# Patient Record
Sex: Female | Born: 1990 | Race: Black or African American | Hispanic: No | Marital: Single | Smoking: Current some day smoker
Health system: Southern US, Community
[De-identification: ages and names within clinical notes are randomized; demographics above are authoritative.]

## PROBLEM LIST (undated history)

## (undated) ENCOUNTER — Inpatient Hospital Stay (HOSPITAL_COMMUNITY): Payer: Self-pay

## (undated) ENCOUNTER — Emergency Department (HOSPITAL_COMMUNITY)
Discharge status: Absent without leave | Payer: Medicare Other | Attending: Emergency Medicine | Admitting: Emergency Medicine

## (undated) DIAGNOSIS — F329 Major depressive disorder, single episode, unspecified: Secondary | ICD-10-CM

## (undated) DIAGNOSIS — R0600 Dyspnea, unspecified: Secondary | ICD-10-CM

## (undated) DIAGNOSIS — D5700 Hb-SS disease with crisis, unspecified: Secondary | ICD-10-CM

## (undated) DIAGNOSIS — O039 Complete or unspecified spontaneous abortion without complication: Secondary | ICD-10-CM

## (undated) DIAGNOSIS — D571 Sickle-cell disease without crisis: Secondary | ICD-10-CM

## (undated) DIAGNOSIS — R4586 Emotional lability: Secondary | ICD-10-CM

## (undated) DIAGNOSIS — A6000 Herpesviral infection of urogenital system, unspecified: Secondary | ICD-10-CM

## (undated) DIAGNOSIS — D473 Essential (hemorrhagic) thrombocythemia: Secondary | ICD-10-CM

## (undated) DIAGNOSIS — G8929 Other chronic pain: Secondary | ICD-10-CM

## (undated) DIAGNOSIS — F633 Trichotillomania: Secondary | ICD-10-CM

## (undated) DIAGNOSIS — K219 Gastro-esophageal reflux disease without esophagitis: Secondary | ICD-10-CM

## (undated) DIAGNOSIS — M549 Dorsalgia, unspecified: Secondary | ICD-10-CM

## (undated) DIAGNOSIS — IMO0001 Reserved for inherently not codable concepts without codable children: Secondary | ICD-10-CM

## (undated) DIAGNOSIS — G43909 Migraine, unspecified, not intractable, without status migrainosus: Secondary | ICD-10-CM

## (undated) DIAGNOSIS — Z5189 Encounter for other specified aftercare: Secondary | ICD-10-CM

## (undated) DIAGNOSIS — R0609 Other forms of dyspnea: Secondary | ICD-10-CM

## (undated) HISTORY — DX: Hb-SS disease with crisis, unspecified: D57.00

---
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
## 2011-01-06 DIAGNOSIS — Z7189 Other specified counseling: Secondary | ICD-10-CM | POA: Insufficient documentation

## 2011-01-06 DIAGNOSIS — F329 Major depressive disorder, single episode, unspecified: Secondary | ICD-10-CM | POA: Insufficient documentation

## 2011-01-06 DIAGNOSIS — F32A Depression, unspecified: Secondary | ICD-10-CM

## 2011-01-06 HISTORY — DX: Depression, unspecified: F32.A

## 2011-01-06 NOTE — Progress Notes (Signed)
  Subjective:    Patient ID: [name], female    DOB: [birth year], 20 y.o.   MRN: [id number]  HPI Desired pregnancy- Pt states she has actively trying to get pregnant, has stopped smoking, has stopped taking hydroxyurea.  Has not discussed her plans for pregnancy with her heme/onc physician.  States that she just feels ready for a baby and that she wants her current boyfriend to be her baby's daddy.  Pt's boyfriend here with her at today's appointment.  Wanted to come discuss pregnancy planning and to get a pregnancy test.    cigarrette smoking- Stopped smoking x 1 month  Sickle cell- Saw heme/onc md on July 23rd- Dr. Mikel Cella.  Said no changes in regimen.  Did not discuss the fact that she has stopped hydroxyurea with md.  Has not had any lab checks with her heme/onc physician.  Last SS crisis in July 24th which led to a hospitalization.    Review of Systems    no fever.  No dizziness. No chest pain.   No body pain.  Objective:   Physical Exam  Constitutional: She is oriented to person, place, and time. She appears well-developed and well-nourished.  Cardiovascular: Normal rate and regular rhythm.   No murmur heard. Pulmonary/Chest: Effort normal and breath sounds normal. No respiratory distress. She has no wheezes.  Abdominal: Soft. She exhibits no distension.  Musculoskeletal: She exhibits no edema.  Neurological: She is alert and oriented to person, place, and time.  Skin: No rash noted.  Psychiatric: She has a normal mood and affect. Her behavior is normal.          Assessment & Plan:

## 2011-01-06 NOTE — Assessment & Plan Note (Signed)
Pt currently has stopped smoking since she is desiring pregnancy.

## 2011-01-06 NOTE — Assessment & Plan Note (Signed)
Pt encouraged to get a prenatal counseling appointment with Dr. Mikel Cella heme/onc.

## 2011-01-06 NOTE — Assessment & Plan Note (Signed)
Pt encouraged to delay pregnancy until SS disease better controlled.  Encouraged consult with her heme/onc physician to discuss pros/cons and ways to best manage pregnancy in the setting of sickle cell.  Also encouraged her to discuss her stopping hydroyurea with heme/onc physician.  Pt states she will call to get her appointment moved up from sept.  Also encouraged pt to think about if she is ready for the responsibility of pregnancy and the life changes and stress that this brings.  Encouraged her to take depo shot or other form of birth control until prenatal planning is complete.  Pt refuses.  Pt encouraged to take prenatal vitamin.

## 2011-01-14 ENCOUNTER — Telehealth: Payer: Self-pay | Admitting: *Deleted

## 2011-01-14 ENCOUNTER — Emergency Department (HOSPITAL_COMMUNITY)
Admission: EM | Admit: 2011-01-14 | Discharge: 2011-01-15 | Disposition: A | Discharge status: Other Acute Inpatient Hospital | Payer: Medicaid Other | Source: Home / Self Care | Attending: Emergency Medicine | Admitting: Emergency Medicine

## 2011-01-14 DIAGNOSIS — R0602 Shortness of breath: Secondary | ICD-10-CM | POA: Insufficient documentation

## 2011-01-14 DIAGNOSIS — O99891 Other specified diseases and conditions complicating pregnancy: Secondary | ICD-10-CM | POA: Insufficient documentation

## 2011-01-14 DIAGNOSIS — O99019 Anemia complicating pregnancy, unspecified trimester: Secondary | ICD-10-CM | POA: Insufficient documentation

## 2011-01-14 DIAGNOSIS — R51 Headache: Secondary | ICD-10-CM | POA: Insufficient documentation

## 2011-01-14 DIAGNOSIS — R109 Unspecified abdominal pain: Secondary | ICD-10-CM | POA: Insufficient documentation

## 2011-01-14 DIAGNOSIS — D571 Sickle-cell disease without crisis: Secondary | ICD-10-CM | POA: Insufficient documentation

## 2011-01-14 LAB — CBC
HCT: 24.3 % — ABNORMAL LOW (ref 36.0–46.0)
Hemoglobin: 8.5 g/dL — ABNORMAL LOW (ref 12.0–15.0)
RBC: 2.79 MIL/uL — ABNORMAL LOW (ref 3.87–5.11)
WBC: 13.8 10*3/uL — ABNORMAL HIGH (ref 4.0–10.5)

## 2011-01-14 LAB — COMPREHENSIVE METABOLIC PANEL
ALT: 11 U/L (ref 0–35)
Alkaline Phosphatase: 54 U/L (ref 39–117)
BUN: 7 mg/dL (ref 6–23)
CO2: 25 mEq/L (ref 19–32)
Chloride: 102 mEq/L (ref 96–112)
GFR calc Af Amer: >60 mL/min (ref >60)
Glucose, Bld: 79 mg/dL (ref 70–99)
Potassium: 3.6 mEq/L (ref 3.5–5.1)
Total Bilirubin: 2.2 mg/dL — ABNORMAL HIGH (ref 0.3–1.2)

## 2011-01-14 LAB — URINALYSIS, ROUTINE W REFLEX MICROSCOPIC
Bilirubin Urine: NEGATIVE
Leukocytes, UA: NEGATIVE
Nitrite: NEGATIVE
Specific Gravity, Urine: 1.011 (ref 1.005–1.030)
pH: 5.5 (ref 5.0–8.0)

## 2011-01-14 LAB — DIFFERENTIAL
Basophils Absolute: 0.1 10*3/uL (ref 0.0–0.1)
Lymphocytes Relative: 27 % (ref 12–46)
Monocytes Absolute: 1.6 10*3/uL — ABNORMAL HIGH (ref 0.1–1.0)
Neutro Abs: 8.1 10*3/uL — ABNORMAL HIGH (ref 1.7–7.7)
Neutrophils Relative %: 59 % (ref 43–77)

## 2011-01-14 LAB — POCT PREGNANCY, URINE: Preg Test, Ur: POSITIVE

## 2011-01-14 LAB — WET PREP, GENITAL: Yeast Wet Prep HPF POC: NONE SEEN

## 2011-01-14 NOTE — Telephone Encounter (Signed)
Message copied by Arlyss Repress on Fri Jan 14, 2011 11:44 AM ------      Message from: Lorita Officer      Created: Thu Jan 06, 2011  4:33 PM                   ----- Message -----         From: [name]         Sent: 01/06/2011   3:31 PM           To: [facility] Admin            Can you please request records from [facility]--  Dr. Mikel Cella for pt's last appointment in July.

## 2011-01-14 NOTE — Telephone Encounter (Signed)
Pt has upcoming appt with dr.caviness. i tried to call the pt several times, but none of her phones (3) accept incoming calls. Pt needs to sign ROI, so we can get records from [facility] Marland KitchenArlyss Repress

## 2011-01-15 ENCOUNTER — Inpatient Hospital Stay (HOSPITAL_COMMUNITY)
Admission: EM | Admit: 2011-01-15 | Discharge: 2011-01-17 | DRG: 781 | Disposition: A | Discharge status: Home / Self Care | Payer: Medicaid Other | Source: Ambulatory Visit | Attending: Family Medicine | Admitting: Family Medicine

## 2011-01-15 ENCOUNTER — Inpatient Hospital Stay (HOSPITAL_COMMUNITY): Payer: Medicaid Other

## 2011-01-15 ENCOUNTER — Emergency Department (HOSPITAL_COMMUNITY): Payer: Medicaid Other

## 2011-01-15 DIAGNOSIS — D5700 Hb-SS disease with crisis, unspecified: Secondary | ICD-10-CM | POA: Diagnosis present

## 2011-01-15 DIAGNOSIS — Z331 Pregnant state, incidental: Secondary | ICD-10-CM

## 2011-01-15 DIAGNOSIS — Z87891 Personal history of nicotine dependence: Secondary | ICD-10-CM

## 2011-01-15 DIAGNOSIS — Z832 Family history of diseases of the blood and blood-forming organs and certain disorders involving the immune mechanism: Secondary | ICD-10-CM

## 2011-01-15 DIAGNOSIS — O99019 Anemia complicating pregnancy, unspecified trimester: Principal | ICD-10-CM | POA: Diagnosis present

## 2011-01-15 LAB — CBC
HCT: 20.2 % — ABNORMAL LOW (ref 36.0–46.0)
HCT: 19.5 % — ABNORMAL LOW (ref 36.0–46.0)
Hemoglobin: 7 g/dL — ABNORMAL LOW (ref 12.0–15.0)
MCH: 29.7 pg (ref 26.0–34.0)
MCHC: 35.1 g/dL (ref 30.0–36.0)
MCV: 84.5 fL (ref 78.0–100.0)
Platelets: 487 10*3/uL — ABNORMAL HIGH (ref 150–400)
RDW: 18.1 % — ABNORMAL HIGH (ref 11.5–15.5)
RDW: 18.7 % — ABNORMAL HIGH (ref 11.5–15.5)
WBC: 13.2 10*3/uL — ABNORMAL HIGH (ref 4.0–10.5)

## 2011-01-15 LAB — CARDIAC PANEL(CRET KIN+CKTOT+MB+TROPI)
Relative Index: INVALID (ref 0.0–2.5)
Total CK: 62 U/L (ref 7–177)
Troponin I: <0.3 ng/mL (ref <0.30)

## 2011-01-15 LAB — BASIC METABOLIC PANEL
CO2: 25 mEq/L (ref 19–32)
Calcium: 8.3 mg/dL — ABNORMAL LOW (ref 8.4–10.5)
Chloride: 106 mEq/L (ref 96–112)
Creatinine, Ser: <0.47 mg/dL — ABNORMAL LOW (ref 0.50–1.10)
Glucose, Bld: 92 mg/dL (ref 70–99)

## 2011-01-15 LAB — COMPREHENSIVE METABOLIC PANEL
ALT: 11 U/L (ref 0–35)
Albumin: 3.7 g/dL (ref 3.5–5.2)
Alkaline Phosphatase: 50 U/L (ref 39–117)
BUN: 4 mg/dL — ABNORMAL LOW (ref 6–23)
Chloride: 105 mEq/L (ref 96–112)
Potassium: 3.4 mEq/L — ABNORMAL LOW (ref 3.5–5.1)
Sodium: 137 mEq/L (ref 135–145)
Total Bilirubin: 3.2 mg/dL — ABNORMAL HIGH (ref 0.3–1.2)
Total Protein: 6.4 g/dL (ref 6.0–8.3)

## 2011-01-15 LAB — LIPASE, BLOOD: Lipase: 26 U/L (ref 11–59)

## 2011-01-15 NOTE — H&P (Signed)
[name] is an 20 y.o. female.   Chief Complaint: Abdominal Pain Crisis HPI: [name] is 20 year old female with past medical history of Sickle cell anemia and multiple hospitalizations for pain crisis who presents with abdominal pain.  She says that the pain started yesterday afternoon, and she had nausea and vomiting.  By the time she was finishing her shift at [facility] last night, she was hurting badly, and went to the Emergency room.  She denies pain anywhere but in her abdomen, and says she has not had fevers or shortness of breath.  In the ER, the patient found out she was pregnant.  This was a planned pregnancy, she and her boyfriend have been trying to have a baby.  She is not taking any prenatal vitamins yet.  She is not smoking, using any illegal drugs, or drinking alcohol.  Patient says that her lower back hurts all the time, even when she is not having a pain crisis.  She says no injury to it, just feels sore often.    Past Medical History  Diagnosis Date  . Sickle cell anemia with crisis     Past Surgical History  Procedure Date  . Cholecystectomy     Family History: Other family members with trait, no one else has sickle cell.  Social History:  reports that she has quit smoking. Her smoking use included Cigars. She does not have any smokeless tobacco history on file. She reports that she does not drink alcohol or use illicit drugs.  Allergies:  Allergies  Allergen Reactions  . Toradol     Some itching, give with benedryl    No current facility-administered medications on file as of 01/15/2011.   Medications Prior to Admission  Medication Sig Dispense Refill  . buPROPion (WELLBUTRIN XL) 150 MG 24 hr tablet Take 1 tablet (150 mg total) by mouth every morning.  30 tablet  2  . norethindrone-ethinyl estradiol (ORTHO-NOVUM 1-35 TAB,NORTREL 1-35 TAB) 1-35 MG-MCG per tablet Take 1 tablet by mouth daily.          Results for orders placed during the hospital  encounter of 01/15/11 (from the past 48 hour(s))  CBC     Status: Abnormal   Collection Time   01/15/11 11:28 AM      Component Value Range Comment   WBC 11.7 (*) 4.0 - 10.5 (K/uL)    RBC 2.39 (*) 3.87 - 5.11 (MIL/uL)    Hemoglobin 7.1 (*) 12.0 - 15.0 (g/dL)    HCT 16.1 (*) 09.6 - 46.0 (%)    MCV 84.5  78.0 - 100.0 (fL)    MCH 29.7  26.0 - 34.0 (pg)    MCHC 35.1  30.0 - 36.0 (g/dL)    RDW 04.5 (*) 40.9 - 15.5 (%)    Platelets 487 (*) 150 - 400 (K/uL)   RETICULOCYTES     Status: Abnormal   Collection Time   01/15/11 11:28 AM      Component Value Range Comment   Retic Ct Pct 22.4 (*) 0.4 - 3.1 (%)    RBC. 2.39 (*) 3.87 - 5.11 (MIL/uL)    Retic Count, Manual 535.4 (*) 19.0 - 186.0 (K/uL)   BASIC METABOLIC PANEL     Status: Abnormal   Collection Time   01/15/11 11:28 AM      Component Value Range Comment   Sodium 138  135 - 145 (mEq/L)    Potassium 3.2 (*) 3.5 - 5.1 (mEq/L)    Chloride  106  96 - 112 (mEq/L)    CO2 25  19 - 32 (mEq/L)    Glucose, Bld 92  70 - 99 (mg/dL)    BUN 3 (*) 6 - 23 (mg/dL)    Creatinine, Ser <1.61 (*) 0.50 - 1.10 (mg/dL) ICTERUS AT THIS LEVEL MAY AFFECT RESULT   Calcium 8.3 (*) 8.4 - 10.5 (mg/dL)    GFR calc non Af Amer NOT CALCULATED  >60 (mL/min)    GFR calc Af Amer NOT CALCULATED  >60 (mL/min)    US Ob Transvaginal  01/15/2011  *RADIOLOGY REPORT*  Clinical Data: Left-sided pain.  Ectopic.  Negative pregnancy test on 12/31/2010.  TRANSVAGINAL OB ULTRASOUND  Technique:  Transvaginal ultrasound was performed for evaluation of the gestation as well as the maternal uterus and adnexal regions.  Comparison: 12/08/2010  Findings: Thickened endometrial stripe with decidual reaction noted.  There is a small intrauterine gestational sac demonstrated with a mean sac diameter of about 2.4 mm consistent with estimated gestational age of [redacted] weeks 6 days and estimated delivery date of 09/18/2011.  Small amount of fluid in the endometrium suggesting hemorrhage.  This may represent  implantation hemorrhage in a an early intrauterine gestation.  Fetal pole and yolk sac are not visualized, as the gestational sac is too small.  The left ovary measures 3 x 1.5 by 1.6 cm and contains normal follicular changes. The right ovary measures 3.9 x 2.5 by 1.9 cm and contains a dominant cyst measuring about 1.9 x 2.4 x 1.9 cm.  This likely represents the corpus luteum cyst.  Minimal free fluid in the pelvis.  IMPRESSION: Tiny intrauterine cyst with decidual reaction and small hemorrhage suggesting early pregnancy.  Estimated gestational age by sac size is 4 weeks 6 days.  The fetal pole and yolk sac are not visualized yet.  Follow-up with serial quantitative beta HCG levels may be useful in confirming normal early pregnancy.  Original Report Authenticated By: Marlon Pel, M.D.    Review of Systems  Constitutional: Negative for fever.  Eyes: Negative for blurred vision.  Respiratory: Negative for shortness of breath.   Cardiovascular: Negative for chest pain.  Gastrointestinal: Positive for nausea, vomiting, abdominal pain and constipation. Negative for blood in stool.  Genitourinary: Negative for frequency.  Musculoskeletal: Positive for back pain.  Skin: Negative for rash.  Neurological: Negative for dizziness and headaches.  Endo/Heme/Allergies: Does not bruise/bleed easily.  Psychiatric/Behavioral: Negative for depression.    Last menstrual period 12/14/2010. Physical Exam  Vitals reviewed. Constitutional: She is oriented to person, place, and time. She appears well-developed and well-nourished. She appears distressed.  HENT:  Head: Normocephalic.  Eyes: EOM are normal.  Neck: Neck supple. No JVD present. No thyromegaly present.  Cardiovascular: Normal rate, regular rhythm and normal heart sounds.   Respiratory: Effort normal and breath sounds normal.  GI: Soft. Bowel sounds are normal. She exhibits no mass. There is tenderness.  Musculoskeletal: She exhibits no edema  and no tenderness.  Lymphadenopathy:    She has no cervical adenopathy.  Neurological: She is alert and oriented to person, place, and time.  Skin: Skin is warm and dry. No rash noted.  Psychiatric: She has a normal mood and affect.     Assessment/Plan 20 year old female with pmhx of Sickle Cell anemia who presents with Sickle Cell pain crisis, found to be 4 weeks and 6 days pregnant.   1) Heme- Hemoglobin slighlty low, 7.1, but no indication to transfuse at this time.  Will continue monitoring. 2) Pain- Will try to minimize narcotics as much as possible in event of pain crisis.  Will give IV dilaudid PRN instead of a PCA.  No NSAIDS due to pregnancy. 3) Pregnancy- will give pregnancy safe medications as much as possible and try to minimize narcotics.  Start prenatal vitamin.  Will plan on referral to [facility] at the [facility] for outpatient management of Pregnancy.   4) Back pain- patient would likely benefit from physical therapy.  5) FEN/GI- will give NS for hydration in setting of vaso-occlusive pain crisis, will allow PO diet as tolerated, protonix, and zofran PRN. 6) DVT PPX- Lovonox 30 SQ BID 7) Disposition- pending pain crisis improvement.   CHAMBERLAIN,RACHEL 01/15/2011, 3:53 PM

## 2011-01-16 LAB — BASIC METABOLIC PANEL
BUN: 3 mg/dL — ABNORMAL LOW (ref 6–23)
CO2: 24 mEq/L (ref 19–32)
Calcium: 9 mg/dL (ref 8.4–10.5)
Creatinine, Ser: <0.47 mg/dL — ABNORMAL LOW (ref 0.50–1.10)
Glucose, Bld: 91 mg/dL (ref 70–99)
Sodium: 137 mEq/L (ref 135–145)

## 2011-01-16 LAB — CBC
HCT: 21 % — ABNORMAL LOW (ref 36.0–46.0)
Hemoglobin: 7.5 g/dL — ABNORMAL LOW (ref 12.0–15.0)
MCH: 30.2 pg (ref 26.0–34.0)
MCHC: 35.7 g/dL (ref 30.0–36.0)
MCV: 84.7 fL (ref 78.0–100.0)
RBC: 2.48 MIL/uL — ABNORMAL LOW (ref 3.87–5.11)

## 2011-01-17 LAB — CBC
MCH: 30.8 pg (ref 26.0–34.0)
MCHC: 35.7 g/dL (ref 30.0–36.0)
MCV: 86.3 fL (ref 78.0–100.0)
Platelets: 471 10*3/uL — ABNORMAL HIGH (ref 150–400)
RBC: 2.27 MIL/uL — ABNORMAL LOW (ref 3.87–5.11)
RDW: 19.8 % — ABNORMAL HIGH (ref 11.5–15.5)

## 2011-01-17 LAB — BASIC METABOLIC PANEL
CO2: 22 mEq/L (ref 19–32)
Calcium: 8.5 mg/dL (ref 8.4–10.5)
Creatinine, Ser: <0.47 mg/dL — ABNORMAL LOW (ref 0.50–1.10)
Sodium: 136 mEq/L (ref 135–145)

## 2011-01-17 IMAGING — CT CT ABD-PELV W/O CM
2 of 4 series · 14 of 32 positions shown, 19 images · IV contrast (water/omni)
Comparison: CT abdomen and pelvis 05/24/2006.

CLINICAL DATA: Possible sickle cell crisis.  Diffuse abdominal
pain, worst in the lower abdomen.  Nausea and vomiting.

CT ABDOMEN AND PELVIS WITHOUT CONTRAST/6755:
TECHNIQUE: Multidetector CT imaging of the abdomen and pelvis was
performed following the standard protocol without intravenous
contrast.

[Series 2: routine abdomen · axial · 0.88mm/px · z∈[-430,-86]mm · 7 of 93 slices shown, 12 images]
[im 12/93  soft-tissue]
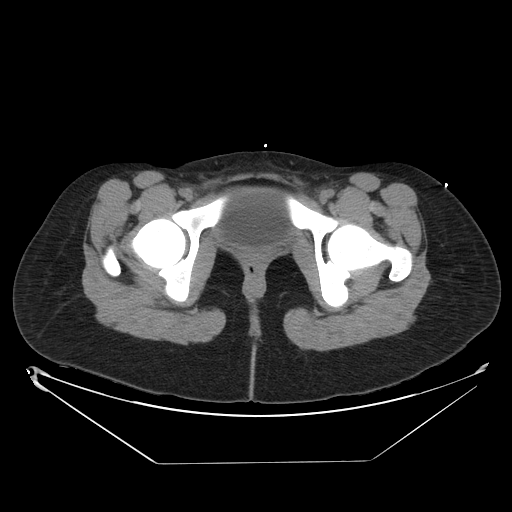
[im 12/93  bone]
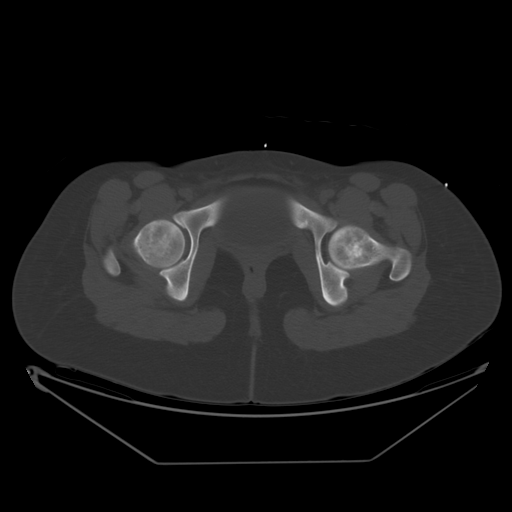
[im 24/93  soft-tissue]
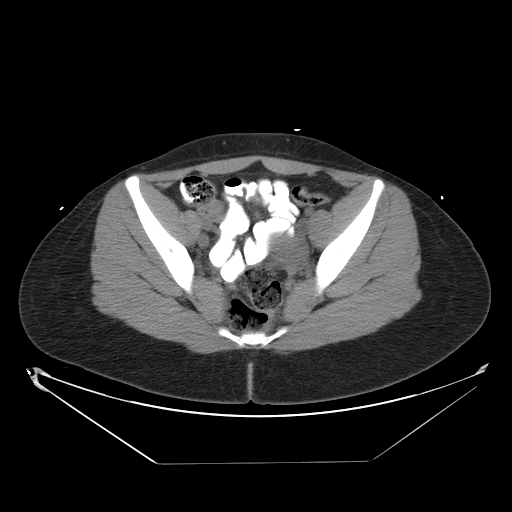
[im 35/93  soft-tissue]
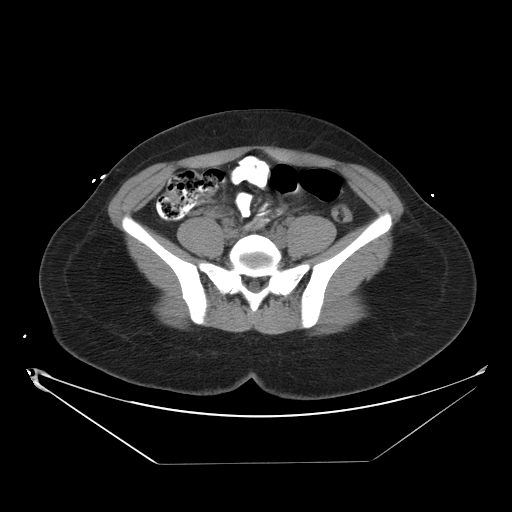
[im 47/93  soft-tissue]
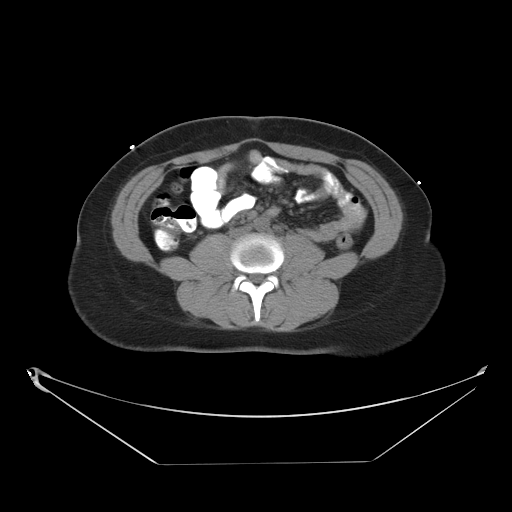
[im 47/93  lung]
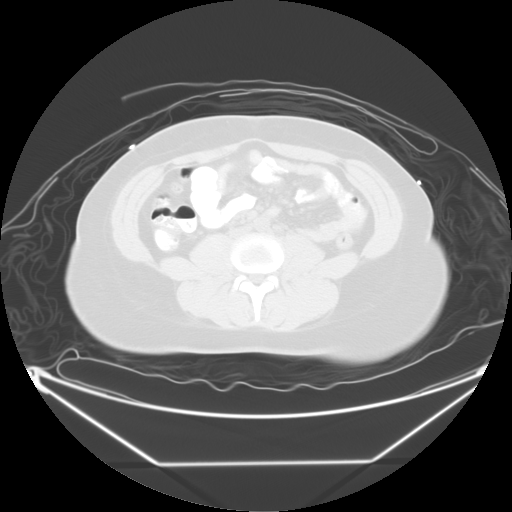
[im 58/93  soft-tissue]
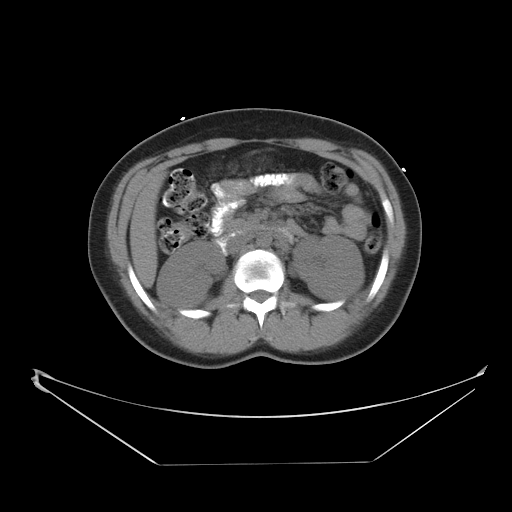
[im 58/93  lung]
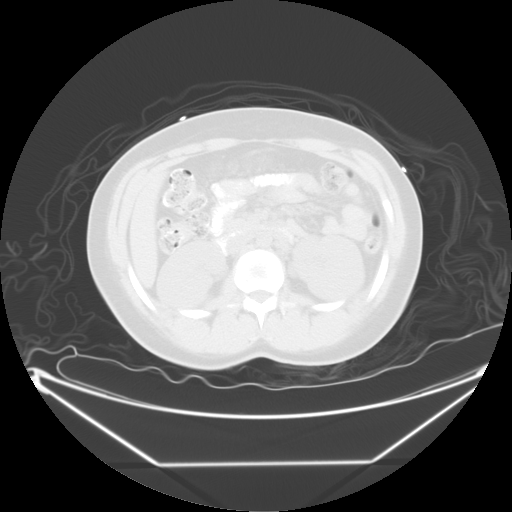
[im 70/93  soft-tissue]
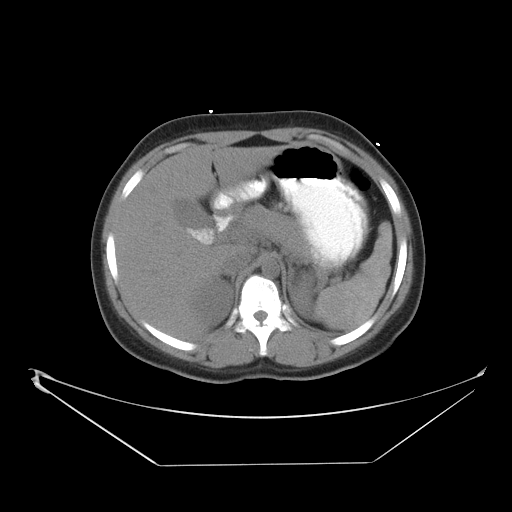
[im 70/93  lung]
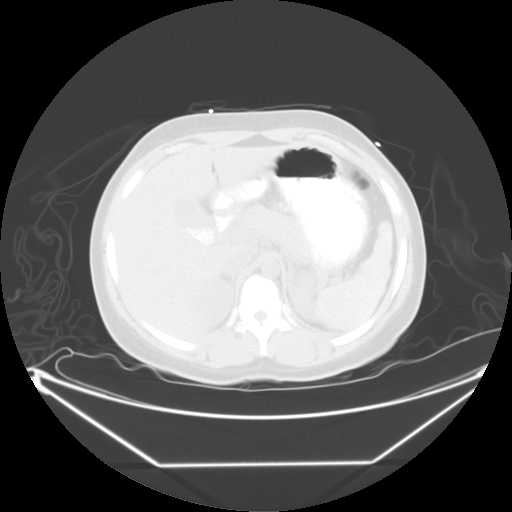
[im 81/93  soft-tissue]
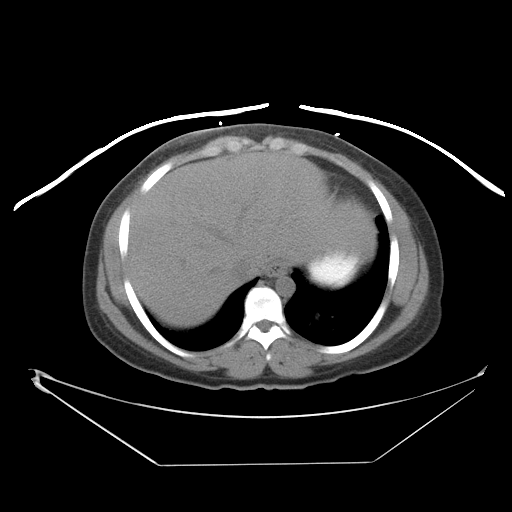
[im 81/93  lung]
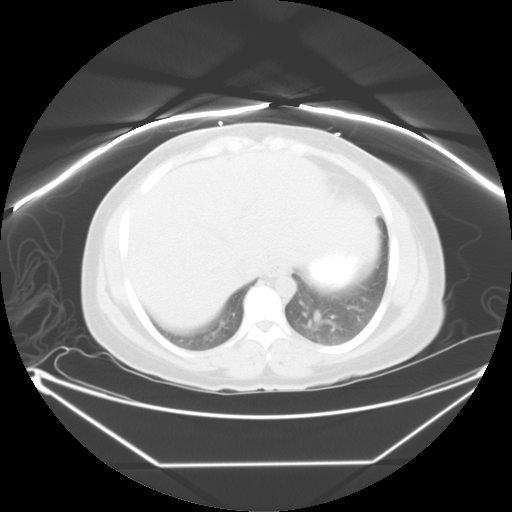

[Series 400: sag · sagittal · 0.92mm/px · 7 of 134 slices shown]
[im 13/134  soft-tissue]
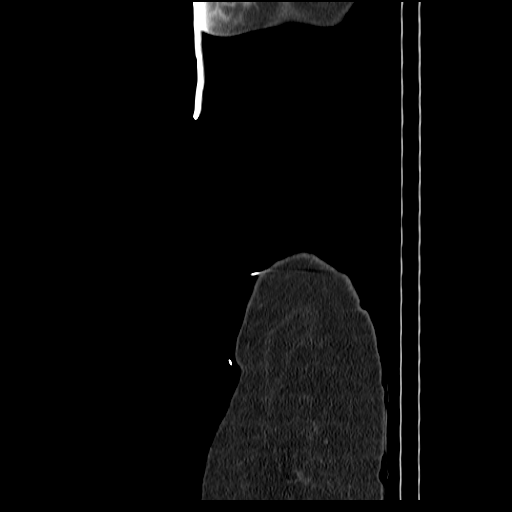
[im 25/134  soft-tissue]
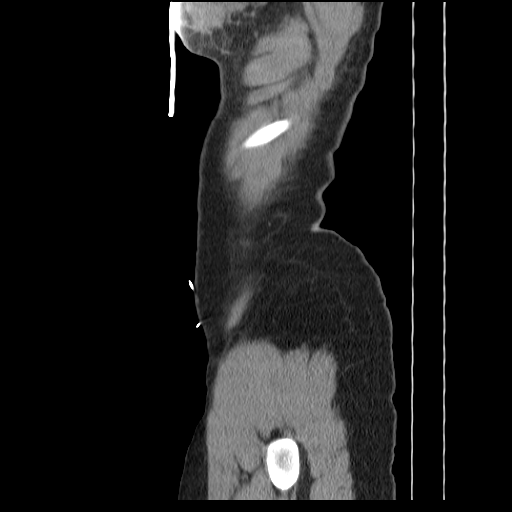
[im 49/134  soft-tissue]
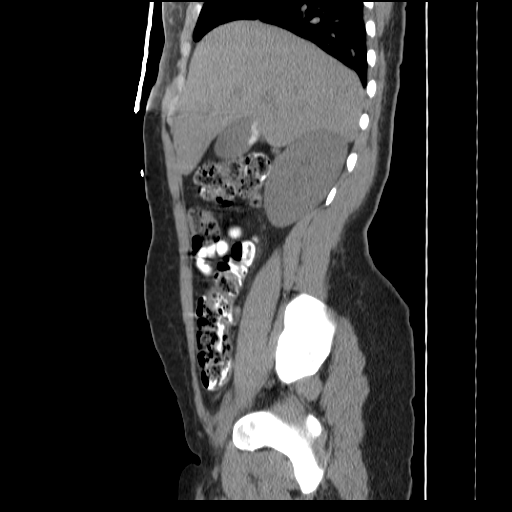
[im 61/134  soft-tissue]
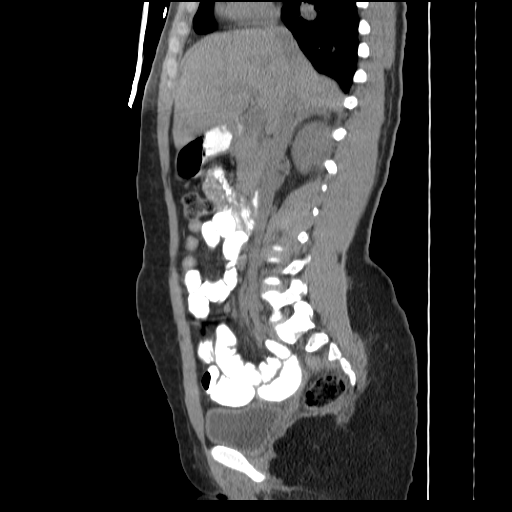
[im 73/134  soft-tissue]
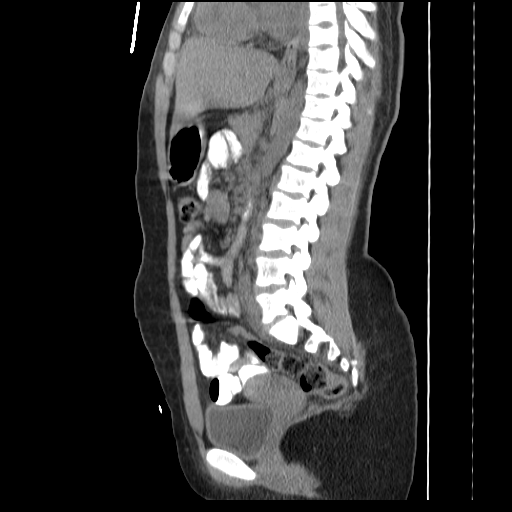
[im 85/134  soft-tissue]
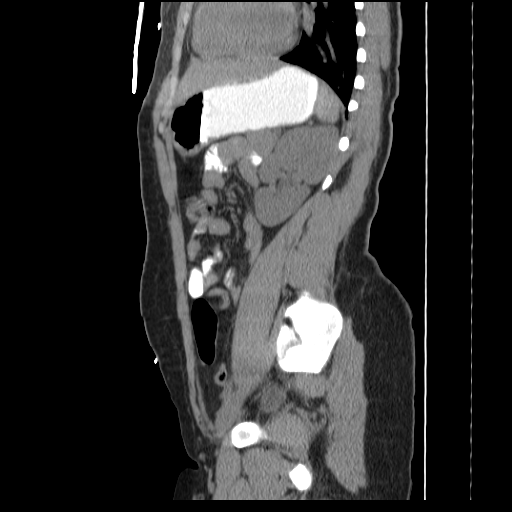
[im 109/134  soft-tissue]
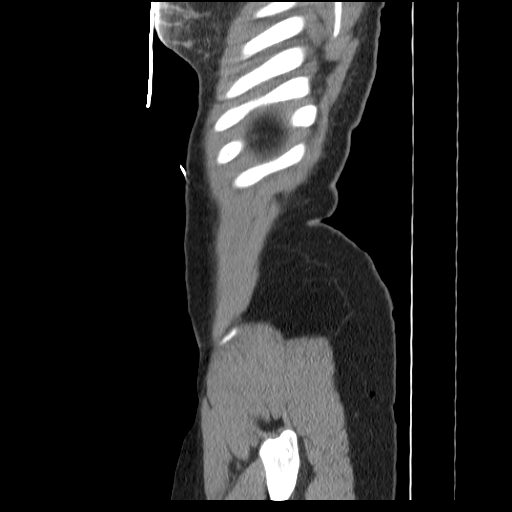

[14 of 32 positions shown; findings below may reference images not displayed]

FINDINGS: Normal low dose unenhanced appearance of the liver,
pancreas, adrenal glands, and kidneys.  Heterogeneous spleen with
irregular contour, consistent with old infarcts.  Gallstones within
the gallbladder which is otherwise unremarkable.  No biliary ductal
dilation.  No visible bile duct stones.  Stomach, small bowel, and
colon normal in appearance.  Normal appendix in the right mid
pelvis.  No ascites.  No significant lymphadenopathy.

Uterus and ovaries unremarkable for age.  Urinary bladder
unremarkable.  The visualized lung bases clear.  Heart size upper
normal.  Bone window images demonstrate diffuse sickle osteopathy.
IMPRESSION: 1.  No acute abnormalities involving the abdomen or pelvis.
2.  Cholelithiasis without CT evidence of acute cholecystitis.
3.  Irregular spleen consistent with old infarcts.
4.  Sickle osteopathy.

## 2011-01-17 IMAGING — CR DG ABDOMEN ACUTE W/ 1V CHEST
3 series · 3 of 3 positions shown · non-contrast
Comparison: 02/01/2008

CLINICAL DATA: Abdominal pain, sickle cell pain.

ACUTE ABDOMEN SERIES (ABDOMEN 2 VIEW & CHEST 1 VIEW)

[w chest pa]
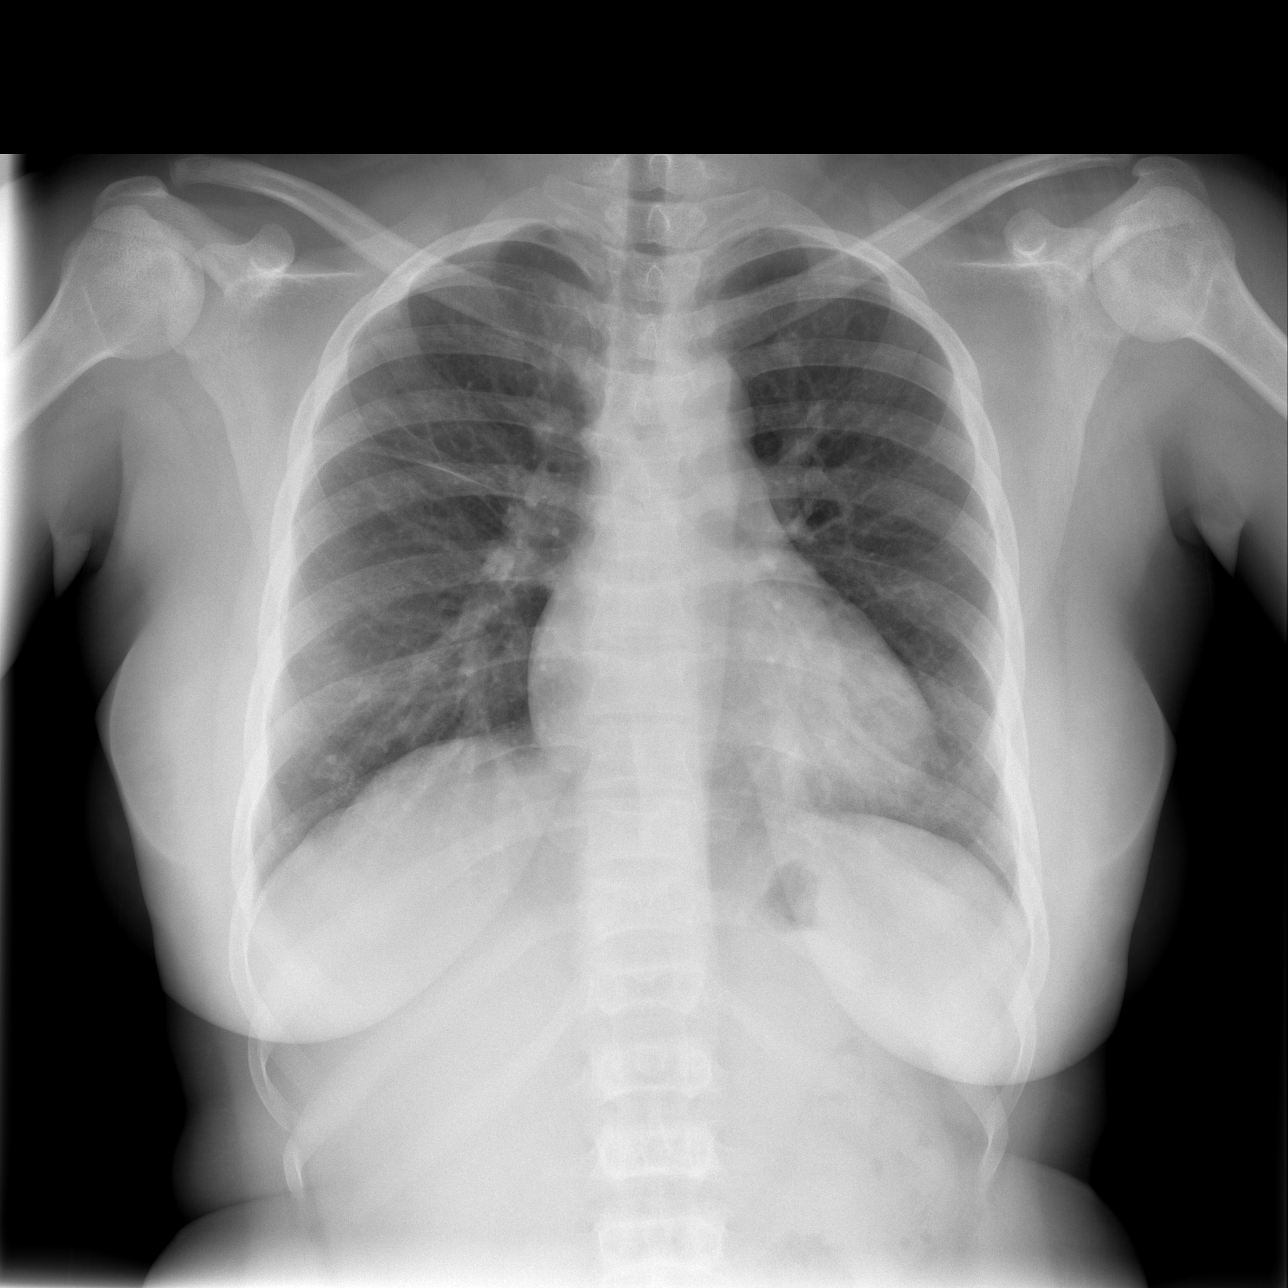

[w abdomen upright]
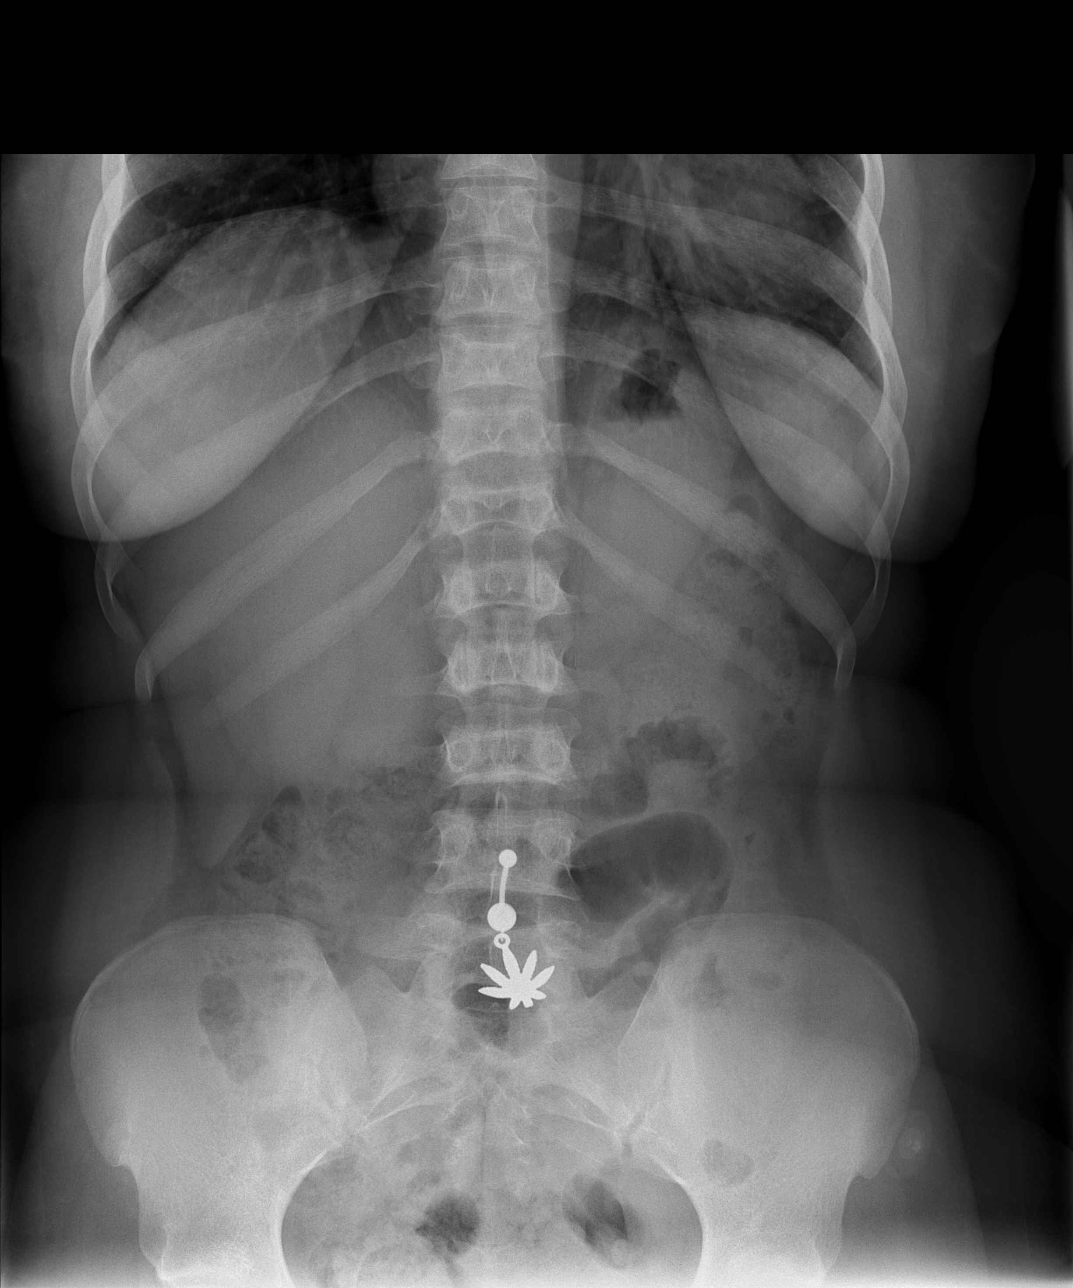

[t abdomen supine]
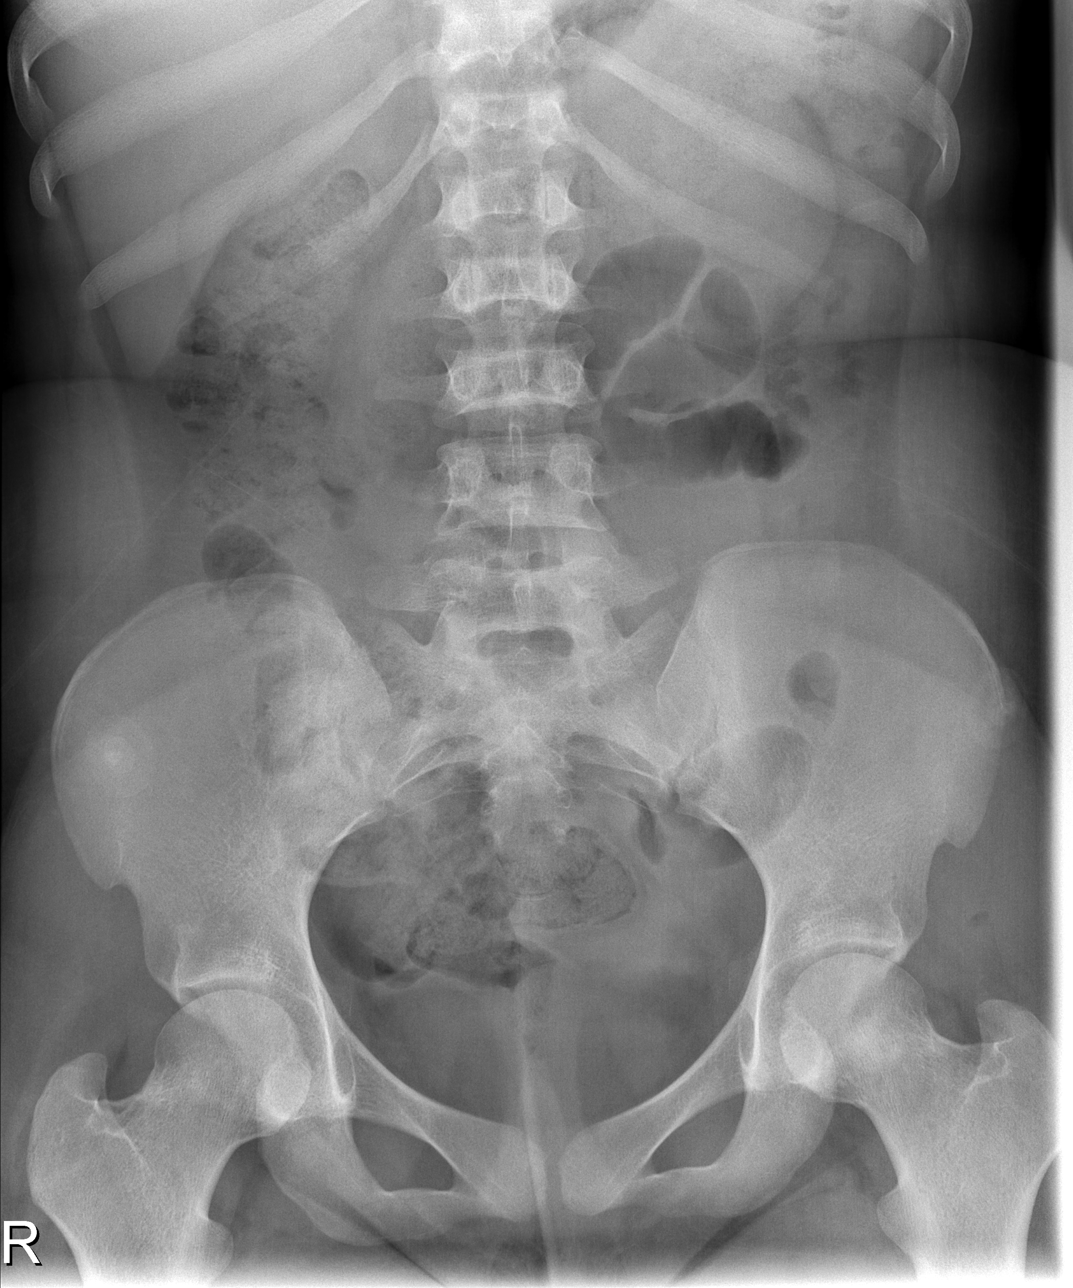

[3 of 3 positions shown; findings below may reference images not displayed]

FINDINGS: Cardiomediastinal silhouette is unremarkable.  No acute
infiltrate or pleural effusion.  No pulmonary edema.  No free
abdominal air.  Stool noted in the right colon.  Mild gaseous
distended small bowel loops in left abdomen suspicious for ileus or
early small bowel obstruction.
IMPRESSION: No acute disease within chest.  Mild gaseous distended small bowel
loops in left abdomen suspicious for ileus or early bowel
obstruction.

## 2011-01-19 IMAGING — CR DG ABDOMEN 2V
1 series · 1 of 1 positions shown · non-contrast
Comparison: CT scan 03/04/2010

CLINICAL DATA: Abdominal pain.  Sickle cell disease.

ABDOMEN - 2 VIEW

[w abdomen upright]
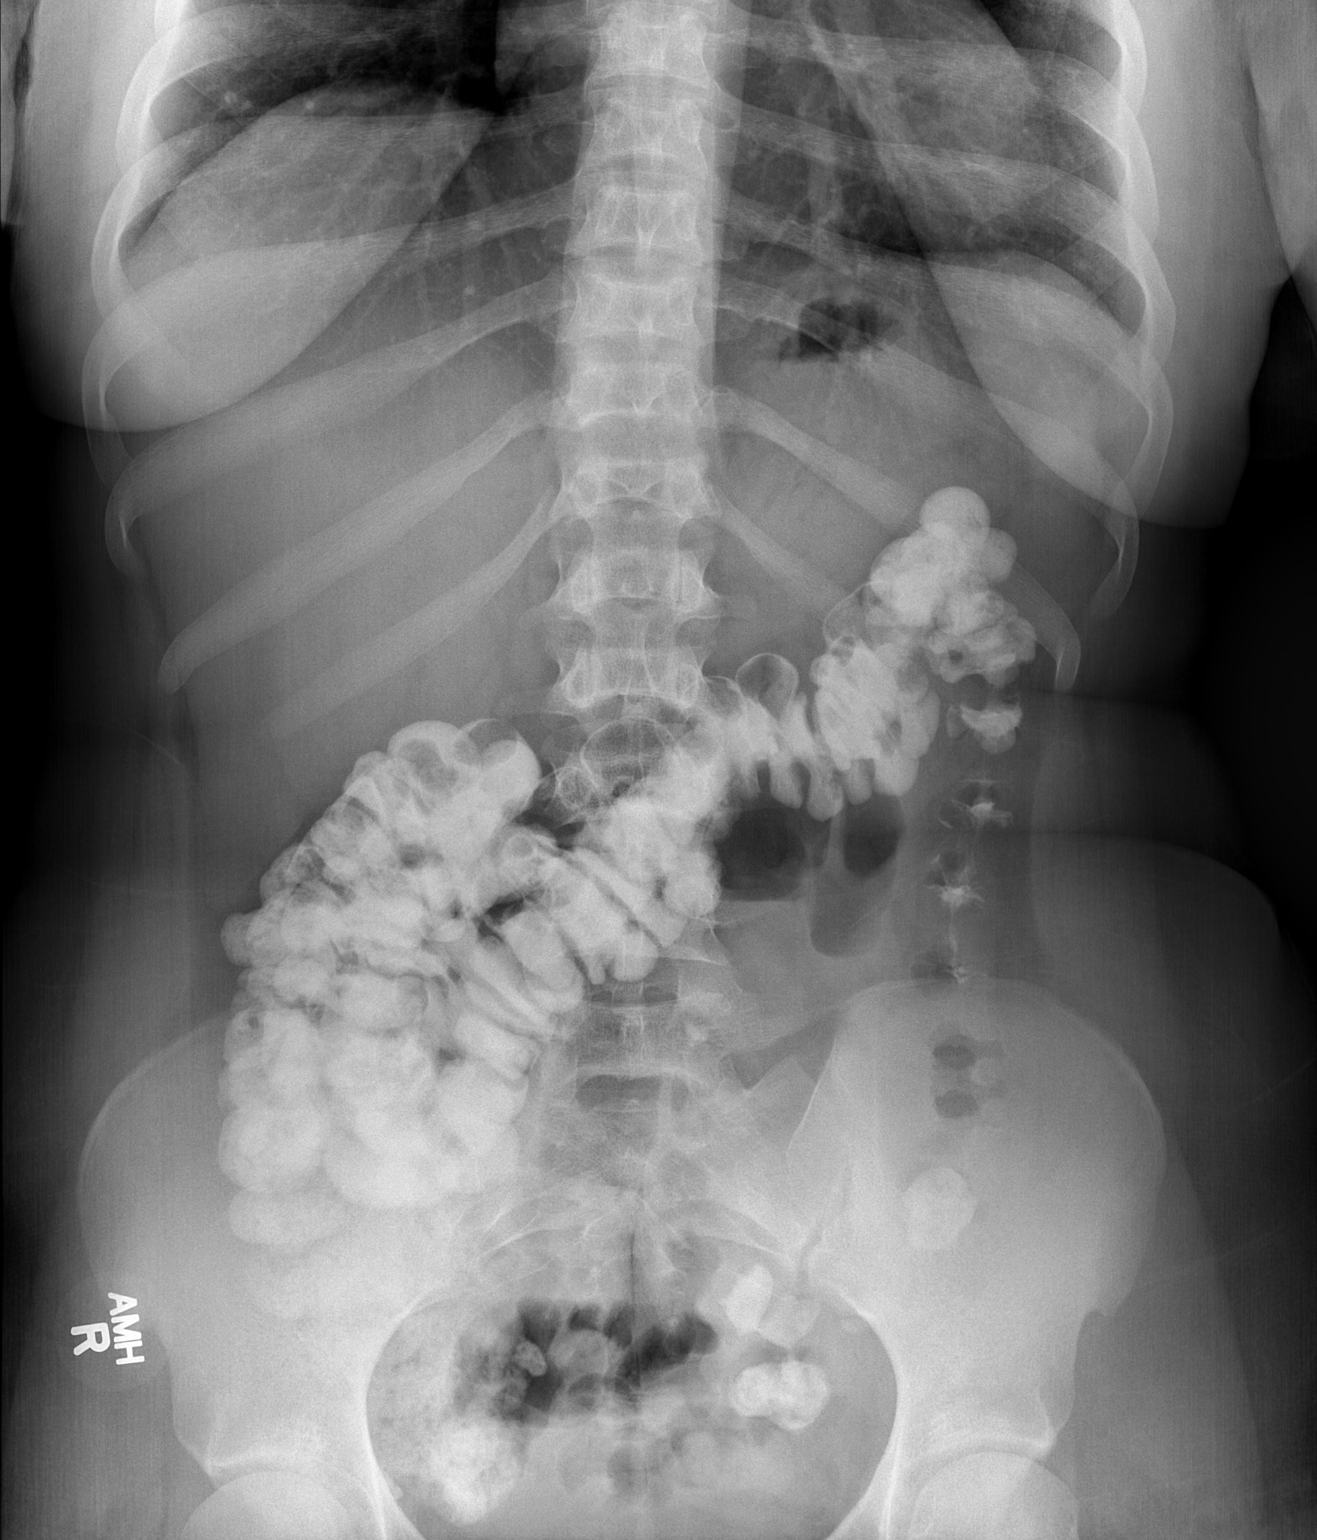

[1 of 1 positions shown; findings below may reference images not displayed]

FINDINGS: There is contrast throughout the colon from the recent CT
scan.  No dilated small bowel loops to suggest obstruction.  The
soft tissue shadows are maintained.  The bony structures are
unremarkable.  The visualized lung bases are clear.
IMPRESSION: No plain film findings for an acute abdominal process.

## 2011-01-19 IMAGING — CR DG ABDOMEN 2V
1 series · 1 of 1 positions shown · non-contrast
Comparison: CT scan 03/04/2010

CLINICAL DATA: Abdominal pain.  Sickle cell disease.

ABDOMEN - 2 VIEW

[t abdomen supine]
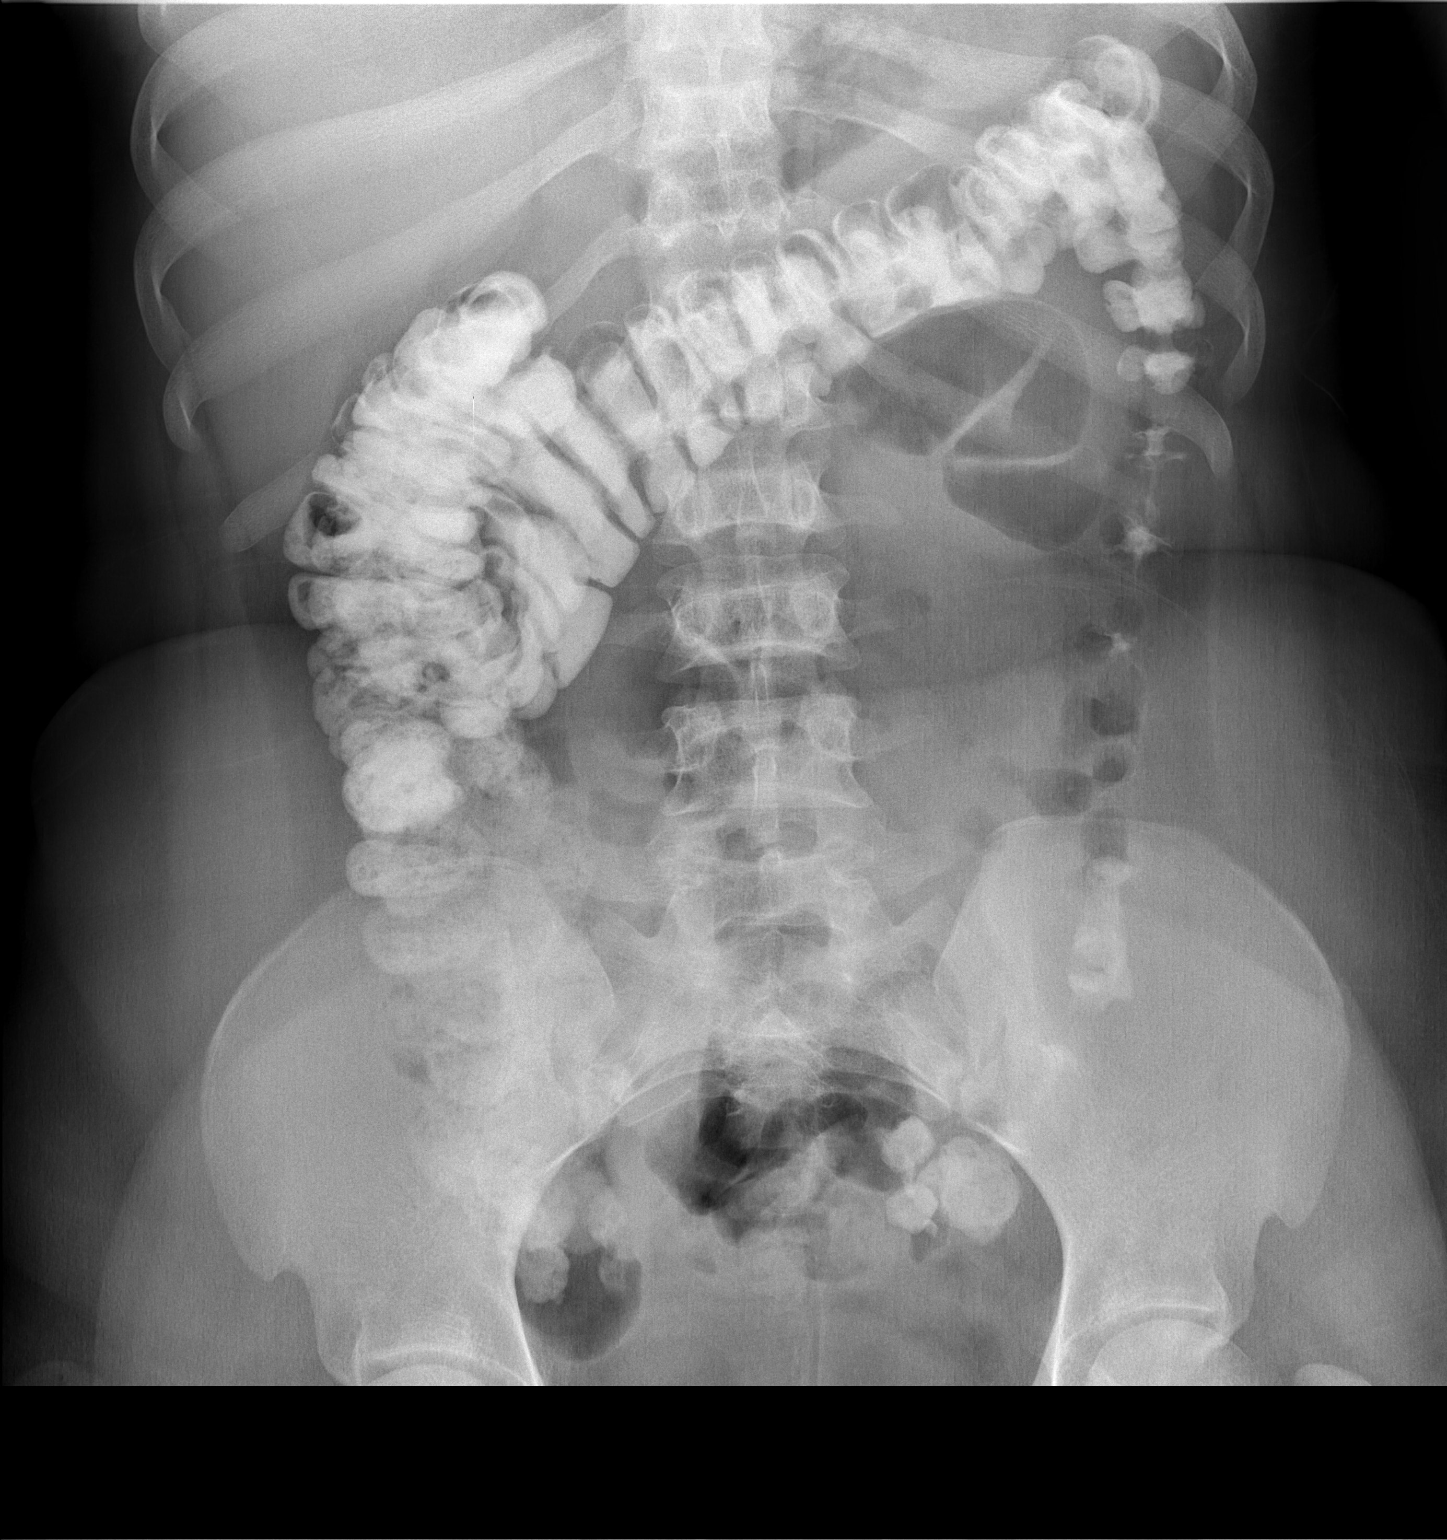

[1 of 1 positions shown; findings below may reference images not displayed]

FINDINGS: There is contrast throughout the colon from the recent CT
scan.  No dilated small bowel loops to suggest obstruction.  The
soft tissue shadows are maintained.  The bony structures are
unremarkable.  The visualized lung bases are clear.
IMPRESSION: No plain film findings for an acute abdominal process.

## 2011-01-19 NOTE — Discharge Summary (Signed)
NAME:  [name], JONS NO.:  [id number]  MEDICAL RECORD NO.:  [id number]  LOCATION:  3309                         FACILITY:  [facility]  PHYSICIAN:  Nestor Ramp, MD        DATE OF BIRTH:  [birth year]  DATE OF ADMISSION:  01/15/2011 DATE OF DISCHARGE:  01/17/2011                              DISCHARGE SUMMARY   PRIMARY CARE PHYSICIAN:  [name], MD at [facility].  CONSULTATIONS:  None.  DISCHARGE DIAGNOSES: 1. Sickle cell pain crisis. 2. Pregnancy.  SECONDARY DIAGNOSES: 1. Sickle cell SS disease. 2. History of tobacco use.  The patient states that she stopped     smoking. 3. History of acute chest syndrome. 4. History of cholecystectomy.  DISCHARGE MEDICATIONS: 1. Folic acid 4 mg daily (due to high risk pregnancy). 2. Oxycodone IR 15 mg take 1 tablet p.o. q4-6 hours as needed for pain. 3. MiraLax 17 g by mouth twice daily until constipation improved      then as needed.  HOME MEDICATIONS:  The patient states she has not taken any medications at home.  PROCEDURES:  Pelvic ultrasound indicated a tiny intrauterine cyst decidual reaction and small hemorrhage suggestive of early pregnancy, estimated gestational age by sac size is 4 weeks and 6 days.  Follow up with serial quantitative beta-hCG levels may be useful in confirming normal early pregnancy.  LABS AT DISCHARGE:  Hemoglobin 7.0, platelets 461 (hemoglobin trends since admission 8.5 to 7.1 to 7.5 to 7.0.).  BRIEF HOSPITAL COURSE:  This is a 20 year old female with a past medical history of sickle cell anemia and multiple hospitalizations from pain crises.  She presented on September 1 in the emergency department for abdominal pain.  At that time, she stated her pain was at 10/10; denied any fevers or chills.  In the emergency department, urine pregnancy test was positive.  The patient states that this was a planned pregnancy.  Since she was trying to get pregnant, she self  weaned her hydroxyurea but she has not been taking prenatal vitamins.  The patient does have a history of smoking which she states she stopped a couple of weeks ago.  Throughout her hospital course her pain was well controlled on IV Dilaudid just as needed rather than use of PCA and NSAIDS were avoided during this hospital course due to pregnancy.  The patient states that at the time of discharge her pain was much better.  For her pregnancy, the patient was started on a prenatal vitamin.  Due to the high risk nature of her pregnancy, she was discharged home on 4 mg of folic acid daily, which I have asked her to take to her high risk OB appointment.  The [facility] was closed on the day of discharge due to holiday.  The patient was given the phone number and was asked to make her own appointment for that.  The patient is aware to not take NSAIDS at home and to continue to take her folic acid daily.  The patient did not have any back pain complaints during this admission, but if she does have back pain in the future, physical therapy may  be beneficial for her.  DISPOSITION:  Home.  DISCHARGE FOLLOWUP:  The patient has an appointment with Dr. [name] at [facility] which was previously scheduled and she states that she would like to keep that appointment.  The patient will also contact [facility] to make an appointment for her pregnancy.  FOLLOWUP ISSUES: 1. Please follow up pain control.  The patient is comfortable at     discharge given oxycodone IR as needed for pain. 2. Please follow up pregnancy, it is recommended that she has HCG     quants on January 21, 2011.  She will also be following up at a     High Risk OB Clinic. 3. Please repeat a CBC to monitor for improvement of hemoglobin.  She     was 7.0 the day of discharge. 4. The patient discontinued her hydroxyurea due to planned pregnancy.      If she needs to follow up with [facility], please make     the patient aware of this.    ______________________________ Rodman Pickle, MD   ______________________________ Nestor Ramp, MD    AH/MEDQ  D:  01/17/2011  T:  01/17/2011  Job:  [id number]  Electronically Signed by Rodman Pickle  on 01/17/2011 05:53:05 PM Electronically Signed by Denny Levy MD on 01/19/2011 03:10:39 PM

## 2011-01-25 ENCOUNTER — Inpatient Hospital Stay (HOSPITAL_COMMUNITY)
Admission: EM | Admit: 2011-01-25 | Discharge: 2011-01-29 | DRG: 781 | Disposition: A | Discharge status: Home / Self Care | Payer: Medicaid Other | Source: Ambulatory Visit | Attending: Family Medicine | Admitting: Family Medicine

## 2011-01-25 ENCOUNTER — Inpatient Hospital Stay (EMERGENCY_DEPARTMENT_HOSPITAL)
Admission: AD | Admit: 2011-01-25 | Discharge: 2011-01-25 | Disposition: A | Discharge status: Home / Self Care | Payer: Medicaid Other | Source: Ambulatory Visit | Attending: Obstetrics & Gynecology | Admitting: Obstetrics & Gynecology

## 2011-01-25 ENCOUNTER — Encounter (HOSPITAL_COMMUNITY): Payer: Self-pay | Admitting: *Deleted

## 2011-01-25 ENCOUNTER — Inpatient Hospital Stay (HOSPITAL_COMMUNITY): Payer: Medicaid Other

## 2011-01-25 DIAGNOSIS — D72829 Elevated white blood cell count, unspecified: Secondary | ICD-10-CM

## 2011-01-25 DIAGNOSIS — D5700 Hb-SS disease with crisis, unspecified: Secondary | ICD-10-CM | POA: Diagnosis present

## 2011-01-25 DIAGNOSIS — G479 Sleep disorder, unspecified: Secondary | ICD-10-CM | POA: Diagnosis present

## 2011-01-25 DIAGNOSIS — F17200 Nicotine dependence, unspecified, uncomplicated: Secondary | ICD-10-CM | POA: Diagnosis present

## 2011-01-25 DIAGNOSIS — R110 Nausea: Secondary | ICD-10-CM | POA: Diagnosis present

## 2011-01-25 DIAGNOSIS — O3680X0 Pregnancy with inconclusive fetal viability, not applicable or unspecified: Secondary | ICD-10-CM

## 2011-01-25 DIAGNOSIS — O99019 Anemia complicating pregnancy, unspecified trimester: Principal | ICD-10-CM | POA: Diagnosis present

## 2011-01-25 DIAGNOSIS — O9989 Other specified diseases and conditions complicating pregnancy, childbirth and the puerperium: Secondary | ICD-10-CM | POA: Diagnosis present

## 2011-01-25 HISTORY — DX: Sickle-cell disease without crisis: D57.1

## 2011-01-25 LAB — BASIC METABOLIC PANEL
CO2: 25 mEq/L (ref 19–32)
Calcium: 9.2 mg/dL (ref 8.4–10.5)
Calcium: 9.3 mg/dL (ref 8.4–10.5)
Glucose, Bld: 99 mg/dL (ref 70–99)
Potassium: 4.1 mEq/L (ref 3.5–5.1)
Potassium: 3.5 mEq/L (ref 3.5–5.1)
Sodium: 134 mEq/L — ABNORMAL LOW (ref 135–145)
Sodium: 137 mEq/L (ref 135–145)

## 2011-01-25 LAB — DIFFERENTIAL
Basophils Absolute: 0.1 10*3/uL (ref 0.0–0.1)
Basophils Absolute: 0.2 10*3/uL — ABNORMAL HIGH (ref 0.0–0.1)
Eosinophils Absolute: 0.5 10*3/uL (ref 0.0–0.7)
Eosinophils Absolute: 0.3 10*3/uL (ref 0.0–0.7)
Eosinophils Relative: 3 % (ref 0–5)
Lymphocytes Relative: 26 % (ref 12–46)
Lymphs Abs: 2.8 10*3/uL (ref 0.7–4.0)
Monocytes Absolute: 1.7 10*3/uL — ABNORMAL HIGH (ref 0.1–1.0)
Monocytes Relative: 11 % (ref 3–12)
Neutro Abs: 10.3 10*3/uL — ABNORMAL HIGH (ref 1.7–7.7)
Neutrophils Relative %: 60 % (ref 43–77)

## 2011-01-25 LAB — URINALYSIS, ROUTINE W REFLEX MICROSCOPIC
Leukocytes, UA: NEGATIVE
Nitrite: NEGATIVE
Protein, ur: NEGATIVE mg/dL
Specific Gravity, Urine: <1.005 — ABNORMAL LOW (ref 1.005–1.030)
Urobilinogen, UA: 0.2 mg/dL (ref 0.0–1.0)

## 2011-01-25 LAB — CBC
MCH: 30.9 pg (ref 26.0–34.0)
MCH: 29.7 pg (ref 26.0–34.0)
MCV: 89.3 fL (ref 78.0–100.0)
MCV: 86.5 fL (ref 78.0–100.0)
Platelets: 526 10*3/uL — ABNORMAL HIGH (ref 150–400)
Platelets: 580 10*3/uL — ABNORMAL HIGH (ref 150–400)
RBC: 2.33 MIL/uL — ABNORMAL LOW (ref 3.87–5.11)
RBC: 2.59 MIL/uL — ABNORMAL LOW (ref 3.87–5.11)
RDW: 18.6 % — ABNORMAL HIGH (ref 11.5–15.5)
WBC: 15.4 10*3/uL — ABNORMAL HIGH (ref 4.0–10.5)

## 2011-01-25 MED ORDER — LORAZEPAM 2 MG/ML IJ SOLN
1.0000 mg | Freq: Once | INTRAMUSCULAR | Status: AC
  Administered 2011-01-25: 1 mg via INTRAVENOUS
  Filled 2011-01-25: qty 1

## 2011-01-25 MED ORDER — LACTATED RINGERS IV SOLN
INTRAVENOUS | Status: DC
  Administered 2011-01-25: 02:00:00 via INTRAVENOUS

## 2011-01-25 MED ORDER — HYDROMORPHONE HCL 2 MG/ML IJ SOLN: 2.0000 mg | Freq: Once | INTRAMUSCULAR | Status: DC

## 2011-01-25 MED ORDER — HYDROMORPHONE HCL 1 MG/ML IJ SOLN
2.0000 mg | Freq: Once | INTRAMUSCULAR | Status: DC
  Administered 2011-01-25: 2 mg via INTRAVENOUS
  Filled 2011-01-25: qty 2

## 2011-01-25 MED ORDER — OXYCODONE-ACETAMINOPHEN 5-325 MG PO TABS: 1.0000 | ORAL_TABLET | Freq: Four times a day (QID) | ORAL | Status: AC | PRN

## 2011-01-25 MED ORDER — OXYCODONE-ACETAMINOPHEN 5-325 MG PO TABS
2.0000 | ORAL_TABLET | Freq: Once | ORAL | Status: AC
  Administered 2011-01-25: 2 via ORAL
  Filled 2011-01-25: qty 2

## 2011-01-25 NOTE — ED Notes (Signed)
Medicated pt with percocet as ordered. Started giving d/c instructions and pt stated she did not want to go home yet and needed to talk to CNM again. (MIdwife had just left pt after talking with her and S.O for 10-15 min about POC and RX given for pain) Notified CNM and she went to talk to pt again. Pt agreeable to leaving now .

## 2011-01-25 NOTE — Progress Notes (Signed)
Pt returned to bed.  States pain is much improved.  Warm blanket and extra pillow provided.

## 2011-01-25 NOTE — Progress Notes (Signed)
Pt up to bathroom at this time for urine collection.

## 2011-01-25 NOTE — Progress Notes (Signed)
Pt states her whole upper body hurts.

## 2011-01-25 NOTE — ED Provider Notes (Signed)
History   The patient is a 20 y.o. year old G1P0 female at [redacted]w[redacted]d weeks gestation who presents to [facility] reporting sickle cell crisis. Pain is primarily bilat low back and epigastric, but also throughout her entires abd. [place] on 01/15/11 showed an 4.6 week intrauterine GS.   Chief Complaint  Patient presents with  . Sickle Cell Pain Crisis   HPI  OB History    Grav Para Term Preterm Abortions TAB SAB Ect Mult Living   1               Past Medical History  Diagnosis Date  . Sickle cell anemia with crisis   . Sickle cell anemia     Past Surgical History  Procedure Date  . Cholecystectomy     No family history on file.  History  Substance Use Topics  . Smoking status: Former Smoker    Types: Cigars  . Smokeless tobacco: Not on file  . Alcohol Use: No    Allergies:  Allergies  Allergen Reactions  . Toradol     Some itching, give with benedryl    Prescriptions prior to admission  Medication Sig Dispense Refill  . folic acid (FOLVITE) 1 MG tablet Take 1 mg by mouth daily.        . prenatal vitamin w/FE, FA (PRENATAL 1 + 1) 27-1 MG TABS Take 1 tablet by mouth daily.        Marland Kitchen buPROPion (WELLBUTRIN XL) 150 MG 24 hr tablet Take 1 tablet (150 mg total) by mouth every morning.  30 tablet  2  . norethindrone-ethinyl estradiol (ORTHO-NOVUM 1-35 TAB,NORTREL 1-35 TAB) 1-35 MG-MCG per tablet Take 1 tablet by mouth daily.          Review of Systems  Constitutional: Negative.   HENT: Negative for congestion and sore throat.   Respiratory: Negative for cough.   Gastrointestinal: Negative.   Genitourinary: Negative.   Musculoskeletal: Positive for back pain.   Physical Exam   Blood pressure 100/61, pulse 96, resp. rate 18, last menstrual period 12/14/2010.  Physical Exam  Constitutional: She is oriented to person, place, and time. She appears well-developed and well-nourished. She appears distressed.       Pt much more uncomfortable appearing when FOB is in room. NAD after  dilaudid.  Cardiovascular: Normal rate.   Respiratory: Effort normal and breath sounds normal.  GI: Soft. Normal appearance and bowel sounds are normal. She exhibits no distension and no mass. There is generalized tenderness. There is guarding. There is no rigidity, no rebound and no CVA tenderness.  Musculoskeletal: Normal range of motion.       Lumbar back: She exhibits tenderness and pain. She exhibits no swelling, no edema and no deformity.       Back:  Neurological: She is alert and oriented to person, place, and time.  Skin: Skin is warm and dry. She is not diaphoretic.   Results for orders placed during the hospital encounter of 01/25/11 (from the past 24 hour(s))  CBC     Status: Abnormal   Collection Time   01/25/11  1:40 AM      Component Value Range   WBC 15.4 (*) 4.0 - 10.5 (K/uL)   RBC 2.33 (*) 3.87 - 5.11 (MIL/uL)   Hemoglobin 7.2 (*) 12.0 - 15.0 (g/dL)   HCT 57.8 (*) 46.9 - 46.0 (%)   MCV 89.3  78.0 - 100.0 (fL)   MCH 30.9  26.0 - 34.0 (pg)   MCHC 34.6  30.0 - 36.0 (g/dL)   RDW 16.1 (*) 09.6 - 15.5 (%)   Platelets 526 (*) 150 - 400 (K/uL)  DIFFERENTIAL     Status: Abnormal   Collection Time   01/25/11  1:40 AM      Component Value Range   Neutrophils Relative 60  43 - 77 (%)   Neutro Abs 9.2 (*) 1.7 - 7.7 (K/uL)   Lymphocytes Relative 26  12 - 46 (%)   Lymphs Abs 4.1 (*) 0.7 - 4.0 (K/uL)   Monocytes Relative 10  3 - 12 (%)   Monocytes Absolute 1.5 (*) 0.1 - 1.0 (K/uL)   Eosinophils Relative 3  0 - 5 (%)   Eosinophils Absolute 0.5  0.0 - 0.7 (K/uL)   Basophils Relative 1  0 - 1 (%)   Basophils Absolute 0.1  0.0 - 0.1 (K/uL)  BASIC METABOLIC PANEL     Status: Abnormal   Collection Time   01/25/11  1:40 AM      Component Value Range   Sodium 134 (*) 135 - 145 (mEq/L)   Potassium 4.1  3.5 - 5.1 (mEq/L)   Chloride 100  96 - 112 (mEq/L)   CO2 25  19 - 32 (mEq/L)   Glucose, Bld 106 (*) 70 - 99 (mg/dL)   BUN 8  6 - 23 (mg/dL)   Creatinine, Ser <0.45 (*) 0.50 -  1.10 (mg/dL)   Calcium 9.2  8.4 - 40.9 (mg/dL)   GFR calc non Af Amer NOT CALCULATED  >60 (mL/min)   GFR calc Af Amer NOT CALCULATED  >60 (mL/min)  URINALYSIS, ROUTINE W REFLEX MICROSCOPIC     Status: Abnormal   Collection Time   01/25/11  2:40 AM      Component Value Range   Color, Urine YELLOW  YELLOW    Appearance CLEAR  CLEAR    Specific Gravity, Urine <1.005 (*) 1.005 - 1.030    pH 6.0  5.0 - 8.0    Glucose, UA NEGATIVE  NEGATIVE (mg/dL)   Hgb urine dipstick NEGATIVE  NEGATIVE    Bilirubin Urine NEGATIVE  NEGATIVE    Ketones, ur NEGATIVE  NEGATIVE (mg/dL)   Protein, ur NEGATIVE  NEGATIVE (mg/dL)   Urobilinogen, UA 0.2  0.0 - 1.0 (mg/dL)   Nitrite NEGATIVE  NEGATIVE    Leukocytes, UA NEGATIVE  NEGATIVE    *UA collected after IV bolus  US Ob Transvaginal  01/25/2011  *RADIOLOGY REPORT*  Clinical Data: Sickle cell crisis; assess viability of pregnancy and follow up interval growth.  TRANSVAGINAL OB ULTRASOUND  Technique:  Transvaginal ultrasound was performed for evaluation of the gestation as well as the maternal uterus and adnexal regions.  Comparison: Pelvic ultrasound performed 01/15/2011  Findings:  Intrauterine gestational sac: Visualized/normal in shape. Yolk sac: Yes Embryo: No Cardiac Activity: N/A  MSD: 11.1 mm  5w  6d                [place] EDC: 09/21/2011  Maternal uterus/Adnexae: The uterus is otherwise unremarkable in appearance.  The ovaries are within normal limits.  The right ovary measures 3.6 x 2.9 x 2.9 cm, while the left ovary measures 2.9 x 1.6 x 1.7 cm. No suspicious adnexal masses are seen; a corpus luteal cyst is identified on the right side.  Limited color Doppler evaluation demonstrates grossly normal color Doppler blood flow within the right ovary.  No free fluid is seen within the pelvic cul-de-sac.  IMPRESSION: Single intrauterine gestational sac noted, with a  mean sac diameter of 1.1 cm, corresponding to a gestational age of [redacted] weeks 6 days. At this age, the embryo  may not yet be visible; a yolk sac is now seen.  The gestational sac has grown slightly less than expected since the prior ultrasound, but still remains within normal limits.  The prior ultrasound reflects an estimated gestational age of [redacted] weeks 2 days, reflecting a date of delivery of Sep 18, 2011.  Original Report Authenticated By: Tonia Ghent, M.D.    MAU Course  Procedures  Assessment and Plan  Assessment: 1. Sickle Cell pain crisis improved w/ Dilaudid and Ativan 2. Stable Leukocytosis or unknown etiology 3. Stable anemia  Plan: 1. D/C home per consult w/ Dr. Macon Large 2. Rx Percocet 5/325 #40 3. F/U AS 01/27/11 at [facility] and 03/09/11 Dr. Mikel Cella, heme/onc 4. Avoid dehydration 5. Urine Culture 6. Initiate PNC   Phat Dalton 01/25/2011, 1:35 AM

## 2011-01-25 NOTE — ED Notes (Signed)
Report received from [facility] ([place]). Pt currently in U/S

## 2011-01-25 NOTE — Progress Notes (Signed)
V. Smith, CNM at bedside.  Assessment done and poc discussed with pt.  

## 2011-01-25 NOTE — Progress Notes (Signed)
Pt reports "i'm in crisis" reports history on sicklecell disease,

## 2011-01-25 NOTE — Progress Notes (Signed)
Pt presents to [facility] for sickle crisis that she states "came out of nowhere"

## 2011-01-26 ENCOUNTER — Encounter: Payer: Self-pay | Admitting: Family Medicine

## 2011-01-26 NOTE — ED Provider Notes (Signed)
Attestation of Attending Supervision of Advanced Practitioner: Evaluation and management procedures were performed by the PA/NP/CNM/OB Fellow under my supervision/collaboration. Chart reviewed and agree with management and plan.  Tiffanye Hartmann A 01/26/2011 8:45 AM

## 2011-01-26 NOTE — H&P (Signed)
[name] is an 20 y.o. female.   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  She was recently admitted to the hospital for a sickle cell crisis and discharged home on 01/17/11 and she has been with only mild pain since discharge.  Starting on 9/10, she began experiencing severe back pain, typical of her sickle cell crisis pain.  She presented to [facility], where she had an ultrasound (results below) and was observed and sent home with refills for her pain medications.  However she has not been able to sleep due to the pain and therefore she presented to [facility] on the evening of 01/25/11.  She was kept in the CDU and given IV Dilaudid Q2 hours but still complaining of intractable pain and therefore FPTS was called for admission.  She currently denies any urinary discharge or dysuria, fever, chills, cough, chest pain, or shortness of breath.  She endorses back pain, some very minimal abdominal pain, and some nausea without vomiting.  Past Medical History  Diagnosis Date  . Sickle cell anemia with crisis   . Sickle cell anemia     Past Surgical History  Procedure Date  . Cholecystectomy     Family Hx:  She has a sister with Sickle cell disease  Social History:  reports that she has quit smoking. Her smoking use included Cigars. She does not have any smokeless tobacco history on file. She reports that she does not drink alcohol or use illicit drugs.  She lives at home with her boyfriend.    Allergies:  Allergies  Allergen Reactions  . Toradol     Some itching, give with benedryl    Medications Prior to Admission  Medication Dose Route Frequency Provider Last Rate Last Dose  . LORazepam (ATIVAN) injection 1 mg  1 mg Intravenous Once [place], CNM   1 mg at 01/25/11 0148  . oxyCODONE-acetaminophen (PERCOCET) 5-325 MG per tablet 2 tablet   2 tablet Oral Once [place], CNM   2 tablet at 01/25/11 0433  . DISCONTD: HYDROmorphone (DILAUDID) injection 2 mg  2 mg Intravenous Once [place], CNM   2 mg at 01/25/11 0152  . DISCONTD: HYDROmorphone (DILAUDID) injection 2 mg  2 mg Intravenous Once Tereso Newcomer, MD      . DISCONTD: lactated ringers infusion   Intravenous Continuous Dorathy Kinsman, CNM 125 mL/hr at 01/25/11 0140     No current outpatient prescriptions on file as of 01/26/2011.    Results for orders placed during the hospital encounter of 01/25/11 (from the past 48 hour(s))  DIFFERENTIAL     Status: Abnormal   Collection Time   01/25/11  9:21 PM      Component Value Range Comment   Neutrophils Relative 68  43 - 77 (%)    Lymphocytes Relative 18  12 - 46 (%)    Monocytes Relative 11  3 - 12 (%)    Eosinophils Relative 2  0 - 5 (%)    Basophils Relative 1  0 - 1 (%)    Neutro Abs 10.3 (*) 1.7 - 7.7 (K/uL)    Lymphs Abs 2.8  0.7 - 4.0 (K/uL)    Monocytes Absolute 1.7 (*) 0.1 - 1.0 (K/uL)    Eosinophils Absolute 0.3  0.0 - 0.7 (K/uL)  Basophils Absolute 0.2 (*) 0.0 - 0.1 (K/uL)    RBC Morphology RARE NRBCs      Smear Review LARGE PLATELETS PRESENT     CBC     Status: Abnormal   Collection Time   01/25/11  9:21 PM      Component Value Range Comment   WBC 15.3 (*) 4.0 - 10.5 (K/uL)    RBC 2.59 (*) 3.87 - 5.11 (MIL/uL)    Hemoglobin 7.7 (*) 12.0 - 15.0 (g/dL)    HCT 91.4 (*) 78.2 - 46.0 (%)    MCV 86.5  78.0 - 100.0 (fL)    MCH 29.7  26.0 - 34.0 (pg)    MCHC 34.4  30.0 - 36.0 (g/dL)    RDW 95.6 (*) 21.3 - 15.5 (%)    Platelets 580 (*) 150 - 400 (K/uL)   RETICULOCYTES     Status: Abnormal   Collection Time   01/25/11  9:21 PM      Component Value Range Comment   Retic Ct Pct 22.3 (*) 0.4 - 3.1 (%)    RBC. 2.59 (*) 3.87 - 5.11 (MIL/uL)    Retic Count, Manual 577.6 (*) 19.0 - 186.0 (K/uL)   BASIC METABOLIC PANEL     Status: Abnormal   Collection Time   01/25/11  9:21 PM      Component Value Range  Comment   Sodium 137  135 - 145 (mEq/L)    Potassium 3.5  3.5 - 5.1 (mEq/L)    Chloride 102  96 - 112 (mEq/L)    CO2 25  19 - 32 (mEq/L)    Glucose, Bld 99  70 - 99 (mg/dL)    BUN 4 (*) 6 - 23 (mg/dL)    Creatinine, Ser <0.86 (*) 0.50 - 1.10 (mg/dL) ICTERUS AT THIS LEVEL MAY AFFECT RESULT   Calcium 9.3  8.4 - 10.5 (mg/dL)    GFR calc non Af Amer NOT CALCULATED  >60 (mL/min)    GFR calc Af Amer NOT CALCULATED  >60 (mL/min)    US Ob Transvaginal  01/25/2011  *RADIOLOGY REPORT*  Clinical Data: Sickle cell crisis; assess viability of pregnancy and follow up interval growth.  TRANSVAGINAL OB ULTRASOUND  Technique:  Transvaginal ultrasound was performed for evaluation of the gestation as well as the maternal uterus and adnexal regions.  Comparison: Pelvic ultrasound performed 01/15/2011  Findings:  Intrauterine gestational sac: Visualized/normal in shape. Yolk sac: Yes Embryo: No Cardiac Activity: N/A  MSD: 11.1 mm  5w  6d                [place] EDC: 09/21/2011  Maternal uterus/Adnexae: The uterus is otherwise unremarkable in appearance.  The ovaries are within normal limits.  The right ovary measures 3.6 x 2.9 x 2.9 cm, while the left ovary measures 2.9 x 1.6 x 1.7 cm. No suspicious adnexal masses are seen; a corpus luteal cyst is identified on the right side.  Limited color Doppler evaluation demonstrates grossly normal color Doppler blood flow within the right ovary.  No free fluid is seen within the pelvic cul-de-sac.  IMPRESSION: Single intrauterine gestational sac noted, with a mean sac diameter of 1.1 cm, corresponding to a gestational age of [redacted] weeks 6 days. At this age, the embryo may not yet be visible; a yolk sac is now seen.  The gestational sac has grown slightly less than expected since the prior ultrasound, but still remains within normal limits.  The prior ultrasound reflects an estimated gestational age of  [redacted] weeks 2 days, reflecting a date of delivery of Sep 18, 2011.  Original Report Authenticated  By: Tonia Ghent, M.D.    ROS  12 point ROS completed and negative except per HPI  Last menstrual period 12/14/2010. Physical Exam  Constitutional: She is oriented to person, place, and time. She appears well-developed and well-nourished.  HENT:  Head: Normocephalic and atraumatic.  Right Ear: External ear normal.  Left Ear: External ear normal.  Eyes: Conjunctivae and EOM are normal. Pupils are equal, round, and reactive to light.  Neck: Normal range of motion. Neck supple.  Cardiovascular: Normal rate, regular rhythm and intact distal pulses.   Murmur heard. Respiratory: Effort normal. No respiratory distress.  GI: Soft. She exhibits no distension. There is tenderness.       Minimal tenderness lower quadrants without guarding or rebound  Musculoskeletal: Normal range of motion. She exhibits no edema and no tenderness.  Neurological: She is alert and oriented to person, place, and time. She has normal reflexes. No cranial nerve deficit.  Skin: Skin is warm and dry. No rash noted. No erythema.  Psychiatric: She has a normal mood and affect. Her behavior is normal.  VS: T98   HR 90   BP 136/78   RR 18  Assessment/Plan Nancy Michels is 20 yo F, now pregnant, presenting with back pain typical of her sickle cell pain crisis: 1.  Sickle cell pain:  No other concerning signs or symptoms by history or exam.  No vaginal bleeding.  Plan to start patient on Dilaudid PCA without a basal rate and admit to floor bed.  Patient believes that she could likely go home tomorrow if she receives pain control for tonight as well as a sleep aid.  She feels that a good night of sleep will go a long way towards controlling her pain.  If her pain does not abate, we might need to add basal rate to Dilaudid, but for now will keep prn dosing with an eye towards transitioning to PO meds in AM.  No signs of acute chest, no splenomegaly, CBC currently stable. 2.  Sickle Cell SS Disease:  Patient will need to be followed  at [facility].  Will continue 4 mg Folic acid.  She has stopped her Hydroxyurea on her own.  Follow CBC. 3. Sleep issues:  Will provide patient with 1 dose ambien tonight to help her sleep. 4.  Pregnancy:  High risk, as above.  Continue prenatal vitamin.  She already has an appointment scheduled with high risk clinic.  She is also scheduled for PCP follow-up tomorrow.  She is scheduled to have beta-HCG blood draw this week, will do this in house as mean sac diameter has decreased from prior ultrasound.  5.  FEN/GI:  Continue NS at 125 cc/hr x 24 hours.  Regular adult diet as tolerated 6. PPX: lovenox as she is pregnant 7. Dispo:  Hopeful for quick turnaround and short hospital course.  Blain Hunsucker,JEFF 01/26/2011, 2:11 PM [phone]

## 2011-01-27 LAB — CBC
HCT: 17.3 % — ABNORMAL LOW (ref 36.0–46.0)
MCV: 86.1 fL (ref 78.0–100.0)
RBC: 2.01 MIL/uL — ABNORMAL LOW (ref 3.87–5.11)
WBC: 13.6 10*3/uL — ABNORMAL HIGH (ref 4.0–10.5)

## 2011-01-27 LAB — BASIC METABOLIC PANEL
BUN: 5 mg/dL — ABNORMAL LOW (ref 6–23)
CO2: 24 mEq/L (ref 19–32)
Chloride: 107 mEq/L (ref 96–112)
Creatinine, Ser: <0.47 mg/dL — ABNORMAL LOW (ref 0.50–1.10)
Glucose, Bld: 87 mg/dL (ref 70–99)

## 2011-01-28 LAB — CBC
HCT: 26.2 % — ABNORMAL LOW (ref 36.0–46.0)
MCH: 30.1 pg (ref 26.0–34.0)
MCHC: 35.1 g/dL (ref 30.0–36.0)
RDW: 17 % — ABNORMAL HIGH (ref 11.5–15.5)

## 2011-01-28 LAB — CROSSMATCH
DAT, IgG: NEGATIVE
Unit division: 0
Unit division: 0

## 2011-01-28 LAB — BASIC METABOLIC PANEL
BUN: 4 mg/dL — ABNORMAL LOW (ref 6–23)
Calcium: 9 mg/dL (ref 8.4–10.5)
Potassium: 3.5 mEq/L (ref 3.5–5.1)
Sodium: 135 mEq/L (ref 135–145)

## 2011-01-28 NOTE — Discharge Summary (Signed)
Physician Discharge Summary  Patient ID: [name] MRN: [id number] DOB/AGE: Feb 06, 1991 20 y.o.  Admit date: 01/26/2011 Discharge date: 01/28/2011  Admission Diagnoses: Sickle Cell Pain Crisis  Discharge Diagnoses:  Pain Crisis, High Risk Pregnancy, Nausea  Discharged Condition: stable  Hospital Course: Pt is a 20 yo F with PMH of HbSS disease who presented with an acute Pain crisis 1. Pain Crisis- Pt was observed in the CDU for some time prior to admission. Her pain was not well controlled with Dilaudid so she was admitted for further pain control. She was started on a Dilaudid PCA which controlled her pain. Pt's pain was in her upper back, neck and head. She had improvement, and was discharged home with Oxycodone 15mg  prn. 2. Anemia- Pt had a Hb drop from 7.2 to 6.0 on Hospital day 2. She was transfused 2U PRBC with a rise in her Hb to 9.2 and clinical improvement. Pt's Hb was stable at discharge.  3. Pregnancy- Pt had Beta-HCG levels which were rising appropriately. She had a repeat ultrasound which showed less growth than expected. Pt is taking prenatal vitamins as well as high dose folic acid. She needs to establish care at [facility]. She will see Dr. Edmonia James on 10/1 for a new OB visit, and then she will go to [facility] for the rest of her prenatal care. Ms. Brazee is aware of this plan. 4. Nausea- Continuous through her admission, likely secondary to pregnancy. Pt was given Zofran 4mg  since it has a better safety profile than Phenergan for nausea.  5. Dispo- Pt discharged home after pain improved, with close PCP follow-up   Significant Diagnostic Studies:  CBC    Component Value Date/Time   WBC 11.6* 01/29/2011 0600   RBC 3.05* 01/29/2011 0600   HGB 9.2* 01/29/2011 0600   HCT 26.2* 01/29/2011 0600   PLT 535* 01/29/2011 0600   MCV 85.9 01/29/2011 0600   MCH 30.2 01/29/2011 0600   MCHC 35.1 01/29/2011 0600   RDW 16.9* 01/29/2011 0600   LYMPHSABS 2.8 01/25/2011 2121   MONOABS 1.7* 01/25/2011 2121   EOSABS 0.3 01/25/2011 2121   BASOSABS 0.2* 01/25/2011 2121    BMET    Component Value Date/Time   NA 137 01/29/2011 0600   K 3.6 01/29/2011 0600   CL 102 01/29/2011 0600   CO2 26 01/29/2011 0600   GLUCOSE 89 01/29/2011 0600   BUN 5* 01/29/2011 0600   CREATININE <0.47* 01/29/2011 0600   CALCIUM 9.3 01/29/2011 0600   GFRNONAA NOT CALCULATED 01/29/2011 0600   GFRAA NOT CALCULATED 01/29/2011 0600   Transvaginal Ultrasound on 01/25/11: IMPRESSION:  Single intrauterine gestational sac noted, with a mean sac diameter  of 1.1 cm, corresponding to a gestational age of [redacted] weeks 6 days.  At this age, the embryo may not yet be visible; a yolk sac is now  seen.  The gestational sac has grown slightly less than expected since the  prior ultrasound, but still remains within normal limits. The  prior ultrasound reflects an estimated gestational age of [redacted] weeks 2  days, reflecting a date of delivery of Sep 18, 2011.   Treatments: analgesia: Dilaudid  Discharge Exam: Last menstrual period 12/14/2010.   Disposition: Home in stable medical conditions  Discharge Orders    Future Appointments: Provider: Department: Dept Phone: Center:   02/01/2011 10:30 AM [name] [facility] [phone] [facility]   02/14/2011 2:30 PM [name] [facility] [phone] [facility]   02/17/2011 8:30  AM [facility] [phone] [facility]     Medications: Zofran 4mg  q6 prn Oxycodone 15 mg q 6 prn Folic Acid 4mg  daily Prenatal Vitamin  Dictation: [id number]  Signed: Neftaly Inzunza 01/28/2011, 3:26 PM

## 2011-01-29 LAB — BASIC METABOLIC PANEL
BUN: 5 mg/dL — ABNORMAL LOW (ref 6–23)
Calcium: 9.3 mg/dL (ref 8.4–10.5)
Creatinine, Ser: <0.47 mg/dL — ABNORMAL LOW (ref 0.50–1.10)
Glucose, Bld: 89 mg/dL (ref 70–99)
Potassium: 3.6 mEq/L (ref 3.5–5.1)

## 2011-01-29 LAB — RETICULOCYTES
RBC.: 3.05 MIL/uL — ABNORMAL LOW (ref 3.87–5.11)
Retic Ct Pct: 17.6 % — ABNORMAL HIGH (ref 0.4–3.1)

## 2011-01-29 LAB — CBC
Hemoglobin: 9.2 g/dL — ABNORMAL LOW (ref 12.0–15.0)
MCHC: 35.1 g/dL (ref 30.0–36.0)
Platelets: 535 10*3/uL — ABNORMAL HIGH (ref 150–400)
RDW: 16.9 % — ABNORMAL HIGH (ref 11.5–15.5)

## 2011-02-01 ENCOUNTER — Encounter: Payer: Self-pay | Admitting: Family Medicine

## 2011-02-01 ENCOUNTER — Ambulatory Visit (INDEPENDENT_AMBULATORY_CARE_PROVIDER_SITE_OTHER): Payer: Medicaid Other | Admitting: Family Medicine

## 2011-02-01 DIAGNOSIS — Z331 Pregnant state, incidental: Secondary | ICD-10-CM

## 2011-02-01 DIAGNOSIS — F17200 Nicotine dependence, unspecified, uncomplicated: Secondary | ICD-10-CM

## 2011-02-01 DIAGNOSIS — D571 Sickle-cell disease without crisis: Secondary | ICD-10-CM

## 2011-02-01 DIAGNOSIS — M549 Dorsalgia, unspecified: Secondary | ICD-10-CM

## 2011-02-01 NOTE — Assessment & Plan Note (Signed)
Stopped smoking in June 2012. Encouraged boyfriend to stop smoking.

## 2011-02-01 NOTE — Progress Notes (Signed)
  Hospital followup for sickle cell pain crisis: Patient states she feels much better, pain crisis was in back, no longer needing when necessary pain medications. Has had some constipation since leaving hospital secondary to side effect of narcotic. Using MiraLAX as needed.  Muscles ache and upper back: Patient states that spine and lower back pain improved from sickle cell crisis as per above. But now having some pain in muscles and right scapula area, states that this started after being in bed during crisis. Lying down and massage makes it better and movement, stretching and bending makes it worse. No problems with bowel or bladder incontinence. No fever. No chills.  Pregnancy: Patient scheduled for new OB appointment with me on 10/ 1. Patient scheduled for evaluation high [facility] clinic on 10/ 4. Patient taking prenatal vitamin. Patient also seeing him on a doctor on October 1.   Review of Systems As per above.    Objective:   Physical Exam  Constitutional: She is oriented to person, place, and time. She appears well-developed and well-nourished.  Cardiovascular: Normal rate, regular rhythm and normal heart sounds.   No murmur heard. Pulmonary/Chest: Effort normal and breath sounds normal. No respiratory distress. She has no wheezes. She has no rales.  Abdominal: Soft. She exhibits no distension. There is no tenderness.  Musculoskeletal:       Back exam: Tenderness to palpation in right scapular area. Minimal tenderness to palpation in lower back paraspinal area. Sensation intact in lower strength bilateral. Strength 5/5 in lower strength bilateral. Reflexes normal and equal in lower shin is bilateral.  Neurological: She is alert and oriented to person, place, and time.  Skin: No rash noted.  Psychiatric: She has a normal mood and affect. Her behavior is normal.          Assessment & Plan:

## 2011-02-01 NOTE — Assessment & Plan Note (Signed)
Sickle cell crisis now resolved. Followup appointment as per history of present illness, with hematology.

## 2011-02-01 NOTE — Patient Instructions (Addendum)
Tylenol as needed for pain miralax as needed for constipation  Return as scheduled for prenatal visit on 02/14/11 [facility]  Look under education for parenting--parenting classes.- do before next appt. Let me know cost.  Back Exercises   These exercises may help you when beginning to rehabilitate your injury. Your symptoms may resolve with or without further involvement from your physician, physical therapist or athletic trainer. While completing these exercises, remember:   Restoring tissue flexibility helps normal motion to return to the joints. This allows healthier, less painful movement and activity.  An effective stretch should be held for at least 30 seconds.  A stretch should never be painful. You should only feel a gentle lengthening or release in the stretched tissue.   STRETCH - Extension, Prone on Elbows   Lie on your stomach on the floor, a bed will be too soft. Place your palms about shoulder width apart and at the height of your head.  Place your elbows under your shoulders. If this is too painful, stack pillows under your chest.  Allow your body to relax so that your hips drop lower and make contact more completely with the floor.  Hold this position for __________ seconds.  Slowly return to lying flat on the floor. Repeat __________ times. Complete this exercise __________ times per day.      RANGE OF MOTION - Extension, Prone Press Ups   Lie on your stomach on the floor, a bed will be too soft. Place your palms about shoulder width apart and at the height of your head.  Keeping your back as relaxed as possible, slowly straighten your elbows while keeping your hips on the floor. You may adjust the placement of your hands to maximize your comfort. As you gain motion, your hands will come more underneath your shoulders.  Hold this position __________ seconds.  Slowly return to lying flat on the floor. Repeat __________ times. Complete this  exercise __________ times per day.        RANGE OF MOTION- Quadruped, Neutral Spine   Assume a hands and knees position on a firm surface. Keep your hands under your shoulders and your knees under your hips. You may place padding under your knees for comfort.    Drop your head and point your tail bone toward the ground below you.  This will round out your low back like an angry cat. Hold this position for __________ seconds.   Slowly lift your head and release your tail bone so that your back sags into a large arch, like an old horse.  Hold this position for __________ seconds.   Repeat this until you feel limber in your low back.  Now, find your "sweet spot." This will be the most comfortable position somewhere between the two previous positions. This is your neutral spine. Once you have found this position, tense your stomach muscles to support your low back.  Hold this position for __________ seconds. Repeat __________ times.  Complete this exercise __________ times per day.      STRETCH - Flexion, Single Knee to Chest   Lie on a firm bed or floor with both legs extended in front of you.  Keeping one leg in contact with the floor, bring your opposite knee to your chest. Hold your leg in place by either grabbing behind your thigh or at your knee.  Pull until you feel a gentle stretch in your low back. Hold __________ seconds.  Slowly release your  grasp and repeat the exercise with the opposite side. Repeat __________ times. Complete this exercise __________ times per day.     STRETCH - Hamstrings, Standing  Stand or sit and extend your __________ leg, placing your foot on a chair or foot stool  Keeping a slight arch in your low back and your hips straight forward.    Lead with your chest and lean forward at the waist until you feel a gentle stretch in the back of your __________ knee or thigh. (When done correctly, this exercise requires leaning only a small distance.)   Hold this position for __________ seconds. Repeat __________ times. Complete this stretch __________ times per day.     STRENGTHENING - Deep Abdominals, Pelvic Tilt   Lie on a firm bed or floor. Keeping your legs in front of you, bend your knees so they are both pointed toward the ceiling and your feet are flat on the floor.  Tense your lower abdominal muscles to press your low back into the floor. This motion will rotate your pelvis so that your tail bone is scooping upwards rather than pointing at your feet or into the floor.  With a gentle tension and even breathing, hold this position for __________ seconds. Repeat __________ times. Complete this exercise __________ times per day.     STRENGTHENING - Abdominals, Crunches   Lie on a firm bed or floor. Keeping your legs in front of you, bend your knees so they are both pointed toward the ceiling and your feet are flat on the floor. Cross your arms over your chest.    Slightly tip your chin down without bending your neck.  Tense your abdominals and slowly lift your trunk high enough to just clear your shoulder blades. Lifting higher can put excessive stress on the low back and does not further strengthen your abdominal muscles.  Control your return to the starting position. Repeat __________ times. Complete this exercise __________ times per day.      STRENGTHENING - Quadruped, Opposite UE/LE Lift   Assume a hands and knees position on a firm surface. Keep your hands under your shoulders and your knees under your hips.  You may place padding under your knees for comfort.    Find your neutral spine and gently tense your abdominal muscles so that you can maintain this position. Your shoulders and hips should form a rectangle that is parallel with the floor and is not twisted.   Keeping your trunk steady, lift your right hand no higher than your shoulder and then your left leg no higher than your hip. Make sure you are not holding your  breath. Hold this position __________ seconds.  Continuing to keep your abdominal muscles tense and your back steady, slowly return to your starting position. Repeat with the opposite arm and leg. Repeat __________ times. Complete this exercise __________ times per day.   Document Released: 05/20/2005  Document Re-Released: 04/20/2009 [facility] Patient Information 2011 [place], [place].

## 2011-02-01 NOTE — Assessment & Plan Note (Signed)
Musculoskeletal  in nature. No red flags on exam. Tylenol as needed for pain. Home exercises for back. Patient to return on October 1 for new OB visit. We'll recheck at that time. Patient return sooner if needed.

## 2011-02-01 NOTE — Assessment & Plan Note (Signed)
Patient to return on October 1 for new OB appointment. High risk clinic visit on October 4

## 2011-02-07 LAB — URINALYSIS, ROUTINE W REFLEX MICROSCOPIC
Nitrite: NEGATIVE
Specific Gravity, Urine: 1.011
Urobilinogen, UA: >8 — ABNORMAL HIGH
pH: 7

## 2011-02-07 LAB — PREGNANCY, URINE: Preg Test, Ur: NEGATIVE

## 2011-02-07 LAB — DIFFERENTIAL
Basophils Absolute: 0
Basophils Relative: 0
Lymphocytes Relative: 10 — ABNORMAL LOW
Neutro Abs: 13.7 — ABNORMAL HIGH
Neutrophils Relative %: 79 — ABNORMAL HIGH

## 2011-02-07 LAB — URINE CULTURE

## 2011-02-07 LAB — CBC
MCHC: 33.7
Platelets: 535 — ABNORMAL HIGH
RDW: 18.4 — ABNORMAL HIGH

## 2011-02-07 LAB — URINE MICROSCOPIC-ADD ON

## 2011-02-07 LAB — RETICULOCYTES
Retic Count, Absolute: 343.2 — ABNORMAL HIGH
Retic Ct Pct: 12.3 — ABNORMAL HIGH

## 2011-02-08 NOTE — H&P (Signed)
[name], Nancy NO.:  [id number]  MEDICAL RECORD NO.:  [id number]  LOCATION:  3309                         FACILITY:  [facility]  PHYSICIAN:  [name], M.D.DATE OF BIRTH:  06-Jan-1991  DATE OF ADMISSION:  01/15/2011 DATE OF DISCHARGE:                             HISTORY & PHYSICAL   CHIEF COMPLAINT:  Abdominal pain crisis.  HISTORY OF PRESENT ILLNESS:  [name] is a 20 year old female with past medical history of sickle cell anemia and multiple hospitalizations for pain crises, who presented to the emergency department with abdominal pain.  She says that the pain started yesterday afternoon.  She had a lot of nausea and vomiting throughout the day.  By the time she was finishing up her shift at [employer] last night, her stomach pain was 10/10 and she went to the emergency room.  She denies pain anywhere but in her abdomen.  She denies any fevers, chills, or shortness of breath. In the emergency room, urine pregnancy test was positive.  The patient states that this was a planned pregnancy.  She and her boyfriend have been trying to have a baby.  She says that she has not been taking any prenatal vitamins, however, she did quit smoking and she does not use any illegal drugs or drink alcohol.  When the patient asked about her chronic low back pain, she says that hurts all the time even when she is not having a pain crisis.  She has had no injury, they just feels sore often.  PAST MEDICAL HISTORY:  Significant for sickle cell anemia.  PAST SURGICAL HISTORY:  Significant for cholecystectomy.  FAMILY HISTORY:  Other family members carry sickle cell trait, no one has SS disease.  SOCIAL HISTORY:  The patient works at [employer].  She recently graduated from high school.  She has quit smoking, does not use alcohol or illicit drugs.  CURRENT HOME MEDICATIONS:  None.  The patient recently self-discontinued hydroxyurea.  ALLERGIES:  Toradol causes  itching.  REVIEW OF SYSTEMS:  Negative for fever, negative for blurred vision, negative for shortness of breath, negative for chest pain.  Positive for nausea and vomiting and abdominal pain as well as constipation. Negative for blood in stool, negative for dysuria or frequency. Positive for back pain.  Negative for rash.  Negative for dizziness or headaches.  Negative for easy bruising or bleeding.  Negative for depression.  PHYSICAL EXAM:  VITAL SIGNS:  Temperature 98.9, pulse 83, respiratory rate 20, blood pressure 92/54, pulse ox 93% on room air. GENERAL:  The patient is alert and oriented to person, place, and time. She is well-developed and well-nourished.  She appears mildly distressed. HEAD, EYES, EARS, NOSE and THROAT:  Normocephalic.  Extraocular movements intact.  No scleral icterus. NECK:  Supple.  No JVD.  No thyromegaly. CARDIOVASCULAR:  Regular rate and rhythm.  No murmurs heard. RESPIRATORY:  Normal respiratory effort. LUNGS:  Clear to auscultation bilaterally. ABDOMEN:  Positive bowel sounds.  Diffuse tenderness to palpation.  No masses palpable. MUSCULOSKELETAL:  No edema and no tenderness in extremities. Lymphadenopathy none. NEUROLOGIC:  No focal deficits. SKIN:  No rashes. PSYCHIATRIC:  Normal mood and affect.  LABS AND STUDIES:  CBC;  white blood cell count 11.7, hemoglobin 7.1, hematocrit 20.2, platelets 47.  Reticulocyte count is 543.  Basic metabolic panel; sodium 138, potassium 3.2, chloride 106, CO2 25, glucose 92, BUN 3, creatinine less than 0.47, calcium 8.3.  Urine pregnancy test was positive.  Beta-HCG quant was 466.  GC Chlamydia negative.  Wet prep negative.  RADIOLOGY:  Transvaginal OB ultrasound showed small intrauterine gestational sac, consistent with estimated gestational age of [redacted] weeks and 6 days, estimated delivery date of Sep 18, 2011, small amount of fluid in endometrium, suggesting hemorrhage.  Fetal pole and yolk sac are not  visualized.  Gestational sac is too small.  Overall impression is tiny intrauterine cyst with residual reaction and small hemorrhage, suggesting early pregnancy, estimated gestational age by sac size 4 weeks and 6 days fetal pole and yolk sac not visualized.  Follow up with serial quantitative beta-HCG levels given early pregnancy.  ASSESSMENT AND PLAN:  [name] is a 20 year old female with past medical history of sickle cell anemia who presents with sickle cell pain crisis, found to be 4 weeks and 6 days' pregnant.  1. Hematology.  Hemoglobin is slightly low at 7.1, but no indication     to transfuse at this time.  We will continue monitoring her blood     counts. 2. Pain.  We will try to minimize narcotics as much as possible in the     event of her pain crisis.  We will give IV Dilaudid p.r.n. instead     of using a PCA, no NSAIDs due to pregnancy. 3. Pregnancy.  We will give pregnancy safe medications as much as     possible and try to minimize narcotics, although they are necessary     during the pain crisis.  We will start prenatal vitamin.  Due to     early findings on ultrasound, we will consult with a High Risk     Clinic and consider following beta-HCG quants to ensure this is a     viable pregnancy.  We will plan on having the patient follow up     with the [facility] for outpatient management     of her pregnancy. 4. Back pain.  No red flags, chronic problem.  The patient may benefit     from physical therapy as an outpatient. 5. Fluids, electrolytes, and nutrition/gastrointestinal:  We will give     the patient normal saline for hydration in the setting of     vasoocclusive pain crisis.  We will also allow p.o. diet as     tolerated.  We will give Protonix for GI prophylaxis and Zofran as     needed for nausea and vomiting. 6. Deep vein thrombosis prophylaxis.  We will give Lovenox 30 subcu     b.i.d. 7. Disposition is pending clinical improvement  in pain crisis     resolution.   ______________________________ Ardyth Gal, MD   ______________________________ [name], M.D.   CR/MEDQ  D:  01/15/2011  T:  01/15/2011  Job:  [id number]  Electronically Signed by Ardyth Gal MD on 01/31/2011 09:10:12 AM Electronically Signed by [name] M.D. on 02/08/2011 11:23:04 AM

## 2011-02-10 NOTE — Discharge Summary (Signed)
NAME:  [name], [name] NO.:  [id number]  MEDICAL RECORD NO.:  [id number]  LOCATION:  5508                         FACILITY:  [facility]  PHYSICIAN:  Paula Compton, MD        DATE OF BIRTH:  [birth year]  DATE OF ADMISSION:  01/26/2011 DATE OF DISCHARGE:  01/28/2011                              DISCHARGE SUMMARY   PRIMARY CARE PHYSICIAN:  [name], MD, at [facility].  ADMISSION DIAGNOSIS:  Sickle cell pain crisis.  DISCHARGE DIAGNOSES:  Pain crisis, high-risk pregnancy, nausea.  DISCHARGE CONDITION:  Stable.  HOSPITAL COURSE:  The patient is a 20 year old female with past medical history of hemoglobin S-S disease who presented with acute pain crisis. 1. Pain crisis.  The patient was observed in the CDU for some time     prior to admission.  Her pain was not well controlled with     Dilaudid, so she was admitted for further pain control.  She     started on Dilaudid PCA on demand which controlled her pain well.     The patient's pain was reported in her upper back and neck and     head.  She had improvement and was discharged home with oxycodone     15 mg p.r.n. 2. Anemia.  The patient had a hemoglobin dropped from 7.2 to 6.0 on     hospital day #2.  She was transfused 2 units packed red blood cells     with a rise in her hemoglobin to 9.2 also with significant clinical     improvement.  The patient's hemoglobin was stable at discharge. 3. Pregnancy.  The patient had a beta-HCG level, which was rising     appropriately.  She also had a repeat transvaginal ultrasound which     showed less growth than expected, but still within normal limits     for estimated age.  The patient is taking prenatal vitamins as well     as high-dose folic acid at home.  She needs to establish care at     [facility].  She will see Dr. Edmonia Dynasia Kercheval on October 1 for a     new OB visit and then she will go to High-Risk Clinic at [facility] to complete the  rest of her prenatal care.  [name] is     aware of this plan. 4. Nausea.  The patient had continuous nausea throughout her     admission, likely secondary to pregnancy.  She was given Zofran 4     mg as needed since it has a better safety profile for nausea. 5. Disposition.  The patient discharged home after pain improved with     close primary care physician followup.  SIGNIFICANT DIAGNOSTIC STUDIES:  At discharge, her hemoglobin was 9.2 which is stable from previous CBC.  Her BMET showed sodium 137, potassium 3.6, chloride 102, bicarb 26, BUN 5, creatinine less than 0.47, and glucose 89.  RADIOLOGY STUDIES:  Transvaginal ultrasound on January 25, 2011, showed single intrauterine gestational sac with a mean sac diameter of 1.1 cm corresponding to gestational age of [redacted] weeks 6 days.  Of note, the sac has grown slightly less than expected since prior ultrasound, but still remains within normal limits.  The prior ultrasound reflects no stated age of 6 weeks 2 days, reflecting delivery date Sep 18, 2011.  TREATMENTS:  Dilaudid PCA  MEDICATION:  New medications: 1. Zofran 4 mg q.6 p.r.n. 2. Oxycodone 15 mg q.6 p.r.n.  Medications to continue: 1. Folic acid 4 mg daily. 2. Prenatal vitamins.  DISPOSITION:  The patient is discharged home in stable medical condition.  FOLLOWUP APPOINTMENTS: 1. February 01, 2011, at [facility] with Dr. [name]. 2. February 14, 2011, at [facility] for a new OB     appointment with Dr. [name]. 3. February 17, 2011, at 8:30 a.m. at [facility]     [facility]inic.    ______________________________ Rodman Pickle, MD   ______________________________ Paula Compton, MD    AH/MEDQ  D:  02/01/2011  T:  02/01/2011  Job:  [id number]  Electronically Signed by Rodman Pickle  on 02/02/2011 08:53:21 PM Electronically Signed by Paula Compton MD on 02/10/2011 03:02:20 PM

## 2011-02-13 ENCOUNTER — Inpatient Hospital Stay (HOSPITAL_COMMUNITY)
Admission: AD | Admit: 2011-02-13 | Discharge: 2011-02-14 | Disposition: A | Discharge status: Hospice | Payer: Medicaid Other | Source: Ambulatory Visit | Attending: Obstetrics & Gynecology | Admitting: Obstetrics & Gynecology

## 2011-02-13 ENCOUNTER — Inpatient Hospital Stay (HOSPITAL_COMMUNITY): Payer: Medicaid Other

## 2011-02-13 ENCOUNTER — Encounter (HOSPITAL_COMMUNITY): Payer: Self-pay | Admitting: *Deleted

## 2011-02-13 DIAGNOSIS — D5700 Hb-SS disease with crisis, unspecified: Secondary | ICD-10-CM

## 2011-02-13 DIAGNOSIS — O209 Hemorrhage in early pregnancy, unspecified: Secondary | ICD-10-CM

## 2011-02-13 DIAGNOSIS — O99019 Anemia complicating pregnancy, unspecified trimester: Secondary | ICD-10-CM | POA: Insufficient documentation

## 2011-02-13 MED ORDER — OXYCODONE-ACETAMINOPHEN 5-325 MG PO TABS
1.0000 | ORAL_TABLET | Freq: Once | ORAL | Status: AC
  Administered 2011-02-13: 1 via ORAL
  Filled 2011-02-13: qty 1

## 2011-02-13 MED ORDER — ONDANSETRON 8 MG PO TBDP
8.0000 mg | ORAL_TABLET | Freq: Once | ORAL | Status: AC
  Administered 2011-02-13: 8 mg via ORAL
  Filled 2011-02-13: qty 1

## 2011-02-13 NOTE — Progress Notes (Signed)
Pt had sex yesterday -states she had an epsiode of bloody dicharge when she wiped this evening-she has no active bleeding

## 2011-02-13 NOTE — ED Provider Notes (Addendum)
History     Chief Complaint  Patient presents with  . Vaginal Bleeding   HPI  Pt is [redacted]weeks pregnant with complaint of spotting in pregnancy.  Pt states that she had sex yesterday and today had some spotting.  Pt denies cramping.  Pt's history is significant for sickle cell and pt was admitted with Sickle Cell pain crisis on 9/11.  She has been followed by [facility] with Dr. Edmonia James for her Sickle Cell and has an appointment on Oct 4.  Pt initially was feeling pain in her legs like she gets with her sickle cell anemia crisis.  She has not taken anything for pain.    Past Medical History  Diagnosis Date  . Sickle cell anemia with crisis   . Sickle cell anemia     Past Surgical History  Procedure Date  . Cholecystectomy   . Choley     No family history on file.  History  Substance Use Topics  . Smoking status: Former Smoker    Types: Cigars  . Smokeless tobacco: Never Used  . Alcohol Use: No    Allergies:  Allergies  Allergen Reactions  . Toradol Itching    Some itching, give with benedryl    Prescriptions prior to admission  Medication Sig Dispense Refill  . buPROPion (WELLBUTRIN XL) 150 MG 24 hr tablet Take 1 tablet (150 mg total) by mouth every morning.  30 tablet  2  . folic acid (FOLVITE) 1 MG tablet Take 1 mg by mouth daily.       . prenatal vitamin w/FE, FA (PRENATAL 1 + 1) 27-1 MG TABS Take 1 tablet by mouth daily.          ROS Physical Exam   Blood pressure 107/80, pulse 89, temperature 97.9 F (36.6 C), temperature source Oral, resp. rate 20, height 5\' 3"  (1.6 m), weight 171 lb (77.565 kg), last menstrual period 12/14/2010.  Physical Exam  Vitals reviewed. Constitutional: She is oriented to person, place, and time. She appears well-developed and well-nourished.  HENT:  Head: Normocephalic.  Eyes: Pupils are equal, round, and reactive to light.  Neck: Normal range of motion.  Cardiovascular: Normal rate.   GI: Soft.  Musculoskeletal: Normal  range of motion.  Neurological: She is alert and oriented to person, place, and time.  Skin: Skin is warm and dry.    MAU Course  Procedures OBSTETRIC <14 WK [place] AND TRANSVAGINAL OB US  Technique: Both transabdominal and transvaginal ultrasound  examinations were performed for complete evaluation of the  gestation as well as the maternal uterus, adnexal regions, and  pelvic cul-de-sac. Transvaginal technique was performed to assess  early pregnancy.  Comparison: Endovaginal ultrasound - 01/25/2011; 01/15/2011  Intrauterine gestational sac: Visualized/normal in shape.  Yolk sac: Yes  Embryo: Yes  Cardiac Activity: Yes  Heart Rate: 172 bpm  CRL: 1.9 cm 8 w 4 d [place] EDC: 10/01/11  Maternal uterus/adnexae:  Geographic area of hypo echogenicity adacent to the gestational sac  represents a small subchorionic hemorrhage.  The ovaries are within normal limits. The right ovary measures 3.6  x 2.7 x 3.5 cm. Left ovary measures 2.3 x 1.0 x 2.1 cm. No free  fluid within the pelvis.  IMPRESSION:  1. Single intrauterine pregnancy with measured crown length  corresponding to a gestational age of [redacted] weeks, 4 days, reflecting a  date of delivery of 09/21/2011. Cardiac activity detected with  heart rate of 172 beats per minute.  2. Small subchorionic  hemorrhage.  Original Report Authenticated By: Waynard Reeds, M.D.      Imaging    When pt seen initially, her pain was tolerable.  When she returned from ultrasound, pt's pain had increased. MDM [place] to confirm viability revealed [redacted]w[redacted]d viable IUP Pelvic exam deferred due to pain Percocet 1 tablet PO given without relief of pain- pain increasing and not controlled Dr. Penne Lash consulted and advised referral to [facility] for sickle cell crisis management Dr. Mikel Cella with [facility] contacted and will get a bed for pt- pt will be transferred via [facility] with Diluadid 1 mg IV ordered Assessment and Plan  Single living IUP [redacted]w[redacted]d  Bleeding in pregnancy-  small subchorionic hemorrhage Sickle Cell Pain crisis- transfer to [facility]- Dr. Paula Compton as accepting physician (discussed with Dr. Mikel Cella) Pamelia Hoit 02/13/2011, 10:43 PM   Agree with above note.  Tanyika Barros H. 02/16/2011 5:30 AM

## 2011-02-14 ENCOUNTER — Observation Stay (HOSPITAL_COMMUNITY)
Admission: AD | Admit: 2011-02-14 | Discharge: 2011-02-16 | Disposition: A | Discharge status: Home / Self Care | Payer: Medicaid Other | Source: Other Acute Inpatient Hospital | Attending: Family Medicine | Admitting: Family Medicine

## 2011-02-14 ENCOUNTER — Encounter: Payer: Self-pay | Admitting: Family Medicine

## 2011-02-14 ENCOUNTER — Encounter: Payer: Medicaid Other | Admitting: Family Medicine

## 2011-02-14 ENCOUNTER — Telehealth: Payer: Self-pay | Admitting: Family Medicine

## 2011-02-14 DIAGNOSIS — O0990 Supervision of high risk pregnancy, unspecified, unspecified trimester: Secondary | ICD-10-CM | POA: Insufficient documentation

## 2011-02-14 DIAGNOSIS — D5700 Hb-SS disease with crisis, unspecified: Secondary | ICD-10-CM | POA: Insufficient documentation

## 2011-02-14 DIAGNOSIS — O99019 Anemia complicating pregnancy, unspecified trimester: Principal | ICD-10-CM | POA: Insufficient documentation

## 2011-02-14 DIAGNOSIS — O209 Hemorrhage in early pregnancy, unspecified: Secondary | ICD-10-CM | POA: Insufficient documentation

## 2011-02-14 DIAGNOSIS — Z331 Pregnant state, incidental: Secondary | ICD-10-CM

## 2011-02-14 LAB — DIFFERENTIAL
Basophils Absolute: 0.1 10*3/uL (ref 0.0–0.1)
Basophils Relative: 0 % (ref 0–1)
Eosinophils Absolute: 0.6 10*3/uL (ref 0.0–0.7)
Eosinophils Relative: 3 % (ref 0–5)

## 2011-02-14 LAB — COMPREHENSIVE METABOLIC PANEL
AST: 19 U/L (ref 0–37)
CO2: 24 mEq/L (ref 19–32)
Calcium: 9.1 mg/dL (ref 8.4–10.5)
Creatinine, Ser: <0.47 mg/dL — ABNORMAL LOW (ref 0.50–1.10)

## 2011-02-14 LAB — CBC
MCH: 30 pg (ref 26.0–34.0)
MCH: 29.8 pg (ref 26.0–34.0)
MCHC: 34.2 g/dL (ref 30.0–36.0)
MCV: 87.9 fL (ref 78.0–100.0)
MCV: 86.2 fL (ref 78.0–100.0)
Platelets: 529 10*3/uL — ABNORMAL HIGH (ref 150–400)
Platelets: 511 10*3/uL — ABNORMAL HIGH (ref 150–400)
RBC: 2.75 MIL/uL — ABNORMAL LOW (ref 3.87–5.11)
RDW: 16 % — ABNORMAL HIGH (ref 11.5–15.5)
RDW: 15.6 % — ABNORMAL HIGH (ref 11.5–15.5)

## 2011-02-14 LAB — RETICULOCYTES: Retic Ct Pct: 13.2 % — ABNORMAL HIGH (ref 0.4–3.1)

## 2011-02-14 MED ORDER — LACTATED RINGERS IV SOLN
INTRAVENOUS | Status: DC
Start: 2011-02-14 — End: 2011-02-14

## 2011-02-14 MED ORDER — HYDROMORPHONE HCL 1 MG/ML IJ SOLN
1.0000 mg | Freq: Once | INTRAMUSCULAR | Status: AC
  Administered 2011-02-14: 1 mg via INTRAVENOUS
  Filled 2011-02-14: qty 1

## 2011-02-14 NOTE — Telephone Encounter (Signed)
Will you please call patient and let pt know that I will not be able to leave the clinic this afternoon.  We will need to reschedule her New OB appointment when she is feeling better. If she would like she can go ahead and reschedule the new ob appointment.

## 2011-02-14 NOTE — Telephone Encounter (Signed)
Will forward to Dr Caviness 

## 2011-02-14 NOTE — H&P (Signed)
Family Medicine Teaching [facility] Admission History and Physical  Patient name: [name] Medical record number: [id number] Date of birth: 02/21/1991 Age: 20 y.o. Gender: female  Primary Care Provider: [name], MD  Chief Complaint: Pain crisis History of Present Illness: [name] is a 20 y.o. year old female presenting with pain crisis, mostly in her right leg. Pt presented to [facility] yesterday evening with vaginal bleeding and headache. She was given Percocet for the headache, which helped her to sleep. When she woke up, her headache had improved but she felt intense pain in her right leg which is characteristic of her pain crisis pain. Pt has no other complaints or locations of pain. She states her abdomen hurt earlier, but that has since resolved. She also has back pain at baseline, but it is not a typical crisis pain. Pt feels her pain was brought on from working hard today and not eating or drinking well. She was given Dilaudid at [facility] and sent to [facility] for observation and IV pain medication.   Patient Active Problem List  Diagnoses  . SICKLE CELL ANEMIA  . TRICHOTILLOMANIA  . UPPER RESPIRATORY INFECTION, ACUTE  . DRY SKIN  . OTHER ACUTE POSTOPERATIVE PAIN  . Active smoker  . Scar  . Back pain  . Vaginal bleeding  . Depression  . Pregnancy as incidental finding   Past Medical History: Past Medical History  Diagnosis Date  . Sickle cell anemia with crisis   . Sickle cell anemia     Past Surgical History: Past Surgical History  Procedure Date  . Cholecystectomy   . Choley     Social History: She no longer smokes since she is pregnant.  She does not   drug alcohol or use illicit drugs.  She lives at home with her   boyfriend and works at [employer].  Family History: Some family members also carry trait.  Allergies: Allergies  Allergen Reactions  . Toradol Itching    Some itching, give with benedryl    No current  facility-administered medications for this encounter.   Facility-Administered Medications Ordered in Other Encounters  Medication Dose Route Frequency Provider Last Rate Last Dose  . HYDROmorphone (DILAUDID) injection 1 mg  1 mg Intravenous Once Pamelia Hoit, NP   1 mg at 02/14/11 0143  . ondansetron (ZOFRAN-ODT) disintegrating tablet 8 mg  8 mg Oral Once Pamelia Hoit, NP   8 mg at 02/13/11 2349  . oxyCODONE-acetaminophen (PERCOCET) 5-325 MG per tablet 1 tablet  1 tablet Oral Once Pamelia Hoit, NP   1 tablet at 02/13/11 2347  . DISCONTD: lactated ringers infusion   Intravenous Continuous Pamelia Hoit, NP       Review Of Systems: Per HPI with the following additions: No chest pain, SOB, rash Otherwise 12 point review of systems was performed and was unremarkable.  Physical Exam: Pulse: 87  Blood Pressure: 98/64 RR: 18   O2: 100 on RA Temp: 97.8  General: alert, cooperative and mild distress HEENT: PERRLA and oropharynx clear, no lesions Heart: S1, S2 normal, no murmur, rub or gallop, regular rate and rhythm Lungs: clear to auscultation, no wheezes or rales and unlabored breathing Abdomen: abdomen is soft without significant tenderness, masses, organomegaly or guarding Extremities: Moves all extremities, but guarded with right leg. No edema. Right knee is tender to palpation from mid thigh to just below the joint line. No marked swelling or erythema. Otherwise, no pain in any points or long bones. Skin:no rashes,  no wounds Neurology: normal without focal findings and mental status, speech normal, alert and oriented x3  Labs and Imaging: Lab Results  Component Value Date/Time   NA 137 01/29/2011  6:00 AM   K 3.6 01/29/2011  6:00 AM   CL 102 01/29/2011  6:00 AM   CO2 26 01/29/2011  6:00 AM   BUN 5* 01/29/2011  6:00 AM   CREATININE <0.47* 01/29/2011  6:00 AM   GLUCOSE 89 01/29/2011  6:00 AM   Lab Results  Component Value Date   WBC 18.5* 02/14/2011   HGB 8.2* 02/14/2011   HCT  24.0* 02/14/2011   MCV 87.9 02/14/2011   PLT 529* 02/14/2011  Retic: 13.2  Transvaginal U/S (02/13/11): IMPRESSION:  1. Single intrauterine pregnancy with measured crown length  corresponding to a gestational age of [redacted] weeks, 4 days, reflecting a  date of delivery of 09/21/2011. Cardiac activity detected with  heart rate of 172 beats per minute.  2. Small subchorionic hemorrhage.    Assessment and Plan: [name] is a 20 y.o. year old female presenting with sickle cell pain crisis of the right knee joint. 1. Vasoocclusive Pain Crisis: No other concerning symptoms. Will treat with Dilaudid IV 2mg  every 2 hours as needed as well as scheduled Tylenol every 8 hours scheduled for pain. Pt did not want a PCA, therefore she will ask her nurse for Dilaudid as needed. We will treat itching with Benadryl 25mg  IV q 4 hours as needed for itching. She has a WBC of 18.5 which is higher than her baseline. She is afebrile right now. If her knee continues to bother her, or if it becomes more swollen, we will consider an X-ray of knee tomorrow.  2. Sickle Cell Disease: Patient's Hgb was 8.2 on admission. Her last transfusion was 2 weeks ago. This is her baseline and there is no need for a transfusion at this time. We will recheck her CBC in the morning.  3. High Risk Pregnancy: Pt presented to [facility] for vaginal bleeding. She had a vaginal ultrasound which showed a viable [redacted]w[redacted]d pregnancy. We will continue prenatal vitamins and high dose folic acid. We will avoid NSAIDs for her treatment, as well as other medications that are not safe during pregnancy. She is scheduled for an OB appointment with Dr. Edmonia James today at [facility] to establish [facility] care. She has an appointment at high risk OB on Oct 4th. Pt also reported to her nurse that she thought she had a yeast infection. She is in pain at this time and unable to bend her leg. We will get her pain under control and then reassess for yeast infection and treat  appropriately. 4. FEN/GI: Regular diet. Miralax as needed for constipation 5. PPx: Heparin 5000U SQ TID, Protonix 40mg   6. Dispo: Discharge pending clinical improvement of pain.   Ashlie Mcmenamy M. [name], M.D. 02/14/11 at [phone]

## 2011-02-14 NOTE — H&P (Signed)
Family Medicine Teaching [facility] Admission History and Physical  Patient name: [name] Medical record number: [id number] Date of birth: 12/02/1990 Age: 20 y.o. Gender: female  Primary Care Provider: [name], MD  Chief Complaint: Sickle Cell pain History of Present Illness: [name] is a 20 y.o. 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  Of note she is currently pregnant.  Today when she got home from work she noticed that she had some spotting when she wiped after going to the bathroom.  She decided to go to [facility] at that point for evaluation.  While at [facility] she began to have severe pain in her right knee that felt like her typical pain crisis.  She received dilaudid and percocet there which helped with her pain some.  She also had a headache earlier in the day that has improved and had some mild abdominal pain on the way over from [facility] but that has since resolved.  She thinks her pain crisis may have been triggered from her bleeding as well as her not hydrating as she should have today.  She denies chest pain, fever, chills, shortness of breath, nausea, diarrhea.  Please see excellent R1 note for PMH, Surg. History, Social History, Family History, Meds and allergies.  Review Of Systems: Per HPI  Otherwise 12 point review of systems was performed and was unremarkable.  Physical Exam: Pulse: 87  Blood Pressure: 98/64 RR: 18   O2: 100% on RA Temp: 97.8  General: alert, cooperative and mild distress HEENT: PERRLA, extra ocular movement intact and sclera clear, anicteric Heart: S1, S2 normal, no murmur, rub or gallop, regular rate and rhythm Lungs: clear to auscultation, no wheezes or rales and unlabored breathing Abdomen: abdomen is soft without significant tenderness, masses, organomegaly or guarding Extremities: R knee tender to touch.  Difficulty with straightening leg, no swelling or erythema.  L  leg non painful.   Skin:no rashes Neurology: normal without focal findings and mental status, speech normal, alert and oriented x3  Labs and Imaging: CBC    Component Value Date/Time   WBC 18.5* 02/14/2011 0131   RBC 2.73* 02/14/2011 0131   HGB 8.2* 02/14/2011 0131   HCT 24.0* 02/14/2011 0131   PLT 529* 02/14/2011 0131   MCV 87.9 02/14/2011 0131   MCH 30.0 02/14/2011 0131   MCHC 34.2 02/14/2011 0131   RDW 16.0* 02/14/2011 0131   LYMPHSABS 4.3* 02/14/2011 0131   MONOABS 1.7* 02/14/2011 0131   EOSABS 0.6 02/14/2011 0131   BASOSABS 0.1 02/14/2011 0131   Retic: 13.2%    Assessment and Plan: [name] is a 20 y.o. year old female presenting with sickle cell pain crisis 1. HgbSS disease:  Seems to be sickle cell pain crisis.  Hgb is around baseline.  Will admit to telemetry bed and  treat acutely with scheduled tylenol as well as prn dilaudid. Patient requests not to have PCA at this time. Will avoid NSAIDS given that she is pregnant.  No signs of acute chest syndrome.  Will try to avoid X-rays if at all possible given her early pregnancy.  Recheck CBC and chemistry in the am.  2. Pregnancy:  Ultrasound shows viable pregnancy at 8wk 4/7 days gestation.  Continue folic acid and prenatal vitamin supplementation.  She is supposed to meet with Dr. Edmonia James for initial prenatal appt.today.  Will need to reschedule to be seen asap after discharge.  3. FEN/GI: Regular diet, NS  at 160ml/hr.   4. Prophylaxis: Heparin 5000 units SQ tid, Miralax for bowels 5. Disposition: Pending improvement of pain.

## 2011-02-14 NOTE — Telephone Encounter (Signed)
[name] is in the hospital and will not be able to keep her appt today.  She feels it is very important to see and speak to Dr. Edmonia James before 10/4.  She was hoping it may be possible for Dr. Edmonia James to come over to the hospital during her scheduled appt time which was 2:30.

## 2011-02-15 ENCOUNTER — Observation Stay (HOSPITAL_COMMUNITY): Payer: Medicaid Other

## 2011-02-15 LAB — COMPREHENSIVE METABOLIC PANEL
ALT: 13 U/L (ref 0–35)
AST: 25 U/L (ref 0–37)
Alkaline Phosphatase: 47 U/L (ref 39–117)
CO2: 23 mEq/L (ref 19–32)
Chloride: 105 mEq/L (ref 96–112)
Potassium: 3.9 mEq/L (ref 3.5–5.1)
Sodium: 137 mEq/L (ref 135–145)
Total Bilirubin: 2.9 mg/dL — ABNORMAL HIGH (ref 0.3–1.2)

## 2011-02-15 LAB — CBC
MCV: 86.1 fL (ref 78.0–100.0)
Platelets: 501 10*3/uL — ABNORMAL HIGH (ref 150–400)
RBC: 2.51 MIL/uL — ABNORMAL LOW (ref 3.87–5.11)
WBC: 19.1 10*3/uL — ABNORMAL HIGH (ref 4.0–10.5)

## 2011-02-15 NOTE — ED Provider Notes (Signed)
Agree with above note.  Nancy Hollett H. 02/15/2011 5:10 PM

## 2011-02-16 LAB — CBC
HCT: 20.3 % — ABNORMAL LOW (ref 36.0–46.0)
Hemoglobin: 7.2 g/dL — ABNORMAL LOW (ref 12.0–15.0)
MCH: 30.8 pg (ref 26.0–34.0)
MCHC: 35.5 g/dL (ref 30.0–36.0)
RBC: 2.34 MIL/uL — ABNORMAL LOW (ref 3.87–5.11)

## 2011-02-16 LAB — DIFFERENTIAL
Lymphocytes Relative: 22 % (ref 12–46)
Monocytes Absolute: 1.4 10*3/uL — ABNORMAL HIGH (ref 0.1–1.0)
Monocytes Relative: 8 % (ref 3–12)
Neutro Abs: 11.4 10*3/uL — ABNORMAL HIGH (ref 1.7–7.7)

## 2011-02-16 LAB — GC/CHLAMYDIA PROBE AMP, GENITAL: Chlamydia, DNA Probe: NEGATIVE

## 2011-02-17 ENCOUNTER — Ambulatory Visit (INDEPENDENT_AMBULATORY_CARE_PROVIDER_SITE_OTHER): Payer: Medicaid Other | Admitting: Obstetrics and Gynecology

## 2011-02-17 ENCOUNTER — Inpatient Hospital Stay (EMERGENCY_DEPARTMENT_HOSPITAL)
Admission: AD | Admit: 2011-02-17 | Discharge: 2011-02-17 | Disposition: A | Discharge status: Home / Self Care | Payer: Medicaid Other | Source: Ambulatory Visit | Attending: Obstetrics & Gynecology | Admitting: Obstetrics & Gynecology

## 2011-02-17 VITALS — BP 108/72 | Temp 99.4°F | Wt 167.5 lb

## 2011-02-17 DIAGNOSIS — K5900 Constipation, unspecified: Secondary | ICD-10-CM | POA: Insufficient documentation

## 2011-02-17 DIAGNOSIS — O99340 Other mental disorders complicating pregnancy, unspecified trimester: Secondary | ICD-10-CM

## 2011-02-17 DIAGNOSIS — O26859 Spotting complicating pregnancy, unspecified trimester: Secondary | ICD-10-CM | POA: Insufficient documentation

## 2011-02-17 DIAGNOSIS — N898 Other specified noninflammatory disorders of vagina: Secondary | ICD-10-CM

## 2011-02-17 DIAGNOSIS — F329 Major depressive disorder, single episode, unspecified: Secondary | ICD-10-CM

## 2011-02-17 DIAGNOSIS — O99891 Other specified diseases and conditions complicating pregnancy: Secondary | ICD-10-CM | POA: Insufficient documentation

## 2011-02-17 DIAGNOSIS — K219 Gastro-esophageal reflux disease without esophagitis: Secondary | ICD-10-CM

## 2011-02-17 DIAGNOSIS — F17200 Nicotine dependence, unspecified, uncomplicated: Secondary | ICD-10-CM

## 2011-02-17 DIAGNOSIS — R109 Unspecified abdominal pain: Secondary | ICD-10-CM | POA: Insufficient documentation

## 2011-02-17 DIAGNOSIS — N939 Abnormal uterine and vaginal bleeding, unspecified: Secondary | ICD-10-CM

## 2011-02-17 DIAGNOSIS — F32A Depression, unspecified: Secondary | ICD-10-CM

## 2011-02-17 DIAGNOSIS — O0990 Supervision of high risk pregnancy, unspecified, unspecified trimester: Secondary | ICD-10-CM

## 2011-02-17 DIAGNOSIS — O99330 Smoking (tobacco) complicating pregnancy, unspecified trimester: Secondary | ICD-10-CM

## 2011-02-17 DIAGNOSIS — O210 Mild hyperemesis gravidarum: Secondary | ICD-10-CM | POA: Insufficient documentation

## 2011-02-17 DIAGNOSIS — D571 Sickle-cell disease without crisis: Secondary | ICD-10-CM

## 2011-02-17 HISTORY — DX: Gastro-esophageal reflux disease without esophagitis: K21.9

## 2011-02-17 LAB — POCT URINALYSIS DIP (DEVICE)
Glucose, UA: NEGATIVE mg/dL
Ketones, ur: NEGATIVE mg/dL
Specific Gravity, Urine: 1.015 (ref 1.005–1.030)
Urobilinogen, UA: 1 mg/dL (ref 0.0–1.0)

## 2011-02-17 LAB — URINALYSIS, ROUTINE W REFLEX MICROSCOPIC
Ketones, ur: NEGATIVE mg/dL
Leukocytes, UA: NEGATIVE
Protein, ur: NEGATIVE mg/dL
Urobilinogen, UA: 1 mg/dL (ref 0.0–1.0)

## 2011-02-17 LAB — CBC
HCT: 22.6 % — ABNORMAL LOW (ref 36.0–46.0)
Hemoglobin: 7.6 g/dL — ABNORMAL LOW (ref 12.0–15.0)
MCH: 30.3 pg (ref 26.0–34.0)
MCHC: 33.6 g/dL (ref 30.0–36.0)
RBC: 2.51 MIL/uL — ABNORMAL LOW (ref 3.87–5.11)

## 2011-02-17 LAB — URINE MICROSCOPIC-ADD ON

## 2011-02-17 MED ORDER — GI COCKTAIL ~~LOC~~: 30.0000 mL | Freq: Once | ORAL | Status: DC

## 2011-02-17 MED ORDER — GI COCKTAIL ~~LOC~~
30.0000 mL | Freq: Once | ORAL | Status: AC
  Administered 2011-02-17: 30 mL via ORAL
  Filled 2011-02-17: qty 30

## 2011-02-17 MED ORDER — RANITIDINE HCL 150 MG PO TABS: 150.0000 mg | ORAL_TABLET | Freq: Two times a day (BID) | ORAL | Status: DC

## 2011-02-17 MED ORDER — ONDANSETRON 8 MG PO TBDP
8.0000 mg | ORAL_TABLET | Freq: Once | ORAL | Status: AC
  Administered 2011-02-17: 8 mg via ORAL
  Filled 2011-02-17: qty 1

## 2011-02-17 MED ORDER — PROMETHAZINE HCL 25 MG PO TABS: 25.0000 mg | ORAL_TABLET | Freq: Four times a day (QID) | ORAL | Status: DC | PRN

## 2011-02-17 NOTE — Progress Notes (Signed)
Pt states feels better.

## 2011-02-17 NOTE — Progress Notes (Signed)
Significant other talked for pt. States D/Ced from [facility] today. "weak" all day. States she passed out at home. States "chest hurts" but points to just below throat. States has been throwing up a lot since Tues.

## 2011-02-17 NOTE — ED Notes (Signed)
Up to [facility] via w/c to obtain u/a

## 2011-02-17 NOTE — Progress Notes (Signed)
   Subjective:    [name] is a G1P0 [redacted]w[redacted]d being seen today for her first obstetrical visit.  Her obstetrical history is significant for sickle cell disease. Patient was admitted to [facility] on 10/1-10/3 secondary to a sickle cell crisis. Patient is doing well in terms of pain. Patient does intend to breast feed. Pregnancy history fully reviewed.  Patient reports bleeding. Patient has been bleeding for the past 3 days. The bleeding seems to be improving and is reduced to brownish spotting. Ultrasound 2 days ago demonstrated a small subchorionic hemorrhage.  Filed Vitals:   02/17/11 0917  BP: 108/72  Temp: 99.4 F (37.4 C)  Weight: 167 lb 8 oz (75.978 kg)    HISTORY: OB History    Grav Para Term Preterm Abortions TAB SAB Ect Mult Living   1              # Outc Date GA Lbr Len/2nd Wgt Sex Del Anes PTL Lv   1 CUR              Past Medical History  Diagnosis Date  . Sickle cell anemia with crisis   . Sickle cell anemia    Past Surgical History  Procedure Date  . Cholecystectomy   . Choley    No family history on file.  History  Substance Use Topics  . Smoking status: Former Smoker    Types: Cigars  . Smokeless tobacco: Never Used  . Alcohol Use: No     Exam    Uterine Size: size equals dates  Pelvic Exam:    Perineum: Normal Perineum   Vulva: normal   Vagina:  normal mucosa, normal discharge   pH:    Cervix: closed and long   Adnexa: normal adnexa and no mass, fullness, tenderness   Bony Pelvis: adequate  System: Breast:  normal appearance, no masses or tenderness, No nipple discharge or bleeding   Skin: normal coloration and turgor, no rashes    Neurologic: oriented, grossly normal   Extremities: NT, equal in size   HEENT PERRLA   Mouth/Teeth mucous membranes moist, pharynx normal without lesions   Neck supple and no masses   Cardiovascular: regular rate and rhythm   Respiratory:  chest clear, no wheezing, crepitations, rhonchi, normal  symmetric air entry   Abdomen: soft, non-tender; bowel sounds normal; no masses,  no organomegaly   Urinary:       Assessment:    Pregnancy: G1P0 Patient Active Problem List  Diagnoses  . SICKLE CELL ANEMIA  . TRICHOTILLOMANIA  . UPPER RESPIRATORY INFECTION, ACUTE  . DRY SKIN  . OTHER ACUTE POSTOPERATIVE PAIN  . Active smoker  . Scar  . Back pain  . Vaginal bleeding  . Depression  . Pregnancy as incidental finding  . Pregnancy, supervision of, high-risk  . GERD (gastroesophageal reflux disease)  . Constipation        Plan:     Initial labs drawn. Prenatal vitamins. Problem list reviewed and updated. Genetic Screening discussed First Screen: requested.  Ultrasound discussed; fetal survey: will be ordered.  Follow up in 4 weeks.     Izreal Kock 02/17/2011

## 2011-02-17 NOTE — Progress Notes (Signed)
Pt reports she was discharged from [place] today after sickle cell crisis. Pt reports she is 9 weeks preg and has had vaginal bleeding for 3 days. Pt sig other reports the pt "stopped breathing at home" and he put her in the car and brought her here. When ask if he called 911 when she stopped breathing he said no because she came back around. This all happened about 30 minutes ago, pt states she is having pain in her rt side, rt upper abd, states she has had this pain since before she was admitted at [facility] on 10/01

## 2011-02-17 NOTE — Progress Notes (Signed)
Written and verbal d/c instructions given and understanding voiced. 

## 2011-02-17 NOTE — ED Notes (Signed)
[name] CNM in with bedside u/s. FHR seen. Pt reassured. Pt states lower abd feels better since having BM.

## 2011-02-17 NOTE — Patient Instructions (Signed)
Pregnancy - First Trimester During sexual intercourse, millions of sperm go into the vagina. Only 1 sperm will penetrate and fertilize the female egg while it is in the Fallopian tube. One week later, the fertilized egg implants into the wall of the uterus. An embryo begins to develop into a baby. At 6 to 8 weeks, the eyes and face are formed and the heartbeat can be seen on ultrasound. At the end of 12 weeks (first trimester), all the baby's organs are formed. Now that you are pregnant, you will want to do everything you can to have a healthy baby. Two of the most important things are to get good prenatal care and follow your caregiver's instructions. Prenatal care is all the medical care you receive before the baby's birth. It is given to prevent, find and treat problems during the pregnancy and childbirth. PRENATAL EXAMS:  During prenatal visits, your weight, blood pressure and urine are checked. This is done to make sure you are healthy and progressing normally during the pregnancy.   A pregnant woman should gain 25 to 35 pounds during the pregnancy. However, if you are over weight or underweight, your caregiver will advise you regarding your weight.   Your caregiver will ask and answer questions for you.   Blood work, cervical cultures, other necessary tests and a Pap test are done during your prenatal exams. These tests are done to check on your health and the probable health of your baby. Tests are strongly recommended and done for HIV with your permission. This is the virus that causes AIDS. These tests are done because medications can be given to help prevent your baby from being born with this infection should you have been infected without knowing it. Blood work is also used to find out your blood type, previous infections and follow your blood levels (hemoglobin).   Low hemoglobin (anemia) is common during pregnancy. Iron and vitamins are given to help prevent this. Later in the pregnancy,  blood tests for diabetes will be done along with any other tests if any problems develop. You may need tests to make sure you and the baby are doing well.   You may need other tests to make sure you and the baby are doing well.  CHANGES DURING THE FIRST TRIMESTER (THE FIRST 3 MONTHS OF PREGNANCY) Your body goes through many changes during pregnancy. They vary from person to person. Talk to your caregiver about changes you notice and are concerned about. Changes can include:  Your menstrual period stops.   The egg and sperm carry the genes that determine what you look like. Genes from you and your partner are forming a baby. The female genes determine whether the baby is a boy or a girl.   Your body increases in girth and you may feel bloated.   Feeling sick to your stomach (nauseous) and throwing up (vomiting). If the vomiting is uncontrollable, call your caregiver.   Your breasts will begin to enlarge and become tender.   Your nipples may stick out more and become darker.   The need to urinate more. Painful urination may mean you have a bladder infection.   Tiring easily.   Loss of appetite.   Cravings for certain kinds of food.   At first, you may gain or lose a couple of pounds.   You may have changes in your emotions from day to day (excited to be pregnant or concerned something may go wrong with the pregnancy and baby).     You may have more vivid and strange dreams.  HOME CARE INSTRUCTIONS  It is very important to avoid all smoking, alcohol and un-prescribed drugs during your pregnancy. These affect the formation and growth of the baby. Avoid chemicals while pregnant to ensure the delivery of a healthy infant.   Start your prenatal visits by the 12th week of pregnancy. They are usually scheduled monthly at first, then more often in the last 2 months before delivery. Keep your caregiver's appointments. Follow your caregiver's instructions regarding medication use, blood and lab  tests, exercise, and diet.   During pregnancy, you are providing food for you and your baby. Eat regular, well-balanced meals. Choose foods such as meat, fish, milk and other low fat dairy products, vegetables, fruits, and whole-grain breads and cereals. Your caregiver will tell you of the ideal weight gain.   You can help morning sickness by keeping soda crackers (saltines) at the bedside. Eat a couple before arising in the morning. You may want to use the crackers without salt on them.   Eating 4 to 5 small meals rather than 3 large meals a day also may help the nausea and vomiting.   Drinking liquids between meals instead of during meals also seems to help nausea and vomiting.   A physical sexual relationship may be continued throughout pregnancy if there are no other problems. Problems may be early (premature) leaking of amniotic fluid from the membranes, vaginal bleeding, or belly (abdominal) pain.   Exercise regularly if there are no restrictions. Check with your caregiver or physical therapist if you are unsure of the safety of some of your exercises. Greater weight gain will occur in the last 2 trimesters of pregnancy. Exercising will help:   Control your weight.   Keep you in shape.   Prepare you for labor and delivery.   Help you lose your pregnancy weight after you deliver your baby.   Wear a good support or jogging bra for breast tenderness during pregnancy. This may help if worn during sleep too.   Ask when prenatal classes are available. Begin classes when they are offered.   Do not use hot tubs, steam rooms or saunas.   Wear your seat belt when driving. This protects you and your baby if you are in an accident.   Avoid raw meat, uncooked cheese, cat litter boxes and soil used by cats throughout the pregnancy. These carry germs that can cause birth defects in the baby.   The first trimester is a good time to visit your dentist for your dental health. Getting your teeth  cleaned is OK. Use a softer toothbrush and brush gently during pregnancy.   Ask for help if you have financial, counseling or nutritional needs during pregnancy. Your caregiver will be able to offer counseling for these needs as well as refer you for other special needs.   Do not take any medications or herbs unless told by your caregiver.   Inform your caregiver if there is any mental or physical domestic violence.   Make a list of emergency phone numbers of family, friends, hospital, police and fire department.   Write down your questions. Take them to your prenatal visit.   Do not douche.   Do not cross your legs.   If you have to stand for long periods of time, rotate you feet or take small steps in a circle.   You may have more vaginal secretions that may require a sanitary pad. Do not use tampons   or scented sanitary pads.  MEDICATIONS AND DRUG USE IN PREGNANCY  Take prenatal vitamins as directed. The vitamin should contain 1 milligram of folic acid. Keep all vitamins out of reach of children. Only a couple vitamins or tablets containing iron may be fatal to a baby or young child when ingested.   Avoid use of all medications, including herbs, over-the-counter medications, not prescribed or suggested by your caregiver. Only take over-the-counter or prescription medicines for pain, discomfort, or fever as directed by your caregiver. Do not use aspirin, ibuprofen (Motrin, Advil, Nuprin) or naproxen (Aleve) unless OK'd by your caregiver.   Let your caregiver also know about herbs you may be using.   Alcohol is related to a number of birth defects. This includes fetal alcohol syndrome. All alcohol, in any form, should be avoided completely. Smoking will cause low birth rate and premature babies.   Street/illegal drugs are very harmful to the baby. They are absolutely forbidden. A baby born to an addicted mother will be addicted at birth. The baby will go through the same withdrawal  an adult does.   Let your caregiver know about any medications that you have to take and for what reason you take them.  MISCARRIAGE IS COMMON DURING PREGNANCY A miscarriage does not mean you did something wrong. It is not a reason to worry about getting pregnant again. Your caregiver will help you with questions you may have. If you have a miscarriage, you may need minor surgery (a D & C). SEEK MEDICAL CARE IF:  You have any concerns or worries during your pregnancy. It is better to call with your questions if you feel they cannot wait, rather than worry about them.  SEEK IMMEDIATE MEDICAL CARE IF:  An unexplained oral temperature above 100.4 develops, or as your caregiver suggests.   You have leaking of fluid from the vagina (birth canal). If leaking membranes are suspected, take your temperature and inform your caregiver of this when you call.   There is vaginal spotting or bleeding. Notify your caregiver of the amount and how many pads are used.   You develop a bad smelling vaginal discharge with a change in the color.   You continue to feel sick to your stomach (nauseated) and have no relief from remedies suggested. You vomit blood or coffee ground like materials.   You lose more than 2 pounds of weight in one week.   You gain more than 2 pounds of weight in a week and you notice swelling of your face, hands, feet or legs.   You gain 5 pounds or more in 1 week (even if you do not have swelling of your hands, face, legs or feet).   You get exposed to German measles and have never had them.   You are exposed to fifth disease or chicken pox.   You develop belly (abdominal) pain. Round ligament discomfort is a common non-cancerous (benign) cause of abdominal pain in pregnancy. Your caregiver still must evaluate this.   You develop headache, fever, diarrhea, pain with urination, or shortness of breath.   You fall, are in a car accident or have any kind of trauma.   There is mental  or physical violence in your home.  Document Released: 04/26/2001 Document Re-Released: 10/20/2009 [employer] Patient Information 2011 [employer]. 

## 2011-02-17 NOTE — Progress Notes (Signed)
Pain-lower abd.  Vaginal bleeding, bright red, for x 2 days pt wears pad and changes about q 3 hours. Pt has pad now.  Pt has already had flu vaccine from [facility].

## 2011-02-17 NOTE — ED Provider Notes (Signed)
History     Chief Complaint  Patient presents with  . Abdominal Pain   HPI 20 y.o. G1P0 at [redacted]w[redacted]d. Here c/o ongoing vaginal bleeding and abdominal pain. Has had vaginal bleeding x several days, has had multiple u/s since onset of pregnancy to eval. Last u/s was on 10/1 showing a stable subchorionic hemorrhage which had been seen on prior u/s. The abdominal and substernal pain has been ongoing for at least 4 days as well. Describes epigastric pain and pain in chest, feels like tightness, accompanied by gagging and coughing at times. Also c/o generalized abdominal pain. Constipation - no bm x 4 days, started miralax today. Has had n/v since Tuesday, not taking any meds. Was discharged today from [facility] for sickle cell crisis, was having this pain and bleeding throughout her stay at [facility] - u/s was performed there, pain was attributed to sickle cell. Pt's partner also states that she "passed out" tonight - he was walking her to car and she appeared to him to lose conciousness, although he was able to lean her against the car and she did not fall to ground. He states that she coughed and came back around after he put her in the car. Has OB appt scheduled with [facility] as well as appt in [facility] later today.     OB History    Grav Para Term Preterm Abortions TAB SAB Ect Mult Living   1               Past Medical History  Diagnosis Date  . Sickle cell anemia with crisis   . Sickle cell anemia     Past Surgical History  Procedure Date  . Cholecystectomy   . Choley     No family history on file.  History  Substance Use Topics  . Smoking status: Former Smoker    Types: Cigars  . Smokeless tobacco: Never Used  . Alcohol Use: No    Allergies:  Allergies  Allergen Reactions  . Toradol Itching    Some itching, give with benedryl    Prescriptions prior to admission  Medication Sig Dispense Refill  . folic acid (FOLVITE) 1 MG tablet Take 1 mg by mouth daily.       .  prenatal vitamin w/FE, FA (PRENATAL 1 + 1) 27-1 MG TABS Take 1 tablet by mouth daily.        Marland Kitchen DISCONTD: buPROPion (WELLBUTRIN XL) 150 MG 24 hr tablet Take 1 tablet (150 mg total) by mouth every morning.  30 tablet  2    Review of Systems  Constitutional: Negative.   Respiratory: Negative.   Cardiovascular: Negative.   Gastrointestinal: Positive for heartburn, nausea, vomiting and abdominal pain. Negative for diarrhea and constipation.  Genitourinary: Negative for dysuria, urgency, frequency, hematuria and flank pain.       Negative for cramping/contractions, positive for spotting  Musculoskeletal: Positive for back pain (ongoing, prior to preg).  Neurological: Negative.   Psychiatric/Behavioral: Negative.    Physical Exam   Blood pressure 115/69, pulse 86, temperature 99 F (37.2 C), resp. rate 18, last menstrual period 12/14/2010, SpO2 98.00%.  Physical Exam  Constitutional: She is oriented to person, place, and time. She appears well-developed and well-nourished. She appears distressed.  Cardiovascular: Normal rate, regular rhythm and normal heart sounds.   Respiratory: Effort normal and breath sounds normal. No respiratory distress.  GI: Soft. Bowel sounds are normal. She exhibits no distension and no mass. There is tenderness (epigastric and  suprapubic). There is no rebound and no guarding.  Genitourinary: There is bleeding (scant blood on pad) around the vagina.  Musculoskeletal: Normal range of motion.  Neurological: She is alert and oriented to person, place, and time.  Skin: Skin is warm and dry.  Psychiatric:       Anxious appearing     MAU Course  Procedures Had BM in MAU - reports "pain in butt" is relieved  Results for orders placed during the hospital encounter of 02/17/11 (from the past 24 hour(s))  CBC     Status: Abnormal   Collection Time   02/17/11  1:10 AM      Component Value Range   WBC 17.9 (*) 4.0 - 10.5 (K/uL)   RBC 2.51 (*) 3.87 - 5.11 (MIL/uL)    Hemoglobin 7.6 (*) 12.0 - 15.0 (g/dL)   HCT 40.9 (*) 81.1 - 46.0 (%)   MCV 90.0  78.0 - 100.0 (fL)   MCH 30.3  26.0 - 34.0 (pg)   MCHC 33.6  30.0 - 36.0 (g/dL)   RDW 91.4 (*) 78.2 - 15.5 (%)   Platelets 513 (*) 150 - 400 (K/uL)  URINALYSIS, ROUTINE W REFLEX MICROSCOPIC     Status: Abnormal   Collection Time   02/17/11  1:45 AM      Component Value Range   Color, Urine YELLOW  YELLOW    Appearance CLEAR  CLEAR    Specific Gravity, Urine 1.010  1.005 - 1.030    pH 6.5  5.0 - 8.0    Glucose, UA NEGATIVE  NEGATIVE (mg/dL)   Hgb urine dipstick MODERATE (*) NEGATIVE    Bilirubin Urine NEGATIVE  NEGATIVE    Ketones, ur NEGATIVE  NEGATIVE (mg/dL)   Protein, ur NEGATIVE  NEGATIVE (mg/dL)   Urobilinogen, UA 1.0  0.0 - 1.0 (mg/dL)   Nitrite NEGATIVE  NEGATIVE    Leukocytes, UA NEGATIVE  NEGATIVE   URINE MICROSCOPIC-ADD ON     Status: Abnormal   Collection Time   02/17/11  1:45 AM      Component Value Range   Squamous Epithelial / LPF RARE  RARE    WBC, UA 0-2  <3 (WBC/hpf)   RBC / HPF 3-6  <3 (RBC/hpf)   Bacteria, UA FEW (*) RARE    Hgb on 10/3 was 7.2, WBC 16.9, platelets also elevated on recent CBCs  +FHR on bedside u/s  Pt reports significant improvement following GI cocktail  Assessment and Plan  20 y.o. G1P0 at [redacted]w[redacted]d GERD - rx ranitidine N/V of pregnancy - rx phenergan Constipation, chronic - continue Miralax as prescribed, encouraged to take more regularly Spotting d/t subchorionic hemorrhage  Sickle Cell Discussed diet, eat small frequent meals, drink plendy of fluids Has appt this morning in [facility] - pt plans to keep this appointment   Lafe Clerk 02/17/2011, 3:04 AM

## 2011-02-18 LAB — GC/CHLAMYDIA PROBE AMP, URINE
Chlamydia, Swab/Urine, PCR: NEGATIVE
GC Probe Amp, Urine: NEGATIVE

## 2011-02-18 LAB — COMPREHENSIVE METABOLIC PANEL
Albumin: 4.1
Alkaline Phosphatase: 54
BUN: 2 — ABNORMAL LOW
Creatinine, Ser: 0.6
Glucose, Bld: 88
Potassium: 3.3 — ABNORMAL LOW
Total Bilirubin: 4.3 — ABNORMAL HIGH
Total Protein: 7.9

## 2011-02-18 LAB — RETICULOCYTES
RBC.: 2.83 — ABNORMAL LOW
Retic Ct Pct: 7.6 — ABNORMAL HIGH

## 2011-02-18 LAB — URINALYSIS, ROUTINE W REFLEX MICROSCOPIC
Glucose, UA: NEGATIVE
Ketones, ur: 15 — AB
Protein, ur: 30 — AB
Urobilinogen, UA: >8 — ABNORMAL HIGH

## 2011-02-18 LAB — CBC
HCT: 24.5 — ABNORMAL LOW
Hemoglobin: 8.6 — ABNORMAL LOW
MCHC: 34.9
MCV: 86.2
Platelets: 430 — ABNORMAL HIGH
RDW: 19.3 — ABNORMAL HIGH

## 2011-02-18 LAB — DIFFERENTIAL
Basophils Absolute: 0.1
Basophils Relative: 1
Lymphocytes Relative: 16 — ABNORMAL LOW
Monocytes Absolute: 1.1
Monocytes Relative: 11
Neutro Abs: 6.7
Neutrophils Relative %: 71 — ABNORMAL HIGH
nRBC: 31 — ABNORMAL HIGH

## 2011-02-18 LAB — OBSTETRIC PANEL
Antibody Screen: NEGATIVE
Basophils Absolute: 0 10*3/uL (ref 0.0–0.1)
Basophils Relative: 0 % (ref 0–1)
HCT: 24.9 % — ABNORMAL LOW (ref 36.0–46.0)
Hepatitis B Surface Ag: NEGATIVE
Lymphocytes Relative: 15 % (ref 12–46)
MCHC: 34.9 g/dL (ref 30.0–36.0)
Monocytes Absolute: 1.6 10*3/uL — ABNORMAL HIGH (ref 0.1–1.0)
Neutro Abs: 12.6 10*3/uL — ABNORMAL HIGH (ref 1.7–7.7)
Neutrophils Relative %: 73 % (ref 43–77)
Platelets: 577 10*3/uL — ABNORMAL HIGH (ref 150–400)
RDW: 17.6 % — ABNORMAL HIGH (ref 11.5–15.5)
Rubella: 6 IU/mL — ABNORMAL HIGH
WBC: 17.1 10*3/uL — ABNORMAL HIGH (ref 4.0–10.5)

## 2011-02-18 LAB — URINE CULTURE
Colony Count: NO GROWTH
Culture: NO GROWTH

## 2011-02-18 LAB — URINE MICROSCOPIC-ADD ON

## 2011-02-20 ENCOUNTER — Inpatient Hospital Stay (HOSPITAL_COMMUNITY)
Admission: EM | Admit: 2011-02-20 | Discharge: 2011-02-22 | DRG: 781 | Disposition: A | Discharge status: Home / Self Care | Payer: Medicaid Other | Source: Ambulatory Visit | Attending: Family Medicine | Admitting: Family Medicine

## 2011-02-20 ENCOUNTER — Other Ambulatory Visit: Payer: Self-pay | Admitting: Emergency Medicine

## 2011-02-20 ENCOUNTER — Emergency Department (HOSPITAL_COMMUNITY): Payer: Medicaid Other

## 2011-02-20 DIAGNOSIS — O99891 Other specified diseases and conditions complicating pregnancy: Secondary | ICD-10-CM | POA: Diagnosis present

## 2011-02-20 DIAGNOSIS — D571 Sickle-cell disease without crisis: Secondary | ICD-10-CM

## 2011-02-20 DIAGNOSIS — O99019 Anemia complicating pregnancy, unspecified trimester: Principal | ICD-10-CM | POA: Diagnosis present

## 2011-02-20 DIAGNOSIS — D5700 Hb-SS disease with crisis, unspecified: Secondary | ICD-10-CM | POA: Diagnosis present

## 2011-02-20 DIAGNOSIS — D62 Acute posthemorrhagic anemia: Secondary | ICD-10-CM

## 2011-02-20 DIAGNOSIS — O039 Complete or unspecified spontaneous abortion without complication: Secondary | ICD-10-CM

## 2011-02-20 DIAGNOSIS — E876 Hypokalemia: Secondary | ICD-10-CM | POA: Diagnosis present

## 2011-02-20 HISTORY — PX: DILATION AND CURETTAGE OF UTERUS: SHX78

## 2011-02-20 LAB — CBC
HCT: 24.2 % — ABNORMAL LOW (ref 36.0–46.0)
HCT: 17.4 % — ABNORMAL LOW (ref 36.0–46.0)
Hemoglobin: 6.1 g/dL — CL (ref 12.0–15.0)
MCH: 31 pg (ref 26.0–34.0)
MCH: 30.5 pg (ref 26.0–34.0)
MCHC: 35.5 g/dL (ref 30.0–36.0)
MCV: 87.4 fL (ref 78.0–100.0)
MCV: 87 fL (ref 78.0–100.0)
RBC: 2 MIL/uL — ABNORMAL LOW (ref 3.87–5.11)
RDW: 16.9 % — ABNORMAL HIGH (ref 11.5–15.5)
WBC: 15.5 10*3/uL — ABNORMAL HIGH (ref 4.0–10.5)

## 2011-02-20 LAB — DIFFERENTIAL
Basophils Relative: 0 % (ref 0–1)
Eosinophils Absolute: 0.3 10*3/uL (ref 0.0–0.7)
Lymphs Abs: 2.3 10*3/uL (ref 0.7–4.0)
Monocytes Absolute: 1.4 10*3/uL — ABNORMAL HIGH (ref 0.1–1.0)
Neutro Abs: 11.5 10*3/uL — ABNORMAL HIGH (ref 1.7–7.7)

## 2011-02-20 LAB — BASIC METABOLIC PANEL
BUN: 4 mg/dL — ABNORMAL LOW (ref 6–23)
Calcium: 8.9 mg/dL (ref 8.4–10.5)
Glucose, Bld: 99 mg/dL (ref 70–99)
Sodium: 137 mEq/L (ref 135–145)

## 2011-02-20 LAB — URINALYSIS, ROUTINE W REFLEX MICROSCOPIC
Glucose, UA: NEGATIVE mg/dL
Leukocytes, UA: NEGATIVE
Protein, ur: NEGATIVE mg/dL
Specific Gravity, Urine: 1.01 (ref 1.005–1.030)
Urobilinogen, UA: 0.2 mg/dL (ref 0.0–1.0)

## 2011-02-20 LAB — WET PREP, GENITAL: Trich, Wet Prep: NONE SEEN

## 2011-02-20 LAB — RETICULOCYTES: Retic Ct Pct: 16.4 % — ABNORMAL HIGH (ref 0.4–3.1)

## 2011-02-20 NOTE — H&P (Signed)
Family Medicine Teaching [facility] Admission History and Physical  Patient name: ALEK PONCEDELEON Medical record number: [id number] Date of birth: 12/05/1990 Age: 20 y.o. Gender: female  Primary Care Provider: [name], MD  Chief Complaint: Abdominal Pain - Sickle Cell Pain Crisis  History of Present Illness:  Jhade Berko Counihan is a 20 y.o. year old female presenting with 2 day history of vague abdominal pain that has progressed into a typical sickle cell pain crisis located in her abdomen.  Of significance she is pregnant at 9.5 weeks.  She does have some vaginal bleeding and complains that some of her abdominal pain is crampy in nature.  A pelvic exam in the ED revealed a closed cervical os with blood in the vaginal vault.  She has been previously found to have a stable subchorionic hemorrhage on [place].  The bleeding has progressed from dark brown to now being more bright red and has increased in quantity. Pain has been improved while in the CDU but has not resolved to a manageable level  ROS:   Pt denies any:  Chest pain, dyspnea, palpitations  Cough or congestion   Pt does report:  Wt loss from 167 to 174 on different scales over the past couple of weeks  Decreased appetite with some nausea and vomiting 2o/2 her pain  Otherwise 12 point ROS performed and was otherwise negative    PMHx:  SS Sickle Cell Disease Trichotillomania Vaginal Bleeding Depression   PSHx: Cholestectomy  Social Hx: Single - lives with boyfriend; renting a room in a house together Former cigarette smoker; former Administrator, Civil Service smoker No EtOH  Poor social support; lived in home where father smoked crack cocaine; Pt reports that mom is not as supportive as she would wish for   Family Hx: SS trait   Allergies: Allergies  Allergen Reactions  . Toradol Itching    Some itching, give with benedryl    Home Medications:  Current Outpatient Prescriptions  Medication Sig Dispense Refill  .  folic acid (FOLVITE) 1 MG tablet Take 1 mg by mouth daily.       . prenatal vitamin w/FE, FA (PRENATAL 1 + 1) 27-1 MG TABS Take 1 tablet by mouth daily.        . promethazine (PHENERGAN) 25 MG tablet Take 1 tablet (25 mg total) by mouth every 6 (six) hours as needed for nausea.  30 tablet  1  . ranitidine (ZANTAC) 150 MG tablet Take 1 tablet (150 mg total) by mouth 2 (two) times daily.  60 tablet  1     OBJECTIVE: Vitals: Temp: 98.4oF HR: 97bpm  BP: 112/27mmHg RR: 22resp   O2: 98% on RA    PE: GENERAL:  Adult African-American female, examined in ED.  Alert and tearful.  In mild distress. In no respiratory distress. PSYCH: states that she is overwhelmed; needs support to help her make the right decisions.  Has been going to church more but doesn't feel well supported at this time.  Trying to make positive  HNEENT: PERRLA, extra ocular movement intact, sclera clear, anicteric and oropharynx clear, no lesions THORAX: HEART: S1, S2 normal, no murmur, rub or gallop, regular rate and rhythm LUNGS: clear to auscultation, no wheezes or rales and unlabored breathing ABDOMEN:  Positive BS, abdominal guarding is present not able to appreciate any masses; not focally tender to soft palpation EXTREMITIES: extremities normal, atraumatic, no cyanosis or edema >PELVIC: external genitalia normal; small amount of bright red blood per vaginal entroitus  Labs  CBC    Component Value Date/Time   WBC 15.5* 02/20/2011 0953   HGB 8.6* 02/20/2011 0953   HCT 24.2* 02/20/2011 0953   PLT 558* 02/20/2011 0953   Lab Results  Component Value Date   RETICCTPCT 16.4* 02/20/2011    BMET    Component Value Date/Time   NA 137 02/20/2011 1055   K 3.7 02/20/2011 1055   CL 105 02/20/2011 1055   CO2 23 02/20/2011 1055   GLUCOSE 99 02/20/2011 1055   BUN 4* 02/20/2011 1055   CREATININE <0.47* 02/20/2011 1055   CALCIUM 8.9 02/20/2011 1055   GFRNONAA NOT CALCULATED 02/20/2011 1055   GFRAA NOT CALCULATED 02/20/2011 1055      MICRO:  UA: negative for all fields; no micro performed.    Recent Results (from the past 240 hour(s))  CULTURE, OB URINE     Status: Normal (Preliminary result)   Collection Time   02/17/11 10:02 AM      Component Value Range Status Comment   Colony Count 65,000 COLONIES/ML   Preliminary    Preliminary Report STREPTOCOCCUS SPECIES   Preliminary   WET PREP, GENITAL     Status: Abnormal   Collection Time   02/20/11 10:53 AM      Component Value Range Status Comment   Yeast, Wet Prep NONE SEEN  NONE SEEN  Final    Trich, Wet Prep NONE SEEN  NONE SEEN  Final    Clue Cells, Wet Prep FEW (*) NONE SEEN  Final    WBC, Wet Prep HPF POC FEW (*) NONE SEEN  Final     Assessment & Plan: TAMEE BATTIN is a 20 y.o. year old female presenting with abdominal pain crisis and vaginal bleeding who is 9.[redacted] weeks pregnant.  She is being transferred to the observation unit (non-tele bed) by the [facility] Medicine Teaching Service.   1. Vasoocclusive Pain crisis with Sickle Cell SS Disease- typical pain for her although located in her abdomen.  Will provide analgesia while avoid NSAIDs and will try to minimize class C medication use.  Will repeat CBC and retic in AM; monitor for any respiratory or  sequestration crisis.  *Dilaudid 1mg  IV q 2o prn pain *Tylenol 650mg  po q 6o scheduled *Miralax 17g po q day  [ ]  CBC in PM and repeat in AM  [ ]  Reticulocyte count in AM  2. High Risk Pregnancy with bleeding - Pt recently seen for her high risk OB appointment.  Has had vaginal bleeding previously with subchorionic hemorrhage.  Was stable at last visit.  Will follow up with Dr. Edmonia James at [facility] @ 10/16 and with High Risk OB on Nov 1.  Pt not currently sx with Clue cells.  Will hold off on treatment at this time; Clean UA  - will repeat transvaginal US and transabdominal US  * prenatal multivitamin  * folate 1mg  po q day  3. PSYCH:  History of Trichotillomania; currently very anxious regarding pregnancy  and currently social support system.  - will consult SW and Chaplin services.    -- FEN/GI:   IVFs NS @ 136ml/hr   Diet: regular -- Prophylaxis:   *Lovenox 40Units sq q day  ======> Will place in observation to help control her pain.  Order abdominal and transvaginal US to evaluate status of the fetus.

## 2011-02-20 NOTE — H&P (Signed)
R2 Addendum of R1 Admission History and Physical.  Subjective:   Patient is a 20 y.o. female presents with sickle cell pain crisis in her abdomen.  Pt is pregnant, G1 @ 9 weeks and 5 days gestation based on 4 week ultradound. Onset of symptoms was gradual starting 2 days ago with rapidly worsening this morning. The pain is located in her abdomen, she describes it as a typical pain crisis.  However, she has had some increased . Patient describes the pain as sharp and 6/10 after a dose of dilaudid, was 9/10 when she arrived in the ED.  She has had some increased vaginal bleeding in the past 24 hours. Pain has been associated with nausea and vomiting.  She also reports poor PO intake in the past few days. Patient denies fever, cough congestion, chest pain. Symptoms are aggravated by movement. Symptoms improve with rest and dilaudid. Past history includes multiple admissions for pain crisis.  Previous studies include recent US showing in tact pregnancy with subchorionic hemorrhage.  Patient is tearful on exam today, saying she is very stressed out about her health and her pregnancy.   Patient Active Problem List  Diagnoses Date Noted  . Pregnancy, supervision of, high-risk 02/17/2011  . GERD (gastroesophageal reflux disease) 02/17/2011  . Constipation 02/17/2011  . Pregnancy as incidental finding 02/01/2011  . Depression 01/06/2011  . Vaginal bleeding 11/18/2010  . Back pain 09/17/2010  . Active smoker 08/09/2010  . Scar 08/09/2010  . OTHER ACUTE POSTOPERATIVE PAIN 05/20/2010  . UPPER RESPIRATORY INFECTION, ACUTE 05/18/2009  . DRY SKIN 05/18/2009  . SICKLE CELL ANEMIA 01/08/2009  . TRICHOTILLOMANIA 01/08/2009   Past Medical History  Diagnosis Date  . Sickle cell anemia with crisis   . Sickle cell anemia     Past Surgical History  Procedure Date  . Cholecystectomy   . Choley      (Not in a hospital admission) Allergies  Allergen Reactions  . Toradol Itching    Some itching, give with  benedryl    History  Substance Use Topics  . Smoking status: Former Smoker    Types: Cigars  . Smokeless tobacco: Never Used  . Alcohol Use: No    Family history: Sickle cell trait.  Review of Systems Pertinent items are noted in HPI.  Objective:   T 98.4   P 97  BP 112/68  RR 22  Pulse ox 98% RA General appearance: alert, cooperative and uncomfortable. Head: Normocephalic, without obvious abnormality, however patches of hair are missiong from trichotilomania. Eyes: PERRL, EOMIT, no icterus. Throat: lips, mucosa, and tongue normal; teeth and gums normal Back: symmetric, no curvature. ROM normal. No CVA tenderness. Lungs: clear to auscultation bilaterally Heart: regular rate and rhythm, S1, S2 normal, no murmur, click, rub or gallop Abdomen: + diffuse TTP, but no guarding or rebound, soft + BS. Extremities: extremities normal, atraumatic, no cyanosis or edema Neurologic: Grossly normal GU: small amount of bright red blood on maxipad.   Data Review CBC:  Lab Results  Component Value Date   WBC 15.5* 02/20/2011   HGB 8.6* 02/20/2011   HCT 24.2* 02/20/2011   MCV 87.4 02/20/2011   PLT 558* 02/20/2011   Lab Results  Component Value Date   RETICCTPCT 16.4* 02/20/2011     BMP:  Lab Results  Component Value Date   GLUCOSE 99 02/20/2011   CO2 23 02/20/2011   BUN 4* 02/20/2011   CREATININE <0.47* 02/20/2011   CALCIUM 8.9 02/20/2011   Urine dipstick shows  negative for all components.  Micro exam: not done.   Assessment:   20 year old female with PMHx of Sickle cell disease and currently pregnant presents with pain crisis:   Plan:   1) Heme- Hemoglobin near baseline, and retic count appropriately elevated. Will monitor but no acute need for transfusion at this time. 2) Neuro/Pain- will give IV dilaudid but will avoid PCA to minimize narcotic dosage since pt is pregnant.  Will also give benadryl as needed for itching 3) Reproductive- pt with increased bleeding in setting of  known subchorionic hemorrhage in first trimester pregnancy. Will obtain transvaginal and abdominal US to evaluate, concern for miscarriage in this high risk pregnancy.  4) Social- pt tearful upon exam today, stressed about pregnancy, would like Chaplain and Social work to see her for support.  5) FEN/GI- Will give NS @ 100 cc/hr, regular diet, Miralax for constipation, and protonix for GI PPX 6) DVT PPX- Lovenox 7- Dispo- pending clinical improvement.

## 2011-02-21 LAB — COMPREHENSIVE METABOLIC PANEL
ALT: 22 U/L (ref 0–35)
AST: 20 U/L (ref 0–37)
Albumin: 3.2 g/dL — ABNORMAL LOW (ref 3.5–5.2)
Alkaline Phosphatase: 45 U/L (ref 39–117)
CO2: 24 mEq/L (ref 19–32)
Chloride: 106 mEq/L (ref 96–112)
Creatinine, Ser: <0.47 mg/dL — ABNORMAL LOW (ref 0.50–1.10)
Potassium: 3.3 mEq/L — ABNORMAL LOW (ref 3.5–5.1)
Sodium: 138 mEq/L (ref 135–145)
Total Bilirubin: 2.1 mg/dL — ABNORMAL HIGH (ref 0.3–1.2)

## 2011-02-21 LAB — BASIC METABOLIC PANEL
BUN: 4 mg/dL — ABNORMAL LOW (ref 6–23)
Calcium: 8.7 mg/dL (ref 8.4–10.5)
Chloride: 105 mEq/L (ref 96–112)
Creatinine, Ser: <0.47 mg/dL — ABNORMAL LOW (ref 0.50–1.10)

## 2011-02-21 LAB — CULTURE, OB URINE: Colony Count: 65000

## 2011-02-21 LAB — CBC
HCT: 15.2 % — ABNORMAL LOW (ref 36.0–46.0)
HCT: 23 % — ABNORMAL LOW (ref 36.0–46.0)
Hemoglobin: 5.3 g/dL — CL (ref 12.0–15.0)
MCH: 29.1 pg (ref 26.0–34.0)
MCV: 86.4 fL (ref 78.0–100.0)
MCV: 82.7 fL (ref 78.0–100.0)
Platelets: 368 10*3/uL (ref 150–400)
RDW: 16.4 % — ABNORMAL HIGH (ref 11.5–15.5)
RDW: 16.4 % — ABNORMAL HIGH (ref 11.5–15.5)
WBC: 20.2 10*3/uL — ABNORMAL HIGH (ref 4.0–10.5)
WBC: 17.7 10*3/uL — ABNORMAL HIGH (ref 4.0–10.5)

## 2011-02-21 LAB — RETICULOCYTES
RBC.: 1.76 MIL/uL — ABNORMAL LOW (ref 3.87–5.11)
Retic Count, Absolute: 346.7 10*3/uL — ABNORMAL HIGH (ref 19.0–186.0)

## 2011-02-21 LAB — DIFFERENTIAL
Basophils Relative: 0 % (ref 0–1)
Eosinophils Absolute: 0.4 10*3/uL (ref 0.0–0.7)
Eosinophils Relative: 2 % (ref 0–5)
Lymphs Abs: 3.2 10*3/uL (ref 0.7–4.0)
Monocytes Absolute: 1.9 10*3/uL — ABNORMAL HIGH (ref 0.1–1.0)
Neutro Abs: 12.2 10*3/uL — ABNORMAL HIGH (ref 1.7–7.7)
Neutrophils Relative %: 69 % (ref 43–77)

## 2011-02-21 LAB — GC/CHLAMYDIA PROBE AMP, GENITAL: GC Probe Amp, Genital: NEGATIVE

## 2011-02-22 LAB — CBC
Hemoglobin: 7.9 g/dL — ABNORMAL LOW (ref 12.0–15.0)
MCH: 28.9 pg (ref 26.0–34.0)
MCH: 29.3 pg (ref 26.0–34.0)
MCHC: 34.1 g/dL (ref 30.0–36.0)
MCHC: 34.5 g/dL (ref 30.0–36.0)
MCV: 85 fL (ref 78.0–100.0)
MCV: 85 fL (ref 78.0–100.0)
Platelets: 391 10*3/uL (ref 150–400)
RBC: 2.73 MIL/uL — ABNORMAL LOW (ref 3.87–5.11)
RDW: 18.1 % — ABNORMAL HIGH (ref 11.5–15.5)

## 2011-02-22 LAB — BASIC METABOLIC PANEL
CO2: 25 mEq/L (ref 19–32)
Calcium: 8.5 mg/dL (ref 8.4–10.5)
Creatinine, Ser: <0.47 mg/dL — ABNORMAL LOW (ref 0.50–1.10)
Glucose, Bld: 96 mg/dL (ref 70–99)

## 2011-02-23 LAB — CROSSMATCH
DAT, IgG: NEGATIVE
Unit division: 0
Unit division: 0

## 2011-03-01 ENCOUNTER — Encounter: Payer: Self-pay | Admitting: Family Medicine

## 2011-03-01 ENCOUNTER — Ambulatory Visit (INDEPENDENT_AMBULATORY_CARE_PROVIDER_SITE_OTHER): Payer: Medicaid Other | Admitting: Family Medicine

## 2011-03-01 DIAGNOSIS — F329 Major depressive disorder, single episode, unspecified: Secondary | ICD-10-CM

## 2011-03-01 DIAGNOSIS — M549 Dorsalgia, unspecified: Secondary | ICD-10-CM

## 2011-03-01 DIAGNOSIS — D571 Sickle-cell disease without crisis: Secondary | ICD-10-CM

## 2011-03-01 DIAGNOSIS — Z309 Encounter for contraceptive management, unspecified: Secondary | ICD-10-CM

## 2011-03-01 DIAGNOSIS — F32A Depression, unspecified: Secondary | ICD-10-CM

## 2011-03-01 MED ORDER — CEPHALEXIN 500 MG PO CAPS: 500.0000 mg | ORAL_CAPSULE | Freq: Three times a day (TID) | ORAL | Status: AC

## 2011-03-01 NOTE — Assessment & Plan Note (Signed)
Patient has back pain, musculoskeletal in nature. No red flags on exam. Patient to do home PT. Return in 2 weeks if no improvement.

## 2011-03-01 NOTE — Patient Instructions (Signed)
Make an appointment to see a therapist. Dr. Pascal Lux - see card [facility]- [phone]  Do back exercises 2 x per day.  Make an appointment for implanon insertion.  My office will call you with the cost.  If to expensive, make an appointment with the nurse for the depo injection.  Call heme/onc MD to make an appointment asap- to discuss restart of hydroxyurea.   Back Exercises   These exercises may help you when beginning to rehabilitate your injury. Your symptoms may resolve with or without further involvement from your physician, physical therapist or athletic trainer. While completing these exercises, remember:   Restoring tissue flexibility helps normal motion to return to the joints. This allows healthier, less painful movement and activity.  An effective stretch should be held for at least 30 seconds.  A stretch should never be painful. You should only feel a gentle lengthening or release in the stretched tissue.   STRETCH - Extension, Prone on Elbows   Lie on your stomach on the floor, a bed will be too soft. Place your palms about shoulder width apart and at the height of your head.  Place your elbows under your shoulders. If this is too painful, stack pillows under your chest.  Allow your body to relax so that your hips drop lower and make contact more completely with the floor.  Hold this position for __________ seconds.  Slowly return to lying flat on the floor. Repeat __________ times. Complete this exercise __________ times per day.      RANGE OF MOTION - Extension, Prone Press Ups   Lie on your stomach on the floor, a bed will be too soft. Place your palms about shoulder width apart and at the height of your head.  Keeping your back as relaxed as possible, slowly straighten your elbows while keeping your hips on the floor. You may adjust the placement of your hands to maximize your comfort. As you gain motion, your hands will come more underneath your  shoulders.  Hold this position __________ seconds.  Slowly return to lying flat on the floor. Repeat __________ times. Complete this exercise __________ times per day.        RANGE OF MOTION- Quadruped, Neutral Spine   Assume a hands and knees position on a firm surface. Keep your hands under your shoulders and your knees under your hips. You may place padding under your knees for comfort.    Drop your head and point your tail bone toward the ground below you.  This will round out your low back like an angry cat. Hold this position for __________ seconds.   Slowly lift your head and release your tail bone so that your back sags into a large arch, like an old horse.  Hold this position for __________ seconds.   Repeat this until you feel limber in your low back.  Now, find your "sweet spot." This will be the most comfortable position somewhere between the two previous positions. This is your neutral spine. Once you have found this position, tense your stomach muscles to support your low back.  Hold this position for __________ seconds. Repeat __________ times.  Complete this exercise __________ times per day.      STRETCH - Flexion, Single Knee to Chest   Lie on a firm bed or floor with both legs extended in front of you.  Keeping one leg in contact with the floor, bring your opposite knee to your chest. Hold your leg  in place by either grabbing behind your thigh or at your knee.  Pull until you feel a gentle stretch in your low back. Hold __________ seconds.  Slowly release your grasp and repeat the exercise with the opposite side. Repeat __________ times. Complete this exercise __________ times per day.     STRETCH - Hamstrings, Standing  Stand or sit and extend your __________ leg, placing your foot on a chair or foot stool  Keeping a slight arch in your low back and your hips straight forward.    Lead with your chest and lean forward at the waist until you feel a  gentle stretch in the back of your __________ knee or thigh. (When done correctly, this exercise requires leaning only a small distance.)  Hold this position for __________ seconds. Repeat __________ times. Complete this stretch __________ times per day.     STRENGTHENING - Deep Abdominals, Pelvic Tilt   Lie on a firm bed or floor. Keeping your legs in front of you, bend your knees so they are both pointed toward the ceiling and your feet are flat on the floor.  Tense your lower abdominal muscles to press your low back into the floor. This motion will rotate your pelvis so that your tail bone is scooping upwards rather than pointing at your feet or into the floor.  With a gentle tension and even breathing, hold this position for __________ seconds. Repeat __________ times. Complete this exercise __________ times per day.     STRENGTHENING - Abdominals, Crunches   Lie on a firm bed or floor. Keeping your legs in front of you, bend your knees so they are both pointed toward the ceiling and your feet are flat on the floor. Cross your arms over your chest.    Slightly tip your chin down without bending your neck.  Tense your abdominals and slowly lift your trunk high enough to just clear your shoulder blades. Lifting higher can put excessive stress on the low back and does not further strengthen your abdominal muscles.  Control your return to the starting position. Repeat __________ times. Complete this exercise __________ times per day.      STRENGTHENING - Quadruped, Opposite UE/LE Lift   Assume a hands and knees position on a firm surface. Keep your hands under your shoulders and your knees under your hips.  You may place padding under your knees for comfort.    Find your neutral spine and gently tense your abdominal muscles so that you can maintain this position. Your shoulders and hips should form a rectangle that is parallel with the floor and is not twisted.   Keeping your trunk  steady, lift your right hand no higher than your shoulder and then your left leg no higher than your hip. Make sure you are not holding your breath. Hold this position __________ seconds.  Continuing to keep your abdominal muscles tense and your back steady, slowly return to your starting position. Repeat with the opposite arm and leg. Repeat __________ times. Complete this exercise __________ times per day.   Document Released: 05/20/2005  Document Re-Released: 04/20/2009 [facility] Patient Information 2011 [place], [place].

## 2011-03-01 NOTE — Progress Notes (Signed)
  Subjective:    Patient ID: [name], female    DOB: November 27, 1990, 20 y.o.   MRN: [id number]  HPI Miscarriage: Patient had miscarriage during recent hospitalization 1-2 weeks ago. No further vaginal bleeding. No abdominal pain. Patient states she is doing well. PH Q.-9 score of 6. Patient states that she is open to seeing a therapist to discuss her feelings about this miscarriage.  Sickle cell: Has had multiple hospitalizations during the past few months for sickle cell crises. Patient on hydroxyurea due to pregnancy. Patient desires to go back on hydroxyurea for better pain crisis control. Patient does not have an appointment scheduled with him on Dr.- last appointment was canceled due to her being in the hospital  Birth control: Patient states that she does not want a pregnancy at this time. Patient states that she is interested in Implanon or the Depo-Provera shot.  Back pain: Pain present in right scapular area, and across lower back. Seems he worsened she has been laying down and not active. No weakness. No fever. No problems with bowel or bladder.  Review of Systems    as per above. Objective:   Physical Exam  Constitutional: She is oriented to person, place, and time. She appears well-developed and well-nourished.  HENT:  Head: Normocephalic and atraumatic.  Cardiovascular: Normal rate.   No murmur heard. Pulmonary/Chest: Effort normal. No respiratory distress.  Abdominal: Soft. She exhibits no distension. There is no tenderness. There is no rebound.  Musculoskeletal: She exhibits no edema.        Back exam : Minimal tenderness with palpation over must which are of right upper scapula and lower back paraspinal muscles. Good strength bilateral. Sensation intact bilateral. Reflexes equal bilateral.  Neurological: She is alert and oriented to person, place, and time.  Psychiatric: She has a normal mood and affect. Her behavior is normal.    phq-9 score of 6        Assessment & Plan:

## 2011-03-01 NOTE — Assessment & Plan Note (Signed)
Patient to call reschedule appointment with him on a physician.-Needs discuss restart of hydroxyurea

## 2011-03-01 NOTE — Assessment & Plan Note (Signed)
02/25/11- phq-9 score of 6. S/p miscarriage. Patient encouraged to see therapist. Recommendations/telephone numbers given.

## 2011-03-01 NOTE — Assessment & Plan Note (Signed)
Patient desires contraception-most interested in Implanon, also Depo-Provera. Office staff to call patient and give estimate of out of pocket cost for these forms. Patient to make followup appointment as soon as possible. Will use condoms until birth control in place.

## 2011-03-01 NOTE — Discharge Summary (Signed)
  [name], BELLIN NO.:  [id number]  MEDICAL RECORD NO.:  [id number]  LOCATION:  5121                         FACILITY:  [facility]  PHYSICIAN:  [name], M.D.DATE OF BIRTH:  1990-09-02  DATE OF ADMISSION:  02/20/2011 DATE OF DISCHARGE:  02/22/2011                              DISCHARGE SUMMARY   DISCHARGE DIAGNOSES: 1. Sickle cell disease with vaso-occlusive crisis. 2. Completed spontaneous abortion.  DISCHARGE MEDICATIONS: 1. Acetaminophen 650 mg q.8 h. 2. Oxycodone 5 mg q.4 h. p.r.n. pain. 3. Folic acid 4 mg daily. 4. Prenatal vitamin 1 tablet daily. 5. Zofran 4 mg p.o. q.6 h. p.r.n. nausea.  CONSULTS:  None.  LABORATORY DATA:  Hemoglobin 8.6 on admission, 5.3 after miscarriage and 8 posttransfusion.  White blood count at baseline 15.5.  PERTINENT STUDIES:  Ultrasound findings compatible with spontaneous abortion, and right ovarian cyst.  BRIEF HOSPITAL COURSE:  A 20 year old female African American with a history of sickle cell disease, admitted for pain crisis as abdominal pain, and pregnancy that during hospitalization presented with vaginal bleeding, resulting in complete spontaneous abortion. 1. Sickle cell pain crisis.  The patient presented at this time as     abdominal pain that was initially controlled with IV fluids,     Dilaudid IV p.r.n., and then transitioned to oxycodone 5 mg oral. 2. Completed spontaneous abortion.  On Sunday, she started to have     vaginal bleeding and "passed down clots."  Ultrasound was     performed, positive for complete spontaneous abortion.  The patient     was offered [employer]. 3. Anemia.  Hemoglobin on admission 8.6, after miscarriage was as low     as 5.3.  The patient was transfused 2 units and hemoglobin came     back stable around 8. 4. Hypokalemia 1 episode with potassium 3.4 that resolved with oral     KCl 40 mcg x1.  The patient was stable at discharge and has an appointment with  Dr. Edmonia James, her PCP.  FOLLOWUP RECOMMENDATIONS:  Recheck hemoglobin, evaluate for signs of depression.  DISCHARGE INSTRUCTIONS:  Return to daily activities, regular diet. Appointment on March 01, 2011, at 11 a.m. and the patient is discharged home on stable medical condition.    ______________________________ [name], MD   ______________________________ [name], M.D.    DP/MEDQ  D:  02/23/2011  T:  02/23/2011  Job:  [id number]  Electronically Signed by Delorse Lek PAZ  on 02/28/2011 09:02:07 PM Electronically Signed by [name] M.D. on 03/01/2011 02:29:53 PM

## 2011-03-01 NOTE — H&P (Signed)
NAME:  Nancy, [name] NO.:  [id number]  MEDICAL RECORD NO.:  [id number]  LOCATION:  5121                         FACILITY:  [facility]  PHYSICIAN:  [name], M.D.DATE OF BIRTH:  [birth year]  DATE OF ADMISSION:  02/20/2011 DATE OF DISCHARGE:                             HISTORY & PHYSICAL   PRIMARY CARE PROVIDER:  [name], MD  CHIEF COMPLAINT:  Abdominal pain, sickle cell pain crisis.  HISTORY OF PRESENT ILLNESS:  [name] is a 20 year old female presenting with 2-day history of vague abdominal pain, it has progressed into a typical sickle cell pain crisis located in her abdomen.  Of significant, she is pregnant and 9 weeks' 5 days.  She does have some vaginal bleeding and complains that some of her abdominal pain is crampy in nature.  A pelvic exam in the ED revealed a closed cervical os with blood in the vaginal vault.  She has been previously found to have a stable subchorionic hemorrhage on ultrasound.  The bleeding is progressed from dark brown to now being more of bright red and has increased in quantity.  Pain has been improved while on the CDU, but is not resolved to manageable level.  REVIEW OF SYSTEMS:  The patient denies chest pain, dyspnea, palpations, cough, or congestion.  She does report weight loss from 174 to 167 pounds, however this was on different scales over the past couple weeks. She also has decreased appetite with some nausea and vomiting secondary to her pain.  Otherwise, 12-point review of systems was performed and was otherwise negative.  PAST MEDICAL HISTORY:  She has a history of sickle cell SS disease, trichotillomania, vaginal bleeding, and depression.  PAST SURGICAL HISTORY:  Cholecystectomy.  SOCIAL HISTORY:  She is single.  She lives with her boyfriend.  They are renting a room in a house together.  She is a former cigarette smoker, former marijuana smoker.  There is no ethyl alcohol.  She does report poor  social support.  She lives in a home with father smoke crack cocaine and she reports that her mom is not supportive that she would like for her to be.  FAMILY HISTORY:  Positive for Sickle Cell trait.  ALLERGIES:  She is allergic to TORADOL.  HOME MEDICATIONS: 1. She is on folic acid 1 mg p.o. daily. 2. Prenatal vitamin 1 tablet p.o. daily. 3. Phenergan 25 mg p.o. q.6 hours as needed for pain. 4. Zantac 150 mg tablet p.o. b.i.d. 5. Oxycodone 5/325 as prescribed by her OB for her pain crisis.  OBJECTIVE:  VITAL SIGNS:  Temperature of 98.4 degrees Fahrenheit, heart rate of 97 beats per minute, blood pressure of 112/68, respiratory rate of 22, oxygen saturation 98% on room air. GENERAL:  This is an adult [place] female examined in the emergency department.  She is alert and cheerful.  She is in mild distress and no respiratory distress. PSYCHIATRIC:  She states that she is overwhelmed, needs support to help her make right decisions.  She has been going to church more often, but does not feel well supported at this time.  She is trying to make positive influences on changes in her lift for her baby.  HEAD, NECK EYES, EARS, NOSE, AND THROAT:  Pupils are equally round and reactive to light and accommodation.  Extraocular muscles are intact.  Sclerae are clear, anicteric and oropharynx is clear with no lesions. HEART:  S1, S2 is normal.  No murmur, rub, or gallop.  Regular rate and rhythm. LUNGS:  Clear to auscultation.  No wheezes or rales and unlabored breathing. ABDOMEN:  Positive bowel sounds.  Abdominal guarding is present, however we cannot appreciate any masses due to tenderness on exam.  She is not focally tender to soft palpation, but is diffusely tender to any form of deep palpation. EXTREMITIES:  Normal, atraumatic.  No cyanosis, no edema. PELVIC:  External genitalia is normal.  There was a small amount of bright red blood at the vaginal introitus.  LABORATORY  DATA:  White blood cells of 15.5, hemoglobin of 8.6, hematocrit of 24.4, platelets of 558.  She has a reticulocyte count of percentage of 16.4.  BMET unremarkable.  Micro urinalysis was negative for all fields.  OB culture did show Streptococcus species at 65,000 colonies per mL, and this was obtained on February 17, 2011.  Wet prep that was obtained today in the emergency department did show clue cells and white blood cells, no yeast, no Trichomonas.  ASSESSMENT AND PLAN:  [name] is a 20 year old female presenting with abdominal pain crisis and vaginal bleeding who is 9 weeks' 5 days' pregnant.  She has been transferred to the observation unit on the non- telemetry bed by the [facility] Medicine Teaching Service. 1. Vaso-oclusive pain crisis with sickle cell SS disease.  This is     typical pain for her although the abdominal location dose      raise concerns for a potential pregnancy compllocation.  We will     provide analgesia while avoiding NSAIDs and we will try to minimize     class C medication use.  We will repeat CBC and recheck in a.m.,     monitor for any respiratory or sequestration crisis.  She will be     provided Dilaudid 1 mg IV q.2 p.r.n. for pain, Tylenol 650 mg p.o.     q.6 hours as scheduled.  We will give her bowel regimen with     MiraLax 17 g p.o. daily.  We will recheck a CBC in the p.m. and     repeat in the a.m. along with a repeat reticulocyte count in the     a.m. 2. High risk pregnancy with bleeding.  The patient recently seen for     her high risk OB appointment.  She has had vaginal bleeding     previously with subchorionic hemorrhage.  She was stable at the     last visit.  We will follow up with Dr. Edmonia James at [facility] on March 01, 2011, and with high risk OB     on March 17, 2011.  The patient is not currently symptomatic with     although she is positive for clue cells.       We will hold off on treatment at this time.   Clean UA.     We will repeat transvaginal ultrasound and transabdominal     ultrasound.  She is to continue her prenatal multivitamin and     folate. 3. Psych.  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  She is     currently very anxious regarding the pregnancy and currently her  current social support system.  We have can consulted Social Work     and [employer].  FEN/GI: IV fluids will be started, normal saline at 100 mL per hour.  Diet is     regular.  Prophylaxis.  We will give her Lovenox 40 units subcu daily.  Disposition.  We will place her on observation to help control her     pain.  Order abdominal and transvaginal ultrasound to evaluate the     status of fetus as the increased vaginal bleeding is concerning for     impending miscarriage, although the cervical os was found to be     close on pelvic exam in the ED.  ADDENDUM:  Noted after the time, this history and physical was obtained and prior to being moved to the floor, the patient did pass two large clots with particles of conception while in the ED.  It was apparent that she has miscarried while in the ED.  We will obtain a transvaginal and abdominal ultrasound to evaluate for any retained products at this time.  And repeat hemoglobin tonight and first thing tomorrow morning and with a low threshold to transfuse if she has continued bleeding.    ______________________________ Gaspar Bidding, DO   ______________________________ [name], M.D.    MR/MEDQ  D:  02/20/2011  T:  02/21/2011  Job:  [id number]  Electronically Signed by Gaspar Bidding  on 03/01/2011 02:11:14 PM Electronically Signed by [name] M.D. on 03/01/2011 02:29:56 PM

## 2011-03-01 NOTE — Progress Notes (Signed)
Addended by: Catalina Antigua on: 03/01/2011 04:44 PM   Modules accepted: Orders

## 2011-03-02 ENCOUNTER — Telehealth: Payer: Self-pay | Admitting: *Deleted

## 2011-03-02 NOTE — Telephone Encounter (Signed)
I called pt and in formed her of + UTI. Her medication prescription has been sent to her pharmacy. Pt voiced understanding.

## 2011-03-02 NOTE — Telephone Encounter (Signed)
Message copied by Jill Side on Wed Mar 02, 2011 11:24 AM ------      Message from: Catalina Antigua      Created: Tue Mar 01, 2011  4:44 PM      Regarding: UTI       Please inform patient of positive urine culture and Rx for keflex sent to pharmacy.            Thank you            Peggy

## 2011-03-04 NOTE — H&P (Signed)
NAME:  [name], HOUSEWRIGHT NO.:  [id number]  MEDICAL RECORD NO.:  [id number]  LOCATION:                                 FACILITY:  PHYSICIAN:  Paula Compton, MD        DATE OF BIRTH:  Oct 15, 1990  DATE OF ADMISSION:  02/14/2011 DATE OF DISCHARGE:                             HISTORY & PHYSICAL   PRIMARY CARE PROVIDER:  [name], MD at [facility].  CHIEF COMPLAINT:  Sickle cell pain crisis.  HISTORY OF PRESENT ILLNESS:  The patient is a 20 year old female presenting with pain crisis mostly in her right leg.  The patient presented to [facility] yesterday evening with vaginal bleeding and headache.  She was given Percocet for the headache, which helped her to sleep.  When she woke up, her headache had improved, but she felt instant pain in her right leg, which appeared to be significant for sickle cell pain crisis.  The patient has no other complaints or locations of pain at this time.  She states her abdomen hurt earlier, but that has since resolved.  She also has back pain at baseline, but she reports that is not a pain crisis type of pain.  She feels that this crisis is brought on from working hard today and not eating or drinking well.  She was given Dilaudid at [facility] and sent to [facility] for admission to the [facility] for observation overnight and IV pain medications.  PAST MEDICAL HISTORY: 1. Sickle cell anemia. 2. Pregnancy.  PAST SURGICAL HISTORY:  Cholecystectomy.  SOCIAL HISTORY:  The patient is a former smoker.  She states she no longer smokes since she is pregnant.  She does not drink alcohol or use illicit drugs.  She lives at home with her boyfriend and works at [employer].  FAMILY HISTORY:  Some family members also carry sickle cell trait.  No one else in her family has sickle cell disease.  ALLERGIES:  TORADOL.  HOME MEDICATIONS:  Folic acid 4 mg p.o. daily, prenatal vitamins  p.o. daily.  The patient also had prescription for oxycodone from last hospital admission 5 mg as needed.  REVIEW OF SYSTEMS:  Per HPI with the following additions; no chest pain, shortness of breath, or rash.  Otherwise 12-point review of systems performed and unremarkable.  PHYSICAL EXAMINATION:  VITAL SIGNS:  Temperature 97.8, pulse 87, blood pressure 98/64, respirations 18, O2 sats 100% on room air. GENERAL:  The patient is alert, cooperative, and in mild distress. HEENT:  Pupils are equal, round, and reactive to light.  Oropharynx clear with no lesions. HEART:  S1 and S2 normal.  No murmurs, rubs, or gallops.  Regular rate and rhythm. LUNGS:  Clear to auscultation.  No wheezes or rales. ABDOMEN:  Soft without tenderness. EXTREMITIES:  The patient moves all extremities, but is guarded with the right leg.  She has no edema bilaterally.  Right knee is tender to palpation from midline just below the joint line.  No more swelling or erythema.  Otherwise, no pain in other joints or long bones. SKIN:  No rashes, no wounds. NEUROLOGIC:  Nonfocal.  Mental status is within  normal limits.  The patient is alert and oriented x3.  PERTINENT LABS:  CBC show white blood cell count of 18.5, hemoglobin 8.2, hematocrit 24.0, platelets 529, retic 13.2.  Transvaginal ultrasound on February 13, 2011, showed a single intrauterine pregnancy with measured crown length of 4 cm gestational age of [redacted] weeks and 4 days reporting a delivery date of Sep 21, 2011.  The patient also has a small subchorionic hemorrhage.  ASSESSMENT/PLAN:  [name] is a 20 year old female who presented with sickle cell pain crisis of the right knee. 1. Vaso-occlusive pain crisis.  No other concerning symptoms.  We will     treat with Dilaudid IV 2 mg every 2 hours as needed as well as     scheduled Tylenol every 8 hours for pain.  The patient did not want     PCA, therefore she will ask her nurse for Dilaudid as needed.   We     will treat itching with Benadryl 25 mg IV every 4 hours.  She has a     white blood cell count of 18.5, which is higher than her baseline.     She is afebrile right now.  Her knee continues to bother her and we     will consider an x-ray tomorrow. 2. Sickle cell disease.  The patient's hemoglobin was 8.1 on     admission.  Her last transfusion was 2 weeks ago.  This is her     baseline and there is no need for transfusion at this time.  We     will recheck her CBC in the morning. 3. High-risk pregnancy.  The patient presented to [facility] for     vaginal bleeding.  She had a vaginal ultrasound, which showed a     viable 8-week 4 day pregnancy.  We will continue prenatal vitamins     and high-dose folic acid.  We will avoid NSAIDs for treatment of     her pain as well as other medications that are not safe during     pregnancy.  She is scheduled for an OB appointment with Dr.     Edmonia Lalaine Overstreet today at [facility] to establish her care.  She has     an appointment at [facility] on February 17, 2011.  She also     reports to her nurse that she thought she had a yeast infection.     She is in pain at this time and unable to bend her leg.  We will     get her pain under control for yeast infection and treat     appropriately. 4. Fluid, electrolytes, and nutrition/gastrointestinal.  Regular diet     and MiraLax as needed for constipation. 5. Prophylaxis.  Heparin 5000 units subcu t.i.d., Protonix 40 mg. 6. Disposition.  Discharge pending clinical improved obtained.    ______________________________ Rodman Pickle, MD   ______________________________ Paula Compton, MD    AH/MEDQ  D:  02/14/2011  T:  02/14/2011  Job:  [id number]  Electronically Signed by Rodman Pickle  on 02/22/2011 09:15:38 AM Electronically Signed by Paula Compton MD on 03/04/2011 02:29:13 PM

## 2011-03-09 NOTE — Discharge Summary (Signed)
NAME:  [name], [name]             ACCOUNT NO.:  [id number]  MEDICAL RECORD NO.:  [id number]  LOCATION:                                 FACILITY:  PHYSICIAN:  Leighton Roach Creedence Kunesh, M.D.DATE OF BIRTH:  [birth year]  DATE OF ADMISSION:  02/14/2011 DATE OF DISCHARGE:  02/16/2011                              DISCHARGE SUMMARY   DISCHARGE DIAGNOSES: 1. Sickle cell disease with vaso-occlusive crisis. 2. Nausea and vomiting secondary to pregnancy.  DISCHARGE MEDICATIONS: 1. Acetaminophen 650 mg p.o. q.8. 2. Oxycodone 5 mg IR, take 5 mg p.o. q.6 hours p.r.n. pain. 3. Folic acid 1 mg 4 tablets daily. 4. OTC vaginal yeast cream 1 application vaginally a day as needed. 5. Prenatal vitamin 1 tab p.o. daily. 6. Zofran 4 mg 1 tablet p.o. q.6 hours p.r.n. for nausea.  CONSULTS:  None.  LABORATORY DATA:  Hemoglobin 8.2, white blood count 18.5 on admission and on discharge 16.9.  Reticulocytes  13.2.  Urinalysis and micro negative.  PERTINENT STUDIES:  Ultrasound performed on October 2 with fetal heart rate of 173 beats per minute.  Stable small subchorionic hemorrhage.  BRIEF HOSPITAL COURSE:  This is a 20 year old female African American with a history of sickle cell disease who present with 8.[redacted] weeks gestation   admitted for vaso-occlusive crisis on her right knee. 1. Pain crisis, mostly on her right knee.  No edema.  No erythema.  No     signs of septic joint.  Full range of motion  mildly limited due     to pain.  The patient decreased weightbearing on that leg.     She was initially on Dilaudid, seemed somnolent after pain     medication, rapid response was activated and we also were called to     assess her. The patient's vital signs during the episode were completely     within normal limits.     The Dilaudid was discontinued and started on oxycodone 5 mg p.r.n.     since the patient stated at home the 15 mg was too much for her and she was     not taken because she only had tablets  of 15 mg each. 2. Vaginal bleeding, before she was admitted to our service for pain     crisis, she went to [facility] due to vaginal bleeding.  An     ultrasound performed showed small subchorionic hemorrhage and fetus     with cardiac activity present.  On the 2nd day of hospital stay,     she had very lightly vaginal bleeding, she was examined and OS was     closed and there was some blood in the posterior vault.  Ultrasound     was performed and was reassurance of fetal viability and stable     subchorionic hemorrhage. 3. Nausea and vomiting due to pregnancy, alleviated with Zofran and IV     fluid.  The patient was tolerating food on the discharge date.  DISCHARGE INSTRUCTIONS:  Return to daily activity.  Regular diet. [facility]ment [facility] October 4 at 8:30 a.m. and the patient is discharged home in stable medical condition.    ______________________________  Dayarmys Piloto Rolene Arbour, MD   ______________________________ Leighton Roach Danis Pembleton, M.D.    DP/MEDQ  D:  02/23/2011  T:  02/23/2011  Job:  [id number]  Electronically Signed by Delorse Lek PAZ  on 02/28/2011 08:58:47 PM Electronically Signed by Acquanetta Belling M.D. on 03/09/2011 08:02:04 AM

## 2011-03-15 ENCOUNTER — Emergency Department (HOSPITAL_BASED_OUTPATIENT_CLINIC_OR_DEPARTMENT_OTHER)
Admission: EM | Admit: 2011-03-15 | Discharge: 2011-03-15 | Disposition: A | Discharge status: Home / Self Care | Payer: Medicaid Other | Attending: Emergency Medicine | Admitting: Emergency Medicine

## 2011-03-15 ENCOUNTER — Emergency Department (INDEPENDENT_AMBULATORY_CARE_PROVIDER_SITE_OTHER): Payer: Medicaid Other

## 2011-03-15 ENCOUNTER — Encounter (HOSPITAL_BASED_OUTPATIENT_CLINIC_OR_DEPARTMENT_OTHER): Payer: Self-pay | Admitting: *Deleted

## 2011-03-15 DIAGNOSIS — D571 Sickle-cell disease without crisis: Secondary | ICD-10-CM

## 2011-03-15 DIAGNOSIS — Z79899 Other long term (current) drug therapy: Secondary | ICD-10-CM | POA: Insufficient documentation

## 2011-03-15 DIAGNOSIS — M79609 Pain in unspecified limb: Secondary | ICD-10-CM

## 2011-03-15 DIAGNOSIS — D5700 Hb-SS disease with crisis, unspecified: Secondary | ICD-10-CM | POA: Insufficient documentation

## 2011-03-15 DIAGNOSIS — IMO0001 Reserved for inherently not codable concepts without codable children: Secondary | ICD-10-CM | POA: Insufficient documentation

## 2011-03-15 LAB — URINALYSIS, ROUTINE W REFLEX MICROSCOPIC
Glucose, UA: NEGATIVE mg/dL
Ketones, ur: NEGATIVE mg/dL
Leukocytes, UA: NEGATIVE
Protein, ur: NEGATIVE mg/dL

## 2011-03-15 LAB — CBC
MCHC: 34.8 g/dL (ref 30.0–36.0)
Platelets: 564 10*3/uL — ABNORMAL HIGH (ref 150–400)
RDW: 15.3 % (ref 11.5–15.5)
WBC: 15.2 10*3/uL — ABNORMAL HIGH (ref 4.0–10.5)

## 2011-03-15 LAB — RETICULOCYTES
RBC.: 3.3 MIL/uL — ABNORMAL LOW (ref 3.87–5.11)
Retic Count, Absolute: 458.7 10*3/uL — ABNORMAL HIGH (ref 19.0–186.0)
Retic Ct Pct: 13.9 % — ABNORMAL HIGH (ref 0.4–3.1)

## 2011-03-15 LAB — BASIC METABOLIC PANEL
CO2: 23 mEq/L (ref 19–32)
Calcium: 9.4 mg/dL (ref 8.4–10.5)
Creatinine, Ser: 0.6 mg/dL (ref 0.50–1.10)
GFR calc non Af Amer: >90 mL/min (ref >90)

## 2011-03-15 LAB — DIFFERENTIAL
Basophils Absolute: 0.1 10*3/uL (ref 0.0–0.1)
Basophils Relative: 1 % (ref 0–1)
Monocytes Absolute: 1.8 10*3/uL — ABNORMAL HIGH (ref 0.1–1.0)
Neutro Abs: 8.8 10*3/uL — ABNORMAL HIGH (ref 1.7–7.7)
Neutrophils Relative %: 58 % (ref 43–77)

## 2011-03-15 MED ORDER — HYDROMORPHONE HCL 2 MG/ML IJ SOLN
2.0000 mg | Freq: Once | INTRAMUSCULAR | Status: DC
  Filled 2011-03-15 (×2): qty 1

## 2011-03-15 MED ORDER — NAPROXEN 500 MG PO TABS: 500.0000 mg | ORAL_TABLET | Freq: Two times a day (BID) | ORAL | Status: DC

## 2011-03-15 MED ORDER — HYDROMORPHONE HCL 1 MG/ML IJ SOLN
INTRAMUSCULAR | Status: AC
  Administered 2011-03-15: 1 mg
  Filled 2011-03-15: qty 1

## 2011-03-15 MED ORDER — SODIUM CHLORIDE 0.9 % IV BOLUS (SEPSIS)
1000.0000 mL | Freq: Once | INTRAVENOUS | Status: AC
  Administered 2011-03-15: 1000 mL via INTRAVENOUS

## 2011-03-15 MED ORDER — ONDANSETRON HCL 4 MG/2ML IJ SOLN
4.0000 mg | Freq: Once | INTRAMUSCULAR | Status: AC
  Administered 2011-03-15: 4 mg via INTRAVENOUS
  Filled 2011-03-15: qty 2

## 2011-03-15 MED ORDER — HYDROMORPHONE HCL 2 MG/ML IJ SOLN
2.0000 mg | Freq: Once | INTRAMUSCULAR | Status: AC
  Administered 2011-03-15: 2 mg via INTRAVENOUS
  Filled 2011-03-15: qty 1

## 2011-03-15 MED ORDER — HYDROMORPHONE HCL 1 MG/ML IJ SOLN
1.0000 mg | Freq: Once | INTRAMUSCULAR | Status: AC
  Administered 2011-03-15: 1 mg via INTRAVENOUS
  Filled 2011-03-15: qty 1

## 2011-03-15 MED ORDER — DIPHENHYDRAMINE HCL 25 MG PO CAPS
25.0000 mg | ORAL_CAPSULE | Freq: Once | ORAL | Status: AC
  Administered 2011-03-15: 25 mg via ORAL
  Filled 2011-03-15 (×2): qty 1

## 2011-03-15 MED ORDER — OXYCODONE-ACETAMINOPHEN 5-325 MG PO TABS: 2.0000 | ORAL_TABLET | ORAL | Status: DC | PRN

## 2011-03-15 NOTE — ED Notes (Signed)
Family at bedside. 

## 2011-03-15 NOTE — ED Notes (Signed)
Dr. Brooke Dare is aware of pt. labs

## 2011-03-15 NOTE — ED Provider Notes (Signed)
History     CSN: [id number] Arrival date & time: 03/15/2011  3:08 PM   First MD Initiated Contact with Patient 03/15/11 1512      Chief Complaint  Patient presents with  . Sickle Cell Pain Crisis    (Consider location/radiation/quality/duration/timing/severity/associated sxs/prior treatment) Patient is a 20 y.o. female presenting with sickle cell pain. The history is provided by the patient. No language interpreter was used.  Sickle Cell Pain Crisis  This is a new problem. The current episode started yesterday. The onset was gradual. The problem occurs continuously. The problem has been gradually worsening. The pain is associated with an unknown factor. The pain is present in the lower extremities. The pain is similar to prior episodes. The pain is moderate. The symptoms are relieved by nothing. The symptoms are not relieved by acetaminophen. Pertinent negatives include no chest pain, no blurred vision, no photophobia, no abdominal pain, no nausea, no vomiting, no dysuria, no vaginal bleeding, no vaginal discharge, no congestion, no headaches, no rhinorrhea, no sore throat, no back pain, no joint pain, no neck pain, no loss of sensation, no tingling, no weakness, no cough and no eye pain. There is no swelling present. She has been behaving normally. She has been eating and drinking normally. She sickle cell type is SS. There have been frequent pain crises. She has received no recent medical care.    Past Medical History  Diagnosis Date  . Sickle cell anemia with crisis   . Sickle cell anemia     Past Surgical History  Procedure Date  . Cholecystectomy   . Choley     History reviewed. No pertinent family history.  History  Substance Use Topics  . Smoking status: Former Smoker    Types: Cigars  . Smokeless tobacco: Never Used  . Alcohol Use: No    OB History    Grav Para Term Preterm Abortions TAB SAB Ect Mult Living   1               Review of Systems  Constitutional:  Negative for fever, activity change, appetite change and fatigue.  HENT: Negative for congestion, sore throat, rhinorrhea and neck pain.   Eyes: Negative for blurred vision, photophobia and pain.  Respiratory: Negative for cough and shortness of breath.   Cardiovascular: Negative for chest pain and palpitations.  Gastrointestinal: Negative for nausea, vomiting and abdominal pain.  Genitourinary: Negative for dysuria, urgency, frequency, vaginal bleeding and vaginal discharge.  Musculoskeletal: Positive for myalgias and arthralgias. Negative for back pain and joint pain.  Neurological: Negative for dizziness, tingling, weakness, numbness and headaches.  All other systems reviewed and are negative.    Allergies  Toradol  Home Medications   Current Outpatient Rx  Name Route Sig Dispense Refill  . BUPROPION HCL ER (XL) 150 MG PO TB24 Oral Take 150 mg by mouth daily.      . CEPHALEXIN 500 MG PO CAPS Oral Take 500 mg by mouth 3 (three) times daily.      Marland Kitchen POLYETHYLENE GLYCOL 3350 PO POWD Oral Take 17 g by mouth daily as needed. For constipation     . FOLIC ACID 1 MG PO TABS Oral Take 1 mg by mouth daily.     Marland Kitchen NAPROXEN 500 MG PO TABS Oral Take 1 tablet (500 mg total) by mouth 2 (two) times daily. 30 tablet 0  . OXYCODONE-ACETAMINOPHEN 5-325 MG PO TABS Oral Take 2 tablets by mouth every 4 (four) hours as needed for pain.  15 tablet 0  . PRENATAL PLUS 27-1 MG PO TABS Oral Take 1 tablet by mouth daily.      Marland Kitchen RANITIDINE HCL 150 MG PO TABS Oral Take 1 tablet (150 mg total) by mouth 2 (two) times daily. 60 tablet 1    BP 116/70  Pulse 96  Temp(Src) 98.9 F (37.2 C) (Oral)  Resp 22  SpO2 98%  LMP 12/14/2010  Physical Exam  Nursing note and vitals reviewed. Constitutional: She is oriented to person, place, and time. She appears well-developed and well-nourished. No distress.  HENT:  Head: Normocephalic and atraumatic.  Mouth/Throat: Oropharynx is clear and moist.  Eyes: Conjunctivae and  EOM are normal. Pupils are equal, round, and reactive to light.  Neck: Normal range of motion. Neck supple.  Cardiovascular: Normal rate, normal heart sounds and intact distal pulses.  Exam reveals no gallop and no friction rub.   No murmur heard.      tachycardic  Pulmonary/Chest: Effort normal and breath sounds normal. No respiratory distress.  Abdominal: Soft. Bowel sounds are normal. There is no tenderness.  Musculoskeletal: Normal range of motion. She exhibits tenderness (With range of motion of the right knee and hip).  Neurological: She is alert and oriented to person, place, and time.  Skin: Skin is warm and dry. No rash noted.    ED Course  Procedures (including critical care time)  Labs Reviewed  CBC - Abnormal; Notable for the following:    WBC 15.2 (*)    RBC 3.24 (*)    Hemoglobin 9.5 (*)    HCT 27.3 (*)    Platelets 564 (*)    All other components within normal limits  DIFFERENTIAL - Abnormal; Notable for the following:    Neutro Abs 8.8 (*)    Monocytes Absolute 1.8 (*)    All other components within normal limits  RETICULOCYTES - Abnormal; Notable for the following:    Retic Ct Pct 13.9 (*)    RBC. 3.30 (*)    Retic Count, Manual 458.7 (*)    All other components within normal limits  URINALYSIS, ROUTINE W REFLEX MICROSCOPIC - Abnormal; Notable for the following:    Color, Urine AMBER (*) BIOCHEMICALS MAY BE AFFECTED BY COLOR   All other components within normal limits  BASIC METABOLIC PANEL  PREGNANCY, URINE   Dg Hip Complete Right  03/15/2011  *RADIOLOGY REPORT*  Clinical Data: Sickle cell anemia pain, no injury  RIGHT HIP - COMPLETE 2+ VIEW  Comparison: None  Findings: Both hips are in normal position.  No acute abnormality is seen.  There is a sclerotic focus within the left femoral head and neck which may represent an area of bone infarct in this patient with sickle cell disease.  The pelvic rami are intact.  The SI joints appear normal.  IMPRESSION: No  acute abnormality.  Sclerotic focus in the left femoral head and neck may be due to an infarct from sickle cell disease.  Original Report Authenticated By: Juline Patch, M.D.   Dg Knee Complete 4 Views Right  03/15/2011  *RADIOLOGY REPORT*  Clinical Data: Sickle cell pain crisis, right knee pain  RIGHT KNEE - COMPLETE 4+ VIEW  Comparison: None.  Findings: No acute bony abnormality is seen.  Knee joint spaces appear appear normal.  No joint effusion is seen.  There is inhomogeneity to the proximal right tibia which may represent an area of bone infarction.  IMPRESSION: No acute abnormality.  Cannot exclude a subtle area of  bone infarction in the proximal right tibia.  Original Report Authenticated By: Juline Patch, M.D.     1. Sickle cell pain crisis       MDM  CBC without significant anemia. Reticulocyte count appropriately elevated. White blood cell count elevated secondary to her pain crisis. Radiology showed possible small bone infarcts. Her pain was controlled with IV fluids, 3 doses of the allotted. Patient is able to ambulate without difficulty. Expressed her pain was controlled which to be discharged to home. She'll be discharged home with Percocet and Naprosyn for symptom control. She's instructed to followup with her primary care physician in one week. She is provided signs and symptoms for which returned emergency department        Dayton Bailiff, MD 03/15/11 [id number]

## 2011-03-15 NOTE — ED Notes (Signed)
Pt. Able to ambulate with no difficulty and assisstance was provided

## 2011-03-15 NOTE — ED Notes (Signed)
Pt. Reports no vomiting or diarrhea just pain.

## 2011-03-15 NOTE — ED Notes (Signed)
No IV was in right wrist upon pt arrival.

## 2011-03-15 NOTE — ED Notes (Signed)
Pt. Wants to go home today.  Dr. Brooke Dare wants Pt. Walked.

## 2011-03-15 NOTE — ED Notes (Signed)
Pt here with severe leg pain having a sickle cell crisis that started yesterday crying at present

## 2011-03-18 NOTE — H&P (Signed)
NAME:  Nancy, [name] NO.:  [id number]  MEDICAL RECORD NO.:  [id number]  LOCATION:                                 FACILITY:  PHYSICIAN:  Paula Compton, MD        DATE OF BIRTH:  [birth year]  DATE OF ADMISSION:  01/26/2011 DATE OF DISCHARGE:                             HISTORY & PHYSICAL   PRIMARY CARE PHYSICIAN:  [name], MD at [facility].  CHIEF COMPLAINT:  Back pain.  HISTORY OF PRESENT ILLNESS:  [name] is a 20 year old female with sickle cell SS disease, is well known to [employer], who presents today with a 2-day history of worsening back pain. She was recently admitted to the hospital for sickle cell crisis and discharged home on January 17, 2011, and she has been developing mild pain since discharge.  Starting on September 12, she experienced severe back pain due to sickle cell crisis pain.  She presented to [facility] where she had an ultrasound (results below) and was observed and sent home with refills for her pain medications.  However, she has not been able to sleep due to the pain and therefore she presented to [facility] ED on the evening of January 25, 2011.  She was kept in the CDU and given IV Dilaudid q.2 h and still complaining of intractable pain and therefore [facility] was called for admission.  She currently denies any urinary discharge or dysuria, fevers, chills, cough, chest pain, or shortness of breath.  She endorses back pain, some very minimal abdominal pain, and some nausea without vomiting.  PAST MEDICAL HISTORY: 1. Sickle cell anemia SS disease. 2. Multiple admissions for sickle cell pain crises.  PAST SURGICAL HISTORY:  Cholecystectomy.  FAMILY HISTORY:  She has history of sickle cell disease.  SOCIAL HISTORY:  She reports she has quit smoking.  She occasionally smokes cigars, but does not smoke since she is pregnant.  She does not drug  alcohol or use illicit drugs.  She lives at home with her boyfriend.  ALLERGIES:  TORADOL.  MEDICATIONS: 1. Percocet 5/325 mg one to two tablets p.o. p.r.n. 2. Folic acid 4 mg p.o. daily. 3. Prenatal vitamins.  REVIEW OF SYSTEMS:  12-point review of systems completely negative except per HPI.  PHYSICAL EXAM:  VITAL SIGNS:  Showed temperature of 98, heart rate 90, blood pressure 136/78, respiratory rate 18. GENERAL:  Alert and oriented, in mild distress, well developed, well nourished. HEENT:  Normocephalic, atraumatic.  Pupils are equal and reactive to light.  Extraocular movements intact.  Conjunctivae normal.  Right ear within normal limits.  Mucous membranes moist. NECK:  Shows normal range of motion and supple. CARDIOVASCULAR:  Regular rate and rhythm with a grade 2/6 systolic ejection murmur noted. LUNGS:  Respiratory rate is good.  Good aeration throughout.  No wheezes, rales, or rhonchi noted. GI:  Soft.  No distention.  Minimal lower quadrant tenderness without guarding or rebound.  Good bowel sounds throughout. MUSCULOSKELETAL:  Normal range of motion.  She does have some tenderness to palpation along her lumbar, pelvic, or spine.  Does not appear to have CVA  tenderness. NEURO:  Alert and oriented x3.  Normal reflexes.  No cranial nerve deficits.  No focal deficits. SKIN:  Warm and dry without rashes or erythema. PSYCH:  Normal mood and affect.  Behavior is normal.  LABS:  CBC showed white blood cells 15.3, hemoglobin 7.7, hematocrit 22.4, platelets 580.  BMET showed sodium 137, potassium 3.5, chloride 102, CO2 of 25, glucose 99, BUN 4, creatinine less than 0.47, calcium 9.3.  IMAGING:  Transvaginal ultrasound showed single intrauterine gestational sac with a mean sac diameter of 1.1 cm, corresponding to gestational age 4.6 weeks, and this age may not yet been visible.  Yolk sac is now seen. Gestational sac has been slightly less than expected since  prior ultrasound, but still remains within normal limits.  ASSESSMENT/PLAN:  [name] is a 20 year old female, now pregnant with back pain typical for sickle cell pain crisis. 1. Sickle cell pain.  No other concerning signs or symptoms by history     or exam.  No vaginal bleeding.  Plan is for the patient on Dilaudid     PCA without a basal rate limit for bed.  The patient believes she     could likely go home tomorrow __________ as well as sleep aid.  She     feels that a good night's sleep will go a long way in controlling     her pain.  If her pain does not abate, we might need to add basal     rate for Dilaudid, but for now we will keep p.r.n. dosing with an     eye towards transitioning to p.o. medications in the a.m..  No     signs of acute chest.  No splenomegaly.  CBC currently stable. 2. Sickle cell SS disease.  The patient will need to be followed at     high-risk clinic.  We will continue 4 mg of folic acid.  She is to     start her hydroxyurea on her own.  Follow CBC. 3. Sleep issues.  We will provide the patient with 1 dose of Ambien     tonight to help her sleep. 4. Pregnancy high-risk as above.  Continue prenatal vitamins.  She     already has an appointment scheduled at high-risk clinic.  She is     also scheduled for a PCP followup tomorrow.  She is scheduled to     have beta-hCG lab draw this week as this was decreased somewhat     from prior ultrasound. 5. Leukocytosis.  The patient seems to have baseline leukocytosis on     12/14.  White blood cells 15 currently.  Plan to follow serial     CBCs. 6. FEN/GI:  Continue normal saline and 125 mL an hour for the next 24     hours.  Regular adult diet as tolerated. 7. Prophylaxis.  Lovenox as she is pregnant. 8. Disposition:  Hopeful for quick turn around to her hospital course.     [name], MD   ______________________________ Paula Compton, MD    JW/MEDQ  D:  01/26/2011  T:  01/26/2011  Job:   [id number]  Electronically Signed by [name]  on 03/17/2011 03:36:11 PM Electronically Signed by Paula Compton MD on 03/18/2011 08:16:33 AM

## 2011-03-19 ENCOUNTER — Emergency Department (HOSPITAL_COMMUNITY): Payer: Medicaid Other

## 2011-03-19 ENCOUNTER — Encounter: Payer: Self-pay | Admitting: Family Medicine

## 2011-03-19 ENCOUNTER — Inpatient Hospital Stay (HOSPITAL_COMMUNITY)
Admission: EM | Admit: 2011-03-19 | Discharge: 2011-03-22 | DRG: 812 | Disposition: A | Discharge status: Home / Self Care | Payer: Medicaid Other | Source: Ambulatory Visit | Attending: Family Medicine | Admitting: Family Medicine

## 2011-03-19 DIAGNOSIS — D571 Sickle-cell disease without crisis: Secondary | ICD-10-CM

## 2011-03-19 DIAGNOSIS — K219 Gastro-esophageal reflux disease without esophagitis: Secondary | ICD-10-CM | POA: Diagnosis present

## 2011-03-19 DIAGNOSIS — R110 Nausea: Secondary | ICD-10-CM | POA: Diagnosis present

## 2011-03-19 DIAGNOSIS — Z79899 Other long term (current) drug therapy: Secondary | ICD-10-CM

## 2011-03-19 DIAGNOSIS — D5700 Hb-SS disease with crisis, unspecified: Principal | ICD-10-CM | POA: Diagnosis present

## 2011-03-19 DIAGNOSIS — Z87891 Personal history of nicotine dependence: Secondary | ICD-10-CM

## 2011-03-19 HISTORY — DX: Gastro-esophageal reflux disease without esophagitis: K21.9

## 2011-03-19 HISTORY — DX: Major depressive disorder, single episode, unspecified: F32.9

## 2011-03-19 LAB — URINALYSIS, ROUTINE W REFLEX MICROSCOPIC
Glucose, UA: NEGATIVE mg/dL
Hgb urine dipstick: NEGATIVE
Protein, ur: NEGATIVE mg/dL
Specific Gravity, Urine: 1.014 (ref 1.005–1.030)

## 2011-03-19 LAB — RETICULOCYTES: Retic Ct Pct: 19 % — ABNORMAL HIGH (ref 0.4–3.1)

## 2011-03-19 LAB — DIFFERENTIAL
Basophils Absolute: 0.2 10*3/uL — ABNORMAL HIGH (ref 0.0–0.1)
Lymphs Abs: 3.9 10*3/uL (ref 0.7–4.0)
Monocytes Absolute: 1.7 10*3/uL — ABNORMAL HIGH (ref 0.1–1.0)
Monocytes Relative: 11 % (ref 3–12)
Neutro Abs: 8.6 10*3/uL — ABNORMAL HIGH (ref 1.7–7.7)

## 2011-03-19 LAB — CBC
HCT: 26 % — ABNORMAL LOW (ref 36.0–46.0)
Hemoglobin: 7.9 g/dL — ABNORMAL LOW (ref 12.0–15.0)
MCH: 29.6 pg (ref 26.0–34.0)
MCHC: 33.9 g/dL (ref 30.0–36.0)
MCHC: 34.6 g/dL (ref 30.0–36.0)
MCV: 86.7 fL (ref 78.0–100.0)
RDW: 17.4 % — ABNORMAL HIGH (ref 11.5–15.5)
RDW: 17.1 % — ABNORMAL HIGH (ref 11.5–15.5)

## 2011-03-19 LAB — BASIC METABOLIC PANEL
BUN: 5 mg/dL — ABNORMAL LOW (ref 6–23)
Chloride: 103 mEq/L (ref 96–112)
Creatinine, Ser: 0.56 mg/dL (ref 0.50–1.10)
GFR calc Af Amer: >90 mL/min (ref >90)
GFR calc non Af Amer: >90 mL/min (ref >90)

## 2011-03-19 NOTE — H&P (Signed)
[name] is an 20 y.o. female.    Chief Complaint: sickle cell crisis, anemia  HPI: 20 yo AA female with a history of sickle cell SS disease presents to [facility] in pain crisis.  Pain is located at left upper extremity, lower lumbar spine, and left hand.  States that this is similar to her typical pain crisis.  Pain started about a week ago.  She was given a Rx for Percocet on 10/30 but was unable to fill it due to transportation issues, and her pain has been getting progressively worse.  Patient says she is able to ambulate.  She complains of decreased appetite and nausea, but no vomiting.  Denies fever, chills, chest pain, night sweats, or productive cough.  Denies any diarrhea/constipation or abdominal pain.  She is concerned that her hands are unusually cold and thinks that her Hgb is low.  Currently Hgb 7.9 (baseline 8-9).    Past Medical History  Diagnosis Date  . Sickle cell anemia with crisis   . Sickle cell anemia     Past Surgical History  Procedure Date  . Cholecystectomy   . Choley     FAMILY HISTORY:  Positive for Sickle Cell trait.   Social History:  reports that she has quit smoking. Her smoking use included Cigars. She has never used smokeless tobacco. She reports that she does not drink alcohol or use illicit drugs.  Allergies:  Allergies  Allergen Reactions  . Toradol Hives and Itching    Some itching, give with benedryl    No current facility-administered medications on file as of 03/19/2011.   Medications Prior to Admission  Medication Sig Dispense Refill  . buPROPion (WELLBUTRIN XL) 150 MG 24 hr tablet Take 150 mg by mouth daily.        . cephALEXin (KEFLEX) 500 MG capsule Take 500 mg by mouth 3 (three) times daily.        . folic acid (FOLVITE) 1 MG tablet Take 1 mg by mouth daily.       . naproxen (NAPROSYN) 500 MG tablet Take 1 tablet (500 mg total) by mouth 2 (two) times daily.  30 tablet  0  . oxyCODONE-acetaminophen (PERCOCET) 5-325 MG per tablet  Take 2 tablets by mouth every 4 (four) hours as needed for pain.  15 tablet  0  . polyethylene glycol powder (GLYCOLAX/MIRALAX) powder Take 17 g by mouth daily as needed. For constipation       . prenatal vitamin w/FE, FA (PRENATAL 1 + 1) 27-1 MG TABS Take 1 tablet by mouth daily.        . ranitidine (ZANTAC) 150 MG tablet Take 1 tablet (150 mg total) by mouth 2 (two) times daily.  60 tablet  1    Results for orders placed during the hospital encounter of 03/19/11 (from the past 48 hour(s))  URINALYSIS, ROUTINE W REFLEX MICROSCOPIC     Status: Normal   Collection Time   03/19/11  5:23 AM      Component Value Range Comment   Color, Urine YELLOW  YELLOW     Appearance CLEAR  CLEAR     Specific Gravity, Urine 1.014  1.005 - 1.030     pH 6.0  5.0 - 8.0     Glucose, UA NEGATIVE  NEGATIVE (mg/dL)    Hgb urine dipstick NEGATIVE  NEGATIVE     Bilirubin Urine NEGATIVE  NEGATIVE     Ketones, ur NEGATIVE  NEGATIVE (mg/dL)    Protein, ur  NEGATIVE  NEGATIVE (mg/dL)    Urobilinogen, UA 1.0  0.0 - 1.0 (mg/dL)    Nitrite NEGATIVE  NEGATIVE     Leukocytes, UA NEGATIVE  NEGATIVE  MICROSCOPIC NOT DONE ON URINES WITH NEGATIVE PROTEIN, BLOOD, LEUKOCYTES, NITRITE, OR GLUCOSE <1000 mg/dL.  DIFFERENTIAL     Status: Abnormal   Collection Time   03/19/11  5:34 AM      Component Value Range Comment   Neutrophils Relative 58  43 - 77 (%)    Lymphocytes Relative 26  12 - 46 (%)    Monocytes Relative 11  3 - 12 (%)    Eosinophils Relative 4  0 - 5 (%)    Basophils Relative 1  0 - 1 (%)    Neutro Abs 8.6 (*) 1.7 - 7.7 (K/uL)    Lymphs Abs 3.9  0.7 - 4.0 (K/uL)    Monocytes Absolute 1.7 (*) 0.1 - 1.0 (K/uL)    Eosinophils Absolute 0.6  0.0 - 0.7 (K/uL)    Basophils Absolute 0.2 (*) 0.0 - 0.1 (K/uL)    RBC Morphology POLYCHROMASIA PRESENT     CBC     Status: Abnormal   Collection Time   03/19/11  5:34 AM      Component Value Range Comment   WBC 15.0 (*) 4.0 - 10.5 (K/uL)    RBC 2.67 (*) 3.87 - 5.11 (MIL/uL)      Hemoglobin 7.9 (*) 12.0 - 15.0 (g/dL)    HCT 91.4 (*) 78.2 - 46.0 (%)    MCV 87.3  78.0 - 100.0 (fL)    MCH 29.6  26.0 - 34.0 (pg)    MCHC 33.9  30.0 - 36.0 (g/dL)    RDW 95.6 (*) 21.3 - 15.5 (%)    Platelets 448 (*) 150 - 400 (K/uL)   RETICULOCYTES     Status: Abnormal   Collection Time   03/19/11  6:18 AM      Component Value Range Comment   Retic Ct Pct 19.0 (*) 0.4 - 3.1 (%)    RBC. 2.64 (*) 3.87 - 5.11 (MIL/uL)    Retic Count, Manual 501.6 (*) 19.0 - 186.0 (K/uL)     ROS  Per HPI  Last menstrual period 12/14/2010. Physical Exam  Constitutional: She is oriented to person, place, and time. She appears well-developed and well-nourished. No distress.  HENT:  Head: Normocephalic and atraumatic.  Mouth/Throat: Oropharynx is clear and moist.  Eyes: Conjunctivae and EOM are normal. Pupils are equal, round, and reactive to light.  Neck: Normal range of motion.  Cardiovascular: Normal rate and regular rhythm.  Exam reveals no gallop and no friction rub.   No murmur heard. Respiratory: Breath sounds normal. No respiratory distress. She has no wheezes. She has no rales. She exhibits no tenderness.  GI: Soft. Bowel sounds are normal. She exhibits no distension. There is no tenderness. There is no rebound and no guarding.  Musculoskeletal: She exhibits tenderness. She exhibits no edema.       Arms: Lymphadenopathy:    She has no cervical adenopathy.  Neurological: She is alert and oriented to person, place, and time.  Skin: Skin is warm and dry.  Psychiatric: She has a normal mood and affect.     Assessment/Plan 20 yo AA female with a history of sickle cell SS disease presents with pain crisis:  1. Vaso-occlusive pain crisis with sickle cell SS disease: crisis likely secondary to non-compliance with pain medications at home.  Will admit to floor  and start Dilaudid 0.5-1 mg IV q 2 hours prn for pain and Tylenol 650 mg  q 4 hr scheduled.  If this does not relieve pain, may consider  Dilaudid PCA.  Will monitor pain and attempt to wean to Percocet in the morning.  Continuous pulse ox.  Will order a CXR 2-view to rule out acute chest.  2. Nausea: Patient able to tolerated PO, but will give Phenergan 12.5 mg q 6 prn and Zofran 4 mg q 6 prn for nausea/vomiting.   3. Anemia: Hgb stable at 7.9.  No evidence of unstable or active bleeding.  Will repeat CBC at 1600.  Transfusion threshold < 7.0 if symptomatic.  Will check am labs.  4. Psych: Will restart Wellbutrin.  5. FEN/GI: Clear liquids for now, and advance as tolerated.  Patient did not appear dehydrated.  Will KVO IVF.    6. PPx: Heparin 5000 [place] TID.  7. Dispo: pending clinical improvement, anticipate quick recovery and discharge home in 1-2 days  DE LA CRUZ, 03/19/2011, 9:39 AM

## 2011-03-20 LAB — CBC
HCT: 24.8 % — ABNORMAL LOW (ref 36.0–46.0)
HCT: 26.4 % — ABNORMAL LOW (ref 36.0–46.0)
Hemoglobin: 9.1 g/dL — ABNORMAL LOW (ref 12.0–15.0)
MCH: 29.4 pg (ref 26.0–34.0)
MCHC: 34.5 g/dL (ref 30.0–36.0)
MCV: 86.1 fL (ref 78.0–100.0)
MCV: 85.4 fL (ref 78.0–100.0)
Platelets: 496 K/uL — ABNORMAL HIGH (ref 150–400)
RBC: 2.88 MIL/uL — ABNORMAL LOW (ref 3.87–5.11)
RBC: 3.09 MIL/uL — ABNORMAL LOW (ref 3.87–5.11)
RDW: 16.4 % — ABNORMAL HIGH (ref 11.5–15.5)
WBC: 13.5 10*3/uL — ABNORMAL HIGH (ref 4.0–10.5)
WBC: 16.2 K/uL — ABNORMAL HIGH (ref 4.0–10.5)

## 2011-03-20 LAB — BASIC METABOLIC PANEL
BUN: 5 mg/dL — ABNORMAL LOW (ref 6–23)
CO2: 24 mEq/L (ref 19–32)
Chloride: 104 mEq/L (ref 96–112)
Creatinine, Ser: 0.61 mg/dL (ref 0.50–1.10)

## 2011-03-20 MED ORDER — SODIUM CHLORIDE 0.45 % IV SOLN
INTRAVENOUS | Status: DC
  Administered 2011-03-20: 06:00:00 via INTRAVENOUS

## 2011-03-20 MED ORDER — DEXTROSE-NACL 5-0.9 % IV SOLN
INTRAVENOUS | Status: AC
  Administered 2011-03-20 – 2011-03-21 (×2): via INTRAVENOUS

## 2011-03-20 MED ORDER — ACETAMINOPHEN 325 MG PO TABS
650.0000 mg | ORAL_TABLET | ORAL | Status: DC | PRN
  Filled 2011-03-20: qty 2

## 2011-03-20 MED ORDER — FAMOTIDINE 20 MG PO TABS
20.0000 mg | ORAL_TABLET | Freq: Two times a day (BID) | ORAL | Status: DC
  Administered 2011-03-20 – 2011-03-22 (×5): 20 mg via ORAL
  Filled 2011-03-20 (×6): qty 1

## 2011-03-20 MED ORDER — IBUPROFEN 600 MG PO TABS
600.0000 mg | ORAL_TABLET | Freq: Three times a day (TID) | ORAL | Status: DC
  Administered 2011-03-21 – 2011-03-22 (×5): 600 mg via ORAL
  Filled 2011-03-20 (×7): qty 1

## 2011-03-20 MED ORDER — HYDROMORPHONE HCL PF 1 MG/ML IJ SOLN
0.5000 mg | INTRAMUSCULAR | Status: DC | PRN
  Administered 2011-03-20 – 2011-03-21 (×5): 1 mg via INTRAVENOUS
  Filled 2011-03-20 (×6): qty 1

## 2011-03-20 MED ORDER — HYDROMORPHONE HCL PF 1 MG/ML IJ SOLN
1.0000 mg | Freq: Once | INTRAMUSCULAR | Status: DC
  Filled 2011-03-20 (×2): qty 1

## 2011-03-20 MED ORDER — ONDANSETRON HCL 4 MG PO TABS
4.0000 mg | ORAL_TABLET | Freq: Four times a day (QID) | ORAL | Status: DC | PRN
  Administered 2011-03-21: 4 mg via ORAL
  Filled 2011-03-20: qty 1

## 2011-03-20 MED ORDER — BUPROPION HCL ER (XL) 150 MG PO TB24
150.0000 mg | ORAL_TABLET | Freq: Every day | ORAL | Status: DC
  Administered 2011-03-21 – 2011-03-22 (×2): 150 mg via ORAL
  Filled 2011-03-20 (×2): qty 1

## 2011-03-20 MED ORDER — PROMETHAZINE HCL 12.5 MG PO TABS: 12.5000 mg | ORAL_TABLET | Freq: Four times a day (QID) | ORAL | Status: DC | PRN

## 2011-03-20 MED ORDER — HEPARIN SODIUM (PORCINE) 5000 UNIT/ML IJ SOLN
5000.0000 [IU] | Freq: Three times a day (TID) | INTRAMUSCULAR | Status: DC
  Administered 2011-03-20 – 2011-03-21 (×5): 5000 [IU] via SUBCUTANEOUS
  Filled 2011-03-20 (×10): qty 1

## 2011-03-20 NOTE — H&P (Signed)
[name] is an 20 y.o. female.    Chief Complaint: sickle cell crisis, anemia  HPI: 20 yo AA female with a history of sickle cell SS disease presents to [facility] in pain crisis.  Pain is located at left upper extremity, lower lumbar spine, and left hand.  States that this is similar to her typical pain crisis.  Pain started about a week ago.  She was given a Rx for Percocet on 10/30 but was unable to fill it due to transportation issues, and her pain has been getting progressively worse.  Patient says she is able to ambulate.  She complains of decreased appetite and nausea, but no vomiting.  Denies fever, chills, chest pain, night sweats, or productive cough.  Denies any diarrhea/constipation or abdominal pain.  She is concerned that her hands are unusually cold and thinks that her Hgb is low.  Currently Hgb 7.9 (baseline 8-9).    Past Medical History  Diagnosis Date  . Sickle cell anemia with crisis   . Sickle cell anemia     Past Surgical History  Procedure Date  . Cholecystectomy   . Choley     FAMILY HISTORY:  Positive for Sickle Cell trait.   Social History:  reports that she has quit smoking. Her smoking use included Cigars. She has never used smokeless tobacco. She reports that she does not drink alcohol or use illicit drugs.  Allergies:  Allergies  Allergen Reactions  . Toradol Hives and Itching    Some itching, give with benedryl    Medications Prior to Admission  Medication Dose Route Frequency Provider Last Rate Last Dose  . acetaminophen (TYLENOL) tablet 650 mg  650 mg Oral Q4H PRN Lora Poteet Seay, PHARMD      . dextrose 5 %-0.9 % sodium chloride infusion   Intravenous Continuous Ivy de La Cruz      . famotidine (PEPCID) tablet 20 mg  20 mg Oral BID Lora Poteet Seay, PHARMD      . heparin injection 5,000 Units  5,000 Units Subcutaneous Q8H Lora Poteet Seay, PHARMD   5,000 Units at 03/20/11 0609  . HYDROmorphone (DILAUDID) injection 0.5-1 mg  0.5-1 mg Intravenous  Q2H PRN Lora Poteet Seay, PHARMD   1 mg at 03/20/11 0608  . ondansetron (ZOFRAN) tablet 4 mg  4 mg Oral Q6H PRN Lora Poteet Seay, PHARMD      . promethazine (PHENERGAN) tablet 12.5 mg  12.5 mg Oral Q6H PRN Lora Poteet Seay, PHARMD      . DISCONTD: 0.45 % sodium chloride infusion   Intravenous Continuous Lora Poteet Seay, PHARMD 20 mL/hr at 03/20/11 0545     Medications Prior to Admission  Medication Sig Dispense Refill  . cephALEXin (KEFLEX) 500 MG capsule Take 500 mg by mouth 3 (three) times daily.        Marland Kitchen oxyCODONE-acetaminophen (PERCOCET) 5-325 MG per tablet Take 2 tablets by mouth every 4 (four) hours as needed for pain.  15 tablet  0  . polyethylene glycol powder (GLYCOLAX/MIRALAX) powder Take 17 g by mouth 2 (two) times daily as needed. For constipation        Results for orders placed during the hospital encounter of 03/19/11 (from the past 48 hour(s))  URINALYSIS, ROUTINE W REFLEX MICROSCOPIC     Status: Normal   Collection Time   03/19/11  5:23 AM      Component Value Range Comment   Color, Urine YELLOW  YELLOW     Appearance CLEAR  CLEAR     Specific Gravity, Urine 1.014  1.005 - 1.030     pH 6.0  5.0 - 8.0     Glucose, UA NEGATIVE  NEGATIVE (mg/dL)    Hgb urine dipstick NEGATIVE  NEGATIVE     Bilirubin Urine NEGATIVE  NEGATIVE     Ketones, ur NEGATIVE  NEGATIVE (mg/dL)    Protein, ur NEGATIVE  NEGATIVE (mg/dL)    Urobilinogen, UA 1.0  0.0 - 1.0 (mg/dL)    Nitrite NEGATIVE  NEGATIVE     Leukocytes, UA NEGATIVE  NEGATIVE  MICROSCOPIC NOT DONE ON URINES WITH NEGATIVE PROTEIN, BLOOD, LEUKOCYTES, NITRITE, OR GLUCOSE <1000 mg/dL.  PREGNANCY, URINE     Status: Normal   Collection Time   03/19/11  5:23 AM      Component Value Range Comment   Preg Test, Ur NEGATIVE     DIFFERENTIAL     Status: Abnormal   Collection Time   03/19/11  5:34 AM      Component Value Range Comment   Neutrophils Relative 58  43 - 77 (%)    Lymphocytes Relative 26  12 - 46 (%)    Monocytes Relative 11   3 - 12 (%)    Eosinophils Relative 4  0 - 5 (%)    Basophils Relative 1  0 - 1 (%)    Neutro Abs 8.6 (*) 1.7 - 7.7 (K/uL)    Lymphs Abs 3.9  0.7 - 4.0 (K/uL)    Monocytes Absolute 1.7 (*) 0.1 - 1.0 (K/uL)    Eosinophils Absolute 0.6  0.0 - 0.7 (K/uL)    Basophils Absolute 0.2 (*) 0.0 - 0.1 (K/uL)    RBC Morphology POLYCHROMASIA PRESENT     CBC     Status: Abnormal   Collection Time   03/19/11  5:34 AM      Component Value Range Comment   WBC 15.0 (*) 4.0 - 10.5 (K/uL)    RBC 2.67 (*) 3.87 - 5.11 (MIL/uL)    Hemoglobin 7.9 (*) 12.0 - 15.0 (g/dL)    HCT 16.1 (*) 09.6 - 46.0 (%)    MCV 87.3  78.0 - 100.0 (fL)    MCH 29.6  26.0 - 34.0 (pg)    MCHC 33.9  30.0 - 36.0 (g/dL)    RDW 04.5 (*) 40.9 - 15.5 (%)    Platelets 448 (*) 150 - 400 (K/uL)   RETICULOCYTES     Status: Abnormal   Collection Time   03/19/11  6:18 AM      Component Value Range Comment   Retic Ct Pct 19.0 (*) 0.4 - 3.1 (%)    RBC. 2.64 (*) 3.87 - 5.11 (MIL/uL)    Retic Count, Manual 501.6 (*) 19.0 - 186.0 (K/uL)   CBC     Status: Abnormal   Collection Time   03/19/11  5:19 PM      Component Value Range Comment   WBC 14.1 (*) 4.0 - 10.5 (K/uL)    RBC 3.00 (*) 3.87 - 5.11 (MIL/uL)    Hemoglobin 9.0 (*) 12.0 - 15.0 (g/dL)    HCT 81.1 (*) 91.4 - 46.0 (%)    MCV 86.7  78.0 - 100.0 (fL)    MCH 30.0  26.0 - 34.0 (pg)    MCHC 34.6  30.0 - 36.0 (g/dL)    RDW 78.2 (*) 95.6 - 15.5 (%)    Platelets 498 (*) 150 - 400 (K/uL)   BASIC METABOLIC PANEL  Status: Abnormal   Collection Time   03/19/11  5:19 PM      Component Value Range Comment   Sodium 137  135 - 145 (mEq/L)    Potassium 3.7  3.5 - 5.1 (mEq/L)    Chloride 103  96 - 112 (mEq/L)    CO2 23  19 - 32 (mEq/L)    Glucose, Bld 98  70 - 99 (mg/dL)    BUN 5 (*) 6 - 23 (mg/dL)    Creatinine, Ser 4.09  0.50 - 1.10 (mg/dL)    Calcium 9.2  8.4 - 10.5 (mg/dL)    GFR calc non Af Amer >90  >90 (mL/min)    GFR calc Af Amer >90  >90 (mL/min)   CBC     Status: Abnormal    Collection Time   03/20/11  6:30 AM      Component Value Range Comment   WBC 13.5 (*) 4.0 - 10.5 (K/uL)    RBC 2.88 (*) 3.87 - 5.11 (MIL/uL)    Hemoglobin 8.5 (*) 12.0 - 15.0 (g/dL)    HCT 81.1 (*) 91.4 - 46.0 (%)    MCV 86.1  78.0 - 100.0 (fL)    MCH 29.5  26.0 - 34.0 (pg)    MCHC 34.3  30.0 - 36.0 (g/dL)    RDW 78.2 (*) 95.6 - 15.5 (%)    Platelets 487 (*) 150 - 400 (K/uL)   BASIC METABOLIC PANEL     Status: Abnormal   Collection Time   03/20/11  6:30 AM      Component Value Range Comment   Sodium 137  135 - 145 (mEq/L)    Potassium 3.6  3.5 - 5.1 (mEq/L)    Chloride 104  96 - 112 (mEq/L)    CO2 24  19 - 32 (mEq/L)    Glucose, Bld 84  70 - 99 (mg/dL)    BUN 5 (*) 6 - 23 (mg/dL)    Creatinine, Ser 2.13  0.50 - 1.10 (mg/dL)    Calcium 9.3  8.4 - 10.5 (mg/dL)    GFR calc non Af Amer >90  >90 (mL/min)    GFR calc Af Amer >90  >90 (mL/min)     ROS   Per HPI  Blood pressure 110/70, pulse 86, temperature 98.4 F (36.9 C), resp. rate 18, height 5\' 3"  (1.6 m), weight 168 lb (76.204 kg), last menstrual period 12/14/2010, SpO2 98.00%. Physical Exam  Constitutional: She is oriented to person, place, and time. She appears well-developed and well-nourished. No distress.  HENT:  Head: Normocephalic and atraumatic.  Mouth/Throat: Oropharynx is clear and moist.  Eyes: Conjunctivae and EOM are normal. Pupils are equal, round, and reactive to light.  Neck: Normal range of motion.  Cardiovascular: Normal rate and regular rhythm.  Exam reveals no gallop and no friction rub.   No murmur heard. Respiratory: Breath sounds normal. No respiratory distress. She has no wheezes. She has no rales. She exhibits no tenderness.  GI: Soft. Bowel sounds are normal. She exhibits no distension. There is no tenderness. There is no rebound and no guarding.  Musculoskeletal: She exhibits tenderness. She exhibits no edema.       Arms: Lymphadenopathy:    She has no cervical adenopathy.  Neurological: She is  alert and oriented to person, place, and time.  Skin: Skin is warm and dry.  Psychiatric: She has a normal mood and affect.     Assessment/Plan 20 yo AA female with a history of sickle  cell SS disease presents with pain crisis:  1. Vaso-occlusive pain crisis with sickle cell SS disease: crisis likely secondary to non-compliance with pain medications at home.  Will admit to floor and start Dilaudid 0.5-1 mg IV q 2 hours prn for pain and Tylenol 650 mg  q 4 hr scheduled.  If this does not relieve pain, may consider Dilaudid PCA.  Will monitor pain and attempt to wean to Percocet in the morning.  Continuous pulse ox.  Will order a CXR 2-view to rule out acute chest.  2. Nausea: Patient able to tolerated PO, but will give Phenergan 12.5 mg q 6 prn and Zofran 4 mg q 6 prn for nausea/vomiting.   3. Anemia: Hgb stable at 7.9.  No evidence of unstable or active bleeding.  Will repeat CBC at 1600.  Transfusion threshold < 7.0 if symptomatic.  Will check am labs.  4. Psych: Will restart Wellbutrin.  5. FEN/GI: Clear liquids for now, and advance as tolerated.  Patient did not appear dehydrated.  Will KVO IVF.    6. PPx: Heparin 5000 [place] TID.  7. Dispo: pending clinical improvement, anticipate quick recovery and discharge home in 1-2 days   Ivy Sherron Flemings Cruz's Note above  Attending Admit note I have seen and examined this patient. I have discussed with Dr Tye Savoy .  I agree with their findings and plans as documented in their admit note.   Rogers Ditter D 03/20/2011, 10:03 AM

## 2011-03-20 NOTE — Progress Notes (Signed)
Clinical Social Worker completed the psychosocial assessment which can be found in the shadow chart.  Billye Pickerel MSW, LCSWA [facility]. Weekend/Social Worker [phone]    

## 2011-03-20 NOTE — Progress Notes (Signed)
**Note Nancy-Identified via Obfuscation** Subjective:  No acute events overnight. Patient still complains of left upper shoulder the pain. She continues to require Dilaudid every 2 hours for pain. Still endorses decreased appetite.  Objective: Vital signs in last 24 hours: Temp:  [98.2 F (36.8 C)-98.6 F (37 C)] 98.4 F (36.9 C) (11/04 0537) Pulse Rate:  [66-96] 86  (11/04 0537) Resp:  [16-18] 18  (11/04 0537) BP: (89-120)/(49-70) 110/70 mmHg (11/04 0537) SpO2:  [98 %] 98 % (11/04 0537) Weight:  [168 lb (76.204 kg)] 168 lb (76.204 kg) (11/03 1900) Weight change:     Intake/Output from previous day: 11/03 0701 - 11/04 0700 In: 220 [I.V.:220] Out: -  Intake/Output this shift:    General appearance: alert, cooperative and no distress Resp: clear to auscultation bilaterally Cardio: regular rate and rhythm, S1, S2 normal, no murmur, click, rub or gallop GI: soft, non-tender; bowel sounds normal; no masses,  no organomegaly Extremities: Tenderness on palpation of left hand and right shoulder, decreased range of motion of bilateral upper extremities Skin: Skin color, texture, turgor normal. No rashes or lesions  Lab Results:  Basename 03/20/11 0630 03/19/11 1719  WBC 13.5* 14.1*  HGB 8.5* 9.0*  HCT 24.8* 26.0*  PLT 487* 498*   BMET  Basename 03/20/11 0630 03/19/11 1719  NA 137 137  K 3.6 3.7  CL 104 103  CO2 24 23  GLUCOSE 84 98  BUN 5* 5*  CREATININE 0.61 0.56  CALCIUM 9.3 9.2    Studies/Results: Dg Pneumonia Chest 2v  03/19/2011  *RADIOLOGY REPORT*  Clinical Data: Chest pain.  Sickle cell disease.  CHEST - 2 VIEW  Comparison: 12/07/2010  Findings: The lungs are clear without focal infiltrate, edema, pneumothorax or pleural effusion.  Linear scarring in the right upper lobe is stable. The cardiopericardial silhouette is within normal limits for size.  Changes in the thoracolumbar spine are compatible with the reported history of sickle cell disease.  IMPRESSION: No acute cardiopulmonary process.  Original  Report Authenticated By: ERIC A. MANSELL, M.D.    Medications:  0.45 % sodium chloride infusion acetaminophen (TYLENOL) tablet 650 mg famotidine (PEPCID) tablet 20 mg heparin injection 5,000 Units HYDROmorphone (DILAUDID) injection 0.5-1 mg ondansetron (ZOFRAN) tablet 4 mg promethazine (PHENERGAN) tablet 12.5 mg   Assessment/Plan: 20 yo AA female with a history of sickle cell SS disease presents with pain crisis:  1.Vaso-occlusive crisis: Will continue Dilaudid 0.5-1 mg every 2 hours as needed for pain and Tylenol 650 mg q 4 hr scheduled. Will monitor pain and attempt to space out Dilaudid today.  Will attempt to transition to oral oxycodone later today.  Continuous pulse ox.  Chest x-ray was negative for acute cardiopulmonary disease. 2. Nausea: Continue Phenergan 12.5 mg q 6 prn and Zofran 4 mg q 6 prn for nausea.  Patient will need to be able to tolerate solid foods prior to discharge. 3. Anemia: Hgb stable at 8.5.  Will repeat CBC in the morning. 4. Psych: Will restart Wellbutrin.  5. FEN/GI: Advance diet as tolerated.  Patient has not eaten anything in the last 24 hours. Will hydrate with IV fluids at 75 cc an hour x1 day. 6. PPx: Heparin 5000 [place] TID.  7. Dispo: pending clinical improvement, anticipate quick recovery and discharge home early this week.   LOS: 1 day   [name],[name] 03/20/2011, 9:21 AM

## 2011-03-21 LAB — CBC
HCT: 26.3 % — ABNORMAL LOW (ref 36.0–46.0)
Hemoglobin: 9 g/dL — ABNORMAL LOW (ref 12.0–15.0)
MCHC: 34.2 g/dL (ref 30.0–36.0)
RBC: 3.06 MIL/uL — ABNORMAL LOW (ref 3.87–5.11)
WBC: 14.5 10*3/uL — ABNORMAL HIGH (ref 4.0–10.5)

## 2011-03-21 MED ORDER — OXYCODONE-ACETAMINOPHEN 5-325 MG PO TABS
1.0000 | ORAL_TABLET | Freq: Four times a day (QID) | ORAL | Status: DC
  Administered 2011-03-21 – 2011-03-22 (×5): 1 via ORAL
  Filled 2011-03-21 (×5): qty 1

## 2011-03-21 MED ORDER — DEXTROSE-NACL 5-0.9 % IV SOLN
INTRAVENOUS | Status: DC
  Administered 2011-03-21: 21:00:00 via INTRAVENOUS

## 2011-03-21 MED ORDER — HYDROMORPHONE HCL PF 1 MG/ML IJ SOLN
0.5000 mg | INTRAMUSCULAR | Status: DC | PRN
  Administered 2011-03-21 – 2011-03-22 (×4): 1 mg via INTRAVENOUS

## 2011-03-21 NOTE — Progress Notes (Signed)
Seen and examined.  [name] is feeling better but not yet well.  Still with Lt arm and back pain.  Tolerating oral diet and meds.  If improvement continues, anticipate DC tomorrow.

## 2011-03-21 NOTE — Progress Notes (Signed)
Family Medicine Teaching Service Intern Progress Note  Subjective: continues to have left arm pain which is typical of patient's pain crisis. No CP/sob. Incentive Spirometry not at bedside although ordered (told nurse). Patient says room very cold and that seems to make things worse (talked to nurse/secretary). Patient doesn't feel quite ready today. Has not been on orals yet.   Objective:  Temp:  [98.4 F (36.9 C)-98.9 F (37.2 C)] 98.6 F (37 C) (11/05 0557) Pulse Rate:  [73-101] 73  (11/05 0557) Resp:  [18] 18  (11/05 0557) BP: (90-107)/(62-71) 102/67 mmHg (11/05 0557) SpO2:  [97 %-99 %] 99 % (11/05 0557)  General appearance: alert, cooperative and mild distress. Left arm held up to body (apparently painful) Resp: clear to auscultation bilaterally Cardio: regular rate and rhythm, S1, S2 normal, no murmur, click, rub or gallop GI: soft, non-tender; bowel sounds normal; no masses,  no organomegaly Extremities: Tenderness on palpation of left hand and shoulder Skin: Skin color, texture, turgor normal. No rashes or lesions  Labs and imaging:   CBC-  Lab 03/21/11 0938 03/20/11 2012 03/20/11 0630  WBC 14.5* 16.2* 13.5*  HGB 9.0* 9.1* 8.5*  HCT 26.3* 26.4* 24.8*  PLT 512* 496* 487*   BMET  Lab 03/20/11 0630 03/19/11 1719 03/15/11 1531  NA 137 137 139  K 3.6 3.7 3.6  CL 104 103 105  CO2 24 23 23   BUN 5* 5* 6  CREATININE 0.61 0.56 0.60  LABGLOM -- -- --  GLUCOSE 84 -- --  CALCIUM 9.3 9.2 9.4    Dg Pneumonia Chest 2v  03/19/2011  *RADIOLOGY REPORT*  Clinical Data: Chest pain.  Sickle cell disease.  CHEST - 2 VIEW  Comparison: 12/07/2010  Findings: The lungs are clear without focal infiltrate, edema, pneumothorax or pleural effusion.  Linear scarring in the right upper lobe is stable. The cardiopericardial silhouette is within normal limits for size.  Changes in the thoracolumbar spine are compatible with the reported history of sickle cell disease.  IMPRESSION: No acute  cardiopulmonary process.  Original Report Authenticated By: ERIC A. MANSELL, M.D.     Assessment  20 yo AA female with a history of sickle cell SS disease presents with pain crisis:  1.Vaso-occlusive crisis:  -scheduled ibuprofen and percocet q6hours. Consider oxycodone for breakthrough on d/c. -Will continue Dilaudid 0.5-1 mg reduced to q3 for breakthrough pain. D/ced tylenol -Continuous pulse ox.  On admission, Chest x-ray was negative for acute cardiopulmonary disease. No cp/sob/increasing o2. Incentive spirometry for ppx acute chest.  2. Nausea: Continue Phenergan 12.5 mg q 6 prn and Zofran 4 mg q 6 prn for nausea. Has only used once recorded in 24 hours.  Patient tolerating solids. 3. Anemia: Hgb stable at 9.0. Will repeat CBC in the morning. 4. Psych: restarted home Wellbutrin.  5. GERD-Pepcid 20mg  per home meds  6. FEN/GI: regular diet. KVO IVF.  7. PPx: Heparin 5000 [place] TID. Incentive spirometry.  8. Dispo: pending clinical improvement, expect d/c  11/6. Of note, counseled patient to have boyfriend tested for sickle cell trait as they have had a previous pregnancy and continue to be sexually active, so that they can discuss their options. Breigh says she thinks it would be difficult for her to raise a child with sickle cell.  Tana Conch, MD PGY1, Family Medicine Teaching Service [phone]

## 2011-03-22 ENCOUNTER — Encounter (HOSPITAL_COMMUNITY): Payer: Self-pay | Admitting: Family Medicine

## 2011-03-22 DIAGNOSIS — D5700 Hb-SS disease with crisis, unspecified: Secondary | ICD-10-CM | POA: Diagnosis present

## 2011-03-22 DIAGNOSIS — Z8759 Personal history of other complications of pregnancy, childbirth and the puerperium: Secondary | ICD-10-CM | POA: Insufficient documentation

## 2011-03-22 DIAGNOSIS — O039 Complete or unspecified spontaneous abortion without complication: Secondary | ICD-10-CM

## 2011-03-22 HISTORY — DX: Complete or unspecified spontaneous abortion without complication: O03.9

## 2011-03-22 LAB — CBC
HCT: 23.5 % — ABNORMAL LOW (ref 36.0–46.0)
Hemoglobin: 7.9 g/dL — ABNORMAL LOW (ref 12.0–15.0)
Hemoglobin: 8.1 g/dL — ABNORMAL LOW (ref 12.0–15.0)
MCHC: 34.5 g/dL (ref 30.0–36.0)
MCV: 85.3 fL (ref 78.0–100.0)
MCV: 85.1 fL (ref 78.0–100.0)
Platelets: 399 10*3/uL (ref 150–400)
RBC: 2.73 MIL/uL — ABNORMAL LOW (ref 3.87–5.11)
WBC: 13.3 10*3/uL — ABNORMAL HIGH (ref 4.0–10.5)

## 2011-03-22 MED ORDER — OXYCODONE-ACETAMINOPHEN 5-325 MG PO TABS: 2.0000 | ORAL_TABLET | ORAL | Status: DC | PRN

## 2011-03-22 MED ORDER — OXYCODONE HCL 10 MG PO TABS: 10.0000 mg | ORAL_TABLET | ORAL | Status: AC | PRN

## 2011-03-22 MED ORDER — OXYCODONE HCL 5 MG PO TABS
10.0000 mg | ORAL_TABLET | ORAL | Status: DC | PRN
  Administered 2011-03-22: 10 mg via ORAL
  Filled 2011-03-22: qty 2

## 2011-03-22 MED ORDER — FAMOTIDINE 20 MG PO TABS: 20.0000 mg | ORAL_TABLET | Freq: Two times a day (BID) | ORAL | Status: DC

## 2011-03-22 MED ORDER — OXYCODONE-ACETAMINOPHEN 5-325 MG PO TABS: 1.0000 | ORAL_TABLET | Freq: Four times a day (QID) | ORAL | Status: AC | PRN

## 2011-03-22 MED ORDER — IBUPROFEN 600 MG PO TABS: 600.0000 mg | ORAL_TABLET | Freq: Three times a day (TID) | ORAL | Status: AC | PRN

## 2011-03-22 MED ORDER — BUPROPION HCL ER (XL) 150 MG PO TB24: 150.0000 mg | ORAL_TABLET | Freq: Every day | ORAL | Status: DC

## 2011-03-22 NOTE — Discharge Summary (Signed)
Physician Discharge Summary  Patient ID: [name] [id number] [birth year] 20 y.o.  Admit date: 03/19/2011 Discharge date: 03/22/2011  PCP: [name], MD of [facility]     Discharge Diagnosis: Primary 1. sickle cell pain crisis Secondary 1. Sickle Cell Anemia SS 2. GERD   Hospital Course 20 yo AA female with a history of sickle cell SS disease presents with pain crisis:  1.Vaso-occlusive crisis: Patient did not get her medications as prescribed from the most recent hospitalization. She was instructed on the importance of getting her pain medicines to prevent her from being hospitalized. Admitted and treated with Dilaudid IV initially. Patient was transitioned to scheduled ibuprofen and Percocet every 6 hours. She initially used Dilaudid for breakthrough pain. But on the day of discharge she was changed to oxycodone 10 mg for breakthrough pain in place of the Dilaudid.  -Continuous pulse ox. On admission, Chest x-ray was negative for acute cardiopulmonary disease. No cp/sob/increasing o2. Incentive spirometry for ppx acute chest.  -for  Nausea:  Phenergan 12.5 mg q 6 prn and Zofran 4 mg q 6 prn for nausea. Patient used sparingly. Initially didn't tolerate food due to pain but was eating a regular diet by time of discharge.  2. Anemia: Stable throughout patient's hospitalization at baseline of 8-9 hemoglobin. Reticulocyte count 19%.  3. Psych: continued home Wellbutrin.  4. GERD-Pepcid 20mg  per home meds    Procedures/Imaging:  Dg Pneumonia Chest 2v  03/19/2011  *RADIOLOGY REPORT*  Clinical Data: Chest pain.  Sickle cell disease.  CHEST - 2 VIEW  Comparison: 12/07/2010  Findings: The lungs are clear without focal infiltrate, edema, pneumothorax or pleural effusion.  Linear scarring in the right upper lobe is stable. The cardiopericardial silhouette is within normal limits for size.  Changes in the thoracolumbar spine are compatible with the reported history of  sickle cell disease.  IMPRESSION: No acute cardiopulmonary process.  Original Report Authenticated By: ERIC A. MANSELL, M.D.     Labs  CBC  Lab 03/22/11 0628 03/21/11 0938 03/20/11 2012  WBC 13.3* 14.5* 16.2*  HGB 7.9* 9.0* 9.1*  HCT 23.3* 26.3* 26.4*  PLT 399 512* 496*   BMET  Lab 03/20/11 0630 03/19/11 1719 03/15/11 1531  NA 137 137 139  K 3.6 3.7 3.6  CL 104 103 105  CO2 24 23 23   BUN 5* 5* 6  CREATININE 0.61 0.56 0.60  CALCIUM 9.3 9.2 9.4  PROT -- -- --  BILITOT -- -- --  ALKPHOS -- -- --  ALT -- -- --  AST -- -- --  GLUCOSE 84 98 92   Lab Results  Component Value Date   RETICCTPCT 19.0* 03/19/2011        Patient condition at time of discharge/disposition: stable  Disposition-home   Follow up issues: 1.  Social-patient with recent miscarriage. Plans on one day going to law school and realizes that having a child at this time could be a barrier to that. 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  Of note, counseled patient to have boyfriend tested for sickle cell trait as they have had a previous pregnancy and continue to be sexually active, so that they can discuss their options. Kimiye says she thinks it would be difficult for her to raise a child with sickle cell. Follow up for contraception outpatient.  2. Follow up pain control.   Discharge follow up:  Discharge Orders    Future Appointments: Provider: Department: Dept  Phone: Center:   04/05/2011 9:00 AM [name] [facility] Resident [phone] [facility]      Discharge Medications  Alenna, Russell  Home Medication Instructions AVW:[id number]   Printed on:03/22/11 1726  Medication Information                    polyethylene glycol powder (GLYCOLAX/MIRALAX) powder Take 17 g by mouth 2 (two) times daily as needed. For constipation           buPROPion (WELLBUTRIN XL) 150 MG 24 hr tablet Take 1 tablet (150 mg total) by mouth daily.           famotidine (PEPCID) 20 MG  tablet Take 1 tablet (20 mg total) by mouth 2 (two) times daily.           ibuprofen (ADVIL,MOTRIN) 600 MG tablet Take 1 tablet (600 mg total) by mouth every 8 (eight) hours as needed for pain.           oxyCODONE 10 MG TABS Take 1 tablet (10 mg total) by mouth every 4 (four) hours as needed (please use only if percocet not controlling pain. ).  25 given         oxyCODONE-acetaminophen (PERCOCET) 5-325 MG per tablet Take 1 tablet by mouth every 6 (six) hours as needed for pain.  25 given             Tana Conch, MD of [facility] 03/22/2011 10:24 AM

## 2011-03-22 NOTE — Progress Notes (Signed)
Discussed in rounds.  Pain improved and tolerating PO.  Again, I am delighted by her positive attitude.  I agree with DC today.

## 2011-03-22 NOTE — Discharge Summary (Signed)
Discussed in rounds and agree with DC today as outlined by Dr. Durene Cal.

## 2011-03-22 NOTE — Progress Notes (Signed)
  Family Medicine Teaching Service Intern Progress Note  Subjective: Left arm pain improved. Willing to transition to PO meds and attempt pain control at home today. Low back pain slightly worse but typical of her pain crisis.   Objective:  Temp:  [97.9 F (36.6 C)-98.9 F (37.2 C)] 97.9 F (36.6 C) (11/06 0443) Pulse Rate:  [89-109] 89  (11/06 0852) Resp:  [18] 18  (11/06 0443) BP: (102-154)/(50-81) 154/81 mmHg (11/06 0443) SpO2:  [95 %-99 %] 95 % (11/06 0443) Outlier blood pressure and HR overnight-rechecked this AM and not hypertensive or tachycardic.   General appearance: alert, cooperative. Very sleepy and not wanting to wake up for exam Resp: clear to auscultation bilaterally Cardio: regular rate and rhythm, S1, S2 normal, no murmur, click, rub or gallop GI: soft, non-tender; bowel sounds normal; no masses,  no organomegaly Extremities: Mild tenderness on palpation of left hand and shoulder.  Skin: Skin color, texture, turgor normal. No rashes or lesions  Labs and imaging:   CBC  Lab 03/22/11 1133 03/22/11 0628 03/21/11 0938  WBC 14.1* 13.3* 14.5*  HGB 8.1* 7.9* 9.0*  HCT 23.5* 23.3* 26.3*  PLT 455* 399 512*   Lab Results  Component Value Date   RETICCTPCT 19.0* 03/19/2011    BMET  Lab 03/20/11 0630 03/19/11 1719 03/15/11 1531  NA 137 137 139  K 3.6 3.7 3.6  CL 104 103 105  CO2 24 23 23   BUN 5* 5* 6  CREATININE 0.61 0.56 0.60  LABGLOM -- -- --  GLUCOSE 84 -- --  CALCIUM 9.3 9.2 9.4    No results found.   Assessment  20 yo AA female with a history of sickle cell SS disease presents with pain crisis:  1.Vaso-occlusive crisis:  -scheduled ibuprofen and percocet q6hours. Had been on dilaudid 0.5mg  q3 breakthrough pain. Patient got 4 doses. Will give oxycodone 10mg   for breakthrough on d/c in place of dilaudid.  -Continuous pulse ox.  On admission, Chest x-ray was negative for acute cardiopulmonary disease. No cp/sob/increasing o2. Incentive spirometry for  ppx acute chest.  2. Nausea: Continue Phenergan 12.5 mg q 6 prn and Zofran 4 mg q 6 prn for nausea. Has only used once recorded in 24 hours.  Patient tolerating solids. 3. Anemia: Hgb  At 7.9 down from 9.0 on AM labs. Stable on repeat at 8.1. Also reticulocyte % was 19%.   4. Psych: restarted home Wellbutrin.  5. GERD-Pepcid 20mg  per home meds  6. FEN/GI: regular diet. KVO IVF.  7. PPx: Heparin 5000 [place] TID. Incentive spirometry.  8. likely discharge this afternoon. Of note, counseled patient to have boyfriend tested for sickle cell trait as they have had a previous pregnancy and continue to be sexually active, so that they can discuss their options. Nevena says she thinks it would be difficult for her to raise a child with sickle cell. Follow up for contraception outpatient.   Tana Conch, MD PGY1, Family Medicine Teaching Service [phone]

## 2011-03-28 IMAGING — US US ABDOMEN COMPLETE
1 series · 13 of 25 positions shown · non-contrast
Comparison: None

CLINICAL DATA: Abdominal pain and vomiting.  History of sickle
cell disease

ABDOMINAL ULTRASOUND COMPLETE

[Series 1: us abdomen complete · 0.35mm/px · 13 of 72 slices shown]
[im 1/72]
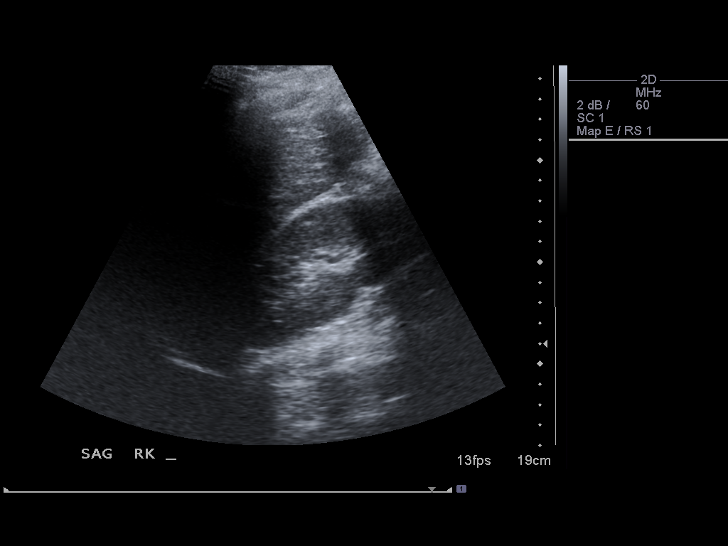
[im 6/72]
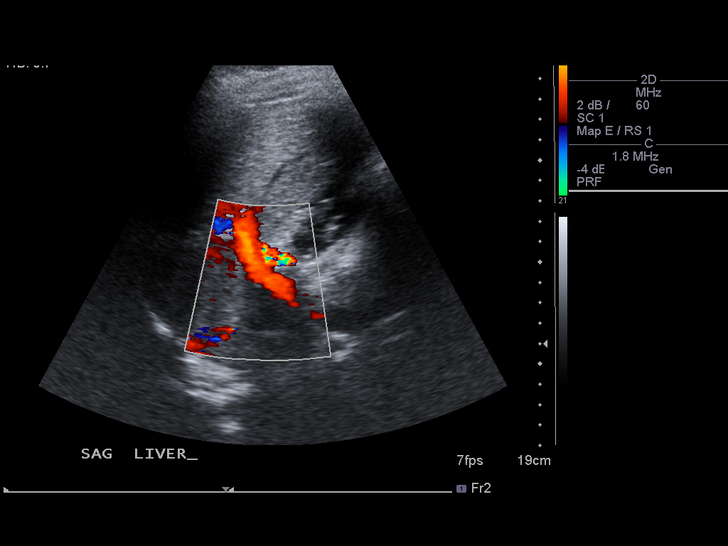
[im 12/72]
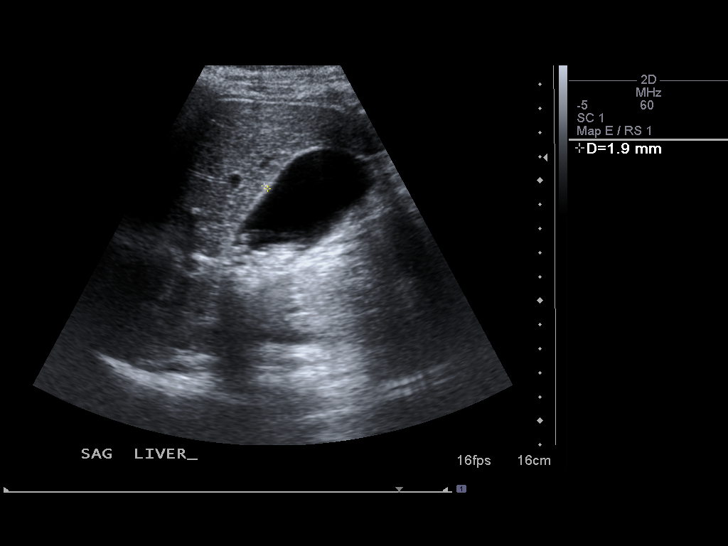
[im 18/72]
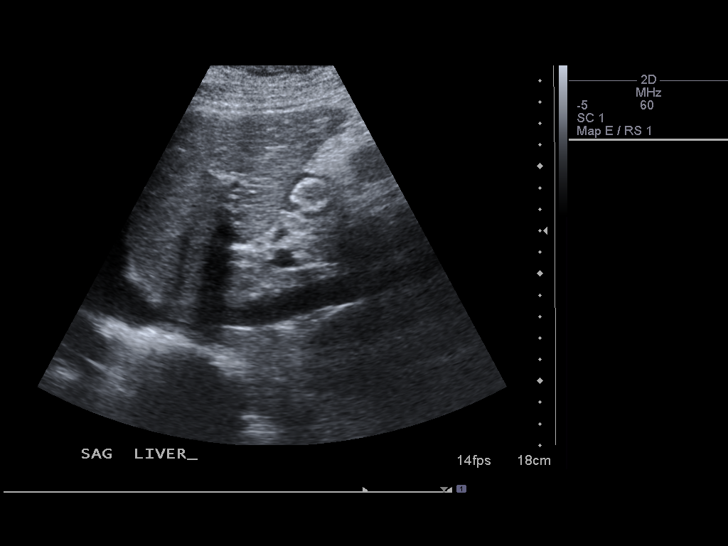
[im 24/72]
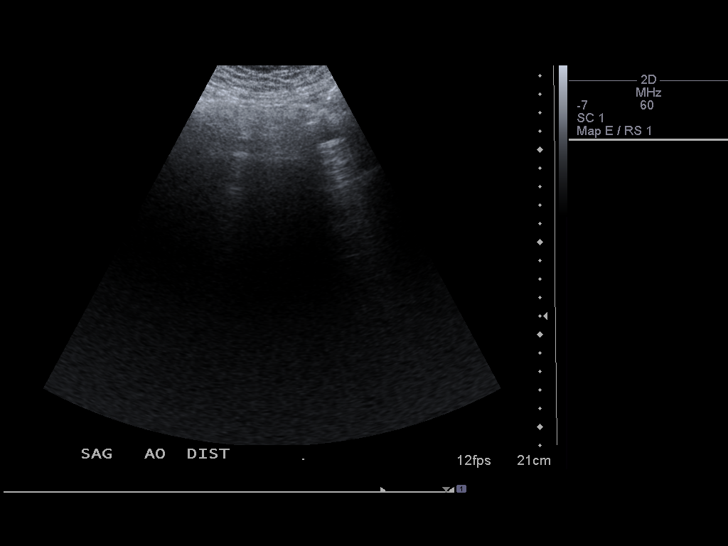
[im 30/72]
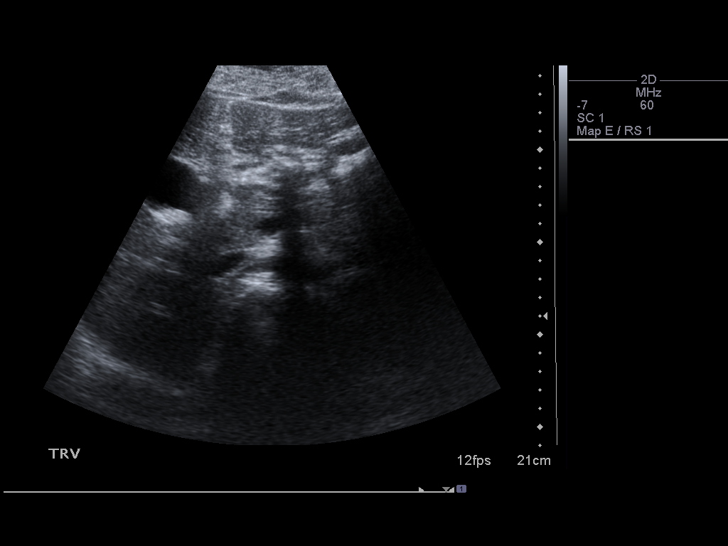
[im 36/72]
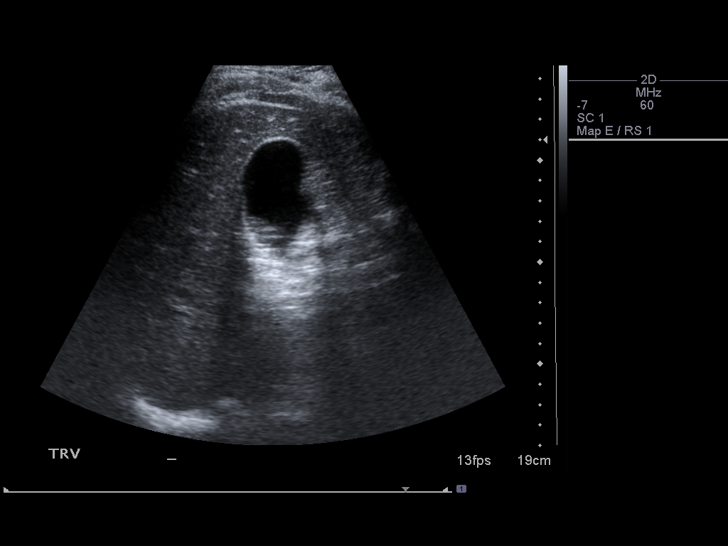
[im 42/72]
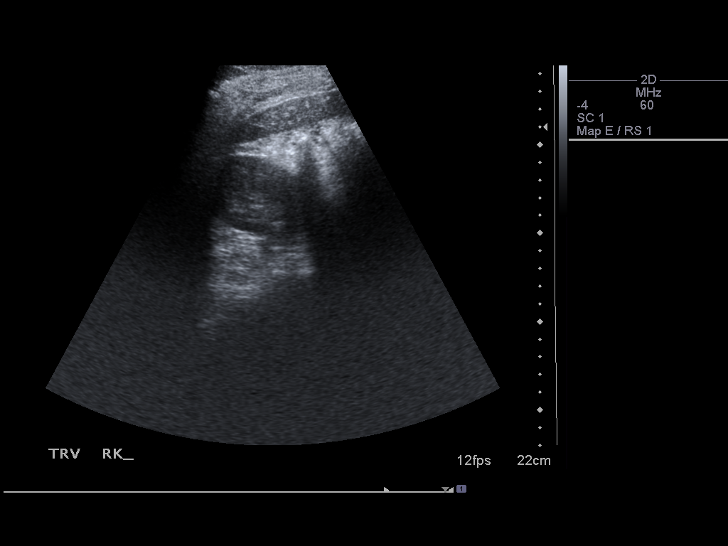
[im 48/72]
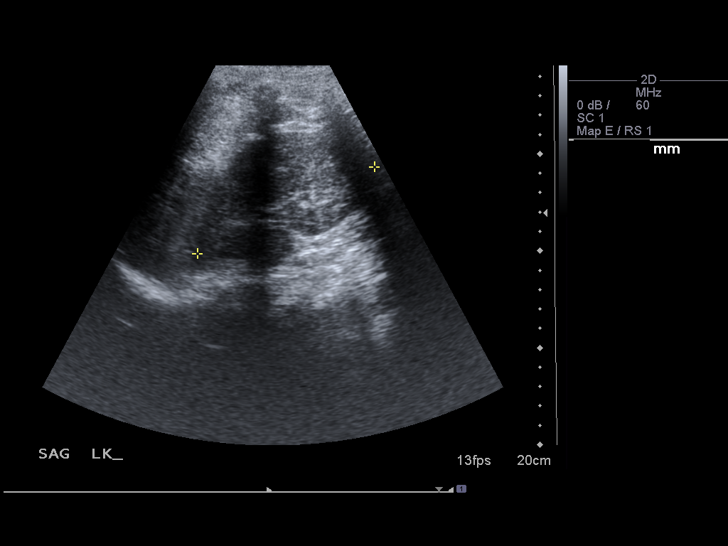
[im 54/72]
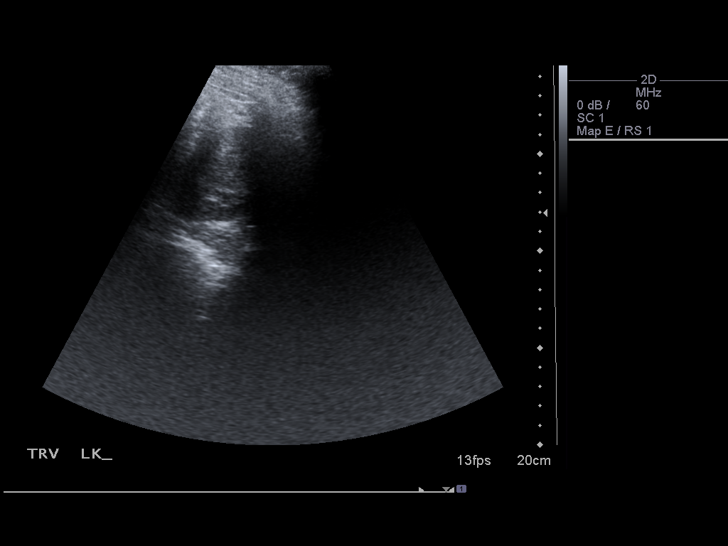
[im 60/72]
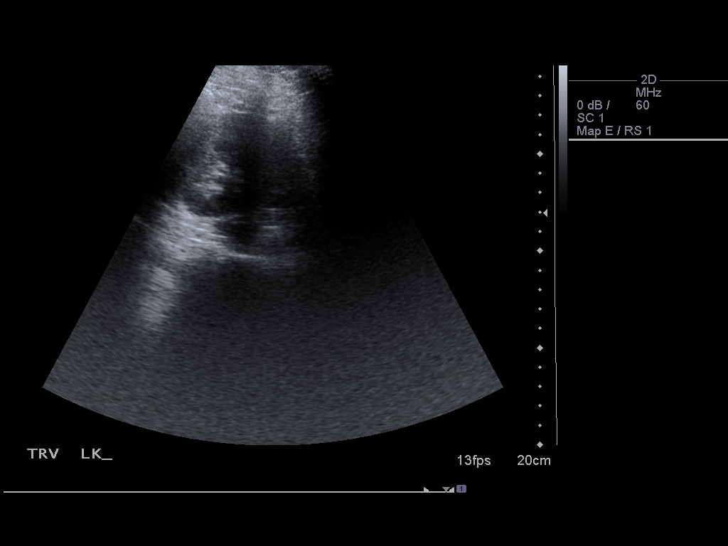
[im 66/72]
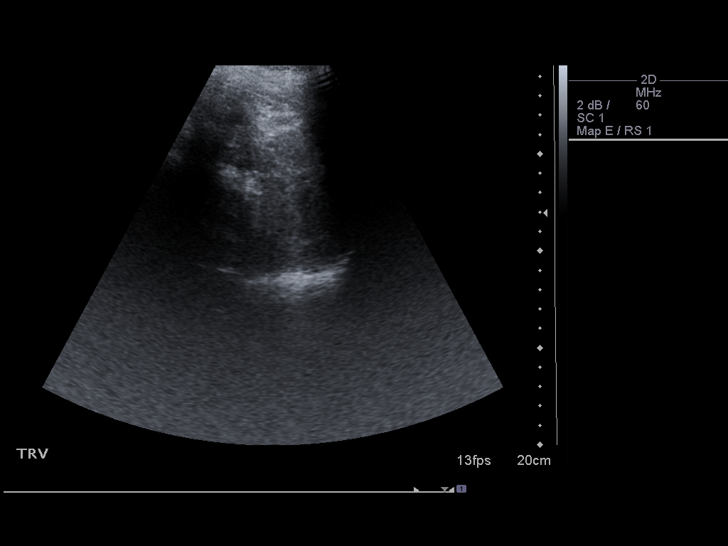
[im 72/72]
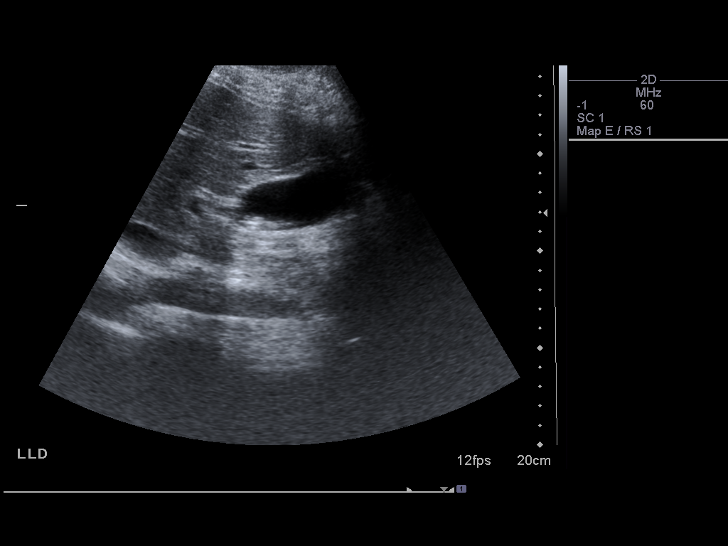

[13 of 25 positions shown; findings below may reference images not displayed]

FINDINGS: Gallbladder:  Multiple small mobile gallstones are identified.
There is no evidence of sonographic Murphy's sign, gallbladder wall
thickening, or pericholecystic fluid.

Common Bile Duct:  There is no evidence of intrahepatic or
extrahepatic biliary dilation. The CBD measures 5.1 mm in greatest
diameter.

Liver:  The liver is within normal limits in parenchymal
echogenicity. No focal abnormalities are identified.

IVC:  Appears normal.

Pancreas:  Although the pancreas is difficult to visualize in its
entirety, no focal pancreatic abnormality is identified.

Spleen:  The spleen is increased in echogenicity and may represent
auto infarction.

Right kidney:  The right kidney is normal in size and parenchymal
echogenicity.  There is no evidence of solid mass, hydronephrosis
or definite renal calculi.  The right kidney measures 11.2 cm.

Left kidney:  The left kidney is normal in size and parenchymal
echogenicity.  There is no evidence of solid mass, hydronephrosis
or definite renal calculi.   The left kidney measures 11.1 cm.

Abdominal Aorta:  No abdominal aortic aneurysm identified.

There is no evidence of ascites.
IMPRESSION: Cholelithiasis without evidence of acute cholecystitis
or biliary dilatation.

Heterogeneous increased echogenicity of the spleen which may
represent auto-infarction

## 2011-03-29 ENCOUNTER — Encounter (HOSPITAL_BASED_OUTPATIENT_CLINIC_OR_DEPARTMENT_OTHER): Payer: Self-pay | Admitting: *Deleted

## 2011-03-29 ENCOUNTER — Inpatient Hospital Stay (HOSPITAL_BASED_OUTPATIENT_CLINIC_OR_DEPARTMENT_OTHER)
Admission: EM | Admit: 2011-03-29 | Discharge: 2011-04-02 | DRG: 812 | Disposition: A | Discharge status: Home / Self Care | Payer: Medicaid Other | Source: Ambulatory Visit | Attending: Family Medicine | Admitting: Family Medicine

## 2011-03-29 ENCOUNTER — Emergency Department (INDEPENDENT_AMBULATORY_CARE_PROVIDER_SITE_OTHER): Payer: Medicaid Other

## 2011-03-29 DIAGNOSIS — D5700 Hb-SS disease with crisis, unspecified: Secondary | ICD-10-CM

## 2011-03-29 DIAGNOSIS — Z79899 Other long term (current) drug therapy: Secondary | ICD-10-CM

## 2011-03-29 DIAGNOSIS — F329 Major depressive disorder, single episode, unspecified: Secondary | ICD-10-CM | POA: Diagnosis present

## 2011-03-29 DIAGNOSIS — R079 Chest pain, unspecified: Secondary | ICD-10-CM

## 2011-03-29 DIAGNOSIS — K219 Gastro-esophageal reflux disease without esophagitis: Secondary | ICD-10-CM | POA: Diagnosis present

## 2011-03-29 DIAGNOSIS — Z87891 Personal history of nicotine dependence: Secondary | ICD-10-CM

## 2011-03-29 DIAGNOSIS — F3289 Other specified depressive episodes: Secondary | ICD-10-CM | POA: Diagnosis present

## 2011-03-29 LAB — BASIC METABOLIC PANEL
CO2: 24 mEq/L (ref 19–32)
Chloride: 105 mEq/L (ref 96–112)
GFR calc Af Amer: >90 mL/min (ref >90)
Potassium: 4.1 mEq/L (ref 3.5–5.1)

## 2011-03-29 LAB — CBC
HCT: 26.3 % — ABNORMAL LOW (ref 36.0–46.0)
Hemoglobin: 9.2 g/dL — ABNORMAL LOW (ref 12.0–15.0)
MCV: 84.8 fL (ref 78.0–100.0)
WBC: 16.4 10*3/uL — ABNORMAL HIGH (ref 4.0–10.5)

## 2011-03-29 LAB — PREGNANCY, URINE: Preg Test, Ur: NEGATIVE

## 2011-03-29 LAB — URINALYSIS, ROUTINE W REFLEX MICROSCOPIC
Glucose, UA: NEGATIVE mg/dL
Hgb urine dipstick: NEGATIVE
Specific Gravity, Urine: 1.013 (ref 1.005–1.030)
pH: 7.5 (ref 5.0–8.0)

## 2011-03-29 MED ORDER — SODIUM CHLORIDE 0.9 % IV BOLUS (SEPSIS)
1000.0000 mL | Freq: Once | INTRAVENOUS | Status: AC
  Administered 2011-03-29: 1000 mL via INTRAVENOUS

## 2011-03-29 MED ORDER — HYDROMORPHONE HCL PF 2 MG/ML IJ SOLN
2.0000 mg | INTRAMUSCULAR | Status: DC | PRN
  Administered 2011-03-29 – 2011-03-30 (×2): 2 mg via INTRAVENOUS
  Filled 2011-03-29 (×2): qty 1

## 2011-03-29 MED ORDER — ACETAMINOPHEN 325 MG PO TABS
975.0000 mg | ORAL_TABLET | Freq: Four times a day (QID) | ORAL | Status: DC | PRN
  Administered 2011-03-29: 975 mg via ORAL
  Filled 2011-03-29: qty 3

## 2011-03-29 NOTE — ED Notes (Signed)
Pt arrived to ED without indwelling IV.  IV not documented as removed from previous visit.

## 2011-03-29 NOTE — ED Notes (Addendum)
Oxygen in place. IVF bolus liter started.

## 2011-03-29 NOTE — ED Notes (Signed)
Dr. Campos at bedside   

## 2011-03-29 NOTE — ED Notes (Signed)
Pt presents today with sickle cell pain crisis.  Pt has hx of same.  Pt last took oxycodone aroun 6pm

## 2011-03-29 NOTE — ED Notes (Signed)
Pt placed in wheelchair and escorted to bathroom. Instructed to provide a urine specimen.

## 2011-03-29 NOTE — ED Notes (Addendum)
Pt states "i want a pregnancy test done. im trying to get pregnant, and i had a three day period last weekend, but im pretty sure im pregnant because my stomach hurts". MD made aware. No new orders received. Pt's urine in lab at this time.

## 2011-03-30 ENCOUNTER — Encounter (HOSPITAL_COMMUNITY): Payer: Self-pay | Admitting: *Deleted

## 2011-03-30 LAB — CBC
HCT: 25.2 % — ABNORMAL LOW (ref 36.0–46.0)
Hemoglobin: 8.6 g/dL — ABNORMAL LOW (ref 12.0–15.0)
RBC: 2.94 MIL/uL — ABNORMAL LOW (ref 3.87–5.11)

## 2011-03-30 LAB — CREATININE, SERUM
Creatinine, Ser: 0.55 mg/dL (ref 0.50–1.10)
GFR calc Af Amer: >90 mL/min (ref >90)
GFR calc non Af Amer: >90 mL/min (ref >90)

## 2011-03-30 LAB — RETICULOCYTES
RBC.: 3.07 MIL/uL — ABNORMAL LOW (ref 3.87–5.11)
Retic Count, Absolute: 549.5 10*3/uL — ABNORMAL HIGH (ref 19.0–186.0)

## 2011-03-30 IMAGING — RF DG CHOLANGIOGRAM OPERATIVE
1 series · 4 of 4 positions shown · non-contrast
Comparison: 05/13/2010 ultrasound.

CLINICAL DATA: Cholelithiasis.

INTRAOPERATIVE CHOLANGIOGRAM
TECHNIQUE: Cholangiographic images from the C-arm fluoroscopic
device were submitted for interpretation post-operatively.  Please
see the procedural report for the amount of contrast and the
fluoroscopy time utilized.

[Series 1: run · 4 of 22 frames shown]
[frame 4/22]
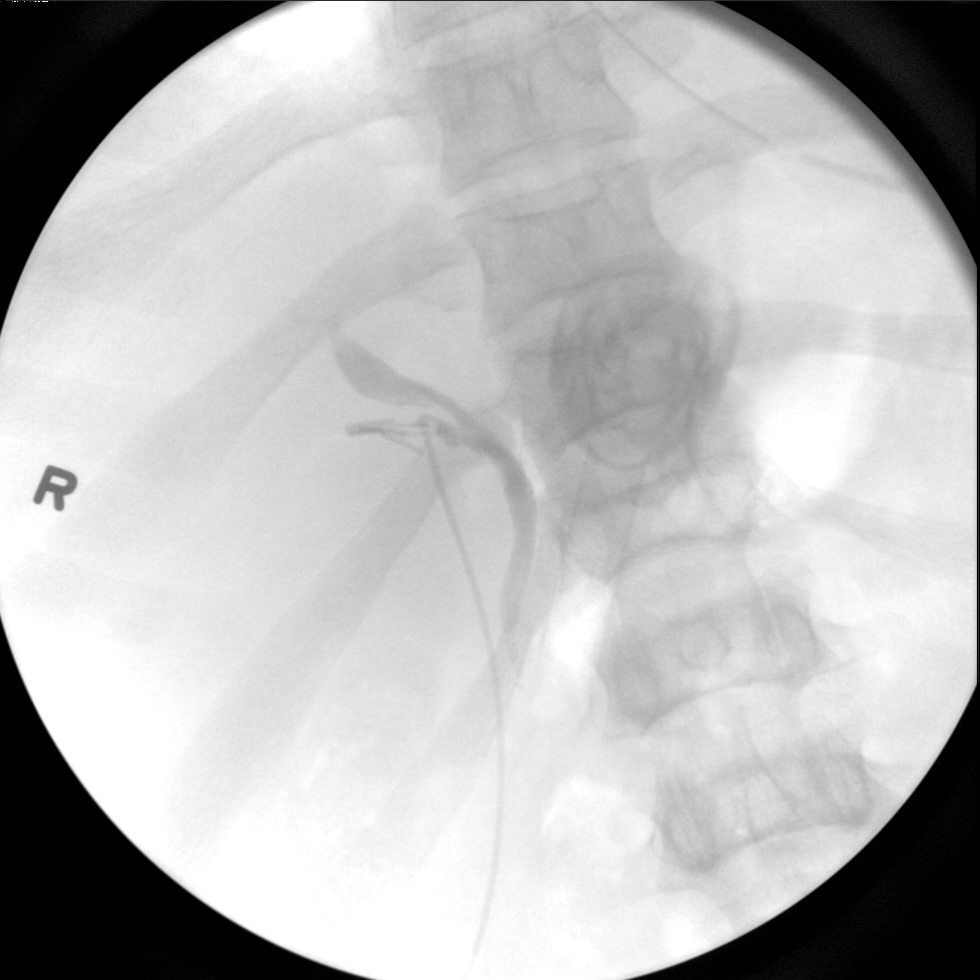
[frame 12/22]
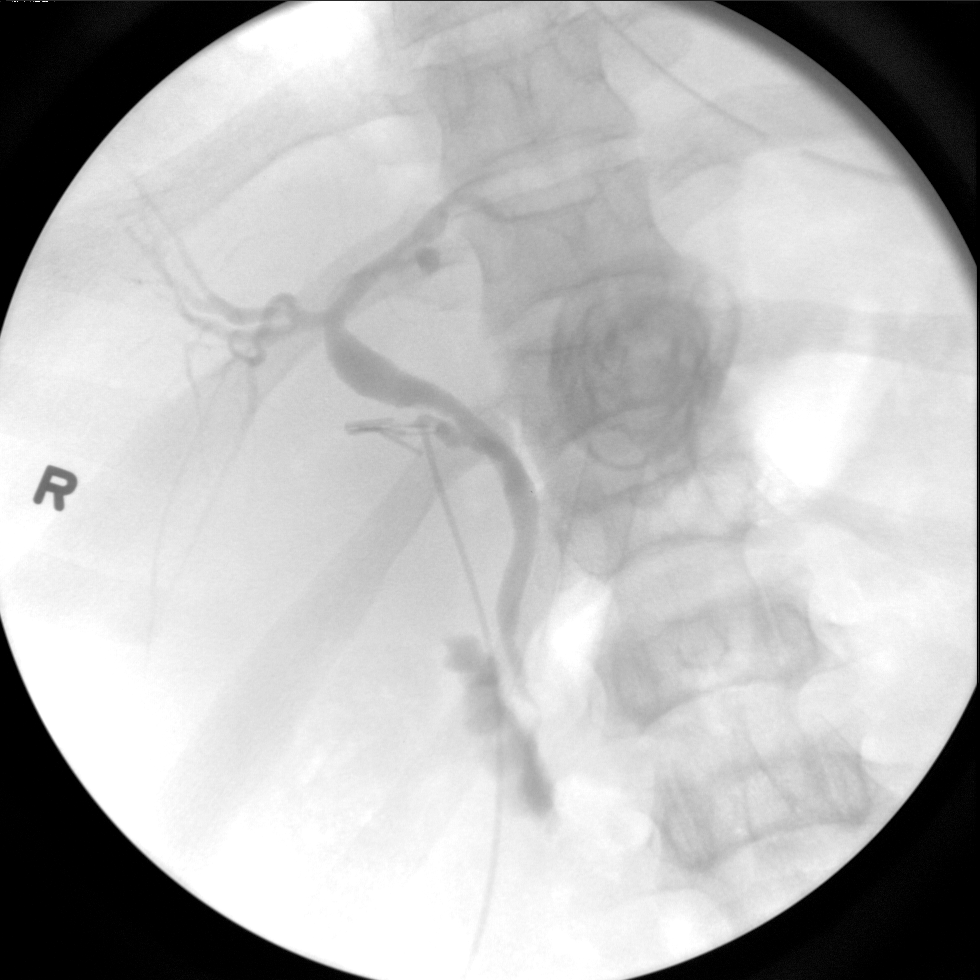
[frame 19/22]
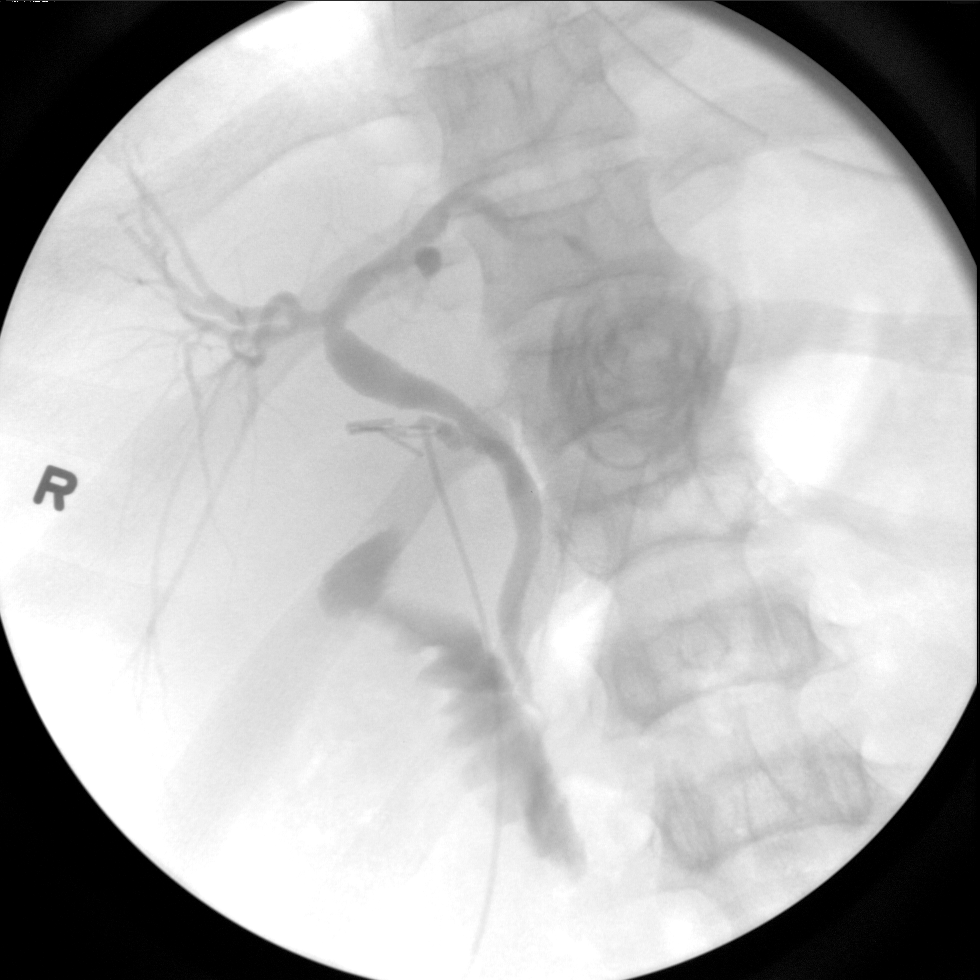
[frame 20/22]
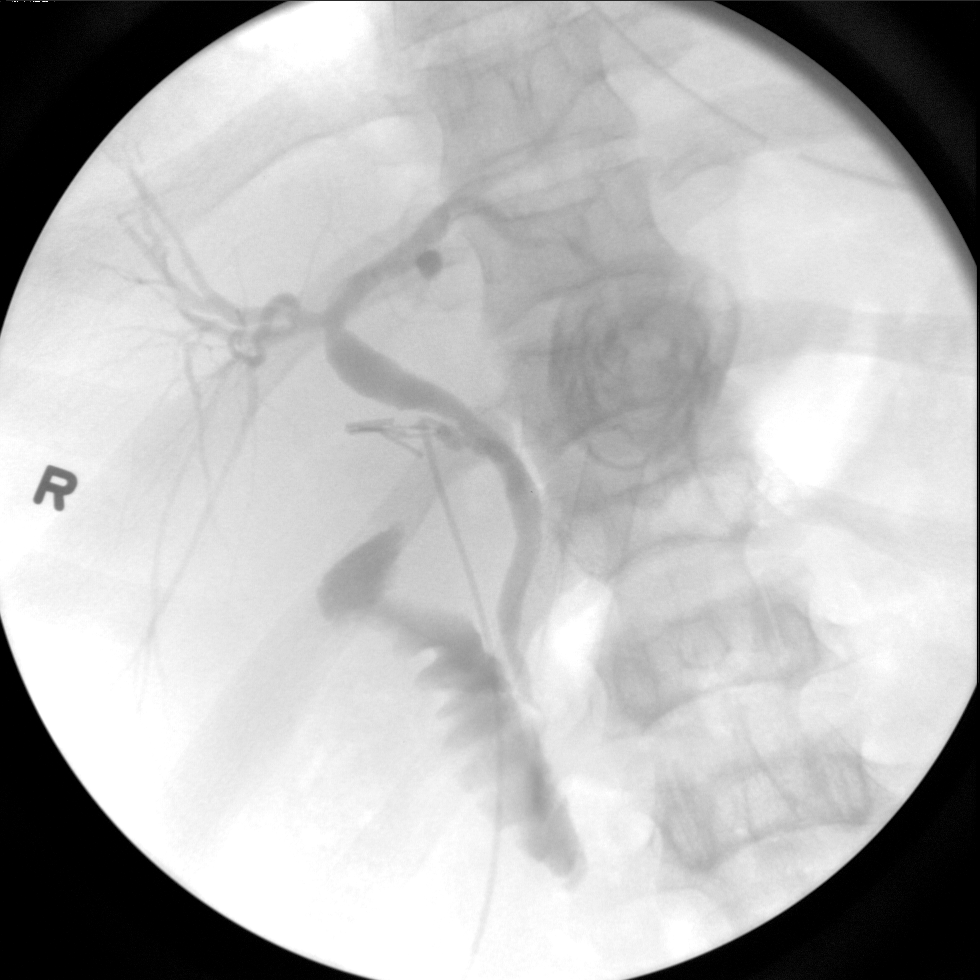

[4 of 4 positions shown; findings below may reference images not displayed]

FINDINGS: Single run of 22 images.  Demonstrates contrast
injection into the cystic duct without contrast extravasation.
Note is made of focal contrast opacification within the left
hepatic lobe on image 16 of series one.  Likely an area of focal
biliary dilatation.

No persistent filling defects within the common duct.  Free spill
of contrast into the second portion of the duodenum.
IMPRESSION: No evidence choledocholithiasis or contrast extravasation.

## 2011-03-30 MED ORDER — ONDANSETRON HCL 4 MG/2ML IJ SOLN
4.0000 mg | Freq: Four times a day (QID) | INTRAMUSCULAR | Status: DC | PRN
  Administered 2011-03-30 – 2011-03-31 (×2): 4 mg via INTRAVENOUS
  Filled 2011-03-30 (×2): qty 2

## 2011-03-30 MED ORDER — HYDROMORPHONE 0.3 MG/ML IV SOLN
INTRAVENOUS | Status: DC
  Administered 2011-03-30: 0.3 mg via INTRAVENOUS
  Administered 2011-03-30: 0.39 mg via INTRAVENOUS
  Administered 2011-03-30: 7.5 mg via INTRAVENOUS
  Administered 2011-03-30 (×2): 1.2 mg via INTRAVENOUS
  Administered 2011-03-31: 7.5 mg via INTRAVENOUS
  Administered 2011-03-31: 0.6 mg via INTRAVENOUS
  Administered 2011-03-31: 3 mL via INTRAVENOUS
  Administered 2011-03-31: 1.2 mg via INTRAVENOUS
  Administered 2011-03-31: 1.5 mg via INTRAVENOUS
  Administered 2011-03-31: 1.2 mg via INTRAVENOUS
  Administered 2011-04-01: 0.2 mg via INTRAVENOUS
  Administered 2011-04-01: 1.2 mg via INTRAVENOUS
  Administered 2011-04-01 (×2): 7.5 mg via INTRAVENOUS
  Administered 2011-04-01: 0.9 mg via INTRAVENOUS
  Administered 2011-04-01: 8 mg via INTRAVENOUS
  Administered 2011-04-01: 1.5 mg via INTRAVENOUS
  Administered 2011-04-01: 0.6 mg via INTRAVENOUS
  Administered 2011-04-01: 1.79 mg via INTRAVENOUS
  Administered 2011-04-02: 0.6 mg via INTRAVENOUS
  Administered 2011-04-02: 1.5 mg via INTRAVENOUS
  Filled 2011-03-30 (×2): qty 25

## 2011-03-30 MED ORDER — IBUPROFEN 600 MG PO TABS
600.0000 mg | ORAL_TABLET | Freq: Four times a day (QID) | ORAL | Status: DC
  Administered 2011-03-30 – 2011-04-02 (×15): 600 mg via ORAL
  Filled 2011-03-30 (×17): qty 1

## 2011-03-30 MED ORDER — OXYCODONE-ACETAMINOPHEN 5-325 MG PO TABS
1.0000 | ORAL_TABLET | ORAL | Status: DC | PRN
  Administered 2011-03-30: 1 via ORAL

## 2011-03-30 MED ORDER — NALOXONE HCL 0.4 MG/ML IJ SOLN: 0.4000 mg | INTRAMUSCULAR | Status: DC | PRN

## 2011-03-30 MED ORDER — HEPARIN SODIUM (PORCINE) 5000 UNIT/ML IJ SOLN
5000.0000 [IU] | Freq: Three times a day (TID) | INTRAMUSCULAR | Status: DC
  Administered 2011-03-30 – 2011-04-02 (×4): 5000 [IU] via SUBCUTANEOUS
  Filled 2011-03-30 (×13): qty 1

## 2011-03-30 MED ORDER — BUPROPION HCL ER (XL) 150 MG PO TB24
150.0000 mg | ORAL_TABLET | Freq: Every day | ORAL | Status: DC
  Administered 2011-03-30 – 2011-04-02 (×4): 150 mg via ORAL
  Filled 2011-03-30 (×4): qty 1

## 2011-03-30 MED ORDER — PANTOPRAZOLE SODIUM 40 MG IV SOLR
40.0000 mg | Freq: Every day | INTRAVENOUS | Status: DC
  Administered 2011-03-30 – 2011-03-31 (×2): 40 mg via INTRAVENOUS
  Filled 2011-03-30 (×3): qty 40

## 2011-03-30 MED ORDER — HYDROMORPHONE HCL PF 2 MG/ML IJ SOLN
2.0000 mg | Freq: Once | INTRAMUSCULAR | Status: AC
  Administered 2011-03-30: 2 mg via INTRAVENOUS
  Filled 2011-03-30: qty 1

## 2011-03-30 MED ORDER — DIPHENHYDRAMINE HCL 12.5 MG/5ML PO ELIX
12.5000 mg | ORAL_SOLUTION | Freq: Four times a day (QID) | ORAL | Status: DC | PRN
  Filled 2011-03-30: qty 5

## 2011-03-30 MED ORDER — SODIUM CHLORIDE 0.9 % IJ SOLN: 9.0000 mL | INTRAMUSCULAR | Status: DC | PRN

## 2011-03-30 MED ORDER — HYDROMORPHONE HCL PF 1 MG/ML IJ SOLN
1.0000 mg | Freq: Once | INTRAMUSCULAR | Status: AC
  Administered 2011-03-30: 1 mg via INTRAVENOUS
  Filled 2011-03-30: qty 1

## 2011-03-30 MED ORDER — DIPHENHYDRAMINE HCL 50 MG/ML IJ SOLN
25.0000 mg | Freq: Once | INTRAMUSCULAR | Status: AC
  Administered 2011-03-30: 25 mg via INTRAVENOUS
  Filled 2011-03-30: qty 1

## 2011-03-30 MED ORDER — POTASSIUM CHLORIDE IN NACL 20-0.9 MEQ/L-% IV SOLN
INTRAVENOUS | Status: DC
  Administered 2011-03-30 – 2011-03-31 (×3): via INTRAVENOUS
  Filled 2011-03-30 (×4): qty 1000

## 2011-03-30 MED ORDER — DIPHENHYDRAMINE HCL 50 MG/ML IJ SOLN
12.5000 mg | Freq: Four times a day (QID) | INTRAMUSCULAR | Status: DC | PRN
  Administered 2011-03-31: 12.5 mg via INTRAVENOUS
  Filled 2011-03-30: qty 1

## 2011-03-30 MED ORDER — ACETAMINOPHEN 325 MG PO TABS
650.0000 mg | ORAL_TABLET | Freq: Four times a day (QID) | ORAL | Status: DC
  Administered 2011-03-30 – 2011-04-02 (×13): 650 mg via ORAL
  Administered 2011-04-02: 325 mg via ORAL
  Filled 2011-03-30: qty 1
  Filled 2011-03-30 (×13): qty 2

## 2011-03-30 MED ORDER — OXYCODONE-ACETAMINOPHEN 5-325 MG PO TABS
ORAL_TABLET | ORAL | Status: AC
  Administered 2011-03-30: 1 via ORAL
  Filled 2011-03-30: qty 1

## 2011-03-30 MED ORDER — OXYCODONE HCL 5 MG PO TABS
10.0000 mg | ORAL_TABLET | ORAL | Status: DC | PRN
  Filled 2011-03-30: qty 2

## 2011-03-30 NOTE — ED Provider Notes (Signed)
History     CSN: [id number] Arrival date & time: 03/29/2011 10:16 PM   First MD Initiated Contact with Patient 03/29/11 2306      Chief Complaint  Patient presents with  . Sickle Cell Pain Crisis    (Consider location/radiation/quality/duration/timing/severity/associated sxs/prior treatment) HPI Patient reports development of her typical sickle cell pain crisis pain.  She reports his pain is in her back as well as her left shoulder.  She's taken her pain medicine at home which includes oxycodone Percocet as well as OxyContin without relief.  She reports nothing else improves her pain.  She reports nothing worsens her pain.  She has had recent miscarriage and also has had several recent admissions for sickle cell pain crisis.  She denies fever chills.  She denies chest pain or shortness of breath.  She denies nausea vomiting diarrhea.  She denies urinary frequency and dysuria.  She denies vaginal discharge or vaginal bleeding.  She has no abdominal pain at this time  Past Medical History  Diagnosis Date  . Sickle cell anemia with crisis   . Sickle cell anemia   . H/O: 1 miscarriage 03/22/2011  . SICKLE CELL ANEMIA 01/08/2009  . 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 Active smoker 08/09/2010  . Depression 01/06/2011  . GERD (gastroesophageal reflux disease) 02/17/2011    Past Surgical History  Procedure Date  . Cholecystectomy   . Choley     History reviewed. No pertinent family history.  History  Substance Use Topics  . Smoking status: Former Smoker    Types: Cigars  . Smokeless tobacco: Never Used  . Alcohol Use: No    OB History    Grav Para Term Preterm Abortions TAB SAB Ect Mult Living   1               Review of Systems  All other systems reviewed and are negative.    Allergies  Toradol  Home Medications   Current Outpatient Rx  Name Route Sig Dispense Refill  . BUPROPION HCL ER (XL) 150 MG PO TB24 Oral Take 1 tablet (150 mg total) by mouth daily. 30 tablet 1  .  FAMOTIDINE 20 MG PO TABS Oral Take 1 tablet (20 mg total) by mouth 2 (two) times daily. 30 tablet 1  . IBUPROFEN 600 MG PO TABS Oral Take 1 tablet (600 mg total) by mouth every 8 (eight) hours as needed for pain. 30 tablet 0  . OXYCODONE HCL 10 MG PO TABS Oral Take 1 tablet (10 mg total) by mouth every 4 (four) hours as needed (please use only if percocet not controlling pain. ). 25 tablet 0  . OXYCODONE-ACETAMINOPHEN 5-325 MG PO TABS Oral Take 1 tablet by mouth every 6 (six) hours as needed for pain. 25 tablet 0  . POLYETHYLENE GLYCOL 3350 PO POWD Oral Take 17 g by mouth 2 (two) times daily as needed. For constipation      BP 114/79  Pulse 100  Temp(Src) 98.3 F (36.8 C) (Oral)  Resp 18  SpO2 100%  LMP 03/26/2011  Physical Exam  Nursing note and vitals reviewed. Constitutional: She is oriented to person, place, and time. She appears well-developed and well-nourished. No distress.  HENT:  Head: Normocephalic and atraumatic.  Eyes: EOM are normal.  Neck: Normal range of motion.  Cardiovascular: Normal rate, regular rhythm and normal heart sounds.   Pulmonary/Chest: Effort normal and breath sounds normal.  Abdominal: Soft. She exhibits no distension. There is no tenderness.  Musculoskeletal: Normal range  of motion.  Neurological: She is alert and oriented to person, place, and time.  Skin: Skin is warm and dry.  Psychiatric: She has a normal mood and affect. Judgment normal.    ED Course  Procedures (including critical care time)    OBSERVATION Placed in Obs Time: 2315 Sickle cell Obs protocol PRN dilaudid and percocet and ongoing symptomatic treatment End Obs Time. Response to Observational treatment:      Labs Reviewed  RETICULOCYTES - Abnormal; Notable for the following:    Retic Ct Pct 17.9 (*)    RBC. 3.07 (*)    Retic Count, Manual 549.5 (*)    All other components within normal limits  CBC - Abnormal; Notable for the following:    WBC 16.4 (*)    RBC 3.10  (*)    Hemoglobin 9.2 (*)    HCT 26.3 (*)    RDW 15.6 (*)    Platelets 540 (*)    All other components within normal limits  BASIC METABOLIC PANEL  URINALYSIS, ROUTINE W REFLEX MICROSCOPIC  PREGNANCY, URINE   Dg Chest 2 View  03/29/2011  *RADIOLOGY REPORT*  Clinical Data: Chest pain; sickle cell crisis.  CHEST - 2 VIEW  Comparison: Chest radiograph performed 03/19/2011  Findings: The lungs are well-aerated.  Minimal scarring is noted at the right upper lung zone.  There is no evidence of focal opacification, pleural effusion or pneumothorax.  The heart is normal in size; the mediastinal contour is within normal limits.  No acute osseous abnormalities are seen.  Mild sclerotic change is noted at the humeral heads bilaterally.  Clips are noted within the right upper quadrant, reflecting prior cholecystectomy.  IMPRESSION: No acute cardiopulmonary process seen.  Original Report Authenticated By: Tonia Ghent, M.D.   i personally reviewed the cxr  1. Sickle-cell disease with vaso-occlusive pain       MDM  Placed in sickle cell observation status.  Will tx symptomatically at this time. Dilaudid q2hrs PRN and percocet PRN. Acetaminophen given.   12:59 AM Difficult to control pain. Will admit for pain control  1:09 AM Spoke with [facility] resident who accepts the pt in transfer to [facility]  Lyanne Co, MD 03/30/11 [phone]

## 2011-03-30 NOTE — H&P (Signed)
Family Medicine Teaching [facility] Admission History and Physical  Patient name: SHAQUAN PUERTA Medical record number: [id number] Date of birth: Sep 03, 1990 Age: 20 y.o. 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  She was recently hospitalized for a pain crisis and discharged on 03/19/11 with percocet and oxycodone.  She states she was doing well until yesterday when she stated having her typical sickle cell pain in her arms, worse on the left.  She took percocet x1 yesterday afternoon; however the pain return and spread to her shoulders and mid-back and oxycodone x2 did not relieve her pain.  She states that the weather triggered this crisis.  States has been taking good fluids.  Denies fever, chills, cough, n/v/d, urinary symptoms.  Does complain of some anterior chest pain that has resolved on its own.  At [facility], she received a 1L NS bolus as well as a total of 6mg  IV dilaudid.  A CXR was normal.  A UA was normal and a urine pregnancy test was negative.  Patient Active Problem List  Diagnoses  . SICKLE CELL ANEMIA  . TRICHOTILLOMANIA  . DRY SKIN  . Active smoker  . Back pain  . Depression  . GERD (gastroesophageal reflux disease)  . Contraception management  . Sickle-cell disease with vaso-occlusive pain   Past Medical History: Past Medical History  Diagnosis Date  . Sickle cell anemia with crisis   . Sickle cell anemia   . H/O: 1 miscarriage 03/22/2011  . SICKLE CELL ANEMIA 01/08/2009  . 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 Active smoker 08/09/2010  . Depression 01/06/2011  . GERD (gastroesophageal reflux disease) 02/17/2011  . Sickle cell anemia     pf has frequent sickle cell crisis  . Sickle cell anemia     Past Surgical History: Past Surgical History  Procedure  Date  . Cholecystectomy   . Choley    No current facility-administered medications on file prior to encounter.   Current Outpatient Prescriptions on File Prior to Encounter  Medication Sig Dispense Refill  . buPROPion (WELLBUTRIN XL) 150 MG 24 hr tablet Take 1 tablet (150 mg total) by mouth daily.  30 tablet  1  . famotidine (PEPCID) 20 MG tablet Take 1 tablet (20 mg total) by mouth 2 (two) times daily.  30 tablet  1  . ibuprofen (ADVIL,MOTRIN) 600 MG tablet Take 1 tablet (600 mg total) by mouth every 8 (eight) hours as needed for pain.  30 tablet  0  . oxyCODONE 10 MG TABS Take 1 tablet (10 mg total) by mouth every 4 (four) hours as needed (please use only if percocet not controlling pain. ).  25 tablet  0  . oxyCODONE-acetaminophen (PERCOCET) 5-325 MG per tablet Take 1 tablet by mouth every 6 (six) hours as needed for pain.  25 tablet  0  . polyethylene glycol powder (GLYCOLAX/MIRALAX) powder Take 17 g by mouth 2 (two) times daily as needed. For constipation       Social History: Lives at home with mother and boyfriend.  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  She and boyfriend are trying to get pregnant; recent miscarriage the beginning of October.  Quit smoking 1 week ago.  No alcohol or drug use.  Family History: HTN and DM on mom's side  Allergies: Allergies  Allergen Reactions  . Toradol Hives and Itching    Some itching, give with benedryl     Review Of Systems: Per HPI with the following additions: none Otherwise 12 point review of systems was performed and was unremarkable.  Physical Exam: Pulse: 84  Blood Pressure: 104/56 RR: 20   O2: 100 on 2L Temp: 97.6  General: cooperative, appears stated age, no distress and dozing off during history and exam HEENT: PERRLA, sclera clear, anicteric and pupils constricted at 2mm, but reactive to light; lips appear dry and cracked Heart: S1, S2 normal, no murmur, rub or gallop, regular rate and rhythm Lungs:  clear to auscultation, no wheezes or rales and unlabored breathing Abdomen: abdomen is soft without significant tenderness, masses, organomegaly or guarding Extremities: extremities normal, atraumatic, no cyanosis or edema Skin:no rashes Neurology: normal without focal findings, mental status, speech normal, alert and oriented x3 and PERLA  Labs and Imaging: Lab Results  Component Value Date/Time   NA 140 03/29/2011 10:38 PM   K 4.1 03/29/2011 10:38 PM   CL 105 03/29/2011 10:38 PM   CO2 24 03/29/2011 10:38 PM   BUN 9 03/29/2011 10:38 PM   CREATININE 0.60 03/29/2011 10:38 PM   GLUCOSE 87 03/29/2011 10:38 PM   Lab Results  Component Value Date   WBC 16.4* 03/29/2011   HGB 9.2* 03/29/2011   HCT 26.3* 03/29/2011   MCV 84.8 03/29/2011   PLT 540* 03/29/2011   Retic: 17.9%  CXR: no acute processes  UA: normal Upreg: negative  Assessment and Plan: VITA CURRIN is a 20 y.o. year old female presenting with sickle cell pain crisis  1. Sickle cell pain crisis: No concern for acute chest with normal CXR and no respiratory symptoms.  Will start a dilaudid PCA as well as scheduled ibuprofen.  Will also give IVF of NS + 20KCl at 100cc/hr and provide oxygen. -Dilaudid PCA -Ibuprofen 600mg  q6hr scheduled -NS+20KCl at 100cc/hr -supplemental O2 2. Depression: stable on home meds.  Will continue wellbutrin 3. History of tobacco abuse: Quit 1 week ago; provide encouragement and support  FEN/GI: clear liquid diet, advance as tolerate; NS+20KCl at 100cc/hr Prophylaxis: heparin SQ 5000 units TID; protonix IV 40mg  Disposition: pending clinical improvement  BOOTH, Kollins Fenter Pager: [phone] 03/30/2011, 6:17 AM

## 2011-03-30 NOTE — ED Notes (Signed)
Pt assisted to restroom.  

## 2011-03-30 NOTE — ED Notes (Addendum)
Pt remains in bathroom. States she feels constipated. Boyfriend at side.

## 2011-03-30 NOTE — H&P (Signed)
I have seen and examined [name].  I have discussed with Dr Elwyn Reach.  I agree with their findings and plans as documented in their admit note. Acute Issues 1. Vaso-occlusive pain crisis in patient with SS sickle cell disease.   2. Right medial parascapular muscular tender point.  Plan:  Dilaudid PCA Scheduled NSAID and APAP Accupressure to parascapular tender point.  Repeat CXR in AM to monitor for Acute Chest Syndrome Bedside Incentive Spirometry Monitor Hgb.  [name] D

## 2011-03-30 NOTE — ED Notes (Signed)
[facility] arrived for transport. Report given to RN on 5100. Bed 5118.

## 2011-03-30 NOTE — ED Notes (Signed)
Pt appeared to be having a dystonic reaction. Head/neck stiffened with mouth wide open and moaning. Did not appear to be seizurelike activity. Pt returned to normal within a few seconds of staff entering room. Pt states her pain is still a 7/10. Continues to moan and c/o severe pain. MD aware.

## 2011-03-30 NOTE — Progress Notes (Signed)
I am Ms. Ruperto' primary care physician at [facility].  I have reviewed the treatment plan as outlined by the inpatient team.  I agree with plan.  Please let me know if I can be of any help regarding patient care or discharge planning.

## 2011-03-30 NOTE — ED Notes (Signed)
[facility] not available for transport at this time. Pt/family aware. No change in pt condition.

## 2011-03-31 ENCOUNTER — Observation Stay (HOSPITAL_COMMUNITY): Payer: Medicaid Other

## 2011-03-31 LAB — CBC
HCT: 22.8 % — ABNORMAL LOW (ref 36.0–46.0)
Hemoglobin: 7.9 g/dL — ABNORMAL LOW (ref 12.0–15.0)
MCHC: 34.6 g/dL (ref 30.0–36.0)
WBC: 12.3 10*3/uL — ABNORMAL HIGH (ref 4.0–10.5)

## 2011-03-31 IMAGING — CR DG CHEST 1V PORT
1 series · 1 of 1 positions shown · non-contrast
Comparison: Chest radiograph 03/06/2010 and 08/11/2009

CLINICAL DATA: Sickle cell disease.  Shortness  of breath

PORTABLE CHEST - 1 VIEW

[view not recorded]
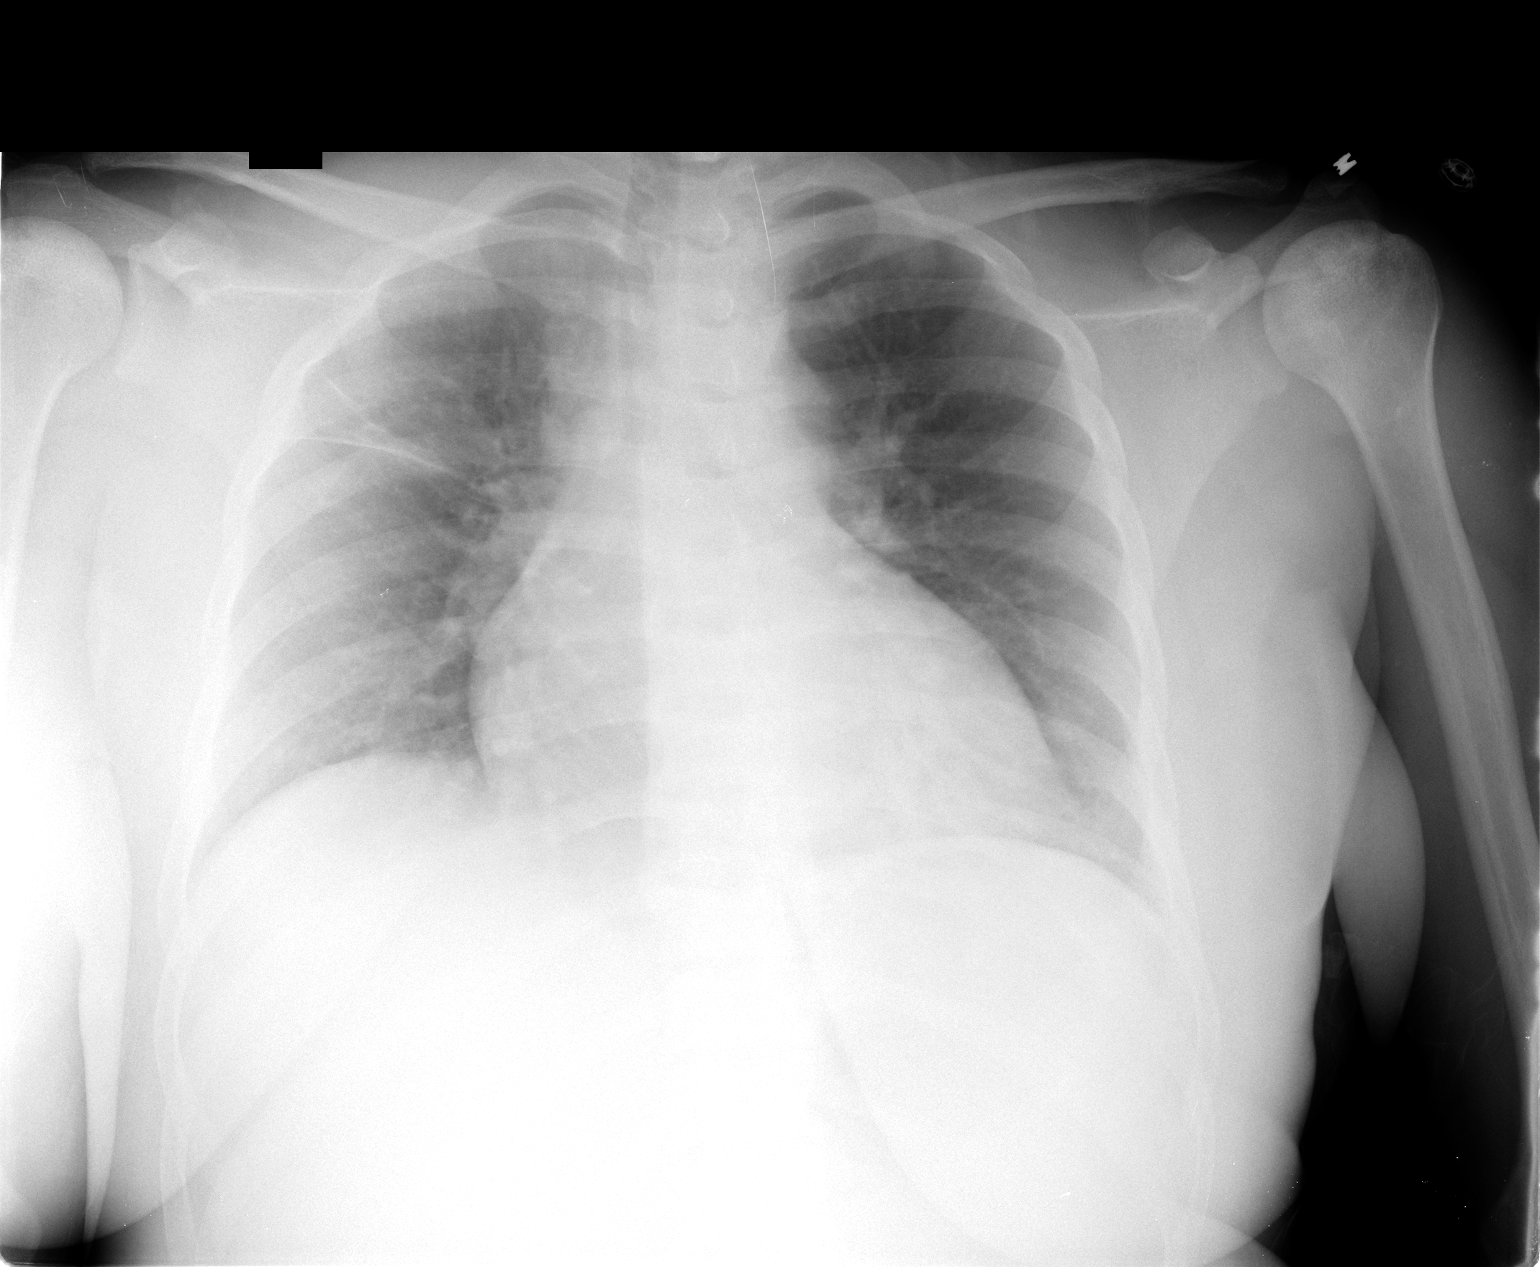

[1 of 1 positions shown; findings below may reference images not displayed]

FINDINGS: Heart size within normal limits for portable technique.
The minor fissure is elevated, with an adjacent patchy opacity.
The pulmonary vascularity is upper normal to mildly prominent.  No
pleural effusion.  Bony sequela of sickle cell disease noted in the
humeral heads bilaterally.
IMPRESSION: Patchy opacity right upper lobe with associated elevation of the
minor fissure is favored to be due to atelectasis.

Borderline pulmonary vascular congestion.

## 2011-03-31 MED ORDER — FOLIC ACID 1 MG PO TABS
1.0000 mg | ORAL_TABLET | Freq: Every day | ORAL | Status: DC
  Administered 2011-03-31 – 2011-04-02 (×3): 1 mg via ORAL
  Filled 2011-03-31 (×3): qty 1

## 2011-03-31 MED ORDER — POTASSIUM CHLORIDE IN NACL 20-0.9 MEQ/L-% IV SOLN
INTRAVENOUS | Status: DC
  Administered 2011-03-31: 1000 mL via INTRAVENOUS
  Filled 2011-03-31 (×2): qty 1000
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
## 2011-03-31 NOTE — Progress Notes (Signed)
FMTS Daily Intern Progress Note  Subjective: Continues to have some mid-abdominal pain and nausea; however able to tolerate food and fluids well.  Started having some chest pain with mild dyspnea last night (describes as reflux/gas) along the left sternal border; noticed an knot over her sternum.  Left arm pain improved at 5/10 pain.  Would like to continue PCA today and try PO meds tomorrow.  Received 13.9mg  of dilaudid in last 24hrs.  Objective Temp:  [97.4 F (36.3 C)-98.4 F (36.9 C)] 98.1 F (36.7 C) (11/15 0545) Pulse Rate:  [87-96] 92  (11/15 0545) Resp:  [16-20] 18  (11/15 0545) BP: (93-111)/(50-58) 96/50 mmHg (11/15 0545) SpO2:  [92 %-100 %] 96 % (11/15 0545)   Intake/Output Summary (Last 24 hours) at 03/31/11 0755 Last data filed at 03/31/11 0600  Gross per 24 hour  Intake 2342.33 ml  Output   2150 ml  Net 192.33 ml    General: alert, sitting in bed eating breakfast HEENT: sclera white, MMM, no pharyngeal erythema or exudate CV: RRR, no murmur Chest: tender to palpation along left sternal border; superficial 2cm solid, mobile, tender mass over superior sternum Pulm: CTAB, no wheezes or rales Abd: +BS, soft, diffusely tender to palpation, no rebound or guarding  Ext: no edema, 2+ DP pulses  Labs and Imaging  Lab 03/31/11 0640 03/30/11 0736 03/29/11 2238  WBC 12.3* 13.5* 16.4*  HGB 7.9* 8.6* 9.2*  HCT 22.8* 25.2* 26.3*  PLT 407* 461* 540*     Lab 03/30/11 0736 03/29/11 2238  NA -- 140  K -- 4.1  CL -- 105  CO2 -- 24  BUN -- 9  CREATININE 0.55 0.60  LABGLOM -- --  GLUCOSE -- 87  CALCIUM -- 9.8    Assessment and Plan [name] is a 20 y.o. year old female presenting with sickle cell pain crisis   1. Sickle cell pain crisis: No concern for acute chest on admission with normal CXR and no respiratory symptoms. Will start a dilaudid PCA as well as scheduled ibuprofen. She has received IVFs of NS + 20KCl at 100cc/hr for 30hrs. Current providing oxygen.   Developed chest pain with some dyspnea the evening of 11/14.  -Dilaudid PCA - transition to PO meds 11/16 -Ibuprofen 600mg  q6hr scheduled   -supplemental O2  -saline lock IV -2-view CXR  2. Depression: stable on home meds. Will continue wellbutrin   3. History of tobacco abuse: Quit 1 week ago; provide encouragement and support   FEN/GI: regular diet; saline lock IV  Prophylaxis: heparin SQ 5000 units TID; protonix IV 40mg   Disposition: pending clinical improvement   BOOTH, [name] Pager: [phone] 03/31/2011, 7:55 AM

## 2011-04-01 LAB — CBC
Hemoglobin: 8 g/dL — ABNORMAL LOW (ref 12.0–15.0)
MCH: 29.6 pg (ref 26.0–34.0)
MCV: 85.6 fL (ref 78.0–100.0)
RBC: 2.7 MIL/uL — ABNORMAL LOW (ref 3.87–5.11)

## 2011-04-01 MED ORDER — PANTOPRAZOLE SODIUM 40 MG PO TBEC
40.0000 mg | DELAYED_RELEASE_TABLET | Freq: Every day | ORAL | Status: DC
  Administered 2011-04-01 – 2011-04-02 (×2): 40 mg via ORAL
  Filled 2011-04-01 (×2): qty 1

## 2011-04-01 MED ORDER — HYDROMORPHONE 0.3 MG/ML IV SOLN
INTRAVENOUS | Status: AC
  Administered 2011-04-01: 7.5 mg via INTRAVENOUS
  Filled 2011-04-01: qty 25

## 2011-04-01 MED ORDER — SENNOSIDES-DOCUSATE SODIUM 8.6-50 MG PO TABS
1.0000 | ORAL_TABLET | Freq: Two times a day (BID) | ORAL | Status: DC | PRN
  Administered 2011-04-01: 1 via ORAL
  Filled 2011-04-01: qty 1

## 2011-04-01 MED ORDER — CYCLOBENZAPRINE HCL 5 MG PO TABS
5.0000 mg | ORAL_TABLET | Freq: Three times a day (TID) | ORAL | Status: DC
  Administered 2011-04-01 – 2011-04-02 (×5): 5 mg via ORAL
  Filled 2011-04-01 (×6): qty 1

## 2011-04-01 MED ORDER — POLYETHYLENE GLYCOL 3350 17 G PO PACK
17.0000 g | PACK | Freq: Every day | ORAL | Status: DC | PRN
  Filled 2011-04-01: qty 1

## 2011-04-01 NOTE — Progress Notes (Signed)
Pharmacy - IV-PO conversion of Pantoprazole  20yo F on IV PPI for GI prophylaxis.  Pt meets criteria for switching to PO alternative.  * No active GI bleed * Tolerating a PO diet * Taking other medication by the oral route  Will change to pantoprazole 40mg  PO daily.  [employer], Pharm.D. [phone]

## 2011-04-01 NOTE — Progress Notes (Signed)
I discussed the care plan with Dr.Booth and the FPTS team and agree with assessment and plan as documented in the progress note for today.Deniece Portela A. Sheffield Slider, MD Family Medicine Teaching Service Attending  04/01/2011 1:14 PM

## 2011-04-01 NOTE — Progress Notes (Signed)
FMTS Daily Intern Progress Note  Subjective: The only thing bother her today is increased sickle pain in her upper back.  States she can't lie on her back due to the pain; feels like the pain is in her bones.  Also having some headaches.  PCA is helping the pain.  Left arm pain and chest/gas pain improved.    I have reviewed the patient's medications.  Objective Temp:  [98.1 F (36.7 C)-99 F (37.2 C)] 98.7 F (37.1 C) (11/16 0635) Pulse Rate:  [77-100] 77  (11/16 0635) Resp:  [16-20] 18  (11/16 0635) BP: (101-117)/(43-65) 113/49 mmHg (11/16 0635) SpO2:  [95 %-100 %] 100 % (11/16 4098)   Intake/Output Summary (Last 24 hours) at 04/01/11 0721 Last data filed at 04/01/11 0600  Gross per 24 hour  Intake    988 ml  Output   3625 ml  Net  -2637 ml    General: tearful, lying on side, shifting position frequently HEENT: sclera white, MMM CV: RRR, no murmur Pulm: CTAB, no wheezes or rales Back: tender to palpation in right paraspinous muscles of upper back; no erythema or warmth noted  Ext: no edema, 2+ DP pulses  Labs and Imaging  Lab 04/01/11 0554 03/31/11 0640 03/30/11 0736  WBC 14.2* 12.3* 13.5*  HGB 8.0* 7.9* 8.6*  HCT 23.1* 22.8* 25.2*  PLT 466* 407* 461*     Lab 03/30/11 0736 03/29/11 2238  NA -- 140  K -- 4.1  CL -- 105  CO2 -- 24  BUN -- 9  CREATININE 0.55 0.60  LABGLOM -- --  GLUCOSE -- 87  CALCIUM -- 9.8   retic count 17.2%  Assessment and Plan [name] is a 20 y.o. year old female presenting with sickle cell pain crisis   1. Sickle cell pain crisis: No concern for acute chest on admission with normal CXR and no respiratory symptoms. Will start a dilaudid PCA as well as scheduled ibuprofen. She has received IVFs of NS + 20KCl at 100cc/hr for 30hrs. Current providing oxygen.  Developed chest pain with some dyspnea the evening of 11/14; CXR was normal and she remained afebrile without tachycardia, tachypnea, or O2 requirement.  Currently with  improving sickle pain in L arm, but worsening pain in upper back -continue Dilaudid PCA  -Ibuprofen 600mg  q6hr scheduled   -heating pad to back -will start flexeril -consider switching to PO pain meds (percocet 5-325mg  q4hr) this afternoon if pain improved  2. Depression: stable on home meds. Will continue wellbutrin   3. History of tobacco abuse: Quit 1 week ago; provide encouragement and support   FEN/GI: regular diet; saline lock IV  Prophylaxis: heparin SQ 5000 units TID; protonix IV 40mg   Disposition: pending clinical improvement   BOOTH, Gimena Buick Pager: [phone] 04/01/2011, 7:21 AM

## 2011-04-02 MED ORDER — OXYCODONE HCL 5 MG PO TABS: 5.0000 mg | ORAL_TABLET | ORAL | Status: DC | PRN

## 2011-04-02 MED ORDER — CYCLOBENZAPRINE HCL 5 MG PO TABS: 5.0000 mg | ORAL_TABLET | Freq: Three times a day (TID) | ORAL | Status: AC

## 2011-04-02 MED ORDER — OXYCODONE-ACETAMINOPHEN 5-325 MG PO TABS: 1.0000 | ORAL_TABLET | Freq: Four times a day (QID) | ORAL | Status: AC | PRN

## 2011-04-02 MED ORDER — OXYCODONE HCL 10 MG PO TABS: 10.0000 mg | ORAL_TABLET | ORAL | Status: AC | PRN

## 2011-04-02 MED ORDER — OXYCODONE-ACETAMINOPHEN 5-325 MG PO TABS
1.0000 | ORAL_TABLET | ORAL | Status: DC | PRN
  Administered 2011-04-02: 1 via ORAL
  Filled 2011-04-02: qty 1

## 2011-04-02 MED ORDER — FOLIC ACID 1 MG PO TABS: 1.0000 mg | ORAL_TABLET | Freq: Every day | ORAL | Status: DC

## 2011-04-02 NOTE — Progress Notes (Signed)
  FMTS Daily Intern Progress Note  Subjective: Pain in back improved, pt agrees to transition to po pain meds.  I have reviewed the patient's medications.  Objective Temp:  [97.8 F (36.6 C)-98.6 F (37 C)] 97.9 F (36.6 C) (11/17 0805) Pulse Rate:  [89-107] 98  (11/17 0805) Resp:  [16-20] 18  (11/17 0902) BP: (94-113)/(44-74) 104/44 mmHg (11/17 0805) SpO2:  [93 %-100 %] 95 % (11/17 0805)   General: NAD, sleeping quietly, easily arousable HEENT: sclera white, MMM CV: RRR, no murmur Pulm: CTAB, no wheezes or rales Ext: no edema  Labs and Imaging  Lab 04/01/11 0554 03/31/11 0640 03/30/11 0736  WBC 14.2* 12.3* 13.5*  HGB 8.0* 7.9* 8.6*  HCT 23.1* 22.8* 25.2*  PLT 466* 407* 461*     Assessment and Plan [name] is a 20 y.o. year old female presenting with sickle cell pain crisis   1. Sickle cell pain crisis: No concern for acute chest on admission with normal CXR and no respiratory symptoms. Will start a dilaudid PCA as well as scheduled ibuprofen. She has received IVFs of NS + 20KCl at 100cc/hr for 30hrs. Current providing oxygen.  Developed chest pain with some dyspnea the evening of 11/14; CXR was normal and she remained afebrile without tachycardia, tachypnea, or O2 requirement.  Currently with improving pain in back -d/c dilaudid pca, switch to po percocet -Ibuprofen 600mg  q6hr scheduled   -heating pad to back -continue flexeril  2. Depression: stable on home meds. Will continue wellbutrin   3. History of tobacco abuse: Quit 1 week ago; provide encouragement and support   FEN/GI: regular diet; saline lock IV   Prophylaxis: heparin SQ 5000 units TID; protonix IV 40mg    Disposition: most likely will d/c this afternoon or tomorrow am- depending on ability to transition to po pain medication.   Kruti Horacek Pager: [phone] 04/02/2011, 9:52 AM

## 2011-04-02 NOTE — Progress Notes (Signed)
I interviewed and examined this patient and discussed the care plan with Dr.Caviness and the FPTS team and agree with assessment and plan as documented in the progress note for today. She is still having significant upper back pain.     Casson Catena A. Sheffield Slider, MD Family Medicine Teaching Service Attending  04/02/2011 12:59 PM

## 2011-04-02 NOTE — Discharge Summary (Signed)
Discharge Summary 04/02/2011 7:29 PM  [name] DOB: 10-10-1990 MRN: [id number]  Date of Admission: 03/29/2011 Date of Discharge: 04/02/2011  PCP: [name], MD Consultants: none  Reason for Admission: sickle cell pain crisis  Discharge Diagnosis Primary 1. Sickle cell pain crisis Secondary 1. Depression 2. 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  1. Sickle Cell Pain Crisis: On admission, she was complaining of her typical sickle cell pain located primarily in her left arm as well as in her shoulders and upper back.  She was started on a dilaudid PCA and scheduled motrin.  A chest x-ray on admission was clear.  She also received IVFs and supplemental oxygen.  On day 2, she complained of some anterior chest pain; this was evaluated with a chest x-ray which was normal.  On day 3, her anterior chest pain and left arm pain improved, but was having significant back pain.  She was started on flexeril and a heating pad.  On the day of discharge, her pain was improved and she transitioned to PO pain medications.  2. Depression: Stable this admission; continued on home medications.  3. Tobacco Abuse: She quit 1 week prior to admission.  Provided encouragement.  Procedures: none  Discharge Medications Medication List  As of 04/02/2011  7:29 PM   START taking these medications         cyclobenzaprine 5 MG tablet   Commonly known as: FLEXERIL   Take 1 tablet (5 mg total) by mouth 3 (three) times daily.      folic acid 1 MG tablet   Commonly known as: FOLVITE   Take 1 tablet (1 mg total) by mouth daily.         CONTINUE taking these medications         buPROPion 150 MG 24 hr tablet   Commonly known as: WELLBUTRIN XL   Take 1 tablet (150 mg total) by mouth daily.      famotidine 20 MG  tablet   Commonly known as: PEPCID   Take 1 tablet (20 mg total) by mouth 2 (two) times daily.      Oxycodone HCl 10 MG Tabs   Take 1 tablet (10 mg total) by mouth every 4 (four) hours as needed (please use only if percocet not controlling pain. ).      oxyCODONE-acetaminophen 5-325 MG per tablet   Commonly known as: PERCOCET   Take 1 tablet by mouth every 6 (six) hours as needed for pain.      polyethylene glycol powder powder   Commonly known as: GLYCOLAX/MIRALAX         STOP taking these medications         ibuprofen 600 MG tablet          Where to get your medications    These are the prescriptions that you need to pick up. We sent them to a specific pharmacy, so you will need to go there to get them.   [id number] - [place], [place] - [address] AT [facility] [address]    [address] [place] [address]    Phone: [phone]        cyclobenzaprine 5 MG tablet   folic acid 1 MG tablet  You may get these medications from any pharmacy.         Oxycodone HCl 10 MG Tabs   oxyCODONE-acetaminophen 5-325 MG per tablet            Pertinent Hospital Labs  Lab 04/01/11 0554 03/31/11 0640 03/30/11 0736  WBC 14.2* 12.3* 13.5*  HGB 8.0* 7.9* 8.6*  HCT 23.1* 22.8* 25.2*  PLT 466* 407* 461*   Retic: 17.2%  CXR: no evidence for acute cardiopulmonary disease  Discharge instructions: see AVS  Condition at discharge: stable  Disposition: home with close follow up with PCP  Pending Tests: none  Follow up: Follow-up Information    Follow up with CAVINESS,DAWN on 04/04/2011. (Keep your appointment with Dr. Edmonia James)    Contact information:   [address] [place] [address] [phone]          Follow up Issues:  1. Needs to follow up with Hematologist in early December - please emphasize the importance of this appointment 2. F/u pain control

## 2011-04-03 NOTE — Progress Notes (Signed)
Home instructions reviewed & prescriptons given.  Pt. verbalized understanding & denied questions.  Left with boyfriend with whom she will be staying.

## 2011-04-03 NOTE — Discharge Summary (Signed)
I interviewed and examined this patient and discussed the care plan with Dr. Pincus Badder agree with assessment and plan as documented in the discharge note for today.    [name] A. Sheffield Slider, MD Family Medicine Teaching Service Attending  04/03/2011 5:45 PM

## 2011-04-05 ENCOUNTER — Ambulatory Visit: Payer: Medicaid Other | Admitting: Family Medicine

## 2011-04-17 ENCOUNTER — Emergency Department (HOSPITAL_COMMUNITY): Payer: Medicaid Other

## 2011-04-17 ENCOUNTER — Inpatient Hospital Stay (HOSPITAL_COMMUNITY)
Admission: EM | Admit: 2011-04-17 | Discharge: 2011-04-19 | DRG: 812 | Disposition: A | Discharge status: Home / Self Care | Payer: Medicaid Other | Source: Ambulatory Visit | Attending: Family Medicine | Admitting: Family Medicine

## 2011-04-17 ENCOUNTER — Encounter (HOSPITAL_COMMUNITY): Payer: Self-pay

## 2011-04-17 DIAGNOSIS — A499 Bacterial infection, unspecified: Secondary | ICD-10-CM | POA: Diagnosis present

## 2011-04-17 DIAGNOSIS — F329 Major depressive disorder, single episode, unspecified: Secondary | ICD-10-CM | POA: Diagnosis present

## 2011-04-17 DIAGNOSIS — F321 Major depressive disorder, single episode, moderate: Secondary | ICD-10-CM

## 2011-04-17 DIAGNOSIS — M62838 Other muscle spasm: Secondary | ICD-10-CM

## 2011-04-17 DIAGNOSIS — B9689 Other specified bacterial agents as the cause of diseases classified elsewhere: Secondary | ICD-10-CM | POA: Diagnosis present

## 2011-04-17 DIAGNOSIS — N760 Acute vaginitis: Secondary | ICD-10-CM | POA: Diagnosis present

## 2011-04-17 DIAGNOSIS — F633 Trichotillomania: Secondary | ICD-10-CM | POA: Diagnosis present

## 2011-04-17 DIAGNOSIS — M549 Dorsalgia, unspecified: Secondary | ICD-10-CM | POA: Diagnosis present

## 2011-04-17 DIAGNOSIS — F17200 Nicotine dependence, unspecified, uncomplicated: Secondary | ICD-10-CM | POA: Diagnosis present

## 2011-04-17 DIAGNOSIS — Z79899 Other long term (current) drug therapy: Secondary | ICD-10-CM

## 2011-04-17 DIAGNOSIS — D5700 Hb-SS disease with crisis, unspecified: Secondary | ICD-10-CM

## 2011-04-17 DIAGNOSIS — E860 Dehydration: Secondary | ICD-10-CM | POA: Diagnosis present

## 2011-04-17 DIAGNOSIS — F3289 Other specified depressive episodes: Secondary | ICD-10-CM | POA: Diagnosis present

## 2011-04-17 DIAGNOSIS — R569 Unspecified convulsions: Secondary | ICD-10-CM

## 2011-04-17 DIAGNOSIS — K219 Gastro-esophageal reflux disease without esophagitis: Secondary | ICD-10-CM | POA: Diagnosis present

## 2011-04-17 LAB — URINALYSIS, ROUTINE W REFLEX MICROSCOPIC
Ketones, ur: NEGATIVE mg/dL
Leukocytes, UA: NEGATIVE
Nitrite: NEGATIVE
Specific Gravity, Urine: 1.011 (ref 1.005–1.030)
pH: 7 (ref 5.0–8.0)

## 2011-04-17 LAB — COMPREHENSIVE METABOLIC PANEL
AST: 15 U/L (ref 0–37)
Albumin: 3.8 g/dL (ref 3.5–5.2)
BUN: 5 mg/dL — ABNORMAL LOW (ref 6–23)
Calcium: 9 mg/dL (ref 8.4–10.5)
Creatinine, Ser: 0.51 mg/dL (ref 0.50–1.10)
Total Bilirubin: 1.8 mg/dL — ABNORMAL HIGH (ref 0.3–1.2)
Total Protein: 7.1 g/dL (ref 6.0–8.3)

## 2011-04-17 LAB — RETICULOCYTES
RBC.: 3.17 MIL/uL — ABNORMAL LOW (ref 3.87–5.11)
Retic Count, Absolute: 475.5 10*3/uL — ABNORMAL HIGH (ref 19.0–186.0)
Retic Ct Pct: 15 % — ABNORMAL HIGH (ref 0.4–3.1)

## 2011-04-17 LAB — CBC
MCHC: 34.9 g/dL (ref 30.0–36.0)
Platelets: 531 10*3/uL — ABNORMAL HIGH (ref 150–400)
RDW: 16.2 % — ABNORMAL HIGH (ref 11.5–15.5)
WBC: 13.5 10*3/uL — ABNORMAL HIGH (ref 4.0–10.5)

## 2011-04-17 LAB — WET PREP, GENITAL: Trich, Wet Prep: NONE SEEN

## 2011-04-17 LAB — CREATININE, SERUM: Creatinine, Ser: 0.52 mg/dL (ref 0.50–1.10)

## 2011-04-17 LAB — RAPID URINE DRUG SCREEN, HOSP PERFORMED
Benzodiazepines: NOT DETECTED
Opiates: NOT DETECTED

## 2011-04-17 LAB — PREGNANCY, URINE: Preg Test, Ur: NEGATIVE

## 2011-04-17 MED ORDER — MORPHINE SULFATE (PF) 1 MG/ML IV SOLN
INTRAVENOUS | Status: DC
  Administered 2011-04-17 – 2011-04-18 (×2): via INTRAVENOUS
  Filled 2011-04-17 (×2): qty 25

## 2011-04-17 MED ORDER — NALOXONE HCL 0.4 MG/ML IJ SOLN: 0.4000 mg | INTRAMUSCULAR | Status: DC | PRN

## 2011-04-17 MED ORDER — DIPHENHYDRAMINE HCL 12.5 MG/5ML PO ELIX
12.5000 mg | ORAL_SOLUTION | Freq: Four times a day (QID) | ORAL | Status: DC | PRN
  Filled 2011-04-17: qty 5

## 2011-04-17 MED ORDER — HYDROMORPHONE HCL PF 2 MG/ML IJ SOLN
2.0000 mg | Freq: Once | INTRAMUSCULAR | Status: AC
  Administered 2011-04-17: 2 mg via INTRAVENOUS
  Filled 2011-04-17: qty 1

## 2011-04-17 MED ORDER — MORPHINE SULFATE 2 MG/ML IJ SOLN
INTRAMUSCULAR | Status: AC
  Administered 2011-04-17: 2 mg via INTRAVENOUS
  Filled 2011-04-17: qty 1

## 2011-04-17 MED ORDER — LORAZEPAM 2 MG/ML IJ SOLN
1.0000 mg | Freq: Once | INTRAMUSCULAR | Status: AC
  Administered 2011-04-17: 1 mg via INTRAVENOUS

## 2011-04-17 MED ORDER — ONDANSETRON HCL 4 MG/2ML IJ SOLN
4.0000 mg | Freq: Once | INTRAMUSCULAR | Status: AC
  Administered 2011-04-17: 4 mg via INTRAVENOUS
  Filled 2011-04-17: qty 2

## 2011-04-17 MED ORDER — HEPARIN SODIUM (PORCINE) 5000 UNIT/ML IJ SOLN: 5000.0000 [IU] | Freq: Three times a day (TID) | INTRAMUSCULAR | Status: DC

## 2011-04-17 MED ORDER — LORAZEPAM 2 MG/ML IJ SOLN
0.5000 mg | Freq: Once | INTRAMUSCULAR | Status: AC
  Administered 2011-04-17: 0.5 mg via INTRAVENOUS
  Filled 2011-04-17: qty 1

## 2011-04-17 MED ORDER — DEXTROSE-NACL 5-0.45 % IV SOLN
INTRAVENOUS | Status: DC
  Administered 2011-04-17 (×2): via INTRAVENOUS

## 2011-04-17 MED ORDER — HYDROMORPHONE HCL PF 1 MG/ML IJ SOLN
1.0000 mg | Freq: Once | INTRAMUSCULAR | Status: AC
  Administered 2011-04-17: 1 mg via INTRAVENOUS
  Filled 2011-04-17: qty 1

## 2011-04-17 MED ORDER — POLYETHYLENE GLYCOL 3350 17 GM/SCOOP PO POWD
17.0000 g | Freq: Two times a day (BID) | ORAL | Status: DC | PRN
  Filled 2011-04-17: qty 255

## 2011-04-17 MED ORDER — CYCLOBENZAPRINE HCL 10 MG PO TABS: 5.0000 mg | ORAL_TABLET | Freq: Three times a day (TID) | ORAL | Status: DC | PRN

## 2011-04-17 MED ORDER — BUPROPION HCL ER (XL) 150 MG PO TB24
150.0000 mg | ORAL_TABLET | Freq: Every day | ORAL | Status: DC
  Administered 2011-04-17 – 2011-04-19 (×3): 150 mg via ORAL
  Filled 2011-04-17 (×3): qty 1

## 2011-04-17 MED ORDER — IBUPROFEN 200 MG PO TABS
600.0000 mg | ORAL_TABLET | Freq: Three times a day (TID) | ORAL | Status: DC
  Administered 2011-04-17 – 2011-04-19 (×6): 600 mg via ORAL
  Filled 2011-04-17 (×8): qty 3

## 2011-04-17 MED ORDER — SODIUM CHLORIDE 0.9 % IJ SOLN: 9.0000 mL | INTRAMUSCULAR | Status: DC | PRN

## 2011-04-17 MED ORDER — FOLIC ACID 1 MG PO TABS
1.0000 mg | ORAL_TABLET | Freq: Every day | ORAL | Status: DC
  Administered 2011-04-17 – 2011-04-19 (×3): 1 mg via ORAL
  Filled 2011-04-17 (×3): qty 1

## 2011-04-17 MED ORDER — ONDANSETRON HCL 4 MG/2ML IJ SOLN: 4.0000 mg | Freq: Four times a day (QID) | INTRAMUSCULAR | Status: DC | PRN

## 2011-04-17 MED ORDER — LORAZEPAM 2 MG/ML IJ SOLN
INTRAMUSCULAR | Status: AC
  Filled 2011-04-17: qty 1

## 2011-04-17 MED ORDER — MORPHINE SULFATE 2 MG/ML IJ SOLN
2.0000 mg | Freq: Once | INTRAMUSCULAR | Status: AC
  Administered 2011-04-17: 2 mg via INTRAVENOUS

## 2011-04-17 MED ORDER — DIPHENHYDRAMINE HCL 50 MG/ML IJ SOLN: 12.5000 mg | Freq: Four times a day (QID) | INTRAMUSCULAR | Status: DC | PRN

## 2011-04-17 MED ORDER — DOCUSATE SODIUM 100 MG PO CAPS
100.0000 mg | ORAL_CAPSULE | Freq: Two times a day (BID) | ORAL | Status: DC
  Administered 2011-04-17 – 2011-04-19 (×5): 100 mg via ORAL
  Filled 2011-04-17 (×6): qty 1

## 2011-04-17 MED ORDER — METRONIDAZOLE 500 MG PO TABS
500.0000 mg | ORAL_TABLET | Freq: Two times a day (BID) | ORAL | Status: DC
  Administered 2011-04-17 – 2011-04-19 (×4): 500 mg via ORAL
  Filled 2011-04-17 (×5): qty 1

## 2011-04-17 MED ORDER — SODIUM CHLORIDE 0.45 % IV SOLN
INTRAVENOUS | Status: DC
  Administered 2011-04-17: 16:00:00 via INTRAVENOUS

## 2011-04-17 MED ORDER — HEPARIN SODIUM (PORCINE) 5000 UNIT/ML IJ SOLN
5000.0000 [IU] | Freq: Three times a day (TID) | INTRAMUSCULAR | Status: DC
  Administered 2011-04-18: 5000 [IU] via SUBCUTANEOUS
  Filled 2011-04-17 (×9): qty 1

## 2011-04-17 NOTE — ED Provider Notes (Signed)
History     CSN: [id number] Arrival date & time: 04/17/2011  8:41 AM   First MD Initiated Contact with Patient 04/17/11 [phone]      Chief Complaint  Patient presents with  . Sickle Cell Pain Crisis  . Back Pain    (Consider location/radiation/quality/duration/timing/severity/associated sxs/prior treatment) Patient is a 20 y.o. female presenting with sickle cell pain and back pain. The history is provided by the patient.  Sickle Cell Pain Crisis  This is a new problem. The current episode started yesterday. The problem occurs continuously. The problem has been gradually worsening. Pain location: lower back. Site of pain is localized in bone and muscle. The pain is similar to prior episodes. The pain is moderate. The symptoms are aggravated by activity and movement. Associated symptoms include back pain. Pertinent negatives include no chest pain, no blurred vision, no abdominal pain, no nausea, no vomiting, no dysuria, no congestion, no ear pain, no headaches, no sore throat, no cough and no difficulty breathing. She sickle cell type is SS. There were no sick contacts.  Back Pain  Pertinent negatives include no chest pain, no fever, no headaches, no abdominal pain, no dysuria and no pelvic pain.  PT states she was last in the hospital a month ago with the same pain. Took oxycodone at home with no relief. No other complaints.  Past Medical History  Diagnosis Date  . Sickle cell anemia with crisis   . Sickle cell anemia   . H/O: 1 miscarriage 03/22/2011  . SICKLE CELL ANEMIA 01/08/2009  . 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 Active smoker 08/09/2010  . Depression 01/06/2011  . GERD (gastroesophageal reflux disease) 02/17/2011  . Sickle cell anemia     pf has frequent sickle cell crisis  . Sickle cell anemia     Past Surgical History  Procedure Date  . Cholecystectomy   . Choley     No family history on file.  History  Substance Use Topics  . Smoking status: Current Everyday Smoker   Types: Cigars  . Smokeless tobacco: Never Used  . Alcohol Use: No    OB History    Grav Para Term Preterm Abortions TAB SAB Ect Mult Living   1               Review of Systems  Constitutional: Negative for fever and chills.  HENT: Negative for ear pain, congestion, sore throat and mouth sores.   Eyes: Negative.  Negative for blurred vision.  Respiratory: Negative for cough.   Cardiovascular: Negative for chest pain.  Gastrointestinal: Negative for nausea, vomiting and abdominal pain.  Genitourinary: Negative for dysuria, flank pain, difficulty urinating and pelvic pain.  Musculoskeletal: Positive for back pain.  Neurological: Negative for light-headedness and headaches.  Psychiatric/Behavioral: Negative.     Allergies  Toradol  Home Medications   Current Outpatient Rx  Name Route Sig Dispense Refill  . BUPROPION HCL ER (XL) 150 MG PO TB24 Oral Take 1 tablet (150 mg total) by mouth daily. 30 tablet 1  . OXYCODONE HCL 10 MG PO TABS Oral Take 10 mg by mouth every 6 (six) hours as needed. For pain.     . OXYCODONE-ACETAMINOPHEN 5-325 MG PO TABS Oral Take 1 tablet by mouth every 4 (four) hours as needed. For pain.     Marland Kitchen POLYETHYLENE GLYCOL 3350 PO POWD Oral Take 17 g by mouth 2 (two) times daily as needed. For constipation      BP 111/64  Pulse 88  Temp(Src) 98.8  F (37.1 C) (Oral)  SpO2 100%  LMP 03/26/2011  Physical Exam  Nursing note and vitals reviewed. Constitutional: She is oriented to person, place, and time. She appears well-developed and well-nourished.       Uncomfortable appearing  HENT:  Head: Normocephalic and atraumatic.  Eyes: Conjunctivae are normal.  Neck: Neck supple.  Cardiovascular: Normal rate, regular rhythm and normal heart sounds.   Pulmonary/Chest: Effort normal and breath sounds normal. No respiratory distress.  Abdominal: Soft. Bowel sounds are normal. There is no tenderness.  Musculoskeletal: Normal range of motion.       Diffuse  tenderness over lumbar spine, no midline tenderness, no rash, swelling, or bruising. Normal upper and lower extremity strength!!!!  Neurological: She is alert and oriented to person, place, and time. She has normal reflexes.  Skin: Skin is warm and dry.  Psychiatric: She has a normal mood and affect.    ED Course  Procedures (including critical care time)  Labs Reviewed  CBC - Abnormal; Notable for the following:    WBC 13.5 (*)    RBC 3.17 (*)    Hemoglobin 9.4 (*)    HCT 26.9 (*)    RDW 16.2 (*)    Platelets 531 (*)    All other components within normal limits  RETICULOCYTES - Abnormal; Notable for the following:    Retic Ct Pct 15.0 (*)    RBC. 3.17 (*)    Retic Count, Manual 475.5 (*)    All other components within normal limits  COMPREHENSIVE METABOLIC PANEL - Abnormal; Notable for the following:    Potassium 3.4 (*)    Glucose, Bld 111 (*)    BUN 5 (*)    Total Bilirubin 1.8 (*)    All other components within normal limits  PREGNANCY, URINE  URINALYSIS, ROUTINE W REFLEX MICROSCOPIC   Pt with typical sickle cell crisis. VS normal. Will start fluids, pain medications, will monitor.   11:53 AM Pt had an unresponsive episode where per nurse "eyes rolled back" and pt was not responding, then she was shaking, possible seizure? Lasted about a minute. Pt awake, solonolent, confused. Ativan given IV. Will get ECG, CT head, will admit.   11:59 AM Spoke with family practice. Will admit  12:10 PM Spoke with Dr. Thad Ranger, neurology, will come see pt for consult   Date: 04/17/2011  Rate: 94  Rhythm: normal sinus rhythm  QRS Axis: normal  Intervals: normal  ST/T Wave abnormalities: nonspecific T wave changes  Conduction Disutrbances:none  Narrative Interpretation:   Old EKG Reviewed: changes noted New T wave changes in anterior leads    MDM          Lottie Mussel, PA 04/17/11 1211  Lottie Mussel, PA 04/17/11 1426

## 2011-04-17 NOTE — ED Notes (Signed)
Pt c/o diffuse back pain as a result of sickle cell crisis. Pt stated she took oxycodone last night and this morning with no relief. Pt is grimacing and guarding in room.

## 2011-04-17 NOTE — ED Notes (Signed)
MD aware of latest BP.  

## 2011-04-17 NOTE — ED Notes (Signed)
Pt had what appeared to be seizure like activity. Pt moved eyes from side to side and would keep arm up when held in air. Pt states she had something pop in her back and she was in a lot of pain. Pt was put on non rebreather and cardiac monitoring. Pt in NAD at this current time.

## 2011-04-17 NOTE — Progress Notes (Signed)
Patient oriented to room, call bell system. Patient declined to watch safety video at present time. Call light placed within reach. Patient significant other present at bedside. Patient ambulated to and from bathroom with supervision only, gait steady.

## 2011-04-17 NOTE — ED Notes (Signed)
IV team paged to start IV. Multiple attempts.

## 2011-04-17 NOTE — Consult Note (Signed)
Reason for Consult:syncope  Referring Physician: Jennette Kettle  CC: syncope   HPI: [name] is an 20 y.o. female with h/o sickle cell disease who presented with pain. While being evaluated in the ED had an episode witnessed by nursing where the patient's eyes moved from side to side, held her arm in the air, eyes rolled back in her head and was noted to have some jerking movements of the lower extremities. Patient was also unresponsive. Episode lasted about a minute. Patient just prior to that time had reported that she was in a lot of pain and felt a pop in her back. Patient given Ativan.  Has had an episode of syncope in the past related to dehydration.    Past Medical History  Diagnosis Date  . Sickle cell anemia with crisis   . Sickle cell anemia   . H/O: 1 miscarriage 03/22/2011  . SICKLE CELL ANEMIA 01/08/2009  . 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 Active smoker 08/09/2010  . Depression 01/06/2011  . GERD (gastroesophageal reflux disease) 02/17/2011  . Sickle cell anemia     pf has frequent sickle cell crisis  . Sickle cell anemia     Past Surgical History  Procedure Date  . Cholecystectomy   . Choley     No family history on file.  Social History:  reports that she has been smoking Cigars.  She has never used smokeless tobacco. She reports that she does not drink alcohol or use illicit drugs.  Allergies  Allergen Reactions  . Toradol Hives and Itching    Some itching, give with benedryl    Medications: I have reviewed the patient's current medications. Wellbutrin, Oxycodone, Miralax  ROS: History obtained from the patient  General ROS: negative for - chills, fatigue, fever, night sweats, weight gain or weight loss Psychological ROS: negative for - behavioral disorder, hallucinations, memory difficulties, mood swings or suicidal ideation Ophthalmic ROS: negative for - blurry vision, double vision, eye pain or loss of vision ENT ROS: negative for - epistaxis, nasal discharge,  oral lesions, sore throat, tinnitus or vertigo Allergy and Immunology ROS: negative for - hives or itchy/watery eyes Hematological and Lymphatic ROS: negative for - bleeding problems, bruising or swollen lymph nodes Endocrine ROS: negative for - galactorrhea, hair pattern changes, polydipsia/polyuria or temperature intolerance Respiratory ROS: negative for - cough, hemoptysis, shortness of breath or wheezing Cardiovascular ROS: as noted in HPI Gastrointestinal ROS: negative for - abdominal pain, diarrhea, hematemesis, nausea/vomiting or stool incontinence Genito-Urinary ROS: negative for - dysuria, hematuria, incontinence or urinary frequency/urgency Musculoskeletal ROS: back pain Neurological ROS: as noted in HPI Dermatological ROS: negative for rash and skin lesion changes  Blood pressure 115/77, pulse 91, temperature 98.5 F (36.9 C), temperature source Oral, resp. rate 20, height 5' 2.5" (1.588 m), weight 75 kg (165 lb 5.5 oz), last menstrual period 03/26/2011, SpO2 98.00%.  Neurologic Examination: Mental Status: Alert, oriented, thought content appropriate.  Speech fluent without evidence of aphasia.  Able to follow 3 step commands without difficulty. Cranial Nerves: II: visual fields grossly normal, pupils equal, round, reactive to light and accommodation III,IV, VI: ptosis not present, extraocular muscles extra-ocular motions intact bilaterally V,VII: smile symmetric, facial light touch sensation normal bilaterally VIII: hearing normal bilaterally IX,X: gag reflex present XI: trapezius strength/neck flexion strength normal bilaterally XII: tongue strength normal  Motor: Right : Upper extremity   5/5    Left:     Upper extremity   5/5  Lower extremity   5/5  Lower extremity   5/5 Tone and bulk:normal tone throughout; no atrophy noted Sensory: Pinprick and light touch intact throughout, bilaterally Deep Tendon Reflexes: 2+ and symmetric throughout Plantars: Right:  downgoing   Left: downgoing Cerebellar: normal finger-to-nose and normal heel-to-shin test  Results for orders placed during the hospital encounter of 04/17/11 (from the past 48 hour(s))  CBC     Status: Abnormal   Collection Time   04/17/11  9:08 AM      Component Value Range Comment   WBC 13.5 (*) 4.0 - 10.5 (K/uL)    RBC 3.17 (*) 3.87 - 5.11 (MIL/uL)    Hemoglobin 9.4 (*) 12.0 - 15.0 (g/dL)    HCT 16.1 (*) 09.6 - 46.0 (%)    MCV 84.9  78.0 - 100.0 (fL)    MCH 29.7  26.0 - 34.0 (pg)    MCHC 34.9  30.0 - 36.0 (g/dL)    RDW 04.5 (*) 40.9 - 15.5 (%)    Platelets 531 (*) 150 - 400 (K/uL)   RETICULOCYTES     Status: Abnormal   Collection Time   04/17/11  9:08 AM      Component Value Range Comment   Retic Ct Pct 15.0 (*) 0.4 - 3.1 (%)    RBC. 3.17 (*) 3.87 - 5.11 (MIL/uL)    Retic Count, Manual 475.5 (*) 19.0 - 186.0 (K/uL)   COMPREHENSIVE METABOLIC PANEL     Status: Abnormal   Collection Time   04/17/11  9:08 AM      Component Value Range Comment   Sodium 139  135 - 145 (mEq/L)    Potassium 3.4 (*) 3.5 - 5.1 (mEq/L)    Chloride 108  96 - 112 (mEq/L)    CO2 23  19 - 32 (mEq/L)    Glucose, Bld 111 (*) 70 - 99 (mg/dL)    BUN 5 (*) 6 - 23 (mg/dL)    Creatinine, Ser 8.11  0.50 - 1.10 (mg/dL)    Calcium 9.0  8.4 - 10.5 (mg/dL)    Total Protein 7.1  6.0 - 8.3 (g/dL)    Albumin 3.8  3.5 - 5.2 (g/dL)    AST 15  0 - 37 (U/L)    ALT 10  0 - 35 (U/L)    Alkaline Phosphatase 56  39 - 117 (U/L)    Total Bilirubin 1.8 (*) 0.3 - 1.2 (mg/dL)    GFR calc non Af Amer >90  >90 (mL/min)    GFR calc Af Amer >90  >90 (mL/min)   PREGNANCY, URINE     Status: Normal   Collection Time   04/17/11 10:14 AM      Component Value Range Comment   Preg Test, Ur NEGATIVE     URINALYSIS, ROUTINE W REFLEX MICROSCOPIC     Status: Normal   Collection Time   04/17/11 10:14 AM      Component Value Range Comment   Color, Urine YELLOW  YELLOW     APPearance CLEAR  CLEAR     Specific Gravity, Urine 1.011  1.005  - 1.030     pH 7.0  5.0 - 8.0     Glucose, UA NEGATIVE  NEGATIVE (mg/dL)    Hgb urine dipstick NEGATIVE  NEGATIVE     Bilirubin Urine NEGATIVE  NEGATIVE     Ketones, ur NEGATIVE  NEGATIVE (mg/dL)    Protein, ur NEGATIVE  NEGATIVE (mg/dL)    Urobilinogen, UA 1.0  0.0 - 1.0 (mg/dL)    Nitrite  NEGATIVE  NEGATIVE     Leukocytes, UA NEGATIVE  NEGATIVE  MICROSCOPIC NOT DONE ON URINES WITH NEGATIVE PROTEIN, BLOOD, LEUKOCYTES, NITRITE, OR GLUCOSE <1000 mg/dL.  URINE RAPID DRUG SCREEN (HOSP PERFORMED)     Status: Abnormal   Collection Time   04/17/11 10:14 AM      Component Value Range Comment   Opiates NONE DETECTED  NONE DETECTED     Cocaine NONE DETECTED  NONE DETECTED     Benzodiazepines NONE DETECTED  NONE DETECTED     Amphetamines NONE DETECTED  NONE DETECTED     Tetrahydrocannabinol POSITIVE (*) NONE DETECTED     Barbiturates NONE DETECTED  NONE DETECTED    WET PREP, GENITAL     Status: Abnormal   Collection Time   04/17/11 12:37 PM      Component Value Range Comment   Yeast, Wet Prep NONE SEEN  NONE SEEN     Trich, Wet Prep NONE SEEN  NONE SEEN     Clue Cells, Wet Prep MANY (*) NONE SEEN     WBC, Wet Prep HPF POC RARE (*) NONE SEEN    CREATININE, SERUM     Status: Normal   Collection Time   04/17/11  1:51 PM      Component Value Range Comment   Creatinine, Ser 0.52  0.50 - 1.10 (mg/dL)    GFR calc non Af Amer >90  >90 (mL/min)    GFR calc Af Amer >90  >90 (mL/min)     Dg Chest 2 View  04/17/2011  *RADIOLOGY REPORT*  Clinical Data: Chest pain  CHEST - 2 VIEW  Comparison: 03/31/2011  Findings: Heart is upper limits normal in size.  Lungs are clear. No effusions.  No acute bony abnormality.  Mild peribronchial thickening.  Right upper lobe atelectasis or scarring.  IMPRESSION: Suspect bronchitic changes.  Original Report Authenticated By: Cyndie Chime, M.D.   Ct Head Wo Contrast  04/17/2011  *RADIOLOGY REPORT*  Clinical Data: Sickle cell pain, crisis.  CT HEAD WITHOUT CONTRAST   Technique:  Contiguous axial images were obtained from the base of the skull through the vertex without contrast.  Comparison: 08/14/2007  Findings: No acute intracranial abnormality.  Specifically, no hemorrhage, hydrocephalus, mass lesion, acute infarction, or significant intracranial injury.  No acute calvarial abnormality. Visualized paranasal sinuses and mastoids clear.  Orbital soft tissues unremarkable.  IMPRESSION: Normal study.  Original Report Authenticated By: Cyndie Chime, M.D.     Assessment/Plan:  Patient Active Hospital Problem List: Syncope   Assessment: Syncopal episode may be related to pain.  Seizure remains in the differential but is less likely.  CT unremarkable but patient on Wellbutrin which does decreased the seizure threshold.    Plan: 1. No AED's at the time.  May continue Wellbutrin.            2. EEG in AM with no further intervention recommended unless there are further events or the EEG is diagnostic.  Thana Farr, MD [facility] [phone] 04/17/2011, 3:55 PM

## 2011-04-17 NOTE — H&P (Signed)
Family Medicine Teaching [facility] Admission History and Physical  Patient name: [name] Medical record number: [id number] Date of birth: [birth year] Age: 20 y.o. Gender: female  Primary Care Provider: [name], MD  Chief Complaint: sickle cell crisis History of Present Illness: [name] is a 20 y.o. year old female presenting with sickle cell crisis.  She began having back pain 1 day ago.  She took oxycodone 2 tabs q4-6 since then and has not gotten better.  She states that the pain is down her spine and is making her chest hurt. She states that she has some stomach pain that is intermittent and is having vaginal discharge, itching, and odor x 1 week.  She denies N/V/D and she had a normal BM yesterday.  She states that she is hearing voices in her head with her depression that are telling her she is a bad person.  She denies voices with SI/HI or any of that herself.    Patient Active Problem List  Diagnoses  . SICKLE CELL ANEMIA  . TRICHOTILLOMANIA  . DRY SKIN  . Active smoker  . Back pain  . Depression  . GERD (gastroesophageal reflux disease)  . Contraception management  . Sickle-cell disease with vaso-occlusive pain   Past Medical History: Past Medical History  Diagnosis Date  . Sickle cell anemia with crisis   . Sickle cell anemia   . H/O: 1 miscarriage 03/22/2011  . SICKLE CELL ANEMIA 01/08/2009  . 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 Active smoker 08/09/2010  . Depression 01/06/2011  . GERD (gastroesophageal reflux disease) 02/17/2011  . Sickle cell anemia     pf has frequent sickle cell crisis  . Sickle cell anemia     Past Surgical History: Past Surgical History  Procedure Date  . Cholecystectomy   . Choley     Social History: History   Social History  . Marital Status: Single    Spouse Name: N/A    Number of Children: N/A  . Years of Education: N/A   Social History Main Topics  . Smoking status: Current Everyday Smoker    Types:  Cigars  . Smokeless tobacco: Never Used  . Alcohol Use: No  . Drug Use: No  . Sexually Active: Yes   Other Topics Concern  . None   Social History Narrative  . None    Family History: No family history on file.  Allergies: Allergies  Allergen Reactions  . Toradol Hives and Itching    Some itching, give with benedryl    Current Facility-Administered Medications  Medication Dose Route Frequency Provider Last Rate Last Dose  . 0.45 % sodium chloride infusion   Intravenous Continuous [name]      . cyclobenzaprine (FLEXERIL) tablet 5 mg  5 mg Oral TID PRN [name]      . dextrose 5 %-0.45 % sodium chloride infusion   Intravenous Continuous Tatyana A Kirichenko, PA 50 mL/hr at 04/17/11 1154    . diphenhydrAMINE (BENADRYL) injection 12.5 mg  12.5 mg Intravenous Q6H PRN [name]       Or  . diphenhydrAMINE (BENADRYL) 12.5 MG/5ML elixir 12.5 mg  12.5 mg Oral Q6H PRN [name]      . docusate sodium (COLACE) capsule 100 mg  100 mg Oral BID [name]      . folic acid (FOLVITE) tablet 1 mg  1 mg Oral Daily [name]      . heparin injection 5,000 Units  5,000 Units Subcutaneous Q8H  [name]      . HYDROmorphone (DILAUDID) injection 1 mg  1 mg Intravenous Once Tatyana A Kirichenko, PA   1 mg at 04/17/11 0936  . HYDROmorphone (DILAUDID) injection 2 mg  2 mg Intravenous Once Tatyana A Kirichenko, PA   2 mg at 04/17/11 1132  . ibuprofen (ADVIL,MOTRIN) tablet 600 mg  600 mg Oral TID [name]      . LORazepam (ATIVAN) injection 0.5 mg  0.5 mg Intravenous Once Tatyana A Kirichenko, PA   0.5 mg at 04/17/11 1049  . LORazepam (ATIVAN) injection 1 mg  1 mg Intravenous Once Nelia Shi, MD   1 mg at 04/17/11 1146  . morphine 1 MG/ML PCA injection   Intravenous Q4H Libero Puthoff      . naloxone [facility]) injection 0.4 mg  0.4 mg Intravenous PRN [name]       And  . sodium chloride 0.9 % injection 9 mL  9 mL Intravenous PRN [name]       . ondansetron (ZOFRAN) injection 4 mg  4 mg Intravenous Once Tatyana A Kirichenko, PA   4 mg at 04/17/11 1130  . ondansetron (ZOFRAN) injection 4 mg  4 mg Intravenous Q6H PRN [name]      . DISCONTD: heparin injection 5,000 Units  5,000 Units Subcutaneous Q8H [name]       Current Outpatient Prescriptions  Medication Sig Dispense Refill  . buPROPion (WELLBUTRIN XL) 150 MG 24 hr tablet Take 1 tablet (150 mg total) by mouth daily.  30 tablet  1  . Oxycodone HCl 10 MG TABS Take 10 mg by mouth every 6 (six) hours as needed. For pain.       Marland Kitchen oxyCODONE-acetaminophen (PERCOCET) 5-325 MG per tablet Take 1 tablet by mouth every 4 (four) hours as needed. For pain.       . polyethylene glycol powder (GLYCOLAX/MIRALAX) powder Take 17 g by mouth 2 (two) times daily as needed. For constipation       Review Of Systems: Per HPI with the following additions: no HA, SOB Otherwise 12 point review of systems was performed and was unremarkable.  Physical Exam:  Pulse: 84  Blood Pressure: 126/80 RR: 11   O2: 100 on RA Temp: 98.8  General: alert, cooperative, moderate distress and tearful. HEENT: PERRLA, extra ocular movement intact, sclera clear, anicteric, oropharynx clear, no lesions, neck supple with midline trachea and thyroid without masses Heart: S1, S2 normal, no murmur, rub or gallop, regular rate and rhythm Lungs: clear to auscultation, no wheezes or rales and unlabored breathing Abdomen: mild tenderness in the in the entire abdomen and mild distension., no rebound tenderness Extremities: extremities normal, atraumatic, no cyanosis or edema Skin:no rashes MSK-area of tenderness is just below scapula on right where there is an area of spasm Neurology: normal without focal findings, mental status, speech normal, alert and oriented x3, PERLA, cranial nerves 2-12 intact and muscle tone and strength normal and symmetric GYN- external genetalia normal, without lesions.  Vagina normal color,  rugations, with thin white discharge.  Cervix normal color without lesions or discharge.  Labs and Imaging: Lab Results  Component Value Date/Time   NA 139 04/17/2011  9:08 AM   K 3.4* 04/17/2011  9:08 AM   CL 108 04/17/2011  9:08 AM   CO2 23 04/17/2011  9:08 AM   BUN 5* 04/17/2011  9:08 AM   CREATININE 0.51 04/17/2011  9:08 AM   GLUCOSE 111* 04/17/2011  9:08 AM  Lab Results  Component Value Date   WBC 13.5* 04/17/2011   HGB 9.4* 04/17/2011   HCT 26.9* 04/17/2011   MCV 84.9 04/17/2011   PLT 531* 04/17/2011    Dg Chest 2 View  04/17/2011  *RADIOLOGY REPORT*  Clinical Data: Chest pain  CHEST - 2 VIEW  Comparison: 03/31/2011  Findings: Heart is upper limits normal in size.  Lungs are clear. No effusions.  No acute bony abnormality.  Mild peribronchial thickening.  Right upper lobe atelectasis or scarring.  IMPRESSION: Suspect bronchitic changes.  Original Report Authenticated By: Cyndie Chime, M.D.   Ct Head Wo Contrast  04/17/2011  *RADIOLOGY REPORT*  Clinical Data: Sickle cell pain, crisis.  CT HEAD WITHOUT CONTRAST  Technique:  Contiguous axial images were obtained from the base of the skull through the vertex without contrast.  Comparison: 08/14/2007  Findings: No acute intracranial abnormality.  Specifically, no hemorrhage, hydrocephalus, mass lesion, acute infarction, or significant intracranial injury.  No acute calvarial abnormality. Visualized paranasal sinuses and mastoids clear.  Orbital soft tissues unremarkable.  IMPRESSION: Normal study.  Original Report Authenticated By: Cyndie Chime, M.D.      Assessment and Plan: [name] is a 20 y.o. year old female presenting with sickle cell pain crisis and depression.   1. Sickle cell vaso-occlusive crisis-pt presents with pain that she states is due to sickle cell crisis.  On exam, most of her pain is located over an area of muscle spasm.  Will start a reduced dose PCA with morphine for sickle cell pain and will give flexiril and  a heating pad for muscle spasm.  Will give motrin 600 TID since pt was able to tolerate that at last admission.  Will hydrate with 149ml/hr NS since pt appears mildly dehydrated.   2.   Depression- pt with significant PMH of mild to moderate depression.  Today she has some psychotic features.  We will consult psychiatry on Monday for evaluation.   3.   Vaginal discharge- wet prep concerning for BV, Gc/C pending.  Will treat with flagyl.   4.   Tobacco abuse-  Discussed smoking cessation.  Will provide nicotine patch if pt requests it. 5.   trichtillomania- chronic, pt has pulled hair of head, face and pubic area 6. Seizure like activity-  Pt with no residual effects, received ativan x 1, neuro to see pt for further evaulation. 7. FEN/GI: regular diet. 8. Prophylaxis: heparin 5000 Units SQ 9. Disposition: pending clinical improvement

## 2011-04-17 NOTE — ED Provider Notes (Signed)
Medical screening examination/treatment/procedure(s) were performed by non-physician practitioner and as supervising physician I was immediately available for consultation/collaboration.    Nelia Shi, MD 04/17/11 [phone]

## 2011-04-18 ENCOUNTER — Inpatient Hospital Stay (HOSPITAL_COMMUNITY): Payer: Medicaid Other

## 2011-04-18 LAB — CBC
HCT: 24.3 % — ABNORMAL LOW (ref 36.0–46.0)
Hemoglobin: 8.5 g/dL — ABNORMAL LOW (ref 12.0–15.0)
MCH: 29.9 pg (ref 26.0–34.0)
MCHC: 35 g/dL (ref 30.0–36.0)
MCV: 85.6 fL (ref 78.0–100.0)

## 2011-04-18 LAB — BASIC METABOLIC PANEL
BUN: 6 mg/dL (ref 6–23)
CO2: 23 mEq/L (ref 19–32)
Chloride: 106 mEq/L (ref 96–112)
Creatinine, Ser: 0.59 mg/dL (ref 0.50–1.10)
GFR calc Af Amer: >90 mL/min (ref >90)
Glucose, Bld: 84 mg/dL (ref 70–99)
Potassium: 3.3 mEq/L — ABNORMAL LOW (ref 3.5–5.1)

## 2011-04-18 LAB — GC/CHLAMYDIA PROBE AMP, GENITAL
Chlamydia, DNA Probe: NEGATIVE
GC Probe Amp, Genital: NEGATIVE

## 2011-04-18 MED ORDER — OXYCODONE-ACETAMINOPHEN 5-325 MG PO TABS
1.0000 | ORAL_TABLET | ORAL | Status: DC | PRN
  Administered 2011-04-18: 1 via ORAL
  Filled 2011-04-18 (×2): qty 2

## 2011-04-18 NOTE — Procedures (Signed)
REFERRING PHYSICIAN:  Dr. Betti Cruz.  HISTORY:  A 20 year old female with sickle cell crisis and episode of unresponsiveness.  MEDICATIONS:  Wellbutrin, Ativan, Dilaudid, Motrin, Flagyl, morphine, Zofran, heparin, Colace and Flexeril.  CONDITIONS OF RECORDING:  This is a 16-channel EEG carried out with the patient in the awake, drowsy and asleep states.  DESCRIPTION:  The patient is awake for only brief periods during the tracing.  During this time, the waking background activity consists of a low-voltage symmetrical, fairly well-organized, 9 Hz alpha activity seen from the parieto-occipital and posterotemporal regions.  Low-voltage, fast activity, poorly organized was seen and during at times, superimposed on more posterior rhythms.  A mixture of theta and alpha rhythm was seen from the central and temporal regions.  The patient drowses briefly as well with slowing to irregular which is theta and beta activity.  The patient goes into a light sleep, for which she remains throughout the majority of the tracing with symmetrical sleep spindles and vertex central sharp transients noted superimposed on a poorly organized slow background.  Hypoventilation was not performed. Intermittent photic stimulation elicits a symmetrical driving response, but failed to elicit any abnormalities.  IMPRESSION:  This is a normal EEG.          ______________________________ Thana Farr, MD    [id number] D:  04/18/2011 16:04:14  T:  04/18/2011 21:18:51  Job #:  [id number]

## 2011-04-18 NOTE — Progress Notes (Addendum)
Family Medicine Teaching Service [facility] Progress Note  Patient name: [name] Medical record number: [id number] Date of birth: 10-Feb-1991 Age: 20 y.o. Gender: female    LOS: 1 day   Primary Care Provider: [name], MD  Overnight Events:  Pt complains of emesis  x1 overnight after eating 12 chicken wings. Nauseas this am w/ breakfast. Pain much improved this morning . No complaints of hearing voices today. No SI. Afebrile   Objective: Vital signs in last 24 hours: Temp:  [97.8 F (36.6 C)-98.5 F (36.9 C)] 98.1 F (36.7 C) (12/03 0503) Pulse Rate:  [84-119] 88  (12/03 0503) Resp:  [11-23] 16  (12/03 0801) BP: (81-126)/(51-80) 91/56 mmHg (12/03 0503) SpO2:  [95 %-100 %] 99 % (12/03 0801) Weight:  [165 lb 5.5 oz (75 kg)-167 lb 8.8 oz (76 kg)] 167 lb 8.8 oz (76 kg) (12/02 2024)  Wt Readings from Last 3 Encounters:  04/17/11 167 lb 8.8 oz (76 kg) (91.23%*)  03/30/11 168 lb (76.204 kg) (91.45%*)  03/19/11 168 lb (76.204 kg) (91.48%*)   * Growth percentiles are based on CDC 2-20 Years data.      Current Facility-Administered Medications  Medication Dose Route Frequency Provider Last Rate Last Dose  . 0.45 % sodium chloride infusion   Intravenous Continuous Rachel Spiegel 100 mL/hr at 04/17/11 1600    . buPROPion (WELLBUTRIN XL) 24 hr tablet 150 mg  150 mg Oral Daily Rachel Spiegel   150 mg at 04/17/11 1821  . cyclobenzaprine (FLEXERIL) tablet 5 mg  5 mg Oral TID PRN [name]      . diphenhydrAMINE (BENADRYL) injection 12.5 mg  12.5 mg Intravenous Q6H PRN [name]       Or  . diphenhydrAMINE (BENADRYL) 12.5 MG/5ML elixir 12.5 mg  12.5 mg Oral Q6H PRN [name]      . docusate sodium (COLACE) capsule 100 mg  100 mg Oral BID Rachel Spiegel   100 mg at 04/17/11 2248  . folic acid (FOLVITE) tablet 1 mg  1 mg Oral Daily Rachel Spiegel   1 mg at 04/17/11 1821  . heparin injection 5,000 Units  5,000 Units Subcutaneous Q8H [name]      .  HYDROmorphone (DILAUDID) injection 2 mg  2 mg Intravenous Once Tatyana A Kirichenko, PA   2 mg at 04/17/11 1132  . ibuprofen (ADVIL,MOTRIN) tablet 600 mg  600 mg Oral TID [name]   600 mg at 04/17/11 2249  . LORazepam (ATIVAN) injection 0.5 mg  0.5 mg Intravenous Once Tatyana A Kirichenko, PA   0.5 mg at 04/17/11 1049  . LORazepam (ATIVAN) injection 1 mg  1 mg Intravenous Once Nelia Shi, MD   1 mg at 04/17/11 1146  . metroNIDAZOLE (FLAGYL) tablet 500 mg  500 mg Oral Q12H Rachel Spiegel   500 mg at 04/17/11 2248  . morphine 1 MG/ML PCA injection   Intravenous Q4H [name]      . morphine 2 MG/ML injection 2 mg  2 mg Intravenous Once [name]   2 mg at 04/17/11 1615  . morphine 2 MG/ML injection        2 mg at 04/17/11 1615  . naloxone [facility]) injection 0.4 mg  0.4 mg Intravenous PRN [name]       And  . sodium chloride 0.9 % injection 9 mL  9 mL Intravenous PRN [name]      . ondansetron (ZOFRAN) injection 4 mg  4 mg Intravenous Once [employer]  A Kirichenko, PA   4 mg at 04/17/11 1130  . ondansetron (ZOFRAN) injection 4 mg  4 mg Intravenous Q6H PRN [name]      . polyethylene glycol powder (GLYCOLAX/MIRALAX) container 17 g  17 g Oral BID PRN [name]      . DISCONTD: dextrose 5 %-0.45 % sodium chloride infusion   Intravenous Continuous Tatyana A Kirichenko, PA 50 mL/hr at 04/17/11 1154    . DISCONTD: heparin injection 5,000 Units  5,000 Units Subcutaneous Q8H [name]         PE: Gen: No acute distress, sleeping but arousable, well-nourished well-developed HEENT: Moist because numerous, no cervical lymphadenopathy CV: Regular rate and rhythm, no murmurs rubs or gallops Res: Clear to auscultation bilaterally, normal effort Abd: Normal active bowel sounds, nonpainful to palpation Ext/Musc: 2+ pulses in all extremities, no edema Neuro: Cranial nerves grossly intact, Psych: Does not endorse hearing voices today. No  SI.  Labs/Studies:   Basic Metabolic Panel:    Component Value Date/Time   NA 139 04/18/2011 0600   K 3.3* 04/18/2011 0600   CL 106 04/18/2011 0600   CO2 23 04/18/2011 0600   BUN 6 04/18/2011 0600   CREATININE 0.59 04/18/2011 0600   GLUCOSE 84 04/18/2011 0600   CALCIUM 8.4 04/18/2011 0600   CBC:    Component Value Date/Time   WBC 13.9* 04/18/2011 0600   HGB 8.5* 04/18/2011 0600   HCT 24.3* 04/18/2011 0600   PLT 523* 04/18/2011 0600   MCV 85.6 04/18/2011 0600   NEUTROABS 8.6* 03/19/2011 0534   LYMPHSABS 3.9 03/19/2011 0534   MONOABS 1.7* 03/19/2011 0534   EOSABS 0.6 03/19/2011 0534   BASOSABS 0.2* 03/19/2011 0534       Assessment/Plan:Edrie D Totten is a 20 y.o. year old female presenting with possible sickle cell pain crisis and depression.   1. Sickle cell vaso-occlusive crisis:  Pain much improved this morning. Morphine only once this morning at 0800, and no Motrin. Will discuss DC PCA w/ team today, due to good pain control. Will continue IV fluids. Will continue to monitor. Pain also likely due to muscle spasm as pt pain improved on flexiril, motrin,  and heating pad.  CBC with elevated white count, likely from sickle cell crisis. Afebrile    2. Depression- pt with significant PMH of mild to moderate depression. No psychotic features this morning. Will hold off on Psych consult unless pt again complains of hearing voices.   3. Vaginal discharge- wet prep concerning for BV, Gc/C pending. Will continue with flagyl.   4. Tobacco abuse- Discussed smoking cessation at time of admission. Will provide nicotine patch if pt requests it.   5. trichtillomania- chronic, pt has pulled hair of head, face and pubic area   6. Seizure like activity- Pt with no residual effects, received ativan x 1, neuro to see pt for further evaulation.   7. FEN/GI: regular diet.   8. Prophylaxis: heparin 5000 Units SQ   9. Disposition: pending clinical improvement   Signed: Shelly Flatten, MD Family  Medicine Resident PGY-1 04/18/2011 10:18 AM

## 2011-04-18 NOTE — Progress Notes (Signed)
Clinical Child psychotherapist (CSW) completed psychosocial assessment which can be found in pt shadow chart. Pt boyfriend present during assessment. Pt denies SI/HI and psychosis. Pt did report that she has had passive thoughts of "how would my life be without me here," but denies any plan or intent and reports telling boyfriend when thoughts have come. Pt stated that she has dealt with depression since she was in middle school and states depression came from being made fun of in school and her difficult relationship with mother and step father. Pt states she has a history of pulling her hair do to anxiety but no longer pulls like before. CSW observed that pt appears to have Trichotillomania which may or may not have been diagnosed. CSW observed bald spots on pt head and noticed that pt tried to reframe from hair pulling during assessment. CSW provided pt with other ways to handle anxiety. Pt and bf stated that they would like to have a baby and have recently had a miscarriage. CSW informed pt and bf of risks associated with having a child before being emotionally ready. Both stated they feel like child will fill pt void however CSW informed them that child will not solve problems however can bring upon additional stress if pt has not learned how to manage current stressors. Both were receptive and have agreed to discuss outpatient counseling. CSW provided pt with her contact information and will see pt at the outpatient clinic for short term counseling and assist pt in setting up long term counseling for her and bf.  Theresia Bough, MSW, [employer] [phone]

## 2011-04-18 NOTE — Progress Notes (Signed)
EEG performed.   Markus Jarvis 04/18/2011, 10:00 AM

## 2011-04-19 MED ORDER — PRENATAL RX 60-1 MG PO TABS: 1.0000 | ORAL_TABLET | Freq: Every day | ORAL | Status: DC

## 2011-04-19 MED ORDER — LIDOCAINE HCL (PF) 1 % IJ SOLN
5.0000 mL | Freq: Once | INTRAMUSCULAR | Status: DC
  Filled 2011-04-19: qty 5

## 2011-04-19 MED ORDER — CYCLOBENZAPRINE HCL 5 MG PO TABS: 5.0000 mg | ORAL_TABLET | Freq: Three times a day (TID) | ORAL | Status: AC | PRN

## 2011-04-19 MED ORDER — METRONIDAZOLE 500 MG PO TABS: 500.0000 mg | ORAL_TABLET | Freq: Two times a day (BID) | ORAL | Status: DC

## 2011-04-19 MED ORDER — MEDROXYPROGESTERONE ACETATE 150 MG/ML IM SUSP
150.0000 mg | Freq: Once | INTRAMUSCULAR | Status: AC
  Administered 2011-04-19: 150 mg via INTRAMUSCULAR
  Filled 2011-04-19 (×2): qty 1

## 2011-04-19 NOTE — H&P (Signed)
FMTS Attending Admission Note: Denny Levy MD [phone] pager office [phone] On 04/18/2011 I performed the following care, but forgot to sign her note until now. I  saw and examined this patient while she was still in the ED, reviewed their chart. I have discussed this patient with the resident. I agree with the resident's findings, assessment and care plan.

## 2011-04-19 NOTE — Discharge Summary (Signed)
Family Medicine Teaching Service Resident Discharge Summary  Patient ID: [name] [id number] 20 y.o. 08-06-1990  Admit date: 04/17/2011  Discharge date: 04/19/11  Admitting Physician: Denny Levy, MD   Discharge Physician: Oda Cogan, MD  Admission Diagnoses: Sickle cell crisis [282.62], possible seizure  Discharge Diagnoses: Sickle cell pain crisis, Muscle spasm, BV   Admission Condition: fair  Discharged Condition: good  Indication for Admission: Sickle cell pain crisis with multiple recent hospitalizations for similar diagnoses.  Hospital Course:    Sickle Cell Pain Crisis: Known sickle cell pt w/ most recent pain crisis admission 1 month ago, presenting w/ similar simptoms at time of admission. Pt started on Dilauded PCA and scheduled ibuprofen w/ IVF. In addition to diffuse back pain, pt complained of musculoskeletal type pain near her scapula. Pt given flexeril w/ some relief of pain. Pts pain was well controlled and was quickly weaned to PO pain medications. Pt was virtually pain free at time of DC.   BV:  Pt complained of vaginal irritation and was found to have BV. Treated throughout hospitalization and DCd w/ metronidazole.  Seizure: Questionable seizure witnessed in ED. CT scan and EEG were both normal. Neurology consulted and felt her episode was more consistent w/ syncope related to pain. No need for further intervention.   Reproductive: Recent miscarriage 2 months ago. UPT negative and GC/Chlamydia negative.  Pt agreeable to Depo-Provera today prior to DC. Pt prescribed prenatal vitamin and will plan to try getting pregnant again in 3 months. Pt feels a baby would fill the void she feels in her life. CSW and inpt team discussed the seriousness of pregnancy and child rearing especially w/ respect to the emotional, physical, and mental strains associated w/ it.   Consults: Neurology and Social workd  Significant Diagnostic Studies:  UPT:  Negative GC/Chlamydia: Negative Wet Prep: Clue cells Head CT: Normal CXR: unremarkable  Treatments: IV hydration and antibiotics: metronidazole  Discharge Exam: Gen: No acute distress, sleeping but arousable, well-nourished well-developed  HEENT: Moist mucous membranes, no cervical lymphadenopathy  CV: Regular rate and rhythm, no murmurs rubs or gallops  Res: Clear to auscultation bilaterally, normal effort  Abd: Normal active bowel sounds, nonpainful to palpation  Ext/Musc: 2+ pulses in all extremities, no edema  Neuro: Cranial nerves grossly intact,  Psych: Does not endorse hearing voices today. No SI.   Disposition: home  Patient Instructions:   Nancy, [name]  Home Medication Instructions [id number]   Printed on:04/19/11 1559  Medication Information                    polyethylene glycol powder (GLYCOLAX/MIRALAX) powder Take 17 g by mouth 2 (two) times daily as needed. For constipation           buPROPion (WELLBUTRIN XL) 150 MG 24 hr tablet Take 1 tablet (150 mg total) by mouth daily.           oxyCODONE-acetaminophen (PERCOCET) 5-325 MG per tablet Take 1 tablet by mouth every 4 (four) hours as needed. For pain.            Oxycodone HCl 10 MG TABS Take 10 mg by mouth every 6 (six) hours as needed. For pain.            cyclobenzaprine (FLEXERIL) 5 MG tablet Take 1 tablet (5 mg total) by mouth 3 (three) times daily as needed for muscle spasms.           Prenatal Vit-Fe Fumarate-FA (PRENATAL MULTIVITAMIN) 60-1 MG  tablet Take 1 tablet by mouth daily.           metroNIDAZOLE (FLAGYL) 500 MG tablet Take 1 tablet (500 mg total) by mouth every 12 (twelve) hours.             Activity: activity as tolerated Diet: regular diet Wound Care: none needed  Follow-up with Dr. Edmonia James on Thursday at 9:45  If muscle spasm pain persists, consider trigger point injection  Signed: Shelly Flatten, MD Family Medicine Resident PGY-1 04/19/2011 3:59 PM

## 2011-04-19 NOTE — Progress Notes (Signed)
Family Medicine Teaching Service Attending Note  I interviewed and examined patient [name] and reviewed their tests and x-rays.  I discussed with Dr. Konrad Dolores and reviewed their note for today.  I agree with their assessment and plan.     She is feeling well except for focal back pain.  Agree with trigger point injection.  This is not a sickle cell crisis  Advised her to refrain from pregnancy until taking prenatal vitamins regularly for 3 months  Close follow up in the [facility]

## 2011-04-19 NOTE — Progress Notes (Signed)
Subjective: Pt is 20 year old female with history of sickle cell disease and 1 incident of witness jerking with syncope in the ED upon presentation. She has had no repeat incidents of jerking movements. She has had no repeat incidents of syncope during hospital course. She states she is having no numbness, weakness, change in vision, falling, vertigo, dizziness. She is having some mild back pain but this is improved from initial presentation. No current complaints. Objective: Vital signs in last 24 hours: Filed Vitals:   04/18/11 1424 04/18/11 1735 04/18/11 2237 04/19/11 0655  BP: 98/64 99/59 160/76 90/65  Pulse: 90 90 85 84  Temp: 98.4 F (36.9 C) 98.1 F (36.7 C) 98.6 F (37 C) 98.2 F (36.8 C)  TempSrc: Oral Oral Oral Oral  Resp: 20 18 18 20   Height:      Weight:   173 lb 8 oz (78.7 kg)   SpO2: 95% 97% 95% 98%   Physical Exam: Neurologic Examination:  Mental Status:  Alert, oriented, thought content appropriate. Speech fluent without evidence of aphasia. Able to follow 3 step commands without difficulty.  Cranial Nerves:  II: visual fields grossly normal, pupils equal, round, reactive to light and accommodation  III,IV, VI: ptosis not present, extraocular muscles extra-ocular motions intact bilaterally  V,VII: smile symmetric, facial light touch sensation normal bilaterally  VIII: hearing normal bilaterally  IX,X: gag reflex present  XI: trapezius strength/neck flexion strength normal bilaterally  XII: tongue strength normal  Motor:  Right : Upper extremity 5/5 Left: Upper extremity 5/5  Lower extremity 5/5 Lower extremity 5/5  Tone and bulk:normal tone throughout; no atrophy noted  Sensory: Pinprick and light touch intact throughout, bilaterally  Deep Tendon Reflexes: 2+ and symmetric throughout  Plantars:  Right: downgoing Left: downgoing  Cerebellar:  normal finger-to-nose and normal heel-to-shin test  Lab Results: Basic Metabolic Panel:  Lab 04/18/11 1610 04/17/11  1351 04/17/11 0908  NA 139 -- 139  K 3.3* -- 3.4*  CL 106 -- 108  CO2 23 -- 23  GLUCOSE 84 -- 111*  BUN 6 -- 5*  CREATININE 0.59 0.52 --  CALCIUM 8.4 -- 9.0  MG -- -- --  PHOS -- -- --   Liver Function Tests:  Lab 04/17/11 0908  AST 15  ALT 10  ALKPHOS 56  BILITOT 1.8*  PROT 7.1  ALBUMIN 3.8   CBC:  Lab 04/18/11 0600 04/17/11 0908  WBC 13.9* 13.5*  NEUTROABS -- --  HGB 8.5* 9.4*  HCT 24.3* 26.9*  MCV 85.6 84.9  PLT 523* 531*     Lab 04/17/11 0908  VITAMINB12 --  FOLATE --  FERRITIN --  TIBC --  IRON --  RETICCTPCT 15.0*   Urine Drug Screen: Drugs of Abuse     Component Value Date/Time   LABOPIA NONE DETECTED 04/17/2011 1014   COCAINSCRNUR NONE DETECTED 04/17/2011 1014   LABBENZ NONE DETECTED 04/17/2011 1014   AMPHETMU NONE DETECTED 04/17/2011 1014   THCU POSITIVE* 04/17/2011 1014   LABBARB NONE DETECTED 04/17/2011 1014    Micro Results: Recent Results (from the past 240 hour(s))  WET PREP, GENITAL     Status: Abnormal   Collection Time   04/17/11 12:37 PM      Component Value Range Status Comment   Yeast, Wet Prep NONE SEEN  NONE SEEN  Final    Trich, Wet Prep NONE SEEN  NONE SEEN  Final    Clue Cells, Wet Prep MANY (*) NONE SEEN  Final  WBC, Wet Prep HPF POC RARE (*) NONE SEEN  Final    Studies/Results: Dg Chest 2 View  04/17/2011  *RADIOLOGY REPORT*  Clinical Data: Chest pain  CHEST - 2 VIEW  Comparison: 03/31/2011  Findings: Heart is upper limits normal in size.  Lungs are clear. No effusions.  No acute bony abnormality.  Mild peribronchial thickening.  Right upper lobe atelectasis or scarring.  IMPRESSION: Suspect bronchitic changes.  Original Report Authenticated By: Cyndie Chime, M.D.   Ct Head Wo Contrast  04/17/2011  *RADIOLOGY REPORT*  Clinical Data: Sickle cell pain, crisis.  CT HEAD WITHOUT CONTRAST  Technique:  Contiguous axial images were obtained from the base of the skull through the vertex without contrast.  Comparison: 08/14/2007   Findings: No acute intracranial abnormality.  Specifically, no hemorrhage, hydrocephalus, mass lesion, acute infarction, or significant intracranial injury.  No acute calvarial abnormality. Visualized paranasal sinuses and mastoids clear.  Orbital soft tissues unremarkable.  IMPRESSION: Normal study.  Original Report Authenticated By: Cyndie Chime, M.D.   Medications: I have reviewed the patient's current medications. Scheduled Meds:   . buPROPion  150 mg Oral Daily  . docusate sodium  100 mg Oral BID  . folic acid  1 mg Oral Daily  . heparin  5,000 Units Subcutaneous Q8H  . ibuprofen  600 mg Oral TID  . metroNIDAZOLE  500 mg Oral Q12H  . DISCONTD: morphine   Intravenous Q4H   Continuous Infusions:   . sodium chloride 100 mL/hr at 04/17/11 1600   PRN Meds:.cyclobenzaprine, diphenhydrAMINE, diphenhydrAMINE, naloxone, ondansetron (ZOFRAN) IV, oxyCODONE-acetaminophen, polyethylene glycol powder, sodium chloride Assessment/Plan: 1. Syncope -Syncopal episode likely related to pain.  EEG and imaging unremarkable.  Seizure less likely despite being on Wellbutrin.  Has not had recurrent symptoms.  Plan: 1. No AED's at the time. May continue Wellbutrin. Would recommend careful titration of dosing if plans in future to increase dose.  2. No further neurologic intervention is recommended at this time.  If further questions arise, please call or page at that time.  Thank you for allowing neurology to participate in the care of this patient.   LOS: 2 days   Genella Mech 04/19/2011, 9:30 AM  Thana Farr, MD [facility] [phone] 04/19/2011  11:18 AM

## 2011-04-19 NOTE — Progress Notes (Addendum)
Family Medicine Teaching Service [facility] Progress Note  Patient name: [name] Medical record number: [id number] Date of birth: 1990/06/17 Age: 20 y.o. Gender: female    LOS: 2 days   Primary Care Provider: [name], MD  Overnight Events:  Patient did well overnight. No complaints this morning. Pain is much improved. Patient slept through the night without difficulty. Patient states "I'm ready to go home ".   ROS: Denies nausea vomiting diarrhea, chest pain, shortness breath  Objective: Vital signs in last 24 hours: Temp:  [98.1 F (36.7 C)-98.6 F (37 C)] 98.2 F (36.8 C) (12/04 0655) Pulse Rate:  [84-90] 84  (12/04 0655) Resp:  [18-20] 20  (12/04 0655) BP: (90-160)/(59-76) 90/65 mmHg (12/04 0655) SpO2:  [94 %-98 %] 98 % (12/04 0655) Weight:  [173 lb 8 oz (78.7 kg)] 173 lb 8 oz (78.7 kg) (12/03 2237)  Wt Readings from Last 3 Encounters:  04/18/11 173 lb 8 oz (78.7 kg) (93.14%*)  03/30/11 168 lb (76.204 kg) (91.45%*)  03/19/11 168 lb (76.204 kg) (91.48%*)   * Growth percentiles are based on CDC 2-20 Years data.      Current Facility-Administered Medications  Medication Dose Route Frequency Provider Last Rate Last Dose  . 0.45 % sodium chloride infusion   Intravenous Continuous Rachel Spiegel 100 mL/hr at 04/17/11 1600    . buPROPion (WELLBUTRIN XL) 24 hr tablet 150 mg  150 mg Oral Daily Rachel Spiegel   150 mg at 04/18/11 1211  . cyclobenzaprine (FLEXERIL) tablet 5 mg  5 mg Oral TID PRN [name]      . diphenhydrAMINE (BENADRYL) injection 12.5 mg  12.5 mg Intravenous Q6H PRN [name]       Or  . diphenhydrAMINE (BENADRYL) 12.5 MG/5ML elixir 12.5 mg  12.5 mg Oral Q6H PRN [name]      . docusate sodium (COLACE) capsule 100 mg  100 mg Oral BID Rachel Spiegel   100 mg at 04/18/11 2339  . folic acid (FOLVITE) tablet 1 mg  1 mg Oral Daily Rachel Spiegel   1 mg at 04/18/11 1211  . heparin injection 5,000 Units  5,000 Units Subcutaneous Q8H Rachel  Spiegel   5,000 Units at 04/18/11 1700  . ibuprofen (ADVIL,MOTRIN) tablet 600 mg  600 mg Oral TID [name]   600 mg at 04/18/11 2339  . metroNIDAZOLE (FLAGYL) tablet 500 mg  500 mg Oral Q12H Rachel Spiegel   500 mg at 04/18/11 2340  . naloxone [facility]) injection 0.4 mg  0.4 mg Intravenous PRN [name]       And  . sodium chloride 0.9 % injection 9 mL  9 mL Intravenous PRN [name]      . ondansetron (ZOFRAN) injection 4 mg  4 mg Intravenous Q6H PRN [name]      . oxyCODONE-acetaminophen (PERCOCET) 5-325 MG per tablet 1-2 tablet  1-2 tablet Oral Q3H PRN Ralonda Tartt   1 tablet at 04/18/11 2117  . polyethylene glycol powder (GLYCOLAX/MIRALAX) container 17 g  17 g Oral BID PRN [name]      . DISCONTD: morphine 1 MG/ML PCA injection   Intravenous Q4H [name]         PE: Gen: No acute distress, sleeping but arousable, well-nourished well-developed HEENT: Moist mucous membranes, no cervical lymphadenopathy CV: Regular rate and rhythm, no murmurs rubs or gallops Res: Clear to auscultation bilaterally, normal effort Abd: Normal active bowel sounds, nonpainful to palpation Ext/Musc: 2+ pulses in all extremities, no  edema Neuro: Cranial nerves grossly intact, Psych: Does not endorse hearing voices today. No SI.  Labs/Studies:  none    Assessment/Plan:  [name] is a 20 y.o. year old female presenting with resolving possible sickle cell pain crisis and depression.   1. Sickle cell vaso-occlusive crisis:  Pain much improved this morning. Morphine changed to Percocet yesterday. Last Percocet at 2100 last night. Last Motrin dose at midnight last night. Will continue IV fluids. Will continue to monitor. Pain also likely due to muscle spasm as pt pain improved on flexiril, motrin,  and heating pad.      2. Depression- pt with significant PMH of mild to moderate depression. No psychotic features this morning. CSW met w/ pt yesterday and will f/u as  outpt.   3. Vaginal discharge- wet prep concerning for BV, Gc/C negative. Will continue with flagyl.   4. Tobacco abuse- Discussed smoking cessation at time of admission. Will provide nicotine patch if pt requests it.   5. Trichtillomania- chronic, pt has pulled hair of head, face and pubic area.  6. Seizure like activity- Pt with no residual effects, received ativan x 1, neuro to see pt for further evaulation. Normal EEG. No further interventions necessary at this time  7.  Reproductive - pregnancy and child bearing discussed w/ pt by CSW and myself. Pt does not want any contraception. Pt wants to become pregnant. Made aware of emotional, financial, and physical challenges associated w/ having a child. UPT, GC, Chlamydia negative during this admission  8. FEN/GI: regular diet.   9. Prophylaxis: heparin 5000 Units SQ,  10. Disposition: Likely DC today   Signed: MERRELL, DAVID, MD Family Medicine Resident PGY-1 04/19/2011 8:59 AM

## 2011-04-20 ENCOUNTER — Other Ambulatory Visit: Payer: Self-pay

## 2011-04-20 ENCOUNTER — Emergency Department (HOSPITAL_COMMUNITY): Payer: Medicaid Other

## 2011-04-20 ENCOUNTER — Encounter (HOSPITAL_COMMUNITY): Payer: Self-pay | Admitting: Emergency Medicine

## 2011-04-20 ENCOUNTER — Emergency Department (HOSPITAL_COMMUNITY)
Admission: EM | Admit: 2011-04-20 | Discharge: 2011-04-20 | Disposition: A | Discharge status: Home / Self Care | Payer: Medicaid Other | Attending: Emergency Medicine | Admitting: Emergency Medicine

## 2011-04-20 ENCOUNTER — Ambulatory Visit: Payer: Medicaid Other | Admitting: Family Medicine

## 2011-04-20 DIAGNOSIS — Z79899 Other long term (current) drug therapy: Secondary | ICD-10-CM | POA: Insufficient documentation

## 2011-04-20 DIAGNOSIS — M549 Dorsalgia, unspecified: Secondary | ICD-10-CM

## 2011-04-20 DIAGNOSIS — R0602 Shortness of breath: Secondary | ICD-10-CM | POA: Insufficient documentation

## 2011-04-20 DIAGNOSIS — K219 Gastro-esophageal reflux disease without esophagitis: Secondary | ICD-10-CM | POA: Insufficient documentation

## 2011-04-20 DIAGNOSIS — R5383 Other fatigue: Secondary | ICD-10-CM | POA: Insufficient documentation

## 2011-04-20 DIAGNOSIS — M546 Pain in thoracic spine: Secondary | ICD-10-CM | POA: Insufficient documentation

## 2011-04-20 DIAGNOSIS — D571 Sickle-cell disease without crisis: Secondary | ICD-10-CM | POA: Insufficient documentation

## 2011-04-20 DIAGNOSIS — M2550 Pain in unspecified joint: Secondary | ICD-10-CM | POA: Insufficient documentation

## 2011-04-20 DIAGNOSIS — R5381 Other malaise: Secondary | ICD-10-CM | POA: Insufficient documentation

## 2011-04-20 DIAGNOSIS — R079 Chest pain, unspecified: Secondary | ICD-10-CM | POA: Insufficient documentation

## 2011-04-20 LAB — DIFFERENTIAL
Basophils Absolute: 0 10*3/uL (ref 0.0–0.1)
Basophils Relative: 0 % (ref 0–1)
Lymphocytes Relative: 17 % (ref 12–46)
Monocytes Relative: 10 % (ref 3–12)
Neutro Abs: 12.8 10*3/uL — ABNORMAL HIGH (ref 1.7–7.7)
Neutrophils Relative %: 69 % (ref 43–77)

## 2011-04-20 LAB — COMPREHENSIVE METABOLIC PANEL
Albumin: 4.1 g/dL (ref 3.5–5.2)
Alkaline Phosphatase: 70 U/L (ref 39–117)
BUN: 12 mg/dL (ref 6–23)
Calcium: 9.3 mg/dL (ref 8.4–10.5)
GFR calc Af Amer: >90 mL/min (ref >90)
Glucose, Bld: 88 mg/dL (ref 70–99)
Potassium: 3.9 mEq/L (ref 3.5–5.1)
Sodium: 136 mEq/L (ref 135–145)
Total Protein: 7.2 g/dL (ref 6.0–8.3)

## 2011-04-20 LAB — CBC
HCT: 25.5 % — ABNORMAL LOW (ref 36.0–46.0)
Hemoglobin: 9 g/dL — ABNORMAL LOW (ref 12.0–15.0)
WBC: 18.4 10*3/uL — ABNORMAL HIGH (ref 4.0–10.5)

## 2011-04-20 LAB — RETICULOCYTES
RBC.: 3.01 MIL/uL — ABNORMAL LOW (ref 3.87–5.11)
Retic Ct Pct: 17.5 % — ABNORMAL HIGH (ref 0.4–3.1)

## 2011-04-20 MED ORDER — ONDANSETRON HCL 4 MG/2ML IJ SOLN
4.0000 mg | Freq: Once | INTRAMUSCULAR | Status: AC
  Administered 2011-04-20: 4 mg via INTRAVENOUS
  Filled 2011-04-20: qty 2

## 2011-04-20 MED ORDER — HYDROMORPHONE HCL PF 1 MG/ML IJ SOLN
1.0000 mg | Freq: Once | INTRAMUSCULAR | Status: AC
  Administered 2011-04-20: 1 mg via INTRAVENOUS
  Filled 2011-04-20: qty 1

## 2011-04-20 MED ORDER — SODIUM CHLORIDE 0.9 % IV BOLUS (SEPSIS)
1000.0000 mL | Freq: Once | INTRAVENOUS | Status: AC
  Administered 2011-04-20: 1000 mL via INTRAVENOUS

## 2011-04-20 MED ORDER — DEXTROSE-NACL 5-0.45 % IV SOLN
INTRAVENOUS | Status: DC
  Administered 2011-04-20: 08:00:00 via INTRAVENOUS

## 2011-04-20 NOTE — ED Notes (Signed)
PT. REPORTS CHEST PAIN AND UPPER BACK PAIN ONSET YESTERDAY , SOB , DRY COUGH .

## 2011-04-20 NOTE — Discharge Summary (Signed)
I interviewed and examined patient and reviewed pertinent labs and tests on the day of discharge. I reviewed this discharge summary and agree

## 2011-04-20 NOTE — Consult Note (Signed)
Family Medicine Teaching [facility] Admission History and Physical  Patient name: [name] Medical record number: [id number] Date of birth: May 22, 1990 Age: 20 y.o. Gender: female  Primary Care Provider: [name], MD  Chief Complaint: back pain, right rib pain  History of Present Illness: [name] is a 20 y.o. year old female presenting with  back pain and right rib pain. Patient states that she was watching a movie at 4 AM and was talking with her boyfriend about the relationship within all of a sudden she began to have intense pain in her right rib area and in her back. She felt weak all over She states she got short of breath. And told her boyfriend, "I feel like I'm going to die."  Symptoms improved the patient felt she should come to the ER for evaluation. Patient discharged yesterday from inpatient service status post pain crisis and musculoskeletal pain.  Patient did not get her prescription filled for Flexeril. During assessment a patient patient spoke repeatedly of the stress in her life. States that she is not doing well in school. She has missed a lot of work and they're cutting back her hours. There are a lot of financial concerns and her relationship with her boyfriend. Her and her boyfriend fight about financial issues as well as "trust issues". Patient states that she is ready to take control of her life. Patient has not seen her hematologist in over one year. Has no showed to multiple appointments. Patient has no showed to multiple PCP appointment.  Patient Active Problem List  Diagnoses  . SICKLE CELL ANEMIA  . TRICHOTILLOMANIA  . DRY SKIN  . Active smoker  . Back pain  . Depression  . GERD (gastroesophageal reflux disease)  . Contraception management  . Sickle-cell disease with vaso-occlusive pain   Past Medical History: Past Medical History  Diagnosis Date  . Sickle cell anemia with crisis   . Sickle cell anemia   . H/O: 1 miscarriage  03/22/2011  . SICKLE CELL ANEMIA 01/08/2009  . 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 Active smoker 08/09/2010  . Depression 01/06/2011  . GERD (gastroesophageal reflux disease) 02/17/2011  . Sickle cell anemia     pf has frequent sickle cell crisis  . Sickle cell anemia     Past Surgical History: Past Surgical History  Procedure Date  . Cholecystectomy   . Choley     Social History: History   Social History  . Marital Status: Single    Spouse Name: N/A    Number of Children: N/A  . Years of Education: N/A   Social History Main Topics  . Smoking status: Current Everyday Smoker    Types: Cigars  . Smokeless tobacco: Never Used  . Alcohol Use: No  . Drug Use: Yes     marijuana  . Sexually Active: None     Family History: noncontributory  Allergies: Allergies  Allergen Reactions  . Toradol Hives and Itching    Some itching, give with benedryl    Current Outpatient Prescriptions  Medication Sig Dispense Refill  . buPROPion (WELLBUTRIN XL) 150 MG 24 hr tablet Take 1 tablet (150 mg total) by mouth daily.  30 tablet  1  . cyclobenzaprine (FLEXERIL) 5 MG tablet Take 1 tablet (5 mg total) by mouth 3 (three) times daily as needed for muscle spasms.  30 tablet  0  . metroNIDAZOLE (FLAGYL) 500 MG tablet Take 1 tablet (500 mg total) by mouth every 12 (twelve) hours.  10 tablet  0  . Oxycodone HCl 10 MG TABS Take 10 mg by mouth every 6 (six) hours as needed. For pain.       Marland Kitchen oxyCODONE-acetaminophen (PERCOCET) 5-325 MG per tablet Take 1 tablet by mouth every 4 (four) hours as needed. For pain.       . polyethylene glycol powder (GLYCOLAX/MIRALAX) powder Take 17 g by mouth 2 (two) times daily as needed. For constipation      . Prenatal Vit-Fe Fumarate-FA (PRENATAL MULTIVITAMIN) 60-1 MG tablet Take 1 tablet by mouth daily.  90 tablet  1    Review Of Systems: as per above.no fevers. No chills.No cough.  Physical Exam: Pulse: 98  Blood Pressure: 10152 RR: 16   O2: 100 on ra Temp: 99.2    General: alert and cooperative HEENT: PERRLA, extra ocular movement intact, sclera clear, anicteric and + patches of hairloss (trichotillomania) Heart: S1, S2 normal, no murmur, rub or gallop, regular rate and rhythm Lungs: clear to auscultation, no wheezes or rales and unlabored breathing,  Abdomen: abdomen is soft without significant tenderness, masses, organomegaly or guarding Extremities: extremities normal, atraumatic, no cyanosis or edema, + point tenderness at right scapula and left scapula, some mild tenderness to palpation of lower back, mild tenderness to palpation in right rib area.  Skin:no rashes Neurology: normal without focal findings, mental status, speech normal, alert and oriented x3 and PERLA- strength 5/5 and equal in all 4 extremities  Labs and Imaging: Lab Results  Component Value Date/Time   NA 136 04/20/2011  6:00 AM   K 3.9 04/20/2011  6:00 AM   CL 104 04/20/2011  6:00 AM   CO2 24 04/20/2011  6:00 AM   BUN 12 04/20/2011  6:00 AM   CREATININE 0.57 04/20/2011  6:00 AM   GLUCOSE 88 04/20/2011  6:00 AM   Lab Results  Component Value Date   WBC 18.4* 04/20/2011   HGB 9.0* 04/20/2011   HCT 25.5* 04/20/2011   MCV 84.7 04/20/2011   PLT 580* 04/20/2011   retic count 17.5  AST- 18 ALT-11  CXR- no acute findings     Assessment and Plan: [name] is a 20 y.o. year old female presenting with backpain and right rib pain, also endorses increased stress in life currently.  1. Back pain and right rib pain:  This pain is not consistent with her normal sickle cell pain. Seems to be more musculoskeletal in origin. Patient to use Flexeril (needs to go get Rx filled) and oxycodone per home regimen. Patient to followup with me in office tomorrow. If pain is still persistent with trigger point tenderness in left and right scapular areas we'll consider doing trigger point injections   2. Stress reaction: Episode at 4 AM most likely a stress reaction/possible anxiety  attack. Patient to monitor symptoms will see patient tomorrow in office follow-up. Patient comes in detail about her life stressors as per history of present illness. Patient to call [facility] at [facility] to schedule therapy appointment. Normal can also discuss possible community Resources for patient.  But would like normal to focus mainly on helping patient with decreasing stress and working on organizing life and work.  3. Disposition: pt to follow up with Dr. Edmonia James tomorrow during scheduled appointment time.    Deloyd Handy S. Edmonia James, MD PGY- 3

## 2011-04-20 NOTE — ED Provider Notes (Signed)
History     CSN: [id number] Arrival date & time: 04/20/2011  5:33 AM   First MD Initiated Contact with Patient 04/20/11 [phone]      Chief Complaint  Patient presents with  . Chest Pain    (Consider location/radiation/quality/duration/timing/severity/associated sxs/prior treatment) Patient is a 20 y.o. female presenting with sickle cell pain. The history is provided by the patient.  Sickle Cell Pain Crisis  This is a new problem. The current episode started 5 to 7 days ago. The onset was sudden. The problem occurs continuously. The problem has been gradually worsening. The pain location is generalized. The pain is similar to prior episodes. The pain is severe. The symptoms are relieved by nothing. Associated symptoms include chest pain, back pain, joint pain and weakness. Pertinent negatives include no double vision, no abdominal pain, no nausea, no vomiting, no dysuria, no hematuria, no headaches, no sore throat, no swollen glands, no cough, no difficulty breathing, no rash and no eye pain.  Pt with hx of sickle cell disease, states having a crisis. Pain in chest, upper back. States was just discharged from the hospital yesterday afternoon, but feeling worse now. Did not take any pain medications prior to arrival. Denies fever, chills, headache, neck stiffness, abdominal pain, nausea, vomiting.  Past Medical History  Diagnosis Date  . Sickle cell anemia with crisis   . Sickle cell anemia   . H/O: 1 miscarriage 03/22/2011  . SICKLE CELL ANEMIA 01/08/2009  . 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 Active smoker 08/09/2010  . Depression 01/06/2011  . GERD (gastroesophageal reflux disease) 02/17/2011  . Sickle cell anemia     pf has frequent sickle cell crisis  . Sickle cell anemia     Past Surgical History  Procedure Date  . Cholecystectomy   . Choley     No family history on file.  History  Substance Use Topics  . Smoking status: Current Everyday Smoker    Types: Cigars  . Smokeless  tobacco: Never Used  . Alcohol Use: No    OB History    Grav Para Term Preterm Abortions TAB SAB Ect Mult Living   1               Review of Systems  Constitutional: Positive for fatigue. Negative for fever and chills.  HENT: Negative.  Negative for sore throat.   Eyes: Negative for double vision and pain.  Respiratory: Positive for chest tightness and shortness of breath. Negative for cough.   Cardiovascular: Positive for chest pain.  Gastrointestinal: Negative for nausea, vomiting and abdominal pain.  Genitourinary: Negative for dysuria, hematuria and pelvic pain.  Musculoskeletal: Positive for back pain and joint pain.  Skin: Negative.  Negative for rash.  Neurological: Positive for weakness. Negative for light-headedness and headaches.  Psychiatric/Behavioral: Negative.     Allergies  Toradol  Home Medications   Current Outpatient Rx  Name Route Sig Dispense Refill  . BUPROPION HCL ER (XL) 150 MG PO TB24 Oral Take 1 tablet (150 mg total) by mouth daily. 30 tablet 1  . CYCLOBENZAPRINE HCL 5 MG PO TABS Oral Take 1 tablet (5 mg total) by mouth 3 (three) times daily as needed for muscle spasms. 30 tablet 0  . METRONIDAZOLE 500 MG PO TABS Oral Take 1 tablet (500 mg total) by mouth every 12 (twelve) hours. 10 tablet 0  . OXYCODONE HCL 10 MG PO TABS Oral Take 10 mg by mouth every 6 (six) hours as needed. For pain.     Marland Kitchen  OXYCODONE-ACETAMINOPHEN 5-325 MG PO TABS Oral Take 1 tablet by mouth every 4 (four) hours as needed. For pain.     Marland Kitchen POLYETHYLENE GLYCOL 3350 PO POWD Oral Take 17 g by mouth 2 (two) times daily as needed. For constipation    . PRENATAL RX 60-1 MG PO TABS Oral Take 1 tablet by mouth daily. 90 tablet 1    BP 130/79  Pulse 98  Temp(Src) 97.7 F (36.5 C) (Oral)  Resp 20  SpO2 100%  LMP 03/26/2011  Physical Exam  Nursing note and vitals reviewed. Constitutional: She is oriented to person, place, and time. She appears well-developed and well-nourished.        Uncomfortable appearing  HENT:  Head: Normocephalic.  Eyes: Conjunctivae are normal. Pupils are equal, round, and reactive to light.  Neck: Neck supple.  Cardiovascular: Normal rate, regular rhythm and normal heart sounds.   Pulmonary/Chest: Effort normal and breath sounds normal. No respiratory distress. She has no wheezes. She exhibits tenderness.       Tender with palpation over bilateral chest.  Abdominal: Soft. Bowel sounds are normal. There is no tenderness.  Musculoskeletal: Normal range of motion.       Diffuse upper back tenderness with palpation. No bruising, swelling, step offs of the spine  Lymphadenopathy:    She has no cervical adenopathy.  Neurological: She is alert and oriented to person, place, and time.  Skin: Skin is warm and dry. No erythema.    ED Course  Procedures (including critical care time)  Pt discharged yesterday, sickle cell exacerbation. Will get labs. Pain meds ordered. Spoke with pt's PCP, family medicine, will come see pt.    Date: 04/20/2011  Rate: 93  Rhythm: normal sinus rhythm  QRS Axis: normal  Intervals: normal  ST/T Wave abnormalities: normal  Conduction Disutrbances:none  Narrative Interpretation:   Old EKG Reviewed: unchanged    Pt see and examined by her primary carte doctor. Plan to d/c home by them.  MDM          Lottie Mussel, PA 04/20/11 1732

## 2011-04-20 NOTE — ED Notes (Signed)
Patient complains of ongoing,  Additional pain meds given per orders.  Iv fluids changed as well

## 2011-04-20 NOTE — ED Notes (Signed)
Pt will be seeing Dr Edmonia James tomorrow and understands pain management until.

## 2011-04-21 ENCOUNTER — Ambulatory Visit (INDEPENDENT_AMBULATORY_CARE_PROVIDER_SITE_OTHER): Payer: Medicaid Other | Admitting: Family Medicine

## 2011-04-21 ENCOUNTER — Telehealth: Payer: Self-pay | Admitting: Family Medicine

## 2011-04-21 ENCOUNTER — Encounter: Payer: Self-pay | Admitting: Family Medicine

## 2011-04-21 DIAGNOSIS — Z309 Encounter for contraceptive management, unspecified: Secondary | ICD-10-CM

## 2011-04-21 DIAGNOSIS — Z733 Stress, not elsewhere classified: Secondary | ICD-10-CM

## 2011-04-21 DIAGNOSIS — D571 Sickle-cell disease without crisis: Secondary | ICD-10-CM

## 2011-04-21 DIAGNOSIS — F439 Reaction to severe stress, unspecified: Secondary | ICD-10-CM

## 2011-04-21 DIAGNOSIS — M549 Dorsalgia, unspecified: Secondary | ICD-10-CM

## 2011-04-21 MED ORDER — TRAMADOL HCL 50 MG PO TABS: 50.0000 mg | ORAL_TABLET | Freq: Four times a day (QID) | ORAL | Status: DC | PRN

## 2011-04-21 MED ORDER — OXYCODONE-ACETAMINOPHEN 5-325 MG PO TABS: 1.0000 | ORAL_TABLET | Freq: Four times a day (QID) | ORAL | Status: DC | PRN

## 2011-04-21 NOTE — Progress Notes (Signed)
  Subjective:    Patient ID: [name], female    DOB: 11-27-1990, 20 y.o.   MRN: [id number]  HPI Patient here for hospital followup for pain crisis:  Back pain: Patient states pain is improved. Currently 4/10. Located most in lower back and in right scapular area. Flexeril is helping a little bit.  Patient states she would like to go ahead with a trigger point injection.  Sickle cell:  Patient plan at time of discharge from hospital was to make a hematology appointment. Has not seen hematologist in over one year. No longer on hydroxyurea. Has had multiple sickle cell crises in the past year.pain medications refilled today.  Social concerns: Patient has made an appointment with Nelva Bush, clinical social worker, to help her with her life stressors. Patient states that her appointment is scheduled for next week.  Birth control: Was given Depo-Provera shot prior to discharge. Patient needs date that she is due for return for her second injection.   Review of Systems No fever. No increase in pain. No nausea. No vomiting. No rash.    Objective:   Physical Exam  Constitutional: She is oriented to person, place, and time. She appears well-developed and well-nourished.  HENT:  Head: Normocephalic and atraumatic.  Eyes: Pupils are equal, round, and reactive to light.  Cardiovascular: Normal rate, regular rhythm and normal heart sounds.   No murmur heard. Pulmonary/Chest: Effort normal and breath sounds normal. No respiratory distress. She has no wheezes.  Abdominal: Soft. She exhibits no distension.  Musculoskeletal: She exhibits no edema.  Neurological: She is alert and oriented to person, place, and time.  Skin: No rash noted.  Psychiatric: She has a normal mood and affect. Her behavior is normal.   Trigger point injection- right upper back: Risk and benefits explained.  Consent signed. Site cleaned. A trigger point injection was performed at the site of maximal tenderness using  1% plain Lidocaine. This was well tolerated, and followed by some mild relief of pain.       Assessment & Plan:

## 2011-04-21 NOTE — ED Provider Notes (Signed)
Medical screening examination/treatment/procedure(s) were performed by non-physician practitioner and as supervising physician I was immediately available for consultation/collaboration.  Hanley Seamen, MD 04/21/11 1555

## 2011-04-21 NOTE — Telephone Encounter (Signed)
Patient call the emergency line. She states that her back pain is no better. She dropped off her narcotic script to her normal [facility] but it was closed by the time she ws able to pick it up after work. She request another script be called in to the [facility] on [address]. I informed her that narcotics cannot be called or faxed in and it is against office policy to fill pain meds over the phone. She voiced understanding. She is taking flexeril w/o relief. I sent in 20 of tramadol to the [facility] pt will give it a  Try and pick up her narcotics in the AM.

## 2011-04-27 ENCOUNTER — Observation Stay (HOSPITAL_COMMUNITY)
Admission: EM | Admit: 2011-04-27 | Discharge: 2011-04-29 | Disposition: A | Discharge status: Home / Self Care | Payer: Medicaid Other | Source: Ambulatory Visit | Attending: Family Medicine | Admitting: Family Medicine

## 2011-04-27 ENCOUNTER — Encounter (HOSPITAL_COMMUNITY): Payer: Self-pay | Admitting: *Deleted

## 2011-04-27 DIAGNOSIS — D5700 Hb-SS disease with crisis, unspecified: Principal | ICD-10-CM | POA: Diagnosis present

## 2011-04-27 DIAGNOSIS — M549 Dorsalgia, unspecified: Secondary | ICD-10-CM | POA: Diagnosis present

## 2011-04-27 DIAGNOSIS — A499 Bacterial infection, unspecified: Secondary | ICD-10-CM | POA: Insufficient documentation

## 2011-04-27 DIAGNOSIS — M62838 Other muscle spasm: Secondary | ICD-10-CM

## 2011-04-27 DIAGNOSIS — D571 Sickle-cell disease without crisis: Secondary | ICD-10-CM

## 2011-04-27 DIAGNOSIS — B9689 Other specified bacterial agents as the cause of diseases classified elsewhere: Secondary | ICD-10-CM | POA: Insufficient documentation

## 2011-04-27 DIAGNOSIS — N760 Acute vaginitis: Secondary | ICD-10-CM | POA: Insufficient documentation

## 2011-04-27 DIAGNOSIS — F329 Major depressive disorder, single episode, unspecified: Secondary | ICD-10-CM | POA: Diagnosis present

## 2011-04-27 DIAGNOSIS — M62830 Muscle spasm of back: Secondary | ICD-10-CM | POA: Diagnosis present

## 2011-04-27 DIAGNOSIS — IMO0001 Reserved for inherently not codable concepts without codable children: Secondary | ICD-10-CM | POA: Insufficient documentation

## 2011-04-27 DIAGNOSIS — F321 Major depressive disorder, single episode, moderate: Secondary | ICD-10-CM

## 2011-04-27 HISTORY — DX: Trichotillomania: F63.3

## 2011-04-27 HISTORY — DX: Reserved for inherently not codable concepts without codable children: IMO0001

## 2011-04-27 HISTORY — DX: Encounter for other specified aftercare: Z51.89

## 2011-04-27 LAB — CBC
HCT: 23.1 % — ABNORMAL LOW (ref 36.0–46.0)
HCT: 23.3 % — ABNORMAL LOW (ref 36.0–46.0)
Hemoglobin: 7.9 g/dL — ABNORMAL LOW (ref 12.0–15.0)
Hemoglobin: 8.1 g/dL — ABNORMAL LOW (ref 12.0–15.0)
Hemoglobin: 8.3 g/dL — ABNORMAL LOW (ref 12.0–15.0)
MCHC: 35 g/dL (ref 30.0–36.0)
MCV: 86.2 fL (ref 78.0–100.0)
RBC: 2.79 MIL/uL — ABNORMAL LOW (ref 3.87–5.11)
RDW: 17.3 % — ABNORMAL HIGH (ref 11.5–15.5)
WBC: 15.8 10*3/uL — ABNORMAL HIGH (ref 4.0–10.5)
WBC: 15.7 10*3/uL — ABNORMAL HIGH (ref 4.0–10.5)

## 2011-04-27 LAB — COMPREHENSIVE METABOLIC PANEL
ALT: 14 U/L (ref 0–35)
AST: 18 U/L (ref 0–37)
Alkaline Phosphatase: 51 U/L (ref 39–117)
CO2: 21 mEq/L (ref 19–32)
GFR calc Af Amer: >90 mL/min (ref >90)
Glucose, Bld: 86 mg/dL (ref 70–99)
Potassium: 3.6 mEq/L (ref 3.5–5.1)
Sodium: 140 mEq/L (ref 135–145)
Total Protein: 6.9 g/dL (ref 6.0–8.3)

## 2011-04-27 LAB — CREATININE, SERUM
GFR calc Af Amer: >90 mL/min (ref >90)
GFR calc non Af Amer: >90 mL/min (ref >90)

## 2011-04-27 MED ORDER — SODIUM CHLORIDE 0.9 % IJ SOLN: 9.0000 mL | INTRAMUSCULAR | Status: DC | PRN

## 2011-04-27 MED ORDER — SODIUM CHLORIDE 0.9 % IV BOLUS (SEPSIS)
500.0000 mL | Freq: Once | INTRAVENOUS | Status: AC
  Administered 2011-04-27: 1000 mL via INTRAVENOUS

## 2011-04-27 MED ORDER — IBUPROFEN 600 MG PO TABS
600.0000 mg | ORAL_TABLET | Freq: Four times a day (QID) | ORAL | Status: DC
  Administered 2011-04-27 – 2011-04-28 (×6): 600 mg via ORAL
  Filled 2011-04-27 (×11): qty 1

## 2011-04-27 MED ORDER — BUPROPION HCL ER (XL) 150 MG PO TB24
150.0000 mg | ORAL_TABLET | Freq: Every day | ORAL | Status: DC
  Administered 2011-04-27 – 2011-04-28 (×2): 150 mg via ORAL
  Filled 2011-04-27 (×3): qty 1

## 2011-04-27 MED ORDER — ONDANSETRON HCL 4 MG PO TABS: 4.0000 mg | ORAL_TABLET | Freq: Four times a day (QID) | ORAL | Status: DC | PRN

## 2011-04-27 MED ORDER — HYDROMORPHONE HCL PF 1 MG/ML IJ SOLN
1.0000 mg | Freq: Once | INTRAMUSCULAR | Status: AC
  Administered 2011-04-27: 1 mg via INTRAVENOUS
  Filled 2011-04-27: qty 1

## 2011-04-27 MED ORDER — OXYCODONE-ACETAMINOPHEN 5-325 MG PO TABS: 1.0000 | ORAL_TABLET | Freq: Four times a day (QID) | ORAL | Status: DC

## 2011-04-27 MED ORDER — HYDROMORPHONE HCL PF 1 MG/ML IJ SOLN
0.5000 mg | Freq: Once | INTRAMUSCULAR | Status: AC
  Administered 2011-04-27: 0.5 mg via INTRAVENOUS
  Filled 2011-04-27: qty 1

## 2011-04-27 MED ORDER — TRINATAL RX 1 60-1 MG PO TABS
1.0000 | ORAL_TABLET | Freq: Every day | ORAL | Status: DC
  Administered 2011-04-27 – 2011-04-28 (×2): 1 via ORAL
  Filled 2011-04-27 (×3): qty 1

## 2011-04-27 MED ORDER — ONDANSETRON HCL 4 MG/2ML IJ SOLN: 4.0000 mg | Freq: Four times a day (QID) | INTRAMUSCULAR | Status: DC | PRN

## 2011-04-27 MED ORDER — POLYETHYLENE GLYCOL 3350 17 GM/SCOOP PO POWD
17.0000 g | Freq: Two times a day (BID) | ORAL | Status: DC
  Filled 2011-04-27: qty 255

## 2011-04-27 MED ORDER — LORAZEPAM 2 MG/ML IJ SOLN
1.0000 mg | Freq: Once | INTRAMUSCULAR | Status: AC
  Administered 2011-04-27: 1 mg via INTRAVENOUS
  Filled 2011-04-27: qty 1

## 2011-04-27 MED ORDER — LIDOCAINE HCL (PF) 1 % IJ SOLN
5.0000 mL | Freq: Once | INTRAMUSCULAR | Status: DC
  Filled 2011-04-27: qty 5

## 2011-04-27 MED ORDER — SODIUM CHLORIDE 0.9 % IV SOLN
INTRAVENOUS | Status: DC
  Administered 2011-04-27: 11:00:00 via INTRAVENOUS

## 2011-04-27 MED ORDER — POLYETHYLENE GLYCOL 3350 17 G PO PACK
17.0000 g | PACK | Freq: Two times a day (BID) | ORAL | Status: DC
  Administered 2011-04-27 – 2011-04-28 (×2): 17 g via ORAL
  Filled 2011-04-27 (×6): qty 1

## 2011-04-27 MED ORDER — DIPHENHYDRAMINE HCL 25 MG PO CAPS: 25.0000 mg | ORAL_CAPSULE | Freq: Three times a day (TID) | ORAL | Status: DC | PRN

## 2011-04-27 MED ORDER — HEPARIN SODIUM (PORCINE) 5000 UNIT/ML IJ SOLN
5000.0000 [IU] | Freq: Three times a day (TID) | INTRAMUSCULAR | Status: DC
  Administered 2011-04-27: 5000 [IU] via SUBCUTANEOUS
  Filled 2011-04-27 (×9): qty 1

## 2011-04-27 MED ORDER — HYDROMORPHONE 0.3 MG/ML IV SOLN
INTRAVENOUS | Status: DC
  Administered 2011-04-27: 2 mg via INTRAVENOUS
  Administered 2011-04-27: 11:00:00 via INTRAVENOUS
  Administered 2011-04-27: 0.6 mg via INTRAVENOUS
  Administered 2011-04-28: 0.3 mg via INTRAVENOUS
  Administered 2011-04-28: 1.8 mg via INTRAVENOUS
  Administered 2011-04-28: 1.2 mg via INTRAVENOUS
  Filled 2011-04-27 (×2): qty 25

## 2011-04-27 MED ORDER — NALOXONE HCL 0.4 MG/ML IJ SOLN: 0.4000 mg | INTRAMUSCULAR | Status: DC | PRN

## 2011-04-27 MED ORDER — ONDANSETRON HCL 4 MG/2ML IJ SOLN
4.0000 mg | Freq: Once | INTRAMUSCULAR | Status: AC
  Administered 2011-04-27: 4 mg via INTRAVENOUS
  Filled 2011-04-27: qty 2

## 2011-04-27 MED ORDER — METRONIDAZOLE 500 MG PO TABS
500.0000 mg | ORAL_TABLET | Freq: Two times a day (BID) | ORAL | Status: DC
  Administered 2011-04-27 – 2011-04-28 (×3): 500 mg via ORAL
  Filled 2011-04-27 (×6): qty 1

## 2011-04-27 MED ORDER — GI COCKTAIL ~~LOC~~
30.0000 mL | Freq: Once | ORAL | Status: AC
  Administered 2011-04-27: 30 mL via ORAL
  Filled 2011-04-27: qty 30

## 2011-04-27 MED ORDER — SODIUM CHLORIDE 0.9 % IV BOLUS (SEPSIS): 500.0000 mL | Freq: Once | INTRAVENOUS | Status: DC

## 2011-04-27 NOTE — ED Notes (Signed)
Nurse unavailable for report at this time. Room has been changed to 4502

## 2011-04-27 NOTE — ED Notes (Signed)
Requested dilaudid for pca from pharmacy at this time

## 2011-04-27 NOTE — ED Notes (Signed)
Pt has removed gown and clothes.  She is moaning in pain and rubbing abd.  States back and stomach hurt.  Pt also states that LMP was November 11th and that she is late and thinks she may be pregnant.  Pt request pregnancy test. States she has taken $1 store test that were all negative.  Dr. Denton Lank notified.

## 2011-04-27 NOTE — ED Provider Notes (Addendum)
History     CSN: [id number] Arrival date & time: 04/27/2011  1:08 AM   First MD Initiated Contact with Patient 04/27/11 0139      Chief Complaint  Patient presents with  . Sickle Cell Pain Crisis    back pain    (Consider location/radiation/quality/duration/timing/severity/associated sxs/prior treatment) Patient is a 20 y.o. female presenting with sickle cell pain. The history is provided by the patient.  Sickle Cell Pain Crisis  Associated symptoms include back pain. Pertinent negatives include no chest pain, no abdominal pain, no dysuria, no hematuria, no headaches, no neck pain, no rash and no eye redness.  pt c/o 'entire back' pain for past couple days. Says is same as her sickle cell pain. No focal pain. No chest pain or sob. No cough or uri c/o. No abd pain. No faintness/weakness/dizziness. No fever or chills. Denies back injury or fall. Constant, dull pain worse w palpation and movement.   Past Medical History  Diagnosis Date  . Sickle cell anemia with crisis   . Sickle cell anemia   . H/O: 1 miscarriage 03/22/2011  . SICKLE CELL ANEMIA 01/08/2009  . 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 Active smoker 08/09/2010  . Depression 01/06/2011  . GERD (gastroesophageal reflux disease) 02/17/2011  . Sickle cell anemia     pf has frequent sickle cell crisis  . Sickle cell anemia     Past Surgical History  Procedure Date  . Cholecystectomy   . Choley     History reviewed. No pertinent family history.  History  Substance Use Topics  . Smoking status: Former Smoker    Types: Cigars    Quit date: 04/14/2011  . Smokeless tobacco: Never Used  . Alcohol Use: No    OB History    Grav Para Term Preterm Abortions TAB SAB Ect Mult Living   1               Review of Systems  Constitutional: Negative for fever and chills.  HENT: Negative for neck pain.   Eyes: Negative for redness.  Respiratory: Negative for shortness of breath.   Cardiovascular: Negative for chest pain.    Gastrointestinal: Negative for abdominal pain.  Genitourinary: Negative for dysuria, hematuria and flank pain.  Musculoskeletal: Positive for back pain. Negative for joint swelling.  Skin: Negative for rash.  Neurological: Negative for headaches.  Hematological: Does not bruise/bleed easily.  Psychiatric/Behavioral: Negative for confusion.    Allergies  Toradol  Home Medications   Current Outpatient Rx  Name Route Sig Dispense Refill  . BUPROPION HCL ER (XL) 150 MG PO TB24 Oral Take 1 tablet (150 mg total) by mouth daily. 30 tablet 1  . CYCLOBENZAPRINE HCL 5 MG PO TABS Oral Take 1 tablet (5 mg total) by mouth 3 (three) times daily as needed for muscle spasms. 30 tablet 0  . METRONIDAZOLE 500 MG PO TABS Oral Take 1 tablet (500 mg total) by mouth every 12 (twelve) hours. 10 tablet 0  . OXYCODONE-ACETAMINOPHEN 5-325 MG PO TABS Oral Take 1-2 tablets by mouth every 6 (six) hours as needed for pain. 60 tablet 0  . POLYETHYLENE GLYCOL 3350 PO POWD Oral Take 17 g by mouth 2 (two) times daily as needed. For constipation    . PRENATAL RX 60-1 MG PO TABS Oral Take 1 tablet by mouth daily. 90 tablet 1  . TRAMADOL HCL 50 MG PO TABS Oral Take 1 tablet (50 mg total) by mouth every 6 (six) hours as needed for pain. Maximum  dose= 8 tablets per day 20 tablet 0    BP 101/70  Pulse 97  Temp(Src) 97.9 F (36.6 C) (Oral)  Resp 20  SpO2 100%  LMP 03/26/2011  Physical Exam  Nursing note and vitals reviewed. Constitutional: She appears well-developed and well-nourished. No distress.  Eyes: Conjunctivae are normal. No scleral icterus.  Neck: Neck supple. No tracheal deviation present.  Cardiovascular: Normal rate, regular rhythm, normal heart sounds and intact distal pulses.  Exam reveals no gallop and no friction rub.   No murmur heard. Pulmonary/Chest: Effort normal and breath sounds normal. No respiratory distress.  Abdominal: Soft. Normal appearance and bowel sounds are normal. She exhibits no  distension. There is no tenderness.  Genitourinary:       No cva tenderness  Musculoskeletal: She exhibits no edema and no tenderness.       Spine nt. Diffuse muscular tenderness to back  Neurological: She is alert.  Skin: Skin is warm and dry. No rash noted.  Psychiatric: She has a normal mood and affect.    ED Course  Procedures (including critical care time)  Results for orders placed during the hospital encounter of 04/27/11  CBC      Component Value Range   WBC 15.8 (*) 4.0 - 10.5 (K/uL)   RBC 2.68 (*) 3.87 - 5.11 (MIL/uL)   Hemoglobin 7.9 (*) 12.0 - 15.0 (g/dL)   HCT 16.1 (*) 09.6 - 46.0 (%)   MCV 86.2  78.0 - 100.0 (fL)   MCH 29.5  26.0 - 34.0 (pg)   MCHC 34.2  30.0 - 36.0 (g/dL)   RDW 04.5 (*) 40.9 - 15.5 (%)   Platelets 478 (*) 150 - 400 (K/uL)  PREGNANCY, URINE      Component Value Range   Preg Test, Ur NEGATIVE      MDM  Iv ns. Dilaudid 1 mg iv. zofran iv. 02 [place]. 500 cc ns iv.    Recheck anxious, no new pain or c/o, but says now wants pregnancy tests, says the 'dollar tests' were negative but says period a few days late, requests preg test.  Pt offered comfort measures, reassurance.  Ativan 1 mg iv.  Additional dilaudid 1 mg iv for pain.   Pt more comfortable.    Recheck pt comfortable. Pt followed by [facility] - instructed to call there this am for follow up.   Upon discussing possible d/c, as appears much, much more comfortable than prior, pt becomes very agitated, cries out loudly that is having severe pain, crying/tearful (note immediately prior appeared resting comfortably/quietly).  Pt reassured. Pt says pain not improved. Dilaudid 1 mg iv.  [facility] called to see pt for possible admission-discussed possible consideration of need for care plan given multiple, multiple admission in past couple years, and noting references in clinic notes to stressors/psychosocial issues, etc.   Suzi Roots, MD 04/27/11 8119  Suzi Roots, MD 04/27/11 [phone]

## 2011-04-27 NOTE — ED Notes (Signed)
EDP at [facility] 

## 2011-04-27 NOTE — H&P (Signed)
[name] is an 20 y.o. female.   Chief Complaint: Diffuse back pain and abdominal pain x1 day.  HPI: 20 year old female with sickle cell anemia and frequent pain crises presents with worsening back pain. Patient was discharged from the hospital last week she was treated for pain crisis associated with back pain. Since her hospital discharge she has followed up with your PCP received a refill of her home pain medication as well as trigger point injection. She states that her back pain was tolerable but worsened yesterday starting at 5 PM. She tried taking her home Percocet and Flexeril, but that did not relieve the pain.  She describes the pain as severe, diffuse, deep bone pain, without radiation. Along with the back pain she reports minimal abdominal pain. She denies nausea, vomiting, diarrhea, constipation, dysuria, fever, chest pain or shortness of breath. She last ate yesterday and tolerated that normally.  ED course: Patient received IV Dilaudid, GI cocktail and IV fluids. Her pain was intermittently controlled but then returned within 30 minutes to an hour. I evaluate the patient and administered a 1% lidocaine injection to trigger point located over her right scapula. She reported immediate relief of pain in that area but the rest of her back pain persisted. She is adamant that she will not be able to go home and control her current pain with oral pain medication.   Past Medical History  Diagnosis Date  . Sickle cell anemia with crisis   . Sickle cell anemia   . H/O: 1 miscarriage 03/22/2011  . SICKLE CELL ANEMIA 01/08/2009  . 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 Active smoker 08/09/2010  . Depression 01/06/2011  . GERD (gastroesophageal reflux disease) 02/17/2011  . Sickle cell anemia     pf has frequent sickle cell crisis  . Sickle cell anemia     Past Surgical History  Procedure Date  . Cholecystectomy   . Choley     History reviewed. No pertinent family history. Social History:   reports that she quit smoking about 1 weeks ago. Her smoking use included Cigars. She has never used smokeless tobacco. She reports that she uses illicit drugs. She reports that she does not drink alcohol.  Allergies:  Allergies  Allergen Reactions  . Toradol Hives and Itching    Some itching, give with benedryl     Medications Prior to Admission  Medication Sig Dispense Refill  . buPROPion (WELLBUTRIN XL) 150 MG 24 hr tablet Take 1 tablet (150 mg total) by mouth daily.  30 tablet  1  . cyclobenzaprine (FLEXERIL) 5 MG tablet Take 1 tablet (5 mg total) by mouth 3 (three) times daily as needed for muscle spasms.  30 tablet  0  . metroNIDAZOLE (FLAGYL) 500 MG tablet Take 1 tablet (500 mg total) by mouth every 12 (twelve) hours.  10 tablet  0  . oxyCODONE-acetaminophen (ROXICET) 5-325 MG per tablet Take 1-2 tablets by mouth every 6 (six) hours as needed for pain.  60 tablet  0  . polyethylene glycol powder (GLYCOLAX/MIRALAX) powder Take 17 g by mouth 2 (two) times daily as needed. For constipation      . Prenatal Vit-Fe Fumarate-FA (PRENATAL MULTIVITAMIN) 60-1 MG tablet Take 1 tablet by mouth daily.  90 tablet  1  . traMADol (ULTRAM) 50 MG tablet Take 1 tablet (50 mg total) by mouth every 6 (six) hours as needed for pain. Maximum dose= 8 tablets per day  20 tablet  0    Pertinent Labs:  -H/H  8.1/23.3 WBC 15.7  -Cr. 0.56  - U preg neg  ROS Pertinent review of systems as per history of present illness.  MSK: Patient denies arm or leg pain.    Blood pressure 103/64, pulse 80, temperature 98.6 F (37 C), temperature source Oral, resp. rate 20, last menstrual period 03/26/2011, SpO2 99.00%. Physical Exam  General appearance: alert, cooperative and no distress Head: Normocephalic, without obvious abnormality, atraumatic Throat: lips, mucosa, and tongue normal; teeth and gums normal Back: symmetric, no curvature. ROM normal. No CVA tenderness., No paraspinal tenderness. Diffuse tenderness  to muscle palpation. 2 x 2 centimeter muscle spasm over her right scapula. Lungs: clear to auscultation bilaterally Heart: regular rate and rhythm, S1, S2 normal, no murmur, click, rub or gallop Abdomen: soft, non-tender; bowel sounds normal; no masses,  no organomegaly Extremities: extremities normal, atraumatic, no cyanosis or edema Pulses: 2+ and symmetric Skin: Skin color, texture, turgor normal. No rashes or lesions Neurologic: Grossly normal Psych: Depressed affect patient is intermittently tearful during exam. He  Trigger Point Injection  Consent was obtained for a lidocaine injection of R scapula trigger point. Injection point was marked. The patient's back was prepped widely with iodine and alcohol. 3 cc of 1% lidocaine was injected under sterile technique. The patient reported immediate relief of pain. The patient tolerated the procedure well.  Assessment/Plan 20 year old female with sickle cell anemia presents with a complaint of severe back pain and mild abdominal pain.  1. Back pain:  A: Multifactorial from muscle spasm and possible pain crisis. Patient with stable hemoglobin. She has no urinary symptoms to suggest UTI. She has no evidence of pyelonephritis on history or physical exam. I do not suspect pancreatitis. P:  -Admit for observation. -Pain management with Dilaudid PCA. The plan to transition to oral therapy since possible. -Continue IV fluids.  2. Sickle cell anemia: Hematocrit near baseline at 23.3. Plan to recheck at midday. Patient has no evidence acute chest on history physical exam. We'll monitor closely for new onset oxygen requirement or new onset chest pain. If this develops plan for chest x-ray and antibiotics.   3. Bacterial vaginosis: Patient diagnosed with BV on previous hospital admission. Will continue course of Flagyl stop date 04/29/2011.  4. Depression: Continue home Wellbutrin. Patient would benefit benefit from outpatient cognitive behavioral  therapy.  5. FEN GI: Patient tolerating orals. Will start regular diet.  6. DVT prophylaxis: Heparin 5000 units subcutaneous 3 times a day.  7. Disposition: Plan for discharge to home pending control of pain. Recommend team meeting with patient's PCP to come up with a care plan to address pain crises.    Janeth [name] 04/27/2011, 8:42 AM

## 2011-04-27 NOTE — H&P (Signed)
I have seen and examined this patient. I have discussed with [facility].  I agree with their findings and plans as documented in their admit note.

## 2011-04-27 NOTE — ED Notes (Signed)
Admitting/consulting MD at [facility].

## 2011-04-27 NOTE — ED Notes (Signed)
Nurse available for report at this time, however room currently under room construction

## 2011-04-27 NOTE — ED Notes (Signed)
IVF infusing, NAD, calm, interactive, intermittantly moaning, visitor at [facility], EDP into speak with pt, meds given, will continue to monitor.

## 2011-04-27 NOTE — ED Notes (Signed)
Pt sleeping/resting intermittantly, awake upon entering room, calm, NAD, no s/sx of pain or distress, rates pain as "better, but severe 8/10", pinpoints pain to back and abd, "acid reflux better", boyfriend sleeping in chair at [facility], pt states, "do not feel like I can go home". EDP notified, "will see".

## 2011-04-27 NOTE — ED Notes (Signed)
C/o sickle cell pain crisis, onset tuesday, worse tonight, took meds this morning, pinpoints pain to "whole back", denies other pain, (denies; recent sickness, fever, nvd, sob, dizziness or other sx), "just pain". Has taken bupropion, flagyl, cyclobenzaprine & oxycodone.

## 2011-04-28 LAB — CBC
HCT: 24.1 % — ABNORMAL LOW (ref 36.0–46.0)
MCH: 29.3 pg (ref 26.0–34.0)
MCHC: 34.4 g/dL (ref 30.0–36.0)
RDW: 16.9 % — ABNORMAL HIGH (ref 11.5–15.5)

## 2011-04-28 MED ORDER — DIPHENHYDRAMINE HCL 12.5 MG/5ML PO ELIX
12.5000 mg | ORAL_SOLUTION | Freq: Four times a day (QID) | ORAL | Status: DC | PRN
  Filled 2011-04-28: qty 5

## 2011-04-28 MED ORDER — OXYCODONE-ACETAMINOPHEN 5-325 MG PO TABS: 2.0000 | ORAL_TABLET | Freq: Once | ORAL | Status: DC

## 2011-04-28 MED ORDER — SODIUM CHLORIDE 0.9 % IJ SOLN: 9.0000 mL | INTRAMUSCULAR | Status: DC | PRN

## 2011-04-28 MED ORDER — HYDROMORPHONE 0.3 MG/ML IV SOLN
INTRAVENOUS | Status: DC
  Administered 2011-04-28: 2.7 mg via INTRAVENOUS
  Administered 2011-04-28: 1.5 mg via INTRAVENOUS
  Administered 2011-04-28: 0.9 mg via INTRAVENOUS
  Administered 2011-04-29: 1.2 mg via INTRAVENOUS
  Filled 2011-04-28: qty 25

## 2011-04-28 MED ORDER — DIPHENHYDRAMINE HCL 50 MG/ML IJ SOLN
12.5000 mg | Freq: Four times a day (QID) | INTRAMUSCULAR | Status: DC | PRN
  Administered 2011-04-29: 12.5 mg via INTRAVENOUS
  Filled 2011-04-28: qty 4

## 2011-04-28 MED ORDER — ONDANSETRON HCL 4 MG/2ML IJ SOLN: 4.0000 mg | Freq: Four times a day (QID) | INTRAMUSCULAR | Status: DC | PRN

## 2011-04-28 MED ORDER — OXYCODONE-ACETAMINOPHEN 5-325 MG PO TABS: 1.0000 | ORAL_TABLET | Freq: Four times a day (QID) | ORAL | Status: DC | PRN

## 2011-04-28 MED ORDER — NALOXONE HCL 0.4 MG/ML IJ SOLN
0.4000 mg | INTRAMUSCULAR | Status: DC | PRN
  Filled 2011-04-28: qty 1

## 2011-04-28 MED ORDER — BUPIVACAINE HCL 0.25 % IJ SOLN
5.0000 mL | Freq: Once | INTRAMUSCULAR | Status: DC
  Filled 2011-04-28: qty 5

## 2011-04-28 NOTE — Progress Notes (Signed)
PGY-1 Daily Progress Note Family Medicine Teaching Service D. [place] Rolene Arbour, MD Service Pager: [phone]  Patient name: [name]  Medical record [id number] Date of birth:1990-06-10 Age: 20 y.o. Gender: female  LOS: 1 day   Subjective: Feeling a little better. Pain of abdomen and back controlled with PCA Dilaudid. Had one vomit today in the morning. No diarrhea. No cough or SOB.  Objective:  Vitals: Temp: 98.3 F (36.8 C) (12/13 0430) Pulse Rate: 79  (12/13 0720) Resp:20  (12/13 0430) BP:  82/56 mmHg (12/13 0720) SpO2: 94 % (12/13 0030)  Intake/Output Summary (Last 24 hours) at 04/28/11 1001 Last data filed at 04/28/11 0315  Gross per 24 hour  Intake      0 ml  Output    500 ml  Net   -500 ml   Physical Exam:  General appearance: alert, cooperative and no distress  HEENT: Normal moist mucosa. Back: symmetric. ROM normal. No CVA tenderness., No paraspinal tenderness. Diffuse tenderness to muscle palpation. 2 x 2 centimeter muscle spasm over her right scapula.  Lungs: clear to auscultation bilaterally  Heart: regular rate and rhythm, S1, S2 normal, no murmur, click, rub or gallop  Abdomen: soft, non-tender; bowel sounds normal; no masses, no organomegaly  Extremities: extremities normal, atraumatic, no cyanosis or edema  Skin: Skin color, texture, turgor normal. No rashes or lesions  Neurologic: Grossly normal  Psych: Depressed affect and worried that today is a Spanish final test and she wanted to contact her teacher to re-schedule it.  Labs and imaging:  CBC  Lab 04/27/11 1502 04/27/11 0726 04/27/11 0208  WBC 15.2* 15.7* 15.8*  HGB 8.3* 8.1* 7.9*  HCT 23.7* 23.3* 23.1*  PLT 495* 490* 478*  BMET  Lab 04/27/11 0726  NA 140  K 3.6  CL 111  CO2 21  BUN 4*  CREATININE 0.560.57  LABGLOM --  GLUCOSE 86  CALCIUM 8.2*   Medications: Medication Dose Route Frequency  . buPROPion (WELLBUTRIN XL) 24 hr tablet 150 mg  150 mg Oral Daily  .  diphenhydrAMINE (BENADRYL) capsule 25 mg  25 mg Oral Q8H PRN  . heparin injection 5,000 Units  5,000 Units S/C Q8H  . HYDROmorphone (DILAUDID) PCA injection 0.3 mg/mL   I/V Q4H  . ibuprofen (ADVIL,MOTRIN) tablet 600 mg  600 mg Oral QID  . metroNIDAZOLE (FLAGYL) tablet 500 mg  500 mg Oral Q12H  . multivitamin prental (TRINATAL) 60-1 MG tablet 1 tablet  1 tablet Oral Daily  . naloxone (NARCAN) injection 0.4 mg  0.4 mg Intravenous PRN      . ondansetron (ZOFRAN) tablet 4 mg  4 mg Oral Q6H PRN    . ondansetron (ZOFRAN) injection 4 mg  4 mg Intravenous Q6H PRN  . polyethylene glycol (MIRALAX / GLYCOLAX) packet 17 g  17 g Oral BID   Assessment and Plan: 20 year old female with sickle cell anemia admitted with a complaint of severe back pain and mild abdominal pain.  1. Back pain: Patient with stable hemoglobin and WBC around her baseline. Afebrile Pain better with PCA dilaudid.  -Tansition to  PO pain medications today's afternoon. 2.Sickle cell anemia:  Monitor closely for new onset oxygen requirement, pain not resolving with pain medication, fever, increase in WBC. If this develops plan for chest x-ray and antibiotics. 3. Nausea and vomit: today one episode of n/v.  -Zofran administered. 3. Bacterial vaginosis: Patient diagnosed with BV on previous hospital admission. -Continue course of Flagyl stop date 04/29/2011. If pt  continues with N/V this could be something to consider to stop. 4. Depression: Continue home Wellbutrin. Patient would benefit benefit from outpatient cognitive behavioral therapy.  5. FEN GI: Patient tolerating orals.  IV 75 ml/h. If no more vomits, plan to saline lock IV this afternoon. 6. DVT prophylaxis: Heparin 5000 units TID 7. Disposition: Plan for discharge to home pending control of pain.   D. [place] Rolene Arbour, MD PGY1, [facility] Medicine Teaching Service Pager [phone] 04/28/2011

## 2011-04-28 NOTE — Progress Notes (Signed)
I have seen and examined this patient. I have discussed with Dr Aviva Signs.  I agree with their findings and plans as documented in their progress note for today.  Acute Issues Vaso-occlusive pain crisis.  Patient currently with level of pain in her overall back that impairs her ability to get out of bed without assistance.  Pt requesting to continue the PCA dilaudid overnite.    Will continue the PCA overnite with re-attempt at transition to oral opiate therapy in morning.  Pt requesting trigger point injection into right scapular area as this has helped her pain control in the past.  Will request needle, syringe and marcaine to bedside.

## 2011-04-29 DIAGNOSIS — F439 Reaction to severe stress, unspecified: Secondary | ICD-10-CM | POA: Insufficient documentation

## 2011-04-29 MED ORDER — OXYCODONE-ACETAMINOPHEN 5-325 MG PO TABS: 1.0000 | ORAL_TABLET | ORAL | Status: DC

## 2011-04-29 NOTE — Assessment & Plan Note (Signed)
Pt received depo provera prior to d/c from hospital last week.  Pt given reminder card for next injection.

## 2011-04-29 NOTE — Discharge Summary (Signed)
Physician Discharge Summary  Patient ID: [name] [id number] [birth year] 20 y.o.  Admit date: 04/27/2011 Discharge date: 04/29/2011  PCP: [name], MD   Discharge Diagnosis 1. Sickle Cell vasoclusive pain crisis. 2. Musculoskeletal pain (Left Scapula) 2. Bacterial vaginosis completed course of treatment.  Discharge Medications  [place], [place]  Home Medication Instructions [id number]   Printed on:04/29/11 0940  Medication Information                    polyethylene glycol powder (GLYCOLAX/MIRALAX) powder Take 17 g by mouth 2 (two) times daily as needed. For constipation           buPROPion (WELLBUTRIN XL) 150 MG 24 hr tablet Take 1 tablet (150 mg total) by mouth daily.           cyclobenzaprine (FLEXERIL) 5 MG tablet Take 1 tablet (5 mg total) by mouth 3 (three) times daily as needed for muscle spasms.           Prenatal Vit-Fe Fumarate-FA (PRENATAL MULTIVITAMIN) 60-1 MG tablet Take 1 tablet by mouth daily.           oxyCODONE-acetaminophen (ROXICET) 5-325 MG per tablet Take 1-2 tablets by mouth every 6 (six) hours as needed for pain.           traMADol (ULTRAM) 50 MG tablet Take 1 tablet (50 mg total) by mouth every 6 (six) hours as needed for pain. Maximum dose= 8 tablets per day              Consults: None.   Labs: CBC  Lab 04/28/11 0840 04/27/11 1502 04/27/11 0726  WBC 12.6* 15.2* 15.7*  HGB 8.3* 8.3* 8.1*  HCT 24.1* 23.7* 23.3*  PLT 537* 495* 490*   BMET  Lab 04/27/11 0726  NA 140  K 3.6  CL 111  CO2 21  BUN 4*  CREATININE 0.560.57  CALCIUM 8.2*  PROT 6.9  BILITOT 1.8*  ALKPHOS 51  ALT 14  AST 18  GLUCOSE 86     Procedures/Imaging:  None done on this admission. Prior was 04/20/11 with negative results for acute cardiopulmonary findings.   Brief Hospital Course: 20 year old female with sickle cell anemia admitted with a complaint of severe back pain and mild abdominal pain.  1. Back pain on a sickle cell disease pt:  She had a recent  pain crisis admission on the 04/20/11 with similar simptoms.  In addition to diffuse back pain, she complained of musculoskeletal type pain near her scapula. Patient with stable hemoglobin and WBC around her baseline. Afebrile. On  PCA dilaudid and good transition to Oxycodone also on Flexeril since last hospitalization. Pain was well controlled and was virtually pain free at time of discharge. She wanted to go home since she was on final exams(school) and was feeling well enough to leave with the oral treatment she was on. 2.During hospitalization had one episode of nausea/vomiting that resolved with Zofran, this could be the metronidazole she was taking for her BV. Pt stop date of Metronidazole 04/29/11. Completely asymptomatic at the time of discharge.  Patient condition at time of discharge/disposition:  Patient is discharge on stable medical condition.   Follow up issues: 1. Outpatient pain management.  Discharge follow up: Discharge Orders    Future Appointments: Provider: Department: Dept Phone: Center:   05/13/2011 2:00 PM [name] [facility] [phone] [facility]   05/26/2011 10:30 AM [name] [facility] [phone] [facility]      D. Piloto Tommi Rumps  Criselda Peaches, MD  [facility] 04/29/2011

## 2011-04-29 NOTE — Assessment & Plan Note (Signed)
Pt has missed last few Hematology appointments due to hospitalizations.  Pt encouraged to reschedule appt to discuss hydroxyurea therapy and other things that may decrease the number of admissions.

## 2011-04-29 NOTE — Assessment & Plan Note (Signed)
Trigger point injection given in focal area of pain in right scapular area.  Pt to continue motrin and flexeril prn.

## 2011-04-29 NOTE — Assessment & Plan Note (Signed)
Per my ER consult yesterday,  note pt endorses increased stress, pt to f/up with Nelva Bush, CSW --as already scheduled to discuss barriers to self care as well as life stressors in more detail.  If feel that pt is a good canidate for therapy and if pt could learn skills to better manage life stressors we may see a decrease in the number of hospitalizations.

## 2011-05-02 NOTE — Discharge Summary (Signed)
I discussed with Dr Piloto.  I agree with their plans documented in their  Note for today.  

## 2011-05-04 ENCOUNTER — Emergency Department (HOSPITAL_COMMUNITY)
Admission: EM | Admit: 2011-05-04 | Discharge: 2011-05-04 | Disposition: A | Discharge status: Home / Self Care | Payer: Medicaid Other | Attending: Emergency Medicine | Admitting: Emergency Medicine

## 2011-05-04 ENCOUNTER — Emergency Department (HOSPITAL_COMMUNITY): Payer: Medicaid Other

## 2011-05-04 ENCOUNTER — Encounter (HOSPITAL_COMMUNITY): Payer: Self-pay | Admitting: *Deleted

## 2011-05-04 DIAGNOSIS — R0602 Shortness of breath: Secondary | ICD-10-CM | POA: Insufficient documentation

## 2011-05-04 DIAGNOSIS — R300 Dysuria: Secondary | ICD-10-CM | POA: Insufficient documentation

## 2011-05-04 DIAGNOSIS — R079 Chest pain, unspecified: Secondary | ICD-10-CM | POA: Insufficient documentation

## 2011-05-04 DIAGNOSIS — N390 Urinary tract infection, site not specified: Secondary | ICD-10-CM | POA: Insufficient documentation

## 2011-05-04 DIAGNOSIS — D5700 Hb-SS disease with crisis, unspecified: Secondary | ICD-10-CM | POA: Insufficient documentation

## 2011-05-04 DIAGNOSIS — Z87891 Personal history of nicotine dependence: Secondary | ICD-10-CM | POA: Insufficient documentation

## 2011-05-04 LAB — POCT I-STAT, CHEM 8
BUN: 5 mg/dL — ABNORMAL LOW (ref 6–23)
Creatinine, Ser: 0.7 mg/dL (ref 0.50–1.10)
Hemoglobin: 10.2 g/dL — ABNORMAL LOW (ref 12.0–15.0)
Potassium: 4.1 mEq/L (ref 3.5–5.1)
Sodium: 142 mEq/L (ref 135–145)
TCO2: 25 mmol/L (ref 0–100)

## 2011-05-04 LAB — CBC
HCT: 26.6 % — ABNORMAL LOW (ref 36.0–46.0)
Hemoglobin: 9.1 g/dL — ABNORMAL LOW (ref 12.0–15.0)
RBC: 3.07 MIL/uL — ABNORMAL LOW (ref 3.87–5.11)
WBC: 18.2 10*3/uL — ABNORMAL HIGH (ref 4.0–10.5)

## 2011-05-04 LAB — URINALYSIS, ROUTINE W REFLEX MICROSCOPIC
Bilirubin Urine: NEGATIVE
Nitrite: NEGATIVE
Specific Gravity, Urine: 1.013 (ref 1.005–1.030)
pH: 6 (ref 5.0–8.0)

## 2011-05-04 LAB — URINE MICROSCOPIC-ADD ON

## 2011-05-04 LAB — DIFFERENTIAL
Lymphocytes Relative: 13 % (ref 12–46)
Monocytes Absolute: 2.3 10*3/uL — ABNORMAL HIGH (ref 0.1–1.0)
Monocytes Relative: 13 % — ABNORMAL HIGH (ref 3–12)
Neutro Abs: 12.8 10*3/uL — ABNORMAL HIGH (ref 1.7–7.7)
Neutrophils Relative %: 70 % (ref 43–77)

## 2011-05-04 LAB — PREGNANCY, URINE: Preg Test, Ur: NEGATIVE

## 2011-05-04 MED ORDER — HYDROMORPHONE HCL PF 1 MG/ML IJ SOLN
1.0000 mg | Freq: Once | INTRAMUSCULAR | Status: AC
  Administered 2011-05-04: 1 mg via INTRAVENOUS
  Filled 2011-05-04: qty 1

## 2011-05-04 MED ORDER — SODIUM CHLORIDE 0.9 % IV SOLN
INTRAVENOUS | Status: DC
  Administered 2011-05-04: 125 mL/h via INTRAVENOUS

## 2011-05-04 MED ORDER — CIPROFLOXACIN HCL 500 MG PO TABS: 500.0000 mg | ORAL_TABLET | Freq: Two times a day (BID) | ORAL | Status: DC

## 2011-05-04 MED ORDER — DEXTROSE 5 % IV SOLN
1.0000 g | Freq: Once | INTRAVENOUS | Status: AC
  Administered 2011-05-04: 1 g via INTRAVENOUS
  Filled 2011-05-04: qty 10

## 2011-05-04 MED ORDER — SODIUM CHLORIDE 0.9 % IV BOLUS (SEPSIS)
500.0000 mL | Freq: Once | INTRAVENOUS | Status: AC
  Administered 2011-05-04: 500 mL via INTRAVENOUS

## 2011-05-04 NOTE — ED Provider Notes (Signed)
History     CSN: [id number] Arrival date & time: 05/04/2011  2:27 PM   First MD Initiated Contact with Patient 05/04/11 1526      Chief Complaint  Patient presents with  . Sickle Cell Pain Crisis    (Consider location/radiation/quality/duration/timing/severity/associated sxs/prior treatment) Patient is a 20 y.o. female presenting with sickle cell pain. The history is provided by the patient.  Sickle Cell Pain Crisis  This is a recurrent problem. The current episode started today. The problem occurs frequently. Associated symptoms include chest pain and dysuria. Pertinent negatives include no abdominal pain, no diarrhea, no nausea, no vomiting, no headaches, no back pain, no weakness, no rash and no eye pain.   patient has a recurrence of her typical sickle cell pain. His right chest and right arm. She's had a little cough and states she has a cold. No fevers. No nausea vomiting diarrhea. Her pain is unrelieved with her oxycodone at home. She states the Flexeril just makes her sleep. She states she also has pain with urination. She's not going more frequently. Some mild vaginal discharge. She states she feels that she is oxygen. She states she feels as if she'll be able go home after some treatment.  Past Medical History  Diagnosis Date  . H/O: 1 miscarriage 03/22/2011  . 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 Active smoker 08/09/2010  . Depression 01/06/2011  . GERD (gastroesophageal reflux disease) 02/17/2011  . Trichotillomania     h/o  . Blood transfusion   . Sickle cell anemia with crisis   . Sickle cell anemia   . SICKLE CELL ANEMIA 01/08/2009  . Sickle cell anemia     pf has frequent sickle cell crisis  . Sickle cell anemia   . Sickle cell crisis 04/27/11  . Sickle-cell disease with vaso-occlusive pain     Past Surgical History  Procedure Date  . Cholecystectomy 05/2010  . Dilation and curettage of uterus 02/20/11    No family history on file.  History  Substance Use Topics  .  Smoking status: Former Smoker    Types: Cigars    Quit date: 04/14/2011  . Smokeless tobacco: Never Used  . Alcohol Use: No    OB History    Grav Para Term Preterm Abortions TAB SAB Ect Mult Living   1               Review of Systems  Constitutional: Negative for activity change and appetite change.  HENT: Negative for neck stiffness.   Eyes: Negative for pain.  Respiratory: Positive for shortness of breath. Negative for chest tightness.   Cardiovascular: Positive for chest pain. Negative for leg swelling.  Gastrointestinal: Negative for nausea, vomiting, abdominal pain and diarrhea.  Genitourinary: Positive for dysuria. Negative for flank pain.  Musculoskeletal: Negative for back pain.  Skin: Negative for rash.  Neurological: Negative for weakness, numbness and headaches.  Psychiatric/Behavioral: Negative for behavioral problems.    Allergies  Toradol  Home Medications   Current Outpatient Rx  Name Route Sig Dispense Refill  . BUPROPION HCL ER (XL) 150 MG PO TB24 Oral Take 1 tablet (150 mg total) by mouth daily. 30 tablet 1  . CYCLOBENZAPRINE HCL 10 MG PO TABS Oral Take 10 mg by mouth 3 (three) times daily as needed. For muscle spasms     . OXYCODONE-ACETAMINOPHEN 5-325 MG PO TABS Oral Take 1-2 tablets by mouth every 6 (six) hours as needed for pain. 60 tablet 0  . PRENATAL RX 60-1 MG PO  TABS Oral Take 1 tablet by mouth daily. 90 tablet 1  . CIPROFLOXACIN HCL 500 MG PO TABS Oral Take 1 tablet (500 mg total) by mouth 2 (two) times daily. 10 tablet 0    BP 105/58  Pulse 139  Temp(Src) 98.2 F (36.8 C) (Oral)  Resp 16  SpO2 97%  Physical Exam  Nursing note and vitals reviewed. Constitutional: She is oriented to person, place, and time. She appears well-developed and well-nourished.       Patient is uncomfortable appearing  HENT:  Head: Normocephalic and atraumatic.  Eyes: EOM are normal. Pupils are equal, round, and reactive to light.  Neck: Normal range of  motion. Neck supple.  Cardiovascular: Normal rate, regular rhythm and normal heart sounds.   No murmur heard. Pulmonary/Chest: Effort normal and breath sounds normal. No respiratory distress. She has no wheezes. She has no rales.  Abdominal: Soft. Bowel sounds are normal. She exhibits no distension. There is no tenderness. There is no rebound and no guarding.  Musculoskeletal: Normal range of motion.  Neurological: She is alert and oriented to person, place, and time. No cranial nerve deficit.  Skin: Skin is warm and dry.  Psychiatric: She has a normal mood and affect. Her speech is normal.    ED Course  Procedures (including critical care time)  Labs Reviewed  CBC - Abnormal; Notable for the following:    WBC 18.2 (*)    RBC 3.07 (*)    Hemoglobin 9.1 (*)    HCT 26.6 (*)    RDW 17.1 (*)    Platelets 508 (*)    All other components within normal limits  DIFFERENTIAL - Abnormal; Notable for the following:    Neutro Abs 12.8 (*)    Monocytes Relative 13 (*)    Monocytes Absolute 2.3 (*)    All other components within normal limits  URINALYSIS, ROUTINE W REFLEX MICROSCOPIC - Abnormal; Notable for the following:    APPearance TURBID (*)    Hgb urine dipstick MODERATE (*)    Leukocytes, UA LARGE (*)    All other components within normal limits  POCT I-STAT, CHEM 8 - Abnormal; Notable for the following:    BUN 5 (*)    Hemoglobin 10.2 (*)    HCT 30.0 (*)    All other components within normal limits  URINE MICROSCOPIC-ADD ON - Abnormal; Notable for the following:    Squamous Epithelial / LPF FEW (*)    Bacteria, UA MANY (*)    All other components within normal limits  PREGNANCY, URINE  I-STAT, CHEM 8  URINE CULTURE   Dg Chest 2 View  05/04/2011  *RADIOLOGY REPORT*  Clinical Data: Cough, back pain, chest pain, sickle cell disease  CHEST - 2 VIEW  Comparison: 04/19/2004  Findings: Enlargement of cardiac silhouette. Mediastinal contours and pulmonary vascularity normal. Slight  volume loss in the right upper lobe with superior retraction of the minor fissure, stable. No acute infiltrate, pleural effusion or pneumothorax. Chronic irregularity of vertebral endplates thoracic spine compatible with sickle cell disease.  IMPRESSION: Enlargement of cardiac silhouette compatible with sickle cell disease. No acute abnormalities.  Original Report Authenticated By: Lollie Marrow, M.D.     1. Sickle cell anemia with crisis   2. UTI (lower urinary tract infection)       MDM  Patient presents with a sickle cell pain crisis. No fevers. She does appear to have a UTI on her urinalysis. No fever. Her pain is now controlled. She  also has some dyspareunia, which she will followup with family practice [facility]. The family practice Center was made aware the patient in ER and he will see her in followup.        Juliet Rude. Rubin Payor, MD 05/04/11 [phone]

## 2011-05-04 NOTE — ED Notes (Signed)
Patient has hx of sickle cell.  She reports onset of crisis with pain in her back and arm today at 95

## 2011-05-04 NOTE — ED Notes (Signed)
Called lab to add on urine culture to urine in lab

## 2011-05-05 ENCOUNTER — Encounter (HOSPITAL_COMMUNITY): Payer: Self-pay | Admitting: Emergency Medicine

## 2011-05-05 ENCOUNTER — Inpatient Hospital Stay (HOSPITAL_COMMUNITY)
Admission: EM | Admit: 2011-05-05 | Discharge: 2011-05-08 | DRG: 812 | Disposition: A | Discharge status: Home / Self Care | Payer: Medicaid Other | Source: Ambulatory Visit | Attending: Family Medicine | Admitting: Family Medicine

## 2011-05-05 DIAGNOSIS — D5700 Hb-SS disease with crisis, unspecified: Secondary | ICD-10-CM

## 2011-05-05 DIAGNOSIS — F329 Major depressive disorder, single episode, unspecified: Secondary | ICD-10-CM | POA: Diagnosis present

## 2011-05-05 DIAGNOSIS — F17200 Nicotine dependence, unspecified, uncomplicated: Secondary | ICD-10-CM | POA: Diagnosis present

## 2011-05-05 DIAGNOSIS — R112 Nausea with vomiting, unspecified: Secondary | ICD-10-CM | POA: Diagnosis present

## 2011-05-05 DIAGNOSIS — D62 Acute posthemorrhagic anemia: Secondary | ICD-10-CM

## 2011-05-05 DIAGNOSIS — A498 Other bacterial infections of unspecified site: Secondary | ICD-10-CM | POA: Diagnosis present

## 2011-05-05 DIAGNOSIS — K219 Gastro-esophageal reflux disease without esophagitis: Secondary | ICD-10-CM | POA: Diagnosis present

## 2011-05-05 DIAGNOSIS — F3289 Other specified depressive episodes: Secondary | ICD-10-CM | POA: Diagnosis present

## 2011-05-05 DIAGNOSIS — M62838 Other muscle spasm: Secondary | ICD-10-CM | POA: Diagnosis present

## 2011-05-05 DIAGNOSIS — N390 Urinary tract infection, site not specified: Secondary | ICD-10-CM | POA: Diagnosis present

## 2011-05-05 NOTE — ED Notes (Signed)
PT. REPORTS SICKLE CELL CRISIS ONSET LAST NIGHT WITH BACK PAIN AND SHOULDER PAIN WITH VOMITING.

## 2011-05-06 ENCOUNTER — Inpatient Hospital Stay (HOSPITAL_COMMUNITY): Payer: Medicaid Other

## 2011-05-06 ENCOUNTER — Emergency Department (HOSPITAL_COMMUNITY): Payer: Medicaid Other

## 2011-05-06 LAB — DIFFERENTIAL
Basophils Absolute: 0.1 10*3/uL (ref 0.0–0.1)
Basophils Relative: 0 % (ref 0–1)
Eosinophils Absolute: 0.7 10*3/uL (ref 0.0–0.7)
Monocytes Relative: 11 % (ref 3–12)
Neutro Abs: 10.6 10*3/uL — ABNORMAL HIGH (ref 1.7–7.7)
Neutrophils Relative %: 68 % (ref 43–77)

## 2011-05-06 LAB — URINE CULTURE

## 2011-05-06 LAB — BASIC METABOLIC PANEL
BUN: 7 mg/dL (ref 6–23)
Chloride: 105 mEq/L (ref 96–112)
GFR calc Af Amer: >90 mL/min (ref >90)
GFR calc non Af Amer: >90 mL/min (ref >90)
Potassium: 4.3 mEq/L (ref 3.5–5.1)
Sodium: 138 mEq/L (ref 135–145)

## 2011-05-06 LAB — CBC
Hemoglobin: 9.2 g/dL — ABNORMAL LOW (ref 12.0–15.0)
MCHC: 34.7 g/dL (ref 30.0–36.0)
Platelets: 541 10*3/uL — ABNORMAL HIGH (ref 150–400)
RBC: 3.09 MIL/uL — ABNORMAL LOW (ref 3.87–5.11)

## 2011-05-06 MED ORDER — DIPHENHYDRAMINE HCL 50 MG/ML IJ SOLN
12.5000 mg | Freq: Four times a day (QID) | INTRAMUSCULAR | Status: DC | PRN
  Administered 2011-05-06: 12.5 mg via INTRAVENOUS
  Filled 2011-05-06: qty 1

## 2011-05-06 MED ORDER — SODIUM CHLORIDE 0.9 % IJ SOLN: 3.0000 mL | INTRAMUSCULAR | Status: DC | PRN

## 2011-05-06 MED ORDER — SODIUM CHLORIDE 0.9 % IV SOLN: 250.0000 mL | INTRAVENOUS | Status: DC | PRN

## 2011-05-06 MED ORDER — NALOXONE HCL 0.4 MG/ML IJ SOLN: 0.4000 mg | INTRAMUSCULAR | Status: DC | PRN

## 2011-05-06 MED ORDER — ONDANSETRON HCL 4 MG/2ML IJ SOLN
4.0000 mg | Freq: Once | INTRAMUSCULAR | Status: AC
  Administered 2011-05-06: 4 mg via INTRAVENOUS
  Filled 2011-05-06: qty 2

## 2011-05-06 MED ORDER — HYDROMORPHONE HCL PF 2 MG/ML IJ SOLN
2.0000 mg | Freq: Once | INTRAMUSCULAR | Status: AC
  Administered 2011-05-06: 2 mg via INTRAVENOUS
  Filled 2011-05-06: qty 1

## 2011-05-06 MED ORDER — SODIUM CHLORIDE 0.9 % IJ SOLN: 9.0000 mL | INTRAMUSCULAR | Status: DC | PRN

## 2011-05-06 MED ORDER — ONDANSETRON HCL 4 MG/2ML IJ SOLN
4.0000 mg | Freq: Four times a day (QID) | INTRAMUSCULAR | Status: DC | PRN
  Administered 2011-05-06 – 2011-05-07 (×3): 4 mg via INTRAVENOUS
  Filled 2011-05-06 (×3): qty 2

## 2011-05-06 MED ORDER — HEPARIN SODIUM (PORCINE) 5000 UNIT/ML IJ SOLN
5000.0000 [IU] | Freq: Three times a day (TID) | INTRAMUSCULAR | Status: DC
  Administered 2011-05-06 – 2011-05-07 (×3): 5000 [IU] via SUBCUTANEOUS
  Filled 2011-05-06 (×10): qty 1

## 2011-05-06 MED ORDER — CYCLOBENZAPRINE HCL 10 MG PO TABS
10.0000 mg | ORAL_TABLET | Freq: Three times a day (TID) | ORAL | Status: DC | PRN
  Administered 2011-05-07: 10 mg via ORAL
  Filled 2011-05-06: qty 1

## 2011-05-06 MED ORDER — CIPROFLOXACIN HCL 500 MG PO TABS
500.0000 mg | ORAL_TABLET | Freq: Two times a day (BID) | ORAL | Status: AC
  Administered 2011-05-06 – 2011-05-07 (×4): 500 mg via ORAL
  Filled 2011-05-06 (×5): qty 1

## 2011-05-06 MED ORDER — SODIUM CHLORIDE 0.9 % IJ SOLN
3.0000 mL | Freq: Two times a day (BID) | INTRAMUSCULAR | Status: DC
  Administered 2011-05-06: 3 mL via INTRAVENOUS

## 2011-05-06 MED ORDER — DIPHENHYDRAMINE HCL 12.5 MG/5ML PO ELIX: 12.5000 mg | ORAL_SOLUTION | Freq: Four times a day (QID) | ORAL | Status: DC | PRN

## 2011-05-06 MED ORDER — HYDROMORPHONE HCL PF 1 MG/ML IJ SOLN
1.0000 mg | Freq: Once | INTRAMUSCULAR | Status: AC
  Administered 2011-05-06: 1 mg via INTRAVENOUS
  Filled 2011-05-06: qty 1

## 2011-05-06 MED ORDER — MORPHINE SULFATE (PF) 1 MG/ML IV SOLN
INTRAVENOUS | Status: DC
  Administered 2011-05-06: 12 mg via INTRAVENOUS
  Administered 2011-05-06: 18:00:00 via INTRAVENOUS
  Administered 2011-05-06: 5 mg via INTRAVENOUS
  Administered 2011-05-06: 08:00:00 via INTRAVENOUS
  Administered 2011-05-06: 9 mg via INTRAVENOUS
  Administered 2011-05-07: 6 mg via INTRAVENOUS
  Administered 2011-05-07: 5 mg via INTRAVENOUS
  Filled 2011-05-06 (×2): qty 25

## 2011-05-06 MED ORDER — SODIUM CHLORIDE 0.9 % IV SOLN
INTRAVENOUS | Status: DC
  Administered 2011-05-06 (×4): via INTRAVENOUS

## 2011-05-06 NOTE — ED Provider Notes (Signed)
History     CSN: [id number]  Arrival date & time 05/05/11  2352   First MD Initiated Contact with Patient 05/05/11 2359      Chief Complaint  Patient presents with  . Sickle Cell Pain Crisis    (Consider location/radiation/quality/duration/timing/severity/associated sxs/prior treatment) Patient is a 20 y.o. female presenting with sickle cell pain. The history is provided by the patient.  Sickle Cell Pain Crisis  This is a chronic problem. Episode onset: Has been going on for about a month but got worse today. The onset was sudden. The problem occurs continuously. The problem has been gradually worsening. The pain is associated with an unknown factor. Pain location: Right-sided back and right arm. Site of pain is localized in muscle. The pain is similar to prior episodes. The pain is severe. The symptoms are relieved by nothing. The symptoms are not relieved by one or more prescription drugs and rest. The symptoms are aggravated by activity and movement. Associated symptoms include nausea, vomiting and back pain. Pertinent negatives include no chest pain, no abdominal pain, no dysuria, no vaginal bleeding, no congestion, no rhinorrhea, no sore throat, no joint pain, no neck pain, no neck stiffness, no loss of sensation, no tingling, no weakness, no cough and no difficulty breathing. The vomiting occurs frequently. The emesis has an appearance of stomach contents. The vomiting is associated with pain. She has been drinking less than usual and eating less than usual. Urine output has been normal. The last void occurred less than 6 hours ago. Her past medical history is significant for chronic pain. Her past medical history does not include chronic back pain. She sickle cell type is SS. There is no history of acute chest syndrome. There have been frequent pain crises. There is no history of stroke. She has been treated with chronic transfusion therapy. She has not been treated with hydroxyurea. There  were no sick contacts. Recently, medical care has been given at this facility.    Past Medical History  Diagnosis Date  . H/O: 1 miscarriage 03/22/2011  . 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 Active smoker 08/09/2010  . Depression 01/06/2011  . GERD (gastroesophageal reflux disease) 02/17/2011  . Trichotillomania     h/o  . Blood transfusion   . Sickle cell anemia with crisis   . Sickle cell anemia   . SICKLE CELL ANEMIA 01/08/2009  . Sickle cell anemia     pf has frequent sickle cell crisis  . Sickle cell anemia   . Sickle cell crisis 04/27/11  . Sickle-cell disease with vaso-occlusive pain     Past Surgical History  Procedure Date  . Cholecystectomy 05/2010  . Dilation and curettage of uterus 02/20/11    No family history on file.  History  Substance Use Topics  . Smoking status: Former Smoker    Types: Cigars    Quit date: 04/14/2011  . Smokeless tobacco: Never Used  . Alcohol Use: No    OB History    Grav Para Term Preterm Abortions TAB SAB Ect Mult Living   1               Review of Systems  HENT: Negative for congestion, sore throat, rhinorrhea and neck pain.   Respiratory: Negative for cough.   Cardiovascular: Negative for chest pain.  Gastrointestinal: Positive for nausea and vomiting. Negative for abdominal pain.  Genitourinary: Negative for dysuria and vaginal bleeding.  Musculoskeletal: Positive for back pain. Negative for joint pain.  Neurological: Negative for tingling and  weakness.  All other systems reviewed and are negative.    Allergies  Toradol  Home Medications   Current Outpatient Rx  Name Route Sig Dispense Refill  . BUPROPION HCL ER (XL) 150 MG PO TB24 Oral Take 1 tablet (150 mg total) by mouth daily. 30 tablet 1  . CIPROFLOXACIN HCL 500 MG PO TABS Oral Take 1 tablet (500 mg total) by mouth 2 (two) times daily. 10 tablet 0  . CYCLOBENZAPRINE HCL 10 MG PO TABS Oral Take 10 mg by mouth 3 (three) times daily as needed. For muscle spasms     .  OXYCODONE-ACETAMINOPHEN 5-325 MG PO TABS Oral Take 1-2 tablets by mouth every 6 (six) hours as needed for pain. 60 tablet 0  . PRENATAL RX 60-1 MG PO TABS Oral Take 1 tablet by mouth daily. 90 tablet 1    BP 103/61  Pulse 102  Temp 98.2 F (36.8 C)  Resp 20  SpO2 100%  Physical Exam  Nursing note and vitals reviewed. Constitutional: She is oriented to person, place, and time. She appears well-developed and well-nourished. She appears distressed.  HENT:  Head: Normocephalic and atraumatic.  Mouth/Throat: Oropharynx is clear and moist.  Eyes: EOM are normal. Pupils are equal, round, and reactive to light.  Cardiovascular: Normal rate, regular rhythm, normal heart sounds and intact distal pulses.  Exam reveals no friction rub.   No murmur heard. Pulmonary/Chest: Effort normal and breath sounds normal. She has no wheezes. She has no rales.  Abdominal: Soft. Bowel sounds are normal. She exhibits no distension. There is no tenderness. There is no rebound and no guarding.  Musculoskeletal: Normal range of motion. She exhibits no tenderness.       Thoracic back: She exhibits tenderness and pain. She exhibits no bony tenderness.       Back:       Right upper arm: She exhibits tenderness. She exhibits no bony tenderness, no swelling and no deformity.       Arms:      No edema  Neurological: She is alert and oriented to person, place, and time. No cranial nerve deficit.  Skin: Skin is warm and dry. No rash noted.  Psychiatric: She has a normal mood and affect. Her behavior is normal.    ED Course  Procedures (including critical care time)   Labs Reviewed  CBC  DIFFERENTIAL  BASIC METABOLIC PANEL   Dg Chest 2 View  05/04/2011  *RADIOLOGY REPORT*  Clinical Data: Cough, back pain, chest pain, sickle cell disease  CHEST - 2 VIEW  Comparison: 04/19/2004  Findings: Enlargement of cardiac silhouette. Mediastinal contours and pulmonary vascularity normal. Slight volume loss in the right  upper lobe with superior retraction of the minor fissure, stable. No acute infiltrate, pleural effusion or pneumothorax. Chronic irregularity of vertebral endplates thoracic spine compatible with sickle cell disease.  IMPRESSION: Enlargement of cardiac silhouette compatible with sickle cell disease. No acute abnormalities.  Original Report Authenticated By: Lollie Marrow, M.D.     1. Sickle cell pain crisis       MDM   Patient with a history of sickle cell disease who has been here 6 times in December last admitted and discharged on December 12. Seen in the ER 2 days ago and given IV meds and sent home. She states that she has been laying in bed and doing well but the pain came back today and is not controlled with the Percocet she has at home and she also  began vomiting today. She denies any fever, shortness of breath, cough, abdominal pain, urinary symptoms. Her last transfusion was 2 months ago. All of her pain is on the right back and arm which she states it's been there all of December. No joint pain suggestive of hemarthrosis and no trauma. Will give pain control and recheck.  1:34 AM Labs are stable with a stable white blood cell count and hemoglobin. Patient still complaining of pain and will continue with pain control.  3:48 AM  Unable to get pain controlled and will admit to the hospital.       Gwyneth Sprout, MD 05/06/11 [id number]

## 2011-05-06 NOTE — H&P (Signed)
Family Medicine Teaching Service Attending Note  I interviewed and examined patient [name] and reviewed their tests and x-rays.  I discussed with Dr. Edmonia James and reviewed their note for today.  I agree with their assessment and plan.     Additionally  Currently complains of pain in shoulder and upper back.  No chest pain or shortness of breath or fever.  Pain is consistent with her previous crises No significant drop in hgb or signs of acute chest Treat for pain relief and monitor for complications

## 2011-05-06 NOTE — H&P (Signed)
Family Medicine Teaching [facility] Admission History and Physical  Patient name: [name] Medical record number: [id number] Date of birth: 01-23-1991 Age: 20 y.o. Gender: female  Primary Care Provider: [name], MD  Chief Complaint: Right arm and right upper back pain  History of Present Illness: [name] is a 20 y.o. year old female presenting with right arm and right upper back pain.  Pt states that pain is 9/10 and feels like typical sickle cell pain crisis.  Pain started yesterday in right arm- was seen in ER and discharged.  Pt back today with worse pain.  Pain intensified at 10:30pm in the evening.  Pt came to the ER for eval.  + nausea.  +vomiting x1.  No diarrhea.  No fever. No rash.  No sob.  No chest pain.  No other ext pain.   Patient Active Problem List  Diagnoses  . SICKLE CELL ANEMIA  . TRICHOTILLOMANIA  . DRY SKIN  . Active smoker  . Back pain  . Depression  . GERD (gastroesophageal reflux disease)  . Contraception management  . Sickle-cell disease with vaso-occlusive pain  . Muscle spasm of back  . Stress   Past Medical History: Past Medical History  Diagnosis Date  . H/O: 1 miscarriage 03/22/2011  . 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 Active smoker 08/09/2010  . Depression 01/06/2011  . GERD (gastroesophageal reflux disease) 02/17/2011  . Trichotillomania     h/o  . Blood transfusion   . Sickle cell anemia with crisis   . Sickle cell anemia   . SICKLE CELL ANEMIA 01/08/2009  . Sickle cell anemia     pf has frequent sickle cell crisis  . Sickle cell anemia   . Sickle cell crisis 04/27/11  . Sickle-cell disease with vaso-occlusive pain     Past Surgical History: Past Surgical History  Procedure Date  . Cholecystectomy 05/2010  . Dilation and curettage of uterus 02/20/11    Social History: History   Social History  . Marital Status: Single    Spouse Name: N/A    Number of Children: N/A  . Years of Education: N/A   Social  History Main Topics  . Smoking status: Former Smoker    Types: Cigars    Quit date: 04/14/2011  . Smokeless tobacco: Never Used  . Alcohol Use: No  . Drug Use: Yes    Special: Marijuana     marijuana  . Sexually Active: Yes   Other Topics Concern  . None   Social History Narrative  . None    Family History: No significant family history  Allergies: Allergies  Allergen Reactions  . Toradol Hives and Itching    Some itching, give with benedryl    Current Outpatient Prescriptions  Medication Sig Dispense Refill  . buPROPion (WELLBUTRIN XL) 150 MG 24 hr tablet Take 1 tablet (150 mg total) by mouth daily.  30 tablet  1  . ciprofloxacin (CIPRO) 500 MG tablet Take 1 tablet (500 mg total) by mouth 2 (two) times daily.  10 tablet  0  . cyclobenzaprine (FLEXERIL) 10 MG tablet Take 10 mg by mouth 3 (three) times daily as needed. For muscle spasms       . oxyCODONE-acetaminophen (ROXICET) 5-325 MG per tablet Take 1-2 tablets by mouth every 6 (six) hours as needed for pain.  60 tablet  0  . Prenatal Vit-Fe Fumarate-FA (PRENATAL MULTIVITAMIN) 60-1 MG tablet Take 1 tablet by mouth daily.  90 tablet  1   Review  Of Systems: Per HPI with the following additions--Otherwise 12 point review of systems was performed and was unremarkable.  Physical Exam: Pulse: 111  Blood Pressure: 106/43 RR: 18   O2: 96% on RA Temp: 98.2  General: alert and cooperative--tearful HEENT: PERRLA, neck supple with midline trachea and mucous membranes moist Heart: S1, S2 normal, no murmur, rub or gallop, regular rate and rhythm Lungs: clear to auscultation, no wheezes or rales and unlabored breathing Abdomen: abdomen is soft without significant tenderness, masses, organomegaly or guarding Extremities: extremities normal, atraumatic, no cyanosis or edema Skin:no rashes Neurology: mental status, speech normal, alert and oriented x3 and PERLA  Labs and Imaging: Lab Results  Component Value Date/Time   NA 138  05/06/2011 12:07 AM   K 4.3 05/06/2011 12:07 AM   CL 105 05/06/2011 12:07 AM   CO2 22 05/06/2011 12:07 AM   BUN 7 05/06/2011 12:07 AM   CREATININE 0.56 05/06/2011 12:07 AM   GLUCOSE 87 05/06/2011 12:07 AM   Lab Results  Component Value Date   WBC 15.6* 05/06/2011   HGB 9.2* 05/06/2011   HCT 26.5* 05/06/2011   MCV 85.8 05/06/2011   PLT 541* 05/06/2011   CXR- pending   Assessment and Plan: [name] is a 20 y.o. year old female presenting with right arm and right upper back pain:  1. Right arm and right upper back pain: Pt reports that this pain is consistent with her sickle cell pain.  Hbg at baseline.  CXR pending to r/o any infiltrate that could indicate acute chest- want to make sure that lung infiltrate is not underlying this intense right upper back pain. Pulse ox- wnl.  Will treat with morphine pca pump.  Will give zofran for nausea.  Will continue flexeril prn.  Will monitor symptoms closely.   2. Nausea/vomiting: zofran as needed.  NS 125cc/hr until pt tolerating po's without n/v.  3. Depression: Continue Wellbutrin per home regimen  4. FEN/GI: advance diet as tolerated  5. Prophylaxis: heparin 5000units SQ TID  6. Disposition: pending clinical improvement.    [name], MD PGY-3 [phone]

## 2011-05-07 LAB — BASIC METABOLIC PANEL
BUN: 5 mg/dL — ABNORMAL LOW (ref 6–23)
CO2: 23 mEq/L (ref 19–32)
Chloride: 106 mEq/L (ref 96–112)
Chloride: 106 mEq/L (ref 96–112)
GFR calc Af Amer: >90 mL/min (ref >90)
GFR calc Af Amer: >90 mL/min (ref >90)
GFR calc non Af Amer: >90 mL/min (ref >90)
Glucose, Bld: 90 mg/dL (ref 70–99)
Potassium: 3.5 mEq/L (ref 3.5–5.1)
Potassium: 3.7 mEq/L (ref 3.5–5.1)
Sodium: 137 mEq/L (ref 135–145)

## 2011-05-07 LAB — CBC
HCT: 22 % — ABNORMAL LOW (ref 36.0–46.0)
HCT: 21.9 % — ABNORMAL LOW (ref 36.0–46.0)
Hemoglobin: 7.7 g/dL — ABNORMAL LOW (ref 12.0–15.0)
Hemoglobin: 7.7 g/dL — ABNORMAL LOW (ref 12.0–15.0)
MCV: 85.9 fL (ref 78.0–100.0)
RBC: 2.56 MIL/uL — ABNORMAL LOW (ref 3.87–5.11)
RBC: 2.58 MIL/uL — ABNORMAL LOW (ref 3.87–5.11)
RDW: 17 % — ABNORMAL HIGH (ref 11.5–15.5)
WBC: 12.5 10*3/uL — ABNORMAL HIGH (ref 4.0–10.5)
WBC: 12.2 10*3/uL — ABNORMAL HIGH (ref 4.0–10.5)

## 2011-05-07 MED ORDER — ONDANSETRON HCL 4 MG/2ML IJ SOLN: 4.0000 mg | Freq: Four times a day (QID) | INTRAMUSCULAR | Status: DC | PRN

## 2011-05-07 MED ORDER — NALOXONE HCL 0.4 MG/ML IJ SOLN: 0.4000 mg | INTRAMUSCULAR | Status: DC | PRN

## 2011-05-07 MED ORDER — DIPHENHYDRAMINE HCL 12.5 MG/5ML PO ELIX
12.5000 mg | ORAL_SOLUTION | Freq: Four times a day (QID) | ORAL | Status: DC | PRN
  Filled 2011-05-07: qty 5

## 2011-05-07 MED ORDER — IBUPROFEN 400 MG PO TABS
400.0000 mg | ORAL_TABLET | Freq: Four times a day (QID) | ORAL | Status: DC | PRN
  Filled 2011-05-07 (×2): qty 1

## 2011-05-07 MED ORDER — OXYCODONE-ACETAMINOPHEN 5-325 MG PO TABS
1.0000 | ORAL_TABLET | ORAL | Status: DC | PRN
  Administered 2011-05-07: 2 via ORAL
  Filled 2011-05-07: qty 2

## 2011-05-07 MED ORDER — SODIUM CHLORIDE 0.9 % IJ SOLN: 9.0000 mL | INTRAMUSCULAR | Status: DC | PRN

## 2011-05-07 MED ORDER — MORPHINE SULFATE (PF) 1 MG/ML IV SOLN
INTRAVENOUS | Status: DC
  Administered 2011-05-07: 06:00:00 via INTRAVENOUS
  Filled 2011-05-07: qty 25

## 2011-05-07 MED ORDER — OXYCODONE-ACETAMINOPHEN 5-325 MG PO TABS
1.0000 | ORAL_TABLET | ORAL | Status: DC | PRN
  Administered 2011-05-07 – 2011-05-08 (×4): 2 via ORAL
  Filled 2011-05-07 (×5): qty 2

## 2011-05-07 MED ORDER — DIPHENHYDRAMINE HCL 50 MG/ML IJ SOLN: 12.5000 mg | Freq: Four times a day (QID) | INTRAMUSCULAR | Status: DC | PRN

## 2011-05-07 MED ORDER — ONDANSETRON HCL 4 MG PO TABS
4.0000 mg | ORAL_TABLET | Freq: Three times a day (TID) | ORAL | Status: DC | PRN
Start: 2011-05-07 — End: 2011-05-08
  Administered 2011-05-07 – 2011-05-08 (×2): 4 mg via ORAL
  Filled 2011-05-07 (×2): qty 1

## 2011-05-07 MED ORDER — DIPHENHYDRAMINE HCL 25 MG PO CAPS: 25.0000 mg | ORAL_CAPSULE | Freq: Four times a day (QID) | ORAL | Status: DC | PRN

## 2011-05-07 NOTE — Progress Notes (Signed)
Md. Notified about pts. Decreased blood pressure. New orders placed. [name] [name] [name]

## 2011-05-07 NOTE — Discharge Summary (Signed)
Physician Discharge Summary  Patient ID: [name] MRN: [id number] DOB/AGE: January 08, 1991 20 y.o.  Admit date: 05/05/2011 Discharge date: 05/07/2011  Admission Diagnoses: Sickle cell pain crisis Muscle spasm UTI Discharge Diagnoses:  Sickle cell pain crisis Muscle spasm UTI  Discharged Condition: good  Hospital Course: Patient was admitted for presumed sickle cell pain crisis. Her pain was in her right shoulder and arm. This is where pain has been for her last 2 admissions. She had a shoulder x-ray that did not show any bone infarct in this area. This seems to be related to muscle spasm and is often improved with heat and massage. Patient was on a morphine PCA x2 days and that she lost her IV. Her pain was well controlled, she was eating well, and she was up and walking around. On the day before admission she lost her IV, and so she was transitioned to by mouth pain medicine. She did well through the day on by mouth medication, though she had an episode of pain in her right arm. Her pain seems to be affected by her mood and she seems to be more labile than in previous admissions. We discussed using massage, Percocet, heat as well as Flexeril to help with these muscle spasms. Patient has been seen by social worker previous admissions for suggestions regarding her depression and life stressors. These are stable in this admission.  Patient was also treated for a UTI that was found at an ED visit 1219. Her urine culture grew out Escherichia coli sensitive to Cipro Consults: None  Significant Diagnostic Studies:  X-ray Chest Pa And Lateral   05/06/2011  *RADIOLOGY REPORT*  Clinical Data: Sickle cell disease, right shoulder and back pain.  CHEST - 2 VIEW  Comparison: 05/04/2011  Findings: No interval change.  Heart size upper normal limits to mildly enlarged.  No focal consolidation.  No pleural effusion or pneumothorax.  Surgical clips right upper quadrant.  Sclerotic changes of the  humeral heads and multiple vertebral body endplate irregularities, in keeping with known sickle cell disease.  IMPRESSION: Heart size upper normal limits to mildly enlarged.  No focal consolidation.  Osseous changes of sickle cell disease involving the humeral heads and vertebral bodies.  Original Report Authenticated By: Waneta Martins, M.D.   Dg Shoulder Right  05/06/2011  *RADIOLOGY REPORT*  Clinical Data: Right shoulder pain  RIGHT SHOULDER - 2+ VIEW  Comparison: None.  Findings: No evidence for fracture.  No shoulder separation or dislocation.  No changes in the proximal humerus to suggest bone infarct.  IMPRESSION: Normal exam.  Original Report Authenticated By: ERIC A. MANSELL, M.D.    Treatments: Patient was treated with morphine PCA and then transitioned to by mouth Percocet.  Discharge Exam: Blood pressure 109/68, pulse 84, temperature 97.7 F (36.5 C), temperature source Oral, resp. rate 18, height 5\' 2"  (1.575 m), weight 166 lb 10.7 oz (75.6 kg), SpO2 95.00%. General appearance: sleeping but easily awakened Back: range of motion normal, symmetric, no curvature. ROM normal. No CVA tenderness., She was able to turn over in her sleep without pain Resp: clear to auscultation bilaterally Chest wall: no tenderness Cardio: regular rate and rhythm, S1, S2 normal, no murmur, click, rub or gallop GI: soft, non-tender; bowel sounds normal; no masses,  no organomegaly Extremities: extremities normal, atraumatic, no cyanosis or edema Skin: Skin color, texture, turgor normal. No rashes or lesions  Disposition: Home or Self Care  Discharge Orders    Future Appointments: Provider: Department: Dept Phone:  Center:   05/13/2011 2:00 PM [name] [facility] [phone] [facility]   05/26/2011 10:30 AM [name] [facility] [phone] [facility]     Current Discharge Medication List    CONTINUE these medications which have NOT CHANGED   Details  buPROPion (WELLBUTRIN XL) 150 MG  24 hr tablet Take 1 tablet (150 mg total) by mouth daily. Qty: 30 tablet, Refills: 1    cyclobenzaprine (FLEXERIL) 10 MG tablet Take 10 mg by mouth 3 (three) times daily as needed. For muscle spasms     oxyCODONE-acetaminophen (ROXICET) 5-325 MG per tablet Take 1-2 tablets by mouth every 6 (six) hours as needed for pain. Qty: 60 tablet, Refills: 0    Prenatal Vit-Fe Fumarate-FA (PRENATAL MULTIVITAMIN) 60-1 MG tablet Take 1 tablet by mouth daily. Qty: 90 tablet, Refills: 1      STOP taking these medications     ciprofloxacin (CIPRO) 500 MG tablet        Follow-up Information    Follow up with CAVINESS,DAWN. Make an appointment in 1 week.         SignedEllery Plunk 05/07/2011, 4:58 PM

## 2011-05-07 NOTE — Progress Notes (Signed)
Patient seen and examined by me, she reports continued R arm pain.  Lost iv access.  Will continue to manage with oral analgesia and follow closely.  Paula Compton, M.D.

## 2011-05-07 NOTE — Progress Notes (Signed)
FMTS Daily R3 Progress Note  Subjective: Pt has been eating well this AM.  She complains of pain but is up and walking around.  Her IV dislodged and the IV team was unable to place a new one.    Pt had an episode of right arm pain that was acute.  She states she heard a pop and had intense pain that was relieved by percocet and massage.  She agrees to heating pad to area.  I have reviewed the patient's medications.  Objective Temp:  [98.1 F (36.7 C)-98.4 F (36.9 C)] 98.1 F (36.7 C) (12/22 0700) Pulse Rate:  [68-89] 77  (12/22 0700) Resp:  [18-20] 20  (12/22 0700) BP: (84-106)/(51-62) 106/62 mmHg (12/22 0700) SpO2:  [94 %-99 %] 95 % (12/22 0700) FiO2 (%):  [0 %] 0 % (12/22 0014)   Intake/Output Summary (Last 24 hours) at 05/07/11 0858 Last data filed at 05/06/11 2104  Gross per 24 hour  Intake 1481.08 ml  Output      0 ml  Net 1481.08 ml    CBG (last 3)  No results found for this basename: GLUCAP:3 in the last 72 hours  General:initally comfortable and then tearful with arm pain and then calm HEENT: mmm CV: RRR no murmur Pulm: CTAB Abd: soft, nontender Ext: right should with pain over deltoid, no bulges, full ROM but limited by pain, strength limited by pain Neuro: A and O x3  Labs and Imaging  Lab 05/07/11 0700 05/07/11 0054 05/06/11 0007  WBC 12.2* 12.5* 15.6*  HGB 7.7* 7.7* 9.2*  HCT 21.9* 22.0* 26.5*  PLT 484* 457* 541*     Lab 05/07/11 0700 05/07/11 0054 05/06/11 0007  NA 137 137 138  K 3.7 3.5 4.3  CL 106 106 105  CO2 24 23 22   BUN 5* 5* 7  CREATININE 0.58 0.61 0.56  LABGLOM -- -- --  GLUCOSE 90 -- --  CALCIUM 9.2 8.7 9.2    X-ray Chest Pa And Lateral   05/06/2011  *RADIOLOGY REPORT*  Clinical Data: Sickle cell disease, right shoulder and back pain.  CHEST - 2 VIEW  Comparison: 05/04/2011  Findings: No interval change.  Heart size upper normal limits to mildly enlarged.  No focal consolidation.  No pleural effusion or pneumothorax.  Surgical  clips right upper quadrant.  Sclerotic changes of the humeral heads and multiple vertebral body endplate irregularities, in keeping with known sickle cell disease.  IMPRESSION: Heart size upper normal limits to mildly enlarged.  No focal consolidation.  Osseous changes of sickle cell disease involving the humeral heads and vertebral bodies.  Original Report Authenticated By: [name], M.D.   Dg Shoulder Right  05/06/2011  *RADIOLOGY REPORT*  Clinical Data: Right shoulder pain  RIGHT SHOULDER - 2+ VIEW  Comparison: None.  Findings: No evidence for fracture.  No shoulder separation or dislocation.  No changes in the proximal humerus to suggest bone infarct.  IMPRESSION: Normal exam.  Original Report Authenticated By: ERIC A. MANSELL, M.D.   Richelle ItoIvan [name] sensitive to cipro  Assessment and Plan [name] is a 20 y.o. year old female presenting with right arm and right upper back pain:   1. Right arm and right upper back pain:  Pt reports that this pain is consistent with her sickle cell pain. Hbg at baseline. CXR stable.  Shoulder xray with no acute process or infarct.  Pulse ox- wnl. Morphine PCA d/c'd due to pt activity level, IV dislodged.  Transition  to PO percocet PRN.  Continue flexiril.   Shoulder pain MSK related, quickly resolved with percocet and massage.    2. Nausea/vomiting:  Resolved.  Tolerating PO. 3. Depression:  Continue Wellbutrin per home regimen  4. UTI:  On cipro, today is day 3/3 5. FEN/GI: eating well, IV dislodged so IVF d/c'd 6. Prophylaxis: heparin 5000units SQ TID  7. Disposition: likely d/c in AM tomorrow with home meds.     [name] Pager: [phone] 05/07/2011, 8:58 AM

## 2011-05-08 ENCOUNTER — Encounter (HOSPITAL_COMMUNITY): Payer: Self-pay | Admitting: *Deleted

## 2011-05-08 LAB — CBC
MCV: 84.8 fL (ref 78.0–100.0)
Platelets: 506 10*3/uL — ABNORMAL HIGH (ref 150–400)
RBC: 2.82 MIL/uL — ABNORMAL LOW (ref 3.87–5.11)
RDW: 16.4 % — ABNORMAL HIGH (ref 11.5–15.5)
WBC: 12.5 10*3/uL — ABNORMAL HIGH (ref 4.0–10.5)

## 2011-05-08 LAB — BASIC METABOLIC PANEL
Chloride: 104 mEq/L (ref 96–112)
Creatinine, Ser: 0.67 mg/dL (ref 0.50–1.10)
GFR calc Af Amer: >90 mL/min (ref >90)
Sodium: 140 mEq/L (ref 135–145)

## 2011-05-08 NOTE — Discharge Summary (Signed)
FMTS Attending Progress Note  Patient seen by me at 11:05am; she is sleeping comfortably when I enter, easily awakened.  Still reports some pain.  No objective measures that indicate underlying infectious process or worsening anemia.  Plan for discharge later today.   Paula Compton, M.D.

## 2011-05-08 NOTE — Progress Notes (Signed)
Called to patient room at 0006 on 05/08/11, patient was sitting in bathroom refusing to speak, she had shoved her tray against the wall spilling everything on tray.  Patient's boyfriend stated she was mad because her food was not right from [employer] and she had not received her supper tray earlier.  Patient would not speak to the nurse or tech.  Patient was offered food from the grill but refused anything from the grill.  Will continue to monitor.

## 2011-05-08 NOTE — Progress Notes (Signed)
At approximately 0001 on 05/08/11 A loud crashing sound was heard just outside the patient's door.  When the room was entered into the boyfriend was sitting on the recliner and the bedside table was turned over on the other side of the bed.  The patient was in the restroom sitting in the dark.  She would not answer any questions.  The boyfriend stated that she got mad and shoved her food tray against the wall  because she did not receive her dinner tray and the [employer] food that he had brought her had not been made right; the patient's primary nurse was called.  Will continue to monitor. Traniece Boffa, Joan Mayans, RN

## 2011-05-08 NOTE — Progress Notes (Signed)
Removed pca from patient room, order had been discontinued earlier in day.  Wasted 23 cc of morphine in sink.  Witnessed by Irena Reichmann.

## 2011-05-08 NOTE — Progress Notes (Signed)
Pt being discharged today home,no IV site, discharge instructions given and reviewed . [name] 05/08/11

## 2011-05-13 ENCOUNTER — Ambulatory Visit (INDEPENDENT_AMBULATORY_CARE_PROVIDER_SITE_OTHER): Payer: Medicaid Other | Admitting: Family Medicine

## 2011-05-13 ENCOUNTER — Encounter: Payer: Self-pay | Admitting: Family Medicine

## 2011-05-13 DIAGNOSIS — E663 Overweight: Secondary | ICD-10-CM

## 2011-05-13 DIAGNOSIS — D571 Sickle-cell disease without crisis: Secondary | ICD-10-CM

## 2011-05-13 DIAGNOSIS — Z733 Stress, not elsewhere classified: Secondary | ICD-10-CM

## 2011-05-13 DIAGNOSIS — F439 Reaction to severe stress, unspecified: Secondary | ICD-10-CM

## 2011-05-13 IMAGING — CT CT ABD-PELV W/ CM
1 of 2 series · 15 of 32 positions shown, 19 images · IV contrast (APPLIED)
Comparison: 100 ml [id number].

CLINICAL DATA: Sickle cell disease.  Abdominal pain and nausea.
Question abscess.  Status post cholecystectomy 4 weeks ago.

CT ABDOMEN AND PELVIS WITH CONTRAST
TECHNIQUE: Multidetector CT imaging of the abdomen and pelvis was
performed following the standard protocol during bolus
administration of intravenous contrast.
Contrast: Chest and two views abdomen this same date.  CT abdomen
and pelvis 03/04/2010.

[Series 2: abd/pelv with 5.0 b31f st · axial · 0.68mm/px · z∈[-422,+8]mm · 15 of 94 slices shown, 19 images]
[im 4/94  soft-tissue]
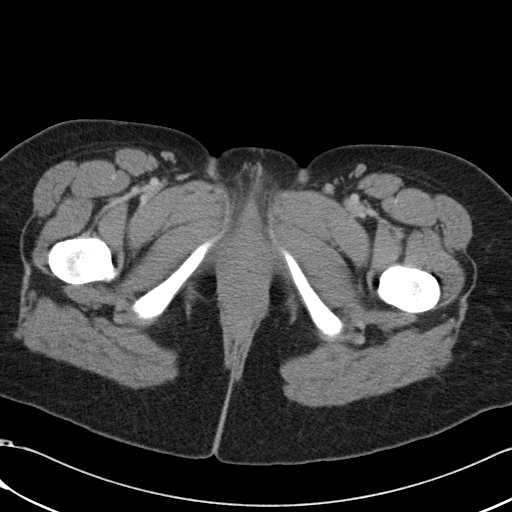
[im 4/94  bone]
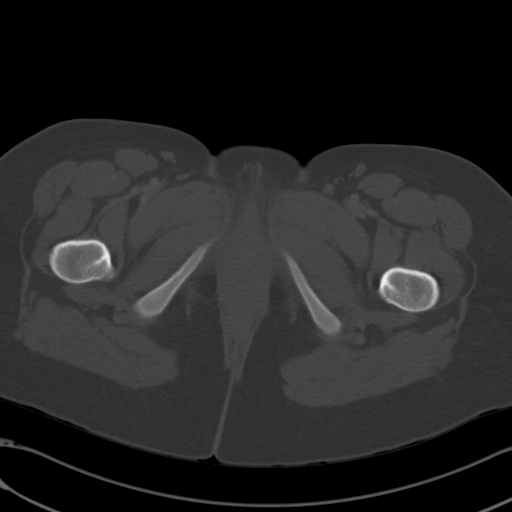
[im 12/94  soft-tissue]
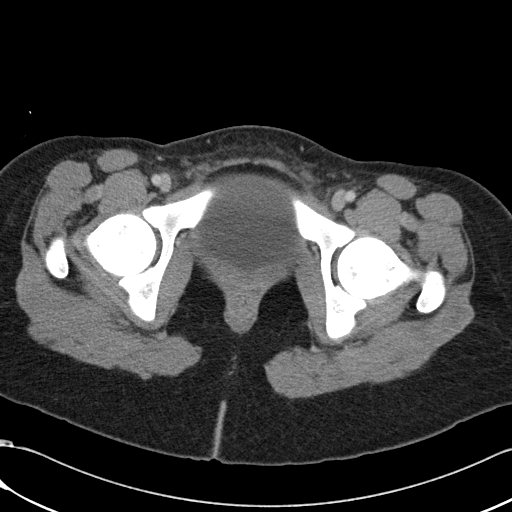
[im 20/94  soft-tissue]
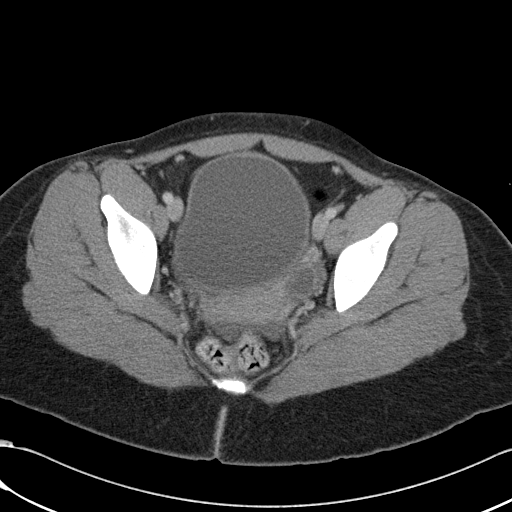
[im 28/94  soft-tissue]
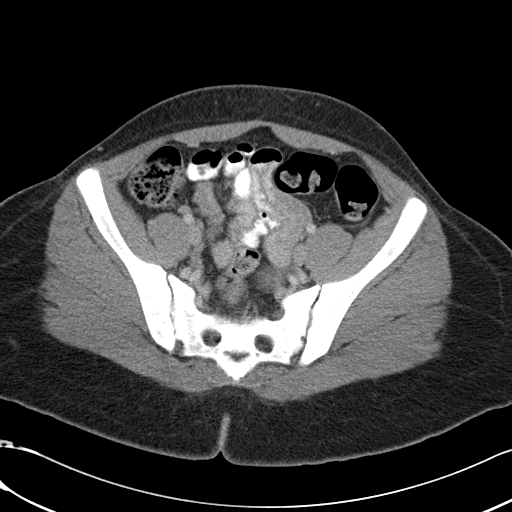
[im 32/94  soft-tissue]
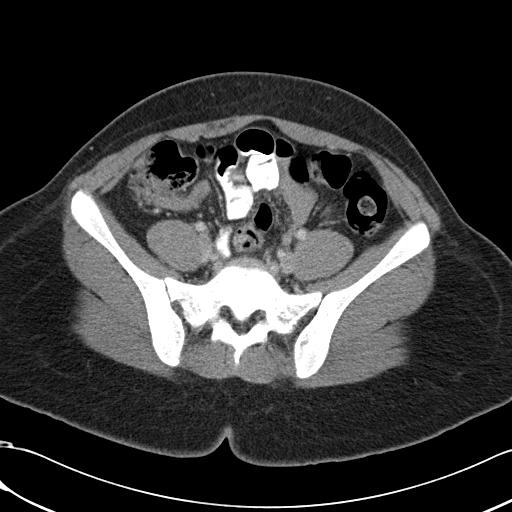
[im 39/94  soft-tissue]
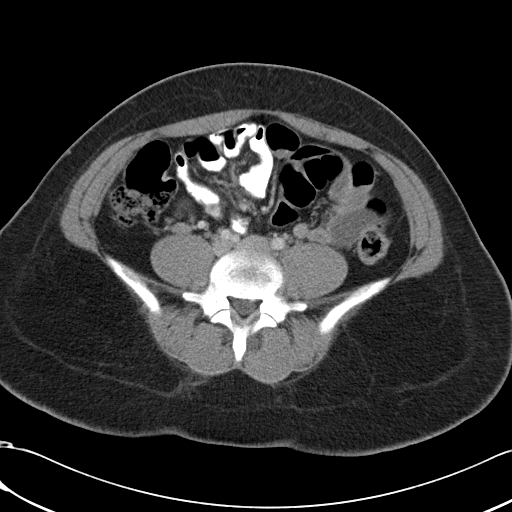
[im 47/94  soft-tissue]
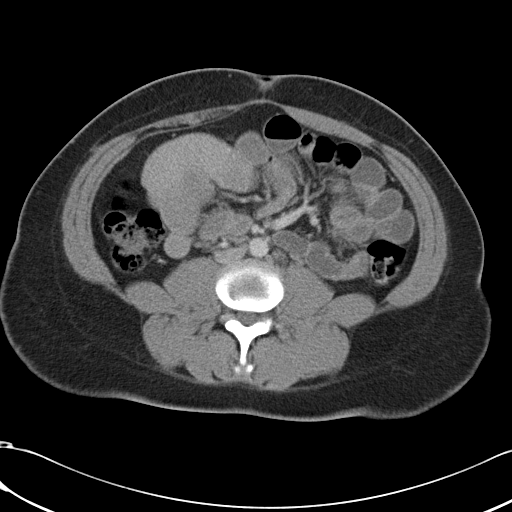
[im 55/94  soft-tissue]
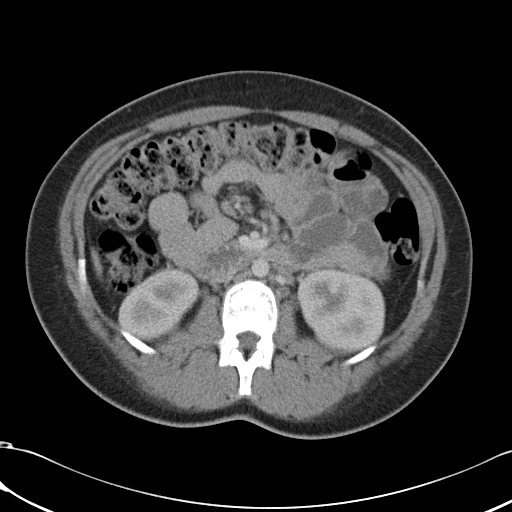
[im 63/94  soft-tissue]
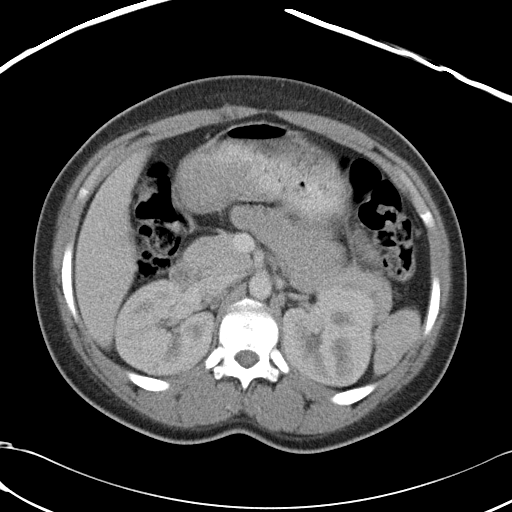
[im 63/94  bone]
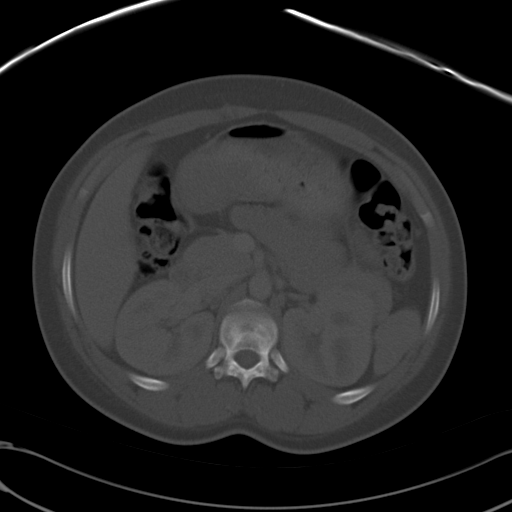
[im 66/94  soft-tissue]
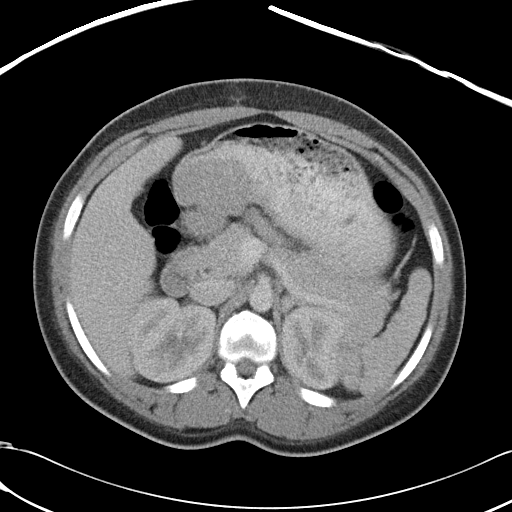
[im 74/94  soft-tissue]
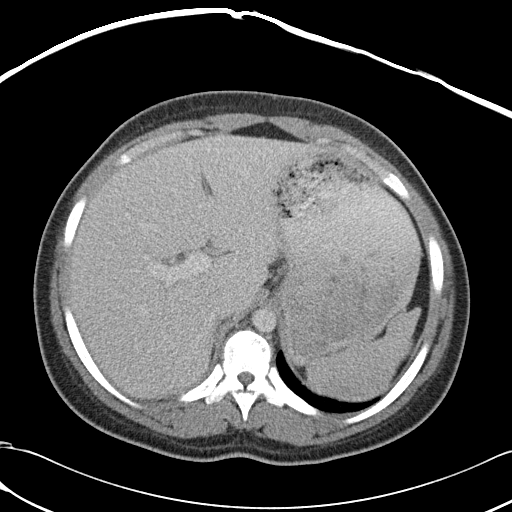
[im 78/94  lung]
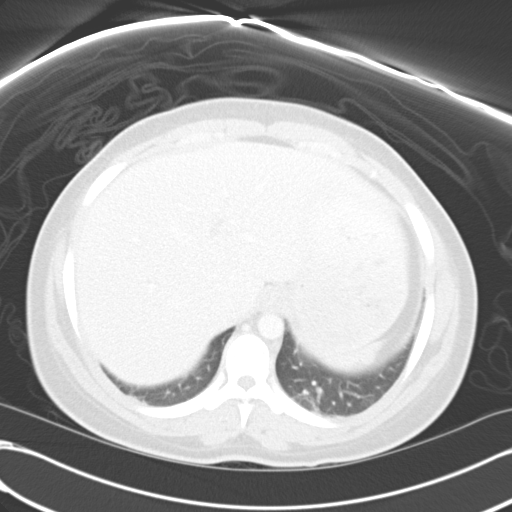
[im 82/94  soft-tissue]
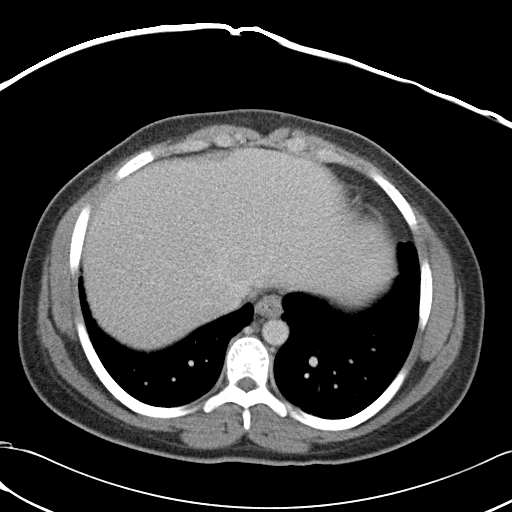
[im 82/94  lung]
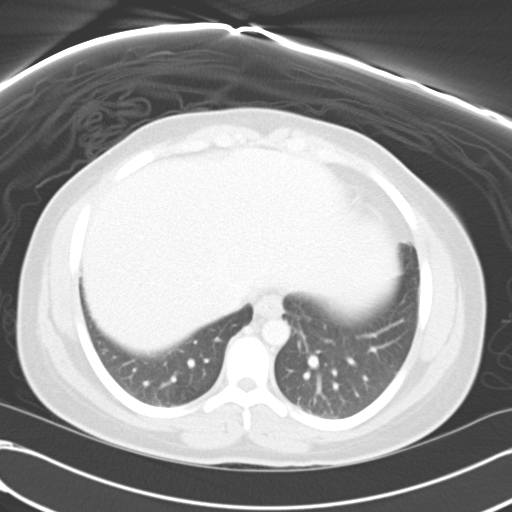
[im 86/94  lung]
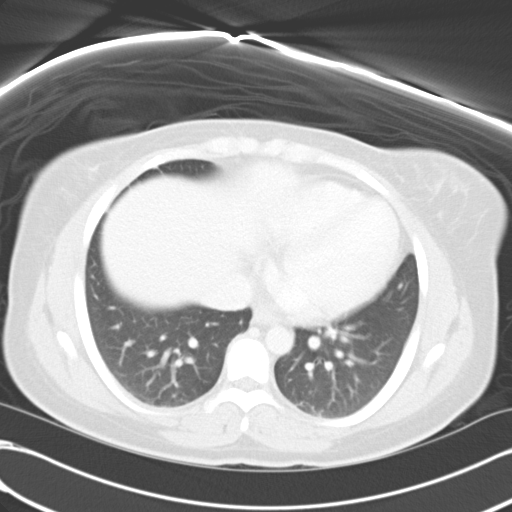
[im 90/94  soft-tissue]
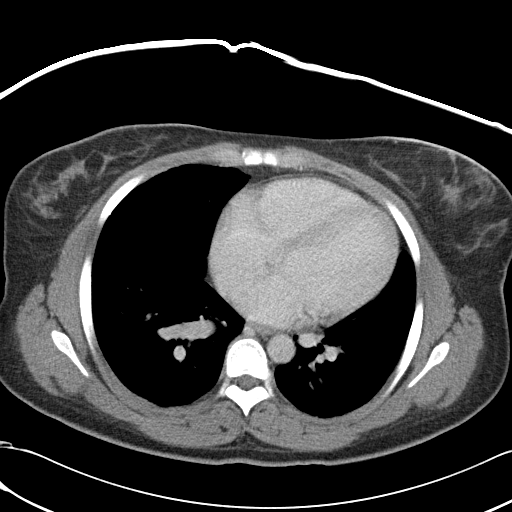
[im 90/94  lung]
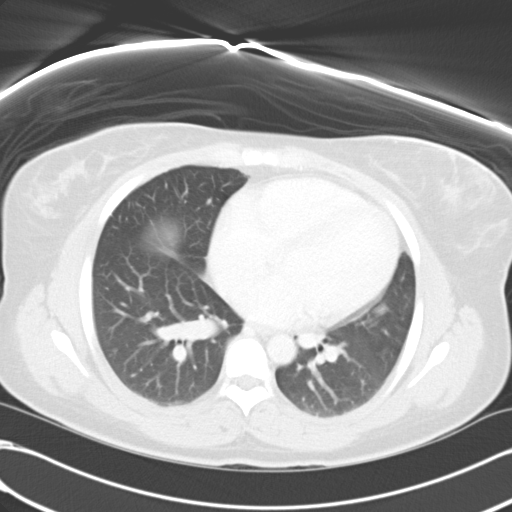

[15 of 32 positions shown; findings below may reference images not displayed]

FINDINGS: Mild dependent atelectasis is seen in the lung bases.  No
pleural or pericardial effusion.

The patient is status post cholecystectomy.  No abnormal fluid
collection is identified in the gallbladder fossa.  No fluid
collection to suggest abscess is seen elsewhere the abdomen.  A
small amount of free pelvic fluid compatible with physiologic
change is noted.  There is no intra or extrahepatic biliary ductal
dilatation.  The liver, adrenal glands, spleen, pancreas and
kidneys appear normal.  Small lymph nodes are seen in the right
lower quadrant and root the mesentery, unchanged. The appendix is
not  discretely visualized.  Uterus, adnexa and urinary bladder are
unremarkable.  Bones  demonstrate abnormal sclerosis in the femoral
heads, worse on the left consistent with avascular necrosis.
Multifocal bone infarcts identified.
IMPRESSION: 1.  Negative for abscess or other acute abnormality.
2.  Status post cholecystectomy without evidence of complication.
3.  Multi focal bone infarcts.

## 2011-05-13 IMAGING — CR DG CHEST 2V
2 series · 2 of 2 positions shown · non-contrast
Comparison: Chest 05/16/2010 and 03/08/2009.

CLINICAL DATA: Sickle cell disease.  Chest pain.

CHEST - 2 VIEW

[w chest pa]
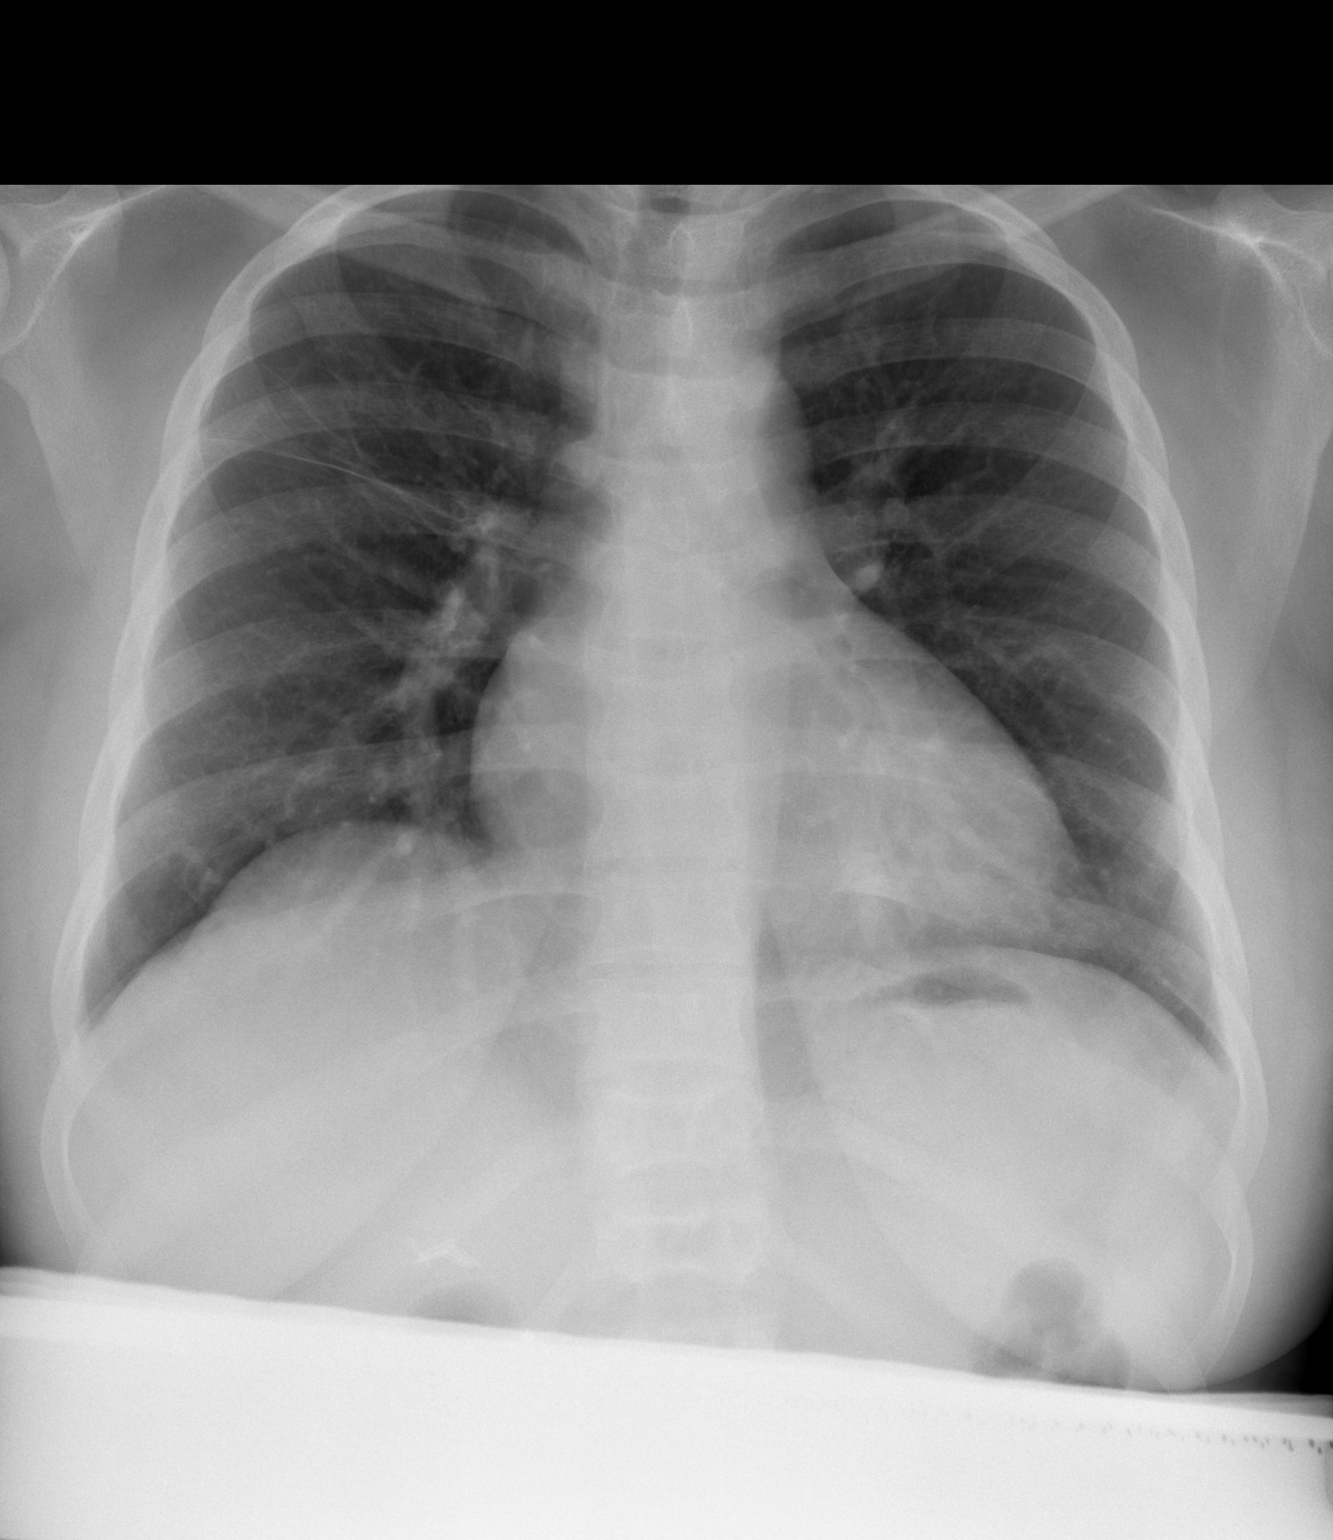

[w chest lat]
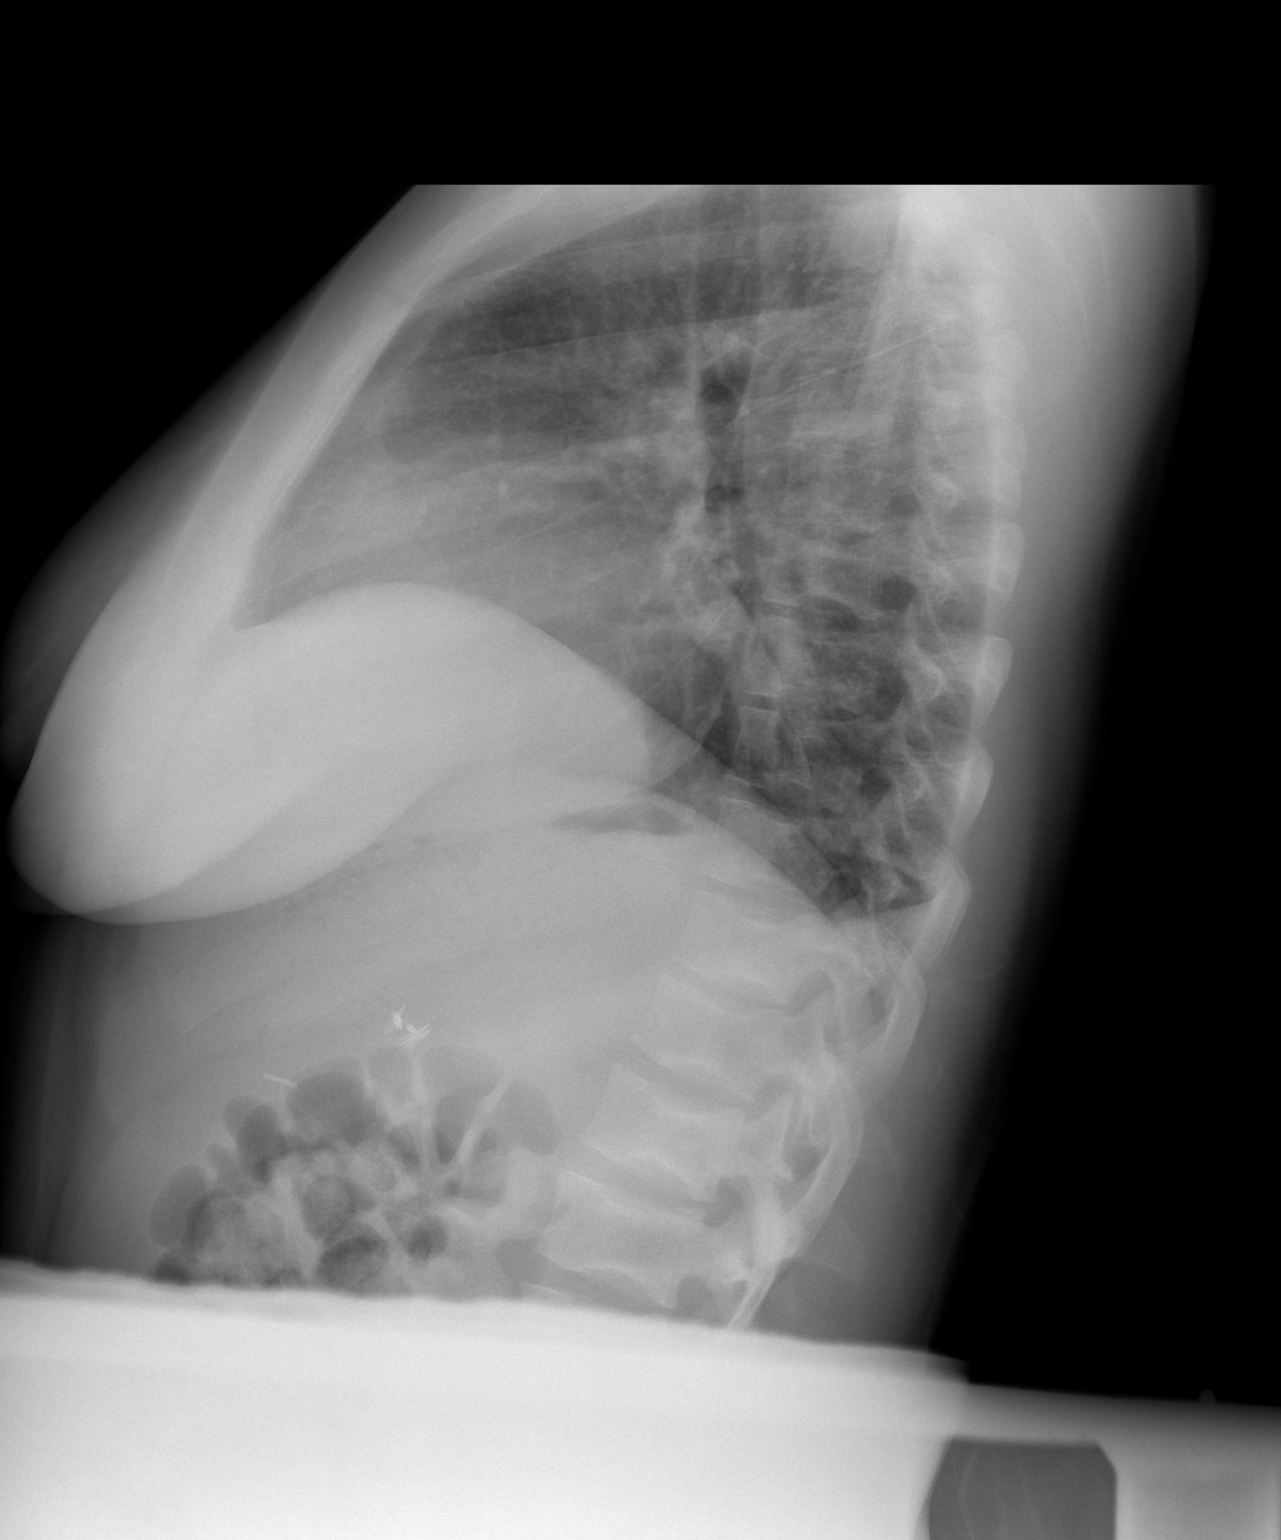

[2 of 2 positions shown; findings below may reference images not displayed]

FINDINGS: Linear airspace opacity the right upper lobe consistent
with scars unchanged.  Lungs otherwise clear.  Heart size is
normal.  No pleural effusion.  Multiple bone infarcts noted.
IMPRESSION: No acute finding.  Unchanged right upper lobe scar.

## 2011-05-13 NOTE — Patient Instructions (Signed)
Sickle Cell: Make sure that you get that appointment with Heme/onc Do what you need to to decrease stress!!! - exercise, meetings with norma, healthy diet,    Weight loss: Return to see me in 1 month for recheck of weight and to discuss diet in more detail.

## 2011-05-13 NOTE — Progress Notes (Signed)
  Subjective:    Patient ID: [name], female    DOB: 18-Jan-1991, 20 y.o.   MRN: [id number]  HPI Patient here for hospital followup:  Sickle cell: Patient states that sickle cell pain in right shoulder and arm now resolved. No pain at all at this time. No fever. No cough. No chest pain. No shortness of breath.  Weight loss: Patient states that she has started a new exercise program. A cousin of her boyfriend as a Psychologist, educational and is helping her with her workout routine. She is doing this with her boyfriend. Is also trying to eat healthier diet. Would like to focus on weight loss at future appointments.  Increased stress: Patient feels that sickle cell crisis occur more often when she is stressed. She states she is looking for new work since she is not happy at with her employment at [employer]. Patient was planning on meeting with normal to talk about life skills and stress reduction. Has not yet schedule this appointment. Patient states that exercise seems to be helping her stress.    Review of Systems As per above.    Objective:   Physical Exam  Constitutional: She is oriented to person, place, and time. She appears well-developed and well-nourished.  HENT:  Head: Normocephalic and atraumatic.  Eyes: Pupils are equal, round, and reactive to light. Right eye exhibits no discharge. Left eye exhibits no discharge.  Neck: Normal range of motion. No thyromegaly present.  Cardiovascular: Normal rate, regular rhythm and normal heart sounds.   No murmur heard. Pulmonary/Chest: Effort normal. No respiratory distress. She has no wheezes. She has no rales.  Abdominal: Soft. Bowel sounds are normal. She exhibits no distension.  Musculoskeletal: She exhibits no edema.  Neurological: She is alert and oriented to person, place, and time.  Skin: No rash noted.  Psychiatric: She has a normal mood and affect. Her behavior is normal. Judgment and thought content normal.          Assessment  & Plan:

## 2011-05-18 ENCOUNTER — Telehealth: Payer: Self-pay | Admitting: Family Medicine

## 2011-05-18 NOTE — Telephone Encounter (Signed)
Needs all of her documents from her hospital visits for October to December for school.  Please call her with questions and when they are ready.  Note, the new number.  She also needs something on letterhead about her disease and how it effects her.

## 2011-05-18 NOTE — Telephone Encounter (Signed)
Fwd. To Dr.Caviness for letter. Thanks, .Arlyss Repress

## 2011-05-19 ENCOUNTER — Encounter (HOSPITAL_COMMUNITY): Payer: Self-pay | Admitting: Emergency Medicine

## 2011-05-19 ENCOUNTER — Emergency Department (HOSPITAL_COMMUNITY)
Admission: EM | Admit: 2011-05-19 | Discharge: 2011-05-19 | Disposition: A | Discharge status: Home / Self Care | Payer: Medicaid Other | Attending: Emergency Medicine | Admitting: Emergency Medicine

## 2011-05-19 ENCOUNTER — Encounter: Payer: Self-pay | Admitting: Family Medicine

## 2011-05-19 ENCOUNTER — Emergency Department (HOSPITAL_COMMUNITY): Payer: Medicaid Other

## 2011-05-19 DIAGNOSIS — R197 Diarrhea, unspecified: Secondary | ICD-10-CM | POA: Insufficient documentation

## 2011-05-19 DIAGNOSIS — J45909 Unspecified asthma, uncomplicated: Secondary | ICD-10-CM | POA: Insufficient documentation

## 2011-05-19 DIAGNOSIS — R5381 Other malaise: Secondary | ICD-10-CM | POA: Insufficient documentation

## 2011-05-19 DIAGNOSIS — K219 Gastro-esophageal reflux disease without esophagitis: Secondary | ICD-10-CM | POA: Insufficient documentation

## 2011-05-19 DIAGNOSIS — R110 Nausea: Secondary | ICD-10-CM | POA: Insufficient documentation

## 2011-05-19 DIAGNOSIS — F17200 Nicotine dependence, unspecified, uncomplicated: Secondary | ICD-10-CM | POA: Insufficient documentation

## 2011-05-19 DIAGNOSIS — D571 Sickle-cell disease without crisis: Secondary | ICD-10-CM | POA: Insufficient documentation

## 2011-05-19 LAB — URINALYSIS, ROUTINE W REFLEX MICROSCOPIC
Ketones, ur: NEGATIVE mg/dL
Leukocytes, UA: NEGATIVE
Nitrite: NEGATIVE
Protein, ur: NEGATIVE mg/dL
pH: 6 (ref 5.0–8.0)

## 2011-05-19 LAB — CBC
MCH: 30.5 pg (ref 26.0–34.0)
MCV: 85.3 fL (ref 78.0–100.0)
Platelets: 490 10*3/uL — ABNORMAL HIGH (ref 150–400)
RDW: 17.6 % — ABNORMAL HIGH (ref 11.5–15.5)

## 2011-05-19 LAB — POCT I-STAT, CHEM 8
Calcium, Ion: 1.17 mmol/L (ref 1.12–1.32)
HCT: 27 % — ABNORMAL LOW (ref 36.0–46.0)
TCO2: 23 mmol/L (ref 0–100)

## 2011-05-19 LAB — URINE MICROSCOPIC-ADD ON

## 2011-05-19 LAB — POCT PREGNANCY, URINE: Preg Test, Ur: NEGATIVE

## 2011-05-19 MED ORDER — HYDROXYUREA 500 MG PO CAPS: 500.0000 mg | ORAL_CAPSULE | Freq: Every day | ORAL | Status: DC

## 2011-05-19 MED ORDER — PROMETHAZINE HCL 25 MG/ML IJ SOLN
25.0000 mg | Freq: Once | INTRAMUSCULAR | Status: AC
  Administered 2011-05-19: 25 mg via INTRAVENOUS
  Filled 2011-05-19: qty 1

## 2011-05-19 MED ORDER — HYDROMORPHONE HCL PF 1 MG/ML IJ SOLN
1.0000 mg | Freq: Once | INTRAMUSCULAR | Status: AC
  Administered 2011-05-19: 1 mg via INTRAVENOUS
  Filled 2011-05-19: qty 1

## 2011-05-19 MED ORDER — SODIUM CHLORIDE 0.9 % IV BOLUS (SEPSIS)
1000.0000 mL | Freq: Once | INTRAVENOUS | Status: AC
  Administered 2011-05-19: 1000 mL via INTRAVENOUS

## 2011-05-19 NOTE — Telephone Encounter (Signed)
Letter written. I have asked Antionette Fairy to look up in the computer and list dates of admission and discharge as well as office visits and attach to the letter that I have drafted for pt.

## 2011-05-19 NOTE — ED Notes (Signed)
Pt requested food and drink.  Gave pt both, per MD.

## 2011-05-19 NOTE — ED Notes (Signed)
Patient with nausea and dizziness, vaginal bleeding.  She states she has had some diarrhea associated.  Patient states she had passed one clot earlier today.  Patient did have miscarriage in October.  She states she has irregular periods and comes and goes, bleeding can be heavy or light.  At this time it is light in nature.

## 2011-05-19 NOTE — ED Notes (Signed)
NT at bedside completing orthostatic vitals

## 2011-05-19 NOTE — ED Notes (Signed)
Attempted IV start x2. Unable to get due to poor veins. IV team notified and stated they will come.

## 2011-05-19 NOTE — ED Notes (Signed)
Pt going to Radiology at this time. Will get orthostatic vitals when she comes back.

## 2011-05-19 NOTE — ED Provider Notes (Signed)
History     CSN: [id number]  Arrival date & time 05/19/11  0108   First MD Initiated Contact with Patient 05/19/11 0241      Chief Complaint  Patient presents with  . Nausea  . Diarrhea  . Weakness    (Consider location/radiation/quality/duration/timing/severity/associated sxs/prior treatment) Patient is a 21 y.o. female presenting with diarrhea and weakness. The history is provided by the patient.  Diarrhea The primary symptoms include fatigue and diarrhea. Primary symptoms do not include fever, abdominal pain, dysuria or rash. The illness began 3 to 5 days ago. The problem has not changed since onset. The illness is also significant for itching. The illness does not include chills, bloating, tenesmus or back pain. Associated medical issues comments: Sickle cell anemia.  Weakness Primary symptoms do not include headaches or fever.  Additional symptoms include weakness. Additional symptoms do not include neck stiffness. Associated medical issues comments: Sickle cell anemia.   patient presents with multiple complaints. She is mostly concerned because she believes that she is anemic with some generalized weakness. She she had stopped her hydroxyurea a few months ago because she got pregnant. She had a miscarriage 3 months ago and has not restarted those medications. She is requesting a prescription for the same. She also has a history of transfusions and believes she may need another one. Her LMP started a few days ago and this is her first menses since her miscarriage. No abdominal pain. No fevers. No dysuria or back pain. No known sick contacts. No chest pain or shortness of breath. No recent cough. Multiple other in severity. No dizziness or near syncope. No blood in stools and no black or tarry stools.  Past Medical History  Diagnosis Date  . H/O: 1 miscarriage 03/22/2011  . 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 Active smoker 08/09/2010  . Depression 01/06/2011  . GERD (gastroesophageal reflux  disease) 02/17/2011  . Trichotillomania     h/o  . Blood transfusion   . Sickle cell anemia with crisis   . Sickle cell anemia   . SICKLE CELL ANEMIA 01/08/2009  . Sickle cell anemia     pf has frequent sickle cell crisis  . Sickle cell anemia   . Sickle cell crisis 04/27/11  . Sickle-cell disease with vaso-occlusive pain   . Asthma     Past Surgical History  Procedure Date  . Cholecystectomy 05/2010  . Dilation and curettage of uterus 02/20/11    History reviewed. No pertinent family history.  History  Substance Use Topics  . Smoking status: Former Games developer  . Smokeless tobacco: Never Used  . Alcohol Use: No    OB History    Grav Para Term Preterm Abortions TAB SAB Ect Mult Living   1               Review of Systems  Constitutional: Positive for fatigue. Negative for fever and chills.  HENT: Negative for neck pain and neck stiffness.   Eyes: Negative for pain.  Respiratory: Negative for shortness of breath.   Cardiovascular: Negative for chest pain and leg swelling.  Gastrointestinal: Positive for diarrhea. Negative for abdominal pain and bloating.  Genitourinary: Negative for dysuria, flank pain, vaginal discharge and vaginal pain.  Musculoskeletal: Negative for back pain.  Skin: Positive for itching. Negative for rash.  Neurological: Positive for weakness. Negative for headaches.  All other systems reviewed and are negative.    Allergies  Toradol  Home Medications   Current Outpatient Rx  Name Route Sig Dispense  Refill  . BUPROPION HCL ER (XL) 150 MG PO TB24 Oral Take 1 tablet (150 mg total) by mouth daily. 30 tablet 1  . CYCLOBENZAPRINE HCL 10 MG PO TABS Oral Take 10 mg by mouth 3 (three) times daily as needed. For muscle spasms     . MEDROXYPROGESTERONE ACETATE 104 MG/0.65ML [place] SUSP Subcutaneous Inject 104 mg into the skin every 3 (three) months.      . OXYCODONE-ACETAMINOPHEN 5-325 MG PO TABS Oral Take 1-2 tablets by mouth every 6 (six) hours as needed for  pain. 60 tablet 0  . TRAMADOL HCL 50 MG PO TABS Oral Take 100 mg by mouth every 6 (six) hours as needed. pain     . PRENATAL RX 60-1 MG PO TABS Oral Take 1 tablet by mouth daily. 90 tablet 1    BP 90/50  Pulse 118  Temp(Src) 98.6 F (37 C) (Oral)  Resp 20  SpO2 99%  LMP 05/13/2011  Physical Exam  Constitutional: She is oriented to person, place, and time. She appears well-developed and well-nourished.  HENT:  Head: Normocephalic and atraumatic.  Eyes: Conjunctivae and EOM are normal. Pupils are equal, round, and reactive to light.  Neck: Trachea normal. Neck supple. No thyromegaly present.  Cardiovascular: Normal rate, regular rhythm, S1 normal, S2 normal and normal pulses.     No systolic murmur is present   No diastolic murmur is present  Pulses:      Radial pulses are 2+ on the right side, and 2+ on the left side.  Pulmonary/Chest: Effort normal and breath sounds normal. She has no wheezes. She has no rhonchi. She has no rales. She exhibits no tenderness.  Abdominal: Soft. Normal appearance and bowel sounds are normal. There is no tenderness. There is no CVA tenderness and negative Murphy's sign.  Musculoskeletal:       BLE:s Calves nontender, no cords or erythema, negative Homans sign  Neurological: She is alert and oriented to person, place, and time. She has normal strength. No cranial nerve deficit or sensory deficit. GCS eye subscore is 4. GCS verbal subscore is 5. GCS motor subscore is 6.  Skin: Skin is warm and dry. No rash noted. She is not diaphoretic.  Psychiatric: Her speech is normal.       Cooperative and appropriate    ED Course  Procedures (including critical care time)  Results for orders placed during the hospital encounter of 05/19/11  URINALYSIS, ROUTINE W REFLEX MICROSCOPIC      Component Value Range   Color, Urine YELLOW  YELLOW    APPearance CLEAR  CLEAR    Specific Gravity, Urine 1.013  1.005 - 1.030    pH 6.0  5.0 - 8.0    Glucose, UA NEGATIVE   NEGATIVE (mg/dL)   Hgb urine dipstick SMALL (*) NEGATIVE    Bilirubin Urine NEGATIVE  NEGATIVE    Ketones, ur NEGATIVE  NEGATIVE (mg/dL)   Protein, ur NEGATIVE  NEGATIVE (mg/dL)   Urobilinogen, UA 1.0  0.0 - 1.0 (mg/dL)   Nitrite NEGATIVE  NEGATIVE    Leukocytes, UA NEGATIVE  NEGATIVE   URINE MICROSCOPIC-ADD ON      Component Value Range   Squamous Epithelial / LPF RARE  RARE    WBC, UA 3-6  <3 (WBC/hpf)   RBC / HPF 3-6  <3 (RBC/hpf)   Bacteria, UA RARE  RARE    Urine-Other MUCOUS PRESENT    CBC      Component Value Range   WBC  18.2 (*) 4.0 - 10.5 (K/uL)   RBC 2.66 (*) 3.87 - 5.11 (MIL/uL)   Hemoglobin 8.1 (*) 12.0 - 15.0 (g/dL)   HCT 16.1 (*) 09.6 - 46.0 (%)   MCV 85.3  78.0 - 100.0 (fL)   MCH 30.5  26.0 - 34.0 (pg)   MCHC 35.7  30.0 - 36.0 (g/dL)   RDW 04.5 (*) 40.9 - 15.5 (%)   Platelets 490 (*) 150 - 400 (K/uL)  POCT I-STAT, CHEM 8      Component Value Range   Sodium 143  135 - 145 (mEq/L)   Potassium 3.4 (*) 3.5 - 5.1 (mEq/L)   Chloride 106  96 - 112 (mEq/L)   BUN 5 (*) 6 - 23 (mg/dL)   Creatinine, Ser 8.11  0.50 - 1.10 (mg/dL)   Glucose, Bld 94  70 - 99 (mg/dL)   Calcium, Ion 9.14  7.82 - 1.32 (mmol/L)   TCO2 23  0 - 100 (mmol/L)   Hemoglobin 9.2 (*) 12.0 - 15.0 (g/dL)   HCT 95.6 (*) 21.3 - 46.0 (%)  POCT PREGNANCY, URINE      Component Value Range   Preg Test, Ur NEGATIVE     Dg Chest 2 View  05/19/2011  *RADIOLOGY REPORT*  Clinical Data: Weakness, dry cough, right flank pain, frequent urination  CHEST - 2 VIEW  Comparison: 05/06/2011  Findings: The heart size and pulmonary vascularity are normal. The lungs appear clear and expanded without focal air space disease or consolidation. No blunting of the costophrenic angles.  Vertebral bone changes consistent with sickle cell disease.  Surgical clips in the right upper quadrant.  No significant change since previous study.  IMPRESSION: The no evidence of active pulmonary disease.  Vertebral changes consistent with sickle  cell disease.  Original Report Authenticated By: Marlon Pel, M.D.   X-ray Chest Pa And Lateral   05/06/2011  *RADIOLOGY REPORT*  Clinical Data: Sickle cell disease, right shoulder and back pain.  CHEST - 2 VIEW  Comparison: 05/04/2011  Findings: No interval change.  Heart size upper normal limits to mildly enlarged.  No focal consolidation.  No pleural effusion or pneumothorax.  Surgical clips right upper quadrant.  Sclerotic changes of the humeral heads and multiple vertebral body endplate irregularities, in keeping with known sickle cell disease.  IMPRESSION: Heart size upper normal limits to mildly enlarged.  No focal consolidation.  Osseous changes of sickle cell disease involving the humeral heads and vertebral bodies.  Original Report Authenticated By: Waneta Martins, M.D.   Dg Chest 2 View  05/04/2011  *RADIOLOGY REPORT*  Clinical Data: Cough, back pain, chest pain, sickle cell disease  CHEST - 2 VIEW  Comparison: 04/19/2004  Findings: Enlargement of cardiac silhouette. Mediastinal contours and pulmonary vascularity normal. Slight volume loss in the right upper lobe with superior retraction of the minor fissure, stable. No acute infiltrate, pleural effusion or pneumothorax. Chronic irregularity of vertebral endplates thoracic spine compatible with sickle cell disease.  IMPRESSION: Enlargement of cardiac silhouette compatible with sickle cell disease. No acute abnormalities.  Original Report Authenticated By: Lollie Marrow, M.D.   Dg Chest 2 View  04/20/2011  *RADIOLOGY REPORT*  Clinical Data: Chest and upper back pain.  Sickle cell crisis.  CHEST - 2 VIEW  Comparison: 04/17/2011 and 05/16/2010.  Findings: Trachea is midline.  Heart is mildly enlarged, stable. There is linear scarring in the right perihilar region.  Lungs are otherwise clear.  No pleural fluid.  IMPRESSION: No acute findings.  Original Report Authenticated By: Reyes Ivan, M.D.   Dg Shoulder Right  05/06/2011   *RADIOLOGY REPORT*  Clinical Data: Right shoulder pain  RIGHT SHOULDER - 2+ VIEW  Comparison: None.  Findings: No evidence for fracture.  No shoulder separation or dislocation.  No changes in the proximal humerus to suggest bone infarct.  IMPRESSION: Normal exam.  Original Report Authenticated By: ERIC A. MANSELL, M.D.    IVFs. Labs and CXR.    MDM   Mild anemia. No orthostatic hypotension does not get dizzy with standing. Patient is comfortable emergency department until she followup with her primary care physician this week in clinic for further evaluation. Leukocytosis noted but no infectious complaints, no UTI, no pneumonia on x-ray. Stable for discharge home at this time.       Sunnie Nielsen, MD 05/19/11 [phone]

## 2011-05-19 NOTE — ED Notes (Signed)
Received report from [facility]. Pt resting in bed with no respiratory or cardiac distress. Introduced self. Bed rail up and stretcher locked and in lowest position.

## 2011-05-20 NOTE — Telephone Encounter (Signed)
List of Admissions and Office Visit attached, lvm for patient to come and pick up.

## 2011-05-24 DIAGNOSIS — E663 Overweight: Secondary | ICD-10-CM | POA: Insufficient documentation

## 2011-05-24 NOTE — Assessment & Plan Note (Addendum)
Pt to call and make an appointment to meet with Nelva Bush, CSW-- would like pt to discuss the barriers that she is facing right now.

## 2011-05-24 NOTE — Assessment & Plan Note (Signed)
Pt has not make heme/onc appt.  States she will call today to try to get on hematologist scheduld.  With hematology assistance would like to better manage pt's chronic sickle cell pain.

## 2011-05-24 NOTE — Assessment & Plan Note (Addendum)
Pt states she is ready to work on weight loss. Pt to do home exercise 1-2 x daily if possible.  Pt to return in 1-2 months for weight check.

## 2011-05-25 ENCOUNTER — Encounter (HOSPITAL_COMMUNITY): Payer: Self-pay | Admitting: *Deleted

## 2011-05-25 ENCOUNTER — Emergency Department (HOSPITAL_COMMUNITY)
Admission: EM | Admit: 2011-05-25 | Discharge: 2011-05-26 | Discharge status: Against Medical Advice | Payer: Medicaid Other | Attending: Emergency Medicine | Admitting: Emergency Medicine

## 2011-05-25 DIAGNOSIS — D5700 Hb-SS disease with crisis, unspecified: Secondary | ICD-10-CM | POA: Insufficient documentation

## 2011-05-25 NOTE — ED Notes (Signed)
Pt reports left arm and rt leg pain that began approx 0800 this a.m. - pt w/ hx of sickle cell - pt states pain has gotten some better however still not getting any relief.

## 2011-05-26 ENCOUNTER — Ambulatory Visit: Payer: Medicaid Other | Admitting: Family Medicine

## 2011-05-26 ENCOUNTER — Observation Stay (HOSPITAL_COMMUNITY)
Admission: EM | Admit: 2011-05-26 | Discharge: 2011-05-26 | Disposition: A | Discharge status: Home / Self Care | Payer: Medicaid Other | Source: Ambulatory Visit | Attending: Emergency Medicine | Admitting: Emergency Medicine

## 2011-05-26 ENCOUNTER — Encounter (HOSPITAL_COMMUNITY): Payer: Self-pay | Admitting: *Deleted

## 2011-05-26 DIAGNOSIS — D5700 Hb-SS disease with crisis, unspecified: Principal | ICD-10-CM | POA: Insufficient documentation

## 2011-05-26 DIAGNOSIS — F17200 Nicotine dependence, unspecified, uncomplicated: Secondary | ICD-10-CM | POA: Insufficient documentation

## 2011-05-26 DIAGNOSIS — J45909 Unspecified asthma, uncomplicated: Secondary | ICD-10-CM | POA: Insufficient documentation

## 2011-05-26 LAB — DIFFERENTIAL
Basophils Absolute: 0.1 10*3/uL (ref 0.0–0.1)
Eosinophils Absolute: 0.6 10*3/uL (ref 0.0–0.7)
Eosinophils Relative: 5 % (ref 0–5)
Lymphs Abs: 3.8 10*3/uL (ref 0.7–4.0)
Monocytes Absolute: 1.3 10*3/uL — ABNORMAL HIGH (ref 0.1–1.0)

## 2011-05-26 LAB — BASIC METABOLIC PANEL
BUN: 7 mg/dL (ref 6–23)
CO2: 20 mEq/L (ref 19–32)
GFR calc non Af Amer: >90 mL/min (ref >90)
Glucose, Bld: 84 mg/dL (ref 70–99)
Potassium: 3.3 mEq/L — ABNORMAL LOW (ref 3.5–5.1)

## 2011-05-26 LAB — URINALYSIS, ROUTINE W REFLEX MICROSCOPIC
Bilirubin Urine: NEGATIVE
Glucose, UA: NEGATIVE mg/dL
Hgb urine dipstick: NEGATIVE
Specific Gravity, Urine: 1.012 (ref 1.005–1.030)
Urobilinogen, UA: 0.2 mg/dL (ref 0.0–1.0)
pH: 5.5 (ref 5.0–8.0)

## 2011-05-26 LAB — PREGNANCY, URINE: Preg Test, Ur: NEGATIVE

## 2011-05-26 LAB — CBC
HCT: 24 % — ABNORMAL LOW (ref 36.0–46.0)
MCH: 30.2 pg (ref 26.0–34.0)
MCV: 85.4 fL (ref 78.0–100.0)
Platelets: 559 10*3/uL — ABNORMAL HIGH (ref 150–400)
RDW: 18.4 % — ABNORMAL HIGH (ref 11.5–15.5)

## 2011-05-26 LAB — RETICULOCYTES: Retic Ct Pct: 16.9 % — ABNORMAL HIGH (ref 0.4–3.1)

## 2011-05-26 MED ORDER — DIPHENHYDRAMINE HCL 50 MG/ML IJ SOLN
25.0000 mg | Freq: Once | INTRAMUSCULAR | Status: AC
  Administered 2011-05-26: 25 mg via INTRAVENOUS
  Filled 2011-05-26: qty 1

## 2011-05-26 MED ORDER — DIPHENHYDRAMINE HCL 50 MG/ML IJ SOLN
INTRAMUSCULAR | Status: AC
  Administered 2011-05-26: 25 mg via INTRAVENOUS
  Filled 2011-05-26: qty 1

## 2011-05-26 MED ORDER — HYDROMORPHONE HCL PF 1 MG/ML IJ SOLN
1.0000 mg | Freq: Once | INTRAMUSCULAR | Status: AC
  Administered 2011-05-26: 1 mg via INTRAVENOUS
  Filled 2011-05-26: qty 1

## 2011-05-26 MED ORDER — ONDANSETRON HCL 4 MG/2ML IJ SOLN
4.0000 mg | Freq: Four times a day (QID) | INTRAMUSCULAR | Status: DC | PRN
  Administered 2011-05-26: 4 mg via INTRAVENOUS
  Filled 2011-05-26: qty 2

## 2011-05-26 MED ORDER — HYDROMORPHONE HCL PF 1 MG/ML IJ SOLN
1.0000 mg | INTRAMUSCULAR | Status: DC | PRN
  Administered 2011-05-26 (×4): 1 mg via INTRAVENOUS
  Filled 2011-05-26 (×4): qty 1

## 2011-05-26 MED ORDER — SODIUM CHLORIDE 0.9 % IV SOLN: Freq: Once | INTRAVENOUS | Status: DC

## 2011-05-26 MED ORDER — OXYCODONE-ACETAMINOPHEN 5-325 MG PO TABS: 2.0000 | ORAL_TABLET | ORAL | Status: DC | PRN

## 2011-05-26 NOTE — ED Provider Notes (Signed)
Medical screening examination/treatment/procedure(s) were performed by non-physician practitioner and as supervising physician I was immediately available for consultation/collaboration.  Jazzalyn Loewenstein R. Suheyla Mortellaro, MD 05/26/11 1404 

## 2011-05-26 NOTE — ED Provider Notes (Signed)
Patient continues with pain to right knee and arm - states is just like usual pain - will give another dose of pain medication - still on sickle cell protocol - is able to eat and drink without problems, will continue to monitor.  [name] [place], [place] 05/26/11 0800  Continues with pain behind right knee - no other pain - reports that she thinks she will be able to go home - no nausea at this time - is able to eat breakfast.  [name]. [place], [place] 05/26/11 6213  Patient reports that she feels her pain is better under control - she would like to try to go home - she reports that she has no more pain medication and would like a prescription for the oxycodone - she is requesting 60 pills - I have discussed with her that we will give her 30 and she should follow up with Dr. Edmonia James with [facility] for refills as this will make sure she is improving - she is in agreement with this plan.  [name] [place], [place] 05/26/11 1046

## 2011-05-26 NOTE — ED Provider Notes (Signed)
History     CSN: [id number]  Arrival date & time 05/26/11  0016   First MD Initiated Contact with Patient 05/26/11 0155      Chief Complaint  Patient presents with  . Sickle Cell Pain Crisis    (Consider location/radiation/quality/duration/timing/severity/associated sxs/prior treatment) HPI This is a 21 year old black female with a history of sickle cell disease. She complains of atypical sickle cell pain attack that began yesterday morning. The pain is located in her left forearm and right knee. The pain is moderate to severe. It has not been controlled by her home medications. She denies fever but has had chills. She is having no nausea, vomiting, diarrhea, chest pain or dyspnea. She does have some chest wall pain in the right lower rib area when she moves. An IV fluid bolus was started by nursing staff prior to my evaluating.  Past Medical History  Diagnosis Date  . H/O: 1 miscarriage 03/22/2011  . 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 Active smoker 08/09/2010  . Depression 01/06/2011  . GERD (gastroesophageal reflux disease) 02/17/2011  . Trichotillomania     h/o  . Blood transfusion   . Sickle cell anemia with crisis     pf has frequent sickle cell crisis  . Asthma     Past Surgical History  Procedure Date  . Cholecystectomy 05/2010  . Dilation and curettage of uterus 02/20/11    History reviewed. No pertinent family history.  History  Substance Use Topics  . Smoking status: Former Games developer  . Smokeless tobacco: Never Used  . Alcohol Use: No    OB History    Grav Para Term Preterm Abortions TAB SAB Ect Mult Living   1               Review of Systems  All other systems reviewed and are negative.    Allergies  Toradol  Home Medications   Current Outpatient Rx  Name Route Sig Dispense Refill  . HYDROCODONE-ACETAMINOPHEN 5-325 MG PO TABS Oral Take 2 tablets by mouth every 6 (six) hours as needed. For pain    . HYDROXYUREA 500 MG PO CAPS Oral Take 1 capsule (500 mg  total) by mouth daily. May take with food to minimize GI side effects. 14 capsule 0  . MEDROXYPROGESTERONE ACETATE 104 MG/0.65ML [place] SUSP Subcutaneous Inject 104 mg into the skin every 3 (three) months.      . OXYCODONE-ACETAMINOPHEN 5-325 MG PO TABS Oral Take 1-2 tablets by mouth every 6 (six) hours as needed for pain. 60 tablet 0  . PRENATAL RX 60-1 MG PO TABS Oral Take 1 tablet by mouth daily. 90 tablet 1  . TRAMADOL HCL 50 MG PO TABS Oral Take 100 mg by mouth every 6 (six) hours as needed. pain       BP 103/79  Pulse 86  Temp(Src) 98.9 F (37.2 C) (Oral)  Resp 16  SpO2 100%  LMP 05/05/2011  Physical Exam General: Well-developed, well-nourished female in no acute distress; appearance consistent with age of record HENT: normocephalic, atraumatic Eyes: pupils equal round and reactive to light; extraocular muscles intact Neck: supple Heart: regular rate and rhythm Lungs: clear to auscultation bilaterally Abdomen: soft; nondistended; nontender Extremities: No deformity; full range of motion Neurologic: Awake, alert and oriented; motor function intact in all extremities and symmetric; no facial droop Skin: Warm and dry     ED Course  Procedures (including critical care time)     MDM   Nursing notes and vitals signs, including pulse  oximetry, reviewed.  Summary of this visit's results, reviewed by myself:  Labs:  Results for orders placed during the hospital encounter of 05/26/11  CBC      Component Value Range   WBC 12.9 (*) 4.0 - 10.5 (K/uL)   RBC 2.81 (*) 3.87 - 5.11 (MIL/uL)   Hemoglobin 8.5 (*) 12.0 - 15.0 (g/dL)   HCT 96.0 (*) 45.4 - 46.0 (%)   MCV 85.4  78.0 - 100.0 (fL)   MCH 30.2  26.0 - 34.0 (pg)   MCHC 35.4  30.0 - 36.0 (g/dL)   RDW 09.8 (*) 11.9 - 15.5 (%)   Platelets 559 (*) 150 - 400 (K/uL)  DIFFERENTIAL      Component Value Range   Neutrophils Relative 55  43 - 77 (%)   Neutro Abs 7.1  1.7 - 7.7 (K/uL)   Lymphocytes Relative 30  12 - 46 (%)    Lymphs Abs 3.8  0.7 - 4.0 (K/uL)   Monocytes Relative 10  3 - 12 (%)   Monocytes Absolute 1.3 (*) 0.1 - 1.0 (K/uL)   Eosinophils Relative 5  0 - 5 (%)   Eosinophils Absolute 0.6  0.0 - 0.7 (K/uL)   Basophils Relative 0  0 - 1 (%)   Basophils Absolute 0.1  0.0 - 0.1 (K/uL)  RETICULOCYTES      Component Value Range   Retic Ct Pct 16.9 (*) 0.4 - 3.1 (%)   RBC. 2.81 (*) 3.87 - 5.11 (MIL/uL)   Retic Count, Manual 474.9 (*) 19.0 - 186.0 (K/uL)  BASIC METABOLIC PANEL      Component Value Range   Sodium 137  135 - 145 (mEq/L)   Potassium 3.3 (*) 3.5 - 5.1 (mEq/L)   Chloride 106  96 - 112 (mEq/L)   CO2 20  19 - 32 (mEq/L)   Glucose, Bld 84  70 - 99 (mg/dL)   BUN 7  6 - 23 (mg/dL)   Creatinine, Ser 1.47  0.50 - 1.10 (mg/dL)   Calcium 8.9  8.4 - 82.9 (mg/dL)   GFR calc non Af Amer >90  >90 (mL/min)   GFR calc Af Amer >90  >90 (mL/min)   Placed in CDU on sickle cell protocol.           Hanley Seamen, MD 05/26/11 [phone]

## 2011-05-26 NOTE — ED Notes (Signed)
C/o sickle cell pain crisis, onset this am, started in R knee & went to L wrist, pain is usually in arms and legs, went to [facility] and waited 4 hrs, left w/o being seen. Came here. Denies recent illness or fever. Denies other sx other than pain. Has been taking usual med regimen. Pt of dr. Edmonia James at [facility]. Last crisis 05/18/2011 (mild).

## 2011-05-27 IMAGING — CR DG CHEST 2V
2 series · 2 of 2 positions shown · non-contrast
Comparison: Most recent 06/28/2010

CLINICAL DATA: Sickle cell crisis.  No current chest complaints.

CHEST - 2 VIEW

[w chest pa]
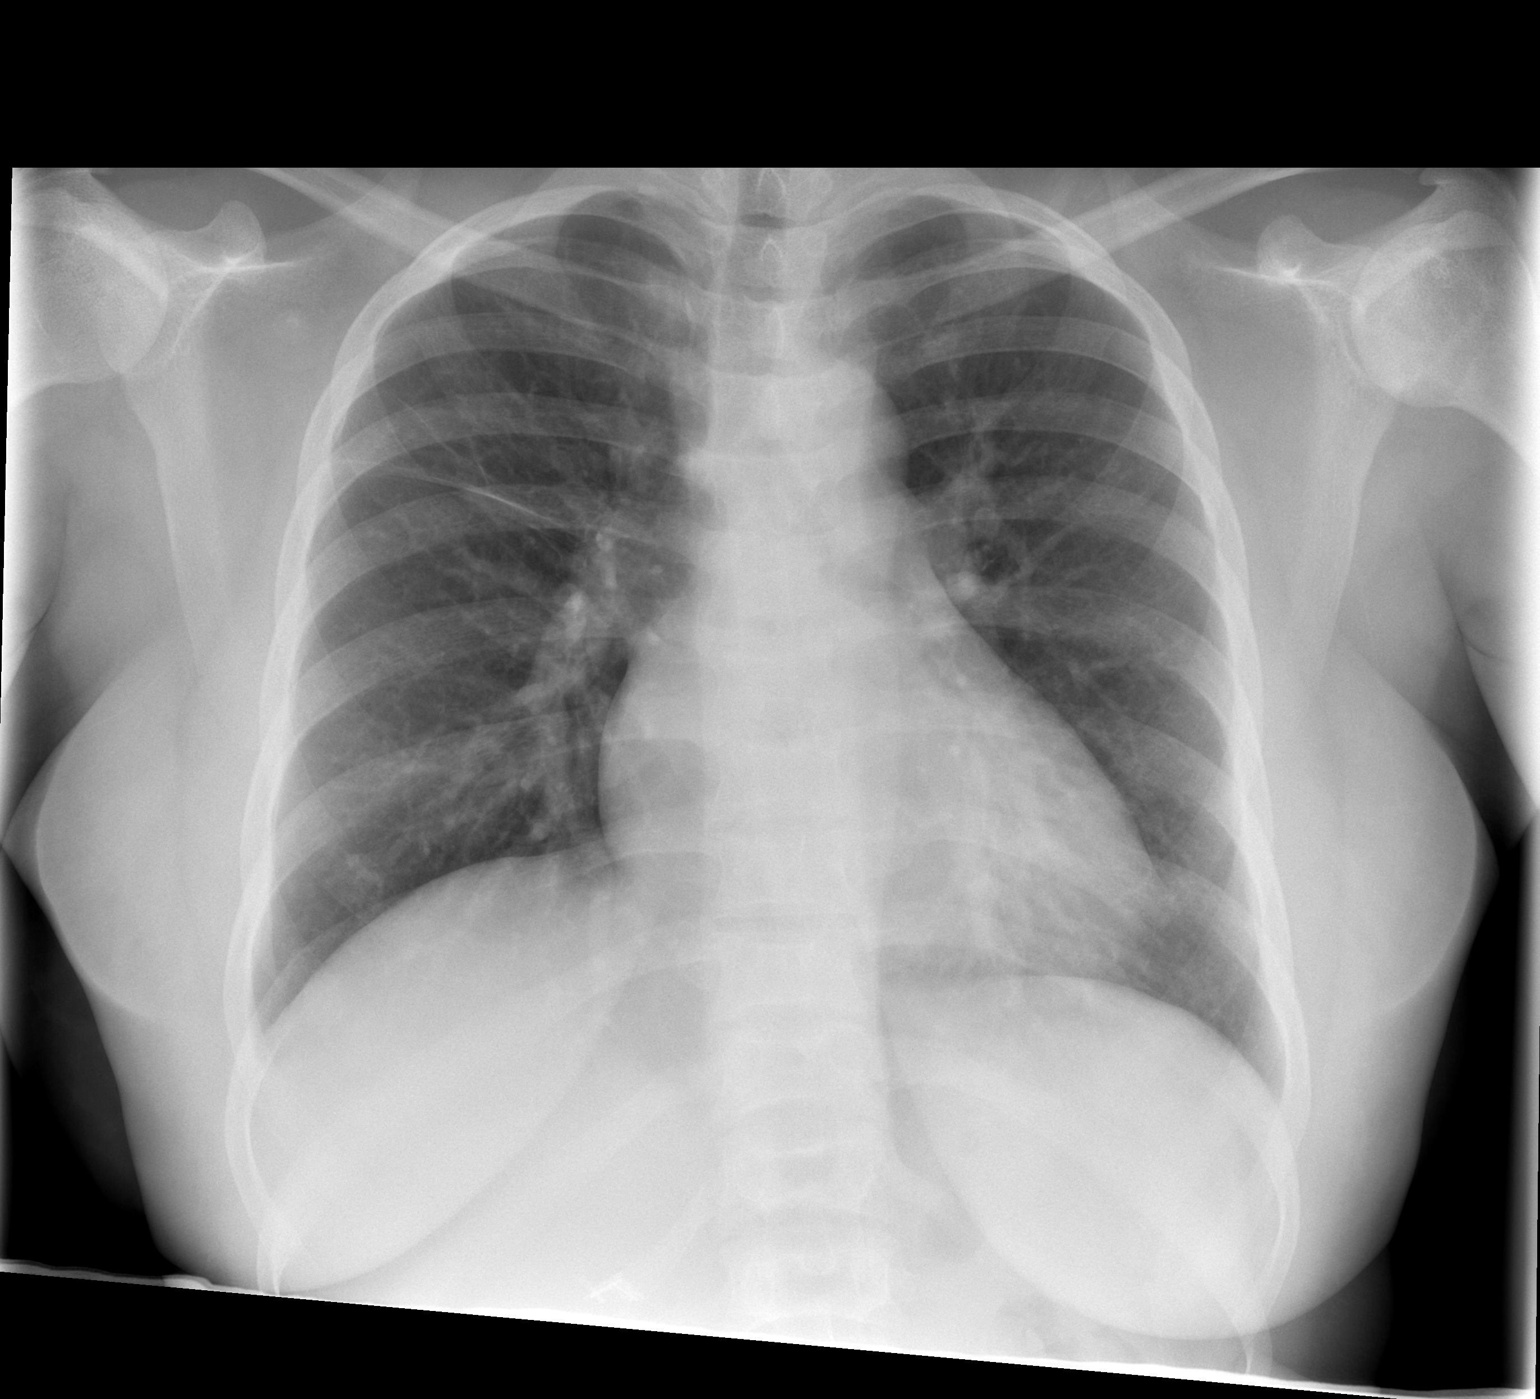

[w chest lat]
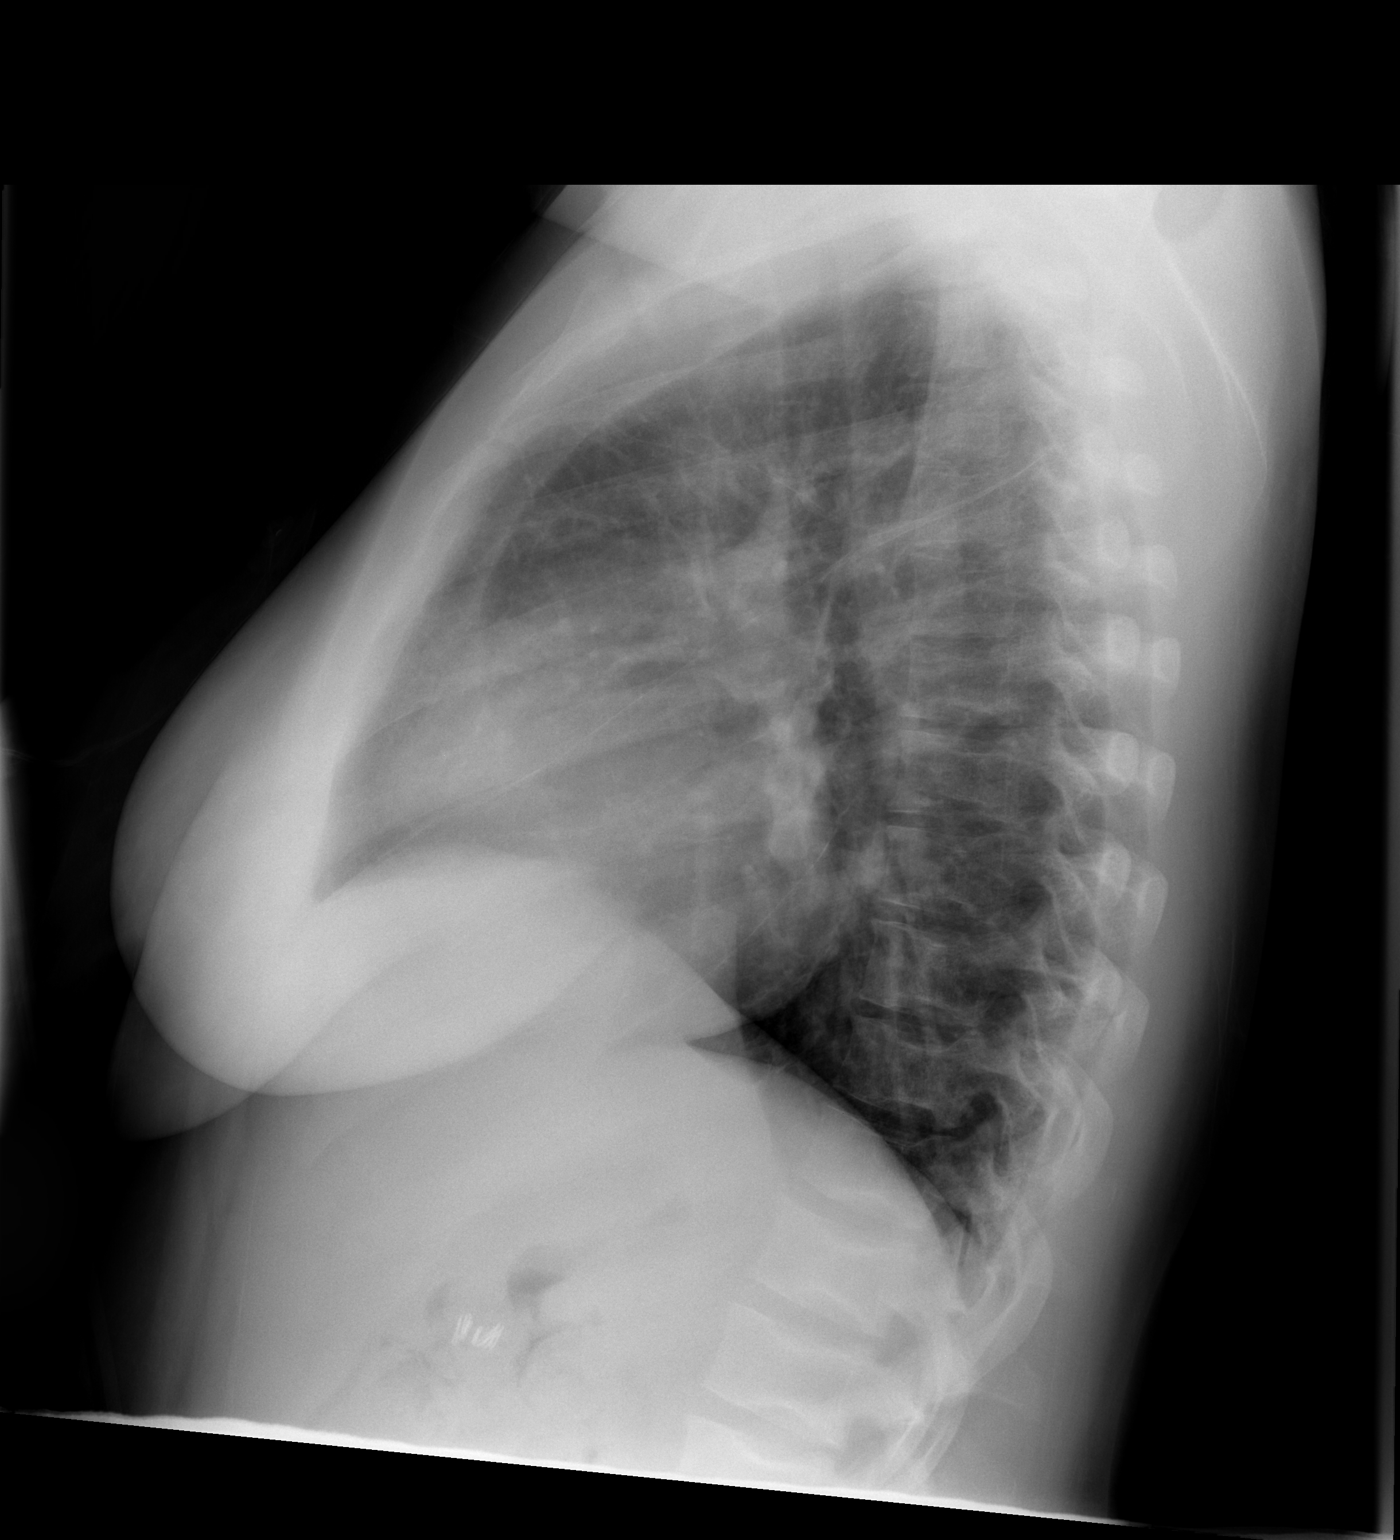

[2 of 2 positions shown; findings below may reference images not displayed]

FINDINGS: Mild right upper lobe scarring persists.  No infiltrate
or failure.  Normal heart size.  No effusion or pneumothorax.
Typical osseous changes for sickle cell disease redemonstrated.
Previous cholecystectomy.
IMPRESSION: No active cardiopulmonary disease.  No change.

## 2011-05-27 NOTE — Progress Notes (Signed)
Observation review is complete. 

## 2011-05-30 ENCOUNTER — Telehealth: Payer: Self-pay | Admitting: Family Medicine

## 2011-05-30 NOTE — Telephone Encounter (Signed)
Please put in lab order for patient to have at her visit on 1/28

## 2011-05-31 ENCOUNTER — Observation Stay (HOSPITAL_COMMUNITY)
Admission: EM | Admit: 2011-05-31 | Discharge: 2011-06-01 | Disposition: A | Discharge status: Home / Self Care | Payer: Medicaid Other | Source: Ambulatory Visit | Attending: Family Medicine | Admitting: Family Medicine

## 2011-05-31 ENCOUNTER — Encounter (HOSPITAL_COMMUNITY): Payer: Self-pay | Admitting: *Deleted

## 2011-05-31 DIAGNOSIS — D5700 Hb-SS disease with crisis, unspecified: Secondary | ICD-10-CM

## 2011-05-31 DIAGNOSIS — M62838 Other muscle spasm: Secondary | ICD-10-CM

## 2011-05-31 DIAGNOSIS — K219 Gastro-esophageal reflux disease without esophagitis: Secondary | ICD-10-CM

## 2011-05-31 DIAGNOSIS — F6389 Other impulse disorders: Secondary | ICD-10-CM | POA: Insufficient documentation

## 2011-05-31 DIAGNOSIS — F633 Trichotillomania: Secondary | ICD-10-CM | POA: Diagnosis present

## 2011-05-31 DIAGNOSIS — E049 Nontoxic goiter, unspecified: Secondary | ICD-10-CM | POA: Insufficient documentation

## 2011-05-31 DIAGNOSIS — E663 Overweight: Secondary | ICD-10-CM | POA: Insufficient documentation

## 2011-05-31 DIAGNOSIS — M62830 Muscle spasm of back: Secondary | ICD-10-CM | POA: Diagnosis present

## 2011-05-31 DIAGNOSIS — D571 Sickle-cell disease without crisis: Secondary | ICD-10-CM

## 2011-05-31 LAB — DIFFERENTIAL
Eosinophils Relative: 4 % (ref 0–5)
Lymphocytes Relative: 28 % (ref 12–46)
Lymphs Abs: 3.4 10*3/uL (ref 0.7–4.0)
Monocytes Absolute: 1.2 10*3/uL — ABNORMAL HIGH (ref 0.1–1.0)
Monocytes Relative: 10 % (ref 3–12)
Neutro Abs: 7.1 10*3/uL (ref 1.7–7.7)

## 2011-05-31 LAB — RETICULOCYTES: Retic Ct Pct: 17.4 % — ABNORMAL HIGH (ref 0.4–3.1)

## 2011-05-31 LAB — CBC
HCT: 25.6 % — ABNORMAL LOW (ref 36.0–46.0)
Hemoglobin: 8.9 g/dL — ABNORMAL LOW (ref 12.0–15.0)
MCV: 87.7 fL (ref 78.0–100.0)
RBC: 2.92 MIL/uL — ABNORMAL LOW (ref 3.87–5.11)
WBC: 12.2 10*3/uL — ABNORMAL HIGH (ref 4.0–10.5)

## 2011-05-31 LAB — BASIC METABOLIC PANEL
CO2: 21 mEq/L (ref 19–32)
Calcium: 8.8 mg/dL (ref 8.4–10.5)
Chloride: 107 mEq/L (ref 96–112)
Creatinine, Ser: 0.51 mg/dL (ref 0.50–1.10)
Glucose, Bld: 83 mg/dL (ref 70–99)

## 2011-05-31 LAB — PREGNANCY, URINE: Preg Test, Ur: NEGATIVE

## 2011-05-31 MED ORDER — HYDROMORPHONE HCL PF 1 MG/ML IJ SOLN
1.0000 mg | Freq: Once | INTRAMUSCULAR | Status: AC
  Administered 2011-05-31: 1 mg via INTRAVENOUS
  Filled 2011-05-31: qty 1

## 2011-05-31 MED ORDER — IBUPROFEN 800 MG PO TABS
800.0000 mg | ORAL_TABLET | Freq: Four times a day (QID) | ORAL | Status: DC | PRN
  Filled 2011-05-31: qty 1

## 2011-05-31 MED ORDER — HYDROMORPHONE HCL PF 1 MG/ML IJ SOLN
1.0000 mg | Freq: Once | INTRAMUSCULAR | Status: AC
  Administered 2011-05-31: 1 mg via INTRAMUSCULAR
  Filled 2011-05-31: qty 1

## 2011-05-31 MED ORDER — FOLIC ACID 1 MG PO TABS
1.0000 mg | ORAL_TABLET | Freq: Every day | ORAL | Status: DC
  Administered 2011-05-31 – 2011-06-01 (×2): 1 mg via ORAL
  Filled 2011-05-31 (×3): qty 1

## 2011-05-31 MED ORDER — DIPHENHYDRAMINE HCL 25 MG PO CAPS: 25.0000 mg | ORAL_CAPSULE | Freq: Four times a day (QID) | ORAL | Status: DC | PRN

## 2011-05-31 MED ORDER — IBUPROFEN 800 MG PO TABS
800.0000 mg | ORAL_TABLET | Freq: Once | ORAL | Status: AC
  Administered 2011-05-31: 800 mg via ORAL
  Filled 2011-05-31: qty 1

## 2011-05-31 MED ORDER — SODIUM CHLORIDE 0.9 % IV SOLN
Freq: Once | INTRAVENOUS | Status: AC
  Administered 2011-05-31: 14:00:00 via INTRAVENOUS

## 2011-05-31 MED ORDER — PRENATAL RX 60-1 MG PO TABS
1.0000 | ORAL_TABLET | Freq: Every day | ORAL | Status: DC
Start: 2011-05-31 — End: 2011-05-31

## 2011-05-31 MED ORDER — SODIUM CHLORIDE 0.9 % IV SOLN
INTRAVENOUS | Status: DC
  Administered 2011-05-31: 19:00:00 via INTRAVENOUS

## 2011-05-31 MED ORDER — PANTOPRAZOLE SODIUM 40 MG PO TBEC
40.0000 mg | DELAYED_RELEASE_TABLET | Freq: Every day | ORAL | Status: DC
  Administered 2011-05-31 – 2011-06-01 (×2): 40 mg via ORAL
  Filled 2011-05-31 (×2): qty 1

## 2011-05-31 MED ORDER — HYDROXYUREA 500 MG PO CAPS
500.0000 mg | ORAL_CAPSULE | Freq: Three times a day (TID) | ORAL | Status: DC
  Administered 2011-05-31 – 2011-06-01 (×3): 500 mg via ORAL
  Filled 2011-05-31 (×4): qty 1

## 2011-05-31 MED ORDER — DOCUSATE SODIUM 100 MG PO CAPS
100.0000 mg | ORAL_CAPSULE | Freq: Two times a day (BID) | ORAL | Status: DC
  Administered 2011-05-31 – 2011-06-01 (×2): 100 mg via ORAL
  Filled 2011-05-31 (×2): qty 1

## 2011-05-31 MED ORDER — HEPARIN SODIUM (PORCINE) 5000 UNIT/ML IJ SOLN
5000.0000 [IU] | Freq: Three times a day (TID) | INTRAMUSCULAR | Status: DC
  Administered 2011-05-31 – 2011-06-01 (×2): 5000 [IU] via SUBCUTANEOUS
  Filled 2011-05-31 (×8): qty 1

## 2011-05-31 MED ORDER — PANTOPRAZOLE SODIUM 20 MG PO TBEC: 20.0000 mg | DELAYED_RELEASE_TABLET | Freq: Every day | ORAL | Status: DC

## 2011-05-31 MED ORDER — METAXALONE 800 MG PO TABS
800.0000 mg | ORAL_TABLET | Freq: Three times a day (TID) | ORAL | Status: DC
  Administered 2011-05-31 – 2011-06-01 (×2): 800 mg via ORAL
  Filled 2011-05-31 (×5): qty 1

## 2011-05-31 MED ORDER — HYDROMORPHONE HCL PF 1 MG/ML IJ SOLN
1.0000 mg | INTRAMUSCULAR | Status: DC | PRN
  Administered 2011-05-31 – 2011-06-01 (×5): 1 mg via INTRAVENOUS
  Filled 2011-05-31 (×5): qty 1

## 2011-05-31 MED ORDER — PRENATAL MULTIVITAMIN CH
1.0000 | ORAL_TABLET | Freq: Every day | ORAL | Status: DC
  Administered 2011-05-31 – 2011-06-01 (×2): 1 via ORAL
  Filled 2011-05-31 (×2): qty 1

## 2011-05-31 MED ORDER — SODIUM CHLORIDE 0.9 % IV BOLUS (SEPSIS)
1000.0000 mL | Freq: Once | INTRAVENOUS | Status: AC
  Administered 2011-05-31: 1000 mL via INTRAVENOUS

## 2011-05-31 MED ORDER — ONDANSETRON HCL 4 MG PO TABS
8.0000 mg | ORAL_TABLET | Freq: Three times a day (TID) | ORAL | Status: DC | PRN
  Administered 2011-05-31: 8 mg via ORAL
  Filled 2011-05-31: qty 2

## 2011-05-31 MED ORDER — ACETAMINOPHEN 325 MG PO TABS: 650.0000 mg | ORAL_TABLET | Freq: Four times a day (QID) | ORAL | Status: DC | PRN

## 2011-05-31 NOTE — ED Notes (Signed)
Pt reports sickle cell pain x 1 day. Pain to right shoulder/arm/back. Pain 10/10. Pt took Percocet 5-325 mg PTA with no relief.

## 2011-05-31 NOTE — ED Provider Notes (Signed)
History     CSN: [id number]  Arrival date & time 05/31/11  1013   First MD Initiated Contact with Patient 05/31/11 1040      Chief Complaint  Patient presents with  . Sickle Cell Pain Crisis    (Consider location/radiation/quality/duration/timing/severity/associated sxs/prior treatment) HPI History provided by pt.   Pt presents w/ typical sickle cell pain in right right posterior neck, shoulder, upper arm and down right side of back.  Pain started yesterday evening and was preceded by muscle spasm in upper back.  No trauma.  Denies fever, CP, SOB, cough.  Does not have pain anywhere else.  Has taken her percocet and hydroxyurea w/out relief.  Per prior chart, most recent admission for pain crisis was 12/12-12/14.    Past Medical History  Diagnosis Date  . H/O: 1 miscarriage 03/22/2011  . 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 Active smoker 08/09/2010  . Depression 01/06/2011  . GERD (gastroesophageal reflux disease) 02/17/2011  . Trichotillomania     h/o  . Blood transfusion   . Asthma   . Sickle cell anemia with crisis     Past Surgical History  Procedure Date  . Cholecystectomy 05/2010  . Dilation and curettage of uterus 02/20/11    History reviewed. No pertinent family history.  History  Substance Use Topics  . Smoking status: Former Games developer  . Smokeless tobacco: Never Used  . Alcohol Use: No    OB History    Grav Para Term Preterm Abortions TAB SAB Ect Mult Living   1               Review of Systems  All other systems reviewed and are negative.    Allergies  Toradol  Home Medications   Current Outpatient Rx  Name Route Sig Dispense Refill  . FOLIC ACID 1 MG PO TABS Oral Take 1 mg by mouth daily.    [place] HYDROXYUREA 500 MG PO CAPS Oral Take 500 mg by mouth 3 (three) times daily. May take with food to minimize GI side effects.    . OXYCODONE-ACETAMINOPHEN 5-325 MG PO TABS Oral Take 1 tablet by mouth every 4 (four) hours as needed.    [place] PRENATAL RX 60-1 MG PO TABS  Oral Take 1 tablet by mouth daily. 90 tablet 1    BP 96/64  Pulse 85  Temp(Src) 98 F (36.7 C) (Oral)  Resp 18  SpO2 98%  LMP 05/05/2011  Physical Exam  Nursing note and vitals reviewed. Constitutional: She is oriented to person, place, and time. She appears well-developed and well-nourished. No distress.       Pt tearful and appears mildly uncomfortable  HENT:  Head: Normocephalic and atraumatic.  Eyes:       Normal appearance  Neck: Normal range of motion.  Cardiovascular: Normal rate and regular rhythm.   Pulmonary/Chest: Effort normal and breath sounds normal. No respiratory distress.  Musculoskeletal:       Mild tenderness cervical spine and paraspinals as well as right trap, entire shoulder and right upper arm. Pain w/ flexion and abduction of shoulder past approx 30deg.  Distal NV intact.    Neurological: She is alert and oriented to person, place, and time.  Skin: Skin is warm and dry. No rash noted.  Psychiatric: She has a normal mood and affect. Her behavior is normal.    ED Course  Procedures (including critical care time)  Labs Reviewed  CBC - Abnormal; Notable for the following:    WBC 12.2 (*)  RBC 2.92 (*)    Hemoglobin 8.9 (*)    HCT 25.6 (*)    RDW 17.6 (*)    Platelets 529 (*)    All other components within normal limits  DIFFERENTIAL - Abnormal; Notable for the following:    Monocytes Absolute 1.2 (*)    All other components within normal limits  RETICULOCYTES - Abnormal; Notable for the following:    Retic Ct Pct 17.4 (*)    RBC. 2.92 (*)    Retic Count, Manual 508.1 (*)    All other components within normal limits  BASIC METABOLIC PANEL   No results found.   1. Sickle cell anemia       MDM  Pt presents w/ c/o typical, non-traumatic, sickle cell pain in RUE and right back/postior neck.  No respiratory sx.  Afebrile, no respiratory distress lungs CTA, diffuse right upper body ttp on exam.  Pt tearful.  She has received 3mg  dilaudid and  1+ liters NS and pain has not started to improve.  Labs pending.    VSS.  Pt requested another dose of pain medication.  Blood was lost en route to lab.    [facility] consulted for admission for sickle cell crisis.  3:59 PM   Otilio Miu, [place] 05/31/11 1601

## 2011-05-31 NOTE — ED Provider Notes (Signed)
Medical screening examination/treatment/procedure(s) were conducted as a shared visit with non-physician practitioner(s) and myself.  I personally evaluated the patient during the encounter The patient is a 21 year old, female, with sickle cell disease.  She complains of right-sided neck pain, extending into her right shoulder, and right side of her chest since yesterday.  She denies trauma, fevers, chills, cough, or shortness of breath.  She took her medication for sickle cell disease, but it did not resolve slowly.  She presented to the emergency department.  She appears to be in moderate amount of discomfort.  Right now.  Otherwise, her physical examination is normal.  We will establish an IV and give her fluids, and analgesics for acute pain crisis.  Nicholes Stairs, MD 05/31/11 1221

## 2011-05-31 NOTE — ED Notes (Signed)
Report received, assumed care.  

## 2011-05-31 NOTE — H&P (Signed)
Family Medicine Teaching Service HISTORY & PHYSICAL   Patient name: [name] Medical record number: [id number] Date of birth: 04-Sep-1990 Age: 21 y.o. Gender: female  Primary Care Provider: [name], MD  CODE STATUS: Full Code   Chief Complaint: Sickle Cell SS Pain Crisis    HPI  [name] is a 21 y.o. year old female presenting with a Sickle Cell SS Pain crisis involving her R sided neck, shoulder and upper back.  She reports that she first noticed her pain last night prior to going to be and required 1 Percocet 5/325 and a heating pad to make her comfortable before she was able to sleep.  Her pain is severe but non-radiating.  She awoke with pain ~6AM and took 1 additional Percocet and was able to fall back asleep.  When she awoke for the day at 0800 she was in worse pain and felt that her home medications were no longer adequately treating her pain and presented to the ED.  She reports this is her typical pain-crisis-like pain although having it in her back is not a normal location for her.     ROS    Constitutional Reports that even though she has been in the hospital twice this year that she already feels that he is "getting on tract" for 2013 and that she is trying to get her life together.    Infectious No sick contacts, no fevers, no chills  Resp Mild intermittent cough since last night, non-productive, no oxygen requirement, no dyspnea  Cardiac neg  GI No n/v  GU No changes in bladder, now on birth control - boyfriend and her have decided to wait a year at this time to try to have a baby  Skin neg  MSK Back and shoulder pain as above  Trauma Negative  Nutrition Working on improving her diet, reports increasing   Activity Been working out more  Neuro No focal neurologic symptoms reported  Psych Reports mood and affect are improved and that 2013 is going to be a better year and that she is getting her life together this year.    ROS as per HPI and above  otherwise 12 point ROS negative.             HISTORY:  PMHx:  Past Medical History  Diagnosis Date  . H/O: 1 miscarriage 03/22/2011  . 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 Active smoker 08/09/2010  . Depression 01/06/2011  . GERD (gastroesophageal reflux disease) 02/17/2011  . Trichotillomania     h/o  . Blood transfusion   . Asthma   . Sickle cell anemia with crisis    PSHx: Past Surgical History  Procedure Date  . Cholecystectomy 05/2010  . Dilation and curettage of uterus 02/20/11   Social Hx: History   Social History  . Marital Status: Single    Spouse Name: N/A    Number of Children: N/A  . Years of Education: N/A   Social History Main Topics  . Smoking status: Former Games developer  . Smokeless tobacco: Never Used  . Alcohol Use: No  . Drug Use: Yes    Special: Marijuana     marijuana  . Sexually Active: Yes   Other Topics Concern  . None   Social History Narrative  . None   Family Hx: History reviewed. No pertinent family history.  Allergies: Allergies  Allergen Reactions  . Toradol Hives and Itching    Some itching, give with benedryl    Home Medications: Prior  to Admission medications   Medication Sig Start Date End Date Taking? Authorizing Provider  folic acid (FOLVITE) 1 MG tablet Take 1 mg by mouth daily.   Yes Historical Provider, MD  hydroxyurea (HYDREA) 500 MG capsule Take 500 mg by mouth 3 (three) times daily. May take with food to minimize GI side effects. 05/19/11 06/18/11 Yes Sunnie Nielsen, MD  oxyCODONE-acetaminophen (PERCOCET) 5-325 MG per tablet Take 1 tablet by mouth every 4 (four) hours as needed. 05/26/11 06/05/11 Yes Scarlette Calico C. [place], [place]  Prenatal Vit-Fe Fumarate-FA (PRENATAL MULTIVITAMIN) 60-1 MG tablet Take 1 tablet by mouth daily. 04/19/11 04/18/12 Yes Shelly Flatten, MD  medroxyPROGESTERone (DEPO-PROVERA) 150 MG/ML injection Inject 150 mg into the muscle every 3 (three) months.    Historical Provider, MD              OBJECTIVE  Vitals: Patient  Vitals for the past 24 hrs:  BP Temp Temp src Pulse Resp SpO2  05/31/11 1539 92/52 mmHg 97 F (36.1 C) Oral 88  19  100 %  05/31/11 1231 96/64 mmHg 98 F (36.7 C) Oral 85  18  98 %  05/31/11 1032 99/57 mmHg 99.1 F (37.3 C) Oral 92  18  97 %   Wt Readings from Last 3 Encounters:  05/13/11 166 lb (75.297 kg) (90.55%*)  05/06/11 166 lb 10.7 oz (75.6 kg) (90.84%*)  04/21/11 169 lb 8 oz (76.885 kg) (91.90%*)   * Growth percentiles are based on CDC 2-20 Years data.   No intake or output data in the 24 hours ending 05/31/11 1658  PE: GENERAL:  Young adult African American Female examined in [facility] ED.  In moderate discomfort; no resp distress HNEENT: H&N: supple, no adenopathy, thyroid abnormal revealing thyroid enlarged, smooth, nontender, no nodules., trachea midline and no carotid bruits OROPHARYNX: lips, mucosa, and tongue normal; teeth and gums normal; no pharyngeal erythema or exudate EYES: EOMi, no scleral icterus NOSE: normal THORAX: HEART: RRR, s1/s2 heard, no murmur LUNGS: CTA B, no crackles or wheezes ABDOMEN:  +BS, soft, non-tender, no masses, no guarding, no organomegaly EXTREMITIES: Moves all 4 extremities spontaneously, no peripheral edema, 2+/4 LE pulses >MSK/Osteopathic: Posterior R rib 5 with associated Tissue texture changes >PSYCH: Appropriate mentation and affect.  No evidence of SI or HI.  Appropriate behavior for given level of pain  LABS: Hematology:  Lab 05/31/11 1348 05/26/11 0215  WBC 12.2* 12.9*  HGB 8.9* 8.5*  HCT 25.6* 24.0*  PLT 529* 559*  RDW 17.6* 18.4*  MCV 87.7 85.4  MCHC 34.8 35.4    Lab 05/31/11 1348 05/26/11 0215  MRET 508.1* 474.9*    Metabolic:  Lab 05/31/11 1348 05/26/11 0215  NA 139 137  K 4.0 3.3*  CL 107 106  CO2 21 20  BUN 5* 7  CREATININE 0.51 0.59  GLUCOSE 83 84  CALCIUM 8.8 8.9  MG -- --  PHOS -- --   MICRO None  IMAGING: none             ASSESSMENT & PLAN  21 y.o. year old female with Sickle Cell Pain  crisis involving R shoulder, neck, and arm.  1. SS Pain Crisis:  Pt reports this is typical pain for her although this could be a muscle spasm also.  Pain control of Dilaudid IV no PCA; tylenol; skelaxin for muscle spasm involvement (does not feel that flexeril has been working for her).  At this time no signs or symptoms of complications of SS crisis such as hepatic/splenic  sequestration, acute chest, aplastic crisis.  Will continue to monitor for any clinical changes including fever, worsening or persistent cough, abdominal pain.  Will provide bowel regimen and zofran for symptomatic management.  Continue her home Hydroxyurea, Folate & MV.  Check CBC and retic in AM.  Transition to po meds ASAP.   *Dilaudid 1mg  IV q2o prn *Tylenol q 6o prn *Skelaxin 800mg  po tid *Hydroxyurea *Folate *MV [ ]  CBC + Retic  2. Psych (history of depression, trichotillomania):  Has recently stopped her antidepressant per last report in ED on 05/26/2011.  Continue to follow.    3. Thyroid Goiter:  Will check TSH in AM. [ ]  TSH  4. GYN: On Depo-Provera.  Will check U preg [ ]  U Preg   --- FEN/GI: Regular diet, Zofran, Colace --- IVFs: NS KVO  --- PPx: Heparin, Protonix            DISPOSITION  Will admit for IV pain control.  Transition to po pain control in AM.  Disposition to home with consideration of d/c to home with Oxycodone 10mg  po as OP (reportedly works better than 2X5mg  Percocet).  Gaspar Bidding, DO [facility] Resident - PGY-1 05/31/2011 4:58 PM [facility]MT[facility] Intern Page: [phone]    PGY-2 ADDENDUM I have seen and examined patient and agree with Dr. Janeece Riggers assessment and plan.  For details, please see above note.  Briefly, this is a 21 yo AAF with known [place] disease presenting with a pain crisis.  Patient states that she started having pain that felt like a muscle spasm on the right side of her neck and in her right shoulder yesterday around 3pm.  She went to work and when she got home  last night, the pain had worsened.  She took one Percocet 5/325 and went to bed.  She woke up with worsening pain around 6am and used a heating pad.  Pain continued to worsen and she was brought to the ED by her significant other.  She is tearful at times and feels like her pain is more similar to a pain crisis than muscle spasm at this point.  She is having pain in her right upper arm but denies pain on her left side or bilateral legs.  She has not had any chest pain or SOB but does note a dry cough since yesterday.   She does report positive lifestyle changes such as taking her folic acid, prenatal vitamin, and hydroxyurea as well as avoiding THC x1 month. She has been trying to eat more healthy foods and stay hydrated with water.  She is also trying to perform cardiovascular exercise such as walking more regularly.  She continues to work at [employer] and is trying to increase her work load, especially over the past 2 weeks, since her significant other is not currently working.   O:  Filed Vitals:   05/31/11 1539  BP: 92/52  Pulse: 88  Temp: 97 F (36.1 C)  Resp: 19    Gen: NAD, comfortable on RA, no increased WOB, tearful at times but easily distracted from pain HEENT: MMM, no scleral icterus, no pharyngeal erythema or exudate CV: mildly tachycardic, regular rhythm, no murmur Pulm: Clear bilaterally, no wheezes, rales, or rhonchi Musc: palpable "knot" mid scapular area, no surrounding erythema, no rashes, tenderness to palpation over right neck, shoulder, mid back, and upper arm  Labs: Hgb at baseline, 8.9, no leukocytosis  A/P: 21 y.o. AA female presenting with a semi-typical pain crisis with right  sided shoulder, upper back/scapular, mid back, and upper arm pain  1. Pain crisis: May have been exacerbated by muscle spasm vs increased activity vs other  - Hbg at baseline, continue to monitor  - Consider CXR if new O2 requirement or SOB or becomes febrile, no current signs of acute  chest  - Urine pregnancy test will be ordered  - Patient reports that Dilaudid typically works for her pain crises, will avoid PCA at this time and start with IV with hope to quickly transition to po medications tomorrow - Will also start muscle relaxant; pt does not feel like her home cyclobenzaprine works so will try skelaxin while in house to see if this is a better alternative - Consider giving oxycodone IR at discharge for pain rather than Percocet based on patient reported efficacy difference, although would consult with PCP before making this decision - Continue home hydroxyurea and folic acid  2. FEN/GI: po ad lib regular diet  3. Prophylaxis: heparin 5000units SQ TID, protonix, colace  4. Disposition: pending clinical improvement and transition to po pain medications.   MCGILL,JACQUELYN 05/31/2011 5:10 PM

## 2011-05-31 NOTE — ED Notes (Signed)
To ed for eval of right side pain increasing since yesterday. Same pain as sickle crisis.

## 2011-05-31 NOTE — ED Provider Notes (Signed)
Medical screening examination/treatment/procedure(s) were conducted as a shared visit with non-physician practitioner(s) and myself.  I personally evaluated the patient during the encounter  Morgan Keinath P Davaris Youtsey, MD 05/31/11 1633 

## 2011-06-01 LAB — BASIC METABOLIC PANEL
BUN: 6 mg/dL (ref 6–23)
Chloride: 105 mEq/L (ref 96–112)
GFR calc Af Amer: >90 mL/min (ref >90)
GFR calc non Af Amer: >90 mL/min (ref >90)
Potassium: 3.6 mEq/L (ref 3.5–5.1)
Sodium: 137 mEq/L (ref 135–145)

## 2011-06-01 LAB — TSH: TSH: 1.544 u[IU]/mL (ref 0.350–4.500)

## 2011-06-01 LAB — RETICULOCYTES
RBC.: 2.92 MIL/uL — ABNORMAL LOW (ref 3.87–5.11)
Retic Count, Absolute: 516.8 10*3/uL — ABNORMAL HIGH (ref 19.0–186.0)
Retic Ct Pct: 17.7 % — ABNORMAL HIGH (ref 0.4–3.1)

## 2011-06-01 LAB — CBC
HCT: 25.3 % — ABNORMAL LOW (ref 36.0–46.0)
Hemoglobin: 8.8 g/dL — ABNORMAL LOW (ref 12.0–15.0)
MCHC: 34.8 g/dL (ref 30.0–36.0)
WBC: 13 10*3/uL — ABNORMAL HIGH (ref 4.0–10.5)

## 2011-06-01 MED ORDER — OXYCODONE HCL 5 MG PO TABS
10.0000 mg | ORAL_TABLET | Freq: Four times a day (QID) | ORAL | Status: DC | PRN
  Administered 2011-06-01: 10 mg via ORAL
  Filled 2011-06-01: qty 2

## 2011-06-01 MED ORDER — ACETAMINOPHEN 325 MG PO TABS
650.0000 mg | ORAL_TABLET | Freq: Four times a day (QID) | ORAL | Status: DC
  Administered 2011-06-01: 650 mg via ORAL
  Filled 2011-06-01: qty 2

## 2011-06-01 MED ORDER — OXYCODONE HCL 10 MG PO TABS: 10.0000 mg | ORAL_TABLET | Freq: Four times a day (QID) | ORAL | Status: DC | PRN

## 2011-06-01 MED ORDER — METAXALONE 800 MG PO TABS: 800.0000 mg | ORAL_TABLET | Freq: Three times a day (TID) | ORAL | Status: DC | PRN

## 2011-06-01 NOTE — Discharge Summary (Signed)
Family Medicine Teaching Service DISCHARGE SUMMARY   Patient name: [name] Medical record number: [id number] Date of birth: [birth year] Age: 21 y.o. Gender: female  Attending Physician: No att. providers found Primary Care Provider: [name], MD, MD Consultants: none  Dates of Hospitalization:  05/31/2011 to 06/01/2011 Length of Stay: 1 days  Admission Diagnoses:  Acute Pain Crisis  Discharge Diagnoses:  Principal Problem:  *Sickle-cell disease with vaso-occlusive pain Active Problems:  SICKLE CELL ANEMIA  TRICHOTILLOMANIA  Muscle spasm of back  Patient Active Problem List  Diagnoses Date Noted  . Overweight 05/24/2011  . Stress 04/29/2011  . Muscle spasm of back 04/27/2011  . Sickle-cell disease with vaso-occlusive pain 03/22/2011  . Contraception management 03/01/2011  . GERD (gastroesophageal reflux disease) 02/17/2011  . Depression 01/06/2011  . Back pain 09/17/2010  . Active smoker 08/09/2010  . DRY SKIN 05/18/2009  . SICKLE CELL ANEMIA 01/08/2009  . 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 year old female who presented with 1 day history of right-sided neck, shoulder, back pain. She states it she had gone to bed the night prior with some muscle soreness and required Percocet and heating pad uncomfortable. The pain woke her up around 6 AM the day of admission was not alleviated by Percocet and he did that time. She presented to the emergency department received IV Dilaudid with no resolution of her pain she felt appropriate for admission.  She was admitted and continued on IV glottis as well as Skelaxin for muscle relaxant. She had moderate improvement in her pain going from a 10 out of 10 to 5-6/10 overnight and felt that she would be we'll tolerate by mouth medications at that time. She was transitioned to by mouth oxycodone 10 mg Q6 hours as well as Tylenol and continued on her muscle relaxant.  Her  hemoglobin was stable throughout admission as was her reticulocyte count and she had no signs or symptoms of other sickle cell crisis; including acute chest syndrome, hepatic or splenic sequestration syndrome, or aplastic crisis.  She is to be discharged home on oxycodone IR 10 mg by mouth every 6 hours for severe pain with addition of Tylenol as needed. We have changed her muscle relaxer to Skelaxin as she notes that Flexeril has not been working as well for her.  She'll also has Percocet 5/325 for her moderate pain and understands that she should only escalate to the oxycodone IR if not controlled by Percocet initially.   Significant Diagnostics:  Hematology:  Lab 06/01/11 0557 05/31/11 1348 05/26/11 0215  WBC 13.0* 12.2* 12.9*  HGB 8.8* 8.9* 8.5*  HCT 25.3* 25.6* 24.0*  PLT 524* 529* 559*  RDW 17.6* 17.6* 18.4*  MCV 86.6 87.7 85.4  MCHC 34.8 34.8 35.4    Lab 06/01/11 0557 05/31/11 1348 05/26/11 0215  MRET 516.8* 508.1* 474.9*   Metabolic:  Lab 06/01/11 0557 05/31/11 1348 05/26/11 0215  NA 137 139 137  K 3.6 4.0 3.3*  CL 105 107 106  CO2 23 21 20   BUN 6 5* 7  CREATININE 0.61 0.51 0.59  GLUCOSE 106* 83 84  CALCIUM 9.1 8.8 8.9  MG -- -- --  PHOS -- -- --   MICRO None  IMAGING: None  Procedures:   IV Pain Meds (Dilaudid) - No PCA  Discharge Vitals and Exam:  Vitals: Patient Vitals for the past 24 hrs:  BP Temp Temp src Pulse Resp SpO2 Height Weight  06/01/11 0500 99/66 mmHg 98.6 F (37 C) Oral 93  18  97 % - -  05/31/11 2300 - - - - - - 5\' 3"  (1.6 m) 170 lb (77.111 kg)  05/31/11 2100 114/74 mmHg 97.4 F (36.3 C) Oral 98  18  99 % - -  05/31/11 1818 115/53 mmHg 98.1 F (36.7 C) Oral 96  18  100 % - -  05/31/11 1758 101/51 mmHg 98.4 F (36.9 C) Oral 90  19  100 % - -   Wt Readings from Last 3 Encounters:  05/31/11 170 lb (77.111 kg) (91.97%*)  05/13/11 166 lb (75.297 kg) (90.55%*)  05/06/11 166 lb 10.7 oz (75.6 kg) (90.84%*)   * Growth percentiles are  based on CDC 2-20 Years data.    Intake/Output Summary (Last 24 hours) at 06/01/11 1557 Last data filed at 06/01/11 0600  Gross per 24 hour  Intake    580 ml  Output      0 ml  Net    580 ml    PE: GENERAL: Young adult African American Female examined in [facility] ED. In mild discomfort; no resp distress  HNEENT: AT/[place], MMM, no scleral icterus THORAX:  HEART: RRR, s1/s2 heard, no murmur  LUNGS: CTA B, no crackles or wheezes  ABDOMEN: +BS, soft, non-tender, no masses, no guarding, no organomegaly  EXTREMITIES: Moves all 4 extremities spontaneously, no peripheral edema, 2+/4 LE pulses  >MSK/Osteopathic: Posterior R rib 5 with associated Tissue texture changes  >PSYCH: Appropriate mentation and affect. Appropriate behavior for given level of pain    Discharge Information   Disposition: Home or Self Care Discharge Diet: Resume diet Discharge Condition:  Improved Discharge Activity: Ad lib   Discharge Medication List as of 06/01/2011  3:09 PM    START taking these medications   Details  metaxalone (SKELAXIN) 800 MG tablet Take 1 tablet (800 mg total) by mouth 3 (three) times daily as needed for pain (or muscle spasm)., Starting 06/01/2011, Until Sat 06/11/11, Normal    oxyCODONE 10 MG TABS Take 1 tablet (10 mg total) by mouth every 6 (six) hours as needed (severe pain not controlled by percocet.  DO NOT TAKE within 2 hours of your percocet.)., Starting 06/01/2011, Until Sat 06/11/11, Print      CONTINUE these medications which have NOT CHANGED   Details  folic acid (FOLVITE) 1 MG tablet Take 1 mg by mouth daily., Until Discontinued, Historical Med    hydroxyurea (HYDREA) 500 MG capsule Take 500 mg by mouth 3 (three) times daily. May take with food to minimize GI side effects., Starting 05/19/2011, Until Sat 06/18/11, Historical Med    oxyCODONE-acetaminophen (PERCOCET) 5-325 MG per tablet Take 1 tablet by mouth every 4 (four) hours as needed., Starting 05/26/2011, Until Sun 06/05/11,  Historical Med    Prenatal Vit-Fe Fumarate-FA (PRENATAL MULTIVITAMIN) 60-1 MG tablet Take 1 tablet by mouth daily., Starting 04/19/2011, Until Wed 04/18/12, Normal    medroxyPROGESTERone (DEPO-PROVERA) 150 MG/ML injection Inject 150 mg into the muscle every 3 (three) months., Until Discontinued, Historical Med      STOP taking these medications     cyclobenzaprine (FLEXERIL) 5 MG tablet           Follow Up Issues and Recommendations:    Discharge Orders    Future Appointments: Provider: Department: Dept Phone: Center:   06/13/2011 10:30 AM [name], MD [facility] [phone] [facility]     Future Orders Please Complete By Expires   Diet -  low sodium heart healthy      Increase activity slowly        Follow-up Information    Follow up with CAVINESS,DAWN, MD .         Pending Results: none  Will need regularly scheduled follow up for her ongoing medical care.  Changes to her pain management plan including providing Oxycodone IR 10mg  for severe pain not controlled by Percocet 5/325; Additionally we have changed her muscle relaxer to Skelaxin as this provided relief for her while hospitalized and she reported on admission the flexeril has not been helping.    Gaspar Bidding, DO [facility] Resident - PGY-1 06/01/2011 3:57 PM [facility] Intern Page: [phone]

## 2011-06-01 NOTE — Progress Notes (Signed)
06-01-11 UR completed. Rockland Kotarski RN BSN  

## 2011-06-01 NOTE — H&P (Signed)
I have seen and examined this patient. I have discussed with Dr Berline Chough.  I agree with their findings and plans as documented in their admission note for today.  Agree with plan to increase the home opiate rescue dose at discharge. Agree with using oxycodone alone (without APAP).

## 2011-06-01 NOTE — Progress Notes (Signed)
Pt d/c in stable condition, instructions reviewed

## 2011-06-02 NOTE — Telephone Encounter (Signed)
Pt currently admitted in the hospital.  Will review needed lab work at time of discharge and order any labs at that time.

## 2011-06-02 NOTE — Discharge Summary (Signed)
I discussed with Dr Berline Chough.  I agree with their plans documented in their  Discharge Note.

## 2011-06-08 ENCOUNTER — Encounter: Payer: Self-pay | Admitting: Family Medicine

## 2011-06-08 NOTE — Telephone Encounter (Signed)
This encounter was created in error - please disregard.

## 2011-06-08 NOTE — Telephone Encounter (Signed)
error 

## 2011-06-09 ENCOUNTER — Other Ambulatory Visit: Payer: Self-pay

## 2011-06-09 ENCOUNTER — Observation Stay (HOSPITAL_COMMUNITY)
Admission: EM | Admit: 2011-06-09 | Discharge: 2011-06-09 | Disposition: A | Discharge status: Home / Self Care | Payer: Medicaid Other | Source: Ambulatory Visit | Attending: Emergency Medicine | Admitting: Emergency Medicine

## 2011-06-09 ENCOUNTER — Encounter (HOSPITAL_COMMUNITY): Payer: Self-pay | Admitting: *Deleted

## 2011-06-09 ENCOUNTER — Observation Stay (HOSPITAL_COMMUNITY): Payer: Medicaid Other

## 2011-06-09 DIAGNOSIS — D5700 Hb-SS disease with crisis, unspecified: Principal | ICD-10-CM | POA: Insufficient documentation

## 2011-06-09 LAB — URINALYSIS, ROUTINE W REFLEX MICROSCOPIC
Bilirubin Urine: NEGATIVE
Glucose, UA: NEGATIVE mg/dL
Hgb urine dipstick: NEGATIVE
Specific Gravity, Urine: 1.01 (ref 1.005–1.030)
pH: 7 (ref 5.0–8.0)

## 2011-06-09 LAB — CBC
HCT: 26.4 % — ABNORMAL LOW (ref 36.0–46.0)
Hemoglobin: 9.3 g/dL — ABNORMAL LOW (ref 12.0–15.0)
MCV: 87.1 fL (ref 78.0–100.0)
Platelets: 510 10*3/uL — ABNORMAL HIGH (ref 150–400)
RBC: 3.03 MIL/uL — ABNORMAL LOW (ref 3.87–5.11)
WBC: 14 10*3/uL — ABNORMAL HIGH (ref 4.0–10.5)

## 2011-06-09 LAB — COMPREHENSIVE METABOLIC PANEL WITH GFR
ALT: 14 U/L (ref 0–35)
AST: 23 U/L (ref 0–37)
Albumin: 3.7 g/dL (ref 3.5–5.2)
Alkaline Phosphatase: 47 U/L (ref 39–117)
BUN: 9 mg/dL (ref 6–23)
CO2: 23 meq/L (ref 19–32)
Calcium: 9.1 mg/dL (ref 8.4–10.5)
Chloride: 105 meq/L (ref 96–112)
Creatinine, Ser: 0.65 mg/dL (ref 0.50–1.10)
GFR calc Af Amer: >90 mL/min
GFR calc non Af Amer: >90 mL/min
Glucose, Bld: 93 mg/dL (ref 70–99)
Potassium: 3.8 meq/L (ref 3.5–5.1)
Sodium: 137 meq/L (ref 135–145)
Total Bilirubin: 1.7 mg/dL — ABNORMAL HIGH (ref 0.3–1.2)
Total Protein: 6.8 g/dL (ref 6.0–8.3)

## 2011-06-09 LAB — PROTIME-INR
INR: 1.15 (ref 0.00–1.49)
Prothrombin Time: 14.9 s (ref 11.6–15.2)

## 2011-06-09 LAB — APTT: aPTT: 34 s (ref 24–37)

## 2011-06-09 LAB — DIFFERENTIAL
Basophils Absolute: 0.1 K/uL (ref 0.0–0.1)
Basophils Relative: 0 % (ref 0–1)
Eosinophils Absolute: 0.5 K/uL (ref 0.0–0.7)
Eosinophils Relative: 4 % (ref 0–5)
Lymphocytes Relative: 28 % (ref 12–46)
Lymphs Abs: 3.9 K/uL (ref 0.7–4.0)
Monocytes Absolute: 1.5 K/uL — ABNORMAL HIGH (ref 0.1–1.0)
Monocytes Relative: 11 % (ref 3–12)
Neutro Abs: 8.1 K/uL — ABNORMAL HIGH (ref 1.7–7.7)
Neutrophils Relative %: 58 % (ref 43–77)

## 2011-06-09 LAB — RETICULOCYTES
RBC.: 3.03 MIL/uL — ABNORMAL LOW (ref 3.87–5.11)
Retic Count, Absolute: 484.8 K/uL — ABNORMAL HIGH (ref 19.0–186.0)
Retic Ct Pct: 16 % — ABNORMAL HIGH (ref 0.4–3.1)

## 2011-06-09 MED ORDER — SODIUM CHLORIDE 0.9 % IV BOLUS (SEPSIS)
1000.0000 mL | Freq: Once | INTRAVENOUS | Status: AC
  Administered 2011-06-09: 1000 mL via INTRAVENOUS

## 2011-06-09 MED ORDER — ONDANSETRON HCL 4 MG/2ML IJ SOLN
4.0000 mg | Freq: Four times a day (QID) | INTRAMUSCULAR | Status: DC | PRN
  Administered 2011-06-09: 4 mg via INTRAVENOUS
  Filled 2011-06-09: qty 2

## 2011-06-09 MED ORDER — DIPHENHYDRAMINE HCL 50 MG/ML IJ SOLN
25.0000 mg | Freq: Four times a day (QID) | INTRAMUSCULAR | Status: DC | PRN
  Administered 2011-06-09: 03:00:00 via INTRAVENOUS
  Filled 2011-06-09: qty 1

## 2011-06-09 MED ORDER — HYDROMORPHONE HCL PF 1 MG/ML IJ SOLN
2.0000 mg | INTRAMUSCULAR | Status: DC | PRN
  Administered 2011-06-09 (×3): 1 mg via INTRAVENOUS
  Filled 2011-06-09: qty 2
  Filled 2011-06-09 (×2): qty 1

## 2011-06-09 MED ORDER — OXYCODONE-ACETAMINOPHEN 5-325 MG PO TABS
2.0000 | ORAL_TABLET | ORAL | Status: DC | PRN
  Administered 2011-06-09: 2 via ORAL
  Filled 2011-06-09: qty 2

## 2011-06-09 MED ORDER — HYDROMORPHONE HCL PF 1 MG/ML IJ SOLN
1.0000 mg | Freq: Once | INTRAMUSCULAR | Status: AC
  Administered 2011-06-09: 1 mg via INTRAVENOUS
  Filled 2011-06-09: qty 1

## 2011-06-09 MED ORDER — OXYCODONE-ACETAMINOPHEN 10-325 MG PO TABS: 1.0000 | ORAL_TABLET | ORAL | Status: DC | PRN

## 2011-06-09 NOTE — ED Notes (Signed)
DR. Michaell Cowing SPOKE WITH PT. AND DISCUSSED PLAN OF CARE .

## 2011-06-09 NOTE — ED Notes (Signed)
Pt reports pain behind her ears, chest, back and neck.  Reports that it is a sickle cell pain.  CP increases with deep inspiration.

## 2011-06-09 NOTE — ED Notes (Signed)
DR. Michaell Cowing AT BEDSIDE EVALUATING PT.

## 2011-06-09 NOTE — ED Notes (Signed)
PT. REQUESTING ANOTHER PORTION OF FOOD SHE HAD JUST REQUESTED , NURSE ADVISED PT. SHE NEEDS TO FINISHED FIRST HER FOOD BEFORE SHE CAN  HAVE MORE , EXPLAINED POSSIBILTY OF GETTING NAUSEOUS DUE TO NARCOTIC PAIN MEDICATIONS. PT. GOT UPSET AND REQUESTED TO SPEAK WITH CHARGE NURSE. CHARGE NURSE NOTIFIED.

## 2011-06-09 NOTE — ED Notes (Signed)
IV NURSE NOTIFIED BY CHARGE NURSE TO START IV .

## 2011-06-09 NOTE — ED Notes (Signed)
IV NURSE AT BEDSIDE ATTEMPTING TO START IV ACCESS .

## 2011-06-09 NOTE — ED Notes (Signed)
Transported to xray 

## 2011-06-09 NOTE — ED Provider Notes (Addendum)
History     CSN: [id number]  Arrival date & time 06/09/11  0016   First MD Initiated Contact with Patient 06/09/11 [phone]      Chief Complaint  Patient presents with  . Sickle Cell Pain Crisis    (Consider location/radiation/quality/duration/timing/severity/associated sxs/prior treatment) HPI Comments: The patient was off of her hydroxyurea for approximately one week, last week, and today had onset of typical sickle cell pain crisis symptoms.  Patient is a 21 y.o. female presenting with sickle cell pain. The history is provided by the patient.  Sickle Cell Pain Crisis  This is a recurrent problem. The current episode started today. The problem occurs continuously. The problem has been unchanged. The pain is associated with cold exposure. Pain location: Chest and back, typical for the patient's sickle cell pain crises. Site of pain is localized in muscle and bone. The pain is similar to prior episodes. The pain is severe. The symptoms are relieved by nothing. Exacerbated by: nothing. Associated symptoms include chest pain, back pain and neck pain. Pertinent negatives include no blurred vision, no double vision, no photophobia, no abdominal pain, no constipation, no diarrhea, no nausea, no vomiting, no dysuria, no hematuria, no vaginal bleeding, no vaginal discharge, no congestion, no ear pain, no headaches, no rhinorrhea, no sore throat, no swollen glands, no joint pain, no neck stiffness, no loss of sensation, no tingling, no weakness, no cough, no difficulty breathing, no rash and no eye pain. The ear pain is moderate. There is pain in both ears. There is no abnormality behind the ear. There is no swelling present. She has been behaving normally. She has been eating and drinking normally. Urine output has been normal. The last void occurred less than 6 hours ago. She sickle cell type is SS. Recently, medical care has been given by the PCP. Services received include medications given.    Past  Medical History  Diagnosis Date  . H/O: 1 miscarriage 03/22/2011  . 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 Active smoker 08/09/2010  . Depression 01/06/2011  . GERD (gastroesophageal reflux disease) 02/17/2011  . Trichotillomania     h/o  . Blood transfusion   . Asthma   . Sickle cell anemia with crisis     Past Surgical History  Procedure Date  . Cholecystectomy 05/2010  . Dilation and curettage of uterus 02/20/11    History reviewed. No pertinent family history.  History  Substance Use Topics  . Smoking status: Former Games developer  . Smokeless tobacco: Never Used  . Alcohol Use: No    OB History    Grav Para Term Preterm Abortions TAB SAB Ect Mult Living   1    1           Review of Systems  Constitutional: Positive for fatigue. Negative for fever, chills and diaphoresis.  HENT: Positive for neck pain. Negative for ear pain, congestion, sore throat and rhinorrhea.   Eyes: Negative for blurred vision, double vision, photophobia and pain.  Respiratory: Negative for cough.   Cardiovascular: Positive for chest pain.  Gastrointestinal: Negative for nausea, vomiting, abdominal pain, diarrhea and constipation.  Genitourinary: Negative for dysuria, hematuria, vaginal bleeding and vaginal discharge.  Musculoskeletal: Positive for back pain. Negative for joint pain.  Skin: Negative for rash.  Neurological: Negative for tingling, weakness and headaches.  Hematological: Negative for adenopathy. Does not bruise/bleed easily.  Psychiatric/Behavioral: Negative.     Allergies  Toradol  Home Medications   Current Outpatient Rx  Name Route Sig Dispense Refill  .  FOLIC ACID 1 MG PO TABS Oral Take 1 mg by mouth daily.    Marland Kitchen HYDROXYUREA 500 MG PO CAPS Oral Take 500 mg by mouth 3 (three) times daily. May take with food to minimize GI side effects.    Marland Kitchen PRENATAL RX 60-1 MG PO TABS Oral Take 1 tablet by mouth daily. 90 tablet 1  . MEDROXYPROGESTERONE ACETATE 150 MG/ML IM SUSP Intramuscular Inject 150  mg into the muscle every 3 (three) months.      BP 99/48  Pulse 96  Temp(Src) 98.2 F (36.8 C) (Oral)  Resp 20  SpO2 100%  LMP 05/05/2011  Breastfeeding? Unknown  Physical Exam  Nursing note and vitals reviewed. Constitutional: She is oriented to person, place, and time. She appears well-developed and well-nourished. No distress.  HENT:  Head: Normocephalic and atraumatic. Head is without raccoon's eyes, without Battle's sign, without right periorbital erythema and without left periorbital erythema.  Right Ear: Hearing, tympanic membrane, external ear and ear canal normal. No swelling or tenderness. No mastoid tenderness. Tympanic membrane is not injected and not erythematous. No hemotympanum.  Left Ear: Hearing, tympanic membrane, external ear and ear canal normal. No swelling or tenderness. No mastoid tenderness. Tympanic membrane is not injected and not erythematous. No hemotympanum.  Nose: Nose normal. No mucosal edema, rhinorrhea or sinus tenderness. Right sinus exhibits no maxillary sinus tenderness and no frontal sinus tenderness. Left sinus exhibits no maxillary sinus tenderness and no frontal sinus tenderness.  Mouth/Throat: Uvula is midline and oropharynx is clear and moist. No oropharyngeal exudate, posterior oropharyngeal edema, posterior oropharyngeal erythema or tonsillar abscesses.  Eyes: Conjunctivae and EOM are normal. Pupils are equal, round, and reactive to light.  Neck: Normal range of motion.  Cardiovascular: Normal rate, regular rhythm, normal heart sounds and intact distal pulses.  Exam reveals no gallop and no friction rub.   No murmur heard. Pulmonary/Chest: Effort normal and breath sounds normal. No respiratory distress. She has no wheezes. She has no rales. She exhibits no tenderness.  Abdominal: Soft. Bowel sounds are normal. She exhibits no distension. There is no tenderness. There is no rebound and no guarding.  Musculoskeletal: Normal range of motion. She  exhibits no edema and no tenderness.  Neurological: She is alert and oriented to person, place, and time. She has normal reflexes. No cranial nerve deficit. She exhibits normal muscle tone. Coordination normal.  Skin: Skin is warm and dry. No rash noted. She is not diaphoretic. No erythema. No pallor.    ED Course  Procedures (including critical care time)   Date: 06/09/2011  Rate: 92  Rhythm: normal sinus rhythm  QRS Axis: normal  Intervals: normal  ST/T Wave abnormalities: nonspecific ST/T changes  Conduction Disutrbances:none  Narrative Interpretation: Non-provocative EKG  Old EKG Reviewed: unchanged    Labs Reviewed  CBC - Abnormal; Notable for the following:    WBC 14.0 (*)    RBC 3.03 (*)    Hemoglobin 9.3 (*)    HCT 26.4 (*)    RDW 16.4 (*)    Platelets 510 (*)    All other components within normal limits  DIFFERENTIAL - Abnormal; Notable for the following:    Neutro Abs 8.1 (*)    Monocytes Absolute 1.5 (*)    All other components within normal limits  COMPREHENSIVE METABOLIC PANEL - Abnormal; Notable for the following:    Total Bilirubin 1.7 (*)    All other components within normal limits  RETICULOCYTES - Abnormal; Notable for the following:  Retic Ct Pct 16.0 (*)    RBC. 3.03 (*)    Retic Count, Manual 484.8 (*)    All other components within normal limits  PREGNANCY, URINE  PROTIME-INR  APTT  URINALYSIS, ROUTINE W REFLEX MICROSCOPIC   Dg Chest 2 View  06/09/2011  *RADIOLOGY REPORT*  Clinical Data: Sickle cell crisis; chest pain and back pain.  CHEST - 2 VIEW  Comparison: Chest radiograph performed 05/19/2011  Findings: The lungs are well-aerated.  Minimal right midlung scarring is unchanged in appearance.  There is no evidence of focal opacification, pleural effusion or pneumothorax.  The heart is borderline normal in size; the mediastinal contour is within normal limits.  No acute osseous abnormalities are seen; vertebral body changes are again compatible  with the patient's history of sickle cell disease.  Clips are noted within the right upper quadrant, reflecting prior cholecystectomy.  IMPRESSION: No acute cardiopulmonary process seen.  Original Report Authenticated By: Tonia Ghent, M.D.     No diagnosis found.    MDM  The patient has an apparent sickle cell pain crisis. I will treat her with oxygen, IV fluid rehydration, and narcotic analgesics. Based on her lack of respiratory distress, even and unlabored respirations, and normal oxygen saturation on room air, I do not suspect acute chest syndrome, however I will obtain a chest x-ray to evaluate further for this. I've placed on the sickle cell pain crisis protocol including labs ordered to evaluate for sequestration crisis.   5:47 AM The patient reports that her pain is improving. She requests a refill as a pain medication at this time but states that she thinks given an extra dose of pain medicine and another hour she is likely going to be well enough to go home.  6:35 AM The patient states that she feels as though she is able to go home with prescription pain medication.  Felisa Bonier, MD 06/09/11 0231  Felisa Bonier, MD 06/09/11 1610  Felisa Bonier, MD 06/09/11 [phone]

## 2011-06-13 ENCOUNTER — Encounter: Payer: Self-pay | Admitting: Family Medicine

## 2011-06-13 ENCOUNTER — Ambulatory Visit (INDEPENDENT_AMBULATORY_CARE_PROVIDER_SITE_OTHER): Payer: Medicaid Other | Admitting: Family Medicine

## 2011-06-13 VITALS — BP 110/70 | HR 92 | Ht 63.0 in | Wt 173.0 lb

## 2011-06-13 DIAGNOSIS — Z733 Stress, not elsewhere classified: Secondary | ICD-10-CM

## 2011-06-13 DIAGNOSIS — D571 Sickle-cell disease without crisis: Secondary | ICD-10-CM

## 2011-06-13 DIAGNOSIS — F439 Reaction to severe stress, unspecified: Secondary | ICD-10-CM

## 2011-06-13 DIAGNOSIS — M549 Dorsalgia, unspecified: Secondary | ICD-10-CM

## 2011-06-13 LAB — COMPREHENSIVE METABOLIC PANEL
BUN: 10 mg/dL (ref 6–23)
CO2: 22 mEq/L (ref 19–32)
Calcium: 9.6 mg/dL (ref 8.4–10.5)
Chloride: 105 mEq/L (ref 96–112)
Creat: 0.59 mg/dL (ref 0.50–1.10)
Glucose, Bld: 75 mg/dL (ref 70–99)

## 2011-06-13 LAB — URIC ACID: Uric Acid, Serum: 5.9 mg/dL (ref 2.4–7.0)

## 2011-06-13 MED ORDER — OXYCODONE HCL 10 MG PO TABS: 10.0000 mg | ORAL_TABLET | Freq: Four times a day (QID) | ORAL | Status: DC | PRN

## 2011-06-13 MED ORDER — METAXALONE 800 MG PO TABS: 800.0000 mg | ORAL_TABLET | Freq: Three times a day (TID) | ORAL | Status: DC

## 2011-06-13 NOTE — Progress Notes (Signed)
  Subjective:    Patient ID: [name], female    DOB: [birth year], 21 y.o.   MRN: [id number]  HPI Hospital followup:  Sickle cell: Patient states that she saw Dr. Cheryle Horsfall with [facility]-last week. Restarted on hydroxyurea. Patient states that she wanted me to draw labs for followup on hydroxyurea and the fax  results to her. Patient does not have fax number with her today. Does not know what labs she would like drawn.  Patient requesting refill oxycodone 10 mg every 6 hours. Also asking for a prescription for Skelaxin. Continues to have right shoulder pain. And lower back pain. even between sickle cell crises. Last hospitalization was discharged from the ER on 05/31/11. No fever. No cough. No chest pain. Pain well controlled today on outpatient regimen.  Social concerns: Patient to call Nelva Bush as we discussed last appointment to make appointment. She still has not done this.     Review of Systems As per above.    Objective:   Physical Exam  Constitutional: She appears well-developed and well-nourished.  HENT:  Head: Normocephalic and atraumatic.  Eyes: Conjunctivae are normal. Pupils are equal, round, and reactive to light.  Cardiovascular: Normal rate, regular rhythm and normal heart sounds.   No murmur heard. Pulmonary/Chest: Effort normal. No respiratory distress. She has no wheezes. She has no rales. She exhibits no tenderness.  Musculoskeletal: She exhibits no edema.       Tenderness on palpation of right scapular area and lower back paraspinal muscles.   Neurological: She is alert.  Skin: No rash noted.          Assessment & Plan:

## 2011-06-13 NOTE — Progress Notes (Signed)
PA required for Metaxalone. Form placed in MD box.

## 2011-06-13 NOTE — Patient Instructions (Signed)
For back pain: Do back and shoulder exercises 2 x per day. Take pain meds prn  Call [facility] and make appt as we have discussed in the past  Call me with fax # that I should send lab results to.  Return for follow up in 1 month.    Shoulder Exercises EXERCISES  RANGE OF MOTION (ROM) AND STRETCHING EXERCISES These exercises may help you when beginning to rehabilitate your injury. Your symptoms may resolve with or without further involvement from your physician, physical therapist or athletic trainer. While completing these exercises, remember:   Restoring tissue flexibility helps normal motion to return to the joints. This allows healthier, less painful movement and activity.   An effective stretch should be held for at least 30 seconds.   A stretch should never be painful. You should only feel a gentle lengthening or release in the stretched tissue.  ROM - Pendulum  Bend at the waist so that your right / left arm falls away from your body. Support yourself with your opposite hand on a solid surface, such as a table or a countertop.   Your right / left arm should be perpendicular to the ground. If it is not perpendicular, you need to lean over farther. Relax the muscles in your right / left arm and shoulder as much as possible.   Gently sway your hips and trunk so they move your right / left arm without any use of your right / left shoulder muscles.   Progress your movements so that your right / left arm moves side to side, then forward and backward, and finally, both clockwise and counterclockwise.   Complete __________ repetitions in each direction. Many people use this exercise to relieve discomfort in their shoulder as well as to gain range of motion.  Repeat __________ times. Complete this exercise __________ times per day. STRETCH - Flexion, Standing  Stand with good posture. With an underhand grip on your right / left hand and an overhand grip on the opposite hand, grasp a  broomstick or cane so that your hands are a little more than shoulder-width apart.   Keeping your right / left elbow straight and shoulder muscles relaxed, push the stick with your opposite hand to raise your right / left arm in front of your body and then overhead. Raise your arm until you feel a stretch in your right / left shoulder, but before you have increased shoulder pain.   Try to avoid shrugging your right / left shoulder as your arm rises by keeping your shoulder blade tucked down and toward your mid-back spine. Hold __________ seconds.   Slowly return to the starting position.  Repeat __________ times. Complete this exercise __________ times per day. STRETCH - Internal Rotation  Place your right / left hand behind your back, palm-up.   Throw a towel or belt over your opposite shoulder. Grasp the towel/belt with your right / left hand.   While keeping an upright posture, gently pull up on the towel/belt until you feel a stretch in the front of your right / left shoulder.   Avoid shrugging your right / left shoulder as your arm rises by keeping your shoulder blade tucked down and toward your mid-back spine.   Hold __________. Release the stretch by lowering your opposite hand.  Repeat __________ times. Complete this exercise __________ times per day. STRETCH - External Rotation and Abduction  Stagger your stance through a doorframe. It does not matter which foot is forward.  As instructed by your physician, physical therapist or athletic trainer, place your hands:   And forearms above your head and on the door frame.   And forearms at head-height and on the door frame.   At elbow-height and on the door frame.   Keeping your head and chest upright and your stomach muscles tight to prevent over-extending your low-back, slowly shift your weight onto your front foot until you feel a stretch across your chest and/or in the front of your shoulders.   Hold __________ seconds.  Shift your weight to your back foot to release the stretch.  Repeat __________ times. Complete this stretch __________ times per day.  STRENGTHENING EXERCISES  These exercises may help you when beginning to rehabilitate your injury. They may resolve your symptoms with or without further involvement from your physician, physical therapist or athletic trainer. While completing these exercises, remember:   Muscles can gain both the endurance and the strength needed for everyday activities through controlled exercises.   Complete these exercises as instructed by your physician, physical therapist or athletic trainer. Progress the resistance and repetitions only as guided.   You may experience muscle soreness or fatigue, but the pain or discomfort you are trying to eliminate should never worsen during these exercises. If this pain does worsen, stop and make certain you are following the directions exactly. If the pain is still present after adjustments, discontinue the exercise until you can discuss the trouble with your clinician.   If advised by your physician, during your recovery, avoid activity or exercises which involve actions that place your right / left hand or elbow above your head or behind your back or head. These positions stress the tissues which are trying to heal.  STRENGTH - Scapular Depression and Adduction  With good posture, sit on a firm chair. Supported your arms in front of you with pillows, arm rests or a table top. Have your elbows in line with the sides of your body.   Gently draw your shoulder blades down and toward your mid-back spine. Gradually increase the tension without tensing the muscles along the top of your shoulders and the back of your neck.   Hold for __________ seconds. Slowly release the tension and relax your muscles completely before completing the next repetition.   After you have practiced this exercise, remove the arm support and complete it in standing as  well as sitting.  Repeat __________ times. Complete this exercise __________ times per day.  STRENGTH - External Rotators  Secure a rubber exercise band/tubing to a fixed object so that it is at the same height as your right / left elbow when you are standing or sitting on a firm surface.   Stand or sit so that the secured exercise band/tubing is at your side that is not injured.   Bend your elbow 90 degrees. Place a folded towel or small pillow under your right / left arm so that your elbow is a few inches away from your side.   Keeping the tension on the exercise band/tubing, pull it away from your body, as if pivoting on your elbow. Be sure to keep your body steady so that the movement is only coming from your shoulder rotating.   Hold __________ seconds. Release the tension in a controlled manner as you return to the starting position.  Repeat __________ times. Complete this exercise __________ times per day.  STRENGTH - Supraspinatus  Stand or sit with good posture. Grasp a __________ weight  or an exercise band/tubing so that your hand is "thumbs-up," like when you shake hands.   Slowly lift your right / left hand from your thigh into the air, traveling about 30 degrees from straight out at your side. Lift your hand to shoulder height or as far as you can without increasing any shoulder pain. Initially, many people do not lift their hands above shoulder height.   Avoid shrugging your right / left shoulder as your arm rises by keeping your shoulder blade tucked down and toward your mid-back spine.   Hold for __________ seconds. Control the descent of your hand as you slowly return to your starting position.  Repeat __________ times. Complete this exercise __________ times per day.  STRENGTH - Shoulder Extensors  Secure a rubber exercise band/tubing so that it is at the height of your shoulders when you are either standing or sitting on a firm arm-less chair.   With a thumbs-up grip,  grasp an end of the band/tubing in each hand. Straighten your elbows and lift your hands straight in front of you at shoulder height. Step back away from the secured end of band/tubing until it becomes tense.   Squeezing your shoulder blades together, pull your hands down to the sides of your thighs. Do not allow your hands to go behind you.   Hold for __________ seconds. Slowly ease the tension on the band/tubing as you reverse the directions and return to the starting position.  Repeat __________ times. Complete this exercise __________ times per day.  STRENGTH - Scapular Retractors  Secure a rubber exercise band/tubing so that it is at the height of your shoulders when you are either standing or sitting on a firm arm-less chair.   With a palm-down grip, grasp an end of the band/tubing in each hand. Straighten your elbows and lift your hands straight in front of you at shoulder height. Step back away from the secured end of band/tubing until it becomes tense.   Squeezing your shoulder blades together, draw your elbows back as you bend them. Keep your upper arm lifted away from your body throughout the exercise.   Hold __________ seconds. Slowly ease the tension on the band/tubing as you reverse the directions and return to the starting position.  Repeat __________ times. Complete this exercise __________ times per day. STRENGTH - Scapular Depressors  Find a sturdy chair without wheels, such as a from a dining room table.   Keeping your feet on the floor, lift your bottom from the seat and lock your elbows.   Keeping your elbows straight, allow gravity to pull your body weight down. Your shoulders will rise toward your ears.   Raise your body against gravity by drawing your shoulder blades down your back, shortening the distance between your shoulders and ears. Although your feet should always maintain contact with the floor, your feet should progressively support less body weight as you get  stronger.   Hold __________ seconds. In a controlled and slow manner, lower your body weight to begin the next repetition.  Repeat __________ times. Complete this exercise __________ times per day.  Document Released: 03/16/2005 Document Revised: 01/12/2011 Document Reviewed: 08/14/2008 [facility] Patient Information 2012 [place], [place].   Back Exercises Back exercises help treat and prevent back injuries. The goal of back exercises is to increase the strength of your abdominal and back muscles and the flexibility of your back. These exercises should be started when you no longer have back pain. Back exercises include:  Pelvic Tilt. Lie on your back with your knees bent. Tilt your pelvis until the lower part of your back is against the floor. Hold this position 5 to 10 sec and repeat 5 to 10 times.   Knee to Chest. Pull first 1 knee up against your chest and hold for 20 to 30 seconds, repeat this with the other knee, and then both knees. This may be done with the other leg straight or bent, whichever feels better.   Sit-Ups or Curl-Ups. Bend your knees 90 degrees. Start with tilting your pelvis, and do a partial, slow sit-up, lifting your trunk only 30 to 45 degrees off the floor. Take at least 2 to 3 seconds for each sit-up. Do not do sit-ups with your knees out straight. If partial sit-ups are difficult, simply do the above but with only tightening your abdominal muscles and holding it as directed.   Hip-Lift. Lie on your back with your knees flexed 90 degrees. Push down with your feet and shoulders as you raise your hips a couple inches off the floor; hold for 10 seconds, repeat 5 to 10 times.   Back arches. Lie on your stomach, propping yourself up on bent elbows. Slowly press on your hands, causing an arch in your low back. Repeat 3 to 5 times. Any initial stiffness and discomfort should lessen with repetition over time.   Shoulder-Lifts. Lie face down with arms beside your body. Keep hips  and torso pressed to floor as you slowly lift your head and shoulders off the floor.  Do not overdo your exercises, especially in the beginning. Exercises may cause you some mild back discomfort which lasts for a few minutes; however, if the pain is more severe, or lasts for more than 15 minutes, do not continue exercises until you see your caregiver. Improvement with exercise therapy for back problems is slow.   Back Exercises These exercises may help you when beginning to rehabilitate your injury. Your symptoms may resolve with or without further involvement from your physician, physical therapist or athletic trainer. While completing these exercises, remember:   Restoring tissue flexibility helps normal motion to return to the joints. This allows healthier, less painful movement and activity.   An effective stretch should be held for at least 30 seconds.   A stretch should never be painful. You should only feel a gentle lengthening or release in the stretched tissue.  STRETCH - Extension, Prone on Elbows   Lie on your stomach on the floor, a bed will be too soft. Place your palms about shoulder width apart and at the height of your head.   Place your elbows under your shoulders. If this is too painful, stack pillows under your chest.   Allow your body to relax so that your hips drop lower and make contact more completely with the floor.   Hold this position for __________ seconds.   Slowly return to lying flat on the floor.  Repeat __________ times. Complete this exercise __________ times per day.  RANGE OF MOTION - Extension, Prone Press Ups   Lie on your stomach on the floor, a bed will be too soft. Place your palms about shoulder width apart and at the height of your head.   Keeping your back as relaxed as possible, slowly straighten your elbows while keeping your hips on the floor. You may adjust the placement of your hands to maximize your comfort. As you gain motion, your hands  will come more underneath your shoulders.  Hold this position __________ seconds.   Slowly return to lying flat on the floor.  Repeat __________ times. Complete this exercise __________ times per day.  RANGE OF MOTION- Quadruped, Neutral Spine   Assume a hands and knees position on a firm surface. Keep your hands under your shoulders and your knees under your hips. You may place padding under your knees for comfort.   Drop your head and point your tail bone toward the ground below you. This will round out your low back like an angry cat. Hold this position for __________ seconds.   Slowly lift your head and release your tail bone so that your back sags into a large arch, like an old horse.   Hold this position for __________ seconds.   Repeat this until you feel limber in your low back.   Now, find your "sweet spot." This will be the most comfortable position somewhere between the two previous positions. This is your neutral spine. Once you have found this position, tense your stomach muscles to support your low back.   Hold this position for __________ seconds.  Repeat __________ times. Complete this exercise __________ times per day.  STRETCH - Flexion, Single Knee to Chest   Lie on a firm bed or floor with both legs extended in front of you.   Keeping one leg in contact with the floor, bring your opposite knee to your chest. Hold your leg in place by either grabbing behind your thigh or at your knee.   Pull until you feel a gentle stretch in your low back. Hold __________ seconds.   Slowly release your grasp and repeat the exercise with the opposite side.  Repeat __________ times. Complete this exercise __________ times per day.  STRETCH - Hamstrings, Standing  Stand or sit and extend your right / left leg, placing your foot on a chair or foot stool   Keeping a slight arch in your low back and your hips straight forward.   Lead with your chest and lean forward at the waist  until you feel a gentle stretch in the back of your right / left knee or thigh. (When done correctly, this exercise requires leaning only a small distance.)   Hold this position for __________ seconds.  Repeat __________ times. Complete this stretch __________ times per day. STRENGTHENING - Deep Abdominals, Pelvic Tilt   Lie on a firm bed or floor. Keeping your legs in front of you, bend your knees so they are both pointed toward the ceiling and your feet are flat on the floor.   Tense your lower abdominal muscles to press your low back into the floor. This motion will rotate your pelvis so that your tail bone is scooping upwards rather than pointing at your feet or into the floor.   With a gentle tension and even breathing, hold this position for __________ seconds.  Repeat __________ times. Complete this exercise __________ times per day.  STRENGTHENING - Abdominals, Crunches   Lie on a firm bed or floor. Keeping your legs in front of you, bend your knees so they are both pointed toward the ceiling and your feet are flat on the floor. Cross your arms over your chest.   Slightly tip your chin down without bending your neck.   Tense your abdominals and slowly lift your trunk high enough to just clear your shoulder blades. Lifting higher can put excessive stress on the low back and does not further strengthen your abdominal muscles.  Control your return to the starting position.  Repeat __________ times. Complete this exercise __________ times per day.  STRENGTHENING - Quadruped, Opposite UE/LE Lift   Assume a hands and knees position on a firm surface. Keep your hands under your shoulders and your knees under your hips. You may place padding under your knees for comfort.   Find your neutral spine and gently tense your abdominal muscles so that you can maintain this position. Your shoulders and hips should form a rectangle that is parallel with the floor and is not twisted.   Keeping your  trunk steady, lift your right hand no higher than your shoulder and then your left leg no higher than your hip. Make sure you are not holding your breath. Hold this position __________ seconds.   Continuing to keep your abdominal muscles tense and your back steady, slowly return to your starting position. Repeat with the opposite arm and leg.  Repeat __________ times. Complete this exercise __________ times per day. Document Released: 05/20/2005 Document Revised: 01/12/2011 Document Reviewed: 08/14/2008 [facility] Patient Information 2012 [place], [place]. See your caregivers for assistance with developing a proper back exercise program. Document Released: 06/09/2004 Document Revised: 12/29/2010 Document Reviewed: 05/02/2005 [facility] Patient Information 2012 [place], [place].

## 2011-06-14 ENCOUNTER — Telehealth: Payer: Self-pay | Admitting: Family Medicine

## 2011-06-14 ENCOUNTER — Encounter: Payer: Self-pay | Admitting: Family Medicine

## 2011-06-14 LAB — CBC WITH DIFFERENTIAL/PLATELET
Eosinophils Relative: 3 % (ref 0–5)
HCT: 26.8 % — ABNORMAL LOW (ref 36.0–46.0)
Lymphocytes Relative: 21 % (ref 12–46)
Lymphs Abs: 2.7 10*3/uL (ref 0.7–4.0)
MCV: 91.2 fL (ref 78.0–100.0)
Monocytes Absolute: 1.1 10*3/uL — ABNORMAL HIGH (ref 0.1–1.0)
RBC: 2.94 MIL/uL — ABNORMAL LOW (ref 3.87–5.11)
RDW: 19 % — ABNORMAL HIGH (ref 11.5–15.5)
WBC: 12.9 10*3/uL — ABNORMAL HIGH (ref 4.0–10.5)

## 2011-06-14 NOTE — Telephone Encounter (Signed)
Please call pt and let her know that medicaid will not cover the skelaxin at this point.  Pt did not have any improvement with flexeril.  I will send in an RX for robaxin to see if this helps.  This is on the preferred medication list.  Please call pt and let her know about this med change.  I want her to monitor her symptoms and let me know at her next follow up appt if the robaxin helps her back pain.

## 2011-06-15 ENCOUNTER — Telehealth: Payer: Self-pay | Admitting: Family Medicine

## 2011-06-15 ENCOUNTER — Other Ambulatory Visit: Payer: Self-pay | Admitting: Family Medicine

## 2011-06-15 MED ORDER — METHOCARBAMOL 750 MG PO TABS: 750.0000 mg | ORAL_TABLET | Freq: Four times a day (QID) | ORAL | Status: DC | PRN

## 2011-06-15 NOTE — Telephone Encounter (Signed)
Attempted calling patient to advises of message, however the phone number we have listed states person does not accept calls. Will await call back from patient . Will notify  pharmacy also.

## 2011-06-15 NOTE — Assessment & Plan Note (Signed)
Labs obtained per [facility] rec's for monitoring-  CBC with diff, cmet, uric acid.  Will send these results in letter.  Will request last hematology consult note.  Will also ask Hematologist to specify what labs she would like drawn and the frequency she would like them drawn.

## 2011-06-15 NOTE — Assessment & Plan Note (Signed)
I do not think that this is related to sickle cell.  This is most likely a muscular strain.  Pt to use pain meds prn.  Muscle relaxers and motrin should be first line.  Pt to do home exercises as per handout.  If no improvement at f/up appt in 1 month will consider referral to formal PT.

## 2011-06-15 NOTE — Assessment & Plan Note (Signed)
Pt encouraged to see norma.  Still has not made an appointment.

## 2011-06-15 NOTE — Telephone Encounter (Signed)
Patient is calling with the Fax number that was requested.  The fax number for [facility] is [phone].

## 2011-06-17 NOTE — Telephone Encounter (Signed)
Faxed letter and labs to [facility]. Nancy KitchenArlyss [name]

## 2011-06-20 ENCOUNTER — Emergency Department (HOSPITAL_COMMUNITY): Payer: Medicaid Other

## 2011-06-20 ENCOUNTER — Encounter (HOSPITAL_COMMUNITY): Payer: Self-pay | Admitting: Emergency Medicine

## 2011-06-20 ENCOUNTER — Inpatient Hospital Stay (HOSPITAL_COMMUNITY)
Admission: EM | Admit: 2011-06-20 | Discharge: 2011-06-27 | DRG: 812 | Disposition: A | Discharge status: Home / Self Care | Payer: Medicaid Other | Source: Ambulatory Visit | Attending: Family Medicine | Admitting: Family Medicine

## 2011-06-20 DIAGNOSIS — E663 Overweight: Secondary | ICD-10-CM | POA: Diagnosis present

## 2011-06-20 DIAGNOSIS — K219 Gastro-esophageal reflux disease without esophagitis: Secondary | ICD-10-CM | POA: Diagnosis present

## 2011-06-20 DIAGNOSIS — Z888 Allergy status to other drugs, medicaments and biological substances status: Secondary | ICD-10-CM

## 2011-06-20 DIAGNOSIS — K5900 Constipation, unspecified: Secondary | ICD-10-CM | POA: Diagnosis present

## 2011-06-20 DIAGNOSIS — F329 Major depressive disorder, single episode, unspecified: Secondary | ICD-10-CM

## 2011-06-20 DIAGNOSIS — Z79899 Other long term (current) drug therapy: Secondary | ICD-10-CM

## 2011-06-20 DIAGNOSIS — D5700 Hb-SS disease with crisis, unspecified: Principal | ICD-10-CM | POA: Diagnosis present

## 2011-06-20 DIAGNOSIS — Z87891 Personal history of nicotine dependence: Secondary | ICD-10-CM

## 2011-06-20 DIAGNOSIS — F6389 Other impulse disorders: Secondary | ICD-10-CM | POA: Diagnosis present

## 2011-06-20 DIAGNOSIS — F3289 Other specified depressive episodes: Secondary | ICD-10-CM

## 2011-06-20 DIAGNOSIS — R52 Pain, unspecified: Secondary | ICD-10-CM | POA: Diagnosis present

## 2011-06-20 DIAGNOSIS — R109 Unspecified abdominal pain: Secondary | ICD-10-CM | POA: Diagnosis present

## 2011-06-20 LAB — COMPREHENSIVE METABOLIC PANEL
Alkaline Phosphatase: 53 U/L (ref 39–117)
BUN: 7 mg/dL (ref 6–23)
GFR calc Af Amer: >90 mL/min (ref >90)
Glucose, Bld: 84 mg/dL (ref 70–99)
Potassium: 4 mEq/L (ref 3.5–5.1)
Total Bilirubin: 2.1 mg/dL — ABNORMAL HIGH (ref 0.3–1.2)
Total Protein: 7.8 g/dL (ref 6.0–8.3)

## 2011-06-20 LAB — DIFFERENTIAL
Eosinophils Absolute: 0.2 10*3/uL (ref 0.0–0.7)
Lymphs Abs: 3.1 10*3/uL (ref 0.7–4.0)
Monocytes Relative: 8 % (ref 3–12)
Neutrophils Relative %: 67 % (ref 43–77)

## 2011-06-20 LAB — CBC
HCT: 27.5 % — ABNORMAL LOW (ref 36.0–46.0)
Hemoglobin: 9.8 g/dL — ABNORMAL LOW (ref 12.0–15.0)
MCH: 32.2 pg (ref 26.0–34.0)
MCV: 90.5 fL (ref 78.0–100.0)
RBC: 3.04 MIL/uL — ABNORMAL LOW (ref 3.87–5.11)

## 2011-06-20 MED ORDER — SODIUM CHLORIDE 0.9 % IV BOLUS (SEPSIS)
1000.0000 mL | Freq: Once | INTRAVENOUS | Status: AC
  Administered 2011-06-20: 1000 mL via INTRAVENOUS

## 2011-06-20 MED ORDER — HYDROMORPHONE HCL PF 1 MG/ML IJ SOLN
1.0000 mg | Freq: Once | INTRAMUSCULAR | Status: AC
  Administered 2011-06-20: 1 mg via INTRAVENOUS
  Filled 2011-06-20: qty 1

## 2011-06-20 MED ORDER — ONDANSETRON HCL 4 MG/2ML IJ SOLN
4.0000 mg | Freq: Once | INTRAMUSCULAR | Status: AC
  Administered 2011-06-20: 4 mg via INTRAVENOUS
  Filled 2011-06-20: qty 2

## 2011-06-20 MED ORDER — HYDROMORPHONE HCL PF 1 MG/ML IJ SOLN
1.0000 mg | INTRAMUSCULAR | Status: AC
  Administered 2011-06-20: 1 mg via INTRAVENOUS
  Filled 2011-06-20: qty 1

## 2011-06-20 NOTE — ED Notes (Signed)
Pt c/o pain is back and abd. With nausea and vomiting.  Also c/o being constipated

## 2011-06-20 NOTE — ED Provider Notes (Signed)
1120 pmComplains of left flank pain radiating to left abdomen typical of sickle cell crises had in the past. Vomited one time presently requesting more pain medicine patient alert Glasgow Coma Score 15 appears mildly 11:55 PM nurse reports that patient fell in the bathroom while attempting you've urine sample patient states that when she stood up from the toilet her right knee "buckled causing her to fall directly on her right anterior knee I exam patient is uncomfortable right lower extremity knee is tender anteriorly no deformity or swelling. Additional dilaudid ordered for pain and right knee. 1:45 a.m. patient does not feel that pain is under control. Family medicine resident physician called to evaluate patient for addition.  Results for orders placed during the hospital encounter of 06/20/11  CBC      Component Value Range   WBC 13.5 (*) 4.0 - 10.5 (K/uL)   RBC 3.04 (*) 3.87 - 5.11 (MIL/uL)   Hemoglobin 9.8 (*) 12.0 - 15.0 (g/dL)   HCT 40.9 (*) 81.1 - 46.0 (%)   MCV 90.5  78.0 - 100.0 (fL)   MCH 32.2  26.0 - 34.0 (pg)   MCHC 35.6  30.0 - 36.0 (g/dL)   RDW 91.4 (*) 78.2 - 15.5 (%)   Platelets 514 (*) 150 - 400 (K/uL)  DIFFERENTIAL      Component Value Range   Neutrophils Relative 67  43 - 77 (%)   Neutro Abs 9.0 (*) 1.7 - 7.7 (K/uL)   Lymphocytes Relative 23  12 - 46 (%)   Lymphs Abs 3.1  0.7 - 4.0 (K/uL)   Monocytes Relative 8  3 - 12 (%)   Monocytes Absolute 1.1 (*) 0.1 - 1.0 (K/uL)   Eosinophils Relative 2  0 - 5 (%)   Eosinophils Absolute 0.2  0.0 - 0.7 (K/uL)   Basophils Relative 1  0 - 1 (%)   Basophils Absolute 0.1  0.0 - 0.1 (K/uL)  COMPREHENSIVE METABOLIC PANEL      Component Value Range   Sodium 139  135 - 145 (mEq/L)   Potassium 4.0  3.5 - 5.1 (mEq/L)   Chloride 105  96 - 112 (mEq/L)   CO2 22  19 - 32 (mEq/L)   Glucose, Bld 84  70 - 99 (mg/dL)   BUN 7  6 - 23 (mg/dL)   Creatinine, Ser 9.56  0.50 - 1.10 (mg/dL)   Calcium 9.5  8.4 - 21.3 (mg/dL)   Total Protein 7.8   6.0 - 8.3 (g/dL)   Albumin 4.5  3.5 - 5.2 (g/dL)   AST 27  0 - 37 (U/L)   ALT 20  0 - 35 (U/L)   Alkaline Phosphatase 53  39 - 117 (U/L)   Total Bilirubin 2.1 (*) 0.3 - 1.2 (mg/dL)   GFR calc non Af Amer >90  >90 (mL/min)   GFR calc Af Amer >90  >90 (mL/min)  URINALYSIS, ROUTINE W REFLEX MICROSCOPIC      Component Value Range   Color, Urine YELLOW  YELLOW    APPearance CLEAR  CLEAR    Specific Gravity, Urine 1.013  1.005 - 1.030    pH 5.5  5.0 - 8.0    Glucose, UA NEGATIVE  NEGATIVE (mg/dL)   Hgb urine dipstick NEGATIVE  NEGATIVE    Bilirubin Urine NEGATIVE  NEGATIVE    Ketones, ur NEGATIVE  NEGATIVE (mg/dL)   Protein, ur NEGATIVE  NEGATIVE (mg/dL)   Urobilinogen, UA 0.2  0.0 - 1.0 (mg/dL)   Nitrite NEGATIVE  NEGATIVE    Leukocytes, UA NEGATIVE  NEGATIVE   PREGNANCY, URINE      Component Value Range   Preg Test, Ur NEGATIVE  NEGATIVE   LIPASE, BLOOD      Component Value Range   Lipase 33  11 - 59 (U/L)  RETICULOCYTES      Component Value Range   Retic Ct Pct 11.1 (*) 0.4 - 3.1 (%)   RBC. 3.04 (*) 3.87 - 5.11 (MIL/uL)   Retic Count, Manual 337.4 (*) 19.0 - 186.0 (K/uL)   Dg Chest 2 View  06/09/2011  *RADIOLOGY REPORT*  Clinical Data: Sickle cell crisis; chest pain and back pain.  CHEST - 2 VIEW  Comparison: Chest radiograph performed 05/19/2011  Findings: The lungs are well-aerated.  Minimal right midlung scarring is unchanged in appearance.  There is no evidence of focal opacification, pleural effusion or pneumothorax.  The heart is borderline normal in size; the mediastinal contour is within normal limits.  No acute osseous abnormalities are seen; vertebral body changes are again compatible with the patient's history of sickle cell disease.  Clips are noted within the right upper quadrant, reflecting prior cholecystectomy.  IMPRESSION: No acute cardiopulmonary process seen.  Original Report Authenticated By: Tonia Ghent, M.D.   Ct Abdomen Pelvis W Contrast  06/21/2011   *RADIOLOGY REPORT*  Clinical Data: Left-sided abdominal pain.  CT ABDOMEN AND PELVIS WITH CONTRAST  Technique:  Multidetector CT imaging of the abdomen and pelvis was performed following the standard protocol during bolus administration of intravenous contrast.  Contrast: 40mL OMNIPAQUE IOHEXOL 300 MG/ML IV SOLN, OMNIPAQUE IOHEXOL 300 MG/ML IV SOLN  Comparison: 06/20/2011 radiograph  Findings: Limited images through the lung bases demonstrate no significant appreciable abnormality. The heart size is within normal limits. No pleural or pericardial effusion.  Unremarkable liver, pancreas, adrenal glands.  Status post cholecystectomy.  No biliary ductal dilatation.  Lobular splenic contour.  Symmetric renal enhancement.  No hydronephrosis or hydroureter.  No bowel obstruction.  No CT evidence for colitis.  Normal appendix.  Normal terminal ileum.  Subcentimeter ileocolic lymph nodes.  Circumferential bladder wall thickening is nonspecific given incomplete distension.  CT appearance to the uterus and adnexa within normal limits.  Circumaortic left renal vein.  Otherwise, normal caliber vasculature.  H shaped vertebrae with areas of sclerosis is in keeping with sequelae of sickle cell disease. 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  Osseous changes of sickle cell disease.  Original Report Authenticated By: Waneta Martins, M.D.   Dg Knee Complete 4 Views Right  06/21/2011  *RADIOLOGY REPORT*  Clinical Data: Pain post fall.  RIGHT KNEE - COMPLETE 4+ VIEW  Comparison: 03/15/2011  Findings: No effusion.  Subtle sclerosis in the proximal tibial diaphysis is stable. Negative for fracture, dislocation, or other acute abnormality.  Normal alignment and mineralization. No significant degenerative change.  Regional soft tissues unremarkable.  IMPRESSION:  Negative  Original  Report Authenticated By: Osa Craver, M.D.   Dg Abd Acute W/chest  06/20/2011  *RADIOLOGY REPORT*  Clinical Data: Abdominal pain  ACUTE ABDOMEN SERIES (ABDOMEN 2 VIEW & CHEST 1 VIEW)  Comparison: 05/08/2012 and earlier studies  Findings: Stable linear scarring or subsegmental atelectasis in the right upper lobe.  Lungs otherwise clear.  Heart size normal.  No effusion.  No free air.  Vascular clips in the right upper abdomen.  A  few gas filled nondilated mid abdominal small bowel loops, with scattered fluid levels on the erect radiograph.  Moderate fecal material in the proximal colon, decompressed distally.  No abnormal abdominal calcifications.  Regional bones unremarkable.  IMPRESSION:  1.  Fluid levels in dilated central small bowel which may represent an early adynamic ileus or early/partial small bowel obstruction. Consider follow-up films or CT if symptoms persist. 2.  No acute cardiopulmonary disease. 3.  No free air.  Original Report Authenticated By: Osa Craver, M.D.    Diagnosis #1 sickle cell crisis #2 fall #3 contusion to right knee  Doug Sou, MD 06/21/11 0151

## 2011-06-20 NOTE — ED Notes (Signed)
Called to radiology.  Pt reports pain to iv site. Noted site appears swollen and will not draw back blood.  Iv d/c, cath intact, 2 x 2 applied to site.

## 2011-06-20 NOTE — ED Notes (Signed)
IV team en route to assist iv placement for CAT scan.

## 2011-06-20 NOTE — ED Notes (Signed)
Pt c/o lower back, abdominal pain and rt knee pain. Hx sickle cell, states same pain.Pt also reports she has vomited and is constipated.  Pt noted alert and in no acute distress. Skin warm and dry. Resp are unlabored.

## 2011-06-20 NOTE — ED Provider Notes (Signed)
History     CSN: [id number]  Arrival date & time 06/20/11  1644   First MD Initiated Contact with Patient 06/20/11 1953      Chief Complaint  Patient presents with  . Sickle Cell Pain Crisis    (Consider location/radiation/quality/duration/timing/severity/associated sxs/prior treatment) HPI  Past Medical History  Diagnosis Date  . H/O: 1 miscarriage 03/22/2011  . 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 Active smoker 08/09/2010  . Depression 01/06/2011  . GERD (gastroesophageal reflux disease) 02/17/2011  . Trichotillomania     h/o  . Blood transfusion   . Asthma   . Sickle cell anemia with crisis     Past Surgical History  Procedure Date  . Cholecystectomy 05/2010  . Dilation and curettage of uterus 02/20/11    No family history on file.  History  Substance Use Topics  . Smoking status: Former Games developer  . Smokeless tobacco: Never Used  . Alcohol Use: No    OB History    Grav Para Term Preterm Abortions TAB SAB Ect Mult Living   1    1           Review of Systems  Allergies  Toradol  Home Medications   Current Outpatient Rx  Name Route Sig Dispense Refill  . FOLIC ACID 1 MG PO TABS Oral Take 1 mg by mouth daily.    Marland Kitchen HYDROXYUREA 500 MG PO CAPS Oral Take 1,000 mg by mouth every morning. May take with food to minimize GI side effects.    . OXYCODONE HCL 10 MG PO TABS Oral Take 10 mg by mouth every 6 (six) hours as needed. For pain    . PRENATAL RX 60-1 MG PO TABS Oral Take 1 tablet by mouth every morning.    Marland Kitchen MEDROXYPROGESTERONE ACETATE 150 MG/ML IM SUSP Intramuscular Inject 150 mg into the muscle every 3 (three) months. Last dose 04/20/11      BP 114/62  Pulse 91  Temp(Src) 99.2 F (37.3 C) (Oral)  Resp 16  SpO2 99%  LMP 05/05/2011  Physical Exam  ED Course  Procedures (including critical care time)  Labs Reviewed  CBC - Abnormal; Notable for the following:    WBC 13.5 (*)    RBC 3.04 (*)    Hemoglobin 9.8 (*)    HCT 27.5 (*)    RDW 17.9 (*)    Platelets 514 (*)    All other components within normal limits  DIFFERENTIAL - Abnormal; Notable for the following:    Neutro Abs 9.0 (*)    Monocytes Absolute 1.1 (*)    All other components within normal limits  COMPREHENSIVE METABOLIC PANEL - Abnormal; Notable for the following:    Total Bilirubin 2.1 (*)    All other components within normal limits  RETICULOCYTES - Abnormal; Notable for the following:    Retic Ct Pct 11.1 (*)    RBC. 3.04 (*)    Retic Count, Manual 337.4 (*)    All other components within normal limits  LIPASE, BLOOD  URINALYSIS, ROUTINE W REFLEX MICROSCOPIC  PREGNANCY, URINE   Dg Abd Acute W/chest  06/20/2011  *RADIOLOGY REPORT*  Clinical Data: Abdominal pain  ACUTE ABDOMEN SERIES (ABDOMEN 2 VIEW & CHEST 1 VIEW)  Comparison: 05/08/2012 and earlier studies  Findings: Stable linear scarring or subsegmental atelectasis in the right upper lobe.  Lungs otherwise clear.  Heart size normal.  No effusion.  No free air.  Vascular clips in the right upper abdomen.  A few gas filled nondilated  mid abdominal small bowel loops, with scattered fluid levels on the erect radiograph.  Moderate fecal material in the proximal colon, decompressed distally.  No abnormal abdominal calcifications.  Regional bones unremarkable.  IMPRESSION:  1.  Fluid levels in dilated central small bowel which may represent an early adynamic ileus or early/partial small bowel obstruction. Consider follow-up films or CT if symptoms persist. 2.  No acute cardiopulmonary disease. 3.  No free air.  Original Report Authenticated By: Osa Craver, M.D.     No diagnosis found.    MDM  Duplicate note        Doug Sou, MD 06/21/11 [phone]

## 2011-06-20 NOTE — ED Notes (Signed)
INT attempted x 1. Unsuccessful 

## 2011-06-20 NOTE — ED Notes (Signed)
Patient transported to X-ray 

## 2011-06-20 NOTE — ED Provider Notes (Signed)
History     CSN: [id number]  Arrival date & time 06/20/11  1644   First MD Initiated Contact with Patient 06/20/11 1953      Chief Complaint  Patient presents with  . Sickle Cell Pain Crisis    (Consider location/radiation/quality/duration/timing/severity/associated sxs/prior treatment) Patient is a 21 y.o. female presenting with sickle cell pain. The history is provided by the patient.  Sickle Cell Pain Crisis  This is a recurrent problem. The current episode started today. The problem occurs frequently. The problem has been gradually worsening. Associated symptoms include abdominal pain, constipation, nausea, vomiting and back pain. Pertinent negatives include no chest pain, no diarrhea, no dysuria, no vaginal bleeding, no vaginal discharge, no headaches, no sore throat, no neck pain and no weakness.  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 She also states she is having LUQ/LLQ abdominal pain that has been present for several days. No fever, chills, cough, chest pain, urinary or vaginal symptoms. +constipation  Past Medical History  Diagnosis Date  . H/O: 1 miscarriage 03/22/2011  . 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 Active smoker 08/09/2010  . Depression 01/06/2011  . GERD (gastroesophageal reflux disease) 02/17/2011  . Trichotillomania     h/o  . Blood transfusion   . Sickle cell anemia with crisis     Past Surgical History  Procedure Date  . Cholecystectomy 05/2010  . Dilation and curettage of uterus 02/20/11    History reviewed. No pertinent family history.  History  Substance Use Topics  . Smoking status: Former Games developer  . Smokeless tobacco: Never Used  . Alcohol Use: No    OB History    Grav Para Term Preterm Abortions TAB SAB Ect Mult Living   1    1           Review of Systems  Constitutional: Negative for fever and chills.  HENT: Negative for sore throat, neck pain and neck stiffness.   Respiratory: Negative for  choking, chest tightness, shortness of breath and wheezing.   Cardiovascular: Negative for chest pain, palpitations and leg swelling.  Gastrointestinal: Positive for nausea, vomiting, abdominal pain and constipation. Negative for diarrhea.  Genitourinary: Negative for dysuria, flank pain, vaginal bleeding and vaginal discharge.  Musculoskeletal: Positive for back pain. Negative for joint swelling and arthralgias.  Neurological: Negative for dizziness, weakness, light-headedness, numbness and headaches.    Allergies  Toradol  Home Medications   No current outpatient prescriptions on file.  BP 101/61  Pulse 98  Temp(Src) 98.6 F (37 C) (Oral)  Resp 18  Ht 5\' 3"  (1.6 m)  Wt 175 lb (79.379 kg)  BMI 31.00 kg/m2  SpO2 94%  LMP 05/05/2011  Physical Exam  Nursing note and vitals reviewed. Constitutional: She is oriented to person, place, and time. She appears well-developed and well-nourished. No distress.  HENT:  Head: Normocephalic and atraumatic.  Mouth/Throat: Oropharynx is clear and moist.  Eyes: EOM are normal. Pupils are equal, round, and reactive to light.  Neck: Normal range of motion. Neck supple.  Cardiovascular: Normal rate and regular rhythm.   Pulmonary/Chest: Effort normal and breath sounds normal. No respiratory distress. She has no wheezes. She has no rales. She exhibits no tenderness.  Abdominal: Soft. Bowel sounds are normal. There is tenderness (Mild ttp LUQ/LLQ. No rebound or guarding. Mild fullness noted). There is no rebound and no guarding.  Musculoskeletal: Normal range of motion. She exhibits tenderness (Pt has ttp over R lumbar  paraspinal muscles). She exhibits no edema.  Neurological: She is alert and oriented to person, place, and time.  Skin: Skin is warm and dry. No rash noted. No erythema.  Psychiatric: She has a normal mood and affect. Her behavior is normal.    ED Course  Procedures (including critical care time)  Labs Reviewed  CBC - Abnormal;  Notable for the following:    WBC 13.5 (*)    RBC 3.04 (*)    Hemoglobin 9.8 (*)    HCT 27.5 (*)    RDW 17.9 (*)    Platelets 514 (*)    All other components within normal limits  DIFFERENTIAL - Abnormal; Notable for the following:    Neutro Abs 9.0 (*)    Monocytes Absolute 1.1 (*)    All other components within normal limits  COMPREHENSIVE METABOLIC PANEL - Abnormal; Notable for the following:    Total Bilirubin 2.1 (*)    All other components within normal limits  RETICULOCYTES - Abnormal; Notable for the following:    Retic Ct Pct 11.1 (*)    RBC. 3.04 (*)    Retic Count, Manual 337.4 (*)    All other components within normal limits  CBC - Abnormal; Notable for the following:    WBC 13.1 (*)    RBC 2.84 (*)    Hemoglobin 9.0 (*)    HCT 25.5 (*)    RDW 17.5 (*)    Platelets 541 (*)    All other components within normal limits  URINALYSIS, ROUTINE W REFLEX MICROSCOPIC  PREGNANCY, URINE  LIPASE, BLOOD  CREATININE, SERUM   Ct Abdomen Pelvis W Contrast  06/21/2011  *RADIOLOGY REPORT*  Clinical Data: Left-sided abdominal pain.  CT ABDOMEN AND PELVIS WITH CONTRAST  Technique:  Multidetector CT imaging of the abdomen and pelvis was performed following the standard protocol during bolus administration of intravenous contrast.  Contrast: 40mL OMNIPAQUE IOHEXOL 300 MG/ML IV SOLN, OMNIPAQUE IOHEXOL 300 MG/ML IV SOLN  Comparison: 06/20/2011 radiograph  Findings: Limited images through the lung bases demonstrate no significant appreciable abnormality. The heart size is within normal limits. No pleural or pericardial effusion.  Unremarkable liver, pancreas, adrenal glands.  Status post cholecystectomy.  No biliary ductal dilatation.  Lobular splenic contour.  Symmetric renal enhancement.  No hydronephrosis or hydroureter.  No bowel obstruction.  No CT evidence for colitis.  Normal appendix.  Normal terminal ileum.  Subcentimeter ileocolic lymph nodes.  Circumferential bladder wall  thickening is nonspecific given incomplete distension.  CT appearance to the uterus and adnexa within normal limits.  Circumaortic left renal vein.  Otherwise, normal caliber vasculature.  H shaped vertebrae with areas of sclerosis is in keeping with sequelae of sickle cell disease. 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  Osseous changes of sickle cell disease.  Original Report Authenticated By: Waneta Martins, M.D.   Dg Knee Complete 4 Views Right  06/21/2011  *RADIOLOGY REPORT*  Clinical Data: Pain post fall.  RIGHT KNEE - COMPLETE 4+ VIEW  Comparison: 03/15/2011  Findings: No effusion.  Subtle sclerosis in the proximal tibial diaphysis is stable. Negative for fracture, dislocation, or other acute abnormality.  Normal alignment and mineralization. No significant degenerative change.  Regional soft tissues unremarkable.  IMPRESSION:  Negative  Original Report Authenticated By: Osa Craver, M.D.   Dg Abd Acute W/chest  06/20/2011  *RADIOLOGY REPORT*  Clinical Data: Abdominal pain  ACUTE ABDOMEN SERIES (ABDOMEN 2 VIEW & CHEST 1 VIEW)  Comparison: 05/08/2012 and earlier studies  Findings: Stable linear scarring or subsegmental atelectasis in the right upper lobe.  Lungs otherwise clear.  Heart size normal.  No effusion.  No free air.  Vascular clips in the right upper abdomen.  A few gas filled nondilated mid abdominal small bowel loops, with scattered fluid levels on the erect radiograph.  Moderate fecal material in the proximal colon, decompressed distally.  No abnormal abdominal calcifications.  Regional bones unremarkable.  IMPRESSION:  1.  Fluid levels in dilated central small bowel which may represent an early adynamic ileus or early/partial small bowel obstruction. Consider follow-up films or CT if symptoms persist. 2.  No acute cardiopulmonary  disease. 3.  No free air.  Original Report Authenticated By: Osa Craver, M.D.     1. Sickle cell crisis       MDM          Loren Racer, MD 06/22/11 [phone]

## 2011-06-21 ENCOUNTER — Emergency Department (HOSPITAL_COMMUNITY): Payer: Medicaid Other

## 2011-06-21 ENCOUNTER — Encounter (HOSPITAL_COMMUNITY): Payer: Self-pay | Admitting: *Deleted

## 2011-06-21 LAB — CBC
HCT: 25.5 % — ABNORMAL LOW (ref 36.0–46.0)
MCHC: 35.3 g/dL (ref 30.0–36.0)
Platelets: 541 10*3/uL — ABNORMAL HIGH (ref 150–400)
RDW: 17.5 % — ABNORMAL HIGH (ref 11.5–15.5)
WBC: 13.1 10*3/uL — ABNORMAL HIGH (ref 4.0–10.5)

## 2011-06-21 LAB — URINALYSIS, ROUTINE W REFLEX MICROSCOPIC
Ketones, ur: NEGATIVE mg/dL
Leukocytes, UA: NEGATIVE
Nitrite: NEGATIVE
Protein, ur: NEGATIVE mg/dL
Urobilinogen, UA: 0.2 mg/dL (ref 0.0–1.0)
pH: 5.5 (ref 5.0–8.0)

## 2011-06-21 LAB — CREATININE, SERUM
GFR calc Af Amer: >90 mL/min (ref >90)
GFR calc non Af Amer: >90 mL/min (ref >90)

## 2011-06-21 MED ORDER — HYDROMORPHONE HCL PF 1 MG/ML IJ SOLN
0.5000 mg | INTRAMUSCULAR | Status: DC | PRN
  Administered 2011-06-21 – 2011-06-22 (×6): 0.5 mg via INTRAVENOUS
  Filled 2011-06-21 (×8): qty 1

## 2011-06-21 MED ORDER — ACETAMINOPHEN 650 MG RE SUPP: 650.0000 mg | RECTAL | Status: DC | PRN

## 2011-06-21 MED ORDER — POLYETHYLENE GLYCOL 3350 17 G PO PACK
17.0000 g | PACK | Freq: Every day | ORAL | Status: DC
  Administered 2011-06-21 – 2011-06-23 (×3): 17 g via ORAL
  Filled 2011-06-21 (×3): qty 1

## 2011-06-21 MED ORDER — ONDANSETRON HCL 4 MG PO TABS: 4.0000 mg | ORAL_TABLET | ORAL | Status: DC | PRN

## 2011-06-21 MED ORDER — METHOCARBAMOL 750 MG PO TABS
750.0000 mg | ORAL_TABLET | Freq: Four times a day (QID) | ORAL | Status: DC | PRN
  Administered 2011-06-26 – 2011-06-27 (×3): 750 mg via ORAL
  Filled 2011-06-21 (×4): qty 1

## 2011-06-21 MED ORDER — DIPHENHYDRAMINE HCL 25 MG PO CAPS
25.0000 mg | ORAL_CAPSULE | Freq: Four times a day (QID) | ORAL | Status: DC | PRN
  Administered 2011-06-21: 25 mg via ORAL
  Filled 2011-06-21: qty 1

## 2011-06-21 MED ORDER — PRENATAL PLUS 27-1 MG PO TABS
1.0000 | ORAL_TABLET | ORAL | Status: DC
  Administered 2011-06-21 – 2011-06-27 (×7): 1 via ORAL
  Filled 2011-06-21 (×9): qty 1

## 2011-06-21 MED ORDER — HYDROMORPHONE HCL PF 1 MG/ML IJ SOLN
1.0000 mg | Freq: Once | INTRAMUSCULAR | Status: AC
  Administered 2011-06-21: 1 mg via INTRAVENOUS
  Filled 2011-06-21: qty 1

## 2011-06-21 MED ORDER — FOLIC ACID 1 MG PO TABS
1.0000 mg | ORAL_TABLET | Freq: Every day | ORAL | Status: DC
  Administered 2011-06-21 – 2011-06-27 (×7): 1 mg via ORAL
  Filled 2011-06-21 (×7): qty 1

## 2011-06-21 MED ORDER — SODIUM CHLORIDE 0.9 % IV SOLN
INTRAVENOUS | Status: DC
  Administered 2011-06-21 – 2011-06-25 (×11): via INTRAVENOUS

## 2011-06-21 MED ORDER — ACETAMINOPHEN 325 MG PO TABS
650.0000 mg | ORAL_TABLET | ORAL | Status: DC | PRN
  Administered 2011-06-26: 650 mg via ORAL
  Filled 2011-06-21 (×14): qty 2

## 2011-06-21 MED ORDER — HEPARIN SODIUM (PORCINE) 5000 UNIT/ML IJ SOLN
5000.0000 [IU] | Freq: Three times a day (TID) | INTRAMUSCULAR | Status: DC
  Administered 2011-06-21 – 2011-06-24 (×8): 5000 [IU] via SUBCUTANEOUS
  Filled 2011-06-21 (×22): qty 1

## 2011-06-21 MED ORDER — HYDROMORPHONE HCL PF 1 MG/ML IJ SOLN
1.0000 mg | INTRAMUSCULAR | Status: DC | PRN
  Administered 2011-06-21 (×5): 1 mg via INTRAVENOUS
  Filled 2011-06-21 (×5): qty 1

## 2011-06-21 MED ORDER — IOHEXOL 300 MG/ML  SOLN
100.0000 mL | Freq: Once | INTRAMUSCULAR | Status: AC | PRN
  Administered 2011-06-21: 100 mL via INTRAVENOUS

## 2011-06-21 MED ORDER — ONDANSETRON HCL 4 MG/2ML IJ SOLN
4.0000 mg | INTRAMUSCULAR | Status: DC | PRN
  Administered 2011-06-21 – 2011-06-23 (×3): 4 mg via INTRAVENOUS
  Filled 2011-06-21 (×4): qty 2

## 2011-06-21 MED ORDER — FUROSEMIDE 20 MG PO TABS: 20.0000 mg | ORAL_TABLET | Freq: Every day | ORAL | Status: DC

## 2011-06-21 MED ORDER — FOLIC ACID 1 MG PO TABS: 1.0000 mg | ORAL_TABLET | Freq: Every day | ORAL | Status: DC

## 2011-06-21 MED ORDER — HYDROXYUREA 500 MG PO CAPS
1000.0000 mg | ORAL_CAPSULE | ORAL | Status: DC
  Administered 2011-06-21 – 2011-06-27 (×7): 1000 mg via ORAL
  Filled 2011-06-21 (×10): qty 2

## 2011-06-21 MED ORDER — IOHEXOL 300 MG/ML  SOLN
40.0000 mL | Freq: Once | INTRAMUSCULAR | Status: AC | PRN
  Administered 2011-06-21: 40 mL via ORAL

## 2011-06-21 NOTE — H&P (Signed)
Family Medicine [facility] Admission History and Physical  Patient name: [name] Medical record number: [id number] Date of birth: 03-27-1991 Age: 21 y.o. Gender: female  Primary Care Provider: [name], MD, MD  Chief Complaint: Abdominal and back pain  History of Present Illness: [name] is a 21 y.o. year old female presenting with  Approximately 16 hrs of left sided abdominal and back pain consistent w/ her typical pain crisis pain. Pain has not been relieved by home Oxycodone. Pain is fairly constant but getting worse. Pt reports 2 bouts of emesis. Pt reports being compliant w/ home regimen of Folate and hydroxyuria, but has started exercsing more over the past 2 wks. She denies any recent illnesses and felt like she had been doing better overall. Pt feels that her trigger for this episode was trying to do too much w/ regards to home life, school demands, and recent exercise. Pt states that she has been well hydrated as of late and has eaten well. Has not smoked marijuana or tobacco since Christmas. Last appointment with Hematologist was on 05/30/11.   ED Course: Received Dilauded 1mg  x5 and 1L NS bolus in ED w/o improvement in condition.   Review Of Systems: Per HPI with the following additions:  Negative: CP, SOB, lightheadedness, syncope, palpitations, fever, rash, diarrhea, hematuria, hematemesis, hematochezia, dysuria Positive: Nausea, vomiting, constipation Otherwise 12 point review of systems was performed and was unremarkable.  Patient Active Problem List  Diagnoses  . SICKLE CELL ANEMIA  . TRICHOTILLOMANIA  . Active smoker  . Back pain  . Depression  . GERD (gastroesophageal reflux disease)  . Contraception management  . Stress  . Overweight   Past Medical History: Past Medical History  Diagnosis Date  . H/O: 1 miscarriage 03/22/2011  . 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 Active smoker 08/09/2010  . Depression 01/06/2011  . GERD  (gastroesophageal reflux disease) 02/17/2011  . Trichotillomania     h/o  . Blood transfusion   . Asthma   . Sickle cell anemia with crisis     Past Surgical History: Past Surgical History  Procedure Date  . Cholecystectomy 05/2010  . Dilation and curettage of uterus 02/20/11    Social History: History   Social History  . Marital Status: Single    Spouse Name: N/A    Number of Children: N/A  . Years of Education: N/A   Social History Main Topics  . Smoking status: Former Games developer  . Smokeless tobacco: Never Used  . Alcohol Use: No  . Drug Use: No     marijuana  . Sexually Active: Yes    Birth Control/ Protection: Injection   Other Topics Concern  . None   Social History Narrative  . None    Family History: No family history on file.  Allergies: Allergies  Allergen Reactions  . Toradol Hives and Itching    Some itching, give with benedryl    Current Facility-Administered Medications  Medication Dose Route Frequency Provider Last Rate Last Dose  . HYDROmorphone (DILAUDID) injection 1 mg  1 mg Intravenous Once Loren Racer, MD   1 mg at 06/20/11 2018  . HYDROmorphone (DILAUDID) injection 1 mg  1 mg Intravenous Once Loren Racer, MD   1 mg at 06/20/11 2116  . HYDROmorphone (DILAUDID) injection 1 mg  1 mg Intravenous STAT Loren Racer, MD   1 mg at 06/20/11 2241  . HYDROmorphone (DILAUDID) injection 1 mg  1 mg Intravenous Once Doug Sou, MD   1 mg  at 06/20/11 2333  . HYDROmorphone (DILAUDID) injection 1 mg  1 mg Intravenous Once Doug Sou, MD   1 mg at 06/21/11 0055  . iohexol (OMNIPAQUE) 300 MG/ML solution 100 mL  100 mL Intravenous Once PRN Medication Radiologist, MD   100 mL at 06/21/11 0114  . iohexol (OMNIPAQUE) 300 MG/ML solution 40 mL  40 mL Oral Once PRN Medication Radiologist, MD   40 mL at 06/21/11 0113  . ondansetron (ZOFRAN) injection 4 mg  4 mg Intravenous Once Loren Racer, MD   4 mg at 06/20/11 2018  . ondansetron (ZOFRAN)  injection 4 mg  4 mg Intravenous Once Loren Racer, MD   4 mg at 06/20/11 2116  . sodium chloride 0.9 % bolus 1,000 mL  1,000 mL Intravenous Once Loren Racer, MD   1,000 mL at 06/20/11 2017   Current Outpatient Prescriptions  Medication Sig Dispense Refill  . folic acid (FOLVITE) 1 MG tablet Take 1 mg by mouth daily.      . hydroxyurea (HYDREA) 500 MG capsule Take 1,000 mg by mouth every morning. May take with food to minimize GI side effects.      . Oxycodone HCl 10 MG TABS Take 10 mg by mouth every 6 (six) hours as needed. For pain      . Prenatal Vit-Fe Fumarate-FA (PRENATAL MULTIVITAMIN) 60-1 MG tablet Take 1 tablet by mouth every morning.      . medroxyPROGESTERone (DEPO-PROVERA) 150 MG/ML injection Inject 150 mg into the muscle every 3 (three) months. Last dose 04/20/11         Physical Exam: BP 105/70  Pulse 98  Temp(Src) 98.2 F (36.8 C) (Oral)  Resp 16  Ht 5\' 3"  (1.6 m)  Wt 175 lb (79.379 kg)  BMI 31.00 kg/m2  SpO2 95%  LMP 05/05/2011  General: alert, cooperative and appears stated age HEENT: PERRLA, extra ocular movement intact, sclera clear, anicteric, neck supple with midline trachea, thyroid without masses and trachea midline, patches of alopecia consistent w/ hair pulling behavior.  Heart: S1, S2 normal, no murmur, rub or gallop, regular rate and rhythm Lungs: clear to auscultation, no wheezes or rales and unlabored breathing Abdomen: Normal active bowel sounds. Pain on deep palpation of the left flank, though significantly diminished when pt is distracted. No rebound. No guarding. Abdominal scars noted from prior laproscopic surgery.  Extremities: extremities normal, atraumatic, no cyanosis or edema, 2+ peripheral pulses Musculoskeletal: no joint tenderness, deformity or swelling, no focal tenderness. Pain on palpation soft tissue of mid to upper central back area. No CVA tenderness Skin:no rashes, no ecchymoses, no petechiae, no nodules, no jaundice, no purpura,  no wounds Neurology: normal without focal findings, mental status, speech normal, alert and oriented x3 and PERLA  Labs and Imaging: Results for orders placed during the hospital encounter of 06/20/11 (from the past 24 hour(s))  CBC     Status: Abnormal   Collection Time   06/20/11  8:48 PM      Component Value Range   WBC 13.5 (*) 4.0 - 10.5 (K/uL)   RBC 3.04 (*) 3.87 - 5.11 (MIL/uL)   Hemoglobin 9.8 (*) 12.0 - 15.0 (g/dL)   HCT 16.1 (*) 09.6 - 46.0 (%)   MCV 90.5  78.0 - 100.0 (fL)   MCH 32.2  26.0 - 34.0 (pg)   MCHC 35.6  30.0 - 36.0 (g/dL)   RDW 04.5 (*) 40.9 - 15.5 (%)   Platelets 514 (*) 150 - 400 (K/uL)  DIFFERENTIAL  Status: Abnormal   Collection Time   06/20/11  8:48 PM      Component Value Range   Neutrophils Relative 67  43 - 77 (%)   Neutro Abs 9.0 (*) 1.7 - 7.7 (K/uL)   Lymphocytes Relative 23  12 - 46 (%)   Lymphs Abs 3.1  0.7 - 4.0 (K/uL)   Monocytes Relative 8  3 - 12 (%)   Monocytes Absolute 1.1 (*) 0.1 - 1.0 (K/uL)   Eosinophils Relative 2  0 - 5 (%)   Eosinophils Absolute 0.2  0.0 - 0.7 (K/uL)   Basophils Relative 1  0 - 1 (%)   Basophils Absolute 0.1  0.0 - 0.1 (K/uL)  COMPREHENSIVE METABOLIC PANEL     Status: Abnormal   Collection Time   06/20/11  8:48 PM      Component Value Range   Sodium 139  135 - 145 (mEq/L)   Potassium 4.0  3.5 - 5.1 (mEq/L)   Chloride 105  96 - 112 (mEq/L)   CO2 22  19 - 32 (mEq/L)   Glucose, Bld 84  70 - 99 (mg/dL)   BUN 7  6 - 23 (mg/dL)   Creatinine, Ser 1.61  0.50 - 1.10 (mg/dL)   Calcium 9.5  8.4 - 09.6 (mg/dL)   Total Protein 7.8  6.0 - 8.3 (g/dL)   Albumin 4.5  3.5 - 5.2 (g/dL)   AST 27  0 - 37 (U/L)   ALT 20  0 - 35 (U/L)   Alkaline Phosphatase 53  39 - 117 (U/L)   Total Bilirubin 2.1 (*) 0.3 - 1.2 (mg/dL)   GFR calc non Af Amer >90  >90 (mL/min)   GFR calc Af Amer >90  >90 (mL/min)  LIPASE, BLOOD     Status: Normal   Collection Time   06/20/11  8:48 PM      Component Value Range   Lipase 33  11 - 59 (U/L)    RETICULOCYTES     Status: Abnormal   Collection Time   06/20/11  8:48 PM      Component Value Range   Retic Ct Pct 11.1 (*) 0.4 - 3.1 (%)   RBC. 3.04 (*) 3.87 - 5.11 (MIL/uL)   Retic Count, Manual 337.4 (*) 19.0 - 186.0 (K/uL)  URINALYSIS, ROUTINE W REFLEX MICROSCOPIC     Status: Normal   Collection Time   06/21/11 12:09 AM      Component Value Range   Color, Urine YELLOW  YELLOW    APPearance CLEAR  CLEAR    Specific Gravity, Urine 1.013  1.005 - 1.030    pH 5.5  5.0 - 8.0    Glucose, UA NEGATIVE  NEGATIVE (mg/dL)   Hgb urine dipstick NEGATIVE  NEGATIVE    Bilirubin Urine NEGATIVE  NEGATIVE    Ketones, ur NEGATIVE  NEGATIVE (mg/dL)   Protein, ur NEGATIVE  NEGATIVE (mg/dL)   Urobilinogen, UA 0.2  0.0 - 1.0 (mg/dL)   Nitrite NEGATIVE  NEGATIVE    Leukocytes, UA NEGATIVE  NEGATIVE   PREGNANCY, URINE     Status: Normal   Collection Time   06/21/11 12:09 AM      Component Value Range   Preg Test, Ur NEGATIVE  NEGATIVE    CT: Lobular splenic contour may reflect early changes of auto- infarction.  Osseous changes of sickle cell disease.  UPreg: Negative  AXR/CXR: 1. Fluid levels in dilated central small bowel which may represent an early adynamic ileus  or early/partial small bowel obstruction. Consider follow-up films or CT if symptoms persist.  2. No acute cardiopulmonary disease.  3. No free air.   Assessment and Plan: SALIMAH MARTINOVICH is a 21 y.o. year old female with sickle cell disease well known to the practice presenting with typical sickle cell pain including her back and L flank/abdomen.   1. Sickle Cell Pain Crisis: Likely pain crisis w/ some psychosomatic component. Pt hospitalized for similar complaints on 05/31/11. Has received recent workup for possible muscle spasm w/o relief (trigger point injection and muscle relaxer). 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 CBC reassuring of no active sequestration. No sign of  acute chest.  - Retic count - Dilauded 1mg  Q2 PRN, w/ plan to wean to PO pain Rx later today as tolerated - Tylenol 650 Q4 PRN  - Benadryl PRN for itching - Folic Acid - hydroxyurea 1000mg  Qam - Multivitamin - Incentive spirometry - F/u CBC in am - Methocarbamol for spasm - will consider repeat CXR if respiratory status deteriorates   2. 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 Hair was  - Will defer to PCP for follow-up as chronic condition that will take prolonged intervention.   3. FEN/GI: Taking good PO at home prior to admission. Received 1L bolus of NS in ED. Complaining of hard infrequent stools.  - Regular Diet - zofran PRN - IVF SL - miralax  3. Prophylaxis:  - Heparin  4. Disposition: Pending clinical improvement    Signed: MERRELL, DAVID, M.D. Family Medicine Resident PGY-1 06/21/2011 3:00 AM   R3 addendum Agree with the above detailed notes by Dr. Margot Ables. 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 Patient is having her regular pain crisis with pain in her midthoracic back as well as legs. Patient was attempting to control at home with her oxycodone but to no avail patient states that she was a little more stressed out than usual with school in balancing some life issues. Patient is very motivated I potentially getting this under control and going home in the near future.  Please see above for past medical surgical social history as well as labs. Patient is at her baseline hemoglobin which is usually between 9-10.   General: alert, cooperative and appears stated age Heart: S1, S2 normal, no murmur, rub or gallop, regular rate and rhythm Lungs: clear to auscultation, no wheezes or rales and unlabored breathing Abdomen: Normal active bowel sounds. Pain on deep palpation of the left flank, though significantly diminished when pt is distracted. No rebound. No  guarding. Abdominal scars noted from prior laproscopic surgery.  Extremities: extremities normal, atraumatic, no cyanosis or edema, 2+ peripheral pulses Musculoskeletal: no joint tenderness, deformity or swelling, no focal tenderness. Pain on palpation soft tissue of mid to upper central back area. No CVA tenderness Neurology: normal without focal findings, mental status, speech normal, alert and oriented x3  Assessment and Plan: ODENA MCQUAID is a 21 y.o. year old female with sickle cell disease well known to the practice presenting with typical sickle cell pain including her back and L flank/abdomen.   1. Sickle Cell Pain Crisis:. Has received recent workup for possible muscle spasm w/o relief (trigger point injection and muscle relaxer). 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 CBC reassuring of no active sequestration. The  reticulocyte count is appropriate.  No sign of acute chest.  Continue current pain medications including Dilaudid continue home medications that include hydroxyurea will attempt to transition to oral pain medications as soon as patient can tolerate.   2. Trichotillomania: Can be followed up outpatient..   3. FEN/GI: Taking good PO at home prior to admission. Received 1L bolus of NS in ED. Complaining of hard infrequent stools. Continue IV fluids for pain crisis as well as give a bowel regimen.   3. Prophylaxis:  - Heparin  4. Disposition: Pending clinical improvement

## 2011-06-21 NOTE — H&P (Signed)
I have seen and examined this patient. I have discussed with Dr(s) Merrell and Z. Smith .  I agree with their findings and plans as documented in their admission notes for today.

## 2011-06-21 NOTE — ED Notes (Addendum)
Pt to restroom. Still unable to provide urine specimen. Pt informed and gave consent for in and out cath.

## 2011-06-22 ENCOUNTER — Inpatient Hospital Stay (HOSPITAL_COMMUNITY): Payer: Medicaid Other

## 2011-06-22 LAB — RETICULOCYTES: Retic Count, Absolute: 329.4 10*3/uL — ABNORMAL HIGH (ref 19.0–186.0)

## 2011-06-22 MED ORDER — ACETAMINOPHEN 325 MG PO TABS
650.0000 mg | ORAL_TABLET | Freq: Four times a day (QID) | ORAL | Status: DC
  Administered 2011-06-22 – 2011-06-27 (×16): 650 mg via ORAL
  Filled 2011-06-22 (×4): qty 2

## 2011-06-22 MED ORDER — HYDROMORPHONE HCL PF 1 MG/ML IJ SOLN
0.5000 mg | Freq: Once | INTRAMUSCULAR | Status: AC
  Administered 2011-06-22: 0.5 mg via INTRAVENOUS
  Filled 2011-06-22: qty 1

## 2011-06-22 MED ORDER — HYDROMORPHONE HCL PF 1 MG/ML IJ SOLN
0.5000 mg | INTRAMUSCULAR | Status: AC
  Filled 2011-06-22: qty 1

## 2011-06-22 MED ORDER — PANTOPRAZOLE SODIUM 40 MG PO TBEC
40.0000 mg | DELAYED_RELEASE_TABLET | Freq: Every day | ORAL | Status: DC
  Administered 2011-06-23 – 2011-06-26 (×4): 40 mg via ORAL
  Filled 2011-06-22 (×4): qty 1

## 2011-06-22 MED ORDER — HYDROMORPHONE HCL PF 1 MG/ML IJ SOLN
1.0000 mg | INTRAMUSCULAR | Status: DC | PRN
  Administered 2011-06-22 – 2011-06-24 (×15): 1 mg via INTRAVENOUS
  Filled 2011-06-22 (×15): qty 1

## 2011-06-22 NOTE — Progress Notes (Signed)
FMTS Attending Note  Patient seen and examined by me today, discussed with Dr. Gwenlyn Saran and I agree with her assessment and plan.  Suspect patient's epigastric pain is GERD-related.  Will treat accordingly, as well as treatment for paraspinous muscle spasms on the R lower thoracic/upper lumbar area.  Lab workup does not indicate underlying infectious precipitating cause of her vaso-occlusive crisis.  Paula Compton, M.D.

## 2011-06-22 NOTE — Progress Notes (Signed)
Subjective: Pain in lower back and upper right back. Required extra dose of dilaudid 0.5mg  overnight. While in the room, patient started having sharp abdominal pain, supraumbilical in nature, worst with deep breath. Has not had a bowel movement in 3 days. Passing gas. This pain is similar in nature to what she was experiencing when she first presented.   Objective: Vital signs in last 24 hours: Temp:  [98.6 F (37 C)-99.1 F (37.3 C)] 98.7 F (37.1 C) (02/06 0644) Pulse Rate:  [82-107] 82  (02/06 0644) Resp:  [18-20] 19  (02/06 0644) BP: (90-101)/(54-61) 98/61 mmHg (02/06 0644) SpO2:  [94 %-98 %] 97 % (02/06 0644) Weight change:  Last BM Date: 06/19/11  Intake/Output from previous day: 02/05 0701 - 02/06 0700 In: 3493.8 [P.O.:600; I.V.:2893.8] Out: 1050 [Urine:1050] Intake/Output this shift:    General appearance: alert, cooperative and moderate distress Back: tenderness to palpation in lower back. Tenderness to palpation in right upper aspect, with muscle spasms and hypertrophy noted. Resp: clear to auscultation bilaterally Cardio: S1S2, soft systolic murmur, RRR GI: soft, tender to palpation supraumbilically around scar, no rebound, no guarding, present bowel sounds  Lab Results:  Basename 06/21/11 0715 06/20/11 2048  WBC 13.1* 13.5*  HGB 9.0* 9.8*  HCT 25.5* 27.5*  PLT 541* 514*   BMET  Basename 06/21/11 0715 06/20/11 2048  NA -- 139  K -- 4.0  CL -- 105  CO2 -- 22  GLUCOSE -- 84  BUN -- 7  CREATININE 0.55 0.58  CALCIUM -- 9.5    Studies/Results: Ct Abdomen Pelvis W Contrast  06/21/2011  *RADIOLOGY REPORT*  Clinical Data: Left-sided abdominal pain.  CT ABDOMEN AND PELVIS WITH CONTRAST  Technique:  Multidetector CT imaging of the abdomen and pelvis was performed following the standard protocol during bolus administration of intravenous contrast.  Contrast: 40mL OMNIPAQUE IOHEXOL 300 MG/ML IV SOLN, OMNIPAQUE IOHEXOL 300 MG/ML IV SOLN  Comparison: 06/20/2011  radiograph  Findings: Limited images through the lung bases demonstrate no significant appreciable abnormality. The heart size is within normal limits. No pleural or pericardial effusion.  Unremarkable liver, pancreas, adrenal glands.  Status post cholecystectomy.  No biliary ductal dilatation.  Lobular splenic contour.  Symmetric renal enhancement.  No hydronephrosis or hydroureter.  No bowel obstruction.  No CT evidence for colitis.  Normal appendix.  Normal terminal ileum.  Subcentimeter ileocolic lymph nodes.  Circumferential bladder wall thickening is nonspecific given incomplete distension.  CT appearance to the uterus and adnexa within normal limits.  Circumaortic left renal vein.  Otherwise, normal caliber vasculature.  H shaped vertebrae with areas of sclerosis is in keeping with sequelae of sickle cell disease. 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  Osseous changes of sickle cell disease.  Original Report Authenticated By: Waneta Martins, M.D.   Dg Knee Complete 4 Views Right  06/21/2011  *RADIOLOGY REPORT*  Clinical Data: Pain post fall.  RIGHT KNEE - COMPLETE 4+ VIEW  Comparison: 03/15/2011  Findings: No effusion.  Subtle sclerosis in the proximal tibial diaphysis is stable. Negative for fracture, dislocation, or other acute abnormality.  Normal alignment and mineralization. No significant degenerative change.  Regional soft tissues unremarkable.  IMPRESSION:  Negative  Original Report Authenticated By: Osa Craver, M.D.   Dg Abd Acute W/chest  06/20/2011  *RADIOLOGY REPORT*  Clinical Data: Abdominal pain  ACUTE ABDOMEN SERIES (ABDOMEN 2  VIEW & CHEST 1 VIEW)  Comparison: 05/08/2012 and earlier studies  Findings: Stable linear scarring or subsegmental atelectasis in the right upper lobe.  Lungs otherwise clear.  Heart size normal.  No  effusion.  No free air.  Vascular clips in the right upper abdomen.  A few gas filled nondilated mid abdominal small bowel loops, with scattered fluid levels on the erect radiograph.  Moderate fecal material in the proximal colon, decompressed distally.  No abnormal abdominal calcifications.  Regional bones unremarkable.  IMPRESSION:  1.  Fluid levels in dilated central small bowel which may represent an early adynamic ileus or early/partial small bowel obstruction. Consider follow-up films or CT if symptoms persist. 2.  No acute cardiopulmonary disease. 3.  No free air.  Original Report Authenticated By: Osa Craver, M.D.    Medications:  Scheduled:   . folic acid  1 mg Oral Daily  . heparin  5,000 Units Subcutaneous Q8H  .  HYDROmorphone (DILAUDID) injection  0.5 mg Intravenous Once  .  HYDROmorphone (DILAUDID) injection  0.5 mg Intravenous NOW  . hydroxyurea  1,000 mg Oral BH-q7a  . pantoprazole  40 mg Oral Q1200  . polyethylene glycol  17 g Oral Daily  . prenatal vitamin w/FE, FA  1 tablet Oral BH-q7a   Continuous:   . sodium chloride 125 mL/hr at 06/22/11 0700    Assessment/Plan: [name] is a 21 y.o. year old female with sickle cell disease well known to the practice presenting with typical sickle cell pain including her back and L flank/abdomen. Continues to have abdominal pain.  1. Sickle Cell Pain Crisis: - on dilaudid 0.5 mg q2hr: still having some pain, consider switching to oral percocet 10mg  later today if pain controled - anemia: stable at 9.0 from 9.1 on admission, follow up retic count - no sign of acute chest - continue hydroxyurea 2. Abdominal pain: CT scan showed lobular splenic contour, unchanged from previous ct.  - will repeat KUB to rule out any acute process: obstruction vs ileus  - patient has been constipated for 3 days: now on miralax 3. FEN/GI:   Continue IV fluids for pain crisis as well as give a bowel regimen.  3. Prophylaxis:  -  Heparin  4. Disposition: Pending clinical improvement     LOS: 2 days   Fred Franzen 06/22/2011, 10:14 AM

## 2011-06-22 NOTE — Progress Notes (Signed)
PCP note: I went by to see patient this morning.  Pain well controlled at this time.  Pt to transition to po pain meds as outlined in plan by inpatient team.  I also agree that being more intentional in the outpatient setting about placing pt on a bowel regimen with her chronic narcotic use is very important.  I appreciate the help of the inpatient team and will be sure to follow up on these issues when I see [name] for her next outpatient appt.  Barbera Perritt S. Edmonia James, MD PGY-3

## 2011-06-23 LAB — DIFFERENTIAL
Eosinophils Relative: 5 % (ref 0–5)
Lymphocytes Relative: 19 % (ref 12–46)
Monocytes Absolute: 1.2 10*3/uL — ABNORMAL HIGH (ref 0.1–1.0)
Monocytes Relative: 10 % (ref 3–12)
Neutro Abs: 7.9 10*3/uL — ABNORMAL HIGH (ref 1.7–7.7)

## 2011-06-23 LAB — CBC
HCT: 24.5 % — ABNORMAL LOW (ref 36.0–46.0)
Hemoglobin: 8.8 g/dL — ABNORMAL LOW (ref 12.0–15.0)
MCHC: 35.9 g/dL (ref 30.0–36.0)
MCV: 90.7 fL (ref 78.0–100.0)
RDW: 18.1 % — ABNORMAL HIGH (ref 11.5–15.5)

## 2011-06-23 LAB — RETICULOCYTES: Retic Count, Absolute: 356.4 10*3/uL — ABNORMAL HIGH (ref 19.0–186.0)

## 2011-06-23 MED ORDER — POLYETHYLENE GLYCOL 3350 17 G PO PACK
17.0000 g | PACK | Freq: Two times a day (BID) | ORAL | Status: DC
  Administered 2011-06-23 – 2011-06-26 (×5): 17 g via ORAL
  Filled 2011-06-23 (×9): qty 1

## 2011-06-23 MED ORDER — SENNA 8.6 MG PO TABS
2.0000 | ORAL_TABLET | Freq: Every day | ORAL | Status: DC
  Administered 2011-06-24 – 2011-06-27 (×4): 17.2 mg via ORAL
  Filled 2011-06-23 (×5): qty 2

## 2011-06-23 NOTE — Progress Notes (Signed)
Subjective: [name] increased to 1mg  q2hr yesterday given that patient was not feeling much relief with 0.5mg . Had episode of nausea this morning with dry heaving and retching productive of mucus with blood tinged streak. Also having abdominal pain similar in nature to yesterday. No bowel movement in 4 days. Passing gas.   Objective: Vital signs in last 24 hours: Temp:  [98.1 F (36.7 C)-99.1 F (37.3 C)] 98.1 F (36.7 C) (02/07 0800) Pulse Rate:  [86-104] 88  (02/07 0800) Resp:  [16-20] 16  (02/07 0800) BP: (90-101)/(63-73) 90/73 mmHg (02/07 0700) SpO2:  [96 %-98 %] 98 % (02/07 0800) Weight change:  Last BM Date: 06/19/11  Intake/Output from previous day: June 30, 2022 0701 - 02/07 0700 In: 1980 [P.O.:480; I.V.:1500] Out: -  Intake/Output this shift:    General appearance: alert, cooperative and mild distress due to pain. Back: tenderness to palpation in lower back. Muscle spasm and muscle hypertrophy along right paraspinal muscle, pain relieved with light massage along the area.  Resp: clear to auscultation bilaterally Cardio: S1S2, soft systolic murmur, RRR GI: soft, tender to palpation supraumbilically, no rebound, no guarding, present bowel sounds  Lab Results: CBC with retic: pending    Basename 06/21/11 0715 06/20/11 2048  WBC 13.1* 13.5*  HGB 9.0* 9.8*  HCT 25.5* 27.5*  PLT 541* 514*   BMET  Basename 06/21/11 0715 06/20/11 2048  NA -- 139  K -- 4.0  CL -- 105  CO2 -- 22  GLUCOSE -- 84  BUN -- 7  CREATININE 0.55 0.58  CALCIUM -- 9.5    Studies/Results: Dg Abd 2 Views  07-01-11  *RADIOLOGY REPORT*  Clinical Data: Acute abdominal pain  ABDOMEN - 2 VIEW  Comparison: CT abdomen pelvis dated 06/21/2011  Findings: Nonobstructive bowel gas pattern.  Residual contrast in the colon.  No evidence of free air under the diaphragm on the upright view.  Cholecystectomy clips.  Vertebral endplate changes related to sickle cell disease.  IMPRESSION: No evidence of small bowel  obstruction or free air.  Original Report Authenticated By: Charline Bills, M.D.    Medications:  Scheduled:    . acetaminophen  650 mg Oral Q6H  . folic acid  1 mg Oral Daily  . heparin  5,000 Units Subcutaneous Q8H  .  HYDROmorphone ([name]) injection  0.5 mg Intravenous NOW  . hydroxyurea  1,000 mg Oral BH-q7a  . pantoprazole  40 mg Oral Q1200  . polyethylene glycol  17 g Oral Daily  . prenatal vitamin w/FE, FA  1 tablet Oral BH-q7a   Continuous:    . sodium chloride 125 mL/hr at 06/23/11 0522    Assessment/Plan: [name] is a 21 y.o. 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 Also having abdominal pain.  1. Sickle Cell Pain Crisis: - most of the right sided back pain appears to be due to a muscle spasms. Encouraged warm compresses and light massage.  - lower back pain typical for her pain crisis, still present - on [name] 1 mg q2hr: still having some pain, consider switching to oral percocet 10mg  later today if pain controled - anemia: follow up today's CBC - no sign of acute chest - continue hydroxyurea 2. Abdominal pain: GERD vs constipation. Patient has not had bowel movement in 4 days and is on chronic narcotics. Nausea may be due to constipation. Episode of blood tinged sputum appears to be most likely due to  irritation with coughing or retching and does not appear to be due a GI bleed.  - repeat KUB yesterday did not show any evidence of obstruction or free air.  - will increase miralax to twice daily and consider an enema - CT scan showed lobular splenic contour, unchanged from previous CT  3. FEN/GI:   Continue IV fluids for pain crisis as well as give a bowel regimen.  3. Prophylaxis:  - Heparin  4. Disposition: Pending clinical improvement     LOS: 3 days   Tanea Moga 06/23/2011, 9:19 AM

## 2011-06-24 LAB — COMPREHENSIVE METABOLIC PANEL
Alkaline Phosphatase: 61 U/L (ref 39–117)
BUN: 6 mg/dL (ref 6–23)
Chloride: 104 mEq/L (ref 96–112)
Creatinine, Ser: 0.56 mg/dL (ref 0.50–1.10)
GFR calc Af Amer: >90 mL/min (ref >90)
Glucose, Bld: 106 mg/dL — ABNORMAL HIGH (ref 70–99)
Potassium: 3.9 mEq/L (ref 3.5–5.1)
Total Bilirubin: 1.5 mg/dL — ABNORMAL HIGH (ref 0.3–1.2)

## 2011-06-24 LAB — DIFFERENTIAL
Eosinophils Relative: 7 % — ABNORMAL HIGH (ref 0–5)
Lymphocytes Relative: 33 % (ref 12–46)
Lymphs Abs: 3.7 10*3/uL (ref 0.7–4.0)
Monocytes Absolute: 1.1 10*3/uL — ABNORMAL HIGH (ref 0.1–1.0)
Monocytes Relative: 10 % (ref 3–12)
Neutro Abs: 5.5 10*3/uL (ref 1.7–7.7)

## 2011-06-24 LAB — CBC
HCT: 24.6 % — ABNORMAL LOW (ref 36.0–46.0)
Hemoglobin: 8.7 g/dL — ABNORMAL LOW (ref 12.0–15.0)
MCV: 90.4 fL (ref 78.0–100.0)
WBC: 11.2 10*3/uL — ABNORMAL HIGH (ref 4.0–10.5)

## 2011-06-24 LAB — RETICULOCYTES: Retic Ct Pct: 14.2 % — ABNORMAL HIGH (ref 0.4–3.1)

## 2011-06-24 MED ORDER — GLYCERIN (LAXATIVE) 2.1 G RE SUPP
1.0000 | Freq: Once | RECTAL | Status: DC
  Filled 2011-06-24: qty 1

## 2011-06-24 MED ORDER — HYDROMORPHONE HCL PF 1 MG/ML IJ SOLN
1.0000 mg | INTRAMUSCULAR | Status: DC | PRN
  Administered 2011-06-24 (×3): 1 mg via INTRAVENOUS
  Filled 2011-06-24 (×3): qty 1

## 2011-06-24 MED ORDER — GLYCERIN (LAXATIVE) 2.1 G RE SUPP
1.0000 | Freq: Once | RECTAL | Status: AC
  Administered 2011-06-24: 1 via RECTAL
  Filled 2011-06-24: qty 1

## 2011-06-24 MED ORDER — HYDROMORPHONE HCL PF 1 MG/ML IJ SOLN
1.0000 mg | INTRAMUSCULAR | Status: DC | PRN
  Administered 2011-06-24 – 2011-06-26 (×15): 1 mg via INTRAVENOUS
  Filled 2011-06-24 (×15): qty 1

## 2011-06-24 NOTE — Progress Notes (Signed)
PGY-1 Daily Progress Note Family Medicine Teaching Service [name] M. Leandro Berkowitz, MD Service Pager: [phone]  Subjective: Patient states she had a bad night. Her back pain is still present and has not improved much. Her abdominal pain comes and goes. She has not had a bowel movement yet.   Objective: Vital signs in last 24 hours: Temp:  [98.3 F (36.8 C)-99 F (37.2 C)] 98.3 F (36.8 C) (02/08 0510) Pulse Rate:  [84-94] 94  (02/08 0510) Resp:  [16-18] 18  (02/08 0510) BP: (103-106)/(45-59) 103/54 mmHg (02/08 0510) SpO2:  [98 %-100 %] 98 % (02/08 0510) Weight change:  Last BM Date: 06/19/11  Intake/Output from previous day: 02/07 0701 - 02/08 0700 In: 3079.2 [I.V.:3079.2] Out: -  Intake/Output this shift:   General appearance: Talkative, no acute distress when sitting still. Obvious discomfort with movement Back: tenderness to palpation in lower back over spinal processes. Increased tenderness along paraspinal muscles in right lumbar spine, as well as right shoulder blade Resp: clear to auscultation bilaterally Cardio: soft systolic murmur, RRR GI: soft, tender to palpation diffusely, +bowel sounds  Lab Results:  Basename 06/24/11 0610 06/23/11 0859  WBC 11.2* 12.0*  HGB 8.7* 8.8*  HCT 24.6* 24.5*  PLT 497* 507*  Retic 14.2%  BMET  Basename 06/24/11 0610  NA 139  K 3.9  CL 104  CO2 22  GLUCOSE 106*  BUN 6  CREATININE 0.56  CALCIUM 9.1    Studies/Results: Dg Abd 2 Views  07/11/11  *RADIOLOGY REPORT*  Clinical Data: Acute abdominal pain  ABDOMEN - 2 VIEW  Comparison: CT abdomen pelvis dated 06/21/2011  Findings: Nonobstructive bowel gas pattern.  Residual contrast in the colon.  No evidence of free air under the diaphragm on the upright view.  Cholecystectomy clips.  Vertebral endplate changes related to sickle cell disease.  IMPRESSION: No evidence of small bowel obstruction or free air.  Original Report Authenticated By: Charline Bills, M.D.    Medications:    Scheduled:    . acetaminophen  650 mg Oral Q6H  . folic acid  1 mg Oral Daily  . heparin  5,000 Units Subcutaneous Q8H  .  HYDROmorphone (DILAUDID) injection  0.5 mg Intravenous NOW  . hydroxyurea  1,000 mg Oral BH-q7a  . pantoprazole  40 mg Oral Q1200  . polyethylene glycol  17 g Oral BID  . prenatal vitamin w/FE, FA  1 tablet Oral BH-q7a  . senna  2 tablet Oral Daily   Continuous:    . sodium chloride 125 mL/hr at 06/24/11 4540   Assessment/Plan: [name] is a 21 y.o. 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 Also having abdominal pain.   1. Sickle Cell Pain Crisis: HgB stable, retic 14.2% - Pain crisis of lumbar spine. Unchanged, but Dilaudid helps. - Most of the right sided back pain appears to be due to a muscle spasms. Boyfriend is massaging her back which helps. She is currently using disposable heat packs which help. Will order [facility] for her to use more continuously.  - Patient wants to continue Dilaudid for today. She will switch to PO tomorrow. - Continue hydroxyurea - PT today to help patient ambulate. She agrees with this plan.  2. Abdominal pain: GERD vs constipation. Patient has not had bowel movement in 5 or so days and is on chronic narcotics. She is still having some nausea but she is tolerating PO.  -  KUB does not show any evidence of obstruction or free air.  - Increased miralax to twice daily, and patient received Sennakot. She is interested in a suppository today. Will order glycerin suppository today and continue to monitor for bowel movement. - CT scan showed lobular splenic contour, unchanged from previous CT  3. FEN/GI:   Continue IV fluids for pain crisis as well as give a bowel regimen.   4. Prophylaxis:  - Heparin   5. Disposition: Pending clinical improvement. If she is tolerating PO pain medication and does well with ambulation, I anticipate  discharge home in next 24-48 hours.    LOS: 4 days   Deloyce Walthers 06/24/2011, 9:56 AM

## 2011-06-24 NOTE — Progress Notes (Signed)
Pt was asked to take the suppository for constipation all day today, however she refused each time. Will attempt dose tonight before bed. Arnoldo Morale RN

## 2011-06-24 NOTE — Progress Notes (Signed)
Physical Therapy Evaluation Patient Details Name: [name] MRN: [id number] DOB: 30-Sep-1990 Today's Date: 06/24/2011  Problem List:  Patient Active Problem List  Diagnoses  . SICKLE CELL ANEMIA  . TRICHOTILLOMANIA  . Active smoker  . Back pain  . Depression  . GERD (gastroesophageal reflux disease)  . Contraception management  . Stress  . Overweight    Past Medical History:  Past Medical History  Diagnosis Date  . H/O: 1 miscarriage 03/22/2011  . 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 Active smoker 08/09/2010  . Depression 01/06/2011  . GERD (gastroesophageal reflux disease) 02/17/2011  . Trichotillomania     h/o  . Blood transfusion   . Sickle cell anemia with crisis    Past Surgical History:  Past Surgical History  Procedure Date  . Cholecystectomy 05/2010  . Dilation and curettage of uterus 02/20/11    PT Assessment/Plan/Recommendation PT Assessment Clinical Impression Statement: Patient with sickle cell crisis presenting with R scapular/shoulder pain and R knee pain. Discussed using heating pad and a tennis ball for massage for pain management. Also discussed using a rolling back pack instead of conventional for school to decrease strain on back. patient deferred use of crutch for ambulation. Patient functioning at baseline and would not benefit from further skilled PT at this time. PT Recommendation/Assessment: Patent does not need any further PT services No Skilled PT: All education completed;Patient at baseline level of functioning;Patient is independent with all acitivity/mobility PT Recommendation Follow Up Recommendations: No PT follow up Equipment Recommended:  (none) PT Goals : further skilled PT no needed at this time.    PT Evaluation Precautions/Restrictions  Precautions Precautions: Fall Restrictions Weight Bearing Restrictions: No Prior Functioning  Home Living Lives With: Significant other (roomates) Receives Help From: Friend(s);Family Type of  Home: House Home Layout: One level Home Access: Level entry Bathroom Shower/Tub: Engineer, manufacturing systems: Standard Home Adaptive Equipment:  (none) Prior Function Level of Independence: Independent with basic ADLs;Independent with gait;Independent with transfers Able to Take Stairs?: Yes Driving: Yes Vocation: Part time employment (goes to school full time, works [employer] 15-25 hr/2weeks) Vocation Requirements: standing and carrying heavy backpacks (pt going to get rolling back pack) Cognition Cognition Arousal/Alertness: Awake/alert Overall Cognitive Status: Appears within functional limits for tasks assessed Pain: 8/0 R shoulder, 5/10 R knee  Sensation/Coordination Sensation Light Touch: Appears Intact Extremity Assessment RUE Assessment RUE Assessment: Within Functional Limits LUE Assessment LUE Assessment: Within Functional Limits RLE Assessment RLE Assessment: Within Functional Limits LLE Assessment LLE Assessment: Within Functional Limits Mobility (including Balance) Bed Mobility Bed Mobility: Yes Supine to Sit: 7: Independent (HOB elevated) Transfers Transfers: Yes Sit to Stand: 7: Independent Stand to Sit: 7: Independent Ambulation/Gait Ambulation/Gait: Yes Ambulation/Gait Assistance: 6: Modified independent (Device/Increase time) Ambulation Distance (Feet): 150 Feet Assistive device:  (on/off use of pushing IV pole) Gait Pattern:  (pt with increaesed R knee pain however pt deferred use of crutch to decrease pressure on R knee. Gait velocity: normal Stairs: No Wheelchair Mobility Wheelchair Mobility: No  Posture/Postural Control Posture/Postural Control: No significant limitations Balance Balance Assessed: Yes Static Sitting Balance Static Sitting - Balance Support: No upper extremity supported Static Sitting - Level of Assistance: 7: Independent Static Standing Balance Static Standing - Balance Support: No upper extremity supported Static  Standing - Level of Assistance: 7: Independent Dynamic Standing Balance Dynamic Standing - Balance Support:  (unilateral UE support pending pain) Dynamic Standing - Level of Assistance: 6: Modified independent (Device/Increase time) Exercise : instructed on using tennis ball for massage  and use of heating pad including precautions   End of Session PT - End of Session Equipment Utilized During Treatment: Gait belt Activity Tolerance: Patient tolerated treatment well (overall activity tolerance limited by pain) Patient left: in bed;with call bell in reach;with family/visitor present Nurse Communication: Mobility status for transfers;Mobility status for ambulation General Behavior During Session: [facility] for tasks performed Cognition: [facility] for tasks performed  Marcene Brawn 06/24/2011, 12:26 PM  Lewis Shock, PT, DPT Pager #: [phone] Office #: [phone]

## 2011-06-24 NOTE — Progress Notes (Signed)
FMTS Attending Note  Patient seen by me today, getting up from bed and ambulating independently.  Appears more comfortable than previous days.  Agree with Dr. Algis Downs plan to make transition to oral pain medications tomorrow; bowel regimen to assist with bowel movement.  Anticipate discharge once pain controlled on oral medications.   Paula Compton, MD

## 2011-06-25 LAB — CBC
Platelets: 441 10*3/uL — ABNORMAL HIGH (ref 150–400)
RDW: 18.6 % — ABNORMAL HIGH (ref 11.5–15.5)
WBC: 11.4 10*3/uL — ABNORMAL HIGH (ref 4.0–10.5)

## 2011-06-25 NOTE — Progress Notes (Signed)
Subjective: Was spaced yesterday to dilaudid 61m q3, but patient requested going back to q2hr as pain was not controled. Pain is now improved. Still has right upper back pain, now radiating to her right arm. Also having lower back pain. Abdominal pain is improved and patient had bowel movement yesterday after taking glycerin suppository. Patient expressed interest in transitioning to orals perhaps later this afternoon.   Objective: Vital signs in last 24 hours: Temp:  [98.3 F (36.8 C)-99.1 F (37.3 C)] 98.3 F (36.8 C) (02/09 0608) Pulse Rate:  [81-96] 85  (02/09 0608) Resp:  [16-18] 18  (02/09 0608) BP: (99-103)/(43-64) 103/43 mmHg (02/09 0608) SpO2:  [96 %-99 %] 99 % (02/09 2956) Weight change:  Last BM Date: 06/24/11  Intake/Output from previous day: 02/08 0701 - 02/09 0700 In: 1980 [P.O.:480; I.V.:1500] Out: -  Intake/Output this shift:    General appearance: alert, cooperative and in no distress, eating breakfast Back: tenderness to palpation in lower back. Muscle spasm and muscle hypertrophy along right paraspinal muscle, pain relieved with light massage along the area.  Resp: clear to auscultation bilaterally Cardio: S1S2, soft systolic murmur, RRR GI: soft, non tender, non distended, present bowel sounds  Lab Results:   Basename 06/25/11 0600 06/24/11 0610  WBC 11.4* 11.2*  HGB 8.6* 8.7*  HCT 24.6* 24.6*  PLT 441* 497*   BMET  Basename 06/24/11 0610  NA 139  K 3.9  CL 104  CO2 22  GLUCOSE 106*  BUN 6  CREATININE 0.56  CALCIUM 9.1    Studies/Results: No results found.  Medications:  Scheduled:    . acetaminophen  650 mg Oral Q6H  . folic acid  1 mg Oral Daily  . Glycerin (Adult)  1 suppository Rectal Once  . Glycerin (Adult)  1 suppository Rectal Once  . heparin  5,000 Units Subcutaneous Q8H  . hydroxyurea  1,000 mg Oral BH-q7a  . pantoprazole  40 mg Oral Q1200  . polyethylene glycol  17 g Oral BID  . prenatal vitamin w/FE, FA  1 tablet Oral  BH-q7a  . senna  2 tablet Oral Daily   Continuous:    . sodium chloride 125 mL/hr at 06/24/11 1839    Assessment/Plan: [name] is a 21 y.o. 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  1. Sickle Cell Pain Crisis: - hemoglobin stable at 8.6 today.  - back pain from muscle spasm, relieved with massage and warm compresses.  - lower back pain typical for her pain crisis, still present - continue dilaudid until patient's pain is controled enough to transition to home percocet 10mg   - continue hydroxyurea 2. Abdominal pain: improved after bowel movement, most likely due to constipation.  - continue miralax, sennokot and glycerin suppository as needed - continue protonix for GERD 3. FEN/GI:   Continue IV fluids for pain crisis as well as give a bowel regimen.  3. Prophylaxis:  - Heparin  4. Disposition: Pending pain controled with home oral medication    LOS: 5 days   [name] 06/25/2011, 9:53 AM

## 2011-06-25 NOTE — Progress Notes (Signed)
FMTS Attending NOte  Patient seen and examined by me today, discussed with Dr Gwenlyn Saran and I agree with her assessment and plan.  [name] appears more comfortable today, complains that the R sided paraspinous pain has radiated to R shoulder/elbow.  No recent injury.  Abdominal pain is resolved; had large BM yesterday after glycerin suppository.   Plan to attempt to change over to oral pain meds this afternoon; consideration for possible discharge tomorrow if tolerates well.  She expresses interest in getting discharged when she is ready (clarifies that she would be nervous of recurrent flare-up if discharged too early).  Paula Compton, MD

## 2011-06-26 LAB — CBC
Hemoglobin: 8.5 g/dL — ABNORMAL LOW (ref 12.0–15.0)
RBC: 2.63 MIL/uL — ABNORMAL LOW (ref 3.87–5.11)
WBC: 10.4 10*3/uL (ref 4.0–10.5)

## 2011-06-26 MED ORDER — PREDNISONE 50 MG PO TABS
50.0000 mg | ORAL_TABLET | Freq: Every day | ORAL | Status: DC
  Administered 2011-06-27: 50 mg via ORAL
  Filled 2011-06-26 (×2): qty 1

## 2011-06-26 MED ORDER — MORPHINE SULFATE 2 MG/ML IJ SOLN
2.0000 mg | Freq: Once | INTRAMUSCULAR | Status: AC
  Administered 2011-06-26: 2 mg via INTRAVENOUS
  Filled 2011-06-26: qty 1

## 2011-06-26 MED ORDER — OXYCODONE HCL 5 MG PO TABS
10.0000 mg | ORAL_TABLET | ORAL | Status: DC | PRN
  Administered 2011-06-26 – 2011-06-27 (×4): 10 mg via ORAL
  Administered 2011-06-27: 5 mg via ORAL
  Administered 2011-06-27: 10 mg via ORAL
  Filled 2011-06-26 (×3): qty 2
  Filled 2011-06-26: qty 1
  Filled 2011-06-26 (×2): qty 2

## 2011-06-26 NOTE — Progress Notes (Signed)
FMTS Attending Note   Patient seen and examined today by me, discussed with Dr Gwenlyn Saran and I agree with her assessment and plan.  [name] is reporting some improvement in pain, albeit still with significant R sided neck and shoulder pain.  Is starting muscle relaxant now.  Using tennis ball and massages for pain.  CBC shows stable hgb without leukocytosis; plan to see her improvement on oral pain meds and muscle relaxant, PT and out of bed.  She does not feel ready for discharge yet today.  Paula Compton, MD

## 2011-06-26 NOTE — Progress Notes (Signed)
Subjective: Patient difficult to wake up this morning and was therefore not able to get full report of, or night sweats. Patient did not transition to orals yesterday she was in too much pain to do so. Currently on Dilaudid 1 mg every 2 been receiving it every 2-3 hours last at nighttime.  Objective: Vital signs in last 24 hours: Temp:  [98.3 F (36.8 C)-99 F (37.2 C)] 98.9 F (37.2 C) (02/10 0631) Pulse Rate:  [83-92] 83  (02/10 0631) Resp:  [18-20] 20  (02/10 0631) BP: (84-105)/(50-61) 84/61 mmHg (02/10 0631) SpO2:  [92 %-99 %] 99 % (02/10 0631) Weight change:  Last BM Date: 06/25/11  Intake/Output from previous day: 02/09 0701 - 02/10 0700 In: 2480 [P.O.:480; I.V.:2000] Out: -  Intake/Output this shift:    General appearance: alert, cooperative and in no distress, eating breakfast Back:muscle spasm along right paraspinal muscle still present Resp: clear to auscultation bilaterally Cardio: S1S2, soft systolic murmur, RRR GI: soft, non tender, non distended, present bowel sounds Ext: no edema, no tenderness to palpation Lab Results:   Basename 06/26/11 0600 06/25/11 0600  WBC 10.4 11.4*  HGB 8.5* 8.6*  HCT 24.3* 24.6*  PLT 499* 441*   BMET  Basename 06/24/11 0610  NA 139  K 3.9  CL 104  CO2 22  GLUCOSE 106*  BUN 6  CREATININE 0.56  CALCIUM 9.1    Studies/Results: No results found.  Medications:  Scheduled:    . acetaminophen  650 mg Oral Q6H  . folic acid  1 mg Oral Daily  . Glycerin (Adult)  1 suppository Rectal Once  . heparin  5,000 Units Subcutaneous Q8H  . hydroxyurea  1,000 mg Oral BH-q7a  . pantoprazole  40 mg Oral Q1200  . polyethylene glycol  17 g Oral BID  . prenatal vitamin w/FE, FA  1 tablet Oral BH-q7a  . senna  2 tablet Oral Daily   Continuous:    . sodium chloride 125 mL/hr at 06/25/11 2141    Assessment/Plan: [name] is a 21 y.o. 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  1. Sickle Cell Pain Crisis: - hemoglobin stable at 8.5 today.  - back pain from muscle spasm, relieved with massage and warm compresses.  - lower back pain typical for her pain crisis, still present - plan to transition to orals today - continue hydroxyurea 2. Abdominal pain: improved after bowel movement, most likely due to constipation.  - continue miralax, sennokot and glycerin suppository as needed - continue protonix for GERD 3. FEN/GI:   Continue IV fluids for pain crisis as well as give a bowel regimen.  3. Prophylaxis:  - Heparin  4. Disposition: Pending pain controled with home oral medication    LOS: 6 days   [name] 06/26/2011, 10:17 AM

## 2011-06-27 ENCOUNTER — Encounter: Payer: Self-pay | Admitting: Family Medicine

## 2011-06-27 LAB — DIFFERENTIAL
Eosinophils Relative: 6 % — ABNORMAL HIGH (ref 0–5)
Lymphocytes Relative: 26 % (ref 12–46)
Lymphs Abs: 2.6 10*3/uL (ref 0.7–4.0)
Monocytes Absolute: 1.1 10*3/uL — ABNORMAL HIGH (ref 0.1–1.0)

## 2011-06-27 LAB — CBC
HCT: 24.5 % — ABNORMAL LOW (ref 36.0–46.0)
Hemoglobin: 8.7 g/dL — ABNORMAL LOW (ref 12.0–15.0)
MCV: 91.1 fL (ref 78.0–100.0)
Platelets: 490 10*3/uL — ABNORMAL HIGH (ref 150–400)
RBC: 2.69 MIL/uL — ABNORMAL LOW (ref 3.87–5.11)
WBC: 9.9 10*3/uL (ref 4.0–10.5)

## 2011-06-27 LAB — RETICULOCYTES: Retic Ct Pct: 12.5 % — ABNORMAL HIGH (ref 0.4–3.1)

## 2011-06-27 MED ORDER — PANTOPRAZOLE SODIUM 40 MG PO TBEC: 40.0000 mg | DELAYED_RELEASE_TABLET | Freq: Every day | ORAL | Status: DC

## 2011-06-27 MED ORDER — OXYCODONE HCL 10 MG PO TABS: 10.0000 mg | ORAL_TABLET | ORAL | Status: AC | PRN

## 2011-06-27 MED ORDER — METHOCARBAMOL 750 MG PO TABS: 750.0000 mg | ORAL_TABLET | Freq: Four times a day (QID) | ORAL | Status: AC | PRN

## 2011-06-27 MED ORDER — SENNA 8.6 MG PO TABS: 2.0000 | ORAL_TABLET | Freq: Every day | ORAL | Status: DC

## 2011-06-27 MED ORDER — POLYETHYLENE GLYCOL 3350 17 G PO PACK: 17.0000 g | PACK | Freq: Two times a day (BID) | ORAL | Status: AC

## 2011-06-27 NOTE — Discharge Summary (Signed)
Physician Discharge Summary  Patient ID: [name] MRN: [id number] DOB/AGE: [birth year] 21 y.o.  Admit date: 06/20/2011 Discharge date: 06/27/2011  Admission Diagnoses: 1. Vaso-occlusive sickle cell pain crisis 2. Sickle Cell anemia 3. Abdominal pain Discharge Diagnoses:  1. Pain crisis-resolved 2. Sickle cell anemia 3. Constipation-resolved  Discharged Condition: good  Hospital Course:  21 year old female with history of sickle cell disease who presented with 16hr history of left sided abdominal and back pain consistent with her typical pain crisis pain, not relieved with home oxycodone 10mg . Abodminal pain was also associated with nausea and vomiting.    1. Sickle cell pain crisis:  Patient's pain was located in her lower back, consistent with prior pain crises. She was started on IV fluids and dilaudid 1mg  q2/prn and scheduled tylenol for pain control. She also had some right upper back pain associated with a right paraspinal muscle spasm. Patient received robaxin for this. She also used a tennis ball to massage her upper back, with some relief. She did not want a trigger point injection. She remained on dilaudid until the day prior to discharge when she was successfully transitioned to her home oxycodone 10mg  which she took every 4-6hrs for pain. Early in the transition, she only required one dose of morphine 2mg . She was discharged on her home oxycodone.   2. Abdominal pain: patient complained of left sided abdominal pain upon admission. An abdominal CT was done which showed a lobular spleen grossly unchanged from a previous CT. Patient continued having some abdominal pain that was associated with constipation and a potential component of acid reflux. Patient was put on a bowel regimen of miralax bid, sennokot and glycerin suppository as needed which helped with the constipation. Abdominal pain resolved at that point as well.   3. Sickle cell anemia: patient's hemoglobin on  admission was 9.1 with retic count of 11.1  and remained stable with a discharge hemoglobin of 8.6 and retic count of 12.5. She had a slightly elevated white blood cell count at 13.5 on admission which resolved by discharge at 9.9. She had a normal chest xray and never showed any evidence of acute chest or infection during the admission. Patient remained on home hydroxyurea.   Consults:  - physical therapy to help with mobility  Significant Diagnostic Studies:  CT pelvis and abdomen 06/21/11 CT ABDOMEN AND PELVIS WITH CONTRAST  Findings: Limited images through the lung bases demonstrate no  significant appreciable abnormality. The heart size is within  normal limits. No pleural or pericardial effusion.  Unremarkable liver, pancreas, adrenal glands. Status post  cholecystectomy. No biliary ductal dilatation. Lobular splenic  contour.  Symmetric renal enhancement. No hydronephrosis or hydroureter.  No bowel obstruction. No CT evidence for colitis. Normal  appendix. Normal terminal ileum. Subcentimeter ileocolic lymph  nodes.  Circumferential bladder wall thickening is nonspecific given  incomplete distension. CT appearance to the uterus and adnexa  within normal limits.  Circumaortic left renal vein. Otherwise, normal caliber  vasculature.  H shaped vertebrae with areas of sclerosis is in keeping with  sequelae of sickle cell disease. 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  Osseous changes of sickle cell disease.  CBC    Component Value Date/Time   WBC 9.9 06/27/2011 0945   RBC 2.69* 06/27/2011 0945   HGB 8.7* 06/27/2011 0945   HCT 24.5* 06/27/2011 0945  PLT 490* 06/27/2011 0945   MCV 91.1 06/27/2011 0945   MCH 32.3 06/27/2011 0945   MCHC 35.5 06/27/2011 0945   RDW 17.9* 06/27/2011 0945   LYMPHSABS 2.6 06/27/2011 0945   MONOABS 1.1*  06/27/2011 0945   EOSABS 0.5 06/27/2011 0945   BASOSABS 0.1 06/27/2011 0945    CMP     Component Value Date/Time   NA 139 06/24/2011 0610   K 3.9 06/24/2011 0610   CL 104 06/24/2011 0610   CO2 22 06/24/2011 0610   GLUCOSE 106* 06/24/2011 0610   BUN 6 06/24/2011 0610   CREATININE 0.56 06/24/2011 0610   CREATININE 0.59 06/13/2011 1142   CALCIUM 9.1 06/24/2011 0610   PROT 6.4 06/24/2011 0610   ALBUMIN 3.5 06/24/2011 0610   AST 32 06/24/2011 0610   ALT 28 06/24/2011 0610   ALKPHOS 61 06/24/2011 0610   BILITOT 1.5* 06/24/2011 0610   GFRNONAA >90 06/24/2011 0610   GFRAA >90 06/24/2011 0610   KUB on 06/22/11 IMPRESSION:  No evidence of small bowel obstruction or free air.  Treatments:  Was on dilaudid 2mg  q2/prn pain and was then transitioned to oxycodone 10mg  q4/q6/prn pain.  Remained on home dose of hydroxyurea Robaxin prn muscle spasm miralax 17gm bid and sennokot daily as well as glycerin suppository.   Discharge Exam: Blood pressure 93/59, pulse 95, temperature 98.9 F (37.2 C), temperature source Oral, resp. rate 18, height 5\' 3"  (1.6 m), weight 175 lb (79.379 kg), last menstrual period 05/05/2011, SpO2 98.00%. General appearance: alert, sleepy  Back:muscle spasm along right paraspinal muscle still present, tenderness to palpation along lower back bilaterally.  Resp: clear to auscultation bilaterally  Cardio: S1S2, soft systolic murmur, RRR  GI: soft, non tender, non distended, present bowel sounds  Ext: no edema, no tenderness to palpation  Disposition: Home or Self Care  Discharge Orders    Future Appointments: Provider: Department: Dept Phone: Center:   06/29/2011 2:45 PM Antoine Primas, DO [facility] [phone] [facility]   07/19/2011 3:30 PM [name], MD [facility] [phone] [facility]     Medication List  As of 06/27/2011 10:14 AM   TAKE these medications         folic acid 1 MG tablet   Commonly known as: FOLVITE   Take 1 mg by mouth daily.      hydroxyurea 500 MG capsule    Commonly known as: HYDREA   Take 1,000 mg by mouth every morning. May take with food to minimize GI side effects.      medroxyPROGESTERone 150 MG/ML injection   Commonly known as: DEPO-PROVERA   Inject 150 mg into the muscle every 3 (three) months. Last dose 04/20/11      methocarbamol 750 MG tablet   Commonly known as: ROBAXIN   Take 1 tablet (750 mg total) by mouth 4 (four) times daily as needed.      Oxycodone HCl 10 MG Tabs   Take 1 tablet (10 mg total) by mouth every 4 (four) hours as needed (pain).      pantoprazole 40 MG tablet   Commonly known as: PROTONIX   Take 1 tablet (40 mg total) by mouth daily at 12 noon.      polyethylene glycol packet   Commonly known as: MIRALAX / GLYCOLAX   Take 17 g by mouth 2 (two) times daily.      prenatal multivitamin 60-1 MG tablet   Take 1 tablet by mouth every morning.  senna 8.6 MG Tabs   Commonly known as: SENOKOT   Take 2 tablets (17.2 mg total) by mouth daily.           Follow-up Information    Follow up with SMITH,ZACHARY, DO. (appointment for manipulation for muscle spasms with Dr. Terrilee Files at the family medicine clinic on Wednesday 06/29/11 at 2:45pm)    Contact information:   [address] [place] [address] [phone]        Follow up items: - right paraspinal muscle spasm pain - abdominal pain and best home bowel regimen while on chronic narcotics  Signed: Marena Chancy 06/27/2011, 10:14 AM

## 2011-06-27 NOTE — Progress Notes (Signed)
Subjective: Pain much improved. Transitioned from dilaudid to home oxycodone 10mg  yesterday. Only required morphine 2mg  at 5pm yesterday. Has been taking oxycodone every 4hrs during the day but did not require any medication from 11pm to 5am.  Had a bowel movement yesterday. Abdominal pain resolved.   Objective: Vital signs in last 24 hours: Temp:  [98.9 F (37.2 C)-99.5 F (37.5 C)] 98.9 F (37.2 C) (02/11 0525) Pulse Rate:  [81-95] 95  (02/11 0525) Resp:  [18] 18  (02/11 0525) BP: (93-114)/(59-71) 93/59 mmHg (02/11 0525) SpO2:  [98 %-100 %] 98 % (02/11 0525) Weight change:  Last BM Date: 06/25/11  Intake/Output from previous day: 02/10 0701 - 02/11 0700 In: 3000 [I.V.:3000] Out: -  Intake/Output this shift: Total I/O In: 360 [P.O.:360] Out: -   General appearance: alert, sleepy Back:muscle spasm along right paraspinal muscle still present, tenderness to palpation along lower back bilaterally.  Resp: clear to auscultation bilaterally Cardio: S1S2, soft systolic murmur, RRR GI: soft, non tender, non distended, present bowel sounds Ext: no edema, no tenderness to palpation Lab Results: CBC with diff and retic: pending  Basename 06/26/11 0600 06/25/11 0600  WBC 10.4 11.4*  HGB 8.5* 8.6*  HCT 24.3* 24.6*  PLT 499* 441*   BMET No results found for this basename: NA:2,K:2,CL:2,CO2:2,GLUCOSE:2,BUN:2,CREATININE:2,CALCIUM:2 in the last 72 hours  Studies/Results: No results found.  Medications:  Scheduled:    . acetaminophen  650 mg Oral Q6H  . folic acid  1 mg Oral Daily  . Glycerin (Adult)  1 suppository Rectal Once  . heparin  5,000 Units Subcutaneous Q8H  . hydroxyurea  1,000 mg Oral BH-q7a  .  morphine injection  2 mg Intravenous Once  . pantoprazole  40 mg Oral Q1200  . polyethylene glycol  17 g Oral BID  . predniSONE  50 mg Oral Q breakfast  . prenatal vitamin w/FE, FA  1 tablet Oral BH-q7a  . senna  2 tablet Oral Daily   Continuous:    . sodium  chloride 125 mL/hr at 06/25/11 2141    Assessment/Plan: [name] is a 21 y.o. 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  1. Sickle Cell Pain Crisis: - pain improved. Now on oral meds. Patient feels comfortable going home today. Patient to keep taking oxycodone 10mg  q6hr at home for pain.  - muscle spasm: robaxin seems to help. Patient also has appointment on Wednesday with Dr. Katrinka Blazing for manipulation to try and relieve muscle spasm.  2. Anemia: Has been stable throughout the admission at 8.5. Will follow up on CBC with diff and retic for today. - continue hydroxyurea - no evidence of infection or acute chest. 3. Abdominal pain: improved after bowel movement, this was most likely due to constipation.  - plan to discharge patient home on miralax and sennokot - continue protonix for GERD 4. FEN/GI:   d/c iv fluids 5. Prophylaxis:  - Heparin  6. Disposition: home today pending CBC stable    LOS: 7 days   Franco Duley 06/27/2011, 8:54 AM

## 2011-06-27 NOTE — Progress Notes (Signed)
Seen and examined.  Agree with DC today as outlined by Dr. Gwenlyn Saran.

## 2011-06-29 ENCOUNTER — Ambulatory Visit: Payer: Medicaid Other | Admitting: Family Medicine

## 2011-06-29 NOTE — Discharge Summary (Signed)
Seen on the day of DC, see my separate note.  I agree with DC summary and plan as outlined by Dr. Gwenlyn Saran.

## 2011-07-13 ENCOUNTER — Inpatient Hospital Stay (HOSPITAL_COMMUNITY)
Admission: EM | Admit: 2011-07-13 | Discharge: 2011-07-15 | DRG: 552 | Disposition: A | Discharge status: Home / Self Care | Payer: Medicaid Other | Source: Ambulatory Visit | Attending: Family Medicine | Admitting: Family Medicine

## 2011-07-13 ENCOUNTER — Encounter (HOSPITAL_COMMUNITY): Payer: Self-pay | Admitting: *Deleted

## 2011-07-13 DIAGNOSIS — R112 Nausea with vomiting, unspecified: Secondary | ICD-10-CM | POA: Diagnosis present

## 2011-07-13 DIAGNOSIS — M5380 Other specified dorsopathies, site unspecified: Principal | ICD-10-CM | POA: Diagnosis present

## 2011-07-13 DIAGNOSIS — D5700 Hb-SS disease with crisis, unspecified: Secondary | ICD-10-CM

## 2011-07-13 DIAGNOSIS — K219 Gastro-esophageal reflux disease without esophagitis: Secondary | ICD-10-CM

## 2011-07-13 DIAGNOSIS — D571 Sickle-cell disease without crisis: Secondary | ICD-10-CM | POA: Diagnosis present

## 2011-07-13 DIAGNOSIS — F17200 Nicotine dependence, unspecified, uncomplicated: Secondary | ICD-10-CM | POA: Diagnosis present

## 2011-07-13 LAB — COMPREHENSIVE METABOLIC PANEL
ALT: 13 U/L (ref 0–35)
Alkaline Phosphatase: 56 U/L (ref 39–117)
CO2: 23 mEq/L (ref 19–32)
Chloride: 108 mEq/L (ref 96–112)
GFR calc Af Amer: >90 mL/min (ref >90)
GFR calc non Af Amer: >90 mL/min (ref >90)
Glucose, Bld: 88 mg/dL (ref 70–99)
Potassium: 4 mEq/L (ref 3.5–5.1)
Sodium: 139 mEq/L (ref 135–145)
Total Bilirubin: 1.4 mg/dL — ABNORMAL HIGH (ref 0.3–1.2)

## 2011-07-13 LAB — CBC
MCV: 90.2 fL (ref 78.0–100.0)
Platelets: 459 10*3/uL — ABNORMAL HIGH (ref 150–400)
RBC: 2.96 MIL/uL — ABNORMAL LOW (ref 3.87–5.11)
WBC: 9.4 10*3/uL (ref 4.0–10.5)

## 2011-07-13 LAB — DIFFERENTIAL
Eosinophils Relative: 4 % (ref 0–5)
Lymphocytes Relative: 29 % (ref 12–46)
Lymphs Abs: 2.8 10*3/uL (ref 0.7–4.0)
Neutrophils Relative %: 53 % (ref 43–77)

## 2011-07-13 MED ORDER — NALOXONE HCL 0.4 MG/ML IJ SOLN: 0.4000 mg | INTRAMUSCULAR | Status: DC | PRN

## 2011-07-13 MED ORDER — ACETAMINOPHEN 650 MG RE SUPP: 650.0000 mg | Freq: Four times a day (QID) | RECTAL | Status: DC | PRN

## 2011-07-13 MED ORDER — SODIUM CHLORIDE 0.9 % IV SOLN
INTRAVENOUS | Status: DC
  Administered 2011-07-14 – 2011-07-15 (×5): via INTRAVENOUS

## 2011-07-13 MED ORDER — SODIUM CHLORIDE 0.9 % IJ SOLN: 9.0000 mL | INTRAMUSCULAR | Status: DC | PRN

## 2011-07-13 MED ORDER — SODIUM CHLORIDE 0.9 % IV BOLUS (SEPSIS)
2000.0000 mL | Freq: Once | INTRAVENOUS | Status: AC
  Administered 2011-07-13: 2000 mL via INTRAVENOUS

## 2011-07-13 MED ORDER — ONDANSETRON HCL 4 MG/2ML IJ SOLN
4.0000 mg | Freq: Once | INTRAMUSCULAR | Status: AC
  Administered 2011-07-13: 4 mg via INTRAVENOUS

## 2011-07-13 MED ORDER — ONDANSETRON HCL 4 MG PO TABS: 4.0000 mg | ORAL_TABLET | Freq: Four times a day (QID) | ORAL | Status: DC | PRN

## 2011-07-13 MED ORDER — PRENATAL MULTIVITAMIN CH
1.0000 | ORAL_TABLET | ORAL | Status: DC
  Administered 2011-07-14 – 2011-07-15 (×2): 1 via ORAL
  Filled 2011-07-13 (×3): qty 1

## 2011-07-13 MED ORDER — FOLIC ACID 1 MG PO TABS
1.0000 mg | ORAL_TABLET | Freq: Every day | ORAL | Status: DC
  Administered 2011-07-14 – 2011-07-15 (×2): 1 mg via ORAL
  Filled 2011-07-13 (×3): qty 1

## 2011-07-13 MED ORDER — DIPHENHYDRAMINE HCL 50 MG/ML IJ SOLN: 12.5000 mg | Freq: Four times a day (QID) | INTRAMUSCULAR | Status: DC | PRN

## 2011-07-13 MED ORDER — DIPHENHYDRAMINE HCL 12.5 MG/5ML PO ELIX
12.5000 mg | ORAL_SOLUTION | Freq: Four times a day (QID) | ORAL | Status: DC | PRN
  Filled 2011-07-13: qty 5

## 2011-07-13 MED ORDER — DIPHENHYDRAMINE HCL 50 MG/ML IJ SOLN
25.0000 mg | Freq: Four times a day (QID) | INTRAMUSCULAR | Status: DC | PRN
  Administered 2011-07-13: 25 mg via INTRAVENOUS
  Filled 2011-07-13: qty 1

## 2011-07-13 MED ORDER — DICLOFENAC EPOLAMINE 1.3 % TD PTCH
1.0000 | MEDICATED_PATCH | Freq: Two times a day (BID) | TRANSDERMAL | Status: DC
  Administered 2011-07-14 (×3): 1 via TRANSDERMAL
  Filled 2011-07-13 (×6): qty 1

## 2011-07-13 MED ORDER — ONDANSETRON HCL 4 MG/2ML IJ SOLN
4.0000 mg | Freq: Four times a day (QID) | INTRAMUSCULAR | Status: DC | PRN
  Administered 2011-07-14 (×2): 4 mg via INTRAVENOUS
  Filled 2011-07-13 (×2): qty 2

## 2011-07-13 MED ORDER — HYDROMORPHONE HCL PF 1 MG/ML IJ SOLN
2.0000 mg | INTRAMUSCULAR | Status: DC | PRN
  Administered 2011-07-13: 2 mg via INTRAVENOUS
  Filled 2011-07-13: qty 2

## 2011-07-13 MED ORDER — PANTOPRAZOLE SODIUM 40 MG PO TBEC
40.0000 mg | DELAYED_RELEASE_TABLET | Freq: Every day | ORAL | Status: DC
  Administered 2011-07-14: 40 mg via ORAL
  Filled 2011-07-13 (×2): qty 1

## 2011-07-13 MED ORDER — DOCUSATE SODIUM 100 MG PO CAPS
100.0000 mg | ORAL_CAPSULE | Freq: Two times a day (BID) | ORAL | Status: DC
  Administered 2011-07-14 – 2011-07-15 (×3): 100 mg via ORAL
  Filled 2011-07-13 (×3): qty 1

## 2011-07-13 MED ORDER — MORPHINE SULFATE (PF) 1 MG/ML IV SOLN
INTRAVENOUS | Status: DC
  Administered 2011-07-14: 6 mg via INTRAVENOUS
  Administered 2011-07-14: 7.74 mg via INTRAVENOUS
  Administered 2011-07-14: 8 mg via INTRAVENOUS
  Administered 2011-07-14: 01:00:00 via INTRAVENOUS
  Administered 2011-07-14: 6 mg via INTRAVENOUS
  Administered 2011-07-14 (×2): 3 mg via INTRAVENOUS
  Administered 2011-07-15: 4 mg via INTRAVENOUS
  Administered 2011-07-15: 2 mg via INTRAVENOUS

## 2011-07-13 MED ORDER — ONDANSETRON HCL 4 MG/2ML IJ SOLN
INTRAMUSCULAR | Status: AC
  Filled 2011-07-13: qty 2

## 2011-07-13 MED ORDER — PROMETHAZINE HCL 25 MG/ML IJ SOLN
12.5000 mg | INTRAMUSCULAR | Status: AC
  Administered 2011-07-13: 12.5 mg via INTRAVENOUS
  Filled 2011-07-13: qty 1

## 2011-07-13 MED ORDER — HYDROMORPHONE HCL PF 2 MG/ML IJ SOLN
2.0000 mg | INTRAMUSCULAR | Status: DC | PRN
  Administered 2011-07-13 (×4): 2 mg via INTRAVENOUS
  Filled 2011-07-13 (×4): qty 1

## 2011-07-13 MED ORDER — HYDROMORPHONE HCL PF 2 MG/ML IJ SOLN
2.0000 mg | Freq: Once | INTRAMUSCULAR | Status: AC
  Administered 2011-07-13: 2 mg via INTRAVENOUS
  Filled 2011-07-13: qty 1

## 2011-07-13 MED ORDER — ACETAMINOPHEN 325 MG PO TABS
650.0000 mg | ORAL_TABLET | Freq: Four times a day (QID) | ORAL | Status: DC | PRN
  Administered 2011-07-14: 650 mg via ORAL
  Filled 2011-07-13: qty 2

## 2011-07-13 MED ORDER — HYDROXYUREA 500 MG PO CAPS
1000.0000 mg | ORAL_CAPSULE | ORAL | Status: DC
  Administered 2011-07-14: 1000 mg via ORAL
  Filled 2011-07-13 (×2): qty 2

## 2011-07-13 MED ORDER — HYDROMORPHONE HCL PF 1 MG/ML IJ SOLN: 2.0000 mg | INTRAMUSCULAR | Status: DC | PRN

## 2011-07-13 MED ORDER — ENOXAPARIN SODIUM 40 MG/0.4ML ~~LOC~~ SOLN
40.0000 mg | SUBCUTANEOUS | Status: DC
  Administered 2011-07-14 – 2011-07-15 (×2): 40 mg via SUBCUTANEOUS
  Filled 2011-07-13 (×2): qty 0.4

## 2011-07-13 NOTE — ED Notes (Signed)
Pt sleeping at this time.

## 2011-07-13 NOTE — H&P (Signed)
[name] is an 21 y.o. female.   Chief Complaint: Pain Crises HPI:  Patient complains of muscle spasm in her right upper back (paraspinal muscle). She said the pain started yesterday. She has been having issues with this muscle spasm for a while. She says the muscle pain triggers the rest of her sickle cell pain. She denies pain in any other location at this time. She has no chest pain or sob. No fever. She denies dysuria. She has emesis, including the food she ate in the ED. No recent illness. No bleeding.   Past Medical History  Diagnosis Date  . H/O: 1 miscarriage 03/22/2011  . 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 Active smoker 08/09/2010  . Depression 01/06/2011  . GERD (gastroesophageal reflux disease) 02/17/2011  . Trichotillomania     h/o  . Blood transfusion   . Sickle cell anemia with crisis     Past Surgical History  Procedure Date  . Cholecystectomy 05/2010  . Dilation and curettage of uterus 02/20/11    History reviewed. No pertinent family history. Social History:  reports that she has quit smoking. She has never used smokeless tobacco. She reports that she does not drink alcohol or use illicit drugs.  Allergies:  Allergies  Allergen Reactions  . Toradol Hives and Itching    Some itching, give with benedryl    Medications Prior to Admission  Medication Dose Route Frequency Provider Last Rate Last Dose  . diphenhydrAMINE (BENADRYL) injection 25 mg  25 mg Intravenous Q6H PRN Jethro Bastos, NP   25 mg at 07/13/11 1524  . HYDROmorphone (DILAUDID) injection 2 mg  2 mg Intravenous Once Jethro Bastos, NP   2 mg at 07/13/11 1150  . HYDROmorphone (DILAUDID) injection 2 mg  2 mg Intravenous Once Gerhard Munch, MD   2 mg at 07/13/11 1323  . HYDROmorphone (DILAUDID) injection 2 mg  2 mg Intravenous Q1H PRN Jethro Bastos, NP   2 mg at 07/13/11 1959  . ondansetron (ZOFRAN) injection 4 mg  4 mg Intravenous Once Jethro Bastos, NP   4 mg at 07/13/11 1209  . promethazine  (PHENERGAN) injection 12.5 mg  12.5 mg Intravenous To Major Gerhard Munch, MD   12.5 mg at 07/13/11 1504  . sodium chloride 0.9 % bolus 2,000 mL  2,000 mL Intravenous Once Jethro Bastos, NP   2,000 mL at 07/13/11 1149  . DISCONTD: HYDROmorphone (DILAUDID) injection 2 mg  2 mg Intravenous Q2H PRN Jethro Bastos, NP      . DISCONTD: HYDROmorphone (DILAUDID) injection 2 mg  2 mg Intravenous Q1H PRN Gerhard Munch, MD   2 mg at 07/13/11 1511   Medications Prior to Admission  Medication Sig Dispense Refill  . folic acid (FOLVITE) 1 MG tablet Take 1 mg by mouth daily.      . hydroxyurea (HYDREA) 500 MG capsule Take 1,000 mg by mouth every morning. May take with food to minimize GI side effects.      . pantoprazole (PROTONIX) 40 MG tablet Take 40 mg by mouth daily at 12 noon.      . Prenatal Vit-Fe Fumarate-FA (PRENATAL MULTIVITAMIN) 60-1 MG tablet Take 1 tablet by mouth every morning.      . medroxyPROGESTERone (DEPO-PROVERA) 150 MG/ML injection Inject 150 mg into the muscle every 3 (three) months. Last dose 04/20/11        Results for orders placed during the hospital encounter of 07/13/11 (from the past 48 hour(s))  CBC  Status: Abnormal   Collection Time   07/13/11 12:23 PM      Component Value Range Comment   WBC 9.4  4.0 - 10.5 (K/uL)    RBC 2.96 (*) 3.87 - 5.11 (MIL/uL)    Hemoglobin 9.6 (*) 12.0 - 15.0 (g/dL)    HCT 91.4 (*) 78.2 - 46.0 (%)    MCV 90.2  78.0 - 100.0 (fL)    MCH 32.4  26.0 - 34.0 (pg)    MCHC 36.0  30.0 - 36.0 (g/dL)    RDW 95.6 (*) 21.3 - 15.5 (%)    Platelets 459 (*) 150 - 400 (K/uL)   DIFFERENTIAL     Status: Abnormal   Collection Time   07/13/11 12:23 PM      Component Value Range Comment   Neutrophils Relative 53  43 - 77 (%)    Neutro Abs 5.0  1.7 - 7.7 (K/uL)    Lymphocytes Relative 29  12 - 46 (%)    Lymphs Abs 2.8  0.7 - 4.0 (K/uL)    Monocytes Relative 13 (*) 3 - 12 (%)    Monocytes Absolute 1.3 (*) 0.1 - 1.0 (K/uL)    Eosinophils Relative 4  0 -  5 (%)    Eosinophils Absolute 0.3  0.0 - 0.7 (K/uL)    Basophils Relative 1  0 - 1 (%)    Basophils Absolute 0.1  0.0 - 0.1 (K/uL)   COMPREHENSIVE METABOLIC PANEL     Status: Abnormal   Collection Time   07/13/11 12:23 PM      Component Value Range Comment   Sodium 139  135 - 145 (mEq/L)    Potassium 4.0  3.5 - 5.1 (mEq/L)    Chloride 108  96 - 112 (mEq/L)    CO2 23  19 - 32 (mEq/L)    Glucose, Bld 88  70 - 99 (mg/dL)    BUN 6  6 - 23 (mg/dL)    Creatinine, Ser 0.86  0.50 - 1.10 (mg/dL)    Calcium 9.4  8.4 - 10.5 (mg/dL)    Total Protein 6.9  6.0 - 8.3 (g/dL)    Albumin 3.9  3.5 - 5.2 (g/dL)    AST 19  0 - 37 (U/L)    ALT 13  0 - 35 (U/L)    Alkaline Phosphatase 56  39 - 117 (U/L)    Total Bilirubin 1.4 (*) 0.3 - 1.2 (mg/dL)    GFR calc non Af Amer >90  >90 (mL/min)    GFR calc Af Amer >90  >90 (mL/min)   RETICULOCYTES     Status: Abnormal   Collection Time   07/13/11 12:23 PM      Component Value Range Comment   Retic Ct Pct 8.8 (*) 0.4 - 3.1 (%)    RBC. 2.96 (*) 3.87 - 5.11 (MIL/uL)    Retic Count, Manual 260.5 (*) 19.0 - 186.0 (K/uL)    No results found.  ROS Pertinent items are noted in HPI. No fever, chills, night sweats, weight loss.  Blood pressure 103/69, pulse 79, temperature 97.4 F (36.3 C), temperature source Oral, resp. rate 16, last menstrual period 06/29/2011, SpO2 100.00%. Physical Exam General: AAF, in NAD, emesis in the sink, talking normally. Lungs:  Normal respiratory effort, chest expands symmetrically. Lungs are clear to auscultation, no crackles or wheezes. Heart - Regular rate and rhythm.  No murmurs, gallops or rubs.    Abdomen: soft and non-tender without masses, organomegaly or hernias noted.  No guarding or rebound Extremities:  No cyanosis, edema, or deformity noted with good range of motion of all major joints.   Back: palpable right paraspinal muscle spasm, tender to palpation. No spinal tenderness. No deformity.  Skin:  Intact without  suspicious lesions or rashes  Assessment/Plan Pt. Is a 21 y/o aaf with SS disease with sickle cell crises and muscle spasm in upper back  1. Sickle Cell Pain Crises Morphine PCA CBC/Retic C/w hydroxyurea Chest xray done No signs of acute chest or boney necrosis Oxygen/NS @125  cc/hr  2. Muscle Spasm Warm compress I think she would benefit from manipulation from one of our DO physicians Allergy to Toradol Will use Diclofenac patch on affected area  Cr wnl  3. Emesis Likely secondary to opioid administration in ED Will follow  4. FENGI - regular diet, protonix (home med), Lovenox for dvt proph.  5. Dispo: Pending resolution of pain crises requiring IV narcotics  Proctor Carriker 07/13/2011, 8:43 PM

## 2011-07-13 NOTE — ED Notes (Signed)
Pt c/o of back pain and asked for a second pillow. Pt given another pillow under her back.

## 2011-07-13 NOTE — ED Provider Notes (Signed)
I have seen and evaluated this young female patient w sickle cell.  She will be placed on the CDU protocol  Gerhard Munch, MD 07/13/11 [phone]

## 2011-07-13 NOTE — ED Notes (Signed)
Hx of sickle cell, pain started last night to bilateral arms and mid back. Last crisis was 3 weeks ago.

## 2011-07-13 NOTE — ED Notes (Signed)
Regular Diet Tray Ordered 

## 2011-07-13 NOTE — ED Notes (Signed)
Pt c/o feeling nauseated, advised provider.

## 2011-07-13 NOTE — ED Provider Notes (Signed)
3:25 PM Patient is in CDU under observation, sickle cell protocol.  Patient reports 8/10 pain in bilateral upper arms and right upper back.  States this is typical of her sickle cell pain.  PCP is [facility].  Per pt, she has been to the ED 7 times in the past 5 months - increased in frequency since she had a miscarriage.  Pt does not have a hx of acute chest syndrome.  Denies CP, SOB.  Patient to continue on protocol.  Will continue to follow.    8:18 PM Patient with increased pain, now crying, HR 130s.  States the increase in pain has been gradual.  No CP, SOB.  States pain in her back is better with palpation, worse with no pressure.  States she needs to be admitted.  Has been getting 2mg  Dilaudid Q1hr. Will call [facility] for admission.    Admitted to [facility].    Dillard Cannon [place], [place] 07/13/11 2259

## 2011-07-13 NOTE — ED Provider Notes (Signed)
Medical screening examination/treatment/procedure(s) were performed by non-physician practitioner and as supervising physician I was immediately available for consultation/collaboration.  Shadrick Senne, MD 07/13/11 1811 

## 2011-07-13 NOTE — ED Notes (Signed)
C/o increased pain

## 2011-07-13 NOTE — ED Provider Notes (Signed)
History     CSN: [id number]  Arrival date & time 07/13/11  1025   First MD Initiated Contact with Patient 07/13/11 1502      Chief Complaint  Patient presents with  . Sickle Cell Pain Crisis    (Consider location/radiation/quality/duration/timing/severity/associated sxs/prior treatment) Patient is a 21 y.o. female presenting with sickle cell pain. The history is provided by the patient. No language interpreter was used.  Sickle Cell Pain Crisis  This is a chronic problem. The current episode started yesterday. The onset was gradual. The problem occurs occasionally. The problem has been gradually worsening. The pain is present in the upper extremities (bilateral upper extremities and upper back). The pain is moderate. The symptoms are not relieved by one or more prescription drugs. Associated symptoms include photophobia, nausea, vomiting and back pain. Pertinent negatives include no chest pain, no blurred vision, no double vision, no abdominal pain, no constipation, no diarrhea, no dysuria, no hematuria, no vaginal bleeding, no vaginal discharge, no congestion, no ear pain, no headaches, no rhinorrhea, no sore throat, no swollen glands, no joint pain, no neck pain, no neck stiffness, no loss of sensation, no tingling, no weakness, no cough, no difficulty breathing, no rash and no eye pain. There is no swelling present. She has been crying more. Urine output has been normal. She sickle cell type is SS. There is no history of acute chest syndrome. There is no history of stroke. There were no sick contacts. She has received no recent medical care.  Reports bilateral upper extremity pain with upper back pain as well. Tried her oxycodone for pain with no improvement.  Pain started last pm.  No chest pain.  Past Medical History  Diagnosis Date  . H/O: 1 miscarriage 03/22/2011  . 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 Active smoker 08/09/2010  . Depression 01/06/2011  . GERD (gastroesophageal reflux disease)  02/17/2011  . Trichotillomania     h/o  . Blood transfusion   . Sickle cell anemia with crisis     Past Surgical History  Procedure Date  . Cholecystectomy 05/2010  . Dilation and curettage of uterus 02/20/11    History reviewed. No pertinent family history.  History  Substance Use Topics  . Smoking status: Former Games developer  . Smokeless tobacco: Never Used  . Alcohol Use: No    OB History    Grav Para Term Preterm Abortions TAB SAB Ect Mult Living   1    1           Review of Systems  HENT: Negative for ear pain, congestion, sore throat, rhinorrhea and neck pain.   Eyes: Positive for photophobia. Negative for blurred vision, double vision and pain.  Respiratory: Negative for cough.   Cardiovascular: Negative for chest pain.  Gastrointestinal: Positive for nausea and vomiting. Negative for abdominal pain, diarrhea and constipation.  Genitourinary: Negative for dysuria, hematuria, vaginal bleeding and vaginal discharge.  Musculoskeletal: Positive for back pain. Negative for joint pain.  Skin: Negative for rash.  Neurological: Negative for tingling, weakness and headaches.  All other systems reviewed and are negative.    Allergies  Toradol  Home Medications   Current Outpatient Rx  Name Route Sig Dispense Refill  . FOLIC ACID 1 MG PO TABS Oral Take 1 mg by mouth daily.    Marland Kitchen HYDROXYUREA 500 MG PO CAPS Oral Take 1,000 mg by mouth every morning. May take with food to minimize GI side effects.    . OXYCODONE HCL 10 MG PO TABS  Oral Take 10 mg by mouth 4 (four) times daily as needed. pain    . PANTOPRAZOLE SODIUM 40 MG PO TBEC Oral Take 40 mg by mouth daily at 12 noon.    Marland Kitchen PRENATAL RX 60-1 MG PO TABS Oral Take 1 tablet by mouth every morning.    Marland Kitchen MEDROXYPROGESTERONE ACETATE 150 MG/ML IM SUSP Intramuscular Inject 150 mg into the muscle every 3 (three) months. Last dose 04/20/11      BP 114/86  Pulse 90  Temp(Src) 99.2 F (37.3 C) (Oral)  Resp 20  SpO2 100%  LMP  06/29/2011  Physical Exam  Nursing note and vitals reviewed. Constitutional: She is oriented to person, place, and time. She appears well-developed and well-nourished.  HENT:  Head: Normocephalic and atraumatic.  Eyes: Conjunctivae and EOM are normal. Pupils are equal, round, and reactive to light.  Neck: Normal range of motion. Neck supple.  Cardiovascular: Normal rate, regular rhythm, normal heart sounds and intact distal pulses.  Exam reveals no gallop and no friction rub.   No murmur heard. Pulmonary/Chest: Effort normal and breath sounds normal.  Abdominal: Soft. Bowel sounds are normal.  Musculoskeletal: Normal range of motion. She exhibits tenderness. She exhibits no edema.       Upper back and upper extremities.  Neurological: She is alert and oriented to person, place, and time. She has normal reflexes.  Skin: Skin is warm and dry.  Psychiatric: She has a normal mood and affect.    ED Course  Procedures (including critical care time)  Labs Reviewed  CBC - Abnormal; Notable for the following:    RBC 2.96 (*)    Hemoglobin 9.6 (*)    HCT 26.7 (*)    RDW 15.8 (*)    Platelets 459 (*)    All other components within normal limits  DIFFERENTIAL - Abnormal; Notable for the following:    Monocytes Relative 13 (*)    Monocytes Absolute 1.3 (*)    All other components within normal limits  COMPREHENSIVE METABOLIC PANEL - Abnormal; Notable for the following:    Total Bilirubin 1.4 (*)    All other components within normal limits  RETICULOCYTES - Abnormal; Notable for the following:    Retic Ct Pct 8.8 (*)    RBC. 2.96 (*)    Retic Count, Manual 260.5 (*)    All other components within normal limits   No results found.   No diagnosis found.    MDM  Sickle cell crisis with upper extremity pain and upper back pain.  Dilaudid 2mg  x 3 given in the ER with zofran and phenergan.  Pain still 5/10.  Report given to [facility].  She will dispo this patient on CDU protocol for  Sickle Cell Pain Crisis. Dr. Jeraldine Loots aware.  Last Crisis 3 weeks ago.        Jethro Bastos, NP 07/13/11 [phone]

## 2011-07-14 ENCOUNTER — Inpatient Hospital Stay (HOSPITAL_COMMUNITY): Payer: Medicaid Other

## 2011-07-14 LAB — CBC
MCH: 33.3 pg (ref 26.0–34.0)
MCHC: 36 g/dL (ref 30.0–36.0)
MCV: 91.7 fL (ref 78.0–100.0)
Platelets: 423 10*3/uL — ABNORMAL HIGH (ref 150–400)
RBC: 2.76 MIL/uL — ABNORMAL LOW (ref 3.87–5.11)
RDW: 16 % — ABNORMAL HIGH (ref 11.5–15.5)

## 2011-07-14 LAB — BASIC METABOLIC PANEL
CO2: 25 mEq/L (ref 19–32)
Calcium: 8.8 mg/dL (ref 8.4–10.5)
Glucose, Bld: 106 mg/dL — ABNORMAL HIGH (ref 70–99)
Sodium: 138 mEq/L (ref 135–145)

## 2011-07-14 MED ORDER — MORPHINE SULFATE (PF) 1 MG/ML IV SOLN
INTRAVENOUS | Status: AC
  Filled 2011-07-14: qty 25

## 2011-07-14 MED ORDER — NICOTINE 14 MG/24HR TD PT24
14.0000 mg | MEDICATED_PATCH | Freq: Every day | TRANSDERMAL | Status: DC
  Filled 2011-07-14 (×2): qty 1

## 2011-07-14 MED ORDER — HYDROXYUREA 500 MG PO CAPS
1000.0000 mg | ORAL_CAPSULE | Freq: Every day | ORAL | Status: DC
  Administered 2011-07-15: 1000 mg via ORAL
  Filled 2011-07-14 (×3): qty 2

## 2011-07-14 NOTE — Progress Notes (Signed)
Subjective: Patient slept 6 hours overnight and is feeling well. Patient continues to have pain in the right paraspinal muscles as well as right shoulder. Pain is under control with IV morphine. Patient denies and CP or SOB breath, but does endorse pain in her right rib cage upon deep inspiration. No N/V/ Good appetite.  Objective: Vital signs in last 24 hours: Temp:  [97.2 F (36.2 C)-99.3 F (37.4 C)] 98.7 F (37.1 C) (02/28 0539) Pulse Rate:  [70-114] 90  (02/28 0539) Resp:  [16-20] 18  (02/28 0824) BP: (96-128)/(48-93) 96/48 mmHg (02/28 0539) SpO2:  [98 %-100 %] 99 % (02/28 0824) Weight:  [178 lb 4.8 oz (80.876 kg)] 178 lb 4.8 oz (80.876 kg) (02/27 2314) Weight change:     Intake/Output from previous day: 02/27 0701 - 02/28 0700 In: 616.7 [I.V.:616.7] Out: 400 [Urine:400] Intake/Output this shift:    General appearance: cooperative Back: symmetric, no curvature. ROM normal. No CVA tenderness., TTP of right paraspinal muscles and latisimus dorsi Resp: clear to auscultation bilaterally Chest wall: no tenderness Cardio: regular rate and rhythm, S1, S2 normal, no murmur, click, rub or gallop Pulses: 2+ and symmetric  Lab Results:  Basename 07/14/11 0001 07/13/11 1223  WBC 11.8* 9.4  HGB 9.2* 9.6*  HCT 25.3* 26.7*  PLT 423* 459*   BMET  Basename 07/14/11 0001 07/13/11 1223  NA -- 139  K -- 4.0  CL -- 108  CO2 -- 23  GLUCOSE -- 88  BUN -- 6  CREATININE 0.58 0.54  CALCIUM -- 9.4    Studies/Results: None performed.  Medications: I have reviewed the patient's current medications.  Assessment/Plan: 1. Sickle Cell Pain Crises  - Morphine PCA  - Continue hydroxyurea - Reticulocyte count ordered  - Oxygen/NS @125  cc/hr   2. Muscle Spasm  - Will use Diclofenac patch on affected area is helping. - Morphine PCA also helping to control pain  3. Emesis  - Likely secondary to opioid administration in ED  - Ate breakfast without difficulty  4. FENGI - regular  diet, protonix (home med) 5. PPX - Lovenox 6. Dispo: Pending resolution of pain crises requiring IV narcotics   LOS: 1 day   Tussey, Shane 07/14/2011, 9:36 AM  PGY-2 ADDENDUM:  I have seen and examined patient and discussed case with MSIV Tussey.  Patient was asleep and did not wish to wake up or speak to me this morning.  I asked her if I could examine her while she slept, but my exam was limited.  General: sleeping comfortably, in no acute distress Respiratory: course BS, but no wheezes, rales, rhonchi MSK: no C/C/E  I agree with above A/P with the following additions: - Patient complained of R side flank/rib pain, no mid-chest pain so will consider CXR today - Continue PCA and Diclofenac for pain - Consider consulting Sickle Cell clinic if patient agreeable - Will check in on patient later this afternoon when more awake/alert - Dispo: pending transition to PO pain regimen and resolution of nausea/vomiting  DE LA CRUZ,Jaimeson Gopal 07/14/11 @ 1015

## 2011-07-14 NOTE — H&P (Signed)
[facility] Attending Admission Note  Patient seen and examined by me, discussed with admitting resident Dr. Rivka Safer.  Briefly, a 20yoF well known to our service for recurrent sickle cell vaso-occlusive crises who presents via ED for sharp spasmodic R sided thoracic back pain that began at midnight the evening prior to admission.  Had been admitted for pain crisis in mid-December, when she had similar episode of back pain in R paraspinous muscles, which eventually subsided with heat, muscle relaxants.  Has been maintained on daily skelaxin at home, which she reports has had her pain-free since then, until this episode.  No recent changes in body ergonomics or activity other than increased walking, and frequent driving. At present she describes her pain as 8/10, was 10/10 at presentation to ED.  Pain radiates to R shoulder and makes it difficult for her to lift above horizontal. States that she had some discomfort with R arm lifting before admission, but was tolerable and did not affect her functioning.   Denies fevers, chills, dysuria, cough, shortness of breath, chest pain with this episode of illness.   PE Well appearing, no apparent distress. Neck supple.  MSK: Tenderness to palpation of R paraspinous mm throughout thoracic spine; tenderness to palpation anterior R shoulder, and unable to lift above horizontal passively or actively.  Palpable radial pulses; sensation grossly intact bilat hands.  Handgrip full and symmetric.  Labs reviewed; Hgb 9.2 which is consistent with her baseline Hgb (around 9).  Assess/Plan: Patient with prior vaso-occlusive crises, presenting with what appears to be musculoskeletal spasm along R paraspinous muscles; I suspect that this is unrelated to true vaso-occlusive crisis.  I favor Physical Therapy involvement, specifically to ask them to consider other modalities that may be helpful for patient's spasm (ie, consideration of TENS, [place], aquatherapy, etc).  I spoke with patient about  the fact that while narcotics provide pain relief, they are not useful for relieving muscular inflammation and spasm.  She reports "itch" with toradol in the past, unsure of other NSAID allergies.  Would review prior med recs to see if patient has tolerated NSAIDS and consider this in conjunction with heat. Prior x-ray R shoulder has been negative (04/2011), but I wonder if there is some underlying R shoulder MSK condition affecting her body mechanics that is triggering her spasm.   Paula Compton, M.D.

## 2011-07-14 NOTE — Progress Notes (Signed)
S: Visited patient this afternoon. Patient was nauseated and bent over a trash can. Patient had vomited while in the emergency department, but has not vomited since that time. Patient continues to endorse the pain in back as well as pain in right lateral chest during inspiration and expiration.  O: Gen: Patient bent over trash can and appears nauseated. Back/Chest: Pain in right paraspinal region.  CTAB RRR  A/P: 1. Back/Chest Pain: -Order chest X-ray -Continue Morphine PCA - transition to po pain medication as tolerated -Continue diclofenac patch  2. Nausea - Possibly narcotic induced nausea although patient denies any history of this. Will wait to transition patient to po pain medication until Nausea is under control. - Continue Zofran treatment for nausea.  3. Dispo - Patient needs to tolerate po pain medication prior to dispo.  PGY-2 ADDENDUM:  I have discussed above findings with MSIV and agree with his plan.  Will check on patient later today.  Unable to transition to PO pain medications at this time.  Will likely DC home tomorrow.  DE LA CRUZ,Arhianna Ebey

## 2011-07-14 NOTE — ED Provider Notes (Signed)
I have seen the patient (please see initial note).  The patient was on protocol due to ongoing pain and failed to improve sufficiently for d/c.  Patient admitted for pain control, further E/M.  Gerhard Munch, MD 07/14/11 [phone]

## 2011-07-14 NOTE — Progress Notes (Signed)
Patient seen by Dr Paula Compton today. See his attending note.

## 2011-07-15 MED ORDER — OXYCODONE HCL 5 MG PO TABS: 10.0000 mg | ORAL_TABLET | ORAL | Status: DC | PRN

## 2011-07-15 MED ORDER — OXYCODONE HCL 10 MG PO TB12: 10.0000 mg | ORAL_TABLET | Freq: Four times a day (QID) | ORAL | Status: DC | PRN

## 2011-07-15 NOTE — Discharge Summary (Signed)
I discussed with Dr Madolyn Frieze & MS IV Lowell Bouton.  I agree with their plans documented in their  Discharge Note for today.

## 2011-07-15 NOTE — Progress Notes (Signed)
Subjective: Patient slept well overnight. Patient has had no episodes of nausea since her episode around lunch time yesterday. The pain in her back and in her chest have been getting better. No SOB/CP/fever/chills.  Objective: Vital signs in last 24 hours: Temp:  [98.2 F (36.8 C)-98.8 F (37.1 C)] 98.8 F (37.1 C) (03/01 0629) Pulse Rate:  [85-94] 85  (03/01 0629) Resp:  [14-27] 14  (03/01 0809) BP: (101-118)/(55-79) 101/56 mmHg (03/01 0629) SpO2:  [90 %-100 %] 100 % (03/01 0809) Weight change:  Last BM Date: 07/13/11  Intake/Output from previous day: 02/28 0701 - 03/01 0700 In: 2739.8 [I.V.:2739.8] Out: 2300 [Urine:2300] Intake/Output this shift:    General appearance: NAD, well rested Back: minimal pain upon palpation of perispinal muscles and latisimus dorsi Resp: clear to auscultation bilaterally Chest wall: no tenderness Cardio: regular rate and rhythm, S1, S2 normal, no murmur, click, rub or gallop Pulses: 2+ and symmetric Neurologic: Grossly normal  Lab Results:  Basename 07/14/11 0955 07/14/11 0001  WBC 11.2* 11.8*  HGB 9.3* 9.2*  HCT 25.8* 25.3*  PLT 393 423*   BMET  Basename 07/14/11 0955 07/14/11 0001 07/13/11 1223  NA 138 -- 139  K 3.7 -- 4.0  CL 106 -- 108  CO2 25 -- 23  GLUCOSE 106* -- 88  BUN 4* -- 6  CREATININE 0.53 0.58 --  CALCIUM 8.8 -- 9.4    Studies/Results: Dg Chest 2 View  07/14/2011  *RADIOLOGY REPORT*  Clinical Data: Chest pain.  Back pain.  Sickle cell.  CHEST - 2 VIEW  Comparison: 06/09/2011.  Findings: Trachea is midline.  Heart size stable.  Linear scarring in the right upper lobe.  Lungs are otherwise clear.  No pleural fluid.  IMPRESSION: No acute findings.  Original Report Authenticated By: Reyes Ivan, M.D.    Medications: I have reviewed the patient's current medications.  Assessment/Plan: 21 y/o AAF with Sickle Cell anemia who presents with a pain crisis and upper back spasm.  1. Sickle Cell Pain Crises  - Pain  under control with Morphine PCA, ween to po pain medication as tolerated - Continue hydroxyurea  - Oxygen/NS @125  cc/hr>>>D/C O2, KVO fluids - CXR - no acute process  2. Muscle Spasm  - Mostly controlled with Diclofenac patch and heating pad   3. Emesis  - Likely secondary to opioid administration in ED  - last episode of nausea yesterday at lunch yesterday and benefited from Zofran usage - N/V appears under control can likely transition to po pain medication a this point  4. FENGI - regular diet, protonix (home med)  5. PPX - Lovenox  6. Dispo: Transition to po pain medication and hopefully home later today.   LOS: 2 days   Tussey, Shane 07/15/2011, 9:20 AM  I have seen and examined the patient, and I agree with Shane's assessment and plan. Pain is currently controlled and seems to be due to muscle spasm rather than sickle cell pain crisis. We will transition to oral pain medications and anticipate discharge later today.   Etta Quill. Madolyn Frieze, MD PGY-2

## 2011-07-15 NOTE — Progress Notes (Signed)
Discharged home. Home discharged instruction given to patient, no question verbalized. Alert and oriented, not in any distress, denies pain on discharge, ambulatory.

## 2011-07-15 NOTE — Discharge Summary (Signed)
Physician Discharge Summary  Patient ID: [name] MRN: [id number] DOB/AGE: 01/25/1991 21 y.o.  Admit date: 07/13/2011 Discharge date: 07/15/2011  Admission Diagnoses: 1. Sickle Cell Anemia 2. Back Pain 3. Depression 4. GERD  Discharge Diagnoses:  1. Muscle spasm 2. Sickle cell anemia, not in crisis  Discharged Condition: good  Hospital Course:  HPI:  Patient complains of muscle spasm in her right upper back (paraspinal muscle). She said the pain started the day of admission. She has been having issues with this muscle spasm for a while. She was concerned the muscle spasms were triggering her sickle cell. She denies pain in any other location at this time. She has no chest pain or sob. No fever. She denies dysuria. She has emesis, including the food she ate in the ED.  No recent illness. No bleeding.   Back Spasm - Back spasm was a major cause of pain for her during admission. Diclofenac patch and heating pad was used to alleviate the pain. On day of admission pain was markedly better.  Pain Crisis - Patient with history of sickle cell anemia comes in with a pain crisis. Patient had no symptoms of acute chest syndrome and CXR showed no acute changes. Patient was treated with oxygen, morphine PCA, IVF, and hydroxyurea. Pain got better during stay. Patient was transitioned to po medication after narcotic induced nausea subsided and the patient was discharged on home medication.  Consults: None  Significant Diagnostic Studies:  CXR - no acute changes  Treatments: Refer to above HPI.  Discharge Exam: Blood pressure 101/56, pulse 85, temperature 98.8 F (37.1 C), temperature source Oral, resp. rate 14, height 5\' 2"  (1.575 m), weight 178 lb 4.8 oz (80.876 kg), last menstrual period 06/29/2011, SpO2 100.00%. General appearance: NAD Head: Normocephalic, without obvious abnormality, atraumatic Back: Mild tenderness to palpation of upper back muscles Resp: clear to auscultation  bilaterally and pain in right ribs upon deep inspirations Chest wall: no tenderness Cardio: regular rate and rhythm, S1, S2 normal, no murmur, click, rub or gallop GI: soft, non-tender; bowel sounds normal; no masses,  no organomegaly Extremities: extremities normal, atraumatic, no cyanosis or edema Pulses: 2+ and symmetric Skin: Skin color, texture, turgor normal. No rashes or lesions  Disposition: 01-Home or Self Care  Discharge Orders    Future Appointments: Provider: Department: Dept Phone: Center:   07/19/2011 3:30 PM [name], MD [facility] [phone] [facility]     Medication List  As of 07/15/2011 12:03 PM   ASK your doctor about these medications         folic acid 1 MG tablet   Commonly known as: FOLVITE   Take 1 mg by mouth daily.      hydroxyurea 500 MG capsule   Commonly known as: HYDREA   Take 1,000 mg by mouth every morning. May take with food to minimize GI side effects.      medroxyPROGESTERone 150 MG/ML injection   Commonly known as: DEPO-PROVERA   Inject 150 mg into the muscle every 3 (three) months. Last dose 04/20/11      Oxycodone HCl 10 MG Tabs   Take 10 mg by mouth 4 (four) times daily as needed. pain      pantoprazole 40 MG tablet   Commonly known as: PROTONIX   Take 40 mg by mouth daily at 12 noon.      prenatal multivitamin 60-1 MG tablet   Take 1 tablet by mouth every morning.  Follow-up Information    Follow up with [name], MD .         Signed: Lavada Mesi 07/15/2011, 12:03 PM  Priscella Mann, MD PGY-2

## 2011-07-19 ENCOUNTER — Ambulatory Visit: Payer: Medicaid Other | Admitting: Family Medicine

## 2011-07-20 ENCOUNTER — Encounter: Payer: Self-pay | Admitting: Family Medicine

## 2011-07-20 ENCOUNTER — Ambulatory Visit (INDEPENDENT_AMBULATORY_CARE_PROVIDER_SITE_OTHER): Payer: Medicaid Other | Admitting: Family Medicine

## 2011-07-20 DIAGNOSIS — D571 Sickle-cell disease without crisis: Secondary | ICD-10-CM

## 2011-07-20 DIAGNOSIS — Z309 Encounter for contraceptive management, unspecified: Secondary | ICD-10-CM

## 2011-07-20 DIAGNOSIS — Z733 Stress, not elsewhere classified: Secondary | ICD-10-CM

## 2011-07-20 DIAGNOSIS — M549 Dorsalgia, unspecified: Secondary | ICD-10-CM

## 2011-07-20 DIAGNOSIS — F439 Reaction to severe stress, unspecified: Secondary | ICD-10-CM

## 2011-07-20 MED ORDER — OXYCODONE HCL 10 MG PO TABS: 10.0000 mg | ORAL_TABLET | Freq: Four times a day (QID) | ORAL | Status: DC | PRN

## 2011-07-20 NOTE — Patient Instructions (Signed)
Sickle cell: I will refill your oxycodone.  Continue taking vitamin and folic acid. Talk with Dr. Mikel Cella and ask her what labs she wants me to draw, and how frequent.   Stress: Remember that Nelva Bush is a Chief Technology Officer.    Birth control: Follow up with health department next week.   Back pain: Muscle relaxers as needed.  We will call you with your physical therapy appointment.

## 2011-07-20 NOTE — Progress Notes (Signed)
  Subjective:    Patient ID: [name], female    DOB: 06/01/1990, 21 y.o.   MRN: [id number]  HPI Sickle cell followup: Patient states that she has been doing well since discharge from hospital on March 1. Patient states she is taking headrests urea daily. Also taking Bentyl vitamin daily. Out of folic acid. States that Dr. Mikel Cella requested that I do hydroxyurea blood work and fax the results. I do not no exactly what labs she wants. Nor does she have a letter saying fax number for contact information for Dr. Mikel Cella. Right arm sickle cell pain that was present during admission is now resolved.  Patient states that she dropped her oxycodone on in the rain with walking out of her house this destroyed a lot of her supply. Has 10 oxycodone left.   Right shoulder/back pain: Patient states that cramping/spasm in muscles of right shoulder scapular area occurs on a daily basis. Comes and goes. States she is doing home exercises that were prescribed at last appointment one time a day. Use muscle relaxers as needed. With some relief. Normal strength in extremities. No weakness.no fever. No urinary or bowel incontinence.  Stress: Patient continues to endorse stress at work at [employer]. Patient have not contacted Nelva Bush as discussed at last appointment.  Birth control: Patient states that she does not like that to injection. Likes to have a period per month. Does not like the way this has affected her menstrual cycle. States she has an appointment with the health department on Tuesday of next week. Wants to start on birth control pills and can get these were free at this clinic. No vaginal bleeding. No vaginal discharge.  Review of Systems As per above.    Objective:   Physical Exam  Constitutional: She appears well-developed and well-nourished.  HENT:  Head: Normocephalic and atraumatic.  Neck: Neck supple.  Cardiovascular: Normal rate, regular rhythm and normal heart sounds.   No murmur  heard. Pulmonary/Chest: Effort normal. No respiratory distress. She has no wheezes. She has no rales.  Abdominal: Soft. She exhibits no distension.  Musculoskeletal: She exhibits no edema.       No back pain on exam.  Normal rom of upper ext bilateral.   Neurological: She is alert.  Skin: No rash noted.  Psychiatric: She has a normal mood and affect. Her behavior is normal.          Assessment & Plan:

## 2011-07-25 ENCOUNTER — Other Ambulatory Visit: Payer: Self-pay | Admitting: Family Medicine

## 2011-07-25 DIAGNOSIS — M549 Dorsalgia, unspecified: Secondary | ICD-10-CM

## 2011-07-25 IMAGING — CR DG CHEST 2V
2 series · 2 of 2 positions shown · non-contrast
Comparison: 07/12/2010

CLINICAL DATA: Syncope

CHEST - 2 VIEW

[w chest pa]
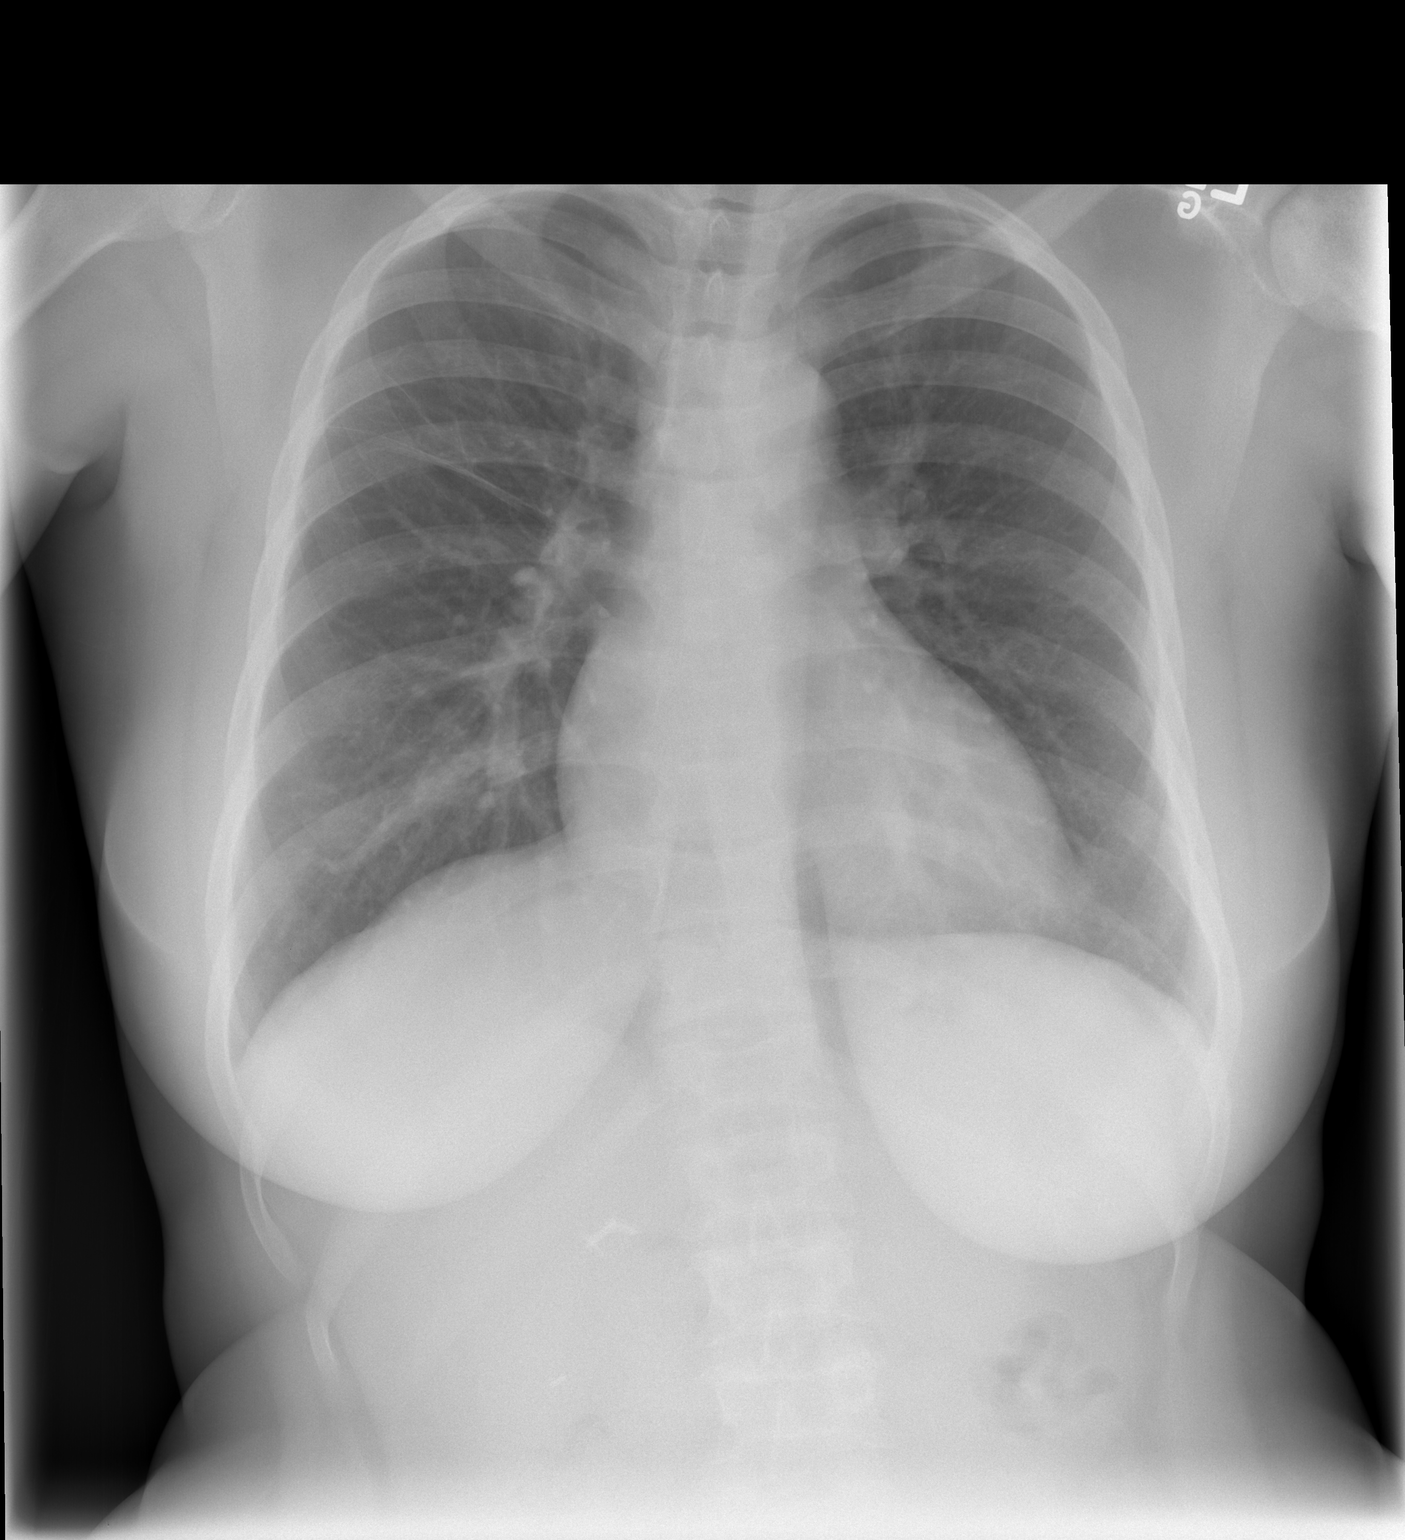

[w chest lat]
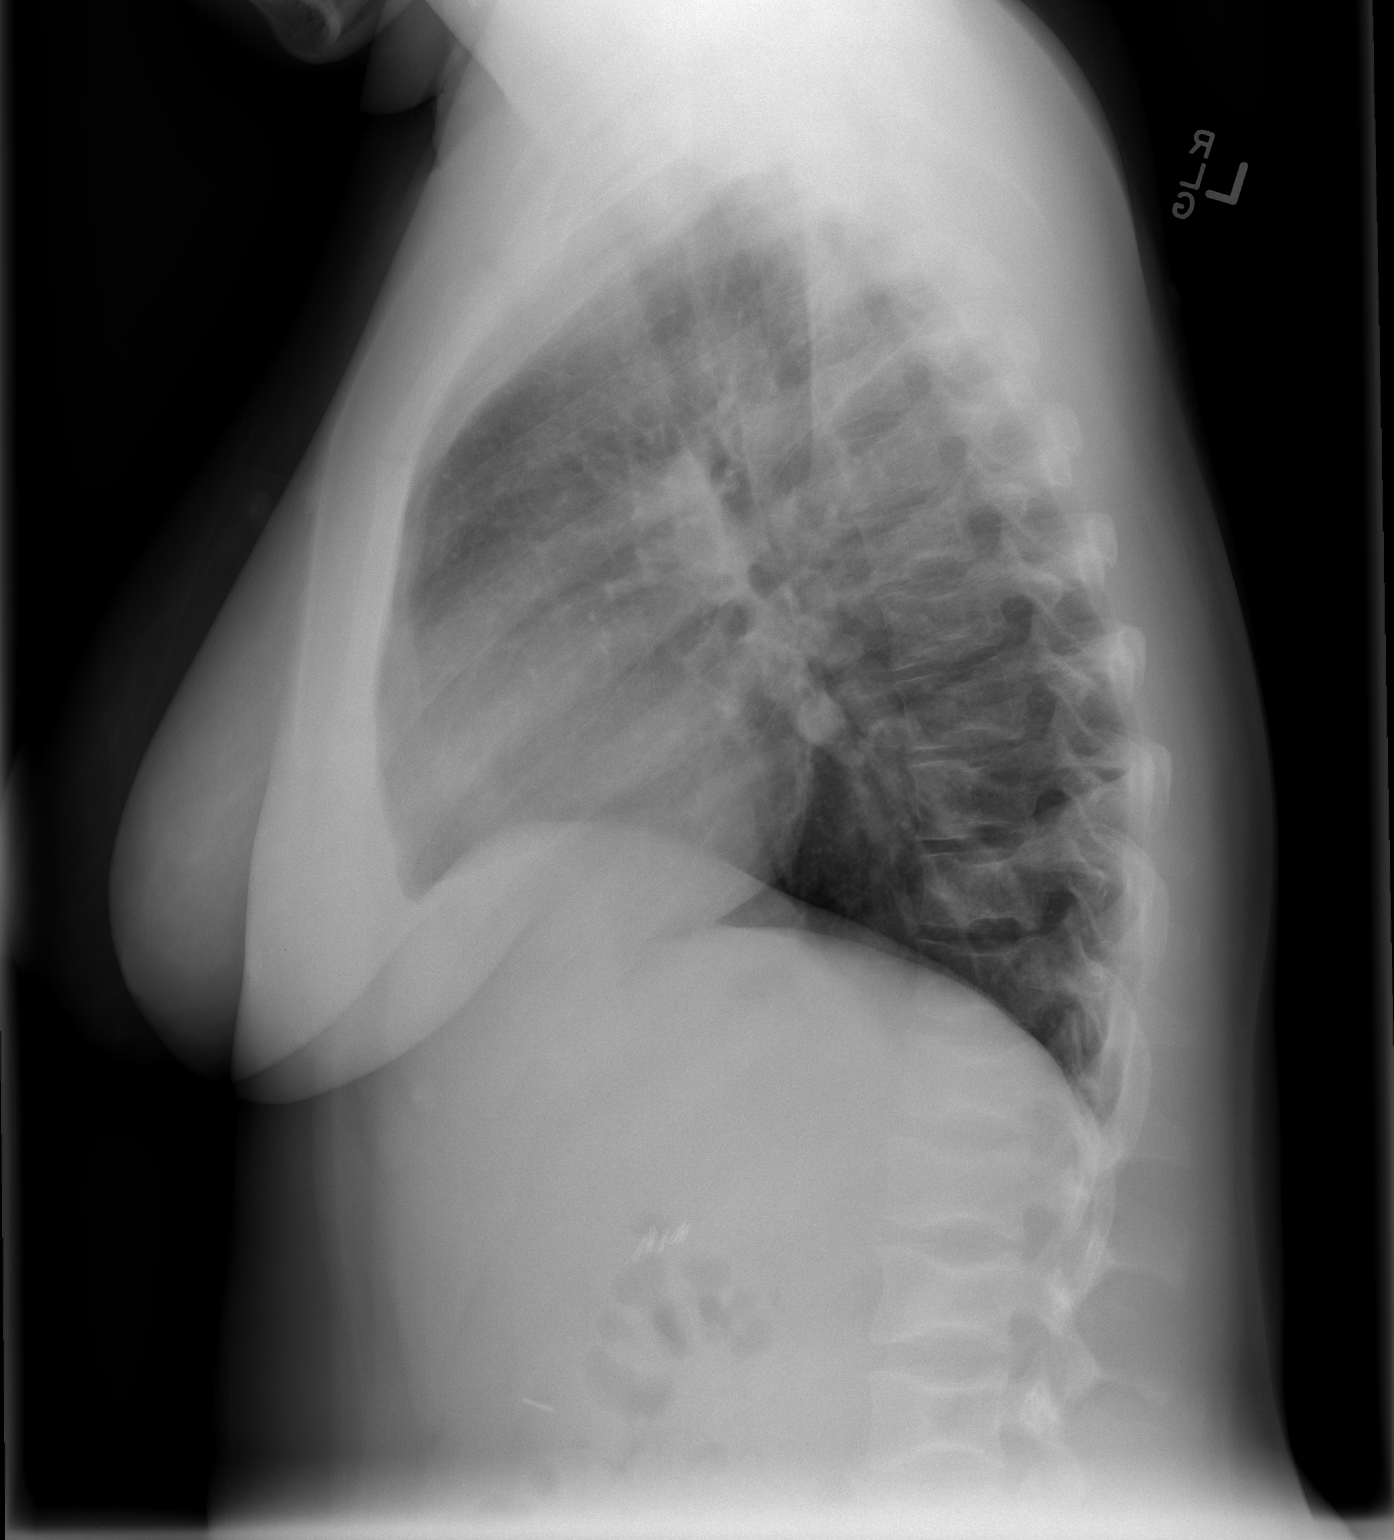

[2 of 2 positions shown; findings below may reference images not displayed]

FINDINGS: Cardiomediastinal silhouette is stable.  Mild thoracic
dextroscoliosis.  No acute infiltrate or edema.  Linear scarring in
the right upper lobe is stable.  Bony thorax is stable.
IMPRESSION: No active disease.  Stable linear scarring in the right upper lobe.

## 2011-07-25 NOTE — Assessment & Plan Note (Signed)
Normal exam currently, but since pt continues to have daily significant right scapular/shoulder muscle pain with no improvement with home exercises, will refer for formal PT at this time.

## 2011-07-25 NOTE — Assessment & Plan Note (Signed)
Pt due for depo- states she wants to change to bCP has appt with health department for evaluation and free bcp rx next week.

## 2011-07-25 NOTE — Assessment & Plan Note (Signed)
Encouraged pt to meet with norma.

## 2011-07-25 NOTE — Assessment & Plan Note (Signed)
Pain well controlled at this time.  Gave rx refill for oxycodone.  Pt to call dr. Mikel Cella and find out what labs she wants drawn, how often, and what number I need to fax them to. Pt thinks the fax number is [phone].  Dr. Algis Downs telephone number is [phone].  Pt to return for lab drawn for monitoring of hydroxyurea once Dr. Mikel Cella has communicated with me about details of lab monitoring.

## 2011-08-04 ENCOUNTER — Observation Stay (HOSPITAL_COMMUNITY)
Admission: EM | Admit: 2011-08-04 | Discharge: 2011-08-05 | Disposition: A | Discharge status: Home / Self Care | Payer: Medicaid Other | Source: Ambulatory Visit | Attending: Family Medicine | Admitting: Family Medicine

## 2011-08-04 ENCOUNTER — Encounter (HOSPITAL_COMMUNITY): Payer: Self-pay | Admitting: Emergency Medicine

## 2011-08-04 DIAGNOSIS — F329 Major depressive disorder, single episode, unspecified: Secondary | ICD-10-CM | POA: Insufficient documentation

## 2011-08-04 DIAGNOSIS — F1120 Opioid dependence, uncomplicated: Secondary | ICD-10-CM

## 2011-08-04 DIAGNOSIS — F3289 Other specified depressive episodes: Secondary | ICD-10-CM | POA: Insufficient documentation

## 2011-08-04 DIAGNOSIS — K219 Gastro-esophageal reflux disease without esophagitis: Secondary | ICD-10-CM | POA: Insufficient documentation

## 2011-08-04 DIAGNOSIS — F17200 Nicotine dependence, unspecified, uncomplicated: Secondary | ICD-10-CM | POA: Insufficient documentation

## 2011-08-04 DIAGNOSIS — R109 Unspecified abdominal pain: Secondary | ICD-10-CM

## 2011-08-04 DIAGNOSIS — D5700 Hb-SS disease with crisis, unspecified: Secondary | ICD-10-CM

## 2011-08-04 NOTE — ED Notes (Signed)
PT. REPORTS SICKLE CELL CRISIS PAIN AT ARMS AND BODY ONSET THIS AFTERNOON WITH NAUSEA.

## 2011-08-05 ENCOUNTER — Encounter (HOSPITAL_COMMUNITY): Payer: Self-pay | Admitting: General Practice

## 2011-08-05 ENCOUNTER — Other Ambulatory Visit: Payer: Self-pay

## 2011-08-05 ENCOUNTER — Emergency Department (HOSPITAL_COMMUNITY): Payer: Medicaid Other

## 2011-08-05 LAB — COMPREHENSIVE METABOLIC PANEL
ALT: 17 U/L (ref 0–35)
AST: 28 U/L (ref 0–37)
Alkaline Phosphatase: 58 U/L (ref 39–117)
CO2: 23 mEq/L (ref 19–32)
Chloride: 105 mEq/L (ref 96–112)
GFR calc Af Amer: >90 mL/min (ref >90)
GFR calc non Af Amer: >90 mL/min (ref >90)
Glucose, Bld: 82 mg/dL (ref 70–99)
Potassium: 3.5 mEq/L (ref 3.5–5.1)
Sodium: 139 mEq/L (ref 135–145)
Total Bilirubin: 1.4 mg/dL — ABNORMAL HIGH (ref 0.3–1.2)

## 2011-08-05 LAB — CBC
Platelets: 442 10*3/uL — ABNORMAL HIGH (ref 150–400)
RBC: 3.02 MIL/uL — ABNORMAL LOW (ref 3.87–5.11)
RDW: 15.4 % (ref 11.5–15.5)
WBC: 10.7 10*3/uL — ABNORMAL HIGH (ref 4.0–10.5)

## 2011-08-05 LAB — URINALYSIS, ROUTINE W REFLEX MICROSCOPIC
Bilirubin Urine: NEGATIVE
Ketones, ur: NEGATIVE mg/dL
Protein, ur: NEGATIVE mg/dL
Specific Gravity, Urine: 1.013 (ref 1.005–1.030)
Urobilinogen, UA: 0.2 mg/dL (ref 0.0–1.0)

## 2011-08-05 LAB — DIFFERENTIAL
Basophils Absolute: 0.1 10*3/uL (ref 0.0–0.1)
Lymphocytes Relative: 37 % (ref 12–46)
Lymphs Abs: 4 10*3/uL (ref 0.7–4.0)
Neutro Abs: 5 10*3/uL (ref 1.7–7.7)

## 2011-08-05 LAB — PROTIME-INR: INR: 1.13 (ref 0.00–1.49)

## 2011-08-05 LAB — URINE MICROSCOPIC-ADD ON

## 2011-08-05 LAB — APTT: aPTT: 33 seconds (ref 24–37)

## 2011-08-05 LAB — POCT PREGNANCY, URINE: Preg Test, Ur: NEGATIVE

## 2011-08-05 LAB — RETICULOCYTES: RBC.: 3.02 MIL/uL — ABNORMAL LOW (ref 3.87–5.11)

## 2011-08-05 MED ORDER — DIPHENHYDRAMINE HCL 50 MG/ML IJ SOLN: 12.5000 mg | Freq: Four times a day (QID) | INTRAMUSCULAR | Status: DC | PRN

## 2011-08-05 MED ORDER — OXYCODONE HCL 5 MG PO TABS
10.0000 mg | ORAL_TABLET | Freq: Four times a day (QID) | ORAL | Status: DC | PRN
  Administered 2011-08-05: 10 mg via ORAL
  Filled 2011-08-05: qty 2

## 2011-08-05 MED ORDER — ONDANSETRON HCL 4 MG/2ML IJ SOLN
4.0000 mg | Freq: Once | INTRAMUSCULAR | Status: AC
  Administered 2011-08-05: 4 mg via INTRAVENOUS

## 2011-08-05 MED ORDER — HYDROMORPHONE HCL PF 1 MG/ML IJ SOLN
1.0000 mg | Freq: Once | INTRAMUSCULAR | Status: AC
  Administered 2011-08-05: 1 mg via INTRAVENOUS
  Filled 2011-08-05: qty 1

## 2011-08-05 MED ORDER — METHOCARBAMOL 500 MG PO TABS
500.0000 mg | ORAL_TABLET | Freq: Every day | ORAL | Status: DC
Start: 2011-08-05 — End: 2011-08-05
  Administered 2011-08-05: 500 mg via ORAL
  Filled 2011-08-05: qty 1

## 2011-08-05 MED ORDER — ONDANSETRON HCL 4 MG/2ML IJ SOLN: 4.0000 mg | Freq: Four times a day (QID) | INTRAMUSCULAR | Status: DC | PRN

## 2011-08-05 MED ORDER — OXYCODONE HCL 10 MG PO TABS: 10.0000 mg | ORAL_TABLET | Freq: Four times a day (QID) | ORAL | Status: DC | PRN

## 2011-08-05 MED ORDER — PROMETHAZINE HCL 25 MG/ML IJ SOLN
12.5000 mg | Freq: Four times a day (QID) | INTRAMUSCULAR | Status: DC | PRN
  Filled 2011-08-05: qty 1

## 2011-08-05 MED ORDER — SUMATRIPTAN SUCCINATE 6 MG/0.5ML ~~LOC~~ SOLN
6.0000 mg | SUBCUTANEOUS | Status: DC | PRN
  Administered 2011-08-05: 6 mg via SUBCUTANEOUS
  Filled 2011-08-05 (×2): qty 0.5

## 2011-08-05 MED ORDER — MORPHINE SULFATE 4 MG/ML IJ SOLN
4.0000 mg | INTRAMUSCULAR | Status: DC | PRN
  Administered 2011-08-05: 4 mg via INTRAVENOUS
  Filled 2011-08-05: qty 1

## 2011-08-05 MED ORDER — METHOCARBAMOL 100 MG/ML IJ SOLN
500.0000 mg | INTRAVENOUS | Status: AC
  Administered 2011-08-05: 500 mg via INTRAVENOUS
  Filled 2011-08-05: qty 5

## 2011-08-05 MED ORDER — OXYCODONE-ACETAMINOPHEN 5-325 MG PO TABS: 1.0000 | ORAL_TABLET | ORAL | Status: DC | PRN

## 2011-08-05 MED ORDER — OXYCODONE-ACETAMINOPHEN 5-325 MG PO TABS: 2.0000 | ORAL_TABLET | ORAL | Status: DC | PRN

## 2011-08-05 MED ORDER — HYDROXYUREA 500 MG PO CAPS
1000.0000 mg | ORAL_CAPSULE | Freq: Every morning | ORAL | Status: DC
  Administered 2011-08-05: 1000 mg via ORAL
  Filled 2011-08-05: qty 2

## 2011-08-05 MED ORDER — HYDROMORPHONE HCL PF 1 MG/ML IJ SOLN
2.0000 mg | INTRAMUSCULAR | Status: DC | PRN
  Administered 2011-08-05 (×2): 2 mg via INTRAVENOUS
  Filled 2011-08-05 (×2): qty 2

## 2011-08-05 MED ORDER — HEPARIN SODIUM (PORCINE) 5000 UNIT/ML IJ SOLN
5000.0000 [IU] | Freq: Three times a day (TID) | INTRAMUSCULAR | Status: DC
  Administered 2011-08-05: 5000 [IU] via SUBCUTANEOUS
  Filled 2011-08-05 (×4): qty 1

## 2011-08-05 MED ORDER — SODIUM CHLORIDE 0.9 % IV SOLN
INTRAVENOUS | Status: DC
  Administered 2011-08-05 (×2): via INTRAVENOUS

## 2011-08-05 MED ORDER — SODIUM CHLORIDE 0.9 % IV BOLUS (SEPSIS)
2000.0000 mL | Freq: Once | INTRAVENOUS | Status: AC
  Administered 2011-08-05: 2000 mL via INTRAVENOUS

## 2011-08-05 MED ORDER — ONDANSETRON HCL 4 MG/2ML IJ SOLN
4.0000 mg | Freq: Four times a day (QID) | INTRAMUSCULAR | Status: DC | PRN
  Administered 2011-08-05: 4 mg via INTRAVENOUS
  Filled 2011-08-05 (×2): qty 2

## 2011-08-05 MED ORDER — METHOCARBAMOL 100 MG/ML IJ SOLN: 500.0000 mg | Freq: Once | INTRAMUSCULAR | Status: DC

## 2011-08-05 MED ORDER — PANTOPRAZOLE SODIUM 40 MG PO TBEC: 40.0000 mg | DELAYED_RELEASE_TABLET | Freq: Every day | ORAL | Status: DC

## 2011-08-05 NOTE — Discharge Summary (Signed)
Family Medicine Teaching Service DISCHARGE SUMMARY   Patient name: [name] Medical record number: [id number] Date of birth: 03-Jan-1991 Age: 21 y.o. Gender: female  Attending Physician: No att. providers found Primary Care Provider: [name], MD, MD Consultants: None  Dates of Hospitalization:  08/04/2011 to 08/06/2011 Length of Stay: 1 days  Admission Diagnoses:  Sickle Cell Pain Crisis  Discharge Diagnoses:  Active Problems:  * No active hospital problems. *   Patient Active Problem List  Diagnoses Date Noted  . Overweight 05/24/2011  . Stress 04/29/2011  . Contraception management 03/01/2011  . GERD (gastroesophageal reflux disease) 02/17/2011  . Depression 01/06/2011  . Back pain 09/17/2010  . Active smoker 08/09/2010  . SICKLE CELL ANEMIA 01/08/2009  . 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 year old female who presented with a pain crisis in her B hips set off by standing next to an open window with cold air entering.  She was reported to have increased stress due to her job and was out of pain medications at the time of evaluation.  She was briefly admitted after failing CDU pain control.  Upon arrival to the floor her pain was much improved with her oral home pain regimen and the pt was requesting to go home.  She was provided 60 tablets of oxycodone IR 10mg  and was ensured follow up with her PCP.     Significant Diagnostics:   LABS:  Lab 08/05/11 0035  WBC 10.7*  HGB 9.9*  HCT 27.3*  PLT 442*    Lab 08/05/11 0035  NA 139  K 3.5  CL 105  CO2 23  BUN 10  CREATININE 0.56  CALCIUM 9.5  GLUCOSE 82    Metabolic:  Lab 08/05/11 0035  ALT 17  AST 28  ALKPHOS 58  BILITOT 1.4*  BILIDIR --    Lab 08/05/11 0035  PROT 7.3  ALBUMIN 4.1  PREALBUMIN --   Hematology:  Lab 08/05/11 0035  LYMPHSABS 4.0  MONOABS 1.2*  EOSABS 0.4  BASOSABS 0.1    Lab 08/05/11 0035  RDW 15.4  MCV 90.4    MCHC 36.3*  MRET 332.2*    Lab 08/05/11 0035  INR 1.13    Lab 08/05/11 0105  PREGTESTUR NEGATIVE  PREGSERUM --  HCG --  HCGQUANT --    MICRO: Urinalysis  Lab 08/05/11 0050  COLORURINE YELLOW  APPEARANCEUR CLEAR  LABSPEC 1.013  PHURINE 6.0  GLUCOSEU NEGATIVE  HGBUR SMALL*  BILIRUBINUR NEGATIVE  KETONESUR NEGATIVE  PROTEINUR NEGATIVE  UROBILINOGEN 0.2  NITRITE NEGATIVE  LEUKOCYTESUR NEGATIVE   IMAGING: CX 3/22 No acute cardiopulmonary process seen; mild nonspecific thickening  of the right minor fissure likely remains within normal limits.    Procedures:   none   Discharge Information   Disposition: 01-Home or Self Care Discharge Diet: Resume diet Discharge Condition:  Improved Discharge Activity: Ad lib Medication List  As of 08/06/2011  4:41 PM   TAKE these medications         folic acid 1 MG tablet   Commonly known as: FOLVITE   Take 1 mg by mouth daily.      hydroxyurea 500 MG capsule   Commonly known as: HYDREA   Take 1,000 mg by mouth every morning. May take with food to minimize GI side effects.      methocarbamol 500 MG tablet   Commonly known as: ROBAXIN   Take 500 mg by mouth  daily.      Oxycodone HCl 10 MG Tabs   Take 1 tablet (10 mg total) by mouth 4 (four) times daily as needed. pain      pantoprazole 40 MG tablet   Commonly known as: PROTONIX   Take 40 mg by mouth daily at 12 noon.      prenatal multivitamin 60-1 MG tablet   Take 1 tablet by mouth every morning.            Follow Up Issues and Recommendations:    Discharge Orders    Future Appointments: Provider: Department: Dept Phone: Center:   08/22/2011 11:00 AM Dawn Wilson Singer, MD [facility] [phone] [facility]     Future Orders Please Complete By Expires   Diet - low sodium heart healthy      Increase activity slowly        Follow-up Information    Follow up with [name], MD on 08/22/2011. (@1100  AM)         Pending Results: none   Issues and  Medications to address: Chronic Pain  Pt provided 60 of Oxy IR at time of d/c   Consider referral for OMT with either Dr. Katrinka Blazing or myself for her R shoulder pain.  She would likely benefit from manipulation for her MSK issues.    Andrena Mews, DO [facility] Res[facility] - PGY-1 08/06/2011 4:41 PM

## 2011-08-05 NOTE — H&P (Signed)
[name] is an 21 y.o. female.   Chief Complaint: sickle cell pain crisis HPI: This is a 21 year old African-American female with a history of sickle cell disease presenting with presumed pain crisis. She has been working more hours at her job and exercising more, and she thinks that this on top of being exposed to cold weather today at work (she was working next to a window) set off her pain crisis. She has been working more, trying to afford a new apartment.  She denies taking any pain medication at home. At first she denied having any, then she said that when she started to have pain, she came to the ED before taking any of her home narcotic. She reports pain at her sides.  She is also complaining of frontal headache associated with photophobia and nausea/vomiting sputum. She usually takes Excedrin migraine for this pain but has not taken any today.   Past Medical History  Diagnosis Date  . H/O: 1 miscarriage 03/22/2011  . 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 Active smoker 08/09/2010  . Depression 01/06/2011  . GERD (gastroesophageal reflux disease) 02/17/2011  . Trichotillomania     h/o  . Blood transfusion   . Sickle cell anemia with crisis     Past Surgical History  Procedure Date  . Cholecystectomy 05/2010  . Dilation and curettage of uterus 02/20/11    No family history on file. Social History:  reports that she has quit smoking. She has never used smokeless tobacco. She reports that she does not drink alcohol or use illicit drugs.  Allergies:  Allergies  Allergen Reactions  . Toradol Hives and Itching    Medications Prior to Admission  Medication Dose Route Frequency Provider Last Rate Last Dose  . 0.9 %  sodium chloride infusion   Intravenous Continuous Felisa Bonier, MD 125 mL/hr at 08/05/11 0105    . diphenhydrAMINE (BENADRYL) injection 12.5 mg  12.5 mg Intravenous Q6H PRN Felisa Bonier, MD      . HYDROmorphone (DILAUDID) injection 1 mg  1 mg Intravenous Once  Felisa Bonier, MD   1 mg at 08/05/11 0253  . HYDROmorphone (DILAUDID) injection 2 mg  2 mg Intravenous Q2H PRN Felisa Bonier, MD   2 mg at 08/05/11 0105  . ondansetron (ZOFRAN) injection 4 mg  4 mg Intravenous Q6H PRN Felisa Bonier, MD   4 mg at 08/05/11 0105  . ondansetron (ZOFRAN) injection 4 mg  4 mg Intravenous Q6H PRN Shelly Rubenstein, MD      . oxyCODONE-acetaminophen [facility]) 5-325 MG per tablet 2 tablet  2 tablet Oral Q4H PRN Felisa Bonier, MD      . sodium chloride 0.9 % bolus 2,000 mL  2,000 mL Intravenous Once Felisa Bonier, MD   2,000 mL at 08/05/11 0256  . SUMAtriptan (IMITREX) injection 6 mg  6 mg Subcutaneous Q2H PRN Shelly Rubenstein, MD       Medications Prior to Admission  Medication Sig Dispense Refill  . folic acid (FOLVITE) 1 MG tablet Take 1 mg by mouth daily.      . hydroxyurea (HYDREA) 500 MG capsule Take 1,000 mg by mouth every morning. May take with food to minimize GI side effects.      . Oxycodone HCl 10 MG TABS Take 1 tablet (10 mg total) by mouth 4 (four) times daily as needed. pain  28 each  0  . pantoprazole (PROTONIX) 40 MG tablet Take 40 mg  by mouth daily at 12 noon.      . Prenatal Vit-Fe Fumarate-FA (PRENATAL MULTIVITAMIN) 60-1 MG tablet Take 1 tablet by mouth every morning.        Results for orders placed during the hospital encounter of 08/04/11 (from the past 48 hour(s))  CBC     Status: Abnormal   Collection Time   08/05/11 12:35 AM      Component Value Range Comment   WBC 10.7 (*) 4.0 - 10.5 (K/uL)    RBC 3.02 (*) 3.87 - 5.11 (MIL/uL)    Hemoglobin 9.9 (*) 12.0 - 15.0 (g/dL)    HCT 40.9 (*) 81.1 - 46.0 (%)    MCV 90.4  78.0 - 100.0 (fL)    MCH 32.8  26.0 - 34.0 (pg)    MCHC 36.3 (*) 30.0 - 36.0 (g/dL)    RDW 91.4  78.2 - 95.6 (%)    Platelets 442 (*) 150 - 400 (K/uL)   DIFFERENTIAL     Status: Abnormal   Collection Time   08/05/11 12:35 AM      Component Value Range Comment   Neutrophils Relative 47  43 - 77 (%)    Neutro Abs  5.0  1.7 - 7.7 (K/uL)    Lymphocytes Relative 37  12 - 46 (%)    Lymphs Abs 4.0  0.7 - 4.0 (K/uL)    Monocytes Relative 11  3 - 12 (%)    Monocytes Absolute 1.2 (*) 0.1 - 1.0 (K/uL)    Eosinophils Relative 4  0 - 5 (%)    Eosinophils Absolute 0.4  0.0 - 0.7 (K/uL)    Basophils Relative 1  0 - 1 (%)    Basophils Absolute 0.1  0.0 - 0.1 (K/uL)   COMPREHENSIVE METABOLIC PANEL     Status: Abnormal   Collection Time   08/05/11 12:35 AM      Component Value Range Comment   Sodium 139  135 - 145 (mEq/L)    Potassium 3.5  3.5 - 5.1 (mEq/L)    Chloride 105  96 - 112 (mEq/L)    CO2 23  19 - 32 (mEq/L)    Glucose, Bld 82  70 - 99 (mg/dL)    BUN 10  6 - 23 (mg/dL)    Creatinine, Ser 2.13  0.50 - 1.10 (mg/dL)    Calcium 9.5  8.4 - 10.5 (mg/dL)    Total Protein 7.3  6.0 - 8.3 (g/dL)    Albumin 4.1  3.5 - 5.2 (g/dL)    AST 28  0 - 37 (U/L)    ALT 17  0 - 35 (U/L)    Alkaline Phosphatase 58  39 - 117 (U/L)    Total Bilirubin 1.4 (*) 0.3 - 1.2 (mg/dL)    GFR calc non Af Amer >90  >90 (mL/min)    GFR calc Af Amer >90  >90 (mL/min)   RETICULOCYTES     Status: Abnormal   Collection Time   08/05/11 12:35 AM      Component Value Range Comment   Retic Ct Pct 11.0 (*) 0.4 - 3.1 (%)    RBC. 3.02 (*) 3.87 - 5.11 (MIL/uL)    Retic Count, Manual 332.2 (*) 19.0 - 186.0 (K/uL)   PROTIME-INR     Status: Normal   Collection Time   08/05/11 12:35 AM      Component Value Range Comment   Prothrombin Time 14.7  11.6 - 15.2 (seconds)    INR 1.13  0.00 - 1.49    APTT     Status: Normal   Collection Time   08/05/11 12:35 AM      Component Value Range Comment   aPTT 33  24 - 37 (seconds)   URINALYSIS, ROUTINE W REFLEX MICROSCOPIC     Status: Abnormal   Collection Time   08/05/11 12:50 AM      Component Value Range Comment   Color, Urine YELLOW  YELLOW     APPearance CLEAR  CLEAR     Specific Gravity, Urine 1.013  1.005 - 1.030     pH 6.0  5.0 - 8.0     Glucose, UA NEGATIVE  NEGATIVE (mg/dL)    Hgb urine  dipstick SMALL (*) NEGATIVE     Bilirubin Urine NEGATIVE  NEGATIVE     Ketones, ur NEGATIVE  NEGATIVE (mg/dL)    Protein, ur NEGATIVE  NEGATIVE (mg/dL)    Urobilinogen, UA 0.2  0.0 - 1.0 (mg/dL)    Nitrite NEGATIVE  NEGATIVE     Leukocytes, UA NEGATIVE  NEGATIVE    URINE MICROSCOPIC-ADD ON     Status: Normal   Collection Time   08/05/11 12:50 AM      Component Value Range Comment   Squamous Epithelial / LPF RARE  RARE     WBC, UA 0-2  <3 (WBC/hpf)    RBC / HPF 0-2  <3 (RBC/hpf)    Bacteria, UA RARE  RARE    POCT PREGNANCY, URINE     Status: Normal   Collection Time   08/05/11  1:05 AM      Component Value Range Comment   Preg Test, Ur NEGATIVE  NEGATIVE     Dg Chest 1 View  08/05/2011  *RADIOLOGY REPORT*  Clinical Data: Pain under the rest and along the abdomen; history of sickle cell disease.  CHEST - 1 VIEW  Comparison: Chest radiograph performed 01/11/2012  Findings: The lungs are well-aerated.  There is mild thickening of the right minor fissure, nonspecific in appearance.  There is no evidence of focal opacification, pleural effusion or pneumothorax.  The cardiomediastinal silhouette is borderline normal in size.  No acute osseous abnormalities are seen; chronic vertebral body changes reflect the patient's sickle cell disease.  Clips are noted within the right upper quadrant, reflecting prior cholecystectomy.  IMPRESSION: No acute cardiopulmonary process seen; mild nonspecific thickening of the right minor fissure likely remains within normal limits.  Original Report Authenticated By: Tonia Ghent, M.D.    ROS Per HPI with inclusion of following: Denies fevers, chills, cough, nasal congestion Denies chest pain, palpitations Denies difficulty breathing Denies abdominal pain including diarrhea Denies weakness in her extremities or abnormal movements/gait Denies dysuria/frequency/urgency  Blood pressure 94/51, pulse 101, temperature 97.6 F (36.4 C), temperature source Oral, resp.  rate 14, last menstrual period 06/29/2011, SpO2 96.00%. Physical Exam  Gen: appears uncomfortable, tearful, boyfriend sleeping on floor beside patient Psych: alert and oriented; appropriate to questions HEENT: normal conjunctiva, no nasal congestion/rhinorrhea, palpable lymph node right submentum CV: RRR, ?2/6 systolic murmur RUSB Pulm: NI WOB, CTAB withotu w/r/r Abd: NABS, soft, NT, ND Ext: no edema MSK: TTP lateral abdomen, over fatty tissue, no guarding or rebound; no suprapubic and anterior abdominal tenderness Neuro: no focal deficits; moves all extremities  Assessment/Plan This is a 21 year old African-American female with a history of sickle cell disease presenting with bilateral side pain:  Patient reports pain similar to sickle cell pain, however, Hgb above baseline, vital stable, and CXR  without infiltrates. Elevated reticulate count at 11. Patient did not take her pain medications at home.  Patient appears uncomfortable and tearful right now. Working longer hours at work and increased physical activity (exercise) may be contributing to her anxiety and concern regarding crisis. The patient also remarked how she did not have a crisis "in a while" (1-2 months).  -Will treat pain with home oxycodone with breakthrough percocet/morphine. Continue hydroxyurea.  -Another etiology may be muscle spasm. K-pad and home Robaxin.  -Migraine may be exacerbating pain perception. Will treat with Imitrex. Zofran prn nausea.  -Regarding stress, PCP aware and working with our CSW. Will notify [facility] of patient's hospitalization.  -NS @ 125. Diet: heart healthy -PPx: heparin SQ  Disposition: pending clinical improvement. Anticipate discharge to home later this afternoon if pain is adequately controlled on oral pain medication.    [place] PARK, Gaurav Baldree 08/05/2011, 5:02 AM

## 2011-08-05 NOTE — ED Notes (Signed)
Attempted to call report x1 - RN unavailable at present.  

## 2011-08-05 NOTE — ED Notes (Signed)
Pt reports acute onset of bilat side pain that began while pt was at work yesterday approx 1800 - pt also admits to episodes of dizziness and nausea - denies chest pain, shortness of breath, or vomiting. Pt in no acute distress on assessment, pillow provided per pt request, friend at bedside.

## 2011-08-05 NOTE — Progress Notes (Signed)
Discharge patient home. Patient verbalized understanding of discharge. Presciption given to patient by MD. No question or concerns verbalized. Skin Intact

## 2011-08-05 NOTE — H&P (Signed)
I interviewed and examined this patient and discussed the care plan with Dr. Madolyn Frieze and the [facility] team and agree with assessment and plan as documented in the admission note for today. Sumitryptan may be relatively contraindicated in sickle cell crisis. We will watch carefully for signs of acute chest syndrome.    Maloree Uplinger A. Sheffield Slider, MD Family Medicine Teaching Service Attending  08/05/2011 8:49 AM

## 2011-08-07 NOTE — ED Provider Notes (Signed)
History     CSN: [id number]  Arrival date & time 08/04/11  2317   First MD Initiated Contact with Patient 08/05/11 0020      Chief Complaint  Patient presents with  . Sickle Cell Pain Crisis    (Consider location/radiation/quality/duration/timing/severity/associated sxs/prior treatment) Patient is a 21 y.o. female presenting with sickle cell pain. The history is provided by the patient and medical records.  Sickle Cell Pain Crisis  This is a recurrent problem. The current episode started today. The onset was gradual. The problem occurs continuously. The problem has been gradually worsening. The pain is associated with recent physical stress (recent increase in physical activity and exercise). The pain location is generalized. Site of pain is localized in muscle and a joint. The pain is similar to prior episodes. The pain is severe. The symptoms are relieved by one or more prescription drugs. The symptoms are not relieved by acetaminophen and ibuprofen. The symptoms are aggravated by activity and movement. Pertinent negatives include no chest pain, no blurred vision, no double vision, no photophobia, no abdominal pain, no constipation, no diarrhea, no nausea, no vomiting, no dysuria, no hematuria, no vaginal bleeding, no vaginal discharge, no congestion, no ear pain, no headaches, no rhinorrhea, no sore throat, no swollen glands, no back pain, no joint pain, no neck pain, no neck stiffness, no loss of sensation, no tingling, no weakness, no cough, no difficulty breathing, no rash and no eye pain. There is no swelling present. She has been behaving normally. She has been eating and drinking normally. Urine output has been normal. She sickle cell type is SS. There is no history of acute chest syndrome. There have been frequent pain crises. There is no history of platelet sequestration. There is no history of stroke. She has not been treated with chronic transfusion therapy. She has been treated with  hydroxyurea. There were no sick contacts. Recently, medical care has been given at this facility. Services received include medications given and tests performed.    Past Medical History  Diagnosis Date  . H/O: 1 miscarriage 03/22/2011  . 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 Active smoker 08/09/2010  . Depression 01/06/2011  . GERD (gastroesophageal reflux disease) 02/17/2011  . Trichotillomania     h/o  . Blood transfusion   . Sickle cell anemia with crisis     Past Surgical History  Procedure Date  . Cholecystectomy 05/2010  . Dilation and curettage of uterus 02/20/11    History reviewed. No pertinent family history.  History  Substance Use Topics  . Smoking status: Former Smoker    Quit date: 07/15/2011  . Smokeless tobacco: Never Used  . Alcohol Use: No    OB History    Grav Para Term Preterm Abortions TAB SAB Ect Mult Living   1    1           Review of Systems  HENT: Negative for ear pain, congestion, sore throat, rhinorrhea and neck pain.   Eyes: Negative for blurred vision, double vision, photophobia and pain.  Respiratory: Negative for cough.   Cardiovascular: Negative for chest pain.  Gastrointestinal: Negative for nausea, vomiting, abdominal pain, diarrhea and constipation.  Genitourinary: Negative for dysuria, hematuria, vaginal bleeding and vaginal discharge.  Musculoskeletal: Negative for back pain and joint pain.  Skin: Negative for rash.  Neurological: Negative for tingling, weakness and headaches.  All other systems reviewed and are negative.    Allergies  Toradol  Home Medications   Current Outpatient Rx  Name  Route Sig Dispense Refill  . FOLIC ACID 1 MG PO TABS Oral Take 1 mg by mouth daily.    Marland Kitchen HYDROXYUREA 500 MG PO CAPS Oral Take 1,000 mg by mouth every morning. May take with food to minimize GI side effects.    . METHOCARBAMOL 500 MG PO TABS Oral Take 500 mg by mouth daily.    Marland Kitchen PANTOPRAZOLE SODIUM 40 MG PO TBEC Oral Take 40 mg by mouth daily at  12 noon.    Marland Kitchen PRENATAL RX 60-1 MG PO TABS Oral Take 1 tablet by mouth every morning.    . OXYCODONE HCL 10 MG PO TABS Oral Take 1 tablet (10 mg total) by mouth 4 (four) times daily as needed. pain 60 each 0    BP 98/63  Pulse 94  Temp(Src) 98.3 F (36.8 C) (Oral)  Resp 18  SpO2 96%  LMP 06/29/2011  Physical Exam  Nursing note and vitals reviewed. Constitutional: She is oriented to person, place, and time. She appears well-developed and well-nourished. No distress.  HENT:  Head: Normocephalic and atraumatic.  Mouth/Throat: Oropharynx is clear and moist.  Eyes: Conjunctivae and EOM are normal.  Neck: Normal range of motion.  Cardiovascular: Normal rate, regular rhythm, normal heart sounds and intact distal pulses.  Exam reveals no gallop and no friction rub.   No murmur heard. Pulmonary/Chest: Effort normal and breath sounds normal. No respiratory distress. She has no wheezes. She has no rales. She exhibits no tenderness.  Abdominal: Soft. Bowel sounds are normal. She exhibits no distension. There is no tenderness. There is no rebound and no guarding.  Musculoskeletal: Normal range of motion. She exhibits no edema and no tenderness.  Neurological: She is alert and oriented to person, place, and time. She has normal reflexes. No cranial nerve deficit. She exhibits normal muscle tone. Coordination normal.  Skin: Skin is warm and dry. No rash noted. She is not diaphoretic. No erythema. No pallor.    ED Course  Procedures (including critical care time)  Labs Reviewed  CBC - Abnormal; Notable for the following:    WBC 10.7 (*)    RBC 3.02 (*)    Hemoglobin 9.9 (*)    HCT 27.3 (*)    MCHC 36.3 (*)    Platelets 442 (*)    All other components within normal limits  DIFFERENTIAL - Abnormal; Notable for the following:    Monocytes Absolute 1.2 (*)    All other components within normal limits  COMPREHENSIVE METABOLIC PANEL - Abnormal; Notable for the following:    Total Bilirubin 1.4  (*)    All other components within normal limits  URINALYSIS, ROUTINE W REFLEX MICROSCOPIC - Abnormal; Notable for the following:    Hgb urine dipstick SMALL (*)    All other components within normal limits  RETICULOCYTES - Abnormal; Notable for the following:    Retic Ct Pct 11.0 (*)    RBC. 3.02 (*)    Retic Count, Manual 332.2 (*)    All other components within normal limits  PROTIME-INR  APTT  POCT PREGNANCY, URINE  URINE MICROSCOPIC-ADD ON  LAB REPORT - SCANNED   No results found.   1. Sickle cell anemia with crisis      Apparent Sickle cell pain crisis without relief after multiple dose parenteral pain medications        Felisa Bonier, MD 08/07/11 2339

## 2011-08-07 NOTE — Discharge Summary (Signed)
I discussed with Dr Berline Chough.  I agree with their plans documented in their dioscharge note.

## 2011-08-09 IMAGING — CR DG CHEST 2V
2 series · 2 of 2 positions shown · non-contrast
Comparison: 09/09/2010

CLINICAL DATA: Right lower back pain, flank pain, groin pain, and
right leg pain.

CHEST - 2 VIEW

[w chest pa]
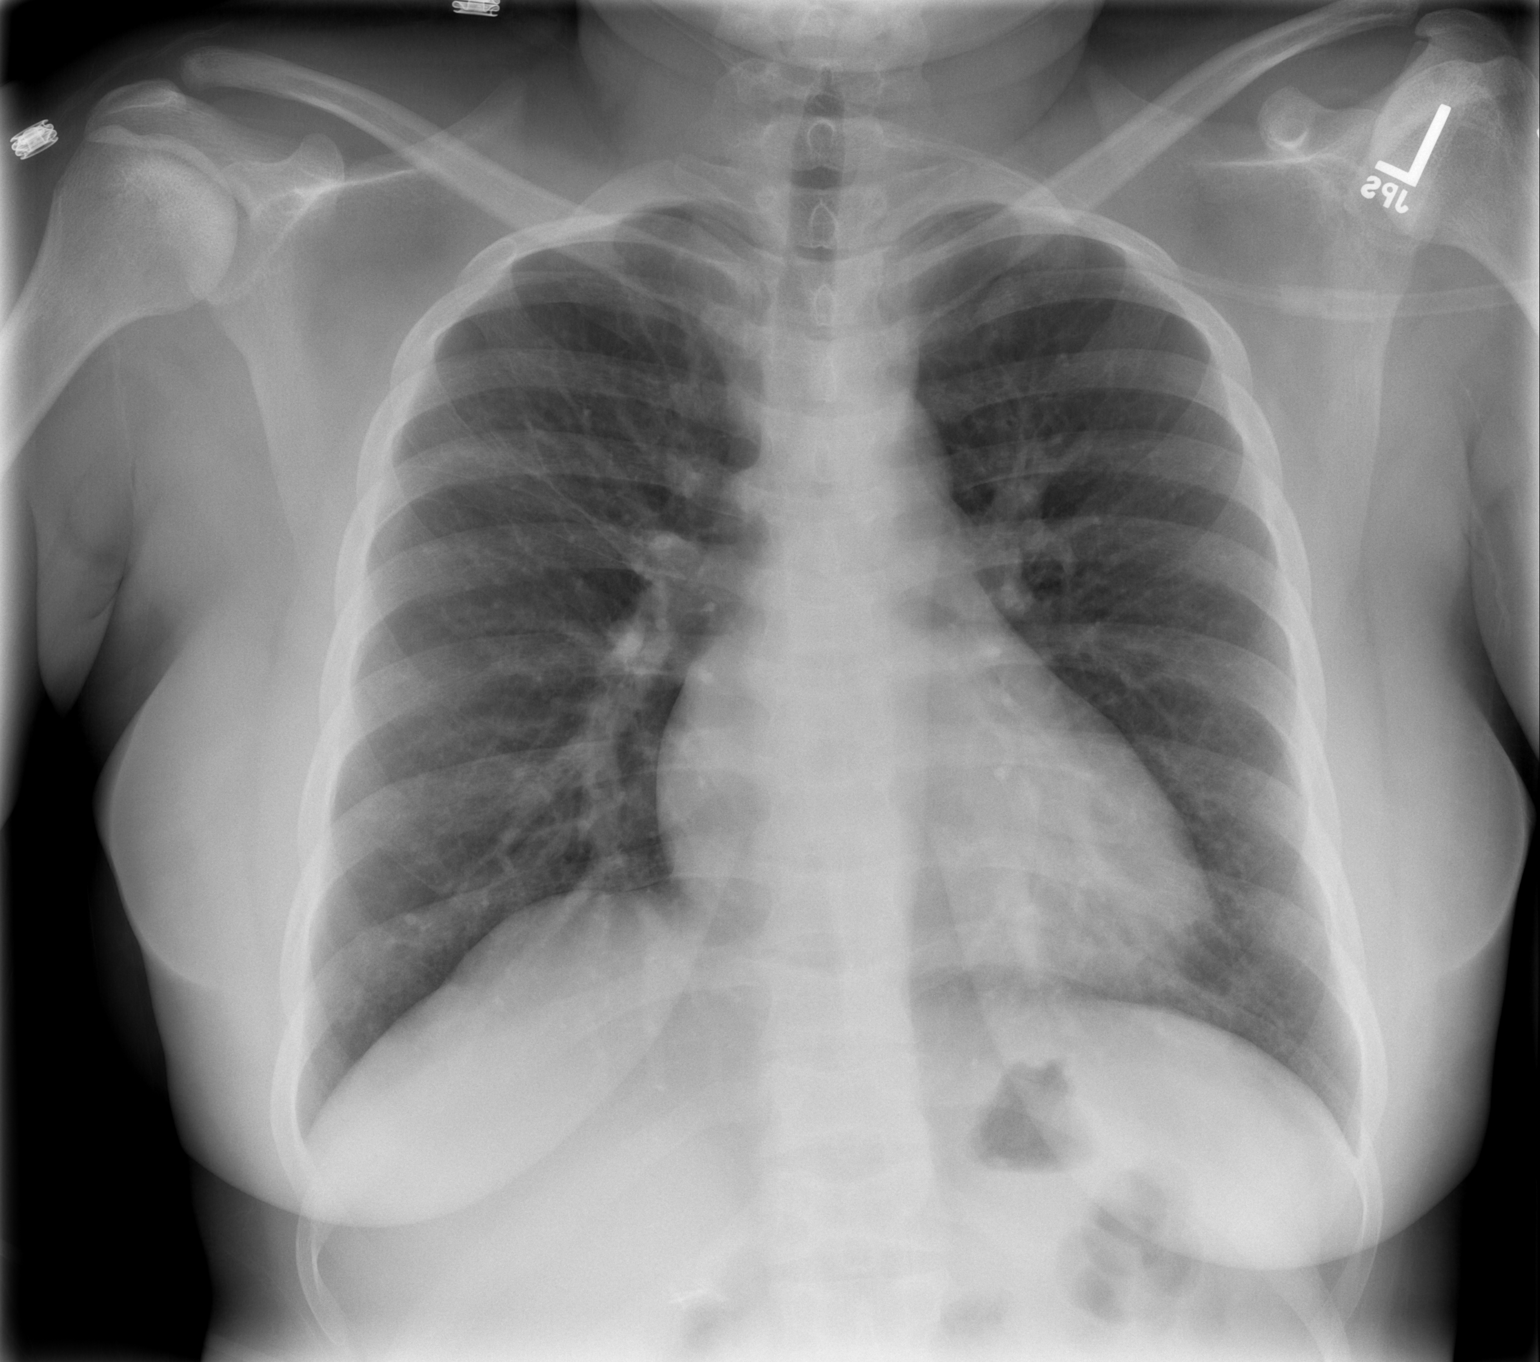

[w chest lat]
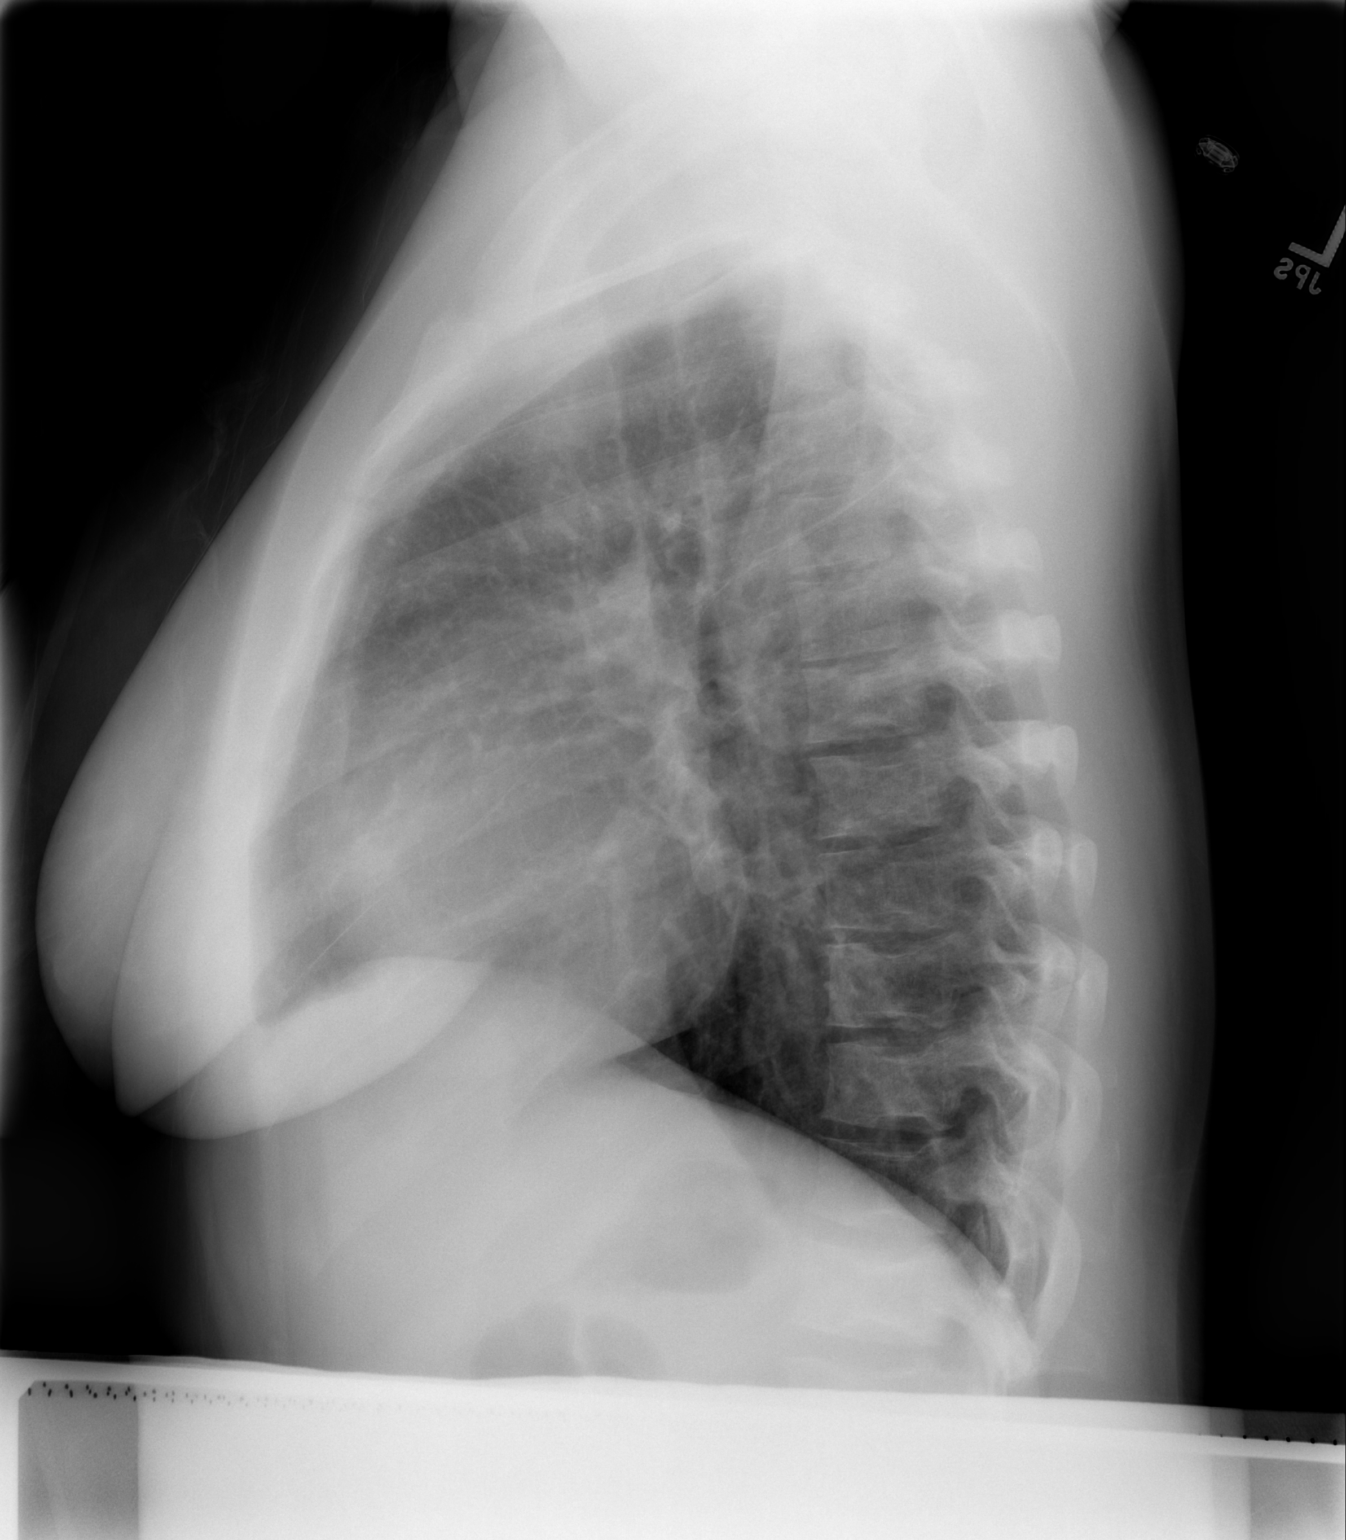

[2 of 2 positions shown; findings below may reference images not displayed]

FINDINGS: The heart size and pulmonary vascularity are normal. The
lungs appear clear and expanded without focal air space disease or
consolidation. No blunting of the costophrenic angles.  Surgical
clips right upper quadrant.  No significant change since prior
study.
IMPRESSION: No evidence of active pulmonary disease.

## 2011-08-11 ENCOUNTER — Ambulatory Visit: Payer: Medicaid Other | Attending: Family Medicine

## 2011-08-11 DIAGNOSIS — R293 Abnormal posture: Secondary | ICD-10-CM | POA: Insufficient documentation

## 2011-08-11 DIAGNOSIS — M2550 Pain in unspecified joint: Secondary | ICD-10-CM | POA: Insufficient documentation

## 2011-08-11 DIAGNOSIS — M2560 Stiffness of unspecified joint, not elsewhere classified: Secondary | ICD-10-CM | POA: Insufficient documentation

## 2011-08-11 DIAGNOSIS — IMO0001 Reserved for inherently not codable concepts without codable children: Secondary | ICD-10-CM | POA: Insufficient documentation

## 2011-08-12 ENCOUNTER — Encounter (HOSPITAL_COMMUNITY): Payer: Self-pay | Admitting: Emergency Medicine

## 2011-08-12 ENCOUNTER — Inpatient Hospital Stay (HOSPITAL_COMMUNITY)
Admission: AD | Admit: 2011-08-12 | Discharge: 2011-08-17 | DRG: 812 | Disposition: A | Discharge status: Home / Self Care | Payer: Medicaid Other | Attending: Internal Medicine | Admitting: Internal Medicine

## 2011-08-12 ENCOUNTER — Emergency Department (HOSPITAL_COMMUNITY)
Admission: EM | Admit: 2011-08-12 | Discharge: 2011-08-12 | Disposition: A | Discharge status: Home / Self Care | Payer: Medicaid Other | Source: Home / Self Care | Attending: Emergency Medicine | Admitting: Emergency Medicine

## 2011-08-12 DIAGNOSIS — F6389 Other impulse disorders: Secondary | ICD-10-CM | POA: Diagnosis present

## 2011-08-12 DIAGNOSIS — Z9089 Acquired absence of other organs: Secondary | ICD-10-CM | POA: Insufficient documentation

## 2011-08-12 DIAGNOSIS — F429 Obsessive-compulsive disorder, unspecified: Secondary | ICD-10-CM | POA: Diagnosis present

## 2011-08-12 DIAGNOSIS — K5900 Constipation, unspecified: Secondary | ICD-10-CM | POA: Diagnosis present

## 2011-08-12 DIAGNOSIS — M545 Low back pain, unspecified: Secondary | ICD-10-CM | POA: Diagnosis present

## 2011-08-12 DIAGNOSIS — D5700 Hb-SS disease with crisis, unspecified: Principal | ICD-10-CM | POA: Diagnosis present

## 2011-08-12 DIAGNOSIS — F909 Attention-deficit hyperactivity disorder, unspecified type: Secondary | ICD-10-CM | POA: Diagnosis present

## 2011-08-12 DIAGNOSIS — F329 Major depressive disorder, single episode, unspecified: Secondary | ICD-10-CM | POA: Diagnosis present

## 2011-08-12 DIAGNOSIS — F3289 Other specified depressive episodes: Secondary | ICD-10-CM | POA: Diagnosis present

## 2011-08-12 DIAGNOSIS — F411 Generalized anxiety disorder: Secondary | ICD-10-CM | POA: Diagnosis present

## 2011-08-12 DIAGNOSIS — Z87891 Personal history of nicotine dependence: Secondary | ICD-10-CM

## 2011-08-12 DIAGNOSIS — D571 Sickle-cell disease without crisis: Secondary | ICD-10-CM

## 2011-08-12 DIAGNOSIS — K219 Gastro-esophageal reflux disease without esophagitis: Secondary | ICD-10-CM | POA: Diagnosis present

## 2011-08-12 LAB — DIFFERENTIAL
Basophils Relative: 0 % (ref 0–1)
Lymphocytes Relative: 26 % (ref 12–46)
Monocytes Relative: 11 % (ref 3–12)
Neutro Abs: 7.6 10*3/uL (ref 1.7–7.7)
Neutrophils Relative %: 60 % (ref 43–77)

## 2011-08-12 LAB — CBC
HCT: 26.3 % — ABNORMAL LOW (ref 36.0–46.0)
Hemoglobin: 9.3 g/dL — ABNORMAL LOW (ref 12.0–15.0)
MCHC: 35.4 g/dL (ref 30.0–36.0)
RBC: 2.82 MIL/uL — ABNORMAL LOW (ref 3.87–5.11)
WBC: 12.5 10*3/uL — ABNORMAL HIGH (ref 4.0–10.5)

## 2011-08-12 LAB — COMPREHENSIVE METABOLIC PANEL
AST: 27 U/L (ref 0–37)
Albumin: 3.9 g/dL (ref 3.5–5.2)
Alkaline Phosphatase: 58 U/L (ref 39–117)
BUN: 10 mg/dL (ref 6–23)
CO2: 25 mEq/L (ref 19–32)
Chloride: 104 mEq/L (ref 96–112)
GFR calc non Af Amer: >90 mL/min (ref >90)
Potassium: 3.5 mEq/L (ref 3.5–5.1)
Total Bilirubin: 1.4 mg/dL — ABNORMAL HIGH (ref 0.3–1.2)

## 2011-08-12 MED ORDER — ONDANSETRON HCL 4 MG/2ML IJ SOLN
4.0000 mg | INTRAMUSCULAR | Status: DC | PRN
  Administered 2011-08-12: 4 mg via INTRAVENOUS
  Filled 2011-08-12: qty 2

## 2011-08-12 MED ORDER — ONDANSETRON HCL 4 MG PO TABS: 4.0000 mg | ORAL_TABLET | ORAL | Status: DC | PRN

## 2011-08-12 MED ORDER — HYDROXYUREA 500 MG PO CAPS
1000.0000 mg | ORAL_CAPSULE | Freq: Every day | ORAL | Status: DC
  Administered 2011-08-12 – 2011-08-17 (×6): 1000 mg via ORAL
  Filled 2011-08-12 (×7): qty 2

## 2011-08-12 MED ORDER — HYDROMORPHONE HCL PF 2 MG/ML IJ SOLN
2.0000 mg | Freq: Once | INTRAMUSCULAR | Status: AC
  Administered 2011-08-12: 2 mg via INTRAVENOUS
  Filled 2011-08-12: qty 1

## 2011-08-12 MED ORDER — PANTOPRAZOLE SODIUM 40 MG PO TBEC
40.0000 mg | DELAYED_RELEASE_TABLET | Freq: Every day | ORAL | Status: DC
  Administered 2011-08-12 – 2011-08-14 (×3): 40 mg via ORAL
  Filled 2011-08-12 (×3): qty 1

## 2011-08-12 MED ORDER — PANTOPRAZOLE SODIUM 40 MG IV SOLR: 40.0000 mg | Freq: Every day | INTRAVENOUS | Status: DC

## 2011-08-12 MED ORDER — HYDROMORPHONE HCL PF 2 MG/ML IJ SOLN
2.0000 mg | INTRAMUSCULAR | Status: DC | PRN
  Administered 2011-08-12: 2 mg via INTRAVENOUS
  Administered 2011-08-12: 4 mg via INTRAVENOUS
  Administered 2011-08-12: 2 mg via INTRAVENOUS
  Administered 2011-08-12: 4 mg via INTRAVENOUS
  Filled 2011-08-12 (×2): qty 4

## 2011-08-12 MED ORDER — SODIUM CHLORIDE 0.9 % IV BOLUS (SEPSIS)
1000.0000 mL | Freq: Once | INTRAVENOUS | Status: AC
  Administered 2011-08-12: 1000 mL via INTRAVENOUS

## 2011-08-12 MED ORDER — HYDROMORPHONE HCL PF 2 MG/ML IJ SOLN: 2.0000 mg | Freq: Once | INTRAMUSCULAR | Status: DC

## 2011-08-12 MED ORDER — HYDROMORPHONE HCL PF 1 MG/ML IJ SOLN
2.0000 mg | INTRAMUSCULAR | Status: DC | PRN
  Administered 2011-08-12: 2 mg via INTRAVENOUS
  Filled 2011-08-12: qty 1
  Filled 2011-08-12: qty 2

## 2011-08-12 MED ORDER — FOLIC ACID 1 MG PO TABS
1.0000 mg | ORAL_TABLET | Freq: Every day | ORAL | Status: DC
  Administered 2011-08-12: 1 mg via ORAL
  Filled 2011-08-12: qty 1

## 2011-08-12 MED ORDER — DIPHENHYDRAMINE HCL 50 MG/ML IJ SOLN
12.5000 mg | Freq: Once | INTRAMUSCULAR | Status: AC
  Administered 2011-08-12: 12.5 mg via INTRAVENOUS
  Filled 2011-08-12: qty 1

## 2011-08-12 MED ORDER — ONDANSETRON HCL 4 MG/2ML IJ SOLN: 4.0000 mg | INTRAMUSCULAR | Status: DC | PRN

## 2011-08-12 MED ORDER — DIPHENHYDRAMINE HCL 50 MG/ML IJ SOLN
25.0000 mg | INTRAMUSCULAR | Status: DC | PRN
  Administered 2011-08-12 – 2011-08-16 (×3): 25 mg via INTRAVENOUS
  Filled 2011-08-12 (×3): qty 1

## 2011-08-12 MED ORDER — HYDROMORPHONE HCL PF 1 MG/ML IJ SOLN: 2.0000 mg | INTRAMUSCULAR | Status: DC | PRN

## 2011-08-12 MED ORDER — HYDROMORPHONE HCL PF 2 MG/ML IJ SOLN
INTRAMUSCULAR | Status: AC
  Administered 2011-08-12: 2 mg
  Filled 2011-08-12: qty 2

## 2011-08-12 MED ORDER — ONDANSETRON HCL 8 MG PO TABS: 4.0000 mg | ORAL_TABLET | ORAL | Status: DC | PRN

## 2011-08-12 MED ORDER — DEXTROSE-NACL 5-0.45 % IV SOLN
INTRAVENOUS | Status: DC
  Administered 2011-08-12 (×2): via INTRAVENOUS
  Administered 2011-08-12: 1000 mL via INTRAVENOUS
  Administered 2011-08-13: 125 mL/h via INTRAVENOUS
  Administered 2011-08-13 – 2011-08-14 (×2): via INTRAVENOUS
  Administered 2011-08-14: 125 mL/h via INTRAVENOUS
  Administered 2011-08-14: 22:00:00 via INTRAVENOUS
  Administered 2011-08-15: 1000 mL via INTRAVENOUS
  Administered 2011-08-15: 75 mL/h via INTRAVENOUS
  Administered 2011-08-16: 01:00:00 via INTRAVENOUS
  Administered 2011-08-16: 1000 mL via INTRAVENOUS

## 2011-08-12 MED ORDER — DEXTROSE-NACL 5-0.45 % IV SOLN: INTRAVENOUS | Status: DC

## 2011-08-12 MED ORDER — FOLIC ACID 1 MG PO TABS
1.0000 mg | ORAL_TABLET | Freq: Every day | ORAL | Status: DC
  Administered 2011-08-13 – 2011-08-17 (×5): 1 mg via ORAL
  Filled 2011-08-12 (×6): qty 1

## 2011-08-12 MED ORDER — ONDANSETRON HCL 4 MG/2ML IJ SOLN
4.0000 mg | Freq: Once | INTRAMUSCULAR | Status: AC
  Administered 2011-08-12: 4 mg via INTRAVENOUS
  Filled 2011-08-12: qty 2

## 2011-08-12 MED ORDER — HYDROXYUREA 500 MG PO CAPS: 500.0000 mg | ORAL_CAPSULE | Freq: Two times a day (BID) | ORAL | Status: DC

## 2011-08-12 MED ORDER — DIPHENHYDRAMINE HCL 25 MG PO CAPS: 25.0000 mg | ORAL_CAPSULE | ORAL | Status: DC | PRN

## 2011-08-12 NOTE — ED Notes (Signed)
PT. REPORTS GENERALIZED BODY ACHES WORSE AT LATERAL TORSO , STATES RECEIVED PHYSICAL THERAPY YESTERDAY FOR HER BACK PAIN .

## 2011-08-12 NOTE — ED Provider Notes (Signed)
History     CSN: [id number]  Arrival date & time 08/12/11  0155   First MD Initiated Contact with Patient 08/12/11 0327      Chief Complaint  Patient presents with  . Generalized Body Aches     HPI  History provided by the patient. Patient is a 21 year old female with history of sickle cell who presents with complaints of sickle cell pains. Patient states the pain began yesterday afternoon and has been persistent and worsening. Patient tried taking her breakthrough pain medications of oxycodone at home without any relief. Pain is primarily located in abdomen and flanks and somewhat in the upper extremities. Patient denies chest pain, shortness of breath or heart palpitations. Patient denies any other recent symptoms or complaints. She denies any fever, chills, sweats, cough, URI symptoms, nausea, vomiting, diarrhea, constipation, dysuria, hematuria or urinary frequency. Patient denies any other aggravating or alleviating factors. Symptoms are described as severe. Patient has history of similar presentations to the emergency room in the past.    Past Medical History  Diagnosis Date  . H/O: 1 miscarriage 03/22/2011  . 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 Active smoker 08/09/2010  . Depression 01/06/2011  . GERD (gastroesophageal reflux disease) 02/17/2011  . Trichotillomania     h/o  . Blood transfusion   . Sickle cell anemia with crisis     Past Surgical History  Procedure Date  . Cholecystectomy 05/2010  . Dilation and curettage of uterus 02/20/11    No family history on file.  History  Substance Use Topics  . Smoking status: Former Smoker    Quit date: 07/15/2011  . Smokeless tobacco: Never Used  . Alcohol Use: No    OB History    Grav Para Term Preterm Abortions TAB SAB Ect Mult Living   1    1           Review of Systems  Constitutional: Negative for fever and chills.  HENT: Negative for neck pain.   Respiratory: Negative for cough and shortness of breath.     Cardiovascular: Negative for chest pain.  Gastrointestinal: Positive for abdominal pain. Negative for nausea, vomiting, diarrhea and constipation.  Genitourinary: Positive for flank pain and vaginal bleeding. Negative for dysuria, frequency, hematuria and vaginal discharge.  Neurological: Negative for dizziness, weakness, light-headedness, numbness and headaches.    Allergies  Toradol  Home Medications   Current Outpatient Rx  Name Route Sig Dispense Refill  . FOLIC ACID 1 MG PO TABS Oral Take 1 mg by mouth daily.    Marland Kitchen HYDROXYUREA 500 MG PO CAPS Oral Take 1,000 mg by mouth every morning. May take with food to minimize GI side effects.    . OXYCODONE HCL 10 MG PO TABS Oral Take 10 mg by mouth 4 (four) times daily as needed. For pain.    Marland Kitchen PANTOPRAZOLE SODIUM 40 MG PO TBEC Oral Take 40 mg by mouth daily at 12 noon.    Marland Kitchen PRENATAL RX 60-1 MG PO TABS Oral Take 1 tablet by mouth every morning.      BP 105/64  Pulse 102  Temp(Src) 98.9 F (37.2 C) (Oral)  Resp 16  SpO2 100%  LMP 08/03/2011  Physical Exam  Nursing note and vitals reviewed. Constitutional: She is oriented to person, place, and time. She appears well-developed and well-nourished. No distress.  HENT:  Head: Normocephalic and atraumatic.  Neck: Normal range of motion. Neck supple.       No meningeal signs  Cardiovascular: Normal rate and  regular rhythm.   Pulmonary/Chest: Effort normal and breath sounds normal. No respiratory distress. She has no wheezes. She has no rales.  Abdominal: Soft. There is no hepatosplenomegaly. There is no rebound, no guarding, no tenderness at McBurney's point and negative Murphy's sign.       Laparoscopic surgical scars consistent with cholecystectomy.  Musculoskeletal: She exhibits no edema and no tenderness.  Neurological: She is alert and oriented to person, place, and time.  Skin: Skin is warm and dry. No rash noted.  Psychiatric: She has a normal mood and affect. Her behavior is  normal.    ED Course  Procedures   Results for orders placed during the hospital encounter of 08/12/11  CBC      Component Value Range   WBC 12.5 (*) 4.0 - 10.5 (K/uL)   RBC 2.82 (*) 3.87 - 5.11 (MIL/uL)   Hemoglobin 9.3 (*) 12.0 - 15.0 (g/dL)   HCT 16.1 (*) 09.6 - 46.0 (%)   MCV 93.3  78.0 - 100.0 (fL)   MCH 33.0  26.0 - 34.0 (pg)   MCHC 35.4  30.0 - 36.0 (g/dL)   RDW 04.5 (*) 40.9 - 15.5 (%)   Platelets 392  150 - 400 (K/uL)  DIFFERENTIAL      Component Value Range   Neutrophils Relative 60  43 - 77 (%)   Neutro Abs 7.6  1.7 - 7.7 (K/uL)   Lymphocytes Relative 26  12 - 46 (%)   Lymphs Abs 3.2  0.7 - 4.0 (K/uL)   Monocytes Relative 11  3 - 12 (%)   Monocytes Absolute 1.3 (*) 0.1 - 1.0 (K/uL)   Eosinophils Relative 3  0 - 5 (%)   Eosinophils Absolute 0.4  0.0 - 0.7 (K/uL)   Basophils Relative 0  0 - 1 (%)   Basophils Absolute 0.0  0.0 - 0.1 (K/uL)  COMPREHENSIVE METABOLIC PANEL      Component Value Range   Sodium 137  135 - 145 (mEq/L)   Potassium 3.5  3.5 - 5.1 (mEq/L)   Chloride 104  96 - 112 (mEq/L)   CO2 25  19 - 32 (mEq/L)   Glucose, Bld 110 (*) 70 - 99 (mg/dL)   BUN 10  6 - 23 (mg/dL)   Creatinine, Ser 8.11  0.50 - 1.10 (mg/dL)   Calcium 8.9  8.4 - 91.4 (mg/dL)   Total Protein 7.1  6.0 - 8.3 (g/dL)   Albumin 3.9  3.5 - 5.2 (g/dL)   AST 27  0 - 37 (U/L)   ALT 20  0 - 35 (U/L)   Alkaline Phosphatase 58  39 - 117 (U/L)   Total Bilirubin 1.4 (*) 0.3 - 1.2 (mg/dL)   GFR calc non Af Amer >90  >90 (mL/min)   GFR calc Af Amer >90  >90 (mL/min)  RETICULOCYTES      Component Value Range   Retic Ct Pct 9.7 (*) 0.4 - 3.1 (%)   RBC. 2.82 (*) 3.87 - 5.11 (MIL/uL)   Retic Count, Manual 273.5 (*) 19.0 - 186.0 (K/uL)        1. Sickle cell anemia with crisis       MDM  3:30 AM patient seen and evaluated. Patient in no acute distress.   Patient feeling better after first dose of pain medications. She still complains of some slight aches and pains in the [place]  area. She also reports slight nausea and requesting medicine for nausea. Will give additional dose. Have also discussed  with patient referral to the new sickle cell clinic. Plan to give her information to followup care if needed later today.    Angus Seller, [place] 08/12/11 [phone]

## 2011-08-12 NOTE — ED Notes (Signed)
Nursing called and was finally able to speak with accepting rn at the sickle cell clinic. Pt to be transported and driven over by her boyfriend at the bedside.

## 2011-08-12 NOTE — H&P (Signed)
[name] is an 21 y.o. female.   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 She had begin exercising more than usual. The subsequent day she developed increasing pains in her back and legs. Patient subsequently went emergency room for treatment of her sickle cell crises. She remained there overnight and was subsequently [facility] this morning. She continued to have significant pains which he rated as 8/10. Patient was treated with IV fluids as well as IV Dilaudid at 4 mg q.2 hours. She did make some progress but she was unable to manage her pain at home. She is subsequently admitted to the hospital at this time for further therapy.  Past Medical History  Diagnosis Date  . H/O: 1 miscarriage 03/22/2011  . 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 Active smoker 08/09/2010  . Depression 01/06/2011  . GERD (gastroesophageal reflux disease) 02/17/2011  . Trichotillomania     h/o  . Blood transfusion   . Sickle cell anemia with crisis     Past Surgical History  Procedure Date  . Cholecystectomy 05/2010  . Dilation and curettage of uterus 02/20/11    No family history on file. Social History:  reports that she quit smoking about 4 weeks ago. She has never used smokeless tobacco. She reports that she does not drink alcohol or use illicit drugs.  Allergies:  Allergies  Allergen Reactions  . Toradol Hives and Itching    Medications Prior to Admission  Medication Dose Route Frequency Provider Last Rate Last Dose  . dextrose 5 %-0.45 % sodium chloride infusion   Intravenous Continuous Gwenyth Bender, MD 125 mL/hr at 08/12/11 2100    . diphenhydrAMINE (BENADRYL) capsule 25-50 mg  25-50 mg Oral Q4H PRN Gwenyth Bender, MD      . diphenhydrAMINE (BENADRYL) injection 12.5 mg  12.5 mg Intravenous Once April K Palumbo-Rasch, MD   12.5 mg at 08/12/11 4098    . diphenhydrAMINE (BENADRYL) injection 25 mg  25 mg Intravenous Q4H PRN Gwenyth Bender, MD   25 mg at 08/12/11 1029  . folic acid (FOLVITE) tablet 1 mg  1 mg Oral Daily Gwenyth Bender, MD      . HYDROmorphone (DILAUDID) 2 MG/ML injection        2 mg at 08/12/11 2200  . HYDROmorphone (DILAUDID) injection 2 mg  2 mg Intravenous Once Angus Seller, PA   2 mg at 08/12/11 0515  . HYDROmorphone (DILAUDID) injection 2 mg  2 mg Intravenous Once Angus Seller, PA   2 mg at 08/12/11 1191  . HYDROmorphone (DILAUDID) injection 2 mg  2 mg Intravenous Once April K Palumbo-Rasch, MD   2 mg at 08/12/11 4782  . HYDROmorphone (DILAUDID) injection 2-4 mg  2-4 mg Intravenous Q2H PRN Gwenyth Bender, MD   4 mg at 08/12/11 1636  . hydroxyurea (HYDREA) capsule 1,000 mg  1,000 mg Oral Daily Gwenyth Bender, MD   1,000 mg at 08/12/11 1212  . ondansetron (ZOFRAN) injection 4 mg  4 mg Intravenous Once Angus Seller, PA   4 mg at 08/12/11 9562  . ondansetron (ZOFRAN) tablet 4 mg  4 mg Oral Q4H PRN Gwenyth Bender, MD       Or  . ondansetron [facility]) injection 4 mg  4 mg Intravenous Q4H PRN Gwenyth Bender, MD   4  mg at 08/12/11 2005  . pantoprazole (PROTONIX) EC tablet 40 mg  40 mg Oral Q1200 Gwenyth Bender, MD   40 mg at 08/12/11 1140  . sodium chloride 0.9 % bolus 1,000 mL  1,000 mL Intravenous Once Angus Seller, PA   1,000 mL at 08/12/11 0514  . DISCONTD: dextrose 5 %-0.45 % sodium chloride infusion   Intravenous Continuous Gwenyth Bender, MD      . DISCONTD: diphenhydrAMINE (BENADRYL) capsule 25-50 mg  25-50 mg Oral Q4H PRN Gwenyth Bender, MD      . DISCONTD: folic acid (FOLVITE) tablet 1 mg  1 mg Oral Daily Gwenyth Bender, MD   1 mg at 08/12/11 0959  . DISCONTD: HYDROmorphone (DILAUDID) injection 2 mg  2 mg Intramuscular Once Angus Seller, PA      . DISCONTD: HYDROmorphone (DILAUDID) injection 2 mg  2 mg Intravenous Q2H PRN Gwenyth Bender, MD   2 mg at 08/12/11 0920  . DISCONTD: HYDROmorphone (DILAUDID) injection 2 mg  2 mg Intravenous Q2H PRN  Gwenyth Bender, MD      . DISCONTD: hydroxyurea (HYDREA) capsule 500 mg  500 mg Oral BID Gwenyth Bender, MD      . DISCONTD: ondansetron [facility]) injection 4 mg  4 mg Intravenous Q4H PRN Gwenyth Bender, MD      . DISCONTD: ondansetron [facility]) tablet 4 mg  4 mg Oral Q4H PRN Gwenyth Bender, MD      . DISCONTD: pantoprazole (PROTONIX) injection 40 mg  40 mg Intravenous Q1200 Gwenyth Bender, MD       Medications Prior to Admission  Medication Sig Dispense Refill  . folic acid (FOLVITE) 1 MG tablet Take 1 mg by mouth daily.      . hydroxyurea (HYDREA) 500 MG capsule Take 1,000 mg by mouth every morning. May take with food to minimize GI side effects.      . pantoprazole (PROTONIX) 40 MG tablet Take 40 mg by mouth daily at 12 noon.      . Prenatal Vit-Fe Fumarate-FA (PRENATAL MULTIVITAMIN) 60-1 MG tablet Take 1 tablet by mouth every morning.      Marland Kitchen DISCONTD: medroxyPROGESTERone (DEPO-PROVERA) 150 MG/ML injection Inject 150 mg into the muscle every 3 (three) months. Last dose 04/20/11        Results for orders placed during the hospital encounter of 08/12/11 (from the past 48 hour(s))  CBC     Status: Abnormal   Collection Time   08/12/11  3:53 AM      Component Value Range Comment   WBC 12.5 (*) 4.0 - 10.5 (K/uL)    RBC 2.82 (*) 3.87 - 5.11 (MIL/uL)    Hemoglobin 9.3 (*) 12.0 - 15.0 (g/dL)    HCT 78.2 (*) 95.6 - 46.0 (%)    MCV 93.3  78.0 - 100.0 (fL)    MCH 33.0  26.0 - 34.0 (pg)    MCHC 35.4  30.0 - 36.0 (g/dL)    RDW 21.3 (*) 08.6 - 15.5 (%)    Platelets 392  150 - 400 (K/uL)   DIFFERENTIAL     Status: Abnormal   Collection Time   08/12/11  3:53 AM      Component Value Range Comment   Neutrophils Relative 60  43 - 77 (%)    Neutro Abs 7.6  1.7 - 7.7 (K/uL)    Lymphocytes Relative 26  12 - 46 (%)    Lymphs Abs 3.2  0.7 - 4.0 (K/uL)    Monocytes Relative 11  3 - 12 (%)    Monocytes Absolute 1.3 (*) 0.1 - 1.0 (K/uL)    Eosinophils Relative 3  0 - 5 (%)    Eosinophils Absolute 0.4  0.0 - 0.7 (K/uL)     Basophils Relative 0  0 - 1 (%)    Basophils Absolute 0.0  0.0 - 0.1 (K/uL)   COMPREHENSIVE METABOLIC PANEL     Status: Abnormal   Collection Time   08/12/11  3:53 AM      Component Value Range Comment   Sodium 137  135 - 145 (mEq/L)    Potassium 3.5  3.5 - 5.1 (mEq/L)    Chloride 104  96 - 112 (mEq/L)    CO2 25  19 - 32 (mEq/L)    Glucose, Bld 110 (*) 70 - 99 (mg/dL)    BUN 10  6 - 23 (mg/dL)    Creatinine, Ser 1.61  0.50 - 1.10 (mg/dL)    Calcium 8.9  8.4 - 10.5 (mg/dL)    Total Protein 7.1  6.0 - 8.3 (g/dL)    Albumin 3.9  3.5 - 5.2 (g/dL)    AST 27  0 - 37 (U/L) HEMOLYSIS AT THIS LEVEL MAY AFFECT RESULT   ALT 20  0 - 35 (U/L)    Alkaline Phosphatase 58  39 - 117 (U/L)    Total Bilirubin 1.4 (*) 0.3 - 1.2 (mg/dL)    GFR calc non Af Amer >90  >90 (mL/min)    GFR calc Af Amer >90  >90 (mL/min)   RETICULOCYTES     Status: Abnormal   Collection Time   08/12/11  3:53 AM      Component Value Range Comment   Retic Ct Pct 9.7 (*) 0.4 - 3.1 (%)    RBC. 2.82 (*) 3.87 - 5.11 (MIL/uL)    Retic Count, Manual 273.5 (*) 19.0 - 186.0 (K/uL)    No results found.  As previously noted. Previous history of tobacco abuse as well as substance abuse. She has been abstaining from this over the past month. Blood pressure 118/64, pulse 100, temperature 98.2 F (36.8 C), temperature source Oral, resp. rate 18, height 5\' 2"  (1.575 m), weight 176 lb 6.4 oz (80.015 kg), last menstrual period 08/04/2011, SpO2 98.00%. Well-developed well-nourished black female in moderate distress. Very tearful. HEENT: No sinus tenderness. Extra muscles intact. No sclera icterus. Scalp notable for patchy hair loss from  Chronic pulling of the hair. NECK: No enlarged thyroid. No posterior cervical nodes. LUNGS: Decreased breath sounds at bases. No wheezes. No vocal fremitus. CV: Normal S1, S2 without S3. No murmurs or rubs. ABDOMEN: Positive bowel sounds. No masses or tenderness. MSK: Tenderness in lower lumbar sacral  spine. Tenderness in the left ankle. Negative Homans bilaterally. NEUROLOGICAL: Alert, though usually prone to falling asleep on medication. Moves all extremities. Strength is intact.  Assessment/Plan Sickle cell crisis. Hemoglobin SS disease Major depression. GERD. Trichotillomania. History of tobacco abuse.  PLAN: Continue IV fluids. Assessment urology. Start PCA Dilaudid. Followup with caseworker evaluation. Continue PPI therapy, hydroxyurea, folate. Followup in a.m.  Madeleine Fenn 08/12/2011, 10:13 PM

## 2011-08-12 NOTE — ED Notes (Signed)
Minimal improvement in pain after Dilaudid. Additional pain med ordered. Ivf infusing site u

## 2011-08-12 NOTE — ED Notes (Signed)
IV team here and iv placed. Site u

## 2011-08-12 NOTE — H&P (Signed)
[name] is an 21 y.o. female.   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 Patient relates she had been doing well until 2 days ago when she had gone to the gym for the first time to start working out. She acknowledges she did a great deal of brisk walking as well as some light weight training with lunges. The subsequent night she developed severe limb pain as well as pain in her lower back. Her pain increased to level 8/10. She dranked fluids as well as took her pain medication without significant relief. She subsequently came to the emergency room at [facility]  hospital for further treatment. Patient was treated in emergency room without relief. She subsequently was transfered to [facility] for further therapy.  Past Medical History  Diagnosis Date  . H/O: 1 miscarriage 03/22/2011  . 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 Active smoker 08/09/2010  . Depression 01/06/2011  . GERD (gastroesophageal reflux disease) 02/17/2011  . Trichotillomania     h/o  . Blood transfusion   . Sickle cell anemia with crisis     Past Surgical History  Procedure Date  . Cholecystectomy 05/2010  . Dilation and curettage of uterus 02/20/11    No family history on file. Social History:  reports that she quit smoking about 4 weeks ago. She has never used smokeless tobacco. She reports that she does not drink alcohol or use illicit drugs.  Allergies:  Allergies  Allergen Reactions  . Toradol Hives and Itching    Medications Prior to Admission  Medication Dose Route Frequency Provider Last Rate Last Dose  . dextrose 5 %-0.45 % sodium chloride infusion   Intravenous Continuous Gwenyth Bender, MD 125 mL/hr at 08/12/11 9604    . diphenhydrAMINE (BENADRYL) capsule 25-50 mg  25-50 mg Oral Q4H PRN Gwenyth Bender, MD      . diphenhydrAMINE (BENADRYL) injection 12.5 mg  12.5 mg Intravenous Once April K Palumbo-Rasch, MD   12.5 mg at 08/12/11 5409  . diphenhydrAMINE (BENADRYL) injection 25 mg  25 mg Intravenous Q4H PRN Gwenyth Bender, MD   25 mg at 08/12/11 1029  . folic acid (FOLVITE) tablet 1 mg  1 mg Oral Daily Gwenyth Bender, MD      . HYDROmorphone (DILAUDID) injection 2 mg  2 mg Intravenous Once Angus Seller, PA   2 mg at 08/12/11 0515  . HYDROmorphone (DILAUDID) injection 2 mg  2 mg Intravenous Once Angus Seller, PA   2 mg at 08/12/11 8119  . HYDROmorphone (DILAUDID) injection 2 mg  2 mg Intravenous Once April K Palumbo-Rasch, MD   2 mg at 08/12/11 1478  . HYDROmorphone (DILAUDID) injection 2-4 mg  2-4 mg Intravenous Q2H PRN Gwenyth Bender, MD   4 mg at 08/12/11 1141  . hydroxyurea (HYDREA) capsule 1,000 mg  1,000 mg Oral Daily Gwenyth Bender, MD   1,000 mg at 08/12/11 1212  . ondansetron (ZOFRAN) injection 4 mg  4 mg Intravenous Once Angus Seller, PA   4 mg at 08/12/11 2956  . ondansetron (ZOFRAN) tablet 4 mg  4 mg Oral Q4H PRN Gwenyth Bender, MD       Or  . ondansetron [facility]) injection 4 mg  4 mg Intravenous Q4H PRN Lind Guest  August Saucer, MD      . pantoprazole (PROTONIX) EC tablet 40 mg  40 mg Oral Q1200 Gwenyth Bender, MD   40 mg at 08/12/11 1140  . sodium chloride 0.9 % bolus 1,000 mL  1,000 mL Intravenous Once Angus Seller, PA   1,000 mL at 08/12/11 0514  . DISCONTD: dextrose 5 %-0.45 % sodium chloride infusion   Intravenous Continuous Gwenyth Bender, MD      . DISCONTD: diphenhydrAMINE (BENADRYL) capsule 25-50 mg  25-50 mg Oral Q4H PRN Gwenyth Bender, MD      . DISCONTD: folic acid (FOLVITE) tablet 1 mg  1 mg Oral Daily Gwenyth Bender, MD   1 mg at 08/12/11 0959  . DISCONTD: HYDROmorphone (DILAUDID) injection 2 mg  2 mg Intramuscular Once Angus Seller, PA      . DISCONTD: HYDROmorphone (DILAUDID) injection 2 mg  2 mg Intravenous Q2H PRN Gwenyth Bender, MD   2 mg at 08/12/11 0920  . DISCONTD: HYDROmorphone (DILAUDID)  injection 2 mg  2 mg Intravenous Q2H PRN Gwenyth Bender, MD      . DISCONTD: hydroxyurea (HYDREA) capsule 500 mg  500 mg Oral BID Gwenyth Bender, MD      . DISCONTD: ondansetron [facility]) injection 4 mg  4 mg Intravenous Q4H PRN Gwenyth Bender, MD      . DISCONTD: ondansetron [facility]) tablet 4 mg  4 mg Oral Q4H PRN Gwenyth Bender, MD      . DISCONTD: pantoprazole (PROTONIX) injection 40 mg  40 mg Intravenous Q1200 Gwenyth Bender, MD       Medications Prior to Admission  Medication Sig Dispense Refill  . folic acid (FOLVITE) 1 MG tablet Take 1 mg by mouth daily.      . hydroxyurea (HYDREA) 500 MG capsule Take 1,000 mg by mouth every morning. May take with food to minimize GI side effects.      . pantoprazole (PROTONIX) 40 MG tablet Take 40 mg by mouth daily at 12 noon.      . Prenatal Vit-Fe Fumarate-FA (PRENATAL MULTIVITAMIN) 60-1 MG tablet Take 1 tablet by mouth every morning.      Marland Kitchen DISCONTD: medroxyPROGESTERone (DEPO-PROVERA) 150 MG/ML injection Inject 150 mg into the muscle every 3 (three) months. Last dose 04/20/11        Results for orders placed during the hospital encounter of 08/12/11 (from the past 48 hour(s))  CBC     Status: Abnormal   Collection Time   08/12/11  3:53 AM      Component Value Range Comment   WBC 12.5 (*) 4.0 - 10.5 (K/uL)    RBC 2.82 (*) 3.87 - 5.11 (MIL/uL)    Hemoglobin 9.3 (*) 12.0 - 15.0 (g/dL)    HCT 16.1 (*) 09.6 - 46.0 (%)    MCV 93.3  78.0 - 100.0 (fL)    MCH 33.0  26.0 - 34.0 (pg)    MCHC 35.4  30.0 - 36.0 (g/dL)    RDW 04.5 (*) 40.9 - 15.5 (%)    Platelets 392  150 - 400 (K/uL)   DIFFERENTIAL     Status: Abnormal   Collection Time   08/12/11  3:53 AM      Component Value Range Comment   Neutrophils Relative 60  43 - 77 (%)    Neutro Abs 7.6  1.7 - 7.7 (K/uL)    Lymphocytes Relative 26  12 - 46 (%)    Lymphs Abs  3.2  0.7 - 4.0 (K/uL)    Monocytes Relative 11  3 - 12 (%)    Monocytes Absolute 1.3 (*) 0.1 - 1.0 (K/uL)    Eosinophils Relative 3  0 - 5 (%)     Eosinophils Absolute 0.4  0.0 - 0.7 (K/uL)    Basophils Relative 0  0 - 1 (%)    Basophils Absolute 0.0  0.0 - 0.1 (K/uL)   COMPREHENSIVE METABOLIC PANEL     Status: Abnormal   Collection Time   08/12/11  3:53 AM      Component Value Range Comment   Sodium 137  135 - 145 (mEq/L)    Potassium 3.5  3.5 - 5.1 (mEq/L)    Chloride 104  96 - 112 (mEq/L)    CO2 25  19 - 32 (mEq/L)    Glucose, Bld 110 (*) 70 - 99 (mg/dL)    BUN 10  6 - 23 (mg/dL)    Creatinine, Ser 1.61  0.50 - 1.10 (mg/dL)    Calcium 8.9  8.4 - 10.5 (mg/dL)    Total Protein 7.1  6.0 - 8.3 (g/dL)    Albumin 3.9  3.5 - 5.2 (g/dL)    AST 27  0 - 37 (U/L) HEMOLYSIS AT THIS LEVEL MAY AFFECT RESULT   ALT 20  0 - 35 (U/L)    Alkaline Phosphatase 58  39 - 117 (U/L)    Total Bilirubin 1.4 (*) 0.3 - 1.2 (mg/dL)    GFR calc non Af Amer >90  >90 (mL/min)    GFR calc Af Amer >90  >90 (mL/min)   RETICULOCYTES     Status: Abnormal   Collection Time   08/12/11  3:53 AM      Component Value Range Comment   Retic Ct Pct 9.7 (*) 0.4 - 3.1 (%)    RBC. 2.82 (*) 3.87 - 5.11 (MIL/uL)    Retic Count, Manual 273.5 (*) 19.0 - 186.0 (K/uL)    No results found.  Review of systems otherwise unremarkable. She had recently reduced her intake of cigarettes as well as alcohol. Blood pressure 124/76, pulse 100, temperature 99.4 F (37.4 C), temperature source Oral, resp. rate 16, last menstrual period 08/04/2011, SpO2 95.00%. Well-developed slightly overweight black female in moderate distress. HEENT no sinus tenderness. She has patchy hair loss. Minimal sclera icterus. No posterior cervical nodes. Neck: No enlarged thyroid. No posterior cervical nodes. LUNGS: Clear to auscultation. No wheezes or rales. No vocal fremitus. CV: Normal S1, S2 without S3. Abdomen: No tenderness or masses appreciated. MSK: Tenderness in lower lumbar sacral spine. The range of motion upper extremities. Tenderness in the left ankle. Negative Homans. NEUROLOGICAL: Alert  oriented x3. Drowsy from medication. Moves all extremities. Nonfocal. PSYCHIATRIC: Ongoing anxiety with depression. Recent losses identified.  Assessment/Plan Acute sickle cell crisis. This was possibly induced by recent excessive physical activity. 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 Past history tobacco abuse and substance abuse. Presently abstaining. Trichotillomania.  PLAN: We'll treat patient and the sickle cell unit with IV fluids, IV analgesia. Diabetic as necessary. Reevaluate patient for continued therapy in hospital versus discharge home. Patient to followup in the sickle cell Center for workup in his care. She needs ongoing psychological counseling as well. Patient education on appropriate ways to begin exercise so as not do exacerbate her crisis.   [name] 08/12/2011, 3:12 PM

## 2011-08-12 NOTE — Discharge Instructions (Signed)
Sickle Cell Pain Crisis  Sickle cell anemia requires regular medical attention by your healthcare provider and awareness about when to seek medical care. Pain is a common problem in children with sickle cell disease. This usually starts at less than 20 year of age. Pain can occur nearly anywhere in the body but most commonly occurs in the extremities, back, chest, or belly (abdomen). Pain episodes can start suddenly or may follow an illness. These attacks can appear as decreased activity, loss of appetite, change in behavior, or simply complaints of pain. DIAGNOSIS   Specialized blood and gene testing can help make this diagnosis early in the disease. Blood tests may then be done to watch blood levels.   Specialized brain scans are done when there are problems in the brain during a crisis.   Lung testing may be done later in the disease.  HOME CARE INSTRUCTIONS   Maintain good hydration. Increase you or your child's fluid intake in hot weather and during exercise.   Avoid smoking. Smoking lowers the oxygen in the blood and can cause the production of sickle-shaped cells (sickling).   Control pain. Only take over-the-counter or prescription medicines for pain, discomfort, or fever as directed by your caregiver. Do not give aspirin to children because of the association with Reye's syndrome.   Keep regular health care checks to keep a proper red blood cell (hemoglobin) level. A moderate anemia level protects against sickling crises.   You and your child should receive all the same immunizations and care as the people around you.   Mothers should breastfeed their babies if possible. Use formulas with iron added if breastfeeding is not possible. Additional iron should not be given unless there is a lack of it. People with sickle cell disease (SCD) build up iron faster than normal. Give folic acid and additional vitamins as directed.   If you or your child has been prescribed antibiotics or other  medications to prevent problems, take them as directed.   Summer camps are available for children with SCD. They may help young people deal with their disease. The camps introduce them to other children with the same problem.   Young people with SCD may become frustrated or angry at their disease. This can cause rebellion and refusal to follow medical care. Help groups or counseling may help with these problems.   Wear a medical alert bracelet. When traveling, keep medical information, caregiver's names, and the medications you or your child takes with you at all times.  SEEK IMMEDIATE MEDICAL CARE IF:   You or your child develops dizziness or fainting, numbness in or difficulty with movement of arms and legs, difficulty with speech, or is acting abnormally. This could be early signs of a stroke. Immediate treatment is necessary.   You or your child has an oral temperature above 102 F (38.9 C), not controlled by medicine.   You or your child has other signs of infection (chills, lethargy, irritability, poor eating, vomiting). The younger the child, the more you should be concerned.   With fevers, do not give medicine to lower the fever right away. This could cover up a problem that is developing. Notify your caregiver.   You or your child develops pain that is not helped with medicine.   You or your child develops shortness of breath or is coughing up pus-like or bloody sputum.   You or your child develops any problems that are new and are causing you to worry.   You  or your child develops a persistent, often uncomfortable and painful penile erection. This is called priapism. Always check young boys for this. It is often embarrassing for them and they may not bring it to your attention. This is a medical emergency and needs immediate treatment. If this is not treated it will lead to impotence.   You or your child develops a new onset of abdominal pain, especially on the left side near the  stomach area.   You or your child has any questions or has problems that are not getting better. Return immediately if you feel your child is getting worse, even if your child was seen only a short while ago.   Document Released: 02/09/2005 Document Revised: 04/21/2011 Document Reviewed: 07/01/2009 Exit[employer] Patient Information 2012 [employer], [employer]CSickle Cell Pain Crisis Sickle cell anemia requires regular medical attention by your healthcare provider and awareness about when to seek medical care. Pain is a common problem in children with sickle cell disease. This usually starts at less than 20 year of age. Pain can occur nearly anywhere in the body but most commonly occurs in the extremities, back, chest, or belly (abdomen). Pain episodes can start suddenly or may follow an illness. These attacks can appear as decreased activity, loss of appetite, change in behavior, or simply complaints of pain. DIAGNOSIS   Specialized blood and gene testing can help make this diagnosis early in the disease. Blood tests may then be done to watch blood levels.   Specialized brain scans are done when there are problems in the brain during a crisis.   Lung testing may be done later in the disease.  HOME CARE INSTRUCTIONS   Maintain good hydration. Increase you or your child's fluid intake in hot weather and during exercise.   Avoid smoking. Smoking lowers the oxygen in the blood and can cause the production of sickle-shaped cells (sickling).   Control pain. Only take over-the-counter or prescription medicines for pain, discomfort, or fever as directed by your caregiver. Do not give aspirin to children because of the association with Reye's syndrome.   Keep regular health care checks to keep a proper red blood cell (hemoglobin) level. A moderate anemia level protects against sickling crises.   You and your child should receive all the same immunizations and care as the people around you.   Mothers should  breastfeed their babies if possible. Use formulas with iron added if breastfeeding is not possible. Additional iron should not be given unless there is a lack of it. People with sickle cell disease (SCD) build up iron faster than normal. Give folic acid and additional vitamins as directed.   If you or your child has been prescribed antibiotics or other medications to prevent problems, take them as directed.   Summer camps are available for children with SCD. They may help young people deal with their disease. The camps introduce them to other children with the same problem.   Young people with SCD may become frustrated or angry at their disease. This can cause rebellion and refusal to follow medical care. Help groups or counseling may help with these problems.   Wear a medical alert bracelet. When traveling, keep medical information, caregiver's names, and the medications you or your child takes with you at all times.  SEEK IMMEDIATE MEDICAL CARE IF:   You or your child develops dizziness or fainting, numbness in or difficulty with movement of arms and legs, difficulty with speech, or is acting abnormally. This could be early signs  of a stroke. Immediate treatment is necessary.   You or your child has an oral temperature above 102 F (38.9 C), not controlled by medicine.   You or your child has other signs of infection (chills, lethargy, irritability, poor eating, vomiting). The younger the child, the more you should be concerned.   With fevers, do not give medicine to lower the fever right away. This could cover up a problem that is developing. Notify your caregiver.   You or your child develops pain that is not helped with medicine.   You or your child develops shortness of breath or is coughing up pus-like or bloody sputum.   You or your child develops any problems that are new and are causing you to worry.   You or your child develops a persistent, often uncomfortable and painful penile  erection. This is called priapism. Always check young boys for this. It is often embarrassing for them and they may not bring it to your attention. This is a medical emergency and needs immediate treatment. If this is not treated it will lead to impotence.   You or your child develops a new onset of abdominal pain, especially on the left side near the stomach area.   You or your child has any questions or has problems that are not getting better. Return immediately if you feel your child is getting worse, even if your child was seen only a short while ago.  Document Released: 02/09/2005 Document Revised: 04/21/2011 Document Reviewed: 07/01/2009 [facility] Patient Information 2012 [place], [place].Marland Kitchen

## 2011-08-12 NOTE — Discharge Instructions (Signed)
You were seen and treated today for your sickle cell pains. At this time your providers feel you're able to return home. If you have continue pains please followup with the new [place] sick[facility] cell center located at the [facility]. Please also continue to followup with your primary care provider.   Sickle Cell Pain Crisis Sickle cell anemia requires regular medical attention by your healthcare provider and awareness about when to seek medical care. Pain is a common problem in children with sickle cell disease. This usually starts at less than 1 year of age. Pain can occur nearly anywhere in the body but most commonly occurs in the extremities, back, chest, or belly (abdomen). Pain episodes can start suddenly or may follow an illness. These attacks can appear as decreased activity, loss of appetite, change in behavior, or simply complaints of pain. DIAGNOSIS   Specialized blood and gene testing can help make this diagnosis early in the disease. Blood tests may then be done to watch blood levels.   Specialized brain scans are done when there are problems in the brain during a crisis.   Lung testing may be done later in the disease.  HOME CARE INSTRUCTIONS   Maintain good hydration. Increase you or your child's fluid intake in hot weather and during exercise.   Avoid smoking. Smoking lowers the oxygen in the blood and can cause the production of sickle-shaped cells (sickling).   Control pain. Only take over-the-counter or prescription medicines for pain, discomfort, or fever as directed by your caregiver. Do not give aspirin to children because of the association with Reye's syndrome.   Keep regular health care checks to keep a proper red blood cell (hemoglobin) level. A moderate anemia level protects against sickling crises.   You and your child should receive all the same immunizations and care as the people around you.   Mothers should breastfeed their babies if possible. Use  formulas with iron added if breastfeeding is not possible. Additional iron should not be given unless there is a lack of it. People with sickle cell disease (SCD) build up iron faster than normal. Give folic acid and additional vitamins as directed.   If you or your child has been prescribed antibiotics or other medications to prevent problems, take them as directed.   Summer camps are available for children with SCD. They may help young people deal with their disease. The camps introduce them to other children with the same problem.   Young people with SCD may become frustrated or angry at their disease. This can cause rebellion and refusal to follow medical care. Help groups or counseling may help with these problems.   Wear a medical alert bracelet. When traveling, keep medical information, caregiver's names, and the medications you or your child takes with you at all times.  SEEK IMMEDIATE MEDICAL CARE IF:   You or your child develops dizziness or fainting, numbness in or difficulty with movement of arms and legs, difficulty with speech, or is acting abnormally. This could be early signs of a stroke. Immediate treatment is necessary.   You or your child has an oral temperature above 102 F (38.9 C), not controlled by medicine.   You or your child has other signs of infection (chills, lethargy, irritability, poor eating, vomiting). The younger the child, the more you should be concerned.   With fevers, do not give medicine to lower the fever right away. This could cover up a problem that is developing. Notify your  caregiver.   You or your child develops pain that is not helped with medicine.   You or your child develops shortness of breath or is coughing up pus-like or bloody sputum.   You or your child develops any problems that are new and are causing you to worry.   You or your child develops a persistent, often uncomfortable and painful penile erection. This is called priapism.  Always check young boys for this. It is often embarrassing for them and they may not bring it to your attention. This is a medical emergency and needs immediate treatment. If this is not treated it will lead to impotence.   You or your child develops a new onset of abdominal pain, especially on the left side near the stomach area.   You or your child has any questions or has problems that are not getting better. Return immediately if you feel your child is getting worse, even if your child was seen only a short while ago.  Document Released: 02/09/2005 Document Revised: 04/21/2011 Document Reviewed: 07/01/2009 [facility] Patient Information 2012 [place], [place].   RESOURCE GUIDE  Dental Problems  Patients with Medicaid: [facility] [phone]                                           [phone] [address] Phone:  [phone]                                                  Phone:  [phone]  If unable to pay or uninsured, contact:  [facility] or [facility]. to become qualified for the adult dental clinic.  Chronic Pain Problems Contact [facility]  [phone] Patients need to be referred by their primary care doctor.  Insufficient Money for Medicine Contact [employer]:  call "211" or [facility] [phone].  No Primary Care Doctor Call [facility]  [phone] Other agencies that provide inexpensive medical care    [facility]  [phone]    [facility]  [phone]    [facility]  [phone]    [facility]  [phone]    [facility]  [phone]    [facility]  [phone]  Psychological Services [facility]  [phone] [facility]  [phone] [facility]   [phone] (emergency services [phone])  Substance Abuse Resources [facility]  [phone] [facility] [phone] [facility] [place] [phone] Floydene Flock [phone] Residential & Outpatient Substance Abuse Program  [phone]  Abuse/Neglect [facility] Child Abuse Hotline 519-415-6674 [facility] Child Abuse Hotline (947)288-4815 (After Hours)  Emergency Shelter [facility] 901-065-9641  Maternity Homes Room at the [place] [facility] [place] [phone] [place] Alert Services [phone]  MRSA Hotline #:   385-349-7159    [facility]  [facility] Dept. [address]      [place] Phone:  [phone]                                   Phone:  [phone]                 Phone:  [place] Phone:  [place] [phone] (After Hours)

## 2011-08-12 NOTE — ED Notes (Signed)
Sickle cell pt. Small veins. Attempted IV x1 w/o success. Pt re-assessed for additional attempt. Minimal access. IV team called. Returned call and IV nurse given name/MRN location and reason for IV.

## 2011-08-12 NOTE — ED Provider Notes (Signed)
Medical screening examination/treatment/procedure(s) were performed by non-physician practitioner and as supervising physician I was immediately available for consultation/collaboration.  Jasmine Awe, MD 08/12/11 [phone]

## 2011-08-13 ENCOUNTER — Encounter (HOSPITAL_COMMUNITY): Payer: Self-pay | Admitting: *Deleted

## 2011-08-13 ENCOUNTER — Inpatient Hospital Stay (HOSPITAL_COMMUNITY): Payer: Medicaid Other

## 2011-08-13 MED ORDER — CHLORHEXIDINE GLUCONATE 0.12 % MT SOLN
15.0000 mL | Freq: Two times a day (BID) | OROMUCOSAL | Status: DC
  Administered 2011-08-13 – 2011-08-16 (×2): 15 mL via OROMUCOSAL
  Filled 2011-08-13 (×12): qty 15

## 2011-08-13 MED ORDER — ENOXAPARIN SODIUM 40 MG/0.4ML ~~LOC~~ SOLN
40.0000 mg | SUBCUTANEOUS | Status: DC
  Administered 2011-08-13 – 2011-08-15 (×3): 40 mg via SUBCUTANEOUS
  Filled 2011-08-13 (×6): qty 0.4

## 2011-08-13 MED ORDER — PRENATAL MULTIVITAMIN CH
1.0000 | ORAL_TABLET | Freq: Every day | ORAL | Status: DC
  Administered 2011-08-13 – 2011-08-17 (×5): 1 via ORAL
  Filled 2011-08-13 (×6): qty 1

## 2011-08-13 MED ORDER — AZITHROMYCIN 250 MG PO TABS
250.0000 mg | ORAL_TABLET | Freq: Every day | ORAL | Status: AC
  Administered 2011-08-14 – 2011-08-17 (×4): 250 mg via ORAL
  Filled 2011-08-13 (×4): qty 1

## 2011-08-13 MED ORDER — ONDANSETRON HCL 4 MG/2ML IJ SOLN
4.0000 mg | Freq: Four times a day (QID) | INTRAMUSCULAR | Status: DC | PRN
  Administered 2011-08-13 – 2011-08-14 (×5): 4 mg via INTRAVENOUS
  Filled 2011-08-13 (×5): qty 2

## 2011-08-13 MED ORDER — HYDROMORPHONE 0.3 MG/ML IV SOLN
INTRAVENOUS | Status: AC
  Filled 2011-08-13: qty 25

## 2011-08-13 MED ORDER — AZITHROMYCIN 500 MG PO TABS
500.0000 mg | ORAL_TABLET | Freq: Once | ORAL | Status: AC
  Administered 2011-08-13: 500 mg via ORAL
  Filled 2011-08-13: qty 1

## 2011-08-13 MED ORDER — BIOTENE DRY MOUTH MT LIQD: 15.0000 mL | Freq: Two times a day (BID) | OROMUCOSAL | Status: DC

## 2011-08-13 MED ORDER — NALOXONE HCL 0.4 MG/ML IJ SOLN: 0.4000 mg | INTRAMUSCULAR | Status: DC | PRN

## 2011-08-13 MED ORDER — HYDROMORPHONE 0.3 MG/ML IV SOLN
INTRAVENOUS | Status: AC
  Administered 2011-08-13: 25 mg
  Filled 2011-08-13: qty 25

## 2011-08-13 MED ORDER — HYDROMORPHONE 0.3 MG/ML IV SOLN
INTRAVENOUS | Status: DC
  Administered 2011-08-13: 0.6 mg via INTRAVENOUS
  Administered 2011-08-13: 23:00:00 via INTRAVENOUS
  Administered 2011-08-13: 25 mg via INTRAVENOUS
  Administered 2011-08-13: 1.5 mg via INTRAVENOUS
  Administered 2011-08-13: 23:00:00 via INTRAVENOUS
  Administered 2011-08-13: 1.5 mg via INTRAVENOUS
  Administered 2011-08-14: 2.1 mg via INTRAVENOUS
  Administered 2011-08-14: 1.2 mg via INTRAVENOUS
  Administered 2011-08-14: 0.3 mg via INTRAVENOUS
  Administered 2011-08-14: 2.4 mg via INTRAVENOUS
  Administered 2011-08-14: 21:00:00 via INTRAVENOUS
  Administered 2011-08-15: 3 mg via INTRAVENOUS
  Administered 2011-08-15: 0.3 mg via INTRAVENOUS
  Administered 2011-08-15: 1.2 mg via INTRAVENOUS
  Administered 2011-08-15: 0.3 mg via INTRAVENOUS
  Administered 2011-08-15: 1.8 mg via INTRAVENOUS
  Administered 2011-08-15: 0.3 mg via INTRAVENOUS
  Administered 2011-08-15: 1.5 mg via INTRAVENOUS
  Administered 2011-08-16: 1.2 mg via INTRAVENOUS
  Administered 2011-08-16: 25 mL via INTRAVENOUS
  Administered 2011-08-16: 3.55 mg via INTRAVENOUS
  Administered 2011-08-16 (×2): 1.5 mg via INTRAVENOUS
  Administered 2011-08-16: 3.9 mg via INTRAVENOUS
  Administered 2011-08-17: 2.08 mg via INTRAVENOUS
  Administered 2011-08-17: 02:00:00 via INTRAVENOUS

## 2011-08-13 MED ORDER — SODIUM CHLORIDE 0.9 % IJ SOLN: 9.0000 mL | INTRAMUSCULAR | Status: DC | PRN

## 2011-08-13 MED ORDER — PANTOPRAZOLE SODIUM 40 MG PO TBEC: 40.0000 mg | DELAYED_RELEASE_TABLET | Freq: Every day | ORAL | Status: DC

## 2011-08-13 NOTE — Progress Notes (Signed)
Subjective:  Patient is slowly feeling better. She rates her pain as a 7/10. Her pain is in her back and limbs. She also has other nonurgent concerns. He describes problems with focusing and ADHD. She talked about her chronic anxiety as well. Patient has not been willing to discuss her medical issues in the past. She's been encouraged to followup with the Medical Center after resolving crisis to address some her other nonemergent concerns.   Allergies  Allergen Reactions  . Toradol Hives and Itching   Current Facility-Administered Medications  Medication Dose Route Frequency Provider Last Rate Last Dose  . antiseptic oral rinse (BIOTENE) solution 15 mL  15 mL Mouth Rinse q12n4p Gwenyth Bender, MD      . azithromycin [facility]) tablet 250 mg  250 mg Oral Daily Gwenyth Bender, MD      . azithromycin [facility]) tablet 500 mg  500 mg Oral Once Gwenyth Bender, MD   500 mg at 08/13/11 1539  . chlorhexidine (PERIDEX) 0.12 % solution 15 mL  15 mL Mouth Rinse BID Gwenyth Bender, MD      . dextrose 5 %-0.45 % sodium chloride infusion   Intravenous Continuous Gwenyth Bender, MD 125 mL/hr at 08/13/11 1407 125 mL/hr at 08/13/11 1407  . diphenhydrAMINE (BENADRYL) capsule 25-50 mg  25-50 mg Oral Q4H PRN Gwenyth Bender, MD      . diphenhydrAMINE (BENADRYL) injection 25 mg  25 mg Intravenous Q4H PRN Gwenyth Bender, MD   25 mg at 08/12/11 1029  . enoxaparin (LOVENOX) injection 40 mg  40 mg Subcutaneous Q24H Annia Belt, PHARMD   40 mg at 08/13/11 1539  . folic acid (FOLVITE) tablet 1 mg  1 mg Oral Daily Gwenyth Bender, MD   1 mg at 08/13/11 1010  . HYDROmorphone (DILAUDID) 2 MG/ML injection        2 mg at 08/12/11 2200  . HYDROmorphone (DILAUDID) PCA injection 0.3 mg/mL   Intravenous Q4H Gwenyth Bender, MD   1.5 mg at 08/13/11 1540  . HYDROmorphone PCA 0.3 mg/mL (DILAUDID) 0.3 mg/mL infusion        25 mg at 08/13/11 0315  . hydroxyurea (HYDREA) capsule 1,000 mg  1,000 mg Oral Daily Gwenyth Bender, MD   1,000 mg at 08/13/11 1010    . naloxone [facility]) injection 0.4 mg  0.4 mg Intravenous PRN Gwenyth Bender, MD       And  . sodium chloride 0.9 % injection 9 mL  9 mL Intravenous PRN Gwenyth Bender, MD      . ondansetron [facility]) injection 4 mg  4 mg Intravenous Q6H PRN Gwenyth Bender, MD   4 mg at 08/13/11 1320  . ondansetron (ZOFRAN) tablet 4 mg  4 mg Oral Q4H PRN Gwenyth Bender, MD      . pantoprazole (PROTONIX) EC tablet 40 mg  40 mg Oral Q1200 Gwenyth Bender, MD   40 mg at 08/13/11 1217  . prenatal multivitamin tablet 1 tablet  1 tablet Oral Daily Gwenyth Bender, MD   1 tablet at 08/13/11 1010  . DISCONTD: HYDROmorphone (DILAUDID) injection 2-4 mg  2-4 mg Intravenous Q2H PRN Gwenyth Bender, MD   2 mg at 08/12/11 2300  . DISCONTD: ondansetron (ZOFRAN) injection 4 mg  4 mg Intravenous Q4H PRN Gwenyth Bender, MD   4 mg at 08/12/11 2005  . DISCONTD: pantoprazole (PROTONIX) EC tablet 40 mg  40 mg Oral Q1200  Gwenyth Bender, MD        Objective: Blood pressure 110/67, pulse 105, temperature 98.7 F (37.1 C), temperature source Oral, resp. rate 18, height 5\' 2"  (1.575 m), weight 176 lb 6.4 oz (80.015 kg), last menstrual period 08/04/2011, SpO2 98.00%.  Well-developed well-nourished black female presently in no acute distress. HEENT: No sinus tenderness. NECK: No posterior cervical nodes. LUNGS: Coarse breath sounds at bases. No wheezes. Patchy vocal fremitus. CV: Normal S1, S2 without S3. ABD: No epigastric tenderness. MSK: Tenderness lower LS-spine. Negative Homans. NEURO: Nonfocal.  Lab results: Results for orders placed during the hospital encounter of 08/12/11 (from the past 48 hour(s))  CBC     Status: Abnormal   Collection Time   08/12/11  3:53 AM      Component Value Range Comment   WBC 12.5 (*) 4.0 - 10.5 (K/uL)    RBC 2.82 (*) 3.87 - 5.11 (MIL/uL)    Hemoglobin 9.3 (*) 12.0 - 15.0 (g/dL)    HCT 16.1 (*) 09.6 - 46.0 (%)    MCV 93.3  78.0 - 100.0 (fL)    MCH 33.0  26.0 - 34.0 (pg)    MCHC 35.4  30.0 - 36.0 (g/dL)    RDW 04.5 (*)  40.9 - 15.5 (%)    Platelets 392  150 - 400 (K/uL)   DIFFERENTIAL     Status: Abnormal   Collection Time   08/12/11  3:53 AM      Component Value Range Comment   Neutrophils Relative 60  43 - 77 (%)    Neutro Abs 7.6  1.7 - 7.7 (K/uL)    Lymphocytes Relative 26  12 - 46 (%)    Lymphs Abs 3.2  0.7 - 4.0 (K/uL)    Monocytes Relative 11  3 - 12 (%)    Monocytes Absolute 1.3 (*) 0.1 - 1.0 (K/uL)    Eosinophils Relative 3  0 - 5 (%)    Eosinophils Absolute 0.4  0.0 - 0.7 (K/uL)    Basophils Relative 0  0 - 1 (%)    Basophils Absolute 0.0  0.0 - 0.1 (K/uL)   COMPREHENSIVE METABOLIC PANEL     Status: Abnormal   Collection Time   08/12/11  3:53 AM      Component Value Range Comment   Sodium 137  135 - 145 (mEq/L)    Potassium 3.5  3.5 - 5.1 (mEq/L)    Chloride 104  96 - 112 (mEq/L)    CO2 25  19 - 32 (mEq/L)    Glucose, Bld 110 (*) 70 - 99 (mg/dL)    BUN 10  6 - 23 (mg/dL)    Creatinine, Ser 8.11  0.50 - 1.10 (mg/dL)    Calcium 8.9  8.4 - 10.5 (mg/dL)    Total Protein 7.1  6.0 - 8.3 (g/dL)    Albumin 3.9  3.5 - 5.2 (g/dL)    AST 27  0 - 37 (U/L) HEMOLYSIS AT THIS LEVEL MAY AFFECT RESULT   ALT 20  0 - 35 (U/L)    Alkaline Phosphatase 58  39 - 117 (U/L)    Total Bilirubin 1.4 (*) 0.3 - 1.2 (mg/dL)    GFR calc non Af Amer >90  >90 (mL/min)    GFR calc Af Amer >90  >90 (mL/min)   RETICULOCYTES     Status: Abnormal   Collection Time   08/12/11  3:53 AM      Component Value Range Comment   Retic  Ct Pct 9.7 (*) 0.4 - 3.1 (%)    RBC. 2.82 (*) 3.87 - 5.11 (MIL/uL)    Retic Count, Manual 273.5 (*) 19.0 - 186.0 (K/uL)     Studies/Results: No results found.  Patient Active Problem List  Diagnoses  . SICKLE CELL ANEMIA  . TRICHOTILLOMANIA  . Active smoker  . Back pain  . Depression  . GERD (gastroesophageal reflux disease)  . Contraception management  . Stress  . Overweight    Impression: Sickle cell crisis improving. Low back pain secondary to 1, rule out other. Anxiety  disorder with depression. History of gastroesophageal reflux disease. History suggestive ADHD untreated. Substance abuse history i.e. marijuana.   Plan: Continue her present therapy. X-ray/CT scan of lower back. Set up for counseling and further therapy as outpatient.   August Saucer, [place] Borden 08/13/2011 7:45 PM

## 2011-08-13 NOTE — Progress Notes (Signed)
ANTICOAGULATION CONSULT NOTE - Initial Consult  Pharmacy Consult for Lovenox Indication: DVT Prophylaxis  Allergies  Allergen Reactions  . Toradol Hives and Itching    Patient Measurements: Height: 5\' 2"  (157.5 cm) Weight: 176 lb 6.4 oz (80.015 kg) IBW/kg (Calculated) : 50.1   Vital Signs: Temp: 98.5 F (36.9 C) (03/30 1346) Temp src: Oral (03/30 1346) BP: 115/74 mmHg (03/30 1346) Pulse Rate: 100  (03/30 1346)  Labs:  Basename 08/12/11 0353  HGB 9.3*  HCT 26.3*  PLT 392  APTT --  LABPROT --  INR --  HEPARINUNFRC --  CREATININE 0.59  CKTOTAL --  CKMB --  TROPONINI --   Estimated Creatinine Clearance: 110.9 ml/min (by C-G formula based on Cr of 0.59).  Medical History: Past Medical History  Diagnosis Date  . H/O: 1 miscarriage 03/22/2011  . 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 Active smoker 08/09/2010  . Depression 01/06/2011  . GERD (gastroesophageal reflux disease) 02/17/2011  . Trichotillomania     h/o  . Blood transfusion   . Sickle cell anemia with crisis     Medications:  Scheduled:    . antiseptic oral rinse  15 mL Mouth Rinse q12n4p  . azithromycin  250 mg Oral Daily  . azithromycin  500 mg Oral Once  . chlorhexidine  15 mL Mouth Rinse BID  . enoxaparin (LOVENOX) injection  40 mg Subcutaneous Q24H  . folic acid  1 mg Oral Daily  . HYDROmorphone      . HYDROmorphone PCA 0.3 mg/mL   Intravenous Q4H  . HYDROmorphone PCA 0.3 mg/mL      . hydroxyurea  1,000 mg Oral Daily  . pantoprazole  40 mg Oral Q1200  . prenatal multivitamin  1 tablet Oral Daily  . DISCONTD: pantoprazole  40 mg Oral Q1200   Infusions:    . dextrose 5 % and 0.45% NaCl 125 mL/hr (08/13/11 1407)   PRN: diphenhydrAMINE, diphenhydrAMINE, naloxone, ondansetron (ZOFRAN) IV, ondansetron, sodium chloride, DISCONTD: HYDROmorphone, DISCONTD: ondansetron (ZOFRAN) IV  Assessment: 21 yo F admitted 3/29 with SS crisis. Order to start Lovenox per Rx for VTE Prophylaxis. No mention of acute VTE  in chart notes, no recent dopplers or CT scans indicating any acute VTE events, so will dose Lovenox at standard VTE prophylaxis dosing to meet Core Measures.  Goal of Therapy:  Prevention of VTE event   Plan:  1) Lovenox 40mg  SQ q24h  Darrol Angel, PharmD Pager: [phone] 08/13/2011,2:35 PM

## 2011-08-14 LAB — COMPREHENSIVE METABOLIC PANEL
ALT: 15 U/L (ref 0–35)
AST: 20 U/L (ref 0–37)
Alkaline Phosphatase: 55 U/L (ref 39–117)
CO2: 26 mEq/L (ref 19–32)
Calcium: 8.8 mg/dL (ref 8.4–10.5)
Chloride: 104 mEq/L (ref 96–112)
GFR calc non Af Amer: >90 mL/min (ref >90)
Potassium: 3.6 mEq/L (ref 3.5–5.1)
Sodium: 137 mEq/L (ref 135–145)
Total Bilirubin: 1 mg/dL (ref 0.3–1.2)

## 2011-08-14 LAB — CBC
Hemoglobin: 8.9 g/dL — ABNORMAL LOW (ref 12.0–15.0)
Platelets: 476 10*3/uL — ABNORMAL HIGH (ref 150–400)
RBC: 2.69 MIL/uL — ABNORMAL LOW (ref 3.87–5.11)
WBC: 8.9 10*3/uL (ref 4.0–10.5)

## 2011-08-14 MED ORDER — LIDOCAINE 5 % EX PTCH
1.0000 | MEDICATED_PATCH | CUTANEOUS | Status: DC
  Administered 2011-08-14 – 2011-08-17 (×4): 1 via TRANSDERMAL
  Filled 2011-08-14 (×5): qty 1

## 2011-08-14 MED ORDER — PANTOPRAZOLE SODIUM 40 MG PO TBEC
40.0000 mg | DELAYED_RELEASE_TABLET | Freq: Two times a day (BID) | ORAL | Status: DC
  Administered 2011-08-14 – 2011-08-16 (×4): 40 mg via ORAL
  Filled 2011-08-14 (×9): qty 1

## 2011-08-14 MED ORDER — VITAMIN D3 25 MCG (1000 UNIT) PO TABS
1000.0000 [IU] | ORAL_TABLET | Freq: Every day | ORAL | Status: DC
  Administered 2011-08-14 – 2011-08-17 (×4): 1000 [IU] via ORAL
  Filled 2011-08-14 (×5): qty 1

## 2011-08-14 MED ORDER — HYDROMORPHONE 0.3 MG/ML IV SOLN
INTRAVENOUS | Status: AC
  Administered 2011-08-15: 1.8 mg via INTRAVENOUS
  Filled 2011-08-14: qty 25

## 2011-08-14 NOTE — Progress Notes (Signed)
Subjective:  Patient continues to complain of low back pain. She rated pain as a 7/10. She denies chest pains or shortness of breath. She ambulated in the room. No lightheadedness or dizziness. Results of patient's x-ray of her lower back was reviewed as well.today she complained of more epigastric pain. Is not worsening nausea vomiting. There's been no hematemesis or melena. While at   Allergies  Allergen Reactions  . Toradol Hives and Itching   Current Facility-Administered Medications  Medication Dose Route Frequency Provider Last Rate Last Dose  . antiseptic oral rinse (BIOTENE) solution 15 mL  15 mL Mouth Rinse q12n4p Gwenyth Bender, MD      . azithromycin [facility]) tablet 250 mg  250 mg Oral Daily Gwenyth Bender, MD   250 mg at 08/14/11 1002  . chlorhexidine (PERIDEX) 0.12 % solution 15 mL  15 mL Mouth Rinse BID Gwenyth Bender, MD   15 mL at 08/13/11 2005  . cholecalciferol (VITAMIN D) tablet 1,000 Units  1,000 Units Oral Daily Gwenyth Bender, MD   1,000 Units at 08/14/11 1343  . dextrose 5 %-0.45 % sodium chloride infusion   Intravenous Continuous Gwenyth Bender, MD 125 mL/hr at 08/14/11 2158    . diphenhydrAMINE (BENADRYL) capsule 25-50 mg  25-50 mg Oral Q4H PRN Gwenyth Bender, MD      . diphenhydrAMINE (BENADRYL) injection 25 mg  25 mg Intravenous Q4H PRN Gwenyth Bender, MD   25 mg at 08/12/11 1029  . enoxaparin (LOVENOX) injection 40 mg  40 mg Subcutaneous Q24H Annia Belt, PHARMD   40 mg at 08/14/11 1454  . folic acid (FOLVITE) tablet 1 mg  1 mg Oral Daily Gwenyth Bender, MD   1 mg at 08/14/11 1002  . HYDROmorphone (DILAUDID) PCA injection 0.3 mg/mL   Intravenous Q4H Gwenyth Bender, MD      . HYDROmorphone PCA 0.3 mg/mL (DILAUDID) 0.3 mg/mL infusion           . hydroxyurea (HYDREA) capsule 1,000 mg  1,000 mg Oral Daily Gwenyth Bender, MD   1,000 mg at 08/14/11 1002  . lidocaine (LIDODERM) 5 % 1 patch  1 patch Transdermal Q24H Gwenyth Bender, MD   1 patch at 08/14/11 1344  . naloxone [facility]) injection  0.4 mg  0.4 mg Intravenous PRN Gwenyth Bender, MD       And  . sodium chloride 0.9 % injection 9 mL  9 mL Intravenous PRN Gwenyth Bender, MD      . ondansetron [facility]) injection 4 mg  4 mg Intravenous Q6H PRN Gwenyth Bender, MD   4 mg at 08/14/11 1002  . ondansetron (ZOFRAN) tablet 4 mg  4 mg Oral Q4H PRN Gwenyth Bender, MD      . pantoprazole (PROTONIX) EC tablet 40 mg  40 mg Oral BID AC Gwenyth Bender, MD   40 mg at 08/14/11 1756  . prenatal multivitamin tablet 1 tablet  1 tablet Oral Daily Gwenyth Bender, MD   1 tablet at 08/14/11 1002  . DISCONTD: pantoprazole (PROTONIX) EC tablet 40 mg  40 mg Oral Q1200 Gwenyth Bender, MD   40 mg at 08/14/11 1155    Objective: Blood pressure 96/60, pulse 98, temperature 98.2 F (36.8 C), temperature source Oral, resp. rate 20, height 5\' 2"  (1.575 m), weight 180 lb 8 oz (81.874 kg), last menstrual period 08/04/2011, SpO2 95.00%.  Well-developed overweight black female in no acute distress.  Tearful at times. HEENT:no sinus tenderness. NECK:no posterior cervical nodes. LUNGS:a clear to auscultation. No wheezes. ZO:XWRUEA S1, S2 without S3. VWU:JWJX epigastric tenderness. BJY:NWGNFAOZHY in lower lumbar sacral spine. NEURO:intact.  Lab results: Results for orders placed during the hospital encounter of 08/12/11 (from the past 48 hour(s))  CBC     Status: Abnormal   Collection Time   08/14/11  1:55 PM      Component Value Range Comment   WBC 8.9  4.0 - 10.5 (K/uL)    RBC 2.69 (*) 3.87 - 5.11 (MIL/uL)    Hemoglobin 8.9 (*) 12.0 - 15.0 (g/dL)    HCT 86.5 (*) 78.4 - 46.0 (%)    MCV 91.8  78.0 - 100.0 (fL)    MCH 33.1  26.0 - 34.0 (pg)    MCHC 36.0  30.0 - 36.0 (g/dL)    RDW 69.6 (*) 29.5 - 15.5 (%)    Platelets 476 (*) 150 - 400 (K/uL)   COMPREHENSIVE METABOLIC PANEL     Status: Abnormal   Collection Time   08/14/11  1:55 PM      Component Value Range Comment   Sodium 137  135 - 145 (mEq/L)    Potassium 3.6  3.5 - 5.1 (mEq/L)    Chloride 104  96 - 112 (mEq/L)     CO2 26  19 - 32 (mEq/L)    Glucose, Bld 94  70 - 99 (mg/dL)    BUN 5 (*) 6 - 23 (mg/dL)    Creatinine, Ser 2.84  0.50 - 1.10 (mg/dL)    Calcium 8.8  8.4 - 10.5 (mg/dL)    Total Protein 6.6  6.0 - 8.3 (g/dL)    Albumin 3.6  3.5 - 5.2 (g/dL)    AST 20  0 - 37 (U/L)    ALT 15  0 - 35 (U/L)    Alkaline Phosphatase 55  39 - 117 (U/L)    Total Bilirubin 1.0  0.3 - 1.2 (mg/dL)    GFR calc non Af Amer >90  >90 (mL/min)    GFR calc Af Amer >90  >90 (mL/min)     Studies/Results: Dg Lumbar Spine 2-3 Views  08/13/2011  *RADIOLOGY REPORT*  Clinical Data: Low back pain and difficulty walking.  Sickle cell crisis.  LUMBAR SPINE - 2-3 VIEW  Comparison: CT abdomen and pelvis 06/21/2011  Findings: Five lumbar type vertebra.  Mild coarsening of vertebral trabecular architecture and central endplate compression deformities consistent with history of sickle cell disease.  Normal alignment of the lumbar vertebrae.  No significant changes since previous study, allowing for differences in the technique.  IMPRESSION: Bone changes consistent with sickle cell.  No displaced fractures identified.  Original Report Authenticated By: Marlon Pel, M.D.    Patient Active Problem List  Diagnoses  . SICKLE CELL ANEMIA  . TRICHOTILLOMANIA  . Active smoker  . Back pain  . Depression  . GERD (gastroesophageal reflux disease)  . Contraception management  . Stress  . Overweight    Impression: Sickle cell crisis improving. 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 History of GERD with recent increased symptoms. Tobacco abuse abstaining. Depression. Needs further counseling.   Plan: Continue her present therapy. Lidocaine patches on her back. PT evaluation. Increase her Protonix to b.i.d.   August Saucer, Ladarius Seubert 08/14/2011 11:38 PM

## 2011-08-15 DIAGNOSIS — F909 Attention-deficit hyperactivity disorder, unspecified type: Secondary | ICD-10-CM

## 2011-08-15 DIAGNOSIS — F429 Obsessive-compulsive disorder, unspecified: Secondary | ICD-10-CM

## 2011-08-15 LAB — COMPREHENSIVE METABOLIC PANEL
ALT: 13 U/L (ref 0–35)
Calcium: 8.7 mg/dL (ref 8.4–10.5)
GFR calc Af Amer: >90 mL/min (ref >90)
Glucose, Bld: 105 mg/dL — ABNORMAL HIGH (ref 70–99)
Sodium: 138 mEq/L (ref 135–145)
Total Protein: 6.5 g/dL (ref 6.0–8.3)

## 2011-08-15 LAB — CBC
Hemoglobin: 9 g/dL — ABNORMAL LOW (ref 12.0–15.0)
MCH: 33.1 pg (ref 26.0–34.0)
MCHC: 35.9 g/dL (ref 30.0–36.0)
RDW: 16.3 % — ABNORMAL HIGH (ref 11.5–15.5)

## 2011-08-15 MED ORDER — PROMETHAZINE HCL 25 MG PO TABS: 12.5000 mg | ORAL_TABLET | ORAL | Status: DC | PRN

## 2011-08-15 MED ORDER — POLYETHYLENE GLYCOL 3350 17 G PO PACK
17.0000 g | PACK | Freq: Every day | ORAL | Status: DC
  Administered 2011-08-15 – 2011-08-16 (×2): 17 g via ORAL
  Filled 2011-08-15 (×3): qty 1

## 2011-08-15 MED ORDER — HYDROMORPHONE 0.3 MG/ML IV SOLN
INTRAVENOUS | Status: AC
  Filled 2011-08-15: qty 25

## 2011-08-15 MED ORDER — SUMATRIPTAN SUCCINATE 50 MG PO TABS
50.0000 mg | ORAL_TABLET | ORAL | Status: DC | PRN
  Filled 2011-08-15: qty 1

## 2011-08-15 MED ORDER — PANTOPRAZOLE SODIUM 40 MG PO TBEC: 40.0000 mg | DELAYED_RELEASE_TABLET | Freq: Every day | ORAL | Status: DC

## 2011-08-15 MED ORDER — SUMATRIPTAN SUCCINATE 50 MG PO TABS
50.0000 mg | ORAL_TABLET | Freq: Once | ORAL | Status: AC
  Administered 2011-08-15: 50 mg via ORAL
  Filled 2011-08-15: qty 1

## 2011-08-15 MED ORDER — PROMETHAZINE HCL 25 MG/ML IJ SOLN
12.5000 mg | Freq: Four times a day (QID) | INTRAMUSCULAR | Status: DC
  Administered 2011-08-15 – 2011-08-16 (×2): 12.5 mg via INTRAVENOUS
  Administered 2011-08-17: 25 mg via INTRAVENOUS
  Administered 2011-08-17: 12.5 mg via INTRAVENOUS
  Filled 2011-08-15 (×15): qty 1

## 2011-08-15 MED ORDER — IBUPROFEN 800 MG PO TABS
800.0000 mg | ORAL_TABLET | Freq: Three times a day (TID) | ORAL | Status: DC
  Administered 2011-08-15 – 2011-08-16 (×2): 800 mg via ORAL
  Filled 2011-08-15 (×5): qty 1

## 2011-08-15 MED ORDER — SENNOSIDES-DOCUSATE SODIUM 8.6-50 MG PO TABS
1.0000 | ORAL_TABLET | Freq: Every day | ORAL | Status: DC
  Administered 2011-08-16: 1 via ORAL
  Filled 2011-08-15 (×4): qty 1

## 2011-08-15 NOTE — Progress Notes (Signed)
PT Cancellation Note  ___Treatment cancelled today due to medical issues with patient which prohibited   therapy  ___ Treatment cancelled today due to patient receiving procedure or test   _x__ Treatment cancelled today due to patient's refusal to participate x 3 , pt reports back pain .   ___ Treatment cancelled today due to

## 2011-08-15 NOTE — Progress Notes (Signed)
Subjective: The patient was seen on rounds today.  The patient was sitting quietly in her bed, eating a meal tray and visiting with her boyfriend.  The patient is complaining of pain 8/10 in her bilateral flank areas and diffuse back pain.  The patient is also complaining of constipation and having a migraine HA. The patient states that Zofran is not working for her nausea.  The patient states that the PCA is providing adequate pain control and her pruritis is being managed effectively.  The patient's boyfriend interrupted my assessment several times and I eventually asked him to please refrain.  In reviewing the patient's chart and her history of anxiety, depression, ADHD and  Trichotillomania, I asked the patient if she would be willing to discuss these issues with a Psychiatrist and she was willing to do so.  Behavioral Health consult requested.  No other nursing or patient concerns at this time.   Objective: Vital signs in last 24 hours: Blood pressure 120/62, pulse 98, temperature 99.1 F (37.3 C), temperature source Oral, resp. rate 20, height 5\' 2"  (1.575 m), weight 180 lb 14.4 oz (82.056 kg), last menstrual period 08/04/2011, SpO2 100.00%.  General Appearance: Alert, cooperative, well nourished, well developed, mild distress Head: Normocephalic, without obvious abnormality, atraumatic Eyes: PERRLA, EOM's intact, scleral icterus Nose: Nares, septum and mucosa are normal, no drainage or sinus tenderness Throat: Lips, mucosa, and tongue normal,  teeth and gums normal Back: Symmetric, no curvature, decreased ROM normal, bilateral CVA tenderness Cardio: Regular rate and rhythm, S1, S2 normal, no murmur, click, rub or gallop GI: Soft, distended, non-tender, normoactive bowel sounds, no organomegaly Extremities: Extremities normal, atraumatic, no cyanosis, no edema, Homans sign is negative, no sign of DVT Pulses: 2+ and symmetric Skin: Skin color, texture, turgor normal,  no rashes or  lesions Neurologic: Grossly normal, AO x 3, no focal deficits, CN II - XII intact Psych:  Appropriate affect  Lab Results: Results for orders placed during the hospital encounter of 08/12/11 (from the past 24 hour(s))  CBC     Status: Abnormal   Collection Time   08/14/11  1:55 PM      Component Value Range   WBC 8.9  4.0 - 10.5 (K/uL)   RBC 2.69 (*) 3.87 - 5.11 (MIL/uL)   Hemoglobin 8.9 (*) 12.0 - 15.0 (g/dL)   HCT 69.6 (*) 29.5 - 46.0 (%)   MCV 91.8  78.0 - 100.0 (fL)   MCH 33.1  26.0 - 34.0 (pg)   MCHC 36.0  30.0 - 36.0 (g/dL)   RDW 28.4 (*) 13.2 - 15.5 (%)   Platelets 476 (*) 150 - 400 (K/uL)  COMPREHENSIVE METABOLIC PANEL     Status: Abnormal   Collection Time   08/14/11  1:55 PM      Component Value Range   Sodium 137  135 - 145 (mEq/L)   Potassium 3.6  3.5 - 5.1 (mEq/L)   Chloride 104  96 - 112 (mEq/L)   CO2 26  19 - 32 (mEq/L)   Glucose, Bld 94  70 - 99 (mg/dL)   BUN 5 (*) 6 - 23 (mg/dL)   Creatinine, Ser 4.40  0.50 - 1.10 (mg/dL)   Calcium 8.8  8.4 - 10.2 (mg/dL)   Total Protein 6.6  6.0 - 8.3 (g/dL)   Albumin 3.6  3.5 - 5.2 (g/dL)   AST 20  0 - 37 (U/L)   ALT 15  0 - 35 (U/L)   Alkaline Phosphatase 55  39 -  117 (U/L)   Total Bilirubin 1.0  0.3 - 1.2 (mg/dL)   GFR calc non Af Amer >90  >90 (mL/min)   GFR calc Af Amer >90  >90 (mL/min)  CBC     Status: Abnormal   Collection Time   08/15/11  4:10 AM      Component Value Range   WBC 9.7  4.0 - 10.5 (K/uL)   RBC 2.72 (*) 3.87 - 5.11 (MIL/uL)   Hemoglobin 9.0 (*) 12.0 - 15.0 (g/dL)   HCT 16.1 (*) 09.6 - 46.0 (%)   MCV 92.3  78.0 - 100.0 (fL)   MCH 33.1  26.0 - 34.0 (pg)   MCHC 35.9  30.0 - 36.0 (g/dL)   RDW 04.5 (*) 40.9 - 15.5 (%)   Platelets 442 (*) 150 - 400 (K/uL)  COMPREHENSIVE METABOLIC PANEL     Status: Abnormal   Collection Time   08/15/11  4:10 AM      Component Value Range   Sodium 138  135 - 145 (mEq/L)   Potassium 3.5  3.5 - 5.1 (mEq/L)   Chloride 105  96 - 112 (mEq/L)   CO2 25  19 - 32 (mEq/L)    Glucose, Bld 105 (*) 70 - 99 (mg/dL)   BUN 5 (*) 6 - 23 (mg/dL)   Creatinine, Ser 8.11  0.50 - 1.10 (mg/dL)   Calcium 8.7  8.4 - 91.4 (mg/dL)   Total Protein 6.5  6.0 - 8.3 (g/dL)   Albumin 3.6  3.5 - 5.2 (g/dL)   AST 19  0 - 37 (U/L)   ALT 13  0 - 35 (U/L)   Alkaline Phosphatase 55  39 - 117 (U/L)   Total Bilirubin 0.9  0.3 - 1.2 (mg/dL)   GFR calc non Af Amer >90  >90 (mL/min)   GFR calc Af Amer >90  >90 (mL/min)    Studies/Results: Dg Lumbar Spine 2-3 Views  08/13/2011  *RADIOLOGY REPORT*  Clinical Data: Low back pain and difficulty walking.  Sickle cell crisis.  LUMBAR SPINE - 2-3 VIEW  Comparison: CT abdomen and pelvis 06/21/2011  Findings: Five lumbar type vertebra.  Mild coarsening of vertebral trabecular architecture and central endplate compression deformities consistent with history of sickle cell disease.  Normal alignment of the lumbar vertebrae.  No significant changes since previous study, allowing for differences in the technique.  IMPRESSION: Bone changes consistent with sickle cell.  No displaced fractures identified.  Original Report Authenticated By: Marlon Pel, M.D.     Medications:  Allergies  Allergen Reactions  . Toradol Hives and Itching     Current Facility-Administered Medications  Medication Dose Route Frequency Provider Last Rate Last Dose  . antiseptic oral rinse (BIOTENE) solution 15 mL  15 mL Mouth Rinse q12n4p Gwenyth Bender, MD      . azithromycin [facility]) tablet 250 mg  250 mg Oral Daily Gwenyth Bender, MD   250 mg at 08/15/11 1000  . chlorhexidine (PERIDEX) 0.12 % solution 15 mL  15 mL Mouth Rinse BID Gwenyth Bender, MD   15 mL at 08/13/11 2005  . cholecalciferol (VITAMIN D) tablet 1,000 Units  1,000 Units Oral Daily Gwenyth Bender, MD   1,000 Units at 08/15/11 1000  . dextrose 5 %-0.45 % sodium chloride infusion   Intravenous Continuous Keitha Butte, NP 125 mL/hr at 08/15/11 0522 1,000 mL at 08/15/11 0522  . diphenhydrAMINE (BENADRYL)  capsule 25-50 mg  25-50 mg Oral Q4H PRN Lind Guest  August Saucer, MD      . diphenhydrAMINE (BENADRYL) injection 25 mg  25 mg Intravenous Q4H PRN Gwenyth Bender, MD   25 mg at 08/14/11 2345  . enoxaparin (LOVENOX) injection 40 mg  40 mg Subcutaneous Q24H Annia Belt, PHARMD   40 mg at 08/14/11 1454  . folic acid (FOLVITE) tablet 1 mg  1 mg Oral Daily Gwenyth Bender, MD   1 mg at 08/15/11 1000  . HYDROmorphone (DILAUDID) PCA injection 0.3 mg/mL   Intravenous Q4H Gwenyth Bender, MD   0.3 mg at 08/15/11 0800  . hydroxyurea (HYDREA) capsule 1,000 mg  1,000 mg Oral Daily Gwenyth Bender, MD   1,000 mg at 08/15/11 1000  . ibuprofen (ADVIL,MOTRIN) tablet 800 mg  800 mg Oral TID Keitha Butte, NP      . lidocaine (LIDODERM) 5 % 1 patch  1 patch Transdermal Q24H Gwenyth Bender, MD   1 patch at 08/14/11 1344  . naloxone [facility]) injection 0.4 mg  0.4 mg Intravenous PRN Gwenyth Bender, MD       And  . sodium chloride 0.9 % injection 9 mL  9 mL Intravenous PRN Gwenyth Bender, MD      . pantoprazole (PROTONIX) EC tablet 40 mg  40 mg Oral BID AC Gwenyth Bender, MD   40 mg at 08/15/11 1000  . polyethylene glycol (MIRALAX / GLYCOLAX) packet 17 g  17 g Oral Daily Keitha Butte, NP      . prenatal multivitamin tablet 1 tablet  1 tablet Oral Daily Gwenyth Bender, MD   1 tablet at 08/15/11 1000  . promethazine (PHENERGAN) injection 12.5-25 mg  12.5-25 mg Intravenous Q6H Keitha Butte, NP      . senna-docusate (Senokot-S) tablet 1 tablet  1 tablet Oral QHS Keitha Butte, NP      . SUMAtriptan (IMITREX) tablet 50 mg  50 mg Oral Once Keitha Butte, NP      . SUMAtriptan (IMITREX) tablet 50 mg  50 mg Oral Q2H PRN Keitha Butte, NP      . DISCONTD: ondansetron (ZOFRAN) injection 4 mg  4 mg Intravenous Q6H PRN Gwenyth Bender, MD   4 mg at 08/14/11 2346  . DISCONTD: ondansetron (ZOFRAN) tablet 4 mg  4 mg Oral Q4H PRN Gwenyth Bender, MD        Assessment/Plan: Patient Active Problem List  Diagnoses  . SICKLE CELL ANEMIA   . TRICHOTILLOMANIA  . Active smoker  . Back pain  . Depression  . GERD (gastroesophageal reflux disease)  . Contraception management  . Stress  . Overweight    Sickle Cell Crisis:  The patient is not in active hemolysis.  Will continue pain/nausea/pruritis/bowel management, IV hydration, Folic Acid, Hydroxyurea and GI/DVT prophylaxis.  The patient will be working with PT for her chronic back pain - Lumbar spine xray is negative.  Ibuprofen and Phenergan have been scheduled for the next 24 hours.  CMP, Mg, Phos, Ferritin, ProBNP, Hemoglobinopathy, CBC in the am.  Vitamin D level is pending. Trichotillomania/Anxiety/Depression/possible ADHD:  Behavioral health has been consulted for medications recommendations.  Discussed and agreed upon plan of care with the patient.   The plan of care will be adjusted based on the patient's clinical progress.   Larina Bras, NP-C 08/15/2011, 11:02 AM

## 2011-08-15 NOTE — Consult Note (Signed)
Reason for Consult:anxiety depression pulling out hair ADHD Referring Physician: Dr. Mayer [name] is an 21 y.o. female.  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 Patient relates she had been doing well until 2 days ago when she had gone to the gym for the first time to start working out. She acknowledges she did a great deal of brisk walking as well as some light weight training with lunges. The subsequent night she developed severe limb pain as well as pain in her lower back. Her pain increased to level 8/10. She dranked fluids as well as took her pain medication without significant relief. She subsequently came to the emergency room at [facility] hospital for further treatment. Patient was treated in emergency room without relief. She subsequently was transfered to [facility] for further therapy. Today she says the pain is in her low back.   AXIS I Tricotillomania, Depression, Anxiety, ADHD [self-diagnosed] AXIS II Deferred AXIS III Past Medical History  Diagnosis Date  . H/O: 1 miscarriage 03/22/2011  . 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 Active smoker 08/09/2010  . Depression 01/06/2011  . GERD (gastroesophageal reflux disease) 02/17/2011  . Trichotillomania     h/o  . Blood transfusion   . Sickle cell anemia with crisis     Past Surgical History  Procedure Date  . Cholecystectomy 05/2010  . Dilation and curettage of uterus 02/20/11  AXIS IV [employer] Parent[employer] in childhood, ambitious   AXIS V 46  History reviewed. No pertinent family history.  Social History:  reports that she quit smoking about 4 weeks ago. She has never used smokeless tobacco. She reports that she does not drink alcohol or use illicit drugs.  Allergies:  Allergies  Allergen Reactions  . Toradol Hives and Itching     Medications: I have reviewed the patient's current medications.  Results for orders placed during the hospital encounter of 08/12/11 (from the past 48 hour(s))  CBC     Status: Abnormal   Collection Time   08/14/11  1:55 PM      Component Value Range Comment   WBC 8.9  4.0 - 10.5 (K/uL)    RBC 2.69 (*) 3.87 - 5.11 (MIL/uL)    Hemoglobin 8.9 (*) 12.0 - 15.0 (g/dL)    HCT 16.1 (*) 09.6 - 46.0 (%)    MCV 91.8  78.0 - 100.0 (fL)    MCH 33.1  26.0 - 34.0 (pg)    MCHC 36.0  30.0 - 36.0 (g/dL)    RDW 04.5 (*) 40.9 - 15.5 (%)    Platelets 476 (*) 150 - 400 (K/uL)   COMPREHENSIVE METABOLIC PANEL     Status: Abnormal   Collection Time   08/14/11  1:55 PM      Component Value Range Comment   Sodium 137  135 - 145 (mEq/L)    Potassium 3.6  3.5 - 5.1 (mEq/L)    Chloride 104  96 - 112 (mEq/L)    CO2 26  19 - 32 (mEq/L)    Glucose, Bld 94  70 - 99 (mg/dL)    BUN 5 (*) 6 - 23 (mg/dL)    Creatinine, Ser 8.11  0.50 - 1.10 (mg/dL)    Calcium 8.8  8.4 - 10.5 (mg/dL)    Total Protein 6.6  6.0 - 8.3 (g/dL)  Albumin 3.6  3.5 - 5.2 (g/dL)    AST 20  0 - 37 (U/L)    ALT 15  0 - 35 (U/L)    Alkaline Phosphatase 55  39 - 117 (U/L)    Total Bilirubin 1.0  0.3 - 1.2 (mg/dL)    GFR calc non Af Amer >90  >90 (mL/min)    GFR calc Af Amer >90  >90 (mL/min)   CBC     Status: Abnormal   Collection Time   08/15/11  4:10 AM      Component Value Range Comment   WBC 9.7  4.0 - 10.5 (K/uL)    RBC 2.72 (*) 3.87 - 5.11 (MIL/uL)    Hemoglobin 9.0 (*) 12.0 - 15.0 (g/dL)    HCT 16.1 (*) 09.6 - 46.0 (%)    MCV 92.3  78.0 - 100.0 (fL)    MCH 33.1  26.0 - 34.0 (pg)    MCHC 35.9  30.0 - 36.0 (g/dL)    RDW 04.5 (*) 40.9 - 15.5 (%)    Platelets 442 (*) 150 - 400 (K/uL)   COMPREHENSIVE METABOLIC PANEL     Status: Abnormal   Collection Time   08/15/11  4:10 AM      Component Value Range Comment   Sodium 138  135 - 145 (mEq/L)    Potassium 3.5  3.5 - 5.1 (mEq/L)    Chloride 105  96 - 112 (mEq/L)    CO2 25  19 -  32 (mEq/L)    Glucose, Bld 105 (*) 70 - 99 (mg/dL)    BUN 5 (*) 6 - 23 (mg/dL)    Creatinine, Ser 8.11  0.50 - 1.10 (mg/dL)    Calcium 8.7  8.4 - 10.5 (mg/dL)    Total Protein 6.5  6.0 - 8.3 (g/dL)    Albumin 3.6  3.5 - 5.2 (g/dL)    AST 19  0 - 37 (U/L)    ALT 13  0 - 35 (U/L)    Alkaline Phosphatase 55  39 - 117 (U/L)    Total Bilirubin 0.9  0.3 - 1.2 (mg/dL)    GFR calc non Af Amer >90  >90 (mL/min)    GFR calc Af Amer >90  >90 (mL/min)     Dg Lumbar Spine 2-3 Views  08/13/2011  *RADIOLOGY REPORT*  Clinical Data: Low back pain and difficulty walking.  Sickle cell crisis.  LUMBAR SPINE - 2-3 VIEW  Comparison: CT abdomen and pelvis 06/21/2011  Findings: Five lumbar type vertebra.  Mild coarsening of vertebral trabecular architecture and central endplate compression deformities consistent with history of sickle cell disease.  Normal alignment of the lumbar vertebrae.  No significant changes since previous study, allowing for differences in the technique.  IMPRESSION: Bone changes consistent with sickle cell.  No displaced fractures identified.  Original Report Authenticated By: Marlon Pel, M.D.    Review of Systems  Unable to perform ROS: other   Blood pressure 97/60, pulse 92, temperature 97.5 F (36.4 C), temperature source Oral, resp. rate 16, height 5\' 2"  (1.575 m), weight 82.056 kg (180 lb 14.4 oz), last menstrual period 08/04/2011, SpO2 98.00%. 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  She has had this condition since age 72 and was in therapy for it.  She has also been depressed with very passive wishes  to 'not be here'. She denies any active SI thought or attemtpt; denies HI/AH/VH. She is very comfortable with goal directed plans, making a life better for herself and completing college.  She has shown, by history, lots of resilience.  She  was the eldest of siblings.  Father was 'a crack addict' and mom was working all the time, too busy to be 'a mom'. Pt did all the cooking and became the parentified child looking after her siblings.  She says she did not have to study much in high school.  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  She offers no history of problems of ADHD or ADD from teachers or from mother.  Significant is her BF was treated as a child for ADHD.  Too often, college students 'inherit' this diagnosis by 'auditory osmosis'.  This is not to discredit her claim of difficult focusing; it may even be her pain medication she has taken.  She deserves a full evaluation for ADHD  by a psychaitrist she may see on an outpatient basis, especially to continue behavioral treatment for trichotillomania.  She is willing today to try a medication for her hair pulling.  RECOMMENDATION 1. Advise no Rx for ADHD, please refer to outpatient psychiatrist for full evaluation. 2. Consider  Rx Luvox, fluvoxamine, for OCD-tricotillomania 50 mg starting dose. Titration is possible as tolerated to 200 mg, IF COMPATIBLE with other medications taken. IFF this patient is NOT pregnant; h/o miscarriage but no mention of pregnancy.  3. Luvox is an SSRI and may have beneficial effects for depression/anxiety as well  4. Please refer to outpatient psychiatrist and therapy when ready for discharge 5. No further psychiatric needs identified.  MD Psychiatrist signs off.   [name] 08/15/2011, 9:18 PM

## 2011-08-15 NOTE — H&P (Signed)
Signed late 

## 2011-08-16 LAB — PRO B NATRIURETIC PEPTIDE: Pro B Natriuretic peptide (BNP): 18 pg/mL (ref 0–125)

## 2011-08-16 LAB — COMPREHENSIVE METABOLIC PANEL
Alkaline Phosphatase: 48 U/L (ref 39–117)
BUN: 7 mg/dL (ref 6–23)
Chloride: 103 mEq/L (ref 96–112)
GFR calc Af Amer: >90 mL/min (ref >90)
GFR calc non Af Amer: >90 mL/min (ref >90)
Glucose, Bld: 99 mg/dL (ref 70–99)
Potassium: 3.7 mEq/L (ref 3.5–5.1)
Total Bilirubin: 1.1 mg/dL (ref 0.3–1.2)

## 2011-08-16 LAB — CBC
HCT: 27.3 % — ABNORMAL LOW (ref 36.0–46.0)
Hemoglobin: 9.6 g/dL — ABNORMAL LOW (ref 12.0–15.0)
MCHC: 35.2 g/dL (ref 30.0–36.0)
WBC: 10 10*3/uL (ref 4.0–10.5)

## 2011-08-16 LAB — FERRITIN: Ferritin: 466 ng/mL — ABNORMAL HIGH (ref 10–291)

## 2011-08-16 LAB — PHOSPHORUS: Phosphorus: 4.4 mg/dL (ref 2.3–4.6)

## 2011-08-16 MED ORDER — POLYETHYLENE GLYCOL 3350 17 G PO PACK
17.0000 g | PACK | Freq: Every day | ORAL | Status: DC
  Administered 2011-08-16 – 2011-08-17 (×2): 17 g via ORAL
  Filled 2011-08-16 (×3): qty 1

## 2011-08-16 MED ORDER — FLUVOXAMINE MALEATE 50 MG PO TABS
50.0000 mg | ORAL_TABLET | Freq: Every day | ORAL | Status: DC
  Administered 2011-08-16: 50 mg via ORAL
  Filled 2011-08-16 (×3): qty 1

## 2011-08-16 MED ORDER — HYDROMORPHONE 0.3 MG/ML IV SOLN
INTRAVENOUS | Status: AC
  Filled 2011-08-16: qty 25

## 2011-08-16 MED ORDER — IBUPROFEN 800 MG PO TABS
800.0000 mg | ORAL_TABLET | Freq: Three times a day (TID) | ORAL | Status: AC
  Administered 2011-08-16 – 2011-08-17 (×2): 800 mg via ORAL
  Filled 2011-08-16 (×2): qty 1

## 2011-08-16 NOTE — Progress Notes (Signed)
Subjective: The patient was seen on rounds today.  The patient was sitting quietly in her bed, eating a meal tray and visiting with her boyfriend.  The patient is complaining of pain 6/10 and she is experiencing diffuse back pain.  The patient is also complaining of constipation.  The patient and the patient's nurse stated that she refused the Miralax today.  The patient refused PT yesterday.  The patient's nurse stated that she refused Biotene today.  The patient states that the PCA is providing adequate pain control.  The patient's HA has resolved.   Dr. Ferol Luz, Psychiatrist assessed the patient yesterday and suggested that the patient start Luvox. The patient states her baseline pain is a 5 at home and that she will be ready to manage her pain at home tomorrow.  No other nursing or patient concerns at this time.   Objective: Vital signs in last 24 hours: Blood pressure 95/42, pulse 71, temperature 98.1 F (36.7 C), temperature source Oral, resp. rate 13, height 5\' 2"  (1.575 m), weight 184 lb 4.9 oz (83.6 kg), last menstrual period 08/04/2011, SpO2 100.00%.  General Appearance: Alert, cooperative, well nourished, well developed, no apparent distress Head: Normocephalic, without obvious abnormality, atraumatic Eyes: PERRLA, EOM's intact, scleral icterus Nose: Nares, septum and mucosa are normal, no drainage or sinus tenderness Throat: Lips, mucosa, and tongue normal,  teeth and gums normal Back: Symmetric, no curvature, decreased ROM normal, slight bilateral CVA tenderness Cardio: Regular rate and rhythm, S1, S2 normal, no murmur, click, rub or gallop GI: Soft, distended, non-tender, hypoactive bowel sounds, no organomegaly Extremities: Extremities normal, atraumatic, no cyanosis, no edema, Homans sign is negative, no sign of DVT Pulses: 2+ and symmetric Skin: Skin color, texture, turgor normal,  no rashes or lesions Neurologic: Grossly normal, AO x 3, no focal deficits, CN II - XII  intact Psych:  Appropriate affect  Lab Results: Results for orders placed during the hospital encounter of 08/12/11 (from the past 24 hour(s))  CBC     Status: Abnormal   Collection Time   08/16/11  6:44 AM      Component Value Range   WBC 10.0  4.0 - 10.5 (K/uL)   RBC 2.92 (*) 3.87 - 5.11 (MIL/uL)   Hemoglobin 9.6 (*) 12.0 - 15.0 (g/dL)   HCT 41.3 (*) 24.4 - 46.0 (%)   MCV 93.5  78.0 - 100.0 (fL)   MCH 32.9  26.0 - 34.0 (pg)   MCHC 35.2  30.0 - 36.0 (g/dL)   RDW 01.0 (*) 27.2 - 15.5 (%)   Platelets 438 (*) 150 - 400 (K/uL)  COMPREHENSIVE METABOLIC PANEL     Status: Normal   Collection Time   08/16/11  6:44 AM      Component Value Range   Sodium 137  135 - 145 (mEq/L)   Potassium 3.7  3.5 - 5.1 (mEq/L)   Chloride 103  96 - 112 (mEq/L)   CO2 27  19 - 32 (mEq/L)   Glucose, Bld 99  70 - 99 (mg/dL)   BUN 7  6 - 23 (mg/dL)   Creatinine, Ser 5.36  0.50 - 1.10 (mg/dL)   Calcium 8.7  8.4 - 64.4 (mg/dL)   Total Protein 6.6  6.0 - 8.3 (g/dL)   Albumin 3.6  3.5 - 5.2 (g/dL)   AST 18  0 - 37 (U/L)   ALT 11  0 - 35 (U/L)   Alkaline Phosphatase 48  39 - 117 (U/L)   Total Bilirubin 1.1  0.3 - 1.2 (mg/dL)   GFR calc non Af Amer >90  >90 (mL/min)   GFR calc Af Amer >90  >90 (mL/min)  PRO B NATRIURETIC PEPTIDE     Status: Normal   Collection Time   08/16/11  6:44 AM      Component Value Range   Pro B Natriuretic peptide (BNP) 18.0  0 - 125 (pg/mL)  MAGNESIUM     Status: Normal   Collection Time   08/16/11  6:44 AM      Component Value Range   Magnesium 1.9  1.5 - 2.5 (mg/dL)  PHOSPHORUS     Status: Normal   Collection Time   08/16/11  6:44 AM      Component Value Range   Phosphorus 4.4  2.3 - 4.6 (mg/dL)    Studies/Results: No results found.   Medications:  Allergies  Allergen Reactions  . Toradol Hives and Itching    Current Facility-Administered Medications  Medication Dose Route Frequency Provider Last Rate Last Dose  . antiseptic oral rinse (BIOTENE) solution 15 mL  15 mL  Mouth Rinse q12n4p Gwenyth Bender, MD      . azithromycin [facility]) tablet 250 mg  250 mg Oral Daily Gwenyth Bender, MD   250 mg at 08/16/11 0949  . chlorhexidine (PERIDEX) 0.12 % solution 15 mL  15 mL Mouth Rinse BID Gwenyth Bender, MD   15 mL at 08/16/11 0751  . cholecalciferol (VITAMIN D) tablet 1,000 Units  1,000 Units Oral Daily Gwenyth Bender, MD   1,000 Units at 08/16/11 [phone]  . dextrose 5 %-0.45 % sodium chloride infusion   Intravenous Continuous Keitha Butte, NP 75 mL/hr at 08/16/11 0049    . diphenhydrAMINE (BENADRYL) capsule 25-50 mg  25-50 mg Oral Q4H PRN Gwenyth Bender, MD      . diphenhydrAMINE (BENADRYL) injection 25 mg  25 mg Intravenous Q4H PRN Gwenyth Bender, MD   25 mg at 08/14/11 2345  . enoxaparin (LOVENOX) injection 40 mg  40 mg Subcutaneous Q24H Annia Belt, PHARMD   40 mg at 08/15/11 1623  . fluvoxaMINE (LUVOX) tablet 50 mg  50 mg Oral QHS Keitha Butte, NP      . folic acid (FOLVITE) tablet 1 mg  1 mg Oral Daily Gwenyth Bender, MD   1 mg at 08/16/11 0949  . HYDROmorphone (DILAUDID) PCA injection 0.3 mg/mL   Intravenous Q4H Keitha Butte, NP   1.5 mg at 08/16/11 1153  . HYDROmorphone PCA 0.3 mg/mL (DILAUDID) 0.3 mg/mL infusion           . hydroxyurea (HYDREA) capsule 1,000 mg  1,000 mg Oral Daily Gwenyth Bender, MD   1,000 mg at 08/16/11 0950  . ibuprofen (ADVIL,MOTRIN) tablet 800 mg  800 mg Oral TID Keitha Butte, NP      . lidocaine (LIDODERM) 5 % 1 patch  1 patch Transdermal Q24H Gwenyth Bender, MD   1 patch at 08/16/11 1152  . naloxone [facility]) injection 0.4 mg  0.4 mg Intravenous PRN Gwenyth Bender, MD       And  . sodium chloride 0.9 % injection 9 mL  9 mL Intravenous PRN Gwenyth Bender, MD      . pantoprazole (PROTONIX) EC tablet 40 mg  40 mg Oral BID AC Gwenyth Bender, MD   40 mg at 08/16/11 0750  . polyethylene glycol (MIRALAX / GLYCOLAX) packet 17 g  17 g Oral  Daily Keitha Butte, NP   17 g at 08/16/11 0948  . prenatal multivitamin tablet 1 tablet  1 tablet  Oral Daily Gwenyth Bender, MD   1 tablet at 08/16/11 [phone]  . promethazine (PHENERGAN) injection 12.5-25 mg  12.5-25 mg Intravenous Q6H Keitha Butte, NP   12.5 mg at 08/16/11 0756  . senna-docusate (Senokot-S) tablet 1 tablet  1 tablet Oral QHS Keitha Butte, NP      . SUMAtriptan (IMITREX) tablet 50 mg  50 mg Oral Q2H PRN Keitha Butte, NP      . DISCONTD: ibuprofen (ADVIL,MOTRIN) tablet 800 mg  800 mg Oral TID Keitha Butte, NP   800 mg at 08/16/11 0981     Assessment/Plan: Patient Active Problem List  Diagnoses  . SICKLE CELL ANEMIA  . TRICHOTILLOMANIA  . Active smoker  . Back pain  . Depression  . GERD (gastroesophageal reflux disease)  . Contraception management  . Stress  . Overweight    Sickle Cell Crisis:  The patient is not in active hemolysis.  Will continue pain/nausea/pruritis/bowel management, IV hydration, Folic Acid, Hydroxyurea and GI/DVT prophylaxis.  The patient is scheduled to work with PT for her chronic back pain.   Ibuprofen and Phenergan have been scheduled for the next 24 hours.  CMP and CBC in the am.  Hemoglobinopathy, Ferritin and Vitamin D level are pending. Trichotillomania/Anxiety/Depression/possible ADHD:  Behavioral Health suggested that the patient receive an outpatient ADHD evaluation and referral to outpatient psychiatry for continued therapy when discharged.  The patient will be started on Luvox 50 mg PO daily.    Discussed and agreed upon plan of care with the patient.   The plan of care will be adjusted based on the patient's clinical progress. Progress to discharge within the next 24-48 hours.   Larina Bras, NP-C 08/16/2011, 12:47 PM

## 2011-08-16 NOTE — Progress Notes (Signed)
Pt c/o constipation and would like more miralax reporting not taking the dose earlier this am.  Notified MD. Received new orders. Will continue to monitor pt. [name] 8:19 PM 08/16/2011

## 2011-08-16 NOTE — Progress Notes (Signed)
PT Cancellation Note  Evaluation cancelled today due to patient's refusal to participate.  Pt reports too much pain and to check back in an hour.  Upon checking back with pt, she was receiving back massage from significant other (still with back pain) and declined again.  [name],KATHrine E 08/16/2011, 3:39 PM Pager: [phone]

## 2011-08-17 LAB — COMPREHENSIVE METABOLIC PANEL
Alkaline Phosphatase: 51 U/L (ref 39–117)
BUN: 8 mg/dL (ref 6–23)
Chloride: 102 mEq/L (ref 96–112)
GFR calc Af Amer: >90 mL/min (ref >90)
Glucose, Bld: 123 mg/dL — ABNORMAL HIGH (ref 70–99)
Potassium: 3.4 mEq/L — ABNORMAL LOW (ref 3.5–5.1)
Total Bilirubin: 1.3 mg/dL — ABNORMAL HIGH (ref 0.3–1.2)

## 2011-08-17 LAB — CBC
HCT: 25.7 % — ABNORMAL LOW (ref 36.0–46.0)
Hemoglobin: 9.2 g/dL — ABNORMAL LOW (ref 12.0–15.0)
RBC: 2.75 MIL/uL — ABNORMAL LOW (ref 3.87–5.11)
WBC: 9.3 10*3/uL (ref 4.0–10.5)

## 2011-08-17 MED ORDER — HYDROMORPHONE 0.3 MG/ML IV SOLN
INTRAVENOUS | Status: AC
  Filled 2011-08-17: qty 25

## 2011-08-17 MED ORDER — FLUVOXAMINE MALEATE 50 MG PO TABS: 50.0000 mg | ORAL_TABLET | Freq: Every day | ORAL | Status: DC

## 2011-08-17 MED ORDER — IBUPROFEN 800 MG PO TABS: 800.0000 mg | ORAL_TABLET | Freq: Three times a day (TID) | ORAL | Status: AC

## 2011-08-17 MED ORDER — OXYCODONE HCL 10 MG PO TABS: 10.0000 mg | ORAL_TABLET | Freq: Four times a day (QID) | ORAL | Status: DC | PRN

## 2011-08-17 MED ORDER — LIDOCAINE 5 % EX PTCH: 1.0000 | MEDICATED_PATCH | CUTANEOUS | Status: DC

## 2011-08-17 MED ORDER — SUMATRIPTAN SUCCINATE 50 MG PO TABS: 50.0000 mg | ORAL_TABLET | ORAL | Status: DC | PRN

## 2011-08-17 MED ORDER — FOLIC ACID 1 MG PO TABS: 1.0000 mg | ORAL_TABLET | Freq: Every day | ORAL | Status: DC

## 2011-08-17 MED ORDER — POTASSIUM CHLORIDE CRYS ER 20 MEQ PO TBCR
40.0000 meq | EXTENDED_RELEASE_TABLET | Freq: Once | ORAL | Status: DC
  Filled 2011-08-17: qty 2

## 2011-08-17 MED ORDER — VITAMIN D3 25 MCG (1000 UNIT) PO TABS: 1000.0000 [IU] | ORAL_TABLET | Freq: Every day | ORAL | Status: DC

## 2011-08-17 NOTE — Progress Notes (Signed)
Physical Therapy Note  Order received. Chart reviewed. Spoke briefly with pt who stated she was discharging home today. Pt stated boyfriend will be able to assist if needed. Pt also stated she has been mobilizing in room without assist and she doesn't feel she has any PT needs at this time. PT will sign off. Thanks.   Rebeca Alert, PT  [phone]

## 2011-08-18 NOTE — Discharge Summary (Signed)
Physician Discharge Summary  Patient ID: DEONE OMAHONEY MRN: [id number] DOB/AGE: 1991-04-18 21 y.o.  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 Patient was admitted for further treatment of her sickle cell crisis. She was started on IV fluids with half-normal saline at 125 cc an hour. She was placed on a PCA Dilaudid pump as well. She continued to have pain but this gradually improved over the subsequent days. She was noted to have significant anxiety disorder. She was seen by Dr. Ferol Luz of psychiatry. Her anxiety, depression and her trichotillomania was reviewed. Recommended that she start on Luvox. This was started on patient in the hospital which he tolerated well. Patient was briefly seen by physical therapy for low back. She however continued to improve quickly and did not require further input. Patient presently stable for discharge home. Further therapy will be done as an outpatient and or the sickle cell medicine Center. Her hemoglobin at the time of discharge was 9.2. Her total bilirubin was 1.3.   Significant Diagnostic Studies: None.  Disposition: 01-Home or Self Care  Discharge Orders    Future Appointments: Provider: Department: Dept Phone: Center:   08/22/2011 11:00 AM Kristen Cardinal, MD [facility] [phone] [facility]   08/24/2011 11:15 AM Lucy Antigua [facility] [phone] [facility]   08/31/2011 10:45 AM Lucy [facility] [phone] [facility]   09/07/2011 11:15 AM Lucy Antig[facility] [phone] [facility]     Future Orders Please Complete By Expires   Diet - low sodium heart healthy      Diet - low sodium heart healthy       Increase activity slowly      No wound care      Call MD for:  severe uncontrolled pain      Increase activity slowly      No wound care      Call MD for:  severe uncontrolled pain      Discharge instructions      Comments:   Follow up with primary care physician in 3 days.     Medication List  As of 08/18/2011  8:56 AM   TAKE these medications         cholecalciferol 1000 UNITS tablet   Commonly known as: VITAMIN D   Take 1 tablet (1,000 Units total) by mouth daily.      fluvoxaMINE 50 MG tablet   Commonly known as: LUVOX   Take 1 tablet (50 mg total) by mouth at bedtime.      folic acid 1 MG tablet   Commonly known as: FOLVITE   Take 1 mg by mouth daily.      folic acid 1 MG tablet   Commonly known as: FOLVITE   Take 1 tablet (1 mg total) by mouth daily.      hydroxyurea 500 MG capsule   Commonly known as: HYDREA   Take 1,000 mg by mouth every morning. May take with food to minimize GI side effects.      ibuprofen 800 MG tablet   Commonly known as: ADVIL,MOTRIN   Take 1 tablet (800 mg total) by mouth 3 (three) times daily.      lidocaine 5 %   Commonly known as:  LIDODERM   Place 1 patch onto the skin daily. Remove & Discard patch within 12 hours or as directed by MD      Oxycodone HCl 10 MG Tabs   Take 1 tablet (10 mg total) by mouth 4 (four) times daily as needed. For pain.      pantoprazole 40 MG tablet   Commonly known as: PROTONIX   Take 40 mg by mouth daily at 12 noon.      prenatal multivitamin 60-1 MG tablet   Take 1 tablet by mouth every morning.      SUMAtriptan 50 MG tablet   Commonly known as: IMITREX   Take 1 tablet (50 mg total) by mouth every 2 (two) hours as needed.           Follow-up Information    Follow up with August Saucer Temima Kutsch, MD on 08/29/2011. (Appt time: 12:30 pm)    Contact information:   [address] [facility] [place] [address] [phone]          Signed: August Saucer, [place] Coffel 08/18/2011,  8:56 AM

## 2011-08-22 ENCOUNTER — Ambulatory Visit (INDEPENDENT_AMBULATORY_CARE_PROVIDER_SITE_OTHER): Payer: Medicaid Other | Admitting: Family Medicine

## 2011-08-22 ENCOUNTER — Encounter: Payer: Self-pay | Admitting: Family Medicine

## 2011-08-22 VITALS — BP 118/79 | HR 92 | Ht 62.5 in | Wt 178.5 lb

## 2011-08-22 DIAGNOSIS — M549 Dorsalgia, unspecified: Secondary | ICD-10-CM

## 2011-08-22 DIAGNOSIS — F6389 Other impulse disorders: Secondary | ICD-10-CM

## 2011-08-22 DIAGNOSIS — D571 Sickle-cell disease without crisis: Secondary | ICD-10-CM

## 2011-08-22 DIAGNOSIS — Z309 Encounter for contraceptive management, unspecified: Secondary | ICD-10-CM

## 2011-08-22 MED ORDER — HYDROXYUREA 500 MG PO CAPS: 1000.0000 mg | ORAL_CAPSULE | Freq: Every day | ORAL | Status: DC

## 2011-08-22 NOTE — Patient Instructions (Signed)
Back pain: Take ibuprofen as needed for back pain.  Go to PT as scheduled.   Sickle Cell: Go to your appointment with sickle cell center.  Talk to Dr. August Saucer about the oxycodone prescription. -- let him know about the problem that you had.   Birth control: Follow up with health department.   Anxiety/OCD: F/up with myself or preferably Dr. Ferol Luz- regarding the increase on the new medication- luvox.  And further treatment for anxiety.

## 2011-08-22 NOTE — Progress Notes (Signed)
  Subjective:    Patient ID: [name], female    DOB: 07-Nov-1990, 21 y.o.   MRN: [id number]  HPI Anxiety/ocd- Started fluvoxamine on 08/15/11- no side effects.  No increased jitteriness.  No increase in anxiety.  No changes in mood.  Taking daily, missed 1 dose in the past week. No gi problems.  No improvement of mood. No SI or HI.    Sickle Cell- D/c'd from the hospital last Wednesday after sickle cell crisis in arms. Appointment with the sickle cell center with Dr. August Saucer- on 4/15.  Out of hydroxyurea x 2 days.   Called Dr. Mikel Cella and asked for list of labwork, they were going to mail these requested lab results to patient, but pt has not received this letter of labwork that they would like me to test here and send to them.  Pain crisis in arms now resolved.   No fever. No chills.   Back pain- Upper, mid, and lower back pain. 4/10.  Constant.  Laying down makes it better.  Bending, picking up things makes it worse.  Stretches make it better.   Hurts "all over" her back.  Doesn't hurt too badly at work,  Works 3-4 hours per day- At [employer]. Pt did not go to PT after last office visit.  States that this was never scheduled. PT 1 x week. Physical therapy scheduled weekly.  Going Thursday.  Using oxycodone and ibuprofen.  No fever. No bowel or bladder incontinence.    Birth Control- Health department canceled last appt and they reschedule it for 4/ 24.  Plans on discussing bcp with them- they provide these for free.    SH: Plans to be moving out of mother's home into her own apartment within the next month.    Review of Systems As per above.     Objective:   Physical Exam  Constitutional: She appears well-developed and well-nourished.  HENT:  Head: Normocephalic and atraumatic.  Cardiovascular: Normal rate, regular rhythm and normal heart sounds.   No murmur heard. Pulmonary/Chest: Effort normal. No respiratory distress. She has no wheezes. She has no rales.  Abdominal: Soft. She  exhibits no distension.  Musculoskeletal: She exhibits no edema.       No tenderness on back exam today.  Some "tightness" experienced with ROM- but ROM normal.  Strength normal 5/5 in upper and lower ext.  Reflexes normal and equal bilateral.  Normal sensation.    Skin: No rash noted.  Psychiatric: She has a normal mood and affect. Her behavior is normal. Judgment and thought content normal.       No SI or HI.  In NAD.           Assessment & Plan:

## 2011-08-23 NOTE — Assessment & Plan Note (Signed)
Pt's appt with health department has been rescheduled for this week.  Pt to go as scheduled for birth control management.

## 2011-08-23 NOTE — Assessment & Plan Note (Signed)
Pt tolerating luvox (ssri) well.  Pt to be seen in upcoming week at S[facility]le cell clinic- she states that Dr. Ferol Luz who saw her inpatient is part of this clinic.  Therefore, pt to f/up there for continued titration and management of this medication.

## 2011-08-23 NOTE — Assessment & Plan Note (Signed)
Pt pain crisis now resolved- pain was in arms during last hospitalization.  D/c on 4/1.  Pt to f/up with with sickle cell clinic- Dr. August Saucer in upcoming well.  Will give refill for hydroxyurea--to get patient through until this f/up appt .  Pt has had difficulty with compliance and obtaining f/up labs that are needed for this medication.  Therefore, pt to discuss with Dr. August Saucer if she should continue this medication.  Will need monitoring lab work at this sickle cell center if pt is going to continue on hydroxyurea.  I have not drawn any here in the clinic.  Some labs obtained during last hospitalization.

## 2011-08-23 NOTE — Assessment & Plan Note (Signed)
Pt to take motrin for discomfort or muscle relaxer prn.  Pt to f/up with PT as scheduled for upcoming week. Return to see me after Pt regimen or if any new or worsening of symptoms.

## 2011-08-24 ENCOUNTER — Ambulatory Visit: Payer: Medicaid Other | Attending: Family Medicine

## 2011-08-24 DIAGNOSIS — R293 Abnormal posture: Secondary | ICD-10-CM | POA: Insufficient documentation

## 2011-08-24 DIAGNOSIS — IMO0001 Reserved for inherently not codable concepts without codable children: Secondary | ICD-10-CM | POA: Insufficient documentation

## 2011-08-24 DIAGNOSIS — M2550 Pain in unspecified joint: Secondary | ICD-10-CM | POA: Insufficient documentation

## 2011-08-24 DIAGNOSIS — M2560 Stiffness of unspecified joint, not elsewhere classified: Secondary | ICD-10-CM | POA: Insufficient documentation

## 2011-08-29 ENCOUNTER — Emergency Department (HOSPITAL_COMMUNITY)
Admission: EM | Admit: 2011-08-29 | Discharge: 2011-08-29 | Discharge status: Against Medical Advice | Payer: Medicaid Other | Attending: Emergency Medicine | Admitting: Emergency Medicine

## 2011-08-29 ENCOUNTER — Encounter (HOSPITAL_COMMUNITY): Payer: Self-pay | Admitting: Emergency Medicine

## 2011-08-29 DIAGNOSIS — R1084 Generalized abdominal pain: Secondary | ICD-10-CM | POA: Insufficient documentation

## 2011-08-29 NOTE — ED Notes (Addendum)
Pt reports she has diffuse abdominal pain starting today at noon; feels like something is poking her. Pt just had bowel movement. Reports one episode of vomiting. No diar.

## 2011-08-29 NOTE — ED Notes (Signed)
Urine collected.

## 2011-08-31 ENCOUNTER — Ambulatory Visit: Payer: Medicaid Other | Admitting: Rehabilitation

## 2011-09-04 ENCOUNTER — Observation Stay (HOSPITAL_COMMUNITY)
Admission: EM | Admit: 2011-09-04 | Discharge: 2011-09-05 | Disposition: A | Discharge status: Home / Self Care | Payer: Medicaid Other | Source: Ambulatory Visit | Attending: Emergency Medicine | Admitting: Emergency Medicine

## 2011-09-04 ENCOUNTER — Encounter (HOSPITAL_COMMUNITY): Payer: Self-pay | Admitting: Emergency Medicine

## 2011-09-04 DIAGNOSIS — D5700 Hb-SS disease with crisis, unspecified: Principal | ICD-10-CM | POA: Insufficient documentation

## 2011-09-04 LAB — POCT I-STAT, CHEM 8
BUN: 9 mg/dL (ref 6–23)
Calcium, Ion: 1.26 mmol/L (ref 1.12–1.32)
Chloride: 108 mEq/L (ref 96–112)
Creatinine, Ser: 0.6 mg/dL (ref 0.50–1.10)
Glucose, Bld: 93 mg/dL (ref 70–99)
HCT: 33 % — ABNORMAL LOW (ref 36.0–46.0)
Hemoglobin: 11.2 g/dL — ABNORMAL LOW (ref 12.0–15.0)
Potassium: 3.9 mEq/L (ref 3.5–5.1)
Sodium: 144 mEq/L (ref 135–145)
TCO2: 23 mmol/L (ref 0–100)

## 2011-09-04 LAB — RETICULOCYTES: Retic Count, Absolute: 294.9 10*3/uL — ABNORMAL HIGH (ref 19.0–186.0)

## 2011-09-04 LAB — POCT PREGNANCY, URINE: Preg Test, Ur: NEGATIVE

## 2011-09-04 LAB — DIFFERENTIAL
Basophils Absolute: 0.1 10*3/uL (ref 0.0–0.1)
Basophils Relative: 0 % (ref 0–1)
Eosinophils Relative: 4 % (ref 0–5)
Lymphocytes Relative: 29 % (ref 12–46)
Neutro Abs: 8.2 10*3/uL — ABNORMAL HIGH (ref 1.7–7.7)

## 2011-09-04 LAB — CBC
MCHC: 36.6 g/dL — ABNORMAL HIGH (ref 30.0–36.0)
MCV: 91.8 fL (ref 78.0–100.0)
Platelets: 461 10*3/uL — ABNORMAL HIGH (ref 150–400)
RDW: 15.3 % (ref 11.5–15.5)
WBC: 13.8 10*3/uL — ABNORMAL HIGH (ref 4.0–10.5)

## 2011-09-04 IMAGING — CR DG CHEST 2V
2 series · 2 of 2 positions shown · non-contrast
Comparison: 09/24/2010

CLINICAL DATA: Fever, pain and sickle cell anemia.

CHEST - 2 VIEW

[w chest pa]
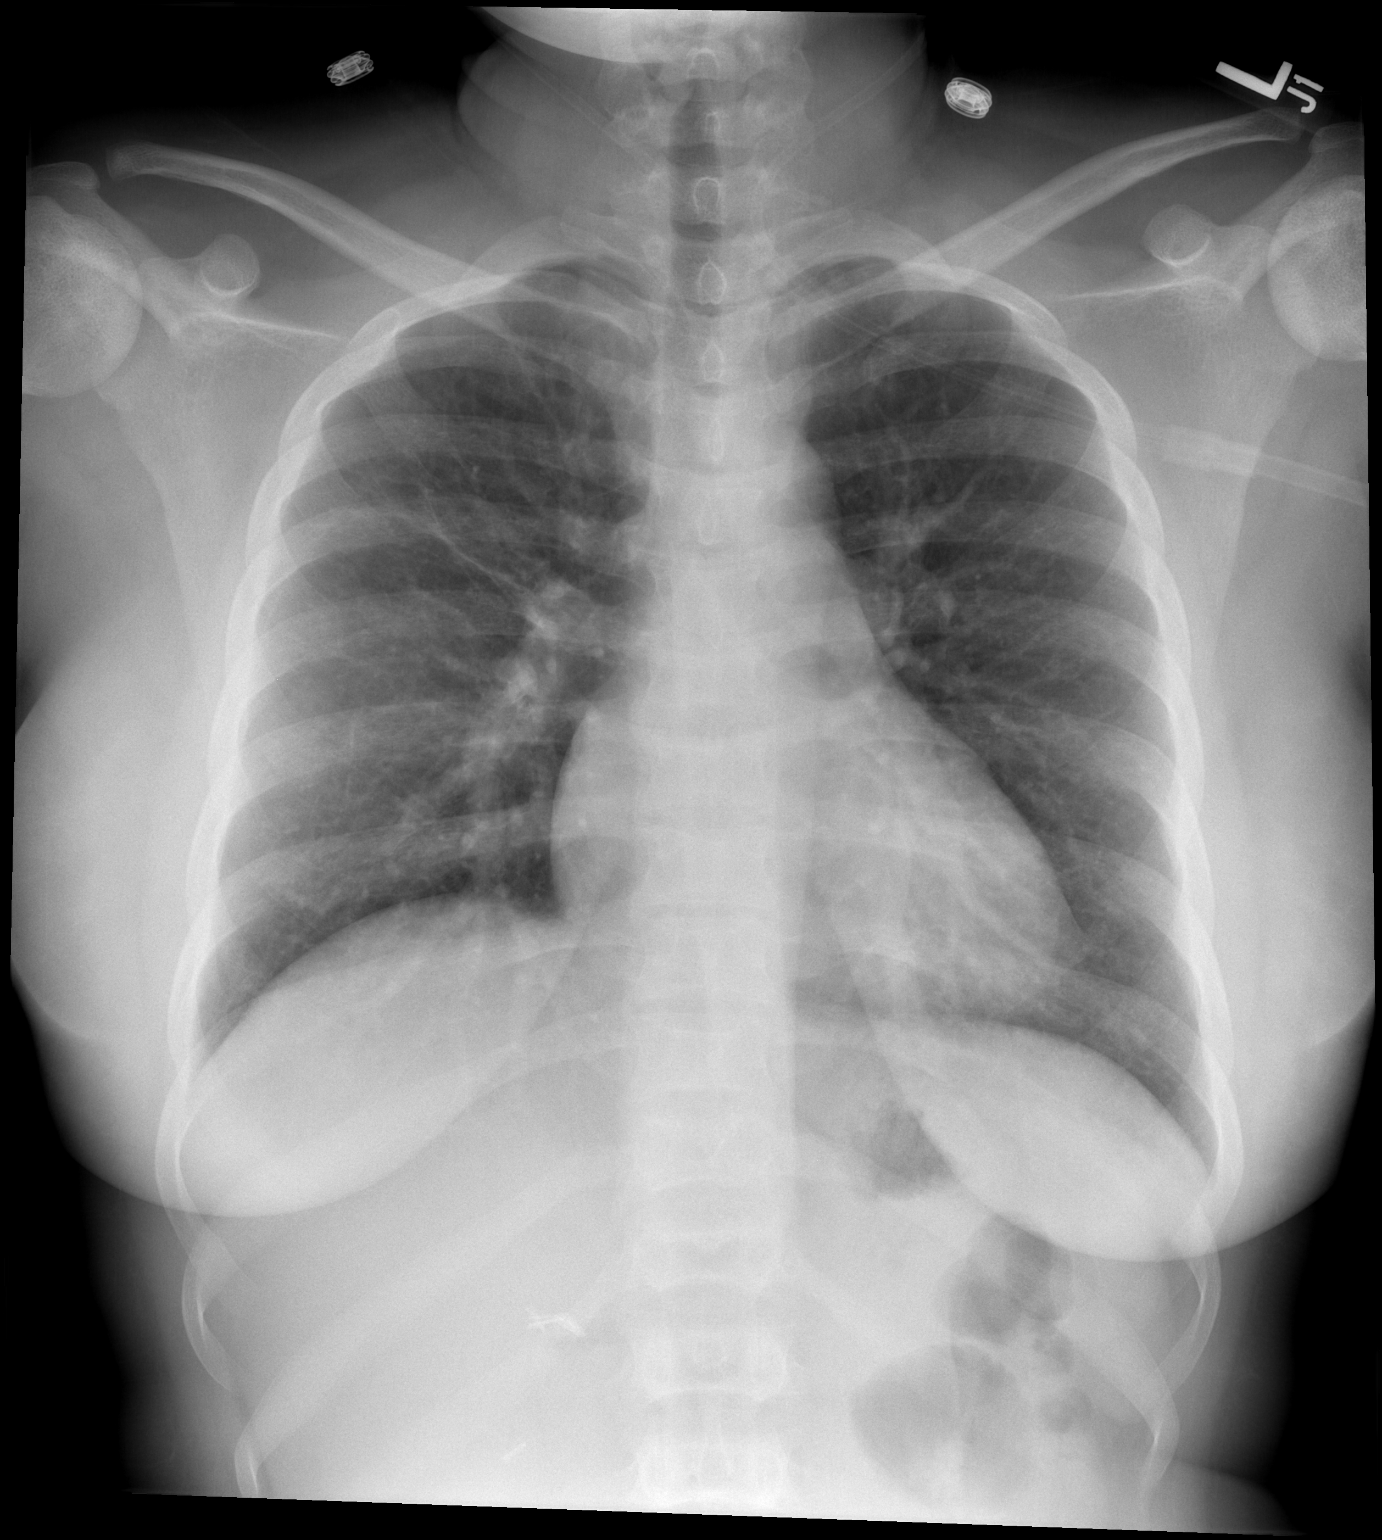

[w chest lat]
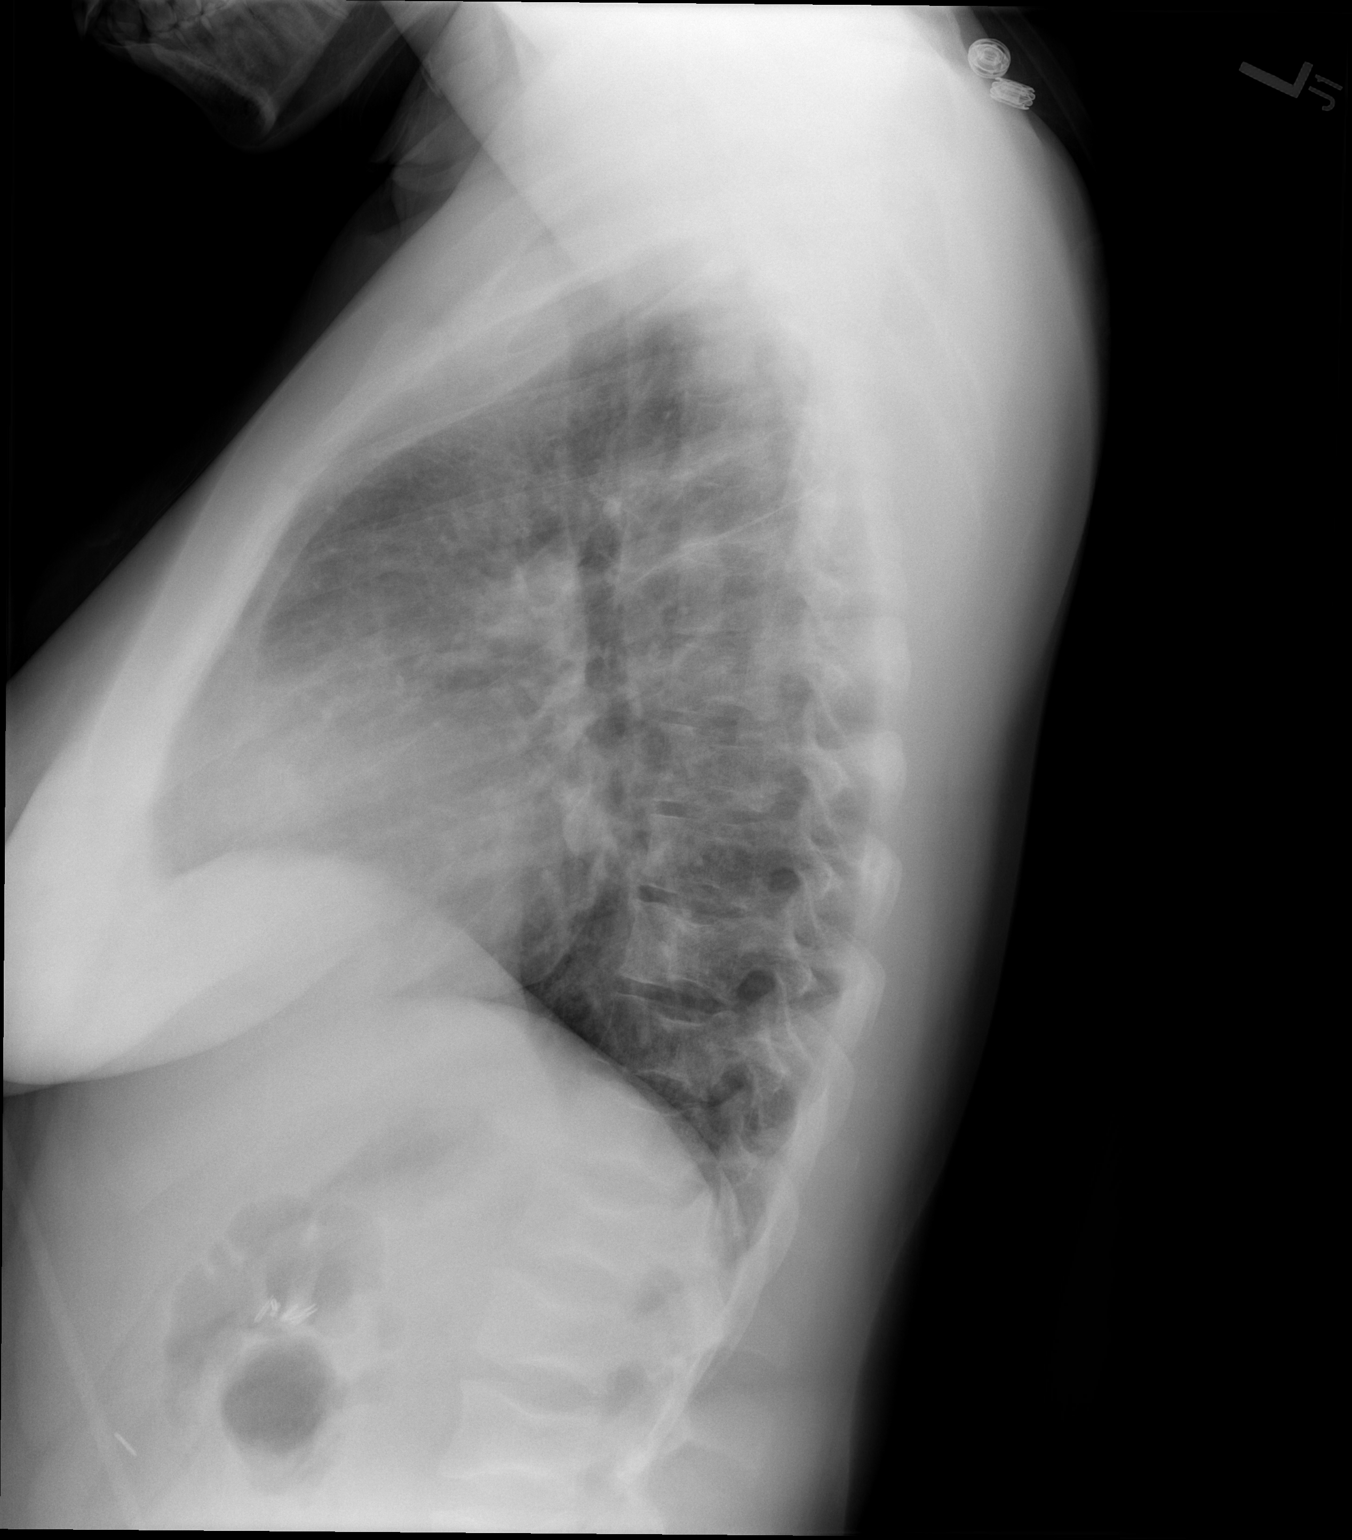

[2 of 2 positions shown; findings below may reference images not displayed]

FINDINGS: Stable pulmonary scarring.  No edema, infiltrate or
pleural effusion.  Cardiac and mediastinal contours are within
normal limits.  Bony thorax is stable in appearance.
IMPRESSION: Stable scarring.  No active disease.

## 2011-09-04 MED ORDER — ONDANSETRON HCL 4 MG/2ML IJ SOLN
4.0000 mg | Freq: Once | INTRAMUSCULAR | Status: DC
  Filled 2011-09-04: qty 2

## 2011-09-04 MED ORDER — MORPHINE SULFATE 4 MG/ML IJ SOLN
4.0000 mg | INTRAMUSCULAR | Status: DC | PRN
  Filled 2011-09-04: qty 1

## 2011-09-04 MED ORDER — MORPHINE SULFATE 4 MG/ML IJ SOLN
4.0000 mg | INTRAMUSCULAR | Status: DC | PRN
  Administered 2011-09-04 – 2011-09-05 (×2): 4 mg via INTRAMUSCULAR
  Filled 2011-09-04: qty 1

## 2011-09-04 MED ORDER — ONDANSETRON 4 MG PO TBDP
4.0000 mg | ORAL_TABLET | Freq: Once | ORAL | Status: AC
  Administered 2011-09-04: 4 mg via ORAL
  Filled 2011-09-04: qty 1

## 2011-09-04 MED ORDER — SODIUM CHLORIDE 0.9 % IV BOLUS (SEPSIS)
1000.0000 mL | Freq: Once | INTRAVENOUS | Status: AC
  Administered 2011-09-05: 1000 mL via INTRAVENOUS

## 2011-09-04 NOTE — ED Notes (Signed)
IV Team paged 

## 2011-09-04 NOTE — ED Notes (Signed)
C/o sickle cell pain to L leg and upper back since 2:30pm today.  States she may be pregnant.

## 2011-09-04 NOTE — ED Provider Notes (Deleted)
Patient has right upper back pain, which is typical of her sickle cell crises.  This started this afternoon about 2:30 she unfortunately does not have any of her pain medication at home, which is oxycodone 10 mg, which she takes on a daily basis.  [facility].  She denies shortness of breath.  Injury, chest pain, diaphoresis.  She says she has frequent sickle cell crises last hospitalization was about one month ago.  There is also a concern that she may be pregnant.  Her last menstrual cycle was March 19.  She does not use any prophylactic protection to prevent pregnancy.  4:09 States starting to feel better would like to go home but is out of her routine home meds which include 10 mg Oxycodone QID Arman Filter, NP 09/04/11 2127  Arman Filter, NP 09/05/11 [phone]

## 2011-09-04 NOTE — ED Notes (Signed)
Pt complaining of left leg pain and back pain since 1430 this afternoon.  Pt reports pain is from sickle cell crisis.

## 2011-09-05 ENCOUNTER — Ambulatory Visit: Payer: Medicaid Other | Admitting: Family Medicine

## 2011-09-05 IMAGING — CR DG CHEST 1V PORT
1 series · 1 of 1 positions shown · non-contrast
Comparison: 10/20/2010.

CLINICAL DATA: Chest pain.  Fever.  Cough.  Sickle cell crisis.
Shortness of breath.

PORTABLE CHEST - 1 VIEW

[view not recorded]
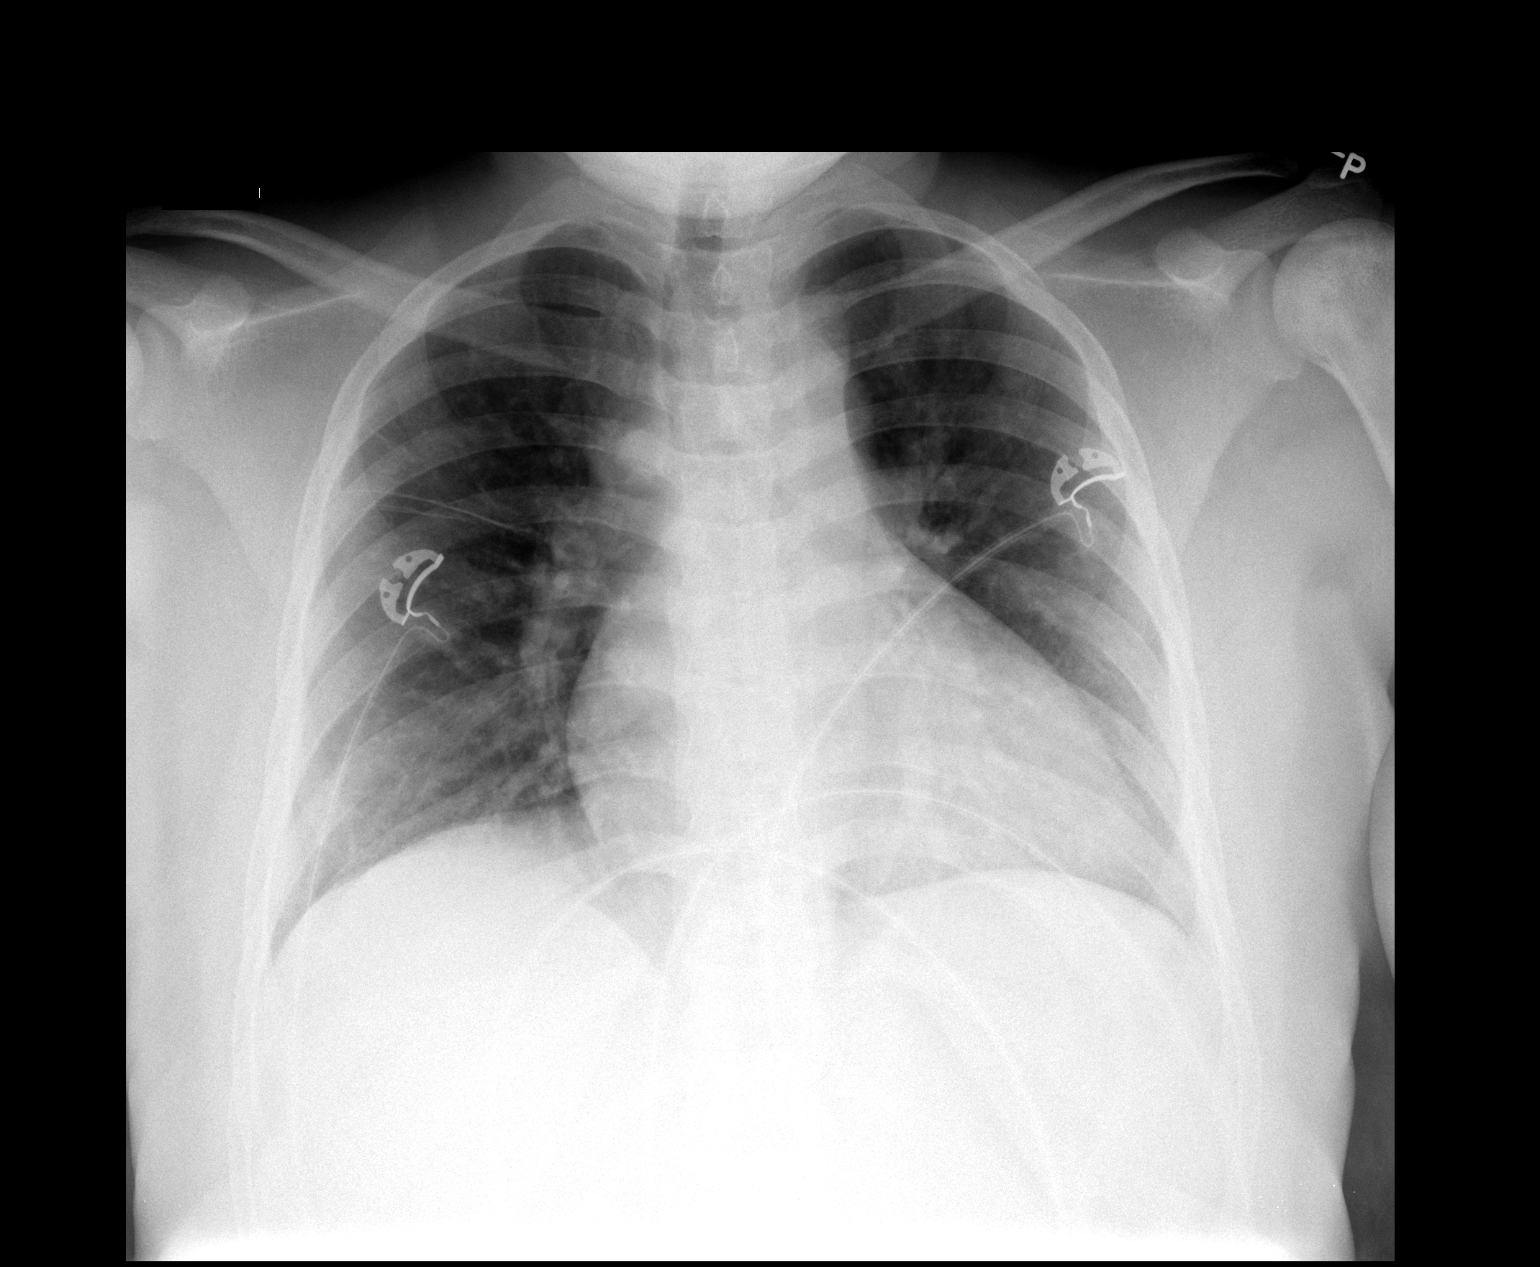

[1 of 1 positions shown; findings below may reference images not displayed]

FINDINGS: Cardiomegaly.  Central pulmonary vascular prominence.  No
segmental infiltrate or pneumothorax.
IMPRESSION: Cardiomegaly.

Central pulmonary vascular prominence.

No segmental consolidation.

Bony changes left humeral head may represent result of infarcts
from sickle cell disease.

## 2011-09-05 MED ORDER — OXYCODONE HCL 5 MG PO TABS: 10.0000 mg | ORAL_TABLET | Freq: Four times a day (QID) | ORAL | Status: AC | PRN

## 2011-09-05 MED ORDER — SODIUM CHLORIDE 0.9 % IV SOLN: 1000.0000 mL | Freq: Once | INTRAVENOUS | Status: DC

## 2011-09-05 MED ORDER — HYDROMORPHONE HCL PF 1 MG/ML IJ SOLN: 1.0000 mg | Freq: Once | INTRAMUSCULAR | Status: DC

## 2011-09-05 MED ORDER — SODIUM CHLORIDE 0.9 % IV SOLN: 1000.0000 mL | INTRAVENOUS | Status: DC

## 2011-09-05 MED ORDER — ONDANSETRON HCL 4 MG/2ML IJ SOLN
4.0000 mg | Freq: Four times a day (QID) | INTRAMUSCULAR | Status: DC | PRN
  Administered 2011-09-05: 4 mg via INTRAVENOUS
  Filled 2011-09-05: qty 2

## 2011-09-05 MED ORDER — HYDROMORPHONE HCL PF 1 MG/ML IJ SOLN
1.0000 mg | INTRAMUSCULAR | Status: DC | PRN
  Administered 2011-09-05: 1 mg via INTRAVENOUS
  Filled 2011-09-05: qty 1

## 2011-09-05 MED ORDER — ZOLPIDEM TARTRATE 5 MG PO TABS
5.0000 mg | ORAL_TABLET | Freq: Every evening | ORAL | Status: DC | PRN
  Administered 2011-09-05: 5 mg via ORAL
  Filled 2011-09-05: qty 1

## 2011-09-05 MED ORDER — ACETAMINOPHEN 325 MG PO TABS: 650.0000 mg | ORAL_TABLET | ORAL | Status: DC | PRN

## 2011-09-05 MED ORDER — HYDROMORPHONE HCL PF 1 MG/ML IJ SOLN
1.0000 mg | Freq: Once | INTRAMUSCULAR | Status: AC
  Administered 2011-09-05: 1 mg via INTRAVENOUS
  Filled 2011-09-05: qty 1

## 2011-09-05 MED ORDER — OXYCODONE-ACETAMINOPHEN 5-325 MG PO TABS
1.0000 | ORAL_TABLET | ORAL | Status: DC | PRN
  Administered 2011-09-05: 1 via ORAL
  Filled 2011-09-05: qty 1

## 2011-09-05 MED ORDER — DIPHENHYDRAMINE HCL 50 MG/ML IJ SOLN: 12.5000 mg | Freq: Four times a day (QID) | INTRAMUSCULAR | Status: DC | PRN

## 2011-09-05 NOTE — Discharge Instructions (Signed)
Sickle Cell Pain Crisis Sickle cell anemia requires regular medical attention by your healthcare provider and awareness about when to seek medical care. Pain is a common problem in children with sickle cell disease. This usually starts at less than 20 year of age. Pain can occur nearly anywhere in the body but most commonly occurs in the extremities, back, chest, or belly (abdomen). Pain episodes can start suddenly or may follow an illness. These attacks can appear as decreased activity, loss of appetite, change in behavior, or simply complaints of pain. DIAGNOSIS   Specialized blood and gene testing can help make this diagnosis early in the disease. Blood tests may then be done to watch blood levels.   Specialized brain scans are done when there are problems in the brain during a crisis.   Lung testing may be done later in the disease.  HOME CARE INSTRUCTIONS   Maintain good hydration. Increase you or your child's fluid intake in hot weather and during exercise.   Avoid smoking. Smoking lowers the oxygen in the blood and can cause the production of sickle-shaped cells (sickling).   Control pain. Only take over-the-counter or prescription medicines for pain, discomfort, or fever as directed by your caregiver. Do not give aspirin to children because of the association with Reye's syndrome.   Keep regular health care checks to keep a proper red blood cell (hemoglobin) level. A moderate anemia level protects against sickling crises.   You and your child should receive all the same immunizations and care as the people around you.   Mothers should breastfeed their babies if possible. Use formulas with iron added if breastfeeding is not possible. Additional iron should not be given unless there is a lack of it. People with sickle cell disease (SCD) build up iron faster than normal. Give folic acid and additional vitamins as directed.   If you or your child has been prescribed antibiotics or other  medications to prevent problems, take them as directed.   Summer camps are available for children with SCD. They may help young people deal with their disease. The camps introduce them to other children with the same problem.   Young people with SCD may become frustrated or angry at their disease. This can cause rebellion and refusal to follow medical care. Help groups or counseling may help with these problems.   Wear a medical alert bracelet. When traveling, keep medical information, caregiver's names, and the medications you or your child takes with you at all times.  SEEK IMMEDIATE MEDICAL CARE IF:   You or your child develops dizziness or fainting, numbness in or difficulty with movement of arms and legs, difficulty with speech, or is acting abnormally. This could be early signs of a stroke. Immediate treatment is necessary.   You or your child has an oral temperature above 102 F (38.9 C), not controlled by medicine.   You or your child has other signs of infection (chills, lethargy, irritability, poor eating, vomiting). The younger the child, the more you should be concerned.   With fevers, do not give medicine to lower the fever right away. This could cover up a problem that is developing. Notify your caregiver.   You or your child develops pain that is not helped with medicine.   You or your child develops shortness of breath or is coughing up pus-like or bloody sputum.   You or your child develops any problems that are new and are causing you to worry.   You or   your child develops a persistent, often uncomfortable and painful penile erection. This is called priapism. Always check young boys for this. It is often embarrassing for them and they may not bring it to your attention. This is a medical emergency and needs immediate treatment. If this is not treated it will lead to impotence.   You or your child develops a new onset of abdominal pain, especially on the left side near the  stomach area.   You or your child has any questions or has problems that are not getting better. Return immediately if you feel your child is getting worse, even if your child was seen only a short while ago.  Document Released: 02/09/2005 Document Revised: 04/21/2011 Document Reviewed: 07/01/2009 [employer] Patient Information 2012 [employer]. 

## 2011-09-05 NOTE — ED Notes (Signed)
Alert, NAD, calm, interactive, given soda, boyfriend given soda and happy meal.

## 2011-09-06 IMAGING — US US ABDOMEN COMPLETE
1 series · 14 of 25 positions shown · non-contrast
Comparison: 06/28/2010 CT

CLINICAL DATA: Abdominal pain

COMPLETE ABDOMINAL ULTRASOUND

[Series 1: us abdomen complete · 0.30mm/px · 14 of 42 slices shown]
[im 1/42]
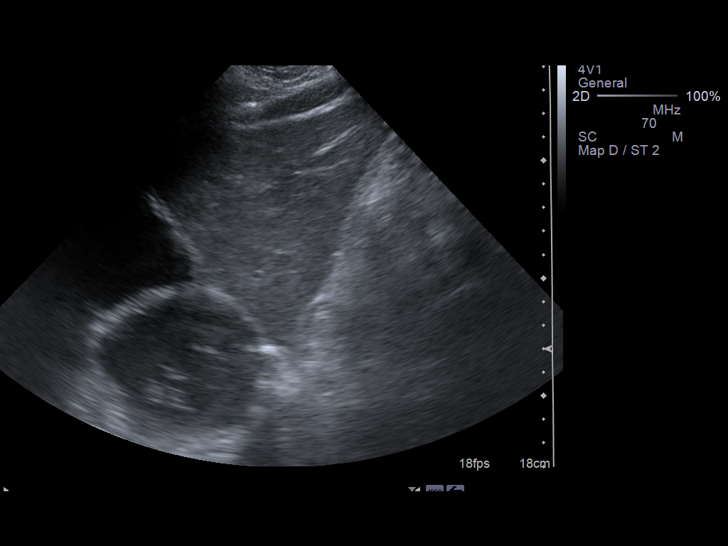
[im 4/42]
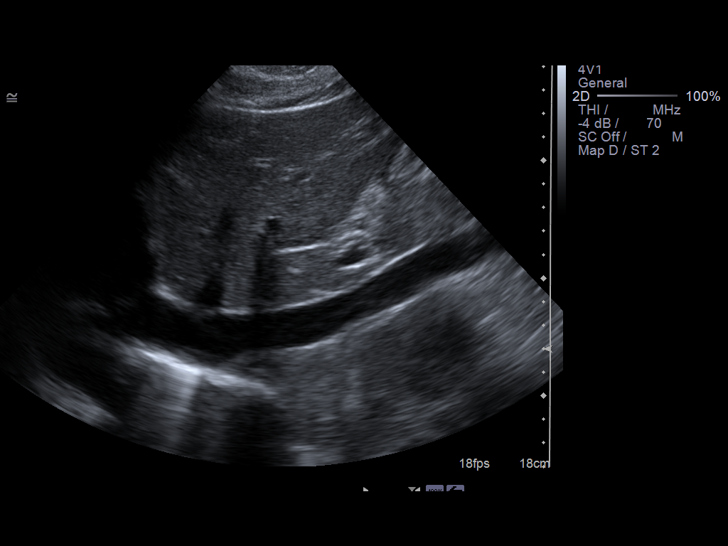
[im 7/42]
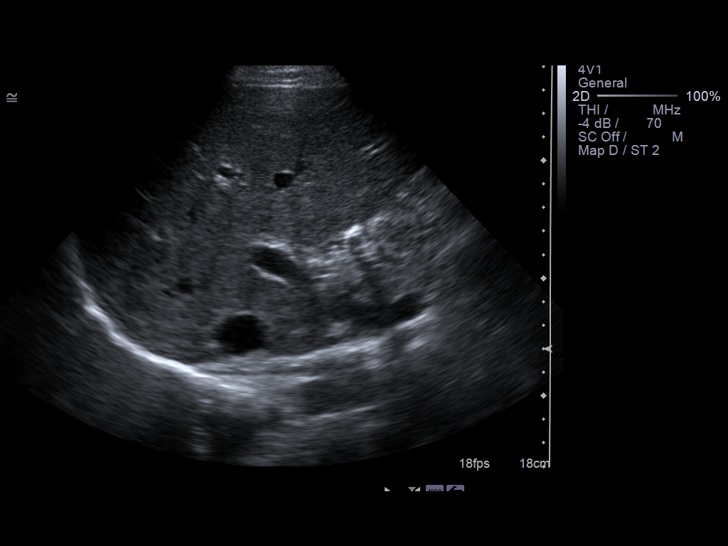
[im 11/42]
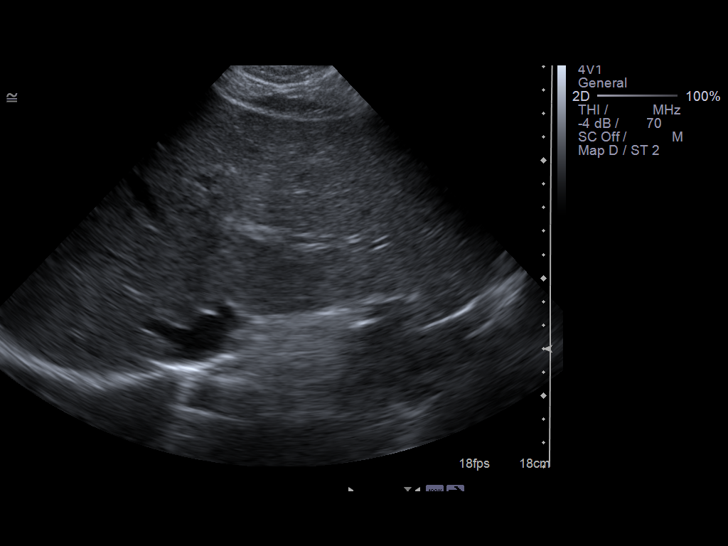
[im 14/42]
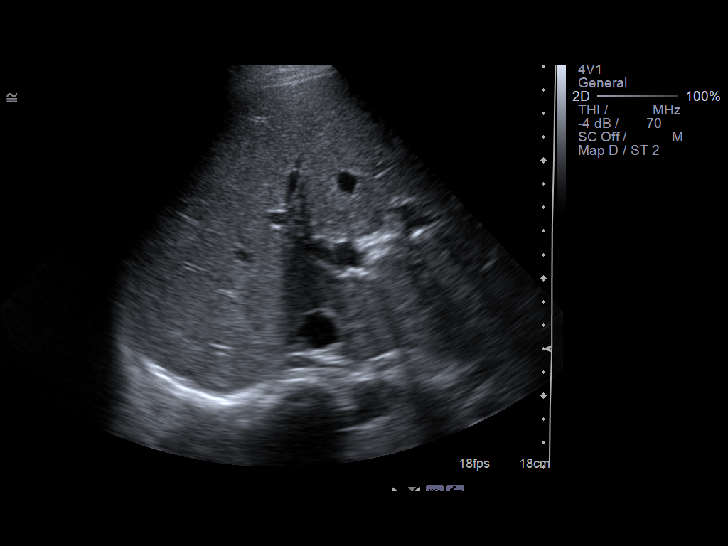
[im 16/42]
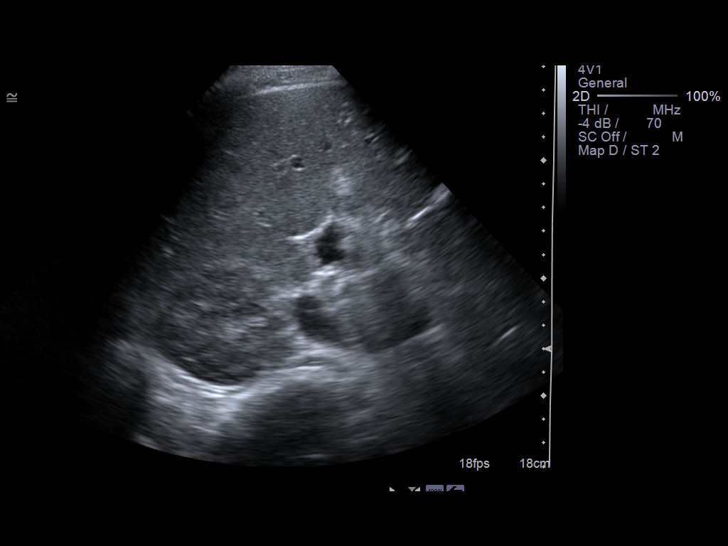
[im 19/42]
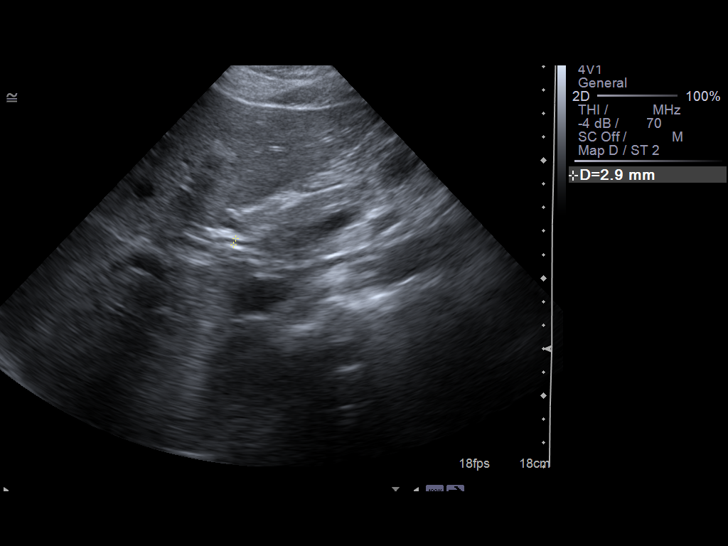
[im 23/42]
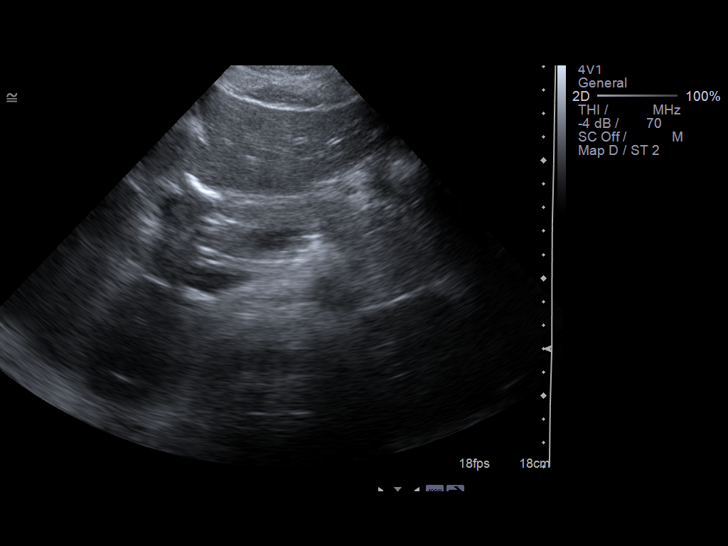
[im 26/42]
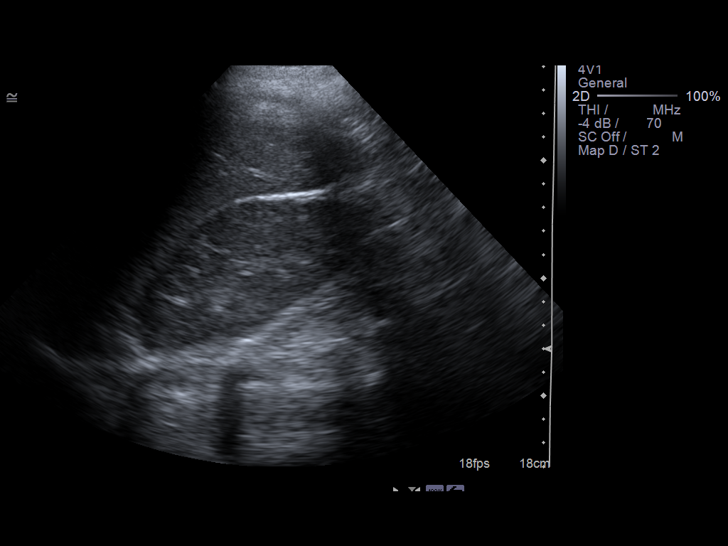
[im 28/42]
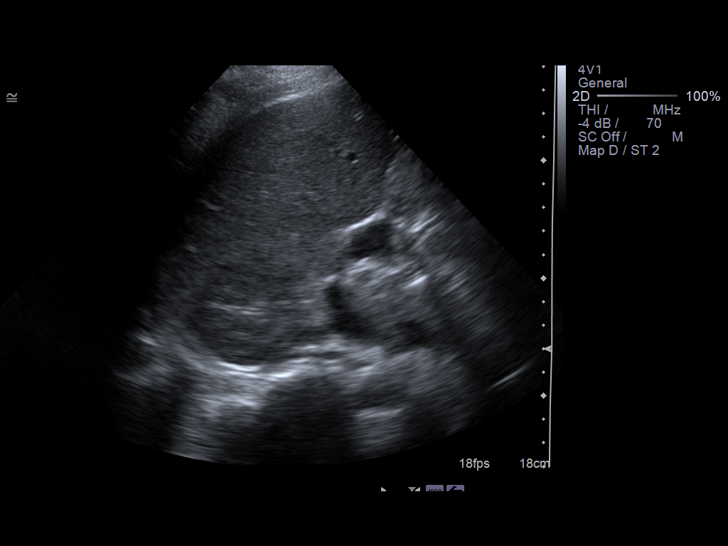
[im 31/42]
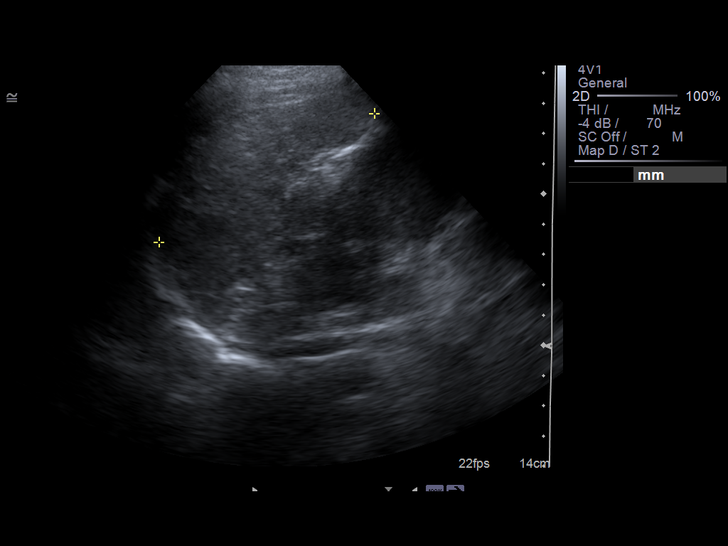
[im 35/42]
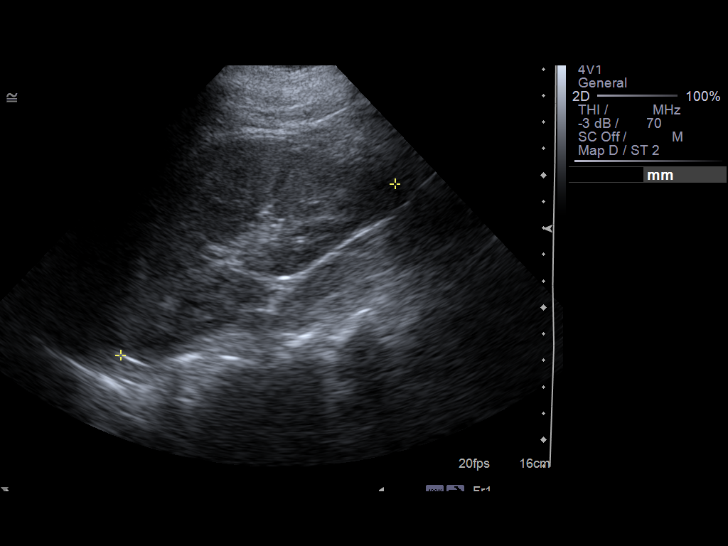
[im 38/42]
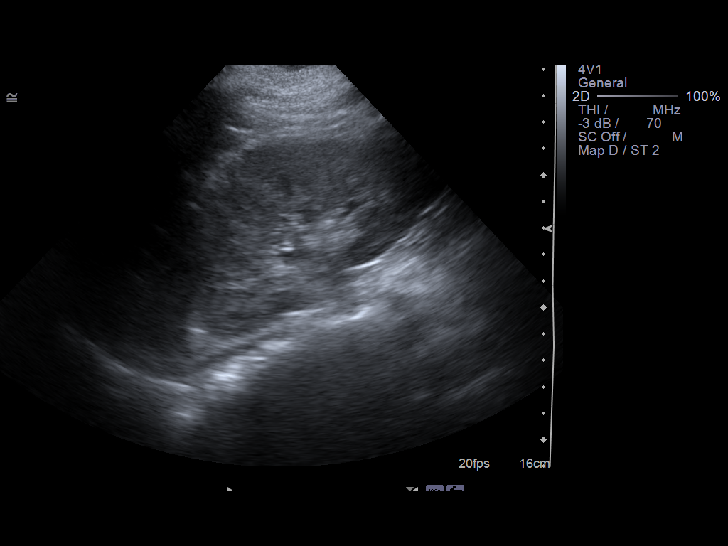
[im 42/42]
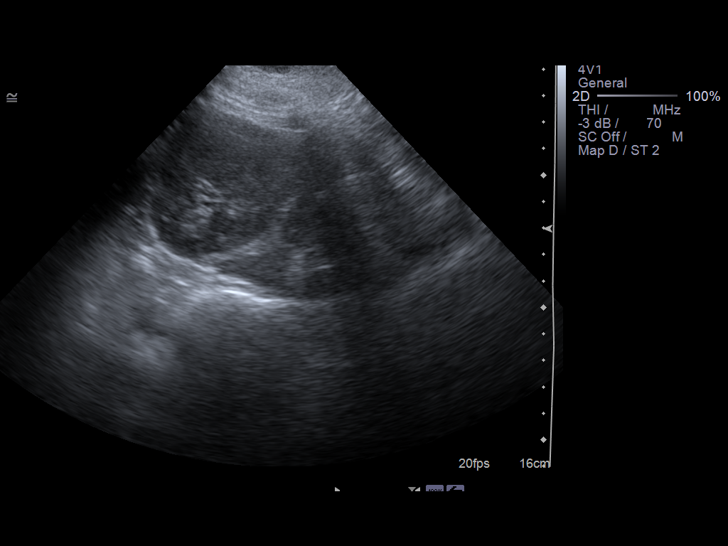

[14 of 25 positions shown; findings below may reference images not displayed]

FINDINGS: Gallbladder:  Status post cholecystectomy.

Common bile duct:  Within normal limits, measuring 3 mm.

Liver:  No focal lesion identified.  Within normal limits in
parenchymal echogenicity.

IVC:  Appears normal.

Pancreas:  Incompletely imaged, normal where seen.

Spleen:  Heterogeneous echogenicity however normal size, measuring
8.9 cm craniocaudal.

Right Kidney:  11.3 cm in length.  No hydronephrosis or focal
lesion.

Left Kidney:  12.2 cm in length.  No hydronephrosis or focal
lesion.

Abdominal aorta:  No aneurysm.  Measures up to 2.1 cm in diameter.
IMPRESSION: Heterogeneity of the spleen is nonspecific however may be related
to areas of infarct given the patient's history of sickle cell
crisis.  Otherwise, no sonographic findings to explain the
patient's abdominal pain.

Status post cholecystectomy.  No biliary duct dilatation.

## 2011-09-07 ENCOUNTER — Ambulatory Visit: Payer: Medicaid Other

## 2011-09-11 NOTE — ED Provider Notes (Signed)
History     CSN: [id number]  Arrival date & time 09/04/11  2059   None     Chief Complaint  Patient presents with  . Sickle Cell Pain Crisis    (Consider location/radiation/quality/duration/timing/severity/associated sxs/prior treatment) HPI Patient has right upper back pain, which is typical of her sickle cell crises.  This started this afternoon about 2:30 she unfortunately does not have any of her pain medication at home, which is oxycodone 10 mg, which she takes on a daily basis.  She is [facility].  She denies shortness of breath.  Injury, chest pain, diaphoresis.  She says she has frequent sickle cell crises last hospitalization was about one month ago.  There is also a concern that she may be pregnant.  Her last menstrual cycle was March 19.  She does not use any prophylactic protection to prevent pregnancy.  4:09 States starting to feel better would like to go home but is out of her routine home meds which include 10 mg Oxycodone QID Arman Filter, NP 09/04/11 2127  Arman Filter, NP 09/05/11 0414  Past Medical History  Diagnosis Date  . H/O: 1 miscarriage 03/22/2011  . 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 Active smoker 08/09/2010  . Depression 01/06/2011  . GERD (gastroesophageal reflux disease) 02/17/2011  . Trichotillomania     h/o  . Blood transfusion   . Sickle cell anemia with crisis     Past Surgical History  Procedure Date  . Cholecystectomy 05/2010  . Dilation and curettage of uterus 02/20/11    History reviewed. No pertinent family history.  History  Substance Use Topics  . Smoking status: Former Smoker    Quit date: 07/15/2011  . Smokeless tobacco: Never Used  . Alcohol Use: No    OB History    Grav Para Term Preterm Abortions TAB SAB Ect Mult Living   1    1           Review of Systems  Constitutional: Negative for fever and chills.  Respiratory: Negative for cough.   Cardiovascular: Negative for chest  pain.  Musculoskeletal: Positive for myalgias.  Neurological: Negative for weakness and headaches.    Allergies  Toradol  Home Medications   Current Outpatient Rx  Name Route Sig Dispense Refill  . VITAMIN D 1000 UNITS PO TABS Oral Take 1,000 Units by mouth daily.    Marland Kitchen FLUOXETINE HCL 20 MG PO TABS Oral Take 20 mg by mouth at bedtime.    Marland Kitchen FOLIC ACID 1 MG PO TABS Oral Take 1 mg by mouth daily.    Marland Kitchen HYDROXYUREA 500 MG PO CAPS Oral Take 1,000 mg by mouth daily. May take with food to minimize GI side effects.    . IBUPROFEN 200 MG PO TABS Oral Take 600 mg by mouth daily.     . OXYCODONE HCL 10 MG PO TABS Oral Take 10 mg by mouth 4 (four) times daily as needed. For pain.    Marland Kitchen PANTOPRAZOLE SODIUM 40 MG PO TBEC Oral Take 40 mg by mouth every morning.     Marland Kitchen PRENATAL RX 60-1 MG PO TABS Oral Take 1 tablet by mouth every morning.    . OXYCODONE HCL 5 MG PO TABS Oral Take 2 tablets (10 mg total) by mouth every 6 (six) hours as needed for pain. 20 tablet 0    BP 105/59  Pulse 89  Temp(Src) 98.5 F (36.9 C) (Oral)  Resp 18  SpO2 99%  LMP 08/04/2011  Physical Exam  Constitutional: She is oriented to person, place, and time. She appears well-developed and well-nourished.  HENT:  Head: Normocephalic.  Eyes: Pupils are equal, round, and reactive to light.  Neck: Normal range of motion.  Cardiovascular: Normal rate.   Pulmonary/Chest: Effort normal.  Abdominal: Soft.  Musculoskeletal: Normal range of motion.  Neurological: She is alert and oriented to person, place, and time.  Skin: Skin is warm. No rash noted. No pallor.    ED Course  Procedures (including critical care time)  Labs Reviewed  CBC - Abnormal; Notable for the following:    WBC 13.8 (*)    RBC 3.04 (*)    Hemoglobin 10.2 (*)    HCT 27.9 (*)    MCHC 36.6 (*)    Platelets 461 (*)    All other components within normal limits  DIFFERENTIAL - Abnormal; Notable for the following:    Neutro Abs 8.2 (*)    Monocytes  Absolute 1.1 (*)    All other components within normal limits  RETICULOCYTES - Abnormal; Notable for the following:    Retic Ct Pct 9.7 (*)    RBC. 3.04 (*)    Retic Count, Manual 294.9 (*)    All other components within normal limits  POCT I-STAT, CHEM 8 - Abnormal; Notable for the following:    Hemoglobin 11.2 (*)    HCT 33.0 (*)    All other components within normal limits  POCT PREGNANCY, URINE  LAB REPORT - SCANNED   No results found.   1. Sickle cell anemia with crisis       MDM  Sickle cell crisis with normal pain locations         Arman Filter, NP 09/11/11 1526

## 2011-09-13 NOTE — ED Provider Notes (Signed)
Medical screening examination/treatment/procedure(s) were performed by non-physician practitioner and as supervising physician I was immediately available for consultation/collaboration.   Carleene Cooper III, MD 09/13/11 1130

## 2011-10-03 ENCOUNTER — Encounter (HOSPITAL_COMMUNITY): Payer: Self-pay | Admitting: Emergency Medicine

## 2011-10-03 ENCOUNTER — Inpatient Hospital Stay (HOSPITAL_COMMUNITY)
Admission: EM | Admit: 2011-10-03 | Discharge: 2011-10-14 | DRG: 812 | Disposition: A | Discharge status: Home / Self Care | Payer: Medicaid Other | Source: Ambulatory Visit | Attending: Internal Medicine | Admitting: Internal Medicine

## 2011-10-03 DIAGNOSIS — F6389 Other impulse disorders: Secondary | ICD-10-CM | POA: Diagnosis present

## 2011-10-03 DIAGNOSIS — F633 Trichotillomania: Secondary | ICD-10-CM | POA: Diagnosis present

## 2011-10-03 DIAGNOSIS — M549 Dorsalgia, unspecified: Secondary | ICD-10-CM | POA: Diagnosis present

## 2011-10-03 DIAGNOSIS — R0789 Other chest pain: Secondary | ICD-10-CM | POA: Diagnosis present

## 2011-10-03 DIAGNOSIS — F17200 Nicotine dependence, unspecified, uncomplicated: Secondary | ICD-10-CM | POA: Diagnosis present

## 2011-10-03 DIAGNOSIS — F329 Major depressive disorder, single episode, unspecified: Secondary | ICD-10-CM | POA: Diagnosis present

## 2011-10-03 DIAGNOSIS — D5700 Hb-SS disease with crisis, unspecified: Secondary | ICD-10-CM

## 2011-10-03 DIAGNOSIS — E663 Overweight: Secondary | ICD-10-CM | POA: Diagnosis present

## 2011-10-03 DIAGNOSIS — F411 Generalized anxiety disorder: Secondary | ICD-10-CM | POA: Diagnosis present

## 2011-10-03 LAB — BASIC METABOLIC PANEL
BUN: 9 mg/dL (ref 6–23)
CO2: 24 mEq/L (ref 19–32)
Calcium: 8.9 mg/dL (ref 8.4–10.5)
Chloride: 104 mEq/L (ref 96–112)
Creatinine, Ser: 0.75 mg/dL (ref 0.50–1.10)
Glucose, Bld: 91 mg/dL (ref 70–99)

## 2011-10-03 LAB — CBC
HCT: 25.4 % — ABNORMAL LOW (ref 36.0–46.0)
Hemoglobin: 9.1 g/dL — ABNORMAL LOW (ref 12.0–15.0)
MCV: 89.4 fL (ref 78.0–100.0)
RDW: 15.3 % (ref 11.5–15.5)
WBC: 16 10*3/uL — ABNORMAL HIGH (ref 4.0–10.5)

## 2011-10-03 LAB — DIFFERENTIAL
Eosinophils Relative: 3 % (ref 0–5)
Lymphocytes Relative: 25 % (ref 12–46)
Monocytes Absolute: 1.3 10*3/uL — ABNORMAL HIGH (ref 0.1–1.0)
Monocytes Relative: 8 % (ref 3–12)
Neutro Abs: 10.1 10*3/uL — ABNORMAL HIGH (ref 1.7–7.7)

## 2011-10-03 MED ORDER — ONDANSETRON HCL 4 MG/2ML IJ SOLN
4.0000 mg | Freq: Once | INTRAMUSCULAR | Status: AC
  Administered 2011-10-04: 4 mg via INTRAVENOUS
  Filled 2011-10-03: qty 2

## 2011-10-03 MED ORDER — SODIUM CHLORIDE 0.9 % IV BOLUS (SEPSIS)
1000.0000 mL | Freq: Once | INTRAVENOUS | Status: AC
  Administered 2011-10-03: 1000 mL via INTRAVENOUS

## 2011-10-03 MED ORDER — PROMETHAZINE HCL 25 MG PO TABS
25.0000 mg | ORAL_TABLET | Freq: Once | ORAL | Status: AC
  Administered 2011-10-04: 25 mg via ORAL
  Filled 2011-10-03: qty 1

## 2011-10-03 MED ORDER — HYDROMORPHONE HCL PF 1 MG/ML IJ SOLN
1.0000 mg | Freq: Once | INTRAMUSCULAR | Status: AC
  Administered 2011-10-04: 1 mg via INTRAVENOUS
  Filled 2011-10-03: qty 1

## 2011-10-03 MED ORDER — DIPHENHYDRAMINE HCL 50 MG/ML IJ SOLN
25.0000 mg | Freq: Once | INTRAMUSCULAR | Status: AC
  Administered 2011-10-04: 25 mg via INTRAVENOUS
  Filled 2011-10-03: qty 1

## 2011-10-03 NOTE — ED Notes (Signed)
PT. REPORTS SICKLE CELL PAIN AT UPPER BACK ONSET YESTERDAY UNRELIEVED BY PRESCRIPTION OXYCODONE .

## 2011-10-04 ENCOUNTER — Encounter (HOSPITAL_COMMUNITY): Payer: Self-pay | Admitting: *Deleted

## 2011-10-04 DIAGNOSIS — E663 Overweight: Secondary | ICD-10-CM

## 2011-10-04 DIAGNOSIS — D5700 Hb-SS disease with crisis, unspecified: Secondary | ICD-10-CM

## 2011-10-04 DIAGNOSIS — K219 Gastro-esophageal reflux disease without esophagitis: Secondary | ICD-10-CM

## 2011-10-04 LAB — CBC
MCH: 32.2 pg (ref 26.0–34.0)
MCV: 88.9 fL (ref 78.0–100.0)
Platelets: 408 10*3/uL — ABNORMAL HIGH (ref 150–400)
RBC: 2.61 MIL/uL — ABNORMAL LOW (ref 3.87–5.11)

## 2011-10-04 LAB — URINALYSIS, ROUTINE W REFLEX MICROSCOPIC
Glucose, UA: NEGATIVE mg/dL
Hgb urine dipstick: NEGATIVE
Leukocytes, UA: NEGATIVE
Protein, ur: NEGATIVE mg/dL
Specific Gravity, Urine: 1.01 (ref 1.005–1.030)
pH: 6.5 (ref 5.0–8.0)

## 2011-10-04 MED ORDER — VITAMIN D3 25 MCG (1000 UNIT) PO TABS
1000.0000 [IU] | ORAL_TABLET | Freq: Every day | ORAL | Status: DC
  Administered 2011-10-04 – 2011-10-13 (×10): 1000 [IU] via ORAL
  Filled 2011-10-04 (×11): qty 1

## 2011-10-04 MED ORDER — HYDROXYUREA 500 MG PO CAPS
1000.0000 mg | ORAL_CAPSULE | Freq: Every day | ORAL | Status: DC
  Administered 2011-10-04 – 2011-10-08 (×4): 1000 mg via ORAL
  Filled 2011-10-04 (×13): qty 2

## 2011-10-04 MED ORDER — ENOXAPARIN SODIUM 40 MG/0.4ML ~~LOC~~ SOLN
40.0000 mg | SUBCUTANEOUS | Status: DC
  Administered 2011-10-06 – 2011-10-10 (×3): 40 mg via SUBCUTANEOUS
  Filled 2011-10-04 (×10): qty 0.4

## 2011-10-04 MED ORDER — ONDANSETRON HCL 4 MG/2ML IJ SOLN
4.0000 mg | Freq: Four times a day (QID) | INTRAMUSCULAR | Status: DC | PRN
  Administered 2011-10-06: 4 mg via INTRAVENOUS
  Filled 2011-10-04: qty 2

## 2011-10-04 MED ORDER — TRAMADOL HCL 50 MG PO TABS
50.0000 mg | ORAL_TABLET | Freq: Four times a day (QID) | ORAL | Status: DC
  Administered 2011-10-04 – 2011-10-06 (×9): 50 mg via ORAL
  Filled 2011-10-04 (×13): qty 1

## 2011-10-04 MED ORDER — HEPARIN SODIUM (PORCINE) 5000 UNIT/ML IJ SOLN
5000.0000 [IU] | Freq: Three times a day (TID) | INTRAMUSCULAR | Status: DC
  Filled 2011-10-04 (×4): qty 1

## 2011-10-04 MED ORDER — HYDROMORPHONE HCL PF 1 MG/ML IJ SOLN
1.0000 mg | INTRAMUSCULAR | Status: DC | PRN
  Administered 2011-10-04 – 2011-10-05 (×11): 1 mg via INTRAVENOUS
  Filled 2011-10-04 (×11): qty 1

## 2011-10-04 MED ORDER — HYDROMORPHONE HCL PF 1 MG/ML IJ SOLN
1.0000 mg | Freq: Once | INTRAMUSCULAR | Status: AC
  Administered 2011-10-04: 1 mg via INTRAVENOUS
  Filled 2011-10-04: qty 1

## 2011-10-04 MED ORDER — FOLIC ACID 1 MG PO TABS
1.0000 mg | ORAL_TABLET | Freq: Every day | ORAL | Status: DC
  Administered 2011-10-04 – 2011-10-13 (×10): 1 mg via ORAL
  Filled 2011-10-04 (×11): qty 1

## 2011-10-04 MED ORDER — SENNA 8.6 MG PO TABS
1.0000 | ORAL_TABLET | Freq: Two times a day (BID) | ORAL | Status: DC
  Administered 2011-10-04 – 2011-10-13 (×20): 8.6 mg via ORAL
  Filled 2011-10-04 (×16): qty 1
  Filled 2011-10-04: qty 2
  Filled 2011-10-04 (×3): qty 1

## 2011-10-04 MED ORDER — ACETAMINOPHEN 325 MG PO TABS: 650.0000 mg | ORAL_TABLET | ORAL | Status: DC | PRN

## 2011-10-04 MED ORDER — PROMETHAZINE HCL 12.5 MG PO TABS
12.5000 mg | ORAL_TABLET | ORAL | Status: DC | PRN
  Administered 2011-10-04: 12.5 mg via ORAL
  Filled 2011-10-04 (×2): qty 2

## 2011-10-04 MED ORDER — IBUPROFEN 800 MG PO TABS
800.0000 mg | ORAL_TABLET | Freq: Three times a day (TID) | ORAL | Status: DC
  Administered 2011-10-04 – 2011-10-11 (×11): 800 mg via ORAL
  Filled 2011-10-04 (×33): qty 1

## 2011-10-04 MED ORDER — DIPHENHYDRAMINE HCL 50 MG/ML IJ SOLN: 12.5000 mg | INTRAMUSCULAR | Status: DC | PRN

## 2011-10-04 MED ORDER — ACETAMINOPHEN 650 MG RE SUPP: 650.0000 mg | RECTAL | Status: DC | PRN

## 2011-10-04 MED ORDER — SODIUM CHLORIDE 0.45 % IV SOLN
INTRAVENOUS | Status: DC
  Administered 2011-10-04: 14:00:00 via INTRAVENOUS

## 2011-10-04 MED ORDER — DIPHENHYDRAMINE HCL 25 MG PO CAPS: 25.0000 mg | ORAL_CAPSULE | ORAL | Status: DC | PRN

## 2011-10-04 MED ORDER — HEPARIN SODIUM (PORCINE) 5000 UNIT/ML IJ SOLN
5000.0000 [IU] | Freq: Three times a day (TID) | INTRAMUSCULAR | Status: DC
  Administered 2011-10-04: 5000 [IU] via SUBCUTANEOUS
  Filled 2011-10-04 (×3): qty 1

## 2011-10-04 MED ORDER — IBUPROFEN 800 MG PO TABS: 800.0000 mg | ORAL_TABLET | Freq: Three times a day (TID) | ORAL | Status: DC | PRN

## 2011-10-04 MED ORDER — FLUOXETINE HCL 20 MG PO TABS
20.0000 mg | ORAL_TABLET | Freq: Every day | ORAL | Status: DC
  Administered 2011-10-04 – 2011-10-13 (×10): 20 mg via ORAL
  Filled 2011-10-04 (×12): qty 1

## 2011-10-04 MED ORDER — PANTOPRAZOLE SODIUM 40 MG PO TBEC
40.0000 mg | DELAYED_RELEASE_TABLET | Freq: Every day | ORAL | Status: DC
  Administered 2011-10-04 – 2011-10-13 (×10): 40 mg via ORAL
  Filled 2011-10-04 (×11): qty 1

## 2011-10-04 MED ORDER — PROMETHAZINE HCL 25 MG RE SUPP: 12.5000 mg | RECTAL | Status: DC | PRN

## 2011-10-04 MED ORDER — NALOXONE HCL 0.4 MG/ML IJ SOLN: 0.4000 mg | INTRAMUSCULAR | Status: DC | PRN

## 2011-10-04 MED ORDER — SODIUM CHLORIDE 0.9 % IJ SOLN: 9.0000 mL | INTRAMUSCULAR | Status: DC | PRN

## 2011-10-04 NOTE — Progress Notes (Signed)
UR COMPLETED  

## 2011-10-04 NOTE — Care Management Note (Signed)
    Page 1 of 1   10/04/2011     2:09:51 PM   CARE MANAGEMENT NOTE 10/04/2011  Patient:  [name], [name]   Account Number:  [id number]  Date Initiated:  10/04/2011  Documentation initiated by:  Fransico Michael  Subjective/Objective Assessment:   admitted on 10/03/11 with c/o sickle cell crisis     Action/Plan:   IV dilaudid  IVFs   Anticipated DC Date:  10/07/2011   Anticipated DC Plan:  HOME/SELF CARE      DC Planning Services  CM consult      Choice offered to / List presented to:             Status of service:  In process, will continue to follow Medicare Important Message given?   (If response is "NO", the following Medicare IM given date fields will be blank) Date Medicare IM given:   Date Additional Medicare IM given:    Discharge Disposition:    Per UR Regulation:  Reviewed for med. necessity/level of care/duration of stay  If discussed at Long Length of Stay Meetings, dates discussed:    Comments:  PCP: Freeman CaldronDonia Guiles (mother) [phone]  10/04/11-1303-J.Lutricia Horsfall [phone]      20yo female admitted on 10/03/11 with c/o back pain. Noted to be in sickle cell crisis. Prior to admission, patient lived at home with significant other. No home needs identified at this time. CM will continue to monitor for discharge/home needs.

## 2011-10-04 NOTE — H&P (Signed)
Family Medicine Teaching [facility] Admission History and Physical  Patient name: [name] Medical record number: [id number] Date of birth: 1990/09/13 Age: 21 y.o. Gender: female  Primary Care Provider: [name], MD, MD  Chief Complaint: Back pain  History of Present Illness: [name] is a 21 y.o. year old female presenting with 2 day h/o back pain that has progressivley worsened. Initially controlled w/ Oxycodone and ibuprofen. Pain is worse in her L upper back region w/ areas of point tenderness in the L upper perispinal region. Mild chest discomfort that has been present for weeks and occurs w/ deep inspiration adn is associated w/ a popping sensation. Also reports some nausea, diarrhea x1 and emesis x1 yesterday.   Review Of Systems: Per HPI with the following additions:  Negative fever, rash, constipation, lightheadedness, syncope, palpitations  Patient Active Problem List  Diagnoses  . SICKLE CELL ANEMIA  . TRICHOTILLOMANIA  . Active smoker  . Back pain  . Depression  . GERD (gastroesophageal reflux disease)  . Contraception management  . Stress  . Overweight   Past Medical History: Past Medical History  Diagnosis Date  . H/O: 1 miscarriage 03/22/2011  . 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 Active smoker 08/09/2010  . Depression 01/06/2011  . GERD (gastroesophageal reflux disease) 02/17/2011  . Trichotillomania     h/o  . Blood transfusion   . Sickle cell anemia with crisis     Past Surgical History: Past Surgical History  Procedure Date  . Cholecystectomy 05/2010  . Dilation and curettage of uterus 02/20/11    Social History: History   Social History  . Marital Status: Single    Spouse Name: N/A    Number of Children: N/A  . Years of Education: N/A   Social History Main Topics  . Smoking status: Former Smoker    Quit date: 07/15/2011  . Smokeless tobacco: Never Used  . Alcohol Use: No  . Drug Use: No     marijuana  . Sexually  Active: None   Other Topics Concern  . None   Social History Narrative  . None    Family History: No family history on file.  Allergies: Allergies  Allergen Reactions  . Ketorolac Tromethamine Hives and Itching    Current Facility-Administered Medications  Medication Dose Route Frequency Provider Last Rate Last Dose  . diphenhydrAMINE (BENADRYL) injection 25 mg  25 mg Intravenous Once Sunnie Nielsen, MD   25 mg at 10/04/11 0003  . HYDROmorphone (DILAUDID) injection 1 mg  1 mg Intravenous Once Sunnie Nielsen, MD   1 mg at 10/04/11 0003  . HYDROmorphone (DILAUDID) injection 1 mg  1 mg Intravenous Once Sunnie Nielsen, MD   1 mg at 10/04/11 0104  . HYDROmorphone (DILAUDID) injection 1 mg  1 mg Intravenous Once Sunnie Nielsen, MD   1 mg at 10/04/11 0311  . ondansetron (ZOFRAN) injection 4 mg  4 mg Intravenous Once Sunnie Nielsen, MD   4 mg at 10/04/11 0003  . promethazine (PHENERGAN) tablet 25 mg  25 mg Oral Once Sunnie Nielsen, MD   25 mg at 10/04/11 0003  . sodium chloride 0.9 % bolus 1,000 mL  1,000 mL Intravenous Once Sunnie Nielsen, MD   1,000 mL at 10/03/11 2340   Current Outpatient Prescriptions  Medication Sig Dispense Refill  . cholecalciferol (VITAMIN D) 1000 UNITS tablet Take 1,000 Units by mouth daily.      Marland Kitchen FLUoxetine (PROZAC) 20 MG tablet Take 20 mg by mouth at bedtime.      Marland Kitchen  hydroxyurea (HYDREA) 500 MG capsule Take 1,000 mg by mouth daily. May take with food to minimize GI side effects.      Marland Kitchen ibuprofen (ADVIL,MOTRIN) 200 MG tablet Take 600 mg by mouth daily.       . Oxycodone HCl 10 MG TABS Take 10 mg by mouth 4 (four) times daily as needed. For pain.      . pantoprazole (PROTONIX) 40 MG tablet Take 40 mg by mouth every morning.       . Prenatal Vit-Fe Fumarate-FA (PRENATAL MULTIVITAMIN) 60-1 MG tablet Take 1 tablet by mouth every morning.      Marland Kitchen DISCONTD: medroxyPROGESTERone (DEPO-PROVERA) 150 MG/ML injection Inject 150 mg into the muscle every 3 (three) months. Last dose 04/20/11          Physical Exam: Filed Vitals:   10/04/11 0303  BP: 115/95  Pulse: 107  Temp:   Resp: 17   General:WNWD,  HEENT: MMM Heart: RRR, No m/r/g Lungs: CTAB, normal effort Abdomen: non-painful to palpation Extremities: 2+ pulses, no edema Musculoskeletal: Normal ROM.  Patient has TTP of left>right shoulder as well as over sternum.  Skin: no rashes  Labs and Imaging: Results for orders placed during the hospital encounter of 10/03/11 (from the past 24 hour(s))  CBC     Status: Abnormal   Collection Time   10/03/11  9:58 PM      Component Value Range   WBC 16.0 (*) 4.0 - 10.5 (K/uL)   RBC 2.84 (*) 3.87 - 5.11 (MIL/uL)   Hemoglobin 9.1 (*) 12.0 - 15.0 (g/dL)   HCT 95.6 (*) 21.3 - 46.0 (%)   MCV 89.4  78.0 - 100.0 (fL)   MCH 32.0  26.0 - 34.0 (pg)   MCHC 35.8  30.0 - 36.0 (g/dL)   RDW 08.6  57.8 - 46.9 (%)   Platelets 463 (*) 150 - 400 (K/uL)  DIFFERENTIAL     Status: Abnormal   Collection Time   10/03/11  9:58 PM      Component Value Range   Neutrophils Relative 63  43 - 77 (%)   Neutro Abs 10.1 (*) 1.7 - 7.7 (K/uL)   Lymphocytes Relative 25  12 - 46 (%)   Lymphs Abs 4.0  0.7 - 4.0 (K/uL)   Monocytes Relative 8  3 - 12 (%)   Monocytes Absolute 1.3 (*) 0.1 - 1.0 (K/uL)   Eosinophils Relative 3  0 - 5 (%)   Eosinophils Absolute 0.5  0.0 - 0.7 (K/uL)   Basophils Relative 0  0 - 1 (%)   Basophils Absolute 0.1  0.0 - 0.1 (K/uL)  BASIC METABOLIC PANEL     Status: Normal   Collection Time   10/03/11  9:58 PM      Component Value Range   Sodium 138  135 - 145 (mEq/L)   Potassium 3.5  3.5 - 5.1 (mEq/L)   Chloride 104  96 - 112 (mEq/L)   CO2 24  19 - 32 (mEq/L)   Glucose, Bld 91  70 - 99 (mg/dL)   BUN 9  6 - 23 (mg/dL)   Creatinine, Ser 6.29  0.50 - 1.10 (mg/dL)   Calcium 8.9  8.4 - 52.8 (mg/dL)   GFR calc non Af Amer >90  >90 (mL/min)   GFR calc Af Amer >90  >90 (mL/min)  RETICULOCYTES     Status: Abnormal   Collection Time   10/03/11  9:58 PM      Component Value Range  Retic Ct Pct 14.7 (*) 0.4 - 3.1 (%)   RBC. 2.84 (*) 3.87 - 5.11 (MIL/uL)   Retic Count, Manual 417.5 (*) 19.0 - 186.0 (K/uL)  URINALYSIS, ROUTINE W REFLEX MICROSCOPIC     Status: Normal   Collection Time   10/04/11  1:30 AM      Component Value Range   Color, Urine YELLOW  YELLOW    APPearance CLEAR  CLEAR    Specific Gravity, Urine 1.010  1.005 - 1.030    pH 6.5  5.0 - 8.0    Glucose, UA NEGATIVE  NEGATIVE (mg/dL)   Hgb urine dipstick NEGATIVE  NEGATIVE    Bilirubin Urine NEGATIVE  NEGATIVE    Ketones, ur NEGATIVE  NEGATIVE (mg/dL)   Protein, ur NEGATIVE  NEGATIVE (mg/dL)   Urobilinogen, UA 1.0  0.0 - 1.0 (mg/dL)   Nitrite NEGATIVE  NEGATIVE    Leukocytes, UA NEGATIVE  NEGATIVE   PREGNANCY, URINE     Status: Normal   Collection Time   10/04/11  1:30 AM      Component Value Range   Preg Test, Ur NEGATIVE  NEGATIVE    Assessment and Plan: [name] is a 21 y.o. year old female with Hgb SS, presenting with pain crisis.  1. Sickle Cell Pain Crisis: Pt is well known to family practice and is presenting w/ her normal pain crisis. No signs of acute chest. Pt does not want to go on PCA. Retic count is excellent. Hgb near baseline w/o orthostatic complaints.  - IV dilaudid 1mg  Q2 - Tylenol, ibuprofen - IVF 160ml/hr - Benadryl, Zofran - Continue home hydroxyuria  2. CP: likely multifactorial, musculoskeletal and sickle cell pain crisis . Not likely to be cardiac due to nml EKG adn description of how pain only on deep inspiration associated w/ popping feeling and has been ongoing for weeks.   3. FEN/GI: Diarrhea and emesis x1. No sick contacts.  - Regular diet - IVF as above - Senokot   3. Prophylaxis:  - Hep Sub Q TID  4. Disposition: Pending improvement.  Signed: Shelly Flatten, M.D. Family Medicine Resident PGY-1 [phone] 10/04/2011 3:40 AM  PGY2 Addendum to PGY1 Note:  I saw and evaluated this patient with Dr. Konrad Dolores and participated in the above history,  physical exam, and assessment and plan.  I agree with Dr. Satira Sark findings.  We will admit [name] to the [facility] and per her request, will try to avoid the dilaudid PCA if possible.   Fremon Zacharia 10/04/2011 4:19 AM

## 2011-10-04 NOTE — ED Provider Notes (Signed)
History     CSN: [id number]  Arrival date & time 10/03/11  2046   First MD Initiated Contact with Patient 10/03/11 2127      Chief Complaint  Patient presents with  . Sickle Cell Pain Crisis    (Consider location/radiation/quality/duration/timing/severity/associated sxs/prior treatment) HPI History provided by patient. Has history of sickle cell anemia and taking medications at home as prescribed. Despite taking her oxycodone today she developed mid back pain that feels like her typical sickle cell pains. Just has some elbow and knee pain. No rashes. No swelling. No chest pain or shortness of breath. No fevers or chills. Pain is sharp in quality and not radiating. No alleviating factors. No fall or trauma. Past Medical History  Diagnosis Date  . H/O: 1 miscarriage 03/22/2011  . 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 Active smoker 08/09/2010  . Depression 01/06/2011  . GERD (gastroesophageal reflux disease) 02/17/2011  . Trichotillomania     h/o  . Blood transfusion   . Sickle cell anemia with crisis     Past Surgical History  Procedure Date  . Cholecystectomy 05/2010  . Dilation and curettage of uterus 02/20/11    No family history on file.  History  Substance Use Topics  . Smoking status: Former Smoker    Quit date: 07/15/2011  . Smokeless tobacco: Never Used  . Alcohol Use: No    OB History    Grav Para Term Preterm Abortions TAB SAB Ect Mult Living   1    1           Review of Systems  Constitutional: Negative for fever and chills.  HENT: Negative for neck pain and neck stiffness.   Eyes: Negative for pain.  Respiratory: Negative for shortness of breath.   Cardiovascular: Negative for chest pain and palpitations.  Gastrointestinal: Negative for abdominal pain.  Genitourinary: Negative for dysuria.  Musculoskeletal: Positive for back pain.  Skin: Negative for rash.  Neurological: Negative for headaches.  All other systems reviewed and are negative.    Allergies    Ketorolac tromethamine  Home Medications   Current Outpatient Rx  Name Route Sig Dispense Refill  . VITAMIN D 1000 UNITS PO TABS Oral Take 1,000 Units by mouth daily.    Marland Kitchen FLUOXETINE HCL 20 MG PO TABS Oral Take 20 mg by mouth at bedtime.    Marland Kitchen HYDROXYUREA 500 MG PO CAPS Oral Take 1,000 mg by mouth daily. May take with food to minimize GI side effects.    . IBUPROFEN 200 MG PO TABS Oral Take 600 mg by mouth daily.     . OXYCODONE HCL 10 MG PO TABS Oral Take 10 mg by mouth 4 (four) times daily as needed. For pain.    Marland Kitchen PANTOPRAZOLE SODIUM 40 MG PO TBEC Oral Take 40 mg by mouth every morning.     Marland Kitchen PRENATAL RX 60-1 MG PO TABS Oral Take 1 tablet by mouth every morning.      BP 115/95  Pulse 107  Temp(Src) 98.7 F (37.1 C) (Oral)  Resp 17  SpO2 99%  LMP 09/17/2011  Physical Exam  Constitutional: She is oriented to person, place, and time. She appears well-developed and well-nourished.  HENT:  Head: Normocephalic and atraumatic.  Eyes: Conjunctivae and EOM are normal. Pupils are equal, round, and reactive to light.  Neck: Trachea normal. Neck supple. No thyromegaly present.  Cardiovascular: Normal rate, regular rhythm, S1 normal, S2 normal and normal pulses.     No systolic murmur is present  No diastolic murmur is present  Pulses:      Radial pulses are 2+ on the right side, and 2+ on the left side.  Pulmonary/Chest: Effort normal and breath sounds normal. She has no wheezes. She has no rhonchi. She has no rales. She exhibits no tenderness.  Abdominal: Soft. Normal appearance and bowel sounds are normal. There is no tenderness. There is no CVA tenderness and negative Murphy's sign.  Musculoskeletal:       Localizes discomfort to right parathoracic region with reproducible tenderness. No crepitus or rash. No midline tenderness or deformity.   Moves all extremities x4 without bony deformity swelling or erythema.   BLE:s Calves nontender, no cords or erythema, negative Homans sign   Neurological: She is alert and oriented to person, place, and time. She has normal strength. No cranial nerve deficit or sensory deficit. GCS eye subscore is 4. GCS verbal subscore is 5. GCS motor subscore is 6.  Skin: Skin is warm and dry. No rash noted. She is not diaphoretic.  Psychiatric: Her speech is normal.       Cooperative and appropriate    ED Course  Procedures (including critical care time)  Labs Reviewed  CBC - Abnormal; Notable for the following:    WBC 16.0 (*)    RBC 2.84 (*)    Hemoglobin 9.1 (*)    HCT 25.4 (*)    Platelets 463 (*)    All other components within normal limits  DIFFERENTIAL - Abnormal; Notable for the following:    Neutro Abs 10.1 (*)    Monocytes Absolute 1.3 (*)    All other components within normal limits  RETICULOCYTES - Abnormal; Notable for the following:    Retic Ct Pct 14.7 (*)    RBC. 2.84 (*)    Retic Count, Manual 417.5 (*)    All other components within normal limits  BASIC METABOLIC PANEL  URINALYSIS, ROUTINE W REFLEX MICROSCOPIC  PREGNANCY, URINE     1. Sickle cell crisis     IV Dilaudid and IV fluids. Oxygen provided. Multiple rounds of narcotic pain medications without relief of symptoms.  MDM   Sickle cell pain crisis. Labs obtained and reviewed as above. Medicine consult for admission. Case discussed as above with family practice resident who agrees to admit        Sunnie Nielsen, MD 10/04/11 [phone]

## 2011-10-04 NOTE — H&P (Signed)
Family Medicine Teaching Service Attending Note  I interviewed and examined patient Common and reviewed their tests and x-rays.  I discussed with Dr. Lula Olszewski and reviewed their note for today.  I agree with their assessment and plan.     Additionally  Feeling some better with back and hip pain Eating well No shortness of breath No pain with range of motion of hips bilaterally Treat for SSC - monitor respiratory and hgb

## 2011-10-04 NOTE — Progress Notes (Signed)
Daily Progress Note [name]. [name], M.D., M.B.A  Family Medicine PGY-1 Pager [phone]  Subjective:  Patient sitting on the edge of the bed without any clothes on and partially covered by a sheet when I entered. She stated that she was naked, because it was hot it the room. I turned the thermostat down and she covered with a gown. She was also complaining that the food services got her breakfast order wrong, and she was very frustrated about this.   Pain - 8/10, mainly in the back, do difficulty breathing    Objective: Vital signs in last 24 hours: Temp:  [98.1 F (36.7 C)-99.3 F (37.4 C)] 98.1 F (36.7 C) (05/21 0417) Pulse Rate:  [87-112] 94  (05/21 0417) Resp:  [16-19] 18  (05/21 0417) BP: (109-118)/(57-95) 109/57 mmHg (05/21 0417) SpO2:  [98 %-100 %] 98 % (05/21 0417) Weight:  [192 lb 8 oz (87.317 kg)] 192 lb 8 oz (87.317 kg) (05/21 0417) Weight change:  Last BM Date: 10/03/11  Intake/Output from previous day:   Intake/Output this shift:   General: young AAF female, overweight body habitus, not in a good mood  HEENT: MMM  Heart: RRR, No m/r/g  Lungs: CTAB, normal effort  Abdomen: non-painful to palpation  Extremities: 2+ pulses, no edema  Musculoskeletal: Normal ROM. Patient has TTP of left>right shoulder as well as over sternum.  Skin: no rashes    Lab Results:  [facility] 10/04/11 0610 10/03/11 2158  WBC 16.7* 16.0*  HGB 8.4* 9.1*  HCT 23.2* 25.4*  PLT 408* 463*   BMET  Basename 10/04/11 0610 10/03/11 2158  NA -- 138  K -- 3.5  CL -- 104  CO2 -- 24  GLUCOSE -- 91  BUN -- 9  CREATININE 0.62 0.75  CALCIUM -- 8.9    Studies/Results: No results found.  Medications: I have reviewed the patient's current medications.  Assessment/Plan: [name] is a 21 y.o. year old female with Hgb SS, presenting with pain crisis.   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  No signs of acute chest. Pt does not want to go on PCA. Retic count is excellent. Hgb near baseline w/o orthostatic complaints.  -  Pain poorly controlled this morning so schedule ibuprofen and tramadol; continue IV dilaudid 1mg  Q2  - IVF 178ml/hr  - Benadryl, Zofran  - Continue home hydroxyuria   # CP: likely multifactorial, musculoskeletal and sickle cell pain crisis . Not likely to be cardiac due to nml EKG adn description of how pain only on deep inspiration associated w/ popping feeling and has been ongoing for weeks.  # Psych: continue home fluoxetine    # FEN/GI: Diarrhea and emesis x1. No sick contacts.  - Regular diet  - IVF as above  - Senokot bowel regimen   # Prophylaxis:  - Hep Sub Q TID for today, changed to lovenox for tomorrow    LOS: 1 day   Mat Carne 10/04/2011, 9:43 AM

## 2011-10-05 MED ORDER — OXYCODONE HCL 5 MG PO TABS
10.0000 mg | ORAL_TABLET | Freq: Four times a day (QID) | ORAL | Status: DC
  Administered 2011-10-05 – 2011-10-06 (×5): 10 mg via ORAL
  Filled 2011-10-05 (×5): qty 2

## 2011-10-05 MED ORDER — OXYCODONE HCL 10 MG PO TABS: 10.0000 mg | ORAL_TABLET | Freq: Four times a day (QID) | ORAL | Status: DC

## 2011-10-05 MED ORDER — HYDROMORPHONE HCL PF 1 MG/ML IJ SOLN
1.0000 mg | INTRAMUSCULAR | Status: DC | PRN
  Administered 2011-10-05 – 2011-10-06 (×7): 1 mg via INTRAVENOUS
  Filled 2011-10-05 (×7): qty 1

## 2011-10-05 NOTE — Progress Notes (Signed)
Daily Progress Note [name]. [name], M.D., M.B.A  Family Medicine PGY-1 Pager [phone]  Subjective:  Pain - mildly improved, asked for PRN dilaudid x 10 in 24 hours, would like not to take PCA or oral morphine 2/2 itching, denies any difficulty breathing, no facial droop or new onset weakness  Birth Control - Is worried that she is pregnant, was reassured that test negative, is planning to go to health dept on May 30 for contraception    Objective: Vital signs in last 24 hours: Temp:  [98 F (36.7 C)-98.2 F (36.8 C)] 98.1 F (36.7 C) (05/22 0508) Pulse Rate:  [87-98] 87  (05/22 0508) Resp:  [18-20] 20  (05/22 0508) BP: (104-107)/(50-61) 107/50 mmHg (05/22 0508) SpO2:  [97 %-100 %] 100 % (05/22 0508) Weight change:  Last BM Date: 10/03/11  Intake/Output from previous day: 05/21 0701 - 05/22 0700 In: 3975 [P.O.:1100; I.V.:2875] Out: -  Intake/Output this shift:   General: young AAF female, overweight body habitus, moderate discomfort, very pleasant  HEENT: NCAT, PERRLA, EMOI, OP clear and moist  Heart: RRR, No m/r/g  Lungs: CTAB, normal effort  Abdomen: non-painful to palpation  Extremities: 2+ pulses, no edema  Musculoskeletal: Normal ROM. Patient has TTP of left>right shoulder as well as over sternum. TTP along spine.  Skin: no rashes    Lab Results:  [facility] 10/04/11 0610 10/03/11 2158  WBC 16.7* 16.0*  HGB 8.4* 9.1*  HCT 23.2* 25.4*  PLT 408* 463*   BMET  Basename 10/04/11 0610 10/03/11 2158  NA -- 138  K -- 3.5  CL -- 104  CO2 -- 24  GLUCOSE -- 91  BUN -- 9  CREATININE 0.62 0.75  CALCIUM -- 8.9    Studies/Results: No results found.  Medications: I have reviewed the patient's current medications.  Assessment/Plan: [name] is a 21 y.o. year old female with Hgb SS, presenting with pain crisis.   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 No signs of  acute chest. Pt does not want to go on PCA. Retic count is excellent. Hgb near baseline w/o orthostatic complaints.  - start oxycodone 10 mg q 4 scheduled; continue schedule ibuprofen and tramadol; continue IV dilaudid 1mg  Q2  - IVF 19ml/hr  - Benadryl, Zofran  - Continue home hydroxyuria   # CP: likely multifactorial, musculoskeletal and sickle cell pain crisis . Not likely to be cardiac due to nml EKG adn description of how pain only on deep inspiration associated w/ popping feeling and has been ongoing for weeks.  # Psych: continue home fluoxetine    # FEN/GI: Diarrhea and emesis x1. No sick contacts.  - Regular diet  - IVF as above  - Senokot bowel regimen   # Prophylaxis:  - lovenox     LOS: 2 days   [name] 10/05/2011, 8:41 AM

## 2011-10-05 NOTE — Progress Notes (Signed)
Family Medicine Teaching Service Attending Note  I discussed patient [place]  with Dr. Williamson and reviewed their note for today.  I agree with their assessment and plan.      

## 2011-10-06 ENCOUNTER — Inpatient Hospital Stay (HOSPITAL_COMMUNITY): Payer: Medicaid Other

## 2011-10-06 LAB — CBC
HCT: 22.7 % — ABNORMAL LOW (ref 36.0–46.0)
MCV: 89.4 fL (ref 78.0–100.0)
RBC: 2.54 MIL/uL — ABNORMAL LOW (ref 3.87–5.11)
RDW: 16.5 % — ABNORMAL HIGH (ref 11.5–15.5)
WBC: 14.8 10*3/uL — ABNORMAL HIGH (ref 4.0–10.5)

## 2011-10-06 MED ORDER — TRAMADOL HCL 50 MG PO TABS
100.0000 mg | ORAL_TABLET | Freq: Four times a day (QID) | ORAL | Status: DC
  Administered 2011-10-06 – 2011-10-07 (×4): 100 mg via ORAL
  Filled 2011-10-06 (×5): qty 2

## 2011-10-06 MED ORDER — OXYCODONE HCL 5 MG PO TABS
15.0000 mg | ORAL_TABLET | Freq: Four times a day (QID) | ORAL | Status: DC
  Administered 2011-10-06 – 2011-10-14 (×24): 15 mg via ORAL
  Filled 2011-10-06 (×3): qty 3
  Filled 2011-10-06: qty 1
  Filled 2011-10-06: qty 3
  Filled 2011-10-06: qty 2
  Filled 2011-10-06: qty 3
  Filled 2011-10-06: qty 15
  Filled 2011-10-06 (×6): qty 3
  Filled 2011-10-06: qty 1
  Filled 2011-10-06 (×13): qty 3

## 2011-10-06 MED ORDER — ACETAMINOPHEN 650 MG RE SUPP
650.0000 mg | Freq: Four times a day (QID) | RECTAL | Status: DC
  Filled 2011-10-06 (×32): qty 1

## 2011-10-06 MED ORDER — ZOLPIDEM TARTRATE 5 MG PO TABS
5.0000 mg | ORAL_TABLET | Freq: Every day | ORAL | Status: DC
  Administered 2011-10-06: 5 mg via ORAL
  Filled 2011-10-06: qty 1

## 2011-10-06 MED ORDER — HYDROMORPHONE HCL PF 1 MG/ML IJ SOLN
1.5000 mg | INTRAMUSCULAR | Status: DC | PRN
  Administered 2011-10-06 – 2011-10-07 (×6): 1.5 mg via INTRAVENOUS
  Filled 2011-10-06 (×6): qty 2

## 2011-10-06 MED ORDER — ACETAMINOPHEN 325 MG PO TABS
650.0000 mg | ORAL_TABLET | Freq: Four times a day (QID) | ORAL | Status: DC
  Administered 2011-10-06 – 2011-10-14 (×26): 650 mg via ORAL
  Filled 2011-10-06 (×37): qty 2

## 2011-10-06 NOTE — Progress Notes (Signed)
Family Medicine Teaching Service Brief Progress Note: Jaeleah had episode of acute shortness of breath that woke her up from sleep and scared her. She complained of pain in her chest with associated back pain typical for her pain crisis. 2L [place] was placed and O2 sats were at 98% on 2L. Patient appeared very anxious and had difficulty calming down. Off of oxygen, she sated at 100%. Anterior Chest exam: bibasilar crackles.  Filed Vitals:   10/06/11 2106  BP: 122/76  Pulse: 100  Temp: 99.1 F (37.3 C)  Resp: 20  A/P: 21 yo female with sickle cell disease who presented for pain crisis.  will get chest xray to make sure this is not acute chest given her complaint of chest pain, although lack of hypoxia and tachypnea make it less likely.  Also gave her dilaudid for pain control.   Marena Chancy, PGY-1 Family Medicine Teaching Service  [phone]

## 2011-10-06 NOTE — Progress Notes (Signed)
Daily Progress Note [name]. [name], M.D., M.B.A  Family Medicine PGY-1 Pager [phone]  Subjective:  Patient is talking very loudly and vivaciously on the phone and can be heard through a through a closed door in the hallway. When I walk in she is lying in bed without any clothes on, but thankfully covered by a sheet as usual.   Pain - moderate in her lower back, is controlled with dilaudid so she would like it to be more frequent; when I suggested that we increase her level of oxycodone, and she says it makes her nauseous, but as we are speaking, she is preparing to eat a plate of eggs   Disposition - thinks she will be ready to go home tomorrow if she can get her back pain better and get a better night's sleep; I suggested that she may be able to sleep better at home, but she is not prepared to leave from a pain stand point  Objective: Vital signs in last 24 hours: Temp:  [97.7 F (36.5 C)-99 F (37.2 C)] 97.7 F (36.5 C) (05/23 0552) Pulse Rate:  [85-99] 85  (05/23 0552) Resp:  [18-20] 18  (05/23 0552) BP: (110-139)/(50-91) 110/50 mmHg (05/23 0552) SpO2:  [98 %-99 %] 98 % (05/23 0552) Weight change:  Last BM Date: 10/05/11  Intake/Output from previous day: 05/22 0701 - 05/23 0700 In: 120 [P.O.:120] Out: 258 [Urine:258] Intake/Output this shift:   General: young AAF female, overweight body habitus, moderate discomfort, very pleasant  HEENT: NCAT, PERRLA, EMOI, OP clear and moist  Heart: RRR, No m/r/g  Lungs: CTAB, normal effort  Abdomen: non-painful to palpation  Extremities: 2+ pulses, no edema  Musculoskeletal: Normal ROM. Patient has TTP of left>right shoulder as well as over sternum. TTP along spine.  Skin: no rashes    Lab Results:  Basename 10/06/11 0640 10/04/11 0610  WBC 14.8* 16.7*  HGB 8.2* 8.4*  HCT 22.7* 23.2*  PLT 416* 408*   BMET  Basename 10/04/11 0610 10/03/11 2158  NA -- 138  K -- 3.5  CL -- 104  CO2 -- 24  GLUCOSE -- 91  BUN -- 9    CREATININE 0.62 0.75  CALCIUM -- 8.9    Studies/Results: No results found.  Medications: I have reviewed the patient's current medications.  Assessment/Plan: [name] is a 21 y.o. year old female with Hgb SS, presenting with pain crisis.   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 No signs of acute chest. Pt does not want to go on PCA. Retic count is excellent. Hgb near baseline w/o orthostatic complaints.  - increase oxycodone 15 mg q 4 scheduled; increase tramadol to 100 mg q 4; continue ibuprofen scheduled; continue IV dilaudid 1mg  Q3 - IVF 170ml/hr  - Benadryl, Zofran  - Continue home hydroxyuria   # CP: resolved  # Psych: continue home fluoxetine    # FEN/GI: Diarrhea and emesis x1. No sick contacts.  - Regular diet  - IVF as above  - Senokot bowel regimen   # Prophylaxis:  - lovenox  - start ambien for sleep    LOS: 3 days   Mat Carne 10/06/2011, 1:41 PM

## 2011-10-06 NOTE — Progress Notes (Signed)
Family Medicine Teaching Service Attending Note  I interviewed and examined patient [name] and reviewed their tests and x-rays.  I discussed with Dr. Clinton Sawyer and reviewed their note for today.  I agree with their assessment and plan.     Additionally  Having intermittent back pain.  No shortness of breath or nausea or vomiting Wean analgesics as tolerated

## 2011-10-07 LAB — CBC
HCT: 22.2 % — ABNORMAL LOW (ref 36.0–46.0)
Hemoglobin: 7.9 g/dL — ABNORMAL LOW (ref 12.0–15.0)
MCH: 32.2 pg (ref 26.0–34.0)
MCHC: 35.6 g/dL (ref 30.0–36.0)
MCV: 90.6 fL (ref 78.0–100.0)

## 2011-10-07 MED ORDER — HYDROMORPHONE HCL PF 1 MG/ML IJ SOLN: 1.0000 mg | INTRAMUSCULAR | Status: DC | PRN

## 2011-10-07 MED ORDER — HYDROMORPHONE HCL PF 1 MG/ML IJ SOLN
1.5000 mg | INTRAMUSCULAR | Status: DC | PRN
  Administered 2011-10-07 – 2011-10-08 (×3): 1.5 mg via INTRAVENOUS
  Filled 2011-10-07 (×3): qty 2

## 2011-10-07 NOTE — Progress Notes (Signed)
Family Medicine Teaching Service Attending Note  I discussed patient [place]  with Dr. Clinton Sawyer and reviewed their note for today.  I agree with their assessment and plan.

## 2011-10-07 NOTE — Progress Notes (Signed)
Daily Progress Note [name]. [name], M.D., M.B.A  Family Medicine PGY-1 Pager [phone]  Subjective:  Yesterday PM Event - The patient states that's she called Dr. Willey [name], director of the sickle cell center, and was told to stop Tylenol and Hydoxyurea until a follow-up appointment. She did not know why. I called Dr. August [name] this morning and spoke to home directly. He says that he did not speak to the patient yesterday, but believes that [name] called the clinic and told a nurse that she was pregnant. I assured him that she was not pregnant according to our urine pregnancy test administered upon admission. Otherwise, he did not have any insight into her comment.   Overnight had an event of acute shortness of breath and chest pain. She was very histrionic and stated that she "coudn't breath." It occurred suddenly and resolved with dialudid. A chest X-ray was negative for acute infiltrate and no O2 was required. The patient went back to sleep and slept well throughout the night.   Pain - Improved in her back, now present in the sternum  Disposition - patient concerned about going home today b/c of new sternum pain   Objective: Vital signs in last 24 hours: Temp:  [98.9 F (37.2 C)-99.1 F (37.3 C)] 98.9 F (37.2 C) (05/24 0500) Pulse Rate:  [99-116] 99  (05/24 0500) Resp:  [18-20] 20  (05/24 0500) BP: (101-122)/(64-76) 101/64 mmHg (05/24 0500) SpO2:  [95 %-99 %] 95 % (05/24 0500) Weight change:  Last BM Date: 10/05/11  Intake/Output from previous day: 05/23 0701 - 05/24 0700 In: 120 [P.O.:120] Out: -  Intake/Output this shift:   General: young AAF female, overweight body habitus, moderate discomfort, strange cadence of speech this morning that seems analogous to attention seeking behavior b/c it was more noticeable when her boyfriend asked her about it  HEENT: NCAT, PERRLA, EMOI, OP clear and moist  Heart: RRR, No m/r/g  Chest: TTP along sternum  Lungs: CTAB, normal effort   Abdomen: non-painful to palpation  Extremities: 2+ pulses, no edema  Musculoskeletal: Normal ROM. Patient has TTP of left>right shoulder as well as over sternum. TTP along spine.  Skin: no rashes    Lab Results:  Basename 10/07/11 0620 10/06/11 0640  WBC 14.6* 14.8*  HGB 7.9* 8.2*  HCT 22.2* 22.7*  PLT 426* 416*   BMET No results found for this basename: NA:2,K:2,CL:2,CO2:2,GLUCOSE:2,BUN:2,CREATININE:2,CALCIUM:2 in the last 72 hours  Studies/Results: Dg Chest 2 View  10/06/2011  *RADIOLOGY REPORT*  Clinical Data: Of breath.  History of sickle cell disease.  CHEST - 2 VIEW  Comparison: Two-view chest x-ray 07/14/2011, 06/09/2011, 05/19/2011, 10/20/2010.  Findings: Cardiomediastinal silhouette unremarkable, unchanged. Linear scarring in the right upper lobe, unchanged.  No new pulmonary parenchymal abnormalities.  No pleural effusions. 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  No significant interval change.  IMPRESSION: No acute cardiopulmonary disease.  Linear scarring in the right upper lobe.  Stable examination.  Original Report Authenticated By: Arnell Sieving, M.D.    Medications: I have reviewed the patient's current medications.  Assessment/Plan: [name] is a 21 y.o. year old female with Hgb SS, presenting with pain crisis.   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 No signs of acute chest. Pt does not want to go on PCA. Retic count is excellent. Hgb near baseline w/o orthostatic complaints.  - continur oxycodone 15 mg q 4  scheduled, ibuprofen q 6, and tylenol q 6 scheduled - stop tramadol and dilaudid to better reflect home environment and simplify pain regimen - IVF 126ml/hr  - Benadryl, Zofran  - Continue home hydroxyuria   # CP: 2/2 anxiety and possible sickle pain in sternum  # Psych: continue home fluoxetine for anxiety and depression  - behavioral  component with anxiety, attention seeking, and fixation upon being pregnant is making her sickle disease more difficult to care for   # FEN/GI: Diarrhea and emesis x1. No sick contacts.  - Regular diet  - IVF as above  - Senokot bowel regimen   # Prophylaxis:  - lovenox  - stop ambien for sleep as it may have worsened anxiety, since it was taken 30 minutes prior to anxiety attack  # Dispo - probable d/c tomorrow   LOS: 4 days   [name] 10/07/2011, 9:37 AM

## 2011-10-07 NOTE — Progress Notes (Signed)
Utilization review completed.  

## 2011-10-08 DIAGNOSIS — D5700 Hb-SS disease with crisis, unspecified: Secondary | ICD-10-CM | POA: Diagnosis present

## 2011-10-08 LAB — CBC
MCH: 32.4 pg (ref 26.0–34.0)
MCV: 93.1 fL (ref 78.0–100.0)
Platelets: 369 10*3/uL (ref 150–400)
RDW: 18.9 % — ABNORMAL HIGH (ref 11.5–15.5)

## 2011-10-08 MED ORDER — HYDROMORPHONE HCL PF 1 MG/ML IJ SOLN
1.0000 mg | INTRAMUSCULAR | Status: DC | PRN
  Administered 2011-10-08 – 2011-10-09 (×7): 1 mg via INTRAVENOUS
  Filled 2011-10-08 (×7): qty 1

## 2011-10-08 NOTE — Progress Notes (Signed)
Family Medicine Teaching Service Attending Note  I interviewed and examined patient [name] and reviewed their tests and x-rays.  I discussed with Dr. Clinton Sawyer and reviewed their note for today.  I agree with their assessment and plan.     Additionally  When I saw her before Dr Clinton Sawyer she was complaining of back pain moderate without shortness of breath or chest pain Spoke with Dr Madolyn Frieze who had seen [name] after Dr Clinton Sawyer and issue seem to be calm. Will continue to treat for [facility] and monitor closely
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
## 2011-10-09 LAB — CBC
MCHC: 34.8 g/dL (ref 30.0–36.0)
Platelets: 463 10*3/uL — ABNORMAL HIGH (ref 150–400)
RDW: 19.8 % — ABNORMAL HIGH (ref 11.5–15.5)
WBC: 13.4 10*3/uL — ABNORMAL HIGH (ref 4.0–10.5)

## 2011-10-09 LAB — COMPREHENSIVE METABOLIC PANEL
AST: 38 U/L — ABNORMAL HIGH (ref 0–37)
Albumin: 3.7 g/dL (ref 3.5–5.2)
Alkaline Phosphatase: 57 U/L (ref 39–117)
BUN: 4 mg/dL — ABNORMAL LOW (ref 6–23)
Chloride: 98 mEq/L (ref 96–112)
Potassium: 3.7 mEq/L (ref 3.5–5.1)
Sodium: 132 mEq/L — ABNORMAL LOW (ref 135–145)
Total Bilirubin: 1.8 mg/dL — ABNORMAL HIGH (ref 0.3–1.2)
Total Protein: 7 g/dL (ref 6.0–8.3)

## 2011-10-09 LAB — RETICULOCYTES
RBC.: 2.57 MIL/uL — ABNORMAL LOW (ref 3.87–5.11)
Retic Ct Pct: 27.2 % — ABNORMAL HIGH (ref 0.4–3.1)

## 2011-10-09 MED ORDER — POTASSIUM CHLORIDE IN NACL 20-0.45 MEQ/L-% IV SOLN
INTRAVENOUS | Status: DC
  Administered 2011-10-09 – 2011-10-10 (×2): via INTRAVENOUS
  Administered 2011-10-10: 1000 mL via INTRAVENOUS
  Administered 2011-10-11 – 2011-10-13 (×4): via INTRAVENOUS
  Filled 2011-10-09 (×10): qty 1000

## 2011-10-09 MED ORDER — CYCLOBENZAPRINE HCL 5 MG PO TABS
5.0000 mg | ORAL_TABLET | Freq: Three times a day (TID) | ORAL | Status: DC
  Administered 2011-10-09 – 2011-10-13 (×14): 5 mg via ORAL
  Filled 2011-10-09 (×17): qty 1

## 2011-10-09 MED ORDER — HYDROMORPHONE HCL PF 1 MG/ML IJ SOLN
1.0000 mg | INTRAMUSCULAR | Status: DC | PRN
  Administered 2011-10-09 – 2011-10-10 (×9): 2 mg via INTRAVENOUS
  Administered 2011-10-10: 1 mg via INTRAVENOUS
  Administered 2011-10-10 – 2011-10-12 (×7): 2 mg via INTRAVENOUS
  Filled 2011-10-09 (×16): qty 2

## 2011-10-09 NOTE — Progress Notes (Signed)
Subjective:  Patient reports she rested well last night. She notes the pain was 10 out of 10 when she came in. This has decreased to a 6/10 today. She describes pain mainly in the upper back and left shoulder region. She gives a history of feeling a pop in her back 2 days ago while bending over in a restaurant. She denies chest pains or shortness of breath. No lower extremity weakness.   Allergies  Allergen Reactions  . Ketorolac Tromethamine Hives and Itching   Current Facility-Administered Medications  Medication Dose Route Frequency Provider Last Rate Last Dose  . 0.45 % NaCl with KCl 20 mEq / L infusion   Intravenous Continuous Gwenyth Bender, MD      . acetaminophen (TYLENOL) tablet 650 mg  650 mg Oral Q6H Garnetta Buddy, MD   650 mg at 10/09/11 4540   Or  . acetaminophen (TYLENOL) suppository 650 mg  650 mg Rectal Q6H Garnetta Buddy, MD      . cholecalciferol (VITAMIN D) tablet 1,000 Units  1,000 Units Oral Daily Ozella Rocks, MD   1,000 Units at 10/08/11 [phone]  . diphenhydrAMINE (BENADRYL) capsule 25-50 mg  25-50 mg Oral Q4H PRN Ozella Rocks, MD       Or  . diphenhydrAMINE (BENADRYL) injection 12.5-25 mg  12.5-25 mg Intravenous Q4H PRN Ozella Rocks, MD      . enoxaparin (LOVENOX) injection 40 mg  40 mg Subcutaneous Q24H Garnetta Buddy, MD   40 mg at 10/08/11 1218  . FLUoxetine (PROZAC) tablet 20 mg  20 mg Oral QHS Ozella Rocks, MD   20 mg at 10/08/11 2325  . folic acid (FOLVITE) tablet 1 mg  1 mg Oral Daily Ozella Rocks, MD   1 mg at 10/08/11 0943  . HYDROmorphone (DILAUDID) injection 1 mg  1 mg Intravenous Q2H PRN Garnetta Buddy, MD   1 mg at 10/09/11 1151  . hydroxyurea (HYDREA) capsule 1,000 mg  1,000 mg Oral Q breakfast Ozella Rocks, MD   1,000 mg at 10/08/11 0800  . ibuprofen (ADVIL,MOTRIN) tablet 800 mg  800 mg Oral Q8H Garnetta Buddy, MD   800 mg at 10/06/11 1421  . naloxone [facility]) injection 0.4 mg  0.4 mg Intravenous PRN Ozella Rocks, MD       And  . sodium chloride 0.9 % injection 9 mL  9 mL Intravenous PRN Ozella Rocks, MD      . ondansetron [facility]) injection 4 mg  4 mg Intravenous Q6H PRN Ozella Rocks, MD   4 mg at 10/06/11 1246  . oxyCODONE (Oxy IR/ROXICODONE) immediate release tablet 15 mg  15 mg Oral Q6H Garnetta Buddy, MD   15 mg at 10/09/11 0240  . pantoprazole (PROTONIX) EC tablet 40 mg  40 mg Oral Q1200 Ozella Rocks, MD   40 mg at 10/08/11 1218  . promethazine (PHENERGAN) tablet 12.5-25 mg  12.5-25 mg Oral Q4H PRN Ozella Rocks, MD   12.5 mg at 10/04/11 1955   Or  . promethazine (PHENERGAN) suppository 12.5-25 mg  12.5-25 mg Rectal Q4H PRN Ozella Rocks, MD      . senna [facility]) tablet 8.6 mg  1 tablet Oral BID Ozella Rocks, MD   8.6 mg at 10/08/11 2325    Objective: Blood pressure 101/64, pulse 88, temperature 98.8 F (37.1 C), temperature source Oral, resp. rate 18, height 5\' 3"  (1.6 m), weight 182 lb  1.6 oz (82.6 kg), last menstrual period 09/17/2011, SpO2 94.00%.  Well-developed black female presently in no acute distress HEENT:. No sinus tenderness. No sclera icterus. NECK: No enlarged thyroid. Small right posterior cervical nodes. Slightly tender. LUNGS: Clear to auscultation. No vocal fremitus. CV: Normal S1, S2 without S3. ABD: No epigastric tenderness. MSK: Tenderness in the upper back and left trapezoid muscle spasm. Tenderness in the midthoracic area to palpation. NEURO: Nonfocal.  Lab results: Results for orders placed during the hospital encounter of 10/03/11 (from the past 48 hour(s))  CBC     Status: Abnormal   Collection Time   10/08/11  8:30 AM      Component Value Range Comment   WBC 13.6 (*) 4.0 - 10.5 (K/uL) WHITE COUNT CONFIRMED ON SMEAR   RBC 2.47 (*) 3.87 - 5.11 (MIL/uL)    Hemoglobin 8.0 (*) 12.0 - 15.0 (g/dL)    HCT 40.9 (*) 81.1 - 46.0 (%)    MCV 93.1  78.0 - 100.0 (fL)    MCH 32.4  26.0 - 34.0 (pg)    MCHC 34.8  30.0 - 36.0 (g/dL)    RDW 91.4  (*) 78.2 - 15.5 (%)    Platelets 369  150 - 400 (K/uL) PLATELET CLUMPS NOTED ON SMEAR, COUNT APPEARS ADEQUATE    Studies/Results: No results found.  Patient Active Problem List  Diagnoses  . SICKLE CELL ANEMIA  . TRICHOTILLOMANIA  . Active smoker  . Back pain  . Depression  . GERD (gastroesophageal reflux disease)  . Contraception management  . Stress  . Overweight  . Vaso-occlusive sickle cell crisis    Impression: Sickle cell crisis. Musculoskeletal pain with associated spasms. Tobacco abuse history. Leukocytosis secondary to #1. Rule out occult infection. Her urinalysis was unremarkable. Chest x-ray was clear. Can't exclude occult sinus disease.   Plan: Continue present therapy. IV fluids for hydration. Muscle relaxant upper muscle skeletal spasms. Z-Pak empiric way. Followup CBC, CMET, reticulocyte count. Smoking cessation education.   August Saucer, Leianne Callins 10/09/2011 12:52 PM

## 2011-10-10 LAB — CBC
Hemoglobin: 7.8 g/dL — ABNORMAL LOW (ref 12.0–15.0)
MCH: 32.6 pg (ref 26.0–34.0)
MCV: 95 fL (ref 78.0–100.0)
Platelets: 331 10*3/uL (ref 150–400)
RBC: 2.39 MIL/uL — ABNORMAL LOW (ref 3.87–5.11)
WBC: 13.4 10*3/uL — ABNORMAL HIGH (ref 4.0–10.5)

## 2011-10-10 MED ORDER — DOXYCYCLINE HYCLATE 100 MG PO TABS
100.0000 mg | ORAL_TABLET | Freq: Two times a day (BID) | ORAL | Status: DC
  Administered 2011-10-10 – 2011-10-13 (×7): 100 mg via ORAL
  Filled 2011-10-10 (×9): qty 1

## 2011-10-10 MED ORDER — MUPIROCIN 2 % EX OINT
1.0000 "application " | TOPICAL_OINTMENT | Freq: Two times a day (BID) | CUTANEOUS | Status: DC
  Administered 2011-10-10 – 2011-10-13 (×5): 1 via NASAL
  Filled 2011-10-10: qty 22

## 2011-10-10 NOTE — Progress Notes (Signed)
Subjective:  Patient slowly feeling better. She rates her overall pain as a 6/10. She's having pain in her upper back with a solid nodular area in the back as well. She denies chest pains otherwise. No shortness of breath. She's looking forward to getting home soon.   Allergies  Allergen Reactions  . Ketorolac Tromethamine Hives and Itching   Current Facility-Administered Medications  Medication Dose Route Frequency Provider Last Rate Last Dose  . 0.45 % NaCl with KCl 20 mEq / L infusion   Intravenous Continuous Gwenyth Bender, MD 75 mL/hr at 10/10/11 1632 1,000 mL at 10/10/11 1632  . acetaminophen (TYLENOL) tablet 650 mg  650 mg Oral Q6H Garnetta Buddy, MD   650 mg at 10/10/11 1624   Or  . acetaminophen (TYLENOL) suppository 650 mg  650 mg Rectal Q6H Garnetta Buddy, MD      . cholecalciferol (VITAMIN D) tablet 1,000 Units  1,000 Units Oral Daily Ozella Rocks, MD   1,000 Units at 10/10/11 [phone]  . cyclobenzaprine (FLEXERIL) tablet 5 mg  5 mg Oral TID Gwenyth Bender, MD   5 mg at 10/10/11 1624  . diphenhydrAMINE (BENADRYL) capsule 25-50 mg  25-50 mg Oral Q4H PRN Ozella Rocks, MD       Or  . diphenhydrAMINE (BENADRYL) injection 12.5-25 mg  12.5-25 mg Intravenous Q4H PRN Ozella Rocks, MD      . enoxaparin (LOVENOX) injection 40 mg  40 mg Subcutaneous Q24H Garnetta Buddy, MD   40 mg at 10/10/11 1214  . FLUoxetine (PROZAC) tablet 20 mg  20 mg Oral QHS Ozella Rocks, MD   20 mg at 10/09/11 2146  . folic acid (FOLVITE) tablet 1 mg  1 mg Oral Daily Ozella Rocks, MD   1 mg at 10/10/11 0948  . HYDROmorphone (DILAUDID) injection 1-2 mg  1-2 mg Intravenous Q2H PRN Gwenyth Bender, MD   2 mg at 10/10/11 1415  . hydroxyurea (HYDREA) capsule 1,000 mg  1,000 mg Oral Q breakfast Ozella Rocks, MD   1,000 mg at 10/08/11 0800  . ibuprofen (ADVIL,MOTRIN) tablet 800 mg  800 mg Oral Q8H Garnetta Buddy, MD   800 mg at 10/09/11 2146  . naloxone [facility]) injection 0.4 mg  0.4 mg Intravenous  PRN Ozella Rocks, MD       And  . sodium chloride 0.9 % injection 9 mL  9 mL Intravenous PRN Ozella Rocks, MD      . ondansetron [facility]) injection 4 mg  4 mg Intravenous Q6H PRN Ozella Rocks, MD   4 mg at 10/06/11 1246  . oxyCODONE (Oxy IR/ROXICODONE) immediate release tablet 15 mg  15 mg Oral Q6H Garnetta Buddy, MD   15 mg at 10/10/11 1500  . pantoprazole (PROTONIX) EC tablet 40 mg  40 mg Oral Q1200 Ozella Rocks, MD   40 mg at 10/10/11 1215  . promethazine (PHENERGAN) tablet 12.5-25 mg  12.5-25 mg Oral Q4H PRN Ozella Rocks, MD   12.5 mg at 10/04/11 1955   Or  . promethazine (PHENERGAN) suppository 12.5-25 mg  12.5-25 mg Rectal Q4H PRN Ozella Rocks, MD      . senna [facility]) tablet 8.6 mg  1 tablet Oral BID Ozella Rocks, MD   8.6 mg at 10/10/11 2956    Objective: Blood pressure 111/55, pulse 85, temperature 98.9 F (37.2 C), temperature source Oral, resp. rate 18, height 5\' 3"  (1.6 m),  weight 184 lb 4.9 oz (83.6 kg), last menstrual period 09/17/2011, SpO2 95.00%.  Well-developed well-nourished black female in no acute distress. HEENT: No sinus tenderness. Minimal sclera icterus. NECK: No enlarged thyroid. No posterior cervical nodes. LUNGS: Clear to auscultation. No vocal fremitus. No CVA tenderness. CV: Normal S1, S2 without S3. ABD: Dullness to percussion. No masses tenderness. MSK: Tenderness in the upper thoracic region. Tenderness in the left trapezoid muscle region. NEURO: Intact. SKIN: 1.5 cm area of induration in the upper portion of the back. Shallow area of excoriation versus a loss of epidermis. No purulence noted at this time. Area is tender to palpation.  Lab results: Results for orders placed during the hospital encounter of 10/03/11 (from the past 48 hour(s))  CBC     Status: Abnormal   Collection Time   10/09/11  1:40 PM      Component Value Range Comment   WBC 13.4 (*) 4.0 - 10.5 (K/uL)    RBC 2.57 (*) 3.87 - 5.11 (MIL/uL)    Hemoglobin 8.5  (*) 12.0 - 15.0 (g/dL)    HCT 16.1 (*) 09.6 - 46.0 (%)    MCV 94.9  78.0 - 100.0 (fL)    MCH 33.1  26.0 - 34.0 (pg)    MCHC 34.8  30.0 - 36.0 (g/dL)    RDW 04.5 (*) 40.9 - 15.5 (%)    Platelets 463 (*) 150 - 400 (K/uL)   COMPREHENSIVE METABOLIC PANEL     Status: Abnormal   Collection Time   10/09/11  1:40 PM      Component Value Range Comment   Sodium 132 (*) 135 - 145 (mEq/L)    Potassium 3.7  3.5 - 5.1 (mEq/L)    Chloride 98  96 - 112 (mEq/L)    CO2 26  19 - 32 (mEq/L)    Glucose, Bld 112 (*) 70 - 99 (mg/dL)    BUN 4 (*) 6 - 23 (mg/dL)    Creatinine, Ser 8.11  0.50 - 1.10 (mg/dL)    Calcium 9.4  8.4 - 10.5 (mg/dL)    Total Protein 7.0  6.0 - 8.3 (g/dL)    Albumin 3.7  3.5 - 5.2 (g/dL)    AST 38 (*) 0 - 37 (U/L)    ALT 51 (*) 0 - 35 (U/L)    Alkaline Phosphatase 57  39 - 117 (U/L)    Total Bilirubin 1.8 (*) 0.3 - 1.2 (mg/dL)    GFR calc non Af Amer >90  >90 (mL/min)    GFR calc Af Amer >90  >90 (mL/min)   RETICULOCYTES     Status: Abnormal   Collection Time   10/09/11  1:40 PM      Component Value Range Comment   Retic Ct Pct 27.2 (*) 0.4 - 3.1 (%) RESULTS CONFIRMED BY MANUAL DILUTION   RBC. 2.57 (*) 3.87 - 5.11 (MIL/uL)    Retic Count, Manual 699.0 (*) 19.0 - 186.0 (K/uL)   CBC     Status: Abnormal   Collection Time   10/10/11  3:54 AM      Component Value Range Comment   WBC 13.4 (*) 4.0 - 10.5 (K/uL)    RBC 2.39 (*) 3.87 - 5.11 (MIL/uL)    Hemoglobin 7.8 (*) 12.0 - 15.0 (g/dL)    HCT 91.4 (*) 78.2 - 46.0 (%)    MCV 95.0  78.0 - 100.0 (fL)    MCH 32.6  26.0 - 34.0 (pg)    MCHC 34.4  30.0 -  36.0 (g/dL)    RDW 27.2 (*) 53.6 - 15.5 (%)    Platelets 331  150 - 400 (K/uL)     Studies/Results: No results found.  Patient Active Problem List  Diagnoses  . SICKLE CELL ANEMIA  . TRICHOTILLOMANIA  . Active smoker  . Back pain  . Depression  . GERD (gastroesophageal reflux disease)  . Contraception management  . Stress  . Overweight  . Vaso-occlusive sickle cell  crisis    Impression: Sickle cell crisis. Tender nodular lesion on the back. Rule out early abscess. Leukocytosis secondary to #1 and 2. Tobacco abuse. Trichotillomania.   Plan: Continue present therapy. Change to doxycycline 100 mg twice a day. Culture site of drainage if noted. Followup CBC, CMET in a.m. Home soon otherwise if continues to improve.   [name] 10/10/2011 6:00 PM

## 2011-10-10 NOTE — Progress Notes (Signed)
Pt. Laying in bed all day sleeping, refuses to sit up in a chair. Boyfriend also in with pt. Sleeping on couch. Appetite good. Encouraged to drink fluids, preferrably water. Complaining of pain in her lower back rating it 5-7 and it usually it doesn't come down after being medicated.

## 2011-10-11 ENCOUNTER — Inpatient Hospital Stay (HOSPITAL_COMMUNITY): Payer: Medicaid Other

## 2011-10-11 LAB — PREGNANCY, URINE: Preg Test, Ur: NEGATIVE

## 2011-10-11 LAB — CBC
HCT: 24.8 % — ABNORMAL LOW (ref 36.0–46.0)
Hemoglobin: 8.4 g/dL — ABNORMAL LOW (ref 12.0–15.0)
MCHC: 35.3 g/dL (ref 30.0–36.0)
MCV: 97.3 fL (ref 78.0–100.0)
Platelets: 430 10*3/uL — ABNORMAL HIGH (ref 150–400)
RDW: 18.5 % — ABNORMAL HIGH (ref 11.5–15.5)
WBC: 13.7 10*3/uL — ABNORMAL HIGH (ref 4.0–10.5)
WBC: 13.4 10*3/uL — ABNORMAL HIGH (ref 4.0–10.5)

## 2011-10-11 LAB — COMPREHENSIVE METABOLIC PANEL
Alkaline Phosphatase: 59 U/L (ref 39–117)
BUN: 13 mg/dL (ref 6–23)
Chloride: 105 mEq/L (ref 96–112)
Creatinine, Ser: 0.7 mg/dL (ref 0.50–1.10)
GFR calc Af Amer: >90 mL/min (ref >90)
GFR calc non Af Amer: >90 mL/min (ref >90)
Glucose, Bld: 92 mg/dL (ref 70–99)
Potassium: 4.2 mEq/L (ref 3.5–5.1)
Total Bilirubin: 1.5 mg/dL — ABNORMAL HIGH (ref 0.3–1.2)

## 2011-10-11 MED ORDER — SODIUM CHLORIDE 0.9 % IJ SOLN: 10.0000 mL | INTRAMUSCULAR | Status: DC | PRN

## 2011-10-11 MED ORDER — DIPHENHYDRAMINE HCL 50 MG/ML IJ SOLN: 25.0000 mg | Freq: Once | INTRAMUSCULAR | Status: DC

## 2011-10-11 MED ORDER — HYDROMORPHONE HCL PF 2 MG/ML IJ SOLN
2.0000 mg | INTRAMUSCULAR | Status: DC | PRN
  Administered 2011-10-11 (×2): 2 mg via INTRAMUSCULAR
  Filled 2011-10-11 (×5): qty 1

## 2011-10-11 MED ORDER — SODIUM CHLORIDE 0.9 % IJ SOLN
10.0000 mL | Freq: Two times a day (BID) | INTRAMUSCULAR | Status: DC
  Administered 2011-10-11: 40 mL

## 2011-10-11 NOTE — Progress Notes (Signed)
PROGRESS NOTE  [name] JXB:[id number] DOB: 1990/10/30 DOA: 10/03/2011 PCP: [name], MD, MD  Brief narrative: [name] is a 21 y.o. year old female presenting with  h/o back pain that has progressivley worsened. Initially controlled w/ Oxycodone and ibuprofen. Pain  Became worse in her L upper back region w/ areas of point tenderness in the L upper perispinal region. Mild chest discomfort occurs w/ deep inspiration. Assessment/Plan: Principal Problem:  *Vaso-occlusive sickle cell crisis Active Problems:  SICKLE CELL ANEMIA  TRICHOTILLOMANIA  Depression   Code Status: Full Family Communication: None Disposition Plan:  Discharge to home when Stable   Medical Consultants:  None  Other consultants:    Antibiotics:  Doxycycline bid   Subjective  [name] states still not feeling well able to move a little better and using IS but back is really causing a lot of discomfort.   Objective    Interim History: Stable over night   Objective: Filed Vitals:   10/10/11 2148 10/11/11 0207 10/11/11 0603 10/11/11 1000  BP: 110/71 101/63 97/61 128/81  Pulse: 99 96 88 100  Temp: 99.2 F (37.3 C) 98.7 F (37.1 C) 98.3 F (36.8 C) 98.4 F (36.9 C)  TempSrc: Oral Oral Oral Oral  Resp: 18 18 18 19   Height:      Weight:   84.687 kg (186 lb 11.2 oz)   SpO2: 100% 94% 96% 97%    Intake/Output Summary (Last 24 hours) at 10/11/11 1125 Last data filed at 10/11/11 1000  Gross per 24 hour  Intake   1675 ml  Output      0 ml  Net   1675 ml    Exam:  General Appearance: Alert and oriented, cooperative, well nourished, well developed, mild distress  Head: Normocephalic, atraumatic  Eyes: PERRLA, EOMI, scleral icterus  Nose: Nares, septum and mucosa are normal, no visible drainage, no sinus tenderness  Throat: Lips, mucosa, tongue, teeth and gums are normal  Neck: Trachea is midline, no lymphophadenopathy, mass or thyromegaly  Back: Symmetric, no curvature,  bilateral CVA tenderness, diffuse tenderness  Resp: Diminished breath sounds bibasilar and bilaterally, CTA, no wheezes/rales/rhonchi  Cardio: Regular rate and rhythm, S1, S2 normal, soft murmur, no click/rub/gallop  GI: Soft, distended, hypoactive bowel sounds, no organomegaly  Extremities: Atraumatic, no cyanosis or edema, Homans sign is negative, no sign of DVT  Pulses: 2+ and symmetric  Skin: Skin color, texture and turgor are normal, Neurologic: Grossly normal, AO x 3, CN II - XII intact, no focal deficits  Psych: Flat affect     Data Reviewed: Basic Metabolic Panel:  Lab 10/11/11 1308 10/09/11 1340  NA 138 132*  K 4.2 3.7  CL 105 98  CO2 21 26  GLUCOSE 92 112*  BUN 13 4*  CREATININE 0.70 0.73  CALCIUM 8.8 9.4  MG -- --  PHOS -- --   GFR Estimated Creatinine Clearance: 116.6 ml/min (by C-G formula based on Cr of 0.7). Liver Function Tests:  Lab 10/11/11 0400 10/09/11 1340  AST 37 38*  ALT 46* 51*  ALKPHOS 59 57  BILITOT 1.5* 1.8*  PROT 6.9 7.0  ALBUMIN 3.6 3.7   No results found for this basename: LIPASE:5,AMYLASE:5 in the last 168 hours No results found for this basename: AMMONIA:5 in the last 168 hours Coagulation profile No results found for this basename: INR:5,PROTIME:5 in the last 168 hours  CBC:  Lab 10/11/11 0400 10/10/11 0354 10/09/11 1340 10/08/11 0830 10/07/11 0620  WBC 13.7* 13.4* 13.4* 13.6*  14.6*  NEUTROABS -- -- -- -- --  HGB 8.4* 7.8* 8.5* 8.0* 7.9*  HCT 24.8* 22.7* 24.4* 23.0* 22.2*  MCV 97.3 95.0 94.9 93.1 90.6  PLT 382 331 463* 369 426*   Cardiac Enzymes: No results found for this basename: CKTOTAL:5,CKMB:5,CKMBINDEX:5,TROPONINI:5 in the last 168 hours BNP: No components found with this basename: POCBNP:5 CBG: No results found for this basename: GLUCAP:5 in the last 168 hours D-Dimer No results found for this basename: DDIMER:2 in the last 72 hours Hgb A1c No results found for this basename: HGBA1C:2 in the last 72 hours Lipid  Profile No results found for this basename: CHOL:2,HDL:2,LDLCALC:2,TRIG:2,CHOLHDL:2,LDLDIRECT:2 in the last 72 hours Thyroid function studies No results found for this basename: TSH,T4TOTAL,FREET3,T3FREE,THYROIDAB in the last 72 hours Anemia work up  [employer] 10/09/11 1340  VITAMINB12 --  FOLATE --  FERRITIN --  TIBC --  IRON --  RETICCTPCT 27.2*   Microbiology No results found for this or any previous visit (from the past 240 hour(s)).  Procedures and Diagnostic Studies: Dg Chest 2 View  10/06/2011  *RADIOLOGY REPORT*  Clinical Data: Of breath.  History of sickle cell disease.  CHEST - 2 VIEW  Comparison: Two-view chest x-ray 07/14/2011, 06/09/2011, 05/19/2011, 10/20/2010.  Findings: Cardiomediastinal silhouette unremarkable, unchanged. Linear scarring in the right upper lobe, unchanged.  No new pulmonary parenchymal abnormalities.  No pleural effusions. 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  No significant interval change.  IMPRESSION: No acute cardiopulmonary disease.  Linear scarring in the right upper lobe.  Stable examination.  Original Report Authenticated By: Arnell Sieving, M.D.    Scheduled Meds:   . acetaminophen  650 mg Oral Q6H   Or  . acetaminophen  650 mg Rectal Q6H  . cholecalciferol  1,000 Units Oral Daily  . cyclobenzaprine  5 mg Oral TID  . diphenhydrAMINE  25 mg Intravenous Once  . doxycycline  100 mg Oral Q12H  . enoxaparin (LOVENOX) injection  40 mg Subcutaneous Q24H  . FLUoxetine  20 mg Oral QHS  . folic acid  1 mg Oral Daily  . hydroxyurea  1,000 mg Oral Q breakfast  . ibuprofen  800 mg Oral Q8H  . mupirocin ointment  1 application Nasal BID  . oxyCODONE  15 mg Oral Q6H  . pantoprazole  40 mg Oral Q1200  . senna  1 tablet Oral BID   Continuous Infusions:   . 0.45 % NaCl with KCl 20 mEq / L 1,000 mL (10/10/11 1632)      LOS: 8 days   Gwinda Passe, NP  10/11/2011, 11:25 AM

## 2011-10-11 NOTE — Progress Notes (Signed)
Director of Nursing was on the floor and alerted this RN to "what sounds like a pretty heated argument" overheard in patient's room as she passed by.  Proceeded to room and patient said, "I've calmed down now, it's okay."  This RN explained that a high-level staff had been walking in the halls, and verbalized concern over the tone of the speech.   The patient responded, "I was pretty upset but I'm okay now."  She denied having any needs.  Will continue to monitor.

## 2011-10-11 NOTE — Progress Notes (Signed)
IV team attempted to start a peripheral IV this a.m., due to anticipated wait for a PICC (PICC nurses at [facility] this a.m, before rounding at [place]).  Unsuccessful in establishing peripheral access.  Explained to the patient that due to the holiday, PICC line placement may not occur until later in the day.  IM dilaudid available for pain control, plus scheduled Oxycontin IR.  Noted that MD progress notes discuss the patient going home soon?  Will continue to monitor.

## 2011-10-11 NOTE — Progress Notes (Signed)
Peripherally Inserted Central Catheter/Midline Placement  The IV Nurse has discussed with the patient and/or persons authorized to consent for the patient, the purpose of this procedure and the potential benefits and risks involved with this procedure.  The benefits include less needle sticks, lab draws from the catheter and patient may be discharged home with the catheter.  Risks include, but not limited to, infection, bleeding, blood clot (thrombus formation), and puncture of an artery; nerve damage and irregular heat beat.  Alternatives to this procedure were also discussed.  PICC/Midline Placement Documentation        Katilyn Miltenberger, Lajean Manes 10/11/2011, 12:42 PM

## 2011-10-11 NOTE — Progress Notes (Signed)
Patient is refusing ibuprofen and hydroxurea - per patient, she was advised to hold those meds until results of "blood test" (pregnancy test?) come back.  Will continue to monitor.

## 2011-10-11 NOTE — Progress Notes (Signed)
Patient refused her lovenox due to her stated concerns that she might be pregnant.

## 2011-10-12 LAB — COMPREHENSIVE METABOLIC PANEL
ALT: 42 U/L — ABNORMAL HIGH (ref 0–35)
AST: 26 U/L (ref 0–37)
Alkaline Phosphatase: 58 U/L (ref 39–117)
CO2: 25 mEq/L (ref 19–32)
Calcium: 8.9 mg/dL (ref 8.4–10.5)
Chloride: 101 mEq/L (ref 96–112)
GFR calc non Af Amer: >90 mL/min (ref >90)
Potassium: 4.2 mEq/L (ref 3.5–5.1)
Sodium: 136 mEq/L (ref 135–145)

## 2011-10-12 LAB — CBC
MCH: 33.1 pg (ref 26.0–34.0)
Platelets: 403 10*3/uL — ABNORMAL HIGH (ref 150–400)
RBC: 2.57 MIL/uL — ABNORMAL LOW (ref 3.87–5.11)
WBC: 14.3 10*3/uL — ABNORMAL HIGH (ref 4.0–10.5)

## 2011-10-12 MED ORDER — LIDOCAINE 5 % EX PTCH
1.0000 | MEDICATED_PATCH | CUTANEOUS | Status: DC
  Administered 2011-10-12: 1 via TRANSDERMAL
  Filled 2011-10-12 (×3): qty 1

## 2011-10-12 MED ORDER — IBUPROFEN 600 MG PO TABS
600.0000 mg | ORAL_TABLET | Freq: Three times a day (TID) | ORAL | Status: DC
  Filled 2011-10-12 (×8): qty 1

## 2011-10-12 MED ORDER — HYDROMORPHONE HCL PF 1 MG/ML IJ SOLN
0.5000 mg | INTRAMUSCULAR | Status: DC | PRN
  Administered 2011-10-12 – 2011-10-13 (×10): 1 mg via INTRAVENOUS
  Filled 2011-10-12 (×9): qty 1

## 2011-10-12 NOTE — Progress Notes (Signed)
Subjective:  Patient gradually feeling better. She notes that her back pain is still a 7/10.this is improved from before. She denies chest pains or shortness of breath. She is still requiring pain medication at this time. No other new complaints.   Allergies  Allergen Reactions  . Ketorolac Tromethamine Hives and Itching   Current Facility-Administered Medications  Medication Dose Route Frequency Provider Last Rate Last Dose  . 0.45 % NaCl with KCl 20 mEq / L infusion   Intravenous Continuous Gwenyth Bender, MD 75 mL/hr at 10/12/11 1411    . acetaminophen (TYLENOL) tablet 650 mg  650 mg Oral Q6H Garnetta Buddy, MD   650 mg at 10/12/11 2335   Or  . acetaminophen (TYLENOL) suppository 650 mg  650 mg Rectal Q6H Garnetta Buddy, MD      . cholecalciferol (VITAMIN D) tablet 1,000 Units  1,000 Units Oral Daily Ozella Rocks, MD   1,000 Units at 10/12/11 1325  . cyclobenzaprine (FLEXERIL) tablet 5 mg  5 mg Oral TID Gwenyth Bender, MD   5 mg at 10/12/11 2127  . diphenhydrAMINE (BENADRYL) capsule 25-50 mg  25-50 mg Oral Q4H PRN Ozella Rocks, MD       Or  . diphenhydrAMINE (BENADRYL) injection 12.5-25 mg  12.5-25 mg Intravenous Q4H PRN Ozella Rocks, MD      . diphenhydrAMINE (BENADRYL) injection 25 mg  25 mg Intravenous Once Gwenyth Bender, MD      . doxycycline (VIBRA-TABS) tablet 100 mg  100 mg Oral Q12H Gwenyth Bender, MD   100 mg at 10/12/11 2127  . enoxaparin (LOVENOX) injection 40 mg  40 mg Subcutaneous Q24H Garnetta Buddy, MD   40 mg at 10/10/11 1214  . FLUoxetine (PROZAC) tablet 20 mg  20 mg Oral QHS Ozella Rocks, MD   20 mg at 10/12/11 2127  . folic acid (FOLVITE) tablet 1 mg  1 mg Oral Daily Ozella Rocks, MD   1 mg at 10/12/11 1326  . HYDROmorphone (DILAUDID) injection 0.5-1 mg  0.5-1 mg Intravenous Q2H PRN Gwenyth Bender, MD   1 mg at 10/12/11 2137  . hydroxyurea (HYDREA) capsule 1,000 mg  1,000 mg Oral Q breakfast Ozella Rocks, MD   1,000 mg at 10/08/11 0800  . ibuprofen  (ADVIL,MOTRIN) tablet 600 mg  600 mg Oral TID Gwenyth Bender, MD      . ibuprofen (ADVIL,MOTRIN) tablet 800 mg  800 mg Oral Q8H Garnetta Buddy, MD   800 mg at 10/11/11 0657  . lidocaine (LIDODERM) 5 % 1 patch  1 patch Transdermal Q24H Gwenyth Bender, MD   1 patch at 10/12/11 1330  . mupirocin ointment (BACTROBAN) 2 % 1 application  1 application Nasal BID Gwenyth Bender, MD   1 application at 10/12/11 1327  . naloxone [facility]) injection 0.4 mg  0.4 mg Intravenous PRN Ozella Rocks, MD       And  . sodium chloride 0.9 % injection 9 mL  9 mL Intravenous PRN Ozella Rocks, MD      . ondansetron [facility]) injection 4 mg  4 mg Intravenous Q6H PRN Ozella Rocks, MD   4 mg at 10/06/11 1246  . oxyCODONE (Oxy IR/ROXICODONE) immediate release tablet 15 mg  15 mg Oral Q6H Garnetta Buddy, MD   15 mg at 10/12/11 2336  . pantoprazole (PROTONIX) EC tablet 40 mg  40 mg Oral Q1200 Ozella Rocks, MD  40 mg at 10/12/11 1326  . promethazine (PHENERGAN) tablet 12.5-25 mg  12.5-25 mg Oral Q4H PRN Ozella Rocks, MD   12.5 mg at 10/04/11 1955   Or  . promethazine (PHENERGAN) suppository 12.5-25 mg  12.5-25 mg Rectal Q4H PRN Ozella Rocks, MD      . Gwyndolyn [facility]) tablet 8.6 mg  1 tablet Oral BID Ozella Rocks, MD   8.6 mg at 10/12/11 2127  . sodium chloride 0.9 % injection 10-40 mL  10-40 mL Intracatheter Q12H Gwenyth Bender, MD   40 mL at 10/11/11 1300  . sodium chloride 0.9 % injection 10-40 mL  10-40 mL Intracatheter PRN Gwenyth Bender, MD      . DISCONTD: HYDROmorphone (DILAUDID) injection 1-2 mg  1-2 mg Intravenous Q2H PRN Gwenyth Bender, MD   2 mg at 10/12/11 4098  . DISCONTD: HYDROmorphone (DILAUDID) injection 2 mg  2 mg Intramuscular Q2H PRN Gwenyth Bender, MD   2 mg at 10/11/11 0946    Objective: Blood pressure 103/64, pulse 93, temperature 97.8 F (36.6 C), temperature source Oral, resp. rate 18, height 5\' 3"  (1.6 m), weight 184 lb 8.4 oz (83.7 kg), last menstrual period 09/17/2011, SpO2  96.00%.  Well-developed well-nourished black female in no acute distress. HEENT:no sinus tenderness. No sclera icterus. NECK:no enlarged thyroid. No posterior cervical nodes. LUNGS:clear to auscultation. No vocal fremitus. JX:BJYNWG S1, S2 without S3. ABD:no epigastric tenderness. NFA:OZHYQMVHQI to palpation in the midthoracic region. NEURO:intact.  Lab results: Results for orders placed during the hospital encounter of 10/03/11 (from the past 48 hour(s))  PREGNANCY, URINE     Status: Normal   Collection Time   10/11/11 12:25 AM      Component Value Range Comment   Preg Test, Ur NEGATIVE  NEGATIVE    CBC     Status: Abnormal   Collection Time   10/11/11  4:00 AM      Component Value Range Comment   WBC 13.7 (*) 4.0 - 10.5 (K/uL)    RBC 2.55 (*) 3.87 - 5.11 (MIL/uL)    Hemoglobin 8.4 (*) 12.0 - 15.0 (g/dL)    HCT 69.6 (*) 29.5 - 46.0 (%)    MCV 97.3  78.0 - 100.0 (fL)    MCH 32.9  26.0 - 34.0 (pg)    MCHC 33.9  30.0 - 36.0 (g/dL)    RDW 28.4 (*) 13.2 - 15.5 (%)    Platelets 382  150 - 400 (K/uL)   COMPREHENSIVE METABOLIC PANEL     Status: Abnormal   Collection Time   10/11/11  4:00 AM      Component Value Range Comment   Sodium 138  135 - 145 (mEq/L)    Potassium 4.2  3.5 - 5.1 (mEq/L)    Chloride 105  96 - 112 (mEq/L)    CO2 21  19 - 32 (mEq/L)    Glucose, Bld 92  70 - 99 (mg/dL)    BUN 13  6 - 23 (mg/dL)    Creatinine, Ser 4.40  0.50 - 1.10 (mg/dL)    Calcium 8.8  8.4 - 10.5 (mg/dL)    Total Protein 6.9  6.0 - 8.3 (g/dL)    Albumin 3.6  3.5 - 5.2 (g/dL)    AST 37  0 - 37 (U/L)    ALT 46 (*) 0 - 35 (U/L)    Alkaline Phosphatase 59  39 - 117 (U/L)    Total Bilirubin 1.5 (*) 0.3 - 1.2 (  mg/dL)    GFR calc non Af Amer >90  >90 (mL/min)    GFR calc Af Amer >90  >90 (mL/min)   CBC     Status: Abnormal   Collection Time   10/11/11  9:35 PM      Component Value Range Comment   WBC 13.4 (*) 4.0 - 10.5 (K/uL)    RBC 2.51 (*) 3.87 - 5.11 (MIL/uL)    Hemoglobin 8.4 (*) 12.0 -  15.0 (g/dL)    HCT 29.5 (*) 62.1 - 46.0 (%)    MCV 94.8  78.0 - 100.0 (fL)    MCH 33.5  26.0 - 34.0 (pg)    MCHC 35.3  30.0 - 36.0 (g/dL)    RDW 30.8 (*) 65.7 - 15.5 (%)    Platelets 430 (*) 150 - 400 (K/uL)   CBC     Status: Abnormal   Collection Time   10/12/11  6:10 AM      Component Value Range Comment   WBC 14.3 (*) 4.0 - 10.5 (K/uL)    RBC 2.57 (*) 3.87 - 5.11 (MIL/uL)    Hemoglobin 8.5 (*) 12.0 - 15.0 (g/dL)    HCT 84.6 (*) 96.2 - 46.0 (%)    MCV 95.3  78.0 - 100.0 (fL)    MCH 33.1  26.0 - 34.0 (pg)    MCHC 34.7  30.0 - 36.0 (g/dL)    RDW 95.2 (*) 84.1 - 15.5 (%)    Platelets 403 (*) 150 - 400 (K/uL)   COMPREHENSIVE METABOLIC PANEL     Status: Abnormal   Collection Time   10/12/11  6:10 AM      Component Value Range Comment   Sodium 136  135 - 145 (mEq/L)    Potassium 4.2  3.5 - 5.1 (mEq/L)    Chloride 101  96 - 112 (mEq/L)    CO2 25  19 - 32 (mEq/L)    Glucose, Bld 99  70 - 99 (mg/dL)    BUN 10  6 - 23 (mg/dL)    Creatinine, Ser 3.24  0.50 - 1.10 (mg/dL)    Calcium 8.9  8.4 - 10.5 (mg/dL)    Total Protein 6.9  6.0 - 8.3 (g/dL)    Albumin 3.6  3.5 - 5.2 (g/dL)    AST 26  0 - 37 (U/L)    ALT 42 (*) 0 - 35 (U/L)    Alkaline Phosphatase 58  39 - 117 (U/L)    Total Bilirubin 1.6 (*) 0.3 - 1.2 (mg/dL)    GFR calc non Af Amer >90  >90 (mL/min)    GFR calc Af Amer >90  >90 (mL/min)     Studies/Results: US Transvaginal Non-ob  10/11/2011  *RADIOLOGY REPORT*  Clinical Data: Right lower quadrant pain  TRANSABDOMINAL AND TRANSVAGINAL ULTRASOUND OF PELVIS Technique:  Both transabdominal and transvaginal ultrasound examinations of the pelvis were performed. Transabdominal technique was performed for global imaging of the pelvis including uterus, ovaries, adnexal regions, and pelvic cul-de-sac.  Comparison: February 20, 2011   It was necessary to proceed with endovaginal exam following the transabdominal exam to visualize the adnexa.  Findings:  The uterus is normal in size and  echotexture, measuring 6.8 x 3.0 x 3.6 cm.  Endometrial stripe is thin and homogeneous, measuring 2.5 mm in width.  The left ovary has a normal size and appearance, measuring 2.3 x 1.4 x 2.2 cm.  The right ovary measures 3.5 x 2.5 x 2.5 cm and contains a 2.6  cm simple cyst.  There are no adnexal masses or free pelvic fluid.  IMPRESSION: 2.6 cm simple cyst on the right ovary is within normal limits.  Original Report Authenticated By: Brandon Melnick, M.D.   US Pelvis Complete  10/11/2011  *RADIOLOGY REPORT*  Clinical Data: Right lower quadrant pain  TRANSABDOMINAL AND TRANSVAGINAL ULTRASOUND OF PELVIS Technique:  Both transabdominal and transvaginal ultrasound examinations of the pelvis were performed. Transabdominal technique was performed for global imaging of the pelvis including uterus, ovaries, adnexal regions, and pelvic cul-de-sac.  Comparison: February 20, 2011   It was necessary to proceed with endovaginal exam following the transabdominal exam to visualize the adnexa.  Findings:  The uterus is normal in size and echotexture, measuring 6.8 x 3.0 x 3.6 cm.  Endometrial stripe is thin and homogeneous, measuring 2.5 mm in width.  The left ovary has a normal size and appearance, measuring 2.3 x 1.4 x 2.2 cm.  The right ovary measures 3.5 x 2.5 x 2.5 cm and contains a 2.6 cm simple cyst.  There are no adnexal masses or free pelvic fluid.  IMPRESSION: 2.6 cm simple cyst on the right ovary is within normal limits.  Original Report Authenticated By: Brandon Melnick, M.D.    Patient Active Problem List  Diagnoses  . SICKLE CELL ANEMIA  . TRICHOTILLOMANIA  . Active smoker  . Back pain  . Depression  . GERD (gastroesophageal reflux disease)  . Contraception management  . Stress  . Overweight  . Vaso-occlusive sickle cell crisis    Impression: Resolving sickle cell crisis. Midthoracic back pain. Rule out musculoligamentous strain. Anxiety disorder. Patient with strong desire to become pregnant again  as well. Trichotillomania   Plan: Continue NSAIDs with muscle relaxants. Taper IV fluids. Progress to discharge in 24 hours.   August Saucer, Jadee Golebiewski 10/12/2011 11:51 PM

## 2011-10-13 ENCOUNTER — Ambulatory Visit: Payer: Medicaid Other | Admitting: Family Medicine

## 2011-10-13 LAB — CBC
Hemoglobin: 8.7 g/dL — ABNORMAL LOW (ref 12.0–15.0)
MCH: 33.3 pg (ref 26.0–34.0)
MCHC: 35.7 g/dL (ref 30.0–36.0)
RDW: 17.3 % — ABNORMAL HIGH (ref 11.5–15.5)

## 2011-10-13 MED ORDER — OXYCODONE HCL 15 MG PO TABS: 15.0000 mg | ORAL_TABLET | ORAL | Status: DC | PRN

## 2011-10-13 MED ORDER — CYCLOBENZAPRINE HCL 5 MG PO TABS: 5.0000 mg | ORAL_TABLET | Freq: Two times a day (BID) | ORAL | Status: DC | PRN

## 2011-10-13 MED ORDER — OXYCODONE HCL 10 MG PO TABS: 10.0000 mg | ORAL_TABLET | Freq: Four times a day (QID) | ORAL | Status: DC | PRN

## 2011-10-13 MED ORDER — DOXYCYCLINE HYCLATE 100 MG PO TABS: 100.0000 mg | ORAL_TABLET | Freq: Two times a day (BID) | ORAL | Status: AC

## 2011-10-13 NOTE — Discharge Summary (Signed)
Discharge Summary  HARMONEY SIENKIEWICZ MR#: [id number]  DOB:1990/06/23  Date of Admission: 10/03/2011 Date of Discharge: 10/13/2011  Patient's PCP: [name], MD, MD  Attending Physician:Saxon Barich  Consults:  Dr. August Saucer    Discharge Diagnoses: Principal Problem:  *Vaso-occlusive sickle cell crisis Active Problems:  SICKLE CELL ANEMIA  TRICHOTILLOMANIA  Depression   Brief Admitting History   [name] is a 21 y.o. year old female presenting with h/o back pain that has progressivley worsened. Initially controlled w/ Oxycodone and ibuprofen. Pain is worse in her L upper back region w/ areas of point tenderness in the L upper perispinal region. Mild chest discomfort that has been present for weeks and occurs w/ deep inspiration. Admitted for management of SCD crisis  Discharge Medications Medication List  As of 10/13/2011  9:58 AM   ASK your doctor about these medications         cholecalciferol 1000 UNITS tablet   Commonly known as: VITAMIN D   Take 1,000 Units by mouth daily.      FLUoxetine 20 MG tablet   Commonly known as: PROZAC   Take 20 mg by mouth at bedtime.      hydroxyurea 500 MG capsule   Commonly known as: HYDREA   Take 1,000 mg by mouth daily. May take with food to minimize GI side effects.      ibuprofen 200 MG tablet   Commonly known as: ADVIL,MOTRIN   Take 600 mg by mouth daily.      Oxycodone HCl 10 MG Tabs   Take 10 mg by mouth 4 (four) times daily as needed. For pain.      pantoprazole 40 MG tablet   Commonly known as: PROTONIX   Take 40 mg by mouth every morning.      prenatal multivitamin 60-1 MG tablet   Take 1 tablet by mouth every morning.            Hospital Course: Vaso-occlusive sickle cell crisis Present on Admission:  .Depression .TRICHOTILLOMANIA .Vaso-occlusive sickle cell crisis    Day of Discharge BP 90/54  Pulse 100  Temp(Src) 99.2 F (37.3 C) (Oral)  Resp 18  Ht 5\' 3"  (1.6 m)  Wt 83.7 kg (184 lb  8.4 oz)  BMI 32.69 kg/m2  SpO2 100%  LMP 09/17/2011  General Appearance: Alert and oriented, cooperative, well nourished, well developed, no acute distress (pain 6/10 manageable at home) Head: Normocephalic, atraumatic  Eyes: PERRLA, EOMI, scleral icterus  Nose: Nares, septum and mucosa are normal, no visible drainage, no sinus tenderness  Throat: Lips, mucosa, tongue, teeth and gums are normal  Neck: Trachea is midline, no lymphophadenopathy, mass or thyromegaly  Back: Symmetric, no curvature, bilateral CVA tenderness, diffuse tenderness  Resp: Diminished breath sounds bibasilar and bilaterally, CTA, no wheezes/rales/rhonchi  Cardio: Regular rate and rhythm, S1, S2 normal, soft murmur, no click/rub/gallop  GI: Soft, distended, hypoactive bowel sounds, no organomegaly  Extremities: Atraumatic, no cyanosis or edema, Homans sign is negative, no sign of DVT  Pulses: 2+ and symmetric  Skin: Skin color, texture and turgor are normal,  Neurologic: Grossly normal, AO x 3, CN II - XII intact, no focal deficits  Psych: Flat/ appropriate affect      Results for orders placed during the hospital encounter of 10/03/11 (from the past 48 hour(s))  CBC     Status: Abnormal   Collection Time   10/11/11  9:35 PM      Component Value Range Comment   WBC 13.4 (*)  4.0 - 10.5 (K/uL)    RBC 2.51 (*) 3.87 - 5.11 (MIL/uL)    Hemoglobin 8.4 (*) 12.0 - 15.0 (g/dL)    HCT 16.1 (*) 09.6 - 46.0 (%)    MCV 94.8  78.0 - 100.0 (fL)    MCH 33.5  26.0 - 34.0 (pg)    MCHC 35.3  30.0 - 36.0 (g/dL)    RDW 04.5 (*) 40.9 - 15.5 (%)    Platelets 430 (*) 150 - 400 (K/uL)   CBC     Status: Abnormal   Collection Time   10/12/11  6:10 AM      Component Value Range Comment   WBC 14.3 (*) 4.0 - 10.5 (K/uL)    RBC 2.57 (*) 3.87 - 5.11 (MIL/uL)    Hemoglobin 8.5 (*) 12.0 - 15.0 (g/dL)    HCT 81.1 (*) 91.4 - 46.0 (%)    MCV 95.3  78.0 - 100.0 (fL)    MCH 33.1  26.0 - 34.0 (pg)    MCHC 34.7  30.0 - 36.0 (g/dL)    RDW  78.2 (*) 95.6 - 15.5 (%)    Platelets 403 (*) 150 - 400 (K/uL)   COMPREHENSIVE METABOLIC PANEL     Status: Abnormal   Collection Time   10/12/11  6:10 AM      Component Value Range Comment   Sodium 136  135 - 145 (mEq/L)    Potassium 4.2  3.5 - 5.1 (mEq/L)    Chloride 101  96 - 112 (mEq/L)    CO2 25  19 - 32 (mEq/L)    Glucose, Bld 99  70 - 99 (mg/dL)    BUN 10  6 - 23 (mg/dL)    Creatinine, Ser 2.13  0.50 - 1.10 (mg/dL)    Calcium 8.9  8.4 - 10.5 (mg/dL)    Total Protein 6.9  6.0 - 8.3 (g/dL)    Albumin 3.6  3.5 - 5.2 (g/dL)    AST 26  0 - 37 (U/L)    ALT 42 (*) 0 - 35 (U/L)    Alkaline Phosphatase 58  39 - 117 (U/L)    Total Bilirubin 1.6 (*) 0.3 - 1.2 (mg/dL)    GFR calc non Af Amer >90  >90 (mL/min)    GFR calc Af Amer >90  >90 (mL/min)   CBC     Status: Abnormal   Collection Time   10/13/11  6:15 AM      Component Value Range Comment   WBC 12.7 (*) 4.0 - 10.5 (K/uL)    RBC 2.61 (*) 3.87 - 5.11 (MIL/uL)    Hemoglobin 8.7 (*) 12.0 - 15.0 (g/dL)    HCT 08.6 (*) 57.8 - 46.0 (%)    MCV 93.5  78.0 - 100.0 (fL)    MCH 33.3  26.0 - 34.0 (pg)    MCHC 35.7  30.0 - 36.0 (g/dL)    RDW 46.9 (*) 62.9 - 15.5 (%)    Platelets 399  150 - 400 (K/uL)     Dg Chest 2 View  10/06/2011  *RADIOLOGY REPORT*  Clinical Data: Of breath.  History of sickle cell disease.  CHEST - 2 VIEW  Comparison: Two-view chest x-ray 07/14/2011, 06/09/2011, 05/19/2011, 10/20/2010.  Findings: Cardiomediastinal silhouette unremarkable, unchanged. Linear scarring in the right upper lobe, unchanged.  No new pulmonary parenchymal abnormalities.  No pleural effusions. 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  No significant interval change.  IMPRESSION: No acute cardiopulmonary disease.  Linear scarring  in the right upper lobe.  Stable examination.  Original Report Authenticated By: Arnell Sieving, M.D.   US Transvaginal Non-ob  10/11/2011  *RADIOLOGY REPORT*  Clinical Data: Right lower quadrant pain   TRANSABDOMINAL AND TRANSVAGINAL ULTRASOUND OF PELVIS Technique:  Both transabdominal and transvaginal ultrasound examinations of the pelvis were performed. Transabdominal technique was performed for global imaging of the pelvis including uterus, ovaries, adnexal regions, and pelvic cul-de-sac.  Comparison: February 20, 2011   It was necessary to proceed with endovaginal exam following the transabdominal exam to visualize the adnexa.  Findings:  The uterus is normal in size and echotexture, measuring 6.8 x 3.0 x 3.6 cm.  Endometrial stripe is thin and homogeneous, measuring 2.5 mm in width.  The left ovary has a normal size and appearance, measuring 2.3 x 1.4 x 2.2 cm.  The right ovary measures 3.5 x 2.5 x 2.5 cm and contains a 2.6 cm simple cyst.  There are no adnexal masses or free pelvic fluid.  IMPRESSION: 2.6 cm simple cyst on the right ovary is within normal limits.  Original Report Authenticated By: Brandon Melnick, M.D.   US Pelvis Complete  10/11/2011  *RADIOLOGY REPORT*  Clinical Data: Right lower quadrant pain  TRANSABDOMINAL AND TRANSVAGINAL ULTRASOUND OF PELVIS Technique:  Both transabdominal and transvaginal ultrasound examinations of the pelvis were performed. Transabdominal technique was performed for global imaging of the pelvis including uterus, ovaries, adnexal regions, and pelvic cul-de-sac.  Comparison: February 20, 2011   It was necessary to proceed with endovaginal exam following the transabdominal exam to visualize the adnexa.  Findings:  The uterus is normal in size and echotexture, measuring 6.8 x 3.0 x 3.6 cm.  Endometrial stripe is thin and homogeneous, measuring 2.5 mm in width.  The left ovary has a normal size and appearance, measuring 2.3 x 1.4 x 2.2 cm.  The right ovary measures 3.5 x 2.5 x 2.5 cm and contains a 2.6 cm simple cyst.  There are no adnexal masses or free pelvic fluid.  IMPRESSION: 2.6 cm simple cyst on the right ovary is within normal limits.  Original Report  Authenticated By: Brandon Melnick, M.D.     Disposition: Home   Diet: Healthy Heart  Activity: As tolerated    Follow-up Appts: Discharge Orders    Future Appointments: Provider: Department: Dept Phone: Center:   10/13/2011 1:45 PM Dawn Wilson Singer, MD [facility] [phone] [facility]      TESTS THAT NEED FOLLOW-UP To be determine on follow up appoint  Time spent on discharge, talking to the patient, and coordinating care: 60 mins.   SignedGwinda Passe [phone] 10/13/2011, 9:58 AM

## 2011-10-14 LAB — CBC
MCH: 32.6 pg (ref 26.0–34.0)
MCV: 93.7 fL (ref 78.0–100.0)
Platelets: 343 10*3/uL (ref 150–400)
RDW: 16.6 % — ABNORMAL HIGH (ref 11.5–15.5)
WBC: 14.6 10*3/uL — ABNORMAL HIGH (ref 4.0–10.5)

## 2011-10-14 NOTE — Progress Notes (Signed)
DC instructions given to pt. Pt states she will be leaving @ 0930 this AM. Ride home driver is in room w/pt. 2 scripts for oxycodone w/dc papers ( 10 mg Q4h prn & 15 mg Q4h prn). Spoke w/M.Randa Evens, NP. Instructed to give PT. Script for 10 mg Q4h prn only. Discontinue of 15mg  Q4h prn noted on paper copy. Paper script for Oxy 15mg  marked "void" & placed in shred box w/Fred Wynelle Fanny, Charity fundraiser as witness.Hartley Barefoot

## 2011-10-14 NOTE — Progress Notes (Signed)
DC to home. To car by wc. No change from AM assessment. No request for pain meds this shift.[name]

## 2011-10-19 ENCOUNTER — Telehealth (HOSPITAL_COMMUNITY): Payer: Self-pay | Admitting: General Practice

## 2011-10-22 IMAGING — CR DG CHEST 1V PORT
1 series · 1 of 1 positions shown · non-contrast
Comparison: 10/22/2010

CLINICAL DATA: Dysuria, shortness of breath

PORTABLE CHEST - 1 VIEW

[AP]
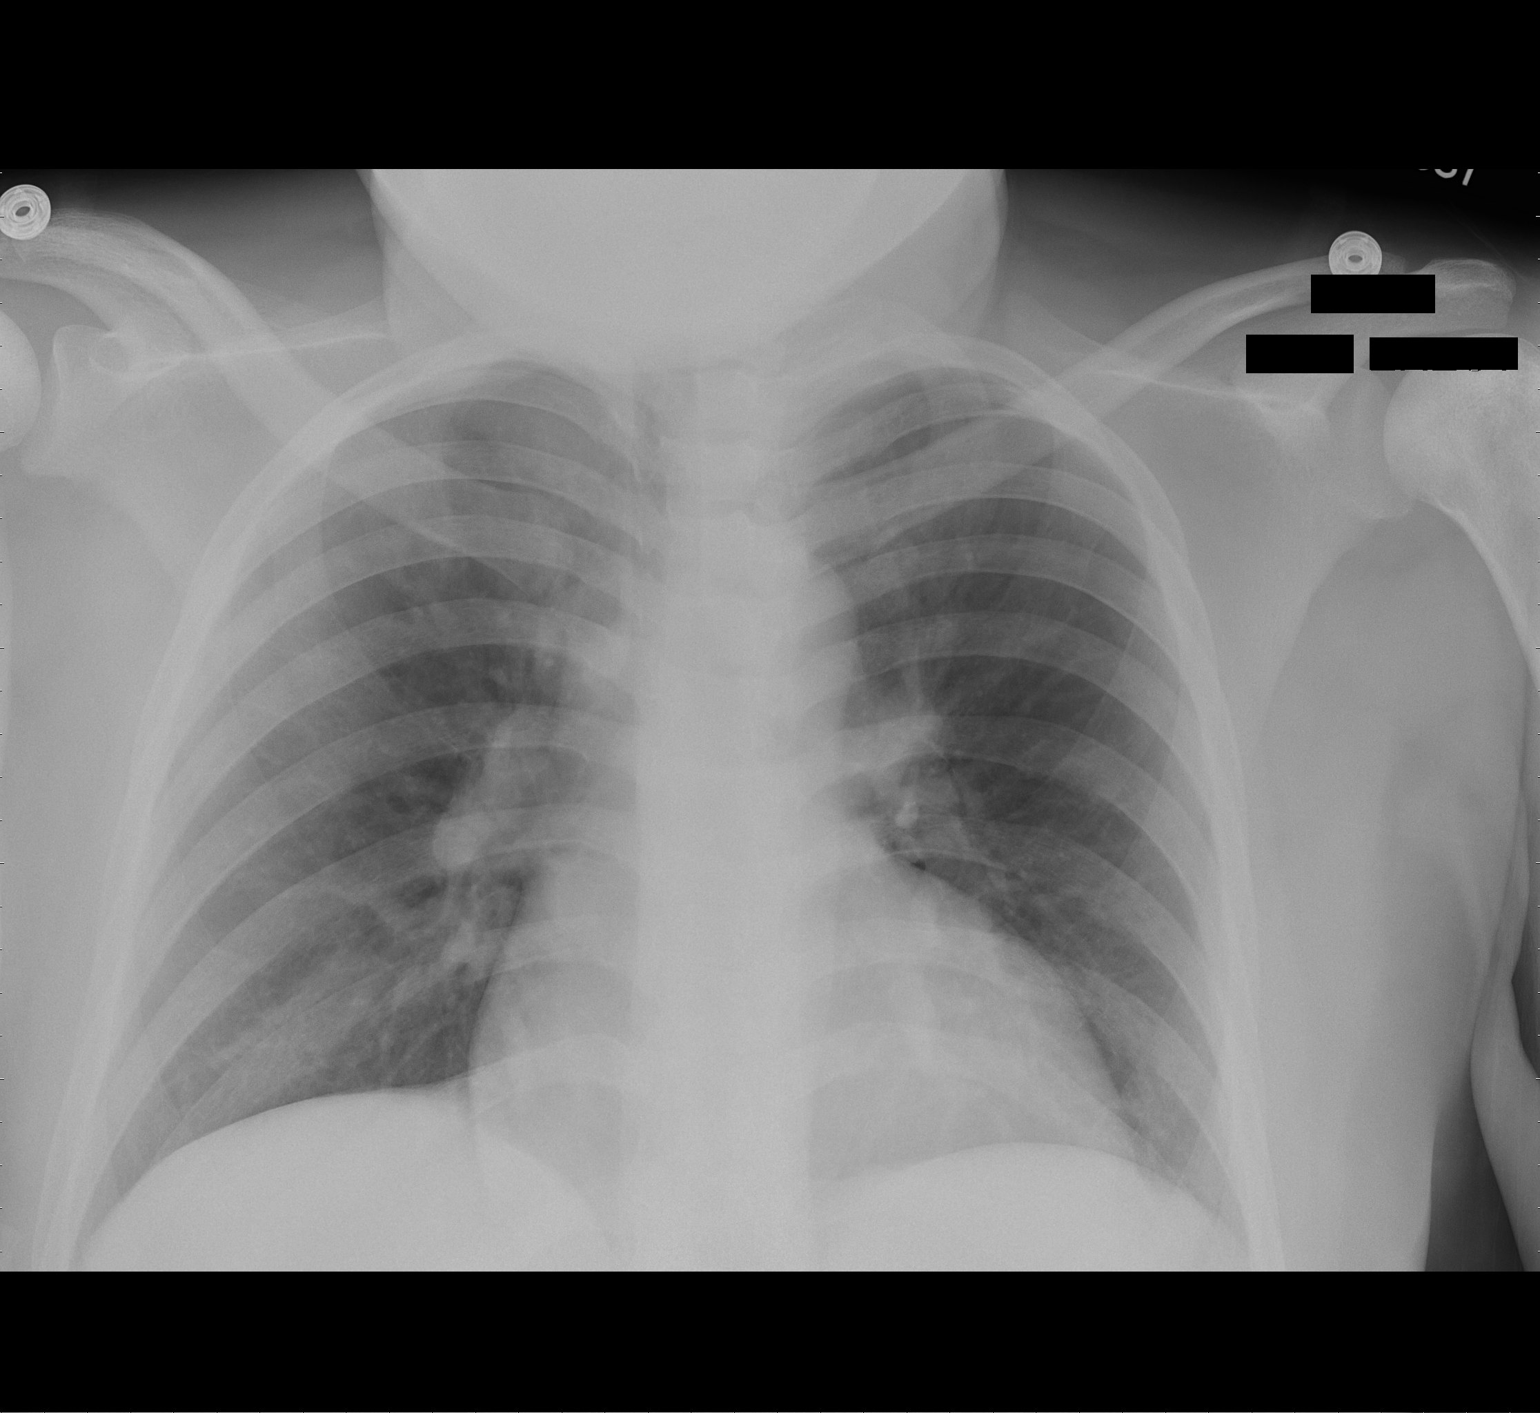

[1 of 1 positions shown; findings below may reference images not displayed]

FINDINGS: Heart size upper limits normal for technique.  Lungs
clear.  No effusion.  Regional bones unremarkable.
IMPRESSION: No acute disease.  Borderline cardiomegaly.

## 2011-10-23 IMAGING — US US TRANSVAGINAL NON-OB
1 series · 14 of 25 positions shown · non-contrast
Comparison: CT 06/28/2010

CLINICAL DATA: Left pelvic pain, sickle cell crisis

TRANSABDOMINAL AND TRANSVAGINAL ULTRASOUND OF PELVIS
TECHNIQUE: Both transabdominal and transvaginal ultrasound
examinations of the pelvis were performed. Transabdominal technique
was performed for global imaging of the pelvis including uterus,
ovaries, adnexal regions, and pelvic cul-de-sac.

[Series 1: us transvaginal non-ob · 0.30mm/px · 14 of 48 slices shown]
[im 1/48]
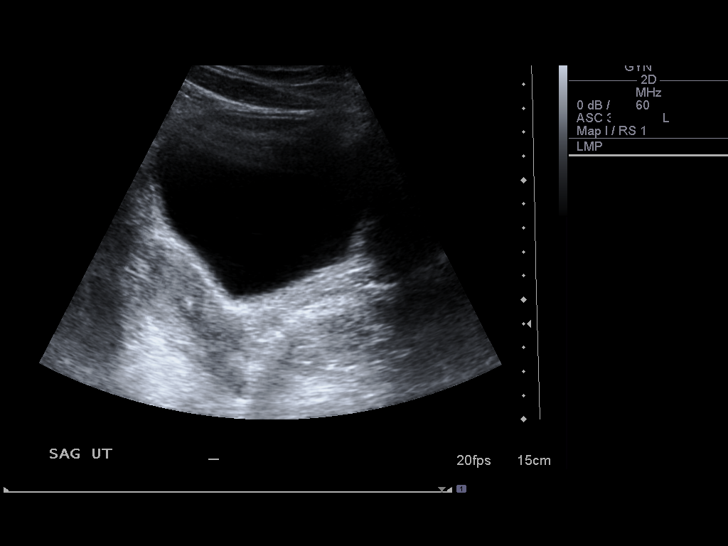
[im 4/48]
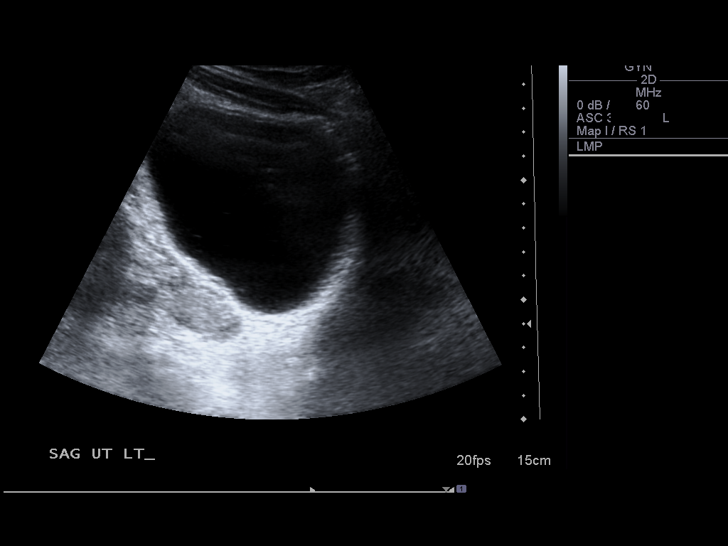
[im 8/48]
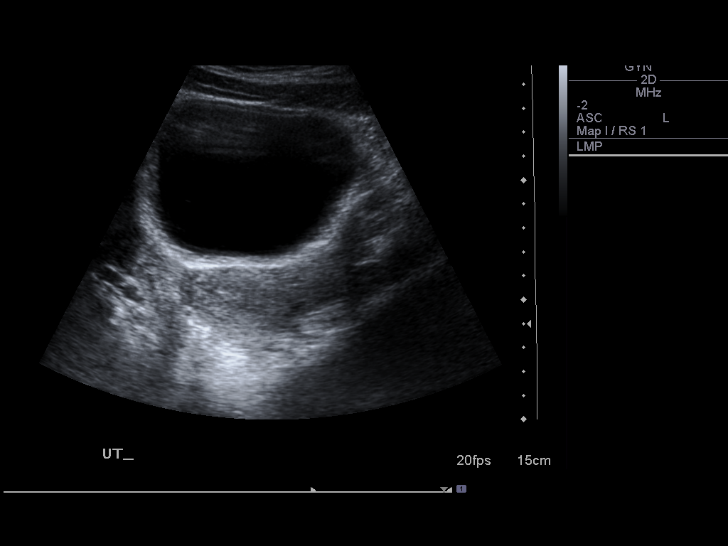
[im 12/48]
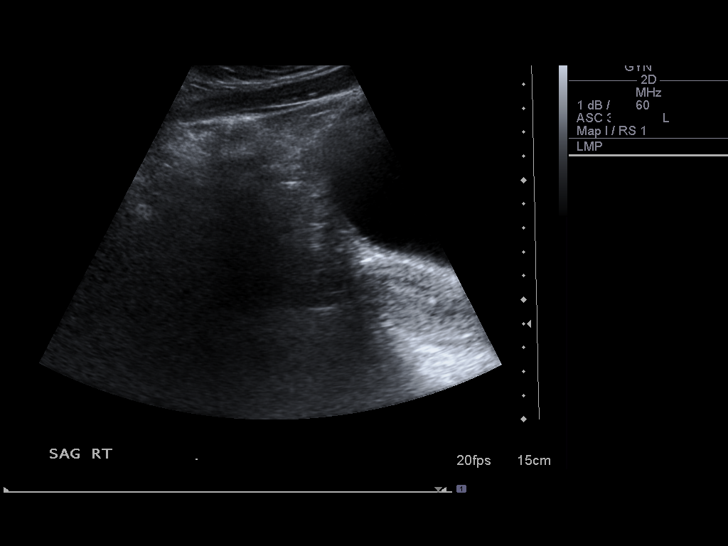
[im 16/48]
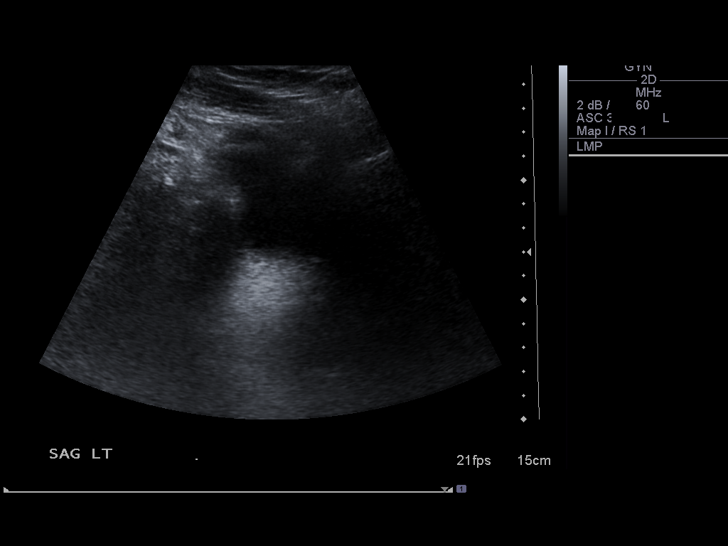
[im 18/48]
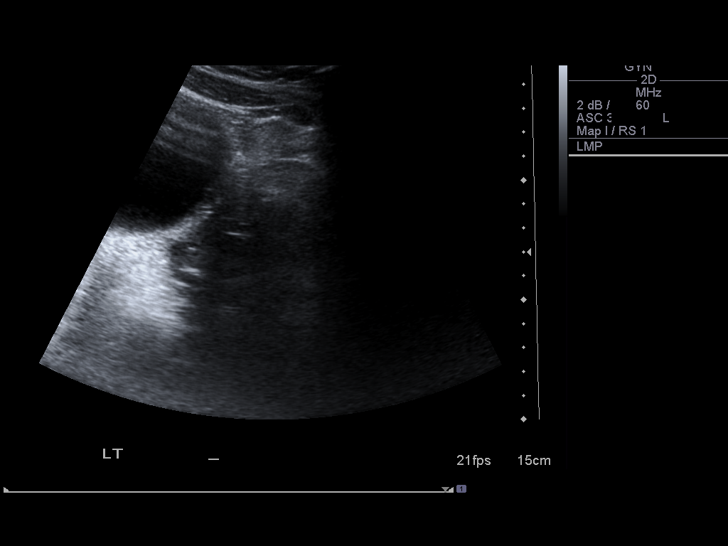
[im 22/48]
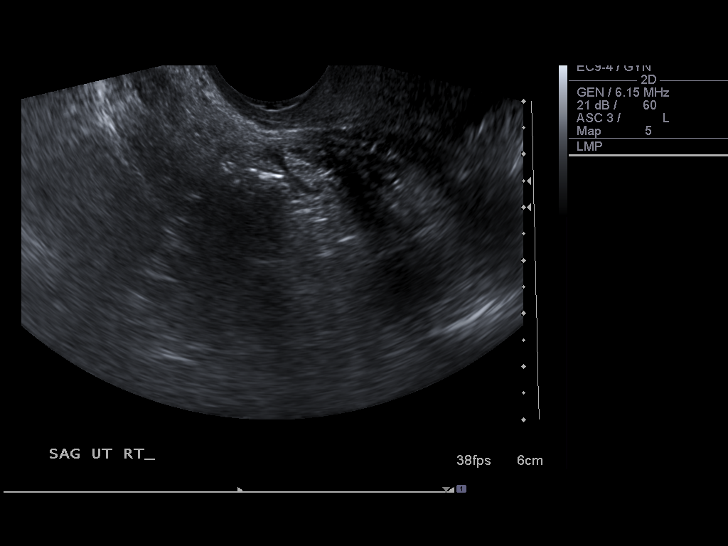
[im 26/48]
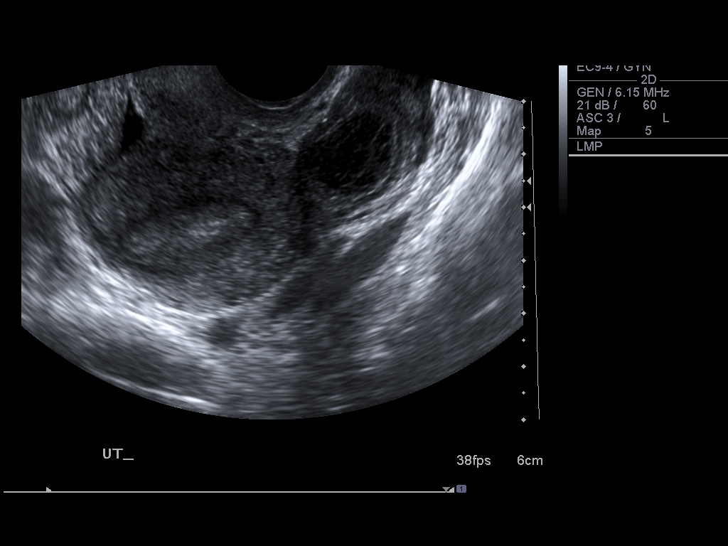
[im 30/48]
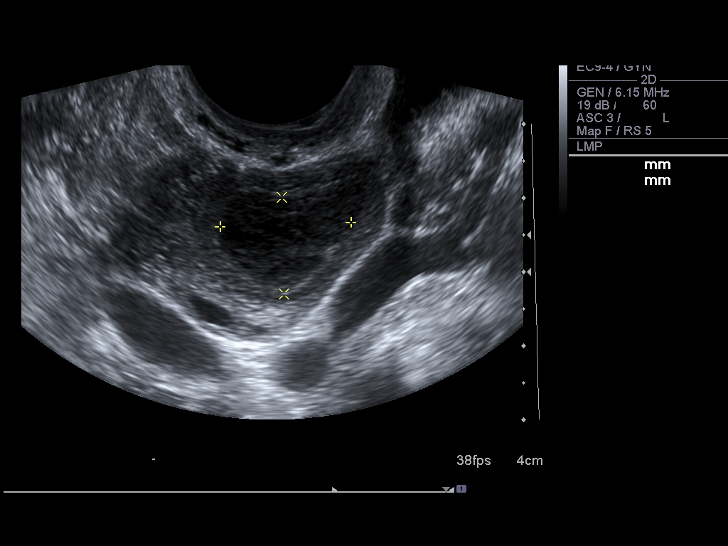
[im 32/48]
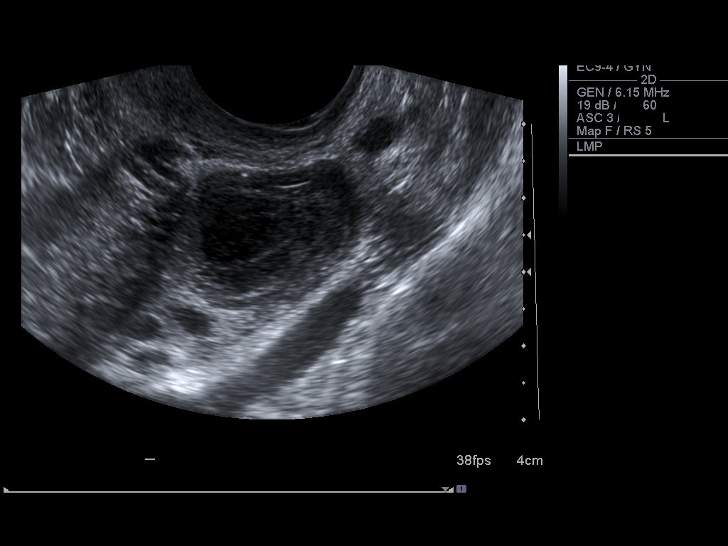
[im 36/48]
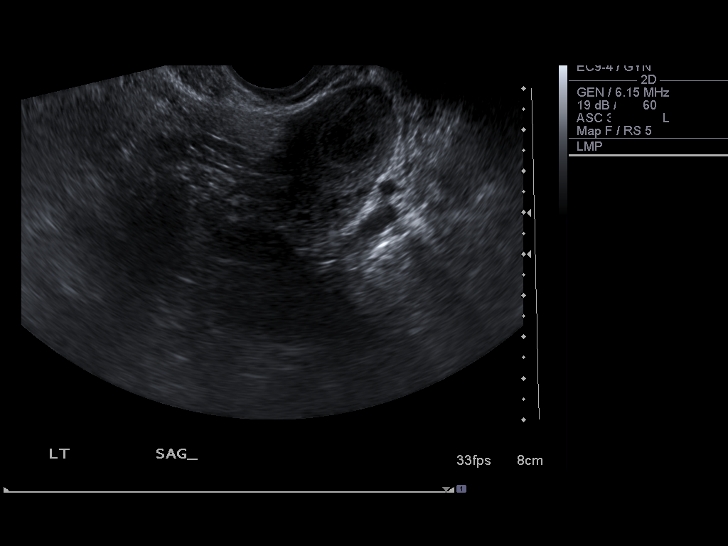
[im 40/48]
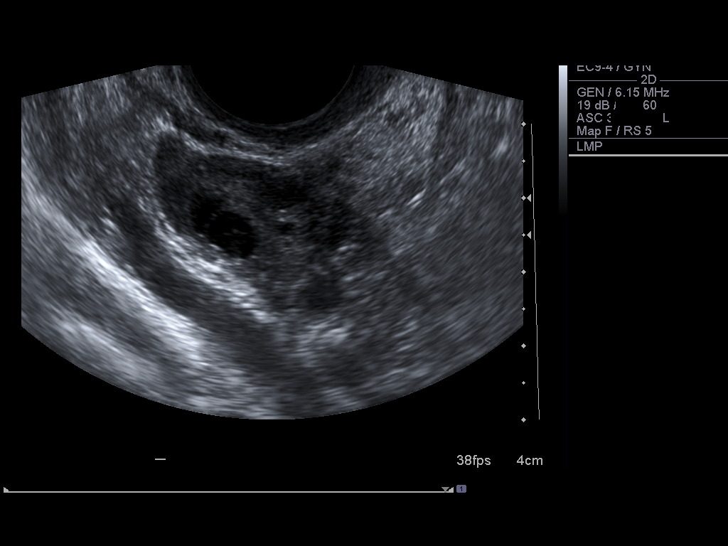
[im 44/48]
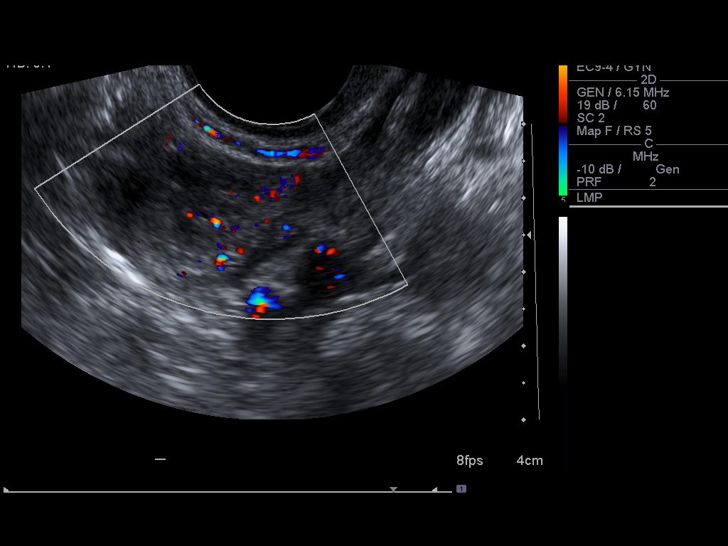
[im 48/48]
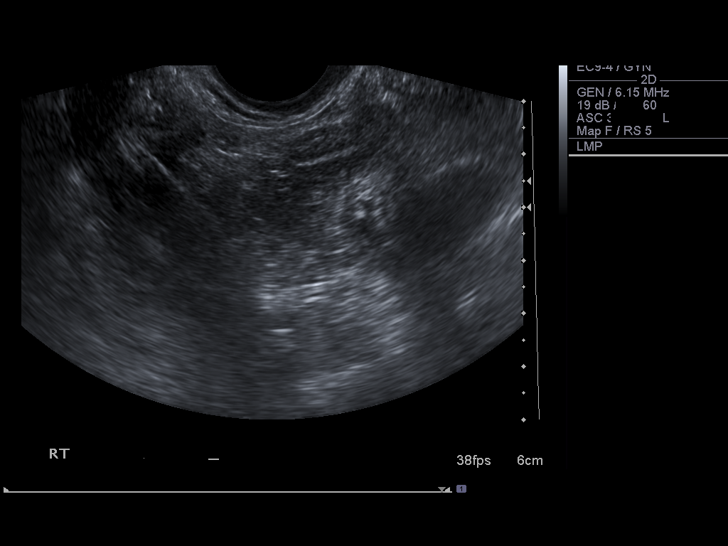

[14 of 25 positions shown; findings below may reference images not displayed]

It was necessary to proceed with endovaginal exam following the
transabdomnial exam to visualize the right ovary.
FINDINGS: Uterus: 2.9 x 5.8 x 7.7 cm, unremarkable

Endometrium: 7.1 mm total thickness, unremarkable

Right ovary:  16 x 23 x 32 mm, unremarkable

Left ovary: 24 x 25 x 39 mm, containing a complex 10 x 13 x 17 mm
cyst.

Other findings: No free fluid
IMPRESSION: Normal study. No evidence of pelvic mass or other significant
abnormality.

## 2011-10-24 ENCOUNTER — Telehealth (HOSPITAL_COMMUNITY): Payer: Self-pay | Admitting: General Practice

## 2011-10-24 ENCOUNTER — Encounter (HOSPITAL_COMMUNITY): Payer: Self-pay | Admitting: Hematology

## 2011-10-24 ENCOUNTER — Inpatient Hospital Stay (HOSPITAL_COMMUNITY)
Admission: AD | Admit: 2011-10-24 | Discharge: 2011-11-01 | DRG: 812 | Disposition: A | Discharge status: Home / Self Care | Payer: Medicaid Other | Attending: Internal Medicine | Admitting: Internal Medicine

## 2011-10-24 DIAGNOSIS — K5900 Constipation, unspecified: Secondary | ICD-10-CM | POA: Diagnosis not present

## 2011-10-24 DIAGNOSIS — T8089XA Other complications following infusion, transfusion and therapeutic injection, initial encounter: Secondary | ICD-10-CM | POA: Diagnosis not present

## 2011-10-24 DIAGNOSIS — D5700 Hb-SS disease with crisis, unspecified: Principal | ICD-10-CM | POA: Diagnosis present

## 2011-10-24 DIAGNOSIS — R509 Fever, unspecified: Secondary | ICD-10-CM | POA: Diagnosis present

## 2011-10-24 DIAGNOSIS — D571 Sickle-cell disease without crisis: Secondary | ICD-10-CM

## 2011-10-24 DIAGNOSIS — Y849 Medical procedure, unspecified as the cause of abnormal reaction of the patient, or of later complication, without mention of misadventure at the time of the procedure: Secondary | ICD-10-CM | POA: Diagnosis not present

## 2011-10-24 DIAGNOSIS — F3289 Other specified depressive episodes: Secondary | ICD-10-CM | POA: Diagnosis present

## 2011-10-24 DIAGNOSIS — M25519 Pain in unspecified shoulder: Secondary | ICD-10-CM | POA: Diagnosis present

## 2011-10-24 DIAGNOSIS — M545 Low back pain, unspecified: Secondary | ICD-10-CM | POA: Diagnosis present

## 2011-10-24 DIAGNOSIS — E876 Hypokalemia: Secondary | ICD-10-CM | POA: Diagnosis present

## 2011-10-24 DIAGNOSIS — M62838 Other muscle spasm: Secondary | ICD-10-CM | POA: Diagnosis not present

## 2011-10-24 DIAGNOSIS — F329 Major depressive disorder, single episode, unspecified: Secondary | ICD-10-CM | POA: Diagnosis present

## 2011-10-24 DIAGNOSIS — F17200 Nicotine dependence, unspecified, uncomplicated: Secondary | ICD-10-CM | POA: Diagnosis present

## 2011-10-24 DIAGNOSIS — E669 Obesity, unspecified: Secondary | ICD-10-CM | POA: Diagnosis present

## 2011-10-24 DIAGNOSIS — D72829 Elevated white blood cell count, unspecified: Secondary | ICD-10-CM | POA: Diagnosis present

## 2011-10-24 DIAGNOSIS — K219 Gastro-esophageal reflux disease without esophagitis: Secondary | ICD-10-CM | POA: Diagnosis present

## 2011-10-24 LAB — URINALYSIS, ROUTINE W REFLEX MICROSCOPIC
Bilirubin Urine: NEGATIVE
Ketones, ur: NEGATIVE mg/dL
Nitrite: NEGATIVE
pH: 6.5 (ref 5.0–8.0)

## 2011-10-24 LAB — COMPREHENSIVE METABOLIC PANEL
ALT: 12 U/L (ref 0–35)
Alkaline Phosphatase: 60 U/L (ref 39–117)
CO2: 24 mEq/L (ref 19–32)
Chloride: 107 mEq/L (ref 96–112)
GFR calc Af Amer: >90 mL/min (ref >90)
GFR calc non Af Amer: >90 mL/min (ref >90)
Glucose, Bld: 85 mg/dL (ref 70–99)
Potassium: 3.6 mEq/L (ref 3.5–5.1)
Sodium: 141 mEq/L (ref 135–145)

## 2011-10-24 LAB — DIFFERENTIAL
Basophils Absolute: 0.1 10*3/uL (ref 0.0–0.1)
Eosinophils Absolute: 0.6 10*3/uL (ref 0.0–0.7)
Lymphocytes Relative: 28 % (ref 12–46)
Neutro Abs: 5.5 10*3/uL (ref 1.7–7.7)
Neutrophils Relative %: 53 % (ref 43–77)

## 2011-10-24 LAB — CBC
HCT: 27.9 % — ABNORMAL LOW (ref 36.0–46.0)
MCHC: 34.8 g/dL (ref 30.0–36.0)
RDW: 16 % — ABNORMAL HIGH (ref 11.5–15.5)

## 2011-10-24 LAB — URINE MICROSCOPIC-ADD ON

## 2011-10-24 MED ORDER — DIPHENHYDRAMINE HCL 50 MG/ML IJ SOLN
12.5000 mg | INTRAMUSCULAR | Status: DC | PRN
  Administered 2011-10-24 – 2011-10-25 (×4): 25 mg via INTRAVENOUS
  Administered 2011-10-26: 12.5 mg via INTRAVENOUS
  Administered 2011-10-26 – 2011-10-27 (×7): 25 mg via INTRAVENOUS
  Filled 2011-10-24 (×14): qty 1

## 2011-10-24 MED ORDER — ENOXAPARIN SODIUM 40 MG/0.4ML ~~LOC~~ SOLN
40.0000 mg | SUBCUTANEOUS | Status: DC
  Filled 2011-10-24: qty 0.4

## 2011-10-24 MED ORDER — ONDANSETRON HCL 4 MG PO TABS: 4.0000 mg | ORAL_TABLET | ORAL | Status: DC | PRN

## 2011-10-24 MED ORDER — ACETAMINOPHEN 325 MG PO TABS
650.0000 mg | ORAL_TABLET | ORAL | Status: DC | PRN
  Administered 2011-10-24: 650 mg via ORAL
  Filled 2011-10-24 (×2): qty 2

## 2011-10-24 MED ORDER — ONDANSETRON HCL 4 MG/2ML IJ SOLN
4.0000 mg | INTRAMUSCULAR | Status: DC | PRN
  Administered 2011-10-24 – 2011-10-31 (×2): 4 mg via INTRAVENOUS
  Filled 2011-10-24 (×3): qty 2

## 2011-10-24 MED ORDER — DIPHENHYDRAMINE HCL 25 MG PO CAPS: 25.0000 mg | ORAL_CAPSULE | ORAL | Status: DC | PRN

## 2011-10-24 MED ORDER — DEXTROSE-NACL 5-0.45 % IV SOLN
INTRAVENOUS | Status: DC
  Administered 2011-10-25 – 2011-10-29 (×3): via INTRAVENOUS
  Administered 2011-10-30: 1000 mL via INTRAVENOUS
  Administered 2011-10-30 (×2): via INTRAVENOUS

## 2011-10-24 MED ORDER — HYDROXYUREA 500 MG PO CAPS
500.0000 mg | ORAL_CAPSULE | Freq: Two times a day (BID) | ORAL | Status: DC
  Administered 2011-10-24 – 2011-10-25 (×2): 500 mg via ORAL
  Filled 2011-10-24 (×3): qty 1

## 2011-10-24 MED ORDER — HYDROMORPHONE HCL PF 2 MG/ML IJ SOLN
2.0000 mg | INTRAMUSCULAR | Status: DC | PRN
  Administered 2011-10-24 – 2011-10-28 (×34): 2 mg via INTRAVENOUS
  Administered 2011-10-29: 4 mg via INTRAVENOUS
  Administered 2011-10-29: 2 mg via INTRAVENOUS
  Administered 2011-10-29: 4 mg via INTRAVENOUS
  Administered 2011-10-29 (×3): 2 mg via INTRAVENOUS
  Administered 2011-10-29 – 2011-10-30 (×10): 4 mg via INTRAVENOUS
  Administered 2011-10-31 – 2011-11-01 (×13): 2 mg via INTRAVENOUS
  Filled 2011-10-24 (×3): qty 1
  Filled 2011-10-24 (×2): qty 2
  Filled 2011-10-24 (×7): qty 1
  Filled 2011-10-24: qty 2
  Filled 2011-10-24 (×6): qty 1
  Filled 2011-10-24 (×2): qty 2
  Filled 2011-10-24 (×4): qty 1
  Filled 2011-10-24 (×3): qty 2
  Filled 2011-10-24 (×3): qty 1
  Filled 2011-10-24 (×2): qty 2
  Filled 2011-10-24: qty 1
  Filled 2011-10-24 (×2): qty 2
  Filled 2011-10-24 (×13): qty 1
  Filled 2011-10-24: qty 2
  Filled 2011-10-24 (×13): qty 1

## 2011-10-24 MED ORDER — FOLIC ACID 1 MG PO TABS
1.0000 mg | ORAL_TABLET | Freq: Every day | ORAL | Status: DC
  Filled 2011-10-24: qty 1

## 2011-10-24 MED ORDER — DEXTROSE-NACL 5-0.45 % IV SOLN
INTRAVENOUS | Status: DC
  Administered 2011-10-24 – 2011-11-01 (×9): via INTRAVENOUS

## 2011-10-24 MED ORDER — FOLIC ACID 1 MG PO TABS
1.0000 mg | ORAL_TABLET | Freq: Every day | ORAL | Status: DC
  Administered 2011-10-25 – 2011-10-31 (×6): 1 mg via ORAL
  Filled 2011-10-24 (×8): qty 1

## 2011-10-24 NOTE — Progress Notes (Signed)
MD notified  

## 2011-10-24 NOTE — Progress Notes (Signed)
Patient ID: [name], female   DOB: 1990/05/21, 21 y.o.   MRN: [id number] Patient was admitted to [facility] with a pain level of 6 out 10(0-10 pain scale). Patient 's pain progressed to 7 out of 10. Patient stated her pain is in her back & arms however during report from previous shift pain was in joints & wrist. Patient was transferred to inpatient bed 1339 and report given to [place], RN on [address].

## 2011-10-24 NOTE — H&P (Signed)
Family Medicine Teaching [facility] Admission History and Physical  Patient name: [name] Medical record number: [id number] Date of birth: 1990-08-06 Age: 21 y.o. Gender: female  Primary Care Provider: [name], MD, MD  Chief Complaint:   History of Present Illness: [name] is a 21 y.o. year old female presenting to [facility] with c/o pain in joints and a low grad fever . Joint pain x's 24 hrs  progressivley worsened with not relief from her home regiment  Fever improved with analgesic.  Review Of Systems: Per HPI with the following additions:  Negative fever, rash, constipation, lightheadedness, syncope, palpitations  Patient Active Problem List  Diagnoses  . SICKLE CELL ANEMIA  . TRICHOTILLOMANIA  . Active smoker  . Back pain  . Depression  . GERD (gastroesophageal reflux disease)  . Contraception management  . Stress  . Overweight  . Vaso-occlusive sickle cell crisis   Past Medical History: Past Medical History  Diagnosis Date  . H/O: 1 miscarriage 03/22/2011  . 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 Active smoker 08/09/2010  . Depression 01/06/2011  . GERD (gastroesophageal reflux disease) 02/17/2011  . Trichotillomania     h/o  . Blood transfusion   . Sickle cell anemia with crisis     Past Surgical History: Past Surgical History  Procedure Date  . Cholecystectomy 05/2010  . Dilation and curettage of uterus 02/20/11    Social History: History   Social History  . Marital Status: Single    Spouse Name: N/A    Number of Children: N/A  . Years of Education: N/A   Social History Main Topics  . Smoking status: Former Smoker    Quit date: 07/15/2011  . Smokeless tobacco: Never Used  . Alcohol Use: No  . Drug Use: No     marijuana  . Sexually Active: Yes   Other Topics Concern  . None   Social History Narrative  . None    Family History: No family history on file.  Allergies: Allergies  Allergen Reactions  . Ketorolac Tromethamine  Hives and Itching    Current Facility-Administered Medications  Medication Dose Route Frequency Provider Last Rate Last Dose  . acetaminophen (TYLENOL) tablet 650 mg  650 mg Oral Q4H PRN Gwenyth Bender, MD   650 mg at 10/24/11 1619  . dextrose 5 %-0.45 % sodium chloride infusion   Intravenous Continuous Gwenyth Bender, MD      . dextrose 5 %-0.45 % sodium chloride infusion   Intravenous Continuous Gwenyth Bender, MD      . diphenhydrAMINE (BENADRYL) capsule 25-50 mg  25-50 mg Oral Q4H PRN Gwenyth Bender, MD       Or  . diphenhydrAMINE (BENADRYL) injection 12.5-25 mg  12.5-25 mg Intravenous Q4H PRN Gwenyth Bender, MD      . enoxaparin (LOVENOX) injection 40 mg  40 mg Subcutaneous Q24H Gwenyth Bender, MD      . folic acid (FOLVITE) tablet 1 mg  1 mg Oral Daily Gwenyth Bender, MD      . folic acid (FOLVITE) tablet 1 mg  1 mg Oral Daily Gwenyth Bender, MD      . HYDROmorphone (DILAUDID) injection 2-4 mg  2-4 mg Intravenous Q2H PRN Gwenyth Bender, MD      . hydroxyurea (HYDREA) capsule 500 mg  500 mg Oral BID Gwenyth Bender, MD      . ondansetron [facility]) tablet 4 mg  4 mg Oral Q4H PRN Gwenyth Bender,  MD       Or  . ondansetron (ZOFRAN) injection 4 mg  4 mg Intravenous Q4H PRN Gwenyth Bender, MD         Physical Exam: Filed Vitals:   10/24/11 1902  BP: 114/68  Pulse: 89  Temp: 99.5 F (37.5 C)  Resp: 18   General Appearance: Alert and oriented, cooperative, well nourished, well developed, no acute distress (pain 8/10) Head: Normocephalic, atraumatic  Eyes: PERRLA, EOMI, scleral icterus  Nose: Nares, septum and mucosa are normal, no visible drainage, no sinus tenderness  Throat: Lips, mucosa, tongue, teeth and gums are normal  Neck: Trachea is midline, no lymphophadenopathy, mass or thyromegaly  Back: Symmetric, no curvature, bilateral CVA tenderness, diffuse tenderness  Resp: Diminished breath sounds bibasilar and bilaterally, CTA, no wheezes/rales/rhonchi  Cardio: Regular rate and rhythm, S1, S2 normal, soft murmur,  no click/rub/gallop  GI: Soft, distended, hypoactive bowel sounds, no organomegaly  Extremities: Atraumatic, no cyanosis or edema, Homans sign is negative, no sign of DVT  Pulses: 2+ and symmetric  Skin: Skin color, texture and turgor are normal,  Neurologic: Grossly normal, AO x 3, CN II - XII intact, no focal deficits  Psych: Flat/ appropriate affect   Labs Assessment and Plan: [name] is a 21 y.o. year old female with Hgb SS, presenting with pain crisis.  1. Sickle Cell Pain Crisis: - IVF pending and pain management labs ordered for review in AM  hydroxyurea pharmacy consult  2. DVT Prophylaxis: Lovenox- pharmacy to dose    3. Transfer to hospital for admission unable to provide a line  For hydration and pain management   Signed: Sylvester Harder, NP  [phone] 10/24/2011 7:12 PM  Meloni Hinz PAULETTE 10/24/2011 7:12 PM

## 2011-10-25 LAB — CBC
MCHC: 34.1 g/dL (ref 30.0–36.0)
Platelets: 609 10*3/uL — ABNORMAL HIGH (ref 150–400)
RDW: 16.1 % — ABNORMAL HIGH (ref 11.5–15.5)

## 2011-10-25 LAB — COMPREHENSIVE METABOLIC PANEL
ALT: 13 U/L (ref 0–35)
Albumin: 4.1 g/dL (ref 3.5–5.2)
Alkaline Phosphatase: 60 U/L (ref 39–117)
Potassium: 3.1 mEq/L — ABNORMAL LOW (ref 3.5–5.1)
Sodium: 137 mEq/L (ref 135–145)
Total Protein: 7.6 g/dL (ref 6.0–8.3)

## 2011-10-25 LAB — RETICULOCYTES: RBC.: 3.21 MIL/uL — ABNORMAL LOW (ref 3.87–5.11)

## 2011-10-25 LAB — MAGNESIUM: Magnesium: 2 mg/dL (ref 1.5–2.5)

## 2011-10-25 LAB — FERRITIN: Ferritin: 463 ng/mL — ABNORMAL HIGH (ref 10–291)

## 2011-10-25 LAB — PREPARE RBC (CROSSMATCH)

## 2011-10-25 MED ORDER — HYDROXYUREA 500 MG PO CAPS
500.0000 mg | ORAL_CAPSULE | Freq: Once | ORAL | Status: AC
  Administered 2011-10-25: 500 mg via ORAL
  Filled 2011-10-25: qty 1

## 2011-10-25 MED ORDER — VITAMIN D3 25 MCG (1000 UNIT) PO TABS
1000.0000 [IU] | ORAL_TABLET | Freq: Every day | ORAL | Status: DC
  Administered 2011-10-25 – 2011-10-31 (×7): 1000 [IU] via ORAL
  Filled 2011-10-25 (×9): qty 1

## 2011-10-25 MED ORDER — DEXTROSE 5 % IV SOLN
2000.0000 mg | Freq: Every morning | INTRAVENOUS | Status: DC
  Filled 2011-10-25: qty 2

## 2011-10-25 MED ORDER — SODIUM CHLORIDE 0.9 % IJ SOLN: 10.0000 mL | INTRAMUSCULAR | Status: DC | PRN

## 2011-10-25 MED ORDER — PANTOPRAZOLE SODIUM 40 MG PO TBEC
40.0000 mg | DELAYED_RELEASE_TABLET | Freq: Every day | ORAL | Status: DC
  Administered 2011-10-25 – 2011-10-30 (×6): 40 mg via ORAL
  Filled 2011-10-25 (×8): qty 1

## 2011-10-25 MED ORDER — ACETAMINOPHEN 325 MG PO TABS
650.0000 mg | ORAL_TABLET | Freq: Once | ORAL | Status: AC
  Administered 2011-10-25: 650 mg via ORAL

## 2011-10-25 MED ORDER — POTASSIUM CHLORIDE 10 MEQ/100ML IV SOLN
10.0000 meq | INTRAVENOUS | Status: AC
  Administered 2011-10-25 (×2): 10 meq via INTRAVENOUS
  Filled 2011-10-25 (×2): qty 100

## 2011-10-25 MED ORDER — FLUOXETINE HCL 20 MG PO TABS
20.0000 mg | ORAL_TABLET | Freq: Every day | ORAL | Status: DC
  Administered 2011-10-25 – 2011-10-31 (×7): 20 mg via ORAL
  Filled 2011-10-25 (×9): qty 1

## 2011-10-25 MED ORDER — HYDROXYUREA 500 MG PO CAPS
1000.0000 mg | ORAL_CAPSULE | Freq: Every day | ORAL | Status: DC
  Administered 2011-10-26 – 2011-10-31 (×6): 1000 mg via ORAL
  Filled 2011-10-25 (×7): qty 2

## 2011-10-25 MED ORDER — SODIUM CHLORIDE 0.9 % IJ SOLN
10.0000 mL | Freq: Two times a day (BID) | INTRAMUSCULAR | Status: DC
Start: 2011-10-25 — End: 2011-11-01
  Administered 2011-10-25 – 2011-10-30 (×11): 10 mL

## 2011-10-25 MED ORDER — IBUPROFEN 600 MG PO TABS
600.0000 mg | ORAL_TABLET | Freq: Every day | ORAL | Status: DC
  Administered 2011-10-25 – 2011-10-28 (×4): 600 mg via ORAL
  Filled 2011-10-25 (×4): qty 1

## 2011-10-25 NOTE — Progress Notes (Signed)
Nutrition Brief Note  - Pt screened for unintended weight loss greater than 10 pounds in the past month - however pt reports she has been intentionally trying to lose weight and does report a 10 pound intended weight loss in the past month. Pt states she has been walking more and reducing her portion sizes. Unclear why pt on diabetic diet as pt does not report having diabetes and it is not in her H&P - however pt reports eating well on diabetic diet. No nutrition diagnosis at this time. Will continue to monitor intake.   Dietitian# [phone]

## 2011-10-25 NOTE — Progress Notes (Signed)
Peripherally Inserted Central Catheter/Midline Placement  The IV Nurse has discussed with the patient and/or persons authorized to consent for the patient, the purpose of this procedure and the potential benefits and risks involved with this procedure.  The benefits include less needle sticks, lab draws from the catheter and patient may be discharged home with the catheter.  Risks include, but not limited to, infection, bleeding, blood clot (thrombus formation), and puncture of an artery; nerve damage and irregular heat beat.  Alternatives to this procedure were also discussed.  PICC/Midline Placement Documentation  PICC / Midline Double Lumen 10/25/11 PICC Left Basilic (Active)       [name], [name] 10/25/2011, 8:27 PM

## 2011-10-25 NOTE — Progress Notes (Signed)
IV team contacted regarding PICC placement and then need for blood exchange.  Pt aware and agreeable.

## 2011-10-25 NOTE — Progress Notes (Signed)
PROGRESS NOTE  [name]:[id number] DOB: 09/01/1990 DOA: 10/24/2011 PCP: [name], MD, MD  Brief narrative: [name] is a 21 y.o. year old female presenting with joint and musle pain wrist and knees are slightly swollen and warm to touch and point tenderness in the L upper perispinal region.  Assessment/Plan: Active Problems:  Sickle cell Crisis- IVF hydration, pain management , active hemolysis , Exchange will be ordered when Picc line is available  Hypokalemia- K+ 3.1 2 runs of potassium  Leukocytosis - elevated WBC and low grade temp r/o infection chest x-ray, u/a if temp > 101 blood cultures  Elevated Ferritin IV  - desferal  Code Status: Full Family Communication: None Disposition Plan:  Discharge to home when Stable   Medical Consultants:  None  Other consultants:  None  Antibiotics:  Doxycycline bid   Subjective  [name] states pain is 8/10 hurts in joins and left side. Asked if taking home med appropriately stated yes unable to control pain with home med.  Discussed porta cath option will provide information to read at her leisure.    Objective    Interim History: Stable over night   Objective: Filed Vitals:   10/24/11 2322 10/25/11 0210 10/25/11 0615 10/25/11 1004  BP: 133/91 120/79 106/69 109/71  Pulse: 107 104 84 94  Temp: 98.2 F (36.8 C) 98.3 F (36.8 C) 98.2 F (36.8 C) 98.5 F (36.9 C)  TempSrc: Oral Oral Oral   Resp: 20 20 16 18   Height: 5' 3.5" (1.613 m)     Weight: 83 kg (182 lb 15.7 oz)  83 kg (182 lb 15.7 oz)   SpO2: 98% 97% 97% 98%    Intake/Output Summary (Last 24 hours) at 10/25/11 1028 Last data filed at 10/25/11 0600  Gross per 24 hour  Intake    935 ml  Output    250 ml  Net    685 ml    Exam:  General Appearance: Alert and oriented, cooperative, well nourished, well developed, mild distress  Head: Normocephalic, atraumatic  Eyes: PERRLA, EOMI, scleral icterus  Nose: Nares, septum and mucosa are  normal, no visible drainage, no sinus tenderness  Throat: Lips, mucosa, tongue, teeth and gums are normal  Neck: Trachea is midline, no lymphophadenopathy, mass or thyromegaly  Back: Symmetric, no curvature, bilateral CVA tenderness, diffuse tenderness  Resp: Diminished breath sounds bibasilar and bilaterally, CTA, no wheezes/rales/rhonchi  Cardio: Regular rate and rhythm, S1, S2 normal, soft murmur, no click/rub/gallop  GI: Soft, distended, hypoactive bowel sounds, no organomegaly  Extremities: Atraumatic, no cyanosis, Homans sign is negative, wrist ankles and knees slightly swollen / warm  Pulses: 2+ and symmetric  Skin: Skin color, texture and turgor are normal  Neurologic: CN II - XII intact Psych: Flat affect     Data Reviewed: Basic Metabolic Panel:  Lab 10/25/11 9562 10/24/11 1611  NA 137 141  K 3.1* 3.6  CL 102 107  CO2 23 24  GLUCOSE 103* 85  BUN 6 6  CREATININE 0.60 0.55  CALCIUM 9.0 9.0  MG 2.0 --  PHOS 4.2 --   GFR Estimated Creatinine Clearance: 116.8 ml/min (by C-G formula based on Cr of 0.6). Liver Function Tests:  Lab 10/25/11 0337 10/24/11 1611  AST 21 18  ALT 13 12  ALKPHOS 60 60  BILITOT 1.9* 1.8*  PROT 7.6 7.3  ALBUMIN 4.1 4.1   No results found for this basename: LIPASE:5,AMYLASE:5 in the last 168 hours No results found for this basename:  AMMONIA:5 in the last 168 hours Coagulation profile No results found for this basename: INR:5,PROTIME:5 in the last 168 hours  CBC:  Lab 10/25/11 0337 10/24/11 1611  WBC 14.3* 10.3  NEUTROABS -- 5.5  HGB 9.8* 9.7*  HCT 28.7* 27.9*  MCV 88.9 88.3  PLT 609* 611*   Cardiac Enzymes: No results found for this basename: CKTOTAL:5,CKMB:5,CKMBINDEX:5,TROPONINI:5 in the last 168 hours BNP: No components found with this basename: POCBNP:5 CBG: No results found for this basename: GLUCAP:5 in the last 168 hours D-Dimer No results found for this basename: DDIMER:2 in the last 72 hours Hgb A1c No results found  for this basename: HGBA1C:2 in the last 72 hours Lipid Profile No results found for this basename: CHOL:2,HDL:2,LDLCALC:2,TRIG:2,CHOLHDL:2,LDLDIRECT:2 in the last 72 hours Thyroid function studies No results found for this basename: TSH,T4TOTAL,FREET3,T3FREE,THYROIDAB in the last 72 hours Anemia work up  [employer] 10/25/11 0300  VITAMINB12 --  FOLATE --  FERRITIN --  TIBC --  IRON --  RETICCTPCT 7.9*   Microbiology No results found for this or any previous visit (from the past 240 hour(s)).  Procedures and Diagnostic Studies: Dg Chest 2 View  10/06/2011  *RADIOLOGY REPORT*  Clinical Data: Of breath.  History of sickle cell disease.  CHEST - 2 VIEW  Comparison: Two-view chest x-ray 07/14/2011, 06/09/2011, 05/19/2011, 10/20/2010.  Findings: Cardiomediastinal silhouette unremarkable, unchanged. Linear scarring in the right upper lobe, unchanged.  No new pulmonary parenchymal abnormalities.  No pleural effusions. 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  No significant interval change.  IMPRESSION: No acute cardiopulmonary disease.  Linear scarring in the right upper lobe.  Stable examination.  Original Report Authenticated By: Arnell Sieving, M.D.    Scheduled Meds:    . folic acid  1 mg Oral Daily  . hydroxyurea  500 mg Oral BID  . DISCONTD: enoxaparin  40 mg Subcutaneous Q24H  . DISCONTD: folic acid  1 mg Oral Daily   Continuous Infusions:    . dextrose 5 % and 0.45% NaCl 125 mL/hr at 10/24/11 2016  . dextrose 5 % and 0.45% NaCl 100 mL/hr at 10/25/11 0527      LOS: 1 day   Gwinda Passe, NP  10/25/2011, 10:28 AM

## 2011-10-26 LAB — COMPREHENSIVE METABOLIC PANEL
ALT: 13 U/L (ref 0–35)
AST: 24 U/L (ref 0–37)
Albumin: 3.7 g/dL (ref 3.5–5.2)
Alkaline Phosphatase: 55 U/L (ref 39–117)
CO2: 24 mEq/L (ref 19–32)
Chloride: 104 mEq/L (ref 96–112)
Creatinine, Ser: 0.54 mg/dL (ref 0.50–1.10)
GFR calc non Af Amer: >90 mL/min (ref >90)
Potassium: 3.5 mEq/L (ref 3.5–5.1)
Sodium: 136 mEq/L (ref 135–145)
Total Bilirubin: 2.3 mg/dL — ABNORMAL HIGH (ref 0.3–1.2)

## 2011-10-26 LAB — PHOSPHORUS: Phosphorus: 4.2 mg/dL (ref 2.3–4.6)

## 2011-10-26 LAB — CBC
Platelets: 508 10*3/uL — ABNORMAL HIGH (ref 150–400)
RBC: 2.94 MIL/uL — ABNORMAL LOW (ref 3.87–5.11)
RDW: 16.4 % — ABNORMAL HIGH (ref 11.5–15.5)
WBC: 13.7 10*3/uL — ABNORMAL HIGH (ref 4.0–10.5)

## 2011-10-26 MED ORDER — DEXTROSE 5 % IV SOLN
2000.0000 mg | INTRAVENOUS | Status: DC
  Administered 2011-10-26 – 2011-10-30 (×5): 2000 mg via INTRAVENOUS
  Filled 2011-10-26 (×7): qty 2

## 2011-10-26 MED ORDER — FLUVOXAMINE MALEATE 50 MG PO TABS
25.0000 mg | ORAL_TABLET | Freq: Every day | ORAL | Status: DC
  Administered 2011-10-26 – 2011-10-31 (×6): 25 mg via ORAL
  Filled 2011-10-26 (×7): qty 1

## 2011-10-26 MED ORDER — POTASSIUM CHLORIDE CRYS ER 10 MEQ PO TBCR
10.0000 meq | EXTENDED_RELEASE_TABLET | Freq: Every day | ORAL | Status: DC
  Administered 2011-10-26 – 2011-10-31 (×6): 10 meq via ORAL
  Filled 2011-10-26 (×7): qty 1

## 2011-10-26 NOTE — Progress Notes (Signed)
PROGRESS NOTE  [name]:[id number] DOB: 09-10-1990 DOA: 10/24/2011 PCP: [name], MD  Brief narrative: [name] is a 21 y.o. year old female presenting with joint and musle pain wrist and knees are slightly swollen and warm to touch and point tenderness in the L upper perispinal region.  Assessment/Plan: Active Problems:  Sickle cell Crisis- IVF hydration, pain management , active hemolysis ,  Hypokalemia- K+ 3.1 2 runs of potassium complete WNL Leukocytosis - elevated WBCElevated Ferritin IV  - desferal  Code Status: Full Family Communication: None Disposition Plan:  Discharge to home when Stable   Medical Consultants:  None  Other consultants:  None  Antibiotics:  Doxycycline bid   Subjective  [name] states pain is 7/10 hurts in joins and left side.  Awaiting exchange Picc line place yesterday   Objective    Interim History: Stable over night   Objective: Filed Vitals:   10/26/11 0050 10/26/11 0145 10/26/11 0554 10/26/11 0604  BP: 116/77 120/81 118/78 106/64  Pulse: 89 93 88 87  Temp: 98.6 F (37 C) 98.8 F (37.1 C) 98.1 F (36.7 C) 98.5 F (36.9 C)  TempSrc: Oral Oral Oral Oral  Resp: 18 18 16 16   Height:      Weight:   79.788 kg (175 lb 14.4 oz)   SpO2:   94% 100%    Intake/Output Summary (Last 24 hours) at 10/26/11 1040 Last data filed at 10/26/11 0555  Gross per 24 hour  Intake 1804.5 ml  Output   1000 ml  Net  804.5 ml    Exam:  General Appearance: Alert and oriented, cooperative, well nourished, well developed,no acute distress  Head: Normocephalic, atraumatic  Eyes: PERRLA, EOMI, scleral icterus  Nose: Nares, septum and mucosa are normal, no visible drainage, no sinus tenderness  Throat: Lips, mucosa, tongue, teeth good repairNeck: Trachea is midline, no lymphophadenopathy, mass or thyromegaly  Back: Symmetric, no curvature, bilateral CVA tenderness, diffuse tenderness  Resp: Diminished breath sounds bibasilar and  bilaterally, CTA, no wheezes/rales/rhonchi  Cardio: Regular rate and rhythm, S1, S2 normal, soft murmur, no click/rub/gallop  GI: Soft, distended,  bowel sounds present , no organomegaly  Extremities: Atraumatic, no cyanosis, Homans sign is negative, wrist ankles and knees slightly swollen / warm  Pulses: 2+ and symmetric  Skin: Skin color, texture and turgor are normal  Neurologic: CN II - XII intact Psych: Appropriate affect     Data Reviewed: Basic Metabolic Panel:  Lab 10/26/11 9562 10/25/11 0337 10/24/11 1611  NA 136 137 141  K 3.5 3.1* --  CL 104 102 107  CO2 24 23 24   GLUCOSE 94 103* 85  BUN 5* 6 6  CREATININE 0.54 0.60 0.55  CALCIUM 8.6 9.0 9.0  MG -- 2.0 --  PHOS 4.2 4.2 --   GFR Estimated Creatinine Clearance: 114.5 ml/min (by C-G formula based on Cr of 0.54). Liver Function Tests:  Lab 10/26/11 0720 10/25/11 0337 10/24/11 1611  AST 24 21 18   ALT 13 13 12   ALKPHOS 55 60 60  BILITOT 2.3* 1.9* 1.8*  PROT 6.6 7.6 7.3  ALBUMIN 3.7 4.1 4.1   No results found for this basename: LIPASE:5,AMYLASE:5 in the last 168 hours No results found for this basename: AMMONIA:5 in the last 168 hours Coagulation profile No results found for this basename: INR:5,PROTIME:5 in the last 168 hours  CBC:  Lab 10/26/11 0720 10/25/11 0337 10/24/11 1611  WBC 13.7* 14.3* 10.3  NEUTROABS -- -- 5.5  HGB 9.1* 9.8*  9.7*  HCT 25.5* 28.7* 27.9*  MCV 86.7 88.9 88.3  PLT 508* 609* 611*   Cardiac Enzymes: No results found for this basename: CKTOTAL:5,CKMB:5,CKMBINDEX:5,TROPONINI:5 in the last 168 hours BNP: No components found with this basename: POCBNP:5 CBG: No results found for this basename: GLUCAP:5 in the last 168 hours D-Dimer No results found for this basename: DDIMER:2 in the last 72 hours Hgb A1c No results found for this basename: HGBA1C:2 in the last 72 hours Lipid Profile No results found for this basename: CHOL:2,HDL:2,LDLCALC:2,TRIG:2,CHOLHDL:2,LDLDIRECT:2 in the last  72 hours Thyroid function studies No results found for this basename: TSH,T4TOTAL,FREET3,T3FREE,THYROIDAB in the last 72 hours Anemia work up  [employer] 10/25/11 0337 10/25/11 0300  VITAMINB12 -- --  FOLATE -- --  FERRITIN 463* --  TIBC -- --  IRON -- --  RETICCTPCT -- 7.9*   Microbiology No results found for this or any previous visit (from the past 240 hour(s)).  Procedures and Diagnostic Studies: Dg Chest 2 View  10/06/2011  *RADIOLOGY REPORT*  Clinical Data: Of breath.  History of sickle cell disease.  CHEST - 2 VIEW  Comparison: Two-view chest x-ray 07/14/2011, 06/09/2011, 05/19/2011, 10/20/2010.  Findings: Cardiomediastinal silhouette unremarkable, unchanged. Linear scarring in the right upper lobe, unchanged.  No new pulmonary parenchymal abnormalities.  No pleural effusions. 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  No significant interval change.  IMPRESSION: No acute cardiopulmonary disease.  Linear scarring in the right upper lobe.  Stable examination.  Original Report Authenticated By: Arnell Sieving, M.D.    Scheduled Meds:    . acetaminophen  650 mg Oral Once  . cholecalciferol  1,000 Units Oral Daily  . deferoxamine (DESFERAL) IV  2,000 mg Intravenous q morning - 10a  . FLUoxetine  20 mg Oral QHS  . folic acid  1 mg Oral Daily  . hydroxyurea  1,000 mg Oral Daily  . hydroxyurea  500 mg Oral Once  . ibuprofen  600 mg Oral Daily  . pantoprazole  40 mg Oral Q1200  . potassium chloride  10 mEq Intravenous Q1 Hr x 2  . sodium chloride  10-40 mL Intracatheter Q12H  . DISCONTD: hydroxyurea  500 mg Oral BID   Continuous Infusions:    . dextrose 5 % and 0.45% NaCl 125 mL/hr at 10/26/11 0352  . dextrose 5 % and 0.45% NaCl 100 mL/hr at 10/25/11 0527      LOS: 2 days   Gwinda Passe, NP  [phone] 10/26/2011, 10:40 AM

## 2011-10-27 LAB — COMPREHENSIVE METABOLIC PANEL
ALT: 13 U/L (ref 0–35)
AST: 22 U/L (ref 0–37)
CO2: 26 mEq/L (ref 19–32)
Chloride: 104 mEq/L (ref 96–112)
Creatinine, Ser: 0.58 mg/dL (ref 0.50–1.10)
GFR calc Af Amer: >90 mL/min (ref >90)
GFR calc non Af Amer: >90 mL/min (ref >90)
Glucose, Bld: 111 mg/dL — ABNORMAL HIGH (ref 70–99)
Total Bilirubin: 1.8 mg/dL — ABNORMAL HIGH (ref 0.3–1.2)

## 2011-10-27 LAB — CBC
Hemoglobin: 8.8 g/dL — ABNORMAL LOW (ref 12.0–15.0)
MCV: 88.6 fL (ref 78.0–100.0)
Platelets: 499 10*3/uL — ABNORMAL HIGH (ref 150–400)
RBC: 2.81 MIL/uL — ABNORMAL LOW (ref 3.87–5.11)
WBC: 15.1 10*3/uL — ABNORMAL HIGH (ref 4.0–10.5)

## 2011-10-27 LAB — HEMOGLOBINOPATHY EVALUATION
Hemoglobin Other: 0 %
Hgb A: 0 % — ABNORMAL LOW (ref 96.8–97.8)
Hgb F Quant: 19.2 % — ABNORMAL HIGH (ref 0.0–2.0)
Hgb S Quant: 77.9 % — ABNORMAL HIGH

## 2011-10-27 MED ORDER — PROMETHAZINE HCL 25 MG/ML IJ SOLN
12.5000 mg | Freq: Four times a day (QID) | INTRAMUSCULAR | Status: DC | PRN
  Administered 2011-10-27 – 2011-10-28 (×2): 12.5 mg via INTRAVENOUS
  Administered 2011-10-28: 25 mg via INTRAVENOUS
  Filled 2011-10-27 (×3): qty 1

## 2011-10-27 MED ORDER — ALTEPLASE 100 MG IV SOLR
2.0000 mg | Freq: Once | INTRAVENOUS | Status: DC
  Filled 2011-10-27: qty 2

## 2011-10-27 NOTE — Progress Notes (Signed)
Per IV team, pt will be unable to get blood exchange likely on this shift. Pt made aware of this.

## 2011-10-27 NOTE — Progress Notes (Signed)
PROGRESS NOTE  [name] RUE:[id number] DOB: Jan 13, 1991 DOA: 10/24/2011 PCP: [name], MD  Brief narrative: [name] is a 21 y.o. year old female presenting with joint and musle pain wrist and knees are slightly swollen and warm to touch and point tenderness in the L upper perispinal region.  Assessment/Plan: Active Problems:  Sickle cell Crisis- IVF hydration, pain management , active hemolysis ,  Hypokalemia- K+ 3.1 2 runs of potassium complete WNL Leukocytosis - elevated WBCElevated Ferritin IV  - desferal Back pain- k pad cont current medication   Code Status: Full Family Communication: None Disposition Plan:  Discharge to home when Stable   Medical Consultants:  None  Other consultants:  None  Antibiotics:  Doxycycline bid   Subjective  [name] states pain is 7/10 she continues to hurts in joins and left side. Pain more prominent  in back aching. Exchange in progress.Only able to remove 200cc before transfusion took place.   Objective    Interim History: Stable over night   Objective: Filed Vitals:   10/27/11 1000 10/27/11 1439 10/27/11 1522 10/27/11 1537  BP: 114/76 125/83 102/65 122/69  Pulse: 97 103 102 104  Temp: 98.6 F (37 C) 98.8 F (37.1 C) 98.9 F (37.2 C) 98.2 F (36.8 C)  TempSrc:   Oral Oral  Resp: 18 20 20 20   Height:      Weight:      SpO2: 96% 97%      Intake/Output Summary (Last 24 hours) at 10/27/11 1633 Last data filed at 10/27/11 1300  Gross per 24 hour  Intake   2855 ml  Output    200 ml  Net   2655 ml    Exam:  General Appearance: Alert and oriented, cooperative, well nourished, well developed, appears not to be in  acute distress  Head: Normocephalic, atraumatic  Eyes: PERRLA, EOMI, scleral icterus  Nose: Nares, septum and mucosa are normal, no visible drainage, no sinus tenderness  Throat: Lips, mucosa, tongue, teeth good repairNeck: Trachea is midline, no lymphophadenopathy, mass or thyromegaly  Back:  Symmetric, no curvature, bilateral CVA tenderness, diffuse tenderness  Resp: Diminished breath sounds bibasilar and bilaterally, CTA, no wheezes/rales/rhonchi  Cardio: Regular rate and rhythm, S1, S2 normal, soft murmur, no click/rub/gallop  GI: Soft, distended,  bowel sounds present , no organomegaly  Extremities: Atraumatic, no cyanosis, Homans sign is negative, wrist ankles and knees slightly swollen / warm  Pulses: 2+ and symmetric  Skin: Skin color, texture and turgor are normal  Neurologic: CN II - XII intact Psych: Appropriate affect     Data Reviewed: Basic Metabolic Panel:  Lab 10/27/11 1478 10/26/11 0720 10/25/11 0337 10/24/11 1611  NA 137 136 137 141  K 3.8 3.5 -- --  CL 104 104 102 107  CO2 26 24 23 24   GLUCOSE 111* 94 103* 85  BUN 5* 5* 6 6  CREATININE 0.58 0.54 0.60 0.55  CALCIUM 8.7 8.6 9.0 9.0  MG -- -- 2.0 --  PHOS -- 4.2 4.2 --   GFR Estimated Creatinine Clearance: 114.6 ml/min (by C-G formula based on Cr of 0.58). Liver Function Tests:  Lab 10/27/11 1100 10/26/11 0720 10/25/11 0337 10/24/11 1611  AST 22 24 21 18   ALT 13 13 13 12   ALKPHOS 62 55 60 60  BILITOT 1.8* 2.3* 1.9* 1.8*  PROT 6.5 6.6 7.6 7.3  ALBUMIN 3.6 3.7 4.1 4.1   No results found for this basename: LIPASE:5,AMYLASE:5 in the last 168 hours No results found  for this basename: AMMONIA:5 in the last 168 hours Coagulation profile No results found for this basename: INR:5,PROTIME:5 in the last 168 hours  CBC:  Lab 10/27/11 1100 10/26/11 0720 10/25/11 0337 10/24/11 1611  WBC 15.1* 13.7* 14.3* 10.3  NEUTROABS -- -- -- 5.5  HGB 8.8* 9.1* 9.8* 9.7*  HCT 24.9* 25.5* 28.7* 27.9*  MCV 88.6 86.7 88.9 88.3  PLT 499* 508* 609* 611*   Cardiac Enzymes: No results found for this basename: CKTOTAL:5,CKMB:5,CKMBINDEX:5,TROPONINI:5 in the last 168 hours BNP: No components found with this basename: POCBNP:5 CBG: No results found for this basename: GLUCAP:5 in the last 168 hours D-Dimer No results  found for this basename: DDIMER:2 in the last 72 hours Hgb A1c No results found for this basename: HGBA1C:2 in the last 72 hours Lipid Profile No results found for this basename: CHOL:2,HDL:2,LDLCALC:2,TRIG:2,CHOLHDL:2,LDLDIRECT:2 in the last 72 hours Thyroid function studies No results found for this basename: TSH,T4TOTAL,FREET3,T3FREE,THYROIDAB in the last 72 hours Anemia work up  [employer] 10/26/11 0720 10/25/11 0337 10/25/11 0300  VITAMINB12 -- -- --  FOLATE -- -- --  FERRITIN 444* 463* --  TIBC -- -- --  IRON -- -- --  RETICCTPCT -- -- 7.9*   Microbiology No results found for this or any previous visit (from the past 240 hour(s)).  Procedures and Diagnostic Studies: Dg Chest 2 View  10/06/2011  *RADIOLOGY REPORT*  Clinical Data: Of breath.  History of sickle cell disease.  CHEST - 2 VIEW  Comparison: Two-view chest x-ray 07/14/2011, 06/09/2011, 05/19/2011, 10/20/2010.  Findings: Cardiomediastinal silhouette unremarkable, unchanged. Linear scarring in the right upper lobe, unchanged.  No new pulmonary parenchymal abnormalities.  No pleural effusions. 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  No significant interval change.  IMPRESSION: No acute cardiopulmonary disease.  Linear scarring in the right upper lobe.  Stable examination.  Original Report Authenticated By: Arnell Sieving, M.D.    Scheduled Meds:    . cholecalciferol  1,000 Units Oral Daily  . deferoxamine (DESFERAL) IV  2,000 mg Intravenous Q24H  . FLUoxetine  20 mg Oral QHS  . fluvoxaMINE  25 mg Oral QHS  . folic acid  1 mg Oral Daily  . hydroxyurea  1,000 mg Oral Daily  . ibuprofen  600 mg Oral Daily  . pantoprazole  40 mg Oral Q1200  . potassium chloride  10 mEq Oral Daily  . sodium chloride  10-40 mL Intracatheter Q12H   Continuous Infusions:    . dextrose 5 % and 0.45% NaCl Stopped (10/27/11 1522)  . dextrose 5 % and 0.45% NaCl 100 mL/hr at 10/25/11 0527      LOS: 3 days    Gwinda Passe, NP  [phone] 10/27/2011, 4:33 PM

## 2011-10-28 LAB — CBC
MCH: 31.2 pg (ref 26.0–34.0)
MCHC: 34.9 g/dL (ref 30.0–36.0)
Platelets: 489 10*3/uL — ABNORMAL HIGH (ref 150–400)
RBC: 3.11 MIL/uL — ABNORMAL LOW (ref 3.87–5.11)

## 2011-10-28 LAB — COMPREHENSIVE METABOLIC PANEL
ALT: 14 U/L (ref 0–35)
Alkaline Phosphatase: 57 U/L (ref 39–117)
CO2: 25 mEq/L (ref 19–32)
Chloride: 104 mEq/L (ref 96–112)
GFR calc Af Amer: >90 mL/min (ref >90)
GFR calc non Af Amer: >90 mL/min (ref >90)
Glucose, Bld: 109 mg/dL — ABNORMAL HIGH (ref 70–99)
Potassium: 4.1 mEq/L (ref 3.5–5.1)
Sodium: 137 mEq/L (ref 135–145)

## 2011-10-28 MED ORDER — IBUPROFEN 600 MG PO TABS
600.0000 mg | ORAL_TABLET | Freq: Three times a day (TID) | ORAL | Status: DC
  Administered 2011-10-28 – 2011-10-31 (×9): 600 mg via ORAL
  Filled 2011-10-28 (×14): qty 1

## 2011-10-28 MED ORDER — LORAZEPAM 2 MG/ML IJ SOLN
INTRAMUSCULAR | Status: AC
  Administered 2011-10-28: 1 mg via INTRAVENOUS
  Filled 2011-10-28: qty 1

## 2011-10-28 MED ORDER — LORAZEPAM 2 MG/ML IJ SOLN
1.0000 mg | Freq: Once | INTRAMUSCULAR | Status: AC
  Administered 2011-10-28: 1 mg via INTRAVENOUS

## 2011-10-28 NOTE — Progress Notes (Signed)
Subjective:  Patient reports she's feeling somewhat better today. She continues to have pain in her lower back and shoulders and hands. She rates her pain as a 7-8/10. She's not able to manage her pain at home as of yet. She is ambulating more as well. No other new complaints.   Allergies  Allergen Reactions  . Ketorolac Tromethamine Hives and Itching   Current Facility-Administered Medications  Medication Dose Route Frequency Provider Last Rate Last Dose  . acetaminophen (TYLENOL) tablet 650 mg  650 mg Oral Q4H PRN Gwenyth Bender, MD   650 mg at 10/24/11 1619  . alteplase (ACTIVASE) injection 2 mg  2 mg Intracatheter Once Gwenyth Bender, MD      . cholecalciferol (VITAMIN D) tablet 1,000 Units  1,000 Units Oral Daily Gwenyth Bender, MD   1,000 Units at 10/28/11 0934  . deferoxamine (DESFERAL) 2,000 mg in dextrose 5 % 500 mL infusion  2,000 mg Intravenous Q24H Gwenyth Bender, MD 125 mL/hr at 10/27/11 2043 2,000 mg at 10/27/11 2043  . dextrose 5 %-0.45 % sodium chloride infusion   Intravenous Continuous Gwenyth Bender, MD 125 mL/hr at 10/28/11 0934    . dextrose 5 %-0.45 % sodium chloride infusion   Intravenous Continuous Gwenyth Bender, MD 100 mL/hr at 10/25/11 0527    . diphenhydrAMINE (BENADRYL) capsule 25-50 mg  25-50 mg Oral Q4H PRN Gwenyth Bender, MD       Or  . diphenhydrAMINE (BENADRYL) injection 12.5-25 mg  12.5-25 mg Intravenous Q4H PRN Gwenyth Bender, MD   25 mg at 10/27/11 2043  . FLUoxetine (PROZAC) tablet 20 mg  20 mg Oral QHS Gwenyth Bender, MD   20 mg at 10/27/11 2324  . fluvoxaMINE (LUVOX) tablet 25 mg  25 mg Oral QHS Gwenyth Bender, MD   25 mg at 10/27/11 2325  . folic acid (FOLVITE) tablet 1 mg  1 mg Oral Daily Gwenyth Bender, MD   1 mg at 10/28/11 0935  . HYDROmorphone (DILAUDID) injection 2-4 mg  2-4 mg Intravenous Q2H PRN Gwenyth Bender, MD   2 mg at 10/28/11 1142  . hydroxyurea (HYDREA) capsule 1,000 mg  1,000 mg Oral Daily Berkley Harvey, PHARMD   1,000 mg at 10/28/11 0933  . ibuprofen  (ADVIL,MOTRIN) tablet 600 mg  600 mg Oral Daily Gwenyth Bender, MD   600 mg at 10/28/11 0934  . ondansetron (ZOFRAN) tablet 4 mg  4 mg Oral Q4H PRN Gwenyth Bender, MD       Or  . ondansetron [facility]) injection 4 mg  4 mg Intravenous Q4H PRN Gwenyth Bender, MD   4 mg at 10/24/11 2038  . pantoprazole (PROTONIX) EC tablet 40 mg  40 mg Oral Q1200 Gwenyth Bender, MD   40 mg at 10/28/11 1113  . potassium chloride (K-DUR,KLOR-CON) CR tablet 10 mEq  10 mEq Oral Daily Gwenyth Bender, MD   10 mEq at 10/28/11 0934  . promethazine (PHENERGAN) injection 12.5-25 mg  12.5-25 mg Intravenous Q6H PRN Gwenyth Bender, MD   12.5 mg at 10/28/11 0640  . sodium chloride 0.9 % injection 10-40 mL  10-40 mL Intracatheter Q12H Gwenyth Bender, MD   10 mL at 10/28/11 0936  . sodium chloride 0.9 % injection 10-40 mL  10-40 mL Intracatheter PRN Gwenyth Bender, MD        Objective: Blood pressure 110/75, pulse 87, temperature 97.8 F (36.6 C), temperature source  Oral, resp. rate 16, height 5' 3.5" (1.613 m), weight 176 lb 6.4 oz (80.015 kg), last menstrual period 10/17/2011, SpO2 99.00%, not currently breastfeeding.  Well developed well-nourished black female in mild distress. HEENT: No sinus tenderness. Minimal sclera icterus. NECK: No enlarged thyroid. No posterior cervical nodes. LUNGS: Clear to auscultation. No vocal fremitus. CV: Normal S1, S2 without S3. ABD: No epigastric tenderness. MSK: Tenderness in lower lumbar sacral spine. Mild tenderness in the left wrist without swelling warmth. NEURO: Intact.  Lab results: Results for orders placed during the hospital encounter of 10/24/11 (from the past 48 hour(s))  CBC     Status: Abnormal   Collection Time   10/27/11 11:00 AM      Component Value Range Comment   WBC 15.1 (*) 4.0 - 10.5 K/uL    RBC 2.81 (*) 3.87 - 5.11 MIL/uL    Hemoglobin 8.8 (*) 12.0 - 15.0 g/dL    HCT 40.9 (*) 81.1 - 46.0 %    MCV 88.6  78.0 - 100.0 fL    MCH 31.3  26.0 - 34.0 pg    MCHC 35.3  30.0 - 36.0 g/dL     RDW 91.4 (*) 78.2 - 15.5 %    Platelets 499 (*) 150 - 400 K/uL   COMPREHENSIVE METABOLIC PANEL     Status: Abnormal   Collection Time   10/27/11 11:00 AM      Component Value Range Comment   Sodium 137  135 - 145 mEq/L    Potassium 3.8  3.5 - 5.1 mEq/L    Chloride 104  96 - 112 mEq/L    CO2 26  19 - 32 mEq/L    Glucose, Bld 111 (*) 70 - 99 mg/dL    BUN 5 (*) 6 - 23 mg/dL    Creatinine, Ser 9.56  0.50 - 1.10 mg/dL    Calcium 8.7  8.4 - 21.3 mg/dL    Total Protein 6.5  6.0 - 8.3 g/dL    Albumin 3.6  3.5 - 5.2 g/dL    AST 22  0 - 37 U/L    ALT 13  0 - 35 U/L    Alkaline Phosphatase 62  39 - 117 U/L    Total Bilirubin 1.8 (*) 0.3 - 1.2 mg/dL    GFR calc non Af Amer >90  >90 mL/min    GFR calc Af Amer >90  >90 mL/min   CBC     Status: Abnormal   Collection Time   10/28/11  6:50 AM      Component Value Range Comment   WBC 15.4 (*) 4.0 - 10.5 K/uL    RBC 3.11 (*) 3.87 - 5.11 MIL/uL    Hemoglobin 9.7 (*) 12.0 - 15.0 g/dL    HCT 08.6 (*) 57.8 - 46.0 %    MCV 89.4  78.0 - 100.0 fL    MCH 31.2  26.0 - 34.0 pg    MCHC 34.9  30.0 - 36.0 g/dL    RDW 46.9 (*) 62.9 - 15.5 %    Platelets 489 (*) 150 - 400 K/uL   COMPREHENSIVE METABOLIC PANEL     Status: Abnormal   Collection Time   10/28/11  6:50 AM      Component Value Range Comment   Sodium 137  135 - 145 mEq/L    Potassium 4.1  3.5 - 5.1 mEq/L    Chloride 104  96 - 112 mEq/L    CO2 25  19 - 32 mEq/L  Glucose, Bld 109 (*) 70 - 99 mg/dL    BUN 8  6 - 23 mg/dL    Creatinine, Ser 5.32  0.50 - 1.10 mg/dL    Calcium 8.6  8.4 - 99.2 mg/dL    Total Protein 6.4  6.0 - 8.3 g/dL    Albumin 3.5  3.5 - 5.2 g/dL    AST 22  0 - 37 U/L    ALT 14  0 - 35 U/L    Alkaline Phosphatase 57  39 - 117 U/L    Total Bilirubin 2.0 (*) 0.3 - 1.2 mg/dL    GFR calc non Af Amer >90  >90 mL/min    GFR calc Af Amer >90  >90 mL/min     Studies/Results: No results found.  Patient Active Problem List  Diagnosis  . SICKLE CELL ANEMIA  . TRICHOTILLOMANIA    . Active smoker  . Back pain  . Depression  . GERD (gastroesophageal reflux disease)  . Contraception management  . Stress  . Overweight  . Vaso-occlusive sickle cell crisis    Impression: Sickle cell crisis. Slowly resolving. There was a mild increase in bilirubin. Diffuse arthralgias secondary 1. Low back pain. Gastroesophageal reflux disease stable Depression.   Plan: Increase ibuprofen to 3 times a day. Gradually increase activity as tolerated. Anticipate discharge home within the next 24-48 hours. Followup CBC, CMET for persistent signs or resolving hemolysis.   August Saucer, Lanore Renderos 10/28/2011 1:38 PM

## 2011-10-28 NOTE — Progress Notes (Signed)
Patient with c/o excruciating back pain and joint pain.  Pain unrelieved with Dilaudid 4mg  IV.  Patient crying and writhing in pain in the bed.  Dr. August Saucer notified and orders given.  Allayne Butcher [facility] 10/28/2011  4:55 PM

## 2011-10-29 LAB — CBC
MCH: 32 pg (ref 26.0–34.0)
MCHC: 35 g/dL (ref 30.0–36.0)
RDW: 17.5 % — ABNORMAL HIGH (ref 11.5–15.5)

## 2011-10-29 LAB — TYPE AND SCREEN
Antibody Screen: POSITIVE
DAT, IgG: NEGATIVE
Donor AG Type: NEGATIVE
Donor AG Type: NEGATIVE
Donor AG Type: NEGATIVE

## 2011-10-29 LAB — COMPREHENSIVE METABOLIC PANEL
ALT: 18 U/L (ref 0–35)
Albumin: 3.4 g/dL — ABNORMAL LOW (ref 3.5–5.2)
Alkaline Phosphatase: 63 U/L (ref 39–117)
BUN: 8 mg/dL (ref 6–23)
Calcium: 8.7 mg/dL (ref 8.4–10.5)
Potassium: 3.8 mEq/L (ref 3.5–5.1)
Sodium: 135 mEq/L (ref 135–145)
Total Protein: 6.4 g/dL (ref 6.0–8.3)

## 2011-10-29 MED ORDER — CYCLOBENZAPRINE HCL 5 MG PO TABS
5.0000 mg | ORAL_TABLET | Freq: Once | ORAL | Status: AC
  Administered 2011-10-29: 5 mg via ORAL
  Filled 2011-10-29 (×3): qty 1

## 2011-10-29 NOTE — Progress Notes (Signed)
Patient complaining of excruciating back pain.  Patient screaming that her back is having spasms.  Pain unrelieved with IV Dilaudid 4mg .  Dr. Algie Coffer notified with orders for FLexeril 5mg .  Offered and gave patient massage to her back, then states that its starting to feel better.  Will continue to monitor.

## 2011-10-29 NOTE — Progress Notes (Signed)
Subjective:  Back pain and spasms.   Objective:  Vital Signs in the last 24 hours: Temp:  [98.6 F (37 C)-99.5 F (37.5 C)] 99.2 F (37.3 C) (06/15 1300) Pulse Rate:  [101-113] 101  (06/15 1300) Cardiac Rhythm:  [-]  Resp:  [14-17] 16  (06/15 1300) BP: (107-147)/(70-89) 116/75 mmHg (06/15 1300) SpO2:  [95 %-99 %] 95 % (06/15 1300)  Physical Exam: BP Readings from Last 1 Encounters:  10/29/11 116/75    Wt Readings from Last 1 Encounters:  10/27/11 80.015 kg (176 lb 6.4 oz) (93.58%*)   * Growth percentiles are based on CDC 2-20 Years data.    Weight change:   HEENT: [place]/AT, Eyes-Brown, PERL, EOMI, Conjunctiva-Pale pink, Sclera-icteric Neck: No JVD, No bruit, Trachea midline. Lungs:  Clear, Bilateral. Cardiac:  Regular rhythm, normal S1 and S2, no S3.  Abdomen:  Soft, non-tender. Extremities:  No edema present. No cyanosis. No clubbing. CNS: AxOx3, Cranial nerves grossly intact, moves all 4 extremities. Right handed. Skin: Warm and dry.   Intake/Output from previous day: 06/14 0701 - 06/15 0700 In: 1971 [P.O.:1080; I.V.:891] Out: 5 [Urine:5]    Lab Results: BMET    Component Value Date/Time   NA 135 10/29/2011 0937   K 3.8 10/29/2011 0937   CL 103 10/29/2011 0937   CO2 24 10/29/2011 0937   GLUCOSE 141* 10/29/2011 0937   BUN 8 10/29/2011 0937   CREATININE 0.57 10/29/2011 0937   CREATININE 0.59 06/13/2011 1142   CALCIUM 8.7 10/29/2011 0937   GFRNONAA >90 10/29/2011 0937   GFRAA >90 10/29/2011 0937   CBC    Component Value Date/Time   WBC 14.7* 10/29/2011 0937   RBC 3.00* 10/29/2011 0937   HGB 9.6* 10/29/2011 0937   HCT 27.4* 10/29/2011 0937   PLT 480* 10/29/2011 0937   MCV 91.3 10/29/2011 0937   MCH 32.0 10/29/2011 0937   MCHC 35.0 10/29/2011 0937   RDW 17.5* 10/29/2011 0937   LYMPHSABS 2.9 10/24/2011 1611   MONOABS 1.2* 10/24/2011 1611   EOSABS 0.6 10/24/2011 1611   BASOSABS 0.1 10/24/2011 1611   CARDIAC ENZYMES Lab Results  Component Value Date   CKTOTAL 62 01/15/2011   CKMB 1.9 01/15/2011   TROPONINI <0.30 01/15/2011    Assessment/Plan:  Patient Active Hospital Problem List: Sickle cell anemia and crisis Tobacco use disorder Depression Back pain Obesity  Continue medical treatment. Add Flexeril 5 mg. For muscle spasm.    LOS: 5 days    Orpah Cobb  MD  10/29/2011, 5:53 PM

## 2011-10-30 LAB — COMPREHENSIVE METABOLIC PANEL
Albumin: 3.4 g/dL — ABNORMAL LOW (ref 3.5–5.2)
Alkaline Phosphatase: 57 U/L (ref 39–117)
BUN: 8 mg/dL (ref 6–23)
CO2: 25 mEq/L (ref 19–32)
Chloride: 104 mEq/L (ref 96–112)
Creatinine, Ser: 0.72 mg/dL (ref 0.50–1.10)
GFR calc Af Amer: >90 mL/min (ref >90)
GFR calc non Af Amer: >90 mL/min (ref >90)
Glucose, Bld: 123 mg/dL — ABNORMAL HIGH (ref 70–99)
Total Bilirubin: 1.6 mg/dL — ABNORMAL HIGH (ref 0.3–1.2)

## 2011-10-30 LAB — CBC
HCT: 26 % — ABNORMAL LOW (ref 36.0–46.0)
Hemoglobin: 8.9 g/dL — ABNORMAL LOW (ref 12.0–15.0)
MCV: 91.5 fL (ref 78.0–100.0)
RDW: 17.4 % — ABNORMAL HIGH (ref 11.5–15.5)
WBC: 13.6 10*3/uL — ABNORMAL HIGH (ref 4.0–10.5)

## 2011-10-30 MED ORDER — METOPROLOL TARTRATE 25 MG PO TABS
25.0000 mg | ORAL_TABLET | Freq: Two times a day (BID) | ORAL | Status: DC
  Administered 2011-10-30 – 2011-10-31 (×4): 25 mg via ORAL
  Filled 2011-10-30 (×6): qty 1

## 2011-10-30 MED ORDER — CYCLOBENZAPRINE HCL 5 MG PO TABS
5.0000 mg | ORAL_TABLET | Freq: Once | ORAL | Status: AC
  Administered 2011-10-30: 5 mg via ORAL
  Filled 2011-10-30: qty 1

## 2011-10-30 MED ORDER — POLYETHYLENE GLYCOL 3350 17 G PO PACK
17.0000 g | PACK | Freq: Two times a day (BID) | ORAL | Status: DC | PRN
  Administered 2011-10-30 – 2011-11-01 (×3): 17 g via ORAL
  Filled 2011-10-30 (×3): qty 1

## 2011-10-30 NOTE — Progress Notes (Signed)
Subjective:  No complaints but sleeping. Flexeril apparently helping  Objective:  Vital Signs in the last 24 hours: Temp:  [98.7 F (37.1 C)-99.2 F (37.3 C)] 98.7 F (37.1 C) (06/16 0651) Pulse Rate:  [93-114] 106  (06/16 0651) Cardiac Rhythm:  [-]  Resp:  [16-20] 20  (06/16 0651) BP: (116-131)/(72-86) 118/75 mmHg (06/16 0651) SpO2:  [95 %-100 %] 100 % (06/16 0651) Weight:  [86 kg (189 lb 9.5 oz)] 86 kg (189 lb 9.5 oz) (06/16 0651)  Physical Exam: BP Readings from Last 1 Encounters:  10/30/11 118/75    Wt Readings from Last 1 Encounters:  10/30/11 86 kg (189 lb 9.5 oz) (96.18%*)   * Growth percentiles are based on CDC 2-20 Years data.    Weight change:   HEENT: [place]/AT, Eyes-Brown, PERL, EOMI, Conjunctiva-Pale pink, Sclera-icteric Neck: No JVD, No bruit, Trachea midline. Lungs:  Clear, Bilateral. Cardiac:  Regular rhythm, normal S1 and S2, no S3.  Abdomen:  Soft, non-tender. Extremities:  No edema present. No cyanosis. No clubbing. CNS: AxOx3, Cranial nerves grossly intact, moves all 4 extremities. Right handed. Skin: Warm and dry.   Intake/Output from previous day: 06/15 0701 - 06/16 0700 In: 2010 [P.O.:480; I.V.:1030; IV Piggyback:500] Out: 406 [Urine:406]    Lab Results: BMET    Component Value Date/Time   NA 138 10/30/2011 0710   K 4.0 10/30/2011 0710   CL 104 10/30/2011 0710   CO2 25 10/30/2011 0710   GLUCOSE 123* 10/30/2011 0710   BUN 8 10/30/2011 0710   CREATININE 0.72 10/30/2011 0710   CREATININE 0.59 06/13/2011 1142   CALCIUM 8.9 10/30/2011 0710   GFRNONAA >90 10/30/2011 0710   GFRAA >90 10/30/2011 0710   CBC    Component Value Date/Time   WBC 13.6* 10/30/2011 0710   RBC 2.84* 10/30/2011 0710   HGB 8.9* 10/30/2011 0710   HCT 26.0* 10/30/2011 0710   PLT 358 10/30/2011 0710   MCV 91.5 10/30/2011 0710   MCH 31.3 10/30/2011 0710   MCHC 34.2 10/30/2011 0710   RDW 17.4* 10/30/2011 0710   LYMPHSABS 2.9 10/24/2011 1611   MONOABS 1.2* 10/24/2011 1611   EOSABS 0.6  10/24/2011 1611   BASOSABS 0.1 10/24/2011 1611   CARDIAC ENZYMES Lab Results  Component Value Date   CKTOTAL 62 01/15/2011   CKMB 1.9 01/15/2011   TROPONINI <0.30 01/15/2011    Assessment/Plan:  Patient Active Hospital Problem List:  Sickle cell anemia and crisis  Tobacco use disorder  Depression  Back pain  Obesity  Continue medical treatment.    LOS: 6 days    Orpah Cobb  MD  10/30/2011, 9:58 AM

## 2011-10-31 LAB — COMPREHENSIVE METABOLIC PANEL
ALT: 28 U/L (ref 0–35)
AST: 34 U/L (ref 0–37)
Albumin: 3.4 g/dL — ABNORMAL LOW (ref 3.5–5.2)
CO2: 25 mEq/L (ref 19–32)
Chloride: 103 mEq/L (ref 96–112)
GFR calc non Af Amer: >90 mL/min (ref >90)
Potassium: 3.7 mEq/L (ref 3.5–5.1)
Sodium: 136 mEq/L (ref 135–145)
Total Bilirubin: 1.4 mg/dL — ABNORMAL HIGH (ref 0.3–1.2)

## 2011-10-31 LAB — CBC
Platelets: 382 10*3/uL (ref 150–400)
RBC: 2.8 MIL/uL — ABNORMAL LOW (ref 3.87–5.11)
RDW: 17.2 % — ABNORMAL HIGH (ref 11.5–15.5)
WBC: 12.9 10*3/uL — ABNORMAL HIGH (ref 4.0–10.5)

## 2011-10-31 NOTE — Progress Notes (Signed)
Subjective: [name] seen on rounds today. Sleeping soundly difficult to aroused when she dis she was alert and oriented pain continues in her back and abdominal pains. Still has not had a BM which she feels is why stomach is cramping. Pain is rated 6/10. Discussed lab results and improvement plans for d/c tomorrow.  Objective: Vital signs in last 24 hours: Filed Vitals:   10/30/11 1500 10/30/11 2209 10/31/11 0227 10/31/11 0547  BP: 112/75 123/85 98/56 105/64  Pulse: 98 99 96 95  Temp: 98.3 F (36.8 C) 99.2 F (37.3 C) 98.9 F (37.2 C) 98.7 F (37.1 C)  TempSrc:  Oral Oral Oral  Resp: 20 20 18 20   Height:      Weight:    88.6 kg (195 lb 5.2 oz)  SpO2: 100% 97% 96% 100%   Weight change: 2.6 kg (5 lb 11.7 oz)  Intake/Output Summary (Last 24 hours) at 10/31/11 1201 Last data filed at 10/31/11 0044  Gross per 24 hour  Intake    480 ml  Output    350 ml  Net    130 ml    Physical Exam: General Appearance: Drowsy, cooperative, well nourished, well developed, appears not to be in acute distress  Head: Normocephalic, atraumatic  Eyes: PERRLA, EOMI, scleral icterus  Nose: Nares, septum and mucosa are normal, no visible drainage, no sinus tenderness  Throat: Lips, mucosa, tongue, teeth good repairNeck: Trachea is midline, no lymphophadenopathy, mass or thyromegaly  Back: Symmetric, no curvature, bilateral CVA tenderness, diffuse tenderness  Resp: Diminished breath sounds bibasilar and bilaterally, CTA, no wheezes/rales/rhonchi  Cardio: Regular rate and rhythm, S1, S2 normal, soft murmur, no click/rub/gallop  GI: Soft, distended,  Hypoactive bowel sounds present , no organomegaly  Extremities: no cyanosis, Homans sign is negative,Pulses: 2+ and symmetric  Skin: Skin color, texture and turgor are normal  Neurologic: CN II - XII intact  Psych: Appropriate affect    Lab Results:  Basename 10/31/11 0640 10/30/11 0710  NA 136 138  K 3.7 4.0  CL 103 104  CO2 25 25  GLUCOSE 115*  123*  BUN 7 8  CREATININE 0.63 0.72  CALCIUM 8.8 8.9  MG -- --  PHOS -- --    Basename 10/31/11 0640 10/30/11 0710  AST 34 25  ALT 28 22  ALKPHOS 62 57  BILITOT 1.4* 1.6*  PROT 6.4 6.4  ALBUMIN 3.4* 3.4*   No results found for this basename: LIPASE:2,AMYLASE:2 in the last 72 hours  Basename 10/31/11 0640 10/30/11 0710  WBC 12.9* 13.6*  NEUTROABS -- --  HGB 8.8* 8.9*  HCT 25.7* 26.0*  MCV 91.8 91.5  PLT 382 358   No results found for this basename: CKTOTAL:3,CKMB:3,CKMBINDEX:3,TROPONINI:3 in the last 72 hours No components found with this basename: POCBNP:3 No results found for this basename: DDIMER:2 in the last 72 hours No results found for this basename: HGBA1C:2 in the last 72 hours No results found for this basename: CHOL:2,HDL:2,LDLCALC:2,TRIG:2,CHOLHDL:2,LDLDIRECT:2 in the last 72 hours No results found for this basename: TSH,T4TOTAL,FREET3,T3FREE,THYROIDAB in the last 72 hours No results found for this basename: VITAMINB12:2,FOLATE:2,FERRITIN:2,TIBC:2,IRON:2,RETICCTPCT:2 in the last 72 hours  Micro Results: No results found for this or any previous visit (from the past 240 hour(s)).  Studies/Results: No results found.  Medications:  Scheduled Meds:   . alteplase  2 mg Intracatheter Once  . cholecalciferol  1,000 Units Oral Daily  . deferoxamine (DESFERAL) IV  2,000 mg Intravenous Q24H  . FLUoxetine  20 mg Oral QHS  .  fluvoxaMINE  25 mg Oral QHS  . folic acid  1 mg Oral Daily  . hydroxyurea  1,000 mg Oral Daily  . ibuprofen  600 mg Oral TID  . metoprolol tartrate  25 mg Oral BID  . pantoprazole  40 mg Oral Q1200  . potassium chloride  10 mEq Oral Daily  . sodium chloride  10-40 mL Intracatheter Q12H   Continuous Infusions:   . dextrose 5 % and 0.45% NaCl 125 mL/hr at 10/29/11 0212  . dextrose 5 % and 0.45% NaCl 100 mL/hr at 10/30/11 2251   PRN Meds:.acetaminophen, diphenhydrAMINE, diphenhydrAMINE, HYDROmorphone (DILAUDID) injection, ondansetron  (ZOFRAN) IV, ondansetron, polyethylene glycol, promethazine, sodium chloride  Assessment/Plan: Active Problems: Sickle cell crisis She is receiving pain/nausea/pruritis/bowel management, IV hydration and Hydroxyurea CMP and CBC daily  Hemolysis active mild improvement Constipation Miralx bid    LOS: 7 days  Stevenson Windmiller [phone] 10/31/2011, 12:01 PM

## 2011-11-01 LAB — COMPREHENSIVE METABOLIC PANEL
ALT: 29 U/L (ref 0–35)
AST: 28 U/L (ref 0–37)
CO2: 27 mEq/L (ref 19–32)
Calcium: 8.9 mg/dL (ref 8.4–10.5)
Creatinine, Ser: 0.64 mg/dL (ref 0.50–1.10)
GFR calc Af Amer: >90 mL/min (ref >90)
GFR calc non Af Amer: >90 mL/min (ref >90)
Glucose, Bld: 103 mg/dL — ABNORMAL HIGH (ref 70–99)
Sodium: 138 mEq/L (ref 135–145)
Total Protein: 6.4 g/dL (ref 6.0–8.3)

## 2011-11-01 LAB — CBC
MCH: 32.1 pg (ref 26.0–34.0)
MCHC: 35.2 g/dL (ref 30.0–36.0)
MCV: 91.4 fL (ref 78.0–100.0)
Platelets: 433 10*3/uL — ABNORMAL HIGH (ref 150–400)
RBC: 2.8 MIL/uL — ABNORMAL LOW (ref 3.87–5.11)

## 2011-11-01 MED ORDER — OXYCODONE HCL 10 MG PO TABS: 10.0000 mg | ORAL_TABLET | Freq: Four times a day (QID) | ORAL | Status: DC | PRN

## 2011-11-01 MED ORDER — LEVOFLOXACIN 750 MG PO TABS: 750.0000 mg | ORAL_TABLET | Freq: Every day | ORAL | Status: AC

## 2011-11-01 MED ORDER — OXYCODONE HCL 15 MG PO TABS: 15.0000 mg | ORAL_TABLET | ORAL | Status: DC | PRN

## 2011-11-01 NOTE — Discharge Summary (Signed)
Discharge Summary  [name] MR#: [id number]  DOB:1991-03-12  Date of Admission: 10/24/2011 Date of Discharge: 11/01/2011  Patient's PCP: [name], MD  Attending Physician:Deaundra Kutzer P  Consults:  Dr. August Saucer    Discharge Diagnoses: Active Problems:  Sickle Cell Crisis   Leucocytosis    Brief Admitting History   [name] is a 21 y.o. old female presenting to [facility] with c/o pain in joints and a low grad fever . Joint pain x's 24 hrs progressivley worsened with not relief from her home regiment.  Fever improved with analgesic Staff unable to place IV in at that time  Opt to admitted for management of SCD crisis  Discharge Medications Medication List  As of 11/01/2011 11:57 AM   STOP taking these medications         FLUoxetine 20 MG tablet         TAKE these medications         cholecalciferol 1000 UNITS tablet   Commonly known as: VITAMIN D   Take 1,000 Units by mouth daily.      cyclobenzaprine 5 MG tablet   Commonly known as: FLEXERIL   Take 1 tablet (5 mg total) by mouth 2 (two) times daily as needed for muscle spasms.      ibuprofen 200 MG tablet   Commonly known as: ADVIL,MOTRIN   Take 600 mg by mouth daily.      oxyCODONE 15 MG immediate release tablet   Commonly known as: ROXICODONE   Take 1 tablet (15 mg total) by mouth every 4 (four) hours as needed for pain.      Oxycodone HCl 10 MG Tabs   Take 1 tablet (10 mg total) by mouth 4 (four) times daily as needed. For pain.      pantoprazole 40 MG tablet   Commonly known as: PROTONIX   Take 40 mg by mouth every morning.      prenatal multivitamin 60-1 MG tablet   Take 1 tablet by mouth every morning.         ASK your doctor about these medications         doxycycline 100 MG tablet   Commonly known as: VIBRA-TABS   Take 1 tablet (100 mg total) by mouth 2 (two) times daily.      hydroxyurea 500 MG capsule   Commonly known as: HYDREA   Take 1,000 mg by mouth daily. May take  with food to minimize GI side effects.        Discontinue Doxycycline state Levaquin     Hospital Course: She received  pain/nausea/pruritis/bowel management, IV hydration, home medications , daily CMP and CBC. Blood exchanged and blood transfusions.   Present on Admission:  Crisis Leucocytosis  Day of Discharge BP 106/73  Pulse 90  Temp 98.4 F (36.9 C) (Oral)  Resp 16  Ht 5' 3.5" (1.613 m)  Wt 87.7 kg (193 lb 5.5 oz)  BMI 33.71 kg/m2  SpO2 96%  LMP 10/17/2011  Breastfeeding? No  General Appearance:Sleeping/snoring,difficult to arouse  cooperative, well nourished, well developed, no acute distress (pain 6/10 manageable at home) has not had pain medication since 1 AM Head: Normocephalic, atraumatic  Eyes: PERRLA, EOMI, scleral icterus  Nose: Nares, septum and mucosa are normal, no visible drainage, no sinus tenderness  Throat: Lips, mucosa, tongue, teeth and gums are normal  Neck: Trachea is midline, no lymphophadenopathy, mass or thyromegaly  Back: Symmetric, no curvature, bilateral CVA tenderness, diffuse tenderness  Resp: Diminished breath sounds  bibasilar and bilaterally, CTA, no wheezes/rales/rhonchi  Cardio: Regular rate and rhythm, S1, S2 normal, soft murmur, no click/rub/gallop  GI: Soft, distended, bowel sounds, no organomegaly  Extremities: Atraumatic, no cyanosis or edema, Homans sign is negative, Pulses: 2+ and symmetric  Skin: Skin color, texture and turgor are normal,  Neurologic: Grossly normal, AO x 3, CN II - XII intact, no focal deficits  Psych:  appropriate affect    Results for orders placed during the hospital encounter of 10/24/11 (from the past 48 hour(s))  CBC     Status: Abnormal   Collection Time   10/31/11  6:40 AM      Component Value Range Comment   WBC 12.9 (*) 4.0 - 10.5 K/uL    RBC 2.80 (*) 3.87 - 5.11 MIL/uL    Hemoglobin 8.8 (*) 12.0 - 15.0 g/dL    HCT 45.4 (*) 09.8 - 46.0 %    MCV 91.8  78.0 - 100.0 fL    MCH 31.4  26.0 - 34.0 pg     MCHC 34.2  30.0 - 36.0 g/dL    RDW 11.9 (*) 14.7 - 15.5 %    Platelets 382  150 - 400 K/uL   COMPREHENSIVE METABOLIC PANEL     Status: Abnormal   Collection Time   10/31/11  6:40 AM      Component Value Range Comment   Sodium 136  135 - 145 mEq/L    Potassium 3.7  3.5 - 5.1 mEq/L    Chloride 103  96 - 112 mEq/L    CO2 25  19 - 32 mEq/L    Glucose, Bld 115 (*) 70 - 99 mg/dL    BUN 7  6 - 23 mg/dL    Creatinine, Ser 8.29  0.50 - 1.10 mg/dL    Calcium 8.8  8.4 - 56.2 mg/dL    Total Protein 6.4  6.0 - 8.3 g/dL    Albumin 3.4 (*) 3.5 - 5.2 g/dL    AST 34  0 - 37 U/L    ALT 28  0 - 35 U/L    Alkaline Phosphatase 62  39 - 117 U/L    Total Bilirubin 1.4 (*) 0.3 - 1.2 mg/dL    GFR calc non Af Amer >90  >90 mL/min    GFR calc Af Amer >90  >90 mL/min   CBC     Status: Abnormal   Collection Time   11/01/11  6:58 AM      Component Value Range Comment   WBC 17.2 (*) 4.0 - 10.5 K/uL    RBC 2.80 (*) 3.87 - 5.11 MIL/uL    Hemoglobin 9.0 (*) 12.0 - 15.0 g/dL    HCT 13.0 (*) 86.5 - 46.0 %    MCV 91.4  78.0 - 100.0 fL    MCH 32.1  26.0 - 34.0 pg    MCHC 35.2  30.0 - 36.0 g/dL    RDW 78.4 (*) 69.6 - 15.5 %    Platelets 433 (*) 150 - 400 K/uL   COMPREHENSIVE METABOLIC PANEL     Status: Abnormal   Collection Time   11/01/11  6:58 AM      Component Value Range Comment   Sodium 138  135 - 145 mEq/L    Potassium 3.9  3.5 - 5.1 mEq/L    Chloride 103  96 - 112 mEq/L    CO2 27  19 - 32 mEq/L    Glucose, Bld 103 (*) 70 - 99 mg/dL  BUN 12  6 - 23 mg/dL    Creatinine, Ser 1.61  0.50 - 1.10 mg/dL    Calcium 8.9  8.4 - 09.6 mg/dL    Total Protein 6.4  6.0 - 8.3 g/dL    Albumin 3.4 (*) 3.5 - 5.2 g/dL    AST 28  0 - 37 U/L    ALT 29  0 - 35 U/L    Alkaline Phosphatase 68  39 - 117 U/L    Total Bilirubin 1.8 (*) 0.3 - 1.2 mg/dL    GFR calc non Af Amer >90  >90 mL/min    GFR calc Af Amer >90  >90 mL/min     Dg Chest 2 View  10/06/2011  *RADIOLOGY REPORT*  Clinical Data: Of breath.  History of  sickle cell disease.  CHEST - 2 VIEW  Comparison: Two-view chest x-ray 07/14/2011, 06/09/2011, 05/19/2011, 10/20/2010.  Findings: Cardiomediastinal silhouette unremarkable, unchanged. Linear scarring in the right upper lobe, unchanged.  No new pulmonary parenchymal abnormalities.  No pleural effusions. 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  No significant interval change.  IMPRESSION: No acute cardiopulmonary disease.  Linear scarring in the right upper lobe.  Stable examination.  Original Report Authenticated By: Arnell Sieving, M.D.   US Transvaginal Non-ob  10/11/2011  *RADIOLOGY REPORT*  Clinical Data: Right lower quadrant pain  TRANSABDOMINAL AND TRANSVAGINAL ULTRASOUND OF PELVIS Technique:  Both transabdominal and transvaginal ultrasound examinations of the pelvis were performed. Transabdominal technique was performed for global imaging of the pelvis including uterus, ovaries, adnexal regions, and pelvic cul-de-sac.  Comparison: February 20, 2011   It was necessary to proceed with endovaginal exam following the transabdominal exam to visualize the adnexa.  Findings:  The uterus is normal in size and echotexture, measuring 6.8 x 3.0 x 3.6 cm.  Endometrial stripe is thin and homogeneous, measuring 2.5 mm in width.  The left ovary has a normal size and appearance, measuring 2.3 x 1.4 x 2.2 cm.  The right ovary measures 3.5 x 2.5 x 2.5 cm and contains a 2.6 cm simple cyst.  There are no adnexal masses or free pelvic fluid.  IMPRESSION: 2.6 cm simple cyst on the right ovary is within normal limits.  Original Report Authenticated By: Brandon Melnick, M.D.   US Pelvis Complete  10/11/2011  *RADIOLOGY REPORT*  Clinical Data: Right lower quadrant pain  TRANSABDOMINAL AND TRANSVAGINAL ULTRASOUND OF PELVIS Technique:  Both transabdominal and transvaginal ultrasound examinations of the pelvis were performed. Transabdominal technique was performed for global imaging of the pelvis including  uterus, ovaries, adnexal regions, and pelvic cul-de-sac.  Comparison: February 20, 2011   It was necessary to proceed with endovaginal exam following the transabdominal exam to visualize the adnexa.  Findings:  The uterus is normal in size and echotexture, measuring 6.8 x 3.0 x 3.6 cm.  Endometrial stripe is thin and homogeneous, measuring 2.5 mm in width.  The left ovary has a normal size and appearance, measuring 2.3 x 1.4 x 2.2 cm.  The right ovary measures 3.5 x 2.5 x 2.5 cm and contains a 2.6 cm simple cyst.  There are no adnexal masses or free pelvic fluid.  IMPRESSION: 2.6 cm simple cyst on the right ovary is within normal limits.  Original Report Authenticated By: Brandon Melnick, M.D.     Disposition: Home   Diet: Healthy Heart  Activity: As tolerated    Follow-up Appts: November 10, 2011 @ 2:00pm with Dr. [facility]  TESTS THAT NEED FOLLOW-UP To be determine on follow up appoint  Time spent on discharge, talking to the patient, and coordinating care: 60 mins.   SignedGrayce Sessions [phone] 11/01/2011, 11:57 AM

## 2011-11-04 ENCOUNTER — Encounter (HOSPITAL_BASED_OUTPATIENT_CLINIC_OR_DEPARTMENT_OTHER): Payer: Self-pay | Admitting: *Deleted

## 2011-11-04 ENCOUNTER — Emergency Department (HOSPITAL_BASED_OUTPATIENT_CLINIC_OR_DEPARTMENT_OTHER)
Admission: EM | Admit: 2011-11-04 | Discharge: 2011-11-04 | Disposition: A | Discharge status: Home / Self Care | Payer: Medicaid Other | Attending: Emergency Medicine | Admitting: Emergency Medicine

## 2011-11-04 DIAGNOSIS — R000 Tachycardia, unspecified: Secondary | ICD-10-CM | POA: Insufficient documentation

## 2011-11-04 DIAGNOSIS — M549 Dorsalgia, unspecified: Secondary | ICD-10-CM | POA: Insufficient documentation

## 2011-11-04 DIAGNOSIS — D5700 Hb-SS disease with crisis, unspecified: Secondary | ICD-10-CM

## 2011-11-04 DIAGNOSIS — R109 Unspecified abdominal pain: Secondary | ICD-10-CM | POA: Insufficient documentation

## 2011-11-04 LAB — DIFFERENTIAL
Basophils Relative: 0 % (ref 0–1)
Eosinophils Relative: 2 % (ref 0–5)
Lymphocytes Relative: 20 % (ref 12–46)
Monocytes Relative: 10 % (ref 3–12)
Neutro Abs: 9.8 10*3/uL — ABNORMAL HIGH (ref 1.7–7.7)
Neutrophils Relative %: 68 % (ref 43–77)

## 2011-11-04 LAB — RETICULOCYTES: Retic Ct Pct: 13.8 % — ABNORMAL HIGH (ref 0.4–3.1)

## 2011-11-04 LAB — COMPREHENSIVE METABOLIC PANEL
ALT: 22 U/L (ref 0–35)
AST: 26 U/L (ref 0–37)
Albumin: 4.5 g/dL (ref 3.5–5.2)
Alkaline Phosphatase: 64 U/L (ref 39–117)
CO2: 23 mEq/L (ref 19–32)
Chloride: 103 mEq/L (ref 96–112)
Creatinine, Ser: 0.6 mg/dL (ref 0.50–1.10)
Potassium: 3.6 mEq/L (ref 3.5–5.1)
Sodium: 137 mEq/L (ref 135–145)
Total Bilirubin: 1.6 mg/dL — ABNORMAL HIGH (ref 0.3–1.2)

## 2011-11-04 LAB — PREGNANCY, URINE: Preg Test, Ur: NEGATIVE

## 2011-11-04 LAB — CBC
HCT: 30.6 % — ABNORMAL LOW (ref 36.0–46.0)
Hemoglobin: 11 g/dL — ABNORMAL LOW (ref 12.0–15.0)
RBC: 3.4 MIL/uL — ABNORMAL LOW (ref 3.87–5.11)

## 2011-11-04 LAB — URINALYSIS, ROUTINE W REFLEX MICROSCOPIC
Bilirubin Urine: NEGATIVE
Glucose, UA: NEGATIVE mg/dL
Hgb urine dipstick: NEGATIVE
Protein, ur: NEGATIVE mg/dL

## 2011-11-04 MED ORDER — SODIUM CHLORIDE 0.9 % IV BOLUS (SEPSIS)
1000.0000 mL | Freq: Once | INTRAVENOUS | Status: AC
  Administered 2011-11-04: 1000 mL via INTRAVENOUS

## 2011-11-04 MED ORDER — SODIUM CHLORIDE 0.9 % IV SOLN: INTRAVENOUS | Status: DC

## 2011-11-04 MED ORDER — HYDROMORPHONE HCL PF 2 MG/ML IJ SOLN
2.0000 mg | Freq: Once | INTRAMUSCULAR | Status: AC
  Administered 2011-11-04: 2 mg via INTRAVENOUS
  Filled 2011-11-04: qty 1

## 2011-11-04 MED ORDER — OXYCODONE-ACETAMINOPHEN 5-325 MG PO TABS
2.0000 | ORAL_TABLET | Freq: Once | ORAL | Status: AC
  Administered 2011-11-04: 2 via ORAL
  Filled 2011-11-04: qty 2

## 2011-11-04 MED ORDER — ONDANSETRON HCL 4 MG/2ML IJ SOLN
4.0000 mg | Freq: Once | INTRAMUSCULAR | Status: AC
  Administered 2011-11-04: 4 mg via INTRAVENOUS
  Filled 2011-11-04: qty 2

## 2011-11-04 MED ORDER — OXYCODONE HCL 5 MG PO TABS
10.0000 mg | ORAL_TABLET | Freq: Once | ORAL | Status: DC
  Filled 2011-11-04: qty 2

## 2011-11-04 NOTE — ED Notes (Signed)
Pt to triage in w/c, reports her usual sickle cell crisis pain x this am. Pt states she is now vomiting due to her pain, teary at triage. Pt states she felt fine yesterday.

## 2011-11-04 NOTE — ED Notes (Addendum)
Pt. Crying and wanting her boyfriend to stay in the room with her

## 2011-11-04 NOTE — Discharge Instructions (Signed)
Please read and follow all provided instructions.  Your diagnoses today include:  1. Vasoocclusive sickle cell crisis     Tests performed today include:  Blood tests - are expected for your sickle cell anemia, no signs of infection  Vital signs. See below for your results today.   Medications prescribed:   None  Home care instructions:  Follow any educational materials contained in this packet.  Follow-up instructions: Please follow-up with your primary care provider in the next 2 days for further evaluation of your symptoms. If you do not have a primary care doctor -- see below for referral information.   If you need to return to the hospital due to pain, go to [facility] so you may be easily transferred to the sickle cell clinic if you are well enough to do so.   Return instructions:   Please return to the Emergency Department if you experience worsening symptoms.   Please return if you have any other emergent concerns.  Additional Information:  Your vital signs today were: BP 104/61  Pulse 98  Temp 97.5 F (36.4 C) (Oral)  Resp 24  SpO2 100%  LMP 10/17/2011 If your blood pressure (BP) was elevated above 135/85 this visit, please have this repeated by your doctor within one month. -------------- No Primary Care Doctor Call [facility]  [phone] Other agencies that provide inexpensive medical care    [facility]  [phone]    [facility]  [phone]    [facility]  [phone]    [facility]  [phone]    [facility]  [phone]    [facility]  [phone] -------------- RESOURCE GUIDE:  Dental Problems  Patients with Medicaid: [facility] [phone] [address]                                            [phone] [address] Phone:  [phone]                                                   Phone:  [phone]  If unable to pay or uninsured, contact:   [facility] or [facility]. to become qualified for the adult dental clinic.  Chronic Pain Problems Contact [facility]  [phone] Patients need to be referred by their primary care doctor.  Insufficient Money for Medicine Contact [employer]:  call "211" or [facility] [phone].  Psychological Services [facility]  [phone] [facility]  [phone] [facility]   [phone] (emergency services [phone])  Substance Abuse Resources [facility]  [phone] [facility] [phone] [facility] [phone] Floydene Flock [phone] Residential & Outpatient Substance Abuse Program  [phone]  Abuse/Neglect [facility] Child Abuse Hotline 984-404-6834 [facility] Child Abuse Hotline [phone] (After Hours)  Emergency Shelter [facility] [phone]  Maternity Homes Room at the [place] [facility] [place] [phone] [facility] [phone]  [facility] [place]  [address]                          [facility] Dept. [address] [place]                       [address]      [address]  Blondell Reveal Phone:  [phone]                                   Phone:  [phone]                 Phone:  [phone]  [facility] Phone:  [phone]  [facility] Child Abuse Hotline 501-840-6933 [phone] (After Hours)

## 2011-11-04 NOTE — ED Notes (Signed)
No noted vomiting or complaints of nausea.  Pt. Has had more pain meds approx. ago and reports she has no pain relief.

## 2011-11-04 NOTE — ED Provider Notes (Signed)
This 21 year old female was discharged 3 days ago from the hospital for a sickle cell pain exacerbation and return to the same symptoms today. Her pain was relatively well-controlled the last 2 days since her hospital stay at home medications at home medications not strong enough today to help her pain syndrome. She is a typical pain syndrome in her back without chest pain cough shortness of breath. She is no fever or altered mental status. Her pain radiates towards her abdomen like he usually does but on my examination her abdomen itself is actually nontender. Her anterior and lateral chest walls are nontender as well. She does have some back and paralumbar tenderness which she states is typical for her.  Medical screening examination/treatment/procedure(s) were conducted as a shared visit with non-physician practitioner(s) and myself.  I personally evaluated the patient during the encounter  Hurman Horn, MD 11/10/11 [phone]

## 2011-11-04 NOTE — ED Provider Notes (Signed)
History     CSN: [id number]  Arrival date & time 11/04/11  1519   First MD Initiated Contact with Patient 11/04/11 1546      Chief Complaint  Patient presents with  . Abdominal Pain  . Back Pain  . Sickle Cell Pain Crisis    (Consider location/radiation/quality/duration/timing/severity/associated sxs/prior treatment) HPI Comments: Patient with history of sickle cell anemia presents with complaint of upper back pain and lateral rib cage pain that started worsening this morning. Patient has also had episodes of vomiting. She states that these symptoms are typical for her usual sickle cell pain crisis. She does not have a history of acute chest syndrome. She is not having any chest pain or shortness of breath at the current time. Patient took her 10 mg oxycodone this morning and hydroxyurea however this did not improve her symptoms. She denies weakness, numbness, or tingling in her extremities. She denies fever, upper respiratory tract infection symptoms, cough, diarrhea, urinary symptoms, skin infections. Nothing makes the symptoms better. Movement makes her pain worse. Course is constant. Onset was gradual.  Patient is a 21 y.o. female presenting with abdominal pain, back pain, and sickle cell pain. The history is provided by the patient.  Abdominal Pain The primary symptoms of the illness include abdominal pain, nausea and vomiting. The primary symptoms of the illness do not include fever, shortness of breath, diarrhea, hematochezia or dysuria. The current episode started 6 to 12 hours ago. The onset of the illness was gradual. The problem has not changed since onset. Additional symptoms associated with the illness include back pain. Significant associated medical issues include sickle cell disease.  Back Pain  Associated symptoms include abdominal pain. Pertinent negatives include no chest pain, no fever, no headaches and no dysuria.  Sickle Cell Pain Crisis  Associated symptoms include  abdominal pain, nausea, vomiting and back pain. Pertinent negatives include no chest pain, no diarrhea, no dysuria, no headaches, no rhinorrhea, no sore throat, no cough, no rash and no eye redness.    Past Medical History  Diagnosis Date  . H/O: 1 miscarriage 03/22/2011  . 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 Active smoker 08/09/2010  . Depression 01/06/2011  . GERD (gastroesophageal reflux disease) 02/17/2011  . Trichotillomania     h/o  . Blood transfusion   . Sickle cell anemia with crisis     Past Surgical History  Procedure Date  . Cholecystectomy 05/2010  . Dilation and curettage of uterus 02/20/11    History reviewed. No pertinent family history.  History  Substance Use Topics  . Smoking status: Former Smoker    Quit date: 07/15/2011  . Smokeless tobacco: Never Used  . Alcohol Use: No    OB History    Grav Para Term Preterm Abortions TAB SAB Ect Mult Living   1    1           Review of Systems  Constitutional: Negative for fever.  HENT: Negative for sore throat and rhinorrhea.   Eyes: Negative for redness.  Respiratory: Negative for cough and shortness of breath.   Cardiovascular: Negative for chest pain.  Gastrointestinal: Positive for nausea, vomiting and abdominal pain. Negative for diarrhea and hematochezia.  Genitourinary: Negative for dysuria.  Musculoskeletal: Positive for back pain. Negative for myalgias.  Skin: Negative for rash.  Neurological: Negative for headaches.    Allergies  Ketorolac tromethamine  Home Medications   Current Outpatient Rx  Name Route Sig Dispense Refill  . VITAMIN D 1000 UNITS PO TABS  Oral Take 1,000 Units by mouth daily.    . CYCLOBENZAPRINE HCL 5 MG PO TABS Oral Take 1 tablet (5 mg total) by mouth 2 (two) times daily as needed for muscle spasms. 30 tablet 0  . HYDROXYUREA 500 MG PO CAPS Oral Take 1,000 mg by mouth daily. May take with food to minimize GI side effects.    . IBUPROFEN 200 MG PO TABS Oral Take 600 mg by mouth  daily.     Marland Kitchen LEVOFLOXACIN 750 MG PO TABS Oral Take 1 tablet (750 mg total) by mouth daily. 7 tablet 0  . OXYCODONE HCL 15 MG PO TABS Oral Take 1 tablet (15 mg total) by mouth every 4 (four) hours as needed for pain. 60 tablet 0  . OXYCODONE HCL 10 MG PO TABS Oral Take 1 tablet (10 mg total) by mouth 4 (four) times daily as needed. For pain. 60 tablet 0  . PANTOPRAZOLE SODIUM 40 MG PO TBEC Oral Take 40 mg by mouth every morning.     Marland Kitchen PRENATAL RX 60-1 MG PO TABS Oral Take 1 tablet by mouth every morning.      BP 125/104  Pulse 140  Temp 99 F (37.2 C) (Oral)  Resp 24  SpO2 100%  LMP 10/17/2011  Physical Exam  Nursing note and vitals reviewed. Constitutional: She appears well-developed and well-nourished.  HENT:  Head: Normocephalic and atraumatic.  Right Ear: External ear normal.  Left Ear: External ear normal.  Nose: Nose normal.  Mouth/Throat: Uvula is midline and oropharynx is clear and moist. Mucous membranes are not dry.  Eyes: Conjunctivae are normal. Right eye exhibits no discharge. Left eye exhibits no discharge.  Neck: Normal range of motion. Neck supple.  Cardiovascular: Regular rhythm and normal heart sounds.  Tachycardia present.   Pulmonary/Chest: Effort normal and breath sounds normal. She exhibits tenderness.    Abdominal: Soft. There is no tenderness.  Musculoskeletal:       Cervical back: She exhibits tenderness.       Thoracic back: She exhibits tenderness.       Patient moves all extremities well without difficulty or pain.  Lymphadenopathy:    She has no cervical adenopathy.  Neurological: She is alert. She has normal strength. No sensory deficit. Gait normal.  Skin: Skin is warm and dry.  Psychiatric: She has a normal mood and affect.    ED Course  Procedures (including critical care time)  Labs Reviewed  CBC - Abnormal; Notable for the following:    WBC 14.4 (*)  WHITE COUNT CONFIRMED ON SMEAR   RBC 3.40 (*)     Hemoglobin 11.0 (*)     HCT 30.6  (*)     Platelets 522 (*)     All other components within normal limits  DIFFERENTIAL - Abnormal; Notable for the following:    Neutro Abs 9.8 (*)     Monocytes Absolute 1.4 (*)     All other components within normal limits  COMPREHENSIVE METABOLIC PANEL - Abnormal; Notable for the following:    Glucose, Bld 101 (*)     Total Bilirubin 1.6 (*)     All other components within normal limits  RETICULOCYTES - Abnormal; Notable for the following:    Retic Ct Pct 13.8 (*)     RBC. 3.50 (*)     Retic Count, Manual 483.0 (*)     All other components within normal limits  URINALYSIS, ROUTINE W REFLEX MICROSCOPIC  PREGNANCY, URINE   No results  found.   1. Vasoocclusive sickle cell crisis     4:07 PM Patient seen and examined. Work-up initiated. Medications ordered.   Vital signs reviewed and are as follows: Filed Vitals:   11/04/11 1530  BP: 125/104  Pulse: 140  Temp: 99 F (37.2 C)  Resp: 24   7:09 PM Patient seen by Dr. Fonnie Jarvis earlier. Requests additional pain medication.   8:14 PM patient given option of admission to the hospital versus discharge to home. Patient requests discharge to home. I urged the patient to continue her home pain medications as prescribed by her doctor. The patient was urged to return with worsening symptoms, shortness of breath, fever, or any other concerns. Patient verbalizes understanding and agrees with the plan.    MDM  Patient with sickle cell anemia with typical symptoms of vaso-occlusive pain crisis. Patient has expected reticulocyte production. Patient's anemia is at baseline. Patient does not have any focal infectious findings. She is not having any shortness of breath or chest pain that would be suggestive of an acute chest syndrome. Patient is improved clinically in emergency department with typical treatment. Patient has pain medicine for treatment and home. She requests discharge.         Renne Crigler, [place] 11/04/11 2016

## 2011-11-04 NOTE — ED Notes (Signed)
Pt. Has been eating [employer] and her boyfriend is now coming out of room reporting that the Pt. Is gagging Encouraged Pt. Not to eat any more and rest.  Pt. Just received medicine for pain 2mg  of dilaudid.

## 2011-11-08 ENCOUNTER — Encounter (HOSPITAL_COMMUNITY): Payer: Self-pay | Admitting: Emergency Medicine

## 2011-11-08 ENCOUNTER — Inpatient Hospital Stay (HOSPITAL_COMMUNITY): Payer: Medicaid Other

## 2011-11-08 ENCOUNTER — Inpatient Hospital Stay (HOSPITAL_COMMUNITY)
Admission: EM | Admit: 2011-11-08 | Discharge: 2011-11-14 | DRG: 812 | Disposition: A | Discharge status: Home / Self Care | Payer: Medicaid Other | Source: Ambulatory Visit | Attending: Internal Medicine | Admitting: Internal Medicine

## 2011-11-08 DIAGNOSIS — R112 Nausea with vomiting, unspecified: Secondary | ICD-10-CM

## 2011-11-08 DIAGNOSIS — K219 Gastro-esophageal reflux disease without esophagitis: Secondary | ICD-10-CM

## 2011-11-08 DIAGNOSIS — Z9089 Acquired absence of other organs: Secondary | ICD-10-CM

## 2011-11-08 DIAGNOSIS — Z888 Allergy status to other drugs, medicaments and biological substances status: Secondary | ICD-10-CM

## 2011-11-08 DIAGNOSIS — G43909 Migraine, unspecified, not intractable, without status migrainosus: Secondary | ICD-10-CM

## 2011-11-08 DIAGNOSIS — D5700 Hb-SS disease with crisis, unspecified: Principal | ICD-10-CM | POA: Diagnosis present

## 2011-11-08 DIAGNOSIS — R000 Tachycardia, unspecified: Secondary | ICD-10-CM

## 2011-11-08 DIAGNOSIS — F32A Depression, unspecified: Secondary | ICD-10-CM

## 2011-11-08 DIAGNOSIS — N760 Acute vaginitis: Secondary | ICD-10-CM

## 2011-11-08 DIAGNOSIS — M79651 Pain in right thigh: Secondary | ICD-10-CM | POA: Diagnosis present

## 2011-11-08 DIAGNOSIS — F439 Reaction to severe stress, unspecified: Secondary | ICD-10-CM

## 2011-11-08 DIAGNOSIS — D573 Sickle-cell trait: Secondary | ICD-10-CM

## 2011-11-08 DIAGNOSIS — D72829 Elevated white blood cell count, unspecified: Secondary | ICD-10-CM | POA: Diagnosis present

## 2011-11-08 DIAGNOSIS — F17200 Nicotine dependence, unspecified, uncomplicated: Secondary | ICD-10-CM

## 2011-11-08 DIAGNOSIS — F6389 Other impulse disorders: Secondary | ICD-10-CM

## 2011-11-08 DIAGNOSIS — M25559 Pain in unspecified hip: Secondary | ICD-10-CM | POA: Diagnosis present

## 2011-11-08 DIAGNOSIS — M549 Dorsalgia, unspecified: Secondary | ICD-10-CM

## 2011-11-08 DIAGNOSIS — E663 Overweight: Secondary | ICD-10-CM | POA: Diagnosis present

## 2011-11-08 DIAGNOSIS — R4586 Emotional lability: Secondary | ICD-10-CM | POA: Insufficient documentation

## 2011-11-08 DIAGNOSIS — F329 Major depressive disorder, single episode, unspecified: Secondary | ICD-10-CM | POA: Diagnosis present

## 2011-11-08 DIAGNOSIS — Z87891 Personal history of nicotine dependence: Secondary | ICD-10-CM

## 2011-11-08 DIAGNOSIS — F3289 Other specified depressive episodes: Secondary | ICD-10-CM | POA: Diagnosis present

## 2011-11-08 HISTORY — DX: Dorsalgia, unspecified: M54.9

## 2011-11-08 HISTORY — DX: Other chronic pain: G89.29

## 2011-11-08 HISTORY — DX: Dyspnea, unspecified: R06.00

## 2011-11-08 HISTORY — DX: Other forms of dyspnea: R06.09

## 2011-11-08 HISTORY — DX: Migraine, unspecified, not intractable, without status migrainosus: G43.909

## 2011-11-08 HISTORY — DX: Emotional lability: R45.86

## 2011-11-08 LAB — BASIC METABOLIC PANEL
CO2: 20 mEq/L (ref 19–32)
Chloride: 104 mEq/L (ref 96–112)
Creatinine, Ser: 0.57 mg/dL (ref 0.50–1.10)
Potassium: 3.8 mEq/L (ref 3.5–5.1)

## 2011-11-08 LAB — CBC
HCT: 30 % — ABNORMAL LOW (ref 36.0–46.0)
Hemoglobin: 10.2 g/dL — ABNORMAL LOW (ref 12.0–15.0)
MCH: 31.4 pg (ref 26.0–34.0)
MCV: 91.7 fL (ref 78.0–100.0)
Platelets: 353 10*3/uL (ref 150–400)
Platelets: 428 10*3/uL — ABNORMAL HIGH (ref 150–400)
RBC: 3.27 MIL/uL — ABNORMAL LOW (ref 3.87–5.11)
RBC: 3.25 MIL/uL — ABNORMAL LOW (ref 3.87–5.11)
WBC: 18.8 10*3/uL — ABNORMAL HIGH (ref 4.0–10.5)
WBC: 19.3 10*3/uL — ABNORMAL HIGH (ref 4.0–10.5)

## 2011-11-08 LAB — CK: Total CK: 59 U/L (ref 7–177)

## 2011-11-08 LAB — COMPREHENSIVE METABOLIC PANEL
AST: 21 U/L (ref 0–37)
BUN: 8 mg/dL (ref 6–23)
CO2: 23 mEq/L (ref 19–32)
Calcium: 9.1 mg/dL (ref 8.4–10.5)
Creatinine, Ser: 0.58 mg/dL (ref 0.50–1.10)
GFR calc non Af Amer: >90 mL/min (ref >90)

## 2011-11-08 LAB — PREGNANCY, URINE: Preg Test, Ur: NEGATIVE

## 2011-11-08 MED ORDER — HYDROMORPHONE HCL PF 1 MG/ML IJ SOLN
1.0000 mg | INTRAMUSCULAR | Status: DC | PRN
  Administered 2011-11-08: 1 mg via INTRAVENOUS
  Filled 2011-11-08: qty 2

## 2011-11-08 MED ORDER — ONDANSETRON HCL 4 MG PO TABS: 4.0000 mg | ORAL_TABLET | Freq: Four times a day (QID) | ORAL | Status: DC | PRN

## 2011-11-08 MED ORDER — VITAMIN D3 25 MCG (1000 UNIT) PO TABS
1000.0000 [IU] | ORAL_TABLET | Freq: Every day | ORAL | Status: DC
  Administered 2011-11-08 – 2011-11-14 (×7): 1000 [IU] via ORAL
  Filled 2011-11-08 (×10): qty 1

## 2011-11-08 MED ORDER — HYDROMORPHONE HCL PF 1 MG/ML IJ SOLN
1.0000 mg | INTRAMUSCULAR | Status: DC | PRN
  Administered 2011-11-08 – 2011-11-09 (×8): 2 mg via INTRAVENOUS
  Administered 2011-11-09: 1 mg via INTRAVENOUS
  Administered 2011-11-09 – 2011-11-14 (×37): 2 mg via INTRAVENOUS
  Filled 2011-11-08 (×4): qty 2
  Filled 2011-11-08: qty 1
  Filled 2011-11-08 (×39): qty 2
  Filled 2011-11-08: qty 1
  Filled 2011-11-08 (×5): qty 2

## 2011-11-08 MED ORDER — HYDROMORPHONE HCL PF 2 MG/ML IJ SOLN
2.0000 mg | Freq: Once | INTRAMUSCULAR | Status: AC
  Administered 2011-11-08: 2 mg via INTRAVENOUS
  Filled 2011-11-08: qty 1

## 2011-11-08 MED ORDER — DIPHENHYDRAMINE HCL 50 MG/ML IJ SOLN
25.0000 mg | Freq: Once | INTRAMUSCULAR | Status: AC
  Administered 2011-11-08: 25 mg via INTRAVENOUS
  Filled 2011-11-08: qty 1

## 2011-11-08 MED ORDER — SODIUM CHLORIDE 0.9 % IV SOLN
INTRAVENOUS | Status: DC
  Administered 2011-11-08 – 2011-11-09 (×3): via INTRAVENOUS

## 2011-11-08 MED ORDER — HYDROXYUREA 500 MG PO CAPS
1000.0000 mg | ORAL_CAPSULE | Freq: Every day | ORAL | Status: DC
  Administered 2011-11-08 – 2011-11-14 (×7): 1000 mg via ORAL
  Filled 2011-11-08 (×8): qty 2

## 2011-11-08 MED ORDER — ONDANSETRON HCL 4 MG/2ML IJ SOLN
4.0000 mg | Freq: Four times a day (QID) | INTRAMUSCULAR | Status: DC | PRN
  Administered 2011-11-09 – 2011-11-12 (×5): 4 mg via INTRAVENOUS
  Filled 2011-11-08 (×5): qty 2

## 2011-11-08 MED ORDER — PANTOPRAZOLE SODIUM 40 MG PO TBEC
40.0000 mg | DELAYED_RELEASE_TABLET | Freq: Every day | ORAL | Status: DC
  Administered 2011-11-08 – 2011-11-14 (×7): 40 mg via ORAL
  Filled 2011-11-08 (×6): qty 1

## 2011-11-08 MED ORDER — OXYCODONE HCL 5 MG PO TABS
10.0000 mg | ORAL_TABLET | Freq: Four times a day (QID) | ORAL | Status: DC | PRN
  Administered 2011-11-08 – 2011-11-10 (×5): 10 mg via ORAL
  Administered 2011-11-11 – 2011-11-13 (×3): 5 mg via ORAL
  Administered 2011-11-14: 10 mg via ORAL
  Filled 2011-11-08 (×2): qty 2
  Filled 2011-11-08: qty 1
  Filled 2011-11-08 (×2): qty 2
  Filled 2011-11-08: qty 1
  Filled 2011-11-08 (×4): qty 2

## 2011-11-08 MED ORDER — OXYCODONE HCL 10 MG PO TABS: 10.0000 mg | ORAL_TABLET | Freq: Four times a day (QID) | ORAL | Status: DC | PRN

## 2011-11-08 MED ORDER — ACETAMINOPHEN 325 MG PO TABS
650.0000 mg | ORAL_TABLET | Freq: Four times a day (QID) | ORAL | Status: DC | PRN
  Filled 2011-11-08: qty 2

## 2011-11-08 MED ORDER — LEVOFLOXACIN 750 MG PO TABS
750.0000 mg | ORAL_TABLET | Freq: Every day | ORAL | Status: DC
  Administered 2011-11-08 – 2011-11-14 (×7): 750 mg via ORAL
  Filled 2011-11-08 (×10): qty 1

## 2011-11-08 MED ORDER — ACETAMINOPHEN 650 MG RE SUPP: 650.0000 mg | Freq: Four times a day (QID) | RECTAL | Status: DC | PRN

## 2011-11-08 MED ORDER — SODIUM CHLORIDE 0.9 % IV SOLN
INTRAVENOUS | Status: DC
  Administered 2011-11-08: 16:00:00 via INTRAVENOUS

## 2011-11-08 MED ORDER — CYCLOBENZAPRINE HCL 10 MG PO TABS
5.0000 mg | ORAL_TABLET | Freq: Two times a day (BID) | ORAL | Status: DC | PRN
  Administered 2011-11-12 – 2011-11-14 (×4): 5 mg via ORAL
  Filled 2011-11-08 (×3): qty 1
  Filled 2011-11-08: qty 2
  Filled 2011-11-08: qty 1

## 2011-11-08 MED ORDER — ONDANSETRON HCL 4 MG/2ML IJ SOLN
4.0000 mg | Freq: Once | INTRAMUSCULAR | Status: AC
  Administered 2011-11-08: 4 mg via INTRAVENOUS
  Filled 2011-11-08: qty 2

## 2011-11-08 NOTE — Progress Notes (Signed)
Utilization review completed. Amran Malter, RN, BSN. 

## 2011-11-08 NOTE — ED Provider Notes (Signed)
History     CSN: [id number]  Arrival date & time 11/08/11  0215   First MD Initiated Contact with Patient 11/08/11 [phone]      Chief Complaint  Patient presents with  . Sickle Cell Pain Crisis    (Consider location/radiation/quality/duration/timing/severity/associated sxs/prior treatment) HPI History provided by patient. Right leg pain started today. Feels like typical sickle cell crisis pain. No fevers or chills. No trauma. No rash. No insect or tick bites. No weakness or numbness. No rash or swelling. Pain is sharp in quality and not radiating from right leg. Moderate severity. Continuous pain. Unchanged by medications at home. Past Medical History  Diagnosis Date  . H/O: 1 miscarriage 03/22/2011  . 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 Active smoker 08/09/2010  . Depression 01/06/2011  . GERD (gastroesophageal reflux disease) 02/17/2011  . Trichotillomania     h/o  . Blood transfusion   . Sickle cell anemia with crisis     Past Surgical History  Procedure Date  . Cholecystectomy 05/2010  . Dilation and curettage of uterus 02/20/11    History reviewed. No pertinent family history.  History  Substance Use Topics  . Smoking status: Former Smoker    Quit date: 07/15/2011  . Smokeless tobacco: Never Used  . Alcohol Use: No    OB History    Grav Para Term Preterm Abortions TAB SAB Ect Mult Living   1    1           Review of Systems  Constitutional: Negative for fever and chills.  HENT: Negative for neck pain and neck stiffness.   Eyes: Negative for pain.  Respiratory: Negative for shortness of breath.   Cardiovascular: Negative for chest pain.  Gastrointestinal: Negative for abdominal pain.  Genitourinary: Negative for dysuria.  Musculoskeletal: Negative for back pain.  Skin: Negative for rash and wound.  Neurological: Negative for headaches.  All other systems reviewed and are negative.    Allergies  Ketorolac tromethamine and Toradol  Home Medications   Current  Outpatient Rx  Name Route Sig Dispense Refill  . VITAMIN D 1000 UNITS PO TABS Oral Take 1,000 Units by mouth daily.    . CYCLOBENZAPRINE HCL 5 MG PO TABS Oral Take 5 mg by mouth 2 (two) times daily as needed. Patient used this medication for her muscle spasms.     Marland Kitchen HYDROXYUREA 500 MG PO CAPS Oral Take 1,000 mg by mouth daily. May take with food to minimize GI side effects.    . IBUPROFEN 200 MG PO TABS Oral Take 600 mg by mouth daily. Patient used this medication for her pain.    Marland Kitchen LEVOFLOXACIN 750 MG PO TABS Oral Take 1 tablet (750 mg total) by mouth daily. 7 tablet 0  . OXYCODONE HCL 10 MG PO TABS Oral Take 1 tablet (10 mg total) by mouth 4 (four) times daily as needed. For pain. 60 tablet 0  . PANTOPRAZOLE SODIUM 40 MG PO TBEC Oral Take 40 mg by mouth daily.      BP 109/81  Pulse 100  Temp 98.2 F (36.8 C) (Oral)  Resp 17  SpO2 99%  LMP 10/17/2011  Physical Exam  Constitutional: She is oriented to person, place, and time. She appears well-developed and well-nourished.  HENT:  Head: Normocephalic and atraumatic.  Eyes: Conjunctivae and EOM are normal. Pupils are equal, round, and reactive to light.  Neck: Trachea normal. Neck supple. No thyromegaly present.  Cardiovascular: Normal rate, regular rhythm, S1 normal, S2 normal and normal  pulses.     No systolic murmur is present   No diastolic murmur is present  Pulses:      Radial pulses are 2+ on the right side, and 2+ on the left side.  Pulmonary/Chest: Effort normal and breath sounds normal. She has no wheezes. She has no rhonchi. She has no rales. She exhibits no tenderness.  Abdominal: Soft. Normal appearance and bowel sounds are normal. There is no tenderness. There is no CVA tenderness and negative Murphy's sign.  Musculoskeletal:       Right lower extremity localized discomfort to right thigh area without erythema, swelling or point tenderness. Distal neurovascular intact. BLE:s Calves nontender, no cords or erythema,  negative Homans sign  Neurological: She is alert and oriented to person, place, and time. She has normal strength. No cranial nerve deficit or sensory deficit. GCS eye subscore is 4. GCS verbal subscore is 5. GCS motor subscore is 6.  Skin: Skin is warm and dry. No rash noted. She is not diaphoretic.  Psychiatric: Her speech is normal.       Cooperative and appropriate    ED Course  Procedures (including critical care time)  Results for orders placed during the hospital encounter of 11/08/11  CBC      Component Value Range   WBC 18.8 (*) 4.0 - 10.5 K/uL   RBC 3.27 (*) 3.87 - 5.11 MIL/uL   Hemoglobin 10.5 (*) 12.0 - 15.0 g/dL   HCT 78.2 (*) 95.6 - 21.3 %   MCV 91.7  78.0 - 100.0 fL   MCH 32.1  26.0 - 34.0 pg   MCHC 35.0  30.0 - 36.0 g/dL   RDW 08.6  57.8 - 46.9 %   Platelets 353  150 - 400 K/uL  BASIC METABOLIC PANEL      Component Value Range   Sodium 139  135 - 145 mEq/L   Potassium 3.8  3.5 - 5.1 mEq/L   Chloride 104  96 - 112 mEq/L   CO2 20  19 - 32 mEq/L   Glucose, Bld 86  70 - 99 mg/dL   BUN 9  6 - 23 mg/dL   Creatinine, Ser 6.29  0.50 - 1.10 mg/dL   Calcium 9.2  8.4 - 52.8 mg/dL   GFR calc non Af Amer >90  >90 mL/min   GFR calc Af Amer >90  >90 mL/min   IV Dilaudid. IV Zofran. IV Benadryl. IV fluids. Oxygen provided.  Serial assessments with continuous pain. Labs reviewed as above. Medicine consult for admission. Case discussed with hospitalist who agrees to admission - Dr Blima Rich    MDM   Sickle cell crisis persistent despite IV narcotics. Plan admit        Sunnie Nielsen, MD 11/08/11 [phone]

## 2011-11-08 NOTE — H&P (Signed)
[name] is an 21 y.o. female.   PCP - Dr.Dean. Chief Complaint: Right thigh pain. HPI: 21 year-old female with known history of sickle cell disease presented to the ER last evening because of worsening pain in the right thigh. Patient states she had gone for swimming last evening after which she started developing pain in the right thigh which had been gradually worsening. In the ER despite giving multiple does of pain relief medication patient still has pain and is being admitted for pain relief. Patient denies any trauma fever or chills. The pain is more specific to the right thigh area and also has mild pain in the right elbow. Patient denies any chest pain or shortness of breath nausea vomiting or abdominal pain.  Past Medical History  Diagnosis Date  . H/O: 1 miscarriage 03/22/2011  . 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 Active smoker 08/09/2010  . Depression 01/06/2011  . GERD (gastroesophageal reflux disease) 02/17/2011  . Trichotillomania     h/o  . Blood transfusion   . Sickle cell anemia with crisis     Past Surgical History  Procedure Date  . Cholecystectomy 05/2010  . Dilation and curettage of uterus 02/20/11    History reviewed. No pertinent family history. Social History:  reports that she quit smoking about 3 months ago. She has never used smokeless tobacco. She reports that she does not drink alcohol or use illicit drugs.  Allergies:  Allergies  Allergen Reactions  . Ketorolac Tromethamine Hives and Itching  . Toradol (Ketorolac Tromethamine) Hives and Itching     (Not in a hospital admission)  Results for orders placed during the hospital encounter of 11/08/11 (from the past 48 hour(s))  CBC     Status: Abnormal   Collection Time   11/08/11  2:31 AM      Component Value Range Comment   WBC 18.8 (*) 4.0 - 10.5 K/uL    RBC 3.27 (*) 3.87 - 5.11 MIL/uL    Hemoglobin 10.5 (*) 12.0 - 15.0 g/dL    HCT 40.9 (*) 81.1 - 46.0 %    MCV 91.7  78.0 - 100.0 fL    MCH 32.1   26.0 - 34.0 pg    MCHC 35.0  30.0 - 36.0 g/dL    RDW 91.4  78.2 - 95.6 %    Platelets 353  150 - 400 K/uL   BASIC METABOLIC PANEL     Status: Normal   Collection Time   11/08/11  2:31 AM      Component Value Range Comment   Sodium 139  135 - 145 mEq/L    Potassium 3.8  3.5 - 5.1 mEq/L    Chloride 104  96 - 112 mEq/L    CO2 20  19 - 32 mEq/L    Glucose, Bld 86  70 - 99 mg/dL    BUN 9  6 - 23 mg/dL    Creatinine, Ser 2.13  0.50 - 1.10 mg/dL    Calcium 9.2  8.4 - 08.6 mg/dL    GFR calc non Af Amer >90  >90 mL/min    GFR calc Af Amer >90  >90 mL/min    No results found.  Review of Systems  Constitutional: Negative.   HENT: Negative.   Eyes: Negative.   Respiratory: Negative.   Cardiovascular: Negative.   Musculoskeletal:       Pain in the right thigh.  Skin: Negative.     Blood pressure 109/81, pulse 100, temperature 98.2 F (36.8  C), temperature source Oral, resp. rate 17, last menstrual period 10/17/2011, SpO2 99.00%, not currently breastfeeding. Physical Exam  Constitutional: She is oriented to person, place, and time. She appears well-developed and well-nourished. No distress.  HENT:  Head: Normocephalic and atraumatic.  Right Ear: External ear normal.  Left Ear: External ear normal.  Nose: Nose normal.  Mouth/Throat: Oropharynx is clear and moist. No oropharyngeal exudate.  Eyes: Conjunctivae are normal. Pupils are equal, round, and reactive to light. Right eye exhibits no discharge. Left eye exhibits no discharge. No scleral icterus.  Neck: Normal range of motion. Neck supple.  Cardiovascular: Normal rate and regular rhythm.   Respiratory: Effort normal and breath sounds normal. No respiratory distress. She has no wheezes. She has no rales.  GI: Soft. Bowel sounds are normal. She exhibits no distension. There is no tenderness. There is no rebound.  Musculoskeletal:       Mild tenderness and swelling of right thigh.  Neurological: She is alert and oriented to person,  place, and time.       Moves all extremities.  Skin: Skin is warm and dry. She is not diaphoretic.  Psychiatric: Her behavior is normal.     Assessment/Plan #1. Right thigh pain and history of sickle cell disease most likely from sickle cell pain crisis - continue with IV pain relief medication and hydration. Patient does have tenderness on exam right thigh. I will get a CT of the right thigh to make sure there is no myositis. Check CK levels.  #2. Sickle cell anemia - hemoglobin is stable at this time. #3. Leukocytosis probably reactionary at this time patient is not febrile.  Patient is on Levaquin and I am not sure what patient is on for.  CODE STATUS - full code. Patient is admitted under Dr. August Saucer service.   Eduard Clos 11/08/2011, 6:23 AM

## 2011-11-09 MED ORDER — SODIUM CHLORIDE 0.45 % IV SOLN
INTRAVENOUS | Status: DC
  Administered 2011-11-09: via INTRAVENOUS
  Administered 2011-11-10 – 2011-11-11 (×2): 75 mL/h via INTRAVENOUS
  Administered 2011-11-11 – 2011-11-13 (×2): via INTRAVENOUS

## 2011-11-09 NOTE — Progress Notes (Signed)
Subjective:  Patient reports she's feeling better today than yesterday. She still has right thigh pain and right groin pain. She rates her pain as a 6/10. She feels this was brought on by her going swimming in a cool pool. She denies any vaginal complaints otherwise. She's had a similar bout of pain several years ago in the same area. No chest pains or shortness of breath. She is otherwise doing well.   Allergies  Allergen Reactions  . Ketorolac Tromethamine Hives and Itching  . Toradol (Ketorolac Tromethamine) Hives and Itching   Current Facility-Administered Medications  Medication Dose Route Frequency Provider Last Rate Last Dose  . 0.9 %  sodium chloride infusion   Intravenous Continuous Sunnie Nielsen, MD 125 mL/hr at 11/09/11 1404    . 0.9 %  sodium chloride infusion   Intravenous Continuous Eduard Clos, MD 125 mL/hr at 11/08/11 1623    . acetaminophen (TYLENOL) tablet 650 mg  650 mg Oral Q6H PRN Eduard Clos, MD       Or  . acetaminophen (TYLENOL) suppository 650 mg  650 mg Rectal Q6H PRN Eduard Clos, MD      . cholecalciferol (VITAMIN D) tablet 1,000 Units  1,000 Units Oral Daily Eduard Clos, MD   1,000 Units at 11/09/11 1120  . cyclobenzaprine (FLEXERIL) tablet 5 mg  5 mg Oral BID PRN Eduard Clos, MD      . HYDROmorphone (DILAUDID) injection 1-2 mg  1-2 mg Intravenous Q2H PRN Gwenyth Bender, MD   2 mg at 11/09/11 2016  . hydroxyurea (HYDREA) capsule 1,000 mg  1,000 mg Oral Q breakfast Eduard Clos, MD   1,000 mg at 11/09/11 1001  . levofloxacin (LEVAQUIN) tablet 750 mg  750 mg Oral Daily Eduard Clos, MD   750 mg at 11/09/11 1121  . ondansetron (ZOFRAN) tablet 4 mg  4 mg Oral Q6H PRN Eduard Clos, MD       Or  . ondansetron [facility] injection 4 mg  4 mg Intravenous Q6H PRN Eduard Clos, MD   4 mg at 11/09/11 1005  . oxyCODONE (Oxy IR/ROXICODONE) immediate release tablet 10 mg  10 mg Oral QID PRN Eduard Clos, MD    10 mg at 11/09/11 1004  . pantoprazole (PROTONIX) EC tablet 40 mg  40 mg Oral Daily Eduard Clos, MD   40 mg at 11/09/11 1001    Objective: Blood pressure 113/61, pulse 102, temperature 98.8 F (37.1 C), temperature source Oral, resp. rate 18, last menstrual period 11/04/2011, SpO2 97.00%, not currently breastfeeding.  Well-developed well-nourished black female presently in no acute distress. HEENT: No sinus tenderness. No sclera icterus. NECK: No enlarged thyroid. No posterior cervical nodes. LUNGS: Clear to auscultation. No vocal fremitus. CV: Normal S1, S2 without S3. ABD: Minimal right groin tenderness. MSK: Tenderness in right anterior thigh. No increased warmth. No masses. Negative Homans. NEURO: Intact.  Lab results: Results for orders placed during the hospital encounter of 11/08/11 (from the past 48 hour(s))  CBC     Status: Abnormal   Collection Time   11/08/11  2:31 AM      Component Value Range Comment   WBC 18.8 (*) 4.0 - 10.5 K/uL    RBC 3.27 (*) 3.87 - 5.11 MIL/uL    Hemoglobin 10.5 (*) 12.0 - 15.0 g/dL    HCT 29.5 (*) 62.1 - 46.0 %    MCV 91.7  78.0 - 100.0 fL    MCH  32.1  26.0 - 34.0 pg    MCHC 35.0  30.0 - 36.0 g/dL    RDW 86.5  78.4 - 69.6 %    Platelets 353  150 - 400 K/uL   BASIC METABOLIC PANEL     Status: Normal   Collection Time   11/08/11  2:31 AM      Component Value Range Comment   Sodium 139  135 - 145 mEq/L    Potassium 3.8  3.5 - 5.1 mEq/L    Chloride 104  96 - 112 mEq/L    CO2 20  19 - 32 mEq/L    Glucose, Bld 86  70 - 99 mg/dL    BUN 9  6 - 23 mg/dL    Creatinine, Ser 2.95  0.50 - 1.10 mg/dL    Calcium 9.2  8.4 - 28.4 mg/dL    GFR calc non Af Amer >90  >90 mL/min    GFR calc Af Amer >90  >90 mL/min   CK     Status: Normal   Collection Time   11/08/11  5:52 AM      Component Value Range Comment   Total CK 59  7 - 177 U/L   COMPREHENSIVE METABOLIC PANEL     Status: Abnormal   Collection Time   11/08/11  8:44 AM      Component  Value Range Comment   Sodium 134 (*) 135 - 145 mEq/L    Potassium 3.9  3.5 - 5.1 mEq/L    Chloride 101  96 - 112 mEq/L    CO2 23  19 - 32 mEq/L    Glucose, Bld 92  70 - 99 mg/dL    BUN 8  6 - 23 mg/dL    Creatinine, Ser 1.32  0.50 - 1.10 mg/dL    Calcium 9.1  8.4 - 44.0 mg/dL    Total Protein 7.2  6.0 - 8.3 g/dL    Albumin 4.0  3.5 - 5.2 g/dL    AST 21  0 - 37 U/L    ALT 13  0 - 35 U/L    Alkaline Phosphatase 57  39 - 117 U/L    Total Bilirubin 1.6 (*) 0.3 - 1.2 mg/dL    GFR calc non Af Amer >90  >90 mL/min    GFR calc Af Amer >90  >90 mL/min   CBC     Status: Abnormal   Collection Time   11/08/11 10:44 AM      Component Value Range Comment   WBC 19.3 (*) 4.0 - 10.5 K/uL    RBC 3.25 (*) 3.87 - 5.11 MIL/uL    Hemoglobin 10.2 (*) 12.0 - 15.0 g/dL    HCT 10.2 (*) 72.5 - 46.0 %    MCV 90.2  78.0 - 100.0 fL    MCH 31.4  26.0 - 34.0 pg    MCHC 34.8  30.0 - 36.0 g/dL    RDW 36.6  44.0 - 34.7 %    Platelets 428 (*) 150 - 400 K/uL   PREGNANCY, URINE     Status: Normal   Collection Time   11/08/11 12:18 PM      Component Value Range Comment   Preg Test, Ur NEGATIVE  NEGATIVE     Studies/Results: Ct Femur Right Wo Contrast  11/08/2011  *RADIOLOGY REPORT*  Clinical Data: Sickle cell crisis.  Severe right thigh pain.  CT OF THE RIGHT FEMUR WITHOUT CONTRAST  Comparison: 06/21/2011.  Findings: Vague sclerosis is  present in the distal femoral metaphysis.  In a patient with history of sickle cell anemia, this could represent small area of infarction.  There is no AVN.  Mild subchondral sclerosis of the right acetabular roof could be associated with old bone infarct as well.  Soft tissues appear normal and symmetric.  There is no soft tissue fluid collection or inflammatory change.  Muscular compartments appear within normal limits.  No distortion of fat planes.  The right thigh appears symmetric when compared to the left thigh. There is no inguinal adenopathy.  The visualized visceral pelvis  appears within normal limits.  IMPRESSION:  1.  Normal soft tissues of the right thigh for noncontrast CT. 2.  Small areas of sclerosis in the right acetabular roof and distal femoral metaphysis may represent small bone infarcts in a patient with history of sickle cell anemia.  Original Report Authenticated By: Andreas Newport, M.D.    Patient Active Problem List  Diagnosis  . SICKLE CELL ANEMIA  . TRICHOTILLOMANIA  . Active smoker  . Back pain  . Depression  . GERD (gastroesophageal reflux disease)  . Contraception management  . Stress  . Overweight  . Vaso-occlusive sickle cell crisis  . 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 Right leg pain secondary to #1 versus muscle skeletal strain. Normal CPK. Gastroesophageal reflux disease. History depression.   Plan: Continue present therapy. Change IV fluids to half-normal saline at reduced rate. Followup CPK and aldolase, C-reactive protein in a.m. Discharge home soon if stable.   August Saucer, Macklyn Glandon 11/09/2011 9:22 PM

## 2011-11-10 LAB — COMPREHENSIVE METABOLIC PANEL
ALT: 12 U/L (ref 0–35)
AST: 23 U/L (ref 0–37)
Albumin: 3.9 g/dL (ref 3.5–5.2)
Alkaline Phosphatase: 67 U/L (ref 39–117)
CO2: 24 mEq/L (ref 19–32)
Chloride: 102 mEq/L (ref 96–112)
GFR calc non Af Amer: >90 mL/min (ref >90)
Potassium: 3.9 mEq/L (ref 3.5–5.1)
Sodium: 139 mEq/L (ref 135–145)
Total Bilirubin: 1.5 mg/dL — ABNORMAL HIGH (ref 0.3–1.2)

## 2011-11-10 LAB — CBC
MCH: 31.4 pg (ref 26.0–34.0)
MCHC: 34.9 g/dL (ref 30.0–36.0)
MCV: 90.1 fL (ref 78.0–100.0)
Platelets: 477 10*3/uL — ABNORMAL HIGH (ref 150–400)

## 2011-11-10 LAB — CK: Total CK: 61 U/L (ref 7–177)

## 2011-11-10 MED ORDER — MAGNESIUM CITRATE PO SOLN
1.0000 | Freq: Once | ORAL | Status: AC
  Administered 2011-11-10: 1 via ORAL
  Filled 2011-11-10: qty 296

## 2011-11-10 NOTE — Progress Notes (Signed)
Subjective:  Patient continues to have more pain than she expected. She rates the pain as a 7/10. She does not feel she can manage her pain at home as of yet. She continues to have pain in her right hip and leg. Denies chest pains or shortness of breath. She's experienced problems with nausea and vomiting. She has not had a bowel movement as of yet. No other new complaints.   Allergies  Allergen Reactions  . Ketorolac Tromethamine Hives and Itching  . Toradol (Ketorolac Tromethamine) Hives and Itching   Current Facility-Administered Medications  Medication Dose Route Frequency Provider Last Rate Last Dose  . 0.45 % sodium chloride infusion   Intravenous Continuous Gwenyth Bender, MD 75 mL/hr at 11/10/11 0708 75 mL/hr at 11/10/11 0708  . acetaminophen (TYLENOL) tablet 650 mg  650 mg Oral Q6H PRN Eduard Clos, MD       Or  . acetaminophen (TYLENOL) suppository 650 mg  650 mg Rectal Q6H PRN Eduard Clos, MD      . cholecalciferol (VITAMIN D) tablet 1,000 Units  1,000 Units Oral Daily Eduard Clos, MD   1,000 Units at 11/10/11 0910  . cyclobenzaprine (FLEXERIL) tablet 5 mg  5 mg Oral BID PRN Eduard Clos, MD      . HYDROmorphone (DILAUDID) injection 1-2 mg  1-2 mg Intravenous Q2H PRN Gwenyth Bender, MD   2 mg at 11/10/11 2218  . hydroxyurea (HYDREA) capsule 1,000 mg  1,000 mg Oral Q breakfast Eduard Clos, MD   1,000 mg at 11/10/11 0909  . levofloxacin (LEVAQUIN) tablet 750 mg  750 mg Oral Daily Eduard Clos, MD   750 mg at 11/10/11 0910  . magnesium citrate solution 1 Bottle  1 Bottle Oral Once Gwenyth Bender, MD   1 Bottle at 11/10/11 2259  . ondansetron (ZOFRAN) tablet 4 mg  4 mg Oral Q6H PRN Eduard Clos, MD       Or  . ondansetron [facility]) injection 4 mg  4 mg Intravenous Q6H PRN Eduard Clos, MD   4 mg at 11/10/11 1610  . oxyCODONE (Oxy IR/ROXICODONE) immediate release tablet 10 mg  10 mg Oral QID PRN Eduard Clos, MD   10 mg at  11/10/11 1418  . pantoprazole (PROTONIX) EC tablet 40 mg  40 mg Oral Daily Eduard Clos, MD   40 mg at 11/10/11 0913    Objective: Blood pressure 120/56, pulse 108, temperature 98.9 F (37.2 C), temperature source Oral, resp. rate 18, last menstrual period 11/04/2011, SpO2 99.00%, not currently breastfeeding.  Well-developed well-nourished black female in mild distress. HEENT:no sinus tenderness. No sclera icterus. NECK:no enlarged thyroid. No posterior cervical nodes. LUNGS:clear to auscultation. No vocal fremitus. RU:EAVWUJ S1, S2 without S3. WJX:BJYN distention. Dullness lower quadrant. WGN:FAOZH hip and right anterior thigh with tenderness to palpation. Negative Homans bilaterally. NEURO:intact.  Lab results: Results for orders placed during the hospital encounter of 11/08/11 (from the past 48 hour(s))  COMPREHENSIVE METABOLIC PANEL     Status: Abnormal   Collection Time   11/10/11  4:56 AM      Component Value Range Comment   Sodium 139  135 - 145 mEq/L    Potassium 3.9  3.5 - 5.1 mEq/L    Chloride 102  96 - 112 mEq/L    CO2 24  19 - 32 mEq/L    Glucose, Bld 113 (*) 70 - 99 mg/dL    BUN 6  6 -  23 mg/dL    Creatinine, Ser 4.09  0.50 - 1.10 mg/dL    Calcium 9.3  8.4 - 81.1 mg/dL    Total Protein 7.2  6.0 - 8.3 g/dL    Albumin 3.9  3.5 - 5.2 g/dL    AST 23  0 - 37 U/L    ALT 12  0 - 35 U/L    Alkaline Phosphatase 67  39 - 117 U/L    Total Bilirubin 1.5 (*) 0.3 - 1.2 mg/dL    GFR calc non Af Amer >90  >90 mL/min    GFR calc Af Amer >90  >90 mL/min   CBC     Status: Abnormal   Collection Time   11/10/11  4:56 AM      Component Value Range Comment   WBC 11.4 (*) 4.0 - 10.5 K/uL    RBC 3.34 (*) 3.87 - 5.11 MIL/uL    Hemoglobin 10.5 (*) 12.0 - 15.0 g/dL    HCT 91.4 (*) 78.2 - 46.0 %    MCV 90.1  78.0 - 100.0 fL    MCH 31.4  26.0 - 34.0 pg    MCHC 34.9  30.0 - 36.0 g/dL    RDW 95.6  21.3 - 08.6 %    Platelets 477 (*) 150 - 400 K/uL   CK     Status: Normal    Collection Time   11/10/11  4:56 AM      Component Value Range Comment   Total CK 61  7 - 177 U/L   C-REACTIVE PROTEIN     Status: Abnormal   Collection Time   11/10/11  4:56 AM      Component Value Range Comment   CRP 1.18 (*) <0.60 mg/dL     Studies/Results: No results found.  Patient Active Problem List  Diagnosis  . SICKLE CELL ANEMIA  . TRICHOTILLOMANIA  . Active smoker  . Back pain  . Depression  . GERD (gastroesophageal reflux disease)  . Contraception management  . Stress  . Overweight  . Vaso-occlusive sickle cell crisis  . Right thigh pain    Impression: Sickle cell crisis resolving. Right thigh and femur pain. Evidence for previous bony infarct. This most likely was exacerbated by her recent physical activity. Colonic dysfunction with recent constipation. Elevated C. Reactive protein secondary to #1. Gastroesophageal reflux disease. History of tobacco abuse.   Plan: Continue therapy for additional 24 hours. Cathartics as needed. Progressed to discharge as tolerated.  [name], [name] 11/10/2011 11:57 PM

## 2011-11-11 LAB — COMPREHENSIVE METABOLIC PANEL
ALT: 11 U/L (ref 0–35)
AST: 20 U/L (ref 0–37)
CO2: 27 mEq/L (ref 19–32)
Calcium: 9.4 mg/dL (ref 8.4–10.5)
Creatinine, Ser: 0.82 mg/dL (ref 0.50–1.10)
GFR calc Af Amer: >90 mL/min (ref >90)
GFR calc non Af Amer: >90 mL/min (ref >90)
Glucose, Bld: 99 mg/dL (ref 70–99)
Sodium: 139 mEq/L (ref 135–145)
Total Protein: 7.3 g/dL (ref 6.0–8.3)

## 2011-11-11 LAB — CBC
MCH: 31.3 pg (ref 26.0–34.0)
MCHC: 34.5 g/dL (ref 30.0–36.0)
MCV: 90.7 fL (ref 78.0–100.0)
Platelets: 481 10*3/uL — ABNORMAL HIGH (ref 150–400)
RBC: 3.35 MIL/uL — ABNORMAL LOW (ref 3.87–5.11)

## 2011-11-11 MED ORDER — MAGNESIUM CITRATE PO SOLN
1.0000 | Freq: Once | ORAL | Status: AC
  Administered 2011-11-11: 1 via ORAL
  Filled 2011-11-11: qty 296

## 2011-11-11 NOTE — Progress Notes (Signed)
Subjective:  Patient feeling better. She now rates the pain as a 6/10. She feels that she can manage his pain at home by tomorrow. She did ambulate around the room. No new complaints. Patient did have a bowel movement as well.   Allergies  Allergen Reactions  . Ketorolac Tromethamine Hives and Itching  . Toradol (Ketorolac Tromethamine) Hives and Itching   Current Facility-Administered Medications  Medication Dose Route Frequency Provider Last Rate Last Dose  . 0.45 % sodium chloride infusion   Intravenous Continuous Gwenyth Bender, MD 75 mL/hr at 11/11/11 1649    . acetaminophen (TYLENOL) tablet 650 mg  650 mg Oral Q6H PRN Eduard Clos, MD       Or  . acetaminophen (TYLENOL) suppository 650 mg  650 mg Rectal Q6H PRN Eduard Clos, MD      . cholecalciferol (VITAMIN D) tablet 1,000 Units  1,000 Units Oral Daily Eduard Clos, MD   1,000 Units at 11/11/11 0951  . cyclobenzaprine (FLEXERIL) tablet 5 mg  5 mg Oral BID PRN Eduard Clos, MD      . HYDROmorphone (DILAUDID) injection 1-2 mg  1-2 mg Intravenous Q2H PRN Gwenyth Bender, MD   2 mg at 11/11/11 1642  . hydroxyurea (HYDREA) capsule 1,000 mg  1,000 mg Oral Q breakfast Eduard Clos, MD   1,000 mg at 11/11/11 9562  . levofloxacin (LEVAQUIN) tablet 750 mg  750 mg Oral Daily Eduard Clos, MD   750 mg at 11/11/11 0951  . magnesium citrate solution 1 Bottle  1 Bottle Oral Once Gwenyth Bender, MD   1 Bottle at 11/10/11 2259  . magnesium citrate solution 1 Bottle  1 Bottle Oral Once Gwenyth Bender, MD   1 Bottle at 11/11/11 1443  . ondansetron (ZOFRAN) tablet 4 mg  4 mg Oral Q6H PRN Eduard Clos, MD       Or  . ondansetron [facility]) injection 4 mg  4 mg Intravenous Q6H PRN Eduard Clos, MD   4 mg at 11/11/11 0128  . oxyCODONE (Oxy IR/ROXICODONE) immediate release tablet 10 mg  10 mg Oral QID PRN Eduard Clos, MD   5 mg at 11/11/11 1442  . pantoprazole (PROTONIX) EC tablet 40 mg  40 mg Oral Daily  Eduard Clos, MD   40 mg at 11/11/11 0951    Objective: Blood pressure 108/70, pulse 95, temperature 98.4 F (36.9 C), temperature source Oral, resp. rate 18, last menstrual period 11/04/2011, SpO2 100.00%, not currently breastfeeding.  Well-developed well-nourished black female drowsy though in no acute distress. HEENT:no sinus tenderness. No sclera icterus. NECK:no enlarged thyroid. No posterior cervical nodes. LUNGS:clear to auscultation. ZH:YQMVHQ S1, S2 without S3. ABD:no tenderness. ION:GEXBMWUX Hom[place]. Right thigh tenderness persists. Decreased however. NEURO:intact.  Lab results: Results for orders placed during the hospital encounter of 11/08/11 (from the past 48 hour(s))  COMPREHENSIVE METABOLIC PANEL     Status: Abnormal   Collection Time   11/10/11  4:56 AM      Component Value Range Comment   Sodium 139  135 - 145 mEq/L    Potassium 3.9  3.5 - 5.1 mEq/L    Chloride 102  96 - 112 mEq/L    CO2 24  19 - 32 mEq/L    Glucose, Bld 113 (*) 70 - 99 mg/dL    BUN 6  6 - 23 mg/dL    Creatinine, Ser 3.24  0.50 - 1.10 mg/dL  Calcium 9.3  8.4 - 10.5 mg/dL    Total Protein 7.2  6.0 - 8.3 g/dL    Albumin 3.9  3.5 - 5.2 g/dL    AST 23  0 - 37 U/L    ALT 12  0 - 35 U/L    Alkaline Phosphatase 67  39 - 117 U/L    Total Bilirubin 1.5 (*) 0.3 - 1.2 mg/dL    GFR calc non Af Amer >90  >90 mL/min    GFR calc Af Amer >90  >90 mL/min   CBC     Status: Abnormal   Collection Time   11/10/11  4:56 AM      Component Value Range Comment   WBC 11.4 (*) 4.0 - 10.5 K/uL    RBC 3.34 (*) 3.87 - 5.11 MIL/uL    Hemoglobin 10.5 (*) 12.0 - 15.0 g/dL    HCT 16.1 (*) 09.6 - 46.0 %    MCV 90.1  78.0 - 100.0 fL    MCH 31.4  26.0 - 34.0 pg    MCHC 34.9  30.0 - 36.0 g/dL    RDW 04.5  40.9 - 81.1 %    Platelets 477 (*) 150 - 400 K/uL   CK     Status: Normal   Collection Time   11/10/11  4:56 AM      Component Value Range Comment   Total CK 61  7 - 177 U/L   C-REACTIVE PROTEIN     Status:  Abnormal   Collection Time   11/10/11  4:56 AM      Component Value Range Comment   CRP 1.18 (*) <0.60 mg/dL   CBC     Status: Abnormal   Collection Time   11/11/11  6:25 AM      Component Value Range Comment   WBC 17.5 (*) 4.0 - 10.5 K/uL    RBC 3.35 (*) 3.87 - 5.11 MIL/uL    Hemoglobin 10.5 (*) 12.0 - 15.0 g/dL    HCT 91.4 (*) 78.2 - 46.0 %    MCV 90.7  78.0 - 100.0 fL    MCH 31.3  26.0 - 34.0 pg    MCHC 34.5  30.0 - 36.0 g/dL    RDW 95.6  21.3 - 08.6 %    Platelets 481 (*) 150 - 400 K/uL   COMPREHENSIVE METABOLIC PANEL     Status: Abnormal   Collection Time   11/11/11  6:25 AM      Component Value Range Comment   Sodium 139  135 - 145 mEq/L    Potassium 3.7  3.5 - 5.1 mEq/L    Chloride 101  96 - 112 mEq/L    CO2 27  19 - 32 mEq/L    Glucose, Bld 99  70 - 99 mg/dL    BUN 8  6 - 23 mg/dL    Creatinine, Ser 5.78  0.50 - 1.10 mg/dL    Calcium 9.4  8.4 - 46.9 mg/dL    Total Protein 7.3  6.0 - 8.3 g/dL    Albumin 4.0  3.5 - 5.2 g/dL    AST 20  0 - 37 U/L    ALT 11  0 - 35 U/L    Alkaline Phosphatase 61  39 - 117 U/L    Total Bilirubin 2.3 (*) 0.3 - 1.2 mg/dL    GFR calc non Af Amer >90  >90 mL/min    GFR calc Af Amer >90  >90 mL/min  Studies/Results: No results found.  Patient Active Problem List  Diagnosis  . SICKLE CELL ANEMIA  . TRICHOTILLOMANIA  . Active smoker  . Back pain  . Depression  . GERD (gastroesophageal reflux disease)  . Contraception management  . Stress  . Overweight  . Vaso-occlusive sickle cell crisis  . Right thigh pain    Impression: Resolving sickle cell crisis. Patient continues with mild hemolysis. Right thigh pain secondary to microinfarct. History of tobacco abuse. GERD   Plan: Anticipate discharge patient tomorrow. Routine outpatient followup in 2 weeks.   August Saucer, Kaylob Wallen 11/11/2011 9:03 PM

## 2011-11-11 NOTE — Progress Notes (Signed)
Utilization review completed. Raelene Trew, RN, BSN. 

## 2011-11-12 NOTE — Progress Notes (Signed)
Subjective:  Patient complains of back and right thigh pain grade 6-7/10. States still not ready to go home today denies any urinary complaints denies cough fever or chills  Objective:  Vital Signs in the last 24 hours: Temp:  [98.4 F (36.9 C)-98.7 F (37.1 C)] 98.4 F (36.9 C) (06/29 0548) Pulse Rate:  [87-113] 87  (06/29 0548) Resp:  [16-18] 16  (06/29 0548) BP: (101-112)/(50-75) 101/50 mmHg (06/29 0548) SpO2:  [94 %-98 %] 94 % (06/29 0548)  Intake/Output from previous day: 06/28 0701 - 06/29 0700 In: 1620 [P.O.:720; I.V.:900] Out: -  Intake/Output from this shift:    Physical Exam: Neck: no adenopathy, no carotid bruit, no JVD and supple, symmetrical, trachea midline Lungs: clear to auscultation bilaterally Heart: regular rate and rhythm, S1, S2 normal, no murmur, click, rub or gallop Abdomen: soft, non-tender; bowel sounds normal; no masses,  no organomegaly Extremities: extremities normal, atraumatic, no cyanosis or edema  Lab Results:  Basename 11/11/11 0625 11/10/11 0456  WBC 17.5* 11.4*  HGB 10.5* 10.5*  PLT 481* 477*    Basename 11/11/11 0625 11/10/11 0456  NA 139 139  K 3.7 3.9  CL 101 102  CO2 27 24  GLUCOSE 99 113*  BUN 8 6  CREATININE 0.82 0.58   No results found for this basename: TROPONINI:2,CK,MB:2 in the last 72 hours Hepatic Function Panel  Basename 11/11/11 0625  PROT 7.3  ALBUMIN 4.0  AST 20  ALT 11  ALKPHOS 61  BILITOT 2.3*  BILIDIR --  IBILI --   No results found for this basename: CHOL in the last 72 hours No results found for this basename: PROTIME in the last 72 hours  Imaging: Imaging results have been reviewed and No results found.  Cardiac Studies:  Assessment/Plan:  Resolving sickle cell crisis Marked leukocytosis GERD Depression Tobacco abuse Plan Continue present management Hopefully discharge home tomorrow  LOS: 4 days    [name] N 11/12/2011, 1:11 PM

## 2011-11-13 NOTE — Progress Notes (Signed)
Subjective:  Complaints of hip and leg pain grade 7/10 states not ready to go home yet  Objective:  Vital Signs in the last 24 hours: Temp:  [98.9 F (37.2 C)-99.3 F (37.4 C)] 99.3 F (37.4 C) (06/30 0705) Pulse Rate:  [91-98] 98  (06/30 0705) Resp:  [16-18] 16  (06/30 0705) BP: (101-106)/(52-59) 101/59 mmHg (06/30 0705) SpO2:  [99 %] 99 % (06/30 0705) Weight:  [83 kg (182 lb 15.7 oz)] 83 kg (182 lb 15.7 oz) (06/29 1700)  Intake/Output from previous day: 06/29 0701 - 06/30 0700 In: 800 [P.O.:320; I.V.:480] Out: -  Intake/Output from this shift: Total I/O In: 240 [P.O.:240] Out: -   Physical Exam: Neck: no adenopathy, no carotid bruit, no JVD and supple, symmetrical, trachea midline Lungs: clear to auscultation bilaterally Heart: regular rate and rhythm, S1, S2 normal, no murmur, click, rub or gallop Abdomen: soft, non-tender; bowel sounds normal; no masses,  no organomegaly Extremities: extremities normal, atraumatic, no cyanosis or edema  Lab Results:  Basename 11/11/11 0625  WBC 17.5*  HGB 10.5*  PLT 481*    Basename 11/11/11 0625  NA 139  K 3.7  CL 101  CO2 27  GLUCOSE 99  BUN 8  CREATININE 0.82   No results found for this basename: TROPONINI:2,CK,MB:2 in the last 72 hours Hepatic Function Panel  Basename 11/11/11 0625  PROT 7.3  ALBUMIN 4.0  AST 20  ALT 11  ALKPHOS 61  BILITOT 2.3*  BILIDIR --  IBILI --   No results found for this basename: CHOL in the last 72 hours No results found for this basename: PROTIME in the last 72 hours  Imaging: Imaging results have been reviewed and No results found.  Cardiac Studies:  Assessment/Plan:  Resolving sickle cell crisis  Marked leukocytosis  GERD  Depression  Tobacco abuse  Plan  Continue present management Check labs in a.m.  LOS: 5 days    [name] N 11/13/2011, 12:53 PM

## 2011-11-14 ENCOUNTER — Encounter (HOSPITAL_COMMUNITY): Payer: Self-pay | Admitting: Student

## 2011-11-14 LAB — CBC
HCT: 27.8 % — ABNORMAL LOW (ref 36.0–46.0)
Hemoglobin: 9.8 g/dL — ABNORMAL LOW (ref 12.0–15.0)
MCV: 91.7 fL (ref 78.0–100.0)
WBC: 15.9 10*3/uL — ABNORMAL HIGH (ref 4.0–10.5)

## 2011-11-14 LAB — BASIC METABOLIC PANEL
CO2: 27 mEq/L (ref 19–32)
Chloride: 101 mEq/L (ref 96–112)
Creatinine, Ser: 0.73 mg/dL (ref 0.50–1.10)
GFR calc Af Amer: >90 mL/min (ref >90)
Potassium: 3.8 mEq/L (ref 3.5–5.1)

## 2011-11-14 MED ORDER — OXYCODONE HCL 10 MG PO TABS: 10.0000 mg | ORAL_TABLET | Freq: Four times a day (QID) | ORAL | Status: DC | PRN

## 2011-11-14 NOTE — Progress Notes (Signed)
Met with patient today- she states that she will be d/c home today. She is currently receiving services with the [facility] and states that she has support from her boyfriend including transportation. Patient denied any CSW needs.  CSW signing off. Lorri Frederick. [place]  [phone]

## 2011-11-19 ENCOUNTER — Encounter (HOSPITAL_COMMUNITY): Payer: Self-pay | Admitting: *Deleted

## 2011-11-19 ENCOUNTER — Inpatient Hospital Stay (HOSPITAL_COMMUNITY)
Admission: EM | Admit: 2011-11-19 | Discharge: 2011-11-28 | DRG: 812 | Disposition: A | Discharge status: Home / Self Care | Payer: Medicaid Other | Attending: Internal Medicine | Admitting: Internal Medicine

## 2011-11-19 DIAGNOSIS — D72829 Elevated white blood cell count, unspecified: Secondary | ICD-10-CM | POA: Diagnosis present

## 2011-11-19 DIAGNOSIS — F17200 Nicotine dependence, unspecified, uncomplicated: Secondary | ICD-10-CM | POA: Diagnosis present

## 2011-11-19 DIAGNOSIS — F329 Major depressive disorder, single episode, unspecified: Secondary | ICD-10-CM | POA: Diagnosis present

## 2011-11-19 DIAGNOSIS — F3289 Other specified depressive episodes: Secondary | ICD-10-CM | POA: Diagnosis present

## 2011-11-19 DIAGNOSIS — F6389 Other impulse disorders: Secondary | ICD-10-CM | POA: Diagnosis present

## 2011-11-19 DIAGNOSIS — E669 Obesity, unspecified: Secondary | ICD-10-CM | POA: Diagnosis present

## 2011-11-19 DIAGNOSIS — R000 Tachycardia, unspecified: Secondary | ICD-10-CM

## 2011-11-19 DIAGNOSIS — M549 Dorsalgia, unspecified: Secondary | ICD-10-CM | POA: Diagnosis present

## 2011-11-19 DIAGNOSIS — N760 Acute vaginitis: Secondary | ICD-10-CM

## 2011-11-19 DIAGNOSIS — F439 Reaction to severe stress, unspecified: Secondary | ICD-10-CM

## 2011-11-19 DIAGNOSIS — K219 Gastro-esophageal reflux disease without esophagitis: Secondary | ICD-10-CM | POA: Diagnosis present

## 2011-11-19 DIAGNOSIS — D5700 Hb-SS disease with crisis, unspecified: Secondary | ICD-10-CM

## 2011-11-19 LAB — URINALYSIS, ROUTINE W REFLEX MICROSCOPIC
Bilirubin Urine: NEGATIVE
Glucose, UA: NEGATIVE mg/dL
Hgb urine dipstick: NEGATIVE
Ketones, ur: NEGATIVE mg/dL
Protein, ur: NEGATIVE mg/dL
Urobilinogen, UA: 1 mg/dL (ref 0.0–1.0)

## 2011-11-19 LAB — CBC
Hemoglobin: 9.5 g/dL — ABNORMAL LOW (ref 12.0–15.0)
MCH: 32.3 pg (ref 26.0–34.0)
MCHC: 35.2 g/dL (ref 30.0–36.0)
Platelets: 454 10*3/uL — ABNORMAL HIGH (ref 150–400)

## 2011-11-19 LAB — BASIC METABOLIC PANEL
BUN: 10 mg/dL (ref 6–23)
CO2: 24 mEq/L (ref 19–32)
Calcium: 9 mg/dL (ref 8.4–10.5)
Creatinine, Ser: 0.6 mg/dL (ref 0.50–1.10)
GFR calc non Af Amer: >90 mL/min (ref >90)
Glucose, Bld: 101 mg/dL — ABNORMAL HIGH (ref 70–99)

## 2011-11-19 LAB — RETICULOCYTES
RBC.: 2.94 MIL/uL — ABNORMAL LOW (ref 3.87–5.11)
Retic Ct Pct: 12.4 % — ABNORMAL HIGH (ref 0.4–3.1)

## 2011-11-19 MED ORDER — DOCUSATE SODIUM 100 MG PO CAPS
100.0000 mg | ORAL_CAPSULE | Freq: Two times a day (BID) | ORAL | Status: DC
  Administered 2011-11-19 – 2011-11-21 (×5): 100 mg via ORAL
  Filled 2011-11-19 (×6): qty 1

## 2011-11-19 MED ORDER — FAMOTIDINE 20 MG PO TABS
20.0000 mg | ORAL_TABLET | Freq: Two times a day (BID) | ORAL | Status: DC
  Administered 2011-11-20 – 2011-11-21 (×3): 20 mg via ORAL
  Filled 2011-11-19 (×5): qty 1

## 2011-11-19 MED ORDER — SODIUM CHLORIDE 0.9 % IV SOLN
INTRAVENOUS | Status: DC
  Administered 2011-11-19 (×3): via INTRAVENOUS
  Administered 2011-11-20: 1000 mL via INTRAVENOUS
  Administered 2011-11-20: 125 mL via INTRAVENOUS

## 2011-11-19 MED ORDER — ALPRAZOLAM 0.25 MG PO TABS
0.2500 mg | ORAL_TABLET | ORAL | Status: DC | PRN
  Administered 2011-11-23: 0.25 mg via ORAL
  Administered 2011-11-24: 0.5 mg via ORAL
  Administered 2011-11-25 (×2): 0.25 mg via ORAL
  Filled 2011-11-19: qty 2
  Filled 2011-11-19 (×3): qty 1

## 2011-11-19 MED ORDER — HYDROMORPHONE HCL PF 2 MG/ML IJ SOLN
2.0000 mg | Freq: Once | INTRAMUSCULAR | Status: AC
  Administered 2011-11-19: 2 mg via INTRAVENOUS
  Filled 2011-11-19: qty 1

## 2011-11-19 MED ORDER — FAMOTIDINE IN NACL 20-0.9 MG/50ML-% IV SOLN
20.0000 mg | Freq: Two times a day (BID) | INTRAVENOUS | Status: DC
  Administered 2011-11-19: 20 mg via INTRAVENOUS
  Filled 2011-11-19 (×4): qty 50

## 2011-11-19 MED ORDER — FOLIC ACID 1 MG PO TABS
1.0000 mg | ORAL_TABLET | Freq: Every day | ORAL | Status: DC
  Administered 2011-11-19 – 2011-11-28 (×10): 1 mg via ORAL
  Filled 2011-11-19 (×10): qty 1

## 2011-11-19 MED ORDER — DIPHENHYDRAMINE HCL 25 MG PO CAPS
25.0000 mg | ORAL_CAPSULE | Freq: Once | ORAL | Status: AC
  Administered 2011-11-19: 25 mg via ORAL
  Filled 2011-11-19: qty 1

## 2011-11-19 MED ORDER — ONDANSETRON HCL 4 MG PO TABS: 4.0000 mg | ORAL_TABLET | ORAL | Status: DC | PRN

## 2011-11-19 MED ORDER — FAMOTIDINE 10 MG/ML IV SOLN
20.0000 mg | Freq: Two times a day (BID) | INTRAVENOUS | Status: DC
  Filled 2011-11-19 (×3): qty 2

## 2011-11-19 MED ORDER — DIPHENHYDRAMINE HCL 50 MG/ML IJ SOLN
12.5000 mg | INTRAMUSCULAR | Status: DC | PRN
  Administered 2011-11-19 – 2011-11-24 (×2): 25 mg via INTRAVENOUS
  Filled 2011-11-19 (×2): qty 1

## 2011-11-19 MED ORDER — HYDROMORPHONE HCL PF 2 MG/ML IJ SOLN
2.0000 mg | Freq: Once | INTRAMUSCULAR | Status: AC
  Administered 2011-11-19: 2 mg via INTRAVENOUS
  Filled 2011-11-19 (×2): qty 1

## 2011-11-19 MED ORDER — ONDANSETRON HCL 4 MG/2ML IJ SOLN
4.0000 mg | Freq: Once | INTRAMUSCULAR | Status: AC
  Administered 2011-11-19: 4 mg via INTRAVENOUS
  Filled 2011-11-19 (×2): qty 2

## 2011-11-19 MED ORDER — HYDROXYUREA 500 MG PO CAPS
500.0000 mg | ORAL_CAPSULE | Freq: Two times a day (BID) | ORAL | Status: DC
  Administered 2011-11-19 – 2011-11-28 (×19): 500 mg via ORAL
  Filled 2011-11-19 (×20): qty 1

## 2011-11-19 MED ORDER — ZOLPIDEM TARTRATE 5 MG PO TABS
5.0000 mg | ORAL_TABLET | Freq: Every evening | ORAL | Status: DC | PRN
  Administered 2011-11-25: 5 mg via ORAL
  Filled 2011-11-19: qty 1

## 2011-11-19 MED ORDER — DIPHENHYDRAMINE HCL 25 MG PO CAPS
25.0000 mg | ORAL_CAPSULE | ORAL | Status: DC | PRN
  Administered 2011-11-28: 25 mg via ORAL
  Filled 2011-11-19: qty 1

## 2011-11-19 MED ORDER — FAMOTIDINE 20 MG PO TABS
20.0000 mg | ORAL_TABLET | Freq: Two times a day (BID) | ORAL | Status: DC
  Administered 2011-11-19: 20 mg via ORAL
  Filled 2011-11-19: qty 1

## 2011-11-19 MED ORDER — HYDROMORPHONE HCL PF 2 MG/ML IJ SOLN
2.0000 mg | INTRAMUSCULAR | Status: DC | PRN
  Administered 2011-11-19 – 2011-11-20 (×8): 2 mg via INTRAVENOUS
  Filled 2011-11-19 (×9): qty 1

## 2011-11-19 MED ORDER — ONDANSETRON HCL 4 MG/2ML IJ SOLN
4.0000 mg | INTRAMUSCULAR | Status: DC | PRN
  Administered 2011-11-21 – 2011-11-24 (×2): 4 mg via INTRAVENOUS
  Filled 2011-11-19 (×2): qty 2

## 2011-11-19 MED ORDER — HYDROMORPHONE HCL PF 2 MG/ML IJ SOLN: 2.0000 mg | INTRAMUSCULAR | Status: DC | PRN

## 2011-11-19 NOTE — ED Provider Notes (Signed)
Medical screening examination/treatment/procedure(s) were performed by non-physician practitioner and as supervising physician I was immediately available for consultation/collaboration.   Yoel Kaufhold, MD 11/19/11 0745 

## 2011-11-19 NOTE — ED Provider Notes (Addendum)
History     CSN: [id number]  Arrival date & time 11/19/11  0217   First MD Initiated Contact with Patient 11/19/11 [phone]      Chief Complaint  Patient presents with  . Sickle Cell Pain Crisis    (Consider location/radiation/quality/duration/timing/severity/associated sxs/prior treatment) HPI Comments: Patient with recurrent R ground pain which is her normal pain site Stats it really did not get better since the last Sickle cell crisis  Patient is a 21 y.o. female presenting with sickle cell pain. The history is provided by the patient.  Sickle Cell Pain Crisis  This is a recurrent problem. The current episode started today. The problem occurs continuously. The problem has been gradually worsening. Site of pain is localized in muscle. The pain is severe. Pertinent negatives include no blurred vision, no headaches, no sore throat, no swollen glands, no back pain and no weakness.    Past Medical History  Diagnosis Date  . H/O: 1 miscarriage 03/22/2011  . 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 Depression 01/06/2011  . GERD (gastroesophageal reflux disease) 02/17/2011  . Trichotillomania     h/o  . Blood transfusion     "lots"  . Sickle cell anemia with crisis   . Exertional dyspnea     "sometimes"  . Sickle cell anemia   . Headache   . Migraines 11/08/11    "@ least twice/month"  . Chronic back pain     "very severe; have knot in my back; from tight muscle; take RX and exercise for it"  . Mood swings 11/08/11    "I go back and forth; real bad"    Past Surgical History  Procedure Date  . Cholecystectomy 05/2010  . Dilation and curettage of uterus 02/20/11    S/P miscarriage    History reviewed. No pertinent family history.  History  Substance Use Topics  . Smoking status: Never Smoker   . Smokeless tobacco: Never Used  . Alcohol Use: No    OB History    Grav Para Term Preterm Abortions TAB SAB Ect Mult Living   1    1           Review of Systems  Constitutional: Negative  for fever.  HENT: Negative for sore throat.   Eyes: Negative for blurred vision.  Musculoskeletal: Positive for arthralgias. Negative for back pain, joint swelling and gait problem.  Neurological: Negative for weakness and headaches.    Allergies  Ketorolac tromethamine and Toradol  Home Medications   Current Outpatient Rx  Name Route Sig Dispense Refill  . VITAMIN D 1000 UNITS PO TABS Oral Take 1,000 Units by mouth daily.    . CYCLOBENZAPRINE HCL 5 MG PO TABS Oral Take 5 mg by mouth 2 (two) times daily as needed. Patient used this medication for her muscle spasms.     Marland Kitchen HYDROXYUREA 500 MG PO CAPS Oral Take 1,000 mg by mouth daily. May take with food to minimize GI side effects.    . IBUPROFEN 200 MG PO TABS Oral Take 600 mg by mouth daily. Patient used this medication for her pain.    . OXYCODONE HCL 10 MG PO TABS Oral Take 1 tablet (10 mg total) by mouth 4 (four) times daily as needed. For pain. 60 tablet 0  . PANTOPRAZOLE SODIUM 40 MG PO TBEC Oral Take 40 mg by mouth daily.      BP 101/60  Pulse 96  Temp 98.6 F (37 C) (Oral)  Resp 15  Ht 5\' 3"  (1.6  m)  Wt 182 lb (82.555 kg)  BMI 32.24 kg/m2  SpO2 100%  LMP 11/04/2011  Physical Exam  Constitutional: She appears well-developed and well-nourished.  Eyes: Pupils are equal, round, and reactive to light.  Neck: Normal range of motion.  Cardiovascular: Normal rate.   Pulmonary/Chest: Effort normal.  Abdominal: Soft. Bowel sounds are normal.  Musculoskeletal: She exhibits tenderness. She exhibits no edema.       Right hip: She exhibits tenderness. She exhibits normal range of motion, normal strength, no bony tenderness, no swelling, no crepitus, no deformity and no laceration.       Legs:   ED Course  Procedures (including critical care time)   Labs Reviewed  CBC  RETICULOCYTES  URINALYSIS, ROUTINE W REFLEX MICROSCOPIC   No results found.   No diagnosis found.    MDM  SCC        Arman Filter,  NP 11/19/11 0325  Arman Filter, NP 11/19/11 [phone]

## 2011-11-19 NOTE — ED Notes (Signed)
Attempts to start IV failed, IV team paged. Pt. Is asleep most of the time but easily  Wakes up to calling. Asymptomatic, kept monitored.

## 2011-11-19 NOTE — H&P (Signed)
[name] is an 21 y.o. female.   Chief Complaint: Right thigh pain.  HPI: 21 years old female with right thigh pain came to ER. She has sickle cell crisis with similar pain. She has leukocytosis and elevated retic count. No chest pain, shortness of breath or abdominal pain.   Past Medical History  Diagnosis Date  . H/O: 1 miscarriage 03/22/2011  . 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 Depression 01/06/2011  . GERD (gastroesophageal reflux disease) 02/17/2011  . Trichotillomania     h/o  . Blood transfusion     "lots"  . Sickle cell anemia with crisis   . Exertional dyspnea     "sometimes"  . Sickle cell anemia   . Headache   . Migraines 11/08/11    "@ least twice/month"  . Chronic back pain     "very severe; have knot in my back; from tight muscle; take RX and exercise for it"  . Mood swings 11/08/11    "I go back and forth; real bad"      Past Surgical History  Procedure Date  . Cholecystectomy 05/2010  . Dilation and curettage of uterus 02/20/11    S/P miscarriage    History reviewed. No pertinent family history. Social History:  reports that she has never smoked. She has never used smokeless tobacco. She reports that she uses illicit drugs (Marijuana). She reports that she does not drink alcohol.  Allergies:  Allergies  Allergen Reactions  . Ketorolac Tromethamine Hives and Itching  . Toradol (Ketorolac Tromethamine) Hives and Itching     (Not in a hospital admission)  Results for orders placed during the hospital encounter of 11/19/11 (from the past 48 hour(s))  CBC     Status: Abnormal   Collection Time   11/19/11  4:07 AM      Component Value Range Comment   WBC 16.0 (*) 4.0 - 10.5 K/uL    RBC 2.94 (*) 3.87 - 5.11 MIL/uL    Hemoglobin 9.5 (*) 12.0 - 15.0 g/dL    HCT 40.9 (*) 81.1 - 46.0 %    MCV 91.8  78.0 - 100.0 fL    MCH 32.3  26.0 - 34.0 pg    MCHC 35.2  30.0 - 36.0 g/dL    RDW 91.4 (*) 78.2 - 15.5 %    Platelets 454 (*) 150 - 400 K/uL   RETICULOCYTES      Status: Abnormal   Collection Time   11/19/11  4:07 AM      Component Value Range Comment   Retic Ct Pct 12.4 (*) 0.4 - 3.1 %    RBC. 2.94 (*) 3.87 - 5.11 MIL/uL    Retic Count, Manual 364.6 (*) 19.0 - 186.0 K/uL   BASIC METABOLIC PANEL     Status: Abnormal   Collection Time   11/19/11  4:07 AM      Component Value Range Comment   Sodium 134 (*) 135 - 145 mEq/L    Potassium 3.6  3.5 - 5.1 mEq/L    Chloride 102  96 - 112 mEq/L    CO2 24  19 - 32 mEq/L    Glucose, Bld 101 (*) 70 - 99 mg/dL    BUN 10  6 - 23 mg/dL    Creatinine, Ser 9.56  0.50 - 1.10 mg/dL    Calcium 9.0  8.4 - 21.3 mg/dL    GFR calc non Af Amer >90  >90 mL/min    GFR calc Af Amer >90  >  90 mL/min   URINALYSIS, ROUTINE W REFLEX MICROSCOPIC     Status: Normal   Collection Time   11/19/11  4:47 AM      Component Value Range Comment   Color, Urine YELLOW  YELLOW    APPearance CLEAR  CLEAR    Specific Gravity, Urine 1.014  1.005 - 1.030    pH 6.5  5.0 - 8.0    Glucose, UA NEGATIVE  NEGATIVE mg/dL    Hgb urine dipstick NEGATIVE  NEGATIVE    Bilirubin Urine NEGATIVE  NEGATIVE    Ketones, ur NEGATIVE  NEGATIVE mg/dL    Protein, ur NEGATIVE  NEGATIVE mg/dL    Urobilinogen, UA 1.0  0.0 - 1.0 mg/dL    Nitrite NEGATIVE  NEGATIVE    Leukocytes, UA NEGATIVE  NEGATIVE MICROSCOPIC NOT DONE ON URINES WITH NEGATIVE PROTEIN, BLOOD, LEUKOCYTES, NITRITE, OR GLUCOSE <1000 mg/dL.   No results found.  @ROS @ Constitutional: Negative for fever. + weight gain HENT: Negative for sore throat.  Eyes: Negative for blurred vision.  Musculoskeletal: Positive for arthralgias. Negative for back pain, joint swelling and gait problem.  Neurological: Negative for weakness and headaches.  Skin: + Itching Blood pressure 91/69, pulse 96, temperature 98.3 F (36.8 C), temperature source Oral, resp. rate 16, height 5\' 3"  (1.6 m), weight 82.555 kg (182 lb), last menstrual period 11/04/2011, SpO2 100.00%. Physical Exam  Constitutional: She appears  well-developed and well-nourished.  Eyes: Brown eyes, Conj-pale pink, Pupils are equal, round, and reactive to light.  Neck: Normal range of motion.  Cardiovascular: Normal S1 and S2. Tachycardic.  Pulmonary/Chest: Effort normal. Clear to auscultation. Abdominal: Soft. Bowel sounds are normal.  Musculoskeletal: Right thigh tenderness. No edema.  Right hip: +ve tenderness with normal range of motion and strength. No swelling, no crepitus, no deformity and no laceration.    Assessment/Plan Sickle cell crisis and anemia Leukocytosis Right thigh pain Obesity  Admit for pain management  [name] 11/19/2011, 9:29 AM

## 2011-11-19 NOTE — ED Provider Notes (Signed)
6:33 AM Assumed care of patient from Sharen Hones, NP.  Patient with a history of Sickle Cell Disease comes in today in Sickle Cell Crisis.  Patient does not want to be admitted to the hospital.  The plan is to give the patient more pain medications and discharge home once pain improves.  Reassessed patient.  She reports that she is still having lots of pain of her right upper thigh.  Patient was just given more pain medication.  Will reassess shortly.  7:25 AM Reassessed patient.  Patient reports that her pain has improved somewhat, but that she is still having significant pain.  Will order more pain medication and reassess.  8:28 AM Reassessed patient.  She reports that her pain has not improved.  Will call and get patient admitted.  Discussed with Dr. Algie Coffer.  He reports that he will admit the patient.  Pascal Lux [place], PA-C 11/19/11 [phone]

## 2011-11-19 NOTE — ED Provider Notes (Signed)
Medical screening examination/treatment/procedure(s) were performed by non-physician practitioner and as supervising physician I was immediately available for consultation/collaboration.   Sedalia Greeson, MD 11/19/11 0437 

## 2011-11-19 NOTE — ED Notes (Signed)
PT c/o groin and R leg pain consistent w/ sickle cell pain.

## 2011-11-20 MED ORDER — HYDROMORPHONE HCL PF 2 MG/ML IJ SOLN
2.0000 mg | INTRAMUSCULAR | Status: DC | PRN
  Administered 2011-11-20 – 2011-11-21 (×6): 2 mg via INTRAVENOUS
  Filled 2011-11-20 (×7): qty 1

## 2011-11-20 NOTE — ED Provider Notes (Signed)
Medical screening examination/treatment/procedure(s) were performed by non-physician practitioner and as supervising physician I was immediately available for consultation/collaboration.  Sunnie Nielsen, MD 11/20/11 [phone]

## 2011-11-20 NOTE — Progress Notes (Signed)
Subjective:  Feeling better. Afebrile. Sleepy per nurse.  Objective:  Vital Signs in the last 24 hours: Temp:  [97.5 F (36.4 C)-98.6 F (37 C)] 98 F (36.7 C) (07/07 0600) Pulse Rate:  [97-102] 97  (07/07 0600) Cardiac Rhythm:  [-]  Resp:  [18] 18  (07/07 0600) BP: (107-114)/(70-75) 114/72 mmHg (07/07 0600) SpO2:  [96 %-99 %] 98 % (07/07 0600)  Physical Exam: BP Readings from Last 1 Encounters:  11/20/11 114/72    Wt Readings from Last 1 Encounters:  11/19/11 82.555 kg (182 lb) (94.85%*)   * Growth percentiles are based on CDC 2-20 Years data.    Weight change: 0 kg (0 lb)  HEENT: [place]/AT, Eyes-Brown, PERL, EOMI, Conjunctiva-Pale pink, Sclera-Non-icteric Neck: No JVD, No bruit, Trachea midline. Lungs:  Clear, Bilateral. Cardiac:  Regular rhythm, normal S1 and S2, no S3.  Abdomen:  Soft, non-tender. Extremities:  No edema present. No cyanosis. No clubbing. CNS: AxOx3, Cranial nerves grossly intact, moves all 4 extremities. Right handed. Skin: Warm and dry.   Intake/Output from previous day: 07/06 0701 - 07/07 0700 In: 120 [P.O.:120] Out: -     Lab Results: BMET    Component Value Date/Time   NA 134* 11/19/2011 0407   K 3.6 11/19/2011 0407   CL 102 11/19/2011 0407   CO2 24 11/19/2011 0407   GLUCOSE 101* 11/19/2011 0407   BUN 10 11/19/2011 0407   CREATININE 0.60 11/19/2011 0407   CREATININE 0.59 06/13/2011 1142   CALCIUM 9.0 11/19/2011 0407   GFRNONAA >90 11/19/2011 0407   GFRAA >90 11/19/2011 0407   CBC    Component Value Date/Time   WBC 16.0* 11/19/2011 0407   RBC 2.94* 11/19/2011 0407   HGB 9.5* 11/19/2011 0407   HCT 27.0* 11/19/2011 0407   PLT 454* 11/19/2011 0407   MCV 91.8 11/19/2011 0407   MCH 32.3 11/19/2011 0407   MCHC 35.2 11/19/2011 0407   RDW 15.6* 11/19/2011 0407   LYMPHSABS 2.9 11/04/2011 1606   MONOABS 1.4* 11/04/2011 1606   EOSABS 0.3 11/04/2011 1606   BASOSABS 0.0 11/04/2011 1606   CARDIAC ENZYMES Lab Results  Component Value Date   CKTOTAL 61 11/10/2011   CKMB 1.9  01/15/2011   TROPONINI <0.30 01/15/2011    Assessment/Plan:  Patient Active Hospital Problem List: Sickle cell crisis and anemia  Leukocytosis  Right thigh pain  Obesity    LOS: 1 day    Orpah Cobb  MD  11/20/2011, 12:14 PM

## 2011-11-21 ENCOUNTER — Inpatient Hospital Stay (HOSPITAL_COMMUNITY): Payer: Medicaid Other

## 2011-11-21 ENCOUNTER — Encounter (HOSPITAL_COMMUNITY): Payer: Self-pay | Admitting: *Deleted

## 2011-11-21 LAB — CBC
HCT: 28.9 % — ABNORMAL LOW (ref 36.0–46.0)
Hemoglobin: 10.1 g/dL — ABNORMAL LOW (ref 12.0–15.0)
MCHC: 34.9 g/dL (ref 30.0–36.0)
RDW: 16.2 % — ABNORMAL HIGH (ref 11.5–15.5)
WBC: 16.2 10*3/uL — ABNORMAL HIGH (ref 4.0–10.5)

## 2011-11-21 LAB — BASIC METABOLIC PANEL
BUN: 8 mg/dL (ref 6–23)
Chloride: 101 mEq/L (ref 96–112)
GFR calc Af Amer: >90 mL/min (ref >90)
GFR calc non Af Amer: >90 mL/min (ref >90)
Glucose, Bld: 97 mg/dL (ref 70–99)
Potassium: 3.9 mEq/L (ref 3.5–5.1)
Sodium: 136 mEq/L (ref 135–145)

## 2011-11-21 LAB — BILIRUBIN, TOTAL: Total Bilirubin: 1.8 mg/dL — ABNORMAL HIGH (ref 0.3–1.2)

## 2011-11-21 LAB — PHOSPHORUS: Phosphorus: 4.2 mg/dL (ref 2.3–4.6)

## 2011-11-21 LAB — PRO B NATRIURETIC PEPTIDE: Pro B Natriuretic peptide (BNP): 16.2 pg/mL (ref 0–125)

## 2011-11-21 MED ORDER — LEVOFLOXACIN 750 MG PO TABS
750.0000 mg | ORAL_TABLET | ORAL | Status: DC
  Administered 2011-11-21: 750 mg via ORAL
  Filled 2011-11-21 (×2): qty 1

## 2011-11-21 MED ORDER — LIDOCAINE 5 % EX PTCH
2.0000 | MEDICATED_PATCH | CUTANEOUS | Status: DC
  Administered 2011-11-21: 2 via TRANSDERMAL
  Administered 2011-11-22: 1 via TRANSDERMAL
  Administered 2011-11-23 – 2011-11-27 (×5): 2 via TRANSDERMAL
  Filled 2011-11-21 (×8): qty 2

## 2011-11-21 MED ORDER — CYCLOBENZAPRINE HCL 5 MG PO TABS
5.0000 mg | ORAL_TABLET | Freq: Two times a day (BID) | ORAL | Status: DC | PRN
  Administered 2011-11-27: 5 mg via ORAL
  Filled 2011-11-21 (×3): qty 1

## 2011-11-21 MED ORDER — OXYCODONE HCL 10 MG PO TABS: 10.0000 mg | ORAL_TABLET | Freq: Four times a day (QID) | ORAL | Status: DC | PRN

## 2011-11-21 MED ORDER — SENNOSIDES-DOCUSATE SODIUM 8.6-50 MG PO TABS
2.0000 | ORAL_TABLET | Freq: Every day | ORAL | Status: DC
  Filled 2011-11-21: qty 2

## 2011-11-21 MED ORDER — HYDROXYZINE HCL 25 MG PO TABS
25.0000 mg | ORAL_TABLET | Freq: Three times a day (TID) | ORAL | Status: DC
  Administered 2011-11-21 – 2011-11-28 (×20): 25 mg via ORAL
  Filled 2011-11-21 (×24): qty 1

## 2011-11-21 MED ORDER — LEVALBUTEROL HCL 0.63 MG/3ML IN NEBU
0.6300 mg | INHALATION_SOLUTION | Freq: Two times a day (BID) | RESPIRATORY_TRACT | Status: DC
  Administered 2011-11-21 – 2011-11-25 (×4): 0.63 mg via RESPIRATORY_TRACT
  Filled 2011-11-21 (×13): qty 3

## 2011-11-21 MED ORDER — PANTOPRAZOLE SODIUM 40 MG PO TBEC
40.0000 mg | DELAYED_RELEASE_TABLET | Freq: Two times a day (BID) | ORAL | Status: DC
  Administered 2011-11-21 – 2011-11-28 (×15): 40 mg via ORAL
  Filled 2011-11-21 (×20): qty 1

## 2011-11-21 MED ORDER — RISAQUAD PO CAPS
2.0000 | ORAL_CAPSULE | Freq: Every day | ORAL | Status: DC
  Administered 2011-11-21 – 2011-11-28 (×8): 2 via ORAL
  Filled 2011-11-21 (×8): qty 2

## 2011-11-21 MED ORDER — POLYETHYLENE GLYCOL 3350 17 G PO PACK
17.0000 g | PACK | Freq: Every day | ORAL | Status: DC
  Administered 2011-11-21 – 2011-11-28 (×6): 17 g via ORAL
  Filled 2011-11-21 (×8): qty 1

## 2011-11-21 MED ORDER — FLUVOXAMINE MALEATE 50 MG PO TABS
50.0000 mg | ORAL_TABLET | Freq: Every day | ORAL | Status: DC
  Administered 2011-11-21 – 2011-11-27 (×7): 50 mg via ORAL
  Filled 2011-11-21 (×8): qty 1

## 2011-11-21 MED ORDER — HYDROMORPHONE HCL PF 1 MG/ML IJ SOLN
INTRAMUSCULAR | Status: AC
  Administered 2011-11-21: 2 mg
  Filled 2011-11-21: qty 2

## 2011-11-21 MED ORDER — VITAMIN D3 25 MCG (1000 UNIT) PO TABS
1000.0000 [IU] | ORAL_TABLET | Freq: Every day | ORAL | Status: DC
  Administered 2011-11-21 – 2011-11-28 (×8): 1000 [IU] via ORAL
  Filled 2011-11-21 (×8): qty 1

## 2011-11-21 MED ORDER — HYDROMORPHONE HCL PF 2 MG/ML IJ SOLN
2.0000 mg | INTRAMUSCULAR | Status: DC | PRN
  Administered 2011-11-21: 4 mg via INTRAVENOUS
  Administered 2011-11-21 (×2): 2 mg via INTRAVENOUS
  Administered 2011-11-21: 4 mg via INTRAVENOUS
  Administered 2011-11-22: 2 mg via INTRAVENOUS
  Administered 2011-11-22 – 2011-11-26 (×28): 4 mg via INTRAVENOUS
  Administered 2011-11-26: 2 mg via INTRAVENOUS
  Administered 2011-11-26 (×4): 4 mg via INTRAVENOUS
  Administered 2011-11-26: 2 mg via INTRAVENOUS
  Administered 2011-11-26 – 2011-11-27 (×10): 4 mg via INTRAVENOUS
  Administered 2011-11-28: 2 mg via INTRAVENOUS
  Administered 2011-11-28: 3 mg via INTRAVENOUS
  Filled 2011-11-21: qty 2
  Filled 2011-11-21: qty 1
  Filled 2011-11-21 (×12): qty 2
  Filled 2011-11-21: qty 1
  Filled 2011-11-21 (×12): qty 2
  Filled 2011-11-21 (×2): qty 1
  Filled 2011-11-21 (×7): qty 2
  Filled 2011-11-21: qty 1
  Filled 2011-11-21 (×3): qty 2
  Filled 2011-11-21 (×2): qty 1
  Filled 2011-11-21 (×11): qty 2

## 2011-11-21 MED ORDER — DEXTROSE-NACL 5-0.45 % IV SOLN
INTRAVENOUS | Status: DC
  Administered 2011-11-21 – 2011-11-22 (×3): via INTRAVENOUS

## 2011-11-21 MED ORDER — PANTOPRAZOLE SODIUM 40 MG PO TBEC: 40.0000 mg | DELAYED_RELEASE_TABLET | Freq: Every day | ORAL | Status: DC

## 2011-11-21 MED ORDER — ENOXAPARIN SODIUM 40 MG/0.4ML ~~LOC~~ SOLN
40.0000 mg | SUBCUTANEOUS | Status: DC
  Administered 2011-11-21 – 2011-11-26 (×3): 40 mg via SUBCUTANEOUS
  Filled 2011-11-21 (×8): qty 0.4

## 2011-11-21 MED ORDER — HYDROXYUREA 500 MG PO CAPS: 1000.0000 mg | ORAL_CAPSULE | Freq: Every day | ORAL | Status: DC

## 2011-11-21 MED ORDER — OXYCODONE HCL 5 MG PO TABS
10.0000 mg | ORAL_TABLET | Freq: Four times a day (QID) | ORAL | Status: DC | PRN
  Administered 2011-11-23 – 2011-11-28 (×4): 10 mg via ORAL
  Filled 2011-11-21: qty 1
  Filled 2011-11-21: qty 2
  Filled 2011-11-21: qty 1
  Filled 2011-11-21 (×3): qty 2

## 2011-11-21 NOTE — Progress Notes (Signed)
[facility] Service Progress Note    Subjective: The patient was seen on rounds today.  The patient was laying quietly in her bed and her boyfriend was asleep on the sofa.  The patient is complaining of pain 7/10 in her bilateral LEs (from her inguinal area to her ankles).  The patient asked for her pain and pruritis medications to be adjusted.  In addition, the patient stated that she has not been receiving her home medications.  The patient then went on to state that she would like to be discharged today or tomorrow maybe because it is her boyfriend's birthday.  Explained to the patient that I cannot guarantee D/C even tomorrow because she has elevated counts that are concerning.  The patient then stated that she is not trying to rush D/C as her health is more important.  Discussed in detail and agreed upon plan of care which includes CXR, numerous laboratories including blood cultures, antibiotic therapy, PT evaluation and treatment for bilateral LE pain and for the patient to use the IS often.   No other nursing or patient concerns at this time.   Objective: Vital signs in last 24 hours: Blood pressure 108/72, pulse 92, temperature 98.1 F (36.7 C), temperature source Oral, resp. rate 20, height 5\' 3"  (1.6 m), weight 182 lb (82.555 kg), last menstrual period 11/04/2011, SpO2 97.00%.  General Appearance: Alert, cooperative, well nourished, well developed, mild distress Head: Normocephalic, without obvious abnormality, atraumatic Eyes: PERRLA, EOM's intact, scleral icterus Nose: Nares, septum and mucosa are normal, no drainage or sinus tenderness Throat: Lips, mucosa, and tongue are normal,  teeth and gums normal Back: Symmetric, no curvature, normal ROM, no CVA tenderness Cardio: Regular rate and rhythm, S1, S2 normal, no murmur/click/rub/gallop, tender midsternal area with palpation GI: Soft, distended, non-tender, hypoactive bowel sounds, no organomegaly Extremities: Extremities  normal, atraumatic, no cyanosis, no edema, Homans sign is negative, no sign of DVT, tender bilateral LEs, limited ROM bilateral LEs Pulses: 2+ and symmetric Skin: Skin color, texture, turgor normal,  no rashes or lesions Neurologic: Grossly normal, AO x 3, no focal deficits, CN II - XII intact Psych:  Appropriate affect  Lab Results: Results for orders placed during the hospital encounter of 11/19/11 (from the past 24 hour(s))  CBC     Status: Abnormal   Collection Time   11/21/11  4:46 AM      Component Value Range   WBC 16.2 (*) 4.0 - 10.5 K/uL   RBC 3.14 (*) 3.87 - 5.11 MIL/uL   Hemoglobin 10.1 (*) 12.0 - 15.0 g/dL   HCT 16.1 (*) 09.6 - 04.5 %   MCV 92.0  78.0 - 100.0 fL   MCH 32.2  26.0 - 34.0 pg   MCHC 34.9  30.0 - 36.0 g/dL   RDW 40.9 (*) 81.1 - 91.4 %   Platelets 497 (*) 150 - 400 K/uL  BASIC METABOLIC PANEL     Status: Normal   Collection Time   11/21/11  4:46 AM      Component Value Range   Sodium 136  135 - 145 mEq/L   Potassium 3.9  3.5 - 5.1 mEq/L   Chloride 101  96 - 112 mEq/L   CO2 25  19 - 32 mEq/L   Glucose, Bld 97  70 - 99 mg/dL   BUN 8  6 - 23 mg/dL   Creatinine, Ser 7.82  0.50 - 1.10 mg/dL   Calcium 9.3  8.4 - 95.6 mg/dL  GFR calc non Af Amer >90  >90 mL/min   GFR calc Af Amer >90  >90 mL/min    Studies/Results: No results found.   Medications: Allergies  Allergen Reactions  . Ketorolac Tromethamine Hives and Itching  . Toradol (Ketorolac Tromethamine) Hives and Itching    Current Facility-Administered Medications  Medication Dose Route Frequency Provider Last Rate Last Dose  . ALPRAZolam Prudy Feeler) tablet 0.25-0.5 mg  0.25-0.5 mg Oral Q3H PRN Ricki Rodriguez, MD      . cholecalciferol (VITAMIN D) tablet 1,000 Units  1,000 Units Oral Daily Keitha Butte, NP      . cyclobenzaprine (FLEXERIL) tablet 5 mg  5 mg Oral BID PRN Keitha Butte, NP      . dextrose 5 %-0.45 % sodium chloride infusion   Intravenous Continuous Keitha Butte, NP 75  mL/hr at 11/21/11 1117    . diphenhydrAMINE (BENADRYL) capsule 25-50 mg  25-50 mg Oral Q4H PRN Ricki Rodriguez, MD       Or  . diphenhydrAMINE (BENADRYL) injection 12.5-25 mg  12.5-25 mg Intravenous Q4H PRN Ricki Rodriguez, MD   25 mg at 11/19/11 1139  . enoxaparin (LOVENOX) injection 40 mg  40 mg Subcutaneous Q24H Keitha Butte, NP      . fluvoxaMINE (LUVOX) tablet 50 mg  50 mg Oral QHS Keitha Butte, NP      . folic acid (FOLVITE) tablet 1 mg  1 mg Oral Daily Ricki Rodriguez, MD   1 mg at 11/21/11 1033  . HYDROmorphone (DILAUDID) injection 2-4 mg  2-4 mg Intravenous Q2H PRN Keitha Butte, NP   2 mg at 11/21/11 1115  . hydroxyurea (HYDREA) capsule 500 mg  500 mg Oral BID Ricki Rodriguez, MD   500 mg at 11/21/11 1033  . hydrOXYzine (ATARAX/VISTARIL) tablet 25 mg  25 mg Oral TID Keitha Butte, NP      . levalbuterol Pauline Aus) nebulizer solution 0.63 mg  0.63 mg Nebulization BID Keitha Butte, NP      . levofloxacin (LEVAQUIN) tablet 750 mg  750 mg Oral Daily Keitha Butte, NP      . lidocaine (LIDODERM) 5 % 2 patch  2 patch Transdermal Q24H Keitha Butte, NP      . ondansetron (ZOFRAN) tablet 4 mg  4 mg Oral Q4H PRN Ricki Rodriguez, MD       Or  . ondansetron (ZOFRAN) injection 4 mg  4 mg Intravenous Q4H PRN Ricki Rodriguez, MD      . oxyCODONE (Oxy IR/ROXICODONE) immediate release tablet 10 mg  10 mg Oral Q6H PRN Gwenyth Bender, MD      . pantoprazole (PROTONIX) EC tablet 40 mg  40 mg Oral BID Keitha Butte, NP      . polyethylene glycol (MIRALAX / GLYCOLAX) packet 17 g  17 g Oral Daily Keitha Butte, NP      . zolpidem (AMBIEN) tablet 5 mg  5 mg Oral QHS PRN,MR X 1 Ricki Rodriguez, MD      . DISCONTD: 0.9 %  sodium chloride infusion   Intravenous Continuous Sunnie Nielsen, MD 125 mL/hr at 11/20/11 2219 125 mL at 11/20/11 2219  . DISCONTD: docusate sodium (COLACE) capsule 100 mg  100 mg Oral BID Ricki Rodriguez, MD   100 mg at 11/21/11 1033  . DISCONTD:  famotidine (PEPCID) IVPB 20 mg  20 mg Intravenous BID Ricki Rodriguez, MD  20 mg at 11/19/11 2259  . DISCONTD: famotidine (PEPCID) tablet 20 mg  20 mg Oral BID Ricki Rodriguez, MD   20 mg at 11/21/11 1033  . DISCONTD: HYDROmorphone (DILAUDID) injection 2 mg  2 mg Intravenous Q2H PRN Ricki Rodriguez, MD   2 mg at 11/20/11 1043  . DISCONTD: HYDROmorphone (DILAUDID) injection 2 mg  2 mg Intravenous Q3H PRN Ricki Rodriguez, MD   2 mg at 11/21/11 0758  . DISCONTD: hydroxyurea (HYDREA) capsule 1,000 mg  1,000 mg Oral Daily Keitha Butte, NP      . DISCONTD: Oxycodone HCl TABS 10 mg  10 mg Oral Q6H PRN Keitha Butte, NP      . DISCONTD: pantoprazole (PROTONIX) EC tablet 40 mg  40 mg Oral Q1200 Keitha Butte, NP      . DISCONTD: senna-docusate (Senokot-S) tablet 2 tablet  2 tablet Oral QHS Keitha Butte, NP        Assessment/Plan: Patient Active Problem List  Diagnosis  . SICKLE CELL ANEMIA  . TRICHOTILLOMANIA  . Active smoker  . Back pain  . Depression  . GERD (gastroesophageal reflux disease)  . Contraception management  . Stress  . Overweight  . Vaso-occlusive sickle cell crisis  . Right thigh pain    Sickle Cell Crisis:  Additional laboratories were ordered to determine if the patient is in active hemolysis or not.  The patient will receive pain/nausea/pruritis/bowel management, IV hydration, Folic Acid, Hydroxyurea and GI/DVT prophylaxis.  PT has been asked to evaluate/treat the patient for her bilateral LE pain.  CMP and CBC in the am.  Hemoglobinopathy, Ferritin, TBili, Mg, Phos and ProBNP are pending. Trichotillomania/Anxiety/Depression/possible ADHD:  The patient is receiving Luvox 50 mg PO QHS. GERD:  The patient is receiving Protonix BID Leukocytosis:  Blood cultures x 2 have been ordered.  The patient will be started on antibiotic therapy with Levaquin, has scheduled nebulizer treatments, frequent IS usage is encouraged.  CXR ordered.     Discussed and agreed  upon plan of care with the patient.   The plan of care will be adjusted based on the patient's clinical progress.  Larina Bras, NP-C 11/21/2011, 11:48 AM

## 2011-11-22 DIAGNOSIS — N760 Acute vaginitis: Secondary | ICD-10-CM

## 2011-11-22 DIAGNOSIS — R000 Tachycardia, unspecified: Secondary | ICD-10-CM

## 2011-11-22 LAB — CBC
Hemoglobin: 9.7 g/dL — ABNORMAL LOW (ref 12.0–15.0)
MCHC: 34.8 g/dL (ref 30.0–36.0)
RDW: 15.9 % — ABNORMAL HIGH (ref 11.5–15.5)
WBC: 17.8 10*3/uL — ABNORMAL HIGH (ref 4.0–10.5)

## 2011-11-22 LAB — COMPREHENSIVE METABOLIC PANEL
ALT: 12 U/L (ref 0–35)
Albumin: 3.7 g/dL (ref 3.5–5.2)
Alkaline Phosphatase: 65 U/L (ref 39–117)
Chloride: 103 mEq/L (ref 96–112)
Glucose, Bld: 115 mg/dL — ABNORMAL HIGH (ref 70–99)
Potassium: 4.2 mEq/L (ref 3.5–5.1)
Sodium: 135 mEq/L (ref 135–145)
Total Bilirubin: 2.2 mg/dL — ABNORMAL HIGH (ref 0.3–1.2)
Total Protein: 7.2 g/dL (ref 6.0–8.3)

## 2011-11-22 MED ORDER — METOPROLOL TARTRATE 12.5 MG HALF TABLET
12.5000 mg | ORAL_TABLET | Freq: Two times a day (BID) | ORAL | Status: DC
  Administered 2011-11-22 – 2011-11-24 (×4): 12.5 mg via ORAL
  Filled 2011-11-22 (×5): qty 1

## 2011-11-22 MED ORDER — AZITHROMYCIN 250 MG PO TABS: 250.0000 mg | ORAL_TABLET | Freq: Every day | ORAL | Status: DC

## 2011-11-22 MED ORDER — DIPHENHYDRAMINE-ZINC ACETATE 2-0.1 % EX CREA
TOPICAL_CREAM | Freq: Every day | CUTANEOUS | Status: DC | PRN
  Filled 2011-11-22: qty 28.4

## 2011-11-22 MED ORDER — FLUCONAZOLE 200 MG PO TABS
200.0000 mg | ORAL_TABLET | Freq: Every day | ORAL | Status: AC
  Administered 2011-11-22 – 2011-11-24 (×3): 200 mg via ORAL
  Filled 2011-11-22 (×3): qty 1

## 2011-11-22 MED ORDER — CLINDAMYCIN HCL 300 MG PO CAPS
300.0000 mg | ORAL_CAPSULE | Freq: Two times a day (BID) | ORAL | Status: DC
  Administered 2011-11-22 – 2011-11-27 (×10): 300 mg via ORAL
  Filled 2011-11-22 (×14): qty 1

## 2011-11-22 MED ORDER — SODIUM CHLORIDE 0.45 % IV SOLN
INTRAVENOUS | Status: DC
  Administered 2011-11-22: 12:00:00 via INTRAVENOUS
  Administered 2011-11-23: 50 mL/h via INTRAVENOUS
  Administered 2011-11-25: 22:00:00 via INTRAVENOUS
  Administered 2011-11-26: 1000 mL via INTRAVENOUS
  Administered 2011-11-27: 19:00:00 via INTRAVENOUS

## 2011-11-22 MED ORDER — AZITHROMYCIN 500 MG PO TABS
500.0000 mg | ORAL_TABLET | Freq: Every day | ORAL | Status: DC
  Filled 2011-11-22: qty 1

## 2011-11-22 MED ORDER — WITCH HAZEL-GLYCERIN EX PADS
MEDICATED_PAD | CUTANEOUS | Status: DC | PRN
Start: 2011-11-22 — End: 2011-11-28
  Filled 2011-11-22: qty 100

## 2011-11-22 MED ORDER — DEXTROSE 5 % IV SOLN
1.0000 g | INTRAVENOUS | Status: DC
  Administered 2011-11-22 – 2011-11-27 (×5): 1 g via INTRAVENOUS
  Filled 2011-11-22 (×8): qty 10

## 2011-11-22 NOTE — Progress Notes (Signed)
[facility] Progress Note    Subjective: The patient was seen on rounds today.  The patient was sitting quietly in her bed.  The patient is complaining of pain 7/10 in her bilateral LEs (from her inguinal area to her ankles) and diffuse back pain.  The patient also stated that she has noticed a discharge, odor and itching in her vaginal area. Reviewed the patient's lab and CXR results with her.  Had a lengthy discussion with the patient about her life and the hardships that she has faced.  The patient stated that it feel better when she able to discuss her life and she would like to continue the conversation with the LCSW.  No other nursing or patient concerns at this time.   Objective: Vital signs in last 24 hours: Blood pressure 131/80, pulse 111, temperature 98.3 F (36.8 C), temperature source Oral, resp. rate 18, height 5\' 3"  (1.6 m), weight 182 lb (82.555 kg), last menstrual period 11/04/2011, SpO2 100.00%, not currently breastfeeding.  General Appearance: Alert, cooperative, well nourished, well developed, mild distress Head: Normocephalic, without obvious abnormality, atraumatic, sparse hair on the patient's head Eyes: PERRLA, EOM's intact, scleral icterus Nose: Nares, septum and mucosa are normal, no drainage or sinus tenderness Throat: Lips, mucosa, and tongue are normal,  teeth and gums normal Back: Symmetric, no curvature, impaired ROM, bilateral CVA tenderness Cardio: Regular rate and rhythm, S1, S2 normal, no murmur/click/rub/gallop, tender midsternal area with palpation GI: Soft, distended, non-tender, hypoactive bowel sounds, no organomegaly GU:  Contact dermatitis/irritation in the lower inguinal/vaginal area, erythema Extremities: Extremities normal, atraumatic, no cyanosis, no edema, Homans sign is negative, no sign of DVT, tender bilateral LEs, limited ROM bilateral LEs Pulses: 2+ and symmetric Skin: Skin color, texture, turgor normal,  no rashes or  lesions Neurologic: Grossly normal, AO x 3, no focal deficits, CN II - XII intact Psych:  Appropriate affect, tearful at times  Lab Results: Results for orders placed during the hospital encounter of 11/19/11 (from the past 24 hour(s))  CBC     Status: Abnormal   Collection Time   11/22/11  6:25 AM      Component Value Range   WBC 17.8 (*) 4.0 - 10.5 K/uL   RBC 3.03 (*) 3.87 - 5.11 MIL/uL   Hemoglobin 9.7 (*) 12.0 - 15.0 g/dL   HCT 16.1 (*) 09.6 - 04.5 %   MCV 92.1  78.0 - 100.0 fL   MCH 32.0  26.0 - 34.0 pg   MCHC 34.8  30.0 - 36.0 g/dL   RDW 40.9 (*) 81.1 - 91.4 %   Platelets 383  150 - 400 K/uL  COMPREHENSIVE METABOLIC PANEL     Status: Abnormal   Collection Time   11/22/11  6:25 AM      Component Value Range   Sodium 135  135 - 145 mEq/L   Potassium 4.2  3.5 - 5.1 mEq/L   Chloride 103  96 - 112 mEq/L   CO2 24  19 - 32 mEq/L   Glucose, Bld 115 (*) 70 - 99 mg/dL   BUN 10  6 - 23 mg/dL   Creatinine, Ser 7.82  0.50 - 1.10 mg/dL   Calcium 9.2  8.4 - 95.6 mg/dL   Total Protein 7.2  6.0 - 8.3 g/dL   Albumin 3.7  3.5 - 5.2 g/dL   AST 21  0 - 37 U/L   ALT 12  0 - 35 U/L   Alkaline Phosphatase 65  39 - 117 U/L   Total Bilirubin 2.2 (*) 0.3 - 1.2 mg/dL   GFR calc non Af Amer >90  >90 mL/min   GFR calc Af Amer >90  >90 mL/min    Studies/Results: Dg Chest 2 View  11/21/2011  *RADIOLOGY REPORT*  Clinical Data: 21 year old female with sickle cell disease.  Right lower extremity pain.  CHEST - 2 VIEW  Comparison: 10/06/2011 and earlier.  Findings: Stable lung volumes.  Cardiac size and mediastinal contours are within normal limits.  Visualized tracheal air column is within normal limits.  No pneumothorax, pulmonary edema, pleural effusion or acute pulmonary opacity.  Interval decreased vascularity.  Stable mild right suprahilar scarring.  Bony changes of sickle cell disease throughout the visible spine.  IMPRESSION: No acute cardiopulmonary abnormality.  Stable sequelae of sickle cell  disease.  Original Report Authenticated By: Harley Hallmark, M.D.     Medications: Allergies  Allergen Reactions  . Ketorolac Tromethamine Hives and Itching  . Toradol (Ketorolac Tromethamine) Hives and Itching    Current Facility-Administered Medications  Medication Dose Route Frequency Provider Last Rate Last Dose  . 0.45 % sodium chloride infusion   Intravenous Continuous Keitha Butte, NP 50 mL/hr at 11/22/11 1223    . acidophilus (RISAQUAD) capsule 2 capsule  2 capsule Oral Daily Keitha Butte, NP   2 capsule at 11/22/11 1055  . ALPRAZolam (XANAX) tablet 0.25-0.5 mg  0.25-0.5 mg Oral Q3H PRN Ricki Rodriguez, MD      . cefTRIAXone (ROCEPHIN) 1 g in dextrose 5 % 50 mL IVPB  1 g Intravenous Q24H Keitha Butte, NP   1 g at 11/22/11 1223  . cholecalciferol (VITAMIN D) tablet 1,000 Units  1,000 Units Oral Daily Keitha Butte, NP   1,000 Units at 11/22/11 1054  . clindamycin (CLEOCIN) capsule 300 mg  300 mg Oral Q12H Keitha Butte, NP      . cyclobenzaprine (FLEXERIL) tablet 5 mg  5 mg Oral BID PRN Keitha Butte, NP      . diphenhydrAMINE (BENADRYL) capsule 25-50 mg  25-50 mg Oral Q4H PRN Ricki Rodriguez, MD       Or  . diphenhydrAMINE (BENADRYL) injection 12.5-25 mg  12.5-25 mg Intravenous Q4H PRN Ricki Rodriguez, MD   25 mg at 11/19/11 1139  . diphenhydrAMINE-zinc acetate (BENADRYL) 2-0.1 % cream   Topical Daily PRN Keitha Butte, NP      . enoxaparin (LOVENOX) injection 40 mg  40 mg Subcutaneous Q24H Keitha Butte, NP   40 mg at 11/21/11 2119  . fluconazole (DIFLUCAN) tablet 200 mg  200 mg Oral Daily Keitha Butte, NP   200 mg at 11/22/11 1055  . fluvoxaMINE (LUVOX) tablet 50 mg  50 mg Oral QHS Keitha Butte, NP   50 mg at 11/21/11 2116  . folic acid (FOLVITE) tablet 1 mg  1 mg Oral Daily Ricki Rodriguez, MD   1 mg at 11/22/11 1054  . HYDROmorphone (DILAUDID) 1 MG/ML injection        2 mg at 11/21/11 1408  . HYDROmorphone (DILAUDID)  injection 2-4 mg  2-4 mg Intravenous Q2H PRN Keitha Butte, NP   2 mg at 11/22/11 1106  . hydroxyurea (HYDREA) capsule 500 mg  500 mg Oral BID Ricki Rodriguez, MD   500 mg at 11/22/11 1054  . hydrOXYzine (ATARAX/VISTARIL) tablet 25 mg  25 mg Oral TID Keitha Butte, NP   25 mg  at 11/22/11 1054  . levalbuterol (XOPENEX) nebulizer solution 0.63 mg  0.63 mg Nebulization BID Keitha Butte, NP   0.63 mg at 11/21/11 1613  . lidocaine (LIDODERM) 5 % 2 patch  2 patch Transdermal Q24H Keitha Butte, NP   1 patch at 11/22/11 1221  . ondansetron (ZOFRAN) tablet 4 mg  4 mg Oral Q4H PRN Ricki Rodriguez, MD       Or  . ondansetron (ZOFRAN) injection 4 mg  4 mg Intravenous Q4H PRN Ricki Rodriguez, MD   4 mg at 11/21/11 1230  . oxyCODONE (Oxy IR/ROXICODONE) immediate release tablet 10 mg  10 mg Oral Q6H PRN Gwenyth Bender, MD      . pantoprazole (PROTONIX) EC tablet 40 mg  40 mg Oral BID Keitha Butte, NP   40 mg at 11/22/11 0912  . polyethylene glycol (MIRALAX / GLYCOLAX) packet 17 g  17 g Oral Daily Keitha Butte, NP   17 g at 11/22/11 1055  . witch hazel-glycerin (TUCKS) pad   Topical PRN Keitha Butte, NP      . zolpidem (AMBIEN) tablet 5 mg  5 mg Oral QHS PRN,MR X 1 Ricki Rodriguez, MD      . DISCONTD: azithromycin (ZITHROMAX) tablet 250 mg  250 mg Oral Daily Keitha Butte, NP      . DISCONTD: azithromycin (ZITHROMAX) tablet 500 mg  500 mg Oral Daily Keitha Butte, NP      . DISCONTD: dextrose 5 %-0.45 % sodium chloride infusion   Intravenous Continuous Keitha Butte, NP 50 mL/hr at 11/22/11 1056    . DISCONTD: levofloxacin (LEVAQUIN) tablet 750 mg  750 mg Oral Q24H Keitha Butte, NP   750 mg at 11/21/11 1418    Assessment/Plan: Patient Active Problem List  Diagnosis  . SICKLE CELL ANEMIA  . TRICHOTILLOMANIA  . Active smoker  . Back pain  . Depression  . GERD (gastroesophageal reflux disease)  . Contraception management  . Stress  .  Overweight  . Vaso-occlusive sickle cell crisis  . Right thigh pain  . Vaginosis  . Tachycardia    Sickle Cell Crisis:  The patient is in active hemolysis.  The patient will receive pain/nausea/pruritis/bowel management, IV hydration, Folic Acid, Hydroxyurea and GI/DVT prophylaxis.  PT has been asked to evaluate/treat the patient for her bilateral LE pain and chronic back pain.  CMP and CBC in the am.  Hemoglobinopathy and Ferritin are pending. Trichotillomania/Anxiety/Depression/possible ADHD:  The patient is receiving Luvox 50 mg PO QHS. GERD:  The patient is receiving Protonix BID Leukocytosis (Sickle Lung suspected)/Vaginosis:  The patient's antibiotic therapy has been changed to Rocephin and Clindamycin with Diflucan, has scheduled nebulizer treatments, frequent IS usage is encouraged.  Tachycardia:  The patient will be started on low-dose Metoprolol BID with holding parameters    Discussed and agreed upon plan of care with the patient.   The plan of care will be adjusted based on the patient's clinical progress.  Larina Bras, NP-C 11/22/2011, 12:31 PM

## 2011-11-23 LAB — COMPREHENSIVE METABOLIC PANEL
ALT: 15 U/L (ref 0–35)
BUN: 10 mg/dL (ref 6–23)
CO2: 26 mEq/L (ref 19–32)
Calcium: 9.1 mg/dL (ref 8.4–10.5)
Creatinine, Ser: 0.69 mg/dL (ref 0.50–1.10)
GFR calc Af Amer: >90 mL/min (ref >90)
GFR calc non Af Amer: >90 mL/min (ref >90)
Glucose, Bld: 94 mg/dL (ref 70–99)

## 2011-11-23 LAB — HEMOGLOBINOPATHY EVALUATION
Hgb A2 Quant: 2.6 % (ref 2.2–3.2)
Hgb A: 19.5 % — ABNORMAL LOW (ref 96.8–97.8)

## 2011-11-23 LAB — URINALYSIS, ROUTINE W REFLEX MICROSCOPIC
Bilirubin Urine: NEGATIVE
Hgb urine dipstick: NEGATIVE
Specific Gravity, Urine: 1.019 (ref 1.005–1.030)
pH: 5.5 (ref 5.0–8.0)

## 2011-11-23 LAB — CBC
HCT: 26 % — ABNORMAL LOW (ref 36.0–46.0)
Hemoglobin: 9.1 g/dL — ABNORMAL LOW (ref 12.0–15.0)
MCH: 31.9 pg (ref 26.0–34.0)
MCV: 91.2 fL (ref 78.0–100.0)
RBC: 2.85 MIL/uL — ABNORMAL LOW (ref 3.87–5.11)

## 2011-11-23 NOTE — Progress Notes (Signed)
Patient told the housekeeping staff not to clean his room today and would not allow them into room

## 2011-11-23 NOTE — Progress Notes (Signed)
Subjective:  Patient reports she was feeling somewhat worse today than she was yesterday. She rates her pain as a 7/10. There's been no new chest pains or shortness of breath. She still has pain in the lower back and more aching pain in her right hip. Pain does not travel down the leg. No other new complaints. She reports her present medications controlling her pain. She would not able to manage her pain with her present oral medication. The patient again discussed a desire to change her habits. She acknowledges that she drinks some line prior to becoming ill on this occasion.   Allergies  Allergen Reactions  . Ketorolac Tromethamine Hives and Itching  . Toradol (Ketorolac Tromethamine) Hives and Itching   Current Facility-Administered Medications  Medication Dose Route Frequency Provider Last Rate Last Dose  . 0.45 % sodium chloride infusion   Intravenous Continuous Keitha Butte, NP 50 mL/hr at 11/23/11 0831 50 mL/hr at 11/23/11 0831  . acidophilus (RISAQUAD) capsule 2 capsule  2 capsule Oral Daily Keitha Butte, NP   2 capsule at 11/23/11 1123  . ALPRAZolam (XANAX) tablet 0.25-0.5 mg  0.25-0.5 mg Oral Q3H PRN Ricki Rodriguez, MD      . cefTRIAXone (ROCEPHIN) 1 g in dextrose 5 % 50 mL IVPB  1 g Intravenous Q24H Keitha Butte, NP   1 g at 11/23/11 1139  . cholecalciferol (VITAMIN D) tablet 1,000 Units  1,000 Units Oral Daily Keitha Butte, NP   1,000 Units at 11/23/11 1122  . clindamycin (CLEOCIN) capsule 300 mg  300 mg Oral Q12H Keitha Butte, NP   300 mg at 11/23/11 1235  . cyclobenzaprine (FLEXERIL) tablet 5 mg  5 mg Oral BID PRN Keitha Butte, NP      . diphenhydrAMINE (BENADRYL) capsule 25-50 mg  25-50 mg Oral Q4H PRN Ricki Rodriguez, MD       Or  . diphenhydrAMINE (BENADRYL) injection 12.5-25 mg  12.5-25 mg Intravenous Q4H PRN Ricki Rodriguez, MD   25 mg at 11/19/11 1139  . diphenhydrAMINE-zinc acetate (BENADRYL) 2-0.1 % cream   Topical Daily PRN  Keitha Butte, NP      . enoxaparin (LOVENOX) injection 40 mg  40 mg Subcutaneous Q24H Keitha Butte, NP   40 mg at 11/21/11 2119  . fluconazole (DIFLUCAN) tablet 200 mg  200 mg Oral Daily Keitha Butte, NP   200 mg at 11/23/11 1122  . fluvoxaMINE (LUVOX) tablet 50 mg  50 mg Oral QHS Keitha Butte, NP   50 mg at 11/22/11 2100  . folic acid (FOLVITE) tablet 1 mg  1 mg Oral Daily Ricki Rodriguez, MD   1 mg at 11/23/11 1123  . HYDROmorphone (DILAUDID) injection 2-4 mg  2-4 mg Intravenous Q2H PRN Keitha Butte, NP   4 mg at 11/23/11 1431  . hydroxyurea (HYDREA) capsule 500 mg  500 mg Oral BID Ricki Rodriguez, MD   500 mg at 11/23/11 1123  . hydrOXYzine (ATARAX/VISTARIL) tablet 25 mg  25 mg Oral TID Keitha Butte, NP   25 mg at 11/23/11 1123  . levalbuterol (XOPENEX) nebulizer solution 0.63 mg  0.63 mg Nebulization BID Keitha Butte, NP   0.63 mg at 11/23/11 1259  . lidocaine (LIDODERM) 5 % 2 patch  2 patch Transdermal Q24H Keitha Butte, NP   2 patch at 11/23/11 1234  . metoprolol tartrate (LOPRESSOR) tablet 12.5 mg  12.5 mg Oral BID Adela Lank  A Hunter, NP   12.5 mg at 11/23/11 1122  . ondansetron (ZOFRAN) tablet 4 mg  4 mg Oral Q4H PRN Ricki Rodriguez, MD       Or  . ondansetron (ZOFRAN) injection 4 mg  4 mg Intravenous Q4H PRN Ricki Rodriguez, MD   4 mg at 11/21/11 1230  . oxyCODONE (Oxy IR/ROXICODONE) immediate release tablet 10 mg  10 mg Oral Q6H PRN Gwenyth Bender, MD      . pantoprazole (PROTONIX) EC tablet 40 mg  40 mg Oral BID Keitha Butte, NP   40 mg at 11/23/11 0831  . polyethylene glycol (MIRALAX / GLYCOLAX) packet 17 g  17 g Oral Daily Keitha Butte, NP   17 g at 11/23/11 1121  . witch hazel-glycerin (TUCKS) pad   Topical PRN Keitha Butte, NP      . zolpidem (AMBIEN) tablet 5 mg  5 mg Oral QHS PRN,MR X 1 Ricki Rodriguez, MD        Objective: Blood pressure 98/44, pulse 109, temperature 97.7 F (36.5 C), temperature source  Oral, resp. rate 18, height 5\' 3"  (1.6 m), weight 182 lb (82.555 kg), last menstrual period 11/04/2011, SpO2 95.00%, not currently breastfeeding.  Well-developed well-nourished overweight black female in no acute distress. HEENT: No sinus tenderness. Minimal sclera icterus. NECK: No enlarged thyroid. No posterior cervical nodes. In LUNGS: Clear to auscultation. No vocal fremitus. CV: Normal S1, S2 without S3. ABD: No suprapubic tenderness. MSK: Mild right hip discomfort to direct palpation. Negative left. Negative Homans. No edema. NEURO: Intact.  Lab results: Results for orders placed during the hospital encounter of 11/19/11 (from the past 48 hour(s))  CBC     Status: Abnormal   Collection Time   11/22/11  6:25 AM      Component Value Range Comment   WBC 17.8 (*) 4.0 - 10.5 K/uL    RBC 3.03 (*) 3.87 - 5.11 MIL/uL    Hemoglobin 9.7 (*) 12.0 - 15.0 g/dL    HCT 16.1 (*) 09.6 - 46.0 %    MCV 92.1  78.0 - 100.0 fL    MCH 32.0  26.0 - 34.0 pg    MCHC 34.8  30.0 - 36.0 g/dL    RDW 04.5 (*) 40.9 - 15.5 %    Platelets 383  150 - 400 K/uL   COMPREHENSIVE METABOLIC PANEL     Status: Abnormal   Collection Time   11/22/11  6:25 AM      Component Value Range Comment   Sodium 135  135 - 145 mEq/L    Potassium 4.2  3.5 - 5.1 mEq/L    Chloride 103  96 - 112 mEq/L    CO2 24  19 - 32 mEq/L    Glucose, Bld 115 (*) 70 - 99 mg/dL    BUN 10  6 - 23 mg/dL    Creatinine, Ser 8.11  0.50 - 1.10 mg/dL    Calcium 9.2  8.4 - 91.4 mg/dL    Total Protein 7.2  6.0 - 8.3 g/dL    Albumin 3.7  3.5 - 5.2 g/dL    AST 21  0 - 37 U/L    ALT 12  0 - 35 U/L    Alkaline Phosphatase 65  39 - 117 U/L    Total Bilirubin 2.2 (*) 0.3 - 1.2 mg/dL    GFR calc non Af Amer >90  >90 mL/min    GFR calc Af Amer >90  >  90 mL/min   FERRITIN     Status: Abnormal   Collection Time   11/22/11  6:25 AM      Component Value Range Comment   Ferritin 390 (*) 10 - 291 ng/mL   URINALYSIS, ROUTINE W REFLEX MICROSCOPIC     Status: Normal    Collection Time   11/23/11  6:45 AM      Component Value Range Comment   Color, Urine YELLOW  YELLOW    APPearance CLEAR  CLEAR    Specific Gravity, Urine 1.019  1.005 - 1.030    pH 5.5  5.0 - 8.0    Glucose, UA NEGATIVE  NEGATIVE mg/dL    Hgb urine dipstick NEGATIVE  NEGATIVE    Bilirubin Urine NEGATIVE  NEGATIVE    Ketones, ur NEGATIVE  NEGATIVE mg/dL    Protein, ur NEGATIVE  NEGATIVE mg/dL    Urobilinogen, UA 1.0  0.0 - 1.0 mg/dL    Nitrite NEGATIVE  NEGATIVE    Leukocytes, UA NEGATIVE  NEGATIVE MICROSCOPIC NOT DONE ON URINES WITH NEGATIVE PROTEIN, BLOOD, LEUKOCYTES, NITRITE, OR GLUCOSE <1000 mg/dL.  CBC     Status: Abnormal   Collection Time   11/23/11  8:15 AM      Component Value Range Comment   WBC 15.6 (*) 4.0 - 10.5 K/uL    RBC 2.85 (*) 3.87 - 5.11 MIL/uL    Hemoglobin 9.1 (*) 12.0 - 15.0 g/dL    HCT 16.1 (*) 09.6 - 46.0 %    MCV 91.2  78.0 - 100.0 fL    MCH 31.9  26.0 - 34.0 pg    MCHC 35.0  30.0 - 36.0 g/dL    RDW 04.5 (*) 40.9 - 15.5 %    Platelets 407 (*) 150 - 400 K/uL   COMPREHENSIVE METABOLIC PANEL     Status: Abnormal   Collection Time   11/23/11  8:15 AM      Component Value Range Comment   Sodium 135  135 - 145 mEq/L    Potassium 4.1  3.5 - 5.1 mEq/L    Chloride 99  96 - 112 mEq/L    CO2 26  19 - 32 mEq/L    Glucose, Bld 94  70 - 99 mg/dL    BUN 10  6 - 23 mg/dL    Creatinine, Ser 8.11  0.50 - 1.10 mg/dL    Calcium 9.1  8.4 - 91.4 mg/dL    Total Protein 6.8  6.0 - 8.3 g/dL    Albumin 3.6  3.5 - 5.2 g/dL    AST 25  0 - 37 U/L    ALT 15  0 - 35 U/L    Alkaline Phosphatase 64  39 - 117 U/L    Total Bilirubin 2.4 (*) 0.3 - 1.2 mg/dL    GFR calc non Af Amer >90  >90 mL/min    GFR calc Af Amer >90  >90 mL/min     Studies/Results: No results found.  Patient Active Problem List  Diagnosis  . SICKLE CELL ANEMIA  . TRICHOTILLOMANIA  . Active smoker  . Back pain  . Depression  . GERD (gastroesophageal reflux disease)  . Contraception management  .  Stress  . Overweight  . Vaso-occlusive sickle cell crisis  . Right thigh pain  . Vaginosis  . Tachycardia    Impression: Sickle cell crisis. 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 Lower back and right hip pain. At risk for avascular necrosis. 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 Tobacco abuse history. Anxiety disorder. Sickle cell lung disease.   Plan: Continue present supportive measures. Repeat CBC, CMET in a.m. Option for exchange transfusion if symptoms not improved. Repeat x-ray of right hip. Patient is aware does require PICC line to do this.   August Saucer, Kory Panjwani 11/23/2011 03:23 PM

## 2011-11-23 NOTE — Social Work (Signed)
Clinical Social Work Department BRIEF PSYCHOSOCIAL ASSESSMENT 11/23/2011  Patient:  ALEGANDRA, SOMMERS     Account Number:  [id number]     Admit date:  11/19/2011  Clinical Social Worker:  Eddie Candle  Date/Time:  11/23/2011 12:00 N  Referred by:  Physician  Date Referred:  11/23/2011 Referred for  Behavioral Health Issues   Other Referral:   Interview type:  Patient Other interview type:    PSYCHOSOCIAL DATA Living Status:  FAMILY Admitted from facility:   Level of care:   Primary support name:  mother Primary support relationship to patient:  PARENT Degree of support available:   limited according to patient    CURRENT CONCERNS Current Concerns  Behavioral Health Issues   Other Concerns:    SOCIAL WORK ASSESSMENT / PLAN Clinical social worker met with patient who identified a host of challenges with emotions and behaviors, some of which have been apparent from childhood.  CSW provided patient with support and will contact other supports for more assistance.   Assessment/plan status:  Referral to [facility] Other assessment/ plan:   Information/referral to community resources:   CSW will contact [facility] about housing and other supports  CSW will contact [facility] about psych services    PATIENT'S/FAMILY'S RESPONSE TO PLAN OF CARE: Patient agreed and appreciative of visit

## 2011-11-23 NOTE — Progress Notes (Signed)
PT Cancellation Note  Treatment cancelled today due to medical issues with patient which prohibited therapy.Resting HR 119, RN notified, she will page MD.  Will await MD response prior to initiating ambulation. Will follow.  Ralene Bathe Kistler 11/23/2011, 1:07 PM [phone]

## 2011-11-23 NOTE — Evaluation (Signed)
Physical Therapy Evaluation Patient Details Name: [name] MRN: [id number] DOB: [birth year] Today's Date: 11/23/2011 Time: 1400-1430 PT Time Calculation (min): 30 min  PT Assessment / Plan / Recommendation Clinical Impression  21 y.o. female admitted with sickle cell crisis, and RLE pain. Pt also reports back pain which limits standing and sitting tolerance. Pt ambulated 120' with RW and min/guard assist. RW for home recommended. Pt will likely not need post acute PT. She would benefit from acute PT to maximize safety and independence with mobility.    PT Assessment  Patient needs continued PT services    Follow Up Recommendations  No PT follow up    Barriers to Discharge None      Equipment Recommendations  Rolling walker with 5" wheels    Recommendations for Other Services     Frequency Min 3X/week    Precautions / Restrictions Precautions Precautions: None Restrictions Weight Bearing Restrictions: No   Pertinent Vitals/Pain **7/10 upper R back and R groin Heating pad applied, tennis ball massage to R upper back, pain meds requested from RN HR 103 after walking*      Mobility  Bed Mobility Bed Mobility: Supine to Sit Supine to Sit: 6: Modified independent (Device/Increase time);With rails;HOB flat Transfers Transfers: Sit to Stand;Stand to Sit Sit to Stand: 6: Modified independent (Device/Increase time);From bed;With upper extremity assist Stand to Sit: 6: Modified independent (Device/Increase time);With armrests;To chair/3-in-1;With upper extremity assist Ambulation/Gait Ambulation/Gait Assistance: 4: Min guard Ambulation Distance (Feet): 120 Feet Assistive device: Rolling walker Ambulation/Gait Assistance Details: VCs for positioning in RW, pt steps too far behind frame of RW Gait Pattern: Within Functional Limits    Exercises     PT Diagnosis: Difficulty walking;Acute pain  PT Problem List: Decreased knowledge of use of DME;Decreased activity  tolerance;Pain PT Treatment Interventions: Gait training;Functional mobility training;DME instruction;Patient/family education   PT Goals Acute Rehab PT Goals PT Goal Formulation: With patient Time For Goal Achievement: 11/30/11 Potential to Achieve Goals: Good Pt will Ambulate: >150 feet;with modified independence;with least restrictive assistive device PT Goal: Ambulate - Progress: Goal set today  Visit Information  Last PT Received On: 11/23/11 Assistance Needed: +1    Subjective Data  Subjective: My back and R groin hurt. My boyfriend proposed to me last night! 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 Communication Communication: No difficulties    Cognition  Overall Cognitive Status: Appears within functional limits for tasks assessed/performed Arousal/Alertness: Awake/alert Orientation Level: Appears intact for tasks assessed Behavior During Session: [facility] for tasks performed    Extremity/Trunk Assessment Right Upper Extremity Assessment RUE ROM/Strength/Tone: [facility] for tasks assessed Left Upper Extremity Assessment LUE ROM/Strength/Tone: WFL for tasks assessed Right Lower Extremity Assessment RLE ROM/Strength/Tone: Within functional levels RLE Sensation: WFL - Light Touch RLE Coordination: WFL - gross/fine motor Left Lower Extremity Assessment LLE ROM/Strength/Tone: Within functional levels LLE Sensation: WFL - Light Touch LLE Coordination: WFL - gross/fine motor Trunk Assessment Trunk Assessment: Normal   Balance    End of Session PT - End of Session Equipment Utilized During Treatment: Gait belt Patient left: in bed Nurse Communication: Mobility status  GP       Ralene Bathe Kistler 11/23/2011, 2:44 PM [phone]

## 2011-11-24 ENCOUNTER — Inpatient Hospital Stay (HOSPITAL_COMMUNITY): Payer: Medicaid Other

## 2011-11-24 LAB — COMPREHENSIVE METABOLIC PANEL
ALT: 19 U/L (ref 0–35)
AST: 27 U/L (ref 0–37)
Albumin: 3.5 g/dL (ref 3.5–5.2)
Alkaline Phosphatase: 61 U/L (ref 39–117)
BUN: 10 mg/dL (ref 6–23)
Potassium: 3.9 mEq/L (ref 3.5–5.1)
Sodium: 135 mEq/L (ref 135–145)
Total Protein: 6.5 g/dL (ref 6.0–8.3)

## 2011-11-24 LAB — CBC
Hemoglobin: 8.8 g/dL — ABNORMAL LOW (ref 12.0–15.0)
MCHC: 34.9 g/dL (ref 30.0–36.0)

## 2011-11-24 MED ORDER — METOPROLOL SUCCINATE 12.5 MG HALF TABLET
12.5000 mg | ORAL_TABLET | Freq: Every day | ORAL | Status: DC
  Administered 2011-11-24 – 2011-11-28 (×5): 12.5 mg via ORAL
  Filled 2011-11-24 (×5): qty 1

## 2011-11-24 MED ORDER — DIPHENHYDRAMINE HCL 50 MG/ML IJ SOLN: 25.0000 mg | Freq: Once | INTRAMUSCULAR | Status: DC

## 2011-11-24 MED ORDER — ACETAMINOPHEN 325 MG PO TABS: 650.0000 mg | ORAL_TABLET | Freq: Once | ORAL | Status: DC

## 2011-11-24 NOTE — Progress Notes (Signed)
Sickle Cell Medical Service Progress Note    Subjective: The patient was seen on rounds today.  The patient was resting quietly in her bed, but was easily aroused.  The patient is complaining of pain 6/10 in her bilateral LEs and diffuse back pain.   Dr. August Saucer has ordered a hip Xray to further investigate the source of the patient's LE pain and a DME rolling walker order was placed for the patient to have a walker for home use.  The patient stated that her vaginal irritation has improved.   The patient has agreed to receiving a blood exchange, but declined receiving a PICC line.  A blood exchange has been ordered, but may or may not be able to be completed through a peripheral IV.  If blood is not able to be drawn off for the blood exchange, then nursing asked to cancel the blood exchange.  No other nursing or patient concerns at this time.   Objective: Vital signs in last 24 hours: Blood pressure 101/46, pulse 102, temperature 98.2 F (36.8 C), temperature source Oral, resp. rate 20, height 5\' 3"  (1.6 m), weight 182 lb (82.555 kg), last menstrual period 11/04/2011, SpO2 96.00%, not currently breastfeeding.  General Appearance: Drowsy but oriented, cooperative, well nourished, well developed, no apparent distress Head: Normocephalic, without obvious abnormality, atraumatic, sparse hair on the patient's head Eyes: PERRLA, EOM's intact, scleral icterus Nose: Nares, septum and mucosa are normal, no drainage or sinus tenderness Throat: Lips, mucosa, and tongue are normal,  teeth and gums normal Back: Symmetric, no curvature, impaired ROM, bilateral CVA tenderness, diffuse tenderness  Cardio: Regular rate and rhythm, S1, S2 normal, tachycardic at times, no murmur/click/rub/gallop GI: Soft, distended, non-tender, hypoactive bowel sounds, no organomegaly GU:  Contact dermatitis/irritation in the lower inguinal/vaginal area, erythema Extremities: Extremities normal, atraumatic, no cyanosis, no edema,  Homans sign is negative, no sign of DVT, tender bilateral LEs, limited ROM bilateral LEs Pulses: 2+ and symmetric Skin: Skin color, texture, turgor normal,  no rashes or lesions Neurologic: Grossly normal, AO x 3, no focal deficits, CN II - XII intact Psych:  Drowsy but appropriate affect  Lab Results: Results for orders placed during the hospital encounter of 11/19/11 (from the past 24 hour(s))  CBC     Status: Abnormal   Collection Time   11/24/11  7:19 AM      Component Value Range   WBC 15.9 (*) 4.0 - 10.5 K/uL   RBC 2.70 (*) 3.87 - 5.11 MIL/uL   Hemoglobin 8.8 (*) 12.0 - 15.0 g/dL   HCT 16.1 (*) 09.6 - 04.5 %   MCV 93.3  78.0 - 100.0 fL   MCH 32.6  26.0 - 34.0 pg   MCHC 34.9  30.0 - 36.0 g/dL   RDW 40.9 (*) 81.1 - 91.4 %   Platelets 420 (*) 150 - 400 K/uL  COMPREHENSIVE METABOLIC PANEL     Status: Abnormal   Collection Time   11/24/11  7:19 AM      Component Value Range   Sodium 135  135 - 145 mEq/L   Potassium 3.9  3.5 - 5.1 mEq/L   Chloride 102  96 - 112 mEq/L   CO2 25  19 - 32 mEq/L   Glucose, Bld 108 (*) 70 - 99 mg/dL   BUN 10  6 - 23 mg/dL   Creatinine, Ser 7.82  0.50 - 1.10 mg/dL   Calcium 9.0  8.4 - 95.6 mg/dL   Total Protein 6.5  6.0 -  8.3 g/dL   Albumin 3.5  3.5 - 5.2 g/dL   AST 27  0 - 37 U/L   ALT 19  0 - 35 U/L   Alkaline Phosphatase 61  39 - 117 U/L   Total Bilirubin 2.2 (*) 0.3 - 1.2 mg/dL   GFR calc non Af Amer >90  >90 mL/min   GFR calc Af Amer >90  >90 mL/min    Studies/Results: No results found.   Medications: Allergies  Allergen Reactions  . Ketorolac Tromethamine Hives and Itching  . Toradol (Ketorolac Tromethamine) Hives and Itching    Current Facility-Administered Medications  Medication Dose Route Frequency Provider Last Rate Last Dose  . 0.45 % sodium chloride infusion   Intravenous Continuous Keitha Butte, NP 50 mL/hr at 11/23/11 0831 50 mL/hr at 11/23/11 0831  . acetaminophen (TYLENOL) tablet 650 mg  650 mg Oral Once  Keitha Butte, NP      . acidophilus (RISAQUAD) capsule 2 capsule  2 capsule Oral Daily Keitha Butte, NP   2 capsule at 11/24/11 0930  . ALPRAZolam (XANAX) tablet 0.25-0.5 mg  0.25-0.5 mg Oral Q3H PRN Ricki Rodriguez, MD   0.25 mg at 11/23/11 2200  . cefTRIAXone (ROCEPHIN) 1 g in dextrose 5 % 50 mL IVPB  1 g Intravenous Q24H Keitha Butte, NP   1 g at 11/23/11 1139  . cholecalciferol (VITAMIN D) tablet 1,000 Units  1,000 Units Oral Daily Keitha Butte, NP   1,000 Units at 11/24/11 0930  . clindamycin (CLEOCIN) capsule 300 mg  300 mg Oral Q12H Keitha Butte, NP   300 mg at 11/24/11 0930  . cyclobenzaprine (FLEXERIL) tablet 5 mg  5 mg Oral BID PRN Keitha Butte, NP      . diphenhydrAMINE (BENADRYL) capsule 25-50 mg  25-50 mg Oral Q4H PRN Ricki Rodriguez, MD       Or  . diphenhydrAMINE (BENADRYL) injection 12.5-25 mg  12.5-25 mg Intravenous Q4H PRN Ricki Rodriguez, MD   25 mg at 11/24/11 1224  . diphenhydrAMINE (BENADRYL) injection 25 mg  25 mg Intravenous Once Keitha Butte, NP      . diphenhydrAMINE-zinc acetate (BENADRYL) 2-0.1 % cream   Topical Daily PRN Keitha Butte, NP      . enoxaparin (LOVENOX) injection 40 mg  40 mg Subcutaneous Q24H Keitha Butte, NP   40 mg at 11/21/11 2119  . fluconazole (DIFLUCAN) tablet 200 mg  200 mg Oral Daily Keitha Butte, NP   200 mg at 11/24/11 0930  . fluvoxaMINE (LUVOX) tablet 50 mg  50 mg Oral QHS Keitha Butte, NP   50 mg at 11/23/11 2106  . folic acid (FOLVITE) tablet 1 mg  1 mg Oral Daily Ricki Rodriguez, MD   1 mg at 11/24/11 0930  . HYDROmorphone (DILAUDID) injection 2-4 mg  2-4 mg Intravenous Q2H PRN Keitha Butte, NP   4 mg at 11/24/11 1223  . hydroxyurea (HYDREA) capsule 500 mg  500 mg Oral BID Ricki Rodriguez, MD   500 mg at 11/24/11 0929  . hydrOXYzine (ATARAX/VISTARIL) tablet 25 mg  25 mg Oral TID Keitha Butte, NP   25 mg at 11/24/11 0930  . levalbuterol (XOPENEX) nebulizer  solution 0.63 mg  0.63 mg Nebulization BID Keitha Butte, NP   0.63 mg at 11/24/11 0026  . lidocaine (LIDODERM) 5 % 2 patch  2 patch Transdermal Q24H Keitha Butte, NP  2 patch at 11/23/11 1234  . metoprolol succinate (TOPROL-XL) 24 hr tablet 12.5 mg  12.5 mg Oral Daily Keitha Butte, NP      . ondansetron (ZOFRAN) tablet 4 mg  4 mg Oral Q4H PRN Ricki Rodriguez, MD       Or  . ondansetron (ZOFRAN) injection 4 mg  4 mg Intravenous Q4H PRN Ricki Rodriguez, MD   4 mg at 11/24/11 1223  . oxyCODONE (Oxy IR/ROXICODONE) immediate release tablet 10 mg  10 mg Oral Q6H PRN Gwenyth Bender, MD   10 mg at 11/23/11 2200  . pantoprazole (PROTONIX) EC tablet 40 mg  40 mg Oral BID Keitha Butte, NP   40 mg at 11/24/11 0930  . polyethylene glycol (MIRALAX / GLYCOLAX) packet 17 g  17 g Oral Daily Keitha Butte, NP   17 g at 11/23/11 1121  . witch hazel-glycerin (TUCKS) pad   Topical PRN Keitha Butte, NP      . zolpidem (AMBIEN) tablet 5 mg  5 mg Oral QHS PRN,MR X 1 Ricki Rodriguez, MD      . DISCONTD: metoprolol tartrate (LOPRESSOR) tablet 12.5 mg  12.5 mg Oral BID Keitha Butte, NP   12.5 mg at 11/24/11 0930    Assessment/Plan: Patient Active Problem List  Diagnosis  . SICKLE CELL ANEMIA  . TRICHOTILLOMANIA  . Active smoker  . Back pain  . Depression  . GERD (gastroesophageal reflux disease)  . Contraception management  . Stress  . Overweight  . Vaso-occlusive sickle cell crisis  . Right thigh pain  . Vaginosis  . Tachycardia    Sickle Cell Crisis:  The patient is in active hemolysis and a blood exchange with a peripheral IV will be attempted as the patient declines receiving a PICC line.  The patient is receiving pain/nausea/pruritis/bowel management, IV hydration, Folic Acid, Hydroxyurea and GI/DVT prophylaxis.  PT has evaluated and treated the patient for her bilateral LE pain and chronic back pain.  A rolling walker has been ordered for the patient for home  usage per PT recommendations.  Xray of right hip is pending.   CMP and CBC in the am.  Hemoglobinopathy has been received. Trichotillomania/Anxiety/Depression/possible ADHD:  The patient is receiving Luvox 50 mg PO QHS. GERD:  The patient is receiving Protonix BID Leukocytosis (Sickle Lung suspected)/Vaginosis:  The patient's antibiotic therapy has been changed to Rocephin and Clindamycin.  The patient has completed a short course of Diflucan.  The patient has scheduled nebulizer treatments and frequent IS usage is encouraged.  Tachycardia:  The patient's medication has been changed from a low-dose Metoprolol Tartate BID to a low-dose Metoprolol Succinate daily with holding parameters to see if this is effective for the tachycardia.    Discussed and agreed upon plan of care with the patient.   The plan of care will be adjusted based on the patient's clinical progress.  Larina Bras, NP-C 11/24/2011, 12:09 PM

## 2011-11-25 LAB — COMPREHENSIVE METABOLIC PANEL
AST: 32 U/L (ref 0–37)
Albumin: 3.5 g/dL (ref 3.5–5.2)
Alkaline Phosphatase: 70 U/L (ref 39–117)
BUN: 12 mg/dL (ref 6–23)
Chloride: 103 mEq/L (ref 96–112)
Potassium: 3.7 mEq/L (ref 3.5–5.1)
Total Bilirubin: 2.4 mg/dL — ABNORMAL HIGH (ref 0.3–1.2)

## 2011-11-25 LAB — CBC
HCT: 23.5 % — ABNORMAL LOW (ref 36.0–46.0)
Platelets: ADEQUATE 10*3/uL (ref 150–400)
RBC: 2.53 MIL/uL — ABNORMAL LOW (ref 3.87–5.11)
RDW: 17.7 % — ABNORMAL HIGH (ref 11.5–15.5)
WBC: 19.3 10*3/uL — ABNORMAL HIGH (ref 4.0–10.5)

## 2011-11-25 NOTE — Progress Notes (Signed)
11/25/11 2000 Nursing Erie Noe from IV Team notified that patients iv out of date as of midnight. Patient is a difficult stick and will be discharged in am. Iv team in agreement to leave current iv in past midnight

## 2011-11-25 NOTE — Progress Notes (Signed)
[facility] Progress Note    Subjective: The patient was seen on rounds today.  The patient was in the bathroom when I arrived walked out and laid in the bed.  The patient is complaining of pain 7/10 in her bilateral LEs, diffuse back pain and groin area.   Patient stated that her vaginal irritation is improving. Blood exchange was not done. Discussed discharge plan for tomorrow.   No other nursing or patient concerns at this time.   Objective: Vital signs in last 24 hours: Blood pressure 121/58, pulse 114, temperature 98 F (36.7 C), temperature source Oral, resp. rate 20, height 5\' 3"  (1.6 m), weight 82.555 kg (182 lb), last menstrual period 11/04/2011, SpO2 95.00%, not currently breastfeeding.  General Appearance: Drowsy but oriented, cooperative, well nourished, well developed, no apparent distress Head: Normocephalic,  sparse hair on head Eyes: PERRLA, EOM's intact, scleral icterus Nose: Nares, septum and mucosa are normal, no drainage or sinus tenderness Throat: Lips, mucosa, and tongue are normal,  teeth and gums normal Back: Symmetric, no curvature, impaired ROM, bilateral CVA tenderness, diffuse tenderness  Cardio: Regular rate and rhythm, S1, S2 normal, tachycardic at times, no murmur/click/rub/gallop GI: Soft, distended, non-tender, hypoactive bowel sounds, no organomegaly GU:  Contact dermatitis/irritation in the lower inguinal/vaginal area, erythema Extremities: Extremities normal, atraumatic, no cyanosis, no edema, Homans sign is negative, tender bilateral LEs, limited ROM bilateral LEs Pulses: 2+ and symmetric Skin: Skin color, texture, turgor normal,  no rashes or lesions, tattoo lower back  Neurologic: Grossly normal, AO x 3, no focal deficits, CN II - XII intact Psych:  appropriate affect  Lab Results: Results for orders placed during the hospital encounter of 11/19/11 (from the past 24 hour(s))  TYPE AND SCREEN     Status: Normal (Preliminary result)   Collection Time   11/24/11 12:30 PM      Component Value Range   ABO/RH(D) B POS     Antibody Screen POS     Sample Expiration 11/27/2011     Antibody Identification NO CLINICALLY SIGNIFICANT ANTIBODY IDENTIFIED.     DAT, IgG NEG     Unit Number [id number]     Blood Component Type RED CELLS,LR     Unit division 00     Status of Unit ALLOCATED     Transfusion Status OK TO TRANSFUSE     Crossmatch Result COMPATIBLE     Donor AG Type NEGATIVE FOR C ANTIGEN NEGATIVE FOR KELL ANTIGEN    CBC     Status: Abnormal   Collection Time   11/25/11  6:35 AM      Component Value Range   WBC 19.3 (*) 4.0 - 10.5 K/uL   RBC 2.53 (*) 3.87 - 5.11 MIL/uL   Hemoglobin 8.2 (*) 12.0 - 15.0 g/dL   HCT 54.0 (*) 98.1 - 19.1 %   MCV 92.9  78.0 - 100.0 fL   MCH 32.4  26.0 - 34.0 pg   MCHC 34.9  30.0 - 36.0 g/dL   RDW 47.8 (*) 29.5 - 62.1 %   Platelets    150 - 400 K/uL   Value: PLATELET CLUMPS NOTED ON SMEAR, COUNT APPEARS ADEQUATE  COMPREHENSIVE METABOLIC PANEL     Status: Abnormal   Collection Time   11/25/11  6:35 AM      Component Value Range   Sodium 136  135 - 145 mEq/L   Potassium 3.7  3.5 - 5.1 mEq/L   Chloride 103  96 - 112 mEq/L  CO2 27  19 - 32 mEq/L   Glucose, Bld 102 (*) 70 - 99 mg/dL   BUN 12  6 - 23 mg/dL   Creatinine, Ser 1.61  0.50 - 1.10 mg/dL   Calcium 8.8  8.4 - 09.6 mg/dL   Total Protein 6.7  6.0 - 8.3 g/dL   Albumin 3.5  3.5 - 5.2 g/dL   AST 32  0 - 37 U/L   ALT 23  0 - 35 U/L   Alkaline Phosphatase 70  39 - 117 U/L   Total Bilirubin 2.4 (*) 0.3 - 1.2 mg/dL   GFR calc non Af Amer >90  >90 mL/min   GFR calc Af Amer >90  >90 mL/min    Studies/Results: Dg Hip Complete Right  11/24/2011  *RADIOLOGY REPORT*  Clinical Data: Right hip and groin pain.  Sickle cell crisis.  RIGHT HIP - COMPLETE 2+ VIEW  Comparison: CT pelvis 11/08/2011 and right hip series 03/15/2011.  Findings: No acute osseous or joint abnormality.  A sclerotic lesion at the junction of the left femoral head and  neck is unchanged and may represent a bone infarct.  IMPRESSION: No acute findings appear  Original Report Authenticated By: Reyes Ivan, M.D.     Medications: Allergies  Allergen Reactions  . Ketorolac Tromethamine Hives and Itching  . Toradol (Ketorolac Tromethamine) Hives and Itching    Current Facility-Administered Medications  Medication Dose Route Frequency Provider Last Rate Last Dose  . 0.45 % sodium chloride infusion   Intravenous Continuous Keitha Butte, NP 50 mL/hr at 11/23/11 0831 50 mL/hr at 11/23/11 0831  . acetaminophen (TYLENOL) tablet 650 mg  650 mg Oral Once Keitha Butte, NP      . acidophilus (RISAQUAD) capsule 2 capsule  2 capsule Oral Daily Keitha Butte, NP   2 capsule at 11/25/11 1201  . ALPRAZolam Prudy Feeler) tablet 0.25-0.5 mg  0.25-0.5 mg Oral Q3H PRN Ricki Rodriguez, MD   0.5 mg at 11/24/11 2128  . cefTRIAXone (ROCEPHIN) 1 g in dextrose 5 % 50 mL IVPB  1 g Intravenous Q24H Keitha Butte, NP   1 g at 11/23/11 1139  . cholecalciferol (VITAMIN D) tablet 1,000 Units  1,000 Units Oral Daily Keitha Butte, NP   1,000 Units at 11/25/11 1202  . clindamycin (CLEOCIN) capsule 300 mg  300 mg Oral Q12H Keitha Butte, NP   300 mg at 11/25/11 0040  . cyclobenzaprine (FLEXERIL) tablet 5 mg  5 mg Oral BID PRN Keitha Butte, NP      . diphenhydrAMINE (BENADRYL) capsule 25-50 mg  25-50 mg Oral Q4H PRN Ricki Rodriguez, MD       Or  . diphenhydrAMINE (BENADRYL) injection 12.5-25 mg  12.5-25 mg Intravenous Q4H PRN Ricki Rodriguez, MD   25 mg at 11/24/11 1224  . diphenhydrAMINE (BENADRYL) injection 25 mg  25 mg Intravenous Once Keitha Butte, NP      . diphenhydrAMINE-zinc acetate (BENADRYL) 2-0.1 % cream   Topical Daily PRN Keitha Butte, NP      . enoxaparin (LOVENOX) injection 40 mg  40 mg Subcutaneous Q24H Keitha Butte, NP   40 mg at 11/21/11 2119  . fluvoxaMINE (LUVOX) tablet 50 mg  50 mg Oral QHS Keitha Butte,  NP   50 mg at 11/24/11 2127  . folic acid (FOLVITE) tablet 1 mg  1 mg Oral Daily Ricki Rodriguez, MD   1 mg at 11/25/11 1205  .  HYDROmorphone (DILAUDID) injection 2-4 mg  2-4 mg Intravenous Q2H PRN Keitha Butte, NP   4 mg at 11/25/11 0651  . hydroxyurea (HYDREA) capsule 500 mg  500 mg Oral BID Ricki Rodriguez, MD   500 mg at 11/25/11 1202  . hydrOXYzine (ATARAX/VISTARIL) tablet 25 mg  25 mg Oral TID Keitha Butte, NP   25 mg at 11/25/11 1202  . levalbuterol (XOPENEX) nebulizer solution 0.63 mg  0.63 mg Nebulization BID Keitha Butte, NP   0.63 mg at 11/25/11 0816  . lidocaine (LIDODERM) 5 % 2 patch  2 patch Transdermal Q24H Keitha Butte, NP   2 patch at 11/25/11 1201  . metoprolol succinate (TOPROL-XL) 24 hr tablet 12.5 mg  12.5 mg Oral Daily Keitha Butte, NP   12.5 mg at 11/25/11 1201  . ondansetron (ZOFRAN) tablet 4 mg  4 mg Oral Q4H PRN Ricki Rodriguez, MD       Or  . ondansetron (ZOFRAN) injection 4 mg  4 mg Intravenous Q4H PRN Ricki Rodriguez, MD   4 mg at 11/24/11 1223  . oxyCODONE (Oxy IR/ROXICODONE) immediate release tablet 10 mg  10 mg Oral Q6H PRN Gwenyth Bender, MD   10 mg at 11/25/11 0040  . pantoprazole (PROTONIX) EC tablet 40 mg  40 mg Oral BID Keitha Butte, NP   40 mg at 11/25/11 1202  . polyethylene glycol (MIRALAX / GLYCOLAX) packet 17 g  17 g Oral Daily Keitha Butte, NP   17 g at 11/23/11 1121  . witch hazel-glycerin (TUCKS) pad   Topical PRN Keitha Butte, NP      . zolpidem (AMBIEN) tablet 5 mg  5 mg Oral QHS PRN,MR X 1 Ricki Rodriguez, MD   5 mg at 11/25/11 0001    Assessment/Plan: Patient Active Problem List  Diagnosis  . SICKLE CELL ANEMIA  . TRICHOTILLOMANIA  . Active smoker  . Back pain  . Depression  . GERD (gastroesophageal reflux disease)  . Contraception management  . Stress  . Overweight  . Vaso-occlusive sickle cell crisis  . Right thigh pain  . Vaginosis  . Tachycardia    Sickle Cell Crisis:  The patient  is in active hemolysis  The patient is receiving pain/nausea/pruritis/bowel management, IV hydration, Folic Acid, Hydroxyurea and GI/DVT prophylaxis.  PT has evaluated and treated the patient for her bilateral LE pain and chronic back pain.  A rolling walker has been ordered for the patient for home usage per PT recommendations.   CMP and CBC daily  Trichotillomania/Anxiety/Depression/possible ADHD:  Luvox 50 mg PO QHS. GERD:  Protonix BID Leukocytosis (Sickle Lung suspected)/Vaginosis:   Rocephin and Clindamycin.  Completed a short course of Diflucan.  The patient has scheduled nebulizer treatments and frequent IS usage is encouraged.  Tachycardia:  Metoprolol Succinate daily with holding parameters to see if this is effective for the tachycardia.  Gwinda Passe, NP-C 11/25/2011, 12:19 PM

## 2011-11-25 NOTE — Progress Notes (Signed)
PT Cancellation Note  Treatment cancelled today due to patient's refusal to participate.  Pt reported having visitors and asked therapy to check back at 4pm.  Upon checking back with pt, visitors still present and pt with food tray.  Imari Reen,KATHrine E 11/25/2011, 4:07 PM Pager: [phone]

## 2011-11-26 LAB — COMPREHENSIVE METABOLIC PANEL
AST: 34 U/L (ref 0–37)
Albumin: 3.5 g/dL (ref 3.5–5.2)
Calcium: 8.9 mg/dL (ref 8.4–10.5)
Creatinine, Ser: 0.6 mg/dL (ref 0.50–1.10)
GFR calc non Af Amer: >90 mL/min (ref >90)

## 2011-11-26 LAB — CBC
MCH: 32.6 pg (ref 26.0–34.0)
MCHC: 34.2 g/dL (ref 30.0–36.0)
MCV: 95.3 fL (ref 78.0–100.0)
Platelets: 463 10*3/uL — ABNORMAL HIGH (ref 150–400)
RDW: 19.3 % — ABNORMAL HIGH (ref 11.5–15.5)

## 2011-11-26 NOTE — Progress Notes (Signed)
Subjective:  Patient complains of a lower back pain and leg pain grade 7/10 he is denies any urinary complaints denies fever chills denies chest pain or shortness of breath  Objective:  Vital Signs in the last 24 hours: Temp:  [98 F (36.7 C)-98.7 F (37.1 C)] 98.2 F (36.8 C) (07/13 1033) Pulse Rate:  [95-111] 107  (07/13 1033) Resp:  [18] 18  (07/13 1033) BP: (90-118)/(56-79) 90/56 mmHg (07/13 1033) SpO2:  [94 %-98 %] 96 % (07/13 1033)  Intake/Output from previous day: 07/12 0701 - 07/13 0700 In: 1560 [P.O.:960; I.V.:600] Out: -  Intake/Output from this shift: Total I/O In: 480 [P.O.:480] Out: -   Physical Exam: Neck: no adenopathy, no carotid bruit, no JVD and supple, symmetrical, trachea midline Lungs: clear to auscultation bilaterally Heart: regular rate and rhythm, S1, S2 normal, no murmur, click, rub or gallop Abdomen: soft, non-tender; bowel sounds normal; no masses,  no organomegaly Extremities: extremities normal, atraumatic, no cyanosis or edema  Lab Results:  Basename 11/26/11 0855 11/25/11 0635  WBC 11.6* 19.3*  HGB 9.0* 8.2*  PLT 463* PLATELET CLUMPS NOTED ON SMEAR, COUNT APPEARS ADEQUATE    Basename 11/26/11 0855 11/25/11 0635  NA 135 136  K 3.7 3.7  CL 101 103  CO2 25 27  GLUCOSE 116* 102*  BUN 7 12  CREATININE 0.60 0.73   No results found for this basename: TROPONINI:2,CK,MB:2 in the last 72 hours Hepatic Function Panel  Basename 11/26/11 0855  PROT 6.7  ALBUMIN 3.5  AST 34  ALT 30  ALKPHOS 71  BILITOT 2.1*  BILIDIR --  IBILI --   No results found for this basename: CHOL in the last 72 hours No results found for this basename: PROTIME in the last 72 hours  Imaging: Imaging results have been reviewed and Dg Hip Complete Right  11/24/2011  *RADIOLOGY REPORT*  Clinical Data: Right hip and groin pain.  Sickle cell crisis.  RIGHT HIP - COMPLETE 2+ VIEW  Comparison: CT pelvis 11/08/2011 and right hip series 03/15/2011.  Findings: No acute  osseous or joint abnormality.  A sclerotic lesion at the junction of the left femoral head and neck is unchanged and may represent a bone infarct.  IMPRESSION: No acute findings appear  Original Report Authenticated By: Reyes Ivan, M.D.    Cardiac Studies:  Assessment/Plan:  Resolving sickle cell crisis History of tobacco abuse  Anxiety disorder Anemia Plan Continue present management   LOS: 7 days    Zeena Starkel N 11/26/2011, 11:16 AM

## 2011-11-27 LAB — COMPREHENSIVE METABOLIC PANEL
ALT: 44 U/L — ABNORMAL HIGH (ref 0–35)
CO2: 23 mEq/L (ref 19–32)
Calcium: 8.6 mg/dL (ref 8.4–10.5)
GFR calc Af Amer: >90 mL/min (ref >90)
GFR calc non Af Amer: >90 mL/min (ref >90)
Glucose, Bld: 100 mg/dL — ABNORMAL HIGH (ref 70–99)
Sodium: 138 mEq/L (ref 135–145)
Total Bilirubin: 2.2 mg/dL — ABNORMAL HIGH (ref 0.3–1.2)

## 2011-11-27 LAB — CBC
Hemoglobin: 8.3 g/dL — ABNORMAL LOW (ref 12.0–15.0)
MCH: 32.8 pg (ref 26.0–34.0)
MCV: 94.9 fL (ref 78.0–100.0)
RBC: 2.53 MIL/uL — ABNORMAL LOW (ref 3.87–5.11)

## 2011-11-27 LAB — CULTURE, BLOOD (ROUTINE X 2): Culture: NO GROWTH

## 2011-11-27 MED ORDER — ALPRAZOLAM 0.25 MG PO TABS: 0.2500 mg | ORAL_TABLET | ORAL | Status: DC | PRN

## 2011-11-27 MED ORDER — METOPROLOL SUCCINATE 12.5 MG HALF TABLET: 12.5000 mg | ORAL_TABLET | Freq: Every day | ORAL | Status: DC

## 2011-11-27 MED ORDER — OXYCODONE HCL 10 MG PO TABS: 10.0000 mg | ORAL_TABLET | Freq: Four times a day (QID) | ORAL | Status: DC | PRN

## 2011-11-27 MED ORDER — CLINDAMYCIN HCL 300 MG PO CAPS: 300.0000 mg | ORAL_CAPSULE | Freq: Two times a day (BID) | ORAL | Status: AC

## 2011-11-27 MED ORDER — FLUVOXAMINE MALEATE 50 MG PO TABS: 50.0000 mg | ORAL_TABLET | Freq: Every day | ORAL | Status: AC

## 2011-11-27 MED ORDER — HYDROMORPHONE HCL PF 2 MG/ML IJ SOLN
2.0000 mg | Freq: Once | INTRAMUSCULAR | Status: AC
  Administered 2011-11-27: 2 mg via INTRAVENOUS

## 2011-11-27 MED ORDER — LEVALBUTEROL HCL 0.63 MG/3ML IN NEBU
0.6300 mg | INHALATION_SOLUTION | Freq: Three times a day (TID) | RESPIRATORY_TRACT | Status: DC | PRN
  Filled 2011-11-27: qty 3

## 2011-11-27 MED ORDER — OXYCODONE HCL 10 MG PO TB12: 20.0000 mg | ORAL_TABLET | Freq: Two times a day (BID) | ORAL | Status: DC

## 2011-11-27 MED ORDER — CYCLOBENZAPRINE HCL 5 MG PO TABS: 5.0000 mg | ORAL_TABLET | Freq: Two times a day (BID) | ORAL | Status: DC | PRN

## 2011-11-27 NOTE — Progress Notes (Signed)
Patient stays up all night arguing with boyfriend and setting her cell phone to alarm in order for her ask for pain meds every two hours. She often sleeps through the alarm. Patient and boyfriend were talked to in reference to the arguing and loud voices coming from the room at night. Neighboring patients complained. Patient has been given po pain medication which she has been reluctant to take. She has oxy IR 10mg  every 6 hours. She may benefit for something more frequently or higher dosed by mouth. Patient was informed that rest was  Imperative and that we did not want to ask her guest to leave in order to better take care of her. They agreed to keep it down.

## 2011-11-27 NOTE — Progress Notes (Signed)
Pt. Has just informed me that she "feels too bad to go home". Pt. States she feels that she wil be ready to go home at about 0900 tomorrow.  I notify Marcelino Duster, Dr. Diamantina Providence P.A. At this time, and she states that pt. May spend the night.  She further states that pt. May be discharged in the morning with this current paperwork, in that her discharge is compete from the doctor's perspective.  Pt. Is pleased with this news.

## 2011-11-27 NOTE — Discharge Summary (Signed)
[facility] Discharge Summary   Patient ID: [name] MRN: [id number] DOB/AGE: 01/15/1991 21 y.o.  Admit date: 11/19/2011 Discharge date: 11/27/2011  Primary Care Physician:  Dr. Willey Blade   Admission Diagnoses:  Active Problems: Sickle cell crisis  Vaginosis  Tachycardia   Discharge Diagnoses:   Sickle cell crisis Vaginosis Depression  Tachycardia     Discharge Medications:  Medication List  As of 11/27/2011  4:15 PM   TAKE these medications         ALPRAZolam 0.25 MG tablet   Commonly known as: XANAX   Take 1-2 tablets (0.25-0.5 mg total) by mouth every 3 (three) hours as needed for anxiety.      cholecalciferol 1000 UNITS tablet   Commonly known as: VITAMIN D   Take 1,000 Units by mouth daily.      clindamycin 300 MG capsule   Commonly known as: CLEOCIN   Take 1 capsule (300 mg total) by mouth every 12 (twelve) hours.      cyclobenzaprine 5 MG tablet   Commonly known as: FLEXERIL   Take 1 tablet (5 mg total) by mouth 2 (two) times daily as needed. Patient used this medication for her muscle spasms.      fluvoxaMINE 50 MG tablet   Commonly known as: LUVOX   Take 1 tablet (50 mg total) by mouth at bedtime.      hydroxyurea 500 MG capsule   Commonly known as: HYDREA   Take 1,000 mg by mouth daily. May take with food to minimize GI side effects.      ibuprofen 200 MG tablet   Commonly known as: ADVIL,MOTRIN   Take 600 mg by mouth daily. Patient used this medication for her pain.      Oxycodone HCl 10 MG Tabs   Take 1 tablet (10 mg total) by mouth 4 (four) times daily as needed. For pain.      oxyCODONE 10 MG 12 hr tablet   Commonly known as: OXYCONTIN   Take 2 tablets (20 mg total) by mouth every 12 (twelve) hours.      pantoprazole 40 MG tablet   Commonly known as: PROTONIX   Take 40 mg by mouth daily.             Consults:  None   Significant Diagnostic Studies:  Dg Chest 2 View  11/21/2011  *RADIOLOGY REPORT*  Clinical Data:  21 year old female with sickle cell disease.  Right lower extremity pain.  CHEST - 2 VIEW  Comparison: 10/06/2011 and earlier.  Findings: Stable lung volumes.  Cardiac size and mediastinal contours are within normal limits.  Visualized tracheal air column is within normal limits.  No pneumothorax, pulmonary edema, pleural effusion or acute pulmonary opacity.  Interval decreased vascularity.  Stable mild right suprahilar scarring.  Bony changes of sickle cell disease throughout the visible spine.  IMPRESSION: No acute cardiopulmonary abnormality.  Stable sequelae of sickle cell disease.  Original Report Authenticated By: Harley Hallmark, M.D.   Dg Hip Complete Right  11/24/2011  *RADIOLOGY REPORT*  Clinical Data: Right hip and groin pain.  Sickle cell crisis.  RIGHT HIP - COMPLETE 2+ VIEW  Comparison: CT pelvis 11/08/2011 and right hip series 03/15/2011.  Findings: No acute osseous or joint abnormality.  A sclerotic lesion at the junction of the left femoral head and neck is unchanged and may represent a bone infarct.  IMPRESSION: No acute findings appear  Original Report Authenticated By: Reyes Ivan, M.D.  Ct Femur Right Wo Contrast  11/08/2011  *RADIOLOGY REPORT*  Clinical Data: Sickle cell crisis.  Severe right thigh pain.  CT OF THE RIGHT FEMUR WITHOUT CONTRAST  Comparison: 06/21/2011.  Findings: Vague sclerosis is present in the distal femoral metaphysis.  In a patient with history of sickle cell anemia, this could represent small area of infarction.  There is no AVN.  Mild subchondral sclerosis of the right acetabular roof could be associated with old bone infarct as well.  Soft tissues appear normal and symmetric.  There is no soft tissue fluid collection or inflammatory change.  Muscular compartments appear within normal limits.  No distortion of fat planes.  The right thigh appears symmetric when compared to the left thigh. There is no inguinal adenopathy.  The visualized visceral pelvis appears  within normal limits.  IMPRESSION:  1.  Normal soft tissues of the right thigh for noncontrast CT. 2.  Small areas of sclerosis in the right acetabular roof and distal femoral metaphysis may represent small bone infarcts in a patient with history of sickle cell anemia.  Original Report Authenticated By: Andreas Newport, M.D.     [facility] Course:  For complete details please refer to admission H and P, but in brief, [name]  Presented to the ED with similar sickle cell crisis pain noted elevated retctic count and leukocytosis    BP 110/73  Pulse 95  Temp 98.2 F (36.8 C) (Oral)  Resp 18  Ht 5\' 3"  (1.6 m)  Wt 82.555 kg (182 lb)  BMI 32.24 kg/m2  SpO2 93%  LMP 11/04/2011  Breastfeeding? No General Appearance: Drowsy but oriented, cooperative, well nourished, well developed, no apparent distress  Back: Symmetric, no curvature, impaired ROM, bilateral CVA tenderness, diffuse tenderness  Cardio: Regular rate and rhythm, S1, S2 normal, tachycardic at times, no murmur/click/rub/gallop  GI: Soft, distended, non-tender, hypoactive bowel sounds, no organomegaly  GU: Contact dermatitis/irritation in the lower inguinal/vaginal area, erythema  Extremities: Extremities normal, atraumatic, no cyanosis, no edema, Homans sign is negative, tender bilateral LEs, limited ROM bilateral LEs  Pulses: 2+ and symmetric   Disposition at Discharge: 01-Home or Self Care  Discharge Orders: See Discharge instructions   Condition at Discharge:   Stable  Time spent on Discharge:  Greater than 30 minutes.  SignedGrayce Sessions 11/27/2011, 4:15 PM

## 2011-11-27 NOTE — Progress Notes (Signed)
Subjective:  Patient complains of back and leg pain grade 10 over 10. Denies any chest pain or shortness of breath denies fever chills denies urinary complaints  Objective:  Vital Signs in the last 24 hours: Temp:  [97.8 F (36.6 C)-98.5 F (36.9 C)] 97.8 F (36.6 C) (07/14 0657) Pulse Rate:  [92-100] 96  (07/14 0657) Resp:  [16-18] 18  (07/14 0657) BP: (102-109)/(48-68) 109/48 mmHg (07/14 0657) SpO2:  [94 %-100 %] 98 % (07/14 0657)  Intake/Output from previous day: 07/13 0701 - 07/14 0700 In: 1920 [P.O.:1920] Out: -  Intake/Output from this shift: Total I/O In: 480 [P.O.:480] Out: -   Physical Exam: Neck: no adenopathy, no carotid bruit, no JVD and supple, symmetrical, trachea midline Lungs: clear to auscultation bilaterally Heart: regular rate and rhythm, S1, S2 normal, no murmur, click, rub or gallop Abdomen: soft, non-tender; bowel sounds normal; no masses,  no organomegaly Extremities: extremities normal, atraumatic, no cyanosis or edema  Lab Results:  [facility] 11/27/11 0610 11/26/11 0855  WBC 14.3* 11.6*  HGB 8.3* 9.0*  PLT 364 463*    Basename 11/27/11 0610 11/26/11 0855  NA 138 135  K 3.9 3.7  CL 106 101  CO2 23 25  GLUCOSE 100* 116*  BUN 9 7  CREATININE 0.62 0.60   No results found for this basename: TROPONINI:2,CK,MB:2 in the last 72 hours Hepatic Function Panel  Basename 11/27/11 0610  PROT 6.3  ALBUMIN 3.3*  AST 43*  ALT 44*  ALKPHOS 78  BILITOT 2.2*  BILIDIR --  IBILI --   No results found for this basename: CHOL in the last 72 hours No results found for this basename: PROTIME in the last 72 hours  Imaging: Imaging results have been reviewed and No results found.  Cardiac Studies:  Assessment/Plan:   sickle cell crisis  History of tobacco abuse  Anxiety disorder  Anemia  Plan Continue present management Dilaudid 2 mg IV now for breakthrough pain  LOS: 8 days    [name] N 11/27/2011, 11:39 AM

## 2011-11-28 ENCOUNTER — Non-Acute Institutional Stay (HOSPITAL_COMMUNITY)
Admission: AD | Admit: 2011-11-28 | Discharge: 2011-11-29 | Disposition: A | Discharge status: Home / Self Care | Payer: Medicaid Other | Source: Ambulatory Visit | Attending: Internal Medicine | Admitting: Internal Medicine

## 2011-11-28 DIAGNOSIS — D5700 Hb-SS disease with crisis, unspecified: Secondary | ICD-10-CM | POA: Insufficient documentation

## 2011-11-28 DIAGNOSIS — R0609 Other forms of dyspnea: Secondary | ICD-10-CM | POA: Insufficient documentation

## 2011-11-28 DIAGNOSIS — J984 Other disorders of lung: Secondary | ICD-10-CM | POA: Insufficient documentation

## 2011-11-28 DIAGNOSIS — D72829 Elevated white blood cell count, unspecified: Secondary | ICD-10-CM | POA: Insufficient documentation

## 2011-11-28 DIAGNOSIS — G894 Chronic pain syndrome: Secondary | ICD-10-CM | POA: Insufficient documentation

## 2011-11-28 DIAGNOSIS — K219 Gastro-esophageal reflux disease without esophagitis: Secondary | ICD-10-CM | POA: Insufficient documentation

## 2011-11-28 DIAGNOSIS — M25559 Pain in unspecified hip: Secondary | ICD-10-CM | POA: Insufficient documentation

## 2011-11-28 DIAGNOSIS — R0989 Other specified symptoms and signs involving the circulatory and respiratory systems: Secondary | ICD-10-CM | POA: Insufficient documentation

## 2011-11-28 DIAGNOSIS — F411 Generalized anxiety disorder: Secondary | ICD-10-CM | POA: Insufficient documentation

## 2011-11-28 DIAGNOSIS — N760 Acute vaginitis: Secondary | ICD-10-CM | POA: Insufficient documentation

## 2011-11-28 DIAGNOSIS — Z79899 Other long term (current) drug therapy: Secondary | ICD-10-CM | POA: Insufficient documentation

## 2011-11-28 DIAGNOSIS — R109 Unspecified abdominal pain: Secondary | ICD-10-CM | POA: Insufficient documentation

## 2011-11-28 DIAGNOSIS — R000 Tachycardia, unspecified: Secondary | ICD-10-CM | POA: Insufficient documentation

## 2011-11-28 DIAGNOSIS — M79609 Pain in unspecified limb: Secondary | ICD-10-CM | POA: Insufficient documentation

## 2011-11-28 DIAGNOSIS — M948X9 Other specified disorders of cartilage, unspecified sites: Secondary | ICD-10-CM | POA: Insufficient documentation

## 2011-11-28 LAB — COMPREHENSIVE METABOLIC PANEL
ALT: 44 U/L — ABNORMAL HIGH (ref 0–35)
AST: 50 U/L — ABNORMAL HIGH (ref 0–37)
Albumin: 3.2 g/dL — ABNORMAL LOW (ref 3.5–5.2)
Alkaline Phosphatase: 69 U/L (ref 39–117)
Glucose, Bld: 110 mg/dL — ABNORMAL HIGH (ref 70–99)
Potassium: 4 mEq/L (ref 3.5–5.1)
Sodium: 138 mEq/L (ref 135–145)
Total Protein: 6.4 g/dL (ref 6.0–8.3)

## 2011-11-28 LAB — TYPE AND SCREEN
ABO/RH(D): B POS
DAT, IgG: NEGATIVE
Donor AG Type: NEGATIVE
Unit division: 0

## 2011-11-28 LAB — CBC
Hemoglobin: 8.8 g/dL — ABNORMAL LOW (ref 12.0–15.0)
MCHC: 34.5 g/dL (ref 30.0–36.0)
Platelets: 411 10*3/uL — ABNORMAL HIGH (ref 150–400)
RDW: 19.9 % — ABNORMAL HIGH (ref 11.5–15.5)

## 2011-11-28 LAB — VITAMIN D 1,25 DIHYDROXY: Vitamin D2 1, 25 (OH)2: <8 pg/mL

## 2011-11-28 MED ORDER — DEXTROSE-NACL 5-0.45 % IV SOLN
INTRAVENOUS | Status: DC
  Administered 2011-11-28: 22:00:00 via INTRAVENOUS
  Administered 2011-11-29: 100 mL/h via INTRAVENOUS

## 2011-11-28 MED ORDER — CLINDAMYCIN HCL 300 MG PO CAPS
300.0000 mg | ORAL_CAPSULE | Freq: Two times a day (BID) | ORAL | Status: DC
  Administered 2011-11-28 (×2): 300 mg via ORAL
  Filled 2011-11-28 (×5): qty 1

## 2011-11-28 MED ORDER — DIPHENHYDRAMINE HCL 50 MG/ML IJ SOLN
12.5000 mg | INTRAMUSCULAR | Status: DC | PRN
  Administered 2011-11-29 (×2): 25 mg via INTRAVENOUS
  Filled 2011-11-28 (×3): qty 1

## 2011-11-28 MED ORDER — HYDROMORPHONE HCL 2 MG PO TABS: 2.0000 mg | ORAL_TABLET | ORAL | Status: DC | PRN

## 2011-11-28 MED ORDER — HYDROMORPHONE HCL PF 2 MG/ML IJ SOLN
2.0000 mg | INTRAMUSCULAR | Status: DC | PRN
  Administered 2011-11-28 – 2011-11-29 (×6): 2 mg via INTRAVENOUS
  Filled 2011-11-28 (×6): qty 1

## 2011-11-28 MED ORDER — HYDROMORPHONE HCL PF 2 MG/ML IJ SOLN
2.0000 mg | INTRAMUSCULAR | Status: DC | PRN
  Administered 2011-11-28 (×3): 2 mg via INTRAMUSCULAR
  Filled 2011-11-28 (×3): qty 1

## 2011-11-28 MED ORDER — ONDANSETRON HCL 4 MG/2ML IJ SOLN
4.0000 mg | INTRAMUSCULAR | Status: DC | PRN
  Administered 2011-11-28 – 2011-11-29 (×3): 4 mg via INTRAVENOUS
  Filled 2011-11-28 (×3): qty 2

## 2011-11-28 MED ORDER — DEXTROSE-NACL 5-0.45 % IV SOLN: INTRAVENOUS | Status: DC

## 2011-11-28 MED ORDER — FOLIC ACID 1 MG PO TABS: 1.0000 mg | ORAL_TABLET | Freq: Every day | ORAL | Status: DC

## 2011-11-28 MED ORDER — DIPHENHYDRAMINE HCL 25 MG PO CAPS
25.0000 mg | ORAL_CAPSULE | ORAL | Status: DC | PRN
  Administered 2011-11-28: 25 mg via ORAL
  Filled 2011-11-28 (×2): qty 1

## 2011-11-28 MED ORDER — HYDROMORPHONE HCL PF 2 MG/ML IJ SOLN: 2.0000 mg | INTRAMUSCULAR | Status: DC | PRN

## 2011-11-28 MED ORDER — ONDANSETRON HCL 4 MG PO TABS
4.0000 mg | ORAL_TABLET | ORAL | Status: DC | PRN
  Administered 2011-11-28: 4 mg via ORAL
  Filled 2011-11-28 (×2): qty 1

## 2011-11-28 NOTE — Progress Notes (Signed)
Patient has settled down tremendously and has actually been asleep for the most of the night. She has called a few times for pain medication, but was sound asleep when I arrived with medication each time.

## 2011-11-28 NOTE — Progress Notes (Signed)
The nursing supervisor received a called due to the patient's visitors and noise level. There were two children and three adults in the patient's room. The patient's door was closed however the children were crying and could be heard at the nurses station. The supervisor spoke to the patient about the noise. Her visitors left shortly thereafter.

## 2011-11-28 NOTE — Progress Notes (Signed)
Patient is discharged from our end. Patient to be transferred to the sickle cell center. Nurses will come to transport over

## 2011-11-28 NOTE — H&P (Signed)
Patient ID: [name], female   DOB: 1990-11-16, 21 y.o.   MRN: [id number]  Chief Complaint  Patient presents with  . Sickle Cell Pain Crisis    HPI [name] is a 21 y.o. female.  Patient is being admitted from the hospital for transitional care. She has been hospitalized for sickle cell crisis which was gradually improving. She states she still has right flank pain which she rates as a 7/10. This travels down her leg at times as well. She denies chest pains or shortness of breath. Patient had recent night of emotional stressors with some disagreements with staff and possibly boyfriend. She is doing better today however. She has been seen by the clinical social worker for counseling. She believe she'll be able to manage her pain at home by tomorrow.   Past Medical History  Diagnosis Date  . H/O: 1 miscarriage 03/22/2011  . 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 Depression 01/06/2011  . GERD (gastroesophageal reflux disease) 02/17/2011  . Trichotillomania     h/o  . Blood transfusion     "lots"  . Sickle cell anemia with crisis   . Exertional dyspnea     "sometimes"  . Sickle cell anemia   . Headache   . Migraines 11/08/11    "@ least twice/month"  . Chronic back pain     "very severe; have knot in my back; from tight muscle; take RX and exercise for it"  . Mood swings 11/08/11    "I go back and forth; real bad"    Past Surgical History  Procedure Date  . Cholecystectomy 05/2010  . Dilation and curettage of uterus 02/20/11    S/P miscarriage    No family history on file.  Social History History  Substance Use Topics  . Smoking status: Never Smoker   . Smokeless tobacco: Never Used  . Alcohol Use: No    Allergies  Allergen Reactions  . Ketorolac Tromethamine Hives and Itching  . Toradol (Ketorolac Tromethamine) Hives and Itching    Current Facility-Administered Medications  Medication Dose Route Frequency Provider Last Rate Last Dose  . clindamycin (CLEOCIN)  capsule 300 mg  300 mg Oral BID Grayce Sessions, NP   300 mg at 11/28/11 1531  . diphenhydrAMINE (BENADRYL) capsule 25-50 mg  25-50 mg Oral Q4H PRN Gwenyth Bender, MD   25 mg at 11/28/11 1651   Or  . diphenhydrAMINE (BENADRYL) injection 12.5-25 mg  12.5-25 mg Intravenous Q4H PRN Gwenyth Bender, MD      . folic acid (FOLVITE) tablet 1 mg  1 mg Oral Daily Gwenyth Bender, MD      . HYDROmorphone (DILAUDID) injection 2 mg  2 mg Intramuscular Q2H PRN Gwenyth Bender, MD   2 mg at 11/28/11 1651  . HYDROmorphone (DILAUDID) tablet 2 mg  2 mg Oral Q2H PRN Gwenyth Bender, MD      . ondansetron [facility]) tablet 4 mg  4 mg Oral Q4H PRN Gwenyth Bender, MD   4 mg at 11/28/11 1651   Or  . ondansetron (ZOFRAN) injection 4 mg  4 mg Intravenous Q4H PRN Gwenyth Bender, MD      . DISCONTD: dextrose 5 %-0.45 % sodium chloride infusion   Intravenous Continuous Gwenyth Bender, MD      . DISCONTD: HYDROmorphone (DILAUDID) injection 2-4 mg  2-4 mg Intravenous Q2H PRN Gwenyth Bender, MD       Facility-Administered Medications Ordered in Other Encounters  Medication Dose Route Frequency Provider Last Rate Last Dose  . DISCONTD: 0.45 % sodium chloride infusion   Intravenous Continuous Keitha Butte, NP 50 mL/hr at 11/27/11 1857    . DISCONTD: acetaminophen (TYLENOL) tablet 650 mg  650 mg Oral Once Keitha Butte, NP      . DISCONTD: acidophilus (RISAQUAD) capsule 2 capsule  2 capsule Oral Daily Keitha Butte, NP   2 capsule at 11/28/11 0924  . DISCONTD: ALPRAZolam Prudy Feeler) tablet 0.25-0.5 mg  0.25-0.5 mg Oral Q3H PRN Ricki Rodriguez, MD   0.25 mg at 11/25/11 2200  . DISCONTD: cefTRIAXone (ROCEPHIN) 1 g in dextrose 5 % 50 mL IVPB  1 g Intravenous Q24H Keitha Butte, NP   1 g at 11/27/11 1144  . DISCONTD: cholecalciferol (VITAMIN D) tablet 1,000 Units  1,000 Units Oral Daily Keitha Butte, NP   1,000 Units at 11/28/11 0924  . DISCONTD: clindamycin (CLEOCIN) capsule 300 mg  300 mg Oral Q12H Keitha Butte, NP   300 mg  at 11/27/11 1342  . DISCONTD: cyclobenzaprine (FLEXERIL) tablet 5 mg  5 mg Oral BID PRN Keitha Butte, NP   5 mg at 11/27/11 1643  . DISCONTD: diphenhydrAMINE (BENADRYL) capsule 25-50 mg  25-50 mg Oral Q4H PRN Ricki Rodriguez, MD   25 mg at 11/28/11 0924  . DISCONTD: diphenhydrAMINE (BENADRYL) injection 12.5-25 mg  12.5-25 mg Intravenous Q4H PRN Ricki Rodriguez, MD   25 mg at 11/24/11 1224  . DISCONTD: diphenhydrAMINE (BENADRYL) injection 25 mg  25 mg Intravenous Once Keitha Butte, NP      . DISCONTD: diphenhydrAMINE-zinc acetate (BENADRYL) 2-0.1 % cream   Topical Daily PRN Keitha Butte, NP      . DISCONTD: enoxaparin (LOVENOX) injection 40 mg  40 mg Subcutaneous Q24H Keitha Butte, NP   40 mg at 11/26/11 2112  . DISCONTD: fluvoxaMINE (LUVOX) tablet 50 mg  50 mg Oral QHS Keitha Butte, NP   50 mg at 11/27/11 2207  . DISCONTD: folic acid (FOLVITE) tablet 1 mg  1 mg Oral Daily Ricki Rodriguez, MD   1 mg at 11/28/11 0927  . DISCONTD: HYDROmorphone (DILAUDID) injection 2-4 mg  2-4 mg Intravenous Q2H PRN Keitha Butte, NP   3 mg at 11/28/11 0923  . DISCONTD: hydroxyurea (HYDREA) capsule 500 mg  500 mg Oral BID Ricki Rodriguez, MD   500 mg at 11/28/11 0924  . DISCONTD: hydrOXYzine (ATARAX/VISTARIL) tablet 25 mg  25 mg Oral TID Keitha Butte, NP   25 mg at 11/28/11 0924  . DISCONTD: levalbuterol (XOPENEX) nebulizer solution 0.63 mg  0.63 mg Nebulization BID Keitha Butte, NP   0.63 mg at 11/25/11 0816  . DISCONTD: levalbuterol (XOPENEX) nebulizer solution 0.63 mg  0.63 mg Nebulization Q8H PRN Gwenyth Bender, MD      . DISCONTD: lidocaine (LIDODERM) 5 % 2 patch  2 patch Transdermal Q24H Keitha Butte, NP   2 patch at 11/27/11 1342  . DISCONTD: metoprolol succinate (TOPROL-XL) 24 hr tablet 12.5 mg  12.5 mg Oral Daily Keitha Butte, NP   12.5 mg at 11/28/11 0924  . DISCONTD: ondansetron (ZOFRAN) injection 4 mg  4 mg Intravenous Q4H PRN Ricki Rodriguez, MD    4 mg at 11/24/11 1223  . DISCONTD: ondansetron (ZOFRAN) tablet 4 mg  4 mg Oral Q4H PRN Ricki Rodriguez, MD      . DISCONTD: oxyCODONE (Oxy IR/ROXICODONE)  immediate release tablet 10 mg  10 mg Oral Q6H PRN Gwenyth Bender, MD   10 mg at 11/28/11 0756  . DISCONTD: pantoprazole (PROTONIX) EC tablet 40 mg  40 mg Oral BID Keitha Butte, NP   40 mg at 11/28/11 0756  . DISCONTD: polyethylene glycol (MIRALAX / GLYCOLAX) packet 17 g  17 g Oral Daily Keitha Butte, NP   17 g at 11/28/11 0924  . DISCONTD: witch hazel-glycerin (TUCKS) pad   Topical PRN Keitha Butte, NP      . DISCONTD: zolpidem (AMBIEN) tablet 5 mg  5 mg Oral QHS PRN,MR X 1 Ricki Rodriguez, MD   5 mg at 11/25/11 0001    Review of Systems As noted above.   Blood pressure 111/66, pulse 92, temperature 98.2 F (36.8 C), temperature source Oral, resp. rate 16, last menstrual period 11/04/2011, SpO2 97.00%.  Physical Exam Well-developed overweight black female in mild distress. HEENT: No sinus tenderness. No sclera icterus. TMs are clear. NECK: No enlarged thyroid. No posterior cervical nodes. LUNGS: Clear to auscultation. Minimal right CVA tenderness. No vocal fremitus. AV:WUJWJX S1, S2 without S3. No murmurs or rubs. ABDOMEN: No masses or tenderness. MSK: Right flank tenderness is noted. No knee tenderness. Negative Homans. NEURO: Intact. PSYCHIATRIC: Anxious with mild depression. Expressed desire to want to do better.  Data Reviewed  Results for orders placed during the hospital encounter of 11/19/11 (from the past 48 hour(s))  CBC     Status: Abnormal   Collection Time   11/27/11  6:10 AM      Component Value Range Comment   WBC 14.3 (*) 4.0 - 10.5 K/uL    RBC 2.53 (*) 3.87 - 5.11 MIL/uL    Hemoglobin 8.3 (*) 12.0 - 15.0 g/dL    HCT 91.4 (*) 78.2 - 46.0 %    MCV 94.9  78.0 - 100.0 fL    MCH 32.8  26.0 - 34.0 pg    MCHC 34.6  30.0 - 36.0 g/dL    RDW 95.6 (*) 21.3 - 15.5 %    Platelets 364  150 - 400 K/uL     COMPREHENSIVE METABOLIC PANEL     Status: Abnormal   Collection Time   11/27/11  6:10 AM      Component Value Range Comment   Sodium 138  135 - 145 mEq/L    Potassium 3.9  3.5 - 5.1 mEq/L    Chloride 106  96 - 112 mEq/L    CO2 23  19 - 32 mEq/L    Glucose, Bld 100 (*) 70 - 99 mg/dL    BUN 9  6 - 23 mg/dL    Creatinine, Ser 0.86  0.50 - 1.10 mg/dL    Calcium 8.6  8.4 - 57.8 mg/dL    Total Protein 6.3  6.0 - 8.3 g/dL    Albumin 3.3 (*) 3.5 - 5.2 g/dL    AST 43 (*) 0 - 37 U/L    ALT 44 (*) 0 - 35 U/L    Alkaline Phosphatase 78  39 - 117 U/L    Total Bilirubin 2.2 (*) 0.3 - 1.2 mg/dL    GFR calc non Af Amer >90  >90 mL/min    GFR calc Af Amer >90  >90 mL/min   CBC     Status: Abnormal   Collection Time   11/28/11  6:53 AM      Component Value Range Comment   WBC 12.9 (*) 4.0 -  10.5 K/uL    RBC 2.66 (*) 3.87 - 5.11 MIL/uL    Hemoglobin 8.8 (*) 12.0 - 15.0 g/dL    HCT 16.1 (*) 09.6 - 46.0 %    MCV 95.9  78.0 - 100.0 fL    MCH 33.1  26.0 - 34.0 pg    MCHC 34.5  30.0 - 36.0 g/dL    RDW 04.5 (*) 40.9 - 15.5 %    Platelets 411 (*) 150 - 400 K/uL   COMPREHENSIVE METABOLIC PANEL     Status: Abnormal   Collection Time   11/28/11  6:53 AM      Component Value Range Comment   Sodium 138  135 - 145 mEq/L    Potassium 4.0  3.5 - 5.1 mEq/L    Chloride 104  96 - 112 mEq/L    CO2 26  19 - 32 mEq/L    Glucose, Bld 110 (*) 70 - 99 mg/dL    BUN 10  6 - 23 mg/dL    Creatinine, Ser 8.11  0.50 - 1.10 mg/dL    Calcium 9.0  8.4 - 91.4 mg/dL    Total Protein 6.4  6.0 - 8.3 g/dL    Albumin 3.2 (*) 3.5 - 5.2 g/dL    AST 50 (*) 0 - 37 U/L    ALT 44 (*) 0 - 35 U/L    Alkaline Phosphatase 69  39 - 117 U/L    Total Bilirubin 1.8 (*) 0.3 - 1.2 mg/dL    GFR calc non Af Amer >90  >90 mL/min    GFR calc Af Amer >90  >90 mL/min      Assessment    Resolving sickle cell crisis. Musculoskeletal pain. 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 Anxiety disorder. Chronic pain syndrome. Leukocytosis  slowly resolving.    Plan    Oral Medication with breakthrough pain IV medicine as needed. Transition to home by tomorrow. Continue all medications.       Saintclair Schroader 11/28/2011, 6:16 PM

## 2011-11-28 NOTE — Progress Notes (Addendum)
I reported to the patient's room to stop her IV from alarming and the patient immediately demanded to have another nurse. She stated that her doctor told her that I had called the physician at 0300 to report that she was disturbing other patients and setting her cell phone to alarm for pain medication. The patient had company at the time who somewhat instigated the confrontation. I explained that I did not call her doctor, but I did document that I spoke with her and her boyfriend about the constant arguing and her setting her cell phone to alarm in order for her wake and ask for medication. Nursing supervisor Hilarie Fredrickson was made aware and he came to speak with the patient.

## 2011-11-28 NOTE — Progress Notes (Signed)
I reported to the patient's room to administer medications and was accompanied by another RN. The patient was very apologetic about the previous conversation in reference to her believing that I called her doctor and reported negative things about her. I accepted the patients apology and in turn apologized if I had offended her. I again explained that I only documented that I had discussed the arguing that could be heard by staff and other patients in addition to her purposely waking up via cell alarm to ask for pain medication. I discussed her plan of care and pain management and explained that her medication order is as needed and if she is asleep that is okay. If she wakes and needs medication can have it. The goal is not to schedule a prn medication by requesting it every two hours simply because that's how it's ordered. She verbalized understanding.

## 2011-11-29 ENCOUNTER — Emergency Department (HOSPITAL_COMMUNITY): Payer: Medicaid Other

## 2011-11-29 ENCOUNTER — Encounter (HOSPITAL_COMMUNITY): Payer: Self-pay | Admitting: *Deleted

## 2011-11-29 ENCOUNTER — Inpatient Hospital Stay (HOSPITAL_COMMUNITY)
Admission: EM | Admit: 2011-11-29 | Discharge: 2011-12-03 | DRG: 812 | Disposition: A | Discharge status: Home / Self Care | Payer: Medicaid Other | Attending: Internal Medicine | Admitting: Internal Medicine

## 2011-11-29 DIAGNOSIS — Z9089 Acquired absence of other organs: Secondary | ICD-10-CM

## 2011-11-29 DIAGNOSIS — Z888 Allergy status to other drugs, medicaments and biological substances status: Secondary | ICD-10-CM

## 2011-11-29 DIAGNOSIS — F17200 Nicotine dependence, unspecified, uncomplicated: Secondary | ICD-10-CM | POA: Diagnosis present

## 2011-11-29 DIAGNOSIS — F39 Unspecified mood [affective] disorder: Secondary | ICD-10-CM | POA: Diagnosis present

## 2011-11-29 DIAGNOSIS — D5700 Hb-SS disease with crisis, unspecified: Principal | ICD-10-CM | POA: Diagnosis present

## 2011-11-29 DIAGNOSIS — K219 Gastro-esophageal reflux disease without esophagitis: Secondary | ICD-10-CM | POA: Diagnosis present

## 2011-11-29 DIAGNOSIS — A499 Bacterial infection, unspecified: Secondary | ICD-10-CM | POA: Diagnosis present

## 2011-11-29 DIAGNOSIS — B9689 Other specified bacterial agents as the cause of diseases classified elsewhere: Secondary | ICD-10-CM | POA: Diagnosis present

## 2011-11-29 DIAGNOSIS — M8700 Idiopathic aseptic necrosis of unspecified bone: Secondary | ICD-10-CM | POA: Diagnosis present

## 2011-11-29 DIAGNOSIS — N760 Acute vaginitis: Secondary | ICD-10-CM | POA: Diagnosis present

## 2011-11-29 DIAGNOSIS — F329 Major depressive disorder, single episode, unspecified: Secondary | ICD-10-CM | POA: Diagnosis present

## 2011-11-29 DIAGNOSIS — Z792 Long term (current) use of antibiotics: Secondary | ICD-10-CM

## 2011-11-29 DIAGNOSIS — F3289 Other specified depressive episodes: Secondary | ICD-10-CM | POA: Diagnosis present

## 2011-11-29 DIAGNOSIS — F411 Generalized anxiety disorder: Secondary | ICD-10-CM | POA: Diagnosis present

## 2011-11-29 DIAGNOSIS — M549 Dorsalgia, unspecified: Secondary | ICD-10-CM

## 2011-11-29 DIAGNOSIS — G8929 Other chronic pain: Secondary | ICD-10-CM | POA: Diagnosis present

## 2011-11-29 LAB — CBC WITH DIFFERENTIAL/PLATELET
Band Neutrophils: 0 % (ref 0–10)
Basophils Absolute: 0.1 10*3/uL (ref 0.0–0.1)
Basophils Relative: 1 % (ref 0–1)
Eosinophils Relative: 1 % (ref 0–5)
HCT: 28.8 % — ABNORMAL LOW (ref 36.0–46.0)
Hemoglobin: 10 g/dL — ABNORMAL LOW (ref 12.0–15.0)
Lymphocytes Relative: 17 % (ref 12–46)
Lymphs Abs: 2.1 10*3/uL (ref 0.7–4.0)
MCHC: 34.7 g/dL (ref 30.0–36.0)
Monocytes Absolute: 1.3 10*3/uL — ABNORMAL HIGH (ref 0.1–1.0)
Monocytes Relative: 11 % (ref 3–12)
Neutro Abs: 8.5 10*3/uL — ABNORMAL HIGH (ref 1.7–7.7)
Neutrophils Relative %: 70 % (ref 43–77)
Promyelocytes Absolute: 0 %
RBC: 3.02 MIL/uL — ABNORMAL LOW (ref 3.87–5.11)
WBC: 12.1 10*3/uL — ABNORMAL HIGH (ref 4.0–10.5)

## 2011-11-29 LAB — BASIC METABOLIC PANEL
Chloride: 101 mEq/L (ref 96–112)
GFR calc Af Amer: >90 mL/min (ref >90)
GFR calc non Af Amer: >90 mL/min (ref >90)
Potassium: 4.1 mEq/L (ref 3.5–5.1)
Sodium: 137 mEq/L (ref 135–145)

## 2011-11-29 LAB — RETICULOCYTES
RBC.: 3.02 MIL/uL — ABNORMAL LOW (ref 3.87–5.11)
Retic Ct Pct: 18.7 % — ABNORMAL HIGH (ref 0.4–3.1)

## 2011-11-29 LAB — PREGNANCY, URINE: Preg Test, Ur: NEGATIVE

## 2011-11-29 MED ORDER — ONDANSETRON HCL 4 MG/2ML IJ SOLN
INTRAMUSCULAR | Status: AC
  Filled 2011-11-29: qty 2

## 2011-11-29 MED ORDER — HYDROMORPHONE HCL PF 2 MG/ML IJ SOLN
2.0000 mg | Freq: Once | INTRAMUSCULAR | Status: AC
  Administered 2011-11-29: 2 mg via INTRAVENOUS
  Filled 2011-11-29: qty 1

## 2011-11-29 MED ORDER — SODIUM CHLORIDE 0.9 % IV SOLN
Freq: Once | INTRAVENOUS | Status: AC
  Administered 2011-11-30: 01:00:00 via INTRAVENOUS

## 2011-11-29 MED ORDER — ACETAMINOPHEN 325 MG PO TABS
650.0000 mg | ORAL_TABLET | Freq: Once | ORAL | Status: AC
  Administered 2011-11-29: 650 mg via ORAL
  Filled 2011-11-29: qty 2

## 2011-11-29 MED ORDER — ONDANSETRON HCL 4 MG/2ML IJ SOLN
4.0000 mg | Freq: Once | INTRAMUSCULAR | Status: AC
  Administered 2011-11-29: 4 mg via INTRAVENOUS

## 2011-11-29 MED ORDER — SODIUM CHLORIDE 0.9 % IV BOLUS (SEPSIS)
2000.0000 mL | Freq: Once | INTRAVENOUS | Status: AC
  Administered 2011-11-29: 2000 mL via INTRAVENOUS

## 2011-11-29 MED ORDER — FENTANYL CITRATE 0.05 MG/ML IJ SOLN
50.0000 ug | Freq: Once | INTRAMUSCULAR | Status: DC
  Filled 2011-11-29 (×2): qty 2

## 2011-11-29 NOTE — Discharge Summary (Signed)
Sickle Cell Medical Discharge Summary   Patient ID: [name] MRN: [id number] DOB/AGE: [birth year] 21 y.o.  Admit date: 11/28/2011 Discharge date: 11/29/2011  Primary Care Physician:  Dr. Willey Blade   Admission Diagnoses:  Active Problems: Sickle cell crisis  Vaginosis  Tachycardia   Discharge Diagnoses:   Sickle cell crisis Vaginosis Depression  Tachycardia     Discharge Medications:  Medication List  As of 11/29/2011  9:05 AM   ASK your doctor about these medications         ALPRAZolam 0.25 MG tablet   Commonly known as: XANAX   Take 1-2 tablets (0.25-0.5 mg total) by mouth every 3 (three) hours as needed for anxiety.      cholecalciferol 1000 UNITS tablet   Commonly known as: VITAMIN D   Take 1,000 Units by mouth daily.      clindamycin 300 MG capsule   Commonly known as: CLEOCIN   Take 1 capsule (300 mg total) by mouth every 12 (twelve) hours.      cyclobenzaprine 5 MG tablet   Commonly known as: FLEXERIL   Take 1 tablet (5 mg total) by mouth 2 (two) times daily as needed. Patient used this medication for her muscle spasms.      fluvoxaMINE 50 MG tablet   Commonly known as: LUVOX   Take 1 tablet (50 mg total) by mouth at bedtime.      hydroxyurea 500 MG capsule   Commonly known as: HYDREA   Take 1,000 mg by mouth daily. May take with food to minimize GI side effects.      ibuprofen 200 MG tablet   Commonly known as: ADVIL,MOTRIN   Take 600 mg by mouth daily. Patient used this medication for her pain.      metoprolol succinate 12.5 mg Tb24   Commonly known as: TOPROL-XL   Take 0.5 tablets (12.5 mg total) by mouth daily.      Oxycodone HCl 10 MG Tabs   Take 1 tablet (10 mg total) by mouth 4 (four) times daily as needed. For pain.      oxyCODONE 10 MG 12 hr tablet   Commonly known as: OXYCONTIN   Take 2 tablets (20 mg total) by mouth every 12 (twelve) hours.      pantoprazole 40 MG tablet   Commonly known as: PROTONIX   Take 40 mg by mouth  daily.             Consults:  None   Significant Diagnostic Studies:  Dg Chest 2 View  11/21/2011  *RADIOLOGY REPORT*  Clinical Data: 21 year old female with sickle cell disease.  Right lower extremity pain.  CHEST - 2 VIEW  Comparison: 10/06/2011 and earlier.  Findings: Stable lung volumes.  Cardiac size and mediastinal contours are within normal limits.  Visualized tracheal air column is within normal limits.  No pneumothorax, pulmonary edema, pleural effusion or acute pulmonary opacity.  Interval decreased vascularity.  Stable mild right suprahilar scarring.  Bony changes of sickle cell disease throughout the visible spine.  IMPRESSION: No acute cardiopulmonary abnormality.  Stable sequelae of sickle cell disease.  Original Report Authenticated By: Harley Hallmark, M.D.   Dg Hip Complete Right  11/24/2011  *RADIOLOGY REPORT*  Clinical Data: Right hip and groin pain.  Sickle cell crisis.  RIGHT HIP - COMPLETE 2+ VIEW  Comparison: CT pelvis 11/08/2011 and right hip series 03/15/2011.  Findings: No acute osseous or joint abnormality.  A sclerotic lesion at the junction of the  left femoral head and neck is unchanged and may represent a bone infarct.  IMPRESSION: No acute findings appear  Original Report Authenticated By: Reyes Ivan, M.D.   Ct Femur Right Wo Contrast  11/08/2011  *RADIOLOGY REPORT*  Clinical Data: Sickle cell crisis.  Severe right thigh pain.  CT OF THE RIGHT FEMUR WITHOUT CONTRAST  Comparison: 06/21/2011.  Findings: Vague sclerosis is present in the distal femoral metaphysis.  In a patient with history of sickle cell anemia, this could represent small area of infarction.  There is no AVN.  Mild subchondral sclerosis of the right acetabular roof could be associated with old bone infarct as well.  Soft tissues appear normal and symmetric.  There is no soft tissue fluid collection or inflammatory change.  Muscular compartments appear within normal limits.  No distortion of fat  planes.  The right thigh appears symmetric when compared to the left thigh. There is no inguinal adenopathy.  The visualized visceral pelvis appears within normal limits.  IMPRESSION:  1.  Normal soft tissues of the right thigh for noncontrast CT. 2.  Small areas of sclerosis in the right acetabular roof and distal femoral metaphysis may represent small bone infarcts in a patient with history of sickle cell anemia.  Original Report Authenticated By: Andreas Newport, M.D.     [facility] Course:  For complete details please refer to admission H and P, but in brief, [name]  Presented to the ED with similar sickle cell crisis pain noted elevated retctic count and leukocytosis . She was transition to the Sickle cell center for further observation and to transition to home.   BP 117/86  Pulse 99  Temp 98.3 F (36.8 C) (Oral)  Resp 16  SpO2 94%  LMP 11/04/2011 General Appearance: Drowsy but oriented, cooperative, well nourished, well developed, no apparent distress  Back: Symmetric, no curvature, impaired ROM, bilateral CVA tenderness, diffuse tenderness  Cardio: Regular rate and rhythm, S1, S2 normal, tachycardic at times, no murmur/click/rub/gallop  GI: Soft, distended, non-tender, hypoactive bowel sounds, no organomegaly  GU: Contact dermatitis/irritation in the lower inguinal/vaginal area, erythema  Extremities: Extremities normal, atraumatic, no cyanosis, no edema, Homans sign is negative, tender bilateral LEs, limited ROM bilateral LEs  Pulses: 2+ and symmetric   Disposition at Discharge: 01-Home or Self Care  Discharge Orders: See Discharge instructions   Condition at Discharge:   Stable  Time spent on Discharge:  Greater than 30 minutes.  Signed: EDWARDS, MICHELLE P 11/29/2011, 9:05 AM

## 2011-11-29 NOTE — ED Notes (Signed)
Pt. Refusing fentanyl. md made aware.

## 2011-11-29 NOTE — ED Notes (Signed)
Into b/r upon arrival to triage.

## 2011-11-29 NOTE — Progress Notes (Signed)
Discharge instructions reviewed; discharged ambulatory accompanied by fiance.

## 2011-11-29 NOTE — ED Notes (Addendum)
Here by boyfriend, returns after being d/c'd today, admitted for sickle cell pain crisis, returns for the same, "thought i was feeling better and ready to go, but I guess I wasn't", reports period started today. Fever noted at triage, "have taken usual meds w/o relief". Pinpoints pain to R leg, R groin and neck and back. Also feel sob.

## 2011-11-29 NOTE — ED Notes (Signed)
Attempted IV & lab draw x3 without success. Tolerated well.

## 2011-11-30 ENCOUNTER — Inpatient Hospital Stay (HOSPITAL_COMMUNITY): Payer: Medicaid Other

## 2011-11-30 ENCOUNTER — Encounter (HOSPITAL_COMMUNITY): Payer: Self-pay | Admitting: Internal Medicine

## 2011-11-30 DIAGNOSIS — M549 Dorsalgia, unspecified: Secondary | ICD-10-CM

## 2011-11-30 DIAGNOSIS — D5700 Hb-SS disease with crisis, unspecified: Principal | ICD-10-CM

## 2011-11-30 LAB — URINALYSIS, ROUTINE W REFLEX MICROSCOPIC
Leukocytes, UA: NEGATIVE
Protein, ur: NEGATIVE mg/dL
Specific Gravity, Urine: 1.01 (ref 1.005–1.030)
Urobilinogen, UA: 4 mg/dL — ABNORMAL HIGH (ref 0.0–1.0)

## 2011-11-30 LAB — CBC WITH DIFFERENTIAL/PLATELET
Band Neutrophils: 0 % (ref 0–10)
Basophils Relative: 1 % (ref 0–1)
Blasts: 0 %
HCT: 25.2 % — ABNORMAL LOW (ref 36.0–46.0)
Hemoglobin: 8.7 g/dL — ABNORMAL LOW (ref 12.0–15.0)
Lymphocytes Relative: 25 % (ref 12–46)
Lymphs Abs: 2.8 10*3/uL (ref 0.7–4.0)
Monocytes Absolute: 0.9 10*3/uL (ref 0.1–1.0)
Monocytes Relative: 8 % (ref 3–12)
Neutro Abs: 6.6 10*3/uL (ref 1.7–7.7)
RDW: 18.7 % — ABNORMAL HIGH (ref 11.5–15.5)
WBC: 11 10*3/uL — ABNORMAL HIGH (ref 4.0–10.5)

## 2011-11-30 LAB — COMPREHENSIVE METABOLIC PANEL
BUN: 6 mg/dL (ref 6–23)
Calcium: 8.5 mg/dL (ref 8.4–10.5)
GFR calc Af Amer: >90 mL/min (ref >90)
Glucose, Bld: 122 mg/dL — ABNORMAL HIGH (ref 70–99)
Total Protein: 6.3 g/dL (ref 6.0–8.3)

## 2011-11-30 LAB — CARDIAC PANEL(CRET KIN+CKTOT+MB+TROPI)
CK, MB: 2 ng/mL (ref 0.3–4.0)
Total CK: 477 U/L — ABNORMAL HIGH (ref 7–177)

## 2011-11-30 IMAGING — US US OB COMP LESS 14 WK
1 series · 14 of 28 positions shown · non-contrast
Comparison: None

CLINICAL DATA: Left-sided pain; EGA by LMP is 5 weeks 1 day

OBSTETRIC <14 WK US AND TRANSVAGINAL OB US
TECHNIQUE: Both transabdominal and transvaginal ultrasound
examinations were performed for complete evaluation of the
gestation as well as the maternal uterus, adnexal regions, and
pelvic cul-de-sac.  Transvaginal technique was performed to assess
early pregnancy.

[Series 1: us ob comp less 14 wk · 0.34mm/px · 14 of 40 slices shown]
[im 2/40]
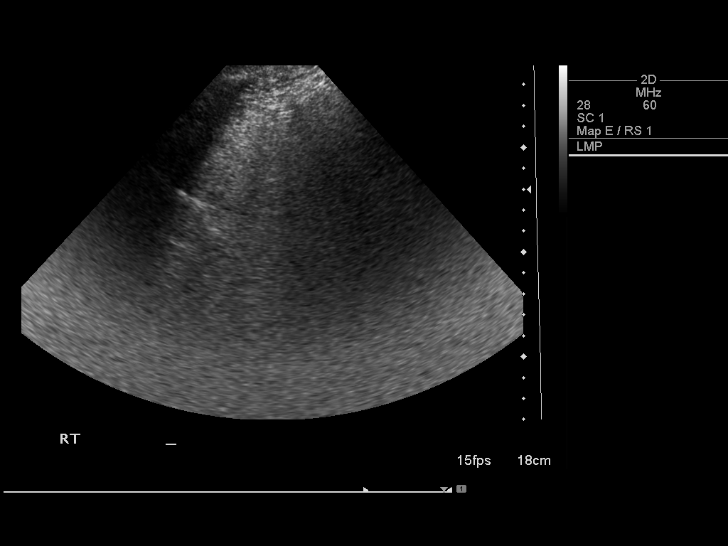
[im 5/40]
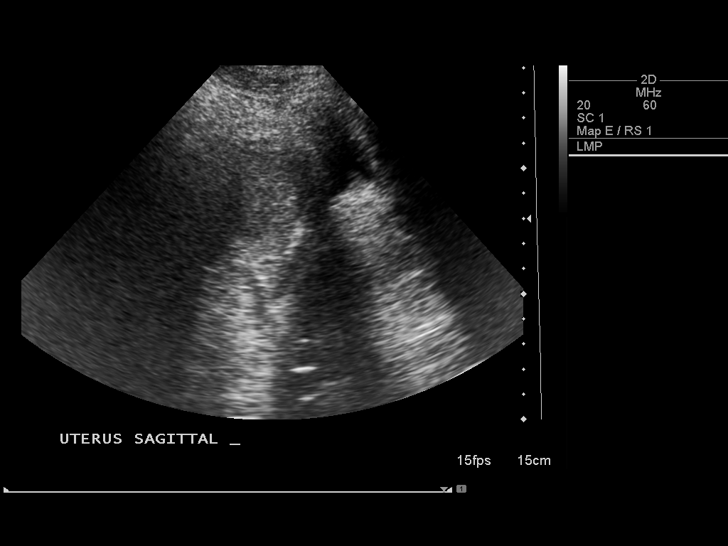
[im 8/40]
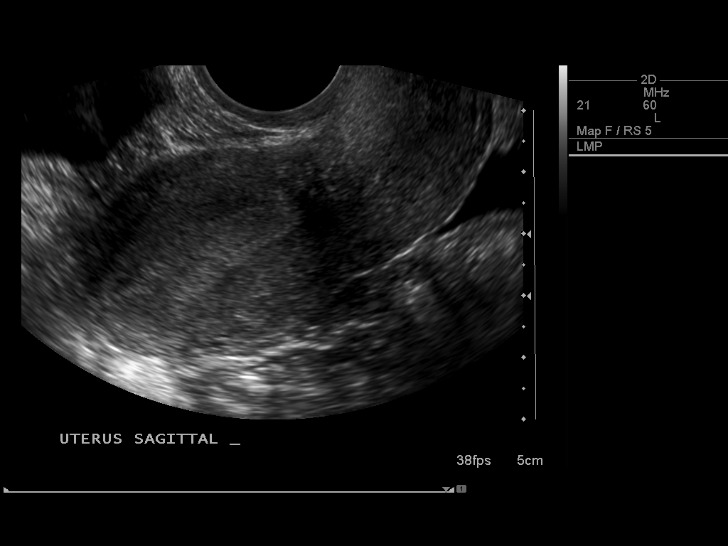
[im 11/40]
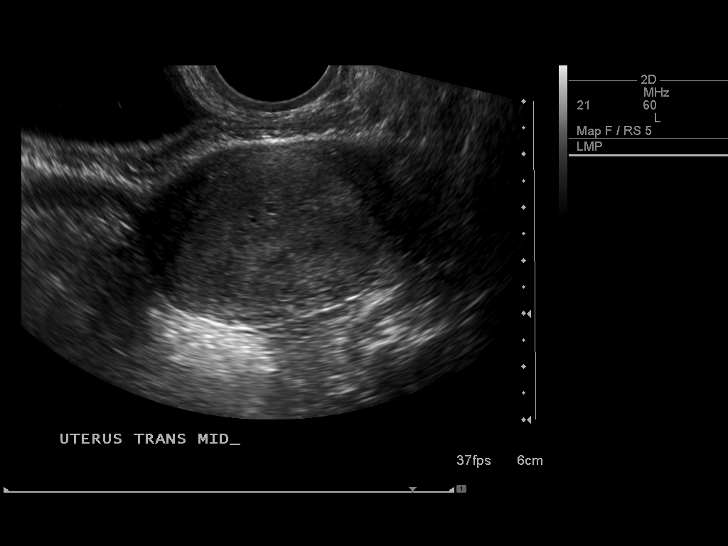
[im 14/40]
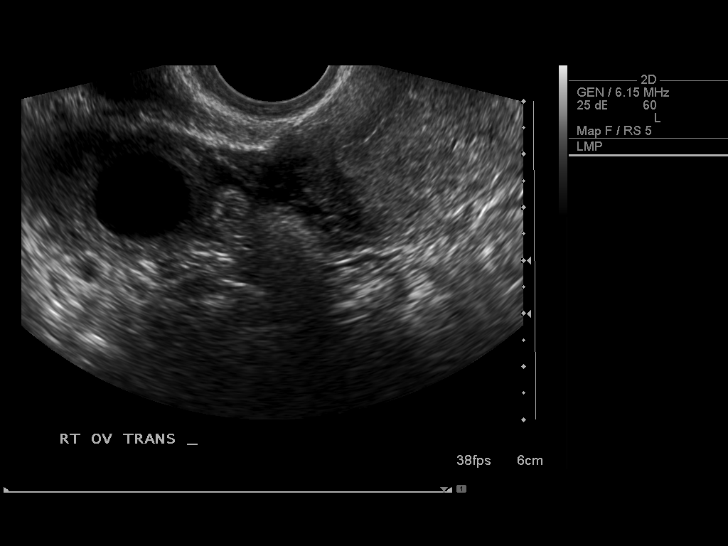
[im 16/40]
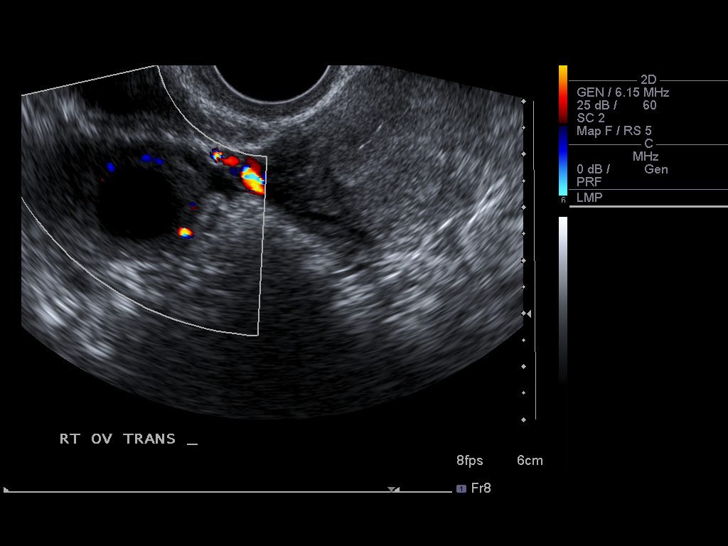
[im 19/40]
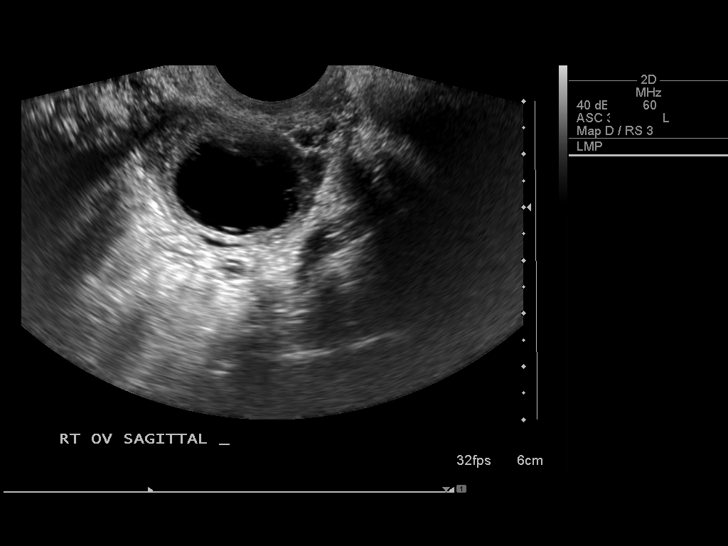
[im 22/40]
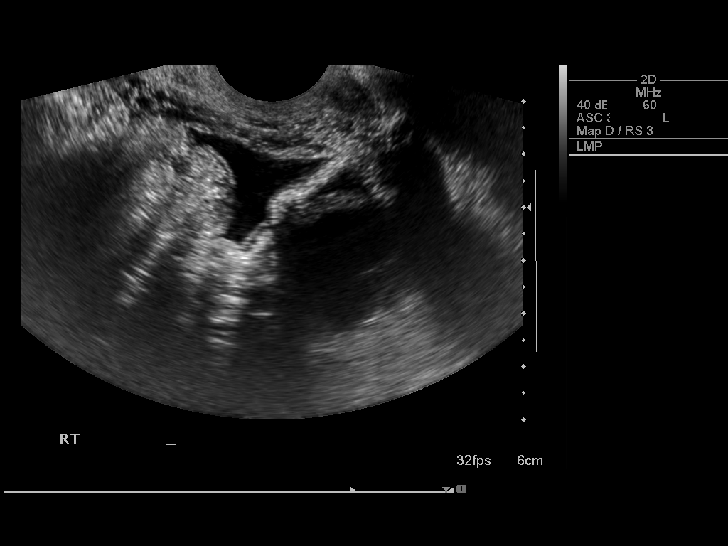
[im 25/40]
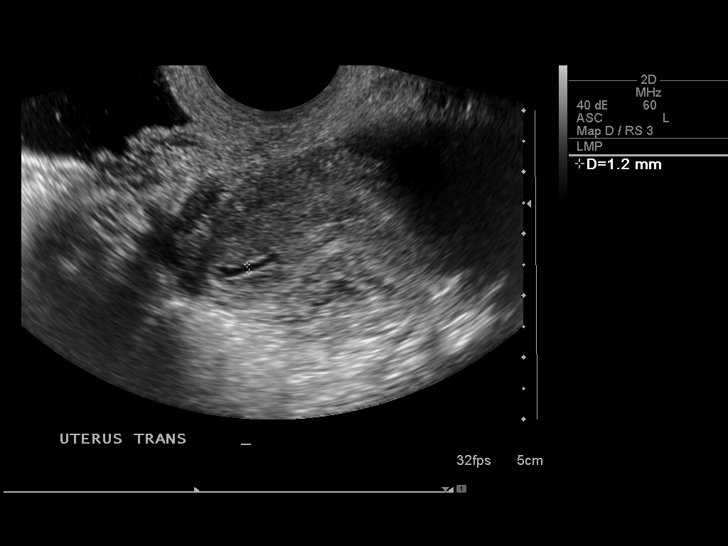
[im 28/40]
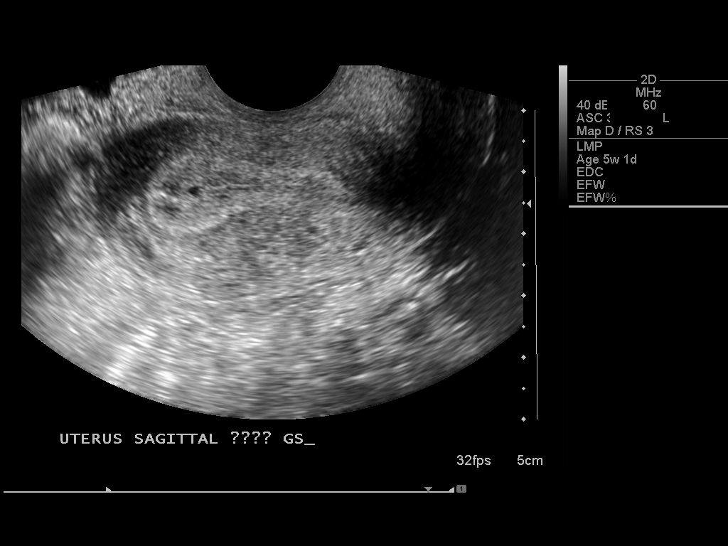
[im 31/40]
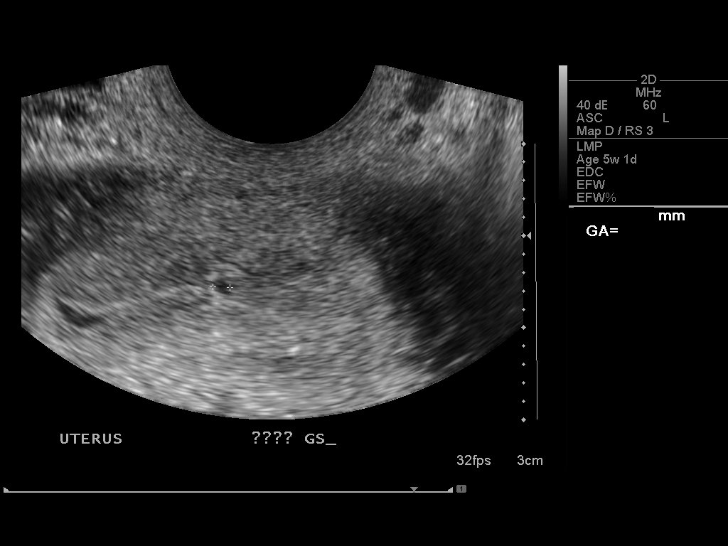
[im 34/40]
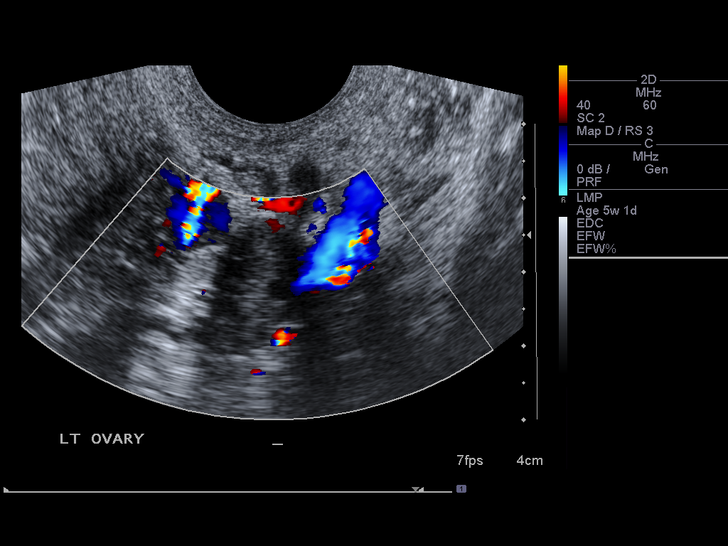
[im 37/40]
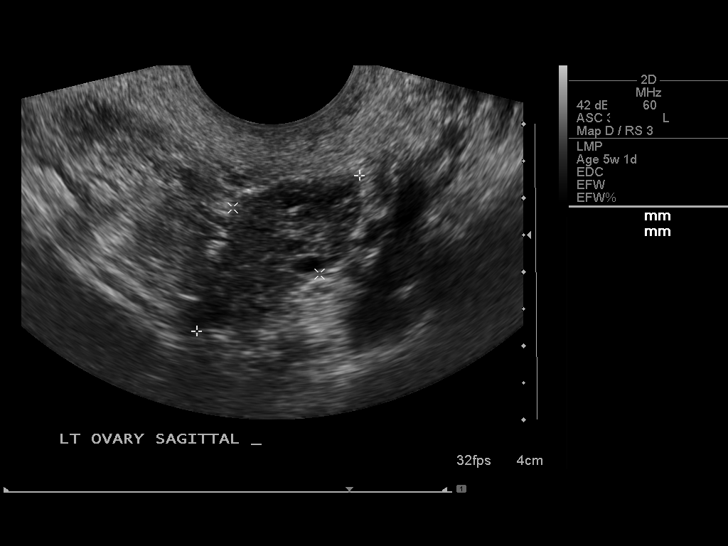
[im 40/40]
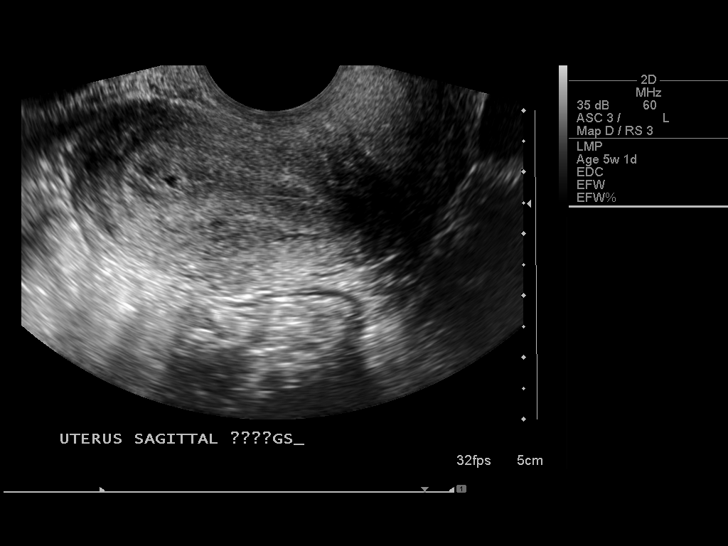

[14 of 28 positions shown; findings below may reference images not displayed]

There is no beta HCG level for correlation.

Intrauterine gestational sac:  .  Suspected

Yolk sac: None
Embryo: No
Cardiac Activity: No

MSD: 2.4  mm  4    w 6    d US EDC: September 18, 2011

Maternal uterus/adnexae:
Both ovaries have a normal appearance with a 2.4 cm corpus luteum
cyst on the right ovary.  There is a small amount of free pelvic
fluid.  There is also a small amount of free fluid within the
endometrial cavity at the fundus.
IMPRESSION: There is a 2.4 mm round fluid collection within the endometrial
cavity which is suspicious for an early gestational sac (4w6d).  A
yolk sac and fetal pole are not seen but are not yet expected at
this early gestational age.  Please continue to follow closely with
serial beta HCG levels and / or ultrasound.

## 2011-11-30 MED ORDER — ACETAMINOPHEN 650 MG RE SUPP: 650.0000 mg | Freq: Four times a day (QID) | RECTAL | Status: DC | PRN

## 2011-11-30 MED ORDER — ONDANSETRON HCL 4 MG PO TABS: 4.0000 mg | ORAL_TABLET | Freq: Four times a day (QID) | ORAL | Status: DC | PRN

## 2011-11-30 MED ORDER — ACETAMINOPHEN 325 MG PO TABS: 650.0000 mg | ORAL_TABLET | Freq: Four times a day (QID) | ORAL | Status: DC | PRN

## 2011-11-30 MED ORDER — CYCLOBENZAPRINE HCL 10 MG PO TABS
5.0000 mg | ORAL_TABLET | Freq: Two times a day (BID) | ORAL | Status: DC | PRN
  Administered 2011-12-01 – 2011-12-02 (×2): 5 mg via ORAL
  Filled 2011-11-30 (×2): qty 1

## 2011-11-30 MED ORDER — HYDROMORPHONE HCL PF 2 MG/ML IJ SOLN
2.0000 mg | Freq: Once | INTRAMUSCULAR | Status: AC
  Administered 2011-11-30: 2 mg via INTRAVENOUS
  Filled 2011-11-30: qty 1

## 2011-11-30 MED ORDER — ONDANSETRON HCL 4 MG/2ML IJ SOLN
4.0000 mg | Freq: Four times a day (QID) | INTRAMUSCULAR | Status: DC | PRN
  Administered 2011-12-01: 4 mg via INTRAVENOUS
  Filled 2011-11-30: qty 2

## 2011-11-30 MED ORDER — CLINDAMYCIN HCL 300 MG PO CAPS
300.0000 mg | ORAL_CAPSULE | Freq: Two times a day (BID) | ORAL | Status: DC
  Administered 2011-11-30 – 2011-12-02 (×6): 300 mg via ORAL
  Filled 2011-11-30 (×9): qty 1

## 2011-11-30 MED ORDER — HYDROMORPHONE HCL PF 1 MG/ML IJ SOLN
2.0000 mg | INTRAMUSCULAR | Status: DC | PRN
  Administered 2011-12-01 (×3): 2 mg via INTRAVENOUS
  Filled 2011-11-30 (×3): qty 2

## 2011-11-30 MED ORDER — SODIUM CHLORIDE 0.9 % IV SOLN
INTRAVENOUS | Status: DC
  Administered 2011-11-30 – 2011-12-01 (×4): via INTRAVENOUS

## 2011-11-30 MED ORDER — FLUVOXAMINE MALEATE 50 MG PO TABS
50.0000 mg | ORAL_TABLET | Freq: Every day | ORAL | Status: DC
  Administered 2011-11-30 – 2011-12-02 (×3): 50 mg via ORAL
  Filled 2011-11-30 (×4): qty 1

## 2011-11-30 MED ORDER — METOPROLOL SUCCINATE 12.5 MG HALF TABLET
12.5000 mg | ORAL_TABLET | Freq: Every day | ORAL | Status: DC
  Administered 2011-11-30 – 2011-12-03 (×4): 12.5 mg via ORAL
  Filled 2011-11-30 (×4): qty 1

## 2011-11-30 MED ORDER — SODIUM CHLORIDE 0.9 % IJ SOLN
3.0000 mL | Freq: Two times a day (BID) | INTRAMUSCULAR | Status: DC
  Administered 2011-11-30 – 2011-12-02 (×5): 3 mL via INTRAVENOUS

## 2011-11-30 MED ORDER — OXYCODONE HCL 10 MG PO TB12
20.0000 mg | ORAL_TABLET | Freq: Two times a day (BID) | ORAL | Status: DC
  Administered 2011-11-30: 10 mg via ORAL
  Administered 2011-11-30 – 2011-12-03 (×5): 20 mg via ORAL
  Filled 2011-11-30 (×7): qty 2

## 2011-11-30 MED ORDER — HYDROXYUREA 500 MG PO CAPS
1000.0000 mg | ORAL_CAPSULE | Freq: Every day | ORAL | Status: DC
  Administered 2011-11-30 – 2011-12-03 (×4): 1000 mg via ORAL
  Filled 2011-11-30 (×4): qty 2

## 2011-11-30 MED ORDER — HYDROMORPHONE HCL PF 2 MG/ML IJ SOLN
2.0000 mg | INTRAMUSCULAR | Status: DC | PRN
  Administered 2011-11-30 (×5): 2 mg via INTRAVENOUS
  Filled 2011-11-30 (×5): qty 2

## 2011-11-30 MED ORDER — IOHEXOL 350 MG/ML SOLN
200.0000 mL | Freq: Once | INTRAVENOUS | Status: AC | PRN
  Administered 2011-11-30: 200 mL via INTRAVENOUS

## 2011-11-30 MED ORDER — ALPRAZOLAM 0.25 MG PO TABS: 0.2500 mg | ORAL_TABLET | ORAL | Status: DC | PRN

## 2011-11-30 NOTE — Progress Notes (Signed)
Brief Nutrition Note  Patient identified on the Nutrition Risk Report for unintentional weight loss. Pt reports change in intake and increase physical activity for intentional weight loss.  Wt Readings from Last 10 Encounters:  11/30/11 197 lb 6.4 oz (89.54 kg) (97.12%*)  11/19/11 182 lb (82.555 kg) (94.85%*)  11/12/11 182 lb 15.7 oz (83 kg) (95.06%*)  11/01/11 193 lb 5.5 oz (87.7 kg) (96.68%*)  10/14/11 181 lb 14.1 oz (82.5 kg) (94.86%*)  08/22/11 178 lb 8 oz (80.967 kg) (94.19%*)  08/17/11 190 lb (86.183 kg) (96.28%*)  07/20/11 172 lb (78.019 kg) (92.49%*)  07/13/11 178 lb 4.8 oz (80.876 kg) (94.20%*)  06/21/11 175 lb (79.379 kg) (93.42%*)   * Growth percentiles are based on CDC 2-20 Years data.      Body mass index is 34.97 kg/(m^2). Pt meets criteria for obesity class 1 based on current BMI. Some weight loss would be beneficial for this pt.   Current diet order is Regular, no meals documented for this admission. Labs and medications reviewed.   No further nutrition interventions warranted at this time. If additional nutrition issues arise, please re-consult RD.   Clarene Duke RD, LDN Pager [phone] After Hours pager [phone]

## 2011-11-30 NOTE — Progress Notes (Signed)
Subjective:  Patientreturned to the hospital after exacerbation of her arthralgias. She presently rates the pain as a 7/10. There is no chest pains or shortness of breath. She has more aching pain in her upper back area at this time. Patient's blood work was reviewed. Her blood work is not consistent with active crisis. She reports she may have gotten too hot yesterday without adequate hydration.   Allergies  Allergen Reactions  . Toradol (Ketorolac Tromethamine) Hives and Itching   Current Facility-Administered Medications  Medication Dose Route Frequency Provider Last Rate Last Dose  . 0.9 %  sodium chloride infusion   Intravenous Once Laray Anger, DO      . 0.9 %  sodium chloride infusion   Intravenous Continuous Eduard Clos, MD 125 mL/hr at 11/30/11 1809    . acetaminophen (TYLENOL) tablet 650 mg  650 mg Oral Q6H PRN Eduard Clos, MD       Or  . acetaminophen (TYLENOL) suppository 650 mg  650 mg Rectal Q6H PRN Eduard Clos, MD      . ALPRAZolam Prudy Feeler) tablet 0.25-0.5 mg  0.25-0.5 mg Oral Q3H PRN Eduard Clos, MD      . clindamycin (CLEOCIN) capsule 300 mg  300 mg Oral Q12H Eduard Clos, MD   300 mg at 11/30/11 1652  . cyclobenzaprine (FLEXERIL) tablet 5 mg  5 mg Oral BID PRN Eduard Clos, MD      . fluvoxaMINE (LUVOX) tablet 50 mg  50 mg Oral QHS Eduard Clos, MD   50 mg at 11/30/11 2207  . HYDROmorphone (DILAUDID) injection 2 mg  2 mg Intravenous Once Jamesetta Orleans Lawyer, PA-C   2 mg at 11/29/11 2258  . HYDROmorphone (DILAUDID) injection 2 mg  2 mg Intravenous Once Jamesetta Orleans Lawyer, PA-C   2 mg at 11/30/11 0007  . HYDROmorphone (DILAUDID) injection 2 mg  2 mg Intravenous Q3H PRN Gwenyth Bender, MD      . hydroxyurea (HYDREA) capsule 1,000 mg  1,000 mg Oral Daily Eduard Clos, MD   1,000 mg at 11/30/11 0916  . iohexol (OMNIPAQUE) 350 MG/ML injection 200 mL  200 mL Intravenous Once PRN Dierdre Forth, MD   200 mL at  11/30/11 0401  . metoprolol succinate (TOPROL-XL) 24 hr tablet 12.5 mg  12.5 mg Oral Daily Eduard Clos, MD   12.5 mg at 11/30/11 0918  . ondansetron (ZOFRAN) injection 4 mg  4 mg Intravenous Once Jamesetta Orleans Lawyer, PA-C   4 mg at 11/29/11 2331  . ondansetron (ZOFRAN) tablet 4 mg  4 mg Oral Q6H PRN Eduard Clos, MD       Or  . ondansetron [facility]) injection 4 mg  4 mg Intravenous Q6H PRN Eduard Clos, MD      . oxyCODONE (OXYCONTIN) 12 hr tablet 20 mg  20 mg Oral Q12H Eduard Clos, MD   10 mg at 11/30/11 2207  . sodium chloride 0.9 % bolus 2,000 mL  2,000 mL Intravenous Once Jamesetta Orleans Lawyer, PA-C   2,000 mL at 11/29/11 2237  . sodium chloride 0.9 % injection 3 mL  3 mL Intravenous Q12H Eduard Clos, MD   3 mL at 11/30/11 0415  . DISCONTD: fentaNYL (SUBLIMAZE) injection 50 mcg  50 mcg Intravenous Once Laray Anger, DO      . DISCONTD: HYDROmorphone (DILAUDID) injection 2 mg  2 mg Intravenous Q3H PRN Eduard Clos, MD   2  mg at 11/30/11 2030    Objective: Blood pressure 118/51, pulse 104, temperature 99.2 F (37.3 C), temperature source Oral, resp. rate 18, height 5\' 3"  (1.6 m), weight 197 lb 6.4 oz (89.54 kg), last menstrual period 11/29/2011, SpO2 98.00%, not currently breastfeeding.  Well-developed well-nourished black female in no acute distress. HEENT:nno sinus tenderness. No sclera icterus. TMs clear. NECK:no enlarged thyroid. No posterior cervical nodes. LUNGS:clear to auscultation. ZO:XWRUEA S1, S2 without S3. VWU:JWJX, nontender. BJY:NWGNFAOZHY in the upper paraspinal muscles. No mass or fullness. No erythema. Negative Homans. No edema. NEURO:nonfocal.  Lab results: Results for orders placed during the hospital encounter of 11/29/11 (from the past 48 hour(s))  BASIC METABOLIC PANEL     Status: Abnormal   Collection Time   11/29/11  9:36 PM      Component Value Range Comment   Sodium 137  135 - 145 mEq/L    Potassium 4.1   3.5 - 5.1 mEq/L    Chloride 101  96 - 112 mEq/L    CO2 24  19 - 32 mEq/L    Glucose, Bld 105 (*) 70 - 99 mg/dL    BUN 6  6 - 23 mg/dL    Creatinine, Ser 8.65  0.50 - 1.10 mg/dL    Calcium 9.3  8.4 - 78.4 mg/dL    GFR calc non Af Amer >90  >90 mL/min    GFR calc Af Amer >90  >90 mL/min   CBC WITH DIFFERENTIAL     Status: Abnormal   Collection Time   11/29/11  9:36 PM      Component Value Range Comment   WBC 12.1 (*) 4.0 - 10.5 K/uL    RBC 3.02 (*) 3.87 - 5.11 MIL/uL    Hemoglobin 10.0 (*) 12.0 - 15.0 g/dL    HCT 69.6 (*) 29.5 - 46.0 %    MCV 95.4  78.0 - 100.0 fL    MCH 33.1  26.0 - 34.0 pg    MCHC 34.7  30.0 - 36.0 g/dL    RDW 28.4 (*) 13.2 - 15.5 %    Platelets 472 (*) 150 - 400 K/uL    Neutrophils Relative 70  43 - 77 %    Lymphocytes Relative 17  12 - 46 %    Monocytes Relative 11  3 - 12 %    Eosinophils Relative 1  0 - 5 %    Basophils Relative 1  0 - 1 %    Band Neutrophils 0  0 - 10 %    Metamyelocytes Relative 0      Myelocytes 0      Promyelocytes Absolute 0      Blasts 0      nRBC 0  0 /100 WBC    Neutro Abs 8.5 (*) 1.7 - 7.7 K/uL    Lymphs Abs 2.1  0.7 - 4.0 K/uL    Monocytes Absolute 1.3 (*) 0.1 - 1.0 K/uL    Eosinophils Absolute 0.1  0.0 - 0.7 K/uL    Basophils Absolute 0.1  0.0 - 0.1 K/uL    RBC Morphology POLYCHROMASIA PRESENT     RETICULOCYTES     Status: Abnormal   Collection Time   11/29/11  9:36 PM      Component Value Range Comment   Retic Ct Pct 18.7 (*) 0.4 - 3.1 %    RBC. 3.02 (*) 3.87 - 5.11 MIL/uL    Retic Count, Manual 564.7 (*) 19.0 - 186.0 K/uL   POCT  I-STAT TROPONIN I     Status: Normal   Collection Time   11/29/11  9:50 PM      Component Value Range Comment   Troponin i, poc 0.01  0.00 - 0.08 ng/mL    Comment 3            URINALYSIS, ROUTINE W REFLEX MICROSCOPIC     Status: Abnormal   Collection Time   11/29/11 11:24 PM      Component Value Range Comment   Color, Urine YELLOW  YELLOW    APPearance CLEAR  CLEAR    Specific Gravity,  Urine 1.010  1.005 - 1.030    pH 7.5  5.0 - 8.0    Glucose, UA NEGATIVE  NEGATIVE mg/dL    Hgb urine dipstick LARGE (*) NEGATIVE    Bilirubin Urine NEGATIVE  NEGATIVE    Ketones, ur NEGATIVE  NEGATIVE mg/dL    Protein, ur NEGATIVE  NEGATIVE mg/dL    Urobilinogen, UA 4.0 (*) 0.0 - 1.0 mg/dL    Nitrite NEGATIVE  NEGATIVE    Leukocytes, UA NEGATIVE  NEGATIVE   PREGNANCY, URINE     Status: Normal   Collection Time   11/29/11 11:24 PM      Component Value Range Comment   Preg Test, Ur NEGATIVE  NEGATIVE   URINE MICROSCOPIC-ADD ON     Status: Abnormal   Collection Time   11/29/11 11:24 PM      Component Value Range Comment   Squamous Epithelial / LPF FEW (*) RARE    WBC, UA 0-2  <3 WBC/hpf    RBC / HPF 0-2  <3 RBC/hpf    Bacteria, UA RARE  RARE   CARDIAC PANEL(CRET KIN+CKTOT+MB+TROPI)     Status: Abnormal   Collection Time   11/30/11  3:09 AM      Component Value Range Comment   Total CK 477 (*) 7 - 177 U/L    CK, MB 2.0  0.3 - 4.0 ng/mL    Troponin I <0.30  <0.30 ng/mL    Relative Index 0.4  0.0 - 2.5   COMPREHENSIVE METABOLIC PANEL     Status: Abnormal   Collection Time   11/30/11  3:09 AM      Component Value Range Comment   Sodium 141  135 - 145 mEq/L    Potassium 3.8  3.5 - 5.1 mEq/L    Chloride 107  96 - 112 mEq/L    CO2 24  19 - 32 mEq/L    Glucose, Bld 122 (*) 70 - 99 mg/dL    BUN 6  6 - 23 mg/dL    Creatinine, Ser 1.61  0.50 - 1.10 mg/dL    Calcium 8.5  8.4 - 09.6 mg/dL    Total Protein 6.3  6.0 - 8.3 g/dL    Albumin 3.2 (*) 3.5 - 5.2 g/dL    AST 44 (*) 0 - 37 U/L    ALT 44 (*) 0 - 35 U/L    Alkaline Phosphatase 63  39 - 117 U/L    Total Bilirubin 1.1  0.3 - 1.2 mg/dL    GFR calc non Af Amer >90  >90 mL/min    GFR calc Af Amer >90  >90 mL/min   CBC WITH DIFFERENTIAL     Status: Abnormal   Collection Time   11/30/11  3:09 AM      Component Value Range Comment   WBC 11.0 (*) 4.0 - 10.5 K/uL    RBC  2.65 (*) 3.87 - 5.11 MIL/uL    Hemoglobin 8.7 (*) 12.0 - 15.0 g/dL     HCT 16.1 (*) 09.6 - 46.0 %    MCV 95.1  78.0 - 100.0 fL    MCH 32.8  26.0 - 34.0 pg    MCHC 34.5  30.0 - 36.0 g/dL    RDW 04.5 (*) 40.9 - 15.5 %    Platelets 410 (*) 150 - 400 K/uL    Neutrophils Relative 61  43 - 77 %    Lymphocytes Relative 25  12 - 46 %    Monocytes Relative 8  3 - 12 %    Eosinophils Relative 5  0 - 5 %    Basophils Relative 1  0 - 1 %    Band Neutrophils 0  0 - 10 %    Metamyelocytes Relative 0      Myelocytes 0      Promyelocytes Absolute 0      Blasts 0      nRBC 0  0 /100 WBC    Neutro Abs 6.6  1.7 - 7.7 K/uL    Lymphs Abs 2.8  0.7 - 4.0 K/uL    Monocytes Absolute 0.9  0.1 - 1.0 K/uL    Eosinophils Absolute 0.6  0.0 - 0.7 K/uL    Basophils Absolute 0.1  0.0 - 0.1 K/uL    RBC Morphology POLYCHROMASIA PRESENT       Studies/Results: Dg Chest 2 View  11/29/2011  *RADIOLOGY REPORT*  Clinical Data: Cough, shortness of breath  CHEST - 2 VIEW  Comparison: 11/21/2011  Findings: Lung volumes are low with crowding of the bronchovascular markings.  Heart size upper limits of normal.  Mild central bronchial wall thickening noted.  Biconcave vertebral bodies have appearance typical for sickle cell disease.  No pleural effusion. No acute osseous finding. Sclerosis in the humeral heads bilaterally is also compatible with sickle cell disease.  IMPRESSION: Central bronchial wall thickening which may indicate bronchitis.  Biconcave vertebral body deformities compatible with sickle cell disease.  Original Report Authenticated By: Harrel Lemon, M.D.   Ct Angio Chest W/cm &/or Wo Cm  11/30/2011  *RADIOLOGY REPORT*  Clinical Data: Back pain.  CT ANGIOGRAPHY CHEST  Technique:  Multidetector CT imaging of the chest using the standard protocol during bolus administration of intravenous contrast. Multiplanar reconstructed images including MIPs were obtained and reviewed to evaluate the vascular anatomy.  Contrast: OMNIPAQUE IOHEXOL 350 MG/ML SOLN  Comparison: None.   Findings: Initial injection resulted in poor contrast bolus requiring re-injection for diagnostic quality images.  Subsequent images demonstrate adequate study technically with moderately good opacification of the central and segmental pulmonary arteries.  No focal filling defects are identified.  No evidence of significant pulmonary embolus.  Normal heart size.  Normal caliber thoracic aorta.  Visualized portions of the upper abdominal organs are unremarkable.  The esophagus is decompressed.  No significant lymphadenopathy in the chest.  No pleural effusion.  Respiratory motion artifact and dependent atelectatic changes in the lungs.  No focal airspace consolidation or interstitial changes.  No pneumothorax.  Vague indeterminate nodule in the left lower lung measures about 4 mm diameter and is likely to be benign. Normal alignment of the thoracic vertebrae.  Biconcave appearance of the thoracic vertebra with increased density suggesting sickle cell changes.  IMPRESSION: No evidence of significant pulmonary embolus.  Original Report Authenticated By: Marlon Pel, M.D.    Patient Active Problem List  Diagnosis  .  SICKLE CELL ANEMIA  . TRICHOTILLOMANIA  . Active smoker  . Back pain  . Depression  . GERD (gastroesophageal reflux disease)  . Contraception management  . Stress  . Overweight  . Vaso-occlusive sickle cell crisis  . Right thigh pain  . Vaginosis  . Tachycardia    Impression: Sickle cell anemia with muscle skeletal pain. Isolated CPK. Rule out mild muscle trauma. Rule out recent heat exhaustion. Anxiety disorder with depression. Situational stress. Tobacco abuse.   Plan: Continue hydration overnight. Followup CBC, CMET and CPK. Continue home medications. Anticipate discharge soon.   August Saucer, Kandi Brusseau 11/30/2011 10:09 PM

## 2011-11-30 NOTE — ED Notes (Signed)
Hospitalist on the way to see pt. Before admission.

## 2011-11-30 NOTE — Discharge Summary (Signed)
Physician Discharge Summary  Patient ID: [name] MRN: [id number] DOB/AGE: March 14, 1991 21 y.o.  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 History of tobacco abuse, improving Anxiety disorder. GERD  Discharged Condition: Improved  Operations/Procedues: None   Hospital Course: Patient was admitted for further treatment of her sickle cell crisis with associated right leg pain. She was started on IV fluids. She was given IV Dilaudid for pain control. She was given Zofran and IV Benadryl respectively for nausea and pruritus. 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 She had a CPK and aldolase level which was normal. Patient was continued on supportive measures. Her pain did gradually improve. She did complain od transient pelvic pain otherwise. There is no evidence for associated infection. After several days of therapy her pain is now decreased to the point that she can manage her pain at home. It is felt that patient had been overly active at the time of precipitating her musculoskeletal pain. Patient education was given.   Significant Diagnostic Studies:  MRI scan of the right hip.   Disposition: 01-Home or Self Care  Discharge Orders    Future Orders Please Complete By Expires   Diet - low sodium heart healthy      Diet - low sodium heart healthy      Increase activity slowly      No wound care      Call MD for:  severe uncontrolled pain      Discharge instructions      Comments:   Follow up with primary care physician in one week.   Increase activity slowly      No wound care      Call MD for:  severe uncontrolled pain      Discharge instructions      Comments:   Follow up with primary care physician in one week.     Discharge Medications Oxycodone 10 mg every 4 hours when  necessary Vitamin D 1000 units daily Flexeril 5 mg twice a day when necessary Hydroxyurea 1000 mg daily    Signed: Almer Bushey 11/14/2011, 06:00PM

## 2011-11-30 NOTE — Progress Notes (Signed)
Utilization Review Completed.[name] T7/17/2013   

## 2011-11-30 NOTE — H&P (Signed)
[name] is an 21 y.o. female.   PCP - Dr.Eric August Saucer. Chief Complaint: Back pain. HPI: 21 year-old female with known history of sickle cell disease who was just discharged yesterday after being observed in the sickle cell unit presents back to the ER with persistent pain clinic in the back and hip area with occasional chest pains which happens when she takes deep breaths. Denies any subjective feeling of fever chills or any productive cough. Her pain in the back is mostly upper back the right side. It increases on movement. Despite giving multiple doses of pain relief medications patient is still having pain and has been admitted for pain control secondary to sickle cell disease.  Past Medical History  Diagnosis Date  . H/O: 1 miscarriage 03/22/2011  . 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 Depression 01/06/2011  . GERD (gastroesophageal reflux disease) 02/17/2011  . Trichotillomania     h/o  . Blood transfusion     "lots"  . Sickle cell anemia with crisis   . Exertional dyspnea     "sometimes"  . Sickle cell anemia   . Headache   . Migraines 11/08/11    "@ least twice/month"  . Chronic back pain     "very severe; have knot in my back; from tight muscle; take RX and exercise for it"  . Mood swings 11/08/11    "I go back and forth; real bad"    Past Surgical History  Procedure Date  . Cholecystectomy 05/2010  . Dilation and curettage of uterus 02/20/11    S/P miscarriage    History reviewed. No pertinent family history. Social History:  reports that she has never smoked. She has never used smokeless tobacco. She reports that she uses illicit drugs (Marijuana). She reports that she does not drink alcohol.  Allergies:  Allergies  Allergen Reactions  . Toradol (Ketorolac Tromethamine) Hives and Itching    Medications Prior to Admission  Medication Sig Dispense Refill  . ALPRAZolam (XANAX) 0.25 MG tablet Take 1-2 tablets (0.25-0.5 mg total) by mouth every 3 (three) hours as needed  for anxiety.  60 tablet  1  . cholecalciferol (VITAMIN D) 1000 UNITS tablet Take 1,000 Units by mouth daily.      . clindamycin (CLEOCIN) 300 MG capsule Take 1 capsule (300 mg total) by mouth every 12 (twelve) hours.  20 capsule  0  . cyclobenzaprine (FLEXERIL) 5 MG tablet Take 5 mg by mouth 2 (two) times daily as needed. muscle spasms.      . fluvoxaMINE (LUVOX) 50 MG tablet Take 1 tablet (50 mg total) by mouth at bedtime.  30 tablet  1  . hydroxyurea (HYDREA) 500 MG capsule Take 1,000 mg by mouth daily. May take with food to minimize GI side effects.      . metoprolol succinate (TOPROL-XL) 12.5 mg TB24 Take 0.5 tablets (12.5 mg total) by mouth daily.  60 tablet  0  . Oxycodone HCl 10 MG TABS Take 1 tablet (10 mg total) by mouth 4 (four) times daily as needed. For pain.  60 tablet  0    Results for orders placed during the hospital encounter of 11/29/11 (from the past 48 hour(s))  BASIC METABOLIC PANEL     Status: Abnormal   Collection Time   11/29/11  9:36 PM      Component Value Range Comment   Sodium 137  135 - 145 mEq/L    Potassium 4.1  3.5 - 5.1 mEq/L    Chloride 101  96 - 112 mEq/L    CO2 24  19 - 32 mEq/L    Glucose, Bld 105 (*) 70 - 99 mg/dL    BUN 6  6 - 23 mg/dL    Creatinine, Ser 1.61  0.50 - 1.10 mg/dL    Calcium 9.3  8.4 - 09.6 mg/dL    GFR calc non Af Amer >90  >90 mL/min    GFR calc Af Amer >90  >90 mL/min   CBC WITH DIFFERENTIAL     Status: Abnormal   Collection Time   11/29/11  9:36 PM      Component Value Range Comment   WBC 12.1 (*) 4.0 - 10.5 K/uL    RBC 3.02 (*) 3.87 - 5.11 MIL/uL    Hemoglobin 10.0 (*) 12.0 - 15.0 g/dL    HCT 04.5 (*) 40.9 - 46.0 %    MCV 95.4  78.0 - 100.0 fL    MCH 33.1  26.0 - 34.0 pg    MCHC 34.7  30.0 - 36.0 g/dL    RDW 81.1 (*) 91.4 - 15.5 %    Platelets 472 (*) 150 - 400 K/uL    Neutrophils Relative 70  43 - 77 %    Lymphocytes Relative 17  12 - 46 %    Monocytes Relative 11  3 - 12 %    Eosinophils Relative 1  0 - 5 %     Basophils Relative 1  0 - 1 %    Band Neutrophils 0  0 - 10 %    Metamyelocytes Relative 0      Myelocytes 0      Promyelocytes Absolute 0      Blasts 0      nRBC 0  0 /100 WBC    Neutro Abs 8.5 (*) 1.7 - 7.7 K/uL    Lymphs Abs 2.1  0.7 - 4.0 K/uL    Monocytes Absolute 1.3 (*) 0.1 - 1.0 K/uL    Eosinophils Absolute 0.1  0.0 - 0.7 K/uL    Basophils Absolute 0.1  0.0 - 0.1 K/uL    RBC Morphology POLYCHROMASIA PRESENT     RETICULOCYTES     Status: Abnormal   Collection Time   11/29/11  9:36 PM      Component Value Range Comment   Retic Ct Pct 18.7 (*) 0.4 - 3.1 %    RBC. 3.02 (*) 3.87 - 5.11 MIL/uL    Retic Count, Manual 564.7 (*) 19.0 - 186.0 K/uL   POCT I-STAT TROPONIN I     Status: Normal   Collection Time   11/29/11  9:50 PM      Component Value Range Comment   Troponin i, poc 0.01  0.00 - 0.08 ng/mL    Comment 3            URINALYSIS, ROUTINE W REFLEX MICROSCOPIC     Status: Abnormal   Collection Time   11/29/11 11:24 PM      Component Value Range Comment   Color, Urine YELLOW  YELLOW    APPearance CLEAR  CLEAR    Specific Gravity, Urine 1.010  1.005 - 1.030    pH 7.5  5.0 - 8.0    Glucose, UA NEGATIVE  NEGATIVE mg/dL    Hgb urine dipstick LARGE (*) NEGATIVE    Bilirubin Urine NEGATIVE  NEGATIVE    Ketones, ur NEGATIVE  NEGATIVE mg/dL    Protein, ur NEGATIVE  NEGATIVE mg/dL    Urobilinogen, UA 4.0 (*)  0.0 - 1.0 mg/dL    Nitrite NEGATIVE  NEGATIVE    Leukocytes, UA NEGATIVE  NEGATIVE   PREGNANCY, URINE     Status: Normal   Collection Time   11/29/11 11:24 PM      Component Value Range Comment   Preg Test, Ur NEGATIVE  NEGATIVE   URINE MICROSCOPIC-ADD ON     Status: Abnormal   Collection Time   11/29/11 11:24 PM      Component Value Range Comment   Squamous Epithelial / LPF FEW (*) RARE    WBC, UA 0-2  <3 WBC/hpf    RBC / HPF 0-2  <3 RBC/hpf    Bacteria, UA RARE  RARE   CARDIAC PANEL(CRET KIN+CKTOT+MB+TROPI)     Status: Abnormal   Collection Time   11/30/11  3:09 AM       Component Value Range Comment   Total CK 477 (*) 7 - 177 U/L    CK, MB 2.0  0.3 - 4.0 ng/mL    Troponin I <0.30  <0.30 ng/mL    Relative Index 0.4  0.0 - 2.5   COMPREHENSIVE METABOLIC PANEL     Status: Abnormal   Collection Time   11/30/11  3:09 AM      Component Value Range Comment   Sodium 141  135 - 145 mEq/L    Potassium 3.8  3.5 - 5.1 mEq/L    Chloride 107  96 - 112 mEq/L    CO2 24  19 - 32 mEq/L    Glucose, Bld 122 (*) 70 - 99 mg/dL    BUN 6  6 - 23 mg/dL    Creatinine, Ser 1.61  0.50 - 1.10 mg/dL    Calcium 8.5  8.4 - 09.6 mg/dL    Total Protein 6.3  6.0 - 8.3 g/dL    Albumin 3.2 (*) 3.5 - 5.2 g/dL    AST 44 (*) 0 - 37 U/L    ALT 44 (*) 0 - 35 U/L    Alkaline Phosphatase 63  39 - 117 U/L    Total Bilirubin 1.1  0.3 - 1.2 mg/dL    GFR calc non Af Amer >90  >90 mL/min    GFR calc Af Amer >90  >90 mL/min   CBC WITH DIFFERENTIAL     Status: Abnormal (Preliminary result)   Collection Time   11/30/11  3:09 AM      Component Value Range Comment   WBC 11.0 (*) 4.0 - 10.5 K/uL    RBC 2.65 (*) 3.87 - 5.11 MIL/uL    Hemoglobin 8.7 (*) 12.0 - 15.0 g/dL    HCT 04.5 (*) 40.9 - 46.0 %    MCV 95.1  78.0 - 100.0 fL    MCH 32.8  26.0 - 34.0 pg    MCHC 34.5  30.0 - 36.0 g/dL    RDW 81.1 (*) 91.4 - 15.5 %    Platelets 410 (*) 150 - 400 K/uL    Neutrophils Relative PENDING  43 - 77 %    Neutro Abs PENDING  1.7 - 7.7 K/uL    Band Neutrophils PENDING  0 - 10 %    Lymphocytes Relative PENDING  12 - 46 %    Lymphs Abs PENDING  0.7 - 4.0 K/uL    Monocytes Relative PENDING  3 - 12 %    Monocytes Absolute PENDING  0.1 - 1.0 K/uL    Eosinophils Relative PENDING  0 - 5 %  Eosinophils Absolute PENDING  0.0 - 0.7 K/uL    Basophils Relative PENDING  0 - 1 %    Basophils Absolute PENDING  0.0 - 0.1 K/uL    WBC Morphology PENDING      RBC Morphology PENDING      Smear Review PENDING      nRBC PENDING  0 /100 WBC    Metamyelocytes Relative PENDING      Myelocytes PENDING       Promyelocytes Absolute PENDING      Blasts PENDING      Dg Chest 2 View  11/29/2011  *RADIOLOGY REPORT*  Clinical Data: Cough, shortness of breath  CHEST - 2 VIEW  Comparison: 11/21/2011  Findings: Lung volumes are low with crowding of the bronchovascular markings.  Heart size upper limits of normal.  Mild central bronchial wall thickening noted.  Biconcave vertebral bodies have appearance typical for sickle cell disease.  No pleural effusion. No acute osseous finding. Sclerosis in the humeral heads bilaterally is also compatible with sickle cell disease.  IMPRESSION: Central bronchial wall thickening which may indicate bronchitis.  Biconcave vertebral body deformities compatible with sickle cell disease.  Original Report Authenticated By: Harrel Lemon, M.D.   Ct Angio Chest W/cm &/or Wo Cm  11/30/2011  *RADIOLOGY REPORT*  Clinical Data: Back pain.  CT ANGIOGRAPHY CHEST  Technique:  Multidetector CT imaging of the chest using the standard protocol during bolus administration of intravenous contrast. Multiplanar reconstructed images including MIPs were obtained and reviewed to evaluate the vascular anatomy.  Contrast: OMNIPAQUE IOHEXOL 350 MG/ML SOLN  Comparison: None.  Findings: Initial injection resulted in poor contrast bolus requiring re-injection for diagnostic quality images.  Subsequent images demonstrate adequate study technically with moderately good opacification of the central and segmental pulmonary arteries.  No focal filling defects are identified.  No evidence of significant pulmonary embolus.  Normal heart size.  Normal caliber thoracic aorta.  Visualized portions of the upper abdominal organs are unremarkable.  The esophagus is decompressed.  No significant lymphadenopathy in the chest.  No pleural effusion.  Respiratory motion artifact and dependent atelectatic changes in the lungs.  No focal airspace consolidation or interstitial changes.  No pneumothorax.  Vague indeterminate  nodule in the left lower lung measures about 4 mm diameter and is likely to be benign. Normal alignment of the thoracic vertebrae.  Biconcave appearance of the thoracic vertebra with increased density suggesting sickle cell changes.  IMPRESSION: No evidence of significant pulmonary embolus.  Original Report Authenticated By: Marlon Pel, M.D.    Review of Systems  Constitutional: Negative.   HENT: Negative.   Eyes: Negative.   Respiratory: Negative.   Cardiovascular: Positive for chest pain.  Gastrointestinal: Negative.   Genitourinary: Negative.   Musculoskeletal: Positive for back pain and joint pain.  Skin: Negative.   Neurological: Negative.   Endo/Heme/Allergies: Negative.   Psychiatric/Behavioral: Negative.     Blood pressure 225/62, pulse 95, temperature 99.2 F (37.3 C), temperature source Oral, resp. rate 18, height 5\' 3"  (1.6 m), weight 89.54 kg (197 lb 6.4 oz), last menstrual period 11/29/2011, SpO2 100.00%, not currently breastfeeding. Physical Exam  Constitutional: She is oriented to person, place, and time. She appears well-developed and well-nourished. No distress.  HENT:  Head: Normocephalic and atraumatic.  Right Ear: External ear normal.  Left Ear: External ear normal.  Nose: Nose normal.  Mouth/Throat: Oropharynx is clear and moist. No oropharyngeal exudate.  Eyes: Conjunctivae are normal. Pupils are equal, round, and  reactive to light. Right eye exhibits no discharge. Left eye exhibits no discharge. No scleral icterus.  Neck: Normal range of motion. Neck supple.  Cardiovascular: Normal rate and regular rhythm.   Respiratory: Effort normal and breath sounds normal. No respiratory distress. She has no wheezes. She has no rales.  GI: Soft. Bowel sounds are normal.  Musculoskeletal: Normal range of motion. She exhibits no edema and no tenderness.  Neurological: She is alert and oriented to person, place, and time.       Moves all extremities.  Skin: Skin is  warm and dry. She is not diaphoretic.  Psychiatric: Her behavior is normal.     Assessment/Plan #1. Sickle cell pain crisis - patient will be continued on pain relief medications, I have ordered Dilaudid IV when necessary breakthrough pain along with her scheduled pain medications. Continue with hydration. Patient did complain of chest pain pleuritic in nature. Patient is not hypoxic and CT chest does not show any features of lung infiltrates. #2. Sickle cell anemia. #3. Recently diagnosed bacterial vaginosis on clindamycin.  Patient is admitted under Dr. August Saucer service. CODE STATUS - full code.   Dr.Kodi Guerrera 11/30/2011, 4:35 AM

## 2011-11-30 NOTE — Progress Notes (Signed)
I cosign for Karen Wall, RN's assessment, med administration, notes, I&O, and care plan/education.  

## 2011-11-30 NOTE — ED Provider Notes (Signed)
History     CSN: [id number]  Arrival date & time 11/29/11  2019   First MD Initiated Contact with Patient 11/29/11 2124      Chief Complaint  Patient presents with  . Sickle Cell Pain Crisis  . Fever    (Consider location/radiation/quality/duration/timing/severity/associated sxs/prior treatment) HPI Patient presents emergency department with sickle cell crisis and pain.  Patient, states that she was discharged today from the sickle cell unit and thought she was feeling better, but then noted.  As the day went on she started having increasing pain.  Patient denies, fevers, at home.  Patient denies, nausea, vomiting, abdominal pain, back pain, weakness, visual changes, or headache.  Patient, states she is having discomfort in her chest, right groin, neck and back.  Patient, states this is typical for her sickle cell crisis. Past Medical History  Diagnosis Date  . H/O: 1 miscarriage 03/22/2011  . 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 Depression 01/06/2011  . GERD (gastroesophageal reflux disease) 02/17/2011  . Trichotillomania     h/o  . Blood transfusion     "lots"  . Sickle cell anemia with crisis   . Exertional dyspnea     "sometimes"  . Sickle cell anemia   . Headache   . Migraines 11/08/11    "@ least twice/month"  . Chronic back pain     "very severe; have knot in my back; from tight muscle; take RX and exercise for it"  . Mood swings 11/08/11    "I go back and forth; real bad"    Past Surgical History  Procedure Date  . Cholecystectomy 05/2010  . Dilation and curettage of uterus 02/20/11    S/P miscarriage    History reviewed. No pertinent family history.  History  Substance Use Topics  . Smoking status: Never Smoker   . Smokeless tobacco: Never Used  . Alcohol Use: No    OB History    Grav Para Term Preterm Abortions TAB SAB Ect Mult Living   1    1           Review of Systems All other systems negative except as documented in the HPI. All pertinent positives and  negatives as reviewed in the HPI.  Allergies  Toradol  Home Medications   Current Outpatient Rx  Name Route Sig Dispense Refill  . ALPRAZOLAM 0.25 MG PO TABS Oral Take 1-2 tablets (0.25-0.5 mg total) by mouth every 3 (three) hours as needed for anxiety. 60 tablet 1  . VITAMIN D 1000 UNITS PO TABS Oral Take 1,000 Units by mouth daily.    Marland Kitchen CLINDAMYCIN HCL 300 MG PO CAPS Oral Take 1 capsule (300 mg total) by mouth every 12 (twelve) hours. 20 capsule 0  . CYCLOBENZAPRINE HCL 5 MG PO TABS Oral Take 5 mg by mouth 2 (two) times daily as needed. muscle spasms.    Marland Kitchen FLUVOXAMINE MALEATE 50 MG PO TABS Oral Take 1 tablet (50 mg total) by mouth at bedtime. 30 tablet 1  . HYDROXYUREA 500 MG PO CAPS Oral Take 1,000 mg by mouth daily. May take with food to minimize GI side effects.    Marland Kitchen METOPROLOL SUCCINATE 12.5 MG HALF TABLET Oral Take 0.5 tablets (12.5 mg total) by mouth daily. 60 tablet 0  . OXYCODONE HCL 10 MG PO TABS Oral Take 1 tablet (10 mg total) by mouth 4 (four) times daily as needed. For pain. 60 tablet 0    BP 113/52  Pulse 106  Temp 98.8 F (37.1  C) (Oral)  Resp 14  SpO2 96%  LMP 11/29/2011  Breastfeeding? No  Physical Exam  Nursing note and vitals reviewed. Constitutional: She is oriented to person, place, and time. She appears well-developed and well-nourished. No distress.  HENT:  Head: Normocephalic and atraumatic.  Mouth/Throat: Oropharynx is clear and moist.  Eyes: Pupils are equal, round, and reactive to light.  Neck: Normal range of motion. Neck supple.  Cardiovascular: Normal rate, regular rhythm and normal heart sounds.   Pulmonary/Chest: Effort normal and breath sounds normal. No respiratory distress. She has no wheezes. She has no rales.  Abdominal: Soft. Bowel sounds are normal. She exhibits no distension. There is no tenderness. There is no rebound and no guarding.  Neurological: She is alert and oriented to person, place, and time. Coordination normal.  Skin: Skin  is warm and dry. No rash noted.    ED Course  Procedures (including critical care time)  Labs Reviewed  BASIC METABOLIC PANEL - Abnormal; Notable for the following:    Glucose, Bld 105 (*)     All other components within normal limits  CBC WITH DIFFERENTIAL - Abnormal; Notable for the following:    WBC 12.1 (*)     RBC 3.02 (*)     Hemoglobin 10.0 (*)     HCT 28.8 (*)     RDW 19.0 (*)     Platelets 472 (*)     Neutro Abs 8.5 (*)     Monocytes Absolute 1.3 (*)     All other components within normal limits  RETICULOCYTES - Abnormal; Notable for the following:    Retic Ct Pct 18.7 (*)     RBC. 3.02 (*)     Retic Count, Manual 564.7 (*)     All other components within normal limits  URINALYSIS, ROUTINE W REFLEX MICROSCOPIC - Abnormal; Notable for the following:    Hgb urine dipstick LARGE (*)     Urobilinogen, UA 4.0 (*)     All other components within normal limits  URINE MICROSCOPIC-ADD ON - Abnormal; Notable for the following:    Squamous Epithelial / LPF FEW (*)     All other components within normal limits  PREGNANCY, URINE  POCT I-STAT TROPONIN I  URINE CULTURE   Dg Chest 2 View  11/29/2011  *RADIOLOGY REPORT*  Clinical Data: Cough, shortness of breath  CHEST - 2 VIEW  Comparison: 11/21/2011  Findings: Lung volumes are low with crowding of the bronchovascular markings.  Heart size upper limits of normal.  Mild central bronchial wall thickening noted.  Biconcave vertebral bodies have appearance typical for sickle cell disease.  No pleural effusion. No acute osseous finding. Sclerosis in the humeral heads bilaterally is also compatible with sickle cell disease.  IMPRESSION: Central bronchial wall thickening which may indicate bronchitis.  Biconcave vertebral body deformities compatible with sickle cell disease.  Original Report Authenticated By: Harrel Lemon, M.D.    I rechecked the patient at 1:05 AM, and she still continues to have pain.  She feels like that these  rounds of pain medication have not helped her condition.  Patient, feels she will need admission to the hospital.    MDM  MDM Reviewed: nursing note, vitals and previous chart Interpretation: labs and x-ray Consults: admitting MD            Carlyle Dolly, PA-C 11/30/11 0133

## 2011-11-30 NOTE — ED Provider Notes (Signed)
Medical screening examination/treatment/procedure(s) were performed by non-physician practitioner and as supervising physician I was immediately available for consultation/collaboration.   Laray Anger, DO 11/30/11 1531

## 2011-12-01 LAB — WOUND CULTURE: Special Requests: NORMAL

## 2011-12-01 MED ORDER — DEXTROSE-NACL 5-0.45 % IV SOLN
INTRAVENOUS | Status: DC
  Administered 2011-12-01 – 2011-12-02 (×4): via INTRAVENOUS

## 2011-12-01 MED ORDER — HYDROMORPHONE HCL PF 1 MG/ML IJ SOLN
2.0000 mg | INTRAMUSCULAR | Status: DC | PRN
  Administered 2011-12-01 – 2011-12-02 (×9): 2 mg via INTRAVENOUS
  Filled 2011-12-01 (×9): qty 2

## 2011-12-01 NOTE — Progress Notes (Signed)
Subjective:  Patient reports she's feeling better overall. She still has right hip and leg pain though this is decreasing. She rates her present pain as a 6/10. There's been no chest pains or shortness of breath presently. She is beginning to ambulate more. Feels she may be the to go home by tomorrow afternoon.   Allergies  Allergen Reactions  . Toradol (Ketorolac Tromethamine) Hives and Itching   Current Facility-Administered Medications  Medication Dose Route Frequency Provider Last Rate Last Dose  . acetaminophen (TYLENOL) tablet 650 mg  650 mg Oral Q6H PRN Eduard Clos, MD       Or  . acetaminophen (TYLENOL) suppository 650 mg  650 mg Rectal Q6H PRN Eduard Clos, MD      . ALPRAZolam Prudy Feeler) tablet 0.25-0.5 mg  0.25-0.5 mg Oral Q3H PRN Eduard Clos, MD      . clindamycin (CLEOCIN) capsule 300 mg  300 mg Oral Q12H Eduard Clos, MD   300 mg at 12/01/11 1647  . cyclobenzaprine (FLEXERIL) tablet 5 mg  5 mg Oral BID PRN Eduard Clos, MD   5 mg at 12/01/11 1303  . dextrose 5 %-0.45 % sodium chloride infusion   Intravenous Continuous Grayce Sessions, NP 75 mL/hr at 12/01/11 2112    . fluvoxaMINE (LUVOX) tablet 50 mg  50 mg Oral QHS Eduard Clos, MD   50 mg at 12/01/11 2112  . HYDROmorphone (DILAUDID) injection 2 mg  2 mg Intravenous Q2H PRN Gwenyth Bender, MD   2 mg at 12/01/11 2222  . hydroxyurea (HYDREA) capsule 1,000 mg  1,000 mg Oral Daily Eduard Clos, MD   1,000 mg at 12/01/11 0943  . metoprolol succinate (TOPROL-XL) 24 hr tablet 12.5 mg  12.5 mg Oral Daily Eduard Clos, MD   12.5 mg at 12/01/11 0943  . ondansetron (ZOFRAN) tablet 4 mg  4 mg Oral Q6H PRN Eduard Clos, MD       Or  . ondansetron [facility]) injection 4 mg  4 mg Intravenous Q6H PRN Eduard Clos, MD   4 mg at 12/01/11 0114  . oxyCODONE (OXYCONTIN) 12 hr tablet 20 mg  20 mg Oral Q12H Eduard Clos, MD   20 mg at 12/01/11 2112  . sodium chloride 0.9 %  injection 3 mL  3 mL Intravenous Q12H Eduard Clos, MD   3 mL at 12/01/11 2115  . DISCONTD: 0.9 %  sodium chloride infusion   Intravenous Continuous Eduard Clos, MD 125 mL/hr at 12/01/11 0533    . DISCONTD: HYDROmorphone (DILAUDID) injection 2 mg  2 mg Intravenous Q3H PRN Gwenyth Bender, MD   2 mg at 12/01/11 0943    Objective: Blood pressure 97/50, pulse 90, temperature 98 F (36.7 C), temperature source Oral, resp. rate 18, height 5\' 3"  (1.6 m), weight 193 lb 1.6 oz (87.59 kg), last menstrual period 11/29/2011, SpO2 93.00%, not currently breastfeeding.  Well-developed well-nourished overweight black female in no acute distress. HEENT:no sinus tenderness. NECK:no enlarged thyroid. No posterior cervical nodes. LUNGS:clear to auscultation. YN:WGNFAO S1, S2 without S3. ABDOMEN: Soft, nontender. MSK: Minimal tenderness in the right hip region. Negative Homans. NEUROLOGIC: Nonfocal  LABORATORY data reviewed.  Studies/Results: Ct Angio Chest W/cm &/or Wo Cm  11/30/2011  *RADIOLOGY REPORT*  Clinical Data: Back pain.  CT ANGIOGRAPHY CHEST  Technique:  Multidetector CT imaging of the chest using the standard protocol during bolus administration of intravenous contrast. Multiplanar reconstructed images including MIPs  were obtained and reviewed to evaluate the vascular anatomy.  Contrast: OMNIPAQUE IOHEXOL 350 MG/ML SOLN  Comparison: None.  Findings: Initial injection resulted in poor contrast bolus requiring re-injection for diagnostic quality images.  Subsequent images demonstrate adequate study technically with moderately good opacification of the central and segmental pulmonary arteries.  No focal filling defects are identified.  No evidence of significant pulmonary embolus.  Normal heart size.  Normal caliber thoracic aorta.  Visualized portions of the upper abdominal organs are unremarkable.  The esophagus is decompressed.  No significant lymphadenopathy in the chest.  No pleural  effusion.  Respiratory motion artifact and dependent atelectatic changes in the lungs.  No focal airspace consolidation or interstitial changes.  No pneumothorax.  Vague indeterminate nodule in the left lower lung measures about 4 mm diameter and is likely to be benign. Normal alignment of the thoracic vertebrae.  Biconcave appearance of the thoracic vertebra with increased density suggesting sickle cell changes.  IMPRESSION: No evidence of significant pulmonary embolus.  Original Report Authenticated By: Marlon Pel, M.D.    Patient Active Problem List  Diagnosis  . SICKLE CELL ANEMIA  . TRICHOTILLOMANIA  . Active smoker  . Back pain  . Depression  . GERD (gastroesophageal reflux disease)  . Contraception management  . Stress  . Overweight  . Vaso-occlusive sickle cell crisis  . Right thigh pain  . Vaginosis  . 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 Anxiety disorder. Elevated CPK.   Plan: Anticipate discharge home tomorrow. Followup CBC and electrolytes. Followup CPK the ensure trending downward.   August Saucer, Myisha Pickerel 12/01/2011 10:53 PM

## 2011-12-02 LAB — CBC
HCT: 27.2 % — ABNORMAL LOW (ref 36.0–46.0)
MCH: 32.1 pg (ref 26.0–34.0)
MCHC: 34.2 g/dL (ref 30.0–36.0)
RDW: 16.7 % — ABNORMAL HIGH (ref 11.5–15.5)

## 2011-12-02 LAB — CK: Total CK: 449 U/L — ABNORMAL HIGH (ref 7–177)

## 2011-12-02 LAB — COMPREHENSIVE METABOLIC PANEL
Albumin: 3.4 g/dL — ABNORMAL LOW (ref 3.5–5.2)
BUN: 8 mg/dL (ref 6–23)
Calcium: 9.2 mg/dL (ref 8.4–10.5)
Creatinine, Ser: 0.69 mg/dL (ref 0.50–1.10)
GFR calc Af Amer: >90 mL/min (ref >90)
Potassium: 3.7 mEq/L (ref 3.5–5.1)
Total Protein: 6.8 g/dL (ref 6.0–8.3)

## 2011-12-02 MED ORDER — HYDROMORPHONE HCL PF 1 MG/ML IJ SOLN
1.0000 mg | INTRAMUSCULAR | Status: DC | PRN
Start: 2011-12-02 — End: 2011-12-03
  Administered 2011-12-02 – 2011-12-03 (×3): 1 mg via INTRAVENOUS
  Filled 2011-12-02 (×3): qty 1

## 2011-12-02 NOTE — Progress Notes (Signed)
[name] to be D/C'd Home per MD order.  Discussed with the patient and all questions fully answered.   Aneisa, Karren D  Home Medication Instructions ZOX:[id number]   Printed on:12/02/11 1836  Medication Information                    cholecalciferol (VITAMIN D) 1000 UNITS tablet Take 1,000 Units by mouth daily.           hydroxyurea (HYDREA) 500 MG capsule Take 1,000 mg by mouth daily. May take with food to minimize GI side effects.           Oxycodone HCl 10 MG TABS Take 1 tablet (10 mg total) by mouth 4 (four) times daily as needed. For pain.           fluvoxaMINE (LUVOX) 50 MG tablet Take 1 tablet (50 mg total) by mouth at bedtime.           clindamycin (CLEOCIN) 300 MG capsule Take 1 capsule (300 mg total) by mouth every 12 (twelve) hours.           ALPRAZolam (XANAX) 0.25 MG tablet Take 1-2 tablets (0.25-0.5 mg total) by mouth every 3 (three) hours as needed for anxiety.           metoprolol succinate (TOPROL-XL) 12.5 mg TB24 Take 0.5 tablets (12.5 mg total) by mouth daily.           cyclobenzaprine (FLEXERIL) 5 MG tablet Take 5 mg by mouth 2 (two) times daily as needed. muscle spasms.             VVS, Skin clean, dry and intact without evidence of skin break down, no evidence of skin tears noted. IV catheter discontinued intact. Site without signs and symptoms of complications. Dressing and pressure applied.  An After Visit Summary was printed and given to the patient. Patient escorted via WC, and D/C home via private auto with family.  Milta Deiters 12/02/2011 6:36 PM

## 2011-12-02 NOTE — Progress Notes (Signed)
Subjective:  Patient complaining that she still is not able to manage her pain at home. She was scheduled to go home today. She states as a day progressed her pain increased. She rates the pain as a 6/10. She denies chest pains or shortness of breath. Patient with several psychosocial issues which has impacted her hospital stay. He presented denies that this has a factor to her going home at this time. Her vital signs otherwise stable.   Allergies  Allergen Reactions  . Toradol (Ketorolac Tromethamine) Hives and Itching   Current Facility-Administered Medications  Medication Dose Route Frequency Provider Last Rate Last Dose  . acetaminophen (TYLENOL) tablet 650 mg  650 mg Oral Q6H PRN Eduard Clos, MD       Or  . acetaminophen (TYLENOL) suppository 650 mg  650 mg Rectal Q6H PRN Eduard Clos, MD      . ALPRAZolam Prudy Feeler) tablet 0.25-0.5 mg  0.25-0.5 mg Oral Q3H PRN Eduard Clos, MD      . clindamycin (CLEOCIN) capsule 300 mg  300 mg Oral Q12H Eduard Clos, MD   300 mg at 12/02/11 1709  . cyclobenzaprine (FLEXERIL) tablet 5 mg  5 mg Oral BID PRN Eduard Clos, MD   5 mg at 12/02/11 2053  . dextrose 5 %-0.45 % sodium chloride infusion   Intravenous Continuous Gwenyth Bender, MD 50 mL/hr at 12/02/11 1722    . fluvoxaMINE (LUVOX) tablet 50 mg  50 mg Oral QHS Eduard Clos, MD   50 mg at 12/02/11 2148  . HYDROmorphone (DILAUDID) injection 1 mg  1 mg Intravenous Q4H PRN Gwenyth Bender, MD   1 mg at 12/02/11 2028  . hydroxyurea (HYDREA) capsule 1,000 mg  1,000 mg Oral Daily Eduard Clos, MD   1,000 mg at 12/02/11 0930  . metoprolol succinate (TOPROL-XL) 24 hr tablet 12.5 mg  12.5 mg Oral Daily Eduard Clos, MD   12.5 mg at 12/02/11 0930  . ondansetron (ZOFRAN) tablet 4 mg  4 mg Oral Q6H PRN Eduard Clos, MD       Or  . ondansetron [facility]) injection 4 mg  4 mg Intravenous Q6H PRN Eduard Clos, MD   4 mg at 12/01/11 0114  . oxyCODONE  (OXYCONTIN) 12 hr tablet 20 mg  20 mg Oral Q12H Eduard Clos, MD   20 mg at 12/02/11 2148  . sodium chloride 0.9 % injection 3 mL  3 mL Intravenous Q12H Eduard Clos, MD   3 mL at 12/02/11 2151  . DISCONTD: HYDROmorphone (DILAUDID) injection 2 mg  2 mg Intravenous Q2H PRN Gwenyth Bender, MD   2 mg at 12/02/11 1710    Objective: Blood pressure 120/61, pulse 92, temperature 97.4 F (36.3 C), temperature source Oral, resp. rate 17, height 5\' 3"  (1.6 m), weight 193 lb 1.6 oz (87.59 kg), last menstrual period 11/29/2011, SpO2 100.00%, not currently breastfeeding.  Well-developed well-nourished black female in no acute distress. Mildly depressed mood. HEENT:no sinus tenderness. No sclera icterus. NECK:no enlarged thyroid. LUNGS:clear to auscultation. ZO:XWRUEA S1, S2 without S3. ABD:no tenderness. VWU:JWJXBJY tenderness in lower lumbar sacral spine and right hip. Negative Homans. NEURO:intact.  Lab results: Results for orders placed during the hospital encounter of 11/29/11 (from the past 48 hour(s))  CBC     Status: Abnormal   Collection Time   12/02/11  5:29 AM      Component Value Range Comment   WBC 12.3 (*) 4.0 -  10.5 K/uL    RBC 2.90 (*) 3.87 - 5.11 MIL/uL    Hemoglobin 9.3 (*) 12.0 - 15.0 g/dL    HCT 40.9 (*) 81.1 - 46.0 %    MCV 93.8  78.0 - 100.0 fL    MCH 32.1  26.0 - 34.0 pg    MCHC 34.2  30.0 - 36.0 g/dL    RDW 91.4 (*) 78.2 - 15.5 %    Platelets 383  150 - 400 K/uL   COMPREHENSIVE METABOLIC PANEL     Status: Abnormal   Collection Time   12/02/11  5:29 AM      Component Value Range Comment   Sodium 138  135 - 145 mEq/L    Potassium 3.7  3.5 - 5.1 mEq/L    Chloride 103  96 - 112 mEq/L    CO2 25  19 - 32 mEq/L    Glucose, Bld 105 (*) 70 - 99 mg/dL    BUN 8  6 - 23 mg/dL    Creatinine, Ser 9.56  0.50 - 1.10 mg/dL    Calcium 9.2  8.4 - 21.3 mg/dL    Total Protein 6.8  6.0 - 8.3 g/dL    Albumin 3.4 (*) 3.5 - 5.2 g/dL    AST 45 (*) 0 - 37 U/L    ALT 43 (*) 0 -  35 U/L    Alkaline Phosphatase 66  39 - 117 U/L    Total Bilirubin 1.3 (*) 0.3 - 1.2 mg/dL    GFR calc non Af Amer >90  >90 mL/min    GFR calc Af Amer >90  >90 mL/min   CK     Status: Abnormal   Collection Time   12/02/11  5:29 AM      Component Value Range Comment   Total CK 449 (*) 7 - 177 U/L     Studies/Results: No results found.  Patient Active Problem List  Diagnosis  . SICKLE CELL ANEMIA  . TRICHOTILLOMANIA  . Active smoker  . Back pain  . Depression  . GERD (gastroesophageal reflux disease)  . Contraception management  . Stress  . Overweight  . Vaso-occlusive sickle cell crisis  . Right thigh pain  . Vaginosis  . Tachycardia    Impression: Resolving sickle cell crises. Elevated CPK. Rule out mild myositis. This is slowly decreasing. 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 Gastroesophageal reflux disease. Presently not on a PPI agent. Anxiety disorder with situational stress. History tobacco abuse. Presently abstaining.   Plan: Protonix 40 mg q.a.m. Progress to discharge. Followup as an outpatient. Continue outpatient counseling.   August Saucer, Phares Zaccone 12/02/2011 11:48 PM

## 2011-12-03 LAB — URINE CULTURE

## 2011-12-03 MED ORDER — PANTOPRAZOLE SODIUM 40 MG PO TBEC
40.0000 mg | DELAYED_RELEASE_TABLET | Freq: Every day | ORAL | Status: DC
  Administered 2011-12-03: 40 mg via ORAL
  Filled 2011-12-03: qty 1

## 2011-12-03 MED ORDER — PROMETHAZINE HCL 25 MG PO TABS: 25.0000 mg | ORAL_TABLET | ORAL | Status: DC | PRN

## 2011-12-03 NOTE — Discharge Summary (Signed)
Discharge Summary  Patient ID: [name] MRN: [id number] DOB/AGE: 06-14-1990 21 y.o.  Admit date: 11/29/2011 Discharge date: 12/03/2011   Discharge Diagnoses:   Sickle cell crisis. Right hip pain secondary to sickle osteopathy. Mild myositis with elevated CPK, post-traumatic. Anxiety disorder. GERD  Discharged Condition: improved.  Operations/Procedues: none   Hospital Course: Patient readmitted for further control of her pain. She war restarted on IV fluids with IV Dilaudid, IV zofran and benadryl as needed. No evidence for infection found. Her multiple psychosocial issues which contributed to her return was reviewed. With continued supportive measures, her pain level decreased. She complained of nausea which was treated with Protonix for previously diagnosed GERD. She is discharge today in medically stable condition.   Significant Diagnostic Studies: none.   Disposition: 01-Home or Self Care  Discharge Orders    Future Orders Please Complete By Expires   Diet - low sodium heart healthy      Diet - low sodium heart healthy      Increase activity slowly      No wound care      Call MD for:  severe uncontrolled pain      Discharge instructions      Comments:   Follow up with primary care physician in two weeks.   Increase activity slowly      No wound care      Call MD for:  severe uncontrolled pain      Discharge instructions      Comments:   Follow up with primary care physician in one week.     Medication List  As of 12/03/2011 11:34 AM   TAKE these medications         ALPRAZolam 0.25 MG tablet   Commonly known as: XANAX   Take 1-2 tablets (0.25-0.5 mg total) by mouth every 3 (three) hours as needed for anxiety.      cholecalciferol 1000 UNITS tablet   Commonly known as: VITAMIN D   Take 1,000 Units by mouth daily.      clindamycin 300 MG capsule   Commonly known as: CLEOCIN   Take 1 capsule (300 mg total) by mouth every 12 (twelve) hours.     cyclobenzaprine 5 MG tablet   Commonly known as: FLEXERIL   Take 5 mg by mouth 2 (two) times daily as needed. muscle spasms.      fluvoxaMINE 50 MG tablet   Commonly known as: LUVOX   Take 1 tablet (50 mg total) by mouth at bedtime.      hydroxyurea 500 MG capsule   Commonly known as: HYDREA   Take 1,000 mg by mouth daily. May take with food to minimize GI side effects.      metoprolol succinate 12.5 mg Tb24   Commonly known as: TOPROL-XL   Take 0.5 tablets (12.5 mg total) by mouth daily.      Oxycodone HCl 10 MG Tabs   Take 1 tablet (10 mg total) by mouth 4 (four) times daily as needed. For pain.      promethazine 25 MG tablet   Commonly known as: PHENERGAN   Take 1 tablet (25 mg total) by mouth every 4 (four) hours as needed for nausea.           Discharge time greater than 35 minutes.  SignedAugust Saucer, Neela Zecca 12/03/2011, 11:34 AM

## 2011-12-04 ENCOUNTER — Other Ambulatory Visit (HOSPITAL_COMMUNITY): Payer: Self-pay | Admitting: Family Medicine

## 2011-12-06 ENCOUNTER — Emergency Department (HOSPITAL_COMMUNITY)
Admission: EM | Admit: 2011-12-06 | Discharge: 2011-12-06 | Disposition: A | Discharge status: Home / Self Care | Payer: Medicaid Other | Attending: Emergency Medicine | Admitting: Emergency Medicine

## 2011-12-06 ENCOUNTER — Encounter (HOSPITAL_COMMUNITY): Payer: Self-pay | Admitting: *Deleted

## 2011-12-06 DIAGNOSIS — G8929 Other chronic pain: Secondary | ICD-10-CM | POA: Insufficient documentation

## 2011-12-06 DIAGNOSIS — E669 Obesity, unspecified: Secondary | ICD-10-CM | POA: Insufficient documentation

## 2011-12-06 DIAGNOSIS — Z9089 Acquired absence of other organs: Secondary | ICD-10-CM | POA: Insufficient documentation

## 2011-12-06 DIAGNOSIS — Z79899 Other long term (current) drug therapy: Secondary | ICD-10-CM | POA: Insufficient documentation

## 2011-12-06 DIAGNOSIS — K219 Gastro-esophageal reflux disease without esophagitis: Secondary | ICD-10-CM | POA: Insufficient documentation

## 2011-12-06 DIAGNOSIS — D5700 Hb-SS disease with crisis, unspecified: Secondary | ICD-10-CM

## 2011-12-06 LAB — CBC WITH DIFFERENTIAL/PLATELET
Basophils Relative: 0 % (ref 0–1)
HCT: 29.5 % — ABNORMAL LOW (ref 36.0–46.0)
Hemoglobin: 10.2 g/dL — ABNORMAL LOW (ref 12.0–15.0)
Lymphocytes Relative: 14 % (ref 12–46)
Lymphs Abs: 2.4 10*3/uL (ref 0.7–4.0)
Monocytes Absolute: 1.6 10*3/uL — ABNORMAL HIGH (ref 0.1–1.0)
Monocytes Relative: 10 % (ref 3–12)
Neutro Abs: 12.1 10*3/uL — ABNORMAL HIGH (ref 1.7–7.7)
Neutrophils Relative %: 74 % (ref 43–77)
RBC: 3.17 MIL/uL — ABNORMAL LOW (ref 3.87–5.11)
WBC: 16.4 10*3/uL — ABNORMAL HIGH (ref 4.0–10.5)

## 2011-12-06 LAB — URINALYSIS, ROUTINE W REFLEX MICROSCOPIC
Bilirubin Urine: NEGATIVE
Glucose, UA: NEGATIVE mg/dL
Hgb urine dipstick: NEGATIVE
Protein, ur: NEGATIVE mg/dL

## 2011-12-06 LAB — COMPREHENSIVE METABOLIC PANEL
Albumin: 4 g/dL (ref 3.5–5.2)
Alkaline Phosphatase: 65 U/L (ref 39–117)
BUN: 9 mg/dL (ref 6–23)
CO2: 21 mEq/L (ref 19–32)
Chloride: 105 mEq/L (ref 96–112)
Creatinine, Ser: 0.55 mg/dL (ref 0.50–1.10)
GFR calc non Af Amer: >90 mL/min (ref >90)
Glucose, Bld: 100 mg/dL — ABNORMAL HIGH (ref 70–99)
Potassium: 3.6 mEq/L (ref 3.5–5.1)
Total Bilirubin: 1.6 mg/dL — ABNORMAL HIGH (ref 0.3–1.2)

## 2011-12-06 LAB — RETICULOCYTES: Retic Ct Pct: 10.6 % — ABNORMAL HIGH (ref 0.4–3.1)

## 2011-12-06 MED ORDER — HYDROMORPHONE HCL PF 1 MG/ML IJ SOLN
1.0000 mg | Freq: Once | INTRAMUSCULAR | Status: AC
  Administered 2011-12-06: 1 mg via INTRAVENOUS
  Filled 2011-12-06: qty 1

## 2011-12-06 MED ORDER — OXYCODONE-ACETAMINOPHEN 5-325 MG PO TABS: 1.0000 | ORAL_TABLET | Freq: Four times a day (QID) | ORAL | Status: DC | PRN

## 2011-12-06 MED ORDER — SODIUM CHLORIDE 0.9 % IV SOLN
Freq: Once | INTRAVENOUS | Status: AC
  Administered 2011-12-06: 07:00:00 via INTRAVENOUS

## 2011-12-06 MED ORDER — HYDROMORPHONE HCL PF 2 MG/ML IJ SOLN
2.0000 mg | Freq: Once | INTRAMUSCULAR | Status: AC
  Administered 2011-12-06: 2 mg via INTRAVENOUS
  Filled 2011-12-06: qty 1

## 2011-12-06 MED ORDER — SODIUM CHLORIDE 0.9 % IV BOLUS (SEPSIS)
1000.0000 mL | Freq: Once | INTRAVENOUS | Status: AC
  Administered 2011-12-06: 1000 mL via INTRAVENOUS

## 2011-12-06 MED ORDER — ONDANSETRON HCL 8 MG PO TABS: 8.0000 mg | ORAL_TABLET | ORAL | Status: DC | PRN

## 2011-12-06 MED ORDER — ONDANSETRON HCL 4 MG/2ML IJ SOLN
4.0000 mg | Freq: Once | INTRAMUSCULAR | Status: AC
  Administered 2011-12-06: 4 mg via INTRAVENOUS
  Filled 2011-12-06: qty 2

## 2011-12-06 NOTE — ED Provider Notes (Signed)
History     CSN: [id number]  Arrival date & time 12/06/11  6578   First MD Initiated Contact with Patient 12/06/11 [phone]      Chief Complaint  Patient presents with  . Sickle Cell Pain Crisis    (Consider location/radiation/quality/duration/timing/severity/associated sxs/prior treatment) HPI 21 year old female with sickle cell disease presents to the emergency apartment complaining of back and left arm pain. Patient reports she had back pain throughout the day yesterday. Patient reports she took Percocet last night before going to bed. She woke up about an hour and a half ago with worsening pain. Patient denies overdoing it yesterday, denies any dehydration. Patient reports she feels like she is entering a pain crisis. Patient was recently admitted the 17th to the 20th. She denies any fever chills nausea vomiting diarrhea no chest pain or shortness of breath. Past Medical History  Diagnosis Date  . H/O: 1 miscarriage 03/22/2011  . 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 Depression 01/06/2011  . GERD (gastroesophageal reflux disease) 02/17/2011  . Trichotillomania     h/o  . Blood transfusion     "lots"  . Sickle cell anemia with crisis   . Exertional dyspnea     "sometimes"  . Sickle cell anemia   . Headache   . Migraines 11/08/11    "@ least twice/month"  . Chronic back pain     "very severe; have knot in my back; from tight muscle; take RX and exercise for it"  . Mood swings 11/08/11    "I go back and forth; real bad"    Past Surgical History  Procedure Date  . Cholecystectomy 05/2010  . Dilation and curettage of uterus 02/20/11    S/P miscarriage    No family history on file.  History  Substance Use Topics  . Smoking status: Never Smoker   . Smokeless tobacco: Never Used  . Alcohol Use: No    OB History    Grav Para Term Preterm Abortions TAB SAB Ect Mult Living   1    1           Review of Systems  All other systems reviewed and are negative.    Allergies    Toradol  Home Medications   Current Outpatient Rx  Name Route Sig Dispense Refill  . ALPRAZOLAM 0.25 MG PO TABS Oral Take 1-2 tablets (0.25-0.5 mg total) by mouth every 3 (three) hours as needed for anxiety. 60 tablet 1  . VITAMIN D 1000 UNITS PO TABS Oral Take 1,000 Units by mouth daily.    Marland Kitchen CLINDAMYCIN HCL 300 MG PO CAPS Oral Take 1 capsule (300 mg total) by mouth every 12 (twelve) hours. 20 capsule 0  . CYCLOBENZAPRINE HCL 5 MG PO TABS Oral Take 5 mg by mouth 2 (two) times daily as needed. muscle spasms.    Marland Kitchen FLUVOXAMINE MALEATE 50 MG PO TABS Oral Take 1 tablet (50 mg total) by mouth at bedtime. 30 tablet 1  . HYDROXYUREA 500 MG PO CAPS Oral Take 1,000 mg by mouth daily. May take with food to minimize GI side effects.    Marland Kitchen METOPROLOL SUCCINATE 12.5 MG HALF TABLET Oral Take 0.5 tablets (12.5 mg total) by mouth daily. 60 tablet 0  . OXYCODONE HCL 10 MG PO TABS Oral Take 1 tablet (10 mg total) by mouth 4 (four) times daily as needed. For pain. 60 tablet 0  . PROMETHAZINE HCL 25 MG PO TABS Oral Take 1 tablet (25 mg total) by mouth every 4 (four)  hours as needed for nausea. 50 tablet 1  . PROMETHAZINE HCL 25 MG PO TABS Oral Take 1 tablet (25 mg total) by mouth every 6 (six) hours as needed for nausea. 30 tablet 1    BP 123/79  Pulse 98  Temp 97.8 F (36.6 C) (Oral)  Resp 16  SpO2 99%  LMP 11/29/2011  Physical Exam  Nursing note and vitals reviewed. Constitutional: She appears well-developed and well-nourished. She appears distressed (uncomfortable appearing).       Obese  HENT:  Head: Normocephalic and atraumatic.  Nose: Nose normal.  Mouth/Throat: Oropharynx is clear and moist. No oropharyngeal exudate.  Eyes: Conjunctivae and EOM are normal. Pupils are equal, round, and reactive to light.  Neck: Normal range of motion. Neck supple. No JVD present. No tracheal deviation present. No thyromegaly present.  Cardiovascular: Normal rate, regular rhythm, normal heart sounds and intact  distal pulses.  Exam reveals no gallop and no friction rub.   No murmur heard. Pulmonary/Chest: Effort normal and breath sounds normal. No stridor. No respiratory distress. She has no wheezes. She has no rales. She exhibits no tenderness.  Abdominal: Soft. She exhibits no distension and no mass. There is no tenderness. There is no rebound and no guarding.  Musculoskeletal: Normal range of motion. She exhibits no edema and no tenderness.  Lymphadenopathy:    She has no cervical adenopathy.  Skin: Skin is warm and dry. No rash noted. No erythema. No pallor.    ED Course  Procedures (including critical care time)  Labs Reviewed  CBC WITH DIFFERENTIAL - Abnormal; Notable for the following:    WBC 16.4 (*)     RBC 3.17 (*)     Hemoglobin 10.2 (*)     HCT 29.5 (*)     RDW 16.2 (*)     Platelets 491 (*)     Neutro Abs 12.1 (*)     Monocytes Absolute 1.6 (*)     All other components within normal limits  COMPREHENSIVE METABOLIC PANEL - Abnormal; Notable for the following:    Glucose, Bld 100 (*)     Total Bilirubin 1.6 (*)     All other components within normal limits  RETICULOCYTES - Abnormal; Notable for the following:    Retic Ct Pct 10.6 (*)     RBC. 3.17 (*)     Retic Count, Manual 336.0 (*)     All other components within normal limits  URINALYSIS, ROUTINE W REFLEX MICROSCOPIC     1. Vaso-occlusive sickle cell crisis       MDM  21 year old female with sickle cell pain. Patient to receive Dilaudid. Will discuss with sickle cell clinic if they have room for her in her crisis.        Olivia Mackie, MD 12/06/11 [phone]

## 2011-12-06 NOTE — ED Notes (Signed)
Pt stated that she needed more time to urinate.

## 2011-12-06 NOTE — ED Notes (Signed)
Pt c/o sickle cell pain; back and arm pain

## 2011-12-06 NOTE — ED Provider Notes (Signed)
History     CSN: [id number]  Arrival date & time 12/06/11  8295   First MD Initiated Contact with Patient 12/06/11 [phone]      Chief Complaint  Patient presents with  . Sickle Cell Pain Crisis    (Consider location/radiation/quality/duration/timing/severity/associated sxs/prior treatment) HPI  Past Medical History  Diagnosis Date  . H/O: 1 miscarriage 03/22/2011  . 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 Depression 01/06/2011  . GERD (gastroesophageal reflux disease) 02/17/2011  . Trichotillomania     h/o  . Blood transfusion     "lots"  . Sickle cell anemia with crisis   . Exertional dyspnea     "sometimes"  . Sickle cell anemia   . Headache   . Migraines 11/08/11    "@ least twice/month"  . Chronic back pain     "very severe; have knot in my back; from tight muscle; take RX and exercise for it"  . Mood swings 11/08/11    "I go back and forth; real bad"    Past Surgical History  Procedure Date  . Cholecystectomy 05/2010  . Dilation and curettage of uterus 02/20/11    S/P miscarriage    No family history on file.  History  Substance Use Topics  . Smoking status: Never Smoker   . Smokeless tobacco: Never Used  . Alcohol Use: No    OB History    Grav Para Term Preterm Abortions TAB SAB Ect Mult Living   1    1           Review of Systems  Allergies  Toradol  Home Medications   Current Outpatient Rx  Name Route Sig Dispense Refill  . ALPRAZOLAM 0.25 MG PO TABS Oral Take 1-2 tablets (0.25-0.5 mg total) by mouth every 3 (three) hours as needed for anxiety. 60 tablet 1  . VITAMIN D 1000 UNITS PO TABS Oral Take 1,000 Units by mouth daily.    Marland Kitchen CLINDAMYCIN HCL 300 MG PO CAPS Oral Take 1 capsule (300 mg total) by mouth every 12 (twelve) hours. 20 capsule 0  . CYCLOBENZAPRINE HCL 5 MG PO TABS Oral Take 5 mg by mouth 2 (two) times daily as needed. muscle spasms.    Marland Kitchen FLUVOXAMINE MALEATE 50 MG PO TABS Oral Take 1 tablet (50 mg total) by mouth at bedtime. 30 tablet 1  .  HYDROXYUREA 500 MG PO CAPS Oral Take 1,000 mg by mouth daily. May take with food to minimize GI side effects.    Marland Kitchen METOPROLOL SUCCINATE 12.5 MG HALF TABLET Oral Take 0.5 tablets (12.5 mg total) by mouth daily. 60 tablet 0  . OXYCODONE HCL 10 MG PO TABS Oral Take 1 tablet (10 mg total) by mouth 4 (four) times daily as needed. For pain. 60 tablet 0  . PROMETHAZINE HCL 25 MG PO TABS Oral Take 1 tablet (25 mg total) by mouth every 4 (four) hours as needed for nausea. 50 tablet 1  . ONDANSETRON HCL 8 MG PO TABS Oral Take 1 tablet (8 mg total) by mouth every 4 (four) hours as needed for nausea. 10 tablet 0  . OXYCODONE-ACETAMINOPHEN 5-325 MG PO TABS Oral Take 1-2 tablets by mouth every 6 (six) hours as needed for pain. 30 tablet 0  . PROMETHAZINE HCL 25 MG PO TABS Oral Take 1 tablet (25 mg total) by mouth every 6 (six) hours as needed for nausea. 30 tablet 1    BP 123/79  Pulse 98  Temp 97.8 F (36.6 C) (Oral)  Resp  16  SpO2 99%  LMP 11/29/2011  Physical Exam  ED Course  Procedures (including critical care time)  Labs Reviewed  CBC WITH DIFFERENTIAL - Abnormal; Notable for the following:    WBC 16.4 (*)     RBC 3.17 (*)     Hemoglobin 10.2 (*)     HCT 29.5 (*)     RDW 16.2 (*)     Platelets 491 (*)     Neutro Abs 12.1 (*)     Monocytes Absolute 1.6 (*)     All other components within normal limits  COMPREHENSIVE METABOLIC PANEL - Abnormal; Notable for the following:    Glucose, Bld 100 (*)     Total Bilirubin 1.6 (*)     All other components within normal limits  RETICULOCYTES - Abnormal; Notable for the following:    Retic Ct Pct 10.6 (*)     RBC. 3.17 (*)     Retic Count, Manual 336.0 (*)     All other components within normal limits  URINALYSIS, ROUTINE W REFLEX MICROSCOPIC   No results found.   1. Vaso-occlusive sickle cell crisis       MDM  Patient desires to go home. Discharge home with Percocet and Zofran.  Patient nontoxic at discharge        Donnetta Hutching,  MD 12/06/11 [phone]

## 2011-12-10 IMAGING — US US OB TRANSVAGINAL
1 series · 13 of 28 positions shown · non-contrast
Comparison: Pelvic ultrasound performed 01/15/2011

CLINICAL DATA: Sickle cell crisis; assess viability of pregnancy
and follow up interval growth.

TRANSVAGINAL OB ULTRASOUND
TECHNIQUE: Transvaginal ultrasound was performed for evaluation of
the gestation as well as the maternal uterus and adnexal regions.

[Series 1: us ob transvaginal · 13 of 29 slices shown]
[im 2/29]
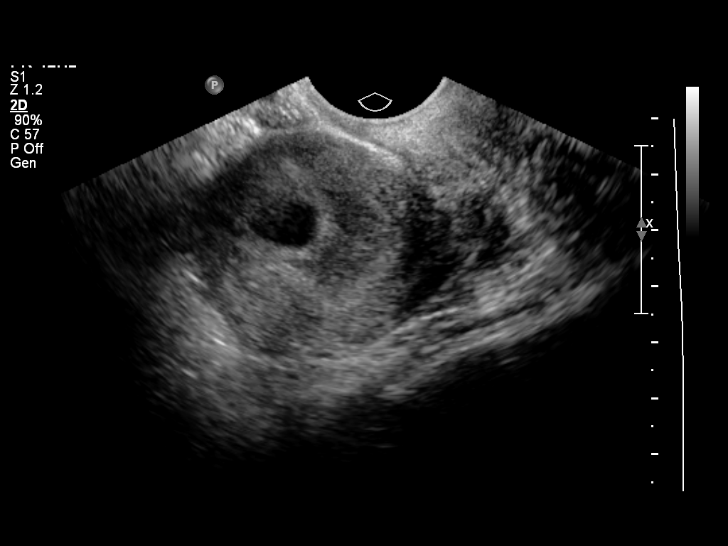
[im 4/29]
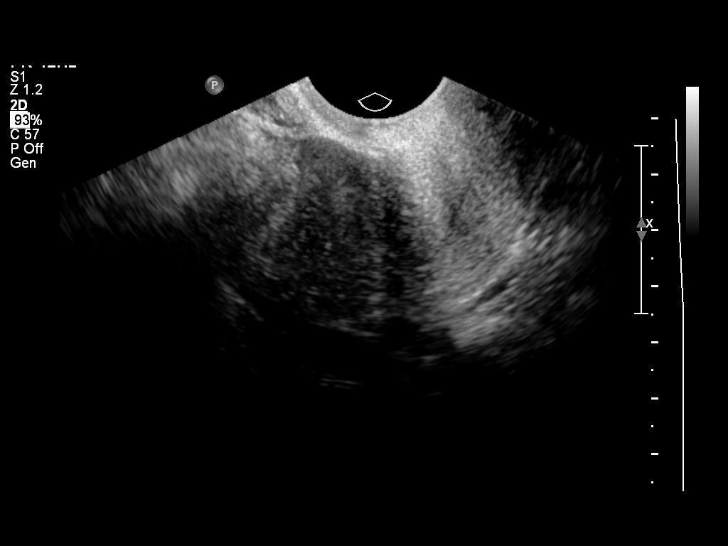
[im 6/29]
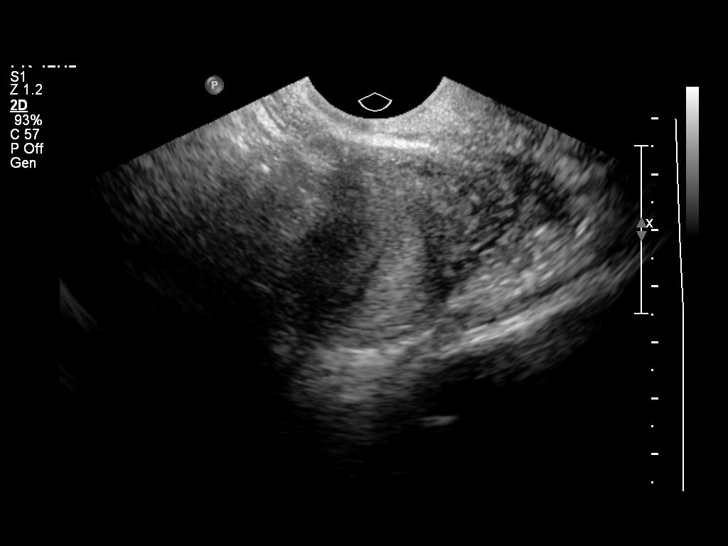
[im 8/29]
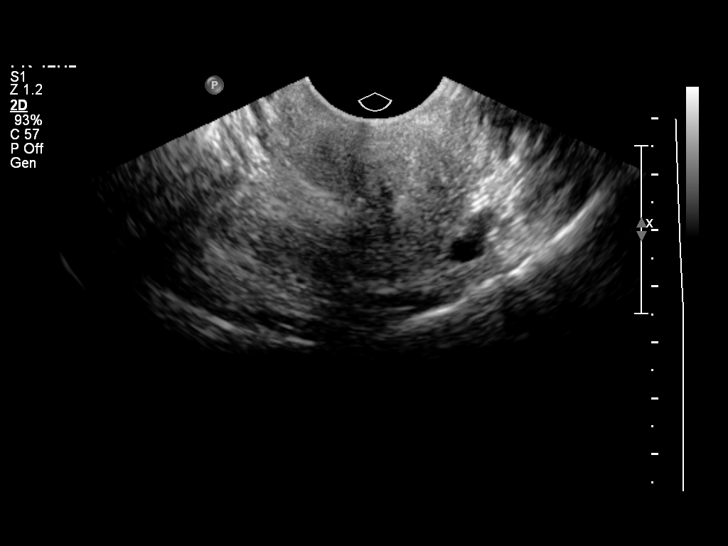
[im 10/29]
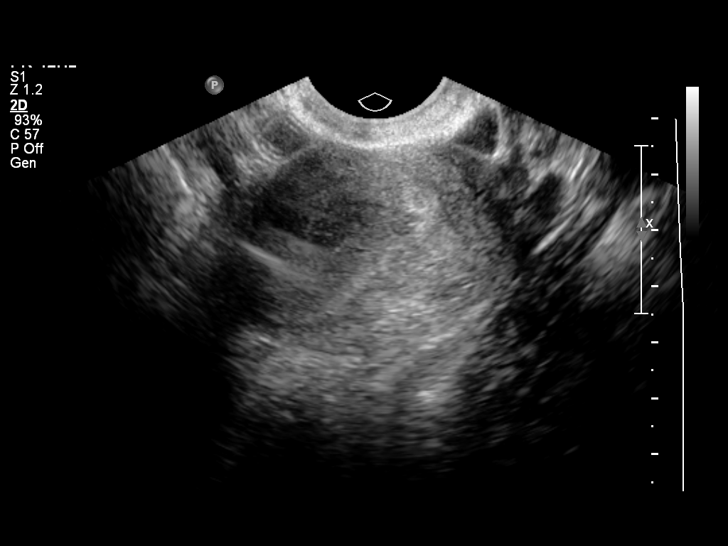
[im 12/29]
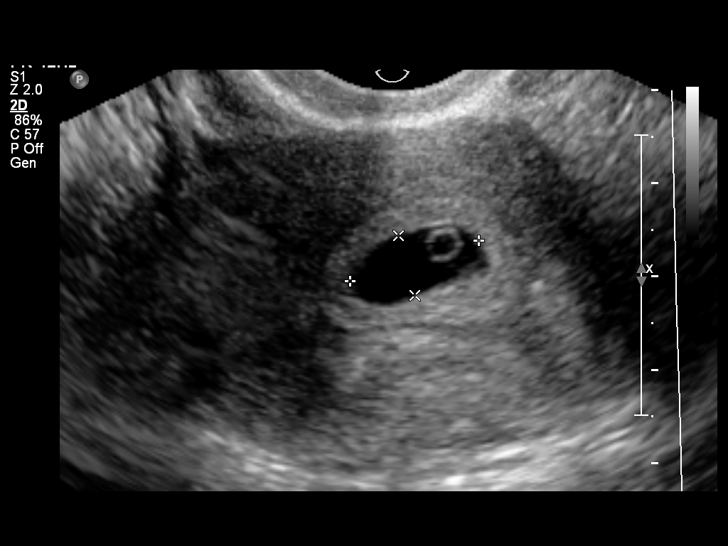
[im 15/29]
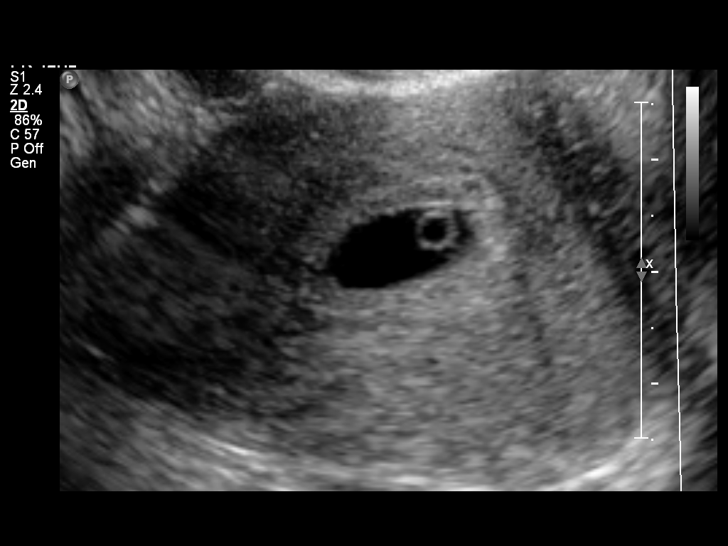
[im 17/29]
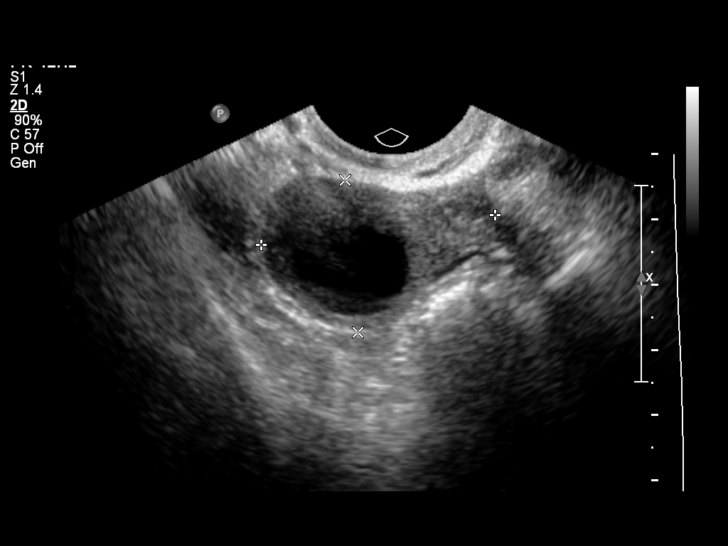
[im 19/29]
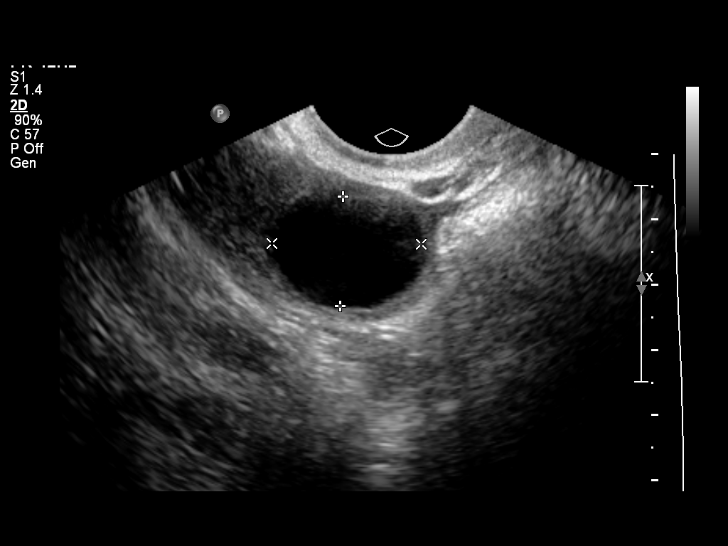
[im 21/29]
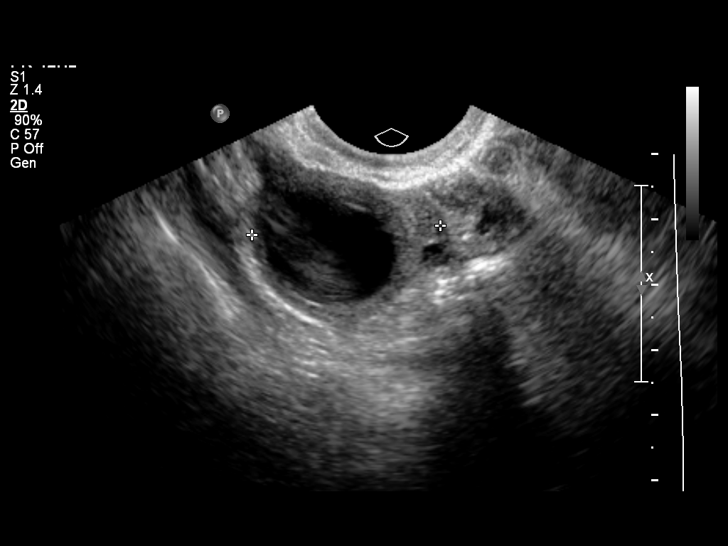
[im 23/29]
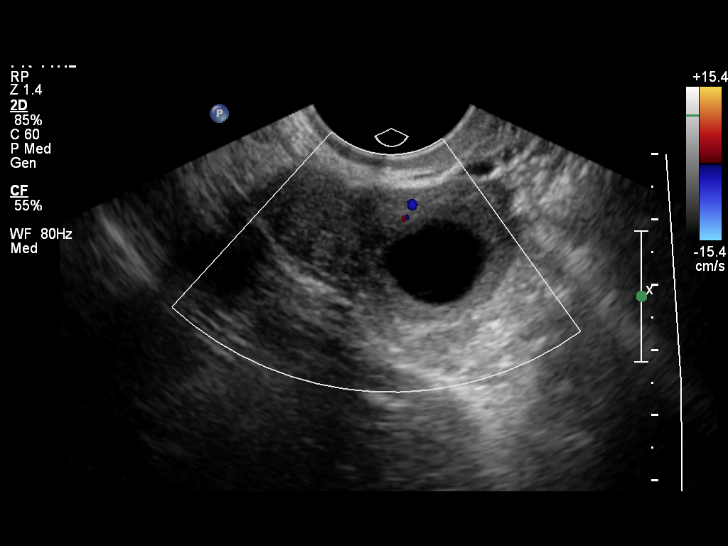
[im 25/29]
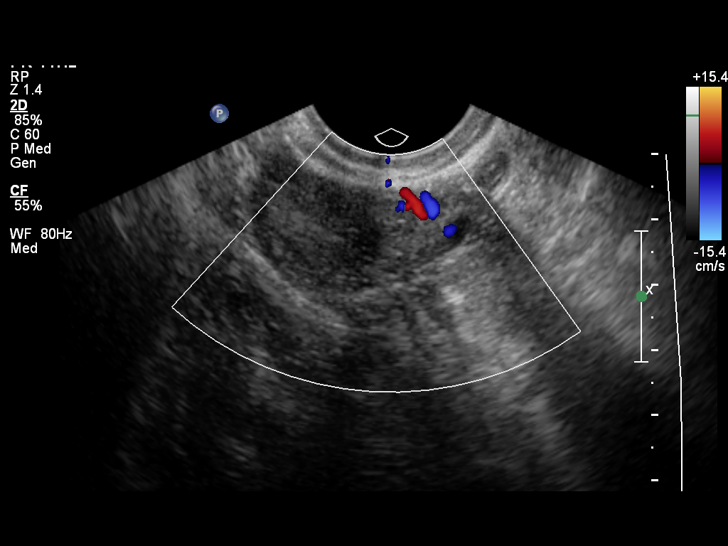
[im 27/29]
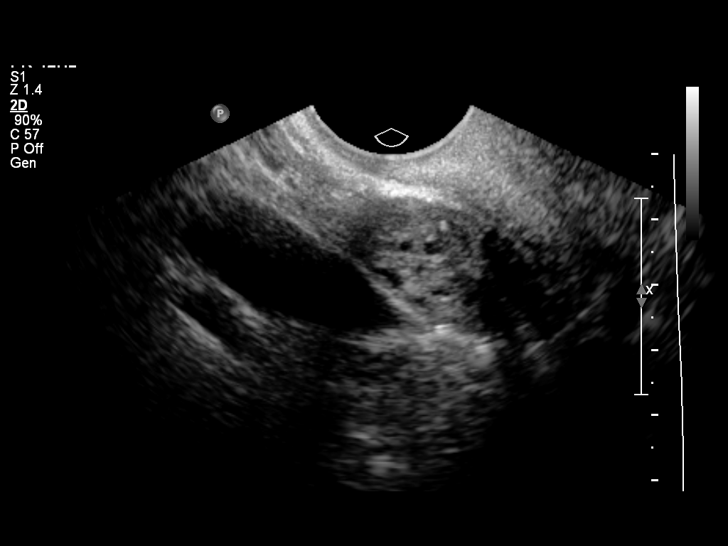

[13 of 28 positions shown; findings below may reference images not displayed]

FINDINGS: Intrauterine gestational sac: Visualized/normal in shape.
Yolk sac: Yes
Embryo: No
Cardiac Activity: N/A

MSD: 11.1 mm  5w  6d                US EDC: 09/21/2011

Maternal uterus/Adnexae:
The uterus is otherwise unremarkable in appearance.

The ovaries are within normal limits.  The right ovary measures
x 2.9 x 2.9 cm, while the left ovary measures 2.9 x 1.6 x 1.7 cm.
No suspicious adnexal masses are seen; a corpus luteal cyst is
identified on the right side.  Limited color Doppler evaluation
demonstrates grossly normal color Doppler blood flow within the
right ovary.

No free fluid is seen within the pelvic cul-de-sac.
IMPRESSION: Single intrauterine gestational sac noted, with a mean sac diameter
of 1.1 cm, corresponding to a gestational age of 5 weeks 6 days.
At this age, the embryo may not yet be visible; a yolk sac is now
seen.

The gestational sac has grown slightly less than expected since the
prior ultrasound, but still remains within normal limits.  The
prior ultrasound reflects an estimated gestational age of 6 weeks 2
days, reflecting a date of delivery September 18, 2011.

## 2011-12-11 ENCOUNTER — Emergency Department (HOSPITAL_COMMUNITY): Payer: Medicaid Other

## 2011-12-11 ENCOUNTER — Encounter (HOSPITAL_COMMUNITY): Payer: Self-pay | Admitting: *Deleted

## 2011-12-11 ENCOUNTER — Inpatient Hospital Stay (HOSPITAL_COMMUNITY)
Admission: EM | Admit: 2011-12-11 | Discharge: 2011-12-14 | DRG: 812 | Disposition: A | Discharge status: Home / Self Care | Payer: Medicaid Other | Attending: Internal Medicine | Admitting: Internal Medicine

## 2011-12-11 DIAGNOSIS — D571 Sickle-cell disease without crisis: Secondary | ICD-10-CM

## 2011-12-11 DIAGNOSIS — M8700 Idiopathic aseptic necrosis of unspecified bone: Secondary | ICD-10-CM

## 2011-12-11 DIAGNOSIS — Z9089 Acquired absence of other organs: Secondary | ICD-10-CM

## 2011-12-11 DIAGNOSIS — K219 Gastro-esophageal reflux disease without esophagitis: Secondary | ICD-10-CM | POA: Diagnosis present

## 2011-12-11 DIAGNOSIS — F3289 Other specified depressive episodes: Secondary | ICD-10-CM | POA: Diagnosis present

## 2011-12-11 DIAGNOSIS — M8708 Idiopathic aseptic necrosis of bone, other site: Secondary | ICD-10-CM | POA: Diagnosis present

## 2011-12-11 DIAGNOSIS — F329 Major depressive disorder, single episode, unspecified: Secondary | ICD-10-CM | POA: Diagnosis present

## 2011-12-11 DIAGNOSIS — F17200 Nicotine dependence, unspecified, uncomplicated: Secondary | ICD-10-CM | POA: Diagnosis present

## 2011-12-11 DIAGNOSIS — D5700 Hb-SS disease with crisis, unspecified: Principal | ICD-10-CM | POA: Diagnosis present

## 2011-12-11 LAB — COMPREHENSIVE METABOLIC PANEL
BUN: 7 mg/dL (ref 6–23)
CO2: 23 mEq/L (ref 19–32)
Chloride: 104 mEq/L (ref 96–112)
Creatinine, Ser: 0.87 mg/dL (ref 0.50–1.10)
GFR calc non Af Amer: >90 mL/min (ref >90)
Total Bilirubin: 1.5 mg/dL — ABNORMAL HIGH (ref 0.3–1.2)

## 2011-12-11 LAB — CBC
HCT: 27.5 % — ABNORMAL LOW (ref 36.0–46.0)
MCH: 32.3 pg (ref 26.0–34.0)
MCV: 91.7 fL (ref 78.0–100.0)
RBC: 3 MIL/uL — ABNORMAL LOW (ref 3.87–5.11)
RDW: 15.6 % — ABNORMAL HIGH (ref 11.5–15.5)
WBC: 14.1 10*3/uL — ABNORMAL HIGH (ref 4.0–10.5)

## 2011-12-11 LAB — RETICULOCYTES
RBC.: 3 MIL/uL — ABNORMAL LOW (ref 3.87–5.11)
Retic Ct Pct: 9.9 % — ABNORMAL HIGH (ref 0.4–3.1)

## 2011-12-11 MED ORDER — SODIUM CHLORIDE 0.9 % IV BOLUS (SEPSIS)
1000.0000 mL | Freq: Once | INTRAVENOUS | Status: AC
  Administered 2011-12-11: 1000 mL via INTRAVENOUS

## 2011-12-11 MED ORDER — HYDROMORPHONE HCL PF 1 MG/ML IJ SOLN: 1.0000 mg | INTRAMUSCULAR | Status: DC | PRN

## 2011-12-11 MED ORDER — ONDANSETRON HCL 4 MG/2ML IJ SOLN
4.0000 mg | Freq: Once | INTRAMUSCULAR | Status: AC
  Administered 2011-12-11: 4 mg via INTRAVENOUS
  Filled 2011-12-11: qty 2

## 2011-12-11 MED ORDER — SODIUM CHLORIDE 0.9 % IV SOLN: INTRAVENOUS | Status: AC

## 2011-12-11 MED ORDER — HYDROMORPHONE HCL PF 2 MG/ML IJ SOLN
2.0000 mg | Freq: Once | INTRAMUSCULAR | Status: AC
  Administered 2011-12-11: 2 mg via INTRAVENOUS
  Filled 2011-12-11: qty 1

## 2011-12-11 MED ORDER — HYDROMORPHONE HCL PF 1 MG/ML IJ SOLN
1.0000 mg | Freq: Once | INTRAMUSCULAR | Status: AC
  Administered 2011-12-11: 1 mg via INTRAVENOUS
  Filled 2011-12-11: qty 1

## 2011-12-11 NOTE — ED Notes (Signed)
Pt yelling for staff to remove boyfriend from room.  Boyfriend has left.  Off duty officer speaking with pt.

## 2011-12-11 NOTE — ED Notes (Signed)
Pt states she began having bilateral wrist pain and posterior left shoulder pain this a.m.  Pt also endorses nausea, but denies vomiting.  Pt's lung sounds are clear and she has good peripheral pulses in all 4 extremities.  Pt has bowel sounds and abdomen is soft and non-tender to palpation.  No peripheral edema noted, but patient states her left shoulder is swollen.  It is not obviously swollen compared to right shoulder.  Pt's lung sounds are clear.

## 2011-12-11 NOTE — ED Notes (Signed)
Pt states she began having bilateral wrist pain with pain in posterior left shoulder this a.m.  Pt also endorses nausea, but denies vomiting.  Pt denies SOB, but states she has felt "light-headed."

## 2011-12-11 NOTE — ED Notes (Signed)
Attempted IV start x2 with no success.  IV Team paged.

## 2011-12-12 DIAGNOSIS — D571 Sickle-cell disease without crisis: Secondary | ICD-10-CM

## 2011-12-12 DIAGNOSIS — D5700 Hb-SS disease with crisis, unspecified: Principal | ICD-10-CM

## 2011-12-12 DIAGNOSIS — M8700 Idiopathic aseptic necrosis of unspecified bone: Secondary | ICD-10-CM

## 2011-12-12 LAB — CBC
Hemoglobin: 9.1 g/dL — ABNORMAL LOW (ref 12.0–15.0)
MCHC: 35 g/dL (ref 30.0–36.0)
RBC: 2.83 MIL/uL — ABNORMAL LOW (ref 3.87–5.11)
WBC: 13.9 10*3/uL — ABNORMAL HIGH (ref 4.0–10.5)

## 2011-12-12 MED ORDER — SODIUM CHLORIDE 0.45 % IV SOLN
INTRAVENOUS | Status: DC
  Administered 2011-12-12: 1000 mL via INTRAVENOUS
  Administered 2011-12-12 – 2011-12-14 (×3): via INTRAVENOUS

## 2011-12-12 MED ORDER — HYDROMORPHONE HCL PF 1 MG/ML IJ SOLN: 1.0000 mg | INTRAMUSCULAR | Status: DC | PRN

## 2011-12-12 MED ORDER — ONDANSETRON HCL 8 MG PO TABS
8.0000 mg | ORAL_TABLET | ORAL | Status: DC | PRN
  Administered 2011-12-13 (×2): 8 mg via ORAL
  Filled 2011-12-12 (×2): qty 1

## 2011-12-12 MED ORDER — HYDROMORPHONE HCL PF 2 MG/ML IJ SOLN
2.0000 mg | INTRAMUSCULAR | Status: DC | PRN
  Administered 2011-12-12 – 2011-12-13 (×11): 2 mg via INTRAVENOUS
  Filled 2011-12-12 (×11): qty 1

## 2011-12-12 MED ORDER — ALPRAZOLAM 0.5 MG PO TABS: 0.5000 mg | ORAL_TABLET | Freq: Three times a day (TID) | ORAL | Status: DC | PRN

## 2011-12-12 MED ORDER — VITAMIN D3 25 MCG (1000 UNIT) PO TABS
1000.0000 [IU] | ORAL_TABLET | Freq: Every day | ORAL | Status: DC
  Administered 2011-12-12 – 2011-12-14 (×3): 1000 [IU] via ORAL
  Filled 2011-12-12 (×3): qty 1

## 2011-12-12 MED ORDER — SENNA 8.6 MG PO TABS
1.0000 | ORAL_TABLET | Freq: Two times a day (BID) | ORAL | Status: DC
  Administered 2011-12-12 – 2011-12-14 (×6): 8.6 mg via ORAL
  Filled 2011-12-12 (×6): qty 1

## 2011-12-12 MED ORDER — FOLIC ACID 1 MG PO TABS
1.0000 mg | ORAL_TABLET | Freq: Every day | ORAL | Status: DC
  Administered 2011-12-12 – 2011-12-14 (×3): 1 mg via ORAL
  Filled 2011-12-12 (×3): qty 1

## 2011-12-12 MED ORDER — LIDOCAINE 5 % EX PTCH
1.0000 | MEDICATED_PATCH | Freq: Every day | CUTANEOUS | Status: DC
  Administered 2011-12-12: 1 via TRANSDERMAL
  Filled 2011-12-12 (×4): qty 1

## 2011-12-12 MED ORDER — OXYCODONE HCL 5 MG PO TABS: 10.0000 mg | ORAL_TABLET | Freq: Four times a day (QID) | ORAL | Status: DC | PRN

## 2011-12-12 MED ORDER — FLUVOXAMINE MALEATE 50 MG PO TABS
50.0000 mg | ORAL_TABLET | Freq: Every day | ORAL | Status: DC
  Administered 2011-12-12 (×2): 50 mg via ORAL
  Filled 2011-12-12 (×5): qty 1

## 2011-12-12 MED ORDER — CYCLOBENZAPRINE HCL 5 MG PO TABS
5.0000 mg | ORAL_TABLET | Freq: Three times a day (TID) | ORAL | Status: DC | PRN
  Filled 2011-12-12: qty 1

## 2011-12-12 MED ORDER — DIPHENHYDRAMINE HCL 25 MG PO CAPS: 25.0000 mg | ORAL_CAPSULE | ORAL | Status: DC | PRN

## 2011-12-12 MED ORDER — HYDROXYUREA 500 MG PO CAPS
1000.0000 mg | ORAL_CAPSULE | Freq: Every day | ORAL | Status: DC
  Administered 2011-12-12 – 2011-12-14 (×3): 1000 mg via ORAL
  Filled 2011-12-12 (×3): qty 2

## 2011-12-12 MED ORDER — ZOLPIDEM TARTRATE 5 MG PO TABS: 5.0000 mg | ORAL_TABLET | Freq: Every evening | ORAL | Status: DC | PRN

## 2011-12-12 MED ORDER — ENOXAPARIN SODIUM 40 MG/0.4ML ~~LOC~~ SOLN
40.0000 mg | Freq: Every day | SUBCUTANEOUS | Status: DC
  Administered 2011-12-12 – 2011-12-13 (×2): 40 mg via SUBCUTANEOUS
  Filled 2011-12-12 (×4): qty 0.4

## 2011-12-12 MED ORDER — DIPHENHYDRAMINE HCL 50 MG/ML IJ SOLN
12.5000 mg | INTRAMUSCULAR | Status: DC | PRN
  Administered 2011-12-12 – 2011-12-14 (×7): 25 mg via INTRAVENOUS
  Administered 2011-12-14: 12.5 mg via INTRAVENOUS
  Filled 2011-12-12 (×8): qty 1

## 2011-12-12 NOTE — Progress Notes (Signed)
Subjective:  Patient reports she's feeling better. States she Feels overwhelmed with multiple disputes she had with her boyfriend. She acknowledges feeling embarrassed that this happens each time she comes into the hospital. She understands that he will not be able to visit her in the future. She is contemplating ended a relationship as well. Relationship of her stressors 2her crisis was discussed briefly. This will need further followup as an outpatient as well. Presently rates the pain as a 8/10. She's not quite able to manage her pain as an outpatient at this time.   Allergies  Allergen Reactions  . Toradol (Ketorolac Tromethamine) Hives and Itching   Current Facility-Administered Medications  Medication Dose Route Frequency Provider Last Rate Last Dose  . 0.45 % sodium chloride infusion   Intravenous Continuous Sorin C Lavera Guise, MD 50 mL/hr at 12/12/11 1910    . 0.9 %  sodium chloride infusion   Intravenous STAT Derwood Kaplan, MD      . ALPRAZolam Prudy Feeler) tablet 0.5 mg  0.5 mg Oral TID PRN Sorin Luanne Bras, MD      . cholecalciferol (VITAMIN D) tablet 1,000 Units  1,000 Units Oral Daily Sorin Luanne Bras, MD   1,000 Units at 12/12/11 1109  . cyclobenzaprine (FLEXERIL) tablet 5 mg  5 mg Oral TID PRN Sorin Luanne Bras, MD      . diphenhydrAMINE (BENADRYL) capsule 25-50 mg  25-50 mg Oral Q4H PRN Sorin Luanne Bras, MD       Or  . diphenhydrAMINE (BENADRYL) injection 12.5-25 mg  12.5-25 mg Intravenous Q4H PRN Sorin Luanne Bras, MD   25 mg at 12/12/11 1911  . enoxaparin (LOVENOX) injection 40 mg  40 mg Subcutaneous QHS Sorin Luanne Bras, MD   40 mg at 12/12/11 0119  . fluvoxaMINE (LUVOX) tablet 50 mg  50 mg Oral QHS Sorin Luanne Bras, MD   50 mg at 12/12/11 2109  . folic acid (FOLVITE) tablet 1 mg  1 mg Oral Daily Sorin Luanne Bras, MD   1 mg at 12/12/11 1109  . HYDROmorphone (DILAUDID) injection 2 mg  2 mg Intravenous Q2H PRN Sorin Luanne Bras, MD   2 mg at 12/12/11 2111  . hydroxyurea (HYDREA) capsule 1,000 mg  1,000 mg Oral Daily Sorin Luanne Bras, MD   1,000 mg at 12/12/11 1109  . lidocaine (LIDODERM) 5 % 1 patch  1 patch Transdermal QHS Sorin Luanne Bras, MD   1 patch at 12/12/11 0119  . ondansetron (ZOFRAN) tablet 8 mg  8 mg Oral Q4H PRN Sorin Luanne Bras, MD      . oxyCODONE (Oxy IR/ROXICODONE) immediate release tablet 10 mg  10 mg Oral QID PRN Sorin Luanne Bras, MD      . senna (SENOKOT) tablet 8.6 mg  1 tablet Oral BID Sorin Luanne Bras, MD   8.6 mg at 12/12/11 2109  . zolpidem (AMBIEN) tablet 5 mg  5 mg Oral QHS PRN,MR X 1 Sorin Luanne Bras, MD      . DISCONTD: HYDROmorphone (DILAUDID) injection 1 mg  1 mg Intravenous Q4H PRN Derwood Kaplan, MD      . DISCONTD: HYDROmorphone (DILAUDID) injection 1 mg  1 mg Intravenous Q2H PRN Sorin Luanne Bras, MD        Objective: Blood pressure 116/77, pulse 108, temperature 98.3 F (36.8 C), temperature source Oral, resp. rate 20, height 5\' 3"  (1.6 m), weight 179 lb 0.2 oz (81.2 kg), last menstrual period 11/29/2011, SpO2 96.00%.  Well-developed well-nourished anxious black female in  no acute distress. HEENT:no sinus tenderness. No sclera icterus. NECK:no enlarged thyroid. No posterior cervical nodes. LUNGS:clear to auscultation without wheezes. ZO:XWRUEA S1, S2 without S3. VWU:JWJXBJYNW. GNF:AOZHYQMV Homans. No edema.Leftshoulder tender to palpation. No increased warmth. NEURO:intact. Psychiatric as noted.  Lab results: Results for orders placed during the hospital encounter of 12/11/11 (from the past 48 hour(s))  CBC     Status: Abnormal   Collection Time   12/11/11  6:08 PM      Component Value Range Comment   WBC 14.1 (*) 4.0 - 10.5 K/uL    RBC 3.00 (*) 3.87 - 5.11 MIL/uL    Hemoglobin 9.7 (*) 12.0 - 15.0 g/dL    HCT 78.4 (*) 69.6 - 46.0 %    MCV 91.7  78.0 - 100.0 fL    MCH 32.3  26.0 - 34.0 pg    MCHC 35.3  30.0 - 36.0 g/dL    RDW 29.5 (*) 28.4 - 15.5 %    Platelets 566 (*) 150 - 400 K/uL   COMPREHENSIVE METABOLIC PANEL     Status: Abnormal   Collection Time   12/11/11  6:08 PM      Component  Value Range Comment   Sodium 139  135 - 145 mEq/L    Potassium 3.8  3.5 - 5.1 mEq/L    Chloride 104  96 - 112 mEq/L    CO2 23  19 - 32 mEq/L    Glucose, Bld 93  70 - 99 mg/dL    BUN 7  6 - 23 mg/dL    Creatinine, Ser 1.32  0.50 - 1.10 mg/dL    Calcium 9.0  8.4 - 44.0 mg/dL    Total Protein 7.1  6.0 - 8.3 g/dL    Albumin 3.8  3.5 - 5.2 g/dL    AST 24  0 - 37 U/L NO VISIBLE HEMOLYSIS   ALT 13  0 - 35 U/L    Alkaline Phosphatase 76  39 - 117 U/L    Total Bilirubin 1.5 (*) 0.3 - 1.2 mg/dL    GFR calc non Af Amer >90  >90 mL/min    GFR calc Af Amer >90  >90 mL/min   RETICULOCYTES     Status: Abnormal   Collection Time   12/11/11  6:08 PM      Component Value Range Comment   Retic Ct Pct 9.9 (*) 0.4 - 3.1 %    RBC. 3.00 (*) 3.87 - 5.11 MIL/uL    Retic Count, Manual 297.0 (*) 19.0 - 186.0 K/uL   LACTATE DEHYDROGENASE     Status: Abnormal   Collection Time   12/11/11  6:08 PM      Component Value Range Comment   LDH 326 (*) 94 - 250 U/L SLIGHT HEMOLYSIS  CBC     Status: Abnormal   Collection Time   12/12/11  4:02 AM      Component Value Range Comment   WBC 13.9 (*) 4.0 - 10.5 K/uL    RBC 2.83 (*) 3.87 - 5.11 MIL/uL    Hemoglobin 9.1 (*) 12.0 - 15.0 g/dL    HCT 10.2 (*) 72.5 - 46.0 %    MCV 91.9  78.0 - 100.0 fL    MCH 32.2  26.0 - 34.0 pg    MCHC 35.0  30.0 - 36.0 g/dL    RDW 36.6 (*) 44.0 - 15.5 %    Platelets 565 (*) 150 - 400 K/uL     Studies/Results: Dg Wrist Complete  Left  12/11/2011  *RADIOLOGY REPORT*  Clinical Data: Left wrist pain, sickle cell.  LEFT WRIST - COMPLETE 3+ VIEW  Comparison: Contralateral wrist  Findings: No displaced fracture or dislocation.  No aggressive osseous lesions.  IMPRESSION:  No acute osseous abnormality identified.  Original Report Authenticated By: Waneta Martins, M.D.   Dg Wrist Complete Right  12/11/2011  *RADIOLOGY REPORT*  Clinical Data: Right wrist pain, sickle cell  RIGHT WRIST - COMPLETE 3+ VIEW  Comparison: Contralateral wrist   Findings: No displaced fracture or dislocation.  No aggressive osseous lesions.  IMPRESSION: No acute osseous abnormality identified.  Original Report Authenticated By: Waneta Martins, M.D.   Dg Chest Port 1 View  12/11/2011  *RADIOLOGY REPORT*  Clinical Data: Sickle cell disease with chest pain.  PORTABLE CHEST - 1 VIEW  Comparison: 11/29/2011.  Findings: The heart is upper limits of normal and stable.  There is chronic bronchitic type change in the lungs but no infiltrates, edema or effusions.  The bony thorax is intact.  IMPRESSION: Chronic bronchitic type lung changes but no acute pulmonary findings.  Original Report Authenticated By: P. Loralie Champagne, M.D.   Dg Shoulder Left  12/11/2011  *RADIOLOGY REPORT*  Clinical Data: Left shoulder pain, sickle cell.  LEFT SHOULDER - 2+ VIEW  Comparison: 03/11/2009 humeral radiograph  Findings: There is sclerosis involving the left humeral head, has progressed in the interval however without collapse.  No acute fracture or dislocation.  Heart size appears enlarged.  The left upper lung is predominately clear, with suggestion of bronchitic change.  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  Original Report Authenticated By: Waneta Martins, M.D.    Patient Active Problem List  Diagnosis  . SICKLE CELL ANEMIA  . TRICHOTILLOMANIA  . Active smoker  . Back pain  . Depression  . GERD (gastroesophageal reflux disease)  . Contraception management  . Stress  . Overweight  . Vaso-occlusive sickle cell crisis  . Right thigh pain  . Vaginosis  . Tachycardia    Impression: Sickle cell crisis resolving. Left shoulder pain secondary to avascular necrosis. Situational stress. Depression. Tobacco abuse improving.   Plan: Continue IV fluids and IV analgesia to assist with resolution of a pain crisis. Incentive spirometry. Orthopedic evaluation if no improvement of her shoulder. Patient education. Anticipate discharge  in 24-48 hours.     August Saucer, Hildred Mollica 12/12/2011 10:42 PM

## 2011-12-12 NOTE — H&P (Signed)
[name] is an 21 y.o. female.   PCP - Dr.Eric August Saucer. Chief Complaint: Left shoulder pain  HPI: 21 year-old female with known history of sickle cell disease presents to the ER with persistent pain in the left shoulder. She states that the pain is 10/10. Patient denies  chest pains . Denies any subjective feeling of fever chills or any productive cough. Despite giving multiple doses of analgesics patient is still having pain and has been admitted for pain control secondary to sickle cell disease.  Past Medical History  Diagnosis Date  . H/O: 1 miscarriage 03/22/2011  . 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 Depression 01/06/2011  . GERD (gastroesophageal reflux disease) 02/17/2011  . Trichotillomania     h/o  . Blood transfusion     "lots"  . Sickle cell anemia with crisis   . Exertional dyspnea     "sometimes"  . Sickle cell anemia   . Headache   . Migraines 11/08/11    "@ least twice/month"  . Chronic back pain     "very severe; have knot in my back; from tight muscle; take RX and exercise for it"  . Mood swings 11/08/11    "I go back and forth; real bad"    Past Surgical History  Procedure Date  . Cholecystectomy 05/2010  . Dilation and curettage of uterus 02/20/11    S/P miscarriage    No family history on file. Social History:  reports that she has never smoked. She has never used smokeless tobacco. She reports that she uses illicit drugs (Marijuana). She reports that she does not drink alcohol.  Allergies:  Allergies  Allergen Reactions  . Toradol (Ketorolac Tromethamine) Hives and Itching    Medications Prior to Admission  Medication Sig Dispense Refill  . ALPRAZolam (XANAX) 0.25 MG tablet Take 1-2 tablets (0.25-0.5 mg total) by mouth every 3 (three) hours as needed for anxiety.  60 tablet  1  . cholecalciferol (VITAMIN D) 1000 UNITS tablet Take 1,000 Units by mouth daily.      . clindamycin (CLEOCIN) 300 MG capsule Take 300 mg by mouth every 12 (twelve) hours.      .  cyclobenzaprine (FLEXERIL) 5 MG tablet Take 5 mg by mouth 2 (two) times daily as needed. muscle spasms.       . fluvoxaMINE (LUVOX) 50 MG tablet Take 1 tablet (50 mg total) by mouth at bedtime.  30 tablet  1  . hydroxyurea (HYDREA) 500 MG capsule Take 1,000 mg by mouth daily. May take with food to minimize GI side effects.      . metoprolol succinate (TOPROL-XL) 12.5 mg TB24 Take 0.5 tablets (12.5 mg total) by mouth daily.  60 tablet  0  . ondansetron (ZOFRAN) 8 MG tablet Take 8 mg by mouth every 4 (four) hours as needed.      . Oxycodone HCl 10 MG TABS Take 1 tablet (10 mg total) by mouth 4 (four) times daily as needed. For pain.  60 tablet  0  . promethazine (PHENERGAN) 25 MG tablet Take 1 tablet (25 mg total) by mouth every 6 (six) hours as needed for nausea.  30 tablet  1  . promethazine (PHENERGAN) 25 MG tablet Take 1 tablet (25 mg total) by mouth every 4 (four) hours as needed for nausea.  50 tablet  1    Results for orders placed during the hospital encounter of 12/11/11 (from the past 48 hour(s))  CBC     Status: Abnormal   Collection  Time   12/11/11  6:08 PM      Component Value Range Comment   WBC 14.1 (*) 4.0 - 10.5 K/uL    RBC 3.00 (*) 3.87 - 5.11 MIL/uL    Hemoglobin 9.7 (*) 12.0 - 15.0 g/dL    HCT 40.9 (*) 81.1 - 46.0 %    MCV 91.7  78.0 - 100.0 fL    MCH 32.3  26.0 - 34.0 pg    MCHC 35.3  30.0 - 36.0 g/dL    RDW 91.4 (*) 78.2 - 15.5 %    Platelets 566 (*) 150 - 400 K/uL   COMPREHENSIVE METABOLIC PANEL     Status: Abnormal   Collection Time   12/11/11  6:08 PM      Component Value Range Comment   Sodium 139  135 - 145 mEq/L    Potassium 3.8  3.5 - 5.1 mEq/L    Chloride 104  96 - 112 mEq/L    CO2 23  19 - 32 mEq/L    Glucose, Bld 93  70 - 99 mg/dL    BUN 7  6 - 23 mg/dL    Creatinine, Ser 9.56  0.50 - 1.10 mg/dL    Calcium 9.0  8.4 - 21.3 mg/dL    Total Protein 7.1  6.0 - 8.3 g/dL    Albumin 3.8  3.5 - 5.2 g/dL    AST 24  0 - 37 U/L NO VISIBLE HEMOLYSIS   ALT 13  0 -  35 U/L    Alkaline Phosphatase 76  39 - 117 U/L    Total Bilirubin 1.5 (*) 0.3 - 1.2 mg/dL    GFR calc non Af Amer >90  >90 mL/min    GFR calc Af Amer >90  >90 mL/min   RETICULOCYTES     Status: Abnormal   Collection Time   12/11/11  6:08 PM      Component Value Range Comment   Retic Ct Pct 9.9 (*) 0.4 - 3.1 %    RBC. 3.00 (*) 3.87 - 5.11 MIL/uL    Retic Count, Manual 297.0 (*) 19.0 - 186.0 K/uL   LACTATE DEHYDROGENASE     Status: Abnormal   Collection Time   12/11/11  6:08 PM      Component Value Range Comment   LDH 326 (*) 94 - 250 U/L SLIGHT HEMOLYSIS   Dg Wrist Complete Left  12/11/2011  *RADIOLOGY REPORT*  Clinical Data: Left wrist pain, sickle cell.  LEFT WRIST - COMPLETE 3+ VIEW  Comparison: Contralateral wrist  Findings: No displaced fracture or dislocation.  No aggressive osseous lesions.  IMPRESSION:  No acute osseous abnormality identified.  Original Report Authenticated By: Waneta Martins, M.D.   Dg Wrist Complete Right  12/11/2011  *RADIOLOGY REPORT*  Clinical Data: Right wrist pain, sickle cell  RIGHT WRIST - COMPLETE 3+ VIEW  Comparison: Contralateral wrist  Findings: No displaced fracture or dislocation.  No aggressive osseous lesions.  IMPRESSION: No acute osseous abnormality identified.  Original Report Authenticated By: Waneta Martins, M.D.   Dg Chest Port 1 View  12/11/2011  *RADIOLOGY REPORT*  Clinical Data: Sickle cell disease with chest pain.  PORTABLE CHEST - 1 VIEW  Comparison: 11/29/2011.  Findings: The heart is upper limits of normal and stable.  There is chronic bronchitic type change in the lungs but no infiltrates, edema or effusions.  The bony thorax is intact.  IMPRESSION: Chronic bronchitic type lung changes but no acute pulmonary findings.  Original  Report Authenticated By: P. Loralie Champagne, M.D.   Dg Shoulder Left  12/11/2011  *RADIOLOGY REPORT*  Clinical Data: Left shoulder pain, sickle cell.  LEFT SHOULDER - 2+ VIEW  Comparison: 03/11/2009  humeral radiograph  Findings: There is sclerosis involving the left humeral head, has progressed in the interval however without collapse.  No acute fracture or dislocation.  Heart size appears enlarged.  The left upper lung is predominately clear, with suggestion of bronchitic change.  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  Original Report Authenticated By: Waneta Martins, M.D.    Review of Systems  Constitutional: Negative.   HENT: Negative.   Eyes: Negative.   Respiratory: Negative.   Cardiovascular: Negative for chest pain.  Gastrointestinal: Negative.   Genitourinary: Negative.   Musculoskeletal: Positive for back pain and joint pain.  Skin: Negative.   Neurological: Negative.   Endo/Heme/Allergies: Negative.   Psychiatric/Behavioral: Negative.     Blood pressure 111/81, pulse 107, resp. rate 21, last menstrual period 11/29/2011, SpO2 100.00%. Physical Exam  Constitutional: She is oriented to person, place, and time. She appears well-developed and well-nourished. No distress.  HENT:  Head: Normocephalic and atraumatic.  Right Ear: External ear normal.  Left Ear: External ear normal.  Nose: Nose normal.  Mouth/Throat: Oropharynx is clear and moist. No oropharyngeal exudate.  Eyes: Conjunctivae are normal. Pupils are equal, round, and reactive to light. Right eye exhibits no discharge. Left eye exhibits no discharge. No scleral icterus.  Neck: Normal range of motion. Neck supple.  Cardiovascular: Normal rate and regular rhythm.   Respiratory: Effort normal and breath sounds normal. No respiratory distress. She has no wheezes. She has no rales.  GI: Soft. Bowel sounds are normal.  Musculoskeletal: Normal range of motion. She exhibits no edema and no tenderness.  Neurological: She is alert and oriented to person, place, and time.       Moves all extremities.  Skin: Skin is warm and dry. She is not diaphoretic.  Psychiatric: Her behavior is  normal.     Assessment/Plan #1. Sickle cell pain crisis - patient will be continued on pain relief medications, I have ordered Dilaudid IV when necessary breakthrough pain along with her scheduled pain medications. Continue with hydration with 1/2 NS . Also add a lidoderm patch to the left shoulder .  #2. Sickle cell anemia.- c/w Hydroxyurea  3. Depression - continue Fluvoxamine   Patient is admitted under Dr. August Saucer service. CODE STATUS - full code.   Khloei Spiker Lavera Guise, MD 12/12/2011, 12:15 AM

## 2011-12-13 ENCOUNTER — Inpatient Hospital Stay (HOSPITAL_COMMUNITY): Payer: Medicaid Other

## 2011-12-13 LAB — URINALYSIS, ROUTINE W REFLEX MICROSCOPIC
Bilirubin Urine: NEGATIVE
Hgb urine dipstick: NEGATIVE
Ketones, ur: NEGATIVE mg/dL
Protein, ur: NEGATIVE mg/dL
Urobilinogen, UA: 1 mg/dL (ref 0.0–1.0)

## 2011-12-13 LAB — COMPREHENSIVE METABOLIC PANEL
ALT: 13 U/L (ref 0–35)
Alkaline Phosphatase: 75 U/L (ref 39–117)
CO2: 27 mEq/L (ref 19–32)
Chloride: 100 mEq/L (ref 96–112)
GFR calc Af Amer: >90 mL/min (ref >90)
Glucose, Bld: 90 mg/dL (ref 70–99)
Potassium: 3.9 mEq/L (ref 3.5–5.1)
Sodium: 137 mEq/L (ref 135–145)
Total Bilirubin: 2.7 mg/dL — ABNORMAL HIGH (ref 0.3–1.2)
Total Protein: 7.2 g/dL (ref 6.0–8.3)

## 2011-12-13 LAB — CBC
HCT: 26.7 % — ABNORMAL LOW (ref 36.0–46.0)
MCHC: 35.2 g/dL (ref 30.0–36.0)
RDW: 16.6 % — ABNORMAL HIGH (ref 11.5–15.5)

## 2011-12-13 MED ORDER — HYDROMORPHONE HCL PF 4 MG/ML IJ SOLN
4.0000 mg | INTRAMUSCULAR | Status: DC | PRN
  Administered 2011-12-13 – 2011-12-14 (×13): 4 mg via INTRAVENOUS
  Filled 2011-12-13 (×13): qty 1

## 2011-12-13 MED ORDER — PANTOPRAZOLE SODIUM 20 MG PO TBEC
20.0000 mg | DELAYED_RELEASE_TABLET | Freq: Once | ORAL | Status: AC
  Administered 2011-12-13: 20 mg via ORAL
  Filled 2011-12-13 (×2): qty 1

## 2011-12-13 MED ORDER — PANTOPRAZOLE SODIUM 20 MG PO TBEC: 20.0000 mg | DELAYED_RELEASE_TABLET | Freq: Every day | ORAL | Status: DC

## 2011-12-13 MED ORDER — OXYCODONE HCL 10 MG PO TABS: 15.0000 mg | ORAL_TABLET | Freq: Four times a day (QID) | ORAL | Status: DC | PRN

## 2011-12-13 NOTE — Progress Notes (Signed)
When administering IV medication at roughly 6:00 p.m., this RN was present when a third full dinner tray (meat, starch, vegetable, dessert) arrived to the patient's room.  Earlier in the day, it had been noted that a lunch tray arrived with four large sugar cookies.  A typical serving size is one large sugar cookie.  This raises concern that the patient is abusing the privilege of a regular diet, and needs to be given firm parameters on ordering a single meal tray.  Leadership on the floor (e.g., Chiropodist) will be notified, as the kitchen indicated that only an RN or an MD can impose limits on patient's ordering.  Kitchen staff also said, "she does this every time she is admitted.  We are well aware that she orders multiple trays."  Charge nurse requested that this RN document the occurrence, so that MD is aware of patient's practice.  Education on restricting meal portions will be attempted.  Consider carb modified or heart healthy diet in the future, or other parameters restricting the number of meals this patient can order?  Thank you.

## 2011-12-13 NOTE — Progress Notes (Signed)
Received a telephone call from NP concerning postponement of discharge, pending results of urinalysis and chest X-ray, due to labs that were elevated (WBC, Bilirubin in particular).  New orders received.  Anticipate discharge in the a.m. If those tests are clear.  Will continue to monitor.

## 2011-12-14 ENCOUNTER — Non-Acute Institutional Stay (HOSPITAL_COMMUNITY)
Admission: AD | Admit: 2011-12-14 | Discharge: 2011-12-15 | Disposition: A | Discharge status: Home / Self Care | Payer: Medicaid Other | Source: Ambulatory Visit | Attending: Internal Medicine | Admitting: Internal Medicine

## 2011-12-14 ENCOUNTER — Encounter (HOSPITAL_COMMUNITY): Payer: Self-pay | Admitting: Hematology

## 2011-12-14 LAB — COMPREHENSIVE METABOLIC PANEL
ALT: 16 U/L (ref 0–35)
Alkaline Phosphatase: 77 U/L (ref 39–117)
CO2: 26 mEq/L (ref 19–32)
GFR calc Af Amer: >90 mL/min (ref >90)
GFR calc non Af Amer: >90 mL/min (ref >90)
Glucose, Bld: 91 mg/dL (ref 70–99)
Total Bilirubin: 2.1 mg/dL — ABNORMAL HIGH (ref 0.3–1.2)
Total Protein: 7.2 g/dL (ref 6.0–8.3)

## 2011-12-14 LAB — CBC
MCH: 32.1 pg (ref 26.0–34.0)
MCHC: 34.8 g/dL (ref 30.0–36.0)
MCV: 92.1 fL (ref 78.0–100.0)
Platelets: 612 10*3/uL — ABNORMAL HIGH (ref 150–400)
RDW: 17.2 % — ABNORMAL HIGH (ref 11.5–15.5)

## 2011-12-14 MED ORDER — FOLIC ACID 1 MG PO TABS
1.0000 mg | ORAL_TABLET | Freq: Every day | ORAL | Status: DC
  Administered 2011-12-15: 1 mg via ORAL
  Filled 2011-12-14: qty 1

## 2011-12-14 MED ORDER — DEXTROSE-NACL 5-0.45 % IV SOLN
INTRAVENOUS | Status: DC
  Administered 2011-12-14 – 2011-12-15 (×3): via INTRAVENOUS

## 2011-12-14 MED ORDER — ONDANSETRON HCL 4 MG PO TABS: 4.0000 mg | ORAL_TABLET | ORAL | Status: DC | PRN

## 2011-12-14 MED ORDER — ONDANSETRON HCL 4 MG/2ML IJ SOLN: 4.0000 mg | INTRAMUSCULAR | Status: DC | PRN

## 2011-12-14 MED ORDER — DIPHENHYDRAMINE HCL 50 MG/ML IJ SOLN
12.5000 mg | INTRAMUSCULAR | Status: DC | PRN
  Administered 2011-12-15: 25 mg via INTRAVENOUS
  Filled 2011-12-14: qty 1

## 2011-12-14 MED ORDER — HYDROMORPHONE HCL PF 2 MG/ML IJ SOLN
1.0000 mg | INTRAMUSCULAR | Status: DC | PRN
  Administered 2011-12-14 – 2011-12-15 (×4): 2 mg via INTRAVENOUS
  Administered 2011-12-15: 1 mg via INTRAVENOUS
  Administered 2011-12-15: 2 mg via INTRAVENOUS
  Administered 2011-12-15: 1 mg via INTRAVENOUS
  Filled 2011-12-14 (×7): qty 1

## 2011-12-14 MED ORDER — DIPHENHYDRAMINE HCL 25 MG PO CAPS: 25.0000 mg | ORAL_CAPSULE | ORAL | Status: DC | PRN

## 2011-12-14 MED ORDER — CYCLOBENZAPRINE HCL 10 MG PO TABS
10.0000 mg | ORAL_TABLET | Freq: Three times a day (TID) | ORAL | Status: DC | PRN
  Administered 2011-12-14: 10 mg via ORAL
  Filled 2011-12-14: qty 1

## 2011-12-14 MED ORDER — LORAZEPAM 2 MG/ML IJ SOLN
INTRAMUSCULAR | Status: AC
  Filled 2011-12-14: qty 1

## 2011-12-14 MED ORDER — LORAZEPAM 2 MG/ML IJ SOLN
1.0000 mg | Freq: Once | INTRAMUSCULAR | Status: AC
  Administered 2011-12-14: 1 mg via INTRAVENOUS

## 2011-12-14 NOTE — ED Provider Notes (Signed)
History     CSN: [id number]  Arrival date & time 12/11/11  1656   First MD Initiated Contact with Patient 12/11/11 1805      Chief Complaint  Patient presents with  . Sickle Cell Pain Crisis    (Consider location/radiation/quality/duration/timing/severity/associated sxs/prior treatment) HPI Comments: Pt comes in with cc of bilateral wrist pain and left shoulder pain. Pt's pain started after church visit, and is not responding to oral meds. no cough, n/v/f/c. Pt has hx of pain critis in the past, and this pain is similar to previous crisis episodes.  Patient is a 21 y.o. female presenting with sickle cell pain. The history is provided by the patient.  Sickle Cell Pain Crisis  Pertinent negatives include no chest pain, no abdominal pain, no nausea, no vomiting, no dysuria, no headaches and no neck pain.    Past Medical History  Diagnosis Date  . H/O: 1 miscarriage 03/22/2011  . 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 Depression 01/06/2011  . GERD (gastroesophageal reflux disease) 02/17/2011  . Trichotillomania     h/o  . Blood transfusion     "lots"  . Sickle cell anemia with crisis   . Exertional dyspnea     "sometimes"  . Sickle cell anemia   . Headache   . Migraines 11/08/11    "@ least twice/month"  . Chronic back pain     "very severe; have knot in my back; from tight muscle; take RX and exercise for it"  . Mood swings 11/08/11    "I go back and forth; real bad"    Past Surgical History  Procedure Date  . Cholecystectomy 05/2010  . Dilation and curettage of uterus 02/20/11    S/P miscarriage    No family history on file.  History  Substance Use Topics  . Smoking status: Never Smoker   . Smokeless tobacco: Never Used  . Alcohol Use: No    OB History    Grav Para Term Preterm Abortions TAB SAB Ect Mult Living   1    1           Review of Systems  Constitutional: Positive for activity change.  HENT: Negative for neck pain.   Respiratory: Negative for shortness of  breath.   Cardiovascular: Negative for chest pain.  Gastrointestinal: Negative for nausea, vomiting and abdominal pain.  Genitourinary: Negative for dysuria.  Musculoskeletal: Positive for myalgias and arthralgias.  Neurological: Negative for headaches.    Allergies  Toradol  Home Medications   Current Outpatient Rx  Name Route Sig Dispense Refill  . OXYCODONE HCL 10 MG PO TABS Oral Take 1.5 tablets (15 mg total) by mouth 4 (four) times daily as needed. For pain. 60 tablet 0  . PANTOPRAZOLE SODIUM 20 MG PO TBEC Oral Take 1 tablet (20 mg total) by mouth daily. 30 tablet 1    BP 147/65  Pulse 105  Temp 98.3 F (36.8 C) (Oral)  Resp 21  Ht 5\' 3"  (1.6 m)  Wt 179 lb 0.2 oz (81.2 kg)  BMI 31.71 kg/m2  SpO2 95%  LMP 11/29/2011  Physical Exam  Constitutional: She is oriented to person, place, and time. She appears well-developed and well-nourished.  HENT:  Head: Normocephalic and atraumatic.  Eyes: EOM are normal. Pupils are equal, round, and reactive to light.  Neck: Neck supple.  Cardiovascular: Normal rate, regular rhythm and normal heart sounds.   No murmur heard. Pulmonary/Chest: Effort normal. No respiratory distress.  Abdominal: Soft. She exhibits no distension. There  is no tenderness. There is no rebound and no guarding.  Musculoskeletal:       Bilateral wrist exam shoes intact ROM with tenderness to palpation. There is tenderness over ther AC joint as well, no swelling, calor, rubor appreciated. ROM is compromised 2/2 pain.  Neurological: She is alert and oriented to person, place, and time.  Skin: Skin is warm and dry.    ED Course  Procedures (including critical care time)  Labs Reviewed  CBC - Abnormal; Notable for the following:    WBC 14.1 (*)     RBC 3.00 (*)     Hemoglobin 9.7 (*)     HCT 27.5 (*)     RDW 15.6 (*)     Platelets 566 (*)     All other components within normal limits  COMPREHENSIVE METABOLIC PANEL - Abnormal; Notable for the following:      Total Bilirubin 1.5 (*)     All other components within normal limits  RETICULOCYTES - Abnormal; Notable for the following:    Retic Ct Pct 9.9 (*)     RBC. 3.00 (*)     Retic Count, Manual 297.0 (*)     All other components within normal limits  LACTATE DEHYDROGENASE - Abnormal; Notable for the following:    LDH 326 (*)  SLIGHT HEMOLYSIS   All other components within normal limits  CBC - Abnormal; Notable for the following:    WBC 13.9 (*)     RBC 2.83 (*)     Hemoglobin 9.1 (*)     HCT 26.0 (*)     RDW 15.7 (*)     Platelets 565 (*)     All other components within normal limits  COMPREHENSIVE METABOLIC PANEL - Abnormal; Notable for the following:    Total Bilirubin 2.7 (*)     All other components within normal limits  CBC - Abnormal; Notable for the following:    WBC 16.7 (*)     RBC 2.90 (*)     Hemoglobin 9.4 (*)     HCT 26.7 (*)     RDW 16.6 (*)     Platelets 620 (*)     All other components within normal limits  URINALYSIS, ROUTINE W REFLEX MICROSCOPIC   Dg Chest 2 View  12/13/2011  *RADIOLOGY REPORT*  Clinical Data: Sickle cell disease.  Rule out infection.  CHEST - 2 VIEW  Comparison: 12/11/2011  Findings: Mild cardiac enlargement.  No pleural effusion or edema. Mild chronic scarring is noted in both lungs.  No airspace consolidation.  Skeletal changes of sickle cell disease noted.  IMPRESSION:  1.  No evidence for pneumonia or acute chest. 2.  Chronic scarring.  Original Report Authenticated By: Rosealee Albee, M.D.     1. Sickle cell anemia with crisis   2. Avascular necrosis   3. Sickle-cell disease, unspecified       MDM  Pt comes in with cc of sickle cell pain. Pt's lab shows some reticulocytosis and LDH elevation with worsening shoulder xray and evidence of avascular necrosis. Pt required frequent iv meds for pain control, will admit.        Derwood Kaplan, MD 12/14/11 [phone]

## 2011-12-14 NOTE — Progress Notes (Signed)
[facility] Progress Note    Subjective: The patient was seen on rounds today.  The patient is complaining of pain 7/10 in her bilateral LEs, diffuse back pain and left shoulder. Excited about birthday [birth year] she will be 21 planning herself a party at [employer].  Treatment plan is to r/o any infectious process r/t elevated WBC order U/A and chest x-ray explained if everything was negative plan on d/c soon. She verbalize understanding and in agreement.    Objective: Vital signs in last 24 hours: Blood pressure 141/91, pulse 123, temperature 98.3 F (36.8 C), temperature source Oral, resp. rate 22, height 5\' 3"  (1.6 m), weight 81.2 kg (179 lb 0.2 oz), last menstrual period 11/29/2011, SpO2 98.00%.  General Appearance: Crying out of control situational stressors  cooperative, well nourished, well developed, no apparent distress Head: Normocephalic,  sparse hair on head Eyes: PERRLA, EOM's intact, scleral icterus Nose: Nares, septum and mucosa are normal, no drainage or sinus tenderness Throat: Lips, mucosa, and tongue are normal,  teeth and gums normal Back: Symmetric, no curvature, impaired ROM, bilateral CVA tenderness, diffuse tenderness  Cardio: Regular rate and rhythm, S1, S2 normal, tachycardic at times, no murmur/click/rub/gallop GI: Soft, distended, non-tender, hypoactive bowel sounds, no organomegaly GU:  Contact dermatitis/irritation in the lower inguinal/vaginal area, erythema Extremities: Extremities normal, atraumatic, no cyanosis, no edema, Homans sign is negative, tender bilateral LEs, limited ROM bilateral LEs Pulses: 2+ and symmetric Skin: Skin color, texture, turgor normal,  no rashes or lesions, tattoo lower back  Neurologic: Grossly normal, AO x 3, no focal deficits, CN II - XII intact Psych:  appropriate affect  Lab Results: Results for orders placed during the hospital encounter of 12/11/11 (from the past 24 hour(s))  URINALYSIS, ROUTINE W REFLEX  MICROSCOPIC     Status: Normal   Collection Time   12/13/11  9:49 PM      Component Value Range   Color, Urine YELLOW  YELLOW   APPearance CLEAR  CLEAR   Specific Gravity, Urine 1.016  1.005 - 1.030   pH 6.0  5.0 - 8.0   Glucose, UA NEGATIVE  NEGATIVE mg/dL   Hgb urine dipstick NEGATIVE  NEGATIVE   Bilirubin Urine NEGATIVE  NEGATIVE   Ketones, ur NEGATIVE  NEGATIVE mg/dL   Protein, ur NEGATIVE  NEGATIVE mg/dL   Urobilinogen, UA 1.0  0.0 - 1.0 mg/dL   Nitrite NEGATIVE  NEGATIVE   Leukocytes, UA NEGATIVE  NEGATIVE  CBC     Status: Abnormal   Collection Time   12/14/11 11:40 AM      Component Value Range   WBC 14.0 (*) 4.0 - 10.5 K/uL   RBC 2.90 (*) 3.87 - 5.11 MIL/uL   Hemoglobin 9.3 (*) 12.0 - 15.0 g/dL   HCT 54.0 (*) 98.1 - 19.1 %   MCV 92.1  78.0 - 100.0 fL   MCH 32.1  26.0 - 34.0 pg   MCHC 34.8  30.0 - 36.0 g/dL   RDW 47.8 (*) 29.5 - 62.1 %   Platelets 612 (*) 150 - 400 K/uL  COMPREHENSIVE METABOLIC PANEL     Status: Abnormal   Collection Time   12/14/11 11:40 AM      Component Value Range   Sodium 136  135 - 145 mEq/L   Potassium 3.7  3.5 - 5.1 mEq/L   Chloride 102  96 - 112 mEq/L   CO2 26  19 - 32 mEq/L   Glucose, Bld 91  70 -  99 mg/dL   BUN 8  6 - 23 mg/dL   Creatinine, Ser 0.98  0.50 - 1.10 mg/dL   Calcium 9.1  8.4 - 11.9 mg/dL   Total Protein 7.2  6.0 - 8.3 g/dL   Albumin 3.9  3.5 - 5.2 g/dL   AST 24  0 - 37 U/L   ALT 16  0 - 35 U/L   Alkaline Phosphatase 77  39 - 117 U/L   Total Bilirubin 2.1 (*) 0.3 - 1.2 mg/dL   GFR calc non Af Amer >90  >90 mL/min   GFR calc Af Amer >90  >90 mL/min    Studies/Results: Dg Chest 2 View  12/13/2011  *RADIOLOGY REPORT*  Clinical Data: Sickle cell disease.  Rule out infection.  CHEST - 2 VIEW  Comparison: 12/11/2011  Findings: Mild cardiac enlargement.  No pleural effusion or edema. Mild chronic scarring is noted in both lungs.  No airspace consolidation.  Skeletal changes of sickle cell disease noted.  IMPRESSION:  1.  No  evidence for pneumonia or acute chest. 2.  Chronic scarring.  Original Report Authenticated By: Rosealee Albee, M.D.     Medications: Allergies  Allergen Reactions  . Toradol (Ketorolac Tromethamine) Hives and Itching    Current Facility-Administered Medications  Medication Dose Route Frequency Provider Last Rate Last Dose  . 0.45 % sodium chloride infusion   Intravenous Continuous Sorin Luanne Bras, MD 50 mL/hr at 12/14/11 1048    . ALPRAZolam Prudy Feeler) tablet 0.5 mg  0.5 mg Oral TID PRN Sorin Luanne Bras, MD      . cholecalciferol (VITAMIN D) tablet 1,000 Units  1,000 Units Oral Daily Sorin Luanne Bras, MD   1,000 Units at 12/14/11 1042  . cyclobenzaprine (FLEXERIL) tablet 5 mg  5 mg Oral TID PRN Sorin Luanne Bras, MD      . diphenhydrAMINE (BENADRYL) capsule 25-50 mg  25-50 mg Oral Q4H PRN Sorin Luanne Bras, MD       Or  . diphenhydrAMINE (BENADRYL) injection 12.5-25 mg  12.5-25 mg Intravenous Q4H PRN Sorin Luanne Bras, MD   25 mg at 12/14/11 1048  . enoxaparin (LOVENOX) injection 40 mg  40 mg Subcutaneous QHS Sorin Luanne Bras, MD   40 mg at 12/13/11 2201  . fluvoxaMINE (LUVOX) tablet 50 mg  50 mg Oral QHS Sorin Luanne Bras, MD   50 mg at 12/12/11 2109  . folic acid (FOLVITE) tablet 1 mg  1 mg Oral Daily Sorin Luanne Bras, MD   1 mg at 12/14/11 1042  . HYDROmorphone (DILAUDID) injection 4 mg  4 mg Intravenous Q2H PRN Gwenyth Bender, MD   4 mg at 12/14/11 1338  . hydroxyurea (HYDREA) capsule 1,000 mg  1,000 mg Oral Daily Sorin Luanne Bras, MD   1,000 mg at 12/14/11 1041  . lidocaine (LIDODERM) 5 % 1 patch  1 patch Transdermal QHS Sorin Luanne Bras, MD   1 patch at 12/12/11 0119  . LORazepam (ATIVAN) injection 1 mg  1 mg Intravenous Once Grayce Sessions, NP      . ondansetron [facility]) tablet 8 mg  8 mg Oral Q4H PRN Sorin Luanne Bras, MD   8 mg at 12/13/11 1346  . oxyCODONE (Oxy IR/ROXICODONE) immediate release tablet 10 mg  10 mg Oral QID PRN Sorin Luanne Bras, MD      . pantoprazole (PROTONIX) EC tablet 20 mg  20 mg Oral Once Grayce Sessions, NP    20 mg at 12/13/11 1753  .  senna (SENOKOT) tablet 8.6 mg  1 tablet Oral BID Sorin C Lavera Guise, MD   8.6 mg at 12/14/11 1339  . zolpidem (AMBIEN) tablet 5 mg  5 mg Oral QHS PRN,MR X 1 Sorin Luanne Bras, MD        Assessment/Plan: Patient Active Problem List  Diagnosis  . SICKLE CELL ANEMIA  . TRICHOTILLOMANIA  . Active smoker  . Back pain  . Depression  . GERD (gastroesophageal reflux disease)  . Contraception management  . Stress  . Overweight  . Vaso-occlusive sickle cell crisis  . Right thigh pain  . Vaginosis  . Tachycardia    Sickle Cell Crisis:  The patient is in active hemolysis  The patient is receiving pain/nausea/pruritis/bowel management, IV hydration, Folic Acid, Hydroxyurea and GI/DVT prophylaxis.  Patient cont to have  bilateral LE pain and chronic back pain.  CMP and CBC daily  Trichotillomania/Anxiety/Depression/possible ADHD:  Luvox 50 mg PO QHS. GERD:  Protonix BID Leukocytosis; frequent IS usage is encouraged.  Tachycardia:  Metoprolol Succinate daily   Discussed d/c planning in 24hrs -48 hrs  Gwinda Passe, NP-C 12/14/2011, 3:21 PM

## 2011-12-14 NOTE — H&P (Signed)
[facility] History and Physical   Date: 12/14/2011  Patient name: [name] Medical record number: [id number] Date of birth: 12/09/1990 Age: 21 y.o. Gender: female PCP: August Saucer ERIC, MD  Attending physician: Gwenyth Bender, MD  Chief Complaint:Shoulder pain and inability to deal with life stressors   History of Present Illness: [name] required time to transition from hospital to home. Admitted to allow a more conducive transition. Pain remains in her shoulders labs also indicate she is in active hemolysis.   Meds: Prescriptions prior to admission  Medication Sig Dispense Refill  . ALPRAZolam (XANAX) 0.25 MG tablet Take 1-2 tablets (0.25-0.5 mg total) by mouth every 3 (three) hours as needed for anxiety.  60 tablet  1  . cholecalciferol (VITAMIN D) 1000 UNITS tablet Take 1,000 Units by mouth daily.      . cyclobenzaprine (FLEXERIL) 5 MG tablet Take 5 mg by mouth 2 (two) times daily as needed. muscle spasms.       . fluvoxaMINE (LUVOX) 50 MG tablet Take 1 tablet (50 mg total) by mouth at bedtime.  30 tablet  1  . hydroxyurea (HYDREA) 500 MG capsule Take 1,000 mg by mouth daily. May take with food to minimize GI side effects.      . metoprolol succinate (TOPROL-XL) 12.5 mg TB24 Take 0.5 tablets (12.5 mg total) by mouth daily.  60 tablet  0  . pantoprazole (PROTONIX) 20 MG tablet Take 1 tablet (20 mg total) by mouth daily.  30 tablet  1  . ondansetron (ZOFRAN) 8 MG tablet Take 8 mg by mouth every 4 (four) hours as needed.      . Oxycodone HCl 10 MG TABS Take 1.5 tablets (15 mg total) by mouth 4 (four) times daily as needed. For pain.  60 tablet  0  . promethazine (PHENERGAN) 25 MG tablet Take 1 tablet (25 mg total) by mouth every 6 (six) hours as needed for nausea.  30 tablet  1  . promethazine (PHENERGAN) 25 MG tablet Take 1 tablet (25 mg total) by mouth every 4 (four) hours as needed for nausea.  50 tablet  1    Allergies: Toradol Past Medical History  Diagnosis  Date  . H/O: 1 miscarriage 03/22/2011  . 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 Depression 01/06/2011  . GERD (gastroesophageal reflux disease) 02/17/2011  . Trichotillomania     h/o  . Blood transfusion     "lots"  . Sickle cell anemia with crisis   . Exertional dyspnea     "sometimes"  . Sickle cell anemia   . Headache   . Migraines 11/08/11    "@ least twice/month"  . Chronic back pain     "very severe; have knot in my back; from tight muscle; take RX and exercise for it"  . Mood swings 11/08/11    "I go back and forth; real bad"   Past Surgical History  Procedure Date  . Cholecystectomy 05/2010  . Dilation and curettage of uterus 02/20/11    S/P miscarriage   No family history on file. History   Social History  . Marital Status: Single    Spouse Name: N/A    Number of Children: N/A  . Years of Education: N/A   Occupational History  . Not on file.   Social History Main Topics  . Smoking status: Never Smoker   . Smokeless tobacco: Never Used  . Alcohol Use: No  . Drug Use: Yes    Special: Marijuana  11/08/11 "smoked 2 joints/day of marijuana; quit 1 month ago"  . Sexually Active: Yes   Other Topics Concern  . Not on file   Social History Narrative  . No narrative on file    Review of Systems: Pertinent items are noted in HPI.  Physical Exam: Blood pressure 122/64, pulse 106, temperature 97.9 F (36.6 C), temperature source Oral, resp. rate 16, last menstrual period 11/29/2011, SpO2 100.00%.  General Appearance: Continues to Cry out of control situational stressors stating I really try to be a good person just some people in my life are not good very  cooperative, well nourished, well developed,  No acute distress Head: Normocephalic, sparse hair on head  Eyes: PERRLA, EOM's intact, scleral icterus  Nose: Nares, septum and mucosa are normal, no drainage or sinus tenderness  Throat: Lips, mucosa, and tongue are normal, teeth and gums normal  Back: Symmetric, no  curvature, impaired ROM, bilateral CVA tenderness, diffuse tenderness  Cardio: Regular rate and rhythm, S1, S2 normal, tachycardic at times, no murmur/click/rub/gallop  GI: Soft, distended, non-tender, hypoactive bowel sounds, no organomegaly  Extremities: Extremities normal, atraumatic, no cyanosis, no edema, Homans sign is negative, tender bilateral LEs, limited ROM bilateral LEs  Pulses: 2+ and symmetric  Skin: Skin color, texture, turgor normal, no rashes or lesions, tattoo lower back  Neurologic: Grossly normal, AO x 3, no focal deficits, CN II - XII intact  Psych:depressed    Lab results: Results for orders placed during the hospital encounter of 12/11/11 (from the past 24 hour(s))  URINALYSIS, ROUTINE W REFLEX MICROSCOPIC     Status: Normal   Collection Time   12/13/11  9:49 PM      Component Value Range   Color, Urine YELLOW  YELLOW   APPearance CLEAR  CLEAR   Specific Gravity, Urine 1.016  1.005 - 1.030   pH 6.0  5.0 - 8.0   Glucose, UA NEGATIVE  NEGATIVE mg/dL   Hgb urine dipstick NEGATIVE  NEGATIVE   Bilirubin Urine NEGATIVE  NEGATIVE   Ketones, ur NEGATIVE  NEGATIVE mg/dL   Protein, ur NEGATIVE  NEGATIVE mg/dL   Urobilinogen, UA 1.0  0.0 - 1.0 mg/dL   Nitrite NEGATIVE  NEGATIVE   Leukocytes, UA NEGATIVE  NEGATIVE  CBC     Status: Abnormal   Collection Time   12/14/11 11:40 AM      Component Value Range   WBC 14.0 (*) 4.0 - 10.5 K/uL   RBC 2.90 (*) 3.87 - 5.11 MIL/uL   Hemoglobin 9.3 (*) 12.0 - 15.0 g/dL   HCT 96.0 (*) 45.4 - 09.8 %   MCV 92.1  78.0 - 100.0 fL   MCH 32.1  26.0 - 34.0 pg   MCHC 34.8  30.0 - 36.0 g/dL   RDW 11.9 (*) 14.7 - 82.9 %   Platelets 612 (*) 150 - 400 K/uL  COMPREHENSIVE METABOLIC PANEL     Status: Abnormal   Collection Time   12/14/11 11:40 AM      Component Value Range   Sodium 136  135 - 145 mEq/L   Potassium 3.7  3.5 - 5.1 mEq/L   Chloride 102  96 - 112 mEq/L   CO2 26  19 - 32 mEq/L   Glucose, Bld 91  70 - 99 mg/dL   BUN 8  6 - 23  mg/dL   Creatinine, Ser 5.62  0.50 - 1.10 mg/dL   Calcium 9.1  8.4 - 13.0 mg/dL   Total Protein 7.2  6.0 - 8.3 g/dL   Albumin 3.9  3.5 - 5.2 g/dL   AST 24  0 - 37 U/L   ALT 16  0 - 35 U/L   Alkaline Phosphatase 77  39 - 117 U/L   Total Bilirubin 2.1 (*) 0.3 - 1.2 mg/dL   GFR calc non Af Amer >90  >90 mL/min   GFR calc Af Amer >90  >90 mL/min    Imaging results:  Dg Chest 2 View  12/13/2011  *RADIOLOGY REPORT*  Clinical Data: Sickle cell disease.  Rule out infection.  CHEST - 2 VIEW  Comparison: 12/11/2011  Findings: Mild cardiac enlargement.  No pleural effusion or edema. Mild chronic scarring is noted in both lungs.  No airspace consolidation.  Skeletal changes of sickle cell disease noted.  IMPRESSION:  1.  No evidence for pneumonia or acute chest. 2.  Chronic scarring.  Original Report Authenticated By: Rosealee Albee, M.D.     Assessment & Plan: Patient Active Hospital Problem List: Sickle cell crisis with active hemolysis: will prepare to transition to home by decreasing IV pain medication in hopes to control pain via oral, continue hydration, bowel, pruritus and antimeric management  Depression/pysch social stressor : clinical SW will evaluate in AM    EDWARDS, MICHELLE P 12/14/2011, 9:35 PM

## 2011-12-14 NOTE — Progress Notes (Signed)
Patient's breakfast tray arrived with two sugar cookies, Scrambled eggs and sausage.  Patient was advised that sugar cookies were not a healthy meal choice for a breakfast tray, and not a recommended dietary choice for a sickle cell patient in that quantity.  Patient shows no signs of accepting education.

## 2011-12-14 NOTE — Progress Notes (Signed)
The boyfriend of pt in rm [id number] called last night to the front desk demanding to talk to the RN taking care of his girlfriend. Darlene, the Diplomatic Services operational officer, told him  that  This RN was busy.  Boyfriend then started cursing at the Diplomatic Services operational officer .Boyfriend was advised that if he calls again the security will be notified.   Later in the shift, [facility] police arrived on the floor.  They stated that Security had asked them to retrieve a cell phone charger from the patient.  According to the police, the Security had received several "harassing" phone calls from the patient.  Patient denied that the phone calls were harassing in nature.  The purpose of the calls to Security were to transfer the cell phone charger from the patient to the boyfriend.  RN advised the patient that it was not appropriate to make repeated phone calls to Security.

## 2011-12-14 NOTE — Discharge Summary (Signed)
Discharge Summary  Patient ID: [name] MRN: [id number] DOB/AGE: March 24, 1991 21 y.o.  Admit date: 12/14/2011 Discharge date: 12/14/2011   Discharge Diagnoses:   Sickle cell crisis. 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 Anxiety disorder. GERD Depression Leucocytosis   Discharged Condition: Guarded   Operations/Procedues: none   Hospital Course: [name] was admitted for further control of her pain. She received hydration  with IVF,  Dilaudid,  zofran and benadryl all IV as needed. No evidence for infection found U/A and chest x-ray negative. She has  multiple psychosocial issues which contributed to her crisis. She will be receiving  continued supportive measures, her pain level has not subsided. She will need to be transferred/ d/c and admitted  to [facility] for more favorable transition to home .   Significant Diagnostic Studies: none.   Disposition: 01-Home or Self Care BP 122/64  Pulse 106  Temp 97.9 F (36.6 C) (Oral)  Resp 16  SpO2 100%  LMP 11/29/2011 General Appearance: Crying out of control situational stressors cooperative, well nourished, well developed, no apparent distress  Head: Normocephalic, sparse hair on head  Eyes: PERRLA, EOM's intact, scleral icterus  Cardio: Regular rate and rhythm, S1, S2 normal, tachycardic at times, no murmur/click/rub/gallop  GI: Soft, distended, non-tender, hypoactive bowel sounds, no organomegaly  Extremities: Extremities normal, atraumatic, no cyanosis, no edema, Homans sign is negative, tender bilateral LEs, limited ROM bilateral LEs  Pulses: 2+ and symmetric  Skin: Skin color, texture, turgor normal, no rashes or lesions, tattoo lower back  Neurologic: Grossly normal, AO x 3, no focal deficits, CN II - XII intact  Psych:depressed   Medication List  As of 12/14/2011  9:28 PM   ASK your doctor about these medications         ALPRAZolam 0.25 MG tablet   Commonly known as: XANAX   Take 1-2 tablets (0.25-0.5 mg  total) by mouth every 3 (three) hours as needed for anxiety.      cholecalciferol 1000 UNITS tablet   Commonly known as: VITAMIN D   Take 1,000 Units by mouth daily.      cyclobenzaprine 5 MG tablet   Commonly known as: FLEXERIL   Take 5 mg by mouth 2 (two) times daily as needed. muscle spasms.      fluvoxaMINE 50 MG tablet   Commonly known as: LUVOX   Take 1 tablet (50 mg total) by mouth at bedtime.      hydroxyurea 500 MG capsule   Commonly known as: HYDREA   Take 1,000 mg by mouth daily. May take with food to minimize GI side effects.      metoprolol succinate 12.5 mg Tb24   Commonly known as: TOPROL-XL   Take 0.5 tablets (12.5 mg total) by mouth daily.      ondansetron 8 MG tablet   Commonly known as: ZOFRAN   Take 8 mg by mouth every 4 (four) hours as needed.      Oxycodone HCl 10 MG Tabs   Take 1.5 tablets (15 mg total) by mouth 4 (four) times daily as needed. For pain.      pantoprazole 20 MG tablet   Commonly known as: PROTONIX   Take 1 tablet (20 mg total) by mouth daily.      promethazine 25 MG tablet   Commonly known as: PHENERGAN   Take 1 tablet (25 mg total) by mouth every 6 (six) hours as needed for nausea.      promethazine 25 MG tablet  Commonly known as: PHENERGAN   Take 1 tablet (25 mg total) by mouth every 4 (four) hours as needed for nausea.           Discharge time greater than 45 minutes.  SignedGrayce Sessions 12/14/2011, 9:28 PM

## 2011-12-15 MED ORDER — MORPHINE SULFATE ER 15 MG PO TBCR: 15.0000 mg | EXTENDED_RELEASE_TABLET | Freq: Two times a day (BID) | ORAL | Status: DC

## 2011-12-15 NOTE — Progress Notes (Signed)
Patient ID: [name], female   DOB: March 19, 1991, 21 y.o.   MRN: [id number] 13:30   Patient d/c from sickle cell unit via ambulatory. No complications, vitals stable. D/c instructions given, labs not drawn due to patient had ride issues and could not wait on IV team to access.

## 2011-12-15 NOTE — Discharge Summary (Signed)
Sickle Cell Medical Discharge Summary   Patient ID: [name] MRN: [id number] DOB/AGE: Nov 07, 1990 21 y.o.  Admit date: 12/14/2011 Discharge date: 12/15/2011  Primary Care Physician:  Dr. Willey Blade   Admission Diagnoses:  Active Problems: Sickle cell crisis Depression  Tachycardia   Discharge Diagnoses:   Sickle cell crisis Depression  Tachycardia     Discharge Medications:  Medication List  As of 12/15/2011 12:57 PM   STOP taking these medications         ondansetron 8 MG tablet         TAKE these medications         ALPRAZolam 0.25 MG tablet   Commonly known as: XANAX   Take 1-2 tablets (0.25-0.5 mg total) by mouth every 3 (three) hours as needed for anxiety.      cholecalciferol 1000 UNITS tablet   Commonly known as: VITAMIN D   Take 1,000 Units by mouth daily.      cyclobenzaprine 5 MG tablet   Commonly known as: FLEXERIL   Take 5 mg by mouth 2 (two) times daily as needed. muscle spasms.      fluvoxaMINE 50 MG tablet   Commonly known as: LUVOX   Take 1 tablet (50 mg total) by mouth at bedtime.      hydroxyurea 500 MG capsule   Commonly known as: HYDREA   Take 1,000 mg by mouth daily. May take with food to minimize GI side effects.      metoprolol succinate 12.5 mg Tb24   Commonly known as: TOPROL-XL   Take 0.5 tablets (12.5 mg total) by mouth daily.      morphine 15 MG 12 hr tablet   Commonly known as: MS CONTIN   Take 1 tablet (15 mg total) by mouth 2 (two) times daily.      Oxycodone HCl 10 MG Tabs   Take 1.5 tablets (15 mg total) by mouth 4 (four) times daily as needed. For pain.      pantoprazole 20 MG tablet   Commonly known as: PROTONIX   Take 1 tablet (20 mg total) by mouth daily.      promethazine 25 MG tablet   Commonly known as: PHENERGAN   Take 1 tablet (25 mg total) by mouth every 6 (six) hours as needed for nausea.      promethazine 25 MG tablet   Commonly known as: PHENERGAN   Take 1 tablet (25 mg total) by mouth every 4  (four) hours as needed for nausea.             Consults:  None   Significant Diagnostic Studies:  Dg Chest 2 View  11/21/2011  *RADIOLOGY REPORT*  Clinical Data: 21 year old female with sickle cell disease.  Right lower extremity pain.  CHEST - 2 VIEW  Comparison: 10/06/2011 and earlier.  Findings: Stable lung volumes.  Cardiac size and mediastinal contours are within normal limits.  Visualized tracheal air column is within normal limits.  No pneumothorax, pulmonary edema, pleural effusion or acute pulmonary opacity.  Interval decreased vascularity.  Stable mild right suprahilar scarring.  Bony changes of sickle cell disease throughout the visible spine.  IMPRESSION: No acute cardiopulmonary abnormality.  Stable sequelae of sickle cell disease.  Original Report Authenticated By: Harley Hallmark, M.D.   Dg Hip Complete Right  11/24/2011  *RADIOLOGY REPORT*  Clinical Data: Right hip and groin pain.  Sickle cell crisis.  RIGHT HIP - COMPLETE 2+ VIEW  Comparison: CT pelvis 11/08/2011 and right  hip series 03/15/2011.  Findings: No acute osseous or joint abnormality.  A sclerotic lesion at the junction of the left femoral head and neck is unchanged and may represent a bone infarct.  IMPRESSION: No acute findings appear  Original Report Authenticated By: Reyes Ivan, M.D.   Ct Femur Right Wo Contrast  11/08/2011  *RADIOLOGY REPORT*  Clinical Data: Sickle cell crisis.  Severe right thigh pain.  CT OF THE RIGHT FEMUR WITHOUT CONTRAST  Comparison: 06/21/2011.  Findings: Vague sclerosis is present in the distal femoral metaphysis.  In a patient with history of sickle cell anemia, this could represent small area of infarction.  There is no AVN.  Mild subchondral sclerosis of the right acetabular roof could be associated with old bone infarct as well.  Soft tissues appear normal and symmetric.  There is no soft tissue fluid collection or inflammatory change.  Muscular compartments appear within normal  limits.  No distortion of fat planes.  The right thigh appears symmetric when compared to the left thigh. There is no inguinal adenopathy.  The visualized visceral pelvis appears within normal limits.  IMPRESSION:  1.  Normal soft tissues of the right thigh for noncontrast CT. 2.  Small areas of sclerosis in the right acetabular roof and distal femoral metaphysis may represent small bone infarcts in a patient with history of sickle cell anemia.  Original Report Authenticated By: Andreas Newport, M.D.     [facility] Course:  For complete details please refer to admission H and P, but in brief, [name]  Presented to the ED with similar sickle cell crisis pain noted elevated retctic count and leukocytosis . She was transition to the Sickle cell center for further observation and to transition to home.   BP 112/70  Pulse 107  Temp 99.2 F (37.3 C) (Oral)  Resp 18  SpO2 100%  LMP 11/29/2011 General Appearance: cooperative, well nourished, well developed, no apparent distress  Cardio: Regular rate and rhythm, S1, S2 normal, tachycardic at times, no murmur/click/rub/gallop  GI: Soft, distended, non-tender, hypoactive bowel sounds, no organomegaly  Extremities: Extremities normal, atraumatic, no cyanosis, no edema, Homans sign is negative, tender bilateral LEs, limited ROM bilateral LEs  Pulses: 2+ and symmetric   Disposition at Discharge: 01-Home and Self Care  Discharge Orders: See Discharge instructions   Condition at Discharge:   Stable  Time spent on Discharge:  Greater than 30 minutes.  SignedRanda Evens, MICHELLE P 12/15/2011, 12:57 PM

## 2011-12-16 ENCOUNTER — Inpatient Hospital Stay (HOSPITAL_COMMUNITY)
Admission: EM | Admit: 2011-12-16 | Discharge: 2011-12-22 | DRG: 812 | Disposition: A | Discharge status: Home / Self Care | Payer: Medicaid Other | Attending: Internal Medicine | Admitting: Internal Medicine

## 2011-12-16 ENCOUNTER — Encounter (HOSPITAL_COMMUNITY): Payer: Self-pay | Admitting: Emergency Medicine

## 2011-12-16 DIAGNOSIS — F329 Major depressive disorder, single episode, unspecified: Secondary | ICD-10-CM | POA: Diagnosis present

## 2011-12-16 DIAGNOSIS — R000 Tachycardia, unspecified: Secondary | ICD-10-CM | POA: Diagnosis present

## 2011-12-16 DIAGNOSIS — D5700 Hb-SS disease with crisis, unspecified: Principal | ICD-10-CM

## 2011-12-16 DIAGNOSIS — D571 Sickle-cell disease without crisis: Secondary | ICD-10-CM

## 2011-12-16 DIAGNOSIS — M549 Dorsalgia, unspecified: Secondary | ICD-10-CM

## 2011-12-16 DIAGNOSIS — M8708 Idiopathic aseptic necrosis of bone, other site: Secondary | ICD-10-CM | POA: Diagnosis present

## 2011-12-16 DIAGNOSIS — F17200 Nicotine dependence, unspecified, uncomplicated: Secondary | ICD-10-CM | POA: Diagnosis present

## 2011-12-16 DIAGNOSIS — K219 Gastro-esophageal reflux disease without esophagitis: Secondary | ICD-10-CM

## 2011-12-16 DIAGNOSIS — M79609 Pain in unspecified limb: Secondary | ICD-10-CM | POA: Diagnosis present

## 2011-12-16 DIAGNOSIS — D72829 Elevated white blood cell count, unspecified: Secondary | ICD-10-CM | POA: Diagnosis present

## 2011-12-16 DIAGNOSIS — Z79899 Other long term (current) drug therapy: Secondary | ICD-10-CM

## 2011-12-16 DIAGNOSIS — F3289 Other specified depressive episodes: Secondary | ICD-10-CM | POA: Diagnosis present

## 2011-12-16 DIAGNOSIS — F6389 Other impulse disorders: Secondary | ICD-10-CM | POA: Diagnosis present

## 2011-12-16 DIAGNOSIS — F411 Generalized anxiety disorder: Secondary | ICD-10-CM | POA: Diagnosis present

## 2011-12-16 LAB — CBC
MCH: 33 pg (ref 26.0–34.0)
MCV: 93.7 fL (ref 78.0–100.0)
Platelets: 595 10*3/uL — ABNORMAL HIGH (ref 150–400)
RDW: 18.1 % — ABNORMAL HIGH (ref 11.5–15.5)

## 2011-12-16 LAB — RETICULOCYTES
RBC.: 3.15 MIL/uL — ABNORMAL LOW (ref 3.87–5.11)
Retic Count, Absolute: 541.8 10*3/uL — ABNORMAL HIGH (ref 19.0–186.0)

## 2011-12-16 LAB — COMPREHENSIVE METABOLIC PANEL
ALT: 17 U/L (ref 0–35)
AST: 27 U/L (ref 0–37)
Albumin: 4.1 g/dL (ref 3.5–5.2)
Calcium: 9.7 mg/dL (ref 8.4–10.5)
Creatinine, Ser: 0.59 mg/dL (ref 0.50–1.10)
GFR calc non Af Amer: >90 mL/min (ref >90)
Sodium: 138 mEq/L (ref 135–145)
Total Protein: 8.2 g/dL (ref 6.0–8.3)

## 2011-12-16 MED ORDER — ONDANSETRON HCL 4 MG/2ML IJ SOLN: 4.0000 mg | Freq: Four times a day (QID) | INTRAMUSCULAR | Status: DC | PRN

## 2011-12-16 MED ORDER — HYDROMORPHONE HCL PF 1 MG/ML IJ SOLN
1.0000 mg | INTRAMUSCULAR | Status: DC | PRN
  Administered 2011-12-17 (×3): 1 mg via INTRAVENOUS
  Filled 2011-12-16 (×3): qty 1

## 2011-12-16 MED ORDER — SODIUM CHLORIDE 0.9 % IV SOLN: 1000.0000 mL | INTRAVENOUS | Status: DC

## 2011-12-16 MED ORDER — DIPHENHYDRAMINE HCL 50 MG/ML IJ SOLN: 12.5000 mg | Freq: Four times a day (QID) | INTRAMUSCULAR | Status: DC | PRN

## 2011-12-16 MED ORDER — ONDANSETRON HCL 4 MG/2ML IJ SOLN
4.0000 mg | Freq: Once | INTRAMUSCULAR | Status: AC
  Administered 2011-12-16: 4 mg via INTRAVENOUS
  Filled 2011-12-16: qty 2

## 2011-12-16 MED ORDER — SODIUM CHLORIDE 0.9 % IV SOLN
INTRAVENOUS | Status: DC
  Administered 2011-12-16: via INTRAVENOUS
  Administered 2011-12-17: 1000 mL via INTRAVENOUS

## 2011-12-16 MED ORDER — HYDROMORPHONE HCL PF 1 MG/ML IJ SOLN
1.0000 mg | Freq: Once | INTRAMUSCULAR | Status: AC
  Administered 2011-12-16: 1 mg via INTRAVENOUS
  Filled 2011-12-16: qty 1

## 2011-12-16 NOTE — ED Provider Notes (Addendum)
Medical screening examination/treatment/procedure(s) were conducted as a shared visit with non-physician practitioner(s) and myself.  I personally evaluated the patient during the encounter  Nancy Chick, MD 12/16/11 2359   Pt seen primarily by me.  CC- leg pain, sickle cell crisis HPI- patient with history of sickle cell disease presenting with bilateral pain in her thighs. She states that this is similar to her prior sickle cell pain crises. She was recently admitted and discharged from [facility] for a pain crisis. She states that upon her discharge she felt improved but last night began to have recurrence of her pain. She tried taking oxycodone at home, 2 doses without much relief of her pain. She denies any fever or. She's had no chest pain or cough. She's had no nausea vomiting or diarrhea. She states that she called the sickle cell clinic today and was told to come to the emergency department.  There are no other associated systemic symptoms, there are no other alleviating or modifying factors.   ROS:  ROS reviewed and all otherwise negative except for mentioned in HPI PMHX- sickle cell anemia, migraines, chronic back pain Soc Hx- denies drinking etoh or illicit drug use FamHx- sickle cell  PE:  Gen:- awake, alert, NAD HEENT- no conjucntival injection, no scleral icterus, MMM, OP clear Neck- supple, FROM, no sig LAD Chest- CTAB, BSS, no increased respiratory effort CV- regular, tachycardic, no m/r/g Abd- soft, NT, ND, NABS Extremities- diffuse ttp over bilateral thighs, no cyanosis/clubbing or edema, no joint tenderness Skin- brisk cap refill, no rash  ED course:  Pt with elevated retic count.  She states she is hoping to be discharged after pain medications.  Her HGB is at baseline.  Dilaudid ordered for pain.  She is moved to CDU under sickle cell pain protocol and will be discharged if symptoms are improved after meds.  Impression- sickle cell pain crisis.    Nancy Chick, MD 12/17/11 0005

## 2011-12-16 NOTE — ED Notes (Signed)
Pt c/o bilateral leg pain from sickle cell; pt placed on O2 for comfort; pt seen at [facility] for same yesterday

## 2011-12-16 NOTE — ED Notes (Signed)
Post two attempts to start IV called IV team.

## 2011-12-16 NOTE — ED Provider Notes (Signed)
11:30 PM Assumed care of patient in the CDU from Dr. Karma Ganja.  Patient is currently on the Sickle Cell Protocol.  Patient with history of Sickle Cell Anemia comes in today with a chief complaint of bilateral leg pain.  She reports that this pain is her typical sickle cell crisis pain.  Plan is for the patient to receive IV fluids and IV pain medications and discharge home once the pain is tolerable.  Reassessed patient.  She reports that her pain has not improved at this time.  IV team here to start an IV.  Patient alert and orientated x 3, Heart RRR, Lungs CTAB, Abdomen soft and nontender, Distal pulses intact.  Pascal Lux [place], PA-C 12/16/11 2351

## 2011-12-16 NOTE — ED Notes (Signed)
Pt. Reports lower extremity pain. Pt. States "It's mostly in my right thigh, but my whole right leg feels numb/tingling". Pt reports difficulty walking. Pedal pulses strong, cap refill instant. Pt. Was seen at [facility] yesterday for same. Pt. Also reports SOB and difficulty breathing upon awakening this AM. Vital signs WNL.

## 2011-12-17 ENCOUNTER — Inpatient Hospital Stay (HOSPITAL_COMMUNITY): Payer: Medicaid Other

## 2011-12-17 ENCOUNTER — Encounter (HOSPITAL_COMMUNITY): Payer: Self-pay | Admitting: *Deleted

## 2011-12-17 DIAGNOSIS — M549 Dorsalgia, unspecified: Secondary | ICD-10-CM

## 2011-12-17 DIAGNOSIS — D5700 Hb-SS disease with crisis, unspecified: Principal | ICD-10-CM

## 2011-12-17 DIAGNOSIS — K219 Gastro-esophageal reflux disease without esophagitis: Secondary | ICD-10-CM

## 2011-12-17 DIAGNOSIS — D571 Sickle-cell disease without crisis: Secondary | ICD-10-CM

## 2011-12-17 LAB — MAGNESIUM: Magnesium: 1.8 mg/dL (ref 1.5–2.5)

## 2011-12-17 LAB — CARDIAC PANEL(CRET KIN+CKTOT+MB+TROPI)
Relative Index: INVALID (ref 0.0–2.5)
Total CK: 37 U/L (ref 7–177)

## 2011-12-17 LAB — PHOSPHORUS: Phosphorus: 4.4 mg/dL (ref 2.3–4.6)

## 2011-12-17 MED ORDER — HYDROMORPHONE HCL PF 1 MG/ML IJ SOLN
1.0000 mg | Freq: Once | INTRAMUSCULAR | Status: AC
  Administered 2011-12-17: 1 mg via INTRAVENOUS
  Filled 2011-12-17: qty 1

## 2011-12-17 MED ORDER — PROMETHAZINE HCL 12.5 MG RE SUPP: 12.5000 mg | RECTAL | Status: DC | PRN

## 2011-12-17 MED ORDER — ALPRAZOLAM 0.25 MG PO TABS: 0.2500 mg | ORAL_TABLET | ORAL | Status: DC | PRN

## 2011-12-17 MED ORDER — METOPROLOL SUCCINATE 12.5 MG HALF TABLET
12.5000 mg | ORAL_TABLET | Freq: Every day | ORAL | Status: DC
  Administered 2011-12-17 – 2011-12-22 (×6): 12.5 mg via ORAL
  Filled 2011-12-17 (×6): qty 1

## 2011-12-17 MED ORDER — CYCLOBENZAPRINE HCL 10 MG PO TABS
5.0000 mg | ORAL_TABLET | Freq: Two times a day (BID) | ORAL | Status: DC | PRN
  Administered 2011-12-18: 5 mg via ORAL
  Filled 2011-12-17: qty 1

## 2011-12-17 MED ORDER — VITAMIN D3 25 MCG (1000 UNIT) PO TABS
1000.0000 [IU] | ORAL_TABLET | Freq: Every day | ORAL | Status: DC
  Administered 2011-12-17 – 2011-12-22 (×6): 1000 [IU] via ORAL
  Filled 2011-12-17 (×6): qty 1

## 2011-12-17 MED ORDER — SODIUM CHLORIDE 0.9 % IV SOLN
INTRAVENOUS | Status: DC
  Administered 2011-12-18: 100 mL via INTRAVENOUS
  Administered 2011-12-19: 1000 mL via INTRAVENOUS
  Administered 2011-12-20: 100 mL/h via INTRAVENOUS

## 2011-12-17 MED ORDER — ONDANSETRON HCL 4 MG/2ML IJ SOLN: 4.0000 mg | Freq: Three times a day (TID) | INTRAMUSCULAR | Status: DC | PRN

## 2011-12-17 MED ORDER — PANTOPRAZOLE SODIUM 20 MG PO TBEC
20.0000 mg | DELAYED_RELEASE_TABLET | Freq: Every day | ORAL | Status: DC
  Administered 2011-12-17 – 2011-12-22 (×6): 20 mg via ORAL
  Filled 2011-12-17 (×6): qty 1

## 2011-12-17 MED ORDER — HYDROMORPHONE HCL PF 1 MG/ML IJ SOLN
2.0000 mg | INTRAMUSCULAR | Status: DC | PRN
  Administered 2011-12-17 – 2011-12-20 (×21): 2 mg via INTRAVENOUS
  Filled 2011-12-17 (×2): qty 2
  Filled 2011-12-17: qty 1
  Filled 2011-12-17 (×17): qty 2
  Filled 2011-12-17: qty 1
  Filled 2011-12-17 (×4): qty 2

## 2011-12-17 MED ORDER — HYDROMORPHONE HCL PF 1 MG/ML IJ SOLN: 1.0000 mg | INTRAMUSCULAR | Status: DC | PRN

## 2011-12-17 MED ORDER — ENOXAPARIN SODIUM 30 MG/0.3ML ~~LOC~~ SOLN
30.0000 mg | SUBCUTANEOUS | Status: DC
  Administered 2011-12-17 – 2011-12-21 (×3): 30 mg via SUBCUTANEOUS
  Filled 2011-12-17 (×6): qty 0.3

## 2011-12-17 MED ORDER — SODIUM CHLORIDE 0.9 % IV SOLN: INTRAVENOUS | Status: DC

## 2011-12-17 MED ORDER — FOLIC ACID 1 MG PO TABS
1.0000 mg | ORAL_TABLET | Freq: Every day | ORAL | Status: DC
  Administered 2011-12-17 – 2011-12-22 (×6): 1 mg via ORAL
  Filled 2011-12-17 (×6): qty 1

## 2011-12-17 MED ORDER — HYDROCODONE-ACETAMINOPHEN 5-325 MG PO TABS
1.0000 | ORAL_TABLET | ORAL | Status: DC | PRN
  Administered 2011-12-21 – 2011-12-22 (×4): 2 via ORAL
  Filled 2011-12-17 (×4): qty 2

## 2011-12-17 MED ORDER — FOLIC ACID 1 MG PO TABS: 1.0000 mg | ORAL_TABLET | Freq: Every day | ORAL | Status: DC

## 2011-12-17 MED ORDER — HYDROMORPHONE HCL PF 1 MG/ML IJ SOLN
1.0000 mg | INTRAMUSCULAR | Status: DC | PRN
  Administered 2011-12-17 (×2): 1 mg via INTRAVENOUS
  Filled 2011-12-17 (×2): qty 1

## 2011-12-17 MED ORDER — PROMETHAZINE HCL 25 MG PO TABS: 12.5000 mg | ORAL_TABLET | ORAL | Status: DC | PRN

## 2011-12-17 MED ORDER — DIPHENHYDRAMINE HCL 25 MG PO CAPS: 25.0000 mg | ORAL_CAPSULE | ORAL | Status: DC | PRN

## 2011-12-17 MED ORDER — HYDROXYUREA 500 MG PO CAPS
1000.0000 mg | ORAL_CAPSULE | Freq: Every day | ORAL | Status: DC
  Administered 2011-12-17 – 2011-12-22 (×6): 1000 mg via ORAL
  Filled 2011-12-17 (×6): qty 2

## 2011-12-17 MED ORDER — DIPHENHYDRAMINE HCL 50 MG/ML IJ SOLN: 12.5000 mg | INTRAMUSCULAR | Status: DC | PRN

## 2011-12-17 NOTE — H&P (Signed)
[facility] History and Physical  [name] JYN:[id number] DOB: May 14, 1991 DOA: 12/16/2011  Referring physician: ED physician PCP: Willey Blade, MD   Chief Complaint: pain in bilateral thighs  HPI:  Pt is 21 yo female with sickle cell disease who presents to [facility] with main concern of progressively worsening bilateral thigh pain that started 1-2 days prior to admission and has been getting progressively worse, worse with movement and with no specific alleviating factors. She describes it as sharp, radiating to bilateral lower extremities and with no specific associated symptoms other than malaise. She denies any specific abdominal or urinary concerns, no fevers, no chills, no chest pain or shortness of breath.   Assessment and Plan:  Sickle Cell Pain - pt has been experiencing recurrent pain episodes - will admit to telemetry floor for further evaluation and management - will provide supportive care with IVF, analgesia, antiemetics as needed - will also obtain UA, UDS, CXR, CE x 1 set - no need for antibiotics at this time as I can not elicit clear evidence for an acute infection  - will obtain CBC in AM to follow upon leukocytosis  Leukocytosis - likely secondary to crisis - will monitor CBC daily  Code Status: Full Family Communication: Pt at bedside Disposition Plan: Admit to telemetry   Review of Systems:  Constitutional: Negative for fever, chills, positive for malaise/fatigue. Negative for diaphoresis.  HENT: Negative for hearing loss, ear pain, nosebleeds, congestion, sore throat, neck pain, tinnitus and ear discharge.   Eyes: Negative for blurred vision, double vision, photophobia, pain, discharge and redness.  Respiratory: Negative for cough, hemoptysis, sputum production, shortness of breath, wheezing and stridor.   Cardiovascular: Negative for chest pain, palpitations, orthopnea, claudication and leg swelling.  Gastrointestinal: Negative for nausea, vomiting  and abdominal pain. Negative for heartburn, constipation, blood in stool and melena.  Genitourinary: Negative for dysuria, urgency, frequency, hematuria and flank pain.  Musculoskeletal: Positive for myalgias, back pain, joint pain, negative for falls.  Skin: Negative for itching and rash.  Neurological: Negative for dizziness and weakness. Negative for tingling, tremors, sensory change, speech change, focal weakness, loss of consciousness and headaches.  Endo/Heme/Allergies: Negative for environmental allergies and polydipsia. Does not bruise/bleed easily.  Psychiatric/Behavioral: Negative for suicidal ideas. The patient is not nervous/anxious.      Past Medical History  Diagnosis Date  . H/O: 1 miscarriage 03/22/2011  . 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 Depression 01/06/2011  . GERD (gastroesophageal reflux disease) 02/17/2011  . Trichotillomania     h/o  . Blood transfusion     "lots"  . Sickle cell anemia with crisis   . Exertional dyspnea     "sometimes"  . Sickle cell anemia   . Headache   . Migraines 11/08/11    "@ least twice/month"  . Chronic back pain     "very severe; have knot in my back; from tight muscle; take RX and exercise for it"  . Mood swings 11/08/11    "I go back and forth; real bad"    Past Surgical History  Procedure Date  . Cholecystectomy 05/2010  . Dilation and curettage of uterus 02/20/11    S/P miscarriage    Social History:  reports that she has never smoked. She has never used smokeless tobacco. She reports that she uses illicit drugs (Marijuana). She reports that she does not drink alcohol.  Allergies  Allergen Reactions  . Latex Rash  . Toradol (Ketorolac Tromethamine) Hives and Itching    No family  history of cancers, no hear diseases.  Prior to Admission medications   Medication Sig Start Date End Date Taking? Authorizing Provider  ALPRAZolam (XANAX) 0.25 MG tablet Take 0.25-0.5 mg by mouth every 3 (three) hours as needed. For anxiety   11/27/11 12/27/11 Yes Grayce Sessions, NP  cholecalciferol (VITAMIN D) 1000 UNITS tablet Take 1,000 Units by mouth daily.   Yes Historical Provider, MD  cyclobenzaprine (FLEXERIL) 5 MG tablet Take 5 mg by mouth 2 (two) times daily as needed. muscle spasms.  11/27/11 01/07/12 Yes Grayce Sessions, NP  fluvoxaMINE (LUVOX) 50 MG tablet Take 1 tablet (50 mg total) by mouth at bedtime. 11/27/11 12/27/11 Yes Grayce Sessions, NP  folic acid (FOLVITE) 1 MG tablet Take 1 mg by mouth daily.   Yes Historical Provider, MD  hydroxyurea (HYDREA) 500 MG capsule Take 1,000 mg by mouth daily. May take with food to minimize GI side effects. 08/22/11  Yes Dawn Wilson Singer, MD  metoprolol succinate (TOPROL-XL) 12.5 mg TB24 Take 0.5 tablets (12.5 mg total) by mouth daily. 11/27/11  Yes Grayce Sessions, NP  morphine (MS CONTIN) 15 MG 12 hr tablet Take 15 mg by mouth 2 (two) times daily as needed. For severe pain 12/15/11  Yes Grayce Sessions, NP  Oxycodone HCl 10 MG TABS Take 15 mg by mouth 4 (four) times daily as needed. For pain.  12/13/11  Yes Grayce Sessions, NP  pantoprazole (PROTONIX) 20 MG tablet Take 20 mg by mouth daily. 12/13/11 12/12/12 Yes Grayce Sessions, NP    Physical Exam: Filed Vitals:   12/17/11 0900 12/17/11 0916 12/17/11 1036 12/17/11 1351  BP:  93/41 110/67 103/67  Pulse: 99 93 92 96  Temp:  98.6 F (37 C)    TempSrc:  Oral    Resp:  16 18 18   SpO2: 100% 98% 100% 98%    Physical Exam  Constitutional: Appears well-developed and well-nourished. In mild distress due to pain HENT: Normocephalic. External right and left ear normal. Oropharynx is clear and moist.  Eyes: Conjunctivae and EOM are normal. PERRLA, no scleral icterus.  Neck: Normal ROM. Neck supple. No JVD. No tracheal deviation. No thyromegaly.  CVS: RRR, S1/S2 +, no murmurs, no gallops, no carotid bruit.  Pulmonary: Effort and breath sounds normal, no stridor, rhonchi, wheezes, rales.  Abdominal: Soft. BS +,  no distension,  tenderness, rebound or guarding.  Musculoskeletal: Normal range of motion. No edema and no tenderness.  Lymphadenopathy: No lymphadenopathy noted, cervical, inguinal. Neuro: Alert. Normal reflexes, muscle tone coordination. No cranial nerve deficit. Skin: Skin is warm and dry. No rash noted. Not diaphoretic. No erythema. No pallor.  Psychiatric: Normal mood and affect. Behavior, judgment, thought content normal.   Labs on Admission:  Basic Metabolic Panel:  Lab 12/16/11 1610 12/14/11 1140 12/13/11 1402 12/11/11 1808  NA 138 136 137 139  K 3.7 3.7 3.9 3.8  CL 101 102 100 104  CO2 26 26 27 23   GLUCOSE 95 91 90 93  BUN 6 8 10 7   CREATININE 0.59 0.61 0.62 0.87  CALCIUM 9.7 9.1 9.4 9.0  MG -- -- -- --  PHOS -- -- -- --   Liver Function Tests:  Lab 12/16/11 2055 12/14/11 1140 12/13/11 1402 12/11/11 1808  AST 27 24 21 24   ALT 17 16 13 13   ALKPHOS 62 77 75 76  BILITOT 1.7* 2.1* 2.7* 1.5*  PROT 8.2 7.2 7.2 7.1  ALBUMIN 4.1 3.9 3.9 3.8   CBC:  Lab 12/16/11 2055 12/14/11 1140 12/13/11 1402 12/12/11 0402 12/11/11 1808  WBC 17.2* 14.0* 16.7* 13.9* 14.1*  NEUTROABS -- -- -- -- --  HGB 10.4* 9.3* 9.4* 9.1* 9.7*  HCT 29.5* 26.7* 26.7* 26.0* 27.5*  MCV 93.7 92.1 92.1 91.9 91.7  PLT 595* 612* 620* 565* 566*    Radiological Exams on Admission: No results found.  [id number]  Debbora Presto, MD  [facility] Pager [phone]  If 7PM-7AM, please contact night-coverage [email or web address] Password [email or web address] 12/17/2011, 4:10 PM

## 2011-12-17 NOTE — ED Provider Notes (Signed)
7:21 AM Care of pt assumed in CDU. Pt on sickle cell protocol. Recently admitted to [facility] with crisis, returned to [facility] yesterday with the same. Pain located to thighs, c/w previous sickle cell pain.  11:30 AM Pt reassessed, reports no improvement in sx.  3:09 PM Despite additional dose of medication, pt remains without improvement of sx. Pt has now been on CDU obs for 23 hrs. Dr. Izola Price with [facility] contacted, agrees to admit the pt for further treatment.  Grant Fontana, PA-C 12/17/11 1511

## 2011-12-17 NOTE — ED Notes (Signed)
I notifyed the charge nursE Silva Bandy that is her nurse to let her know her bp was 93/41. 9:20am JG.

## 2011-12-18 LAB — RAPID URINE DRUG SCREEN, HOSP PERFORMED
Amphetamines: NOT DETECTED
Barbiturates: NOT DETECTED
Benzodiazepines: NOT DETECTED
Cocaine: NOT DETECTED

## 2011-12-18 LAB — URINALYSIS, ROUTINE W REFLEX MICROSCOPIC
Bilirubin Urine: NEGATIVE
Glucose, UA: NEGATIVE mg/dL
Hgb urine dipstick: NEGATIVE
Nitrite: NEGATIVE
Specific Gravity, Urine: 1.01 (ref 1.005–1.030)
pH: 6.5 (ref 5.0–8.0)

## 2011-12-18 LAB — GLUCOSE, CAPILLARY: Glucose-Capillary: 111 mg/dL — ABNORMAL HIGH (ref 70–99)

## 2011-12-18 NOTE — Progress Notes (Signed)
KARYSA HEFT [id number] Admitted to 5509: 12/17/2011 1620 Attending Provider: Gwenyth Bender, MD    [name] is a 21 y.o. female patient admitted from ED awake, alert  & orientated  X 3,  Full Code, VSS - Blood pressure 129/84, pulse 101, temperature 99.108F (36.8 C), temperature source Oral, resp. rate 18, height 5\' 3"  (1.6 m), weight 88.6 kg (195 lb 5.2 oz), last menstrual period 11/29/2011, SpO2 98.00% RA, no c/o shortness of breath, no c/o chest pain, no distress noted.    IV site WDL:  hand right, condition patent and no redness with a transparent dsg that's clean dry and intact.  Allergies:   Allergies  Allergen Reactions  . Latex Rash  . Toradol (Ketorolac Tromethamine) Hives and Itching     Past Medical History  Diagnosis Date  . H/O: 1 miscarriage 03/22/2011  . 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 Depression 01/06/2011  . GERD (gastroesophageal reflux disease) 02/17/2011  . Trichotillomania     h/o  . Blood transfusion     "lots"  . Sickle cell anemia with crisis   . Exertional dyspnea     "sometimes"  . Sickle cell anemia   . Headache   . Migraines 11/08/11    "@ least twice/month"  . Chronic back pain     "very severe; have knot in my back; from tight muscle; take RX and exercise for it"  . Mood swings 11/08/11    "I go back and forth; real bad"    History:  obtained from the patient.  Pt orientation to unit, room and routine. Information packet given to patient/family and safety video watched.  Admission INP armband ID verified with patient/family, and in place. SR up x 2, fall risk assessment complete with Patient and family verbalizing understanding of risks associated with falls. Pt verbalizes an understanding of how to use the call bell and to call for help before getting out of bed.  Skin, clean-dry- intact without evidence of bruising, or skin tears.   No evidence of skin break down noted on exam.    Will cont to monitor and assist as needed.  Julien Nordmann [facility], RN 12/18/2011 12:12 PM

## 2011-12-19 LAB — GLUCOSE, CAPILLARY
Glucose-Capillary: 125 mg/dL — ABNORMAL HIGH (ref 70–99)
Glucose-Capillary: 119 mg/dL — ABNORMAL HIGH (ref 70–99)

## 2011-12-19 NOTE — Progress Notes (Signed)
Subjective:  Patient reports she's feeling better today.she continues to have dull aching pain in her left leg. She rates his pain at most as a 7/10. She denies chest pains or shortness of breath. Denies further right leg or hip pain. She continues to have ongoing home stressors. States she came to: Hospital versus less than one to avoid her previous stressors.   Allergies  Allergen Reactions  . Latex Rash  . Toradol (Ketorolac Tromethamine) Hives and Itching   Current Facility-Administered Medications  Medication Dose Route Frequency Provider Last Rate Last Dose  . 0.9 %  sodium chloride infusion   Intravenous Continuous Dorothea Ogle, MD 100 mL/hr at 12/19/11 1930    . ALPRAZolam Prudy Feeler) tablet 0.25-0.5 mg  0.25-0.5 mg Oral Q3H PRN Dorothea Ogle, MD      . cholecalciferol (VITAMIN D) tablet 1,000 Units  1,000 Units Oral Daily Dorothea Ogle, MD   1,000 Units at 12/19/11 1021  . cyclobenzaprine (FLEXERIL) tablet 5 mg  5 mg Oral BID PRN Dorothea Ogle, MD   5 mg at 12/18/11 0055  . diphenhydrAMINE (BENADRYL) capsule 25-50 mg  25-50 mg Oral Q4H PRN Dorothea Ogle, MD       Or  . diphenhydrAMINE (BENADRYL) injection 12.5-25 mg  12.5-25 mg Intravenous Q4H PRN Dorothea Ogle, MD      . enoxaparin (LOVENOX) injection 30 mg  30 mg Subcutaneous Q24H Dorothea Ogle, MD   30 mg at 12/18/11 1714  . folic acid (FOLVITE) tablet 1 mg  1 mg Oral Daily Dorothea Ogle, MD   1 mg at 12/19/11 1021  . HYDROcodone-acetaminophen (NORCO/VICODIN) 5-325 MG per tablet 1-2 tablet  1-2 tablet Oral Q4H PRN Dorothea Ogle, MD      . HYDROmorphone (DILAUDID) injection 2 mg  2 mg Intravenous Q2H PRN Dorothea Ogle, MD   2 mg at 12/19/11 2029  . hydroxyurea (HYDREA) capsule 1,000 mg  1,000 mg Oral Daily Dorothea Ogle, MD   1,000 mg at 12/19/11 1020  . metoprolol succinate (TOPROL-XL) 24 hr tablet 12.5 mg  12.5 mg Oral Daily Dorothea Ogle, MD   12.5 mg at 12/19/11 1020  . pantoprazole (PROTONIX) EC tablet 20 mg  20 mg Oral Daily  Dorothea Ogle, MD   20 mg at 12/19/11 1021  . promethazine (PHENERGAN) tablet 12.5-25 mg  12.5-25 mg Oral Q4H PRN Dorothea Ogle, MD       Or  . promethazine (PHENERGAN) suppository 12.5-25 mg  12.5-25 mg Rectal Q4H PRN Dorothea Ogle, MD        Objective: Blood pressure 110/74, pulse 92, temperature 98 F (36.7 C), temperature source Oral, resp. rate 18, height 5\' 3"  (1.6 m), weight 195 lb 5.2 oz (88.6 kg), last menstrual period 11/29/2011, SpO2 96.00%, not currently breastfeeding.  Well-developed overweight black female presently in no acute distress. HEENT:no sinus tenderness. No sclera icterus. NECK:no enlarged thyroid. No posterior cervical nodes. LUNGS:clear to auscultation. ZO:XWRUEA S1, S2 without S3. ABD:no epigastric tenderness. VWU:JWJXBJYN Homans. No edema. NEURO:intact.  Lab results: Results for orders placed during the hospital encounter of 12/16/11 (from the past 48 hour(s))  URINALYSIS, ROUTINE W REFLEX MICROSCOPIC     Status: Abnormal   Collection Time   12/18/11 12:07 AM      Component Value Range Comment   Color, Urine YELLOW  YELLOW    APPearance HAZY (*) CLEAR    Specific Gravity, Urine 1.010  1.005 - 1.030  pH 6.5  5.0 - 8.0    Glucose, UA NEGATIVE  NEGATIVE mg/dL    Hgb urine dipstick NEGATIVE  NEGATIVE    Bilirubin Urine NEGATIVE  NEGATIVE    Ketones, ur NEGATIVE  NEGATIVE mg/dL    Protein, ur NEGATIVE  NEGATIVE mg/dL    Urobilinogen, UA 1.0  0.0 - 1.0 mg/dL    Nitrite NEGATIVE  NEGATIVE    Leukocytes, UA NEGATIVE  NEGATIVE MICROSCOPIC NOT DONE ON URINES WITH NEGATIVE PROTEIN, BLOOD, LEUKOCYTES, NITRITE, OR GLUCOSE <1000 mg/dL.  URINE RAPID DRUG SCREEN (HOSP PERFORMED)     Status: Normal   Collection Time   12/18/11 12:07 AM      Component Value Range Comment   Opiates NONE DETECTED  NONE DETECTED    Cocaine NONE DETECTED  NONE DETECTED    Benzodiazepines NONE DETECTED  NONE DETECTED    Amphetamines NONE DETECTED  NONE DETECTED    Tetrahydrocannabinol  NONE DETECTED  NONE DETECTED    Barbiturates NONE DETECTED  NONE DETECTED   GLUCOSE, CAPILLARY     Status: Normal   Collection Time   12/18/11  7:54 AM      Component Value Range Comment   Glucose-Capillary 96  70 - 99 mg/dL   GLUCOSE, CAPILLARY     Status: Normal   Collection Time   12/18/11 12:30 PM      Component Value Range Comment   Glucose-Capillary 95  70 - 99 mg/dL   GLUCOSE, CAPILLARY     Status: Abnormal   Collection Time   12/18/11  4:50 PM      Component Value Range Comment   Glucose-Capillary 101 (*) 70 - 99 mg/dL   GLUCOSE, CAPILLARY     Status: Abnormal   Collection Time   12/18/11 10:31 PM      Component Value Range Comment   Glucose-Capillary 111 (*) 70 - 99 mg/dL   GLUCOSE, CAPILLARY     Status: Normal   Collection Time   12/19/11  7:51 AM      Component Value Range Comment   Glucose-Capillary 97  70 - 99 mg/dL   GLUCOSE, CAPILLARY     Status: Abnormal   Collection Time   12/19/11 12:08 PM      Component Value Range Comment   Glucose-Capillary 103 (*) 70 - 99 mg/dL   GLUCOSE, CAPILLARY     Status: Abnormal   Collection Time   12/19/11  5:22 PM      Component Value Range Comment   Glucose-Capillary 125 (*) 70 - 99 mg/dL     Studies/Results: No results found.  Patient Active Problem List  Diagnosis  . SICKLE CELL ANEMIA  . TRICHOTILLOMANIA  . Active smoker  . Back pain  . Depression  . GERD (gastroesophageal reflux disease)  . Contraception management  . Stress  . Overweight  . Vaso-occlusive sickle cell crisis  . Right thigh pain  . Vaginosis  . Tachycardia    Impression: Sickle cell crisis. Limb pain secondary to #1. Situational stress. Tobacco abuse. Trichotillomania History of GERD.   Plan: Continue present therapy. Followup her CBC, CMET, CRP, reticulocyte count to assess the degree of after crisis. Gradually taper her IV pain medication as tolerated. She will need a PICC line if exchange transfusion is indicated.   [name],  [name] 12/19/2011 8:37 PM

## 2011-12-20 LAB — COMPREHENSIVE METABOLIC PANEL
AST: 21 U/L (ref 0–37)
Albumin: 3.5 g/dL (ref 3.5–5.2)
CO2: 25 mEq/L (ref 19–32)
Calcium: 9 mg/dL (ref 8.4–10.5)
Creatinine, Ser: 0.64 mg/dL (ref 0.50–1.10)
GFR calc non Af Amer: >90 mL/min (ref >90)
Total Protein: 6.8 g/dL (ref 6.0–8.3)

## 2011-12-20 LAB — GLUCOSE, CAPILLARY: Glucose-Capillary: 93 mg/dL (ref 70–99)

## 2011-12-20 LAB — RETICULOCYTES
RBC.: 2.84 MIL/uL — ABNORMAL LOW (ref 3.87–5.11)
Retic Ct Pct: 9.3 % — ABNORMAL HIGH (ref 0.4–3.1)

## 2011-12-20 LAB — CBC
MCH: 31.7 pg (ref 26.0–34.0)
MCHC: 34.4 g/dL (ref 30.0–36.0)
MCV: 92.3 fL (ref 78.0–100.0)
Platelets: 462 10*3/uL — ABNORMAL HIGH (ref 150–400)
RDW: 16 % — ABNORMAL HIGH (ref 11.5–15.5)

## 2011-12-20 MED ORDER — POTASSIUM CHLORIDE IN NACL 20-0.45 MEQ/L-% IV SOLN
INTRAVENOUS | Status: DC
  Administered 2011-12-20 – 2011-12-22 (×5): via INTRAVENOUS
  Filled 2011-12-20 (×8): qty 1000

## 2011-12-20 MED ORDER — HYDROMORPHONE HCL PF 1 MG/ML IJ SOLN
2.0000 mg | INTRAMUSCULAR | Status: DC | PRN
  Administered 2011-12-20 – 2011-12-22 (×7): 2 mg via INTRAVENOUS
  Filled 2011-12-20 (×7): qty 2

## 2011-12-20 MED ORDER — MORPHINE SULFATE ER 15 MG PO TBCR
15.0000 mg | EXTENDED_RELEASE_TABLET | Freq: Two times a day (BID) | ORAL | Status: DC
  Administered 2011-12-20 – 2011-12-22 (×5): 15 mg via ORAL
  Filled 2011-12-20 (×5): qty 1

## 2011-12-20 NOTE — Progress Notes (Signed)
Subjective:  Patient continues to report gradual improvement. She is beginning to ambulate more. She notes ongoing right thigh and hip pain. Pain is presently decreased to 6/10. She denies chest pains or shortness of breath. Slow bowel activity. She takes oxycodone 10 mg every 4-6 hours at home. She feels she will soon be able to manage this with this regimen. No other new complaints.   Allergies  Allergen Reactions  . Latex Rash  . Toradol (Ketorolac Tromethamine) Hives and Itching   Current Facility-Administered Medications  Medication Dose Route Frequency Provider Last Rate Last Dose  . 0.45 % NaCl with KCl 20 mEq / L infusion   Intravenous Continuous Gwenyth Bender, MD 75 mL/hr at 12/20/11 1015    . ALPRAZolam Prudy Feeler) tablet 0.25-0.5 mg  0.25-0.5 mg Oral Q3H PRN Dorothea Ogle, MD      . cholecalciferol (VITAMIN D) tablet 1,000 Units  1,000 Units Oral Daily Dorothea Ogle, MD   1,000 Units at 12/20/11 1016  . cyclobenzaprine (FLEXERIL) tablet 5 mg  5 mg Oral BID PRN Dorothea Ogle, MD   5 mg at 12/18/11 0055  . diphenhydrAMINE (BENADRYL) capsule 25-50 mg  25-50 mg Oral Q4H PRN Dorothea Ogle, MD       Or  . diphenhydrAMINE (BENADRYL) injection 12.5-25 mg  12.5-25 mg Intravenous Q4H PRN Dorothea Ogle, MD      . enoxaparin (LOVENOX) injection 30 mg  30 mg Subcutaneous Q24H Dorothea Ogle, MD   30 mg at 12/18/11 1714  . folic acid (FOLVITE) tablet 1 mg  1 mg Oral Daily Dorothea Ogle, MD   1 mg at 12/20/11 1016  . HYDROcodone-acetaminophen (NORCO/VICODIN) 5-325 MG per tablet 1-2 tablet  1-2 tablet Oral Q4H PRN Dorothea Ogle, MD      . HYDROmorphone (DILAUDID) injection 2 mg  2 mg Intravenous Q3H PRN Gwenyth Bender, MD      . hydroxyurea (HYDREA) capsule 1,000 mg  1,000 mg Oral Daily Dorothea Ogle, MD   1,000 mg at 12/20/11 1016  . metoprolol succinate (TOPROL-XL) 24 hr tablet 12.5 mg  12.5 mg Oral Daily Dorothea Ogle, MD   12.5 mg at 12/20/11 1016  . morphine (MS CONTIN) 12 hr tablet 15 mg  15 mg Oral  Q12H Gwenyth Bender, MD   15 mg at 12/20/11 0905  . pantoprazole (PROTONIX) EC tablet 20 mg  20 mg Oral Daily Dorothea Ogle, MD   20 mg at 12/20/11 1016  . promethazine (PHENERGAN) tablet 12.5-25 mg  12.5-25 mg Oral Q4H PRN Dorothea Ogle, MD       Or  . promethazine (PHENERGAN) suppository 12.5-25 mg  12.5-25 mg Rectal Q4H PRN Dorothea Ogle, MD      . DISCONTD: 0.9 %  sodium chloride infusion   Intravenous Continuous Dorothea Ogle, MD 100 mL/hr at 12/20/11 0536    . DISCONTD: HYDROmorphone (DILAUDID) injection 2 mg  2 mg Intravenous Q2H PRN Dorothea Ogle, MD   2 mg at 12/20/11 1901    Objective: Blood pressure 106/70, pulse 88, temperature 98.7 F (37.1 C), temperature source Oral, resp. rate 18, height 5\' 3"  (1.6 m), weight 195 lb 5.2 oz (88.6 kg), last menstrual period 11/29/2011, SpO2 98.00%, not currently breastfeeding.  Well-developed well-nourished overweight black female in no acute distress. HEENT: No sinus tenderness. No sclera icterus. NECK: no posterior cervical nodes. LUNGS: Clear to auscultation. No wheezes or rales. CV: Normal S1,  S2 without S3. ABD: Soft nontender. MSK: Tenderness with fullness on the right lateral thigh region. No increased warmth. NEURO: Intact.  Lab results: Results for orders placed during the hospital encounter of 12/16/11 (from the past 48 hour(s))  GLUCOSE, CAPILLARY     Status: Abnormal   Collection Time   12/18/11 10:31 PM      Component Value Range Comment   Glucose-Capillary 111 (*) 70 - 99 mg/dL   GLUCOSE, CAPILLARY     Status: Normal   Collection Time   12/19/11  7:51 AM      Component Value Range Comment   Glucose-Capillary 97  70 - 99 mg/dL   GLUCOSE, CAPILLARY     Status: Abnormal   Collection Time   12/19/11 12:08 PM      Component Value Range Comment   Glucose-Capillary 103 (*) 70 - 99 mg/dL   GLUCOSE, CAPILLARY     Status: Abnormal   Collection Time   12/19/11  5:22 PM      Component Value Range Comment   Glucose-Capillary 125 (*) 70  - 99 mg/dL   GLUCOSE, CAPILLARY     Status: Abnormal   Collection Time   12/19/11  9:35 PM      Component Value Range Comment   Glucose-Capillary 119 (*) 70 - 99 mg/dL    Comment 1 Notify RN      Comment 2 Documented in Chart     CBC     Status: Abnormal   Collection Time   12/20/11  9:07 AM      Component Value Range Comment   WBC 15.3 (*) 4.0 - 10.5 K/uL    RBC 2.84 (*) 3.87 - 5.11 MIL/uL    Hemoglobin 9.0 (*) 12.0 - 15.0 g/dL    HCT 16.1 (*) 09.6 - 46.0 %    MCV 92.3  78.0 - 100.0 fL    MCH 31.7  26.0 - 34.0 pg    MCHC 34.4  30.0 - 36.0 g/dL    RDW 04.5 (*) 40.9 - 15.5 %    Platelets 462 (*) 150 - 400 K/uL   COMPREHENSIVE METABOLIC PANEL     Status: Abnormal   Collection Time   12/20/11  9:07 AM      Component Value Range Comment   Sodium 137  135 - 145 mEq/L    Potassium 3.7  3.5 - 5.1 mEq/L    Chloride 103  96 - 112 mEq/L    CO2 25  19 - 32 mEq/L    Glucose, Bld 85  70 - 99 mg/dL    BUN 10  6 - 23 mg/dL    Creatinine, Ser 8.11  0.50 - 1.10 mg/dL    Calcium 9.0  8.4 - 91.4 mg/dL    Total Protein 6.8  6.0 - 8.3 g/dL    Albumin 3.5  3.5 - 5.2 g/dL    AST 21  0 - 37 U/L    ALT 13  0 - 35 U/L    Alkaline Phosphatase 63  39 - 117 U/L    Total Bilirubin 1.7 (*) 0.3 - 1.2 mg/dL    GFR calc non Af Amer >90  >90 mL/min    GFR calc Af Amer >90  >90 mL/min   C-REACTIVE PROTEIN     Status: Abnormal   Collection Time   12/20/11  9:07 AM      Component Value Range Comment   CRP 0.5 (*) <0.60 mg/dL   RETICULOCYTES  Status: Abnormal   Collection Time   12/20/11  9:07 AM      Component Value Range Comment   Retic Ct Pct 9.3 (*) 0.4 - 3.1 %    RBC. 2.84 (*) 3.87 - 5.11 MIL/uL    Retic Count, Manual 264.1 (*) 19.0 - 186.0 K/uL   GLUCOSE, CAPILLARY     Status: Normal   Collection Time   12/20/11  9:17 AM      Component Value Range Comment   Glucose-Capillary 97  70 - 99 mg/dL   GLUCOSE, CAPILLARY     Status: Normal   Collection Time   12/20/11 11:41 AM      Component Value Range  Comment   Glucose-Capillary 93  70 - 99 mg/dL   GLUCOSE, CAPILLARY     Status: Abnormal   Collection Time   12/20/11  4:59 PM      Component Value Range Comment   Glucose-Capillary 110 (*) 70 - 99 mg/dL     Studies/Results: No results found.  Patient Active Problem List  Diagnosis  . SICKLE CELL ANEMIA  . TRICHOTILLOMANIA  . Active smoker  . Back pain  . Depression  . GERD (gastroesophageal reflux disease)  . Contraception management  . Stress  . Overweight  . Vaso-occlusive sickle cell crisis  . Right thigh pain  . Vaginosis  . Tachycardia    Impression: Resolving sickle cell crisis. She still has some mild active hemolysis with increased reticulocyte count. This is presently mild at most. 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 This was. She demonstrated on CT scan in June. History of elevated CPK with mild myositis. His followup. Anxiety disorder. Tobacco abuse. Abstaining. Situational stress.   Plan: Gradual taper of IV Dilaudid to every 3 hours x4 doses then q. 4 hours. Patient with documented history of rapid relapse. Discharge home tomorrow afternoon on oxycodone as tolerated. Followup CBC, CPK in a.m.     August Saucer, Liona Wengert 12/20/2011 8:04 PM

## 2011-12-21 LAB — CBC
HCT: 27.2 % — ABNORMAL LOW (ref 36.0–46.0)
Platelets: 425 10*3/uL — ABNORMAL HIGH (ref 150–400)
RDW: 17.1 % — ABNORMAL HIGH (ref 11.5–15.5)
WBC: 16.8 10*3/uL — ABNORMAL HIGH (ref 4.0–10.5)

## 2011-12-21 LAB — COMPREHENSIVE METABOLIC PANEL
AST: 28 U/L (ref 0–37)
Albumin: 3.7 g/dL (ref 3.5–5.2)
Alkaline Phosphatase: 61 U/L (ref 39–117)
Chloride: 103 mEq/L (ref 96–112)
Potassium: 4.2 mEq/L (ref 3.5–5.1)
Total Bilirubin: 2 mg/dL — ABNORMAL HIGH (ref 0.3–1.2)

## 2011-12-21 LAB — GLUCOSE, CAPILLARY: Glucose-Capillary: 106 mg/dL — ABNORMAL HIGH (ref 70–99)

## 2011-12-21 NOTE — Progress Notes (Signed)
Subjective:  Patient feeling better overall. She still sleeping a great deal. She notes her pain however has decreased to a 6/10. She's been using mostly pills today with occasional IV Dilaudid. She denies chest pains or shortness of breath. He'll she'll due to go home small morning. She is aware that she will need to be able to control her pain with oral medicines.   Allergies  Allergen Reactions  . Latex Rash  . Toradol (Ketorolac Tromethamine) Hives and Itching   Current Facility-Administered Medications  Medication Dose Route Frequency Provider Last Rate Last Dose  . 0.45 % NaCl with KCl 20 mEq / L infusion   Intravenous Continuous Gwenyth Bender, MD 75 mL/hr at 12/21/11 1730    . ALPRAZolam Prudy Feeler) tablet 0.25-0.5 mg  0.25-0.5 mg Oral Q3H PRN Dorothea Ogle, MD      . cholecalciferol (VITAMIN D) tablet 1,000 Units  1,000 Units Oral Daily Dorothea Ogle, MD   1,000 Units at 12/21/11 1004  . cyclobenzaprine (FLEXERIL) tablet 5 mg  5 mg Oral BID PRN Dorothea Ogle, MD   5 mg at 12/18/11 0055  . diphenhydrAMINE (BENADRYL) capsule 25-50 mg  25-50 mg Oral Q4H PRN Dorothea Ogle, MD       Or  . diphenhydrAMINE (BENADRYL) injection 12.5-25 mg  12.5-25 mg Intravenous Q4H PRN Dorothea Ogle, MD      . enoxaparin (LOVENOX) injection 30 mg  30 mg Subcutaneous Q24H Dorothea Ogle, MD   30 mg at 12/21/11 1731  . folic acid (FOLVITE) tablet 1 mg  1 mg Oral Daily Dorothea Ogle, MD   1 mg at 12/21/11 1004  . HYDROcodone-acetaminophen (NORCO/VICODIN) 5-325 MG per tablet 1-2 tablet  1-2 tablet Oral Q4H PRN Dorothea Ogle, MD   2 tablet at 12/21/11 1237  . HYDROmorphone (DILAUDID) injection 2 mg  2 mg Intravenous Q3H PRN Gwenyth Bender, MD   2 mg at 12/21/11 1438  . hydroxyurea (HYDREA) capsule 1,000 mg  1,000 mg Oral Daily Dorothea Ogle, MD   1,000 mg at 12/21/11 1003  . metoprolol succinate (TOPROL-XL) 24 hr tablet 12.5 mg  12.5 mg Oral Daily Dorothea Ogle, MD   12.5 mg at 12/21/11 1004  . morphine (MS CONTIN) 12 hr  tablet 15 mg  15 mg Oral Q12H Gwenyth Bender, MD   15 mg at 12/21/11 1003  . pantoprazole (PROTONIX) EC tablet 20 mg  20 mg Oral Daily Dorothea Ogle, MD   20 mg at 12/21/11 1004  . promethazine (PHENERGAN) tablet 12.5-25 mg  12.5-25 mg Oral Q4H PRN Dorothea Ogle, MD       Or  . promethazine (PHENERGAN) suppository 12.5-25 mg  12.5-25 mg Rectal Q4H PRN Dorothea Ogle, MD        Objective: Blood pressure 102/59, pulse 69, temperature 98.6 F (37 C), temperature source Oral, resp. rate 20, height 5\' 3"  (1.6 m), weight 191 lb 2.2 oz (86.7 kg), last menstrual period 11/29/2011, SpO2 97.00%, not currently breastfeeding.  Well-developed well-nourished overweight black female in no acute distress. Sleeping comfortably. HEENT: No sinus tenderness. No sclera icterus. NECK: No enlarged thyroid. No posterior cervical nodes.  LUNGS: Clear to auscultation. No vocal fremitus. CV: Normal S1, S2 without S3. ABD: No masses or tenderness. MSK: Negative Homans no edema. NEURO: Intact.  Lab results: Results for orders placed during the hospital encounter of 12/16/11 (from the past 48 hour(s))  GLUCOSE, CAPILLARY  Status: Abnormal   Collection Time   12/19/11  9:35 PM      Component Value Range Comment   Glucose-Capillary 119 (*) 70 - 99 mg/dL    Comment 1 Notify RN      Comment 2 Documented in Chart     CBC     Status: Abnormal   Collection Time   12/20/11  9:07 AM      Component Value Range Comment   WBC 15.3 (*) 4.0 - 10.5 K/uL    RBC 2.84 (*) 3.87 - 5.11 MIL/uL    Hemoglobin 9.0 (*) 12.0 - 15.0 g/dL    HCT 78.4 (*) 69.6 - 46.0 %    MCV 92.3  78.0 - 100.0 fL    MCH 31.7  26.0 - 34.0 pg    MCHC 34.4  30.0 - 36.0 g/dL    RDW 29.5 (*) 28.4 - 15.5 %    Platelets 462 (*) 150 - 400 K/uL   COMPREHENSIVE METABOLIC PANEL     Status: Abnormal   Collection Time   12/20/11  9:07 AM      Component Value Range Comment   Sodium 137  135 - 145 mEq/L    Potassium 3.7  3.5 - 5.1 mEq/L    Chloride 103  96 - 112  mEq/L    CO2 25  19 - 32 mEq/L    Glucose, Bld 85  70 - 99 mg/dL    BUN 10  6 - 23 mg/dL    Creatinine, Ser 1.32  0.50 - 1.10 mg/dL    Calcium 9.0  8.4 - 44.0 mg/dL    Total Protein 6.8  6.0 - 8.3 g/dL    Albumin 3.5  3.5 - 5.2 g/dL    AST 21  0 - 37 U/L    ALT 13  0 - 35 U/L    Alkaline Phosphatase 63  39 - 117 U/L    Total Bilirubin 1.7 (*) 0.3 - 1.2 mg/dL    GFR calc non Af Amer >90  >90 mL/min    GFR calc Af Amer >90  >90 mL/min   C-REACTIVE PROTEIN     Status: Abnormal   Collection Time   12/20/11  9:07 AM      Component Value Range Comment   CRP 0.5 (*) <0.60 mg/dL   RETICULOCYTES     Status: Abnormal   Collection Time   12/20/11  9:07 AM      Component Value Range Comment   Retic Ct Pct 9.3 (*) 0.4 - 3.1 %    RBC. 2.84 (*) 3.87 - 5.11 MIL/uL    Retic Count, Manual 264.1 (*) 19.0 - 186.0 K/uL   GLUCOSE, CAPILLARY     Status: Normal   Collection Time   12/20/11  9:17 AM      Component Value Range Comment   Glucose-Capillary 97  70 - 99 mg/dL   GLUCOSE, CAPILLARY     Status: Normal   Collection Time   12/20/11 11:41 AM      Component Value Range Comment   Glucose-Capillary 93  70 - 99 mg/dL   GLUCOSE, CAPILLARY     Status: Abnormal   Collection Time   12/20/11  4:59 PM      Component Value Range Comment   Glucose-Capillary 110 (*) 70 - 99 mg/dL   GLUCOSE, CAPILLARY     Status: Normal   Collection Time   12/20/11  9:40 PM      Component  Value Range Comment   Glucose-Capillary 88  70 - 99 mg/dL    Comment 1 Documented in Chart      Comment 2 Notify RN     GLUCOSE, CAPILLARY     Status: Abnormal   Collection Time   12/21/11  7:41 AM      Component Value Range Comment   Glucose-Capillary 132 (*) 70 - 99 mg/dL   GLUCOSE, CAPILLARY     Status: Normal   Collection Time   12/21/11 11:44 AM      Component Value Range Comment   Glucose-Capillary 96  70 - 99 mg/dL   GLUCOSE, CAPILLARY     Status: Abnormal   Collection Time   12/21/11  4:47 PM      Component Value Range Comment    Glucose-Capillary 106 (*) 70 - 99 mg/dL     Studies/Results: No results found.  Patient Active Problem List  Diagnosis  . SICKLE CELL ANEMIA  . TRICHOTILLOMANIA  . Active smoker  . Back pain  . Depression  . GERD (gastroesophageal reflux disease)  . Contraception management  . Stress  . Overweight  . Vaso-occlusive sickle cell crisis  . Right thigh pain  . Vaginosis  . Tachycardia    Impression: Resolving sickle cell crisis. Anxiety disorder with situational stress. 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 History of tobacco abuse.   Plan: Continue to transition oral medication. Discharge patient home in a.m. Repeat CBC, CPK and CMET prior to discharge. Followup the sickle cell clinic.   August Saucer, Chyrel Taha 12/21/2011 7:39 PM

## 2011-12-21 NOTE — Progress Notes (Signed)
PT pulled out her IV while putting on her gown. IV site is unremarkable. RN notified IV team to start IV because pt a really hard stick. RN will set up IV fluids again once the pt has gotten a new IV site.

## 2011-12-22 LAB — GLUCOSE, CAPILLARY: Glucose-Capillary: 92 mg/dL (ref 70–99)

## 2011-12-22 NOTE — Progress Notes (Addendum)
[name] discharged Home per MD order.    [name], [name]  Home Medication Instructions ZOX:[id number]   Printed on:12/22/11 1611  Medication Information                    cholecalciferol (VITAMIN D) 1000 UNITS tablet Take 1,000 Units by mouth daily.           hydroxyurea (HYDREA) 500 MG capsule Take 1,000 mg by mouth daily. May take with food to minimize GI side effects.           fluvoxaMINE (LUVOX) 50 MG tablet Take 1 tablet (50 mg total) by mouth at bedtime.           metoprolol succinate (TOPROL-XL) 12.5 mg TB24 Take 0.5 tablets (12.5 mg total) by mouth daily.           cyclobenzaprine (FLEXERIL) 5 MG tablet Take 5 mg by mouth 2 (two) times daily as needed. muscle spasms.            folic acid (FOLVITE) 1 MG tablet Take 1 mg by mouth daily.           ALPRAZolam (XANAX) 0.25 MG tablet Take 0.25-0.5 mg by mouth every 3 (three) hours as needed. For anxiety            Oxycodone HCl 10 MG TABS Take 15 mg by mouth 4 (four) times daily as needed. For pain.            pantoprazole (PROTONIX) 20 MG tablet Take 20 mg by mouth daily.           morphine (MS CONTIN) 15 MG 12 hr tablet Take 15 mg by mouth 2 (two) times daily as needed. For severe pain             Patients skin is clean, dry and intact, no evidence of skin break down. IV site discontinued and catheter remains intact. Site without signs and symptoms of complications. Dressing and pressure applied.  Patient transported via w/c by volunteers.  [name] 12/22/2011 4:11 PM

## 2011-12-23 NOTE — Care Management Note (Signed)
    Page 1 of 1   12/23/2011     12:19:09 PM   CARE MANAGEMENT NOTE 12/23/2011  Patient:  [name], [name]   Account Number:  [id number]  Date Initiated:  12/23/2011  Documentation initiated by:  Letha Cape  Subjective/Objective Assessment:   dx sickle cell crisis  admit     Action/Plan:   Anticipated DC Date:  12/22/2011   Anticipated DC Plan:  HOME/SELF CARE      DC Planning Services  CM consult      Choice offered to / List presented to:             Status of service:  Completed, signed off Medicare Important Message given?   (If response is "NO", the following Medicare IM given date fields will be blank) Date Medicare IM given:   Date Additional Medicare IM given:    Discharge Disposition:  HOME/SELF CARE  Per UR Regulation:  Reviewed for med. necessity/level of care/duration of stay  If discussed at Long Length of Stay Meetings, dates discussed:    Comments:  12/23/11 12:17 Letha Cape RN, BSN [phone] patient lives with parent, pta independent.  Patient dc to home, no needs anticipated.

## 2011-12-29 IMAGING — US US OB COMP LESS 14 WK
1 series · 14 of 17 positions shown · non-contrast
Comparison: Endovaginal ultrasound - 01/25/2011;

CLINICAL DATA: Vaginal bleeding

OBSTETRIC <14 WK US AND TRANSVAGINAL OB US
TECHNIQUE: Both transabdominal and transvaginal ultrasound
examinations were performed for complete evaluation of the
gestation as well as the maternal uterus, adnexal regions, and
pelvic cul-de-sac.  Transvaginal technique was performed to assess
early pregnancy.

[Series 1: us ob comp less 14 wks · 17 acquisitions, 14 frames shown]
[im 1/17]
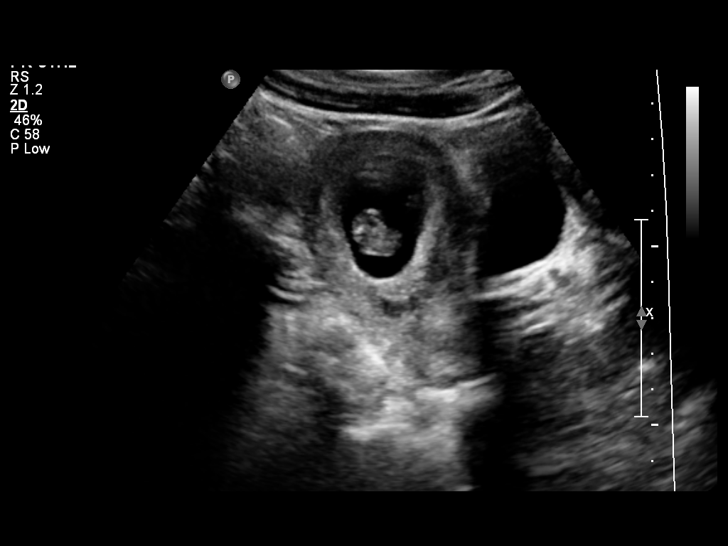
[im 2/17]
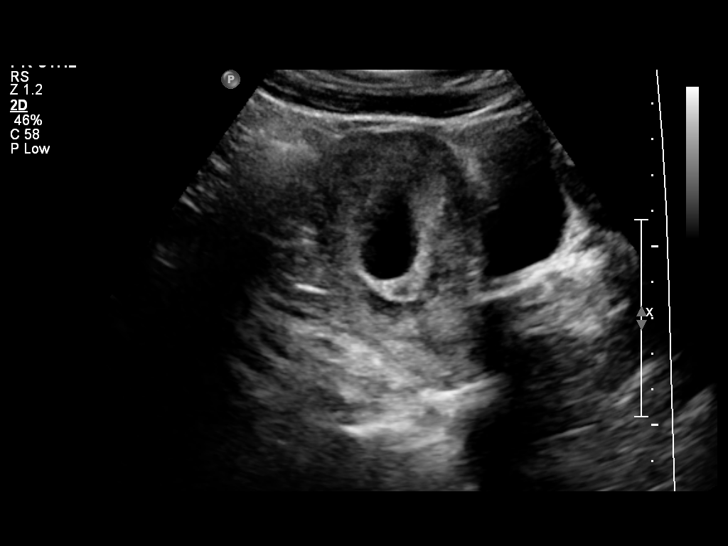
[im 4/17]
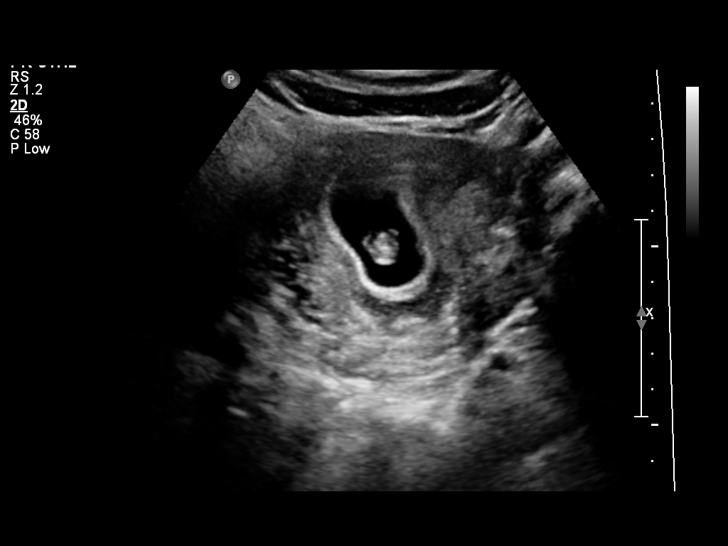
[im 5/17]
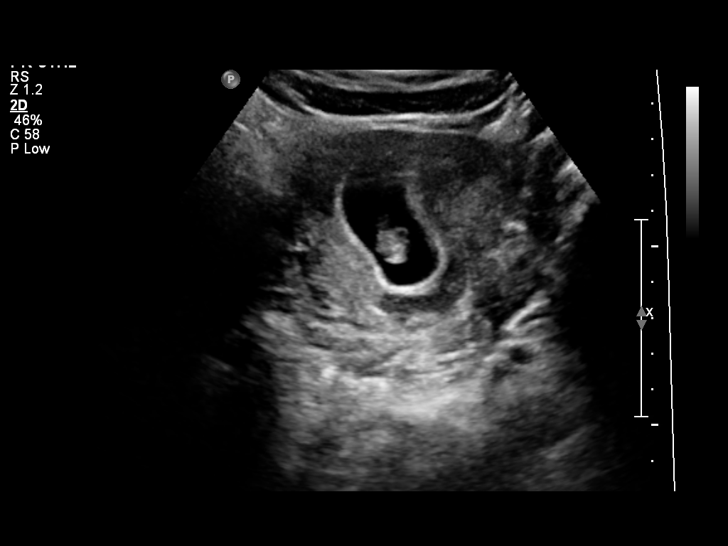
[im 6/17]
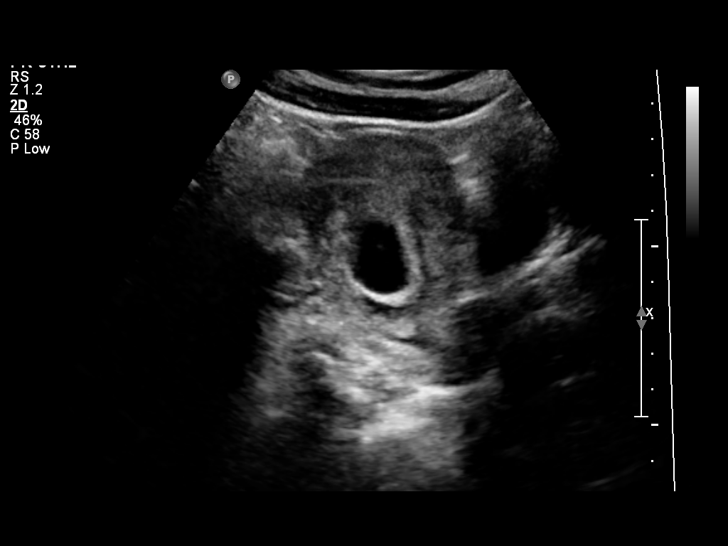
[im 7/17]
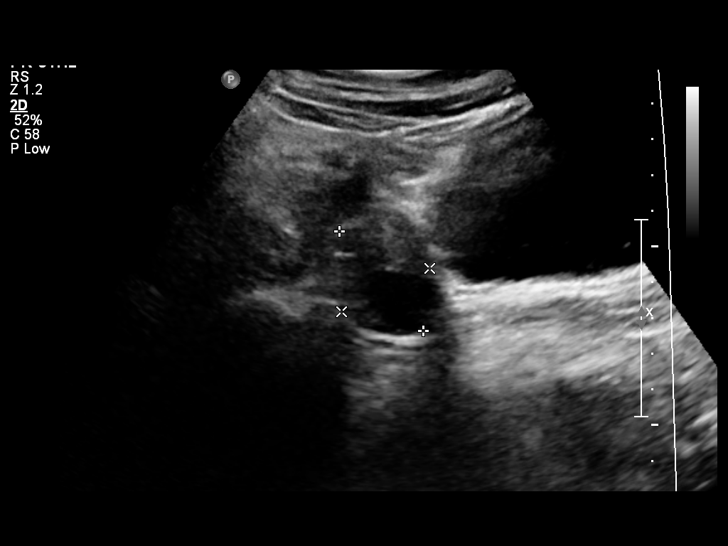
[im 8/17]
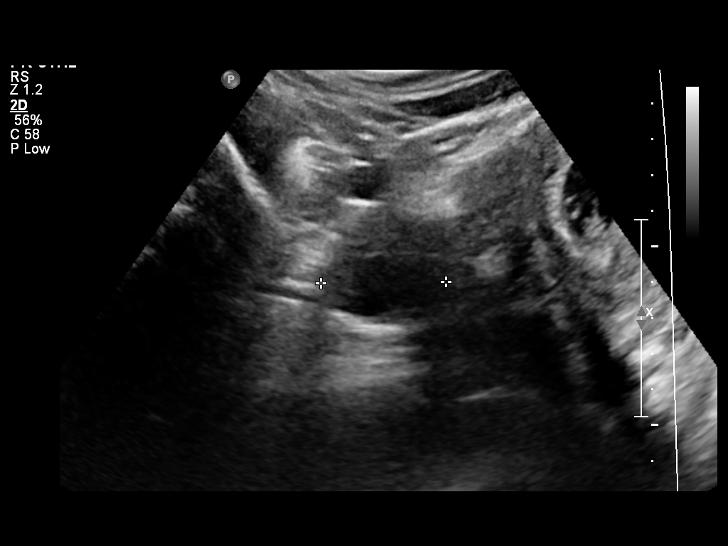
[im 10/17]
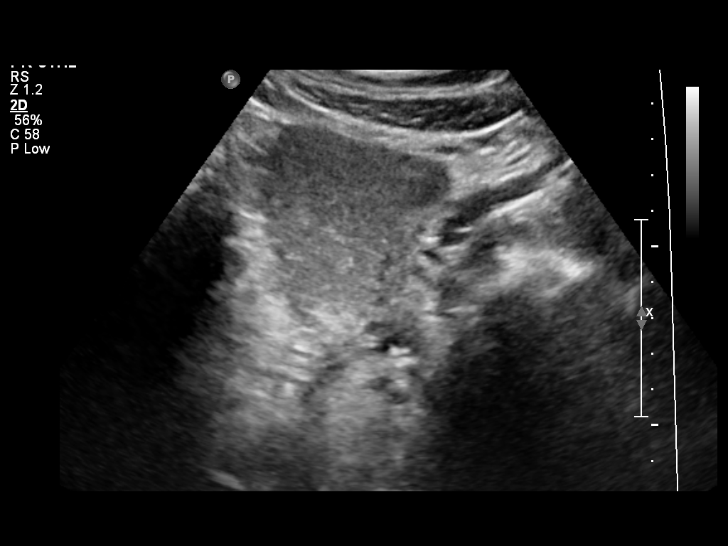
[im 11/17]
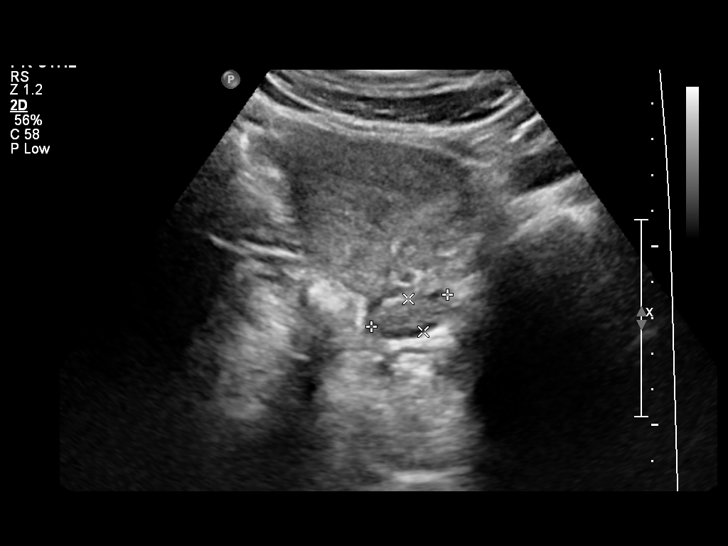
[im 12/17]
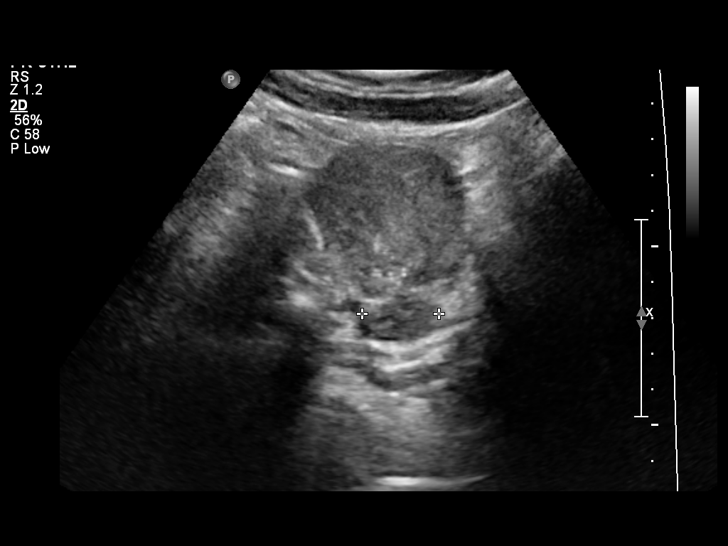
[im 13/17]
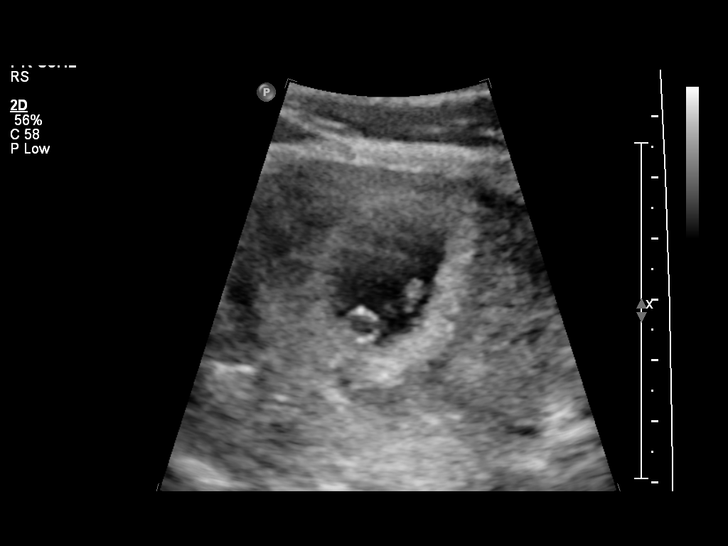
[im 14/17]
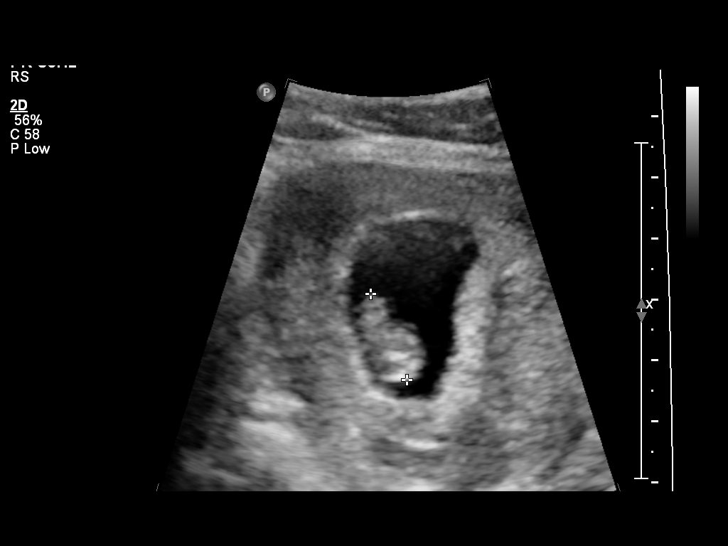
[im 16/17]
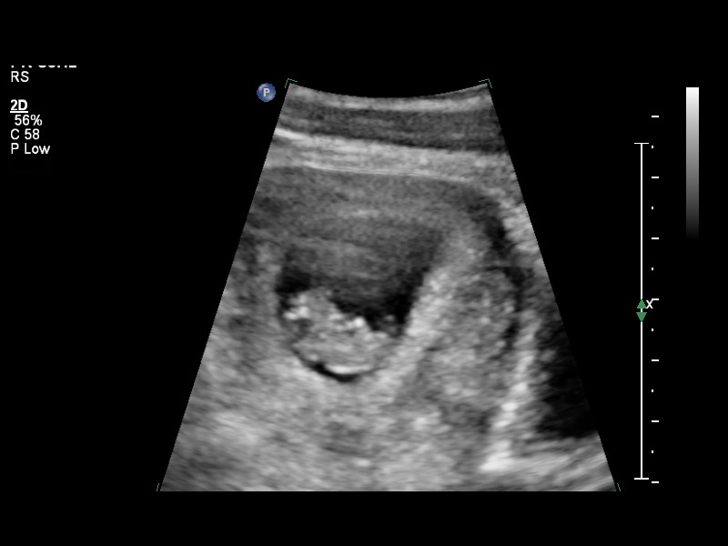
[im 17/17]
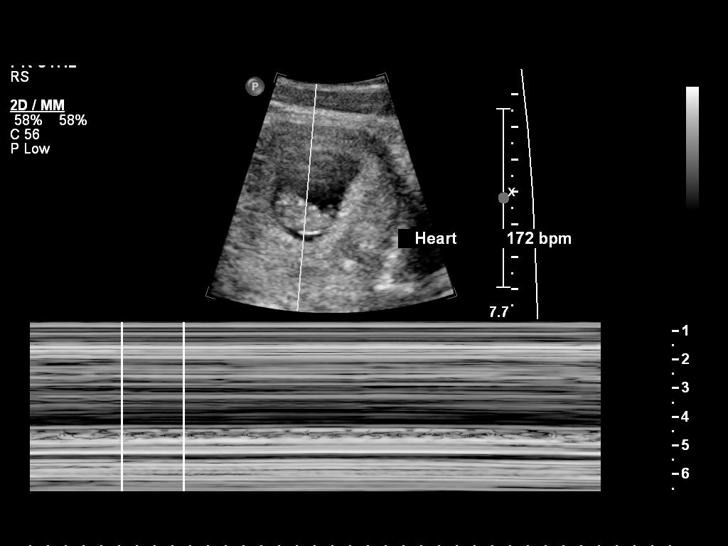

[14 of 17 positions shown; findings below may reference images not displayed]

01/15/2011

Intrauterine gestational sac:  Visualized/normal in shape.
Yolk sac: Yes
Embryo: Yes
Cardiac Activity: Yes
Heart Rate: 172 bpm

CRL: 1.9   cm  8   w  4   d         US EDC: 10/01/11

Maternal uterus/adnexae:
Geographic area of hypo echogenicity adacent to the gestational sac
represents a small subchorionic hemorrhage.

The ovaries are within normal limits.  The right ovary measures
x 2.7 x 3.5 cm.  Left ovary measures 2.3 x 1.0 x 2.1 cm.  No free
fluid within the pelvis.
IMPRESSION: 1.  Single intrauterine pregnancy with measured crown length
corresponding to a gestational age of 8 weeks, 4 days, reflecting a
date of delivery of 09/21/2011.  Cardiac activity detected with
heart rate of 172 beats per minute.

2.  Small subchorionic hemorrhage.

## 2011-12-29 NOTE — Discharge Summary (Signed)
Physician Discharge Summary  Patient ID: [name] MRN: [id number] DOB/AGE: [birth year] 21 y.o.  Admit date: 12/16/2011 Discharge date: 12/22/2011   Discharge Diagnoses:   Sickle cell crisis. Right leg pain with myositis. Situational stress. History of tobacco abuse.  Discharged Condition: Improved.  Operations/Procedues: none   Hospital Course: see admission H&P for details. Patient was admitted to the hospital per hospitalist service for further treatment of bilateral thigh pain. She was associating this with her sickle cell crisis. She was restarted on IV fluids for hydration. She was given IV Dilaudid for pain control. Her nausea and pruritus was managed as well. She was transferred to the sickle cell service the subsequent day. She was making gradual improvement thereafter. Patient was continued on IV fluids. IV pain medication was gradually tapered. Notably her CPK enzymes were elevated suggestive of a mild myositis. This improved as well. Patient did not demonstrate evidence of active hemolysis. She did not require exchange transfusions. By 8/8 she was doing much better. She felt that she could manage her pain at home with oral medications. She was subsequently discharged   Disposition: 01-Home or Self Care  Discharge Orders    Future Orders Please Complete By Expires   Diet - low sodium heart healthy      Increase activity slowly      No wound care      Call MD for:  severe uncontrolled pain      Discharge instructions      Comments:   Follow up with primary care physician in one week.     Medication List  As of 12/29/2011  1:35 AM   TAKE these medications         ALPRAZolam 0.25 MG tablet   Commonly known as: XANAX   Take 0.25-0.5 mg by mouth every 3 (three) hours as needed. For anxiety      cholecalciferol 1000 UNITS tablet   Commonly known as: VITAMIN D   Take 1,000 Units by mouth daily.      cyclobenzaprine 5 MG tablet   Commonly known as: FLEXERIL   Take 5 mg by mouth 2 (two) times daily as needed. muscle spasms.      fluvoxaMINE 50 MG tablet   Commonly known as: LUVOX   Take 1 tablet (50 mg total) by mouth at bedtime.      folic acid 1 MG tablet   Commonly known as: FOLVITE   Take 1 mg by mouth daily.      hydroxyurea 500 MG capsule   Commonly known as: HYDREA   Take 1,000 mg by mouth daily. May take with food to minimize GI side effects.      metoprolol succinate 12.5 mg Tb24   Commonly known as: TOPROL-XL   Take 0.5 tablets (12.5 mg total) by mouth daily.      morphine 15 MG 12 hr tablet   Commonly known as: MS CONTIN   Take 15 mg by mouth 2 (two) times daily as needed. For severe pain      Oxycodone HCl 10 MG Tabs   Take 15 mg by mouth 4 (four) times daily as needed. For pain.      pantoprazole 20 MG tablet   Commonly known as: PROTONIX   Take 20 mg by mouth daily.          time spent on discharge planning greater than 35 minutes   Signed: Jamas Jaquay 12/29/2011, 1:35 AM

## 2012-01-05 IMAGING — US US OB TRANSVAGINAL
1 series · 14 of 28 positions shown · non-contrast
Comparison: none

CLINICAL DATA: Spontaneous abortion.  Evaluate for retained
products.

OBSTETRIC <14 WK ULTRASOUND, TRANSVAGINAL OB US, AND FETAL NUCHAL
TRANSLUCENCY US
TECHNIQUE: Transabdominal and transvaginal ultrasound was
performed for evaluation of the gestation as well as the maternal
uterus and adnexal regions.

[Series 1: us ob transvaginal · 0.28mm/px · 37 acquisitions, 14 frames shown]
[im 2/37]
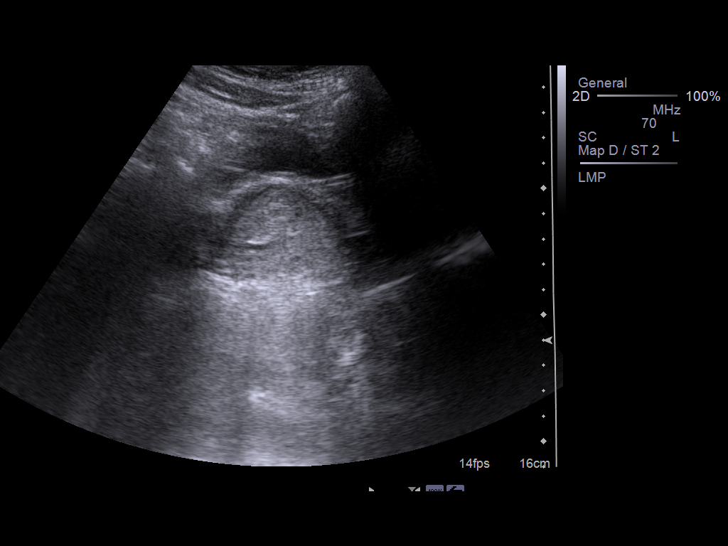
[im 5/37]
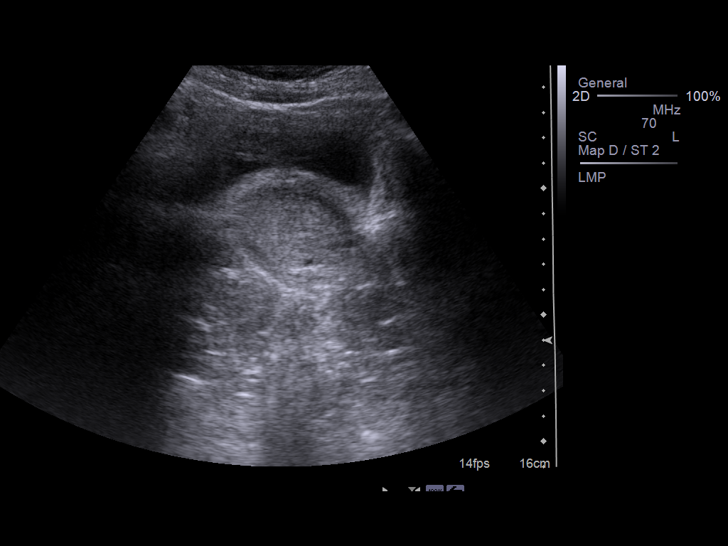
[im 7/37]
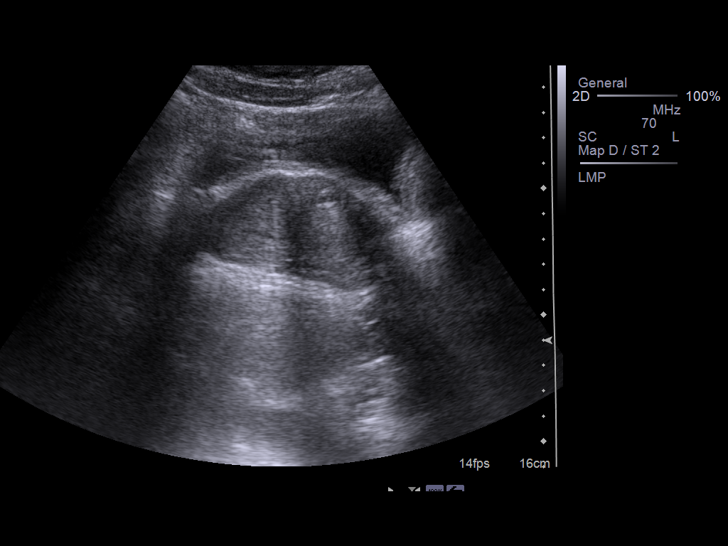
[im 10/37]
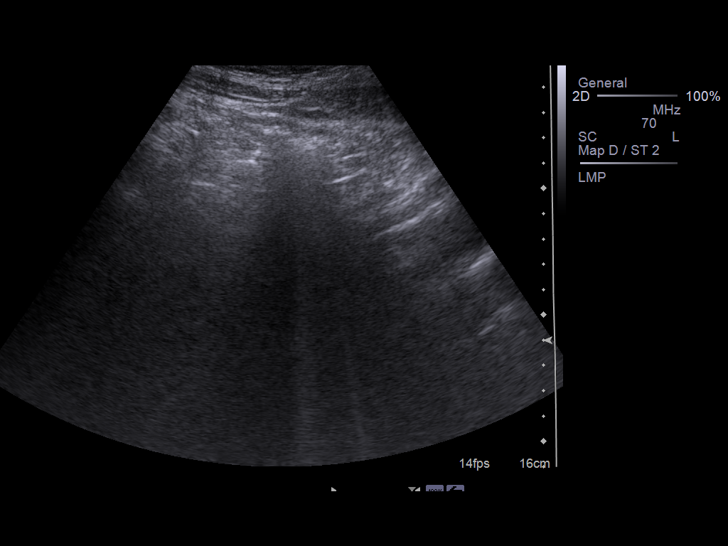
[im 13/37]
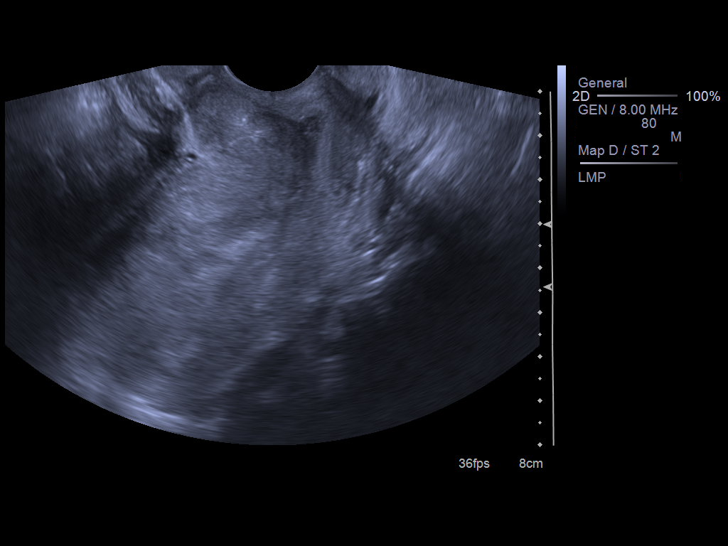
[im 15/37]
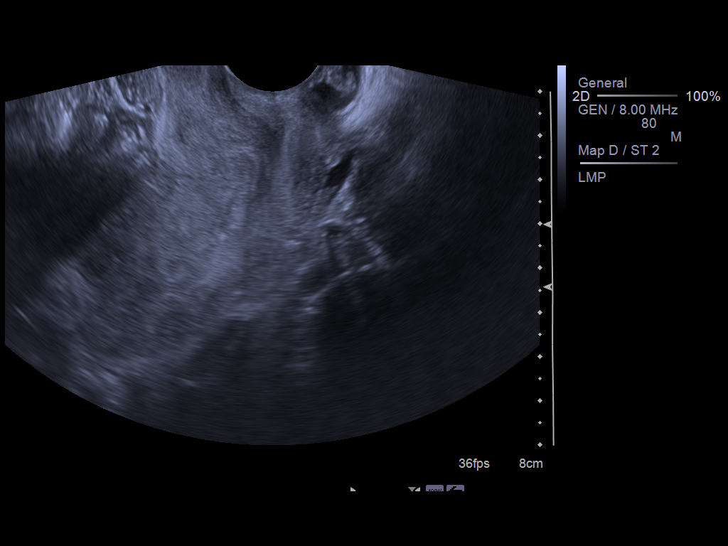
[im 18/37]
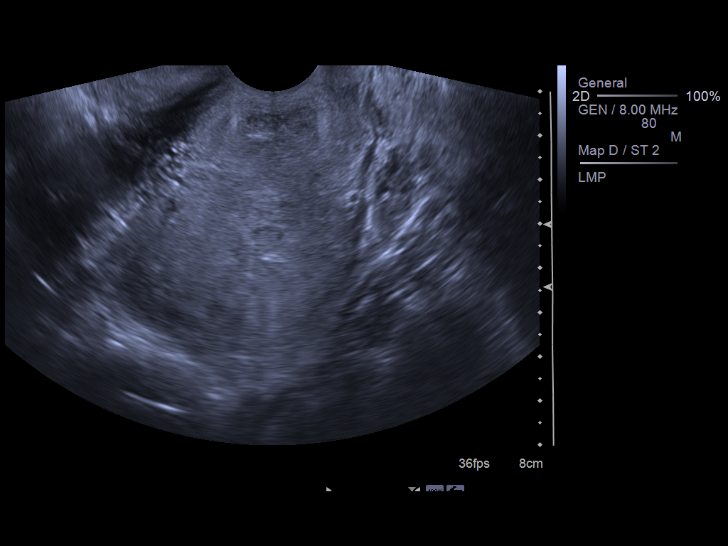
[im 21/37]
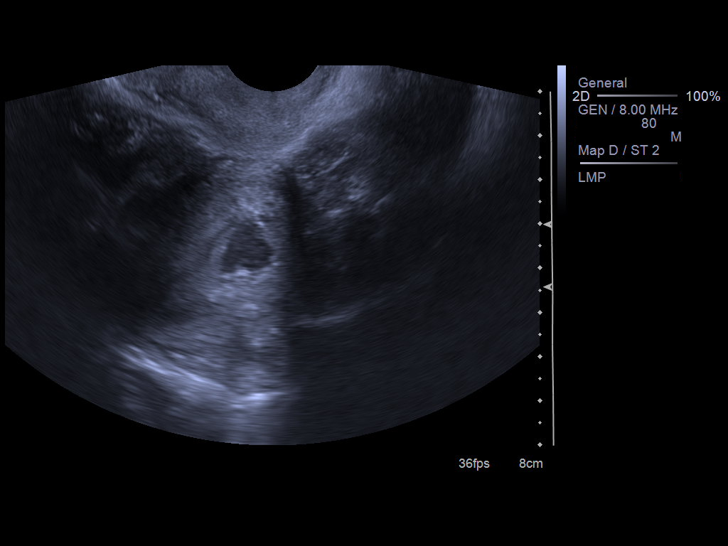
[im 23/37]
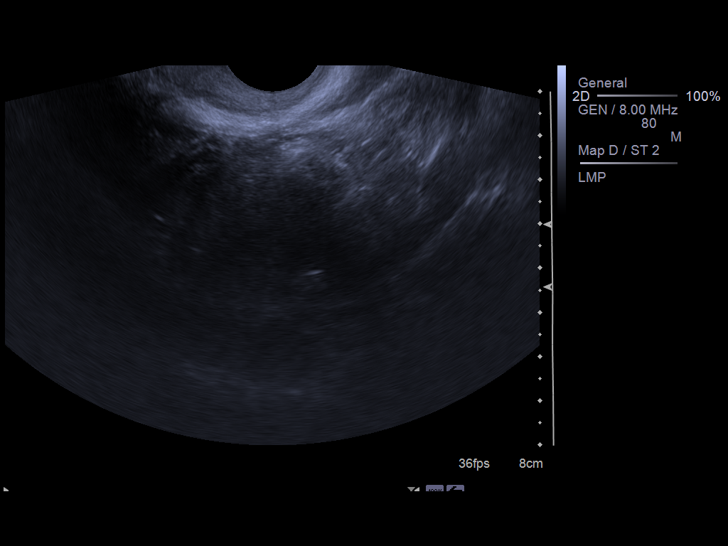
[im 26/37]
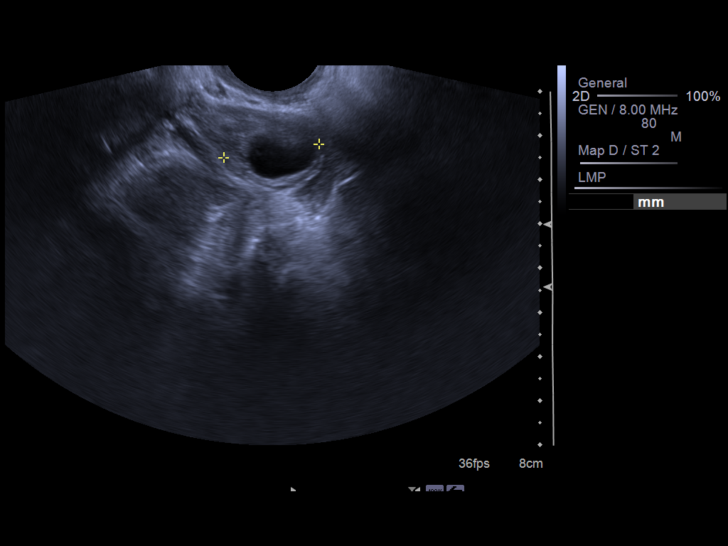
[im 29/37]
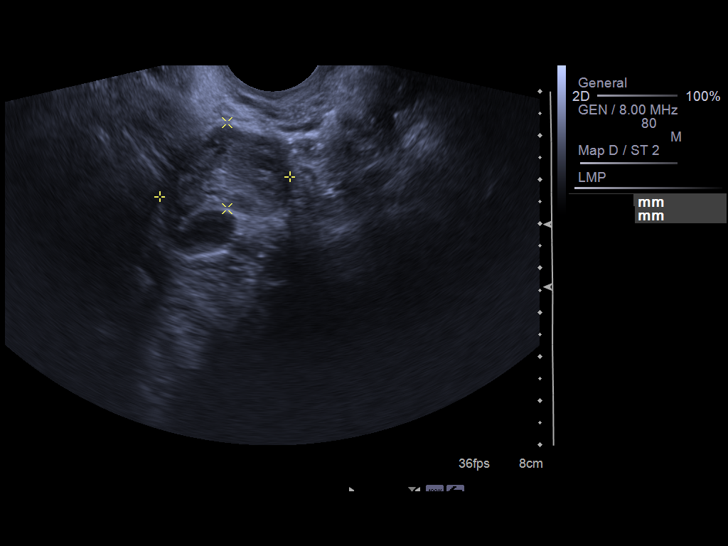
[im 31/37]
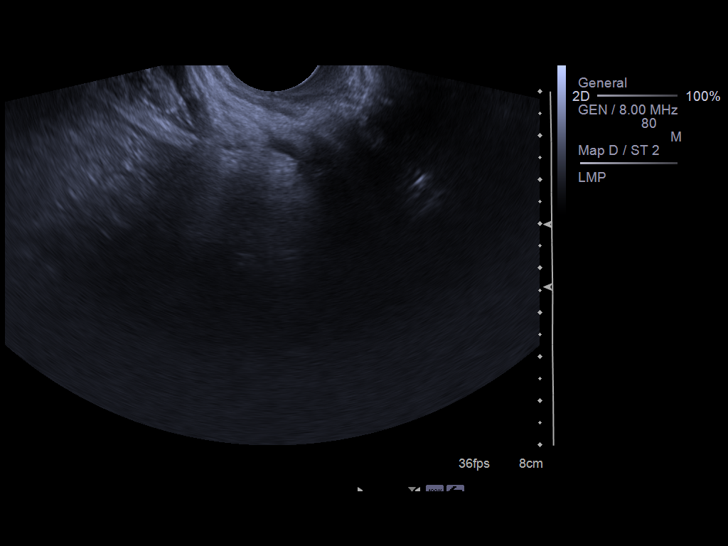
[im 34/37]
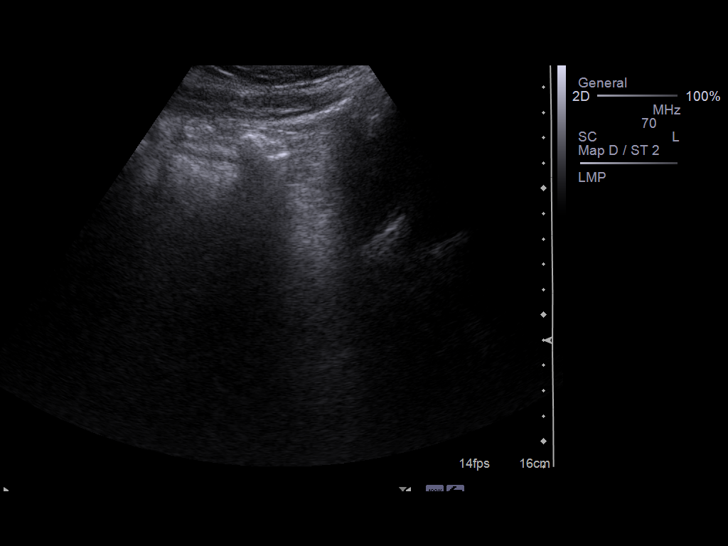
[im 37/37]
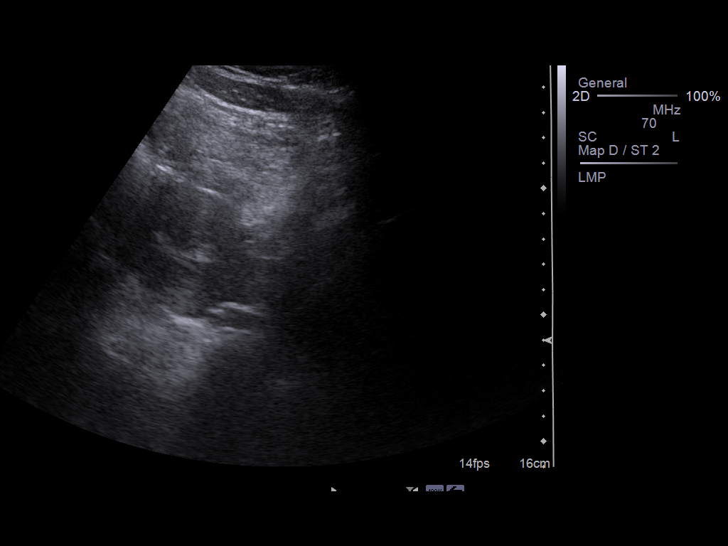

[14 of 28 positions shown; findings below may reference images not displayed]

FINDINGS: The uterus measures 8.2 x 4.8 x 5.8 cm.  There is a small amount of
debris and fluid within the endometrial cavity.  No gestational sac
or embryo identified.

Maternal uterus/adnexae: There is a 1.6 cm cyst in the right ovary.
The right ovary is otherwise normal measuring 3 x 1.5 x 2.2 cm.

The left ovary appears normal measuring 3.0 x 1.9 x 1.2 cm.

A small amount of free fluid is noted within the pelvis.
IMPRESSION: 1.  Findings compatible with a spontaneous abortion.
2.  Debris fluid within the endometrial cavity.
3.  Right ovarian cyst.

## 2012-01-08 ENCOUNTER — Inpatient Hospital Stay (HOSPITAL_COMMUNITY)
Admission: AD | Admit: 2012-01-08 | Discharge: 2012-01-12 | DRG: 812 | Disposition: A | Discharge status: Home / Self Care | Payer: Medicaid Other | Attending: Internal Medicine | Admitting: Internal Medicine

## 2012-01-08 ENCOUNTER — Emergency Department (HOSPITAL_COMMUNITY)
Admission: EM | Admit: 2012-01-08 | Discharge: 2012-01-08 | Disposition: A | Discharge status: Nursing Home (Includes ICF) | Payer: Medicaid Other | Source: Home / Self Care | Attending: Emergency Medicine | Admitting: Emergency Medicine

## 2012-01-08 ENCOUNTER — Emergency Department (HOSPITAL_COMMUNITY): Payer: Medicaid Other

## 2012-01-08 ENCOUNTER — Encounter (HOSPITAL_COMMUNITY): Payer: Self-pay | Admitting: *Deleted

## 2012-01-08 DIAGNOSIS — D571 Sickle-cell disease without crisis: Secondary | ICD-10-CM

## 2012-01-08 DIAGNOSIS — D72829 Elevated white blood cell count, unspecified: Secondary | ICD-10-CM | POA: Diagnosis present

## 2012-01-08 DIAGNOSIS — F430 Acute stress reaction: Secondary | ICD-10-CM | POA: Diagnosis present

## 2012-01-08 DIAGNOSIS — M545 Low back pain, unspecified: Secondary | ICD-10-CM | POA: Diagnosis present

## 2012-01-08 DIAGNOSIS — F3289 Other specified depressive episodes: Secondary | ICD-10-CM | POA: Diagnosis present

## 2012-01-08 DIAGNOSIS — G8929 Other chronic pain: Secondary | ICD-10-CM | POA: Diagnosis present

## 2012-01-08 DIAGNOSIS — F329 Major depressive disorder, single episode, unspecified: Secondary | ICD-10-CM | POA: Diagnosis present

## 2012-01-08 DIAGNOSIS — K5900 Constipation, unspecified: Secondary | ICD-10-CM | POA: Diagnosis not present

## 2012-01-08 DIAGNOSIS — D5700 Hb-SS disease with crisis, unspecified: Principal | ICD-10-CM | POA: Diagnosis present

## 2012-01-08 DIAGNOSIS — G43909 Migraine, unspecified, not intractable, without status migrainosus: Secondary | ICD-10-CM | POA: Diagnosis present

## 2012-01-08 DIAGNOSIS — M79609 Pain in unspecified limb: Secondary | ICD-10-CM | POA: Diagnosis present

## 2012-01-08 DIAGNOSIS — E669 Obesity, unspecified: Secondary | ICD-10-CM | POA: Diagnosis present

## 2012-01-08 DIAGNOSIS — K219 Gastro-esophageal reflux disease without esophagitis: Secondary | ICD-10-CM | POA: Diagnosis present

## 2012-01-08 LAB — URINE MICROSCOPIC-ADD ON

## 2012-01-08 LAB — URINALYSIS, ROUTINE W REFLEX MICROSCOPIC
Bilirubin Urine: NEGATIVE
Nitrite: NEGATIVE
Specific Gravity, Urine: 1.014 (ref 1.005–1.030)
pH: 7 (ref 5.0–8.0)

## 2012-01-08 LAB — CBC WITH DIFFERENTIAL/PLATELET
Basophils Absolute: 0.1 10*3/uL (ref 0.0–0.1)
Eosinophils Absolute: 0.3 10*3/uL (ref 0.0–0.7)
Lymphs Abs: 2.5 10*3/uL (ref 0.7–4.0)
MCH: 31.6 pg (ref 26.0–34.0)
Neutrophils Relative %: 64 % (ref 43–77)
Platelets: 515 10*3/uL — ABNORMAL HIGH (ref 150–400)
RBC: 3.29 MIL/uL — ABNORMAL LOW (ref 3.87–5.11)
RDW: 15.1 % (ref 11.5–15.5)
WBC: 11.5 10*3/uL — ABNORMAL HIGH (ref 4.0–10.5)

## 2012-01-08 LAB — COMPREHENSIVE METABOLIC PANEL
ALT: 12 U/L (ref 0–35)
AST: 17 U/L (ref 0–37)
Albumin: 4.3 g/dL (ref 3.5–5.2)
Alkaline Phosphatase: 61 U/L (ref 39–117)
GFR calc Af Amer: >90 mL/min (ref >90)
Glucose, Bld: 87 mg/dL (ref 70–99)
Potassium: 3.6 mEq/L (ref 3.5–5.1)
Sodium: 138 mEq/L (ref 135–145)
Total Protein: 7.6 g/dL (ref 6.0–8.3)

## 2012-01-08 LAB — RETICULOCYTES
RBC.: 3.29 MIL/uL — ABNORMAL LOW (ref 3.87–5.11)
Retic Count, Absolute: 424.4 10*3/uL — ABNORMAL HIGH (ref 19.0–186.0)

## 2012-01-08 MED ORDER — SODIUM CHLORIDE 0.9 % IV SOLN
INTRAVENOUS | Status: DC
  Administered 2012-01-08: 19:00:00 via INTRAVENOUS

## 2012-01-08 MED ORDER — DIPHENHYDRAMINE HCL 50 MG/ML IJ SOLN
12.5000 mg | INTRAMUSCULAR | Status: DC | PRN
  Administered 2012-01-08 – 2012-01-09 (×3): 25 mg via INTRAVENOUS
  Administered 2012-01-09: 12.5 mg via INTRAVENOUS
  Administered 2012-01-09: 25 mg via INTRAVENOUS
  Filled 2012-01-08 (×6): qty 1

## 2012-01-08 MED ORDER — HYDROMORPHONE HCL PF 2 MG/ML IJ SOLN
2.0000 mg | INTRAMUSCULAR | Status: DC | PRN
  Administered 2012-01-08 (×2): 4 mg via INTRAVENOUS
  Administered 2012-01-09: 2 mg via INTRAVENOUS
  Administered 2012-01-09: 4 mg via INTRAVENOUS
  Administered 2012-01-09 (×3): 2 mg via INTRAVENOUS
  Administered 2012-01-09: 4 mg via INTRAVENOUS
  Administered 2012-01-09: 3 mg via INTRAVENOUS
  Administered 2012-01-09: 4 mg via INTRAVENOUS
  Administered 2012-01-09: 3 mg via INTRAVENOUS
  Administered 2012-01-10 (×4): 4 mg via INTRAVENOUS
  Administered 2012-01-10: 2 mg via INTRAVENOUS
  Administered 2012-01-11 (×4): 4 mg via INTRAVENOUS
  Filled 2012-01-08: qty 1
  Filled 2012-01-08 (×2): qty 2
  Filled 2012-01-08: qty 1
  Filled 2012-01-08 (×4): qty 2
  Filled 2012-01-08: qty 1
  Filled 2012-01-08 (×10): qty 2
  Filled 2012-01-08: qty 1
  Filled 2012-01-08: qty 2

## 2012-01-08 MED ORDER — HYDROMORPHONE HCL PF 2 MG/ML IJ SOLN
2.0000 mg | Freq: Once | INTRAMUSCULAR | Status: AC
  Administered 2012-01-08: 2 mg via INTRAVENOUS
  Filled 2012-01-08: qty 1

## 2012-01-08 MED ORDER — ONDANSETRON HCL 4 MG/2ML IJ SOLN
4.0000 mg | Freq: Once | INTRAMUSCULAR | Status: AC
  Administered 2012-01-08: 4 mg via INTRAVENOUS
  Filled 2012-01-08: qty 2

## 2012-01-08 MED ORDER — ONDANSETRON HCL 4 MG PO TABS: 4.0000 mg | ORAL_TABLET | ORAL | Status: DC | PRN

## 2012-01-08 MED ORDER — FOLIC ACID 1 MG PO TABS
1.0000 mg | ORAL_TABLET | Freq: Every day | ORAL | Status: DC
  Administered 2012-01-09 – 2012-01-12 (×4): 1 mg via ORAL
  Filled 2012-01-08 (×4): qty 1

## 2012-01-08 MED ORDER — ONDANSETRON HCL 4 MG/2ML IJ SOLN
4.0000 mg | INTRAMUSCULAR | Status: DC | PRN
  Administered 2012-01-08 – 2012-01-12 (×9): 4 mg via INTRAVENOUS
  Filled 2012-01-08 (×9): qty 2

## 2012-01-08 MED ORDER — DIPHENHYDRAMINE HCL 25 MG PO CAPS
25.0000 mg | ORAL_CAPSULE | ORAL | Status: DC | PRN
  Administered 2012-01-10 – 2012-01-11 (×2): 25 mg via ORAL
  Filled 2012-01-08: qty 1
  Filled 2012-01-08: qty 2

## 2012-01-08 MED ORDER — DEXTROSE-NACL 5-0.45 % IV SOLN
INTRAVENOUS | Status: DC
  Administered 2012-01-08 – 2012-01-09 (×3): via INTRAVENOUS
  Administered 2012-01-09: 1000 mL via INTRAVENOUS
  Administered 2012-01-10 – 2012-01-12 (×4): via INTRAVENOUS

## 2012-01-08 NOTE — ED Notes (Signed)
IV team at the bedside to attempt IV start

## 2012-01-08 NOTE — ED Notes (Signed)
IV attempt x 3 - unsuccessful

## 2012-01-08 NOTE — Progress Notes (Signed)
Received call from [facility] ER from Dr. Chaney Malling at [phone]. Stated pt had entered ER  To seek treated for severe leg pain. Dr. Silverio Lay requested pt to be transferred to center for IV pain control.  Dr. Algie Coffer  Paged for request for patient treatment. Verbal consent given by Dr. Algie Coffer to transfer patient to [facility] and begin treatment per protocol standing orders. Pt is to be transferred to center at 1930. At 1755, Dr. Silverio Lay contacted  And  Informed of consent for treatment. Arrangements  To be made  To transfer patient to [facility] at 1930 by [facility].

## 2012-01-08 NOTE — ED Notes (Signed)
To ED for eval of nausea, irregular menses, and breast pain since last Thursday when her menses started. Prior menses was only 2 weeks ago. Also c/o right upper leg pain. Pt states she 'just came off the depo shot'. Appears in nad.

## 2012-01-08 NOTE — ED Provider Notes (Signed)
History     CSN: [id number]  Arrival date & time 01/08/12  1204   First MD Initiated Contact with Patient 01/08/12 1515      Chief Complaint  Patient presents with  . Breast Pain  . Nausea    (Consider location/radiation/quality/duration/timing/severity/associated sxs/prior treatment) The history is provided by the patient.   [name] is a 21 y.o. female hx of sickle cell disease, depression, here with R leg pain. R leg pain since yesterday. Took her oxycodone and it hasn't improved. No injury or fever. This is her typical crisis. Last time, she was admitted 2 weeks ago for a week. Denies any CP or SOB. She usually gets into crisis when she has menstrual bleeding and she started bleeding 10 days ago. She also has breast pain bilaterally since having the menses. No hx of acute chest.    Past Medical History  Diagnosis Date  . H/O: 1 miscarriage 03/22/2011  . 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 Depression 01/06/2011  . GERD (gastroesophageal reflux disease) 02/17/2011  . Trichotillomania     h/o  . Blood transfusion     "lots"  . Sickle cell anemia with crisis   . Exertional dyspnea     "sometimes"  . Sickle cell anemia   . Headache   . Migraines 11/08/11    "@ least twice/month"  . Chronic back pain     "very severe; have knot in my back; from tight muscle; take RX and exercise for it"  . Mood swings 11/08/11    "I go back and forth; real bad"    Past Surgical History  Procedure Date  . Cholecystectomy 05/2010  . Dilation and curettage of uterus 02/20/11    S/P miscarriage    History reviewed. No pertinent family history.  History  Substance Use Topics  . Smoking status: Never Smoker   . Smokeless tobacco: Never Used  . Alcohol Use: No     pt states she quit in May    OB History    Grav Para Term Preterm Abortions TAB SAB Ect Mult Living   1    1           Review of Systems  Musculoskeletal: Positive for myalgias.  All other systems reviewed and are  negative.    Allergies  Latex and Toradol  Home Medications   Current Outpatient Rx  Name Route Sig Dispense Refill  . VITAMIN D 1000 UNITS PO TABS Oral Take 1,000 Units by mouth daily.    Marland Kitchen FOLIC ACID 1 MG PO TABS Oral Take 1 mg by mouth daily.    Marland Kitchen HYDROXYUREA 500 MG PO CAPS Oral Take 1,000 mg by mouth daily. May take with food to minimize GI side effects.    Marland Kitchen METOPROLOL SUCCINATE 12.5 MG HALF TABLET Oral Take 0.5 tablets (12.5 mg total) by mouth daily. 60 tablet 0  . MORPHINE SULFATE ER 15 MG PO TBCR Oral Take 15 mg by mouth 2 (two) times daily as needed. For severe pain    . OXYCODONE HCL 10 MG PO TABS Oral Take 15 mg by mouth 4 (four) times daily as needed. For pain.     Marland Kitchen PANTOPRAZOLE SODIUM 20 MG PO TBEC Oral Take 20 mg by mouth daily.      BP 118/60  Pulse 84  Temp 98.5 F (36.9 C) (Oral)  Resp 18  SpO2 100%  LMP 01/05/2012  Physical Exam  Nursing note and vitals reviewed. Constitutional: She is oriented to  person, place, and time.       Uncomfortable   HENT:  Head: Normocephalic.  Mouth/Throat: Oropharynx is clear and moist.  Eyes: Conjunctivae are normal. Pupils are equal, round, and reactive to light.  Neck: Normal range of motion. Neck supple.  Cardiovascular: Normal rate, regular rhythm and normal heart sounds.   Pulmonary/Chest: Effort normal and breath sounds normal.       Bilateral breast tender, no masses palpable. No nipple discharge.   Abdominal: Soft. Bowel sounds are normal.  Musculoskeletal: Normal range of motion.       Tenderness over R hip area but normal ROM and strength. Nl sensation bilateral lower extremities.   Neurological: She is alert and oriented to person, place, and time.  Skin: Skin is warm and dry.  Psychiatric: She has a normal mood and affect. Her behavior is normal. Judgment and thought content normal.    ED Course  Procedures (including critical care time)  Labs Reviewed  URINALYSIS, ROUTINE W REFLEX MICROSCOPIC -  Abnormal; Notable for the following:    APPearance CLOUDY (*)     Hgb urine dipstick LARGE (*)     All other components within normal limits  URINE MICROSCOPIC-ADD ON - Abnormal; Notable for the following:    Squamous Epithelial / LPF FEW (*)     All other components within normal limits  CBC WITH DIFFERENTIAL - Abnormal; Notable for the following:    WBC 11.5 (*)     RBC 3.29 (*)     Hemoglobin 10.4 (*)     HCT 29.8 (*)     Platelets 515 (*)     Monocytes Absolute 1.3 (*)     All other components within normal limits  COMPREHENSIVE METABOLIC PANEL - Abnormal; Notable for the following:    BUN 3 (*)     Total Bilirubin 2.4 (*)     All other components within normal limits  RETICULOCYTES - Abnormal; Notable for the following:    Retic Ct Pct 12.9 (*)     RBC. 3.29 (*)     Retic Count, Manual 424.4 (*)     All other components within normal limits  POCT PREGNANCY, URINE  CK   Dg Chest 2 View  01/08/2012  *RADIOLOGY REPORT*  Clinical Data: Chest pain.  Sickle cell crisis.  CHEST - 2 VIEW  Comparison: 12/17/2011.  Findings: The cardiac silhouette, mediastinal and hilar contours are within normal limits and stable.  The lungs are clear of acute process.  Minimal stable scarring changes.  No pleural effusion. The bony thorax is intact.  IMPRESSION: No acute cardiopulmonary findings.   Original Report Authenticated By: P. Loralie Champagne, M.D.    Dg Hip Complete Right  01/08/2012  *RADIOLOGY REPORT*  Clinical Data: Right hip pain.  RIGHT HIP - COMPLETE 2+ VIEW  Comparison: 11/24/2011.  Findings: Both hips are normally located.  No significant degenerative changes.  No evidence of stress fracture or avascular necrosis.  Stable area of mild sclerosis in the central head neck junction region on the left side.  The pubic symphysis and SI joints are intact.  No pelvic fractures.  IMPRESSION: No acute bony findings.   Original Report Authenticated By: P. Loralie Champagne, M.D.      1. Sickle cell  anemia       MDM  [name] is a 21 y.o. female hx of sickle cell disease here with crisis. Will get CBC, CMP, reticulocyte count, CK level (r/o myositis as previous admission),  cxr, R hip xray to r/o osteo. Will give dilaudid and IV fluids.   6:54 PM Labs at baseline, reticulocyte count appropriate. CXR, R hip xray nl. Discussed with Dr. Roseanne Kaufman NP at sickle cell clinic, who accepted the patient.       Richardean Canal, MD 01/08/12 [phone]

## 2012-01-09 LAB — COMPREHENSIVE METABOLIC PANEL
ALT: 12 U/L (ref 0–35)
AST: 17 U/L (ref 0–37)
Alkaline Phosphatase: 63 U/L (ref 39–117)
CO2: 23 mEq/L (ref 19–32)
Calcium: 8.7 mg/dL (ref 8.4–10.5)
Chloride: 101 mEq/L (ref 96–112)
GFR calc non Af Amer: >90 mL/min (ref >90)
Potassium: 3.7 mEq/L (ref 3.5–5.1)
Sodium: 134 mEq/L — ABNORMAL LOW (ref 135–145)
Total Bilirubin: 2.3 mg/dL — ABNORMAL HIGH (ref 0.3–1.2)

## 2012-01-09 LAB — CBC
Hemoglobin: 9.7 g/dL — ABNORMAL LOW (ref 12.0–15.0)
Platelets: 493 10*3/uL — ABNORMAL HIGH (ref 150–400)
RBC: 2.95 MIL/uL — ABNORMAL LOW (ref 3.87–5.11)
WBC: 17.4 10*3/uL — ABNORMAL HIGH (ref 4.0–10.5)

## 2012-01-09 NOTE — Progress Notes (Signed)
Patient reported leg pain scale 7/10. Refused IV pain medication coverage as of this time.

## 2012-01-09 NOTE — Progress Notes (Signed)
At 1840,  Pt transferred to 3 west via W/C  By [employer]. Report given to [facility]

## 2012-01-09 NOTE — Progress Notes (Signed)
Patient requesting how to get status banning of boyfriend from hospital changed. Instructed patient to make appointment after discharge to speak with nursing management concerning the matter. Patient verbalizes understanding.

## 2012-01-09 NOTE — Progress Notes (Addendum)
Pt c/o back pain, requesting heating pad and tennis ball for back, NP notified, orders received and initiated. To continue to monitor.

## 2012-01-09 NOTE — Progress Notes (Signed)
Subjective: [name] was emotionally distress today. She discussed her perception of her dysfunctional childhood, not being accepted by peers and always feeling lost needing and wanting to be love. She feels she has found this in her boyfriend because it seems he may have come from a dysfunctional childhood. During this long encounter she was very tearful. Recently started school which may contribute to another situational stressor. She does not feel she is able to manage her pain at home and requesting admission. In review of labs she is in active hemolysis and will admit for acute/pain management.    Objective: Vital signs in last 24 hours: Filed Vitals:   01/09/12 0609 01/09/12 1015 01/09/12 1430 01/09/12 1900  BP: 127/56 114/57 98/58 113/64  Pulse: 110 100 102 108  Temp: 98.8 F (37.1 C) 98.5 F (36.9 C) 98.6 F (37 C) 98.6 F (37 C)  TempSrc: Oral Oral Oral Oral  Resp:  20 20 22   SpO2:  96% 98% 95%   Weight change:  No intake or output data in the 24 hours ending 01/09/12 1938  Physical Exam:  General Appearance: Alert and oriented, cooperative, well nourished, well developed, mild distress  Head: Normocephalic, atraumatic  Eyes: PERRLA, EOMI, scleral icterus  Nose: Nares, septum and mucosa are normal, no visible drainage, no sinus tenderness  Throat: Lips, mucosa, tongue, teeth and gums are normal  Neck: Trachea is midline, no lymphophadenopathy, mass or thyromegaly  Back: Symmetric, no curvature, bilateral CVA tenderness, diffuse tenderness  Resp: Diminished breath sounds bibasilar and bilaterally, CTA, no wheezes/rales/rhonchi  Cardio: Regular rate and rhythm, S1, S2 normal, soft murmur, no click/rub/gallop  GI: Soft, distended, hypoactive bowel sounds, no organomegaly  Extremities: Atraumatic, no cyanosis, Homans sign is negative, Pulses: 2+ and symmetric  Skin: Skin color, texture and turgor are normal  Neurologic: CN II - XII intact  Psych: Flat affect    Lab  Results:  [facility] 01/09/12 0315 01/08/12 1641  NA 134* 138  K 3.7 3.6  CL 101 103  CO2 23 24  GLUCOSE 99 87  BUN 6 3*  CREATININE 0.88 0.53  CALCIUM 8.7 9.4  MG -- --  PHOS -- --    Basename 01/09/12 0315 01/08/12 1641  AST 17 17  ALT 12 12  ALKPHOS 63 61  BILITOT 2.3* 2.4*  PROT 7.2 7.6  ALBUMIN 4.1 4.3   No results found for this basename: LIPASE:2,AMYLASE:2 in the last 72 hours  Basename 01/09/12 0315 01/08/12 1641  WBC 17.4* 11.5*  NEUTROABS -- 7.4  HGB 9.7* 10.4*  HCT 26.6* 29.8*  MCV 90.2 90.6  PLT 493* 515*    Basename 01/08/12 1641  CKTOTAL 55  CKMB --  CKMBINDEX --  TROPONINI --   No components found with this basename: POCBNP:3 No results found for this basename: DDIMER:2 in the last 72 hours No results found for this basename: HGBA1C:2 in the last 72 hours No results found for this basename: CHOL:2,HDL:2,LDLCALC:2,TRIG:2,CHOLHDL:2,LDLDIRECT:2 in the last 72 hours No results found for this basename: TSH,T4TOTAL,FREET3,T3FREE,THYROIDAB in the last 72 hours  Basename 01/08/12 1641  VITAMINB12 --  FOLATE --  FERRITIN --  TIBC --  IRON --  RETICCTPCT 12.9*    Micro Results: No results found for this or any previous visit (from the past 240 hour(s)).  Studies/Results: Dg Chest 2 View  01/08/2012  *RADIOLOGY REPORT*  Clinical Data: Chest pain.  Sickle cell crisis.  CHEST - 2 VIEW  Comparison: 12/17/2011.  Findings: The cardiac silhouette, mediastinal  and hilar contours are within normal limits and stable.  The lungs are clear of acute process.  Minimal stable scarring changes.  No pleural effusion. The bony thorax is intact.  IMPRESSION: No acute cardiopulmonary findings.   Original Report Authenticated By: P. Loralie Champagne, M.D.    Dg Hip Complete Right  01/08/2012  *RADIOLOGY REPORT*  Clinical Data: Right hip pain.  RIGHT HIP - COMPLETE 2+ VIEW  Comparison: 11/24/2011.  Findings: Both hips are normally located.  No significant degenerative  changes.  No evidence of stress fracture or avascular necrosis.  Stable area of mild sclerosis in the central head neck junction region on the left side.  The pubic symphysis and SI joints are intact.  No pelvic fractures.  IMPRESSION: No acute bony findings.   Original Report Authenticated By: P. Loralie Champagne, M.D.     Medications:   Scheduled:   . folic acid  1 mg Oral Daily   Scheduled Meds:   . folic acid  1 mg Oral Daily   Continuous Infusions:   . dextrose 5 % and 0.45% NaCl 1,000 mL (01/09/12 1037)   PRN Meds:.diphenhydrAMINE, diphenhydrAMINE, HYDROmorphone (DILAUDID) injection, ondansetron (ZOFRAN) IV, ondansetron  Assessment/Plan: 1. Sickle Cell Pain Crisis: - with active hemolysis, IVF  and pain management. 2. Situational stressor : will refer to SW 3. Acute/Chronic: IV pain management  4. Leucocytosis:reactive v/s inflammatory process monitor for s/s of infection    LOS: 1 day  Florentine Diekman P 01/09/2012, 7:38 PM

## 2012-01-09 NOTE — Progress Notes (Signed)
Noted patient has visitor at bedside. Requested of patient if a visitor is allowed to be at facility. Patient states her visitor has been banned from the facility. Visitor leaves quietly on own accord. To continue to monitor.

## 2012-01-10 ENCOUNTER — Encounter (HOSPITAL_COMMUNITY): Payer: Self-pay | Admitting: *Deleted

## 2012-01-10 LAB — COMPREHENSIVE METABOLIC PANEL
ALT: 11 U/L (ref 0–35)
AST: 22 U/L (ref 0–37)
Albumin: 3.7 g/dL (ref 3.5–5.2)
Alkaline Phosphatase: 55 U/L (ref 39–117)
Potassium: 3.6 mEq/L (ref 3.5–5.1)
Sodium: 136 mEq/L (ref 135–145)
Total Protein: 6.7 g/dL (ref 6.0–8.3)

## 2012-01-10 LAB — CBC WITH DIFFERENTIAL/PLATELET
Basophils Relative: 1 % (ref 0–1)
Eosinophils Absolute: 0.4 10*3/uL (ref 0.0–0.7)
Hemoglobin: 8.2 g/dL — ABNORMAL LOW (ref 12.0–15.0)
MCH: 31.5 pg (ref 26.0–34.0)
MCHC: 34.9 g/dL (ref 30.0–36.0)
Monocytes Absolute: 1.9 10*3/uL — ABNORMAL HIGH (ref 0.1–1.0)
Monocytes Relative: 12 % (ref 3–12)
Neutrophils Relative %: 67 % (ref 43–77)
RDW: 17.3 % — ABNORMAL HIGH (ref 11.5–15.5)

## 2012-01-10 MED ORDER — HYDROMORPHONE HCL 4 MG PO TABS
4.0000 mg | ORAL_TABLET | Freq: Once | ORAL | Status: AC
  Administered 2012-01-10: 4 mg via ORAL
  Filled 2012-01-10: qty 1

## 2012-01-10 MED ORDER — SODIUM CHLORIDE 0.9 % IJ SOLN: 10.0000 mL | INTRAMUSCULAR | Status: DC | PRN

## 2012-01-10 MED ORDER — LACTULOSE 10 GM/15ML PO SOLN
20.0000 g | Freq: Two times a day (BID) | ORAL | Status: DC
  Filled 2012-01-10 (×2): qty 30

## 2012-01-10 NOTE — Care Management Note (Unsigned)
    Page 1 of 1   01/10/2012     4:09:05 PM   CARE MANAGEMENT NOTE 01/10/2012  Patient:  [name], [name]   Account Number:  [id number]  Date Initiated:  01/10/2012  Documentation initiated by:  Lanier Clam  Subjective/Objective Assessment:   ADMITTED W/SCC.READMIT-SCC 8/2-12/22/11.     Action/Plan:   FROM HOME W/PARENTS.   Anticipated DC Date:  01/16/2012   Anticipated DC Plan:  HOME/SELF CARE      DC Planning Services  CM consult      Choice offered to / List presented to:             Status of service:  In process, will continue to follow Medicare Important Message given?   (If response is "NO", the following Medicare IM given date fields will be blank) Date Medicare IM given:   Date Additional Medicare IM given:    Discharge Disposition:    Per UR Regulation:  Reviewed for med. necessity/level of care/duration of stay  If discussed at Long Length of Stay Meetings, dates discussed:    Comments:  01/10/12 [facility] RN,BSN NCM [phone]

## 2012-01-10 NOTE — Progress Notes (Signed)
Peripherally Inserted Central Catheter/Midline Placement  The IV Nurse has discussed with the patient and/or persons authorized to consent for the patient, the purpose of this procedure and the potential benefits and risks involved with this procedure.  The benefits include less needle sticks, lab draws from the catheter and patient may be discharged home with the catheter.  Risks include, but not limited to, infection, bleeding, blood clot (thrombus formation), and puncture of an artery; nerve damage and irregular heat beat.  Alternatives to this procedure were also discussed.  PICC/Midline Placement Documentation        Stacie Glaze Horton 01/10/2012, 4:50 PM

## 2012-01-10 NOTE — Progress Notes (Signed)
Subjective: [name] was in good spirit talking and laughing with tech when I entered the room. She continues to complain of pain in right leg and also states she has a migraine and had 1 episodes of nausea. Relief with Zofran      Rates her pain as  7/10.  Objective: Vital signs in last 24 hours: Filed Vitals:   01/09/12 2157 01/10/12 0243 01/10/12 0557 01/10/12 0922  BP: 126/76 118/74 101/59 96/48  Pulse: 107 104 105 91  Temp:  98.3 F (36.8 C) 98.4 F (36.9 C) 98.9 F (37.2 C)  TempSrc: Oral Oral Oral Oral  Resp: 19 19 14 16   Height:      Weight:   88.905 kg (196 lb)   SpO2: 95% 94% 93% 94%   Weight change:   Intake/Output Summary (Last 24 hours) at 01/10/12 1716 Last data filed at 01/10/12 1514  Gross per 24 hour  Intake 4004.58 ml  Output   3050 ml  Net 954.58 ml    Physical Exam: General Appearance: Alert and oriented, cooperative, well nourished, well developed,no acute distress  Head: Normocephalic, atraumatic  Eyes: PERRLA, EOMI, scleral icterus  Nose:  no visible drainage, no sinus tenderness  Throat: Lips, mucosa, tongue, teeth and gums are normal  Neck: Trachea is midline, no lymphophadenopathy, mass or thyromegaly  Back: Symmetric, no curvature, bilateral CVA tenderness, diffuse tenderness  Resp: Diminished breath sounds bibasilar and bilaterally, CTA, no wheezes/rales/rhonchi  Cardio: Regular rate and rhythm, S1, S2 normal, soft murmur, no click/rub/gallop  GI: Soft, distended, hypoactive bowel sounds, no organomegaly  Extremities: Atraumatic, no cyanosis, Homans sign is negative,  Pulses: 2+ and symmetric  Skin: Skin color, texture and turgor are normal  Neurologic: CN II - XII intact  Psych: appropriate affect   Lab Results:  [facility] 01/09/12 0315 01/08/12 1641  NA 134* 138  K 3.7 3.6  CL 101 103  CO2 23 24  GLUCOSE 99 87  BUN 6 3*  CREATININE 0.88 0.53  CALCIUM 8.7 9.4  MG -- --  PHOS -- --    Basename 01/09/12 0315 01/08/12 1641  AST  17 17  ALT 12 12  ALKPHOS 63 61  BILITOT 2.3* 2.4*  PROT 7.2 7.6  ALBUMIN 4.1 4.3   No results found for this basename: LIPASE:2,AMYLASE:2 in the last 72 hours  Basename 01/09/12 0315 01/08/12 1641  WBC 17.4* 11.5*  NEUTROABS -- 7.4  HGB 9.7* 10.4*  HCT 26.6* 29.8*  MCV 90.2 90.6  PLT 493* 515*    Basename 01/08/12 1641  CKTOTAL 55  CKMB --  CKMBINDEX --  TROPONINI --   No components found with this basename: POCBNP:3 No results found for this basename: DDIMER:2 in the last 72 hours No results found for this basename: HGBA1C:2 in the last 72 hours No results found for this basename: CHOL:2,HDL:2,LDLCALC:2,TRIG:2,CHOLHDL:2,LDLDIRECT:2 in the last 72 hours No results found for this basename: TSH,T4TOTAL,FREET3,T3FREE,THYROIDAB in the last 72 hours  Basename 01/08/12 1641  VITAMINB12 --  FOLATE --  FERRITIN --  TIBC --  IRON --  RETICCTPCT 12.9*    Micro Results: No results found for this or any previous visit (from the past 240 hour(s)).  Studies/Results: No results found.  Medications:   Scheduled:    . folic acid  1 mg Oral Daily  . HYDROmorphone  4 mg Oral Once   Scheduled Meds:    . folic acid  1 mg Oral Daily  . HYDROmorphone  4 mg Oral Once  Continuous Infusions:    . dextrose 5 % and 0.45% NaCl 50 mL/hr at 01/10/12 1714   PRN Meds:.diphenhydrAMINE, diphenhydrAMINE, HYDROmorphone (DILAUDID) injection, ondansetron (ZOFRAN) IV, ondansetron, sodium chloride  Assessment/Plan: 1. Sickle Cell Pain Crisis: - with active hemolysis, IVF  and pain, antiemetic and pruritus management.DVT prophylaxis,  2. Situational stressor : will refer to SW  3. Acute/Chronic: IV pain management  4. Leucocytosis:reactive v/s inflammatory process monitor for s/s of infection 5. Constipation: laxative     LOS: 2 days  Tahirah Sara P 01/10/2012, 5:16 PM

## 2012-01-11 LAB — CBC WITH DIFFERENTIAL/PLATELET
Basophils Relative: 0 % (ref 0–1)
Eosinophils Absolute: 0.6 10*3/uL (ref 0.0–0.7)
MCH: 32.3 pg (ref 26.0–34.0)
MCHC: 35.8 g/dL (ref 30.0–36.0)
Neutrophils Relative %: 66 % (ref 43–77)
Platelets: 460 10*3/uL — ABNORMAL HIGH (ref 150–400)
RBC: 2.57 MIL/uL — ABNORMAL LOW (ref 3.87–5.11)

## 2012-01-11 LAB — COMPREHENSIVE METABOLIC PANEL
ALT: 12 U/L (ref 0–35)
CO2: 28 mEq/L (ref 19–32)
Calcium: 9 mg/dL (ref 8.4–10.5)
Creatinine, Ser: 0.64 mg/dL (ref 0.50–1.10)
GFR calc Af Amer: >90 mL/min (ref >90)
GFR calc non Af Amer: >90 mL/min (ref >90)
Glucose, Bld: 120 mg/dL — ABNORMAL HIGH (ref 70–99)

## 2012-01-11 MED ORDER — MORPHINE SULFATE ER 15 MG PO TBCR
15.0000 mg | EXTENDED_RELEASE_TABLET | Freq: Two times a day (BID) | ORAL | Status: DC
  Administered 2012-01-11 – 2012-01-12 (×3): 15 mg via ORAL
  Filled 2012-01-11 (×3): qty 1

## 2012-01-11 MED ORDER — PANTOPRAZOLE SODIUM 20 MG PO TBEC
20.0000 mg | DELAYED_RELEASE_TABLET | Freq: Every day | ORAL | Status: DC
  Administered 2012-01-11 – 2012-01-12 (×2): 20 mg via ORAL
  Filled 2012-01-11 (×2): qty 1

## 2012-01-11 MED ORDER — HYDROMORPHONE HCL PF 1 MG/ML IJ SOLN
1.0000 mg | INTRAMUSCULAR | Status: DC | PRN
  Administered 2012-01-11 – 2012-01-12 (×11): 2 mg via INTRAVENOUS
  Filled 2012-01-11 (×11): qty 2

## 2012-01-11 MED ORDER — METOPROLOL SUCCINATE 12.5 MG HALF TABLET
12.5000 mg | ORAL_TABLET | Freq: Every day | ORAL | Status: DC
  Administered 2012-01-11 – 2012-01-12 (×2): 12.5 mg via ORAL
  Filled 2012-01-11 (×2): qty 1

## 2012-01-11 MED ORDER — ENOXAPARIN SODIUM 40 MG/0.4ML ~~LOC~~ SOLN
40.0000 mg | SUBCUTANEOUS | Status: DC
  Administered 2012-01-12: 40 mg via SUBCUTANEOUS
  Filled 2012-01-11: qty 0.4

## 2012-01-11 MED ORDER — HYDROXYUREA 500 MG PO CAPS
1000.0000 mg | ORAL_CAPSULE | Freq: Every day | ORAL | Status: DC
  Administered 2012-01-11 – 2012-01-12 (×2): 1000 mg via ORAL
  Filled 2012-01-11 (×3): qty 2

## 2012-01-11 MED ORDER — POLYETHYLENE GLYCOL 3350 17 G PO PACK
17.0000 g | PACK | Freq: Every day | ORAL | Status: DC
  Administered 2012-01-11 – 2012-01-12 (×2): 17 g via ORAL
  Filled 2012-01-11 (×2): qty 1

## 2012-01-11 NOTE — Progress Notes (Signed)
Subjective: Ms.Rodelo was concerned that her boyfriend is not allowed to visit and feels this is her only support mother no longer a positive involvement since they announced their engagement. Discussed in multi dicsciplanary meeting behavior issues presented in the past  with altercations with significant other decision was made to continue not to allow visitation. She continues to c/o pain in her leg and back.  Rates her pain as  7/10. Feels she is unable to manage her pain at home at this time will start tapering pain medications.  Objective: Vital signs in last 24 hours: Filed Vitals:   01/11/12 0209 01/11/12 0257 01/11/12 0602 01/11/12 1002  BP: 133/98 106/77 122/83 108/69  Pulse: 113 97 113 118  Temp: 98.6 F (37 C)  97.9 F (36.6 C) 98.2 F (36.8 C)  TempSrc: Oral  Oral Oral  Resp: 21  16 18   Height:      Weight:   89.721 kg (197 lb 12.8 oz)   SpO2: 97%  97% 98%   Weight change: 0.499 kg (1 lb 1.6 oz)  Intake/Output Summary (Last 24 hours) at 01/11/12 1225 Last data filed at 01/11/12 0900  Gross per 24 hour  Intake   1077 ml  Output   1050 ml  Net     27 ml    Physical Exam: General Appearance: Alert and oriented, cooperative, well nourished, well developed,no acute distress  Head: Normocephalic, atraumatic  Eyes: PERRLA, EOMI, scleral icterus  Nose:  no visible drainage, no sinus tenderness  Throat: Lips, mucosa, tongue, teeth and gums are normal  Neck: Trachea is midline, no lymphophadenopathy, mass or thyromegaly  Back: Symmetric, no curvature, bilateral CVA tenderness, diffuse tenderness  Resp: Diminished breath sounds bibasilar and bilaterally, CTA, no wheezes/rales/rhonchi  Cardio: Regular rate and rhythm, S1, S2 normal, soft murmur, no click/rub/gallop  GI: Soft, distended, hypoactive bowel sounds, no organomegaly  Extremities: Atraumatic, no cyanosis, Homans sign is negative,  Pulses: 2+ and symmetric  Skin: Skin color, texture and turgor are normal    Neurologic: CN II - XII intact  Psych: appropriate affect   Lab Results:  Basename 01/11/12 0500 01/10/12 1800  NA 137 136  K 3.5 3.6  CL 101 102  CO2 28 26  GLUCOSE 120* 239*  BUN 5* 5*  CREATININE 0.64 0.61  CALCIUM 9.0 8.5  MG -- --  PHOS -- --    Basename 01/11/12 0500 01/10/12 1800  AST 20 22  ALT 12 11  ALKPHOS 63 55  BILITOT 3.8* 3.3*  PROT 7.1 6.7  ALBUMIN 3.9 3.7   No results found for this basename: LIPASE:2,AMYLASE:2 in the last 72 hours  Basename 01/11/12 0500 01/10/12 1800  WBC 17.7* 15.5*  NEUTROABS 11.7* 10.4*  HGB 8.3* 8.2*  HCT 23.2* 23.5*  MCV 90.3 90.4  PLT 460* 488*    Basename 01/08/12 1641  CKTOTAL 55  CKMB --  CKMBINDEX --  TROPONINI --   No components found with this basename: POCBNP:3 No results found for this basename: DDIMER:2 in the last 72 hours No results found for this basename: HGBA1C:2 in the last 72 hours No results found for this basename: CHOL:2,HDL:2,LDLCALC:2,TRIG:2,CHOLHDL:2,LDLDIRECT:2 in the last 72 hours No results found for this basename: TSH,T4TOTAL,FREET3,T3FREE,THYROIDAB in the last 72 hours  Basename 01/08/12 1641  VITAMINB12 --  FOLATE --  FERRITIN --  TIBC --  IRON --  RETICCTPCT 12.9*    Micro Results: No results found for this or any previous visit (from the past 240 hour(s)).  Studies/Results: No results found.  Medications:   Scheduled:    . folic acid  1 mg Oral Daily  . HYDROmorphone  4 mg Oral Once  . lactulose  20 g Oral BID   Scheduled Meds:    . folic acid  1 mg Oral Daily  . HYDROmorphone  4 mg Oral Once  . lactulose  20 g Oral BID   Continuous Infusions:    . dextrose 5 % and 0.45% NaCl 50 mL/hr at 01/10/12 1714   PRN Meds:.diphenhydrAMINE, diphenhydrAMINE, HYDROmorphone (DILAUDID) injection, ondansetron (ZOFRAN) IV, ondansetron, sodium chloride  Assessment/Plan: 1. Sickle Cell Pain Crisis: - with active hemolysis, IVF  and pain, antiemetic and pruritus management.DVT  prophylaxis, taper pain medications   2. Situational stressor : will refer to SW see intro note  3. Acute/Chronic: IV pain management will taper  4. Leucocytosis:reactive v/s inflammatory process monitor for s/s of infection 5. Constipation: continue laxative     LOS: 3 days  EDWARDS, MICHELLE P 01/11/2012, 12:25 PM

## 2012-01-12 LAB — CBC WITH DIFFERENTIAL/PLATELET
Basophils Relative: 1 % (ref 0–1)
Hemoglobin: 7.7 g/dL — ABNORMAL LOW (ref 12.0–15.0)
MCHC: 35.2 g/dL (ref 30.0–36.0)
Monocytes Relative: 13 % — ABNORMAL HIGH (ref 3–12)
Neutro Abs: 8.3 10*3/uL — ABNORMAL HIGH (ref 1.7–7.7)
Neutrophils Relative %: 57 % (ref 43–77)
RBC: 2.3 MIL/uL — ABNORMAL LOW (ref 3.87–5.11)

## 2012-01-12 LAB — COMPREHENSIVE METABOLIC PANEL
ALT: 11 U/L (ref 0–35)
AST: 17 U/L (ref 0–37)
Albumin: 3.4 g/dL — ABNORMAL LOW (ref 3.5–5.2)
Alkaline Phosphatase: 63 U/L (ref 39–117)
BUN: 6 mg/dL (ref 6–23)
Chloride: 102 mEq/L (ref 96–112)
Potassium: 3.7 mEq/L (ref 3.5–5.1)
Sodium: 136 mEq/L (ref 135–145)
Total Bilirubin: 2.5 mg/dL — ABNORMAL HIGH (ref 0.3–1.2)

## 2012-01-12 MED ORDER — MORPHINE SULFATE ER 15 MG PO TBCR: 15.0000 mg | EXTENDED_RELEASE_TABLET | Freq: Two times a day (BID) | ORAL | Status: DC | PRN

## 2012-01-12 MED ORDER — HYDROMORPHONE HCL PF 4 MG/ML IJ SOLN
4.0000 mg | Freq: Once | INTRAMUSCULAR | Status: AC
  Administered 2012-01-12: 4 mg via INTRAVENOUS
  Filled 2012-01-12: qty 1

## 2012-01-12 MED ORDER — OXYCODONE HCL 10 MG PO TABS: 15.0000 mg | ORAL_TABLET | Freq: Four times a day (QID) | ORAL | Status: DC | PRN

## 2012-01-12 NOTE — Discharge Summary (Signed)
[facility] Cell [facility] Discharge Summary  Patient ID: KAMARIYA BLEVENS MRN: [id number] DOB/AGE: 1991-02-02 21 y.o.  Admit date: 01/08/2012 Discharge date: 01/12/2012   Discharge Diagnoses:   Sickle cell crisis with active hemolysis  Chronic back pain Situational stress. History of tobacco abuse.  Discharged Condition: Improved.  Operations/Procedues: none   Hospital Course: see admission H&P for details. Patient was admitted to the hospital from [facility] for  further treatment of acute/chronic pain in the setting of sickle cell. Her pain was located in her lower back and legs, She was associating this with her sickle cell crisis. She was started on IV fluids for hydration. She was given IV Dilaudid for pain control. Her nausea and pruritus was managed as well. She was making gradual improvement thereafter. Patient was continued on IV fluids. IV pain medication was gradually tapered. Patient did demonstrate evidence of active hemolysis with Total bilirubin 2.5 time of discharge. She did not require exchange transfusions.It is felt that this crisis was precipitated by starting class at [facility].  She felt that she could manage her pain at home with oral medications. She was subsequently discharged  Day of discharge: BP 126/89  Pulse 101  Temp 99.1 F (37.3 C) (Oral)  Resp 16  Ht 5\' 3"  (1.6 m)  Wt 89.721 kg (197 lb 12.8 oz)  BMI 35.04 kg/m2  SpO2 97%  LMP 01/05/2012 Physical Exam:  General Appearance: Alert and oriented, cooperative, well nourished, well developed,no acute distress  Resp: Diminished breath sounds bibasilar and bilaterally, CTA, no wheezes/rales/rhonchi  Cardio: Regular rate and rhythm, S1, S2 normal, soft murmur, no click/rub/gallop  GI: Soft, distended, hypoactive bowel sounds, no organomegaly  Extremities: Atraumatic, no cyanosis, Homans sign is negative,  Psych: appropriate affect   Disposition: 04-Intermediate Care Facility   Medication List  As of  01/12/2012  4:48 PM   TAKE these medications         cholecalciferol 1000 UNITS tablet   Commonly known as: VITAMIN D   Take 1,000 Units by mouth daily.      folic acid 1 MG tablet   Commonly known as: FOLVITE   Take 1 mg by mouth daily.      hydroxyurea 500 MG capsule   Commonly known as: HYDREA   Take 1,000 mg by mouth daily. May take with food to minimize GI side effects.      metoprolol succinate 12.5 mg Tb24   Commonly known as: TOPROL-XL   Take 12.5 mg by mouth daily.      morphine 15 MG 12 hr tablet   Commonly known as: MS CONTIN   Take 1 tablet (15 mg total) by mouth 2 (two) times daily as needed. For severe pain      Oxycodone HCl 10 MG Tabs   Take 1.5 tablets (15 mg total) by mouth 4 (four) times daily as needed. For pain.      pantoprazole 20 MG tablet   Commonly known as: PROTONIX   Take 20 mg by mouth daily.          time spent on discharge planning greater than 35 minutes   Signed: Baldemar Dady P 01/12/2012, 4:48 PM

## 2012-01-12 NOTE — Progress Notes (Signed)
Patient's PICC line removed. Laid flat for 20 mins, vital signs taken at 20 minutes-stable. Given discharge information and had been given prescriptions earlier. Questions answered. Taken by wheelchair to personal vehicle.

## 2012-01-19 NOTE — H&P (Signed)
[name] is an 21 y.o. female.   Chief Complaint: persistent sickle cell crisis.   HPI: one of several [facility] systems admission for this 21 year old female with sickle cell anemia who is [facility]. She relates her pain is still not manageable with her present level with oral medication. She is also been having significant emotional distress as well. Patient denies chest pains, palpitations or shortness of breath. Long-standing history of anxiety as noted.  Past Medical History  Diagnosis Date  . H/O: 1 miscarriage 03/22/2011  . 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 Depression 01/06/2011  . GERD (gastroesophageal reflux disease) 02/17/2011  . Trichotillomania     h/o  . Blood transfusion     "lots"  . Sickle cell anemia with crisis   . Exertional dyspnea     "sometimes"  . Sickle cell anemia   . Headache   . Migraines 11/08/11    "@ least twice/month"  . Chronic back pain     "very severe; have knot in my back; from tight muscle; take RX and exercise for it"  . Mood swings 11/08/11    "I go back and forth; real bad"    Past Surgical History  Procedure Date  . Cholecystectomy 05/2010  . Dilation and curettage of uterus 02/20/11    S/P miscarriage    History reviewed. No pertinent family history. Social History:  reports that she has never smoked. She has never used smokeless tobacco. She reports that she uses illicit drugs (Marijuana and Other-see comments). She reports that she does not drink alcohol.  Allergies:  Allergies  Allergen Reactions  . Latex Rash  . Toradol (Ketorolac Tromethamine) Hives and Itching    No prescriptions prior to admission    No results found for this or any previous visit (from the past 48 hour(s)). No results found.  As noted above.  Blood pressure 100/66, pulse 89, temperature 98 F (36.7 C), temperature source Oral, resp. rate 18, height 5\' 3"  (1.6 m), weight 197 lb 12.8  oz (89.721 kg), last menstrual period 01/05/2012, SpO2 100.00%. Well-developed well-nourished overweight black female in moderate distress. HEENT: No sinus tenderness. No sclera icterus. NECK: No enlarged thyroid. No posterior cervical nodes. LUNGS: Clear to auscultation without wheezes. No vocal fremitus. CV: Normal S1, S2 without S3. ABDOMEN: No masses or tenderness. MSK: Negative Homans no edema. NEURO: Intact. PSYCHIATRIC: As noted. Depressed mood with associated anxiety.     Assessment/Plan Sickle cell crisis. Acute on chronic pain. Anxiety disorder with associated depression and adjustment reaction. Gastroesophageal reflux disease. History of migraine headaches. Mild obesity.  PLAN: Continue IV fluids, IV analgesia, antihistamine and anti-emetics. Followup with clinical social worker as well. Incentive spirometry. Followup CBC, CMET in a.m.  Ayleen Mckinstry 01/09/2012, 10:00 a.m.

## 2012-01-28 IMAGING — CR DG HIP COMPLETE 2+V*R*
3 series · 3 of 3 positions shown · non-contrast
Comparison: None

CLINICAL DATA: Sickle cell anemia pain, no injury

RIGHT HIP - COMPLETE 2+ VIEW

[t pelvis a.p.]
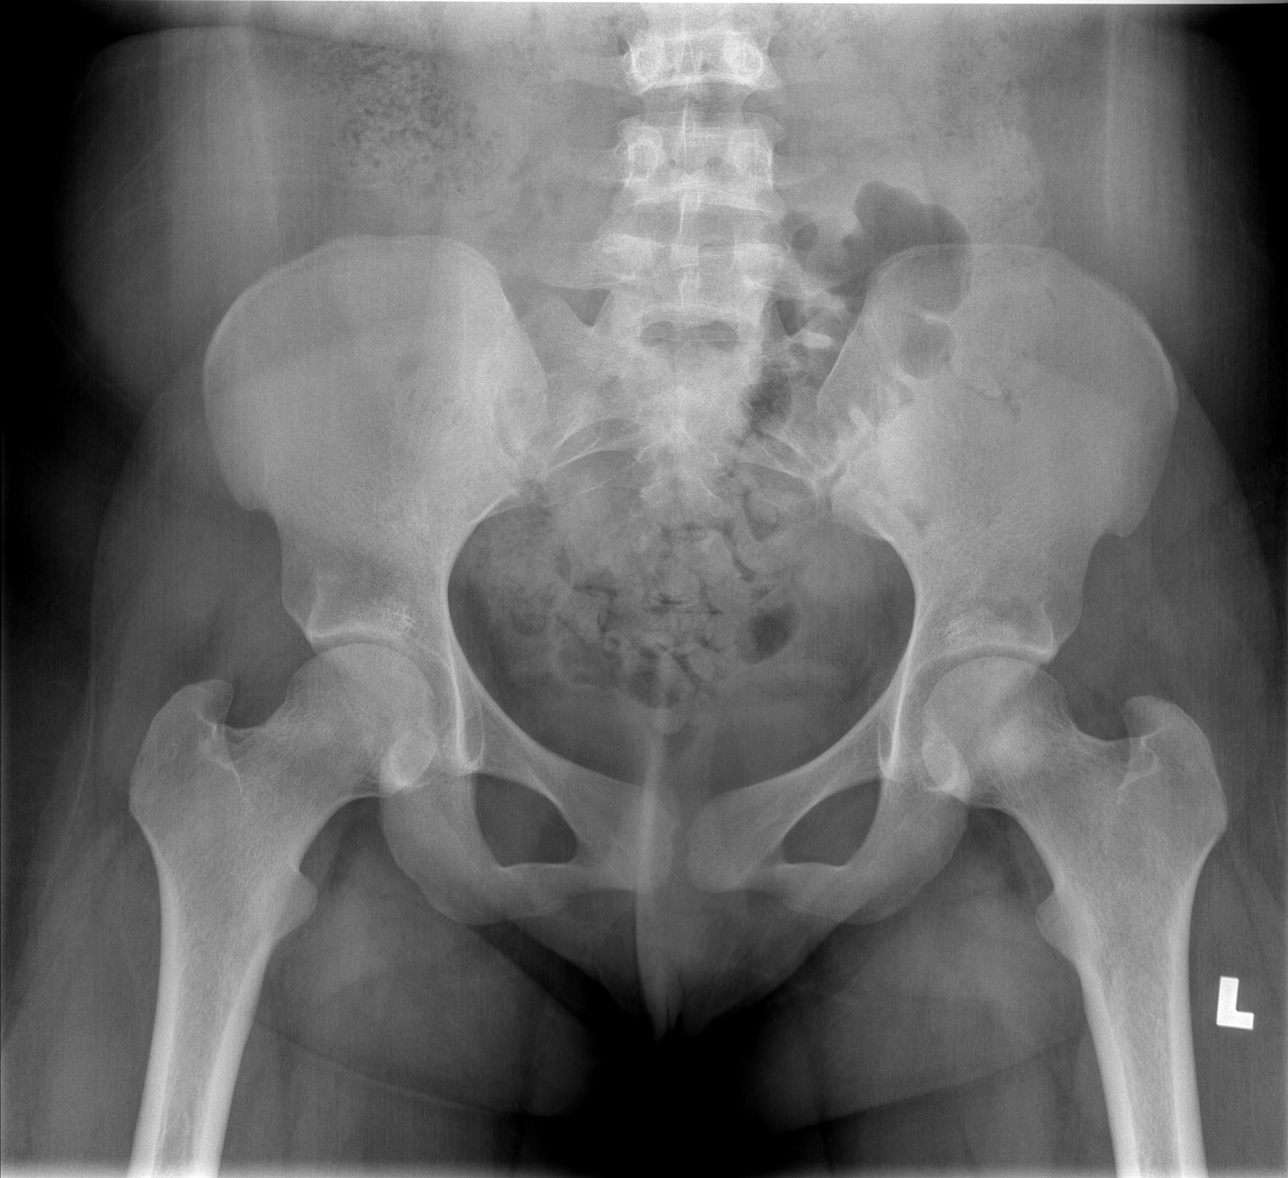

[t hip ap right]
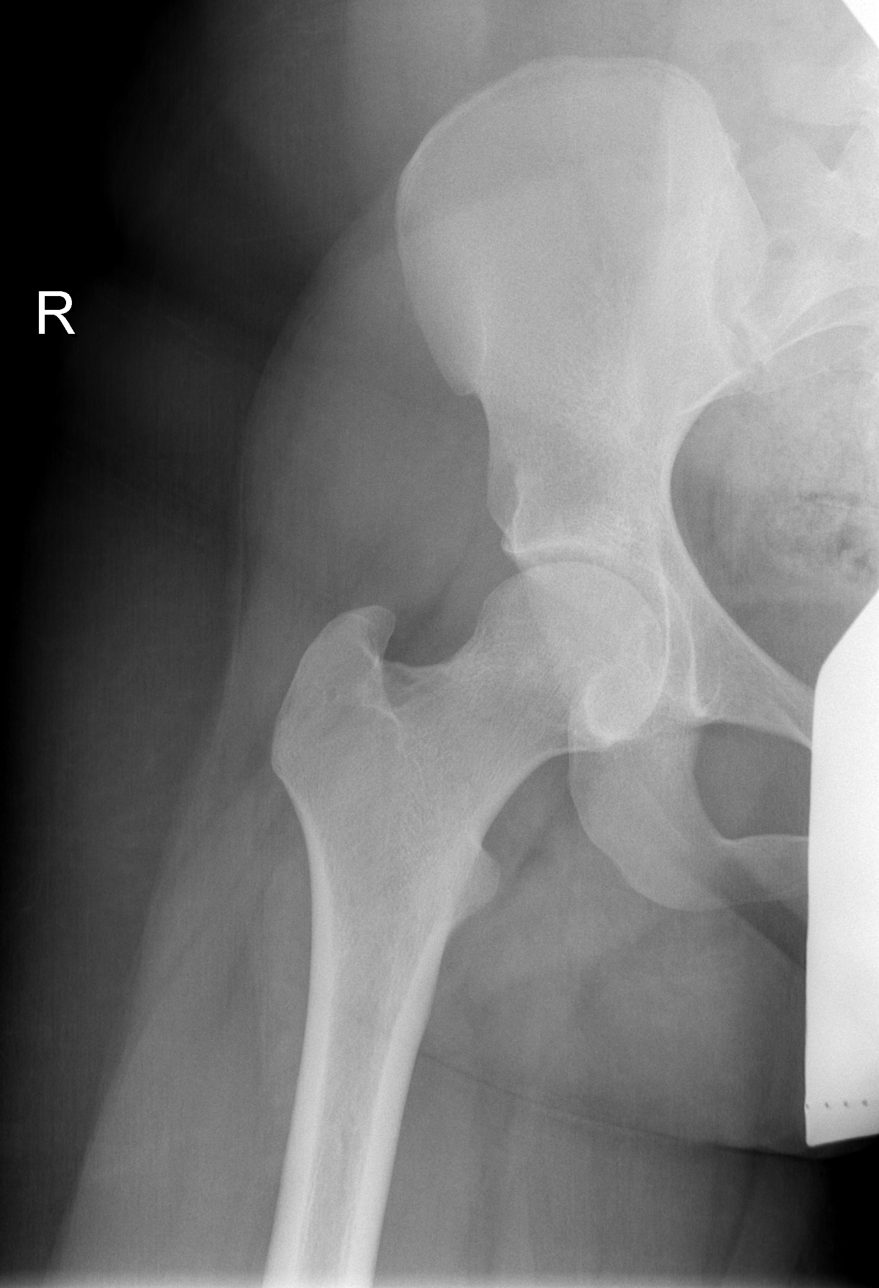

[t hip frog leg right]
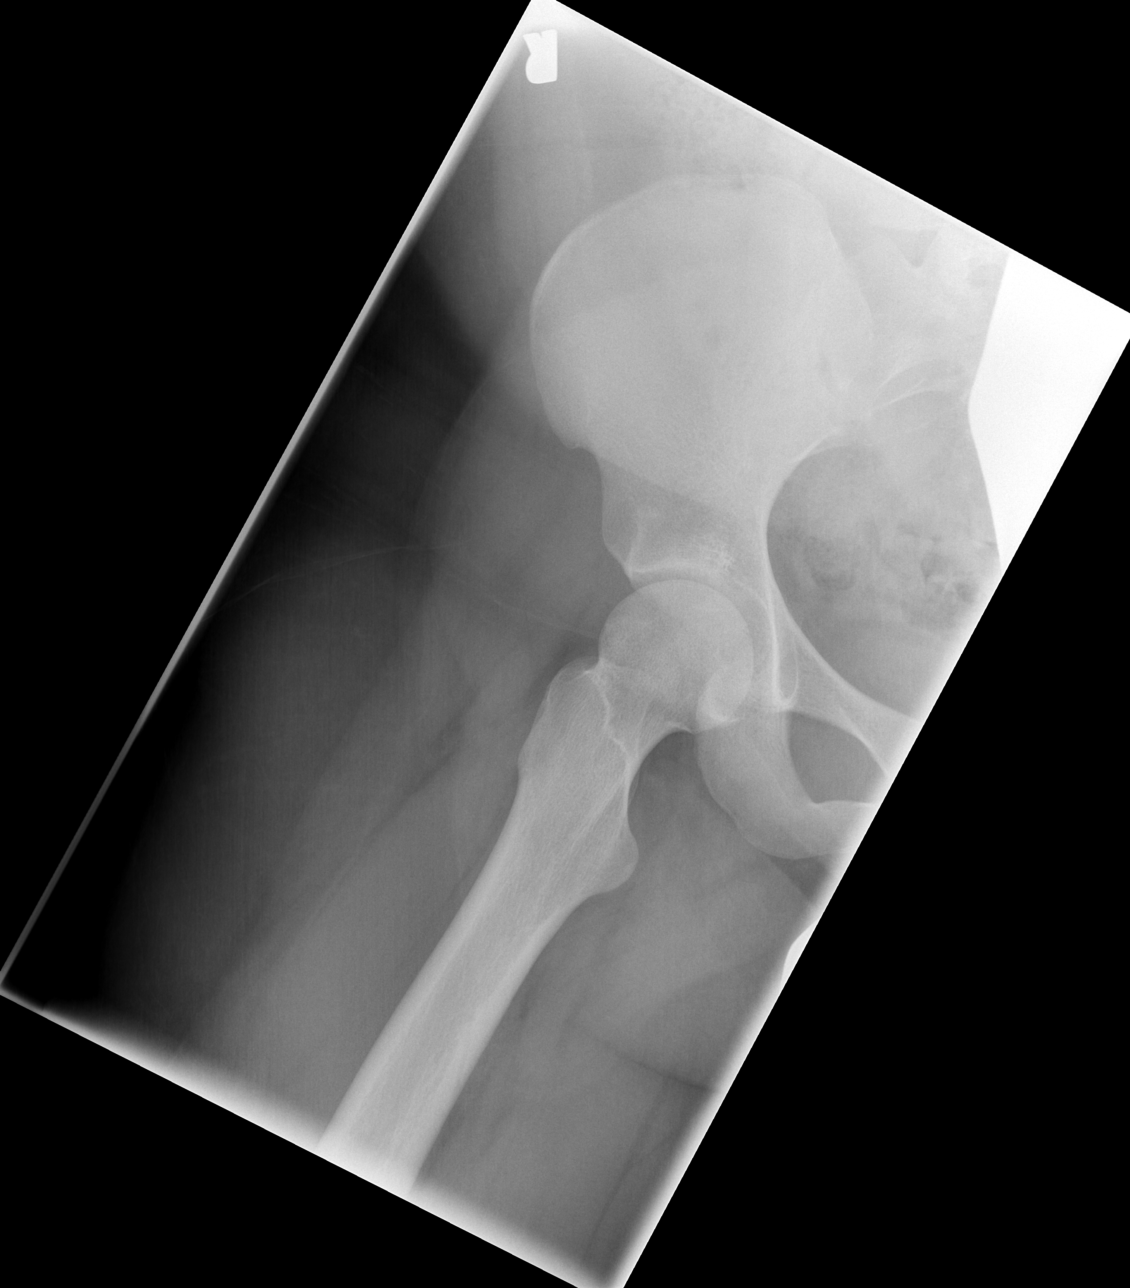

[3 of 3 positions shown; findings below may reference images not displayed]

FINDINGS: Both hips are in normal position.  No acute abnormality
is seen.  There is a sclerotic focus within the left femoral head
and neck which may represent an area of bone infarct in this
patient with sickle cell disease.  The pelvic rami are intact.  The
SI joints appear normal.
IMPRESSION: No acute abnormality.  Sclerotic focus in the left femoral head and
neck may be due to an infarct from sickle cell disease.

## 2012-01-28 IMAGING — CR DG KNEE COMPLETE 4+V*R*
4 series · 4 of 4 positions shown · non-contrast
Comparison: None.

CLINICAL DATA: Sickle cell pain crisis, right knee pain

RIGHT KNEE - COMPLETE 4+ VIEW

[t knee ap right]
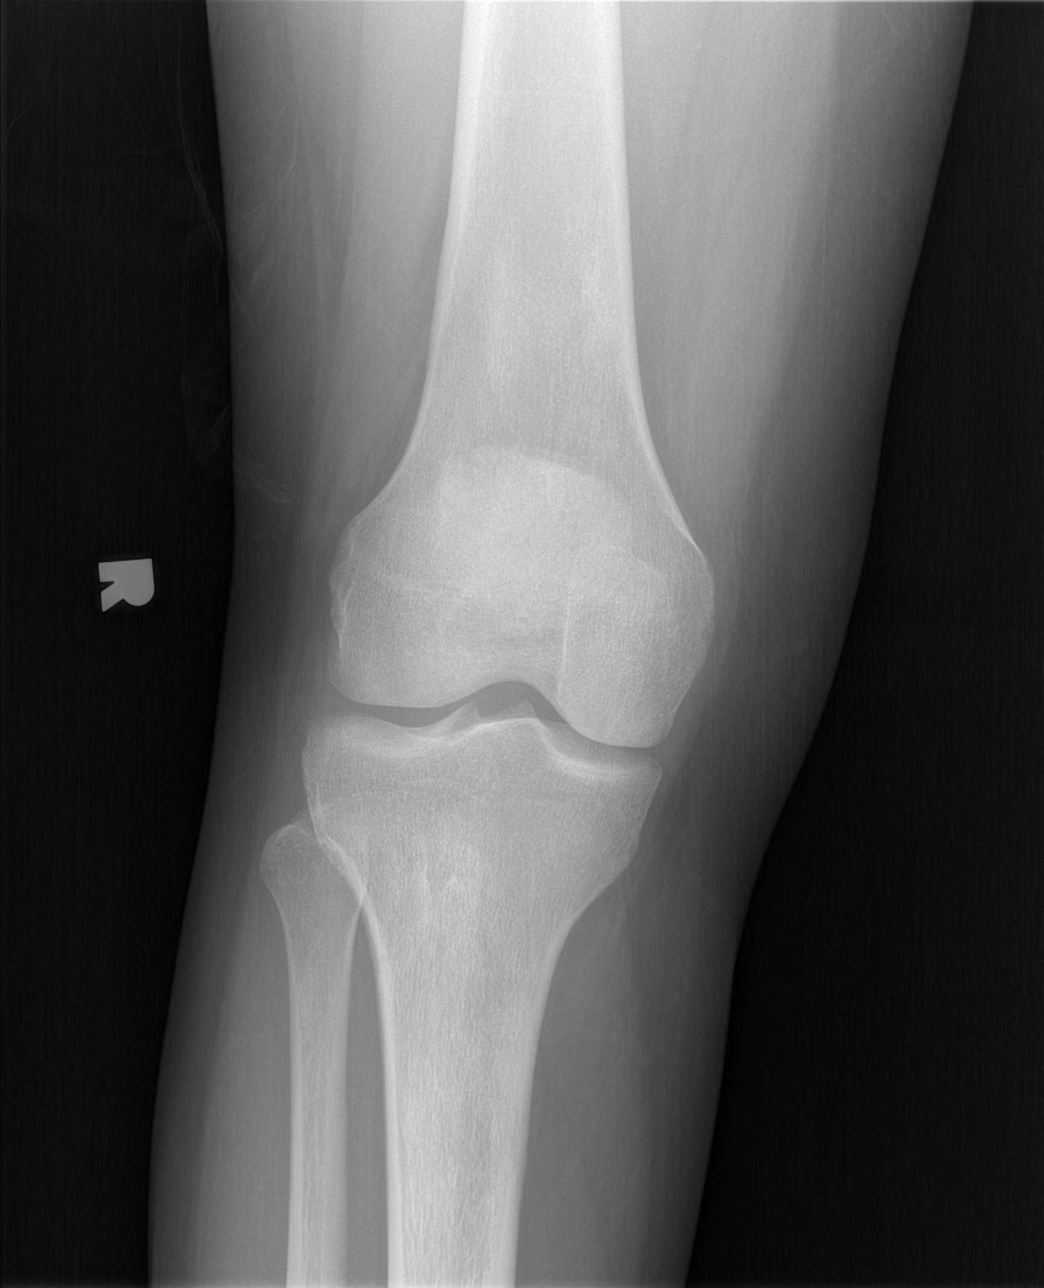

[t knee oblique right (1 of 2)]
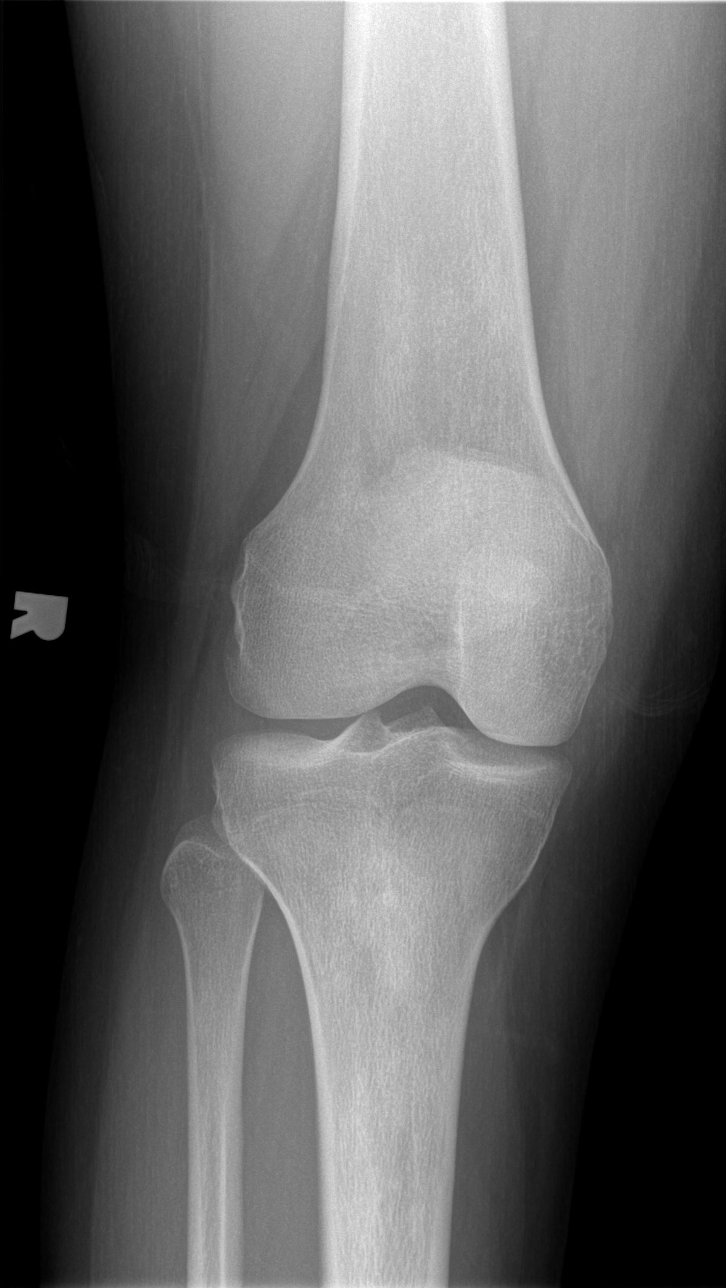

[t knee oblique right (2 of 2)]
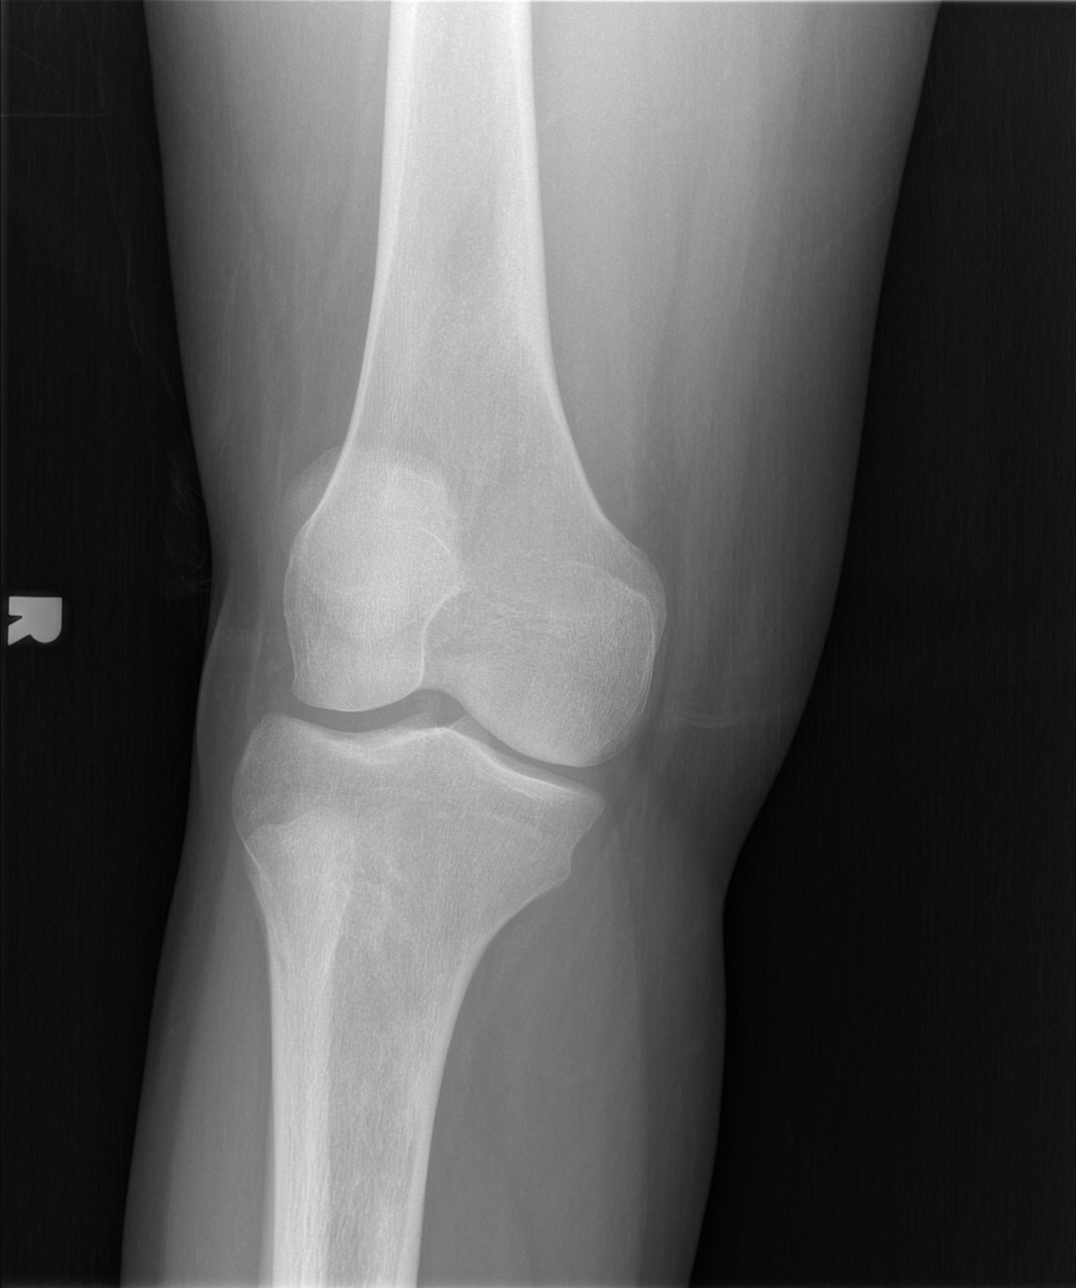

[t knee lat right]
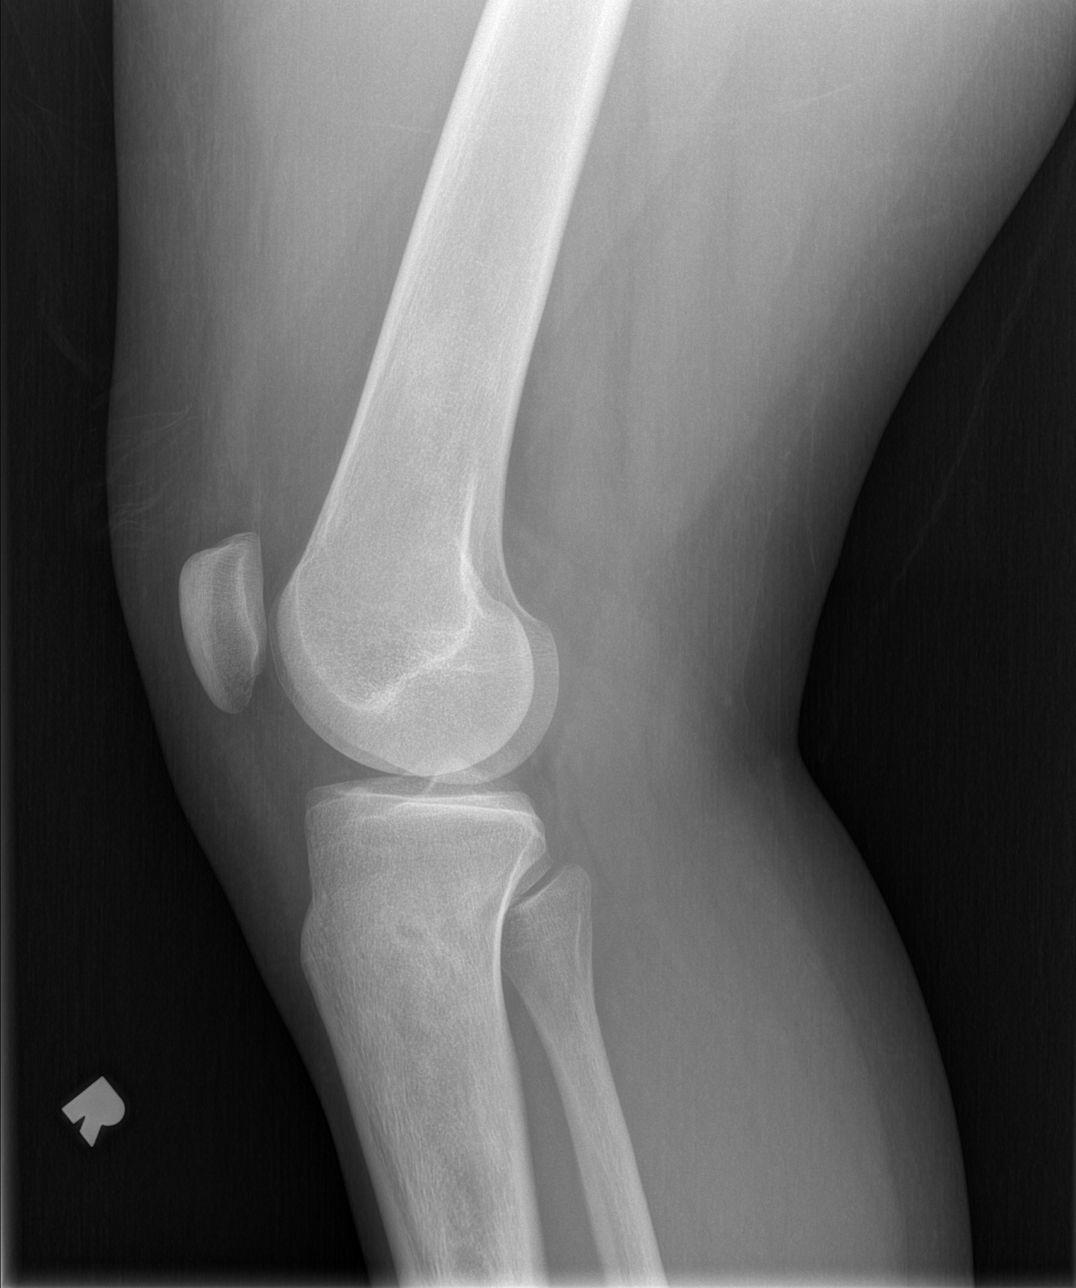

[4 of 4 positions shown; findings below may reference images not displayed]

FINDINGS: No acute bony abnormality is seen.  Knee joint spaces
appear appear normal.  No joint effusion is seen.  There is
inhomogeneity to the proximal right tibia which may represent an
area of bone infarction.
IMPRESSION: No acute abnormality.  Cannot exclude a subtle area of bone
infarction in the proximal right tibia.

## 2012-02-01 IMAGING — CR DG CHEST 2V
2 series · 2 of 2 positions shown · non-contrast
Comparison: 12/07/2010

CLINICAL DATA: Chest pain.  Sickle cell disease.

CHEST - 2 VIEW

[w chest pa]
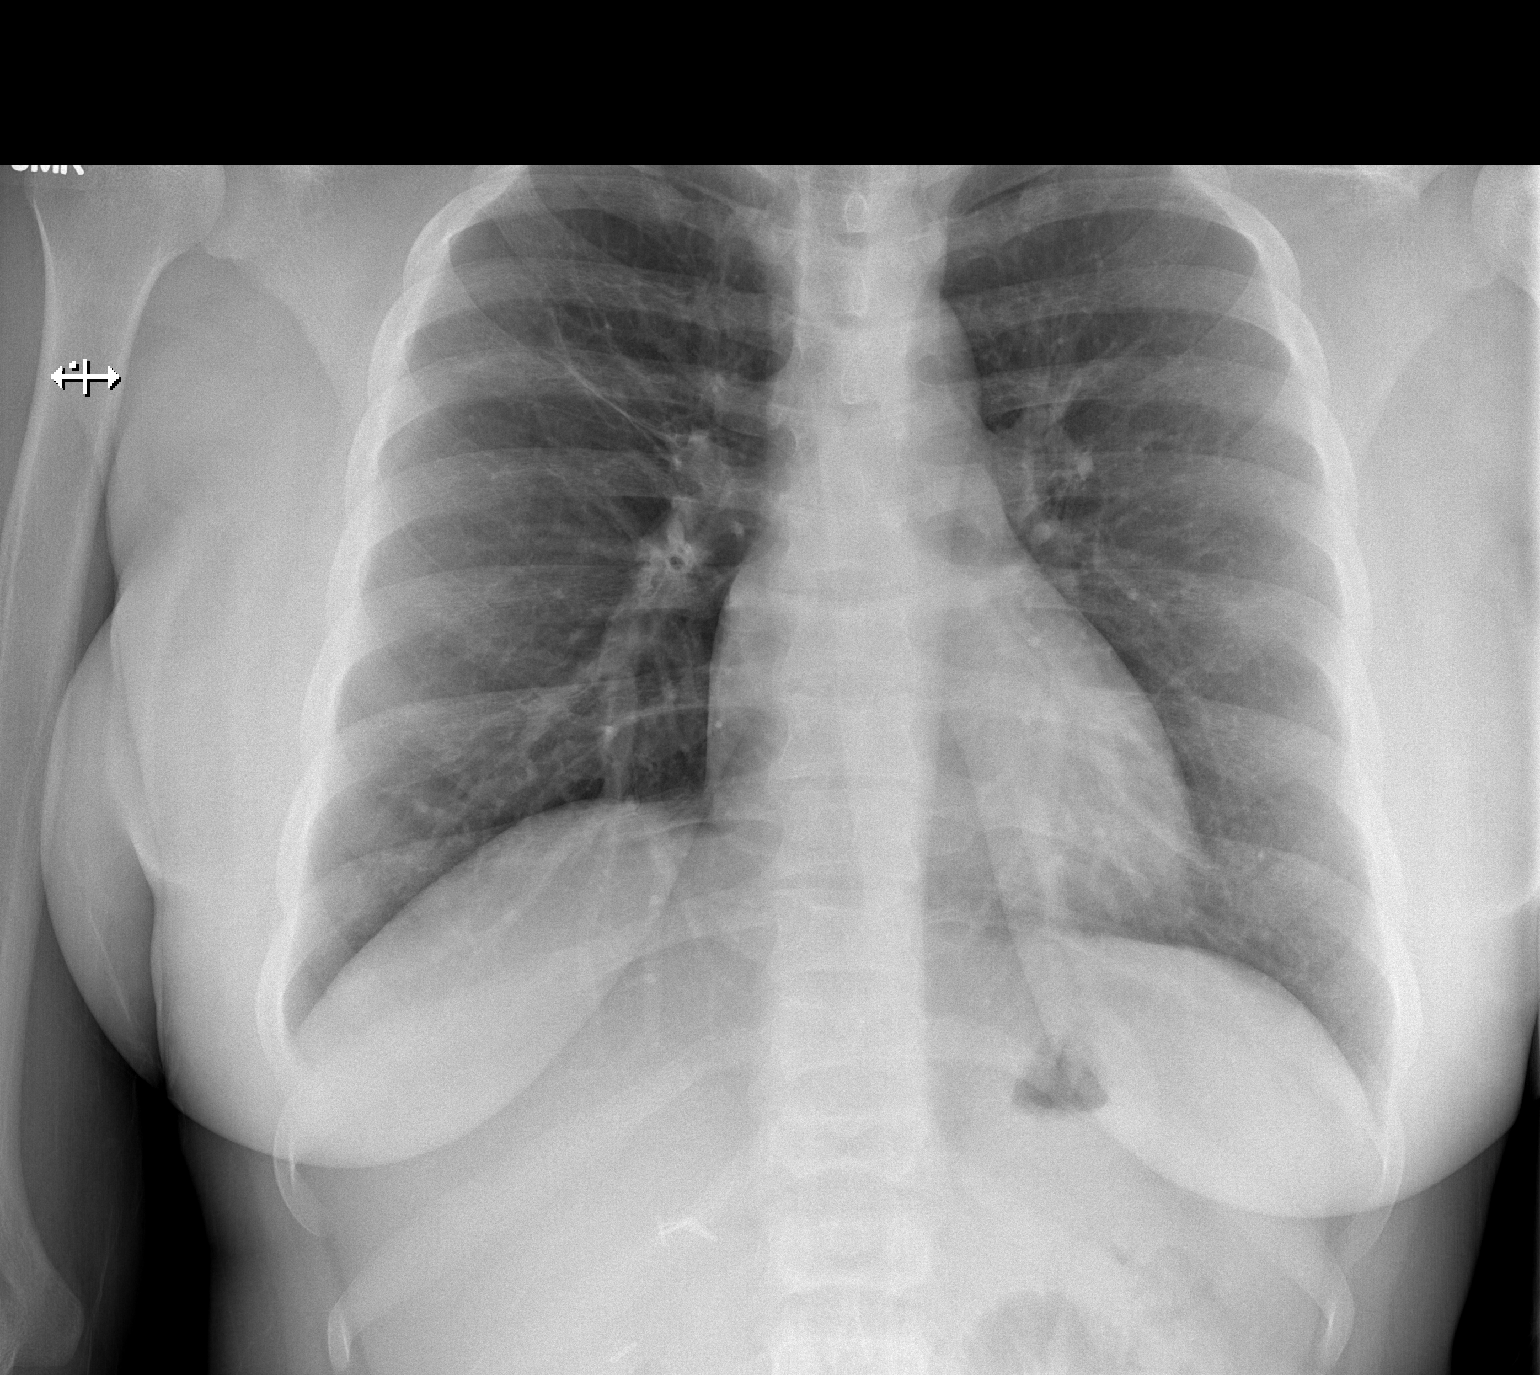

[w chest lat]
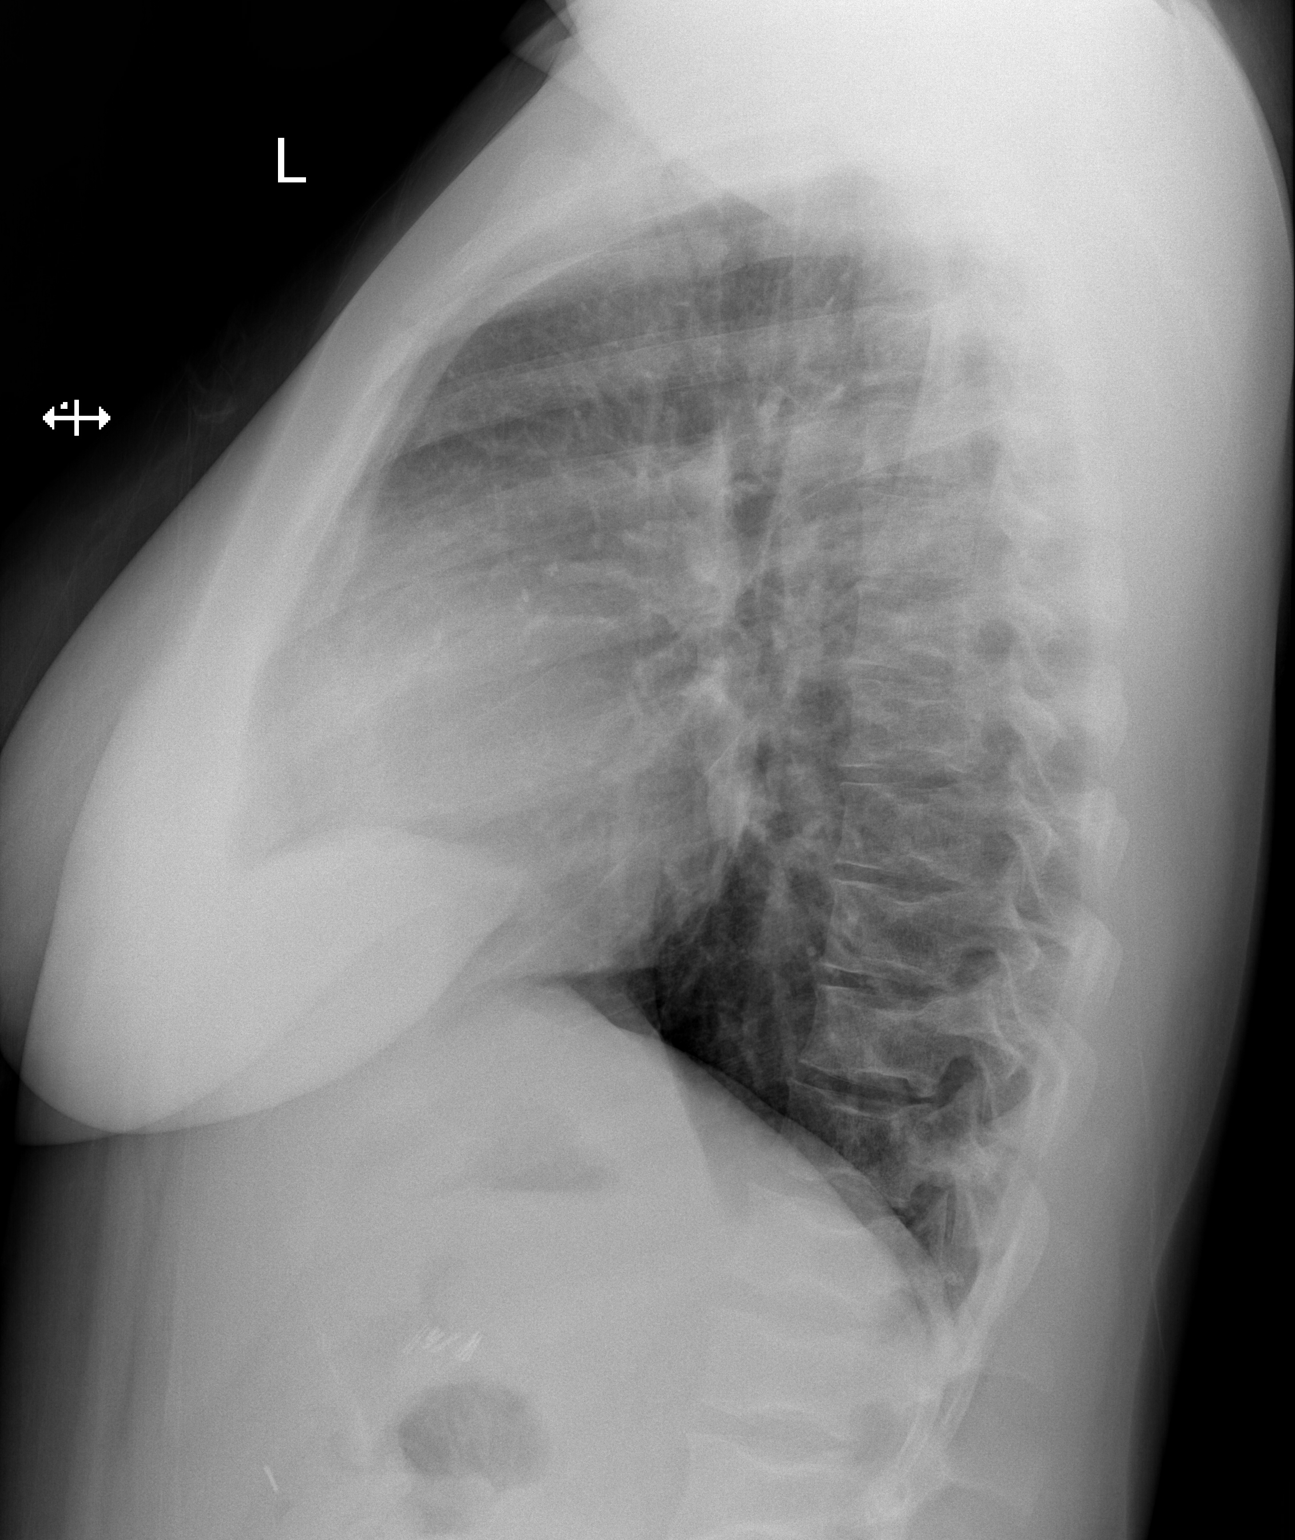

[2 of 2 positions shown; findings below may reference images not displayed]

FINDINGS: The lungs are clear without focal infiltrate, edema,
pneumothorax or pleural effusion.  Linear scarring in the right
upper lobe is stable. The cardiopericardial silhouette is within
normal limits for size.  Changes in the thoracolumbar spine are
compatible with the reported history of sickle cell disease.
IMPRESSION: No acute cardiopulmonary process.

## 2012-02-11 IMAGING — CR DG CHEST 2V
2 series · 2 of 2 positions shown · non-contrast
Comparison: Chest radiograph performed 03/19/2011

CLINICAL DATA: Chest pain; sickle cell crisis.

CHEST - 2 VIEW

[w chest pa]
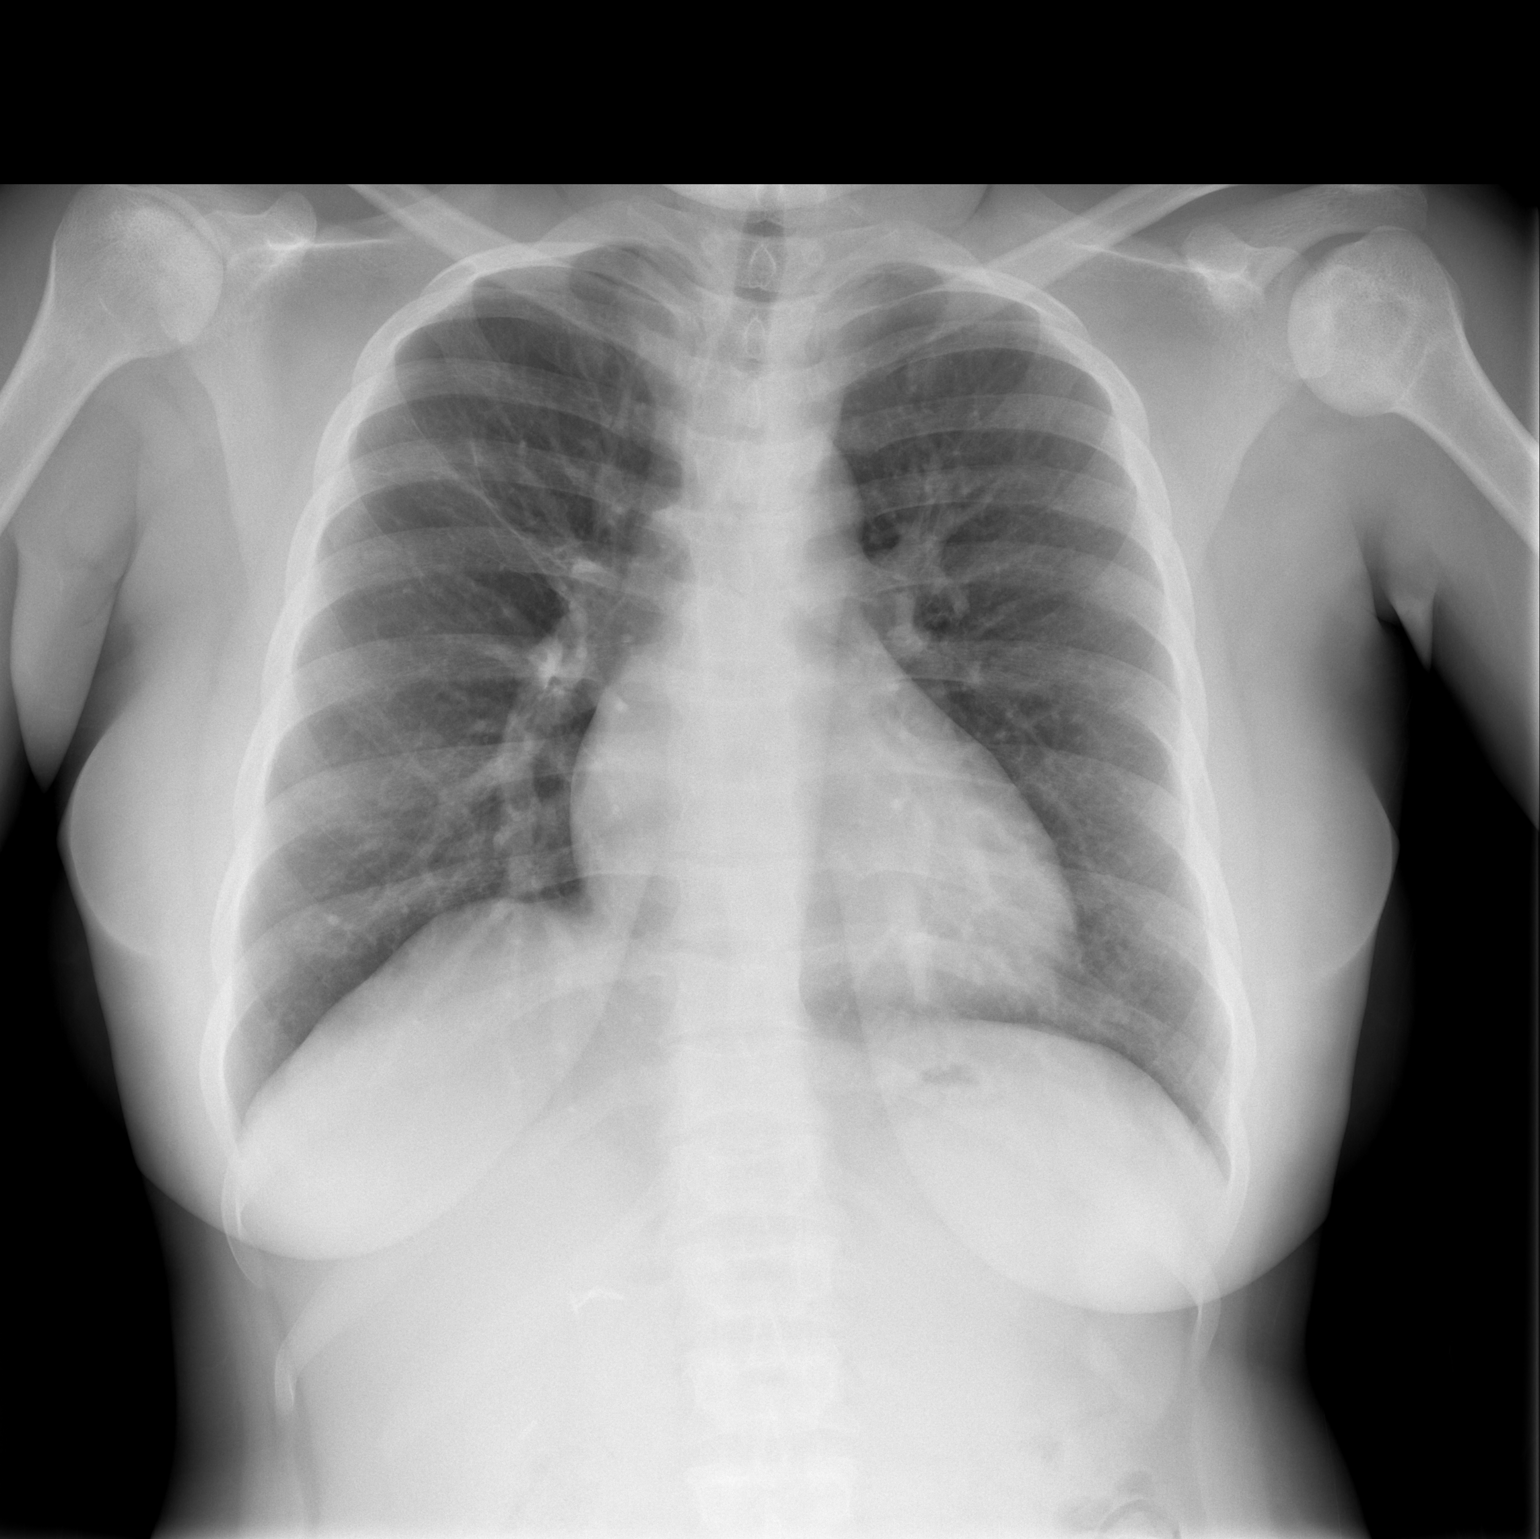

[w chest lat]
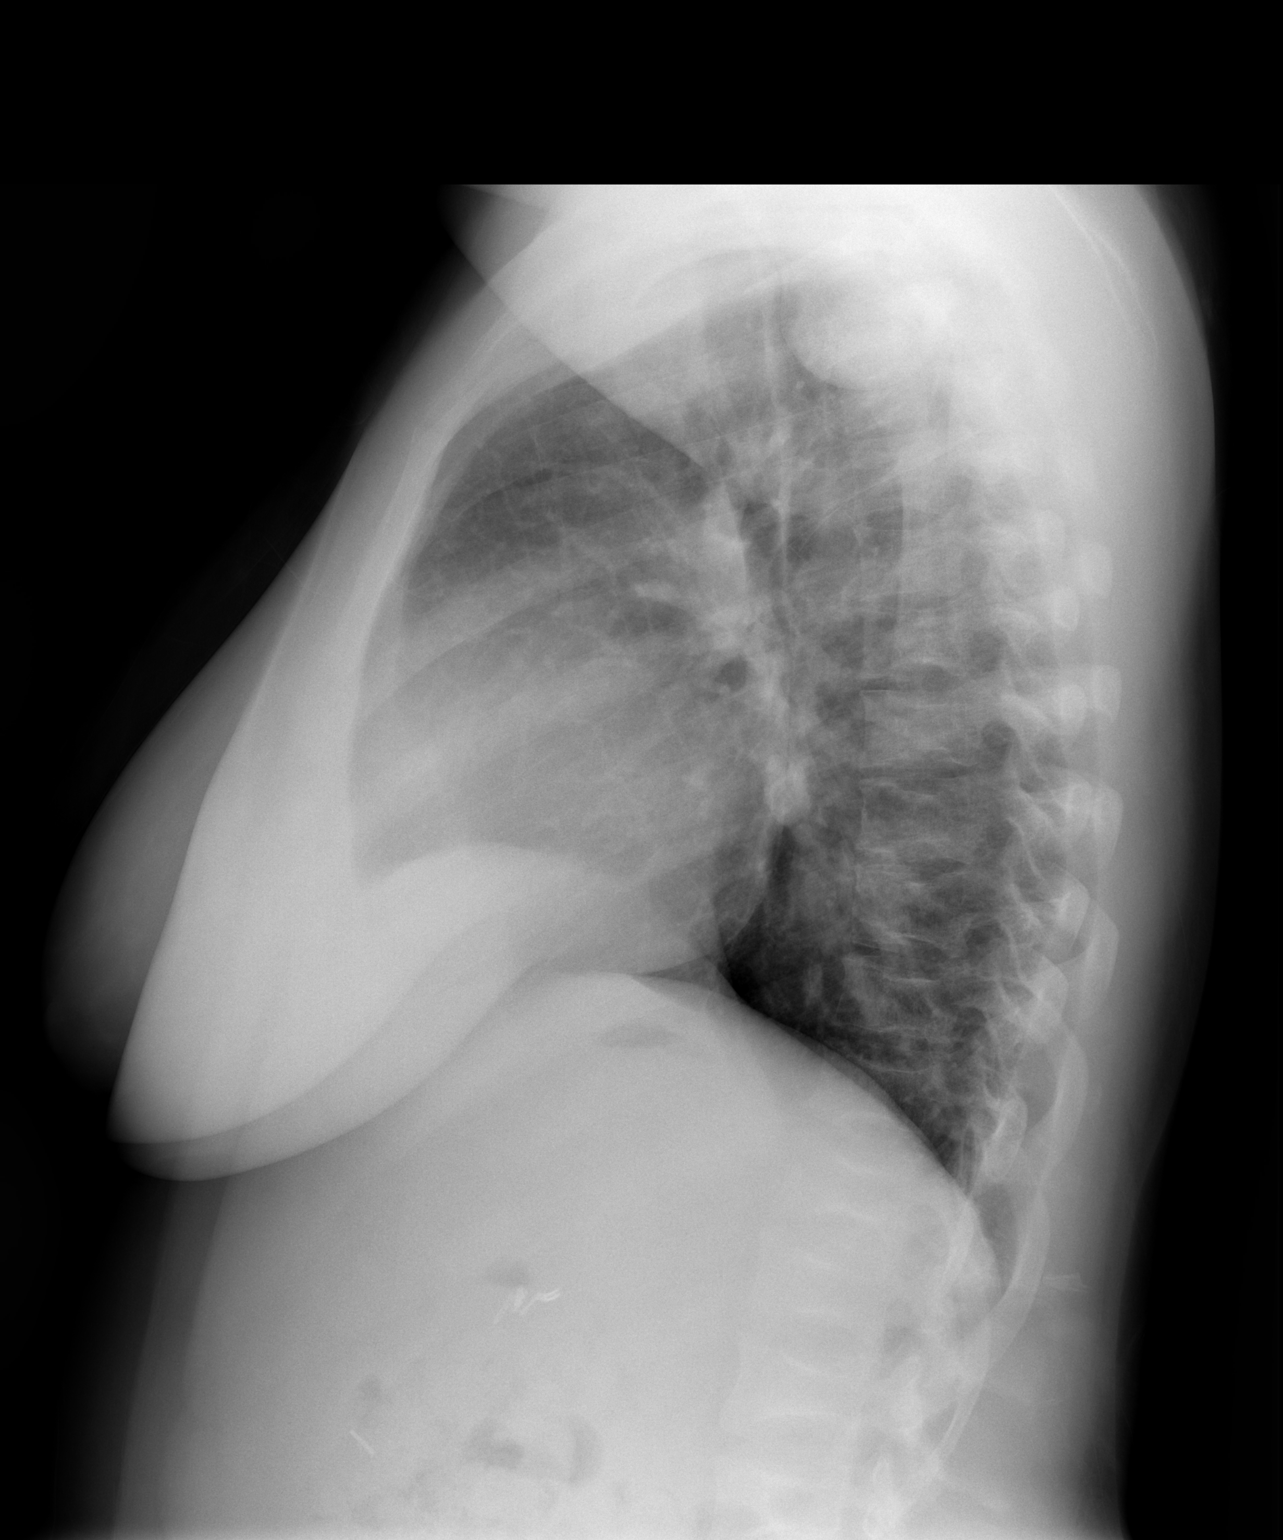

[2 of 2 positions shown; findings below may reference images not displayed]

FINDINGS: The lungs are well-aerated.  Minimal scarring is noted at
the right upper lung zone.  There is no evidence of focal
opacification, pleural effusion or pneumothorax.

The heart is normal in size; the mediastinal contour is within
normal limits.  No acute osseous abnormalities are seen.  Mild
sclerotic change is noted at the humeral heads bilaterally.  Clips
are noted within the right upper quadrant, reflecting prior
cholecystectomy.
IMPRESSION: No acute cardiopulmonary process seen.

## 2012-02-12 ENCOUNTER — Encounter (HOSPITAL_COMMUNITY): Payer: Self-pay | Admitting: *Deleted

## 2012-02-12 ENCOUNTER — Inpatient Hospital Stay (HOSPITAL_COMMUNITY)
Admission: EM | Admit: 2012-02-12 | Discharge: 2012-02-16 | DRG: 812 | Disposition: A | Discharge status: Home / Self Care | Payer: Medicaid Other | Attending: Internal Medicine | Admitting: Internal Medicine

## 2012-02-12 DIAGNOSIS — G8929 Other chronic pain: Secondary | ICD-10-CM | POA: Diagnosis present

## 2012-02-12 DIAGNOSIS — F39 Unspecified mood [affective] disorder: Secondary | ICD-10-CM | POA: Diagnosis present

## 2012-02-12 DIAGNOSIS — F17200 Nicotine dependence, unspecified, uncomplicated: Secondary | ICD-10-CM | POA: Diagnosis present

## 2012-02-12 DIAGNOSIS — Z6835 Body mass index (BMI) 35.0-35.9, adult: Secondary | ICD-10-CM

## 2012-02-12 DIAGNOSIS — Z23 Encounter for immunization: Secondary | ICD-10-CM

## 2012-02-12 DIAGNOSIS — R51 Headache: Secondary | ICD-10-CM

## 2012-02-12 DIAGNOSIS — D571 Sickle-cell disease without crisis: Secondary | ICD-10-CM

## 2012-02-12 DIAGNOSIS — J309 Allergic rhinitis, unspecified: Secondary | ICD-10-CM | POA: Diagnosis present

## 2012-02-12 DIAGNOSIS — M549 Dorsalgia, unspecified: Secondary | ICD-10-CM

## 2012-02-12 DIAGNOSIS — Z91040 Latex allergy status: Secondary | ICD-10-CM

## 2012-02-12 DIAGNOSIS — Z886 Allergy status to analgesic agent status: Secondary | ICD-10-CM

## 2012-02-12 DIAGNOSIS — G43909 Migraine, unspecified, not intractable, without status migrainosus: Secondary | ICD-10-CM | POA: Diagnosis present

## 2012-02-12 DIAGNOSIS — J329 Chronic sinusitis, unspecified: Secondary | ICD-10-CM | POA: Diagnosis present

## 2012-02-12 DIAGNOSIS — K219 Gastro-esophageal reflux disease without esophagitis: Secondary | ICD-10-CM | POA: Diagnosis present

## 2012-02-12 DIAGNOSIS — F411 Generalized anxiety disorder: Secondary | ICD-10-CM | POA: Diagnosis present

## 2012-02-12 DIAGNOSIS — D5700 Hb-SS disease with crisis, unspecified: Principal | ICD-10-CM | POA: Diagnosis present

## 2012-02-12 DIAGNOSIS — Z9089 Acquired absence of other organs: Secondary | ICD-10-CM

## 2012-02-12 DIAGNOSIS — R110 Nausea: Secondary | ICD-10-CM

## 2012-02-12 DIAGNOSIS — E669 Obesity, unspecified: Secondary | ICD-10-CM | POA: Diagnosis present

## 2012-02-12 DIAGNOSIS — R519 Headache, unspecified: Secondary | ICD-10-CM | POA: Diagnosis present

## 2012-02-12 DIAGNOSIS — F439 Reaction to severe stress, unspecified: Secondary | ICD-10-CM

## 2012-02-12 DIAGNOSIS — R112 Nausea with vomiting, unspecified: Secondary | ICD-10-CM | POA: Diagnosis present

## 2012-02-12 DIAGNOSIS — F6389 Other impulse disorders: Secondary | ICD-10-CM | POA: Diagnosis present

## 2012-02-12 DIAGNOSIS — Z79899 Other long term (current) drug therapy: Secondary | ICD-10-CM

## 2012-02-12 LAB — CBC WITH DIFFERENTIAL/PLATELET
Basophils Absolute: 0.1 10*3/uL (ref 0.0–0.1)
Eosinophils Relative: 3 % (ref 0–5)
HCT: 26.7 % — ABNORMAL LOW (ref 36.0–46.0)
Hemoglobin: 9.5 g/dL — ABNORMAL LOW (ref 12.0–15.0)
Lymphocytes Relative: 23 % (ref 12–46)
Lymphs Abs: 2.8 10*3/uL (ref 0.7–4.0)
MCV: 90.8 fL (ref 78.0–100.0)
Monocytes Absolute: 1.3 10*3/uL — ABNORMAL HIGH (ref 0.1–1.0)
Monocytes Relative: 11 % (ref 3–12)
Neutro Abs: 7.3 10*3/uL (ref 1.7–7.7)
RBC: 2.94 MIL/uL — ABNORMAL LOW (ref 3.87–5.11)
WBC: 11.8 10*3/uL — ABNORMAL HIGH (ref 4.0–10.5)

## 2012-02-12 LAB — COMPREHENSIVE METABOLIC PANEL
AST: 22 U/L (ref 0–37)
BUN: 5 mg/dL — ABNORMAL LOW (ref 6–23)
CO2: 26 mEq/L (ref 19–32)
Calcium: 9.3 mg/dL (ref 8.4–10.5)
Chloride: 103 mEq/L (ref 96–112)
Creatinine, Ser: 0.59 mg/dL (ref 0.50–1.10)
GFR calc Af Amer: >90 mL/min (ref >90)
GFR calc non Af Amer: >90 mL/min (ref >90)
Glucose, Bld: 95 mg/dL (ref 70–99)
Total Bilirubin: 1.7 mg/dL — ABNORMAL HIGH (ref 0.3–1.2)

## 2012-02-12 LAB — URINALYSIS, ROUTINE W REFLEX MICROSCOPIC
Hgb urine dipstick: NEGATIVE
Nitrite: NEGATIVE
Protein, ur: NEGATIVE mg/dL
Urobilinogen, UA: 1 mg/dL (ref 0.0–1.0)

## 2012-02-12 LAB — POCT PREGNANCY, URINE: Preg Test, Ur: NEGATIVE

## 2012-02-12 MED ORDER — HYDROMORPHONE HCL PF 2 MG/ML IJ SOLN
2.0000 mg | Freq: Once | INTRAMUSCULAR | Status: AC
  Administered 2012-02-13 (×2): 2 mg via INTRAVENOUS
  Filled 2012-02-12: qty 1

## 2012-02-12 MED ORDER — ONDANSETRON 4 MG PO TBDP
8.0000 mg | ORAL_TABLET | Freq: Once | ORAL | Status: AC
  Administered 2012-02-12: 8 mg via ORAL

## 2012-02-12 MED ORDER — HYDROMORPHONE HCL PF 1 MG/ML IJ SOLN
1.0000 mg | Freq: Once | INTRAMUSCULAR | Status: AC
  Administered 2012-02-12: 1 mg via SUBCUTANEOUS
  Filled 2012-02-12: qty 1

## 2012-02-12 MED ORDER — SODIUM CHLORIDE 0.9 % IV BOLUS (SEPSIS)
1000.0000 mL | Freq: Once | INTRAVENOUS | Status: AC
  Administered 2012-02-13: 1000 mL via INTRAVENOUS

## 2012-02-12 MED ORDER — ONDANSETRON 4 MG PO TBDP
ORAL_TABLET | ORAL | Status: AC
  Filled 2012-02-12: qty 2

## 2012-02-12 NOTE — ED Notes (Signed)
Pt states that she has been having sickle cell pain sine yesterday. Pt states that it hurts in her head and her teeth. Pt states migraine like HA. Pt states hurts to close mouth. Pt states last crisis was 2 months ago.

## 2012-02-13 ENCOUNTER — Encounter (HOSPITAL_COMMUNITY): Payer: Self-pay | Admitting: Internal Medicine

## 2012-02-13 ENCOUNTER — Observation Stay (HOSPITAL_COMMUNITY): Payer: Medicaid Other

## 2012-02-13 DIAGNOSIS — R51 Headache: Secondary | ICD-10-CM | POA: Diagnosis present

## 2012-02-13 DIAGNOSIS — R519 Headache, unspecified: Secondary | ICD-10-CM | POA: Diagnosis present

## 2012-02-13 DIAGNOSIS — D5700 Hb-SS disease with crisis, unspecified: Principal | ICD-10-CM

## 2012-02-13 DIAGNOSIS — R110 Nausea: Secondary | ICD-10-CM

## 2012-02-13 DIAGNOSIS — R112 Nausea with vomiting, unspecified: Secondary | ICD-10-CM | POA: Diagnosis present

## 2012-02-13 LAB — CBC
HCT: 23.3 % — ABNORMAL LOW (ref 36.0–46.0)
MCV: 91.4 fL (ref 78.0–100.0)
Platelets: 390 10*3/uL (ref 150–400)
RBC: 2.55 MIL/uL — ABNORMAL LOW (ref 3.87–5.11)
WBC: 15.7 10*3/uL — ABNORMAL HIGH (ref 4.0–10.5)

## 2012-02-13 LAB — CREATININE, SERUM
GFR calc Af Amer: >90 mL/min (ref >90)
GFR calc non Af Amer: >90 mL/min (ref >90)

## 2012-02-13 IMAGING — CR DG CHEST 2V
2 series · 2 of 2 positions shown · non-contrast
Comparison: Chest radiograph 03/31/2011 and 05/16/2010

CLINICAL DATA: Evaluate for acute chest syndrome.  Sickle cell
disease.

CHEST - 2 VIEW

[w chest pa]
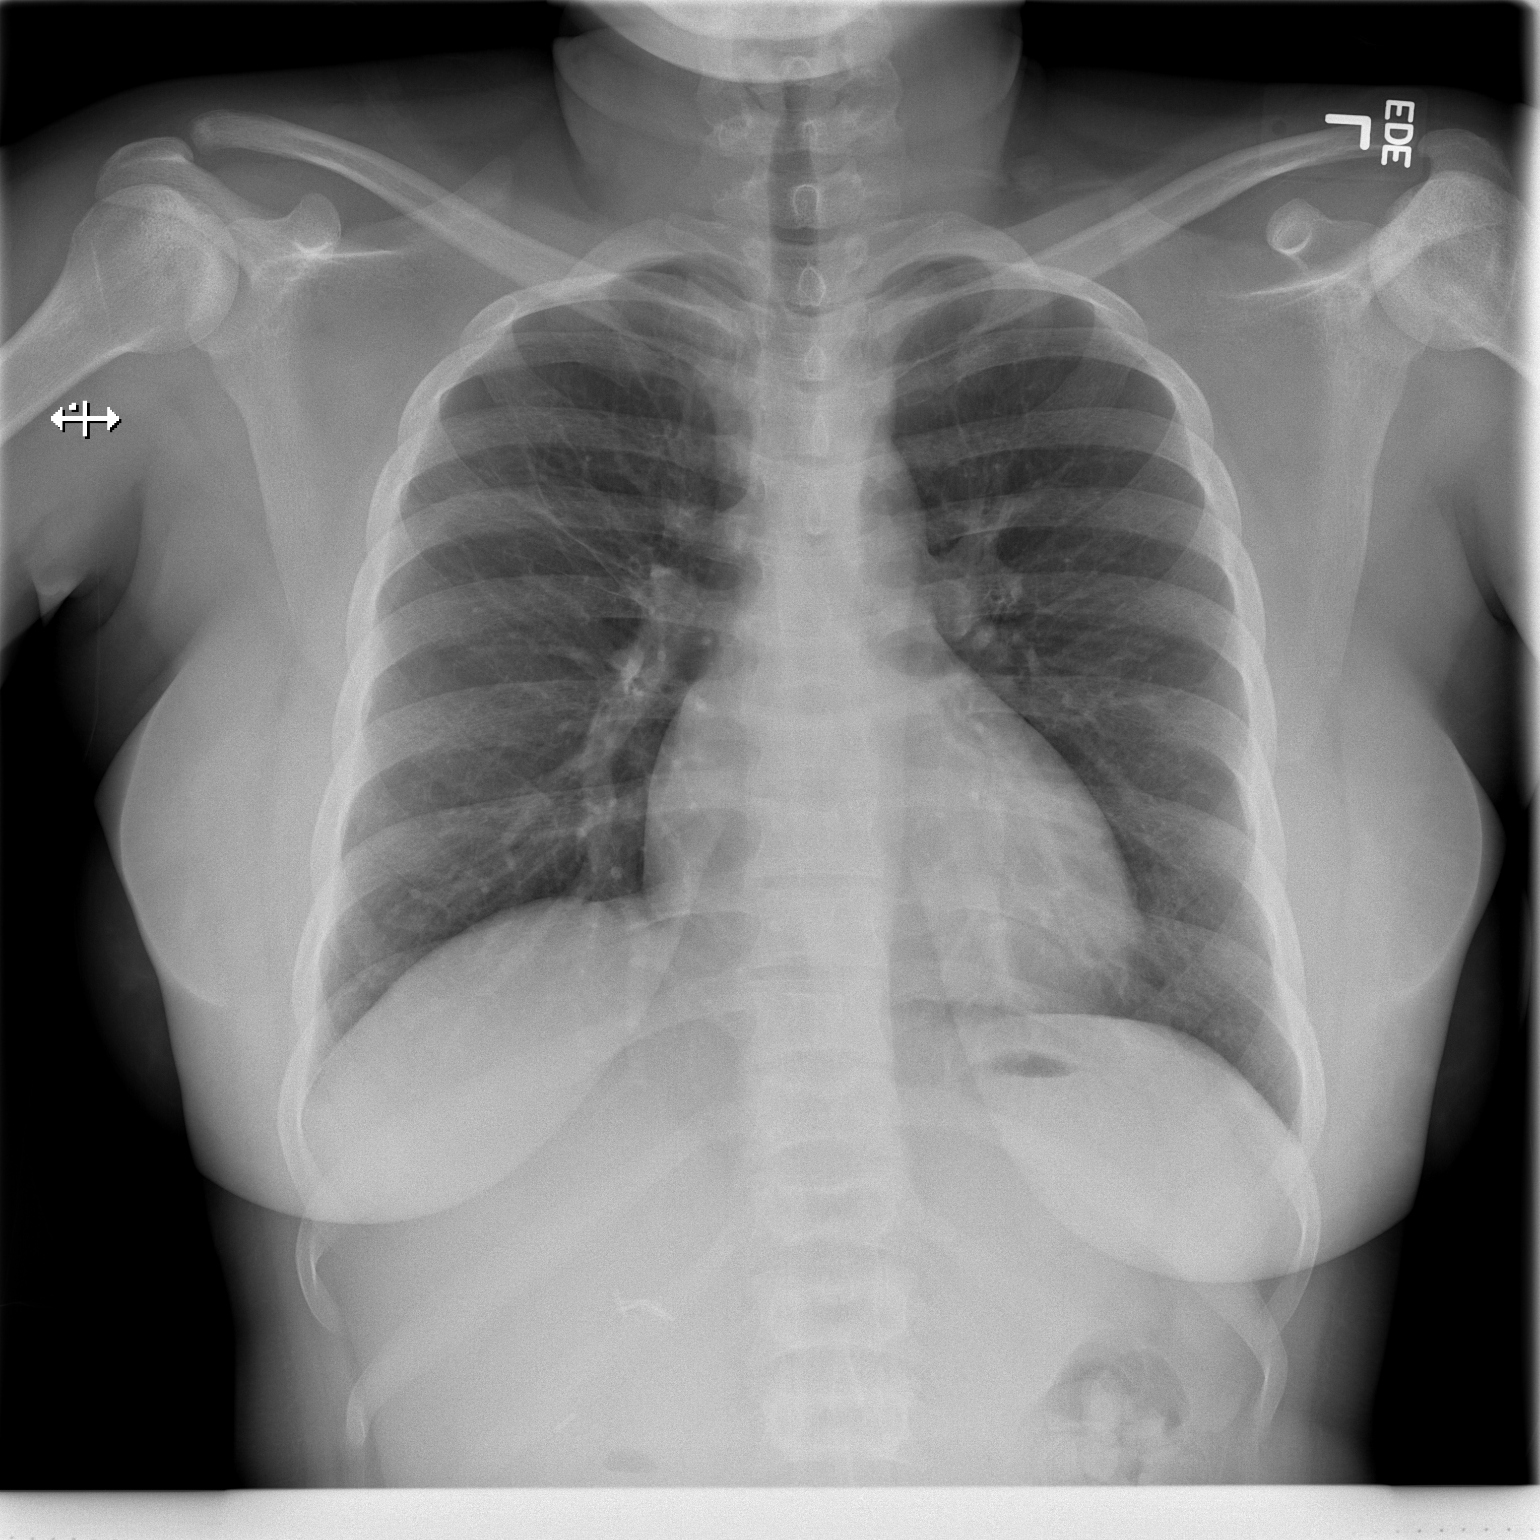

[w chest lat]
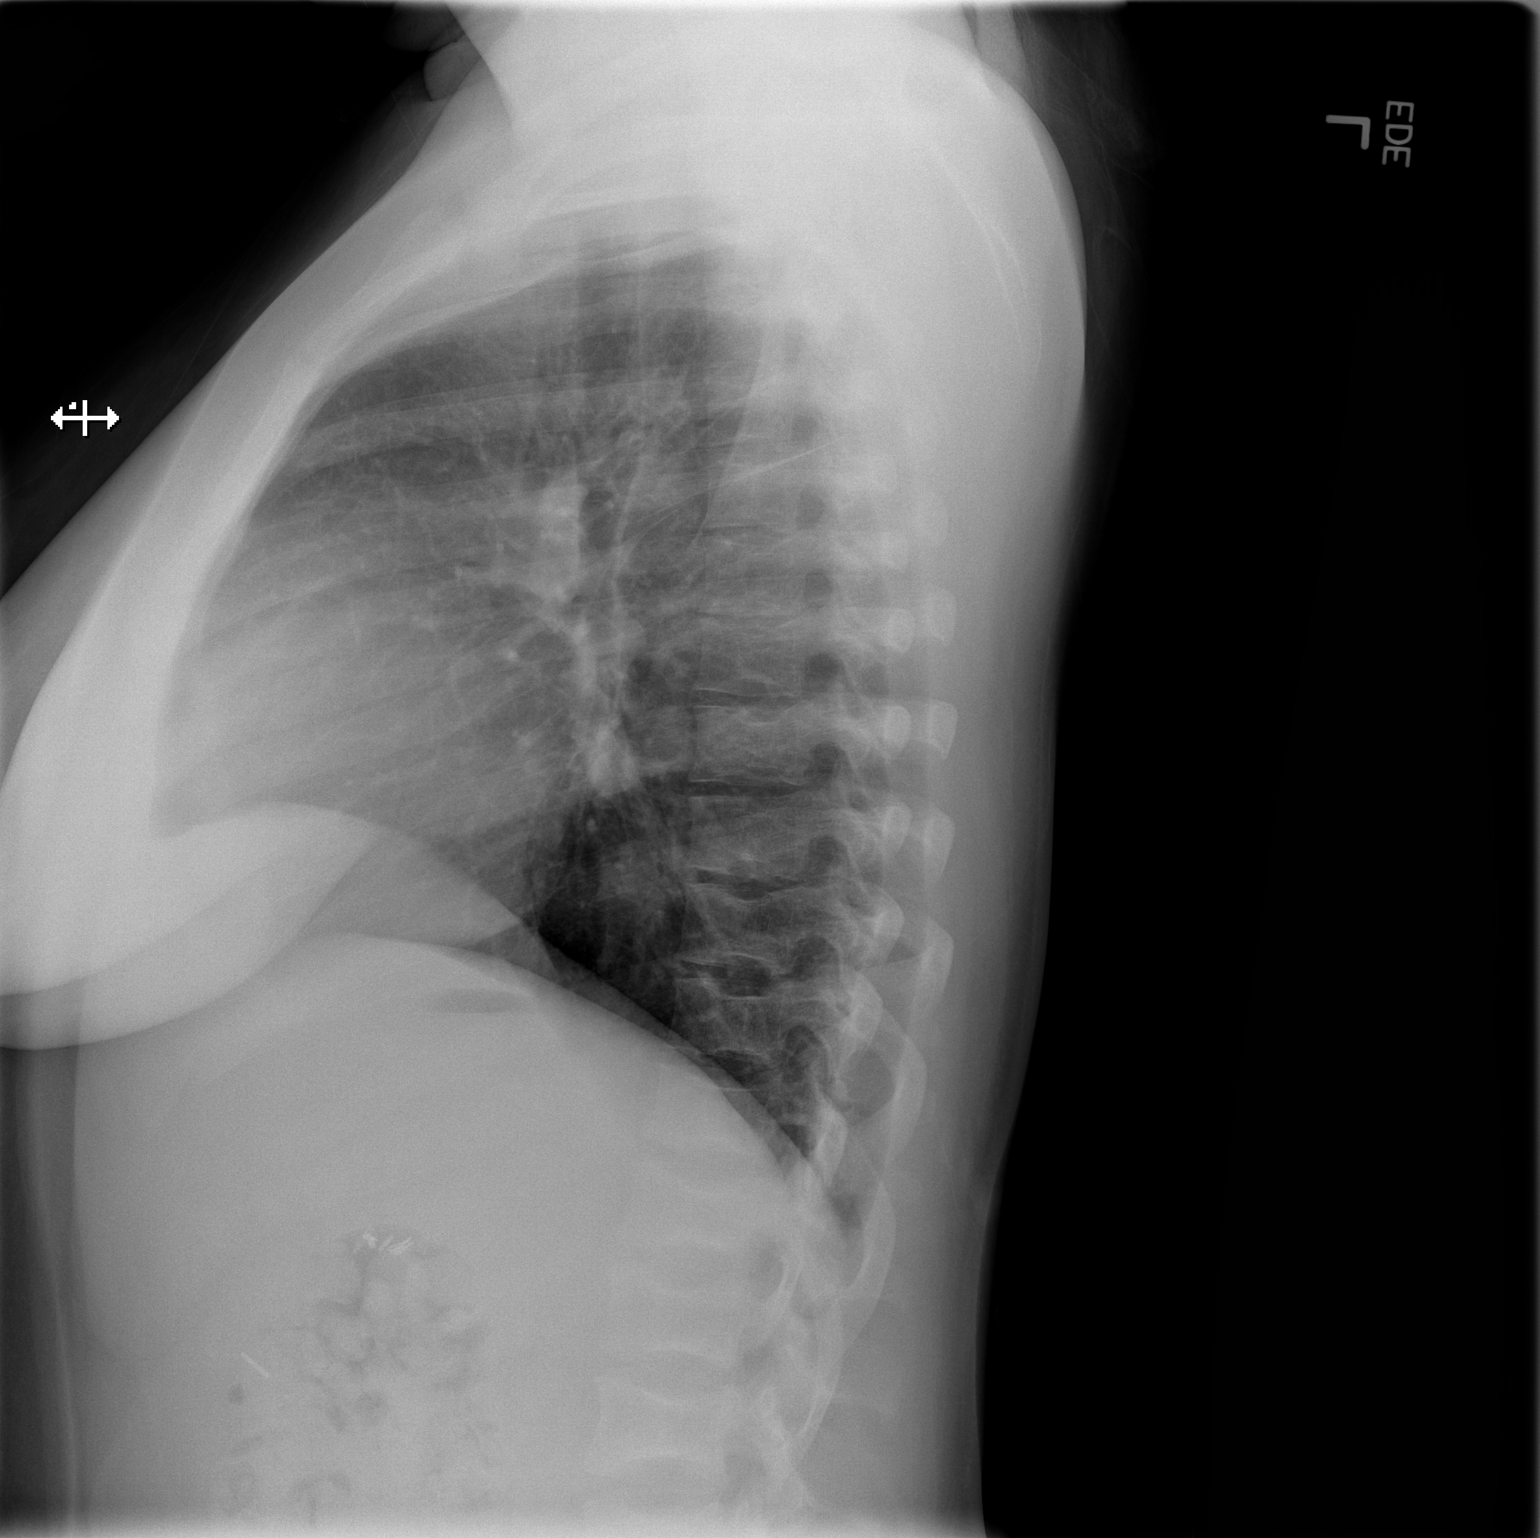

[2 of 2 positions shown; findings below may reference images not displayed]

FINDINGS: Heart size is upper normal and stable.  Mediastinal and
hilar contours are normal.  Pulmonary vascularity is normal.  No
focal airspace disease, pulmonary edema, effusion, or pneumothorax.

H-shaped vertebral bodies and patchy sclerosis consistent with AVN
of the humeral heads bilaterally is consistent with a history of
sickle cell disease.  No acute bony abnormality compared to prior
studies.  Cholecystectomy clips noted.
IMPRESSION: 1.  Stable chest radiograph.  No acute cardiopulmonary disease
identified.
2.  Stable bony changes related to sickle cell disease.

## 2012-02-13 IMAGING — CR DG CHEST 2V
2 series · 2 of 2 positions shown · non-contrast
Comparison: Chest x-ray of 03/29/2011

CLINICAL DATA: Acute chest syndrome, sickle cell disease

CHEST - 2 VIEW

[w chest pa]
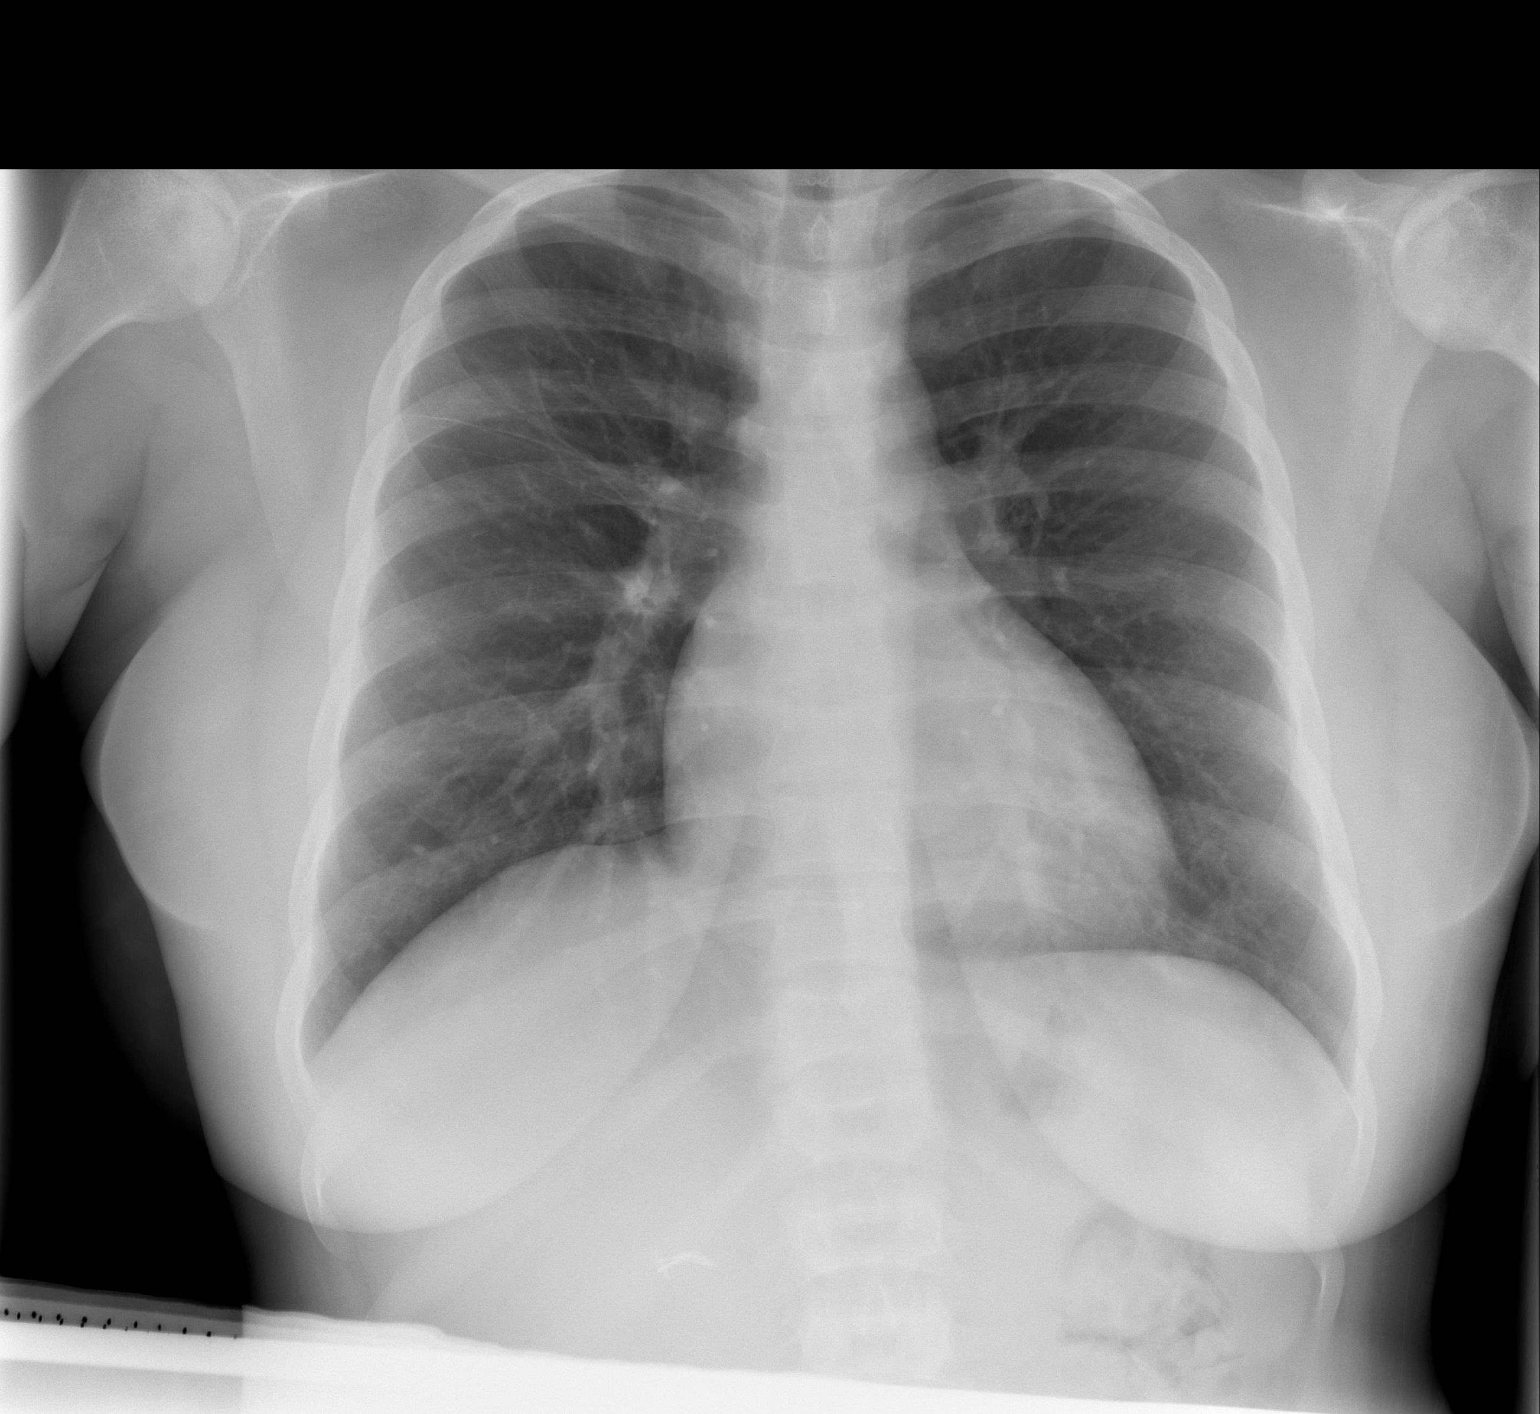

[w chest lat]
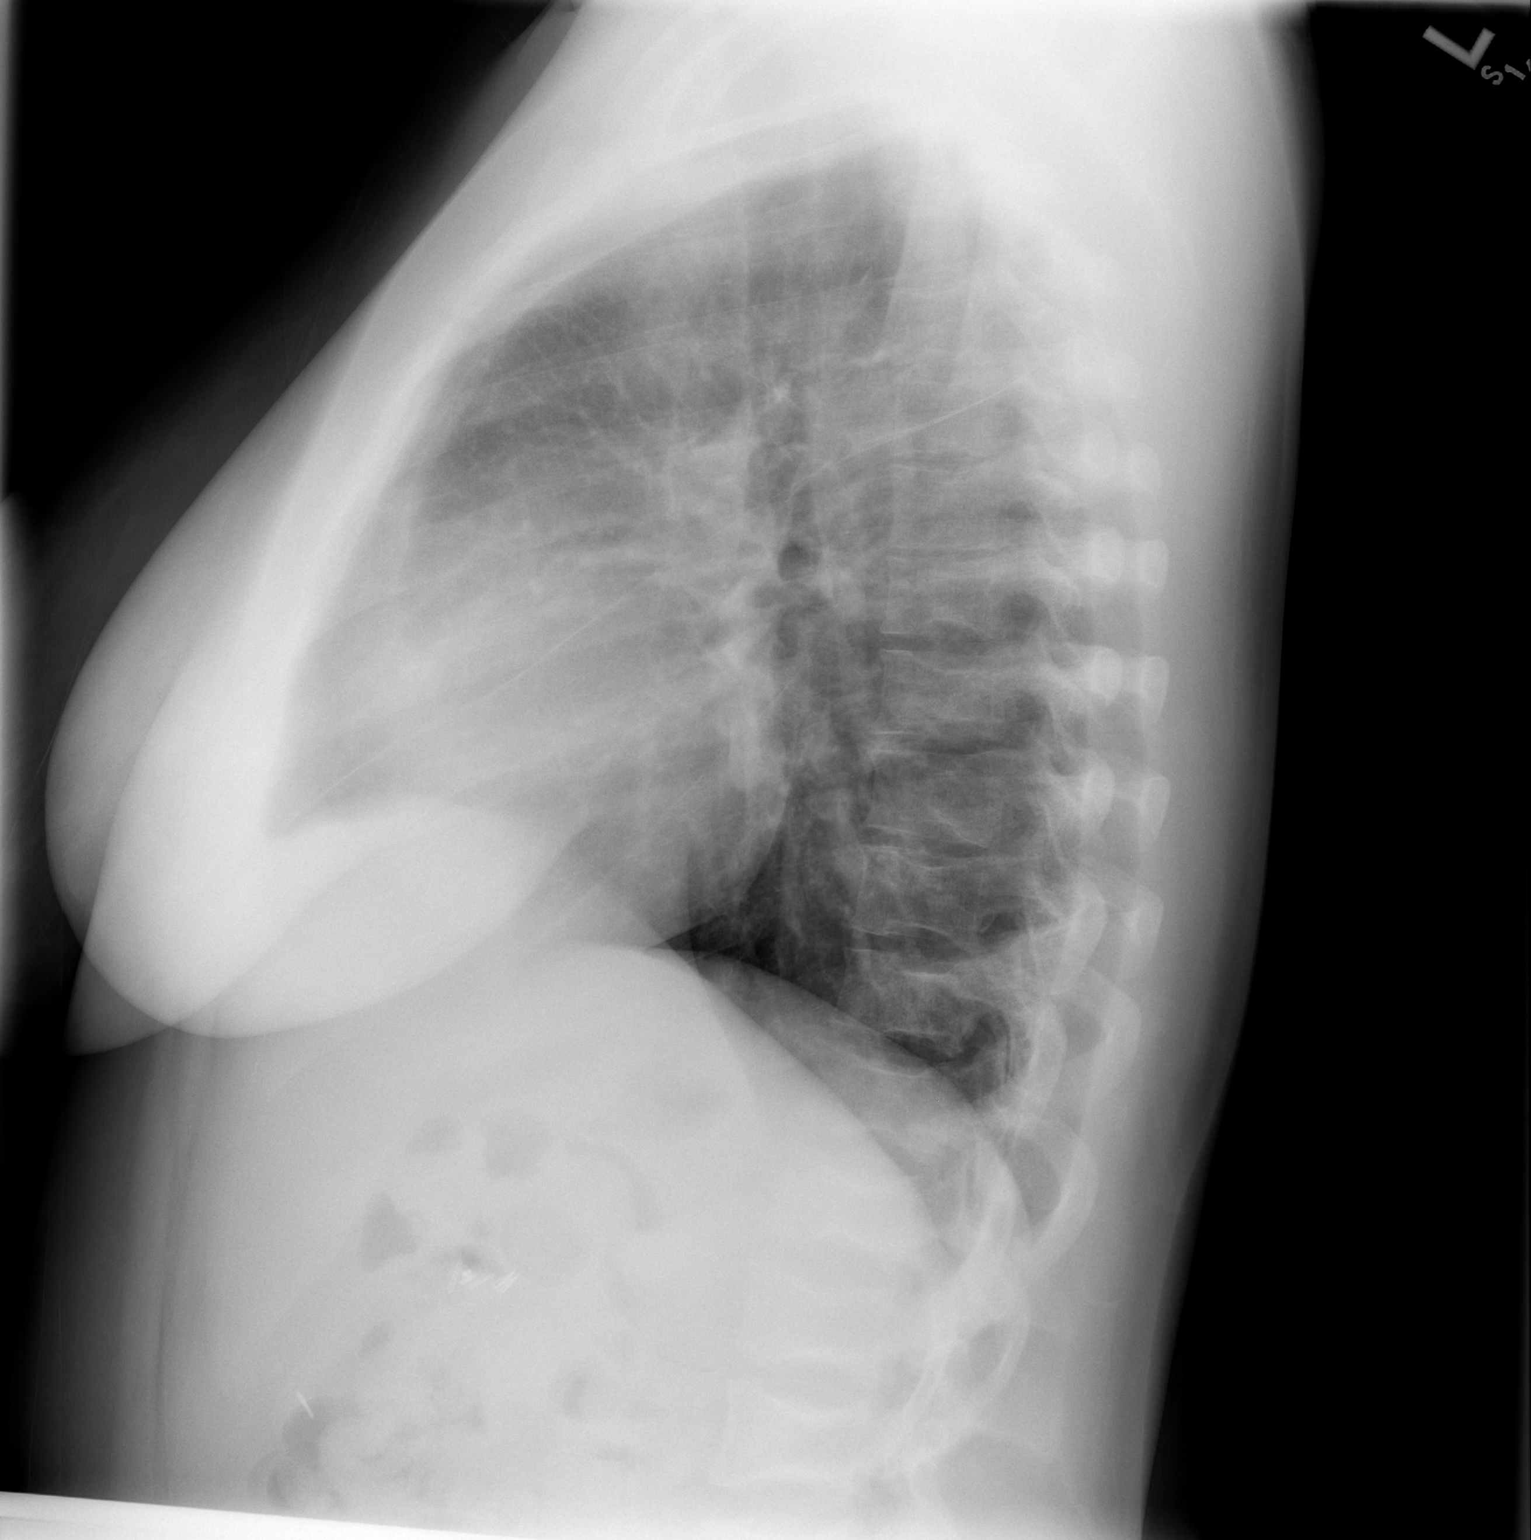

[2 of 2 positions shown; findings below may reference images not displayed]

FINDINGS: The lungs are clear.  Mediastinal contours appear normal.
The heart is within normal limits to upper limits of normal.  Bony
changes of sickle cell disease are noted.  Surgical clips are
present in the right upper quadrant from prior cholecystectomy.
IMPRESSION: 1.  No active lung disease.
2.  Bony changes of sickle cell disease.

## 2012-02-13 MED ORDER — HYDROMORPHONE HCL PF 2 MG/ML IJ SOLN: 2.0000 mg | Freq: Once | INTRAMUSCULAR | Status: DC

## 2012-02-13 MED ORDER — ACETAMINOPHEN 650 MG RE SUPP: 650.0000 mg | RECTAL | Status: DC | PRN

## 2012-02-13 MED ORDER — HYDROXYUREA 500 MG PO CAPS
1000.0000 mg | ORAL_CAPSULE | Freq: Every day | ORAL | Status: DC
  Administered 2012-02-13 – 2012-02-16 (×4): 1000 mg via ORAL
  Filled 2012-02-13 (×4): qty 2

## 2012-02-13 MED ORDER — SODIUM CHLORIDE 0.9 % IV SOLN
INTRAVENOUS | Status: DC
  Administered 2012-02-13 – 2012-02-14 (×5): via INTRAVENOUS

## 2012-02-13 MED ORDER — HYDROXYZINE HCL 25 MG PO TABS
25.0000 mg | ORAL_TABLET | ORAL | Status: DC | PRN
  Administered 2012-02-14: 25 mg via ORAL
  Filled 2012-02-13: qty 2

## 2012-02-13 MED ORDER — DOCUSATE SODIUM 100 MG PO CAPS
100.0000 mg | ORAL_CAPSULE | Freq: Two times a day (BID) | ORAL | Status: DC
  Administered 2012-02-13 – 2012-02-16 (×7): 100 mg via ORAL
  Filled 2012-02-13 (×8): qty 1

## 2012-02-13 MED ORDER — HYDROMORPHONE HCL PF 1 MG/ML IJ SOLN
1.0000 mg | Freq: Once | INTRAMUSCULAR | Status: AC
  Administered 2012-02-13: 1 mg via INTRAVENOUS
  Filled 2012-02-13: qty 1

## 2012-02-13 MED ORDER — HYDROMORPHONE HCL PF 2 MG/ML IJ SOLN
4.0000 mg | Freq: Once | INTRAMUSCULAR | Status: DC
  Administered 2012-02-13: 4 mg via INTRAVENOUS

## 2012-02-13 MED ORDER — ONDANSETRON HCL 4 MG/2ML IJ SOLN
4.0000 mg | Freq: Once | INTRAMUSCULAR | Status: AC
  Administered 2012-02-13: 4 mg via INTRAVENOUS

## 2012-02-13 MED ORDER — MORPHINE SULFATE 2 MG/ML IJ SOLN
2.0000 mg | INTRAMUSCULAR | Status: DC | PRN
  Administered 2012-02-15 – 2012-02-16 (×2): 4 mg via INTRAVENOUS
  Filled 2012-02-13 (×2): qty 2

## 2012-02-13 MED ORDER — ONDANSETRON HCL 4 MG/2ML IJ SOLN
4.0000 mg | INTRAMUSCULAR | Status: DC | PRN
  Administered 2012-02-13: 4 mg via INTRAVENOUS
  Filled 2012-02-13: qty 2

## 2012-02-13 MED ORDER — FOLIC ACID 1 MG PO TABS
1.0000 mg | ORAL_TABLET | Freq: Every day | ORAL | Status: DC
  Administered 2012-02-13 – 2012-02-16 (×4): 1 mg via ORAL
  Filled 2012-02-13 (×4): qty 1

## 2012-02-13 MED ORDER — ONDANSETRON HCL 4 MG/2ML IJ SOLN
4.0000 mg | Freq: Once | INTRAMUSCULAR | Status: AC
  Administered 2012-02-13: 4 mg via INTRAVENOUS
  Filled 2012-02-13: qty 2

## 2012-02-13 MED ORDER — HYDROMORPHONE HCL PF 2 MG/ML IJ SOLN
2.0000 mg | Freq: Once | INTRAMUSCULAR | Status: AC
  Administered 2012-02-13: 2 mg via INTRAVENOUS
  Filled 2012-02-13: qty 1

## 2012-02-13 MED ORDER — HYDROMORPHONE HCL PF 1 MG/ML IJ SOLN: 4.0000 mg | Freq: Once | INTRAMUSCULAR | Status: DC

## 2012-02-13 MED ORDER — ONDANSETRON HCL 4 MG/2ML IJ SOLN
INTRAMUSCULAR | Status: AC
  Administered 2012-02-13: 4 mg via INTRAVENOUS
  Filled 2012-02-13: qty 2

## 2012-02-13 MED ORDER — PANTOPRAZOLE SODIUM 40 MG IV SOLR
40.0000 mg | Freq: Every day | INTRAVENOUS | Status: DC
  Filled 2012-02-13 (×2): qty 40

## 2012-02-13 MED ORDER — ACETAMINOPHEN 325 MG PO TABS
650.0000 mg | ORAL_TABLET | ORAL | Status: DC | PRN
  Administered 2012-02-16: 650 mg via ORAL
  Filled 2012-02-13: qty 2

## 2012-02-13 MED ORDER — CHLORHEXIDINE GLUCONATE 0.12 % MT SOLN
15.0000 mL | Freq: Two times a day (BID) | OROMUCOSAL | Status: DC
  Administered 2012-02-13 – 2012-02-16 (×3): 15 mL via OROMUCOSAL
  Filled 2012-02-13 (×10): qty 15

## 2012-02-13 MED ORDER — ONDANSETRON HCL 4 MG PO TABS: 4.0000 mg | ORAL_TABLET | ORAL | Status: DC | PRN

## 2012-02-13 MED ORDER — INFLUENZA VIRUS VACC SPLIT PF IM SUSP
0.5000 mL | INTRAMUSCULAR | Status: AC
  Filled 2012-02-13: qty 0.5

## 2012-02-13 MED ORDER — OXYCODONE HCL 5 MG PO TABS
15.0000 mg | ORAL_TABLET | ORAL | Status: DC | PRN
  Administered 2012-02-14 – 2012-02-16 (×2): 15 mg via ORAL
  Filled 2012-02-13 (×2): qty 3

## 2012-02-13 MED ORDER — HYDROMORPHONE HCL PF 1 MG/ML IJ SOLN
2.0000 mg | INTRAMUSCULAR | Status: DC | PRN
  Administered 2012-02-13 – 2012-02-16 (×23): 2 mg via INTRAVENOUS
  Filled 2012-02-13 (×25): qty 2

## 2012-02-13 MED ORDER — ALPRAZOLAM 0.25 MG PO TABS
0.2500 mg | ORAL_TABLET | Freq: Three times a day (TID) | ORAL | Status: DC
  Administered 2012-02-13 – 2012-02-14 (×6): 0.25 mg via ORAL
  Administered 2012-02-15 (×3): 0.5 mg via ORAL
  Administered 2012-02-16 (×2): 0.25 mg via ORAL
  Filled 2012-02-13: qty 1
  Filled 2012-02-13 (×4): qty 2
  Filled 2012-02-13 (×6): qty 1

## 2012-02-13 MED ORDER — BIOTENE DRY MOUTH MT LIQD
15.0000 mL | Freq: Two times a day (BID) | OROMUCOSAL | Status: DC
  Administered 2012-02-13 – 2012-02-16 (×7): 15 mL via OROMUCOSAL

## 2012-02-13 MED ORDER — ENOXAPARIN SODIUM 40 MG/0.4ML ~~LOC~~ SOLN
40.0000 mg | SUBCUTANEOUS | Status: DC
  Administered 2012-02-14 – 2012-02-16 (×3): 40 mg via SUBCUTANEOUS
  Filled 2012-02-13 (×4): qty 0.4

## 2012-02-13 MED ORDER — PANTOPRAZOLE SODIUM 40 MG PO TBEC
40.0000 mg | DELAYED_RELEASE_TABLET | Freq: Every day | ORAL | Status: DC
Start: 2012-02-13 — End: 2012-02-17
  Administered 2012-02-13 – 2012-02-16 (×4): 40 mg via ORAL
  Filled 2012-02-13 (×4): qty 1

## 2012-02-13 MED ORDER — HYDROMORPHONE HCL PF 1 MG/ML IJ SOLN
INTRAMUSCULAR | Status: AC
  Administered 2012-02-13: 4 mg
  Filled 2012-02-13: qty 4

## 2012-02-13 MED ORDER — HYDROMORPHONE HCL PF 2 MG/ML IJ SOLN
INTRAMUSCULAR | Status: AC
  Administered 2012-02-13: 2 mg via INTRAVENOUS
  Filled 2012-02-13: qty 1

## 2012-02-13 NOTE — H&P (Addendum)
PCP:   Willey Blade, MD    Chief Complaint:   Jaw pain and right hip pain  HPI: [name] is a 21 y.o. female   has a past medical history of H/O: 1 miscarriage (03/22/2011); TRICHOTILLOMANIA (01/08/2009); Depression (01/06/2011); GERD (gastroesophageal reflux disease) (02/17/2011); Trichotillomania; Blood transfusion; Sickle cell anemia with crisis; Exertional dyspnea; Sickle cell anemia; Headache; Migraines (11/08/11); Chronic back pain; and Mood swings (11/08/11).   Presented with  Started yesterday she had some bad headache and upper jaw pain radiating to her teeth. She states she had hx of pain crisis in her jaw in the past. She took her last dose of oxycodone but could not rest. Patient states that now she has run out of her oxycodone. She had few bouts of nausea and vomiting.   Review of Systems:    Pertinent positives include: nausea, vomiting, headache, she have had some mucus drainage  Constitutional:  No weight loss, night sweats, Fevers, chills, fatigue, weight loss  HEENT:  No headaches, Difficulty swallowing,Tooth/dental problems,Sore throat,  No sneezing, itching, ear ache, nasal congestion, post nasal drip,  Cardio-vascular:  No chest pain, Orthopnea, PND, anasarca, dizziness, palpitations.no Bilateral lower extremity swelling  GI:  No heartburn, indigestion, abdominal pain,  diarrhea, change in bowel habits, loss of appetite, melena, blood in stool, hematemesis Resp:  no shortness of breath at rest. No dyspnea on exertion, No excess mucus, no productive cough, No non-productive cough, No coughing up of blood.No change in color of mucus.No wheezing. Skin:  no rash or lesions. No jaundice GU:  no dysuria, change in color of urine, no urgency or frequency. No straining to urinate.  No flank pain.  Musculoskeletal:  No joint pain or no joint swelling. No decreased range of motion. No back pain.  Psych:  No change in mood or affect. No depression or anxiety. No  memory loss.  Neuro: no localizing neurological complaints, no tingling, no weakness, no double vision, no gait abnormality, no slurred speech, no confusion  Otherwise ROS are negative except for above, 10 systems were reviewed  Past Medical History: Past Medical History  Diagnosis Date  . H/O: 1 miscarriage 03/22/2011  . 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 Depression 01/06/2011  . GERD (gastroesophageal reflux disease) 02/17/2011  . Trichotillomania     h/o  . Blood transfusion     "lots"  . Sickle cell anemia with crisis   . Exertional dyspnea     "sometimes"  . Sickle cell anemia   . Headache   . Migraines 11/08/11    "@ least twice/month"  . Chronic back pain     "very severe; have knot in my back; from tight muscle; take RX and exercise for it"  . Mood swings 11/08/11    "I go back and forth; real bad"   Past Surgical History  Procedure Date  . Cholecystectomy 05/2010  . Dilation and curettage of uterus 02/20/11    S/P miscarriage     Medications: Prior to Admission medications   Medication Sig Start Date End Date Taking? Authorizing Provider  hydroxyurea (HYDREA) 500 MG capsule Take 1,000 mg by mouth daily. May take with food to minimize GI side effects. 08/22/11  Yes Dawn Wilson Singer, MD  oxyCODONE (ROXICODONE) 15 MG immediate release tablet Take 15 mg by mouth every 4 (four) hours as needed. For pain   Yes Historical Provider, MD    Allergies:   Allergies  Allergen Reactions  . Latex Rash  . Toradol (Ketorolac Tromethamine)  Hives and Itching    Social History:  Ambulatory  independently Lives at  home   reports that she has never smoked. She has never used smokeless tobacco. She reports that she uses illicit drugs (Marijuana and Other-see comments). She reports that she does not drink alcohol.   Family History: family history includes Diabetes in her mother.    Physical Exam: Patient Vitals for the past 24 hrs:  BP Temp Temp src Pulse Resp SpO2  02/13/12 0317  130/79 mmHg - - 99  18  94 %  02/12/12 2016 102/64 mmHg 98.7 F (37.1 C) Oral 93  16  98 %    1. General:  in No Acute distress 2. Psychological: Alert and Oriented 3. Head/ENT:   Moist  Mucous Membranes                          Head Non traumatic, neck supple, tender over maxillary sinuses                          Normal  Dentition 4. SKIN: normal  Skin turgor,  Skin clean Dry and intact no rash 5. Heart: Regular rate and rhythm no Murmur, Rub or gallop 6. Lungs: Clear to auscultation bilaterally, no wheezes or crackles   7. Abdomen: Soft, non-tender, Non distended 8. Lower extremities: no clubbing, cyanosis, or edema 9. Neurologically Grossly intact, moving all 4 extremities equally 10. MSK: Normal range of motion  body mass index is unknown because there is no height or weight on file.   Labs on Admission:   [facility] 02/12/12 2106  NA 137  K 3.7  CL 103  CO2 26  GLUCOSE 95  BUN 5*  CREATININE 0.59  CALCIUM 9.3  MG --  PHOS --    Basename 02/12/12 2106  AST 22  ALT 17  ALKPHOS 68  BILITOT 1.7*  PROT 7.4  ALBUMIN 3.9   No results found for this basename: LIPASE:2,AMYLASE:2 in the last 72 hours  Basename 02/12/12 2106  WBC 11.8*  NEUTROABS 7.3  HGB 9.5*  HCT 26.7*  MCV 90.8  PLT 441*   No results found for this basename: CKTOTAL:3,CKMB:3,CKMBINDEX:3,TROPONINI:3 in the last 72 hours No results found for this basename: TSH,T4TOTAL,FREET3,T3FREE,THYROIDAB in the last 72 hours No results found for this basename: VITAMINB12:2,FOLATE:2,FERRITIN:2,TIBC:2,IRON:2,RETICCTPCT:2 in the last 72 hours No results found for this basename: HGBA1C    The CrCl is unknown because both a height and weight (above a minimum accepted value) are required for this calculation. ABG    Component Value Date/Time   TCO2 23 09/04/2011 2159     No results found for this basename: DDIMER       Cultures:    Component Value Date/Time   SDES URINE, RANDOM 11/29/2011 2324    SPECREQUEST NONE 11/29/2011 2324   CULT ENTEROCOCCUS SPECIES 11/29/2011 2324   REPTSTATUS 12/03/2011 FINAL 11/29/2011 2324       Radiological Exams on Admission: No results found.  Chart has been reviewed  Assessment/Plan  21 yo F w hx of sickle cell  here with atypical presentation with headache and jaw pain asociated with nausea and vomiting.   Present on Admission:  .Vaso-occlusive sickle cell crisis - her presentation is fairly unusual will admit for observation and treat for pain.  Marland KitchenHeadache and jaw pain - would obtain CT of the head to evaluate for possible sinusitis.  .Nausea & vomiting - will  obtain KUB given history of trichomania to evaluate for bezoar.   Prophylaxis: SCD, Protonix  CODE STATUS: FULL CODE  Other plan as per orders.  I have spent a total of 55 min on this admission  Ayleah Hofmeister 02/13/2012, 4:41 AM

## 2012-02-13 NOTE — ED Notes (Addendum)
Back to stretcher w/o incident, no changes. C/o pain, itching and nausea. Rates pain 8/10. IV flushed. Site U. Visitor at [facility]. Admitting MD into room. Pt removed gown, covered with blanket.

## 2012-02-13 NOTE — ED Provider Notes (Addendum)
History     CSN: [id number]  Arrival date & time 02/12/12  2009   First MD Initiated Contact with Patient 02/12/12 2301      Chief Complaint  Patient presents with  . Sickle Cell Pain Crisis    (Consider location/radiation/quality/duration/timing/severity/associated sxs/prior treatment) HPI  This patient is a 21 year old woman with history of sickle cell anemia. She presents with complaints of sickle cell crisis. She has pain in the right TMJ region, the right hip her pain was 10 over 10 at worst. It is now 8/10. She tried taking prescribed oxycodone at 15 mg with only mild relief. She has had some intermittent nausea. She denies abdominal pain, fever, chest pain, shortness of breath. Patient had her symptoms are similar to multiple previous sickle cell crises. Her last admission for sickle cell crease this was about 6 weeks ago. She says this is not daily she needs to be admitted tonight.  The patient denies any exacerbating or relieving factors with regards to her pain.   Past Medical History  Diagnosis Date  . H/O: 1 miscarriage 03/22/2011  . 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 Depression 01/06/2011  . GERD (gastroesophageal reflux disease) 02/17/2011  . Trichotillomania     h/o  . Blood transfusion     "lots"  . Sickle cell anemia with crisis   . Exertional dyspnea     "sometimes"  . Sickle cell anemia   . Headache   . Migraines 11/08/11    "@ least twice/month"  . Chronic back pain     "very severe; have knot in my back; from tight muscle; take RX and exercise for it"  . Mood swings 11/08/11    "I go back and forth; real bad"    Past Surgical History  Procedure Date  . Cholecystectomy 05/2010  . Dilation and curettage of uterus 02/20/11    S/P miscarriage    History reviewed. No pertinent family history.  History  Substance Use Topics  . Smoking status: Never Smoker   . Smokeless tobacco: Never Used  . Alcohol Use: No     pt states she quit in May    OB History      Grav Para Term Preterm Abortions TAB SAB Ect Mult Living   1    1           Review of Systems  Gen: no weight loss, fevers, chills, night sweats Eyes: no discharge or drainage, no occular pain or visual changes Nose: no epistaxis or rhinorrhea Mouth: no dental pain, no sore throat Neck: no neck pain Lungs: no SOB, cough, wheezing CV: no chest pain, palpitations, dependent edema or orthopnea Abd: as per hpi, otherwise negative GU: no dysuria or gross hematuria MSK: as per hpi, otherwise negative Neuro: no headache, no focal neurologic deficits Skin: no rash Psyche: negative.  Allergies  Latex and Toradol  Home Medications   Current Outpatient Rx  Name Route Sig Dispense Refill  . HYDROXYUREA 500 MG PO CAPS Oral Take 1,000 mg by mouth daily. May take with food to minimize GI side effects.    . OXYCODONE HCL 15 MG PO TABS Oral Take 15 mg by mouth every 4 (four) hours as needed. For pain      BP 102/64  Pulse 93  Temp 98.7 F (37.1 C) (Oral)  Resp 16  SpO2 98%  Physical Exam  Gen: well developed and well nourished appearing Head: NCAT Eyes: PERL, EOMI Nose: no epistaixis or rhinorrhea Mouth/throat: mucosa is moist  and pink, ttp over the TMJ, No dental pain to percussion or caries appreciated.  Neck: supple, no stridor,no adenopathy Lungs: CTA B, no wheezing, rhonchi or rales CV: RRR, no murmur, ext appear well perfused Abd: soft, notender, nondistended Back: no ttp, no cva ttp Skin: no rashese, wnl Neuro: CN ii-xii grossly intact, no focal deficits MSK: pain on palpation of the right hip but FROM of this joint. Distal NV intact, Remainder of the LE and both UE are nontender.  Psyche; normal affect,  calm and cooperative.   ED Course  Procedures (including critical care time)  Results for orders placed during the hospital encounter of 02/12/12 (from the past 24 hour(s))  URINALYSIS, ROUTINE W REFLEX MICROSCOPIC     Status: Normal   Collection Time   02/12/12   8:33 PM      Component Value Range   Color, Urine YELLOW  YELLOW   APPearance CLEAR  CLEAR   Specific Gravity, Urine 1.012  1.005 - 1.030   pH 5.5  5.0 - 8.0   Glucose, UA NEGATIVE  NEGATIVE mg/dL   Hgb urine dipstick NEGATIVE  NEGATIVE   Bilirubin Urine NEGATIVE  NEGATIVE   Ketones, ur NEGATIVE  NEGATIVE mg/dL   Protein, ur NEGATIVE  NEGATIVE mg/dL   Urobilinogen, UA 1.0  0.0 - 1.0 mg/dL   Nitrite NEGATIVE  NEGATIVE   Leukocytes, UA NEGATIVE  NEGATIVE  POCT PREGNANCY, URINE     Status: Normal   Collection Time   02/12/12  8:37 PM      Component Value Range   Preg Test, Ur NEGATIVE  NEGATIVE  CBC WITH DIFFERENTIAL     Status: Abnormal   Collection Time   02/12/12  9:06 PM      Component Value Range   WBC 11.8 (*) 4.0 - 10.5 K/uL   RBC 2.94 (*) 3.87 - 5.11 MIL/uL   Hemoglobin 9.5 (*) 12.0 - 15.0 g/dL   HCT 18.8 (*) 41.6 - 60.6 %   MCV 90.8  78.0 - 100.0 fL   MCH 32.3  26.0 - 34.0 pg   MCHC 35.6  30.0 - 36.0 g/dL   RDW 30.1 (*) 60.1 - 09.3 %   Platelets 441 (*) 150 - 400 K/uL   Neutrophils Relative 62  43 - 77 %   Neutro Abs 7.3  1.7 - 7.7 K/uL   Lymphocytes Relative 23  12 - 46 %   Lymphs Abs 2.8  0.7 - 4.0 K/uL   Monocytes Relative 11  3 - 12 %   Monocytes Absolute 1.3 (*) 0.1 - 1.0 K/uL   Eosinophils Relative 3  0 - 5 %   Eosinophils Absolute 0.3  0.0 - 0.7 K/uL   Basophils Relative 1  0 - 1 %   Basophils Absolute 0.1  0.0 - 0.1 K/uL  COMPREHENSIVE METABOLIC PANEL     Status: Abnormal   Collection Time   02/12/12  9:06 PM      Component Value Range   Sodium 137  135 - 145 mEq/L   Potassium 3.7  3.5 - 5.1 mEq/L   Chloride 103  96 - 112 mEq/L   CO2 26  19 - 32 mEq/L   Glucose, Bld 95  70 - 99 mg/dL   BUN 5 (*) 6 - 23 mg/dL   Creatinine, Ser 2.35  0.50 - 1.10 mg/dL   Calcium 9.3  8.4 - 57.3 mg/dL   Total Protein 7.4  6.0 - 8.3 g/dL  Albumin 3.9  3.5 - 5.2 g/dL   AST 22  0 - 37 U/L   ALT 17  0 - 35 U/L   Alkaline Phosphatase 68  39 - 117 U/L   Total Bilirubin  1.7 (*) 0.3 - 1.2 mg/dL   GFR calc non Af Amer >90  >90 mL/min   GFR calc Af Amer >90  >90 mL/min      MDM  DDX: sickle cell crisis, significant anemia requiring transfusion, AVN  Patient noted to have H/H at baseline. No signs of acute infection. Patient appears to be experiencing sickle cell crisis.  Feeling better after dilaudid 1mg  [place] followed by 2mg  IV and some IVF.  Asks for a 3rd dose. Will re-evaluate for disposition after that with anticipation of likely discharge home, based on patient's comments thus far.         Brandt Loosen, MD 02/13/12 0205  Patient re-evaluated at 0300.  Now crying in pain and saying she needs more pain medication and wants to know why she is hurting this badly. We will continue to manage symptomatically. I have recommended that the patient be admitted.   Brandt Loosen, MD 02/13/12 [phone]

## 2012-02-13 NOTE — Progress Notes (Signed)
Subjective:  Patient reports continued to pain which he rates as 8/10. She's complaining of right jaw pain as well as right hip pain. Denies chest pains or shortness of breath. She complains of ongoing stressors from multiple sources. Denies fever chills or night sweats. No cough. States her present medication is helping her at this time.   Allergies  Allergen Reactions  . Latex Rash  . Toradol (Ketorolac Tromethamine) Hives and Itching   Current Facility-Administered Medications  Medication Dose Route Frequency Provider Last Rate Last Dose  . 0.9 %  sodium chloride infusion   Intravenous Continuous Therisa Doyne, MD 150 mL/hr at 02/13/12 1734    . acetaminophen (TYLENOL) tablet 650 mg  650 mg Oral Q4H PRN Therisa Doyne, MD       Or  . acetaminophen (TYLENOL) suppository 650 mg  650 mg Rectal Q4H PRN Therisa Doyne, MD      . ALPRAZolam Prudy Feeler) tablet 0.25-0.5 mg  0.25-0.5 mg Oral TID Therisa Doyne, MD   0.25 mg at 02/13/12 1604  . antiseptic oral rinse (BIOTENE) solution 15 mL  15 mL Mouth Rinse q12n4p Anastassia Doutova, MD   15 mL at 02/13/12 1600  . chlorhexidine (PERIDEX) 0.12 % solution 15 mL  15 mL Mouth Rinse BID Therisa Doyne, MD   15 mL at 02/13/12 1007  . docusate sodium (COLACE) capsule 100 mg  100 mg Oral BID Therisa Doyne, MD   100 mg at 02/13/12 1008  . enoxaparin (LOVENOX) injection 40 mg  40 mg Subcutaneous Q24H Therisa Doyne, MD      . folic acid (FOLVITE) tablet 1 mg  1 mg Oral Daily Therisa Doyne, MD   1 mg at 02/13/12 1008  . HYDROmorphone (DILAUDID) 1 MG/ML injection        4 mg at 02/13/12 7829  . HYDROmorphone (DILAUDID) injection 1 mg  1 mg Subcutaneous Once Nadara Mustard, MD   1 mg at 02/12/12 2317  . HYDROmorphone (DILAUDID) injection 1 mg  1 mg Intravenous Once Brandt Loosen, MD   1 mg at 02/13/12 0609  . HYDROmorphone (DILAUDID) injection 2 mg  2 mg Intravenous Once Brandt Loosen, MD   2 mg at 02/13/12 0310  . HYDROmorphone  (DILAUDID) injection 2 mg  2 mg Intravenous Once Brandt Loosen, MD   2 mg at 02/13/12 0228  . HYDROmorphone (DILAUDID) injection 2 mg  2 mg Intravenous Q2H PRN Gwenyth Bender, MD   2 mg at 02/13/12 1907  . HYDROmorphone (DILAUDID) injection 4 mg  4 mg Intravenous Once Gwenyth Bender, MD      . hydroxyurea (HYDREA) capsule 1,000 mg  1,000 mg Oral Daily Therisa Doyne, MD   1,000 mg at 02/13/12 1008  . hydrOXYzine (ATARAX/VISTARIL) tablet 25-50 mg  25-50 mg Oral Q4H PRN Therisa Doyne, MD      . influenza  inactive virus vaccine (FLUZONE/FLUARIX) injection 0.5 mL  0.5 mL Intramuscular Tomorrow-1000 Therisa Doyne, MD      . morphine 2 MG/ML injection 2-4 mg  2-4 mg Intravenous Q4H PRN Therisa Doyne, MD      . ondansetron (ZOFRAN) injection 4 mg  4 mg Intravenous Once Brandt Loosen, MD   4 mg at 02/13/12 0042  . ondansetron (ZOFRAN) injection 4 mg  4 mg Intravenous Once Therisa Doyne, MD   4 mg at 02/13/12 0607  . ondansetron (ZOFRAN) tablet 4 mg  4 mg Oral Q4H PRN Therisa Doyne, MD       Or  . ondansetron (  ZOFRAN) injection 4 mg  4 mg Intravenous Q4H PRN Therisa Doyne, MD   4 mg at 02/13/12 0044  . ondansetron (ZOFRAN-ODT) disintegrating tablet 8 mg  8 mg Oral Once Hurman Horn, MD   8 mg at 02/12/12 2316  . oxyCODONE (Oxy IR/ROXICODONE) immediate release tablet 15 mg  15 mg Oral Q4H PRN Therisa Doyne, MD      . pantoprazole (PROTONIX) EC tablet 40 mg  40 mg Oral Q1200 Therisa Doyne, MD   40 mg at 02/13/12 1309   Or  . pantoprazole (PROTONIX) injection 40 mg  40 mg Intravenous Q1200 Anastassia Doutova, MD      . sodium chloride 0.9 % bolus 1,000 mL  1,000 mL Intravenous Once Brandt Loosen, MD   1,000 mL at 02/13/12 0028  . DISCONTD: HYDROmorphone (DILAUDID) injection 2 mg  2 mg Intravenous Once Brandt Loosen, MD      . DISCONTD: HYDROmorphone (DILAUDID) injection 2 mg  2 mg Intravenous Once Brandt Loosen, MD      . DISCONTD: HYDROmorphone (DILAUDID) injection 4 mg  4 mg  Intravenous Once Gwenyth Bender, MD   4 mg at 02/13/12 0845    Objective: Blood pressure 104/78, pulse 89, temperature 98.5 F (36.9 C), temperature source Oral, resp. rate 18, height 5\' 2"  (1.575 m), weight 194 lb 10.7 oz (88.3 kg), last menstrual period 02/08/2012, SpO2 95.00%.  Well-developed obese black female in no acute distress. She's been pulling her hair out associated with stressors. HEENT: Bilateral frontal and right maxillary sinus tenderness. NECK: No enlarged thyroid. Small right posterior cervical nodes. LUNGS: Clear to auscultation without wheezes. CV: Normal S1, S2 without S3. ABD: Soft, nontender. MSK: Tender right hip. Negative Hom[place]. NEURO: Intact.  Lab results: Results for orders placed during the hospital encounter of 02/12/12 (from the past 48 hour(s))  URINALYSIS, ROUTINE W REFLEX MICROSCOPIC     Status: Normal   Collection Time   02/12/12  8:33 PM      Component Value Range Comment   Color, Urine YELLOW  YELLOW    APPearance CLEAR  CLEAR    Specific Gravity, Urine 1.012  1.005 - 1.030    pH 5.5  5.0 - 8.0    Glucose, UA NEGATIVE  NEGATIVE mg/dL    Hgb urine dipstick NEGATIVE  NEGATIVE    Bilirubin Urine NEGATIVE  NEGATIVE    Ketones, ur NEGATIVE  NEGATIVE mg/dL    Protein, ur NEGATIVE  NEGATIVE mg/dL    Urobilinogen, UA 1.0  0.0 - 1.0 mg/dL    Nitrite NEGATIVE  NEGATIVE    Leukocytes, UA NEGATIVE  NEGATIVE MICROSCOPIC NOT DONE ON URINES WITH NEGATIVE PROTEIN, BLOOD, LEUKOCYTES, NITRITE, OR GLUCOSE <1000 mg/dL.  POCT PREGNANCY, URINE     Status: Normal   Collection Time   02/12/12  8:37 PM      Component Value Range Comment   Preg Test, Ur NEGATIVE  NEGATIVE   CBC WITH DIFFERENTIAL     Status: Abnormal   Collection Time   02/12/12  9:06 PM      Component Value Range Comment   WBC 11.8 (*) 4.0 - 10.5 K/uL    RBC 2.94 (*) 3.87 - 5.11 MIL/uL    Hemoglobin 9.5 (*) 12.0 - 15.0 g/dL    HCT 47.8 (*) 29.5 - 46.0 %    MCV 90.8  78.0 - 100.0 fL    MCH 32.3   26.0 - 34.0 pg    MCHC 35.6  30.0 -  36.0 g/dL    RDW 91.4 (*) 78.2 - 15.5 %    Platelets 441 (*) 150 - 400 K/uL    Neutrophils Relative 62  43 - 77 %    Neutro Abs 7.3  1.7 - 7.7 K/uL    Lymphocytes Relative 23  12 - 46 %    Lymphs Abs 2.8  0.7 - 4.0 K/uL    Monocytes Relative 11  3 - 12 %    Monocytes Absolute 1.3 (*) 0.1 - 1.0 K/uL    Eosinophils Relative 3  0 - 5 %    Eosinophils Absolute 0.3  0.0 - 0.7 K/uL    Basophils Relative 1  0 - 1 %    Basophils Absolute 0.1  0.0 - 0.1 K/uL   COMPREHENSIVE METABOLIC PANEL     Status: Abnormal   Collection Time   02/12/12  9:06 PM      Component Value Range Comment   Sodium 137  135 - 145 mEq/L    Potassium 3.7  3.5 - 5.1 mEq/L    Chloride 103  96 - 112 mEq/L    CO2 26  19 - 32 mEq/L    Glucose, Bld 95  70 - 99 mg/dL    BUN 5 (*) 6 - 23 mg/dL    Creatinine, Ser 9.56  0.50 - 1.10 mg/dL    Calcium 9.3  8.4 - 21.3 mg/dL    Total Protein 7.4  6.0 - 8.3 g/dL    Albumin 3.9  3.5 - 5.2 g/dL    AST 22  0 - 37 U/L    ALT 17  0 - 35 U/L    Alkaline Phosphatase 68  39 - 117 U/L    Total Bilirubin 1.7 (*) 0.3 - 1.2 mg/dL    GFR calc non Af Amer >90  >90 mL/min    GFR calc Af Amer >90  >90 mL/min   CBC     Status: Abnormal   Collection Time   02/13/12  8:23 AM      Component Value Range Comment   WBC 15.7 (*) 4.0 - 10.5 K/uL WHITE COUNT CONFIRMED ON SMEAR   RBC 2.55 (*) 3.87 - 5.11 MIL/uL    Hemoglobin 8.2 (*) 12.0 - 15.0 g/dL    HCT 08.6 (*) 57.8 - 46.0 %    MCV 91.4  78.0 - 100.0 fL    MCH 32.2  26.0 - 34.0 pg    MCHC 35.2  30.0 - 36.0 g/dL    RDW 46.9 (*) 62.9 - 15.5 %    Platelets 390  150 - 400 K/uL PLATELET COUNT CONFIRMED BY SMEAR  CREATININE, SERUM     Status: Normal   Collection Time   02/13/12  8:23 AM      Component Value Range Comment   Creatinine, Ser 0.60  0.50 - 1.10 mg/dL    GFR calc non Af Amer >90  >90 mL/min    GFR calc Af Amer >90  >90 mL/min     Studies/Results: Dg Abd 1 View  02/13/2012  *RADIOLOGY REPORT*   Clinical Data: Lower abdominal pain  ABDOMEN - 1 VIEW  Comparison: 06/22/2011  Findings: There is a moderate amount of fecal matter in the colon. The gas pattern is otherwise unremarkable.  No sign of ileus, obstruction, soft tissue calcification or significant bony finding. Biconcave vertebral bodies consistent with sickle cell.  The There are clips in the right upper quadrant consistent with previous cholecystectomy.  IMPRESSION:  Moderate amount of fecal matter in the colon.  Otherwise unremarkable.   Original Report Authenticated By: Thomasenia Sales, M.D.    Ct Head Wo Contrast  02/13/2012  *RADIOLOGY REPORT*  Clinical Data: Headache.  CT HEAD WITHOUT CONTRAST  Technique:  Contiguous axial images were obtained from the base of the skull through the vertex without contrast.  Comparison: April 17, 2011.  Findings: Bony calvarium is intact.  No mass effect or midline shift is noted.  Ventricular size is within normal limits.  There is no evidence of mass, hemorrhage or infarction.  IMPRESSION: No gross intracranial abnormality seen.   Original Report Authenticated By: Venita Sheffield., M.D.     Patient Active Problem List  Diagnosis  . SICKLE CELL ANEMIA  . TRICHOTILLOMANIA  . Active smoker  . Back pain  . Depression  . GERD (gastroesophageal reflux disease)  . Contraception management  . Stress  . Overweight  . Vaso-occlusive sickle cell crisis  . Right thigh pain  . Vaginosis  . Tachycardia  . Headache  . Nausea & vomiting    Impression: Sickle cell crisis. Mild active hemolysis. Sinus pain rule out occult sinusitis.CT scan of the head done. No comment on sinuses. Anxiety disorder. Right hip pain. History of bone infarcts. Depression. Overweight.   Plan: Incentive spirometry. T. IV fluids, and time pruritic. Taper IV pain medication as tolerated. Empiric antibiotics. Followup with the clinical social worker in counseling. Followup CBC, CMET in a.m.   August Saucer,  Sire Poet 02/13/2012 9:06 PM

## 2012-02-13 NOTE — Progress Notes (Signed)
Pt admitted to  5527 at 0640 from the ED Pt oriented to unit and room safety video currently playing, with boyfriend at the bedside. VSS - Blood pressure 108/66, pulse 104, temperature 98.5 F (36.9 C), temperature source Oral, resp. rate 18, height 5\' 2"  (1.575 m), weight 88.3 kg (194 lb 10.7 oz), SpO2 91.00%.RA. Olene Craven, RN

## 2012-02-13 NOTE — ED Notes (Signed)
Pt up to b/r, via w/c, visitor at side from room to b/r, c/o "not feeling well", pt alert, sleepy, sedated, interactive, self transferred with minimal assist from stretcher to w/c.

## 2012-02-14 MED ORDER — SODIUM CHLORIDE 0.9 % IJ SOLN
10.0000 mL | INTRAMUSCULAR | Status: DC | PRN
  Administered 2012-02-15 – 2012-02-16 (×2): 10 mL

## 2012-02-14 MED ORDER — MOXIFLOXACIN HCL 400 MG PO TABS
400.0000 mg | ORAL_TABLET | Freq: Every day | ORAL | Status: DC
  Administered 2012-02-14 – 2012-02-16 (×3): 400 mg via ORAL
  Filled 2012-02-14 (×4): qty 1

## 2012-02-14 MED ORDER — POTASSIUM CHLORIDE IN NACL 20-0.45 MEQ/L-% IV SOLN
INTRAVENOUS | Status: DC
  Administered 2012-02-14 – 2012-02-16 (×5): via INTRAVENOUS
  Filled 2012-02-14 (×6): qty 1000

## 2012-02-14 MED ORDER — SUMATRIPTAN SUCCINATE 50 MG PO TABS
50.0000 mg | ORAL_TABLET | ORAL | Status: DC | PRN
  Filled 2012-02-14: qty 1

## 2012-02-14 NOTE — Progress Notes (Signed)
Peripherally Inserted Central Catheter/Midline Placement  The IV Nurse has discussed with the patient and/or persons authorized to consent for the patient, the purpose of this procedure and the potential benefits and risks involved with this procedure.  The benefits include less needle sticks, lab draws from the catheter and patient may be discharged home with the catheter.  Risks include, but not limited to, infection, bleeding, blood clot (thrombus formation), and puncture of an artery; nerve damage and irregular heat beat.  Alternatives to this procedure were also discussed.  PICC/Midline Placement Documentation        Vevelyn Pat 02/14/2012, 3:24 PM

## 2012-02-14 NOTE — Progress Notes (Signed)
Peripheral IV infiltrated, 2 IV team RNs assessed for new site without success. Discussed PICC line or mid line possibilities with pt, pt refuses.   Contacted IV team department after pt's significant other had inquired about if a 3rd IV RN could come assess.  After speaking with the department AD, an IV team RN with an ultra sound machine used for PICC placements came to assess for peripheral site using ultrasound.   Dr Diamantina [place] office was called around 1215, and he returned call while IV team in assessing with ultra sound.  No venous access visualized with ultrasound, pt now agreeable to have a PICC placed and Dr August Saucer gave a verbal order for PICC placement.

## 2012-02-14 NOTE — Progress Notes (Signed)
Utilization review complete 

## 2012-02-14 NOTE — Progress Notes (Signed)
Subjective:  Patient gradually feeling better. She is complaining of headaches which she describes as a migraine headache. This is more on the right side of her head and is throbbing at times. She rates her pain as a 7/10. She continues to have intermittent right hip pain. She notes some sinus congestion which improving. She was started on Avelox today as well.   Allergies  Allergen Reactions  . Latex Rash  . Toradol (Ketorolac Tromethamine) Hives and Itching   Current Facility-Administered Medications  Medication Dose Route Frequency Provider Last Rate Last Dose  . 0.45 % NaCl with KCl 20 mEq / L infusion   Intravenous Continuous Gwenyth Bender, MD      . acetaminophen (TYLENOL) tablet 650 mg  650 mg Oral Q4H PRN Therisa Doyne, MD       Or  . acetaminophen (TYLENOL) suppository 650 mg  650 mg Rectal Q4H PRN Therisa Doyne, MD      . ALPRAZolam Prudy Feeler) tablet 0.25-0.5 mg  0.25-0.5 mg Oral TID Therisa Doyne, MD   0.25 mg at 02/14/12 1636  . antiseptic oral rinse (BIOTENE) solution 15 mL  15 mL Mouth Rinse q12n4p Therisa Doyne, MD   15 mL at 02/14/12 1637  . chlorhexidine (PERIDEX) 0.12 % solution 15 mL  15 mL Mouth Rinse BID Therisa Doyne, MD   15 mL at 02/13/12 1007  . docusate sodium (COLACE) capsule 100 mg  100 mg Oral BID Therisa Doyne, MD   100 mg at 02/14/12 1008  . enoxaparin (LOVENOX) injection 40 mg  40 mg Subcutaneous Q24H Therisa Doyne, MD   40 mg at 02/14/12 1631  . folic acid (FOLVITE) tablet 1 mg  1 mg Oral Daily Therisa Doyne, MD   1 mg at 02/14/12 1007  . HYDROmorphone (DILAUDID) injection 2 mg  2 mg Intravenous Q2H PRN Gwenyth Bender, MD   2 mg at 02/14/12 2023  . HYDROmorphone (DILAUDID) injection 4 mg  4 mg Intravenous Once Gwenyth Bender, MD      . hydroxyurea (HYDREA) capsule 1,000 mg  1,000 mg Oral Daily Therisa Doyne, MD   1,000 mg at 02/14/12 1010  . hydrOXYzine (ATARAX/VISTARIL) tablet 25-50 mg  25-50 mg Oral Q4H PRN Therisa Doyne, MD   25 mg at 02/14/12 1008  . influenza  inactive virus vaccine (FLUZONE/FLUARIX) injection 0.5 mL  0.5 mL Intramuscular Tomorrow-1000 Therisa Doyne, MD      . morphine 2 MG/ML injection 2-4 mg  2-4 mg Intravenous Q4H PRN Therisa Doyne, MD      . moxifloxacin (AVELOX) tablet 400 mg  400 mg Oral QAC breakfast Gwenyth Bender, MD   400 mg at 02/14/12 1016  . ondansetron (ZOFRAN) tablet 4 mg  4 mg Oral Q4H PRN Therisa Doyne, MD       Or  . ondansetron (ZOFRAN) injection 4 mg  4 mg Intravenous Q4H PRN Therisa Doyne, MD   4 mg at 02/13/12 0044  . oxyCODONE (Oxy IR/ROXICODONE) immediate release tablet 15 mg  15 mg Oral Q4H PRN Therisa Doyne, MD   15 mg at 02/14/12 1209  . pantoprazole (PROTONIX) EC tablet 40 mg  40 mg Oral Q1200 Therisa Doyne, MD   40 mg at 02/14/12 1209  . sodium chloride 0.9 % injection 10-40 mL  10-40 mL Intracatheter PRN Gwenyth Bender, MD      . DISCONTD: 0.9 %  sodium chloride infusion   Intravenous Continuous Therisa Doyne, MD 150 mL/hr at 02/14/12 1620    .  DISCONTD: pantoprazole (PROTONIX) injection 40 mg  40 mg Intravenous Q1200 Therisa Doyne, MD        Objective: Blood pressure 103/67, pulse 113, temperature 99 F (37.2 C), temperature source Oral, resp. rate 18, height 5\' 2"  (1.575 m), weight 194 lb 10.7 oz (88.3 kg), last menstrual period 02/08/2012, SpO2 92.00%.  Well-developed well-nourished black female in no acute distress. HEENT: No sclera icterus. Minimal frontal sinus tenderness bilaterally. NECK: No enlarged thyroid. No posterior cervical nodes. LUNGS: Clear to auscultation. No vocal fremitus. No CVA tenderness. CV: Normal S1, S2 without S3. ABD: No epigastric tenderness. No right upper quadrant tenderness. MSK: Tenderness in right hip region. Negative Homans. No edema. NEURO: Intact.  Lab results: Results for orders placed during the hospital encounter of 02/12/12 (from the past 48 hour(s))  URINALYSIS, ROUTINE W  REFLEX MICROSCOPIC     Status: Normal   Collection Time   02/12/12  8:33 PM      Component Value Range Comment   Color, Urine YELLOW  YELLOW    APPearance CLEAR  CLEAR    Specific Gravity, Urine 1.012  1.005 - 1.030    pH 5.5  5.0 - 8.0    Glucose, UA NEGATIVE  NEGATIVE mg/dL    Hgb urine dipstick NEGATIVE  NEGATIVE    Bilirubin Urine NEGATIVE  NEGATIVE    Ketones, ur NEGATIVE  NEGATIVE mg/dL    Protein, ur NEGATIVE  NEGATIVE mg/dL    Urobilinogen, UA 1.0  0.0 - 1.0 mg/dL    Nitrite NEGATIVE  NEGATIVE    Leukocytes, UA NEGATIVE  NEGATIVE MICROSCOPIC NOT DONE ON URINES WITH NEGATIVE PROTEIN, BLOOD, LEUKOCYTES, NITRITE, OR GLUCOSE <1000 mg/dL.  POCT PREGNANCY, URINE     Status: Normal   Collection Time   02/12/12  8:37 PM      Component Value Range Comment   Preg Test, Ur NEGATIVE  NEGATIVE   CBC WITH DIFFERENTIAL     Status: Abnormal   Collection Time   02/12/12  9:06 PM      Component Value Range Comment   WBC 11.8 (*) 4.0 - 10.5 K/uL    RBC 2.94 (*) 3.87 - 5.11 MIL/uL    Hemoglobin 9.5 (*) 12.0 - 15.0 g/dL    HCT 16.1 (*) 09.6 - 46.0 %    MCV 90.8  78.0 - 100.0 fL    MCH 32.3  26.0 - 34.0 pg    MCHC 35.6  30.0 - 36.0 g/dL    RDW 04.5 (*) 40.9 - 15.5 %    Platelets 441 (*) 150 - 400 K/uL    Neutrophils Relative 62  43 - 77 %    Neutro Abs 7.3  1.7 - 7.7 K/uL    Lymphocytes Relative 23  12 - 46 %    Lymphs Abs 2.8  0.7 - 4.0 K/uL    Monocytes Relative 11  3 - 12 %    Monocytes Absolute 1.3 (*) 0.1 - 1.0 K/uL    Eosinophils Relative 3  0 - 5 %    Eosinophils Absolute 0.3  0.0 - 0.7 K/uL    Basophils Relative 1  0 - 1 %    Basophils Absolute 0.1  0.0 - 0.1 K/uL   COMPREHENSIVE METABOLIC PANEL     Status: Abnormal   Collection Time   02/12/12  9:06 PM      Component Value Range Comment   Sodium 137  135 - 145 mEq/L    Potassium 3.7  3.5 -  5.1 mEq/L    Chloride 103  96 - 112 mEq/L    CO2 26  19 - 32 mEq/L    Glucose, Bld 95  70 - 99 mg/dL    BUN 5 (*) 6 - 23 mg/dL     Creatinine, Ser 1.47  0.50 - 1.10 mg/dL    Calcium 9.3  8.4 - 82.9 mg/dL    Total Protein 7.4  6.0 - 8.3 g/dL    Albumin 3.9  3.5 - 5.2 g/dL    AST 22  0 - 37 U/L    ALT 17  0 - 35 U/L    Alkaline Phosphatase 68  39 - 117 U/L    Total Bilirubin 1.7 (*) 0.3 - 1.2 mg/dL    GFR calc non Af Amer >90  >90 mL/min    GFR calc Af Amer >90  >90 mL/min   CBC     Status: Abnormal   Collection Time   02/13/12  8:23 AM      Component Value Range Comment   WBC 15.7 (*) 4.0 - 10.5 K/uL WHITE COUNT CONFIRMED ON SMEAR   RBC 2.55 (*) 3.87 - 5.11 MIL/uL    Hemoglobin 8.2 (*) 12.0 - 15.0 g/dL    HCT 56.2 (*) 13.0 - 46.0 %    MCV 91.4  78.0 - 100.0 fL    MCH 32.2  26.0 - 34.0 pg    MCHC 35.2  30.0 - 36.0 g/dL    RDW 86.5 (*) 78.4 - 15.5 %    Platelets 390  150 - 400 K/uL PLATELET COUNT CONFIRMED BY SMEAR  CREATININE, SERUM     Status: Normal   Collection Time   02/13/12  8:23 AM      Component Value Range Comment   Creatinine, Ser 0.60  0.50 - 1.10 mg/dL    GFR calc non Af Amer >90  >90 mL/min    GFR calc Af Amer >90  >90 mL/min     Studies/Results: Dg Abd 1 View  02/13/2012  *RADIOLOGY REPORT*  Clinical Data: Lower abdominal pain  ABDOMEN - 1 VIEW  Comparison: 06/22/2011  Findings: There is a moderate amount of fecal matter in the colon. The gas pattern is otherwise unremarkable.  No sign of ileus, obstruction, soft tissue calcification or significant bony finding. Biconcave vertebral bodies consistent with sickle cell.  The There are clips in the right upper quadrant consistent with previous cholecystectomy.  IMPRESSION: Moderate amount of fecal matter in the colon.  Otherwise unremarkable.   Original Report Authenticated By: Thomasenia Sales, M.D.    Ct Head Wo Contrast  02/13/2012  *RADIOLOGY REPORT*  Clinical Data: Headache.  CT HEAD WITHOUT CONTRAST  Technique:  Contiguous axial images were obtained from the base of the skull through the vertex without contrast.  Comparison: April 17, 2011.   Findings: Bony calvarium is intact.  No mass effect or midline shift is noted.  Ventricular size is within normal limits.  There is no evidence of mass, hemorrhage or infarction.  IMPRESSION: No gross intracranial abnormality seen.   Original Report Authenticated By: Venita Sheffield., M.D.     Patient Active Problem List  Diagnosis  . SICKLE CELL ANEMIA  . TRICHOTILLOMANIA  . Active smoker  . Back pain  . Depression  . GERD (gastroesophageal reflux disease)  . Contraception management  . Stress  . Overweight  . Vaso-occlusive sickle cell crisis  . Right thigh pain  . Vaginosis  . Tachycardia  .  Headache  . Nausea & vomiting    Impression: Sickle cell crisis. Slowly resolving. Migraine headaches, rule out. Allergic rhinitis with probable sinusitis. Anxiety disorder. 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   Plan: Adjust IV fluids to hypotonic saline a decreased rate. Incentive spirometry. Continue by mouth Avelox. Trial of Imitrex for her headaches. Transition to home some. Patient feels she may be able to manage her pain by tomorrow.   August Saucer, Fahd Galea 02/14/2012 8:28 PM

## 2012-02-15 NOTE — Progress Notes (Signed)
Subjective:  Patient was originally planned to be discharged today. 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 Later this evening she rated her pain as a 6/10. Denies chest pain or shortness of breath. She continues to have headaches which associated with migraine headache. She has ongoing right hip pain. No new complaints. Her boyfriend is in the room tonight.   Allergies  Allergen Reactions  . Latex Rash  . Toradol (Ketorolac Tromethamine) Hives and Itching   Current Facility-Administered Medications  Medication Dose Route Frequency Provider Last Rate Last Dose  . 0.45 % NaCl with KCl 20 mEq / L infusion   Intravenous Continuous Gwenyth Bender, MD 75 mL/hr at 02/15/12 2139    . acetaminophen (TYLENOL) tablet 650 mg  650 mg Oral Q4H PRN Therisa Doyne, MD       Or  . acetaminophen (TYLENOL) suppository 650 mg  650 mg Rectal Q4H PRN Therisa Doyne, MD      . ALPRAZolam Prudy Feeler) tablet 0.25-0.5 mg  0.25-0.5 mg Oral TID Therisa Doyne, MD   0.5 mg at 02/15/12 2139  . antiseptic oral rinse (BIOTENE) solution 15 mL  15 mL Mouth Rinse q12n4p Anastassia Doutova, MD   15 mL at 02/15/12 1600  . chlorhexidine (PERIDEX) 0.12 % solution 15 mL  15 mL Mouth Rinse BID Therisa Doyne, MD   15 mL at 02/15/12 0849  . docusate sodium (COLACE) capsule 100 mg  100 mg Oral BID Therisa Doyne, MD   100 mg at 02/15/12 2139  . enoxaparin (LOVENOX) injection 40 mg  40 mg Subcutaneous Q24H Therisa Doyne, MD   40 mg at 02/15/12 1525  . folic acid (FOLVITE) tablet 1 mg  1 mg Oral Daily Therisa Doyne, MD   1 mg at 02/15/12 1018  . HYDROmorphone (DILAUDID) injection 2 mg  2 mg Intravenous Q2H PRN Gwenyth Bender, MD   2 mg at 02/15/12 2139  . HYDROmorphone (DILAUDID) injection 4 mg  4 mg Intravenous Once Gwenyth Bender, MD      . hydroxyurea (HYDREA) capsule 1,000 mg  1,000 mg Oral Daily Therisa Doyne, MD   1,000 mg at 02/15/12 1018  . hydrOXYzine  (ATARAX/VISTARIL) tablet 25-50 mg  25-50 mg Oral Q4H PRN Therisa Doyne, MD   25 mg at 02/14/12 1008  . influenza  inactive virus vaccine (FLUZONE/FLUARIX) injection 0.5 mL  0.5 mL Intramuscular Tomorrow-1000 Therisa Doyne, MD      . morphine 2 MG/ML injection 2-4 mg  2-4 mg Intravenous Q4H PRN Therisa Doyne, MD   4 mg at 02/15/12 1018  . moxifloxacin (AVELOX) tablet 400 mg  400 mg Oral QAC breakfast Gwenyth Bender, MD   400 mg at 02/15/12 0800  . ondansetron (ZOFRAN) tablet 4 mg  4 mg Oral Q4H PRN Therisa Doyne, MD       Or  . ondansetron (ZOFRAN) injection 4 mg  4 mg Intravenous Q4H PRN Therisa Doyne, MD   4 mg at 02/13/12 0044  . oxyCODONE (Oxy IR/ROXICODONE) immediate release tablet 15 mg  15 mg Oral Q4H PRN Therisa Doyne, MD   15 mg at 02/14/12 1209  . pantoprazole (PROTONIX) EC tablet 40 mg  40 mg Oral Q1200 Therisa Doyne, MD   40 mg at 02/15/12 1203  . sodium chloride 0.9 % injection 10-40 mL  10-40 mL Intracatheter PRN Gwenyth Bender, MD   10 mL at 02/15/12 1118  . SUMAtriptan (IMITREX) tablet 50 mg  50 mg Oral Q2H PRN Gwenyth Bender, MD        Objective: Blood pressure 104/69, pulse 96, temperature 98.8 F (37.1 C), temperature source Oral, resp. rate 16, height 5\' 2"  (1.575 m), weight 194 lb 10.7 oz (88.3 kg), last menstrual period 02/08/2012, SpO2 95.00%.  Well-developed overweight black female in no acute distress. Somewhat anxious. HEENT:no sinus tenderness. No sclera icterus. NECK:no enlarged thyroid. LUNGS:clear to auscultation. NW:GNFAOZ S1, S2 without S3. HYQ:MVHQ, nontender. MSK:in the right hip. No increased warmth. Negative Homans. NEURO:nonfocal.  Lab results: No results found for this or any previous visit (from the past 48 hour(s)).  Studies/Results: No results found.  Patient Active Problem List  Diagnosis  . SICKLE CELL ANEMIA  . TRICHOTILLOMANIA  . Active smoker  . Back pain  . Depression  . GERD (gastroesophageal reflux  disease)  . Contraception management  . Stress  . Overweight  . Vaso-occlusive sickle cell crisis  . Right thigh pain  . Vaginosis  . Tachycardia  . Headache  . Nausea & vomiting    Impression: Resolving sickle cell crisis. Presently active hemolysis. Anxiety disorder with situational stress. Mood fluctuates. 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 Back pain. Trichotillomania. Headaches rule out migraine variant. She did not respond appropriately to Imitrex.   Plan: Taper IV medications as tolerated. Anticipate discharge tomorrow. Patient to arrange appropriate transportation.   August Saucer, Riggs Dineen 02/15/2012 11:03 PM

## 2012-02-16 ENCOUNTER — Inpatient Hospital Stay (HOSPITAL_COMMUNITY): Payer: Medicaid Other

## 2012-02-16 LAB — CBC WITH DIFFERENTIAL/PLATELET
Band Neutrophils: 0 % (ref 0–10)
Basophils Absolute: 0 10*3/uL (ref 0.0–0.1)
Basophils Relative: 0 % (ref 0–1)
HCT: 23.2 % — ABNORMAL LOW (ref 36.0–46.0)
Hemoglobin: 8.1 g/dL — ABNORMAL LOW (ref 12.0–15.0)
Lymphocytes Relative: 17 % (ref 12–46)
Lymphs Abs: 2.7 10*3/uL (ref 0.7–4.0)
MCHC: 34.9 g/dL (ref 30.0–36.0)
MCV: 94.7 fL (ref 78.0–100.0)
Metamyelocytes Relative: 0 %
Monocytes Absolute: 1.4 10*3/uL — ABNORMAL HIGH (ref 0.1–1.0)
Monocytes Relative: 9 % (ref 3–12)
Promyelocytes Absolute: 0 %

## 2012-02-16 LAB — COMPREHENSIVE METABOLIC PANEL
ALT: 22 U/L (ref 0–35)
AST: 30 U/L (ref 0–37)
Albumin: 3.6 g/dL (ref 3.5–5.2)
Calcium: 9.2 mg/dL (ref 8.4–10.5)
Creatinine, Ser: 0.62 mg/dL (ref 0.50–1.10)
Sodium: 139 mEq/L (ref 135–145)

## 2012-02-16 MED ORDER — HYDROMORPHONE HCL PF 1 MG/ML IJ SOLN
2.0000 mg | Freq: Once | INTRAMUSCULAR | Status: AC
  Administered 2012-02-16: 2 mg via INTRAVENOUS

## 2012-02-16 MED ORDER — MOXIFLOXACIN HCL 400 MG PO TABS: 400.0000 mg | ORAL_TABLET | Freq: Every day | ORAL | Status: DC

## 2012-02-16 MED ORDER — ONDANSETRON HCL 4 MG PO TABS: 4.0000 mg | ORAL_TABLET | ORAL | Status: DC | PRN

## 2012-02-16 MED ORDER — PANTOPRAZOLE SODIUM 40 MG PO TBEC: 40.0000 mg | DELAYED_RELEASE_TABLET | Freq: Every day | ORAL | Status: DC

## 2012-02-16 MED ORDER — OXYCODONE HCL 15 MG PO TABS: 15.0000 mg | ORAL_TABLET | ORAL | Status: DC | PRN

## 2012-02-16 MED ORDER — HYDROMORPHONE HCL PF 1 MG/ML IJ SOLN
2.0000 mg | INTRAMUSCULAR | Status: DC | PRN
  Administered 2012-02-16: 2 mg via INTRAVENOUS
  Filled 2012-02-16 (×2): qty 2

## 2012-02-16 NOTE — Progress Notes (Signed)
Pt. discharged to home. Pt after visit summary reviewed and pt capable of re verbalizing medications and follow up appointments. Pt remains stable. No signs and symptoms of distress. Educated to return to ER in the event of SOB, dizziness, chest pain, or fainting. PICC line removed by IV team. Pt taken to short stay via wheelchair. Makenlee Mckeag E

## 2012-02-16 NOTE — Care Management Note (Signed)
    Page 1 of 1   02/17/2012     12:28:34 PM   CARE MANAGEMENT NOTE 02/17/2012  Patient:  [name], [name]   Account Number:  [id number]  Date Initiated:  02/16/2012  Documentation initiated by:  Letha Cape  Subjective/Objective Assessment:   dx ss crisis  admit- lives with spouse.  pta independent.     Action/Plan:   Anticipated DC Date:  02/16/2012   Anticipated DC Plan:  HOME/SELF CARE      DC Planning Services  CM consult      Choice offered to / List presented to:             Status of service:  Completed, signed off Medicare Important Message given?   (If response is "NO", the following Medicare IM given date fields will be blank) Date Medicare IM given:   Date Additional Medicare IM given:    Discharge Disposition:  HOME/SELF CARE  Per UR Regulation:  Reviewed for med. necessity/level of care/duration of stay  If discussed at Long Length of Stay Meetings, dates discussed:    Comments:  02/17/12 12:27 Letha Cape RN, BSN [phone] patient dc to home, no needs identified.  02/16/12 11:46 Letha Cape RN, BSN [phone] patient lives with spouse, pta independent, tapering iv medications, pt for possible dc today.

## 2012-02-16 NOTE — Progress Notes (Signed)
Notified Dr. August Saucer that patient having pain in jaw and back. Gave Dilaudid 2mg  IV at 0425 and Morphine 4mg  IV at 0521. Patient states that it didn't help. Dr. August Saucer gave verbal order for Dilaudid 2mg  IV x1 now. Will continue to monitor patient. Jerrye Bushy

## 2012-02-16 NOTE — Progress Notes (Signed)
1345 Tech notified RN that pt was "unresponsive" RN entered room pt sitting up in bed with head lowered eyes closed, opens eyes lifts head with prompting. States left sided headache radiating down back. Neuro checks negative, VSS. MD called new orders received. Will continue to monitor.

## 2012-03-01 IMAGING — CR DG CHEST 2V
2 series · 2 of 2 positions shown · non-contrast
Comparison: 03/31/2011

CLINICAL DATA: Chest pain

CHEST - 2 VIEW

[w chest lat]
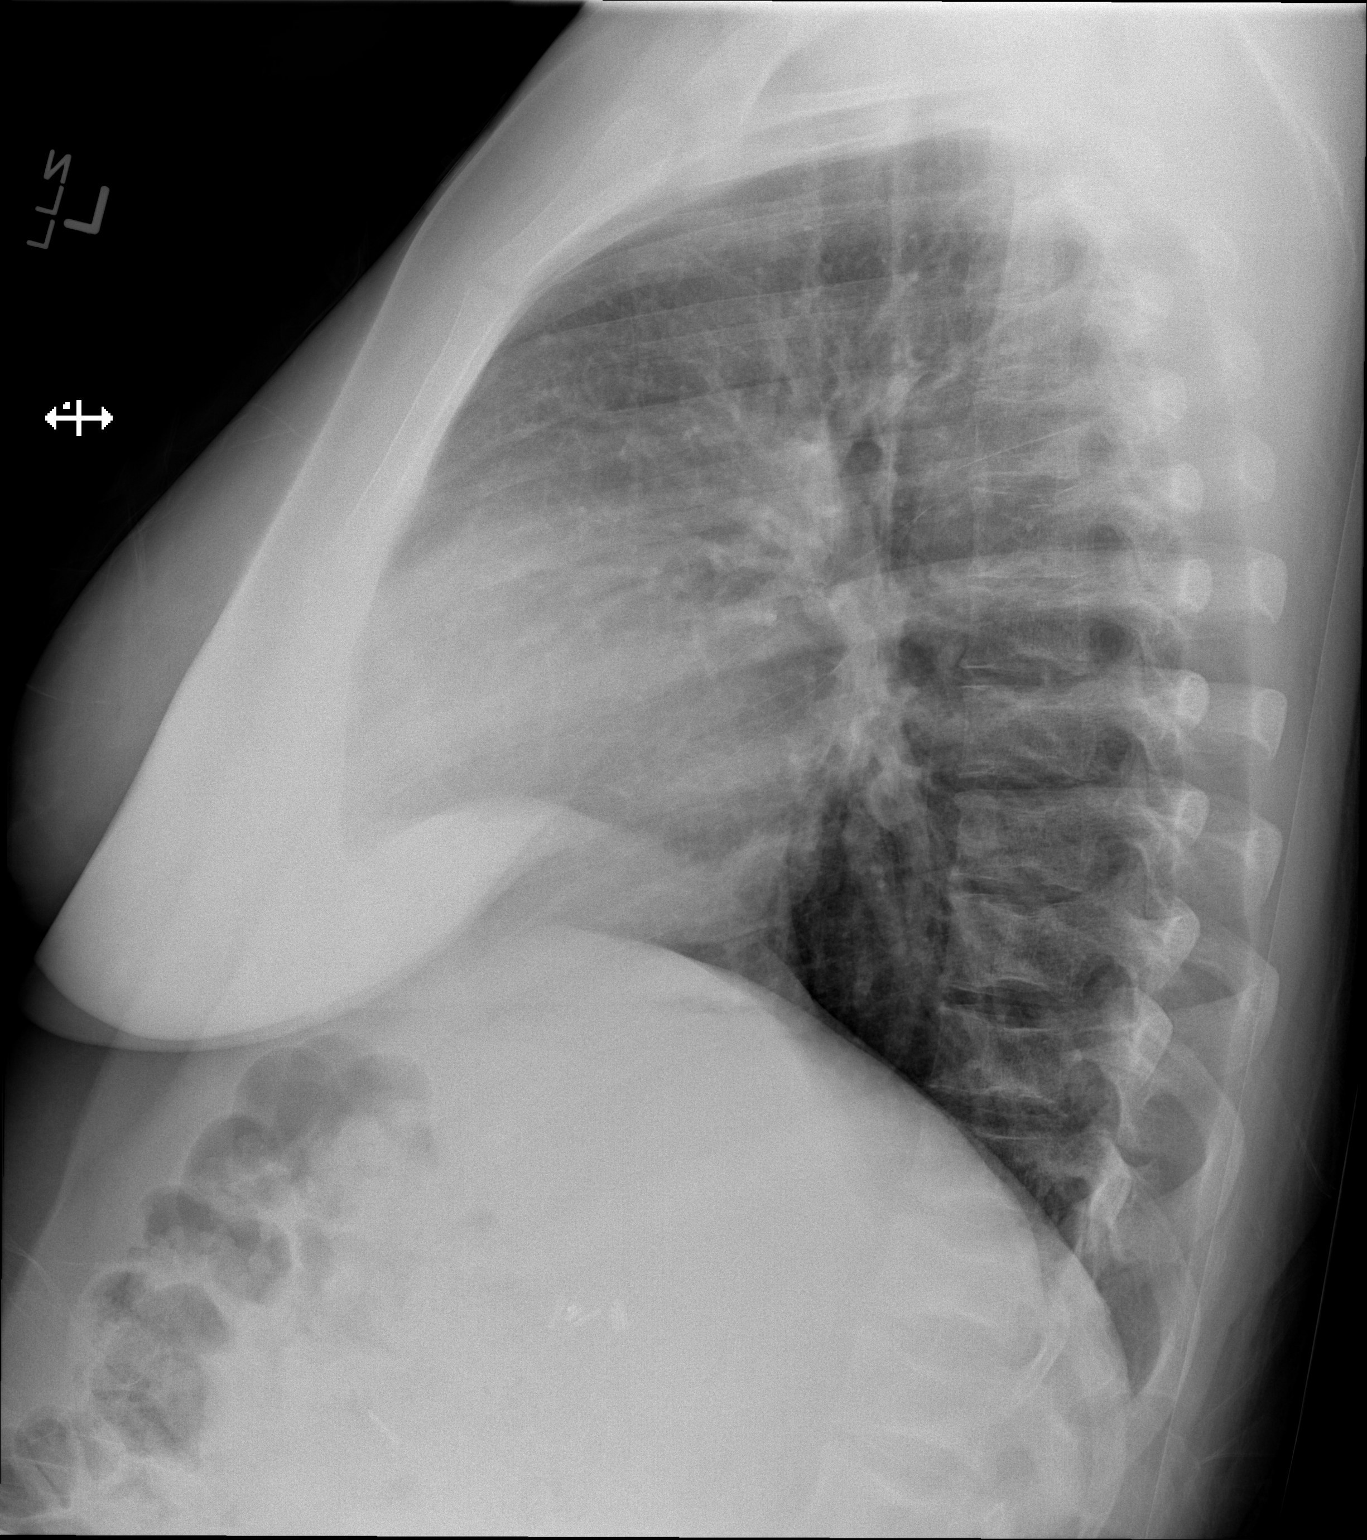

[x chest ap]
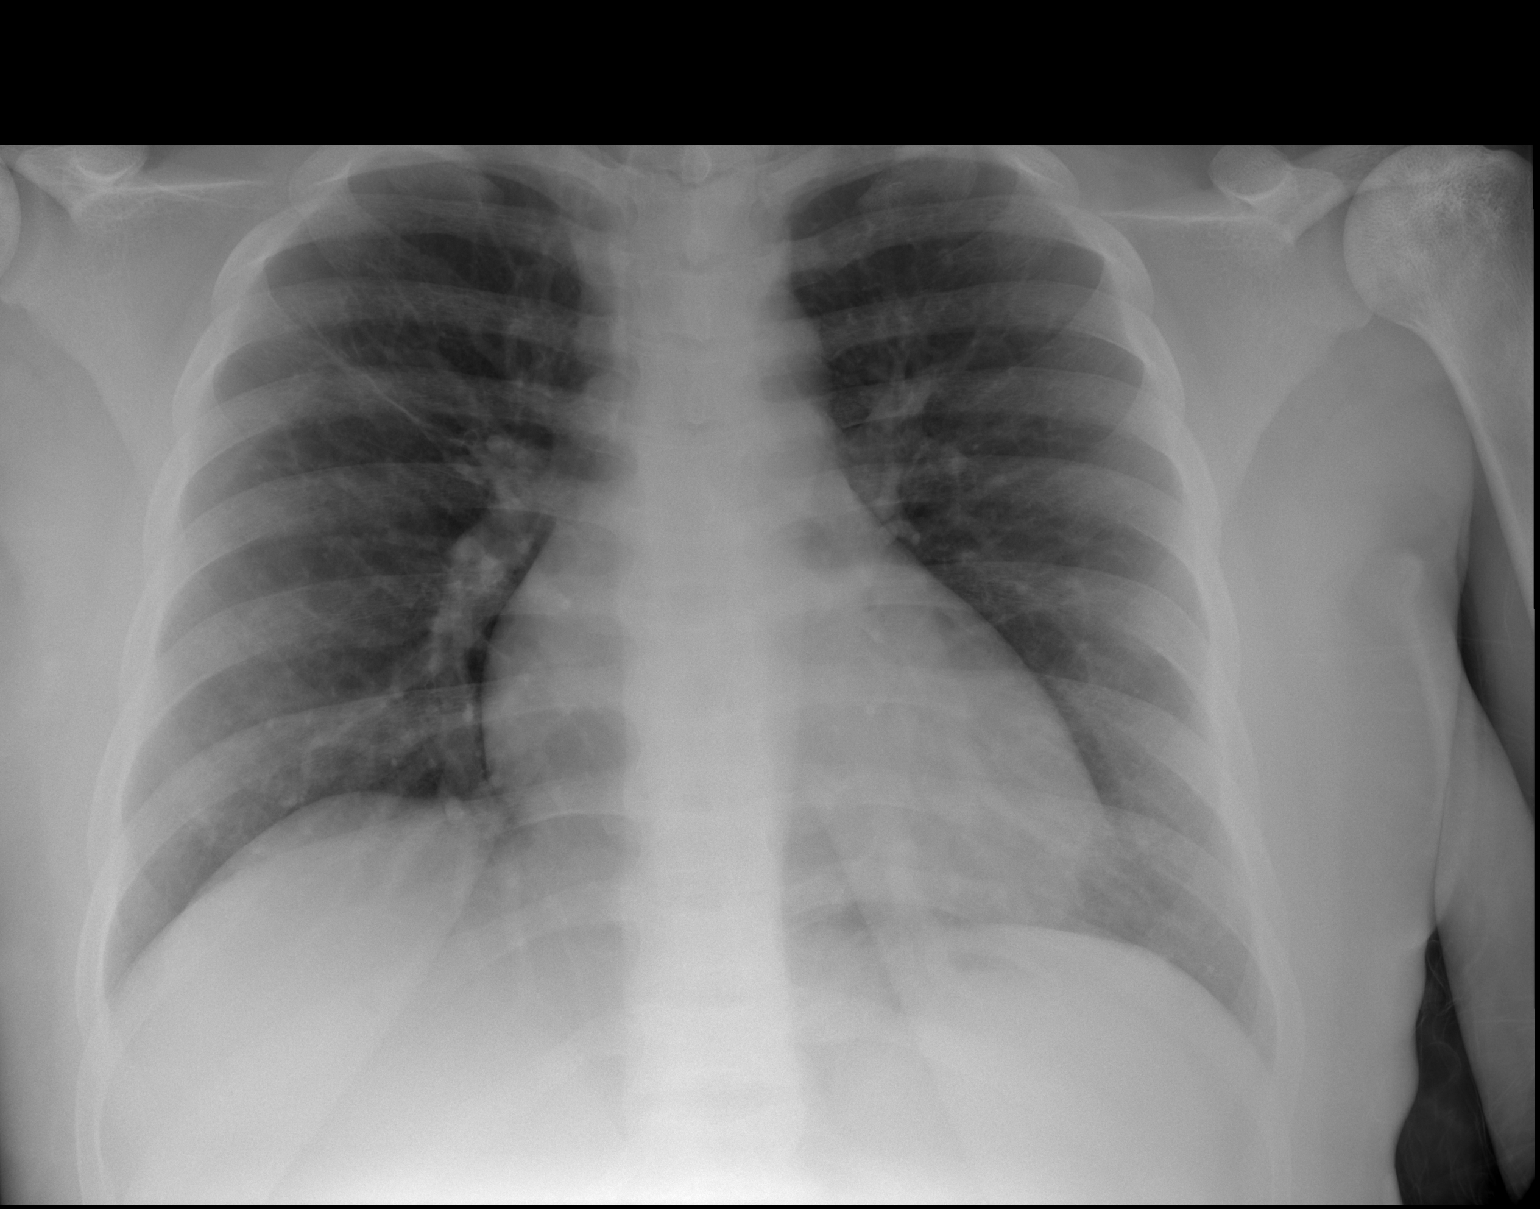

[2 of 2 positions shown; findings below may reference images not displayed]

FINDINGS: Heart is upper limits normal in size.  Lungs are clear.
No effusions.  No acute bony abnormality.  Mild peribronchial
thickening.  Right upper lobe atelectasis or scarring.
IMPRESSION: Suspect bronchitic changes.

## 2012-03-03 ENCOUNTER — Encounter (HOSPITAL_COMMUNITY): Payer: Self-pay

## 2012-03-03 ENCOUNTER — Inpatient Hospital Stay (HOSPITAL_COMMUNITY)
Admission: EM | Admit: 2012-03-03 | Discharge: 2012-03-15 | DRG: 812 | Disposition: A | Discharge status: Home / Self Care | Payer: Medicaid Other | Attending: Internal Medicine | Admitting: Internal Medicine

## 2012-03-03 DIAGNOSIS — D5700 Hb-SS disease with crisis, unspecified: Secondary | ICD-10-CM

## 2012-03-03 DIAGNOSIS — K219 Gastro-esophageal reflux disease without esophagitis: Secondary | ICD-10-CM | POA: Diagnosis present

## 2012-03-03 DIAGNOSIS — F329 Major depressive disorder, single episode, unspecified: Secondary | ICD-10-CM | POA: Diagnosis present

## 2012-03-03 DIAGNOSIS — M545 Low back pain, unspecified: Secondary | ICD-10-CM | POA: Diagnosis present

## 2012-03-03 DIAGNOSIS — R000 Tachycardia, unspecified: Secondary | ICD-10-CM | POA: Diagnosis present

## 2012-03-03 DIAGNOSIS — Z79899 Other long term (current) drug therapy: Secondary | ICD-10-CM

## 2012-03-03 DIAGNOSIS — M549 Dorsalgia, unspecified: Secondary | ICD-10-CM

## 2012-03-03 DIAGNOSIS — E876 Hypokalemia: Secondary | ICD-10-CM | POA: Diagnosis present

## 2012-03-03 DIAGNOSIS — F6389 Other impulse disorders: Secondary | ICD-10-CM | POA: Diagnosis present

## 2012-03-03 DIAGNOSIS — F411 Generalized anxiety disorder: Secondary | ICD-10-CM | POA: Diagnosis present

## 2012-03-03 DIAGNOSIS — E663 Overweight: Secondary | ICD-10-CM | POA: Diagnosis present

## 2012-03-03 DIAGNOSIS — F3289 Other specified depressive episodes: Secondary | ICD-10-CM | POA: Diagnosis present

## 2012-03-03 DIAGNOSIS — D72829 Elevated white blood cell count, unspecified: Secondary | ICD-10-CM | POA: Diagnosis present

## 2012-03-03 DIAGNOSIS — F17200 Nicotine dependence, unspecified, uncomplicated: Secondary | ICD-10-CM

## 2012-03-03 DIAGNOSIS — G894 Chronic pain syndrome: Secondary | ICD-10-CM | POA: Diagnosis present

## 2012-03-03 DIAGNOSIS — F430 Acute stress reaction: Secondary | ICD-10-CM | POA: Diagnosis present

## 2012-03-03 LAB — CBC WITH DIFFERENTIAL/PLATELET
Basophils Absolute: 0.1 10*3/uL (ref 0.0–0.1)
Eosinophils Relative: 5 % (ref 0–5)
Lymphocytes Relative: 24 % (ref 12–46)
MCV: 88.4 fL (ref 78.0–100.0)
Neutrophils Relative %: 61 % (ref 43–77)
Platelets: 473 10*3/uL — ABNORMAL HIGH (ref 150–400)
RDW: 16.9 % — ABNORMAL HIGH (ref 11.5–15.5)
WBC: 16 10*3/uL — ABNORMAL HIGH (ref 4.0–10.5)

## 2012-03-03 LAB — COMPREHENSIVE METABOLIC PANEL
Alkaline Phosphatase: 60 U/L (ref 39–117)
BUN: 9 mg/dL (ref 6–23)
CO2: 24 mEq/L (ref 19–32)
Chloride: 104 mEq/L (ref 96–112)
GFR calc Af Amer: >90 mL/min (ref >90)
GFR calc non Af Amer: >90 mL/min (ref >90)
Glucose, Bld: 91 mg/dL (ref 70–99)
Potassium: 3.5 mEq/L (ref 3.5–5.1)
Total Bilirubin: 2.3 mg/dL — ABNORMAL HIGH (ref 0.3–1.2)

## 2012-03-03 LAB — URINALYSIS, ROUTINE W REFLEX MICROSCOPIC
Glucose, UA: NEGATIVE mg/dL
Hgb urine dipstick: NEGATIVE
Ketones, ur: NEGATIVE mg/dL
Leukocytes, UA: NEGATIVE
Protein, ur: NEGATIVE mg/dL

## 2012-03-03 LAB — RETICULOCYTES: Retic Ct Pct: 12.9 % — ABNORMAL HIGH (ref 0.4–3.1)

## 2012-03-03 MED ORDER — HYDROMORPHONE HCL PF 2 MG/ML IJ SOLN
4.0000 mg | INTRAMUSCULAR | Status: DC | PRN
  Administered 2012-03-03 (×2): 4 mg via INTRAVENOUS
  Administered 2012-03-04: 2 mg via INTRAVENOUS
  Administered 2012-03-04: 4 mg via INTRAVENOUS
  Filled 2012-03-03: qty 1
  Filled 2012-03-03 (×3): qty 2

## 2012-03-03 MED ORDER — FOLIC ACID 1 MG PO TABS: 1.0000 mg | ORAL_TABLET | Freq: Every day | ORAL | Status: DC

## 2012-03-03 MED ORDER — SODIUM CHLORIDE 0.9 % IV SOLN
250.0000 mL | INTRAVENOUS | Status: DC | PRN
  Administered 2012-03-03: 250 mL via INTRAVENOUS

## 2012-03-03 MED ORDER — ONDANSETRON HCL 4 MG/2ML IJ SOLN
4.0000 mg | Freq: Once | INTRAMUSCULAR | Status: AC
  Administered 2012-03-03: 4 mg via INTRAVENOUS
  Filled 2012-03-03: qty 2

## 2012-03-03 MED ORDER — HYDROMORPHONE HCL PF 2 MG/ML IJ SOLN
3.0000 mg | INTRAMUSCULAR | Status: DC | PRN
  Administered 2012-03-03: 3 mg via INTRAVENOUS
  Filled 2012-03-03: qty 2

## 2012-03-03 NOTE — ED Notes (Signed)
Information given to IV team

## 2012-03-03 NOTE — ED Provider Notes (Signed)
History     CSN: [id number]  Arrival date & time 03/03/12  1758   First MD Initiated Contact with Patient 03/03/12 1803      Chief Complaint  Patient presents with  . Back Pain    (Consider location/radiation/quality/duration/timing/severity/associated sxs/prior treatment) HPI Comments: [name] 21 y.o. female   The chief complaint is: Patient presents with:   Back Pain  sickle cell pain crisis    21 year old female with history of sickle cell anemia presents to mced the chief complaint of sickle cell pain crisis. Patient had migraine. Last night and ran out of her normal migraine medications she has been under increased stress at school with a heavy workload and decreased sleep. Patient complains of low back pain and left elbow pain. States that this is the same as her usual sickle cell crisis. She is followed by Dr. August Saucer. She's been taking her hydroxyurea as directed. She denies any chest pain or shortness of breath. She denies any abdominal pain. Denies fevers, chills, myalgias, arthralgias, nausea, vomiting, diarrhea. Denies urinary or vaginal sxs.    The history is provided by the patient and medical records. No language interpreter was used.    Past Medical History  Diagnosis Date  . H/O: 1 miscarriage 03/22/2011  . 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 Depression 01/06/2011  . GERD (gastroesophageal reflux disease) 02/17/2011  . Trichotillomania     h/o  . Blood transfusion     "lots"  . Sickle cell anemia with crisis   . Exertional dyspnea     "sometimes"  . Sickle cell anemia   . Headache   . Migraines 11/08/11    "@ least twice/month"  . Chronic back pain     "very severe; have knot in my back; from tight muscle; take RX and exercise for it"  . Mood swings 11/08/11    "I go back and forth; real bad"    Past Surgical History  Procedure Date  . Cholecystectomy 05/2010  . Dilation and curettage of uterus 02/20/11    S/P miscarriage    Family History    Problem Relation Age of Onset  . Diabetes Mother     History  Substance Use Topics  . Smoking status: Never Smoker   . Smokeless tobacco: Never Used  . Alcohol Use: No     pt states she quit in May    OB History    Grav Para Term Preterm Abortions TAB SAB Ect Mult Living   1    1           Review of Systems  Constitutional: Negative for fever and chills.  HENT: Negative for trouble swallowing.   Respiratory: Negative for shortness of breath.   Cardiovascular: Negative for chest pain.  Gastrointestinal: Negative for nausea, vomiting, abdominal pain, diarrhea and constipation.  Genitourinary: Negative for dysuria and hematuria.  Musculoskeletal: Positive for back pain. Negative for myalgias.  Skin: Negative for rash.  Neurological: Negative for numbness and headaches.  All other systems reviewed and are negative.    Allergies  Carrot oil; Latex; and Toradol  Home Medications   Current Outpatient Rx  Name Route Sig Dispense Refill  . HYDROXYUREA 500 MG PO CAPS Oral Take 1,000 mg by mouth daily. May take with food to minimize GI side effects.    . OXYCODONE HCL 15 MG PO TABS Oral Take 1 tablet (15 mg total) by mouth every 4 (four) hours as needed. For pain 50 tablet 0  . PANTOPRAZOLE  SODIUM 40 MG PO TBEC Oral Take 1 tablet (40 mg total) by mouth daily at 12 noon. 30 tablet 0    BP 108/68  Pulse 87  Temp 99.2 F (37.3 C) (Oral)  Resp 16  SpO2 100%  LMP 02/08/2012  Physical Exam  Nursing note and vitals reviewed. Constitutional: She is oriented to person, place, and time. She appears well-developed and well-nourished. No distress (patient appears very uncomfortable).  HENT:  Head: Normocephalic and atraumatic.  Eyes: Conjunctivae normal are normal. No scleral icterus.  Neck: Normal range of motion.  Cardiovascular: Normal rate, regular rhythm and normal heart sounds.  Exam reveals no gallop and no friction rub.   No murmur heard. Pulmonary/Chest: Effort normal  and breath sounds normal. No respiratory distress.  Abdominal: Soft. Bowel sounds are normal. She exhibits no distension and no mass. There is no tenderness. There is no guarding.  Musculoskeletal: She exhibits no edema.  Neurological: She is alert and oriented to person, place, and time.  Skin: Skin is warm and dry. She is not diaphoretic.    ED Course  Procedures (including critical care time)   Results for orders placed during the hospital encounter of 03/03/12  COMPREHENSIVE METABOLIC PANEL      Component Value Range   Sodium 138  135 - 145 mEq/L   Potassium 3.5  3.5 - 5.1 mEq/L   Chloride 104  96 - 112 mEq/L   CO2 24  19 - 32 mEq/L   Glucose, Bld 91  70 - 99 mg/dL   BUN 9  6 - 23 mg/dL   Creatinine, Ser 5.78  0.50 - 1.10 mg/dL   Calcium 9.2  8.4 - 46.9 mg/dL   Total Protein 7.6  6.0 - 8.3 g/dL   Albumin 4.1  3.5 - 5.2 g/dL   AST 20  0 - 37 U/L   ALT 10  0 - 35 U/L   Alkaline Phosphatase 60  39 - 117 U/L   Total Bilirubin 2.3 (*) 0.3 - 1.2 mg/dL   GFR calc non Af Amer >90  >90 mL/min   GFR calc Af Amer >90  >90 mL/min  CBC WITH DIFFERENTIAL      Component Value Range   WBC 16.0 (*) 4.0 - 10.5 K/uL   RBC 2.94 (*) 3.87 - 5.11 MIL/uL   Hemoglobin 9.2 (*) 12.0 - 15.0 g/dL   HCT 62.9 (*) 52.8 - 41.3 %   MCV 88.4  78.0 - 100.0 fL   MCH 31.3  26.0 - 34.0 pg   MCHC 35.4  30.0 - 36.0 g/dL   RDW 24.4 (*) 01.0 - 27.2 %   Platelets 473 (*) 150 - 400 K/uL   Neutrophils Relative 61  43 - 77 %   Neutro Abs 9.8 (*) 1.7 - 7.7 K/uL   Lymphocytes Relative 24  12 - 46 %   Lymphs Abs 3.8  0.7 - 4.0 K/uL   Monocytes Relative 10  3 - 12 %   Monocytes Absolute 1.6 (*) 0.1 - 1.0 K/uL   Eosinophils Relative 5  0 - 5 %   Eosinophils Absolute 0.7  0.0 - 0.7 K/uL   Basophils Relative 1  0 - 1 %   Basophils Absolute 0.1  0.0 - 0.1 K/uL  RETICULOCYTES      Component Value Range   Retic Ct Pct 12.9 (*) 0.4 - 3.1 %   RBC. 2.94 (*) 3.87 - 5.11 MIL/uL   Retic Count, Manual 379.3 (*) 19.0 -  186.0 K/uL  URINALYSIS, ROUTINE W REFLEX MICROSCOPIC      Component Value Range   Color, Urine YELLOW  YELLOW   APPearance CLEAR  CLEAR   Specific Gravity, Urine 1.013  1.005 - 1.030   pH 8.5 (*) 5.0 - 8.0   Glucose, UA NEGATIVE  NEGATIVE mg/dL   Hgb urine dipstick NEGATIVE  NEGATIVE   Bilirubin Urine NEGATIVE  NEGATIVE   Ketones, ur NEGATIVE  NEGATIVE mg/dL   Protein, ur NEGATIVE  NEGATIVE mg/dL   Urobilinogen, UA 1.0  0.0 - 1.0 mg/dL   Nitrite NEGATIVE  NEGATIVE   Leukocytes, UA NEGATIVE  NEGATIVE  POCT PREGNANCY, URINE      Component Value Range   Preg Test, Ur NEGATIVE  NEGATIVE    Labs Reviewed - No data to display No results found.   No diagnosis found.    MDM  Patient has been waiting for an extended time of IV team to get access.     Patient labs consistent with previous no signs of acute chest syndrome or acute crisis.  Patient continues to have severe pain despite high doses of dilaudid.  I have spoken with Dr. Conley Rolls who has agreed to admit the patient.  Patient has agree to the plan of care.          Arthor Captain, PA-C 03/08/12 1335

## 2012-03-03 NOTE — ED Notes (Signed)
Had a migraine headache last night. Ran out of medications. Not getting enough sleep because of school and thinks she is in more pain because of it. Turned into a back ache this morning.

## 2012-03-04 ENCOUNTER — Encounter (HOSPITAL_COMMUNITY): Payer: Self-pay | Admitting: Internal Medicine

## 2012-03-04 DIAGNOSIS — M549 Dorsalgia, unspecified: Secondary | ICD-10-CM

## 2012-03-04 DIAGNOSIS — F17200 Nicotine dependence, unspecified, uncomplicated: Secondary | ICD-10-CM

## 2012-03-04 DIAGNOSIS — D5700 Hb-SS disease with crisis, unspecified: Principal | ICD-10-CM

## 2012-03-04 LAB — CBC
HCT: 23.2 % — ABNORMAL LOW (ref 36.0–46.0)
MCH: 30.7 pg (ref 26.0–34.0)
MCHC: 34.9 g/dL (ref 30.0–36.0)
MCV: 87.9 fL (ref 78.0–100.0)
Platelets: 442 10*3/uL — ABNORMAL HIGH (ref 150–400)
RDW: 17.4 % — ABNORMAL HIGH (ref 11.5–15.5)

## 2012-03-04 LAB — CREATININE, SERUM: GFR calc Af Amer: >90 mL/min (ref >90)

## 2012-03-04 IMAGING — CR DG CHEST 2V
2 series · 2 of 2 positions shown · non-contrast
Comparison: 04/17/2011 and 05/16/2010.

CLINICAL DATA: Chest and upper back pain.  Sickle cell crisis.

CHEST - 2 VIEW

[w chest pa]
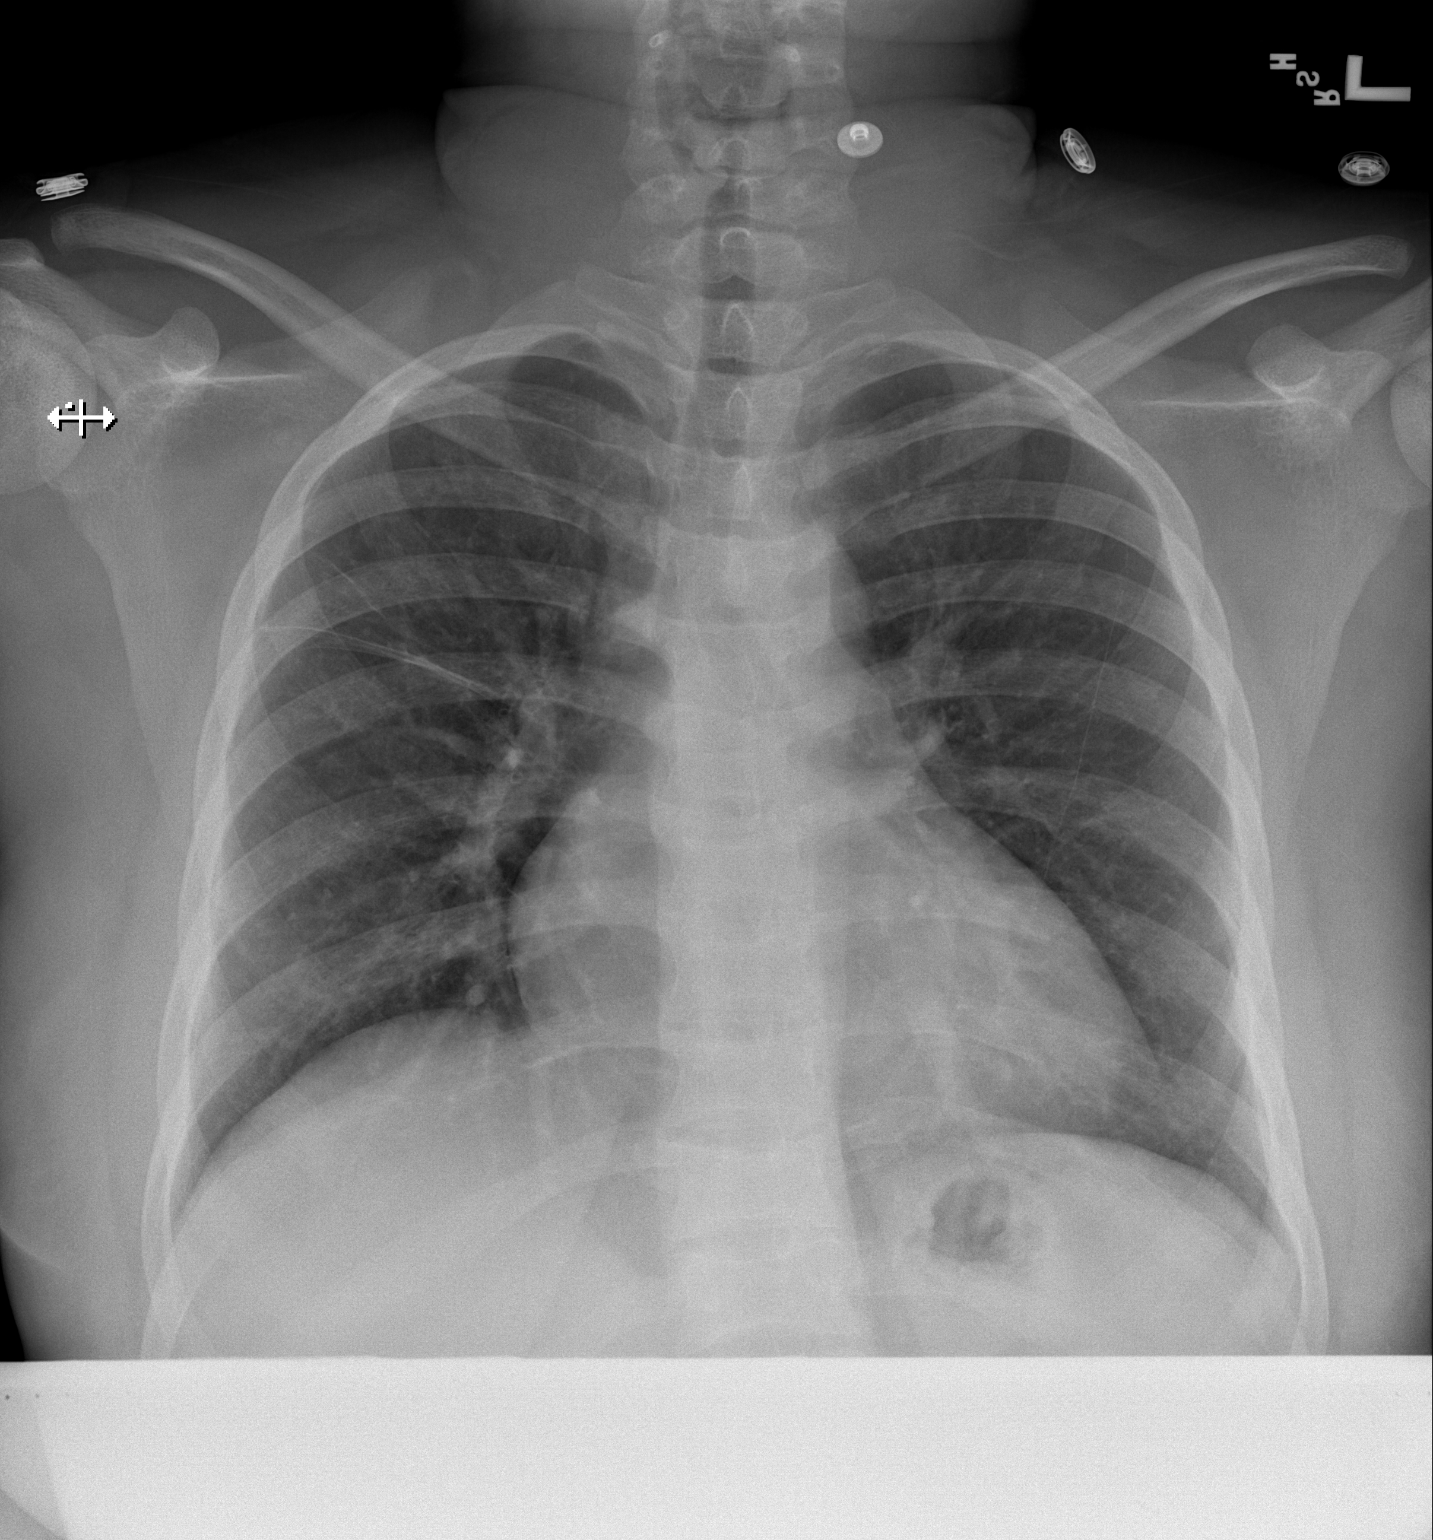

[w chest lat]
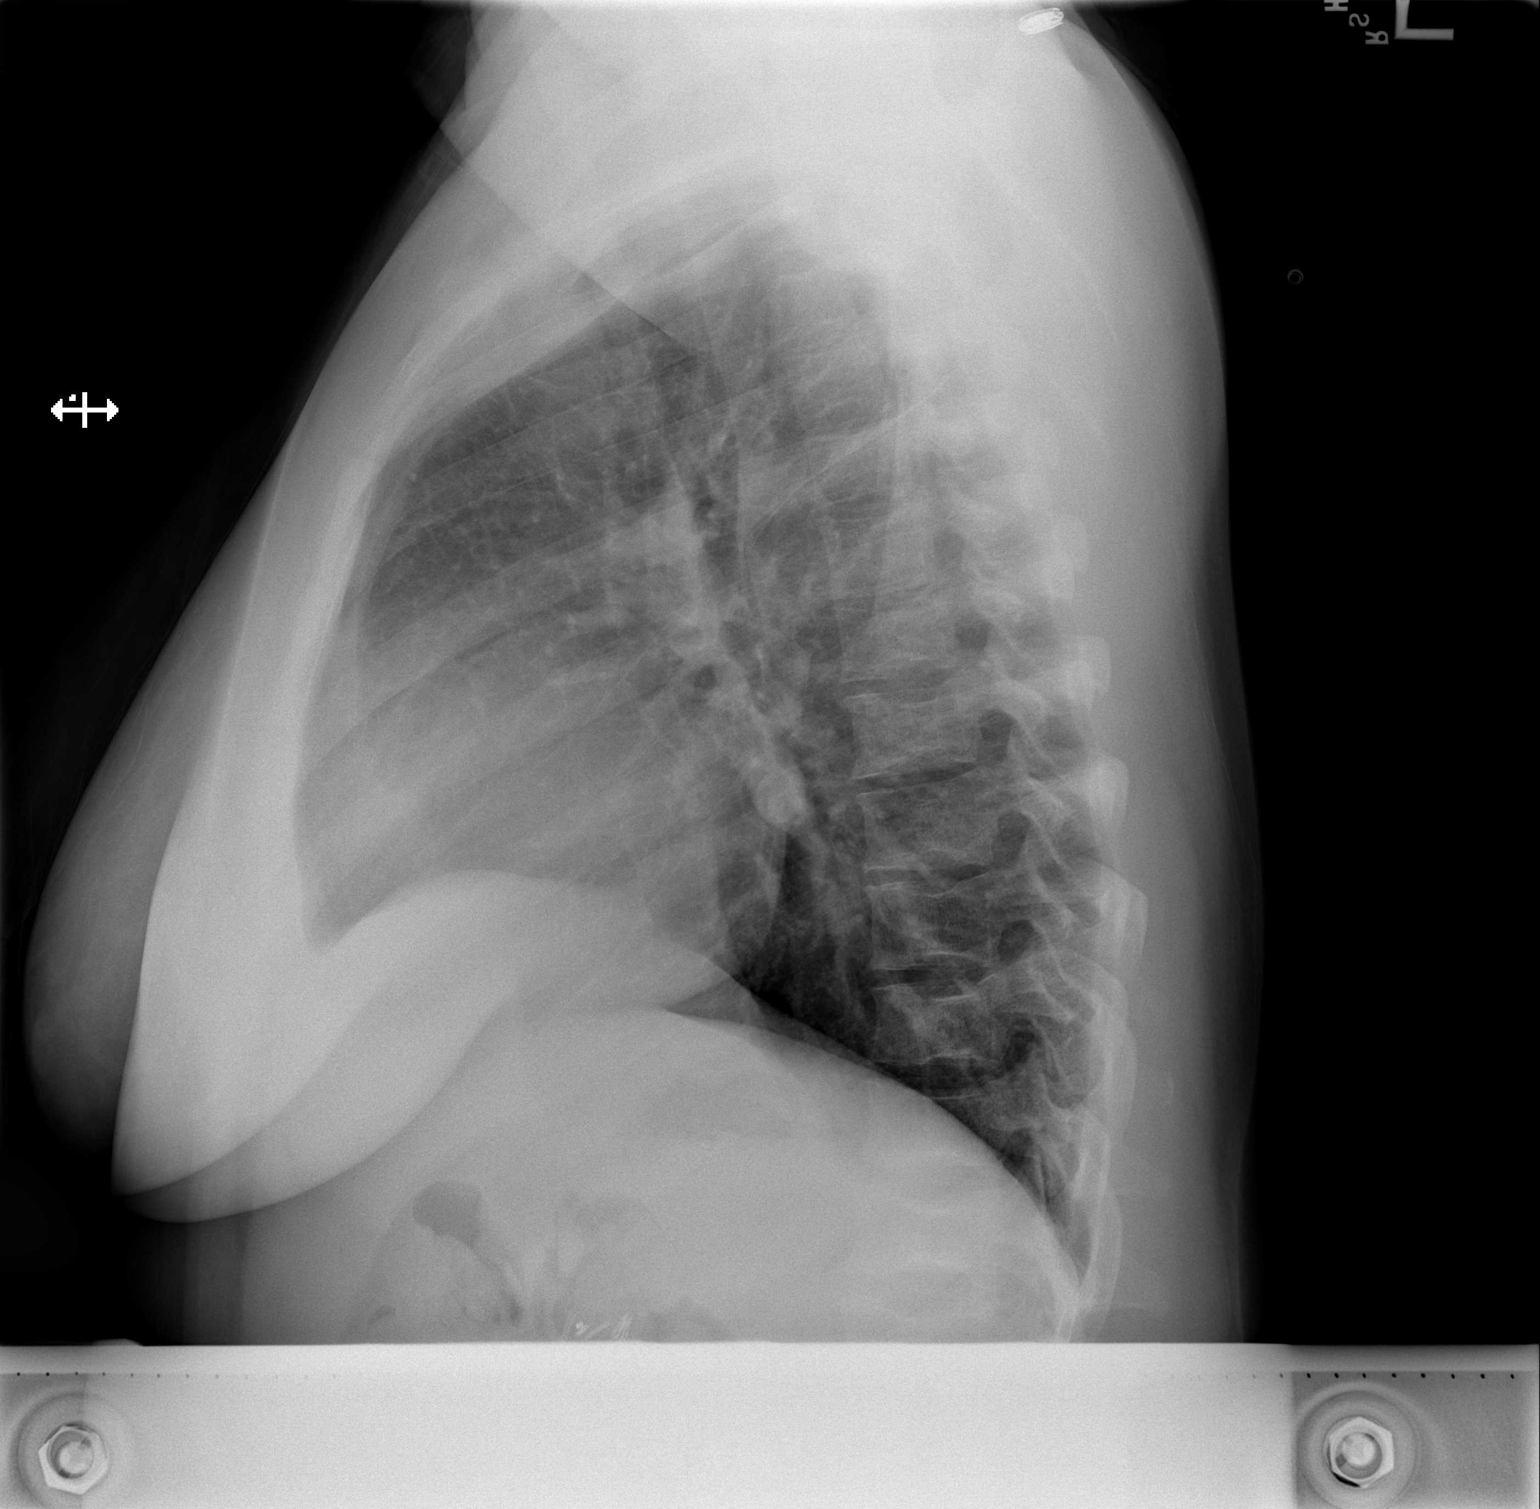

[2 of 2 positions shown; findings below may reference images not displayed]

FINDINGS: Trachea is midline.  Heart is mildly enlarged, stable.
There is linear scarring in the right perihilar region.  Lungs are
otherwise clear.  No pleural fluid.
IMPRESSION: No acute findings.

## 2012-03-04 MED ORDER — OXYCODONE HCL 5 MG PO TABS
15.0000 mg | ORAL_TABLET | ORAL | Status: DC | PRN
  Administered 2012-03-04 (×2): 15 mg via ORAL
  Administered 2012-03-06: 10 mg via ORAL
  Administered 2012-03-10 – 2012-03-15 (×5): 15 mg via ORAL
  Filled 2012-03-04 (×4): qty 3
  Filled 2012-03-04: qty 1
  Filled 2012-03-04: qty 3
  Filled 2012-03-04: qty 2
  Filled 2012-03-04 (×3): qty 3

## 2012-03-04 MED ORDER — LORAZEPAM 2 MG/ML IJ SOLN
INTRAMUSCULAR | Status: AC
  Administered 2012-03-04: 2 mg via INTRAVENOUS
  Filled 2012-03-04: qty 1

## 2012-03-04 MED ORDER — LORAZEPAM 2 MG/ML IJ SOLN
1.0000 mg | Freq: Once | INTRAMUSCULAR | Status: AC
  Administered 2012-03-05: 1 mg via INTRAVENOUS
  Filled 2012-03-04: qty 1

## 2012-03-04 MED ORDER — HYDROXYUREA 500 MG PO CAPS
1000.0000 mg | ORAL_CAPSULE | Freq: Every day | ORAL | Status: DC
  Administered 2012-03-04 – 2012-03-15 (×12): 1000 mg via ORAL
  Filled 2012-03-04 (×12): qty 2

## 2012-03-04 MED ORDER — FOLIC ACID 1 MG PO TABS
1.0000 mg | ORAL_TABLET | Freq: Every day | ORAL | Status: DC
Start: 2012-03-04 — End: 2012-03-15
  Administered 2012-03-04 – 2012-03-15 (×12): 1 mg via ORAL
  Filled 2012-03-04 (×12): qty 1

## 2012-03-04 MED ORDER — SODIUM CHLORIDE 0.9 % IV SOLN
1000.0000 mL | Freq: Once | INTRAVENOUS | Status: AC
  Administered 2012-03-04: 1000 mL via INTRAVENOUS

## 2012-03-04 MED ORDER — ONDANSETRON HCL 4 MG/2ML IJ SOLN
4.0000 mg | Freq: Four times a day (QID) | INTRAMUSCULAR | Status: DC | PRN
  Administered 2012-03-04 – 2012-03-12 (×4): 4 mg via INTRAVENOUS
  Filled 2012-03-04 (×4): qty 2

## 2012-03-04 MED ORDER — DOCUSATE SODIUM 100 MG PO CAPS
100.0000 mg | ORAL_CAPSULE | Freq: Two times a day (BID) | ORAL | Status: DC
  Administered 2012-03-04 – 2012-03-15 (×20): 100 mg via ORAL
  Filled 2012-03-04 (×18): qty 1

## 2012-03-04 MED ORDER — DEXTROSE-NACL 5-0.9 % IV SOLN: INTRAVENOUS | Status: DC

## 2012-03-04 MED ORDER — SODIUM CHLORIDE 0.9 % IV SOLN
1000.0000 mL | INTRAVENOUS | Status: DC
  Administered 2012-03-04 – 2012-03-05 (×4): 1000 mL via INTRAVENOUS

## 2012-03-04 MED ORDER — ONDANSETRON HCL 4 MG PO TABS: 4.0000 mg | ORAL_TABLET | Freq: Four times a day (QID) | ORAL | Status: DC | PRN

## 2012-03-04 MED ORDER — PANTOPRAZOLE SODIUM 40 MG PO TBEC
40.0000 mg | DELAYED_RELEASE_TABLET | Freq: Every day | ORAL | Status: DC
  Administered 2012-03-04 – 2012-03-07 (×4): 40 mg via ORAL
  Filled 2012-03-04 (×4): qty 1

## 2012-03-04 MED ORDER — HYDROMORPHONE HCL PF 1 MG/ML IJ SOLN
1.0000 mg | INTRAMUSCULAR | Status: DC | PRN
  Administered 2012-03-04 – 2012-03-06 (×18): 2 mg via INTRAVENOUS
  Filled 2012-03-04 (×10): qty 2

## 2012-03-04 MED ORDER — ENOXAPARIN SODIUM 40 MG/0.4ML ~~LOC~~ SOLN
40.0000 mg | Freq: Every day | SUBCUTANEOUS | Status: DC
  Administered 2012-03-04 – 2012-03-07 (×4): 40 mg via SUBCUTANEOUS
  Filled 2012-03-04 (×12): qty 0.4

## 2012-03-04 MED ORDER — HYDROMORPHONE HCL PF 2 MG/ML IJ SOLN
2.0000 mg | Freq: Once | INTRAMUSCULAR | Status: DC
  Filled 2012-03-04 (×10): qty 2

## 2012-03-04 NOTE — ED Notes (Signed)
Verbal order for Ativan 1 mg and Dilaudid 2mg  as well as a liter of 0.9nss. All items given per order and Lordis RN 6N called and updated.

## 2012-03-04 NOTE — Progress Notes (Signed)
Subjective:  Patient complains of low back pain grade 10 over 10. Denies any urinary complaints denies any fever or chills. Denies any chest pain or shortness of breath  Objective:  Vital Signs in the last 24 hours: Temp:  [97 F (36.1 C)-99.2 F (37.3 C)] 98.3 F (36.8 C) (10/20 0445) Pulse Rate:  [87-117] 110  (10/20 0445) Resp:  [16-18] 18  (10/20 0445) BP: (108-158)/(64-93) 133/82 mmHg (10/20 0445) SpO2:  [92 %-100 %] 99 % (10/20 0445) Weight:  [88.3 kg (194 lb 10.7 oz)] 88.3 kg (194 lb 10.7 oz) (10/20 0505)  Intake/Output from previous day: 10/19 0701 - 10/20 0700 In: 870 [P.O.:240; I.V.:630] Out: -  Intake/Output from this shift:    Physical Exam: Neck: no adenopathy, no carotid bruit, no JVD and supple, symmetrical, trachea midline Lungs: clear to auscultation bilaterally Heart: regular rate and rhythm, S1, S2 normal, no murmur, click, rub or gallop Abdomen: soft, non-tender; bowel sounds normal; no masses,  no organomegaly Extremities: extremities normal, atraumatic, no cyanosis or edema  Lab Results:  Basename 03/04/12 0630 03/03/12 2103  WBC 16.5* 16.0*  HGB 8.1* 9.2*  PLT 442* 473*    Basename 03/04/12 0630 03/03/12 2103  NA -- 138  K -- 3.5  CL -- 104  CO2 -- 24  GLUCOSE -- 91  BUN -- 9  CREATININE 0.61 0.66   No results found for this basename: TROPONINI:2,CK,MB:2 in the last 72 hours Hepatic Function Panel  Basename 03/03/12 2103  PROT 7.6  ALBUMIN 4.1  AST 20  ALT 10  ALKPHOS 60  BILITOT 2.3*  BILIDIR --  IBILI --   No results found for this basename: CHOL in the last 72 hours No results found for this basename: PROTIME in the last 72 hours  Imaging: Imaging results have been reviewed and No results found.  Cardiac Studies:  Assessment/Plan:  Acute sickle cell crisis Chronic pain syndrome Morbid obesity GERD Sickle cell anemia Depression Tobacco abuse Plan Continue present management  LOS: 1 day    [name]  N 03/04/2012, 11:23 AM

## 2012-03-04 NOTE — ED Notes (Signed)
Pt transported to CDU 7 for observation

## 2012-03-04 NOTE — ED Notes (Signed)
Report called to Lordis RN [id number] bed is ready ok to send pt.

## 2012-03-04 NOTE — H&P (Signed)
[facility] History and Physical  SUMMIT BORCHARDT ZOX:[id number] DOB: 09-16-1990    PCP:   Willey Blade, MD   Chief Complaint: Sickle cell crisis   HPI: [name] is an 21 y.o. female with hx of sickle cell crisis, chronic low back pain, obesity, GERD, sickle cell anemia, presents to the ER with increase low back pain, and bilateral shoulder pain. She denied any chest pain, SOB, fever or chills.  Evaluation in the ER included normal lytes and renal Fx tests, Hb of 9.2 grams per DL and leukocytosis with WBC of 16K. She was given IV pain med in the ER, but complained of persistent pain, so hospitalist was asked to admit her for sickle cell crisis.  Her reticulocyte count is elevated to 12 percent.  Rewiew of Systems:  Constitutional: Negative for malaise, fever and chills. No significant weight loss or weight gain Eyes: Negative for eye pain, redness and discharge, diplopia, visual changes, or flashes of light. ENMT: Negative for ear pain, hoarseness, nasal congestion, sinus pressure and sore throat. No headaches; tinnitus, drooling, or problem swallowing. Cardiovascular: Negative for chest pain, palpitations, diaphoresis, dyspnea and peripheral edema. ; No orthopnea, PND Respiratory: Negative for cough, hemoptysis, wheezing and stridor. No pleuritic chestpain. Gastrointestinal: Negative for nausea, vomiting, diarrhea, constipation, abdominal pain, melena, blood in stool, hematemesis, jaundice and rectal bleeding.    Genitourinary: Negative for frequency, dysuria, incontinence,flank pain and hematuria; Musculoskeletal: Negative for swelling and trauma.;  Skin: . Negative for pruritus, rash, abrasions, bruising and skin lesion.; ulcerations Neuro: Negative for headache, lightheadedness and neck stiffness. Negative for weakness, altered level of consciousness , altered mental status, extremity weakness, burning feet, involuntary movement, seizure and syncope.  Psych: negative for  anxiety, insomnia, tearfulness, panic attacks, hallucinations, paranoia, suicidal or homicidal ideation     Past Medical History  Diagnosis Date  . H/O: 1 miscarriage 03/22/2011  . 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 Depression 01/06/2011  . GERD (gastroesophageal reflux disease) 02/17/2011  . Trichotillomania     h/o  . Blood transfusion     "lots"  . Sickle cell anemia with crisis   . Exertional dyspnea     "sometimes"  . Sickle cell anemia   . Headache   . Migraines 11/08/11    "@ least twice/month"  . Chronic back pain     "very severe; have knot in my back; from tight muscle; take RX and exercise for it"  . Mood swings 11/08/11    "I go back and forth; real bad"    Past Surgical History  Procedure Date  . Cholecystectomy 05/2010  . Dilation and curettage of uterus 02/20/11    S/P miscarriage    Medications:  HOME MEDS: Prior to Admission medications   Medication Sig Start Date End Date Taking? Authorizing Provider  hydroxyurea (HYDREA) 500 MG capsule Take 1,000 mg by mouth daily. May take with food to minimize GI side effects. 08/22/11  Yes Dawn Wilson Singer, MD  oxyCODONE (ROXICODONE) 15 MG immediate release tablet Take 1 tablet (15 mg total) by mouth every 4 (four) hours as needed. For pain 02/16/12  Yes Gwenyth Bender, MD  pantoprazole (PROTONIX) 40 MG tablet Take 1 tablet (40 mg total) by mouth daily at 12 noon. 02/16/12  Yes Gwenyth Bender, MD     Allergies:  Allergies  Allergen Reactions  . Carrot Oil Hives and Swelling  . Latex Rash  . Toradol (Ketorolac Tromethamine) Hives and Itching    Social History:  reports that she has never smoked. She has never used smokeless tobacco. She reports that she uses illicit drugs (Marijuana and Other-see comments). She reports that she does not drink alcohol.  Family History: Family History  Problem Relation Age of Onset  . Diabetes Mother      Physical Exam: Filed Vitals:   03/03/12 1800 03/03/12 1900 03/03/12 2355  BP:  132/93 108/68 108/74  Pulse: 92 87 115  Temp: 99.2 F (37.3 C)  97 F (36.1 C)  TempSrc: Oral  Axillary  Resp: 16    SpO2: 100% 100% 96%   Blood pressure 108/74, pulse 115, temperature 97 F (36.1 C), temperature source Axillary, resp. rate 16, last menstrual period 02/08/2012, SpO2 96.00%.  GEN:  Pleasant patient lying in the stretcher in no acute distress; cooperative with exam. PSYCH:  alert and oriented x4; does not appear anxious or depressed; affect is appropriate. HEENT: Mucous membranes pink and anicteric; PERRLA; EOM intact; no cervical lymphadenopathy nor thyromegaly or carotid bruit; no JVD; There were no stridor. Neck is very supple. Breasts:: Not examined CHEST WALL: No tenderness CHEST: Normal respiration, clear to auscultation bilaterally.  HEART: Regular rate and rhythm.  There are no murmur, rub, or gallops.   BACK: No kyphosis or scoliosis; no CVA tenderness ABDOMEN: soft and non-tender; no masses, no organomegaly, normal abdominal bowel sounds; no pannus; no intertriginous candida. There is no rebound and no distention. Rectal Exam: Not done EXTREMITIES: No bone or joint deformity; age-appropriate arthropathy of the hands and knees; no edema; no ulcerations.  There is no calf tenderness. Genitalia: not examined PULSES: 2+ and symmetric SKIN: Normal hydration no rash or ulceration CNS: Cranial nerves 2-12 grossly intact no focal lateralizing neurologic deficit.  Speech is fluent; uvula elevated with phonation, facial symmetry and tongue midline. DTR are normal bilaterally, cerebella exam is intact, barbinski is negative and strengths are equaled bilaterally.  No sensory loss.   Labs on Admission:  Basic Metabolic Panel:  Lab 03/03/12 1610  NA 138  K 3.5  CL 104  CO2 24  GLUCOSE 91  BUN 9  CREATININE 0.66  CALCIUM 9.2  MG --  PHOS --   Liver Function Tests:  Lab 03/03/12 2103  AST 20  ALT 10  ALKPHOS 60  BILITOT 2.3*  PROT 7.6  ALBUMIN 4.1   No  results found for this basename: LIPASE:5,AMYLASE:5 in the last 168 hours CBC:  Lab 03/03/12 2103  WBC 16.0*  NEUTROABS 9.8*  HGB 9.2*  HCT 26.0*  MCV 88.4  PLT 473*   CBG: No results found for this basename: GLUCAP:5 in the last 168 hours   Radiological Exams on Admission: No results found.   Assessment/Plan Present on Admission:  .SICKLE CELL ANEMIA .Vaso-occlusive sickle cell crisis .Overweight .Depression .Active smoker .Back pain   PLAN:  Sickle cell crisis.  Will admit to Dr Moody Bruins service.  Dr Sharyn Lull will resume care tomorrow as per prior arrangements.  I spoke with him tonight to make aware of her admission.  Will treat with IVF, IV pain medications as tolerated, and supplement Oxygen.  Patient is stable, full code, and will be admitted to general medical floor.  Her home meds will be continued.    Other plans as per orders.  Code Status: FULL Unk Lightning, MD. [facility] Pager [phone] 7pm to 7am.  03/04/2012, 2:46 AM

## 2012-03-05 MED ORDER — POTASSIUM CHLORIDE IN NACL 20-0.45 MEQ/L-% IV SOLN
INTRAVENOUS | Status: DC
  Administered 2012-03-05 – 2012-03-06 (×2): via INTRAVENOUS
  Administered 2012-03-06: 100 mL/h via INTRAVENOUS
  Administered 2012-03-07 – 2012-03-15 (×11): via INTRAVENOUS
  Filled 2012-03-05 (×19): qty 1000

## 2012-03-05 NOTE — Progress Notes (Signed)
Subjective:  Patient continues to complain of low back pain. She rates her pain as a 9/10. She had some relief with Dilaudid 4 mg when she first came in. States the present dosing is not helping as well. She acknowledges that she may have been pushing himself more with school and not getting adequate rest. She denies chest pains. No wheezing.   Allergies  Allergen Reactions  . Carrot Oil Hives and Swelling  . Latex Rash  . Toradol (Ketorolac Tromethamine) Hives and Itching   Current Facility-Administered Medications  Medication Dose Route Frequency Provider Last Rate Last Dose  . 0.9 %  sodium chloride infusion  250 mL Intravenous PRN Arthor Captain, PA-C 10 mL/hr at 03/03/12 2044 250 mL at 03/03/12 2044  . 0.9 %  sodium chloride infusion  1,000 mL Intravenous Continuous Houston Siren, MD 125 mL/hr at 03/05/12 1212 1,000 mL at 03/05/12 1212  . dextrose 5 %-0.9 % sodium chloride infusion   Intravenous Continuous Houston Siren, MD      . docusate sodium (COLACE) capsule 100 mg  100 mg Oral BID Houston Siren, MD   100 mg at 03/05/12 0950  . enoxaparin (LOVENOX) injection 40 mg  40 mg Subcutaneous Daily Houston Siren, MD   40 mg at 03/05/12 0908  . folic acid (FOLVITE) tablet 1 mg  1 mg Oral Daily Houston Siren, MD   1 mg at 03/05/12 0908  . HYDROmorphone (DILAUDID) injection 1-2 mg  1-2 mg Intravenous Q2H PRN Houston Siren, MD   2 mg at 03/05/12 1841  . HYDROmorphone (DILAUDID) injection 2 mg  2 mg Intravenous Once Houston Siren, MD      . hydroxyurea (HYDREA) capsule 1,000 mg  1,000 mg Oral Daily Houston Siren, MD   1,000 mg at 03/05/12 0909  . LORazepam (ATIVAN) injection 1 mg  1 mg Intravenous Once Houston Siren, MD   1 mg at 03/05/12 0059  . ondansetron (ZOFRAN) tablet 4 mg  4 mg Oral Q6H PRN Houston Siren, MD       Or  . ondansetron [facility]) injection 4 mg  4 mg Intravenous Q6H PRN Houston Siren, MD   4 mg at 03/05/12 0431  . oxyCODONE (Oxy IR/ROXICODONE) immediate release tablet 15 mg  15 mg Oral Q4H PRN Houston Siren, MD   15 mg at 03/04/12 1648   . pantoprazole (PROTONIX) EC tablet 40 mg  40 mg Oral Q1200 Houston Siren, MD   40 mg at 03/05/12 1208    Objective: Blood pressure 117/67, pulse 104, temperature 98.2 F (36.8 C), temperature source Oral, resp. rate 16, height 5\' 2"  (1.575 m), weight 194 lb 10.7 oz (88.3 kg), last menstrual period 02/08/2012, SpO2 93.00%.  Well-developed overweight black female in moderate distress. HEENT: No sinus tenderness. Mild scleral icterus. NECK: No enlarged thyroid. No posterior cervical nodes. LUNGS: Diminished breath sounds without wheezes. No vocal fremitus. No CVA tenderness. CV: Normal S1, S2 without S3.  ABD: No epigastric tenderness. MSK: Tenderness in the lumbar sacral spine. Minimal tenderness in knees. Negative Homans. NEURO: Intact.  Lab results: Results for orders placed during the hospital encounter of 03/03/12 (from the past 48 hour(s))  URINALYSIS, ROUTINE W REFLEX MICROSCOPIC     Status: Abnormal   Collection Time   03/03/12  8:42 PM      Component Value Range Comment   Color, Urine YELLOW  YELLOW    APPearance CLEAR  CLEAR    Specific Gravity, Urine 1.013  1.005 - 1.030  pH 8.5 (*) 5.0 - 8.0    Glucose, UA NEGATIVE  NEGATIVE mg/dL    Hgb urine dipstick NEGATIVE  NEGATIVE    Bilirubin Urine NEGATIVE  NEGATIVE    Ketones, ur NEGATIVE  NEGATIVE mg/dL    Protein, ur NEGATIVE  NEGATIVE mg/dL    Urobilinogen, UA 1.0  0.0 - 1.0 mg/dL    Nitrite NEGATIVE  NEGATIVE    Leukocytes, UA NEGATIVE  NEGATIVE MICROSCOPIC NOT DONE ON URINES WITH NEGATIVE PROTEIN, BLOOD, LEUKOCYTES, NITRITE, OR GLUCOSE <1000 mg/dL.  POCT PREGNANCY, URINE     Status: Normal   Collection Time   03/03/12  8:43 PM      Component Value Range Comment   Preg Test, Ur NEGATIVE  NEGATIVE   COMPREHENSIVE METABOLIC PANEL     Status: Abnormal   Collection Time   03/03/12  9:03 PM      Component Value Range Comment   Sodium 138  135 - 145 mEq/L    Potassium 3.5  3.5 - 5.1 mEq/L    Chloride 104  96 - 112 mEq/L      CO2 24  19 - 32 mEq/L    Glucose, Bld 91  70 - 99 mg/dL    BUN 9  6 - 23 mg/dL    Creatinine, Ser 9.60  0.50 - 1.10 mg/dL    Calcium 9.2  8.4 - 45.4 mg/dL    Total Protein 7.6  6.0 - 8.3 g/dL    Albumin 4.1  3.5 - 5.2 g/dL    AST 20  0 - 37 U/L    ALT 10  0 - 35 U/L    Alkaline Phosphatase 60  39 - 117 U/L    Total Bilirubin 2.3 (*) 0.3 - 1.2 mg/dL    GFR calc non Af Amer >90  >90 mL/min    GFR calc Af Amer >90  >90 mL/min   CBC WITH DIFFERENTIAL     Status: Abnormal   Collection Time   03/03/12  9:03 PM      Component Value Range Comment   WBC 16.0 (*) 4.0 - 10.5 K/uL    RBC 2.94 (*) 3.87 - 5.11 MIL/uL    Hemoglobin 9.2 (*) 12.0 - 15.0 g/dL    HCT 09.8 (*) 11.9 - 46.0 %    MCV 88.4  78.0 - 100.0 fL    MCH 31.3  26.0 - 34.0 pg    MCHC 35.4  30.0 - 36.0 g/dL    RDW 14.7 (*) 82.9 - 15.5 %    Platelets 473 (*) 150 - 400 K/uL    Neutrophils Relative 61  43 - 77 %    Neutro Abs 9.8 (*) 1.7 - 7.7 K/uL    Lymphocytes Relative 24  12 - 46 %    Lymphs Abs 3.8  0.7 - 4.0 K/uL    Monocytes Relative 10  3 - 12 %    Monocytes Absolute 1.6 (*) 0.1 - 1.0 K/uL    Eosinophils Relative 5  0 - 5 %    Eosinophils Absolute 0.7  0.0 - 0.7 K/uL    Basophils Relative 1  0 - 1 %    Basophils Absolute 0.1  0.0 - 0.1 K/uL   RETICULOCYTES     Status: Abnormal   Collection Time   03/03/12  9:03 PM      Component Value Range Comment   Retic Ct Pct 12.9 (*) 0.4 - 3.1 %    RBC. 2.94 (*)  3.87 - 5.11 MIL/uL    Retic Count, Manual 379.3 (*) 19.0 - 186.0 K/uL   CBC     Status: Abnormal   Collection Time   03/04/12  6:30 AM      Component Value Range Comment   WBC 16.5 (*) 4.0 - 10.5 K/uL    RBC 2.64 (*) 3.87 - 5.11 MIL/uL    Hemoglobin 8.1 (*) 12.0 - 15.0 g/dL    HCT 09.8 (*) 11.9 - 46.0 %    MCV 87.9  78.0 - 100.0 fL    MCH 30.7  26.0 - 34.0 pg    MCHC 34.9  30.0 - 36.0 g/dL    RDW 14.7 (*) 82.9 - 15.5 %    Platelets 442 (*) 150 - 400 K/uL   CREATININE, SERUM     Status: Normal   Collection  Time   03/04/12  6:30 AM      Component Value Range Comment   Creatinine, Ser 0.61  0.50 - 1.10 mg/dL    GFR calc non Af Amer >90  >90 mL/min    GFR calc Af Amer >90  >90 mL/min     Studies/Results: No results found.  Patient Active Problem List  Diagnosis  . SICKLE CELL ANEMIA  . TRICHOTILLOMANIA  . Active smoker  . Back pain  . Depression  . GERD (gastroesophageal reflux disease)  . Contraception management  . Stress  . Overweight  . Vaso-occlusive sickle cell crisis  . Right thigh pain  . Vaginosis  . Tachycardia  . Headache  . Nausea & vomiting    Impression: Sickle cell crisis. Skeletal pain. Situational stress. Inadequate pain control. Leukocytosis secondary to sickle cell crisis.  Plan: Change IV fluids hypotonic saline. Incentive spirometry every 2 hours. Adjust IV pain medication to effect pain control. Followup CBC, CMET in a.m.   August Saucer, Silvie Obremski 03/05/2012 7:49 PM

## 2012-03-06 LAB — COMPREHENSIVE METABOLIC PANEL
ALT: 15 U/L (ref 0–35)
AST: 28 U/L (ref 0–37)
Albumin: 3.7 g/dL (ref 3.5–5.2)
Calcium: 9.2 mg/dL (ref 8.4–10.5)
Chloride: 101 mEq/L (ref 96–112)
Creatinine, Ser: 0.63 mg/dL (ref 0.50–1.10)
Sodium: 136 mEq/L (ref 135–145)

## 2012-03-06 LAB — CBC WITH DIFFERENTIAL/PLATELET
Band Neutrophils: 0 % (ref 0–10)
Basophils Absolute: 0 10*3/uL (ref 0.0–0.1)
Basophils Relative: 0 % (ref 0–1)
Blasts: 0 %
HCT: 24.9 % — ABNORMAL LOW (ref 36.0–46.0)
Hemoglobin: 8.5 g/dL — ABNORMAL LOW (ref 12.0–15.0)
Lymphocytes Relative: 9 % — ABNORMAL LOW (ref 12–46)
Lymphs Abs: 1.6 10*3/uL (ref 0.7–4.0)
MCHC: 34.1 g/dL (ref 30.0–36.0)
MCV: 91.5 fL (ref 78.0–100.0)
Metamyelocytes Relative: 0 %
Monocytes Absolute: 0.5 10*3/uL (ref 0.1–1.0)
Promyelocytes Absolute: 0 %
RDW: 19.8 % — ABNORMAL HIGH (ref 11.5–15.5)

## 2012-03-06 LAB — TYPE AND SCREEN

## 2012-03-06 MED ORDER — HYDROMORPHONE HCL PF 1 MG/ML IJ SOLN
3.0000 mg | INTRAMUSCULAR | Status: DC | PRN
  Administered 2012-03-06 – 2012-03-15 (×76): 3 mg via INTRAVENOUS
  Filled 2012-03-06 (×6): qty 3
  Filled 2012-03-06: qty 1
  Filled 2012-03-06 (×3): qty 3
  Filled 2012-03-06: qty 1
  Filled 2012-03-06 (×18): qty 3
  Filled 2012-03-06: qty 2
  Filled 2012-03-06 (×7): qty 3
  Filled 2012-03-06: qty 1
  Filled 2012-03-06 (×6): qty 3
  Filled 2012-03-06: qty 2
  Filled 2012-03-06 (×17): qty 3
  Filled 2012-03-06: qty 1
  Filled 2012-03-06 (×14): qty 3
  Filled 2012-03-06: qty 1
  Filled 2012-03-06 (×4): qty 3

## 2012-03-07 LAB — COMPREHENSIVE METABOLIC PANEL
AST: 22 U/L (ref 0–37)
Albumin: 3.8 g/dL (ref 3.5–5.2)
CO2: 25 mEq/L (ref 19–32)
Calcium: 9.2 mg/dL (ref 8.4–10.5)
Creatinine, Ser: 0.61 mg/dL (ref 0.50–1.10)
GFR calc non Af Amer: >90 mL/min (ref >90)
Total Protein: 7.1 g/dL (ref 6.0–8.3)

## 2012-03-07 LAB — CBC WITH DIFFERENTIAL/PLATELET
Band Neutrophils: 0 % (ref 0–10)
Basophils Absolute: 0.1 10*3/uL (ref 0.0–0.1)
Basophils Relative: 1 % (ref 0–1)
Eosinophils Absolute: 0.6 10*3/uL (ref 0.0–0.7)
Eosinophils Relative: 4 % (ref 0–5)
Hemoglobin: 7.8 g/dL — ABNORMAL LOW (ref 12.0–15.0)
Lymphs Abs: 3.6 10*3/uL (ref 0.7–4.0)
MCH: 31.8 pg (ref 26.0–34.0)
MCHC: 34.2 g/dL (ref 30.0–36.0)
MCV: 93.1 fL (ref 78.0–100.0)
Metamyelocytes Relative: 0 %
Monocytes Absolute: 0.7 10*3/uL (ref 0.1–1.0)
Myelocytes: 1 %
Neutro Abs: 8.8 10*3/uL — ABNORMAL HIGH (ref 1.7–7.7)
RBC: 2.45 MIL/uL — ABNORMAL LOW (ref 3.87–5.11)

## 2012-03-07 NOTE — Progress Notes (Signed)
Subjective:  Patient reports mild improvement in her overall pain. She knows to present pain medication is helping better than the earlier doses. She still describes pain in the lower back and leg. She denies chest pains or shortness of breath. Appetite has been fair. Acknowledges recent stressors with school and family.   Allergies  Allergen Reactions  . Carrot Oil Hives and Swelling  . Latex Rash  . Toradol (Ketorolac Tromethamine) Hives and Itching   Current Facility-Administered Medications  Medication Dose Route Frequency Provider Last Rate Last Dose  . 0.45 % NaCl with KCl 20 mEq / L infusion   Intravenous Continuous Gwenyth Bender, MD 100 mL/hr at 03/06/12 1650    . 0.9 %  sodium chloride infusion  250 mL Intravenous PRN Arthor Captain, PA-C 10 mL/hr at 03/03/12 2044 250 mL at 03/03/12 2044  . 0.9 %  sodium chloride infusion  1,000 mL Intravenous Continuous Houston Siren, MD 125 mL/hr at 03/05/12 1212 1,000 mL at 03/05/12 1212  . docusate sodium (COLACE) capsule 100 mg  100 mg Oral BID Houston Siren, MD   100 mg at 03/06/12 2135  . enoxaparin (LOVENOX) injection 40 mg  40 mg Subcutaneous Daily Houston Siren, MD   40 mg at 03/06/12 0950  . folic acid (FOLVITE) tablet 1 mg  1 mg Oral Daily Houston Siren, MD   1 mg at 03/06/12 0950  . HYDROmorphone (DILAUDID) injection 2 mg  2 mg Intravenous Once Houston Siren, MD      . HYDROmorphone (DILAUDID) injection 3 mg  3 mg Intravenous Q2H PRN Gwenyth Bender, MD   3 mg at 03/06/12 2237  . hydroxyurea (HYDREA) capsule 1,000 mg  1,000 mg Oral Daily Houston Siren, MD   1,000 mg at 03/06/12 0950  . ondansetron (ZOFRAN) tablet 4 mg  4 mg Oral Q6H PRN Houston Siren, MD       Or  . ondansetron [facility]) injection 4 mg  4 mg Intravenous Q6H PRN Houston Siren, MD   4 mg at 03/05/12 1952  . oxyCODONE (Oxy IR/ROXICODONE) immediate release tablet 15 mg  15 mg Oral Q4H PRN Houston Siren, MD   10 mg at 03/06/12 0036  . pantoprazole (PROTONIX) EC tablet 40 mg  40 mg Oral Q1200 Houston Siren, MD   40 mg at 03/06/12  1134  . DISCONTD: HYDROmorphone (DILAUDID) injection 1-2 mg  1-2 mg Intravenous Q2H PRN Houston Siren, MD   2 mg at 03/06/12 8119    Objective: Blood pressure 109/57, pulse 70, temperature 99.3 F (37.4 C), temperature source Oral, resp. rate 20, height 5\' 2"  (1.575 m), weight 194 lb 10.7 oz (88.3 kg), last menstrual period 02/08/2012, SpO2 95.00%.  Well-developed anxious black female in no acute distress. HEENT:no sinus tenderness. No sclera icterus. TMs are clear. NECK:no enlarged thyroid. No posterior cervical nodes. LUNGS:clear to auscultation. No vocal fremitus. JY:NWGNFA S1, S2 without S3. OZH:YQMV, nontender. HQI:ONGEXBMWUX in lower back and right hip region. Negative Homans. NEURO:nonfocal.  Lab results: Results for orders placed during the hospital encounter of 03/03/12 (from the past 48 hour(s))  CBC WITH DIFFERENTIAL     Status: Abnormal   Collection Time   03/06/12  6:05 AM      Component Value Range Comment   WBC 17.6 (*) 4.0 - 10.5 K/uL    RBC 2.72 (*) 3.87 - 5.11 MIL/uL    Hemoglobin 8.5 (*) 12.0 - 15.0 g/dL    HCT 32.4 (*) 40.1 - 46.0 %  MCV 91.5  78.0 - 100.0 fL    MCH 31.3  26.0 - 34.0 pg    MCHC 34.1  30.0 - 36.0 g/dL    RDW 16.1 (*) 09.6 - 15.5 %    Platelets 508 (*) 150 - 400 K/uL    Neutrophils Relative 85 (*) 43 - 77 %    Lymphocytes Relative 9 (*) 12 - 46 %    Monocytes Relative 3  3 - 12 %    Eosinophils Relative 3  0 - 5 %    Basophils Relative 0  0 - 1 %    Band Neutrophils 0  0 - 10 %    Metamyelocytes Relative 0      Myelocytes 0      Promyelocytes Absolute 0      Blasts 0      nRBC 0  0 /100 WBC    Neutro Abs 15.0 (*) 1.7 - 7.7 K/uL    Lymphs Abs 1.6  0.7 - 4.0 K/uL    Monocytes Absolute 0.5  0.1 - 1.0 K/uL    Eosinophils Absolute 0.5  0.0 - 0.7 K/uL    Basophils Absolute 0.0  0.0 - 0.1 K/uL    RBC Morphology SICKLE CELLS     COMPREHENSIVE METABOLIC PANEL     Status: Abnormal   Collection Time   03/06/12  6:05 AM      Component Value  Range Comment   Sodium 136  135 - 145 mEq/L    Potassium 3.6  3.5 - 5.1 mEq/L    Chloride 101  96 - 112 mEq/L    CO2 26  19 - 32 mEq/L    Glucose, Bld 93  70 - 99 mg/dL    BUN 4 (*) 6 - 23 mg/dL    Creatinine, Ser 0.45  0.50 - 1.10 mg/dL    Calcium 9.2  8.4 - 40.9 mg/dL    Total Protein 7.1  6.0 - 8.3 g/dL    Albumin 3.7  3.5 - 5.2 g/dL    AST 28  0 - 37 U/L    ALT 15  0 - 35 U/L    Alkaline Phosphatase 59  39 - 117 U/L    Total Bilirubin 3.0 (*) 0.3 - 1.2 mg/dL    GFR calc non Af Amer >90  >90 mL/min    GFR calc Af Amer >90  >90 mL/min   TYPE AND SCREEN     Status: Normal   Collection Time   03/06/12  8:00 PM      Component Value Range Comment   ABO/RH(D) B POS      Antibody Screen POS      Sample Expiration 03/09/2012      Antibody Identification NO CLINICALLY SIGNIFICANT ANTIBODY IDENTIFIED.      DAT, IgG NEG       Studies/Results: No results found.  Patient Active Problem List  Diagnosis  . SICKLE CELL ANEMIA  . TRICHOTILLOMANIA  . Active smoker  . Back pain  . Depression  . GERD (gastroesophageal reflux disease)  . Contraception management  . Stress  . Overweight  . Vaso-occlusive sickle cell crisis  . Right thigh pain  . Vaginosis  . Tachycardia  . Headache  . Nausea & vomiting    Impression: Sickle cell crisis. 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 Anxiety disorder. Gastroesophageal reflux disease.   Plan: Continue IV fluids, IV analgesia, IV anti-emetics. Followup CBC, CMET. Monitor for indications  for transfusion or exchange transfusion.   August Saucer, Vertis Scheib 03/07/2012 12:17 AM

## 2012-03-07 NOTE — Progress Notes (Signed)
Subjective:  Nursing is reporting hearing arguments from patient room this evening. Patient apparently has been arguing with her boyfriend. When seen today she appears upset with altered mood. She reports a pain in the back at 7/10.she denies chest pains or shortness of breath. Ongoing anxiety continues. We'll discuss further at a later time. Will suggest followup with clinical social worker as well.   Allergies  Allergen Reactions  . Carrot Oil Hives and Swelling  . Latex Rash  . Toradol (Ketorolac Tromethamine) Hives and Itching   Current Facility-Administered Medications  Medication Dose Route Frequency Provider Last Rate Last Dose  . 0.45 % NaCl with KCl 20 mEq / L infusion   Intravenous Continuous Gwenyth Bender, MD 100 mL/hr at 03/07/12 0220    . 0.9 %  sodium chloride infusion  250 mL Intravenous PRN Arthor Captain, PA-C 10 mL/hr at 03/03/12 2044 250 mL at 03/03/12 2044  . 0.9 %  sodium chloride infusion  1,000 mL Intravenous Continuous Houston Siren, MD 125 mL/hr at 03/05/12 1212 1,000 mL at 03/05/12 1212  . docusate sodium (COLACE) capsule 100 mg  100 mg Oral BID Houston Siren, MD   100 mg at 03/07/12 1000  . enoxaparin (LOVENOX) injection 40 mg  40 mg Subcutaneous Daily Houston Siren, MD   40 mg at 03/07/12 1000  . folic acid (FOLVITE) tablet 1 mg  1 mg Oral Daily Houston Siren, MD   1 mg at 03/07/12 1110  . HYDROmorphone (DILAUDID) injection 2 mg  2 mg Intravenous Once Houston Siren, MD      . HYDROmorphone (DILAUDID) injection 3 mg  3 mg Intravenous Q2H PRN Gwenyth Bender, MD   3 mg at 03/07/12 1648  . hydroxyurea (HYDREA) capsule 1,000 mg  1,000 mg Oral Daily Houston Siren, MD   1,000 mg at 03/07/12 1106  . ondansetron (ZOFRAN) tablet 4 mg  4 mg Oral Q6H PRN Houston Siren, MD       Or  . ondansetron [facility]) injection 4 mg  4 mg Intravenous Q6H PRN Houston Siren, MD   4 mg at 03/05/12 1952  . oxyCODONE (Oxy IR/ROXICODONE) immediate release tablet 15 mg  15 mg Oral Q4H PRN Houston Siren, MD   10 mg at 03/06/12 0036  .  pantoprazole (PROTONIX) EC tablet 40 mg  40 mg Oral Q1200 Houston Siren, MD   40 mg at 03/07/12 1110    Objective: Blood pressure 115/37, pulse 87, temperature 99.1 F (37.3 C), temperature source Oral, resp. rate 22, height 5\' 2"  (1.575 m), weight 194 lb 10.7 oz (88.3 kg), last menstrual period 02/08/2012, SpO2 100.00%.  Well-developed overweight black female in no acute distress no upset. HEENT:no sinus tenderness. Mild sclera icterus. NECK:no enlarged thyroid. No posterior cervical nodes. LUNGS:clear to auscultation. No CVA tenderness. XB:JYNWGN S1, S2 without S3. FAO:ZHYQ, nontender. MVH:QIONGEXBMW tenderness in lower back with minimal paraspinal muscle prominence. Negative Homans. NEURO:nonfocal. PSYCHIATRIC: As noted above.  Lab results: Results for orders placed during the hospital encounter of 03/03/12 (from the past 48 hour(s))  CBC WITH DIFFERENTIAL     Status: Abnormal   Collection Time   03/06/12  6:05 AM      Component Value Range Comment   WBC 17.6 (*) 4.0 - 10.5 K/uL    RBC 2.72 (*) 3.87 - 5.11 MIL/uL    Hemoglobin 8.5 (*) 12.0 - 15.0 g/dL    HCT 41.3 (*) 24.4 - 46.0 %    MCV 91.5  78.0 - 100.0 fL  MCH 31.3  26.0 - 34.0 pg    MCHC 34.1  30.0 - 36.0 g/dL    RDW 16.1 (*) 09.6 - 15.5 %    Platelets 508 (*) 150 - 400 K/uL    Neutrophils Relative 85 (*) 43 - 77 %    Lymphocytes Relative 9 (*) 12 - 46 %    Monocytes Relative 3  3 - 12 %    Eosinophils Relative 3  0 - 5 %    Basophils Relative 0  0 - 1 %    Band Neutrophils 0  0 - 10 %    Metamyelocytes Relative 0      Myelocytes 0      Promyelocytes Absolute 0      Blasts 0      nRBC 0  0 /100 WBC    Neutro Abs 15.0 (*) 1.7 - 7.7 K/uL    Lymphs Abs 1.6  0.7 - 4.0 K/uL    Monocytes Absolute 0.5  0.1 - 1.0 K/uL    Eosinophils Absolute 0.5  0.0 - 0.7 K/uL    Basophils Absolute 0.0  0.0 - 0.1 K/uL    RBC Morphology SICKLE CELLS     COMPREHENSIVE METABOLIC PANEL     Status: Abnormal   Collection Time   03/06/12   6:05 AM      Component Value Range Comment   Sodium 136  135 - 145 mEq/L    Potassium 3.6  3.5 - 5.1 mEq/L    Chloride 101  96 - 112 mEq/L    CO2 26  19 - 32 mEq/L    Glucose, Bld 93  70 - 99 mg/dL    BUN 4 (*) 6 - 23 mg/dL    Creatinine, Ser 0.45  0.50 - 1.10 mg/dL    Calcium 9.2  8.4 - 40.9 mg/dL    Total Protein 7.1  6.0 - 8.3 g/dL    Albumin 3.7  3.5 - 5.2 g/dL    AST 28  0 - 37 U/L    ALT 15  0 - 35 U/L    Alkaline Phosphatase 59  39 - 117 U/L    Total Bilirubin 3.0 (*) 0.3 - 1.2 mg/dL    GFR calc non Af Amer >90  >90 mL/min    GFR calc Af Amer >90  >90 mL/min   TYPE AND SCREEN     Status: Normal   Collection Time   03/06/12  8:00 PM      Component Value Range Comment   ABO/RH(D) B POS      Antibody Screen POS      Sample Expiration 03/09/2012      Antibody Identification NO CLINICALLY SIGNIFICANT ANTIBODY IDENTIFIED.      DAT, IgG NEG     CBC WITH DIFFERENTIAL     Status: Abnormal   Collection Time   03/07/12  6:45 AM      Component Value Range Comment   WBC 13.8 (*) 4.0 - 10.5 K/uL    RBC 2.45 (*) 3.87 - 5.11 MIL/uL    Hemoglobin 7.8 (*) 12.0 - 15.0 g/dL    HCT 81.1 (*) 91.4 - 46.0 %    MCV 93.1  78.0 - 100.0 fL    MCH 31.8  26.0 - 34.0 pg    MCHC 34.2  30.0 - 36.0 g/dL    RDW 78.2 (*) 95.6 - 15.5 %    Platelets 422 (*) 150 - 400 K/uL PLATELET COUNT CONFIRMED BY  SMEAR   Neutrophils Relative 63  43 - 77 %    Lymphocytes Relative 26  12 - 46 %    Monocytes Relative 5  3 - 12 %    Eosinophils Relative 4  0 - 5 %    Basophils Relative 1  0 - 1 %    Band Neutrophils 0  0 - 10 %    Metamyelocytes Relative 0      Myelocytes 1      Promyelocytes Absolute 0      Blasts 0      nRBC 0  0 /100 WBC    RBC Morphology MARKED POLYCHROMASIA      WBC Morphology MILD LEFT SHIFT (1-5% METAS, OCC MYELO, OCC BANDS)      Smear Review LARGE PLATELETS PRESENT      Neutro Abs 8.8 (*) 1.7 - 7.7 K/uL    Lymphs Abs 3.6  0.7 - 4.0 K/uL    Monocytes Absolute 0.7  0.1 - 1.0 K/uL     Eosinophils Absolute 0.6  0.0 - 0.7 K/uL    Basophils Absolute 0.1  0.0 - 0.1 K/uL   COMPREHENSIVE METABOLIC PANEL     Status: Abnormal   Collection Time   03/07/12  6:45 AM      Component Value Range Comment   Sodium 136  135 - 145 mEq/L    Potassium 3.8  3.5 - 5.1 mEq/L    Chloride 101  96 - 112 mEq/L    CO2 25  19 - 32 mEq/L    Glucose, Bld 95  70 - 99 mg/dL    BUN 6  6 - 23 mg/dL    Creatinine, Ser 1.61  0.50 - 1.10 mg/dL    Calcium 9.2  8.4 - 09.6 mg/dL    Total Protein 7.1  6.0 - 8.3 g/dL    Albumin 3.8  3.5 - 5.2 g/dL    AST 22  0 - 37 U/L    ALT 13  0 - 35 U/L    Alkaline Phosphatase 57  39 - 117 U/L    Total Bilirubin 2.6 (*) 0.3 - 1.2 mg/dL    GFR calc non Af Amer >90  >90 mL/min    GFR calc Af Amer >90  >90 mL/min     Studies/Results: No results found.  Patient Active Problem List  Diagnosis  . SICKLE CELL ANEMIA  . TRICHOTILLOMANIA  . Active smoker  . Back pain  . Depression  . GERD (gastroesophageal reflux disease)  . Contraception management  . Stress  . Overweight  . Vaso-occlusive sickle cell crisis  . Right thigh pain  . Vaginosis  . Tachycardia  . Headache  . Nausea & vomiting    Impression: Sickle cell crisis. IV pain control still needed. Active hemolysis. This is gradually subsiding. Situational stress. History tobacco abuse. Gastroesophageal reflux disease.   Plan: K pad and adjunctive measures. Continue IV fluids and IV analgesia. Followup counseling as appropriate. Consider exchange transfusion if recovery plateaus. CBC, CMET in a.m.   [name] 03/07/2012 6:00 PM

## 2012-03-08 ENCOUNTER — Inpatient Hospital Stay (HOSPITAL_COMMUNITY): Payer: Medicaid Other

## 2012-03-08 LAB — CBC WITH DIFFERENTIAL/PLATELET
Basophils Absolute: 0.2 10*3/uL — ABNORMAL HIGH (ref 0.0–0.1)
Basophils Relative: 1 % (ref 0–1)
Eosinophils Absolute: 0.6 10*3/uL (ref 0.0–0.7)
Eosinophils Relative: 4 % (ref 0–5)
Hemoglobin: 8.4 g/dL — ABNORMAL LOW (ref 12.0–15.0)
MCH: 32.6 pg (ref 26.0–34.0)
MCV: 95.3 fL (ref 78.0–100.0)
Metamyelocytes Relative: 0 %
Monocytes Absolute: 2 10*3/uL — ABNORMAL HIGH (ref 0.1–1.0)
Monocytes Relative: 13 % — ABNORMAL HIGH (ref 3–12)
Myelocytes: 0 %
Neutro Abs: 9 10*3/uL — ABNORMAL HIGH (ref 1.7–7.7)
Neutrophils Relative %: 60 % (ref 43–77)
Platelets: 534 10*3/uL — ABNORMAL HIGH (ref 150–400)
RBC: 2.58 MIL/uL — ABNORMAL LOW (ref 3.87–5.11)
WBC: 15.1 10*3/uL — ABNORMAL HIGH (ref 4.0–10.5)
nRBC: 0 /100 WBC

## 2012-03-08 LAB — COMPREHENSIVE METABOLIC PANEL
Albumin: 4.1 g/dL (ref 3.5–5.2)
BUN: 6 mg/dL (ref 6–23)
Calcium: 9.4 mg/dL (ref 8.4–10.5)
GFR calc Af Amer: >90 mL/min (ref >90)
Glucose, Bld: 97 mg/dL (ref 70–99)
Total Protein: 7.6 g/dL (ref 6.0–8.3)

## 2012-03-08 MED ORDER — VITAMIN D3 25 MCG (1000 UNIT) PO TABS
1000.0000 [IU] | ORAL_TABLET | Freq: Every morning | ORAL | Status: DC
  Administered 2012-03-09 – 2012-03-15 (×7): 1000 [IU] via ORAL
  Filled 2012-03-08 (×7): qty 1

## 2012-03-08 MED ORDER — IBUPROFEN 600 MG PO TABS
600.0000 mg | ORAL_TABLET | Freq: Three times a day (TID) | ORAL | Status: DC
  Administered 2012-03-08 – 2012-03-15 (×13): 600 mg via ORAL
  Filled 2012-03-08 (×23): qty 1

## 2012-03-08 MED ORDER — SODIUM CHLORIDE 0.9 % IJ SOLN
10.0000 mL | INTRAMUSCULAR | Status: DC | PRN
  Administered 2012-03-10 – 2012-03-13 (×8): 10 mL
  Administered 2012-03-15: 20 mL
  Administered 2012-03-15: 10 mL

## 2012-03-08 MED ORDER — PANTOPRAZOLE SODIUM 40 MG PO TBEC
40.0000 mg | DELAYED_RELEASE_TABLET | Freq: Two times a day (BID) | ORAL | Status: DC
  Administered 2012-03-08 – 2012-03-15 (×14): 40 mg via ORAL
  Filled 2012-03-08 (×14): qty 1

## 2012-03-08 NOTE — Discharge Summary (Signed)
Physician Discharge Summary  Patient ID: [name] MRN: [id number] DOB/AGE: 06-02-1990 21 y.o.  Admit date: 02/12/2012 Discharge date: 02/16/2012   Discharge Diagnoses:  Active Problems:  GERD (gastroesophageal reflux disease)  Vaso-occlusive sickle cell crisis  Headache  Nausea & vomiting  Sickle cell crisis. 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 Gastroesophageal reflux disease. Situational stress. Migraine headache. Discharged Condition: Improved  Operations/Procedues: PICC line placement   Hospital Course: Patient was admitted for further treatment of her sickle cell crisis. She was started on IV fluids with hypotonic saline. She was placed on IV analgesia consisting of Dilaudid. She was also maintained on incentive spirometry and antiemetics. Patient acknowledges ongoing stressors. This was addressed during the hospital stay briefly as well. After several days of therapy patient was feeling better. She had a flareup of her migraine headache 1 day prior to discharge. She subsequently went home improved. Patient did not require transfusion.   Significant Diagnostic Studies: None Disposition: 01-Home or Self Care  Discharge Orders    Future Orders Please Complete By Expires   Diet - low sodium heart healthy      Increase activity slowly      No wound care      Call MD for:  severe uncontrolled pain      Discharge instructions      Comments:   Follow up with primary care physician in one week.       Medication List     As of 03/08/2012  1:16 AM    TAKE these medications         hydroxyurea 500 MG capsule   Commonly known as: HYDREA   Take 1,000 mg by mouth daily. May take with food to minimize GI side effects.      oxyCODONE 15 MG immediate release tablet   Commonly known as: ROXICODONE   Take 1 tablet (15 mg total) by mouth every 4 (four) hours as needed. For pain      pantoprazole 40 MG tablet   Commonly known as: PROTONIX   Take 1 tablet (40 mg  total) by mouth daily at 12 noon.           Follow-up Information    Follow up with August Saucer, Kaloni Bisaillon, MD. Schedule an appointment as soon as possible for a visit in 1 day.   Contact information:   [address] [place] [address] [phone]          Signed: August Saucer, Timon Geissinger 03/08/2012, 1:16 AM

## 2012-03-08 NOTE — Progress Notes (Signed)
Per IV team RN  PICC is ok to use.

## 2012-03-08 NOTE — ED Provider Notes (Signed)
Medical screening examination/treatment/procedure(s) were performed by non-physician practitioner and as supervising physician I was immediately available for consultation/collaboration.   Pebbles Zeiders, MD 03/08/12 1527 

## 2012-03-08 NOTE — Progress Notes (Signed)
Subjective:  Patient continues to complain of not feeling well. Still has diffuse arthralgias in the lower back and right buttock region. She rates her pain as an 8/10. This been no chest pains or shortness of breath. She denies cough abdominal pain or dysuria. Denies any recent increased stressors. States present medications been helping. She has not been on any anti-inflammatory agents at home. She states the ibuprofen has not helped in the past. She's not been on any psychotropic agents recently. She is presently in school.   Allergies  Allergen Reactions  . Carrot Oil Hives and Swelling  . Latex Rash  . Toradol (Ketorolac Tromethamine) Hives and Itching   Current Facility-Administered Medications  Medication Dose Route Frequency Provider Last Rate Last Dose  . 0.45 % NaCl with KCl 20 mEq / L infusion   Intravenous Continuous Gwenyth Bender, MD 100 mL/hr at 03/07/12 1946    . docusate sodium (COLACE) capsule 100 mg  100 mg Oral BID Houston Siren, MD   100 mg at 03/07/12 1000  . enoxaparin (LOVENOX) injection 40 mg  40 mg Subcutaneous Daily Houston Siren, MD   40 mg at 03/07/12 1000  . folic acid (FOLVITE) tablet 1 mg  1 mg Oral Daily Houston Siren, MD   1 mg at 03/08/12 1610  . HYDROmorphone (DILAUDID) injection 3 mg  3 mg Intravenous Q2H PRN Gwenyth Bender, MD   3 mg at 03/08/12 1439  . hydroxyurea (HYDREA) capsule 1,000 mg  1,000 mg Oral Daily Houston Siren, MD   1,000 mg at 03/08/12 9604  . ibuprofen (ADVIL,MOTRIN) tablet 600 mg  600 mg Oral TID Gwenyth Bender, MD      . ondansetron [facility]) tablet 4 mg  4 mg Oral Q6H PRN Houston Siren, MD       Or  . ondansetron [facility]) injection 4 mg  4 mg Intravenous Q6H PRN Houston Siren, MD   4 mg at 03/05/12 1952  . oxyCODONE (Oxy IR/ROXICODONE) immediate release tablet 15 mg  15 mg Oral Q4H PRN Houston Siren, MD   10 mg at 03/06/12 0036  . pantoprazole (PROTONIX) EC tablet 40 mg  40 mg Oral Q1200 Houston Siren, MD   40 mg at 03/07/12 1110  . DISCONTD: 0.9 %  sodium chloride infusion  250 mL  Intravenous PRN Arthor Captain, PA-C 10 mL/hr at 03/03/12 2044 250 mL at 03/03/12 2044  . DISCONTD: 0.9 %  sodium chloride infusion  1,000 mL Intravenous Continuous Houston Siren, MD 125 mL/hr at 03/05/12 1212 1,000 mL at 03/05/12 1212  . DISCONTD: HYDROmorphone (DILAUDID) injection 2 mg  2 mg Intravenous Once Houston Siren, MD        Objective: Blood pressure 110/59, pulse 102, temperature 99 F (37.2 C), temperature source Oral, resp. rate 18, height 5\' 2"  (1.575 m), weight 194 lb 10.7 oz (88.3 kg), last menstrual period 02/08/2012, SpO2 96.00%.  Well-developed overweight black female in mild distress. Requesting pain medication. HEENT: No sinus tenderness. Positive sclera icterus. NECK: No enlarged thyroid. No posterior cervical nodes. LUNGS: Clear to auscultation. No CVA tenderness. CV: Normal S1, S2 without S3. ABD: No epigastric tenderness. MSK: Tender left hip. Negative tenderness right hip. Tenderness the knees. Negative Homans. NEURO: Intact.  Lab results: Results for orders placed during the hospital encounter of 03/03/12 (from the past 48 hour(s))  TYPE AND SCREEN     Status: Normal   Collection Time   03/06/12  8:00 PM      Component Value  Range Comment   ABO/RH(D) B POS      Antibody Screen POS      Sample Expiration 03/09/2012      Antibody Identification NO CLINICALLY SIGNIFICANT ANTIBODY IDENTIFIED.      DAT, IgG NEG     CBC WITH DIFFERENTIAL     Status: Abnormal   Collection Time   03/07/12  6:45 AM      Component Value Range Comment   WBC 13.8 (*) 4.0 - 10.5 K/uL    RBC 2.45 (*) 3.87 - 5.11 MIL/uL    Hemoglobin 7.8 (*) 12.0 - 15.0 g/dL    HCT 62.9 (*) 52.8 - 46.0 %    MCV 93.1  78.0 - 100.0 fL    MCH 31.8  26.0 - 34.0 pg    MCHC 34.2  30.0 - 36.0 g/dL    RDW 41.3 (*) 24.4 - 15.5 %    Platelets 422 (*) 150 - 400 K/uL PLATELET COUNT CONFIRMED BY SMEAR   Neutrophils Relative 63  43 - 77 %    Lymphocytes Relative 26  12 - 46 %    Monocytes Relative 5  3 - 12 %     Eosinophils Relative 4  0 - 5 %    Basophils Relative 1  0 - 1 %    Band Neutrophils 0  0 - 10 %    Metamyelocytes Relative 0      Myelocytes 1      Promyelocytes Absolute 0      Blasts 0      nRBC 0  0 /100 WBC    RBC Morphology MARKED POLYCHROMASIA      WBC Morphology MILD LEFT SHIFT (1-5% METAS, OCC MYELO, OCC BANDS)      Smear Review LARGE PLATELETS PRESENT      Neutro Abs 8.8 (*) 1.7 - 7.7 K/uL    Lymphs Abs 3.6  0.7 - 4.0 K/uL    Monocytes Absolute 0.7  0.1 - 1.0 K/uL    Eosinophils Absolute 0.6  0.0 - 0.7 K/uL    Basophils Absolute 0.1  0.0 - 0.1 K/uL   COMPREHENSIVE METABOLIC PANEL     Status: Abnormal   Collection Time   03/07/12  6:45 AM      Component Value Range Comment   Sodium 136  135 - 145 mEq/L    Potassium 3.8  3.5 - 5.1 mEq/L    Chloride 101  96 - 112 mEq/L    CO2 25  19 - 32 mEq/L    Glucose, Bld 95  70 - 99 mg/dL    BUN 6  6 - 23 mg/dL    Creatinine, Ser 0.10  0.50 - 1.10 mg/dL    Calcium 9.2  8.4 - 27.2 mg/dL    Total Protein 7.1  6.0 - 8.3 g/dL    Albumin 3.8  3.5 - 5.2 g/dL    AST 22  0 - 37 U/L    ALT 13  0 - 35 U/L    Alkaline Phosphatase 57  39 - 117 U/L    Total Bilirubin 2.6 (*) 0.3 - 1.2 mg/dL    GFR calc non Af Amer >90  >90 mL/min    GFR calc Af Amer >90  >90 mL/min   CBC WITH DIFFERENTIAL     Status: Abnormal   Collection Time   03/08/12  5:52 AM      Component Value Range Comment   WBC 15.1 (*) 4.0 - 10.5 K/uL  ADJUSTED FOR NUCLEATED RBC'S   RBC 2.58 (*) 3.87 - 5.11 MIL/uL    Hemoglobin 8.4 (*) 12.0 - 15.0 g/dL    HCT 16.1 (*) 09.6 - 46.0 %    MCV 95.3  78.0 - 100.0 fL    MCH 32.6  26.0 - 34.0 pg    MCHC 34.1  30.0 - 36.0 g/dL    RDW 04.5 (*) 40.9 - 15.5 %    Platelets 534 (*) 150 - 400 K/uL    Neutrophils Relative 60  43 - 77 %    Lymphocytes Relative 22  12 - 46 %    Monocytes Relative 13 (*) 3 - 12 %    Eosinophils Relative 4  0 - 5 %    Basophils Relative 1  0 - 1 %    Band Neutrophils 0  0 - 10 %    Metamyelocytes Relative 0       Myelocytes 0      Promyelocytes Absolute 0      Blasts 0      nRBC 0  0 /100 WBC    Neutro Abs 9.0 (*) 1.7 - 7.7 K/uL    Lymphs Abs 3.3  0.7 - 4.0 K/uL    Monocytes Absolute 2.0 (*) 0.1 - 1.0 K/uL    Eosinophils Absolute 0.6  0.0 - 0.7 K/uL    Basophils Absolute 0.2 (*) 0.0 - 0.1 K/uL    RBC Morphology TARGET CELLS     COMPREHENSIVE METABOLIC PANEL     Status: Abnormal   Collection Time   03/08/12  5:52 AM      Component Value Range Comment   Sodium 137  135 - 145 mEq/L    Potassium 3.9  3.5 - 5.1 mEq/L    Chloride 101  96 - 112 mEq/L    CO2 24  19 - 32 mEq/L    Glucose, Bld 97  70 - 99 mg/dL    BUN 6  6 - 23 mg/dL    Creatinine, Ser 8.11  0.50 - 1.10 mg/dL    Calcium 9.4  8.4 - 91.4 mg/dL    Total Protein 7.6  6.0 - 8.3 g/dL    Albumin 4.1  3.5 - 5.2 g/dL    AST 23  0 - 37 U/L    ALT 15  0 - 35 U/L    Alkaline Phosphatase 63  39 - 117 U/L    Total Bilirubin 2.4 (*) 0.3 - 1.2 mg/dL    GFR calc non Af Amer >90  >90 mL/min    GFR calc Af Amer >90  >90 mL/min     Studies/Results: No results found.  Patient Active Problem List  Diagnosis  . SICKLE CELL ANEMIA  . TRICHOTILLOMANIA  . Active smoker  . Back pain  . Depression  . GERD (gastroesophageal reflux disease)  . Contraception management  . Stress  . Overweight  . Vaso-occlusive sickle cell crisis  . Right thigh pain  . Vaginosis  . Tachycardia  . Headache  . Nausea & vomiting    Impression: Sickle cell crisis. Slowly resolving. Adverse effect of weather change. Active hemolysis continues. 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 Anxiety disorder. Tobacco abuse. History of gastroesophageal reflux.   Plan: Continue present IV fluids and IV analgesia. NSAID therapy plus protonic. Recheck CBC in a.m. Exchange transfusion if active hemolysis does not improve. She will at that time need a PICC line.   August Saucer, [name] 03/08/2012  5:05 PM

## 2012-03-08 NOTE — Progress Notes (Signed)
Peripherally Inserted Central Catheter/Midline Placement  The IV Nurse has discussed with the patient and/or persons authorized to consent for the patient, the purpose of this procedure and the potential benefits and risks involved with this procedure.  The benefits include less needle sticks, lab draws from the catheter and patient may be discharged home with the catheter.  Risks include, but not limited to, infection, bleeding, blood clot (thrombus formation), and puncture of an artery; nerve damage and irregular heat beat.  Alternatives to this procedure were also discussed.  PICC/Midline Placement Documentation        Stacie Glaze Horton 03/08/2012, 8:06 PM

## 2012-03-09 LAB — COMPREHENSIVE METABOLIC PANEL
ALT: 20 U/L (ref 0–35)
AST: 24 U/L (ref 0–37)
Albumin: 4.1 g/dL (ref 3.5–5.2)
Alkaline Phosphatase: 63 U/L (ref 39–117)
BUN: 7 mg/dL (ref 6–23)
CO2: 25 mEq/L (ref 19–32)
Calcium: 9.4 mg/dL (ref 8.4–10.5)
Chloride: 99 mEq/L (ref 96–112)
Creatinine, Ser: 0.61 mg/dL (ref 0.50–1.10)
GFR calc Af Amer: >90 mL/min (ref >90)
GFR calc non Af Amer: >90 mL/min (ref >90)
Glucose, Bld: 107 mg/dL — ABNORMAL HIGH (ref 70–99)
Potassium: 4.3 mEq/L (ref 3.5–5.1)
Sodium: 135 mEq/L (ref 135–145)
Total Bilirubin: 2.4 mg/dL — ABNORMAL HIGH (ref 0.3–1.2)
Total Protein: 7.6 g/dL (ref 6.0–8.3)

## 2012-03-09 LAB — CBC WITH DIFFERENTIAL/PLATELET
Band Neutrophils: 0 % (ref 0–10)
Basophils Relative: 0 % (ref 0–1)
Blasts: 0 %
Eosinophils Absolute: 0.6 10*3/uL (ref 0.0–0.7)
Eosinophils Relative: 4 % (ref 0–5)
HCT: 24.4 % — ABNORMAL LOW (ref 36.0–46.0)
Hemoglobin: 8.4 g/dL — ABNORMAL LOW (ref 12.0–15.0)
Lymphocytes Relative: 23 % (ref 12–46)
Lymphs Abs: 3.5 10*3/uL (ref 0.7–4.0)
MCV: 94.6 fL (ref 78.0–100.0)
Monocytes Absolute: 1.8 10*3/uL — ABNORMAL HIGH (ref 0.1–1.0)
Monocytes Relative: 12 % (ref 3–12)
RBC: 2.58 MIL/uL — ABNORMAL LOW (ref 3.87–5.11)
RDW: 22.1 % — ABNORMAL HIGH (ref 11.5–15.5)
WBC: 15.3 10*3/uL — ABNORMAL HIGH (ref 4.0–10.5)

## 2012-03-09 MED ORDER — ACETAMINOPHEN 325 MG PO TABS
650.0000 mg | ORAL_TABLET | Freq: Once | ORAL | Status: AC
  Administered 2012-03-10: 650 mg via ORAL
  Filled 2012-03-09: qty 2

## 2012-03-09 MED ORDER — DIPHENHYDRAMINE HCL 50 MG/ML IJ SOLN
25.0000 mg | Freq: Once | INTRAMUSCULAR | Status: AC
  Administered 2012-03-10: 25 mg via INTRAVENOUS
  Filled 2012-03-09: qty 1

## 2012-03-09 NOTE — Progress Notes (Signed)
Subjective:  Patient initially reported that she was ready to go home. By this afternoon she was complaining of her pain being 8/10 again. Question the patient regarding any further factors exacerbating her pain. She denied any. She presently feels she is not in stable to manage her pain with oral medications still. Discussed with her the need to proceed with exchange transfusions. She had a PICC line placed yesterday due to poor access.   Allergies  Allergen Reactions  . Carrot Oil Hives and Swelling  . Latex Rash  . Toradol (Ketorolac Tromethamine) Hives and Itching   Current Facility-Administered Medications  Medication Dose Route Frequency Provider Last Rate Last Dose  . 0.45 % NaCl with KCl 20 mEq / L infusion   Intravenous Continuous Gwenyth Bender, MD 75 mL/hr at 03/09/12 [phone]    . acetaminophen (TYLENOL) tablet 650 mg  650 mg Oral Once Gwenyth Bender, MD      . cholecalciferol (VITAMIN D) tablet 1,000 Units  1,000 Units Oral q morning - 10a Gwenyth Bender, MD   1,000 Units at 03/09/12 [phone]  . diphenhydrAMINE (BENADRYL) injection 25 mg  25 mg Intravenous Once Gwenyth Bender, MD      . docusate sodium (COLACE) capsule 100 mg  100 mg Oral BID Houston Siren, MD   100 mg at 03/09/12 0934  . enoxaparin (LOVENOX) injection 40 mg  40 mg Subcutaneous Daily Houston Siren, MD   40 mg at 03/07/12 1000  . folic acid (FOLVITE) tablet 1 mg  1 mg Oral Daily Houston Siren, MD   1 mg at 03/09/12 0934  . HYDROmorphone (DILAUDID) injection 3 mg  3 mg Intravenous Q2H PRN Gwenyth Bender, MD   3 mg at 03/09/12 1956  . hydroxyurea (HYDREA) capsule 1,000 mg  1,000 mg Oral Daily Houston Siren, MD   1,000 mg at 03/09/12 0935  . ibuprofen (ADVIL,MOTRIN) tablet 600 mg  600 mg Oral TID Gwenyth Bender, MD   600 mg at 03/09/12 1532  . ondansetron (ZOFRAN) tablet 4 mg  4 mg Oral Q6H PRN Houston Siren, MD       Or  . ondansetron [facility]) injection 4 mg  4 mg Intravenous Q6H PRN Houston Siren, MD   4 mg at 03/05/12 1952  . oxyCODONE (Oxy IR/ROXICODONE) immediate  release tablet 15 mg  15 mg Oral Q4H PRN Houston Siren, MD   10 mg at 03/06/12 0036  . pantoprazole (PROTONIX) EC tablet 40 mg  40 mg Oral BID AC Gwenyth Bender, MD   40 mg at 03/09/12 1755  . sodium chloride 0.9 % injection 10-40 mL  10-40 mL Intracatheter PRN Gwenyth Bender, MD        Objective: Blood pressure 121/69, pulse 102, temperature 98.3 F (36.8 C), temperature source Oral, resp. rate 20, height 5\' 2"  (1.575 m), weight 194 lb 10.7 oz (88.3 kg), last menstrual period 02/08/2012, SpO2 97.00%, not currently breastfeeding.  Well-developed depressed-appearing black female in no acute distress however. HEENT:no sinus tenderness. No sclera icterus. NECK:no enlarged thyroid. No posterior cervical nodes. LUNGS:decreased breath sounds at bases. No wheezes. No vocal fremitus. No CVA tenderness. VW:UJWJXB S1, S2 without S3. ABD:no epigastric tenderness. JYN:WGNFAOZHYQ in lower back and right paraspinal muscles. Minimal tenderness in right hip. NEURO:nonfocal.  Lab results: Results for orders placed during the hospital encounter of 03/03/12 (from the past 48 hour(s))  CBC WITH DIFFERENTIAL     Status: Abnormal   Collection Time   03/08/12  5:52 AM      Component Value Range Comment   WBC 15.1 (*) 4.0 - 10.5 K/uL ADJUSTED FOR NUCLEATED RBC'S   RBC 2.58 (*) 3.87 - 5.11 MIL/uL    Hemoglobin 8.4 (*) 12.0 - 15.0 g/dL    HCT 40.9 (*) 81.1 - 46.0 %    MCV 95.3  78.0 - 100.0 fL    MCH 32.6  26.0 - 34.0 pg    MCHC 34.1  30.0 - 36.0 g/dL    RDW 91.4 (*) 78.2 - 15.5 %    Platelets 534 (*) 150 - 400 K/uL    Neutrophils Relative 60  43 - 77 %    Lymphocytes Relative 22  12 - 46 %    Monocytes Relative 13 (*) 3 - 12 %    Eosinophils Relative 4  0 - 5 %    Basophils Relative 1  0 - 1 %    Band Neutrophils 0  0 - 10 %    Metamyelocytes Relative 0      Myelocytes 0      Promyelocytes Absolute 0      Blasts 0      nRBC 0  0 /100 WBC    Neutro Abs 9.0 (*) 1.7 - 7.7 K/uL    Lymphs Abs 3.3  0.7 - 4.0 K/uL     Monocytes Absolute 2.0 (*) 0.1 - 1.0 K/uL    Eosinophils Absolute 0.6  0.0 - 0.7 K/uL    Basophils Absolute 0.2 (*) 0.0 - 0.1 K/uL    RBC Morphology TARGET CELLS     COMPREHENSIVE METABOLIC PANEL     Status: Abnormal   Collection Time   03/08/12  5:52 AM      Component Value Range Comment   Sodium 137  135 - 145 mEq/L    Potassium 3.9  3.5 - 5.1 mEq/L    Chloride 101  96 - 112 mEq/L    CO2 24  19 - 32 mEq/L    Glucose, Bld 97  70 - 99 mg/dL    BUN 6  6 - 23 mg/dL    Creatinine, Ser 9.56  0.50 - 1.10 mg/dL    Calcium 9.4  8.4 - 21.3 mg/dL    Total Protein 7.6  6.0 - 8.3 g/dL    Albumin 4.1  3.5 - 5.2 g/dL    AST 23  0 - 37 U/L    ALT 15  0 - 35 U/L    Alkaline Phosphatase 63  39 - 117 U/L    Total Bilirubin 2.4 (*) 0.3 - 1.2 mg/dL    GFR calc non Af Amer >90  >90 mL/min    GFR calc Af Amer >90  >90 mL/min   CBC WITH DIFFERENTIAL     Status: Abnormal   Collection Time   03/09/12  5:00 AM      Component Value Range Comment   WBC 15.3 (*) 4.0 - 10.5 K/uL ADJUSTED FOR NUCLEATED RBC'S   RBC 2.58 (*) 3.87 - 5.11 MIL/uL    Hemoglobin 8.4 (*) 12.0 - 15.0 g/dL    HCT 08.6 (*) 57.8 - 46.0 %    MCV 94.6  78.0 - 100.0 fL    MCH 32.6  26.0 - 34.0 pg    MCHC 34.4  30.0 - 36.0 g/dL    RDW 46.9 (*) 62.9 - 15.5 %    Platelets 424 (*) 150 - 400 K/uL    Neutrophils Relative 61  43 -  77 %    Lymphocytes Relative 23  12 - 46 %    Monocytes Relative 12  3 - 12 %    Eosinophils Relative 4  0 - 5 %    Basophils Relative 0  0 - 1 %    Band Neutrophils 0  0 - 10 %    Metamyelocytes Relative 0      Myelocytes 0      Promyelocytes Absolute 0      Blasts 0      nRBC 0  0 /100 WBC    Neutro Abs 9.4 (*) 1.7 - 7.7 K/uL    Lymphs Abs 3.5  0.7 - 4.0 K/uL    Monocytes Absolute 1.8 (*) 0.1 - 1.0 K/uL    Eosinophils Absolute 0.6  0.0 - 0.7 K/uL    Basophils Absolute 0.0  0.0 - 0.1 K/uL    RBC Morphology POLYCHROMASIA PRESENT     COMPREHENSIVE METABOLIC PANEL     Status: Abnormal   Collection  Time   03/09/12  5:00 AM      Component Value Range Comment   Sodium 135  135 - 145 mEq/L    Potassium 4.3  3.5 - 5.1 mEq/L    Chloride 99  96 - 112 mEq/L    CO2 25  19 - 32 mEq/L    Glucose, Bld 107 (*) 70 - 99 mg/dL    BUN 7  6 - 23 mg/dL    Creatinine, Ser 1.61  0.50 - 1.10 mg/dL    Calcium 9.4  8.4 - 09.6 mg/dL    Total Protein 7.6  6.0 - 8.3 g/dL    Albumin 4.1  3.5 - 5.2 g/dL    AST 24  0 - 37 U/L    ALT 20  0 - 35 U/L    Alkaline Phosphatase 63  39 - 117 U/L    Total Bilirubin 2.4 (*) 0.3 - 1.2 mg/dL    GFR calc non Af Amer >90  >90 mL/min    GFR calc Af Amer >90  >90 mL/min     Studies/Results: Dg Chest Port 1 View  03/08/2012  *RADIOLOGY REPORT*  Clinical Data: PICC line placement.  PORTABLE CHEST - 1 VIEW  Comparison: Single view chest 03/08/2012 at 08:08 p.m.  Findings: The PICC line has been repositioned.  The tip is now in the proximal SVC, at the level of the azygos vein.  The heart size is normal.  Lungs are clear.  IMPRESSION:  1.  Repositioning of the left-sided PICC line with the tip in the proximal SVC.   Original Report Authenticated By: Jamesetta Orleans. MATTERN, M.D.    Dg Chest Port 1 View  03/08/2012  *RADIOLOGY REPORT*  Clinical Data: Confirm PICC line placement  PORTABLE CHEST - 1 VIEW  Comparison: 01/08/2012  Findings:  Grossly unchanged cardiac silhouette and mediastinal contours.  No focal parenchymal opacities.  There is persistent mild elevation of the right hemidiaphragm.  No definite pleural effusion or pneumothorax.  Interval placement of left upper extremity approach PICC line with tip coursing up the expected location of the left internal jugular vein, tip excluded from view.  IMPRESSION:  Malpositioned left upper extremity approach PICC line with tip coursing up the expected location of the left internal jugular vein, tip excluded from view.  Repositioning is advised.  This was made a call report.   Original Report Authenticated By: Waynard Reeds, M.D.      Patient Active  Problem List  Diagnosis  . SICKLE CELL ANEMIA  . TRICHOTILLOMANIA  . Active smoker  . Back pain  . Depression  . GERD (gastroesophageal reflux disease)  . Contraception management  . Stress  . Overweight  . Vaso-occlusive sickle cell crisis  . Right thigh pain  . Vaginosis  . Tachycardia  . Headache  . Nausea & vomiting    Impression: Sickle cell crisis persistent. Active hemolysis gradually decreasing. Leukocytosis secondary to #1. Situational stress of questionable degree. Depression. History of tobacco abuse abstaining. History of gastroesophageal reflux disease.   Plan: Proceed with exchange transfusion. Gradually taper IV Dilaudid as tolerated. Progress toward discharge home soon.   August Saucer, Jazsmine Macari 03/09/2012 9:15 PM

## 2012-03-10 LAB — COMPREHENSIVE METABOLIC PANEL
ALT: 21 U/L (ref 0–35)
Albumin: 3.7 g/dL (ref 3.5–5.2)
Alkaline Phosphatase: 60 U/L (ref 39–117)
Potassium: 4 mEq/L (ref 3.5–5.1)
Sodium: 137 mEq/L (ref 135–145)
Total Protein: 7 g/dL (ref 6.0–8.3)

## 2012-03-10 LAB — CBC WITH DIFFERENTIAL/PLATELET
Basophils Absolute: 0 10*3/uL (ref 0.0–0.1)
Basophils Relative: 0 % (ref 0–1)
Eosinophils Absolute: 1.7 10*3/uL — ABNORMAL HIGH (ref 0.0–0.7)
Eosinophils Relative: 12 % — ABNORMAL HIGH (ref 0–5)
Hemoglobin: 8.1 g/dL — ABNORMAL LOW (ref 12.0–15.0)
MCH: 32.8 pg (ref 26.0–34.0)
MCV: 93.5 fL (ref 78.0–100.0)
Metamyelocytes Relative: 0 %
Myelocytes: 0 %
RBC: 2.47 MIL/uL — ABNORMAL LOW (ref 3.87–5.11)

## 2012-03-10 NOTE — Progress Notes (Signed)
Subjective:  Feeling better. Resting comfortably. Afebrile.  Objective:  Vital Signs in the last 24 hours: Temp:  [97.9 F (36.6 C)-98.3 F (36.8 C)] 97.9 F (36.6 C) (10/26 0637) Pulse Rate:  [100-102] 100  (10/26 0637) Cardiac Rhythm:  [-]  Resp:  [18-20] 18  (10/26 0637) BP: (111-127)/(63-69) 127/66 mmHg (10/26 0637) SpO2:  [97 %-98 %] 98 % (10/26 1478)  Physical Exam: BP Readings from Last 1 Encounters:  03/10/12 127/66    Wt Readings from Last 1 Encounters:  03/04/12 88.3 kg (194 lb 10.7 oz)    Weight change:   HEENT: [place]/AT, Eyes-Brown, PERL, EOMI, Conjunctiva-Pale, Sclera-Non-icteric Neck: No JVD, No bruit, Trachea midline. Lungs:  Clear, Bilateral. Cardiac:  Regular rhythm, normal S1 and S2, no S3.  Abdomen:  Soft, non-tender. Extremities:  No edema present. No cyanosis. No clubbing. CNS: AxOx3, Cranial nerves grossly intact. Right handed. Skin: Warm and dry.   Intake/Output from previous day: 10/25 0701 - 10/26 0700 In: 1575 [P.O.:600; I.V.:975] Out: -     Lab Results: BMET    Component Value Date/Time   NA 137 03/10/2012 0545   K 4.0 03/10/2012 0545   CL 104 03/10/2012 0545   CO2 25 03/10/2012 0545   GLUCOSE 97 03/10/2012 0545   BUN 7 03/10/2012 0545   CREATININE 0.59 03/10/2012 0545   CREATININE 0.59 06/13/2011 1142   CALCIUM 8.9 03/10/2012 0545   GFRNONAA >90 03/10/2012 0545   GFRAA >90 03/10/2012 0545   CBC    Component Value Date/Time   WBC 14.4* 03/10/2012 0545   RBC 2.47* 03/10/2012 0545   HGB 8.1* 03/10/2012 0545   HCT 23.1* 03/10/2012 0545   PLT 425* 03/10/2012 0545   MCV 93.5 03/10/2012 0545   MCH 32.8 03/10/2012 0545   MCHC 35.1 03/10/2012 0545   RDW 20.9* 03/10/2012 0545   LYMPHSABS 4.3* 03/10/2012 0545   MONOABS 0.9 03/10/2012 0545   EOSABS 1.7* 03/10/2012 0545   BASOSABS 0.0 03/10/2012 0545   CARDIAC ENZYMES Lab Results  Component Value Date   CKTOTAL 55 01/08/2012   CKMB 1.1 12/17/2011   TROPONINI <0.30 12/17/2011     Assessment/Plan:  Patient Active Hospital Problem List: Vaso-occlusive sickle cell crisis (10/08/2011) SICKLE CELL ANEMIA (01/08/2009) Active smoker (08/09/2010) Back pain (09/17/2010) Depression (01/06/2011) Overweight (05/24/2011)  Exchange transfusion. Nurse notified around [place].    LOS: 7 days    Orpah Cobb  MD  03/10/2012, 12:46 PM

## 2012-03-10 NOTE — Progress Notes (Signed)
2320 Dr. August Saucer paged to clarify blood transfusion order to remove before transfusion of 1unit of PRBCS. 2350 Dr. Diamantina Providence answering service notified that Dr. August Saucer had not return his page. The answering service stated he is now not on call Dr. Algie Coffer is on call. Dr. Algie Coffer paged. 2353 Dr. Algie Coffer return his page and told of blood transfusion order Dr. Algie Coffer stated to hold blood transfusion until am so the order could be clarified.

## 2012-03-10 NOTE — Progress Notes (Signed)
Dr. Algie Coffer to contact Dr. August Saucer to clarify actually what to remove, plasma, rbcs, etc. Will place order after he speaks to him.

## 2012-03-11 LAB — COMPREHENSIVE METABOLIC PANEL
ALT: 23 U/L (ref 0–35)
Alkaline Phosphatase: 58 U/L (ref 39–117)
CO2: 25 mEq/L (ref 19–32)
Chloride: 104 mEq/L (ref 96–112)
GFR calc Af Amer: >90 mL/min (ref >90)
GFR calc non Af Amer: >90 mL/min (ref >90)
Glucose, Bld: 117 mg/dL — ABNORMAL HIGH (ref 70–99)
Potassium: 3.9 mEq/L (ref 3.5–5.1)
Sodium: 138 mEq/L (ref 135–145)
Total Bilirubin: 2.2 mg/dL — ABNORMAL HIGH (ref 0.3–1.2)

## 2012-03-11 LAB — CBC WITH DIFFERENTIAL/PLATELET
Basophils Relative: 1 % (ref 0–1)
Eosinophils Relative: 8 % — ABNORMAL HIGH (ref 0–5)
Hemoglobin: 8.3 g/dL — ABNORMAL LOW (ref 12.0–15.0)
Lymphocytes Relative: 22 % (ref 12–46)
MCH: 32.3 pg (ref 26.0–34.0)
Monocytes Absolute: 1.2 10*3/uL — ABNORMAL HIGH (ref 0.1–1.0)
Monocytes Relative: 9 % (ref 3–12)
Neutrophils Relative %: 60 % (ref 43–77)
RBC: 2.57 MIL/uL — ABNORMAL LOW (ref 3.87–5.11)

## 2012-03-11 LAB — TYPE AND SCREEN
ABO/RH(D): B POS
DAT, IgG: NEGATIVE
Unit division: 0

## 2012-03-11 NOTE — Progress Notes (Signed)
Subjective:  No chest pain. + back pain. Afebrile.  Objective:  Vital Signs in the last 24 hours: Temp:  [97.6 F (36.4 C)-98.7 F (37.1 C)] 97.6 F (36.4 C) (10/27 0620) Pulse Rate:  [98-116] 111  (10/27 0620) Cardiac Rhythm:  [-]  Resp:  [16-20] 18  (10/27 0620) BP: (98-137)/(56-84) 124/70 mmHg (10/27 0620) SpO2:  [95 %-100 %] 95 % (10/27 9811)  Physical Exam: BP Readings from Last 1 Encounters:  03/11/12 124/70    Wt Readings from Last 1 Encounters:  03/04/12 88.3 kg (194 lb 10.7 oz)    Weight change:   HEENT: [place]/AT, Eyes-Brown, PERL, EOMI, Conjunctiva-Pale, Sclera-Non-icteric Neck: No JVD, No bruit, Trachea midline. Lungs:  Clear, Bilateral. Cardiac:  Regular rhythm, normal S1 and S2, no S3.  Abdomen:  Soft, non-tender. Extremities:  No edema present. No cyanosis. No clubbing. CNS: AxOx3, Cranial nerves grossly intact, moves all 4 extremities. Right handed. Skin: Warm and dry.   Intake/Output from previous day: 10/26 0701 - 10/27 0700 In: 2884.6 [P.O.:1200; I.V.:1334.6; Blood:350] Out: -     Lab Results: BMET    Component Value Date/Time   NA 138 03/11/2012 0500   K 3.9 03/11/2012 0500   CL 104 03/11/2012 0500   CO2 25 03/11/2012 0500   GLUCOSE 117* 03/11/2012 0500   BUN 5* 03/11/2012 0500   CREATININE 0.56 03/11/2012 0500   CREATININE 0.59 06/13/2011 1142   CALCIUM 8.7 03/11/2012 0500   GFRNONAA >90 03/11/2012 0500   GFRAA >90 03/11/2012 0500   CBC    Component Value Date/Time   WBC 13.7* 03/11/2012 0500   RBC 2.57* 03/11/2012 0500   HGB 8.3* 03/11/2012 0500   HCT 23.1* 03/11/2012 0500   PLT 405* 03/11/2012 0500   MCV 89.9 03/11/2012 0500   MCH 32.3 03/11/2012 0500   MCHC 35.9 03/11/2012 0500   RDW 21.2* 03/11/2012 0500   LYMPHSABS 3.0 03/11/2012 0500   MONOABS 1.2* 03/11/2012 0500   EOSABS 1.1* 03/11/2012 0500   BASOSABS 0.1 03/11/2012 0500   CARDIAC ENZYMES Lab Results  Component Value Date   CKTOTAL 55 01/08/2012   CKMB 1.1 12/17/2011   TROPONINI <0.30 12/17/2011    Assessment/Plan:  Patient Active Hospital Problem List:  Vaso-occlusive sickle cell crisis (10/08/2011) SICKLE CELL ANEMIA (01/08/2009) Active smoker (08/09/2010) Back pain (09/17/2010) Depression (01/06/2011) Overweight (05/24/2011)  Continue medical treatment    LOS: 8 days    Orpah Cobb  MD  03/11/2012, 10:18 AM

## 2012-03-12 LAB — CBC WITH DIFFERENTIAL/PLATELET
Basophils Absolute: 0 10*3/uL (ref 0.0–0.1)
Eosinophils Absolute: 1 10*3/uL — ABNORMAL HIGH (ref 0.0–0.7)
Lymphocytes Relative: 19 % (ref 12–46)
MCH: 32.9 pg (ref 26.0–34.0)
MCHC: 35.6 g/dL (ref 30.0–36.0)
Monocytes Absolute: 1.2 10*3/uL — ABNORMAL HIGH (ref 0.1–1.0)
Neutrophils Relative %: 66 % (ref 43–77)
RDW: 21.1 % — ABNORMAL HIGH (ref 11.5–15.5)

## 2012-03-12 LAB — COMPREHENSIVE METABOLIC PANEL
ALT: 19 U/L (ref 0–35)
AST: 22 U/L (ref 0–37)
Calcium: 8.6 mg/dL (ref 8.4–10.5)
Creatinine, Ser: 0.55 mg/dL (ref 0.50–1.10)
GFR calc Af Amer: >90 mL/min (ref >90)
GFR calc non Af Amer: >90 mL/min (ref >90)
Glucose, Bld: 111 mg/dL — ABNORMAL HIGH (ref 70–99)
Sodium: 135 mEq/L (ref 135–145)
Total Protein: 6.4 g/dL (ref 6.0–8.3)

## 2012-03-12 MED ORDER — POTASSIUM CHLORIDE CRYS ER 20 MEQ PO TBCR
40.0000 meq | EXTENDED_RELEASE_TABLET | Freq: Once | ORAL | Status: AC
  Administered 2012-03-12: 40 meq via ORAL
  Filled 2012-03-12: qty 2

## 2012-03-12 NOTE — Progress Notes (Signed)
UR complete 

## 2012-03-13 LAB — COMPREHENSIVE METABOLIC PANEL
Albumin: 3.4 g/dL — ABNORMAL LOW (ref 3.5–5.2)
Alkaline Phosphatase: 57 U/L (ref 39–117)
BUN: 8 mg/dL (ref 6–23)
CO2: 24 mEq/L (ref 19–32)
Chloride: 105 mEq/L (ref 96–112)
Creatinine, Ser: 0.55 mg/dL (ref 0.50–1.10)
GFR calc Af Amer: >90 mL/min (ref >90)
GFR calc non Af Amer: >90 mL/min (ref >90)
Glucose, Bld: 113 mg/dL — ABNORMAL HIGH (ref 70–99)
Potassium: 4 mEq/L (ref 3.5–5.1)
Total Bilirubin: 2 mg/dL — ABNORMAL HIGH (ref 0.3–1.2)

## 2012-03-13 LAB — CBC WITH DIFFERENTIAL/PLATELET
Basophils Relative: 0 % (ref 0–1)
HCT: 24.5 % — ABNORMAL LOW (ref 36.0–46.0)
Hemoglobin: 8.4 g/dL — ABNORMAL LOW (ref 12.0–15.0)
Lymphocytes Relative: 20 % (ref 12–46)
Lymphs Abs: 3.1 10*3/uL (ref 0.7–4.0)
MCHC: 34.3 g/dL (ref 30.0–36.0)
Monocytes Absolute: 1.4 10*3/uL — ABNORMAL HIGH (ref 0.1–1.0)
Monocytes Relative: 9 % (ref 3–12)
Neutro Abs: 10.6 10*3/uL — ABNORMAL HIGH (ref 1.7–7.7)
Neutrophils Relative %: 66 % (ref 43–77)
RBC: 2.68 MIL/uL — ABNORMAL LOW (ref 3.87–5.11)

## 2012-03-13 NOTE — Progress Notes (Addendum)
Subjective:  Patient is complaining of diffuse weakness today. She felt as if her blood count was low. She continues with arthralgias which she relates as a 7-8/10. Denies chest pains or shortness of breath. States she still not able to manage her pain at home with oral medications as of yet. Denies any new stressors at this point she continues to be anxious over school.   Allergies  Allergen Reactions  . Carrot Oil Hives and Swelling  . Latex Rash  . Toradol (Ketorolac Tromethamine) Hives and Itching   Current Facility-Administered Medications  Medication Dose Route Frequency Provider Last Rate Last Dose  . 0.45 % NaCl with KCl 20 mEq / L infusion   Intravenous Continuous Gwenyth Bender, MD 50 mL/hr at 03/12/12 2347    . cholecalciferol (VITAMIN D) tablet 1,000 Units  1,000 Units Oral q morning - 10a Gwenyth Bender, MD   1,000 Units at 03/12/12 1000  . docusate sodium (COLACE) capsule 100 mg  100 mg Oral BID Houston Siren, MD   100 mg at 03/12/12 2303  . enoxaparin (LOVENOX) injection 40 mg  40 mg Subcutaneous Daily Houston Siren, MD   40 mg at 03/07/12 1000  . folic acid (FOLVITE) tablet 1 mg  1 mg Oral Daily Houston Siren, MD   1 mg at 03/12/12 1000  . HYDROmorphone (DILAUDID) injection 3 mg  3 mg Intravenous Q2H PRN Gwenyth Bender, MD   3 mg at 03/12/12 2304  . hydroxyurea (HYDREA) capsule 1,000 mg  1,000 mg Oral Daily Houston Siren, MD   1,000 mg at 03/12/12 1000  . ibuprofen (ADVIL,MOTRIN) tablet 600 mg  600 mg Oral TID Gwenyth Bender, MD   600 mg at 03/12/12 2302  . ondansetron (ZOFRAN) tablet 4 mg  4 mg Oral Q6H PRN Houston Siren, MD       Or  . ondansetron [facility]) injection 4 mg  4 mg Intravenous Q6H PRN Houston Siren, MD   4 mg at 03/12/12 1118  . oxyCODONE (Oxy IR/ROXICODONE) immediate release tablet 15 mg  15 mg Oral Q4H PRN Houston Siren, MD   15 mg at 03/10/12 1345  . pantoprazole (PROTONIX) EC tablet 40 mg  40 mg Oral BID AC Gwenyth Bender, MD   40 mg at 03/12/12 1744  . potassium chloride SA (K-DUR,KLOR-CON) CR tablet 40  mEq  40 mEq Oral Once Gwenyth Bender, MD   40 mEq at 03/12/12 2017  . sodium chloride 0.9 % injection 10-40 mL  10-40 mL Intracatheter PRN Gwenyth Bender, MD   10 mL at 03/12/12 1525    Objective: Blood pressure 119/86, pulse 106, temperature 98.8 F (37.1 C), temperature source Oral, resp. rate 20, height 5\' 2"  (1.575 m), weight 194 lb 10.7 oz (88.3 kg), last menstrual period 02/08/2012, SpO2 100.00%, not currently breastfeeding.  Well-developed well-nourished black female in mild distress. HEENT:no sinus tenderness. No sclera icterus. NECK:no enlarged thyroid. No posterior cervical nodes. LUNGS:clear to auscultation. No CVA tenderness. WU:JWJXBJ S1, S2 without S3. ABD:no epigastric tenderness. YNW:GNFAOZHYQM in lower lumbar sacral spine. Mild tenderness in knees. Negative Hom[place]. NEURO:intact.  Lab results: Results for orders placed during the hospital encounter of 03/03/12 (from the past 48 hour(s))  CBC WITH DIFFERENTIAL     Status: Abnormal   Collection Time   03/11/12  5:00 AM      Component Value Range Comment   WBC 13.7 (*) 4.0 - 10.5 K/uL    RBC 2.57 (*) 3.87 - 5.11  MIL/uL    Hemoglobin 8.3 (*) 12.0 - 15.0 g/dL    HCT 29.5 (*) 62.1 - 46.0 %    MCV 89.9  78.0 - 100.0 fL    MCH 32.3  26.0 - 34.0 pg    MCHC 35.9  30.0 - 36.0 g/dL    RDW 30.8 (*) 65.7 - 15.5 %    Platelets 405 (*) 150 - 400 K/uL    Neutrophils Relative 60  43 - 77 %    Lymphocytes Relative 22  12 - 46 %    Monocytes Relative 9  3 - 12 %    Eosinophils Relative 8 (*) 0 - 5 %    Basophils Relative 1  0 - 1 %    Neutro Abs 8.3 (*) 1.7 - 7.7 K/uL    Lymphs Abs 3.0  0.7 - 4.0 K/uL    Monocytes Absolute 1.2 (*) 0.1 - 1.0 K/uL    Eosinophils Absolute 1.1 (*) 0.0 - 0.7 K/uL    Basophils Absolute 0.1  0.0 - 0.1 K/uL    RBC Morphology TARGET CELLS     COMPREHENSIVE METABOLIC PANEL     Status: Abnormal   Collection Time   03/11/12  5:00 AM      Component Value Range Comment   Sodium 138  135 - 145 mEq/L     Potassium 3.9  3.5 - 5.1 mEq/L    Chloride 104  96 - 112 mEq/L    CO2 25  19 - 32 mEq/L    Glucose, Bld 117 (*) 70 - 99 mg/dL    BUN 5 (*) 6 - 23 mg/dL    Creatinine, Ser 8.46  0.50 - 1.10 mg/dL    Calcium 8.7  8.4 - 96.2 mg/dL    Total Protein 6.5  6.0 - 8.3 g/dL    Albumin 3.5  3.5 - 5.2 g/dL    AST 25  0 - 37 U/L    ALT 23  0 - 35 U/L    Alkaline Phosphatase 58  39 - 117 U/L    Total Bilirubin 2.2 (*) 0.3 - 1.2 mg/dL    GFR calc non Af Amer >90  >90 mL/min    GFR calc Af Amer >90  >90 mL/min   CBC WITH DIFFERENTIAL     Status: Abnormal   Collection Time   03/12/12  5:20 AM      Component Value Range Comment   WBC 14.9 (*) 4.0 - 10.5 K/uL    RBC 2.58 (*) 3.87 - 5.11 MIL/uL    Hemoglobin 8.5 (*) 12.0 - 15.0 g/dL    HCT 95.2 (*) 84.1 - 46.0 %    MCV 92.6  78.0 - 100.0 fL    MCH 32.9  26.0 - 34.0 pg    MCHC 35.6  30.0 - 36.0 g/dL    RDW 32.4 (*) 40.1 - 15.5 %    Platelets 384  150 - 400 K/uL    Neutrophils Relative 66  43 - 77 %    Lymphocytes Relative 19  12 - 46 %    Monocytes Relative 8  3 - 12 %    Eosinophils Relative 7 (*) 0 - 5 %    Basophils Relative 0  0 - 1 %    Neutro Abs 9.9 (*) 1.7 - 7.7 K/uL    Lymphs Abs 2.8  0.7 - 4.0 K/uL    Monocytes Absolute 1.2 (*) 0.1 - 1.0 K/uL    Eosinophils Absolute  1.0 (*) 0.0 - 0.7 K/uL    Basophils Absolute 0.0  0.0 - 0.1 K/uL    RBC Morphology TARGET CELLS      Smear Review LARGE PLATELETS PRESENT     COMPREHENSIVE METABOLIC PANEL     Status: Abnormal   Collection Time   03/12/12  5:20 AM      Component Value Range Comment   Sodium 135  135 - 145 mEq/L    Potassium 3.3 (*) 3.5 - 5.1 mEq/L    Chloride 102  96 - 112 mEq/L    CO2 25  19 - 32 mEq/L    Glucose, Bld 111 (*) 70 - 99 mg/dL    BUN 6  6 - 23 mg/dL    Creatinine, Ser 4.54  0.50 - 1.10 mg/dL    Calcium 8.6  8.4 - 09.8 mg/dL    Total Protein 6.4  6.0 - 8.3 g/dL    Albumin 3.4 (*) 3.5 - 5.2 g/dL    AST 22  0 - 37 U/L    ALT 19  0 - 35 U/L    Alkaline Phosphatase 55   39 - 117 U/L    Total Bilirubin 1.8 (*) 0.3 - 1.2 mg/dL    GFR calc non Af Amer >90  >90 mL/min    GFR calc Af Amer >90  >90 mL/min     Studies/Results: No results found.  Patient Active Problem List  Diagnosis  . SICKLE CELL ANEMIA  . TRICHOTILLOMANIA  . Active smoker  . Back pain  . Depression  . GERD (gastroesophageal reflux disease)  . Contraception management  . Stress  . Overweight  . Vaso-occlusive sickle cell crisis  . Right thigh pain  . Vaginosis  . Tachycardia  . Headache  . Nausea & vomiting    Impression: Sickle cell crisis. Hypokalemia symptomatic. Situational stress with chronic anxiety disorder. 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 GERD history. Presently asymptomatic.   Plan: Replenish her potassium. Adjust IV fluids. Taper IV Dilaudid. Progress to discharge.   Kenora Spayd 03/12/2012,10:00 PM

## 2012-03-13 NOTE — Progress Notes (Signed)
Subjective:  Patient continues to complain of pain mainly in her shoulder and back. She rates her pain as 8/10. She notes pain in her shoulder and elevating of 90. She presently feels she cannot manage her pain at home at the present level. Denies chest pains or shortness of breath. She has been ambulating in room.   Allergies  Allergen Reactions  . Carrot Oil Hives and Swelling  . Latex Rash  . Toradol (Ketorolac Tromethamine) Hives and Itching   Current Facility-Administered Medications  Medication Dose Route Frequency Provider Last Rate Last Dose  . 0.45 % NaCl with KCl 20 mEq / L infusion   Intravenous Continuous Gwenyth Bender, MD 50 mL/hr at 03/12/12 2347    . cholecalciferol (VITAMIN D) tablet 1,000 Units  1,000 Units Oral q morning - 10a Gwenyth Bender, MD   1,000 Units at 03/13/12 1042  . docusate sodium (COLACE) capsule 100 mg  100 mg Oral BID Houston Siren, MD   100 mg at 03/13/12 2102  . enoxaparin (LOVENOX) injection 40 mg  40 mg Subcutaneous Daily Houston Siren, MD   40 mg at 03/07/12 1000  . folic acid (FOLVITE) tablet 1 mg  1 mg Oral Daily Houston Siren, MD   1 mg at 03/13/12 1042  . HYDROmorphone (DILAUDID) injection 3 mg  3 mg Intravenous Q2H PRN Gwenyth Bender, MD   3 mg at 03/13/12 2334  . hydroxyurea (HYDREA) capsule 1,000 mg  1,000 mg Oral Daily Houston Siren, MD   1,000 mg at 03/13/12 1039  . ibuprofen (ADVIL,MOTRIN) tablet 600 mg  600 mg Oral TID Gwenyth Bender, MD   600 mg at 03/12/12 2302  . ondansetron (ZOFRAN) tablet 4 mg  4 mg Oral Q6H PRN Houston Siren, MD       Or  . ondansetron [facility]) injection 4 mg  4 mg Intravenous Q6H PRN Houston Siren, MD   4 mg at 03/12/12 1118  . oxyCODONE (Oxy IR/ROXICODONE) immediate release tablet 15 mg  15 mg Oral Q4H PRN Houston Siren, MD   15 mg at 03/10/12 1345  . pantoprazole (PROTONIX) EC tablet 40 mg  40 mg Oral BID AC Gwenyth Bender, MD   40 mg at 03/13/12 1819  . sodium chloride 0.9 % injection 10-40 mL  10-40 mL Intracatheter PRN Gwenyth Bender, MD   10 mL at 03/13/12 1648     Objective: Blood pressure 108/56, pulse 98, temperature 98.9 F (37.2 C), temperature source Oral, resp. rate 18, height 5\' 2"  (1.575 m), weight 194 lb 10.7 oz (88.3 kg), last menstrual period 02/08/2012, SpO2 98.00%, not currently breastfeeding.  Well-developed well-nourished black female in mild distress. HEENT: No sinus tenderness. Minimal sclera icterus. NECK: No enlarged thyroid. No posterior cervical nodes. LUNGS: Clear to auscultation. No vocal fremitus. CV: Normal S1, S2 without S3. ABD: Nontender. MSK: Marked tenderness and left a.c. joint. Minimal tenderness left deltoid tendon. No tenderness in right a.c. joint. Tenderness in lower lumbar sacral spine. Negative Homans bilaterally. NEURO: Nonfocal.  Lab results: Results for orders placed during the hospital encounter of 03/03/12 (from the past 48 hour(s))  CBC WITH DIFFERENTIAL     Status: Abnormal   Collection Time   03/12/12  5:20 AM      Component Value Range Comment   WBC 14.9 (*) 4.0 - 10.5 K/uL    RBC 2.58 (*) 3.87 - 5.11 MIL/uL    Hemoglobin 8.5 (*) 12.0 - 15.0 g/dL    HCT 16.1 (*)  36.0 - 46.0 %    MCV 92.6  78.0 - 100.0 fL    MCH 32.9  26.0 - 34.0 pg    MCHC 35.6  30.0 - 36.0 g/dL    RDW 40.9 (*) 81.1 - 15.5 %    Platelets 384  150 - 400 K/uL    Neutrophils Relative 66  43 - 77 %    Lymphocytes Relative 19  12 - 46 %    Monocytes Relative 8  3 - 12 %    Eosinophils Relative 7 (*) 0 - 5 %    Basophils Relative 0  0 - 1 %    Neutro Abs 9.9 (*) 1.7 - 7.7 K/uL    Lymphs Abs 2.8  0.7 - 4.0 K/uL    Monocytes Absolute 1.2 (*) 0.1 - 1.0 K/uL    Eosinophils Absolute 1.0 (*) 0.0 - 0.7 K/uL    Basophils Absolute 0.0  0.0 - 0.1 K/uL    RBC Morphology TARGET CELLS      Smear Review LARGE PLATELETS PRESENT     COMPREHENSIVE METABOLIC PANEL     Status: Abnormal   Collection Time   03/12/12  5:20 AM      Component Value Range Comment   Sodium 135  135 - 145 mEq/L    Potassium 3.3 (*) 3.5 - 5.1 mEq/L    Chloride  102  96 - 112 mEq/L    CO2 25  19 - 32 mEq/L    Glucose, Bld 111 (*) 70 - 99 mg/dL    BUN 6  6 - 23 mg/dL    Creatinine, Ser 9.14  0.50 - 1.10 mg/dL    Calcium 8.6  8.4 - 78.2 mg/dL    Total Protein 6.4  6.0 - 8.3 g/dL    Albumin 3.4 (*) 3.5 - 5.2 g/dL    AST 22  0 - 37 U/L    ALT 19  0 - 35 U/L    Alkaline Phosphatase 55  39 - 117 U/L    Total Bilirubin 1.8 (*) 0.3 - 1.2 mg/dL    GFR calc non Af Amer >90  >90 mL/min    GFR calc Af Amer >90  >90 mL/min   CBC WITH DIFFERENTIAL     Status: Abnormal   Collection Time   03/13/12  6:03 AM      Component Value Range Comment   WBC 15.9 (*) 4.0 - 10.5 K/uL    RBC 2.68 (*) 3.87 - 5.11 MIL/uL    Hemoglobin 8.4 (*) 12.0 - 15.0 g/dL    HCT 95.6 (*) 21.3 - 46.0 %    MCV 91.4  78.0 - 100.0 fL    MCH 31.3  26.0 - 34.0 pg    MCHC 34.3  30.0 - 36.0 g/dL    RDW 08.6 (*) 57.8 - 15.5 %    Platelets 384  150 - 400 K/uL    Neutrophils Relative 66  43 - 77 %    Neutro Abs 10.6 (*) 1.7 - 7.7 K/uL    Lymphocytes Relative 20  12 - 46 %    Lymphs Abs 3.1  0.7 - 4.0 K/uL    Monocytes Relative 9  3 - 12 %    Monocytes Absolute 1.4 (*) 0.1 - 1.0 K/uL    Eosinophils Relative 5  0 - 5 %    Eosinophils Absolute 0.8 (*) 0.0 - 0.7 K/uL    Basophils Relative 0  0 - 1 %  Basophils Absolute 0.1  0.0 - 0.1 K/uL   COMPREHENSIVE METABOLIC PANEL     Status: Abnormal   Collection Time   03/13/12  6:03 AM      Component Value Range Comment   Sodium 139  135 - 145 mEq/L    Potassium 4.0  3.5 - 5.1 mEq/L    Chloride 105  96 - 112 mEq/L    CO2 24  19 - 32 mEq/L    Glucose, Bld 113 (*) 70 - 99 mg/dL    BUN 8  6 - 23 mg/dL    Creatinine, Ser 4.54  0.50 - 1.10 mg/dL    Calcium 8.8  8.4 - 09.8 mg/dL    Total Protein 6.7  6.0 - 8.3 g/dL    Albumin 3.4 (*) 3.5 - 5.2 g/dL    AST 19  0 - 37 U/L    ALT 16  0 - 35 U/L    Alkaline Phosphatase 57  39 - 117 U/L    Total Bilirubin 2.0 (*) 0.3 - 1.2 mg/dL    GFR calc non Af Amer >90  >90 mL/min    GFR calc Af Amer >90   >90 mL/min     Studies/Results: No results found.  Patient Active Problem List  Diagnosis  . SICKLE CELL ANEMIA  . TRICHOTILLOMANIA  . Active smoker  . Back pain  . Depression  . GERD (gastroesophageal reflux disease)  . Contraception management  . Stress  . Overweight  . Vaso-occlusive sickle cell crisis  . Right thigh pain  . Vaginosis  . Tachycardia  . Headache  . Nausea & vomiting    Impression: Resolving sickle cell crisis. Left shoulder pain rule out sickle osteopathy. Rule out early avascular necrosis. Anxiety disorder with situational stress. She's been counseled. History of tobacco use. Presently abstaining.   Plan: Followup x-ray of left shoulder with orthopedic input as necessary. Proceed with exchange transfusion in a.m. Followup CBC in CMET. Home soon.   August Saucer, Jahmez Bily 03/13/2012 11:52 PM

## 2012-03-14 ENCOUNTER — Inpatient Hospital Stay (HOSPITAL_COMMUNITY): Payer: Medicaid Other

## 2012-03-14 LAB — COMPREHENSIVE METABOLIC PANEL
ALT: 14 U/L (ref 0–35)
Alkaline Phosphatase: 55 U/L (ref 39–117)
BUN: 7 mg/dL (ref 6–23)
CO2: 25 mEq/L (ref 19–32)
Chloride: 102 mEq/L (ref 96–112)
GFR calc Af Amer: >90 mL/min (ref >90)
GFR calc non Af Amer: >90 mL/min (ref >90)
Glucose, Bld: 96 mg/dL (ref 70–99)
Potassium: 4.1 mEq/L (ref 3.5–5.1)
Total Bilirubin: 1.5 mg/dL — ABNORMAL HIGH (ref 0.3–1.2)

## 2012-03-14 LAB — VITAMIN D 1,25 DIHYDROXY
Vitamin D2 1, 25 (OH)2: <8 pg/mL
Vitamin D3 1, 25 (OH)2: 45 pg/mL

## 2012-03-14 LAB — CBC WITH DIFFERENTIAL/PLATELET
Eosinophils Absolute: 0.7 10*3/uL (ref 0.0–0.7)
Hemoglobin: 8.5 g/dL — ABNORMAL LOW (ref 12.0–15.0)
Lymphocytes Relative: 22 % (ref 12–46)
Lymphs Abs: 3 10*3/uL (ref 0.7–4.0)
MCH: 31.4 pg (ref 26.0–34.0)
Monocytes Relative: 10 % (ref 3–12)
Neutro Abs: 8.6 10*3/uL — ABNORMAL HIGH (ref 1.7–7.7)
Neutrophils Relative %: 62 % (ref 43–77)
RBC: 2.71 MIL/uL — ABNORMAL LOW (ref 3.87–5.11)
WBC: 13.8 10*3/uL — ABNORMAL HIGH (ref 4.0–10.5)

## 2012-03-14 LAB — HEMOGLOBINOPATHY EVALUATION: Hgb S Quant: 73.5 % — ABNORMAL HIGH

## 2012-03-14 NOTE — Progress Notes (Signed)
Subjective:  Patient complaining of more bilateral flank discomfort today. No increased nausea vomiting. States she might be pregnant as her menstrual is presently late. She has been checked in the ER on previous admission which have been negative. Pain is still at 8/10 today. No other new complaints.   Allergies  Allergen Reactions  . Carrot Oil Hives and Swelling  . Latex Rash  . Toradol (Ketorolac Tromethamine) Hives and Itching   Current Facility-Administered Medications  Medication Dose Route Frequency Provider Last Rate Last Dose  . 0.45 % NaCl with KCl 20 mEq / L infusion   Intravenous Continuous Gwenyth Bender, MD 50 mL/hr at 03/12/12 2347    . cholecalciferol (VITAMIN D) tablet 1,000 Units  1,000 Units Oral q morning - 10a Gwenyth Bender, MD   1,000 Units at 03/14/12 [phone]  . docusate sodium (COLACE) capsule 100 mg  100 mg Oral BID Houston Siren, MD   100 mg at 03/14/12 0956  . enoxaparin (LOVENOX) injection 40 mg  40 mg Subcutaneous Daily Houston Siren, MD   40 mg at 03/07/12 1000  . folic acid (FOLVITE) tablet 1 mg  1 mg Oral Daily Houston Siren, MD   1 mg at 03/14/12 0958  . HYDROmorphone (DILAUDID) injection 3 mg  3 mg Intravenous Q2H PRN Gwenyth Bender, MD   3 mg at 03/14/12 1847  . hydroxyurea (HYDREA) capsule 1,000 mg  1,000 mg Oral Daily Houston Siren, MD   1,000 mg at 03/14/12 0957  . ibuprofen (ADVIL,MOTRIN) tablet 600 mg  600 mg Oral TID Gwenyth Bender, MD   600 mg at 03/12/12 2302  . ondansetron (ZOFRAN) tablet 4 mg  4 mg Oral Q6H PRN Houston Siren, MD       Or  . ondansetron [facility]) injection 4 mg  4 mg Intravenous Q6H PRN Houston Siren, MD   4 mg at 03/12/12 1118  . oxyCODONE (Oxy IR/ROXICODONE) immediate release tablet 15 mg  15 mg Oral Q4H PRN Houston Siren, MD   15 mg at 03/14/12 1411  . pantoprazole (PROTONIX) EC tablet 40 mg  40 mg Oral BID AC Gwenyth Bender, MD   40 mg at 03/14/12 1847  . sodium chloride 0.9 % injection 10-40 mL  10-40 mL Intracatheter PRN Gwenyth Bender, MD   10 mL at 03/13/12 1648     Objective: Blood pressure 119/68, pulse 99, temperature 98 F (36.7 C), temperature source Oral, resp. rate 18, height 5\' 2"  (1.575 m), weight 194 lb 10.7 oz (88.3 kg), last menstrual period 02/08/2012, SpO2 99.00%, not currently breastfeeding.  Well-developed well-nourished black female in no acute distress. HEENT:no sinus tenderness. No sclera icterus. NECK:no enlarged thyroid. No posterior cervical nodes. LUNGS:clear to auscultation. RU:EAVWUJ S1, S2 without S3. WJX:BJYNWG upper quadrant tenderness bilaterally. No masses appreciated. NFA:OZHYQMVH Homans. Decreased tenderness in lower lumbar sacral spine and right leg. NEURO:nonfocal.  Lab results: Results for orders placed during the hospital encounter of 03/03/12 (from the past 48 hour(s))  CBC WITH DIFFERENTIAL     Status: Abnormal   Collection Time   03/13/12  6:03 AM      Component Value Range Comment   WBC 15.9 (*) 4.0 - 10.5 K/uL    RBC 2.68 (*) 3.87 - 5.11 MIL/uL    Hemoglobin 8.4 (*) 12.0 - 15.0 g/dL    HCT 84.6 (*) 96.2 - 46.0 %    MCV 91.4  78.0 - 100.0 fL    MCH 31.3  26.0 - 34.0  pg    MCHC 34.3  30.0 - 36.0 g/dL    RDW 16.1 (*) 09.6 - 15.5 %    Platelets 384  150 - 400 K/uL    Neutrophils Relative 66  43 - 77 %    Neutro Abs 10.6 (*) 1.7 - 7.7 K/uL    Lymphocytes Relative 20  12 - 46 %    Lymphs Abs 3.1  0.7 - 4.0 K/uL    Monocytes Relative 9  3 - 12 %    Monocytes Absolute 1.4 (*) 0.1 - 1.0 K/uL    Eosinophils Relative 5  0 - 5 %    Eosinophils Absolute 0.8 (*) 0.0 - 0.7 K/uL    Basophils Relative 0  0 - 1 %    Basophils Absolute 0.1  0.0 - 0.1 K/uL   COMPREHENSIVE METABOLIC PANEL     Status: Abnormal   Collection Time   03/13/12  6:03 AM      Component Value Range Comment   Sodium 139  135 - 145 mEq/L    Potassium 4.0  3.5 - 5.1 mEq/L    Chloride 105  96 - 112 mEq/L    CO2 24  19 - 32 mEq/L    Glucose, Bld 113 (*) 70 - 99 mg/dL    BUN 8  6 - 23 mg/dL    Creatinine, Ser 0.45  0.50 - 1.10 mg/dL     Calcium 8.8  8.4 - 10.5 mg/dL    Total Protein 6.7  6.0 - 8.3 g/dL    Albumin 3.4 (*) 3.5 - 5.2 g/dL    AST 19  0 - 37 U/L    ALT 16  0 - 35 U/L    Alkaline Phosphatase 57  39 - 117 U/L    Total Bilirubin 2.0 (*) 0.3 - 1.2 mg/dL    GFR calc non Af Amer >90  >90 mL/min    GFR calc Af Amer >90  >90 mL/min   CBC WITH DIFFERENTIAL     Status: Abnormal   Collection Time   03/14/12  5:00 AM      Component Value Range Comment   WBC 13.8 (*) 4.0 - 10.5 K/uL    RBC 2.71 (*) 3.87 - 5.11 MIL/uL    Hemoglobin 8.5 (*) 12.0 - 15.0 g/dL    HCT 40.9 (*) 81.1 - 46.0 %    MCV 91.1  78.0 - 100.0 fL    MCH 31.4  26.0 - 34.0 pg    MCHC 34.4  30.0 - 36.0 g/dL    RDW 91.4 (*) 78.2 - 15.5 %    Platelets 394  150 - 400 K/uL    Neutrophils Relative 62  43 - 77 %    Neutro Abs 8.6 (*) 1.7 - 7.7 K/uL    Lymphocytes Relative 22  12 - 46 %    Lymphs Abs 3.0  0.7 - 4.0 K/uL    Monocytes Relative 10  3 - 12 %    Monocytes Absolute 1.4 (*) 0.1 - 1.0 K/uL    Eosinophils Relative 5  0 - 5 %    Eosinophils Absolute 0.7  0.0 - 0.7 K/uL    Basophils Relative 1  0 - 1 %    Basophils Absolute 0.1  0.0 - 0.1 K/uL   COMPREHENSIVE METABOLIC PANEL     Status: Abnormal   Collection Time   03/14/12  5:00 AM      Component Value Range Comment  Sodium 134 (*) 135 - 145 mEq/L    Potassium 4.1  3.5 - 5.1 mEq/L    Chloride 102  96 - 112 mEq/L    CO2 25  19 - 32 mEq/L    Glucose, Bld 96  70 - 99 mg/dL    BUN 7  6 - 23 mg/dL    Creatinine, Ser 8.11  0.50 - 1.10 mg/dL    Calcium 9.0  8.4 - 91.4 mg/dL    Total Protein 6.7  6.0 - 8.3 g/dL    Albumin 3.5  3.5 - 5.2 g/dL    AST 20  0 - 37 U/L    ALT 14  0 - 35 U/L    Alkaline Phosphatase 55  39 - 117 U/L    Total Bilirubin 1.5 (*) 0.3 - 1.2 mg/dL    GFR calc non Af Amer >90  >90 mL/min    GFR calc Af Amer >90  >90 mL/min     Studies/Results: US Abdomen Complete  03/14/2012  *RADIOLOGY REPORT*  Clinical Data:  Sickle cell crisis.  Bilateral flank pain.  COMPLETE  ABDOMINAL ULTRASOUND  Comparison:  CT abdomen 06/21/2011  Findings:  Gallbladder:  Surgically absent  Common bile duct:  Normal at 5 mm.  Liver:  No focal lesion identified.  Within normal limits in parenchymal echogenicity.  IVC:  Appears normal.  Pancreas:  No focal abnormality seen.  Spleen:  Small and  lobular  Right Kidney:  11.1cm in length.  No evidence of hydronephrosis or stones.  Left Kidney:  11.5cm in length.  No evidence of hydronephrosis or stones.  Abdominal aorta:  No aneurysm identified.  IMPRESSION:  1.  No acute abdominal findings.  2.  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 3.  No hydronephrosis.   Original Report Authenticated By: Genevive Bi, M.D.     Patient Active Problem List  Diagnosis  . SICKLE CELL ANEMIA  . TRICHOTILLOMANIA  . Active smoker  . Back pain  . Depression  . GERD (gastroesophageal reflux disease)  . Contraception management  . Stress  . Overweight  . Vaso-occlusive sickle cell crisis  . Right thigh pain  . Vaginosis  . Tachycardia  . Headache  . Nausea & vomiting    Impression: Sickle cell crisis. Gradually resolving. Active hemolysis decreasing. Leukocytosis resolving. Bilateral flank pain question etiology.   Plan: Abdominal ultrasound today. Continue IV fluids. Taper IV Dilaudid. Transition to home if above studies negative. Followup CBC, CMET for documentation of resolution of crisis.   August Saucer, Veola Cafaro 03/14/2012 6:51 PM

## 2012-03-15 ENCOUNTER — Inpatient Hospital Stay (HOSPITAL_COMMUNITY): Payer: Medicaid Other

## 2012-03-15 LAB — COMPREHENSIVE METABOLIC PANEL
ALT: 10 U/L (ref 0–35)
AST: 17 U/L (ref 0–37)
Albumin: 3.5 g/dL (ref 3.5–5.2)
CO2: 25 mEq/L (ref 19–32)
Calcium: 9 mg/dL (ref 8.4–10.5)
Chloride: 103 mEq/L (ref 96–112)
Creatinine, Ser: 0.62 mg/dL (ref 0.50–1.10)
GFR calc non Af Amer: >90 mL/min (ref >90)
Sodium: 137 mEq/L (ref 135–145)
Total Bilirubin: 1.4 mg/dL — ABNORMAL HIGH (ref 0.3–1.2)

## 2012-03-15 LAB — CBC WITH DIFFERENTIAL/PLATELET
Basophils Absolute: 0.1 10*3/uL (ref 0.0–0.1)
Basophils Relative: 1 % (ref 0–1)
Lymphocytes Relative: 25 % (ref 12–46)
MCHC: 34.7 g/dL (ref 30.0–36.0)
Neutro Abs: 8.6 10*3/uL — ABNORMAL HIGH (ref 1.7–7.7)
Platelets: 355 10*3/uL (ref 150–400)
RDW: 17.9 % — ABNORMAL HIGH (ref 11.5–15.5)
WBC: 14.1 10*3/uL — ABNORMAL HIGH (ref 4.0–10.5)

## 2012-03-15 MED ORDER — PROMETHAZINE HCL 25 MG PO TABS: 25.0000 mg | ORAL_TABLET | Freq: Four times a day (QID) | ORAL | Status: DC | PRN

## 2012-03-15 MED ORDER — OXYCODONE HCL 15 MG PO TABS: 15.0000 mg | ORAL_TABLET | ORAL | Status: DC | PRN

## 2012-03-15 MED ORDER — OXYCODONE HCL 10 MG PO TB12: 10.0000 mg | ORAL_TABLET | Freq: Two times a day (BID) | ORAL | Status: DC

## 2012-03-15 MED ORDER — FOLIC ACID 1 MG PO TABS: 1.0000 mg | ORAL_TABLET | Freq: Every day | ORAL | Status: DC

## 2012-03-15 NOTE — Progress Notes (Signed)
Patient dressing over PICC site came off before discharge. RN replaced dressing (4X4 with hypofix tape). RN called IV team to OK that this dressing was replaced. IV team OK patient to be discharged with this dressing. Patient taken to car in wheelchair by NT. Patient discharged home with prescriptions and D/C instructions.

## 2012-03-15 NOTE — Progress Notes (Signed)
Educated pt on Ibuprofen, pt refused it and wanted to continue dilaudid. Pt stated, "if I take the Ibuprofen will harm my baby." Pt continues takes dilaudid.

## 2012-03-15 NOTE — Discharge Summary (Signed)
Physician Discharge Summary  Patient ID: [name] MRN: [id number] DOB/AGE: [birth year] 21 y.o.  Admit date: 03/03/2012 Discharge date: 03/15/2012   Discharge Diagnoses:   Sickle cell crisis, resolving. Avascular necrosis of left shoulder. Mood disorder. Situational stress. Trichollomania History of tobacco abuse. Gastroesophageal reflux disease.  Discharged Condition: improved.  Operations/Procedues: PICC line placement   Hospital Course: Patient was treated with hypotonic fluids consisting of half-normal saline with potassium supplementation. She was given IV Dilaudid at 2-4 mg every 2 hours when necessary pain. She was maintained on home medication as well. Patient continue show active signs of hemolysis for several days. She eventually required a PICC line due to poor vein access. Her medications were adjusted to help with control of her muscle skeletal pain. Patient acknowledged multiple stressors which were briefly addressed during the hospital stay. She was encouraged for further outpatient followup of this as well. Due to her persistent crisis, patient did undergo an exchange transfusion on 10/30. 300 cc of blood was withdrawn with one unit of packed RBCs  being transfused. Patient tolerated procedure well. She continues to signs of a resolution of her hemolysis. Her pain medications were switched to by mouth medication with IV backup. She is subsequently discharged home thereafter.   Significant Diagnostic Studies: None  Disposition: 01-Home or Self Care  Discharge Orders    Future Orders Please Complete By Expires   Diet - low sodium heart healthy      Increase activity slowly      No wound care      Call MD for:  severe uncontrolled pain      Discharge instructions      Comments:   Follow up with primary care physician in two weeks.       Medication List     As of 03/15/2012  5:49 PM    TAKE these medications         folic acid 1 MG tablet   Commonly  known as: FOLVITE   Take 1 tablet (1 mg total) by mouth daily.      hydroxyurea 500 MG capsule   Commonly known as: HYDREA   Take 1,000 mg by mouth daily. May take with food to minimize GI side effects.      oxyCODONE 15 MG immediate release tablet   Commonly known as: ROXICODONE   Take 1 tablet (15 mg total) by mouth every 4 (four) hours as needed. For pain      oxyCODONE 10 MG 12 hr tablet   Commonly known as: OXYCONTIN   Take 1 tablet (10 mg total) by mouth every 12 (twelve) hours.      pantoprazole 40 MG tablet   Commonly known as: PROTONIX   Take 1 tablet (40 mg total) by mouth daily at 12 noon.      promethazine 25 MG tablet   Commonly known as: PHENERGAN   Take 1 tablet (25 mg total) by mouth every 6 (six) hours as needed for nausea.         SignedAugust Saucer, Coleman Kalas 03/15/2012, 5:49 PM  Time spent with discharge management greater than 35 minutes.

## 2012-03-18 IMAGING — CR DG CHEST 2V
2 series · 2 of 2 positions shown · non-contrast
Comparison: 04/19/2004

CLINICAL DATA: Cough, back pain, chest pain, sickle cell disease

CHEST - 2 VIEW

[w chest pa]
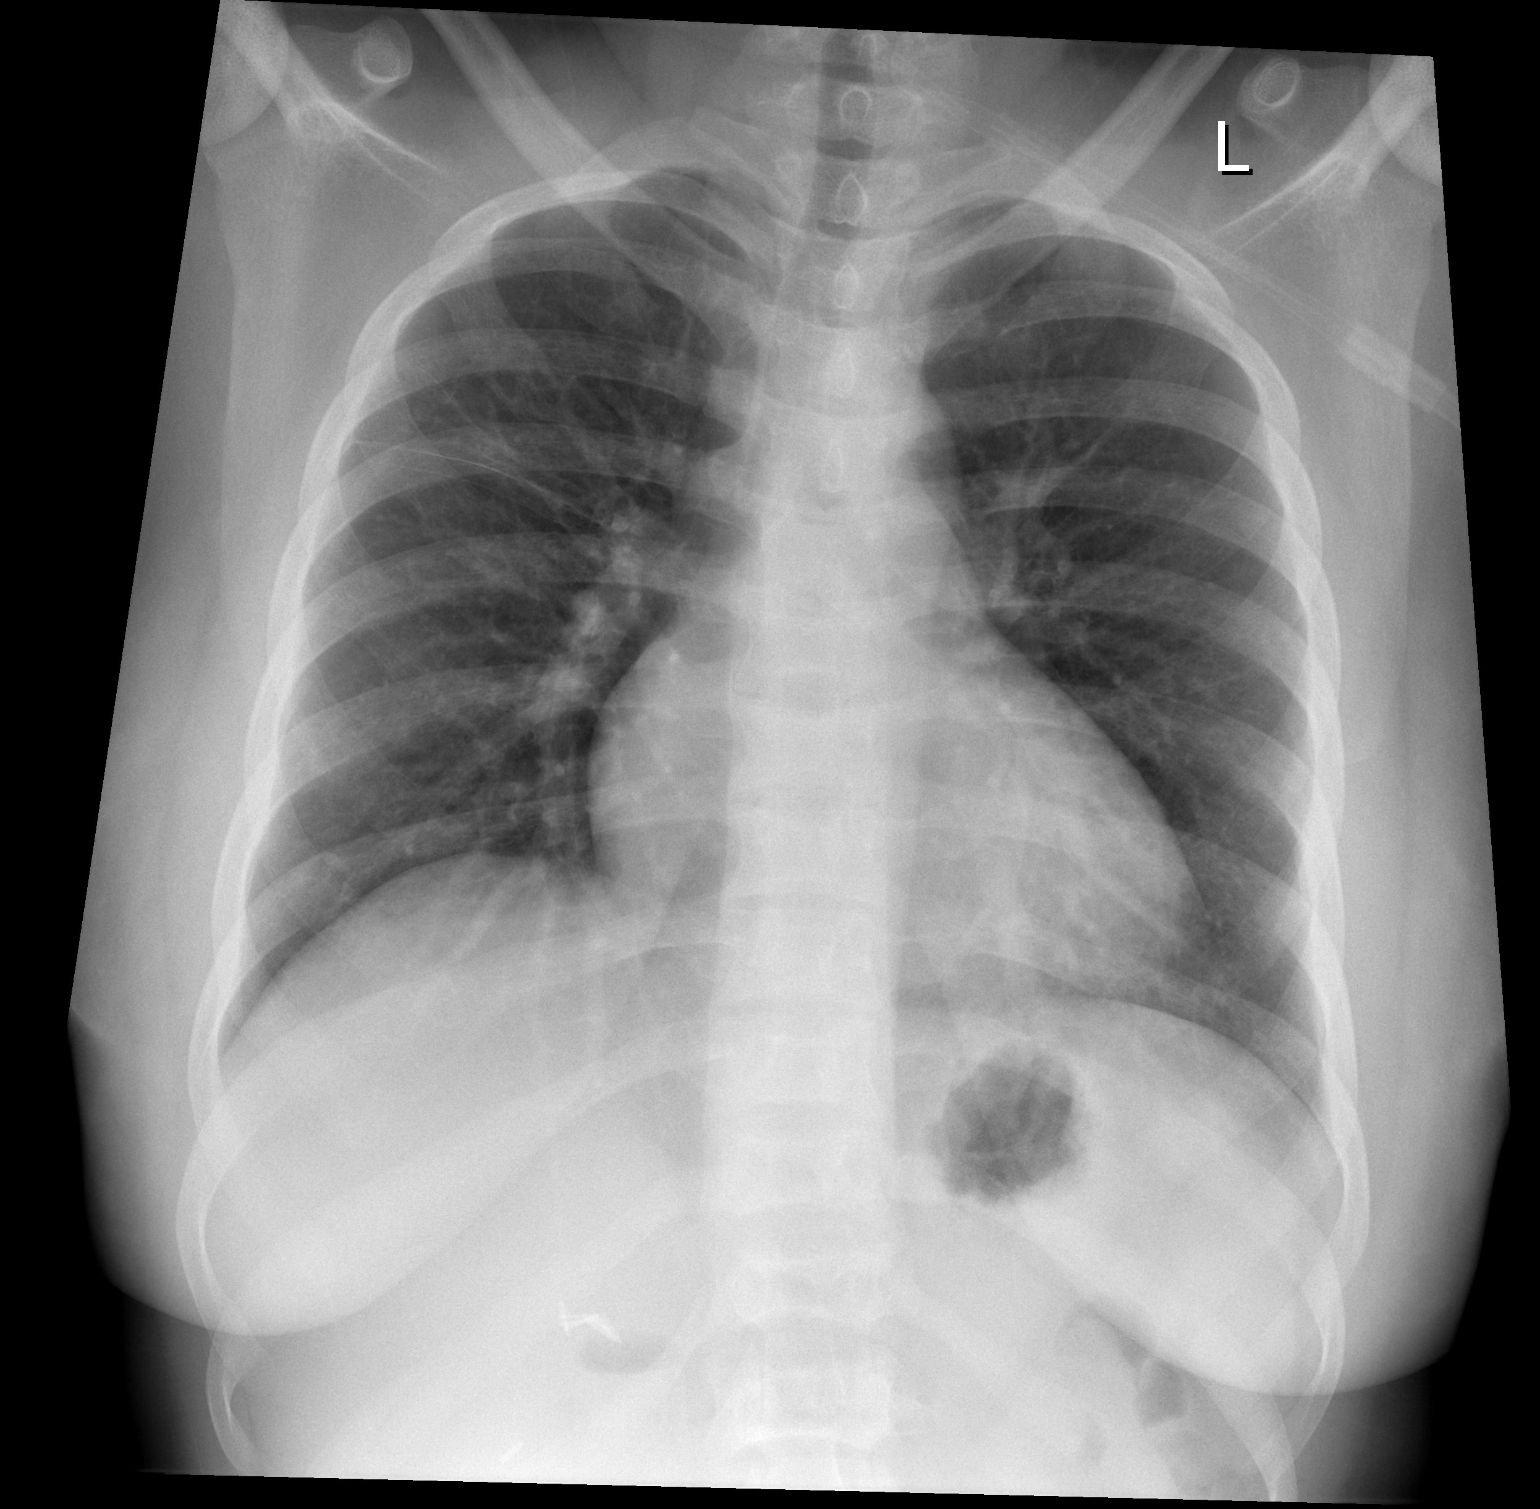

[w chest lat]
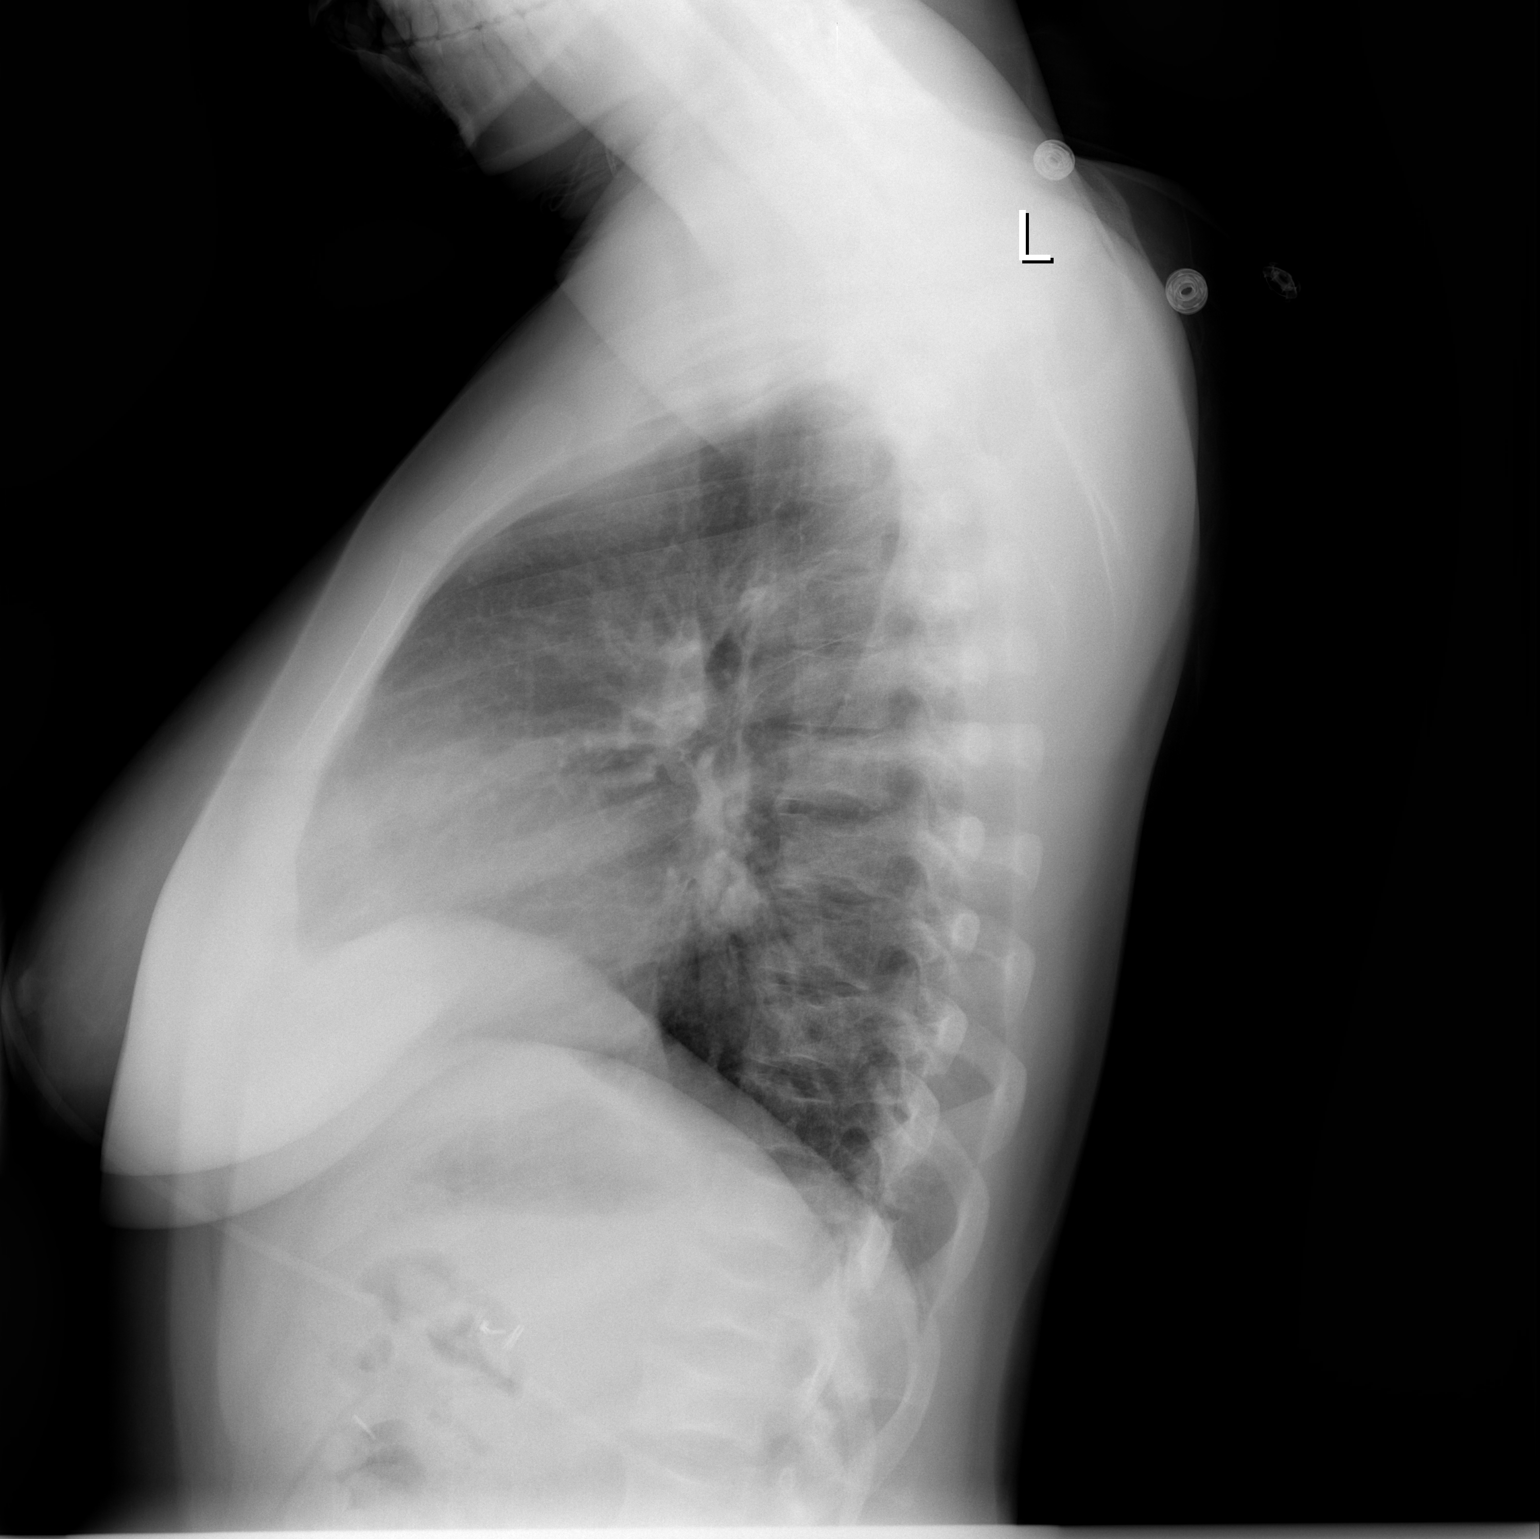

[2 of 2 positions shown; findings below may reference images not displayed]

FINDINGS: Enlargement of cardiac silhouette.
Mediastinal contours and pulmonary vascularity normal.
Slight volume loss in the right upper lobe with superior retraction
of the minor fissure, stable.
No acute infiltrate, pleural effusion or pneumothorax.
Chronic irregularity of vertebral endplates thoracic spine
compatible with sickle cell disease.
IMPRESSION: Enlargement of cardiac silhouette compatible with sickle cell
disease.
No acute abnormalities.

## 2012-03-20 IMAGING — CR DG SHOULDER 2+V*R*
3 series · 3 of 3 positions shown · non-contrast
Comparison: None.

CLINICAL DATA: Right shoulder pain

RIGHT SHOULDER - 2+ VIEW

[w shoulder external right]
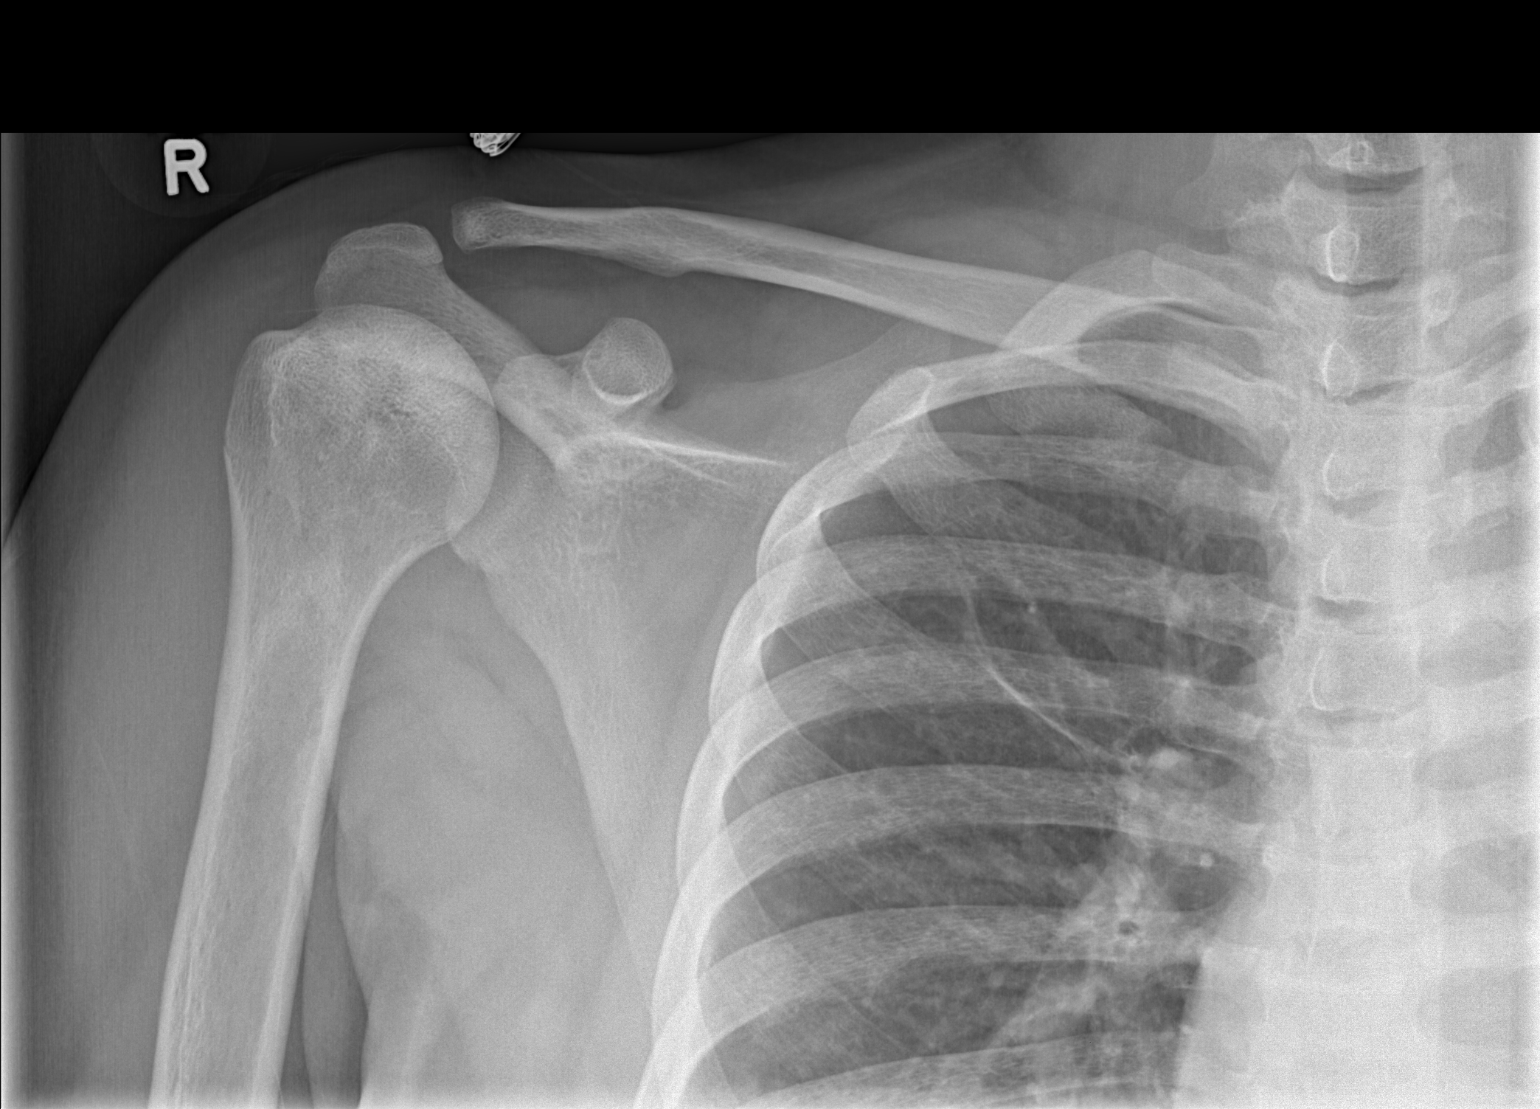

[w shoulder internal right]
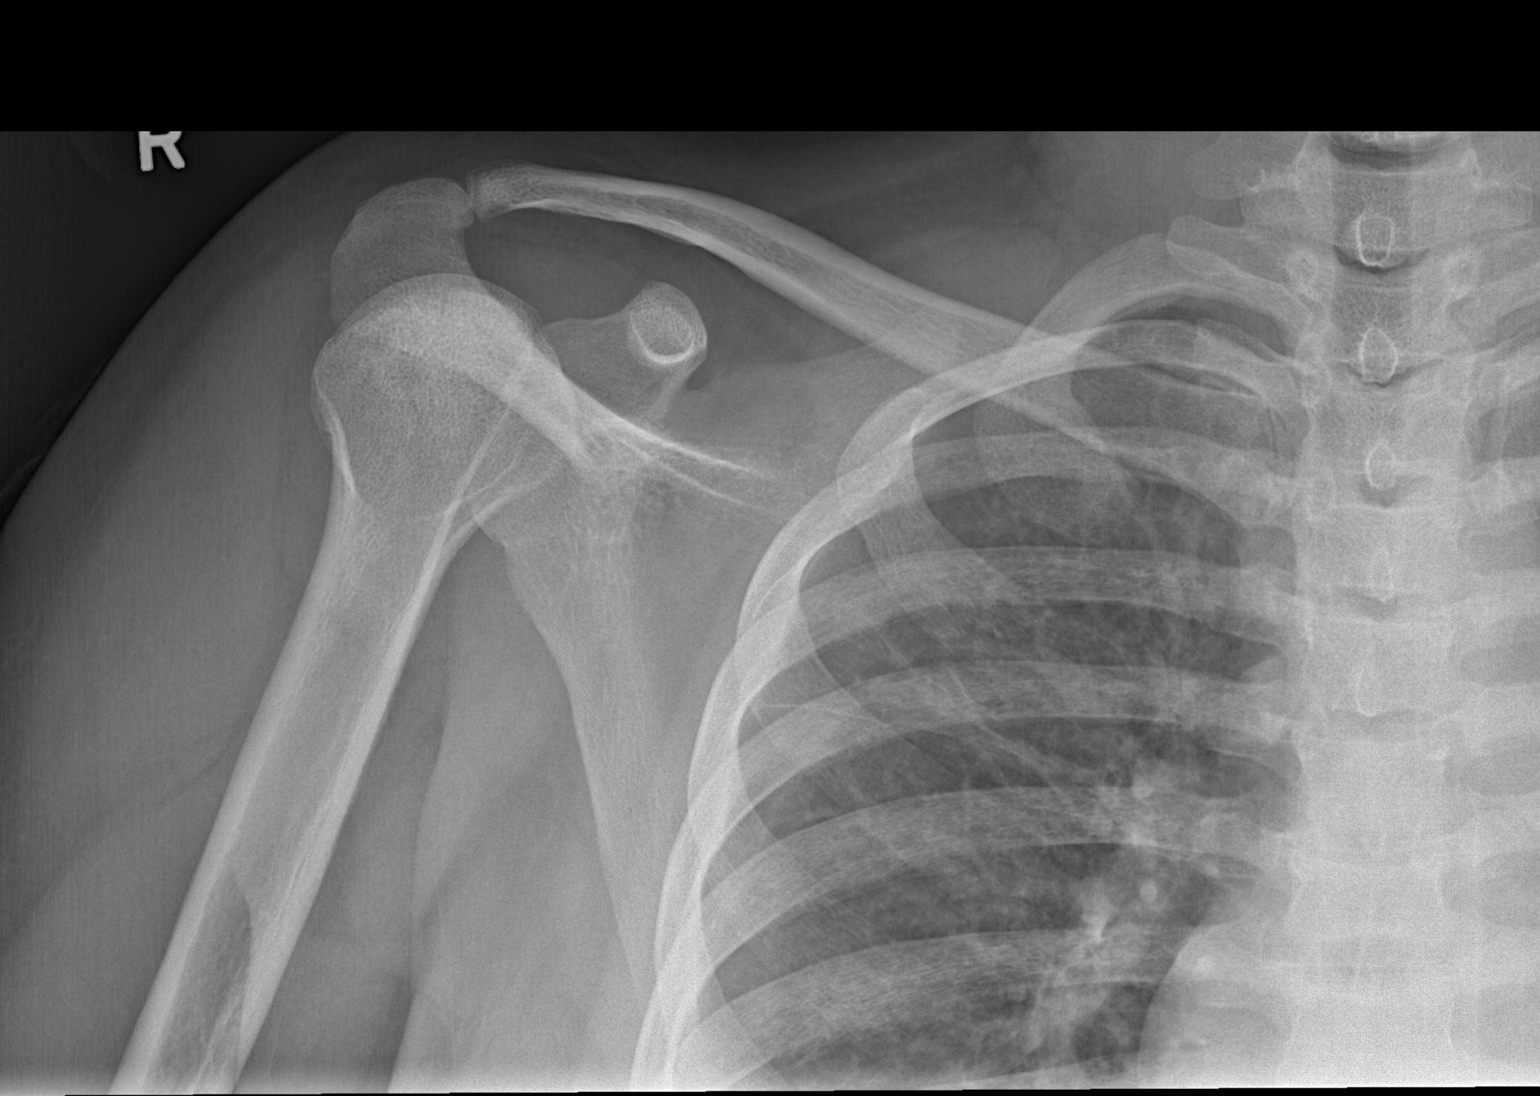

[w shoulder y-view right]
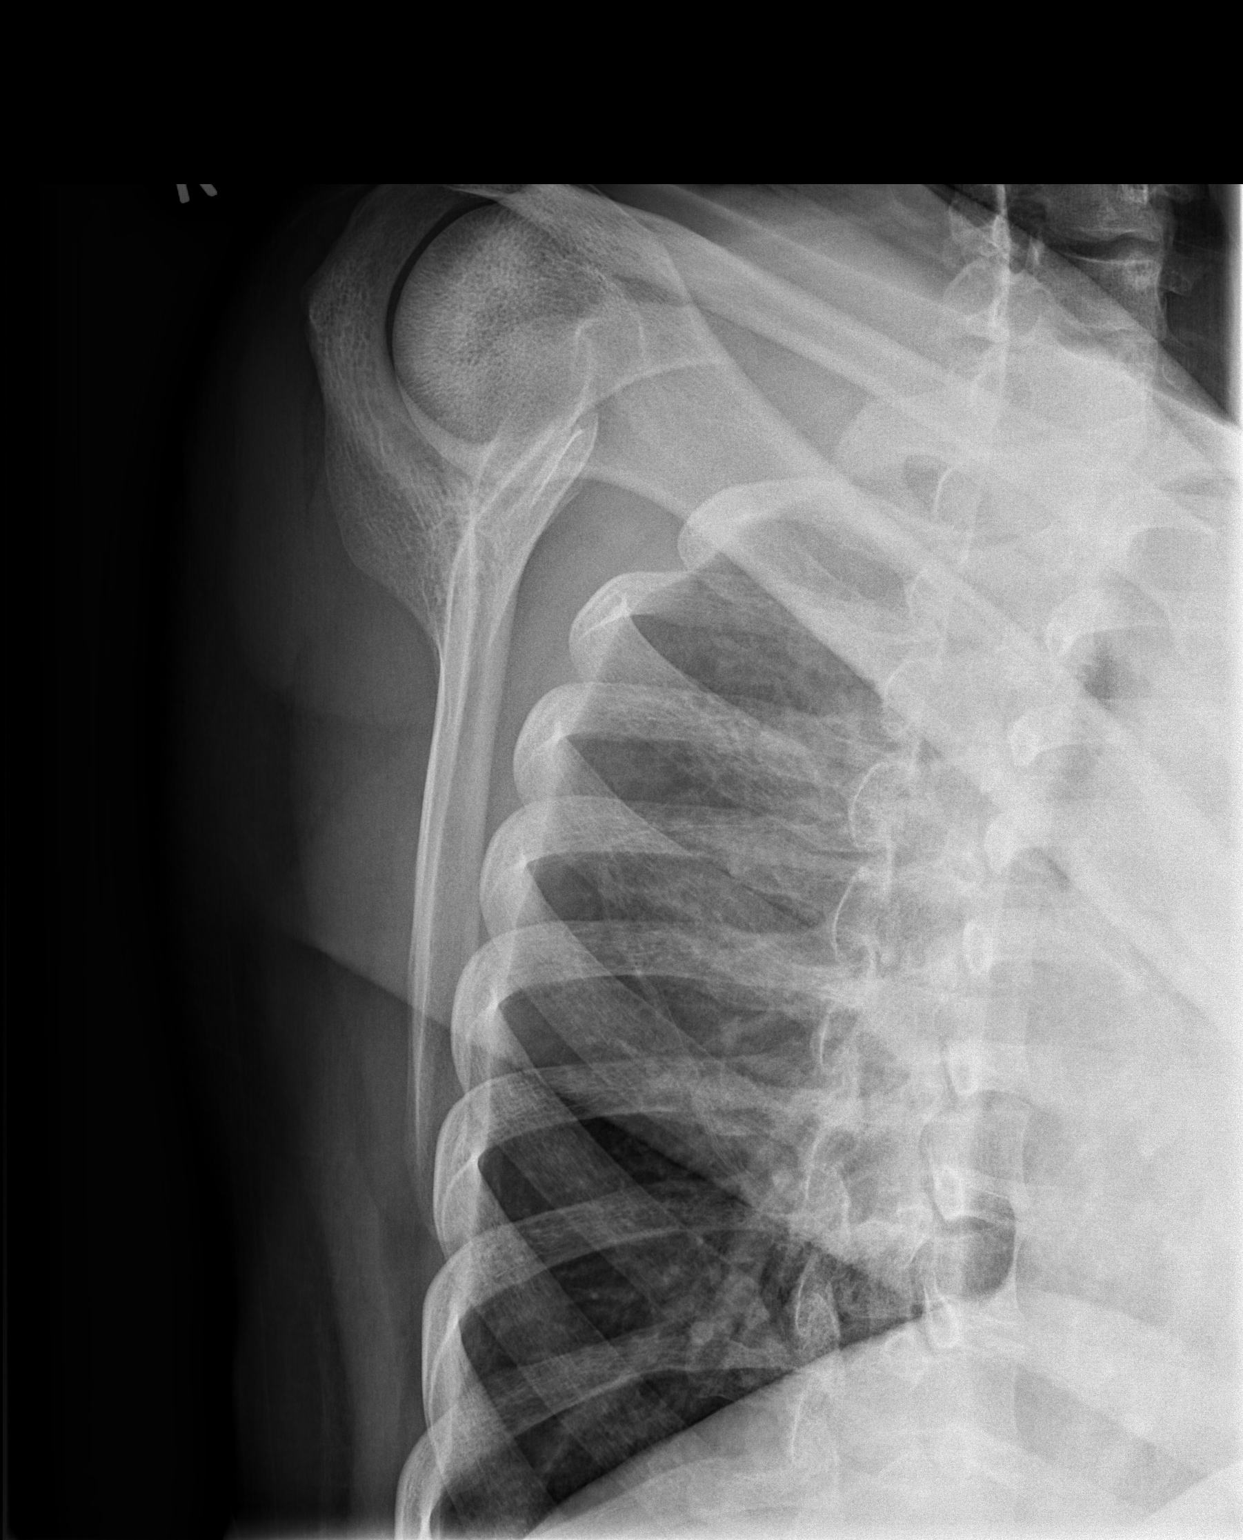

[3 of 3 positions shown; findings below may reference images not displayed]

FINDINGS: No evidence for fracture.  No shoulder separation or
dislocation.  No changes in the proximal humerus to suggest bone
infarct.
IMPRESSION: Normal exam.

## 2012-03-20 IMAGING — CR DG CHEST 2V
2 series · 2 of 2 positions shown · non-contrast
Comparison: 05/04/2011

CLINICAL DATA: Sickle cell disease, right shoulder and back pain.

CHEST - 2 VIEW

[w chest pa]
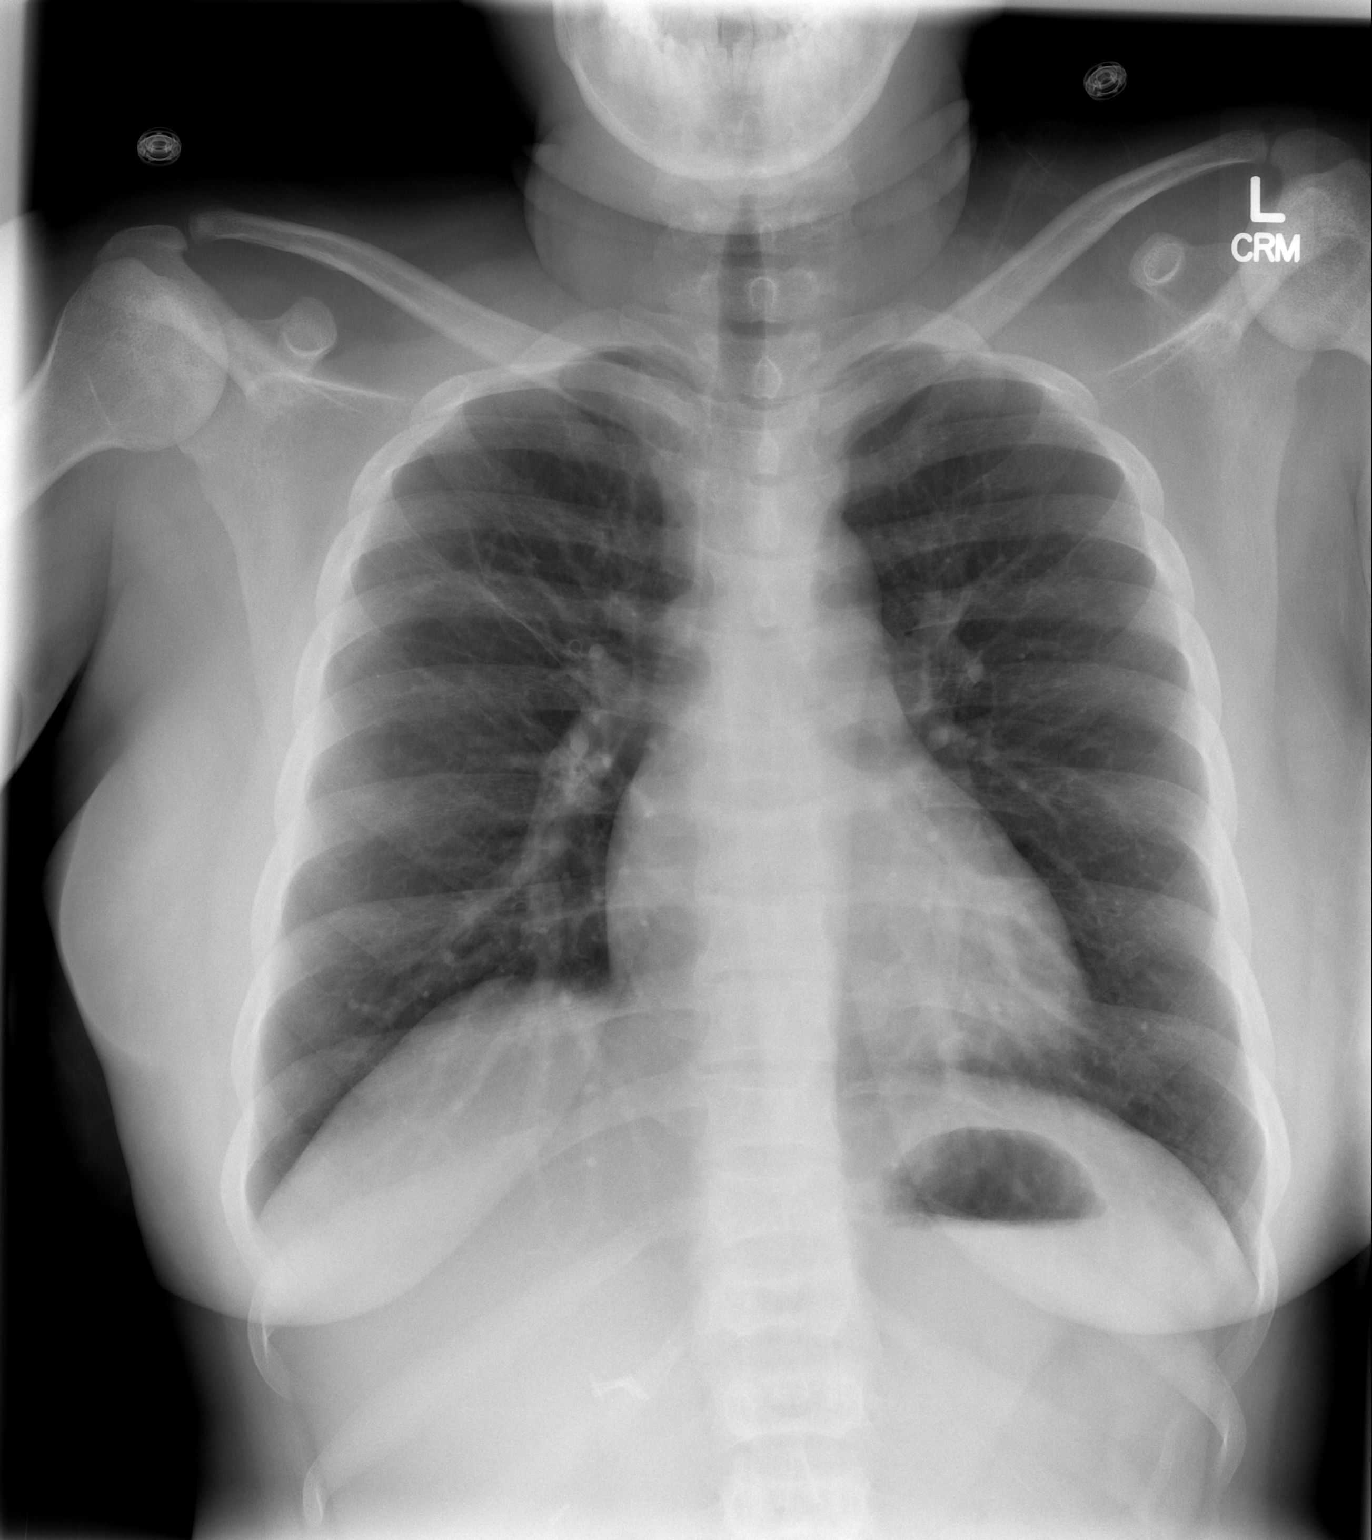

[w chest lat]
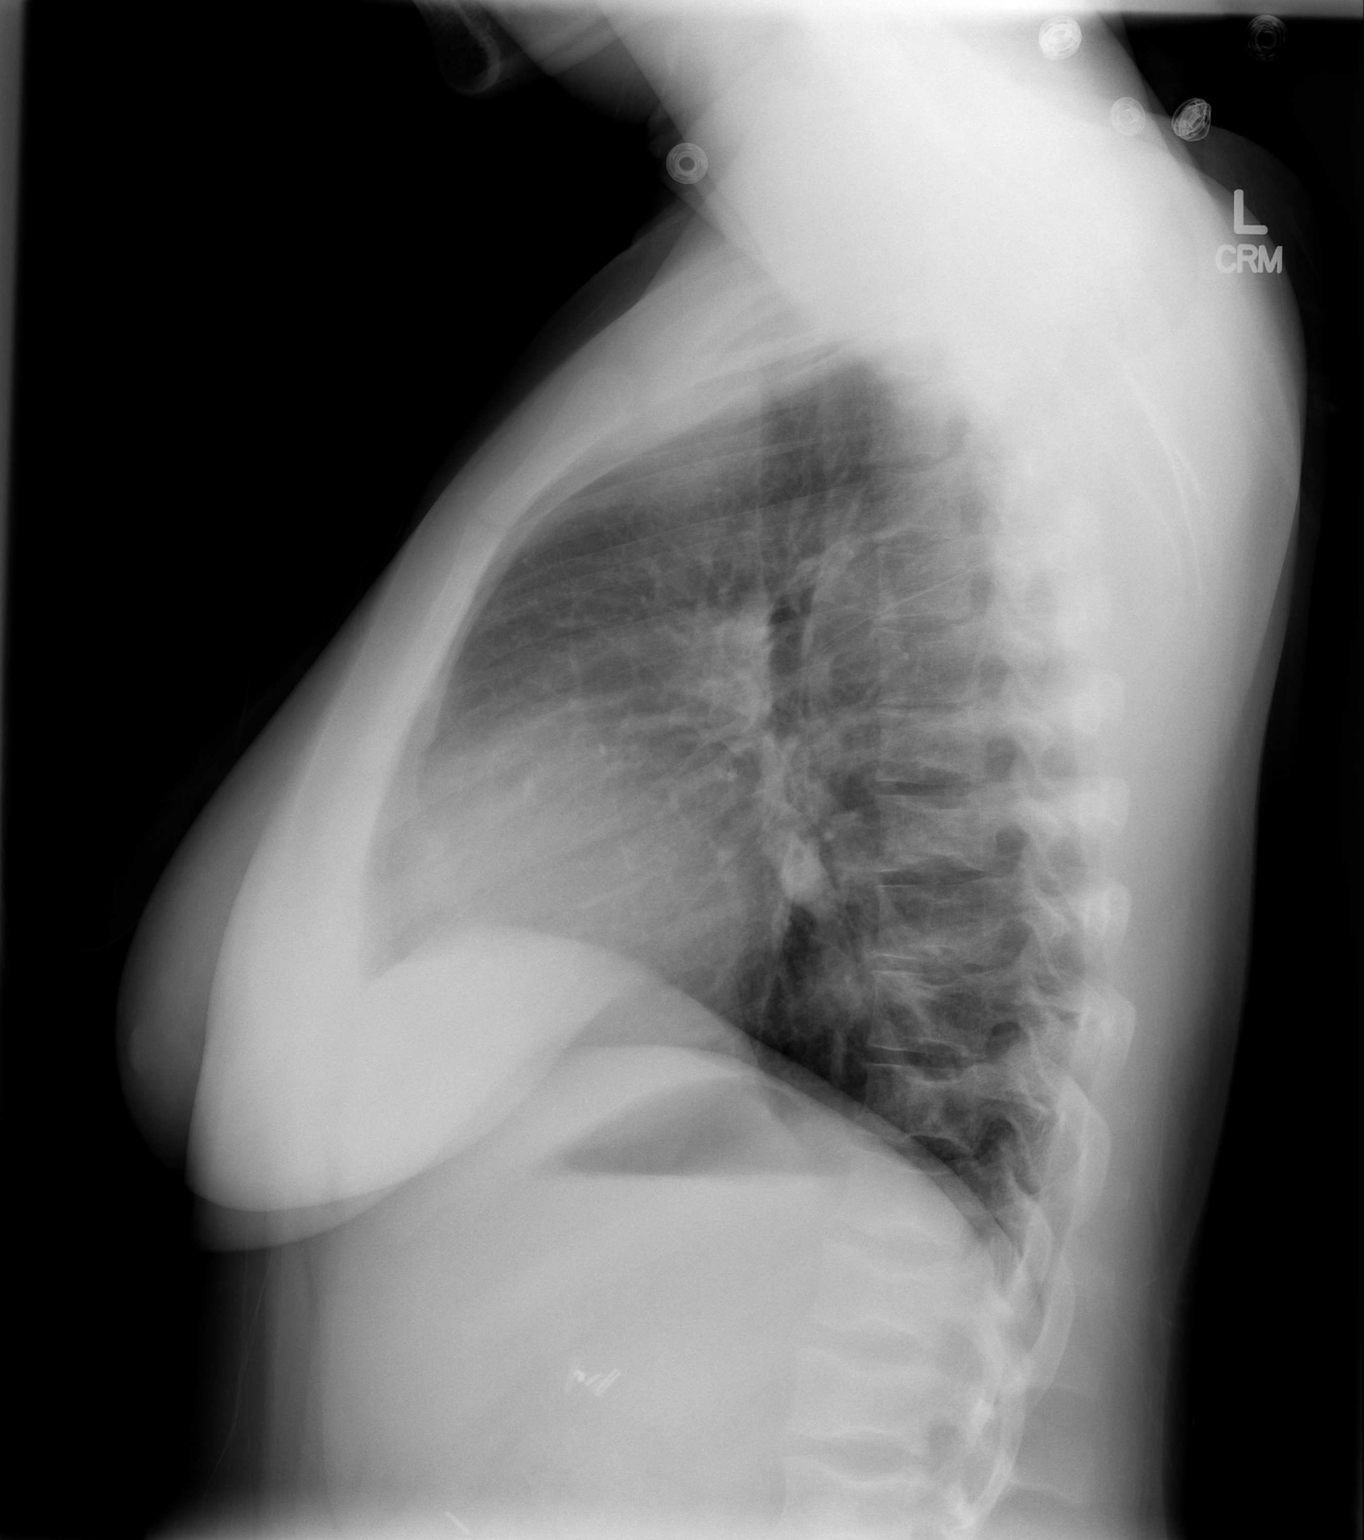

[2 of 2 positions shown; findings below may reference images not displayed]

FINDINGS: No interval change.  Heart size upper normal limits to
mildly enlarged.  No focal consolidation.  No pleural effusion or
pneumothorax.  Surgical clips right upper quadrant.  Sclerotic
changes of the humeral heads and multiple vertebral body endplate
irregularities, in keeping with known sickle cell disease.
IMPRESSION: Heart size upper normal limits to mildly enlarged.  No focal
consolidation.

Osseous changes of sickle cell disease involving the humeral heads
and vertebral bodies.

## 2012-03-23 ENCOUNTER — Encounter (HOSPITAL_COMMUNITY): Payer: Self-pay | Admitting: Emergency Medicine

## 2012-03-23 DIAGNOSIS — O99019 Anemia complicating pregnancy, unspecified trimester: Principal | ICD-10-CM | POA: Insufficient documentation

## 2012-03-23 DIAGNOSIS — G8929 Other chronic pain: Secondary | ICD-10-CM | POA: Insufficient documentation

## 2012-03-23 DIAGNOSIS — Z8669 Personal history of other diseases of the nervous system and sense organs: Secondary | ICD-10-CM | POA: Insufficient documentation

## 2012-03-23 DIAGNOSIS — R110 Nausea: Secondary | ICD-10-CM | POA: Insufficient documentation

## 2012-03-23 DIAGNOSIS — D5700 Hb-SS disease with crisis, unspecified: Secondary | ICD-10-CM | POA: Insufficient documentation

## 2012-03-23 DIAGNOSIS — Z8659 Personal history of other mental and behavioral disorders: Secondary | ICD-10-CM | POA: Insufficient documentation

## 2012-03-23 DIAGNOSIS — Z8709 Personal history of other diseases of the respiratory system: Secondary | ICD-10-CM | POA: Insufficient documentation

## 2012-03-23 DIAGNOSIS — M549 Dorsalgia, unspecified: Secondary | ICD-10-CM | POA: Insufficient documentation

## 2012-03-23 DIAGNOSIS — Z79899 Other long term (current) drug therapy: Secondary | ICD-10-CM | POA: Insufficient documentation

## 2012-03-23 NOTE — ED Notes (Addendum)
Pt reports sickle cell crisis with back pain, nausea, and vomiting since 3pm. Pt states she is approx [redacted] weeks pregnant.

## 2012-03-24 ENCOUNTER — Observation Stay (HOSPITAL_COMMUNITY)
Admission: EM | Admit: 2012-03-24 | Discharge: 2012-03-25 | Disposition: A | Discharge status: Home / Self Care | Payer: Medicaid Other | Attending: Emergency Medicine | Admitting: Emergency Medicine

## 2012-03-24 ENCOUNTER — Encounter (HOSPITAL_COMMUNITY): Payer: Self-pay | Admitting: *Deleted

## 2012-03-24 DIAGNOSIS — Z3490 Encounter for supervision of normal pregnancy, unspecified, unspecified trimester: Secondary | ICD-10-CM

## 2012-03-24 DIAGNOSIS — D5700 Hb-SS disease with crisis, unspecified: Secondary | ICD-10-CM

## 2012-03-24 LAB — BASIC METABOLIC PANEL
BUN: 8 mg/dL (ref 6–23)
CO2: 24 mEq/L (ref 19–32)
Chloride: 104 mEq/L (ref 96–112)
GFR calc non Af Amer: >90 mL/min (ref >90)
Glucose, Bld: 92 mg/dL (ref 70–99)
Potassium: 3.8 mEq/L (ref 3.5–5.1)
Sodium: 138 mEq/L (ref 135–145)

## 2012-03-24 LAB — CBC WITH DIFFERENTIAL/PLATELET
Eosinophils Absolute: 0.6 10*3/uL (ref 0.0–0.7)
Eosinophils Relative: 3 % (ref 0–5)
HCT: 27.4 % — ABNORMAL LOW (ref 36.0–46.0)
Lymphocytes Relative: 24 % (ref 12–46)
Lymphs Abs: 4.4 10*3/uL — ABNORMAL HIGH (ref 0.7–4.0)
MCH: 31.5 pg (ref 26.0–34.0)
MCV: 88.1 fL (ref 78.0–100.0)
Monocytes Absolute: 1.9 10*3/uL — ABNORMAL HIGH (ref 0.1–1.0)
Monocytes Relative: 10 % (ref 3–12)
Platelets: 573 10*3/uL — ABNORMAL HIGH (ref 150–400)
RBC: 3.11 MIL/uL — ABNORMAL LOW (ref 3.87–5.11)
WBC: 18.6 10*3/uL — ABNORMAL HIGH (ref 4.0–10.5)

## 2012-03-24 LAB — URINALYSIS, ROUTINE W REFLEX MICROSCOPIC
Bilirubin Urine: NEGATIVE
Glucose, UA: NEGATIVE mg/dL
Hgb urine dipstick: NEGATIVE
Specific Gravity, Urine: 1.013 (ref 1.005–1.030)
Urobilinogen, UA: 1 mg/dL (ref 0.0–1.0)
pH: 6 (ref 5.0–8.0)

## 2012-03-24 LAB — POCT PREGNANCY, URINE: Preg Test, Ur: POSITIVE — AB

## 2012-03-24 LAB — RETICULOCYTES: Retic Ct Pct: 6.2 % — ABNORMAL HIGH (ref 0.4–3.1)

## 2012-03-24 MED ORDER — ALUM & MAG HYDROXIDE-SIMETH 200-200-20 MG/5ML PO SUSP
30.0000 mL | Freq: Once | ORAL | Status: AC
  Administered 2012-03-24: 30 mL via ORAL
  Filled 2012-03-24: qty 30

## 2012-03-24 MED ORDER — HYDROMORPHONE HCL PF 2 MG/ML IJ SOLN
2.0000 mg | Freq: Once | INTRAMUSCULAR | Status: AC
  Administered 2012-03-24: 2 mg via INTRAVENOUS
  Filled 2012-03-24: qty 1

## 2012-03-24 MED ORDER — SODIUM CHLORIDE 0.9 % IV SOLN
1000.0000 mL | Freq: Once | INTRAVENOUS | Status: AC
  Administered 2012-03-24: 1000 mL via INTRAVENOUS

## 2012-03-24 MED ORDER — SODIUM CHLORIDE 0.9 % IV BOLUS (SEPSIS)
1000.0000 mL | Freq: Once | INTRAVENOUS | Status: AC
  Administered 2012-03-24: 1000 mL via INTRAVENOUS

## 2012-03-24 MED ORDER — HYDROMORPHONE HCL PF 1 MG/ML IJ SOLN
1.0000 mg | Freq: Once | INTRAMUSCULAR | Status: AC
  Administered 2012-03-24: 1 mg via INTRAVENOUS
  Filled 2012-03-24: qty 1

## 2012-03-24 MED ORDER — ONDANSETRON HCL 4 MG/2ML IJ SOLN
4.0000 mg | Freq: Once | INTRAMUSCULAR | Status: AC
  Administered 2012-03-24: 4 mg via INTRAVENOUS
  Filled 2012-03-24: qty 2

## 2012-03-24 MED ORDER — ONDANSETRON HCL 4 MG/2ML IJ SOLN
4.0000 mg | Freq: Four times a day (QID) | INTRAMUSCULAR | Status: DC | PRN
  Administered 2012-03-24 (×2): 4 mg via INTRAVENOUS
  Filled 2012-03-24 (×4): qty 2

## 2012-03-24 MED ORDER — HYDROCODONE-ACETAMINOPHEN 5-325 MG PO TABS
2.0000 | ORAL_TABLET | ORAL | Status: DC | PRN
  Administered 2012-03-25: 2 via ORAL
  Filled 2012-03-24 (×2): qty 2

## 2012-03-24 MED ORDER — SODIUM CHLORIDE 0.9 % IV SOLN
1000.0000 mL | INTRAVENOUS | Status: DC
  Administered 2012-03-24 – 2012-03-25 (×3): 1000 mL via INTRAVENOUS

## 2012-03-24 MED ORDER — DIPHENHYDRAMINE HCL 50 MG/ML IJ SOLN: 12.5000 mg | Freq: Four times a day (QID) | INTRAMUSCULAR | Status: DC | PRN

## 2012-03-24 MED ORDER — HYDROMORPHONE HCL PF 1 MG/ML IJ SOLN
1.0000 mg | INTRAMUSCULAR | Status: DC | PRN
  Administered 2012-03-24 – 2012-03-25 (×10): 1 mg via INTRAVENOUS
  Filled 2012-03-24 (×10): qty 1

## 2012-03-24 NOTE — ED Notes (Signed)
Pt alert, NAD, calm, interactive, skin W&D, resps e/u, speaking with EMT at [facility], talkative, IV pain med given. Friend at [facility].

## 2012-03-24 NOTE — ED Notes (Signed)
Up from [facility], lying on R side, intermitantly moaning stating, "I just feel so bad". Boyfriend lying on floor next to her (on sheet, declined pillow).

## 2012-03-24 NOTE — ED Provider Notes (Signed)
Medical screening examination/treatment/procedure(s) were performed by non-physician practitioner and as supervising physician I was immediately available for consultation/collaboration.  Olivia Mackie, MD 03/24/12 2144

## 2012-03-24 NOTE — ED Provider Notes (Signed)
History     CSN: [id number]  Arrival date & time 03/23/12  2300   First MD Initiated Contact with Patient 03/24/12 0044      Chief Complaint  Patient presents with  . Sickle Cell Pain Crisis   HPI  History provided by the patient. Patient is a 21 year old female with history of sickle cell anemia and reports recent positive pregnancy test with last normal menstrual cycle in the early parts of October. Patient states that since she was pregnant her doctor had discontinued her from some of her normal sickle cell medications including hydroxyurea. Since that time she has had some increasing typical sickle cell pains in her low and mid back area. Patient has also reduced her home narcotic pain medications to once daily and states this has not been helping. She reports some fatigue but denies any shortness of breath or near syncope. She also has some associated nausea without any vomiting. She denies any vaginal bleeding or discharge. Denies any abdominal pain. Denies any urinary complaints.    Past Medical History  Diagnosis Date  . H/O: 1 miscarriage 03/22/2011  . 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 Depression 01/06/2011  . GERD (gastroesophageal reflux disease) 02/17/2011  . Trichotillomania     h/o  . Blood transfusion     "lots"  . Sickle cell anemia with crisis   . Exertional dyspnea     "sometimes"  . Sickle cell anemia   . Headache   . Migraines 11/08/11    "@ least twice/month"  . Chronic back pain     "very severe; have knot in my back; from tight muscle; take RX and exercise for it"  . Mood swings 11/08/11    "I go back and forth; real bad"    Past Surgical History  Procedure Date  . Cholecystectomy 05/2010  . Dilation and curettage of uterus 02/20/11    S/P miscarriage    Family History  Problem Relation Age of Onset  . Diabetes Mother     History  Substance Use Topics  . Smoking status: Never Smoker   . Smokeless tobacco: Never Used  . Alcohol Use: No     Comment:  pt states she quit in May    OB History    Grav Para Term Preterm Abortions TAB SAB Ect Mult Living   2    1           Review of Systems  Constitutional: Negative for fever, chills and appetite change.  Respiratory: Negative for cough and shortness of breath.   Cardiovascular: Negative for chest pain.  Gastrointestinal: Positive for nausea. Negative for vomiting, abdominal pain and diarrhea.  Genitourinary: Negative for dysuria, frequency, hematuria, flank pain, vaginal bleeding and vaginal discharge.  Musculoskeletal: Positive for back pain.    Allergies  Carrot oil; Latex; and Toradol  Home Medications   Current Outpatient Rx  Name  Route  Sig  Dispense  Refill  . FOLIC ACID 1 MG PO TABS   Oral   Take 1 tablet (1 mg total) by mouth daily.   30 tablet   3   . PRENATAL 27-0.8 MG PO TABS   Oral   Take 1 tablet by mouth daily.           BP 107/49  Pulse 97  Temp 99 F (37.2 C) (Oral)  Resp 20  SpO2 100%  LMP 01/29/2012  Physical Exam  Nursing note and vitals reviewed. Constitutional: She is oriented to person, place, and time.  She appears well-developed and well-nourished. No distress.  HENT:  Head: Normocephalic.  Cardiovascular: Normal rate and regular rhythm.   Pulmonary/Chest: Effort normal and breath sounds normal. No respiratory distress. She has no wheezes. She has no rales.  Abdominal: Soft. There is no tenderness. There is no rebound and no guarding.  Musculoskeletal: She exhibits no edema and no tenderness.  Neurological: She is alert and oriented to person, place, and time.  Skin: Skin is warm and dry. No rash noted.  Psychiatric: She has a normal mood and affect. Her behavior is normal.    ED Course  Procedures   Results for orders placed during the hospital encounter of 03/24/12  CBC WITH DIFFERENTIAL      Component Value Range   WBC 18.6 (*) 4.0 - 10.5 K/uL   RBC 3.11 (*) 3.87 - 5.11 MIL/uL   Hemoglobin 9.8 (*) 12.0 - 15.0 g/dL   HCT  16.1 (*) 09.6 - 46.0 %   MCV 88.1  78.0 - 100.0 fL   MCH 31.5  26.0 - 34.0 pg   MCHC 35.8  30.0 - 36.0 g/dL   RDW 04.5  40.9 - 81.1 %   Platelets 573 (*) 150 - 400 K/uL   Neutrophils Relative 62  43 - 77 %   Neutro Abs 11.6 (*) 1.7 - 7.7 K/uL   Lymphocytes Relative 24  12 - 46 %   Lymphs Abs 4.4 (*) 0.7 - 4.0 K/uL   Monocytes Relative 10  3 - 12 %   Monocytes Absolute 1.9 (*) 0.1 - 1.0 K/uL   Eosinophils Relative 3  0 - 5 %   Eosinophils Absolute 0.6  0.0 - 0.7 K/uL   Basophils Relative 0  0 - 1 %   Basophils Absolute 0.1  0.0 - 0.1 K/uL  RETICULOCYTES      Component Value Range   Retic Ct Pct 6.2 (*) 0.4 - 3.1 %   RBC. 3.11 (*) 3.87 - 5.11 MIL/uL   Retic Count, Manual 192.8 (*) 19.0 - 186.0 K/uL  BASIC METABOLIC PANEL      Component Value Range   Sodium 138  135 - 145 mEq/L   Potassium 3.8  3.5 - 5.1 mEq/L   Chloride 104  96 - 112 mEq/L   CO2 24  19 - 32 mEq/L   Glucose, Bld 92  70 - 99 mg/dL   BUN 8  6 - 23 mg/dL   Creatinine, Ser 9.14  0.50 - 1.10 mg/dL   Calcium 9.3  8.4 - 78.2 mg/dL   GFR calc non Af Amer >90  >90 mL/min   GFR calc Af Amer >90  >90 mL/min  URINALYSIS, ROUTINE W REFLEX MICROSCOPIC      Component Value Range   Color, Urine YELLOW  YELLOW   APPearance CLEAR  CLEAR   Specific Gravity, Urine 1.013  1.005 - 1.030   pH 6.0  5.0 - 8.0   Glucose, UA NEGATIVE  NEGATIVE mg/dL   Hgb urine dipstick NEGATIVE  NEGATIVE   Bilirubin Urine NEGATIVE  NEGATIVE   Ketones, ur NEGATIVE  NEGATIVE mg/dL   Protein, ur NEGATIVE  NEGATIVE mg/dL   Urobilinogen, UA 1.0  0.0 - 1.0 mg/dL   Nitrite NEGATIVE  NEGATIVE   Leukocytes, UA NEGATIVE  NEGATIVE  POCT PREGNANCY, URINE      Component Value Range   Preg Test, Ur POSITIVE (*) NEGATIVE       1. Sickle cell crisis   2. Pregnancy  MDM  12:50AM  patient seen and evaluated. Patient does not appear in any acute distress.  I discussed with patient the risks of using narcotic pain medications during pregnancy.  She states that she was are explained these risks by her sickle cell doctor, Dr. August Saucer and this is one pf the reason she reduced her narcotic home pain medications. Patient understands the risks and still would like stronger narcotic medicines to help with her sickle cell pain at this time.   Patient continues to not have significant improvement after medications. Patient seen and discussed with attending physician and will move to CDU under sickle cell protocol this time. Patient agrees with this plan and feels she will be ready to return home later in the day.   Angus Seller, [place] 03/24/12 [phone]

## 2012-03-24 NOTE — ED Provider Notes (Signed)
3:46 PM  Patient is resting in bed.  She states that her back is still hurting.  She states that she would like to go home, but feels that she is not quite ready yet.  Requests another dose of dilaudid.  PE: Gen: A&O x4 HEENT: PERRL, EOM CHEST: RRR, no m/r/g ABD: BS x 4, ND/NT EXT: No edema, strong peripheral pulses NEURO: Sensation and strength intact bilaterally  5:28 PM Patient sleeping in bed. 6:13 PM Patient still sleeping. VSS. 7:06 PM Patient states that she is still having pain.  I told her that if we are unsuccessful at treating her pain in the next 3 hours, she will need to be admitted.  The patient states that she would like to go home, but still feels that she is not ready. VSS.  12:09 AM Patient has been signed out to Dr. Dierdre Highman, who will continue care at this time.  Roxy Horseman, PA-C 03/25/12 0009

## 2012-03-24 NOTE — ED Notes (Signed)
EDP into room 

## 2012-03-24 NOTE — ED Notes (Signed)
Given pillow and PO ice water per request, c/o pain, askin for pain med.

## 2012-03-24 NOTE — ED Provider Notes (Addendum)
7:53 AM Patient is in CDU under observation, sickle cell protocol.  This is a shared visit with Dr Norlene Campbell.  Patient presented with her typical sickle cell pain in her lower back.  Pt is newly pregnant and has been taken off several of her home medications because of the pregnancy.  States she thinks she is in the hospital now because of the cold weather and because she is trying to adjust to being off of her medications.  She currently is reporting 8/10 pain in her back and is nauseated. Denies CP, SOB, vaginal bleeding.  States she had a miscarriage last year and she does not feel any of those symptoms currently, is "feeling lucky" with this baby.  States she has not been able to sleep well and is hoping to do that here.  Nurse Kriste Basque) is currently in the room and giving patient her previously scheduled pain and nausea medication.  Pt is A&O, NAD, RRR, CTAB, abd soft, NT, no guarding, no rebound.   11:35 AM Pt reporting relief with dilaudid.  Currently sleeping.  Will not disturb presently.  Pt continues on sickle cell protocol.    12:40 PM Sleeping soundly.   2:43 PM Patient reports her pain is "about the same," though she has been able to sleep.  Pt is currently groggy-appearing.  No new complaints.  Pt aware that she may be discharged home whenever she is ready.    3:16 PM Patient signed out to [employer], PA-C, who assumes care of patient at change of shift.     Trixie Dredge, [place] 03/24/12 1517    Reassumed care of patient in the morning.  03/25/12.   Patient has been discharged by Dr Dierdre Highman, now refusing to leave without presciptions for pain medications.  I have spoken with Dr Dierdre Highman who states he offered patient admission last night and patient was seen by medicine but refused admission.  He spoke with her last night giving her the options of admission vs discharge without prescriptions.  He has discussed with me that he does not think it is appropriate to discharge with medications.   8:22 AM I  have spoken with the patient and have given her the option again of admission vs discharge home without prescriptions.  She has chosen to be discharged home.    [place], [place] 03/25/12 1553

## 2012-03-24 NOTE — ED Notes (Signed)
(  denies: recent sickness, diarrhea, sob, cough congestion cold sx). Pt alert, NAD, calm, interactive, skin W&D, resps e/u, speaking in clear complete sentences, laughing and joking with friend at [facility], c/o sickle cell pain crisis/ back pain, and nv. Have not vomited in last 24 hrs. "hurts to even touch my back", ~ [redacted] weeks pregnant, no OB visit yet", states, "have been taking prenatal and folic acid, but not my hydroxurea, I think that is why I am having pain", blood transfusion on 11/1, "felt better after that".

## 2012-03-24 NOTE — ED Notes (Signed)
No changes, report given to CDU Leanna Battles, RN.

## 2012-03-24 NOTE — ED Notes (Signed)
MD at bedside. 

## 2012-03-24 NOTE — ED Provider Notes (Signed)
Medical screening examination/treatment/procedure(s) were conducted as a shared visit with non-physician practitioner(s) and myself.  I personally evaluated the patient during the encounter.  Pt with h/o sickle cell disease, recent +preg test.  Having typical flare, will place on cdu protocol due to persistent pain.  Olivia Mackie, MD 03/24/12 [phone]

## 2012-03-25 NOTE — ED Notes (Signed)
Notified EDP about plan for patient

## 2012-03-25 NOTE — ED Notes (Signed)
Informed patient of D/C orders and information pt wanted to speak with the EDP/ Informed PA Irving Burton

## 2012-03-25 NOTE — ED Notes (Addendum)
Pt asked to take prenatal meds from home because she had not taken one today/ Notified PA Browning/ PA review prenatal meds and approved for pt to take one for today/ prenatal meds are with patients boyfriend

## 2012-03-25 NOTE — ED Provider Notes (Signed)
Medical screening examination/treatment/procedure(s) were performed by non-physician practitioner and as supervising physician I was immediately available for consultation/collaboration.  Olivia Mackie, MD 03/25/12 [phone]

## 2012-03-28 ENCOUNTER — Inpatient Hospital Stay (HOSPITAL_COMMUNITY)
Admission: EM | Admit: 2012-03-28 | Discharge: 2012-04-04 | DRG: 781 | Disposition: A | Discharge status: Home / Self Care | Payer: Medicaid Other | Attending: Family Medicine | Admitting: Family Medicine

## 2012-03-28 ENCOUNTER — Encounter (HOSPITAL_COMMUNITY): Payer: Self-pay | Admitting: *Deleted

## 2012-03-28 ENCOUNTER — Emergency Department (HOSPITAL_COMMUNITY): Payer: Medicaid Other

## 2012-03-28 DIAGNOSIS — O99019 Anemia complicating pregnancy, unspecified trimester: Principal | ICD-10-CM | POA: Diagnosis present

## 2012-03-28 DIAGNOSIS — Z3490 Encounter for supervision of normal pregnancy, unspecified, unspecified trimester: Secondary | ICD-10-CM

## 2012-03-28 DIAGNOSIS — M549 Dorsalgia, unspecified: Secondary | ICD-10-CM

## 2012-03-28 DIAGNOSIS — F17200 Nicotine dependence, unspecified, uncomplicated: Secondary | ICD-10-CM

## 2012-03-28 DIAGNOSIS — D5700 Hb-SS disease with crisis, unspecified: Secondary | ICD-10-CM | POA: Diagnosis present

## 2012-03-28 DIAGNOSIS — O200 Threatened abortion: Secondary | ICD-10-CM | POA: Diagnosis present

## 2012-03-28 DIAGNOSIS — O99330 Smoking (tobacco) complicating pregnancy, unspecified trimester: Secondary | ICD-10-CM | POA: Diagnosis present

## 2012-03-28 DIAGNOSIS — D571 Sickle-cell disease without crisis: Secondary | ICD-10-CM

## 2012-03-28 DIAGNOSIS — F329 Major depressive disorder, single episode, unspecified: Secondary | ICD-10-CM

## 2012-03-28 DIAGNOSIS — D72829 Elevated white blood cell count, unspecified: Secondary | ICD-10-CM | POA: Diagnosis present

## 2012-03-28 DIAGNOSIS — K219 Gastro-esophageal reflux disease without esophagitis: Secondary | ICD-10-CM | POA: Diagnosis present

## 2012-03-28 DIAGNOSIS — F32A Depression, unspecified: Secondary | ICD-10-CM

## 2012-03-28 LAB — BASIC METABOLIC PANEL
BUN: 5 mg/dL — ABNORMAL LOW (ref 6–23)
CO2: 24 mEq/L (ref 19–32)
Chloride: 101 mEq/L (ref 96–112)
Creatinine, Ser: 0.56 mg/dL (ref 0.50–1.10)
GFR calc Af Amer: >90 mL/min (ref >90)
Potassium: 3.7 mEq/L (ref 3.5–5.1)

## 2012-03-28 LAB — CBC WITH DIFFERENTIAL/PLATELET
Basophils Absolute: 0 10*3/uL (ref 0.0–0.1)
Eosinophils Relative: 3 % (ref 0–5)
HCT: 28.3 % — ABNORMAL LOW (ref 36.0–46.0)
Hemoglobin: 9.8 g/dL — ABNORMAL LOW (ref 12.0–15.0)
Lymphocytes Relative: 24 % (ref 12–46)
Lymphs Abs: 3.9 10*3/uL (ref 0.7–4.0)
MCV: 89 fL (ref 78.0–100.0)
Monocytes Relative: 9 % (ref 3–12)
Neutro Abs: 10.3 10*3/uL — ABNORMAL HIGH (ref 1.7–7.7)
RBC: 3.18 MIL/uL — ABNORMAL LOW (ref 3.87–5.11)
RDW: 15.4 % (ref 11.5–15.5)
WBC: 16.1 10*3/uL — ABNORMAL HIGH (ref 4.0–10.5)

## 2012-03-28 LAB — RETICULOCYTES
Retic Count, Absolute: 279.3 10*3/uL — ABNORMAL HIGH (ref 19.0–186.0)
Retic Ct Pct: 9.5 % — ABNORMAL HIGH (ref 0.4–3.1)

## 2012-03-28 MED ORDER — ONDANSETRON HCL 4 MG/2ML IJ SOLN
4.0000 mg | Freq: Four times a day (QID) | INTRAMUSCULAR | Status: DC | PRN
  Administered 2012-03-29 – 2012-04-04 (×6): 4 mg via INTRAVENOUS
  Filled 2012-03-28 (×8): qty 2

## 2012-03-28 MED ORDER — ONDANSETRON HCL 4 MG/2ML IJ SOLN
4.0000 mg | INTRAMUSCULAR | Status: AC | PRN
  Administered 2012-03-28 – 2012-03-29 (×2): 4 mg via INTRAVENOUS
  Filled 2012-03-28 (×2): qty 2

## 2012-03-28 MED ORDER — SODIUM CHLORIDE 0.9 % IV SOLN: Freq: Once | INTRAVENOUS | Status: DC

## 2012-03-28 MED ORDER — HYDROMORPHONE HCL PF 1 MG/ML IJ SOLN
1.0000 mg | INTRAMUSCULAR | Status: DC | PRN
  Administered 2012-03-29 (×3): 1 mg via INTRAVENOUS
  Filled 2012-03-28 (×3): qty 1

## 2012-03-28 MED ORDER — HYDROMORPHONE HCL PF 1 MG/ML IJ SOLN
1.0000 mg | Freq: Once | INTRAMUSCULAR | Status: AC
  Administered 2012-03-28: 1 mg via INTRAVENOUS
  Filled 2012-03-28: qty 1

## 2012-03-28 MED ORDER — DEXTROSE-NACL 5-0.45 % IV SOLN
INTRAVENOUS | Status: DC
  Administered 2012-03-28: 23:00:00 via INTRAVENOUS

## 2012-03-28 NOTE — ED Notes (Signed)
Pt is here with lightheadedness and is having left sided back and pain in left arm.  PT was seen here 3 days ago and did not have any pain medication and has this chronic pain all the time.    Pt states this is her sickle cell pain

## 2012-03-28 NOTE — ED Notes (Signed)
Pt presented to ED with generalized body pain and nausea.

## 2012-03-28 NOTE — ED Provider Notes (Signed)
History     CSN: [id number]  Arrival date & time 03/28/12  1621   First MD Initiated Contact with Patient 03/28/12 1941      No chief complaint on file.   (Consider location/radiation/quality/duration/timing/severity/associated sxs/prior treatment) HPI Comments: 21 year old G2P1 ([redacted] week pregnant) female with a history of sickle cell anemia presents emergency department complaining of her typical sickle cell pain crisis type pain.  Patient states that she is followed by Dr. August Saucer who was recently taken her off hydroxyurea due to pregnancy as well as decreased her pain medications.  Patient states that she currently does not have any prescriptions and has been unable to tolerate pain.  Recent episode began at noon today and is described as muscular back pain associated with nausea.  Patient denies any vomiting, abdominal pain or cramping, vaginal bleeding, vaginal discharge, dysuria, urinary frequency, hematuria, fever, night sweats, chills, chest pain, shortness of breath.  Patient with recent observation for sickle cell pain crisis and refused admission.  Patient states that she attempted to get into the sickle cell pain clinic today however they were full.  Patient has no other complaints at this time.  Note the patient did not have an established OB/GYN and she has not had an ultrasound since she found out she was pregnant.  The history is provided by the patient.    Past Medical History  Diagnosis Date  . H/O: 1 miscarriage 03/22/2011  . 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 Depression 01/06/2011  . GERD (gastroesophageal reflux disease) 02/17/2011  . Trichotillomania     h/o  . Blood transfusion     "lots"  . Sickle cell anemia with crisis   . Exertional dyspnea     "sometimes"  . Sickle cell anemia   . Headache   . Migraines 11/08/11    "@ least twice/month"  . Chronic back pain     "very severe; have knot in my back; from tight muscle; take RX and exercise for it"  . Mood swings  11/08/11    "I go back and forth; real bad"  . Pregnancy     Past Surgical History  Procedure Date  . Cholecystectomy 05/2010  . Dilation and curettage of uterus 02/20/11    S/P miscarriage    Family History  Problem Relation Age of Onset  . Diabetes Mother     History  Substance Use Topics  . Smoking status: Never Smoker   . Smokeless tobacco: Never Used  . Alcohol Use: No     Comment: pt states she quit in May    OB History    Grav Para Term Preterm Abortions TAB SAB Ect Mult Living   2    1           Review of Systems  All other systems reviewed and are negative.    Allergies  Carrot oil; Latex; and Toradol  Home Medications   Current Outpatient Rx  Name  Route  Sig  Dispense  Refill  . FOLIC ACID 1 MG PO TABS   Oral   Take 1 tablet (1 mg total) by mouth daily.   30 tablet   3   . PRENATAL 27-0.8 MG PO TABS   Oral   Take 1 tablet by mouth daily.           BP 99/52  Pulse 97  Temp 98.8 F (37.1 C)  Resp 18  SpO2 100%  LMP 01/29/2012  Physical Exam  Constitutional: She is oriented to person,  place, and time. She appears well-developed and well-nourished. No distress.  HENT:  Head: Normocephalic and atraumatic.  Mouth/Throat: Oropharynx is clear and moist. No oropharyngeal exudate.  Eyes: Conjunctivae normal and EOM are normal. Pupils are equal, round, and reactive to light. No scleral icterus.  Neck: Normal range of motion. Neck supple. No tracheal deviation present. No thyromegaly present.  Cardiovascular: Normal rate, regular rhythm, normal heart sounds and intact distal pulses.   Pulmonary/Chest: Effort normal and breath sounds normal. No stridor. No respiratory distress. She has no wheezes.  Abdominal: Soft.  Musculoskeletal: Normal range of motion. She exhibits no edema and no tenderness.  Neurological: She is alert and oriented to person, place, and time. Coordination normal.  Skin: Skin is warm and dry. No rash noted. She is not  diaphoretic. No erythema. No pallor.  Psychiatric: She has a normal mood and affect. Her behavior is normal.    ED Course  Procedures (including critical care time)  Labs Reviewed  CBC WITH DIFFERENTIAL - Abnormal; Notable for the following:    WBC 16.1 (*)     RBC 3.18 (*)     Hemoglobin 9.8 (*)     HCT 28.3 (*)     Platelets 589 (*)     Neutro Abs 10.3 (*)     Monocytes Absolute 1.4 (*)     All other components within normal limits  BASIC METABOLIC PANEL - Abnormal; Notable for the following:    BUN 5 (*)     All other components within normal limits  RETICULOCYTES - Abnormal; Notable for the following:    Retic Ct Pct 9.5 (*)     RBC. 2.94 (*)     Retic Count, Manual 279.3 (*)     All other components within normal limits   US Ob Comp Less 14 Wks  03/28/2012  *RADIOLOGY REPORT*  Clinical Data: check for intrauterine preg,pain; ; PELVIC PAIN.  OBSTETRIC <14 WK [place] AND TRANSVAGINAL OB US  Technique: Both transabdominal and transvaginal ultrasound examinations were performed for complete evaluation of the gestation as well as the maternal uterus, adnexal regions, and pelvic cul-de-sac.  Comparison: None.  Findings: Single intrauterine gestation.  Mean sac diameter is 7.9 mm for estimated gestational age of [redacted] weeks 4 days.  No fetal pole currently.  Yolk sac is visualized.  Small subchorionic hemorrhage present.  2.9 cm simple appearing cyst in the right ovary.  Left corpus luteal cyst.  No free fluid.  IMPRESSION: 5-week-4-day intrauterine pregnancy.  No fetal pole currently. Small subchorionic hemorrhage.   Original Report Authenticated By: Charlett Nose, M.D.    US Ob Transvaginal  03/28/2012  *RADIOLOGY REPORT*  Clinical Data: check for intrauterine preg,pain; ; PELVIC PAIN.  OBSTETRIC <14 WK [place] AND TRANSVAGINAL OB US  Technique: Both transabdominal and transvaginal ultrasound examinations were performed for complete evaluation of the gestation as well as the maternal uterus, adnexal  regions, and pelvic cul-de-sac.  Comparison: None.  Findings: Single intrauterine gestation.  Mean sac diameter is 7.9 mm for estimated gestational age of [redacted] weeks 4 days.  No fetal pole currently.  Yolk sac is visualized.  Small subchorionic hemorrhage present.  2.9 cm simple appearing cyst in the right ovary.  Left corpus luteal cyst.  No free fluid.  IMPRESSION: 5-week-4-day intrauterine pregnancy.  No fetal pole currently. Small subchorionic hemorrhage.   Original Report Authenticated By: Charlett Nose, M.D.      No diagnosis found.   MDM  Sickle cell pain crisis &  intrauterine pregnancy   Labs imaging reviewed. Patient history and plan has been discussed with the CDU midlevel provider who is agreeable with transfer. This patient has also been seen and evaluated by the attending who agrees that the patient will benefit from observation. Patient will be placed on sickle cell pain protocol with re-evaluation pending. Patient stable prior to transfer, has no complaints, and is in NAD.          Jaci Carrel, [place] 03/28/12 2222

## 2012-03-28 NOTE — ED Provider Notes (Signed)
Medical screening examination/treatment/procedure(s) were conducted as a shared visit with non-physician practitioner(s) and myself.  I personally evaluated the patient during the encounter  [name] is a 21 y.o. female hx of sickle cell here with her typical sickle cell pain. Denies chest pain. Patient is [redacted] weeks pregnant by dates and hasn't have prenatal care. No vaginal bleeding. Labs showed H/H at baseline, reticulocyte increased appropriately. US showed 5 week live IUP with subchorionic hematoma. Patient placed in CDU for sickle cell protocol.    Richardean Canal, MD 03/28/12 2329

## 2012-03-29 ENCOUNTER — Encounter (HOSPITAL_COMMUNITY): Payer: Self-pay | Admitting: Family Medicine

## 2012-03-29 DIAGNOSIS — D5700 Hb-SS disease with crisis, unspecified: Secondary | ICD-10-CM | POA: Diagnosis present

## 2012-03-29 LAB — URINALYSIS, ROUTINE W REFLEX MICROSCOPIC
Bilirubin Urine: NEGATIVE
Glucose, UA: NEGATIVE mg/dL
Ketones, ur: NEGATIVE mg/dL
Leukocytes, UA: NEGATIVE
Nitrite: NEGATIVE
Protein, ur: NEGATIVE mg/dL
pH: 6.5 (ref 5.0–8.0)

## 2012-03-29 MED ORDER — MENTHOL 3 MG MT LOZG: 1.0000 | LOZENGE | OROMUCOSAL | Status: DC | PRN

## 2012-03-29 MED ORDER — GUAIFENESIN 100 MG/5ML PO SOLN: 15.0000 mL | ORAL | Status: DC | PRN

## 2012-03-29 MED ORDER — FOLIC ACID 1 MG PO TABS
1.0000 mg | ORAL_TABLET | Freq: Every day | ORAL | Status: DC
  Administered 2012-03-30 – 2012-04-02 (×4): 1 mg via ORAL
  Filled 2012-03-29 (×5): qty 1

## 2012-03-29 MED ORDER — PRENATAL MULTIVITAMIN CH
1.0000 | ORAL_TABLET | Freq: Every day | ORAL | Status: DC
  Administered 2012-03-30 – 2012-04-03 (×5): 1 via ORAL
  Filled 2012-03-29 (×7): qty 1

## 2012-03-29 MED ORDER — PROMETHAZINE HCL 25 MG/ML IJ SOLN
12.5000 mg | INTRAMUSCULAR | Status: DC | PRN
  Administered 2012-03-29: 25 mg via INTRAVENOUS
  Administered 2012-03-29 – 2012-04-01 (×8): 12.5 mg via INTRAVENOUS
  Filled 2012-03-29 (×10): qty 1

## 2012-03-29 MED ORDER — SODIUM CHLORIDE 0.9 % IV SOLN
INTRAVENOUS | Status: DC
  Administered 2012-03-29 – 2012-04-04 (×17): via INTRAVENOUS

## 2012-03-29 MED ORDER — ALUM & MAG HYDROXIDE-SIMETH 200-200-20 MG/5ML PO SUSP: 30.0000 mL | ORAL | Status: DC | PRN

## 2012-03-29 MED ORDER — TEMAZEPAM 15 MG PO CAPS: 15.0000 mg | ORAL_CAPSULE | Freq: Every evening | ORAL | Status: DC | PRN

## 2012-03-29 MED ORDER — HYDROMORPHONE HCL PF 1 MG/ML IJ SOLN
2.0000 mg | INTRAMUSCULAR | Status: DC | PRN
  Administered 2012-03-29 – 2012-03-31 (×20): 2 mg via INTRAVENOUS
  Filled 2012-03-29 (×5): qty 2
  Filled 2012-03-29: qty 1
  Filled 2012-03-29 (×6): qty 2
  Filled 2012-03-29: qty 1
  Filled 2012-03-29 (×8): qty 2

## 2012-03-29 NOTE — ED Provider Notes (Signed)
7:47 AM Handoff from Dr. Nicanor Alcon. Pt on SS protocol. Notes reviewed and previous ED/CDU visit 4 days ago reviewed.   Patient seen and examined.   Exam:  Gen NAD; Heart RRR, nml S1,S2, no m/r/g; Lungs CTAB; Abd soft, NT, no rebound or guarding; Ext 2+ pedal pulses bilaterally, no edema; MSK upper back/left shoulder pain.  Plan: Will continue to treat pain. Patient told we will have to make an admit/go home decision around 11am.   8:00 AM Reviewed UTD, mainstay of pain crisis during pregnancy is opioids, avoidance of triggers, avoid NSAIDs after 30 weeks. Will increase dilaudid 2mg  q2h for next 4 hrs. Re-eval at that time.   11:23 AM Patient's pain continues, unchanged after recent re-dosing. She has vomited. At this point I feel she will not be improved enough to be discharged at end of observation limit. She agrees with admission.   12:08 PM Spoke with sickle cell clinic who confirmed they do not take patient's who are pregnant. Dr. Mahala Menghini will see and admit. I asked sickle cell NP to have patient rounded on tomorrow. She will pass that along.   12:42 PM PRN phenergan and fluids 150cc/hr placed at request of admitting MD.   Renne Crigler, PA 03/29/12 1243

## 2012-03-29 NOTE — H&P (Addendum)
[name] is an 21 y.o. G76P0010 Unknown female.   Chief Complaint: Pain Crisis HPI: 21 y.o.G2P0010 @ 5 w 4 d by U/S admitted with sickle cell pain crisis.  She has a long h/o pain crisis, admitted multiple times in the past few months.  Reportedly got worse over last few days with onset of cold weather.  She sees Dr. August Saucer, who has her on chronic pain meds. Medicine did not want to admit her secondary to bilious emesis.  No emesis now-ate a great dinner.   Past Medical History  Diagnosis Date  . H/O: 1 miscarriage 03/22/2011  . 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 Depression 01/06/2011  . GERD (gastroesophageal reflux disease) 02/17/2011  . Trichotillomania     h/o  . Blood transfusion     "lots"  . Sickle cell anemia with crisis   . Exertional dyspnea     "sometimes"  . Sickle cell anemia   . Headache   . Migraines 11/08/11    "@ least twice/month"  . Chronic back pain     "very severe; have knot in my back; from tight muscle; take RX and exercise for it"  . Mood swings 11/08/11    "I go back and forth; real bad"  . Pregnancy     Past Surgical History  Procedure Date  . Cholecystectomy 05/2010  . Dilation and curettage of uterus 02/20/11    S/P miscarriage    Family History  Problem Relation Age of Onset  . Diabetes Mother    Social History:  reports that she has never smoked. She has never used smokeless tobacco. She reports that she uses illicit drugs (Marijuana and Other-see comments). She reports that she does not drink alcohol. Has stopped smoking weed after her last miscarriage.  Allergies:  Allergies  Allergen Reactions  . Carrot Oil Hives and Swelling  . Latex Rash  . Toradol (Ketorolac Tromethamine) Hives and Itching    Medications Prior to Admission  Medication Sig Dispense Refill  . folic acid (FOLVITE) 1 MG tablet Take 1 tablet (1 mg total) by mouth daily.  30 tablet  3  . Prenatal Vit-Fe Fumarate-FA (MULTIVITAMIN-PRENATAL) 27-0.8 MG TABS Take 1 tablet by  mouth daily.         Review of systems not obtained due to patient factors. Patient has excessive somnolence.  Blood pressure 110/59, pulse 97, temperature 98.4 F (36.9 C), temperature source Oral, resp. rate 16, height 5\' 3"  (1.6 m), weight 85.73 kg (189 lb), last menstrual period 01/29/2012, SpO2 100.00%, unknown if currently breastfeeding. BP 110/59  Pulse 97  Temp 98.4 F (36.9 C) (Oral)  Resp 16  Ht 5\' 3"  (1.6 m)  Wt 85.73 kg (189 lb)  BMI 33.48 kg/m2  SpO2 100%  LMP 01/29/2012  Breastfeeding? Unknown General appearance: alert, cooperative and appears stated age Neck: no adenopathy, supple, symmetrical, trachea midline and thyroid not enlarged, symmetric, no tenderness/mass/nodules Lungs: normal effort Heart: regular rate and rhythm Abdomen: soft, non-tender; bowel sounds normal; no masses,  no organomegaly Extremities: extremities normal, atraumatic, no cyanosis or edema Pulses: 2+ and symmetric Skin: Skin color, texture, turgor normal. No rashes or lesions Lymph nodes: Cervical, supraclavicular, and axillary nodes normal.   Lab Results  Component Value Date   WBC 16.1* 03/28/2012   HGB 9.8* 03/28/2012   HCT 28.3* 03/28/2012   MCV 89.0 03/28/2012   PLT 589* 03/28/2012   Lab Results  Component Value Date   PREGTESTUR POSITIVE* 03/24/2012   PREGSERUM POSITIVE* 03/15/2012  CMP     Component Value Date/Time   NA 135 03/28/2012 1807   K 3.7 03/28/2012 1807   CL 101 03/28/2012 1807   CO2 24 03/28/2012 1807   GLUCOSE 85 03/28/2012 1807   BUN 5* 03/28/2012 1807   CREATININE 0.56 03/28/2012 1807   CREATININE 0.59 06/13/2011 1142   CALCIUM 9.6 03/28/2012 1807   PROT 6.8 03/15/2012 0547   ALBUMIN 3.5 03/15/2012 0547   AST 17 03/15/2012 0547   ALT 10 03/15/2012 0547   ALKPHOS 53 03/15/2012 0547   BILITOT 1.4* 03/15/2012 0547   GFRNONAA >90 03/28/2012 1807   GFRAA >90 03/28/2012 1807   Urinalysis    Component Value Date/Time   COLORURINE YELLOW 03/29/2012  1937   APPEARANCEUR CLOUDY* 03/29/2012 1937   LABSPEC 1.012 03/29/2012 1937   PHURINE 6.5 03/29/2012 1937   GLUCOSEU NEGATIVE 03/29/2012 1937   HGBUR NEGATIVE 03/29/2012 1937   HGBUR negative 01/08/2009 1329   BILIRUBINUR NEGATIVE 03/29/2012 1937   KETONESUR NEGATIVE 03/29/2012 1937   PROTEINUR NEGATIVE 03/29/2012 1937   UROBILINOGEN 1.0 03/29/2012 1937   NITRITE NEGATIVE 03/29/2012 1937   LEUKOCYTESUR NEGATIVE 03/29/2012 1937     US Ob Comp Less 14 Wks  03/28/2012  *RADIOLOGY REPORT*  Clinical Data: check for intrauterine preg,pain; ; PELVIC PAIN.  OBSTETRIC <14 WK [place] AND TRANSVAGINAL OB US  Technique: Both transabdominal and transvaginal ultrasound examinations were performed for complete evaluation of the gestation as well as the maternal uterus, adnexal regions, and pelvic cul-de-sac.  Comparison: None.  Findings: Single intrauterine gestation.  Mean sac diameter is 7.9 mm for estimated gestational age of [redacted] weeks 4 days.  No fetal pole currently.  Yolk sac is visualized.  Small subchorionic hemorrhage present.  2.9 cm simple appearing cyst in the right ovary.  Left corpus luteal cyst.  No free fluid.  IMPRESSION: 5-week-4-day intrauterine pregnancy.  No fetal pole currently. Small subchorionic hemorrhage.   Original Report Authenticated By: Charlett Nose, M.D.    US Ob Transvaginal  03/28/2012  *RADIOLOGY REPORT*  Clinical Data: check for intrauterine preg,pain; ; PELVIC PAIN.  OBSTETRIC <14 WK [place] AND TRANSVAGINAL OB US  Technique: Both transabdominal and transvaginal ultrasound examinations were performed for complete evaluation of the gestation as well as the maternal uterus, adnexal regions, and pelvic cul-de-sac.  Comparison: None.  Findings: Single intrauterine gestation.  Mean sac diameter is 7.9 mm for estimated gestational age of [redacted] weeks 4 days.  No fetal pole currently.  Yolk sac is visualized.  Small subchorionic hemorrhage present.  2.9 cm simple appearing cyst in the right ovary.   Left corpus luteal cyst.  No free fluid.  IMPRESSION: 5-week-4-day intrauterine pregnancy.  No fetal pole currently. Small subchorionic hemorrhage.   Original Report Authenticated By: Charlett Nose, M.D.       Assessment/Plan Patient Active Problem List  Diagnosis  . SICKLE CELL ANEMIA  . TRICHOTILLOMANIA  . Active smoker  . Back pain  . Depression  . GERD (gastroesophageal reflux disease)  . Contraception management  . Stress  . Overweight  . Vaso-occlusive sickle cell crisis  . Right thigh pain  . Vaginosis  . Tachycardia  . Headache  . Nausea & vomiting   IV hydration Pain control SCM consult in am.  Faria Casella S 03/29/2012, 9:37 PM

## 2012-03-30 LAB — COMPREHENSIVE METABOLIC PANEL
Albumin: 3.4 g/dL — ABNORMAL LOW (ref 3.5–5.2)
Alkaline Phosphatase: 66 U/L (ref 39–117)
BUN: 7 mg/dL (ref 6–23)
Calcium: 8.8 mg/dL (ref 8.4–10.5)
GFR calc Af Amer: >90 mL/min (ref >90)
Glucose, Bld: 95 mg/dL (ref 70–99)
Potassium: 3.9 mEq/L (ref 3.5–5.1)
Total Protein: 6.6 g/dL (ref 6.0–8.3)

## 2012-03-30 LAB — CBC
HCT: 25.2 % — ABNORMAL LOW (ref 36.0–46.0)
Hemoglobin: 8.6 g/dL — ABNORMAL LOW (ref 12.0–15.0)
MCH: 30.7 pg (ref 26.0–34.0)
MCHC: 34.1 g/dL (ref 30.0–36.0)
RDW: 15.7 % — ABNORMAL HIGH (ref 11.5–15.5)

## 2012-03-30 NOTE — ED Provider Notes (Signed)
Medical screening examination/treatment/procedure(s) were performed by non-physician practitioner and as supervising physician I was immediately available for consultation/collaboration.  Aaryn Sermon R. Aharon Carriere, MD 03/30/12 1056 

## 2012-03-30 NOTE — Progress Notes (Signed)
Subjective: Patient reports nausea.  Still having pain but getting relief right now with scheduled medication, IV fluid.  Objective: I have reviewed patient's vital signs, medications and labs.  General: alert, cooperative and appears stated age GI: soft, non-tender; bowel sounds normal; no masses,  no organomegaly Extremities: extremities normal, atraumatic, no cyanosis or edema CBC    Component Value Date/Time   WBC 15.5* 03/30/2012 0535   RBC 2.80* 03/30/2012 0535   HGB 8.6* 03/30/2012 0535   HCT 25.2* 03/30/2012 0535   PLT 530* 03/30/2012 0535   MCV 90.0 03/30/2012 0535   MCH 30.7 03/30/2012 0535   MCHC 34.1 03/30/2012 0535   RDW 15.7* 03/30/2012 0535   LYMPHSABS 3.9 03/28/2012 1807   MONOABS 1.4* 03/28/2012 1807   EOSABS 0.5 03/28/2012 1807   BASOSABS 0.0 03/28/2012 1807     Assessment/Plan: Hemoglobin dropped today. Will continue IV fluids and pain meds.   SCM team to see pt. Today.  LOS: 2 days    PRATT,TANYA S 03/30/2012, 8:06 AM

## 2012-03-30 NOTE — Progress Notes (Signed)
UR Chart review completed.  

## 2012-03-31 ENCOUNTER — Encounter (HOSPITAL_COMMUNITY): Payer: Self-pay | Admitting: Anesthesiology

## 2012-03-31 DIAGNOSIS — D571 Sickle-cell disease without crisis: Secondary | ICD-10-CM

## 2012-03-31 DIAGNOSIS — D72829 Elevated white blood cell count, unspecified: Secondary | ICD-10-CM | POA: Diagnosis present

## 2012-03-31 MED ORDER — HYDROMORPHONE HCL PF 1 MG/ML IJ SOLN
3.0000 mg | INTRAMUSCULAR | Status: DC | PRN
  Administered 2012-03-31 – 2012-04-01 (×9): 3 mg via INTRAVENOUS
  Filled 2012-03-31 (×9): qty 3

## 2012-03-31 NOTE — Consult Note (Signed)
[facility] Medical Consultation  [name] ZOX:[id number] DOB: 20-Nov-1990 DOA: 03/28/2012 PCP: Willey Blade, MD   Requesting physician: Dr. Penne Lash Date of consultation: 03/31/2012 Reason for consultation: Management of Sickle Cell Crisis  Impression/Recommendations Principal Problem:  Sickle cell pain crisis: Pt in active sickle cell pain crisis. Bilirubin is at 2.1 which is only just slightly higher than her baseline of 1.7 thus not reflective of significant hemolysis. However based on her pain scale, her pain is still poorly controlled. I will increase dilaudid to 3 mg every 2 hours. Will evaluate effectiveness for the next 24 hours. If not effective will transition to high dose PCA.  I have discussed  With patient the risk of fetal dependency and the importance of preventing withdrawal as this will also be experienced by the fetus.  I will followup again tomorrow. Please contact me if I can be of assistance in the meanwhile. Thank you for this consultation.  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 Will continue to monitor. Transfuse to keep Hgb > 8.5  Leukocytosis: Likely secondary to inflammatory process associated with pain crisis. Will monitor and recommend CBC with diff in am.   Chief Complaint: Pain  HPI:  Pt presently having a vaso-occlusive episode associated with her Hb SS sickle cell disease which according to patient began a few hours before presentation to the ED. She is presently approximately [redacted] weeks pregnant (Pt has had a previous spontaneous abortion in the past) and is admitted to [facility] for small sud-chorionic hemorrhage which is now resolved.  Pt however complains of pain which is located in the left upper back and shoulder. Her baseline pain is usually 4-5/10 however pain is presently at 8/10. She states that current therapy decreases pain to approximately 6/10 and relief lasts only 11/2 hours. She is presently  taking her Folic Acid but Hydrea has been discontinued secondary to pregnancy. Hgb electrophoresis from 03/08/2012 shows Hb SS 73%, Hb F 23.9%.     Review of Systems:  All other systems negative except as noted in HPI.  Past Medical History  Diagnosis Date  . H/O: 1 miscarriage 03/22/2011  . 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 Depression 01/06/2011  . GERD (gastroesophageal reflux disease) 02/17/2011  . Trichotillomania     h/o  . Blood transfusion     "lots"  . Sickle cell anemia with crisis   . Exertional dyspnea     "sometimes"  . Sickle cell anemia   . Headache   . Migraines 11/08/11    "@ least twice/month"  . Chronic back pain     "very severe; have knot in my back; from tight muscle; take RX and exercise for it"  . Mood swings 11/08/11    "I go back and forth; real bad"  . Pregnancy    Past Surgical History  Procedure Date  . Cholecystectomy 05/2010  . Dilation and curettage of uterus 02/20/11    S/P miscarriage   Social History:  reports that she has never smoked. She has never used smokeless tobacco. She reports that she uses illicit drugs (Marijuana and Other-see comments). She reports that she does not drink alcohol.  Allergies  Allergen Reactions  . Carrot Oil Hives and Swelling  . Latex Rash  . Toradol (Ketorolac Tromethamine) Hives and Itching   Family History  Problem Relation Age of Onset  . Diabetes Mother     Prior to Admission medications   Medication Sig Start Date End Date Taking? Authorizing  Provider  folic acid (FOLVITE) 1 MG tablet Take 1 tablet (1 mg total) by mouth daily. 03/15/12  Yes Gwenyth Bender, MD  Prenatal Vit-Fe Fumarate-FA (MULTIVITAMIN-PRENATAL) 27-0.8 MG TABS Take 1 tablet by mouth daily.   Yes Historical Provider, MD   Physical Exam: Blood pressure 94/62, pulse 91, temperature 98.6 F (37 C), temperature source Oral, resp. rate 18, height 5\' 3"  (1.6 m), weight 85.73 kg (189 lb), last menstrual period 01/29/2012, SpO2 99.00%, unknown if  currently breastfeeding. Filed Vitals:   03/30/12 1751 03/30/12 2133 03/31/12 0530 03/31/12 1342  BP: 111/33 105/58 104/52 94/62  Pulse: 103 97 98 91  Temp: 98.4 F (36.9 C) 98.6 F (37 C) 98.4 F (36.9 C) 98.6 F (37 C)  TempSrc: Oral Oral Oral Oral  Resp: 18 20 18 18   Height:      Weight:      SpO2: 100% 100% 99% 99%   General: Initially sleeping but easily arousable, oriented x3, in no acute distress. HEENT: [place]/AT PEERL, EOMI  Neck: Trachea midline, no masses, no thyromegal,y no JVD, no carotid bruit  OROPHARYNX: Moist, No exudate/ erythema/lesions.  Heart: Regular rate and rhythm, without murmurs, rubs, gallops, PMI non-displaced, no heaves or thrills on palpation.  Lungs: Clear to auscultation, no wheezing or rhonchi noted. Abdomen: Soft,positive bowel sounds, no masses no hepatosplenomegaly noted.  Neuro: No focal neurological deficits noted.  Musculoskeletal: No warm swelling or erythema around joints, no spinal tenderness noted.    Labs on Admission:  Basic Metabolic Panel:  Lab 03/30/12 1610 03/28/12 1807  NA 135 135  K 3.9 3.7  CL 102 101  CO2 25 24  GLUCOSE 95 85  BUN 7 5*  CREATININE 0.57 0.56  CALCIUM 8.8 9.6  MG -- --  PHOS -- --   Liver Function Tests:  Lab 03/30/12 0535  AST 16  ALT 6  ALKPHOS 66  BILITOT 2.1*  PROT 6.6  ALBUMIN 3.4*   No results found for this basename: LIPASE:5,AMYLASE:5 in the last 168 hours No results found for this basename: AMMONIA:5 in the last 168 hours CBC:  Lab 03/30/12 0535 03/28/12 1807  WBC 15.5* 16.1*  NEUTROABS -- 10.3*  HGB 8.6* 9.8*  HCT 25.2* 28.3*  MCV 90.0 89.0  PLT 530* 589*      Time spent: 1 hour.  Jhamal Plucinski A. Pager [phone]  If 7PM-7AM, please contact night-coverage  03/31/2012, 3:26 PM

## 2012-03-31 NOTE — Progress Notes (Signed)
Subjective: Patient reports continued pain.  No vaginal bleeding or discharge.  Objective: I have reviewed patient's vital signs, medications, labs and radiology results.  General: alert, cooperative and no distress GI: soft, non-tender; bowel sounds normal; no masses,  no organomegaly Extremities: Homans sign is negative, no sign of DVT Vaginal Bleeding: none   Assessment/Plan: 21 year old female [redacted] weeks pregnant with sickle cell crisis. No gyn issue at this time. Patient will need follow [place] in 10 days to assess viability. Sickle Cell Service will round on patient today and make recommendations on treatment.   LOS: 3 days    LEGGETT,KELLY H. 03/31/2012, 10:24 AM

## 2012-04-01 MED ORDER — HYDROMORPHONE HCL PF 2 MG/ML IJ SOLN
4.0000 mg | INTRAMUSCULAR | Status: DC | PRN
  Administered 2012-04-01 – 2012-04-02 (×10): 4 mg via INTRAVENOUS
  Filled 2012-04-01 (×10): qty 2

## 2012-04-01 MED ORDER — FAMOTIDINE 20 MG PO TABS
20.0000 mg | ORAL_TABLET | Freq: Two times a day (BID) | ORAL | Status: DC
  Administered 2012-04-01 – 2012-04-04 (×7): 20 mg via ORAL
  Filled 2012-04-01 (×7): qty 1

## 2012-04-01 NOTE — Consult Note (Signed)
PROGRESS NOTE  [name] OZH:[id number] DOB: 02-21-1991 DOA: 03/28/2012 PCP: August Saucer ERIC, MD  Assessment/Plan: Principal Problem:  Sickle cell pain crisis:Pt in active sickle cell pain crisis.  However based on her pain scale, her pain is still poorly controlled. I will increase dilaudid to 4mg  every 2 hours. Will evaluate effectiveness for the next 24 hours. If not effective will transition to high dose PCA.     Sickle cell hemolytic anemia: No new labs today. Will check CBC in am. Transfuse to keep Hgb > 8.5  Pt was offered transfer to [facility] for remainder of hospital stay. She refused citing psychosocial issues and possible misunderstanding of  interaction with staff. She feels that transferring will be too stressful for her at this time. For now patient will remain at [facility] and we will be happy to follow.  Code Status: Full Family Communication: Fiance at bedside and updated Disposition Plan: Home at discharge  MATTHEWS,MICHELLE A.  [facility] Pager [phone]. If 7PM-7AM, please contact night-coverage. 04/01/2012, 3:17 PM  LOS: 4 days   Brief narrative: Pt presently having a vaso-occlusive episode associated with her Hb SS sickle cell disease which according to patient began a few hours before presentation to the ED. She is presently approximately [redacted] weeks pregnant (Pt has had a previous spontaneous abortion in the past) and is admitted to [facility] for small sud-chorionic hemorrhage which is now resolved.  Pt however complains of pain which is located in the left upper back and shoulder. Her baseline pain is usually 4-5/10 however pain is presently at 8/10. She states that current therapy decreases pain to approximately 6/10 and relief lasts only 11/2 hours. She is presently taking her Folic Acid but Hydrea has been discontinued secondary to pregnancy. Hgb electrophoresis from 03/08/2012 shows Hb SS 73%, Hb F  23.9%    Procedures:  None  Antibiotics:  None  HPI/Subjective: Pt  Continues to localize her pain in the left upper back and shoulder. Her baseline pain is usually 4-5/10 however pain is presently at 7/10. She states that current therapy decreases pain to approximately 5-6/10 and relief lasts 2  hours. She is presently taking her Folic Acid but Hydrea has been discontinued secondary to pregnancy. Hgb electrophoresis from 03/08/2012 shows Hb SS 73%, Hb F 23.9%   Objective: Filed Vitals:   03/31/12 1730 03/31/12 2134 04/01/12 0540 04/01/12 1200  BP: 122/66 118/57 95/62 111/68  Pulse: 110 99 98 102  Temp: 98.6 F (37 C) 98.5 F (36.9 C) 98.1 F (36.7 C) 98.4 F (36.9 C)  TempSrc: Oral Oral Oral Oral  Resp: 18 18 18 20   Height:      Weight:   88.111 kg (194 lb 4 oz)   SpO2: 97% 98% 99% 97%   Weight change: 2.381 kg (5 lb 4 oz)  Intake/Output Summary (Last 24 hours) at 04/01/12 1517 Last data filed at 04/01/12 1413  Gross per 24 hour  Intake   1920 ml  Output   3600 ml  Net  -1680 ml    General: Alert, awake, oriented x3, in no acute distress. Fiance at bedside. OROPHARYNX:  Moist, No exudate/ erythema/lesions.  Heart: Regular rate and rhythm, without murmurs, rubs, gallops.  Lungs: Clear to auscultation, no wheezing or rhonchi noted.  Abdomen: Soft, nontender, nondistended, positive bowel sounds,no hepatosplenomegaly noted..  Neuro: No focal neurological deficits noted. Strength functional in bilateral upper and lower extremities. Musculoskeletal: No warm swelling or erythema around joints, no spinal tenderness noted. Psychiatric:  Patient alert and oriented x3, good insight and cognition, good recent to remote recall. Pleasant affect.    Data Reviewed: Basic Metabolic Panel:  Lab 03/30/12 4782 03/28/12 1807  NA 135 135  K 3.9 3.7  CL 102 101  CO2 25 24  GLUCOSE 95 85  BUN 7 5*  CREATININE 0.57 0.56  CALCIUM 8.8 9.6  MG -- --  PHOS -- --   Liver  Function Tests:  Lab 03/30/12 0535  AST 16  ALT 6  ALKPHOS 66  BILITOT 2.1*  PROT 6.6  ALBUMIN 3.4*   No results found for this basename: LIPASE:5,AMYLASE:5 in the last 168 hours No results found for this basename: AMMONIA:5 in the last 168 hours CBC:  Lab 03/30/12 0535 03/28/12 1807  WBC 15.5* 16.1*  NEUTROABS -- 10.3*  HGB 8.6* 9.8*  HCT 25.2* 28.3*  MCV 90.0 89.0  PLT 530* 589*   Cardiac Enzymes: No results found for this basename: CKTOTAL:5,CKMB:5,CKMBINDEX:5,TROPONINI:5 in the last 168 hours BNP (last 3 results)  Basename 11/21/11 1220 10/26/11 0720 10/25/11 0337  PROBNP 16.2 29.9 41.7   CBG: No results found for this basename: GLUCAP:5 in the last 168 hours  No results found for this or any previous visit (from the past 240 hour(s)).   Studies: US Abdomen Complete  03/14/2012  *RADIOLOGY REPORT*  Clinical Data:  Sickle cell crisis.  Bilateral flank pain.  COMPLETE ABDOMINAL ULTRASOUND  Comparison:  CT abdomen 06/21/2011  Findings:  Gallbladder:  Surgically absent  Common bile duct:  Normal at 5 mm.  Liver:  No focal lesion identified.  Within normal limits in parenchymal echogenicity.  IVC:  Appears normal.  Pancreas:  No focal abnormality seen.  Spleen:  Small and  lobular  Right Kidney:  11.1cm in length.  No evidence of hydronephrosis or stones.  Left Kidney:  11.5cm in length.  No evidence of hydronephrosis or stones.  Abdominal aorta:  No aneurysm identified.  IMPRESSION:  1.  No acute abdominal findings.  2.  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 3.  No hydronephrosis.   Original Report Authenticated By: Genevive Bi, M.D.    US Ob Comp Less 14 Wks  03/28/2012  *RADIOLOGY REPORT*  Clinical Data: check for intrauterine preg,pain; ; PELVIC PAIN.  OBSTETRIC <14 WK [place] AND TRANSVAGINAL OB US  Technique: Both transabdominal and transvaginal ultrasound examinations were performed for complete evaluation of the gestation as well as the maternal uterus,  adnexal regions, and pelvic cul-de-sac.  Comparison: None.  Findings: Single intrauterine gestation.  Mean sac diameter is 7.9 mm for estimated gestational age of [redacted] weeks 4 days.  No fetal pole currently.  Yolk sac is visualized.  Small subchorionic hemorrhage present.  2.9 cm simple appearing cyst in the right ovary.  Left corpus luteal cyst.  No free fluid.  IMPRESSION: 5-week-4-day intrauterine pregnancy.  No fetal pole currently. Small subchorionic hemorrhage.   Original Report Authenticated By: Charlett Nose, M.D.    US Ob Transvaginal  03/28/2012  *RADIOLOGY REPORT*  Clinical Data: check for intrauterine preg,pain; ; PELVIC PAIN.  OBSTETRIC <14 WK [place] AND TRANSVAGINAL OB US  Technique: Both transabdominal and transvaginal ultrasound examinations were performed for complete evaluation of the gestation as well as the maternal uterus, adnexal regions, and pelvic cul-de-sac.  Comparison: None.  Findings: Single intrauterine gestation.  Mean sac diameter is 7.9 mm for estimated gestational age of [redacted] weeks 4 days.  No fetal pole currently.  Yolk sac is visualized.  Small  subchorionic hemorrhage present.  2.9 cm simple appearing cyst in the right ovary.  Left corpus luteal cyst.  No free fluid.  IMPRESSION: 5-week-4-day intrauterine pregnancy.  No fetal pole currently. Small subchorionic hemorrhage.   Original Report Authenticated By: Charlett Nose, M.D.    Dg Chest Port 1 View  03/08/2012  *RADIOLOGY REPORT*  Clinical Data: PICC line placement.  PORTABLE CHEST - 1 VIEW  Comparison: Single view chest 03/08/2012 at 08:08 p.m.  Findings: The PICC line has been repositioned.  The tip is now in the proximal SVC, at the level of the azygos vein.  The heart size is normal.  Lungs are clear.  IMPRESSION:  1.  Repositioning of the left-sided PICC line with the tip in the proximal SVC.   Original Report Authenticated By: Jamesetta Orleans. MATTERN, M.D.    Dg Chest Port 1 View  03/08/2012  *RADIOLOGY REPORT*  Clinical Data:  Confirm PICC line placement  PORTABLE CHEST - 1 VIEW  Comparison: 01/08/2012  Findings:  Grossly unchanged cardiac silhouette and mediastinal contours.  No focal parenchymal opacities.  There is persistent mild elevation of the right hemidiaphragm.  No definite pleural effusion or pneumothorax.  Interval placement of left upper extremity approach PICC line with tip coursing up the expected location of the left internal jugular vein, tip excluded from view.  IMPRESSION:  Malpositioned left upper extremity approach PICC line with tip coursing up the expected location of the left internal jugular vein, tip excluded from view.  Repositioning is advised.  This was made a call report.   Original Report Authenticated By: Waynard Reeds, M.D.     Scheduled Meds:   . famotidine  20 mg Oral BID  . folic acid  1 mg Oral Daily  . prenatal multivitamin  1 tablet Oral Daily   Continuous Infusions:   . sodium chloride 150 mL/hr at 04/01/12 1100    Principal Problem:  *Sickle cell pain crisis Active Problems:  SICKLE CELL ANEMIA  Leukocytosis  Sickle cell hemolytic anemia

## 2012-04-01 NOTE — Progress Notes (Signed)
Subjective: Patient reports no vaginal bleeding.  No cramping.  Pt states pain is much improved.  Patient has symptoms of reflux  Objective: I have reviewed patient's vital signs, medications and labs.  General: alert, cooperative and no distress GI: soft, non tender, non distended Extremities: Homans sign is negative, no sign of DVT Vaginal Bleeding: none   Assessment/Plan: 21 yo G2P0010  At 6 weeks 1 day with sickle cell crisis doing better  1-Needs viability [place] in 1 week and 1 day. 2-Continue prenatal vitamins 3-Zantac for reflux symptoms 4-Suggested to patient to read "What to expect when you're expecting" to give her more information on her pregnancy   LOS: 4 days    Yoel Kaufhold H. 04/01/2012, 6:18 AM

## 2012-04-02 DIAGNOSIS — M549 Dorsalgia, unspecified: Secondary | ICD-10-CM

## 2012-04-02 LAB — CBC WITH DIFFERENTIAL/PLATELET
Basophils Absolute: 0.2 10*3/uL — ABNORMAL HIGH (ref 0.0–0.1)
HCT: 21.2 % — ABNORMAL LOW (ref 36.0–46.0)
Lymphocytes Relative: 19 % (ref 12–46)
Monocytes Relative: 11 % (ref 3–12)
Neutro Abs: 12.1 10*3/uL — ABNORMAL HIGH (ref 1.7–7.7)
Platelets: 389 10*3/uL (ref 150–400)
RDW: 18.5 % — ABNORMAL HIGH (ref 11.5–15.5)
WBC: 19 10*3/uL — ABNORMAL HIGH (ref 4.0–10.5)

## 2012-04-02 LAB — URINALYSIS, ROUTINE W REFLEX MICROSCOPIC
Bilirubin Urine: NEGATIVE
Hgb urine dipstick: NEGATIVE
Protein, ur: NEGATIVE mg/dL
Urobilinogen, UA: 0.2 mg/dL (ref 0.0–1.0)

## 2012-04-02 LAB — COMPREHENSIVE METABOLIC PANEL
ALT: 24 U/L (ref 0–35)
AST: 24 U/L (ref 0–37)
Alkaline Phosphatase: 66 U/L (ref 39–117)
CO2: 28 mEq/L (ref 19–32)
Chloride: 101 mEq/L (ref 96–112)
GFR calc non Af Amer: >90 mL/min (ref >90)
Potassium: 4.2 mEq/L (ref 3.5–5.1)
Sodium: 137 mEq/L (ref 135–145)
Total Bilirubin: 2.1 mg/dL — ABNORMAL HIGH (ref 0.3–1.2)

## 2012-04-02 IMAGING — CR DG CHEST 2V
2 series · 2 of 2 positions shown · non-contrast
Comparison: 05/06/2011

CLINICAL DATA: Weakness, dry cough, right flank pain, frequent
urination

CHEST - 2 VIEW

[w chest pa]
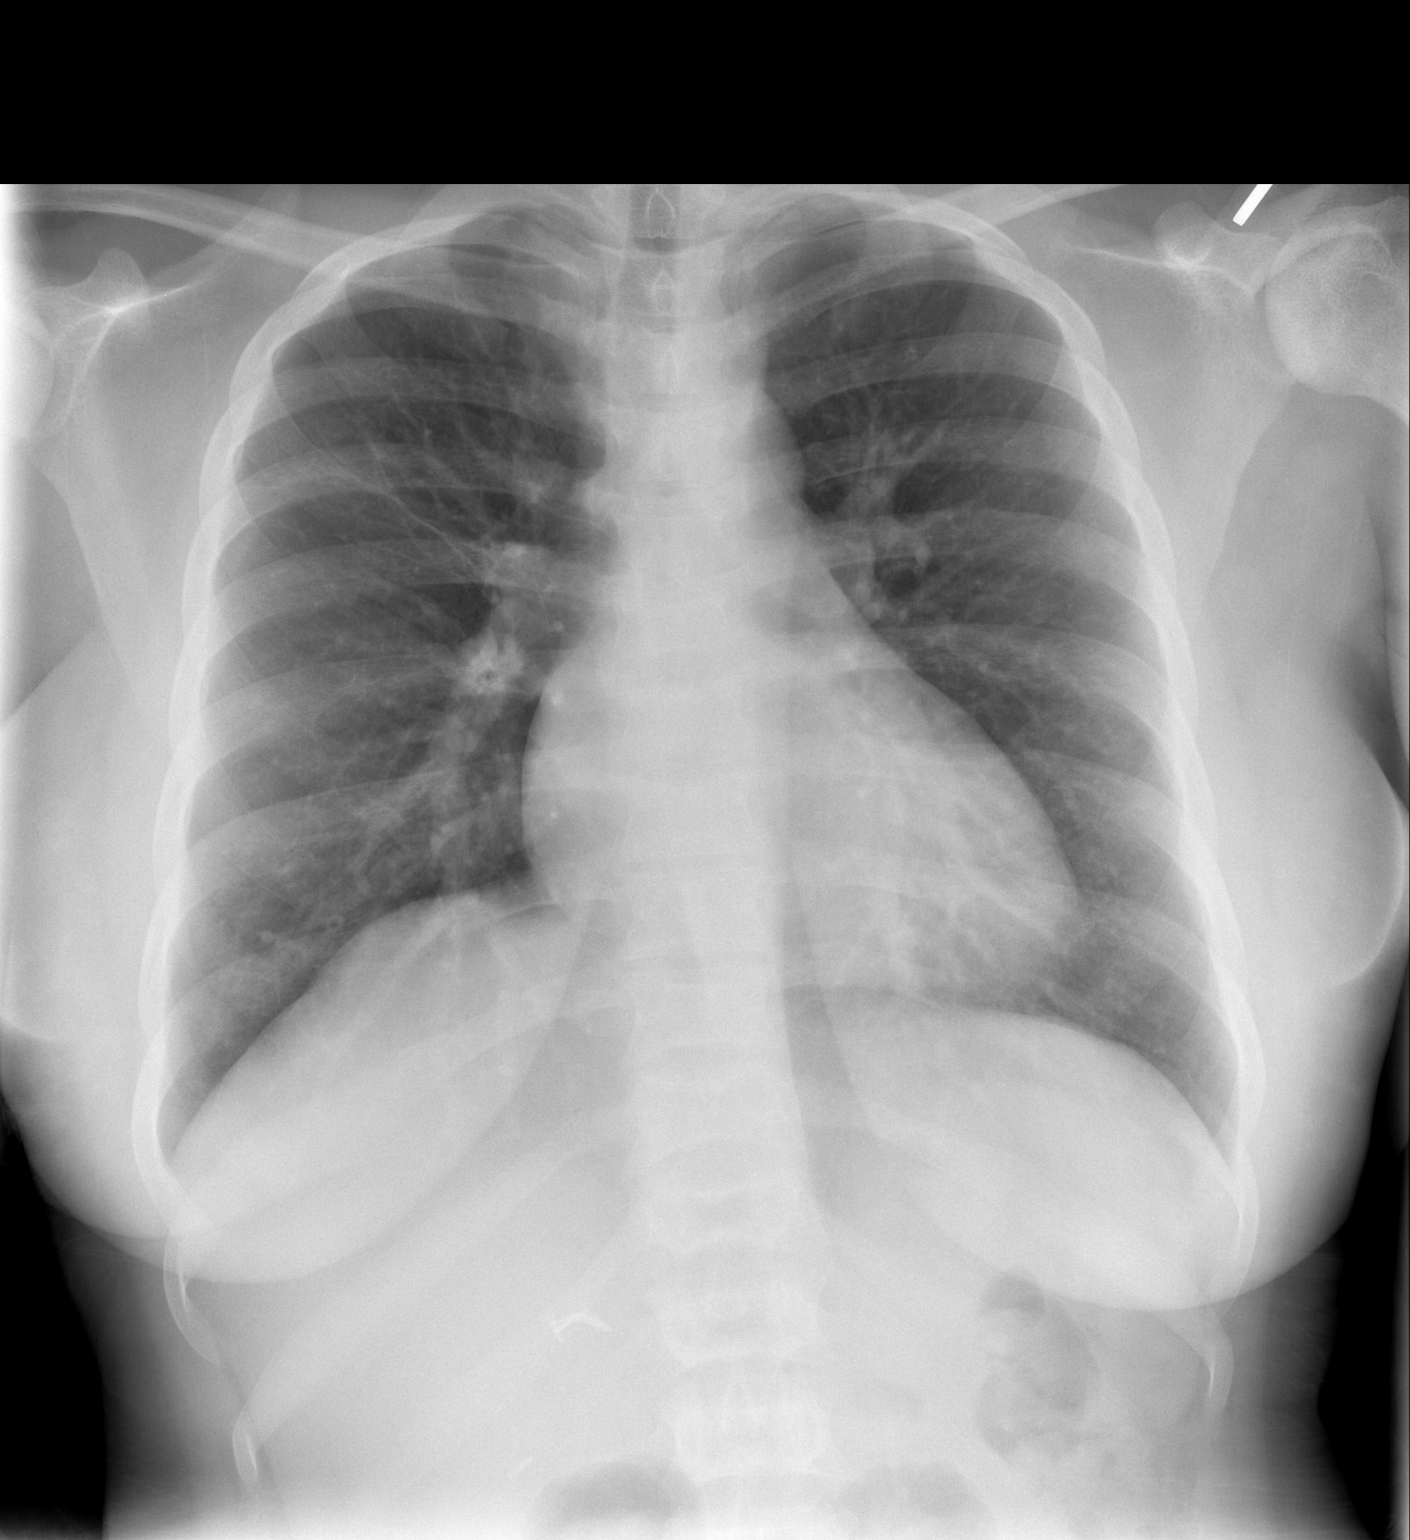

[w chest lat]
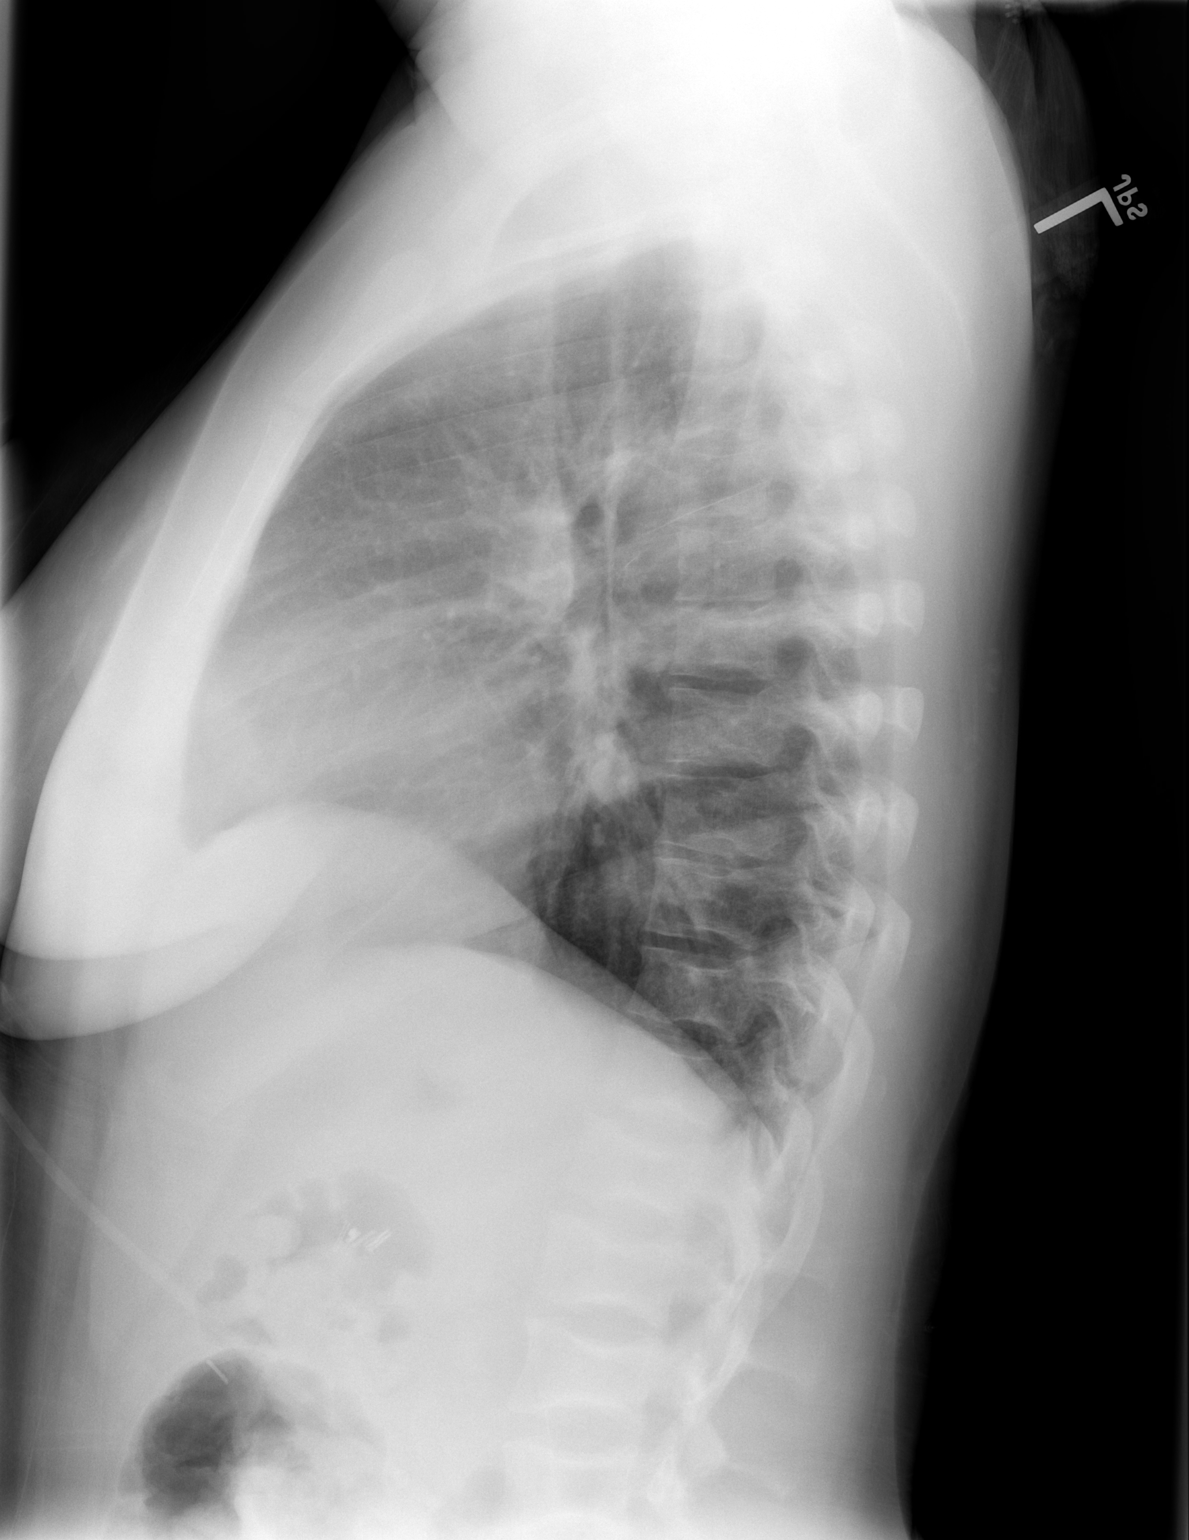

[2 of 2 positions shown; findings below may reference images not displayed]

FINDINGS: The heart size and pulmonary vascularity are normal. The
lungs appear clear and expanded without focal air space disease or
consolidation. No blunting of the costophrenic angles.  Vertebral
bone changes consistent with sickle cell disease.  Surgical clips
in the right upper quadrant.  No significant change since previous
study.
IMPRESSION: The no evidence of active pulmonary disease.  Vertebral changes
consistent with sickle cell disease.

## 2012-04-02 MED ORDER — SODIUM CHLORIDE 0.9 % IJ SOLN: 9.0000 mL | INTRAMUSCULAR | Status: DC | PRN

## 2012-04-02 MED ORDER — HYDROMORPHONE HCL PF 1 MG/ML IJ SOLN
1.0000 mg | INTRAMUSCULAR | Status: DC | PRN
  Administered 2012-04-02: 1 mg via INTRAVENOUS
  Filled 2012-04-02: qty 1

## 2012-04-02 MED ORDER — DIPHENHYDRAMINE HCL 50 MG/ML IJ SOLN: 12.5000 mg | Freq: Four times a day (QID) | INTRAMUSCULAR | Status: DC | PRN

## 2012-04-02 MED ORDER — NALOXONE HCL 0.4 MG/ML IJ SOLN: 0.4000 mg | INTRAMUSCULAR | Status: DC | PRN

## 2012-04-02 MED ORDER — DIPHENHYDRAMINE HCL 12.5 MG/5ML PO ELIX: 12.5000 mg | ORAL_SOLUTION | Freq: Four times a day (QID) | ORAL | Status: DC | PRN

## 2012-04-02 MED ORDER — HYDROMORPHONE 0.3 MG/ML IV SOLN
INTRAVENOUS | Status: DC
  Administered 2012-04-02: 2.6 mg via INTRAVENOUS
  Administered 2012-04-02: 20:00:00 via INTRAVENOUS
  Administered 2012-04-03: 0.6 mg via INTRAVENOUS
  Administered 2012-04-03: 20:00:00 via INTRAVENOUS
  Administered 2012-04-03: 2.39 mg via INTRAVENOUS
  Administered 2012-04-03: 2.1 mg via INTRAVENOUS
  Administered 2012-04-03: 4.8 mg via INTRAVENOUS
  Administered 2012-04-04: 07:00:00 via INTRAVENOUS
  Administered 2012-04-04: 5 mL via INTRAVENOUS
  Administered 2012-04-04: 15 mL via INTRAVENOUS
  Administered 2012-04-04: 2.49 mg via INTRAVENOUS
  Administered 2012-04-04: 1.5 mg via INTRAVENOUS
  Filled 2012-04-02 (×4): qty 25

## 2012-04-02 NOTE — Consult Note (Signed)
PROGRESS NOTE  [name] ZOX:[id number] DOB: January 10, 1991 DOA: 03/28/2012 PCP: August Saucer ERIC, MD  Assessment/Plan: Principal Problem:  Sickle cell pain crisis:Pt in active sickle cell pain crisis.  However based on her pain scale 6/10, her pain is still poorly controlled. I will change to a dilaudid PCA to give her 9.5mg  in 4 hours.      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 May be partially due to hemodilution but likely hemolytic. Will  transfuse 1 unit PRBC.    Leukocytosis: WBC up to 19 today which is likely secondary to inflammatory process. Pt would benefit from Toradol. However has had bleeding whic is a relative contraindication. Will defer to Ob-Gyn as to safety of use in pt's current obstetrical state  Pt was offered transfer to [facility] for remainder of hospital stay. She refused citing psychosocial issues and possible misunderstanding of  interaction with staff. She feels that transferring will be too stressful for her at this time. For now patient will remain at [facility] and we will be happy to follow.  Code Status: Full Family Communication: Not applicable  Disposition Plan: Home at discharge  MATTHEWS,MICHELLE A.  [facility] Pager [phone]. If 7PM-7AM, please contact night-coverage. 04/02/2012, 6:43 PM  LOS: 5 days   Brief narrative: Pt presently having a vaso-occlusive episode associated with her Hb SS sickle cell disease which according to patient began a few hours before presentation to the ED. She is presently approximately [redacted] weeks pregnant (Pt has had a previous spontaneous abortion in the past) and is admitted to [facility] for small sud-chorionic hemorrhage which is now resolved.  Pt however complains of pain which is located in the left upper back and shoulder. Her baseline pain is usually 4-5/10 however pain is presently at 6/10. She states that current therapy decreases pain to approximately 6/10 and relief lasts only 11/2  hours. She is presently taking her Folic Acid but Hydrea has been discontinued secondary to pregnancy. Hgb electrophoresis from 03/08/2012 shows Hb SS 73%, Hb F 23.9%    Procedures:  None  Antibiotics:  None  HPI/Subjective: Pt   localizes her pain to the low back. Her baseline pain is usually 4-5/10 however pain is presently at 7/10. She states that current therapy decreases pain to approximately 6/10 and relief lasting less than 2  hours. She is presently taking her Folic Acid but Hydrea has been discontinued secondary to pregnancy. Hgb electrophoresis from 03/08/2012 shows Hb SS 73%, Hb F 23.9%   Objective: Filed Vitals:   04/02/12 0555 04/02/12 0747 04/02/12 1200 04/02/12 1800  BP: 91/50  110/72 113/71  Pulse: 101  94 96  Temp: 98 F (36.7 C)  98.5 F (36.9 C) 98.5 F (36.9 C)  TempSrc: Oral  Oral Oral  Resp: 18  16 18   Height:  5\' 3"  (1.6 m)    Weight: 88.905 kg (196 lb) 88.905 kg (196 lb)    SpO2:   95% 100%   Weight change: 0.794 kg (1 lb 12 oz)  Intake/Output Summary (Last 24 hours) at 04/02/12 1843 Last data filed at 04/02/12 1815  Gross per 24 hour  Intake   1195 ml  Output   3950 ml  Net  -2755 ml    General: Alert, awake, oriented x3, in no acute distress. Pt is stressed as she has had a small amount of bleeding and is concerned about a spontaneous abortion. OROPHARYNX:  Moist, No exudate/ erythema/lesions.  Heart: Regular rate  and rhythm, without murmurs, rubs, gallops.  Lungs: Clear to auscultation, no wheezing or rhonchi noted.  Abdomen: Soft, nontender, nondistended, positive bowel sounds,no hepatosplenomegaly noted.. . Musculoskeletal: No warm swelling or erythema around joints, no spinal tenderness noted. Psychiatric: Patient alert and oriented x3, good insight and cognition, good recent to remote recall. Tearful    Data Reviewed: Basic Metabolic Panel:  Lab 04/02/12 2956 03/30/12 0535 03/28/12 1807  NA 137 135 135  K 4.2 3.9 3.7  CL 101 102  101  CO2 28 25 24   GLUCOSE 101* 95 85  BUN 7 7 5*  CREATININE 0.68 0.57 0.56  CALCIUM 8.8 8.8 9.6  MG -- -- --  PHOS -- -- --   Liver Function Tests:  Lab 04/02/12 0525 03/30/12 0535  AST 24 16  ALT 24 6  ALKPHOS 66 66  BILITOT 2.1* 2.1*  PROT 5.9* 6.6  ALBUMIN 3.1* 3.4*   No results found for this basename: LIPASE:5,AMYLASE:5 in the last 168 hours No results found for this basename: AMMONIA:5 in the last 168 hours CBC:  Lab 04/02/12 0525 03/30/12 0535 03/28/12 1807  WBC 19.0* 15.5* 16.1*  NEUTROABS 12.1* -- 10.3*  HGB 7.0* 8.6* 9.8*  HCT 21.2* 25.2* 28.3*  MCV 93.4 90.0 89.0  PLT 389 530* 589*   Cardiac Enzymes: No results found for this basename: CKTOTAL:5,CKMB:5,CKMBINDEX:5,TROPONINI:5 in the last 168 hours BNP (last 3 results)  Basename 11/21/11 1220 10/26/11 0720 10/25/11 0337  PROBNP 16.2 29.9 41.7   CBG: No results found for this basename: GLUCAP:5 in the last 168 hours  No results found for this or any previous visit (from the past 240 hour(s)).   Studies: US Abdomen Complete  03/14/2012  *RADIOLOGY REPORT*  Clinical Data:  Sickle cell crisis.  Bilateral flank pain.  COMPLETE ABDOMINAL ULTRASOUND  Comparison:  CT abdomen 06/21/2011  Findings:  Gallbladder:  Surgically absent  Common bile duct:  Normal at 5 mm.  Liver:  No focal lesion identified.  Within normal limits in parenchymal echogenicity.  IVC:  Appears normal.  Pancreas:  No focal abnormality seen.  Spleen:  Small and  lobular  Right Kidney:  11.1cm in length.  No evidence of hydronephrosis or stones.  Left Kidney:  11.5cm in length.  No evidence of hydronephrosis or stones.  Abdominal aorta:  No aneurysm identified.  IMPRESSION:  1.  No acute abdominal findings.  2.  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 3.  No hydronephrosis.   Original Report Authenticated By: Genevive Bi, M.D.    US Ob Comp Less 14 Wks  03/28/2012  *RADIOLOGY REPORT*  Clinical Data: check for intrauterine  preg,pain; ; PELVIC PAIN.  OBSTETRIC <14 WK [place] AND TRANSVAGINAL OB US  Technique: Both transabdominal and transvaginal ultrasound examinations were performed for complete evaluation of the gestation as well as the maternal uterus, adnexal regions, and pelvic cul-de-sac.  Comparison: None.  Findings: Single intrauterine gestation.  Mean sac diameter is 7.9 mm for estimated gestational age of [redacted] weeks 4 days.  No fetal pole currently.  Yolk sac is visualized.  Small subchorionic hemorrhage present.  2.9 cm simple appearing cyst in the right ovary.  Left corpus luteal cyst.  No free fluid.  IMPRESSION: 5-week-4-day intrauterine pregnancy.  No fetal pole currently. Small subchorionic hemorrhage.   Original Report Authenticated By: Charlett Nose, M.D.    US Ob Transvaginal  03/28/2012  *RADIOLOGY REPORT*  Clinical Data: check for intrauterine preg,pain; ; PELVIC PAIN.  OBSTETRIC <14 WK [place]  AND TRANSVAGINAL OB US  Technique: Both transabdominal and transvaginal ultrasound examinations were performed for complete evaluation of the gestation as well as the maternal uterus, adnexal regions, and pelvic cul-de-sac.  Comparison: None.  Findings: Single intrauterine gestation.  Mean sac diameter is 7.9 mm for estimated gestational age of [redacted] weeks 4 days.  No fetal pole currently.  Yolk sac is visualized.  Small subchorionic hemorrhage present.  2.9 cm simple appearing cyst in the right ovary.  Left corpus luteal cyst.  No free fluid.  IMPRESSION: 5-week-4-day intrauterine pregnancy.  No fetal pole currently. Small subchorionic hemorrhage.   Original Report Authenticated By: Charlett Nose, M.D.    Dg Chest Port 1 View  03/08/2012  *RADIOLOGY REPORT*  Clinical Data: PICC line placement.  PORTABLE CHEST - 1 VIEW  Comparison: Single view chest 03/08/2012 at 08:08 p.m.  Findings: The PICC line has been repositioned.  The tip is now in the proximal SVC, at the level of the azygos vein.  The heart size is normal.  Lungs are clear.   IMPRESSION:  1.  Repositioning of the left-sided PICC line with the tip in the proximal SVC.   Original Report Authenticated By: Jamesetta Orleans. MATTERN, M.D.    Dg Chest Port 1 View  03/08/2012  *RADIOLOGY REPORT*  Clinical Data: Confirm PICC line placement  PORTABLE CHEST - 1 VIEW  Comparison: 01/08/2012  Findings:  Grossly unchanged cardiac silhouette and mediastinal contours.  No focal parenchymal opacities.  There is persistent mild elevation of the right hemidiaphragm.  No definite pleural effusion or pneumothorax.  Interval placement of left upper extremity approach PICC line with tip coursing up the expected location of the left internal jugular vein, tip excluded from view.  IMPRESSION:  Malpositioned left upper extremity approach PICC line with tip coursing up the expected location of the left internal jugular vein, tip excluded from view.  Repositioning is advised.  This was made a call report.   Original Report Authenticated By: Waynard Reeds, M.D.     Scheduled Meds:    . famotidine  20 mg Oral BID  . folic acid  1 mg Oral Daily  . prenatal multivitamin  1 tablet Oral Daily   Continuous Infusions:    . sodium chloride 150 mL/hr at 04/02/12 1258    Principal Problem:  *Sickle cell pain crisis Active Problems:  SICKLE CELL ANEMIA  Leukocytosis  Sickle cell hemolytic anemia

## 2012-04-02 NOTE — Progress Notes (Signed)
Patient complaining of constant pain which is unrelieved from Diludad PCA. Dr. Willey Blade paged with orders to  Give 1mg  IV Diludad q4H PRN for breakthrough pain. Will continue to monitor patient.

## 2012-04-02 NOTE — Progress Notes (Signed)
Pt. Stated after coming from restroom she wiped herself and noted some bleeding. Pt was instructed to place a V-pad on. 0630p.m. Dr.paged regarding status Awaiting orders.No bleeding on pad yet.

## 2012-04-02 NOTE — Progress Notes (Signed)
Ur chart review completed.  

## 2012-04-02 NOTE — Progress Notes (Signed)
Subjective: Patient reports increased pain today.  Reports that she is in general feeling worse today than on admission.  Pt denies vaginal bleeding.  No nausea or emesis.  c/o generalized pain in same areas as her usual crisis.  Pt also c/o pain in her back that is worse since on admission.  She feels like she might be 'catching a cold' She c/o a nose bleed after admission and feel that the air is dry and her throat hurts Objective: I have reviewed patient's vital signs, medications, labs and radiology results.  General: alert, cooperative and mild distress GI: soft, non-tender; bowel sounds normal; no masses,  no organomegaly mild CVAT on both sides L>R NO s/sx of DVT  CBC    Component Value Date/Time   WBC 19.0* 04/02/2012 0525   RBC 2.27* 04/02/2012 0525   HGB 7.0* 04/02/2012 0525   HCT 21.2* 04/02/2012 0525   PLT 389 04/02/2012 0525   MCV 93.4 04/02/2012 0525   MCH 30.8 04/02/2012 0525   MCHC 33.0 04/02/2012 0525   RDW 18.5* 04/02/2012 0525   LYMPHSABS 3.6 04/02/2012 0525   MONOABS 2.1* 04/02/2012 0525   EOSABS 1.0* 04/02/2012 0525   BASOSABS 0.2* 04/02/2012 0525      Assessment/Plan: 21yo [id number] in her 6th week of pregnancy now with Sickle cell crisis.  Pt now with back pain need to r/o UTI.  Suspect sx related to hospital bed.       Humidified O2 Sono in 1 week to confirm fetal viability UA to r/o UTI  Will add egg crate to bed for cushion  LOS: 5 days    [name], Nadine Ryle 04/02/2012, 9:39 AM

## 2012-04-03 DIAGNOSIS — Z331 Pregnant state, incidental: Secondary | ICD-10-CM

## 2012-04-03 LAB — CBC WITH DIFFERENTIAL/PLATELET
Basophils Absolute: 0.1 10*3/uL (ref 0.0–0.1)
Eosinophils Absolute: 0.9 10*3/uL — ABNORMAL HIGH (ref 0.0–0.7)
Eosinophils Relative: 5 % (ref 0–5)
HCT: 24.8 % — ABNORMAL LOW (ref 36.0–46.0)
Lymphs Abs: 3 10*3/uL (ref 0.7–4.0)
MCH: 30.8 pg (ref 26.0–34.0)
MCV: 90.8 fL (ref 78.0–100.0)
Monocytes Absolute: 1.6 10*3/uL — ABNORMAL HIGH (ref 0.1–1.0)
Platelets: 421 10*3/uL — ABNORMAL HIGH (ref 150–400)
RDW: 19.1 % — ABNORMAL HIGH (ref 11.5–15.5)

## 2012-04-03 LAB — COMPREHENSIVE METABOLIC PANEL
ALT: 23 U/L (ref 0–35)
Calcium: 9 mg/dL (ref 8.4–10.5)
Creatinine, Ser: 0.65 mg/dL (ref 0.50–1.10)
GFR calc Af Amer: >90 mL/min (ref >90)
GFR calc non Af Amer: >90 mL/min (ref >90)
Glucose, Bld: 107 mg/dL — ABNORMAL HIGH (ref 70–99)
Sodium: 136 mEq/L (ref 135–145)
Total Protein: 6.1 g/dL (ref 6.0–8.3)

## 2012-04-03 LAB — PREPARE RBC (CROSSMATCH)

## 2012-04-03 MED ORDER — HYDROMORPHONE HCL PF 1 MG/ML IJ SOLN
1.0000 mg | INTRAMUSCULAR | Status: DC | PRN
  Administered 2012-04-03 – 2012-04-04 (×3): 1 mg via INTRAVENOUS
  Filled 2012-04-03 (×4): qty 1

## 2012-04-03 MED ORDER — FOLIC ACID 1 MG PO TABS
2.0000 mg | ORAL_TABLET | Freq: Every day | ORAL | Status: DC
  Administered 2012-04-03 – 2012-04-04 (×2): 2 mg via ORAL
  Filled 2012-04-03 (×3): qty 2

## 2012-04-03 NOTE — Progress Notes (Signed)
Subjective: Patient reports: pt with no complaints this am.  Refused to wake up to talk.  She did say that she felt better than yesterday and was not in pain.  Her pain meds were adjusted yesterday.  A dilaudid PCA was initiated and she has remained on O2.   She c/o vaginal spotting yesterday that the nurse did not witness.  The pad that was placed on pt remained dry but, pt put her finger in her vagina and was able to show the nurse a spot of blood.    Objective: I have reviewed patient's vital signs, intake and output and medications.  PE:  Pt lying in bed with no clothes on.  Arousable but, sleepy.  Assessment/Plan: Early pregnancy Sickle cell crises- pain improved on PCA Sono to confirm fetal viability in 1 week   LOS: 6 days    [name], [name] 04/03/2012, 7:28 AM

## 2012-04-03 NOTE — Progress Notes (Signed)
04/03/12 1500  Clinical Encounter Type  Visited With Patient  Visit Type Initial  Referral From Nurse Tinnie Gens, RN)    Pt on phone during initial visit; we planned for me to follow up tomorrow.  Thank you to Tinnie Gens, RN for referral.  Caro Hight, [place] [phone]

## 2012-04-03 NOTE — Consult Note (Signed)
PROGRESS NOTE  [name] YNW:[id number] DOB: 12-31-1990 DOA: 03/28/2012 PCP: August Saucer ERIC, MD  Assessment/Plan: Principal Problem:  Sickle cell pain crisis:Pt in active sickle cell pain crisis.  Pt still rates her pain at 6/10. Will continue PCA  And assess use of breakthrough doses. Continue IV hydration, Nasal oxygen and incentive spirometry.     Sickle cell hemolytic anemia: Hb 8.4 after transfusion. Still  down from baseline of 9.8. May be partially due to hemodilution but likely hemolytic. Continue to monitor.    Leukocytosis: WBC down to 15.9 from 19 today. Pt would benefit from short course of Toradol. However has had bleeding whic is a relative contraindication. Will defer to Ob-Gyn as to safety of use in pt's current obstetrical state  Pt was offered transfer to [facility] for remainder of hospital stay. She refused citing psychosocial issues and possible misunderstanding of  interaction with staff. She feels that transferring will be too stressful for her at this time. For now patient will remain at [facility] and we will be happy to follow.  Code Status: Full Family Communication: Not applicable  Disposition Plan: Home at discharge  MATTHEWS,MICHELLE A.  [facility] Pager [phone]. If 7PM-7AM, please contact night-coverage. 04/03/2012, 6:49 PM  LOS: 6 days   Brief narrative: Pt presently having a vaso-occlusive episode associated with her Hb SS sickle cell disease which according to patient began a few hours before presentation to the ED. She is presently approximately [redacted] weeks pregnant (Pt has had a previous spontaneous abortion in the past) and is admitted to [facility] for small sud-chorionic hemorrhage which is now resolved.  Pt however complains of pain which is located in the left upper back and shoulder. Her baseline pain is usually 4-5/10 however pain is presently at 6/10. She states that current therapy decreases pain to approximately 6/10 and relief lasts  only 11/2 hours. She is presently taking her Folic Acid but Hydrea has been discontinued secondary to pregnancy. Hgb electrophoresis from 03/08/2012 shows Hb SS 73%, Hb F 23.9%    Procedures:  None  Antibiotics:  None  HPI/Subjective: Pt   localizes her pain to the low back. Her baseline pain is usually 4-5/10 however pain is presently at 6/10.   Objective: Filed Vitals:   04/03/12 1000 04/03/12 1400 04/03/12 1539 04/03/12 1800  BP: 103/50 125/61  103/73  Pulse: 86 100  96  Temp: 98.5 F (36.9 C) 98 F (36.7 C)  98 F (36.7 C)  TempSrc: Oral Oral  Oral  Resp: 18 20 12 12   Height:      Weight:      SpO2: 100% 100%  100%   Weight change: 0 kg (0 lb)  Intake/Output Summary (Last 24 hours) at 04/03/12 1849 Last data filed at 04/03/12 1708  Gross per 24 hour  Intake 4500.07 ml  Output   4450 ml  Net  50.07 ml    General: Alert, awake, oriented x3, in no acute distress. Pt is stressed as she is concerned about a spontaneous abortion. OROPHARYNX:  Moist, No exudate/ erythema/lesions.  Heart: Regular rate and rhythm, without murmurs, rubs, gallops.  Lungs: Clear to auscultation, no wheezing or rhonchi noted.  Abdomen: Soft, nontender, nondistended, positive bowel sounds,no hepatosplenomegaly noted.. . Musculoskeletal: No warm swelling or erythema around joints, no spinal tenderness noted. Psychiatric: Patient alert and oriented x3, good insight and cognition, good recent to remote recall. Tearful about risk of losing baby.    Data Reviewed: Basic Metabolic Panel:  Lab 04/03/12 0655 04/02/12 0525 03/30/12 0535 03/28/12 1807  NA 136 137 135 135  K 4.1 4.2 3.9 3.7  CL 101 101 102 101  CO2 28 28 25 24   GLUCOSE 107* 101* 95 85  BUN 6 7 7  5*  CREATININE 0.65 0.68 0.57 0.56  CALCIUM 9.0 8.8 8.8 9.6  MG -- -- -- --  PHOS -- -- -- --   Liver Function Tests:  Lab 04/03/12 0655 04/02/12 0525 03/30/12 0535  AST 21 24 16   ALT 23 24 6   ALKPHOS 55 66 66  BILITOT 1.6*  2.1* 2.1*  PROT 6.1 5.9* 6.6  ALBUMIN 3.2* 3.1* 3.4*   No results found for this basename: LIPASE:5,AMYLASE:5 in the last 168 hours No results found for this basename: AMMONIA:5 in the last 168 hours CBC:  Lab 04/03/12 0655 04/02/12 0525 03/30/12 0535 03/28/12 1807  WBC 15.9* 19.0* 15.5* 16.1*  NEUTROABS 10.4* 12.1* -- 10.3*  HGB 8.4* 7.0* 8.6* 9.8*  HCT 24.8* 21.2* 25.2* 28.3*  MCV 90.8 93.4 90.0 89.0  PLT 421* 389 530* 589*   Cardiac Enzymes: No results found for this basename: CKTOTAL:5,CKMB:5,CKMBINDEX:5,TROPONINI:5 in the last 168 hours BNP (last 3 results)  Basename 11/21/11 1220 10/26/11 0720 10/25/11 0337  PROBNP 16.2 29.9 41.7   CBG: No results found for this basename: GLUCAP:5 in the last 168 hours  No results found for this or any previous visit (from the past 240 hour(s)).   Studies: US Abdomen Complete  03/14/2012  *RADIOLOGY REPORT*  Clinical Data:  Sickle cell crisis.  Bilateral flank pain.  COMPLETE ABDOMINAL ULTRASOUND  Comparison:  CT abdomen 06/21/2011  Findings:  Gallbladder:  Surgically absent  Common bile duct:  Normal at 5 mm.  Liver:  No focal lesion identified.  Within normal limits in parenchymal echogenicity.  IVC:  Appears normal.  Pancreas:  No focal abnormality seen.  Spleen:  Small and  lobular  Right Kidney:  11.1cm in length.  No evidence of hydronephrosis or stones.  Left Kidney:  11.5cm in length.  No evidence of hydronephrosis or stones.  Abdominal aorta:  No aneurysm identified.  IMPRESSION:  1.  No acute abdominal findings.  2.  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 3.  No hydronephrosis.   Original Report Authenticated By: Genevive Bi, M.D.    US Ob Comp Less 14 Wks  03/28/2012  *RADIOLOGY REPORT*  Clinical Data: check for intrauterine preg,pain; ; PELVIC PAIN.  OBSTETRIC <14 WK [place] AND TRANSVAGINAL OB US  Technique: Both transabdominal and transvaginal ultrasound examinations were performed for complete evaluation of the  gestation as well as the maternal uterus, adnexal regions, and pelvic cul-de-sac.  Comparison: None.  Findings: Single intrauterine gestation.  Mean sac diameter is 7.9 mm for estimated gestational age of [redacted] weeks 4 days.  No fetal pole currently.  Yolk sac is visualized.  Small subchorionic hemorrhage present.  2.9 cm simple appearing cyst in the right ovary.  Left corpus luteal cyst.  No free fluid.  IMPRESSION: 5-week-4-day intrauterine pregnancy.  No fetal pole currently. Small subchorionic hemorrhage.   Original Report Authenticated By: Charlett Nose, M.D.    US Ob Transvaginal  03/28/2012  *RADIOLOGY REPORT*  Clinical Data: check for intrauterine preg,pain; ; PELVIC PAIN.  OBSTETRIC <14 WK [place] AND TRANSVAGINAL OB US  Technique: Both transabdominal and transvaginal ultrasound examinations were performed for complete evaluation of the gestation as well as the maternal uterus, adnexal regions, and pelvic cul-de-sac.  Comparison: None.  Findings:  Single intrauterine gestation.  Mean sac diameter is 7.9 mm for estimated gestational age of [redacted] weeks 4 days.  No fetal pole currently.  Yolk sac is visualized.  Small subchorionic hemorrhage present.  2.9 cm simple appearing cyst in the right ovary.  Left corpus luteal cyst.  No free fluid.  IMPRESSION: 5-week-4-day intrauterine pregnancy.  No fetal pole currently. Small subchorionic hemorrhage.   Original Report Authenticated By: Charlett Nose, M.D.    Dg Chest Port 1 View  03/08/2012  *RADIOLOGY REPORT*  Clinical Data: PICC line placement.  PORTABLE CHEST - 1 VIEW  Comparison: Single view chest 03/08/2012 at 08:08 p.m.  Findings: The PICC line has been repositioned.  The tip is now in the proximal SVC, at the level of the azygos vein.  The heart size is normal.  Lungs are clear.  IMPRESSION:  1.  Repositioning of the left-sided PICC line with the tip in the proximal SVC.   Original Report Authenticated By: Jamesetta Orleans. MATTERN, M.D.    Dg Chest Port 1  View  03/08/2012  *RADIOLOGY REPORT*  Clinical Data: Confirm PICC line placement  PORTABLE CHEST - 1 VIEW  Comparison: 01/08/2012  Findings:  Grossly unchanged cardiac silhouette and mediastinal contours.  No focal parenchymal opacities.  There is persistent mild elevation of the right hemidiaphragm.  No definite pleural effusion or pneumothorax.  Interval placement of left upper extremity approach PICC line with tip coursing up the expected location of the left internal jugular vein, tip excluded from view.  IMPRESSION:  Malpositioned left upper extremity approach PICC line with tip coursing up the expected location of the left internal jugular vein, tip excluded from view.  Repositioning is advised.  This was made a call report.   Original Report Authenticated By: Waynard Reeds, M.D.     Scheduled Meds:    . famotidine  20 mg Oral BID  . folic acid  2 mg Oral Daily  . HYDROmorphone PCA 0.3 mg/mL   Intravenous Q4H  . prenatal multivitamin  1 tablet Oral Daily  . [DISCONTINUED] folic acid  1 mg Oral Daily   Continuous Infusions:    . sodium chloride 100 mL/hr at 04/03/12 0459    Principal Problem:  *Sickle cell pain crisis Active Problems:  SICKLE CELL ANEMIA  Leukocytosis  Sickle cell hemolytic anemia

## 2012-04-03 NOTE — Progress Notes (Signed)
Pt states  She has started bleeding again.Scanty amt of vaginal bleeding noted on V-pad.Bleeding is dark brown in color. DR. Dove notified of pts status. Orders were received and carried.  Pt  Repositioned for comfort.

## 2012-04-04 ENCOUNTER — Inpatient Hospital Stay (HOSPITAL_COMMUNITY): Payer: Medicaid Other

## 2012-04-04 DIAGNOSIS — F329 Major depressive disorder, single episode, unspecified: Secondary | ICD-10-CM

## 2012-04-04 DIAGNOSIS — F17200 Nicotine dependence, unspecified, uncomplicated: Secondary | ICD-10-CM

## 2012-04-04 LAB — COMPREHENSIVE METABOLIC PANEL
Albumin: 3.2 g/dL — ABNORMAL LOW (ref 3.5–5.2)
Alkaline Phosphatase: 61 U/L (ref 39–117)
BUN: 7 mg/dL (ref 6–23)
CO2: 27 mEq/L (ref 19–32)
Chloride: 100 mEq/L (ref 96–112)
Creatinine, Ser: 0.67 mg/dL (ref 0.50–1.10)
GFR calc non Af Amer: >90 mL/min (ref >90)
Potassium: 3.8 mEq/L (ref 3.5–5.1)
Total Bilirubin: 1.5 mg/dL — ABNORMAL HIGH (ref 0.3–1.2)

## 2012-04-04 LAB — CBC WITH DIFFERENTIAL/PLATELET
Basophils Relative: 0 % (ref 0–1)
HCT: 25.8 % — ABNORMAL LOW (ref 36.0–46.0)
Hemoglobin: 8.6 g/dL — ABNORMAL LOW (ref 12.0–15.0)
Lymphocytes Relative: 20 % (ref 12–46)
Lymphs Abs: 3 10*3/uL (ref 0.7–4.0)
Monocytes Absolute: 1.7 10*3/uL — ABNORMAL HIGH (ref 0.1–1.0)
Monocytes Relative: 11 % (ref 3–12)
Neutro Abs: 9.7 10*3/uL — ABNORMAL HIGH (ref 1.7–7.7)
Neutrophils Relative %: 64 % (ref 43–77)
RBC: 2.86 MIL/uL — ABNORMAL LOW (ref 3.87–5.11)
WBC: 15.1 10*3/uL — ABNORMAL HIGH (ref 4.0–10.5)

## 2012-04-04 NOTE — Progress Notes (Signed)
Patient requested Diludad 1mg  IV and Zofran 4mg  IV  3x within last hour and a half. Each time pain medication was taken into the room patient was found asleep snoring. Pain medication was returned each time.

## 2012-04-04 NOTE — Progress Notes (Signed)
Ambulated out with significant other

## 2012-04-04 NOTE — Progress Notes (Signed)
04/04/12 1500  Clinical Encounter Type  Visited With Patient and family together (Boyfriend [name])  Visit Type Follow-up;Spiritual support;Social support  Spiritual Encounters  Spiritual Needs Emotional (community resource referrals for support)  Stress Factors  Patient Stress Factors Loss of control (Anxiety about possible miscarriage; trying to be hopeful)    After several mis-timed attempts, I finally was able to meet with [name] and her boyfriend [name] before discharge.  They were very welcoming of chaplain support and open about their fears and hopes.  We had a lovely, powerful visit, in which they felt very supported.  [name] asked for the [facility] office number, which she plans to call for further support as life unfolds.  Both she and [name] expressed interest in coming back in to process their story and feelings further.  I provided pastoral listening, reflection, and encouragement, as well as referral information for [facility] small group and [facility] programs.  [name] also looked up the [facility] website on her phone so that she could explore it more at home.  Family is aware of ongoing chaplain availability during any stay at the hospital.  [address] [place], [place] [phone]

## 2012-04-04 NOTE — Progress Notes (Signed)
Patient ID: [name], female   DOB: 03-05-1991, 21 y.o.   MRN: [id number]  [facility] PROGRESS NOTE  TYISHIA AUNE WUJ:[id number] DOB: [birth year] DOA: 03/28/2012 PCP: August Saucer ERIC, MD  Brief narrative: Pt is 21 yo female with Hb SS sickle cell disease which according to patient began a few hours before presentation to the ED. She is presently approximately [redacted] weeks pregnant (Pt has had a previous spontaneous abortion in the past) and is admitted to [facility] for small sud-chorionic hemorrhage which is now resolved. Pt however complained of pain which is located in the left upper back and shoulder. Her baseline pain is usually 4-5/10 however pain is presently at 6/10. She states that current therapy decreases pain to approximately 4/10 and relief lasts only 11/2 hours. She is presently taking her Folic Acid but Hydrea has been discontinued secondary to pregnancy. Hgb electrophoresis from 03/08/2012 shows Hb SS 73%, Hb F 23.9%   Principal Problem:  Sickle cell pain crisis: This is now resolved and pt reports baseline functional status this AM She is ready to go home but is awaiting to hear from OB on the results of TVUS Stable for discharge from our standpoint and follow up with Dr. 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 Still down from baseline of 9.8.  Pt denies further vaginal bleeding episodes. Hg remains stable this AM compared to yesterday. Leukocytosis:  WBC down to 15.1 from 19 on admission. Vaginal bleeding Per TVUS, pt with new miscarriage  Hg remains stable Chaplain support offered  Procedures/Studies: US Ob Transvaginal 04/04/2012   This exam was performed within a [facility]. The OB US report was generated in the [facility] system, and faxed to the ordering physician.   This report is also available in [employer] and in the [employer]. See [facility] Obstetric US report.    Antibiotics:  None  Code Status: Full Family Communication: Pt at bedside Disposition Plan: Home today  HPI/Subjective: No events overnight.   Objective: Filed Vitals:   04/04/12 0528 04/04/12 0530 04/04/12 0718 04/04/12 1053  BP:      Pulse:      Temp:      TempSrc:      Resp: 14  12 17   Height:      Weight:  91.199 kg (201 lb 0.9 oz)    SpO2: 100%  93%     Intake/Output Summary (Last 24 hours) at 04/04/12 1336 Last data filed at 04/04/12 0952  Gross per 24 hour  Intake 3811.97 ml  Output   3650 ml  Net 161.97 ml    Exam:   General:  Pt is alert, follows commands appropriately, not in acute distress  Cardiovascular: Regular rate and rhythm, S1/S2, no murmurs, no rubs, no gallops  Respiratory: Clear to auscultation bilaterally, no wheezing, no crackles, no rhonchi  Abdomen: Soft, non tender, non distended, bowel sounds present, no guarding  Extremities: No edema, pulses DP and PT palpable bilaterally  Neuro: Grossly nonfocal  Data Reviewed: Basic Metabolic Panel:  Lab 04/04/12 9562 04/03/12 0655 04/02/12 0525 03/30/12 0535 03/28/12 1807  NA 135 136 137 135 135  K 3.8 4.1 4.2 3.9 3.7  CL 100 101 101 102 101  CO2 27 28 28 25 24   GLUCOSE 104* 107* 101* 95 85  BUN 7 6 7 7  5*  CREATININE 0.67 0.65 0.68 0.57 0.56  CALCIUM 9.1 9.0 8.8 8.8 9.6  MG -- -- -- -- --  PHOS -- -- -- -- --   Liver Function Tests:  Lab 04/04/12 0510 04/03/12 0655 04/02/12 0525 03/30/12 0535  AST 23 21 24 16   ALT 21 23 24 6   ALKPHOS 61 55 66 66  BILITOT 1.5* 1.6* 2.1* 2.1*  PROT 6.5 6.1 5.9* 6.6  ALBUMIN 3.2* 3.2* 3.1* 3.4*   CBC:  Lab 04/04/12 0510 04/03/12 0655 04/02/12 0525 03/30/12 0535 03/28/12 1807  WBC 15.1* 15.9* 19.0* 15.5* 16.1*  NEUTROABS 9.7* 10.4* 12.1* -- 10.3*  HGB 8.6* 8.4* 7.0* 8.6* 9.8*  HCT 25.8* 24.8* 21.2* 25.2* 28.3*  MCV 90.2 90.8 93.4 90.0 89.0  PLT 393 421* 389 530* 589*   Scheduled Meds:   . famotidine  20 mg Oral BID  . folic acid  2  mg Oral Daily  . HYDROmorphone PCA 0.3 mg/mL   Intravenous Q4H  . prenatal multivitamin  1 tablet Oral Daily   Continuous Infusions:   . sodium chloride 100 mL/hr at 04/04/12 0910     Debbora Presto, MD  [facility] Pager [phone]  If 7PM-7AM, please contact night-coverage [email or web address] Password [email or web address] 04/04/2012, 1:36 PM   LOS: 7 days

## 2012-04-04 NOTE — ED Provider Notes (Signed)
Medical screening examination/treatment/procedure(s) were performed by non-physician practitioner and as supervising physician I was immediately available for consultation/collaboration.  Sunnie Nielsen, MD 04/04/12 [phone]

## 2012-04-04 NOTE — Discharge Summary (Signed)
Physician Discharge Summary  Patient ID: [name] MRN: [id number] DOB/AGE: 1991/05/14 21 y.o.  Admit date: 03/28/2012 Discharge date: 04/04/2012  Admission Diagnoses:IUP 5.3 weeks, Sickle cell crisis  Discharge Diagnoses:  Principal Problem:  *Sickle cell pain crisis Active Problems:  SICKLE CELL ANEMIA  Leukocytosis  Sickle cell hemolytic anemia Inevitable abortion Discharged Condition: good  Hospital Course: Improved symptoms. US showed inevitable abortion in progress  Consults: [facility]  Significant Diagnostic Studies: labs: CBC, US pelvic  Treatments: IV hydration and analgesia: Dilaudid  Discharge Exam: Blood pressure 99/48, pulse 98, temperature 97.8 F (36.6 C), temperature source Oral, resp. rate 17, height 5\' 3"  (1.6 m), weight 201 lb 0.9 oz (91.199 kg), last menstrual period 01/29/2012, SpO2 93.00%. See Dr. Ellin Saba PE this AM  Patient ID: [name], female DOB: 21-Sep-1990, 21 y.o. MRN: [id number]  HD#7  S. She reports her sickle cell pain to be unchanged, requesting more pain meds from nurse. She says that her vaginal bleeding has decreased since last night. She denies uterine cramping.  O. VSS, AF  PE heart- rrr  Lungs- CTAB  Her pad showed some old brown discharge and a quarter size area of red blood  QBHCG 2470 last pm  Blood type B+  A/P. Sickle cell crisis- pain management  6+ weeks EGA with h/o recurrent ab, now with bleeding.        Disposition: 01-Home or Self Care     Medication List     As of 04/04/2012 11:42 AM    ASK your doctor about these medications         folic acid 1 MG tablet   Commonly known as: FOLVITE   Take 1 tablet (1 mg total) by mouth daily.      multivitamin-prenatal 27-0.8 MG Tabs   Take 1 tablet by mouth daily.      F/U with hematology Gyn clinic next week Miscarriage instructions given. Informed of [place] result. Requests expectant management, declines  Cytotec.   Signed: Danetta Prom 04/04/2012, 11:42 AM

## 2012-04-04 NOTE — Progress Notes (Signed)
Patient called and was concerned she was still bleeding. Patient voided 600cc dark red urine and passed a clot the size of a large grape. VS taken at this time, and all were WDL.  Nicholaus Bloom, Md paged with orders to get an order for the quantitative beta HCG now, and that if the patient passes anything other than a clot to send it to pathology. Will continue to monitor patient.

## 2012-04-04 NOTE — Progress Notes (Addendum)
Patient ID: [name], female   DOB: 29-Nov-1990, 21 y.o.   MRN: [id number]   HD#7  S. She reports her sickle cell pain to be unchanged, requesting more pain meds from nurse. She says that her vaginal bleeding has decreased since last night. She denies uterine cramping.  O. VSS, AF     PE heart- rrr           Lungs- CTAB          Her pad showed some old brown discharge and a quarter size area of red blood           QBHCG 2470 last pm           Blood type B+  A/P. Sickle cell crisis- pain management         6+ weeks EGA with h/o recurrent ab, now with bleeding. A gyn u/s will be done at 0800.

## 2012-04-06 LAB — TYPE AND SCREEN
ABO/RH(D): B POS
Antibody Screen: POSITIVE

## 2012-04-09 ENCOUNTER — Ambulatory Visit: Payer: Medicaid Other | Admitting: Obstetrics & Gynecology

## 2012-04-13 ENCOUNTER — Ambulatory Visit (HOSPITAL_COMMUNITY): Admission: RE | Admit: 2012-04-13 | Payer: Medicaid Other | Source: Ambulatory Visit | Admitting: Obstetrics

## 2012-04-13 ENCOUNTER — Encounter (HOSPITAL_COMMUNITY): Admission: RE | Payer: Self-pay | Source: Ambulatory Visit

## 2012-04-13 SURGERY — DILATION AND EVACUATION, UTERUS
Anesthesia: Choice

## 2012-04-23 IMAGING — CR DG CHEST 2V
2 series · 2 of 2 positions shown · non-contrast
Comparison: Chest radiograph performed 05/19/2011

CLINICAL DATA: Sickle cell crisis; chest pain and back pain.

CHEST - 2 VIEW

[w chest pa]
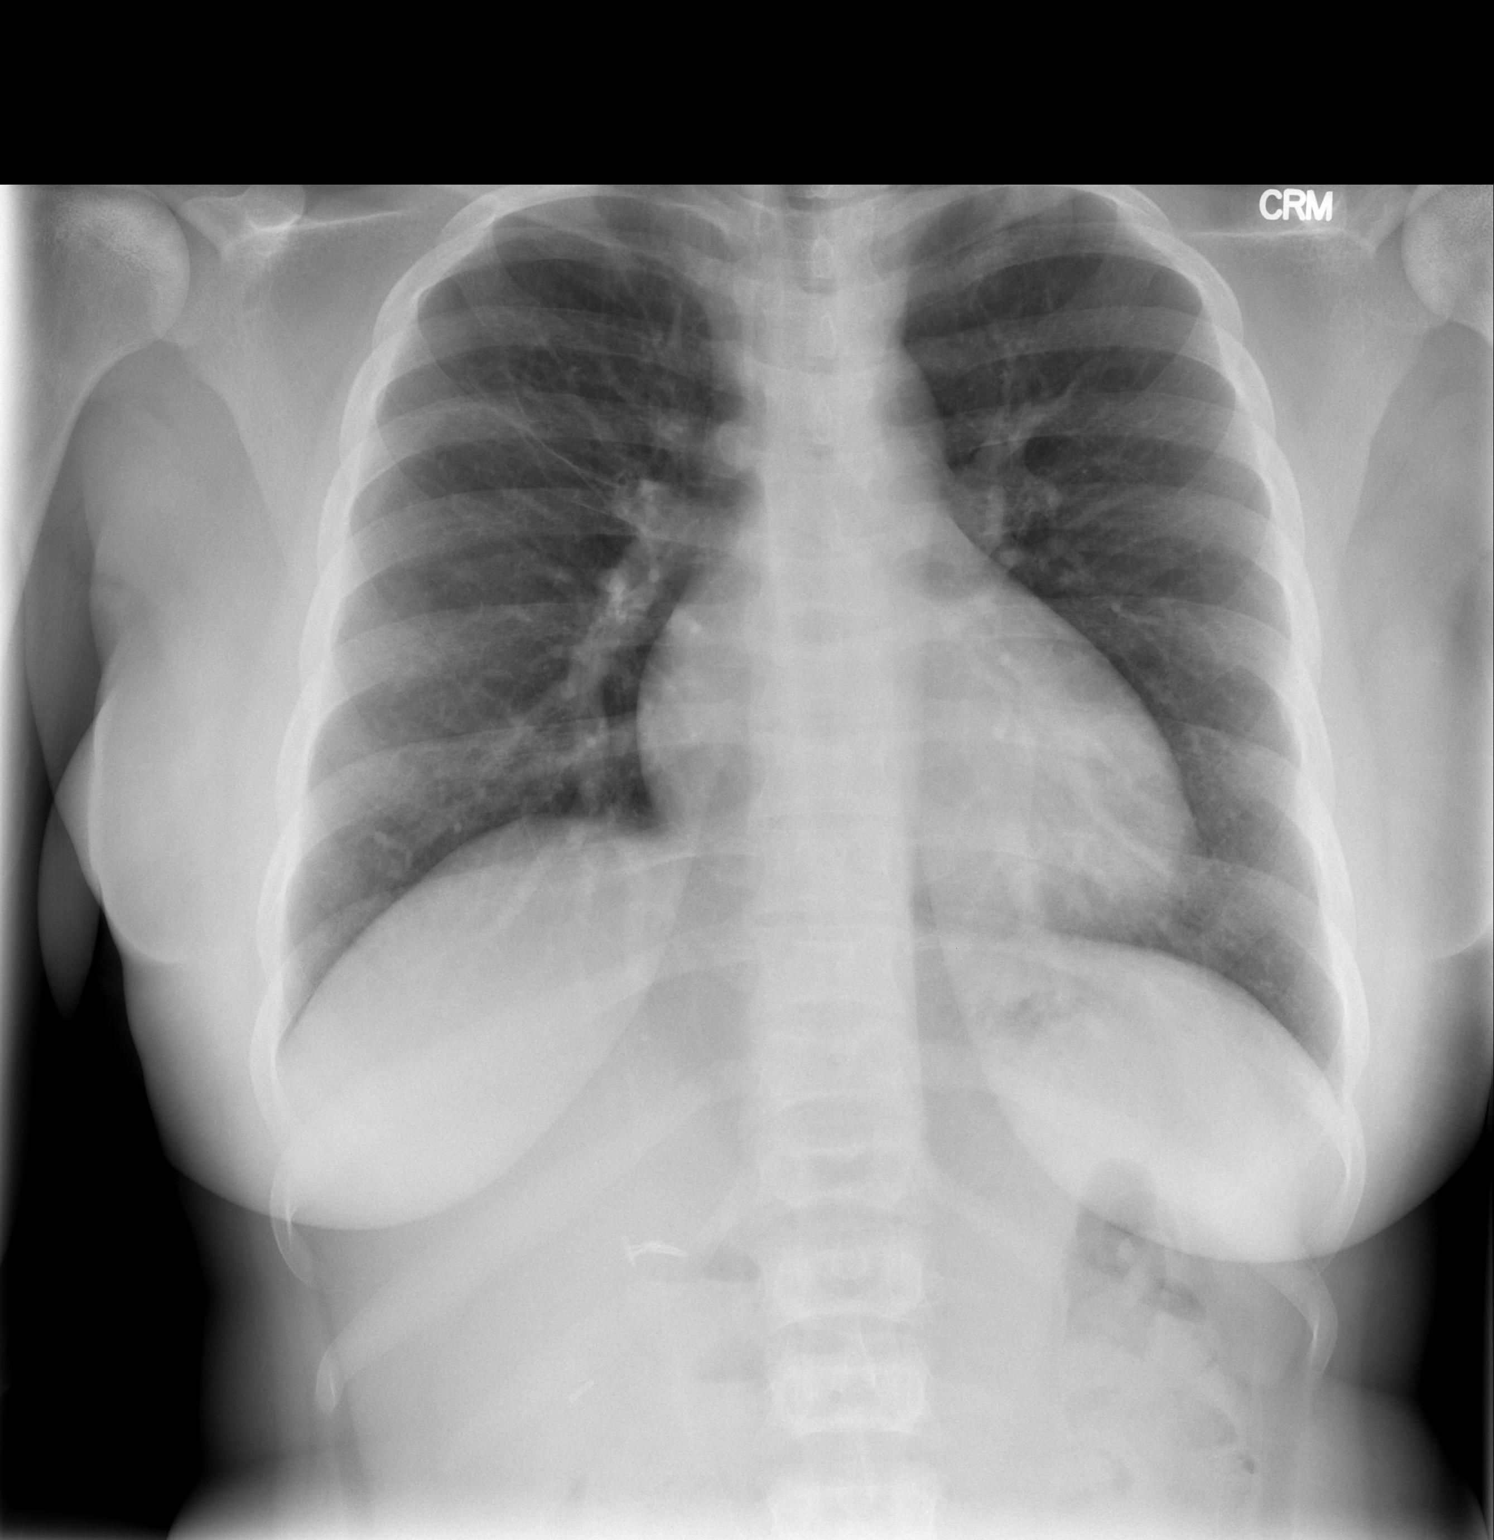

[w chest lat]
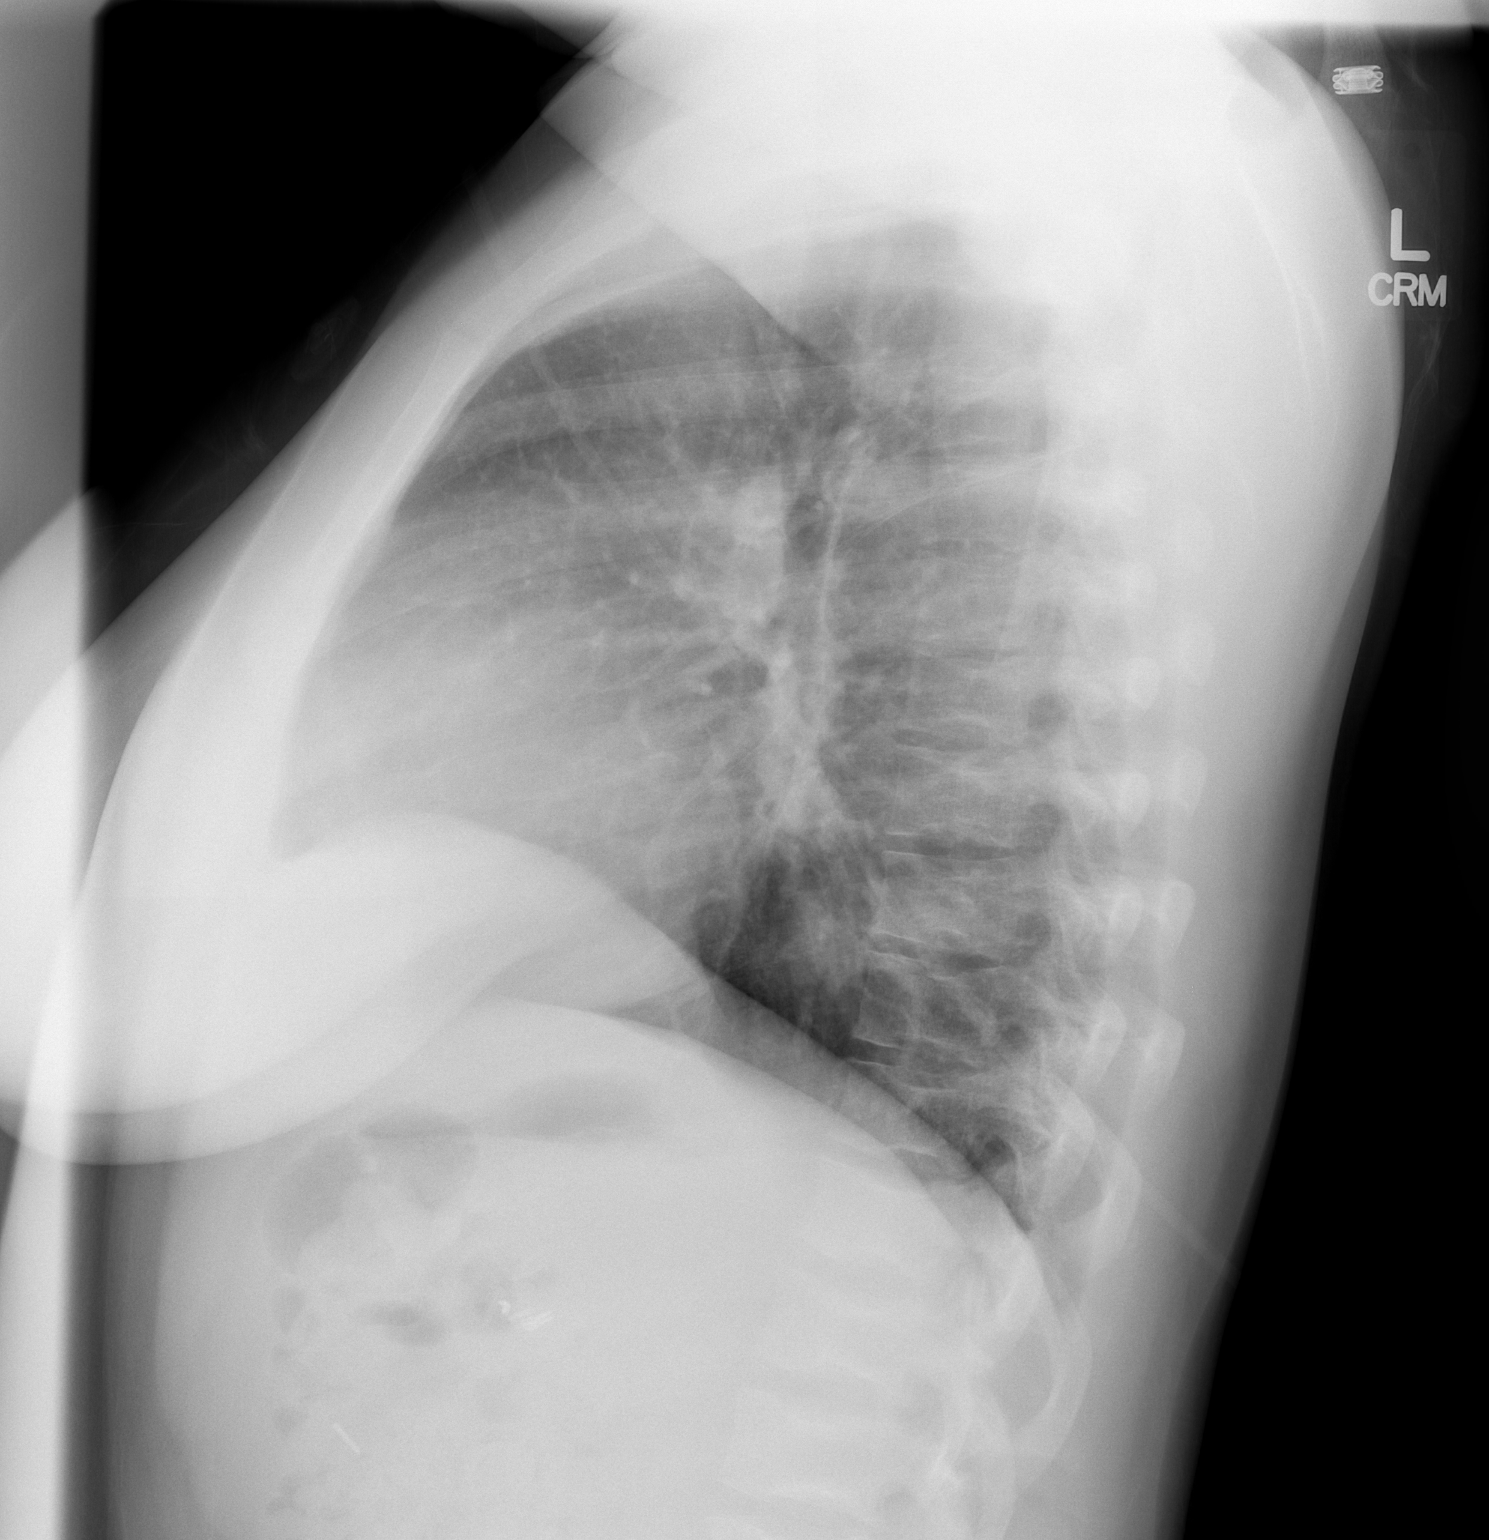

[2 of 2 positions shown; findings below may reference images not displayed]

FINDINGS: The lungs are well-aerated.  Minimal right midlung
scarring is unchanged in appearance.  There is no evidence of focal
opacification, pleural effusion or pneumothorax.

The heart is borderline normal in size; the mediastinal contour is
within normal limits.  No acute osseous abnormalities are seen;
vertebral body changes are again compatible with the patient's
history of sickle cell disease.  Clips are noted within the right
upper quadrant, reflecting prior cholecystectomy.
IMPRESSION: No acute cardiopulmonary process seen.

## 2012-05-04 IMAGING — CR DG ABDOMEN ACUTE W/ 1V CHEST
3 series · 3 of 3 positions shown · non-contrast
Comparison: 05/08/2012 and earlier studies

CLINICAL DATA: Abdominal pain

ACUTE ABDOMEN SERIES (ABDOMEN 2 VIEW & CHEST 1 VIEW)

[w chest pa]
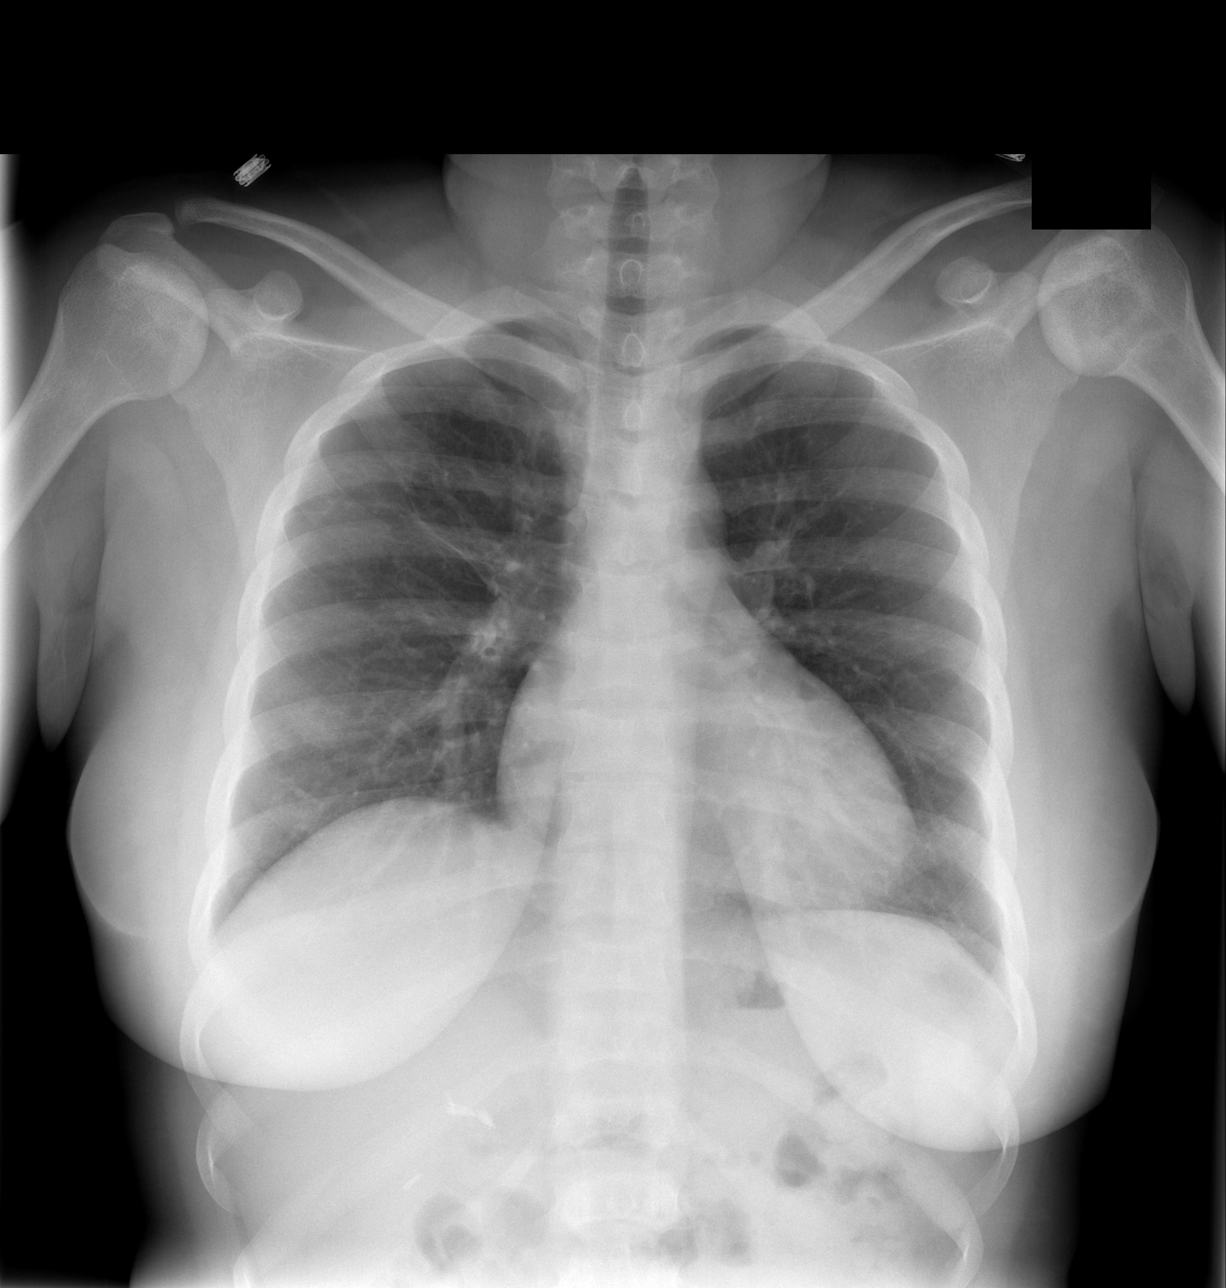

[w abdomen upright]
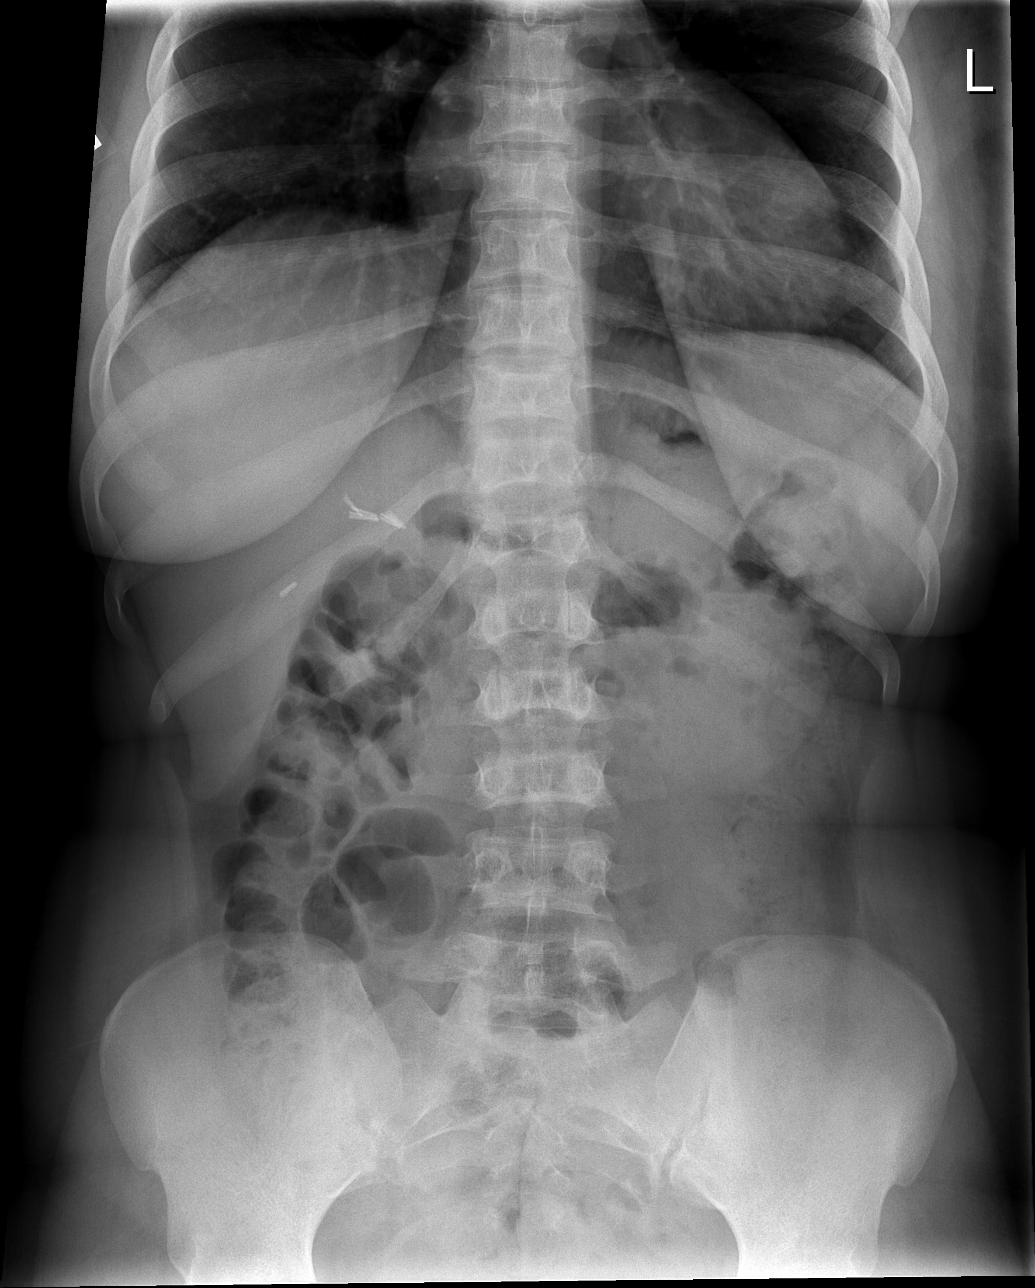

[t abdomen supine]
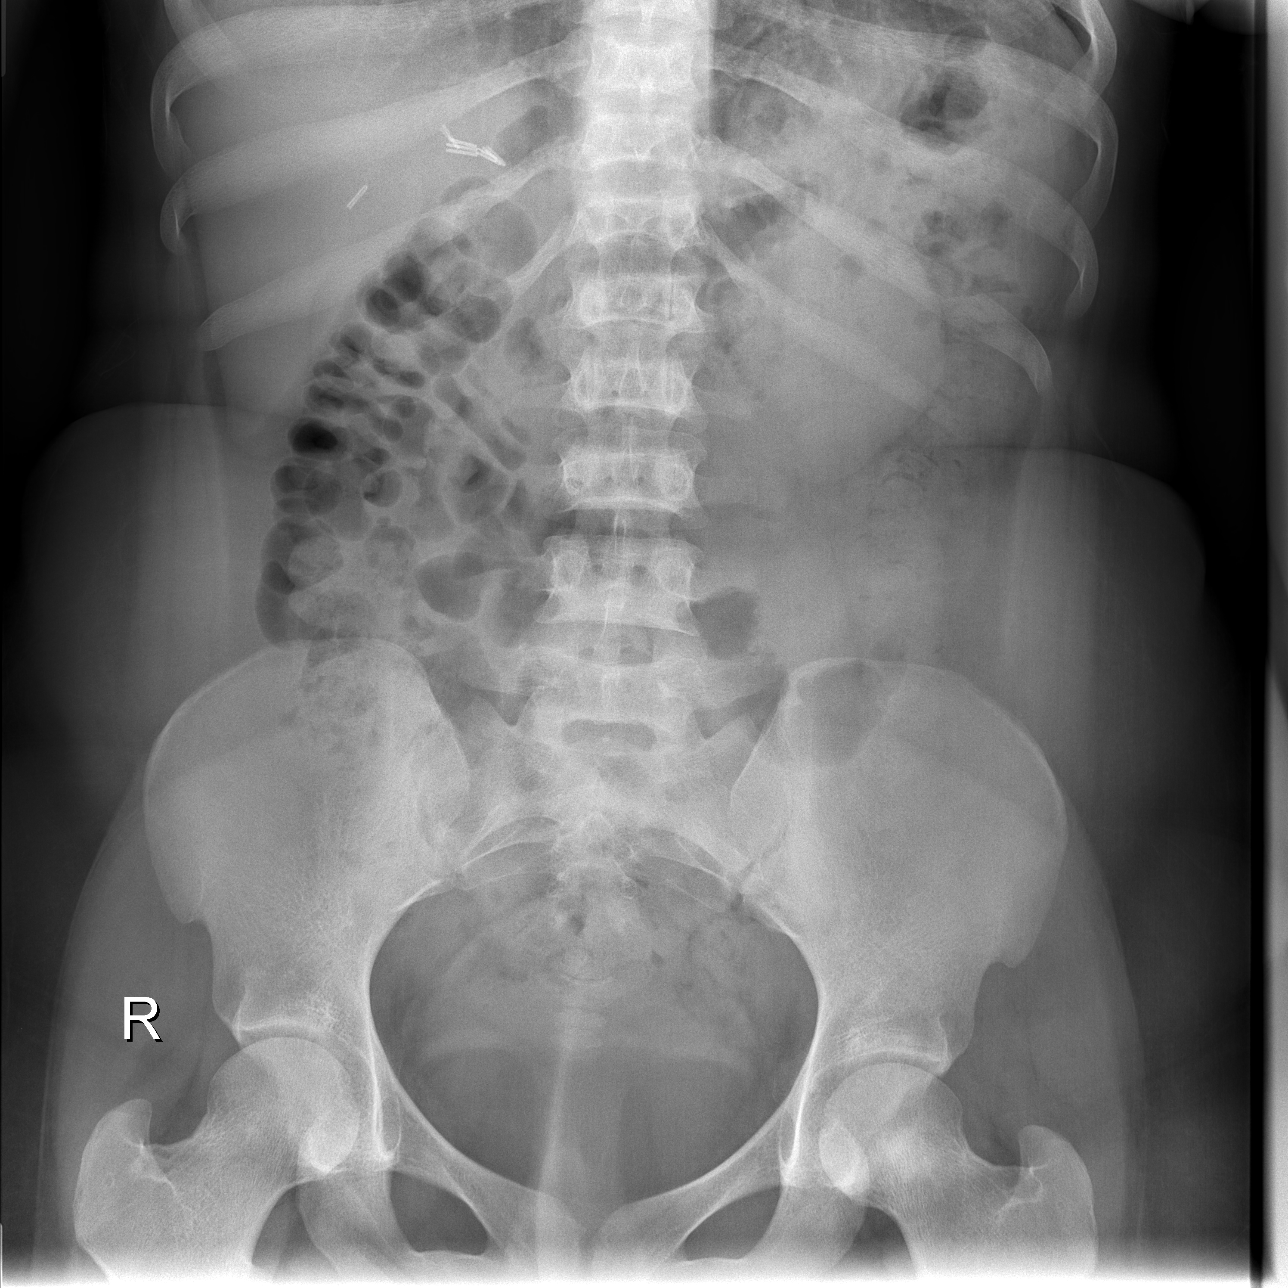

[3 of 3 positions shown; findings below may reference images not displayed]

FINDINGS: Stable linear scarring or subsegmental atelectasis in the
right upper lobe.  Lungs otherwise clear.  Heart size normal.  No
effusion.

No free air.  Vascular clips in the right upper abdomen.  A few gas
filled nondilated mid abdominal small bowel loops, with scattered
fluid levels on the erect radiograph.  Moderate fecal material in
the proximal colon, decompressed distally.  No abnormal abdominal
calcifications.  Regional bones unremarkable.
IMPRESSION: 1.  Fluid levels in dilated central small bowel which may represent
an early adynamic ileus or early/partial small bowel obstruction.
Consider follow-up films or CT if symptoms persist.
2.  No acute cardiopulmonary disease.
3.  No free air.

## 2012-05-05 IMAGING — CR DG KNEE COMPLETE 4+V*R*
4 series · 4 of 4 positions shown · non-contrast
Comparison: 03/15/2011

CLINICAL DATA: Pain post fall.

RIGHT KNEE - COMPLETE 4+ VIEW

[x knee ap right]
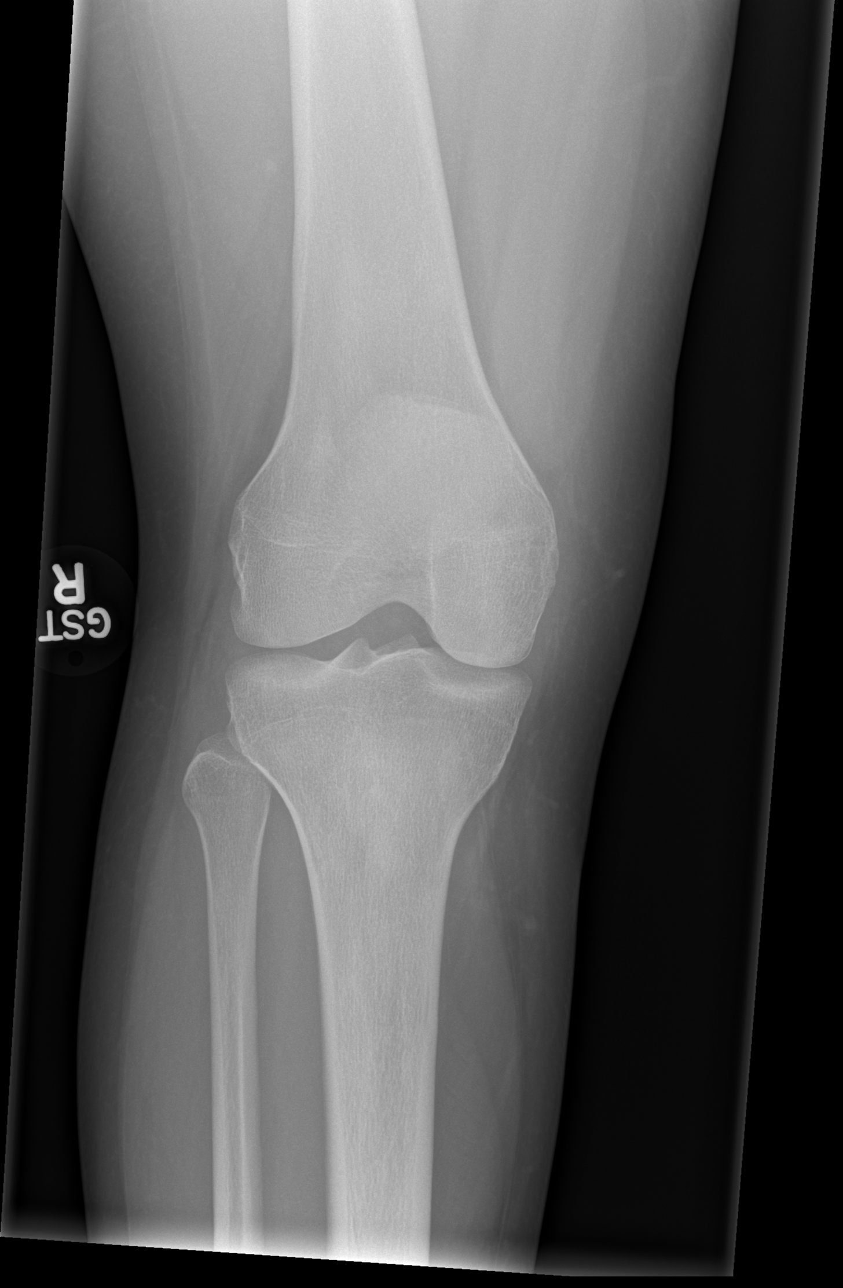

[x knee obl right (1 of 2)]
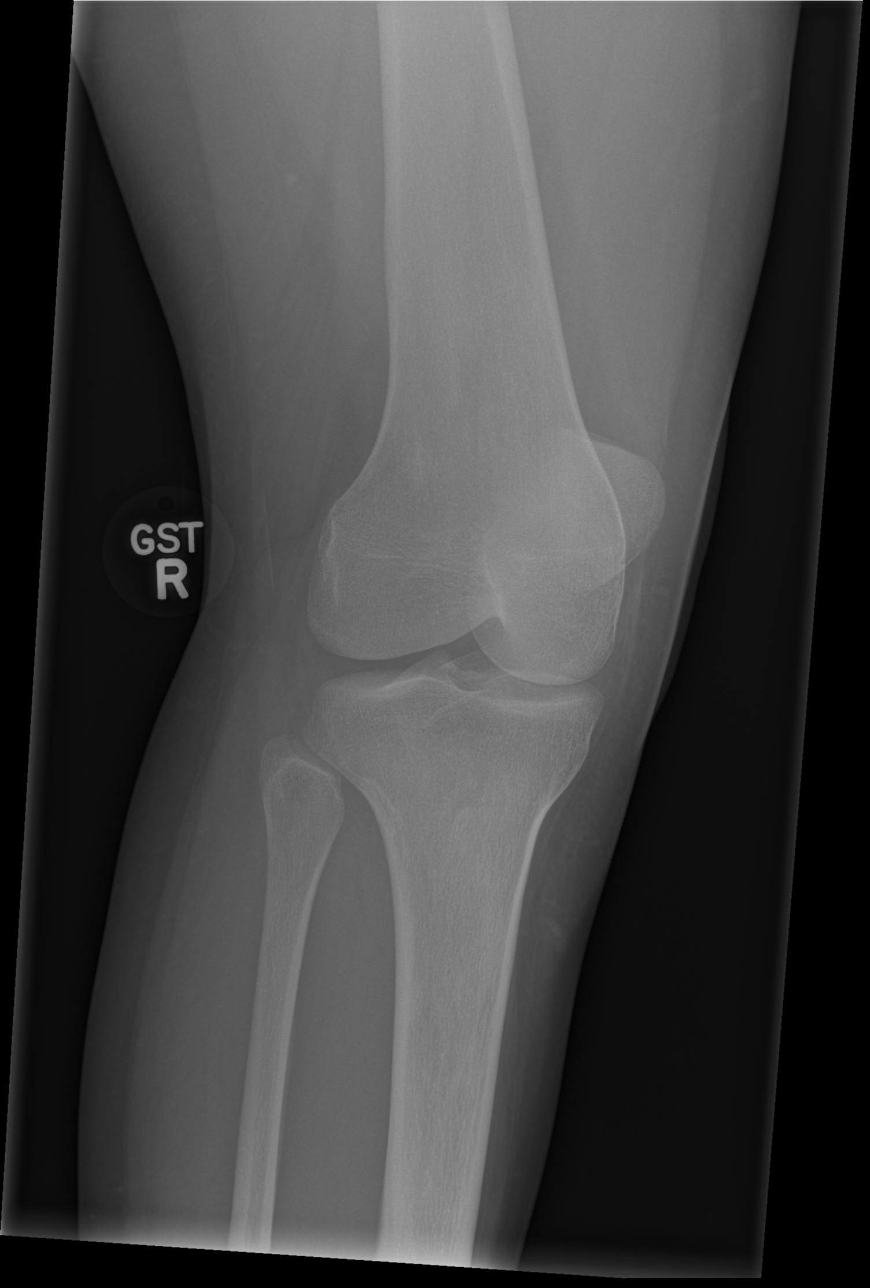

[x knee obl right (2 of 2)]
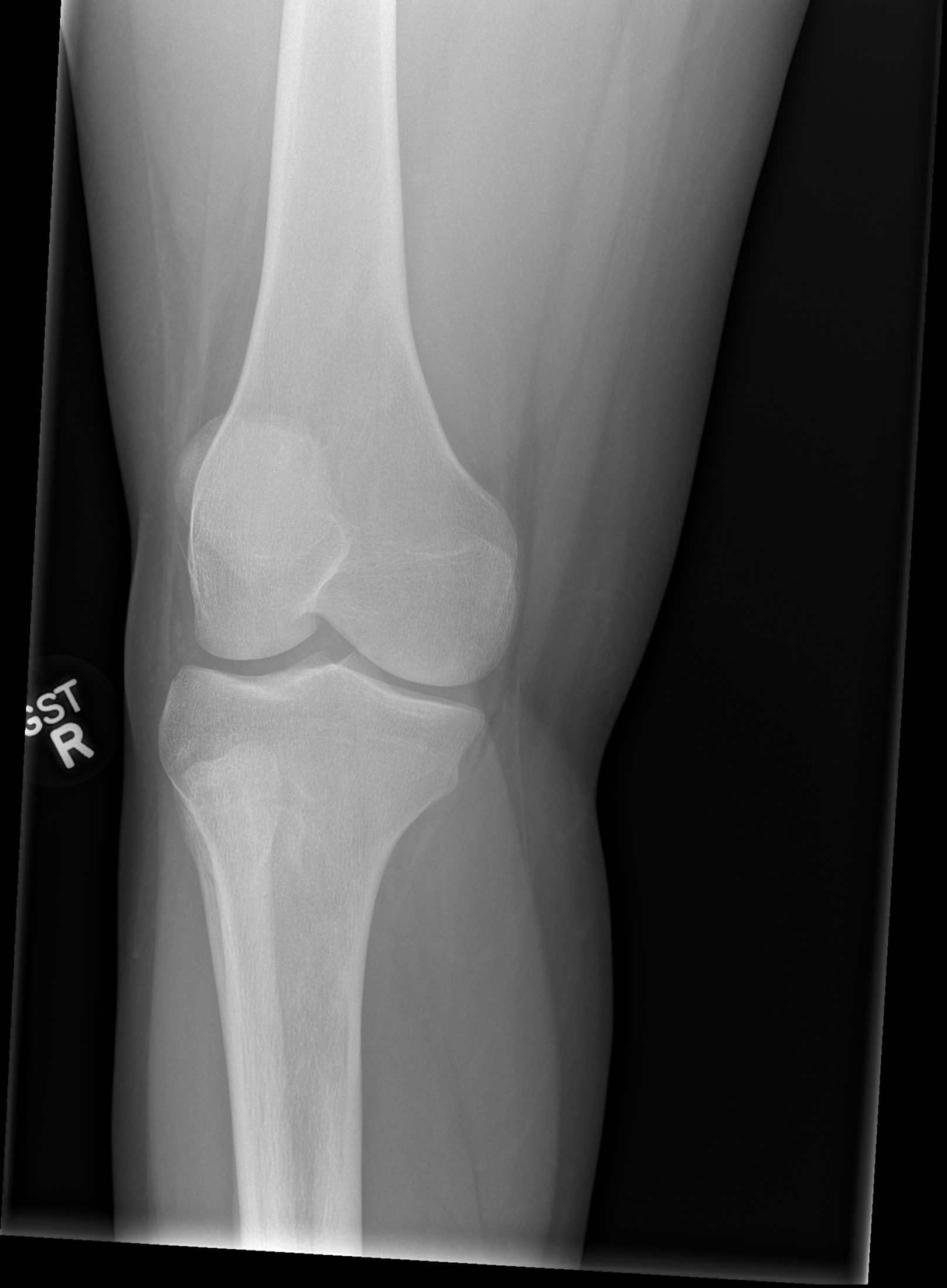

[x knee lat right]
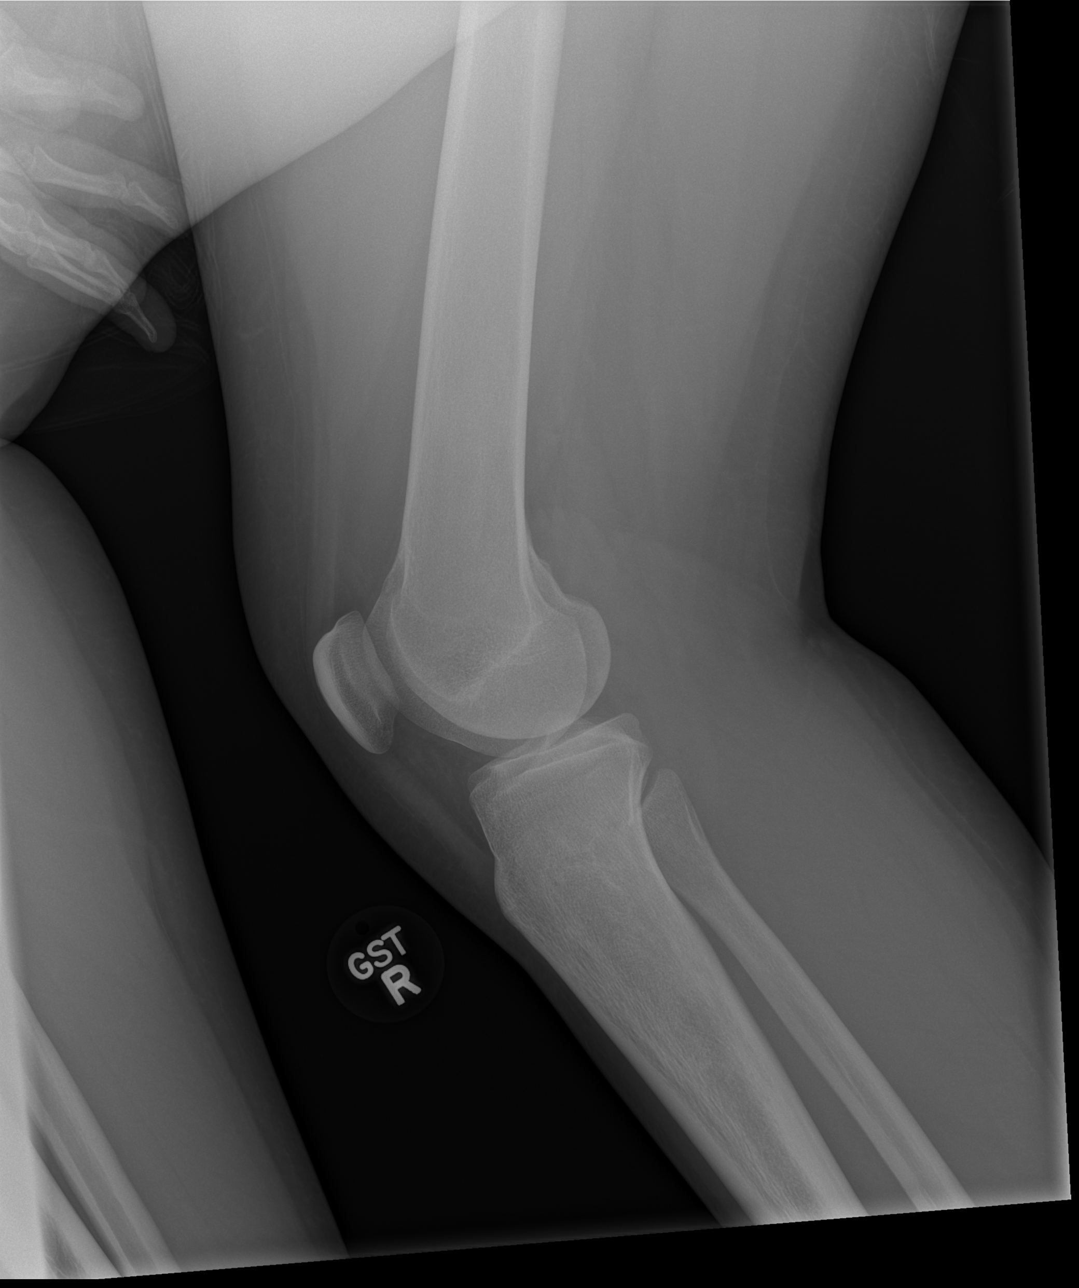

[4 of 4 positions shown; findings below may reference images not displayed]

FINDINGS: No effusion.  Subtle sclerosis in the proximal tibial
diaphysis is stable. Negative for fracture, dislocation, or other
acute abnormality.  Normal alignment and mineralization. No
significant degenerative change.  Regional soft tissues
unremarkable.
IMPRESSION: Negative

## 2012-05-06 ENCOUNTER — Inpatient Hospital Stay: Payer: Self-pay | Admitting: Internal Medicine

## 2012-05-06 LAB — URINALYSIS, COMPLETE
Bacteria: NONE SEEN
Bilirubin,UR: NEGATIVE
Glucose,UR: NEGATIVE mg/dL (ref 0–75)
Ketone: NEGATIVE
Leukocyte Esterase: NEGATIVE
Ph: 5 (ref 4.5–8.0)
Specific Gravity: 1.011 (ref 1.003–1.030)
Squamous Epithelial: 1

## 2012-05-06 LAB — COMPREHENSIVE METABOLIC PANEL
Albumin: 3.8 g/dL (ref 3.4–5.0)
Alkaline Phosphatase: 82 U/L (ref 50–136)
BUN: 9 mg/dL (ref 7–18)
Bilirubin,Total: 2.3 mg/dL — ABNORMAL HIGH (ref 0.2–1.0)
Chloride: 110 mmol/L — ABNORMAL HIGH (ref 98–107)
Creatinine: 0.57 mg/dL — ABNORMAL LOW (ref 0.60–1.30)
EGFR (Non-African Amer.): >60
Glucose: 84 mg/dL (ref 65–99)
SGOT(AST): 33 U/L (ref 15–37)
SGPT (ALT): 25 U/L (ref 12–78)
Sodium: 139 mmol/L (ref 136–145)
Total Protein: 7.5 g/dL (ref 6.4–8.2)

## 2012-05-06 LAB — PREGNANCY, URINE: Pregnancy Test, Urine: NEGATIVE m[IU]/mL

## 2012-05-06 LAB — CBC
HGB: 7.9 g/dL — ABNORMAL LOW (ref 12.0–16.0)
MCHC: 32.8 g/dL (ref 32.0–36.0)
RDW: 20.2 % — ABNORMAL HIGH (ref 11.5–14.5)
WBC: 16 10*3/uL — ABNORMAL HIGH (ref 3.6–11.0)

## 2012-05-06 IMAGING — CR DG ABDOMEN 2V
2 series · 2 of 2 positions shown · non-contrast
Comparison: CT abdomen pelvis dated 06/21/2011

CLINICAL DATA: Acute abdominal pain

ABDOMEN - 2 VIEW

[w abdomen upright]
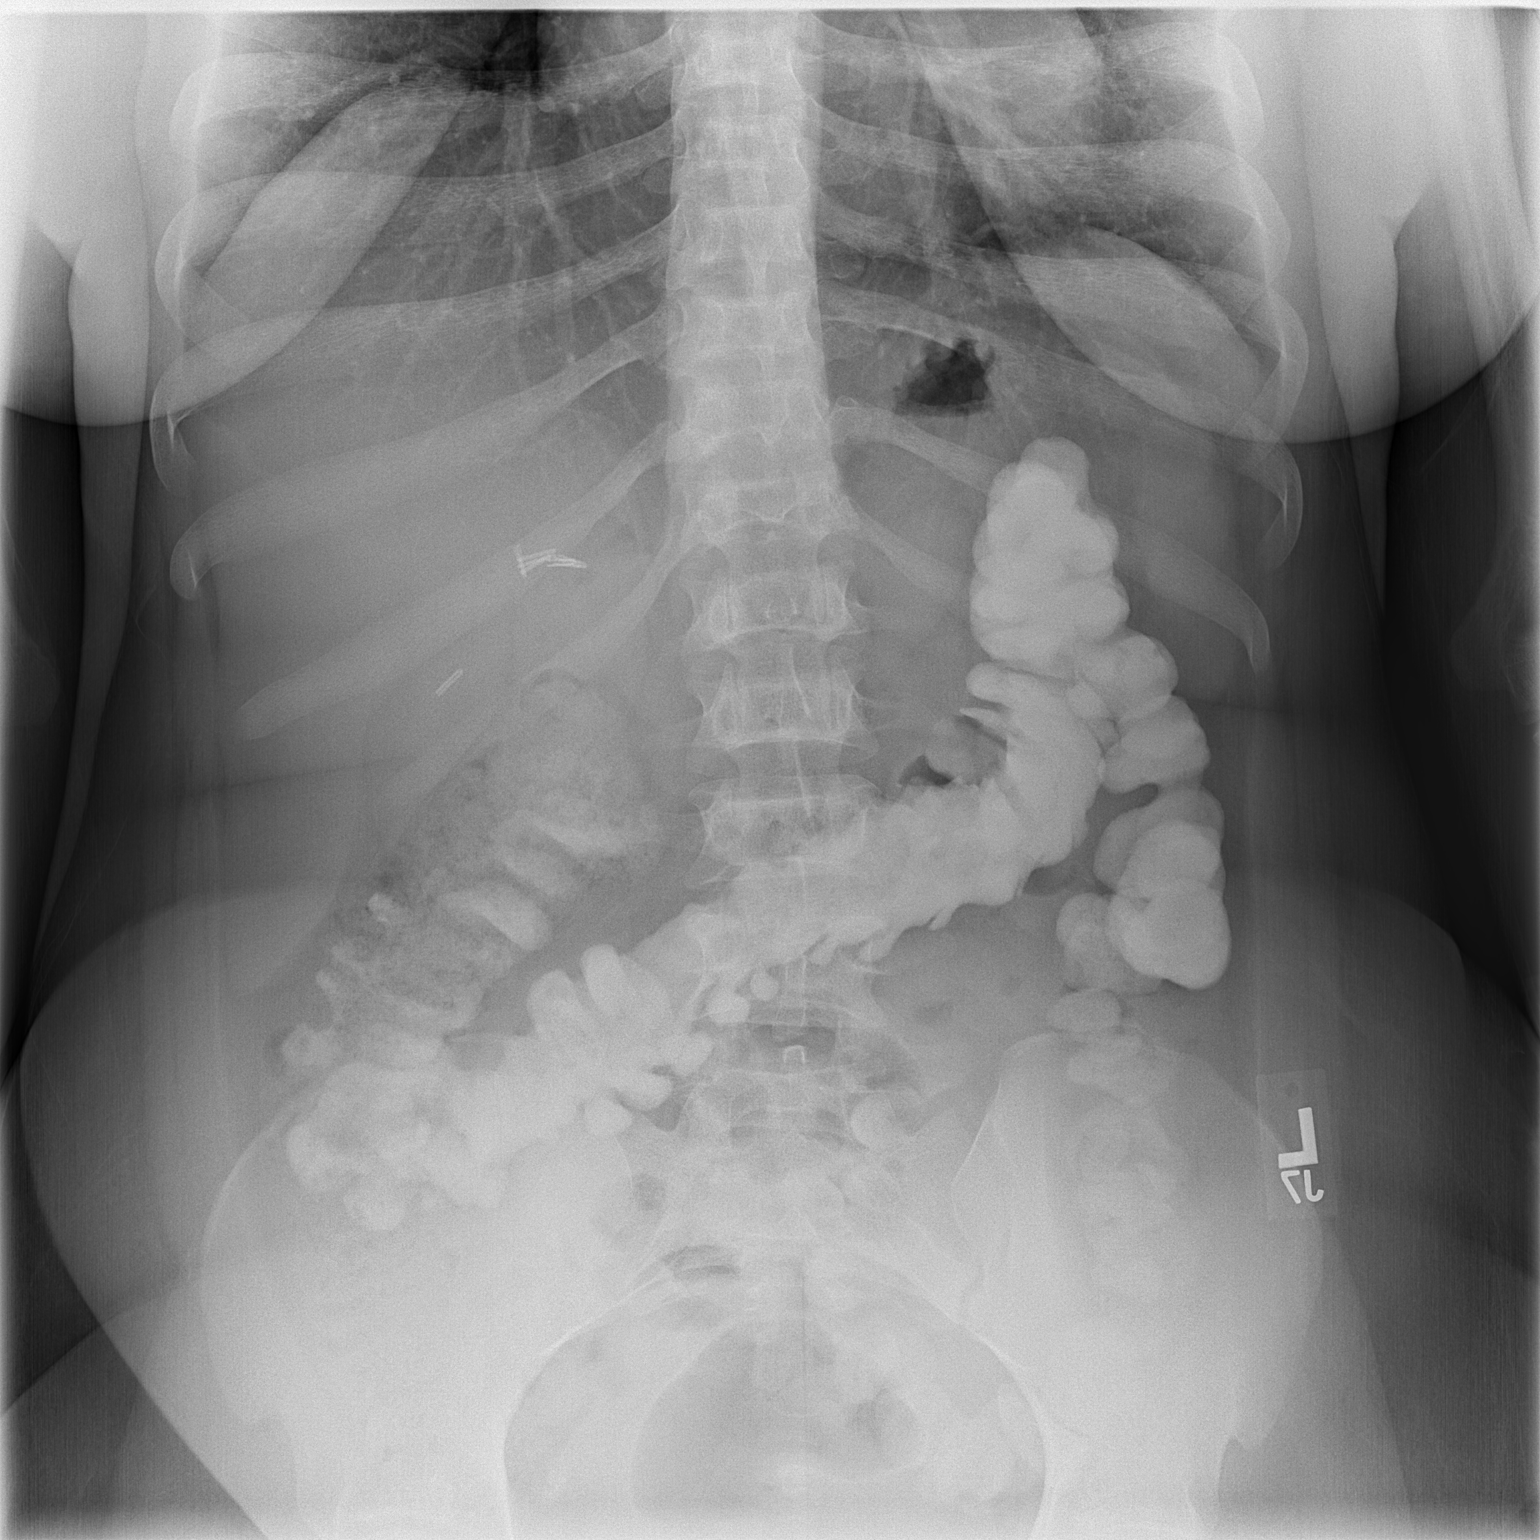

[t abdomen supine]
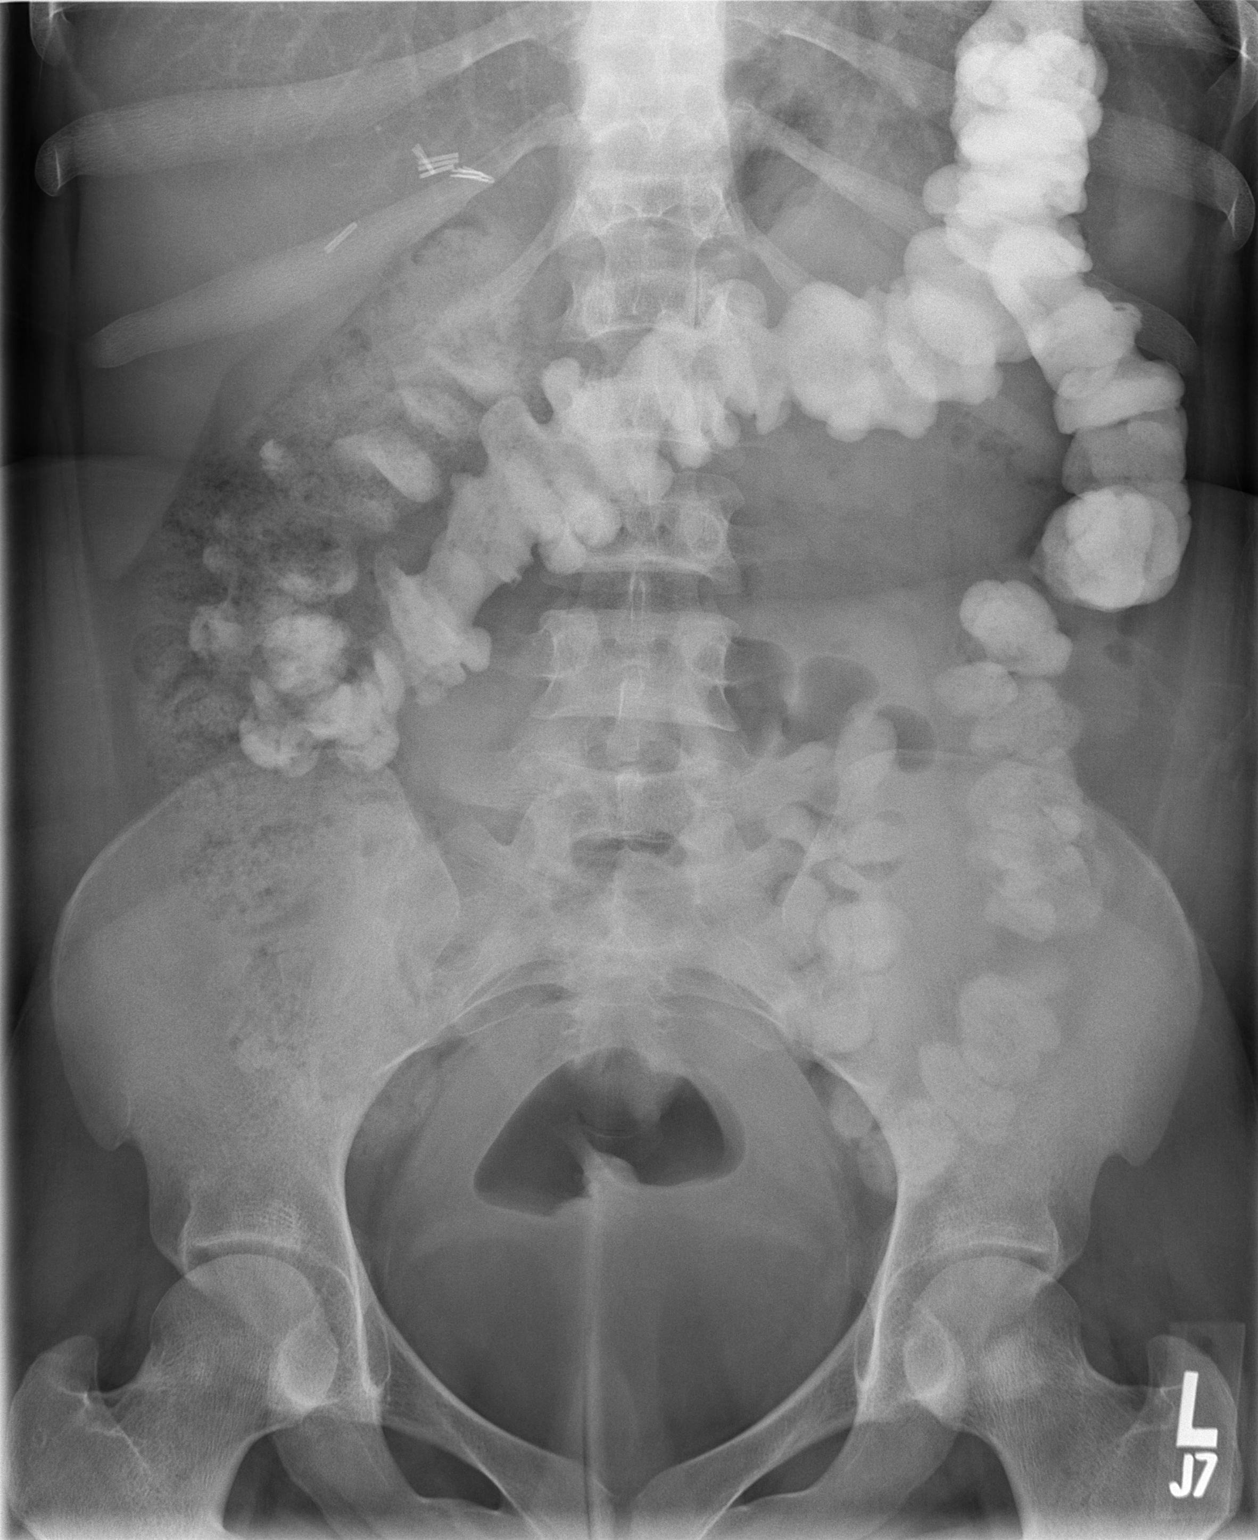

[2 of 2 positions shown; findings below may reference images not displayed]

FINDINGS: Nonobstructive bowel gas pattern.

Residual contrast in the colon.

No evidence of free air under the diaphragm on the upright view.

Cholecystectomy clips.

Vertebral endplate changes related to sickle cell disease.
IMPRESSION: No evidence of small bowel obstruction or free air.

## 2012-05-07 LAB — CBC WITH DIFFERENTIAL/PLATELET
Eosinophil: 4 %
HCT: 21.8 % — ABNORMAL LOW (ref 35.0–47.0)
HGB: 7 g/dL — ABNORMAL LOW (ref 12.0–16.0)
Lymphocytes: 29 %
MCH: 30.1 pg (ref 26.0–34.0)
Metamyelocyte: 1 %
Monocytes: 6 %
NRBC/100 WBC: 6 /
Platelet: 378 10*3/uL (ref 150–440)
RBC: 2.32 10*6/uL — ABNORMAL LOW (ref 3.80–5.20)
RDW: 20.1 % — ABNORMAL HIGH (ref 11.5–14.5)
Segmented Neutrophils: 59 %
WBC: 19.7 10*3/uL — ABNORMAL HIGH (ref 3.6–11.0)

## 2012-05-08 LAB — CBC WITH DIFFERENTIAL/PLATELET
Eosinophil: 1 %
HCT: 24.6 % — ABNORMAL LOW (ref 35.0–47.0)
HGB: 7.9 g/dL — ABNORMAL LOW (ref 12.0–16.0)
Lymphocytes: 21 %
MCH: 29.8 pg (ref 26.0–34.0)
Metamyelocyte: 1 %
Myelocyte: 1 %
NRBC/100 WBC: 11 /
RBC: 2.65 10*6/uL — ABNORMAL LOW (ref 3.80–5.20)
Segmented Neutrophils: 65 %

## 2012-05-09 LAB — CBC WITH DIFFERENTIAL/PLATELET
Eosinophil: 5 %
HCT: 24.3 % — ABNORMAL LOW (ref 35.0–47.0)
Lymphocytes: 29 %
Metamyelocyte: 1 %
Platelet: 452 10*3/uL — ABNORMAL HIGH (ref 150–440)
RBC: 2.6 10*6/uL — ABNORMAL LOW (ref 3.80–5.20)
RDW: 20.7 % — ABNORMAL HIGH (ref 11.5–14.5)
Segmented Neutrophils: 53 %
WBC: 15.9 10*3/uL — ABNORMAL HIGH (ref 3.6–11.0)

## 2012-05-10 LAB — CBC WITH DIFFERENTIAL/PLATELET
Basophil #: 0.1 10*3/uL (ref 0.0–0.1)
Basophil %: 0.9 %
Eosinophil #: 0.8 10*3/uL — ABNORMAL HIGH (ref 0.0–0.7)
Eosinophil %: 4.6 %
Lymphocyte #: 3.5 10*3/uL (ref 1.0–3.6)
Lymphocyte %: 20.6 %
MCH: 31.1 pg (ref 26.0–34.0)
Monocyte #: 1.7 x10 3/mm — ABNORMAL HIGH (ref 0.2–0.9)
Monocyte %: 9.8 %
Monocytes: 10 %
NRBC/100 WBC: 7 /
Neutrophil #: 10.9 10*3/uL — ABNORMAL HIGH (ref 1.4–6.5)
Platelet: 465 10*3/uL — ABNORMAL HIGH (ref 150–440)
RDW: 20.2 % — ABNORMAL HIGH (ref 11.5–14.5)
Segmented Neutrophils: 59 %
WBC: 17 10*3/uL — ABNORMAL HIGH (ref 3.6–11.0)

## 2012-05-11 ENCOUNTER — Emergency Department (HOSPITAL_COMMUNITY)
Admission: EM | Admit: 2012-05-11 | Discharge: 2012-05-11 | Disposition: A | Discharge status: Home / Self Care | Payer: Medicaid Other | Attending: Emergency Medicine | Admitting: Emergency Medicine

## 2012-05-11 ENCOUNTER — Encounter (HOSPITAL_COMMUNITY): Payer: Self-pay | Admitting: Emergency Medicine

## 2012-05-11 DIAGNOSIS — D5700 Hb-SS disease with crisis, unspecified: Secondary | ICD-10-CM | POA: Insufficient documentation

## 2012-05-11 DIAGNOSIS — Z8619 Personal history of other infectious and parasitic diseases: Secondary | ICD-10-CM | POA: Insufficient documentation

## 2012-05-11 DIAGNOSIS — Z8679 Personal history of other diseases of the circulatory system: Secondary | ICD-10-CM | POA: Insufficient documentation

## 2012-05-11 DIAGNOSIS — Z8659 Personal history of other mental and behavioral disorders: Secondary | ICD-10-CM | POA: Insufficient documentation

## 2012-05-11 DIAGNOSIS — Z8719 Personal history of other diseases of the digestive system: Secondary | ICD-10-CM | POA: Insufficient documentation

## 2012-05-11 LAB — CBC WITH DIFFERENTIAL/PLATELET
Bands: 1 %
Eosinophil: 3 %
Eosinophils Absolute: 0.7 10*3/uL (ref 0.0–0.7)
Eosinophils Relative: 5 % (ref 0–5)
Lymphocytes: 19 %
Lymphs Abs: 2.9 10*3/uL (ref 0.7–4.0)
MCH: 30.4 pg (ref 26.0–34.0)
MCHC: 32.3 g/dL (ref 32.0–36.0)
MCHC: 33.7 g/dL (ref 30.0–36.0)
MCV: 90.3 fL (ref 78.0–100.0)
Monocytes Absolute: 1 10*3/uL (ref 0.1–1.0)
NRBC/100 WBC: 9 /
Neutrophils Relative %: 67 % (ref 43–77)
Platelet: 497 10*3/uL — ABNORMAL HIGH (ref 150–440)
Platelets: 555 10*3/uL — ABNORMAL HIGH (ref 150–400)
RBC: 2.86 10*6/uL — ABNORMAL LOW (ref 3.80–5.20)
RBC: 2.99 MIL/uL — ABNORMAL LOW (ref 3.87–5.11)
RDW: 20 % — ABNORMAL HIGH (ref 11.5–14.5)
Segmented Neutrophils: 71 %
WBC: 14.2 10*3/uL — ABNORMAL HIGH (ref 3.6–11.0)

## 2012-05-11 LAB — COMPREHENSIVE METABOLIC PANEL
ALT: 31 U/L (ref 0–35)
AST: 32 U/L (ref 0–37)
CO2: 25 mEq/L (ref 19–32)
Calcium: 9.1 mg/dL (ref 8.4–10.5)
Chloride: 103 mEq/L (ref 96–112)
Creatinine, Ser: 0.61 mg/dL (ref 0.50–1.10)
GFR calc Af Amer: >90 mL/min (ref >90)
GFR calc non Af Amer: >90 mL/min (ref >90)
Glucose, Bld: 80 mg/dL (ref 70–99)
Sodium: 137 mEq/L (ref 135–145)
Total Bilirubin: 2.4 mg/dL — ABNORMAL HIGH (ref 0.3–1.2)

## 2012-05-11 LAB — RETICULOCYTES: Retic Count, Absolute: 879.1 10*3/uL — ABNORMAL HIGH (ref 19.0–186.0)

## 2012-05-11 MED ORDER — HYDROMORPHONE HCL 2 MG PO TABS
2.0000 mg | ORAL_TABLET | Freq: Once | ORAL | Status: DC
  Filled 2012-05-11: qty 1

## 2012-05-11 MED ORDER — HYDROMORPHONE HCL PF 2 MG/ML IJ SOLN
2.0000 mg | Freq: Once | INTRAMUSCULAR | Status: AC
  Administered 2012-05-11: 2 mg via INTRAVENOUS
  Filled 2012-05-11: qty 1

## 2012-05-11 MED ORDER — HYDROMORPHONE HCL PF 2 MG/ML IJ SOLN
2.0000 mg | Freq: Once | INTRAMUSCULAR | Status: AC
  Administered 2012-05-11: 2 mg via INTRAMUSCULAR
  Filled 2012-05-11: qty 1

## 2012-05-11 MED ORDER — SODIUM CHLORIDE 0.9 % IV BOLUS (SEPSIS)
1000.0000 mL | INTRAVENOUS | Status: AC
  Administered 2012-05-11: 1000 mL via INTRAVENOUS

## 2012-05-11 MED ORDER — OXYCODONE-ACETAMINOPHEN 5-325 MG PO TABS
2.0000 | ORAL_TABLET | Freq: Once | ORAL | Status: AC
  Administered 2012-05-11: 2 via ORAL
  Filled 2012-05-11: qty 2

## 2012-05-11 NOTE — ED Notes (Signed)
Pt was discharged from [facility] today and came directly here because she "didn't like her care".  States that the doctor over there put her on fluid restrictions and limited her to 2 mg of dilaudid q 6 hrs.  Had an IV in her foot.  Has not had any pain medication since about 0900 today.  Back, lt arm, knee pain.  Pain 8/10.

## 2012-05-11 NOTE — ED Notes (Signed)
Unable to collect labs with IV start. Phlebotomist to be notified.

## 2012-05-11 NOTE — ED Notes (Signed)
Patient refused PO Dilaudid

## 2012-05-11 NOTE — ED Notes (Signed)
IV team at bedside 

## 2012-05-11 NOTE — ED Provider Notes (Signed)
Medical screening examination/treatment/procedure(s) were conducted as a shared visit with non-physician practitioner(s) and myself.  I personally evaluated the patient during the encounter   Pt reports improving pain.  She is ambulatory.  Stable for d/c  Joya Gaskins, MD 05/11/12 [phone]

## 2012-05-11 NOTE — ED Notes (Signed)
IV team notified of need for IV

## 2012-05-11 NOTE — ED Provider Notes (Signed)
History     CSN: [id number]  Arrival date & time 05/11/12  1319   First MD Initiated Contact with Patient 05/11/12 1425      Chief Complaint  Patient presents with  . Sickle Cell Pain Crisis    (Consider location/radiation/quality/duration/timing/severity/associated sxs/prior treatment) HPI Comments: This is a 21 year old female, who presents to the ED with a chief complaint of sickle cell pain crisis.  The patient was seen earlier today by [facility], and was discharged.  The patient said that she called Dr. August Saucer, and he told her to come here.  She states that she had a fever of 104, but she is afebrile here.  She states that her pain is 8/10.  She is complaining of pain in her left shoulder, back, and bilateral knees.  Nothing makes her symptoms worse or better.    The history is provided by the patient. No language interpreter was used.    Past Medical History  Diagnosis Date  . H/O: 1 miscarriage 03/22/2011  . 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 Depression 01/06/2011  . GERD (gastroesophageal reflux disease) 02/17/2011  . Trichotillomania     h/o  . Blood transfusion     "lots"  . Sickle cell anemia with crisis   . Exertional dyspnea     "sometimes"  . Sickle cell anemia   . Headache   . Migraines 11/08/11    "@ least twice/month"  . Chronic back pain     "very severe; have knot in my back; from tight muscle; take RX and exercise for it"  . Mood swings 11/08/11    "I go back and forth; real bad"  . Pregnancy     Past Surgical History  Procedure Date  . Cholecystectomy 05/2010  . Dilation and curettage of uterus 02/20/11    S/P miscarriage    Family History  Problem Relation Age of Onset  . Diabetes Mother     History  Substance Use Topics  . Smoking status: Never Smoker   . Smokeless tobacco: Never Used  . Alcohol Use: No     Comment: pt states she quit in May    OB History    Grav Para Term Preterm Abortions TAB SAB Ect Mult Living   2    1  1           Obstetric Comments   Miscarried in October 2012 at about 7 weeks      Review of Systems  All other systems reviewed and are negative.    Allergies  Carrot oil; Latex; and Toradol  Home Medications   Current Outpatient Rx  Name  Route  Sig  Dispense  Refill  . HYDROXYUREA 500 MG PO CAPS   Oral   Take 1,000 mg by mouth daily. May take with food to minimize GI side effects.         . OXYCODONE HCL 15 MG PO TABS   Oral   Take 15 mg by mouth every 4 (four) hours as needed. For pain.           BP 115/72  Pulse 100  Temp 98.4 F (36.9 C) (Oral)  Resp 16  SpO2 100%  LMP 05/10/2012  Breastfeeding? Unknown  Physical Exam  Nursing note and vitals reviewed. Constitutional: She is oriented to person, place, and time. She appears well-developed and well-nourished.  HENT:  Head: Normocephalic and atraumatic.  Eyes: Conjunctivae normal and EOM are normal. Pupils are equal, round, and reactive to light.  Neck: Normal range of motion. Neck supple.  Cardiovascular: Normal rate and regular rhythm.  Exam reveals no gallop and no friction rub.   No murmur heard. Pulmonary/Chest: Effort normal and breath sounds normal. No respiratory distress. She has no wheezes. She has no rales. She exhibits no tenderness.  Abdominal: Soft. Bowel sounds are normal. She exhibits no distension and no mass. There is no tenderness. There is no rebound and no guarding.  Musculoskeletal: Normal range of motion. She exhibits no edema and no tenderness.       ROM and strength intact bilaterally.  Neurological: She is alert and oriented to person, place, and time.  Skin: Skin is warm and dry.  Psychiatric: She has a normal mood and affect. Her behavior is normal. Judgment and thought content normal.    ED Course  Procedures (including critical care time)  Results for orders placed during the hospital encounter of 05/11/12  CBC WITH DIFFERENTIAL      Component Value Range   WBC 13.9 (*) 4.0 - 10.5  K/uL   RBC 2.99 (*) 3.87 - 5.11 MIL/uL   Hemoglobin 9.1 (*) 12.0 - 15.0 g/dL   HCT 16.1 (*) 09.6 - 04.5 %   MCV 90.3  78.0 - 100.0 fL   MCH 30.4  26.0 - 34.0 pg   MCHC 33.7  30.0 - 36.0 g/dL   RDW 40.9 (*) 81.1 - 91.4 %   Platelets 555 (*) 150 - 400 K/uL   Neutrophils Relative 67  43 - 77 %   Lymphocytes Relative 21  12 - 46 %   Monocytes Relative 7  3 - 12 %   Eosinophils Relative 5  0 - 5 %   Basophils Relative 0  0 - 1 %   Neutro Abs 9.3 (*) 1.7 - 7.7 K/uL   Lymphs Abs 2.9  0.7 - 4.0 K/uL   Monocytes Absolute 1.0  0.1 - 1.0 K/uL   Eosinophils Absolute 0.7  0.0 - 0.7 K/uL   Basophils Absolute 0.0  0.0 - 0.1 K/uL   RBC Morphology MARKED POLYCHROMASIA     Smear Review PLATELET COUNT CONFIRMED BY SMEAR    RETICULOCYTES      Component Value Range   Retic Ct Pct 29.4 (*) 0.4 - 3.1 %   RBC. 2.99 (*) 3.87 - 5.11 MIL/uL   Retic Count, Manual 879.1 (*) 19.0 - 186.0 K/uL  COMPREHENSIVE METABOLIC PANEL      Component Value Range   Sodium 137  135 - 145 mEq/L   Potassium 4.1  3.5 - 5.1 mEq/L   Chloride 103  96 - 112 mEq/L   CO2 25  19 - 32 mEq/L   Glucose, Bld 80  70 - 99 mg/dL   BUN 8  6 - 23 mg/dL   Creatinine, Ser 7.82  0.50 - 1.10 mg/dL   Calcium 9.1  8.4 - 95.6 mg/dL   Total Protein 7.7  6.0 - 8.3 g/dL   Albumin 3.9  3.5 - 5.2 g/dL   AST 32  0 - 37 U/L   ALT 31  0 - 35 U/L   Alkaline Phosphatase 65  39 - 117 U/L   Total Bilirubin 2.4 (*) 0.3 - 1.2 mg/dL   GFR calc non Af Amer >90  >90 mL/min   GFR calc Af Amer >90  >90 mL/min       1. Sickle cell pain crisis       MDM    6:22 PM Discussed  with Dr. Bebe Shaggy, who will go see the patient.  7:11 PM Patient seen by Dr. Bebe Shaggy, and discussed further. Will give the patient Dilaudid and Percocet, and have the patient followup closely on an outpatient basis. Patient understands and agrees with the plan.  Dr. Bebe Shaggy agrees with the plan, and has reviewed the labs and seen the patient.  The patient's pain seems to be  here typical pain, but is NOT associated with a relative decrease in Hgb.  Therefore, we feel that it is appropriate for the patient to follow-up if her pain can be controlled in the ED.  7:26 PM Patient states that she is feeling better.  Will discharge to home.        Roxy Horseman, PA-C 05/11/12 1926

## 2012-05-12 LAB — CULTURE, BLOOD (SINGLE)

## 2012-05-28 IMAGING — CR DG CHEST 2V
2 series · 2 of 2 positions shown · non-contrast
Comparison: 06/09/2011.

CLINICAL DATA: Chest pain.  Back pain.  Sickle cell.

CHEST - 2 VIEW

[w chest pa]
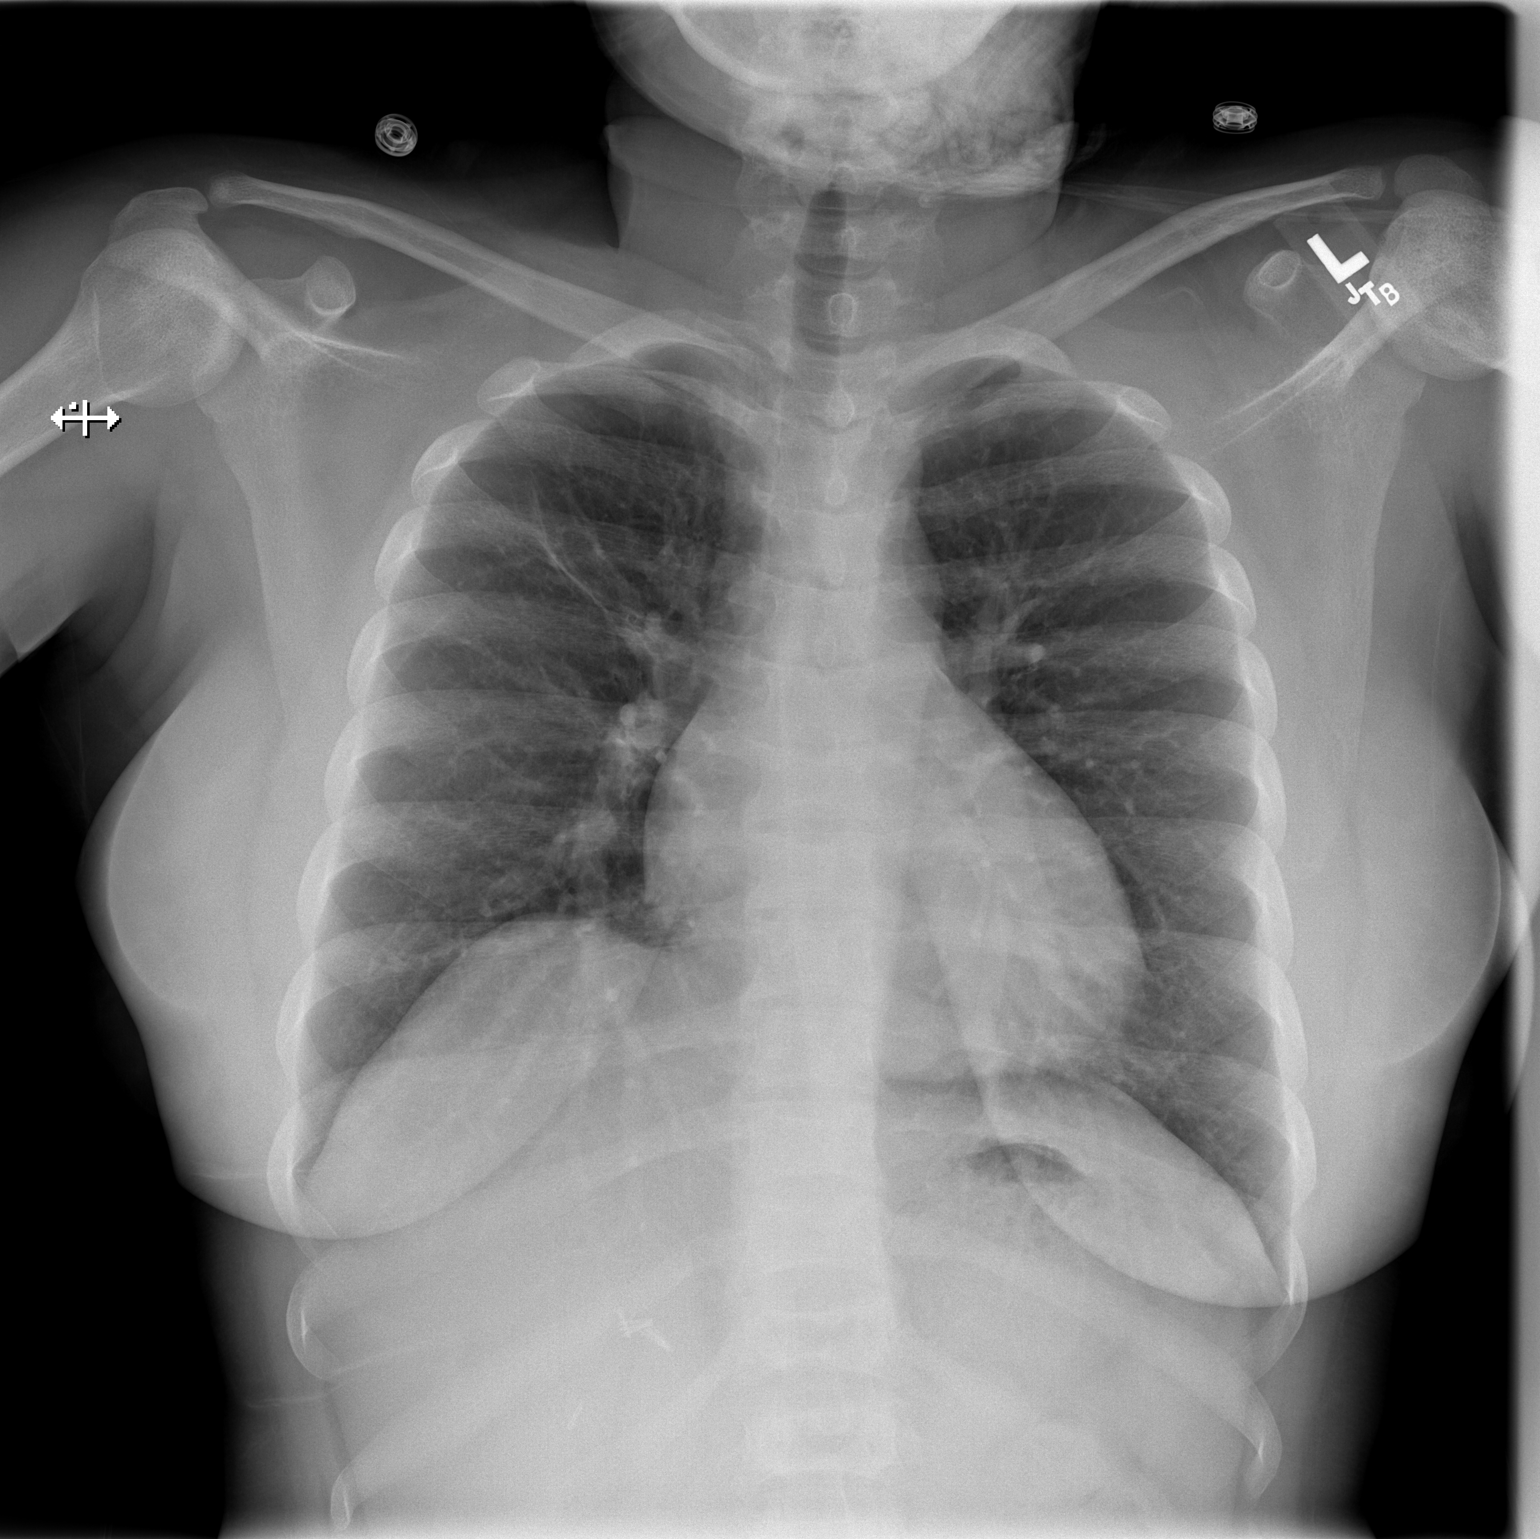

[w chest lat]
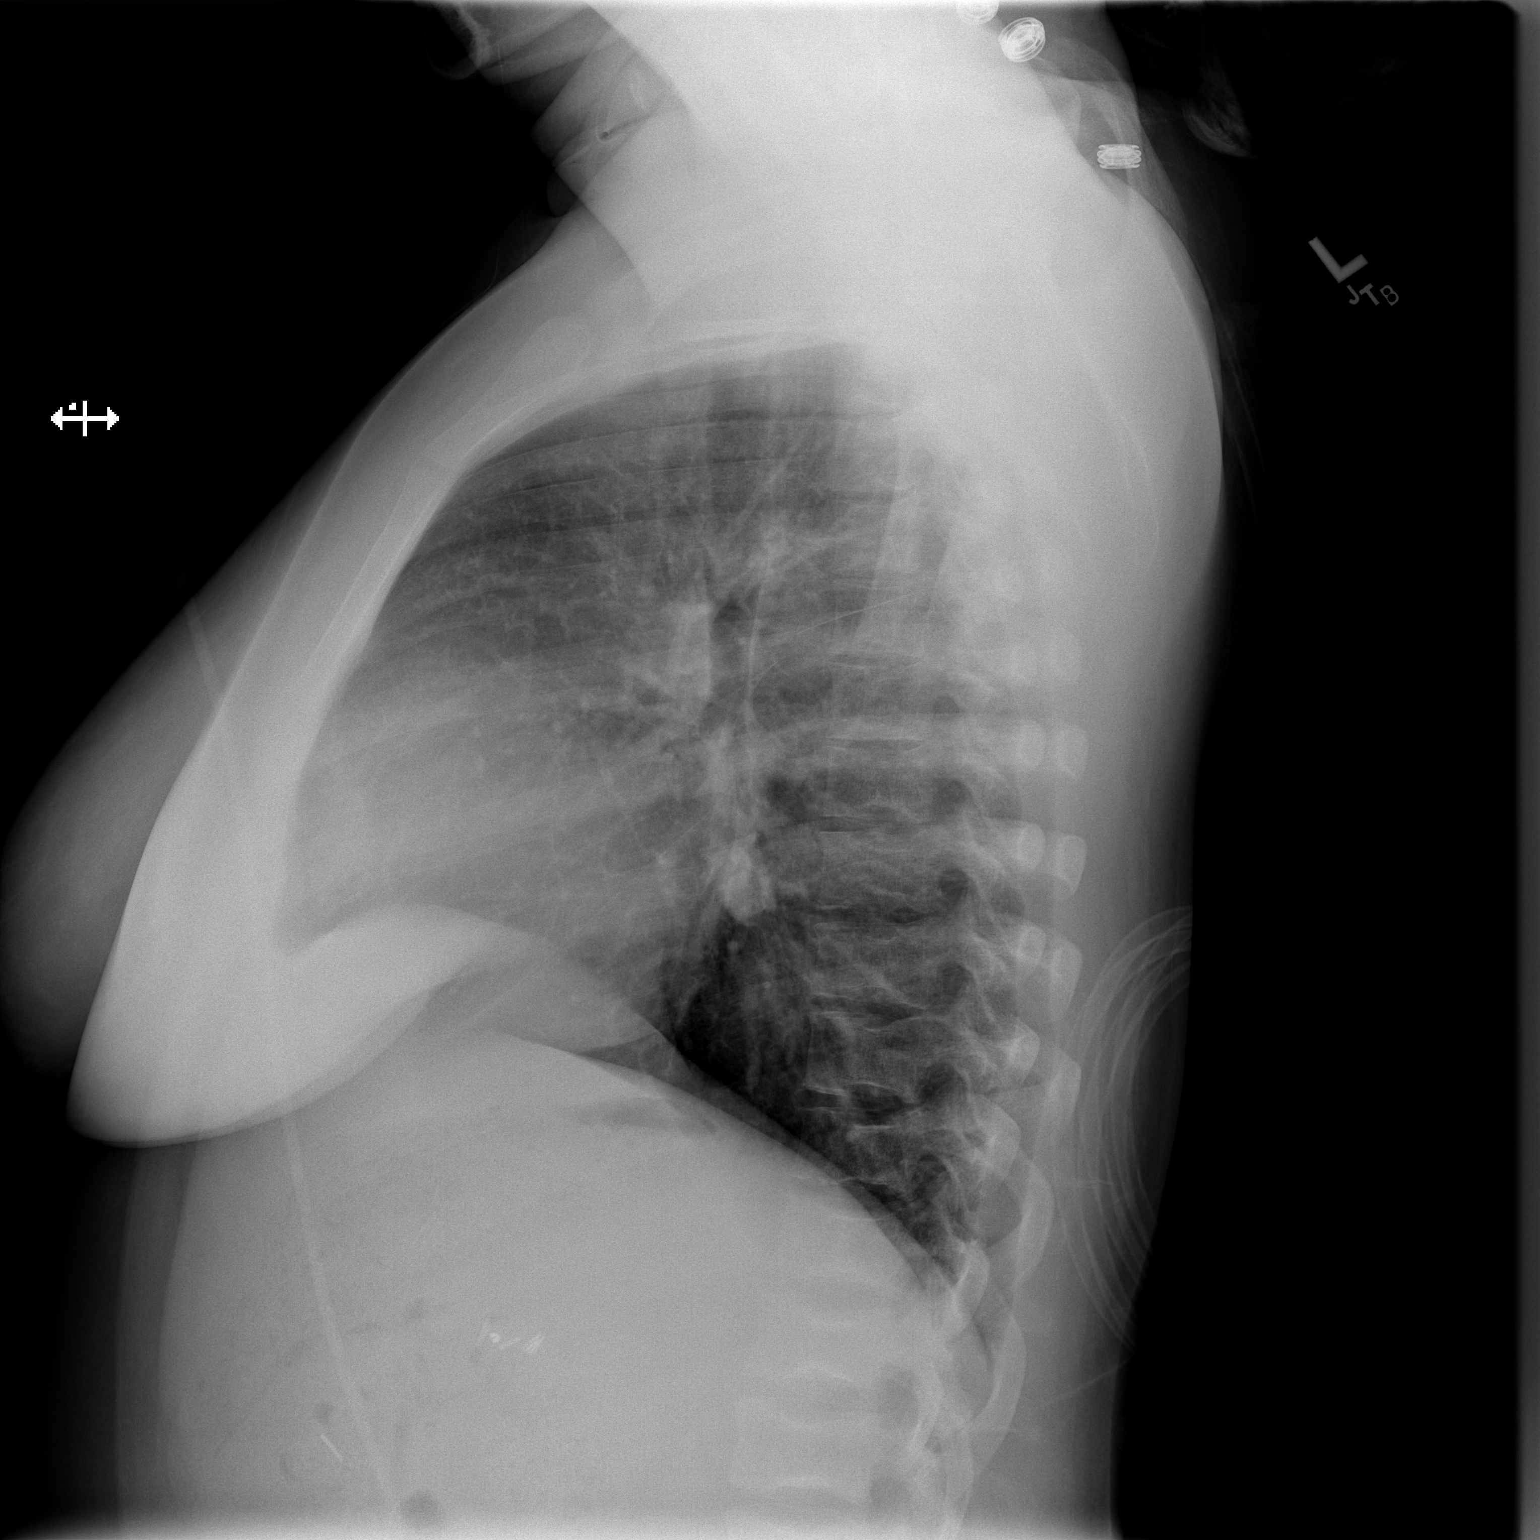

[2 of 2 positions shown; findings below may reference images not displayed]

FINDINGS: Trachea is midline.  Heart size stable.  Linear scarring
in the right upper lobe.  Lungs are otherwise clear.  No pleural
fluid.
IMPRESSION: No acute findings.

## 2012-06-18 NOTE — Telephone Encounter (Signed)
See phone call note

## 2012-06-19 IMAGING — CR DG CHEST 1V
1 series · 1 of 1 positions shown · non-contrast
Comparison: Chest radiograph performed 01/11/2012

CLINICAL DATA: Pain under the rest and along the abdomen; history
of sickle cell disease.

CHEST - 1 VIEW

[w chest pa]
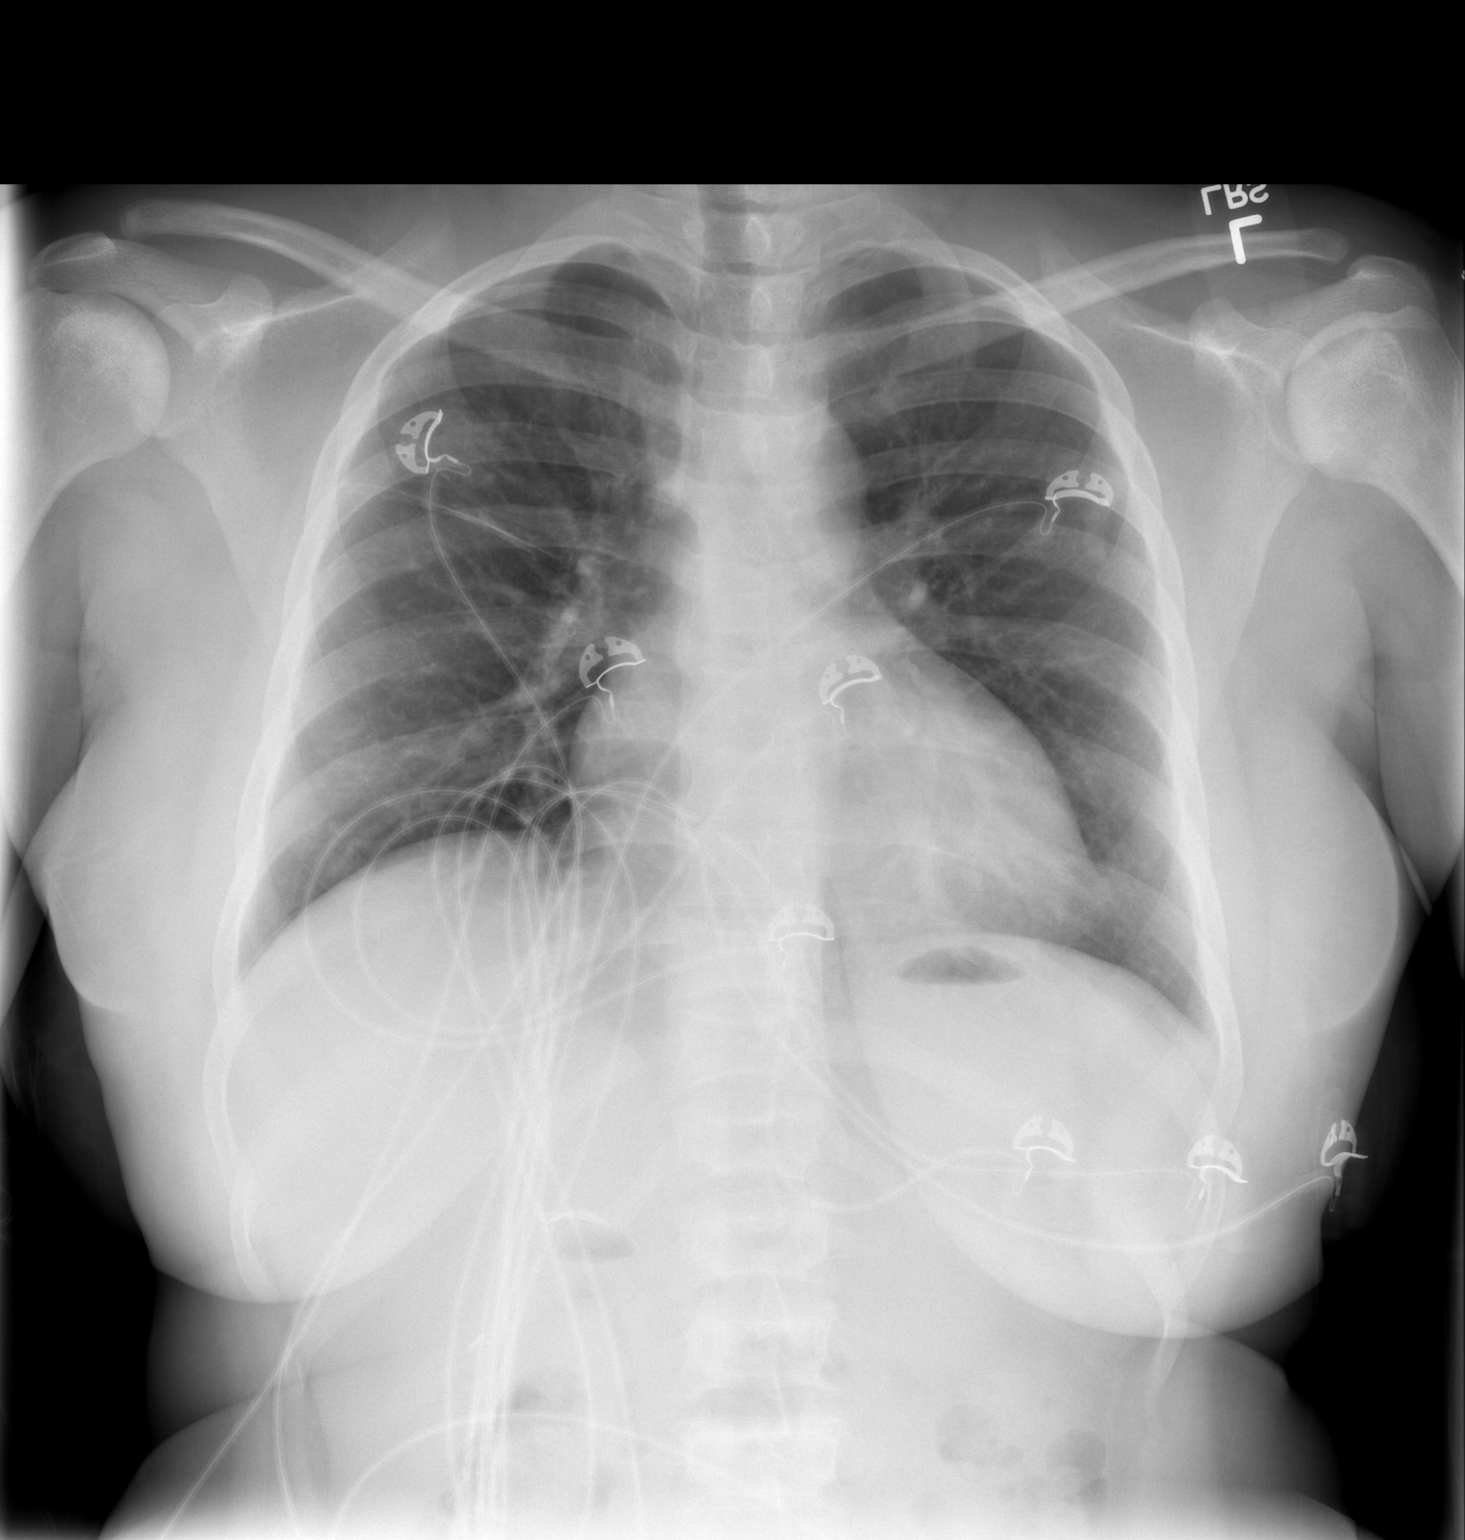

[1 of 1 positions shown; findings below may reference images not displayed]

FINDINGS: The lungs are well-aerated.  There is mild thickening of
the right minor fissure, nonspecific in appearance.  There is no
evidence of focal opacification, pleural effusion or pneumothorax.

The cardiomediastinal silhouette is borderline normal in size.  No
acute osseous abnormalities are seen; chronic vertebral body
changes reflect the patient's sickle cell disease.  Clips are noted
within the right upper quadrant, reflecting prior cholecystectomy.
IMPRESSION: No acute cardiopulmonary process seen; mild nonspecific thickening
of the right minor fissure likely remains within normal limits.

## 2012-06-22 ENCOUNTER — Inpatient Hospital Stay (HOSPITAL_COMMUNITY): Payer: Medicaid Other

## 2012-06-22 ENCOUNTER — Inpatient Hospital Stay (HOSPITAL_COMMUNITY)
Admission: EM | Admit: 2012-06-22 | Discharge: 2012-06-27 | DRG: 812 | Disposition: A | Discharge status: Home / Self Care | Payer: Medicaid Other | Attending: Internal Medicine | Admitting: Internal Medicine

## 2012-06-22 ENCOUNTER — Encounter (HOSPITAL_COMMUNITY): Payer: Self-pay | Admitting: Neurology

## 2012-06-22 DIAGNOSIS — R112 Nausea with vomiting, unspecified: Secondary | ICD-10-CM | POA: Diagnosis not present

## 2012-06-22 DIAGNOSIS — F329 Major depressive disorder, single episode, unspecified: Secondary | ICD-10-CM | POA: Diagnosis present

## 2012-06-22 DIAGNOSIS — Z79899 Other long term (current) drug therapy: Secondary | ICD-10-CM

## 2012-06-22 DIAGNOSIS — F39 Unspecified mood [affective] disorder: Secondary | ICD-10-CM | POA: Diagnosis present

## 2012-06-22 DIAGNOSIS — K219 Gastro-esophageal reflux disease without esophagitis: Secondary | ICD-10-CM

## 2012-06-22 DIAGNOSIS — R51 Headache: Secondary | ICD-10-CM

## 2012-06-22 DIAGNOSIS — M79651 Pain in right thigh: Secondary | ICD-10-CM

## 2012-06-22 DIAGNOSIS — M549 Dorsalgia, unspecified: Secondary | ICD-10-CM

## 2012-06-22 DIAGNOSIS — F17200 Nicotine dependence, unspecified, uncomplicated: Secondary | ICD-10-CM

## 2012-06-22 DIAGNOSIS — D72829 Elevated white blood cell count, unspecified: Secondary | ICD-10-CM | POA: Diagnosis not present

## 2012-06-22 DIAGNOSIS — R000 Tachycardia, unspecified: Secondary | ICD-10-CM

## 2012-06-22 DIAGNOSIS — D571 Sickle-cell disease without crisis: Secondary | ICD-10-CM

## 2012-06-22 DIAGNOSIS — E663 Overweight: Secondary | ICD-10-CM

## 2012-06-22 DIAGNOSIS — F6389 Other impulse disorders: Secondary | ICD-10-CM

## 2012-06-22 DIAGNOSIS — Z888 Allergy status to other drugs, medicaments and biological substances status: Secondary | ICD-10-CM

## 2012-06-22 DIAGNOSIS — Z91040 Latex allergy status: Secondary | ICD-10-CM

## 2012-06-22 DIAGNOSIS — F32A Depression, unspecified: Secondary | ICD-10-CM

## 2012-06-22 DIAGNOSIS — D5700 Hb-SS disease with crisis, unspecified: Principal | ICD-10-CM

## 2012-06-22 DIAGNOSIS — Z6832 Body mass index (BMI) 32.0-32.9, adult: Secondary | ICD-10-CM

## 2012-06-22 DIAGNOSIS — K5900 Constipation, unspecified: Secondary | ICD-10-CM | POA: Diagnosis not present

## 2012-06-22 DIAGNOSIS — F439 Reaction to severe stress, unspecified: Secondary | ICD-10-CM

## 2012-06-22 DIAGNOSIS — G8929 Other chronic pain: Secondary | ICD-10-CM | POA: Diagnosis present

## 2012-06-22 DIAGNOSIS — Z9089 Acquired absence of other organs: Secondary | ICD-10-CM

## 2012-06-22 DIAGNOSIS — E669 Obesity, unspecified: Secondary | ICD-10-CM | POA: Diagnosis present

## 2012-06-22 LAB — CBC WITH DIFFERENTIAL/PLATELET
Basophils Absolute: 0 10*3/uL (ref 0.0–0.1)
Eosinophils Absolute: 0.5 10*3/uL (ref 0.0–0.7)
Lymphocytes Relative: 12 % (ref 12–46)
MCH: 31.4 pg (ref 26.0–34.0)
MCHC: 35 g/dL (ref 30.0–36.0)
Monocytes Absolute: 1.6 10*3/uL — ABNORMAL HIGH (ref 0.1–1.0)
Neutrophils Relative %: 75 % (ref 43–77)
Platelets: 509 10*3/uL — ABNORMAL HIGH (ref 150–400)

## 2012-06-22 LAB — URINALYSIS, ROUTINE W REFLEX MICROSCOPIC
Ketones, ur: NEGATIVE mg/dL
Leukocytes, UA: NEGATIVE
Nitrite: NEGATIVE
Protein, ur: NEGATIVE mg/dL
Urobilinogen, UA: 1 mg/dL (ref 0.0–1.0)

## 2012-06-22 LAB — HEPATIC FUNCTION PANEL
Alkaline Phosphatase: 63 U/L (ref 39–117)
Bilirubin, Direct: 0.3 mg/dL (ref 0.0–0.3)
Total Bilirubin: 2.5 mg/dL — ABNORMAL HIGH (ref 0.3–1.2)

## 2012-06-22 LAB — CBC
MCH: 30.8 pg (ref 26.0–34.0)
Platelets: 509 10*3/uL — ABNORMAL HIGH (ref 150–400)
RBC: 2.86 MIL/uL — ABNORMAL LOW (ref 3.87–5.11)
RDW: 17 % — ABNORMAL HIGH (ref 11.5–15.5)
WBC: 15 10*3/uL — ABNORMAL HIGH (ref 4.0–10.5)

## 2012-06-22 LAB — RETICULOCYTES
Retic Count, Absolute: 538.6 10*3/uL — ABNORMAL HIGH (ref 19.0–186.0)
Retic Ct Pct: 17.6 % — ABNORMAL HIGH (ref 0.4–3.1)

## 2012-06-22 LAB — BASIC METABOLIC PANEL
CO2: 24 mEq/L (ref 19–32)
Chloride: 101 mEq/L (ref 96–112)
GFR calc Af Amer: >90 mL/min (ref >90)
Potassium: 4.2 mEq/L (ref 3.5–5.1)

## 2012-06-22 LAB — CREATININE, SERUM
Creatinine, Ser: 0.59 mg/dL (ref 0.50–1.10)
GFR calc Af Amer: >90 mL/min (ref >90)

## 2012-06-22 MED ORDER — HYDROMORPHONE HCL PF 1 MG/ML IJ SOLN
1.0000 mg | Freq: Once | INTRAMUSCULAR | Status: AC
  Administered 2012-06-22: 1 mg via INTRAVENOUS
  Filled 2012-06-22: qty 1

## 2012-06-22 MED ORDER — ENOXAPARIN SODIUM 40 MG/0.4ML ~~LOC~~ SOLN
40.0000 mg | SUBCUTANEOUS | Status: DC
  Administered 2012-06-22 – 2012-06-26 (×5): 40 mg via SUBCUTANEOUS
  Filled 2012-06-22 (×6): qty 0.4

## 2012-06-22 MED ORDER — ONDANSETRON HCL 4 MG PO TABS: 4.0000 mg | ORAL_TABLET | ORAL | Status: DC | PRN

## 2012-06-22 MED ORDER — SODIUM CHLORIDE 0.9 % IV BOLUS (SEPSIS)
1000.0000 mL | Freq: Once | INTRAVENOUS | Status: AC
  Administered 2012-06-22: 1000 mL via INTRAVENOUS

## 2012-06-22 MED ORDER — FOLIC ACID 1 MG PO TABS
1.0000 mg | ORAL_TABLET | Freq: Every day | ORAL | Status: DC
  Administered 2012-06-22 – 2012-06-27 (×6): 1 mg via ORAL
  Filled 2012-06-22 (×6): qty 1

## 2012-06-22 MED ORDER — IOHEXOL 300 MG/ML  SOLN
50.0000 mL | Freq: Once | INTRAMUSCULAR | Status: AC | PRN
  Administered 2012-06-22: 50 mL via ORAL

## 2012-06-22 MED ORDER — SODIUM CHLORIDE 0.45 % IV SOLN
INTRAVENOUS | Status: DC
  Administered 2012-06-22: 150 mL via INTRAVENOUS
  Administered 2012-06-23 – 2012-06-25 (×5): via INTRAVENOUS

## 2012-06-22 MED ORDER — OXYCODONE HCL 5 MG PO TABS: 15.0000 mg | ORAL_TABLET | ORAL | Status: DC | PRN

## 2012-06-22 MED ORDER — ALUM & MAG HYDROXIDE-SIMETH 200-200-20 MG/5ML PO SUSP: 15.0000 mL | ORAL | Status: DC | PRN

## 2012-06-22 MED ORDER — HYDROMORPHONE HCL PF 2 MG/ML IJ SOLN
2.0000 mg | Freq: Once | INTRAMUSCULAR | Status: AC
  Administered 2012-06-22: 2 mg via INTRAVENOUS
  Filled 2012-06-22: qty 1

## 2012-06-22 MED ORDER — IBUPROFEN 800 MG PO TABS
800.0000 mg | ORAL_TABLET | Freq: Three times a day (TID) | ORAL | Status: DC | PRN
  Administered 2012-06-27: 800 mg via ORAL
  Filled 2012-06-22 (×2): qty 1

## 2012-06-22 MED ORDER — SENNA 8.6 MG PO TABS
1.0000 | ORAL_TABLET | Freq: Two times a day (BID) | ORAL | Status: DC
  Administered 2012-06-22 – 2012-06-27 (×11): 8.6 mg via ORAL
  Filled 2012-06-22 (×12): qty 1

## 2012-06-22 MED ORDER — ONDANSETRON HCL 4 MG/2ML IJ SOLN
4.0000 mg | INTRAMUSCULAR | Status: DC | PRN
  Administered 2012-06-23 – 2012-06-24 (×5): 4 mg via INTRAVENOUS
  Filled 2012-06-22 (×5): qty 2

## 2012-06-22 MED ORDER — ONDANSETRON HCL 4 MG/2ML IJ SOLN
4.0000 mg | Freq: Once | INTRAMUSCULAR | Status: AC
  Administered 2012-06-22: 4 mg via INTRAVENOUS
  Filled 2012-06-22: qty 2

## 2012-06-22 MED ORDER — DIPHENHYDRAMINE HCL 50 MG/ML IJ SOLN: 12.5000 mg | INTRAMUSCULAR | Status: DC | PRN

## 2012-06-22 MED ORDER — HYDROXYUREA 500 MG PO CAPS
1000.0000 mg | ORAL_CAPSULE | Freq: Every day | ORAL | Status: DC
  Administered 2012-06-22 – 2012-06-27 (×6): 1000 mg via ORAL
  Filled 2012-06-22 (×6): qty 2

## 2012-06-22 MED ORDER — HYDROMORPHONE HCL PF 1 MG/ML IJ SOLN
2.0000 mg | INTRAMUSCULAR | Status: DC | PRN
  Administered 2012-06-22 – 2012-06-23 (×4): 4 mg via INTRAVENOUS
  Administered 2012-06-23: 2 mg via INTRAVENOUS
  Administered 2012-06-23: 4 mg via INTRAVENOUS
  Administered 2012-06-23 (×5): 2 mg via INTRAVENOUS
  Administered 2012-06-23 – 2012-06-24 (×4): 4 mg via INTRAVENOUS
  Administered 2012-06-24 – 2012-06-27 (×16): 2 mg via INTRAVENOUS
  Filled 2012-06-22 (×3): qty 4
  Filled 2012-06-22: qty 2
  Filled 2012-06-22: qty 4
  Filled 2012-06-22 (×15): qty 2
  Filled 2012-06-22 (×2): qty 4
  Filled 2012-06-22 (×4): qty 2
  Filled 2012-06-22: qty 4
  Filled 2012-06-22 (×2): qty 2
  Filled 2012-06-22 (×2): qty 4

## 2012-06-22 MED ORDER — IOHEXOL 300 MG/ML  SOLN
100.0000 mL | Freq: Once | INTRAMUSCULAR | Status: AC | PRN
  Administered 2012-06-22: 100 mL via INTRAVENOUS

## 2012-06-22 MED ORDER — DIPHENHYDRAMINE HCL 25 MG PO CAPS
25.0000 mg | ORAL_CAPSULE | ORAL | Status: DC | PRN
  Administered 2012-06-23: 25 mg via ORAL
  Filled 2012-06-22: qty 1

## 2012-06-22 MED ORDER — HYDROCODONE-ACETAMINOPHEN 5-325 MG PO TABS: 1.0000 | ORAL_TABLET | ORAL | Status: DC | PRN

## 2012-06-22 NOTE — H&P (Signed)
[facility] History and Physical  CELESTIA DUVA UYQ:[id number] DOB: Jan 10, 1991 DOA: 06/22/2012  Referring physician:  PCP: Willey Blade, MD  Specialists:   Chief Complaint: Sickle Cell Pain crisis  HPI: [name] is a 22 y.o. female with pmhx includes SS with crisis, GERD, depression, chronic back pain presents to ED with cc moderate to severe right arm and back pain. Information obtained from patient. She reports gradual onset of pain over last 2 days. States pain is typical SS crisis pain that she is accustomed to . Pain constant, ache mostly in right shoulder, arm and wrist. Associated symptoms include abdominal pain with nausea and 2 episodes of vomiting. Reports emesis small amounts clear. No coffee ground like emesis. Reports abdominal pain lower quadrants especially right. Abdominal pain constant, ache, rates 9/10 at worst.  States she took home meds for pain but pain progressively worsened. She denies CP, palpitation, sob, headache, visual disturbances. Denies dysuria, hematuria, constipation, diarrhea, melena. Denies fever, chills, cough sick contacts. Symptoms came on gradually, have persisted worsened. Characterized as severe. In ED she received 4 mg Dilaudid with little relief. Lab work appears close to baseline. We are asked to admit.    Review of Systems: The patient denies anorexia, fever, weight loss,, vision loss, decreased hearing, hoarseness, chest pain, syncope, dyspnea on exertion, peripheral edema, balance deficits, hemoptysis, melena, hematochezia, hematuria, incontinence, genital sores, muscle weakness, suspicious skin lesions, transient blindness, difficulty walking, , unusual weight change, abnormal bleeding, enlarged lymph nodes, angioedema, and breast masses.    Past Medical History  Diagnosis Date  . H/O: 1 miscarriage 03/22/2011  . 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 Depression 01/06/2011  . GERD (gastroesophageal reflux disease) 02/17/2011  . Trichotillomania      h/o  . Blood transfusion     "lots"  . Sickle cell anemia with crisis   . Exertional dyspnea     "sometimes"  . Sickle cell anemia   . Headache   . Migraines 11/08/11    "@ least twice/month"  . Chronic back pain     "very severe; have knot in my back; from tight muscle; take RX and exercise for it"  . Mood swings 11/08/11    "I go back and forth; real bad"  . Pregnancy    Past Surgical History  Procedure Date  . Cholecystectomy 05/2010  . Dilation and curettage of uterus 02/20/11    S/P miscarriage   Social History:  reports that she has never smoked. She has never used smokeless tobacco. She reports that she uses illicit drugs (Marijuana and Other-see comments). She reports that she does not drink alcohol. Pt lives with boyfriend. Independent with ADL's Allergies  Allergen Reactions  . Carrot Oil Hives and Swelling  . Latex Rash  . Toradol (Ketorolac Tromethamine) Hives and Itching    Family History  Problem Relation Age of Onset  . Diabetes Mother      Prior to Admission medications   Medication Sig Start Date End Date Taking? Authorizing Provider  hydroxyurea (HYDREA) 500 MG capsule Take 1,000 mg by mouth daily. May take with food to minimize GI side effects.   Yes Historical Provider, MD  oxyCODONE (ROXICODONE) 15 MG immediate release tablet Take 15 mg by mouth every 4 (four) hours as needed. For pain.   Yes Historical Provider, MD   Physical Exam: Filed Vitals:   06/22/12 0710 06/22/12 0839 06/22/12 1007 06/22/12 1141  BP: 107/62 126/73 102/55 100/48  Pulse: 95 80 88 93  Temp: 98.1  F (36.7 C)  98.5 F (36.9 C)   TempSrc: Oral  Oral   Resp: 18 20 18 18   Height: 5' 2.5" (1.588 m)     Weight: 83.008 kg (183 lb)     SpO2: 98% 99% 100% 99%     General:  Obese, obviously uncomfortable. NAD  Eyes: PERRL, EOMI no scleral icterus false eyelashes  ENT: ears clear, nose without drainage, mucus membrane mouth slightly dry, pink  Neck: supple full rom    Cardiovascular: RRR No MGR No LEE PPP  Respiratory: normal effort BSCTAB no wheeze/rhonchi  Abdomen: obese, soft +BS but sluggish, mild tenderness lower quadrant bilaterally no guarding rebound  Skin: warm/dry no rash/lesion  Musculoskeletal: MAE no joint swelling/erythema. Right shldr/arm/wrist tender to palpation.   Psychiatric: anxious, crying cooperative  Neurologic: cranial nerve II-XII intact speech clear   Labs on Admission:  Basic Metabolic Panel:  Lab 06/22/12 6213  NA 137  K 4.2  CL 101  CO2 24  GLUCOSE 95  BUN 8  CREATININE 0.55  CALCIUM 9.4  MG --  PHOS --   Liver Function Tests: No results found for this basename: AST:5,ALT:5,ALKPHOS:5,BILITOT:5,PROT:5,ALBUMIN:5 in the last 168 hours No results found for this basename: LIPASE:5,AMYLASE:5 in the last 168 hours No results found for this basename: AMMONIA:5 in the last 168 hours CBC:  Lab 06/22/12 0742  WBC 15.6*  NEUTROABS 11.6*  HGB 9.6*  HCT 27.4*  MCV 89.5  PLT 509*   Cardiac Enzymes: No results found for this basename: CKTOTAL:5,CKMB:5,CKMBINDEX:5,TROPONINI:5 in the last 168 hours  BNP (last 3 results)  Basename 11/21/11 1220 10/26/11 0720 10/25/11 0337  PROBNP 16.2 29.9 41.7   CBG: No results found for this basename: GLUCAP:5 in the last 168 hours  Radiological Exams on Admission: No results found.  EKG: Independently reviewed.   Assessment/Plan Active Problems: Sickle cell pain crisis: no obvious trigger. Will admit to tele. In ED Hg 9.6, retic elevated at 17.6. BMET unremarkable. Will aggressively hydrate with 1/2NS at 150/hr. Will use protocol used in past that has proven effective specifically Dilaudid 2-4mg  IV q2hr for pain in addition to home regimen. Will provide oxygen support as needed. Anti-emetic as needed.    SICKLE CELL ANEMIA: see #1.    Nausea & vomiting: likely related to #1. Will provide full liquid diet initially to advance as tolerated. Anti-emetic. Pain med as  described above. If no improvement will consider abdominal imaging.      Depression: at baseline. Monitor    Code Status: full Family Communication:  Disposition Plan: home when ready   Time spent: 60 minutes  [facility] M [facility]   If 7PM-7AM, please contact night-coverage [email or web address] Password [facility] 06/22/2012, 11:55 AM

## 2012-06-22 NOTE — ED Provider Notes (Signed)
History     CSN: [id number]  Arrival date & time 06/22/12  0701   First MD Initiated Contact with Patient 06/22/12 [phone]      Chief Complaint  Patient presents with  . Sickle Cell Pain Crisis    (Consider location/radiation/quality/duration/timing/severity/associated sxs/prior treatment) HPI Pt with history of sickle cell anemia reports onset of moderate to severe aching R arm and back pain pain since last night around 11pm. She also has diffuse stomach pain and vomiting. Denies any CP, SOB or fever. No particular provoking or relieving factors.  Past Medical History  Diagnosis Date  . H/O: 1 miscarriage 03/22/2011  . 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 Depression 01/06/2011  . GERD (gastroesophageal reflux disease) 02/17/2011  . Trichotillomania     h/o  . Blood transfusion     "lots"  . Sickle cell anemia with crisis   . Exertional dyspnea     "sometimes"  . Sickle cell anemia   . Headache   . Migraines 11/08/11    "@ least twice/month"  . Chronic back pain     "very severe; have knot in my back; from tight muscle; take RX and exercise for it"  . Mood swings 11/08/11    "I go back and forth; real bad"  . Pregnancy     Past Surgical History  Procedure Date  . Cholecystectomy 05/2010  . Dilation and curettage of uterus 02/20/11    S/P miscarriage    Family History  Problem Relation Age of Onset  . Diabetes Mother     History  Substance Use Topics  . Smoking status: Never Smoker   . Smokeless tobacco: Never Used  . Alcohol Use: No     Comment: pt states she quit in May    OB History    Grav Para Term Preterm Abortions TAB SAB Ect Mult Living   2    1  1         Obstetric Comments   Miscarried in October 2012 at about 7 weeks      Review of Systems All other systems reviewed and are negative except as noted in HPI.   Allergies  Carrot oil; Latex; and Toradol  Home Medications   Current Outpatient Rx  Name  Route  Sig  Dispense  Refill  . HYDROXYUREA 500  MG PO CAPS   Oral   Take 1,000 mg by mouth daily. May take with food to minimize GI side effects.         . OXYCODONE HCL 15 MG PO TABS   Oral   Take 15 mg by mouth every 4 (four) hours as needed. For pain.           BP 107/62  Pulse 95  Temp 98.1 F (36.7 C) (Oral)  Resp 18  Ht 5' 2.5" (1.588 m)  Wt 183 lb (83.008 kg)  BMI 32.94 kg/m2  SpO2 98%  LMP 06/14/2012  Physical Exam  Nursing note and vitals reviewed. Constitutional: She is oriented to person, place, and time. She appears well-developed and well-nourished.  HENT:  Head: Normocephalic and atraumatic.  Eyes: EOM are normal. Pupils are equal, round, and reactive to light.  Neck: Normal range of motion. Neck supple.  Cardiovascular: Normal rate, normal heart sounds and intact distal pulses.   Pulmonary/Chest: Effort normal and breath sounds normal.  Abdominal: Bowel sounds are normal. She exhibits no distension. There is no tenderness.  Musculoskeletal: Normal range of motion. She exhibits no edema and no tenderness.  Neurological: She is alert and oriented to person, place, and time. She has normal strength. No cranial nerve deficit or sensory deficit.  Skin: Skin is warm and dry. No rash noted.  Psychiatric: She has a normal mood and affect.    ED Course  Procedures (including critical care time)  Labs Reviewed  CBC WITH DIFFERENTIAL - Abnormal; Notable for the following:    WBC 15.6 (*)  WHITE COUNT CONFIRMED ON SMEAR   RBC 3.06 (*)     Hemoglobin 9.6 (*)     HCT 27.4 (*)     RDW 17.8 (*)     Platelets 509 (*)  PLATELET COUNT CONFIRMED BY SMEAR   Neutro Abs 11.6 (*)     Monocytes Absolute 1.6 (*)     All other components within normal limits  RETICULOCYTES - Abnormal; Notable for the following:    Retic Ct Pct 17.6 (*)     RBC. 3.06 (*)     Retic Count, Manual 538.6 (*)     All other components within normal limits  BASIC METABOLIC PANEL  URINALYSIS, ROUTINE W REFLEX MICROSCOPIC  PREGNANCY, URINE    No results found.   No diagnosis found.    MDM  Pt has had no relief in pain despite multiple doses of pain medications. Will admit for further treatment. Awaiting call from Hospitalist.        Bonnita Levan. Bernette Mayers, MD 06/22/12 [phone]

## 2012-06-22 NOTE — H&P (Signed)
Patient seen and examined. Agree with Toya Smothers H and P. Patient relates right  shoulder  pain, right arm pain. Shoulder is warm on palpation. She is also complaining of abdominal pain, right lower quadrant. She is tender on palpation.  Plan: 1-Sickle cell pain crisis: Hb is stable at 9, she has increase bilirubin, increase reticulocyte count, leukocytosis. IV luids, pain medication. Will check shoulder x ray. Chest x ray negative for PNA, UA negative.  2-Abdominal pain: Will check CT scan abdomen-pelvis. NL LFT.

## 2012-06-22 NOTE — ED Notes (Signed)
Report has been called, Admitting MD in the room assessing patient

## 2012-06-22 NOTE — ED Notes (Signed)
Pt reporting sickle cell crisis last night at 11 pm. States aching in right arm and stomach. Moving all extremities. A x 4.

## 2012-06-22 NOTE — ED Notes (Signed)
Pt up ambulatory to the bathroom at this time 

## 2012-06-22 NOTE — ED Notes (Signed)
Admitting NP at bedside

## 2012-06-22 NOTE — ED Notes (Signed)
Pt provided water, has about 250 cc remaining in NS bolus. Pt provided 2 L oxygen for comfort.

## 2012-06-23 DIAGNOSIS — D5700 Hb-SS disease with crisis, unspecified: Principal | ICD-10-CM

## 2012-06-23 DIAGNOSIS — R112 Nausea with vomiting, unspecified: Secondary | ICD-10-CM

## 2012-06-23 DIAGNOSIS — M549 Dorsalgia, unspecified: Secondary | ICD-10-CM

## 2012-06-23 DIAGNOSIS — F17200 Nicotine dependence, unspecified, uncomplicated: Secondary | ICD-10-CM

## 2012-06-23 LAB — CBC WITH DIFFERENTIAL/PLATELET
Basophils Absolute: 0 10*3/uL (ref 0.0–0.1)
Basophils Relative: 0 % (ref 0–1)
Eosinophils Relative: 4 % (ref 0–5)
Lymphocytes Relative: 20 % (ref 12–46)
MCHC: 35.4 g/dL (ref 30.0–36.0)
MCV: 87.4 fL (ref 78.0–100.0)
Monocytes Absolute: 1.7 10*3/uL — ABNORMAL HIGH (ref 0.1–1.0)
Platelets: 439 10*3/uL — ABNORMAL HIGH (ref 150–400)
RDW: 18 % — ABNORMAL HIGH (ref 11.5–15.5)
WBC: 16.2 10*3/uL — ABNORMAL HIGH (ref 4.0–10.5)

## 2012-06-23 LAB — RETICULOCYTES: Retic Ct Pct: 18.8 % — ABNORMAL HIGH (ref 0.4–3.1)

## 2012-06-23 NOTE — Progress Notes (Signed)
[facility] PROGRESS NOTE  [name] JYN:[id number] DOB: 1990/12/06 DOA: 06/22/2012 PCP: August Saucer ERIC, MD  Assessment/Plan:   Sickle cell pain crisis Continue with when necessary IV Dilaudid, oxycodone IR and Vicodin. Continue folic acid and hydroxyurea. Continue with Benadryl and antiemetics. Continue IV hydration. Continue bowel regimen CT of the abdomen unremarkable. 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 Has good range of motion at this time. We'll monitor. advance diet to regular  Leukocytosis Appears chronic. We'll monitor.  Anemia Chronic from sickle cell. At baseline.  Code Status: Full code Family Communication:  None At bedside Disposition Plan: Home once pain controlled   Consultants:  None  Procedures:  None  Antibiotics:  None  HPI/Subjective: Still has  some pain over arms and occasionally  over abdomen.  Objective: Filed Vitals:   06/22/12 1230 06/22/12 2000 06/23/12 0000 06/23/12 0400  BP: 98/59 137/88 128/73 112/66  Pulse: 94 112 115 107  Temp:  97.4 F (36.3 C) 98.3 F (36.8 C) 98.8 F (37.1 C)  TempSrc:  Oral Oral Oral  Resp:  18 18 18   Height:      Weight:    82.872 kg (182 lb 11.2 oz)  SpO2: 97% 96% 98% 95%    Intake/Output Summary (Last 24 hours) at 06/23/12 1146 Last data filed at 06/23/12 0900  Gross per 24 hour  Intake    480 ml  Output      0 ml  Net    480 ml   Filed Weights   06/22/12 0710 06/23/12 0400  Weight: 83.008 kg (183 lb) 82.872 kg (182 lb 11.2 oz)    Exam:   General:  Young female lying in bed in some distress with pain  HEENT: Pallor present, moist oral mucosa, no icterus  Cardiovascular: And S2 tachycardic, no murmurs rub or gallop  Respiratory: clear to auscultation bilaterally no added sounds  Abdomen: , Nontender, nondistended, bowel sounds present  Extremities: Warm, tender to pressure over bilateral arms. Good range of motion of the  extremities  CNS: AAO x3  Data Reviewed: Basic Metabolic Panel:  Recent Labs Lab 06/22/12 0742 06/22/12 1520  NA 137  --   K 4.2  --   CL 101  --   CO2 24  --   GLUCOSE 95  --   BUN 8  --   CREATININE 0.55 0.59  CALCIUM 9.4  --    Liver Function Tests:  Recent Labs Lab 06/22/12 0742  AST 30  ALT 14  ALKPHOS 63  BILITOT 2.5*  PROT 7.6  ALBUMIN 4.1    Recent Labs Lab 06/22/12 0742  LIPASE 34   No results found for this basename: AMMONIA,  in the last 168 hours CBC:  Recent Labs Lab 06/22/12 0742 06/22/12 1520 06/23/12 0825  WBC 15.6* 15.0* 16.2*  NEUTROABS 11.6*  --  10.5*  HGB 9.6* 8.8* 8.1*  HCT 27.4* 25.3* 22.9*  MCV 89.5 88.5 87.4  PLT 509* 509* 439*   Cardiac Enzymes: No results found for this basename: CKTOTAL, CKMB, CKMBINDEX, TROPONINI,  in the last 168 hours BNP (last 3 results)  Recent Labs  10/25/11 0337 10/26/11 0720 11/21/11 1220  PROBNP 41.7 29.9 16.2   CBG: No results found for this basename: GLUCAP,  in the last 168 hours  No results found for this or any previous visit (from the past 240 hour(s)).   Studies: Dg Shoulder Right  06/22/2012  *RADIOLOGY REPORT*  Clinical Data: Right shoulder  pain and sickle cell anemia.  RIGHT SHOULDER - 2+ VIEW  Comparison:  05/06/2011  Findings: There is some sclerosis in the humeral head which appears slightly more prominent compared to the prior study.  Findings may be consistent with osteonecrosis.  No associated humeral head collapse or contour deformity.  No fracture or dislocation is identified.  Soft tissues are unremarkable.  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  No humeral head collapse or deformity is identified.   Original Report Authenticated By: Irish Lack, M.D.    Ct Abdomen Pelvis W Contrast  06/22/2012  *RADIOLOGY REPORT*  Clinical Data: Right lower quadrant abdominal pain  CT ABDOMEN AND PELVIS  WITH CONTRAST  Technique:  Multidetector CT imaging of the abdomen and pelvis was performed following the standard protocol during bolus administration of intravenous contrast.  Contrast: OMNIPAQUE IOHEXOL 300 MG/ML  SOLN  Comparison: 06/21/2011  Findings: Lung bases are clear.  Liver, pancreas, and adrenal glands are within normal limits.  Stable lobular contour of the spleen.  Status post cholecystectomy.  No intrahepatic or extrahepatic ductal dilatation.  Kidneys are within normal limits.  No hydronephrosis.  No evidence of bowel obstruction.  Normal appendix.  No evidence of abdominal aortic aneurysm.  No abdominopelvic ascites.  Small retroperitoneal lymph nodes which do not meet pathologic CT size criteria.  Small right lower quadrant lymph nodes measuring up to 9 mm short axis (series 2/57), chronic.  Uterus and left ovary are unremarkable.  2.6 cm right ovarian cyst / follicle.  Bladder is underdistended.  Visualized thoracolumbar spine is notable for vertebral endplate changes characteristic of sickle cell.  Stable mild sclerosis in the bilateral femoral heads, left greater than right, suggesting early AVN.  IMPRESSION: Normal appendix.  No evidence of bowel obstruction.  2.6 cm right ovarian cyst / follicle.  Otherwise, no CT findings to account for the patient's abdominal pain.   Original Report Authenticated By: Charline Bills, M.D.    Dg Chest Port 1 View  06/22/2012  *RADIOLOGY REPORT*  Clinical Data: Sickle cell crisis.Chest pain.  PORTABLE CHEST - 1 VIEW  Comparison: 03/08/2012  Findings: Bilateral humeral head infarcts suspected. Midline trachea.  Borderline cardiomegaly.  Removal of left PICC line. No pleural effusion or pneumothorax.  Minimal right upper lobe scarring.  Left lung clear.  IMPRESSION: No acute cardiopulmonary disease.   Original Report Authenticated By: Jeronimo Greaves, M.D.     Scheduled Meds: . enoxaparin (LOVENOX) injection  40 mg Subcutaneous Q24H  . folic acid  1 mg  Oral Daily  . hydroxyurea  1,000 mg Oral Daily  . senna  1 tablet Oral BID   Continuous Infusions: . sodium chloride 150 mL/hr at 06/23/12 0315    Active Problems:   SICKLE CELL ANEMIA   Back pain   Depression   Nausea & vomiting   Sickle cell pain crisis    Time spent: 25 minutes    Brigg Cape  [facility] Pager [phone]. If 8PM-8AM, please contact night-coverage at [email or web address], password [facility] 06/23/2012, 11:46 AM  LOS: 1 day

## 2012-06-24 LAB — COMPREHENSIVE METABOLIC PANEL
ALT: 15 U/L (ref 0–35)
BUN: 6 mg/dL (ref 6–23)
CO2: 26 mEq/L (ref 19–32)
Calcium: 9.2 mg/dL (ref 8.4–10.5)
Creatinine, Ser: 0.56 mg/dL (ref 0.50–1.10)
GFR calc Af Amer: >90 mL/min (ref >90)
GFR calc non Af Amer: >90 mL/min (ref >90)
Glucose, Bld: 108 mg/dL — ABNORMAL HIGH (ref 70–99)
Sodium: 137 mEq/L (ref 135–145)
Total Protein: 7.3 g/dL (ref 6.0–8.3)

## 2012-06-24 MED ORDER — PROMETHAZINE HCL 25 MG/ML IJ SOLN
25.0000 mg | Freq: Four times a day (QID) | INTRAMUSCULAR | Status: DC | PRN
  Administered 2012-06-24: 25 mg via INTRAVENOUS
  Filled 2012-06-24: qty 1

## 2012-06-24 MED ORDER — PROMETHAZINE HCL 25 MG/ML IJ SOLN
12.5000 mg | Freq: Once | INTRAMUSCULAR | Status: AC
  Administered 2012-06-24: 12.5 mg via INTRAVENOUS
  Filled 2012-06-24: qty 1

## 2012-06-24 MED ORDER — OXYCODONE HCL ER 15 MG PO T12A
15.0000 mg | EXTENDED_RELEASE_TABLET | Freq: Two times a day (BID) | ORAL | Status: DC
  Administered 2012-06-24 – 2012-06-26 (×5): 15 mg via ORAL
  Filled 2012-06-24 (×5): qty 1

## 2012-06-24 NOTE — Progress Notes (Signed)
[facility] PROGRESS NOTE  [name] JXB:[id number] DOB: 1990/09/04 DOA: 06/22/2012 PCP: August Saucer ERIC, MD  Assessment/Plan:  Sickle cell pain crisis  Continue with when necessary IV Dilaudid, oxycodone IR and Vicodin. Trying to make dilaudid more frequent but patient having nausea and says having difficulty tolerating po.  Continue folic acid and hydroxyurea.  Continue with Benadryl and antiemetics. Add phenergan. Add long acting oxycodone 15 mg bid Continue IV hydration.  Continue bowel regimen  CT of the abdomen unremarkable. 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 Has good range of motion at this time. We'll monitor.    Leukocytosis  Appears chronic. We'll monitor.   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 At baseline.   Code Status: Full code  Family Communication: None At bedside  Disposition Plan: Home once pain controlled  Consultants:  None Procedures:  None Antibiotics:  None HPI/Subjective:  Still has pain over back and arms . Still nauseous with few episode of vomiting overnight ( not witnessed)   Objective: Filed Vitals:   06/23/12 0400 06/23/12 1420 06/23/12 2100 06/24/12 0600  BP: 112/66 114/64 144/70 105/52  Pulse: 107 117 107 104  Temp: 98.8 F (37.1 C) 98.1 F (36.7 C) 98.7 F (37.1 C) 98.5 F (36.9 C)  TempSrc: Oral Oral Oral Oral  Resp: 18 20 18 18   Height:      Weight: 82.872 kg (182 lb 11.2 oz)   82.7 kg (182 lb 5.1 oz)  SpO2: 95% 94% 97% 96%    Intake/Output Summary (Last 24 hours) at 06/24/12 1251 Last data filed at 06/23/12 1800  Gross per 24 hour  Intake   2252 ml  Output      0 ml  Net   2252 ml   Filed Weights   06/22/12 0710 06/23/12 0400 06/24/12 0600  Weight: 83.008 kg (183 lb) 82.872 kg (182 lb 11.2 oz) 82.7 kg (182 lb 5.1 oz)    Exam:  General: Young female lying in bed in some distress with pain  HEENT: Pallor present, moist oral mucosa, no icterus  Cardiovascular: And S2 tachycardic,  no murmurs rub or gallop  Respiratory: clear to auscultation bilaterally no added sounds  Abdomen: , Nontender, nondistended, bowel sounds present  Extremities: Warm, tender to pressure over bilateral arms. Good range of motion of the extremities  CNS: AAO x3   Data Reviewed: Basic Metabolic Panel:  Recent Labs Lab 06/22/12 0742 06/22/12 1520  NA 137  --   K 4.2  --   CL 101  --   CO2 24  --   GLUCOSE 95  --   BUN 8  --   CREATININE 0.55 0.59  CALCIUM 9.4  --    Liver Function Tests:  Recent Labs Lab 06/22/12 0742  AST 30  ALT 14  ALKPHOS 63  BILITOT 2.5*  PROT 7.6  ALBUMIN 4.1    Recent Labs Lab 06/22/12 0742  LIPASE 34   No results found for this basename: AMMONIA,  in the last 168 hours CBC:  Recent Labs Lab 06/22/12 0742 06/22/12 1520 06/23/12 0825  WBC 15.6* 15.0* 16.2*  NEUTROABS 11.6*  --  10.5*  HGB 9.6* 8.8* 8.1*  HCT 27.4* 25.3* 22.9*  MCV 89.5 88.5 87.4  PLT 509* 509* 439*   Cardiac Enzymes: No results found for this basename: CKTOTAL, CKMB, CKMBINDEX, TROPONINI,  in the last 168 hours BNP (last 3 results)  Recent Labs  10/25/11 0337 10/26/11 0720 11/21/11 1220  PROBNP  41.7 29.9 16.2   CBG: No results found for this basename: GLUCAP,  in the last 168 hours  No results found for this or any previous visit (from the past 240 hour(s)).   Studies: Dg Shoulder Right  06/22/2012  *RADIOLOGY REPORT*  Clinical Data: Right shoulder pain and sickle cell anemia.  RIGHT SHOULDER - 2+ VIEW  Comparison:  05/06/2011  Findings: There is some sclerosis in the humeral head which appears slightly more prominent compared to the prior study.  Findings may be consistent with osteonecrosis.  No associated humeral head collapse or contour deformity.  No fracture or dislocation is identified.  Soft tissues are unremarkable.  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  No humeral  head collapse or deformity is identified.   Original Report Authenticated By: Irish Lack, M.D.    Ct Abdomen Pelvis W Contrast  06/22/2012  *RADIOLOGY REPORT*  Clinical Data: Right lower quadrant abdominal pain  CT ABDOMEN AND PELVIS WITH CONTRAST  Technique:  Multidetector CT imaging of the abdomen and pelvis was performed following the standard protocol during bolus administration of intravenous contrast.  Contrast: OMNIPAQUE IOHEXOL 300 MG/ML  SOLN  Comparison: 06/21/2011  Findings: Lung bases are clear.  Liver, pancreas, and adrenal glands are within normal limits.  Stable lobular contour of the spleen.  Status post cholecystectomy.  No intrahepatic or extrahepatic ductal dilatation.  Kidneys are within normal limits.  No hydronephrosis.  No evidence of bowel obstruction.  Normal appendix.  No evidence of abdominal aortic aneurysm.  No abdominopelvic ascites.  Small retroperitoneal lymph nodes which do not meet pathologic CT size criteria.  Small right lower quadrant lymph nodes measuring up to 9 mm short axis (series 2/57), chronic.  Uterus and left ovary are unremarkable.  2.6 cm right ovarian cyst / follicle.  Bladder is underdistended.  Visualized thoracolumbar spine is notable for vertebral endplate changes characteristic of sickle cell.  Stable mild sclerosis in the bilateral femoral heads, left greater than right, suggesting early AVN.  IMPRESSION: Normal appendix.  No evidence of bowel obstruction.  2.6 cm right ovarian cyst / follicle.  Otherwise, no CT findings to account for the patient's abdominal pain.   Original Report Authenticated By: Charline Bills, M.D.    Dg Chest Port 1 View  06/22/2012  *RADIOLOGY REPORT*  Clinical Data: Sickle cell crisis.Chest pain.  PORTABLE CHEST - 1 VIEW  Comparison: 03/08/2012  Findings: Bilateral humeral head infarcts suspected. Midline trachea.  Borderline cardiomegaly.  Removal of left PICC line. No pleural effusion or pneumothorax.  Minimal right  upper lobe scarring.  Left lung clear.  IMPRESSION: No acute cardiopulmonary disease.   Original Report Authenticated By: Jeronimo Greaves, M.D.     Scheduled Meds: . enoxaparin (LOVENOX) injection  40 mg Subcutaneous Q24H  . folic acid  1 mg Oral Daily  . hydroxyurea  1,000 mg Oral Daily  . OxyCODONE  15 mg Oral Q12H  . senna  1 tablet Oral BID   Continuous Infusions: . sodium chloride 150 mL/hr at 06/23/12 2314      Time spent: 25 minutes    Eddie North  [facility] Pager [phone]. If 8PM-8AM, please contact night-coverage at [email or web address], password [facility] 06/24/2012, 12:51 PM  LOS: 2 days

## 2012-06-25 ENCOUNTER — Encounter (HOSPITAL_COMMUNITY): Payer: Self-pay | Admitting: General Practice

## 2012-06-25 DIAGNOSIS — F329 Major depressive disorder, single episode, unspecified: Secondary | ICD-10-CM

## 2012-06-25 LAB — CBC
HCT: 22 % — ABNORMAL LOW (ref 36.0–46.0)
Hemoglobin: 7.9 g/dL — ABNORMAL LOW (ref 12.0–15.0)
MCHC: 35.9 g/dL (ref 30.0–36.0)
RBC: 2.46 MIL/uL — ABNORMAL LOW (ref 3.87–5.11)
WBC: 19.1 10*3/uL — ABNORMAL HIGH (ref 4.0–10.5)

## 2012-06-25 NOTE — Progress Notes (Signed)
[facility] PROGRESS NOTE  ARBADELLA KIMBLER [name]:[id number] DOB: 1991/05/05 DOA: 06/22/2012 PCP: August Saucer ERIC, MD  Assessment/Plan:  Sickle cell pain crisis  Continue with when necessary IV Dilaudid, oxycodone IR and Vicodin.  Continue folic acid and hydroxyurea.  Continue with Benadryl and antiemetics On  long acting oxycodone 15 mg bid Continue IV hydration.  Continue bowel regimen  CT of the abdomen unremarkable. 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 Has good range of motion at this time. We'll monitor.    Leukocytosis  Appears chronic. We'll monitor.   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 At baseline.   Code Status: Full code  Family Communication: None At bedside  Disposition Plan: Home once pain controlled   HPI/Subjective:  Complains of pain in the jaw, arms   Objective: Filed Vitals:   06/24/12 1400 06/24/12 2100 06/25/12 0650 06/25/12 1400  BP: 112/73 114/75 94/56 104/63  Pulse: 69 113 98 103  Temp: 98.4 F (36.9 C) 99.2 F (37.3 C) 98.5 F (36.9 C) 98 F (36.7 C)  TempSrc: Oral Oral Oral   Resp: 20 20 20 20   Height:      Weight:      SpO2: 95% 93% 95% 92%    Intake/Output Summary (Last 24 hours) at 06/25/12 1613 Last data filed at 06/25/12 0700  Gross per 24 hour  Intake   1800 ml  Output      0 ml  Net   1800 ml   Filed Weights   06/22/12 0710 06/23/12 0400 06/24/12 0600  Weight: 83.008 kg (183 lb) 82.872 kg (182 lb 11.2 oz) 82.7 kg (182 lb 5.1 oz)    Exam:  General: Young female lying in bed in some distress with pain  HEENT: Pallor present, moist oral mucosa, no icterus  Cardiovascular: And S2 tachycardic, no murmurs rub or gallop  Respiratory: clear to auscultation bilaterally no added sounds  Abdomen: , Nontender, nondistended, bowel sounds present  Extremities: Warm, tender to pressure over bilateral arms. Good range of motion of the extremities    Data Reviewed: Basic Metabolic Panel:  Recent  Labs Lab 06/22/12 0742 06/22/12 1520 06/24/12 1320  NA 137  --  137  K 4.2  --  4.0  CL 101  --  102  CO2 24  --  26  GLUCOSE 95  --  108*  BUN 8  --  6  CREATININE 0.55 0.59 0.56  CALCIUM 9.4  --  9.2   Liver Function Tests:  Recent Labs Lab 06/22/12 0742 06/24/12 1320  AST 30 28  ALT 14 15  ALKPHOS 63 57  BILITOT 2.5* 4.5*  PROT 7.6 7.3  ALBUMIN 4.1 3.9    Recent Labs Lab 06/22/12 0742  LIPASE 34   No results found for this basename: AMMONIA,  in the last 168 hours CBC:  Recent Labs Lab 06/22/12 0742 06/22/12 1520 06/23/12 0825 06/25/12 1038  WBC 15.6* 15.0* 16.2* 19.1*  NEUTROABS 11.6*  --  10.5*  --   HGB 9.6* 8.8* 8.1* 7.9*  HCT 27.4* 25.3* 22.9* 22.0*  MCV 89.5 88.5 87.4 89.4  PLT 509* 509* 439* 448*   Cardiac Enzymes: No results found for this basename: CKTOTAL, CKMB, CKMBINDEX, TROPONINI,  in the last 168 hours BNP (last 3 results)  Recent Labs  10/25/11 0337 10/26/11 0720 11/21/11 1220  PROBNP 41.7 29.9 16.2   CBG: No results found for this basename: GLUCAP,  in the last 168 hours  No results  found for this or any previous visit (from the past 240 hour(s)).   Studies: No results found.  Scheduled Meds: . enoxaparin (LOVENOX) injection  40 mg Subcutaneous Q24H  . folic acid  1 mg Oral Daily  . hydroxyurea  1,000 mg Oral Daily  . OxyCODONE  15 mg Oral Q12H  . senna  1 tablet Oral BID   Continuous Infusions: . sodium chloride 50 mL/hr at 06/25/12 1557        Rajveer Handler  [facility] Pager [phone]. If 8PM-8AM, please contact night-coverage at [email or web address], password [facility] 06/25/2012, 4:13 PM  LOS: 3 days

## 2012-06-25 NOTE — Care Management (Signed)
CARE MANAGE MENT UTILIZATION REVIEW NOTE 06/25/2012     Patient:  [name], [name]   Account Number:  [id number]  Documented by:  Roxy Manns Kam Kushnir   Per Ur Regulation Reviewed for med. necessity/level of care/duration of stay

## 2012-06-26 DIAGNOSIS — K219 Gastro-esophageal reflux disease without esophagitis: Secondary | ICD-10-CM

## 2012-06-26 LAB — CBC
HCT: 23.4 % — ABNORMAL LOW (ref 36.0–46.0)
Hemoglobin: 8 g/dL — ABNORMAL LOW (ref 12.0–15.0)
MCH: 31.7 pg (ref 26.0–34.0)
MCV: 92.9 fL (ref 78.0–100.0)
Platelets: ADEQUATE 10*3/uL (ref 150–400)
RBC: 2.52 MIL/uL — ABNORMAL LOW (ref 3.87–5.11)
WBC: 19.3 10*3/uL — ABNORMAL HIGH (ref 4.0–10.5)

## 2012-06-26 MED ORDER — POLYETHYLENE GLYCOL 3350 17 G PO PACK
68.0000 g | PACK | Freq: Once | ORAL | Status: AC
  Administered 2012-06-26: 68 g via ORAL
  Filled 2012-06-26: qty 4

## 2012-06-26 NOTE — Progress Notes (Signed)
[facility] PROGRESS NOTE  [name] ZOX:[id number] DOB: [birth year] DOA: 06/22/2012 PCP: August Saucer ERIC, MD  Assessment/Plan:  Sickle cell pain crisis  Continue with when necessary IV Dilaudid, oxycodone IR and Vicodin.  Continue folic acid and hydroxyurea.  Continue with Benadryl and antiemetics Continue IV hydration.  CT of the abdomen unremarkable. 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 Has good range of motion at this time. We'll monitor.    Severe constipation  Miralax bowel prep ordered 06/26/12   Leukocytosis  Appears chronic. We'll monitor.   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 At baseline.   Code Status: Full code  Family Communication: None At bedside  Disposition Plan: Home once pain controlled   HPI/Subjective:  Complains of pain in the arms and right side of the body    Objective: Filed Vitals:   06/25/12 1400 06/25/12 2100 06/26/12 0532 06/26/12 1400  BP: 104/63 112/71 110/75 122/81  Pulse: 103 107 89 98  Temp: 98 F (36.7 C) 99.5 F (37.5 C) 99.8 F (37.7 C) 99.2 F (37.3 C)  TempSrc:  Oral Oral   Resp: 20 20 20 20   Height:      Weight:      SpO2: 92% 96%  95%    Intake/Output Summary (Last 24 hours) at 06/26/12 1724 Last data filed at 06/26/12 0700  Gross per 24 hour  Intake 1746.66 ml  Output      0 ml  Net 1746.66 ml   Filed Weights   06/22/12 0710 06/23/12 0400 06/24/12 0600  Weight: 83.008 kg (183 lb) 82.872 kg (182 lb 11.2 oz) 82.7 kg (182 lb 5.1 oz)    Exam:  General: Young female lying in bed in some distress with pain  HEENT: Pallor present, moist oral mucosa, no icterus  Cardiovascular: And S2 tachycardic, no murmurs rub or gallop  Respiratory: clear to auscultation bilaterally no added sounds  Abdomen: , Nontender, nondistended, bowel sounds present  Extremities: Warm, tender to pressure over bilateral arms. Good range of motion of the extremities    Data Reviewed: Basic Metabolic  Panel:  Recent Labs Lab 06/22/12 0742 06/22/12 1520 06/24/12 1320  NA 137  --  137  K 4.2  --  4.0  CL 101  --  102  CO2 24  --  26  GLUCOSE 95  --  108*  BUN 8  --  6  CREATININE 0.55 0.59 0.56  CALCIUM 9.4  --  9.2   Liver Function Tests:  Recent Labs Lab 06/22/12 0742 06/24/12 1320  AST 30 28  ALT 14 15  ALKPHOS 63 57  BILITOT 2.5* 4.5*  PROT 7.6 7.3  ALBUMIN 4.1 3.9    Recent Labs Lab 06/22/12 0742  LIPASE 34   No results found for this basename: AMMONIA,  in the last 168 hours CBC:  Recent Labs Lab 06/22/12 0742 06/22/12 1520 06/23/12 0825 06/25/12 1038 06/26/12 0533  WBC 15.6* 15.0* 16.2* 19.1* 19.3*  NEUTROABS 11.6*  --  10.5*  --   --   HGB 9.6* 8.8* 8.1* 7.9* 8.0*  HCT 27.4* 25.3* 22.9* 22.0* 23.4*  MCV 89.5 88.5 87.4 89.4 92.9  PLT 509* 509* 439* 448* PLATELET CLUMPS NOTED ON SMEAR, COUNT APPEARS ADEQUATE   Cardiac Enzymes: No results found for this basename: CKTOTAL, CKMB, CKMBINDEX, TROPONINI,  in the last 168 hours BNP (last 3 results)  Recent Labs  10/25/11 0337 10/26/11 0720 11/21/11 1220  PROBNP 41.7 29.9 16.2  CBG: No results found for this basename: GLUCAP,  in the last 168 hours  No results found for this or any previous visit (from the past 240 hour(s)).   Studies: No results found.  Scheduled Meds: . enoxaparin (LOVENOX) injection  40 mg Subcutaneous Q24H  . folic acid  1 mg Oral Daily  . hydroxyurea  1,000 mg Oral Daily  . polyethylene glycol  68 g Oral Once  . senna  1 tablet Oral BID   Continuous Infusions: . sodium chloride 50 mL/hr at 06/25/12 1557        [name]  [facility] Pager [phone]. If 8PM-8AM, please contact night-coverage at [email or web address], password [facility] 06/26/2012, 5:24 PM  LOS: 4 days

## 2012-06-27 LAB — CBC
HCT: 23.3 % — ABNORMAL LOW (ref 36.0–46.0)
Hemoglobin: 7.9 g/dL — ABNORMAL LOW (ref 12.0–15.0)
MCHC: 33.9 g/dL (ref 30.0–36.0)
MCV: 92.8 fL (ref 78.0–100.0)
RDW: 23.3 % — ABNORMAL HIGH (ref 11.5–15.5)

## 2012-06-27 IMAGING — CR DG LUMBAR SPINE 2-3V
3 series · 3 of 3 positions shown · non-contrast
Comparison: CT abdomen and pelvis 06/21/2011

CLINICAL DATA: Low back pain and difficulty walking.  Sickle cell
crisis.

LUMBAR SPINE - 2-3 VIEW

[t l-spine lat]
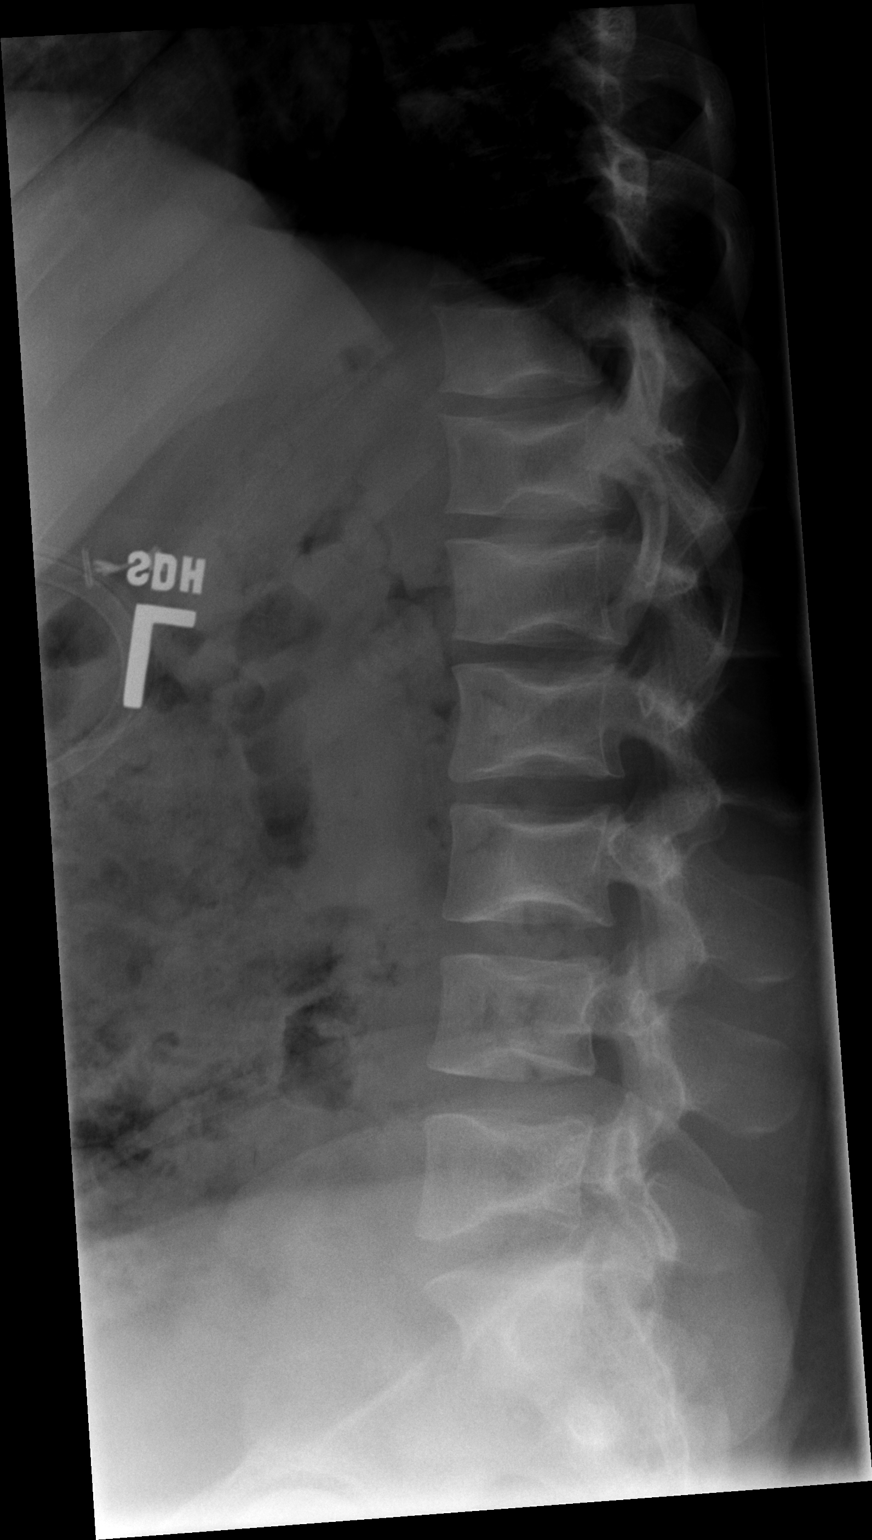

[t l-spine lat *]
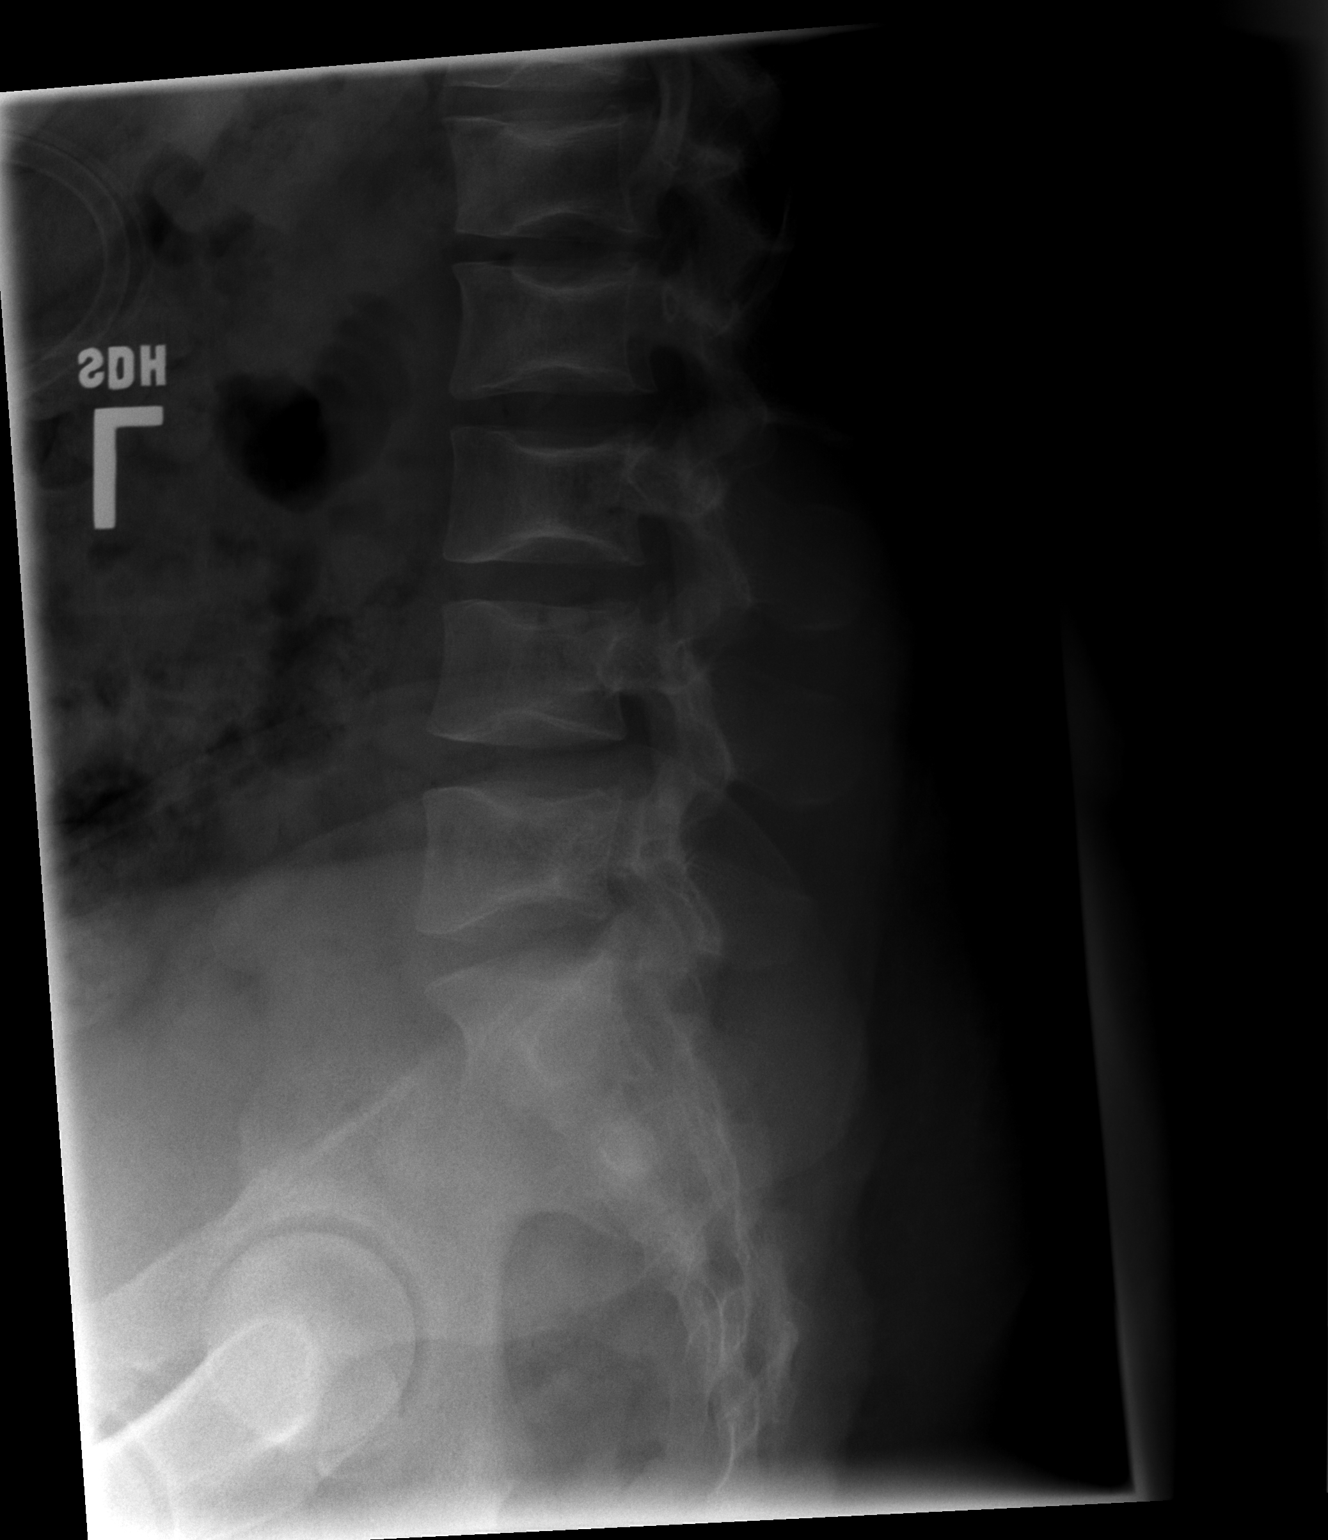

[t l-spine a.p.]
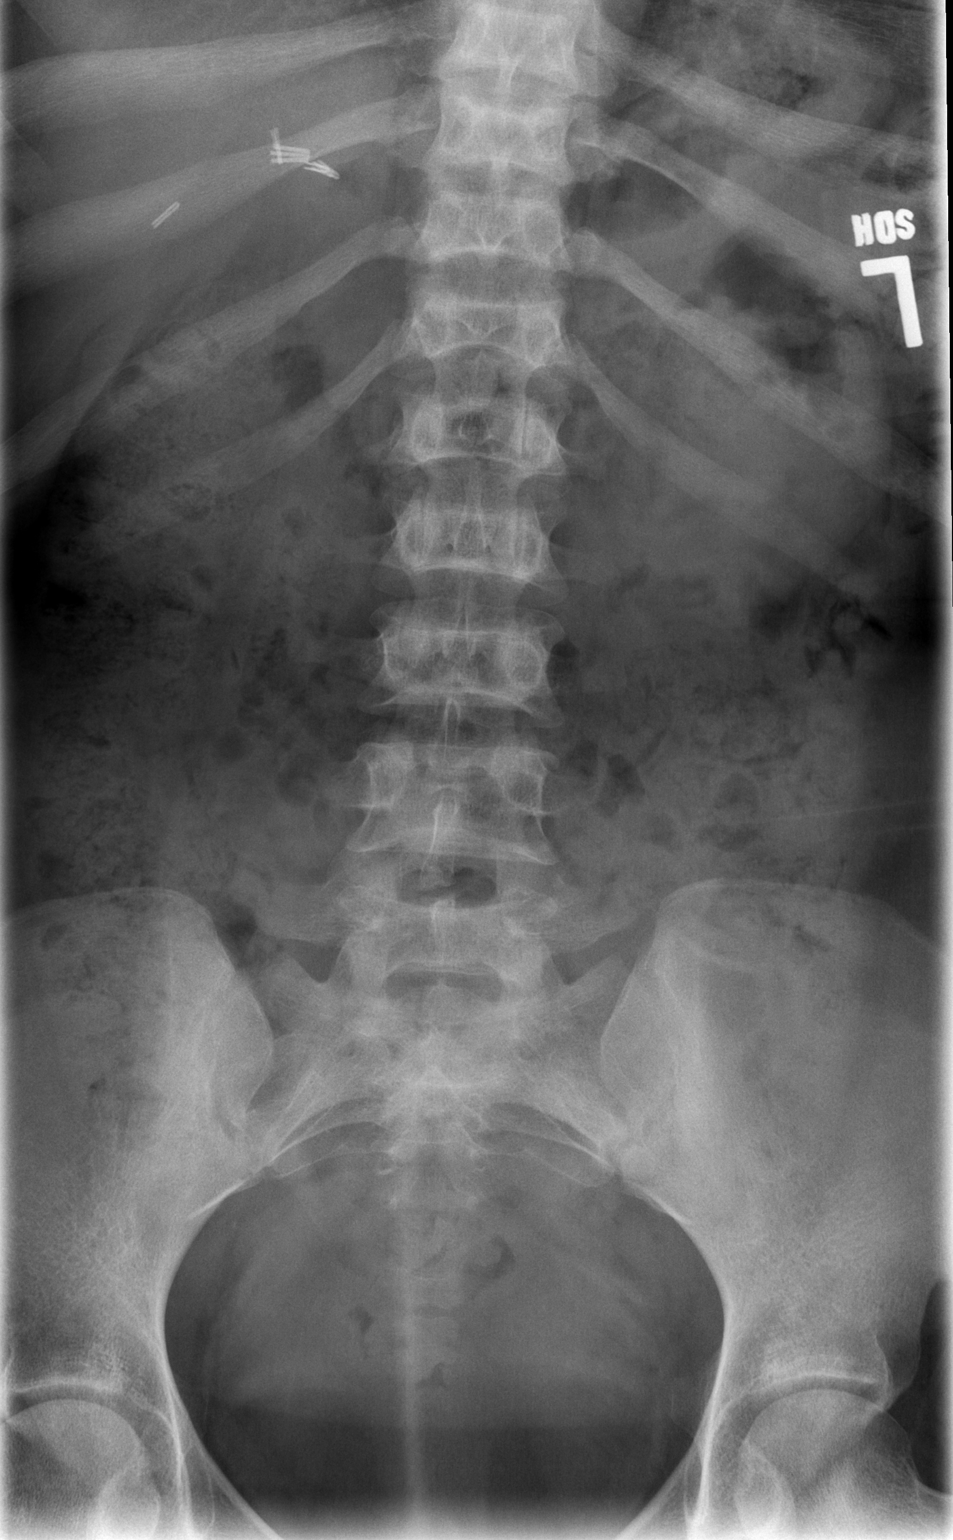

[3 of 3 positions shown; findings below may reference images not displayed]

FINDINGS: Five lumbar type vertebra.  Mild coarsening of vertebral
trabecular architecture and central endplate compression
deformities consistent with history of sickle cell disease.  Normal
alignment of the lumbar vertebrae.  No significant changes since
previous study, allowing for differences in the technique.
IMPRESSION: Bone changes consistent with sickle cell.  No displaced fractures
identified.

## 2012-06-27 MED ORDER — SENNA 8.6 MG PO TABS: 1.0000 | ORAL_TABLET | Freq: Two times a day (BID) | ORAL | Status: DC

## 2012-06-27 MED ORDER — HYDROMORPHONE HCL PF 1 MG/ML IJ SOLN
2.0000 mg | Freq: Once | INTRAMUSCULAR | Status: AC
  Administered 2012-06-27: 2 mg via INTRAVENOUS
  Filled 2012-06-27: qty 2

## 2012-06-27 NOTE — Progress Notes (Signed)
Informed by Nurse tech inability to arouse patient during vitals.  Called patient by name several times and shook patient's arm to attempt to wake patient up.  Unable to wake pt.  VSS.  After 5 minutes patient arousable and alert and oriented to baseline.  While leaving room noted patient purse to be open and noticed prescription bottle of oxycodone with a fill date of 06/14/12 sitting on top of purse.  Pt. States she had a total of two bottles of oxycodone.  Noted both bottles to be empty. Asked patient about this and patient stated "I did not take any of the oxycodone. I put the pills in an old bottle and only carry a few with me when I am out and about."   Pt. Also states "I am always difficult to wake up in the morning."  Pt. Requesting IV dialudid.  Night hospitalist notified.  Order received to give advil only at 0520.  Attempted to give advil at 0530 and patient asleep at this time.  Pt. Not woken up for medication.  Pt. Called RN at [phone] and requested pain medication again.  advil given per prn order.  Will continue to monitor.

## 2012-06-27 NOTE — Discharge Summary (Signed)
Physician Discharge Summary  [name] ZOX:[id number] DOB: [birth year] DOA: 06/22/2012  PCP: Willey Blade, MD  Admit date: 06/22/2012 Discharge date: 06/27/2012  Time spent: 55 minutes   Discharge Diagnoses:     SICKLE CELL ANEMIA   Back pain   Depression   Nausea & vomiting   Sickle cell pain crisis Constipation   Discharge Condition: good  Diet recommendation: regular   Filed Weights   06/23/12 0400 06/24/12 0600 06/27/12 0535  Weight: 82.872 kg (182 lb 11.2 oz) 82.7 kg (182 lb 5.1 oz) 82.509 kg (181 lb 14.4 oz)    History of present illness:  [name] is a 22 y.o. female with pmhx includes SS with crisis, GERD, depression, chronic back pain presents to ED with cc moderate to severe right arm and back pain. Information obtained from patient. She reports gradual onset of pain over last 2 days. States pain is typical SS crisis pain that she is accustomed to . Pain constant, ache mostly in right shoulder, arm and wrist. Associated symptoms include abdominal pain with nausea and 2 episodes of vomiting. Reports emesis small amounts clear. No coffee ground like emesis. Reports abdominal pain lower quadrants especially right. Abdominal pain constant, ache, rates 9/10 at worst. States she took home meds for pain but pain progressively worsened. She denies CP, palpitation, sob, headache, visual disturbances. Denies dysuria, hematuria, constipation, diarrhea, melena. Denies fever, chills, cough sick contacts. Symptoms came on gradually, have persisted worsened. Characterized as severe. In ED she received 4 mg Dilaudid with little relief. Lab work appears close to baseline. We are asked to admit   Hospital Course:  1. Sickle cell crisis - patient was admitted with sickle cell crisis. She mainly had symptoms in her bones and joints. We did not suspect an acute chest syndrome.. the patient was started on half normal saline at 150 cc an hour and she underwent CT scan of abdomen which was  unremarkable. X-ray of the right shoulder shows osteonecrosis. We're able to control the pain with intravenous analgesics and patient was transitioned to oral oxycodone. The patient was continued on folic acid and hydroxyurea throughout admission. Patient will follow up with her primary care physician next week as previously scheduled 2. severe constipation-probably related to the opiates. The patient responded to MiraLAX bowel prep 3. leukocytosis-chronic-reverted back to baseline prior to discharge  Procedures: None  Consultations:  none  Discharge Exam: Filed Vitals:   06/25/12 2100 06/26/12 0532 06/26/12 1400 06/27/12 0535  BP: 112/71 110/75 122/81 116/70  Pulse: 107 89 98 98  Temp: 99.5 F (37.5 C) 99.8 F (37.7 C) 99.2 F (37.3 C) 98.5 F (36.9 C)  TempSrc: Oral Oral  Oral  Resp: 20 20 20 17   Height:      Weight:    82.509 kg (181 lb 14.4 oz)  SpO2: 96%  95% 95%    General: axox3 Cardiovascular: rrr Respiratory: ctab   Discharge Instructions  Discharge Orders   Future Orders Complete By Expires     Diet general  As directed     Increase activity slowly  As directed         Medication List    TAKE these medications       hydroxyurea 500 MG capsule  Commonly known as:  HYDREA  Take 1,000 mg by mouth daily. May take with food to minimize GI side effects.     oxyCODONE 15 MG immediate release tablet  Commonly known as:  ROXICODONE  Take 15  mg by mouth every 4 (four) hours as needed. For pain.     senna 8.6 MG Tabs  Commonly known as:  SENOKOT  Take 1 tablet (8.6 mg total) by mouth 2 (two) times daily.           Follow-up Information   Follow up with August Saucer, ERIC, MD. (as previously scheduled)    Contact information:   [address] [place] [address] [phone]        The results of significant diagnostics from this hospitalization (including imaging, microbiology, ancillary and laboratory) are listed below for reference.     Significant Diagnostic Studies: Dg Shoulder Right  06/22/2012  *RADIOLOGY REPORT*  Clinical Data: Right shoulder pain and sickle cell anemia.  RIGHT SHOULDER - 2+ VIEW  Comparison:  05/06/2011  Findings: There is some sclerosis in the humeral head which appears slightly more prominent compared to the prior study.  Findings may be consistent with osteonecrosis.  No associated humeral head collapse or contour deformity.  No fracture or dislocation is identified.  Soft tissues are unremarkable.  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  No humeral head collapse or deformity is identified.   Original Report Authenticated By: Irish Lack, M.D.    Ct Abdomen Pelvis W Contrast  06/22/2012  *RADIOLOGY REPORT*  Clinical Data: Right lower quadrant abdominal pain  CT ABDOMEN AND PELVIS WITH CONTRAST  Technique:  Multidetector CT imaging of the abdomen and pelvis was performed following the standard protocol during bolus administration of intravenous contrast.  Contrast: OMNIPAQUE IOHEXOL 300 MG/ML  SOLN  Comparison: 06/21/2011  Findings: Lung bases are clear.  Liver, pancreas, and adrenal glands are within normal limits.  Stable lobular contour of the spleen.  Status post cholecystectomy.  No intrahepatic or extrahepatic ductal dilatation.  Kidneys are within normal limits.  No hydronephrosis.  No evidence of bowel obstruction.  Normal appendix.  No evidence of abdominal aortic aneurysm.  No abdominopelvic ascites.  Small retroperitoneal lymph nodes which do not meet pathologic CT size criteria.  Small right lower quadrant lymph nodes measuring up to 9 mm short axis (series 2/57), chronic.  Uterus and left ovary are unremarkable.  2.6 cm right ovarian cyst / follicle.  Bladder is underdistended.  Visualized thoracolumbar spine is notable for vertebral endplate changes characteristic of sickle cell.  Stable mild sclerosis in the  bilateral femoral heads, left greater than right, suggesting early AVN.  IMPRESSION: Normal appendix.  No evidence of bowel obstruction.  2.6 cm right ovarian cyst / follicle.  Otherwise, no CT findings to account for the patient's abdominal pain.   Original Report Authenticated By: Charline Bills, M.D.    Dg Chest Port 1 View  06/22/2012  *RADIOLOGY REPORT*  Clinical Data: Sickle cell crisis.Chest pain.  PORTABLE CHEST - 1 VIEW  Comparison: 03/08/2012  Findings: Bilateral humeral head infarcts suspected. Midline trachea.  Borderline cardiomegaly.  Removal of left PICC line. No pleural effusion or pneumothorax.  Minimal right upper lobe scarring.  Left lung clear.  IMPRESSION: No acute cardiopulmonary disease.   Original Report Authenticated By: Jeronimo Greaves, M.D.     Microbiology: No results found for this or any previous visit (from the past 240 hour(s)).   Labs: Basic Metabolic Panel:  Recent Labs Lab 06/22/12 0742 06/22/12 1520 06/24/12 1320  NA 137  --  137  K 4.2  --  4.0  CL 101  --  102  CO2 24  --  26  GLUCOSE 95  --  108*  BUN 8  --  6  CREATININE 0.55 0.59 0.56  CALCIUM 9.4  --  9.2   Liver Function Tests:  Recent Labs Lab 06/22/12 0742 06/24/12 1320  AST 30 28  ALT 14 15  ALKPHOS 63 57  BILITOT 2.5* 4.5*  PROT 7.6 7.3  ALBUMIN 4.1 3.9    Recent Labs Lab 06/22/12 0742  LIPASE 34   No results found for this basename: AMMONIA,  in the last 168 hours CBC:  Recent Labs Lab 06/22/12 0742 06/22/12 1520 06/23/12 0825 06/25/12 1038 06/26/12 0533 06/27/12 0916  WBC 15.6* 15.0* 16.2* 19.1* 19.3* 15.3*  NEUTROABS 11.6*  --  10.5*  --   --   --   HGB 9.6* 8.8* 8.1* 7.9* 8.0* 7.9*  HCT 27.4* 25.3* 22.9* 22.0* 23.4* 23.3*  MCV 89.5 88.5 87.4 89.4 92.9 92.8  PLT 509* 509* 439* 448* PLATELET CLUMPS NOTED ON SMEAR, COUNT APPEARS ADEQUATE 461*   Cardiac Enzymes: No results found for this basename: CKTOTAL, CKMB, CKMBINDEX, TROPONINI,  in the last 168  hours BNP: BNP (last 3 results)  Recent Labs  10/25/11 0337 10/26/11 0720 11/21/11 1220  PROBNP 41.7 29.9 16.2   CBG: No results found for this basename: GLUCAP,  in the last 168 hours     Signed:  Kanden Carey  [facility] 06/27/2012, 11:08 AM

## 2012-06-27 NOTE — Progress Notes (Signed)
Shift event: RN paged NP 2/2 finding 2 empty bottles of Oxycodone in pt's room. Both dated filled on 06/14/12 and both for #60. Pt was difficult to arouse at that time, but now is alert/oriented asking for IV dilaudid. When confronted, she said that she "dumps her pills into an old bottle at home and hasn't taken any of the Oxycodone". Pt will receive only Advil for rest of night and will report event to oncoming attending for further action. Pt is slated for possible d/c home today.  Jimmye Norman, NP [facility]

## 2012-07-18 ENCOUNTER — Encounter (HOSPITAL_COMMUNITY): Payer: Self-pay | Admitting: Internal Medicine

## 2012-07-18 ENCOUNTER — Inpatient Hospital Stay (HOSPITAL_COMMUNITY)
Admission: EM | Admit: 2012-07-18 | Discharge: 2012-07-25 | DRG: 812 | Disposition: A | Discharge status: Home / Self Care | Payer: Medicaid Other | Attending: Internal Medicine | Admitting: Internal Medicine

## 2012-07-18 ENCOUNTER — Inpatient Hospital Stay (HOSPITAL_COMMUNITY): Payer: Medicaid Other

## 2012-07-18 DIAGNOSIS — F1210 Cannabis abuse, uncomplicated: Secondary | ICD-10-CM | POA: Diagnosis present

## 2012-07-18 DIAGNOSIS — R000 Tachycardia, unspecified: Secondary | ICD-10-CM

## 2012-07-18 DIAGNOSIS — G43909 Migraine, unspecified, not intractable, without status migrainosus: Secondary | ICD-10-CM | POA: Diagnosis present

## 2012-07-18 DIAGNOSIS — I498 Other specified cardiac arrhythmias: Secondary | ICD-10-CM | POA: Diagnosis present

## 2012-07-18 DIAGNOSIS — D72829 Elevated white blood cell count, unspecified: Secondary | ICD-10-CM | POA: Diagnosis present

## 2012-07-18 DIAGNOSIS — R112 Nausea with vomiting, unspecified: Secondary | ICD-10-CM | POA: Diagnosis present

## 2012-07-18 DIAGNOSIS — F32A Depression, unspecified: Secondary | ICD-10-CM

## 2012-07-18 DIAGNOSIS — D5700 Hb-SS disease with crisis, unspecified: Principal | ICD-10-CM | POA: Diagnosis present

## 2012-07-18 DIAGNOSIS — Z79899 Other long term (current) drug therapy: Secondary | ICD-10-CM

## 2012-07-18 DIAGNOSIS — M549 Dorsalgia, unspecified: Secondary | ICD-10-CM

## 2012-07-18 DIAGNOSIS — F3289 Other specified depressive episodes: Secondary | ICD-10-CM | POA: Diagnosis present

## 2012-07-18 DIAGNOSIS — R519 Headache, unspecified: Secondary | ICD-10-CM | POA: Diagnosis present

## 2012-07-18 DIAGNOSIS — K219 Gastro-esophageal reflux disease without esophagitis: Secondary | ICD-10-CM

## 2012-07-18 DIAGNOSIS — R51 Headache: Secondary | ICD-10-CM | POA: Diagnosis present

## 2012-07-18 DIAGNOSIS — Z87891 Personal history of nicotine dependence: Secondary | ICD-10-CM

## 2012-07-18 DIAGNOSIS — F329 Major depressive disorder, single episode, unspecified: Secondary | ICD-10-CM | POA: Diagnosis present

## 2012-07-18 DIAGNOSIS — D571 Sickle-cell disease without crisis: Secondary | ICD-10-CM

## 2012-07-18 LAB — COMPREHENSIVE METABOLIC PANEL
Albumin: 4 g/dL (ref 3.5–5.2)
BUN: 9 mg/dL (ref 6–23)
Calcium: 9 mg/dL (ref 8.4–10.5)
Chloride: 102 mEq/L (ref 96–112)
Creatinine, Ser: 0.6 mg/dL (ref 0.50–1.10)
Total Bilirubin: 2.3 mg/dL — ABNORMAL HIGH (ref 0.3–1.2)

## 2012-07-18 LAB — URINALYSIS, ROUTINE W REFLEX MICROSCOPIC
Ketones, ur: NEGATIVE mg/dL
Leukocytes, UA: NEGATIVE
Nitrite: NEGATIVE
Protein, ur: NEGATIVE mg/dL
Urobilinogen, UA: 0.2 mg/dL (ref 0.0–1.0)

## 2012-07-18 LAB — CBC WITH DIFFERENTIAL/PLATELET
Basophils Absolute: 0.1 10*3/uL (ref 0.0–0.1)
Basophils Relative: 0 % (ref 0–1)
Eosinophils Absolute: 0.8 10*3/uL — ABNORMAL HIGH (ref 0.0–0.7)
Eosinophils Relative: 4 % (ref 0–5)
HCT: 24.3 % — ABNORMAL LOW (ref 36.0–46.0)
Hemoglobin: 8.7 g/dL — ABNORMAL LOW (ref 12.0–15.0)
MCH: 30 pg (ref 26.0–34.0)
MCHC: 35.8 g/dL (ref 30.0–36.0)
MCV: 83.8 fL (ref 78.0–100.0)
Monocytes Absolute: 1.5 10*3/uL — ABNORMAL HIGH (ref 0.1–1.0)
Monocytes Relative: 8 % (ref 3–12)
RDW: 18.1 % — ABNORMAL HIGH (ref 11.5–15.5)

## 2012-07-18 LAB — CBC
HCT: 24.6 % — ABNORMAL LOW (ref 36.0–46.0)
Platelets: 542 10*3/uL — ABNORMAL HIGH (ref 150–400)
RBC: 2.88 MIL/uL — ABNORMAL LOW (ref 3.87–5.11)
RDW: 18.3 % — ABNORMAL HIGH (ref 11.5–15.5)
WBC: 22.4 10*3/uL — ABNORMAL HIGH (ref 4.0–10.5)

## 2012-07-18 LAB — CREATININE, SERUM
GFR calc Af Amer: >90 mL/min (ref >90)
GFR calc non Af Amer: >90 mL/min (ref >90)

## 2012-07-18 LAB — POCT PREGNANCY, URINE: Preg Test, Ur: NEGATIVE

## 2012-07-18 MED ORDER — HYDROMORPHONE HCL PF 2 MG/ML IJ SOLN
2.0000 mg | Freq: Once | INTRAMUSCULAR | Status: AC
  Administered 2012-07-18: 2 mg via INTRAVENOUS
  Filled 2012-07-18: qty 1

## 2012-07-18 MED ORDER — SENNA 8.6 MG PO TABS
1.0000 | ORAL_TABLET | Freq: Two times a day (BID) | ORAL | Status: DC
  Administered 2012-07-18 – 2012-07-20 (×4): 8.6 mg via ORAL
  Filled 2012-07-18 (×4): qty 1

## 2012-07-18 MED ORDER — DOCUSATE SODIUM 100 MG PO CAPS
100.0000 mg | ORAL_CAPSULE | Freq: Two times a day (BID) | ORAL | Status: DC
  Administered 2012-07-18 – 2012-07-20 (×5): 100 mg via ORAL
  Filled 2012-07-18 (×6): qty 1

## 2012-07-18 MED ORDER — ENOXAPARIN SODIUM 30 MG/0.3ML ~~LOC~~ SOLN: 30.0000 mg | SUBCUTANEOUS | Status: DC

## 2012-07-18 MED ORDER — DIPHENHYDRAMINE HCL 25 MG PO CAPS
25.0000 mg | ORAL_CAPSULE | Freq: Four times a day (QID) | ORAL | Status: DC | PRN
  Administered 2012-07-19 (×2): 25 mg via ORAL
  Filled 2012-07-18 (×2): qty 1

## 2012-07-18 MED ORDER — HYDROXYUREA 500 MG PO CAPS
1000.0000 mg | ORAL_CAPSULE | Freq: Every day | ORAL | Status: DC
  Administered 2012-07-18 – 2012-07-25 (×8): 1000 mg via ORAL
  Filled 2012-07-18 (×9): qty 2

## 2012-07-18 MED ORDER — ENOXAPARIN SODIUM 40 MG/0.4ML ~~LOC~~ SOLN
40.0000 mg | SUBCUTANEOUS | Status: DC
  Administered 2012-07-18 – 2012-07-24 (×4): 40 mg via SUBCUTANEOUS
  Filled 2012-07-18 (×8): qty 0.4

## 2012-07-18 MED ORDER — ONDANSETRON HCL 4 MG/2ML IJ SOLN
4.0000 mg | Freq: Once | INTRAMUSCULAR | Status: AC
  Administered 2012-07-18: 4 mg via INTRAVENOUS

## 2012-07-18 MED ORDER — ACETAMINOPHEN 325 MG PO TABS
650.0000 mg | ORAL_TABLET | Freq: Four times a day (QID) | ORAL | Status: DC | PRN
  Administered 2012-07-19 (×2): 650 mg via ORAL
  Filled 2012-07-18 (×2): qty 2

## 2012-07-18 MED ORDER — ONDANSETRON HCL 4 MG/2ML IJ SOLN
4.0000 mg | Freq: Four times a day (QID) | INTRAMUSCULAR | Status: DC | PRN
  Administered 2012-07-19 (×2): 4 mg via INTRAVENOUS
  Filled 2012-07-18 (×3): qty 2

## 2012-07-18 MED ORDER — HYDROMORPHONE HCL PF 2 MG/ML IJ SOLN
2.0000 mg | INTRAMUSCULAR | Status: DC
  Administered 2012-07-18: 2 mg via INTRAVENOUS
  Administered 2012-07-18 (×2): 4 mg via INTRAVENOUS
  Administered 2012-07-18 (×2): 2 mg via INTRAVENOUS
  Administered 2012-07-18 (×2): 4 mg via INTRAVENOUS
  Administered 2012-07-19: 2 mg via INTRAVENOUS
  Administered 2012-07-19 (×2): 4 mg via INTRAVENOUS
  Administered 2012-07-19 (×2): 2 mg via INTRAVENOUS
  Administered 2012-07-19 – 2012-07-20 (×16): 4 mg via INTRAVENOUS
  Administered 2012-07-21 (×2): 2 mg via INTRAVENOUS
  Administered 2012-07-21 (×2): 4 mg via INTRAVENOUS
  Administered 2012-07-21: 2 mg via INTRAVENOUS
  Administered 2012-07-21 (×2): 4 mg via INTRAVENOUS
  Administered 2012-07-21: 2 mg via INTRAVENOUS
  Administered 2012-07-21 – 2012-07-22 (×3): 4 mg via INTRAVENOUS
  Administered 2012-07-22 (×4): 2 mg via INTRAVENOUS
  Administered 2012-07-22: 4 mg via INTRAVENOUS
  Administered 2012-07-22 (×2): 2 mg via INTRAVENOUS
  Filled 2012-07-18 (×11): qty 2
  Filled 2012-07-18 (×2): qty 1
  Filled 2012-07-18: qty 2
  Filled 2012-07-18: qty 1
  Filled 2012-07-18: qty 2
  Filled 2012-07-18 (×2): qty 1
  Filled 2012-07-18: qty 2
  Filled 2012-07-18 (×2): qty 1
  Filled 2012-07-18: qty 2
  Filled 2012-07-18: qty 1
  Filled 2012-07-18 (×15): qty 2
  Filled 2012-07-18 (×2): qty 1
  Filled 2012-07-18 (×5): qty 2
  Filled 2012-07-18: qty 1
  Filled 2012-07-18: qty 2

## 2012-07-18 MED ORDER — SODIUM CHLORIDE 0.9 % IV BOLUS (SEPSIS)
1000.0000 mL | Freq: Once | INTRAVENOUS | Status: AC
  Administered 2012-07-18: 1000 mL via INTRAVENOUS

## 2012-07-18 MED ORDER — IOHEXOL 350 MG/ML SOLN
100.0000 mL | Freq: Once | INTRAVENOUS | Status: AC | PRN
  Administered 2012-07-18: 100 mL via INTRAVENOUS

## 2012-07-18 MED ORDER — ONDANSETRON HCL 4 MG/2ML IJ SOLN
INTRAMUSCULAR | Status: AC
  Administered 2012-07-18: 16:00:00
  Filled 2012-07-18: qty 2

## 2012-07-18 MED ORDER — ACETAMINOPHEN 650 MG RE SUPP: 650.0000 mg | Freq: Four times a day (QID) | RECTAL | Status: DC | PRN

## 2012-07-18 MED ORDER — OXYCODONE HCL 5 MG PO TABS
15.0000 mg | ORAL_TABLET | ORAL | Status: DC | PRN
  Administered 2012-07-19 – 2012-07-23 (×5): 15 mg via ORAL
  Filled 2012-07-18 (×5): qty 3

## 2012-07-18 MED ORDER — LEVOFLOXACIN IN D5W 750 MG/150ML IV SOLN
750.0000 mg | INTRAVENOUS | Status: DC
  Administered 2012-07-18 – 2012-07-19 (×2): 750 mg via INTRAVENOUS
  Filled 2012-07-18 (×2): qty 150

## 2012-07-18 MED ORDER — SODIUM CHLORIDE 0.9 % IJ SOLN
3.0000 mL | Freq: Two times a day (BID) | INTRAMUSCULAR | Status: DC
  Administered 2012-07-25: 3 mL via INTRAVENOUS

## 2012-07-18 MED ORDER — ONDANSETRON HCL 4 MG/2ML IJ SOLN
INTRAMUSCULAR | Status: AC
  Administered 2012-07-18: 4 mg via INTRAVENOUS
  Filled 2012-07-18: qty 2

## 2012-07-18 MED ORDER — SODIUM CHLORIDE 0.45 % IV SOLN
INTRAVENOUS | Status: DC
  Administered 2012-07-18 – 2012-07-19 (×2): via INTRAVENOUS
  Administered 2012-07-19: 1000 mL via INTRAVENOUS
  Administered 2012-07-20 – 2012-07-22 (×5): via INTRAVENOUS
  Administered 2012-07-23: 75 mL/h via INTRAVENOUS
  Administered 2012-07-23: 1000 mL via INTRAVENOUS
  Administered 2012-07-24 – 2012-07-25 (×3): via INTRAVENOUS

## 2012-07-18 NOTE — ED Notes (Signed)
Pt states progressively worsening bilateral knee pain x 2 hours. Pain is constant

## 2012-07-18 NOTE — ED Provider Notes (Signed)
History     CSN: [id number]  Arrival date & time 07/18/12  0246   First MD Initiated Contact with Patient 07/18/12 0335      Chief Complaint  Patient presents with  . Sickle Cell Pain Crisis    (Consider location/radiation/quality/duration/timing/severity/associated sxs/prior treatment) HPI Comments: Patient with history of sickle cell disease.  Presents with severe pain in both knees that feels similar to her prior sickle crises.  No injury or trauma.  No chest pain, cough, or shortness of breath.    Patient is a 22 y.o. female presenting with sickle cell pain. The history is provided by the patient.  Sickle Cell Pain Crisis  This is a recurrent problem. The current episode started today. The problem occurs occasionally. The problem has been rapidly worsening. The pain is associated with cold exposure. Pain location: knees. The pain is severe. Nothing relieves the symptoms. The symptoms are aggravated by activity and movement.    Past Medical History  Diagnosis Date  . H/O: 1 miscarriage 03/22/2011  . 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 Depression 01/06/2011  . GERD (gastroesophageal reflux disease) 02/17/2011  . Trichotillomania     h/o  . Blood transfusion     "lots"  . Sickle cell anemia with crisis   . Exertional dyspnea     "sometimes"  . Sickle cell anemia   . Headache   . Migraines 11/08/11    "@ least twice/month"  . Chronic back pain     "very severe; have knot in my back; from tight muscle; take RX and exercise for it"  . Mood swings 11/08/11    "I go back and forth; real bad"  . Pregnancy   . Blood dyscrasia     SICKLE CELL    Past Surgical History  Procedure Laterality Date  . Cholecystectomy  05/2010  . Dilation and curettage of uterus  02/20/11    S/P miscarriage    Family History  Problem Relation Age of Onset  . Diabetes Mother     History  Substance Use Topics  . Smoking status: Former Smoker    Quit date: 06/17/2011  . Smokeless tobacco: Never Used   . Alcohol Use: No     Comment: pt states she quit in May    OB History   Grav Para Term Preterm Abortions TAB SAB Ect Mult Living   2    1  1         Obstetric Comments   Miscarried in October 2012 at about 7 weeks      Review of Systems  All other systems reviewed and are negative.    Allergies  Carrot oil; Latex; and Toradol  Home Medications   Current Outpatient Rx  Name  Route  Sig  Dispense  Refill  . hydroxyurea (HYDREA) 500 MG capsule   Oral   Take 1,000 mg by mouth daily. May take with food to minimize GI side effects.         Marland Kitchen oxyCODONE (ROXICODONE) 15 MG immediate release tablet   Oral   Take 15 mg by mouth every 4 (four) hours as needed. For pain.         Marland Kitchen senna (SENOKOT) 8.6 MG TABS   Oral   Take 1 tablet (8.6 mg total) by mouth 2 (two) times daily.   120 each        BP 110/49  Pulse 99  Temp(Src) 97.7 F (36.5 C) (Oral)  Resp 16  SpO2 100%  LMP 06/14/2012  Physical Exam  Nursing note and vitals reviewed. Constitutional: She is oriented to person, place, and time. She appears well-developed and well-nourished. No distress.  HENT:  Head: Normocephalic and atraumatic.  Mouth/Throat: Oropharynx is clear and moist.  Neck: Normal range of motion. Neck supple.  Musculoskeletal:  Both knees are normal-appearing without effusion.  There is pain with range of motion but appear stable.  Neurological: She is alert and oriented to person, place, and time.  Skin: Skin is warm and dry. She is not diaphoretic.    ED Course  Procedures (including critical care time)  Labs Reviewed  CBC WITH DIFFERENTIAL  RETICULOCYTES   No results found.   No diagnosis found.    MDM  The patient presents with bilateral leg pain that appears to be related to a sickle cell crisis.  She is not improving in her pain despite iv fluids and 6 mg of dilaudid.  She does not feel as though she will be able to go home.  She has been admitted multiple times in the  past and feels as though she is at that point now.  The labs show Hb of 8.7 and elevated reticulocyte count.  I have spoken with [facility] who will see the patient and make arrangements for transfer to [facility] for further treatment.        Geoffery Lyons, MD 07/18/12 [phone]

## 2012-07-18 NOTE — ED Notes (Signed)
CT notified IV team established IV.

## 2012-07-18 NOTE — ED Notes (Signed)
Pt returned from CT, crying, talking on her cell phone and yelling

## 2012-07-18 NOTE — ED Notes (Signed)
Spoke with admitting MD Dr. Carmell Austria. Notified EKG complete, IV team paged for IV start for CT angio. RN in ED looking at both arms with ultrasound unable to find vein for IV. Admitting stating, if IV team unable to establish 20 gauge or greater for angio, go ahead and transfer pt to [facility].

## 2012-07-18 NOTE — ED Notes (Signed)
Paged IV team for IV start. Admitting MD ordering CT angio chest.

## 2012-07-18 NOTE — Progress Notes (Signed)
Pt noted to be yelling and moaning aloud, restless, moving about on stretcher.  Pt unable to answer questions.  Pt on the phone crying and yelling into the phone, once she is placed into her bed.   Attempted to calm pt down.

## 2012-07-18 NOTE — ED Notes (Signed)
Report has been called to 5E. Pt eating breakfast in room. [facility] has been paged.

## 2012-07-18 NOTE — ED Notes (Signed)
Pt presents today with sickle cell crisis experiencing bilateral knee pain that began about 1.5 hours ago.

## 2012-07-18 NOTE — ED Notes (Signed)
Unsuccessful attempt at an IV stick.  IV team paged.

## 2012-07-18 NOTE — H&P (Signed)
[facility] History and Physical  [name] ZOX:[id number] DOB: 1990-10-01 DOA: 07/18/2012  Referring physician: ED Physician. PCP: Willey Blade, MD  Specialists: None.  Chief Complaint: legs pain.  HPI: [name] is a 22 y.o. female with PMH significant for sickle cell diseases, last admission to hospital 06-22-2012 for sickle cell crisis. Patient presents complaining of legs pain, specially left knee that started at 12: 00 am day of admission. Pain is very severe, dilaudid given in the ED has not help. She was crying because of pain. She is also complaining of chest pain, worse with breathing, started today. She denies cough. She does relates chills. She relates pain with urination. She takes OxyContin 15 mg once a day. She also takes 2 tablet OxyContin 10 mg 2 times a day. She has been taking her hydroxyurea.   Review of Systems: The patient denies anorexia, fever, weight loss,, vision loss, decreased hearing, hoarseness,, syncope, dyspnea on exertion, peripheral edema, balance deficits, hemoptysis, abdominal pain, melena, hematochezia, severe indigestion/heartburn, hematuria, incontinence, genital sores, muscle weakness, suspicious skin lesions, transient blindness, difficulty walking, depression, unusual weight change, abnormal bleeding.   Past Medical History  Diagnosis Date  . H/O: 1 miscarriage 03/22/2011  . 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 Depression 01/06/2011  . GERD (gastroesophageal reflux disease) 02/17/2011  . Trichotillomania     h/o  . Blood transfusion     "lots"  . Sickle cell anemia with crisis   . Exertional dyspnea     "sometimes"  . Sickle cell anemia   . Headache   . Migraines 11/08/11    "@ least twice/month"  . Chronic back pain     "very severe; have knot in my back; from tight muscle; take RX and exercise for it"  . Mood swings 11/08/11    "I go back and forth; real bad"  . Pregnancy   . Blood dyscrasia     SICKLE CELL   Past Surgical  History  Procedure Laterality Date  . Cholecystectomy  05/2010  . Dilation and curettage of uterus  02/20/11    S/P miscarriage   Social History:  reports that she quit smoking about 13 months ago. She has never used smokeless tobacco. She reports that she uses illicit drugs (Marijuana and Other-see comments). She reports that she does not drink alcohol.   Allergies  Allergen Reactions  . Carrot Oil Hives and Swelling  . Latex Rash  . Toradol (Ketorolac Tromethamine) Hives and Itching    Family History  Problem Relation Age of Onset  . Diabetes Mother     Prior to Admission medications   Medication Sig Start Date End Date Taking? Authorizing Provider  hydroxyurea (HYDREA) 500 MG capsule Take 1,000 mg by mouth daily. May take with food to minimize GI side effects.    Historical Provider, MD  oxyCODONE (ROXICODONE) 15 MG immediate release tablet Take 15 mg by mouth every 4 (four) hours as needed. For pain.    Historical Provider, MD  senna (SENOKOT) 8.6 MG TABS Take 1 tablet (8.6 mg total) by mouth 2 (two) times daily. 06/27/12   Sorin Luanne Bras, MD   Physical Exam: Filed Vitals:   07/18/12 0257 07/18/12 0743  BP: 110/49 135/75  Pulse: 99 145  Temp: 97.7 F (36.5 C)   TempSrc: Oral   Resp: 16 26  SpO2: 100% 96%   General Appearance:    Alert, cooperative, no distress, appears stated age  Head:    Normocephalic, without obvious abnormality, atraumatic  Eyes:    PERRL, conjunctiva/corneas clear, EOM's intact,      Ears:    Normal TM's and external ear canals, both ears  Nose:   Nares normal, septum midline, mucosa normal, no drainage    or sinus tenderness  Throat:   Lips, mucosa, and tongue normal; teeth and gums normal  Neck:   Supple, symmetrical, trachea midline, no adenopathy;    thyroid:  no enlargement/tenderness/nodules; no carotid   bruit or JVD  Back:     Symmetric, no curvature, ROM normal, no CVA tenderness  Lungs:     Clear to auscultation bilaterally, respirations  unlabored  Chest Wall:    No tenderness or deformity   Heart:    Regular rate and rhythm, S1 and S2 normal, no murmur, rub   or gallop     Abdomen:     Soft, non-tender, bowel sounds active all four quadrants,    no masses, no organomegaly        Extremities:   Extremities normal, atraumatic, no cyanosis or edema, warm to palpation both knee.   Pulses:   2+ and symmetric all extremities  Skin:   Skin color, texture, turgor normal, no rashes or lesions  Lymph nodes:   Cervical, supraclavicular, and axillary nodes normal  Neurologic:   CNII-XII intact, normal strength, sensation and reflexes    throughout      Labs on Admission:  Basic Metabolic Panel: No results found for this basename: NA, K, CL, CO2, GLUCOSE, BUN, CREATININE, CALCIUM, MG, PHOS,  in the last 168 hours Liver Function Tests: No results found for this basename: AST, ALT, ALKPHOS, BILITOT, PROT, ALBUMIN,  in the last 168 hours No results found for this basename: LIPASE, AMYLASE,  in the last 168 hours No results found for this basename: AMMONIA,  in the last 168 hours CBC:  Recent Labs Lab 07/18/12 0350  WBC 19.0*  NEUTROABS 12.6*  HGB 8.7*  HCT 24.3*  MCV 83.8  PLT 503*   Cardiac Enzymes: No results found for this basename: CKTOTAL, CKMB, CKMBINDEX, TROPONINI,  in the last 168 hours  BNP (last 3 results)  Recent Labs  10/25/11 0337 10/26/11 0720 11/21/11 1220  PROBNP 41.7 29.9 16.2   CBG: No results found for this basename: GLUCAP,  in the last 168 hours  Radiological Exams on Admission: No results found.  EKG: Independently reviewed. Pending.   Assessment/Plan Active Problems:   SICKLE CELL ANEMIA   Sickle cell pain crisis   Leukocytosis   1-Sickle cell crisis: Admit to hospital, IV fluids, IV dilaudid 2 to 4 mg every 2 hours. Will order C-met. Patient with increase retic count, leukocytosis and knees pain.  2-Chest pain: Concern for Acute chest syndrome. IV fl;uids, IV dilaudid, IV  Levaquin. Will order CT angio rule out PE and evaluate for PNA>  3-Leukocytosis: Will do work up for infection. UA, culture, CT chest. IV Levaquin. Blood culture.  4-Tachycardia: IV fluids, EKG, CT chest.   Code Status: Full Code. Family Communication: Care discussed with patient. Disposition Plan: Expect 3 to 4 days.  Time spent: 60 minutes.  REGALADO,BELKYS [facility] Pager [phone]  If 7PM-7AM, please contact night-coverage [email or web address] Password [facility] 07/18/2012, 7:51 AM

## 2012-07-18 NOTE — ED Notes (Signed)
IV team at bedside 

## 2012-07-18 NOTE — ED Notes (Signed)
Pt in CT, stating TERRIBLE pain. Crying, wailing in hallway.

## 2012-07-19 LAB — URINE CULTURE
Colony Count: NO GROWTH
Culture: NO GROWTH

## 2012-07-19 LAB — COMPREHENSIVE METABOLIC PANEL
AST: 27 U/L (ref 0–37)
Albumin: 3.9 g/dL (ref 3.5–5.2)
Calcium: 8.8 mg/dL (ref 8.4–10.5)
Chloride: 99 mEq/L (ref 96–112)
Creatinine, Ser: 0.58 mg/dL (ref 0.50–1.10)

## 2012-07-19 LAB — DIFFERENTIAL
Basophils Relative: 0 % (ref 0–1)
Eosinophils Relative: 2 % (ref 0–5)
Lymphs Abs: 3.6 10*3/uL (ref 0.7–4.0)
Monocytes Relative: 9 % (ref 3–12)
Neutro Abs: 21.1 10*3/uL — ABNORMAL HIGH (ref 1.7–7.7)

## 2012-07-19 LAB — CBC
Hemoglobin: 7.6 g/dL — ABNORMAL LOW (ref 12.0–15.0)
MCH: 30 pg (ref 26.0–34.0)
MCV: 83.4 fL (ref 78.0–100.0)
RBC: 2.53 MIL/uL — ABNORMAL LOW (ref 3.87–5.11)

## 2012-07-19 MED ORDER — PROMETHAZINE HCL 25 MG/ML IJ SOLN
12.5000 mg | Freq: Once | INTRAMUSCULAR | Status: AC
  Administered 2012-07-19: 12.5 mg via INTRAVENOUS
  Filled 2012-07-19: qty 1

## 2012-07-19 MED ORDER — ONDANSETRON HCL 4 MG/2ML IJ SOLN
4.0000 mg | Freq: Four times a day (QID) | INTRAMUSCULAR | Status: DC | PRN
Start: 2012-07-19 — End: 2012-07-25
  Administered 2012-07-19: 4 mg via INTRAVENOUS

## 2012-07-19 NOTE — Progress Notes (Signed)
Pt. has not been wearing nasal cannula as instructed.Pt's oxygen saturation below 90%. Pt removes nasal cannula and has been educated on the importance of not removing the nasal cannula.

## 2012-07-19 NOTE — Progress Notes (Signed)
SICKLE CELL SERVICE PROGRESS NOTE  [name] ZOX:[id number] DOB: 07-Jun-1990 DOA: 07/18/2012 PCP: August Saucer ERIC, MD  Assessment/Plan: Active Problems:   Sickle cell pain crisis: Pt presented in acute VOC which began 2 days ago at an intensity of 10/10. She states that it is improved but is unable to give an intensity level of her pain. States that pain relief is lasting about 2 hours so will continue current regimen. Pt is allergic to Toradol and states that shehas never taken any other NSAID's.    Tachycardia: EKG shows sinus tachycardia. Telemetry shows sinus tachycardia concurrent with periods of increased pain. Suspect likely secondary to pain. Will discontinue telemetry.    Nausea & vomiting: Suspect patient having VOC in GI tract. Having nausea and had 2 episodes of emesis during rounds today. Will change diet to clear liquids as tolerated. Continue anti-emetics. If ongoing emesis, change medications to IV route.   Leukocytosis: Pt has no evidence of infection. This is likely related to inflammation associated with VOC. Will discontinue antibiotics.     Code Status: Full Code Family Communication: N/A Disposition Plan: Home at discharge  Terrisa Curfman A.  Pager [phone]. If 7PM-7AM, please contact night-coverage.  07/19/2012, 4:36 PM  LOS: 1 day   Brief narrative: [name] is a 22 y.o. female with PMH significant for sickle cell diseases, last admission to hospital 06-22-2012 for sickle cell crisis. Patient presents complaining of legs pain, specially left knee that started at 12: 00 am day of admission. Pain is very severe, dilaudid given in the ED has not help. She was crying because of pain. She is also complaining of chest pain, worse with breathing, started today. She denies cough. She does relates chills. She relates pain with urination. She takes OxyContin 15 mg once a day. She also takes 2 tablet OxyContin 10 mg 2 times a day. She has been taking her hydroxyurea.      Consultants:  None  Procedures:  None  Antibiotics:  Levaquin 3/5 >> 3/6  HPI/Subjective:  Pt localizes her pain to her knees and back. She describes it a throbbing and at it's greatest intensity is associated with nausea and emesis. She states that it is improved but is unable to give an intensity level of her pain. States that pain relief is lasting about 2 hours   Objective: Filed Vitals:   07/19/12 0128 07/19/12 0627 07/19/12 1031 07/19/12 1532  BP:  106/70 117/73 124/78  Pulse:  127 128 108  Temp:  98.2 F (36.8 C) 98.4 F (36.9 C) 98.5 F (36.9 C)  TempSrc:  Oral Oral Oral  Resp:  16 18 16   Height:      Weight:      SpO2: 90% 94% 86% 93%   Weight change:   Intake/Output Summary (Last 24 hours) at 07/19/12 1636 Last data filed at 07/19/12 0600  Gross per 24 hour  Intake   1720 ml  Output    375 ml  Net   1345 ml    General: Alert, awake, oriented x3, in mild acute distress.  HEENT: [name]/AT PEERL, EOMI, anicteric. OROPHARYNX:  Moist, No exudate/ erythema/lesions.  Heart: Regular rate and rhythm, without murmurs, rubs, gallops.  Lungs: Clear to auscultation, no wheezing or rhonchi noted. Abdomen: Soft, nontender, nondistended, positive bowel sounds.  Neuro: No focal neurological deficits noted cranial nerves II through XII grossly intact. DTRs 2+ bilaterally upper and lower extremities. Strength functional in bilateral upper and lower extremities. Musculoskeletal: No warm swelling or  erythema around joints, no spinal tenderness noted.   Data Reviewed: Basic Metabolic Panel:  Recent Labs Lab 07/18/12 0806 07/18/12 1215 07/19/12 0619  NA 138  --  134*  K 3.8  --  4.0  CL 102  --  99  CO2 24  --  26  GLUCOSE 95  --  111*  BUN 9  --  8  CREATININE 0.60 0.61 0.58  CALCIUM 9.0  --  8.8   Liver Function Tests:  Recent Labs Lab 07/18/12 0806 07/19/12 0619  AST 17 27  ALT 12 12  ALKPHOS 69 56  BILITOT 2.3* 6.4*  PROT 7.4 6.9  ALBUMIN 4.0  3.9   No results found for this basename: LIPASE, AMYLASE,  in the last 168 hours No results found for this basename: AMMONIA,  in the last 168 hours CBC:  Recent Labs Lab 07/18/12 0350 07/18/12 1215 07/19/12 0619  WBC 19.0* 22.4* 27.8*  NEUTROABS 12.6*  --  21.1*  HGB 8.7* 8.5* 7.6*  HCT 24.3* 24.6* 21.1*  MCV 83.8 85.4 83.4  PLT 503* 542* 463*   Cardiac Enzymes: No results found for this basename: CKTOTAL, CKMB, CKMBINDEX, TROPONINI,  in the last 168 hours BNP (last 3 results)  Recent Labs  10/25/11 0337 10/26/11 0720 11/21/11 1220  PROBNP 41.7 29.9 16.2   CBG:  Recent Labs Lab 07/18/12 2021  GLUCAP 107*    No results found for this or any previous visit (from the past 240 hour(s)).   Studies: Dg Shoulder Right  06/22/2012  *RADIOLOGY REPORT*  Clinical Data: Right shoulder pain and sickle cell anemia.  RIGHT SHOULDER - 2+ VIEW  Comparison:  05/06/2011  Findings: There is some sclerosis in the humeral head which appears slightly more prominent compared to the prior study.  Findings may be consistent with osteonecrosis.  No associated humeral head collapse or contour deformity.  No fracture or dislocation is identified.  Soft tissues are unremarkable.  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  No humeral head collapse or deformity is identified.   Original Report Authenticated By: Irish Lack, M.D.    Ct Angio Chest Pe W/cm &/or Wo Cm  07/18/2012  *RADIOLOGY REPORT*  Clinical Data: Sickle cell crisis, chest/leg pain  CT ANGIOGRAPHY CHEST  Technique:  Multidetector CT imaging of the chest using the standard protocol during bolus administration of intravenous contrast. Multiplanar reconstructed images including MIPs were obtained and reviewed to evaluate the vascular anatomy.  Contrast: OMNIPAQUE IOHEXOL 350 MG/ML SOLN  Comparison: Chest radiograph dated 06/22/2012  Findings: No evidence  of pulmonary embolism.  The lungs are clear.  3 mm subpleural lymph node along the right major fissure (series 6/image 47).  Additional probable 4 mm subpleural lymph node in the superior left lower lobe (series 6/image 38).  Both are unchanged and benign.  No pleural effusion or pneumothorax.  The visualized thyroid is unremarkable.  The heart is normal in size.  No pericardial effusion.  Residual thymic tissue in the anterior mediastinum (series 5/image 22).  No suspicious mediastinal, hilar, or axillary lymphadenopathy.  Visualized upper abdomen is notable for mild scarring / nodularity of the spleen.  Superior/inferior endplate vertebral body changes secondary to sickle cell disease.  IMPRESSION: No evidence of pulmonary embolism.  No evidence of acute cardiopulmonary disease.   Original Report Authenticated By: Charline Bills, M.D.    Ct Abdomen Pelvis W Contrast  06/22/2012  *RADIOLOGY REPORT*  Clinical Data: Right lower quadrant abdominal pain  CT ABDOMEN AND PELVIS WITH CONTRAST  Technique:  Multidetector CT imaging of the abdomen and pelvis was performed following the standard protocol during bolus administration of intravenous contrast.  Contrast: OMNIPAQUE IOHEXOL 300 MG/ML  SOLN  Comparison: 06/21/2011  Findings: Lung bases are clear.  Liver, pancreas, and adrenal glands are within normal limits.  Stable lobular contour of the spleen.  Status post cholecystectomy.  No intrahepatic or extrahepatic ductal dilatation.  Kidneys are within normal limits.  No hydronephrosis.  No evidence of bowel obstruction.  Normal appendix.  No evidence of abdominal aortic aneurysm.  No abdominopelvic ascites.  Small retroperitoneal lymph nodes which do not meet pathologic CT size criteria.  Small right lower quadrant lymph nodes measuring up to 9 mm short axis (series 2/57), chronic.  Uterus and left ovary are unremarkable.  2.6 cm right ovarian cyst / follicle.  Bladder is underdistended.  Visualized  thoracolumbar spine is notable for vertebral endplate changes characteristic of sickle cell.  Stable mild sclerosis in the bilateral femoral heads, left greater than right, suggesting early AVN.  IMPRESSION: Normal appendix.  No evidence of bowel obstruction.  2.6 cm right ovarian cyst / follicle.  Otherwise, no CT findings to account for the patient's abdominal pain.   Original Report Authenticated By: Charline Bills, M.D.    Dg Chest Port 1 View  06/22/2012  *RADIOLOGY REPORT*  Clinical Data: Sickle cell crisis.Chest pain.  PORTABLE CHEST - 1 VIEW  Comparison: 03/08/2012  Findings: Bilateral humeral head infarcts suspected. Midline trachea.  Borderline cardiomegaly.  Removal of left PICC line. No pleural effusion or pneumothorax.  Minimal right upper lobe scarring.  Left lung clear.  IMPRESSION: No acute cardiopulmonary disease.   Original Report Authenticated By: Jeronimo Greaves, M.D.     Scheduled Meds: . docusate sodium  100 mg Oral BID  . enoxaparin (LOVENOX) injection  40 mg Subcutaneous Q24H  .  HYDROmorphone (DILAUDID) injection  2-4 mg Intravenous Q2H  . hydroxyurea  1,000 mg Oral Daily  . senna  1 tablet Oral BID  . sodium chloride  3 mL Intravenous Q12H   Continuous Infusions: . sodium chloride 1,000 mL (07/19/12 0010)    Total time 38 minutes.

## 2012-07-19 NOTE — Progress Notes (Signed)
UR completed 

## 2012-07-20 LAB — CBC WITH DIFFERENTIAL/PLATELET
Basophils Absolute: 0.1 10*3/uL (ref 0.0–0.1)
Basophils Relative: 0 % (ref 0–1)
Eosinophils Absolute: 0.8 10*3/uL — ABNORMAL HIGH (ref 0.0–0.7)
Eosinophils Relative: 4 % (ref 0–5)
Lymphocytes Relative: 16 % (ref 12–46)
MCH: 30.4 pg (ref 26.0–34.0)
MCV: 85.4 fL (ref 78.0–100.0)
Platelets: 502 10*3/uL — ABNORMAL HIGH (ref 150–400)
RDW: 22.6 % — ABNORMAL HIGH (ref 11.5–15.5)
WBC: 23.4 10*3/uL — ABNORMAL HIGH (ref 4.0–10.5)

## 2012-07-20 LAB — COMPREHENSIVE METABOLIC PANEL
ALT: 13 U/L (ref 0–35)
AST: 26 U/L (ref 0–37)
Albumin: 3.7 g/dL (ref 3.5–5.2)
Calcium: 8.9 mg/dL (ref 8.4–10.5)
Sodium: 134 mEq/L — ABNORMAL LOW (ref 135–145)
Total Protein: 6.8 g/dL (ref 6.0–8.3)

## 2012-07-20 MED ORDER — SENNOSIDES-DOCUSATE SODIUM 8.6-50 MG PO TABS
1.0000 | ORAL_TABLET | Freq: Two times a day (BID) | ORAL | Status: DC
  Administered 2012-07-20 – 2012-07-25 (×8): 1 via ORAL
  Filled 2012-07-20 (×13): qty 1

## 2012-07-20 MED ORDER — PROMETHAZINE HCL 25 MG/ML IJ SOLN
12.5000 mg | Freq: Four times a day (QID) | INTRAMUSCULAR | Status: AC | PRN
  Administered 2012-07-20: 12.5 mg via INTRAVENOUS
  Filled 2012-07-20: qty 1

## 2012-07-20 MED ORDER — SODIUM CHLORIDE 0.9 % IJ SOLN: 9.0000 mL | INTRAMUSCULAR | Status: DC | PRN

## 2012-07-20 MED ORDER — HYDROMORPHONE 0.3 MG/ML IV SOLN
INTRAVENOUS | Status: DC
  Administered 2012-07-20: 13:00:00 via INTRAVENOUS
  Administered 2012-07-20: 1.19 mg via INTRAVENOUS
  Administered 2012-07-20: 1.59 mg via INTRAVENOUS
  Administered 2012-07-21: 1.39 mg via INTRAVENOUS
  Administered 2012-07-21 (×2): 1.19 mg via INTRAVENOUS
  Administered 2012-07-21: 1.79 mg via INTRAVENOUS
  Administered 2012-07-21: 09:00:00 via INTRAVENOUS
  Administered 2012-07-21: 2.59 mg via INTRAVENOUS
  Administered 2012-07-21: 0.399 mg via INTRAVENOUS
  Administered 2012-07-22: 2.19 mg via INTRAVENOUS
  Administered 2012-07-22: 2.59 mg via INTRAVENOUS
  Administered 2012-07-22: 1.19 mg via INTRAVENOUS
  Administered 2012-07-22: 19:00:00 via INTRAVENOUS
  Administered 2012-07-22 (×2): 0.999 mg via INTRAVENOUS
  Administered 2012-07-23: 0.799 mg via INTRAVENOUS
  Administered 2012-07-23: 3.39 mg via INTRAVENOUS
  Administered 2012-07-23: 2.79 mg via INTRAVENOUS
  Administered 2012-07-23: 1.59 mg via INTRAVENOUS
  Administered 2012-07-23: 2.79 mg via INTRAVENOUS
  Administered 2012-07-24: 1.99 mg via INTRAVENOUS
  Administered 2012-07-24: 25 mL via INTRAVENOUS
  Filled 2012-07-20 (×6): qty 25

## 2012-07-20 MED ORDER — NALOXONE HCL 0.4 MG/ML IJ SOLN: 0.4000 mg | INTRAMUSCULAR | Status: DC | PRN

## 2012-07-20 NOTE — Progress Notes (Signed)
SICKLE CELL SERVICE PROGRESS NOTE  SHAKAYA BHULLAR ZOX:[id number] DOB: 27-May-1990 DOA: 07/18/2012 PCP: August Saucer ERIC, MD  Assessment/Plan: Active Problems:   Sickle cell pain crisis:  States that pain relief is lasting only about 1 hour so will add PCA to use on a PRN basis. I expect pain control to improve in next 24-48 hours with this regimen then patient can be transitioned to oral medications. Pt is allergic to Toradol and states that she has never taken any other NSAID's.    Tachycardia: EKG shows sinus tachycardia. Associated with pain.IF persists despite pain control, will consider ECHO.    Nausea & vomiting: Resolved. Will advance diet as tolerated. Continue  IV anti-emetics for next 24 hours and transition to oral when tolerating diet.   Leukocytosis: Slight decreased since yesterday. Pt has no evidence of infection. Urine culture negative. This is likely related to inflammation associated with VOC. Will discontinue antibiotics.    Code Status: Full Code Family Communication: N/A Disposition Plan: Home at discharge  MATTHEWS,MICHELLE A.  Pager [phone]. If 7PM-7AM, please contact night-coverage.  07/20/2012, 3:32 PM  LOS: 2 days   Brief narrative: HUDSYN BARICH is a 22 y.o. female with PMH significant for sickle cell diseases, last admission to hospital 06-22-2012 for sickle cell crisis. Patient presents complaining of legs pain, specially left knee that started at 12: 00 am day of admission. Pain is very severe, dilaudid given in the ED has not help. She was crying because of pain. She is also complaining of chest pain, worse with breathing, started today. She denies cough. She does relates chills. She relates pain with urination. She takes OxyContin 15 mg once a day. She also takes 2 tablet OxyContin 10 mg 2 times a day. She has been taking her hydroxyurea.     Consultants:  None  Procedures:  None  Antibiotics:  Levaquin 3/5 >> 3/6  HPI/Subjective:  Pt states that  it is improved but is unable to give an intensity level of her pain. States that pain relief is lasting about 1 hour. She appears much more comfortable today. She's had no emesis since yesterday. She also states that her headache is improved.  Objective: Filed Vitals:   07/20/12 0600 07/20/12 1000 07/20/12 1253 07/20/12 1336  BP: 104/62 115/77  153/87  Pulse: 92 108  112  Temp: 98.1 F (36.7 C) 98.6 F (37 C)  98.2 F (36.8 C)  TempSrc: Oral Oral  Oral  Resp: 18 18 13 17   Height:      Weight:      SpO2: 99% 87% 98% 99%   Weight change:   Intake/Output Summary (Last 24 hours) at 07/20/12 1532 Last data filed at 07/20/12 1500  Gross per 24 hour  Intake   1040 ml  Output      0 ml  Net   1040 ml    General: Alert, awake, oriented x3, in no acute distress.  HEENT: [place]/AT PEERL, EOMI, anicteric. OROPHARYNX:  Moist, No exudate/ erythema/lesions.  Heart: Regular rate and rhythm, without murmurs, rubs, gallops.  Lungs: Clear to auscultation, no wheezing or rhonchi noted. Abdomen: Soft, nontender, nondistended, positive bowel sounds.  Neuro: No focal neurological deficits noted cranial nerves II through XII grossly intact. Strength functional in bilateral upper and lower extremities. Musculoskeletal: No warm swelling or erythema around joints, no spinal tenderness noted.   Data Reviewed: Basic Metabolic Panel:  Recent Labs Lab 07/18/12 0806 07/18/12 1215 07/19/12 0619 07/20/12 0516  NA 138  --  134* 134*  K 3.8  --  4.0 3.7  CL 102  --  99 98  CO2 24  --  26 25  GLUCOSE 95  --  111* 97  BUN 9  --  8 4*  CREATININE 0.60 0.61 0.58 0.55  CALCIUM 9.0  --  8.8 8.9   Liver Function Tests:  Recent Labs Lab 07/18/12 0806 07/19/12 0619 07/20/12 0516  AST 17 27 26   ALT 12 12 13   ALKPHOS 69 56 56  BILITOT 2.3* 6.4* 2.7*  PROT 7.4 6.9 6.8  ALBUMIN 4.0 3.9 3.7   No results found for this basename: LIPASE, AMYLASE,  in the last 168 hours No results found for this  basename: AMMONIA,  in the last 168 hours CBC:  Recent Labs Lab 07/18/12 0350 07/18/12 1215 07/19/12 0619 07/20/12 0516  WBC 19.0* 22.4* 27.8* 23.4*  NEUTROABS 12.6*  --  21.1* 16.6*  HGB 8.7* 8.5* 7.6* 7.5*  HCT 24.3* 24.6* 21.1* 21.1*  MCV 83.8 85.4 83.4 85.4  PLT 503* 542* 463* 502*   Cardiac Enzymes: No results found for this basename: CKTOTAL, CKMB, CKMBINDEX, TROPONINI,  in the last 168 hours BNP (last 3 results)  Recent Labs  10/25/11 0337 10/26/11 0720 11/21/11 1220  PROBNP 41.7 29.9 16.2   CBG:  Recent Labs Lab 07/18/12 2021  GLUCAP 107*    Recent Results (from the past 240 hour(s))  URINE CULTURE     Status: None   Collection Time    07/18/12  3:06 PM      Result Value Range Status   Specimen Description URINE, CLEAN CATCH   Final   Special Requests NONE   Final   Culture  Setup Time 07/18/2012 22:00   Final   Colony Count NO GROWTH   Final   Culture NO GROWTH   Final   Report Status 07/19/2012 FINAL   Final     Studies: Dg Shoulder Right  06/22/2012  *RADIOLOGY REPORT*  Clinical Data: Right shoulder pain and sickle cell anemia.  RIGHT SHOULDER - 2+ VIEW  Comparison:  05/06/2011  Findings: There is some sclerosis in the humeral head which appears slightly more prominent compared to the prior study.  Findings may be consistent with osteonecrosis.  No associated humeral head collapse or contour deformity.  No fracture or dislocation is identified.  Soft tissues are unremarkable.  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  No humeral head collapse or deformity is identified.   Original Report Authenticated By: Irish Lack, M.D.    Ct Angio Chest Pe W/cm &/or Wo Cm  07/18/2012  *RADIOLOGY REPORT*  Clinical Data: Sickle cell crisis, chest/leg pain  CT ANGIOGRAPHY CHEST  Technique:  Multidetector CT imaging of the chest using the standard protocol during bolus administration of  intravenous contrast. Multiplanar reconstructed images including MIPs were obtained and reviewed to evaluate the vascular anatomy.  Contrast: OMNIPAQUE IOHEXOL 350 MG/ML SOLN  Comparison: Chest radiograph dated 06/22/2012  Findings: No evidence of pulmonary embolism.  The lungs are clear.  3 mm subpleural lymph node along the right major fissure (series 6/image 47).  Additional probable 4 mm subpleural lymph node in the superior left lower lobe (series 6/image 38).  Both are unchanged and benign.  No pleural effusion or pneumothorax.  The visualized thyroid is unremarkable.  The heart is normal in size.  No pericardial effusion.  Residual thymic tissue in the anterior  mediastinum (series 5/image 22).  No suspicious mediastinal, hilar, or axillary lymphadenopathy.  Visualized upper abdomen is notable for mild scarring / nodularity of the spleen.  Superior/inferior endplate vertebral body changes secondary to sickle cell disease.  IMPRESSION: No evidence of pulmonary embolism.  No evidence of acute cardiopulmonary disease.   Original Report Authenticated By: Charline Bills, M.D.    Ct Abdomen Pelvis W Contrast  06/22/2012  *RADIOLOGY REPORT*  Clinical Data: Right lower quadrant abdominal pain  CT ABDOMEN AND PELVIS WITH CONTRAST  Technique:  Multidetector CT imaging of the abdomen and pelvis was performed following the standard protocol during bolus administration of intravenous contrast.  Contrast: OMNIPAQUE IOHEXOL 300 MG/ML  SOLN  Comparison: 06/21/2011  Findings: Lung bases are clear.  Liver, pancreas, and adrenal glands are within normal limits.  Stable lobular contour of the spleen.  Status post cholecystectomy.  No intrahepatic or extrahepatic ductal dilatation.  Kidneys are within normal limits.  No hydronephrosis.  No evidence of bowel obstruction.  Normal appendix.  No evidence of abdominal aortic aneurysm.  No abdominopelvic ascites.  Small retroperitoneal lymph nodes which do not meet  pathologic CT size criteria.  Small right lower quadrant lymph nodes measuring up to 9 mm short axis (series 2/57), chronic.  Uterus and left ovary are unremarkable.  2.6 cm right ovarian cyst / follicle.  Bladder is underdistended.  Visualized thoracolumbar spine is notable for vertebral endplate changes characteristic of sickle cell.  Stable mild sclerosis in the bilateral femoral heads, left greater than right, suggesting early AVN.  IMPRESSION: Normal appendix.  No evidence of bowel obstruction.  2.6 cm right ovarian cyst / follicle.  Otherwise, no CT findings to account for the patient's abdominal pain.   Original Report Authenticated By: Charline Bills, M.D.    Dg Chest Port 1 View  06/22/2012  *RADIOLOGY REPORT*  Clinical Data: Sickle cell crisis.Chest pain.  PORTABLE CHEST - 1 VIEW  Comparison: 03/08/2012  Findings: Bilateral humeral head infarcts suspected. Midline trachea.  Borderline cardiomegaly.  Removal of left PICC line. No pleural effusion or pneumothorax.  Minimal right upper lobe scarring.  Left lung clear.  IMPRESSION: No acute cardiopulmonary disease.   Original Report Authenticated By: Jeronimo Greaves, M.D.     Scheduled Meds: . enoxaparin (LOVENOX) injection  40 mg Subcutaneous Q24H  .  HYDROmorphone (DILAUDID) injection  2-4 mg Intravenous Q2H  . HYDROmorphone PCA 0.3 mg/mL   Intravenous Q4H  . hydroxyurea  1,000 mg Oral Daily  . senna-docusate  1 tablet Oral BID  . sodium chloride  3 mL Intravenous Q12H   Continuous Infusions: . sodium chloride 100 mL/hr at 07/20/12 0922    Total time 38 minutes.

## 2012-07-21 LAB — CBC WITH DIFFERENTIAL/PLATELET
Basophils Relative: 1 % (ref 0–1)
Eosinophils Relative: 6 % — ABNORMAL HIGH (ref 0–5)
HCT: 19.5 % — ABNORMAL LOW (ref 36.0–46.0)
Hemoglobin: 6.8 g/dL — CL (ref 12.0–15.0)
Lymphs Abs: 3.4 10*3/uL (ref 0.7–4.0)
MCH: 30.8 pg (ref 26.0–34.0)
MCHC: 34.9 g/dL (ref 30.0–36.0)
Monocytes Absolute: 1.7 10*3/uL — ABNORMAL HIGH (ref 0.1–1.0)
nRBC: 18 /100 WBC — ABNORMAL HIGH

## 2012-07-21 LAB — COMPREHENSIVE METABOLIC PANEL
AST: 19 U/L (ref 0–37)
Alkaline Phosphatase: 50 U/L (ref 39–117)
BUN: 5 mg/dL — ABNORMAL LOW (ref 6–23)
CO2: 29 mEq/L (ref 19–32)
Chloride: 101 mEq/L (ref 96–112)
Creatinine, Ser: 0.67 mg/dL (ref 0.50–1.10)
GFR calc non Af Amer: >90 mL/min (ref >90)
Potassium: 3.6 mEq/L (ref 3.5–5.1)
Total Bilirubin: 1.6 mg/dL — ABNORMAL HIGH (ref 0.3–1.2)

## 2012-07-21 NOTE — Progress Notes (Signed)
Subjective: A 22 year old female with known sickle cell anemia who is here with acute sickle cell pain crisis. Patient still complaining of severe pain despite being on Dilaudid PCA on every 2 hours Dilaudid. On further questioning it appears as if patient has not been getting the schedule medications at some points. This is because she has been sleeping through it and the nursing staff did not give her. When she wakes up however she's been complaining of severe pain. Accordingly she has not been pushing the PCA pump obviously when she is sleeping. This may account for the severe pain she is having at this point. No shortness of breath no cough no fever no nausea vomiting or diarrhea.  Objective: Vital signs in last 24 hours: Temp:  [97.5 F (36.4 C)-98.5 F (36.9 C)] 98.5 F (36.9 C) (03/08 1321) Pulse Rate:  [81-115] 105 (03/08 1321) Resp:  [14-18] 16 (03/08 1321) BP: (103-148)/(44-79) 121/76 mmHg (03/08 1321) SpO2:  [94 %-100 %] 100 % (03/08 1321) FiO2 (%):  [100 %] 100 % (03/08 0158) Weight change:  Last BM Date: 07/18/12  Intake/Output from previous day: 03/07 0701 - 03/08 0700 In: 1540 [P.O.:240; I.V.:1300] Out: 1200 [Urine:1200] Intake/Output this shift:    General appearance: alert, cooperative, appears stated age and moderate distress Eyes: conjunctivae/corneas clear. PERRL, EOM's intact. Fundi benign. Resp: clear to auscultation bilaterally Cardio: regular rate and rhythm, S1, S2 normal, no murmur, click, rub or gallop GI: soft, non-tender; bowel sounds normal; no masses,  no organomegaly Extremities: extremities normal, atraumatic, no cyanosis or edema Skin: Skin color, texture, turgor normal. No rashes or lesions Neurologic: Grossly normal  Lab Results:  Recent Labs  07/20/12 0516 07/21/12 0555  WBC 23.4* 14.2*  HGB 7.5* 6.8*  HCT 21.1* 19.5*  PLT 502* 449*   BMET  Recent Labs  07/20/12 0516 07/21/12 0555  NA 134* 136  K 3.7 3.6  CL 98 101  CO2 25 29   GLUCOSE 97 91  BUN 4* 5*  CREATININE 0.55 0.67  CALCIUM 8.9 8.5    Studies/Results: No results found.  Medications: I have reviewed the patient's current medications.  Assessment/Plan: 22 year old female with acute sickle cell pain crisis.  #1 sickle cell pain crisis: Patient will continue his combination of Dilaudid PCA and is scheduled to 2 hours Dilaudid IV. I have instructed the staff to give her a hospital Dilaudid and that for the next 24 hours even when she is sleeping except in situations where there is evidence of respiratory compromise or patient is actually highly sedated.  #2 nausea and vomiting: Seems to be resolving with treatment.  #3 sinus tachycardia: Seems resolved and at this point.  #4 sickle cell anemia: Her hemoglobin has dropped to 6.8. Denied coverage as ordered blood transfusion. We'll follow her H&H in the morning.  LOS: 3 days   GARBA,LAWAL 07/21/2012, 2:19 PM

## 2012-07-21 NOTE — Progress Notes (Addendum)
CRITICAL VALUE ALERT  Critical value received:  Hemoglobin 6.8  Date of notification:  07/21/2012   Time of notification:  6:42 AM   Critical value read back:Yes  Nurse who received alert: Lamar Blinks  MD notified (1st page):  Hospitalist Night Benedetto Coons Time of first page:  6:43am

## 2012-07-22 DIAGNOSIS — D5700 Hb-SS disease with crisis, unspecified: Principal | ICD-10-CM

## 2012-07-22 LAB — CBC
HCT: 25.1 % — ABNORMAL LOW (ref 36.0–46.0)
MCH: 30.6 pg (ref 26.0–34.0)
MCHC: 34.7 g/dL (ref 30.0–36.0)
MCV: 88.4 fL (ref 78.0–100.0)
Platelets: 515 10*3/uL — ABNORMAL HIGH (ref 150–400)
RDW: 22.8 % — ABNORMAL HIGH (ref 11.5–15.5)
WBC: 13.8 10*3/uL — ABNORMAL HIGH (ref 4.0–10.5)

## 2012-07-22 LAB — TYPE AND SCREEN
Antibody Screen: POSITIVE
Donor AG Type: NEGATIVE
Unit division: 0

## 2012-07-22 LAB — BASIC METABOLIC PANEL
BUN: 6 mg/dL (ref 6–23)
Calcium: 8.8 mg/dL (ref 8.4–10.5)
Creatinine, Ser: 0.7 mg/dL (ref 0.50–1.10)
GFR calc Af Amer: >90 mL/min (ref >90)
GFR calc non Af Amer: >90 mL/min (ref >90)

## 2012-07-22 MED ORDER — HYDROMORPHONE HCL PF 2 MG/ML IJ SOLN
2.0000 mg | INTRAMUSCULAR | Status: DC
  Administered 2012-07-22 – 2012-07-23 (×12): 2 mg via INTRAVENOUS
  Filled 2012-07-22 (×12): qty 1

## 2012-07-22 NOTE — Progress Notes (Signed)
Patient is refusing to ambulate this evening and refusing lovenox. She had agreed earlier but now states she's too hot and needs to be nude.

## 2012-07-22 NOTE — Progress Notes (Signed)
[name] was made aware of plan in reference to pain management and progression. She was given her oxy 15 which she can have every four hours as needed and 2mg  of dilaudid iv which is schedule every two hours. She has a order for 2-4 and has had 2mg  all day with not complaints and has not called out once for pain medication. She does still have the full dose pca dilaudid in addition to this. She was informed that she may have her iv narcotics decreased on tomorrow and she was very receptive to the plan.

## 2012-07-22 NOTE — Progress Notes (Signed)
Subjective: Patient states that her pain today is 7/10. She would like to continue her current pain medications. She has no chest pain or shortness of breath.    Objective: Vital signs in last 24 hours: Temp:  [98 F (36.7 C)-99.5 F (37.5 C)] 98.9 F (37.2 C) (03/09 1430) Pulse Rate:  [80-100] 94 (03/09 1430) Resp:  [10-18] 15 (03/09 1600) BP: (106-119)/(67-75) 106/70 mmHg (03/09 1430) SpO2:  [92 %-100 %] 94 % (03/09 1600) Weight change:  Last BM Date: 07/17/12  Intake/Output from previous day: 03/08 0701 - 03/09 0700 In: 1129.2 [I.V.:1116.7; Blood:12.5] Out: 1800 [Urine:1800] Intake/Output this shift:    General appearance: alert, cooperative, appears stated age and moderate distress Eyes: conjunctivae/corneas clear. PERRL, EOM's intact.  Resp: clear to auscultation bilaterally Cardio: regular rate and rhythm, S1, S2 normal, no murmur, click, rub or gallop GI: soft, non-tender; bowel sounds normal; no masses,  no organomegaly Extremities: extremities normal, atraumatic, no cyanosis or edema Skin: Skin color, texture, turgor normal. No rashes or lesions Neurologic: Grossly normal  Lab Results:  Recent Labs  07/21/12 0555 07/22/12 0519  WBC 14.2* 13.8*  HGB 6.8* 8.7*  HCT 19.5* 25.1*  PLT 449* 515*   BMET  Recent Labs  07/21/12 0555 07/22/12 0519  NA 136 136  K 3.6 4.1  CL 101 101  CO2 29 27  GLUCOSE 91 96  BUN 5* 6  CREATININE 0.67 0.70  CALCIUM 8.5 8.8    Medications: I have reviewed the patient's current medications.  Assessment/Plan: 22 year old female with acute sickle cell pain crisis.  #1 sickle cell pain crisis: Patient will continue his combination of Dilaudid PCA and is scheduled to 2 hours Dilaudid IV.  Decreased her schedule Dilaudid to 2 mg IV every 2 hours. Per nursing she has only been getting 2 mg every 2 hours throughout the day and has done well with that.  #2 nausea and vomiting: Seems to be resolving with treatment.  #3 sinus  tachycardia: Seems resolved and at this point.  #4 sickle cell anemia: Her hemoglobin is 8.7   LOS: 4 days   DAVIS, NOVLET JARRETT 07/22/2012, 5:22 PM

## 2012-07-22 NOTE — Progress Notes (Signed)
Patient has had a uneventful day. She has had company all day and has been awake and interacting with them. She is laughing, texting, talking, and/or chatting with boyfriend. She often has to be asked to stop texting etc in order for nursing to provide care. She always rates pain at 7-9 when assessed despite her presentation. We begin to administer po pain medication in an effort to progress the patient.She is eating very well without nausea or vomiting x48 hours so we will see how she tolerates po meds as well.

## 2012-07-23 DIAGNOSIS — K219 Gastro-esophageal reflux disease without esophagitis: Secondary | ICD-10-CM

## 2012-07-23 DIAGNOSIS — M549 Dorsalgia, unspecified: Secondary | ICD-10-CM

## 2012-07-23 DIAGNOSIS — F329 Major depressive disorder, single episode, unspecified: Secondary | ICD-10-CM

## 2012-07-23 DIAGNOSIS — D72829 Elevated white blood cell count, unspecified: Secondary | ICD-10-CM

## 2012-07-23 LAB — CBC
MCHC: 34.4 g/dL (ref 30.0–36.0)
Platelets: 393 10*3/uL (ref 150–400)
RDW: 22.6 % — ABNORMAL HIGH (ref 11.5–15.5)
WBC: 12.1 10*3/uL — ABNORMAL HIGH (ref 4.0–10.5)

## 2012-07-23 MED ORDER — HYDROMORPHONE HCL PF 1 MG/ML IJ SOLN
1.0000 mg | INTRAMUSCULAR | Status: DC
  Administered 2012-07-23: 2 mg via INTRAVENOUS
  Administered 2012-07-24 (×3): 1 mg via INTRAVENOUS
  Administered 2012-07-24: 2 mg via INTRAVENOUS
  Administered 2012-07-24: 1 mg via INTRAVENOUS
  Filled 2012-07-23 (×2): qty 1
  Filled 2012-07-23 (×2): qty 2
  Filled 2012-07-23 (×3): qty 1

## 2012-07-23 NOTE — Progress Notes (Signed)
Subjective: Patient states that her pain today is 8/10 again today.   Her nausea and vomiting has resolved.  She complains of  pain in all her joints.   Objective: Vital signs in last 24 hours: Temp:  [98 F (36.7 C)-99.5 F (37.5 C)] 98.9 F (37.2 C) (03/09 1430) Pulse Rate:  [80-100] 94 (03/09 1430) Resp:  [10-18] 15 (03/09 1600) BP: (106-119)/(67-75) 106/70 mmHg (03/09 1430) SpO2:  [92 %-100 %] 94 % (03/09 1600) Weight change:  Last BM Date: 07/17/12  Intake/Output from previous day: 03/08 0701 - 03/09 0700 In: 1129.2 [I.V.:1116.7; Blood:12.5] Out: 1800 [Urine:1800] Intake/Output this shift:    General Appearance: alert, cooperative, no acute distress. Eyes: conjunctivae/corneas clear. PERRL, EOM's intact.  Resp: clear to auscultation bilaterally; No W/R/R Cardio: regular rate and rhythm, S1, S2 normal, no murmur,  rub or gallop GI: soft, non-tender; bowel sounds normal; no masses,  no organomegaly Extremities: extremities normal, atraumatic, no cyanosis or edema Skin: Skin color, texture, turgor normal. No rashes or lesions Neurologic: Grossly normal  Lab Results:  Recent Labs  07/21/12 0555 07/22/12 0519  WBC 14.2* 13.8*  HGB 6.8* 8.7*  HCT 19.5* 25.1*  PLT 449* 515*   BMET  Recent Labs  07/21/12 0555 07/22/12 0519  NA 136 136  K 3.6 4.1  CL 101 101  CO2 29 27  GLUCOSE 91 96  BUN 5* 6  CREATININE 0.67 0.70  CALCIUM 8.5 8.8    Medications: I have reviewed the patient's current medications.  Assessment/Plan: 22 year old female with acute sickle cell pain crisis.  #1 Sickle Cell Pain Crisis: Patient will continue the combination of Dilaudid PCA and  Decreased her schedule Dilaudid to 1-2 mg IV every 2 hours today. Patient seem a lot better today.    2 Nausea and Vomiting: Resolved  #3 Sinus Tachycardia: Resolved,  This was probably pain related.   #4 Sickle Cell Anemia: Her hemoglobin is 8.7 and stable post transfusion.  #5 Leukocytosis:   Improved,  Mostly likely secondary to vaso-occlusive crisis.   LOS: 4 days   Ngan Qualls JARRETT 07/22/2012, 5:22 PM

## 2012-07-24 LAB — CBC
Hemoglobin: 9.4 g/dL — ABNORMAL LOW (ref 12.0–15.0)
MCH: 30.6 pg (ref 26.0–34.0)
Platelets: 436 10*3/uL — ABNORMAL HIGH (ref 150–400)
RBC: 3.07 MIL/uL — ABNORMAL LOW (ref 3.87–5.11)

## 2012-07-24 LAB — COMPREHENSIVE METABOLIC PANEL
Albumin: 3.7 g/dL (ref 3.5–5.2)
BUN: 10 mg/dL (ref 6–23)
Calcium: 8.7 mg/dL (ref 8.4–10.5)
Chloride: 101 mEq/L (ref 96–112)
Creatinine, Ser: 0.58 mg/dL (ref 0.50–1.10)
Total Bilirubin: 1.8 mg/dL — ABNORMAL HIGH (ref 0.3–1.2)
Total Protein: 7.3 g/dL (ref 6.0–8.3)

## 2012-07-24 MED ORDER — HYDROMORPHONE HCL PF 1 MG/ML IJ SOLN
1.0000 mg | INTRAMUSCULAR | Status: DC | PRN
  Administered 2012-07-24 (×2): 2 mg via INTRAVENOUS
  Administered 2012-07-24 (×3): 1 mg via INTRAVENOUS
  Administered 2012-07-25 (×4): 2 mg via INTRAVENOUS
  Administered 2012-07-25: 1 mg via INTRAVENOUS
  Filled 2012-07-24: qty 1
  Filled 2012-07-24 (×3): qty 2
  Filled 2012-07-24 (×2): qty 1
  Filled 2012-07-24 (×4): qty 2

## 2012-07-24 MED ORDER — OXYCODONE HCL 5 MG PO TABS
15.0000 mg | ORAL_TABLET | ORAL | Status: DC
  Administered 2012-07-24: 10 mg via ORAL
  Administered 2012-07-24 (×2): 15 mg via ORAL
  Administered 2012-07-25 (×2): 10 mg via ORAL
  Filled 2012-07-24 (×3): qty 3
  Filled 2012-07-24 (×2): qty 2
  Filled 2012-07-24: qty 3

## 2012-07-24 NOTE — Evaluation (Signed)
Physical Therapy Evaluation Patient Details Name: [name] MRN: [id number] DOB: February 17, 1991 Today's Date: 07/24/2012 Time: 7829-5621 PT Time Calculation (min): 16 min  PT Assessment / Plan / Recommendation Clinical Impression  Pt, is 22 y/o female admited 3/05 with pain in  legs and sickle cell crisis. Pt. was able to ambulate but had increase in back pain. Pt. may DC to home. tomorrow. Pt. can ambulate with staff continue while in acute care and determine if pt  will need to use her RW at DC.     PT Assessment  Patient needs continued PT services    Follow Up Recommendations  No PT follow up    Does the patient have the potential to tolerate intense rehabilitation      Barriers to Discharge        Equipment Recommendations  None recommended by PT    Recommendations for Other Services     Frequency Min 3X/week    Precautions / Restrictions     Pertinent Vitals/Pain Back  Not due meds.      Mobility  Bed Mobility Bed Mobility: Not assessed Transfers Transfers: Sit to Stand;Stand to Sit Sit to Stand: 7: Independent Stand to Sit: 7: Independent Ambulation/Gait Ambulation/Gait Assistance: 5: Supervision Ambulation Distance (Feet): 125 Feet Ambulation/Gait Assistance Details: held onto IV pole then [facility] as pole tended to hyperextend Back and c/o increaded pain. Gait Pattern: Step-through pattern;Antalgic Gait velocity: decr,    Exercises     PT Diagnosis: Difficulty walking;Generalized weakness;Acute pain  PT Problem List: Pain;Decreased mobility;Decreased activity tolerance PT Treatment Interventions: DME instruction;Gait training;Functional mobility training;Therapeutic activities;Patient/family education   PT Goals Acute Rehab PT Goals PT Goal Formulation: With patient Time For Goal Achievement: 07/31/12 Potential to Achieve Goals: Good Pt will Ambulate: >150 feet;with modified independence;with least restrictive assistive device PT Goal: Ambulate -  Progress: Goal set today  Visit Information  Last PT Received On: 07/24/12 Assistance Needed: +1    Subjective Data  Subjective: I have not walked, my back is hurting.It. feels funny. Patient Stated Goal: I am going home tomorrow.   Prior Functioning  Home Living Lives With: Family Available Help at Discharge: Family;Available PRN/intermittently Type of Home: House Home Access: Level entry Home Layout: One level Bathroom Shower/Tub: Engineer, manufacturing systems: Standard Home Adaptive Equipment: Walker - rolling Prior Function Level of Independence: Independent Driving: Yes Vocation: On disability Communication Communication: No difficulties    Cognition  Cognition Overall Cognitive Status: Appears within functional limits for tasks assessed/performed Arousal/Alertness: Awake/alert Orientation Level: Appears intact for tasks assessed Behavior During Session: [facility] for tasks performed    Extremity/Trunk Assessment Right Upper Extremity Assessment RUE ROM/Strength/Tone: [facility] for tasks assessed Left Upper Extremity Assessment LUE ROM/Strength/Tone: WFL for tasks assessed Right Lower Extremity Assessment RLE ROM/Strength/Tone: [facility] for tasks assessed Left Lower Extremity Assessment LLE ROM/Strength/Tone: WFL for tasks assessed   Balance    End of Session PT - End of Session Activity Tolerance: Patient limited by pain Patient left:  (on edge of bed.) Nurse Communication: Mobility status  GP     Rada Hay 07/24/2012, 4:20 PM

## 2012-07-24 NOTE — Progress Notes (Signed)
Pt requesting a ns flush after pain meds so she could feel it.  Informed her that this is not our policy.

## 2012-07-24 NOTE — Progress Notes (Signed)
Discontinued PCA per orders.

## 2012-07-24 NOTE — Progress Notes (Signed)
SICKLE CELL SERVICE PROGRESS NOTE  [name] QIO:[id number] DOB: 1990/07/10 DOA: 07/18/2012 PCP: August Saucer ERIC, MD  Assessment/Plan: Active Problems:   Sickle cell pain crisis:  States that pain is much improved. Patient has a protracted course of pain due to inability to effectively treat inflammatory pain as she has described anaphylaxis to NSAID's. Her pain is however much improved an patient is functional. Will transition to oral medications and likely d/c home tomorrow.    Tachycardia: Essentially resolved.    Nausea & vomiting: Resolved.Tolerating diet well.   Leukocytosis: Still mildly elevated with no evidence of infection. Urine culture negative. This is likely related to inflammation associated with VOC.     Code Status: Full Code Family Communication: N/A Disposition Plan: Home at discharge  MATTHEWS,MICHELLE A.  Pager [phone]. If 7PM-7AM, please contact night-coverage.  07/24/2012, 5:31 PM  LOS: 6 days   Brief narrative: [name] is a 22 y.o. female with PMH significant for sickle cell diseases, last admission to hospital 06-22-2012 for sickle cell crisis. Patient presents complaining of legs pain, specially left knee that started at 12: 00 am day of admission. Pain is very severe, dilaudid given in the ED has not help. She was crying because of pain. She is also complaining of chest pain, worse with breathing, started today. She denies cough. She does relates chills. She relates pain with urination. She takes OxyContin 15 mg once a day. She also takes 2 tablet OxyContin 10 mg 2 times a day. She has been taking her hydroxyurea.     Consultants:  None  Procedures:  None  Antibiotics:  Levaquin 3/5 >> 3/6  HPI/Subjective:  Pt states that it is improved to allow for ambulation. Still mlid pain in back and legs. Headache resolved.  Objective: Filed Vitals:   07/24/12 0603 07/24/12 0754 07/24/12 1437 07/24/12 1500  BP: 102/62  109/73   Pulse: 84  87    Temp: 98.1 F (36.7 C)  98.6 F (37 C)   TempSrc: Oral  Oral   Resp: 16 18 16    Height:      Weight:    86.682 kg (191 lb 1.6 oz)  SpO2: 98% 97% 99%    Weight change:   Intake/Output Summary (Last 24 hours) at 07/24/12 1731 Last data filed at 07/24/12 1400  Gross per 24 hour  Intake 2477.5 ml  Output   3000 ml  Net -522.5 ml    General: Alert, awake, oriented x3, in no acute distress.  HEENT: [place]/AT PEERL, EOMI, anicteric. OROPHARYNX:  Moist, No exudate/ erythema/lesions.  Heart: Regular rate and rhythm, without murmurs, rubs, gallops.  Lungs: Clear to auscultation, no wheezing or rhonchi noted. Abdomen: Soft, nontender, nondistended, positive bowel sounds.  Neuro: No focal neurological deficits noted. Strength functional in bilateral upper and lower extremities. Musculoskeletal: No warm swelling or erythema around joints, no spinal tenderness noted.   Data Reviewed: Basic Metabolic Panel:  Recent Labs Lab 07/19/12 0619 07/20/12 0516 07/21/12 0555 07/22/12 0519 07/24/12 0515  NA 134* 134* 136 136 137  K 4.0 3.7 3.6 4.1 4.3  CL 99 98 101 101 101  CO2 26 25 29 27 26   GLUCOSE 111* 97 91 96 100*  BUN 8 4* 5* 6 10  CREATININE 0.58 0.55 0.67 0.70 0.58  CALCIUM 8.8 8.9 8.5 8.8 8.7   Liver Function Tests:  Recent Labs Lab 07/18/12 0806 07/19/12 0619 07/20/12 0516 07/21/12 0555 07/24/12 0515  AST 17 27 26 19 23   ALT  12 12 13 12 17   ALKPHOS 69 56 56 50 57  BILITOT 2.3* 6.4* 2.7* 1.6* 1.8*  PROT 7.4 6.9 6.8 6.7 7.3  ALBUMIN 4.0 3.9 3.7 3.4* 3.7   No results found for this basename: LIPASE, AMYLASE,  in the last 168 hours No results found for this basename: AMMONIA,  in the last 168 hours CBC:  Recent Labs Lab 07/18/12 0350  07/19/12 0619 07/20/12 0516 07/21/12 0555 07/22/12 0519 07/23/12 0500 07/24/12 0515  WBC 19.0*  < > 27.8* 23.4* 14.2* 13.8* 12.1* 12.5*  NEUTROABS 12.6*  --  21.1* 16.6* 8.1*  --   --   --   HGB 8.7*  < > 7.6* 7.5* 6.8* 8.7*  8.8* 9.4*  HCT 24.3*  < > 21.1* 21.1* 19.5* 25.1* 25.6* 27.0*  MCV 83.8  < > 83.4 85.4 88.2 88.4 88.9 87.9  PLT 503*  < > 463* 502* 449* 515* 393 436*  < > = values in this interval not displayed. Cardiac Enzymes: No results found for this basename: CKTOTAL, CKMB, CKMBINDEX, TROPONINI,  in the last 168 hours BNP (last 3 results)  Recent Labs  10/25/11 0337 10/26/11 0720 11/21/11 1220  PROBNP 41.7 29.9 16.2   CBG:  Recent Labs Lab 07/18/12 2021  GLUCAP 107*    Recent Results (from the past 240 hour(s))  URINE CULTURE     Status: None   Collection Time    07/18/12  3:06 PM      Result Value Range Status   Specimen Description URINE, CLEAN CATCH   Final   Special Requests NONE   Final   Culture  Setup Time 07/18/2012 22:00   Final   Colony Count NO GROWTH   Final   Culture NO GROWTH   Final   Report Status 07/19/2012 FINAL   Final     Studies: Dg Shoulder Right  06/22/2012  *RADIOLOGY REPORT*  Clinical Data: Right shoulder pain and sickle cell anemia.  RIGHT SHOULDER - 2+ VIEW  Comparison:  05/06/2011  Findings: There is some sclerosis in the humeral head which appears slightly more prominent compared to the prior study.  Findings may be consistent with osteonecrosis.  No associated humeral head collapse or contour deformity.  No fracture or dislocation is identified.  Soft tissues are unremarkable.  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  No humeral head collapse or deformity is identified.   Original Report Authenticated By: Irish Lack, M.D.    Ct Angio Chest Pe W/cm &/or Wo Cm  07/18/2012  *RADIOLOGY REPORT*  Clinical Data: Sickle cell crisis, chest/leg pain  CT ANGIOGRAPHY CHEST  Technique:  Multidetector CT imaging of the chest using the standard protocol during bolus administration of intravenous contrast. Multiplanar reconstructed images including MIPs were obtained and reviewed to  evaluate the vascular anatomy.  Contrast: OMNIPAQUE IOHEXOL 350 MG/ML SOLN  Comparison: Chest radiograph dated 06/22/2012  Findings: No evidence of pulmonary embolism.  The lungs are clear.  3 mm subpleural lymph node along the right major fissure (series 6/image 47).  Additional probable 4 mm subpleural lymph node in the superior left lower lobe (series 6/image 38).  Both are unchanged and benign.  No pleural effusion or pneumothorax.  The visualized thyroid is unremarkable.  The heart is normal in size.  No pericardial effusion.  Residual thymic tissue in the anterior mediastinum (series 5/image 22).  No suspicious mediastinal, hilar, or axillary lymphadenopathy.  Visualized upper abdomen is notable for mild scarring / nodularity of the spleen.  Superior/inferior endplate vertebral body changes secondary to sickle cell disease.  IMPRESSION: No evidence of pulmonary embolism.  No evidence of acute cardiopulmonary disease.   Original Report Authenticated By: Charline Bills, M.D.    Ct Abdomen Pelvis W Contrast  06/22/2012  *RADIOLOGY REPORT*  Clinical Data: Right lower quadrant abdominal pain  CT ABDOMEN AND PELVIS WITH CONTRAST  Technique:  Multidetector CT imaging of the abdomen and pelvis was performed following the standard protocol during bolus administration of intravenous contrast.  Contrast: OMNIPAQUE IOHEXOL 300 MG/ML  SOLN  Comparison: 06/21/2011  Findings: Lung bases are clear.  Liver, pancreas, and adrenal glands are within normal limits.  Stable lobular contour of the spleen.  Status post cholecystectomy.  No intrahepatic or extrahepatic ductal dilatation.  Kidneys are within normal limits.  No hydronephrosis.  No evidence of bowel obstruction.  Normal appendix.  No evidence of abdominal aortic aneurysm.  No abdominopelvic ascites.  Small retroperitoneal lymph nodes which do not meet pathologic CT size criteria.  Small right lower quadrant lymph nodes measuring up to 9 mm short axis  (series 2/57), chronic.  Uterus and left ovary are unremarkable.  2.6 cm right ovarian cyst / follicle.  Bladder is underdistended.  Visualized thoracolumbar spine is notable for vertebral endplate changes characteristic of sickle cell.  Stable mild sclerosis in the bilateral femoral heads, left greater than right, suggesting early AVN.  IMPRESSION: Normal appendix.  No evidence of bowel obstruction.  2.6 cm right ovarian cyst / follicle.  Otherwise, no CT findings to account for the patient's abdominal pain.   Original Report Authenticated By: Charline Bills, M.D.    Dg Chest Port 1 View  06/22/2012  *RADIOLOGY REPORT*  Clinical Data: Sickle cell crisis.Chest pain.  PORTABLE CHEST - 1 VIEW  Comparison: 03/08/2012  Findings: Bilateral humeral head infarcts suspected. Midline trachea.  Borderline cardiomegaly.  Removal of left PICC line. No pleural effusion or pneumothorax.  Minimal right upper lobe scarring.  Left lung clear.  IMPRESSION: No acute cardiopulmonary disease.   Original Report Authenticated By: Jeronimo Greaves, M.D.     Scheduled Meds: . enoxaparin (LOVENOX) injection  40 mg Subcutaneous Q24H  . hydroxyurea  1,000 mg Oral Daily  . oxyCODONE  15 mg Oral Q4H  . senna-docusate  1 tablet Oral BID  . sodium chloride  3 mL Intravenous Q12H   Continuous Infusions: . sodium chloride 75 mL/hr at 07/24/12 0859    Total time 35 minutes.

## 2012-07-25 LAB — CREATININE, SERUM
Creatinine, Ser: 0.65 mg/dL (ref 0.50–1.10)
GFR calc Af Amer: >90 mL/min (ref >90)
GFR calc non Af Amer: >90 mL/min (ref >90)

## 2012-07-25 MED ORDER — POLYETHYLENE GLYCOL 3350 17 G PO PACK
17.0000 g | PACK | Freq: Every day | ORAL | Status: DC
  Administered 2012-07-25: 17 g via ORAL
  Filled 2012-07-25: qty 1

## 2012-07-25 NOTE — Discharge Summary (Signed)
[name] MRN: [id number] DOB/AGE: [birth year] 21 y.o.  Admit date: 07/18/2012 Discharge date: 07/25/2012  Primary Care Physician:  Nancy Blade, MD   Discharge Diagnoses:   Patient Active Problem List  Diagnosis  . SICKLE CELL ANEMIA  . TRICHOTILLOMANIA  . Active smoker  . Back pain  . Depression  . GERD (gastroesophageal reflux disease)  . Stress  . Overweight  . Vaso-occlusive sickle cell crisis  . Right thigh pain  . Tachycardia  . Headache  . Nausea & vomiting  . Sickle cell pain crisis  . Leukocytosis  . Sickle cell hemolytic anemia    DISCHARGE MEDICATION:   Medication List    TAKE these medications       hydroxyurea 500 MG capsule  Commonly known as:  HYDREA  Take 1,000 mg by mouth daily. May take with food to minimize GI side effects.     oxyCODONE 15 MG immediate release tablet  Commonly known as:  ROXICODONE  Take 15 mg by mouth every 4 (four) hours as needed. For pain.     senna 8.6 MG Tabs  Commonly known as:  SENOKOT  Take 1 tablet (8.6 mg total) by mouth 2 (two) times daily.          Consults:     SIGNIFICANT DIAGNOSTIC STUDIES:  Ct Angio Chest Pe W/cm &/or Wo Cm  07/18/2012  *RADIOLOGY REPORT*  Clinical Data: Sickle cell crisis, chest/leg pain  CT ANGIOGRAPHY CHEST  Technique:  Multidetector CT imaging of the chest using the standard protocol during bolus administration of intravenous contrast. Multiplanar reconstructed images including MIPs were obtained and reviewed to evaluate the vascular anatomy.  Contrast: OMNIPAQUE IOHEXOL 350 MG/ML SOLN  Comparison: Chest radiograph dated 06/22/2012  Findings: No evidence of pulmonary embolism.  The lungs are clear.  3 mm subpleural lymph node along the right major fissure (series 6/image 47).  Additional probable 4 mm subpleural lymph node in the superior left lower lobe (series 6/image 38).  Both are unchanged and benign.  No pleural effusion or pneumothorax.  The visualized thyroid is  unremarkable.  The heart is normal in size.  No pericardial effusion.  Residual thymic tissue in the anterior mediastinum (series 5/image 22).  No suspicious mediastinal, hilar, or axillary lymphadenopathy.  Visualized upper abdomen is notable for mild scarring / nodularity of the spleen.  Superior/inferior endplate vertebral body changes secondary to sickle cell disease.  IMPRESSION: No evidence of pulmonary embolism.  No evidence of acute cardiopulmonary disease.   Original Report Authenticated By: [name], M.D.      Recent Results (from the past 240 hour(s))  URINE CULTURE     Status: None   Collection Time    07/18/12  3:06 PM      Result Value Range Status   Specimen Description URINE, CLEAN CATCH   Final   Special Requests NONE   Final   Culture  Setup Time 07/18/2012 22:00   Final   Colony Count NO GROWTH   Final   Culture NO GROWTH   Final   Report Status 07/19/2012 FINAL   Final    BRIEF ADMITTING H & P: [name] is a 22 y.o. female with PMH significant for sickle cell diseases, last admission to hospital 06-22-2012 for sickle cell crisis. Patient presents complaining of legs pain, specially left knee that started at 12: 00 am day of admission. Pain is very severe, dilaudid given in the ED has not help. She was crying because  of pain. She is also complaining of chest pain, worse with breathing, started today. She denies cough. She does relates chills. She relates pain with urination. She takes OxyContin 15 mg once a day. She also takes 2 tablet OxyContin 10 mg 2 times a day. She has been taking her hydroxyurea.     Hospital Course:  Present on Admission:  . Sickle cell pain crisis: Pt was admitted with acute VOC localized to neck, back legs and chest. She also had a significant migraine headache and accompanying emesis. She was treated with IV bolus doses of analgesics. Once she was tolerating oral intake she was transitioned to oral medications and weaned  transitioned off  IV. At the tine of discharge she was functional with her pain controlled with oral medications and no S/S of withdrawal. Pt prescriptions were picked up from her PMD's office on Friday 07/20/2012 by her mother  . SICKLE CELL ANEMIA: Hb stable with Hbg at 9.4 at the time of discharge. Pt on Hydrea. Last electrophoresis showed Hb F at 23.9 %. Needs re-eval in April. Continue Hydrea.   . Leukocytosis: Essentially resolved.  . Nausea & vomiting: Associated with acute VOC likely affecting GI tract and also contributed to by migraine HA. Resolved.  . Tachycardia: resolved.  . Constipation: Pt had constipation during hospitalization which was treated successfully with miralax. Pt was also on daily senna-s for gut motility.  Disposition and Follow-up:  Pt discharged in stable condition and is to follow up with Dr. August [name] as needed or in 2 weeks.     Discharge Orders   Future Appointments [name] Department Dept Phone   08/13/2012 9:15 AM Nancy Char, MD [facility] [phone]   Future Orders Complete By Expires     Activity as tolerated - No restrictions  As directed     Diet general  As directed        DISCHARGE EXAM:  General: Alert, awake, oriented x3, in no acute distress.  Vital Signs: BP 103/58,HR 89, T 97.5 F (36.4 C), temperature source Oral, RR 16, height 5\' 3"  (1.6 m), weight 86.592 kg (190 lb 14.4 oz), last menstrual period 07/12/2012, SpO2 95.00%, not currently breastfeeding. HEENT: [name]/AT PEERL, EOMI, anicteric.  OROPHARYNX: Moist, No exudate/ erythema/lesions.  Heart: Regular rate and rhythm, without murmurs, rubs, gallops.  Lungs: Clear to auscultation, no wheezing or rhonchi noted.  Abdomen: Soft, nontender, nondistended, positive bowel sounds.  Neuro: No focal neurological deficits noted. Strength functional in bilateral upper and lower extremities.  Musculoskeletal: No warm swelling or erythema around joints, no spinal tenderness  noted.    Recent Labs  07/24/12 0515 07/25/12 0520  NA 137  --   K 4.3  --   CL 101  --   CO2 26  --   GLUCOSE 100*  --   BUN 10  --   CREATININE 0.58 0.65  CALCIUM 8.7  --     Recent Labs  07/24/12 0515  AST 23  ALT 17  ALKPHOS 57  BILITOT 1.8*  PROT 7.3  ALBUMIN 3.7   No results found for this basename: LIPASE, AMYLASE,  in the last 72 hours  Recent Labs  07/23/12 0500 07/24/12 0515  WBC 12.1* 12.5*  HGB 8.8* 9.4*  HCT 25.6* 27.0*  MCV 88.9 87.9  PLT 393 436*    Time spent in decision making and face to face time greater than 30 minutes. Signed: MATTHEWS,MICHELLE A. 07/25/2012, 10:59 AM

## 2012-07-26 NOTE — Progress Notes (Signed)
Patient discharged home with friend, alert and oriented, discharge instructions given, patient verbalize understanding of orders given, patient refuse MyChart and this time, will set up at home, patient in stable condition at this time

## 2012-08-07 ENCOUNTER — Encounter: Payer: Self-pay | Admitting: *Deleted

## 2012-08-08 ENCOUNTER — Encounter: Payer: Self-pay | Admitting: Internal Medicine

## 2012-08-13 ENCOUNTER — Ambulatory Visit: Payer: Self-pay | Admitting: Obstetrics & Gynecology

## 2012-08-20 IMAGING — CR DG CHEST 2V
2 series · 2 of 2 positions shown · non-contrast
Comparison: Two-view chest x-ray 07/14/2011, 06/09/2011,
05/19/2011, 10/20/2010.

CLINICAL DATA: Of breath.  History of sickle cell disease.

CHEST - 2 VIEW

[w chest pa]
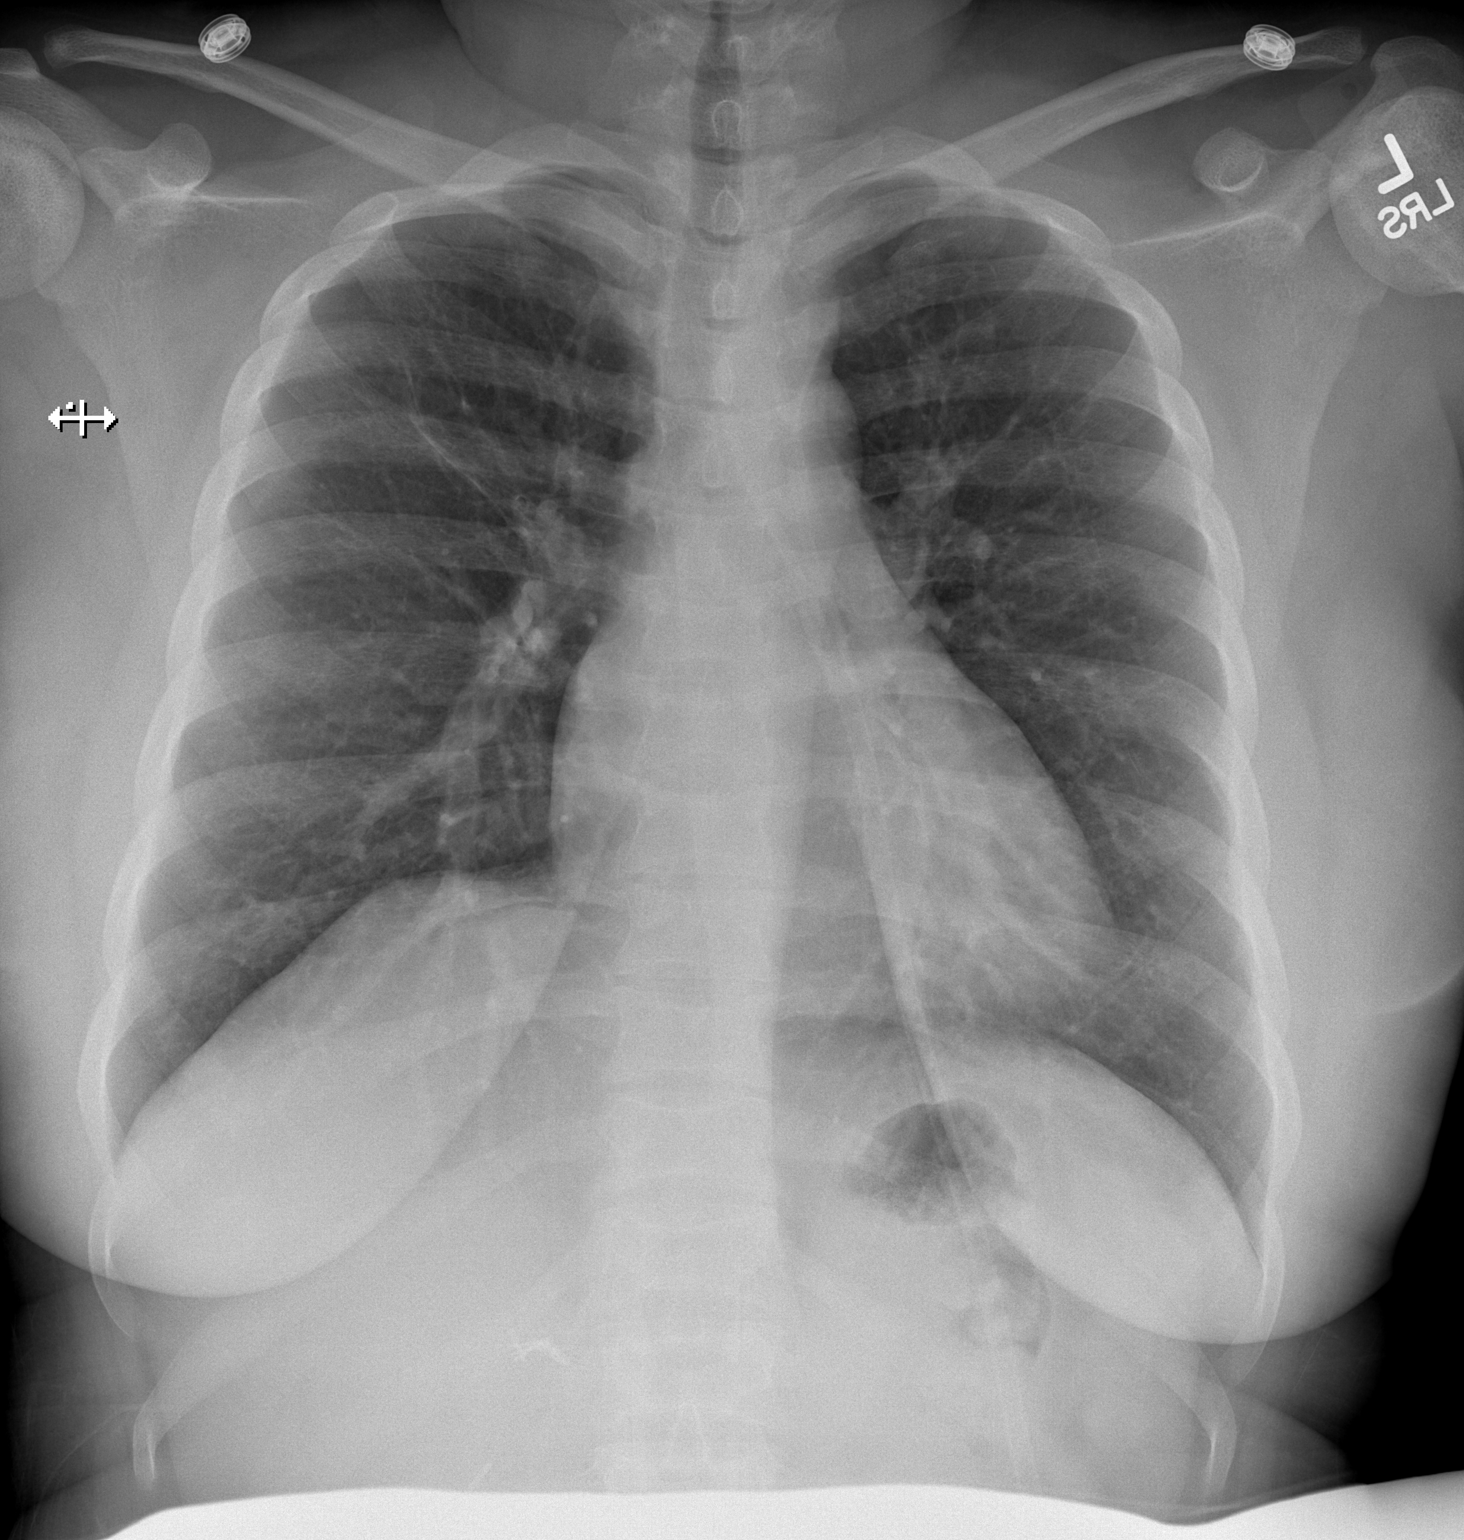

[w chest lat]
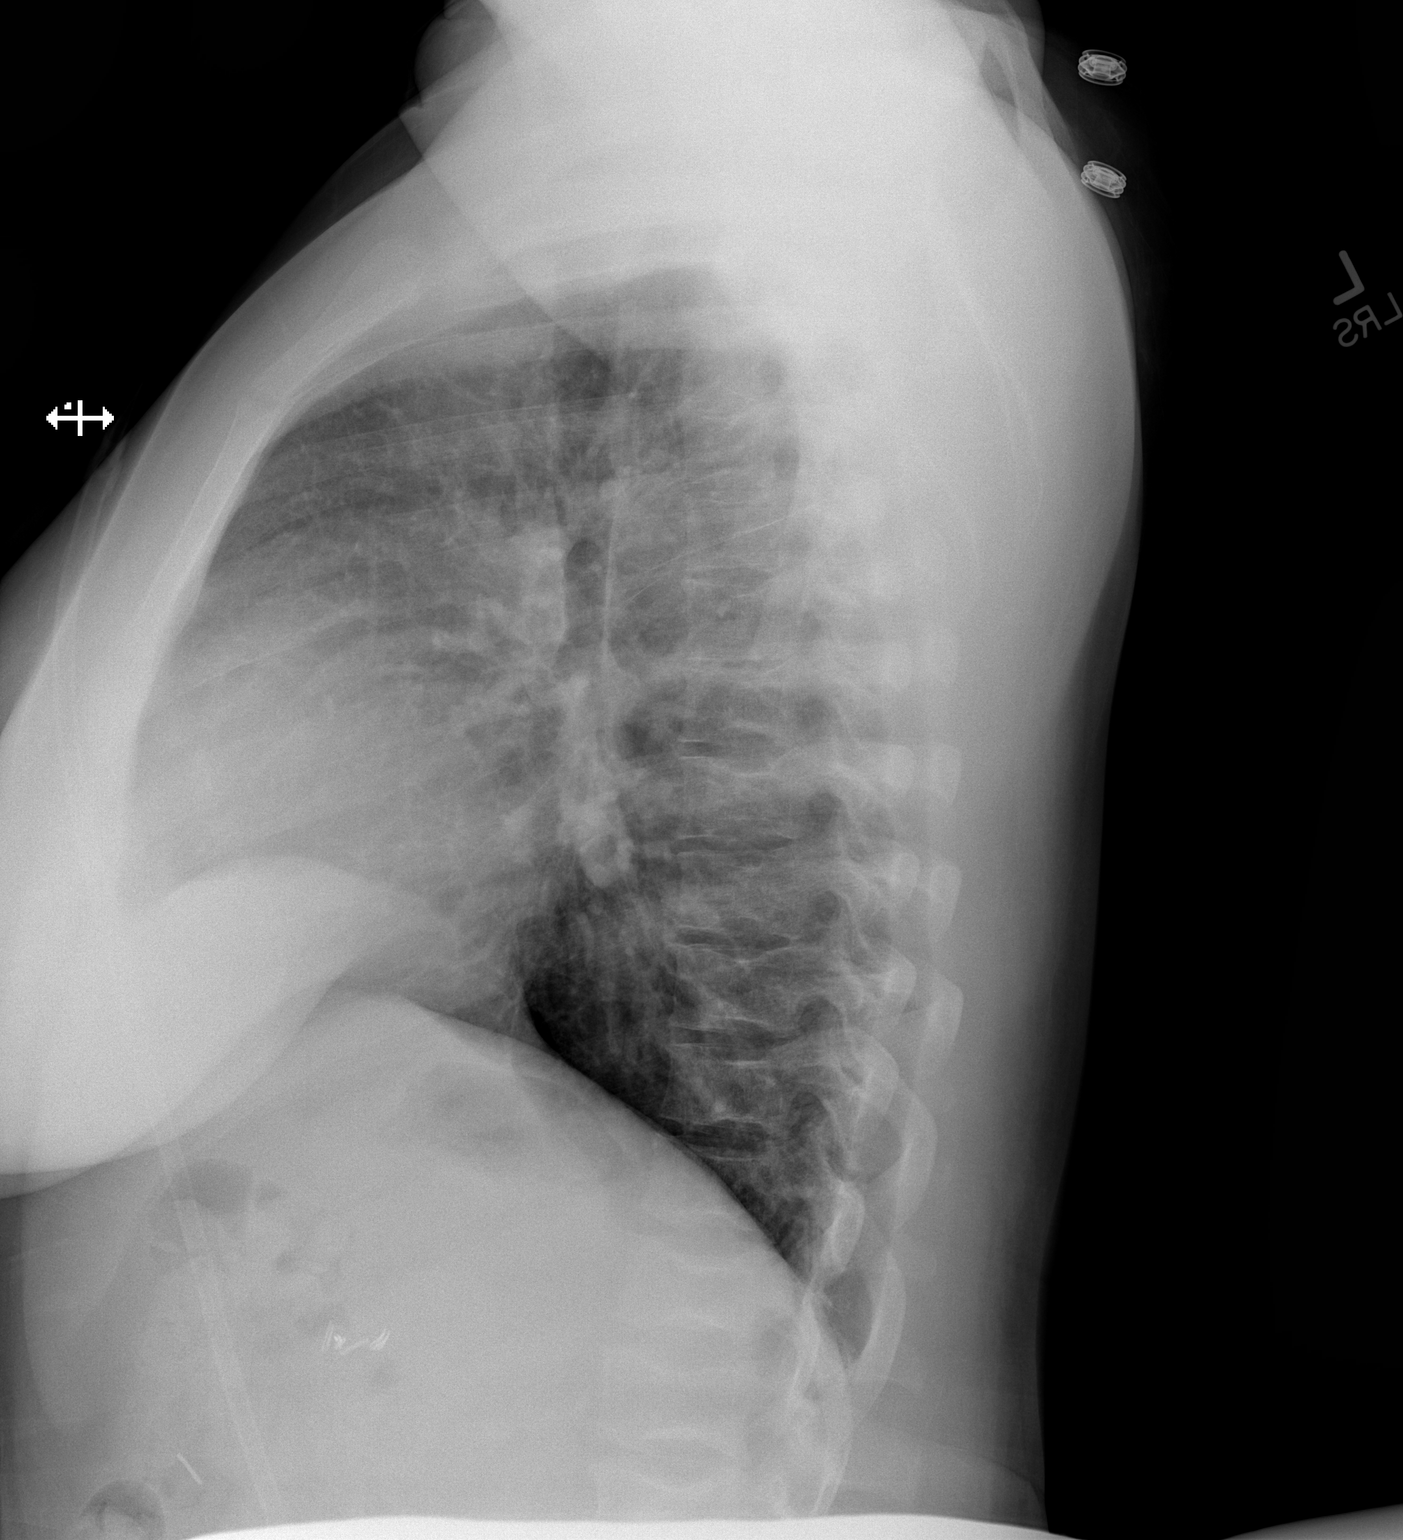

[2 of 2 positions shown; findings below may reference images not displayed]

FINDINGS: Cardiomediastinal silhouette unremarkable, unchanged.
Linear scarring in the right upper lobe, unchanged.  No new
pulmonary parenchymal abnormalities.  No pleural effusions.
Changes of sickle osteopathy involving the thoracic spine.  No
significant interval change.
IMPRESSION: No acute cardiopulmonary disease.  Linear scarring in the right
upper lobe.  Stable examination.

## 2012-08-25 IMAGING — US US TRANSVAGINAL NON-OB
1 series · 14 of 25 positions shown · non-contrast
Comparison: February 20, 2011

CLINICAL DATA: Right lower quadrant pain

TRANSABDOMINAL AND TRANSVAGINAL ULTRASOUND OF PELVIS
TECHNIQUE: Both transabdominal and transvaginal ultrasound
examinations of the pelvis were performed. Transabdominal technique
was performed for global imaging of the pelvis including uterus,
ovaries, adnexal regions, and pelvic cul-de-sac.

[Series 1: us transvaginal non-ob · 0.30mm/px · 14 of 51 slices shown]
[im 1/51]
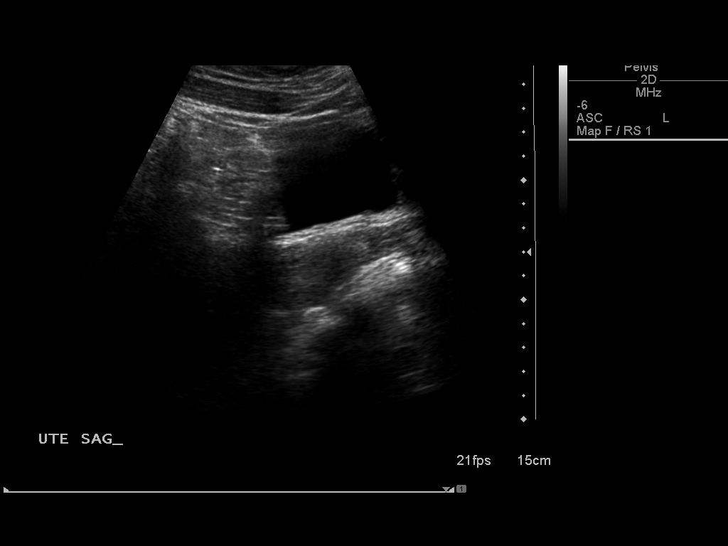
[im 5/51]
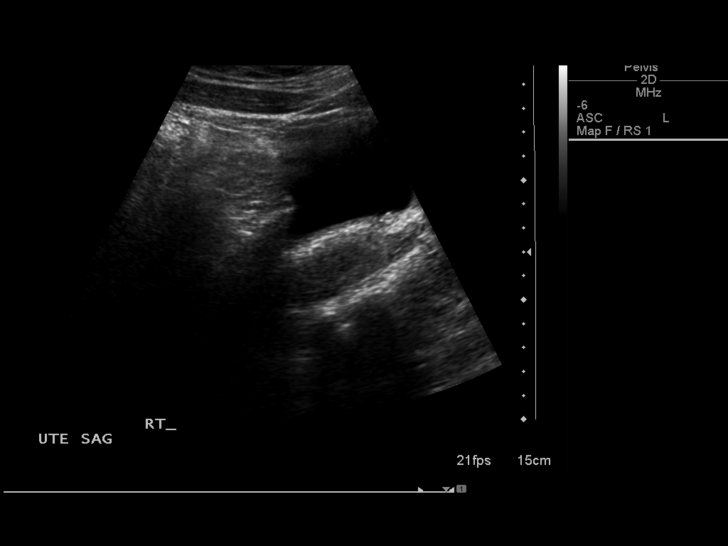
[im 9/51]
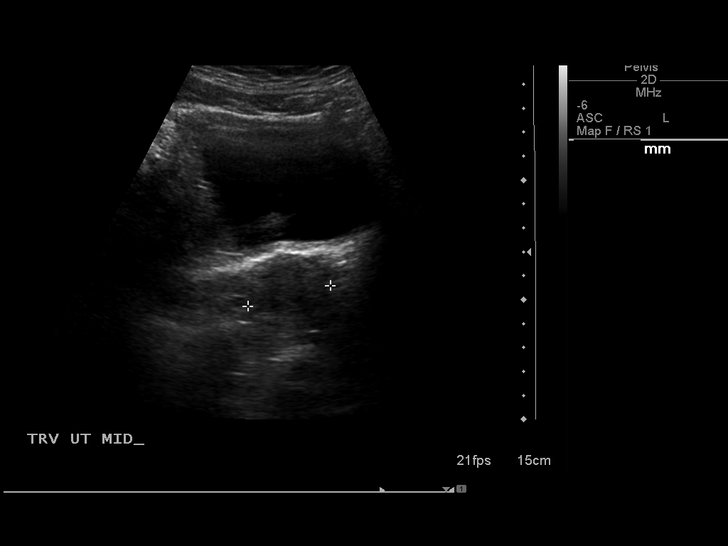
[im 13/51]
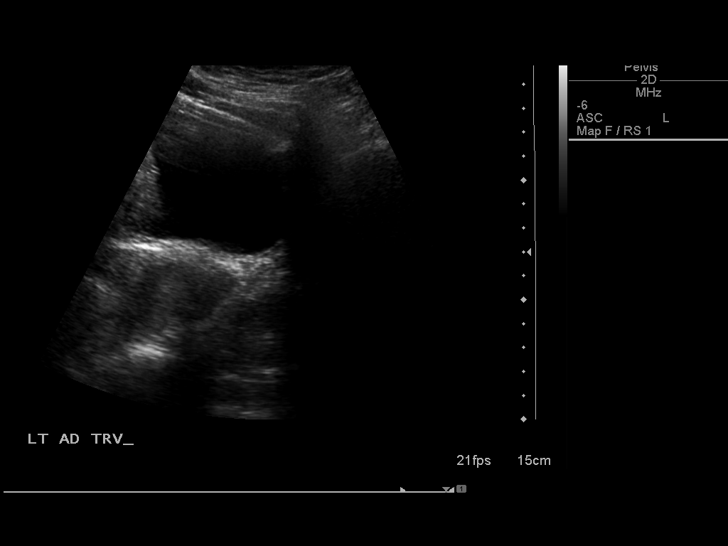
[im 17/51]
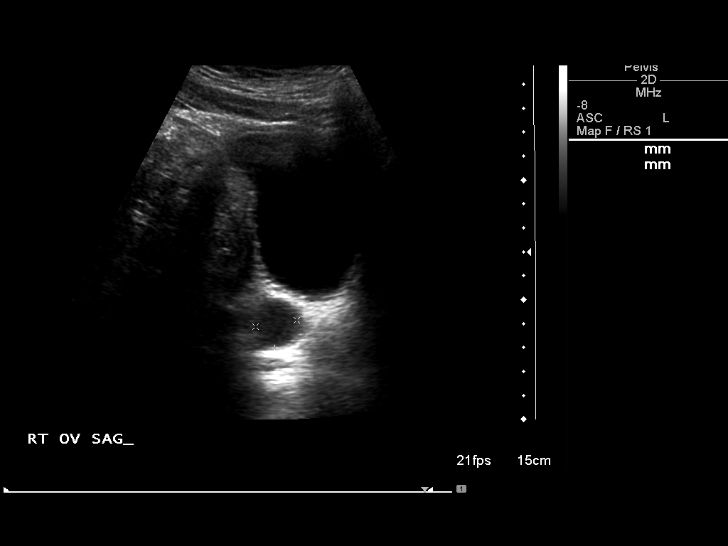
[im 19/51]
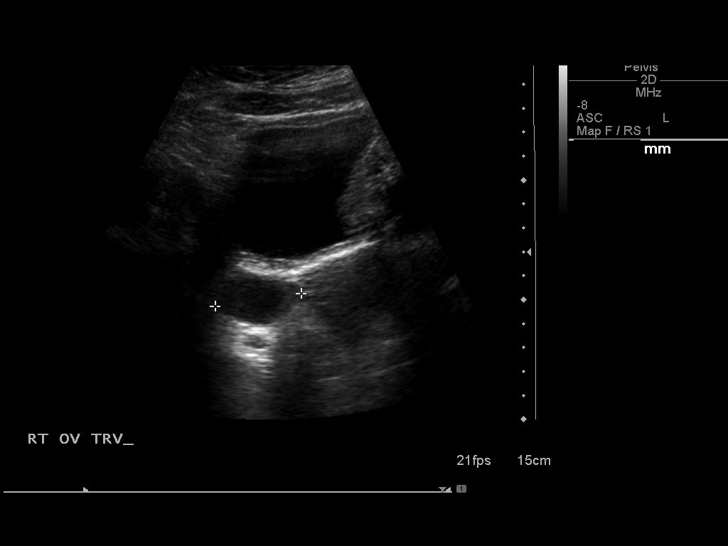
[im 23/51]
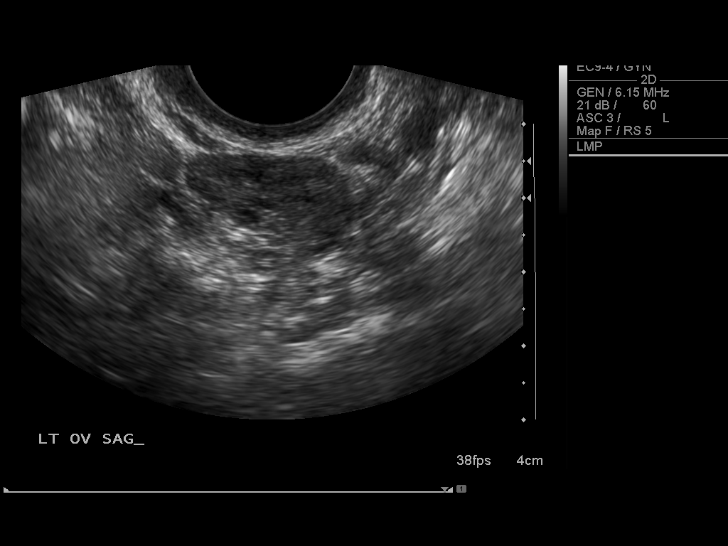
[im 28/51]
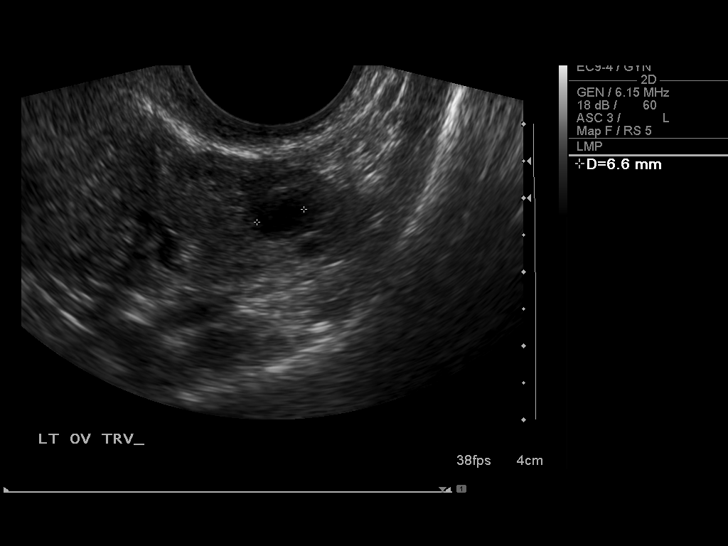
[im 32/51]
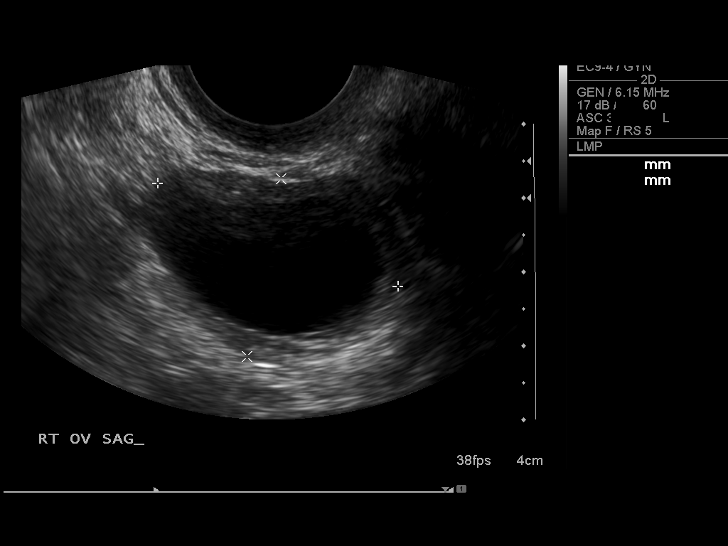
[im 34/51]
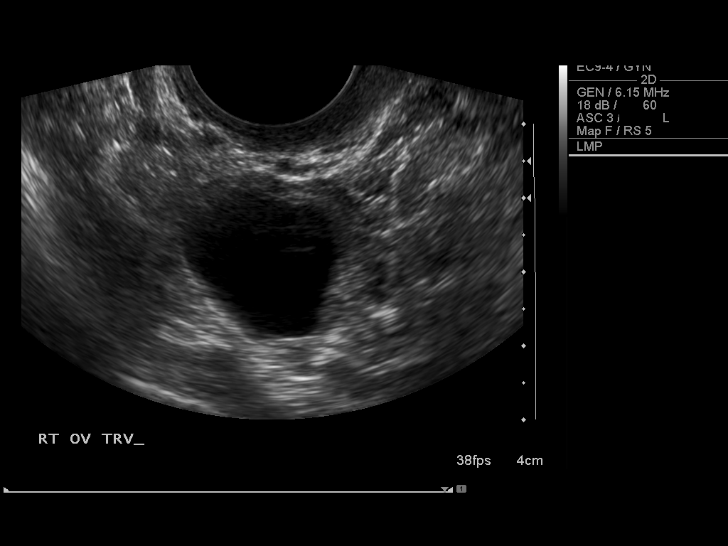
[im 38/51]
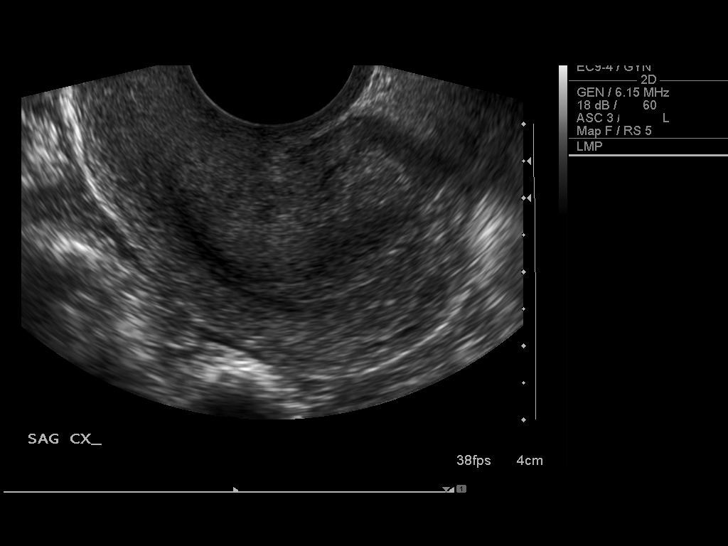
[im 42/51]
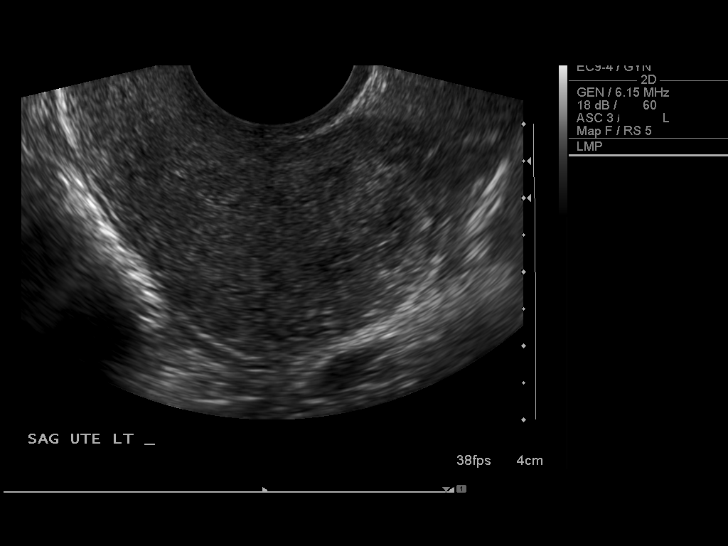
[im 46/51]
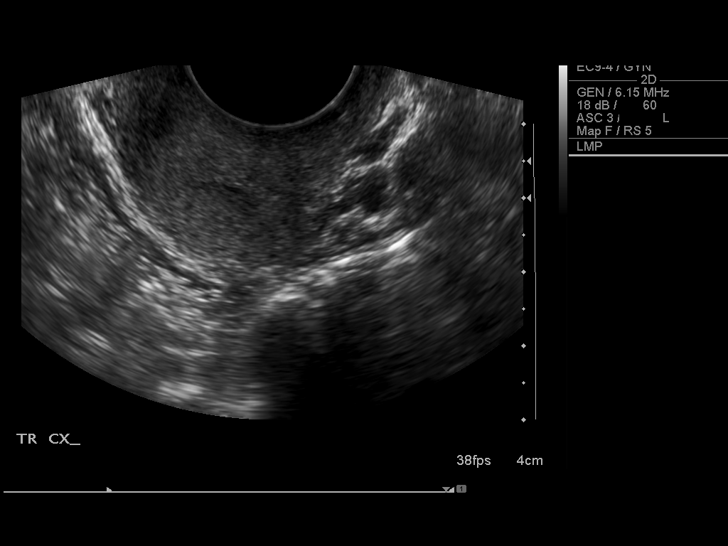
[im 51/51]
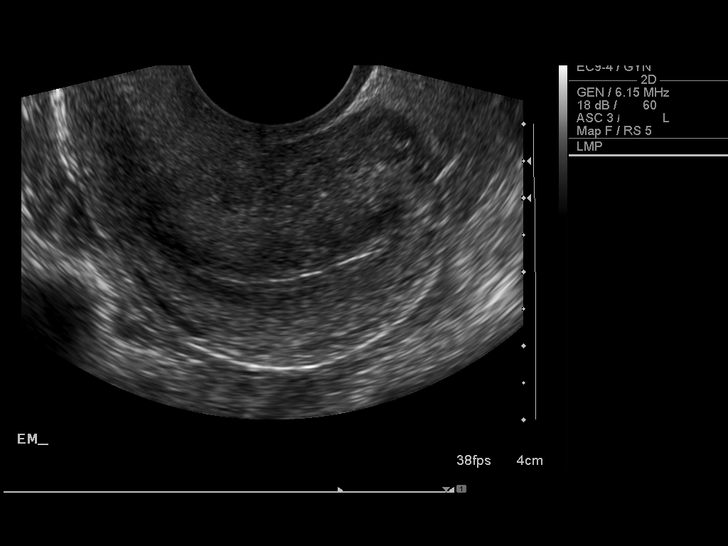

[14 of 25 positions shown; findings below may reference images not displayed]

It was necessary to proceed with endovaginal exam following the
transabdominal exam to visualize the adnexa.
FINDINGS: The uterus is normal in size and echotexture, measuring 6.8 x 3.0 x
3.6 cm.  Endometrial stripe is thin and homogeneous, measuring
mm in width.

The left ovary has a normal size and appearance, measuring 2.3 x
1.4 x 2.2 cm.

The right ovary measures 3.5 x 2.5 x 2.5 cm and contains a 2.6 cm
simple cyst.  There are no adnexal masses or free pelvic fluid.
IMPRESSION: 2.6 cm simple cyst on the right ovary is within normal limits.

## 2012-08-27 ENCOUNTER — Emergency Department (HOSPITAL_COMMUNITY): Payer: Medicaid Other

## 2012-08-27 ENCOUNTER — Inpatient Hospital Stay (HOSPITAL_COMMUNITY)
Admission: EM | Admit: 2012-08-27 | Discharge: 2012-08-31 | DRG: 812 | Disposition: A | Discharge status: Home / Self Care | Payer: Medicaid Other | Attending: Internal Medicine | Admitting: Internal Medicine

## 2012-08-27 ENCOUNTER — Encounter (HOSPITAL_COMMUNITY): Payer: Self-pay | Admitting: *Deleted

## 2012-08-27 ENCOUNTER — Inpatient Hospital Stay (HOSPITAL_COMMUNITY): Payer: Medicaid Other

## 2012-08-27 DIAGNOSIS — M79651 Pain in right thigh: Secondary | ICD-10-CM

## 2012-08-27 DIAGNOSIS — E663 Overweight: Secondary | ICD-10-CM | POA: Diagnosis present

## 2012-08-27 DIAGNOSIS — R51 Headache: Secondary | ICD-10-CM

## 2012-08-27 DIAGNOSIS — F32A Depression, unspecified: Secondary | ICD-10-CM

## 2012-08-27 DIAGNOSIS — R197 Diarrhea, unspecified: Secondary | ICD-10-CM | POA: Diagnosis present

## 2012-08-27 DIAGNOSIS — R112 Nausea with vomiting, unspecified: Secondary | ICD-10-CM

## 2012-08-27 DIAGNOSIS — Z79899 Other long term (current) drug therapy: Secondary | ICD-10-CM

## 2012-08-27 DIAGNOSIS — F439 Reaction to severe stress, unspecified: Secondary | ICD-10-CM

## 2012-08-27 DIAGNOSIS — D571 Sickle-cell disease without crisis: Secondary | ICD-10-CM

## 2012-08-27 DIAGNOSIS — F3289 Other specified depressive episodes: Secondary | ICD-10-CM | POA: Diagnosis present

## 2012-08-27 DIAGNOSIS — G43909 Migraine, unspecified, not intractable, without status migrainosus: Secondary | ICD-10-CM | POA: Diagnosis present

## 2012-08-27 DIAGNOSIS — F329 Major depressive disorder, single episode, unspecified: Secondary | ICD-10-CM | POA: Diagnosis present

## 2012-08-27 DIAGNOSIS — R4586 Emotional lability: Secondary | ICD-10-CM

## 2012-08-27 DIAGNOSIS — G8929 Other chronic pain: Secondary | ICD-10-CM | POA: Diagnosis present

## 2012-08-27 DIAGNOSIS — R000 Tachycardia, unspecified: Secondary | ICD-10-CM | POA: Diagnosis present

## 2012-08-27 DIAGNOSIS — M87029 Idiopathic aseptic necrosis of unspecified humerus: Secondary | ICD-10-CM | POA: Diagnosis present

## 2012-08-27 DIAGNOSIS — F6389 Other impulse disorders: Secondary | ICD-10-CM | POA: Diagnosis present

## 2012-08-27 DIAGNOSIS — K219 Gastro-esophageal reflux disease without esophagitis: Secondary | ICD-10-CM | POA: Diagnosis present

## 2012-08-27 DIAGNOSIS — F633 Trichotillomania: Secondary | ICD-10-CM | POA: Diagnosis present

## 2012-08-27 DIAGNOSIS — D5700 Hb-SS disease with crisis, unspecified: Principal | ICD-10-CM | POA: Diagnosis present

## 2012-08-27 DIAGNOSIS — M549 Dorsalgia, unspecified: Secondary | ICD-10-CM | POA: Diagnosis present

## 2012-08-27 DIAGNOSIS — F17200 Nicotine dependence, unspecified, uncomplicated: Secondary | ICD-10-CM | POA: Diagnosis present

## 2012-08-27 DIAGNOSIS — D72829 Elevated white blood cell count, unspecified: Secondary | ICD-10-CM | POA: Diagnosis present

## 2012-08-27 LAB — COMPREHENSIVE METABOLIC PANEL
ALT: 38 U/L — ABNORMAL HIGH (ref 0–35)
Albumin: 3.9 g/dL (ref 3.5–5.2)
Alkaline Phosphatase: 63 U/L (ref 39–117)
Chloride: 105 mEq/L (ref 96–112)
Potassium: 3.9 mEq/L (ref 3.5–5.1)
Sodium: 139 mEq/L (ref 135–145)
Total Protein: 7.4 g/dL (ref 6.0–8.3)

## 2012-08-27 LAB — CBC WITH DIFFERENTIAL/PLATELET
Basophils Relative: 0 % (ref 0–1)
Eosinophils Absolute: 0.2 10*3/uL (ref 0.0–0.7)
Lymphs Abs: 0.8 10*3/uL (ref 0.7–4.0)
MCH: 31.3 pg (ref 26.0–34.0)
MCHC: 35.5 g/dL (ref 30.0–36.0)
Neutro Abs: 11.6 10*3/uL — ABNORMAL HIGH (ref 1.7–7.7)
Neutrophils Relative %: 85 % — ABNORMAL HIGH (ref 43–77)
Platelets: 444 10*3/uL — ABNORMAL HIGH (ref 150–400)
RBC: 3.29 MIL/uL — ABNORMAL LOW (ref 3.87–5.11)

## 2012-08-27 LAB — PROTIME-INR
INR: 1.1 (ref 0.00–1.49)
Prothrombin Time: 14.1 seconds (ref 11.6–15.2)

## 2012-08-27 LAB — POCT PREGNANCY, URINE: Preg Test, Ur: NEGATIVE

## 2012-08-27 MED ORDER — SODIUM CHLORIDE 0.9 % IV BOLUS (SEPSIS): 1000.0000 mL | Freq: Once | INTRAVENOUS | Status: DC

## 2012-08-27 MED ORDER — SENNA 8.6 MG PO TABS
1.0000 | ORAL_TABLET | Freq: Two times a day (BID) | ORAL | Status: DC
  Administered 2012-08-27 – 2012-08-29 (×4): 8.6 mg via ORAL
  Filled 2012-08-27 (×6): qty 1

## 2012-08-27 MED ORDER — ALUM & MAG HYDROXIDE-SIMETH 200-200-20 MG/5ML PO SUSP: 30.0000 mL | Freq: Four times a day (QID) | ORAL | Status: DC | PRN

## 2012-08-27 MED ORDER — POLYETHYLENE GLYCOL 3350 17 G PO PACK
17.0000 g | PACK | Freq: Every day | ORAL | Status: DC | PRN
  Filled 2012-08-27: qty 1

## 2012-08-27 MED ORDER — ONDANSETRON 4 MG PO TBDP
4.0000 mg | ORAL_TABLET | Freq: Once | ORAL | Status: AC
  Administered 2012-08-27: 4 mg via ORAL
  Filled 2012-08-27: qty 1

## 2012-08-27 MED ORDER — HYDROXYUREA 500 MG PO CAPS
1000.0000 mg | ORAL_CAPSULE | Freq: Every day | ORAL | Status: DC
  Administered 2012-08-27 – 2012-08-31 (×5): 1000 mg via ORAL
  Filled 2012-08-27 (×5): qty 2

## 2012-08-27 MED ORDER — ONDANSETRON HCL 4 MG PO TABS: 4.0000 mg | ORAL_TABLET | Freq: Four times a day (QID) | ORAL | Status: DC | PRN

## 2012-08-27 MED ORDER — ENOXAPARIN SODIUM 40 MG/0.4ML ~~LOC~~ SOLN
40.0000 mg | SUBCUTANEOUS | Status: DC
  Administered 2012-08-27 – 2012-08-29 (×2): 40 mg via SUBCUTANEOUS
  Filled 2012-08-27 (×5): qty 0.4

## 2012-08-27 MED ORDER — HYDROMORPHONE HCL PF 2 MG/ML IJ SOLN
2.0000 mg | Freq: Once | INTRAMUSCULAR | Status: AC
  Administered 2012-08-27: 2 mg via INTRAMUSCULAR
  Filled 2012-08-27: qty 1

## 2012-08-27 MED ORDER — POTASSIUM CHLORIDE IN NACL 20-0.9 MEQ/L-% IV SOLN
INTRAVENOUS | Status: DC
  Administered 2012-08-27 – 2012-08-31 (×8): via INTRAVENOUS
  Filled 2012-08-27 (×11): qty 1000

## 2012-08-27 MED ORDER — HYDROMORPHONE HCL PF 2 MG/ML IJ SOLN
2.0000 mg | INTRAMUSCULAR | Status: DC | PRN
  Administered 2012-08-27 – 2012-08-30 (×24): 2 mg via INTRAVENOUS
  Filled 2012-08-27 (×24): qty 1

## 2012-08-27 MED ORDER — HYDROCODONE-ACETAMINOPHEN 5-325 MG PO TABS
1.0000 | ORAL_TABLET | ORAL | Status: DC | PRN
  Administered 2012-08-30: 2 via ORAL
  Filled 2012-08-27: qty 2

## 2012-08-27 MED ORDER — SODIUM CHLORIDE 0.9 % IV BOLUS (SEPSIS)
1000.0000 mL | Freq: Once | INTRAVENOUS | Status: AC
  Administered 2012-08-27: 1000 mL via INTRAVENOUS

## 2012-08-27 MED ORDER — ALBUTEROL SULFATE (5 MG/ML) 0.5% IN NEBU: 2.5000 mg | INHALATION_SOLUTION | RESPIRATORY_TRACT | Status: DC | PRN

## 2012-08-27 MED ORDER — GUAIFENESIN-DM 100-10 MG/5ML PO SYRP: 5.0000 mL | ORAL_SOLUTION | ORAL | Status: DC | PRN

## 2012-08-27 MED ORDER — HYDROMORPHONE HCL PF 2 MG/ML IJ SOLN
2.0000 mg | INTRAMUSCULAR | Status: DC | PRN
  Administered 2012-08-27 (×4): 2 mg via INTRAVENOUS
  Filled 2012-08-27 (×4): qty 1

## 2012-08-27 MED ORDER — ONDANSETRON HCL 4 MG/2ML IJ SOLN
4.0000 mg | Freq: Four times a day (QID) | INTRAMUSCULAR | Status: DC | PRN
  Administered 2012-08-28 – 2012-08-29 (×3): 4 mg via INTRAVENOUS
  Filled 2012-08-27 (×3): qty 2

## 2012-08-27 MED ORDER — ONDANSETRON HCL 4 MG/2ML IJ SOLN
4.0000 mg | Freq: Once | INTRAMUSCULAR | Status: AC
  Administered 2012-08-27: 4 mg via INTRAVENOUS
  Filled 2012-08-27: qty 2

## 2012-08-27 NOTE — ED Provider Notes (Signed)
Medical screening examination/treatment/procedure(s) were performed by non-physician practitioner and as supervising physician I was immediately available for consultation/collaboration. Devoria Albe, MD, Armando Gang   Ward Givens, MD 08/27/12 [phone]

## 2012-08-27 NOTE — ED Provider Notes (Signed)
History     CSN: [id number]  Arrival date & time 08/27/12  7829   First MD Initiated Contact with Patient 08/27/12 1156      Chief Complaint  Patient presents with  . Sickle Cell Pain Crisis  . Nausea    (Consider location/radiation/quality/duration/timing/severity/associated sxs/prior treatment) HPI  22 year old female with history of sickle cell anemia presents complaining of sickle cell related pain. Patient reports she developed her usual sickle cell related pain since yesterday. Describe pain as a sharp and throbbing sensation worsening at her right shoulder. Pain is severe causing her to have nausea and 2 bouts of nonbloody non-vomitus. She relates her pain to the change in weather. She has tried taking her home medication but unable to keep it down. She denies fever, chills, chest pain, shortness of breath, cough, hemoptysis, abdominal pain, or rash. She has no other complaints.  Past Medical History  Diagnosis Date  . H/O: 1 miscarriage 03/22/2011  . 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 Depression 01/06/2011  . GERD (gastroesophageal reflux disease) 02/17/2011  . Trichotillomania     h/o  . Blood transfusion     "lots"  . Sickle cell anemia with crisis   . Exertional dyspnea     "sometimes"  . Sickle cell anemia   . Headache   . Migraines 11/08/11    "@ least twice/month"  . Chronic back pain     "very severe; have knot in my back; from tight muscle; take RX and exercise for it"  . Mood swings 11/08/11    "I go back and forth; real bad"  . Pregnancy   . Blood dyscrasia     SICKLE CELL    Past Surgical History  Procedure Laterality Date  . Cholecystectomy  05/2010  . Dilation and curettage of uterus  02/20/11    S/P miscarriage    Family History  Problem Relation Age of Onset  . Diabetes Mother   . Alcoholism    . Depression    . Hypercholesterolemia    . Hypertension    . Migraines      History  Substance Use Topics  . Smoking status: Former Smoker    Quit  date: 06/17/2011  . Smokeless tobacco: Never Used  . Alcohol Use: No     Comment: pt states she quit in May    OB History   Grav Para Term Preterm Abortions TAB SAB Ect Mult Living   2    1  1         Obstetric Comments   Miscarried in October 2012 at about 7 weeks      Review of Systems  Constitutional:       10 Systems reviewed and all are negative for acute change except as noted in the HPI.     Allergies  Carrot oil; Latex; and Toradol  Home Medications   Current Outpatient Rx  Name  Route  Sig  Dispense  Refill  . hydroxyurea (HYDREA) 500 MG capsule   Oral   Take 1,000 mg by mouth daily. May take with food to minimize GI side effects.         Marland Kitchen oxyCODONE (ROXICODONE) 15 MG immediate release tablet   Oral   Take 15 mg by mouth every 4 (four) hours as needed. For pain.         Marland Kitchen senna (SENOKOT) 8.6 MG TABS   Oral   Take 1 tablet (8.6 mg total) by mouth 2 (two) times daily.   120  each        BP 110/57  Pulse 104  Temp(Src) 98.4 F (36.9 C) (Oral)  Resp 18  SpO2 97%  LMP 08/12/2012  Physical Exam  Nursing note and vitals reviewed. Constitutional: She is oriented to person, place, and time. She appears well-developed and well-nourished. No distress.  Awake, alert, nontoxic appearance  HENT:  Head: Atraumatic.  Eyes: Conjunctivae are normal. Right eye exhibits no discharge. Left eye exhibits no discharge.  Neck: Neck supple.  Cardiovascular: Normal rate and regular rhythm.   Pulmonary/Chest: Effort normal. No respiratory distress. She exhibits no tenderness.  Abdominal: Soft. There is no tenderness. There is no rebound.  Musculoskeletal: She exhibits tenderness (Diffuse tenderness to posterior aspects of right shoulder with normal range of motion, no deformity, and no rash.). She exhibits no edema.  ROM appears intact, no obvious focal weakness  Neurological: She is alert and oriented to person, place, and time.  Mental status and motor strength  appears intact  Skin: No rash noted.  Psychiatric: She has a normal mood and affect.    ED Course  Procedures (including critical care time)  12:07 PM Patient with sickle cell related pain today. No complaint concerning for acute chest syndrome. She is afebrile. Workup initiated.  3:39 PM Pt has difficult IV access per nurse.  We finally established IV line on her foot.  Pain medication given.  Will continue to monitor.    5:39 PM Patient has received a total of 3 different doses of pain medication with minimal improvement of her pain. Patient requests for admission for further management of her pain. Will consult for admission for further management of her sickle cell crisis.    5:48 PM I have consulted with [facility], Dr. Bess Harvest who agrees to see and admit pt for further management of her sickle cell related pain.  Pt currently stable.     Labs Reviewed  CBC WITH DIFFERENTIAL - Abnormal; Notable for the following:    WBC 13.6 (*)    RBC 3.29 (*)    Hemoglobin 10.3 (*)    HCT 29.0 (*)    RDW 16.5 (*)    Platelets 444 (*)    Neutrophils Relative 85 (*)    Neutro Abs 11.6 (*)    Lymphocytes Relative 6 (*)    All other components within normal limits  COMPREHENSIVE METABOLIC PANEL - Abnormal; Notable for the following:    BUN 5 (*)    ALT 38 (*)    Total Bilirubin 2.0 (*)    All other components within normal limits  RETICULOCYTES - Abnormal; Notable for the following:    Retic Ct Pct 11.5 (*)    RBC. 3.30 (*)    Retic Count, Manual 379.5 (*)    All other components within normal limits   Dg Chest 2 View  08/27/2012  *RADIOLOGY REPORT*  Clinical Data: Sickle cell disease.  Back pain.  CHEST - 2 VIEW  Comparison: 06/22/2012  Findings: Heart size is normal.  There is vascular plethora without edema.  There is mild perihilar pulmonary scarring as seen previously.  Chronic sclerotic changes of the humeral heads and chronic spinal changes of sickle cell remain evident.   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  Pulmonary scarring. Otherwise no active process.  Chronic bony changes.   Original Report Authenticated By: Paulina Fusi, M.D.      1. Sickle cell pain crisis       MDM  BP 110/57  Pulse 104  Temp(Src) 98.4 F (36.9 C) (Oral)  Resp 18  SpO2 97%  LMP 08/12/2012  I have reviewed nursing notes and vital signs. I personally reviewed the imaging tests through PACS system  I reviewed available ER/hospitalization records thought the EMR         Fayrene Helper, [place] 08/27/12 1749

## 2012-08-27 NOTE — H&P (Addendum)
[facility]                                                                                    Patient Demographics  [name], is a 22 y.o. female  CSN: [id number]  MRN: [id number]  DOB - [birth year]  Admit Date - 08/27/2012  Outpatient Primary MD for the patient is August Saucer, ERIC, MD   With History of -  Past Medical History  Diagnosis Date  . H/O: 1 miscarriage 03/22/2011  . 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 Depression 01/06/2011  . GERD (gastroesophageal reflux disease) 02/17/2011  . Trichotillomania     h/o  . Blood transfusion     "lots"  . Sickle cell anemia with crisis   . Exertional dyspnea     "sometimes"  . Sickle cell anemia   . Headache   . Migraines 11/08/11    "@ least twice/month"  . Chronic back pain     "very severe; have knot in my back; from tight muscle; take RX and exercise for it"  . Mood swings 11/08/11    "I go back and forth; real bad"  . Pregnancy   . Blood dyscrasia     SICKLE CELL      Past Surgical History  Procedure Laterality Date  . Cholecystectomy  05/2010  . Dilation and curettage of uterus  02/20/11    S/P miscarriage    in for   Chief Complaint  Patient presents with  . Sickle Cell Pain Crisis  . Nausea     HPI  [name]  is a 22 y.o. female, with history of sickle cell disease,  migraines, chronic back pain, depression, GERD, trichotillomania, who was recently admitted few weeks ago for sickle cell crisis comes back with a one-day history of right shoulder pain, along with mild nausea and vomiting, she says her right shoulder pain is constant, worse with movement, with rest and pain medications, nonradiating, sharp, associated with some nausea vomiting, she claims she vomited several times yesterday and 2- 3 times today so far, in the ER her workup was suggestive of leukocytosis, mildly elevated total bilirubin, mild tachycardia, she was diagnosed with sickle cell vaso-occlusive crisis and I was called to  admit the patient. Urine pregnancy test is negative.  Of note patient denies any injuries to right shoulder, denies any warmth, swelling around the right shoulder. No fever chills. She denies any previous right shoulder discomfort however her chest x-ray shows chronic sclerotic changes on both humeral heads.  Review of Systems    In addition to the HPI above,   No Fever-chills, No Headache, No changes with Vision or hearing, No problems swallowing food or Liquids, No Chest pain, Cough or Shortness of Breath, No Abdominal pain, positive Nausea or Vommitting, Bowel movements are regular, No Blood in stool or Urine, No dysuria, No new skin rashes or bruises, No new joints pains-aches, except right shoulder pain No new weakness, tingling, numbness in any extremity, No recent weight gain or loss, No polyuria, polydypsia or polyphagia, No significant Mental Stressors.  A full 10 point Review of Systems was done, except as stated above, all other Review of Systems  were negative.   Social History History  Substance Use Topics  . Smoking status: Former Smoker    Quit date: 06/17/2011  . Smokeless tobacco: Never Used  . Alcohol Use: No     Comment: pt states she quit in May      Family History Family History  Problem Relation Age of Onset  . Diabetes Mother   . Alcoholism    . Depression    . Hypercholesterolemia    . Hypertension    . Migraines        Prior to Admission medications   Medication Sig Start Date End Date Taking? Authorizing Provider  hydroxyurea (HYDREA) 500 MG capsule Take 1,000 mg by mouth daily. May take with food to minimize GI side effects.   Yes Historical Provider, MD  oxyCODONE (ROXICODONE) 15 MG immediate release tablet Take 15 mg by mouth every 4 (four) hours as needed. For pain.   Yes Historical Provider, MD  senna (SENOKOT) 8.6 MG TABS Take 1 tablet (8.6 mg total) by mouth 2 (two) times daily. 06/27/12  Yes Sorin Luanne Bras, MD    Allergies  Allergen  Reactions  . Carrot Oil Hives and Swelling  . Latex Rash  . Toradol (Ketorolac Tromethamine) Hives and Itching    Physical Exam  Vitals  Blood pressure 110/57, pulse 104, temperature 98.4 F (36.9 C), temperature source Oral, resp. rate 18, last menstrual period 08/12/2012, SpO2 97.00%.   1. General Young African American female lying in bed in mild discomfort due to right shoulder pain,  2. Normal affect and insight, Not Suicidal or Homicidal, Awake Alert, Oriented X 3.  3. No F.N deficits, ALL C.Nerves Intact, Strength 5/5 all 4 extremities, Sensation intact all 4 extremities, Plantars down going.  4. Ears and Eyes appear Normal, Conjunctivae clear, PERRLA. Moist Oral Mucosa.  5. Supple Neck, No JVD, No cervical lymphadenopathy appriciated, No Carotid Bruits.  6. Symmetrical Chest wall movement, Good air movement bilaterally, CTAB.  7. RRR, No Gallops, Rubs or Murmurs, No Parasternal Heave.  8. Positive Bowel Sounds, Abdomen Soft, Non tender, No organomegaly appriciated,No rebound -guarding or rigidity.  9.  No Cyanosis, Normal Skin Turgor, No Skin Rash or Bruise.  10. Good muscle tone,  joints appear normal , no effusions, Normal ROM. Right shoulder as compared to left unremarkable, good passive range of motion, on palpation no tenderness. No warmth no erythema no effusion.  11. No Palpable Lymph Nodes in Neck or Axillae     Data Review  CBC  Recent Labs Lab 08/27/12 1039  WBC 13.6*  HGB 10.3*  HCT 29.0*  PLT 444*  MCV 88.1  MCH 31.3  MCHC 35.5  RDW 16.5*  LYMPHSABS 0.8  MONOABS 1.0  EOSABS 0.2  BASOSABS 0.0   ------------------------------------------------------------------------------------------------------------------  Chemistries   Recent Labs Lab 08/27/12 1039  NA 139  K 3.9  CL 105  CO2 24  GLUCOSE 95  BUN 5*  CREATININE 0.54  CALCIUM 9.0  AST 34  ALT 38*  ALKPHOS 63  BILITOT 2.0*    ------------------------------------------------------------------------------------------------------------------ CrCl is unknown because both a height and weight (above a minimum accepted value) are required for this calculation. ------------------------------------------------------------------------------------------------------------------ No results found for this basename: TSH, T4TOTAL, FREET3, T3FREE, THYROIDAB,  in the last 72 hours   Coagulation profile No results found for this basename: INR, PROTIME,  in the last 168 hours ------------------------------------------------------------------------------------------------------------------- No results found for this basename: DDIMER,  in the last 72 hours -------------------------------------------------------------------------------------------------------------------  Cardiac Enzymes No  results found for this basename: CK, CKMB, TROPONINI, MYOGLOBIN,  in the last 168 hours ------------------------------------------------------------------------------------------------------------------ No components found with this basename: POCBNP,    ---------------------------------------------------------------------------------------------------------------  Urinalysis    Component Value Date/Time   COLORURINE YELLOW 07/18/2012 1506   APPEARANCEUR CLOUDY* 07/18/2012 1506   LABSPEC 1.042* 07/18/2012 1506   PHURINE 5.0 07/18/2012 1506   GLUCOSEU NEGATIVE 07/18/2012 1506   HGBUR NEGATIVE 07/18/2012 1506   HGBUR negative 01/08/2009 1329   BILIRUBINUR NEGATIVE 07/18/2012 1506   KETONESUR NEGATIVE 07/18/2012 1506   PROTEINUR NEGATIVE 07/18/2012 1506   UROBILINOGEN 0.2 07/18/2012 1506   NITRITE NEGATIVE 07/18/2012 1506   LEUKOCYTESUR NEGATIVE 07/18/2012 1506    ----------------------------------------------------------------------------------------------------------------  Imaging results:   Dg Chest 2 View  08/27/2012  *RADIOLOGY REPORT*  Clinical  Data: Sickle cell disease.  Back pain.  CHEST - 2 VIEW  Comparison: 06/22/2012  Findings: Heart size is normal.  There is vascular plethora without edema.  There is mild perihilar pulmonary scarring as seen previously.  Chronic sclerotic changes of the humeral heads and chronic spinal changes of sickle cell remain evident.  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  Pulmonary scarring. Otherwise no active process.  Chronic bony changes.   Original Report Authenticated By: Paulina Fusi, M.D.          Assessment & Plan     1. Sickle cell vaso-occlusive crisis - causing right-sided shoulder pain and mild nausea vomiting, she really appears to be in no distress upon distraction, examination upon distraction is unremarkable of the right shoulder, she could be in mild sickle cell crisis, she will be admitted to the hospital, IV fluids, oxygen to keep pulse ox over 92%, pain control, get right shoulder x-ray, repeat CBC and CMP in the morning. Her home medications Hydrea will be continued.   2. History of migraines, chronic back pain. Stable, no acute issues outpatient followup with PCP post discharge.   3. Active smoker. Counseled to quit smoking. Nicotine patch.    DVT Prophylaxis  Lovenox    AM Labs Ordered, also please review Full Orders  Family Communication: Admission, patients condition and plan of care including tests being ordered have been discussed with the patient and boyfriend who indicate understanding and agree with the plan and Code Status.  Code Status Full  Likely DC to  Home  Time spent in minutes : 35  Condition fair  Leroy Sea M.D on 08/27/2012 at 5:58 PM  Between 7am to 7pm - Pager - [phone]  After 7pm go to [email or web address] - password [email or web address]  And look for the night coverage person covering me after hours  [facility] Office  [phone]

## 2012-08-27 NOTE — ED Notes (Signed)
Pt states started having nausea/vomiting yesterday, then around midnight last night started having sickle cell crisis, complaining of R shoulder pain.

## 2012-08-27 NOTE — ED Notes (Signed)
Report given to the floor RN Huntley Dec. Will transfer to the floor.

## 2012-08-28 DIAGNOSIS — M87029 Idiopathic aseptic necrosis of unspecified humerus: Secondary | ICD-10-CM | POA: Diagnosis present

## 2012-08-28 LAB — COMPREHENSIVE METABOLIC PANEL
Albumin: 3.6 g/dL (ref 3.5–5.2)
BUN: 5 mg/dL — ABNORMAL LOW (ref 6–23)
Creatinine, Ser: 0.55 mg/dL (ref 0.50–1.10)
Potassium: 3.9 mEq/L (ref 3.5–5.1)
Total Protein: 6.8 g/dL (ref 6.0–8.3)

## 2012-08-28 LAB — CBC
HCT: 25.3 % — ABNORMAL LOW (ref 36.0–46.0)
MCHC: 35.2 g/dL (ref 30.0–36.0)
MCV: 87.5 fL (ref 78.0–100.0)
RDW: 16.6 % — ABNORMAL HIGH (ref 11.5–15.5)

## 2012-08-28 LAB — TSH: TSH: 0.407 u[IU]/mL (ref 0.350–4.500)

## 2012-08-28 LAB — DIFFERENTIAL
Basophils Absolute: 0.1 10*3/uL (ref 0.0–0.1)
Basophils Relative: 0 % (ref 0–1)
Neutro Abs: 12.7 10*3/uL — ABNORMAL HIGH (ref 1.7–7.7)
Neutrophils Relative %: 77 % (ref 43–77)

## 2012-08-28 MED ORDER — DIPHENHYDRAMINE HCL 50 MG/ML IJ SOLN
12.5000 mg | Freq: Once | INTRAMUSCULAR | Status: AC
  Administered 2012-08-28: 12.5 mg via INTRAVENOUS
  Filled 2012-08-28: qty 1

## 2012-08-28 MED ORDER — DIPHENHYDRAMINE HCL 25 MG PO CAPS: 25.0000 mg | ORAL_CAPSULE | Freq: Four times a day (QID) | ORAL | Status: DC | PRN

## 2012-08-28 MED ORDER — DIPHENHYDRAMINE HCL 50 MG/ML IJ SOLN
25.0000 mg | INTRAMUSCULAR | Status: DC | PRN
  Filled 2012-08-28: qty 0.5

## 2012-08-28 NOTE — Progress Notes (Signed)
SICKLE CELL SERVICE PROGRESS NOTE  [name] ZOX:[id number] DOB: [birth year] DOA: 08/27/2012 PCP: August Saucer ERIC, MD  Assessment/Plan: Active Problems: Sickle cell pain crisis: Pt states that her pain is localized to her shoulder and back. Will continue current regimen and if able to tolerate diet, will transition to oral medications and oral benadryl for itching. Continue IVF. Pt allergic to Toradol.  Nausea and vomiting: Pt states that her nausea is almost resolved and she feels that she is able to tolerate a proper meal. Will advance diet to regular meal.   Chronic back pain: Pt continues to have back pain but feels that there is an exacerbation of her pain related to acute VOC.      Leukocytosis: Pt has no indication of infection. Likely related to VOC. Will continue to monitor.      Code Status: Full Code Family Communication: N/A  Disposition Plan: Home at discharge  [name] A.  Pager [phone]. If 7PM-7AM, please contact night-coverage.  08/28/2012, 5:26 PM  LOS: 1 day   Brief narrative: [name] is a 22 y.o. female, with history of sickle cell disease, migraines, chronic back pain, depression, GERD, trichotillomania, who was recently admitted few weeks ago for sickle cell crisis comes back with a one-day history of right shoulder pain, along with mild nausea and vomiting, she says her right shoulder pain is constant, worse with movement, with rest and pain medications, nonradiating, sharp, associated with some nausea vomiting, she claims she vomited several times yesterday and 2- 3 times today so far, in the ER her workup was suggestive of leukocytosis, mildly elevated total bilirubin, mild tachycardia, she was diagnosed with sickle cell vaso-occlusive crisis and I was called to admit the patient. Urine pregnancy test is negative.  Of note patient denies any injuries to right shoulder, denies any warmth, swelling around the right shoulder. No fever chills. She  denies any previous right shoulder discomfort however her chest x-ray shows chronic sclerotic changes on both humeral heads.   Consultants:  None  Procedures:  None  Antibiotics:  None  HPI/Subjective: Pt localizes pain to B/L shoulders and low back. Rates pain as 8/10. No further nausea and vomiting.  Objective: Filed Vitals:   08/28/12 0215 08/28/12 0521 08/28/12 1005 08/28/12 1420  BP: 121/83 121/70 112/44 112/72  Pulse: 112 107 108 105  Temp: 98.6 F (37 C) 98.7 F (37.1 C) 98.3 F (36.8 C) 98.7 F (37.1 C)  TempSrc: Oral Oral Oral Oral  Resp: 16 15 16 16   Height:      Weight:      SpO2: 98% 93% 96% 96%   Weight change:   Intake/Output Summary (Last 24 hours) at 08/28/12 1726 Last data filed at 08/28/12 0521  Gross per 24 hour  Intake    360 ml  Output      0 ml  Net    360 ml    General: Alert, awake, oriented x3, in no acute distress. Well appearing. HEENT: [place]/AT PEERL, EOMI Heart: Regular rate and rhythm, without murmurs, rubs, gallops, PMI non-displaced, no heaves or thrills on palpation.  Lungs: Clear to auscultation, no wheezing or rhonchi noted.  Abdomen: Soft, nontender, nondistended, positive bowel sounds, no masses no hepatosplenomegaly noted..  Neuro: No focal neurological deficits noted cranial nerves II through XII grossly intact. Musculoskeletal: No warm swelling or erythema around joints, no spinal tenderness noted. Psychiatric: Patient alert and oriented x3, good insight and cognition, good recent to remote recall  Data Reviewed: Basic Metabolic  Panel:  Recent Labs Lab 08/27/12 1039 08/28/12 0408  NA 139 136  K 3.9 3.9  CL 105 103  CO2 24 23  GLUCOSE 95 93  BUN 5* 5*  CREATININE 0.54 0.55  CALCIUM 9.0 8.3*   Liver Function Tests:  Recent Labs Lab 08/27/12 1039 08/28/12 0408  AST 34 40*  ALT 38* 35  ALKPHOS 63 54  BILITOT 2.0* 3.6*  PROT 7.4 6.8  ALBUMIN 3.9 3.6   No results found for this basename: LIPASE, AMYLASE,   in the last 168 hours No results found for this basename: AMMONIA,  in the last 168 hours CBC:  Recent Labs Lab 08/27/12 1039 08/28/12 0408  WBC 13.6* 16.8*  NEUTROABS 11.6* 12.7*  HGB 10.3* 8.9*  HCT 29.0* 25.3*  MCV 88.1 87.5  PLT 444* 385   Cardiac Enzymes: No results found for this basename: CKTOTAL, CKMB, CKMBINDEX, TROPONINI,  in the last 168 hours BNP (last 3 results)  Recent Labs  10/25/11 0337 10/26/11 0720 11/21/11 1220  PROBNP 41.7 29.9 16.2   CBG: No results found for this basename: GLUCAP,  in the last 168 hours  No results found for this or any previous visit (from the past 240 hour(s)).   Studies: Dg Chest 2 View  08/27/2012  *RADIOLOGY REPORT*  Clinical Data: Sickle cell disease.  Back pain.  CHEST - 2 VIEW  Comparison: 06/22/2012  Findings: Heart size is normal.  There is vascular plethora without edema.  There is mild perihilar pulmonary scarring as seen previously.  Chronic sclerotic changes of the humeral heads and chronic spinal changes of sickle cell remain evident.  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  Pulmonary scarring. Otherwise no active process.  Chronic bony changes.   Original Report Authenticated By: Paulina Fusi, M.D.    Dg Shoulder Right  08/27/2012  *RADIOLOGY REPORT*  Clinical Data: Right shoulder pain.  Sickle cell  RIGHT SHOULDER - 2+ VIEW  Comparison: 06/22/2012  Findings: Sclerosis of the superior aspect of the humeral head is unchanged.  No fracture.  Normal alignment and no significant degenerative change.  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  Negative for fracture.   Original Report Authenticated By: Janeece Riggers, M.D.     Scheduled Meds: . enoxaparin (LOVENOX) injection  40 mg Subcutaneous Q24H  . hydroxyurea  1,000 mg Oral Daily  . senna  1 tablet Oral BID   Continuous Infusions: . 0.9 % NaCl with KCl 20 mEq / L 100 mL/hr at 08/28/12 0741    Time spent 40  minutes.

## 2012-08-28 NOTE — Progress Notes (Signed)
Pt arrived to floor complaining of pain 8/10. Pt is not SOB or experiencing any dyspnea at this time. Will continue to monitor.

## 2012-08-28 NOTE — Care Management (Signed)
CARE MANAGEMENT NOTE 08/28/2012  Patient:  HAYLI, MILLIGAN   Account Number:  [id number]  Date Initiated:  08/28/2012  Documentation initiated by:  Doree Kuehne  Subjective/Objective Assessment:   22 yo female admitted with vaso-occlusive crisis. PTA pt independent. PCP-Dr.Dean     Action/Plan:   Home when stable   Anticipated DC Date:     Anticipated DC Plan:  HOME/SELF CARE  In-house referral  NA      DC Planning Services  CM consult      Choice offered to / List presented to:  NA   DME arranged  NA      DME agency  NA     HH arranged  NA      HH agency  NA   Status of service:  In process, will continue to follow Medicare Important Message given?   (If response is "NO", the following Medicare IM given date fields will be blank) Date Medicare IM given:   Date Additional Medicare IM given:    Discharge Disposition:    Per UR Regulation:  Reviewed for med. necessity/level of care/duration of stay  If discussed at Long Length of Stay Meetings, dates discussed:    Comments:  08/28/12 1311 Teasia Zapf,RN,BSN [phone] Chart reviewed for medication necessity/level of care review. No HH services or DME needs assessed at this time.

## 2012-08-28 NOTE — Progress Notes (Signed)
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 I informed the pt that while she is under medication it is not safe for her to be taking care of a child. Pt was understanding and is calling her boyfriend to come and relieve he of the baby. I informed the pt that for the safety of her and the baby, she cannot receive any more narcotics or benadryl until her boyfriend arrives. Pt was agreeable. Will continue to monitor.

## 2012-08-29 DIAGNOSIS — D5700 Hb-SS disease with crisis, unspecified: Secondary | ICD-10-CM

## 2012-08-29 LAB — COMPREHENSIVE METABOLIC PANEL
AST: 39 U/L — ABNORMAL HIGH (ref 0–37)
Albumin: 3.4 g/dL — ABNORMAL LOW (ref 3.5–5.2)
Chloride: 103 mEq/L (ref 96–112)
Creatinine, Ser: 0.52 mg/dL (ref 0.50–1.10)
Total Bilirubin: 3.4 mg/dL — ABNORMAL HIGH (ref 0.3–1.2)

## 2012-08-29 LAB — CBC WITH DIFFERENTIAL/PLATELET
Basophils Relative: 1 % (ref 0–1)
Eosinophils Relative: 2 % (ref 0–5)
Hemoglobin: 8.1 g/dL — ABNORMAL LOW (ref 12.0–15.0)
Lymphs Abs: 2.2 10*3/uL (ref 0.7–4.0)
MCH: 30.2 pg (ref 26.0–34.0)
MCV: 87.3 fL (ref 78.0–100.0)
Monocytes Absolute: 1.2 10*3/uL — ABNORMAL HIGH (ref 0.1–1.0)
Platelets: 322 10*3/uL (ref 150–400)
RBC: 2.68 MIL/uL — ABNORMAL LOW (ref 3.87–5.11)

## 2012-08-29 MED ORDER — BACID PO TABS
2.0000 | ORAL_TABLET | Freq: Three times a day (TID) | ORAL | Status: DC
  Administered 2012-08-29 – 2012-08-31 (×6): 2 via ORAL
  Filled 2012-08-29 (×9): qty 2

## 2012-08-29 NOTE — Progress Notes (Signed)
Subjective: 22 year old female admitted with sickle cell painful crisis. Patient has felt better today. Her main complaint is some joint pains at 5/10. She's also having problems that whenever she has flatus she sees to coming out with it. She's worried that she is having incontinence. She status and is before. He denies shortness of breath. No cough no fever no chills. She has been able to move around. Also no urinary incontinence. She has been able to tolerate her breakfast so far without vomiting.  Objective: Vital signs in last 24 hours: Temp:  [98.1 F (36.7 C)-99 F (37.2 C)] 98.9 F (37.2 C) (04/16 0924) Pulse Rate:  [92-107] 107 (04/16 0924) Resp:  [16] 16 (04/16 0924) BP: (99-120)/(53-79) 114/79 mmHg (04/16 0924) SpO2:  [93 %-98 %] 97 % (04/16 0924) Weight:  [85.594 kg (188 lb 11.2 oz)] 85.594 kg (188 lb 11.2 oz) (04/16 0712) Weight change:  Last BM Date: 08/28/12  Intake/Output from previous day: 04/15 0701 - 04/16 0700 In: 540 [P.O.:540] Out: -  Intake/Output this shift: Total I/O In: 360 [P.O.:360] Out: -   General appearance: alert, cooperative, fatigued and mild distress Eyes: conjunctivae/corneas clear. PERRL, EOM's intact. Fundi benign. Neck: no adenopathy, no carotid bruit, no JVD, supple, symmetrical, trachea midline and thyroid not enlarged, symmetric, no tenderness/mass/nodules Resp: clear to auscultation bilaterally Cardio: regular rate and rhythm, S1, S2 normal, no murmur, click, rub or gallop GI: soft, non-tender; bowel sounds normal; no masses,  no organomegaly Extremities: extremities normal, atraumatic, no cyanosis or edema Pulses: 2+ and symmetric Skin: Skin color, texture, turgor normal. No rashes or lesions Neurologic: Grossly normal  Lab Results:  Recent Labs  08/28/12 0408 08/29/12 0353  WBC 16.8* 12.0*  HGB 8.9* 8.1*  HCT 25.3* 23.4*  PLT 385 322   BMET  Recent Labs  08/28/12 0408 08/29/12 0353  NA 136 137  K 3.9 3.6  CL 103 103   CO2 23 25  GLUCOSE 93 94  BUN 5* 4*  CREATININE 0.55 0.52  CALCIUM 8.3* 8.6    Studies/Results: Dg Chest 2 View  08/27/2012  *RADIOLOGY REPORT*  Clinical Data: Sickle cell disease.  Back pain.  CHEST - 2 VIEW  Comparison: 06/22/2012  Findings: Heart size is normal.  There is vascular plethora without edema.  There is mild perihilar pulmonary scarring as seen previously.  Chronic sclerotic changes of the humeral heads and chronic spinal changes of sickle cell remain evident.  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  Pulmonary scarring. Otherwise no active process.  Chronic bony changes.   Original Report Authenticated By: Paulina Fusi, M.D.    Dg Shoulder Right  08/27/2012  *RADIOLOGY REPORT*  Clinical Data: Right shoulder pain.  Sickle cell  RIGHT SHOULDER - 2+ VIEW  Comparison: 06/22/2012  Findings: Sclerosis of the superior aspect of the humeral head is unchanged.  No fracture.  Normal alignment and no significant degenerative change.  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  Negative for fracture.   Original Report Authenticated By: Janeece Riggers, M.D.     Medications: I have reviewed the patient's current medications.  Assessment/Plan: 22 year old with sickle cell painful crisis. Also mild stool incontinence.  #1 sickle cell painful crisis: Patient is on her home regimen. She seems to be doing fine with it. She also has IV Dilaudid when necessary. We will keep on this regimen and hopefully be able to get her home by tomorrow.  #2 stool incontinence: Most likely secondary to narcotic use,  mild diarrhea, or he could also reflect some irritable bowel. Place patient on some probiotics and Lomotil if symptoms do not get better.  #3 nausea and vomiting: Seems to have resolved. We'll continue advancing diet as tolerated.  #4 depression: patient has been counseled.  #5 tobacco abuse: Again patient has been counseled against smoking.  #6  disposition: Patient will likely be discharged in the morning if her symptoms improve.  LOS: 2 days   Nike Southers,LAWAL 08/29/2012, 10:25 AM

## 2012-08-30 LAB — DIFFERENTIAL
Basophils Relative: 1 % (ref 0–1)
Eosinophils Absolute: 0.4 10*3/uL (ref 0.0–0.7)
Monocytes Absolute: 1.8 10*3/uL — ABNORMAL HIGH (ref 0.1–1.0)
Monocytes Relative: 11 % (ref 3–12)

## 2012-08-30 LAB — CBC
HCT: 22.9 % — ABNORMAL LOW (ref 36.0–46.0)
Hemoglobin: 8.1 g/dL — ABNORMAL LOW (ref 12.0–15.0)
MCH: 31.2 pg (ref 26.0–34.0)
MCHC: 35.4 g/dL (ref 30.0–36.0)
MCV: 88.1 fL (ref 78.0–100.0)

## 2012-08-30 MED ORDER — KETOROLAC TROMETHAMINE 15 MG/ML IJ SOLN
INTRAMUSCULAR | Status: AC
  Administered 2012-08-30: 15 mg via INTRAVENOUS
  Filled 2012-08-30: qty 1

## 2012-08-30 MED ORDER — HYDROMORPHONE HCL PF 1 MG/ML IJ SOLN
1.0000 mg | INTRAMUSCULAR | Status: DC | PRN
  Administered 2012-08-30 – 2012-08-31 (×10): 1 mg via INTRAVENOUS
  Filled 2012-08-30 (×10): qty 1

## 2012-08-30 MED ORDER — KETOROLAC TROMETHAMINE 15 MG/ML IJ SOLN: 15.0000 mg | Freq: Once | INTRAMUSCULAR | Status: AC

## 2012-08-30 MED ORDER — KETOROLAC TROMETHAMINE 30 MG/ML IJ SOLN
30.0000 mg | Freq: Four times a day (QID) | INTRAMUSCULAR | Status: DC
  Administered 2012-08-30 – 2012-08-31 (×2): 30 mg via INTRAVENOUS
  Filled 2012-08-30 (×6): qty 1

## 2012-08-30 MED ORDER — BACID PO TABS: 2.0000 | ORAL_TABLET | Freq: Three times a day (TID) | ORAL | Status: DC

## 2012-08-30 NOTE — Progress Notes (Signed)
Pt's pulse ox has been in high 90s on RA since she received toradol IV at 1402. Pt has had no rash, hives, SOB, or itching since receiving medication. Dr. Ashley Royalty is agreeable to discontinuing continuous pulse ox. Will continue to monitor.

## 2012-08-30 NOTE — Progress Notes (Signed)
SICKLE CELL SERVICE PROGRESS NOTE  [name] ZOX:[id number] DOB: Sep 23, 1990 DOA: 08/27/2012 PCP: Daven Pinckney A., MD  Assessment/Plan: Active Problems: Sickle cell pain crisis: Pt states that her pain is localized to her joints. Pt states that the reaction that she had to toradol was a rash. I will give a trial of Toradol 15 mg. If no adverse reaction will add Toradol on a scheduled to treat inflammation. Continue oral medications and decrease IV bolus dosese.  Nausea and vomiting: Resolved.   Chronic back pain: Pt continues to have back pain but feels that there is an exacerbation of her pain related to acute VOC.      Leukocytosis: Pt has no indication of infection. Likely related to VOC. Will continue to monitor. Expect to see improvement with Toradol.    Code Status: Full Code Family Communication: N/A  Disposition Plan: Home at discharge  Darshay Deupree A.  Pager [phone]. If 7PM-7AM, please contact night-coverage.  08/30/2012, 4:00 PM  LOS: 3 days   Brief narrative: [name] is a 22 y.o. female, with history of sickle cell disease, migraines, chronic back pain, depression, GERD, trichotillomania, who was recently admitted few weeks ago for sickle cell crisis comes back with a one-day history of right shoulder pain, along with mild nausea and vomiting, she says her right shoulder pain is constant, worse with movement, with rest and pain medications, nonradiating, sharp, associated with some nausea vomiting, she claims she vomited several times yesterday and 2- 3 times today so far, in the ER her workup was suggestive of leukocytosis, mildly elevated total bilirubin, mild tachycardia, she was diagnosed with sickle cell vaso-occlusive crisis and I was called to admit the patient. Urine pregnancy test is negative.  Of note patient denies any injuries to right shoulder, denies any warmth, swelling around the right shoulder. No fever chills. She denies any previous  right shoulder discomfort however her chest x-ray shows chronic sclerotic changes on both humeral heads.   Consultants:  None  Procedures:  None  Antibiotics:  None  HPI/Subjective: Pt localizes pain to B/L shoulders. Rates pain as 6-7/10. No further nausea and vomiting.  Objective: Filed Vitals:   08/29/12 1332 08/29/12 2119 08/30/12 0140 08/30/12 0540  BP: 102/70 112/59 98/56 105/57  Pulse: 103 90 89 91  Temp: 98.7 F (37.1 C) 98.8 F (37.1 C) 99.3 F (37.4 C) 98.3 F (36.8 C)  TempSrc: Oral Oral Oral Oral  Resp: 16 15 16 16   Height:      Weight:    84.55 kg (186 lb 6.4 oz)  SpO2: 99% 93% 96% 99%   Weight change:   Intake/Output Summary (Last 24 hours) at 08/30/12 1600 Last data filed at 08/30/12 0600  Gross per 24 hour  Intake   2080 ml  Output      0 ml  Net   2080 ml    General: Alert, awake, oriented x3, in no acute distress. Well appearing. HEENT: [place]/AT PEERL, EOMI, anicteric Heart: Regular rate and rhythm, without murmurs, rubs, gallops, PMI non-displaced, no heaves or thrills on palpation.  Lungs: Clear to auscultation, no wheezing or rhonchi noted.  Abdomen: Soft, nontender, nondistended, positive bowel sounds, no masses no hepatosplenomegaly noted..  Neuro: No focal neurological deficits noted cranial nerves II through XII grossly intact.  Musculoskeletal: No warm swelling or erythema around joints, no spinal tenderness noted. Psychiatric: Patient alert and oriented x3, good insight and cognition, good recent to remote recall  Data Reviewed: Basic Metabolic Panel:  Recent Labs  Lab 08/27/12 1039 08/28/12 0408 08/29/12 0353  NA 139 136 137  K 3.9 3.9 3.6  CL 105 103 103  CO2 24 23 25   GLUCOSE 95 93 94  BUN 5* 5* 4*  CREATININE 0.54 0.55 0.52  CALCIUM 9.0 8.3* 8.6   Liver Function Tests:  Recent Labs Lab 08/27/12 1039 08/28/12 0408 08/29/12 0353  AST 34 40* 39*  ALT 38* 35 38*  ALKPHOS 63 54 55  BILITOT 2.0* 3.6* 3.4*  PROT 7.4  6.8 6.6  ALBUMIN 3.9 3.6 3.4*   No results found for this basename: LIPASE, AMYLASE,  in the last 168 hours No results found for this basename: AMMONIA,  in the last 168 hours CBC:  Recent Labs Lab 08/27/12 1039 08/28/12 0408 08/29/12 0353 08/30/12 0351  WBC 13.6* 16.8* 12.0* 15.8*  NEUTROABS 11.6* 12.7* 8.3* 10.4*  HGB 10.3* 8.9* 8.1* 8.1*  HCT 29.0* 25.3* 23.4* 22.9*  MCV 88.1 87.5 87.3 88.1  PLT 444* 385 322 355   Cardiac Enzymes: No results found for this basename: CKTOTAL, CKMB, CKMBINDEX, TROPONINI,  in the last 168 hours BNP (last 3 results)  Recent Labs  10/25/11 0337 10/26/11 0720 11/21/11 1220  PROBNP 41.7 29.9 16.2   CBG: No results found for this basename: GLUCAP,  in the last 168 hours  No results found for this or any previous visit (from the past 240 hour(s)).   Studies: Dg Chest 2 View  08/27/2012  *RADIOLOGY REPORT*  Clinical Data: Sickle cell disease.  Back pain.  CHEST - 2 VIEW  Comparison: 06/22/2012  Findings: Heart size is normal.  There is vascular plethora without edema.  There is mild perihilar pulmonary scarring as seen previously.  Chronic sclerotic changes of the humeral heads and chronic spinal changes of sickle cell remain evident.  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  Pulmonary scarring. Otherwise no active process.  Chronic bony changes.   Original Report Authenticated By: Paulina Fusi, M.D.    Dg Shoulder Right  08/27/2012  *RADIOLOGY REPORT*  Clinical Data: Right shoulder pain.  Sickle cell  RIGHT SHOULDER - 2+ VIEW  Comparison: 06/22/2012  Findings: Sclerosis of the superior aspect of the humeral head is unchanged.  No fracture.  Normal alignment and no significant degenerative change.  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  Negative for fracture.   Original Report Authenticated By: Janeece Riggers, M.D.     Scheduled Meds: . enoxaparin (LOVENOX) injection  40 mg Subcutaneous Q24H  .  hydroxyurea  1,000 mg Oral Daily  . ketorolac  30 mg Intravenous Q6H  . lactobacillus acidophilus  2 tablet Oral TID  . senna  1 tablet Oral BID   Continuous Infusions: . 0.9 % NaCl with KCl 20 mEq / L 100 mL/hr at 08/30/12 1001    Time spent 37 minutes.

## 2012-08-30 NOTE — Progress Notes (Signed)
Pt has been complaining of pain in joints. Dr. Ashley Royalty is going to try giving her Toradol to help with pt's pain. Pt stated to Dr. Ashley Royalty that her only reaction to Toradol is a rash. But, because it is listed as an allergy we will use a continuous pulse oximetry during and after medication administration. Will continue to monitor.

## 2012-08-31 LAB — COMPREHENSIVE METABOLIC PANEL
ALT: 37 U/L — ABNORMAL HIGH (ref 0–35)
AST: 34 U/L (ref 0–37)
Albumin: 3.5 g/dL (ref 3.5–5.2)
Alkaline Phosphatase: 64 U/L (ref 39–117)
BUN: 6 mg/dL (ref 6–23)
Chloride: 104 mEq/L (ref 96–112)
Potassium: 3.7 mEq/L (ref 3.5–5.1)
Sodium: 137 mEq/L (ref 135–145)
Total Bilirubin: 1.5 mg/dL — ABNORMAL HIGH (ref 0.3–1.2)

## 2012-08-31 LAB — CBC WITH DIFFERENTIAL/PLATELET
Basophils Relative: 1 % (ref 0–1)
Eosinophils Relative: 2 % (ref 0–5)
HCT: 24.3 % — ABNORMAL LOW (ref 36.0–46.0)
Hemoglobin: 8.5 g/dL — ABNORMAL LOW (ref 12.0–15.0)
Lymphocytes Relative: 40 % (ref 12–46)
MCH: 31.6 pg (ref 26.0–34.0)
MCHC: 35 g/dL (ref 30.0–36.0)
Monocytes Absolute: 0.7 10*3/uL (ref 0.1–1.0)
nRBC: 12 /100 WBC — ABNORMAL HIGH

## 2012-08-31 LAB — MAGNESIUM: Magnesium: 1.8 mg/dL (ref 1.5–2.5)

## 2012-08-31 MED ORDER — IBUPROFEN 600 MG PO TABS: 600.0000 mg | ORAL_TABLET | Freq: Four times a day (QID) | ORAL | Status: DC | PRN

## 2012-08-31 MED ORDER — OXYCODONE HCL 15 MG PO TABS: 15.0000 mg | ORAL_TABLET | Freq: Four times a day (QID) | ORAL | Status: DC | PRN

## 2012-08-31 MED ORDER — OXYCODONE HCL 5 MG PO TABS: 5.0000 mg | ORAL_TABLET | Freq: Four times a day (QID) | ORAL | Status: DC | PRN

## 2012-08-31 NOTE — Discharge Summary (Signed)
[name] MRN: [id number] DOB/AGE: Sep 07, 1990 22 y.o.  Admit date: 08/27/2012 Discharge date: 08/31/2012  Primary Care Physician:  Tristian Bouska A., MD   Discharge Diagnoses:   Patient Active Problem List  Diagnosis  . SICKLE CELL ANEMIA  . TRICHOTILLOMANIA  . Active smoker  . Back pain  . Depression  . GERD (gastroesophageal reflux disease)  . Stress  . Overweight  . Vaso-occlusive sickle cell crisis  . Right thigh pain  . Tachycardia  . Headache  . Nausea & vomiting  . Sickle cell pain crisis  . Leukocytosis  . Sickle cell hemolytic anemia  . Sickle cell anemia with crisis  . Chronic back pain  . Mood swings  . Migraines  . Avascular necrosis of humeral head    DISCHARGE MEDICATION:   Medication List    TAKE these medications       hydroxyurea 500 MG capsule  Commonly known as:  HYDREA  Take 1,000 mg by mouth daily. May take with food to minimize GI side effects.     ibuprofen 600 MG tablet  Commonly known as:  ADVIL,MOTRIN  Take 1 tablet (600 mg total) by mouth every 6 (six) hours as needed for pain.     lactobacillus acidophilus Tabs  Take 2 tablets by mouth 3 (three) times daily.     oxyCODONE 15 MG immediate release tablet  Commonly known as:  ROXICODONE  Take 1 tablet (15 mg total) by mouth every 6 (six) hours as needed. For pain.     oxyCODONE 5 MG immediate release tablet  Commonly known as:  Oxy IR/ROXICODONE  Take 1 tablet (5 mg total) by mouth every 6 (six) hours as needed for pain.     senna 8.6 MG Tabs  Commonly known as:  SENOKOT  Take 1 tablet (8.6 mg total) by mouth 2 (two) times daily.          Consults:     SIGNIFICANT DIAGNOSTIC STUDIES:  Dg Chest 2 View  08/27/2012  *RADIOLOGY REPORT*  Clinical Data: Sickle cell disease.  Back pain.  CHEST - 2 VIEW  Comparison: 06/22/2012  Findings: Heart size is normal.  There is vascular plethora without edema.  There is mild perihilar pulmonary scarring as seen previously.   Chronic sclerotic changes of the humeral heads and chronic spinal changes of sickle cell remain evident.  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  Pulmonary scarring. Otherwise no active process.  Chronic bony changes.   Original Report Authenticated By: Paulina Fusi, M.D.    Dg Shoulder Right  08/27/2012  *RADIOLOGY REPORT*  Clinical Data: Right shoulder pain.  Sickle cell  RIGHT SHOULDER - 2+ VIEW  Comparison: 06/22/2012  Findings: Sclerosis of the superior aspect of the humeral head is unchanged.  No fracture.  Normal alignment and no significant degenerative change.  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  Negative for fracture.   Original Report Authenticated By: Janeece Riggers, M.D.     BRIEF ADMITTING H & P: Happ is a 22 y.o. female, with history of sickle cell disease, migraines, chronic back pain, depression, GERD, trichotillomania, who was recently admitted few weeks ago for sickle cell crisis comes back with a one-day history of right shoulder pain, along with mild nausea and vomiting, she says her right shoulder pain is constant, worse with movement, with rest and pain medications, nonradiating, sharp, associated with some nausea vomiting, she claims she vomited several times yesterday and 2- 3 times today so  far, in the ER her workup was suggestive of leukocytosis, mildly elevated total bilirubin, mild tachycardia, she was diagnosed with sickle cell vaso-occlusive crisis and I was called to admit the patient. Urine pregnancy test is negative.  Of note patient denies any injuries to right shoulder, denies any warmth, swelling around the right shoulder. No fever chills. She denies any previous right shoulder discomfort however her chest x-ray shows chronic sclerotic changes on both humeral heads.   Hospital Course:  Present on Admission:  . Sickle cell pain crisis: Pt was admitted with acute VOC. She was intially treated with Bolus doses of dilaudid  and IVF. She had a component of inflammation, however had reported allergy to Toradol. After further discussion with patient is appears that the possible reaction was hives and patient was given a trial of Toradol which she tolerated well. She was then placed on a course of Toradol and transitioned to oral analgesics. Pt was functional and rated her pain as 6-7/10. She is discharged home in stable condition. Prescription issued for Roxicodone 15 mg #60, and Roxicodone 5 mg #60 to equal a total of 20 mg. (15 day supply)   . Leukocytosis: Felt to be associated with VOC. No evidence of infection.  . Avascular necrosis of humeral head: She is however functional. No indication for surgical intervention.  Disposition and Follow-up:  Pt was discharged home in stable condition. She is to follow up in the practice within 2 weeks or as needed.     Discharge Orders   Future Orders Complete By Expires     Activity as tolerated - No restrictions  As directed     Diet general  As directed        DISCHARGE EXAM:  General: Alert, awake, oriented x3, in no acute distress. Well appearing. Vital signs: BP 110/53, HR 64,T 97.8 F (36.6 C), temperature source Oral, RR 16, height 5\' 3"  (1.6 m), weight 84.097 kg (185 lb 6.4 oz), last menstrual period 08/12/2012, SpO2 100.00%. HEENT: [place]/AT PEERL, EOMI, anicteric  Heart: Regular rate and rhythm, without murmurs, rubs, gallops, PMI non-displaced, no heaves or thrills on palpation.  Lungs: Clear to auscultation, no wheezing or rhonchi noted.  Abdomen: Soft, nontender, nondistended, positive bowel sounds, no masses no hepatosplenomegaly noted..  Neuro: No focal neurological deficits noted cranial nerves II through XII grossly intact.  Musculoskeletal: No warm swelling or erythema around joints, no spinal tenderness noted.  Psychiatric: Patient alert and oriented x3, good insight and cognition, good recent to remote recall    Recent Labs  08/29/12 0353  08/31/12 0346  NA 137 137  K 3.6 3.7  CL 103 104  CO2 25 25  GLUCOSE 94 95  BUN 4* 6  CREATININE 0.52 0.64  CALCIUM 8.6 9.0  MG  --  1.8    Recent Labs  08/29/12 0353 08/31/12 0346  AST 39* 34  ALT 38* 37*  ALKPHOS 55 64  BILITOT 3.4* 1.5*  PROT 6.6 6.7  ALBUMIN 3.4* 3.5   No results found for this basename: LIPASE, AMYLASE,  in the last 72 hours  Recent Labs  08/30/12 0351 08/31/12 0346  WBC 15.8* 14.2*  NEUTROABS 10.4* 7.4  HGB 8.1* 8.5*  HCT 22.9* 24.3*  MCV 88.1 90.3  PLT 355 473*   Total time for discharge including decision making and face to face time is greater than 30 minutes  Signed: Earl Losee A. 08/31/2012, 12:23 PM

## 2012-09-04 ENCOUNTER — Encounter: Payer: Self-pay | Admitting: Internal Medicine

## 2012-09-22 IMAGING — CT CT FEMUR *R* W/O CM
3 of 4 series · 15 of 33 positions shown, 19 images · non-contrast
Comparison: 06/21/2011.

CLINICAL DATA: Sickle cell crisis.  Severe right thigh pain.

CT OF THE RIGHT FEMUR WITHOUT CONTRAST

[Series 5: lowextremity 1.5 b31s · axial · 0.54mm/px · z∈[-376,+103]mm · 9 of 379 slices shown, 12 images]
[im 30/379  soft-tissue]
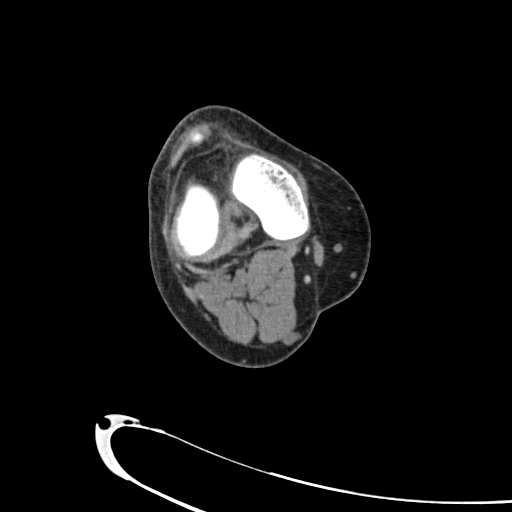
[im 30/379  bone]
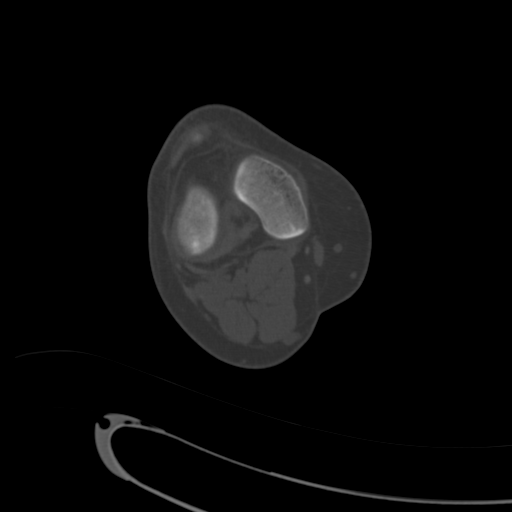
[im 88/379  bone]
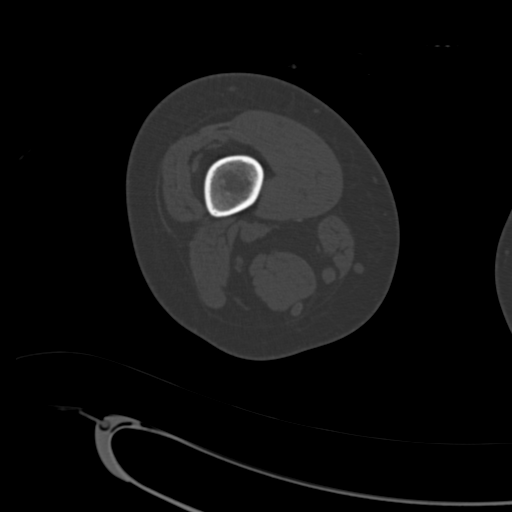
[im 117/379  bone]
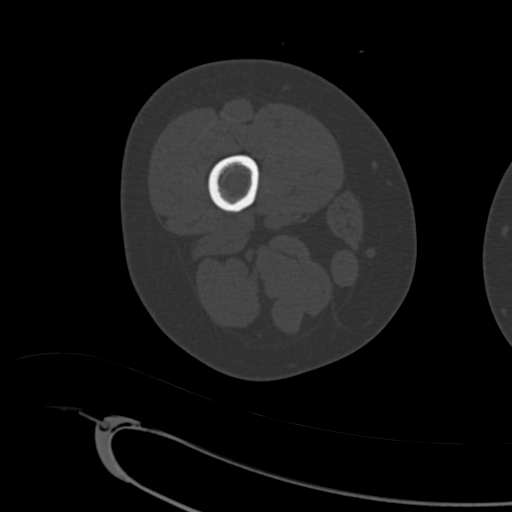
[im 146/379  bone]
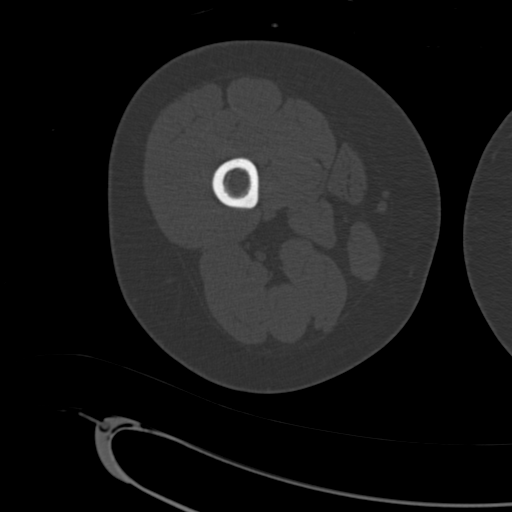
[im 204/379  soft-tissue]
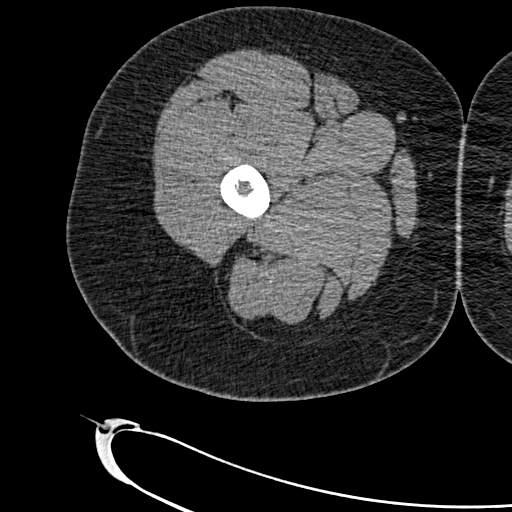
[im 204/379  bone]
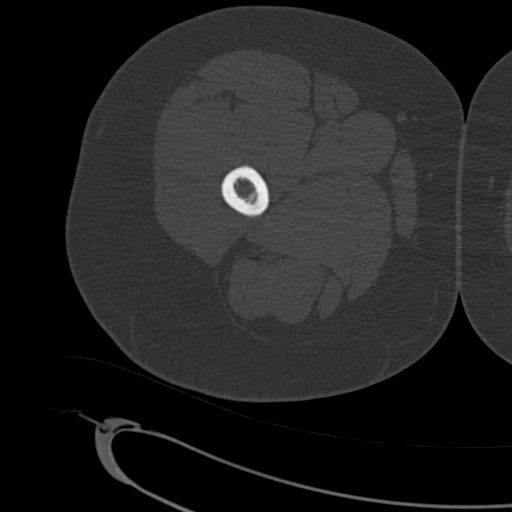
[im 233/379  bone]
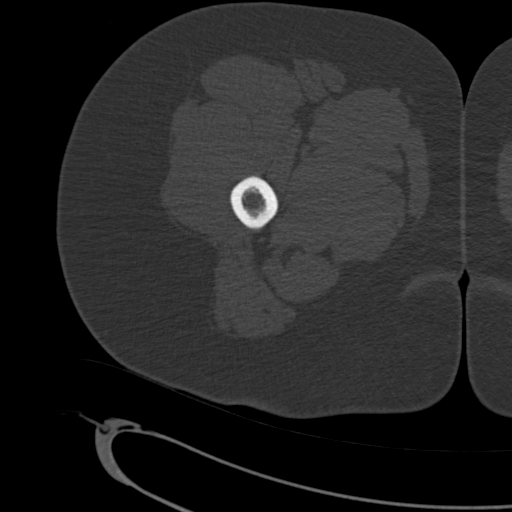
[im 262/379  bone]
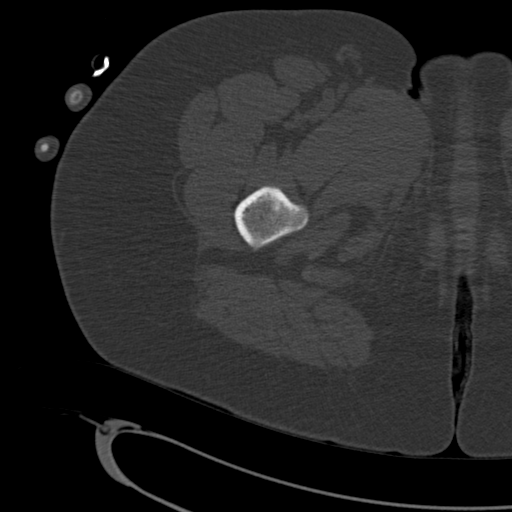
[im 320/379  bone]
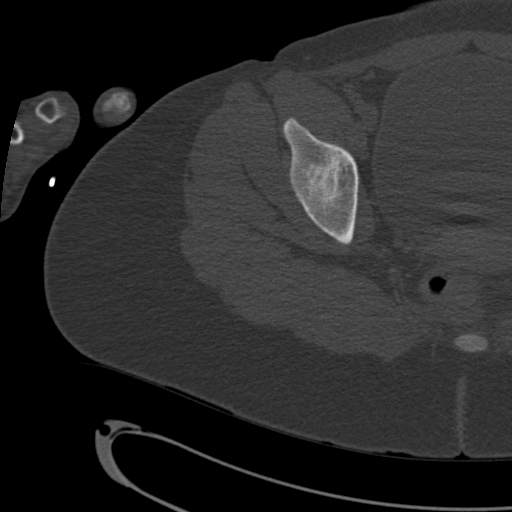
[im 349/379  soft-tissue]
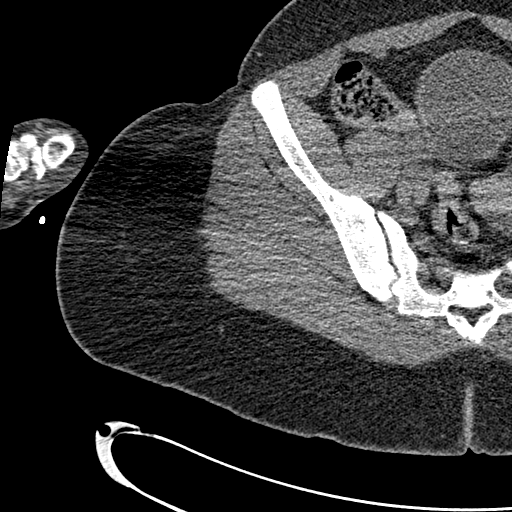
[im 349/379  bone]
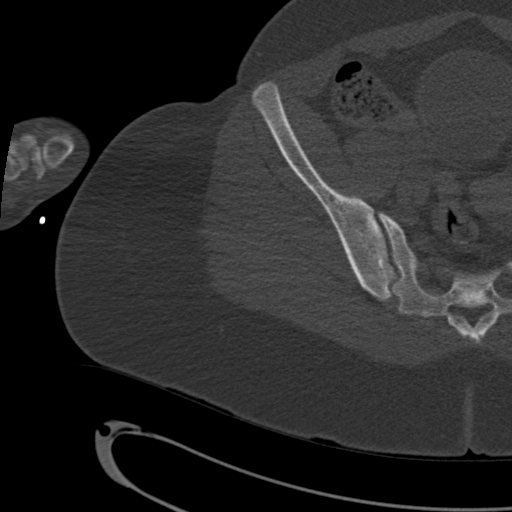

[Series 602: axial right thigh · coronal · 1.11mm/px · 1 of 79 slices shown]
[im 40/79  bone]
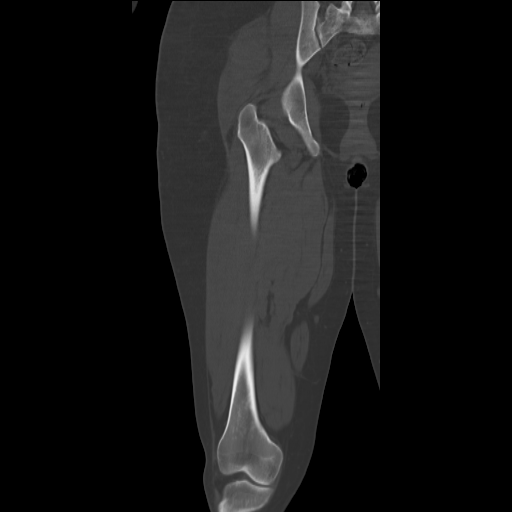

[Series 604: sagittal right thigh · sagittal · 1.11mm/px · 5 of 92 slices shown, 6 images]
[im 31/92  bone]
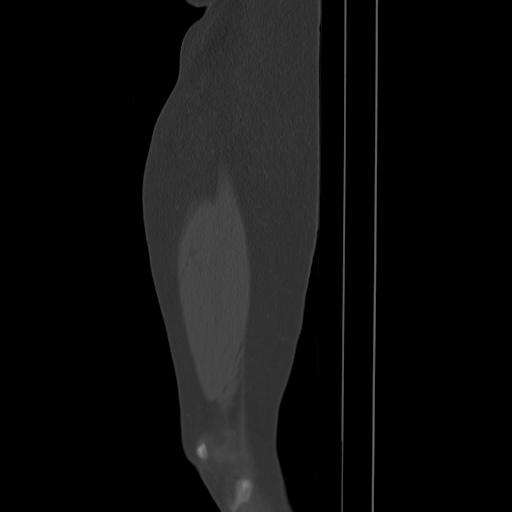
[im 38/92  bone]
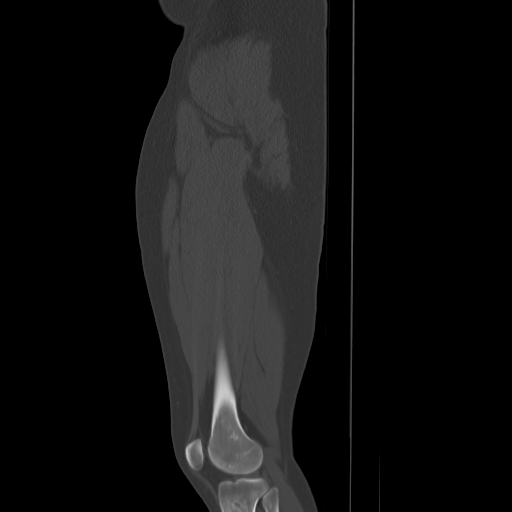
[im 46/92  soft-tissue]
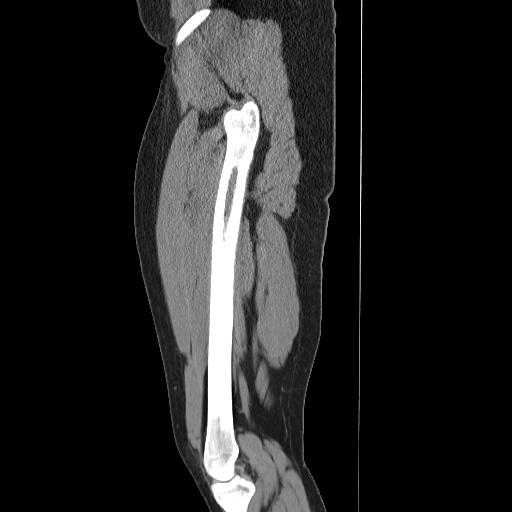
[im 46/92  bone]
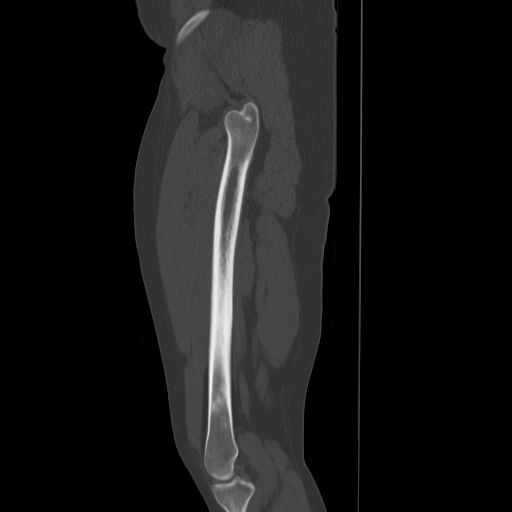
[im 54/92  bone]
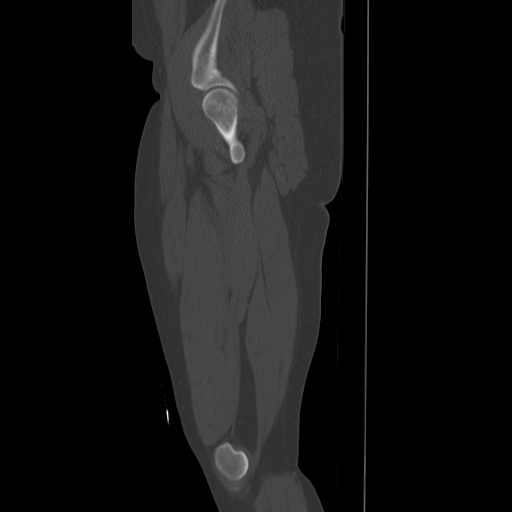
[im 61/92  bone]
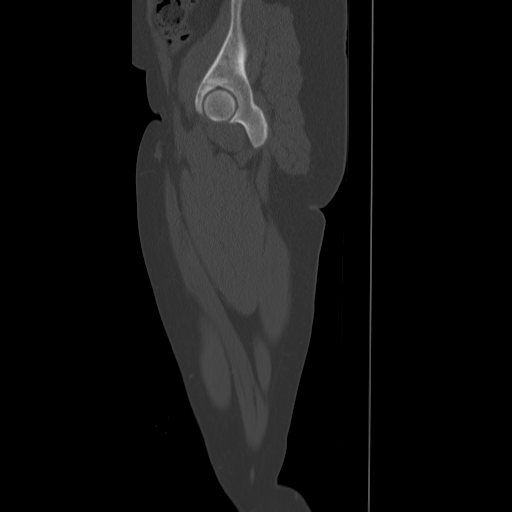

[15 of 33 positions shown; findings below may reference images not displayed]

FINDINGS: Vague sclerosis is present in the distal femoral
metaphysis.  In a patient with history of sickle cell anemia, this
could represent small area of infarction.  There is no AVN.  Mild
subchondral sclerosis of the right acetabular roof could be
associated with old bone infarct as well.  Soft tissues appear
normal and symmetric.  There is no soft tissue fluid collection or
inflammatory change.  Muscular compartments appear within normal
limits.  No distortion of fat planes.  The right thigh appears
symmetric when compared to the left thigh. There is no inguinal
adenopathy.  The visualized visceral pelvis appears within normal
limits.
IMPRESSION: 1.  Normal soft tissues of the right thigh for noncontrast CT.
2.  Small areas of sclerosis in the right acetabular roof and
distal femoral metaphysis may represent small bone infarcts in a
patient with history of sickle cell anemia.

## 2012-09-25 ENCOUNTER — Telehealth: Payer: Self-pay | Admitting: *Deleted

## 2012-09-25 NOTE — Telephone Encounter (Signed)
Original note did not have date pt called for request, unsure when rx was requested by pt.

## 2012-09-27 MED ORDER — OXYCODONE HCL 15 MG PO TABS: 15.0000 mg | ORAL_TABLET | Freq: Four times a day (QID) | ORAL | Status: DC | PRN

## 2012-09-27 MED ORDER — OXYCODONE HCL 5 MG PO TABS: 5.0000 mg | ORAL_TABLET | Freq: Four times a day (QID) | ORAL | Status: DC | PRN

## 2012-09-27 NOTE — Telephone Encounter (Signed)
Prescriptions refilled for Oxycodone 5 and 15 mg Q 6rs prn # 60 . Pt to pick up today

## 2012-10-05 ENCOUNTER — Emergency Department (HOSPITAL_COMMUNITY): Payer: Medicare Other

## 2012-10-05 ENCOUNTER — Inpatient Hospital Stay (HOSPITAL_COMMUNITY)
Admission: EM | Admit: 2012-10-05 | Discharge: 2012-10-12 | DRG: 812 | Disposition: A | Discharge status: Home / Self Care | Payer: Medicare Other | Attending: Internal Medicine | Admitting: Internal Medicine

## 2012-10-05 ENCOUNTER — Encounter (HOSPITAL_COMMUNITY): Payer: Self-pay | Admitting: Emergency Medicine

## 2012-10-05 DIAGNOSIS — D638 Anemia in other chronic diseases classified elsewhere: Secondary | ICD-10-CM | POA: Diagnosis present

## 2012-10-05 DIAGNOSIS — R0989 Other specified symptoms and signs involving the circulatory and respiratory systems: Secondary | ICD-10-CM | POA: Diagnosis not present

## 2012-10-05 DIAGNOSIS — D72829 Elevated white blood cell count, unspecified: Secondary | ICD-10-CM | POA: Diagnosis not present

## 2012-10-05 DIAGNOSIS — D5700 Hb-SS disease with crisis, unspecified: Secondary | ICD-10-CM | POA: Diagnosis not present

## 2012-10-05 DIAGNOSIS — R509 Fever, unspecified: Secondary | ICD-10-CM | POA: Diagnosis not present

## 2012-10-05 DIAGNOSIS — K5900 Constipation, unspecified: Secondary | ICD-10-CM

## 2012-10-05 DIAGNOSIS — D649 Anemia, unspecified: Secondary | ICD-10-CM | POA: Diagnosis not present

## 2012-10-05 DIAGNOSIS — Z452 Encounter for adjustment and management of vascular access device: Secondary | ICD-10-CM | POA: Diagnosis not present

## 2012-10-05 DIAGNOSIS — F17200 Nicotine dependence, unspecified, uncomplicated: Secondary | ICD-10-CM | POA: Diagnosis not present

## 2012-10-05 LAB — CBC WITH DIFFERENTIAL/PLATELET
Basophils Absolute: 0 10*3/uL (ref 0.0–0.1)
Basophils Relative: 0 % (ref 0–1)
Eosinophils Absolute: 0.3 10*3/uL (ref 0.0–0.7)
HCT: 26.4 % — ABNORMAL LOW (ref 36.0–46.0)
Hemoglobin: 9.2 g/dL — ABNORMAL LOW (ref 12.0–15.0)
Lymphs Abs: 1.8 10*3/uL (ref 0.7–4.0)
MCH: 31.2 pg (ref 26.0–34.0)
MCHC: 34.8 g/dL (ref 30.0–36.0)
MCV: 89.5 fL (ref 78.0–100.0)
Monocytes Absolute: 1.8 10*3/uL — ABNORMAL HIGH (ref 0.1–1.0)
Neutro Abs: 22.3 10*3/uL — ABNORMAL HIGH (ref 1.7–7.7)
RDW: 17.5 % — ABNORMAL HIGH (ref 11.5–15.5)

## 2012-10-05 LAB — BASIC METABOLIC PANEL
BUN: 6 mg/dL (ref 6–23)
Chloride: 104 mEq/L (ref 96–112)
GFR calc non Af Amer: >90 mL/min (ref >90)
Glucose, Bld: 85 mg/dL (ref 70–99)
Potassium: 3.6 mEq/L (ref 3.5–5.1)
Sodium: 139 mEq/L (ref 135–145)

## 2012-10-05 IMAGING — CR DG CHEST 2V
2 series · 2 of 2 positions shown · non-contrast
Comparison: 10/06/2011 and earlier.

CLINICAL DATA: 20-year-old female with sickle cell disease.  Right
lower extremity pain.

CHEST - 2 VIEW

[w chest pa]
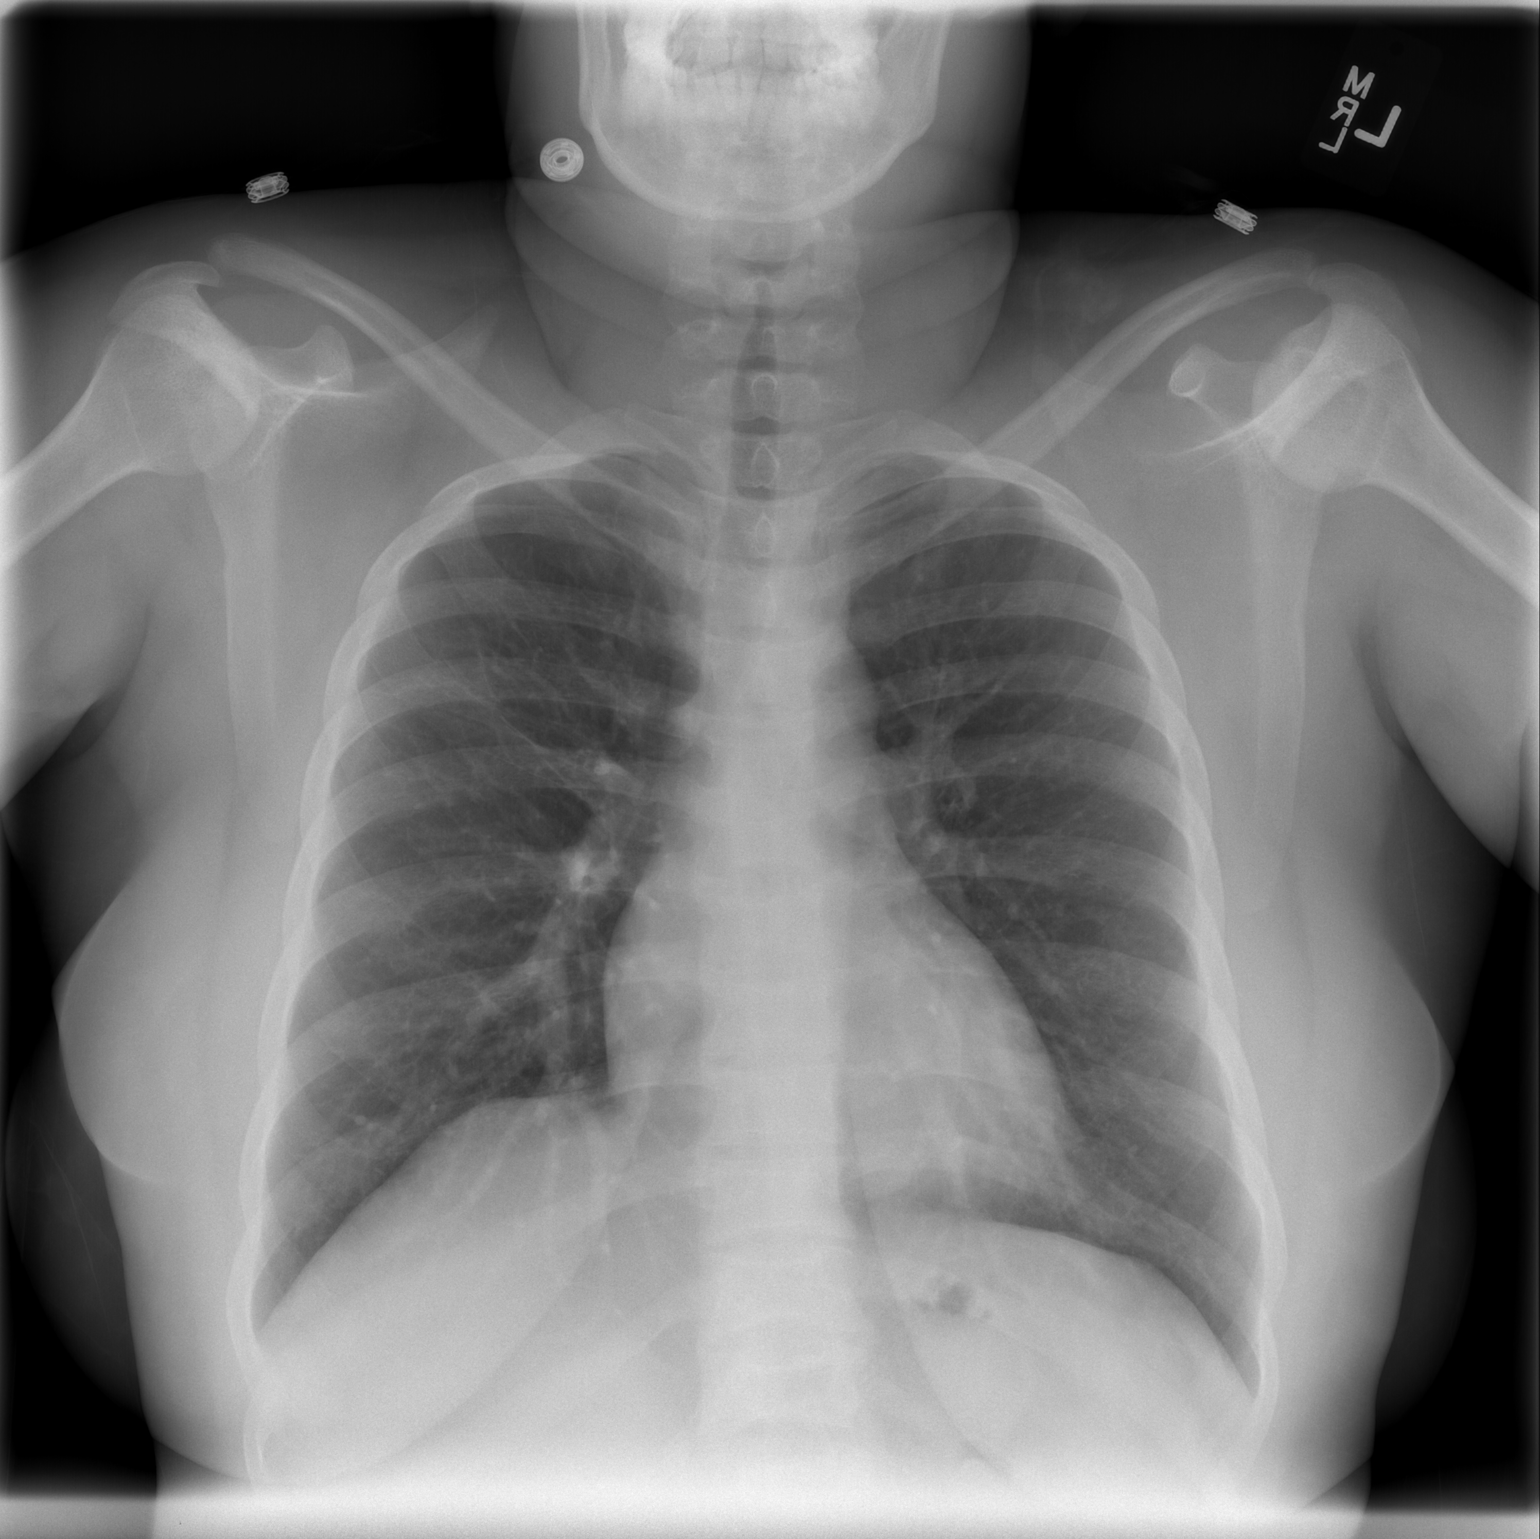

[w chest lat]
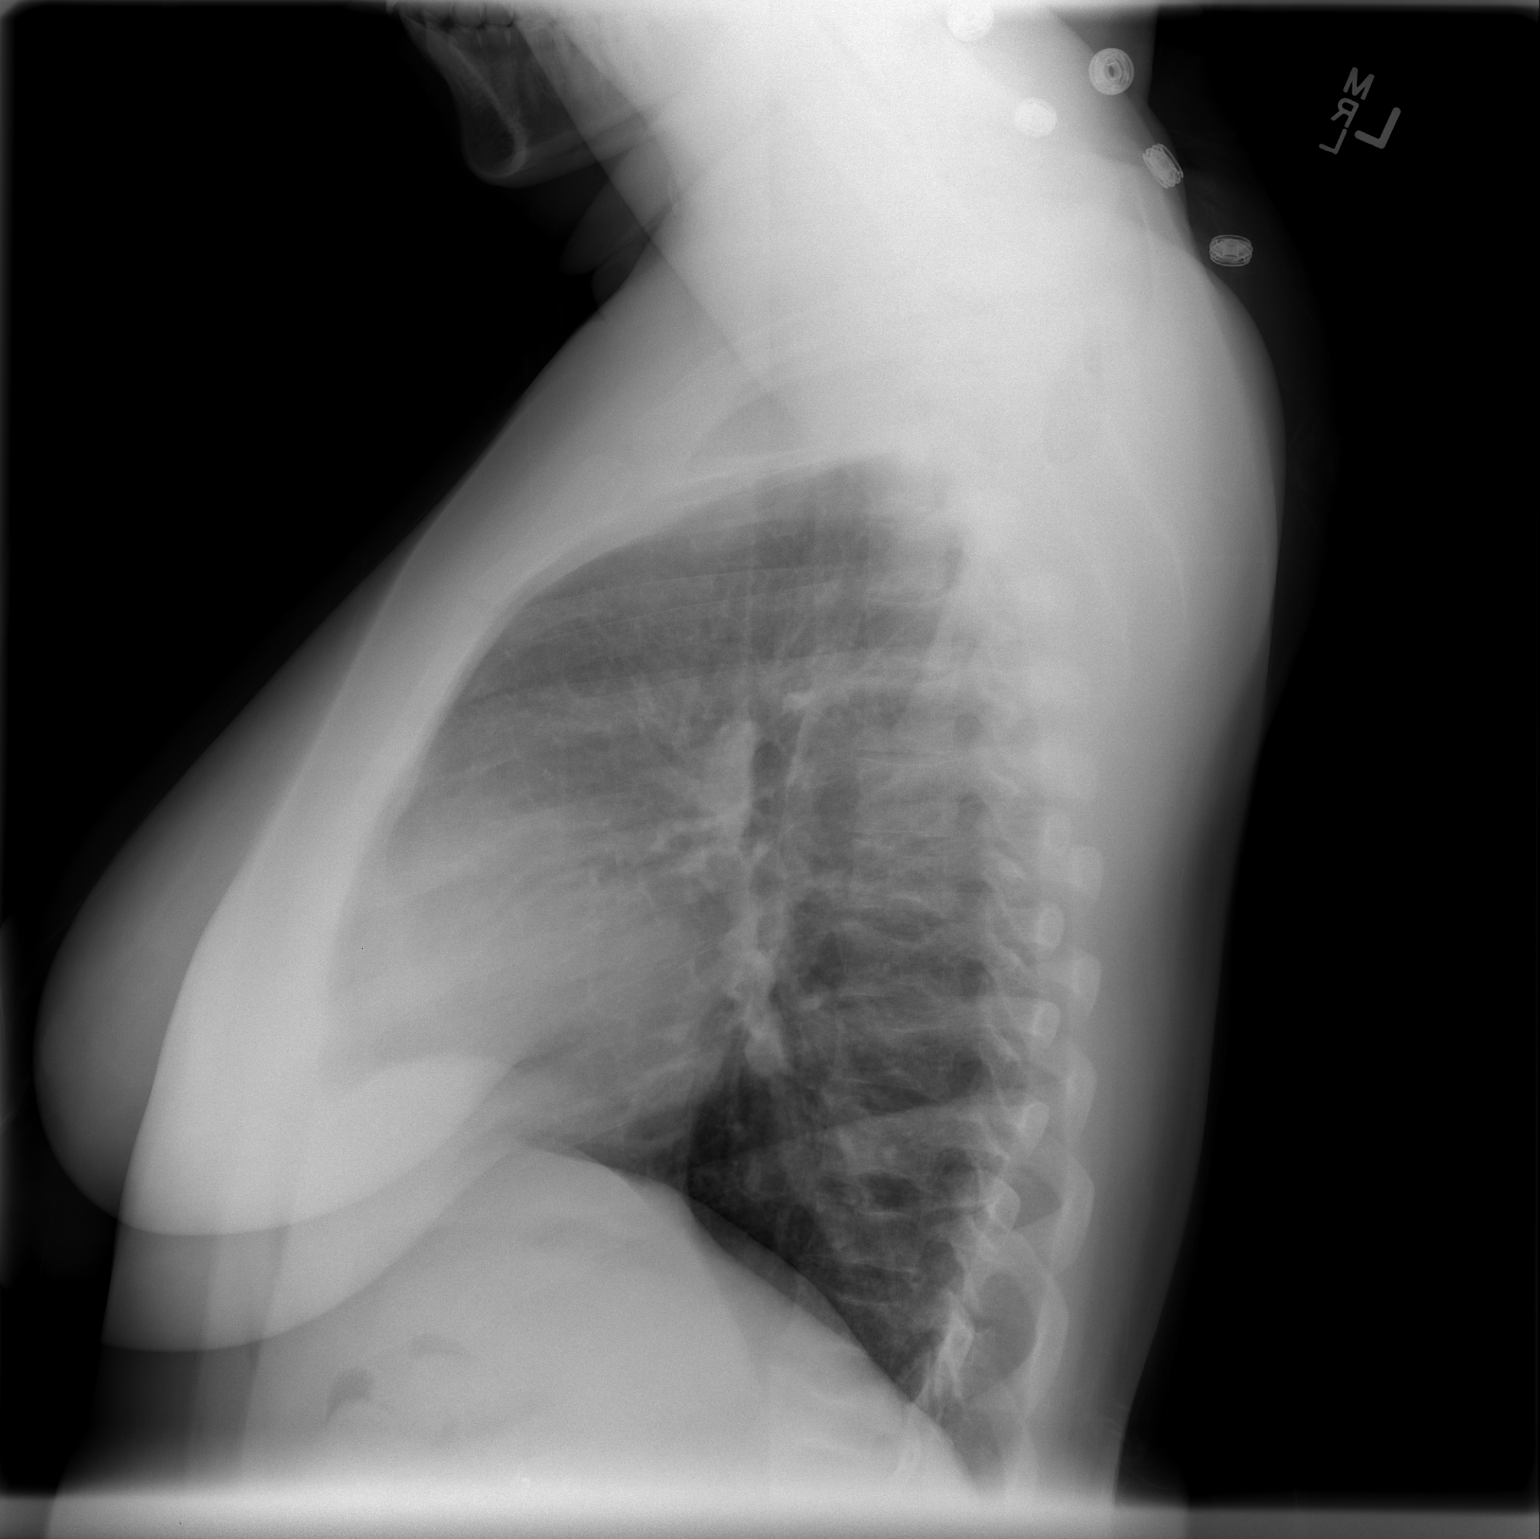

[2 of 2 positions shown; findings below may reference images not displayed]

FINDINGS: Stable lung volumes.  Cardiac size and mediastinal
contours are within normal limits.  Visualized tracheal air column
is within normal limits.  No pneumothorax, pulmonary edema, pleural
effusion or acute pulmonary opacity.  Interval decreased
vascularity.  Stable mild right suprahilar scarring.  Bony changes
of sickle cell disease throughout the visible spine.
IMPRESSION: No acute cardiopulmonary abnormality.  Stable sequelae of sickle
cell disease.

## 2012-10-05 MED ORDER — HYDROMORPHONE HCL PF 2 MG/ML IJ SOLN
2.0000 mg | INTRAMUSCULAR | Status: DC | PRN
  Administered 2012-10-05: 3 mg via INTRAVENOUS
  Administered 2012-10-05: 1 mg via INTRAVENOUS
  Administered 2012-10-06 (×4): 4 mg via INTRAVENOUS
  Filled 2012-10-05 (×5): qty 2

## 2012-10-05 MED ORDER — ONDANSETRON HCL 4 MG/2ML IJ SOLN
4.0000 mg | Freq: Once | INTRAMUSCULAR | Status: AC
  Administered 2012-10-05: 4 mg via INTRAVENOUS
  Filled 2012-10-05: qty 2

## 2012-10-05 MED ORDER — ENOXAPARIN SODIUM 40 MG/0.4ML ~~LOC~~ SOLN
40.0000 mg | SUBCUTANEOUS | Status: DC
  Administered 2012-10-05 – 2012-10-10 (×6): 40 mg via SUBCUTANEOUS
  Filled 2012-10-05 (×8): qty 0.4

## 2012-10-05 MED ORDER — DEXTROSE-NACL 5-0.45 % IV SOLN
INTRAVENOUS | Status: DC
  Administered 2012-10-05 – 2012-10-07 (×4): via INTRAVENOUS
  Administered 2012-10-08: 100 mL/h via INTRAVENOUS
  Administered 2012-10-09 – 2012-10-10 (×2): 1000 mL via INTRAVENOUS
  Administered 2012-10-10: 11:00:00 via INTRAVENOUS
  Administered 2012-10-11 – 2012-10-12 (×2): 1000 mL via INTRAVENOUS

## 2012-10-05 MED ORDER — OXYCODONE HCL 5 MG PO TABS
5.0000 mg | ORAL_TABLET | ORAL | Status: DC | PRN
  Administered 2012-10-08: 5 mg via ORAL
  Filled 2012-10-05: qty 1

## 2012-10-05 MED ORDER — ACETAMINOPHEN 650 MG RE SUPP: 650.0000 mg | Freq: Four times a day (QID) | RECTAL | Status: DC | PRN

## 2012-10-05 MED ORDER — ALUM & MAG HYDROXIDE-SIMETH 200-200-20 MG/5ML PO SUSP: 30.0000 mL | Freq: Four times a day (QID) | ORAL | Status: DC | PRN

## 2012-10-05 MED ORDER — ONDANSETRON HCL 4 MG/2ML IJ SOLN
4.0000 mg | Freq: Four times a day (QID) | INTRAMUSCULAR | Status: DC | PRN
  Administered 2012-10-06 – 2012-10-10 (×4): 4 mg via INTRAVENOUS
  Filled 2012-10-05 (×4): qty 2

## 2012-10-05 MED ORDER — HYDROMORPHONE HCL PF 2 MG/ML IJ SOLN
2.0000 mg | Freq: Once | INTRAMUSCULAR | Status: AC
  Administered 2012-10-05: 2 mg via INTRAVENOUS
  Filled 2012-10-05: qty 1

## 2012-10-05 MED ORDER — ZOLPIDEM TARTRATE 5 MG PO TABS
5.0000 mg | ORAL_TABLET | Freq: Every evening | ORAL | Status: DC | PRN
  Administered 2012-10-08: 5 mg via ORAL
  Filled 2012-10-05: qty 1

## 2012-10-05 MED ORDER — SODIUM CHLORIDE 0.9 % IV BOLUS (SEPSIS)
1000.0000 mL | Freq: Once | INTRAVENOUS | Status: AC
  Administered 2012-10-05: 1000 mL via INTRAVENOUS

## 2012-10-05 MED ORDER — ONDANSETRON HCL 4 MG PO TABS: 4.0000 mg | ORAL_TABLET | Freq: Four times a day (QID) | ORAL | Status: DC | PRN

## 2012-10-05 MED ORDER — KETOROLAC TROMETHAMINE 30 MG/ML IJ SOLN
30.0000 mg | Freq: Once | INTRAMUSCULAR | Status: DC
  Filled 2012-10-05: qty 1

## 2012-10-05 MED ORDER — HYDROMORPHONE HCL PF 1 MG/ML IJ SOLN
1.0000 mg | Freq: Once | INTRAMUSCULAR | Status: AC
  Administered 2012-10-05: 1 mg via INTRAVENOUS
  Filled 2012-10-05: qty 1

## 2012-10-05 MED ORDER — ACETAMINOPHEN 325 MG PO TABS
650.0000 mg | ORAL_TABLET | Freq: Four times a day (QID) | ORAL | Status: DC | PRN
  Filled 2012-10-05: qty 2

## 2012-10-05 MED ORDER — HYDROMORPHONE HCL PF 1 MG/ML IJ SOLN: 0.5000 mg | INTRAMUSCULAR | Status: DC | PRN

## 2012-10-05 NOTE — H&P (Signed)
[facility] History and Physical  [name]:[id number] DOB: 1990-09-21 DOA: 10/05/2012  Referring physician:  EDP  PCP: MATTHEWS,MICHELLE A., MD  Specialists:   Chief Complaint: Pain All Over  HPI: [name] is a 22 y.o. female with Sickle Cell Anemia who presents to the ED with complaints of severe 10/10 pain all over her body, in her face, head, arms, back, and legs since 10 pm last night.   She did not have any relief with her home pain medications.  She denies having any fevers or chills or nausea or vomiting or chest pain or SOB.  She was evaluated in her in the ED and administered IV  Dilaudid a total of 5 mg without relief and was referred for medical admission.      Review of Systems: The patient denies anorexia, fever, chills , weight loss, vision loss, decreased hearing, hoarseness, chest pain, syncope, dyspnea on exertion, peripheral edema, balance deficits, hemoptysis, abdominal pain, nausea, vomiting, diarrhea, hematemesis, melena, hematochezia, severe indigestion/heartburn, hematuria, incontinence, muscle weakness, suspicious skin lesions, transient blindness, difficulty walking, depression, unusual weight change, abnormal bleeding, enlarged lymph nodes, angioedema, and breast masses.    Past Medical History  Diagnosis Date  . H/O: 1 miscarriage 03/22/2011  . 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 Depression 01/06/2011  . GERD (gastroesophageal reflux disease) 02/17/2011  . Trichotillomania     h/o  . Blood transfusion     "lots"  . Sickle cell anemia with crisis   . Exertional dyspnea     "sometimes"  . Sickle cell anemia   . Headache   . Migraines 11/08/11    "@ least twice/month"  . Chronic back pain     "very severe; have knot in my back; from tight muscle; take RX and exercise for it"  . Mood swings 11/08/11    "I go back and forth; real bad"  . Pregnancy   . Blood dyscrasia     SICKLE CELL    Past Surgical History  Procedure Laterality Date   . Cholecystectomy  05/2010  . Dilation and curettage of uterus  02/20/11    S/P miscarriage     Medications:  HOME MEDS: Prior to Admission medications   Medication Sig Start Date End Date Taking? Authorizing Provider  hydroxyurea (HYDREA) 500 MG capsule Take 1,000 mg by mouth daily. May take with food to minimize GI side effects.   Yes Historical Provider, MD  ibuprofen (ADVIL,MOTRIN) 600 MG tablet Take 1 tablet (600 mg total) by mouth every 6 (six) hours as needed for pain. 08/31/12  Yes Altha Harm, MD  lactobacillus acidophilus (BACID) TABS Take 2 tablets by mouth 3 (three) times daily. 08/30/12  Yes Altha Harm, MD  oxyCODONE (OXY IR/ROXICODONE) 5 MG immediate release tablet Take 1 tablet (5 mg total) by mouth every 6 (six) hours as needed for pain. 09/27/12  Yes Grayce Sessions, NP  oxyCODONE (ROXICODONE) 15 MG immediate release tablet Take 1 tablet (15 mg total) by mouth every 6 (six) hours as needed. For pain. 09/27/12  Yes Grayce Sessions, NP  senna (SENOKOT) 8.6 MG TABS Take 1 tablet (8.6 mg total) by mouth 2 (two) times daily. 06/27/12  Yes Sorin Luanne Bras, MD    Allergies:  Allergies  Allergen Reactions  . Carrot Oil Hives and Swelling  . Latex Rash    Social History:   reports that she quit smoking about 15 months ago. She has never used smokeless tobacco. She reports that  she uses illicit drugs (Marijuana and Other-see comments). She reports that she does not drink alcohol.  Family History: Family History  Problem Relation Age of Onset  . Diabetes Mother   . Alcoholism    . Depression    . Hypercholesterolemia    . Hypertension    . Migraines    . Diabetes Maternal Grandmother   . Diabetes Paternal Grandmother      Physical Exam:  GEN:  Tearful 22 year old Obese African American Female examined  and in discomfort but no acute distress; cooperative with exam Filed Vitals:   10/05/12 1945 10/05/12 2030 10/05/12 2115 10/05/12 2130  BP: 110/62  106/58 106/52 116/65  Pulse: 106 106 111 109  Temp:      TempSrc:      Resp: 19 20 21 21   SpO2: 100% 100% 100% 100%   Blood pressure 116/65, pulse 109, temperature 99.8 F (37.7 C), temperature source Oral, resp. rate 21, last menstrual period 10/01/2012, SpO2 100.00%. PSYCH: She is alert and oriented x4; does not appear anxious does not appear depressed; affect is normal HEENT: Normocephalic and Atraumatic, Mucous membranes pink; PERRLA; EOM intact; Fundi:  Benign;  No scleral icterus, Nares: Patent, Oropharynx: Clear, Fair Dentition, Neck:  FROM, no cervical lymphadenopathy nor thyromegaly or carotid bruit; no JVD; Breasts:: Not examined CHEST WALL: No tenderness CHEST: Normal respiration, clear to auscultation bilaterally HEART: Regular rate and rhythm; no murmurs rubs or gallops BACK: No kyphosis or scoliosis; no CVA tenderness ABDOMEN: Positive Bowel Sounds, Obese, soft non-tender; no masses, no organomegaly.    Rectal Exam: Not done EXTREMITIES: No cyanosis, clubbing or edema; no ulcerations. Genitalia: not examined PULSES: 2+ and symmetric SKIN: Normal hydration no rash or ulceration CNS: Cranial nerves 2-12 grossly intact no focal neurologic deficit   Labs & Imaging Results for orders placed during the hospital encounter of 10/05/12 (from the past 48 hour(s))  CBC WITH DIFFERENTIAL     Status: Abnormal   Collection Time    10/05/12  6:25 PM      Result Value Range   WBC 26.2 (*) 4.0 - 10.5 K/uL   RBC 2.95 (*) 3.87 - 5.11 MIL/uL   Hemoglobin 9.2 (*) 12.0 - 15.0 g/dL   HCT 45.4 (*) 09.8 - 11.9 %   MCV 89.5  78.0 - 100.0 fL   MCH 31.2  26.0 - 34.0 pg   MCHC 34.8  30.0 - 36.0 g/dL   RDW 14.7 (*) 82.9 - 56.2 %   Platelets 492 (*) 150 - 400 K/uL   Neutrophils Relative % 85 (*) 43 - 77 %   Lymphocytes Relative 7 (*) 12 - 46 %   Monocytes Relative 7  3 - 12 %   Eosinophils Relative 1  0 - 5 %   Basophils Relative 0  0 - 1 %   Neutro Abs 22.3 (*) 1.7 - 7.7 K/uL   Lymphs  Abs 1.8  0.7 - 4.0 K/uL   Monocytes Absolute 1.8 (*) 0.1 - 1.0 K/uL   Eosinophils Absolute 0.3  0.0 - 0.7 K/uL   Basophils Absolute 0.0  0.0 - 0.1 K/uL   RBC Morphology POLYCHROMASIA PRESENT     Smear Review LARGE PLATELETS PRESENT    RETICULOCYTES     Status: Abnormal   Collection Time    10/05/12  6:25 PM      Result Value Range   Retic Ct Pct 20.9 (*) 0.4 - 3.1 %   RBC. 2.95 (*) 3.87 -  5.11 MIL/uL   Retic Count, Manual 616.6 (*) 19.0 - 186.0 K/uL  BASIC METABOLIC PANEL     Status: None   Collection Time    10/05/12  6:25 PM      Result Value Range   Sodium 139  135 - 145 mEq/L   Potassium 3.6  3.5 - 5.1 mEq/L   Chloride 104  96 - 112 mEq/L   CO2 25  19 - 32 mEq/L   Glucose, Bld 85  70 - 99 mg/dL   BUN 6  6 - 23 mg/dL   Creatinine, Ser 4.09  0.50 - 1.10 mg/dL   Calcium 9.1  8.4 - 81.1 mg/dL   GFR calc non Af Amer >90  >90 mL/min   GFR calc Af Amer >90  >90 mL/min   Comment:            The eGFR has been calculated     using the CKD EPI equation.     This calculation has not been     validated in all clinical     situations.     eGFR's persistently     <90 mL/min signify     possible Chronic Kidney Disease.     Radiological Exams on Admission: Dg Chest 2 View  10/05/2012   *RADIOLOGY REPORT*  Clinical Data: Chest pain, shortness of breath, sickle cell crisis  CHEST - 2 VIEW  Comparison: 08/27/2012  Findings: Lung volumes are low with crowding of the bronchovascular markings.  Heart size mildly enlarged with central vascular congestion.  No focal pulmonary opacity.  No pleural effusion.  No acute osseous finding.  No significant change in fish mouth deformity of vertebral bodies typical for sickle cell disease. Cholecystectomy clips noted.  IMPRESSION: Low lung volumes with mild cardiomegaly and central vascular congestion.   Original Report Authenticated By: Christiana Pellant, M.D.    Assessment/Plan:    Sickle Cell Anemia Active Problems:   Anemia   Leukocytosis,  unspecified   1.  Sickle Cell Anemia- pain Control with IV Dilaudid PRN, consider PCA dilaudid if needed.   IVFs for rehydration.    2.  Anemia- due to #1, not low enough for transfusion at this time.  Monitor.     3.  Leukocytosis-  Most likely Stress Rxn, monitor trend.      Code Status:  FULL CODE Family Communication:  No Family Present Disposition Plan:  Return to Home  Time spent: 96 Minutes  Ron Parker [facility] Pager [phone]  If 7PM-7AM, please contact night-coverage [email or web address] Password [email or web address] 10/05/2012, 10:00 PM

## 2012-10-05 NOTE — ED Notes (Signed)
Informed by Charge RN on [facility] and the secretary that they are deciding appropriate placement either on [address] [facility] or [address].  Ursula Alert, Consulting civil engineer in ER made aware.

## 2012-10-05 NOTE — ED Notes (Signed)
Pt is now going to a telemetry floor.

## 2012-10-05 NOTE — ED Provider Notes (Signed)
[name] S 8:00 PM patient discussed in sign out. Patient presenting with sickle cell pain crisis. Pain meds ordered. Labs returned without significant change from baseline although WBC is higher than usual. Patient afebrile with no complaints of focal infection symptoms. Additional pain meds ordered. Chest x-ray pending.  Patient reporting she continues to have significant pains. She remains slightly tachycardic. Normal O2 sats and respirations. Chest x-ray without concerning findings. No pneumonia. Patient feels she may require admission for additional pain control.  Spoke with [facility]. They will see patient and admit to med surge bed under team 8.  Angus Seller, PA-C 10/05/12 2133

## 2012-10-05 NOTE — ED Notes (Signed)
Pt given a cup of water 

## 2012-10-05 NOTE — ED Provider Notes (Signed)
History     CSN: [id number]  Arrival date & time 10/05/12  1745   First MD Initiated Contact with Patient 10/05/12 1756      Chief Complaint  Patient presents with  . Sickle Cell Pain Crisis    (Consider location/radiation/quality/duration/timing/severity/associated sxs/prior treatment) Patient is a 22 y.o. female presenting with sickle cell pain. The history is provided by the patient. No language interpreter was used.  Sickle Cell Pain Crisis Location:  Unable to specify (Generalized muscular pain.) Severity:  Severe Onset quality:  Gradual Associated symptoms comment:  Pain since this morning similar to sickle cell crisis pain. No fever, cough. No pain specific to the chest. Nausea without vomiting.   Past Medical History  Diagnosis Date  . H/O: 1 miscarriage 03/22/2011  . 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 Depression 01/06/2011  . GERD (gastroesophageal reflux disease) 02/17/2011  . Trichotillomania     h/o  . Blood transfusion     "lots"  . Sickle cell anemia with crisis   . Exertional dyspnea     "sometimes"  . Sickle cell anemia   . Headache   . Migraines 11/08/11    "@ least twice/month"  . Chronic back pain     "very severe; have knot in my back; from tight muscle; take RX and exercise for it"  . Mood swings 11/08/11    "I go back and forth; real bad"  . Pregnancy   . Blood dyscrasia     SICKLE CELL    Past Surgical History  Procedure Laterality Date  . Cholecystectomy  05/2010  . Dilation and curettage of uterus  02/20/11    S/P miscarriage    Family History  Problem Relation Age of Onset  . Diabetes Mother   . Alcoholism    . Depression    . Hypercholesterolemia    . Hypertension    . Migraines      History  Substance Use Topics  . Smoking status: Former Smoker    Quit date: 06/17/2011  . Smokeless tobacco: Never Used  . Alcohol Use: No     Comment: pt states she quit in May    OB History   Grav Para Term Preterm Abortions TAB SAB Ect Mult  Living   2    1  1         Obstetric Comments   Miscarried in October 2012 at about 7 weeks      Review of Systems  Constitutional: Negative for chills.  HENT: Negative.   Respiratory: Negative.   Cardiovascular: Negative.   Gastrointestinal: Negative.   Genitourinary: Negative.   Musculoskeletal: Positive for myalgias.  Skin: Negative.  Negative for rash.  Neurological: Negative.   Psychiatric/Behavioral: Negative for confusion.    Allergies  Carrot oil and Latex  Home Medications   Current Outpatient Rx  Name  Route  Sig  Dispense  Refill  . hydroxyurea (HYDREA) 500 MG capsule   Oral   Take 1,000 mg by mouth daily. May take with food to minimize GI side effects.         Marland Kitchen ibuprofen (ADVIL,MOTRIN) 600 MG tablet   Oral   Take 1 tablet (600 mg total) by mouth every 6 (six) hours as needed for pain.   60 tablet   0   . lactobacillus acidophilus (BACID) TABS   Oral   Take 2 tablets by mouth 3 (three) times daily.   10 tablet   0   . oxyCODONE (OXY IR/ROXICODONE) 5 MG immediate  release tablet   Oral   Take 1 tablet (5 mg total) by mouth every 6 (six) hours as needed for pain.   60 tablet   0     Take with 15 mg tabs to equal 20 mg q 6 hours as n ...   . oxyCODONE (ROXICODONE) 15 MG immediate release tablet   Oral   Take 1 tablet (15 mg total) by mouth every 6 (six) hours as needed. For pain.   60 tablet   0     Take with 15 mg tabs to equal 20 mg every 6 hours  ...   . senna (SENOKOT) 8.6 MG TABS   Oral   Take 1 tablet (8.6 mg total) by mouth 2 (two) times daily.   120 each        BP 140/56  Pulse 114  Temp(Src) 99.8 F (37.7 C) (Oral)  Resp 16  SpO2 99%  Physical Exam  Constitutional: She appears well-developed and well-nourished.  HENT:  Head: Normocephalic.  Neck: Normal range of motion. Neck supple.  Cardiovascular: Normal rate and regular rhythm.   Pulmonary/Chest: Effort normal and breath sounds normal.  Abdominal: Soft. Bowel  sounds are normal. There is no tenderness. There is no rebound and no guarding.  Musculoskeletal: Normal range of motion.  No swelling, redness. Generalized muscular tenderness.   Neurological: She is alert. No cranial nerve deficit.  Skin: Skin is warm and dry. No rash noted.  Psychiatric: She has a normal mood and affect.    ED Course  Procedures (including critical care time)  Labs Reviewed  CBC WITH DIFFERENTIAL - Abnormal; Notable for the following:    WBC 26.2 (*)    RBC 2.95 (*)    Hemoglobin 9.2 (*)    HCT 26.4 (*)    RDW 17.5 (*)    Platelets 492 (*)    Neutrophils Relative % 85 (*)    Lymphocytes Relative 7 (*)    Neutro Abs 22.3 (*)    Monocytes Absolute 1.8 (*)    All other components within normal limits  RETICULOCYTES - Abnormal; Notable for the following:    Retic Ct Pct 20.9 (*)    RBC. 2.95 (*)    Retic Count, Manual 616.6 (*)    All other components within normal limits  BASIC METABOLIC PANEL   No results found.   No diagnosis found.  1. Sickle Cell Crisis  MDM  Pain is improving. She is getting fluids to improve blood pressure and tachycardia. Will be monitored to determine disposition by Ivonne Andrew, PA-C.        Arnoldo Hooker, PA-C 10/05/12 1952

## 2012-10-05 NOTE — ED Notes (Signed)
Dr. Jenkins at bedside. 

## 2012-10-05 NOTE — ED Notes (Signed)
Pt c/o of sickle cell pain crisis that started late last night. C/o of pain "all over" 10/10. Nausea, vomiting, denies diarrhea.

## 2012-10-05 NOTE — ED Provider Notes (Signed)
Medical screening examination/treatment/procedure(s) were performed by non-physician practitioner and as supervising physician I was immediately available for consultation/collaboration.    Celene Kras, MD 10/05/12 [phone]

## 2012-10-06 DIAGNOSIS — D72829 Elevated white blood cell count, unspecified: Secondary | ICD-10-CM

## 2012-10-06 DIAGNOSIS — D649 Anemia, unspecified: Secondary | ICD-10-CM

## 2012-10-06 DIAGNOSIS — R509 Fever, unspecified: Secondary | ICD-10-CM | POA: Diagnosis present

## 2012-10-06 LAB — BASIC METABOLIC PANEL
BUN: 4 mg/dL — ABNORMAL LOW (ref 6–23)
Chloride: 102 mEq/L (ref 96–112)
GFR calc Af Amer: >90 mL/min (ref >90)
Glucose, Bld: 114 mg/dL — ABNORMAL HIGH (ref 70–99)
Potassium: 3.6 mEq/L (ref 3.5–5.1)

## 2012-10-06 LAB — URINALYSIS, ROUTINE W REFLEX MICROSCOPIC
Bilirubin Urine: NEGATIVE
Glucose, UA: NEGATIVE mg/dL
Hgb urine dipstick: NEGATIVE
Specific Gravity, Urine: 1.012 (ref 1.005–1.030)
Urobilinogen, UA: 4 mg/dL — ABNORMAL HIGH (ref 0.0–1.0)
pH: 6 (ref 5.0–8.0)

## 2012-10-06 LAB — CBC
HCT: 22.1 % — ABNORMAL LOW (ref 36.0–46.0)
Hemoglobin: 7.7 g/dL — ABNORMAL LOW (ref 12.0–15.0)
MCHC: 33.9 g/dL (ref 30.0–36.0)

## 2012-10-06 MED ORDER — HYDROMORPHONE HCL PF 2 MG/ML IJ SOLN
2.0000 mg | INTRAMUSCULAR | Status: DC | PRN
  Administered 2012-10-07: 4 mg via INTRAVENOUS
  Administered 2012-10-07: 2 mg via INTRAVENOUS
  Administered 2012-10-07 (×2): 4 mg via INTRAVENOUS
  Administered 2012-10-07: 2 mg via INTRAVENOUS
  Administered 2012-10-07 (×2): 4 mg via INTRAVENOUS
  Administered 2012-10-07: 2 mg via INTRAVENOUS
  Administered 2012-10-07: 4 mg via INTRAVENOUS
  Administered 2012-10-08: 2 mg via INTRAVENOUS
  Administered 2012-10-08 (×4): 4 mg via INTRAVENOUS
  Filled 2012-10-06 (×2): qty 1
  Filled 2012-10-06 (×7): qty 2
  Filled 2012-10-06: qty 1
  Filled 2012-10-06 (×6): qty 2

## 2012-10-06 MED ORDER — DIPHENHYDRAMINE HCL 12.5 MG/5ML PO ELIX: 12.5000 mg | ORAL_SOLUTION | Freq: Four times a day (QID) | ORAL | Status: DC | PRN

## 2012-10-06 MED ORDER — SODIUM CHLORIDE 0.9 % IJ SOLN: 9.0000 mL | INTRAMUSCULAR | Status: DC | PRN

## 2012-10-06 MED ORDER — HYDROMORPHONE HCL PF 2 MG/ML IJ SOLN
4.0000 mg | INTRAMUSCULAR | Status: DC | PRN
  Administered 2012-10-06: 4 mg via INTRAVENOUS
  Filled 2012-10-06: qty 2

## 2012-10-06 MED ORDER — HYDROMORPHONE HCL PF 2 MG/ML IJ SOLN
INTRAMUSCULAR | Status: AC
  Administered 2012-10-06: 4 mg via INTRAVENOUS
  Filled 2012-10-06: qty 2

## 2012-10-06 MED ORDER — DIPHENHYDRAMINE HCL 50 MG/ML IJ SOLN: 12.5000 mg | Freq: Four times a day (QID) | INTRAMUSCULAR | Status: DC | PRN

## 2012-10-06 MED ORDER — MENTHOL 3 MG MT LOZG
1.0000 | LOZENGE | OROMUCOSAL | Status: DC | PRN
  Filled 2012-10-06: qty 9

## 2012-10-06 MED ORDER — HYDROMORPHONE 0.3 MG/ML IV SOLN
INTRAVENOUS | Status: DC
  Filled 2012-10-06: qty 25

## 2012-10-06 MED ORDER — POLYETHYLENE GLYCOL 3350 17 G PO PACK
17.0000 g | PACK | Freq: Every day | ORAL | Status: DC
  Administered 2012-10-06 – 2012-10-11 (×6): 17 g via ORAL
  Filled 2012-10-06 (×7): qty 1

## 2012-10-06 MED ORDER — PROMETHAZINE HCL 25 MG/ML IJ SOLN
12.5000 mg | Freq: Four times a day (QID) | INTRAMUSCULAR | Status: AC | PRN
Start: 2012-10-06 — End: 2012-10-07
  Administered 2012-10-06 – 2012-10-07 (×2): 12.5 mg via INTRAVENOUS
  Filled 2012-10-06 (×3): qty 1

## 2012-10-06 MED ORDER — LEVOFLOXACIN IN D5W 500 MG/100ML IV SOLN
500.0000 mg | INTRAVENOUS | Status: DC
  Administered 2012-10-06 – 2012-10-10 (×4): 500 mg via INTRAVENOUS
  Filled 2012-10-06 (×5): qty 100

## 2012-10-06 MED ORDER — FOLIC ACID 1 MG PO TABS
1.0000 mg | ORAL_TABLET | Freq: Every day | ORAL | Status: DC
  Administered 2012-10-06 – 2012-10-12 (×7): 1 mg via ORAL
  Filled 2012-10-06 (×7): qty 1

## 2012-10-06 MED ORDER — HYDROMORPHONE HCL PF 2 MG/ML IJ SOLN
2.0000 mg | INTRAMUSCULAR | Status: DC | PRN
  Administered 2012-10-06 (×2): 4 mg via INTRAVENOUS
  Filled 2012-10-06 (×3): qty 2

## 2012-10-06 MED ORDER — NALOXONE HCL 0.4 MG/ML IJ SOLN: 0.4000 mg | INTRAMUSCULAR | Status: DC | PRN

## 2012-10-06 MED ORDER — GUAIFENESIN ER 600 MG PO TB12
600.0000 mg | ORAL_TABLET | Freq: Two times a day (BID) | ORAL | Status: DC
  Administered 2012-10-06 – 2012-10-10 (×8): 600 mg via ORAL
  Filled 2012-10-06 (×16): qty 1

## 2012-10-06 NOTE — Progress Notes (Addendum)
[facility] PROGRESS NOTE  [name] ZOX:[id number] DOB: [birth year] DOA: 10/05/2012 PCP: MATTHEWS,MICHELLE A., MD  Assessment/Plan: Active Problems:   Anemia   Leukocytosis, unspecified    1. Sickle Cell Anemia: Patient presented with 2 days of generalized pains, worse in neck and back, but characteristic of usual pattern of pain crisis. Retic count is also elevated at 616.6, an Hb has dropped overnight., from 9.2 to 7.7. Managing with ivi fluids, opioid analgesics and Folate.  2. Anemia: Has described above, HB has dropped overnight. This is secondary to sickle crisis, as well as hemodilution. Will follow CBCs and transfuse prn.   3. Fever/Leukocytosis: Patient presented with wcc 26.2, and has had a temperature of 101 overnight. She admits to a cough with tenacious phlegm over the prior 3 days. Fortunately, CXR is devoid of acute findings. Have ordered blood cultures and U/A, but will commence iv Levaquin.    Code Status: Full Code.  Family Communication:  Disposition Plan: To be determined.    Brief narrative: 22 y.o. female with history of trichotillomania, GERD, migraines, avascular necrosis, chronic back pain, depression, sickle cell disease, who presents to the ED with complaints of severe 10/10 pain all over her body, in her face, head, arms, back, and legs since 10 pm in the night of 10/04/12. She did not have any relief with her home pain medications. She denies having any fevers or chills or nausea or vomiting or chest pain or SOB. She was evaluated in her in the ED, administered IV Dilaudid a total of 5 mg without relief and was referred for medical admission.    Consultants:  N/A.   Procedures:  CXR.   Antibiotics:  N/A.   HPI/Subjective: Coughing intermittently. Still complaining of pain. No bowel movement since 10/03/12.   Objective: Vital signs in last 24 hours: Temp:  [98.6 F (37 C)-101 F (38.3 C)] 98.6 F (37 C) (05/24 0534) Pulse Rate:   [105-123] 105 (05/24 0534) Resp:  [16-25] 16 (05/24 0534) BP: (101-140)/(46-76) 122/67 mmHg (05/24 0534) SpO2:  [99 %-100 %] 99 % (05/24 0534) Weight:  [87.408 kg (192 lb 11.2 oz)] 87.408 kg (192 lb 11.2 oz) (05/24 0204) Weight change:  Last BM Date: 10/03/12  Intake/Output from previous day: 05/23 0701 - 05/24 0700 In: 800 [I.V.:800] Out: -      Physical Exam: General: Alert, communicative, fully oriented, not short of breath at rest.  HEENT:  Has alopecia. Moderate clinical pallor, no jaundice, no conjunctival injection or discharge. Hydration appears fair.  NECK:  Supple, JVP not seen, no carotid bruits, no palpable lymphadenopathy, no palpable goiter. CHEST:  Clinically clear to auscultation, no wheezes, no crackles. HEART:  Sounds 1 and 2 heard, normal, regular, no murmurs. ABDOMEN:  Full, soft, non-tender, no palpable organomegaly, no palpable masses, normal bowel sounds. GENITALIA:  Not examined. LOWER EXTREMITIES:  No pitting edema, palpable peripheral pulses. MUSCULOSKELETAL SYSTEM:  Unremarkable. CENTRAL NERVOUS SYSTEM:  No focal neurologic deficit on gross examination.  Lab Results:  Recent Labs  10/05/12 1825 10/06/12 0558  WBC 26.2* 29.9*  HGB 9.2* 7.7*  HCT 26.4* 22.1*  PLT 492* 408*    Recent Labs  10/05/12 1825 10/06/12 0558  NA 139 135  K 3.6 3.6  CL 104 102  CO2 25 24  GLUCOSE 85 114*  BUN 6 4*  CREATININE 0.58 0.57  CALCIUM 9.1 8.7   No results found for this or any previous visit (from the past 240 hour(s)).   Studies/Results: Dg  Chest 2 View  10/05/2012   *RADIOLOGY REPORT*  Clinical Data: Chest pain, shortness of breath, sickle cell crisis  CHEST - 2 VIEW  Comparison: 08/27/2012  Findings: Lung volumes are low with crowding of the bronchovascular markings.  Heart size mildly enlarged with central vascular congestion.  No focal pulmonary opacity.  No pleural effusion.  No acute osseous finding.  No significant change in fish mouth deformity  of vertebral bodies typical for sickle cell disease. Cholecystectomy clips noted.  IMPRESSION: Low lung volumes with mild cardiomegaly and central vascular congestion.   Original Report Authenticated By: Christiana Pellant, M.D.    Medications: Scheduled Meds: . enoxaparin (LOVENOX) injection  40 mg Subcutaneous Q24H  . ketorolac  30 mg Intravenous Once   Continuous Infusions: . dextrose 5 % and 0.45% NaCl 100 mL/hr at 10/05/12 2342   PRN Meds:.acetaminophen, acetaminophen, alum & mag hydroxide-simeth, HYDROmorphone (DILAUDID) injection, menthol-cetylpyridinium, ondansetron (ZOFRAN) IV, ondansetron, oxyCODONE, zolpidem    LOS: 1 day   Earlena Werst,CHRISTOPHER  [facility] Pager [phone]. If 8PM-8AM, please contact night-coverage at [email or web address], password [email or web address] 10/06/2012, 8:33 AM  LOS: 1 day

## 2012-10-07 DIAGNOSIS — D5700 Hb-SS disease with crisis, unspecified: Principal | ICD-10-CM

## 2012-10-07 LAB — CBC WITH DIFFERENTIAL/PLATELET
Basophils Relative: 0 % (ref 0–1)
HCT: 19.8 % — ABNORMAL LOW (ref 36.0–46.0)
Hemoglobin: 6.8 g/dL — CL (ref 12.0–15.0)
Lymphocytes Relative: 13 % (ref 12–46)
MCHC: 34.3 g/dL (ref 30.0–36.0)
MCV: 87.2 fL (ref 78.0–100.0)
Monocytes Relative: 10 % (ref 3–12)
Neutro Abs: 17.8 10*3/uL — ABNORMAL HIGH (ref 1.7–7.7)
WBC: 23.8 10*3/uL — ABNORMAL HIGH (ref 4.0–10.5)

## 2012-10-07 LAB — COMPREHENSIVE METABOLIC PANEL
Alkaline Phosphatase: 95 U/L (ref 39–117)
BUN: 8 mg/dL (ref 6–23)
Chloride: 99 mEq/L (ref 96–112)
GFR calc Af Amer: >90 mL/min (ref >90)
GFR calc non Af Amer: >90 mL/min (ref >90)
Glucose, Bld: 104 mg/dL — ABNORMAL HIGH (ref 70–99)
Potassium: 3.8 mEq/L (ref 3.5–5.1)
Total Bilirubin: 4.2 mg/dL — ABNORMAL HIGH (ref 0.3–1.2)

## 2012-10-07 MED ORDER — ENSURE COMPLETE PO LIQD
237.0000 mL | Freq: Two times a day (BID) | ORAL | Status: DC
  Administered 2012-10-07 – 2012-10-12 (×7): 237 mL via ORAL

## 2012-10-07 MED ORDER — KETOROLAC TROMETHAMINE 30 MG/ML IJ SOLN
30.0000 mg | Freq: Four times a day (QID) | INTRAMUSCULAR | Status: AC
  Administered 2012-10-07 – 2012-10-10 (×7): 30 mg via INTRAVENOUS
  Filled 2012-10-07 (×13): qty 1

## 2012-10-07 MED ORDER — OXYCODONE HCL ER 10 MG PO T12A
10.0000 mg | EXTENDED_RELEASE_TABLET | Freq: Two times a day (BID) | ORAL | Status: DC
  Administered 2012-10-07 – 2012-10-08 (×3): 10 mg via ORAL
  Filled 2012-10-07 (×4): qty 1

## 2012-10-07 MED ORDER — HYDROXYUREA 500 MG PO CAPS
1000.0000 mg | ORAL_CAPSULE | Freq: Every day | ORAL | Status: DC
  Administered 2012-10-07 – 2012-10-08 (×2): 1000 mg via ORAL
  Filled 2012-10-07 (×3): qty 2

## 2012-10-07 NOTE — Progress Notes (Signed)
Nutrition Brief Note  Patient identified on the Malnutrition Screening Tool (MST) Report  Body mass index is 34.14 kg/(m^2). Patient meets criteria for obesity grade 1 based on current BMI.   Wt Readings from Last 10 Encounters:  10/06/12 192 lb 11.2 oz (87.408 kg)  08/31/12 185 lb 6.4 oz (84.097 kg)  07/25/12 190 lb 14.4 oz (86.592 kg)  06/27/12 181 lb 14.4 oz (82.509 kg)  04/04/12 201 lb 0.9 oz (91.199 kg)  03/04/12 194 lb 10.7 oz (88.3 kg)  02/13/12 194 lb 10.7 oz (88.3 kg)  01/11/12 197 lb 12.8 oz (89.721 kg)  12/22/11 197 lb 8.5 oz (89.6 kg) (97%*, Z = 1.90)  12/12/11 179 lb 0.2 oz (81.2 kg) (94%*, Z = 1.57)   * Growth percentiles are based on CDC 2-20 Years data.     Current diet order is regular, patient is consuming approximately 75% of meals at this time. Labs and medications reviewed.   Patient reports decreased intake for the past 2 days.  Requests Ensure while intake is low.  Will order Ensure Complete po BID, each supplement provides 350 kcal and 13 grams of protein.   Oran Rein, RD, LDN Clinical Inpatient Dietitian Pager:  [phone] Weekend and after hours pager:  [phone]

## 2012-10-07 NOTE — Progress Notes (Signed)
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 Patient was also fever and cough. No evidence of pneumonia but seems possible bronchitis. She is feeling better today, playing the pain is currently down to 6/10, she is worried mainly about her psychological problem. No fever today. She has palpitations but no chest pain. No shortness of breath. Patient coughed out a large greenish bloody sputum one time. Since then she seemed to feel better.  Objective: Vital signs in last 24 hours: Temp:  [98.4 F (36.9 C)-99.3 F (37.4 C)] 98.4 F (36.9 C) (05/25 0610) Pulse Rate:  [94-121] 94 (05/25 0610) Resp:  [18-19] 19 (05/25 0610) BP: (114-124)/(58-85) 117/81 mmHg (05/25 0610) SpO2:  [96 %-98 %] 96 % (05/25 0610) Weight change:  Last BM Date: 10/03/12  Intake/Output from previous day: 05/24 0701 - 05/25 0700 In: 2400 [I.V.:2300; IV Piggyback:100] Out: 1850 [Urine:1850] Intake/Output this shift:    General appearance: alert, cooperative, appears stated age and no distress Eyes: conjunctivae/corneas clear. PERRL, EOM's intact. Fundi benign. Neck: no adenopathy, no carotid bruit, no JVD, supple, symmetrical, trachea midline and thyroid not enlarged, symmetric, no tenderness/mass/nodules Resp: clear to auscultation bilaterally Chest wall: no tenderness, right sided chest wall tenderness Cardio: regular rate and rhythm, S1, S2 normal, no murmur, click, rub or gallop GI: soft, non-tender; bowel sounds normal; no masses,  no organomegaly Extremities: extremities normal, atraumatic, no cyanosis or edema Pulses: 2+ and symmetric Skin: Skin color, texture, turgor normal. No rashes or lesions Lymph nodes: Cervical, supraclavicular, and axillary nodes normal. Neurologic: Grossly normal  Lab Results:  Recent Labs  10/06/12 0558 10/07/12 0600  WBC 29.9* PENDING  HGB 7.7* 6.8*  HCT 22.1* 19.8*  PLT 408* PENDING   BMET  Recent Labs  10/06/12 0558  10/07/12 0600  NA 135 135  K 3.6 3.8  CL 102 99  CO2 24 25  GLUCOSE 114* 104*  BUN 4* 8  CREATININE 0.57 0.59  CALCIUM 8.7 9.0    Studies/Results: Dg Chest 2 View  10/05/2012   *RADIOLOGY REPORT*  Clinical Data: Chest pain, shortness of breath, sickle cell crisis  CHEST - 2 VIEW  Comparison: 08/27/2012  Findings: Lung volumes are low with crowding of the bronchovascular markings.  Heart size mildly enlarged with central vascular congestion.  No focal pulmonary opacity.  No pleural effusion.  No acute osseous finding.  No significant change in fish mouth deformity of vertebral bodies typical for sickle cell disease. Cholecystectomy clips noted.  IMPRESSION: Low lung volumes with mild cardiomegaly and central vascular congestion.   Original Report Authenticated By: Christiana Pellant, M.D.    Medications: I have reviewed the patient's current medications.  Assessment/Plan: 22 year old female with sickle cell painful crisis, bronchitis fever and leukocytosis.   #1 sickle cell painful crisis: Patient sent to be doing better today. I would not make any changes today on her current regimen. I would reduce the dose of IV Dilaudid in the morning in preparation for possible discharge tomorrow or the next day..  #2 sickle cell anemia: Hemoglobin has dropped gradually currently at 6.7. We will recheck in the morning. No evidence of active hemolysis this is more dilutional.  #3 fever and leukocytosis: Most likely secondary to person tract infection. Patient has Levaquin ordered. I will continue treatment for 5 days.  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  #5 tobacco abuse: Patient has been counseled. Nicotine patch has been offered.  LOS: 2 days   Amandine Covino,LAWAL 10/07/2012, 9:01 AM

## 2012-10-08 LAB — CBC WITH DIFFERENTIAL/PLATELET
Basophils Absolute: 0.2 10*3/uL — ABNORMAL HIGH (ref 0.0–0.1)
Lymphocytes Relative: 23 % (ref 12–46)
Lymphs Abs: 4.6 10*3/uL — ABNORMAL HIGH (ref 0.7–4.0)
Monocytes Relative: 11 % (ref 3–12)
Platelets: 420 10*3/uL — ABNORMAL HIGH (ref 150–400)
RDW: 18.6 % — ABNORMAL HIGH (ref 11.5–15.5)
WBC: 19.9 10*3/uL — ABNORMAL HIGH (ref 4.0–10.5)

## 2012-10-08 MED ORDER — HYDROMORPHONE HCL PF 2 MG/ML IJ SOLN
2.0000 mg | INTRAMUSCULAR | Status: DC | PRN
  Administered 2012-10-08 (×3): 2 mg via INTRAVENOUS
  Filled 2012-10-08 (×3): qty 1

## 2012-10-08 NOTE — Progress Notes (Signed)
Subjective: 22 year old female with sickle cell painful crisis admitted 3 days ago. She is doing better now with pain down to 5/10. She is having some diarrhea this morning. No nausea or vomiting. No fever, no SOB. Objective: Vital signs in last 24 hours: Temp:  [98.2 F (36.8 C)-98.6 F (37 C)] 98.2 F (36.8 C) (05/26 1434) Pulse Rate:  [92-107] 107 (05/26 1434) Resp:  [18] 18 (05/26 1434) BP: (108-149)/(47-114) 108/62 mmHg (05/26 1434) SpO2:  [95 %-100 %] 95 % (05/26 1434) Weight change:  Last BM Date: 10/07/12  Intake/Output from previous day: 05/25 0701 - 05/26 0700 In: 3698.3 [P.O.:1200; I.V.:2498.3] Out: 2350 [Urine:2350] Intake/Output this shift: Total I/O In: 120 [P.O.:120] Out: 1650 [Urine:1650]  General appearance: alert, cooperative, appears stated age and no distress Eyes: conjunctivae/corneas clear. PERRL, EOM's intact. Fundi benign. Neck: no adenopathy, no carotid bruit, no JVD, supple, symmetrical, trachea midline and thyroid not enlarged, symmetric, no tenderness/mass/nodules Resp: clear to auscultation bilaterally Chest wall: no tenderness, right sided chest wall tenderness Cardio: regular rate and rhythm, S1, S2 normal, no murmur, click, rub or gallop GI: soft, non-tender; bowel sounds normal; no masses,  no organomegaly Extremities: extremities normal, atraumatic, no cyanosis or edema Pulses: 2+ and symmetric Skin: Skin color, texture, turgor normal. No rashes or lesions Lymph nodes: Cervical, supraclavicular, and axillary nodes normal. Neurologic: Grossly normal  Lab Results:  Recent Labs  10/07/12 0600 10/08/12 0448  WBC 23.8* 19.9*  HGB 6.8* 6.5*  HCT 19.8* 18.4*  PLT 358 420*   BMET  Recent Labs  10/06/12 0558 10/07/12 0600  NA 135 135  K 3.6 3.8  CL 102 99  CO2 24 25  GLUCOSE 114* 104*  BUN 4* 8  CREATININE 0.57 0.59  CALCIUM 8.7 9.0    Studies/Results: No results found.  Medications: I have reviewed the patient's current  medications.  Assessment/Plan: 22 year old female with sickle cell painful crisis, bronchitis fever and leukocytosis.   #1 sickle cell painful crisis: Patient doing better. Has been able to move around but still having pain. I will decrease the dose of her IV dilaudid to 2 mg q2 hours and start titrating off tomorrow.  #2 sickle cell anemia: Hemoglobin has dropped gradually currently at 6.5. We will recheck in the morning. May need to transfuse PRBC in am if dropping further.  #3 fever and leukocytosis: Improved. Continue levaquin.  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  #5 tobacco abuse: Patient has been counseled.   LOS: 3 days   GARBA,LAWAL 10/08/2012, 5:47 PM

## 2012-10-09 DIAGNOSIS — K5900 Constipation, unspecified: Secondary | ICD-10-CM

## 2012-10-09 LAB — CBC WITH DIFFERENTIAL/PLATELET
Basophils Absolute: 0.2 10*3/uL — ABNORMAL HIGH (ref 0.0–0.1)
Basophils Relative: 1 % (ref 0–1)
Blasts: 0 %
Hemoglobin: 6.2 g/dL — CL (ref 12.0–15.0)
Lymphocytes Relative: 19 % (ref 12–46)
Lymphs Abs: 4.7 10*3/uL — ABNORMAL HIGH (ref 0.7–4.0)
MCHC: 34.6 g/dL (ref 30.0–36.0)
Neutro Abs: 18.1 10*3/uL — ABNORMAL HIGH (ref 1.7–7.7)
Neutrophils Relative %: 73 % (ref 43–77)
Promyelocytes Absolute: 0 %
nRBC: 5 /100 WBC — ABNORMAL HIGH

## 2012-10-09 LAB — COMPREHENSIVE METABOLIC PANEL
ALT: 25 U/L (ref 0–35)
AST: 31 U/L (ref 0–37)
Alkaline Phosphatase: 79 U/L (ref 39–117)
CO2: 29 mEq/L (ref 19–32)
Chloride: 100 mEq/L (ref 96–112)
GFR calc Af Amer: >90 mL/min (ref >90)
GFR calc non Af Amer: >90 mL/min (ref >90)
Glucose, Bld: 100 mg/dL — ABNORMAL HIGH (ref 70–99)
Potassium: 4 mEq/L (ref 3.5–5.1)
Sodium: 135 mEq/L (ref 135–145)

## 2012-10-09 MED ORDER — HYDROMORPHONE HCL PF 2 MG/ML IJ SOLN
2.0000 mg | Freq: Once | INTRAMUSCULAR | Status: DC
  Filled 2012-10-09 (×6): qty 1

## 2012-10-09 MED ORDER — OXYCODONE HCL 5 MG PO TABS: 5.0000 mg | ORAL_TABLET | ORAL | Status: DC

## 2012-10-09 MED ORDER — LEVOFLOXACIN 500 MG PO TABS
500.0000 mg | ORAL_TABLET | Freq: Once | ORAL | Status: AC
  Administered 2012-10-09: 500 mg via ORAL
  Filled 2012-10-09: qty 1

## 2012-10-09 MED ORDER — HYDROMORPHONE HCL PF 2 MG/ML IJ SOLN
2.0000 mg | INTRAMUSCULAR | Status: DC | PRN
  Administered 2012-10-09 (×2): 2 mg via INTRAMUSCULAR
  Filled 2012-10-09: qty 1

## 2012-10-09 MED ORDER — SENNOSIDES-DOCUSATE SODIUM 8.6-50 MG PO TABS
1.0000 | ORAL_TABLET | Freq: Two times a day (BID) | ORAL | Status: DC
  Administered 2012-10-09 – 2012-10-12 (×6): 1 via ORAL
  Filled 2012-10-09 (×9): qty 1

## 2012-10-09 MED ORDER — HYDROMORPHONE HCL 2 MG PO TABS
2.0000 mg | ORAL_TABLET | ORAL | Status: DC | PRN
  Administered 2012-10-09 (×2): 2 mg via ORAL
  Filled 2012-10-09 (×3): qty 1

## 2012-10-09 MED ORDER — OXYCODONE HCL 5 MG PO TABS
20.0000 mg | ORAL_TABLET | ORAL | Status: DC
  Administered 2012-10-09 – 2012-10-12 (×13): 20 mg via ORAL
  Filled 2012-10-09 (×16): qty 4

## 2012-10-09 NOTE — Progress Notes (Signed)
Call from Dr. Molli Hazard, order for PICC given. Spoke with IV Team and put pt on PICC list, made them aware Hgb 6.2, order to T&C, need for IV pain medication as pt has sickle cell, and IV fluids.

## 2012-10-09 NOTE — Progress Notes (Signed)
Spoke with Dr. Ashley Royalty, states she will assess pt before ordering PICC.

## 2012-10-09 NOTE — Progress Notes (Signed)
Attempt PICC placement for IV access - right arm scanned to locate adequate vein for placement but veins small and narrow as advance up arm, not good visualization with [place]; left arm scanned and Brachial veins appeared adequate for access (19%) with [place]. Left Brachial vein accessed x 2 but unable to advance guidewire with success in either brachial vein - no further attempts to place PICC - RN to notify MD.

## 2012-10-09 NOTE — Care Management Note (Signed)
    Page 1 of 1   10/09/2012     3:17:10 PM   CARE MANAGEMENT NOTE 10/09/2012  Patient:  [name], [name]   Account Number:  [id number]  Date Initiated:  10/09/2012  Documentation initiated by:  Lorenda Ishihara  Subjective/Objective Assessment:   22 yo female admitted with SS crisis. PTA lived at home with mother.     Action/Plan:   Home when stable   Anticipated DC Date:  10/11/2012   Anticipated DC Plan:  HOME/SELF CARE      DC Planning Services  CM consult      Choice offered to / List presented to:             Status of service:  Completed, signed off Medicare Important Message given?   (If response is "NO", the following Medicare IM given date fields will be blank) Date Medicare IM given:   Date Additional Medicare IM given:    Discharge Disposition:  HOME/SELF CARE  Per UR Regulation:  Reviewed for med. necessity/level of care/duration of stay  If discussed at Long Length of Stay Meetings, dates discussed:    Comments:

## 2012-10-09 NOTE — Progress Notes (Signed)
Page to IV team to consult about start of PIV. Will page MD for PICC line order.

## 2012-10-09 NOTE — Progress Notes (Signed)
Page to Dr. Ashley Royalty regarding need for PICC.

## 2012-10-09 NOTE — Progress Notes (Signed)
CRITICAL VALUE ALERT  Critical value received:  Hemoglobin 6.2   Date of notification:  10/09/12  Time of notification:  0534  Critical value read back:yes  Nurse who received alert:  Ernesta Amble RN    MD notified (1st page):  [phone]   Time of first page:  Craige Cotta  MD notified (2nd page):  Time of second page:  Responding MD:  Craige Cotta  Time MD responded:  [phone]

## 2012-10-09 NOTE — Progress Notes (Signed)
Patient requesting picc line placed, IV team un-successful in placing Peripheral IV, will pass on to next nurse to inform MD

## 2012-10-09 NOTE — Progress Notes (Signed)
SICKLE CELL SERVICE PROGRESS NOTE  [name] ZOX:[id number] DOB: 1991/03/11 DOA: 10/05/2012 PCP: Eligah Anello A., MD  Assessment/Plan: Active Problems:   Anemia   Leukocytosis, unspecified   Fever, unspecified  1. Sickle cell anemia with crisis: Patient had a recent long drive from [place] after having been on vacation. This likely triggered crisis. The patient has had no IV access for approximately the last 8 hours, thus has had a delay in  her treatment in terms of her administration of IV analgesics including Dilaudid and ketorolac. I will change her Dilaudid 2 IM route and resume her ketorolac IV access is reestablished. This patient currently is normal long-acting medication and I will discontinue OxyContin and resume the patient's oral home dose of 20 mg every 4 hours of oxycodone. The plan is to eventually taper down the dose of her intermittent short acting medication however it would be inappropriate to attempt at in the setting of acute crisis. Please note that her previous regimen of 10 mg of OxyContin every 12 hours would be an  underdosing of what she normally requires in a day which is approximately 80 mg of oxycodone.  2. Leukocytosis: Pt has a baseline WBC of 14-14.5. Leukocytosis is likely secondary to acute VOC as patient has no evidence of an infection. Will continue symptom management and monitor WBC count.  3. Acute hemolytic anemia: Pt with elevated bilirubin in setting of falling hemoglobin. I will consider transfusion if hemolysis continues and patient is symptomatic.  4. SCD: Pt on Hydrea and Foloc acid. However hydrea on hold secondary to Hb of 6.2.  5. Constipation: Will place on Senna-S and Miralax as needed.    Code Status: full code Family Communication: N/A Disposition Plan: Home in approximately 48 hours  Jafeth Mustin A.  Pager [phone]. If 7PM-7AM, please contact night-coverage.  10/09/2012, 10:42 AM  LOS: 4 days   Brief  narrative: [name] is a 22 y.o. female with Sickle Cell Anemia who presents to the ED with complaints of severe 10/10 pain all over her body, in her face, head, arms, back, and legs since 10 pm last night. She did not have any relief with her home pain medications. She denies having any fevers or chills or nausea or vomiting or chest pain or SOB. She was evaluated in her in the ED and administered IV Dilaudid a total of 5 mg without relief and was referred for medical admission.   Consultants:  None  Procedures:  None  Antibiotics:  None  HPI/Subjective: Pt states that pain is localized to legs and back. She rates pain as 9/10 which decreases to 6/10 with IV analgesics. However, due to loss of IV access, patient has not received any IV analgesics since 11.06 last night. She states that her current regimen has been inadequate. Pt has not had a BM for 2 days.  Objective: Filed Vitals:   10/08/12 1758 10/08/12 2115 10/09/12 0538 10/09/12 1024  BP: 117/64 115/64 117/64 88/59  Pulse: 103 105 97 92  Temp: 97.9 F (36.6 C) 99.5 F (37.5 C) 98.5 F (36.9 C) 97.8 F (36.6 C)  TempSrc: Oral Oral Oral Oral  Resp: 18 18 19 20   Height:      Weight:      SpO2: 95% 96% 97% 94%   Weight change:   Intake/Output Summary (Last 24 hours) at 10/09/12 1042 Last data filed at 10/09/12 0544  Gross per 24 hour  Intake    360 ml  Output  1850 ml  Net  -1490 ml    General: Alert, awake, oriented x3, in no acute distress.  HEENT: [place]/AT PEERL, EOMI Neck: Trachea midline,  no masses, no thyromegal,y no JVD, no carotid bruit OROPHARYNX:  Moist, No exudate/ erythema/lesions.  Heart: Regular rate and rhythm, without murmurs, rubs, gallops, PMI non-displaced, no heaves or thrills on palpation.  Lungs: Clear to auscultation, no wheezing or rhonchi noted. No increased vocal fremitus resonant to percussion  Abdomen: Soft, nontender, nondistended, positive bowel sounds, no masses no  hepatosplenomegaly noted..  Neuro: No focal neurological deficits noted cranial nerves II through XII grossly intact.  Strength fucntional in bilateral upper and lower extremities. Musculoskeletal: No warm swelling or erythema around joints. Psychiatric: Patient alert and oriented x3, good insight and cognition, good recent to remote recall.    Data Reviewed: Basic Metabolic Panel:  Recent Labs Lab 10/05/12 1825 10/06/12 0558 10/07/12 0600 10/09/12 0457  NA 139 135 135 135  K 3.6 3.6 3.8 4.0  CL 104 102 99 100  CO2 25 24 25 29   GLUCOSE 85 114* 104* 100*  BUN 6 4* 8 7  CREATININE 0.58 0.57 0.59 0.74  CALCIUM 9.1 8.7 9.0 9.0   Liver Function Tests:  Recent Labs Lab 10/07/12 0600 10/09/12 0457  AST 30 31  ALT 19 25  ALKPHOS 95 79  BILITOT 4.2* 2.2*  PROT 7.2 6.7  ALBUMIN 3.6 3.3*   No results found for this basename: LIPASE, AMYLASE,  in the last 168 hours No results found for this basename: AMMONIA,  in the last 168 hours CBC:  Recent Labs Lab 10/05/12 1825 10/06/12 0558 10/07/12 0600 10/08/12 0448 10/09/12 0457  WBC 26.2* 29.9* 23.8* 19.9* 24.7*  NEUTROABS 22.3*  --  17.8* 12.1* 18.1*  HGB 9.2* 7.7* 6.8* 6.5* 6.2*  HCT 26.4* 22.1* 19.8* 18.4* 17.9*  MCV 89.5 89.8 87.2 87.6 89.9  PLT 492* 408* 358 420* 466*   Cardiac Enzymes: No results found for this basename: CKTOTAL, CKMB, CKMBINDEX, TROPONINI,  in the last 168 hours BNP (last 3 results)  Recent Labs  10/25/11 0337 10/26/11 0720 11/21/11 1220  PROBNP 41.7 29.9 16.2   CBG: No results found for this basename: GLUCAP,  in the last 168 hours  Recent Results (from the past 240 hour(s))  CULTURE, BLOOD (ROUTINE X 2)     Status: None   Collection Time    10/06/12 12:00 PM      Result Value Range Status   Specimen Description BLOOD LEFT HAND   Final   Special Requests     Final   Value: BOTTLES DRAWN AEROBIC AND ANAEROBIC 3 CC RED, 2 CC BLUE   Culture  Setup Time 10/06/2012 18:10   Final    Culture     Final   Value:        BLOOD CULTURE RECEIVED NO GROWTH TO DATE CULTURE WILL BE HELD FOR 5 DAYS BEFORE ISSUING A FINAL NEGATIVE REPORT   Report Status PENDING   Incomplete  CULTURE, BLOOD (ROUTINE X 2)     Status: None   Collection Time    10/06/12 12:16 PM      Result Value Range Status   Specimen Description BLOOD LEFT ARM   Final   Special Requests     Final   Value: BOTTLES DRAWN AEROBIC AND ANAEROBIC 2 CC RED 3 CC BLUE   Culture  Setup Time 10/06/2012 18:09   Final   Culture     Final  Value:        BLOOD CULTURE RECEIVED NO GROWTH TO DATE CULTURE WILL BE HELD FOR 5 DAYS BEFORE ISSUING A FINAL NEGATIVE REPORT   Report Status PENDING   Incomplete     Studies: Dg Chest 2 View  10/05/2012   *RADIOLOGY REPORT*  Clinical Data: Chest pain, shortness of breath, sickle cell crisis  CHEST - 2 VIEW  Comparison: 08/27/2012  Findings: Lung volumes are low with crowding of the bronchovascular markings.  Heart size mildly enlarged with central vascular congestion.  No focal pulmonary opacity.  No pleural effusion.  No acute osseous finding.  No significant change in fish mouth deformity of vertebral bodies typical for sickle cell disease. Cholecystectomy clips noted.  IMPRESSION: Low lung volumes with mild cardiomegaly and central vascular congestion.   Original Report Authenticated By: Christiana Pellant, M.D.    Scheduled Meds: . enoxaparin (LOVENOX) injection  40 mg Subcutaneous Q24H  . feeding supplement  237 mL Oral BID BM  . folic acid  1 mg Oral Daily  . guaiFENesin  600 mg Oral BID  .  HYDROmorphone (DILAUDID) injection  2 mg Intramuscular Once  . ketorolac  30 mg Intravenous Q6H  . levofloxacin (LEVAQUIN) IV  500 mg Intravenous Q24H  . oxyCODONE  20 mg Oral Q4H  . polyethylene glycol  17 g Oral Daily   Continuous Infusions: . dextrose 5 % and 0.45% NaCl 100 mL/hr (10/08/12 1853)   Time spent 39 minutes.

## 2012-10-09 NOTE — Progress Notes (Signed)
IR order placed. Spoke to [address] in [employer] and made aware of need for PICC placement, states she is unsure if they will be able to get to later today or at all today. Will make Dr. Ashley Royalty aware.

## 2012-10-09 NOTE — Progress Notes (Signed)
Pt called for pain med, when writer entered room, as for each time but once today pt is snoring. Pt aroused by touch and then states she want pain medication. Upon returning with Dilaudid IM as ordered pt refused again. She quickly went back to sleep.

## 2012-10-10 ENCOUNTER — Inpatient Hospital Stay (HOSPITAL_COMMUNITY): Payer: Medicare Other

## 2012-10-10 LAB — COMPREHENSIVE METABOLIC PANEL
ALT: 33 U/L (ref 0–35)
AST: 28 U/L (ref 0–37)
Alkaline Phosphatase: 82 U/L (ref 39–117)
CO2: 26 mEq/L (ref 19–32)
Calcium: 9.3 mg/dL (ref 8.4–10.5)
Chloride: 101 mEq/L (ref 96–112)
GFR calc Af Amer: >90 mL/min (ref >90)
GFR calc non Af Amer: >90 mL/min (ref >90)
Glucose, Bld: 103 mg/dL — ABNORMAL HIGH (ref 70–99)
Sodium: 136 mEq/L (ref 135–145)
Total Bilirubin: 2.2 mg/dL — ABNORMAL HIGH (ref 0.3–1.2)

## 2012-10-10 LAB — CBC WITH DIFFERENTIAL/PLATELET
Blasts: 0 %
Eosinophils Absolute: 0.4 10*3/uL (ref 0.0–0.7)
Eosinophils Relative: 2 % (ref 0–5)
MCH: 31.3 pg (ref 26.0–34.0)
MCV: 92.6 fL (ref 78.0–100.0)
Metamyelocytes Relative: 0 %
Monocytes Relative: 6 % (ref 3–12)
Myelocytes: 0 %
Neutro Abs: 12 10*3/uL — ABNORMAL HIGH (ref 1.7–7.7)
Neutrophils Relative %: 60 % (ref 43–77)
Platelets: 447 10*3/uL — ABNORMAL HIGH (ref 150–400)
RBC: 2.3 MIL/uL — ABNORMAL LOW (ref 3.87–5.11)
RDW: 22.8 % — ABNORMAL HIGH (ref 11.5–15.5)
WBC: 20 10*3/uL — ABNORMAL HIGH (ref 4.0–10.5)
nRBC: 0 /100 WBC

## 2012-10-10 MED ORDER — HYDROXYUREA 500 MG PO CAPS
1000.0000 mg | ORAL_CAPSULE | Freq: Every day | ORAL | Status: DC
  Administered 2012-10-10 – 2012-10-11 (×2): 1000 mg via ORAL
  Filled 2012-10-10 (×4): qty 2

## 2012-10-10 MED ORDER — HYDROMORPHONE HCL PF 2 MG/ML IJ SOLN
2.0000 mg | INTRAMUSCULAR | Status: DC | PRN
  Administered 2012-10-10 – 2012-10-11 (×13): 2 mg via INTRAVENOUS
  Filled 2012-10-10 (×10): qty 1

## 2012-10-10 NOTE — Progress Notes (Signed)
Patient refused oxycodone dose.

## 2012-10-10 NOTE — Procedures (Signed)
US/fluoroscopic guided right DL basilic vein PICC placed. Length 37 cm. Tip SVC/RA junction. No immediate complications.  

## 2012-10-10 NOTE — Progress Notes (Signed)
24 gauge peripheral IV started in right hand.

## 2012-10-10 NOTE — Progress Notes (Signed)
Subjective: 22 year old female with sickle cell painful crisis. Patient has been having problem with IV access yesterday. Her medications were switched to oral and IM. She refused the doses this morning. According to her she was nauseated so she did not take the oral medicine. She was also sleeping basilar she did not get the IM. She has a PICC line in place and is getting IV medicine now. She is worried about high hemoglobin which was 6.2 yesterday and 7.2 today. Her usual hemoglobin is somewhere between 9 or 10 g. She is not nauseated now but has a right shoulder pain rated as 6/10. Objective: Vital signs in last 24 hours: Temp:  [97.6 F (36.4 C)-98.4 F (36.9 C)] 97.6 F (36.4 C) (05/28 1150) Pulse Rate:  [92-107] 96 (05/28 1150) Resp:  [16-19] 16 (05/28 1150) BP: (99-114)/(49-73) 102/53 mmHg (05/28 1150) SpO2:  [94 %-99 %] 96 % (05/28 1150) Weight change:  Last BM Date: 10/09/12  Intake/Output from previous day: 05/27 0701 - 05/28 0700 In: 1732 [P.O.:1200; I.V.:532] Out: -  Intake/Output this shift: Total I/O In: 797.5 [P.O.:120; I.V.:577.5; IV Piggyback:100] Out: -   General appearance: alert, cooperative, appears stated age and no distress Eyes: conjunctivae/corneas clear. PERRL, EOM's intact. Fundi benign. Neck: no adenopathy, no carotid bruit, no JVD, supple, symmetrical, trachea midline and thyroid not enlarged, symmetric, no tenderness/mass/nodules Resp: clear to auscultation bilaterally Chest wall: no tenderness, right sided chest wall tenderness Cardio: regular rate and rhythm, S1, S2 normal, no murmur, click, rub or gallop GI: soft, non-tender; bowel sounds normal; no masses,  no organomegaly Extremities: extremities normal, atraumatic, no cyanosis or edema Pulses: 2+ and symmetric Skin: Skin color, texture, turgor normal. No rashes or lesions Lymph nodes: Cervical, supraclavicular, and axillary nodes normal. Neurologic: Grossly normal  Lab Results:  Recent Labs   10/09/12 0457 10/10/12 0400  WBC 24.7* 20.0*  HGB 6.2* 7.2*  HCT 17.9* 21.3*  PLT 466* 447*   BMET  Recent Labs  10/09/12 0457 10/10/12 0400  NA 135 136  K 4.0 3.9  CL 100 101  CO2 29 26  GLUCOSE 100* 103*  BUN 7 7  CREATININE 0.74 0.71  CALCIUM 9.0 9.3    Studies/Results: Ir Fluoro Guide Cv Line Right  10/10/2012   *RADIOLOGY REPORT*  Clinical Data: Sickle cell anemia with crisis, poor venous access; request is made for central venous access for fluids and medications.  PICC LINE PLACEMENT WITH ULTRASOUND AND FLUOROSCOPIC  GUIDANCE  Fluoroscopy Time: 0.2 minutes  The right arm was prepped with chlorhexidine, draped in the usual sterile fashion using maximum barrier technique (cap and mask, sterile gown, sterile gloves, large sterile sheet, hand hygiene and cutaneous antisepsis) and infiltrated locally with 1% Lidocaine.  Ultrasound demonstrated patency of the right basilic vein, and this was documented with an image.  Under real-time ultrasound guidance, this vein was accessed with a 21 gauge micropuncture needle and image documentation was performed.  The needle was exchanged over a guidewire for a peel-away sheath through which a five [place] double lumen PICC trimmed to 37 cm was advanced, positioned with its tip at the lower SVC/right atrial junction.  Fluoroscopy during the procedure and fluoro spot radiograph confirms appropriate catheter position.  The catheter was flushed, secured to the skin with Prolene sutures, and covered with a sterile dressing.  Complications:  None  IMPRESSION: Successful right arm PICC line placement with ultrasound and fluoroscopic guidance.  The catheter is ready for use.  Read by: Jeananne Rama,  P.A.-C  IR RIGHT FLOURO GUIDE CV LINE  Fluoroscopy Time: dictate in minutes & seconds  Comparison: None.  Findings:  IMPRESSION:   Original Report Authenticated By: D. Andria Rhein, MD    Medications: I have reviewed the patient's current  medications.  Assessment/Plan: 22 year old female with sickle cell painful crisis  she has had bronchitis fever and leukocytosis being treated with Levaquin.  #1 sickle cell painful crisis: I discussed with the patient that the fact that she went to while without medications including both oral and IM medicine means that she has gotten better. She available would not plan the Dilaudid IV. She is having new pain. In her hemoglobin has not returned to her baseline. We will therefore get ready for discharge home this evening or first thing in the morning.  #2 sickle cell anemic crisis: Patient's hemoglobin is currently at 7.2. He was 6.2 g yesterday. Her usual hemoglobin is around 9-10 g. She is asking for blood transfusion but I made it clear that it seems her hemolytic anemia has resolved. I will check a CBC in the morning and if the hemoglobin trend seems to be going upwards, I will discharge her home.  #3 fever and leukocytosis: Secondary to bronchitis versus possible pneumonia. Discontinue levaquin since patient has been treated for 5 days so far.  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  #5 tobacco abuse: Patient has been counseled.   LOS: 5 days   [name],LAWAL 10/10/2012, 6:39 PM

## 2012-10-11 LAB — CBC
MCH: 31.1 pg (ref 26.0–34.0)
MCHC: 32.7 g/dL (ref 30.0–36.0)
Platelets: 418 10*3/uL — ABNORMAL HIGH (ref 150–400)
RDW: 24.7 % — ABNORMAL HIGH (ref 11.5–15.5)

## 2012-10-11 LAB — DIFFERENTIAL
Basophils Absolute: 0.2 10*3/uL — ABNORMAL HIGH (ref 0.0–0.1)
Eosinophils Relative: 3 % (ref 0–5)
Lymphocytes Relative: 21 % (ref 12–46)
Monocytes Relative: 10 % (ref 3–12)
Neutro Abs: 14 10*3/uL — ABNORMAL HIGH (ref 1.7–7.7)

## 2012-10-11 LAB — COMPREHENSIVE METABOLIC PANEL
ALT: 28 U/L (ref 0–35)
AST: 24 U/L (ref 0–37)
CO2: 28 mEq/L (ref 19–32)
Calcium: 8.8 mg/dL (ref 8.4–10.5)
Creatinine, Ser: 0.61 mg/dL (ref 0.50–1.10)
GFR calc non Af Amer: >90 mL/min (ref >90)
Sodium: 137 mEq/L (ref 135–145)
Total Protein: 6.9 g/dL (ref 6.0–8.3)

## 2012-10-11 LAB — HEMOGLOBIN AND HEMATOCRIT, BLOOD: Hemoglobin: 8 g/dL — ABNORMAL LOW (ref 12.0–15.0)

## 2012-10-11 LAB — C-REACTIVE PROTEIN: CRP: 2.2 mg/dL — ABNORMAL HIGH (ref <0.60)

## 2012-10-11 LAB — PREPARE RBC (CROSSMATCH)

## 2012-10-11 MED ORDER — HYDROMORPHONE HCL PF 2 MG/ML IJ SOLN
3.0000 mg | INTRAMUSCULAR | Status: DC | PRN
  Administered 2012-10-11 – 2012-10-12 (×7): 3 mg via INTRAVENOUS
  Filled 2012-10-11 (×7): qty 2

## 2012-10-11 NOTE — Progress Notes (Signed)
Patient refused oxyir scheduled dose. Stating will "take in morning".

## 2012-10-11 NOTE — Progress Notes (Signed)
Time was off checking vitals signs on the hour because the patient was pushing the silence button on the pump.

## 2012-10-11 NOTE — Progress Notes (Addendum)
CRITICAL VALUE ALERT  Critical value received:  Hemoglobin 6.5   Date of notification:  10/11/12  Time of notification:  0625  Critical value read back:yes  Nurse who received alert:  Greig Right   MD notified (1st page):  Craige Cotta NP for triad    Time of first page:  0630  MD notified (2nd page):dr [email or web address]   Time of second IONG:2952  Responding MD:    Time MD responded:

## 2012-10-11 NOTE — Progress Notes (Signed)
SICKLE CELL SERVICE PROGRESS NOTE  [name] ZOX:[id number] DOB: 1991/02/28 DOA: 10/05/2012 PCP: Tyrees Chopin A., MD  Assessment/Plan: Active Problems:     1. Sickle cell anemia with crisis: Patient states pain 6/10 and localized to joints. The patient will continue on her oral short acting medication scheduled and IV Dilaudid every 3 hours as rescue doses. The patient's baseline hemoglobin is approximately 8.5 and would likely benefit from a transfusion of one unit of packed red blood cells. Thus on transfusing the patient one unit of packed blood cells and anticipate that she will be ready for discharge within the next 24 hours.       Patient had a recent long drive from [place] after having been on vacation. This likely triggered         a crisis.   2. Leukocytosis: Pt has a baseline WBC of 14-14.5. Leukocytosis is likely secondary to acute VOC as patient has no evidence of an infection. I expect to see a decrease in white blood cell count posttransfusion. However I will also check a urinalysis to ensure the patient does not have a urinary tract infection that could be contributing to her back pain and elevation of white blood cell count.  3. Acute hemolytic anemia: Pt with elevated bilirubin in setting of falling hemoglobin. I will consider transfusion if hemolysis continues and patient is symptomatic.  4. SCD: Pt on Hydrea and Folic acid. Hydrea will continue to hold secondary to low hemoglobin.  5. Constipation: Will continue on Senna-S and Miralax as needed.    Code Status: full code Family Communication: N/A Disposition Plan: Home in approximately 48 hours  [name] A.  Pager [phone]. If 7PM-7AM, please contact night-coverage.  10/11/2012, 11:36 AM  LOS: 6 days   Brief narrative: [name] is a 22 y.o. female with Sickle Cell Anemia who presents to the ED with complaints of severe 10/10 pain all over her body, in her face, head, arms, back, and legs  since 10 pm last night. She did not have any relief with her home pain medications. She denies having any fevers or chills or nausea or vomiting or chest pain or SOB. She was evaluated in her in the ED and administered IV Dilaudid a total of 5 mg without relief and was referred for medical admission.   Consultants:  None  Procedures:  None  Antibiotics:  None  HPI/Subjective: Pt states that pain is localized to back. She rates pain as 6/10 with IV analgesics.  Objective: Filed Vitals:   10/11/12 0207 10/11/12 0555 10/11/12 0604 10/11/12 1005  BP: 127/64   102/56  Pulse: 98   98  Temp: 98.4 F (36.9 C)   98.4 F (36.9 C)  TempSrc: Oral   Oral  Resp: 18 18  18   Height:      Weight:   190 lb 11.2 oz (86.5 kg)   SpO2: 99%   98%   Weight change: 6 lb 9.8 oz (3 kg)  Intake/Output Summary (Last 24 hours) at 10/11/12 1136 Last data filed at 10/11/12 0555  Gross per 24 hour  Intake 3249.83 ml  Output      0 ml  Net 3249.83 ml    General: Alert, awake, oriented x3, in no acute distress.  HEENT: /AT PEERL, EOMI, anicteric Neck: Trachea midline,  no masses, no thyromegal,y no JVD, no carotid bruit OROPHARYNX:  Moist, No exudate/ erythema/lesions.  Heart: Regular rate and rhythm, without murmurs, rubs, gallops, PMI non-displaced, no heaves or  thrills on palpation.  Lungs: Clear to auscultation, no wheezing or rhonchi noted.  Abdomen: Soft, nontender, nondistended, positive bowel sounds, no masses no hepatosplenomegaly noted.  Neuro: No focal neurological deficits noted . Musculoskeletal: No warm swelling or erythema around joints.   Data Reviewed: Basic Metabolic Panel:  Recent Labs Lab 10/06/12 0558 10/07/12 0600 10/09/12 0457 10/10/12 0400 10/11/12 0905  NA 135 135 135 136 137  K 3.6 3.8 4.0 3.9 3.9  CL 102 99 100 101 102  CO2 24 25 29 26 28   GLUCOSE 114* 104* 100* 103* 103*  BUN 4* 8 7 7 7   CREATININE 0.57 0.59 0.74 0.71 0.61  CALCIUM 8.7 9.0 9.0 9.3 8.8    Liver Function Tests:  Recent Labs Lab 10/07/12 0600 10/09/12 0457 10/10/12 0400 10/11/12 0905  AST 30 31 28 24   ALT 19 25 33 28  ALKPHOS 95 79 82 74  BILITOT 4.2* 2.2* 2.2* 2.0*  PROT 7.2 6.7 7.9 6.9  ALBUMIN 3.6 3.3* 3.7 3.3*   No results found for this basename: LIPASE, AMYLASE,  in the last 168 hours No results found for this basename: AMMONIA,  in the last 168 hours CBC:  Recent Labs Lab 10/07/12 0600 10/08/12 0448 10/09/12 0457 10/10/12 0400 10/11/12 0600  WBC 23.8* 19.9* 24.7* 20.0* 21.5*  NEUTROABS 17.8* 12.1* 18.1* 12.0* 14.0*  HGB 6.8* 6.5* 6.2* 7.2* 6.5*  HCT 19.8* 18.4* 17.9* 21.3* 19.9*  MCV 87.2 87.6 89.9 92.6 95.2  PLT 358 420* 466* 447* 418*   Cardiac Enzymes: No results found for this basename: CKTOTAL, CKMB, CKMBINDEX, TROPONINI,  in the last 168 hours BNP (last 3 results)  Recent Labs  10/25/11 0337 10/26/11 0720 11/21/11 1220  PROBNP 41.7 29.9 16.2   CBG: No results found for this basename: GLUCAP,  in the last 168 hours  Recent Results (from the past 240 hour(s))  CULTURE, BLOOD (ROUTINE X 2)     Status: None   Collection Time    10/06/12 12:00 PM      Result Value Range Status   Specimen Description BLOOD LEFT HAND   Final   Special Requests     Final   Value: BOTTLES DRAWN AEROBIC AND ANAEROBIC 3 CC RED, 2 CC BLUE   Culture  Setup Time 10/06/2012 18:10   Final   Culture     Final   Value:        BLOOD CULTURE RECEIVED NO GROWTH TO DATE CULTURE WILL BE HELD FOR 5 DAYS BEFORE ISSUING A FINAL NEGATIVE REPORT   Report Status PENDING   Incomplete  CULTURE, BLOOD (ROUTINE X 2)     Status: None   Collection Time    10/06/12 12:16 PM      Result Value Range Status   Specimen Description BLOOD LEFT ARM   Final   Special Requests     Final   Value: BOTTLES DRAWN AEROBIC AND ANAEROBIC 2 CC RED 3 CC BLUE   Culture  Setup Time 10/06/2012 18:09   Final   Culture     Final   Value:        BLOOD CULTURE RECEIVED NO GROWTH TO DATE CULTURE  WILL BE HELD FOR 5 DAYS BEFORE ISSUING A FINAL NEGATIVE REPORT   Report Status PENDING   Incomplete     Studies: Dg Chest 2 View  10/05/2012   *RADIOLOGY REPORT*  Clinical Data: Chest pain, shortness of breath, sickle cell crisis  CHEST - 2 VIEW  Comparison: 08/27/2012  Findings: Lung volumes are low with crowding of the bronchovascular markings.  Heart size mildly enlarged with central vascular congestion.  No focal pulmonary opacity.  No pleural effusion.  No acute osseous finding.  No significant change in fish mouth deformity of vertebral bodies typical for sickle cell disease. Cholecystectomy clips noted.  IMPRESSION: Low lung volumes with mild cardiomegaly and central vascular congestion.   Original Report Authenticated By: Christiana Pellant, M.D.    Scheduled Meds: . enoxaparin (LOVENOX) injection  40 mg Subcutaneous Q24H  . feeding supplement  237 mL Oral BID BM  . folic acid  1 mg Oral Daily  . guaiFENesin  600 mg Oral BID  .  HYDROmorphone (DILAUDID) injection  2 mg Intramuscular Once  . hydroxyurea  1,000 mg Oral QHS  . oxyCODONE  20 mg Oral Q4H  . polyethylene glycol  17 g Oral Daily  . senna-docusate  1 tablet Oral BID   Continuous Infusions: . dextrose 5 % and 0.45% NaCl 1,000 mL (10/11/12 0554)   Time spent 35 minutes.

## 2012-10-12 LAB — CBC WITH DIFFERENTIAL/PLATELET
Basophils Relative: 0 % (ref 0–1)
Eosinophils Absolute: 0.7 10*3/uL (ref 0.0–0.7)
Eosinophils Relative: 4 % (ref 0–5)
Hemoglobin: 8.4 g/dL — ABNORMAL LOW (ref 12.0–15.0)
Lymphs Abs: 3.7 10*3/uL (ref 0.7–4.0)
MCH: 30.8 pg (ref 26.0–34.0)
MCHC: 34 g/dL (ref 30.0–36.0)
MCV: 90.5 fL (ref 78.0–100.0)
Monocytes Absolute: 1.8 10*3/uL — ABNORMAL HIGH (ref 0.1–1.0)
Neutrophils Relative %: 65 % (ref 43–77)

## 2012-10-12 LAB — URINALYSIS, ROUTINE W REFLEX MICROSCOPIC
Bilirubin Urine: NEGATIVE
Ketones, ur: NEGATIVE mg/dL
Leukocytes, UA: NEGATIVE
Nitrite: NEGATIVE
Specific Gravity, Urine: 1.012 (ref 1.005–1.030)
Urobilinogen, UA: 1 mg/dL (ref 0.0–1.0)

## 2012-10-12 LAB — TYPE AND SCREEN
Antibody Screen: POSITIVE
DAT, IgG: NEGATIVE

## 2012-10-12 LAB — CULTURE, BLOOD (ROUTINE X 2)
Culture: NO GROWTH
Culture: NO GROWTH

## 2012-10-12 MED ORDER — SENNA 8.6 MG PO TABS: 1.0000 | ORAL_TABLET | Freq: Two times a day (BID) | ORAL | Status: DC

## 2012-10-12 MED ORDER — OXYCODONE HCL 5 MG PO TABS: 15.0000 mg | ORAL_TABLET | Freq: Four times a day (QID) | ORAL | Status: DC | PRN

## 2012-10-12 MED ORDER — OXYCODONE HCL 15 MG PO TABS: 15.0000 mg | ORAL_TABLET | Freq: Four times a day (QID) | ORAL | Status: DC | PRN

## 2012-10-12 MED ORDER — HYDROXYUREA 500 MG PO CAPS: 1000.0000 mg | ORAL_CAPSULE | Freq: Every day | ORAL | Status: DC

## 2012-10-12 MED ORDER — OXYCODONE HCL 5 MG PO TABS: 5.0000 mg | ORAL_TABLET | Freq: Four times a day (QID) | ORAL | Status: DC | PRN

## 2012-10-12 MED ORDER — FOLIC ACID 1 MG PO TABS: 1.0000 mg | ORAL_TABLET | Freq: Every day | ORAL | Status: DC

## 2012-10-12 MED ORDER — POLYETHYLENE GLYCOL 3350 17 G PO PACK: 17.0000 g | PACK | Freq: Every day | ORAL | Status: DC

## 2012-10-12 MED ORDER — MORPHINE SULFATE 15 MG PO TABS: 15.0000 mg | ORAL_TABLET | Freq: Four times a day (QID) | ORAL | Status: DC | PRN

## 2012-10-12 MED ORDER — HYDROMORPHONE HCL PF 2 MG/ML IJ SOLN
3.0000 mg | INTRAMUSCULAR | Status: DC | PRN
  Administered 2012-10-12 (×3): 3 mg via INTRAVENOUS
  Filled 2012-10-12 (×3): qty 2

## 2012-10-12 NOTE — Progress Notes (Signed)
Patient had frank red blood in stool. NP notified, orders to discontinue Lovenox and place SCD's. Stat H&H drawn, Hgb 8.0.

## 2012-10-12 NOTE — Progress Notes (Signed)
Assessment/Plan: @PROBHOSP @  LOS: 7 days    Active Problems:  1. Sickle cell anemia with crisis: Patient states pain 6-7/10, she states her baseline is 4/10. She wishes to go home today. We discussed that with such a higher pain level she probably should stay at least an additional day, currently she declines but states if may reconsider based on her pain level during the course of the day . The patient will continue on her oral short acting medication scheduled and IV Dilaudid every 3 hours as rescue doses until discharge. The patient's baseline hemoglobin is approximately 8.5, she was transfused one unit packed red blood cells. 2. BRBPR: per patient it was a small amount. She states she was constipated and starining. There has been no recurrence. GI consult not needed. 3.  Leukocytosis: Pt has a baseline WBC ~ 14. Leukocytosis is likely secondary to acute VOC as patient has no evidence of an infection. Urinalysis and CXR are negative, no fever, no clinical sign of infection. 3. Acute hemolytic anemia: Pt with elevated bilirubin in setting of falling hemoglobin. 4. SCD: Pt on Hydrea and Folic acid. Hydrea will continue to hold secondary to low hemoglobin. 5. Constipation: Will continue on Senna-S and Miralax as needed.    Code Status: full code  Family Communication: N/A  Disposition Plan: Home in approximately 48 hours    Subjective: Patient seen, states pain unchanged 6-7/10. Pain along the back mostly left sided. Patient states she would like to go home but has to wait for her ride which will come around 2PM. She will continue to evaluate how she handles her pain, she may stay if she is uncomfortable. I am ok with her wishes to remain if she decided on this.  Objective: Weight change: -0.9 kg (-1 lb 15.8 oz)  Intake/Output Summary (Last 24 hours) at 10/12/12 1610 Last data filed at 10/12/12 0535  Gross per 24 hour  Intake 1437.33 ml  Output    800 ml  Net 637.33 ml   BP 107/72   Pulse 99  Temp(Src) 98.4 F (36.9 C) (Oral)  Resp 16  Ht 5\' 3"  (1.6 m)  Wt 85.6 kg (188 lb 11.4 oz)  BMI 33.44 kg/m2  SpO2 96%  LMP 10/01/2012  General Appearance:    Alert, cooperative, no distress, appears stated age  Head:    Normocephalic, without obvious abnormality, atraumatic  Eyes:    PERRL, conjunctiva/corneas clear     Nose:   Nares normal, septum midline, mucosa normal, no drainage    or sinus tenderness  Throat:   Lips, mucosa, and tongue normal; teeth and gums normal  Neck:   Supple, symmetrical, trachea midline, no adenopathy;    thyroid:  no enlargement/tenderness/nodules; no carotid   bruit or JVD  Back:     Symmetric, no curvature, ROM normal, no CVA tenderness  Lungs:     Clear to auscultation bilaterally, respirations unlabored  Chest Wall:    No tenderness or deformity   Heart:    Regular rate and rhythm, S1 and S2 normal, no murmur, rub   or gallop     Abdomen:     Soft, non-tender, bowel sounds active all four quadrants,    no masses, no organomegaly        Extremities:   Extremities normal, atraumatic, no cyanosis or edema  Pulses:   2+ and symmetric all extremities  Skin:   Skin color, texture, turgor normal, no rashes or lesions  Lymph nodes:   Cervical,  supraclavicular, and axillary nodes normal  Neurologic:   CNII-XII intact, normal strength, sensation and reflexes    throughout    Lab Results:  Recent Labs  10/10/12 0400 10/11/12 0905  NA 136 137  K 3.9 3.9  CL 101 102  CO2 26 28  GLUCOSE 103* 103*  BUN 7 7  CREATININE 0.71 0.61  CALCIUM 9.3 8.8    Recent Labs  10/10/12 0400 10/11/12 0905  AST 28 24  ALT 33 28  ALKPHOS 82 74  BILITOT 2.2* 2.0*  PROT 7.9 6.9  ALBUMIN 3.7 3.3*   No results found for this basename: LIPASE, AMYLASE,  in the last 72 hours  Recent Labs  10/10/12 0400 10/11/12 0600 10/11/12 2049  WBC 20.0* 21.5*  --   NEUTROABS 12.0* 14.0*  --   HGB 7.2* 6.5* 8.0*  HCT 21.3* 19.9* 24.9*  MCV 92.6 95.2  --    PLT 447* 418*  --    No results found for this basename: CKTOTAL, CKMB, CKMBINDEX, TROPONINI,  in the last 72 hours No components found with this basename: POCBNP,  No results found for this basename: DDIMER,  in the last 72 hours No results found for this basename: HGBA1C,  in the last 72 hours No results found for this basename: CHOL, HDL, LDLCALC, TRIG, CHOLHDL, LDLDIRECT,  in the last 72 hours No results found for this basename: TSH, T4TOTAL, FREET3, T3FREE, THYROIDAB,  in the last 72 hours No results found for this basename: VITAMINB12, FOLATE, FERRITIN, TIBC, IRON, RETICCTPCT,  in the last 72 hours  Micro Results: Recent Results (from the past 240 hour(s))  CULTURE, BLOOD (ROUTINE X 2)     Status: None   Collection Time    10/06/12 12:00 PM      Result Value Range Status   Specimen Description BLOOD LEFT HAND   Final   Special Requests     Final   Value: BOTTLES DRAWN AEROBIC AND ANAEROBIC 3 CC RED, 2 CC BLUE   Culture  Setup Time 10/06/2012 18:10   Final   Culture     Final   Value:        BLOOD CULTURE RECEIVED NO GROWTH TO DATE CULTURE WILL BE HELD FOR 5 DAYS BEFORE ISSUING A FINAL NEGATIVE REPORT   Report Status PENDING   Incomplete  CULTURE, BLOOD (ROUTINE X 2)     Status: None   Collection Time    10/06/12 12:16 PM      Result Value Range Status   Specimen Description BLOOD LEFT ARM   Final   Special Requests     Final   Value: BOTTLES DRAWN AEROBIC AND ANAEROBIC 2 CC RED 3 CC BLUE   Culture  Setup Time 10/06/2012 18:09   Final   Culture     Final   Value:        BLOOD CULTURE RECEIVED NO GROWTH TO DATE CULTURE WILL BE HELD FOR 5 DAYS BEFORE ISSUING A FINAL NEGATIVE REPORT   Report Status PENDING   Incomplete    Studies/Results: Dg Chest 2 View  10/05/2012   *RADIOLOGY REPORT*  Clinical Data: Chest pain, shortness of breath, sickle cell crisis  CHEST - 2 VIEW  Comparison: 08/27/2012  Findings: Lung volumes are low with crowding of the bronchovascular markings.   Heart size mildly enlarged with central vascular congestion.  No focal pulmonary opacity.  No pleural effusion.  No acute osseous finding.  No significant change in fish mouth deformity of vertebral  bodies typical for sickle cell disease. Cholecystectomy clips noted.  IMPRESSION: Low lung volumes with mild cardiomegaly and central vascular congestion.   Original Report Authenticated By: Christiana Pellant, M.D.   Ir Fluoro Guide Cv Line Right  10/10/2012   *RADIOLOGY REPORT*  Clinical Data: Sickle cell anemia with crisis, poor venous access; request is made for central venous access for fluids and medications.  PICC LINE PLACEMENT WITH ULTRASOUND AND FLUOROSCOPIC  GUIDANCE  Fluoroscopy Time: 0.2 minutes  The right arm was prepped with chlorhexidine, draped in the usual sterile fashion using maximum barrier technique (cap and mask, sterile gown, sterile gloves, large sterile sheet, hand hygiene and cutaneous antisepsis) and infiltrated locally with 1% Lidocaine.  Ultrasound demonstrated patency of the right basilic vein, and this was documented with an image.  Under real-time ultrasound guidance, this vein was accessed with a 21 gauge micropuncture needle and image documentation was performed.  The needle was exchanged over a guidewire for a peel-away sheath through which a five [place] double lumen PICC trimmed to 37 cm was advanced, positioned with its tip at the lower SVC/right atrial junction.  Fluoroscopy during the procedure and fluoro spot radiograph confirms appropriate catheter position.  The catheter was flushed, secured to the skin with Prolene sutures, and covered with a sterile dressing.  Complications:  None  IMPRESSION: Successful right arm PICC line placement with ultrasound and fluoroscopic guidance.  The catheter is ready for use.  Read by: Jeananne Rama, P.A.-C  IR RIGHT FLOURO GUIDE CV LINE  Fluoroscopy Time: dictate in minutes & seconds  Comparison: None.  Findings:  IMPRESSION:   Original Report  Authenticated By: D. Andria Rhein, MD   Medications: Scheduled Meds: . feeding supplement  237 mL Oral BID BM  . folic acid  1 mg Oral Daily  . guaiFENesin  600 mg Oral BID  .  HYDROmorphone (DILAUDID) injection  2 mg Intramuscular Once  . hydroxyurea  1,000 mg Oral QHS  . oxyCODONE  20 mg Oral Q4H  . polyethylene glycol  17 g Oral Daily  . senna-docusate  1 tablet Oral BID   Continuous Infusions: . dextrose 5 % and 0.45% NaCl 20 mL/hr at 10/11/12 1158   PRN Meds:.acetaminophen, acetaminophen, alum & mag hydroxide-simeth, HYDROmorphone (DILAUDID) injection, menthol-cetylpyridinium, ondansetron (ZOFRAN) IV, ondansetron, zolpidem   Nikkol Pai 10/12/2012, 8:21 AM

## 2012-10-12 NOTE — Progress Notes (Signed)
D/c instructions given and also her prescriptions,patient verbalized understanding.-Jon Kasparek RN

## 2012-10-12 NOTE — Discharge Summary (Addendum)
DISCHARGE SUMMARY  [name]  MR#: [id number]  DOB:01-28-1991  Date of Admission: 10/05/2012 Date of Discharge: 10/12/2012  Attending Physician:[name]  Patient's BJY:NWGNFAOZ,[name]  Consults: None  Discharge Diagnoses: Present on Admission:  . (Resolved) SICKLE CELL CRISIS . Anemia . Leukocytosis, unspecified . Fever, unspecified  Patient admitted with acute VOC, she was placed on IV dilaudid. There was an issues with access, she initially received IM dilaudid. PICC line was eventually placed. Currently patient pain level remains elevated at 6-7/10. Patient however wants to go home. Patient has a baseline leukocytosis of approximately 14, here her WBC is more elevated at 20, there is no evidence of infection, UA,CXR are negative, no clinical evidence of infection. No antibiotics given. Patient will be discharged as requested.    Medication List    TAKE these medications       folic acid 1 MG tablet  Commonly known as:  FOLVITE  Take 1 tablet (1 mg total) by mouth daily.     hydroxyurea 500 MG capsule  Commonly known as:  HYDREA  Take 2 capsules (1,000 mg total) by mouth daily. May take with food to minimize GI side effects.     hydroxyurea 500 MG capsule  Commonly known as:  HYDREA  Take 2 capsules (1,000 mg total) by mouth at bedtime. May take with food to minimize GI side effects.     ibuprofen 600 MG tablet  Commonly known as:  ADVIL,MOTRIN  Take 1 tablet (600 mg total) by mouth every 6 (six) hours as needed for pain.     lactobacillus acidophilus Tabs  Take 2 tablets by mouth 3 (three) times daily.     morphine 15 MG tablet  Commonly known as:  MSIR  Take 1 tablet (15 mg total) by mouth every 6 (six) hours as needed.     oxyCODONE 5 MG immediate release tablet  Commonly known as:  Oxy IR/ROXICODONE  Take 1 tablet (5 mg total) by mouth every 6 (six) hours as needed for pain.     oxyCODONE 15 MG immediate release tablet  Commonly known  as:  ROXICODONE  Take 1 tablet (15 mg total) by mouth every 6 (six) hours as needed. For pain.     oxyCODONE 5 MG immediate release tablet  Commonly known as:  Oxy IR/ROXICODONE  Take 1 tablet (5 mg total) by mouth every 6 (six) hours as needed.     polyethylene glycol packet  Commonly known as:  MIRALAX / GLYCOLAX  Take 17 g by mouth daily.     senna 8.6 MG Tabs  Commonly known as:  SENOKOT  Take 1 tablet (8.6 mg total) by mouth 2 (two) times daily.          Hospital Course: Present on Admission:  . (Resolved) SICKLE CELL CRISIS . Anemia . Leukocytosis, unspecified . Fever, unspecified:  Patient admitted with acute VOC  Results for orders placed during the hospital encounter of 10/05/12 (from the past 24 hour(s))  COMPREHENSIVE METABOLIC PANEL     Status: Abnormal   Collection Time    10/11/12  9:05 AM      Result Value Range   Sodium 137  135 - 145 mEq/L   Potassium 3.9  3.5 - 5.1 mEq/L   Chloride 102  96 - 112 mEq/L   CO2 28  19 - 32 mEq/L   Glucose, Bld 103 (*) 70 - 99 mg/dL   BUN 7  6 - 23 mg/dL  Creatinine, Ser 0.61  0.50 - 1.10 mg/dL   Calcium 8.8  8.4 - 40.9 mg/dL   Total Protein 6.9  6.0 - 8.3 g/dL   Albumin 3.3 (*) 3.5 - 5.2 g/dL   AST 24  0 - 37 U/L   ALT 28  0 - 35 U/L   Alkaline Phosphatase 74  39 - 117 U/L   Total Bilirubin 2.0 (*) 0.3 - 1.2 mg/dL   GFR calc non Af Amer >90  >90 mL/min   GFR calc Af Amer >90  >90 mL/min  C-REACTIVE PROTEIN     Status: Abnormal   Collection Time    10/11/12 11:15 AM      Result Value Range   CRP 2.2 (*) <0.60 mg/dL  HEMOGLOBIN AND HEMATOCRIT, BLOOD     Status: Abnormal   Collection Time    10/11/12  8:49 PM      Result Value Range   Hemoglobin 8.0 (*) 12.0 - 15.0 g/dL   HCT 81.1 (*) 91.4 - 78.2 %  URINALYSIS, ROUTINE W REFLEX MICROSCOPIC     Status: None   Collection Time    10/12/12  5:16 AM      Result Value Range   Color, Urine YELLOW  YELLOW   APPearance CLEAR  CLEAR   Specific Gravity, Urine 1.012   1.005 - 1.030   pH 6.0  5.0 - 8.0   Glucose, UA NEGATIVE  NEGATIVE mg/dL   Hgb urine dipstick NEGATIVE  NEGATIVE   Bilirubin Urine NEGATIVE  NEGATIVE   Ketones, ur NEGATIVE  NEGATIVE mg/dL   Protein, ur NEGATIVE  NEGATIVE mg/dL   Urobilinogen, UA 1.0  0.0 - 1.0 mg/dL   Nitrite NEGATIVE  NEGATIVE   Leukocytes, UA NEGATIVE  NEGATIVE    Disposition: Home   Follow-up Appts:      Future Appointments Provider Department Dept Phone   10/15/2012 1:00 PM Nancy Harm, [name] [phone]      Follow-up with Dr. Ashley [name], sickle cell clinic in 1 week.   Tests Needing Follow-up: None  Total time: 35 minutes  Signed: Inza [name] 10/12/2012, 9:00 AM

## 2012-10-12 NOTE — Progress Notes (Signed)
Patient d/c home,stable.- Latham Kinzler RN 

## 2012-10-13 IMAGING — CR DG CHEST 2V
2 series · 2 of 2 positions shown · non-contrast
Comparison: 11/21/2011

CLINICAL DATA: Cough, shortness of breath

CHEST - 2 VIEW

[w chest pa]
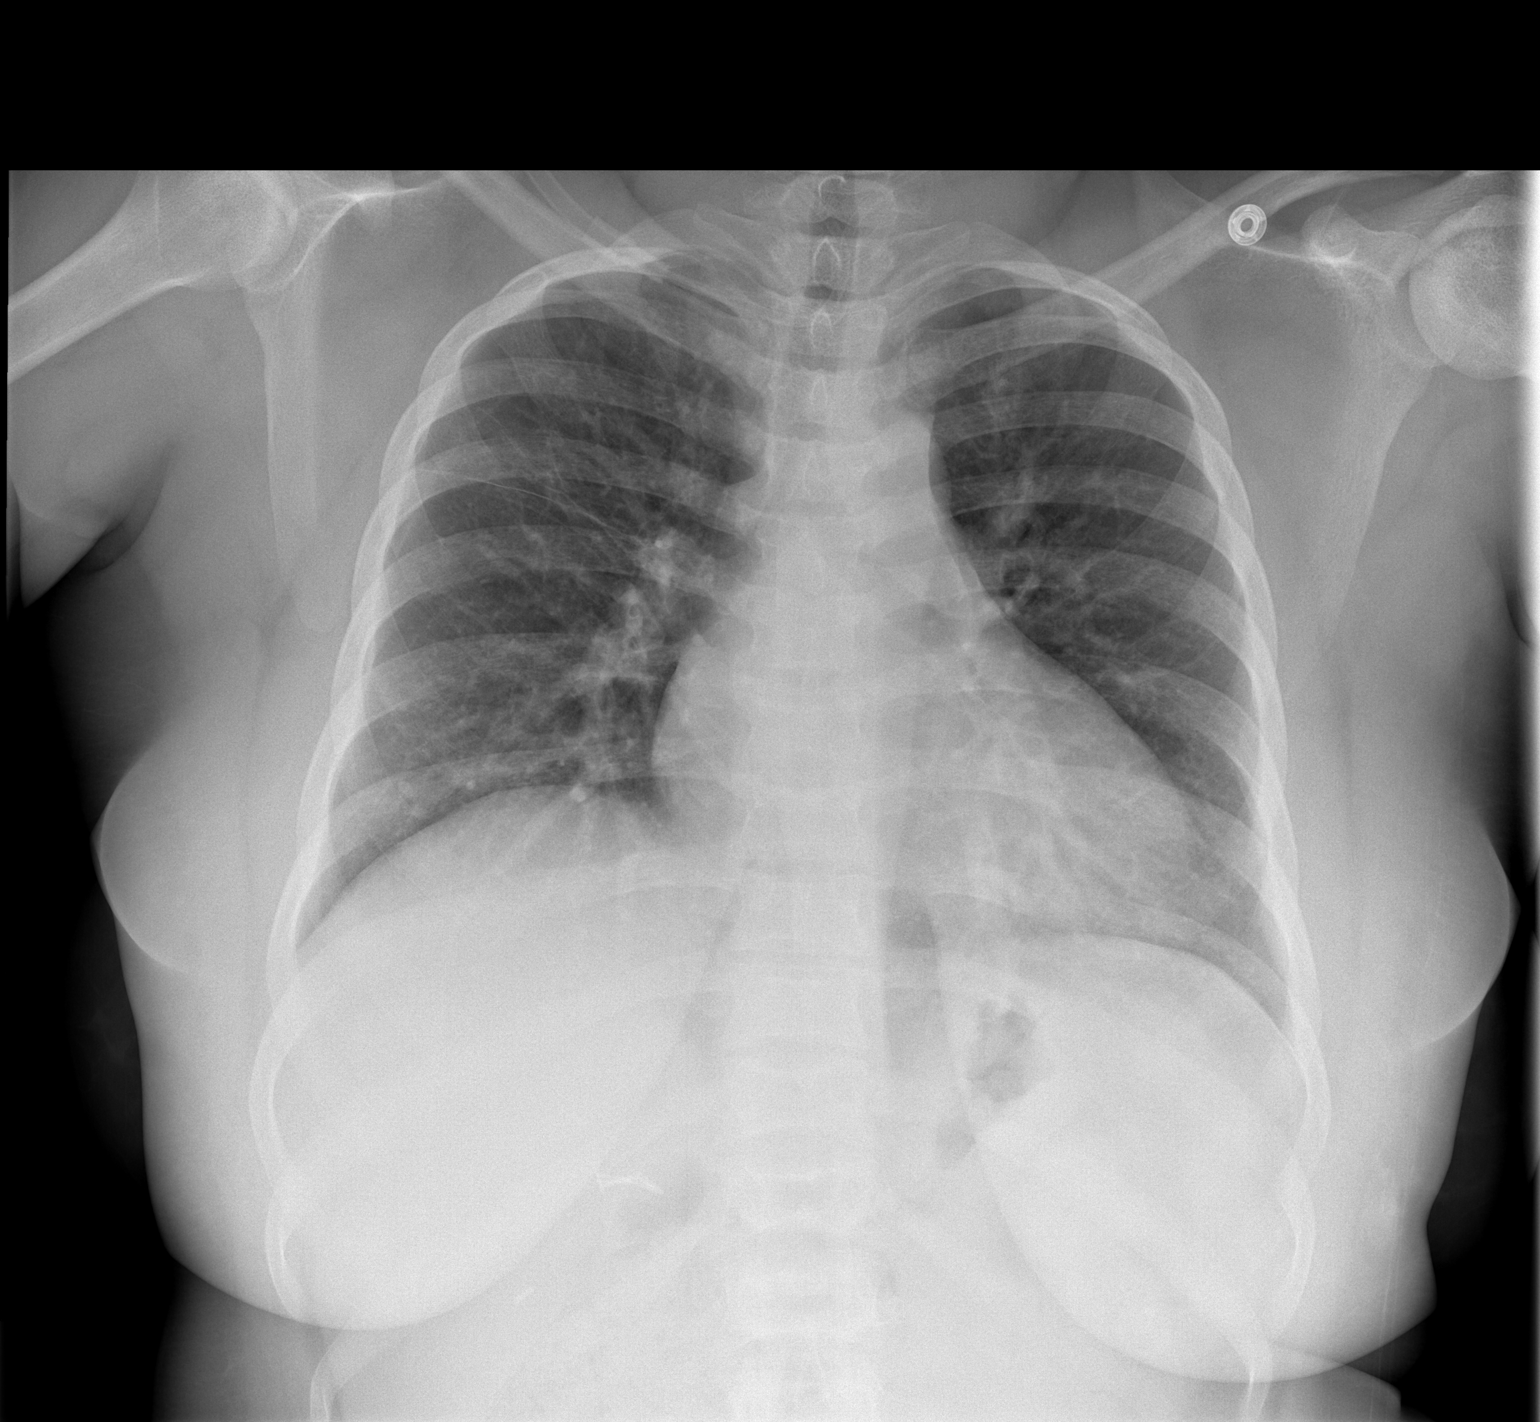

[w chest lat]
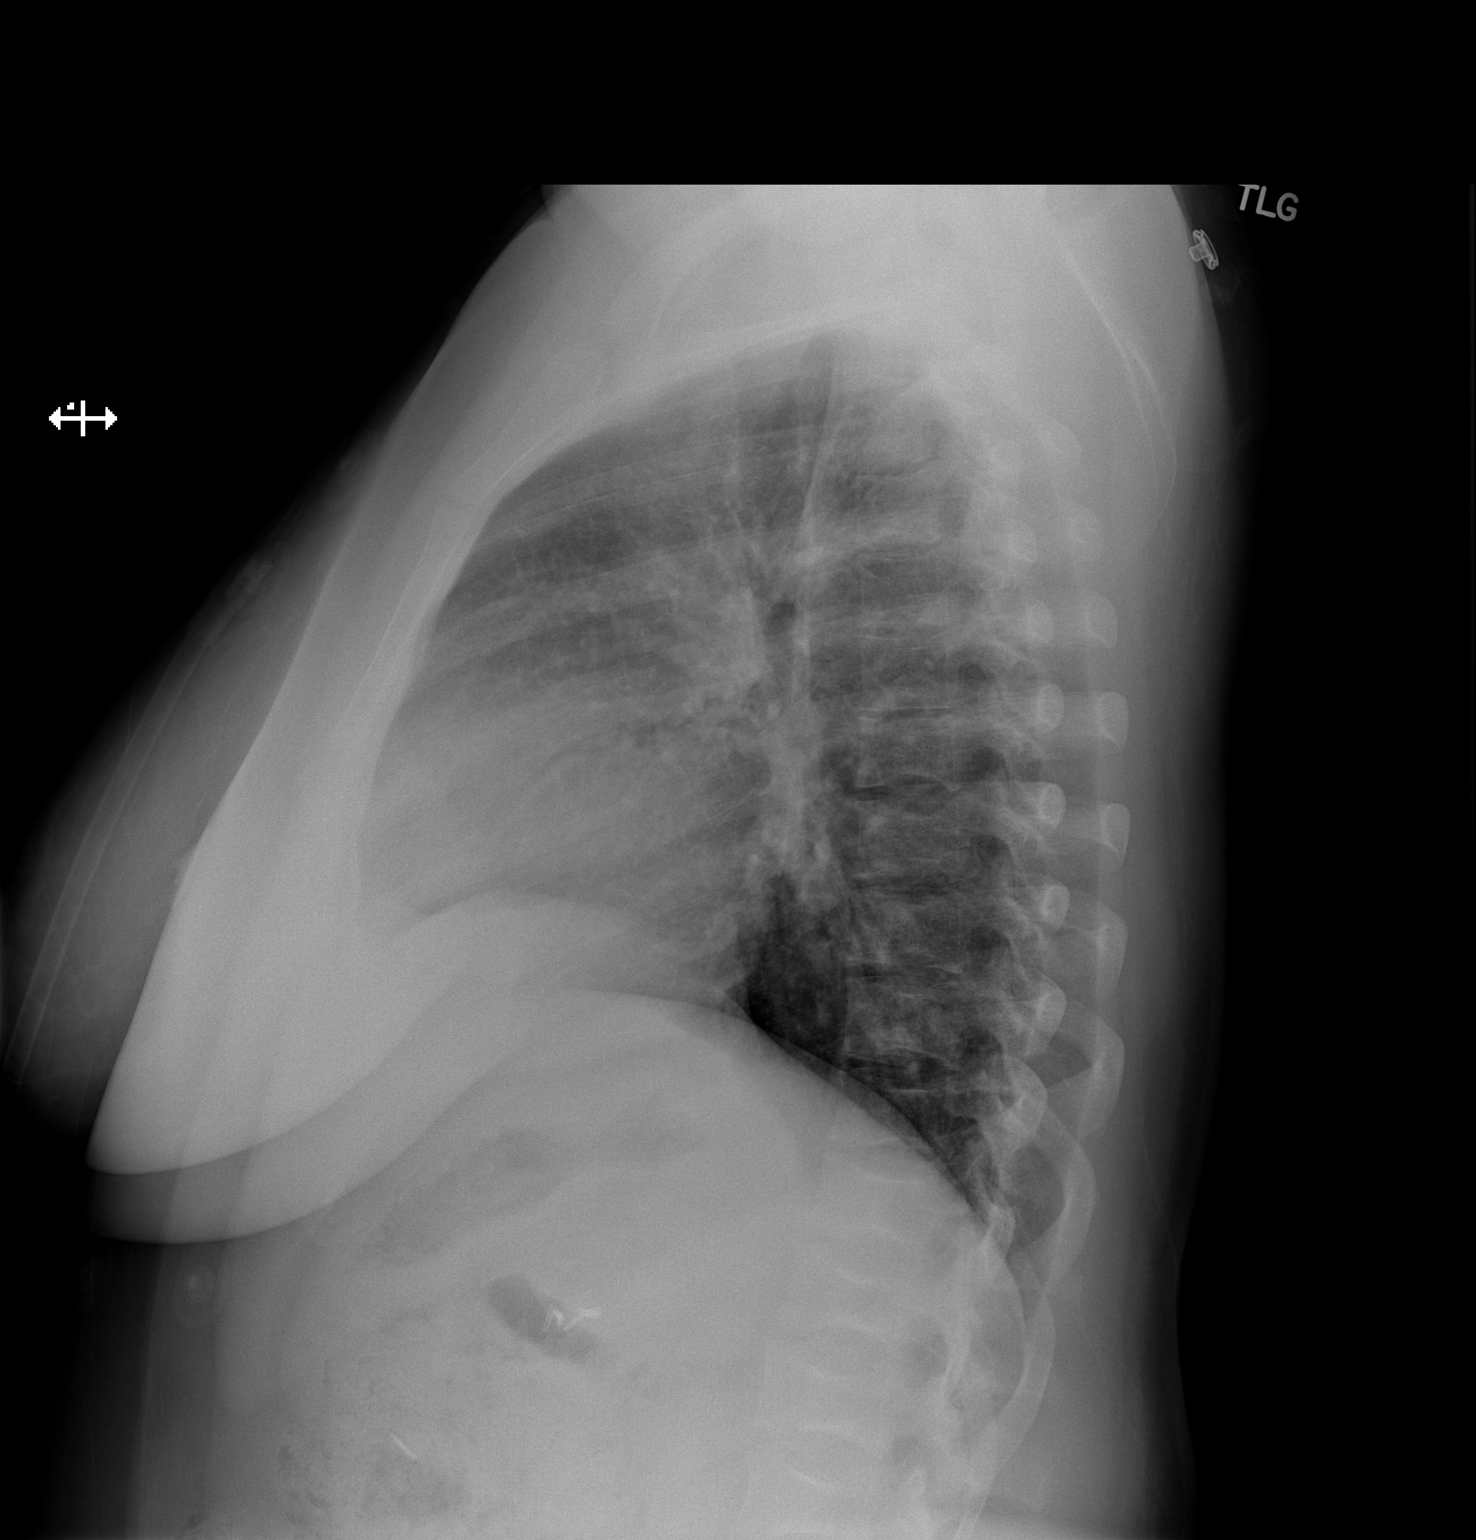

[2 of 2 positions shown; findings below may reference images not displayed]

FINDINGS: Lung volumes are low with crowding of the bronchovascular
markings.  Heart size upper limits of normal.  Mild central
bronchial wall thickening noted.  Biconcave vertebral bodies have
appearance typical for sickle cell disease.  No pleural effusion.
No acute osseous finding. Sclerosis in the humeral heads
bilaterally is also compatible with sickle cell disease.
IMPRESSION: Central bronchial wall thickening which may indicate bronchitis.

Biconcave vertebral body deformities compatible with sickle cell
disease.

## 2012-10-14 IMAGING — CT CT ANGIO CHEST
1 of 3 series · 19 of 32 positions shown · IV contrast (APPLIED)
Comparison: None.

CLINICAL DATA: Back pain.

CT ANGIOGRAPHY CHEST
TECHNIQUE: Multidetector CT imaging of the chest using the
standard protocol during bolus administration of intravenous
contrast. Multiplanar reconstructed images including MIPs were
obtained and reviewed to evaluate the vascular anatomy.
Contrast: 200mL OMNIPAQUE IOHEXOL 350 MG/ML SOLN

[Series 7: pulm embolism 1.0 b25f st · axial · 0.63mm/px · z∈[-276,-70]mm · 19 of 230 slices shown]
[im 12/230  lung]
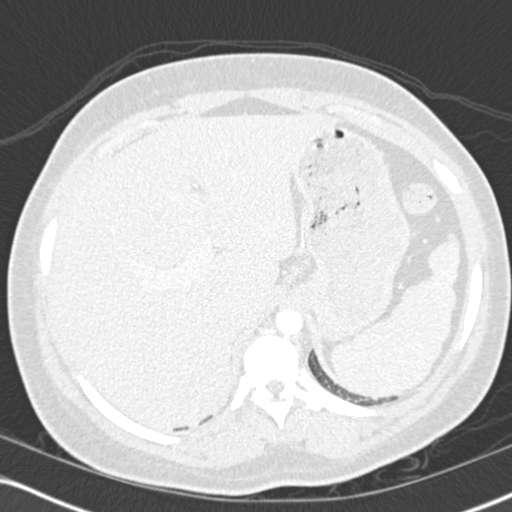
[im 23/230  mediastinal]
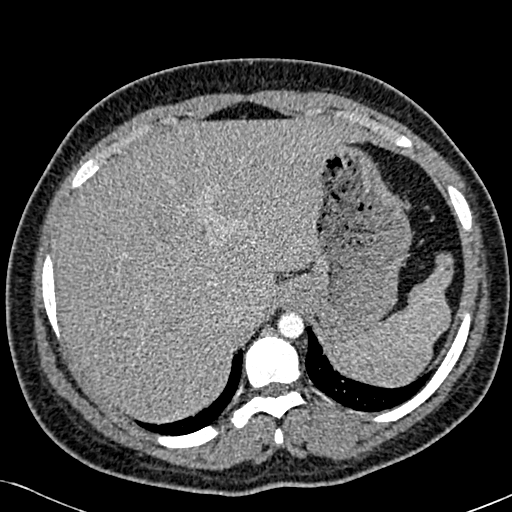
[im 35/230  lung]
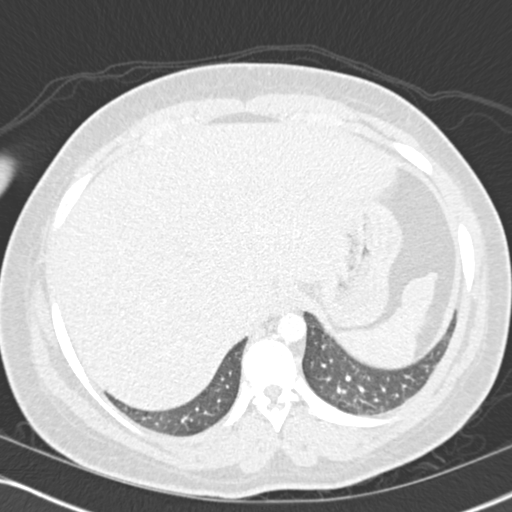
[im 58/230  mediastinal]
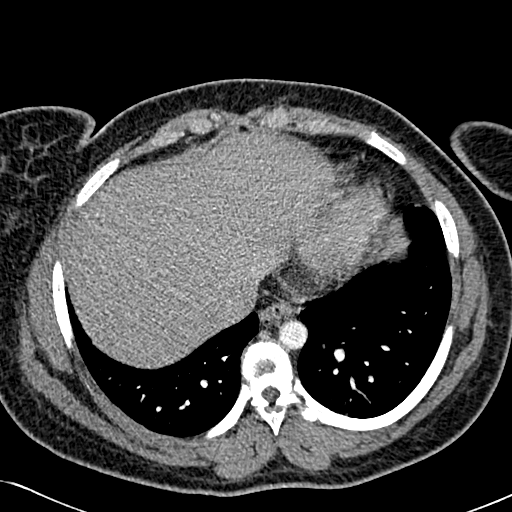
[im 69/230  lung]
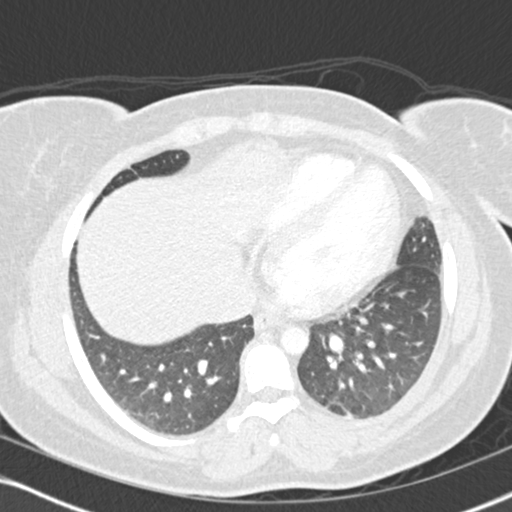
[im 77/230  mediastinal]
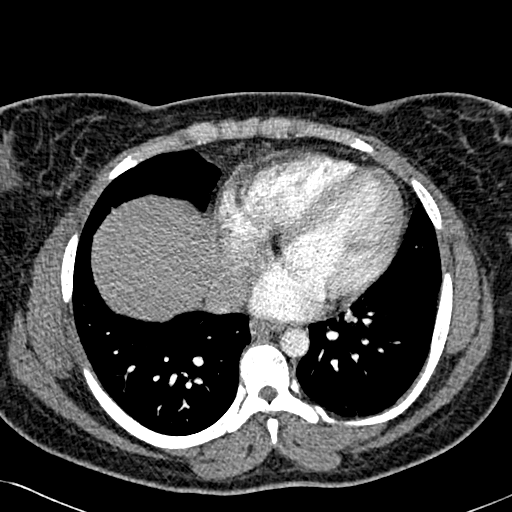
[im 81/230  lung]
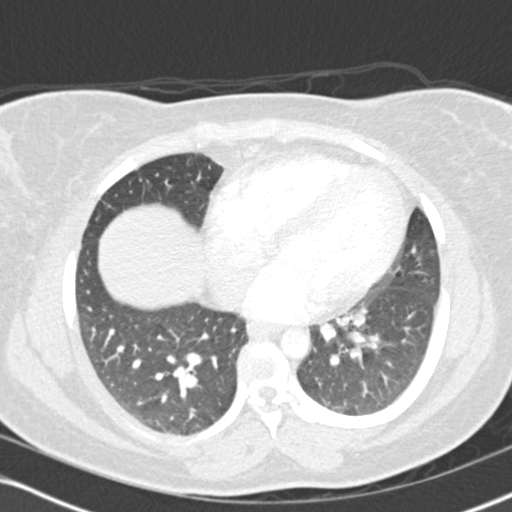
[im 92/230  mediastinal]
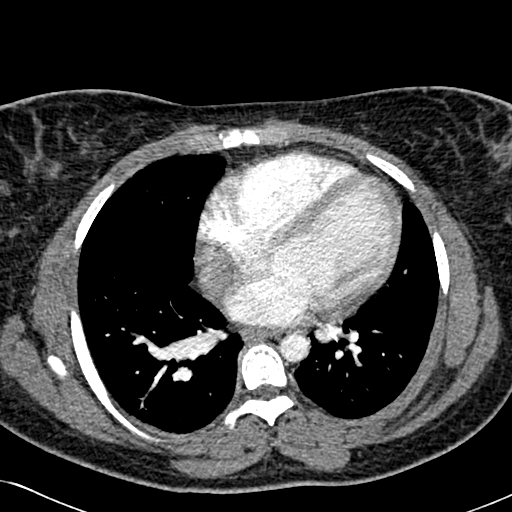
[im 104/230  lung]
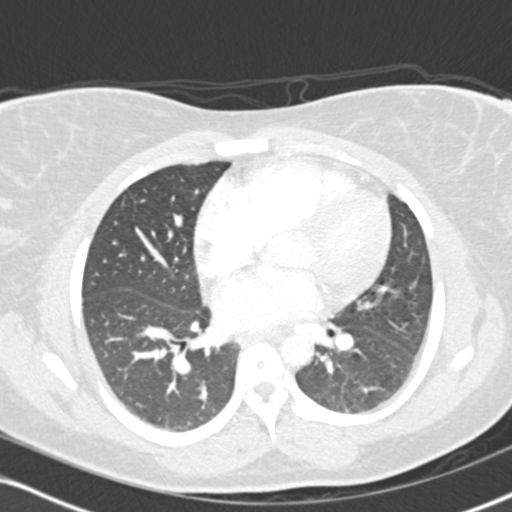
[im 115/230  mediastinal]
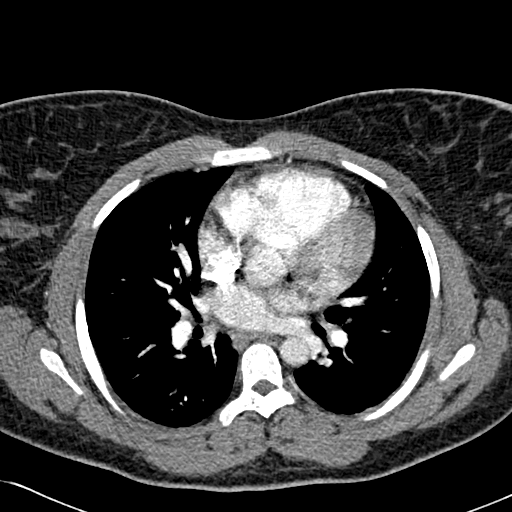
[im 126/230  lung]
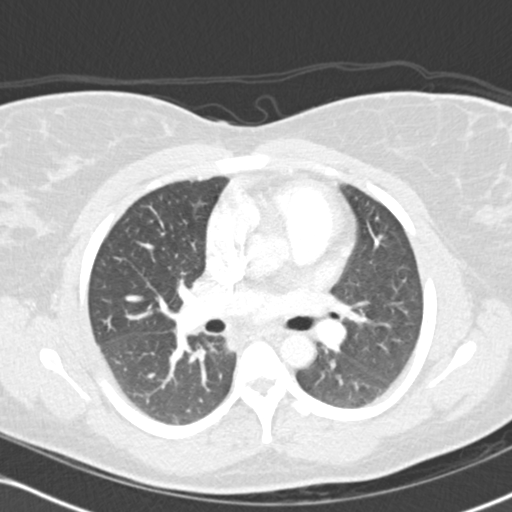
[im 138/230  mediastinal]
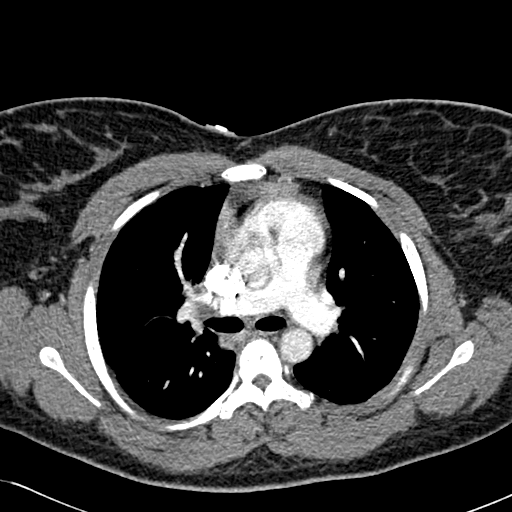
[im 149/230  lung]
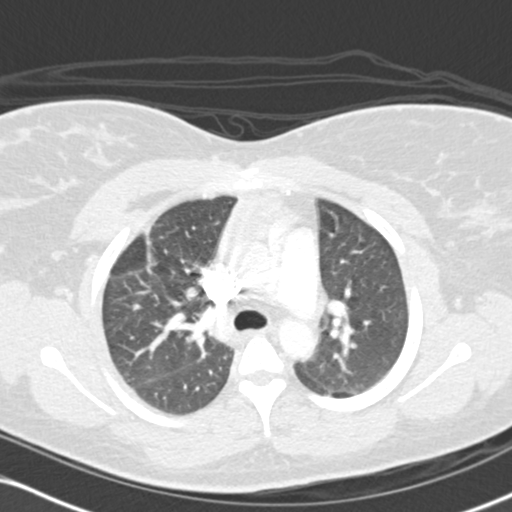
[im 153/230  mediastinal]
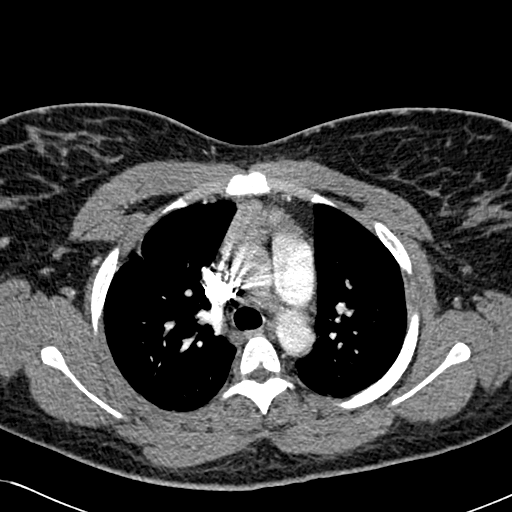
[im 161/230  lung]
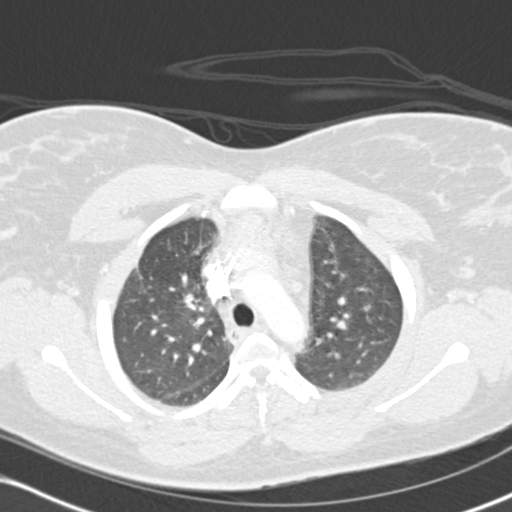
[im 172/230  mediastinal]
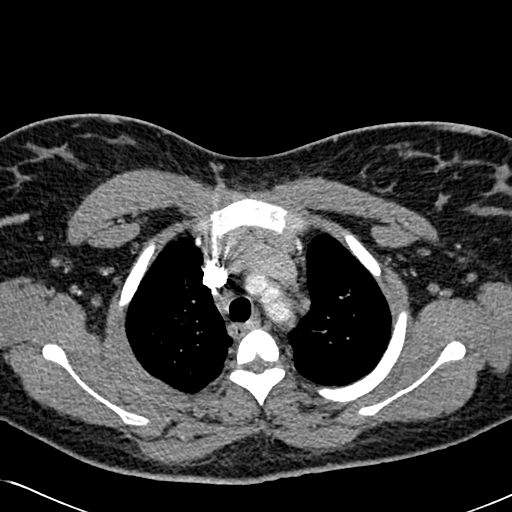
[im 195/230  lung]
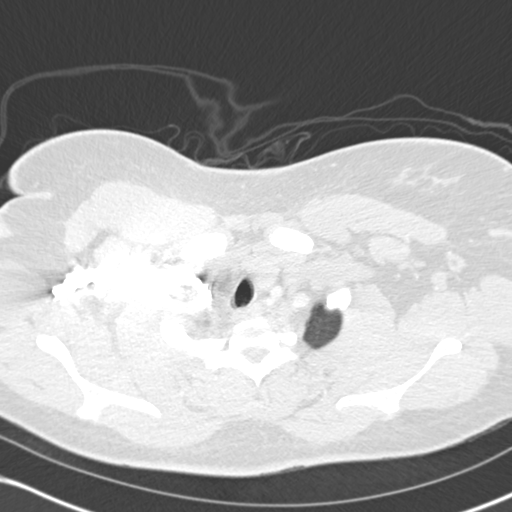
[im 207/230  mediastinal]
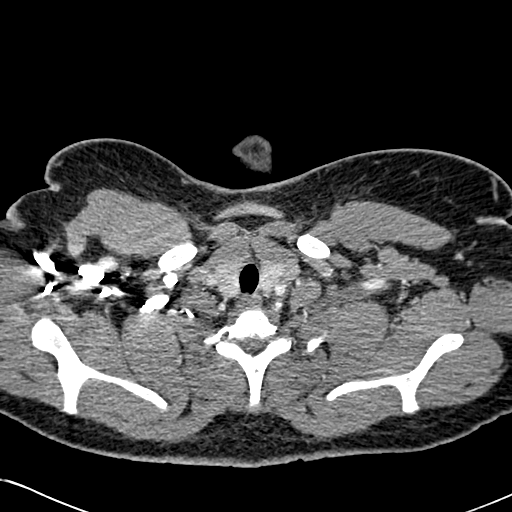
[im 218/230  lung]
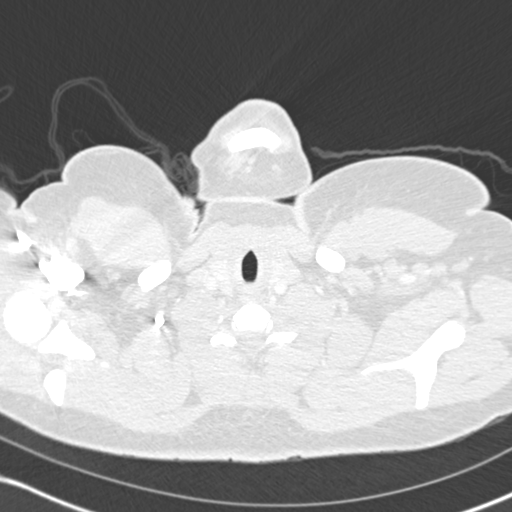

[19 of 32 positions shown; findings below may reference images not displayed]

FINDINGS: Initial injection resulted in poor contrast bolus
requiring re-injection for diagnostic quality images.  Subsequent
images demonstrate adequate study technically with moderately good
opacification of the central and segmental pulmonary arteries.  No
focal filling defects are identified.  No evidence of significant
pulmonary embolus.

Normal heart size.  Normal caliber thoracic aorta.  Visualized
portions of the upper abdominal organs are unremarkable.  The
esophagus is decompressed.  No significant lymphadenopathy in the
chest.  No pleural effusion.

Respiratory motion artifact and dependent atelectatic changes in
the lungs.  No focal airspace consolidation or interstitial
changes.  No pneumothorax.  Vague indeterminate nodule in the left
lower lung measures about 4 mm diameter and is likely to be benign.
Normal alignment of the thoracic vertebrae.  Biconcave appearance
of the thoracic vertebra with increased density suggesting sickle
cell changes.
IMPRESSION: No evidence of significant pulmonary embolus.

## 2012-10-15 ENCOUNTER — Encounter: Payer: Self-pay | Admitting: Internal Medicine

## 2012-10-15 ENCOUNTER — Ambulatory Visit (INDEPENDENT_AMBULATORY_CARE_PROVIDER_SITE_OTHER): Payer: Medicaid Other | Admitting: Internal Medicine

## 2012-10-15 VITALS — BP 124/77 | HR 87 | Temp 98.4°F | Ht 62.0 in | Wt 190.0 lb

## 2012-10-15 DIAGNOSIS — F329 Major depressive disorder, single episode, unspecified: Secondary | ICD-10-CM

## 2012-10-15 DIAGNOSIS — F32A Depression, unspecified: Secondary | ICD-10-CM

## 2012-10-15 DIAGNOSIS — D5700 Hb-SS disease with crisis, unspecified: Secondary | ICD-10-CM

## 2012-10-15 MED ORDER — SERTRALINE HCL 50 MG PO TABS: 50.0000 mg | ORAL_TABLET | Freq: Every day | ORAL | Status: DC

## 2012-10-15 MED ORDER — FOLIC ACID 800 MCG PO TABS: 1600.0000 ug | ORAL_TABLET | Freq: Every day | ORAL | Status: DC

## 2012-10-15 NOTE — Progress Notes (Signed)
Subjective:    Patient ID: [name], female    DOB: 11/01/1990, 22 y.o.   MRN: [id number]  HPI: Patient presents to the hospital today for post hospital followup visit and also with complaints of depression.  The patient states that since discharge from the hospital she's been having pain that does not feel like her sickle cell pain but rather feels like muscle pain in the area between her shoulder blades as well as at the inferior angle of the right scapula. She describes the pain as a tightness in her muscles and states that it improves with massage. She gets a pain intensity of 2-3/10 and denies any associated symptoms. She cannot provide any palliative or provocative features. We discussed the use of antispasmodics and the patient states that she tried Flexeril without any relief from this pain. She also states that she's been having some pain in her joints which are not typical of her pain of sickle cell. The patient admits that she has not been taking her folic acid until we had a discussion during her last hospitalization about the importance of folic acid and Hydrea in preventing complications of his sickle cell disease. The patient offers that she does have folic acid 800 mcg at home expiration date in 2015.  The patient also questioned whether or not she was bipolar. She states that she has periods of depression. PHQ- 9 showed a score of 7. She endorses features of depression but has no manic symptoms associated. The patient does admit that she has difficulty managing her anger at controlling her emotions. States that she had been prescribed antidepressants in the past during her teenage years but did not take it as prescribed. She is requesting a referral for psychotherapy.  She denies any dysuria, fever, chills, nausea, vomiting or diarrhea    Review of Systems  Constitutional: Negative.   HENT: Negative.   Eyes: Negative.   Respiratory: Negative.   Cardiovascular: Negative.    Gastrointestinal: Negative.   Endocrine: Negative.   Genitourinary: Negative.   Musculoskeletal: Positive for myalgias (In the intrascapular region at the inferior angle of the right scapula).  Skin: Negative.   Allergic/Immunologic: Negative.   Neurological: Negative.   Hematological: Negative.   Psychiatric/Behavioral: Positive for sleep disturbance and dysphoric mood.       Objective:   Physical Exam  Constitutional: She is oriented to person, place, and time. She appears well-developed and well-nourished. No distress.  HENT:  Head: Atraumatic.  Right Ear: External ear normal.  Left Ear: External ear normal.  Nose: Nose normal.  Mouth/Throat: Oropharynx is clear and moist.  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  Eyes: Conjunctivae and EOM are normal. Pupils are equal, round, and reactive to light. Right eye exhibits no discharge. Left eye exhibits no discharge. No scleral icterus.  Neck: Normal range of motion. Neck supple.  Cardiovascular: Normal rate, regular rhythm and normal heart sounds.  Exam reveals no gallop and no friction rub.   No murmur heard. Pulmonary/Chest: Effort normal and breath sounds normal. She has no wheezes. She has no rales.  Abdominal: Soft. Bowel sounds are normal. She exhibits no distension and no mass. There is no tenderness.  Musculoskeletal: Normal range of motion.  Neurological: She is alert and oriented to person, place, and time. No cranial nerve deficit.  Skin: Skin is warm.  Psychiatric: She has a normal mood and affect. Her behavior is normal. Judgment and thought content normal.  Assessment & Plan:  1. Hemoglobin SS with crisis: The patient is status post hospitalization and states that her pain is well-controlled with her current medications of 20 mg of Roxicodone every 4 hours as needed for pain. She does have pain which she describes as likely muscle tightness and possible spasm. I recommended the patient  use heat and stretching in combination with massage therapy. The patient states that antispasmodic has been effective and thus will not pursue that avenue. Thus the patient will continue her current dose of medications for symptom management. With regard to disease management the patient had not been compliant with her folic acid and I have prescribed her taking 1600 mcg of folic acid on a daily basis culture she's completed her current supply of medications. Thereafter I will place her 1 mcg daily. With regard to her hydroxyurea she's been on 1000 mcg for greater than one year. During her hospitalization the patient required a transfusion and I will obtain her laboratory studies today. And if her indices allow, I will make a adjustment to her Hydrea to increase it by 5 mg per kilogram per day. I suspect that the decrease hemoglobin is likely contributed to by the lack of folic acid compliance.  2. Depression the patient had a score of 7 on the PhD 9 depression screen. She does have a prior history of depression I will start the patient on Zoloft 50 mg by mouth daily. Pt expressed a concern that she may be bi-polar but has no manic features. I will also refer the patient to psychotherapy as an outpatient.  Total time 30 minutes. Greater than 50% of time spent in discussion about depression and treatment options. Also discussed disease management for sickle cell disease.

## 2012-10-17 ENCOUNTER — Other Ambulatory Visit: Payer: Medicaid Other | Admitting: *Deleted

## 2012-10-17 DIAGNOSIS — D5700 Hb-SS disease with crisis, unspecified: Secondary | ICD-10-CM

## 2012-10-17 LAB — CBC WITH DIFFERENTIAL/PLATELET
Basophils Absolute: 0.1 10*3/uL (ref 0.0–0.1)
Basophils Relative: 1 % (ref 0–1)
Eosinophils Absolute: 0.3 10*3/uL (ref 0.0–0.7)
Eosinophils Relative: 3 % (ref 0–5)
HCT: 28.7 % — ABNORMAL LOW (ref 36.0–46.0)
Hemoglobin: 9.6 g/dL — ABNORMAL LOW (ref 12.0–15.0)
MCH: 30.3 pg (ref 26.0–34.0)
MCHC: 33.4 g/dL (ref 30.0–36.0)
MCV: 90.5 fL (ref 78.0–100.0)
Monocytes Absolute: 1 10*3/uL (ref 0.1–1.0)
Monocytes Relative: 10 % (ref 3–12)
RDW: 20.7 % — ABNORMAL HIGH (ref 11.5–15.5)

## 2012-10-17 LAB — COMPREHENSIVE METABOLIC PANEL
AST: 15 U/L (ref 0–37)
Albumin: 4.1 g/dL (ref 3.5–5.2)
Alkaline Phosphatase: 55 U/L (ref 39–117)
BUN: 6 mg/dL (ref 6–23)
Creat: 0.53 mg/dL (ref 0.50–1.10)
Glucose, Bld: 102 mg/dL — ABNORMAL HIGH (ref 70–99)
Potassium: 4.2 mEq/L (ref 3.5–5.3)

## 2012-10-19 NOTE — Progress Notes (Signed)
Case Management Note: This CM spoke with Layla Maw at [facility] who advised she was unable to reach Ms. Bernabe, [facility] is awaiting a call back from Ms. Przybylski.This Cm will continue to monitor.   Karoline Caldwell RN, BSN, [place] [phone]

## 2012-10-23 ENCOUNTER — Telehealth: Payer: Self-pay | Admitting: Internal Medicine

## 2012-10-25 IMAGING — CR DG WRIST COMPLETE 3+V*R*
4 series · 4 of 4 positions shown · non-contrast
Comparison: Contralateral wrist

CLINICAL DATA: Right wrist pain, sickle cell

RIGHT WRIST - COMPLETE 3+ VIEW

[x wrist pa right]
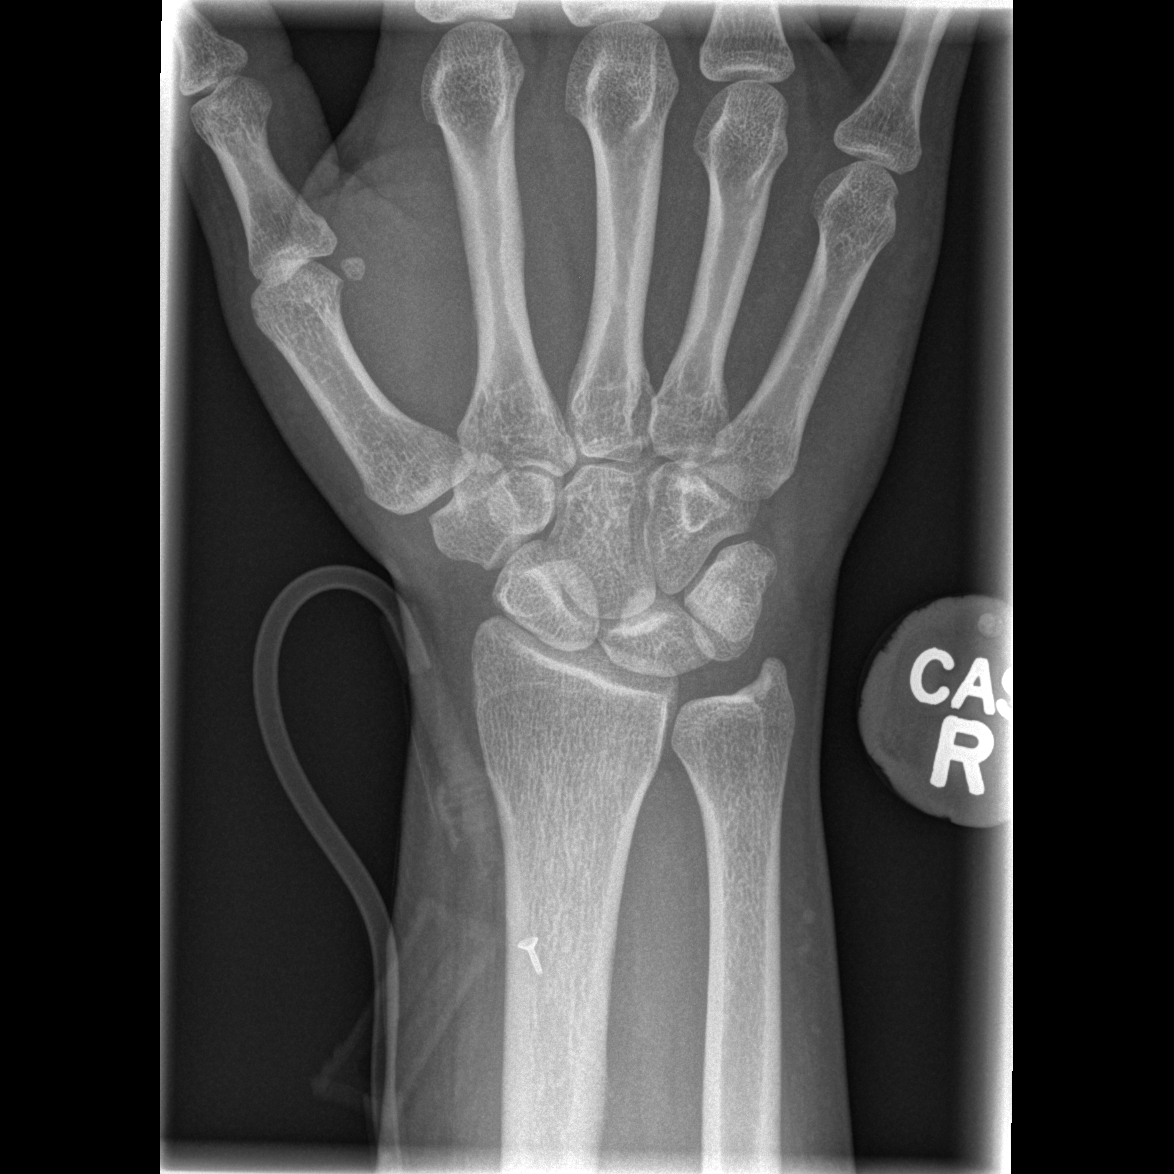

[x wrist obl right]
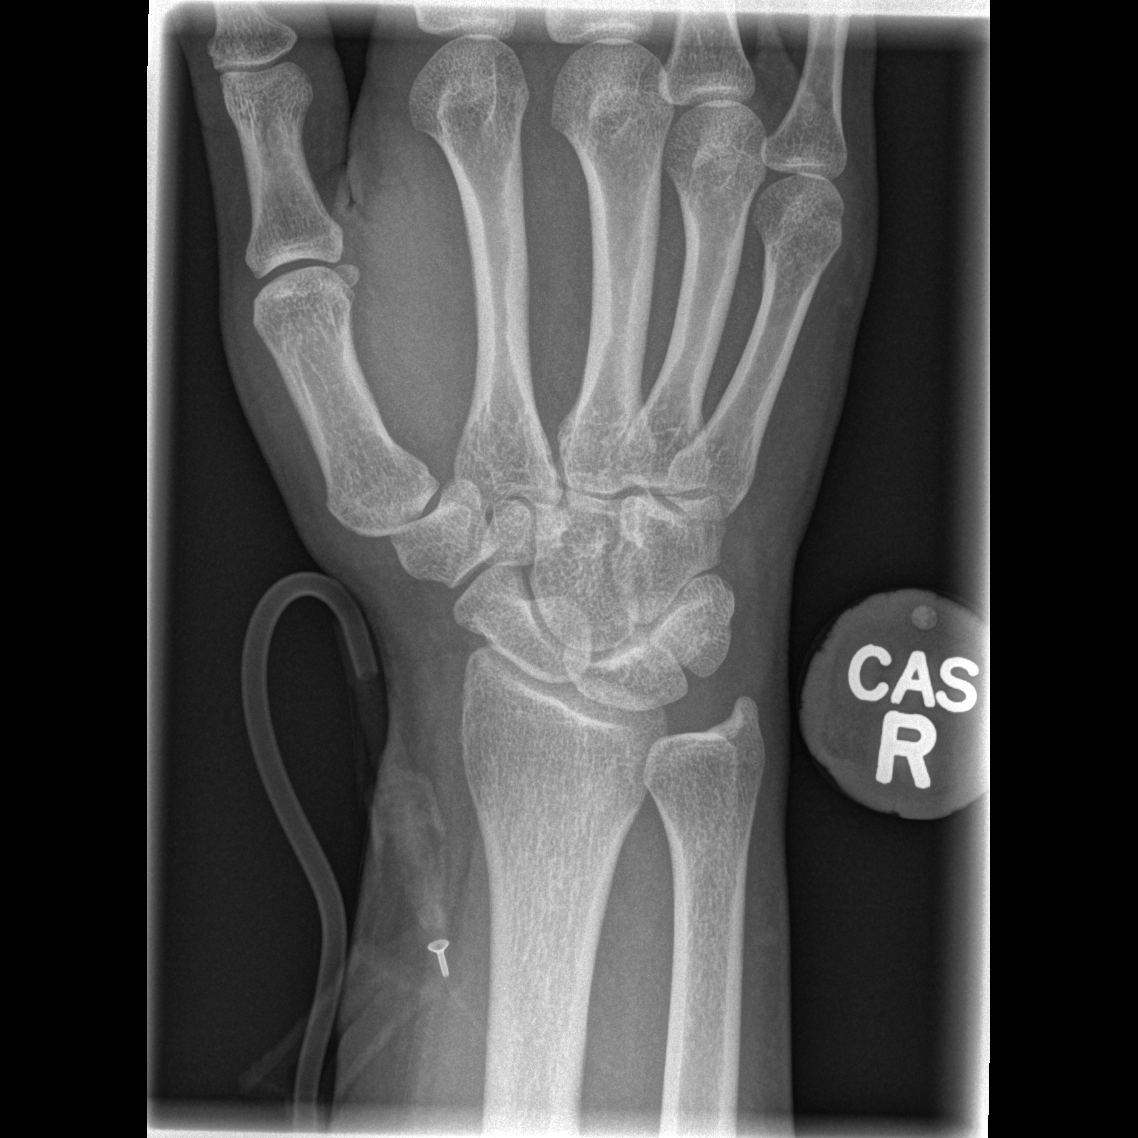

[x wrist lat right]
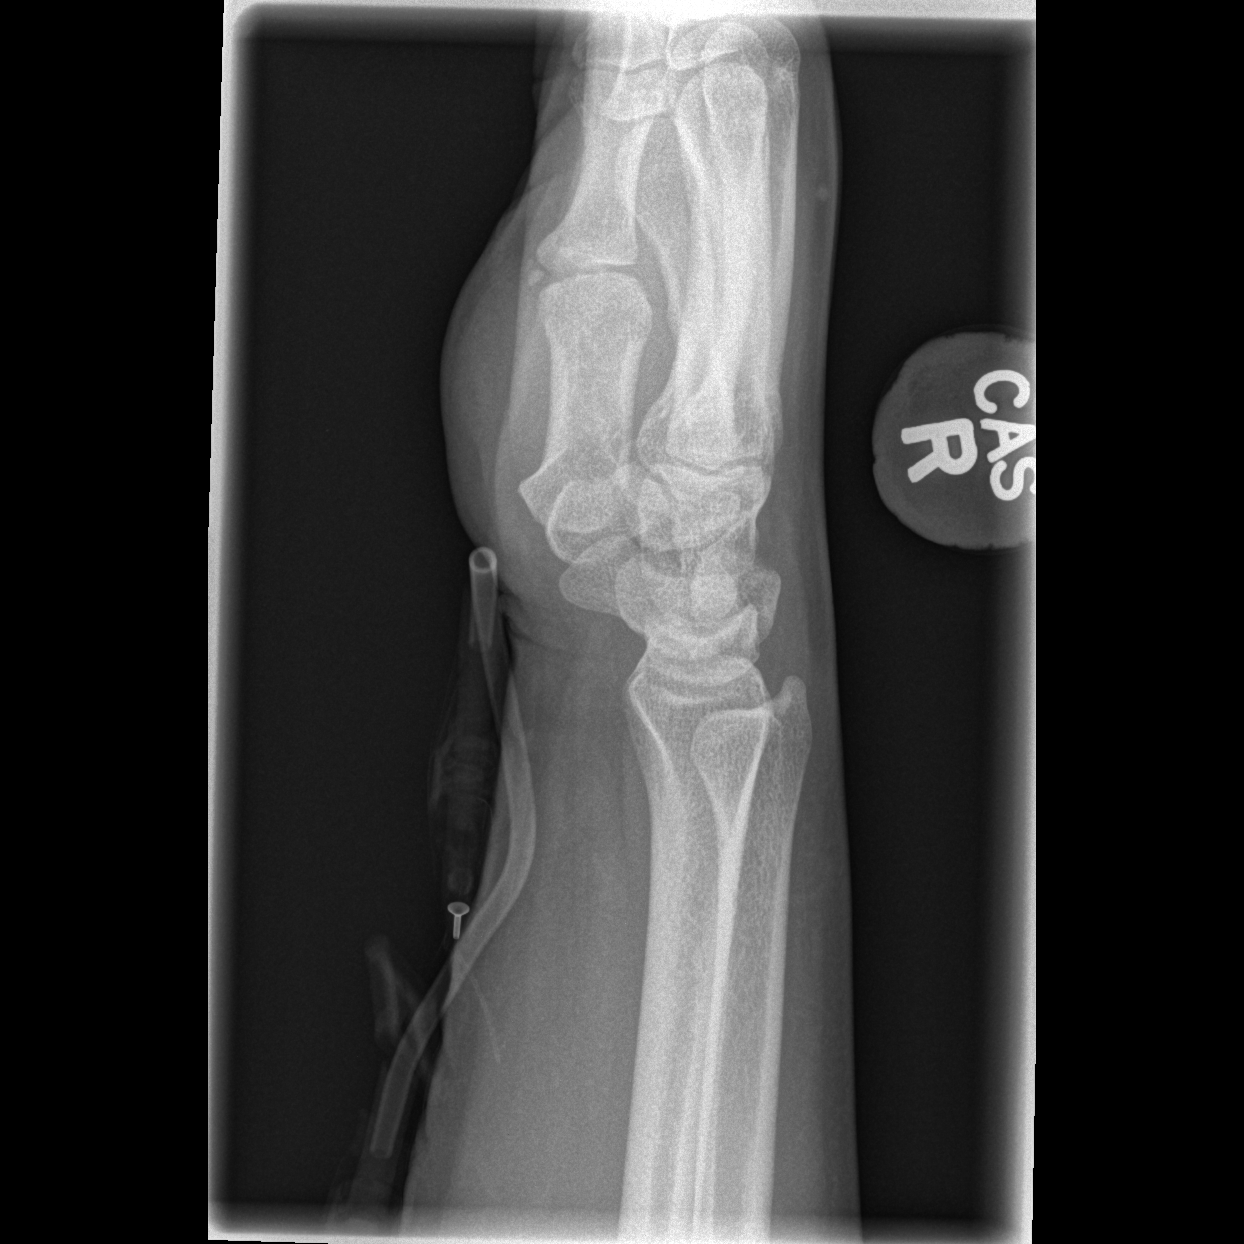

[x wrist navicular]
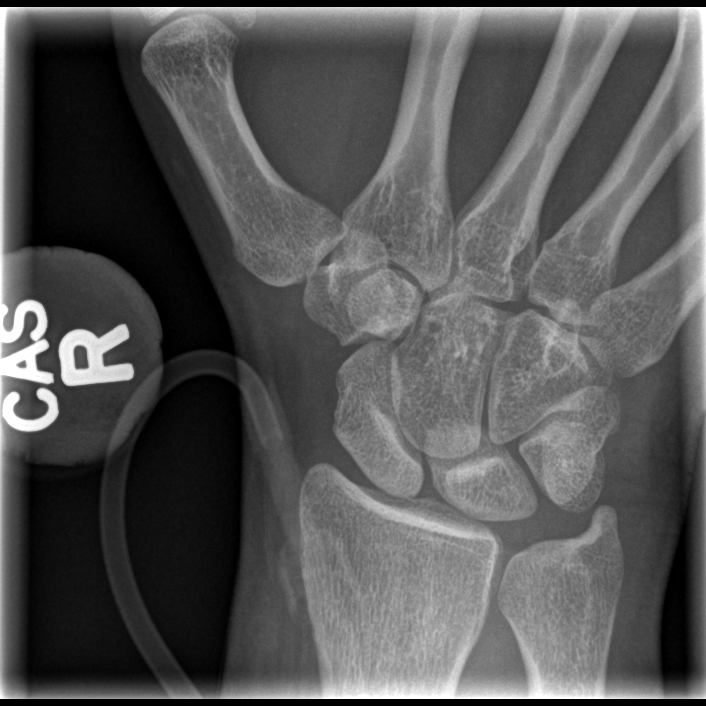

[4 of 4 positions shown; findings below may reference images not displayed]

FINDINGS: No displaced fracture or dislocation.  No aggressive
osseous lesions.
IMPRESSION: No acute osseous abnormality identified.

## 2012-10-25 IMAGING — CR DG WRIST COMPLETE 3+V*L*
4 series · 4 of 4 positions shown · non-contrast
Comparison: {Contralateral wrist}

CLINICAL DATA: Left wrist pain, sickle cell.

LEFT WRIST - COMPLETE 3+ VIEW

[x wrist pa left]
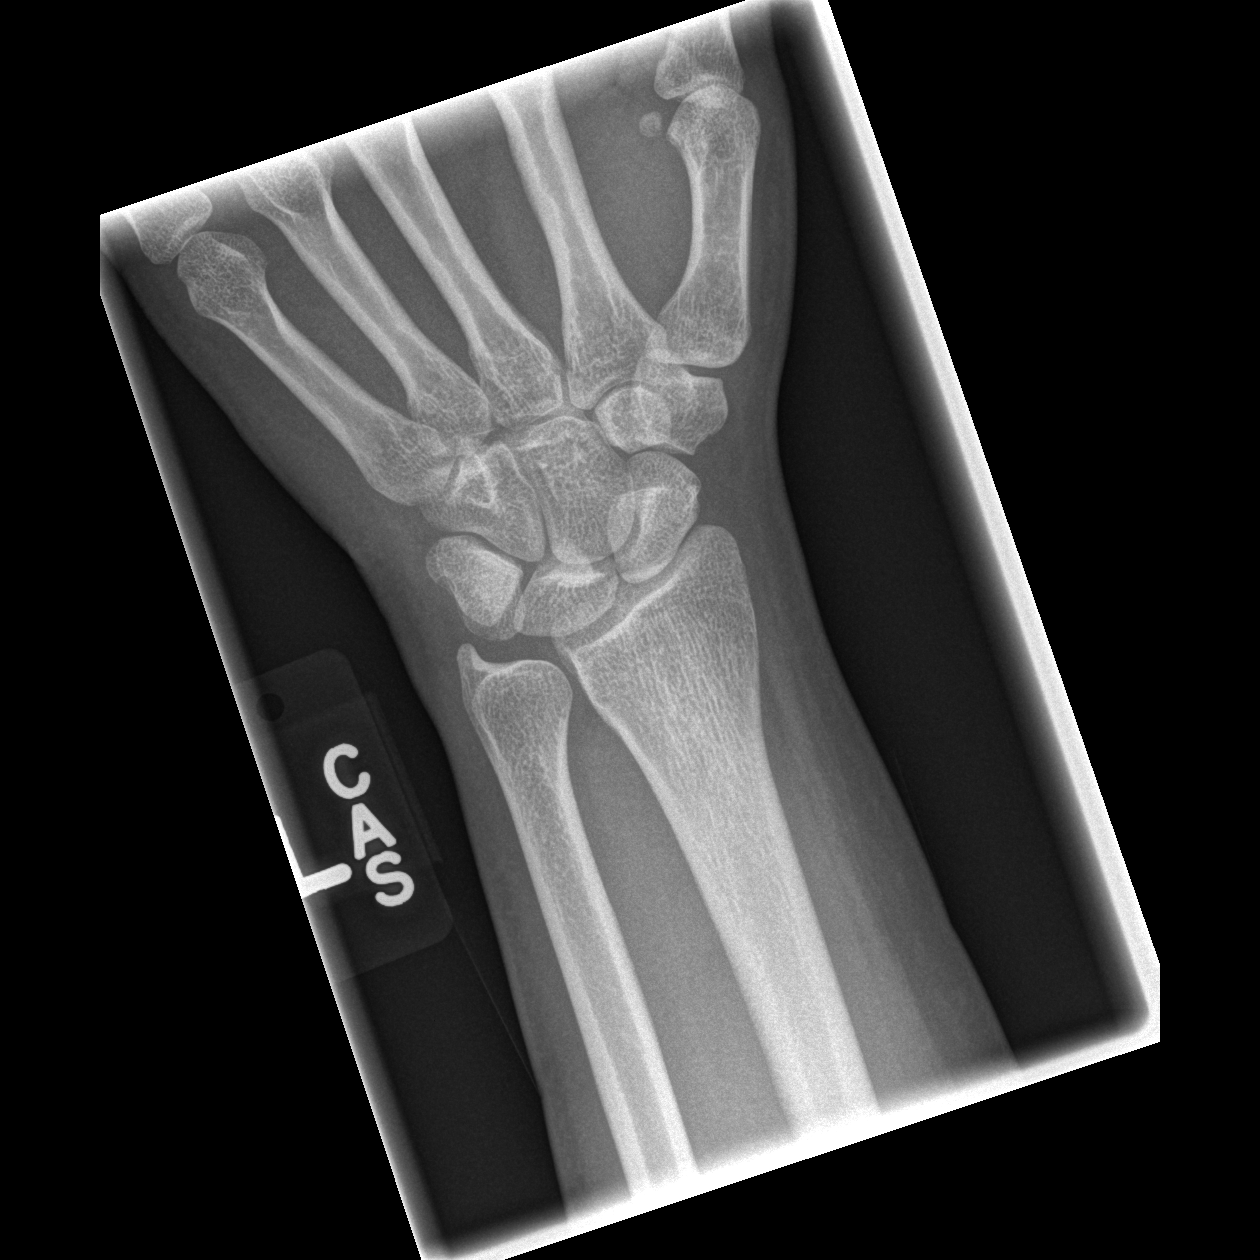

[x wrist obl left]
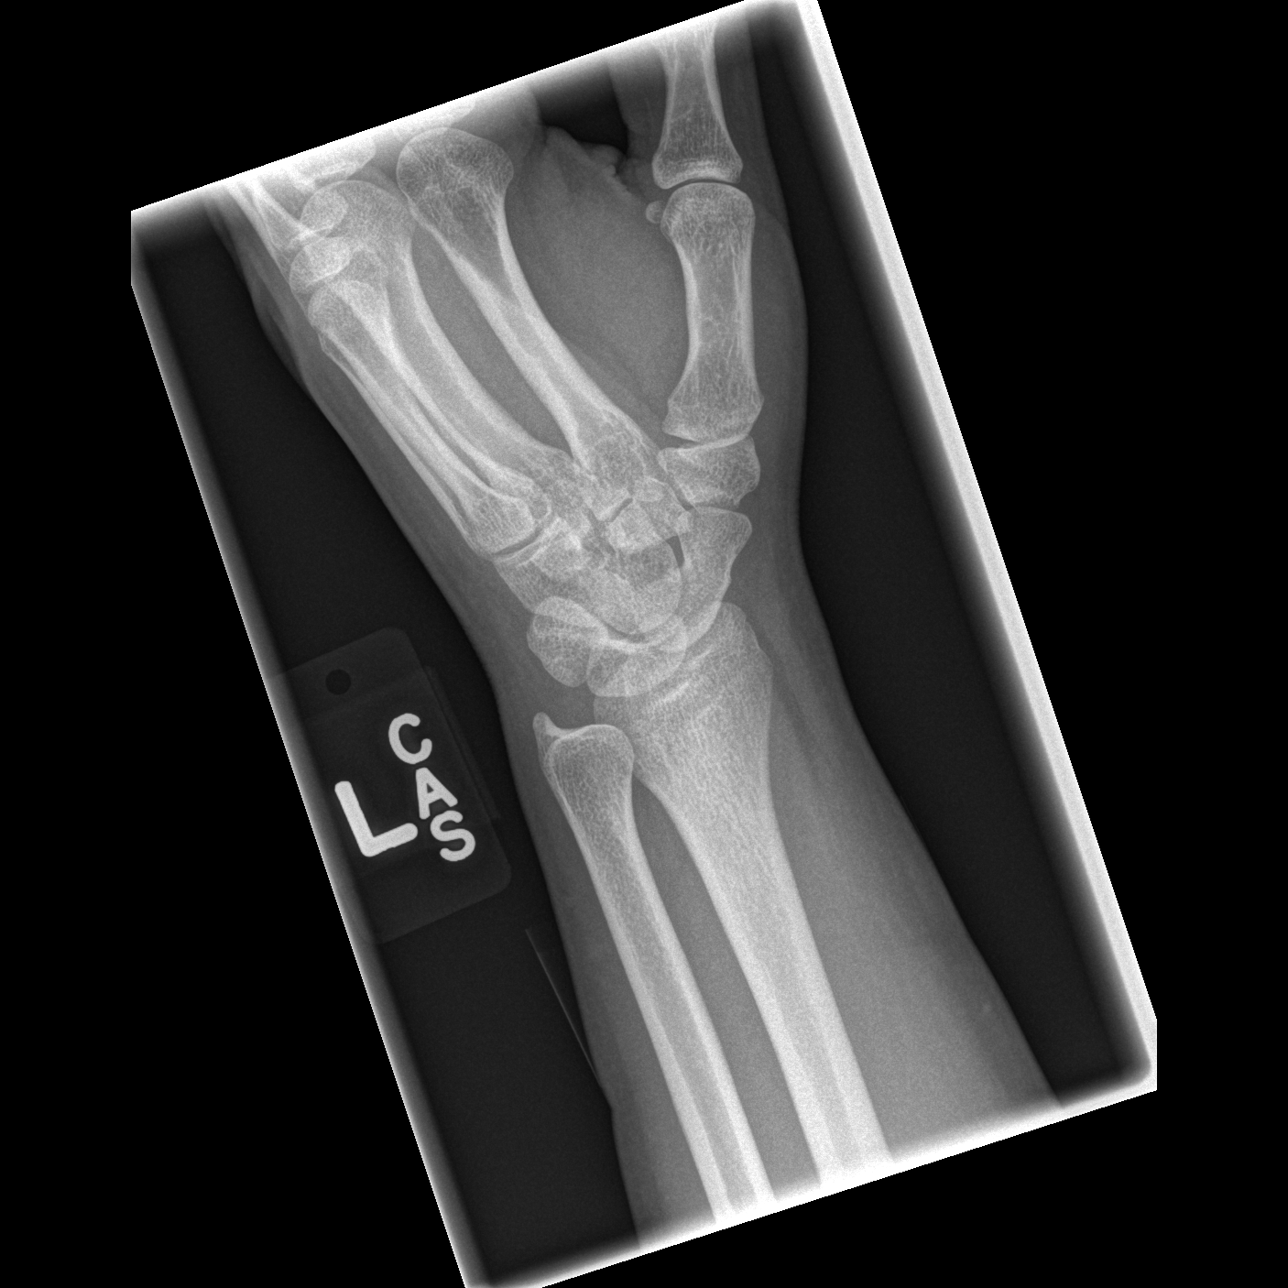

[x wrist lat left]
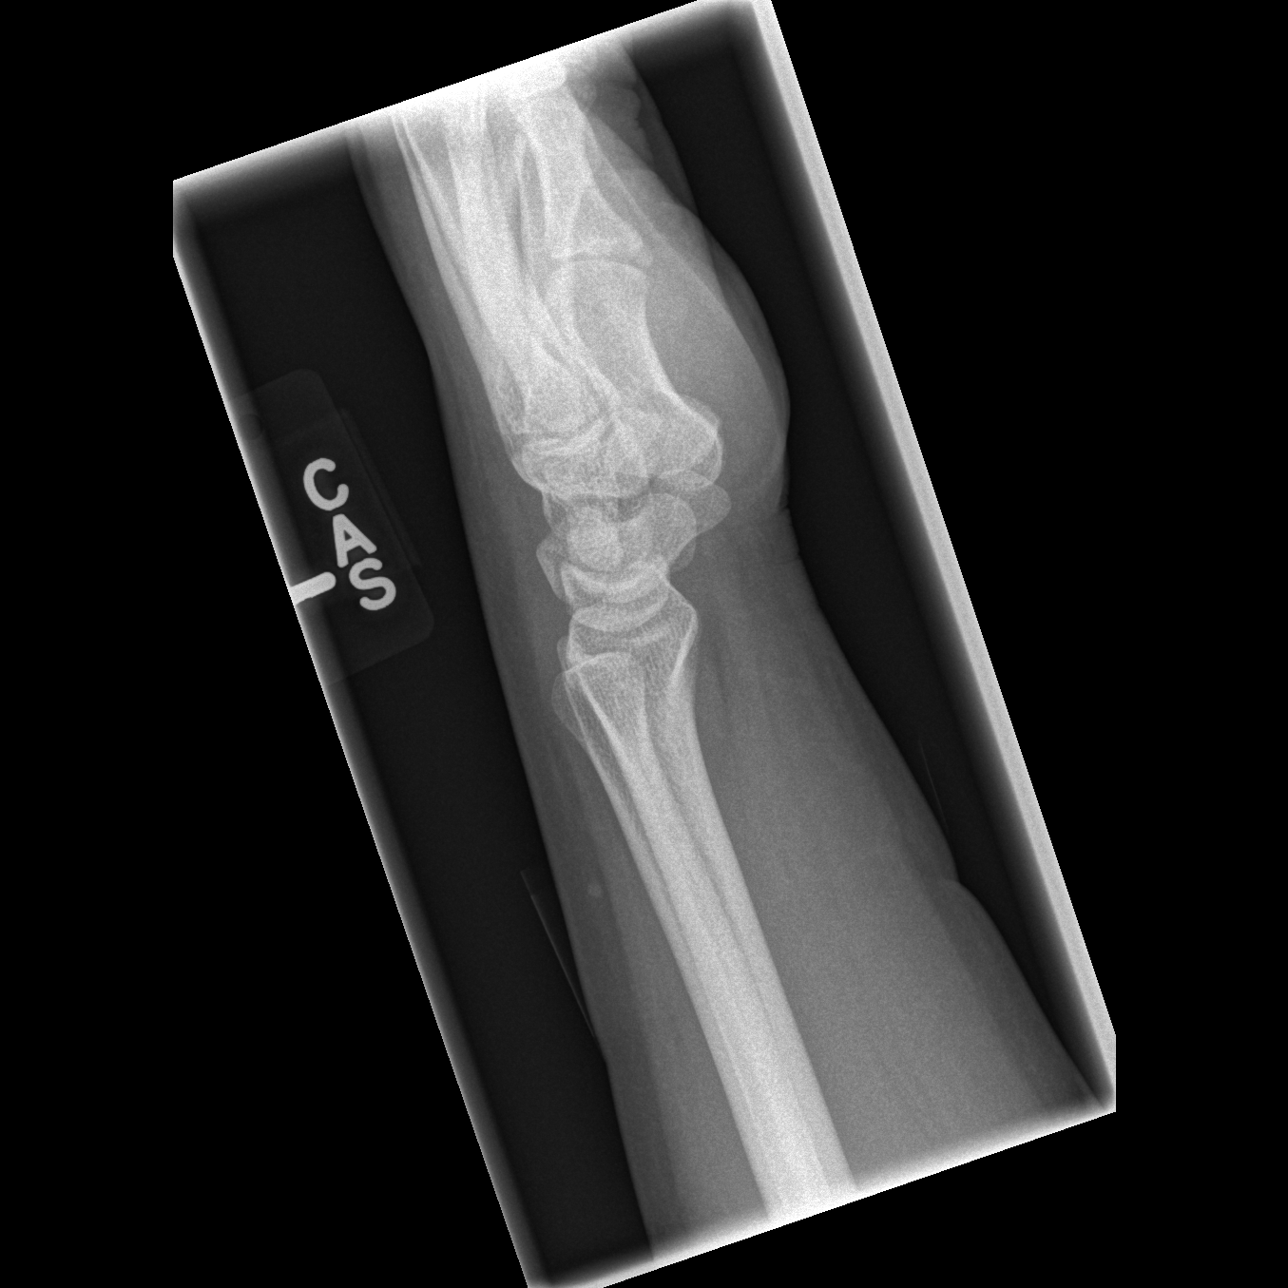

[x wrist navicular]
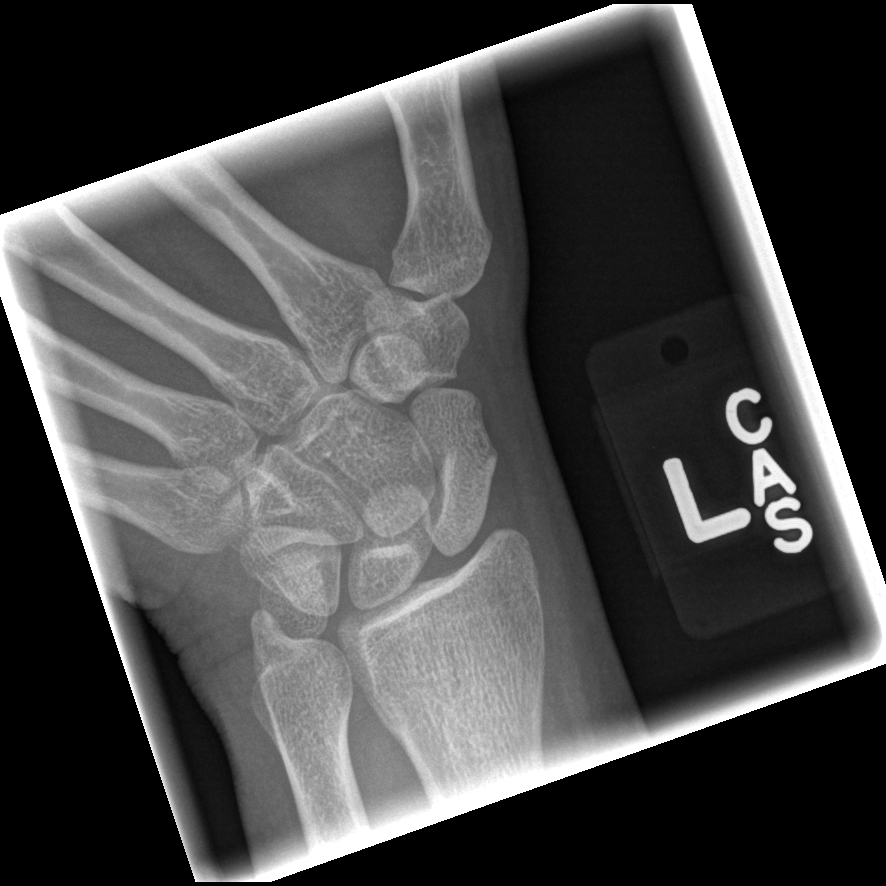

[4 of 4 positions shown; findings below may reference images not displayed]

FINDINGS: [No displaced fracture or dislocation.  No aggressive
osseous lesions.]
IMPRESSION: [No acute osseous abnormality identified.]

## 2012-10-25 IMAGING — CR DG SHOULDER 2+V*L*
3 series · 3 of 3 positions shown · non-contrast
Comparison: 03/11/2009 humeral radiograph

CLINICAL DATA: Left shoulder pain, sickle cell.

LEFT SHOULDER - 2+ VIEW

[w shoulder ap internal left *]
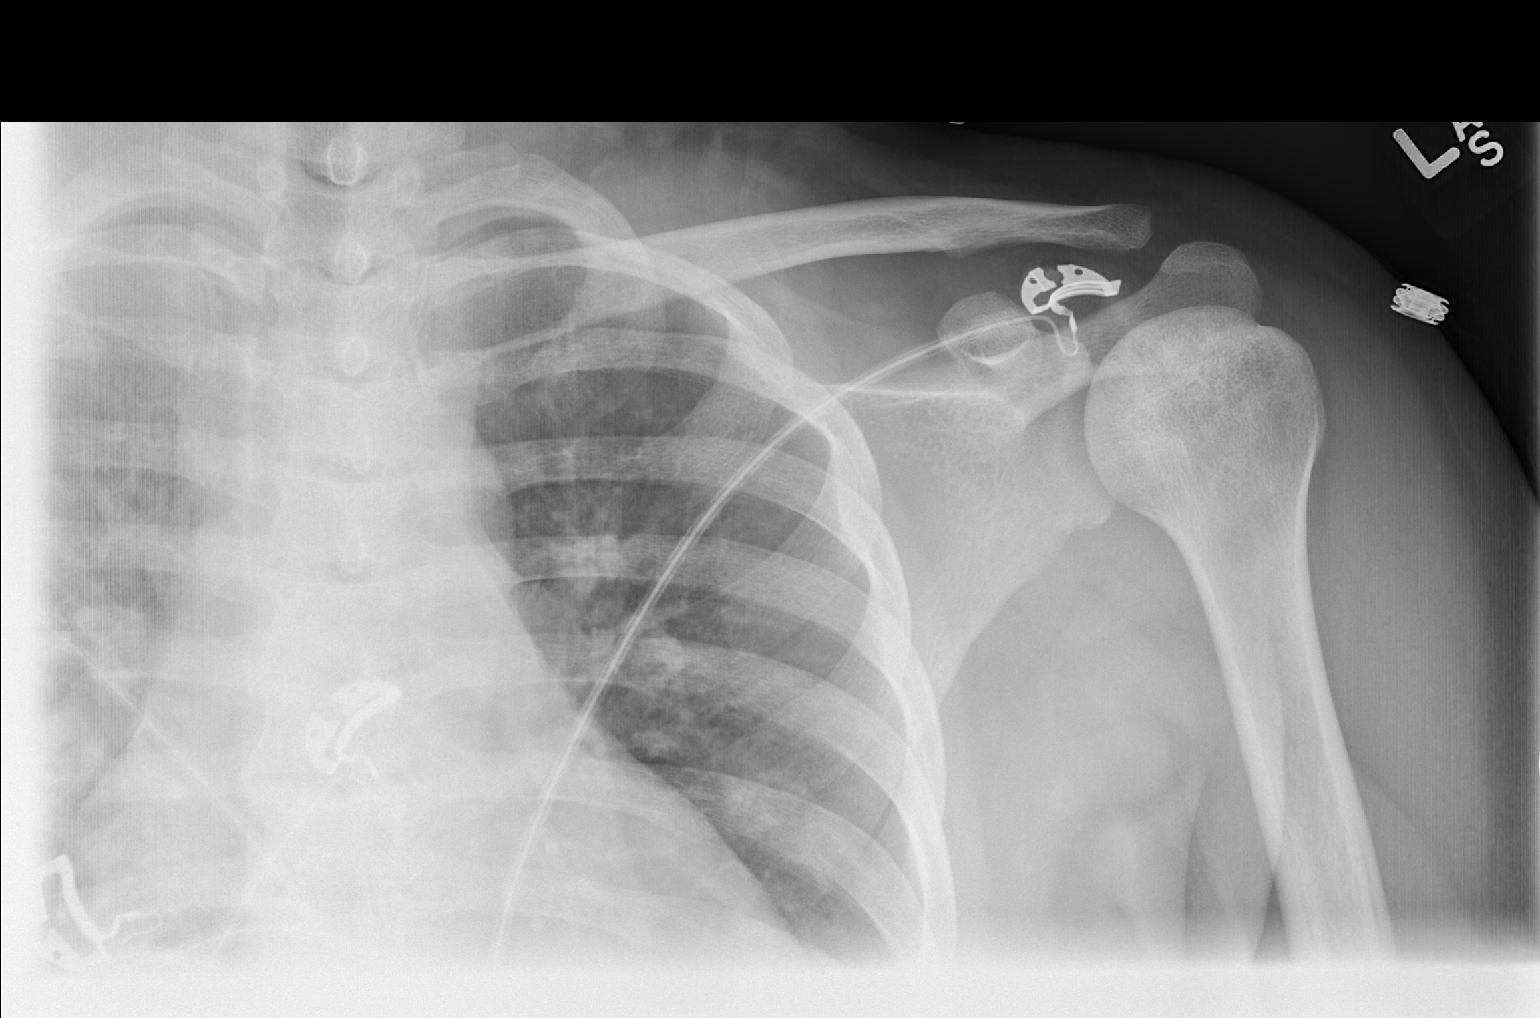

[w shoulder ap external left *]
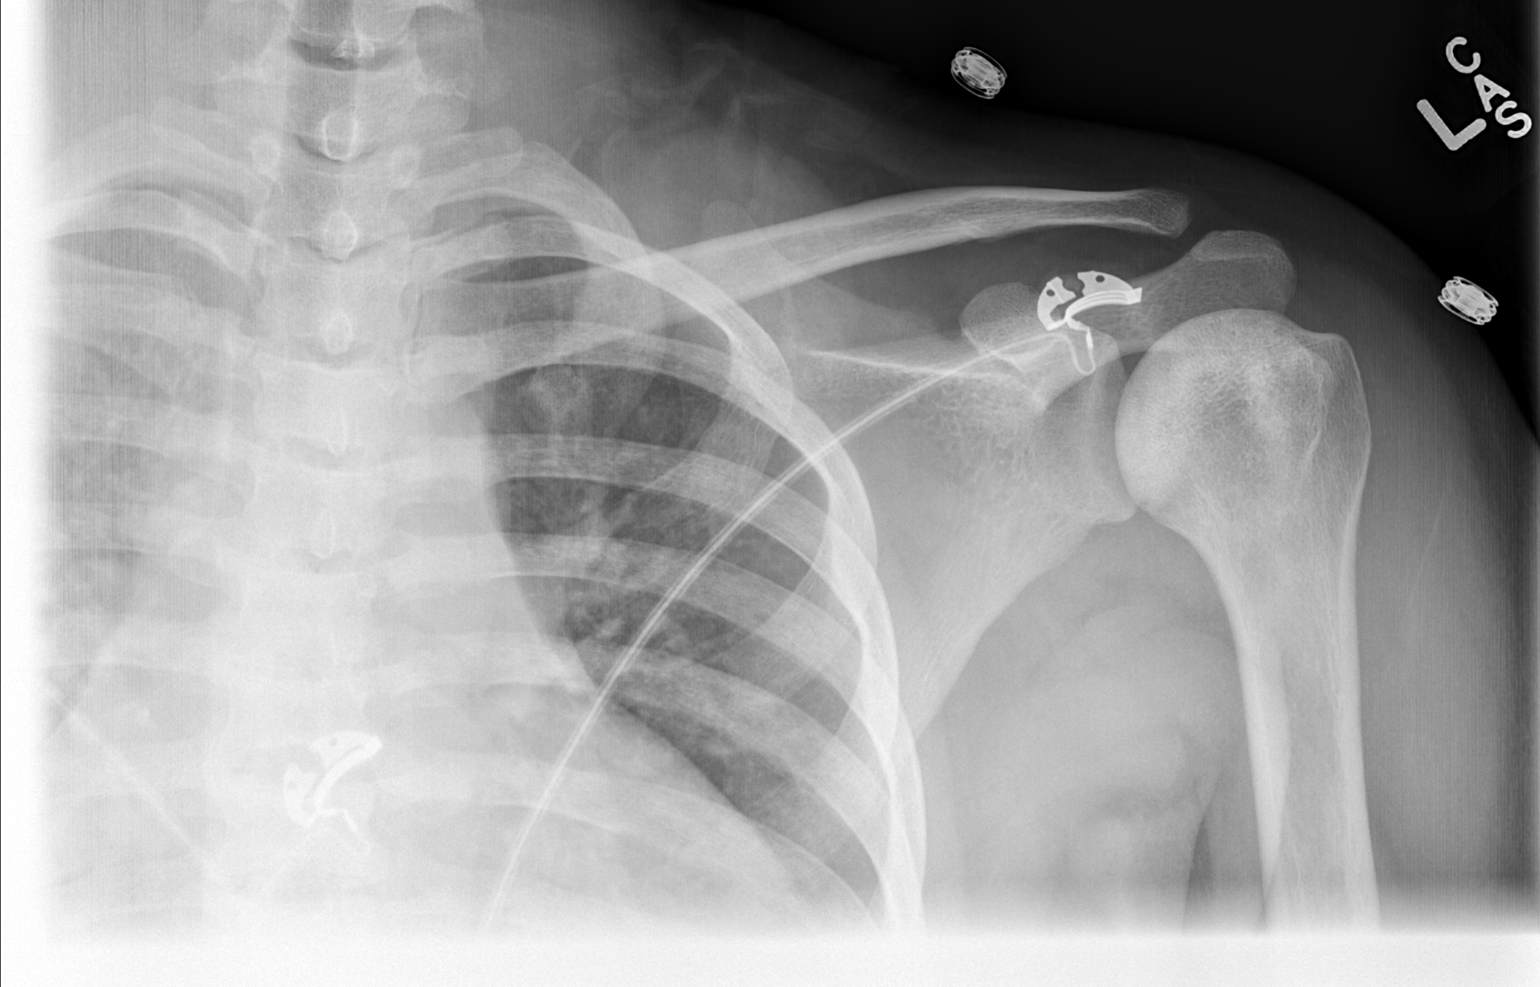

[w shoulder y view left *]
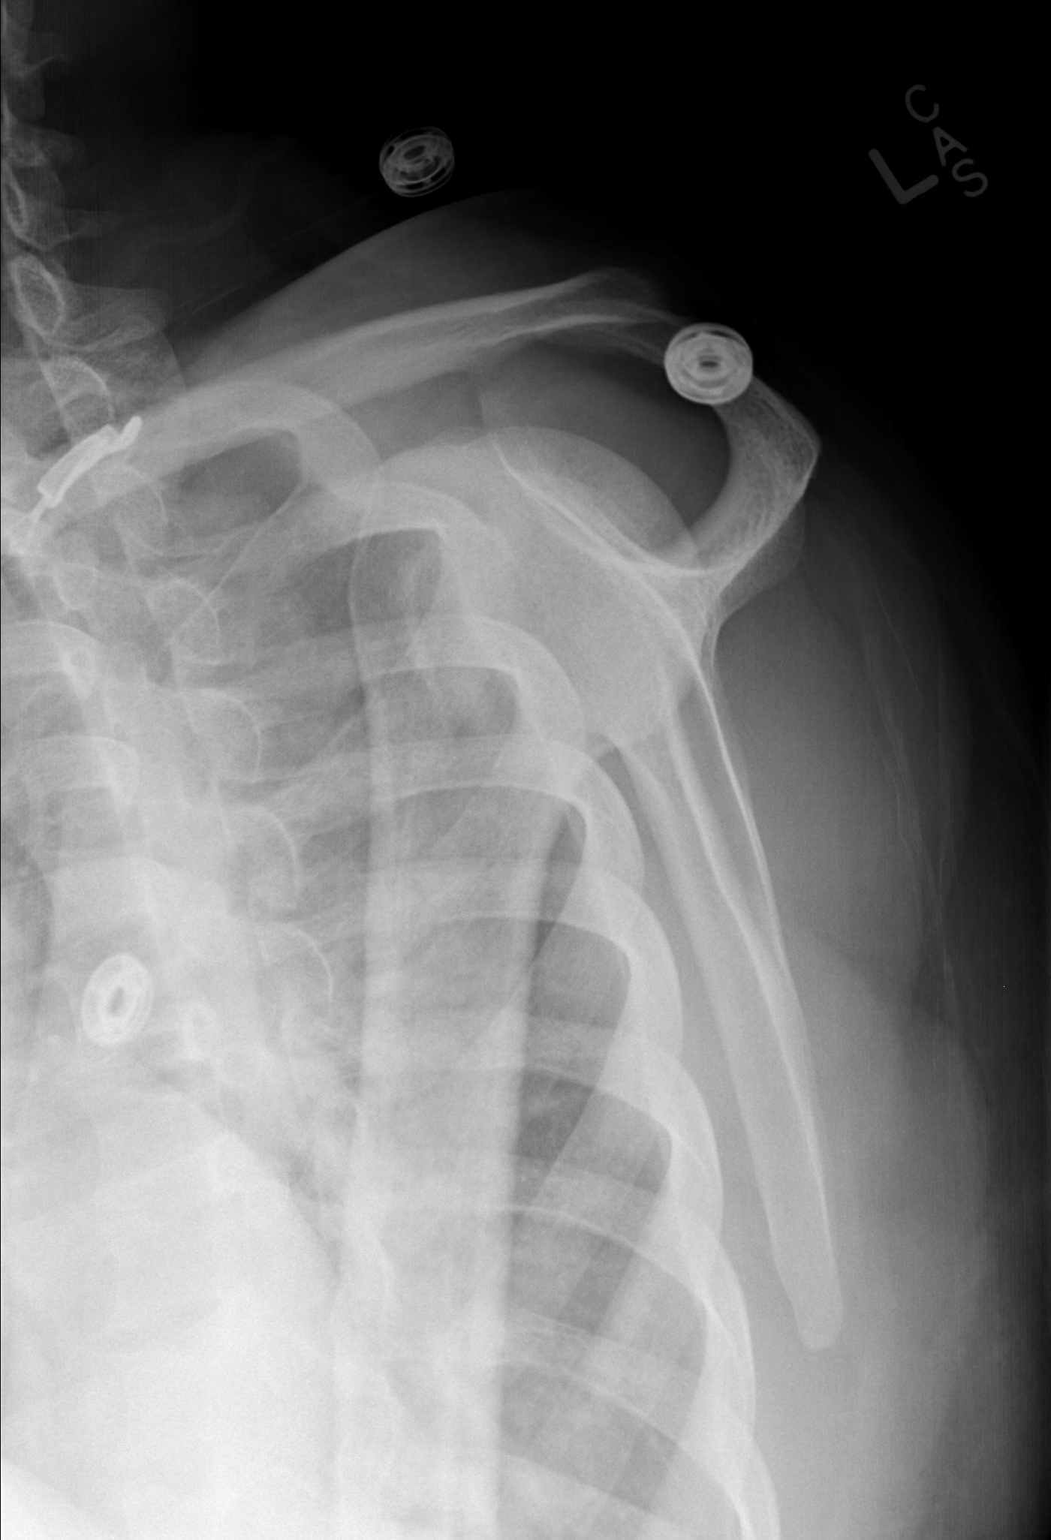

[3 of 3 positions shown; findings below may reference images not displayed]

FINDINGS: There is sclerosis involving the left humeral head, has
progressed in the interval however without collapse.  No acute
fracture or dislocation.  Heart size appears enlarged.  The left
upper lung is predominately clear, with suggestion of bronchitic
change.
IMPRESSION: Progression of left humeral head infarct without collapse.

## 2012-10-25 MED ORDER — OXYCODONE HCL 15 MG PO TABS: 15.0000 mg | ORAL_TABLET | Freq: Four times a day (QID) | ORAL | Status: DC | PRN

## 2012-10-25 MED ORDER — OXYCODONE HCL 5 MG PO TABS: 5.0000 mg | ORAL_TABLET | Freq: Four times a day (QID) | ORAL | Status: DC | PRN

## 2012-10-25 MED ORDER — OXYCODONE HCL 20 MG PO TABS: 20.0000 mg | ORAL_TABLET | Freq: Four times a day (QID) | ORAL | Status: DC | PRN

## 2012-10-25 NOTE — Telephone Encounter (Signed)
Called pt and informed her she could take 20mg  oxy ir Q 6hrs prn she agreed. I called her back to inform she could pick up her prescription tomorrow. This is when she informed me she doesn't actually take them together but if 5mg  doesn't work than she make take the 10mg  later. Refilled for both 5 and 10mg  Oxy IR # 60 given

## 2012-10-27 IMAGING — CR DG CHEST 2V
2 series · 2 of 2 positions shown · non-contrast
Comparison: 12/11/2011

CLINICAL DATA: Sickle cell disease.  Rule out infection.

CHEST - 2 VIEW

[w chest lat]
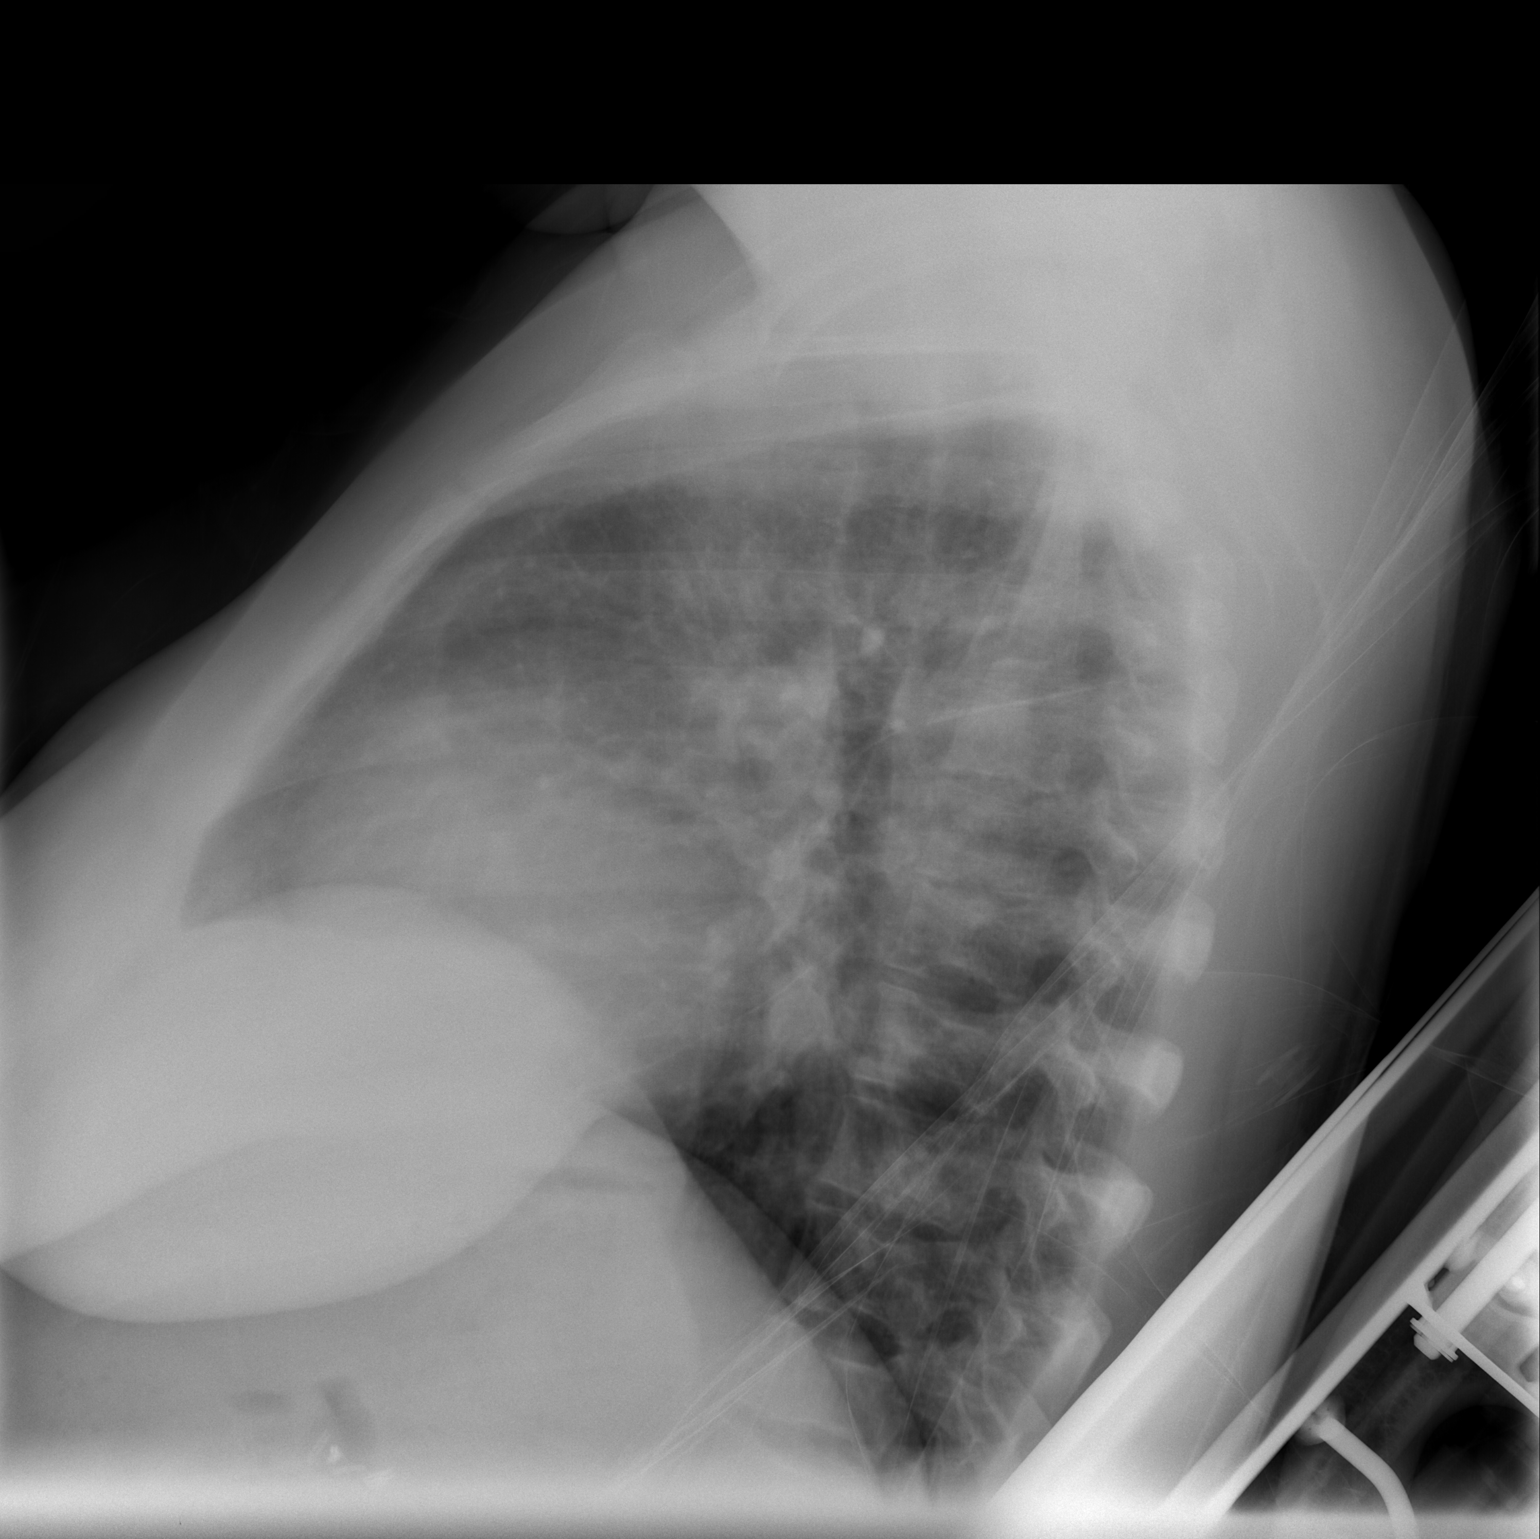

[view not recorded]
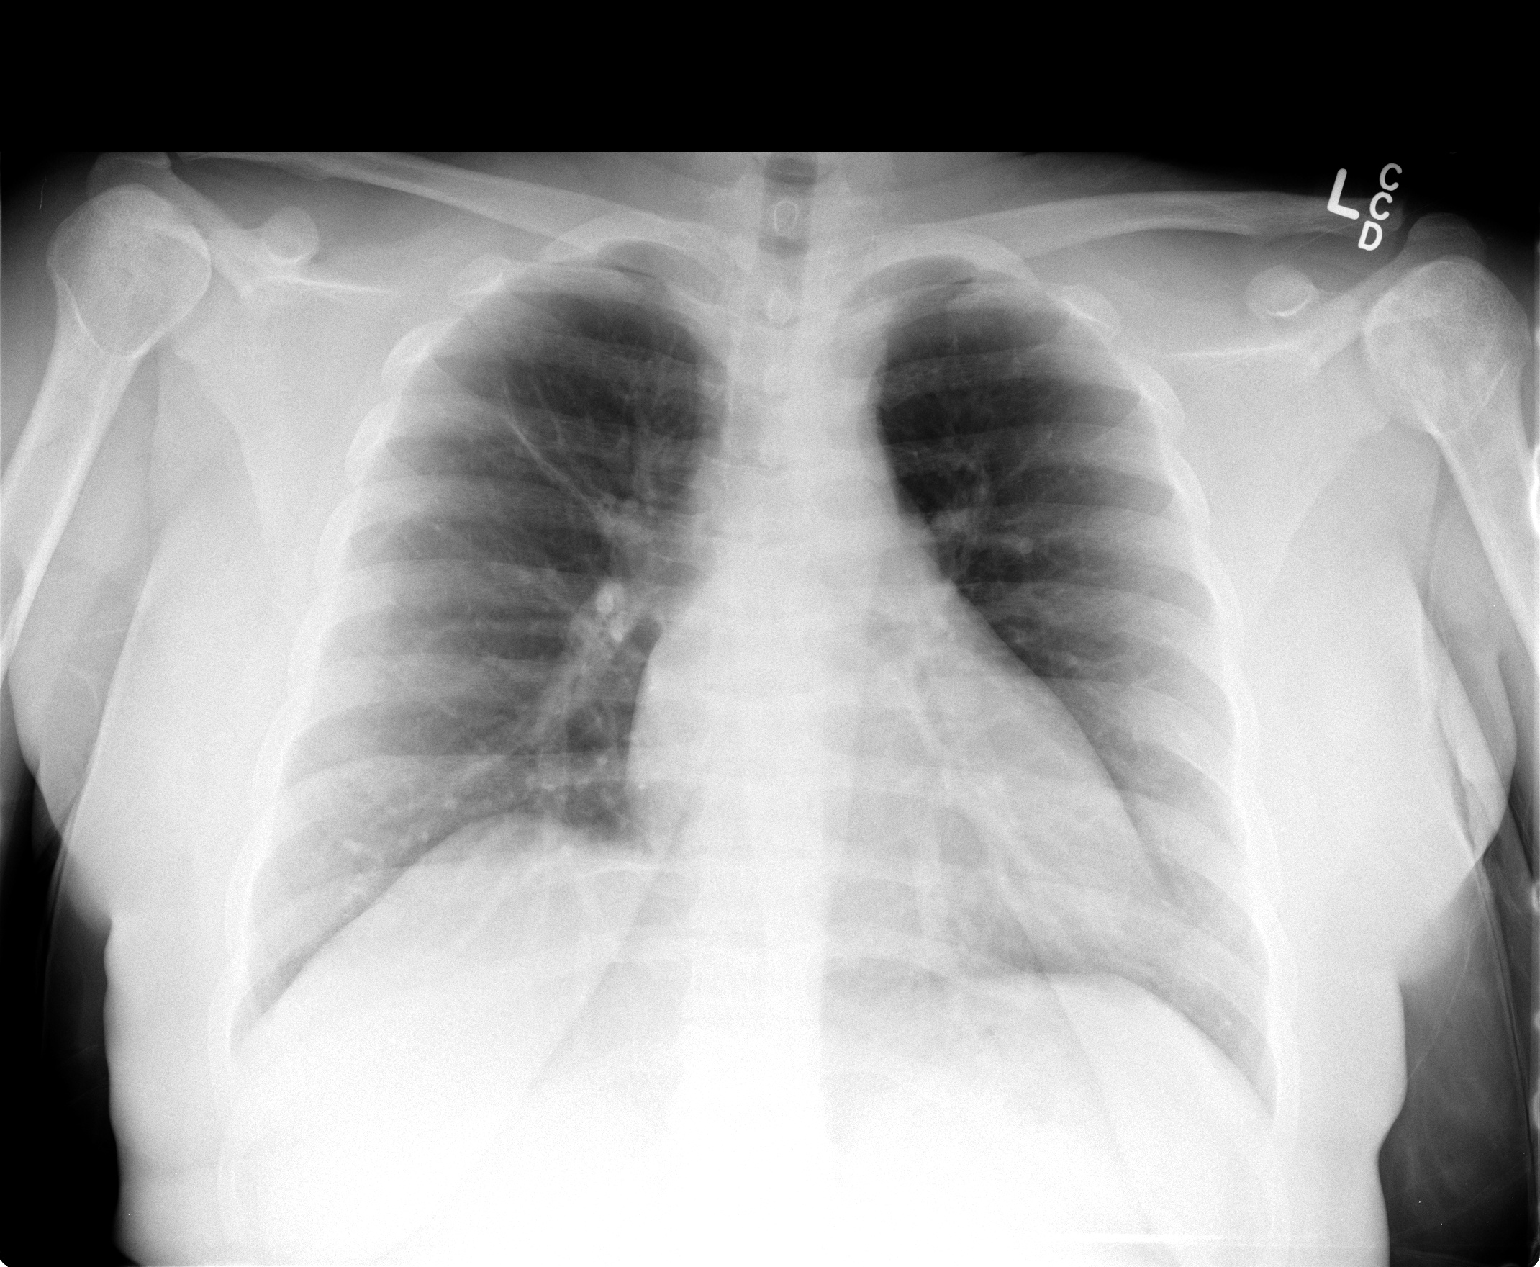

[2 of 2 positions shown; findings below may reference images not displayed]

FINDINGS: Mild cardiac enlargement.  No pleural effusion or edema.
Mild chronic scarring is noted in both lungs.  No airspace
consolidation.

Skeletal changes of sickle cell disease noted.
IMPRESSION: 1.  No evidence for pneumonia or acute chest.
2.  Chronic scarring.

## 2012-10-29 ENCOUNTER — Ambulatory Visit (INDEPENDENT_AMBULATORY_CARE_PROVIDER_SITE_OTHER): Payer: Medicare Other | Admitting: Internal Medicine

## 2012-10-29 ENCOUNTER — Encounter: Payer: Self-pay | Admitting: Internal Medicine

## 2012-10-29 VITALS — BP 122/73 | HR 103 | Temp 98.4°F | Resp 18 | Ht 63.0 in | Wt 191.0 lb

## 2012-10-29 DIAGNOSIS — D571 Sickle-cell disease without crisis: Secondary | ICD-10-CM

## 2012-10-29 DIAGNOSIS — G8929 Other chronic pain: Secondary | ICD-10-CM

## 2012-10-29 MED ORDER — IBUPROFEN 600 MG PO TABS: 600.0000 mg | ORAL_TABLET | Freq: Four times a day (QID) | ORAL | Status: DC | PRN

## 2012-10-29 MED ORDER — OXYCODONE HCL 15 MG PO TABS: ORAL_TABLET | ORAL | Status: DC

## 2012-10-29 MED ORDER — OXYCODONE HCL 5 MG PO TABS: ORAL_TABLET | ORAL | Status: DC

## 2012-10-29 NOTE — Progress Notes (Signed)
Subjective:    Patient ID: [name], female    DOB: November 11, 1990, 22 y.o.   MRN: [id number]  HPI; patient presents today for followup her hemoglobin SS. She states that she is very proud of herself that she has been able to cut down her narcotic use even more than was previously prescribed for her. The patient states that she has started on a walking program every other day and has been feeling much better since doing that. She also has had some goals for herself for next year having to do with her education as well as independently living on her own. She states that currently she is seen to account of her mother's house and that does 2 some pain but it appears to be well controlled on her current medications. As a followup from previously the patient has spoken with her counselor Ms Hosie Poisson is in the process of setting up her appointment.  She denies any headaches, vision changes, nausea, vomiting, diarrhea, fever or chills.    Review of Systems  Constitutional: Positive for activity change (Pt exercising more).  HENT: Negative.   Eyes: Negative.   Respiratory: Negative.   Cardiovascular: Negative.   Gastrointestinal: Negative.   Endocrine: Negative.   Genitourinary: Negative.   Musculoskeletal: Positive for myalgias.  Skin: Negative.   Allergic/Immunologic: Negative.   Neurological: Negative.   Hematological: Negative.   Psychiatric/Behavioral: Negative.        Objective:   Physical Exam  Constitutional: She is oriented to person, place, and time. She appears well-developed and well-nourished.  HENT:  Head: Normocephalic and atraumatic.  Eyes: Conjunctivae and EOM are normal. Pupils are equal, round, and reactive to light. No scleral icterus.  Neck: Normal range of motion. Neck supple. No JVD present.  Cardiovascular: Normal rate and regular rhythm.  Exam reveals no gallop.   No murmur heard. Pulmonary/Chest: Effort normal and breath sounds normal. No respiratory  distress. She has no wheezes. She has no rales.  Abdominal: Soft. Bowel sounds are normal. She exhibits no distension. There is no tenderness.  Musculoskeletal: Normal range of motion. She exhibits no edema and no tenderness.  Lymphadenopathy:    She has no cervical adenopathy.  Neurological: She is alert and oriented to person, place, and time. No cranial nerve deficit. She exhibits normal muscle tone.  Skin: Skin is warm and dry.  Psychiatric: She has a normal mood and affect. Her behavior is normal. Judgment and thought content normal.          Assessment & Plan:  1. Hb SS: With regard to her disease management the patient has continued to be compliant with her hydroxyurea and folic acid. She states that she has been feeling well. The patient has recently started on her exercise program we she's been walking the track of every other day basis. She also states that she is motivated to change her eating habits to include more fruits, vegetables, and water.   With regard to management of her symptoms, the patient states that her pain is a level where she fell she could decrease her pain medication and instead of taking 20 mg 3 times a day of oxycodone she has been taking 15 mg, 10 mg, 15 mg on every 8 hour basis. We'll continue on this dosing for now and reevaluate in approximately 2 weeks for further weaning. I reviewed the patient's labs from 10/17/2012 they're within acceptable limits. The patient will return to clinic in one month and have labs done  the day before clinic.  2. Depression: The patient states that she still feels she's having periods of depression. Zoloft was ordered for her her last visit however her she was unaware that the prescriptions at the pharmacy. That she has not yet started the Zoloft. The patient's been instructed to keep up her Zoloft from the pharmacy without prescription was issued last month with 2 refills. Patient has also initiated contact with the counselor  and outpatient basis and should start counseling sessions in the near future.

## 2012-11-02 ENCOUNTER — Encounter (HOSPITAL_COMMUNITY): Payer: Self-pay | Admitting: Emergency Medicine

## 2012-11-02 ENCOUNTER — Inpatient Hospital Stay (HOSPITAL_COMMUNITY)
Admission: EM | Admit: 2012-11-02 | Discharge: 2012-11-05 | DRG: 812 | Disposition: A | Discharge status: Home / Self Care | Payer: Medicare Other | Attending: Internal Medicine | Admitting: Internal Medicine

## 2012-11-02 DIAGNOSIS — E860 Dehydration: Secondary | ICD-10-CM | POA: Diagnosis not present

## 2012-11-02 DIAGNOSIS — D571 Sickle-cell disease without crisis: Secondary | ICD-10-CM

## 2012-11-02 DIAGNOSIS — D649 Anemia, unspecified: Secondary | ICD-10-CM

## 2012-11-02 DIAGNOSIS — D5700 Hb-SS disease with crisis, unspecified: Secondary | ICD-10-CM | POA: Diagnosis not present

## 2012-11-02 DIAGNOSIS — Z79899 Other long term (current) drug therapy: Secondary | ICD-10-CM | POA: Diagnosis not present

## 2012-11-02 DIAGNOSIS — M87029 Idiopathic aseptic necrosis of unspecified humerus: Secondary | ICD-10-CM | POA: Diagnosis present

## 2012-11-02 DIAGNOSIS — Z87891 Personal history of nicotine dependence: Secondary | ICD-10-CM

## 2012-11-02 DIAGNOSIS — K219 Gastro-esophageal reflux disease without esophagitis: Secondary | ICD-10-CM | POA: Diagnosis present

## 2012-11-02 DIAGNOSIS — G8929 Other chronic pain: Secondary | ICD-10-CM

## 2012-11-02 MED ORDER — KETOROLAC TROMETHAMINE 30 MG/ML IJ SOLN: 30.0000 mg | Freq: Once | INTRAMUSCULAR | Status: DC

## 2012-11-02 MED ORDER — KETOROLAC TROMETHAMINE 30 MG/ML IJ SOLN
30.0000 mg | Freq: Four times a day (QID) | INTRAMUSCULAR | Status: DC
  Administered 2012-11-02 – 2012-11-05 (×12): 30 mg via INTRAVENOUS
  Filled 2012-11-02 (×17): qty 1

## 2012-11-02 MED ORDER — HYDROMORPHONE HCL PF 2 MG/ML IJ SOLN
4.0000 mg | INTRAMUSCULAR | Status: DC | PRN
  Administered 2012-11-02 (×2): 4 mg via INTRAVENOUS
  Filled 2012-11-02 (×2): qty 2

## 2012-11-02 MED ORDER — SODIUM CHLORIDE 0.9 % IV SOLN
25.0000 mg | INTRAVENOUS | Status: DC | PRN
  Filled 2012-11-02: qty 0.5

## 2012-11-02 MED ORDER — ONDANSETRON HCL 4 MG PO TABS: 4.0000 mg | ORAL_TABLET | ORAL | Status: DC | PRN

## 2012-11-02 MED ORDER — SODIUM CHLORIDE 0.9 % IV BOLUS (SEPSIS): 1000.0000 mL | Freq: Once | INTRAVENOUS | Status: DC

## 2012-11-02 MED ORDER — HYDROMORPHONE HCL PF 2 MG/ML IJ SOLN
4.0000 mg | INTRAMUSCULAR | Status: DC
  Administered 2012-11-02 – 2012-11-03 (×8): 4 mg via INTRAVENOUS
  Filled 2012-11-02 (×8): qty 2

## 2012-11-02 MED ORDER — DIPHENHYDRAMINE HCL 25 MG PO CAPS
25.0000 mg | ORAL_CAPSULE | ORAL | Status: DC | PRN
  Administered 2012-11-03: 25 mg via ORAL
  Filled 2012-11-02: qty 1

## 2012-11-02 MED ORDER — DEXTROSE-NACL 5-0.45 % IV SOLN
INTRAVENOUS | Status: DC
  Administered 2012-11-02: 13:00:00 via INTRAVENOUS
  Administered 2012-11-02 – 2012-11-03 (×2): 1000 mL via INTRAVENOUS
  Administered 2012-11-03 – 2012-11-05 (×5): via INTRAVENOUS

## 2012-11-02 MED ORDER — SERTRALINE HCL 50 MG PO TABS
50.0000 mg | ORAL_TABLET | Freq: Every day | ORAL | Status: DC
  Administered 2012-11-02 – 2012-11-05 (×4): 50 mg via ORAL
  Filled 2012-11-02 (×4): qty 1

## 2012-11-02 MED ORDER — HYDROXYUREA 500 MG PO CAPS
1000.0000 mg | ORAL_CAPSULE | Freq: Every day | ORAL | Status: DC
  Administered 2012-11-02 – 2012-11-04 (×3): 1000 mg via ORAL
  Filled 2012-11-02 (×4): qty 2

## 2012-11-02 MED ORDER — ONDANSETRON HCL 4 MG/2ML IJ SOLN
4.0000 mg | Freq: Once | INTRAMUSCULAR | Status: DC
  Filled 2012-11-02: qty 2

## 2012-11-02 MED ORDER — FOLIC ACID 1 MG PO TABS
1.0000 mg | ORAL_TABLET | Freq: Every day | ORAL | Status: DC
  Administered 2012-11-02 – 2012-11-05 (×4): 1 mg via ORAL
  Filled 2012-11-02 (×4): qty 1

## 2012-11-02 MED ORDER — HYDROMORPHONE HCL PF 2 MG/ML IJ SOLN
2.0000 mg | Freq: Once | INTRAMUSCULAR | Status: DC
  Filled 2012-11-02: qty 1

## 2012-11-02 MED ORDER — ONDANSETRON HCL 4 MG/2ML IJ SOLN: 4.0000 mg | INTRAMUSCULAR | Status: DC | PRN

## 2012-11-02 MED ORDER — POLYETHYLENE GLYCOL 3350 17 G PO PACK
17.0000 g | PACK | Freq: Every day | ORAL | Status: DC
  Administered 2012-11-02 – 2012-11-05 (×4): 17 g via ORAL
  Filled 2012-11-02 (×4): qty 1

## 2012-11-02 NOTE — ED Notes (Signed)
Patient reports onset of cough and sickle cell crises at 1900 yesterday with worsening generalized pain throughout the night. Patient reports that she is currently menstruating and that this often exacerbates her sickle cell pain but that she is usually able to "control it."

## 2012-11-02 NOTE — Plan of Care (Signed)
Problem: Phase I Progression Outcomes Goal: Pulmonary Hygiene as Indicated (Sickle Cell) Outcome: Progressing Encourage use of IS         

## 2012-11-02 NOTE — Progress Notes (Signed)
Report given to [facility], pt to transfer to room 1318.

## 2012-11-02 NOTE — ED Notes (Signed)
Two nurses attempted IV access. Both nurses unsuccessful.

## 2012-11-02 NOTE — ED Provider Notes (Signed)
History     CSN: [id number]  Arrival date & time 11/02/12  1004   First MD Initiated Contact with Patient 11/02/12 1014      Chief Complaint  Patient presents with  . Sickle Cell Pain Crisis    (Consider location/radiation/quality/duration/timing/severity/associated sxs/prior treatment) Patient is a 22 y.o. female presenting with sickle cell pain. The history is provided by the patient.  Sickle Cell Pain Crisis Location:  Upper extremity and back Severity:  Severe Onset quality:  Gradual Duration:  2 days Similar to previous crisis episodes: yes   Timing:  Constant Progression:  Worsening Chronicity:  Recurrent Relieved by:  Nothing Worsened by:  Nothing tried Ineffective treatments:  Prescription drugs Associated symptoms: no chest pain, no congestion, no cough, no fever and no shortness of breath     Past Medical History  Diagnosis Date  . H/O: 1 miscarriage 03/22/2011  . 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 Depression 01/06/2011  . GERD (gastroesophageal reflux disease) 02/17/2011  . Trichotillomania     h/o  . Blood transfusion     "lots"  . Sickle cell anemia with crisis   . Exertional dyspnea     "sometimes"  . Sickle cell anemia   . Headache(784.0)   . Migraines 11/08/11    "@ least twice/month"  . Chronic back pain     "very severe; have knot in my back; from tight muscle; take RX and exercise for it"  . Mood swings 11/08/11    "I go back and forth; real bad"  . Pregnancy   . Blood dyscrasia     SICKLE CELL    Past Surgical History  Procedure Laterality Date  . Cholecystectomy  05/2010  . Dilation and curettage of uterus  02/20/11    S/P miscarriage    Family History  Problem Relation Age of Onset  . Diabetes Mother   . Alcoholism    . Depression    . Hypercholesterolemia    . Hypertension    . Migraines    . Diabetes Maternal Grandmother   . Diabetes Paternal Grandmother     History  Substance Use Topics  . Smoking status: Former Smoker   Quit date: 06/17/2011  . Smokeless tobacco: Never Used  . Alcohol Use: No     Comment: pt states she quit in May 2013. She has been without marijuana, tobacco or ETOH for 1 year. She is enrolled in school    OB History   Grav Para Term Preterm Abortions TAB SAB Ect Mult Living   2    1  1         Obstetric Comments   Miscarried in October 2012 at about 7 weeks      Review of Systems  Constitutional: Negative for fever.  HENT: Negative for congestion.   Respiratory: Negative for cough and shortness of breath.   Cardiovascular: Negative for chest pain.  All other systems reviewed and are negative.    Allergies  Carrot oil and Latex  Home Medications   Current Outpatient Rx  Name  Route  Sig  Dispense  Refill  . folic acid (FOLVITE) 800 MCG tablet   Oral   Take 800 mcg by mouth every morning.         . hydroxyurea (HYDREA) 500 MG capsule   Oral   Take 2 capsules (1,000 mg total) by mouth at bedtime. May take with food to minimize GI side effects.   30 capsule   0   . ibuprofen (ADVIL,MOTRIN)  600 MG tablet   Oral   Take 1 tablet (600 mg total) by mouth every 6 (six) hours as needed for pain.   60 tablet   0   . oxyCODONE (OXY IR/ROXICODONE) 5 MG immediate release tablet      10 mg 1 x day as second dose in q 8 hour dosing (medication being weaned).   60 tablet   0   . oxyCODONE (ROXICODONE) 15 MG immediate release tablet      15 mg every am and pm q 16 hours PRN for pain (taken as q 8hrs PRN with 10 mg as second dose-medication being weaned).   60 tablet   0   . polyethylene glycol (MIRALAX / GLYCOLAX) packet   Oral   Take 17 g by mouth daily.   30 each   0   . Prenatal Vit-Fe Fumarate-FA (PRENATAL MULTIVITAMIN) TABS   Oral   Take 1 tablet by mouth daily at 12 noon.         . sertraline (ZOLOFT) 50 MG tablet   Oral   Take 1 tablet (50 mg total) by mouth daily.   30 tablet   3     BP 117/81  Pulse 85  Temp(Src) 97.5 F (36.4 C) (Oral)   Resp 14  Ht 5\' 3"  (1.6 m)  Wt 190 lb (86.183 kg)  BMI 33.67 kg/m2  SpO2 97%  LMP 10/28/2012  Physical Exam  Nursing note and vitals reviewed. Constitutional: She is oriented to person, place, and time. She appears well-developed and well-nourished. No distress.  HENT:  Head: Normocephalic and atraumatic.  Mouth/Throat: Oropharynx is clear and moist.  Eyes: EOM are normal. Pupils are equal, round, and reactive to light.  Neck: Normal range of motion. Neck supple.  Cardiovascular: Normal rate, regular rhythm and intact distal pulses.   No murmur heard. Pulmonary/Chest: Effort normal and breath sounds normal. No respiratory distress. She has no wheezes. She has no rales. She exhibits no tenderness.  Abdominal: Soft. Bowel sounds are normal.  Musculoskeletal: Normal range of motion. She exhibits no edema.  There is ttp in the mid-back and left arm.  Both appear grossly normal.  Neurological: She is alert and oriented to person, place, and time.  Skin: Skin is warm and dry. She is not diaphoretic.    ED Course  Procedures (including critical care time)  Labs Reviewed  CBC WITH DIFFERENTIAL  BASIC METABOLIC PANEL  RETICULOCYTES   No results found.   No diagnosis found.    MDM  Attempts at iv access were made but were initially unsuccessful.  Finally, access was obtained in the thumb.  The sickle cell clinic called and wants her there.  She was transferred there.        Geoffery Lyons, MD 11/02/12 [phone]

## 2012-11-02 NOTE — ED Notes (Signed)
MD at bedside. 

## 2012-11-02 NOTE — H&P (Signed)
Hospital Admission Note Date: 11/02/2012  Patient name: [name] Medical record number: [id number] Date of birth: [birth year] Age: 22 y.o. Gender: female PCP: Axtyn Woehler A., MD  Attending physician: Altha Harm, MD  Chief Complaint:Pain in back and legs   History of Present Illness: Pt with Hb SS here with c/o pain in back and legs. Pain is characteristic of pain of VOC. No N/V/D/F/C. Pt feels that her crisis was triggered by weather and walking.[facility] she was [facility] with IV analgesics. Pt's pain was still poorly controlled after several doses of analgesics and patient was unable to ambulate secondary to pain. Thus she is being transferred to the hospital for further symptom management to the goal of functional improvement     Scheduled Meds: . folic acid  1 mg Oral Daily  .  HYDROmorphone (DILAUDID) injection  4 mg Intravenous Q2H  . hydroxyurea  1,000 mg Oral QHS  . ketorolac  30 mg Intravenous Q6H  . ketorolac  30 mg Intravenous Once  . polyethylene glycol  17 g Oral Daily  . sertraline  50 mg Oral Daily   Continuous Infusions: . dextrose 5 % and 0.45% NaCl 125 mL/hr at 11/02/12 1325   PRN Meds:.diphenhydrAMINE (BENADRYL) IVPB(SICKLE CELL ONLY), diphenhydrAMINE, ondansetron (ZOFRAN) IV, ondansetron Allergies: Carrot oil and Latex Past Medical History  Diagnosis Date  . H/O: 1 miscarriage 03/22/2011  . 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 Depression 01/06/2011  . GERD (gastroesophageal reflux disease) 02/17/2011  . Trichotillomania     h/o  . Blood transfusion     "lots"  . Sickle cell anemia with crisis   . Exertional dyspnea     "sometimes"  . Sickle cell anemia   . Headache(784.0)   . Migraines 11/08/11    "@ least twice/month"  . Chronic back pain     "very severe; have knot in my back; from tight muscle; take RX and exercise for it"  . Mood swings 11/08/11    "I go back and forth; real bad"  . Pregnancy   .  Blood dyscrasia     SICKLE CELL   Past Surgical History  Procedure Laterality Date  . Cholecystectomy  05/2010  . Dilation and curettage of uterus  02/20/11    S/P miscarriage   Family History  Problem Relation Age of Onset  . Diabetes Mother   . Alcoholism    . Depression    . Hypercholesterolemia    . Hypertension    . Migraines    . Diabetes Maternal Grandmother   . Diabetes Paternal Grandmother    History   Social History  . Marital Status: Single    Spouse Name: N/A    Number of Children: N/A  . Years of Education: N/A   Occupational History  . Not on file.   Social History Main Topics  . Smoking status: Former Smoker    Quit date: 06/17/2011  . Smokeless tobacco: Never Used  . Alcohol Use: No     Comment: pt states she quit in May 2013. She has been without marijuana, tobacco or ETOH for 1 year. She is enrolled in school  . Drug Use: No  . Sexually Active: Not Currently    Birth Control/ Protection: None   Other Topics Concern  . 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 Physical Exam: No intake or output data in the 24 hours ending 11/02/12 1729 General: Alert, awake, oriented x3, in moderate distress.  Vital Signs:BP 124/71,HR 100, T 98.8 F (37.1 C), temperature source Oral, RR 18, height 5\' 3"  (1.6 m), weight 190 lb (86.183 kg), last menstrual period 10/28/2012, SpO2 96.00%.  HEENT: [place]/AT PEERL, EOMI, anicteric  Neck: Trachea midline, no masses, no thyromegal,y no JVD, no carotid bruit  OROPHARYNX: Moist, No exudate/ erythema/lesions.  Heart: Regular rate and rhythm, without murmurs, rubs, gallops, PMI non-displaced, no heaves or thrills on palpation.  Lungs: Clear to auscultation, no wheezing or rhonchi noted.  Abdomen: Soft, nontender, nondistended,  positive bowel sounds, no masses no hepatosplenomegaly noted.  Neuro:  No focal neurological deficits noted cranial nerves II through XII grossly intact. Strength functional in bilateral upper and lower extremities. Musculoskeletal: No warm swelling or erythema around joints Psychiatric: Patient alert and oriented x3, good insight and cognition, good recent to remote recall.   Lab results: No results found for this basename: NA, K, CL, CO2, GLUCOSE, BUN, CREATININE, CALCIUM, MG, PHOS,  in the last 72 hours No results found for this basename: AST, ALT, ALKPHOS, BILITOT, PROT, ALBUMIN,  in the last 72 hours No results found for this basename: LIPASE, AMYLASE,  in the last 72 hours No results found for this basename: WBC, NEUTROABS, HGB, HCT, MCV, PLT,  in the last 72 hours No results found for this basename: CKTOTAL, CKMB, CKMBINDEX, TROPONINI,  in the last 72 hours No components found with this basename: POCBNP,  No results found for this basename: DDIMER,  in the last 72 hours No results found for this basename: HGBA1C,  in the last 72 hours No results found for this basename: CHOL, HDL, LDLCALC, TRIG, CHOLHDL, LDLDIRECT,  in the last 72 hours No results found for this basename: TSH, T4TOTAL, FREET3, T3FREE, THYROIDAB,  in the last 72 hours No results found for this basename: VITAMINB12, FOLATE, FERRITIN, TIBC, IRON, RETICCTPCT,  in the last 72 hours Imaging results:  Dg Chest 2 View  10/05/2012   *RADIOLOGY REPORT*  Clinical Data: Chest pain, shortness of breath, sickle cell crisis  CHEST - 2 VIEW  Comparison: 08/27/2012  Findings: Lung volumes are low with crowding of the bronchovascular markings.  Heart size mildly enlarged with central vascular congestion.  No focal pulmonary opacity.  No pleural effusion.  No acute osseous finding.  No significant change in fish mouth deformity of vertebral bodies typical for sickle cell disease. Cholecystectomy clips noted.  IMPRESSION: Low lung volumes  with mild cardiomegaly and central vascular congestion.   Original Report Authenticated By: Christiana Pellant, M.D.   Ir Fluoro Guide Cv Line Right  10/10/2012   *RADIOLOGY REPORT*  Clinical Data: Sickle cell anemia with crisis, poor venous access; request is made for central venous access for fluids and medications.  PICC LINE PLACEMENT WITH ULTRASOUND AND FLUOROSCOPIC  GUIDANCE  Fluoroscopy Time: 0.2 minutes  The right arm was prepped with chlorhexidine, draped in the usual sterile fashion using maximum barrier technique (cap and mask, sterile gown, sterile gloves, large sterile sheet, hand hygiene and cutaneous antisepsis) and infiltrated locally with 1% Lidocaine.  Ultrasound demonstrated patency of the right basilic vein, and this was documented with an image.  Under real-time ultrasound guidance, this vein was accessed with a 21 gauge micropuncture needle and image documentation was performed.  The  needle was exchanged over a guidewire for a peel-away sheath through which a five [place] double lumen PICC trimmed to 37 cm was advanced, positioned with its tip at the lower SVC/right atrial junction.  Fluoroscopy during the procedure and fluoro spot radiograph confirms appropriate catheter position.  The catheter was flushed, secured to the skin with Prolene sutures, and covered with a sterile dressing.  Complications:  None  IMPRESSION: Successful right arm PICC line placement with ultrasound and fluoroscopic guidance.  The catheter is ready for use.  Read by: Jeananne Rama, P.A.-C  IR RIGHT FLOURO GUIDE CV LINE  Fluoroscopy Time: dictate in minutes & seconds  Comparison: None.  Findings:  IMPRESSION:   Original Report Authenticated By: D. Andria Rhein, MD   Ir US Guide Vasc Access Right  10/18/2012   *RADIOLOGY REPORT*  Clinical Data: Sickle cell anemia with crisis, poor venous access; request is made for central venous access for fluids and medications.  PICC LINE PLACEMENT WITH ULTRASOUND AND FLUOROSCOPIC   GUIDANCE  Fluoroscopy Time: 0.2 minutes  The right arm was prepped with chlorhexidine, draped in the usual sterile fashion using maximum barrier technique (cap and mask, sterile gown, sterile gloves, large sterile sheet, hand hygiene and cutaneous antisepsis) and infiltrated locally with 1% Lidocaine.  Ultrasound demonstrated patency of the right basilic vein, and this was documented with an image.  Under real-time ultrasound guidance, this vein was accessed with a 21 gauge micropuncture needle and image documentation was performed.  The needle was exchanged over a guidewire for a peel-away sheath through which a five [place] double lumen PICC trimmed to 37 cm was advanced, positioned with its tip at the lower SVC/right atrial junction.  Fluoroscopy during the procedure and fluoro spot radiograph confirms appropriate catheter position.  The catheter was flushed, secured to the skin with Prolene sutures, and covered with a sterile dressing.  Complications:  None  IMPRESSION: Successful right arm PICC line placement with ultrasound and fluoroscopic guidance.  The catheter is ready for use.  Read by: Jeananne Rama, P.A.-C  IR RIGHT FLOURO GUIDE CV LINE  Fluoroscopy Time: dictate in minutes & seconds  Comparison: None.  Findings:  IMPRESSION:   Original Report Authenticated By: D. Andria Rhein, MD   Assessment/Plan:  Active Problems:  Hb SS with Crisis: Pt appears to be in early crisis. Will obtain labs (CBC wit diff, CMET, reticulocyte). Start aggresive hydration and analgesia. Once renal function is assessed, will consider Toradol for adjunctive therapy.  Dehydration: Pt presents with clincial dehydration which is also reflected in hemoconcentration. Will continue IV hydration with @125  ml/hr. Reassess in the morning.    Kirstina Leinweber A. 11/02/2012, 5:29 PM

## 2012-11-02 NOTE — Progress Notes (Signed)
Pt transferred to room 1318 via wheelchair with IV fluids running to keep IV patent.  Pt greeted by nurse Belenda Cruise, pt in no obvious distress.

## 2012-11-02 NOTE — Discharge Summary (Signed)
[name] MRN: [id number] DOB/AGE: 1991-04-01 22 y.o.  Admit date: 11/02/2012 Discharge date: 11/02/2012  Primary Care Physician:  MATTHEWS,MICHELLE A., MD   Discharge Diagnoses:   Patient Active Problem List   Diagnosis Date Noted  . Mood swings 11/08/2011    Priority: High  . Depression 01/06/2011    Priority: High  . SICKLE CELL ANEMIA 01/08/2009    Priority: High  . Anemia 10/05/2012    Priority: Medium  . 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 Fever, unspecified 10/06/2012  . Leukocytosis, unspecified 10/05/2012  . Sickle cell anemia with crisis   . Chronic back pain   . Leukocytosis 03/31/2012  . 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 Sickle cell pain crisis 03/29/2012  . Headache 02/13/2012  . Nausea & vomiting 02/13/2012  . Tachycardia 11/22/2011  . Right thigh pain 11/08/2011  . Migraines 11/08/2011  . Vaso-occlusive sickle cell crisis 10/08/2011  . Overweight 05/24/2011  . Stress 04/29/2011  . GERD (gastroesophageal reflux disease) 02/17/2011  . Back pain 09/17/2010  . Active smoker 08/09/2010  . 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     hydroxyurea 500 MG capsule  Commonly known as:  HYDREA  Take 2 capsules (1,000 mg total) by mouth at bedtime. May take with food to minimize GI side effects.     ibuprofen 600 MG tablet  Commonly known as:  ADVIL,MOTRIN  Take 1 tablet (600 mg total) by mouth every 6 (six) hours as needed for pain.     oxyCODONE 15 MG immediate release tablet  Commonly known as:  ROXICODONE  15 mg every am and pm q 16 hours PRN for pain (taken as q 8hrs PRN with 10 mg as second dose-medication being weaned).     oxyCODONE 5 MG immediate release tablet  Commonly known as:  Oxy IR/ROXICODONE  10 mg 1 x day as second  dose in q 8 hour dosing (medication being weaned).     polyethylene glycol packet  Commonly known as:  MIRALAX / GLYCOLAX  Take 17 g by mouth daily.     prenatal multivitamin Tabs  Take 1 tablet by mouth daily at 12 noon.     sertraline 50 MG tablet  Commonly known as:  ZOLOFT  Take 1 tablet (50 mg total) by mouth daily.          Consults:     SIGNIFICANT DIAGNOSTIC STUDIES:  Dg Chest 2 View  10/05/2012   *RADIOLOGY REPORT*  Clinical Data: Chest pain, shortness of breath, sickle cell crisis  CHEST - 2 VIEW  Comparison: 08/27/2012  Findings: Lung volumes are low with crowding of the bronchovascular markings.  Heart size mildly enlarged with central vascular congestion.  No focal pulmonary opacity.  No pleural effusion.  No acute osseous finding.  No significant change in fish mouth deformity of vertebral bodies typical for sickle cell disease. Cholecystectomy clips noted.  IMPRESSION: Low lung volumes with mild cardiomegaly and central vascular congestion.   Original Report Authenticated By: Christiana Pellant, M.D.   Ir Fluoro Guide Cv Line Right  10/10/2012   *RADIOLOGY REPORT*  Clinical Data: Sickle cell anemia with crisis, poor venous access; request is made for central venous access for fluids and medications.  PICC LINE PLACEMENT WITH ULTRASOUND AND FLUOROSCOPIC  GUIDANCE  Fluoroscopy Time: 0.2 minutes  The right arm was prepped with chlorhexidine, draped in the usual sterile fashion using maximum barrier technique (cap and mask, sterile gown, sterile gloves, large sterile sheet, hand hygiene and cutaneous antisepsis) and infiltrated locally with 1% Lidocaine.  Ultrasound demonstrated patency of the right basilic vein, and this was documented with an image.  Under real-time ultrasound guidance, this vein was accessed with a 21 gauge micropuncture needle and image documentation was performed.  The needle was exchanged over a guidewire for a peel-away sheath through which a five [place]  double lumen PICC trimmed to 37 cm was advanced, positioned with its tip at the lower SVC/right atrial junction.  Fluoroscopy during the procedure and fluoro spot radiograph confirms appropriate catheter position.  The catheter was flushed, secured to the skin with Prolene sutures, and covered with a sterile dressing.  Complications:  None  IMPRESSION: Successful right arm PICC line placement with ultrasound and fluoroscopic guidance.  The catheter is ready for use.  Read by: Jeananne Rama, P.A.-C  IR RIGHT FLOURO GUIDE CV LINE  Fluoroscopy Time: dictate in minutes & seconds  Comparison: None.  Findings:  IMPRESSION:   Original Report Authenticated By: D. Andria Rhein, MD   Ir US Guide Vasc Access Right  10/18/2012   *RADIOLOGY REPORT*  Clinical Data: Sickle cell anemia with crisis, poor venous access; request is made for central venous access for fluids and medications.  PICC LINE PLACEMENT WITH ULTRASOUND AND FLUOROSCOPIC  GUIDANCE  Fluoroscopy Time: 0.2 minutes  The right arm was prepped with chlorhexidine, draped in the usual sterile fashion using maximum barrier technique (cap and mask, sterile gown, sterile gloves, large sterile sheet, hand hygiene and cutaneous antisepsis) and infiltrated locally with 1% Lidocaine.  Ultrasound demonstrated patency of the right basilic vein, and this was documented with an image.  Under real-time ultrasound guidance, this vein was accessed with a 21 gauge micropuncture needle and image documentation was performed.  The needle was exchanged over a guidewire for a peel-away sheath through which a five [place] double lumen PICC trimmed to 37 cm was advanced, positioned with its tip at the lower SVC/right atrial junction.  Fluoroscopy during the procedure and fluoro spot radiograph confirms appropriate catheter position.  The catheter was flushed, secured to the skin with Prolene sutures, and covered with a sterile dressing.  Complications:  None  IMPRESSION: Successful right arm  PICC line placement with ultrasound and fluoroscopic guidance.  The catheter is ready for use.  Read by: Jeananne Rama, P.A.-C  IR RIGHT FLOURO GUIDE CV LINE  Fluoroscopy Time: dictate in minutes & seconds  Comparison: None.  Findings:  IMPRESSION:   Original Report Authenticated By: D. Andria Rhein, MD    No results found for this or any previous visit (from the past 240 hour(s)).  BRIEF ADMITTING H & P: Pt with Hb SS here with c/o pain in back and legs. Pain is characteristic of pain of VOC. No N/V/D/F/C. Pt feels that her crisis was triggered by weather and walking    Hospital Course:  Present on Admission:  [facility] where she was managed with IV analgesics. Pt's pain was still poorly controlled after several doses of analgesics and patient was unable to ambulate secondary to pain. Thus she is being transferred to the hospital for further symptom management to the goal of functional improvement.  Disposition and Follow-up:  Transfer  to [facility] hospital.      Future Appointments Provider Department Dept Phone   12/04/2012 3:00 PM Altha Harm, MD [facility] [phone]      DISCHARGE EXAM:  General: Alert, awake, oriented x3, in moderate distress.  Vital Signs:BP 124/71,HR 100, T 98.8 F (37.1 C), temperature source Oral, RR 18, height 5\' 3"  (1.6 m), weight 190 lb (86.183 kg), last menstrual period 10/28/2012, SpO2 96.00%. HEENT: [place]/AT PEERL, EOMI, anicteric  Neck: Trachea midline, no masses, no thyromegal,y no JVD, no carotid bruit  OROPHARYNX: Moist, No exudate/ erythema/lesions.  Heart: Regular rate and rhythm, without murmurs, rubs, gallops, PMI non-displaced, no heaves or thrills on palpation.  Lungs: Clear to auscultation, no wheezing or rhonchi noted.  Abdomen: Soft, nontender, nondistended, positive bowel sounds, no masses no hepatosplenomegaly noted.  Neuro: No focal neurological deficits noted .  Musculoskeletal: No warm  swelling or erythema around joints   No results found for this basename: NA, K, CL, CO2, GLUCOSE, BUN, CREATININE, CALCIUM, MG, PHOS,  in the last 72 hours No results found for this basename: AST, ALT, ALKPHOS, BILITOT, PROT, ALBUMIN,  in the last 72 hours No results found for this basename: LIPASE, AMYLASE,  in the last 72 hours No results found for this basename: WBC, NEUTROABS, HGB, HCT, MCV, PLT,  in the last 72 hours  Total time for D/C process was greater than 30 minutes.  Signed: MATTHEWS,MICHELLE A. 11/02/2012, 4:14 PM

## 2012-11-02 NOTE — H&P (Signed)
[facility] History and Physical  BLAISE PALLADINO ZOX:[id number] DOB: [birth year] DOA: 11/02/2012   PCP: Cleven Jansma A., MD   Chief Complaint: Pain in back and legs  HPI:  Pt well known to me is here with complaints of back and leg pain which she states started yesterday. She attributes there trigger to walking for exercise and the weather.  Review of Systems:  Constitutional:  No weight loss, night sweats, Fevers, chills, fatigue.  HEENT: No headaches, dizziness, seizures, vision changes, difficulty swallowing,Tooth/dental problems,Sore throat, No sneezing, itching, ear ache, nasal congestion, post nasal drip,  Cardio-vascular: No chest pain, Orthopnea, PND, swelling in lower extremities, anasarca, dizziness, palpitations  GI: No heartburn, indigestion, abdominal pain, nausea, vomiting, diarrhea, change in bowel habits, loss of appetite  Resp: No shortness of breath with exertion or at rest. No excess mucus,she does have a non-productive cough, No coughing up of blood.No change in color of mucus.No wheezing.No chest wall deformity  Skin: no rash or lesions.  GU: no dysuria, change in color of urine, no urgency or frequency. No flank pain.  Psych: No change in mood or affect. No depression or anxiety. No memory loss.    Past Medical History  Diagnosis Date  . H/O: 1 miscarriage 03/22/2011  . 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 Depression 01/06/2011  . GERD (gastroesophageal reflux disease) 02/17/2011  . Trichotillomania     h/o  . Blood transfusion     "lots"  . Sickle cell anemia with crisis   . Exertional dyspnea     "sometimes"  . Sickle cell anemia   . Headache(784.0)   . Migraines 11/08/11    "@ least twice/month"  . Chronic back pain     "very severe; have knot in my back; from tight muscle; take RX and exercise for it"  . Mood swings 11/08/11    "I go back and forth; real bad"  . Pregnancy   . Blood dyscrasia     [facility]   Past Surgical History   Procedure Laterality Date  . Cholecystectomy  05/2010  . Dilation and curettage of uterus  02/20/11    S/P miscarriage   Social History:  reports that she quit smoking about 16 months ago. She has never used smokeless tobacco. She reports that she does not drink alcohol or use illicit drugs.  Allergies  Allergen Reactions  . Carrot Oil Hives and Swelling  . Latex Rash    Family History  Problem Relation Age of Onset  . Diabetes Mother   . Alcoholism    . Depression    . Hypercholesterolemia    . Hypertension    . Migraines    . Diabetes Maternal Grandmother   . Diabetes Paternal Grandmother     Prior to Admission medications   Medication Sig Start Date End Date Taking? Authorizing Provider  folic acid (FOLVITE) 800 MCG tablet Take 800 mcg by mouth every morning.   Yes Historical Provider, MD  hydroxyurea (HYDREA) 500 MG capsule Take 2 capsules (1,000 mg total) by mouth at bedtime. May take with food to minimize GI side effects. 10/12/12  Yes Debby Crosley, MD  ibuprofen (ADVIL,MOTRIN) 600 MG tablet Take 1 tablet (600 mg total) by mouth every 6 (six) hours as needed for pain. 10/29/12  Yes Altha Harm, MD  oxyCODONE (OXY IR/ROXICODONE) 5 MG immediate release tablet 10 mg 1 x day as second dose in q 8 hour dosing (medication being weaned). 10/29/12  Yes Altha Harm, MD  oxyCODONE (ROXICODONE) 15 MG immediate release tablet 15 mg every am and pm q 16 hours PRN for pain (taken as q 8hrs PRN with 10 mg as second dose-medication being weaned). 10/29/12  Yes Altha Harm, MD  polyethylene glycol (MIRALAX / GLYCOLAX) packet Take 17 g by mouth daily. 10/12/12  Yes Debby Crosley, MD  Prenatal Vit-Fe Fumarate-FA (PRENATAL MULTIVITAMIN) TABS Take 1 tablet by mouth daily at 12 noon.   Yes Historical Provider, MD  sertraline (ZOLOFT) 50 MG tablet Take 1 tablet (50 mg total) by mouth daily. 10/15/12   Altha Harm, MD   Physical Exam: Filed Vitals:   11/02/12 1013  11/02/12 1317  BP: 117/81 122/78  Pulse: 85 85  Temp: 97.5 F (36.4 C) 98.8 F (37.1 C)  TempSrc: Oral Oral  Resp: 14 16  Height: 5\' 3"  (1.6 m)   Weight: 190 lb (86.183 kg)   SpO2: 97% 99%   BP 122/78  Pulse 85  Temp(Src) 98.8 F (37.1 C) (Oral)  Resp 16  Ht 5\' 3"  (1.6 m)  Wt 190 lb (86.183 kg)  BMI 33.67 kg/m2  SpO2 99%  LMP 10/28/2012  General Appearance:    Alert, cooperative, mild distress, appears stated age  Head:    Normocephalic, without obvious abnormality, atraumatic  Eyes:    PERRL, conjunctiva/corneas clear, EOM's intact, fundi    benign, both eyes , anicteric     Ears:    Normal TM's and external ear canals, both ears  Nose:   Nares normal, septum midline, mucosa normal, no drainage    or sinus tenderness  Throat:   Lips, mucosa, and tongue normal; teeth and gums normal  Neck:   Supple, symmetrical, trachea midline, no adenopathy;       thyroid:  No enlargement/tenderness/nodules; no carotid   bruit or JVD  Back:      No CVA tenderness  Lungs:     Clear to auscultation bilaterally, respirations unlabored  Heart:    Regular rate and rhythm, S1 and S2 normal, no murmur, rub   or gallop  Abdomen:     Soft, non-tender, bowel sounds active all four quadrants,    no masses, no organomegaly  Extremities:   Extremities normal, atraumatic, no cyanosis or edema  Pulses:   2+ and symmetric all extremities  Skin:   Skin color, texture, turgor normal, no rashes or lesions  Lymph nodes:   Cervical, supraclavicular, and axillary nodes normal  Neurologic:   CNII-XII intact. Normal strength, sensation and reflexes      throughout    Labs on Admission:  FBasic Metabolic Panel: )No results found for this basename: NA, K, CL, CO2, GLUCOSE, BUN, CREATININE, CALCIUM, MG, PHOS,  in the last 168 hours Liver Function Tests: No results found for this basename: AST, ALT, ALKPHOS, BILITOT, PROT, ALBUMIN,  in the last 168 hours CBC: No results found for this basename: WBC,  NEUTROABS, HGB, HCT, MCV, PLT,  in the last 168 hours BNP: No components found with this basename: POCBNP,    Radiological Exams on Admission: No results found.    Assessment/Plan: Active Problems: Hb SS with Crisis: Pt appears to be in early crisis. Will obtain labs (CBC wit diff, CMET, reticulocyte). Start aggresive hydration and analgesia. Once renal function is assessed, will consider Toradol for adjunctive therapy.   Time spend:25 minutes Code Status: Full Code Disposition Plan: Home today  Alexiz Cothran A., MD  [facility] Pager [phone]  If 7PM-7AM, please contact night-coverage [email or web address]  Password [email or web address] 11/02/2012, 1:18 PM

## 2012-11-03 LAB — CBC
HCT: 21.9 % — ABNORMAL LOW (ref 36.0–46.0)
MCH: 29.6 pg (ref 26.0–34.0)
MCHC: 34.7 g/dL (ref 30.0–36.0)
MCV: 85.2 fL (ref 78.0–100.0)
Platelets: 450 10*3/uL — ABNORMAL HIGH (ref 150–400)
RDW: 19 % — ABNORMAL HIGH (ref 11.5–15.5)

## 2012-11-03 MED ORDER — HYDROMORPHONE HCL PF 2 MG/ML IJ SOLN
2.0000 mg | INTRAMUSCULAR | Status: DC
  Administered 2012-11-03 (×3): 4 mg via INTRAVENOUS
  Administered 2012-11-03: 2 mg via INTRAVENOUS
  Administered 2012-11-03 – 2012-11-05 (×19): 4 mg via INTRAVENOUS
  Filled 2012-11-03 (×23): qty 2

## 2012-11-03 MED ORDER — HYDROMORPHONE HCL PF 1 MG/ML IJ SOLN
1.0000 mg | Freq: Once | INTRAMUSCULAR | Status: AC
  Administered 2012-11-03: 1 mg via INTRAVENOUS
  Filled 2012-11-03: qty 1

## 2012-11-03 MED ORDER — BACITRACIN-NEOMYCIN-POLYMYXIN 400-5-5000 EX OINT
TOPICAL_OINTMENT | CUTANEOUS | Status: AC
  Administered 2012-11-04: 1
  Filled 2012-11-03: qty 1

## 2012-11-03 NOTE — Progress Notes (Signed)
Subjective: A 22 yo woman admitted sickle cell painful crisis. Has been doing better but has a big bulge in her back over her right shoulder blade that is making her have pain. Her pain is now at 7/10. She has been able to get to the bathroom this morning but has nor mobilized outside her room. No SOB, No NVD.  Objective: Vital signs in last 24 hours: Temp:  [98 F (36.7 C)-98.8 F (37.1 C)] 98.3 F (36.8 C) (06/21 0449) Pulse Rate:  [85-115] 94 (06/21 0449) Resp:  [16-18] 16 (06/21 0449) BP: (115-138)/(53-94) 138/94 mmHg (06/21 0449) SpO2:  [94 %-100 %] 100 % (06/21 0449) Weight:  [85.73 kg (189 lb)-86.183 kg (190 lb)] 85.73 kg (189 lb) (06/21 0206) Weight change:  Last BM Date: 11/02/12  Intake/Output from previous day: 06/20 0701 - 06/21 0700 In: 2668.8 [P.O.:600; I.V.:2068.8] Out: 675 [Urine:675] Intake/Output this shift:    General appearance: alert, cooperative and no distress Eyes: conjunctivae/corneas clear. PERRL, EOM's intact. Fundi benign. Neck: no adenopathy, no carotid bruit, no JVD, supple, symmetrical, trachea midline and thyroid not enlarged, symmetric, no tenderness/mass/nodules Back: symmetric, no curvature. ROM normal. No CVA tenderness., a hard muscle bulge over right scapular noted Resp: clear to auscultation bilaterally Chest wall: no tenderness Cardio: regular rate and rhythm, S1, S2 normal, no murmur, click, rub or gallop GI: soft, non-tender; bowel sounds normal; no masses,  no organomegaly Extremities: extremities normal, atraumatic, no cyanosis or edema Pulses: 2+ and symmetric Skin: Skin color, texture, turgor normal. No rashes or lesions Neurologic: Grossly normal  Lab Results:  Recent Labs  11/03/12 0747  WBC 15.6*  HGB 7.6*  HCT 21.9*  PLT 450*   BMET No results found for this basename: NA, K, CL, CO2, GLUCOSE, BUN, CREATININE, CALCIUM,  in the last 72 hours  Studies/Results: No results found.  Medications: I have reviewed the  patient's current medications.  Assessment/Plan: A 22 yo with sickle cell painful crisis.  #1 sickle cell Painful crisis: will decrease her Dilaudid dose today and mobilize patient. If mobile in am will discharge home.  #2 Sickle cell anemia: H/H seems lower than 9.5 on 10/17/12. Will check in am with bilirubin to see if hemolyzing.   #3 Depression: Counseled, continue medications.   LOS: 1 day   Chanie Soucek,LAWAL 11/03/2012, 11:22 AM

## 2012-11-04 DIAGNOSIS — D5700 Hb-SS disease with crisis, unspecified: Secondary | ICD-10-CM

## 2012-11-04 LAB — COMPREHENSIVE METABOLIC PANEL
ALT: 17 U/L (ref 0–35)
Albumin: 4 g/dL (ref 3.5–5.2)
Alkaline Phosphatase: 62 U/L (ref 39–117)
Potassium: 3.8 mEq/L (ref 3.5–5.1)
Sodium: 136 mEq/L (ref 135–145)
Total Protein: 7.3 g/dL (ref 6.0–8.3)

## 2012-11-04 LAB — CBC WITH DIFFERENTIAL/PLATELET
Basophils Relative: 0 % (ref 0–1)
Eosinophils Absolute: 1.1 10*3/uL — ABNORMAL HIGH (ref 0.0–0.7)
Lymphs Abs: 3.2 10*3/uL (ref 0.7–4.0)
MCH: 30.2 pg (ref 26.0–34.0)
MCHC: 34.8 g/dL (ref 30.0–36.0)
Neutrophils Relative %: 75 % (ref 43–77)
Platelets: 474 10*3/uL — ABNORMAL HIGH (ref 150–400)
RBC: 2.58 MIL/uL — ABNORMAL LOW (ref 3.87–5.11)

## 2012-11-04 NOTE — Progress Notes (Signed)
Subjective: A 22 yo woman admitted sickle cell painful crisis. Patient is still complaining of 6-7/10 pain. She believes her pain is caused by the bulge in her back. There was also an erroneous lab result today indicating a big drop in her hemoglobin. She is eating and drinking, no SOB. More functional than when she first came to the hospital.  Objective: Vital signs in last 24 hours: Temp:  [98.2 F (36.8 C)-98.8 F (37.1 C)] 98.3 F (36.8 C) (06/22 0444) Pulse Rate:  [102-109] 102 (06/22 0444) Resp:  [16-18] 16 (06/22 0444) BP: (107-142)/(66-90) 120/71 mmHg (06/22 0444) SpO2:  [92 %-100 %] 92 % (06/22 0444) Weight:  [88.134 kg (194 lb 4.8 oz)] 88.134 kg (194 lb 4.8 oz) (06/22 0604) Weight change: 1.95 kg (4 lb 4.8 oz) Last BM Date: 11/02/12  Intake/Output from previous day: 06/21 0701 - 06/22 0700 In: 3382.3 [P.O.:480; I.V.:2902.3] Out: 1475 [Urine:1475] Intake/Output this shift:    General appearance: alert, cooperative and no distress Eyes: conjunctivae/corneas clear. PERRL, EOM's intact. Fundi benign. Neck: no adenopathy, no carotid bruit, no JVD, supple, symmetrical, trachea midline and thyroid not enlarged, symmetric, no tenderness/mass/nodules Back: symmetric, no curvature. ROM normal. No CVA tenderness., a hard muscle bulge over right scapular noted Resp: clear to auscultation bilaterally Chest wall: no tenderness Cardio: regular rate and rhythm, S1, S2 normal, no murmur, click, rub or gallop GI: soft, non-tender; bowel sounds normal; no masses,  no organomegaly Extremities: extremities normal, atraumatic, no cyanosis or edema Pulses: 2+ and symmetric Skin: Skin color, texture, turgor normal. No rashes or lesions Neurologic: Grossly normal  Lab Results:  Recent Labs  11/03/12 0747 11/04/12 0700  WBC 15.6* QUESTIONABLE RESULTS, RECOMMEND RECOLLECT TO VERIFY  HGB 7.6* QUESTIONABLE RESULTS, RECOMMEND RECOLLECT TO VERIFY  HCT 21.9* QUESTIONABLE RESULTS, RECOMMEND  RECOLLECT TO VERIFY  PLT 450* QUESTIONABLE RESULTS, RECOMMEND RECOLLECT TO VERIFY   BMET  Recent Labs  11/04/12 0700  NA QUESTIONABLE RESULTS, RECOMMEND RECOLLECT TO VERIFY  K QUESTIONABLE RESULTS, RECOMMEND RECOLLECT TO VERIFY  CL QUESTIONABLE RESULTS, RECOMMEND RECOLLECT TO VERIFY  CO2 QUESTIONABLE RESULTS, RECOMMEND RECOLLECT TO VERIFY  GLUCOSE QUESTIONABLE RESULTS, RECOMMEND RECOLLECT TO VERIFY  BUN QUESTIONABLE RESULTS, RECOMMEND RECOLLECT TO VERIFY  CREATININE QUESTIONABLE RESULTS, RECOMMEND RECOLLECT TO VERIFY  CALCIUM QUESTIONABLE RESULTS, RECOMMEND RECOLLECT TO VERIFY    Studies/Results: No results found.  Medications: I have reviewed the patient's current medications.  Assessment/Plan: A 22 yo with sickle cell painful crisis.  #1 sickle cell Painful crisis: More stable now. Will hold off on making further changes until her blood work is repeated.  #2 Sickle cell anemia: H/H was erroneously reported as very low. We will repeat it and if indeed low, will transfuse.  #3 Depression: Counseled, continue medications.   LOS: 2 days   Carmilla Granville,LAWAL 11/04/2012, 8:19 AM

## 2012-11-04 NOTE — Progress Notes (Signed)
CRITICAL VALUE ALERT  Critical value received:  Hgb - 6.3  Date of notification:  11/04/2012  Time of notification:  0715  Critical value read back: yes  Nurse who received alert:  Doretha Sou, RN  MD notified (1st page):  Mikeal Hawthorne, MD  Time of first page:  0728  MD notified (2nd page):  Time of second page:  Responding MD:   Time MD responded:

## 2012-11-05 DIAGNOSIS — D571 Sickle-cell disease without crisis: Secondary | ICD-10-CM

## 2012-11-05 MED ORDER — HYDROMORPHONE HCL 2 MG PO TABS
2.0000 mg | ORAL_TABLET | ORAL | Status: DC
  Filled 2012-11-05: qty 2

## 2012-11-05 MED ORDER — HYDROMORPHONE HCL 2 MG PO TABS
2.0000 mg | ORAL_TABLET | ORAL | Status: DC
  Administered 2012-11-05 (×3): 4 mg via ORAL
  Filled 2012-11-05 (×2): qty 2

## 2012-11-05 MED ORDER — OXYCODONE HCL 15 MG PO TABS: ORAL_TABLET | ORAL | Status: DC

## 2012-11-05 MED ORDER — OXYCODONE HCL 5 MG PO TABS: ORAL_TABLET | ORAL | Status: DC

## 2012-11-05 NOTE — Progress Notes (Signed)
Pt. Was discharged home. Pt. Was given her discharge instructions and prescriptions.

## 2012-11-05 NOTE — Progress Notes (Signed)
On 6/22 Lab reported to RN that labs drawn the morning of 6/22 were possibly diluted with saline and that initial critical Hgb of 6.3 that was called to RN was questionable.  Labs were re-drawn and Hgb was 7.8.  MD notified per Addison Naegeli, RN of new results and order for blood transfusion that was previously given was withdrawn.

## 2012-11-05 NOTE — Progress Notes (Signed)
RN found pt. To have removed Tegaderm dressing from IV and was using tape to make her own dressing over the IV site. RN emphasized to pt. The need to keep a clean IV site so that the area would not become infected. Pt. Stated that she understood but wanted to make her own dressing over the site because it felt better when she applied the dressing herself. RN will continue to monitor.

## 2012-11-05 NOTE — Progress Notes (Signed)
IV team was called to place new IV access.  IV team RN stated that after several attempts she was unable to place a new access.  MD was notified.  PER MD he stated that he would be by later today and was possibly going to discharge pt.  MD stated that IV access could be left out and dilaudid could be given PO.

## 2012-11-05 NOTE — Discharge Summary (Signed)
Physician Discharge Summary  Patient ID: [name] MRN: [id number] DOB/AGE: 04-Sep-1990 22 y.o.  Admit date: 11/02/2012 Discharge date: 11/05/2012  Admission Diagnoses:  Discharge Diagnoses:  Active Problems: Sickle Cell painful crisis with Anemia.   Discharged Condition: good  Hospital Course: Patient admitted with sickle cell painful crisis that was debilitating and making her non-functional. Has had IV dilaudid, hydration as well as supportive care. She has been Depressed and worries about her finances. Has been anxious at times. She has however, improved at the time of discharge and is more functional and able to move around. Has been discharged on her home regimen to [facility] for [facility] care.  Consults: None  Significant Diagnostic Studies: labs: Repeated CBC, CMP now at baseline at discharge.  Treatments: IV hydration and analgesia: Dilaudid, home medications.  Discharge Exam: Blood pressure 102/46, pulse 106, temperature 98.4 F (36.9 C), temperature source Oral, resp. rate 16, height 5\' 3"  (1.6 m), weight 87.091 kg (192 lb), last menstrual period 10/28/2012, SpO2 94.00%. General appearance: alert, cooperative and no distress Eyes: conjunctivae/corneas clear. PERRL, EOM's intact. Fundi benign. Back: symmetric, no curvature. ROM normal. No CVA tenderness. Resp: clear to auscultation bilaterally Chest wall: no tenderness Cardio: regular rate and rhythm, S1, S2 normal, no murmur, click, rub or gallop GI: soft, non-tender; bowel sounds normal; no masses,  no organomegaly Extremities: extremities normal, atraumatic, no cyanosis or edema Pulses: 2+ and symmetric Skin: Skin color, texture, turgor normal. No rashes or lesions Neurologic: Grossly normal  Disposition: 01-Home or Self Care   Future Appointments Provider Department Dept Phone   12/04/2012 3:00 PM Altha Harm, MD [facility] [phone]        Medication List    TAKE these medications       folic acid 800 MCG tablet  Commonly known as:  FOLVITE  Take 800 mcg by mouth every morning.     hydroxyurea 500 MG capsule  Commonly known as:  HYDREA  Take 2 capsules (1,000 mg total) by mouth at bedtime. May take with food to minimize GI side effects.     ibuprofen 600 MG tablet  Commonly known as:  ADVIL,MOTRIN  Take 1 tablet (600 mg total) by mouth every 6 (six) hours as needed for pain.     oxyCODONE 5 MG immediate release tablet  Commonly known as:  Oxy IR/ROXICODONE  10 mg 1 x day as second dose in q 8 hour dosing (medication being weaned).     oxyCODONE 15 MG immediate release tablet  Commonly known as:  ROXICODONE  15 mg every am and pm q 16 hours PRN for pain (taken as q 8hrs PRN with 10 mg as second dose-medication being weaned).     polyethylene glycol packet  Commonly known as:  MIRALAX / GLYCOLAX  Take 17 g by mouth daily.     prenatal multivitamin Tabs  Take 1 tablet by mouth daily at 12 noon.     sertraline 50 MG tablet  Commonly known as:  ZOLOFT  Take 1 tablet (50 mg total) by mouth daily.           Follow-up Information   Follow up with [facility]EMERGENCY DEPT. (As needed if symptoms worsen)    Contact information:   [address] [id number] [place] [address] [phone]      Signed: Lonia Blood 11/05/2012, 4:53 PM

## 2012-11-05 NOTE — Progress Notes (Signed)
RN entered pt. Room and found pt. Had removed IV dressing and tape stabilizing IV catheter.  Pt stated that she had removed dressing because the IV site was itching.  RN explained the importance of calling for assistance with IV's. IV was intact with blood return and a new dressing was applied.  RN paged IV team to seek assistance with placing another IV.

## 2012-11-08 LAB — TYPE AND SCREEN
DAT, IgG: NEGATIVE
Donor AG Type: NEGATIVE
Unit division: 0

## 2012-11-15 ENCOUNTER — Telehealth: Payer: Self-pay | Admitting: Internal Medicine

## 2012-11-15 DIAGNOSIS — G8929 Other chronic pain: Secondary | ICD-10-CM

## 2012-11-15 DIAGNOSIS — D571 Sickle-cell disease without crisis: Secondary | ICD-10-CM

## 2012-11-19 ENCOUNTER — Non-Acute Institutional Stay (HOSPITAL_COMMUNITY)
Admission: EM | Admit: 2012-11-19 | Discharge: 2012-11-19 | Disposition: A | Discharge status: Home / Self Care | Payer: Medicare Other | Attending: Emergency Medicine | Admitting: Emergency Medicine

## 2012-11-19 ENCOUNTER — Encounter (HOSPITAL_COMMUNITY): Payer: Self-pay | Admitting: *Deleted

## 2012-11-19 DIAGNOSIS — M545 Low back pain, unspecified: Secondary | ICD-10-CM | POA: Diagnosis not present

## 2012-11-19 DIAGNOSIS — R079 Chest pain, unspecified: Secondary | ICD-10-CM | POA: Insufficient documentation

## 2012-11-19 DIAGNOSIS — R52 Pain, unspecified: Secondary | ICD-10-CM | POA: Diagnosis not present

## 2012-11-19 DIAGNOSIS — D72829 Elevated white blood cell count, unspecified: Secondary | ICD-10-CM | POA: Diagnosis not present

## 2012-11-19 DIAGNOSIS — K5909 Other constipation: Secondary | ICD-10-CM | POA: Diagnosis not present

## 2012-11-19 DIAGNOSIS — M549 Dorsalgia, unspecified: Secondary | ICD-10-CM | POA: Diagnosis not present

## 2012-11-19 DIAGNOSIS — D5700 Hb-SS disease with crisis, unspecified: Secondary | ICD-10-CM | POA: Insufficient documentation

## 2012-11-19 DIAGNOSIS — R0602 Shortness of breath: Secondary | ICD-10-CM | POA: Diagnosis not present

## 2012-11-19 DIAGNOSIS — K219 Gastro-esophageal reflux disease without esophagitis: Secondary | ICD-10-CM | POA: Diagnosis not present

## 2012-11-19 DIAGNOSIS — E860 Dehydration: Secondary | ICD-10-CM | POA: Insufficient documentation

## 2012-11-19 DIAGNOSIS — F39 Unspecified mood [affective] disorder: Secondary | ICD-10-CM | POA: Diagnosis not present

## 2012-11-19 DIAGNOSIS — F32A Depression, unspecified: Secondary | ICD-10-CM

## 2012-11-19 DIAGNOSIS — F329 Major depressive disorder, single episode, unspecified: Secondary | ICD-10-CM

## 2012-11-19 DIAGNOSIS — Z79899 Other long term (current) drug therapy: Secondary | ICD-10-CM | POA: Diagnosis not present

## 2012-11-19 DIAGNOSIS — T4275XA Adverse effect of unspecified antiepileptic and sedative-hypnotic drugs, initial encounter: Secondary | ICD-10-CM | POA: Insufficient documentation

## 2012-11-19 DIAGNOSIS — G8929 Other chronic pain: Secondary | ICD-10-CM | POA: Insufficient documentation

## 2012-11-19 DIAGNOSIS — D571 Sickle-cell disease without crisis: Secondary | ICD-10-CM | POA: Diagnosis not present

## 2012-11-19 LAB — CBC WITH DIFFERENTIAL/PLATELET
Basophils Absolute: 0.1 10*3/uL (ref 0.0–0.1)
Eosinophils Relative: 3 % (ref 0–5)
Lymphocytes Relative: 24 % (ref 12–46)
MCV: 89.2 fL (ref 78.0–100.0)
Neutro Abs: 8.8 10*3/uL — ABNORMAL HIGH (ref 1.7–7.7)
Platelets: 532 10*3/uL — ABNORMAL HIGH (ref 150–400)
RDW: 18.5 % — ABNORMAL HIGH (ref 11.5–15.5)
WBC: 13.8 10*3/uL — ABNORMAL HIGH (ref 4.0–10.5)

## 2012-11-19 LAB — RETICULOCYTES
RBC.: 2.87 MIL/uL — ABNORMAL LOW (ref 3.87–5.11)
Retic Ct Pct: 11.4 % — ABNORMAL HIGH (ref 0.4–3.1)

## 2012-11-19 LAB — HEPATIC FUNCTION PANEL
ALT: 8 U/L (ref 0–35)
AST: 18 U/L (ref 0–37)
Albumin: 3.4 g/dL — ABNORMAL LOW (ref 3.5–5.2)
Alkaline Phosphatase: 51 U/L (ref 39–117)
Bilirubin, Direct: 0.3 mg/dL (ref 0.0–0.3)
Indirect Bilirubin: 1.4 mg/dL — ABNORMAL HIGH (ref 0.3–0.9)
Total Bilirubin: 1.7 mg/dL — ABNORMAL HIGH (ref 0.3–1.2)
Total Protein: 6.8 g/dL (ref 6.0–8.3)

## 2012-11-19 LAB — LACTATE DEHYDROGENASE: LDH: 263 U/L — ABNORMAL HIGH (ref 94–250)

## 2012-11-19 LAB — BASIC METABOLIC PANEL
CO2: 25 mEq/L (ref 19–32)
Calcium: 9.2 mg/dL (ref 8.4–10.5)
GFR calc Af Amer: >90 mL/min (ref >90)
Sodium: 136 mEq/L (ref 135–145)

## 2012-11-19 MED ORDER — FOLIC ACID 1 MG PO TABS
1.0000 mg | ORAL_TABLET | Freq: Every day | ORAL | Status: DC
  Administered 2012-11-19: 1 mg via ORAL
  Filled 2012-11-19: qty 1

## 2012-11-19 MED ORDER — HYDROXYUREA 500 MG PO CAPS: 1000.0000 mg | ORAL_CAPSULE | Freq: Every day | ORAL | Status: DC

## 2012-11-19 MED ORDER — SERTRALINE HCL 50 MG PO TABS
50.0000 mg | ORAL_TABLET | Freq: Every day | ORAL | Status: DC
  Administered 2012-11-19: 50 mg via ORAL
  Filled 2012-11-19: qty 1

## 2012-11-19 MED ORDER — IBUPROFEN 600 MG PO TABS: 600.0000 mg | ORAL_TABLET | Freq: Four times a day (QID) | ORAL | Status: DC | PRN

## 2012-11-19 MED ORDER — OXYCODONE HCL 5 MG PO TABS: ORAL_TABLET | ORAL | Status: DC

## 2012-11-19 MED ORDER — FOLIC ACID 800 MCG PO TABS: 800.0000 ug | ORAL_TABLET | Freq: Every morning | ORAL | Status: DC

## 2012-11-19 MED ORDER — HYDROMORPHONE HCL PF 2 MG/ML IJ SOLN
4.0000 mg | INTRAMUSCULAR | Status: DC
  Filled 2012-11-19: qty 2

## 2012-11-19 MED ORDER — HYDROMORPHONE HCL PF 1 MG/ML IJ SOLN
1.0000 mg | INTRAMUSCULAR | Status: AC
  Administered 2012-11-19: 1 mg via INTRAVENOUS
  Filled 2012-11-19: qty 1

## 2012-11-19 MED ORDER — HYDROMORPHONE HCL PF 2 MG/ML IJ SOLN
2.0000 mg | INTRAMUSCULAR | Status: DC | PRN
  Administered 2012-11-19: 3 mg via INTRAVENOUS
  Administered 2012-11-19 (×2): 2 mg via INTRAVENOUS
  Filled 2012-11-19: qty 2
  Filled 2012-11-19 (×2): qty 1

## 2012-11-19 MED ORDER — OXYCODONE HCL 15 MG PO TABS: ORAL_TABLET | ORAL | Status: DC

## 2012-11-19 MED ORDER — ONDANSETRON HCL 4 MG/2ML IJ SOLN
4.0000 mg | Freq: Once | INTRAMUSCULAR | Status: AC
  Administered 2012-11-19: 4 mg via INTRAVENOUS
  Filled 2012-11-19: qty 2

## 2012-11-19 MED ORDER — ONDANSETRON HCL 4 MG/2ML IJ SOLN
4.0000 mg | INTRAMUSCULAR | Status: DC | PRN
  Administered 2012-11-19: 4 mg via INTRAVENOUS
  Filled 2012-11-19: qty 2

## 2012-11-19 MED ORDER — DIPHENHYDRAMINE HCL 50 MG/ML IJ SOLN
12.5000 mg | INTRAMUSCULAR | Status: DC | PRN
  Administered 2012-11-19: 12.5 mg via INTRAVENOUS
  Filled 2012-11-19: qty 1

## 2012-11-19 MED ORDER — SODIUM CHLORIDE 0.9 % IV BOLUS (SEPSIS)
1000.0000 mL | Freq: Once | INTRAVENOUS | Status: AC
  Administered 2012-11-19: 1000 mL via INTRAVENOUS

## 2012-11-19 MED ORDER — HYDROMORPHONE HCL PF 1 MG/ML IJ SOLN
1.0000 mg | Freq: Once | INTRAMUSCULAR | Status: AC
  Administered 2012-11-19: 1 mg via INTRAVENOUS
  Filled 2012-11-19: qty 1

## 2012-11-19 MED ORDER — DEXTROSE-NACL 5-0.45 % IV SOLN
INTRAVENOUS | Status: DC
  Administered 2012-11-19: 13:00:00 via INTRAVENOUS

## 2012-11-19 MED ORDER — SORBITOL 70 % SOLN
30.0000 mL | Freq: Every day | Status: DC | PRN
  Filled 2012-11-19: qty 30

## 2012-11-19 MED ORDER — HYDROMORPHONE HCL PF 2 MG/ML IJ SOLN
1.0000 mg | INTRAMUSCULAR | Status: DC | PRN
  Administered 2012-11-19: 1 mg via INTRAVENOUS

## 2012-11-19 MED ORDER — ONDANSETRON HCL 4 MG PO TABS: 4.0000 mg | ORAL_TABLET | ORAL | Status: DC | PRN

## 2012-11-19 MED ORDER — DIPHENHYDRAMINE HCL 25 MG PO CAPS: 25.0000 mg | ORAL_CAPSULE | ORAL | Status: DC | PRN

## 2012-11-19 MED ORDER — KETOROLAC TROMETHAMINE 30 MG/ML IJ SOLN
30.0000 mg | Freq: Four times a day (QID) | INTRAMUSCULAR | Status: DC
  Administered 2012-11-19: 30 mg via INTRAVENOUS
  Filled 2012-11-19: qty 1

## 2012-11-19 MED ORDER — SENNOSIDES-DOCUSATE SODIUM 8.6-50 MG PO TABS
1.0000 | ORAL_TABLET | Freq: Two times a day (BID) | ORAL | Status: DC
  Administered 2012-11-19: 1 via ORAL
  Filled 2012-11-19: qty 1

## 2012-11-19 NOTE — Telephone Encounter (Signed)
[name] was seen in [facility] today and refill request where given at the time of d/c for oxycodone 5mg  and 15 mg last disp 10/26/12 a 15 day supply given at that time. Another 15 day supply written. Ibuprofen sent electronically to the pharmacy (600mg  Q 6hrs  Prn # 60). And Hydroxurea 1000 mg at bed time which she admits to being out of.for several months.

## 2012-11-19 NOTE — Discharge Summary (Signed)
[facility] Discharge Summary   Patient ID: [name] MRN: [id number] DOB/AGE: 21-12-1990 21 y.o.  Admit date: 11/19/2012 Discharge date: 11/19/2012  Primary Care Physician:  MATTHEWS,[place]henando[place] Yeats A., MD  Admission Diagnoses:  Active Problems: Vaso occlusive crisis  Mood disorder/depression Dehydration  Acute on Chronic pain  Discharge Diagnoses:   Vaso occlusive crisis  Mood disorder/depression Dehydration  Acute on Chronic pain  Discharge Medications:    Medication List    ASK your doctor about these medications       folic acid 800 MCG tablet  Commonly known as:  FOLVITE  Take 800 mcg by mouth every morning.     hydroxyurea 500 MG capsule  Commonly known as:  HYDREA  Take 2 capsules (1,000 mg total) by mouth at bedtime. May take with food to minimize GI side effects.     ibuprofen 600 MG tablet  Commonly known as:  ADVIL,MOTRIN  Take 1 tablet (600 mg total) by mouth every 6 (six) hours as needed for pain.     oxyCODONE 15 MG immediate release tablet  Commonly known as:  ROXICODONE  15 mg every am and pm q 16 hours PRN for pain (taken as q 8hrs PRN with 10 mg as second dose-medication being weaned).     oxyCODONE 5 MG immediate release tablet  Commonly known as:  Oxy IR/ROXICODONE  10 mg 1 x day as second dose in q 8 hour dosing (medication being weaned).     polyethylene glycol packet  Commonly known as:  MIRALAX / GLYCOLAX  Take 17 g by mouth daily.     sertraline 50 MG tablet  Commonly known as:  ZOLOFT  Take 1 tablet (50 mg total) by mouth daily.         Consults:   None   Significant Diagnostic Studies:  No results found.   [facility] Course:  For complete details please refer to admission H and P, but in brief, [name] is a 22 year old African American female with geno type SS sickle cell disease. PMH of depression, migraines and mood disorder. Patient present to ED this AM with c/o chest pain, rib pain  and low back pain. After evaluation she was medically stable to transfer to [facility] for further evaluation. [name] was arrestively treated for the following: 1.  vaso occlusive pain with IV analgesic hydromorphone 2-4 mg Q 2hrs prn, hydroxyurea restarted, prn antiemetics and antipruritics medication as indicated for nausea and itching respectively.   2. Leucocytosis: probable to reactive/inflammatory process secondary to SCD/crisis adjunct therapy used Toradol 30mg  Q 6hrs   3. Clinical dehydration: bolus IVF received in ED than proceeded with D51/2 NS @ 100cc/hr repleted.  4.Constipation No BM since 11/16/12 lactulose and senokot given  5. Depression/mood disorder: resumed Zoloft  At the time of discharge she became very tearful and refusing to leave. 6. Resisting therapeutic treatment: explained to pt she did not meet criteria for admission and we would continue to treat her painful crisis in the AM at the [facility] after she resumed her home medications and a refill for given for oxy ir 5mg  and 15 mg a 15 day supply) she initially verbalized understanding At the time of d/c she became very tearful crying stating cant walk and would not leave. Explained again she  does not meet admission criteria and reviewed and discussed labs and clinical evaluation. During periodic assessment pt was snoring and resting in no acute distress.  Nurse notified  security after pt refused to leave. Pt became agitated and she grab her clothes walked out of the facility with no functional decline.   Physical Exam at Discharge:  BP 111/69  Pulse 83  Temp(Src) 98.8 F (37.1 C) (Oral)  Resp 18  SpO2 100%  LMP 10/28/2012  Gen: crying and tearful,   Cardiovascular: Regular rate and rhythm, S1 and S2 normal, no murmur, rub or gallop  Respiratory:Clear to auscultation bilaterally, respirations unlabored No vocal fremitus  Gastrointestinal: Obese, Soft, non-tender, bowel sounds active all four quadrants Extremities:Extremities  normal, atraumatic, no cyanosis or edema  Pulses: 2+ and symmetric all extremities   Disposition at Discharge: 01-Home or Self Care  Discharge Orders:      Future Appointments Provider Department Dept Phone   12/04/2012 3:00 PM Altha Harm, MD [facility] [phone]      Condition at Discharge:   Stable  Time spent on Discharge:  Greater than 60 minutes.  Signed: Millisa Giarrusso P 11/19/2012, 7:17 PM

## 2012-11-19 NOTE — ED Notes (Signed)
Pt c/o sickle cell pain, chest and back and "sides" Pt states pain not controlled with rx'd meds.

## 2012-11-19 NOTE — Progress Notes (Signed)
Pt given discharge instructions and all items explained but she refused to sign discharge form; IV removed with no complications noted; all belongings returned to patient

## 2012-11-19 NOTE — Progress Notes (Signed)
Pt refuses to leave the center; [facility] Security called for patient transport; MD notified

## 2012-11-19 NOTE — ED Provider Notes (Signed)
History    CSN: [id number] Arrival date & time 11/19/12  0123  First MD Initiated Contact with Patient 11/19/12 0139     Chief Complaint  Patient presents with  . Sickle Cell Pain Crisis   (Consider location/radiation/quality/duration/timing/severity/associated sxs/prior Treatment) HPI Comments: Patient with pan crisis states bilateral rib pain  Taking normal meds without relief  Patient is a 22 y.o. female presenting with sickle cell pain. The history is provided by the patient.  Sickle Cell Pain Crisis Location:  Chest and back Severity:  Severe Onset quality:  Gradual Duration:  2 days Similar to previous crisis episodes: yes   Timing:  Constant Progression:  Worsening Chronicity:  Chronic Sickle cell genotype:  SS Date of last transfusion:  June 2014 History of pulmonary emboli: no   Context: not change in medication, not dehydration, not infection, not non-compliance, not pregnancy and not stress   Relieved by:  Nothing Worsened by:  Nothing tried Ineffective treatments:  Hydroxyurea, prescription drugs and rest Associated symptoms: chest pain and shortness of breath   Associated symptoms: no congestion, no cough, no fever, no nausea and no swelling of legs   Risk factors: frequent admissions for pain   Risk factors: no hx of pneumonia, no lack of social support, no history of acute chest syndrome, no recent air travel and not smoking    Past Medical History  Diagnosis Date  . H/O: 1 miscarriage 03/22/2011  . 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 Depression 01/06/2011  . GERD (gastroesophageal reflux disease) 02/17/2011  . Trichotillomania     h/o  . Blood transfusion     "lots"  . Sickle cell anemia with crisis   . Exertional dyspnea     "sometimes"  . Sickle cell anemia   . Headache(784.0)   . Migraines 11/08/11    "@ least twice/month"  . Chronic back pain     "very severe; have knot in my back; from tight muscle; take RX and exercise for it"  . Mood swings 11/08/11     "I go back and forth; real bad"  . Pregnancy   . Blood dyscrasia     SICKLE CELL   Past Surgical History  Procedure Laterality Date  . Cholecystectomy  05/2010  . Dilation and curettage of uterus  02/20/11    S/P miscarriage   Family History  Problem Relation Age of Onset  . Diabetes Mother   . Alcoholism    . Depression    . Hypercholesterolemia    . Hypertension    . Migraines    . Diabetes Maternal Grandmother   . Diabetes Paternal Grandmother    History  Substance Use Topics  . Smoking status: Former Smoker    Quit date: 06/17/2011  . Smokeless tobacco: Never Used  . Alcohol Use: No     Comment: pt states she quit in May 2013. She has been without marijuana, tobacco or ETOH for 1 year. She is enrolled in school   OB History   Grav Para Term Preterm Abortions TAB SAB Ect Mult Living   2    1  1         Obstetric Comments   Miscarried in October 2012 at about 7 weeks     Review of Systems  Constitutional: Negative for fever and activity change.  HENT: Negative for congestion.   Respiratory: Positive for shortness of breath. Negative for cough and chest tightness.   Cardiovascular: Positive for chest pain. Negative for palpitations and leg swelling.  Gastrointestinal: Negative for nausea.  Musculoskeletal: Positive for back pain.  All other systems reviewed and are negative.    Allergies  Carrot oil and Latex  Home Medications   Current Outpatient Rx  Name  Route  Sig  Dispense  Refill  . folic acid (FOLVITE) 800 MCG tablet   Oral   Take 800 mcg by mouth every morning.         . polyethylene glycol (MIRALAX / GLYCOLAX) packet   Oral   Take 17 g by mouth daily.   30 each   0   . hydroxyurea (HYDREA) 500 MG capsule   Oral   Take 2 capsules (1,000 mg total) by mouth at bedtime. May take with food to minimize GI side effects.   60 capsule   3   . ibuprofen (ADVIL,MOTRIN) 600 MG tablet   Oral   Take 1 tablet (600 mg total) by mouth every 6  (six) hours as needed for pain.   60 tablet   0   . oxyCODONE (OXY IR/ROXICODONE) 5 MG immediate release tablet      10 mg 1 x day as second dose in q 8 hour dosing (medication being weaned).   60 tablet   0   . oxyCODONE (ROXICODONE) 15 MG immediate release tablet      15 mg every am and pm q 16 hours PRN for pain (taken as q 8hrs PRN with 10 mg as second dose-medication being weaned).   60 tablet   0   . sertraline (ZOLOFT) 50 MG tablet   Oral   Take 1 tablet (50 mg total) by mouth daily.   30 tablet   3    BP 111/69  Pulse 83  Temp(Src) 98.8 F (37.1 C) (Oral)  Resp 18  SpO2 100%  LMP 10/28/2012 Physical Exam  Nursing note and vitals reviewed. Constitutional: She is oriented to person, place, and time. She appears well-developed and well-nourished.  HENT:  Head: Normocephalic.  Eyes: Pupils are equal, round, and reactive to light.  Neck: Normal range of motion.  Cardiovascular: Normal rate and regular rhythm.   Pulmonary/Chest: Effort normal and breath sounds normal.  Abdominal: Soft. Bowel sounds are normal.  Musculoskeletal: Normal range of motion.  Neurological: She is alert and oriented to person, place, and time.  Skin: Skin is warm and dry.    ED Course  Procedures (including critical care time) Labs Reviewed  CBC WITH DIFFERENTIAL - Abnormal; Notable for the following:    WBC 13.8 (*)    RBC 2.87 (*)    Hemoglobin 8.8 (*)    HCT 25.6 (*)    RDW 18.5 (*)    Platelets 532 (*)    Neutro Abs 8.8 (*)    Monocytes Absolute 1.2 (*)    All other components within normal limits  BASIC METABOLIC PANEL - Abnormal; Notable for the following:    BUN 5 (*)    All other components within normal limits  RETICULOCYTES - Abnormal; Notable for the following:    Retic Ct Pct 11.4 (*)    RBC. 2.87 (*)    Retic Count, Manual 327.2 (*)    All other components within normal limits  HEPATIC FUNCTION PANEL - Abnormal; Notable for the following:    Albumin 3.4 (*)     Total Bilirubin 1.7 (*)    Indirect Bilirubin 1.4 (*)    All other components within normal limits  LACTATE DEHYDROGENASE - Abnormal; Notable for the following:  LDH 263 (*)    All other components within normal limits   No results found. 1. Sickle cell anemia with pain    ED ECG REPORT   Date: 11/19/2012  EKG Time: 7:53 PM  Rate: 75  Rhythm: normal sinus rhythm,  unchanged from previous tracings  Axis: normal  Intervals:none  ST&T Change: none  Narrative Interpretation: normal           MDM  Will cntiue hydration and pain management   Arman Filter, NP 11/19/12 1953

## 2012-11-19 NOTE — ED Notes (Signed)
Yelled patient's name loudly 4 times before she aroused and then told the patient that she was being discharged and I needed to discontinue her IV. Patient told me no then she requested to see the physician. EDPA notified.

## 2012-11-19 NOTE — ED Provider Notes (Signed)
  Physical Exam  BP 117/55  Pulse 96  Temp(Src) 99.3 F (37.4 C) (Oral)  Resp 16  SpO2 99%  LMP 10/28/2012  Physical Exam  ED Course  Procedures Patient has been sleeping during her entire visit. The patient is stable and her retic count is stable. I have referred her back to her PCP.       Carlyle Dolly, PA-C 11/19/12 1014

## 2012-11-19 NOTE — ED Provider Notes (Signed)
Medical screening examination/treatment/procedure(s) were performed by non-physician practitioner and as supervising physician I was immediately available for consultation/collaboration.   Thea Holshouser H Jaceion Aday, MD 11/19/12 2254 

## 2012-11-19 NOTE — ED Provider Notes (Signed)
Medical screening examination/treatment/procedure(s) were performed by non-physician practitioner and as supervising physician I was immediately available for consultation/collaboration.   Richardean Canal, MD 11/19/12 2255

## 2012-11-19 NOTE — H&P (Signed)
[facility] History and Physical   Date: 11/19/2012  Patient name: [name] Medical record number: [id number] Date of birth: [birth year] Age: 22 y.o. Gender: female PCP: MATTHEWS,MICHELLE A., MD  Attending physician: Provider Default, MD  Chief Complaint: bilateral rib pain , lower back pain   History of Present Illness: [name] is a 22 year old African American female with geno type SS sickle cell disease. PMH of depression, migraines and mood disorder. Patient present to ED this AM with c/o chest pain, rib pain and low back pain. After evaluation she was medically stable to transfer to [facility] for further evaluation and treatment. [name] rates her pain 8/10 and her base line is 3/10. The pain in her back and ribs started hurting 11/16/12 and became worst after walking around down town. She was able to manage her pain at home until now. Provacative factors where and probable contributor to her present pain crisis; she has been actively excersing and walking and during her last walk she ran out of her water and became weak and tired. Palliative factors are rest and heat pads and fluids. She denies at this present chest pain, shortness of breath, n/v/d and dysuria.   Meds: Prescriptions prior to admission  Medication Sig Dispense Refill  . folic acid (FOLVITE) 800 MCG tablet Take 800 mcg by mouth every morning.      . hydroxyurea (HYDREA) 500 MG capsule Take 2 capsules (1,000 mg total) by mouth at bedtime. May take with food to minimize GI side effects.  30 capsule  0  . ibuprofen (ADVIL,MOTRIN) 600 MG tablet Take 1 tablet (600 mg total) by mouth every 6 (six) hours as needed for pain.  60 tablet  0  . oxyCODONE (OXY IR/ROXICODONE) 5 MG immediate release tablet 10 mg 1 x day as second dose in q 8 hour dosing (medication being weaned).  60 tablet  0  . oxyCODONE (ROXICODONE) 15 MG immediate release tablet 15 mg every am and pm q 16 hours PRN for pain (taken as q 8hrs PRN  with 10 mg as second dose-medication being weaned).  60 tablet  0  . polyethylene glycol (MIRALAX / GLYCOLAX) packet Take 17 g by mouth daily.  30 each  0  . sertraline (ZOLOFT) 50 MG tablet Take 1 tablet (50 mg total) by mouth daily.  30 tablet  3    Allergies: Carrot oil and Latex Past Medical History  Diagnosis Date  . H/O: 1 miscarriage 03/22/2011  . 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 Depression 01/06/2011  . GERD (gastroesophageal reflux disease) 02/17/2011  . Trichotillomania     h/o  . Blood transfusion     "lots"  . Sickle cell anemia with crisis   . Exertional dyspnea     "sometimes"  . Sickle cell anemia   . Headache(784.0)   . Migraines 11/08/11    "@ least twice/month"  . Chronic back pain     "very severe; have knot in my back; from tight muscle; take RX and exercise for it"  . Mood swings 11/08/11    "I go back and forth; real bad"  . Pregnancy   . Blood dyscrasia     SICKLE CELL   Past Surgical History  Procedure Laterality Date  . Cholecystectomy  05/2010  . Dilation and curettage of uterus  02/20/11    S/P miscarriage   Family History  Problem Relation Age of Onset  . Diabetes Mother   . Alcoholism    .  Depression    . Hypercholesterolemia    . Hypertension    . Migraines    . Diabetes Maternal Grandmother   . Diabetes Paternal Grandmother    History   Social History  . Marital Status: Single    Spouse Name: N/A    Number of Children: N/A  . Years of Education: N/A   Occupational History  . Not on file.   Social History Main Topics  . Smoking status: Former Smoker    Quit date: 06/17/2011  . Smokeless tobacco: Never Used  . Alcohol Use: No     Comment: pt states she quit in May 2013. She has been without marijuana, tobacco or ETOH for 1 year. She is enrolled in school  . Drug Use: No  . Sexually Active: Not Currently    Birth Control/ Protection: None   Other Topics Concern  . 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 rate 18, last menstrual period 10/28/2012, SpO2 99.00%. BP 106/64  Pulse 81  Temp(Src) 97.8 F (36.6 C) (Oral)  Resp 18  SpO2 99%  LMP 10/28/2012  General Appearance:    Alert, cooperative, no distress, appears stated age  Head:    Normocephalic, without obvious abnormality, atraumatic  Eyes:    PERRL, conjunctiva/corneas clear, EOM's intact  Ears:    Normal TM's and external ear canals, both ears  Nose:   Nares normal, septum midline, mucosa normal, no drainage    or sinus tenderness  Throat:   Lips, mucosa, and tongue dry ; teeth and gums normal  Neck:   Supple, symmetrical, trachea midline  Back:     Symmetric, no curvature, ROM normal, positive CVA tenderness  Lungs:     Clear to auscultation bilaterally, respirations unlabored  Chest Wall:    No tenderness or deformity   Heart:    Regular rate and rhythm, S1 and S2 normal, no murmur, rub   or gallop  Breast Exam:   deferred  Abdomen:     Obese, Soft, non-tender, bowel sounds active all four quadrants,    Genitalia:  Deferred  Rectal:  Deferred   Extremities:   Extremities normal, atraumatic, no cyanosis or edema  Pulses:   2+ and symmetric all extremities  Skin:   Skin color, texture, turgor normal, no rashes or lesions  Lymph nodes:   Cervical, supraclavicular, numerous tattoos   Neurologic:   CNII-XII intact, normal strength    Lab results: Results for orders placed during the hospital encounter of 11/19/12 (from the past 24 hour(s))  CBC WITH DIFFERENTIAL     Status: Abnormal   Collection Time    11/19/12  4:50 AM      Result Value Range   WBC 13.8 (*) 4.0 - 10.5 K/uL   RBC 2.87 (*) 3.87 -  5.11 MIL/uL   Hemoglobin 8.8 (*) 12.0 - 15.0 g/dL   HCT 62.1 (*) 30.8 - 65.7 %   MCV 89.2  78.0 - 100.0 fL   MCH 30.7  26.0 - 34.0 pg   MCHC 34.4  30.0 - 36.0 g/dL   RDW 84.6 (*) 96.2 -  15.5 %   Platelets 532 (*) 150 - 400 K/uL   Neutrophils Relative % 64  43 - 77 %   Neutro Abs 8.8 (*) 1.7 - 7.7 K/uL   Lymphocytes Relative 24  12 - 46 %   Lymphs Abs 3.3  0.7 - 4.0 K/uL   Monocytes Relative 9  3 - 12 %   Monocytes Absolute 1.2 (*) 0.1 - 1.0 K/uL   Eosinophils Relative 3  0 - 5 %   Eosinophils Absolute 0.4  0.0 - 0.7 K/uL   Basophils Relative 0  0 - 1 %   Basophils Absolute 0.1  0.0 - 0.1 K/uL  BASIC METABOLIC PANEL     Status: Abnormal   Collection Time    11/19/12  4:50 AM      Result Value Range   Sodium 136  135 - 145 mEq/L   Potassium 3.5  3.5 - 5.1 mEq/L   Chloride 103  96 - 112 mEq/L   CO2 25  19 - 32 mEq/L   Glucose, Bld 99  70 - 99 mg/dL   BUN 5 (*) 6 - 23 mg/dL   Creatinine, Ser 1.30  0.50 - 1.10 mg/dL   Calcium 9.2  8.4 - 86.5 mg/dL   GFR calc non Af Amer >90  >90 mL/min   GFR calc Af Amer >90  >90 mL/min  RETICULOCYTES     Status: Abnormal   Collection Time    11/19/12  4:50 AM      Result Value Range   Retic Ct Pct 11.4 (*) 0.4 - 3.1 %   RBC. 2.87 (*) 3.87 - 5.11 MIL/uL   Retic Count, Manual 327.2 (*) 19.0 - 186.0 K/uL  HEPATIC FUNCTION PANEL     Status: Abnormal   Collection Time    11/19/12 11:25 AM      Result Value Range   Total Protein 6.8  6.0 - 8.3 g/dL   Albumin 3.4 (*) 3.5 - 5.2 g/dL   AST 18  0 - 37 U/L   ALT 8  0 - 35 U/L   Alkaline Phosphatase 51  39 - 117 U/L   Total Bilirubin 1.7 (*) 0.3 - 1.2 mg/dL   Bilirubin, Direct 0.3  0.0 - 0.3 mg/dL   Indirect Bilirubin 1.4 (*) 0.3 - 0.9 mg/dL  LACTATE DEHYDROGENASE     Status: Abnormal   Collection Time    11/19/12 11:25 AM      Result Value Range   LDH 263 (*) 94 - 250 U/L    Imaging results:  No results found.   Assessment & Plan:   Vaso occlusive crisis/ Pain : Chronic likely caused  by hyperviscosity, sticky blood vessel and decreased oxygen carrying capacity. Treatment /plan is hydromorphone 2-4mg  Q 2hrs prn.Adjunction added Toradol 30mg  Q 6hrs, IV fluids for inflammatory/reactive process. Resume hydroxurea and folic acid  ( pt admits to being out of hydroxyurea for approx 3 month lost medication).  Dehydration: secondary for exhaustion and heat exposure replete fluids with D5 1/2 NS @ 100cc/hr 2 bolus given in ED prior to arrival. Constipation: secondary to narcotic use ordered lactulose and senokot S awaiting results  Depression/mood disorder: resume Zoloft home dose   EDWARDS, MICHELLE P 11/19/2012, 12:34 PM

## 2012-11-19 NOTE — Progress Notes (Signed)
[facility] asked patient to leave the [facility]; pt was offered a wheelchair transport but she grabbed her belongings and ambulated well to exit doors

## 2012-11-19 NOTE — Care Management (Signed)
[name] DOB :1990-11-08 MRN :[id number]  Date: 11/19/2012  Documentation Initiated by : Jefm Miles  Subjective/Objective Assessment: [name] is a 22 year old female with known SCD. She is being seen in the day hospital today for pain crisis. [name] is ivery positive regarding her future, she aspires to be a motivational speaker or a model. This CM processed with [name] on choices in her life. [name] is interested in speaking with a counseling. [name] stated she has made some positive changes in her life regarding her boyfriend and currently incorporates walking in her daily routine. [name] is very appreciative of this CM speaking with her today. [name] stated she really appreciates that Dr. Ashley Royalty listens to her. [name] stated she does have Medicare however she is unaware of he effective date. [name] stated her transportation issues have been some what resolved, however she is still saving to get her car fixed.      Barriers to Care: Questioning additional medical insurance coverage. Prior Approval (PA) #: NA PA start date:NA PA end date: NA    Action/Plan: This CM left a voicemail message for [facility] to see the possibility of a counselor coming by for a face to face. This CM will continue to monitor. This CM discovered that [name] has Medicare A& B coverage now.    Comments: [name] did not have any additional concerns at this time.  Time spent: 1 hour  Karoline Caldwell, RN, BSN, [place]     [phone]

## 2012-11-19 NOTE — Progress Notes (Signed)
Pt arrived from [facility] ED by wheelchair; pt alert and oriented; IV access noted in left upper shoulder with saline lock; MD notified of patient's arrival; will continue to monitor

## 2012-11-20 ENCOUNTER — Non-Acute Institutional Stay (HOSPITAL_COMMUNITY)
Admission: AD | Admit: 2012-11-20 | Discharge: 2012-11-20 | Disposition: A | Discharge status: Home / Self Care | Payer: Medicare Other | Attending: Internal Medicine | Admitting: Internal Medicine

## 2012-11-20 ENCOUNTER — Encounter (HOSPITAL_COMMUNITY): Payer: Self-pay | Admitting: *Deleted

## 2012-11-20 ENCOUNTER — Encounter (HOSPITAL_COMMUNITY): Payer: Self-pay | Admitting: Hematology

## 2012-11-20 ENCOUNTER — Emergency Department (HOSPITAL_COMMUNITY)
Admission: EM | Admit: 2012-11-20 | Discharge: 2012-11-20 | Disposition: A | Discharge status: Home / Self Care | Payer: Medicare Other | Attending: Emergency Medicine | Admitting: Emergency Medicine

## 2012-11-20 DIAGNOSIS — E860 Dehydration: Secondary | ICD-10-CM | POA: Diagnosis not present

## 2012-11-20 DIAGNOSIS — Z79899 Other long term (current) drug therapy: Secondary | ICD-10-CM | POA: Insufficient documentation

## 2012-11-20 DIAGNOSIS — G43909 Migraine, unspecified, not intractable, without status migrainosus: Secondary | ICD-10-CM | POA: Insufficient documentation

## 2012-11-20 DIAGNOSIS — M549 Dorsalgia, unspecified: Secondary | ICD-10-CM

## 2012-11-20 DIAGNOSIS — K219 Gastro-esophageal reflux disease without esophagitis: Secondary | ICD-10-CM | POA: Insufficient documentation

## 2012-11-20 DIAGNOSIS — Z8679 Personal history of other diseases of the circulatory system: Secondary | ICD-10-CM | POA: Insufficient documentation

## 2012-11-20 DIAGNOSIS — D5700 Hb-SS disease with crisis, unspecified: Secondary | ICD-10-CM | POA: Insufficient documentation

## 2012-11-20 DIAGNOSIS — Z87891 Personal history of nicotine dependence: Secondary | ICD-10-CM | POA: Insufficient documentation

## 2012-11-20 DIAGNOSIS — Z8659 Personal history of other mental and behavioral disorders: Secondary | ICD-10-CM | POA: Diagnosis not present

## 2012-11-20 DIAGNOSIS — F329 Major depressive disorder, single episode, unspecified: Secondary | ICD-10-CM | POA: Diagnosis not present

## 2012-11-20 DIAGNOSIS — D571 Sickle-cell disease without crisis: Secondary | ICD-10-CM | POA: Diagnosis not present

## 2012-11-20 DIAGNOSIS — F6389 Other impulse disorders: Secondary | ICD-10-CM | POA: Diagnosis not present

## 2012-11-20 DIAGNOSIS — Z91040 Latex allergy status: Secondary | ICD-10-CM | POA: Insufficient documentation

## 2012-11-20 DIAGNOSIS — Z8742 Personal history of other diseases of the female genital tract: Secondary | ICD-10-CM | POA: Diagnosis not present

## 2012-11-20 DIAGNOSIS — Z862 Personal history of diseases of the blood and blood-forming organs and certain disorders involving the immune mechanism: Secondary | ICD-10-CM | POA: Diagnosis not present

## 2012-11-20 DIAGNOSIS — F39 Unspecified mood [affective] disorder: Secondary | ICD-10-CM | POA: Insufficient documentation

## 2012-11-20 DIAGNOSIS — Z91018 Allergy to other foods: Secondary | ICD-10-CM | POA: Insufficient documentation

## 2012-11-20 DIAGNOSIS — F3289 Other specified depressive episodes: Secondary | ICD-10-CM | POA: Insufficient documentation

## 2012-11-20 DIAGNOSIS — D72829 Elevated white blood cell count, unspecified: Secondary | ICD-10-CM | POA: Insufficient documentation

## 2012-11-20 DIAGNOSIS — E669 Obesity, unspecified: Secondary | ICD-10-CM | POA: Insufficient documentation

## 2012-11-20 DIAGNOSIS — G8929 Other chronic pain: Secondary | ICD-10-CM

## 2012-11-20 DIAGNOSIS — K5900 Constipation, unspecified: Secondary | ICD-10-CM | POA: Insufficient documentation

## 2012-11-20 DIAGNOSIS — Z8719 Personal history of other diseases of the digestive system: Secondary | ICD-10-CM | POA: Insufficient documentation

## 2012-11-20 LAB — RETICULOCYTES
RBC.: 2.83 MIL/uL — ABNORMAL LOW (ref 3.87–5.11)
Retic Count, Absolute: 232.1 10*3/uL — ABNORMAL HIGH (ref 19.0–186.0)

## 2012-11-20 LAB — CBC WITH DIFFERENTIAL/PLATELET
Basophils Absolute: 0 10*3/uL (ref 0.0–0.1)
Basophils Relative: 0 % (ref 0–1)
Hemoglobin: 8.8 g/dL — ABNORMAL LOW (ref 12.0–15.0)
Lymphocytes Relative: 9 % — ABNORMAL LOW (ref 12–46)
MCHC: 35.1 g/dL (ref 30.0–36.0)
Neutro Abs: 10.6 10*3/uL — ABNORMAL HIGH (ref 1.7–7.7)
Neutrophils Relative %: 85 % — ABNORMAL HIGH (ref 43–77)
RDW: 17.8 % — ABNORMAL HIGH (ref 11.5–15.5)
WBC: 12.4 10*3/uL — ABNORMAL HIGH (ref 4.0–10.5)

## 2012-11-20 LAB — POCT I-STAT, CHEM 8
Creatinine, Ser: 0.6 mg/dL (ref 0.50–1.10)
HCT: 32 % — ABNORMAL LOW (ref 36.0–46.0)
Hemoglobin: 10.9 g/dL — ABNORMAL LOW (ref 12.0–15.0)
Potassium: 3.8 mEq/L (ref 3.5–5.1)
Sodium: 140 mEq/L (ref 135–145)

## 2012-11-20 LAB — HEPATIC FUNCTION PANEL
Bilirubin, Direct: 0.4 mg/dL — ABNORMAL HIGH (ref 0.0–0.3)
Indirect Bilirubin: 2.4 mg/dL — ABNORMAL HIGH (ref 0.3–0.9)

## 2012-11-20 MED ORDER — ONDANSETRON HCL 4 MG PO TABS: 4.0000 mg | ORAL_TABLET | ORAL | Status: DC | PRN

## 2012-11-20 MED ORDER — KETOROLAC TROMETHAMINE 30 MG/ML IJ SOLN
30.0000 mg | Freq: Four times a day (QID) | INTRAMUSCULAR | Status: DC
  Administered 2012-11-20: 30 mg via INTRAVENOUS
  Filled 2012-11-20: qty 1

## 2012-11-20 MED ORDER — ONDANSETRON HCL 4 MG/2ML IJ SOLN
4.0000 mg | Freq: Once | INTRAMUSCULAR | Status: AC
  Administered 2012-11-20: 4 mg via INTRAVENOUS
  Filled 2012-11-20: qty 2

## 2012-11-20 MED ORDER — HYDROMORPHONE HCL PF 2 MG/ML IJ SOLN
2.0000 mg | INTRAMUSCULAR | Status: DC | PRN
  Administered 2012-11-20: 2 mg via INTRAVENOUS
  Filled 2012-11-20: qty 1

## 2012-11-20 MED ORDER — HYDROMORPHONE HCL PF 1 MG/ML IJ SOLN
1.0000 mg | Freq: Once | INTRAMUSCULAR | Status: AC
  Administered 2012-11-20: 1 mg via INTRAVENOUS
  Filled 2012-11-20: qty 1

## 2012-11-20 MED ORDER — ONDANSETRON HCL 4 MG/2ML IJ SOLN: 4.0000 mg | INTRAMUSCULAR | Status: DC | PRN

## 2012-11-20 MED ORDER — FOLIC ACID 1 MG PO TABS
1.0000 mg | ORAL_TABLET | Freq: Every day | ORAL | Status: DC
  Administered 2012-11-20: 1 mg via ORAL
  Filled 2012-11-20: qty 1

## 2012-11-20 MED ORDER — DEXTROSE-NACL 5-0.45 % IV SOLN
INTRAVENOUS | Status: DC
  Administered 2012-11-20: 10:00:00 via INTRAVENOUS

## 2012-11-20 MED ORDER — HYDROMORPHONE HCL PF 2 MG/ML IJ SOLN
4.0000 mg | INTRAMUSCULAR | Status: AC
  Administered 2012-11-20 (×3): 4 mg via INTRAVENOUS
  Filled 2012-11-20 (×3): qty 2

## 2012-11-20 MED ORDER — SERTRALINE HCL 50 MG PO TABS
50.0000 mg | ORAL_TABLET | Freq: Every day | ORAL | Status: DC
  Administered 2012-11-20: 50 mg via ORAL
  Filled 2012-11-20: qty 1

## 2012-11-20 MED ORDER — HYDROXYUREA 500 MG PO CAPS
1000.0000 mg | ORAL_CAPSULE | Freq: Every day | ORAL | Status: DC
  Filled 2012-11-20: qty 2

## 2012-11-20 NOTE — Progress Notes (Signed)
Patient ID: [name], female   DOB: 09/09/1990, 22 y.o.   MRN: [id number] Discharge instructions given to patient, all questions answered; pt alert and oriented; discharged to home; no complications noted

## 2012-11-20 NOTE — ED Provider Notes (Signed)
History    CSN: [id number] Arrival date & time 11/20/12  0603  First MD Initiated Contact with Patient 11/20/12 [id number]     Chief Complaint  Patient presents with  . Sickle Cell Pain Crisis   (Consider location/radiation/quality/duration/timing/severity/associated sxs/prior Treatment) HPI  [name] is a 22 y.o. female with past medical history significant for sickle cell disease complaining of back and right side pain worsening over the night. Patient rates her pain at 10 out of 10, no exacerbating or alleviating factors identified. Pt reports several episodes of NBNB emesis, now tolerating PO. Patient denies fever, cough, shortness of breath, change in bowel or bladder habits.   Past Medical History  Diagnosis Date  . H/O: 1 miscarriage 03/22/2011  . 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 Depression 01/06/2011  . GERD (gastroesophageal reflux disease) 02/17/2011  . Trichotillomania     h/o  . Blood transfusion     "lots"  . Sickle cell anemia with crisis   . Exertional dyspnea     "sometimes"  . Sickle cell anemia   . Headache(784.0)   . Migraines 11/08/11    "@ least twice/month"  . Chronic back pain     "very severe; have knot in my back; from tight muscle; take RX and exercise for it"  . Mood swings 11/08/11    "I go back and forth; real bad"  . Pregnancy   . Blood dyscrasia     SICKLE CELL   Past Surgical History  Procedure Laterality Date  . Cholecystectomy  05/2010  . Dilation and curettage of uterus  02/20/11    S/P miscarriage   Family History  Problem Relation Age of Onset  . Diabetes Mother   . Alcoholism    . Depression    . Hypercholesterolemia    . Hypertension    . Migraines    . Diabetes Maternal Grandmother   . Diabetes Paternal Grandmother    History  Substance Use Topics  . Smoking status: Former Smoker    Quit date: 06/17/2011  . Smokeless tobacco: Never Used  . Alcohol Use: No     Comment: pt states she quit in May 2013. She has been without  marijuana, tobacco or ETOH for 1 year. She is enrolled in school   OB History   Grav Para Term Preterm Abortions TAB SAB Ect Mult Living   2    1  1         Obstetric Comments   Miscarried in October 2012 at about 7 weeks     Review of Systems  Constitutional:       Negative except as described in HPI  HENT:       Negative except as described in HPI  Respiratory:       Negative except as described in HPI  Cardiovascular:       Negative except as described in HPI  Gastrointestinal:       Negative except as described in HPI  Genitourinary:       Negative except as described in HPI  Musculoskeletal:       Negative except as described in HPI  Skin:       Negative except as described in HPI  Neurological:       Negative except as described in HPI  All other systems reviewed and are negative.    Allergies  Carrot oil and Latex  Home Medications   Current Outpatient Rx  Name  Route  Sig  Dispense  Refill  . folic acid (FOLVITE) 800 MCG tablet   Oral   Take 800 mcg by mouth every morning.         . hydroxyurea (HYDREA) 500 MG capsule   Oral   Take 2 capsules (1,000 mg total) by mouth at bedtime. May take with food to minimize GI side effects.   60 capsule   3   . ibuprofen (ADVIL,MOTRIN) 600 MG tablet   Oral   Take 1 tablet (600 mg total) by mouth every 6 (six) hours as needed for pain.   60 tablet   0   . oxyCODONE (OXY IR/ROXICODONE) 5 MG immediate release tablet      10 mg 1 x day as second dose in q 8 hour dosing (medication being weaned).   60 tablet   0   . oxyCODONE (ROXICODONE) 15 MG immediate release tablet      15 mg every am and pm q 16 hours PRN for pain (taken as q 8hrs PRN with 10 mg as second dose-medication being weaned).   60 tablet   0   . polyethylene glycol (MIRALAX / GLYCOLAX) packet   Oral   Take 17 g by mouth daily.   30 each   0   . sertraline (ZOLOFT) 50 MG tablet   Oral   Take 1 tablet (50 mg total) by mouth daily.   30  tablet   3    BP 128/46  Pulse 117  Temp(Src) 99 F (37.2 C) (Oral)  Resp 26  Wt 185 lb (83.915 kg)  BMI 32.78 kg/m2  SpO2 100%  LMP 10/28/2012 Physical Exam  Nursing note and vitals reviewed. Constitutional: She is oriented to person, place, and time. She appears well-developed and well-nourished. No distress.  HENT:  Head: Normocephalic and atraumatic.  Mouth/Throat: Oropharynx is clear and moist.  Eyes: Conjunctivae and EOM are normal. Pupils are equal, round, and reactive to light.  Neck: Normal range of motion. Neck supple.  Cardiovascular: Normal rate, regular rhythm, normal heart sounds and intact distal pulses.   Pulmonary/Chest: Effort normal and breath sounds normal. No stridor. No respiratory distress. She has no wheezes. She has no rales. She exhibits no tenderness.  Abdominal: Soft. Bowel sounds are normal. She exhibits no distension and no mass. There is no tenderness. There is no rebound and no guarding.  Musculoskeletal: Normal range of motion. She exhibits no edema.  Neurological: She is alert and oriented to person, place, and time.  Psychiatric: She has a normal mood and affect.    ED Course  Procedures (including critical care time) Labs Reviewed - No data to display No results found. 1. Sickle-cell disease with pain     MDM   Filed Vitals:   11/20/12 0609 11/20/12 0612 11/20/12 0638  BP:  128/46 114/74  Pulse: 98 117 100  Temp:  99 F (37.2 C) 99.5 F (37.5 C)  TempSrc:  Oral Oral  Resp: 28 26 18   Weight: 185 lb (83.915 kg)    SpO2: 95% 100% 97%     [name] is a 22 y.o. female with exacerbation of typical sickle cell pain crisis. Patient's vital signs are stable, no concern for acute chest. I-STAT Chem-8 is grossly within normal limits. Patient will be discharged to follow with sickle cell clinic when it opened. She has been given Dilaudid and Zofran IV.  Medications  ondansetron (ZOFRAN) injection 4 mg (4 mg Intravenous Given  11/20/12 0634)  HYDROmorphone (DILAUDID) injection 1  mg (1 mg Intravenous Given 11/20/12 0634)    Pt is hemodynamically stable, appropriate for, and amenable to discharge at this time. Pt verbalized understanding and agrees with care plan. Outpatient follow-up and specific return precautions discussed.     Nancy Emery, PA-C 11/20/12 1145

## 2012-11-20 NOTE — Discharge Summary (Signed)
[facility] Discharge Summary   Patient ID: [name] MRN: [id number] DOB/AGE: January 10, 1991 22 y.o.  Admit date: 11/20/2012 Discharge date: 11/20/2012  Primary Care Physician:  MATTHEWS,Zaydn Gutridge A., MD  Admission Diagnoses:  Active Problems: Vaso occlusive crisis  Mood disorder/depression  Dehydration  Acute on Chronic pain  Discharge Diagnoses:    Vaso occlusive crisis  Mood disorder/depression  Dehydration  Acute on Chronic pain   Discharge Medications:    Medication List    ASK your doctor about these medications       folic acid 800 MCG tablet  Commonly known as:  FOLVITE  Take 800 mcg by mouth every morning.     hydroxyurea 500 MG capsule  Commonly known as:  HYDREA  Take 2 capsules (1,000 mg total) by mouth at bedtime. May take with food to minimize GI side effects.     ibuprofen 600 MG tablet  Commonly known as:  ADVIL,MOTRIN  Take 1 tablet (600 mg total) by mouth every 6 (six) hours as needed for pain.     oxyCODONE 15 MG immediate release tablet  Commonly known as:  ROXICODONE  15 mg every am and pm q 16 hours PRN for pain (taken as q 8hrs PRN with 10 mg as second dose-medication being weaned).     oxyCODONE 5 MG immediate release tablet  Commonly known as:  Oxy IR/ROXICODONE  10 mg 1 x day as second dose in q 8 hour dosing (medication being weaned).     polyethylene glycol packet  Commonly known as:  MIRALAX / GLYCOLAX  Take 17 g by mouth daily.     sertraline 50 MG tablet  Commonly known as:  ZOLOFT  Take 1 tablet (50 mg total) by mouth daily.         Consults:   None   Significant Diagnostic Studies:  No results found.   [facility] Course:  For complete details please refer to admission H and P, but in brief, [name] is a 22 year old African American female with geno type SS sickle cell disease. PMH of depression, migraines and mood disorder. Patient present to ED this AM with c/o episodic n/v  denies diarrhea and her pain continued in her back, lest and chest . She was d/c from ED and asked to f/u with the [facility]. She was admitted to manage her vaso occlusive crisis.  Ms. Sedlack was arrestively treated for the following:  1. vaso occlusive pain with mild hemolysis: she was treated with   IV analgesic hydromorphone 4 mg Q 2hrs scheduled for 3 doses than 2-4 mg Q2hrs prn for pain, prn antiemetics and antipruritics medication as indicated for nausea and itching. (IV site nfiltrated and pt did not want to be stuck again. ) 2. Leucocytosis:  Trending downward . This is probable a reactive/inflammatory process secondary to SCD/crisis adjunct therapy used Toradol 30mg  Q 6hrs. There is no s/s of infection at this time 3. Clinical dehydration:  repleted  with D51/2 NS @ 125 cc/hr.  4.Constipation Large BM today (feels better)  5. Depression/mood disorder: resumed Zoloft  6. Tachycardia: probable secondary to anemia. 11/19/12 EKG SINUS RHYTHM ~ normal P axis, V-rate 50- 99  Physical Exam at Discharge:  BP 118/61  Pulse 100  Temp(Src) 99.1 F (37.3 C) (Oral)  Resp 13  SpO2 100%  LMP 10/28/2012  Breastfeeding? No  WUJ:WJXBJ, very cooperative, no distress,  Cardiovascular:Regular rate and rhythm, S1 and S2 normal, no murmur, rub  or gallop (tacycardia at times > 100)  Respiratory:Clear to auscultation bilaterally, respirations unlabored , no vocal fremitus  Gastrointestinal: obese ,Soft, non-tender, bowel sounds active all four quadrants,  Extremities:Extremities normal, atraumatic, no cyanosis or edema  Pulses:  2+ and symmetric all extremities   Disposition at Discharge: 01-Home or Self Care  Discharge Orders:      Future Appointments Provider Department Dept Phone   12/04/2012 3:00 PM Altha Harm, MD [facility] [place] [facility] [phone]      Condition at Discharge:   Stable  Time spent on Discharge:  Greater than 30 minutes.  Signed: Jhoana Upham P 11/20/2012,  4:04 PM

## 2012-11-20 NOTE — H&P (Signed)
[facility] History and Physical  [name] OZH:[id number] DOB: [birth year] DOA: 11/20/2012   PCP: Ronya Gilcrest A., MD   Chief Complaint: Continued Pain in Back, Legs and Chest.   HPI:Pt  With Hb SS who was seen in the [facility] yesterday after triage through the ED. Pt by my evaluation was appropriately triaged and treated in the day hospital. She was discharged home last night and returned to the ED this morning stating that she also had 3 episodes of emesis which are now resolved. She was treated with IVF, IV Dilaudid and Zofran. Pt then presented to the day hospital stating that her pain was still not well controlled and that she felt dehydrated.   Review of Systems:  Constitutional: No weight loss, night sweats, Fevers, chills, fatigue.  HEENT: No headaches, dizziness, seizures, vision changes, difficulty swallowing,Tooth/dental problems,Sore throat, No sneezing, itching, ear ache, nasal congestion, post nasal drip,  Cardio-vascular: No chest pain, Orthopnea, PND, swelling in lower extremities, anasarca, dizziness, palpitations  GI: No heartburn, indigestion, abdominal pain, nausea, diarrhea, change in bowel habits. She reports 3 episodes of vomiting and states that last meal was yesterday. Resp: No shortness of breath with exertion or at rest. No excess mucus, no productive cough, No non-productive cough, No coughing up of blood.No change in color of mucus.No wheezing.No chest wall deformity  Skin: no rash or lesions.  GU: no dysuria, change in color of urine, no urgency or frequency. No flank pain.  Musculoskeletal: No joint pain or swelling. No decreased range of motion. No back pain.  Psych: No change in mood or affect. No depression or anxiety. No memory loss.    Past Medical History  Diagnosis Date  . H/O: 1 miscarriage 03/22/2011  . 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 Depression 01/06/2011  . GERD (gastroesophageal reflux disease) 02/17/2011  .  Trichotillomania     h/o  . Blood transfusion     "lots"  . Sickle cell anemia with crisis   . Exertional dyspnea     "sometimes"  . Sickle cell anemia   . Headache(784.0)   . Migraines 11/08/11    "@ least twice/month"  . Chronic back pain     "very severe; have knot in my back; from tight muscle; take RX and exercise for it"  . Mood swings 11/08/11    "I go back and forth; real bad"  . Pregnancy   . Blood dyscrasia     SICKLE CELL   Past Surgical History  Procedure Laterality Date  . Cholecystectomy  05/2010  . Dilation and curettage of uterus  02/20/11    S/P miscarriage   Social History:  reports that she quit smoking about 17 months ago. She has never used smokeless tobacco. She reports that she does not drink alcohol or use illicit drugs.  Allergies  Allergen Reactions  . Carrot Oil Hives and Swelling  . Latex Rash    Family History  Problem Relation Age of Onset  . Diabetes Mother   . Alcoholism    . Depression    . Hypercholesterolemia    . Hypertension    . Migraines    . Diabetes Maternal Grandmother   . Diabetes Paternal Grandmother     Prior to Admission medications   Medication Sig Start Date End Date Taking? Authorizing Provider  folic acid (FOLVITE) 800 MCG tablet Take 800 mcg by mouth every morning.   Yes Historical Provider, MD  hydroxyurea (HYDREA) 500 MG capsule Take 2 capsules (1,000  mg total) by mouth at bedtime. May take with food to minimize GI side effects. 11/19/12  Yes Grayce Sessions, NP  ibuprofen (ADVIL,MOTRIN) 600 MG tablet Take 1 tablet (600 mg total) by mouth every 6 (six) hours as needed for pain. 11/19/12  Yes Grayce Sessions, NP  oxyCODONE (OXY IR/ROXICODONE) 5 MG immediate release tablet 10 mg 1 x day as second dose in q 8 hour dosing (medication being weaned). 11/19/12  Yes Grayce Sessions, NP  oxyCODONE (ROXICODONE) 15 MG immediate release tablet 15 mg every am and pm q 16 hours PRN for pain (taken as q 8hrs PRN with 10 mg as  second dose-medication being weaned). 11/19/12  Yes Grayce Sessions, NP  polyethylene glycol (MIRALAX / GLYCOLAX) packet Take 17 g by mouth daily. 10/12/12  Yes Debby Crosley, MD  sertraline (ZOLOFT) 50 MG tablet Take 1 tablet (50 mg total) by mouth daily. 10/15/12  Yes Altha Harm, MD   Physical Exam: Filed Vitals:   11/20/12 0909  BP: 122/70  Pulse: 90  Temp: 99.3 F (37.4 C)  TempSrc: Oral  Resp: 18  SpO2: 100%   BP 122/70  Pulse 90  Temp(Src) 99.3 F (37.4 C) (Oral)  Resp 18  SpO2 100%  LMP 10/28/2012  Breastfeeding? No  General Appearance:    Alert, cooperative, no distress, appears stated age  Head:    Normocephalic, without obvious abnormality, atraumatic  Eyes:    PERRL, conjunctiva/corneas clear, EOM's intact, fundi    benign, both eyes, anicteric       Throat:   Lips, tongue and mucosa dry. Teeth and gums normal  Neck:   Supple, symmetrical, trachea midline, no adenopathy;       thyroid:  No enlargement/tenderness/nodules; no carotid   bruit or JVD  Back:     Symmetric, no curvature, ROM normal, no CVA tenderness  Lungs:     Clear to auscultation bilaterally, respirations unlabored  Chest wall:    No tenderness or deformity  Heart:    Regular rate and rhythm, S1 and S2 normal, no murmur, rub   or gallop  Abdomen:     Soft, non-tender, bowel sounds active all four quadrants,    no masses, no organomegaly  Extremities:   Extremities normal, atraumatic, no cyanosis or edema  Pulses:   2+ and symmetric all extremities  Skin:   Skin color, texture, turgor normal, no rashes or lesions  Neurologic:   CNII-XII intact. Normal strength, sensation and reflexes      throughout    Labs on Admission:   Basic Metabolic Panel:  Recent Labs Lab 11/19/12 0450 11/20/12 0649  NA 136 140  K 3.5 3.8  CL 103 106  CO2 25  --   GLUCOSE 99 111*  BUN 5* 4*  CREATININE 0.52 0.60  CALCIUM 9.2  --    Liver Function Tests:  Recent Labs Lab 11/19/12 1125  AST 18   ALT 8  ALKPHOS 51  BILITOT 1.7*  PROT 6.8  ALBUMIN 3.4*   CBC:  Recent Labs Lab 11/19/12 0450 11/20/12 0649  WBC 13.8*  --   NEUTROABS 8.8*  --   HGB 8.8* 10.9*  HCT 25.6* 32.0*  MCV 89.2  --   PLT 532*  --     Assessment/Plan: Active Problems: Acute Vaso-occlusive episode in initial stages but with no evidence of hemolysis: Will evaluate to see if patient showing any signs of hemolysis. However patient is ambulatory and rates her pain  9/10. I will hydrate the patient and treat aggressively with IV analgesics, Toradol and IVF.    Time spend: 35 minutes Code Status: Full Code Family Communication: N/A  Disposition Plan: Home   Micki Cassel A., MD  Pager [phone]  If 7PM-7AM, please contact night-coverage [email or web address] Password [facility] 11/20/2012, 9:18 AM

## 2012-11-20 NOTE — ED Notes (Signed)
Pt c/o sickle cell back and right side pain all night; seen at [facility] center yesterday

## 2012-11-21 NOTE — ED Provider Notes (Signed)
Medical screening examination/treatment/procedure(s) were performed by non-physician practitioner and as supervising physician I was immediately available for consultation/collaboration.  Jones Skene, M.D.     Jones Skene, MD 11/21/12 0405

## 2012-11-22 ENCOUNTER — Telehealth: Payer: Self-pay

## 2012-11-22 IMAGING — CR DG CHEST 2V
2 series · 2 of 2 positions shown · non-contrast
Comparison: 12/17/2011.

CLINICAL DATA: Chest pain.  Sickle cell crisis.

CHEST - 2 VIEW

[w chest pa]
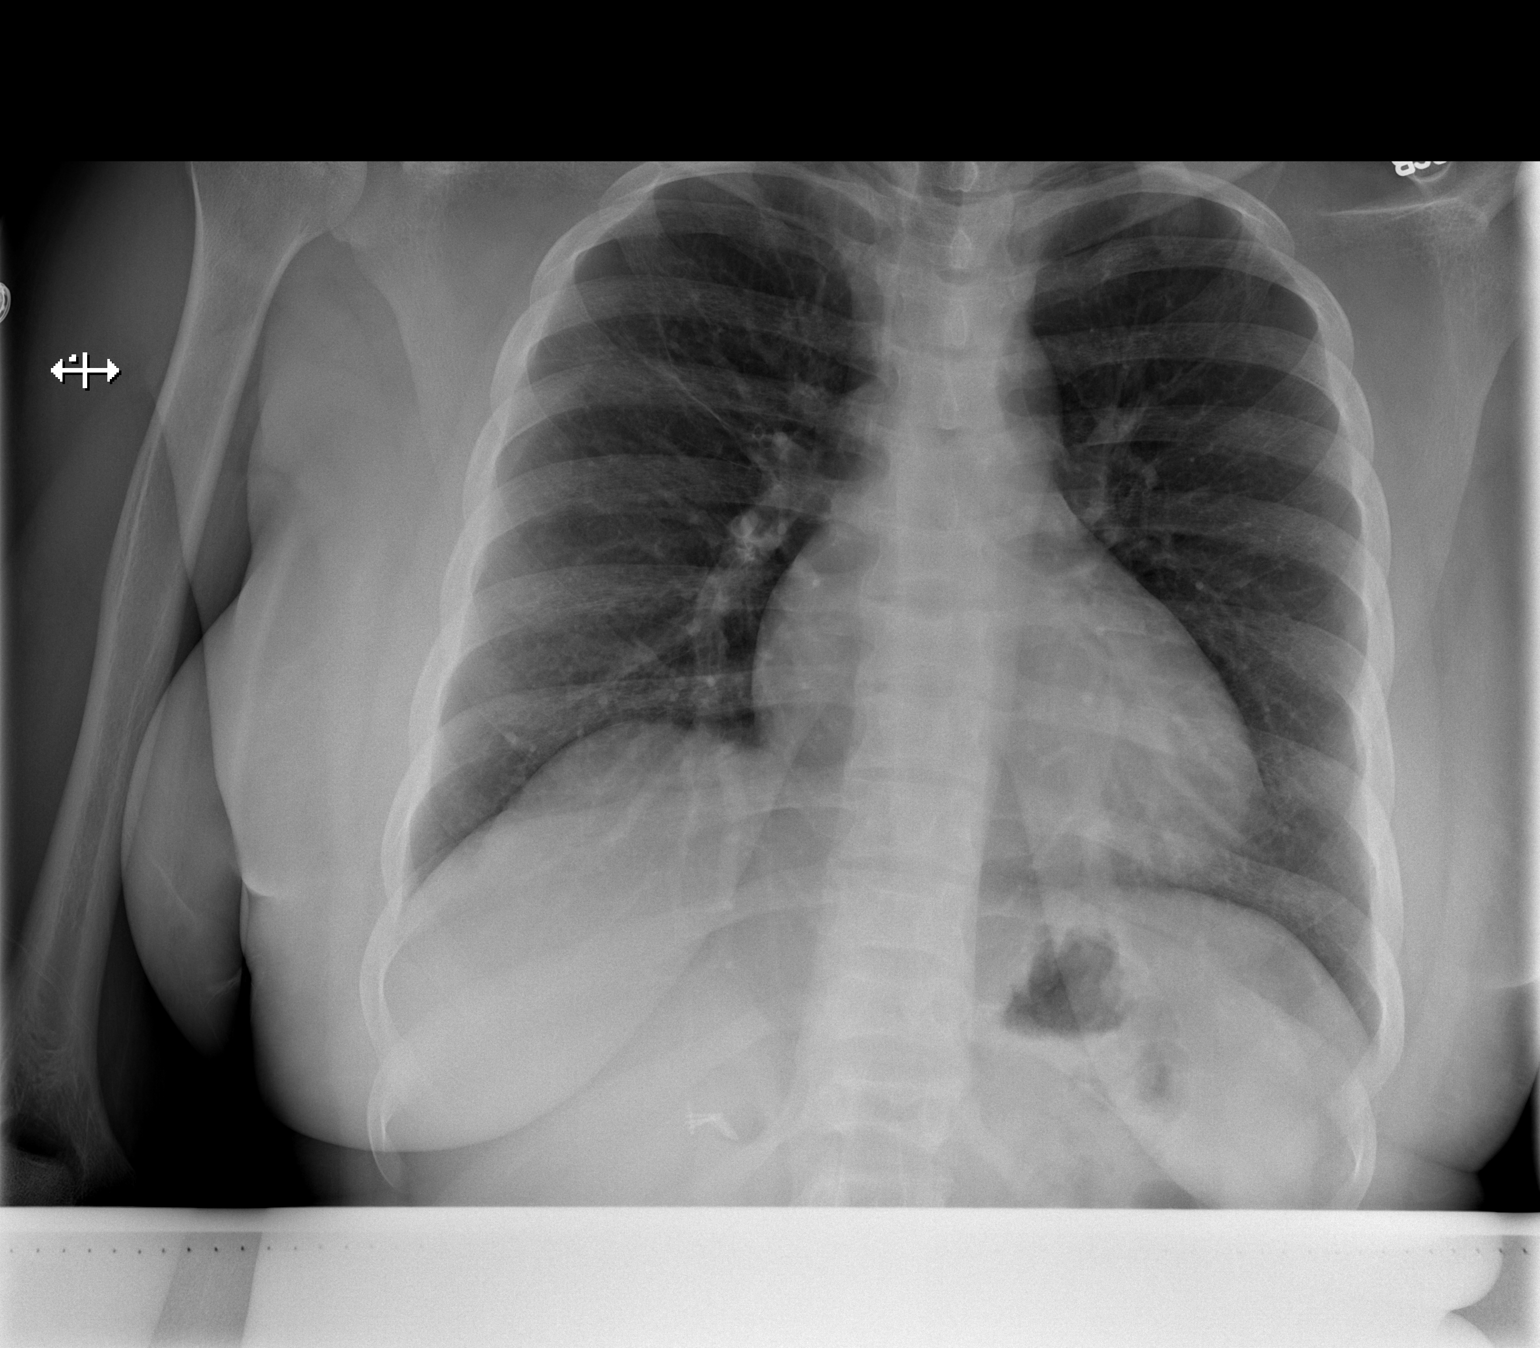

[w chest lat]
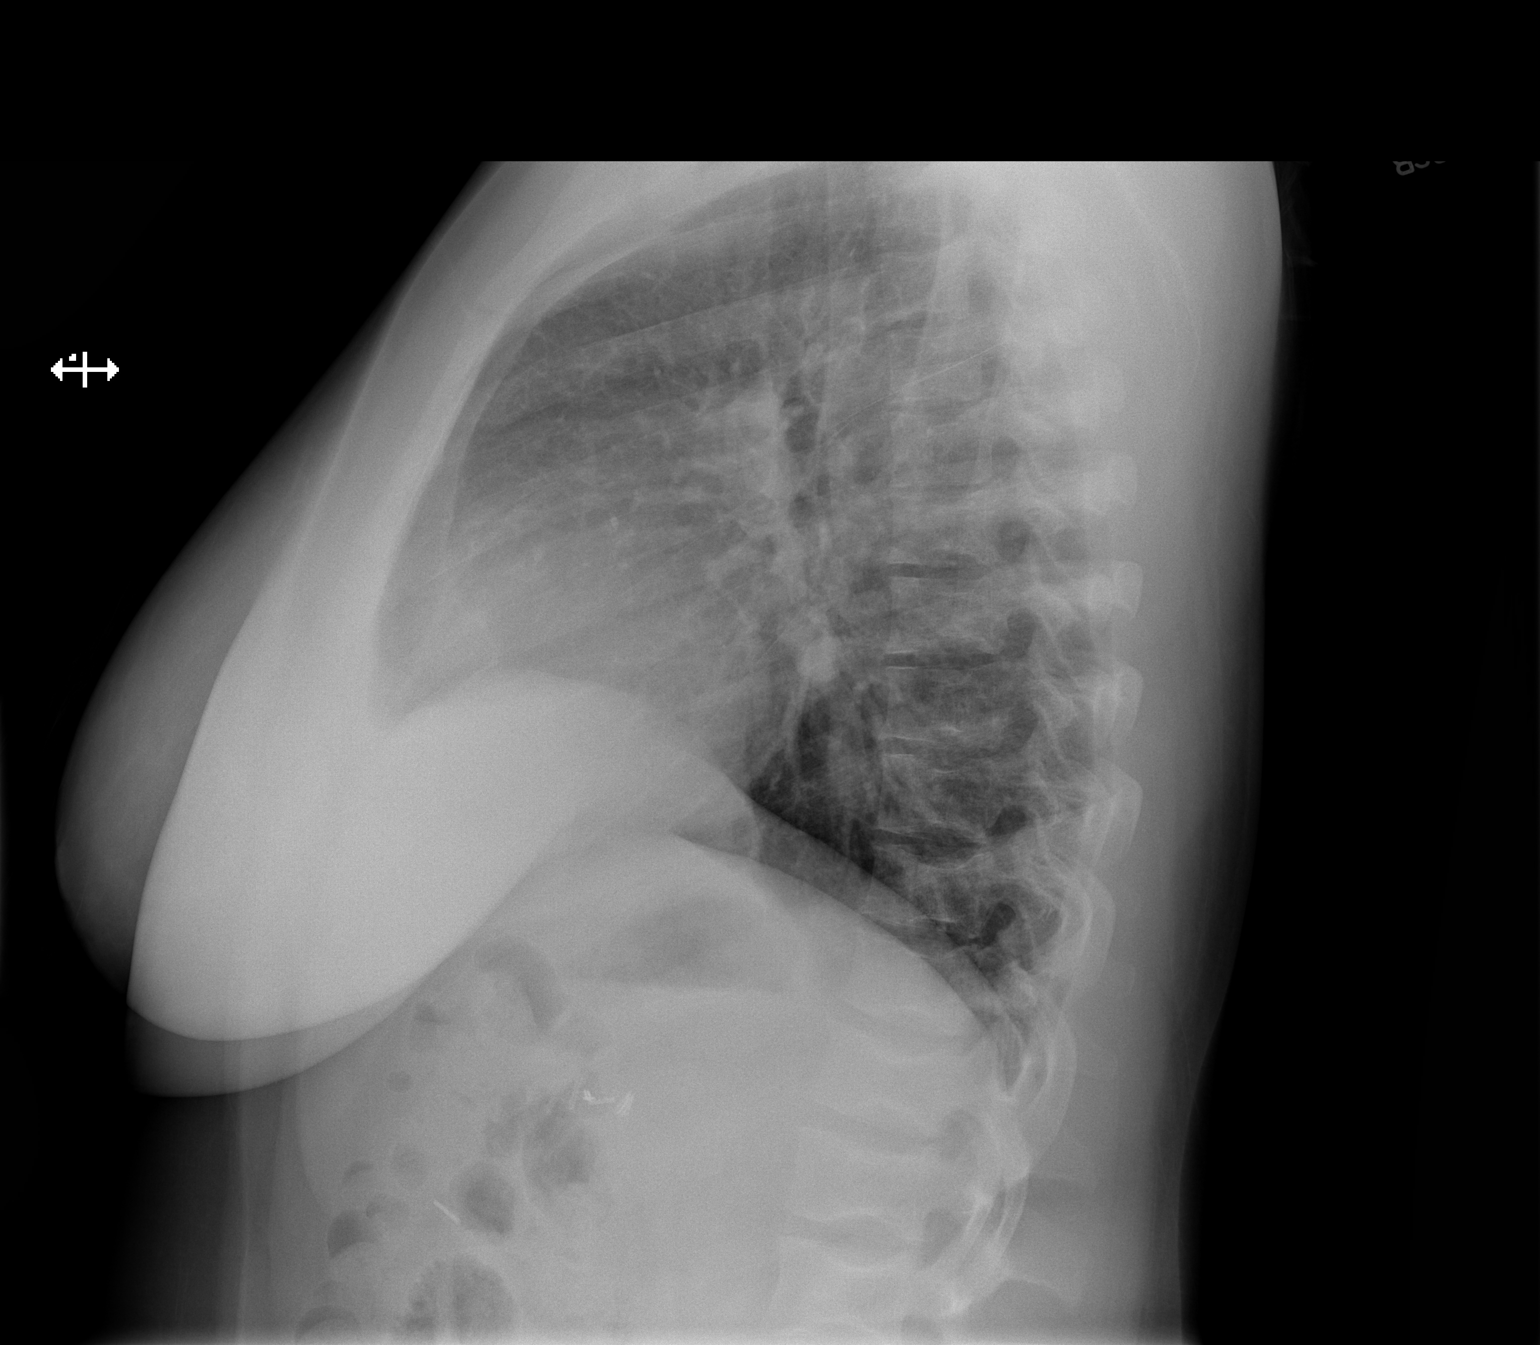

[2 of 2 positions shown; findings below may reference images not displayed]

FINDINGS: The cardiac silhouette, mediastinal and hilar contours
are within normal limits and stable.  The lungs are clear of acute
process.  Minimal stable scarring changes.  No pleural effusion.
The bony thorax is intact.
IMPRESSION: No acute cardiopulmonary findings.

## 2012-11-22 IMAGING — CR DG HIP COMPLETE 2+V*R*
3 series · 3 of 3 positions shown · non-contrast
Comparison: 11/24/2011.

CLINICAL DATA: Right hip pain.

RIGHT HIP - COMPLETE 2+ VIEW

[t pelvis ap]
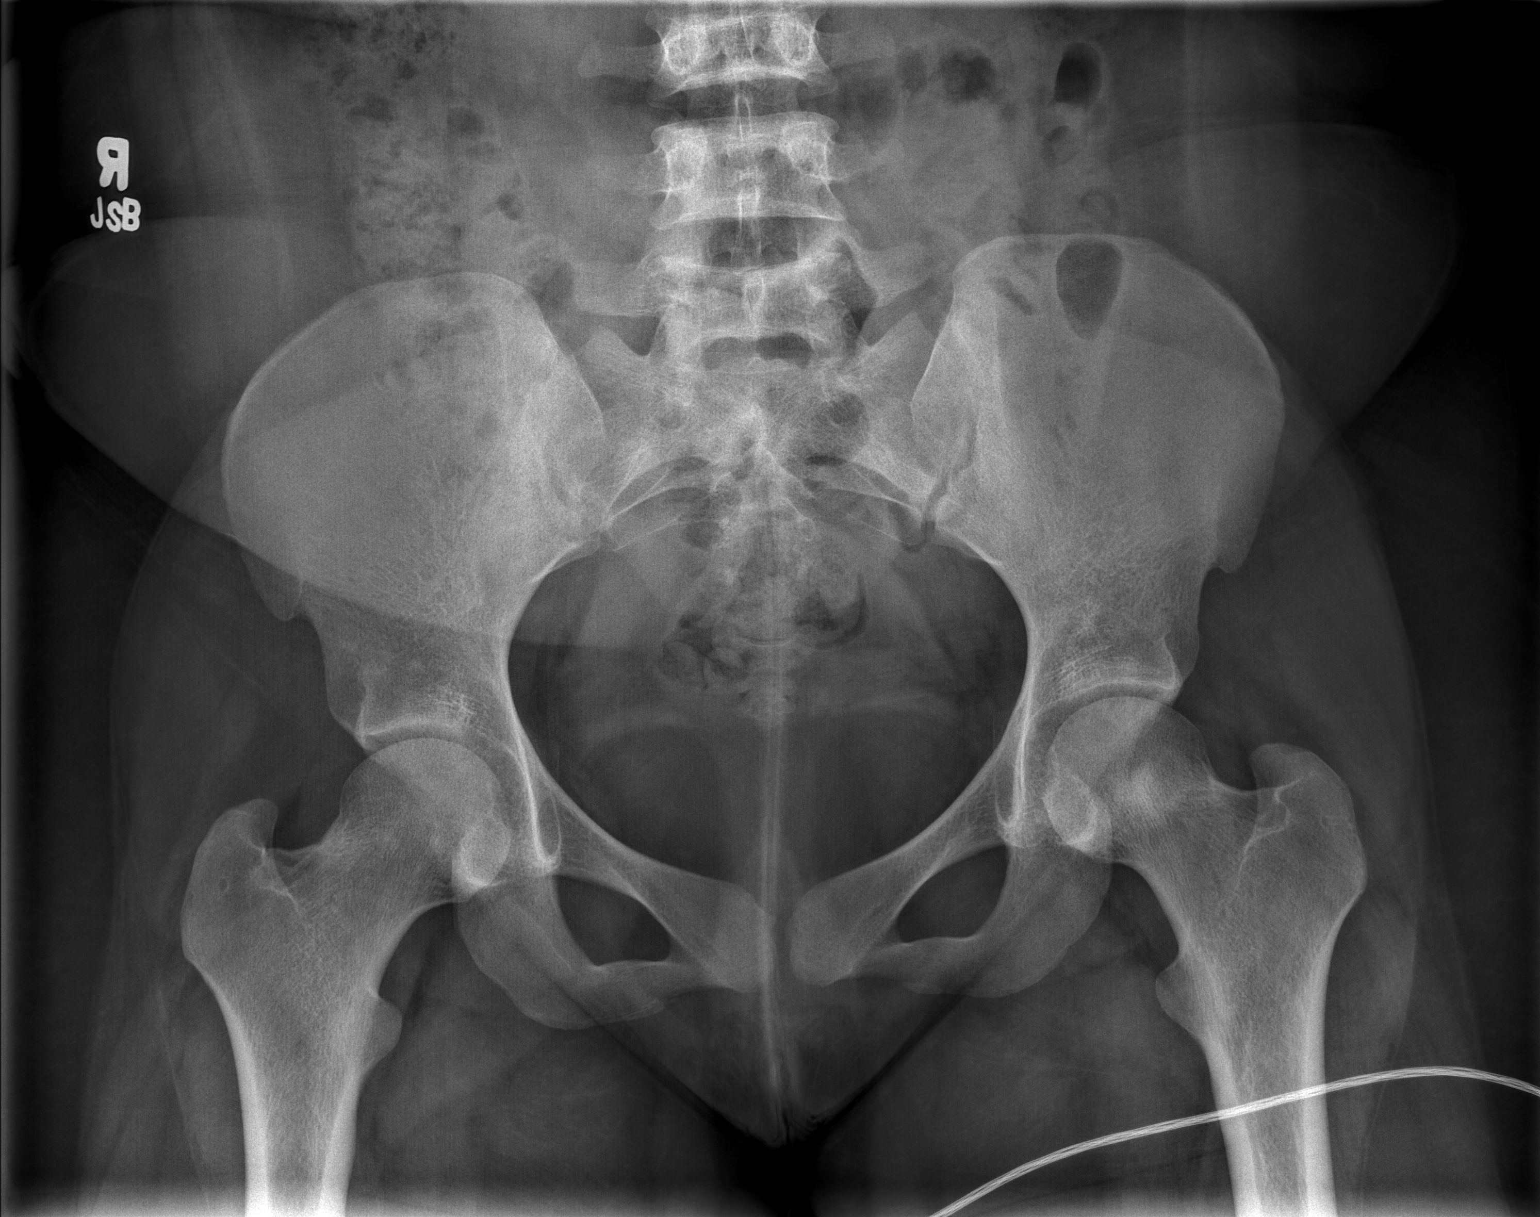

[t hip ap right]
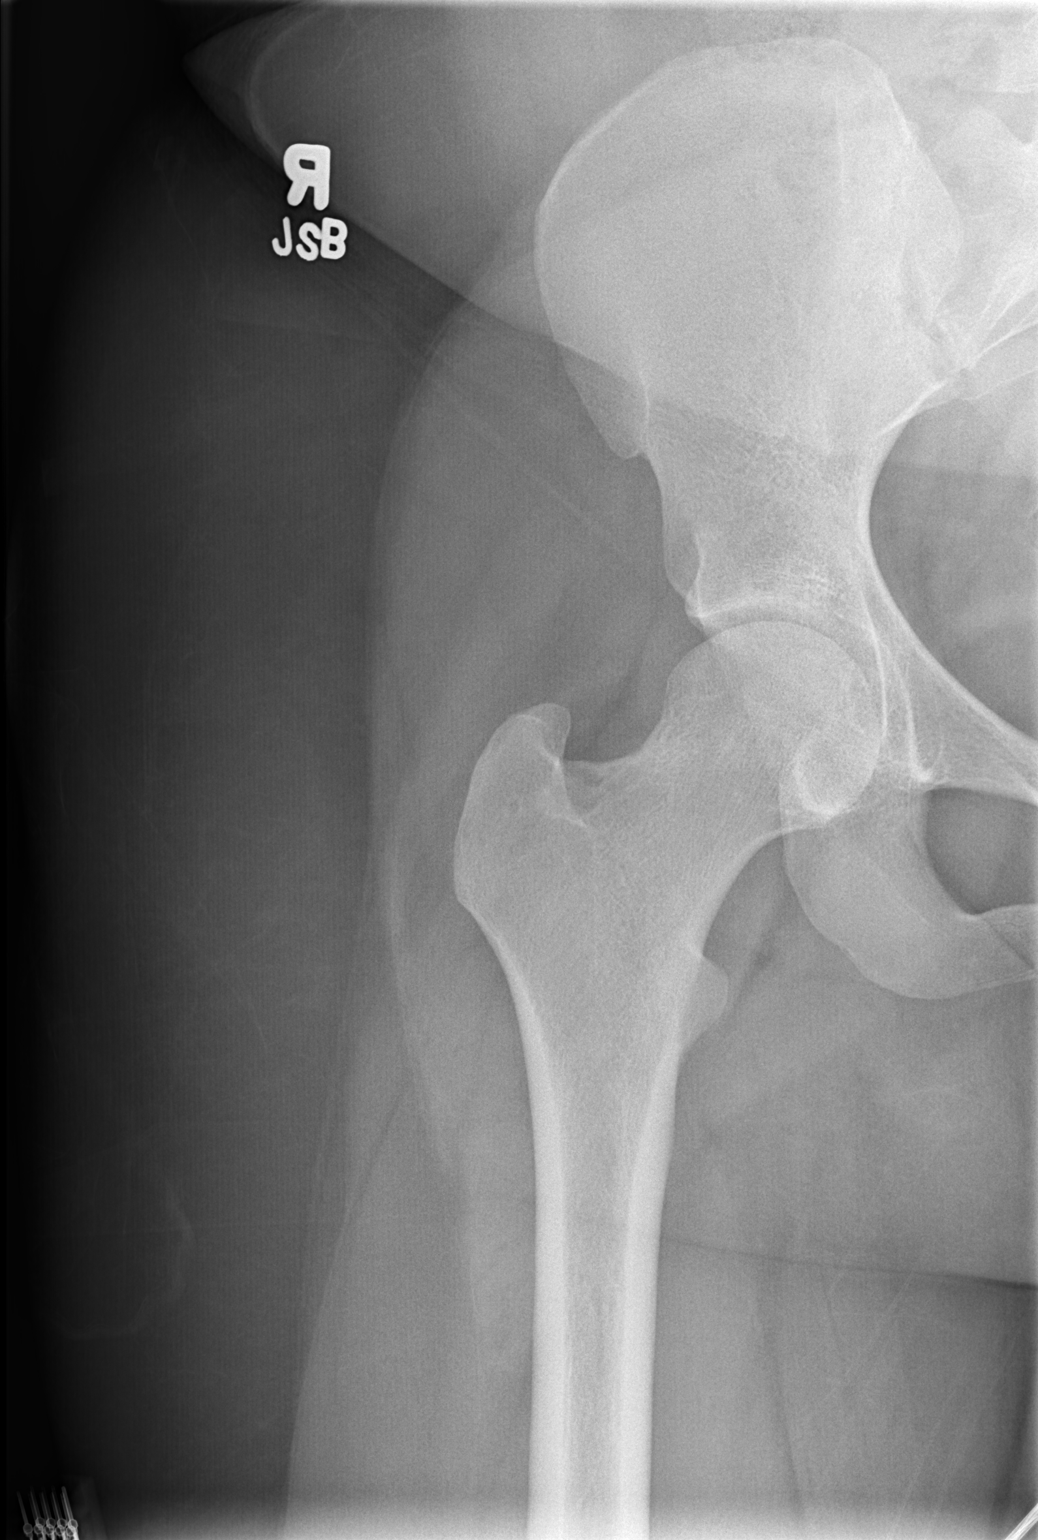

[t hip frog leg right]
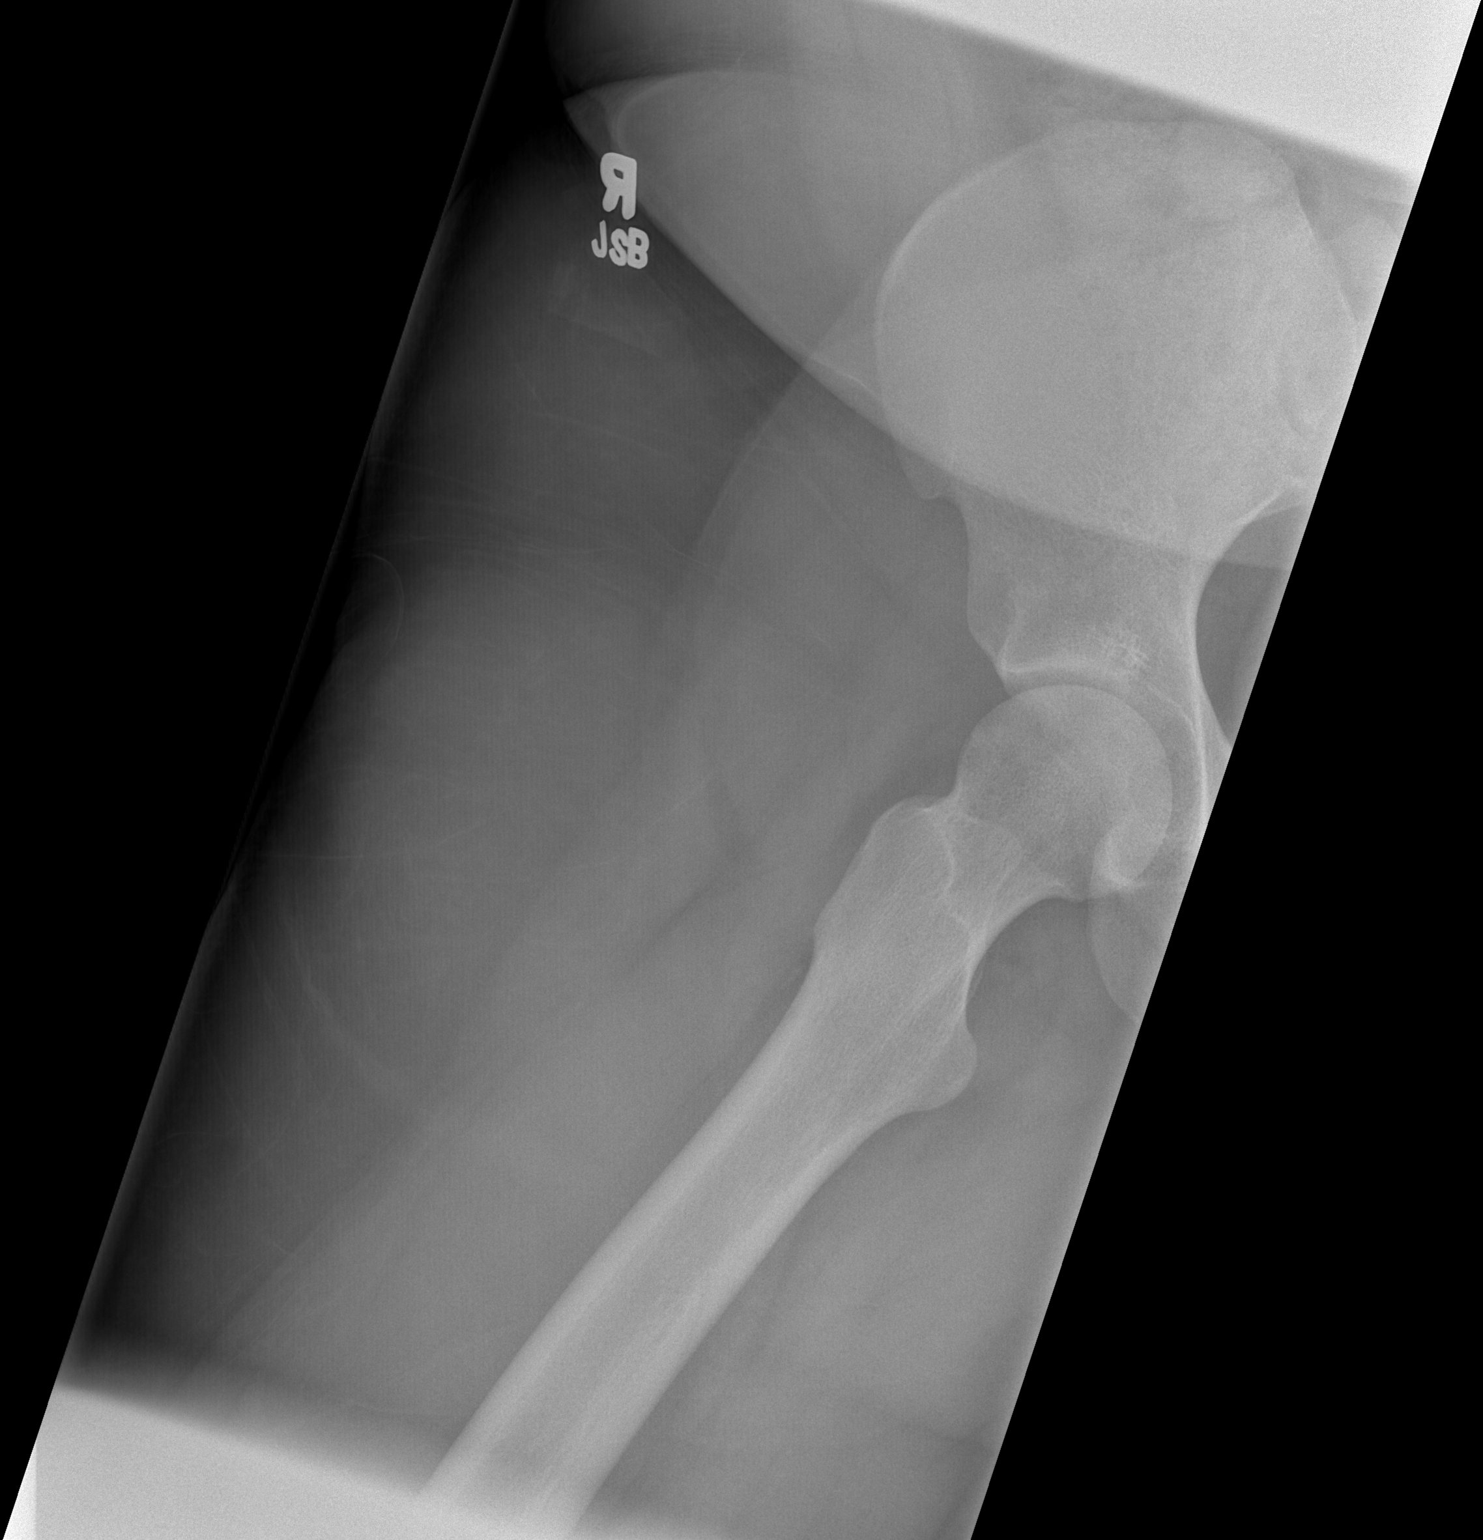

[3 of 3 positions shown; findings below may reference images not displayed]

FINDINGS: Both hips are normally located.  No significant
degenerative changes.  No evidence of stress fracture or avascular
necrosis.  Stable area of mild sclerosis in the central head neck
junction region on the left side.  The pubic symphysis and SI
joints are intact.  No pelvic fractures.
IMPRESSION: No acute bony findings.

## 2012-11-25 NOTE — Discharge Summary (Signed)
Patient seen and examined. Agree with assessment and plan to discharge home

## 2012-11-25 NOTE — H&P (Signed)
Patient seen and examined and discussed with NP Gwinda Passe. I agree with assessment and plan. Anticipate patient should be able to be discharged home after treatment

## 2012-11-25 NOTE — Discharge Summary (Signed)
Pt seen and examined and discussed with nurse practitioner. I agree with discharge to home.

## 2012-11-27 NOTE — Telephone Encounter (Signed)
Opened in error

## 2012-12-04 ENCOUNTER — Encounter: Payer: Self-pay | Admitting: Primary Care

## 2012-12-04 ENCOUNTER — Telehealth: Payer: Self-pay | Admitting: Internal Medicine

## 2012-12-04 ENCOUNTER — Ambulatory Visit (INDEPENDENT_AMBULATORY_CARE_PROVIDER_SITE_OTHER): Payer: Medicare Other | Admitting: Primary Care

## 2012-12-04 VITALS — BP 116/60 | HR 73 | Temp 98.7°F | Wt 192.0 lb

## 2012-12-04 DIAGNOSIS — G8929 Other chronic pain: Secondary | ICD-10-CM | POA: Diagnosis not present

## 2012-12-04 DIAGNOSIS — D571 Sickle-cell disease without crisis: Secondary | ICD-10-CM

## 2012-12-04 MED ORDER — OXYCODONE HCL 5 MG PO TABS: ORAL_TABLET | ORAL | Status: DC

## 2012-12-04 MED ORDER — OXYCODONE HCL 10 MG PO TABS: ORAL_TABLET | ORAL | Status: DC

## 2012-12-04 MED ORDER — OXYCODONE HCL 15 MG PO TABS: ORAL_TABLET | ORAL | Status: DC

## 2012-12-04 NOTE — Telephone Encounter (Signed)
Refills given during office visit

## 2012-12-04 NOTE — Progress Notes (Signed)
Patient ID: [name], female   DOB: 1990/07/09, 22 y.o.   MRN: [id number]  S[facility] SERV[facility] PROGRESS NOTE  PCP: Marthann Schiller, MD 12/04/2012   Chief Complaint  Patient presents with  . Follow-up    Subjective:  [name] is in today for follow up for the management of her sickle cell pain and dosage change. She does express feeling ok but having low back pain. She rates her pain 4/10 and is manageable. Also, voice a little dismay in not being able to move into her apartment as she expected due to situational stressors at home with her mom.     Review of Systems  Constitutional: Negative.   HENT: Negative.   Eyes: Negative.   Respiratory: Negative.   Cardiovascular: Negative.   Gastrointestinal: Positive for diarrhea.  Genitourinary: Negative.   Musculoskeletal: Positive for back pain.  Skin: Negative.   Neurological: Negative.   Endo/Heme/Allergies: Negative.   Psychiatric/Behavioral: Negative.    Allergies  Allergen Reactions  . Carrot Oil Hives and Swelling  . Latex Rash     Outpatient Encounter Prescriptions as of 12/04/2012  Medication Sig Dispense Refill  . folic acid (FOLVITE) 800 MCG tablet Take 800 mcg by mouth every morning.      . hydroxyurea (HYDREA) 500 MG capsule Take 2 capsules (1,000 mg total) by mouth at bedtime. May take with food to minimize GI side effects.  60 capsule  3  . ibuprofen (ADVIL,MOTRIN) 600 MG tablet Take 1 tablet (600 mg total) by mouth every 6 (six) hours as needed for pain.  60 tablet  0  . oxyCODONE (OXY IR/ROXICODONE) 5 MG immediate release tablet 10 mg 1 x day as second dose in q 8 hour dosing (medication being weaned).  60 tablet  0  . oxyCODONE (ROXICODONE) 15 MG immediate release tablet 15 mg every am and pm q 16 hours PRN for pain (taken as q 8hrs PRN with 10 mg as second dose-medication being weaned).  60 tablet  0  . polyethylene glycol (MIRALAX / GLYCOLAX) packet Take 17 g by mouth daily.  30 each  0  .  sertraline (ZOLOFT) 50 MG tablet Take 1 tablet (50 mg total) by mouth daily.  30 tablet  3   No facility-administered encounter medications on file as of 12/04/2012.    Physical Exam   Filed Vitals:   12/04/12 1457  BP: 116/60  Pulse: 73  Temp: 98.7 F (37.1 C)    General: Alert, awake, oriented x3, in no acute distress.  HEENT: [place]/AT PEERL, EOMI Neck: Trachea midline,  no masses, no thyromegal,y no JVD, no carotid bruit OROPHARYNX:  Moist, No exudate/ erythema/lesions.  Heart: Regular rate and rhythm, without murmurs, rubs, gallops, PMI non-displaced, no heaves or thrills on palpation.  Lungs: Clear to auscultation, no wheezing or rhonchi noted. No vocal fremitus  Abdomen: obese Soft, nontender, nondistended, positive bowel sounds Neuro: No focal neurological deficits noted cranial nerves II through XII grossly intact Musculoskeletal: No warm swelling or erythema around joints Psychiatric: Patient alert and oriented x3 Lymph node survey: No cervical axillary    Assessment/Plan: Sickle cell disease without crisis: stable at this time She states ahe is taking her maintenance medications on a regular bases folic acid and hydroxurea. Pain is managed by prn analgesics Oxycodone 15mg  Q 6 hrs prn. Continue maintenance medication hydroxyurea and folic acid. Depression: stable at this time no ideation of harm to self or others cont. zoloft   Constipation: secondary to narcotic  use cont miralax     Gwinda Passe, NP-C

## 2012-12-06 MED ORDER — OXYCODONE HCL 15 MG PO TABS: ORAL_TABLET | ORAL | Status: DC

## 2012-12-06 NOTE — Addendum Note (Signed)
Addended by: Grayce Sessions on: 12/06/2012 05:09 PM   Modules accepted: Orders

## 2012-12-10 MED ORDER — OXYCODONE HCL 5 MG PO TABS: 5.0000 mg | ORAL_TABLET | Freq: Three times a day (TID) | ORAL | Status: DC | PRN

## 2012-12-17 ENCOUNTER — Telehealth: Payer: Self-pay | Admitting: Internal Medicine

## 2012-12-17 DIAGNOSIS — D571 Sickle-cell disease without crisis: Secondary | ICD-10-CM

## 2012-12-17 DIAGNOSIS — G8929 Other chronic pain: Secondary | ICD-10-CM

## 2012-12-18 MED ORDER — OXYCODONE HCL 15 MG PO TABS: ORAL_TABLET | ORAL | Status: DC

## 2012-12-18 MED ORDER — OXYCODONE HCL 5 MG PO TABS: 5.0000 mg | ORAL_TABLET | Freq: Three times a day (TID) | ORAL | Status: DC | PRN

## 2012-12-18 NOTE — Telephone Encounter (Signed)
No changes in medication and dosages pt has been doing well on Oxycodone 15mg  and Oxycodone 5mg  both can be taken Q 6hrs she alternates doses or combines to manage pain at home # 60 of both given

## 2012-12-18 NOTE — Addendum Note (Signed)
Addended by: Grayce Sessions on: 12/18/2012 03:20 PM   Modules accepted: Orders

## 2012-12-28 IMAGING — CT CT HEAD W/O CM
1 series · 16 of 30 positions shown, 20 images · non-contrast
Comparison: April 17, 2011.

CLINICAL DATA: Headache.

CT HEAD WITHOUT CONTRAST
TECHNIQUE: Contiguous axial images were obtained from the base of
the skull through the vertex without contrast.

[Series 2: head routine 4.8 h37s · axial · 0.43mm/px · z∈[-140,+12]mm · 16 of 36 slices shown, 20 images]
[im 2/36  brain]
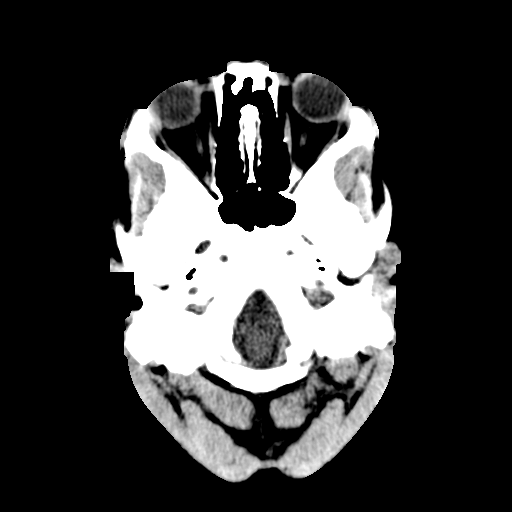
[im 2/36  bone]
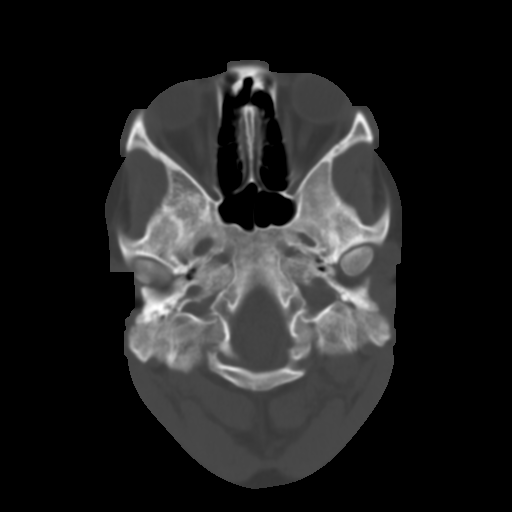
[im 4/36  brain]
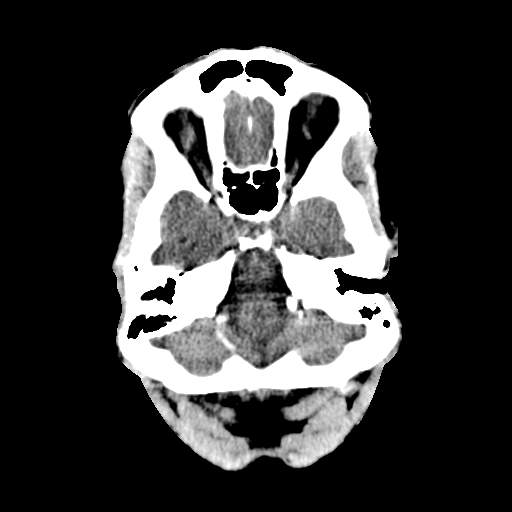
[im 7/36  brain]
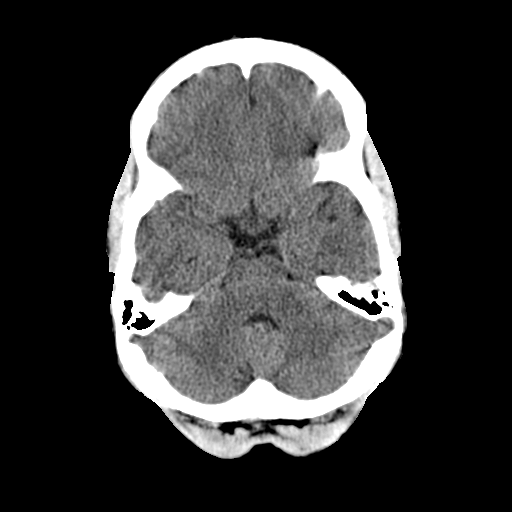
[im 9/36  brain]
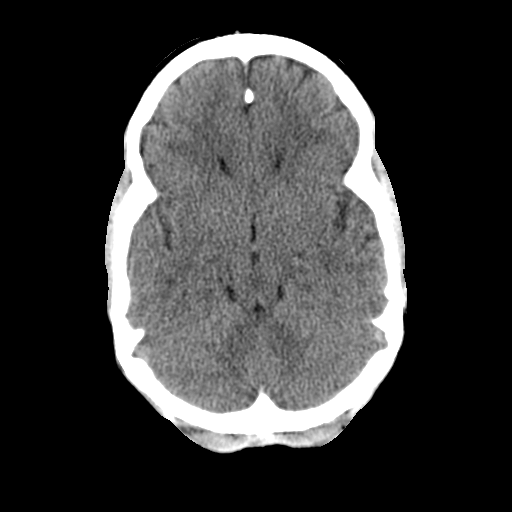
[im 10/36  brain]
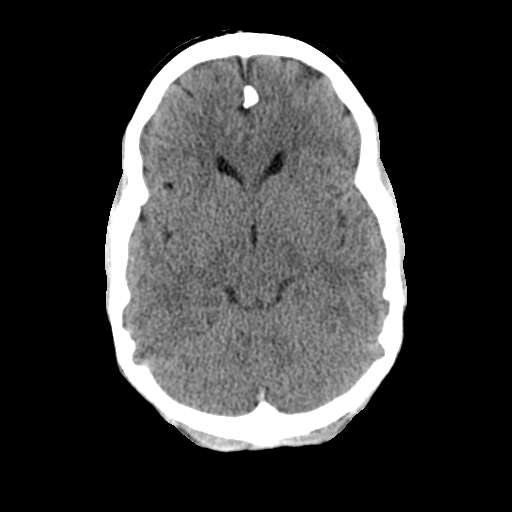
[im 10/36  bone]
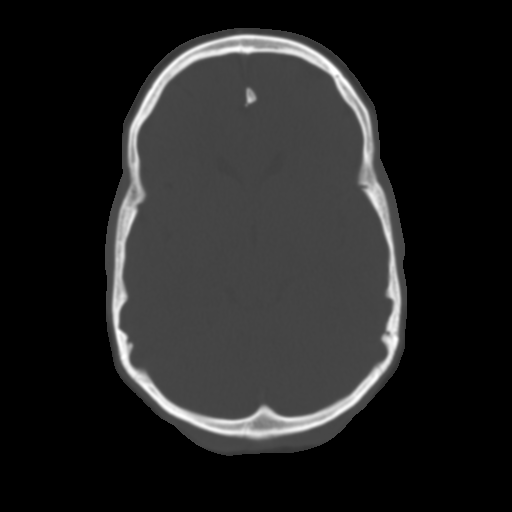
[im 13/36  brain]
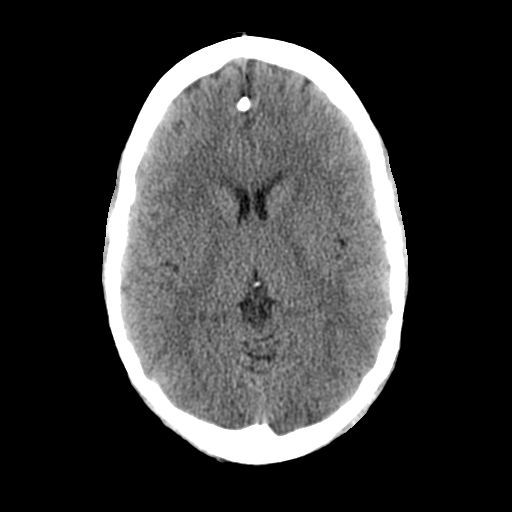
[im 15/36  brain]
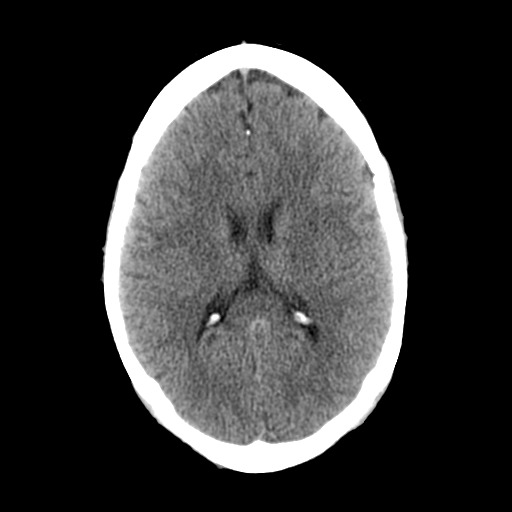
[im 17/36  brain]
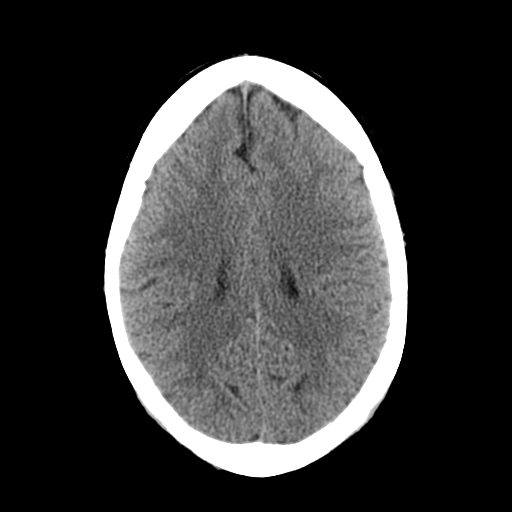
[im 19/36  brain]
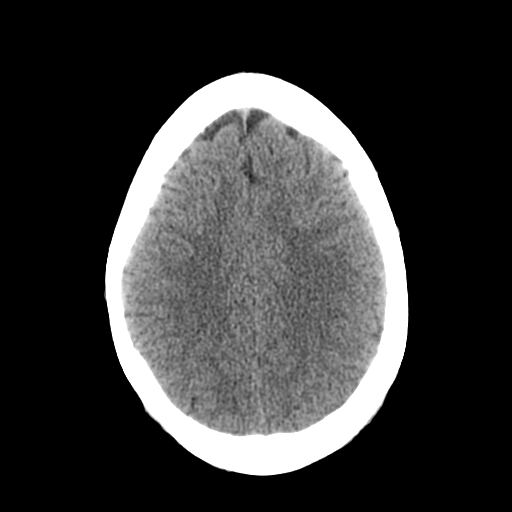
[im 19/36  bone]
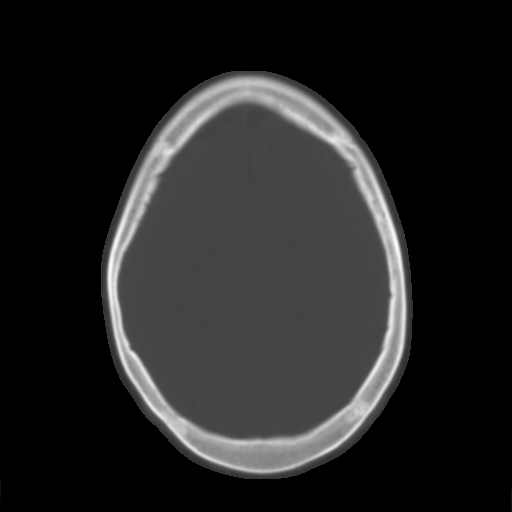
[im 21/36  brain]
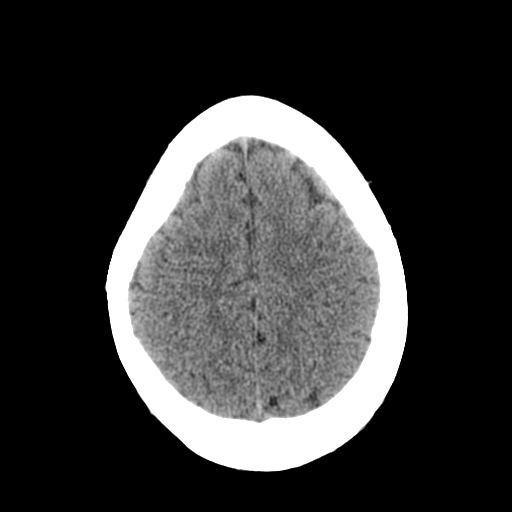
[im 23/36  brain]
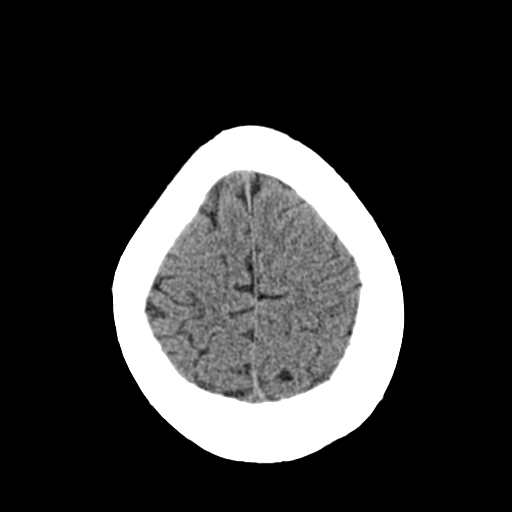
[im 26/36  brain]
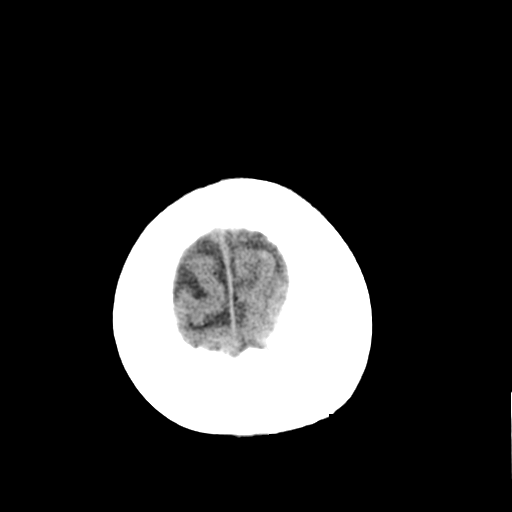
[im 27/36  brain]
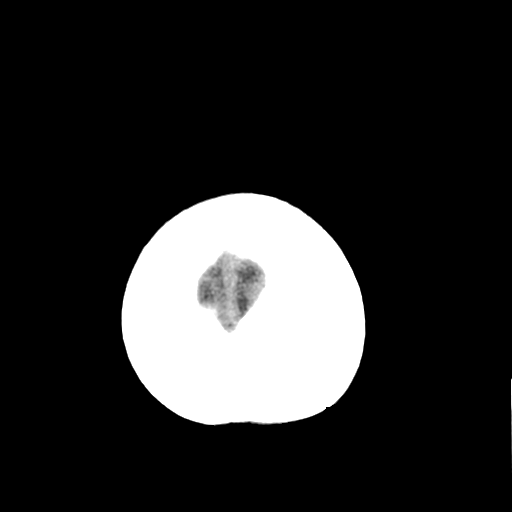
[im 27/36  bone]
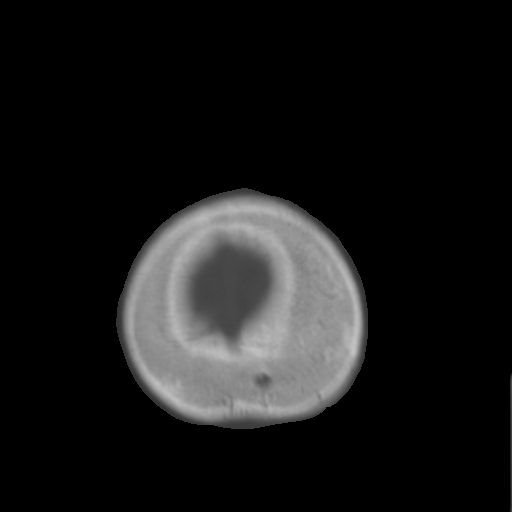
[im 29/36  brain]
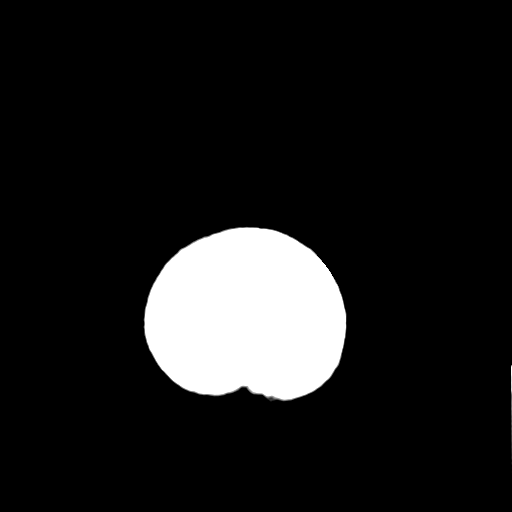
[im 32/36  brain]
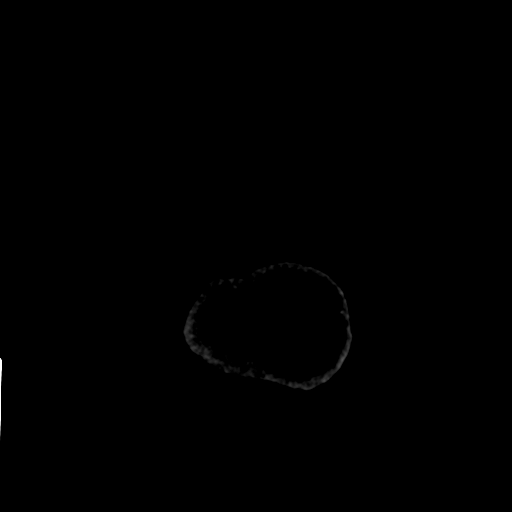
[im 34/36  brain]
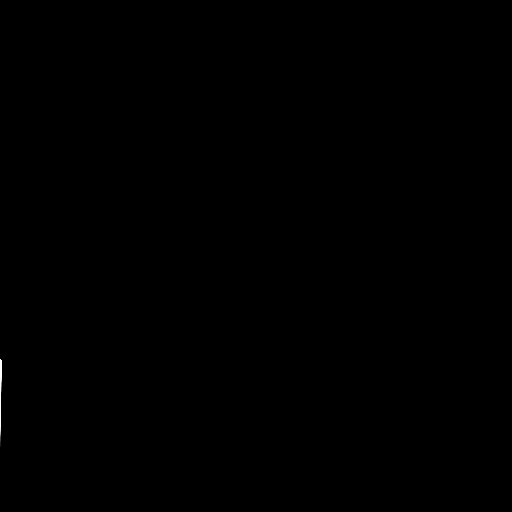

[16 of 30 positions shown; findings below may reference images not displayed]

FINDINGS: Bony calvarium is intact.  No mass effect or midline
shift is noted.  Ventricular size is within normal limits.  There
is no evidence of mass, hemorrhage or infarction.
IMPRESSION: No gross intracranial abnormality seen.

## 2012-12-28 IMAGING — CR DG ABDOMEN 1V
1 series · 1 of 1 positions shown · non-contrast
Comparison: 06/22/2011

CLINICAL DATA: Lower abdominal pain

ABDOMEN - 1 VIEW

[t abdomen supine]
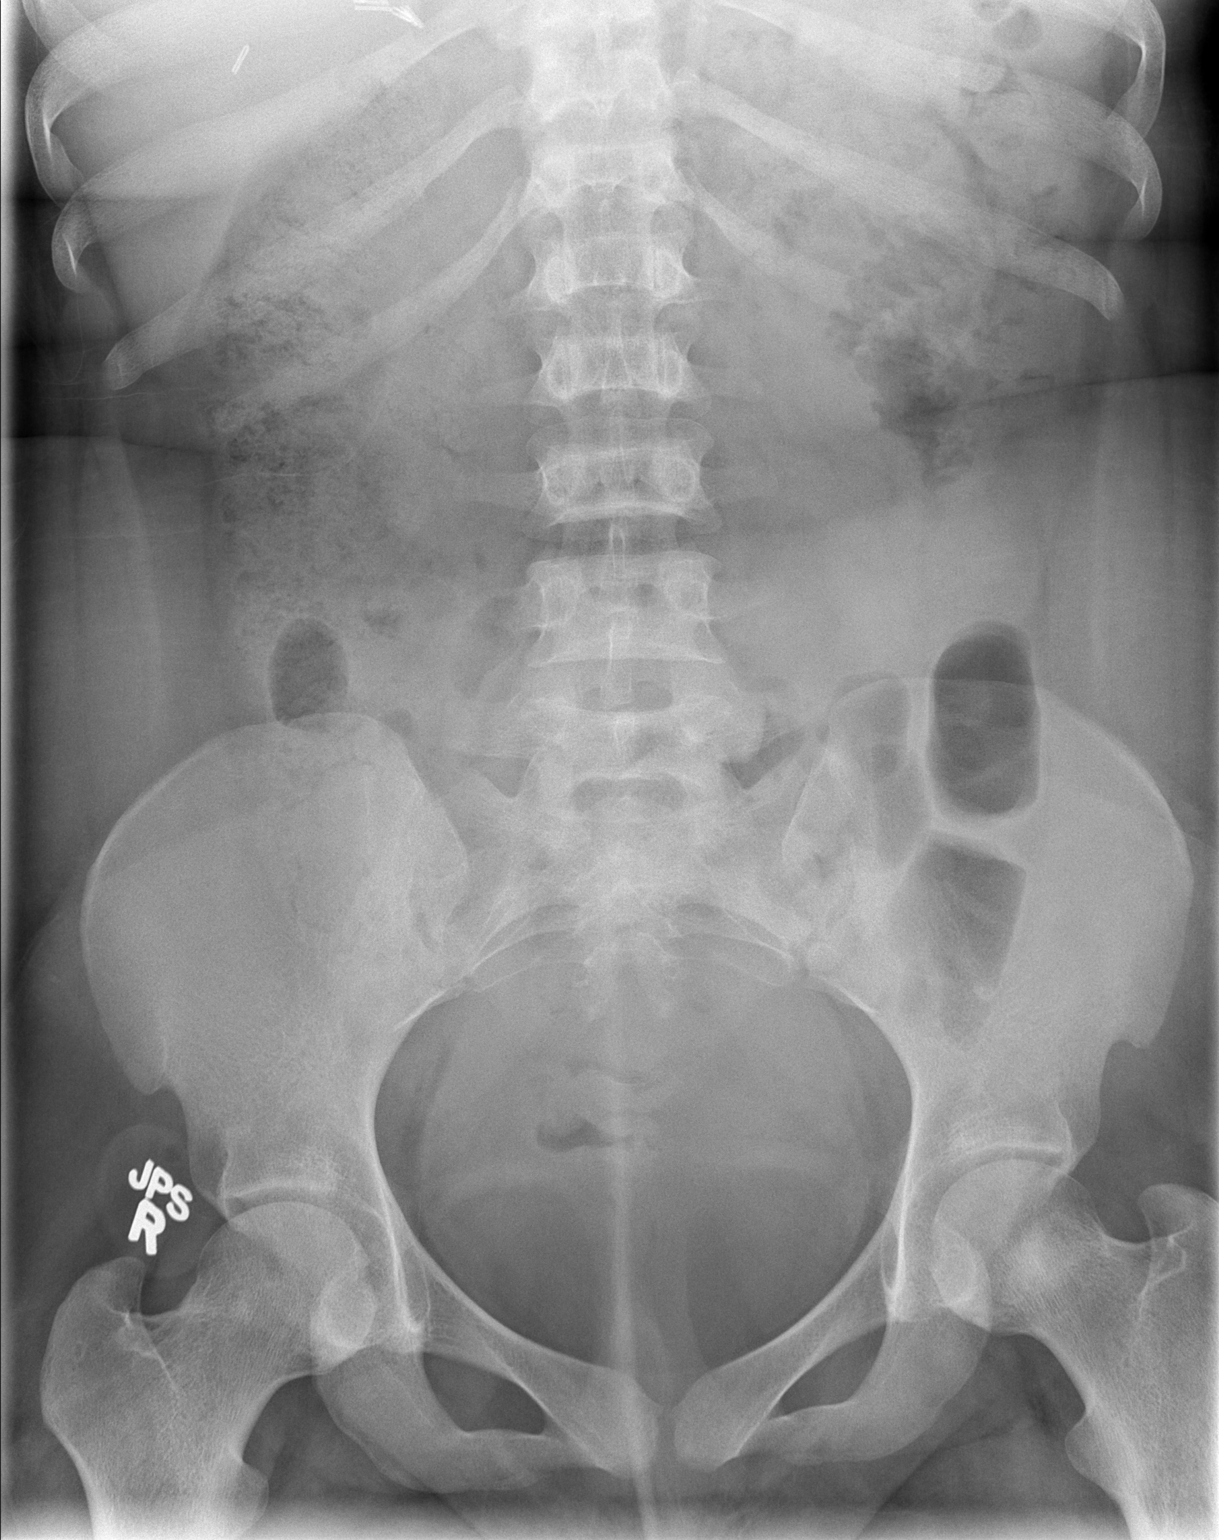

[1 of 1 positions shown; findings below may reference images not displayed]

FINDINGS: There is a moderate amount of fecal matter in the colon.
The gas pattern is otherwise unremarkable.  No sign of ileus,
obstruction, soft tissue calcification or significant bony finding.
Biconcave vertebral bodies consistent with sickle cell.  The There
are clips in the right upper quadrant consistent with previous
cholecystectomy.
IMPRESSION: Moderate amount of fecal matter in the colon.  Otherwise
unremarkable.

## 2012-12-31 ENCOUNTER — Emergency Department (HOSPITAL_COMMUNITY): Payer: Medicare Other

## 2012-12-31 ENCOUNTER — Inpatient Hospital Stay (HOSPITAL_COMMUNITY)
Admission: EM | Admit: 2012-12-31 | Discharge: 2013-01-06 | DRG: 812 | Disposition: A | Discharge status: Home / Self Care | Payer: Medicare Other | Attending: Internal Medicine | Admitting: Internal Medicine

## 2012-12-31 ENCOUNTER — Encounter (HOSPITAL_COMMUNITY): Payer: Self-pay | Admitting: *Deleted

## 2012-12-31 ENCOUNTER — Telehealth: Payer: Self-pay | Admitting: Internal Medicine

## 2012-12-31 DIAGNOSIS — D571 Sickle-cell disease without crisis: Secondary | ICD-10-CM

## 2012-12-31 DIAGNOSIS — D72829 Elevated white blood cell count, unspecified: Secondary | ICD-10-CM | POA: Diagnosis not present

## 2012-12-31 DIAGNOSIS — E876 Hypokalemia: Secondary | ICD-10-CM | POA: Diagnosis present

## 2012-12-31 DIAGNOSIS — F6389 Other impulse disorders: Secondary | ICD-10-CM | POA: Diagnosis present

## 2012-12-31 DIAGNOSIS — E860 Dehydration: Secondary | ICD-10-CM | POA: Diagnosis not present

## 2012-12-31 DIAGNOSIS — Z6834 Body mass index (BMI) 34.0-34.9, adult: Secondary | ICD-10-CM | POA: Diagnosis not present

## 2012-12-31 DIAGNOSIS — F17200 Nicotine dependence, unspecified, uncomplicated: Secondary | ICD-10-CM

## 2012-12-31 DIAGNOSIS — Z9089 Acquired absence of other organs: Secondary | ICD-10-CM | POA: Diagnosis not present

## 2012-12-31 DIAGNOSIS — D5700 Hb-SS disease with crisis, unspecified: Secondary | ICD-10-CM | POA: Diagnosis not present

## 2012-12-31 DIAGNOSIS — G8929 Other chronic pain: Secondary | ICD-10-CM

## 2012-12-31 DIAGNOSIS — K5900 Constipation, unspecified: Secondary | ICD-10-CM | POA: Diagnosis not present

## 2012-12-31 DIAGNOSIS — E875 Hyperkalemia: Secondary | ICD-10-CM | POA: Diagnosis not present

## 2012-12-31 DIAGNOSIS — R079 Chest pain, unspecified: Secondary | ICD-10-CM | POA: Diagnosis not present

## 2012-12-31 DIAGNOSIS — J020 Streptococcal pharyngitis: Secondary | ICD-10-CM | POA: Diagnosis not present

## 2012-12-31 DIAGNOSIS — M79609 Pain in unspecified limb: Secondary | ICD-10-CM | POA: Diagnosis not present

## 2012-12-31 LAB — COMPREHENSIVE METABOLIC PANEL
Albumin: 3.7 g/dL (ref 3.5–5.2)
Alkaline Phosphatase: 64 U/L (ref 39–117)
BUN: 4 mg/dL — ABNORMAL LOW (ref 6–23)
Potassium: 3.5 mEq/L (ref 3.5–5.1)
Sodium: 136 mEq/L (ref 135–145)
Total Protein: 7 g/dL (ref 6.0–8.3)

## 2012-12-31 LAB — CBC WITH DIFFERENTIAL/PLATELET
Basophils Relative: 0 % (ref 0–1)
Eosinophils Absolute: 0.3 10*3/uL (ref 0.0–0.7)
Eosinophils Relative: 1 % (ref 0–5)
Hemoglobin: 9.4 g/dL — ABNORMAL LOW (ref 12.0–15.0)
Lymphocytes Relative: 5 % — ABNORMAL LOW (ref 12–46)
Monocytes Relative: 7 % (ref 3–12)
Neutrophils Relative %: 87 % — ABNORMAL HIGH (ref 43–77)
RBC: 2.89 MIL/uL — ABNORMAL LOW (ref 3.87–5.11)
WBC: 26.2 10*3/uL — ABNORMAL HIGH (ref 4.0–10.5)

## 2012-12-31 LAB — URINALYSIS, ROUTINE W REFLEX MICROSCOPIC
Bilirubin Urine: NEGATIVE
Glucose, UA: NEGATIVE mg/dL
Specific Gravity, Urine: 1.011 (ref 1.005–1.030)

## 2012-12-31 LAB — RETICULOCYTES
RBC.: 2.89 MIL/uL — ABNORMAL LOW (ref 3.87–5.11)
Retic Count, Absolute: 326.6 10*3/uL — ABNORMAL HIGH (ref 19.0–186.0)
Retic Ct Pct: 11.3 % — ABNORMAL HIGH (ref 0.4–3.1)

## 2012-12-31 LAB — RAPID STREP SCREEN (MED CTR MEBANE ONLY): Streptococcus, Group A Screen (Direct): POSITIVE — AB

## 2012-12-31 LAB — URINE MICROSCOPIC-ADD ON

## 2012-12-31 LAB — POCT PREGNANCY, URINE: Preg Test, Ur: NEGATIVE

## 2012-12-31 MED ORDER — MENTHOL 3 MG MT LOZG
1.0000 | LOZENGE | OROMUCOSAL | Status: DC | PRN
  Filled 2012-12-31: qty 9

## 2012-12-31 MED ORDER — OXYCODONE HCL 15 MG PO TABS: ORAL_TABLET | ORAL | Status: DC

## 2012-12-31 MED ORDER — FOLIC ACID 1 MG PO TABS
1.0000 mg | ORAL_TABLET | Freq: Every day | ORAL | Status: DC
  Administered 2013-01-01 – 2013-01-06 (×6): 1 mg via ORAL
  Filled 2012-12-31 (×6): qty 1

## 2012-12-31 MED ORDER — POLYETHYLENE GLYCOL 3350 17 G PO PACK
17.0000 g | PACK | Freq: Every day | ORAL | Status: DC
  Administered 2013-01-01 – 2013-01-06 (×6): 17 g via ORAL
  Filled 2012-12-31 (×7): qty 1

## 2012-12-31 MED ORDER — HYDROMORPHONE HCL PF 2 MG/ML IJ SOLN
2.0000 mg | INTRAMUSCULAR | Status: DC | PRN
  Administered 2012-12-31 (×2): 2 mg via INTRAVENOUS
  Filled 2012-12-31 (×2): qty 1

## 2012-12-31 MED ORDER — KETOROLAC TROMETHAMINE 30 MG/ML IJ SOLN
30.0000 mg | Freq: Three times a day (TID) | INTRAMUSCULAR | Status: AC
  Administered 2013-01-01 – 2013-01-05 (×15): 30 mg via INTRAVENOUS
  Filled 2012-12-31 (×18): qty 1

## 2012-12-31 MED ORDER — HYDROMORPHONE HCL PF 2 MG/ML IJ SOLN
2.0000 mg | INTRAMUSCULAR | Status: DC | PRN
  Administered 2013-01-01: 2 mg via INTRAVENOUS
  Filled 2012-12-31: qty 1

## 2012-12-31 MED ORDER — ENOXAPARIN SODIUM 40 MG/0.4ML ~~LOC~~ SOLN
40.0000 mg | Freq: Every day | SUBCUTANEOUS | Status: DC
  Administered 2013-01-01 – 2013-01-06 (×5): 40 mg via SUBCUTANEOUS
  Filled 2012-12-31 (×6): qty 0.4

## 2012-12-31 MED ORDER — IBUPROFEN 600 MG PO TABS
600.0000 mg | ORAL_TABLET | Freq: Four times a day (QID) | ORAL | Status: DC | PRN
  Filled 2012-12-31: qty 1

## 2012-12-31 MED ORDER — KETOROLAC TROMETHAMINE 30 MG/ML IJ SOLN
30.0000 mg | Freq: Once | INTRAMUSCULAR | Status: DC
  Filled 2012-12-31: qty 1

## 2012-12-31 MED ORDER — SERTRALINE HCL 50 MG PO TABS
50.0000 mg | ORAL_TABLET | Freq: Every day | ORAL | Status: DC
  Administered 2013-01-01 – 2013-01-06 (×6): 50 mg via ORAL
  Filled 2012-12-31 (×6): qty 1

## 2012-12-31 MED ORDER — HYDROMORPHONE HCL PF 2 MG/ML IJ SOLN
2.0000 mg | Freq: Once | INTRAMUSCULAR | Status: AC
  Administered 2012-12-31: 2 mg via INTRAVENOUS
  Filled 2012-12-31: qty 1

## 2012-12-31 MED ORDER — HYDROMORPHONE HCL PF 1 MG/ML IJ SOLN
1.0000 mg | Freq: Once | INTRAMUSCULAR | Status: AC
  Administered 2012-12-31: 1 mg via INTRAVENOUS
  Filled 2012-12-31: qty 1

## 2012-12-31 MED ORDER — HYDROXYUREA 500 MG PO CAPS
1000.0000 mg | ORAL_CAPSULE | Freq: Every day | ORAL | Status: DC
  Administered 2013-01-01 – 2013-01-05 (×6): 1000 mg via ORAL
  Filled 2012-12-31 (×7): qty 2

## 2012-12-31 MED ORDER — HYDROMORPHONE HCL PF 1 MG/ML IJ SOLN: 1.0000 mg | Freq: Once | INTRAMUSCULAR | Status: DC

## 2012-12-31 MED ORDER — DEXTROSE-NACL 5-0.45 % IV SOLN
INTRAVENOUS | Status: DC
  Administered 2013-01-01 – 2013-01-05 (×5): via INTRAVENOUS

## 2012-12-31 MED ORDER — ONDANSETRON HCL 4 MG/2ML IJ SOLN
4.0000 mg | Freq: Four times a day (QID) | INTRAMUSCULAR | Status: DC | PRN
  Administered 2013-01-01: 4 mg via INTRAVENOUS
  Filled 2012-12-31: qty 2

## 2012-12-31 MED ORDER — SODIUM CHLORIDE 0.9 % IV SOLN
Freq: Once | INTRAVENOUS | Status: AC
  Administered 2012-12-31: 18:00:00 via INTRAVENOUS

## 2012-12-31 MED ORDER — AMOXICILLIN 500 MG PO CAPS
500.0000 mg | ORAL_CAPSULE | Freq: Three times a day (TID) | ORAL | Status: DC
  Administered 2013-01-01 – 2013-01-06 (×18): 500 mg via ORAL
  Filled 2012-12-31 (×20): qty 1

## 2012-12-31 MED ORDER — OXYCODONE HCL 5 MG PO TABS: 15.0000 mg | ORAL_TABLET | Freq: Four times a day (QID) | ORAL | Status: DC | PRN

## 2012-12-31 MED ORDER — NICOTINE 7 MG/24HR TD PT24
7.0000 mg | MEDICATED_PATCH | Freq: Every day | TRANSDERMAL | Status: DC
  Administered 2013-01-01 – 2013-01-06 (×7): 7 mg via TRANSDERMAL
  Filled 2012-12-31 (×7): qty 1

## 2012-12-31 MED ORDER — AMOXICILLIN 500 MG PO CAPS
500.0000 mg | ORAL_CAPSULE | Freq: Two times a day (BID) | ORAL | Status: DC
  Administered 2012-12-31: 500 mg via ORAL
  Filled 2012-12-31: qty 1

## 2012-12-31 MED ORDER — OXYCODONE HCL 5 MG PO TABS: 5.0000 mg | ORAL_TABLET | Freq: Three times a day (TID) | ORAL | Status: DC | PRN

## 2012-12-31 NOTE — ED Notes (Signed)
IV team paged. 2 nurses attempted w/o success.

## 2012-12-31 NOTE — ED Notes (Signed)
Pt states she started having a migraine yesterday and took 3 advil migraine w/ some relief but woke up this morning with bone pain, especially in her L arm and back, similar to sickle cell crisis. States she called her doc and they told her to come here bc of possible virus since pt is c/o sore throat as well. SOB but denies CP.

## 2012-12-31 NOTE — ED Provider Notes (Signed)
I saw and evaluated the patient, reviewed the resident's note and I agree with the findings and plan.   .Face to face Exam:  General:  Awake HEENT:  Atraumatic Resp:  Normal effort Abd:  Nondistended Neuro:No focal weakness  Nelia Shi, MD 12/31/12 2116

## 2012-12-31 NOTE — H&P (Signed)
[name] is an 22 y.o. female with a PMHx of sickle cell anemia, depression, folate deficiency, GERD, back pain, and MHA.    PCP: Dr. Ashley Royalty in [facility].   Chief Complaint: sorethroat, uncontrolled back and left arm pain  HPI: [name] is a 22 yo AAF who presented to the [facility] ED tonight with increased/uncontrolled back and left arm pain consistent with her usual sickle cell crisis pain. She also c/o sorethroat, HA, and nausea for 1 day. Pain goal at home is 3/10 and she can function normally at that level. She takes Oxycodone 5mg  and 15mg  alternating q6hrs at home. Last dose 11am today. Last refill 12/18/12 #60 each in sickle cell clinic. Last visit to clinic 12/10/12 and last overnight stay 11/20/12. Tonight, she rates her back and left arm pain as 8/10, throbbing, dull and constant. No other pain, including no chest pain.  In the ED, was found to have a positive rapid strep test and was given one dose of 500mg  Amoxicillin. Neg preg test. UA pending. CXR neg for acute issues/acute chest syndrome. WBCC elevated with low grade fever. Bili 1.8, retic count elevated at 326, Hgb 9.4 (baseline 8.8-10.9 in chart), and creatinine normal.  Therefore, because of the strep and sickle cell exacerbated pain/crisis, [facility] hospitalists are asked to admit pt for further management and evaluation.   Past Medical History  Diagnosis Date  . H/O: 1 miscarriage 03/22/2011  . 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 Depression 01/06/2011  . GERD (gastroesophageal reflux disease) 02/17/2011  . Trichotillomania     h/o  . Blood transfusion     "lots"  . Sickle cell anemia with crisis   . Exertional dyspnea     "sometimes"  . Sickle cell anemia   . Headache(784.0)   . Migraines 11/08/11    "@ least twice/month"  . Chronic back pain     "very severe; have knot in my back; from tight muscle; take RX and exercise for it"  . Mood swings 11/08/11    "I go back and forth; real bad"  . Pregnancy   . Blood  dyscrasia     SICKLE CELL    Past Surgical History  Procedure Laterality Date  . Cholecystectomy  05/2010  . Dilation and curettage of uterus  02/20/11    S/P miscarriage    Family History  Problem Relation Age of Onset  . Diabetes Mother   . Alcoholism    . Depression    . Hypercholesterolemia    . Hypertension    . Migraines    . Diabetes Maternal Grandmother   . Diabetes Paternal Grandmother    Social History:  reports that she has been smoking Cigarettes.  She has a .25 pack-year smoking history. She has never used smokeless tobacco. She reports that  drinks alcohol. She reports that she uses illicit drugs (Other-see comments).  Allergies:  Allergies  Allergen Reactions  . Carrot Oil Hives and Swelling  . Latex Rash    Medications Prior to Admission  Medication Sig Dispense Refill  . folic acid (FOLVITE) 800 MCG tablet Take 800 mcg by mouth every morning.      . hydroxyurea (HYDREA) 500 MG capsule Take 2 capsules (1,000 mg total) by mouth at bedtime. May take with food to minimize GI side effects.  60 capsule  3  . ibuprofen (ADVIL,MOTRIN) 600 MG tablet Take 1 tablet (600 mg total) by mouth every 6 (six) hours as needed for pain.  60 tablet  0  . sertraline (ZOLOFT) 50 MG tablet Take 1 tablet (50 mg total) by mouth daily.  30 tablet  3  . oxyCODONE (ROXICODONE) 15 MG immediate release tablet 15 mg every  q 6 hours PRN  60 tablet  0  . oxyCODONE (ROXICODONE) 5 MG immediate release tablet Take 1 tablet (5 mg total) by mouth every 8 (eight) hours as needed for pain.  60 tablet  0  . polyethylene glycol (MIRALAX / GLYCOLAX) packet Take 17 g by mouth daily.  30 each  0    Results for orders placed during the hospital encounter of 12/31/12 (from the past 48 hour(s))  CBC WITH DIFFERENTIAL     Status: Abnormal   Collection Time    12/31/12  4:06 PM      Result Value Range   WBC 26.2 (*) 4.0 - 10.5 K/uL   RBC 2.89 (*) 3.87 - 5.11 MIL/uL   Hemoglobin 9.4 (*) 12.0 - 15.0 g/dL    HCT 16.1 (*) 09.6 - 46.0 %   MCV 92.7  78.0 - 100.0 fL   MCH 32.5  26.0 - 34.0 pg   MCHC 35.1  30.0 - 36.0 g/dL   RDW 04.5 (*) 40.9 - 81.1 %   Platelets 505 (*) 150 - 400 K/uL   Neutrophils Relative % 87 (*) 43 - 77 %   Lymphocytes Relative 5 (*) 12 - 46 %   Monocytes Relative 7  3 - 12 %   Eosinophils Relative 1  0 - 5 %   Basophils Relative 0  0 - 1 %   Neutro Abs 22.8 (*) 1.7 - 7.7 K/uL   Lymphs Abs 1.3  0.7 - 4.0 K/uL   Monocytes Absolute 1.8 (*) 0.1 - 1.0 K/uL   Eosinophils Absolute 0.3  0.0 - 0.7 K/uL   Basophils Absolute 0.0  0.0 - 0.1 K/uL   RBC Morphology POLYCHROMASIA PRESENT     Comment: TARGET CELLS     SICKLE CELLS  RETICULOCYTES     Status: Abnormal   Collection Time    12/31/12  4:06 PM      Result Value Range   Retic Ct Pct 11.3 (*) 0.4 - 3.1 %   RBC. 2.89 (*) 3.87 - 5.11 MIL/uL   Retic Count, Manual 326.6 (*) 19.0 - 186.0 K/uL  COMPREHENSIVE METABOLIC PANEL     Status: Abnormal   Collection Time    12/31/12  4:06 PM      Result Value Range   Sodium 136  135 - 145 mEq/L   Potassium 3.5  3.5 - 5.1 mEq/L   Chloride 103  96 - 112 mEq/L   CO2 26  19 - 32 mEq/L   Glucose, Bld 92  70 - 99 mg/dL   BUN 4 (*) 6 - 23 mg/dL   Creatinine, Ser 9.14  0.50 - 1.10 mg/dL   Calcium 8.8  8.4 - 78.2 mg/dL   Total Protein 7.0  6.0 - 8.3 g/dL   Albumin 3.7  3.5 - 5.2 g/dL   AST 15  0 - 37 U/L   ALT 10  0 - 35 U/L   Alkaline Phosphatase 64  39 - 117 U/L   Total Bilirubin 1.8 (*) 0.3 - 1.2 mg/dL   GFR calc non Af Amer >90  >90 mL/min   GFR calc Af Amer >90  >90 mL/min   Comment: (NOTE)     The eGFR has been calculated using the CKD EPI equation.  This calculation has not been validated in all clinical situations.     eGFR's persistently <90 mL/min signify possible Chronic Kidney     Disease.  RAPID STREP SCREEN     Status: Abnormal   Collection Time    12/31/12  5:34 PM      Result Value Range   Streptococcus, Group A Screen (Direct) POSITIVE (*) NEGATIVE   URINALYSIS, ROUTINE W REFLEX MICROSCOPIC     Status: Abnormal   Collection Time    12/31/12  9:17 PM      Result Value Range   Color, Urine YELLOW  YELLOW   APPearance CLEAR  CLEAR   Specific Gravity, Urine 1.011  1.005 - 1.030   pH 7.5  5.0 - 8.0   Glucose, UA NEGATIVE  NEGATIVE mg/dL   Hgb urine dipstick TRACE (*) NEGATIVE   Bilirubin Urine NEGATIVE  NEGATIVE   Ketones, ur NEGATIVE  NEGATIVE mg/dL   Protein, ur NEGATIVE  NEGATIVE mg/dL   Urobilinogen, UA 1.0  0.0 - 1.0 mg/dL   Nitrite NEGATIVE  NEGATIVE   Leukocytes, UA NEGATIVE  NEGATIVE  URINE MICROSCOPIC-ADD ON     Status: None   Collection Time    12/31/12  9:17 PM      Result Value Range   Squamous Epithelial / LPF RARE  RARE   WBC, UA 0-2  <3 WBC/hpf   RBC / HPF 0-2  <3 RBC/hpf  POCT PREGNANCY, URINE     Status: None   Collection Time    12/31/12  9:30 PM      Result Value Range   Preg Test, Ur NEGATIVE  NEGATIVE   Comment:            THE SENSITIVITY OF THIS     METHODOLOGY IS >24 mIU/mL   Dg Chest 2 View  12/31/2012   CLINICAL DATA:  Sickle cell pain crisis. Sickle cell disease. Sore throat.  EXAM: CHEST  2 VIEW  COMPARISON:  10/05/2012  FINDINGS: The heart size and mediastinal contours are within normal limits. Both lungs are clear. Chronic skeletal changes of sickle cell disease are again noted.  IMPRESSION: No active cardiopulmonary disease.   Electronically Signed   By: Myles Rosenthal   On: 12/31/2012 17:04    Review of Systems  Constitutional: Positive for malaise/fatigue. Negative for fever, chills, weight loss and diaphoresis.  HENT: Positive for sore throat. Negative for hearing loss, ear pain, congestion, neck pain, tinnitus and ear discharge.   Eyes: Negative for blurred vision, double vision, pain and discharge.  Respiratory: Positive for cough (dry cough). Negative for sputum production, shortness of breath, wheezing and stridor.   Cardiovascular: Negative for chest pain, palpitations and PND.   Gastrointestinal: Positive for nausea. Negative for heartburn, vomiting, abdominal pain and diarrhea. Constipation: chronic.  Genitourinary: Negative for dysuria, urgency and frequency.  Musculoskeletal: Positive for back pain and joint pain (left arm). Negative for myalgias.  Skin: Negative for itching and rash.  Neurological: Positive for headaches. Negative for dizziness, tingling, sensory change, speech change, focal weakness and weakness.  Endo/Heme/Allergies: Negative for environmental allergies and polydipsia. Does not bruise/bleed easily.  Psychiatric/Behavioral: Negative for depression and suicidal ideas. The patient is not nervous/anxious.     Blood pressure 121/79, pulse 101, temperature 100.1 F (37.8 C), temperature source Oral, resp. rate 18, height 5\' 2"  (1.575 m), weight 84.6 kg (186 lb 8.2 oz), last menstrual period 12/02/2012, SpO2 99.00%. Physical Exam  Constitutional: She appears well-developed and well-nourished. No  distress.  Overweight, NAD, appears fairly well, grimacing in pain  HENT:  Head: Normocephalic and atraumatic.  Left Ear: External ear normal.  Nose: Nose normal.  Mouth/Throat: Oropharyngeal exudate (posterior pharynx erythemic with yellow exudate) present.  Eyes: Pupils are equal, round, and reactive to light. Right eye exhibits no discharge. Left eye exhibits no discharge. No scleral icterus.  Neck: Normal range of motion. Neck supple. No JVD present. No tracheal deviation present. No thyromegaly present.  Cardiovascular: Normal rate, regular rhythm, normal heart sounds and intact distal pulses.  Exam reveals no gallop and no friction rub.   No murmur heard. Respiratory: Breath sounds normal. No stridor. No respiratory distress. She has no wheezes. She has no rales. She exhibits no tenderness.  GI: Soft. Bowel sounds are normal. She exhibits no distension. There is no tenderness. There is no guarding.  Genitourinary:  deferred  Musculoskeletal: She  exhibits no edema. Tenderness: left arm.  No erythema of joints  Lymphadenopathy:    Cervical adenopathy: anterior cervical lymphadenopathy with tenderness sub mandibular.  Neurological: She is alert. No cranial nerve deficit.  Skin: Skin is warm and dry. No rash noted. She is not diaphoretic. No erythema. No pallor.  Psychiatric: She has a normal mood and affect.     Assessment/Plan 1. Sickle cell anemia with crisis-admit to med surg. Cont Oxy 15mg  po q6hrs. Will hydrate. Add Dilaudid prn and Toradol q8hrs. Cont hydroxyurea and folvite. Bili slightly elevated, retic count slightly elevated, Hgb stable. Recheck labs in am.  2. steptococcal pharyngitis-cont Amoxicillin, but will increase frequency to q8 hrs. Ice pack and cepacol for comfort.  3. Leukocytosis-likely strep plus stress demargination, recheck am. UA pending. CXR neg for acute issues. Cont abx. 4. Nausea-likely secondary to strep. Zofran prn. 5. Chronic back pain with exacerbation secondary to #1-pain meds, Kpad, up ad lib. 6. Depression-stable, cont home med 7. Tobacco abuse-counseled. Written cessation materials. 8. Hx marijuana use-says she quit 1.5 years ago. 9. Chronic constipation-cont Miralax. 10. VTE ppx-Lovenox. Early ambulation.   Case discussed with Dr. Lyda Perone of [facility] who agrees with plan and will cosign/edit note as needed.  Jimmye Norman 12/31/2012, 11:25 PM

## 2012-12-31 NOTE — ED Notes (Signed)
ROOM [id number] ASSIGNED@2220 

## 2012-12-31 NOTE — ED Provider Notes (Signed)
CSN: [id number]     Arrival date & time 12/31/12  1545 History     First MD Initiated Contact with Patient 12/31/12 1600     Chief Complaint  Patient presents with  . Sickle Cell Pain Crisis  . Sore Throat   (Consider location/radiation/quality/duration/timing/severity/associated sxs/prior Treatment) HPI 22 year old female with sickle cell anemia presents with pain crisis.  She reports that this morning she woke up with severe 10/10 lower back pain and left upper extremity pain.  She states that this is typical of her pain crises.  She took 15 mg of Oxycodone and subsequently called the sickle cell clinic.  She took another 15 mg of Oxycodone at ~ 11 am. The pain did not abate and the clinic recommended she come in for further evaluation.   She denies any associated fever, chills, chest pain.  She does endorse SOB, sore throat, cough and  Nausea/vomiting.  No sick contacts.  She does report that she had a recent URI, about 2 weeks ago.   Past Medical History  Diagnosis Date  . H/O: 1 miscarriage 03/22/2011  . 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 Depression 01/06/2011  . GERD (gastroesophageal reflux disease) 02/17/2011  . Trichotillomania     h/o  . Blood transfusion     "lots"  . Sickle cell anemia with crisis   . Exertional dyspnea     "sometimes"  . Sickle cell anemia   . Headache(784.0)   . Migraines 11/08/11    "@ least twice/month"  . Chronic back pain     "very severe; have knot in my back; from tight muscle; take RX and exercise for it"  . Mood swings 11/08/11    "I go back and forth; real bad"  . Pregnancy   . Blood dyscrasia     SICKLE CELL   Past Surgical History  Procedure Laterality Date  . Cholecystectomy  05/2010  . Dilation and curettage of uterus  02/20/11    S/P miscarriage   Family History  Problem Relation Age of Onset  . Diabetes Mother   . Alcoholism    . Depression    . Hypercholesterolemia    . Hypertension    . Migraines    . Diabetes Maternal  Grandmother   . Diabetes Paternal Grandmother    History  Substance Use Topics  . Smoking status: Former Smoker    Quit date: 06/17/2011  . Smokeless tobacco: Never Used  . Alcohol Use: No     Comment: pt states she quit in May 2013. She has been without marijuana, tobacco or ETOH for 1 year. She is enrolled in school   OB History   Grav Para Term Preterm Abortions TAB SAB Ect Mult Living   2    1  1         Obstetric Comments   Miscarried in October 2012 at about 7 weeks     Review of Systems  Constitutional: Negative for fever and chills.  HENT: Positive for sore throat and trouble swallowing.   Respiratory: Positive for cough.   Cardiovascular: Negative for chest pain.  Gastrointestinal: Positive for nausea and vomiting.  Genitourinary: Negative for difficulty urinating.  Musculoskeletal: Positive for back pain.  Skin: Negative for rash.    Allergies  Carrot oil and Latex  Home Medications   Current Outpatient Rx  Name  Route  Sig  Dispense  Refill  . folic acid (FOLVITE) 800 MCG tablet   Oral   Take 800 mcg by  mouth every morning.         . hydroxyurea (HYDREA) 500 MG capsule   Oral   Take 2 capsules (1,000 mg total) by mouth at bedtime. May take with food to minimize GI side effects.   60 capsule   3   . ibuprofen (ADVIL,MOTRIN) 600 MG tablet   Oral   Take 1 tablet (600 mg total) by mouth every 6 (six) hours as needed for pain.   60 tablet   0   . sertraline (ZOLOFT) 50 MG tablet   Oral   Take 1 tablet (50 mg total) by mouth daily.   30 tablet   3   . oxyCODONE (ROXICODONE) 15 MG immediate release tablet      15 mg every  q 6 hours PRN   60 tablet   0   . oxyCODONE (ROXICODONE) 5 MG immediate release tablet   Oral   Take 1 tablet (5 mg total) by mouth every 8 (eight) hours as needed for pain.   60 tablet   0   . polyethylene glycol (MIRALAX / GLYCOLAX) packet   Oral   Take 17 g by mouth daily.   30 each   0    BP 123/72  Pulse 98   Temp(Src) 98.9 F (37.2 C) (Oral)  SpO2 100%  LMP 12/02/2012 Physical Exam  Constitutional: She is oriented to person, place, and time. She appears well-developed.  Appears in moderate distress secondary to pain.  HENT:  Head: Normocephalic and atraumatic.  Tonsillar hypertrophy noted.  Erythema noted.  No tonsillar exudate.    Eyes: No scleral icterus.  Cardiovascular: Regular rhythm.   No murmur heard. Tachycardic.   Pulmonary/Chest: Effort normal and breath sounds normal. She has no wheezes. She has no rales.  Abdominal: Soft. She exhibits no distension. There is no tenderness.  Musculoskeletal: She exhibits no edema.  Lower back very tender to palpation.  Significantly decreased ROM.  Neurological: She is alert and oriented to person, place, and time.  Skin: Skin is warm and dry. No rash noted.    ED Course   Procedures (including critical care time)  Labs Reviewed  RAPID STREP SCREEN - Abnormal; Notable for the following:    Streptococcus, Group A Screen (Direct) POSITIVE (*)    All other components within normal limits  COMPREHENSIVE METABOLIC PANEL - Abnormal; Notable for the following:    BUN 4 (*)    Total Bilirubin 1.8 (*)    All other components within normal limits  CBC WITH DIFFERENTIAL  RETICULOCYTES   Dg Chest 2 View  12/31/2012   CLINICAL DATA:  Sickle cell pain crisis. Sickle cell disease. Sore throat.  EXAM: CHEST  2 VIEW  COMPARISON:  10/05/2012  FINDINGS: The heart size and mediastinal contours are within normal limits. Both lungs are clear. Chronic skeletal changes of sickle cell disease are again noted.  IMPRESSION: No active cardiopulmonary disease.   Electronically Signed   By: Myles Rosenthal   On: 12/31/2012 17:04   1. Sickle cell anemia with crisis     MDM  22 year old female with sickle cell anemia presents with acute pain crisis. - Will obtain CBC, CMP, Retic and Chest xray. - Will give IV fluids - NS @ 100 mL/hr.  IV Dilaudid for pain.    2000 - Patient continues to have pain.  Rapid strep screen positive. When remainder of labs return will call Hospitalist for admission.  2015 - CBC revealed elevated Retic and  WBC count.  Pain continues to be uncontrolled.  Hospitalist to admit.   Tommie Sams, DO 12/31/12 2055

## 2012-12-31 NOTE — ED Notes (Signed)
IV Team at bedside 

## 2012-12-31 NOTE — ED Notes (Signed)
Main lab called for blood. IV team recalled states she will be here ASAP.

## 2013-01-01 ENCOUNTER — Inpatient Hospital Stay (HOSPITAL_COMMUNITY): Payer: Medicare Other

## 2013-01-01 DIAGNOSIS — D5700 Hb-SS disease with crisis, unspecified: Principal | ICD-10-CM

## 2013-01-01 DIAGNOSIS — J020 Streptococcal pharyngitis: Secondary | ICD-10-CM

## 2013-01-01 DIAGNOSIS — E860 Dehydration: Secondary | ICD-10-CM

## 2013-01-01 LAB — COMPREHENSIVE METABOLIC PANEL
ALT: 9 U/L (ref 0–35)
AST: 14 U/L (ref 0–37)
Albumin: 3.7 g/dL (ref 3.5–5.2)
Alkaline Phosphatase: 70 U/L (ref 39–117)
Chloride: 101 mEq/L (ref 96–112)
Potassium: 3 mEq/L — ABNORMAL LOW (ref 3.5–5.1)
Sodium: 138 mEq/L (ref 135–145)
Total Bilirubin: 2.1 mg/dL — ABNORMAL HIGH (ref 0.3–1.2)

## 2013-01-01 LAB — DIFFERENTIAL
Basophils Absolute: 0 10*3/uL (ref 0.0–0.1)
Eosinophils Absolute: 0.8 10*3/uL — ABNORMAL HIGH (ref 0.0–0.7)
Lymphocytes Relative: 10 % — ABNORMAL LOW (ref 12–46)
Neutrophils Relative %: 81 % — ABNORMAL HIGH (ref 43–77)

## 2013-01-01 LAB — CBC
Platelets: 487 10*3/uL — ABNORMAL HIGH (ref 150–400)
RBC: 2.88 MIL/uL — ABNORMAL LOW (ref 3.87–5.11)
RDW: 18.3 % — ABNORMAL HIGH (ref 11.5–15.5)
WBC: 26.5 10*3/uL — ABNORMAL HIGH (ref 4.0–10.5)

## 2013-01-01 MED ORDER — HYDROMORPHONE HCL PF 2 MG/ML IJ SOLN
3.0000 mg | INTRAMUSCULAR | Status: DC
  Administered 2013-01-01 – 2013-01-05 (×44): 3 mg via INTRAVENOUS
  Filled 2013-01-01 (×43): qty 2

## 2013-01-01 MED ORDER — OXYCODONE HCL 15 MG PO TABS: ORAL_TABLET | ORAL | Status: DC

## 2013-01-01 MED ORDER — OXYCODONE HCL 5 MG PO TABS: 5.0000 mg | ORAL_TABLET | Freq: Four times a day (QID) | ORAL | Status: DC | PRN

## 2013-01-01 MED ORDER — ADULT MULTIVITAMIN W/MINERALS CH
1.0000 | ORAL_TABLET | Freq: Every day | ORAL | Status: DC
  Administered 2013-01-01 – 2013-01-06 (×6): 1 via ORAL
  Filled 2013-01-01 (×6): qty 1

## 2013-01-01 MED ORDER — IBUPROFEN 600 MG PO TABS
600.0000 mg | ORAL_TABLET | Freq: Four times a day (QID) | ORAL | Status: DC | PRN
  Filled 2013-01-01: qty 1

## 2013-01-01 MED ORDER — HYDROMORPHONE HCL PF 2 MG/ML IJ SOLN
2.0000 mg | INTRAMUSCULAR | Status: DC | PRN
  Administered 2013-01-01 (×4): 2 mg via INTRAVENOUS
  Filled 2013-01-01 (×4): qty 1

## 2013-01-01 MED ORDER — POTASSIUM CHLORIDE CRYS ER 20 MEQ PO TBCR
40.0000 meq | EXTENDED_RELEASE_TABLET | ORAL | Status: AC
  Administered 2013-01-01 (×2): 40 meq via ORAL
  Filled 2013-01-01 (×2): qty 2

## 2013-01-01 NOTE — Progress Notes (Signed)
Nutrition Brief Note  Patient identified on the Malnutrition Screening Tool (MST) Report  Wt Readings from Last 5 Encounters:  01/01/13 186 lb 8.2 oz (84.6 kg)  12/04/12 192 lb (87.091 kg)  11/20/12 185 lb (83.915 kg)  11/05/12 192 lb (87.091 kg)  10/29/12 191 lb (86.637 kg)    Body mass index is 34.1 kg/(m^2). Patient meets criteria for class I obesity based on current BMI.   Current diet order is regular, patient is consuming approximately 100% of meals at this time. Labs and medications reviewed. Potassium low, getting oral replacement, total bilirubin high. Met with pt who reports typically having a good appetite and intake, 3 meals/day, however was unable to eat anything for 2 days PTA r/t having sore throat from having strep throat. Pt reports weight has been stable. Pt reports throat is still sore however was able to eat 100% of meals. Pt c/o dry mouth.   No nutrition interventions warranted at this time other than ordering daily multivitamin. If nutrition issues arise, please consult RD.   Levon Hedger MS, RD, LDN [phone] Pager [phone] After Hours Pager

## 2013-01-01 NOTE — Progress Notes (Signed)
SICKLE CELL SERVICE PROGRESS NOTE  [name] XBJ:[id number] DOB: [birth year] DOA: 12/31/2012 PCP: Wilma Wuthrich A., MD  Assessment/Plan: Active Problems:   Streptococcal sore throat  1. Streptococcal Pharyngitist: Pt is on day #2 of amoxacillin and has already noticed improvement. 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 If she has any clinical deterioration, I will change to IV antibiotics. Continue amoxicillin and supportive care for now. 2. Hb SS with crisis: Pt give a history of dehydration and exertion in the process of celebrating her birthday. This along with the infection is likely the trigger for her crisis. I will increase her Dilaudid to 3 mg every 2 hours scheduled and continue hydration and Ketorolac. Monitor hydration status and renal function closely.  3.  Hypokalemia: Potassium at 3.0 will replace orally. 4. Leukocytosis: Related to infection. Will expect to see improvement as infection treated. 5. Anemia: Hb stable.  Code Status: Full Code Family Communication: N/A Disposition Plan: Home in 3-4 days  [name] A.  Pager [phone]. If 7PM-7AM, please contact night-coverage.  01/01/2013, 10:22 AM  LOS: 1 day   Brief narrative: Thisis a 22 yo AAF who presented to the [facility] ED tonight with c/o sorethroat, HA, and nausea for 1 day. She also c/o increased/uncontrolled back and left arm pain consistent with her usual sickle cell crisis pain.  Pain goal at home is 3/10 and she can function normally at that level. She takes Oxycodone 5mg  and 15mg  alternating q6hrs at home. Last dose 11am today. Last refill 12/18/12 #60 each in sickle cell clinic. Last visit to clinic 12/10/12 and last overnight stay 11/20/12. Tonight, she rates her back and left arm pain as 8/10, throbbing, dull and constant. No other pain, including no chest pain. In the ED, was found to have a positive rapid strep test and was given one dose of 500mg  Amoxicillin. Neg preg test. UA pending. CXR  neg for acute issues/acute chest syndrome. WBC elevated with low grade fever. Bili 1.8,  Hgb 9.4 (baseline 8.8-10.9 in chart), and creatinine normal.  Therefore, because of the strep and sickle cell exacerbated pain/crisis, [facility] are asked to admit pt for further management and evaluation   Consultants:  None  Procedures:  None  Antibiotics:  Amoxicillin 8/18 >>  HPI/Subjective: Pt states that she aches all over. She states that pain decreased from 8/10 to 6/10 with current regimen and relief lasts about 2 hours. She states that her throat feels better than it did yesterday.  Objective: Filed Vitals:   12/31/12 2315 01/01/13 0233 01/01/13 0624 01/01/13 1006  BP: 121/79 99/48 118/66 113/67  Pulse: 101 94 92 79  Temp: 100.1 F (37.8 C) 98.9 F (37.2 C) 99.1 F (37.3 C) 97.8 F (36.6 C)  TempSrc: Oral Oral Oral Oral  Resp: 18 16 18 18   Height: 5\' 2"  (1.575 m)     Weight: 186 lb 8.2 oz (84.6 kg)  186 lb 8.2 oz (84.6 kg)   SpO2: 99% 100% 98% 100%     General: Alert, awake, oriented x3, in no acute distress.  HEENT: [place]/AT PEERL, EOMI, anicteric OROPHARYNX:  Moist, No exudate/ erythema/lesions.  Heart: Regular rate and rhythm, without murmurs, rubs, gallops, PMI non-displaced, no heaves or thrills on palpation.  Lungs: Clear to auscultation, no wheezing or rhonchi noted.  Abdomen: Soft, nontender, nondistended, positive bowel sounds, no masses no hepatosplenomegaly noted.  Neuro: No focal neurological deficits noted cranial nerves II through XII grossly intact. Strength normal in bilateral upper and  lower extremities. Musculoskeletal: No warm swelling or erythema around joints, no spinal tenderness noted. Psychiatric: Patient alert and oriented x3, good insight and cognition, good recent to remote recall. Lymph node survey: No cervical axillary or inguinal lymphadenopathy noted.   Data Reviewed: Basic Metabolic Panel:  Recent Labs Lab 12/31/12 1606  01/01/13 0521  NA 136 138  K 3.5 3.0*  CL 103 101  CO2 26 25  GLUCOSE 92 89  BUN 4* 5*  CREATININE 0.59 0.59  CALCIUM 8.8 9.0   Liver Function Tests:  Recent Labs Lab 12/31/12 1606 01/01/13 0521  AST 15 14  ALT 10 9  ALKPHOS 64 70  BILITOT 1.8* 2.1*  PROT 7.0 7.1  ALBUMIN 3.7 3.7   CBC:  Recent Labs Lab 12/31/12 1606 01/01/13 0521  WBC 26.2* 26.5*  NEUTROABS 22.8* 21.4*  HGB 9.4* 9.5*  HCT 26.8* 27.0*  MCV 92.7 93.8  PLT 505* 487*     Recent Results (from the past 240 hour(s))  RAPID STREP SCREEN     Status: Abnormal   Collection Time    12/31/12  5:34 PM      Result Value Range Status   Streptococcus, Group A Screen (Direct) POSITIVE (*) NEGATIVE Final     Studies: Dg Chest 2 View  12/31/2012   CLINICAL DATA:  Sickle cell pain crisis. Sickle cell disease. Sore throat.  EXAM: CHEST  2 VIEW  COMPARISON:  10/05/2012  FINDINGS: The heart size and mediastinal contours are within normal limits. Both lungs are clear. Chronic skeletal changes of sickle cell disease are again noted.  IMPRESSION: No active cardiopulmonary disease.   Electronically Signed   By: Myles Rosenthal   On: 12/31/2012 17:04   Dg Chest Port 1 View  01/01/2013   *RADIOLOGY REPORT*  Clinical Data: Sickle cell, chest pain.  PORTABLE CHEST - 1 VIEW  Comparison: 12/31/2012.  Findings: Trachea is midline.  Heart size stable.  Mild accentuation of the pulmonary markings may be technical in etiology.  No focal airspace consolidation or pleural fluid. Sickle cell changes are seen in the humeral heads bilaterally.  IMPRESSION: Mild accentuation of pulmonary markings may be technical in etiology.  Difficult to exclude trace edema.   Original Report Authenticated By: Leanna Battles, M.D.    Scheduled Meds: . amoxicillin  500 mg Oral Q8H  . enoxaparin (LOVENOX) injection  40 mg Subcutaneous QHS  . folic acid  1 mg Oral Daily  .  HYDROmorphone (DILAUDID) injection  3 mg Intravenous Q2H  . hydroxyurea  1,000 mg  Oral QHS  . ketorolac  30 mg Intravenous Q8H  . nicotine  7 mg Transdermal Daily  . polyethylene glycol  17 g Oral Daily  . potassium chloride  40 mEq Oral Q2H  . sertraline  50 mg Oral Daily   Continuous Infusions: . dextrose 5 % and 0.45% NaCl 100 mL/hr at 01/01/13 0005    Time spent 37 minutes

## 2013-01-01 NOTE — Care Management Note (Signed)
   CARE MANAGEMENT NOTE 01/01/2013  Patient:  [name], [name]   Account Number:  [id number]  Date Initiated:  01/01/2013  Documentation initiated by:  Renlee Floor  Subjective/Objective Assessment:   22 yo female admitted with sickle cell crisis & strep throat. PCP: Dr. Marthann Schiller.     Action/Plan:   Home when stable   Anticipated DC Date:     Anticipated DC Plan:  HOME/SELF CARE      DC Planning Services  CM consult      Choice offered to / List presented to:  NA   DME arranged  NA      DME agency  NA     HH arranged  NA      HH agency  NA   Status of service:  In process, will continue to follow Medicare Important Message given?   (If response is "NO", the following Medicare IM given date fields will be blank) Date Medicare IM given:   Date Additional Medicare IM given:    Discharge Disposition:    Per UR Regulation:  Reviewed for med. necessity/level of care/duration of stay  If discussed at Long Length of Stay Meetings, dates discussed:    Comments:  01/01/13 1321 Mariesa Grieder,RN,MSN [phone] Chart reviewed for utilization of services. No needs identified. PCP: Dr.Michelle Ashley Royalty. Pt followed by [facility].

## 2013-01-01 NOTE — H&P (Signed)
Patient seen and examined, agree with above. 

## 2013-01-01 NOTE — Progress Notes (Signed)
Patient c/o chest pain around 0300 am this morning.  MD notified, EKG obtained.  MD was again notified with results of EKG.  Orders to increase frequency of pain medications and order for chest xray was received.  Will continue to monitor patient.[name]

## 2013-01-02 LAB — CBC WITH DIFFERENTIAL/PLATELET
Basophils Absolute: 0.1 10*3/uL (ref 0.0–0.1)
Eosinophils Relative: 3 % (ref 0–5)
Lymphocytes Relative: 11 % — ABNORMAL LOW (ref 12–46)
Neutro Abs: 17.7 10*3/uL — ABNORMAL HIGH (ref 1.7–7.7)
Platelets: 392 10*3/uL (ref 150–400)
RDW: 18.5 % — ABNORMAL HIGH (ref 11.5–15.5)
WBC: 22 10*3/uL — ABNORMAL HIGH (ref 4.0–10.5)

## 2013-01-02 LAB — COMPREHENSIVE METABOLIC PANEL
ALT: 21 U/L (ref 0–35)
AST: 61 U/L — ABNORMAL HIGH (ref 0–37)
Albumin: 3.7 g/dL (ref 3.5–5.2)
CO2: 26 mEq/L (ref 19–32)
Calcium: 9 mg/dL (ref 8.4–10.5)
Chloride: 103 mEq/L (ref 96–112)
GFR calc non Af Amer: >90 mL/min (ref >90)
Sodium: 137 mEq/L (ref 135–145)

## 2013-01-02 NOTE — Progress Notes (Signed)
Subjective: A 22 yo admitted with acute pharyngitis and sickle cell painful crisis. Patient has felt better since yesterday. Still has some sore throat as well as rhinorrhea. No fever, no chills. She is having 7/10 pain and will like to have a change in the dose of her dilaudid. Has been able to eat some liquid diet. Denies shortness of breath, no chest pain. Has some non-productive cough.  Objective: Vital signs in last 24 hours: Temp:  [97.6 F (36.4 C)-98.7 F (37.1 C)] 98.3 F (36.8 C) (08/20 1846) Pulse Rate:  [86-101] 93 (08/20 1846) Resp:  [18-20] 18 (08/20 1846) BP: (100-125)/(52-69) 122/60 mmHg (08/20 1846) SpO2:  [97 %-100 %] 98 % (08/20 1846) Weight change:  Last BM Date: 12/30/12  Intake/Output from previous day: 08/19 0701 - 08/20 0700 In: 440 [P.O.:180; I.V.:260] Out: 1 [Urine:1] Intake/Output this shift:    General appearance: alert, cooperative and no distress Head: Normocephalic, without obvious abnormality, atraumatic Nose: Nares normal. Septum midline. Mucosa normal. No drainage or sinus tenderness. Throat: lips, mucosa, and tongue normal; teeth and gums normal Neck: no adenopathy, no carotid bruit, no JVD, supple, symmetrical, trachea midline and thyroid not enlarged, symmetric, no tenderness/mass/nodules Resp: clear to auscultation bilaterally Chest wall: no tenderness Cardio: regular rate and rhythm, S1, S2 normal, no murmur, click, rub or gallop GI: soft, non-tender; bowel sounds normal; no masses,  no organomegaly Extremities: extremities normal, atraumatic, no cyanosis or edema Pulses: 2+ and symmetric Neurologic: Grossly normal  Lab Results:  Recent Labs  01/01/13 0521 01/02/13 0400  WBC 26.5* 22.0*  HGB 9.5* 9.0*  HCT 27.0* 26.4*  PLT 487* 392   BMET  Recent Labs  01/01/13 0521 01/02/13 0400  NA 138 137  K 3.0* 5.5*  CL 101 103  CO2 25 26  GLUCOSE 89 103*  BUN 5* 9  CREATININE 0.59 0.61  CALCIUM 9.0 9.0    Studies/Results: Dg  Chest Port 1 View  01/01/2013   *RADIOLOGY REPORT*  Clinical Data: Sickle cell, chest pain.  PORTABLE CHEST - 1 VIEW  Comparison: 12/31/2012.  Findings: Trachea is midline.  Heart size stable.  Mild accentuation of the pulmonary markings may be technical in etiology.  No focal airspace consolidation or pleural fluid. Sickle cell changes are seen in the humeral heads bilaterally.  IMPRESSION: Mild accentuation of pulmonary markings may be technical in etiology.  Difficult to exclude trace edema.   Original Report Authenticated By: Leanna Battles, M.D.    Medications: I have reviewed the patient's current medications.  Assessment/Plan: A 22 yo woman with sickle cell disease and acute pharyngitis.  #1 Acute Strep Pharyngitis: Seems getting better on antibiotics and supportive care. Will continue. Advance diet as tolerated.  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 Continue monitoring and current pain control Patient aware that we will not escalate her Dilaudid dose.  #3 Tobacco Abuse: counseled. On Nicotine patch.  #4 Hyperkalemia: Due to replacement of potassium. Was 3.0 yesterday now 5.5  LOS: 2 days   GARBA,LAWAL 01/02/2013, 7:27 PM

## 2013-01-03 ENCOUNTER — Encounter: Payer: Self-pay | Admitting: Internal Medicine

## 2013-01-03 DIAGNOSIS — F17200 Nicotine dependence, unspecified, uncomplicated: Secondary | ICD-10-CM

## 2013-01-03 LAB — CBC WITH DIFFERENTIAL/PLATELET
Basophils Relative: 1 % (ref 0–1)
Eosinophils Absolute: 0.7 10*3/uL (ref 0.0–0.7)
Eosinophils Relative: 4 % (ref 0–5)
Hemoglobin: 7.9 g/dL — ABNORMAL LOW (ref 12.0–15.0)
MCH: 32.4 pg (ref 26.0–34.0)
MCHC: 35 g/dL (ref 30.0–36.0)
MCV: 92.6 fL (ref 78.0–100.0)
Monocytes Absolute: 1.7 10*3/uL — ABNORMAL HIGH (ref 0.1–1.0)
Monocytes Relative: 11 % (ref 3–12)
Neutrophils Relative %: 67 % (ref 43–77)

## 2013-01-03 LAB — COMPREHENSIVE METABOLIC PANEL
Albumin: 3.6 g/dL (ref 3.5–5.2)
BUN: 9 mg/dL (ref 6–23)
Calcium: 9 mg/dL (ref 8.4–10.5)
Creatinine, Ser: 0.62 mg/dL (ref 0.50–1.10)
GFR calc Af Amer: >90 mL/min (ref >90)
Glucose, Bld: 98 mg/dL (ref 70–99)
Total Protein: 7.2 g/dL (ref 6.0–8.3)

## 2013-01-03 MED ORDER — NALOXONE HCL 0.4 MG/ML IJ SOLN: 0.4000 mg | INTRAMUSCULAR | Status: DC | PRN

## 2013-01-03 MED ORDER — SODIUM CHLORIDE 0.9 % IJ SOLN: 9.0000 mL | INTRAMUSCULAR | Status: DC | PRN

## 2013-01-03 MED ORDER — HYDROMORPHONE 0.3 MG/ML IV SOLN
INTRAVENOUS | Status: DC
  Administered 2013-01-03 (×2): via INTRAVENOUS
  Administered 2013-01-03: 5.6 mg via INTRAVENOUS
  Administered 2013-01-03: 1.93 mg via INTRAVENOUS
  Administered 2013-01-04: 2.4 mg via INTRAVENOUS
  Administered 2013-01-04: 0.3 mg via INTRAVENOUS
  Administered 2013-01-04: 09:00:00 via INTRAVENOUS
  Administered 2013-01-04: 2.1 mg via INTRAVENOUS
  Administered 2013-01-04: 3.24 mg via INTRAVENOUS
  Administered 2013-01-04: 0.3 mg via INTRAVENOUS
  Administered 2013-01-04: 5.4 mg via INTRAVENOUS
  Administered 2013-01-04: 3 mg via INTRAVENOUS
  Administered 2013-01-05: 2.18 mg via INTRAVENOUS
  Administered 2013-01-05: 2.4 mg via INTRAVENOUS
  Administered 2013-01-05: 1.8 mg via INTRAVENOUS
  Administered 2013-01-05: 13:00:00 via INTRAVENOUS
  Administered 2013-01-05: 2.1 mg via INTRAVENOUS
  Filled 2013-01-03 (×5): qty 25

## 2013-01-03 MED ORDER — LACTULOSE 10 GM/15ML PO SOLN
30.0000 g | Freq: Every day | ORAL | Status: DC | PRN
  Administered 2013-01-04: 30 g via ORAL
  Filled 2013-01-03 (×2): qty 60

## 2013-01-03 NOTE — Progress Notes (Signed)
SICKLE CELL SERVICE PROGRESS NOTE  RAAHI KORBER JYN:[id number] DOB: [birth year] DOA: 12/31/2012 PCP: MATTHEWS,MICHELLE A., MD  Assessment/Plan: Active Problems:  1. Streptococcal Pharyngitist: Pt is on day #4 of amoxacillin and continues to have improvement in clinical symptoms.  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 If she has any clinical deterioration, I will change to IV antibiotics. Continue amoxicillin and supportive care for now. 2. Hb SS with crisis: Pt states that pain is down to 6/10 with medication but only last for 1 hour. Will add PCA and plan to decrease dose of Clinician assisted doses as pain decreases. 3.  Hypokalemia: Corrected. Potassium now within normal range. 4. Leukocytosis: Improved with treatment of infection. 5. Anemia: Hb stable. 6. Tobacco Use disorder: Nicotine patch. Pt counseled on cessation.Pt want to quit. 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 She has been offered counseling in the past. Will ask Psychiatry to s  Code Status: Full Code Family Communication: N/A Disposition Plan: Home in 1-2 days  MATTHEWS,MICHELLE A.  Pager [phone]. If 7PM-7AM, please contact night-coverage.  01/03/2013, 12:21 PM  LOS: 3 days   Brief narrative: Thisis a 22 yo AAF who presented to the [facility] ED tonight with c/o sorethroat, HA, and nausea for 1 day. She also c/o increased/uncontrolled back and left arm pain consistent with her usual sickle cell crisis pain.  Pain goal at home is 3/10 and she can function normally at that level. She takes Oxycodone 5mg  and 15mg  alternating q6hrs at home. Last dose 11am today. Last refill 12/18/12 #60 each in sickle cell clinic. Last visit to clinic 12/10/12 and last overnight stay 11/20/12. Tonight, she rates her back and left arm pain as 8/10, throbbing, dull and constant. No other pain, including no chest pain. In the ED, was found to have a positive rapid strep test and was given one dose of 500mg   Amoxicillin. Neg preg test. UA pending. CXR neg for acute issues/acute chest syndrome. WBC elevated with low grade fever. Bili 1.8,  Hgb 9.4 (baseline 8.8-10.9 in chart), and creatinine normal.  Therefore, because of the strep and sickle cell exacerbated pain/crisis, [facility] hospitalists are asked to admit pt for further management and evaluation   Consultants:  None  Procedures:  None  Antibiotics:  Amoxicillin 8/18 >>  HPI/Subjective: Pt states that she aches all over. She states that pain decreased from 8/10 to 6/10 with current regimen and relief lasts about 2 hours. She states that her throat feels better than it did yesterday.  Objective: Filed Vitals:   01/02/13 1846 01/02/13 2205 01/03/13 0209 01/03/13 0600  BP: 122/60 117/63 134/61 121/68  Pulse: 93 91 111 103  Temp: 98.3 F (36.8 C) 98.9 F (37.2 C) 98.7 F (37.1 C) 99.2 F (37.3 C)  TempSrc: Oral Oral Oral Oral  Resp: 18 16 16 16   Height:      Weight:   190 lb 7.6 oz (86.4 kg)   SpO2: 98% 99% 100% 100%     General: Alert, awake, oriented x3, in no acute distress.  HEENT: [place]/AT PEERL, EOMI, anicteric OROPHARYNX:  Moist, No exudate/ erythema/lesions.  Heart: Regular rate and rhythm, without murmurs, rubs, gallops, PMI non-displaced, no heaves or thrills on palpation.  Lungs: Clear to auscultation, no wheezing or rhonchi noted.  Abdomen: Soft, nontender, nondistended, positive bowel sounds, no masses no hepatosplenomegaly noted.  Neuro: No focal neurological deficits noted cranial nerves II through XII grossly intact. Strength normal in bilateral upper and  lower extremities. Musculoskeletal: No warm swelling or erythema around joints, no spinal tenderness noted. Psychiatric: Patient alert and oriented x3, good insight and cognition, good recent to remote recall.    Data Reviewed: Basic Metabolic Panel:  Recent Labs Lab 12/31/12 1606 01/01/13 0521 01/02/13 0400 01/03/13 0935  NA 136 138 137 138  K 3.5  3.0* 5.5* 4.1  CL 103 101 103 102  CO2 26 25 26 28   GLUCOSE 92 89 103* 98  BUN 4* 5* 9 9  CREATININE 0.59 0.59 0.61 0.62  CALCIUM 8.8 9.0 9.0 9.0   Liver Function Tests:  Recent Labs Lab 12/31/12 1606 01/01/13 0521 01/02/13 0400 01/03/13 0935  AST 15 14 61* 39*  ALT 10 9 21  40*  ALKPHOS 64 70 113 83  BILITOT 1.8* 2.1* 2.6* 2.4*  PROT 7.0 7.1 7.3 7.2  ALBUMIN 3.7 3.7 3.7 3.6   CBC:  Recent Labs Lab 12/31/12 1606 01/01/13 0521 01/02/13 0400 01/03/13 0349  WBC 26.2* 26.5* 22.0* 16.5*  NEUTROABS 22.8* 21.4* 17.7* 11.0*  HGB 9.4* 9.5* 9.0* 7.9*  HCT 26.8* 27.0* 26.4* 22.6*  MCV 92.7 93.8 94.0 92.6  PLT 505* 487* 392 427*     Recent Results (from the past 240 hour(s))  RAPID STREP SCREEN     Status: Abnormal   Collection Time    12/31/12  5:34 PM      Result Value Range Status   Streptococcus, Group A Screen (Direct) POSITIVE (*) NEGATIVE Final     Studies: Dg Chest 2 View  12/31/2012   CLINICAL DATA:  Sickle cell pain crisis. Sickle cell disease. Sore throat.  EXAM: CHEST  2 VIEW  COMPARISON:  10/05/2012  FINDINGS: The heart size and mediastinal contours are within normal limits. Both lungs are clear. Chronic skeletal changes of sickle cell disease are again noted.  IMPRESSION: No active cardiopulmonary disease.   Electronically Signed   By: Myles Rosenthal   On: 12/31/2012 17:04   Dg Chest Port 1 View  01/01/2013   *RADIOLOGY REPORT*  Clinical Data: Sickle cell, chest pain.  PORTABLE CHEST - 1 VIEW  Comparison: 12/31/2012.  Findings: Trachea is midline.  Heart size stable.  Mild accentuation of the pulmonary markings may be technical in etiology.  No focal airspace consolidation or pleural fluid. Sickle cell changes are seen in the humeral heads bilaterally.  IMPRESSION: Mild accentuation of pulmonary markings may be technical in etiology.  Difficult to exclude trace edema.   Original Report Authenticated By: Leanna Battles, M.D.    Scheduled Meds: . amoxicillin  500  mg Oral Q8H  . enoxaparin (LOVENOX) injection  40 mg Subcutaneous QHS  . folic acid  1 mg Oral Daily  .  HYDROmorphone (DILAUDID) injection  3 mg Intravenous Q2H  . HYDROmorphone PCA 0.3 mg/mL   Intravenous Q4H  . hydroxyurea  1,000 mg Oral QHS  . ketorolac  30 mg Intravenous Q8H  . multivitamin with minerals  1 tablet Oral Daily  . nicotine  7 mg Transdermal Daily  . polyethylene glycol  17 g Oral Daily  . sertraline  50 mg Oral Daily   Continuous Infusions: . dextrose 5 % and 0.45% NaCl 125 mL/hr at 01/03/13 1102    Time spent 42 minutes

## 2013-01-04 LAB — CBC WITH DIFFERENTIAL/PLATELET
Eosinophils Relative: 6 % — ABNORMAL HIGH (ref 0–5)
HCT: 20.8 % — ABNORMAL LOW (ref 36.0–46.0)
Lymphocytes Relative: 22 % (ref 12–46)
Lymphs Abs: 3.4 10*3/uL (ref 0.7–4.0)
MCV: 92 fL (ref 78.0–100.0)
Monocytes Relative: 11 % (ref 3–12)
Neutro Abs: 9.2 10*3/uL — ABNORMAL HIGH (ref 1.7–7.7)
RBC: 2.26 MIL/uL — ABNORMAL LOW (ref 3.87–5.11)
WBC: 15.4 10*3/uL — ABNORMAL HIGH (ref 4.0–10.5)

## 2013-01-04 LAB — COMPREHENSIVE METABOLIC PANEL
ALT: 41 U/L — ABNORMAL HIGH (ref 0–35)
Albumin: 3.6 g/dL (ref 3.5–5.2)
Alkaline Phosphatase: 73 U/L (ref 39–117)
BUN: 10 mg/dL (ref 6–23)
Chloride: 101 mEq/L (ref 96–112)
Glucose, Bld: 104 mg/dL — ABNORMAL HIGH (ref 70–99)
Potassium: 3.8 mEq/L (ref 3.5–5.1)
Sodium: 135 mEq/L (ref 135–145)
Total Bilirubin: 2 mg/dL — ABNORMAL HIGH (ref 0.3–1.2)
Total Protein: 6.8 g/dL (ref 6.0–8.3)

## 2013-01-04 NOTE — Progress Notes (Signed)
Subjective: Patient seen, currently states her pain is 7/10 which is unchanged from yesterday, however, she states that when she has pain medication her pain goes down to 5/10. Patient used aa total of 19.36 mg of Dilaudid in the last 12 hours. Her PCA pump was started at 7 PM last night, and she initially used the PCA but slept very soundly and did not use a since approximately midnight last was in a there is a night. She additionally states her her 5 and 7 PM doses of Dilaudid. Patient states that solid because she across the last few nights due to pain. She states her pain is localized in the lower back, this is the same pain she's had since admission. Patient states her throat discomfort is much improved,she is swallowing without difficulty and had a full dinner last night.   Objective: Weight change:   Intake/Output Summary (Last 24 hours) at 01/04/13 0843 Last data filed at 01/04/13 0400  Gross per 24 hour  Intake   1590 ml  Output   1400 ml  Net    190 ml   BP 107/56  Pulse 102  Temp(Src) 98.3 F (36.8 C) (Oral)  Resp 12  Ht 5\' 2"  (1.575 m)  Wt 86.4 kg (190 lb 7.6 oz)  BMI 34.83 kg/m2  SpO2 99%  LMP 12/02/2012  General Appearance:    Alert, cooperative, no distress, appears stated age  Head:    Normocephalic, without obvious abnormality, atraumatic  Eyes:    PERRL, conjunctiva/corneas clear     Nose:   Nares normal, septum midline, mucosa normal, no drainage    or sinus tenderness  Throat:   Lips, mucosa, and tongue normal; teeth and gums normal  Neck:   Supple, symmetrical, trachea midline, no adenopathy;    thyroid:  no enlargement/tenderness/nodules; no carotid   bruit or JVD  Back:     Symmetric, no curvature, ROM normal, no CVA tenderness  Lungs:     Clear to auscultation bilaterally, respirations unlabored  Chest Wall:    No tenderness or deformity   Heart:    Regular rate and rhythm, S1 and S2 normal, no murmur, rub   or gallop     Abdomen:     Soft, non-tender,  bowel sounds active all four quadrants,    no masses, no organomegaly        Extremities:   Extremities normal, atraumatic, no cyanosis or edema  Pulses:   2+ and symmetric all extremities  Skin:   Skin color, texture, turgor normal, no rashes or lesions  Lymph nodes:   Cervical, supraclavicular, and axillary nodes normal  Neurologic:   CNII-XII intact, normal strength, sensation and reflexes    throughout    Lab Results:  Recent Labs  01/03/13 0935 01/04/13 0400  NA 138 135  K 4.1 3.8  CL 102 101  CO2 28 28  GLUCOSE 98 104*  BUN 9 10  CREATININE 0.62 0.65  CALCIUM 9.0 8.9    Recent Labs  01/03/13 0935 01/04/13 0400  AST 39* 34  ALT 40* 41*  ALKPHOS 83 73  BILITOT 2.4* 2.0*  PROT 7.2 6.8  ALBUMIN 3.6 3.6   No results found for this basename: LIPASE, AMYLASE,  in the last 72 hours  Recent Labs  01/03/13 0349 01/04/13 0400  WBC 16.5* 15.4*  NEUTROABS 11.0* 9.2*  HGB 7.9* 7.4*  HCT 22.6* 20.8*  MCV 92.6 92.0  PLT 427* 417*   No results found for this basename:  CKTOTAL, CKMB, CKMBINDEX, TROPONINI,  in the last 72 hours No components found with this basename: POCBNP,  No results found for this basename: DDIMER,  in the last 72 hours No results found for this basename: HGBA1C,  in the last 72 hours No results found for this basename: CHOL, HDL, LDLCALC, TRIG, CHOLHDL, LDLDIRECT,  in the last 72 hours No results found for this basename: TSH, T4TOTAL, FREET3, T3FREE, THYROIDAB,  in the last 72 hours No results found for this basename: VITAMINB12, FOLATE, FERRITIN, TIBC, IRON, RETICCTPCT,  in the last 72 hours  Micro Results: Recent Results (from the past 240 hour(s))  RAPID STREP SCREEN     Status: Abnormal   Collection Time    12/31/12  5:34 PM      Result Value Range Status   Streptococcus, Group A Screen (Direct) POSITIVE (*) NEGATIVE Final    Studies/Results: Dg Chest 2 View  12/31/2012   CLINICAL DATA:  Sickle cell pain crisis. Sickle cell disease.  Sore throat.  EXAM: CHEST  2 VIEW  COMPARISON:  10/05/2012  FINDINGS: The heart size and mediastinal contours are within normal limits. Both lungs are clear. Chronic skeletal changes of sickle cell disease are again noted.  IMPRESSION: No active cardiopulmonary disease.   Electronically Signed   By: Myles Rosenthal   On: 12/31/2012 17:04   Dg Chest Port 1 View  01/01/2013   *RADIOLOGY REPORT*  Clinical Data: Sickle cell, chest pain.  PORTABLE CHEST - 1 VIEW  Comparison: 12/31/2012.  Findings: Trachea is midline.  Heart size stable.  Mild accentuation of the pulmonary markings may be technical in etiology.  No focal airspace consolidation or pleural fluid. Sickle cell changes are seen in the humeral heads bilaterally.  IMPRESSION: Mild accentuation of pulmonary markings may be technical in etiology.  Difficult to exclude trace edema.   Original Report Authenticated By: Leanna Battles, M.D.   Medications: Scheduled Meds: . amoxicillin  500 mg Oral Q8H  . enoxaparin (LOVENOX) injection  40 mg Subcutaneous QHS  . folic acid  1 mg Oral Daily  .  HYDROmorphone (DILAUDID) injection  3 mg Intravenous Q2H  . HYDROmorphone PCA 0.3 mg/mL   Intravenous Q4H  . hydroxyurea  1,000 mg Oral QHS  . ketorolac  30 mg Intravenous Q8H  . multivitamin with minerals  1 tablet Oral Daily  . nicotine  7 mg Transdermal Daily  . polyethylene glycol  17 g Oral Daily  . sertraline  50 mg Oral Daily   Continuous Infusions: . dextrose 5 % and 0.45% NaCl 125 mL/hr at 01/03/13 1102   PRN Meds:.[START ON 01/06/2013] ibuprofen, lactulose, menthol-cetylpyridinium, naloxone, ondansetron, sodium chloride  Assessment/Plan: @PROBHOSP @  LOS: 4 days   Active Problems:  1. Streptococcal Pharyngitist: Pt is on day #5 of amoxacillin and continues  improve.  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 If she has any clinical deterioration, I will change to IV antibiotics.  2. Hb SS with crisis: Pain does appear improved but  difficult to assess based on how some the patient slept last night. Currently patient's pain is 7/10 will not change the settings on PCA pump, will reassess in a.m.  3. Hypokalemia: Corrected. monitor.  4. Leukocytosis: Improved. 5. Anemia: Hb stable. 6. Tobacco Use disorder: Nicotine patch. Pt counseled on cessation. Pt want to quit. 7. Trichotillomania:    Code Status: Full Code  Family Communication: N/A  Disposition Plan: Home in 1-2 days   Cruz Bong 01/04/2013, 8:43  AM

## 2013-01-05 LAB — BASIC METABOLIC PANEL
BUN: 8 mg/dL (ref 6–23)
Calcium: 9 mg/dL (ref 8.4–10.5)
Chloride: 101 mEq/L (ref 96–112)
Creatinine, Ser: 0.68 mg/dL (ref 0.50–1.10)
GFR calc Af Amer: >90 mL/min (ref >90)
GFR calc non Af Amer: >90 mL/min (ref >90)

## 2013-01-05 LAB — CBC
HCT: 20.9 % — ABNORMAL LOW (ref 36.0–46.0)
MCHC: 34.9 g/dL (ref 30.0–36.0)
MCV: 92.9 fL (ref 78.0–100.0)
Platelets: 436 10*3/uL — ABNORMAL HIGH (ref 150–400)
RDW: 20 % — ABNORMAL HIGH (ref 11.5–15.5)
WBC: 14.2 10*3/uL — ABNORMAL HIGH (ref 4.0–10.5)

## 2013-01-05 MED ORDER — POLYETHYLENE GLYCOL 3350 17 G PO PACK
17.0000 g | PACK | Freq: Once | ORAL | Status: AC
  Administered 2013-01-05: 17 g via ORAL
  Filled 2013-01-05: qty 1

## 2013-01-05 MED ORDER — HYDROMORPHONE HCL PF 2 MG/ML IJ SOLN
3.0000 mg | INTRAMUSCULAR | Status: DC | PRN
  Administered 2013-01-05 – 2013-01-06 (×6): 3 mg via INTRAVENOUS
  Administered 2013-01-06: 2 mg via INTRAVENOUS
  Administered 2013-01-06 (×2): 3 mg via INTRAVENOUS
  Filled 2013-01-05 (×9): qty 2

## 2013-01-05 NOTE — Progress Notes (Signed)
Subjective: Patient seen, states it improves down to 5/10. She she is moving better. States her throat discomfort is improved and she is moving well.  Objective: Weight change:   Intake/Output Summary (Last 24 hours) at 01/05/13 0946 Last data filed at 01/05/13 1610  Gross per 24 hour  Intake    480 ml  Output   1850 ml  Net  -1370 ml   BP 122/60  Pulse 111  Temp(Src) 98.1 F (36.7 C) (Oral)  Resp 20  Ht 5\' 2"  (1.575 m)  Wt 88 kg (194 lb 0.1 oz)  BMI 35.47 kg/m2  SpO2 95%  LMP 12/02/2012  General Appearance:    Alert, cooperative, no distress, appears stated age  Head:    Normocephalic, without obvious abnormality, atraumatic  Eyes:    PERRL, conjunctiva/corneas clear,     Nose:   Nares normal, septum midline, mucosa normal, no drainage    or sinus tenderness  Throat:   Lips, mucosa, and tongue normal; teeth and gums normal  Neck:   Supple, symmetrical, trachea midline, no adenopathy;    thyroid:  no enlargement/tenderness/nodules; no carotid   bruit or JVD  Back:     Symmetric, no curvature, ROM normal, no CVA tenderness  Lungs:     Clear to auscultation bilaterally, respirations unlabored  Chest Wall:    No tenderness or deformity   Heart:    Regular rate and rhythm, S1 and S2 normal, no murmur, rub   or gallop     Abdomen:     Soft, non-tender, bowel sounds active all four quadrants,    no masses, no organomegaly        Extremities:   Extremities normal, atraumatic, no cyanosis or edema  Pulses:   2+ and symmetric all extremities  Skin:   Skin color, texture, turgor normal, no rashes or lesions  Lymph nodes:   Cervical, supraclavicular, and axillary nodes normal  Neurologic:   CNII-XII intact, normal strength, sensation and reflexes    throughout    Lab Results:  Recent Labs  01/04/13 0400 01/05/13 0445  NA 135 135  K 3.8 4.7  CL 101 101  CO2 28 27  GLUCOSE 104* 113*  BUN 10 8  CREATININE 0.65 0.68  CALCIUM 8.9 9.0    Recent Labs  01/03/13 0935  01/04/13 0400  AST 39* 34  ALT 40* 41*  ALKPHOS 83 73  BILITOT 2.4* 2.0*  PROT 7.2 6.8  ALBUMIN 3.6 3.6   No results found for this basename: LIPASE, AMYLASE,  in the last 72 hours  Recent Labs  01/03/13 0349 01/04/13 0400 01/05/13 0445  WBC 16.5* 15.4* 14.2*  NEUTROABS 11.0* 9.2*  --   HGB 7.9* 7.4* 7.3*  HCT 22.6* 20.8* 20.9*  MCV 92.6 92.0 92.9  PLT 427* 417* 436*   No results found for this basename: CKTOTAL, CKMB, CKMBINDEX, TROPONINI,  in the last 72 hours No components found with this basename: POCBNP,  No results found for this basename: DDIMER,  in the last 72 hours No results found for this basename: HGBA1C,  in the last 72 hours No results found for this basename: CHOL, HDL, LDLCALC, TRIG, CHOLHDL, LDLDIRECT,  in the last 72 hours No results found for this basename: TSH, T4TOTAL, FREET3, T3FREE, THYROIDAB,  in the last 72 hours No results found for this basename: VITAMINB12, FOLATE, FERRITIN, TIBC, IRON, RETICCTPCT,  in the last 72 hours  Micro Results: Recent Results (from the past 240 hour(s))  RAPID STREP  SCREEN     Status: Abnormal   Collection Time    12/31/12  5:34 PM      Result Value Range Status   Streptococcus, Group A Screen (Direct) POSITIVE (*) NEGATIVE Final    Studies/Results: Dg Chest 2 View  12/31/2012   CLINICAL DATA:  Sickle cell pain crisis. Sickle cell disease. Sore throat.  EXAM: CHEST  2 VIEW  COMPARISON:  10/05/2012  FINDINGS: The heart size and mediastinal contours are within normal limits. Both lungs are clear. Chronic skeletal changes of sickle cell disease are again noted.  IMPRESSION: No active cardiopulmonary disease.   Electronically Signed   By: Myles Rosenthal   On: 12/31/2012 17:04   Dg Chest Port 1 View  01/01/2013   *RADIOLOGY REPORT*  Clinical Data: Sickle cell, chest pain.  PORTABLE CHEST - 1 VIEW  Comparison: 12/31/2012.  Findings: Trachea is midline.  Heart size stable.  Mild accentuation of the pulmonary markings may be  technical in etiology.  No focal airspace consolidation or pleural fluid. Sickle cell changes are seen in the humeral heads bilaterally.  IMPRESSION: Mild accentuation of pulmonary markings may be technical in etiology.  Difficult to exclude trace edema.   Original Report Authenticated By: Leanna Battles, M.D.   Medications: Scheduled Meds: . amoxicillin  500 mg Oral Q8H  . enoxaparin (LOVENOX) injection  40 mg Subcutaneous QHS  . folic acid  1 mg Oral Daily  . HYDROmorphone PCA 0.3 mg/mL   Intravenous Q4H  . hydroxyurea  1,000 mg Oral QHS  . ketorolac  30 mg Intravenous Q8H  . multivitamin with minerals  1 tablet Oral Daily  . nicotine  7 mg Transdermal Daily  . polyethylene glycol  17 g Oral Daily  . sertraline  50 mg Oral Daily   Continuous Infusions:  PRN Meds:.HYDROmorphone (DILAUDID) injection, [START ON 01/06/2013] ibuprofen, lactulose, menthol-cetylpyridinium, naloxone, ondansetron, sodium chloride  Assessment/Plan: @PROBHOSP @  LOS: 5 days   Active Problems:  1. Streptococcal Pharyngitist: Pt is on day 6/10 of amoxacillin and continues improve. 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 If she has any clinical deterioration, I will change to IV antibiotics.  2. Hb SS with crisis: Pain does appear improved, will DC  PCA pump, for discharge in a.m..  3. Hypokalemia: Corrected. monitor.  4. Leukocytosis: Improved. 5. Anemia: Hb stable. 6. Tobacco Use disorder: Nicotine patch. Pt counseled on cessation. Pt want to quit. 7. Trichotillomania:    Code Status: Full Code  Family Communication: N/A  Disposition Plan: Home in a.m.    Sherryl Valido 01/05/2013, 9:46 AM

## 2013-01-06 LAB — CBC
Hemoglobin: 8.1 g/dL — ABNORMAL LOW (ref 12.0–15.0)
MCHC: 34.3 g/dL (ref 30.0–36.0)
MCHC: 34 g/dL (ref 30.0–36.0)
Platelets: 430 10*3/uL — ABNORMAL HIGH (ref 150–400)
RDW: 21.8 % — ABNORMAL HIGH (ref 11.5–15.5)
RDW: 22.4 % — ABNORMAL HIGH (ref 11.5–15.5)
WBC: 14.5 10*3/uL — ABNORMAL HIGH (ref 4.0–10.5)
WBC: 13.6 10*3/uL — ABNORMAL HIGH (ref 4.0–10.5)

## 2013-01-06 LAB — BASIC METABOLIC PANEL
BUN: 9 mg/dL (ref 6–23)
Chloride: 104 mEq/L (ref 96–112)
GFR calc Af Amer: >90 mL/min (ref >90)
GFR calc non Af Amer: >90 mL/min (ref >90)
Potassium: 4.2 mEq/L (ref 3.5–5.1)
Sodium: 138 mEq/L (ref 135–145)

## 2013-01-06 MED ORDER — OXYCODONE HCL 15 MG PO TABS
ORAL_TABLET | ORAL | Status: DC
Start: 2013-01-06 — End: 2013-01-18

## 2013-01-06 MED ORDER — HYDROMORPHONE HCL 2 MG PO TABS
3.0000 mg | ORAL_TABLET | Freq: Once | ORAL | Status: AC
  Administered 2013-01-06: 3 mg via ORAL
  Filled 2013-01-06: qty 2

## 2013-01-06 MED ORDER — AMOXICILLIN 500 MG PO CAPS: 500.0000 mg | ORAL_CAPSULE | Freq: Three times a day (TID) | ORAL | Status: DC

## 2013-01-06 NOTE — Discharge Summary (Signed)
Physician Discharge Summary  [name] HQI:[id number] DOB: Oct 01, 1990 DOA: 12/31/2012  PCP: Altha Harm., MD  Admit date: 12/31/2012 Discharge date: 01/06/2013  Discharge Diagnoses:  Active Problems:   Streptococcal sore throat   Discharge Condition: stable  Disposition:  home   Diet: Regular  Wt Readings from Last 3 Encounters:  01/06/13 88.4 kg (194 lb 14.2 oz)  12/04/12 87.091 kg (192 lb)  11/20/12 83.915 kg (185 lb)    Hospital Course:  This a 22 year old female was admitted with sickle cell crisis also found to have strep pharyngitis. She has been treated with by mouth amoxicillin and has done well. Her throat pain has resolved patient say she is eating well. For her sickle cell crisis she was placed the PCA pump, this was discontinued home meds resumed and when necessary Dilaudid continued, currently patient states that her pain is better controlled and she is ready for discharge. Patient will complete a 14 day course of amoxicillin, she is advised to followup with her PCP in one week or Dr. Ashley Royalty in 4 days and she's recommended to get a test of cure for her strep pharyngitis given the fact that she is asplenic.  Patient expresses understanding.   Discharge Exam: Filed Vitals:   01/06/13 0503  BP: 99/47  Pulse: 100  Temp: 98.3 F (36.8 C)  Resp: 18   Filed Vitals:   01/05/13 1613 01/05/13 2115 01/06/13 0237 01/06/13 0503  BP:  121/57 108/55 99/47  Pulse:  101 97 100  Temp:  98.4 F (36.9 C) 98.6 F (37 C) 98.3 F (36.8 C)  TempSrc:  Oral Oral Oral  Resp: 16 18 18 18   Height:      Weight:    88.4 kg (194 lb 14.2 oz)  SpO2: 94% 100% 96% 100%   General:  alert and oriented, no acute distress  Cardiovascular: regular rate rhythm without murmurs regurg or gallops  Respiratory: no wheeze clear to auscultation good air exchange  Abdomen: Soft possible sounds nontender nondistended no organomegaly  Extremities: No clubbing cyanosis or  edema  Discharge Instructions   Future Appointments Provider Department Dept Phone   02/04/2013 2:00 PM Altha Harm, MD [facility] [phone]       Medication List         amoxicillin 500 MG capsule  Commonly known as:  AMOXIL  Take 1 capsule (500 mg total) by mouth 3 (three) times daily.     folic acid 800 MCG tablet  Commonly known as:  FOLVITE  Take 800 mcg by mouth every morning.     hydroxyurea 500 MG capsule  Commonly known as:  HYDREA  Take 2 capsules (1,000 mg total) by mouth at bedtime. May take with food to minimize GI side effects.     ibuprofen 600 MG tablet  Commonly known as:  ADVIL,MOTRIN  Take 1 tablet (600 mg total) by mouth every 6 (six) hours as needed for pain.     oxyCODONE 5 MG immediate release tablet  Commonly known as:  ROXICODONE  Take 1 tablet (5 mg total) by mouth 4 (four) times daily as needed for pain. May take 1 tablet by mouth every 6 hours as needed for PAIN     oxyCODONE 15 MG immediate release tablet  Commonly known as:  ROXICODONE  15 mg every  May take 1 tablet by mouth every 6 hours as needed for PAIN     polyethylene glycol packet  Commonly known as:  MIRALAX / GLYCOLAX  Take 17 g by mouth daily.     sertraline 50 MG tablet  Commonly known as:  ZOLOFT  Take 1 tablet (50 mg total) by mouth daily.          The results of significant diagnostics from this hospitalization (including imaging, microbiology, ancillary and laboratory) are listed below for reference.    Significant Diagnostic Studies: Dg Chest 2 View  12/31/2012   CLINICAL DATA:  Sickle cell pain crisis. Sickle cell disease. Sore throat.  EXAM: CHEST  2 VIEW  COMPARISON:  10/05/2012  FINDINGS: The heart size and mediastinal contours are within normal limits. Both lungs are clear. Chronic skeletal changes of sickle cell disease are again noted.  IMPRESSION: No active cardiopulmonary disease.   Electronically Signed   By: Myles Rosenthal   On:  12/31/2012 17:04   Dg Chest Port 1 View  01/01/2013   *RADIOLOGY REPORT*  Clinical Data: Sickle cell, chest pain.  PORTABLE CHEST - 1 VIEW  Comparison: 12/31/2012.  Findings: Trachea is midline.  Heart size stable.  Mild accentuation of the pulmonary markings may be technical in etiology.  No focal airspace consolidation or pleural fluid. Sickle cell changes are seen in the humeral heads bilaterally.  IMPRESSION: Mild accentuation of pulmonary markings may be technical in etiology.  Difficult to exclude trace edema.   Original Report Authenticated By: Leanna Battles, M.D.    Microbiology: Recent Results (from the past 240 hour(s))  RAPID STREP SCREEN     Status: Abnormal   Collection Time    12/31/12  5:34 PM      Result Value Range Status   Streptococcus, Group A Screen (Direct) POSITIVE (*) NEGATIVE Final     Labs: Basic Metabolic Panel:  Recent Labs Lab 01/02/13 0400 01/03/13 0935 01/04/13 0400 01/05/13 0445 01/06/13 0410  NA 137 138 135 135 138  K 5.5* 4.1 3.8 4.7 4.2  CL 103 102 101 101 104  CO2 26 28 28 27 26   GLUCOSE 103* 98 104* 113* 111*  BUN 9 9 10 8 9   CREATININE 0.61 0.62 0.65 0.68 0.69  CALCIUM 9.0 9.0 8.9 9.0 9.1   Liver Function Tests:  Recent Labs Lab 12/31/12 1606 01/01/13 0521 01/02/13 0400 01/03/13 0935 01/04/13 0400  AST 15 14 61* 39* 34  ALT 10 9 21  40* 41*  ALKPHOS 64 70 113 83 73  BILITOT 1.8* 2.1* 2.6* 2.4* 2.0*  PROT 7.0 7.1 7.3 7.2 6.8  ALBUMIN 3.7 3.7 3.7 3.6 3.6   No results found for this basename: LIPASE, AMYLASE,  in the last 168 hours No results found for this basename: AMMONIA,  in the last 168 hours CBC:  Recent Labs Lab 12/31/12 1606 01/01/13 0521 01/02/13 0400 01/03/13 0349 01/04/13 0400 01/05/13 0445 01/06/13 0410  WBC 26.2* 26.5* 22.0* 16.5* 15.4* 14.2* 14.5*  NEUTROABS 22.8* 21.4* 17.7* 11.0* 9.2*  --   --   HGB 9.4* 9.5* 9.0* 7.9* 7.4* 7.3* 7.2*  HCT 26.8* 27.0* 26.4* 22.6* 20.8* 20.9* 21.0*  MCV 92.7 93.8 94.0  92.6 92.0 92.9 94.6  PLT 505* 487* 392 427* 417* 436* 430*   Cardiac Enzymes: No results found for this basename: CKTOTAL, CKMB, CKMBINDEX, TROPONINI,  in the last 168 hours BNP: No components found with this basename: POCBNP,  CBG: No results found for this basename: GLUCAP,  in the last 168 hours Ferritin: No results found for this basename: FERRITIN,  in the last 168 hours  Time coordinating discharge:  35 minutes  Signed:  Cattie Tineo  01/06/2013, 8:44 AM

## 2013-01-18 ENCOUNTER — Telehealth: Payer: Self-pay | Admitting: Internal Medicine

## 2013-01-18 DIAGNOSIS — G8929 Other chronic pain: Secondary | ICD-10-CM

## 2013-01-18 DIAGNOSIS — D571 Sickle-cell disease without crisis: Secondary | ICD-10-CM

## 2013-01-18 MED ORDER — HYDROXYUREA 500 MG PO CAPS: 1000.0000 mg | ORAL_CAPSULE | Freq: Every day | ORAL | Status: DC

## 2013-01-18 MED ORDER — OXYCODONE HCL 5 MG PO TABS: 5.0000 mg | ORAL_TABLET | Freq: Four times a day (QID) | ORAL | Status: DC | PRN

## 2013-01-18 MED ORDER — OXYCODONE HCL 15 MG PO TABS: ORAL_TABLET | ORAL | Status: DC

## 2013-01-18 NOTE — Telephone Encounter (Signed)
Spoke with pt medication given after hosp d/c in which she was given 2 days until able to pick up her prescriptions. Oxycodone 5mg  and 10mg  # 60 will be ready next 01/21/13

## 2013-01-21 IMAGING — CR DG CHEST 1V PORT
1 series · 1 of 1 positions shown · non-contrast
Comparison: 01/08/2012

CLINICAL DATA: Confirm PICC line placement

PORTABLE CHEST - 1 VIEW

[AP]
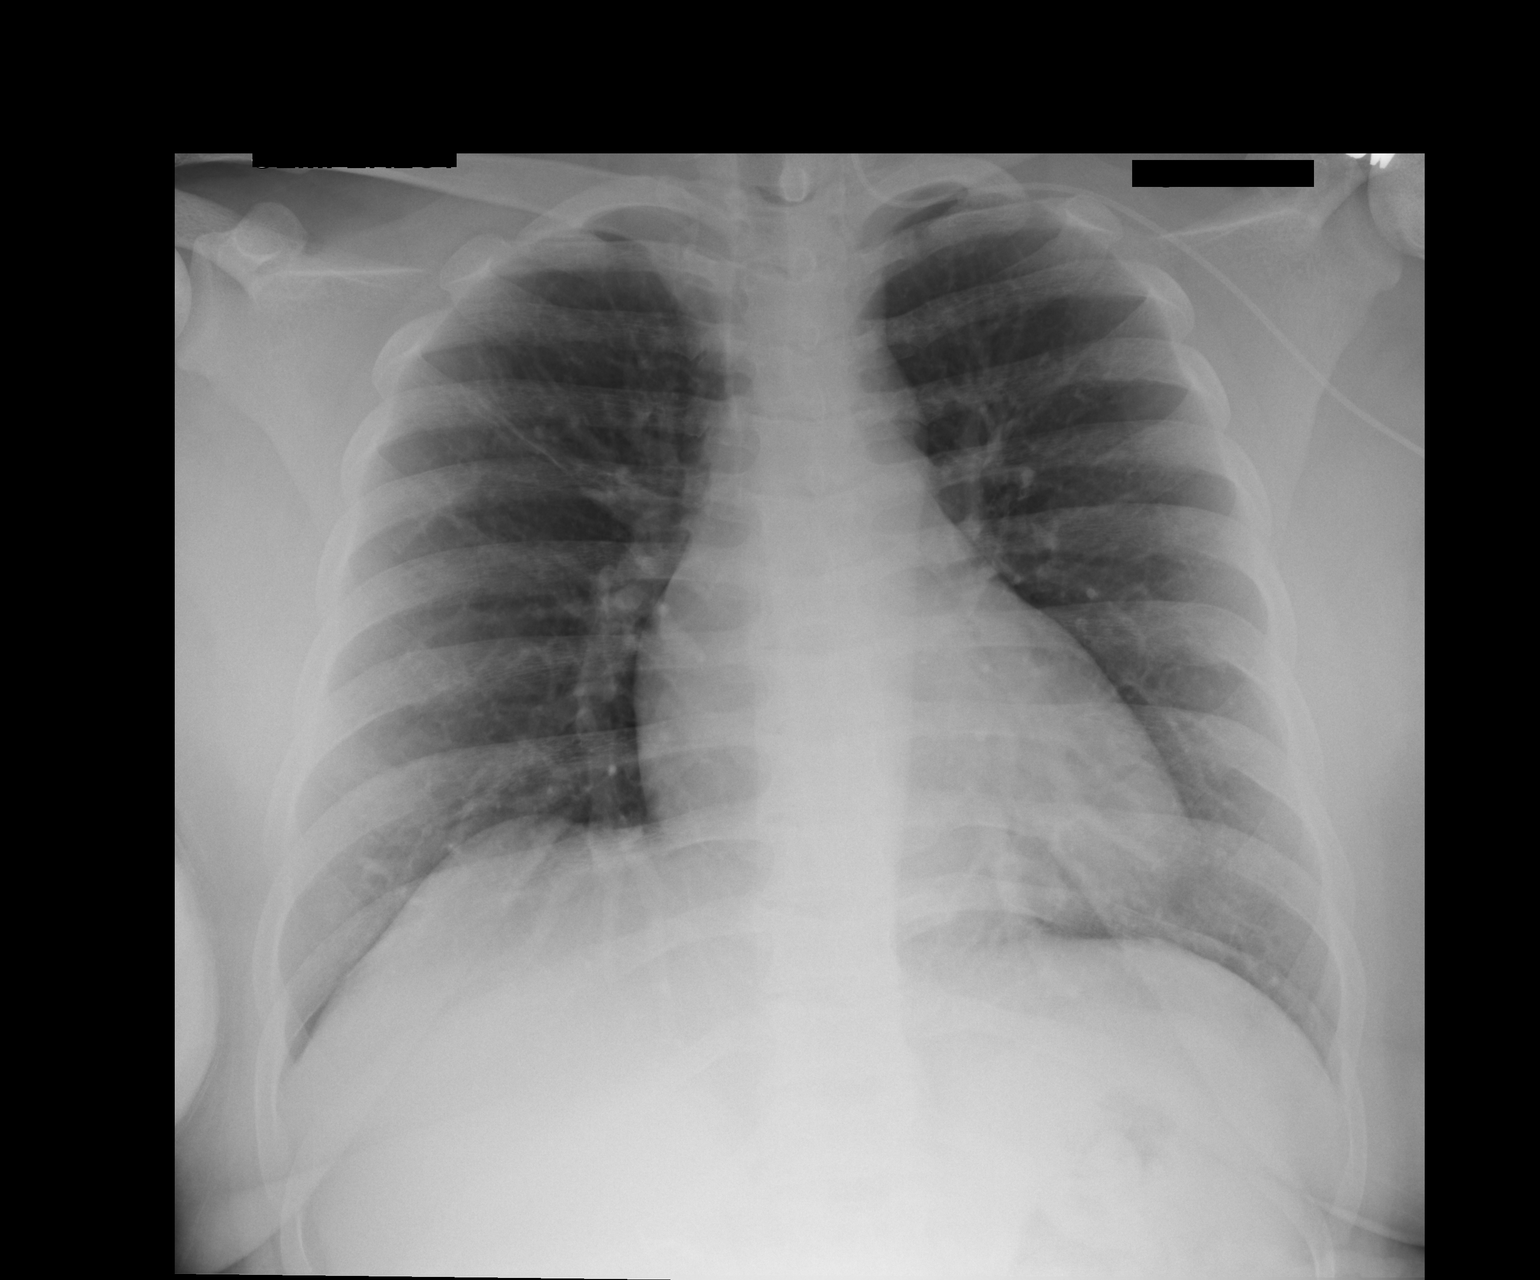

[1 of 1 positions shown; findings below may reference images not displayed]

FINDINGS: Grossly unchanged cardiac silhouette and mediastinal contours.  No
focal parenchymal opacities.  There is persistent mild elevation of
the right hemidiaphragm.  No definite pleural effusion or
pneumothorax.

Interval placement of left upper extremity approach PICC line with
tip coursing up the expected location of the left internal jugular
vein, tip excluded from view.
IMPRESSION: Malpositioned left upper extremity approach PICC line with tip
coursing up the expected location of the left internal jugular
vein, tip excluded from view.  Repositioning is advised.

This was made a call report.

## 2013-01-27 ENCOUNTER — Inpatient Hospital Stay (HOSPITAL_COMMUNITY)
Admission: EM | Admit: 2013-01-27 | Discharge: 2013-01-30 | DRG: 812 | Disposition: A | Discharge status: Home / Self Care | Payer: Medicare Other | Attending: Internal Medicine | Admitting: Internal Medicine

## 2013-01-27 ENCOUNTER — Emergency Department (HOSPITAL_COMMUNITY): Payer: Medicare Other

## 2013-01-27 ENCOUNTER — Encounter (HOSPITAL_COMMUNITY): Payer: Self-pay

## 2013-01-27 DIAGNOSIS — M545 Low back pain, unspecified: Secondary | ICD-10-CM | POA: Diagnosis present

## 2013-01-27 DIAGNOSIS — M79651 Pain in right thigh: Secondary | ICD-10-CM

## 2013-01-27 DIAGNOSIS — D72829 Elevated white blood cell count, unspecified: Secondary | ICD-10-CM | POA: Diagnosis present

## 2013-01-27 DIAGNOSIS — G8929 Other chronic pain: Secondary | ICD-10-CM | POA: Diagnosis not present

## 2013-01-27 DIAGNOSIS — F39 Unspecified mood [affective] disorder: Secondary | ICD-10-CM | POA: Diagnosis present

## 2013-01-27 DIAGNOSIS — R51 Headache: Secondary | ICD-10-CM

## 2013-01-27 DIAGNOSIS — D649 Anemia, unspecified: Secondary | ICD-10-CM

## 2013-01-27 DIAGNOSIS — R0789 Other chest pain: Secondary | ICD-10-CM | POA: Diagnosis not present

## 2013-01-27 DIAGNOSIS — G43909 Migraine, unspecified, not intractable, without status migrainosus: Secondary | ICD-10-CM

## 2013-01-27 DIAGNOSIS — F6389 Other impulse disorders: Secondary | ICD-10-CM

## 2013-01-27 DIAGNOSIS — D5700 Hb-SS disease with crisis, unspecified: Secondary | ICD-10-CM | POA: Diagnosis not present

## 2013-01-27 DIAGNOSIS — F329 Major depressive disorder, single episode, unspecified: Secondary | ICD-10-CM | POA: Diagnosis not present

## 2013-01-27 DIAGNOSIS — F17200 Nicotine dependence, unspecified, uncomplicated: Secondary | ICD-10-CM | POA: Diagnosis present

## 2013-01-27 DIAGNOSIS — D571 Sickle-cell disease without crisis: Secondary | ICD-10-CM | POA: Diagnosis present

## 2013-01-27 DIAGNOSIS — R4586 Emotional lability: Secondary | ICD-10-CM

## 2013-01-27 DIAGNOSIS — J984 Other disorders of lung: Secondary | ICD-10-CM | POA: Diagnosis not present

## 2013-01-27 DIAGNOSIS — R509 Fever, unspecified: Secondary | ICD-10-CM

## 2013-01-27 DIAGNOSIS — F32A Depression, unspecified: Secondary | ICD-10-CM

## 2013-01-27 DIAGNOSIS — R079 Chest pain, unspecified: Secondary | ICD-10-CM | POA: Diagnosis not present

## 2013-01-27 DIAGNOSIS — R000 Tachycardia, unspecified: Secondary | ICD-10-CM

## 2013-01-27 DIAGNOSIS — K219 Gastro-esophageal reflux disease without esophagitis: Secondary | ICD-10-CM | POA: Diagnosis present

## 2013-01-27 DIAGNOSIS — M549 Dorsalgia, unspecified: Secondary | ICD-10-CM

## 2013-01-27 DIAGNOSIS — J020 Streptococcal pharyngitis: Secondary | ICD-10-CM

## 2013-01-27 DIAGNOSIS — R112 Nausea with vomiting, unspecified: Secondary | ICD-10-CM

## 2013-01-27 DIAGNOSIS — F439 Reaction to severe stress, unspecified: Secondary | ICD-10-CM

## 2013-01-27 DIAGNOSIS — E663 Overweight: Secondary | ICD-10-CM

## 2013-01-27 LAB — CBC WITH DIFFERENTIAL/PLATELET
Eosinophils Relative: 3 % (ref 0–5)
Hemoglobin: 9.6 g/dL — ABNORMAL LOW (ref 12.0–15.0)
Monocytes Relative: 11 % (ref 3–12)
Neutrophils Relative %: 54 % (ref 43–77)
Platelets: 540 10*3/uL — ABNORMAL HIGH (ref 150–400)
RBC: 2.94 MIL/uL — ABNORMAL LOW (ref 3.87–5.11)
WBC: 13.2 10*3/uL — ABNORMAL HIGH (ref 4.0–10.5)

## 2013-01-27 LAB — COMPREHENSIVE METABOLIC PANEL
ALT: 11 U/L (ref 0–35)
AST: 20 U/L (ref 0–37)
Alkaline Phosphatase: 70 U/L (ref 39–117)
CO2: 25 mEq/L (ref 19–32)
Chloride: 104 mEq/L (ref 96–112)
GFR calc non Af Amer: >90 mL/min (ref >90)
Potassium: 3.7 mEq/L (ref 3.5–5.1)
Sodium: 139 mEq/L (ref 135–145)
Total Bilirubin: 1.2 mg/dL (ref 0.3–1.2)

## 2013-01-27 LAB — URINALYSIS, ROUTINE W REFLEX MICROSCOPIC
Bilirubin Urine: NEGATIVE
Hgb urine dipstick: NEGATIVE
Ketones, ur: NEGATIVE mg/dL
Specific Gravity, Urine: 1.014 (ref 1.005–1.030)
Urobilinogen, UA: 1 mg/dL (ref 0.0–1.0)

## 2013-01-27 LAB — RETICULOCYTES: RBC.: 2.94 MIL/uL — ABNORMAL LOW (ref 3.87–5.11)

## 2013-01-27 LAB — LACTATE DEHYDROGENASE: LDH: 324 U/L — ABNORMAL HIGH (ref 94–250)

## 2013-01-27 LAB — TROPONIN I: Troponin I: <0.3 ng/mL (ref <0.30)

## 2013-01-27 IMAGING — US US ABDOMEN COMPLETE
1 series · 14 of 25 positions shown · non-contrast
Comparison: CT abdomen 06/21/2011

CLINICAL DATA: Sickle cell crisis.  Bilateral flank pain.

COMPLETE ABDOMINAL ULTRASOUND

[Series 1: us abdomen complete · 0.32mm/px · 14 of 65 slices shown]
[im 1/65]
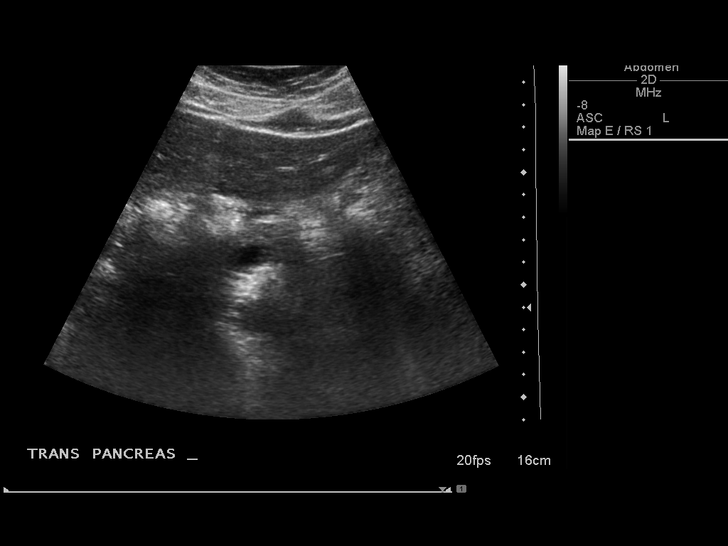
[im 6/65]
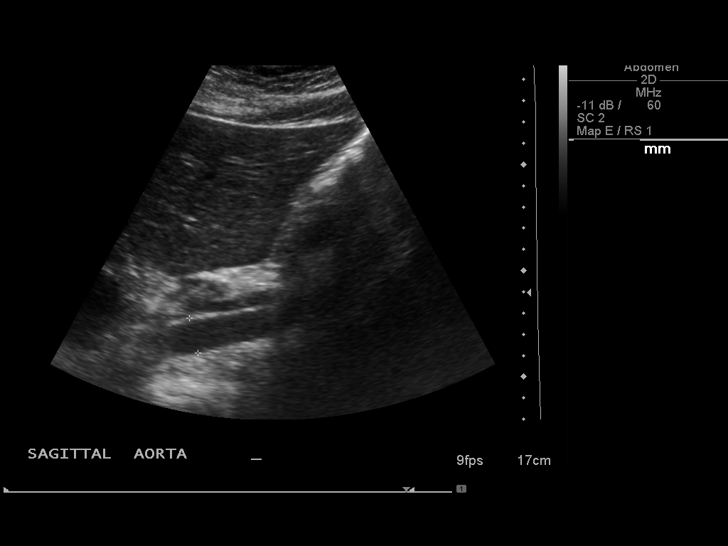
[im 11/65]
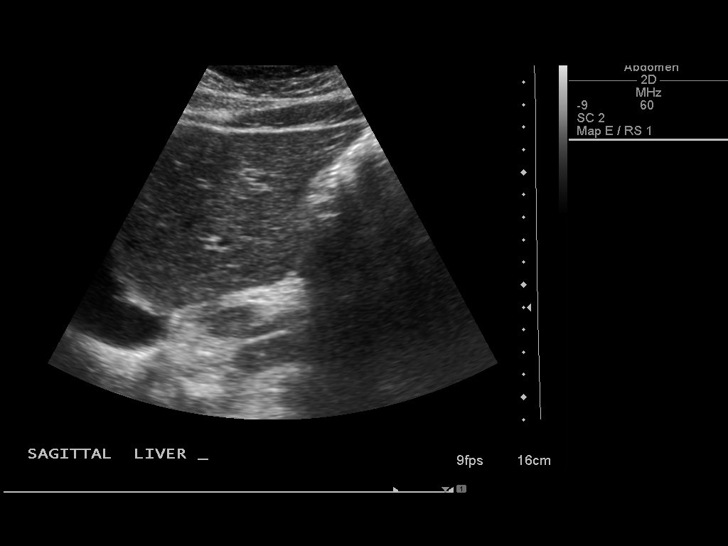
[im 17/65]
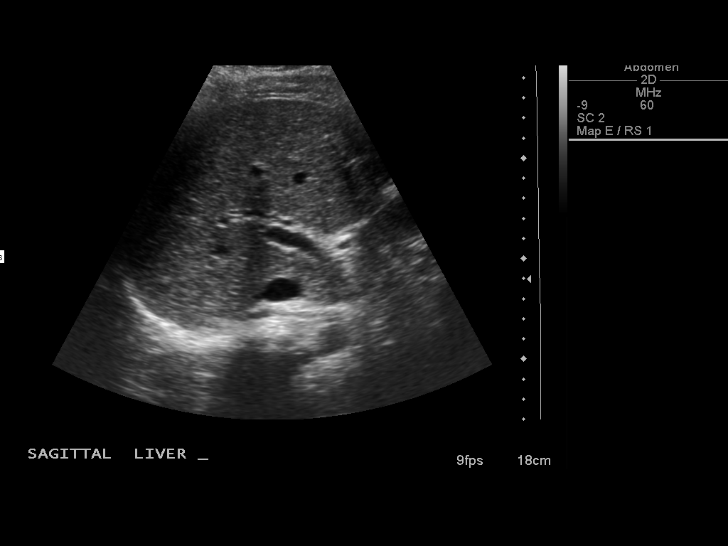
[im 22/65]
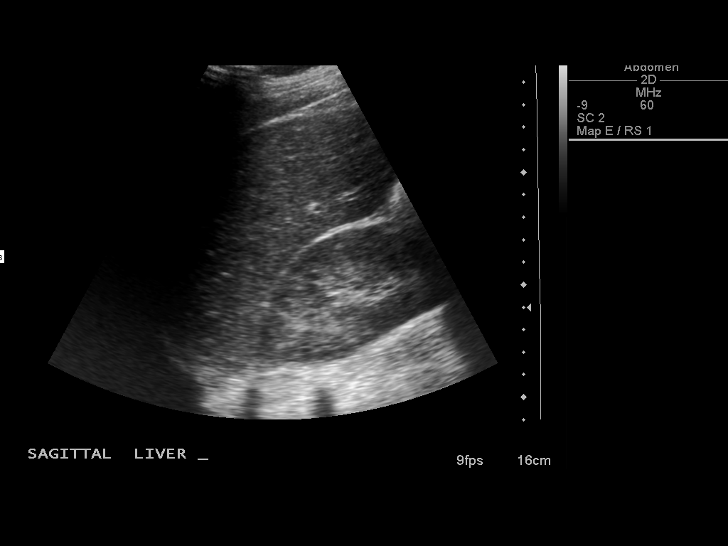
[im 25/65]
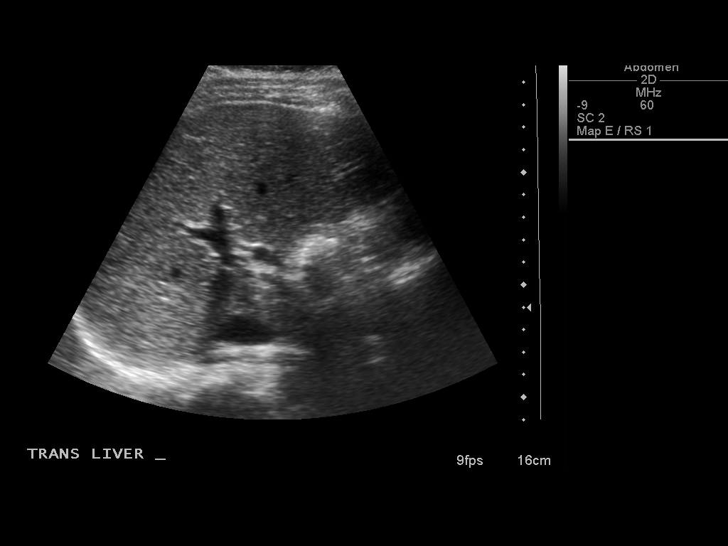
[im 30/65]
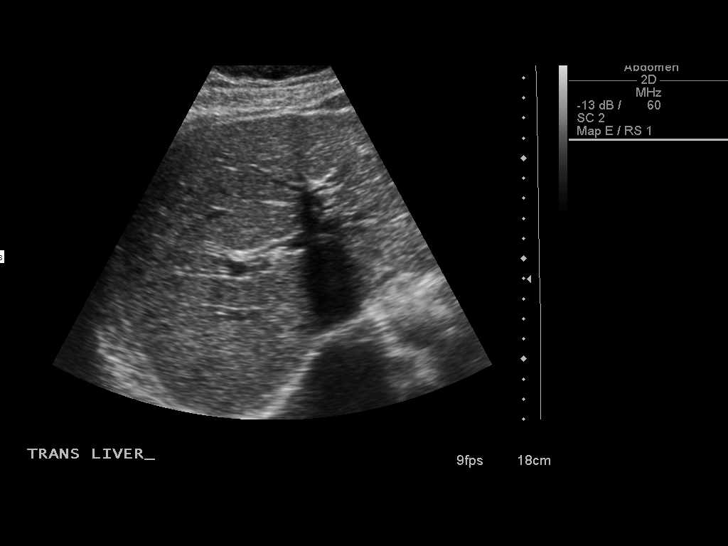
[im 35/65]
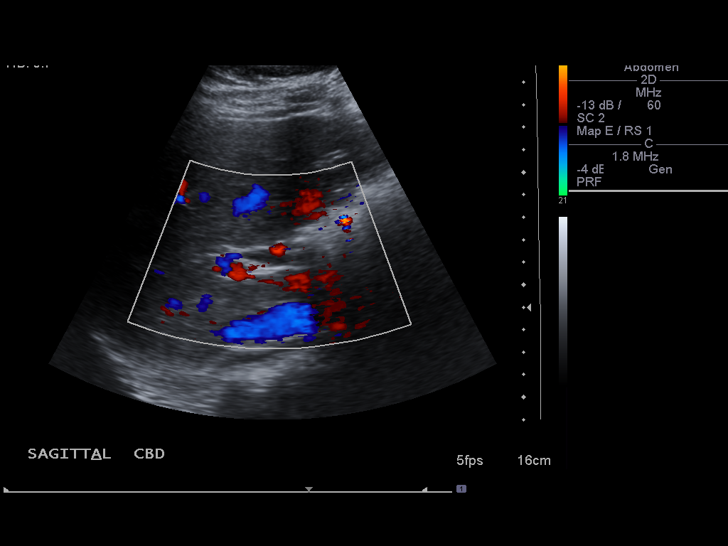
[im 41/65]
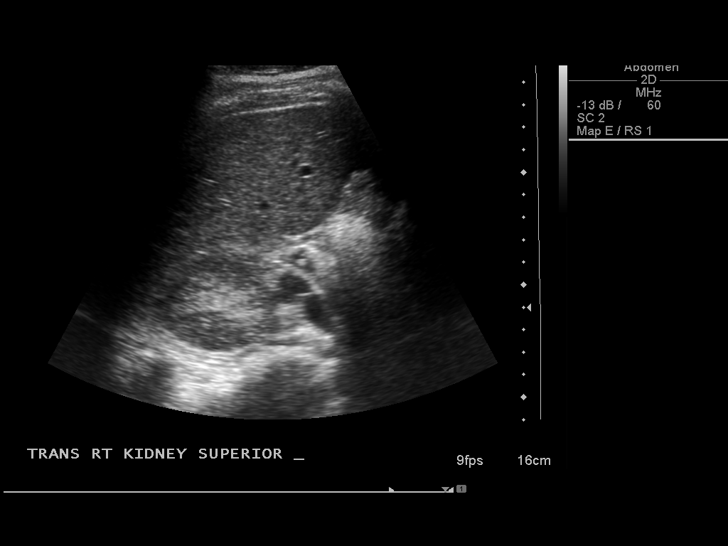
[im 43/65]
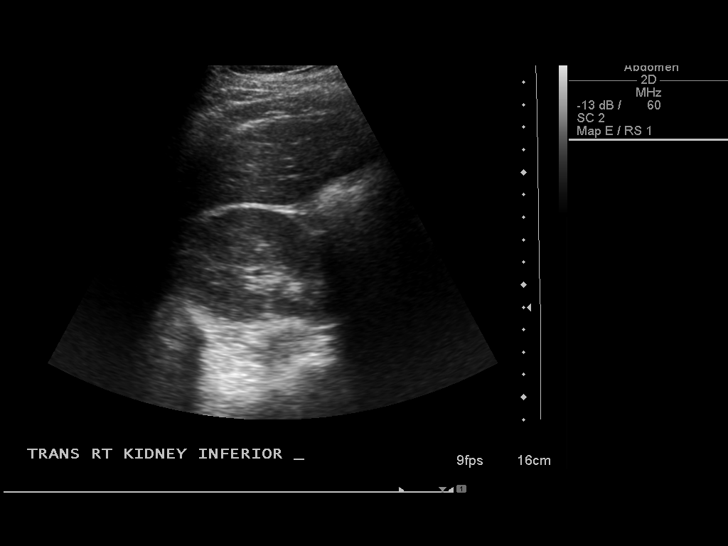
[im 49/65]
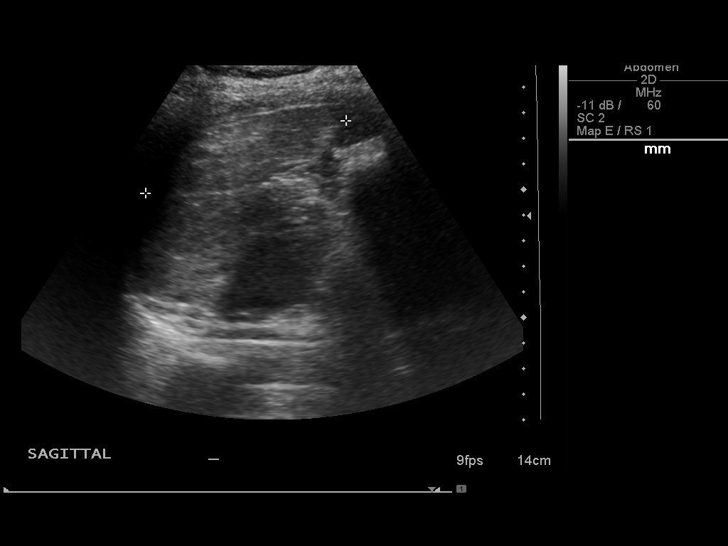
[im 54/65]
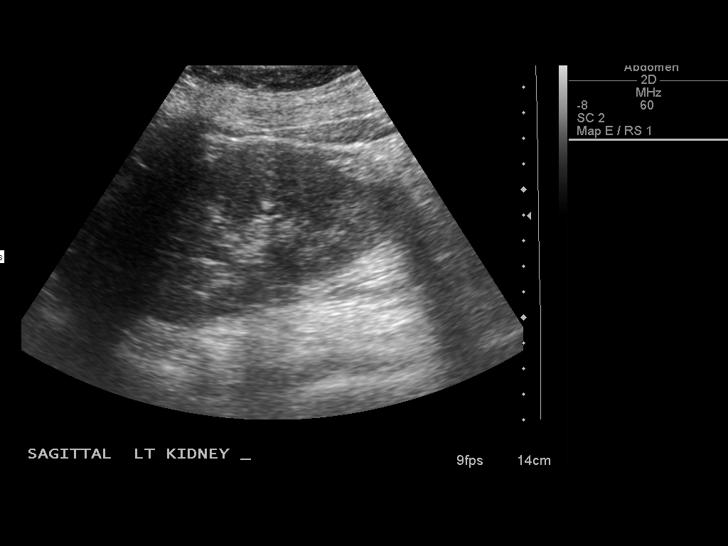
[im 59/65]
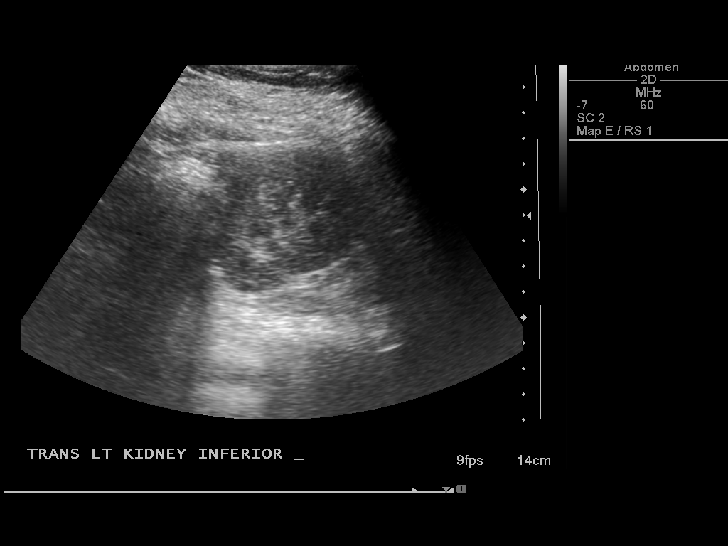
[im 65/65]
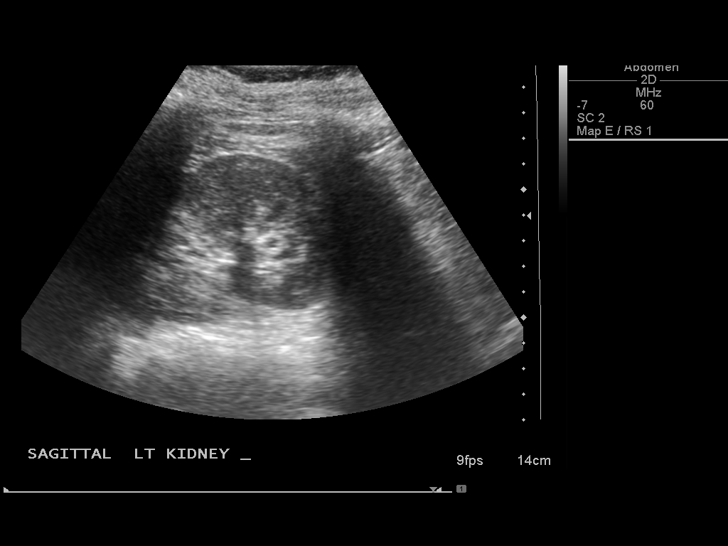

[14 of 25 positions shown; findings below may reference images not displayed]

FINDINGS: Gallbladder:  Surgically absent

Common bile duct:  Normal at 5 mm.

Liver:  No focal lesion identified.  Within normal limits in
parenchymal echogenicity.

IVC:  Appears normal.

Pancreas:  No focal abnormality seen.

Spleen:  Small and  lobular

Right Kidney:  11.1cm in length.  No evidence of hydronephrosis or
stones.

Left Kidney:  11.5cm in length.  No evidence of hydronephrosis or
stones.

Abdominal aorta:  No aneurysm identified.
IMPRESSION: 1..  No acute abdominal findings.

2.  Small lobular spleen suggests early auto infarction.
3..  No hydronephrosis.

## 2013-01-27 MED ORDER — HYDROMORPHONE HCL PF 1 MG/ML IJ SOLN
1.0000 mg | INTRAMUSCULAR | Status: DC | PRN
  Administered 2013-01-27 (×3): 1 mg via INTRAVENOUS
  Filled 2013-01-27 (×3): qty 1

## 2013-01-27 MED ORDER — ONDANSETRON HCL 4 MG/2ML IJ SOLN: 4.0000 mg | Freq: Four times a day (QID) | INTRAMUSCULAR | Status: DC | PRN

## 2013-01-27 MED ORDER — HYDROMORPHONE HCL PF 1 MG/ML IJ SOLN: 1.0000 mg | INTRAMUSCULAR | Status: AC | PRN

## 2013-01-27 MED ORDER — SODIUM CHLORIDE 0.9 % IV BOLUS (SEPSIS)
1000.0000 mL | Freq: Once | INTRAVENOUS | Status: AC
  Administered 2013-01-27: 1000 mL via INTRAVENOUS

## 2013-01-27 MED ORDER — ACETAMINOPHEN 325 MG PO TABS: 650.0000 mg | ORAL_TABLET | Freq: Four times a day (QID) | ORAL | Status: DC | PRN

## 2013-01-27 MED ORDER — FOLIC ACID 1 MG PO TABS
1.0000 mg | ORAL_TABLET | Freq: Every day | ORAL | Status: DC
  Administered 2013-01-27 – 2013-01-30 (×4): 1 mg via ORAL
  Filled 2013-01-27 (×4): qty 1

## 2013-01-27 MED ORDER — HYDROXYUREA 500 MG PO CAPS
1000.0000 mg | ORAL_CAPSULE | Freq: Every day | ORAL | Status: DC
  Administered 2013-01-27 – 2013-01-29 (×3): 1000 mg via ORAL
  Filled 2013-01-27 (×5): qty 2

## 2013-01-27 MED ORDER — SODIUM CHLORIDE 0.9 % IV SOLN
INTRAVENOUS | Status: DC
  Administered 2013-01-27 – 2013-01-28 (×3): via INTRAVENOUS

## 2013-01-27 MED ORDER — PRENATAL MULTIVITAMIN CH
1.0000 | ORAL_TABLET | Freq: Every day | ORAL | Status: DC
  Administered 2013-01-27 – 2013-01-30 (×4): 1 via ORAL
  Filled 2013-01-27 (×4): qty 1

## 2013-01-27 MED ORDER — FOLIC ACID 800 MCG PO TABS: 800.0000 ug | ORAL_TABLET | Freq: Every morning | ORAL | Status: DC

## 2013-01-27 MED ORDER — POLYETHYLENE GLYCOL 3350 17 G PO PACK
17.0000 g | PACK | Freq: Every day | ORAL | Status: DC | PRN
  Administered 2013-01-29: 17 g via ORAL
  Filled 2013-01-27: qty 1

## 2013-01-27 MED ORDER — SODIUM CHLORIDE 0.9 % IV SOLN: INTRAVENOUS | Status: DC

## 2013-01-27 MED ORDER — OXYCODONE HCL 5 MG PO TABS: 15.0000 mg | ORAL_TABLET | Freq: Four times a day (QID) | ORAL | Status: DC | PRN

## 2013-01-27 MED ORDER — SERTRALINE HCL 50 MG PO TABS
50.0000 mg | ORAL_TABLET | Freq: Every day | ORAL | Status: DC
  Administered 2013-01-27 – 2013-01-30 (×4): 50 mg via ORAL
  Filled 2013-01-27 (×4): qty 1

## 2013-01-27 MED ORDER — HYDROMORPHONE HCL PF 2 MG/ML IJ SOLN
3.0000 mg | INTRAMUSCULAR | Status: DC | PRN
  Administered 2013-01-27 – 2013-01-28 (×9): 3 mg via INTRAVENOUS
  Filled 2013-01-27 (×9): qty 2

## 2013-01-27 MED ORDER — ENOXAPARIN SODIUM 40 MG/0.4ML ~~LOC~~ SOLN
40.0000 mg | SUBCUTANEOUS | Status: DC
  Administered 2013-01-27: 40 mg via SUBCUTANEOUS
  Filled 2013-01-27 (×4): qty 0.4

## 2013-01-27 MED ORDER — ONDANSETRON HCL 4 MG/2ML IJ SOLN: 4.0000 mg | Freq: Three times a day (TID) | INTRAMUSCULAR | Status: AC | PRN

## 2013-01-27 MED ORDER — ONDANSETRON HCL 4 MG/2ML IJ SOLN
4.0000 mg | Freq: Once | INTRAMUSCULAR | Status: AC
  Administered 2013-01-27: 4 mg via INTRAVENOUS
  Filled 2013-01-27: qty 2

## 2013-01-27 NOTE — ED Provider Notes (Signed)
TIME SEEN: 7:44 AM  CHIEF COMPLAINT: Sickle cell pain crisis, chest pain  HPI: Patient is a 22 y.o. female with a history of sickle cell, migraines who presents the emergency department with complaints of her typical sickle cell crisis that started at midnight last night. She is complaining of back pain and right elbow pain. Patient is also complaining of moderate, sharp chest pain which is atypical for her crises. She denies any shortness of breath. She states she did have a productive cough last week that has resolved over the past 2 days. No fever or chills. No vomiting or diarrhea. She denies any prior history of pulmonary embolus or MI. States her chest pain is worse with coughing. No alleviating factors. No radiation.  ROS: See HPI Constitutional: no fever  Eyes: no drainage  ENT: no runny nose   Cardiovascular:   chest pain  Resp: no SOB  GI: no vomiting GU: no dysuria Integumentary: no rash  Allergy: no hives  Musculoskeletal: no leg swelling or pain Neurological: no slurred speech ROS otherwise negative  PAST MEDICAL HISTORY/PAST SURGICAL HISTORY:  Past Medical History  Diagnosis Date  . H/O: 1 miscarriage 03/22/2011  . 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 Depression 01/06/2011  . GERD (gastroesophageal reflux disease) 02/17/2011  . Trichotillomania     h/o  . Blood transfusion     "lots"  . Sickle cell anemia with crisis   . Exertional dyspnea     "sometimes"  . Sickle cell anemia   . Headache(784.0)   . Migraines 11/08/11    "@ least twice/month"  . Chronic back pain     "very severe; have knot in my back; from tight muscle; take RX and exercise for it"  . Mood swings 11/08/11    "I go back and forth; real bad"  . Pregnancy   . Blood dyscrasia     SICKLE CELL    MEDICATIONS:  Prior to Admission medications   Medication Sig Start Date End Date Taking? Authorizing Provider  amoxicillin (AMOXIL) 500 MG capsule Take 1 capsule (500 mg total) by mouth 3 (three) times daily.  01/06/13   Gery Pray, MD  folic acid (FOLVITE) 800 MCG tablet Take 800 mcg by mouth every morning.    Historical Provider, MD  hydroxyurea (HYDREA) 500 MG capsule Take 2 capsules (1,000 mg total) by mouth at bedtime. May take with food to minimize GI side effects. 01/18/13   Grayce Sessions, NP  ibuprofen (ADVIL,MOTRIN) 600 MG tablet Take 1 tablet (600 mg total) by mouth every 6 (six) hours as needed for pain. 11/19/12   Grayce Sessions, NP  oxyCODONE (ROXICODONE) 15 MG immediate release tablet 15 mg every  May take 1 tablet by mouth every 6 hours as needed for PAIN 01/18/13   Grayce Sessions, NP  oxyCODONE (ROXICODONE) 5 MG immediate release tablet Take 1 tablet (5 mg total) by mouth 4 (four) times daily as needed for pain. May take 1 tablet by mouth every 6 hours as needed for PAIN 01/18/13 01/18/14  Grayce Sessions, NP  polyethylene glycol (MIRALAX / GLYCOLAX) packet Take 17 g by mouth daily. 10/12/12   Gery Pray, MD  sertraline (ZOLOFT) 50 MG tablet Take 1 tablet (50 mg total) by mouth daily. 10/15/12   Altha Harm, MD    ALLERGIES:  Allergies  Allergen Reactions  . Carrot Oil Hives and Swelling  . Latex Rash    SOCIAL HISTORY:  History  Substance Use Topics  .  Smoking status: Current Every Day Smoker -- 0.25 packs/day for 1 years    Types: Cigarettes  . Smokeless tobacco: Never Used  . Alcohol Use: Yes     Comment: pt states she quit marijuan in May 2013. Rare ETOH, + cigarettes.  She is enrolled in school    FAMILY HISTORY: Family History  Problem Relation Age of Onset  . Diabetes Mother   . Alcoholism    . Depression    . Hypercholesterolemia    . Hypertension    . Migraines    . Diabetes Maternal Grandmother   . Diabetes Paternal Grandmother     EXAM: BP 129/78  Pulse 97  Temp(Src) 99.7 F (37.6 C) (Oral)  Resp 20  Ht 5\' 2"  (1.575 m)  Wt 175 lb (79.379 kg)  BMI 32 kg/m2  SpO2 98% CONSTITUTIONAL: Alert and oriented and responds appropriately to  questions. Appears uncomfortable but nontoxic, well-hydrated HEAD: Normocephalic EYES: Conjunctivae clear, PERRL, no scleral icterus ENT: normal nose; no rhinorrhea; moist mucous membranes; pharynx without lesions noted NECK: Supple, no meningismus, no LAD  CARD: RRR; S1 and S2 appreciated; no murmurs, no clicks, no rubs, no gallops RESP: Normal chest excursion without splinting or tachypnea; breath sounds clear and equal bilaterally; no wheezes, no rhonchi, no rales,  ABD/GI: Normal bowel sounds; non-distended; soft, non-tender, no rebound, no guarding BACK:  The back appears normal and is non-tender to palpation, there is no CVA tenderness EXT: Tender to palpation over her right elbow with no joint effusion, Normal ROM in all joints;  no edema; normal capillary refill; no cyanosis    SKIN: Normal color for age and race; warm NEURO: Moves all extremities equally; no slurred speech, no facial droop PSYCH: The patient's mood and manner are appropriate. Grooming and personal hygiene are appropriate.  MEDICAL DECISION MAKING: Patient with typical sickle cell crisis as well as chest pain. She is hemodynamically stable. EKG shows no ischemic changes. Low concern for pulmonary embolus as patient is not tachycardic or hypoxic and she denies any shortness of breath.  Will obtain basic labs, chest x-ray. We'll give IV fluids, pain medication and anti-emetics. Patient reports that she would like to go home if pain is controlled.   Date: 01/27/2013 7:31 AM  Rate: 93  Rhythm: normal sinus rhythm  QRS Axis: normal  Intervals: normal  ST/T Wave abnormalities: Diffuse ST flattening  Conduction Disutrbances: none  Narrative Interpretation: unremarkable; diffuse ST flattening, no change compared to prior EKG     ED PROGRESS: Patient reports some improvement of pain after one run of Dilaudid. Her labs are unremarkable. Hemoglobin is at baseline. She has a leukocytosis but this is improving from her prior  visits. Chest x-ray clear. EKG unremarkable. Troponin negative. Given her patient started at midnight, do not feel she needs a second set of cardiac labs. Will continue with pain control and reassess for disposition.  Pt reports pain is not controlled after 3 rounds of pain medication. Will admit to the hospitalist for sickle cell pain crisis and pain control.    Layla Maw Ward, DO 01/27/13 1154

## 2013-01-27 NOTE — ED Notes (Addendum)
Patient reports that she began having sickle cell pain back, right elbow, and chest. Patient states she has pain when she breathes.

## 2013-01-27 NOTE — Progress Notes (Signed)
[facility] History and Physical  [name] ZOX:[id number] DOB: 1991-01-09 DOA: 01/27/2013  Referring physician: EDP PCP: MATTHEWS,MICHELLE A., MD   Chief Complaint: Pain all over  HPI: [name] is a 22 y.o. female with h/o sickle cell disease, Depression presented to the ED at [facility] today with uncontrolled R elbow pain, low back pain and pain across her chest since yesterday night. She denies any fevers or chills, reports compliance with medications. Upon evaluation in ER, noted to have stable labs, Bili pending. Still in moderately severe pain after 3 doses of IV dilaudid and hence TRH consulted for further evaluation and management.   Review of Systems: The patient denies anorexia, fever, weight loss,, vision loss, decreased hearing, hoarseness, chest pain, syncope, dyspnea on exertion, peripheral edema, balance deficits, hemoptysis, abdominal pain, melena, hematochezia, severe indigestion/heartburn, hematuria, incontinence, genital sores, muscle weakness, suspicious skin lesions, transient blindness, difficulty walking, depression, unusual weight change, abnormal bleeding, enlarged lymph nodes, angioedema, and breast masses.    Past Medical History  Diagnosis Date  . H/O: 1 miscarriage 03/22/2011  . 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 Depression 01/06/2011  . GERD (gastroesophageal reflux disease) 02/17/2011  . Trichotillomania     h/o  . Blood transfusion     "lots"  . Sickle cell anemia with crisis   . Exertional dyspnea     "sometimes"  . Sickle cell anemia   . Headache(784.0)   . Migraines 11/08/11    "@ least twice/month"  . Chronic back pain     "very severe; have knot in my back; from tight muscle; take RX and exercise for it"  . Mood swings 11/08/11    "I go back and forth; real bad"  . Pregnancy   . Blood dyscrasia     SICKLE CELL   Past Surgical History  Procedure Laterality Date  . Cholecystectomy  05/2010  . Dilation and curettage of  uterus  02/20/11    S/P miscarriage   Social History:  reports that she has been smoking Cigarettes.  She has a .25 pack-year smoking history. She has never used smokeless tobacco. She reports that  drinks alcohol. She reports that she does not use illicit drugs. Lives at home with family  Allergies  Allergen Reactions  . Carrot Oil Hives and Swelling  . Latex Rash    Family History  Problem Relation Age of Onset  . Diabetes Mother   . Alcoholism    . Depression    . Hypercholesterolemia    . Hypertension    . Migraines    . Diabetes Maternal Grandmother   . Diabetes Paternal Grandmother     Prior to Admission medications   Medication Sig Start Date End Date Taking? Authorizing Provider  folic acid (FOLVITE) 800 MCG tablet Take 800 mcg by mouth every morning.   Yes Historical Provider, MD  hydroxyurea (HYDREA) 500 MG capsule Take 2 capsules (1,000 mg total) by mouth at bedtime. May take with food to minimize GI side effects. 01/18/13  Yes Grayce Sessions, NP  oxyCODONE (ROXICODONE) 15 MG immediate release tablet 15 mg every  May take 1 tablet by mouth every 6 hours as needed for PAIN 01/18/13  Yes Grayce Sessions, NP  oxyCODONE (ROXICODONE) 5 MG immediate release tablet Take 1 tablet (5 mg total) by mouth 4 (four) times daily as needed for pain. May take 1 tablet by mouth every 6 hours as needed for PAIN 01/18/13 01/18/14 Yes Grayce Sessions, NP  polyethylene  glycol (MIRALAX / GLYCOLAX) packet Take 17 g by mouth daily as needed (for constipation).   Yes Historical Provider, MD  Prenatal Vit-Fe Fumarate-FA (PRENATAL MULTIVITAMIN) TABS tablet Take 1 tablet by mouth daily at 12 noon.   Yes Historical Provider, MD  sertraline (ZOLOFT) 50 MG tablet Take 1 tablet (50 mg total) by mouth daily. 10/15/12  Yes Altha Harm, MD   Physical Exam: Filed Vitals:   01/27/13 1259  BP: 124/94  Pulse:   Temp: 98.9 F (37.2 C)  Resp: 19     General:  AAOx3, crying in pain  HEENT:  PERRLA, EOMI  Cardiovascular: S1S2/RRR  Respiratory: diminished at bases  Abdomen: soft, NT, BS present  Skin: no edema c/c  Musculoskeletal: mild R elbow pain with RoM  Psychiatric: anxious, tearful  Neurologic: non focal  Labs on Admission:  Basic Metabolic Panel:  Recent Labs Lab 01/27/13 0809  NA 139  K 3.7  CL 104  CO2 25  GLUCOSE 92  BUN 5*  CREATININE 0.63  CALCIUM 8.9   Liver Function Tests:  Recent Labs Lab 01/27/13 0809  AST 20  ALT 11  ALKPHOS 70  BILITOT 1.2  PROT 7.4  ALBUMIN 3.8   No results found for this basename: LIPASE, AMYLASE,  in the last 168 hours No results found for this basename: AMMONIA,  in the last 168 hours CBC:  Recent Labs Lab 01/27/13 0809  WBC 13.2*  NEUTROABS 7.1  HGB 9.6*  HCT 26.8*  MCV 91.2  PLT 540*   Cardiac Enzymes:  Recent Labs Lab 01/27/13 0820  TROPONINI <0.30    BNP (last 3 results) No results found for this basename: PROBNP,  in the last 8760 hours CBG: No results found for this basename: GLUCAP,  in the last 168 hours  Radiological Exams on Admission: Dg Chest 2 View  01/27/2013   *RADIOLOGY REPORT*  Clinical Data: Sickle cell crisis, chest pain  CHEST - 2 VIEW  Comparison: 01/01/2013  Findings: Heart size and vascular pattern are normal.  Mild right upper lobe atelectasis.  Lungs otherwise clear.  No pleural effusions.  Scoliosis stable.  IMPRESSION: Mild hypoventilatory right upper lobe change.   Original Report Authenticated By: Esperanza Heir, M.D.    Assessment/Plan  1. Sickle cell anemia with mild vaso- occlusive crises -admit to Med-Surg bed -Supportive care, IVF, O2, Narcotics -continue folic acid and hydroxyurea -check LDH and add on LFTS  2. Chest pain -across ribs-due to 1 -do not suspect ACS or PE or acute chest syndrome  3. Depression -continue Zoloft  4. Mild Leukocytosis -chronic vs reactive -monitor  Transfer to Dr.Matthews Team in am  Code Status: Full Family  Communication: none at bedside Disposition Plan: inpatient  Time spent:  [facility] Pager [phone]  If 7PM-7AM, please contact night-coverage [email or web address] Password [email or web address] 01/27/2013, 1:51 PM

## 2013-01-28 DIAGNOSIS — D5700 Hb-SS disease with crisis, unspecified: Principal | ICD-10-CM

## 2013-01-28 DIAGNOSIS — M549 Dorsalgia, unspecified: Secondary | ICD-10-CM

## 2013-01-28 LAB — COMPREHENSIVE METABOLIC PANEL
ALT: 10 U/L (ref 0–35)
AST: 19 U/L (ref 0–37)
Albumin: 3.5 g/dL (ref 3.5–5.2)
CO2: 25 mEq/L (ref 19–32)
Calcium: 9 mg/dL (ref 8.4–10.5)
Chloride: 102 mEq/L (ref 96–112)
GFR calc non Af Amer: >90 mL/min (ref >90)
Sodium: 136 mEq/L (ref 135–145)
Total Bilirubin: 1.4 mg/dL — ABNORMAL HIGH (ref 0.3–1.2)

## 2013-01-28 LAB — CBC
Hemoglobin: 8.7 g/dL — ABNORMAL LOW (ref 12.0–15.0)
MCH: 32.3 pg (ref 26.0–34.0)
MCHC: 34.8 g/dL (ref 30.0–36.0)
MCV: 92.9 fL (ref 78.0–100.0)

## 2013-01-28 MED ORDER — KETOROLAC TROMETHAMINE 30 MG/ML IJ SOLN
30.0000 mg | Freq: Four times a day (QID) | INTRAMUSCULAR | Status: DC
  Administered 2013-01-28 – 2013-01-30 (×8): 30 mg via INTRAVENOUS
  Filled 2013-01-28 (×13): qty 1

## 2013-01-28 MED ORDER — SENNOSIDES-DOCUSATE SODIUM 8.6-50 MG PO TABS
1.0000 | ORAL_TABLET | Freq: Two times a day (BID) | ORAL | Status: DC
  Administered 2013-01-28 – 2013-01-30 (×5): 1 via ORAL
  Filled 2013-01-28 (×6): qty 1

## 2013-01-28 MED ORDER — HYDROMORPHONE HCL PF 2 MG/ML IJ SOLN
3.0000 mg | INTRAMUSCULAR | Status: DC
  Administered 2013-01-28 – 2013-01-30 (×22): 3 mg via INTRAVENOUS
  Filled 2013-01-28 (×22): qty 2

## 2013-01-28 MED ORDER — DEXTROSE-NACL 5-0.45 % IV SOLN
INTRAVENOUS | Status: DC
  Administered 2013-01-28 – 2013-01-30 (×3): via INTRAVENOUS

## 2013-01-28 NOTE — Progress Notes (Signed)
Nutrition Brief Note  Patient identified on the Malnutrition Screening Tool (MST) Report  Wt Readings from Last 10 Encounters:  01/28/13 190 lb (86.183 kg)  01/06/13 194 lb 14.2 oz (88.4 kg)  12/04/12 192 lb (87.091 kg)  11/20/12 185 lb (83.915 kg)  11/05/12 192 lb (87.091 kg)  10/29/12 191 lb (86.637 kg)  10/15/12 190 lb (86.183 kg)  10/12/12 188 lb 11.4 oz (85.6 kg)  08/31/12 185 lb 6.4 oz (84.097 kg)  07/25/12 190 lb 14.4 oz (86.592 kg)    Body mass index is 34.74 kg/(m^2). Patient meets criteria for class I obesity based on current BMI.   Current diet order is regular, patient is consuming approximately 50-100% of meals at this time. Labs and medications reviewed. Pt with slightly elevated total bilirubin. Met with pt, who would not answer how she was eating, only starting to tear up and c/o pain - notified RN. Pt known to RD from last admission last month, pt was eating well at that time and currently eating well.   No nutrition interventions warranted at this time. If nutrition issues arise, please consult RD.   Levon Hedger MS, RD, LDN [phone] Pager [phone] After Hours Pager

## 2013-01-28 NOTE — Progress Notes (Signed)
SICKLE CELL SERVICE PROGRESS NOTE  [name]:[id number] DOB: 10/28/1990 DOA: 01/27/2013 PCP: Taher Vannote A., MD  Assessment/Plan: Active Problems:   Depression   Chronic back pain  1. Hb SS with Crisis: Pt presented with pain in elbow and today in back which she describes as characteristic of her pain of Sickle Cell disease. Pain is 7/10 and throbbing in nature. She has no N/V/D or any other associated symptoms. Will schedule Dilaudid every 2 hours and add Toradol. continue hydration for the next 24 hours and reassess. 2. Depression: Continue Zoloft. 3. Chronic Pain: Pt has chronic pain associated with Hb however she takes on PRN oxycodone. She is trying to wean her narcotic medications even further as she is committed to getting to the lowest effective dose. I will further discuss in the out patient setting when the  Patient is out of crisis.  Code Status: Full Code Family Communication: N/A Disposition Plan: Home in 48-72 hours  Zvi Duplantis A.  Pager [phone]. If 7PM-7AM, please contact night-coverage.  01/28/2013, 9:42 AM  LOS: 1 day   Brief narrative: [name] is a 22 y.o. female with h/o sickle cell disease, Depression presented to the ED at [facility] today with uncontrolled R elbow pain, low back pain and pain across her chest since yesterday night.  She denies any fevers or chills, reports compliance with medications.  Upon evaluation in ER, noted to have stable labs, Bili pending.  Still in moderately severe pain after 3 doses of IV dilaudid and hence TRH consulted for further evaluation and management.  Consultants:  None  Procedures:  None  Antibiotics:  None  HPI/Subjective: Pt c/o pain in right elbow anda back. Throbbing in nature. No N/V/D. Intensity of 7-8/10 and decreasing to 6/10 with current medications.  Objective: Filed Vitals:   01/27/13 1741 01/27/13 2125 01/28/13 0234 01/28/13 0555  BP: 123/65 117/66 108/46 112/49   Pulse:  110 89 95  Temp: 98.2 F (36.8 C) 98.5 F (36.9 C) 99 F (37.2 C) 99 F (37.2 C)  TempSrc: Oral Oral Oral Oral  Resp: 16 18 16 18   Height:      Weight:    190 lb (86.183 kg)  SpO2: 97% 92% 98% 99%   Weight change:   Intake/Output Summary (Last 24 hours) at 01/28/13 0942 Last data filed at 01/28/13 0800  Gross per 24 hour  Intake   2425 ml  Output    300 ml  Net   2125 ml    General: Alert, awake, oriented x3, in moderate acute distress.  HEENT: [place]/AT PEERL, EOM, anicteric Heart: Regular rate and rhythm, without murmurs, rubs, gallops.  Lungs: Clear to auscultation, no wheezing or rhonchi noted.  Abdomen: Soft, nontender, nondistended, positive bowel sounds, no masses no hepatosplenomegaly noted.  Neuro: No focal neurological deficits noted cranial nerves II through XII grossly intact. . Strength normal in bilateral upper and lower extremities. Musculoskeletal: No warm swelling or erythema around joints, no spinal tenderness noted. Psychiatric: Patient alert and oriented x3, good insight and cognition, good recent to remote recall.  Data Reviewed: Basic Metabolic Panel:  Recent Labs Lab 01/27/13 0809 01/28/13 0345  NA 139 136  K 3.7 4.3  CL 104 102  CO2 25 25  GLUCOSE 92 103*  BUN 5* 8  CREATININE 0.63 0.63  CALCIUM 8.9 9.0   Liver Function Tests:  Recent Labs Lab 01/27/13 0809 01/28/13 0345  AST 20 19  ALT 11 10  ALKPHOS 70 75  BILITOT 1.2 1.4*  PROT 7.4 7.1  ALBUMIN 3.8 3.5   No results found for this basename: LIPASE, AMYLASE,  in the last 168 hours No results found for this basename: AMMONIA,  in the last 168 hours CBC:  Recent Labs Lab 01/27/13 0809 01/28/13 0345  WBC 13.2* 12.4*  NEUTROABS 7.1  --   HGB 9.6* 8.7*  HCT 26.8* 25.0*  MCV 91.2 92.9  PLT 540* 460*   Cardiac Enzymes:  Recent Labs Lab 01/27/13 0820  TROPONINI <0.30   BNP (last 3 results) No results found for this basename: PROBNP,  in the last 8760  hours CBG: No results found for this basename: GLUCAP,  in the last 168 hours  No results found for this or any previous visit (from the past 240 hour(s)).   Studies: Dg Chest 2 View  01/27/2013   *RADIOLOGY REPORT*  Clinical Data: Sickle cell crisis, chest pain  CHEST - 2 VIEW  Comparison: 01/01/2013  Findings: Heart size and vascular pattern are normal.  Mild right upper lobe atelectasis.  Lungs otherwise clear.  No pleural effusions.  Scoliosis stable.  IMPRESSION: Mild hypoventilatory right upper lobe change.   Original Report Authenticated By: Esperanza Heir, M.D.     Scheduled Meds: . enoxaparin (LOVENOX) injection  40 mg Subcutaneous Q24H  . folic acid  1 mg Oral Daily  .  HYDROmorphone (DILAUDID) injection  3 mg Intravenous Q2H  . hydroxyurea  1,000 mg Oral QHS  . ketorolac  30 mg Intravenous Q6H  . prenatal multivitamin  1 tablet Oral Q1200  . sertraline  50 mg Oral Daily   Continuous Infusions: . sodium chloride 100 mL/hr at 01/28/13 [phone]

## 2013-01-29 DIAGNOSIS — M549 Dorsalgia, unspecified: Secondary | ICD-10-CM | POA: Diagnosis not present

## 2013-01-29 DIAGNOSIS — D5700 Hb-SS disease with crisis, unspecified: Secondary | ICD-10-CM | POA: Diagnosis not present

## 2013-01-29 LAB — CBC WITH DIFFERENTIAL/PLATELET
Basophils Absolute: 0.1 10*3/uL (ref 0.0–0.1)
Eosinophils Absolute: 0.6 10*3/uL (ref 0.0–0.7)
HCT: 23.8 % — ABNORMAL LOW (ref 36.0–46.0)
Lymphs Abs: 2.6 10*3/uL (ref 0.7–4.0)
MCHC: 34.9 g/dL (ref 30.0–36.0)
MCV: 92.2 fL (ref 78.0–100.0)
Monocytes Absolute: 1.2 10*3/uL — ABNORMAL HIGH (ref 0.1–1.0)
Monocytes Relative: 8 % (ref 3–12)
Neutro Abs: 10 10*3/uL — ABNORMAL HIGH (ref 1.7–7.7)
Platelets: ADEQUATE 10*3/uL (ref 150–400)
RDW: 18.5 % — ABNORMAL HIGH (ref 11.5–15.5)
WBC: 14.5 10*3/uL — ABNORMAL HIGH (ref 4.0–10.5)

## 2013-01-29 MED ORDER — OXYCODONE HCL 5 MG PO TABS
15.0000 mg | ORAL_TABLET | Freq: Three times a day (TID) | ORAL | Status: DC
  Administered 2013-01-29 – 2013-01-30 (×4): 15 mg via ORAL
  Filled 2013-01-29 (×4): qty 3

## 2013-01-29 NOTE — Progress Notes (Signed)
SICKLE CELL SERVICE PROGRESS NOTE  [name] ZOX:[id number] DOB: 1990-11-27 DOA: 01/27/2013 PCP: Kable Haywood A., MD  Assessment/Plan: Active Problems:   Depression   Chronic back pain  1. Hb SS with Crisis: Pt presented with pain in elbow and today in back which she describes as characteristic of her pain of Sickle Cell disease. Pain is 7/10 and throbbing in nature. She has no N/V/D or any other associated symptoms. Will schedule oxycodone 15 mg q 8 hours and continue Toradol. Continue PRN dilaudid for breakthrough pain. Anticipate discharge home tomorrow. Follow up in clinic at appointment on 02/05/2103. 2. Depression: Continue Zoloft. 3. Chronic Pain: Pt has chronic pain associated with Hb however she takes on PRN oxycodone. She is to continue home regimen of 15 mg BID and 1 dose of 10 mg between those two doses on a q 6 hour PRN basis  Code Status: Full Code Family Communication: N/A Disposition Plan: Anticipate discharge Home tomorrow  Quintina Hakeem A.  Pager [phone]. If 7PM-7AM, please contact night-coverage.  01/29/2013, 10:53 AM  LOS: 2 days   Brief narrative: [name] is a 22 y.o. female with h/o sickle cell disease, Depression presented to the ED at [facility] today with uncontrolled R elbow pain, low back pain and pain across her chest since yesterday night.  She denies any fevers or chills, reports compliance with medications.  Upon evaluation in ER, noted to have stable labs, Bili pending.  Still in moderately severe pain after 3 doses of IV dilaudid and hence TRH consulted for further evaluation and management.  Consultants:  None  Procedures:  None  Antibiotics:  None  HPI/Subjective: Pt c/o pain in right elbow anda back. Throbbing in nature. No N/V/D. Intensity of 7-8/10 and decreasing to 6/10 with current medications.  Objective: Filed Vitals:   01/28/13 1354 01/28/13 2218 01/29/13 0240 01/29/13 0625  BP: 104/62 121/88 109/52  107/62  Pulse: 97 83 84 98  Temp: 99.3 F (37.4 C) 99 F (37.2 C) 98.2 F (36.8 C) 98.9 F (37.2 C)  TempSrc:  Oral Oral Oral  Resp: 16 18 18 18   Height:      Weight:    190 lb 3 oz (86.268 kg)  SpO2: 97% 98% 100% 98%   Weight change: 15 lb 3 oz (6.889 kg)  Intake/Output Summary (Last 24 hours) at 01/29/13 1053 Last data filed at 01/29/13 0900  Gross per 24 hour  Intake   1946 ml  Output   2950 ml  Net  -1004 ml    General: Alert, awake, oriented x3, in moderate acute distress.  HEENT: [place]/AT PEERL, EOM, anicteric Heart: Regular rate and rhythm, without murmurs, rubs, gallops.  Lungs: Clear to auscultation, no wheezing or rhonchi noted.  Abdomen: Soft, nontender, nondistended, positive bowel sounds, no masses no hepatosplenomegaly noted.  Neuro: No focal neurological deficits noted cranial nerves II through XII grossly intact. . Strength normal in bilateral upper and lower extremities. Musculoskeletal: No warm swelling or erythema around joints, no spinal tenderness noted. Psychiatric: Patient alert and oriented x3, good insight and cognition, good recent to remote recall.  Data Reviewed: Basic Metabolic Panel:  Recent Labs Lab 01/27/13 0809 01/28/13 0345  NA 139 136  K 3.7 4.3  CL 104 102  CO2 25 25  GLUCOSE 92 103*  BUN 5* 8  CREATININE 0.63 0.63  CALCIUM 8.9 9.0   Liver Function Tests:  Recent Labs Lab 01/27/13 0809 01/28/13 0345  AST 20 19  ALT 11 10  ALKPHOS 70 75  BILITOT 1.2 1.4*  PROT 7.4 7.1  ALBUMIN 3.8 3.5   No results found for this basename: LIPASE, AMYLASE,  in the last 168 hours No results found for this basename: AMMONIA,  in the last 168 hours CBC:  Recent Labs Lab 01/27/13 0809 01/28/13 0345 01/29/13 1015  WBC 13.2* 12.4* 14.5*  NEUTROABS 7.1  --  10.0*  HGB 9.6* 8.7* 8.3*  HCT 26.8* 25.0* 23.8*  MCV 91.2 92.9 92.2  PLT 540* 460* PLATELET CLUMPS NOTED ON SMEAR, COUNT APPEARS ADEQUATE   Cardiac Enzymes:  Recent Labs Lab  01/27/13 0820  TROPONINI <0.30   BNP (last 3 results) No results found for this basename: PROBNP,  in the last 8760 hours CBG: No results found for this basename: GLUCAP,  in the last 168 hours  No results found for this or any previous visit (from the past 240 hour(s)).   Studies: Dg Chest 2 View  01/27/2013   *RADIOLOGY REPORT*  Clinical Data: Sickle cell crisis, chest pain  CHEST - 2 VIEW  Comparison: 01/01/2013  Findings: Heart size and vascular pattern are normal.  Mild right upper lobe atelectasis.  Lungs otherwise clear.  No pleural effusions.  Scoliosis stable.  IMPRESSION: Mild hypoventilatory right upper lobe change.   Original Report Authenticated By: Esperanza Heir, M.D.     Scheduled Meds: . enoxaparin (LOVENOX) injection  40 mg Subcutaneous Q24H  . folic acid  1 mg Oral Daily  .  HYDROmorphone (DILAUDID) injection  3 mg Intravenous Q2H  . hydroxyurea  1,000 mg Oral QHS  . ketorolac  30 mg Intravenous Q6H  . oxyCODONE  15 mg Oral Q8H  . prenatal multivitamin  1 tablet Oral Q1200  . senna-docusate  1 tablet Oral BID  . sertraline  50 mg Oral Daily   Continuous Infusions: . dextrose 5 % and 0.45% NaCl 75 mL/hr at 01/29/13 0200

## 2013-01-30 DIAGNOSIS — G8929 Other chronic pain: Secondary | ICD-10-CM | POA: Diagnosis not present

## 2013-01-30 DIAGNOSIS — F329 Major depressive disorder, single episode, unspecified: Secondary | ICD-10-CM

## 2013-01-30 DIAGNOSIS — D649 Anemia, unspecified: Secondary | ICD-10-CM | POA: Diagnosis not present

## 2013-01-30 DIAGNOSIS — D5700 Hb-SS disease with crisis, unspecified: Secondary | ICD-10-CM | POA: Diagnosis not present

## 2013-01-30 MED ORDER — OXYCODONE HCL 5 MG PO TABS: 5.0000 mg | ORAL_TABLET | Freq: Four times a day (QID) | ORAL | Status: DC | PRN

## 2013-01-30 MED ORDER — OXYCODONE HCL 15 MG PO TABS: ORAL_TABLET | ORAL | Status: DC

## 2013-01-30 NOTE — Discharge Summary (Addendum)
Physician Discharge Summary  [name] ZOX:[id number] DOB: 01-27-1991 DOA: 01/27/2013  PCP: MATTHEWS,MICHELLE A., MD  Admit date: 01/27/2013 Discharge date: 01/30/2013  Time spent: *50 minutes  Recommendations for Outpatient Follow-up:  1. Follow up Scc in one week  Discharge Diagnoses:  Active Problems:   Depression   Sickle cell pain crisis   Sickle cell hemolytic anemia   Chronic back pain   Discharge Condition: Stable  Diet recommendation: Regular diet  Filed Weights   01/27/13 1300 01/28/13 0555 01/29/13 0625  Weight: 86.1 kg (189 lb 13.1 oz) 86.183 kg (190 lb) 86.268 kg (190 lb 3 oz)    History of present illness:  22 yo AAF who presented to the [facility] ED tonight with increased/uncontrolled back and left arm pain consistent with her usual sickle cell crisis pain. She also c/o sorethroat, HA, and nausea for 1 day. Pain goal at home is 3/10 and she can function normally at that level. She takes Oxycodone 5mg  and 15mg  alternating q6hrs at home. Last dose 11am today. Last refill 12/18/12 #60 each in sickle cell clinic. Last visit to clinic 12/10/12 and last overnight stay 11/20/12. Tonight, she rates her back and left arm pain as 8/10, throbbing, dull and constant. No other pain, including no chest pain.  In the ED, was found to have a positive rapid strep test and was given one dose of 500mg  Amoxicillin. Neg preg test. UA pending. CXR neg for acute issues/acute chest syndrome. WBCC elevated with low grade fever. Bili 1.8, retic count elevated at 326, Hgb 9.4 (baseline 8.8-10.9 in chart), and creatinine normal. Patient was admitted for pain management.    Hospital Course:   Hb SS with Crisis: Pt presented with pain in elbow and today in back which she describes as characteristic of her pain of Sickle Cell disease. Pain is 7/10 and throbbing in nature. She has no N/V/D or any other associated symptoms.Pateint was started on  scheduled oxycodone 15 mg q 8 hours and  Toradol. PRN  dilaudid for breakthrough pain. At this time her pain is 5/10 and she feels that she can manage her pain at home with Po Oxycodone.  Depression: Continue Zoloft. Discussed with patient other non pharmacological modalities for handling with stress, patient alco feels that she carries lot of tension in her back muscles, and wanted to se if we could get her coupon for chiropractor and other resources in the community.   Chronic Pain: Will give her the prescriptions of her home regimen including Oxycodone 15 mg po BID and 5 mg Po q 6 hr prn.  Muscle tension- patient feels that she gets good relief with oxycodone   Follow up in clinic at appointment on 02/05/2103.  Procedures:  None  Consultations:  None  Discharge Exam: Filed Vitals:   01/30/13 0547  BP: 109/65  Pulse: 79  Temp: 98 F (36.7 C)  Resp: 16    General: Appear in no acute distress Cardiovascular: S1S2 RRR Upper back: Slight tension in upper back muscles with mild generalized tenderness on palpation Respiratory: Clear bilaterally Abdomen: Soft, nontender Ext : No edema  Discharge Instructions  Discharge Orders   Future Appointments Provider Department Dept Phone   02/04/2013 2:00 PM Altha Harm, MD [place] [facility] [phone]   Future Orders Complete By Expires   Diet - low sodium heart healthy  As directed    Increase activity slowly  As directed        Medication List  folic acid 800 MCG tablet  Commonly known as:  FOLVITE  Take 800 mcg by mouth every morning.     hydroxyurea 500 MG capsule  Commonly known as:  HYDREA  Take 2 capsules (1,000 mg total) by mouth at bedtime. May take with food to minimize GI side effects.     oxyCODONE 5 MG immediate release tablet  Commonly known as:  ROXICODONE  Take 1 tablet (5 mg total) by mouth 4 (four) times daily as needed for pain. May take 1 tablet by mouth every 6 hours as needed for PAIN     oxyCODONE 15 MG immediate release  tablet  Commonly known as:  ROXICODONE  15 mg every 12 hrs     polyethylene glycol packet  Commonly known as:  MIRALAX / GLYCOLAX  Take 17 g by mouth daily as needed (for constipation).     prenatal multivitamin Tabs tablet  Take 1 tablet by mouth daily at 12 noon.     sertraline 50 MG tablet  Commonly known as:  ZOLOFT  Take 1 tablet (50 mg total) by mouth daily.       Allergies  Allergen Reactions  . Carrot Oil Hives and Swelling  . Latex Rash      The results of significant diagnostics from this hospitalization (including imaging, microbiology, ancillary and laboratory) are listed below for reference.    Significant Diagnostic Studies: Dg Chest 2 View  01/27/2013   *RADIOLOGY REPORT*  Clinical Data: Sickle cell crisis, chest pain  CHEST - 2 VIEW  Comparison: 01/01/2013  Findings: Heart size and vascular pattern are normal.  Mild right upper lobe atelectasis.  Lungs otherwise clear.  No pleural effusions.  Scoliosis stable.  IMPRESSION: Mild hypoventilatory right upper lobe change.   Original Report Authenticated By: Esperanza Heir, M.D.   Dg Chest 2 View  12/31/2012   CLINICAL DATA:  Sickle cell pain crisis. Sickle cell disease. Sore throat.  EXAM: CHEST  2 VIEW  COMPARISON:  10/05/2012  FINDINGS: The heart size and mediastinal contours are within normal limits. Both lungs are clear. Chronic skeletal changes of sickle cell disease are again noted.  IMPRESSION: No active cardiopulmonary disease.   Electronically Signed   By: Myles Rosenthal   On: 12/31/2012 17:04   Dg Chest Port 1 View  01/01/2013   *RADIOLOGY REPORT*  Clinical Data: Sickle cell, chest pain.  PORTABLE CHEST - 1 VIEW  Comparison: 12/31/2012.  Findings: Trachea is midline.  Heart size stable.  Mild accentuation of the pulmonary markings may be technical in etiology.  No focal airspace consolidation or pleural fluid. Sickle cell changes are seen in the humeral heads bilaterally.  IMPRESSION: Mild accentuation of  pulmonary markings may be technical in etiology.  Difficult to exclude trace edema.   Original Report Authenticated By: Leanna Battles, M.D.    Microbiology: No results found for this or any previous visit (from the past 240 hour(s)).   Labs: Basic Metabolic Panel:  Recent Labs Lab 01/27/13 0809 01/28/13 0345  NA 139 136  K 3.7 4.3  CL 104 102  CO2 25 25  GLUCOSE 92 103*  BUN 5* 8  CREATININE 0.63 0.63  CALCIUM 8.9 9.0   Liver Function Tests:  Recent Labs Lab 01/27/13 0809 01/28/13 0345  AST 20 19  ALT 11 10  ALKPHOS 70 75  BILITOT 1.2 1.4*  PROT 7.4 7.1  ALBUMIN 3.8 3.5   No results found for this basename: LIPASE, AMYLASE,  in the last  168 hours No results found for this basename: AMMONIA,  in the last 168 hours CBC:  Recent Labs Lab 01/27/13 0809 01/28/13 0345 01/29/13 1015  WBC 13.2* 12.4* 14.5*  NEUTROABS 7.1  --  10.0*  HGB 9.6* 8.7* 8.3*  HCT 26.8* 25.0* 23.8*  MCV 91.2 92.9 92.2  PLT 540* 460* PLATELET CLUMPS NOTED ON SMEAR, COUNT APPEARS ADEQUATE   Cardiac Enzymes:  Recent Labs Lab 01/27/13 0820  TROPONINI <0.30   BNP: BNP (last 3 results) No results found for this basename: PROBNP,  in the last 8760 hours CBG: No results found for this basename: GLUCAP,  in the last 168 hours     Signed:  Shaquana Buel S  [facility] 01/30/2013, 9:53 AM

## 2013-01-30 NOTE — Progress Notes (Signed)
RNCM reported that pt requesting resources for utility assistance.   CSW provided utility assistance resources to [facility] who provided to pt.  No further social work needs identified at this time.  CSW signing off.   Jacklynn Lewis, MSW, LCSWA  Clinical Social Work [phone]

## 2013-01-30 NOTE — Care Management Note (Signed)
Cm spoke with patient at the bedside concerning barriers expressed by pt to MD. Pt communicated to Cm recently obtaining housing assistance, needs assistance with utilities and security deposits.Cm provided pt with community resources provided by CSW. Pt expressed concerns of wanting a chiropractor, Cm advised pt to speak with PCP concerning referral.   Roland Earl [phone]

## 2013-01-31 ENCOUNTER — Encounter: Payer: Self-pay | Admitting: Internal Medicine

## 2013-02-04 ENCOUNTER — Ambulatory Visit: Payer: Medicare Other | Admitting: Internal Medicine

## 2013-02-10 ENCOUNTER — Inpatient Hospital Stay (HOSPITAL_COMMUNITY)
Admission: EM | Admit: 2013-02-10 | Discharge: 2013-02-14 | DRG: 811 | Disposition: A | Discharge status: Home / Self Care | Payer: Medicare Other | Attending: Internal Medicine | Admitting: Internal Medicine

## 2013-02-10 ENCOUNTER — Encounter (HOSPITAL_COMMUNITY): Payer: Self-pay | Admitting: *Deleted

## 2013-02-10 DIAGNOSIS — F17200 Nicotine dependence, unspecified, uncomplicated: Secondary | ICD-10-CM | POA: Diagnosis present

## 2013-02-10 DIAGNOSIS — Z6832 Body mass index (BMI) 32.0-32.9, adult: Secondary | ICD-10-CM | POA: Diagnosis not present

## 2013-02-10 DIAGNOSIS — K5900 Constipation, unspecified: Secondary | ICD-10-CM | POA: Diagnosis present

## 2013-02-10 DIAGNOSIS — F329 Major depressive disorder, single episode, unspecified: Secondary | ICD-10-CM | POA: Diagnosis present

## 2013-02-10 DIAGNOSIS — F3289 Other specified depressive episodes: Secondary | ICD-10-CM | POA: Diagnosis not present

## 2013-02-10 DIAGNOSIS — R059 Cough, unspecified: Secondary | ICD-10-CM | POA: Diagnosis not present

## 2013-02-10 DIAGNOSIS — M545 Low back pain, unspecified: Secondary | ICD-10-CM | POA: Diagnosis present

## 2013-02-10 DIAGNOSIS — R0989 Other specified symptoms and signs involving the circulatory and respiratory systems: Secondary | ICD-10-CM | POA: Diagnosis not present

## 2013-02-10 DIAGNOSIS — D5700 Hb-SS disease with crisis, unspecified: Secondary | ICD-10-CM | POA: Diagnosis not present

## 2013-02-10 DIAGNOSIS — R05 Cough: Secondary | ICD-10-CM | POA: Diagnosis not present

## 2013-02-10 DIAGNOSIS — G8929 Other chronic pain: Secondary | ICD-10-CM | POA: Diagnosis not present

## 2013-02-10 DIAGNOSIS — E43 Unspecified severe protein-calorie malnutrition: Secondary | ICD-10-CM | POA: Diagnosis not present

## 2013-02-10 DIAGNOSIS — D72829 Elevated white blood cell count, unspecified: Secondary | ICD-10-CM | POA: Diagnosis present

## 2013-02-10 DIAGNOSIS — F32A Depression, unspecified: Secondary | ICD-10-CM

## 2013-02-10 IMAGING — US US OB COMP LESS 14 WK
1 series · 14 of 28 positions shown · non-contrast
Comparison: None.

CLINICAL DATA: check for intrauterine preg,pain; ; PELVIC PAIN.

OBSTETRIC <14 WK US AND TRANSVAGINAL OB US
TECHNIQUE: Both transabdominal and transvaginal ultrasound
examinations were performed for complete evaluation of the
gestation as well as the maternal uterus, adnexal regions, and
pelvic cul-de-sac.

[Series 1: us ob comp less 14 wk · 0.26mm/px · 84 acquisitions, 14 frames shown]
[im 4/84]
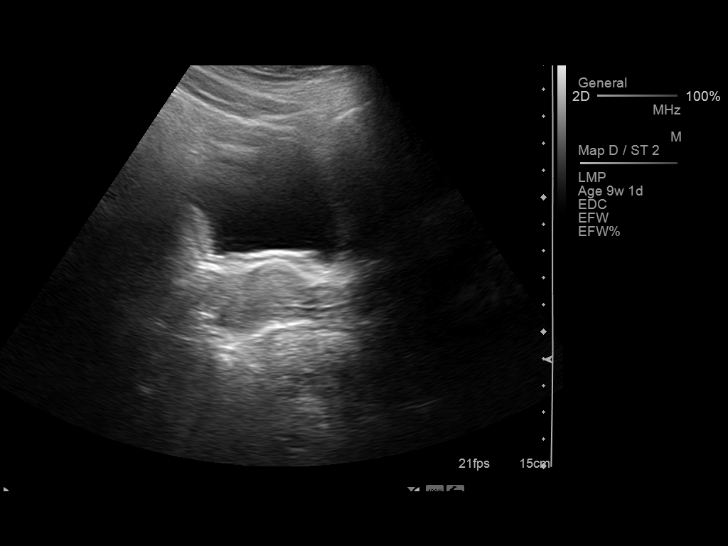
[im 10/84]
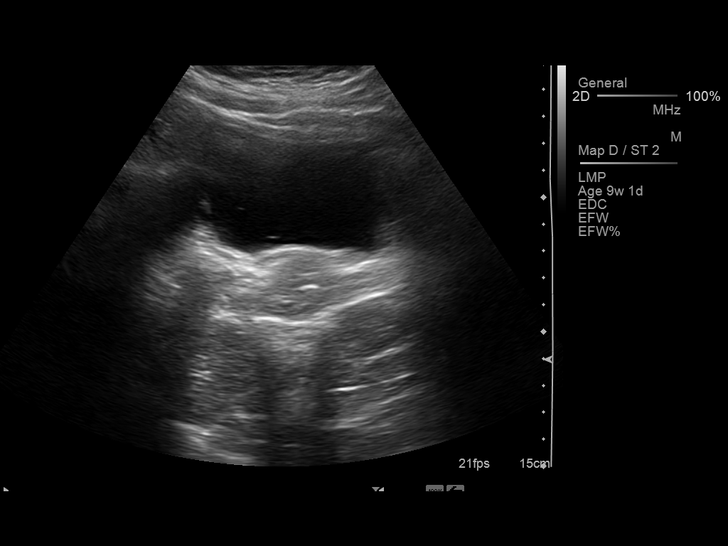
[im 16/84]
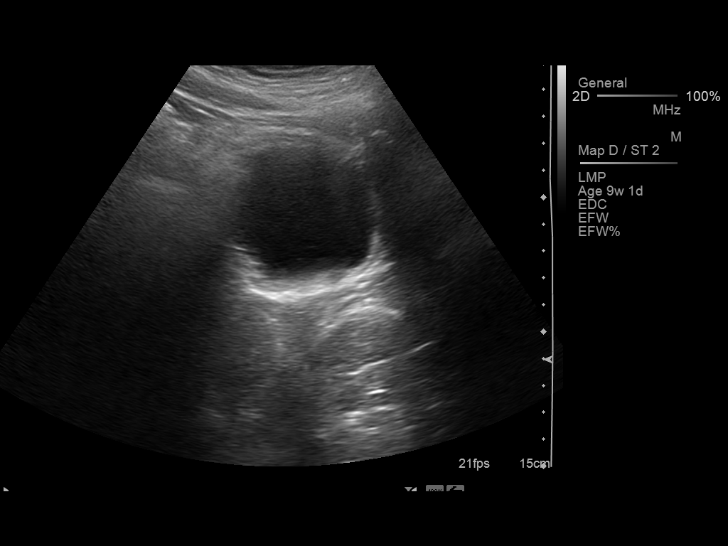
[im 22/84]
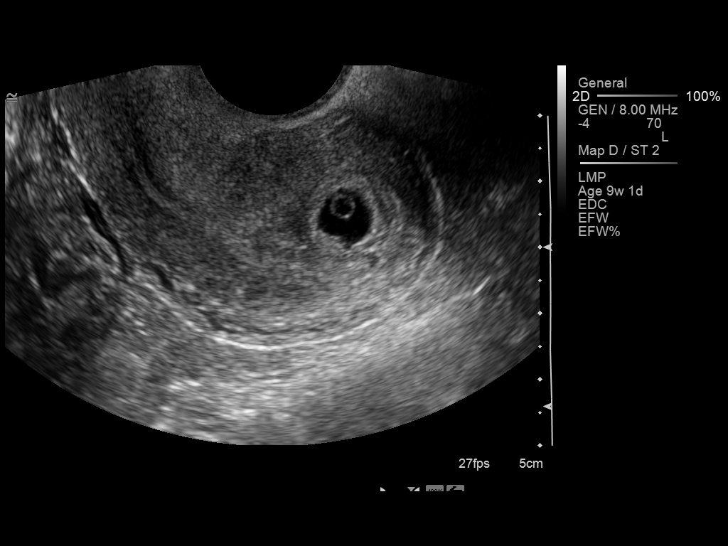
[im 28/84]
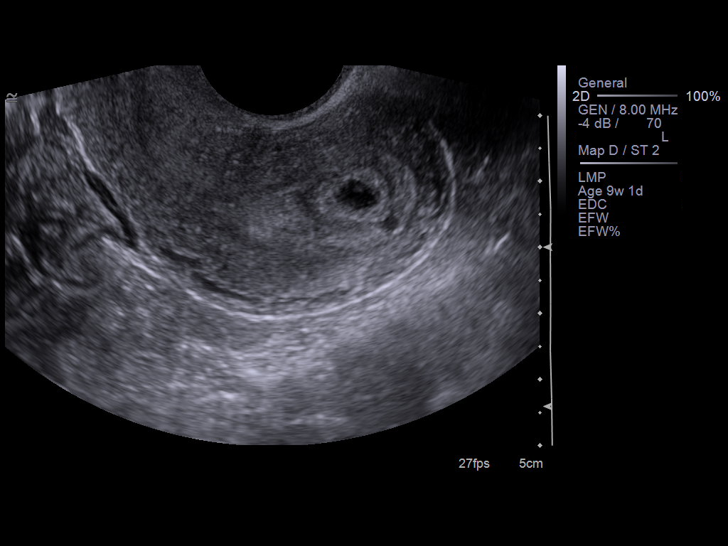
[im 34/84]
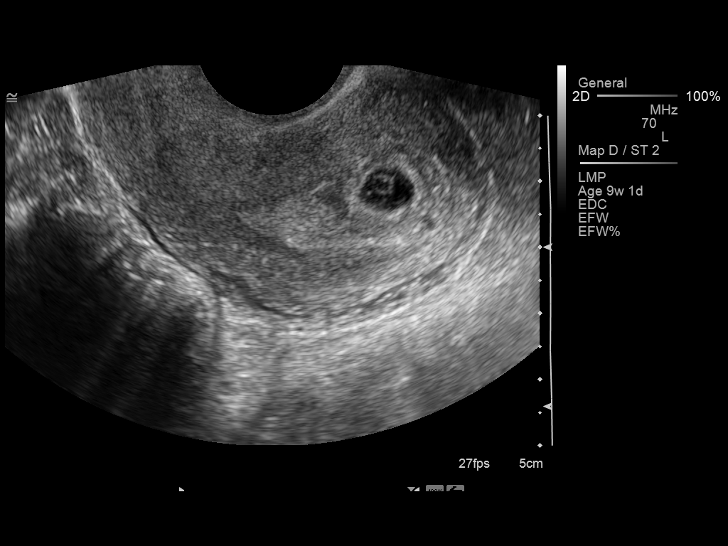
[im 40/84]
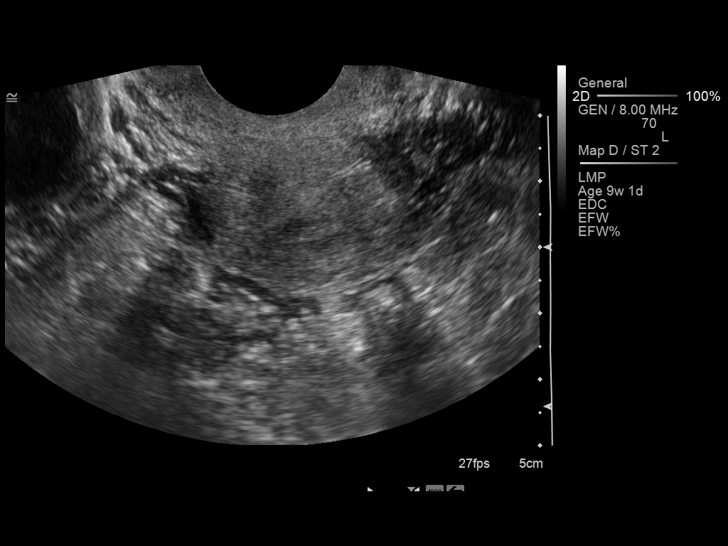
[im 47/84]
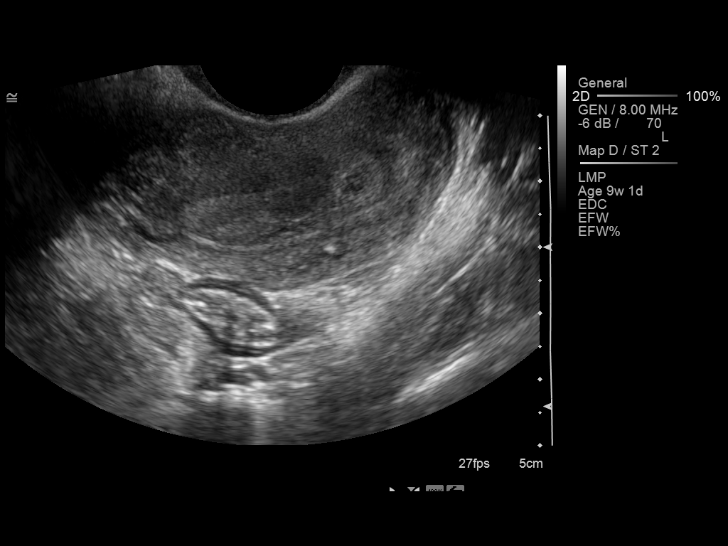
[im 53/84]
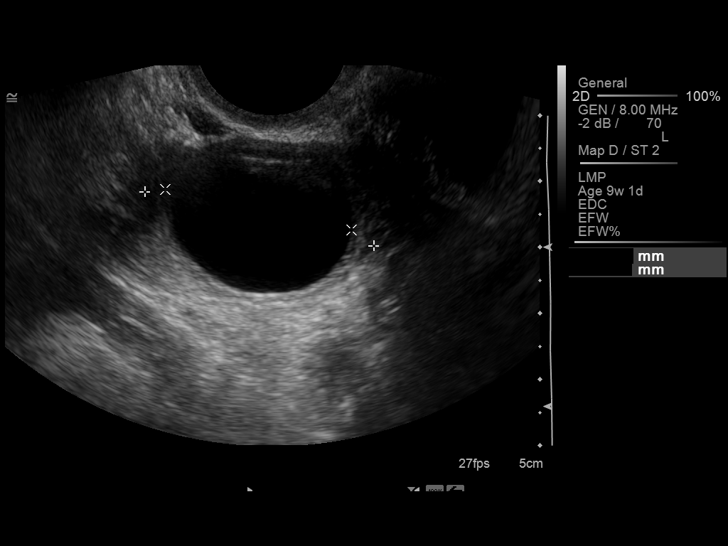
[im 59/84]
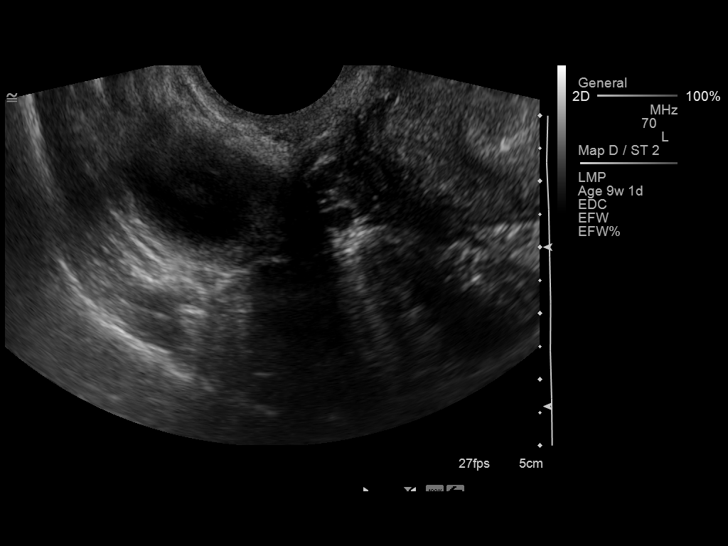
[im 65/84]
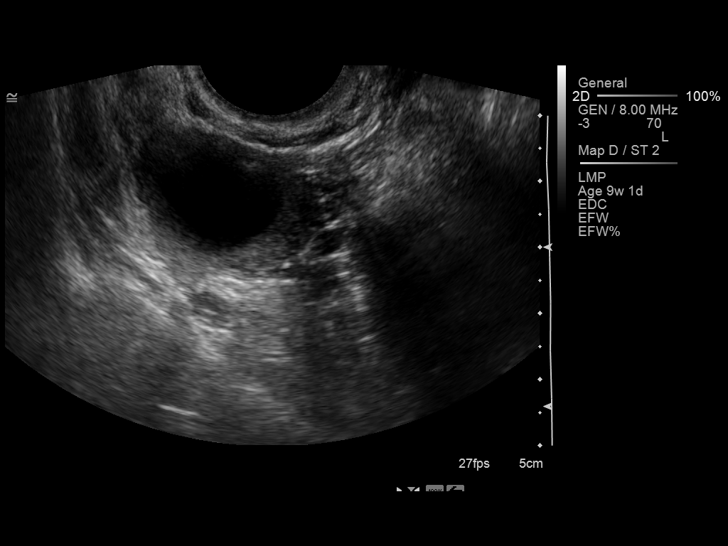
[im 71/84]
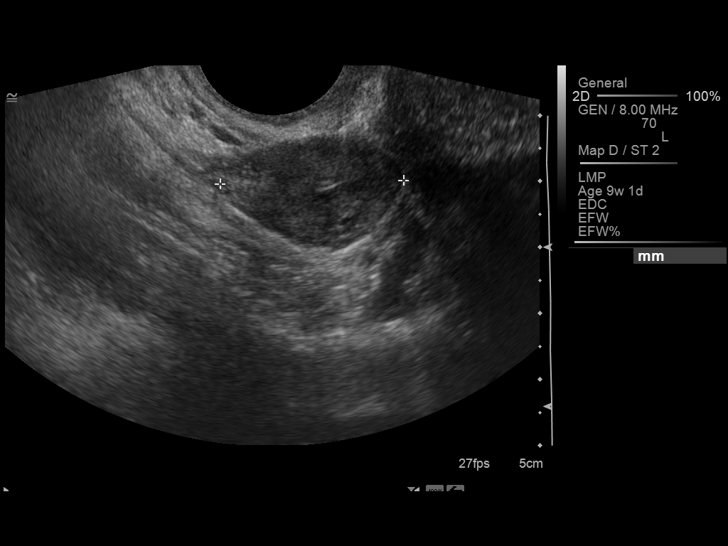
[im 77/84]
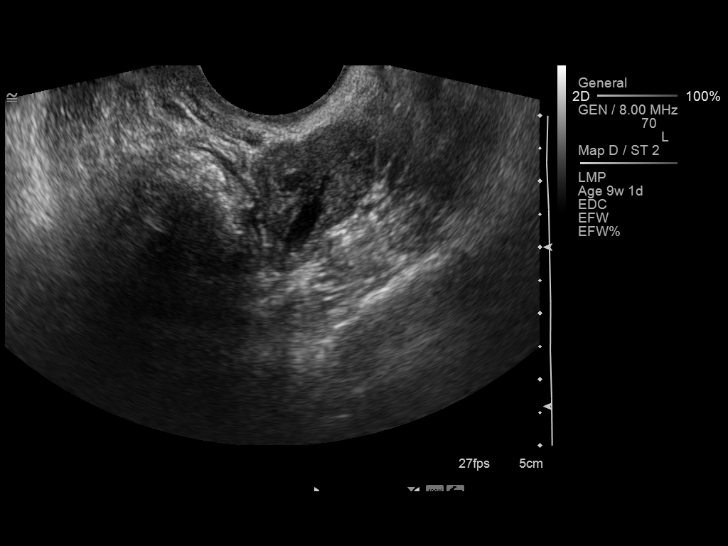
[im 84/84]
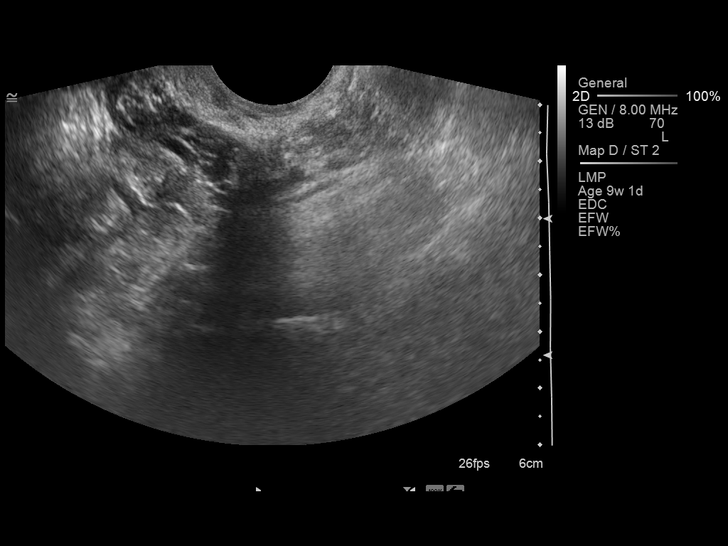

[14 of 28 positions shown; findings below may reference images not displayed]

FINDINGS: Single intrauterine gestation.  Mean sac diameter is
mm for estimated gestational age of 5 weeks 4 days.  No fetal pole
currently.  Yolk sac is visualized.  Small subchorionic hemorrhage
present.

2.9 cm simple appearing cyst in the right ovary.  Left corpus
luteal cyst.  No free fluid.
IMPRESSION: 5-week-9-day intrauterine pregnancy.  No fetal pole currently.
Small subchorionic hemorrhage.

## 2013-02-10 MED ORDER — ONDANSETRON HCL 4 MG/2ML IJ SOLN
4.0000 mg | Freq: Once | INTRAMUSCULAR | Status: AC
  Administered 2013-02-10: 4 mg via INTRAVENOUS
  Filled 2013-02-10: qty 2

## 2013-02-10 MED ORDER — SODIUM CHLORIDE 0.9 % IV BOLUS (SEPSIS)
1000.0000 mL | Freq: Once | INTRAVENOUS | Status: AC
  Administered 2013-02-10: 1000 mL via INTRAVENOUS

## 2013-02-10 MED ORDER — HYDROMORPHONE HCL PF 1 MG/ML IJ SOLN
1.0000 mg | Freq: Once | INTRAMUSCULAR | Status: AC
  Administered 2013-02-10: 1 mg via INTRAVENOUS
  Filled 2013-02-10: qty 1

## 2013-02-10 NOTE — ED Notes (Signed)
Pt states she has thigh pain,  She is out of oxycodone,  Hasn't seen Dr Ashley Royalty in a month

## 2013-02-10 NOTE — ED Notes (Signed)
Bed: ZO10 Expected date: 02/10/13 Expected time: 9:32 PM Means of arrival: Ambulance Comments: Bed 24, EMS, 22 F, Sickle Cell Crisis/Emesis

## 2013-02-10 NOTE — ED Notes (Signed)
Pt arrived via EMS from home  States she has pain in thighs and low back and thumbs,  She hasn't seen dr Ashley Royalty in a month,  She is out of oxycodone,  Pt also states she has lost weight due to nausea and stress level .  Pt is alert and oriented in NAD

## 2013-02-10 NOTE — ED Provider Notes (Signed)
CSN: [id number]     Arrival date & time 02/10/13  2141 History   First MD Initiated Contact with Patient 02/10/13 2145     Chief Complaint  Patient presents with  . Sickle Cell Pain Crisis   (Consider location/radiation/quality/duration/timing/severity/associated sxs/prior Treatment) Patient is a 22 y.o. female presenting with sickle cell pain. The history is provided by the patient.  Sickle Cell Pain Crisis Location:  Lower extremity and back Severity:  Moderate Onset quality:  Gradual Duration:  1 day Similar to previous crisis episodes: yes   Timing:  Constant Progression:  Worsening Chronicity:  Recurrent Sickle cell genotype:  SS Date of last transfusion:  Last week Frequency of attacks:  Monthly History of pulmonary emboli: no   Context: dehydration   Context: not alcohol consumption and not change in medication   Context comment:  States today she felt nauseated this am and vomited once but pain in the legs and back getting worse and took last dose pain meds this am and have not refilled them Relieved by:  Prescription drugs Worsened by:  Activity Associated symptoms: nausea and vomiting   Associated symptoms: no chest pain, no congestion, no cough, no fever, no headaches, no shortness of breath, no swelling of legs and no wheezing   Risk factors: frequent admissions for pain and frequent pain crises   Risk factors: no history of acute chest syndrome     Past Medical History  Diagnosis Date  . H/O: 1 miscarriage 03/22/2011  . 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 Depression 01/06/2011  . GERD (gastroesophageal reflux disease) 02/17/2011  . Trichotillomania     h/o  . Blood transfusion     "lots"  . Sickle cell anemia with crisis   . Exertional dyspnea     "sometimes"  . Sickle cell anemia   . Headache(784.0)   . Migraines 11/08/11    "@ least twice/month"  . Chronic back pain     "very severe; have knot in my back; from tight muscle; take RX and exercise for it"  . Mood  swings 11/08/11    "I go back and forth; real bad"  . Pregnancy   . Blood dyscrasia     SICKLE CELL   Past Surgical History  Procedure Laterality Date  . Cholecystectomy  05/2010  . Dilation and curettage of uterus  02/20/11    S/P miscarriage   Family History  Problem Relation Age of Onset  . Diabetes Mother   . Alcoholism    . Depression    . Hypercholesterolemia    . Hypertension    . Migraines    . Diabetes Maternal Grandmother   . Diabetes Paternal Grandmother    History  Substance Use Topics  . Smoking status: Current Every Day Smoker -- 0.25 packs/day for 1 years    Types: Cigarettes  . Smokeless tobacco: Never Used  . Alcohol Use: Yes     Comment: pt states she quit marijuan in May 2013. Rare ETOH, + cigarettes.  She is enrolled in school   OB History   Grav Para Term Preterm Abortions TAB SAB Ect Mult Living   2    1  1         Obstetric Comments   Miscarried in October 2012 at about 7 weeks     Review of Systems  Constitutional: Positive for chills. Negative for fever.  HENT: Negative for congestion.   Respiratory: Negative for cough, shortness of breath and wheezing.   Cardiovascular: Negative for chest  pain.  Gastrointestinal: Positive for nausea and vomiting.  Neurological: Negative for headaches.  All other systems reviewed and are negative.    Allergies  Carrot oil and Latex  Home Medications   Current Outpatient Rx  Name  Route  Sig  Dispense  Refill  . folic acid (FOLVITE) 800 MCG tablet   Oral   Take 800 mcg by mouth every morning.         . hydroxyurea (HYDREA) 500 MG capsule   Oral   Take 2 capsules (1,000 mg total) by mouth at bedtime. May take with food to minimize GI side effects.   60 capsule   3   . oxyCODONE (ROXICODONE) 15 MG immediate release tablet      15 mg every 12 hrs   60 tablet   0   . oxyCODONE (ROXICODONE) 5 MG immediate release tablet   Oral   Take 1 tablet (5 mg total) by mouth 4 (four) times daily as  needed for pain. May take 1 tablet by mouth every 6 hours as needed for PAIN   60 tablet   0   . polyethylene glycol (MIRALAX / GLYCOLAX) packet   Oral   Take 17 g by mouth daily as needed (for constipation).         . Prenatal Vit-Fe Fumarate-FA (PRENATAL MULTIVITAMIN) TABS tablet   Oral   Take 1 tablet by mouth daily at 12 noon.         . sertraline (ZOLOFT) 50 MG tablet   Oral   Take 1 tablet (50 mg total) by mouth daily.   30 tablet   3    BP 95/51  Pulse 91  Temp(Src) 99.2 F (37.3 C) (Oral)  Resp 18  Ht 5\' 2"  (1.575 m)  Wt 180 lb (81.647 kg)  BMI 32.91 kg/m2  SpO2 97%  LMP 01/17/2013 Physical Exam  Nursing note and vitals reviewed. Constitutional: She is oriented to person, place, and time. She appears well-developed and well-nourished. She appears distressed.  HENT:  Head: Normocephalic and atraumatic.  Mouth/Throat: Oropharynx is clear and moist.  Eyes: Conjunctivae and EOM are normal. Pupils are equal, round, and reactive to light.  Neck: Normal range of motion. Neck supple.  Cardiovascular: Normal rate, regular rhythm and intact distal pulses.   No murmur heard. Pulmonary/Chest: Effort normal and breath sounds normal. No respiratory distress. She has no wheezes. She has no rales.  Abdominal: Soft. She exhibits no distension. There is no tenderness. There is no rebound and no guarding.  Musculoskeletal: Normal range of motion. She exhibits tenderness. She exhibits no edema.  Bilateral thigh tenderness and right thoracic back pain  Neurological: She is alert and oriented to person, place, and time.  Skin: Skin is warm and dry. No rash noted. No erythema.  Psychiatric: She has a normal mood and affect. Her behavior is normal.    ED Course  Procedures (including critical care time) Labs Review Labs Reviewed  CBC WITH DIFFERENTIAL  RETICULOCYTES   Imaging Review No results found.  MDM  No diagnosis found.  Patient presenting with sickle cell pain  crisis with bilateral thigh and back pain that started yesterday. Patient states that she had some nausea and vomiting earlier today that's resolved and denies any abdominal pain now but thinks the dehydration worsened her pain. Also she takes oxycodone at home and states she ran out today taking her last 2 and she has a refill but did not go to pick it up.  Because the pain was not getting better she came here for further care. Patient has a low-grade temperature of 99 but otherwise stable blood pressure and pulse for her. She is satting normally and denies any chest symptoms. Her last hospitalization was last week where she received PRBCs.  Patient otherwise is stable and we'll attempt pain control and discharged home.  12:01 AM Pt still having pain and given second round of dilaudid.  Gwyneth Sprout, MD 02/11/13 0002

## 2013-02-11 ENCOUNTER — Inpatient Hospital Stay (HOSPITAL_COMMUNITY): Payer: Medicare Other

## 2013-02-11 ENCOUNTER — Encounter (HOSPITAL_COMMUNITY): Payer: Self-pay | Admitting: Internal Medicine

## 2013-02-11 DIAGNOSIS — D5700 Hb-SS disease with crisis, unspecified: Principal | ICD-10-CM

## 2013-02-11 DIAGNOSIS — D72829 Elevated white blood cell count, unspecified: Secondary | ICD-10-CM

## 2013-02-11 DIAGNOSIS — E43 Unspecified severe protein-calorie malnutrition: Secondary | ICD-10-CM | POA: Insufficient documentation

## 2013-02-11 LAB — CBC WITH DIFFERENTIAL/PLATELET
Basophils Absolute: 0.1 10*3/uL (ref 0.0–0.1)
Basophils Relative: 1 % (ref 0–1)
Basophils Relative: 1 % (ref 0–1)
Eosinophils Absolute: 0.3 10*3/uL (ref 0.0–0.7)
Eosinophils Relative: 1 % (ref 0–5)
HCT: 26.5 % — ABNORMAL LOW (ref 36.0–46.0)
Hemoglobin: 9.1 g/dL — ABNORMAL LOW (ref 12.0–15.0)
Lymphs Abs: 3.3 10*3/uL (ref 0.7–4.0)
Lymphs Abs: 3.5 10*3/uL (ref 0.7–4.0)
MCH: 31.8 pg (ref 26.0–34.0)
MCH: 32 pg (ref 26.0–34.0)
MCHC: 34.5 g/dL (ref 30.0–36.0)
Monocytes Absolute: 2 10*3/uL — ABNORMAL HIGH (ref 0.1–1.0)
Neutrophils Relative %: 60 % (ref 43–77)
Neutrophils Relative %: 65 % (ref 43–77)
Platelets: 468 10*3/uL — ABNORMAL HIGH (ref 150–400)
RBC: 2.84 MIL/uL — ABNORMAL LOW (ref 3.87–5.11)
RDW: 16.7 % — ABNORMAL HIGH (ref 11.5–15.5)
WBC: 14.5 10*3/uL — ABNORMAL HIGH (ref 4.0–10.5)

## 2013-02-11 LAB — POCT I-STAT, CHEM 8
Calcium, Ion: 1.12 mmol/L (ref 1.12–1.23)
Glucose, Bld: 89 mg/dL (ref 70–99)
HCT: 31 % — ABNORMAL LOW (ref 36.0–46.0)
Hemoglobin: 10.5 g/dL — ABNORMAL LOW (ref 12.0–15.0)
Potassium: 4.2 mEq/L (ref 3.5–5.1)

## 2013-02-11 LAB — URINALYSIS, ROUTINE W REFLEX MICROSCOPIC
Bilirubin Urine: NEGATIVE
Ketones, ur: NEGATIVE mg/dL
Nitrite: NEGATIVE
Urobilinogen, UA: 1 mg/dL (ref 0.0–1.0)

## 2013-02-11 LAB — RETICULOCYTES
RBC.: 2.84 MIL/uL — ABNORMAL LOW (ref 3.87–5.11)
Retic Ct Pct: 15.4 % — ABNORMAL HIGH (ref 0.4–3.1)

## 2013-02-11 LAB — BASIC METABOLIC PANEL
BUN: 6 mg/dL (ref 6–23)
Calcium: 8.8 mg/dL (ref 8.4–10.5)
Creatinine, Ser: 0.59 mg/dL (ref 0.50–1.10)
GFR calc non Af Amer: >90 mL/min (ref >90)
Glucose, Bld: 92 mg/dL (ref 70–99)

## 2013-02-11 MED ORDER — ACETAMINOPHEN 650 MG RE SUPP: 650.0000 mg | Freq: Four times a day (QID) | RECTAL | Status: DC | PRN

## 2013-02-11 MED ORDER — FOLIC ACID 1 MG PO TABS
1.0000 mg | ORAL_TABLET | Freq: Every morning | ORAL | Status: DC
  Administered 2013-02-11 – 2013-02-14 (×4): 1 mg via ORAL
  Filled 2013-02-11 (×4): qty 1

## 2013-02-11 MED ORDER — SODIUM CHLORIDE 0.9 % IV SOLN
Freq: Once | INTRAVENOUS | Status: AC
Start: 2013-02-11 — End: 2013-02-11
  Administered 2013-02-11: 07:00:00 via INTRAVENOUS

## 2013-02-11 MED ORDER — HYDROMORPHONE HCL PF 2 MG/ML IJ SOLN
2.0000 mg | Freq: Once | INTRAMUSCULAR | Status: AC
  Administered 2013-02-11: 2 mg via INTRAVENOUS
  Filled 2013-02-11: qty 1

## 2013-02-11 MED ORDER — ACETAMINOPHEN 325 MG PO TABS
650.0000 mg | ORAL_TABLET | Freq: Four times a day (QID) | ORAL | Status: DC | PRN
  Administered 2013-02-12 (×2): 650 mg via ORAL
  Filled 2013-02-11 (×2): qty 2

## 2013-02-11 MED ORDER — SERTRALINE HCL 50 MG PO TABS
50.0000 mg | ORAL_TABLET | Freq: Every day | ORAL | Status: DC
  Administered 2013-02-11 – 2013-02-14 (×4): 50 mg via ORAL
  Filled 2013-02-11 (×4): qty 1

## 2013-02-11 MED ORDER — OXYCODONE HCL 5 MG PO TABS
5.0000 mg | ORAL_TABLET | Freq: Four times a day (QID) | ORAL | Status: DC | PRN
  Administered 2013-02-11: 5 mg via ORAL
  Filled 2013-02-11: qty 1

## 2013-02-11 MED ORDER — HYDROMORPHONE 0.3 MG/ML IV SOLN
INTRAVENOUS | Status: DC
  Administered 2013-02-11 (×2): via INTRAVENOUS
  Administered 2013-02-12: 1.59 mg via INTRAVENOUS
  Administered 2013-02-12: 3.99 mg via INTRAVENOUS
  Administered 2013-02-12: 0.79 mg via INTRAVENOUS
  Administered 2013-02-12: 11:00:00 via INTRAVENOUS
  Administered 2013-02-12: 6.19 mg via INTRAVENOUS
  Administered 2013-02-12: 0.99 mg via INTRAVENOUS
  Filled 2013-02-11 (×3): qty 25

## 2013-02-11 MED ORDER — OXYCODONE HCL 5 MG PO TABS
15.0000 mg | ORAL_TABLET | Freq: Two times a day (BID) | ORAL | Status: DC
  Administered 2013-02-11 (×2): 15 mg via ORAL
  Filled 2013-02-11 (×2): qty 3

## 2013-02-11 MED ORDER — POLYETHYLENE GLYCOL 3350 17 G PO PACK
17.0000 g | PACK | Freq: Every day | ORAL | Status: DC | PRN
  Filled 2013-02-11: qty 1

## 2013-02-11 MED ORDER — ONDANSETRON HCL 4 MG PO TABS: 4.0000 mg | ORAL_TABLET | Freq: Four times a day (QID) | ORAL | Status: DC | PRN

## 2013-02-11 MED ORDER — HYDROMORPHONE HCL PF 1 MG/ML IJ SOLN
1.0000 mg | Freq: Once | INTRAMUSCULAR | Status: AC
  Administered 2013-02-11: 1 mg via INTRAVENOUS
  Filled 2013-02-11: qty 1

## 2013-02-11 MED ORDER — ONDANSETRON HCL 4 MG/2ML IJ SOLN: 4.0000 mg | Freq: Four times a day (QID) | INTRAMUSCULAR | Status: DC | PRN

## 2013-02-11 MED ORDER — ONDANSETRON HCL 4 MG/2ML IJ SOLN
4.0000 mg | Freq: Once | INTRAMUSCULAR | Status: AC
  Administered 2013-02-11: 4 mg via INTRAVENOUS
  Filled 2013-02-11: qty 2

## 2013-02-11 MED ORDER — HYDROMORPHONE HCL PF 2 MG/ML IJ SOLN
2.0000 mg | INTRAMUSCULAR | Status: DC
  Administered 2013-02-11: 2 mg via INTRAVENOUS
  Filled 2013-02-11: qty 1

## 2013-02-11 MED ORDER — HYDROXYUREA 500 MG PO CAPS
1000.0000 mg | ORAL_CAPSULE | Freq: Every day | ORAL | Status: DC
  Administered 2013-02-11 – 2013-02-13 (×3): 1000 mg via ORAL
  Filled 2013-02-11 (×4): qty 2

## 2013-02-11 MED ORDER — ENSURE COMPLETE PO LIQD
237.0000 mL | Freq: Two times a day (BID) | ORAL | Status: DC
  Administered 2013-02-12 – 2013-02-13 (×4): 237 mL via ORAL

## 2013-02-11 MED ORDER — PRENATAL MULTIVITAMIN CH
1.0000 | ORAL_TABLET | Freq: Every day | ORAL | Status: DC
  Administered 2013-02-11 – 2013-02-14 (×4): 1 via ORAL
  Filled 2013-02-11 (×5): qty 1

## 2013-02-11 MED ORDER — SODIUM CHLORIDE 0.45 % IV SOLN
INTRAVENOUS | Status: AC
  Administered 2013-02-11 (×2): via INTRAVENOUS

## 2013-02-11 MED ORDER — ENOXAPARIN SODIUM 40 MG/0.4ML ~~LOC~~ SOLN
40.0000 mg | SUBCUTANEOUS | Status: DC
  Administered 2013-02-11 – 2013-02-13 (×3): 40 mg via SUBCUTANEOUS
  Filled 2013-02-11 (×4): qty 0.4

## 2013-02-11 NOTE — H&P (Addendum)
[facility] History and Physical  [name] WUJ:[id number] DOB: 1991/02/06 DOA: 02/10/2013  Referring physician: ER physician. PCP: MATTHEWS,MICHELLE A., MD   Chief Complaint: Low back pain.  HPI: [name] is a 22 y.o. female with known history of sickle cell disease presented to the ER because of low back pain. Patient states over the last 2 days patient has been having low back pain and thigh pain. Patient states the pain is typical of her sickle cell pain crisis. Patient denies any chest pain focal deficits headache fever chills productive cough shortness of breath. In the ER despite giving multiple does of pain relief medications patient still has pain and has been admitted for sickle cell pain crisis.  Review of Systems: As presented in the history of presenting illness, rest negative.  Past Medical History  Diagnosis Date  . H/O: 1 miscarriage 03/22/2011  . 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 Depression 01/06/2011  . GERD (gastroesophageal reflux disease) 02/17/2011  . Trichotillomania     h/o  . Blood transfusion     "lots"  . Sickle cell anemia with crisis   . Exertional dyspnea     "sometimes"  . Sickle cell anemia   . Headache(784.0)   . Migraines 11/08/11    "@ least twice/month"  . Chronic back pain     "very severe; have knot in my back; from tight muscle; take RX and exercise for it"  . Mood swings 11/08/11    "I go back and forth; real bad"  . Pregnancy   . Blood dyscrasia     SICKLE CELL   Past Surgical History  Procedure Laterality Date  . Cholecystectomy  05/2010  . Dilation and curettage of uterus  02/20/11    S/P miscarriage   Social History:  reports that she has been smoking Cigarettes.  She has a .25 pack-year smoking history. She has never used smokeless tobacco. She reports that  drinks alcohol. She reports that she does not use illicit drugs. Home. where does patient live-- Can do ADLs. Can patient participate in ADLs?  Allergies   Allergen Reactions  . Carrot Oil Hives and Swelling  . Latex Rash    Family History  Problem Relation Age of Onset  . Diabetes Mother   . Alcoholism    . Depression    . Hypercholesterolemia    . Hypertension    . Migraines    . Diabetes Maternal Grandmother   . Diabetes Paternal Grandmother       Prior to Admission medications   Medication Sig Start Date End Date Taking? Authorizing Provider  folic acid (FOLVITE) 800 MCG tablet Take 800 mcg by mouth every morning.   Yes Historical Provider, MD  hydroxyurea (HYDREA) 500 MG capsule Take 2 capsules (1,000 mg total) by mouth at bedtime. May take with food to minimize GI side effects. 01/18/13  Yes Grayce Sessions, NP  oxyCODONE (ROXICODONE) 15 MG immediate release tablet 15 mg every 12 hrs 01/30/13  Yes Meredeth Ide, MD  oxyCODONE (ROXICODONE) 5 MG immediate release tablet Take 1 tablet (5 mg total) by mouth 4 (four) times daily as needed for pain. May take 1 tablet by mouth every 6 hours as needed for PAIN 01/30/13 01/30/14 Yes Meredeth Ide, MD  polyethylene glycol (MIRALAX / GLYCOLAX) packet Take 17 g by mouth daily as needed (for constipation).   Yes Historical Provider, MD  Prenatal Vit-Fe Fumarate-FA (PRENATAL MULTIVITAMIN) TABS tablet Take 1 tablet by mouth daily at  12 noon.   Yes Historical Provider, MD  sertraline (ZOLOFT) 50 MG tablet Take 1 tablet (50 mg total) by mouth daily. 10/15/12  Yes Altha Harm, MD   Physical Exam: Filed Vitals:   02/10/13 2153 02/11/13 0228 02/11/13 0536 02/11/13 0600  BP: 95/51 105/55 101/71 109/60  Pulse: 91  78 89  Temp: 99.2 F (37.3 C)     TempSrc: Oral     Resp: 18  15 18   Height: 5\' 2"  (1.575 m)     Weight: 81.647 kg (180 lb)     SpO2: 97%  100% 99%     General:  Well-developed and nourished.  Eyes: Anicteric no pallor.  ENT: No discharge from ears eyes nose mouth.  Neck: No mass felt.  Cardiovascular: S1-S2 heard.  Respiratory: No rhonchi or crepitations.  Abdomen:  Soft nontender bowel sounds present.  Skin: No rash.  Musculoskeletal: No edema.  Psychiatric: Appears normal.  Neurologic: Alert awake oriented to time place and person. Moves all extremities.  Labs on Admission:  Basic Metabolic Panel:  Recent Labs Lab 02/11/13 0002  NA 142  K 4.2  CL 107  GLUCOSE 89  BUN 5*  CREATININE 0.70   Liver Function Tests: No results found for this basename: AST, ALT, ALKPHOS, BILITOT, PROT, ALBUMIN,  in the last 168 hours No results found for this basename: LIPASE, AMYLASE,  in the last 168 hours No results found for this basename: AMMONIA,  in the last 168 hours CBC:  Recent Labs Lab 02/10/13 2354 02/11/13 0002  WBC 16.8*  --   NEUTROABS 10.9*  --   HGB 9.1* 10.5*  HCT 26.5* 31.0*  MCV 93.3  --   PLT 452*  --    Cardiac Enzymes: No results found for this basename: CKTOTAL, CKMB, CKMBINDEX, TROPONINI,  in the last 168 hours  BNP (last 3 results) No results found for this basename: PROBNP,  in the last 8760 hours CBG: No results found for this basename: GLUCAP,  in the last 168 hours  Radiological Exams on Admission: No results found.   Assessment/Plan Principal Problem:   Vaso-occlusive sickle cell crisis Active Problems:   Leukocytosis, unspecified   1. Sickle cell pain crisis - have placed patient on scheduled dose of Dilaudid 2 mg every 2 hourly along with home medications. Gently hydrate. 2. Leukocytosis - patient has mild fever but patient is not hypoxic and does not have any productive cough or shortness of breath or any chest pain. Denies any vomiting or diarrhea or abdominal pain. At this time I have ordered chest x-ray and UA chest be followed and has not place patient on any empiric antibiotics for now. Patient was recently admitted for pharyngitis last month but presently denies any throat pain.    Code Status: Full code.  Family Communication: None.  Disposition Plan: Admit to inpatient under sickle cell team.     Eduard Clos. [facility] Pager [phone].  If 7PM-7AM, please contact night-coverage [email or web address] Password [facility] 02/11/2013, 6:34 AM       Patient is admitted this am with sickle cell painful crisis. She is now on dilaudid 2mg  Q 4 hours. We will increase it to 4mg  if she is not getting full relief. Patient verbalize that she has more social issues than her pain. She wants to talk to the Child psychotherapist. Will also change to Dilaudid PCA.

## 2013-02-11 NOTE — ED Notes (Signed)
Attempted to call report. Stated that they are still in report

## 2013-02-11 NOTE — ED Provider Notes (Signed)
Pt with h/o sickle cell anemia, here with pain.  Has received 2nd dose of dilaudid.  She is wanting to be d/c home.  Labs pending.    4:45 AM Pt reports persistent pain.  Will give one final dose to see if we can get her under control, but feel she may require admission.  Results for orders placed during the hospital encounter of 02/10/13  CBC WITH DIFFERENTIAL      Result Value Range   WBC 16.8 (*) 4.0 - 10.5 K/uL   RBC 2.84 (*) 3.87 - 5.11 MIL/uL   Hemoglobin 9.1 (*) 12.0 - 15.0 g/dL   HCT 13.0 (*) 86.5 - 78.4 %   MCV 93.3  78.0 - 100.0 fL   MCH 32.0  26.0 - 34.0 pg   MCHC 34.3  30.0 - 36.0 g/dL   RDW 69.6 (*) 29.5 - 28.4 %   Platelets 452 (*) 150 - 400 K/uL   Neutrophils Relative % 65  43 - 77 %   Lymphocytes Relative 21  12 - 46 %   Monocytes Relative 12  3 - 12 %   Eosinophils Relative 1  0 - 5 %   Basophils Relative 1  0 - 1 %   nRBC 2 (*) 0 /100 WBC   Neutro Abs 10.9 (*) 1.7 - 7.7 K/uL   Lymphs Abs 3.5  0.7 - 4.0 K/uL   Monocytes Absolute 2.0 (*) 0.1 - 1.0 K/uL   Eosinophils Absolute 0.2  0.0 - 0.7 K/uL   Basophils Absolute 0.2 (*) 0.0 - 0.1 K/uL   RBC Morphology TARGET CELLS     WBC Morphology WHITE COUNT CONFIRMED ON SMEAR     Smear Review PLATELET COUNT CONFIRMED BY SMEAR    RETICULOCYTES      Result Value Range   Retic Ct Pct 15.4 (*) 0.4 - 3.1 %   RBC. 2.84 (*) 3.87 - 5.11 MIL/uL   Retic Count, Manual 437.4 (*) 19.0 - 186.0 K/uL  POCT I-STAT, CHEM 8      Result Value Range   Sodium 142  135 - 145 mEq/L   Potassium 4.2  3.5 - 5.1 mEq/L   Chloride 107  96 - 112 mEq/L   BUN 5 (*) 6 - 23 mg/dL   Creatinine, Ser 1.32  0.50 - 1.10 mg/dL   Glucose, Bld 89  70 - 99 mg/dL   Calcium, Ion 4.40  1.02 - 1.23 mmol/L   TCO2 25  0 - 100 mmol/L   Hemoglobin 10.5 (*) 12.0 - 15.0 g/dL   HCT 72.5 (*) 36.6 - 44.0 %   Dg Chest 2 View  01/27/2013   *RADIOLOGY REPORT*  Clinical Data: Sickle cell crisis, chest pain  CHEST - 2 VIEW  Comparison: 01/01/2013  Findings: Heart size and  vascular pattern are normal.  Mild right upper lobe atelectasis.  Lungs otherwise clear.  No pleural effusions.  Scoliosis stable.  IMPRESSION: Mild hypoventilatory right upper lobe change.   Original Report Authenticated By: Esperanza Heir, M.D.      Olivia Mackie, MD 02/11/13 [phone]

## 2013-02-11 NOTE — Progress Notes (Signed)
INITIAL NUTRITION ASSESSMENT  Pt meets criteria for severe MALNUTRITION in the context of social/environmental circumstances as evidenced by <50% estimated energy intake with 5.3% weight loss in the past 2 weeks per pt report/weight trend.  DOCUMENTATION CODES Per approved criteria  -Severe  malnutrition in the context of social or environmental circumstances -Obesity Unspecified   INTERVENTION: - Ensure Complete BID - Educated pt on healthy eating tips and encouraged pt to get on regular meal schedule to promote healthy eating and appetite. - Encouraged pt to find ways to manage her stress. Pt identified one coping mechanism is talking to friends.  - Assisted pt with ordering breakfast - Will continue to monitor   NUTRITION DIAGNOSIS: Unintended weight loss related to poor appetite/intake/stress as evidenced by pt report, weight trend.   Goal: Pt to consume >90% of meals/supplements  Monitor:  Weights, labs, intake  Reason for Assessment: Nutrition risk   22 y.o. female  Admitting Dx: Vaso-occlusive sickle cell crisis  ASSESSMENT: Pt discussed during multidisciplinary rounds.   Pt known to RD from admission earlier this month. Admitted with low back pain, found to have sickle cell pain crisis. Pt's weight down 10 pounds unintentionally in the past 2 weeks. Pt reports this has been due to stress and pt eating only 1 meal/day. Pt reports she is moving into her own place soon so she wanted ideas on how to eat healthy. Healthy grocery shopping tips discussed. Pt interested in getting Ensure Complete during this admission.   Height: Ht Readings from Last 1 Encounters:  02/10/13 5\' 2"  (1.575 m)    Weight: Wt Readings from Last 1 Encounters:  02/10/13 180 lb (81.647 kg)    Ideal Body Weight: 110 lb   % Ideal Body Weight: 164%  Wt Readings from Last 10 Encounters:  02/10/13 180 lb (81.647 kg)  01/29/13 190 lb 3 oz (86.268 kg)  01/06/13 194 lb 14.2 oz (88.4 kg)  12/04/12  192 lb (87.091 kg)  11/20/12 185 lb (83.915 kg)  11/05/12 192 lb (87.091 kg)  10/29/12 191 lb (86.637 kg)  10/15/12 190 lb (86.183 kg)  10/12/12 188 lb 11.4 oz (85.6 kg)  08/31/12 185 lb 6.4 oz (84.097 kg)    Usual Body Weight: 190 lb 2 weeks ago  % Usual Body Weight: 95%  BMI:  Body mass index is 32.91 kg/(m^2). Class I obesity  Estimated Nutritional Needs: Kcal: 1500-1650 Protein: 50-60g Fluid: 1.5-1.6L/day  Skin: Intact   Diet Order: General  EDUCATION NEEDS: -Education needs addressed - discussed importance of following a healthy diet for overall health. Provided handouts on healthy eating, snacks, grocery shopping, and meal plans.    No intake or output data in the 24 hours ending 02/11/13 1040  Last BM: PTA  Labs:   Recent Labs Lab 02/11/13 0002 02/11/13 0910  NA 142 137  K 4.2 3.5  CL 107 103  CO2  --  24  BUN 5* 6  CREATININE 0.70 0.59  CALCIUM  --  8.8  GLUCOSE 89 92    CBG (last 3)  No results found for this basename: GLUCAP,  in the last 72 hours  Scheduled Meds: . enoxaparin (LOVENOX) injection  40 mg Subcutaneous Q24H  . folic acid  1 mg Oral q morning - 10a  .  HYDROmorphone (DILAUDID) injection  2 mg Intravenous Q2H  . hydroxyurea  1,000 mg Oral QHS  . oxyCODONE  15 mg Oral Every 12 hours (non-specified)  . prenatal multivitamin  1 tablet Oral  Q1200  . sertraline  50 mg Oral Daily    Continuous Infusions: . sodium chloride 100 mL/hr at 02/11/13 1025    Past Medical History  Diagnosis Date  . H/O: 1 miscarriage 03/22/2011  . 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 Depression 01/06/2011  . GERD (gastroesophageal reflux disease) 02/17/2011  . Trichotillomania     h/o  . Blood transfusion     "lots"  . Sickle cell anemia with crisis   . Exertional dyspnea     "sometimes"  . Sickle cell anemia   . Headache(784.0)   . Migraines 11/08/11    "@ least twice/month"  . Chronic back pain     "very severe; have knot in my back; from tight  muscle; take RX and exercise for it"  . Mood swings 11/08/11    "I go back and forth; real bad"  . Pregnancy   . Blood dyscrasia     SICKLE CELL    Past Surgical History  Procedure Laterality Date  . Cholecystectomy  05/2010  . Dilation and curettage of uterus  02/20/11    S/P miscarriage    Levon Hedger MS, RD, LDN [phone] Pager [phone] After Hours Pager

## 2013-02-11 NOTE — Care Management (Signed)
[name] DOB :1990-09-16 MRN :[id number]  Date: 02/11/2013  Documentation Initiated by : Jefm Miles  Subjective/Objective Assessment: Ms.Punch is a 22 year old female with known SCD, currently inpatient [name] states she came in due to back pain. This is CM continue to encourage [name] to schedule counselor appointment with [facility]. This CM advised [name] to schedule her appointment and we can provide bus pass so that she can make appointment. Also, this CM will provide information for home counseling since at this time transportation is an issue. [name] states her car is in the shop and she doesn't have all the money to get it fixed.[name] stated she needs financial assistance with her $200 deposit for her apartment. [name] is interested in seeing a chiropractic, this CM encouraged [name] to speak with Dr. Ashley Royalty about the best option for her as it relates a chiropractic. This CM advised that Medicaid may not pay for that service. [name] expressed concerns about obtaining employment.  [name] shared that her stressors are related to financial, and dealing with her chronic disease on a daily basis. This CM processed with [name] her options and her abilities. This CM again encouraged the importance of including a mental health counselor in healthcare plan. [name] stated" I feel like I've let you down because I haven't seen a counselor yet, but I do plan on going."  This continued to encourage [name].  Barriers to Care: Transportation and needs to followup with mental health counselor   Prior Approval (PA) #: na PA start date:na PA end date:  na  Action/Plan: This CM will continue to monitor and encourage [name] to follow up with plans to see a counselor. This CM will provide [name] with another counselor that provides home counseling.  Comments: none Time spent: 60 mins Karoline Caldwell, RN, BSN, [place]      [phone]

## 2013-02-12 DIAGNOSIS — F329 Major depressive disorder, single episode, unspecified: Secondary | ICD-10-CM

## 2013-02-12 LAB — COMPREHENSIVE METABOLIC PANEL
ALT: 13 U/L (ref 0–35)
AST: 19 U/L (ref 0–37)
Albumin: 3.5 g/dL (ref 3.5–5.2)
BUN: 6 mg/dL (ref 6–23)
CO2: 25 mEq/L (ref 19–32)
Chloride: 103 mEq/L (ref 96–112)
Creatinine, Ser: 0.68 mg/dL (ref 0.50–1.10)
Potassium: 3.7 mEq/L (ref 3.5–5.1)
Sodium: 139 mEq/L (ref 135–145)
Total Bilirubin: 1.6 mg/dL — ABNORMAL HIGH (ref 0.3–1.2)
Total Protein: 7 g/dL (ref 6.0–8.3)

## 2013-02-12 LAB — DIFFERENTIAL
Basophils Relative: 2 % — ABNORMAL HIGH (ref 0–1)
Eosinophils Absolute: 0.6 10*3/uL (ref 0.0–0.7)
Eosinophils Relative: 4 % (ref 0–5)
Lymphocytes Relative: 38 % (ref 12–46)
Neutro Abs: 6.3 10*3/uL (ref 1.7–7.7)
Neutrophils Relative %: 43 % (ref 43–77)

## 2013-02-12 LAB — CBC
MCHC: 35.1 g/dL (ref 30.0–36.0)
MCV: 92.6 fL (ref 78.0–100.0)
Platelets: 403 10*3/uL — ABNORMAL HIGH (ref 150–400)
RBC: 2.83 MIL/uL — ABNORMAL LOW (ref 3.87–5.11)
RDW: 16.6 % — ABNORMAL HIGH (ref 11.5–15.5)
WBC: 14.7 10*3/uL — ABNORMAL HIGH (ref 4.0–10.5)

## 2013-02-12 MED ORDER — KETOROLAC TROMETHAMINE 30 MG/ML IJ SOLN
30.0000 mg | Freq: Four times a day (QID) | INTRAMUSCULAR | Status: DC
  Administered 2013-02-12 – 2013-02-14 (×9): 30 mg via INTRAVENOUS
  Filled 2013-02-12 (×15): qty 1

## 2013-02-12 MED ORDER — SENNOSIDES-DOCUSATE SODIUM 8.6-50 MG PO TABS
1.0000 | ORAL_TABLET | Freq: Two times a day (BID) | ORAL | Status: DC
  Administered 2013-02-12 – 2013-02-14 (×4): 1 via ORAL
  Filled 2013-02-12 (×6): qty 1

## 2013-02-12 MED ORDER — HYDROMORPHONE 0.3 MG/ML IV SOLN
INTRAVENOUS | Status: DC
  Administered 2013-02-12: 3.12 mg via INTRAVENOUS
  Administered 2013-02-13: 5.46 mg via INTRAVENOUS
  Administered 2013-02-13: 5.77 mg via INTRAVENOUS
  Administered 2013-02-13: 2.66 mg via INTRAVENOUS
  Administered 2013-02-13: 17:00:00 via INTRAVENOUS
  Administered 2013-02-13: 5.66 mg via INTRAVENOUS
  Administered 2013-02-13: 4.9 mg via INTRAVENOUS
  Administered 2013-02-13: 10:00:00 via INTRAVENOUS
  Administered 2013-02-13: 4.09 mg via INTRAVENOUS
  Administered 2013-02-14: 2.49 mg via INTRAVENOUS
  Administered 2013-02-14 (×2): via INTRAVENOUS
  Filled 2013-02-12 (×6): qty 25

## 2013-02-12 NOTE — Progress Notes (Signed)
SICKLE CELL SERVICE PROGRESS NOTE  [name] ZOX:[id number] DOB: [birth year] DOA: 02/10/2013 PCP: MATTHEWS,MICHELLE A., MD  Assessment/Plan: Principal Problem:   Vaso-occlusive sickle cell crisis Active Problems:   Leukocytosis, unspecified   Protein-calorie malnutrition, severe  1. Hb SS: Pt states that her pain has been 7/10 since yesterday and has not changed. However patient used a total of 10.36 mg of Dilaudid since yesterday. She states that she has been sleeping a lot.  Will add Toradol and evaluate PCA use and pain response in the next several hours and adjust PCA as necessary.  Pt missed her last appointment because she did not have transportation or phone. This is unusual as patient is usually very compliant with her appointments. Pt states that pain began 4 days ago and she increase her fluids, used a heating pad and took her oxycodone as prescribed 15 mg-10mg -15 mg. She is not on any long acting medications and states that her pain is usually in the morning 2. Constipation: pt has a small BM this morning. Will add senakot-S. 3. Depression: Pt on Zoloft. Feels that it is working.  Code Status: Full Code  Family Communication: N/A Disposition Plan: Home in 20-48 hours  MATTHEWS,MICHELLE A.  Pager [phone]. If 7PM-7AM, please contact night-coverage.  02/12/2013, 9:37 AM  LOS: 2 days   Brief narrative: [name] is a 22 y.o. female with known history of sickle cell disease presented to the ER because of low back pain. Patient states over the last 2 days patient has been having low back pain and thigh pain. Patient states the pain is typical of her sickle cell pain crisis. Patient denies any chest pain focal deficits headache fever chills productive cough shortness of breath. In the ER despite giving multiple does of pain relief medications patient still has pain and has been admitted for sickle cell pain  crisis   Consultants:  None  Procedures:  None  Antibiotics:  None  HPI/Subjective: Pt states that her pain has been 7/10 since yesterday and has not changed. Pt had a small BM today.  Objective: Filed Vitals:   02/11/13 1631 02/11/13 1821 02/11/13 2141 02/12/13 0430  BP:  106/40 107/55 113/61  Pulse:  105 98 82  Temp:  97.6 F (36.4 C) 98.3 F (36.8 C) 98.1 F (36.7 C)  TempSrc:  Oral Oral Oral  Resp: 14 16 16 16   Height:      Weight:    192 lb 10.9 oz (87.4 kg)  SpO2:  99% 99% 96%   Weight change: 12 lb 10.9 oz (5.752 kg)  Intake/Output Summary (Last 24 hours) at 02/12/13 0937 Last data filed at 02/12/13 0649  Gross per 24 hour  Intake   1200 ml  Output   3150 ml  Net  -1950 ml    General: Alert, awake, oriented x3, in no acute distress.  HEENT: [place]/AT PEERL, EOMI, Anicteric Heart: Regular rate and rhythm, without murmurs, rubs, gallops, PMI non-displaced, no heaves or thrills on palpation.  Lungs: Clear to auscultation, no wheezing or rhonchi noted. No increased vocal fremitus resonant to percussion  Abdomen: Soft, nontender, nondistended, positive bowel sounds, no masses no hepatosplenomegaly noted.  Neuro: No focal neurological deficits noted cranial nerves II through XII grossly intact. DTRs 2+ bilaterally upper and lower extremities. Strength normal in bilateral upper and lower extremities. Musculoskeletal: No warm swelling or erythema around joints. Psychiatric: Patient alert and oriented x3, good insight and cognition, good recent to remote recall.  Data Reviewed: Basic Metabolic Panel:  Recent Labs Lab 02/11/13 0002 02/11/13 0910 02/12/13 0400  NA 142 137 139  K 4.2 3.5 3.7  CL 107 103 103  CO2  --  24 25  GLUCOSE 89 92 99  BUN 5* 6 6  CREATININE 0.70 0.59 0.68  CALCIUM  --  8.8 9.0   Liver Function Tests:  Recent Labs Lab 02/12/13 0400  AST 19  ALT 13  ALKPHOS 65  BILITOT 1.6*  PROT 7.0  ALBUMIN 3.5   No results found for  this basename: LIPASE, AMYLASE,  in the last 168 hours No results found for this basename: AMMONIA,  in the last 168 hours CBC:  Recent Labs Lab 02/10/13 2354 02/11/13 0002 02/11/13 0910 02/12/13 0400  WBC 16.8*  --  14.5* 14.7*  NEUTROABS 10.9*  --  8.8*  --   HGB 9.1* 10.5* 9.0* 9.2*  HCT 26.5* 31.0* 26.1* 26.2*  MCV 93.3  --  92.2 92.6  PLT 452*  --  468* 403*     Studies: Dg Chest 2 View  02/11/2013   CLINICAL DATA:  Sickle cell crisis, cough, congestion.  EXAM: CHEST  2 VIEW  COMPARISON:  01/27/2013  FINDINGS: Heart and mediastinal contours are within normal limits. No focal opacities or effusions. No acute bony abnormality.  IMPRESSION: No active cardiopulmonary disease.   Electronically Signed   By: Charlett Nose M.D.   On: 02/11/2013 07:02   Dg Chest 2 View  01/27/2013   *RADIOLOGY REPORT*  Clinical Data: Sickle cell crisis, chest pain  CHEST - 2 VIEW  Comparison: 01/01/2013  Findings: Heart size and vascular pattern are normal.  Mild right upper lobe atelectasis.  Lungs otherwise clear.  No pleural effusions.  Scoliosis stable.  IMPRESSION: Mild hypoventilatory right upper lobe change.   Original Report Authenticated By: Esperanza Heir, M.D.    Scheduled Meds: . enoxaparin (LOVENOX) injection  40 mg Subcutaneous Q24H  . feeding supplement  237 mL Oral BID BM  . folic acid  1 mg Oral q morning - 10a  . HYDROmorphone PCA 0.3 mg/mL   Intravenous Q4H  . hydroxyurea  1,000 mg Oral QHS  . ketorolac  30 mg Intravenous Q6H  . prenatal multivitamin  1 tablet Oral Q1200  . senna-docusate  1 tablet Oral BID  . sertraline  50 mg Oral Daily   Total time 40 minutes.

## 2013-02-12 NOTE — Care Management Note (Signed)
   CARE MANAGEMENT NOTE 02/12/2013  Patient:  [name], [name]   Account Number:  [id number]  Date Initiated:  02/12/2013  Documentation initiated by:  Rheana Casebolt  Subjective/Objective Assessment:   22 yo female admitted with sickle cell crisis. PCP; Dr. Marlana Salvage     Action/Plan:   Home when stable   Anticipated DC Date:     Anticipated DC Plan:  HOME/SELF CARE      DC Planning Services  CM consult      Choice offered to / List presented to:  NA   DME arranged  NA      DME agency  NA     HH arranged  NA      HH agency  NA   Status of service:  Completed, signed off Medicare Important Message given?   (If response is "NO", the following Medicare IM given date fields will be blank) Date Medicare IM given:   Date Additional Medicare IM given:    Discharge Disposition:    Per UR Regulation:  Reviewed for med. necessity/level of care/duration of stay  If discussed at Long Length of Stay Meetings, dates discussed:    Comments:  02/12/13 1450 Kelcee Bjorn,RN,MSN [phone] Chart reviewed for utilization of services. Pt followed by Case Manager at [facility]. Pt encouraged to seek services at [facility] for social barriers. Cm recommendation for pt to follow up with [facility] and [facility] for community resources.

## 2013-02-12 NOTE — Progress Notes (Signed)
Patient ID: [name], female   DOB: [birth year], 22 y.o.   MRN: [id number]  In the last 12 hours pt's PCA use has been: a total of 7.59 mg out of an available 48 mg of dilaudid. 38/72 deliveries and 38 demands/attemopts with/38 deliveries. This suggests that pain is improved although patient reporting no improvement in pain. If pain improves overnight,  will likely be able to decrease tomorrow in anticipation of transitioning to oral medications. I will  add 0.2 mg /hr as a continuous basal dose to allow patient to sleep tonight and retain current parameters of 0.8 mg bolus with a 10 minute lockout.

## 2013-02-13 LAB — CBC WITH DIFFERENTIAL/PLATELET
Basophils Absolute: 0.1 10*3/uL (ref 0.0–0.1)
Basophils Relative: 1 % (ref 0–1)
Eosinophils Relative: 4 % (ref 0–5)
HCT: 27 % — ABNORMAL LOW (ref 36.0–46.0)
Hemoglobin: 9.4 g/dL — ABNORMAL LOW (ref 12.0–15.0)
Lymphocytes Relative: 34 % (ref 12–46)
Lymphs Abs: 4.8 10*3/uL — ABNORMAL HIGH (ref 0.7–4.0)
MCH: 32.3 pg (ref 26.0–34.0)
MCV: 92.8 fL (ref 78.0–100.0)
Monocytes Relative: 14 % — ABNORMAL HIGH (ref 3–12)
Neutro Abs: 6.5 10*3/uL (ref 1.7–7.7)
Platelets: 444 10*3/uL — ABNORMAL HIGH (ref 150–400)
RBC: 2.91 MIL/uL — ABNORMAL LOW (ref 3.87–5.11)
RDW: 16.6 % — ABNORMAL HIGH (ref 11.5–15.5)
WBC: 14 10*3/uL — ABNORMAL HIGH (ref 4.0–10.5)
nRBC: 2 /100 WBC — ABNORMAL HIGH

## 2013-02-13 NOTE — Progress Notes (Signed)
Subjective: A 22 yo admitted with sickle cell pain crisis. She is on Dilaudid PCA and is using it appropriately today. Her pain is at 6/10 and hope to come off the PCA. Denies SOB, No NVD. Has not walked around. Still stressed out due to financial reasons but has met with the social worker and has been feeling better. She likes the adjustments done to her PCA dosing yesterday.  Objective: Vital signs in last 24 hours: Temp:  [98 F (36.7 C)-99.1 F (37.3 C)] 98.8 F (37.1 C) (10/01 1838) Pulse Rate:  [72-95] 77 (10/01 1838) Resp:  [11-18] 14 (10/01 1838) BP: (102-139)/(40-64) 104/40 mmHg (10/01 1838) SpO2:  [93 %-100 %] 93 % (10/01 1838) FiO2 (%):  [99 %] 99 % (10/01 0358) Weight change:  Last BM Date: 02/12/13  Intake/Output from previous day: 09/30 0701 - 10/01 0700 In: 480 [P.O.:480] Out: 2500 [Urine:2500] Intake/Output this shift: Total I/O In: 480 [P.O.:480] Out: 800 [Urine:800]  General appearance: alert, cooperative, appears stated age and no distress Eyes: conjunctivae/corneas clear. PERRL, EOM's intact. Fundi benign. Neck: no adenopathy, no carotid bruit, no JVD, supple, symmetrical, trachea midline and thyroid not enlarged, symmetric, no tenderness/mass/nodules Back: symmetric, no curvature. ROM normal. No CVA tenderness. Resp: clear to auscultation bilaterally Chest wall: no tenderness Cardio: regular rate and rhythm, S1, S2 normal, no murmur, click, rub or gallop GI: soft, non-tender; bowel sounds normal; no masses,  no organomegaly Extremities: extremities normal, atraumatic, no cyanosis or edema Pulses: 2+ and symmetric Neurologic: Grossly normal  Lab Results:  Recent Labs  02/12/13 0400 02/13/13 0350  WBC 14.7* 14.0*  HGB 9.2* 9.4*  HCT 26.2* 27.0*  PLT 403* 444*   BMET  Recent Labs  02/11/13 0910 02/12/13 0400  NA 137 139  K 3.5 3.7  CL 103 103  CO2 24 25  GLUCOSE 92 99  BUN 6 6  CREATININE 0.59 0.68  CALCIUM 8.8 9.0     Studies/Results: No results found.  Medications: I have reviewed the patient's current medications.  Assessment/Plan: 22 yo admitted with sickle cell painful crisis. #1 Sickle Cell Painful Crisis:Patient doing better on current regimen. Will therefore not alter the dose. Will titrate off in the next 24 hours. Mobilize patient. #2 Depression:More stable. Continue Counseling as OP #3 Constipation: Resolved. #4 Sickle Cell Anemia: H/H stable.    LOS: 3 days   Leilanee Righetti,LAWAL 02/13/2013, 6:56 PM

## 2013-02-14 ENCOUNTER — Telehealth: Payer: Self-pay

## 2013-02-14 ENCOUNTER — Other Ambulatory Visit: Payer: Self-pay | Admitting: Internal Medicine

## 2013-02-14 DIAGNOSIS — G8929 Other chronic pain: Secondary | ICD-10-CM

## 2013-02-14 DIAGNOSIS — D571 Sickle-cell disease without crisis: Secondary | ICD-10-CM

## 2013-02-14 MED ORDER — OXYCODONE HCL 10 MG PO TABS: ORAL_TABLET | ORAL | Status: DC

## 2013-02-14 NOTE — Progress Notes (Signed)
Pt was stable at time of discharge. Reviewed discharge education with patient. She verbalized understanding. Informed her to pick up her prescriptions at the [facility] cell center per Dr. Anastasio Auerbach request. Removed patient's IV.

## 2013-02-14 NOTE — Progress Notes (Signed)
Patient ID: [name], female   DOB: October 30, 1990, 22 y.o.   MRN: [id number] [name] presented to the [facility] to pick up her prescriptions. This CM shared the information on in-home counseling, [name] stated she is willing to participate however her cell phone is disconnected and she should have a new phone number soon. This CM advised [name] to call back with her phone number and a referral will be sent to counseling facility. [name] agreed and verbalized understanding. This CM will continue to monitor.    Karoline Caldwell, RN, BSN, [place]    [phone]

## 2013-02-14 NOTE — Discharge Summary (Signed)
[name] MRN: [id number] DOB/AGE: [birth year] 22 y.o.  Admit date: 02/10/2013 Discharge date: 02/14/2013  Primary Care Physician:  MATTHEWS,MICHELLE A., MD   Discharge Diagnoses:   Patient Active Problem List   Diagnosis Date Noted  . Mood swings 11/08/2011    Priority: High  . Depression 01/06/2011    Priority: High  . SICKLE CELL ANEMIA 01/08/2009    Priority: High  . Anemia 10/05/2012    Priority: Medium  . 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 Protein-calorie malnutrition, severe 02/11/2013  . Streptococcal sore throat 12/31/2012    Class: Acute  . Fever, unspecified 10/06/2012  . Leukocytosis, unspecified 10/05/2012  . Sickle cell anemia with crisis   . Chronic back pain   . Leukocytosis 03/31/2012  . 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 Sickle cell pain crisis 03/29/2012  . Headache 02/13/2012  . Nausea & vomiting 02/13/2012  . Tachycardia 11/22/2011  . Right thigh pain 11/08/2011  . Migraines 11/08/2011  . Vaso-occlusive sickle cell crisis 10/08/2011  . Overweight 05/24/2011  . Stress 04/29/2011  . GERD (gastroesophageal reflux disease) 02/17/2011  . Back pain 09/17/2010  . Active smoker 08/09/2010  . 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     hydroxyurea 500 MG capsule  Commonly known as:  HYDREA  Take 2 capsules (1,000 mg total) by mouth at bedtime. May take with food to minimize GI side effects.     oxyCODONE 5 MG immediate release tablet  Commonly known as:  ROXICODONE  Take 1 tablet (5 mg total) by mouth 4 (four) times daily as needed for pain. May take 1 tablet by mouth every 6 hours as needed for PAIN     oxyCODONE 15 MG immediate release tablet  Commonly known as:  ROXICODONE  15 mg every 12 hrs     polyethylene glycol packet  Commonly known as:  MIRALAX /  GLYCOLAX  Take 17 g by mouth daily as needed (for constipation).     prenatal multivitamin Tabs tablet  Take 1 tablet by mouth daily at 12 noon.     sertraline 50 MG tablet  Commonly known as:  ZOLOFT  Take 1 tablet (50 mg total) by mouth daily.          Consults:     SIGNIFICANT DIAGNOSTIC STUDIES:  Dg Chest 2 View  02/11/2013   CLINICAL DATA:  Sickle cell crisis, cough, congestion.  EXAM: CHEST  2 VIEW  COMPARISON:  01/27/2013  FINDINGS: Heart and mediastinal contours are within normal limits. No focal opacities or effusions. No acute bony abnormality.  IMPRESSION: No active cardiopulmonary disease.   Electronically Signed   By: Charlett Nose M.D.   On: 02/11/2013 07:02   Dg Chest 2 View  01/27/2013   *RADIOLOGY REPORT*  Clinical Data: Sickle cell crisis, chest pain  CHEST - 2 VIEW  Comparison: 01/01/2013  Findings: Heart size and vascular pattern are normal.  Mild right upper lobe atelectasis.  Lungs otherwise clear.  No pleural effusions.  Scoliosis stable.  IMPRESSION: Mild hypoventilatory right upper lobe change.   Original Report Authenticated By: Esperanza Heir, M.D.     BRIEF ADMITTING H & P: [name] is a 22 y.o. female with known history of sickle cell disease presented to the  ER because of low back pain. Patient states over the last 2 days patient has been having low back pain and thigh pain. Patient states the pain is typical of her sickle cell pain crisis. Patient denies any chest pain focal deficits headache fever chills productive cough shortness of breath. In the ER despite giving multiple does of pain relief medications patient still has pain and has been admitted for sickle cell pain crisis.     Hospital Course:  Present on Admission:  . Vaso-occlusive sickle cell crisis: Pt was started on PCA at doses appropriate for opiate resistance. She had significant improvement on Dilaudid PCA at settings of : Loading dose- 1.6 mg, PCA bolus dose-0.8 mg, Lockout  interval 10 minutes, 4 hour limit 12 mg.  . Leukocytosis, unspecified: Felt to be associated with VOC. Almost at baseline at time of discharge.  . Depression: Pt continued on Zoloft.  Disposition and Follow-up:  Pt stable at discharge and discharged home.       Future Appointments Provider Department Dept Phone   02/21/2013 4:00 PM Altha Harm, MD [facility] [phone]      DISCHARGE EXAM:  General: Alert, awake, oriented x3, in no acute distress. Vital Signs:BP 95/50, HR 81, T 98 F (36.7 C), temperature source Oral, RR 16, height 5\' 2"  (1.575 m), weight 192 lb 10.9 oz (87.4 kg), last menstrual period 01/31/2013, SpO2 96.00%. HEENT: [place]/AT PEERL, EOMI, Anicteric  Heart: Regular rate and rhythm, without murmurs, rubs, gallops, PMI non-displaced, no heaves or thrills on palpation.  Lungs: Clear to auscultation, no wheezing or rhonchi noted. No increased vocal fremitus resonant to percussion  Abdomen: Soft, nontender, nondistended, positive bowel sounds, no masses no hepatosplenomegaly noted.  Neuro: No focal neurological deficits noted cranial nerves II through XII grossly intact. DTRs 2+ bilaterally upper and lower extremities. Strength normal in bilateral upper and lower extremities.  Musculoskeletal: No warm swelling or erythema around joints.  Psychiatric: Patient alert and oriented x3, good insight and cognition, good recent to remote recall.    Recent Labs  02/12/13 0400  NA 139  K 3.7  CL 103  CO2 25  GLUCOSE 99  BUN 6  CREATININE 0.68  CALCIUM 9.0    Recent Labs  02/12/13 0400  AST 19  ALT 13  ALKPHOS 65  BILITOT 1.6*  PROT 7.0  ALBUMIN 3.5   No results found for this basename: LIPASE, AMYLASE,  in the last 72 hours  Recent Labs  02/12/13 0400 02/13/13 0350  WBC 14.7* 14.0*  NEUTROABS 6.3 6.5  HGB 9.2* 9.4*  HCT 26.2* 27.0*  MCV 92.6 92.8  PLT 403* 444*   Total time for discharge including decision making and face to  face time is greater then 30 minutes. Signed: MATTHEWS,MICHELLE A. 02/14/2013, 11:07 AM

## 2013-02-14 NOTE — Progress Notes (Signed)
Went to check PCA pump this morning to document medication amount received overnight. I noticed settings on pump did not match order placed for patient. Asked Kristen RN to reset settings on pump with me. Together we clarified issue being wrong concentration of syringe entered (0.2 mg/ml had be entered instead of 0.3mg /ml). Corrected concentration on pump which allowed [place] to be able to enter in correct bolus amount (0.8mg  q 10 min). We both individually checked that the right settings were in PCA pump. I will continue to monitor patient.

## 2013-02-14 NOTE — Progress Notes (Signed)
Pt being released from hospital. Prescription given for Oxycodone 10 mg #60 to be taken ion a TID PRN dosing of 15-10-15 mg.

## 2013-02-17 IMAGING — US US OB TRANSVAGINAL
1 series · 14 of 25 positions shown · non-contrast
Comparison: none

[Series 1: us ob transvaginal · 25 acquisitions, 14 frames shown]
[im 1/25]
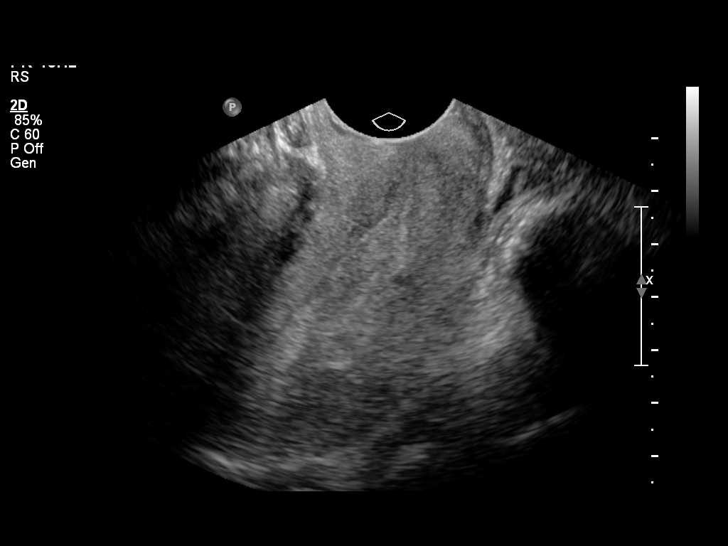
[im 3/25]
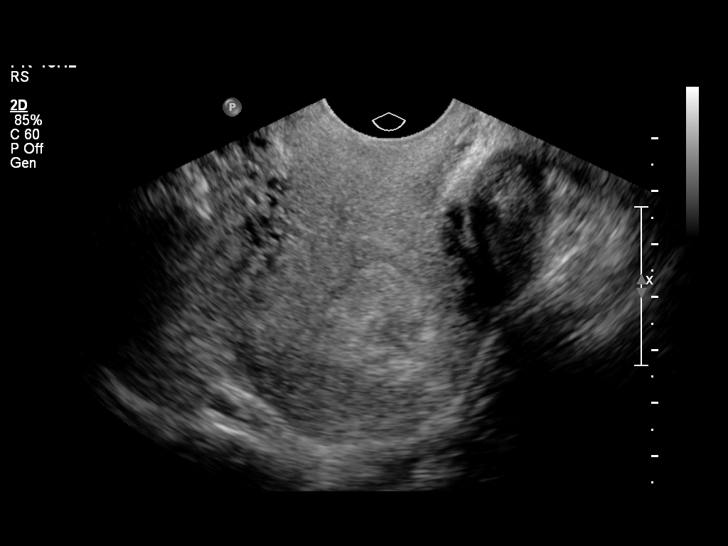
[im 5/25]
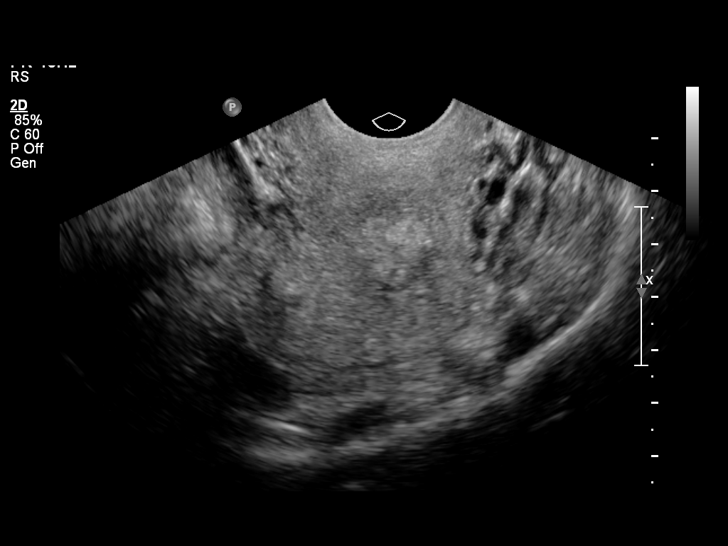
[im 7/25]
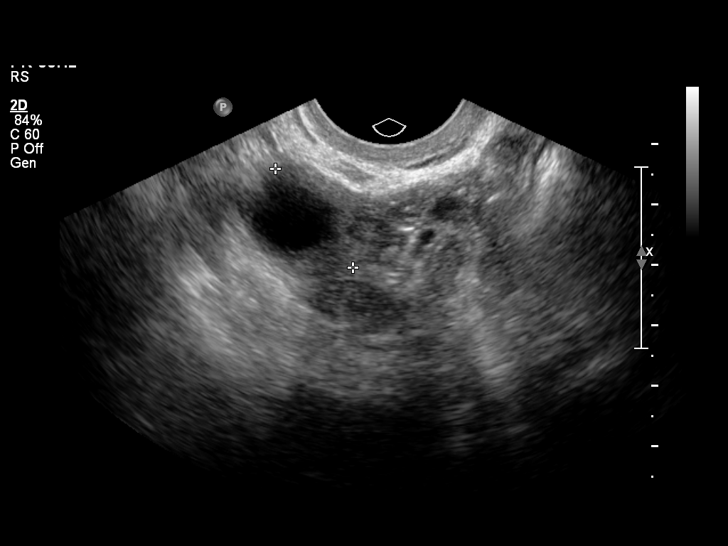
[im 9/25]
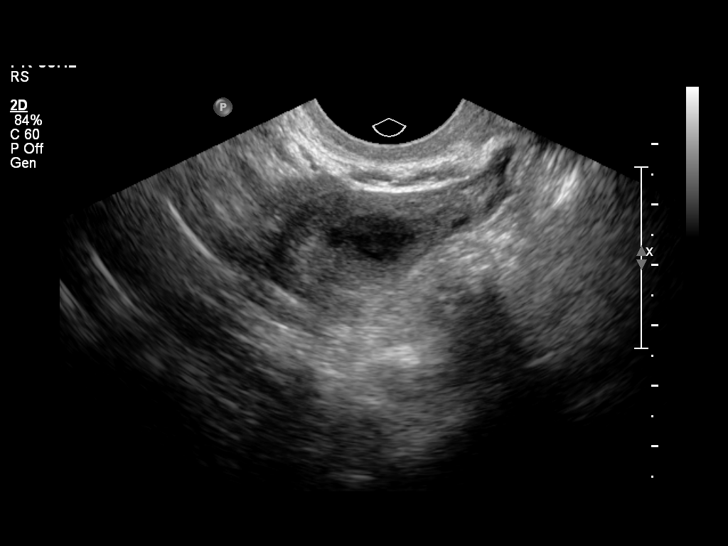
[im 10/25]
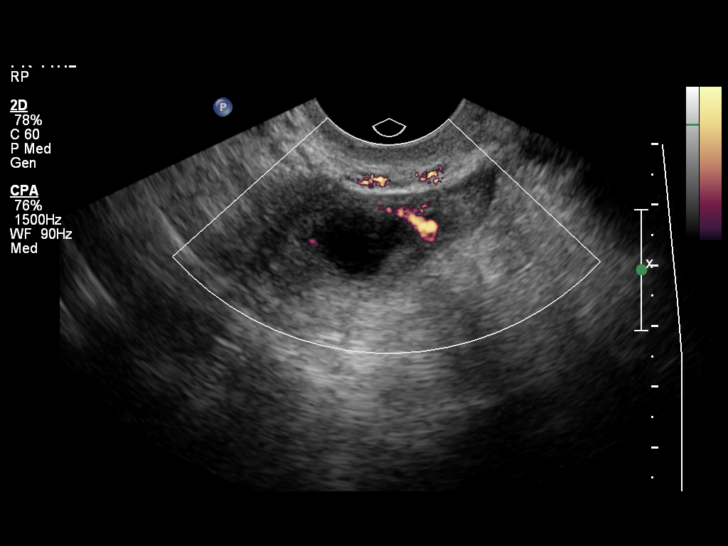
[im 12/25]
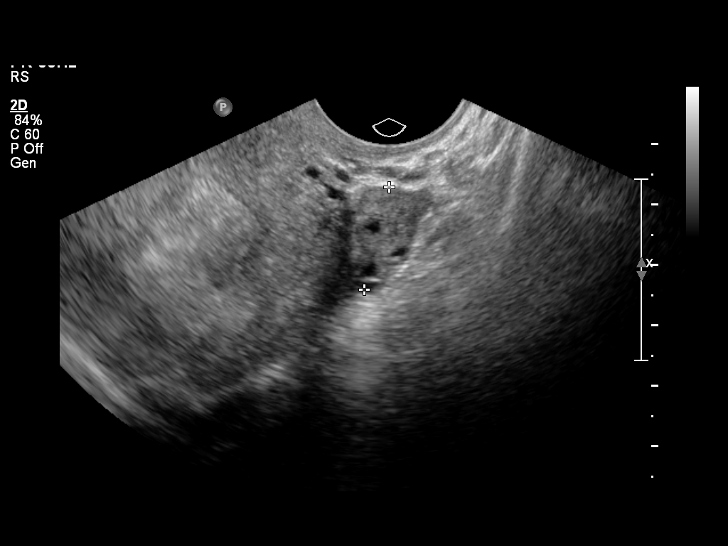
[im 14/25]
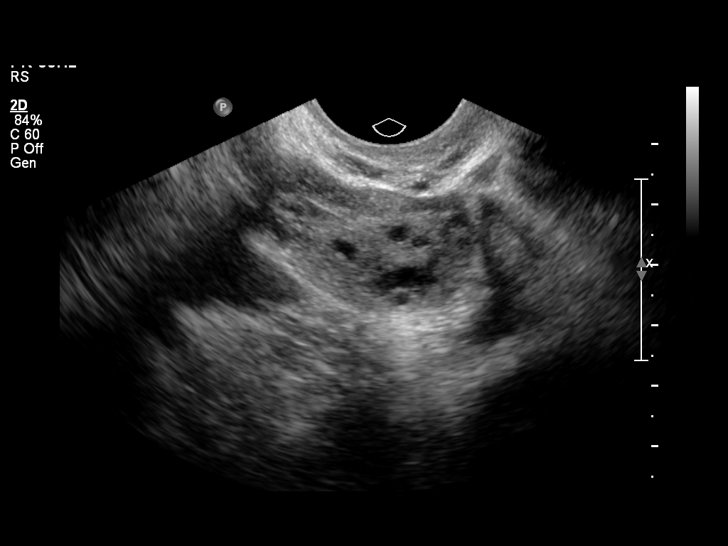
[im 16/25]
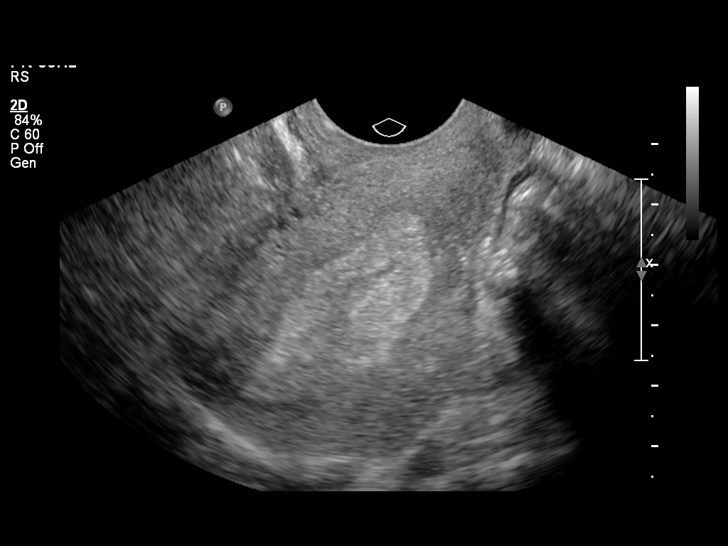
[im 17/25]
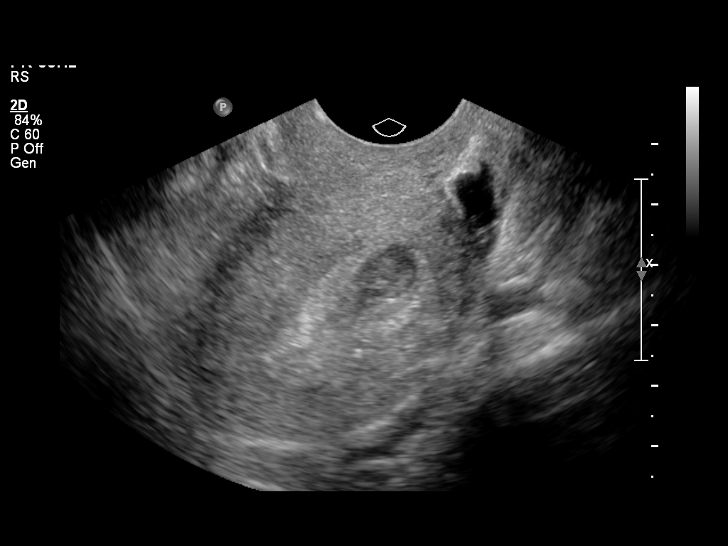
[im 19/25]
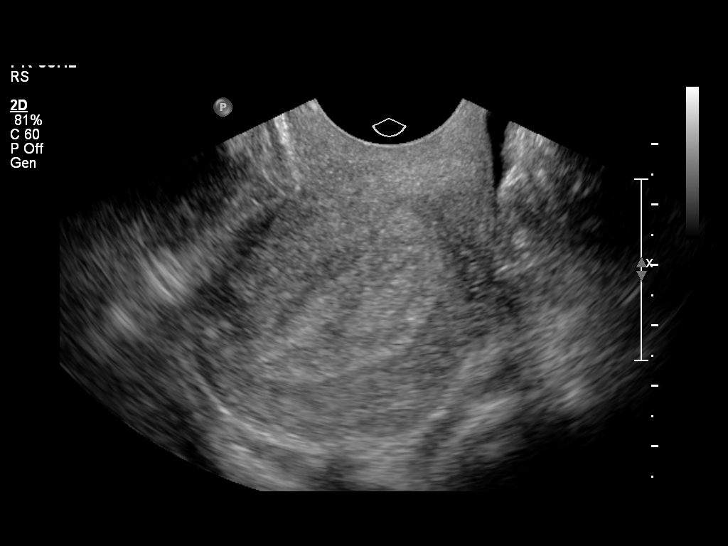
[im 21/25]
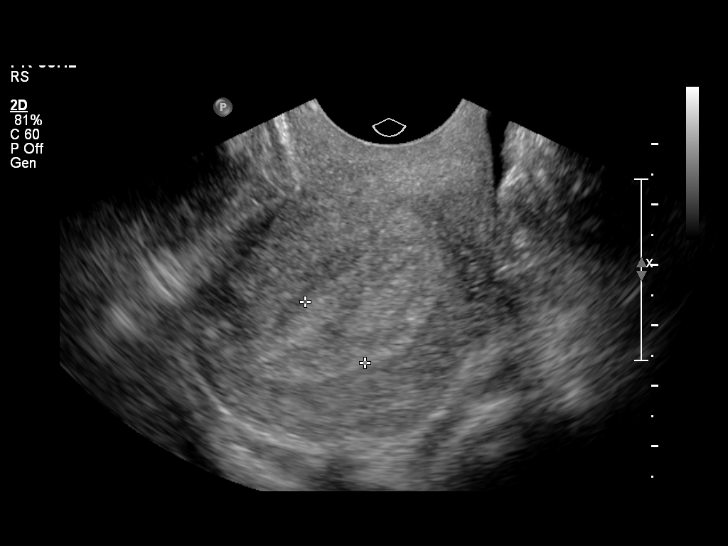
[im 23/25]
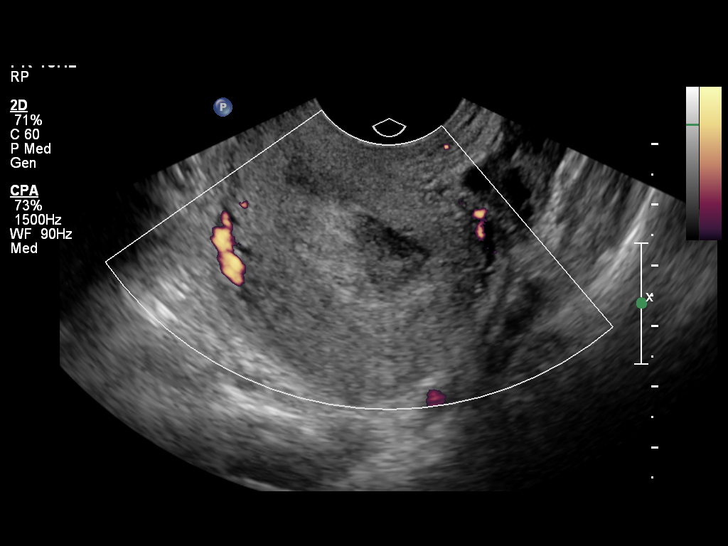
[im 25/25]
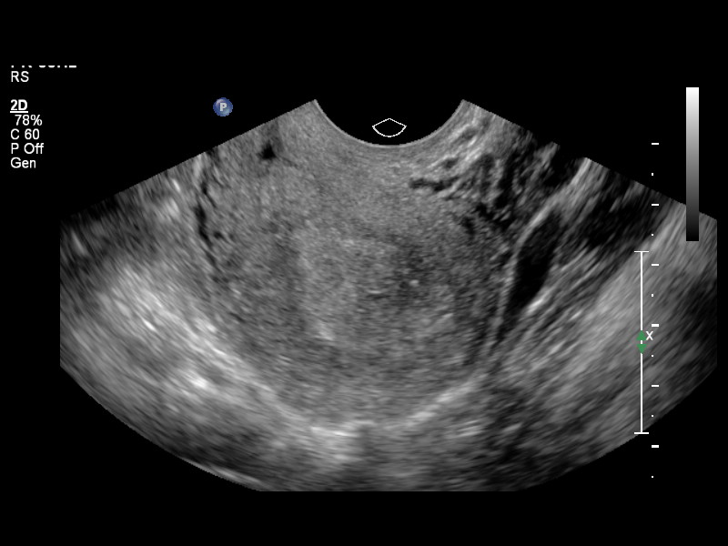

[14 of 25 positions shown; findings below may reference images not displayed]

OBSTETRICS REPORT
                      (Signed Final 04/04/2012 [DATE])

Service(s) Provided

 US OB TRANSVAGINAL                                    76817.0
Indications

 Vaginal bleeding, unknown etiology
 Poor obstetric history-Recurrent (habitual) abortion
 (3 consecutive ab's)
 Pregnancy with inconclusive fetal viability
 Sickle cell disease
Fetal Evaluation

 Num Of Fetuses:    1
 Preg. Location:    Intrauterine
 Gest. Sac:         No normal sac seen
 Yolk Sac:          Not visualized
 Fetal Pole:        Not visualized
 Cardiac Activity:  No embryo visualized

 Comment:    Gestational sac visualized 03/28/12 with yolk sac.
Cervix Uterus Adnexa

 Cervix:       Normal appearance by transvaginal scan
 Uterus:       Round complex fluid collection in LUS could represent
               blood clot or retained products

 Left Ovary:   No adnexal mass visualized.
 Right Ovary:  No adnexal mass visualized.
 Adnexa:     No abnormality visualized.
Impression

 Previously seen IUGS no longer visualized.  Complex fluid
 collection in LUS could represent blood clot vs. retained
 products of conception.

## 2013-02-18 NOTE — Telephone Encounter (Signed)
Opened in error

## 2013-02-21 ENCOUNTER — Ambulatory Visit: Payer: Medicare Other | Admitting: Internal Medicine

## 2013-02-25 ENCOUNTER — Ambulatory Visit: Payer: Medicare Other | Admitting: Internal Medicine

## 2013-02-26 ENCOUNTER — Ambulatory Visit (INDEPENDENT_AMBULATORY_CARE_PROVIDER_SITE_OTHER): Payer: Medicare Other | Admitting: Internal Medicine

## 2013-02-26 ENCOUNTER — Encounter: Payer: Self-pay | Admitting: Internal Medicine

## 2013-02-26 VITALS — BP 131/82 | HR 94 | Temp 98.2°F | Resp 16 | Ht 62.5 in | Wt 182.0 lb

## 2013-02-26 DIAGNOSIS — F32A Depression, unspecified: Secondary | ICD-10-CM

## 2013-02-26 DIAGNOSIS — D571 Sickle-cell disease without crisis: Secondary | ICD-10-CM

## 2013-02-26 DIAGNOSIS — G8929 Other chronic pain: Secondary | ICD-10-CM | POA: Diagnosis not present

## 2013-02-26 DIAGNOSIS — Z23 Encounter for immunization: Secondary | ICD-10-CM | POA: Insufficient documentation

## 2013-02-26 DIAGNOSIS — F329 Major depressive disorder, single episode, unspecified: Secondary | ICD-10-CM

## 2013-02-26 DIAGNOSIS — F3289 Other specified depressive episodes: Secondary | ICD-10-CM

## 2013-02-26 MED ORDER — SERTRALINE HCL 50 MG PO TABS
50.0000 mg | ORAL_TABLET | Freq: Every day | ORAL | Status: DC
Start: 2013-02-26 — End: 2013-06-28

## 2013-02-26 MED ORDER — OXYCODONE HCL 10 MG PO TABS: ORAL_TABLET | ORAL | Status: DC

## 2013-02-26 MED ORDER — FOLIC ACID 800 MCG PO TABS: 1600.0000 ug | ORAL_TABLET | Freq: Every morning | ORAL | Status: DC

## 2013-02-26 NOTE — Progress Notes (Signed)
  Subjective:    Patient ID: [name], female    DOB: May 03, 1991, 22 y.o.   MRN: [id number]  HPI: Pt here today for follow up and states that she has been doing very well. She has been taking mostly 15 mg but sometimes she feels that she can get by with 10 mg but states that she never takes more than 15 mg as prescribed.   Pt states that she has now got a job and she is excited about starting.   Pt states that she has not been consistently compliant with  Pt is accompanied today by her nest friend who appears to be very positive and supportive. She is encouraging of healthy practices.    Review of Systems  Constitutional: Negative.   HENT: Negative.   Eyes: Negative.   Respiratory: Negative.   Cardiovascular: Negative.   Gastrointestinal: Negative.   Endocrine: Negative.   Genitourinary: Negative.   Musculoskeletal: Positive for myalgias.  Skin: Negative.   Allergic/Immunologic: Negative.   Neurological: Negative.   Hematological: Negative.   Psychiatric/Behavioral: Negative.        Objective:   Physical Exam  Constitutional: She is oriented to person, place, and time. She appears well-developed and well-nourished.  HENT:  Head: Normocephalic and atraumatic.  Eyes: Conjunctivae and EOM are normal. Pupils are equal, round, and reactive to light. No scleral icterus.  Neck: Normal range of motion. Neck supple. No JVD present. No thyromegaly present.  Cardiovascular: Normal rate and regular rhythm.  Exam reveals no gallop and no friction rub.   No murmur heard. Pulmonary/Chest: Effort normal and breath sounds normal. She has no wheezes. She has no rales.  Abdominal: Soft. Bowel sounds are normal. She exhibits no distension and no mass. There is no tenderness.  Musculoskeletal: Normal range of motion.  Neurological: She is alert and oriented to person, place, and time. No cranial nerve deficit.  Skin: Skin is warm and dry.  Psychiatric: She has a normal mood and affect.  Her behavior is normal. Judgment and thought content normal.          Assessment & Plan:  1. HB SS without crisis: Pt reports doing well. She has cut down on the use of opiates and has been using Excedrin for management of some os the pain. I have cautioned about excessive use as it has both Acetaminophen  adn ASA as components of the drug.   -She reports that she has been taking her Hydrea consistently. Will check CBC in one month specifically looking at MCV.  2. Depression: Pt denies any symptoms of anhedonia.  Continue Zoloft. Pt with pleasant affect today.  3. Immunization: Influenza vaccine given today.  RTC: 1 month Labs: CBC wit diff on next visit.

## 2013-03-07 ENCOUNTER — Telehealth: Payer: Self-pay | Admitting: Internal Medicine

## 2013-03-07 DIAGNOSIS — D571 Sickle-cell disease without crisis: Secondary | ICD-10-CM

## 2013-03-07 DIAGNOSIS — G8929 Other chronic pain: Secondary | ICD-10-CM

## 2013-03-08 ENCOUNTER — Emergency Department (HOSPITAL_COMMUNITY)
Admission: EM | Admit: 2013-03-08 | Discharge: 2013-03-08 | Disposition: A | Discharge status: Home / Self Care | Payer: Medicare Other | Attending: Emergency Medicine | Admitting: Emergency Medicine

## 2013-03-08 ENCOUNTER — Telehealth (HOSPITAL_COMMUNITY): Payer: Self-pay | Admitting: Internal Medicine

## 2013-03-08 ENCOUNTER — Encounter (HOSPITAL_COMMUNITY): Payer: Self-pay | Admitting: Emergency Medicine

## 2013-03-08 DIAGNOSIS — Z91040 Latex allergy status: Secondary | ICD-10-CM | POA: Insufficient documentation

## 2013-03-08 DIAGNOSIS — F17200 Nicotine dependence, unspecified, uncomplicated: Secondary | ICD-10-CM | POA: Insufficient documentation

## 2013-03-08 DIAGNOSIS — D5700 Hb-SS disease with crisis, unspecified: Secondary | ICD-10-CM | POA: Diagnosis not present

## 2013-03-08 DIAGNOSIS — R109 Unspecified abdominal pain: Secondary | ICD-10-CM | POA: Diagnosis not present

## 2013-03-08 DIAGNOSIS — G8929 Other chronic pain: Secondary | ICD-10-CM | POA: Diagnosis not present

## 2013-03-08 DIAGNOSIS — R197 Diarrhea, unspecified: Secondary | ICD-10-CM | POA: Insufficient documentation

## 2013-03-08 DIAGNOSIS — Z8679 Personal history of other diseases of the circulatory system: Secondary | ICD-10-CM | POA: Diagnosis not present

## 2013-03-08 DIAGNOSIS — Z79899 Other long term (current) drug therapy: Secondary | ICD-10-CM | POA: Insufficient documentation

## 2013-03-08 DIAGNOSIS — Z8719 Personal history of other diseases of the digestive system: Secondary | ICD-10-CM | POA: Diagnosis not present

## 2013-03-08 DIAGNOSIS — F3289 Other specified depressive episodes: Secondary | ICD-10-CM | POA: Insufficient documentation

## 2013-03-08 DIAGNOSIS — R112 Nausea with vomiting, unspecified: Secondary | ICD-10-CM | POA: Insufficient documentation

## 2013-03-08 DIAGNOSIS — F6389 Other impulse disorders: Secondary | ICD-10-CM | POA: Insufficient documentation

## 2013-03-08 DIAGNOSIS — M549 Dorsalgia, unspecified: Secondary | ICD-10-CM | POA: Diagnosis not present

## 2013-03-08 DIAGNOSIS — F329 Major depressive disorder, single episode, unspecified: Secondary | ICD-10-CM | POA: Diagnosis not present

## 2013-03-08 MED ORDER — OXYCODONE HCL 5 MG PO TABS
10.0000 mg | ORAL_TABLET | Freq: Once | ORAL | Status: AC
  Administered 2013-03-08: 10 mg via ORAL
  Filled 2013-03-08: qty 2

## 2013-03-08 MED ORDER — ONDANSETRON 8 MG PO TBDP
8.0000 mg | ORAL_TABLET | Freq: Once | ORAL | Status: AC
  Administered 2013-03-08: 8 mg via ORAL
  Filled 2013-03-08: qty 1

## 2013-03-08 MED ORDER — OXYCODONE HCL 10 MG PO TABS: ORAL_TABLET | ORAL | Status: DC

## 2013-03-08 MED ORDER — ONDANSETRON HCL 4 MG PO TABS: 4.0000 mg | ORAL_TABLET | Freq: Four times a day (QID) | ORAL | Status: DC

## 2013-03-08 MED ORDER — OXYCODONE-ACETAMINOPHEN 5-325 MG PO TABS
2.0000 | ORAL_TABLET | Freq: Once | ORAL | Status: DC
  Filled 2013-03-08: qty 2

## 2013-03-08 NOTE — ED Notes (Signed)
Per pt, states symptoms for 2 days-N/V/D-c/o back pain-states Dr. Jerolyn Center told her to come here-has been out of pain meds for 2 days

## 2013-03-08 NOTE — ED Notes (Signed)
Bed: WA20 Expected date:  Expected time:  Means of arrival:  Comments: Hold for triage 

## 2013-03-08 NOTE — Telephone Encounter (Signed)
Pt Oxycodone 10mg  1 1/2 Q 8 hrs prn # 60 ready for pick up

## 2013-03-08 NOTE — ED Provider Notes (Signed)
CSN: [id number]     Arrival date & time 03/08/13  0932 History   First MD Initiated Contact with Patient 03/08/13 1004     Chief Complaint  Patient presents with  . Emesis  . Sickle Cell Pain Crisis   (Consider location/radiation/quality/duration/timing/severity/associated sxs/prior Treatment) HPI  22 year old female with history of trichotillomania, sickle cell anemia, and chronic back pain presents for pain management.  Patient states for the past 2 days she has had some mild abdominal discomfort with associate nausea vomiting diarrhea. States she vomits twice yesterday of nonbloody nonbilious content. She also had 2 bouts of loose stools without blood or mucus. She started her menstruation today. She has been without pain medication for the past 2 days. States her usual sickle cell related pain is more controlled. She did call the primary care Dr. to request for pain medication refill however when she mentioned that she has been having nausea and vomiting her Dr. recommend her to come to the ER for further care. Patient otherwise denies having fever, chills, headache, chest pain, shortness of breath, back pain, dysuria, hematochezia, melena. No recent travel, not eating any abnormal food. Patient also requests to return home and not being admitted. She also requests no IV access as she is a difficult stick.  She felt she does not need to be in the ER, but would like some pain management and medication for her nausea.  No other complaints.    Past Medical History  Diagnosis Date  . H/O: 1 miscarriage 03/22/2011  . 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 Depression 01/06/2011  . GERD (gastroesophageal reflux disease) 02/17/2011  . Trichotillomania     h/o  . Blood transfusion     "lots"  . Sickle cell anemia with crisis   . Exertional dyspnea     "sometimes"  . Sickle cell anemia   . Headache(784.0)   . Migraines 11/08/11    "@ least twice/month"  . Chronic back pain     "very severe; have knot in  my back; from tight muscle; take RX and exercise for it"  . Mood swings 11/08/11    "I go back and forth; real bad"  . Pregnancy   . Blood dyscrasia     SICKLE CELL   Past Surgical History  Procedure Laterality Date  . Cholecystectomy  05/2010  . Dilation and curettage of uterus  02/20/11    S/P miscarriage   Family History  Problem Relation Age of Onset  . Diabetes Mother   . Alcoholism    . Depression    . Hypercholesterolemia    . Hypertension    . Migraines    . Diabetes Maternal Grandmother   . Diabetes Paternal Grandmother    History  Substance Use Topics  . Smoking status: Current Every Day Smoker -- 0.25 packs/day for 1 years    Types: Cigarettes  . Smokeless tobacco: Never Used  . Alcohol Use: Yes     Comment: pt states she quit marijuan in May 2013. Rare ETOH, + cigarettes.  She is enrolled in school   OB History   Grav Para Term Preterm Abortions TAB SAB Ect Mult Living   2    1  1         Obstetric Comments   Miscarried in October 2012 at about 7 weeks     Review of Systems  Constitutional: Negative for fever.  Respiratory: Negative for shortness of breath.   Cardiovascular: Negative for chest pain.  Gastrointestinal: Positive for abdominal  pain.  Genitourinary: Negative for dysuria, hematuria and flank pain.  Skin: Negative for rash.  Neurological: Negative for headaches.  All other systems reviewed and are negative.    Allergies  Carrot oil and Latex  Home Medications   Current Outpatient Rx  Name  Route  Sig  Dispense  Refill  . folic acid (FOLVITE) 800 MCG tablet   Oral   Take 2 tablets (1,600 mcg total) by mouth every morning.         . hydroxyurea (HYDREA) 500 MG capsule   Oral   Take 2 capsules (1,000 mg total) by mouth at bedtime. May take with food to minimize GI side effects.   60 capsule   3   . Oxycodone HCl 10 MG TABS      Take medication on a TID PRN schedule of 15 mg (1.5 tabs), 10 mg, 15 mg (1.5 tabs).   60 tablet    0   . Prenatal Vit-Fe Fumarate-FA (PRENATAL MULTIVITAMIN) TABS tablet   Oral   Take 1 tablet by mouth daily at 12 noon.         . sertraline (ZOLOFT) 50 MG tablet   Oral   Take 1 tablet (50 mg total) by mouth daily.   30 tablet   3   . polyethylene glycol (MIRALAX / GLYCOLAX) packet   Oral   Take 17 g by mouth daily as needed (for constipation).          BP 116/80  Pulse 84  Temp(Src) 98.8 F (37.1 C) (Oral)  Resp 16  SpO2 98%  LMP 03/08/2013 Physical Exam  Nursing note and vitals reviewed. Constitutional: She appears well-developed and well-nourished. No distress.  Awake, alert, nontoxic appearance  HENT:  Head: Atraumatic.  Mouth/Throat: Oropharynx is clear and moist.  Eyes: Conjunctivae are normal. Right eye exhibits no discharge. Left eye exhibits no discharge.  Neck: Neck supple.  Cardiovascular: Normal rate and regular rhythm.   Pulmonary/Chest: Effort normal. No respiratory distress. She exhibits no tenderness.  Abdominal: Soft. There is no tenderness. There is no rebound.  Musculoskeletal: She exhibits no tenderness.  ROM appears intact, no obvious focal weakness  Neurological:  Mental status and motor strength appears intact  Skin: No rash noted.  Psychiatric: She has a normal mood and affect.    ED Course  Procedures (including critical care time)  10:26 AM Pt has sxs suggestive of gastroenteritis, however improving.  She does not appears dehydrated, and have stable normal VS.  She request no IV access and felt if her nausea improved she would like to return home and f/u with her PCP.  I do not suspect UTI, PNA, or other emergent condition.  Pt has close follow up and will also receive her pain meds from her doctor.  Therefore, will give zofran and pain meds.  Once pt feel better and tolerates PO will d/c with antinausea medication.  Pt agrees with plan.    Labs Review Labs Reviewed - No data to display Imaging Review No results found.  EKG  Interpretation   None       MDM   1. Nausea vomiting and diarrhea   2. Chronic pain    BP 116/80  Pulse 84  Temp(Src) 98.8 F (37.1 C) (Oral)  Resp 16  SpO2 98%  LMP 03/08/2013     Fayrene Helper, PA-C 03/08/13 1135

## 2013-03-14 ENCOUNTER — Telehealth: Payer: Self-pay | Admitting: Internal Medicine

## 2013-03-14 DIAGNOSIS — G8929 Other chronic pain: Secondary | ICD-10-CM

## 2013-03-14 DIAGNOSIS — D571 Sickle-cell disease without crisis: Secondary | ICD-10-CM

## 2013-03-15 ENCOUNTER — Telehealth: Payer: Self-pay | Admitting: *Deleted

## 2013-03-15 DIAGNOSIS — G8929 Other chronic pain: Secondary | ICD-10-CM

## 2013-03-15 DIAGNOSIS — D571 Sickle-cell disease without crisis: Secondary | ICD-10-CM

## 2013-03-15 MED ORDER — OXYCODONE HCL 10 MG PO TABS: ORAL_TABLET | ORAL | Status: DC

## 2013-03-15 NOTE — Telephone Encounter (Signed)
Prescription  for Oxycodone 10mg  1 1/2 TID prn # 60 ready for pick up on Mar 21, 2013

## 2013-03-15 NOTE — Telephone Encounter (Signed)
Pt called for refill on Oxycodone 10 mg

## 2013-03-15 NOTE — ED Provider Notes (Signed)
Medical screening examination/treatment/procedure(s) were performed by non-physician practitioner and as supervising physician I was immediately available for consultation/collaboration.  EKG Interpretation   None         Maree Ainley J Katalyna Socarras, MD 03/15/13 1849 

## 2013-03-18 MED ORDER — OXYCODONE HCL 10 MG PO TABS: ORAL_TABLET | ORAL | Status: DC

## 2013-03-18 NOTE — Telephone Encounter (Signed)
Prescription for Oxycodone 10 mg # 60 prescribed. Pt taking TID dosing of 15-10-15 mg which is about 4.5 tabs/day. Not due to be filled until 03/29/2013.

## 2013-03-19 ENCOUNTER — Telehealth (HOSPITAL_COMMUNITY): Payer: Self-pay

## 2013-03-19 NOTE — Telephone Encounter (Signed)
03/18/13 This CM received a call from [facility] with [facility] confirming all went well with initial appointment with [name]   03/19/13 This CM left voicemail message for Ms. Lingard to follow up on previous counseling visit and to determine any current needs     Karoline Caldwell, RN, BSN, [place]    [phone]

## 2013-03-21 IMAGING — CR DG SHOULDER 1V*L*
1 series · 1 of 1 positions shown · non-contrast
Comparison: none

REASON FOR EXAM: fall,pain
COMMENTS:

PROCEDURE:     DXR - DXR SHOULDER LEFT ONE VIEW  - May 06, 2012 [DATE]
RESULT:     Comparison: None.

[ap]
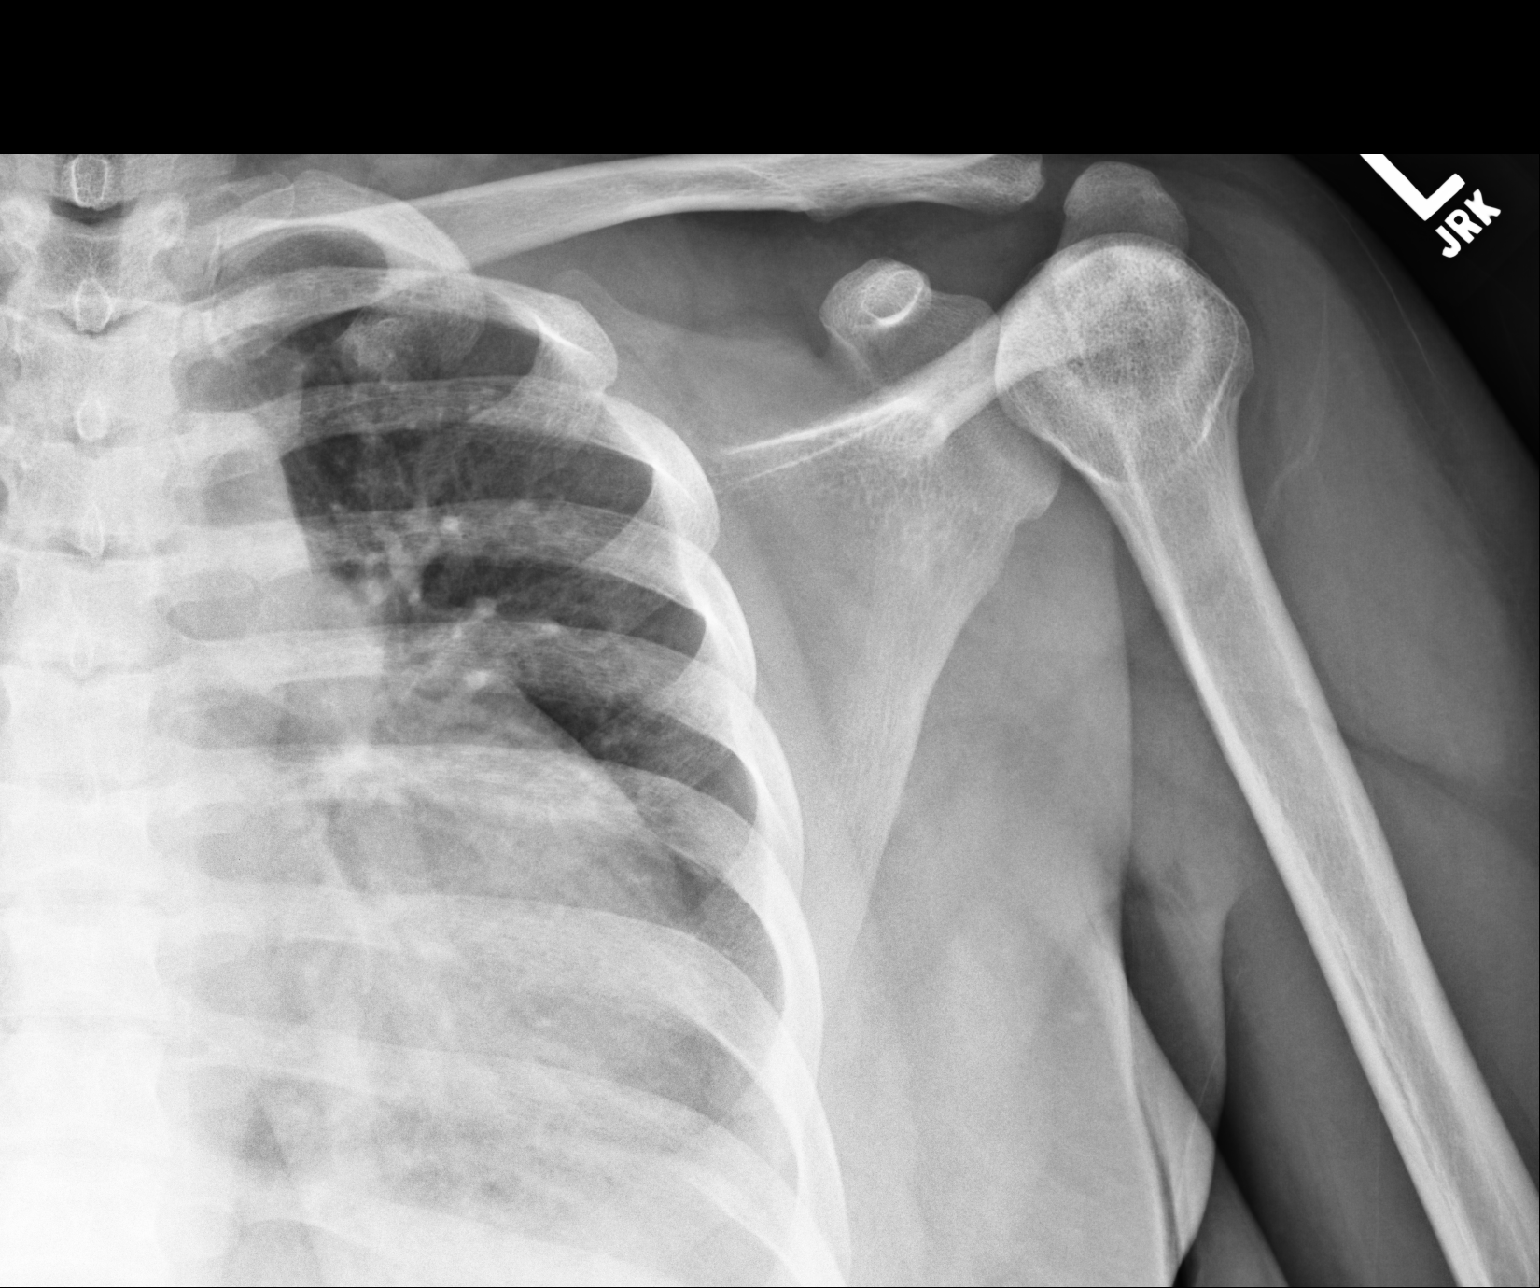

[1 of 1 positions shown; findings below may reference images not displayed]

FINDINGS: Only a single view was obtained. No definite fracture or dislocation, though
this evaluation is limited by the single view. If there is continued
clinical concern, a full shoulder radiographic series would be recommended.
IMPRESSION: Please see above.

[REDACTED]

## 2013-03-22 ENCOUNTER — Emergency Department (HOSPITAL_COMMUNITY): Payer: Medicare Other

## 2013-03-22 ENCOUNTER — Telehealth: Payer: Self-pay | Admitting: *Deleted

## 2013-03-22 ENCOUNTER — Inpatient Hospital Stay (HOSPITAL_COMMUNITY)
Admission: EM | Admit: 2013-03-22 | Discharge: 2013-03-29 | DRG: 812 | Disposition: A | Discharge status: Home / Self Care | Payer: Medicare Other | Attending: Internal Medicine | Admitting: Internal Medicine

## 2013-03-22 ENCOUNTER — Encounter (HOSPITAL_COMMUNITY): Payer: Self-pay | Admitting: Emergency Medicine

## 2013-03-22 DIAGNOSIS — F411 Generalized anxiety disorder: Secondary | ICD-10-CM | POA: Diagnosis present

## 2013-03-22 DIAGNOSIS — D473 Essential (hemorrhagic) thrombocythemia: Secondary | ICD-10-CM | POA: Diagnosis present

## 2013-03-22 DIAGNOSIS — D571 Sickle-cell disease without crisis: Secondary | ICD-10-CM

## 2013-03-22 DIAGNOSIS — Z833 Family history of diabetes mellitus: Secondary | ICD-10-CM | POA: Diagnosis not present

## 2013-03-22 DIAGNOSIS — G43909 Migraine, unspecified, not intractable, without status migrainosus: Secondary | ICD-10-CM

## 2013-03-22 DIAGNOSIS — E663 Overweight: Secondary | ICD-10-CM

## 2013-03-22 DIAGNOSIS — D75839 Thrombocytosis, unspecified: Secondary | ICD-10-CM

## 2013-03-22 DIAGNOSIS — D649 Anemia, unspecified: Secondary | ICD-10-CM

## 2013-03-22 DIAGNOSIS — R51 Headache: Secondary | ICD-10-CM

## 2013-03-22 DIAGNOSIS — D638 Anemia in other chronic diseases classified elsewhere: Secondary | ICD-10-CM | POA: Diagnosis present

## 2013-03-22 DIAGNOSIS — R112 Nausea with vomiting, unspecified: Secondary | ICD-10-CM | POA: Diagnosis present

## 2013-03-22 DIAGNOSIS — F6389 Other impulse disorders: Secondary | ICD-10-CM | POA: Diagnosis present

## 2013-03-22 DIAGNOSIS — M79651 Pain in right thigh: Secondary | ICD-10-CM

## 2013-03-22 DIAGNOSIS — F32A Depression, unspecified: Secondary | ICD-10-CM

## 2013-03-22 DIAGNOSIS — K5900 Constipation, unspecified: Secondary | ICD-10-CM | POA: Diagnosis not present

## 2013-03-22 DIAGNOSIS — M79609 Pain in unspecified limb: Secondary | ICD-10-CM | POA: Diagnosis not present

## 2013-03-22 DIAGNOSIS — R509 Fever, unspecified: Secondary | ICD-10-CM

## 2013-03-22 DIAGNOSIS — F329 Major depressive disorder, single episode, unspecified: Secondary | ICD-10-CM | POA: Diagnosis present

## 2013-03-22 DIAGNOSIS — F3289 Other specified depressive episodes: Secondary | ICD-10-CM | POA: Diagnosis present

## 2013-03-22 DIAGNOSIS — R000 Tachycardia, unspecified: Secondary | ICD-10-CM

## 2013-03-22 DIAGNOSIS — J020 Streptococcal pharyngitis: Secondary | ICD-10-CM

## 2013-03-22 DIAGNOSIS — Z23 Encounter for immunization: Secondary | ICD-10-CM

## 2013-03-22 DIAGNOSIS — M549 Dorsalgia, unspecified: Secondary | ICD-10-CM

## 2013-03-22 DIAGNOSIS — D72829 Elevated white blood cell count, unspecified: Secondary | ICD-10-CM | POA: Diagnosis not present

## 2013-03-22 DIAGNOSIS — R4586 Emotional lability: Secondary | ICD-10-CM

## 2013-03-22 DIAGNOSIS — G8929 Other chronic pain: Secondary | ICD-10-CM

## 2013-03-22 DIAGNOSIS — F17200 Nicotine dependence, unspecified, uncomplicated: Secondary | ICD-10-CM

## 2013-03-22 DIAGNOSIS — Z6832 Body mass index (BMI) 32.0-32.9, adult: Secondary | ICD-10-CM | POA: Diagnosis not present

## 2013-03-22 DIAGNOSIS — K219 Gastro-esophageal reflux disease without esophagitis: Secondary | ICD-10-CM | POA: Diagnosis not present

## 2013-03-22 DIAGNOSIS — F439 Reaction to severe stress, unspecified: Secondary | ICD-10-CM

## 2013-03-22 DIAGNOSIS — E43 Unspecified severe protein-calorie malnutrition: Secondary | ICD-10-CM

## 2013-03-22 DIAGNOSIS — D5700 Hb-SS disease with crisis, unspecified: Secondary | ICD-10-CM | POA: Diagnosis not present

## 2013-03-22 DIAGNOSIS — Z733 Stress, not elsewhere classified: Secondary | ICD-10-CM | POA: Diagnosis not present

## 2013-03-22 MED ORDER — PROMETHAZINE HCL 25 MG/ML IJ SOLN
12.5000 mg | Freq: Once | INTRAMUSCULAR | Status: AC
  Administered 2013-03-23: 12.5 mg via INTRAVENOUS
  Filled 2013-03-22: qty 1

## 2013-03-22 MED ORDER — HYDROMORPHONE HCL PF 2 MG/ML IJ SOLN
3.0000 mg | Freq: Once | INTRAMUSCULAR | Status: AC
  Administered 2013-03-23: 3 mg via INTRAVENOUS
  Filled 2013-03-22: qty 2

## 2013-03-22 MED ORDER — DIPHENHYDRAMINE HCL 50 MG/ML IJ SOLN
12.5000 mg | Freq: Once | INTRAMUSCULAR | Status: AC
  Administered 2013-03-23: 12.5 mg via INTRAVENOUS
  Filled 2013-03-22: qty 1

## 2013-03-22 MED ORDER — SODIUM CHLORIDE 0.9 % IV BOLUS (SEPSIS)
1000.0000 mL | Freq: Once | INTRAVENOUS | Status: AC
  Administered 2013-03-23: 1000 mL via INTRAVENOUS

## 2013-03-22 NOTE — ED Provider Notes (Signed)
CSN: [id number]     Arrival date & time 03/22/13  2156 History   First MD Initiated Contact with Patient 03/22/13 2226     No chief complaint on file.  (Consider location/radiation/quality/duration/timing/severity/associated sxs/prior Treatment) HPI Comments: Patient with a history of sickle cell SS presents to the ER today with worsening of her typical sickle cell pain.  She was seen earlier in the day with Dr. Ashley Royalty, given pain medication and encouraged to increase fluid intake and to return if needed.  She states that she has tried these measures at home with increasing worsening of pain.  She reports pain to left ankle and knee without erythema or swelling.  Also here with left elbow pain with radiation into left shoulder and chest.  She reports this is her usual pain locations, she denies any history of acute chest.  She reports unknown last Hgb, reports last transfusion September of this year when last admission was.  She reports that when the weather gets cold that she usually has an exacerbation of her pain.  She denies fever, chills, stroke in the past.  Also denies nausea, vomiting or abdominal pain.  Patient is a 22 y.o. female presenting with sickle cell pain. The history is provided by the patient. No language interpreter was used.  Sickle Cell Pain Crisis Location:  Upper extremity and lower extremity Severity:  Severe Onset quality:  Gradual Duration:  24 hours Similar to previous crisis episodes: yes   Timing:  Constant Progression:  Worsening Chronicity:  Recurrent Sickle cell genotype:  SS Usual hemoglobin level:  Unknown Date of last transfusion:  01/2013 Frequency of attacks:  Weekly History of pulmonary emboli: no   Context: cold exposure   Context: not change in medication, not infection, not non-compliance, not low humidity, not menses, not pregnancy and not stress   Relieved by:  Nothing Worsened by:  Movement Ineffective treatments:  Prescription drugs and  hydroxyurea Associated symptoms: chest pain   Associated symptoms: no congestion, no cough, no fatigue, no fever, no headaches, no nausea, no shortness of breath, no sore throat, no swelling of legs, no vomiting and no wheezing   Risk factors: no frequent admissions for pain, no hx of pneumonia and no hx of stroke     Past Medical History  Diagnosis Date  . H/O: 1 miscarriage 03/22/2011  . 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 Depression 01/06/2011  . GERD (gastroesophageal reflux disease) 02/17/2011  . Trichotillomania     h/o  . Blood transfusion     "lots"  . Sickle cell anemia with crisis   . Exertional dyspnea     "sometimes"  . Sickle cell anemia   . Headache(784.0)   . Migraines 11/08/11    "@ least twice/month"  . Chronic back pain     "very severe; have knot in my back; from tight muscle; take RX and exercise for it"  . Mood swings 11/08/11    "I go back and forth; real bad"  . Pregnancy   . Blood dyscrasia     SICKLE CELL   Past Surgical History  Procedure Laterality Date  . Cholecystectomy  05/2010  . Dilation and curettage of uterus  02/20/11    S/P miscarriage   Family History  Problem Relation Age of Onset  . Diabetes Mother   . Alcoholism    . Depression    . Hypercholesterolemia    . Hypertension    . Migraines    . Diabetes Maternal Grandmother   .  Diabetes Paternal Grandmother    History  Substance Use Topics  . Smoking status: Current Every Day Smoker -- 0.25 packs/day for 1 years    Types: Cigarettes  . Smokeless tobacco: Never Used  . Alcohol Use: Yes     Comment: pt states she quit marijuan in May 2013. Rare ETOH, + cigarettes.  She is enrolled in school   OB History   Grav Para Term Preterm Abortions TAB SAB Ect Mult Living   2    1  1         Obstetric Comments   Miscarried in October 2012 at about 7 weeks     Review of Systems  Constitutional: Negative for fever and fatigue.  HENT: Negative for congestion and sore throat.   Respiratory:  Negative for cough, shortness of breath and wheezing.   Cardiovascular: Positive for chest pain.  Gastrointestinal: Negative for nausea and vomiting.  Neurological: Negative for headaches.  All other systems reviewed and are negative.    Allergies  Carrot oil and Latex  Home Medications   Current Outpatient Rx  Name  Route  Sig  Dispense  Refill  . folic acid (FOLVITE) 800 MCG tablet   Oral   Take 2 tablets (1,600 mcg total) by mouth every morning.         . hydroxyurea (HYDREA) 500 MG capsule   Oral   Take 2 capsules (1,000 mg total) by mouth at bedtime. May take with food to minimize GI side effects.   60 capsule   3   . ondansetron (ZOFRAN) 4 MG tablet   Oral   Take 1 tablet (4 mg total) by mouth every 6 (six) hours.   12 tablet   0   . Oxycodone HCl 10 MG TABS      Take medication on a TID PRN schedule of 15 mg (1.5 tabs), 10 mg, 15 mg (1.5 tabs).   60 tablet   0   . polyethylene glycol (MIRALAX / GLYCOLAX) packet   Oral   Take 17 g by mouth daily as needed (for constipation).         . Prenatal Vit-Fe Fumarate-FA (PRENATAL MULTIVITAMIN) TABS tablet   Oral   Take 1 tablet by mouth daily at 12 noon.         . sertraline (ZOLOFT) 50 MG tablet   Oral   Take 1 tablet (50 mg total) by mouth daily.   30 tablet   3    BP 118/68  Pulse 107  Temp(Src) 98.9 F (37.2 C) (Oral)  Resp 16  SpO2 97%  LMP 03/08/2013 Physical Exam  Nursing note and vitals reviewed. Constitutional: She is oriented to person, place, and time. She appears well-developed and well-nourished. She appears distressed.  HENT:  Head: Normocephalic and atraumatic.  Right Ear: External ear normal.  Left Ear: External ear normal.  Nose: Nose normal.  Mouth/Throat: Oropharynx is clear and moist. No oropharyngeal exudate.  Eyes: Conjunctivae are normal. Pupils are equal, round, and reactive to light. No scleral icterus.  Neck: Normal range of motion. Neck supple.  Cardiovascular:  Normal rate, regular rhythm and normal heart sounds.  Exam reveals no gallop and no friction rub.   No murmur heard. Pulmonary/Chest: Effort normal and breath sounds normal. No respiratory distress. She has no wheezes. She has no rales. She exhibits no tenderness.  Abdominal: Soft. Bowel sounds are normal. She exhibits no distension and no mass. There is no tenderness. There is no rebound and no  guarding.  Musculoskeletal: She exhibits tenderness. She exhibits no edema.  Tenderness to palpation left shoulder, elbow, left knee and ankle - no erythema, streaking, 2+ radial pulse, 2+ dorsalis pedis pulse  Lymphadenopathy:    She has no cervical adenopathy.  Neurological: She is alert and oriented to person, place, and time. She exhibits normal muscle tone. Coordination normal.  Skin: Skin is warm and dry. No rash noted. No erythema. No pallor.  Psychiatric: She has a normal mood and affect. Her behavior is normal. Judgment and thought content normal.    ED Course  Procedures (including critical care time) Labs Review Labs Reviewed  CBC WITH DIFFERENTIAL  RETICULOCYTES  PREGNANCY, URINE  URINALYSIS, ROUTINE W REFLEX MICROSCOPIC   Imaging Review Dg Chest 2 View  03/22/2013   CLINICAL DATA:  Sickle cell crisis  EXAM: CHEST  2 VIEW  COMPARISON:  02/11/2013  FINDINGS: The heart size and mediastinal contours are within normal limits. Scarring identified in the right upper lobe. No pneumonia. Skeletal changes of sickle cell disease identified.  IMPRESSION: No active cardiopulmonary disease.   Electronically Signed   By: Signa Kell M.D.   On: 03/22/2013 23:43    EKG Interpretation   None       Date: 03/23/2013  Rate: 94  Rhythm: sinus  QRS Axis: normal 72  Intervals:normal PR 156, QT 347, QTc 434  ST/T Wave abnormalities: flatening of t anterior leads  Conduction Disutrbances:none  Narrative Interpretation: normal ekg  Old EKG Reviewed: no acute changes  12:53 AM Reports no relief  in pain, will re-dose the dilaudid 3mg .  12:58 AM Review of care plan per Dr. Ashley Royalty, ordered patient toradol 30mg  IV as well.  Will re-assess pain after the 6mg  of dilaudid, if continues in pain according to care plan will admit.    1:06 AM Care of patient turned over to Dr. Ranae Palms, he will follow for possible admission.  Review of notes also reveals that the patient is out of her pain medication which she did not tell me.  MDM  Sickle Cell Crisis      Izola Price. Marisue Humble, PA-C 03/23/13 0107

## 2013-03-22 NOTE — Telephone Encounter (Signed)
Return call to Dr. Ashley Royalty due to lost signal. Call back # [phone]

## 2013-03-22 NOTE — ED Notes (Signed)
IV Team At bedside for IV stick

## 2013-03-22 NOTE — ED Notes (Signed)
Pt Arrived to ED with a complaint of a sickle cell pain crisis. Pt states pain began today with pain to her ankles that radiates to her knee and from her elbow that radiates to her shoulder. Pt is a patient of the sickle cell center but has been unable to control her pain with her prescription.

## 2013-03-22 NOTE — Telephone Encounter (Signed)
Pt states she is out of her Oxycodone 10 mg which is not due until 03/29/13. She would like to speak with the Dr. Ashley Royalty

## 2013-03-22 NOTE — Telephone Encounter (Signed)
Close encounter 

## 2013-03-23 DIAGNOSIS — D72829 Elevated white blood cell count, unspecified: Secondary | ICD-10-CM

## 2013-03-23 DIAGNOSIS — K219 Gastro-esophageal reflux disease without esophagitis: Secondary | ICD-10-CM

## 2013-03-23 DIAGNOSIS — D5700 Hb-SS disease with crisis, unspecified: Secondary | ICD-10-CM | POA: Diagnosis not present

## 2013-03-23 DIAGNOSIS — D649 Anemia, unspecified: Secondary | ICD-10-CM | POA: Diagnosis not present

## 2013-03-23 LAB — POCT I-STAT TROPONIN I: Troponin i, poc: 0 ng/mL (ref 0.00–0.08)

## 2013-03-23 LAB — COMPREHENSIVE METABOLIC PANEL
ALT: 15 U/L (ref 0–35)
AST: 19 U/L (ref 0–37)
Alkaline Phosphatase: 58 U/L (ref 39–117)
BUN: 7 mg/dL (ref 6–23)
CO2: 24 mEq/L (ref 19–32)
Calcium: 9.2 mg/dL (ref 8.4–10.5)
GFR calc Af Amer: >90 mL/min (ref >90)
GFR calc non Af Amer: >90 mL/min (ref >90)
Potassium: 3.5 mEq/L (ref 3.5–5.1)
Sodium: 136 mEq/L (ref 135–145)
Total Bilirubin: 2.6 mg/dL — ABNORMAL HIGH (ref 0.3–1.2)
Total Protein: 7 g/dL (ref 6.0–8.3)

## 2013-03-23 LAB — POCT I-STAT, CHEM 8
BUN: 6 mg/dL (ref 6–23)
Calcium, Ion: 1.19 mmol/L (ref 1.12–1.23)
Chloride: 103 mEq/L (ref 96–112)
Creatinine, Ser: 0.7 mg/dL (ref 0.50–1.10)
Glucose, Bld: 91 mg/dL (ref 70–99)
HCT: 30 % — ABNORMAL LOW (ref 36.0–46.0)
Hemoglobin: 10.2 g/dL — ABNORMAL LOW (ref 12.0–15.0)
Potassium: 3.7 meq/L (ref 3.5–5.1)
Sodium: 139 mEq/L (ref 135–145)
TCO2: 22 mmol/L (ref 0–100)

## 2013-03-23 LAB — CBC WITH DIFFERENTIAL/PLATELET
Basophils Absolute: 0.2 10*3/uL — ABNORMAL HIGH (ref 0.0–0.1)
Basophils Relative: 1 % (ref 0–1)
Basophils Relative: 1 % (ref 0–1)
Eosinophils Absolute: 0.2 10*3/uL (ref 0.0–0.7)
Eosinophils Relative: 1 % (ref 0–5)
Eosinophils Relative: 1 % (ref 0–5)
Hemoglobin: 8.5 g/dL — ABNORMAL LOW (ref 12.0–15.0)
Lymphs Abs: 3.5 10*3/uL (ref 0.7–4.0)
Lymphs Abs: 4.1 10*3/uL — ABNORMAL HIGH (ref 0.7–4.0)
MCH: 31.6 pg (ref 26.0–34.0)
MCV: 90 fL (ref 78.0–100.0)
MCV: 90 fL (ref 78.0–100.0)
Monocytes Absolute: 1.9 10*3/uL — ABNORMAL HIGH (ref 0.1–1.0)
Monocytes Relative: 11 % (ref 3–12)
Neutro Abs: 14.4 10*3/uL — ABNORMAL HIGH (ref 1.7–7.7)
Neutrophils Relative %: 70 % (ref 43–77)
Platelets: 481 10*3/uL — ABNORMAL HIGH (ref 150–400)
RBC: 2.8 MIL/uL — ABNORMAL LOW (ref 3.87–5.11)
RBC: 2.69 MIL/uL — ABNORMAL LOW (ref 3.87–5.11)
RDW: 16.5 % — ABNORMAL HIGH (ref 11.5–15.5)
RDW: 16.8 % — ABNORMAL HIGH (ref 11.5–15.5)
WBC: 20.6 10*3/uL — ABNORMAL HIGH (ref 4.0–10.5)

## 2013-03-23 LAB — URINALYSIS, ROUTINE W REFLEX MICROSCOPIC
Bilirubin Urine: NEGATIVE
Glucose, UA: NEGATIVE mg/dL
Ketones, ur: NEGATIVE mg/dL
Leukocytes, UA: NEGATIVE
Nitrite: NEGATIVE
Urobilinogen, UA: 1 mg/dL (ref 0.0–1.0)
pH: 5.5 (ref 5.0–8.0)

## 2013-03-23 LAB — RETICULOCYTES
RBC.: 2.8 MIL/uL — ABNORMAL LOW (ref 3.87–5.11)
Retic Count, Absolute: 540.4 10*3/uL — ABNORMAL HIGH (ref 19.0–186.0)
Retic Ct Pct: 19.3 % — ABNORMAL HIGH (ref 0.4–3.1)

## 2013-03-23 LAB — PREGNANCY, URINE: Preg Test, Ur: NEGATIVE

## 2013-03-23 MED ORDER — ZOLPIDEM TARTRATE 5 MG PO TABS: 5.0000 mg | ORAL_TABLET | Freq: Every evening | ORAL | Status: DC | PRN

## 2013-03-23 MED ORDER — POLYETHYLENE GLYCOL 3350 17 G PO PACK
17.0000 g | PACK | Freq: Every day | ORAL | Status: DC | PRN
  Administered 2013-03-28: 17 g via ORAL
  Filled 2013-03-23 (×2): qty 1

## 2013-03-23 MED ORDER — OXYCODONE HCL 5 MG PO TABS: 10.0000 mg | ORAL_TABLET | Freq: Four times a day (QID) | ORAL | Status: DC | PRN

## 2013-03-23 MED ORDER — POTASSIUM CHLORIDE IN NACL 20-0.45 MEQ/L-% IV SOLN
INTRAVENOUS | Status: DC
  Administered 2013-03-23 – 2013-03-24 (×3): via INTRAVENOUS
  Filled 2013-03-23 (×3): qty 1000

## 2013-03-23 MED ORDER — ONDANSETRON HCL 4 MG/2ML IJ SOLN
4.0000 mg | INTRAMUSCULAR | Status: DC | PRN
  Administered 2013-03-23: 4 mg via INTRAVENOUS
  Filled 2013-03-23: qty 2

## 2013-03-23 MED ORDER — FOLIC ACID 800 MCG PO TABS: 800.0000 ug | ORAL_TABLET | Freq: Every day | ORAL | Status: DC

## 2013-03-23 MED ORDER — SENNOSIDES-DOCUSATE SODIUM 8.6-50 MG PO TABS: 1.0000 | ORAL_TABLET | Freq: Two times a day (BID) | ORAL | Status: DC

## 2013-03-23 MED ORDER — DIPHENHYDRAMINE HCL 12.5 MG/5ML PO ELIX: 12.5000 mg | ORAL_SOLUTION | Freq: Four times a day (QID) | ORAL | Status: DC | PRN

## 2013-03-23 MED ORDER — FOLIC ACID 1 MG PO TABS
1.0000 mg | ORAL_TABLET | Freq: Every day | ORAL | Status: DC
  Administered 2013-03-23 – 2013-03-29 (×7): 1 mg via ORAL
  Filled 2013-03-23 (×7): qty 1

## 2013-03-23 MED ORDER — HYDROXYUREA 500 MG PO CAPS
1000.0000 mg | ORAL_CAPSULE | Freq: Every day | ORAL | Status: DC
  Administered 2013-03-23 – 2013-03-28 (×6): 1000 mg via ORAL
  Filled 2013-03-23 (×7): qty 2

## 2013-03-23 MED ORDER — KETOROLAC TROMETHAMINE 15 MG/ML IJ SOLN
15.0000 mg | Freq: Four times a day (QID) | INTRAMUSCULAR | Status: AC
  Administered 2013-03-23 – 2013-03-27 (×20): 15 mg via INTRAVENOUS
  Filled 2013-03-23 (×24): qty 1

## 2013-03-23 MED ORDER — KETOROLAC TROMETHAMINE 30 MG/ML IJ SOLN
30.0000 mg | Freq: Once | INTRAMUSCULAR | Status: AC
  Administered 2013-03-23: 30 mg via INTRAVENOUS
  Filled 2013-03-23: qty 1

## 2013-03-23 MED ORDER — NALOXONE HCL 0.4 MG/ML IJ SOLN: 0.4000 mg | INTRAMUSCULAR | Status: DC | PRN

## 2013-03-23 MED ORDER — PANTOPRAZOLE SODIUM 40 MG IV SOLR
40.0000 mg | Freq: Every day | INTRAVENOUS | Status: DC
  Administered 2013-03-23 – 2013-03-28 (×2): 40 mg via INTRAVENOUS
  Filled 2013-03-23 (×7): qty 40

## 2013-03-23 MED ORDER — SERTRALINE HCL 50 MG PO TABS
50.0000 mg | ORAL_TABLET | Freq: Every day | ORAL | Status: DC
  Administered 2013-03-23 – 2013-03-29 (×7): 50 mg via ORAL
  Filled 2013-03-23 (×7): qty 1

## 2013-03-23 MED ORDER — HYDROMORPHONE HCL PF 2 MG/ML IJ SOLN
3.0000 mg | Freq: Once | INTRAMUSCULAR | Status: AC
  Administered 2013-03-23: 3 mg via INTRAVENOUS
  Filled 2013-03-23: qty 2

## 2013-03-23 MED ORDER — HYDROMORPHONE 0.3 MG/ML IV SOLN
INTRAVENOUS | Status: DC
  Administered 2013-03-23: 2.1 mg via INTRAVENOUS
  Administered 2013-03-23: 1.8 mg via INTRAVENOUS
  Administered 2013-03-23: 1.2 mg via INTRAVENOUS
  Administered 2013-03-23: 23:00:00 via INTRAVENOUS
  Administered 2013-03-23: 0.3 mg via INTRAVENOUS
  Administered 2013-03-24: 2.6 mg via INTRAVENOUS
  Administered 2013-03-24: 10:00:00 via INTRAVENOUS
  Administered 2013-03-24: 3.6 mL via INTRAVENOUS
  Filled 2013-03-23 (×3): qty 25

## 2013-03-23 MED ORDER — HYDROMORPHONE HCL PF 1 MG/ML IJ SOLN: 1.0000 mg | INTRAMUSCULAR | Status: DC | PRN

## 2013-03-23 MED ORDER — SODIUM CHLORIDE 0.9 % IJ SOLN: 9.0000 mL | INTRAMUSCULAR | Status: DC | PRN

## 2013-03-23 MED ORDER — MAGNESIUM CITRATE PO SOLN: 1.0000 | Freq: Once | ORAL | Status: AC | PRN

## 2013-03-23 MED ORDER — PROMETHAZINE HCL 25 MG/ML IJ SOLN
25.0000 mg | Freq: Four times a day (QID) | INTRAMUSCULAR | Status: DC | PRN
  Filled 2013-03-23: qty 1

## 2013-03-23 MED ORDER — DIPHENHYDRAMINE HCL 50 MG/ML IJ SOLN
12.5000 mg | Freq: Four times a day (QID) | INTRAMUSCULAR | Status: DC | PRN
  Filled 2013-03-23: qty 1

## 2013-03-23 MED ORDER — BISACODYL 10 MG RE SUPP: 10.0000 mg | Freq: Every day | RECTAL | Status: DC | PRN

## 2013-03-23 MED ORDER — ONDANSETRON HCL 4 MG PO TABS: 4.0000 mg | ORAL_TABLET | ORAL | Status: DC | PRN

## 2013-03-23 MED ORDER — ENOXAPARIN SODIUM 40 MG/0.4ML ~~LOC~~ SOLN
40.0000 mg | SUBCUTANEOUS | Status: DC
  Administered 2013-03-23 – 2013-03-29 (×7): 40 mg via SUBCUTANEOUS
  Filled 2013-03-23 (×7): qty 0.4

## 2013-03-23 MED ORDER — SODIUM CHLORIDE 0.9 % IV SOLN
INTRAVENOUS | Status: DC
  Administered 2013-03-23: 06:00:00 via INTRAVENOUS

## 2013-03-23 MED ORDER — PRENATAL MULTIVITAMIN CH
1.0000 | ORAL_TABLET | Freq: Every day | ORAL | Status: DC
  Administered 2013-03-23 – 2013-03-29 (×6): 1 via ORAL
  Filled 2013-03-23 (×7): qty 1

## 2013-03-23 MED ORDER — DIPHENHYDRAMINE HCL 50 MG/ML IJ SOLN
12.5000 mg | Freq: Once | INTRAMUSCULAR | Status: AC
  Administered 2013-03-23: 12.5 mg via INTRAVENOUS
  Filled 2013-03-23: qty 1

## 2013-03-23 MED ORDER — PANTOPRAZOLE SODIUM 40 MG PO TBEC
40.0000 mg | DELAYED_RELEASE_TABLET | Freq: Every day | ORAL | Status: DC
  Administered 2013-03-24 – 2013-03-29 (×5): 40 mg via ORAL
  Filled 2013-03-23 (×6): qty 1

## 2013-03-23 MED ORDER — HYDROMORPHONE HCL PF 2 MG/ML IJ SOLN
2.0000 mg | INTRAMUSCULAR | Status: DC | PRN
  Administered 2013-03-23: 2 mg via INTRAVENOUS
  Filled 2013-03-23: qty 1

## 2013-03-23 NOTE — H&P (Signed)
[facility] History and Physical  [name]:[id number] DOB: January 01, 1991 DOA: 03/22/2013  Referring physician: EDP PCP: MATTHEWS,MICHELLE A., MD  Specialists:   Chief Complaint: Severe Pain in Left Leg and Left Elbow  HPI: [name] is a 22 y.o. female with Sickle Cell disease who presents to the ED with complaints of severe unremitting pain in her left leg and left elbow going into her shoulder since the AM.  She reports taking her home pain medication regimen without relief and she was advised to go to the ED.    She denies having any fevers or chills , she does reports having nausea and vomiting but no diarrhea, and she reports occasional chest pain.   She denies SOB.      Review of Systems: The patient denies anorexia, fever, chills, headaches, weight loss, vision loss, diplopia, dizziness, decreased hearing, rhinitis, hoarseness,syncope, dyspnea on exertion, peripheral edema, balance deficits, cough, hemoptysis, abdominal pain, diarrhea, constipation, hematemesis, melena, hematochezia, severe indigestion/heartburn, dysuria, hematuria, incontinence, muscle weakness, suspicious skin lesions, transient blindness, difficulty walking, depression, unusual weight change, abnormal bleeding, enlarged lymph nodes, angioedema, and breast masses.    Past Medical History  Diagnosis Date  . H/O: 1 miscarriage 03/22/2011  . 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 Depression 01/06/2011  . GERD (gastroesophageal reflux disease) 02/17/2011  . Trichotillomania     h/o  . Blood transfusion     "lots"  . Sickle cell anemia with crisis   . Exertional dyspnea     "sometimes"  . Sickle cell anemia   . Headache(784.0)   . Migraines 11/08/11    "@ least twice/month"  . Chronic back pain     "very severe; have knot in my back; from tight muscle; take RX and exercise for it"  . Mood swings 11/08/11    "I go back and forth; real bad"  . Pregnancy   . Blood dyscrasia     SICKLE CELL    Past  Surgical History  Procedure Laterality Date  . Cholecystectomy  05/2010  . Dilation and curettage of uterus  02/20/11    S/P miscarriage    Prior to Admission medications   Medication Sig Start Date End Date Taking? Authorizing Provider  folic acid (FOLVITE) 800 MCG tablet Take 2 tablets (1,600 mcg total) by mouth every morning. 02/26/13  Yes Altha Harm, MD  hydroxyurea (HYDREA) 500 MG capsule Take 2 capsules (1,000 mg total) by mouth at bedtime. May take with food to minimize GI side effects. 01/18/13  Yes Grayce Sessions, NP  ondansetron (ZOFRAN) 4 MG tablet Take 1 tablet (4 mg total) by mouth every 6 (six) hours. 03/08/13  Yes Fayrene Helper, PA-C  Oxycodone HCl 10 MG TABS Take medication on a TID PRN schedule of 15 mg (1.5 tabs), 10 mg, 15 mg (1.5 tabs). 03/18/13  Yes Altha Harm, MD  polyethylene glycol (MIRALAX / GLYCOLAX) packet Take 17 g by mouth daily as needed (for constipation).   Yes Historical Provider, MD  Prenatal Vit-Fe Fumarate-FA (PRENATAL MULTIVITAMIN) TABS tablet Take 1 tablet by mouth daily at 12 noon.   Yes Historical Provider, MD  sertraline (ZOLOFT) 50 MG tablet Take 1 tablet (50 mg total) by mouth daily. 02/26/13  Yes Altha Harm, MD    Allergies  Allergen Reactions  . Carrot Oil Hives and Swelling  . Latex Rash    Social History:  reports that she has been smoking Cigarettes.  She has a .25 pack-year smoking history. She  has never used smokeless tobacco. She reports that she drinks alcohol. She reports that she does not use illicit drugs.     Family History  Problem Relation Age of Onset  . Diabetes Mother   . Alcoholism    . Depression    . Hypercholesterolemia    . Hypertension    . Migraines    . Diabetes Maternal Grandmother   . Diabetes Paternal Grandmother     (be sure to complete)   Physical Exam:  GEN:  Pleasant  Tearful Overweight  22 y.o. African American female  examined  and in no acute distress; cooperative with  exam Filed Vitals:   03/22/13 2200 03/23/13 0414  BP: 118/68 111/71  Pulse: 107 93  Temp: 98.9 F (37.2 C)   TempSrc: Oral   Resp: 16 16  SpO2: 97% 95%   Blood pressure 111/71, pulse 93, temperature 98.9 F (37.2 C), temperature source Oral, resp. rate 16, last menstrual period 03/08/2013, SpO2 95.00%. PSYCH: SHe is alert and oriented x4; does not appear anxious does not appear depressed; affect is normal HEENT: Normocephalic and Atraumatic, Mucous membranes pink; PERRLA; EOM intact; Fundi:  Benign;  No scleral icterus, Nares: Patent, Oropharynx: Clear, Fair Dentition, Neck:  FROM, no cervical lymphadenopathy nor thyromegaly or carotid bruit; no JVD; Breasts:: Not examined CHEST WALL: No tenderness CHEST: Normal respiration, clear to auscultation bilaterally HEART: Regular rate and rhythm; no murmurs rubs or gallops BACK: No kyphosis or scoliosis; no CVA tenderness ABDOMEN: Positive Bowel Sounds, Obese, soft non-tender; no masses, no organomegaly, no pannus; no intertriginous candida. Rectal Exam: Not done EXTREMITIES: No cyanosis, clubbing or edema; no ulcerations. Genitalia: not examined PULSES: 2+ and symmetric SKIN: Normal hydration no rash or ulceration CNS: Cranial nerves 2-12 grossly intact no focal neurologic deficit    Labs on Admission:  Basic Metabolic Panel:  Recent Labs Lab 03/23/13 0041  NA 139  K 3.7  CL 103  GLUCOSE 91  BUN 6  CREATININE 0.70   Liver Function Tests: No results found for this basename: AST, ALT, ALKPHOS, BILITOT, PROT, ALBUMIN,  in the last 168 hours No results found for this basename: LIPASE, AMYLASE,  in the last 168 hours No results found for this basename: AMMONIA,  in the last 168 hours CBC:  Recent Labs Lab 03/23/13 0030 03/23/13 0041  WBC 20.6*  --   NEUTROABS 14.4*  --   HGB 8.7* 10.2*  HCT 25.2* 30.0*  MCV 90.0  --   PLT 481*  --    Cardiac Enzymes: No results found for this basename: CKTOTAL, CKMB, CKMBINDEX,  TROPONINI,  in the last 168 hours  BNP (last 3 results) No results found for this basename: PROBNP,  in the last 8760 hours CBG: No results found for this basename: GLUCAP,  in the last 168 hours  Radiological Exams on Admission: Dg Chest 2 View  03/22/2013   CLINICAL DATA:  Sickle cell crisis  EXAM: CHEST  2 VIEW  COMPARISON:  02/11/2013  FINDINGS: The heart size and mediastinal contours are within normal limits. Scarring identified in the right upper lobe. No pneumonia. Skeletal changes of sickle cell disease identified.  IMPRESSION: No active cardiopulmonary disease.   Electronically Signed   By: Signa Kell M.D.   On: 03/22/2013 23:43        Assessment/Plan Principal Problem:   SICKLE CELL CRISIS Active Problems:   GERD (gastroesophageal reflux disease)   Nausea & vomiting   Leukocytosis   Anemia  1.   Sickle Cell Crisis-  IV Dilaudid 2 3 hrs PRN Severe Pain,  Continue Oxycodone 10 mg PO q 6 Hours PRN.   Adjust Pain Rx PRN.    2.  Anemia- Monitor Hb level.     2.  Nausea and Vomiting- IV phenergan PRN.    3.  GERD-  Protonix. Daily.    4.  Leukocytosis-  Monitor trend.    5.  DVT prophylaxis with Lovenox.       Code Status:   FULL CODE  Family Communication:    No Family present Disposition Plan:       Observation  Time spent:  82 Minutes  Ron Parker [facility] Pager [phone]  If 7PM-7AM, please contact night-coverage [email or web address] Password [facility] 03/23/2013, 4:48 AM

## 2013-03-23 NOTE — ED Provider Notes (Signed)
Medical screening examination/treatment/procedure(s) were performed by non-physician practitioner and as supervising physician I was immediately available for consultation/collaboration.  EKG Interpretation     Ventricular Rate:    PR Interval:    QRS Duration:   QT Interval:    QTC Calculation:   R Axis:     Text Interpretation:                [name]. Marcy Sookdeo, MD 03/23/13 1035

## 2013-03-23 NOTE — ED Notes (Signed)
Pt was seen at [facility] and no relief pain.  Pt advised to come ED if symptoms worse.  Pt c/o left knee, left ankle pain.  Pt denies sob.

## 2013-03-23 NOTE — Progress Notes (Signed)
Patient seen, changed to a PCA pump. Pain mainly ib legs still poorly controlled. Patient states pain affects her ambulation.

## 2013-03-23 NOTE — Progress Notes (Deleted)
The patient is receiving Protonix by the intravenous route.  Based on criteria approved by the Pharmacy and Therapeutics Committee and the Medical Executive Committee, the medication is being converted to the equivalent oral dose form.  These criteria include: -No Active GI bleeding -Able to tolerate diet of full liquids (or better) or tube feeding -Able to tolerate other medications by the oral or enteral route  If you have any questions about this conversion, please contact the Pharmacy Department (phone [phone]).  Thank you.   Juliette Alcide, PharmD, BCPS.   Pager: [phone] 03/23/2013 9:06 AM

## 2013-03-24 DIAGNOSIS — D5700 Hb-SS disease with crisis, unspecified: Secondary | ICD-10-CM | POA: Diagnosis not present

## 2013-03-24 LAB — RETICULOCYTES
RBC.: 2.81 MIL/uL — ABNORMAL LOW (ref 3.87–5.11)
Retic Ct Pct: 20.5 % — ABNORMAL HIGH (ref 0.4–3.1)

## 2013-03-24 LAB — BASIC METABOLIC PANEL
CO2: 28 mEq/L (ref 19–32)
Calcium: 9 mg/dL (ref 8.4–10.5)
Chloride: 105 mEq/L (ref 96–112)
Creatinine, Ser: 0.64 mg/dL (ref 0.50–1.10)
GFR calc Af Amer: >90 mL/min (ref >90)
GFR calc non Af Amer: >90 mL/min (ref >90)
Glucose, Bld: 100 mg/dL — ABNORMAL HIGH (ref 70–99)

## 2013-03-24 LAB — CBC
MCH: 31.3 pg (ref 26.0–34.0)
MCHC: 34.8 g/dL (ref 30.0–36.0)
MCV: 90 fL (ref 78.0–100.0)
Platelets: 551 10*3/uL — ABNORMAL HIGH (ref 150–400)
RBC: 2.81 MIL/uL — ABNORMAL LOW (ref 3.87–5.11)

## 2013-03-24 LAB — LACTATE DEHYDROGENASE: LDH: 269 U/L — ABNORMAL HIGH (ref 94–250)

## 2013-03-24 MED ORDER — DIPHENHYDRAMINE HCL 50 MG/ML IJ SOLN
12.5000 mg | Freq: Four times a day (QID) | INTRAMUSCULAR | Status: DC | PRN
  Administered 2013-03-24 – 2013-03-25 (×4): 12.5 mg via INTRAVENOUS
  Filled 2013-03-24 (×3): qty 1

## 2013-03-24 MED ORDER — NALOXONE HCL 0.4 MG/ML IJ SOLN: 0.4000 mg | INTRAMUSCULAR | Status: DC | PRN

## 2013-03-24 MED ORDER — SODIUM CHLORIDE 0.9 % IV SOLN
25.0000 mg | INTRAVENOUS | Status: DC | PRN
  Filled 2013-03-24: qty 0.5

## 2013-03-24 MED ORDER — ONDANSETRON HCL 4 MG/2ML IJ SOLN
4.0000 mg | Freq: Four times a day (QID) | INTRAMUSCULAR | Status: DC | PRN
  Filled 2013-03-24: qty 2

## 2013-03-24 MED ORDER — MORPHINE SULFATE 4 MG/ML IJ SOLN
4.0000 mg | Freq: Once | INTRAMUSCULAR | Status: AC
  Administered 2013-03-24: 4 mg via INTRAVENOUS
  Filled 2013-03-24: qty 1

## 2013-03-24 MED ORDER — SODIUM CHLORIDE 0.9 % IJ SOLN: 9.0000 mL | INTRAMUSCULAR | Status: DC | PRN

## 2013-03-24 MED ORDER — HYDROMORPHONE HCL PF 2 MG/ML IJ SOLN
2.0000 mg | INTRAMUSCULAR | Status: DC | PRN
  Administered 2013-03-24 – 2013-03-25 (×3): 2 mg via INTRAVENOUS
  Filled 2013-03-24 (×3): qty 1

## 2013-03-24 MED ORDER — ONDANSETRON HCL 4 MG/2ML IJ SOLN
4.0000 mg | Freq: Four times a day (QID) | INTRAMUSCULAR | Status: DC | PRN
  Administered 2013-03-26 (×2): 4 mg via INTRAVENOUS
  Filled 2013-03-24 (×2): qty 2

## 2013-03-24 MED ORDER — MORPHINE SULFATE (PF) 1 MG/ML IV SOLN
INTRAVENOUS | Status: DC
  Administered 2013-03-24: 21:00:00 via INTRAVENOUS
  Administered 2013-03-24: 12 mg via INTRAVENOUS
  Administered 2013-03-24: 18:00:00 via INTRAVENOUS
  Filled 2013-03-24 (×2): qty 25

## 2013-03-24 MED ORDER — HYDROMORPHONE HCL PF 2 MG/ML IJ SOLN
2.0000 mg | INTRAMUSCULAR | Status: DC | PRN
  Administered 2013-03-24 (×3): 2 mg via INTRAVENOUS
  Filled 2013-03-24 (×3): qty 1

## 2013-03-24 MED ORDER — HYDROMORPHONE 0.3 MG/ML IV SOLN
INTRAVENOUS | Status: DC
  Administered 2013-03-24: 23:00:00 via INTRAVENOUS
  Administered 2013-03-25: 1.5 mg via INTRAVENOUS
  Filled 2013-03-24: qty 25

## 2013-03-24 MED ORDER — DIPHENHYDRAMINE HCL 12.5 MG/5ML PO ELIX: 12.5000 mg | ORAL_SOLUTION | Freq: Four times a day (QID) | ORAL | Status: DC | PRN

## 2013-03-24 MED ORDER — ONDANSETRON HCL 4 MG PO TABS
4.0000 mg | ORAL_TABLET | Freq: Four times a day (QID) | ORAL | Status: DC | PRN
  Administered 2013-03-28: 4 mg via ORAL
  Filled 2013-03-24: qty 1

## 2013-03-24 NOTE — Progress Notes (Signed)
Pt complaining that she is in severe pain at this time with no relief from the current medication regimen. Pt was switched to Morphine PCA during the previous shift as it was reported that she did not have good pain control and was itching with the diluadid. Pt states that her pain was better with the diluadid and she wants to be switch back to the previous medication. Pt was drowsy and going in and out of sleep during the assessment at the beginning of my shift. She does remain awake and alert at this time. Pt states that "just because she is asleep doesn't mean that I am not in pain". These concerns are being shared with the PA on call and at this time we will not being changing her current medications per the on call PA. Will continue to monitor pt's pain.

## 2013-03-24 NOTE — Progress Notes (Signed)
Subjective: Patient seen, states pain in legs improved from 10/10 to 8/10. Patient states she was having difficulty walking yesterday but she was able to ambulate better today. Patient has used very little from her PCA in the last 24hrs. She used 2.1mg  total and made 7 demands and had 6 deliveries. Patient states she was tired therefore slept and hardly used PCA.  Objective: Weight change:   Intake/Output Summary (Last 24 hours) at 03/24/13 1459 Last data filed at 03/24/13 1421  Gross per 24 hour  Intake   1611 ml  Output      0 ml  Net   1611 ml   BP 112/61  Pulse 98  Temp(Src) 98.3 F (36.8 C) (Oral)  Resp 18  Ht 5' 2.5" (1.588 m)  Wt 81 kg (178 lb 9.2 oz)  BMI 32.12 kg/m2  SpO2 98%  LMP 03/08/2013  General Appearance:    Alert, cooperative, no distress, appears stated age  Head:    Normocephalic, without obvious abnormality, atraumatic  Eyes:    PERRL, conjunctiva/corneas clear     Nose:   Nares normal, septum midline, mucosa normal, no drainage    or sinus tenderness  Throat:   Lips, mucosa, and tongue normal; teeth and gums normal  Neck:   Supple, symmetrical, trachea midline, no adenopathy;    thyroid:  no enlargement/tenderness/nodules; no carotid   bruit or JVD  Back:     Symmetric, no curvature, ROM normal, no CVA tenderness  Lungs:     Clear to auscultation bilaterally, respirations unlabored  Chest Wall:    No tenderness or deformity   Heart:    Regular rate and rhythm, S1 and S2 normal, no murmur, rub   or gallop     Abdomen:     Soft, non-tender, bowel sounds active all four quadrants,    no masses, no organomegaly        Extremities:   Extremities normal, atraumatic, no cyanosis or edema, legs with some tenderness to touch.  Pulses:   2+ and symmetric all extremities  Skin:   Skin color, texture, turgor normal, no rashes or lesions  Lymph nodes:   Cervical, supraclavicular, and axillary nodes normal  Neurologic:   CNII-XII intact, normal strength, sensation  and reflexes    throughout    Lab Results:  Recent Labs  03/23/13 0500 03/24/13 0554  NA 136 139  K 3.5 4.5  CL 102 105  CO2 24 28  GLUCOSE 99 100*  BUN 7 8  CREATININE 0.58 0.64  CALCIUM 9.2 9.0  MG 1.9  --     Recent Labs  03/23/13 0500  AST 19  ALT 15  ALKPHOS 58  BILITOT 2.6*  PROT 7.0  ALBUMIN 4.0   No results found for this basename: LIPASE, AMYLASE,  in the last 72 hours  Recent Labs  03/23/13 0030  03/23/13 0500 03/24/13 0554  WBC 20.6*  --  18.7* 14.2*  NEUTROABS 14.4*  --  12.3*  --   HGB 8.7*  < > 8.5* 8.8*  HCT 25.2*  < > 24.2* 25.3*  MCV 90.0  --  90.0 90.0  PLT 481*  --  463* 551*  < > = values in this interval not displayed. No results found for this basename: CKTOTAL, CKMB, CKMBINDEX, TROPONINI,  in the last 72 hours No components found with this basename: POCBNP,  No results found for this basename: DDIMER,  in the last 72 hours No results found for this basename: HGBA1C,  in the last 72 hours No results found for this basename: CHOL, HDL, LDLCALC, TRIG, CHOLHDL, LDLDIRECT,  in the last 72 hours No results found for this basename: TSH, T4TOTAL, FREET3, T3FREE, THYROIDAB,  in the last 72 hours  Recent Labs  03/23/13 0030 03/24/13 0554  RETICCTPCT 19.3* 20.5*    Micro Results: No results found for this or any previous visit (from the past 240 hour(s)).  Studies/Results: Dg Chest 2 View  03/22/2013   CLINICAL DATA:  Sickle cell crisis  EXAM: CHEST  2 VIEW  COMPARISON:  02/11/2013  FINDINGS: The heart size and mediastinal contours are within normal limits. Scarring identified in the right upper lobe. No pneumonia. Skeletal changes of sickle cell disease identified.  IMPRESSION: No active cardiopulmonary disease.   Electronically Signed   By: Signa Kell M.D.   On: 03/22/2013 23:43   Medications: Scheduled Meds: . enoxaparin (LOVENOX) injection  40 mg Subcutaneous Q24H  . folic acid  1 mg Oral Daily  . HYDROmorphone PCA 0.3 mg/mL    Intravenous Q4H  . hydroxyurea  1,000 mg Oral QHS  . ketorolac  15 mg Intravenous Q6H  . pantoprazole  40 mg Oral Daily   Or  . pantoprazole (PROTONIX) IV  40 mg Intravenous Daily  . prenatal multivitamin  1 tablet Oral Q1200  . sertraline  50 mg Oral Daily   Continuous Infusions:  PRN Meds:.bisacodyl, diphenhydrAMINE, diphenhydrAMINE, HYDROmorphone (DILAUDID) injection, naloxone, ondansetron (ZOFRAN) IV, ondansetron, polyethylene glycol, sodium chloride  Assessment/Plan: @PROBHOSP @  LOS: 2 days    Sickle cell pain crisis: Patient placed on regular dosing PCA pump especially as her pain is improved while not using PCA. PRN dilaudid ordered, will change to PO oxycodone in PM. D/c IVF hydration, continue toradol and K pad Anemia: stable at baseline Leukocytosis: chronic, no evidence of infection Depression: continue zoloft Trichotillomania: stable   Code Status: full code Dispo: Hopefully home in AM  [name] 03/24/2013, 2:59 PM

## 2013-03-24 NOTE — Progress Notes (Signed)
Pt screaming out in pain.  Dilaudid  Given for breakthrough pain. Scratching excessively and thrashing around in bed. Prior to this episode pt was returning to bed after coming bathroom. 1645... Call placed to MD. Orders recd and and will carry out.    PCA changed from Dilaudid to Morphine... 4mg  of Morphine given and 25 mg of bendryl

## 2013-03-24 NOTE — Progress Notes (Signed)
Pt still complaining that Morphine is not relieving her pain. Charge RN has spoken with the patient and patient has contacted her case Production designer, theatre/television/film. Case manager has advised pt to speak with the on call PA face to face and at this time PA is paged to round on pt. Rendall, PA agreeable to see patient as soon as possible.

## 2013-03-25 ENCOUNTER — Inpatient Hospital Stay (HOSPITAL_COMMUNITY): Payer: Medicare Other

## 2013-03-25 DIAGNOSIS — D5700 Hb-SS disease with crisis, unspecified: Secondary | ICD-10-CM | POA: Diagnosis not present

## 2013-03-25 DIAGNOSIS — M549 Dorsalgia, unspecified: Secondary | ICD-10-CM

## 2013-03-25 DIAGNOSIS — G8929 Other chronic pain: Secondary | ICD-10-CM

## 2013-03-25 DIAGNOSIS — D649 Anemia, unspecified: Secondary | ICD-10-CM | POA: Diagnosis not present

## 2013-03-25 LAB — CBC
HCT: 23.2 % — ABNORMAL LOW (ref 36.0–46.0)
MCH: 31.8 pg (ref 26.0–34.0)
MCHC: 35.3 g/dL (ref 30.0–36.0)
MCV: 89.9 fL (ref 78.0–100.0)
RBC: 2.58 MIL/uL — ABNORMAL LOW (ref 3.87–5.11)
WBC: 16.4 10*3/uL — ABNORMAL HIGH (ref 4.0–10.5)

## 2013-03-25 LAB — BASIC METABOLIC PANEL
BUN: 11 mg/dL (ref 6–23)
CO2: 27 mEq/L (ref 19–32)
Calcium: 9.5 mg/dL (ref 8.4–10.5)
Chloride: 104 mEq/L (ref 96–112)
Creatinine, Ser: 0.73 mg/dL (ref 0.50–1.10)
Sodium: 140 mEq/L (ref 135–145)

## 2013-03-25 LAB — LACTATE DEHYDROGENASE: LDH: 297 U/L — ABNORMAL HIGH (ref 94–250)

## 2013-03-25 MED ORDER — HYDROMORPHONE 0.3 MG/ML IV SOLN
INTRAVENOUS | Status: AC
  Administered 2013-03-25: 23:00:00 via INTRAVENOUS
  Administered 2013-03-25: 6.39 mg via INTRAVENOUS
  Administered 2013-03-25: 3.6 mg via INTRAVENOUS
  Administered 2013-03-25: 5.59 mg via INTRAVENOUS
  Administered 2013-03-25 – 2013-03-26 (×4): via INTRAVENOUS
  Filled 2013-03-25 (×6): qty 25

## 2013-03-25 NOTE — Procedures (Signed)
Successful RUE DL POWER PICC  TIP SVC/RA NO COMP STABLE

## 2013-03-25 NOTE — Progress Notes (Signed)
Subjective: A 22 year old female admitted with sickle cell painful crisis. Patient has been on Dilaudid PCA since this morning. She had an issue with pain medication yesterday which has been resolved. Her pain was at 7/10 on full dose Dilaudid PCA. Denies shortness of breath, no cough, no fever, no nausea vomiting or diarrhea. She wants to have Dilaudid push for breakthrough pain.   Objective: Vital signs in last 24 hours: Temp:  [98.1 F (36.7 C)-98.5 F (36.9 C)] 98.2 F (36.8 C) (11/10 1345) Pulse Rate:  [91-116] 116 (11/10 1345) Resp:  [11-22] 18 (11/10 1558) BP: (105-139)/(55-81) 139/81 mmHg (11/10 1345) SpO2:  [94 %-100 %] 99 % (11/10 1558) Weight change:  Last BM Date: 03/20/13  Intake/Output from previous day: 11/09 0701 - 11/10 0700 In: 720 [P.O.:480] Out: -  Intake/Output this shift: Total I/O In: 600 [P.O.:600] Out: -   General appearance: alert, cooperative and no distress Eyes: conjunctivae/corneas clear. PERRL, EOM's intact. Fundi benign. Neck: no adenopathy, no carotid bruit, no JVD, supple, symmetrical, trachea midline and thyroid not enlarged, symmetric, no tenderness/mass/nodules Back: symmetric, no curvature. ROM normal. No CVA tenderness. Resp: clear to auscultation bilaterally Cardio: regular rate and rhythm, S1, S2 normal, no murmur, click, rub or gallop GI: soft, non-tender; bowel sounds normal; no masses,  no organomegaly Extremities: extremities normal, atraumatic, no cyanosis or edema Pulses: 2+ and symmetric Skin: Skin color, texture, turgor normal. No rashes or lesions Neurologic: Grossly normal  Lab Results:  Recent Labs  03/24/13 0554 03/25/13 0535  WBC 14.2* 16.4*  HGB 8.8* 8.2*  HCT 25.3* 23.2*  PLT 551* 473*   BMET  Recent Labs  03/24/13 0554 03/25/13 0535  NA 139 140  K 4.5 4.3  CL 105 104  CO2 28 27  GLUCOSE 100* 95  BUN 8 11  CREATININE 0.64 0.73  CALCIUM 9.0 9.5    Studies/Results: Ir US Guide Vasc Access  Right  03/25/2013   CLINICAL DATA:  Sickle cell crisis, pain  EXAM: RIGHT UPPER EXTREMITY PICC LINE PLACEMENT WITH ULTRASOUND AND FLUOROSCOPIC GUIDANCE  FLUOROSCOPY TIME:  12 seconds  PROCEDURE: The patient was advised of the possible risks and complications and agreed to undergo the procedure. The patient was then brought to the angiographic suite for the procedure.  The right arm was prepped with chlorhexidine, draped in the usual sterile fashion using maximum barrier technique (cap and mask, sterile gown, sterile gloves, large sterile sheet, hand hygiene and cutaneous antisepsis) and infiltrated locally with 1% Lidocaine.  Ultrasound demonstrated patency of the right basilic vein, and this was documented with an image. Under real-time ultrasound guidance, this vein was accessed with a 21 gauge micropuncture needle and image documentation was performed. A 0.018 wire was introduced in to the vein. Over this, a 5 [place] double lumen power injectable PICC was advanced to the lower SVC/right atrial junction. Fluoroscopy during the procedure and fluoro spot radiograph confirms appropriate catheter position. The catheter was flushed and covered with a sterile dressing.  Complications: No immediate  IMPRESSION: Successful right arm power PICC line placement with ultrasound and fluoroscopic guidance. The catheter is ready for use.   Electronically Signed   By: Ruel Favors M.D.   On: 03/25/2013 12:01   Ir Fluoro Guide Cv Midline Picc Right  03/25/2013   CLINICAL DATA:  Sickle cell crisis, pain  EXAM: RIGHT UPPER EXTREMITY PICC LINE PLACEMENT WITH ULTRASOUND AND FLUOROSCOPIC GUIDANCE  FLUOROSCOPY TIME:  12 seconds  PROCEDURE: The patient was advised of the possible  risks and complications and agreed to undergo the procedure. The patient was then brought to the angiographic suite for the procedure.  The right arm was prepped with chlorhexidine, draped in the usual sterile fashion using maximum barrier technique (cap  and mask, sterile gown, sterile gloves, large sterile sheet, hand hygiene and cutaneous antisepsis) and infiltrated locally with 1% Lidocaine.  Ultrasound demonstrated patency of the right basilic vein, and this was documented with an image. Under real-time ultrasound guidance, this vein was accessed with a 21 gauge micropuncture needle and image documentation was performed. A 0.018 wire was introduced in to the vein. Over this, a 5 [place] double lumen power injectable PICC was advanced to the lower SVC/right atrial junction. Fluoroscopy during the procedure and fluoro spot radiograph confirms appropriate catheter position. The catheter was flushed and covered with a sterile dressing.  Complications: No immediate  IMPRESSION: Successful right arm power PICC line placement with ultrasound and fluoroscopic guidance. The catheter is ready for use.   Electronically Signed   By: Ruel Favors M.D.   On: 03/25/2013 12:01    Medications: I have reviewed the patient's current medications.  Assessment/Plan:  #1 sickle cell painful crisis: patient on full dose PCA at the moment. She does not require Dilaudid pushes as she is asking for. I have explained to her that she needs to use the Dilaudid PCA as prescribed. She has only used about 8 mg in the last 12 hours. She claims that she is trying to get used to the PCA basilar she was to use the IV pushes. I have made clear to her that the best way to get used to it is to use the PCA exclusively. We'll continue with Toradol and supportive care.  #2 GERD: Seem to be stable on PPI  #3 sickle cell anemia: H&H seems to be stable.  #4 leukocytosis: G2 vaso-occlusive disease. We will continue to monitor.  #5 depression anxiety: Counseling provided again.  LOS: 3 days   Joda Braatz,LAWAL 03/25/2013, 6:47 PM

## 2013-03-26 DIAGNOSIS — D5700 Hb-SS disease with crisis, unspecified: Secondary | ICD-10-CM | POA: Diagnosis not present

## 2013-03-26 DIAGNOSIS — K5900 Constipation, unspecified: Secondary | ICD-10-CM

## 2013-03-26 DIAGNOSIS — D649 Anemia, unspecified: Secondary | ICD-10-CM

## 2013-03-26 DIAGNOSIS — D571 Sickle-cell disease without crisis: Secondary | ICD-10-CM | POA: Diagnosis not present

## 2013-03-26 LAB — CBC WITH DIFFERENTIAL/PLATELET
Basophils Relative: 0 % (ref 0–1)
Eosinophils Absolute: 0.8 10*3/uL — ABNORMAL HIGH (ref 0.0–0.7)
HCT: 20.2 % — ABNORMAL LOW (ref 36.0–46.0)
Hemoglobin: 7.3 g/dL — ABNORMAL LOW (ref 12.0–15.0)
MCH: 32 pg (ref 26.0–34.0)
MCHC: 36.1 g/dL — ABNORMAL HIGH (ref 30.0–36.0)
Monocytes Absolute: 1.6 10*3/uL — ABNORMAL HIGH (ref 0.1–1.0)
Monocytes Relative: 9 % (ref 3–12)
Neutrophils Relative %: 76 % (ref 43–77)
RDW: 16.9 % — ABNORMAL HIGH (ref 11.5–15.5)
WBC: 17.7 10*3/uL — ABNORMAL HIGH (ref 4.0–10.5)

## 2013-03-26 LAB — COMPREHENSIVE METABOLIC PANEL
Albumin: 4 g/dL (ref 3.5–5.2)
Alkaline Phosphatase: 65 U/L (ref 39–117)
BUN: 13 mg/dL (ref 6–23)
Calcium: 9.3 mg/dL (ref 8.4–10.5)
Chloride: 101 mEq/L (ref 96–112)
Creatinine, Ser: 0.72 mg/dL (ref 0.50–1.10)
GFR calc non Af Amer: >90 mL/min (ref >90)
Glucose, Bld: 113 mg/dL — ABNORMAL HIGH (ref 70–99)
Sodium: 137 mEq/L (ref 135–145)
Total Protein: 7.1 g/dL (ref 6.0–8.3)

## 2013-03-26 MED ORDER — HYDROMORPHONE 0.3 MG/ML IV SOLN
INTRAVENOUS | Status: DC
  Administered 2013-03-26: 4.81 mg via INTRAVENOUS
  Administered 2013-03-26: 22:00:00 via INTRAVENOUS
  Administered 2013-03-26: 3.42 mg via INTRAVENOUS
  Administered 2013-03-27: 13:00:00 via INTRAVENOUS
  Administered 2013-03-27: 10 mg via INTRAVENOUS
  Administered 2013-03-27: 05:00:00 via INTRAVENOUS
  Administered 2013-03-27: 25 mL via INTRAVENOUS
  Administered 2013-03-27: 5.39 mg via INTRAVENOUS
  Administered 2013-03-28: 1.64 mg via INTRAVENOUS
  Administered 2013-03-28: 3.49 mg via INTRAVENOUS
  Administered 2013-03-28 (×2): via INTRAVENOUS
  Administered 2013-03-28: 1.65 mg via INTRAVENOUS
  Administered 2013-03-28: 5.49 mg via INTRAVENOUS
  Administered 2013-03-29: 4.99 mg via INTRAVENOUS
  Filled 2013-03-26 (×6): qty 25

## 2013-03-26 MED ORDER — DIPHENHYDRAMINE HCL 50 MG/ML IJ SOLN: 12.5000 mg | Freq: Four times a day (QID) | INTRAMUSCULAR | Status: DC | PRN

## 2013-03-26 MED ORDER — HYDROMORPHONE 0.3 MG/ML IV SOLN: INTRAVENOUS | Status: DC

## 2013-03-26 MED ORDER — NALOXONE HCL 0.4 MG/ML IJ SOLN: 0.4000 mg | INTRAMUSCULAR | Status: DC | PRN

## 2013-03-26 MED ORDER — ASPIRIN-ACETAMINOPHEN-CAFFEINE 250-250-65 MG PO TABS
1.0000 | ORAL_TABLET | Freq: Four times a day (QID) | ORAL | Status: DC | PRN
  Filled 2013-03-26 (×2): qty 1

## 2013-03-26 MED ORDER — SODIUM CHLORIDE 0.9 % IJ SOLN
10.0000 mL | INTRAMUSCULAR | Status: DC | PRN
  Administered 2013-03-28 – 2013-03-29 (×2): 10 mL

## 2013-03-26 MED ORDER — SODIUM CHLORIDE 0.9 % IJ SOLN: 9.0000 mL | INTRAMUSCULAR | Status: DC | PRN

## 2013-03-26 MED ORDER — SODIUM CHLORIDE 0.45 % IV SOLN
INTRAVENOUS | Status: DC
Start: 2013-03-26 — End: 2013-03-29
  Administered 2013-03-26 – 2013-03-27 (×2): via INTRAVENOUS
  Administered 2013-03-27: 100 mL/h via INTRAVENOUS
  Administered 2013-03-28: 23:00:00 via INTRAVENOUS

## 2013-03-26 MED ORDER — ONDANSETRON HCL 4 MG/2ML IJ SOLN: 4.0000 mg | Freq: Four times a day (QID) | INTRAMUSCULAR | Status: DC | PRN

## 2013-03-26 MED ORDER — SENNOSIDES-DOCUSATE SODIUM 8.6-50 MG PO TABS
1.0000 | ORAL_TABLET | Freq: Two times a day (BID) | ORAL | Status: DC
  Administered 2013-03-26 – 2013-03-29 (×6): 1 via ORAL
  Filled 2013-03-26 (×8): qty 1

## 2013-03-26 MED ORDER — ACETAMINOPHEN 325 MG PO TABS: 650.0000 mg | ORAL_TABLET | Freq: Four times a day (QID) | ORAL | Status: DC | PRN

## 2013-03-26 MED ORDER — DIPHENHYDRAMINE HCL 12.5 MG/5ML PO ELIX: 12.5000 mg | ORAL_SOLUTION | Freq: Four times a day (QID) | ORAL | Status: DC | PRN

## 2013-03-26 NOTE — Progress Notes (Signed)
Pt complains of a migraine headache. Pt states she has migraines and home and takes Excedrin or Tylenol. KOdessa [name] called and notified of complaints. Orders received

## 2013-03-26 NOTE — Progress Notes (Signed)
Patient ID: [name], female   DOB: 05-20-1990, 22 y.o.   MRN: [id number] Pt has used 24 mg of dilaudid in 6 hours. Nurse reports that patient sedated. She has also been having nausea and vomiting. Will increase fluids to 100 ml/hr. I will also change her PCa at night to give a continuous dose of 0.5 mg /hr  to allow for rest. Her bolus dose has been decreased to 0.5 mg  And her 4 hour lockout is 10 mg. Will re-assess in the morning.

## 2013-03-26 NOTE — Progress Notes (Signed)
Pt called to assess her PICC line; upon entering the room, patient asleep up against the bed rail, would not respond until touched; placed an occlusive dressing over PICC, called IV team; educated patient not to mess with dressing or PICC line, that this is a sterile area. Pt still falling asleep unable to stay awake for any length of conversation; will continue to monitor for patient safety

## 2013-03-26 NOTE — Progress Notes (Signed)
SICKLE CELL SERVICE PROGRESS NOTE  [name] JXB:[id number] DOB: [birth year] DOA: 03/22/2013 PCP: Laportia Carley A., MD  Assessment/Plan: Principal Problem:   SICKLE CELL CRISIS Active Problems:   Anemia   GERD (gastroesophageal reflux disease)   Nausea & vomiting   Leukocytosis  1. Hb SS with crisis: Pt states that unlike the last 2 days, she was able to do some walking around the room today. She has used 70 mg of Dilaudid in the last 22 hours and has had 40 demands with 37 deliveries. She states that she has still been having her sleep interrupted at night to use the PCA. Pt still not using her PCA maximally. I will leave PCA at current dosing and encourage increased use. Continue Toradol. I will consider a continuous basal dose overnight to allow patient to rest. Will re-evaluate in 4 hours and make that decision. 2. Anemia: Pt with evidence of acute hemolysis leading to an acute anemia. Pt will likely require a transfusion of 1 unit of PRBC's. Will re-check Hb tomorrow. 3. 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 4. Constipation: Senna-S and Miralax ordered. Pt has not had a BM in 3 days.  Code Status: Full Code Family Communication: N/A Disposition Plan: Home in 48-772 hours  [name] A.  Pager [phone]. If 7PM-7AM, please contact night-coverage.  03/26/2013, 11:16 AM  LOS: 4 days   Brief narrative: [name] is a 22 y.o. female with Sickle Cell disease who presents to the ED with complaints of severe unremitting pain in her left leg and left elbow going into her shoulder since the AM. She reports taking her home pain medication regimen without relief and she was advised to go to the ED. She denies having any fevers or chills , she does reports having nausea and vomiting but no diarrhea, and she reports occasional chest pain. She denies SOB.     Consultants:  None  Procedures:  None  Antibiotics:  None  HPI/Subjective: Pt states that unlike the last 2 days, she was able to do some walking around the room today. She rates her pain as 6/10. She feels that her current dose of short acting medication has become insufficient since the change in weather.  Patient has not had a BM in 3 days Objective: Filed Vitals:   03/26/13 0212 03/26/13 0344 03/26/13 0503 03/26/13 0800  BP:   134/79   Pulse:   105   Temp:   97.8 F (36.6 C)   TempSrc:   Axillary   Resp: 14 12 14 14   Height:      Weight:      SpO2: 96% 92% 97% 97%   Weight change:   Intake/Output Summary (Last 24 hours) at 03/26/13 1116 Last data filed at 03/26/13 0900  Gross per 24 hour  Intake 1269.2 ml  Output      0 ml  Net 1269.2 ml    General: Alert, awake, oriented x3, in no acute distress.  HEENT: [place]/AT PEERL, EOMI, mild icterus Heart: Regular rate and rhythm, without murmurs, rubs, gallops.  Lungs: Clear to auscultation, no wheezing or rhonchi noted. Abdomen: Soft, nontender, nondistended, positive bowel sounds, no masses no hepatosplenomegaly noted.  Neuro: No focal neurological deficits noted cranial nerves II through XII grossly intact.  Musculoskeletal: No warm swelling or erythema around joints, no spinal tenderness noted. Psychiatric: Patient alert and oriented x3, good insight and cognition, good recent to remote recall.    Data Reviewed: Basic  Metabolic Panel:  Recent Labs Lab 03/23/13 0041 03/23/13 0500 03/24/13 0554 03/25/13 0535 03/26/13 0500  NA 139 136 139 140 137  K 3.7 3.5 4.5 4.3 4.4  CL 103 102 105 104 101  CO2  --  24 28 27 30   GLUCOSE 91 99 100* 95 113*  BUN 6 7 8 11 13   CREATININE 0.70 0.58 0.64 0.73 0.72  CALCIUM  --  9.2 9.0 9.5 9.3  MG  --  1.9  --   --   --    Liver Function Tests:  Recent Labs Lab 03/23/13 0500 03/26/13 0500  AST 19 25  ALT 15 16  ALKPHOS 58 65  BILITOT 2.6* 4.1*  PROT 7.0 7.1   ALBUMIN 4.0 4.0   No results found for this basename: LIPASE, AMYLASE,  in the last 168 hours No results found for this basename: AMMONIA,  in the last 168 hours CBC:  Recent Labs Lab 03/23/13 0030 03/23/13 0041 03/23/13 0500 03/24/13 0554 03/25/13 0535 03/26/13 0500  WBC 20.6*  --  18.7* 14.2* 16.4* 17.7*  NEUTROABS 14.4*  --  12.3*  --   --  13.5*  HGB 8.7* 10.2* 8.5* 8.8* 8.2* 7.3*  HCT 25.2* 30.0* 24.2* 25.3* 23.2* 20.2*  MCV 90.0  --  90.0 90.0 89.9 88.6  PLT 481*  --  463* 551* 473* 461*   Cardiac Enzymes: No results found for this basename: CKTOTAL, CKMB, CKMBINDEX, TROPONINI,  in the last 168 hours BNP (last 3 results) No results found for this basename: PROBNP,  in the last 8760 hours CBG: No results found for this basename: GLUCAP,  in the last 168 hours  No results found for this or any previous visit (from the past 240 hour(s)).   Studies: Dg Chest 2 View  03/22/2013   CLINICAL DATA:  Sickle cell crisis  EXAM: CHEST  2 VIEW  COMPARISON:  02/11/2013  FINDINGS: The heart size and mediastinal contours are within normal limits. Scarring identified in the right upper lobe. No pneumonia. Skeletal changes of sickle cell disease identified.  IMPRESSION: No active cardiopulmonary disease.   Electronically Signed   By: Signa Kell M.D.   On: 03/22/2013 23:43   Ir US Guide Vasc Access Right  03/25/2013   CLINICAL DATA:  Sickle cell crisis, pain  EXAM: RIGHT UPPER EXTREMITY PICC LINE PLACEMENT WITH ULTRASOUND AND FLUOROSCOPIC GUIDANCE  FLUOROSCOPY TIME:  12 seconds  PROCEDURE: The patient was advised of the possible risks and complications and agreed to undergo the procedure. The patient was then brought to the angiographic suite for the procedure.  The right arm was prepped with chlorhexidine, draped in the usual sterile fashion using maximum barrier technique (cap and mask, sterile gown, sterile gloves, large sterile sheet, hand hygiene and cutaneous antisepsis) and  infiltrated locally with 1% Lidocaine.  Ultrasound demonstrated patency of the right basilic vein, and this was documented with an image. Under real-time ultrasound guidance, this vein was accessed with a 21 gauge micropuncture needle and image documentation was performed. A 0.018 wire was introduced in to the vein. Over this, a 5 [place] double lumen power injectable PICC was advanced to the lower SVC/right atrial junction. Fluoroscopy during the procedure and fluoro spot radiograph confirms appropriate catheter position. The catheter was flushed and covered with a sterile dressing.  Complications: No immediate  IMPRESSION: Successful right arm power PICC line placement with ultrasound and fluoroscopic guidance. The catheter is ready for use.   Electronically Signed  By: Ruel Favors M.D.   On: 03/25/2013 12:01   Ir Fluoro Guide Cv Midline Picc Right  03/25/2013   CLINICAL DATA:  Sickle cell crisis, pain  EXAM: RIGHT UPPER EXTREMITY PICC LINE PLACEMENT WITH ULTRASOUND AND FLUOROSCOPIC GUIDANCE  FLUOROSCOPY TIME:  12 seconds  PROCEDURE: The patient was advised of the possible risks and complications and agreed to undergo the procedure. The patient was then brought to the angiographic suite for the procedure.  The right arm was prepped with chlorhexidine, draped in the usual sterile fashion using maximum barrier technique (cap and mask, sterile gown, sterile gloves, large sterile sheet, hand hygiene and cutaneous antisepsis) and infiltrated locally with 1% Lidocaine.  Ultrasound demonstrated patency of the right basilic vein, and this was documented with an image. Under real-time ultrasound guidance, this vein was accessed with a 21 gauge micropuncture needle and image documentation was performed. A 0.018 wire was introduced in to the vein. Over this, a 5 [place] double lumen power injectable PICC was advanced to the lower SVC/right atrial junction. Fluoroscopy during the procedure and fluoro spot radiograph  confirms appropriate catheter position. The catheter was flushed and covered with a sterile dressing.  Complications: No immediate  IMPRESSION: Successful right arm power PICC line placement with ultrasound and fluoroscopic guidance. The catheter is ready for use.   Electronically Signed   By: Ruel Favors M.D.   On: 03/25/2013 12:01    Scheduled Meds: . enoxaparin (LOVENOX) injection  40 mg Subcutaneous Q24H  . folic acid  1 mg Oral Daily  . HYDROmorphone PCA 0.3 mg/mL   Intravenous Q4H  . hydroxyurea  1,000 mg Oral QHS  . ketorolac  15 mg Intravenous Q6H  . pantoprazole  40 mg Oral Daily   Or  . pantoprazole (PROTONIX) IV  40 mg Intravenous Daily  . prenatal multivitamin  1 tablet Oral Q1200  . senna-docusate  1 tablet Oral BID  . sertraline  50 mg Oral Daily   Total time 40 minutes.

## 2013-03-27 DIAGNOSIS — D5700 Hb-SS disease with crisis, unspecified: Secondary | ICD-10-CM | POA: Diagnosis not present

## 2013-03-27 DIAGNOSIS — D72829 Elevated white blood cell count, unspecified: Secondary | ICD-10-CM | POA: Diagnosis not present

## 2013-03-27 DIAGNOSIS — K219 Gastro-esophageal reflux disease without esophagitis: Secondary | ICD-10-CM | POA: Diagnosis not present

## 2013-03-27 LAB — CBC WITH DIFFERENTIAL/PLATELET
Eosinophils Absolute: 0.3 10*3/uL (ref 0.0–0.7)
Hemoglobin: 6.8 g/dL — CL (ref 12.0–15.0)
Lymphocytes Relative: 17 % (ref 12–46)
Lymphs Abs: 3.6 10*3/uL (ref 0.7–4.0)
MCH: 31.3 pg (ref 26.0–34.0)
MCV: 88.5 fL (ref 78.0–100.0)
Monocytes Relative: 8 % (ref 3–12)
Neutrophils Relative %: 73 % (ref 43–77)
RBC: 2.17 MIL/uL — ABNORMAL LOW (ref 3.87–5.11)
WBC: 21 10*3/uL — ABNORMAL HIGH (ref 4.0–10.5)

## 2013-03-27 MED ORDER — HYDROMORPHONE 0.3 MG/ML IV SOLN: INTRAVENOUS | Status: DC

## 2013-03-27 NOTE — Progress Notes (Signed)
Pt received one unit PRBCs; tolerated well, no s/s of complications or reactions; will continue to monitor hgb.

## 2013-03-27 NOTE — Progress Notes (Signed)
Notified Dr. Mikeal Hawthorne of patients Hgb 6.8; ordered 1 unit PRBCS, awaiting for unit to be ready; stopped continous 0.5mg  of PCA at 0815. Pt is alert and more awake this am; pain is decreasing

## 2013-03-27 NOTE — Progress Notes (Signed)
Notified Dr. Garba of patients Hgb 6.8; ordered 1 unit PRBCS, awaiting for unit to be ready; stopped continous 0.5mg of PCA at 0815. Pt is alert and more awake this am; pain is decreasing 

## 2013-03-27 NOTE — Progress Notes (Addendum)
CRITICAL VALUE ALERT  Critical value received: Hgb 6.8  Date of notification:  03/27/13   Time of notification:  0624  Critical value read back:yes  Nurse who received alert:  S.Young,RN   MD notified (1st page):  Janyth Contes   Time of first page:  0626  MD notified (2nd page):[phone]  Time of second page:  Responding MD:   Time MD responded:

## 2013-03-27 NOTE — Progress Notes (Signed)
Subjective: Patient is still having pain at 6/10. She has been using the PCA pump better now.She used 27mg  of Dilaudid in the last 24 hours with 32 demands. She also complains of nausea but no vomiting. The pain has transitioned to localize in her lower extremities. She was noted to have gone into hemolytic crisis yesterday and is still having evidence of hemolysis. No Fever, no shortness of breath. She has been able to to walk to the bathroom and back but has not been out of her room yet.   Objective: Vital signs in last 24 hours: Temp:  [98.2 F (36.8 C)-98.5 F (36.9 C)] 98.5 F (36.9 C) (11/12 0600) Pulse Rate:  [104-107] 104 (11/12 0600) Resp:  [14-20] 16 (11/12 0820) BP: (94-127)/(54-81) 127/81 mmHg (11/12 0600) SpO2:  [92 %-98 %] 98 % (11/12 0820) Weight change:  Last BM Date: 03/20/13  Intake/Output from previous day: 11/11 0701 - 11/12 0700 In: 783.3 [P.O.:240; I.V.:543.3] Out: 75 [Emesis/NG output:75] Intake/Output this shift: Total I/O In: 300 [P.O.:300] Out: -   General appearance: alert, cooperative and no distress Eyes: conjunctivae/corneas clear. PERRL, EOM's intact. Fundi benign. Neck: no adenopathy, no carotid bruit, no JVD, supple, symmetrical, trachea midline and thyroid not enlarged, symmetric, no tenderness/mass/nodules Back: symmetric, no curvature. ROM normal. No CVA tenderness. Resp: clear to auscultation bilaterally Cardio: regular rate and rhythm, S1, S2 normal, no murmur, click, rub or gallop GI: soft, non-tender; bowel sounds normal; no masses,  no organomegaly Extremities: extremities normal, atraumatic, no cyanosis or edema Pulses: 2+ and symmetric Skin: Skin color, texture, turgor normal. No rashes or lesions Neurologic: Grossly normal  Lab Results:  Recent Labs  03/26/13 0500 03/27/13 0535  WBC 17.7* 21.0*  HGB 7.3* 6.8*  HCT 20.2* 19.2*  PLT 461* 514*   BMET  Recent Labs  03/25/13 0535 03/26/13 0500  NA 140 137  K 4.3 4.4  CL  104 101  CO2 27 30  GLUCOSE 95 113*  BUN 11 13  CREATININE 0.73 0.72  CALCIUM 9.5 9.3    Studies/Results: Ir US Guide Vasc Access Right  03/25/2013   CLINICAL DATA:  Sickle cell crisis, pain  EXAM: RIGHT UPPER EXTREMITY PICC LINE PLACEMENT WITH ULTRASOUND AND FLUOROSCOPIC GUIDANCE  FLUOROSCOPY TIME:  12 seconds  PROCEDURE: The patient was advised of the possible risks and complications and agreed to undergo the procedure. The patient was then brought to the angiographic suite for the procedure.  The right arm was prepped with chlorhexidine, draped in the usual sterile fashion using maximum barrier technique (cap and mask, sterile gown, sterile gloves, large sterile sheet, hand hygiene and cutaneous antisepsis) and infiltrated locally with 1% Lidocaine.  Ultrasound demonstrated patency of the right basilic vein, and this was documented with an image. Under real-time ultrasound guidance, this vein was accessed with a 21 gauge micropuncture needle and image documentation was performed. A 0.018 wire was introduced in to the vein. Over this, a 5 [place] double lumen power injectable PICC was advanced to the lower SVC/right atrial junction. Fluoroscopy during the procedure and fluoro spot radiograph confirms appropriate catheter position. The catheter was flushed and covered with a sterile dressing.  Complications: No immediate  IMPRESSION: Successful right arm power PICC line placement with ultrasound and fluoroscopic guidance. The catheter is ready for use.   Electronically Signed   By: Ruel Favors M.D.   On: 03/25/2013 12:01   Ir Fluoro Guide Cv Midline Picc Right  03/25/2013   CLINICAL DATA:  Sickle cell  crisis, pain  EXAM: RIGHT UPPER EXTREMITY PICC LINE PLACEMENT WITH ULTRASOUND AND FLUOROSCOPIC GUIDANCE  FLUOROSCOPY TIME:  12 seconds  PROCEDURE: The patient was advised of the possible risks and complications and agreed to undergo the procedure. The patient was then brought to the angiographic suite  for the procedure.  The right arm was prepped with chlorhexidine, draped in the usual sterile fashion using maximum barrier technique (cap and mask, sterile gown, sterile gloves, large sterile sheet, hand hygiene and cutaneous antisepsis) and infiltrated locally with 1% Lidocaine.  Ultrasound demonstrated patency of the right basilic vein, and this was documented with an image. Under real-time ultrasound guidance, this vein was accessed with a 21 gauge micropuncture needle and image documentation was performed. A 0.018 wire was introduced in to the vein. Over this, a 5 [place] double lumen power injectable PICC was advanced to the lower SVC/right atrial junction. Fluoroscopy during the procedure and fluoro spot radiograph confirms appropriate catheter position. The catheter was flushed and covered with a sterile dressing.  Complications: No immediate  IMPRESSION: Successful right arm power PICC line placement with ultrasound and fluoroscopic guidance. The catheter is ready for use.   Electronically Signed   By: Ruel Favors M.D.   On: 03/25/2013 12:01    Medications: I have reviewed the patient's current medications.  Assessment/Plan:  #1 sickle cell painful crisis: Patient has full dose PCA with continuous dose at night. Her pain is getting better control. I will make no new changes. Will instead, encourage patient to keep using the PCA when awake. Mobilize patient to increase her activity level more.  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 Will transfuse 1 unit of PRBC and follow H/H.  #4 leukocytosis: Due to vaso-occlusive disease. Worsening due to hemolytic crisis. Will monitor.  #5 depression anxiety: Counseling provided again.  LOS: 5 days   GARBA,LAWAL 03/27/2013, 9:44 AM

## 2013-03-28 DIAGNOSIS — D649 Anemia, unspecified: Secondary | ICD-10-CM | POA: Diagnosis not present

## 2013-03-28 DIAGNOSIS — Z733 Stress, not elsewhere classified: Secondary | ICD-10-CM

## 2013-03-28 DIAGNOSIS — D5700 Hb-SS disease with crisis, unspecified: Secondary | ICD-10-CM | POA: Diagnosis not present

## 2013-03-28 DIAGNOSIS — D72829 Elevated white blood cell count, unspecified: Secondary | ICD-10-CM | POA: Diagnosis not present

## 2013-03-28 DIAGNOSIS — D473 Essential (hemorrhagic) thrombocythemia: Secondary | ICD-10-CM

## 2013-03-28 LAB — COMPREHENSIVE METABOLIC PANEL
Alkaline Phosphatase: 53 U/L (ref 39–117)
BUN: 5 mg/dL — ABNORMAL LOW (ref 6–23)
Creatinine, Ser: 0.54 mg/dL (ref 0.50–1.10)
GFR calc Af Amer: >90 mL/min (ref >90)
Glucose, Bld: 98 mg/dL (ref 70–99)
Potassium: 3.9 mEq/L (ref 3.5–5.1)
Total Protein: 6.4 g/dL (ref 6.0–8.3)

## 2013-03-28 LAB — CBC WITH DIFFERENTIAL/PLATELET
Basophils Relative: 1 % (ref 0–1)
Eosinophils Absolute: 0.6 10*3/uL (ref 0.0–0.7)
Eosinophils Relative: 3 % (ref 0–5)
HCT: 24 % — ABNORMAL LOW (ref 36.0–46.0)
Hemoglobin: 8.6 g/dL — ABNORMAL LOW (ref 12.0–15.0)
Lymphocytes Relative: 12 % (ref 12–46)
Lymphs Abs: 2.1 10*3/uL (ref 0.7–4.0)
MCH: 32.1 pg (ref 26.0–34.0)
MCHC: 35.8 g/dL (ref 30.0–36.0)
MCV: 89.6 fL (ref 78.0–100.0)
Monocytes Absolute: 1.5 10*3/uL — ABNORMAL HIGH (ref 0.1–1.0)
Monocytes Relative: 9 % (ref 3–12)
Neutrophils Relative %: 75 % (ref 43–77)
RBC: 2.68 MIL/uL — ABNORMAL LOW (ref 3.87–5.11)

## 2013-03-28 LAB — TYPE AND SCREEN
ABO/RH(D): B POS
Antibody Screen: NEGATIVE
Donor AG Type: NEGATIVE

## 2013-03-28 MED ORDER — DIPHENHYDRAMINE HCL 25 MG PO CAPS: 25.0000 mg | ORAL_CAPSULE | Freq: Four times a day (QID) | ORAL | Status: DC | PRN

## 2013-03-28 MED ORDER — OXYCODONE HCL 5 MG PO TABS
15.0000 mg | ORAL_TABLET | Freq: Four times a day (QID) | ORAL | Status: DC
  Administered 2013-03-28 – 2013-03-29 (×5): 15 mg via ORAL
  Filled 2013-03-28 (×5): qty 3

## 2013-03-28 MED ORDER — MAGNESIUM HYDROXIDE 400 MG/5ML PO SUSP
15.0000 mL | Freq: Once | ORAL | Status: AC
  Administered 2013-03-28: 15 mL via ORAL
  Filled 2013-03-28: qty 30

## 2013-03-28 NOTE — Progress Notes (Signed)
Spoke with patient at bedside to discuss [facility] Care Management services. States she has follow up with outpatient counseling that is working with her with taking her medications correctly and so forth. She states she follows up closely with the [facility] as well and does not think she needs [facility] Care Management services at current time. While speaking with patient, she put some baby powder in her mouth stating in helps with the bad taste of medicine. States she has been doing this since childhood. Made patient's nurse aware. Also left [facility] Care Management brochure at bedside for her to call in case she feels [facility] Care Management can be of any assistance to her. Right now, it appears she is connected with the community resources.  Raiford Noble, MSN- Ed, RN,BSN-- [facility] [phone]

## 2013-03-28 NOTE — Progress Notes (Signed)
SICKLE CELL SERVICE PROGRESS NOTE  DEVORAH GIVHAN ZOX:[id number] DOB: [birth year] DOA: 03/22/2013 PCP: Levonne [name] A., MD  Assessment/Plan: Principal Problem:   SICKLE CELL CRISIS Active Problems:   Anemia   GERD (gastroesophageal reflux disease)   Nausea & vomiting   Leukocytosis  1. Hb SS with crisis: Pt has used 13.39 mg of Dilaudid in the last 24 hours. She has had 31 demands and 27 deliveries. She is rating her pain as less than yesterday and states that she had a good night last night. Pt's clinical condition and indices are indicative of resolving crisis.  I will continue PCA today and change to scheduled oral analgesics today and stop PCA in am in anticipation of discharge home tomorrow. 2. Anemia: Pt is S/P transfusion of 1 Unit PRBC and she shows signs of slowed hemolysis and stable Hb.  3. 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 4. Constipation: Pt still has not had a BM. Will try MOM, prune juice and apple juice. If no results will give lactulose. 5. Leukocytosis: Secondary to crisis. Improving 6. Thrombocytosis: Chronic. Improving.  Code Status: Full Code Family Communication: N/A Disposition Plan: Home in 48-772 hours  Billie Trager A.  Pager [phone]. If 7PM-7AM, please contact night-coverage.  03/28/2013, 10:21 AM  LOS: 6 days   Brief narrative: SYDNEE LAMOUR is a 22 y.o. female with Sickle Cell disease who presents to the ED with complaints of severe unremitting pain in her left leg and left elbow going into her shoulder since the AM. She reports taking her home pain medication regimen without relief and she was advised to go to the ED. She denies having any fevers or chills , she does reports having nausea and vomiting but no diarrhea, and she reports occasional chest pain. She denies SOB.    Consultants:  None  Procedures:  None  Antibiotics:  None  HPI/Subjective: Pt states that she feels  much better today. She is tolerating diet without emesis. She still has not had a BM. She rates pain as 5-6/10 and was able to ambulate down the halls slowly but without need for assistive device. Objective: Filed Vitals:   03/27/13 2200 03/27/13 2253 03/28/13 0400 03/28/13 0600  BP: 113/70   110/52  Pulse: 91   88  Temp: 98.4 F (36.9 C)   98.2 F (36.8 C)  TempSrc: Oral   Oral  Resp: 20 12 13 16   Height:      Weight:      SpO2: 100% 100% 100% 100%   Weight change:   Intake/Output Summary (Last 24 hours) at 03/28/13 1021 Last data filed at 03/27/13 1819  Gross per 24 hour  Intake    840 ml  Output      0 ml  Net    840 ml    General: Alert, awake, oriented x3, in no acute distress.  HEENT: [place]/AT PEERL, EOMI, anicteric. Heart: Regular rate and rhythm, without murmurs, rubs, gallops.  Lungs: Clear to auscultation, no wheezing or rhonchi noted. Abdomen: Soft, nontender, nondistended, positive bowel sounds, no masses no hepatosplenomegaly noted.  Neuro: No focal neurological deficits noted cranial nerves II through XII grossly intact.  Musculoskeletal: No warm swelling or erythema around joints, no spinal tenderness noted. Psychiatric: Patient alert and oriented x3, good insight and cognition, good recent to remote recall.    Data Reviewed: Basic Metabolic Panel:  Recent Labs Lab 03/23/13 0500 03/24/13 0554 03/25/13 0535 03/26/13 0500 03/28/13 0708  NA  136 139 140 137 138  K 3.5 4.5 4.3 4.4 3.9  CL 102 105 104 101 104  CO2 24 28 27 30 28   GLUCOSE 99 100* 95 113* 98  BUN 7 8 11 13  5*  CREATININE 0.58 0.64 0.73 0.72 0.54  CALCIUM 9.2 9.0 9.5 9.3 9.0  MG 1.9  --   --   --   --    Liver Function Tests:  Recent Labs Lab 03/23/13 0500 03/26/13 0500 03/28/13 0708  AST 19 25 22   ALT 15 16 15   ALKPHOS 58 65 53  BILITOT 2.6* 4.1* 1.3*  PROT 7.0 7.1 6.4  ALBUMIN 4.0 4.0 3.3*   No results found for this basename: LIPASE, AMYLASE,  in the last 168 hours No  results found for this basename: AMMONIA,  in the last 168 hours CBC:  Recent Labs Lab 03/23/13 0030  03/23/13 0500 03/24/13 0554 03/25/13 0535 03/26/13 0500 03/27/13 0535 03/28/13 0708  WBC 20.6*  --  18.7* 14.2* 16.4* 17.7* 21.0* 17.0*  NEUTROABS 14.4*  --  12.3*  --   --  13.5* 15.3* 12.8*  HGB 8.7*  < > 8.5* 8.8* 8.2* 7.3* 6.8* 8.6*  HCT 25.2*  < > 24.2* 25.3* 23.2* 20.2* 19.2* 24.0*  MCV 90.0  --  90.0 90.0 89.9 88.6 88.5 89.6  PLT 481*  --  463* 551* 473* 461* 514* 481*  < > = values in this interval not displayed. Cardiac Enzymes: No results found for this basename: CKTOTAL, CKMB, CKMBINDEX, TROPONINI,  in the last 168 hours BNP (last 3 results) No results found for this basename: PROBNP,  in the last 8760 hours CBG: No results found for this basename: GLUCAP,  in the last 168 hours  No results found for this or any previous visit (from the past 240 hour(s)).   Studies: Dg Chest 2 View  03/22/2013   CLINICAL DATA:  Sickle cell crisis  EXAM: CHEST  2 VIEW  COMPARISON:  02/11/2013  FINDINGS: The heart size and mediastinal contours are within normal limits. Scarring identified in the right upper lobe. No pneumonia. Skeletal changes of sickle cell disease identified.  IMPRESSION: No active cardiopulmonary disease.   Electronically Signed   By: Signa Kell M.D.   On: 03/22/2013 23:43   Ir US Guide Vasc Access Right  03/25/2013   CLINICAL DATA:  Sickle cell crisis, pain  EXAM: RIGHT UPPER EXTREMITY PICC LINE PLACEMENT WITH ULTRASOUND AND FLUOROSCOPIC GUIDANCE  FLUOROSCOPY TIME:  12 seconds  PROCEDURE: The patient was advised of the possible risks and complications and agreed to undergo the procedure. The patient was then brought to the angiographic suite for the procedure.  The right arm was prepped with chlorhexidine, draped in the usual sterile fashion using maximum barrier technique (cap and mask, sterile gown, sterile gloves, large sterile sheet, hand hygiene and cutaneous  antisepsis) and infiltrated locally with 1% Lidocaine.  Ultrasound demonstrated patency of the right basilic vein, and this was documented with an image. Under real-time ultrasound guidance, this vein was accessed with a 21 gauge micropuncture needle and image documentation was performed. A 0.018 wire was introduced in to the vein. Over this, a 5 [place] double lumen power injectable PICC was advanced to the lower SVC/right atrial junction. Fluoroscopy during the procedure and fluoro spot radiograph confirms appropriate catheter position. The catheter was flushed and covered with a sterile dressing.  Complications: No immediate  IMPRESSION: Successful right arm power PICC line placement with ultrasound and fluoroscopic  guidance. The catheter is ready for use.   Electronically Signed   By: Ruel Favors M.D.   On: 03/25/2013 12:01   Ir Fluoro Guide Cv Midline Picc Right  03/25/2013   CLINICAL DATA:  Sickle cell crisis, pain  EXAM: RIGHT UPPER EXTREMITY PICC LINE PLACEMENT WITH ULTRASOUND AND FLUOROSCOPIC GUIDANCE  FLUOROSCOPY TIME:  12 seconds  PROCEDURE: The patient was advised of the possible risks and complications and agreed to undergo the procedure. The patient was then brought to the angiographic suite for the procedure.  The right arm was prepped with chlorhexidine, draped in the usual sterile fashion using maximum barrier technique (cap and mask, sterile gown, sterile gloves, large sterile sheet, hand hygiene and cutaneous antisepsis) and infiltrated locally with 1% Lidocaine.  Ultrasound demonstrated patency of the right basilic vein, and this was documented with an image. Under real-time ultrasound guidance, this vein was accessed with a 21 gauge micropuncture needle and image documentation was performed. A 0.018 wire was introduced in to the vein. Over this, a 5 [place] double lumen power injectable PICC was advanced to the lower SVC/right atrial junction. Fluoroscopy during the procedure and fluoro spot  radiograph confirms appropriate catheter position. The catheter was flushed and covered with a sterile dressing.  Complications: No immediate  IMPRESSION: Successful right arm power PICC line placement with ultrasound and fluoroscopic guidance. The catheter is ready for use.   Electronically Signed   By: Ruel Favors M.D.   On: 03/25/2013 12:01    Scheduled Meds: . enoxaparin (LOVENOX) injection  40 mg Subcutaneous Q24H  . folic acid  1 mg Oral Daily  . HYDROmorphone PCA 0.3 mg/mL   Intravenous Q4H  . hydroxyurea  1,000 mg Oral QHS  . magnesium hydroxide  15 mL Oral Once  . pantoprazole  40 mg Oral Daily   Or  . pantoprazole (PROTONIX) IV  40 mg Intravenous Daily  . prenatal multivitamin  1 tablet Oral Q1200  . senna-docusate  1 tablet Oral BID  . sertraline  50 mg Oral Daily   Total time 37 minutes.

## 2013-03-29 ENCOUNTER — Telehealth: Payer: Self-pay | Admitting: Internal Medicine

## 2013-03-29 DIAGNOSIS — D649 Anemia, unspecified: Secondary | ICD-10-CM | POA: Diagnosis not present

## 2013-03-29 DIAGNOSIS — D5700 Hb-SS disease with crisis, unspecified: Secondary | ICD-10-CM | POA: Diagnosis not present

## 2013-03-29 MED ORDER — HYDROMORPHONE 0.3 MG/ML IV SOLN: INTRAVENOUS | Status: DC

## 2013-03-29 MED ORDER — HYDROMORPHONE HCL PF 2 MG/ML IJ SOLN
2.0000 mg | INTRAMUSCULAR | Status: DC | PRN
  Administered 2013-03-29 (×3): 2 mg via INTRAVENOUS
  Filled 2013-03-29 (×3): qty 1

## 2013-03-29 MED ORDER — OXYCODONE HCL 10 MG PO TABS: 15.0000 mg | ORAL_TABLET | Freq: Four times a day (QID) | ORAL | Status: DC

## 2013-03-29 NOTE — Progress Notes (Signed)
Patient being d/c home with prescriptions. Verbalizes understanding. IV cath removed. No pain/swelling at site. Awaiting transport downstairs for ride.

## 2013-03-29 NOTE — Discharge Summary (Signed)
Physician Discharge Summary  Patient ID: [name] MRN: [id number] DOB/AGE: 1991-02-19 22 y.o.  Admit date: 03/22/2013 Discharge date: 03/29/2013  Admission Diagnoses:  Discharge Diagnoses:  Principal Problem:   SICKLE CELL CRISIS Active Problems:   GERD (gastroesophageal reflux disease)   Nausea & vomiting   Leukocytosis   Anemia   Discharged Condition: good  Hospital Course: A 22 year old female that was admitted with sickle cell painful crisis to the hospital. Patient had unusual cause of events. She had severe hemolytic crisis as well as painful crisis. She received 1 unit of packed red blood cells as well as Dilaudid PCA. Patient initially had an incident where she did not get full PCA and then went into more crisis in the hospital. She was subsequently treated adequately and her pain went from 10 out of 10 on admission to 3/10 at the time of discharge. She was discharged on increased dose of oxycodone IR. She was previously on 15 mg 3 times a day but was discharged on 15 mg 4 times a day until she is seen by Dr. Jerolyn Center at the sickle cell clinic next week. She is also to resume all her home medications as per prior to hospitalization. Her H&H was also stable after transfusion.  Consults: None  Significant Diagnostic Studies: labs: Patient had series of CBCs and complete metabolic panels. Her reticulocyte count also showed she did have active hemolysis.  Treatments: IV hydration, analgesia: Dilaudid and blood transfusion of one packed red blood cells.  Discharge Exam: Blood pressure 110/71, pulse 88, temperature 98.9 F (37.2 C), temperature source Oral, resp. rate 20, height 5' 2.5" (1.588 m), weight 81 kg (178 lb 9.2 oz), last menstrual period 03/08/2013, SpO2 94.00%. General appearance: alert, cooperative and appears stated age Eyes: conjunctivae/corneas clear. PERRL, EOM's intact. Fundi benign. Ears: normal TM's and external ear canals both ears Back: symmetric, no  curvature. ROM normal. No CVA tenderness. Resp: clear to auscultation bilaterally Cardio: regular rate and rhythm, S1, S2 normal, no murmur, click, rub or gallop GI: soft, non-tender; bowel sounds normal; no masses,  no organomegaly Extremities: extremities normal, atraumatic, no cyanosis or edema Pulses: 2+ and symmetric Skin: Skin color, texture, turgor normal. No rashes or lesions Neurologic: Grossly normal  Disposition: 01-Home or Self Care  Discharge Orders   Future Appointments Provider Department Dept Phone   04/02/2013 1:30 PM Altha Harm, MD [place] [facility] [phone]   04/04/2013 2:30 PM Altha Harm, MD [place] [facility] [phone]   Future Orders Complete By Expires   Care order/instruction  As directed    Comments:     Patient was in the hospital on October 29th and also from November 7th to 14th. Please excuse her from work or any functions she might have missed on those days.       Medication List         folic acid 800 MCG tablet  Commonly known as:  FOLVITE  Take 2 tablets (1,600 mcg total) by mouth every morning.     hydroxyurea 500 MG capsule  Commonly known as:  HYDREA  Take 2 capsules (1,000 mg total) by mouth at bedtime. May take with food to minimize GI side effects.     ondansetron 4 MG tablet  Commonly known as:  ZOFRAN  Take 1 tablet (4 mg total) by mouth every 6 (six) hours.     Oxycodone HCl 10 MG Tabs  Take 1.5 tablets (15 mg total) by mouth  4 (four) times daily.     polyethylene glycol packet  Commonly known as:  MIRALAX / GLYCOLAX  Take 17 g by mouth daily as needed (for constipation).     prenatal multivitamin Tabs tablet  Take 1 tablet by mouth daily at 12 noon.     sertraline 50 MG tablet  Commonly known as:  ZOLOFT  Take 1 tablet (50 mg total) by mouth daily.         SignedLonia Blood 03/29/2013, 2:21 PM

## 2013-04-02 ENCOUNTER — Ambulatory Visit: Payer: Medicare Other | Admitting: Internal Medicine

## 2013-04-04 ENCOUNTER — Ambulatory Visit: Payer: Medicare Other | Admitting: Internal Medicine

## 2013-04-08 ENCOUNTER — Telehealth (HOSPITAL_COMMUNITY): Payer: Self-pay | Admitting: Hematology

## 2013-04-08 ENCOUNTER — Telehealth: Payer: Self-pay | Admitting: Internal Medicine

## 2013-04-08 NOTE — Telephone Encounter (Signed)
Called patient and advised that I spoke with Dr. Ashley Royalty nurse, and she should keep taking her tylenol and excedrin and use a heating pad to her abdomen.  Advised her of her appointment on Wed @ 9:00.  Patient was in agreement and verbalizes understanding.

## 2013-04-08 NOTE — Telephone Encounter (Signed)
Patient called in complaining of pain to rib cages and abdomin.  Per patient her pain is 5/10.  Per patient "I believe it is because my menstral cycle is on, and I havent called in my prescription."  Per patient "I have been taking tylenol and excedrin and that normally works." I advised I would notify the physician and give her a call back

## 2013-04-10 ENCOUNTER — Encounter: Payer: Self-pay | Admitting: Internal Medicine

## 2013-04-10 ENCOUNTER — Ambulatory Visit (INDEPENDENT_AMBULATORY_CARE_PROVIDER_SITE_OTHER): Payer: Medicare Other | Admitting: Internal Medicine

## 2013-04-10 ENCOUNTER — Other Ambulatory Visit: Payer: Self-pay | Admitting: Internal Medicine

## 2013-04-10 VITALS — BP 123/92 | HR 99 | Temp 98.2°F | Resp 18 | Ht 64.0 in | Wt 180.0 lb

## 2013-04-10 DIAGNOSIS — F1190 Opioid use, unspecified, uncomplicated: Secondary | ICD-10-CM

## 2013-04-10 DIAGNOSIS — F1110 Opioid abuse, uncomplicated: Secondary | ICD-10-CM

## 2013-04-10 MED ORDER — OXYCODONE HCL 10 MG PO TABS: 15.0000 mg | ORAL_TABLET | Freq: Three times a day (TID) | ORAL | Status: DC | PRN

## 2013-04-10 NOTE — Telephone Encounter (Signed)
Called patient back in regards to her phone call in to the [facility] complaining of abmoninal pain that she felt was associated with her menstral cycle.

## 2013-04-10 NOTE — Progress Notes (Signed)
  Subjective:    Patient ID: [name], female    DOB: 04-22-1991, 22 y.o.   MRN: [id number]  HPI: Pt states that she was having some pain in her ribs on Monday but felt that she could handle it with her oral medications. She states that she feels good today.   I had discussed contraception with patient in the past and  After speaking with another patient she is willing to get contraception.     Review of Systems  Constitutional: Negative.   HENT: Negative.   Eyes: Negative.   Respiratory: Negative.   Cardiovascular: Negative.   Gastrointestinal: Negative.   Endocrine: Negative.   Genitourinary: Negative.   Musculoskeletal: Positive for myalgias.  Skin: Negative.   Allergic/Immunologic: Negative.   Neurological: Negative.   Hematological: Negative.   Psychiatric/Behavioral: Negative.        Objective:   Physical Exam  Constitutional: She is oriented to person, place, and time. She appears well-developed and well-nourished.  HENT:  Head: Normocephalic and atraumatic.  Eyes: Conjunctivae and EOM are normal. Pupils are equal, round, and reactive to light. No scleral icterus.  Neck: Normal range of motion. Neck supple. No JVD present. No thyromegaly present.  Cardiovascular: Normal rate and regular rhythm.  Exam reveals no gallop and no friction rub.   No murmur heard. Pulmonary/Chest: Effort normal and breath sounds normal. She has no wheezes. She has no rales.  Abdominal: Soft. Bowel sounds are normal. She exhibits no distension and no mass. There is no tenderness.  Musculoskeletal: Normal range of motion.  Neurological: She is alert and oriented to person, place, and time. No cranial nerve deficit.  Skin: Skin is warm and dry.  Psychiatric: She has a normal mood and affect. Her behavior is normal. Judgment and thought content normal.          Assessment & Plan:  1. Hb SS: Pt states that she is doing well with her symptom management.   Prescription issued for  Oxycodone 15 mg q 8 hours  (20 day supply). Will obtain prescription drug monitoring screen.  Vision tested in June.- no retinopathy  Pt  Has precipitation of her crises associated with her menses. She wants to start on birth control. Will reschedule for PAP smear and starting Depo-provera.  RTC: 1 week for PAP smear  Labs: none   Health Maintenance: Pap on next visit.:

## 2013-04-15 LAB — OPIATES/OPIOIDS (LC/MS-MS)
Codeine Urine: NEGATIVE ng/mL
Heroin (6-AM), UR: NEGATIVE ng/mL
Hydrocodone: 80 ng/mL — AB
Morphine Urine: NEGATIVE ng/mL

## 2013-04-15 LAB — OXYCODONE, URINE (LC/MS-MS)
Noroxycodone, Ur: NEGATIVE ng/mL
Oxycodone, ur: NEGATIVE ng/mL
Oxymorphone: NEGATIVE ng/mL

## 2013-04-16 LAB — PRESCRIPTION MONITORING PROFILE (SOLSTAS)
Amphetamine/Meth: NEGATIVE ng/mL
Barbiturate Screen, Urine: NEGATIVE ng/mL
Cannabinoid Scrn, Ur: NEGATIVE ng/mL
Carisoprodol, Urine: NEGATIVE ng/mL
Cocaine Metabolites: NEGATIVE ng/mL
Creatinine, Urine: 74 mg/dL (ref >20.0)
Fentanyl, Ur: NEGATIVE ng/mL
MDMA URINE: NEGATIVE ng/mL
Meperidine, Ur: NEGATIVE ng/mL
Methadone Screen, Urine: NEGATIVE ng/mL
Tapentadol, urine: NEGATIVE ng/mL
Tramadol Scrn, Ur: NEGATIVE ng/mL
pH, Initial: 6.9 pH (ref 4.5–8.9)

## 2013-04-17 ENCOUNTER — Ambulatory Visit: Payer: Medicare Other | Admitting: Internal Medicine

## 2013-04-23 ENCOUNTER — Telehealth: Payer: Self-pay | Admitting: Internal Medicine

## 2013-04-24 ENCOUNTER — Emergency Department (HOSPITAL_COMMUNITY)
Admission: EM | Admit: 2013-04-24 | Discharge: 2013-04-24 | Disposition: A | Discharge status: Home / Self Care | Payer: Medicare Other | Source: Home / Self Care | Attending: Emergency Medicine | Admitting: Emergency Medicine

## 2013-04-24 ENCOUNTER — Telehealth (HOSPITAL_COMMUNITY): Payer: Self-pay | Admitting: Hematology

## 2013-04-24 ENCOUNTER — Encounter (HOSPITAL_COMMUNITY): Payer: Self-pay | Admitting: Emergency Medicine

## 2013-04-24 ENCOUNTER — Emergency Department (HOSPITAL_COMMUNITY): Payer: Medicare Other

## 2013-04-24 ENCOUNTER — Inpatient Hospital Stay (HOSPITAL_COMMUNITY)
Admission: AD | Admit: 2013-04-24 | Discharge: 2013-04-29 | DRG: 812 | Disposition: A | Discharge status: Home / Self Care | Payer: Medicare Other | Attending: Family Medicine | Admitting: Family Medicine

## 2013-04-24 ENCOUNTER — Encounter (HOSPITAL_COMMUNITY): Payer: Self-pay | Admitting: *Deleted

## 2013-04-24 DIAGNOSIS — F17200 Nicotine dependence, unspecified, uncomplicated: Secondary | ICD-10-CM

## 2013-04-24 DIAGNOSIS — Z833 Family history of diabetes mellitus: Secondary | ICD-10-CM | POA: Diagnosis not present

## 2013-04-24 DIAGNOSIS — R000 Tachycardia, unspecified: Secondary | ICD-10-CM

## 2013-04-24 DIAGNOSIS — G43909 Migraine, unspecified, not intractable, without status migrainosus: Secondary | ICD-10-CM | POA: Diagnosis present

## 2013-04-24 DIAGNOSIS — G8929 Other chronic pain: Secondary | ICD-10-CM | POA: Insufficient documentation

## 2013-04-24 DIAGNOSIS — Z79899 Other long term (current) drug therapy: Secondary | ICD-10-CM | POA: Diagnosis not present

## 2013-04-24 DIAGNOSIS — Z8719 Personal history of other diseases of the digestive system: Secondary | ICD-10-CM | POA: Insufficient documentation

## 2013-04-24 DIAGNOSIS — J020 Streptococcal pharyngitis: Secondary | ICD-10-CM

## 2013-04-24 DIAGNOSIS — R51 Headache: Secondary | ICD-10-CM | POA: Diagnosis not present

## 2013-04-24 DIAGNOSIS — D72829 Elevated white blood cell count, unspecified: Secondary | ICD-10-CM | POA: Diagnosis not present

## 2013-04-24 DIAGNOSIS — M549 Dorsalgia, unspecified: Secondary | ICD-10-CM

## 2013-04-24 DIAGNOSIS — J3489 Other specified disorders of nose and nasal sinuses: Secondary | ICD-10-CM | POA: Diagnosis not present

## 2013-04-24 DIAGNOSIS — D5700 Hb-SS disease with crisis, unspecified: Principal | ICD-10-CM | POA: Diagnosis present

## 2013-04-24 DIAGNOSIS — Z8679 Personal history of other diseases of the circulatory system: Secondary | ICD-10-CM | POA: Insufficient documentation

## 2013-04-24 DIAGNOSIS — Z8249 Family history of ischemic heart disease and other diseases of the circulatory system: Secondary | ICD-10-CM

## 2013-04-24 DIAGNOSIS — F439 Reaction to severe stress, unspecified: Secondary | ICD-10-CM

## 2013-04-24 DIAGNOSIS — F6389 Other impulse disorders: Secondary | ICD-10-CM

## 2013-04-24 DIAGNOSIS — E663 Overweight: Secondary | ICD-10-CM

## 2013-04-24 DIAGNOSIS — Z87891 Personal history of nicotine dependence: Secondary | ICD-10-CM

## 2013-04-24 DIAGNOSIS — K219 Gastro-esophageal reflux disease without esophagitis: Secondary | ICD-10-CM | POA: Diagnosis present

## 2013-04-24 DIAGNOSIS — F329 Major depressive disorder, single episode, unspecified: Secondary | ICD-10-CM

## 2013-04-24 DIAGNOSIS — D649 Anemia, unspecified: Secondary | ICD-10-CM

## 2013-04-24 DIAGNOSIS — E43 Unspecified severe protein-calorie malnutrition: Secondary | ICD-10-CM

## 2013-04-24 DIAGNOSIS — Z8659 Personal history of other mental and behavioral disorders: Secondary | ICD-10-CM | POA: Insufficient documentation

## 2013-04-24 DIAGNOSIS — R079 Chest pain, unspecified: Secondary | ICD-10-CM | POA: Diagnosis not present

## 2013-04-24 DIAGNOSIS — R0602 Shortness of breath: Secondary | ICD-10-CM | POA: Diagnosis not present

## 2013-04-24 DIAGNOSIS — K5900 Constipation, unspecified: Secondary | ICD-10-CM | POA: Diagnosis present

## 2013-04-24 DIAGNOSIS — Z91040 Latex allergy status: Secondary | ICD-10-CM | POA: Insufficient documentation

## 2013-04-24 DIAGNOSIS — D571 Sickle-cell disease without crisis: Secondary | ICD-10-CM

## 2013-04-24 DIAGNOSIS — R040 Epistaxis: Secondary | ICD-10-CM | POA: Diagnosis not present

## 2013-04-24 DIAGNOSIS — Z23 Encounter for immunization: Secondary | ICD-10-CM

## 2013-04-24 DIAGNOSIS — R4586 Emotional lability: Secondary | ICD-10-CM

## 2013-04-24 DIAGNOSIS — R112 Nausea with vomiting, unspecified: Secondary | ICD-10-CM

## 2013-04-24 DIAGNOSIS — E876 Hypokalemia: Secondary | ICD-10-CM | POA: Diagnosis present

## 2013-04-24 DIAGNOSIS — R509 Fever, unspecified: Secondary | ICD-10-CM

## 2013-04-24 DIAGNOSIS — R1011 Right upper quadrant pain: Secondary | ICD-10-CM | POA: Diagnosis not present

## 2013-04-24 DIAGNOSIS — M79651 Pain in right thigh: Secondary | ICD-10-CM

## 2013-04-24 DIAGNOSIS — F32A Depression, unspecified: Secondary | ICD-10-CM

## 2013-04-24 LAB — RETICULOCYTES
RBC.: 2.92 MIL/uL — ABNORMAL LOW (ref 3.87–5.11)
Retic Count, Absolute: 353.3 10*3/uL — ABNORMAL HIGH (ref 19.0–186.0)
Retic Ct Pct: 12.1 % — ABNORMAL HIGH (ref 0.4–3.1)

## 2013-04-24 LAB — COMPREHENSIVE METABOLIC PANEL
AST: 16 U/L (ref 0–37)
Albumin: 3.7 g/dL (ref 3.5–5.2)
Alkaline Phosphatase: 61 U/L (ref 39–117)
BUN: 10 mg/dL (ref 6–23)
Calcium: 8.6 mg/dL (ref 8.4–10.5)
Chloride: 105 mEq/L (ref 96–112)
Potassium: 3.3 mEq/L — ABNORMAL LOW (ref 3.5–5.1)
Total Bilirubin: 1.2 mg/dL (ref 0.3–1.2)

## 2013-04-24 LAB — CBC
Platelets: 426 10*3/uL — ABNORMAL HIGH (ref 150–400)
RDW: 16 % — ABNORMAL HIGH (ref 11.5–15.5)
WBC: 17.8 10*3/uL — ABNORMAL HIGH (ref 4.0–10.5)

## 2013-04-24 LAB — LIPASE, BLOOD: Lipase: 59 U/L (ref 11–59)

## 2013-04-24 MED ORDER — HYDROMORPHONE HCL PF 2 MG/ML IJ SOLN
3.0000 mg | Freq: Once | INTRAMUSCULAR | Status: AC
  Administered 2013-04-24: 3 mg via INTRAVENOUS
  Filled 2013-04-24: qty 2

## 2013-04-24 MED ORDER — DIPHENHYDRAMINE HCL 50 MG/ML IJ SOLN
25.0000 mg | Freq: Once | INTRAMUSCULAR | Status: AC
  Administered 2013-04-24: 25 mg via INTRAVENOUS
  Filled 2013-04-24: qty 1

## 2013-04-24 MED ORDER — HYDROMORPHONE HCL PF 1 MG/ML IJ SOLN
1.0000 mg | Freq: Once | INTRAMUSCULAR | Status: AC
  Administered 2013-04-24: 1 mg via INTRAVENOUS
  Filled 2013-04-24: qty 1

## 2013-04-24 MED ORDER — ONDANSETRON HCL 4 MG/2ML IJ SOLN
4.0000 mg | Freq: Once | INTRAMUSCULAR | Status: AC
  Administered 2013-04-24: 4 mg via INTRAVENOUS
  Filled 2013-04-24: qty 2

## 2013-04-24 MED ORDER — ONDANSETRON HCL 4 MG/2ML IJ SOLN
4.0000 mg | Freq: Four times a day (QID) | INTRAMUSCULAR | Status: DC | PRN
  Administered 2013-04-27: 4 mg via INTRAVENOUS
  Filled 2013-04-24: qty 2

## 2013-04-24 MED ORDER — HYDROMORPHONE HCL PF 2 MG/ML IJ SOLN
2.0000 mg | Freq: Once | INTRAMUSCULAR | Status: AC
  Administered 2013-04-24: 2 mg via INTRAVENOUS
  Filled 2013-04-24: qty 1

## 2013-04-24 MED ORDER — HYDROMORPHONE HCL PF 2 MG/ML IJ SOLN
2.0000 mg | Freq: Once | INTRAMUSCULAR | Status: AC
  Administered 2013-04-24: 2 mg via SUBCUTANEOUS
  Filled 2013-04-24: qty 1

## 2013-04-24 MED ORDER — FOLIC ACID 1 MG PO TABS
1.0000 mg | ORAL_TABLET | Freq: Every day | ORAL | Status: DC
  Administered 2013-04-24 – 2013-04-29 (×6): 1 mg via ORAL
  Filled 2013-04-24 (×6): qty 1

## 2013-04-24 MED ORDER — SENNOSIDES-DOCUSATE SODIUM 8.6-50 MG PO TABS
1.0000 | ORAL_TABLET | Freq: Once | ORAL | Status: AC
  Administered 2013-04-24: 1 via ORAL
  Filled 2013-04-24: qty 1

## 2013-04-24 MED ORDER — KETOROLAC TROMETHAMINE 30 MG/ML IJ SOLN
30.0000 mg | Freq: Four times a day (QID) | INTRAMUSCULAR | Status: AC
  Administered 2013-04-24 – 2013-04-29 (×17): 30 mg via INTRAVENOUS
  Filled 2013-04-24 (×22): qty 1

## 2013-04-24 MED ORDER — SENNOSIDES-DOCUSATE SODIUM 8.6-50 MG PO TABS
1.0000 | ORAL_TABLET | Freq: Two times a day (BID) | ORAL | Status: DC
  Administered 2013-04-24 – 2013-04-29 (×10): 1 via ORAL
  Filled 2013-04-24 (×11): qty 1

## 2013-04-24 MED ORDER — POTASSIUM CHLORIDE CRYS ER 20 MEQ PO TBCR
40.0000 meq | EXTENDED_RELEASE_TABLET | Freq: Every day | ORAL | Status: DC
  Administered 2013-04-24 – 2013-04-29 (×6): 40 meq via ORAL
  Filled 2013-04-24 (×6): qty 2

## 2013-04-24 MED ORDER — SODIUM CHLORIDE 0.9 % IV BOLUS (SEPSIS)
1000.0000 mL | Freq: Once | INTRAVENOUS | Status: AC
  Administered 2013-04-24: 1000 mL via INTRAVENOUS

## 2013-04-24 MED ORDER — HYDROMORPHONE HCL PF 2 MG/ML IJ SOLN
2.5000 mg | Freq: Once | INTRAMUSCULAR | Status: AC
  Administered 2013-04-24: 2.5 mg via INTRAVENOUS
  Filled 2013-04-24: qty 2

## 2013-04-24 MED ORDER — POTASSIUM CHLORIDE CRYS ER 20 MEQ PO TBCR
40.0000 meq | EXTENDED_RELEASE_TABLET | Freq: Once | ORAL | Status: AC
  Administered 2013-04-24: 40 meq via ORAL
  Filled 2013-04-24: qty 2

## 2013-04-24 MED ORDER — SODIUM CHLORIDE 0.9 % IJ SOLN: 9.0000 mL | INTRAMUSCULAR | Status: DC | PRN

## 2013-04-24 MED ORDER — DEXTROSE-NACL 5-0.45 % IV SOLN
INTRAVENOUS | Status: DC
  Administered 2013-04-24 – 2013-04-27 (×5): via INTRAVENOUS
  Administered 2013-04-27: 1000 mL via INTRAVENOUS
  Administered 2013-04-27 – 2013-04-28 (×2): via INTRAVENOUS

## 2013-04-24 MED ORDER — HYDROMORPHONE 0.3 MG/ML IV SOLN
INTRAVENOUS | Status: DC
Start: 2013-04-24 — End: 2013-04-29
  Administered 2013-04-24: 12:00:00 via INTRAVENOUS
  Administered 2013-04-24: 1.6 mg via INTRAVENOUS
  Administered 2013-04-24: 19:00:00 via INTRAVENOUS
  Administered 2013-04-24: 1.6 mg via INTRAVENOUS
  Administered 2013-04-24: 3.19 mg via INTRAVENOUS
  Administered 2013-04-25: 7.19 mg via INTRAVENOUS
  Administered 2013-04-25: 23:00:00 via INTRAVENOUS
  Administered 2013-04-25: 2.39 mg via INTRAVENOUS
  Administered 2013-04-25: 4.39 mg via INTRAVENOUS
  Administered 2013-04-25: 1 mg via INTRAVENOUS
  Administered 2013-04-26: 6.64 mg via INTRAVENOUS
  Administered 2013-04-26: 7.51 mg via INTRAVENOUS
  Administered 2013-04-26 (×2): via INTRAVENOUS
  Administered 2013-04-26: 10.81 mg via INTRAVENOUS
  Administered 2013-04-26: 6.08 mg via INTRAVENOUS
  Administered 2013-04-26: 05:00:00 via INTRAVENOUS
  Administered 2013-04-27: 7.19 mg via INTRAVENOUS
  Administered 2013-04-27: 3.6 mg via INTRAVENOUS
  Administered 2013-04-27: 4.9 mg via INTRAVENOUS
  Administered 2013-04-27: 17:00:00 via INTRAVENOUS
  Administered 2013-04-27: 2.39 mg via INTRAVENOUS
  Administered 2013-04-27: 12:00:00 via INTRAVENOUS
  Administered 2013-04-27: 1.6 mg via INTRAVENOUS
  Administered 2013-04-27 (×2): via INTRAVENOUS
  Administered 2013-04-27: 3.61 mg via INTRAVENOUS
  Administered 2013-04-28: 6.03 mg via INTRAVENOUS
  Administered 2013-04-28: 10:00:00 via INTRAVENOUS
  Administered 2013-04-28: 8.62 mg via INTRAVENOUS
  Administered 2013-04-28: 1.89 mg via INTRAVENOUS
  Administered 2013-04-28: 10.48 mg via INTRAVENOUS
  Administered 2013-04-28: 19:00:00 via INTRAVENOUS
  Administered 2013-04-28: 3.99 mg via INTRAVENOUS
  Administered 2013-04-28: 2.87 mg via INTRAVENOUS
  Administered 2013-04-28 – 2013-04-29 (×4): via INTRAVENOUS
  Administered 2013-04-29: 6.72 mg via INTRAVENOUS
  Administered 2013-04-29: 08:00:00 via INTRAVENOUS
  Filled 2013-04-24 (×20): qty 25

## 2013-04-24 MED ORDER — DIPHENHYDRAMINE HCL 25 MG PO CAPS
25.0000 mg | ORAL_CAPSULE | Freq: Four times a day (QID) | ORAL | Status: DC | PRN
  Administered 2013-04-24: 50 mg via ORAL
  Administered 2013-04-25: 25 mg via ORAL
  Administered 2013-04-25: 50 mg via ORAL
  Administered 2013-04-25: 25 mg via ORAL
  Filled 2013-04-24: qty 2
  Filled 2013-04-24 (×3): qty 1

## 2013-04-24 MED ORDER — SERTRALINE HCL 50 MG PO TABS
50.0000 mg | ORAL_TABLET | Freq: Every day | ORAL | Status: DC
  Administered 2013-04-24 – 2013-04-29 (×6): 50 mg via ORAL
  Filled 2013-04-24 (×6): qty 1

## 2013-04-24 MED ORDER — NALOXONE HCL 0.4 MG/ML IJ SOLN: 0.4000 mg | INTRAMUSCULAR | Status: DC | PRN

## 2013-04-24 MED ORDER — POLYETHYLENE GLYCOL 3350 17 G PO PACK
17.0000 g | PACK | Freq: Every day | ORAL | Status: DC | PRN
  Filled 2013-04-24 (×2): qty 1

## 2013-04-24 NOTE — H&P (Signed)
[facility] History and Physical  [name] ZOX:[id number] DOB: 11-17-1990 DOA: 04/24/2013   PCP: Shey Yott A., MD   Chief Complaint: Pain in legs and back x 3 days.   HPI:Pt states that she was having pain in the last 3 days that was manageable with her oral medications. The pain escalated last night to the pint where she was unable to manage at home. Pt was seen in the ED and discharged but was still having pain. She presented to the [facility] with pain at intensity of 8/10 up from her baseline of 4/10. Pain is localized to back and legs with occasional pain in the right groin.    Review of Systems:  Constitutional: No weight loss, night sweats, Fevers, chills, fatigue.  HEENT: No headaches, dizziness, seizures, vision changes, difficulty swallowing,Tooth/dental problems,Sore throat, No sneezing, itching, ear ache, nasal congestion, post nasal drip,  Cardio-vascular: No chest pain, Orthopnea, PND, swelling in lower extremities, anasarca, dizziness, palpitations  GI: No heartburn, indigestion, abdominal pain, nausea, vomiting, diarrhea, change in bowel habits, loss of appetite  Resp: No shortness of breath with exertion or at rest. No excess mucus, no productive cough, No non-productive cough, No coughing up of blood.No change in color of mucus.No wheezing.No chest wall deformity  Skin: no rash or lesions.  GU: no dysuria, change in color of urine, no urgency or frequency. No flank pain.  Psych: No change in mood or affect. No depression or anxiety. No memory loss.    Past Medical History  Diagnosis Date  . H/O: 1 miscarriage 03/22/2011  . 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 Depression 01/06/2011  . GERD (gastroesophageal reflux disease) 02/17/2011  . Trichotillomania     h/o  . Blood transfusion     "lots"  . Sickle cell anemia with crisis   . Exertional dyspnea     "sometimes"  . Sickle cell anemia   . Headache(784.0)   . Migraines 11/08/11     "@ least twice/month"  . Chronic back pain     "very severe; have knot in my back; from tight muscle; take RX and exercise for it"  . Mood swings 11/08/11    "I go back and forth; real bad"  . Pregnancy   . Blood dyscrasia     SICKLE CELL   Past Surgical History  Procedure Laterality Date  . Cholecystectomy  05/2010  . Dilation and curettage of uterus  02/20/11    S/P miscarriage   Social History:  reports that she quit smoking about 4 weeks ago. Her smoking use included Cigarettes. She has a .25 pack-year smoking history. She has never used smokeless tobacco. She reports that she drinks alcohol. She reports that she does not use illicit drugs.  Allergies  Allergen Reactions  . Carrot Oil Hives and Swelling  . Latex Rash    Family History  Problem Relation Age of Onset  . Diabetes Mother   . Alcoholism    . Depression    . Hypercholesterolemia    . Hypertension    . Migraines    . Diabetes Maternal Grandmother   . Diabetes Paternal Grandmother     Prior to Admission medications   Medication Sig Start Date End Date Taking? Authorizing Provider  folic acid (FOLVITE) 800 MCG tablet Take 2 tablets (1,600 mcg total) by mouth every morning. 02/26/13  Yes Altha Harm, MD  hydroxyurea (HYDREA) 500 MG capsule Take 2 capsules (1,000 mg total) by mouth at bedtime. May  take with food to minimize GI side effects. 01/18/13  Yes Grayce Sessions, NP  ondansetron (ZOFRAN) 4 MG tablet Take 1 tablet (4 mg total) by mouth every 6 (six) hours. 03/08/13  Yes Fayrene Helper, PA-C  Oxycodone HCl 10 MG TABS Take 1.5 tablets (15 mg total) by mouth 3 (three) times daily as needed. 04/10/13  Yes Altha Harm, MD  Prenatal Vit-Fe Fumarate-FA (PRENATAL MULTIVITAMIN) TABS tablet Take 1 tablet by mouth daily at 12 noon.   Yes Historical Provider, MD  sertraline (ZOLOFT) 50 MG tablet Take 1 tablet (50 mg total) by mouth daily. 02/26/13  Yes Altha Harm, MD  polyethylene glycol (MIRALAX  / GLYCOLAX) packet Take 17 g by mouth daily as needed (for constipation).    Historical Provider, MD   Physical Exam: Filed Vitals:   04/24/13 0832  BP: 120/77  Pulse: 110  Temp: 98.7 F (37.1 C)  TempSrc: Oral  Resp: 20  SpO2: 97%   BP 113/62  Pulse 102  Temp(Src) 98 F (36.7 C) (Oral)  Resp 11  SpO2 100%  LMP 04/06/2013  General Appearance:    Alert, cooperative, no distress, appears stated age  Head:    Normocephalic, without obvious abnormality, atraumatic  Eyes:    PERRL, conjunctiva/corneas clear, EOM's intact, fundi    benign, both eyes anicteric  Throat:   Lips, mucosa, and tongue normal; teeth and gums normal  Neck:   Supple, symmetrical, trachea midline, no adenopathy;    thyroid:  no enlargement/tenderness/nodules; no carotid   bruit or JVD  Back:     Symmetric, no curvature, ROM normal, no CVA tenderness  Lungs:     Clear to auscultation bilaterally, respirations unlabored  Chest Wall:    No tenderness or deformity   Heart:    Regular rate and rhythm, S1 and S2 normal, no murmur, rub   or gallop  Abdomen:     Soft, non-tender, bowel sounds active all four quadrants,    no masses, no organomegaly  Extremities:   Extremities normal, atraumatic, no cyanosis or edema  Pulses:   2+ and symmetric all extremities  Skin:   Skin color, texture, turgor normal, no rashes or lesions  Lymph nodes:   Cervical, supraclavicular, and axillary nodes normal  Neurologic:   CNII-XII intact, normal strength, sensation and reflexes    throughout    Labs on Admission:   Basic Metabolic Panel:  Recent Labs Lab 04/24/13 0409  NA 138  K 3.3*  CL 105  CO2 24  GLUCOSE 92  BUN 10  CREATININE 0.73  CALCIUM 8.6   Liver Function Tests:  Recent Labs Lab 04/24/13 0409  AST 16  ALT 12  ALKPHOS 61  BILITOT 1.2  PROT 6.9  ALBUMIN 3.7   CBC:  Recent Labs Lab 04/24/13 0312  WBC 17.8*  HGB 9.0*  HCT 25.8*  MCV 88.4  PLT 426*   BNP: No components found with this  basename: POCBNP,    Radiological Exams on Admission: Dg Chest 2 View  04/24/2013   CLINICAL DATA:  Right chest pain; sickle cell crisis.  EXAM: CHEST  2 VIEW  COMPARISON:  Chest radiograph performed at 11/2011  FINDINGS: The lungs are well-aerated and clear. There is no evidence of focal opacification, pleural effusion or pneumothorax.  The heart is normal in size; the mediastinal contour is within normal limits. No acute osseous abnormalities are seen. Clips are noted within the right upper quadrant, reflecting prior cholecystectomy. H-shaped vertebral bodies  reflect the patient's sickle cell disease.  IMPRESSION: No acute cardiopulmonary process seen.   Electronically Signed   By: Roanna Raider M.D.   On: 04/24/2013 03:24   US Abdomen Complete  04/24/2013   CLINICAL DATA:  Right upper quadrant abdominal pain.  EXAM: ULTRASOUND ABDOMEN COMPLETE  COMPARISON:  CT of the abdomen and pelvis performed 06/22/2012, and abdominal ultrasound performed 03/14/2012  FINDINGS: Gallbladder:  Status post cholecystectomy.  No retained stones seen.  Common bile duct:  Diameter: 0.3 cm, within normal limits in caliber.  Liver:  No focal lesion identified. Within normal limits in parenchymal echogenicity.  IVC:  No abnormality visualized.  Pancreas:  Obscured by overlying bowel gas.  Spleen:  Size within normal limits; somewhat echogenic in appearance, with a nodular contour. This is unchanged from the prior CT.  Right Kidney:  Length: 10.6 cm. Echogenicity within normal limits. No mass or hydronephrosis visualized.  Left Kidney:  Length: 10.2 cm. Echogenicity within normal limits. No mass or hydronephrosis visualized.  Abdominal aorta:  No aneurysm visualized.  Other findings:  None.  IMPRESSION: 1. No acute abnormality seen within the abdomen. 2. 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   Electronically Signed   By: Roanna Raider M.D.   On: 04/24/2013 04:51      Assessment/Plan: Active  Problems: 1. Hb SS with crisis: Pt will be managed with IVF, Toradol, IV dilaudid likely via PCA. Goal is to get pain to 5/10 and independent ambulation. If patient unable to achieve goals will re-assess for admission.  2. Hypokalemia: Repleted in ED.   Time spend: 40  Code Status: Full Code Family Communication: N/A Disposition Plan: Likely admission to hospital.  Naylin Burkle A., MD  Pager [phone]  If 7PM-7AM, please contact night-coverage [email or web address] Password [email or web address] 04/24/2013, 9:09 AM

## 2013-04-24 NOTE — Discharge Summary (Signed)
Physician Discharge Summary  [name] RUE:[id number] DOB: 03-15-1991 DOA: 04/24/2013  PCP: Zell Hylton A., MD  Admit date: 04/24/2013 Discharge date: 04/24/2013  Discharge Diagnoses:  Active Problems:   SICKLE CELL CRISIS   Discharge Condition: Stable  Disposition: Admitted to Hospital  Diet: Regular Wt Readings from Last 3 Encounters:  04/10/13 180 lb (81.647 kg)  03/23/13 178 lb 9.2 oz (81 kg)  02/26/13 182 lb (82.555 kg)    History of present illness:  Pt states that she was having pain in the last 3 days that was manageable with her oral medications. The pain escalated last night to the pint where she was unable to manage at home. Pt was seen in the ED and discharged but was still having pain. She presented to the [facility] with pain at intensity of 8/10 up from her baseline of 4/10. Pain is localized to back and legs with occasional pain in the right groin.      Hospital Course:  Pt was treated with IV dilaudid with initial clinician assisted doses weight based then transitioned to PCA. She had some decrease in her pain however her pain was still intense enough to impair ambulation. Pt was admitted to the hospital for further management.  Discharge Exam: Filed Vitals:   04/24/13 1717  BP: 117/90  Pulse: 111  Temp: 98.1 F (36.7 C)  Resp: 11   Filed Vitals:   04/24/13 1220 04/24/13 1421 04/24/13 1527 04/24/13 1717  BP:    117/90  Pulse: 102 102  111  Temp:    98.1 F (36.7 C)  TempSrc:    Oral  Resp:  11 11 11   SpO2: 100% 100% 100% 97%  General: Alert, awake, oriented x3, in no mild distress.  HEENT: [place]/AT PEERL, EOMI, Anicteric  Neck: Trachea midline, no masses, no thyromegal,y no JVD, no carotid bruit  OROPHARYNX: Moist, No exudate/ erythema/lesions.  Heart: Regular rate and rhythm, without murmurs, rubs, gallops.  Lungs: Clear to auscultation, no wheezing or rhonchi noted.  Abdomen: Soft, nontender, nondistended, positive  bowel sounds, no masses no hepatosplenomegaly noted.  Neuro: No focal neurological deficits noted cranial nerves II through XII grossly intact.  Strength normal in bilateral upper and lower extremities however gait limited by pain.  Musculoskeletal: No warm swelling or erythema around joints, no spinal tenderness noted.  Psychiatric: Patient alert and oriented x3, good insight and cognition, good recent to remote recall.  Lymph node survey: No cervical axillary or inguinal lymphadenopathy noted.    Discharge Instructions: transfer to [facility] hospital.   Future Appointments Provider Department Dept Phone   05/01/2013 2:00 PM Altha Harm, MD [facility] [phone]       Medication List    ASK your doctor about these medications       folic acid 800 MCG tablet  Commonly known as:  FOLVITE  Take 2 tablets (1,600 mcg total) by mouth every morning.     hydroxyurea 500 MG capsule  Commonly known as:  HYDREA  Take 2 capsules (1,000 mg total) by mouth at bedtime. May take with food to minimize GI side effects.     ondansetron 4 MG tablet  Commonly known as:  ZOFRAN  Take 1 tablet (4 mg total) by mouth every 6 (six) hours.     Oxycodone HCl 10 MG Tabs  Take 1.5 tablets (15 mg total) by mouth 3 (three) times daily as needed.     polyethylene glycol packet  Commonly  known as:  MIRALAX / GLYCOLAX  Take 17 g by mouth daily as needed (for constipation).     prenatal multivitamin Tabs tablet  Take 1 tablet by mouth daily at 12 noon.     sertraline 50 MG tablet  Commonly known as:  ZOLOFT  Take 1 tablet (50 mg total) by mouth daily.          The results of significant diagnostics from this hospitalization (including imaging, microbiology, ancillary and laboratory) are listed below for reference.    Significant Diagnostic Studies: Dg Chest 2 View  04/24/2013   CLINICAL DATA:  Right chest pain; sickle cell crisis.  EXAM: CHEST  2 VIEW  COMPARISON:  Chest  radiograph performed at 11/2011  FINDINGS: The lungs are well-aerated and clear. There is no evidence of focal opacification, pleural effusion or pneumothorax.  The heart is normal in size; the mediastinal contour is within normal limits. No acute osseous abnormalities are seen. Clips are noted within the right upper quadrant, reflecting prior cholecystectomy. H-shaped vertebral bodies reflect the patient's sickle cell disease.  IMPRESSION: No acute cardiopulmonary process seen.   Electronically Signed   By: Roanna Raider M.D.   On: 04/24/2013 03:24   US Abdomen Complete  04/24/2013   CLINICAL DATA:  Right upper quadrant abdominal pain.  EXAM: ULTRASOUND ABDOMEN COMPLETE  COMPARISON:  CT of the abdomen and pelvis performed 06/22/2012, and abdominal ultrasound performed 03/14/2012  FINDINGS: Gallbladder:  Status post cholecystectomy.  No retained stones seen.  Common bile duct:  Diameter: 0.3 cm, within normal limits in caliber.  Liver:  No focal lesion identified. Within normal limits in parenchymal echogenicity.  IVC:  No abnormality visualized.  Pancreas:  Obscured by overlying bowel gas.  Spleen:  Size within normal limits; somewhat echogenic in appearance, with a nodular contour. This is unchanged from the prior CT.  Right Kidney:  Length: 10.6 cm. Echogenicity within normal limits. No mass or hydronephrosis visualized.  Left Kidney:  Length: 10.2 cm. Echogenicity within normal limits. No mass or hydronephrosis visualized.  Abdominal aorta:  No aneurysm visualized.  Other findings:  None.  IMPRESSION: 1. No acute abnormality seen within the abdomen. 2. 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   Electronically Signed   By: Roanna Raider M.D.   On: 04/24/2013 04:51    Microbiology: No results found for this or any previous visit (from the past 240 hour(s)).   Labs: Basic Metabolic Panel:  Recent Labs Lab 04/24/13 0409  NA 138  K 3.3*  CL 105  CO2 24  GLUCOSE 92  BUN  10  CREATININE 0.73  CALCIUM 8.6   Liver Function Tests:  Recent Labs Lab 04/24/13 0409  AST 16  ALT 12  ALKPHOS 61  BILITOT 1.2  PROT 6.9  ALBUMIN 3.7    Recent Labs Lab 04/24/13 0409  LIPASE 59   No results found for this basename: AMMONIA,  in the last 168 hours CBC:  Recent Labs Lab 04/24/13 0312  WBC 17.8*  HGB 9.0*  HCT 25.8*  MCV 88.4  PLT 426*   Cardiac Enzymes: No results found for this basename: CKTOTAL, CKMB, CKMBINDEX, TROPONINI,  in the last 168 hours BNP: No components found with this basename: POCBNP,  CBG: No results found for this basename: GLUCAP,  in the last 168 hours Ferritin: No results found for this basename: FERRITIN,  in the last 168 hours  Time coordinating discharge: 35  Signed:  Jaydenn Boccio A.  04/24/2013,  5:21 PM

## 2013-04-24 NOTE — Progress Notes (Signed)
Patient c/o constipation and straining with bowel movement. MD notified. Orders given and initiated. To continue to monitor.

## 2013-04-24 NOTE — Telephone Encounter (Signed)
Called patient back in regards to her previous phone call stating she was in pain 8/10 on her right side and having difficulty walking.  I advised patient that I spoke with Dr. Ashley Royalty, and if she is able to walk to the [facility] then she may be treated. Explained that one of the criteria for admission at the [facility] is that the patient be ambulatory.  Patient understood that and agreed to come to day hospital for treatment.

## 2013-04-24 NOTE — ED Notes (Signed)
Brought in by EMS from home with c/o sickle cell pain crisis.  Per EMS, pt reported that she has been having "pain crisis" since Sunday; pt has been taking her pain medication regimen but reports, "Pain is getting progressively worse".  Pt reports worst pain in her right side, just below rib cage.

## 2013-04-24 NOTE — Telephone Encounter (Signed)
See other note

## 2013-04-24 NOTE — ED Notes (Addendum)
Writer asked MD if he was planning to admit pt to update pt on status, stated that pt could be admitted but would have to wait another 4 hours for a repeat dose of her pain medication.

## 2013-04-24 NOTE — H&P (Signed)
Hospital Admission Note Date: 04/24/2013  Patient name: [name] Medical record number: [id number] Date of birth: 1990-10-18 Age: 22 y.o. Gender: female PCP: MATTHEWS,MICHELLE A., MD  Attending physician: Altha Harm, MD  Chief Complaint: Pt with pain x 3 days not controlled with current analgesics.  History of Present Illness: Pt was seen in the ED, treated and discharged. She presented to Day hospital for additional treatment as her pain was uncontrolled and she was unable to ambulate. She was treated with weight based re-dosing and PCA however although pain is decreased she is unable to ambulate independently. Pt currently rates pain as 6/10. Pain is localized to back and legs. She is being admitted to the hospital for further treatment of her pain.  Scheduled Meds: . folic acid  1 mg Oral Daily  . HYDROmorphone PCA 0.3 mg/mL   Intravenous Q4H  . ketorolac  30 mg Intravenous Q6H  . sertraline  50 mg Oral Daily   Continuous Infusions: . dextrose 5 % and 0.45% NaCl 125 mL/hr at 04/24/13 0855   PRN Meds:.diphenhydrAMINE, naloxone, ondansetron (ZOFRAN) IV, sodium chloride Allergies: Carrot oil and Latex Past Medical History  Diagnosis Date  . H/O: 1 miscarriage 03/22/2011  . 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 Depression 01/06/2011  . GERD (gastroesophageal reflux disease) 02/17/2011  . Trichotillomania     h/o  . Blood transfusion     "lots"  . Sickle cell anemia with crisis   . Exertional dyspnea     "sometimes"  . Sickle cell anemia   . Headache(784.0)   . Migraines 11/08/11    "@ least twice/month"  . Chronic back pain     "very severe; have knot in my back; from tight muscle; take RX and exercise for it"  . Mood swings 11/08/11    "I go back and forth; real bad"  . Pregnancy   . Blood dyscrasia     SICKLE CELL   Past Surgical History  Procedure Laterality Date  . Cholecystectomy  05/2010  . Dilation and curettage of uterus  02/20/11    S/P miscarriage    Family History  Problem Relation Age of Onset  . Diabetes Mother   . Alcoholism    . Depression    . Hypercholesterolemia    . Hypertension    . Migraines    . Diabetes Maternal Grandmother   . Diabetes Paternal Grandmother    History   Social History  . Marital Status: Single    Spouse Name: N/A    Number of Children: N/A  . Years of Education: N/A   Occupational History  . Not on file.   Social History Main Topics  . Smoking status: Former Smoker -- 0.25 packs/day for 1 years    Types: Cigarettes    Quit date: 03/25/2013  . Smokeless tobacco: Never Used  . Alcohol Use: Yes     Comment: pt states she quit marijuan in May 2013. Rare ETOH, + cigarettes.  She is enrolled in school  . Drug Use: No  . Sexual Activity: Not Currently    Birth Control/ Protection: None   Other Topics Concern  . 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  Physical Exam:  Intake/Output Summary (Last 24 hours) at  04/24/13 1721 Last data filed at 04/24/13 1348  Gross per 24 hour  Intake    480 ml  Output      0 ml  Net    480 ml   General: Alert, awake, oriented x3, in no acute distress.  HEENT: [place]/AT PEERL, EOMI Neck: Trachea midline,  no masses, no thyromegal,y no JVD, no carotid bruit OROPHARYNX:  Moist, No exudate/ erythema/lesions.  Heart: Regular rate and rhythm, without murmurs, rubs, gallops, PMI non-displaced, no heaves or thrills on palpation.  Lungs: Clear to auscultation, no wheezing or rhonchi noted. No increased vocal fremitus resonant to percussion  Abdomen: Soft, nontender, nondistended, positive bowel sounds, no masses no hepatosplenomegaly noted..  Neuro: No focal neurological deficits noted cranial nerves II through XII grossly intact. DTRs 2+ bilaterally upper and  lower extremities. Strength 5 out of 5 in bilateral upper and lower extremities. Musculoskeletal: No warm swelling or erythema around joints, no spinal tenderness noted. Psychiatric: Patient alert and oriented x3, good insight and cognition, good recent to remote recall. Lymph node survey: No cervical axillary or inguinal lymphadenopathy noted.  Lab results:  Recent Labs  04/24/13 0409  NA 138  K 3.3*  CL 105  CO2 24  GLUCOSE 92  BUN 10  CREATININE 0.73  CALCIUM 8.6    Recent Labs  04/24/13 0409  AST 16  ALT 12  ALKPHOS 61  BILITOT 1.2  PROT 6.9  ALBUMIN 3.7    Recent Labs  04/24/13 0409  LIPASE 59    Recent Labs  04/24/13 0312  WBC 17.8*  HGB 9.0*  HCT 25.8*  MCV 88.4  PLT 426*   No results found for this basename: CKTOTAL, CKMB, CKMBINDEX, TROPONINI,  in the last 72 hours No components found with this basename: POCBNP,  No results found for this basename: DDIMER,  in the last 72 hours No results found for this basename: HGBA1C,  in the last 72 hours No results found for this basename: CHOL, HDL, LDLCALC, TRIG, CHOLHDL, LDLDIRECT,  in the last 72 hours No results found for this basename: TSH, T4TOTAL, FREET3, T3FREE, THYROIDAB,  in the last 72 hours  Recent Labs  04/24/13 0312  RETICCTPCT 12.1*   Imaging results:  Dg Chest 2 View  04/24/2013   CLINICAL DATA:  Right chest pain; sickle cell crisis.  EXAM: CHEST  2 VIEW  COMPARISON:  Chest radiograph performed at 11/2011  FINDINGS: The lungs are well-aerated and clear. There is no evidence of focal opacification, pleural effusion or pneumothorax.  The heart is normal in size; the mediastinal contour is within normal limits. No acute osseous abnormalities are seen. Clips are noted within the right upper quadrant, reflecting prior cholecystectomy. H-shaped vertebral bodies reflect the patient's sickle cell disease.  IMPRESSION: No acute cardiopulmonary process seen.   Electronically Signed   By: Roanna Raider  M.D.   On: 04/24/2013 03:24   US Abdomen Complete  04/24/2013   CLINICAL DATA:  Right upper quadrant abdominal pain.  EXAM: ULTRASOUND ABDOMEN COMPLETE  COMPARISON:  CT of the abdomen and pelvis performed 06/22/2012, and abdominal ultrasound performed 03/14/2012  FINDINGS: Gallbladder:  Status post cholecystectomy.  No retained stones seen.  Common bile duct:  Diameter: 0.3 cm, within normal limits in caliber.  Liver:  No focal lesion identified. Within normal limits in parenchymal echogenicity.  IVC:  No abnormality visualized.  Pancreas:  Obscured by overlying bowel gas.  Spleen:  Size within normal limits; somewhat echogenic in appearance, with a  nodular contour. This is unchanged from the prior CT.  Right Kidney:  Length: 10.6 cm. Echogenicity within normal limits. No mass or hydronephrosis visualized.  Left Kidney:  Length: 10.2 cm. Echogenicity within normal limits. No mass or hydronephrosis visualized.  Abdominal aorta:  No aneurysm visualized.  Other findings:  None.  IMPRESSION: 1. No acute abnormality seen within the abdomen. 2. 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   Electronically Signed   By: Roanna Raider M.D.   On: 04/24/2013 04:51   Assessment/Plan 1. Hb SS with crisis: Continue PCA, IVF, Toradol and heating pad. Re-assess in morning.  2. Constipation: Continue Senna-S and Miralax PRN  3. Hypokalemia: replete orally  4. Leukocytosis: No signs of infection. Likely secondary to acute crisis.    MATTHEWS,MICHELLE A. 04/24/2013, 5:21 PM

## 2013-04-24 NOTE — ED Notes (Signed)
This Clinical research associate (Press photographer) was called to room to speak with patient.  I offered to sit and talk with patient to find a solution to the problem calmly.  Pt states that she "doesn't want to talk anymore".  Pt told me to leave but also stated that she wanted the full name of everyone who cared for her so she could sue the hospital.  Pt continuing to be on the phone, supposedly with her lawyer.  Pt continuing to be verbally aggressive to staff.  I supplied the patient with a copy of the patient bill of rights, a Training and development officer with the number to the patient satisfaction office, and also wrote down the names of her health care team.  Pt encouraged to follow up with [facility]. Pt states "I don't give a damn what yall say.  I am gonna sit right here as long as I want.  I am going to take my sweet time".  Pt informed that she has been discharged and that I have offered to talk with her calmly and that her behavior will not be tolerated.  [employer] at bedside and escorted patient to lobby.

## 2013-04-24 NOTE — ED Notes (Signed)
MD stated he will be back around to speak with pt again.

## 2013-04-24 NOTE — Progress Notes (Signed)
Patient to be admitted to inpatient per MD order. Report called. Patient transferred with belongings via wheelchair by RN staff to [facility] room 1308. Patient tolerated transfer well on RA, VSS. Patient left resting quietly at bedside. Floor CNA and RN made aware of patient's arrival to room.

## 2013-04-24 NOTE — Telephone Encounter (Signed)
Patient called in crying stating she is in pain on her right side that is 8/10 and she is having difficulty walking.  According to patient she has been discharged from the ED and escorted out by security.  Patient states she is waiting in the lobby.  I explained to patient that she can not walk in here, but that I would give the covering physician a call and call her back to advise.  Patient verbalizes understanding.

## 2013-04-24 NOTE — ED Notes (Addendum)
Pt advised that she could be admitted but would have another 4 hours before being given a repeat dose of pain medication. Pt called registration to state that "I am not being treated fairly. They aren't trying to listen to me." Pt began to raise her voice at writer stating "You aren't going to tell me how I feel. I'm in pain and it's my right to be admitted if I feel like I need to be. I'm in pain and you are pushing my buttons." MD was made aware and stated that with pt becoming verbally abusive to staff and aggressive to have security escort pt from the ED.

## 2013-04-24 NOTE — ED Notes (Signed)
Security at bedside with RN and NT to remove IV, pt stating "I am not leaving I am waiting here for my doctor because she is going to be very upset about how I was treated here." Pt advised that she has been evaluated by the MD and discharged. Pt still refusing to leave or allow staff to remove IV.  [employer] off duty requested to bedside.

## 2013-04-24 NOTE — Progress Notes (Signed)
Patient in bathroom from approximately [phone]. Checked on patient at least 5 times during time in bathroom, patient states she is ok and having straining/difficulty passing stool but is able to pass some stool. Instructed patient that I have received orders for medication to include medication to assist with bowel movement. Patient acknowledges and agrees to attempt to increase water intake and try again at a later time to have bowel movement. To continue to monitor.

## 2013-04-24 NOTE — ED Notes (Signed)
[employer] at bedside and pt allowed NTs to remove IV. Pt had paper tape securing IV and plastic tape was placed on gauze.  Pt was asked if she wanted this tape changed to paper tape d/t sensitivity or for comfort and pt stated "Don't touch me again." Pt refusing to change clothes at this time. Security and [employer] still at bedside with pt.

## 2013-04-24 NOTE — ED Notes (Signed)
Bed: ZO10 Expected date: 04/24/13 Expected time: 1:10 AM Means of arrival: Ambulance Comments: 22 yo F  [facility]

## 2013-04-25 DIAGNOSIS — K5900 Constipation, unspecified: Secondary | ICD-10-CM | POA: Diagnosis present

## 2013-04-25 LAB — COMPREHENSIVE METABOLIC PANEL
ALT: 16 U/L (ref 0–35)
AST: 29 U/L (ref 0–37)
Albumin: 3.9 g/dL (ref 3.5–5.2)
CO2: 22 mEq/L (ref 19–32)
Calcium: 8.9 mg/dL (ref 8.4–10.5)
Chloride: 105 mEq/L (ref 96–112)
GFR calc Af Amer: >90 mL/min (ref >90)
GFR calc non Af Amer: >90 mL/min (ref >90)
Sodium: 137 mEq/L (ref 135–145)
Total Bilirubin: 2.6 mg/dL — ABNORMAL HIGH (ref 0.3–1.2)

## 2013-04-25 LAB — CBC WITH DIFFERENTIAL/PLATELET
Basophils Absolute: 0 10*3/uL (ref 0.0–0.1)
Eosinophils Relative: 4 % (ref 0–5)
MCHC: 34.6 g/dL (ref 30.0–36.0)
Monocytes Relative: 10 % (ref 3–12)
Neutrophils Relative %: 64 % (ref 43–77)
Platelets: 448 10*3/uL — ABNORMAL HIGH (ref 150–400)
RBC: 2.76 MIL/uL — ABNORMAL LOW (ref 3.87–5.11)
WBC: 20.5 10*3/uL — ABNORMAL HIGH (ref 4.0–10.5)

## 2013-04-25 MED ORDER — HYDROXYUREA 500 MG PO CAPS
1000.0000 mg | ORAL_CAPSULE | Freq: Every day | ORAL | Status: DC
  Administered 2013-04-26 – 2013-04-28 (×4): 1000 mg via ORAL
  Filled 2013-04-25 (×5): qty 2

## 2013-04-25 MED ORDER — ENOXAPARIN SODIUM 40 MG/0.4ML ~~LOC~~ SOLN
40.0000 mg | SUBCUTANEOUS | Status: DC
  Filled 2013-04-25 (×5): qty 0.4

## 2013-04-25 NOTE — Progress Notes (Signed)
SICKLE CELL SERVICE PROGRESS NOTE  [name] ZOX:[id number] DOB: December 06, 1990 DOA: 04/24/2013 PCP: Nancy A., MD  Assessment/Plan: Active Problems:   Hb-SS disease with crisis   Leukocytosis, unspecified   Unspecified constipation  1. Hb SS with Crisis: Pt states pain currently 7/10 but decreases to 5/10 immediately after PCA dose. She has used 14.62 mg and 34 demands and 19 deliveries. Pt states that she has not used the PCA much overnight. Will continue PCA at current dose. Encourage PCA use and re-assess this afternoon and adjust as necessary. 2. Leukocytosis: No evidence of infection.  3. Sickle Cell Disease: Pt has not been taking Hydrea and Folic Acid consistently. She state that on some weeks she'll do well with compliance and then her like seem to get chaotic and she falls into a pattern of non-compliance.  4. Depression: Pt on Zoloft and has been seeing a counselor however she states that her Medicare is not covering her sessions and she is unable to continue seeing the counselor until the Counselor Scientist, forensic) receives reimbursement.  5. Constipation: Pt had a BM last night. Will continue Senna-S and Miralax PRN.  Code Status: Full Code Family Communication: N/A Disposition Plan: Home in 24-48 hours  Nancy A.  Pager [phone]. If 7PM-7AM, please contact night-coverage.  04/25/2013, 9:07 AM  LOS: 1 day   Brief narrative: Pt was seen in the ED, treated and discharged. She presented to Day hospital for additional treatment as her pain was uncontrolled and she was unable to ambulate. She was treated with weight based re-dosing and PCA however although pain is decreased she is unable to ambulate independently. Pt currently rates pain as 6/10. Pain is localized to back and legs. She is being admitted to the hospital for further treatment of her pain.   Consultants:  None  Procedures:  None  Antibiotics:  None  HPI/Subjective:  Pt  states pain currently 7/10 but decreases to 5/10 immediately after PCA dose. Pain is localized to back and right knee. Pt had a BM last night.  Objective: Filed Vitals:   04/24/13 2053 04/24/13 2107 04/25/13 0100 04/25/13 0810  BP:  107/55    Pulse:  102    Temp:  98.6 F (37 C)    TempSrc:  Oral    Resp: 18 18  18   Height:   5\' 4"  (1.626 m)   Weight:      SpO2:  98%  100%   Weight change:   Intake/Output Summary (Last 24 hours) at 04/25/13 8295 Last data filed at 04/24/13 1729  Gross per 24 hour  Intake 1495.97 ml  Output      0 ml  Net 1495.97 ml    General: Alert, awake, oriented x3, in no acute distress.  HEENT: [place]/AT PEERL, EOMI, anicteric. Heart: Regular rate and rhythm, without murmurs, rubs, gallops.  Lungs: Clear to auscultation, no wheezing or rhonchi noted.  Abdomen: Soft, nontender, nondistended, positive bowel sounds, no masses no hepatosplenomegaly noted.  Neuro: No focal neurological deficits noted cranial nerves II through XII grossly intact. Strength normal in bilateral upper and lower extremities. Musculoskeletal: No warm swelling or erythema around joints. Psychiatric: Patient alert and oriented x3, good insight and cognition, good recent to remote recall. Lymph node survey: No cervical axillary or inguinal lymphadenopathy noted.   Data Reviewed: Basic Metabolic Panel:  Recent Labs Lab 04/24/13 0409 04/25/13 0355  NA 138 137  K 3.3* 4.4  CL 105 105  CO2 24 22  GLUCOSE 92  107*  BUN 10 10  CREATININE 0.73 0.75  CALCIUM 8.6 8.9   Liver Function Tests:  Recent Labs Lab 04/24/13 0409 04/25/13 0355  AST 16 29  ALT 12 16  ALKPHOS 61 57  BILITOT 1.2 2.6*  PROT 6.9 7.1  ALBUMIN 3.7 3.9    Recent Labs Lab 04/24/13 0409  LIPASE 59   No results found for this basename: AMMONIA,  in the last 168 hours CBC:  Recent Labs Lab 04/24/13 0312 04/25/13 0355  WBC 17.8* 20.5*  NEUTROABS  --  13.1*  HGB 9.0* 8.4*  HCT 25.8* 24.3*  MCV 88.4  88.0  PLT 426* 448*    Studies: Dg Chest 2 View  04/24/2013   CLINICAL DATA:  Right chest pain; sickle cell crisis.  EXAM: CHEST  2 VIEW  COMPARISON:  Chest radiograph performed at 11/2011  FINDINGS: The lungs are well-aerated and clear. There is no evidence of focal opacification, pleural effusion or pneumothorax.  The heart is normal in size; the mediastinal contour is within normal limits. No acute osseous abnormalities are seen. Clips are noted within the right upper quadrant, reflecting prior cholecystectomy. H-shaped vertebral bodies reflect the patient's sickle cell disease.  IMPRESSION: No acute cardiopulmonary process seen.   Electronically Signed   By: Roanna Raider M.D.   On: 04/24/2013 03:24   US Abdomen Complete  04/24/2013   CLINICAL DATA:  Right upper quadrant abdominal pain.  EXAM: ULTRASOUND ABDOMEN COMPLETE  COMPARISON:  CT of the abdomen and pelvis performed 06/22/2012, and abdominal ultrasound performed 03/14/2012  FINDINGS: Gallbladder:  Status post cholecystectomy.  No retained stones seen.  Common bile duct:  Diameter: 0.3 cm, within normal limits in caliber.  Liver:  No focal lesion identified. Within normal limits in parenchymal echogenicity.  IVC:  No abnormality visualized.  Pancreas:  Obscured by overlying bowel gas.  Spleen:  Size within normal limits; somewhat echogenic in appearance, with a nodular contour. This is unchanged from the prior CT.  Right Kidney:  Length: 10.6 cm. Echogenicity within normal limits. No mass or hydronephrosis visualized.  Left Kidney:  Length: 10.2 cm. Echogenicity within normal limits. No mass or hydronephrosis visualized.  Abdominal aorta:  No aneurysm visualized.  Other findings:  None.  IMPRESSION: 1. No acute abnormality seen within the abdomen. 2. 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   Electronically Signed   By: Roanna Raider M.D.   On: 04/24/2013 04:51    Scheduled Meds: . folic acid  1 mg Oral Daily  .  HYDROmorphone PCA 0.3 mg/mL   Intravenous Q4H  . ketorolac  30 mg Intravenous Q6H  . potassium chloride  40 mEq Oral Daily  . senna-docusate  1 tablet Oral BID  . sertraline  50 mg Oral Daily   Continuous Infusions: . dextrose 5 % and 0.45% NaCl 125 mL/hr at 04/24/13 1835   Total time spent 40 minutes.

## 2013-04-25 NOTE — Progress Notes (Signed)
Peripherally Inserted Central Catheter/Midline Placement  The IV Nurse has discussed with the patient and/or persons authorized to consent for the patient, the purpose of this procedure and the potential benefits and risks involved with this procedure.  The benefits include less needle sticks, lab draws from the catheter and patient may be discharged home with the catheter.  Risks include, but not limited to, infection, bleeding, blood clot (thrombus formation), and puncture of an artery; nerve damage and irregular heat beat.  Alternatives to this procedure were also discussed.  PICC/Midline Placement Documentation        Stacie Glaze Horton 04/25/2013, 5:45 PM

## 2013-04-25 NOTE — ED Provider Notes (Signed)
CSN: [id number]     Arrival date & time 04/24/13  0125 History   First MD Initiated Contact with Patient 04/24/13 0201     Chief Complaint  Patient presents with  . Sickle Cell Pain Crisis   (Consider location/radiation/quality/duration/timing/severity/associated sxs/prior Treatment) HPI HX per PT - R lateral chest pain x 2 days. Feels like her sickle cell pains, having severe sharp pain despite her home medications called EMS for transport.   No F/C, no cough, no SOB. She denies any rash, trauma, or radiation of pain. No N/V/D.  Past Medical History  Diagnosis Date  . H/O: 1 miscarriage 03/22/2011  . 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 Depression 01/06/2011  . GERD (gastroesophageal reflux disease) 02/17/2011  . Trichotillomania     h/o  . Blood transfusion     "lots"  . Sickle cell anemia with crisis   . Exertional dyspnea     "sometimes"  . Sickle cell anemia   . Headache(784.0)   . Migraines 11/08/11    "@ least twice/month"  . Chronic back pain     "very severe; have knot in my back; from tight muscle; take RX and exercise for it"  . Mood swings 11/08/11    "I go back and forth; real bad"  . Pregnancy   . Blood dyscrasia     SICKLE CELL   Past Surgical History  Procedure Laterality Date  . Cholecystectomy  05/2010  . Dilation and curettage of uterus  02/20/11    S/P miscarriage   Family History  Problem Relation Age of Onset  . Diabetes Mother   . Alcoholism    . Depression    . Hypercholesterolemia    . Hypertension    . Migraines    . Diabetes Maternal Grandmother   . Diabetes Paternal Grandmother    History  Substance Use Topics  . Smoking status: Former Smoker -- 0.25 packs/day for 1 years    Types: Cigarettes    Quit date: 03/25/2013  . Smokeless tobacco: Never Used  . Alcohol Use: Yes     Comment: pt states she quit marijuan in May 2013. Rare ETOH, + cigarettes.  She is enrolled in school   OB History   Grav Para Term Preterm Abortions TAB SAB Ect Mult  Living   2    1  1         Obstetric Comments   Miscarried in October 2012 at about 7 weeks     Review of Systems  Constitutional: Negative for fever and chills.  Respiratory: Negative for shortness of breath.   Cardiovascular: Positive for chest pain.  Gastrointestinal: Positive for abdominal pain.  Genitourinary: Negative for dysuria.  Musculoskeletal: Negative for back pain, neck pain and neck stiffness.  Skin: Negative for rash.  Neurological: Negative for syncope, weakness and headaches.  All other systems reviewed and are negative.    Allergies  Carrot oil and Latex  Home Medications  No current outpatient prescriptions on file. LMP 04/06/2013 Physical Exam  Constitutional: She is oriented to person, place, and time. She appears well-developed and well-nourished.  HENT:  Head: Normocephalic and atraumatic.  Eyes: EOM are normal. Pupils are equal, round, and reactive to light.  Neck: Neck supple.  Cardiovascular: Regular rhythm and intact distal pulses.   Pulmonary/Chest: Effort normal. No respiratory distress.  TTP R lateral lower chest wall  Abdominal: Soft. Bowel sounds are normal. She exhibits no distension. There is no rebound and no guarding.  Some RUQ tenderness without ABD  tenderness sotherwise  Musculoskeletal: Normal range of motion. She exhibits no edema and no tenderness.  Neurological: She is alert and oriented to person, place, and time. No cranial nerve deficit.  Skin: Skin is warm and dry.    ED Course  Procedures (including critical care time) Labs Review Labs Reviewed  CBC - Abnormal; Notable for the following:    WBC 17.8 (*)    RBC 2.92 (*)    Hemoglobin 9.0 (*)    HCT 25.8 (*)    RDW 16.0 (*)    Platelets 426 (*)    All other components within normal limits  RETICULOCYTES - Abnormal; Notable for the following:    Retic Ct Pct 12.1 (*)    RBC. 2.92 (*)    Retic Count, Manual 353.3 (*)    All other components within normal limits   COMPREHENSIVE METABOLIC PANEL - Abnormal; Notable for the following:    Potassium 3.3 (*)    All other components within normal limits  LIPASE, BLOOD   Imaging Review Dg Chest 2 View  04/24/2013   CLINICAL DATA:  Right chest pain; sickle cell crisis.  EXAM: CHEST  2 VIEW  COMPARISON:  Chest radiograph performed at 11/2011  FINDINGS: The lungs are well-aerated and clear. There is no evidence of focal opacification, pleural effusion or pneumothorax.  The heart is normal in size; the mediastinal contour is within normal limits. No acute osseous abnormalities are seen. Clips are noted within the right upper quadrant, reflecting prior cholecystectomy. H-shaped vertebral bodies reflect the patient's sickle cell disease.  IMPRESSION: No acute cardiopulmonary process seen.   Electronically Signed   By: Roanna Raider M.D.   On: 04/24/2013 03:24   US Abdomen Complete  04/24/2013   CLINICAL DATA:  Right upper quadrant abdominal pain.  EXAM: ULTRASOUND ABDOMEN COMPLETE  COMPARISON:  CT of the abdomen and pelvis performed 06/22/2012, and abdominal ultrasound performed 03/14/2012  FINDINGS: Gallbladder:  Status post cholecystectomy.  No retained stones seen.  Common bile duct:  Diameter: 0.3 cm, within normal limits in caliber.  Liver:  No focal lesion identified. Within normal limits in parenchymal echogenicity.  IVC:  No abnormality visualized.  Pancreas:  Obscured by overlying bowel gas.  Spleen:  Size within normal limits; somewhat echogenic in appearance, with a nodular contour. This is unchanged from the prior CT.  Right Kidney:  Length: 10.6 cm. Echogenicity within normal limits. No mass or hydronephrosis visualized.  Left Kidney:  Length: 10.2 cm. Echogenicity within normal limits. No mass or hydronephrosis visualized.  Abdominal aorta:  No aneurysm visualized.  Other findings:  None.  IMPRESSION: 1. No acute abnormality seen within the abdomen. 2. Incidental note of chronic nodular transformation of the  spleen.   Electronically Signed   By: Roanna Raider M.D.   On: 04/24/2013 04:51   PT requesting Dilaudid and then prefers to be discharged home, she was offered Toradol but states she is allergic.  On recheck, is requesting 2mg  IV Dilaudid and then feels like she could be discharged.  On recheck, still in pain and I offered admit at this time and she declined.  On recheck is again requesting 2mg  IV dilaudid and I had a discussion with her regarding either admission if she continues to require pain medication or discharge if she feels like her pain is controlled. At that time she opted to be discharged.  When RN presented d/c papers PT became upset and verbally abusive to staff. She required security escort.  MDM   1. Sickle cell pain crisis    IVFs, O2, IV narcotics pain control Labs and imaging obtained and reviewed as above.     Sunnie Nielsen, MD 04/25/13 [phone]

## 2013-04-25 NOTE — Progress Notes (Signed)
PCA cut off due to patient not being able to stay awake long enough to even take her 1000 medications. Will resume when patient more awake. Will continue to monitor.

## 2013-04-25 NOTE — Progress Notes (Signed)
Picked up patient at 2230. Poor iv access, left thumb .Had to lock PCA  due to constant beeping of pump and going into room and finding the PCA turned off. Pt states she did not touch it, but after turning the lock on, went back into room to fix iv pump and finding the PCA on pause.  She swears she did not touch it. Explained to pt that messing with PCA is very dangerous and could cause harm.  Pt needs a central line. The pump goes off every few minutes due to poor IV access.

## 2013-04-25 NOTE — Progress Notes (Signed)
Attempted left upper arm PICC x3, unsuccessful. Unable to thread guidewire. Tolerated well. Dr. Ashley Royalty aware. Adjusted PIV placed this am, great blood return, flushes well.

## 2013-04-25 NOTE — Progress Notes (Signed)
Patient ID: [name], female   DOB: 1990/12/11, 22 y.o.   MRN: [id number] I spoke with nurse at 4:45pm who informed mr that pt's PCA discontinued at 11:00pm. Pt has not received pain medication since 11:00am. PT reports that she has been sleepy for most of the day as she had little sleep last night because of the pain. Per her nurse she has had no decrease in respirations or Hypoxia. She was just sleepy.She rates her pain as 6/10 and localized to her right knee and ankle. Pt's gait is still impaired.   FOCUSED EXAMINATION: GEN: Pt awake and alert and in NAD. Neuro: Alert and oriented x 3 Lungs: CTA COR: S1 S2 normal.  Musculoskeletal: Pt has no warmth or swelling around the joint. Pt able to get out of bed with minimal assistance and ambulate using her IV stand as support. She still has an impaired gait secondary to pain.  A/P: Continue PCA at current dose. Re-assess in the morning.

## 2013-04-26 ENCOUNTER — Inpatient Hospital Stay (HOSPITAL_COMMUNITY): Payer: Medicare Other

## 2013-04-26 ENCOUNTER — Telehealth: Payer: Self-pay | Admitting: Internal Medicine

## 2013-04-26 ENCOUNTER — Other Ambulatory Visit: Payer: Self-pay | Admitting: Internal Medicine

## 2013-04-26 DIAGNOSIS — F329 Major depressive disorder, single episode, unspecified: Secondary | ICD-10-CM

## 2013-04-26 DIAGNOSIS — D649 Anemia, unspecified: Secondary | ICD-10-CM

## 2013-04-26 DIAGNOSIS — K219 Gastro-esophageal reflux disease without esophagitis: Secondary | ICD-10-CM

## 2013-04-26 DIAGNOSIS — D571 Sickle-cell disease without crisis: Secondary | ICD-10-CM

## 2013-04-26 DIAGNOSIS — F17200 Nicotine dependence, unspecified, uncomplicated: Secondary | ICD-10-CM

## 2013-04-26 MED ORDER — OXYCODONE HCL 10 MG PO TABS: 15.0000 mg | ORAL_TABLET | Freq: Three times a day (TID) | ORAL | Status: DC | PRN

## 2013-04-26 MED ORDER — HEPARIN SOD (PORK) LOCK FLUSH 100 UNIT/ML IV SOLN
INTRAVENOUS | Status: AC
  Filled 2013-04-26: qty 5

## 2013-04-26 MED ORDER — LIDOCAINE HCL 1 % IJ SOLN
INTRAMUSCULAR | Status: AC
  Filled 2013-04-26: qty 20

## 2013-04-26 MED ORDER — OXYCODONE HCL ER 20 MG PO T12A
20.0000 mg | EXTENDED_RELEASE_TABLET | Freq: Two times a day (BID) | ORAL | Status: AC
  Administered 2013-04-26: 20 mg via ORAL
  Filled 2013-04-26: qty 1

## 2013-04-26 MED ORDER — OXYCODONE HCL 5 MG PO TABS
10.0000 mg | ORAL_TABLET | ORAL | Status: AC | PRN
  Administered 2013-04-26 – 2013-04-29 (×3): 10 mg via ORAL
  Filled 2013-04-26 (×3): qty 2

## 2013-04-26 NOTE — Progress Notes (Signed)
Prescription written for Oxycodone 10 mg # 90 tabs  To be taken 1.5 tabs q 4 hours as needed.

## 2013-04-26 NOTE — Progress Notes (Signed)
Sickle Cell Service PROGRESS NOTE  [name] WUX:[id number] DOB: 03-02-1991 DOA: 04/24/2013 PCP: MATTHEWS,MICHELLE A., MD  Assessment/Plan: 1. Acute SS Disease Pain Crisis - I have called and requested for IR to place a PICC line ASAP to establish IV access so that the dilaudid PCA can be resumed.  In addition, I spoke with the pharmacist on the floor and have ordered that patient receive oxycodone 20 mg po now with an additional order for breakthrough pain control with Oxy IR every 3 hours.  I asked the IV nursing team to reasses patient to see if the can get a peripheral line established so that her PCA can be used immediately. Resume Crisis therapy per protocol once IV access established.  Continue hydroxyurea. 2. Hypokalemia - repleted, will follow 3. Leukocytosis - no overt signs of infection, likely reactive secondary to #1.  4. Anemia - hemoglobin holding well, following 5. Depression - stable, continue sertraline 50 mg po daily.  6. GERD - currently stable 7. Constipation - continue Senokot and polyethylene glycol 8. RUE IV infiltration - IV removed and IV fluids discontinued, plans to start new IV and place PICC line initiated as above  Code Status: Full  Family Communication: none at bedside Disposition Plan: Not ready for discharge   HPI/Subjective: Pt's IV infiltrated in RUE and now has no IV access, Pt reporting persistent pain and discomfort in neck and back and right knee 8/10.  Pt requesting that IV team attempt once again to get an IV established so that the PCA can be restarted. Pt denies fever, chills, nausea, emesis, chest pain and SOB.    Objective: Filed Vitals:   04/26/13 0759  BP:   Pulse:   Temp:   Resp: 16    Intake/Output Summary (Last 24 hours) at 04/26/13 0854 Last data filed at 04/26/13 0600  Gross per 24 hour  Intake   3480 ml  Output      0 ml  Net   3480 ml   Filed Weights   04/24/13 1750  Weight: 183 lb 13.8 oz (83.4 kg)    Exam:   General:  Awake, alert, no apparent distress, but appears uncomfortable in bed  Cardiovascular: normal s1, s2 sounds   Respiratory: BBS clear to ausculation  Abdomen: soft, nondistended, no masses  Musculoskeletal: RUE swollen at area of IV infiltration, painful right knee, mild edema noted, full passive ROM    Data Reviewed: Basic Metabolic Panel:  Recent Labs Lab 04/24/13 0409 04/25/13 0355  NA 138 137  K 3.3* 4.4  CL 105 105  CO2 24 22  GLUCOSE 92 107*  BUN 10 10  CREATININE 0.73 0.75  CALCIUM 8.6 8.9   Liver Function Tests:  Recent Labs Lab 04/24/13 0409 04/25/13 0355  AST 16 29  ALT 12 16  ALKPHOS 61 57  BILITOT 1.2 2.6*  PROT 6.9 7.1  ALBUMIN 3.7 3.9    Recent Labs Lab 04/24/13 0409  LIPASE 59   No results found for this basename: AMMONIA,  in the last 168 hours CBC:  Recent Labs Lab 04/24/13 0312 04/25/13 0355  WBC 17.8* 20.5*  NEUTROABS  --  13.1*  HGB 9.0* 8.4*  HCT 25.8* 24.3*  MCV 88.4 88.0  PLT 426* 448*   Cardiac Enzymes: No results found for this basename: CKTOTAL, CKMB, CKMBINDEX, TROPONINI,  in the last 168 hours BNP (last 3 results) No results found for this basename: PROBNP,  in the last 8760 hours CBG: No results found  for this basename: GLUCAP,  in the last 168 hours  No results found for this or any previous visit (from the past 240 hour(s)).   Studies: No results found.  Scheduled Meds: . enoxaparin (LOVENOX) injection  40 mg Subcutaneous Q24H  . folic acid  1 mg Oral Daily  . HYDROmorphone PCA 0.3 mg/mL   Intravenous Q4H  . hydroxyurea  1,000 mg Oral QHS  . ketorolac  30 mg Intravenous Q6H  . OxyCODONE  20 mg Oral Q12H  . potassium chloride  40 mEq Oral Daily  . senna-docusate  1 tablet Oral BID  . sertraline  50 mg Oral Daily   Continuous Infusions: . dextrose 5 % and 0.45% NaCl 125 mL/hr at 04/25/13 2041   Active Problems:   Hb-SS disease with crisis   Leukocytosis, unspecified    Unspecified constipation  Standley Dakins MD  [facility] Pager [phone]. If 7PM-7AM, please contact night-coverage at [email or web address], password [facility] 04/26/2013, 8:54 AM  LOS: 2 days

## 2013-04-27 DIAGNOSIS — M549 Dorsalgia, unspecified: Secondary | ICD-10-CM

## 2013-04-27 LAB — COMPREHENSIVE METABOLIC PANEL
AST: 19 U/L (ref 0–37)
Albumin: 3.5 g/dL (ref 3.5–5.2)
Alkaline Phosphatase: 58 U/L (ref 39–117)
Calcium: 8.7 mg/dL (ref 8.4–10.5)
Chloride: 102 mEq/L (ref 96–112)
Creatinine, Ser: 0.63 mg/dL (ref 0.50–1.10)
GFR calc non Af Amer: >90 mL/min (ref >90)
Glucose, Bld: 109 mg/dL — ABNORMAL HIGH (ref 70–99)
Sodium: 135 mEq/L (ref 135–145)
Total Bilirubin: 2.6 mg/dL — ABNORMAL HIGH (ref 0.3–1.2)

## 2013-04-27 LAB — CBC
Hemoglobin: 7.8 g/dL — ABNORMAL LOW (ref 12.0–15.0)
MCH: 31.5 pg (ref 26.0–34.0)
MCHC: 35.3 g/dL (ref 30.0–36.0)
MCV: 89.1 fL (ref 78.0–100.0)
Platelets: 412 10*3/uL — ABNORMAL HIGH (ref 150–400)

## 2013-04-27 NOTE — Plan of Care (Signed)
Problem: Phase I Progression Outcomes Goal: Bowel Movement At Least Every 3 Days Outcome: Completed/Met Date Met:  04/27/13 Patient reports having multiple stools today 12/12

## 2013-04-27 NOTE — Progress Notes (Signed)
SICKLE CELL SERVICE PROGRESS NOTE  [name] WJX:[id number] DOB: [birth year] DOA: 04/24/2013 PCP: MATTHEWS,MICHELLE A., MD  Assessment/Plan: 1. Acute SS Disease Pain Crisis - Pt reports that her pain is much better controlled now that she has PICC line and PCA is functioning well.  She had 29 demands and 19.36 mg of total medication delivered since 5:30 pm.  Continue current therapy.  Encouraged ambulation and Incentive spirometry. Continue hydroxyurea, heating pad, ketorolac.  2. Hypokalemia - repleted.   3. Leukocytosis - no overt signs of infection, likely reactive secondary to #1 and PICC line placement.  4. Anemia - hemoglobin at 8.   5. Depression - stable, continue sertraline 50 mg po daily.  6. GERD - currently stable 7. Constipation - Pt having BMs.  Continue Senokot and polyethylene glycol 8. RUE IV infiltration - IV removed and IV fluids discontinued, elevating extremity, plans to start new, PICC line initiated as above.  Code Status: Full  Family Communication: none at bedside  Disposition Plan: Not ready for discharge   HPI/Subjective: Pt reports that pain is much better controlled with PICC line and dilaudid PCA, she is still having significant back pain.  No nausea or emesis.  She has had BMs yesterday.  She is starting to ambulate but having pain in right leg. Overall she is reporting that she is improving.   Objective: Filed Vitals:   04/27/13 0643  BP:   Pulse: 93  Temp:   Resp: 11    Intake/Output Summary (Last 24 hours) at 04/27/13 0735 Last data filed at 04/27/13 0615  Gross per 24 hour  Intake 2635.9 ml  Output    450 ml  Net 2185.9 ml   Filed Weights   04/24/13 1750  Weight: 183 lb 13.8 oz (83.4 kg)   Exam:   General:  Awake, alert, no distress  Cardiovascular: normal s1, s2 sounds   Respiratory: BBS clear   Abdomen: soft, nondistended, nontender, no masses  Musculoskeletal: muscular back pain with palpation, no edema or cyanosis    Data Reviewed: Basic Metabolic Panel:  Recent Labs Lab 04/24/13 0409 04/25/13 0355 04/27/13 0615  NA 138 137 135  K 3.3* 4.4 3.8  CL 105 105 102  CO2 24 22 26   GLUCOSE 92 107* 109*  BUN 10 10 8   CREATININE 0.73 0.75 0.63  CALCIUM 8.6 8.9 8.7   Liver Function Tests:  Recent Labs Lab 04/24/13 0409 04/25/13 0355 04/27/13 0615  AST 16 29 19   ALT 12 16 14   ALKPHOS 61 57 58  BILITOT 1.2 2.6* 2.6*  PROT 6.9 7.1 6.5  ALBUMIN 3.7 3.9 3.5    Recent Labs Lab 04/24/13 0409  LIPASE 59   No results found for this basename: AMMONIA,  in the last 168 hours CBC:  Recent Labs Lab 04/24/13 0312 04/25/13 0355 04/27/13 0615  WBC 17.8* 20.5* 22.3*  NEUTROABS  --  13.1*  --   HGB 9.0* 8.4* 7.8*  HCT 25.8* 24.3* 22.1*  MCV 88.4 88.0 89.1  PLT 426* 448* 412*   Cardiac Enzymes: No results found for this basename: CKTOTAL, CKMB, CKMBINDEX, TROPONINI,  in the last 168 hours BNP (last 3 results) No results found for this basename: PROBNP,  in the last 8760 hours CBG: No results found for this basename: GLUCAP,  in the last 168 hours  No results found for this or any previous visit (from the past 240 hour(s)).   Studies: Ir US Guide Vasc Access Right  04/26/2013  CLINICAL DATA:  Sickle cell anemia crisis and need for IV access.  EXAM: POWER PICC LINE PLACEMENT WITH ULTRASOUND AND FLUOROSCOPIC GUIDANCE  FLUOROSCOPY TIME:  6 seconds.  PROCEDURE: The patient was advised of the possible risks and complications and agreed to undergo the procedure. The patient was then brought to the angiographic suite for the procedure.  The right arm was prepped with chlorhexidine, draped in the usual sterile fashion using maximum barrier technique (cap and mask, sterile gown, sterile gloves, large sterile sheet, hand hygiene and cutaneous antisepsis) and infiltrated locally with 1% Lidocaine.  Ultrasound demonstrated patency of the right brachial vein, and this was documented with an image. Under  real-time ultrasound guidance, this vein was accessed with a 21 gauge micropuncture needle and image documentation was performed. A 0.018 wire was introduced in to the vein. Over this, a 37 cm, 5.0 [place] dual lumen power injectable PICC was advanced to the lower SVC/right atrial junction. Fluoroscopy during the procedure and fluoro spot radiograph confirms appropriate catheter position. The catheter was flushed and covered with a sterile dressing.  Complications: None  IMPRESSION: Successful right arm power injectable PICC line placement with ultrasound and fluoroscopic guidance. The catheter is ready for use.   Electronically Signed   By: Irish Lack M.D.   On: 04/26/2013 17:56   Ir Fluoro Guide Cv Midline Picc Left  04/26/2013   CLINICAL DATA:  Sickle cell anemia crisis and need for IV access.  EXAM: POWER PICC LINE PLACEMENT WITH ULTRASOUND AND FLUOROSCOPIC GUIDANCE  FLUOROSCOPY TIME:  6 seconds.  PROCEDURE: The patient was advised of the possible risks and complications and agreed to undergo the procedure. The patient was then brought to the angiographic suite for the procedure.  The right arm was prepped with chlorhexidine, draped in the usual sterile fashion using maximum barrier technique (cap and mask, sterile gown, sterile gloves, large sterile sheet, hand hygiene and cutaneous antisepsis) and infiltrated locally with 1% Lidocaine.  Ultrasound demonstrated patency of the right brachial vein, and this was documented with an image. Under real-time ultrasound guidance, this vein was accessed with a 21 gauge micropuncture needle and image documentation was performed. A 0.018 wire was introduced in to the vein. Over this, a 37 cm, 5.0 [place] dual lumen power injectable PICC was advanced to the lower SVC/right atrial junction. Fluoroscopy during the procedure and fluoro spot radiograph confirms appropriate catheter position. The catheter was flushed and covered with a sterile dressing.  Complications:  None  IMPRESSION: Successful right arm power injectable PICC line placement with ultrasound and fluoroscopic guidance. The catheter is ready for use.   Electronically Signed   By: Irish Lack M.D.   On: 04/26/2013 17:56    Scheduled Meds: . enoxaparin (LOVENOX) injection  40 mg Subcutaneous Q24H  . folic acid  1 mg Oral Daily  . HYDROmorphone PCA 0.3 mg/mL   Intravenous Q4H  . hydroxyurea  1,000 mg Oral QHS  . ketorolac  30 mg Intravenous Q6H  . potassium chloride  40 mEq Oral Daily  . senna-docusate  1 tablet Oral BID  . sertraline  50 mg Oral Daily   Continuous Infusions: . dextrose 5 % and 0.45% NaCl 1,000 mL (04/27/13 0230)   Active Problems:   Hb-SS disease with crisis   Leukocytosis, unspecified   Unspecified constipation  Emilene Roma [employer] Pager [phone]. If 7PM-7AM, please contact night-coverage at [email or web address], password [facility] 04/27/2013, 7:35 AM  LOS: 3 days

## 2013-04-28 ENCOUNTER — Inpatient Hospital Stay (HOSPITAL_COMMUNITY): Payer: Medicare Other

## 2013-04-28 DIAGNOSIS — R000 Tachycardia, unspecified: Secondary | ICD-10-CM

## 2013-04-28 DIAGNOSIS — G8929 Other chronic pain: Secondary | ICD-10-CM

## 2013-04-28 LAB — CBC
MCH: 31.6 pg (ref 26.0–34.0)
MCHC: 34.9 g/dL (ref 30.0–36.0)
MCV: 90.5 fL (ref 78.0–100.0)
Platelets: 438 10*3/uL — ABNORMAL HIGH (ref 150–400)
RBC: 2.53 MIL/uL — ABNORMAL LOW (ref 3.87–5.11)
RDW: 18.5 % — ABNORMAL HIGH (ref 11.5–15.5)
WBC: 26 10*3/uL — ABNORMAL HIGH (ref 4.0–10.5)

## 2013-04-28 LAB — URINALYSIS, ROUTINE W REFLEX MICROSCOPIC
Bilirubin Urine: NEGATIVE
Hgb urine dipstick: NEGATIVE
Ketones, ur: NEGATIVE mg/dL
Nitrite: NEGATIVE
Protein, ur: NEGATIVE mg/dL
Urobilinogen, UA: 1 mg/dL (ref 0.0–1.0)
pH: 8 (ref 5.0–8.0)

## 2013-04-28 MED ORDER — PANTOPRAZOLE SODIUM 40 MG PO TBEC
40.0000 mg | DELAYED_RELEASE_TABLET | Freq: Every day | ORAL | Status: DC
  Administered 2013-04-28: 40 mg via ORAL
  Filled 2013-04-28 (×2): qty 1

## 2013-04-28 NOTE — Plan of Care (Signed)
Problem: Phase II Progression Outcomes Goal: Pain at, < goal with appropriate interventions Outcome: Progressing Patient rating pain 5/10

## 2013-04-28 NOTE — Plan of Care (Signed)
Problem: Phase I Progression Outcomes Goal: Pulmonary Hygiene as Indicated (Sickle Cell) Outcome: Completed/Met Date Met:  04/28/13 Patient using IS

## 2013-04-28 NOTE — Progress Notes (Signed)
SICKLE CELL SERVICE PROGRESS NOTE  [name]:[id number] DOB: January 21, 1991 DOA: 04/24/2013 PCP: MATTHEWS,MICHELLE A., MD  Assessment/Plan: 1. Acute SS Disease Pain Crisis - Pt reports that her pain is improving, pt received 28.33 mg dilaudid with 41 demands.  PICC line and PCA is functioning well.  Continue current therapy at this time, likely will start de-escalation of IV meds transitioning to oral pain meds tomorrow. Encouraged ambulation, PT eval requested and encouraged Incentive spirometry. Continue hydroxyurea, heating pad, ketorolac (to be stopped after 5 days).  2. Hypokalemia - repleted.  Following.  3. Leukocytosis - likely reactive secondary to #1 but may be multifactorial, will check UA, CXR, XR sinus. Pt has had some mild sinus drainage and nosebleed.  4. Anemia - hemoglobin holding stable at 8.  5. Depression - stable, continue sertraline 50 mg po daily.  6. GERD - currently stable.  7. Constipation - Pt having BMs. Continue Senokot and polyethylene glycol 8. RUE IV infiltration - IV removed and IV fluids discontinued, elevating extremity, PICC line. 9. Former Smoker - counseled patient to not start using tobacco again   Code Status: Full  Family Communication: none at bedside  Disposition Plan: Not ready for discharge but anticipate home soon (1-2 days with close outpatient followup)  HPI/Subjective: Pt reports that her back pain is worse in the morning.  Overall she is reporting that she is improving.  She is still using her PCA often with 41 demands.  She is reporting that she wants to try ambulating in the halls today.  She had a nosebleed and reports some sinus drainage.  She denies fever and chills.  She reports no cough or chest pain.  No SOB.   Objective: Filed Vitals:   04/28/13 0741  BP:   Pulse:   Temp:   Resp: 11    Intake/Output Summary (Last 24 hours) at 04/28/13 0812 Last data filed at 04/28/13 0453  Gross per 24 hour  Intake 3298.41 ml   Output    650 ml  Net 2648.41 ml   Filed Weights   04/24/13 1750  Weight: 183 lb 13.8 oz (83.4 kg)    Exam:   General:  Awake, alert, no apparent distress, cooperative  Cardiovascular: normal s1, s2 sounds   Respiratory: BBS clear to auscultation  Abdomen: soft, nondistended, nontender, no masses  Musculoskeletal: back pain with palpation, less edema RUE (from IV infiltration)   Data Reviewed: Basic Metabolic Panel:  Recent Labs Lab 04/24/13 0409 04/25/13 0355 04/27/13 0615  NA 138 137 135  K 3.3* 4.4 3.8  CL 105 105 102  CO2 24 22 26   GLUCOSE 92 107* 109*  BUN 10 10 8   CREATININE 0.73 0.75 0.63  CALCIUM 8.6 8.9 8.7   Liver Function Tests:  Recent Labs Lab 04/24/13 0409 04/25/13 0355 04/27/13 0615  AST 16 29 19   ALT 12 16 14   ALKPHOS 61 57 58  BILITOT 1.2 2.6* 2.6*  PROT 6.9 7.1 6.5  ALBUMIN 3.7 3.9 3.5    Recent Labs Lab 04/24/13 0409  LIPASE 59   No results found for this basename: AMMONIA,  in the last 168 hours CBC:  Recent Labs Lab 04/24/13 0312 04/25/13 0355 04/27/13 0615 04/28/13 0450  WBC 17.8* 20.5* 22.3* 26.0*  NEUTROABS  --  13.1*  --   --   HGB 9.0* 8.4* 7.8* 8.0*  HCT 25.8* 24.3* 22.1* 22.9*  MCV 88.4 88.0 89.1 90.5  PLT 426* 448* 412* 438*   Cardiac Enzymes:  No results found for this basename: CKTOTAL, CKMB, CKMBINDEX, TROPONINI,  in the last 168 hours BNP (last 3 results) No results found for this basename: PROBNP,  in the last 8760 hours CBG: No results found for this basename: GLUCAP,  in the last 168 hours  No results found for this or any previous visit (from the past 240 hour(s)).   Studies: Ir US Guide Vasc Access Right  04/26/2013   CLINICAL DATA:  Sickle cell anemia crisis and need for IV access.  EXAM: POWER PICC LINE PLACEMENT WITH ULTRASOUND AND FLUOROSCOPIC GUIDANCE  FLUOROSCOPY TIME:  6 seconds.  PROCEDURE: The patient was advised of the possible risks and complications and agreed to undergo the  procedure. The patient was then brought to the angiographic suite for the procedure.  The right arm was prepped with chlorhexidine, draped in the usual sterile fashion using maximum barrier technique (cap and mask, sterile gown, sterile gloves, large sterile sheet, hand hygiene and cutaneous antisepsis) and infiltrated locally with 1% Lidocaine.  Ultrasound demonstrated patency of the right brachial vein, and this was documented with an image. Under real-time ultrasound guidance, this vein was accessed with a 21 gauge micropuncture needle and image documentation was performed. A 0.018 wire was introduced in to the vein. Over this, a 37 cm, 5.0 [place] dual lumen power injectable PICC was advanced to the lower SVC/right atrial junction. Fluoroscopy during the procedure and fluoro spot radiograph confirms appropriate catheter position. The catheter was flushed and covered with a sterile dressing.  Complications: None  IMPRESSION: Successful right arm power injectable PICC line placement with ultrasound and fluoroscopic guidance. The catheter is ready for use.   Electronically Signed   By: Irish Lack M.D.   On: 04/26/2013 17:56   Ir Fluoro Guide Cv Midline Picc Left  04/26/2013   CLINICAL DATA:  Sickle cell anemia crisis and need for IV access.  EXAM: POWER PICC LINE PLACEMENT WITH ULTRASOUND AND FLUOROSCOPIC GUIDANCE  FLUOROSCOPY TIME:  6 seconds.  PROCEDURE: The patient was advised of the possible risks and complications and agreed to undergo the procedure. The patient was then brought to the angiographic suite for the procedure.  The right arm was prepped with chlorhexidine, draped in the usual sterile fashion using maximum barrier technique (cap and mask, sterile gown, sterile gloves, large sterile sheet, hand hygiene and cutaneous antisepsis) and infiltrated locally with 1% Lidocaine.  Ultrasound demonstrated patency of the right brachial vein, and this was documented with an image. Under real-time  ultrasound guidance, this vein was accessed with a 21 gauge micropuncture needle and image documentation was performed. A 0.018 wire was introduced in to the vein. Over this, a 37 cm, 5.0 [place] dual lumen power injectable PICC was advanced to the lower SVC/right atrial junction. Fluoroscopy during the procedure and fluoro spot radiograph confirms appropriate catheter position. The catheter was flushed and covered with a sterile dressing.  Complications: None  IMPRESSION: Successful right arm power injectable PICC line placement with ultrasound and fluoroscopic guidance. The catheter is ready for use.   Electronically Signed   By: Irish Lack M.D.   On: 04/26/2013 17:56    Scheduled Meds: . enoxaparin (LOVENOX) injection  40 mg Subcutaneous Q24H  . folic acid  1 mg Oral Daily  . HYDROmorphone PCA 0.3 mg/mL   Intravenous Q4H  . hydroxyurea  1,000 mg Oral QHS  . ketorolac  30 mg Intravenous Q6H  . potassium chloride  40 mEq Oral Daily  . senna-docusate  1 tablet Oral BID  . sertraline  50 mg Oral Daily   Continuous Infusions: . dextrose 5 % and 0.45% NaCl 100 mL/hr at 04/27/13 2219    Active Problems:   Hb-SS disease with crisis   Leukocytosis, unspecified   Unspecified constipation   Analea Muller [employer] Pager [phone]. If 7PM-7AM, please contact night-coverage at [email or web address], password [facility] 04/28/2013, 8:12 AM  LOS: 4 days

## 2013-04-28 NOTE — Progress Notes (Signed)
Patient c/o having a nosebleed "a little while ago". One tissue in trash with blood on it.

## 2013-04-29 LAB — COMPREHENSIVE METABOLIC PANEL
ALT: 14 U/L (ref 0–35)
AST: 20 U/L (ref 0–37)
BUN: 10 mg/dL (ref 6–23)
Calcium: 9.1 mg/dL (ref 8.4–10.5)
GFR calc Af Amer: >90 mL/min (ref >90)
Glucose, Bld: 105 mg/dL — ABNORMAL HIGH (ref 70–99)
Sodium: 137 mEq/L (ref 135–145)
Total Protein: 7.4 g/dL (ref 6.0–8.3)

## 2013-04-29 LAB — CBC
Hemoglobin: 7.6 g/dL — ABNORMAL LOW (ref 12.0–15.0)
MCH: 31 pg (ref 26.0–34.0)
MCHC: 34.4 g/dL (ref 30.0–36.0)
Platelets: 430 10*3/uL — ABNORMAL HIGH (ref 150–400)

## 2013-04-29 NOTE — Progress Notes (Signed)
PT Cancellation Note  Patient Details Name: MARGUERITA STAPP MRN: [id number] DOB: [birth year]   Cancelled Treatment:    Reason Eval/Treat Not Completed: PT screened, no needs identified, will sign off . Pt is being DC'd.   Rada Hay 04/29/2013, 3:35 PM Blanchard Kelch PT [phone]

## 2013-04-29 NOTE — Discharge Summary (Signed)
Sickle Cell Service Discharge Summary   Patient ID: [name] MRN: [id number] DOB/AGE: [birth year] 21 y.o.  Admit date: 04/24/2013 Discharge date: 04/29/2013  Primary Care Physician:  MATTHEWS,MICHELLE A., MD  Admission Diagnoses:  Active Problems:   Hb-SS disease with crisis   Leukocytosis, unspecified   Unspecified constipation  Discharge Diagnoses:   Patient Active Problem List   Diagnosis Date Noted  . Unspecified constipation 04/25/2013  . 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 Chronic pain 02/26/2013  . Immunization due 02/26/2013  . Protein-calorie malnutrition, severe 02/11/2013  . Streptococcal sore throat 12/31/2012    Class: Acute  . Fever, unspecified 10/06/2012  . Anemia 10/05/2012  . Leukocytosis, unspecified 10/05/2012  . 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 Sickle cell anemia with crisis   . Chronic back pain   . Leukocytosis 03/31/2012  . 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 Sickle cell pain crisis 03/29/2012  . Headache 02/13/2012  . Nausea & vomiting 02/13/2012  . Tachycardia 11/22/2011  . Right thigh pain 11/08/2011  . Mood swings 11/08/2011  . Migraines 11/08/2011  . Vaso-occlusive sickle cell crisis 10/08/2011  . Overweight 05/24/2011  . Stress 04/29/2011  . GERD (gastroesophageal reflux disease) 02/17/2011  . Depression 01/06/2011  . Back pain 09/17/2010  . Active smoker 08/09/2010  . 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 SICKLE CELL ANEMIA 01/08/2009  . 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     hydroxyurea 500 MG capsule  Commonly known as:  HYDREA  Take 2 capsules (1,000 mg total) by mouth at bedtime. May take with food to minimize GI side effects.     ondansetron 4 MG tablet  Commonly known as:  ZOFRAN  Take 1 tablet (4 mg total) by  mouth every 6 (six) hours.     Oxycodone HCl 10 MG Tabs  Take 1.5 tablets (15 mg total) by mouth 3 (three) times daily as needed.     polyethylene glycol packet  Commonly known as:  MIRALAX / GLYCOLAX  Take 17 g by mouth daily as needed (for constipation).     prenatal multivitamin Tabs tablet  Take 1 tablet by mouth daily at 12 noon.     sertraline 50 MG tablet  Commonly known as:  ZOLOFT  Take 1 tablet (50 mg total) by mouth daily.        Significant Diagnostic Studies:  Dg Sinuses Complete  04/28/2013   CLINICAL DATA:  Sickle cell disease. Cough, chest congestion, shortness of breath, leukocytosis. Sinonasal drainage.  EXAM: PARANASAL SINUSES - COMPLETE 3 + VIEW  COMPARISON:  None.  FINDINGS: Paranasal sinuses well aerated. No visible mucous retention cysts or polyps. No visible mucosal thickening. No air-fluid levels. Bony nasal septum midline. Visualized facial bones unremarkable.  IMPRESSION: Normal examination.   Electronically Signed   By: Hulan Saas M.D.   On: 04/28/2013 09:11   Dg Chest 2 View  04/28/2013   CLINICAL DATA:  Sickle cell disease. Cough, chest congestion, shortness of breath, and leukocytosis.  EXAM: CHEST  2 VIEW  COMPARISON:  04/24/2013, 04/01/2013, 01/08/2012.  FINDINGS: Right arm PICC tip in the mid SVC, confirmed on the lateral image. Cardiomediastinal silhouette unremarkable and unchanged. Lungs clear. Bronchovascular markings normal. Pulmonary vascularity normal. No visible pleural effusions. No pneumothorax.  Sickle osteopathy involving the spine.  IMPRESSION: 1. No acute cardiopulmonary disease. 2. Right arm PICC tip in the mid SVC.   Electronically Signed   By: Hulan Saas M.D.   On: 04/28/2013 09:09   Dg Chest 2 View  04/24/2013   CLINICAL DATA:  Right chest pain; sickle cell crisis.  EXAM: CHEST  2 VIEW  COMPARISON:  Chest radiograph performed at 11/2011  FINDINGS: The lungs are well-aerated and clear. There is no evidence of focal  opacification, pleural effusion or pneumothorax.  The heart is normal in size; the mediastinal contour is within normal limits. No acute osseous abnormalities are seen. Clips are noted within the right upper quadrant, reflecting prior cholecystectomy. H-shaped vertebral bodies reflect the patient's sickle cell disease.  IMPRESSION: No acute cardiopulmonary process seen.   Electronically Signed   By: Roanna Raider M.D.   On: 04/24/2013 03:24   US Abdomen Complete  04/24/2013   CLINICAL DATA:  Right upper quadrant abdominal pain.  EXAM: ULTRASOUND ABDOMEN COMPLETE  COMPARISON:  CT of the abdomen and pelvis performed 06/22/2012, and abdominal ultrasound performed 03/14/2012  FINDINGS: Gallbladder:  Status post cholecystectomy.  No retained stones seen.  Common bile duct:  Diameter: 0.3 cm, within normal limits in caliber.  Liver:  No focal lesion identified. Within normal limits in parenchymal echogenicity.  IVC:  No abnormality visualized.  Pancreas:  Obscured by overlying bowel gas.  Spleen:  Size within normal limits; somewhat echogenic in appearance, with a nodular contour. This is unchanged from the prior CT.  Right Kidney:  Length: 10.6 cm. Echogenicity within normal limits. No mass or hydronephrosis visualized.  Left Kidney:  Length: 10.2 cm. Echogenicity within normal limits. No mass or hydronephrosis visualized.  Abdominal aorta:  No aneurysm visualized.  Other findings:  None.  IMPRESSION: 1. No acute abnormality seen within the abdomen. 2. 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   Electronically Signed   By: Roanna Raider M.D.   On: 04/24/2013 04:51   Ir US Guide Vasc Access Right  04/26/2013   CLINICAL DATA:  Sickle cell anemia crisis and need for IV access.  EXAM: POWER PICC LINE PLACEMENT WITH ULTRASOUND AND FLUOROSCOPIC GUIDANCE  FLUOROSCOPY TIME:  6 seconds.  PROCEDURE: The patient was advised of the possible risks and complications and agreed to undergo the procedure.  The patient was then brought to the angiographic suite for the procedure.  The right arm was prepped with chlorhexidine, draped in the usual sterile fashion using maximum barrier technique (cap and mask, sterile gown, sterile gloves, large sterile sheet, hand hygiene and cutaneous antisepsis) and infiltrated locally with 1% Lidocaine.  Ultrasound demonstrated patency of the right brachial vein, and this was documented with an image. Under real-time ultrasound guidance, this vein was accessed with a 21 gauge micropuncture needle and image documentation was performed. A 0.018 wire was introduced in to the vein. Over this, a 37 cm, 5.0 [place] dual lumen power injectable PICC was advanced to the lower SVC/right atrial junction. Fluoroscopy during the procedure and fluoro spot radiograph confirms appropriate catheter position. The catheter was flushed and covered with a sterile dressing.  Complications: None  IMPRESSION: Successful right arm power injectable PICC line placement with ultrasound and fluoroscopic guidance. The catheter is ready for use.   Electronically Signed   By: Irish Lack M.D.   On: 04/26/2013 17:56   Ir Fluoro Guide Cv Midline Picc Left  04/26/2013   CLINICAL DATA:  Sickle cell  anemia crisis and need for IV access.  EXAM: POWER PICC LINE PLACEMENT WITH ULTRASOUND AND FLUOROSCOPIC GUIDANCE  FLUOROSCOPY TIME:  6 seconds.  PROCEDURE: The patient was advised of the possible risks and complications and agreed to undergo the procedure. The patient was then brought to the angiographic suite for the procedure.  The right arm was prepped with chlorhexidine, draped in the usual sterile fashion using maximum barrier technique (cap and mask, sterile gown, sterile gloves, large sterile sheet, hand hygiene and cutaneous antisepsis) and infiltrated locally with 1% Lidocaine.  Ultrasound demonstrated patency of the right brachial vein, and this was documented with an image. Under real-time ultrasound  guidance, this vein was accessed with a 21 gauge micropuncture needle and image documentation was performed. A 0.018 wire was introduced in to the vein. Over this, a 37 cm, 5.0 [place] dual lumen power injectable PICC was advanced to the lower SVC/right atrial junction. Fluoroscopy during the procedure and fluoro spot radiograph confirms appropriate catheter position. The catheter was flushed and covered with a sterile dressing.  Complications: None  IMPRESSION: Successful right arm power injectable PICC line placement with ultrasound and fluoroscopic guidance. The catheter is ready for use.   Electronically Signed   By: Irish Lack M.D.   On: 04/26/2013 17:56    [facility] Course:  For complete details please refer to admission H and P, but in brief, Pt was seen in the ED, treated and discharged. She presented to Day hospital for additional treatment as her pain was uncontrolled and she was unable to ambulate. She was treated with weight based re-dosing and PCA however although pain is decreased she is unable to ambulate independently. Pt currently rates pain as 6/10. Pain is localized to back and legs. She is being admitted to the hospital for further treatment of her pain.  Hospital Course:  1. Acute SS Disease Pain Crisis - Pt reports that her pain is MUCH improved, RN reported 13 demands, 12 deliveries of PCA. Pt says that she feels that she can go home today. She has been ambulating well and using her incentive spirometry device at bedside. She says that she will follow up closely in the sickle cell clinic. She says that she feels better.  2. Hypokalemia - repleted.  3. Leukocytosis - improving. likely reactive secondary to #1 UA, CXR, XR sinus negative. Pt has had some mild sinus drainage and nosebleed.  4. Anemia - hemoglobin holding stable at approximately 8.  5. Depression - stable, continue sertraline 50 mg po daily.  6. GERD - currently stable.  7. Constipation - Pt  having BMs. Continue Senokot and polyethylene glycol 8. RUE IV infiltration - improved.  9. Former Smoker - counseled patient to not start using tobacco again  Code Status: Full  Family Communication: none at bedside  Disposition Plan: Discharge home today  Physical Exam at Discharge:  BP 92/51  Pulse 86  Temp(Src) 98.3 F (36.8 C) (Oral)  Resp 14  Ht 5\' 4"  (1.626 m)  Wt 183 lb 13.8 oz (83.4 kg)  BMI 31.54 kg/m2  SpO2 94%  LMP 04/06/2013  Weight:  183 lb 13.8 oz (83.4 kg)    Exam:  General: Awake, alert, no apparent distress, cooperative  Cardiovascular: normal s1, s2 sounds  Respiratory: BBS clear to auscultation  Abdomen: soft, nondistended, nontender, no masses  Musculoskeletal: back pain with palpation, less edema RUE (from IV infiltration)   Disposition at Discharge: 01-Home or Self Care  Discharge Orders:  Discharge Orders   Future Appointments Provider Department Dept Phone   05/01/2013 2:00 PM Altha Harm, MD [place] [phone]   Future Orders Complete By Expires   Discharge instructions  As directed    Comments:     Follow up at sickle cell center this week  Return to ER or go to day hospital if symptoms recur, worsen or new problems develop Please avoid all tobacco products   Increase activity slowly  As directed    PICC line removal  As directed      Condition at Discharge:   Stable  Time spent on Discharge:  Greater than 33 minutes spent preparing discharge, reconciling meds, dictating summary, counseling patient  Signed: Clanford Johnson 04/29/2013, 7:56 AM

## 2013-04-29 NOTE — Progress Notes (Signed)
SICKLE CELL SERVICE PROGRESS NOTE  [name] ZOX:[id number] DOB: 07-17-1990 DOA: 04/24/2013 PCP: MATTHEWS,MICHELLE A., MD  Assessment/Plan: 1. Acute SS Disease Pain Crisis - Pt reports that her pain is MUCH improved, RN reported 13 demands, 12 deliveries of PCA.  Pt says that she feels that she can go home today.  She has been ambulating well and using her incentive spirometry device at bedside.  She says that she will follow up closely in the sickle cell clinic.  She says that she feels better.   2. Hypokalemia - repleted.  3. Leukocytosis - improving. likely reactive secondary to #1 UA, CXR, XR sinus negative. Pt has had some mild sinus drainage and nosebleed.  4. Anemia - hemoglobin holding stable at approximately 8.  5. Depression - stable, continue sertraline 50 mg po daily.  6. GERD - currently stable.  7. Constipation - Pt having BMs. Continue Senokot and polyethylene glycol 8. RUE IV infiltration - improved.  9. Former Smoker - counseled patient to not start using tobacco again  Code Status: Full  Family Communication: none at bedside  Disposition Plan: Discharge home today  HPI/Subjective: Pt says she is feeling much better and reports that she is feeling ready to go home.    Objective: Filed Vitals:   04/29/13 0640  BP:   Pulse: 86  Temp:   Resp: 14    Intake/Output Summary (Last 24 hours) at 04/29/13 0743 Last data filed at 04/29/13 0030  Gross per 24 hour  Intake 3023.94 ml  Output   1600 ml  Net 1423.94 ml   Filed Weights   04/24/13 1750  Weight: 183 lb 13.8 oz (83.4 kg)    Exam:  General: Awake, alert, no apparent distress, cooperative  Cardiovascular: normal s1, s2 sounds  Respiratory: BBS clear to auscultation  Abdomen: soft, nondistended, nontender, no masses  Musculoskeletal: back pain with palpation, less edema RUE (from IV infiltration)   Data Reviewed: Basic Metabolic Panel:  Recent Labs Lab 04/24/13 0409 04/25/13 0355  04/27/13 0615 04/29/13 0635  NA 138 137 135 137  K 3.3* 4.4 3.8 4.0  CL 105 105 102 100  CO2 24 22 26 26   GLUCOSE 92 107* 109* 105*  BUN 10 10 8 10   CREATININE 0.73 0.75 0.63 0.68  CALCIUM 8.6 8.9 8.7 9.1   Liver Function Tests:  Recent Labs Lab 04/24/13 0409 04/25/13 0355 04/27/13 0615 04/29/13 0635  AST 16 29 19 20   ALT 12 16 14 14   ALKPHOS 61 57 58 61  BILITOT 1.2 2.6* 2.6* 2.9*  PROT 6.9 7.1 6.5 7.4  ALBUMIN 3.7 3.9 3.5 3.9    Recent Labs Lab 04/24/13 0409  LIPASE 59   No results found for this basename: AMMONIA,  in the last 168 hours CBC:  Recent Labs Lab 04/24/13 0312 04/25/13 0355 04/27/13 0615 04/28/13 0450 04/29/13 0635  WBC 17.8* 20.5* 22.3* 26.0* 22.8*  NEUTROABS  --  13.1*  --   --   --   HGB 9.0* 8.4* 7.8* 8.0* 7.6*  HCT 25.8* 24.3* 22.1* 22.9* 22.1*  MCV 88.4 88.0 89.1 90.5 90.2  PLT 426* 448* 412* 438* 430*   Cardiac Enzymes: No results found for this basename: CKTOTAL, CKMB, CKMBINDEX, TROPONINI,  in the last 168 hours BNP (last 3 results) No results found for this basename: PROBNP,  in the last 8760 hours CBG: No results found for this basename: GLUCAP,  in the last 168 hours  No results found for this or  any previous visit (from the past 240 hour(s)).   Studies: Dg Sinuses Complete  04/28/2013   CLINICAL DATA:  Sickle cell disease. Cough, chest congestion, shortness of breath, leukocytosis. Sinonasal drainage.  EXAM: PARANASAL SINUSES - COMPLETE 3 + VIEW  COMPARISON:  None.  FINDINGS: Paranasal sinuses well aerated. No visible mucous retention cysts or polyps. No visible mucosal thickening. No air-fluid levels. Bony nasal septum midline. Visualized facial bones unremarkable.  IMPRESSION: Normal examination.   Electronically Signed   By: Hulan Saas M.D.   On: 04/28/2013 09:11   Dg Chest 2 View  04/28/2013   CLINICAL DATA:  Sickle cell disease. Cough, chest congestion, shortness of breath, and leukocytosis.  EXAM: CHEST  2 VIEW   COMPARISON:  04/24/2013, 04/01/2013, 01/08/2012.  FINDINGS: Right arm PICC tip in the mid SVC, confirmed on the lateral image. Cardiomediastinal silhouette unremarkable and unchanged. Lungs clear. Bronchovascular markings normal. Pulmonary vascularity normal. No visible pleural effusions. No pneumothorax. Sickle osteopathy involving the spine.  IMPRESSION: 1. No acute cardiopulmonary disease. 2. Right arm PICC tip in the mid SVC.   Electronically Signed   By: Hulan Saas M.D.   On: 04/28/2013 09:09    Scheduled Meds: . enoxaparin (LOVENOX) injection  40 mg Subcutaneous Q24H  . folic acid  1 mg Oral Daily  . HYDROmorphone PCA 0.3 mg/mL   Intravenous Q4H  . hydroxyurea  1,000 mg Oral QHS  . ketorolac  30 mg Intravenous Q6H  . pantoprazole  40 mg Oral Q0600  . potassium chloride  40 mEq Oral Daily  . senna-docusate  1 tablet Oral BID  . sertraline  50 mg Oral Daily   Continuous Infusions: . dextrose 5 % and 0.45% NaCl 20 mL/hr at 04/28/13 1901   Active Problems:   Hb-SS disease with crisis   Leukocytosis, unspecified   Unspecified constipation  [facility] [employer] Pager [phone]. If 7PM-7AM, please contact night-coverage at [email or web address], password [facility] 04/29/2013, 7:43 AM  LOS: 5 days

## 2013-04-29 NOTE — Progress Notes (Signed)
Patient was stable at time of discharge. IV team had removed PICC. Reviewed discharge education with patient. She verbalized understanding. Knew follow up appointment info.

## 2013-05-01 ENCOUNTER — Ambulatory Visit (INDEPENDENT_AMBULATORY_CARE_PROVIDER_SITE_OTHER): Payer: Medicare Other | Admitting: Internal Medicine

## 2013-05-01 ENCOUNTER — Encounter: Payer: Self-pay | Admitting: Internal Medicine

## 2013-05-01 VITALS — BP 116/70 | HR 98 | Temp 98.6°F | Resp 18 | Ht 62.75 in | Wt 183.0 lb

## 2013-05-01 DIAGNOSIS — Z0000 Encounter for general adult medical examination without abnormal findings: Secondary | ICD-10-CM | POA: Diagnosis not present

## 2013-05-01 MED ORDER — METHADONE HCL 5 MG PO TABS: 2.5000 mg | ORAL_TABLET | Freq: Every day | ORAL | Status: DC

## 2013-05-01 NOTE — Progress Notes (Signed)
   Subjective:    Patient ID: [name], female    DOB: [birth year], 22 y.o.   MRN: [id number]  HPI: Pt here fro pap and pelvic examination today in anticipation of starting on Depo-provera. Pt has no vaginal discharge and is not currently sexually active.    Review of Systems  Constitutional: Negative.   HENT: Negative.   Eyes: Negative.   Respiratory: Negative.   Cardiovascular: Negative.   Gastrointestinal: Negative.   Endocrine: Negative.   Genitourinary: Negative.   Musculoskeletal: Positive for myalgias.  Skin: Negative.   Allergic/Immunologic: Negative.   Neurological: Negative.   Hematological: Negative.   Psychiatric/Behavioral: Negative.        Objective:   Physical Exam  Constitutional: She is oriented to person, place, and time. She appears well-developed and well-nourished.  HENT:  Head: Normocephalic and atraumatic.  Eyes: Conjunctivae and EOM are normal. Pupils are equal, round, and reactive to light. No scleral icterus.  Neck: Normal range of motion. Neck supple. No JVD present. No thyromegaly present.  Cardiovascular: Normal rate and regular rhythm.  Exam reveals no gallop and no friction rub.   No murmur heard. Pulmonary/Chest: Effort normal and breath sounds normal. She has no wheezes. She has no rales.  Abdominal: Soft. Bowel sounds are normal. She exhibits no distension and no mass. There is no tenderness.  Genitourinary: Vagina normal and uterus normal. No vaginal discharge found.  No inguinal lymphadenopathy  Musculoskeletal: Normal range of motion.  Neurological: She is alert and oriented to person, place, and time. No cranial nerve deficit.  Skin: Skin is warm and dry.  Psychiatric: She has a normal mood and affect. Her behavior is normal. Judgment and thought content normal.          Assessment & Plan:  1. Annual pap and pelvic exmination: Pt has not been sexually active for 3 moths. This is her 1st vaginal examination. Specimen will  be sent for cytology and Chlamydia screening. She has no vaginal discharge and vaginal vault appears healthy.   2. Immunization: Discussed Guardisil for HPV prophylaxis.   3. Hb SS: Pt having increased pain in the morning during the winter. Will add Methadone 2.5 mg qHS.  Continue Oxycodone PRN.  Continue Hydrea and Folic acid . Vision examination completed for this year.  -Pt has crises triggered by menses. Will consider Depo-provera after PAP results received.

## 2013-05-03 ENCOUNTER — Other Ambulatory Visit (HOSPITAL_COMMUNITY)
Admission: RE | Admit: 2013-05-03 | Discharge: 2013-05-03 | Disposition: A | Discharge status: Home / Self Care | Payer: Medicare Other | Source: Ambulatory Visit | Attending: Internal Medicine | Admitting: Internal Medicine

## 2013-05-03 DIAGNOSIS — Z113 Encounter for screening for infections with a predominantly sexual mode of transmission: Secondary | ICD-10-CM | POA: Insufficient documentation

## 2013-05-07 IMAGING — CR DG SHOULDER 2+V*R*
3 series · 3 of 3 positions shown · non-contrast
Comparison: 05/06/2011

CLINICAL DATA: Right shoulder pain and sickle cell anemia.

RIGHT SHOULDER - 2+ VIEW

[w shoulder ap internal righ]
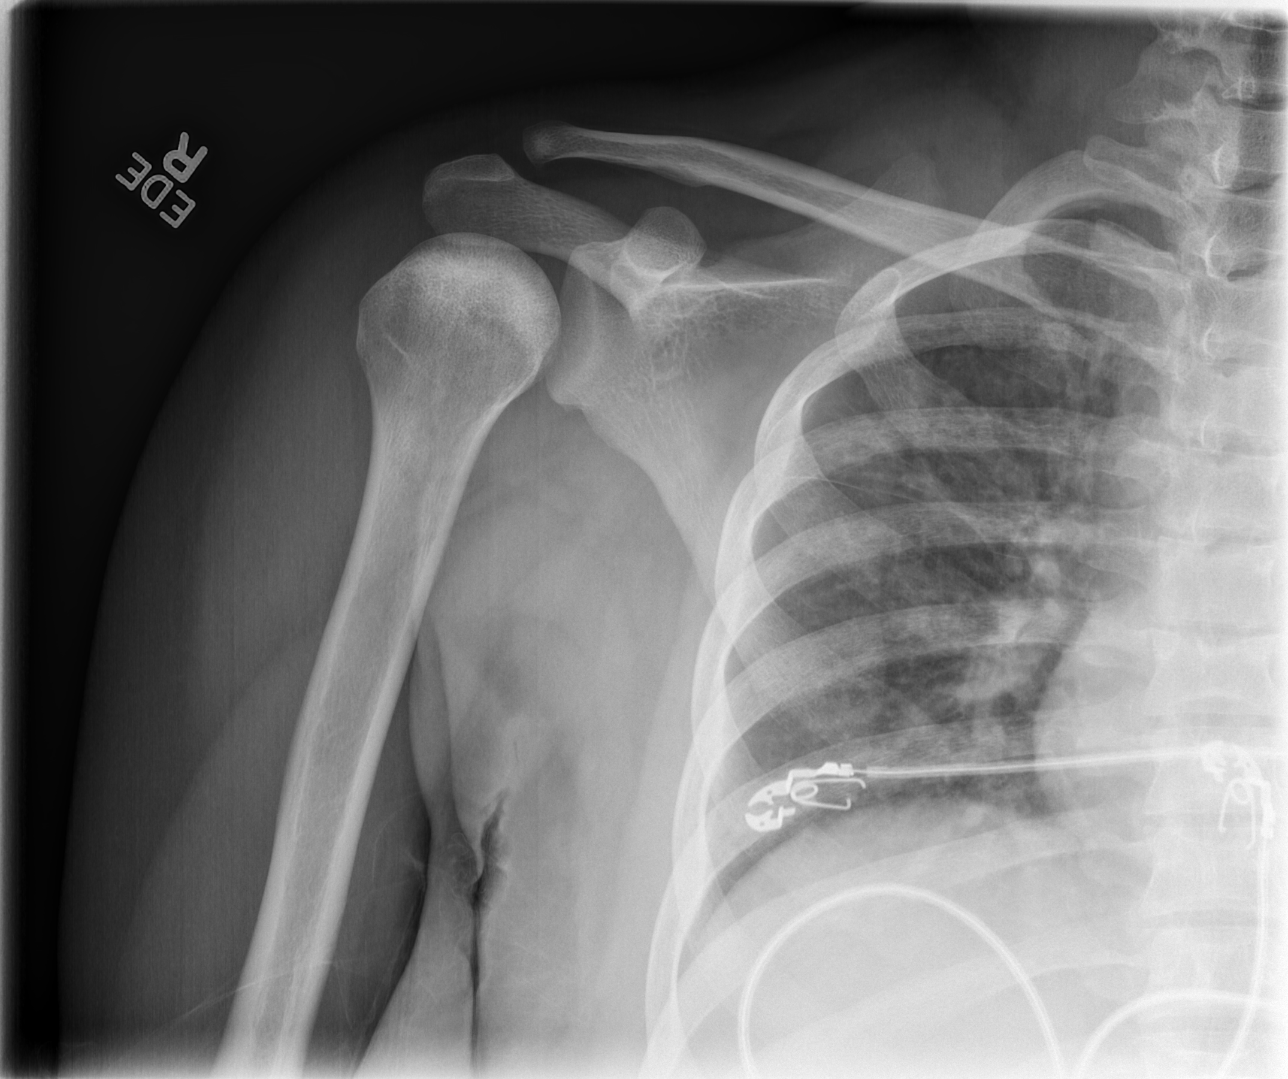

[w shoulder ap external righ]
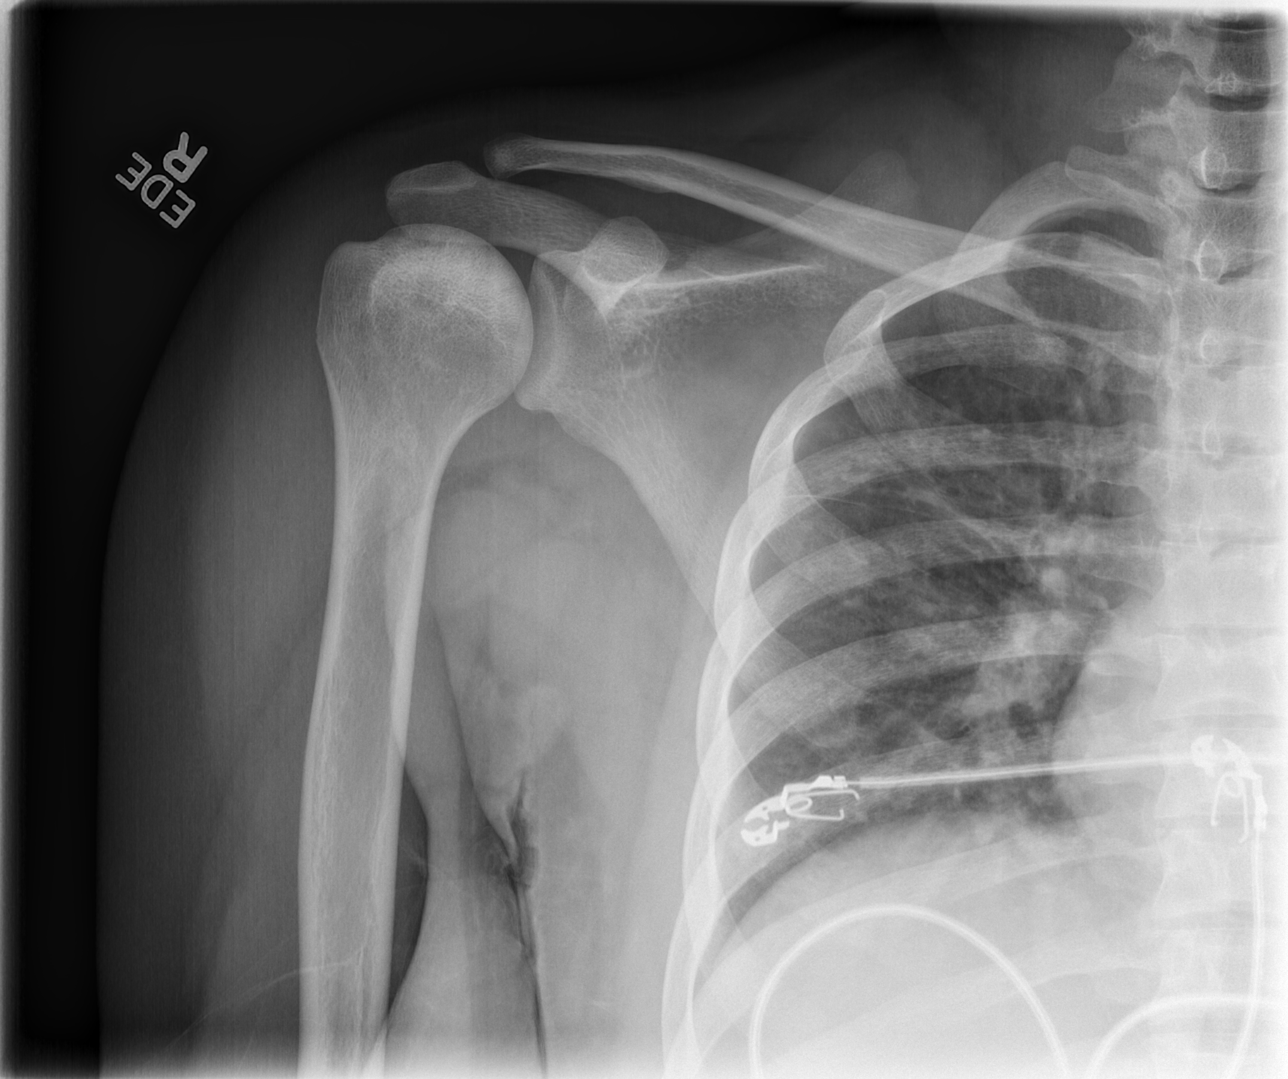

[w shoulder y view right]
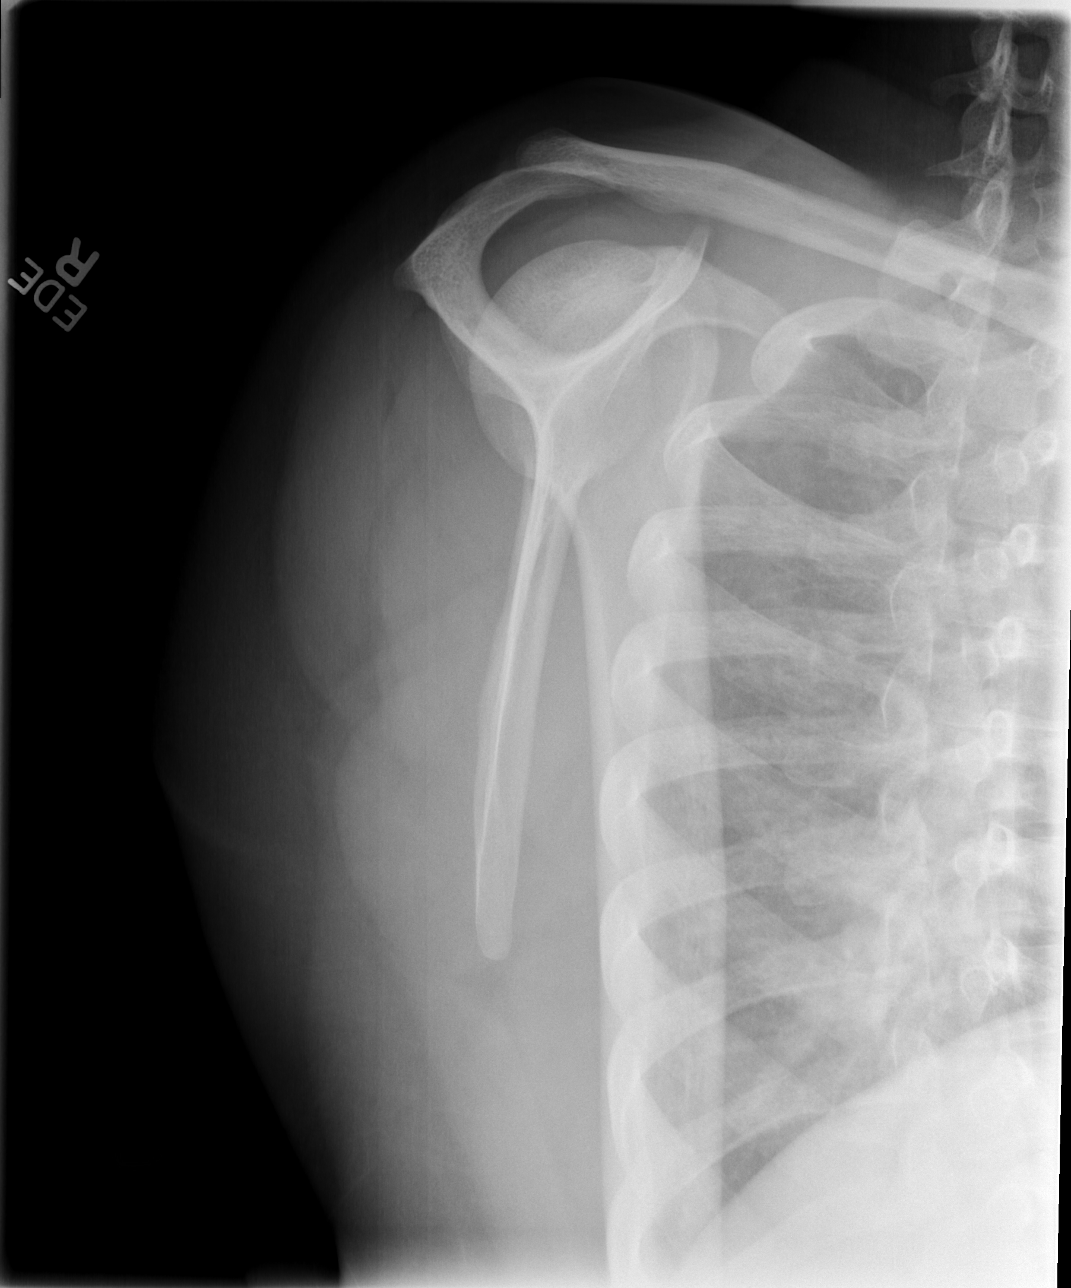

[3 of 3 positions shown; findings below may reference images not displayed]

FINDINGS: There is some sclerosis in the humeral head which appears
slightly more prominent compared to the prior study.  Findings may
be consistent with osteonecrosis.  No associated humeral head
collapse or contour deformity.  No fracture or dislocation is
identified.  Soft tissues are unremarkable.
IMPRESSION: Some increase in sclerosis of the humeral head may be consistent
with osteonecrosis from sickle cell anemia.  No humeral head
collapse or deformity is identified.

## 2013-05-07 IMAGING — CR DG CHEST 1V PORT
1 series · 1 of 1 positions shown · non-contrast
Comparison: 03/08/2012

CLINICAL DATA: Sickle cell crisis.Chest pain.

PORTABLE CHEST - 1 VIEW

[AP]
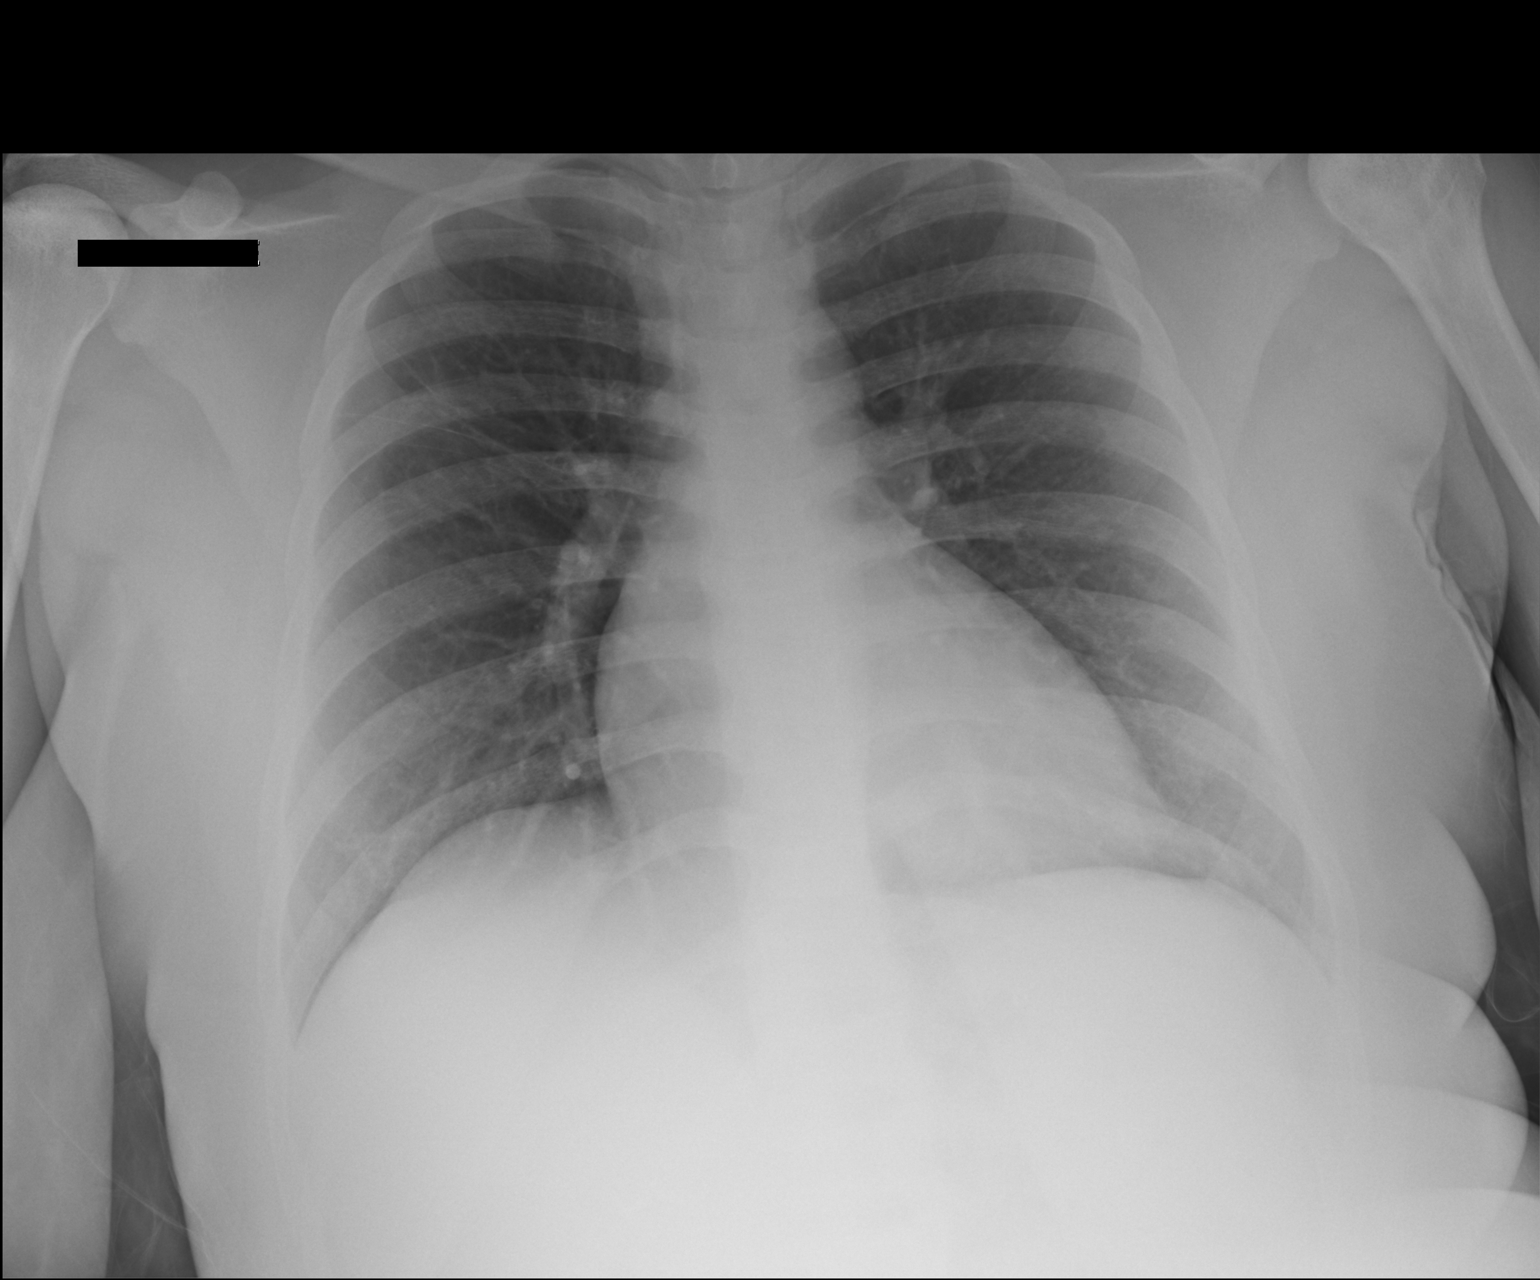

[1 of 1 positions shown; findings below may reference images not displayed]

FINDINGS: Bilateral humeral head infarcts suspected. Midline
trachea.  Borderline cardiomegaly.  Removal of left PICC line. No
pleural effusion or pneumothorax.  Minimal right upper lobe
scarring.  Left lung clear.
IMPRESSION: No acute cardiopulmonary disease.

## 2013-05-13 ENCOUNTER — Other Ambulatory Visit: Payer: Self-pay | Admitting: Internal Medicine

## 2013-05-13 ENCOUNTER — Telehealth: Payer: Self-pay | Admitting: Hematology

## 2013-05-13 DIAGNOSIS — D571 Sickle-cell disease without crisis: Secondary | ICD-10-CM

## 2013-05-13 MED ORDER — OXYCODONE HCL 10 MG PO TABS: 15.0000 mg | ORAL_TABLET | Freq: Three times a day (TID) | ORAL | Status: DC | PRN

## 2013-05-13 NOTE — Telephone Encounter (Signed)
Oxycodone 10mg , not due until Saturday but would like to pick prescription up on Wednesday 05-15-2013

## 2013-05-13 NOTE — Progress Notes (Signed)
Prescription issued for oxycodone 10 mg #90 tabs. Next prescription not due until 1/15/205.

## 2013-05-13 NOTE — Telephone Encounter (Signed)
Left message at [phone] and advised that prescription for oxycodone will be ready 05-14-2013 between 9-4:30

## 2013-05-15 ENCOUNTER — Encounter: Payer: Self-pay | Admitting: *Deleted

## 2013-05-15 ENCOUNTER — Ambulatory Visit: Payer: Medicare Other | Admitting: *Deleted

## 2013-05-27 ENCOUNTER — Inpatient Hospital Stay (HOSPITAL_COMMUNITY)
Admission: EM | Admit: 2013-05-27 | Discharge: 2013-06-01 | DRG: 812 | Disposition: A | Discharge status: Home / Self Care | Payer: Medicare Other | Attending: Internal Medicine | Admitting: Internal Medicine

## 2013-05-27 ENCOUNTER — Telehealth: Payer: Self-pay | Admitting: Internal Medicine

## 2013-05-27 DIAGNOSIS — D599 Acquired hemolytic anemia, unspecified: Secondary | ICD-10-CM | POA: Diagnosis present

## 2013-05-27 DIAGNOSIS — Z8249 Family history of ischemic heart disease and other diseases of the circulatory system: Secondary | ICD-10-CM

## 2013-05-27 DIAGNOSIS — G894 Chronic pain syndrome: Secondary | ICD-10-CM | POA: Diagnosis present

## 2013-05-27 DIAGNOSIS — D571 Sickle-cell disease without crisis: Secondary | ICD-10-CM

## 2013-05-27 DIAGNOSIS — Z87891 Personal history of nicotine dependence: Secondary | ICD-10-CM

## 2013-05-27 DIAGNOSIS — D72829 Elevated white blood cell count, unspecified: Secondary | ICD-10-CM | POA: Diagnosis present

## 2013-05-27 DIAGNOSIS — D5700 Hb-SS disease with crisis, unspecified: Secondary | ICD-10-CM | POA: Diagnosis not present

## 2013-05-27 DIAGNOSIS — F32A Depression, unspecified: Secondary | ICD-10-CM

## 2013-05-27 DIAGNOSIS — M79606 Pain in leg, unspecified: Secondary | ICD-10-CM | POA: Diagnosis present

## 2013-05-27 DIAGNOSIS — K5900 Constipation, unspecified: Secondary | ICD-10-CM | POA: Diagnosis present

## 2013-05-27 DIAGNOSIS — M549 Dorsalgia, unspecified: Secondary | ICD-10-CM

## 2013-05-27 DIAGNOSIS — K219 Gastro-esophageal reflux disease without esophagitis: Secondary | ICD-10-CM | POA: Diagnosis present

## 2013-05-27 DIAGNOSIS — F329 Major depressive disorder, single episode, unspecified: Secondary | ICD-10-CM

## 2013-05-27 DIAGNOSIS — Z79899 Other long term (current) drug therapy: Secondary | ICD-10-CM

## 2013-05-27 DIAGNOSIS — Z833 Family history of diabetes mellitus: Secondary | ICD-10-CM

## 2013-05-27 MED ORDER — SODIUM CHLORIDE 0.9 % IV SOLN
INTRAVENOUS | Status: DC
  Administered 2013-05-28 (×2): via INTRAVENOUS

## 2013-05-27 MED ORDER — HYDROMORPHONE HCL PF 2 MG/ML IJ SOLN
2.0000 mg | Freq: Once | INTRAMUSCULAR | Status: AC
  Administered 2013-05-27: 2 mg via INTRAMUSCULAR
  Filled 2013-05-27: qty 1

## 2013-05-27 MED ORDER — OXYCODONE HCL 5 MG PO TABS
10.0000 mg | ORAL_TABLET | Freq: Once | ORAL | Status: AC
  Administered 2013-05-27: 10 mg via ORAL
  Filled 2013-05-27: qty 2

## 2013-05-27 MED ORDER — METHADONE HCL 5 MG PO TABS: 2.5000 mg | ORAL_TABLET | Freq: Every day | ORAL | Status: DC

## 2013-05-27 NOTE — Telephone Encounter (Signed)
Routing to Dr. Zigmund Daniel for refills on methadone and oxycodone

## 2013-05-27 NOTE — Telephone Encounter (Signed)
Prescription given for Methadone 5 mg. Pt to take Methadone 2.5 mg q HS.

## 2013-05-27 NOTE — ED Notes (Signed)
Pt states that she started having bilateral knee pain aroudn 5 oclock and took a percocet 10mg . Woke up afterwards and threw up. Hx of SCC.

## 2013-05-27 NOTE — ED Notes (Signed)
Bed: WA01 Expected date:  Expected time:  Means of arrival:  Comments: EMS/sickle cell crisis/knee pain

## 2013-05-28 ENCOUNTER — Encounter (HOSPITAL_COMMUNITY): Payer: Self-pay | Admitting: Emergency Medicine

## 2013-05-28 ENCOUNTER — Encounter (HOSPITAL_COMMUNITY): Payer: Self-pay | Admitting: *Deleted

## 2013-05-28 ENCOUNTER — Inpatient Hospital Stay (HOSPITAL_COMMUNITY): Payer: Medicare Other

## 2013-05-28 ENCOUNTER — Telehealth (HOSPITAL_COMMUNITY): Payer: Self-pay | Admitting: *Deleted

## 2013-05-28 ENCOUNTER — Observation Stay (HOSPITAL_COMMUNITY): Payer: Medicare Other

## 2013-05-28 DIAGNOSIS — M549 Dorsalgia, unspecified: Secondary | ICD-10-CM | POA: Diagnosis not present

## 2013-05-28 DIAGNOSIS — M79606 Pain in leg, unspecified: Secondary | ICD-10-CM | POA: Diagnosis present

## 2013-05-28 DIAGNOSIS — M25569 Pain in unspecified knee: Secondary | ICD-10-CM | POA: Diagnosis not present

## 2013-05-28 DIAGNOSIS — F329 Major depressive disorder, single episode, unspecified: Secondary | ICD-10-CM | POA: Diagnosis not present

## 2013-05-28 DIAGNOSIS — M7989 Other specified soft tissue disorders: Secondary | ICD-10-CM | POA: Diagnosis not present

## 2013-05-28 DIAGNOSIS — F3289 Other specified depressive episodes: Secondary | ICD-10-CM

## 2013-05-28 DIAGNOSIS — D5700 Hb-SS disease with crisis, unspecified: Secondary | ICD-10-CM | POA: Diagnosis not present

## 2013-05-28 DIAGNOSIS — K219 Gastro-esophageal reflux disease without esophagitis: Secondary | ICD-10-CM

## 2013-05-28 LAB — CBC WITH DIFFERENTIAL/PLATELET
BASOS ABS: 0.1 10*3/uL (ref 0.0–0.1)
BASOS PCT: 0 % (ref 0–1)
Basophils Absolute: 0.1 10*3/uL (ref 0.0–0.1)
Basophils Relative: 1 % (ref 0–1)
EOS ABS: 0.4 10*3/uL (ref 0.0–0.7)
EOS ABS: 0.5 10*3/uL (ref 0.0–0.7)
EOS PCT: 3 % (ref 0–5)
Eosinophils Relative: 3 % (ref 0–5)
HCT: 23.2 % — ABNORMAL LOW (ref 36.0–46.0)
HCT: 27.1 % — ABNORMAL LOW (ref 36.0–46.0)
Hemoglobin: 8.3 g/dL — ABNORMAL LOW (ref 12.0–15.0)
Hemoglobin: 9.6 g/dL — ABNORMAL LOW (ref 12.0–15.0)
Lymphocytes Relative: 29 % (ref 12–46)
Lymphocytes Relative: 32 % (ref 12–46)
Lymphs Abs: 4.1 10*3/uL — ABNORMAL HIGH (ref 0.7–4.0)
Lymphs Abs: 4.8 10*3/uL — ABNORMAL HIGH (ref 0.7–4.0)
MCH: 31.6 pg (ref 26.0–34.0)
MCH: 31.9 pg (ref 26.0–34.0)
MCHC: 35.4 g/dL (ref 30.0–36.0)
MCHC: 35.8 g/dL (ref 30.0–36.0)
MCV: 88.2 fL (ref 78.0–100.0)
MCV: 90 fL (ref 78.0–100.0)
Monocytes Absolute: 1.5 10*3/uL — ABNORMAL HIGH (ref 0.1–1.0)
Monocytes Absolute: 1.6 10*3/uL — ABNORMAL HIGH (ref 0.1–1.0)
Monocytes Relative: 10 % (ref 3–12)
Monocytes Relative: 11 % (ref 3–12)
NEUTROS ABS: 8.2 10*3/uL — AB (ref 1.7–7.7)
NEUTROS PCT: 55 % (ref 43–77)
NEUTROS PCT: 56 % (ref 43–77)
Neutro Abs: 8 10*3/uL — ABNORMAL HIGH (ref 1.7–7.7)
PLATELETS: 386 10*3/uL (ref 150–400)
PLATELETS: 466 10*3/uL — AB (ref 150–400)
RBC: 2.63 MIL/uL — ABNORMAL LOW (ref 3.87–5.11)
RBC: 3.01 MIL/uL — ABNORMAL LOW (ref 3.87–5.11)
RDW: 16.2 % — AB (ref 11.5–15.5)
RDW: 16.7 % — ABNORMAL HIGH (ref 11.5–15.5)
WBC: 14.2 10*3/uL — ABNORMAL HIGH (ref 4.0–10.5)
WBC: 15 10*3/uL — ABNORMAL HIGH (ref 4.0–10.5)

## 2013-05-28 LAB — URINALYSIS, ROUTINE W REFLEX MICROSCOPIC
Bilirubin Urine: NEGATIVE
Glucose, UA: NEGATIVE mg/dL
HGB URINE DIPSTICK: NEGATIVE
KETONES UR: NEGATIVE mg/dL
Leukocytes, UA: NEGATIVE
Nitrite: NEGATIVE
PROTEIN: NEGATIVE mg/dL
Specific Gravity, Urine: 1.014 (ref 1.005–1.030)
Urobilinogen, UA: 1 mg/dL (ref 0.0–1.0)
pH: 6.5 (ref 5.0–8.0)

## 2013-05-28 LAB — RETICULOCYTES
RBC.: 2.63 MIL/uL — AB (ref 3.87–5.11)
RBC.: 3.01 MIL/uL — ABNORMAL LOW (ref 3.87–5.11)
RETIC COUNT ABSOLUTE: 455 10*3/uL — AB (ref 19.0–186.0)
Retic Count, Absolute: 511.7 10*3/uL — ABNORMAL HIGH (ref 19.0–186.0)
Retic Ct Pct: 17 % — ABNORMAL HIGH (ref 0.4–3.1)
Retic Ct Pct: 17.3 % — ABNORMAL HIGH (ref 0.4–3.1)

## 2013-05-28 LAB — COMPREHENSIVE METABOLIC PANEL
ALK PHOS: 57 U/L (ref 39–117)
ALK PHOS: 66 U/L (ref 39–117)
ALT: 12 U/L (ref 0–35)
ALT: 15 U/L (ref 0–35)
AST: 24 U/L (ref 0–37)
AST: 25 U/L (ref 0–37)
Albumin: 3.6 g/dL (ref 3.5–5.2)
Albumin: 4.1 g/dL (ref 3.5–5.2)
BUN: 10 mg/dL (ref 6–23)
BUN: 10 mg/dL (ref 6–23)
CO2: 23 meq/L (ref 19–32)
CO2: 24 mEq/L (ref 19–32)
Calcium: 8.7 mg/dL (ref 8.4–10.5)
Calcium: 9.1 mg/dL (ref 8.4–10.5)
Chloride: 100 mEq/L (ref 96–112)
Chloride: 104 mEq/L (ref 96–112)
Creatinine, Ser: 0.85 mg/dL (ref 0.50–1.10)
Creatinine, Ser: 1.01 mg/dL (ref 0.50–1.10)
GFR, EST NON AFRICAN AMERICAN: 79 mL/min — AB (ref >90)
GLUCOSE: 95 mg/dL (ref 70–99)
Glucose, Bld: 96 mg/dL (ref 70–99)
POTASSIUM: 4.3 meq/L (ref 3.7–5.3)
POTASSIUM: 4.3 meq/L (ref 3.7–5.3)
SODIUM: 140 meq/L (ref 137–147)
Sodium: 138 mEq/L (ref 137–147)
TOTAL PROTEIN: 6.6 g/dL (ref 6.0–8.3)
Total Bilirubin: 1.7 mg/dL — ABNORMAL HIGH (ref 0.3–1.2)
Total Bilirubin: 1.9 mg/dL — ABNORMAL HIGH (ref 0.3–1.2)
Total Protein: 7.6 g/dL (ref 6.0–8.3)

## 2013-05-28 LAB — MRSA PCR SCREENING: MRSA by PCR: NEGATIVE

## 2013-05-28 LAB — LIPASE, BLOOD: Lipase: 35 U/L (ref 11–59)

## 2013-05-28 LAB — MAGNESIUM: MAGNESIUM: 2 mg/dL (ref 1.5–2.5)

## 2013-05-28 LAB — PREGNANCY, URINE: Preg Test, Ur: NEGATIVE

## 2013-05-28 MED ORDER — DIPHENHYDRAMINE HCL 25 MG PO CAPS
25.0000 mg | ORAL_CAPSULE | Freq: Four times a day (QID) | ORAL | Status: DC | PRN
  Administered 2013-05-29: 25 mg via ORAL
  Filled 2013-05-28: qty 1

## 2013-05-28 MED ORDER — ONDANSETRON HCL 4 MG/2ML IJ SOLN: 4.0000 mg | Freq: Four times a day (QID) | INTRAMUSCULAR | Status: DC | PRN

## 2013-05-28 MED ORDER — HYDROMORPHONE HCL PF 2 MG/ML IJ SOLN
2.0000 mg | Freq: Once | INTRAMUSCULAR | Status: AC
  Administered 2013-05-28: 2 mg via INTRAVENOUS
  Filled 2013-05-28: qty 1

## 2013-05-28 MED ORDER — LIDOCAINE HCL 1 % IJ SOLN
INTRAMUSCULAR | Status: AC
  Filled 2013-05-28: qty 20

## 2013-05-28 MED ORDER — CYCLOBENZAPRINE HCL 10 MG PO TABS: 5.0000 mg | ORAL_TABLET | Freq: Three times a day (TID) | ORAL | Status: DC | PRN

## 2013-05-28 MED ORDER — SERTRALINE HCL 50 MG PO TABS
50.0000 mg | ORAL_TABLET | Freq: Every day | ORAL | Status: DC
  Administered 2013-05-28 – 2013-06-01 (×5): 50 mg via ORAL
  Filled 2013-05-28 (×6): qty 1

## 2013-05-28 MED ORDER — ONDANSETRON HCL 4 MG/2ML IJ SOLN: 4.0000 mg | Freq: Three times a day (TID) | INTRAMUSCULAR | Status: DC | PRN

## 2013-05-28 MED ORDER — HYDROMORPHONE HCL PF 2 MG/ML IJ SOLN
2.0000 mg | Freq: Once | INTRAMUSCULAR | Status: AC
  Administered 2013-05-28: 2 mg via INTRAVENOUS

## 2013-05-28 MED ORDER — HYDROMORPHONE HCL PF 1 MG/ML IJ SOLN
1.0000 mg | INTRAMUSCULAR | Status: DC | PRN
  Administered 2013-05-28 (×2): 1 mg via INTRAVENOUS
  Filled 2013-05-28 (×2): qty 1

## 2013-05-28 MED ORDER — NALOXONE HCL 0.4 MG/ML IJ SOLN: 0.4000 mg | INTRAMUSCULAR | Status: DC | PRN

## 2013-05-28 MED ORDER — HEPARIN SODIUM (PORCINE) 5000 UNIT/ML IJ SOLN
5000.0000 [IU] | Freq: Three times a day (TID) | INTRAMUSCULAR | Status: DC
  Administered 2013-05-28 – 2013-06-01 (×13): 5000 [IU] via SUBCUTANEOUS
  Filled 2013-05-28 (×18): qty 1

## 2013-05-28 MED ORDER — PRENATAL MULTIVITAMIN CH
1.0000 | ORAL_TABLET | Freq: Every day | ORAL | Status: DC
  Administered 2013-05-29 – 2013-05-31 (×3): 1 via ORAL
  Filled 2013-05-28 (×6): qty 1

## 2013-05-28 MED ORDER — KETOROLAC TROMETHAMINE 30 MG/ML IJ SOLN
30.0000 mg | Freq: Once | INTRAMUSCULAR | Status: AC
  Administered 2013-05-28: 30 mg via INTRAVENOUS
  Filled 2013-05-28: qty 1

## 2013-05-28 MED ORDER — SENNOSIDES-DOCUSATE SODIUM 8.6-50 MG PO TABS
1.0000 | ORAL_TABLET | Freq: Two times a day (BID) | ORAL | Status: DC
  Administered 2013-05-28 – 2013-06-01 (×8): 1 via ORAL
  Filled 2013-05-28 (×12): qty 1

## 2013-05-28 MED ORDER — KETOROLAC TROMETHAMINE 30 MG/ML IJ SOLN
30.0000 mg | Freq: Four times a day (QID) | INTRAMUSCULAR | Status: DC
  Administered 2013-05-28 – 2013-06-01 (×16): 30 mg via INTRAVENOUS
  Filled 2013-05-28 (×24): qty 1

## 2013-05-28 MED ORDER — HYDROMORPHONE HCL PF 1 MG/ML IJ SOLN
1.0000 mg | INTRAMUSCULAR | Status: AC | PRN
  Administered 2013-05-28 (×2): 1 mg via INTRAVENOUS
  Filled 2013-05-28 (×2): qty 1

## 2013-05-28 MED ORDER — HYDROMORPHONE HCL PF 2 MG/ML IJ SOLN
2.0000 mg | Freq: Once | INTRAMUSCULAR | Status: DC | PRN
  Filled 2013-05-28: qty 1

## 2013-05-28 MED ORDER — SODIUM CHLORIDE 0.9 % IV SOLN
INTRAVENOUS | Status: DC
Start: 2013-05-28 — End: 2013-05-28

## 2013-05-28 MED ORDER — HYDROMORPHONE HCL PF 1 MG/ML IJ SOLN: 1.0000 mg | INTRAMUSCULAR | Status: DC | PRN

## 2013-05-28 MED ORDER — KETOROLAC TROMETHAMINE 60 MG/2ML IM SOLN: 60.0000 mg | Freq: Once | INTRAMUSCULAR | Status: DC | PRN

## 2013-05-28 MED ORDER — SODIUM CHLORIDE 0.9 % IJ SOLN: 9.0000 mL | INTRAMUSCULAR | Status: DC | PRN

## 2013-05-28 MED ORDER — METHADONE HCL 5 MG PO TABS
2.5000 mg | ORAL_TABLET | Freq: Every day | ORAL | Status: DC
  Administered 2013-05-28 – 2013-05-29 (×2): 2.5 mg via ORAL
  Filled 2013-05-28 (×2): qty 1

## 2013-05-28 MED ORDER — FOLIC ACID 1 MG PO TABS: 1.0000 mg | ORAL_TABLET | Freq: Every day | ORAL | Status: DC

## 2013-05-28 MED ORDER — HYDROMORPHONE 0.3 MG/ML IV SOLN
INTRAVENOUS | Status: DC
  Administered 2013-05-28: 21:00:00 via INTRAVENOUS
  Administered 2013-05-28: 8.49 mg via INTRAVENOUS
  Administered 2013-05-28 (×2): via INTRAVENOUS
  Administered 2013-05-28: 4.77 mg via INTRAVENOUS
  Administered 2013-05-28: 5.59 mg via INTRAVENOUS
  Administered 2013-05-29: 22:00:00 via INTRAVENOUS
  Administered 2013-05-29: 6.41 mg via INTRAVENOUS
  Administered 2013-05-29: 7.59 mg via INTRAVENOUS
  Administered 2013-05-29: 5.59 mg via INTRAVENOUS
  Administered 2013-05-29 – 2013-05-30 (×5): via INTRAVENOUS
  Administered 2013-05-30: 9.22 mg via INTRAVENOUS
  Administered 2013-05-30 (×2): via INTRAVENOUS
  Administered 2013-05-30: 8.34 mg via INTRAVENOUS
  Administered 2013-05-30: 5.12 mg via INTRAVENOUS
  Administered 2013-05-30: 14:00:00 via INTRAVENOUS
  Administered 2013-05-30: 5.9 mg via INTRAVENOUS
  Administered 2013-05-30: 8.46 mg via INTRAVENOUS
  Administered 2013-05-30 – 2013-05-31 (×2): via INTRAVENOUS
  Administered 2013-05-31: 6.39 mg via INTRAVENOUS
  Administered 2013-05-31: via INTRAVENOUS
  Administered 2013-05-31: 4.79 mg via INTRAVENOUS
  Administered 2013-05-31: 15:00:00 via INTRAVENOUS
  Administered 2013-05-31: 5.15 mg via INTRAVENOUS
  Administered 2013-05-31 – 2013-06-01 (×2): via INTRAVENOUS
  Administered 2013-06-01: 11.09 mg via INTRAVENOUS
  Administered 2013-06-01: 3 mg via INTRAVENOUS
  Administered 2013-06-01: 7.5 mg via INTRAVENOUS
  Administered 2013-06-01: 3.27 mg via INTRAVENOUS
  Administered 2013-06-01 (×2): via INTRAVENOUS
  Filled 2013-05-28 (×20): qty 25

## 2013-05-28 MED ORDER — ONDANSETRON HCL 4 MG PO TABS: 4.0000 mg | ORAL_TABLET | Freq: Four times a day (QID) | ORAL | Status: DC | PRN

## 2013-05-28 MED ORDER — DEXTROSE-NACL 5-0.45 % IV SOLN
INTRAVENOUS | Status: DC
  Administered 2013-05-28 – 2013-05-30 (×4): via INTRAVENOUS
  Administered 2013-05-31: 10 mL/h via INTRAVENOUS

## 2013-05-28 MED ORDER — OXYCODONE HCL 5 MG PO TABS
15.0000 mg | ORAL_TABLET | Freq: Three times a day (TID) | ORAL | Status: DC | PRN
  Administered 2013-05-28: 15 mg via ORAL
  Filled 2013-05-28: qty 3

## 2013-05-28 MED ORDER — FOLIC ACID 1 MG PO TABS
1.5000 mg | ORAL_TABLET | Freq: Every morning | ORAL | Status: DC
  Administered 2013-05-28 – 2013-05-30 (×3): 1.5 mg via ORAL
  Filled 2013-05-28: qty 1.5
  Filled 2013-05-28: qty 2
  Filled 2013-05-28: qty 1.5

## 2013-05-28 MED ORDER — SODIUM CHLORIDE 0.9 % IV SOLN
INTRAVENOUS | Status: AC
  Administered 2013-05-28: 06:00:00 via INTRAVENOUS

## 2013-05-28 MED ORDER — POLYETHYLENE GLYCOL 3350 17 G PO PACK
17.0000 g | PACK | Freq: Every day | ORAL | Status: DC | PRN
  Filled 2013-05-28: qty 1

## 2013-05-28 MED ORDER — HYDROXYUREA 500 MG PO CAPS
1000.0000 mg | ORAL_CAPSULE | Freq: Every day | ORAL | Status: DC
  Administered 2013-05-28 – 2013-05-31 (×4): 1000 mg via ORAL
  Filled 2013-05-28 (×6): qty 2

## 2013-05-28 NOTE — H&P (Signed)
[facility] History and Physical  [name]  CVE:[id number]  DOB: 1990-09-18  DOS: the patient was seen and examined on 05/28/2013 PCP: MATTHEWS,MICHELLE A., MD  Chief Complaint: Bilateral knee pain  HPI: [name] is a 23 y.o. female with Past medical history of sickle cell crisis, GERD, chronic pain syndrome,. The patient is coming from home The patient presents with complaints that she has a sickle cell pain in her knee. She mentions that she has chronic pain all over her body but since this afternoon she started having bilateral knee pain with swelling and so much so that she was not able to bear any weight on her knee and therefore she came to the ED. She denies any fever or chills but the has a low-grade temperature. She denies any cough, chest pain, shortness of breath, diarrhea, burning urination. There is no recent change in her medication other than the addition of methadone. She has tried to take her oxycodone but it has not helped her pain. She slept and when she woke up she had an episode of vomiting.  Review of Systems: as mentioned in the history of present illness.  A Comprehensive review of the other systems is negative.  Past Medical History  Diagnosis Date  . H/O: 1 miscarriage 03/22/2011  . 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 Depression 01/06/2011  . GERD (gastroesophageal reflux disease) 02/17/2011  . Trichotillomania     h/o  . Blood transfusion     "lots"  . Sickle cell anemia with crisis   . Exertional dyspnea     "sometimes"  . Sickle cell anemia   . Headache(784.0)   . Migraines 11/08/11    "@ least twice/month"  . Chronic back pain     "very severe; have knot in my back; from tight muscle; take RX and exercise for it"  . Mood swings 11/08/11    "I go back and forth; real bad"  . Pregnancy   . Blood dyscrasia     SICKLE CELL   Past Surgical History  Procedure Laterality Date  . Cholecystectomy  05/2010  . Dilation and curettage  of uterus  02/20/11    S/P miscarriage   Social History:  reports that she quit smoking about 2 months ago. Her smoking use included Cigarettes. She has a .25 pack-year smoking history. She has never used smokeless tobacco. She reports that she drinks alcohol. She reports that she does not use illicit drugs. Independent for most of her  ADL.  Allergies  Allergen Reactions  . Carrot Oil Hives and Swelling  . Latex Rash    Family History  Problem Relation Age of Onset  . Diabetes Mother   . Alcoholism    . Depression    . Hypercholesterolemia    . Hypertension    . Migraines    . Diabetes Maternal Grandmother   . Diabetes Paternal Grandmother     Prior to Admission medications   Medication Sig Start Date End Date Taking? Authorizing Provider  folic acid (FOLVITE) 025 MCG tablet Take 2 tablets (1,600 mcg total) by mouth every morning. 02/26/13  Yes [name], MD  hydroxyurea (HYDREA) 500 MG capsule Take 2 capsules (1,000 mg total) by mouth at bedtime. May take with food to minimize GI side effects. 01/18/13  Yes Kerin Perna, NP  methadone (DOLOPHINE) 5 MG tablet Take 0.5 tablets (2.5 mg total) by mouth at bedtime. 05/27/13  Yes [name], MD  ondansetron [facility]) 4  MG tablet Take 4 mg by mouth every 6 (six) hours as needed for nausea or vomiting.   Yes Historical Provider, MD  polyethylene glycol (MIRALAX / GLYCOLAX) packet Take 17 g by mouth daily as needed (for constipation).   Yes Historical Provider, MD  Prenatal Vit-Fe Fumarate-FA (PRENATAL MULTIVITAMIN) TABS tablet Take 1 tablet by mouth daily at 12 noon.   Yes Historical Provider, MD  sertraline (ZOLOFT) 50 MG tablet Take 1 tablet (50 mg total) by mouth daily. 02/26/13  Yes [name], MD  Oxycodone HCl 10 MG TABS Take 1.5 tablets (15 mg total) by mouth 3 (three) times daily as needed. 05/13/13   [name], MD    Physical Exam: Filed Vitals:   05/27/13 2251 05/28/13 0359  BP: 125/81  106/57  Pulse: 95 101  Temp: 100.3 F (37.9 C) 98.1 F (36.7 C)  TempSrc: Oral Oral  Resp: 22 18  SpO2: 98% 95%    General: Alert, Awake and Oriented to Time, Place and Person. Appear in mild distress, patient spontaneously change her position from right decubitus to left decubitus without any pain Eyes: PERRL ENT: Oral Mucosa clear moist. Neck: No JVD Cardiovascular: S1 and S2 Present, no Murmur, Peripheral Pulses Present Respiratory: Bilateral Air entry equal and Decreased, Clear to Auscultation,  No Crackles, nowheezes Abdomen: Bowel Sound Present, Soft and Non tender Skin: No Rash Extremities: No Pedal edema, no calf tenderness, bilateral knee swelling with some warmth Neurologic: Grossly Unremarkable.  Labs on Admission:  CBC:  Recent Labs Lab 05/28/13 0005  WBC 14.2*  NEUTROABS 8.0*  HGB 9.6*  HCT 27.1*  MCV 90.0  PLT 466*    CMP     Component Value Date/Time   NA 138 05/28/2013 0005   K 4.3 05/28/2013 0005   CL 100 05/28/2013 0005   CO2 24 05/28/2013 0005   GLUCOSE 95 05/28/2013 0005   BUN 10 05/28/2013 0005   CREATININE 1.01 05/28/2013 0005   CREATININE 0.53 10/17/2012 1039   CALCIUM 9.1 05/28/2013 0005   PROT 7.6 05/28/2013 0005   ALBUMIN 4.1 05/28/2013 0005   AST 25 05/28/2013 0005   ALT 15 05/28/2013 0005   ALKPHOS 66 05/28/2013 0005   BILITOT 1.7* 05/28/2013 0005   GFRNONAA 79* 05/28/2013 0005   GFRAA >90 05/28/2013 0005     Recent Labs Lab 05/28/13 0005  LIPASE 35   No results found for this basename: AMMONIA,  in the last 168 hours  No results found for this basename: CKTOTAL, CKMB, CKMBINDEX, TROPONINI,  in the last 168 hours BNP (last 3 results) No results found for this basename: PROBNP,  in the last 8760 hours  Radiological Exams on Admission: No results found.  Assessment/Plan Principal Problem:   Sickle cell pain crisis Active Problems:   Leg pain   Sickle cell anemia with pain   1. Sickle cell pain crisis The patient is presenting  with an episode of bilateral knee pain that started spontaneously this off and on. At present her examination is unremarkable and the rest of the laboratory workup also does not show any abnormality. I will get an x-ray of her need for further workup. Full considering her history of sickle cell pain and sickle cell crisis I would continue her on IV fluids, IV Zofran, IV Dilaudid as needed. Her pain does not appear significantly severe to place her on PCA pump. I would also continue her on home dose of methadone and OxyIR. A consent PTOT in the  morning  2. Back pain Patient did complain of back pain off and on although she does not have any pain at present. The pain is located on the para spinal region and is reproducible, likely spasm, may benefit from Flexeril and physical therapy  3. Bilateral knee pain I will get an x-ray for further workup   DVT Prophylaxis: subcutaneous Heparin Nutrition: Advance as tolerated  Code Status: Full  Disposition: Admitted to inpatient in telemetry unit.  Author: Berle Mull, MD [facility] Pager: [phone] 05/28/2013, 5:42 AM    If 7PM-7AM, please contact night-coverage [email or web address] Password [email or web address]

## 2013-05-28 NOTE — Telephone Encounter (Signed)
Received patient call. Patient requesting to speak with Dr. Zigmund Daniel. Informed patient that Dr. Zigmund Daniel is not in the office at this time. Patient states that she is in the ED and is to be admitted and is in a waiting area. Patient inquiring about who she needs to talk to about her care. Instructed patient to speak with the nurse who is currently caring for her in the ED holding area about any concerns that she has and that nurse will be able to contact the doctor currently caring for her needs. Patient acknowledges.

## 2013-05-28 NOTE — Procedures (Signed)
US/fluoroscopic guided right DL basilic vein PICC placed. Length 40 cm. Tip SVC/RA junction. No immediate complications. Ok to use. 

## 2013-05-28 NOTE — ED Provider Notes (Signed)
CSN: [id number]     Arrival date & time 05/27/13  2247 History   First MD Initiated Contact with Patient 05/27/13 2307     Chief Complaint  Patient presents with  . Sickle Cell Pain Crisis    HPI Pt was seen at 2320. Per pt, c/o gradual onset and persistence of constant acute flair of her chronic bilat knees "pain" since earlier today. Pt describes her pain as her usual "sickle cell pains." States she took her home prescription of oxycodone and "vomited it up."  Denies fevers, no injury, no rash, no calf/LE pain or unilateral swelling, no focal motor weakness, no tingling/numbness in extremities, no abd pain, no diarrhea, no CP/SOB.    Past Medical History  Diagnosis Date  . H/O: 1 miscarriage 03/22/2011  . 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 Depression 01/06/2011  . GERD (gastroesophageal reflux disease) 02/17/2011  . Trichotillomania     h/o  . Blood transfusion     "lots"  . Sickle cell anemia with crisis   . Exertional dyspnea     "sometimes"  . Sickle cell anemia   . Headache(784.0)   . Migraines 11/08/11    "@ least twice/month"  . Chronic back pain     "very severe; have knot in my back; from tight muscle; take RX and exercise for it"  . Mood swings 11/08/11    "I go back and forth; real bad"  . Pregnancy   . Blood dyscrasia     SICKLE CELL   Past Surgical History  Procedure Laterality Date  . Cholecystectomy  05/2010  . Dilation and curettage of uterus  02/20/11    S/P miscarriage   Family History  Problem Relation Age of Onset  . Diabetes Mother   . Alcoholism    . Depression    . Hypercholesterolemia    . Hypertension    . Migraines    . Diabetes Maternal Grandmother   . Diabetes Paternal Grandmother    History  Substance Use Topics  . Smoking status: Former Smoker -- 0.25 packs/day for 1 years    Types: Cigarettes    Quit date: 03/25/2013  . Smokeless tobacco: Never Used  . Alcohol Use: Yes     Comment: pt states she quit marijuan in May 2013. Rare ETOH, +  cigarettes.  She is enrolled in school   OB History   Grav Para Term Preterm Abortions TAB SAB Ect Mult Living   2    1  1         Obstetric Comments   Miscarried in October 2012 at about 7 weeks     Review of Systems ROS: Statement: All systems negative except as marked or noted in the HPI; Constitutional: Negative for fever and chills. ; ; Eyes: Negative for eye pain, redness and discharge. ; ; ENMT: Negative for ear pain, hoarseness, nasal congestion, sinus pressure and sore throat. ; ; Cardiovascular: Negative for chest pain, palpitations, diaphoresis, dyspnea and peripheral edema. ; ; Respiratory: Negative for cough, wheezing and stridor. ; ; Gastrointestinal: +N/V x1. Negative for diarrhea, abdominal pain, blood in stool, hematemesis, jaundice and rectal bleeding. . ; ; Genitourinary: Negative for dysuria, flank pain and hematuria. ; ; Musculoskeletal: +bilateral knees pain. Negative for back pain and neck pain. Negative for swelling and trauma.; ; Skin: Negative for pruritus, rash, abrasions, blisters, bruising and skin lesion.; ; Neuro: Negative for headache, lightheadedness and neck stiffness. Negative for weakness, altered level of consciousness , altered mental status, extremity weakness,  paresthesias, involuntary movement, seizure and syncope.      Allergies  Carrot oil and Latex  Home Medications   Current Outpatient Rx  Name  Route  Sig  Dispense  Refill  . folic acid (FOLVITE) 782 MCG tablet   Oral   Take 2 tablets (1,600 mcg total) by mouth every morning.         . hydroxyurea (HYDREA) 500 MG capsule   Oral   Take 2 capsules (1,000 mg total) by mouth at bedtime. May take with food to minimize GI side effects.   60 capsule   3   . methadone (DOLOPHINE) 5 MG tablet   Oral   Take 0.5 tablets (2.5 mg total) by mouth at bedtime.   30 tablet   0   . ondansetron (ZOFRAN) 4 MG tablet   Oral   Take 4 mg by mouth every 6 (six) hours as needed for nausea or vomiting.          . polyethylene glycol (MIRALAX / GLYCOLAX) packet   Oral   Take 17 g by mouth daily as needed (for constipation).         . Prenatal Vit-Fe Fumarate-FA (PRENATAL MULTIVITAMIN) TABS tablet   Oral   Take 1 tablet by mouth daily at 12 noon.         . sertraline (ZOLOFT) 50 MG tablet   Oral   Take 1 tablet (50 mg total) by mouth daily.   30 tablet   3   . Oxycodone HCl 10 MG TABS   Oral   Take 1.5 tablets (15 mg total) by mouth 3 (three) times daily as needed.   90 tablet   0    BP 125/81  Pulse 95  Temp(Src) 100.3 F (37.9 C) (Oral)  Resp 22  SpO2 98%  LMP 05/05/2013 Physical Exam 2325: Physical examination:  Nursing notes reviewed; Vital signs and O2 SAT reviewed;  Constitutional: Well developed, Well nourished, Well hydrated, In no acute distress; Head:  Normocephalic, atraumatic; Eyes: EOMI, PERRL, No scleral icterus; ENMT: Mouth and pharynx normal, Mucous membranes moist; Neck: Supple, Full range of motion, No lymphadenopathy; Cardiovascular: Regular rate and rhythm, No murmur, rub, or gallop; Respiratory: Breath sounds clear & equal bilaterally, No rales, rhonchi, wheezes.  Speaking full sentences with ease, Normal respiratory effort/excursion; Chest: Nontender, Movement normal; Abdomen: Soft, Nontender, Nondistended, Normal bowel sounds; Genitourinary: No CVA tenderness; Extremities: Pulses normal, No tenderness, no deformity, no edema, No calf edema or asymmetry. Bilat knees without edema, erythema, warmth.; Neuro: AA&Ox3, Major CN grossly intact.  Speech clear. No gross focal motor or sensory deficits in extremities.; Skin: Color normal, Warm, Dry.   ED Course  Procedures   EKG Interpretation   None       MDM  MDM Reviewed: previous chart, nursing note and vitals Reviewed previous: labs Interpretation: labs   Results for orders placed during the hospital encounter of 05/27/13  CBC WITH DIFFERENTIAL      Result Value Range   WBC 14.2 (*) 4.0 - 10.5  K/uL   RBC 3.01 (*) 3.87 - 5.11 MIL/uL   Hemoglobin 9.6 (*) 12.0 - 15.0 g/dL   HCT 27.1 (*) 36.0 - 46.0 %   MCV 90.0  78.0 - 100.0 fL   MCH 31.9  26.0 - 34.0 pg   MCHC 35.4  30.0 - 36.0 g/dL   RDW 16.7 (*) 11.5 - 15.5 %   Platelets 466 (*) 150 - 400 K/uL   Neutrophils Relative %  56  43 - 77 %   Lymphocytes Relative 29  12 - 46 %   Monocytes Relative 11  3 - 12 %   Eosinophils Relative 3  0 - 5 %   Basophils Relative 1  0 - 1 %   Neutro Abs 8.0 (*) 1.7 - 7.7 K/uL   Lymphs Abs 4.1 (*) 0.7 - 4.0 K/uL   Monocytes Absolute 1.6 (*) 0.1 - 1.0 K/uL   Eosinophils Absolute 0.4  0.0 - 0.7 K/uL   Basophils Absolute 0.1  0.0 - 0.1 K/uL   RBC Morphology MARKED POLYCHROMASIA     WBC Morphology ATYPICAL LYMPHOCYTES     Smear Review LARGE PLATELETS PRESENT    PREGNANCY, URINE      Result Value Range   Preg Test, Ur NEGATIVE  NEGATIVE  URINALYSIS, ROUTINE W REFLEX MICROSCOPIC      Result Value Range   Color, Urine YELLOW  YELLOW   APPearance CLEAR  CLEAR   Specific Gravity, Urine 1.014  1.005 - 1.030   pH 6.5  5.0 - 8.0   Glucose, UA NEGATIVE  NEGATIVE mg/dL   Hgb urine dipstick NEGATIVE  NEGATIVE   Bilirubin Urine NEGATIVE  NEGATIVE   Ketones, ur NEGATIVE  NEGATIVE mg/dL   Protein, ur NEGATIVE  NEGATIVE mg/dL   Urobilinogen, UA 1.0  0.0 - 1.0 mg/dL   Nitrite NEGATIVE  NEGATIVE   Leukocytes, UA NEGATIVE  NEGATIVE  RETICULOCYTES      Result Value Range   Retic Ct Pct 17.0 (*) 0.4 - 3.1 %   RBC. 3.01 (*) 3.87 - 5.11 MIL/uL   Retic Count, Manual 511.7 (*) 19.0 - 186.0 K/uL  COMPREHENSIVE METABOLIC PANEL      Result Value Range   Sodium 138  137 - 147 mEq/L   Potassium 4.3  3.7 - 5.3 mEq/L   Chloride 100  96 - 112 mEq/L   CO2 24  19 - 32 mEq/L   Glucose, Bld 95  70 - 99 mg/dL   BUN 10  6 - 23 mg/dL   Creatinine, Ser 1.01  0.50 - 1.10 mg/dL   Calcium 9.1  8.4 - 10.5 mg/dL   Total Protein 7.6  6.0 - 8.3 g/dL   Albumin 4.1  3.5 - 5.2 g/dL   AST 25  0 - 37 U/L   ALT 15  0 - 35 U/L    Alkaline Phosphatase 66  39 - 117 U/L   Total Bilirubin 1.7 (*) 0.3 - 1.2 mg/dL   GFR calc non Af Amer 79 (*) >90 mL/min   GFR calc Af Amer >90  >90 mL/min  LIPASE, BLOOD      Result Value Range   Lipase 35  11 - 59 U/L     0345:  Pt has been given multiple doses of PO, IM and IV pain medications. States her pain is improving but she "still can't walk" due to her continued knees pain. Pt is requesting admission. Labs are per her baseline. Has tol PO well while in the ED without N/V. T/C to [facility] Dr. Posey Pronto, case discussed, including:  HPI, pertinent PM/SHx, VS/PE, dx testing, ED course and treatment:  Agreeable to admit, requests to write temporary orders, obtain medical bed to team 8.        Alfonzo Feller, DO 05/28/13 [phone]

## 2013-05-28 NOTE — Progress Notes (Signed)
SICKLE CELL SERVICE PROGRESS NOTE  [name] MVH:[id number] DOB: 1990-09-06 DOA: 05/27/2013 PCP: MATTHEWS,MICHELLE A., MD  Assessment/Plan: Principal Problem:   Sickle cell pain crisis Active Problems:   Leg pain   Sickle cell anemia with pain  1. Hb SS with crisis: Pt will betreated with IVF, Toradol and IV analgesics. Pt will be initially treated with weight based rapid re-doing and then transitioned to a weight based Dilaudid PCA when she is transferred to the ward. Pain is currently rated as 9/10 and localized mostly to the knees.  Will re-evaluate pain tomorrow. She will likely require a PICC line placement by IR. 2. SCD: Continue Hydrea and Folic acid. 3. Leukocytosis: Pt has no evidence of infection and this is likely related to sickle cel crisis. Will observe off antibiotics and recheck tomorrow. 4. F/E/N: pt will receive hypotonic fluids D5.45@125  ml/hr for cell hydration in a setting of hydrophobic sickle cells. 5. Constipation: Pt has not had a BM since yesterday. I anticipate probable continued constipation in the light if increased opiate use. Will order Senna-S and Mirialax. 6. IV access: Pt has very poor peripheral IV access and we have spoken about having a port placed. She will probably have to have a PICC line placed by IR as IV team has repeatedly been unable to place line. Code Status: Full Code Family Communication: N/A Disposition Plan: Pt just admitted.  MATTHEWS,MICHELLE A.  Pager [phone]. If 7PM-7AM, please contact night-coverage.  05/28/2013, 10:41 AM  LOS: 1 day   Brief narrative: Pt admitted with acute sickle cell crisis. Pt states that she used her oral medications and thought that she could manage with just oral medications. However, pain escalated last night and she could not manage at home. She came to the ED for further evaluation and management. She has had no cough,  fevers, chills, nausea. Vomiting or  diarrhea.  Consultants:  None  Procedures:  None  Antibiotics:  None  HPI/Subjective: Pt reports pain 9/10 and localized to knees and back. However knees more than back leading to impeded amnbulation.  Objective: Filed Vitals:   05/27/13 2251 05/28/13 0359 05/28/13 0940  BP: 125/81 106/57 105/74  Pulse: 95 101 88  Temp: 100.3 F (37.9 C) 98.1 F (36.7 C) 98.3 F (36.8 C)  TempSrc: Oral Oral Oral  Resp: 22 18   SpO2: 98% 95% 100%   Weight change:  No intake or output data in the 24 hours ending 05/28/13 1041  General: Alert, awake, oriented x3, in no acute distress.  HEENT: [place]/AT PEERL, EOMI, mild icterus Neck: Trachea midline,  no masses, no thyromegal,y no JVD, no carotid bruit OROPHARYNX:  Moist, No exudate/ erythema/lesions.  Heart: Regular rate and rhythm, without murmurs, rubs, gallops, PMI non-displaced, no heaves or thrills on palpation.  Lungs: Clear to auscultation, no wheezing or rhonchi noted.  Abdomen: Soft, nontender, nondistended, positive bowel sounds, no masses no hepatosplenomegaly noted..  Neuro: No focal neurological deficits noted cranial nerves II through XII grossly intact. DTRs 2+ bilaterally upper and lower extremities. Strength normal in bilateral upper and lower extremities. Musculoskeletal: No warm swelling or erythema around joints, no spinal tenderness noted. Psychiatric: Patient alert and oriented x3, good insight and cognition, good recent to remote recall.    Data Reviewed: Basic Metabolic Panel:  Recent Labs Lab 05/28/13 0005 05/28/13 0610  NA 138 140  K 4.3 4.3  CL 100 104  CO2 24 23  GLUCOSE 95 96  BUN 10 10  CREATININE 1.01 0.85  CALCIUM 9.1 8.7  MG  --  2.0   Liver Function Tests:  Recent Labs Lab 05/28/13 0005 05/28/13 0610  AST 25 24  ALT 15 12  ALKPHOS 66 57  BILITOT 1.7* 1.9*  PROT 7.6 6.6  ALBUMIN 4.1 3.6    Recent Labs Lab 05/28/13 0005  LIPASE 35   No results found for this basename: AMMONIA,   in the last 168 hours CBC:  Recent Labs Lab 05/28/13 0005 05/28/13 0610  WBC 14.2* 15.0*  NEUTROABS 8.0* 8.2*  HGB 9.6* 8.3*  HCT 27.1* 23.2*  MCV 90.0 88.2  PLT 466* 386   Cardiac Enzymes: No results found for this basename: CKTOTAL, CKMB, CKMBINDEX, TROPONINI,  in the last 168 hours BNP (last 3 results) No results found for this basename: PROBNP,  in the last 8760 hours CBG: No results found for this basename: GLUCAP,  in the last 168 hours  No results found for this or any previous visit (from the past 240 hour(s)).   Studies: Dg Knee Complete 4 Views Left  05/28/2013   CLINICAL DATA:  Bilateral knee pain with swelling  EXAM: LEFT KNEE - COMPLETE 4+ VIEW  COMPARISON:  None.  FINDINGS: Mild sclerosis in the medial femoral condyle which may be related to bone infarct in the setting of sickle cell disease. No acute fracture, effusion, or malalignment. No degenerative change.  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   Electronically Signed   By: Jorje Guild M.D.   On: 05/28/2013 05:58   Dg Knee Complete 4 Views Right  05/28/2013   CLINICAL DATA:  Sickle cell crisis with knee pain and swelling.  EXAM: RIGHT KNEE - COMPLETE 4+ VIEW  COMPARISON:  06/21/2011  FINDINGS: Patchy areas of increased density in the distal femur and proximal tibia are stable from prior, evaluated by intervening CT. No acute fracture malalignment. No joint effusion.  IMPRESSION: Stable exam.  No acute osseous abnormality.   Electronically Signed   By: Jorje Guild M.D.   On: 05/28/2013 06:03    Scheduled Meds: . folic acid  1.5 mg Oral q morning - 10a  . heparin  5,000 Units Subcutaneous Q8H  .  HYDROmorphone (DILAUDID) injection  2 mg Intravenous Once  . HYDROmorphone PCA 0.3 mg/mL   Intravenous Q4H  . hydroxyurea  1,000 mg Oral QHS  . ketorolac  30 mg Intravenous Q6H  . methadone  2.5 mg Oral QHS  . prenatal multivitamin  1  tablet Oral Q1200  . senna-docusate  1 tablet Oral BID  . sertraline  50 mg Oral Daily   Continuous Infusions: . dextrose 5 % and 0.45% NaCl      Total time spent 36 minutes

## 2013-05-29 DIAGNOSIS — D72829 Elevated white blood cell count, unspecified: Secondary | ICD-10-CM | POA: Diagnosis not present

## 2013-05-29 DIAGNOSIS — G894 Chronic pain syndrome: Secondary | ICD-10-CM | POA: Diagnosis not present

## 2013-05-29 DIAGNOSIS — K219 Gastro-esophageal reflux disease without esophagitis: Secondary | ICD-10-CM | POA: Diagnosis not present

## 2013-05-29 DIAGNOSIS — D5700 Hb-SS disease with crisis, unspecified: Secondary | ICD-10-CM | POA: Diagnosis not present

## 2013-05-29 LAB — CBC WITH DIFFERENTIAL/PLATELET
BASOS ABS: 0.2 10*3/uL — AB (ref 0.0–0.1)
Basophils Relative: 1 % (ref 0–1)
Eosinophils Absolute: 0.8 10*3/uL — ABNORMAL HIGH (ref 0.0–0.7)
Eosinophils Relative: 5 % (ref 0–5)
HCT: 24.4 % — ABNORMAL LOW (ref 36.0–46.0)
Hemoglobin: 8.3 g/dL — ABNORMAL LOW (ref 12.0–15.0)
LYMPHS ABS: 4.3 10*3/uL — AB (ref 0.7–4.0)
Lymphocytes Relative: 27 % (ref 12–46)
MCH: 30.5 pg (ref 26.0–34.0)
MCHC: 34 g/dL (ref 30.0–36.0)
MCV: 89.7 fL (ref 78.0–100.0)
MONO ABS: 1.8 10*3/uL — AB (ref 0.1–1.0)
Monocytes Relative: 11 % (ref 3–12)
Neutro Abs: 8.9 10*3/uL — ABNORMAL HIGH (ref 1.7–7.7)
Neutrophils Relative %: 56 % (ref 43–77)
Platelets: 445 10*3/uL — ABNORMAL HIGH (ref 150–400)
RBC: 2.72 MIL/uL — ABNORMAL LOW (ref 3.87–5.11)
RDW: 17 % — AB (ref 11.5–15.5)
WBC: 16 10*3/uL — AB (ref 4.0–10.5)

## 2013-05-29 MED ORDER — HYDROMORPHONE HCL PF 2 MG/ML IJ SOLN
2.0000 mg | INTRAMUSCULAR | Status: AC | PRN
  Administered 2013-05-29 (×2): 2 mg via INTRAVENOUS
  Filled 2013-05-29 (×2): qty 1

## 2013-05-29 MED ORDER — SODIUM CHLORIDE 0.9 % IJ SOLN
10.0000 mL | INTRAMUSCULAR | Status: DC | PRN
  Administered 2013-05-29 – 2013-05-31 (×4): 10 mL

## 2013-05-29 NOTE — Progress Notes (Signed)
Subjective: A 23 yo admitted with sickle cell Painful crisis. Complaining of 8/10 pain in her limbs and back. Not using much of the Dilaudid PCA pump. Has been having back spasms. No fever, no NVD. No other complaints.  Objective: Vital signs in last 24 hours: Temp:  [97.9 F (36.6 C)-98.7 F (37.1 C)] 98.7 F (37.1 C) (01/14 1426) Pulse Rate:  [88-108] 95 (01/14 1426) Resp:  [11-18] 18 (01/14 1426) BP: (100-123)/(43-58) 112/46 mmHg (01/14 1426) SpO2:  [94 %-100 %] 95 % (01/14 1426) Weight change:  Last BM Date: 05/27/13  Intake/Output from previous day: 01/13 0701 - 01/14 0700 In: 2400 [P.O.:480; I.V.:1920] Out: 1100 [Urine:1100] Intake/Output this shift: Total I/O In: 440 [P.O.:440] Out: 1050 [Urine:1050]  General appearance: alert, cooperative and no distress Eyes: conjunctivae/corneas clear. PERRL, EOM's intact. Fundi benign. Neck: no adenopathy, no carotid bruit, no JVD, supple, symmetrical, trachea midline and thyroid not enlarged, symmetric, no tenderness/mass/nodules Back: symmetric, no curvature. ROM normal. No CVA tenderness. Resp: clear to auscultation bilaterally Cardio: regular rate and rhythm, S1, S2 normal, no murmur, click, rub or gallop GI: soft, non-tender; bowel sounds normal; no masses,  no organomegaly Extremities: extremities normal, atraumatic, no cyanosis or edema Pulses: 2+ and symmetric Skin: Skin color, texture, turgor normal. No rashes or lesions Neurologic: Grossly normal  Lab Results:  Recent Labs  05/28/13 0610 05/29/13 0440  WBC 15.0* 16.0*  HGB 8.3* 8.3*  HCT 23.2* 24.4*  PLT 386 445*   BMET  Recent Labs  05/28/13 0005 05/28/13 0610  NA 138 140  K 4.3 4.3  CL 100 104  CO2 24 23  GLUCOSE 95 96  BUN 10 10  CREATININE 1.01 0.85  CALCIUM 9.1 8.7    Studies/Results: Ir Fluoro Guide Cv Line Right  05/28/2013   CLINICAL DATA:  Sickle cell crisis ; central venous access is requested for fluids and medications.  EXAM: IR RIGHT  FLUORO GUIDE CV LINE; IR US GUIDED VASC ACCESS RIGHT  TECHNIQUE: The right arm was prepped with chlorhexidine, draped in the usual sterile fashion using maximum barrier technique (cap and mask, sterile gown, sterile gloves, large sterile sheet, hand hygiene and cutaneous antiseptic). Local anesthesia was attained by infiltration with 1% lidocaine.  Ultrasound demonstrated patency of the right basilic vein, and this was documented with an image. Under real-time ultrasound guidance, this vein was accessed with a 21 gauge micropuncture needle and image documentation was performed. The needle was exchanged over a guidewire for a peel-away sheath through which a 40 cm 5 [place] double lumen power injectable PICC was advanced, and positioned with its tip at the lower SVC/right atrial junction. Fluoroscopy during the procedure and fluoro spot radiograph confirms appropriate catheter position. The catheter was flushed, secured to the skin with Prolene sutures, and covered with a sterile dressing.  FLUOROSCOPY TIME:  84 seconds  COMPLICATIONS: None.  The patient tolerated the procedure well.  IMPRESSION: Successful placement of a right arm PICC with sonographic and fluoroscopic guidance. The catheter is ready for use.  Read by: Rowe Robert ,P.A.-C.   Electronically Signed   By: Sandi Mariscal M.D.   On: 05/28/2013 16:36   Dg Knee Complete 4 Views Left  05/28/2013   CLINICAL DATA:  Bilateral knee pain with swelling  EXAM: LEFT KNEE - COMPLETE 4+ VIEW  COMPARISON:  None.  FINDINGS: Mild sclerosis in the medial femoral condyle which may be related to bone infarct in the setting of sickle cell disease. No acute fracture, effusion, or  malalignment. No degenerative change.  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   Electronically Signed   By: Jorje Guild M.D.   On: 05/28/2013 05:58   Dg Knee Complete 4 Views Right  05/28/2013   CLINICAL DATA:  Sickle cell  crisis with knee pain and swelling.  EXAM: RIGHT KNEE - COMPLETE 4+ VIEW  COMPARISON:  06/21/2011  FINDINGS: Patchy areas of increased density in the distal femur and proximal tibia are stable from prior, evaluated by intervening CT. No acute fracture malalignment. No joint effusion.  IMPRESSION: Stable exam.  No acute osseous abnormality.   Electronically Signed   By: Jorje Guild M.D.   On: 05/28/2013 06:03    Medications: I have reviewed the patient's current medications.  Assessment/Plan: A 23 YO admitted with sickle cell Painful crisis.  #1 Sickle cell painful crisis: Patient not yet adjusted to the PCA pump. Will therefore give IV dilaudid boluses x 2 PRN today. Continue to encourage the use of the PCA. Consider PT/OT consult.  #2 Sickle cell Anemia: H/H stable  #3 Leg Pain: X rays negative. Probably all due to [facility].   LOS: 2 days   Lorina Duffner,LAWAL 05/29/2013, 3:28 PM

## 2013-05-29 NOTE — Progress Notes (Signed)
Patient refusing to put the telemetry leads back on at this time. Will try to get them on at a later time.

## 2013-05-30 LAB — CBC WITH DIFFERENTIAL/PLATELET
Basophils Absolute: 0.1 10*3/uL (ref 0.0–0.1)
Basophils Relative: 0 % (ref 0–1)
Eosinophils Absolute: 1.2 10*3/uL — ABNORMAL HIGH (ref 0.0–0.7)
Eosinophils Relative: 7 % — ABNORMAL HIGH (ref 0–5)
HCT: 23 % — ABNORMAL LOW (ref 36.0–46.0)
HEMOGLOBIN: 7.9 g/dL — AB (ref 12.0–15.0)
LYMPHS PCT: 22 % (ref 12–46)
Lymphs Abs: 3.6 10*3/uL (ref 0.7–4.0)
MCH: 31.1 pg (ref 26.0–34.0)
MCHC: 34.3 g/dL (ref 30.0–36.0)
MCV: 90.6 fL (ref 78.0–100.0)
MONOS PCT: 8 % (ref 3–12)
Monocytes Absolute: 1.3 10*3/uL — ABNORMAL HIGH (ref 0.1–1.0)
NEUTROS ABS: 10.5 10*3/uL — AB (ref 1.7–7.7)
Neutrophils Relative %: 63 % (ref 43–77)
Platelets: 432 10*3/uL — ABNORMAL HIGH (ref 150–400)
RBC: 2.54 MIL/uL — ABNORMAL LOW (ref 3.87–5.11)
RDW: 17.5 % — ABNORMAL HIGH (ref 11.5–15.5)
WBC: 16.5 10*3/uL — AB (ref 4.0–10.5)

## 2013-05-30 LAB — COMPREHENSIVE METABOLIC PANEL
ALT: 15 U/L (ref 0–35)
AST: 24 U/L (ref 0–37)
Albumin: 3.5 g/dL (ref 3.5–5.2)
Alkaline Phosphatase: 58 U/L (ref 39–117)
BUN: 8 mg/dL (ref 6–23)
CALCIUM: 8.5 mg/dL (ref 8.4–10.5)
CO2: 26 meq/L (ref 19–32)
CREATININE: 0.72 mg/dL (ref 0.50–1.10)
Chloride: 102 mEq/L (ref 96–112)
GFR calc Af Amer: >90 mL/min (ref >90)
GFR calc non Af Amer: >90 mL/min (ref >90)
GLUCOSE: 87 mg/dL (ref 70–99)
Potassium: 4 mEq/L (ref 3.7–5.3)
Sodium: 139 mEq/L (ref 137–147)
Total Bilirubin: 2.4 mg/dL — ABNORMAL HIGH (ref 0.3–1.2)
Total Protein: 6.6 g/dL (ref 6.0–8.3)

## 2013-05-30 MED ORDER — METHADONE HCL 5 MG PO TABS
5.0000 mg | ORAL_TABLET | Freq: Every day | ORAL | Status: DC
  Administered 2013-05-30 – 2013-05-31 (×2): 5 mg via ORAL
  Filled 2013-05-30 (×2): qty 1

## 2013-05-30 MED ORDER — FOLIC ACID 1 MG PO TABS
1.0000 mg | ORAL_TABLET | Freq: Every morning | ORAL | Status: DC
  Administered 2013-05-31 – 2013-06-01 (×2): 1 mg via ORAL
  Filled 2013-05-30 (×3): qty 1

## 2013-05-30 NOTE — Progress Notes (Signed)
SICKLE CELL SERVICE PROGRESS NOTE  BRETTE CAST ION:[id number] DOB: 1990/12/25 DOA: 05/27/2013 PCP: MATTHEWS,MICHELLE A., MD  Assessment/Plan: Principal Problem:   Sickle cell pain crisis Active Problems:   Leg pain   Sickle cell anemia with pain  1. Hb SS with crisis: Pt with acute vaso-occlusive episode with pain localized to knees and back. Pt rates pain as 7/10 in knees and 8/10 in low back. Pt is presently on  a weight based Dilaudid PCA. She has had a total use of 34.24 mg with 54 demands and 45 deliveries. Additionally,  pt is being treated with IVF and Toradol . I will increase Methadone to 5 mg as she has been taking 5 mg at HS for the last 3 weeks and continue her PCA at the current dose. I anticipate that she will improve with increased dose of Methadone. The goal for this patient remains being off of all long acting opiates. 2. Leukocytosis: Associated with acute crisis. No evidence of infection. Will continue to monitor. 3. Acute hemolytic anemia: Associated with acte crisis although patient's episodes are largely vaso-occlusive. No indication for transfusion at this time.  Code Status: Full Code Family Communication: N/A Disposition Plan: Anticipate discharge in 48-72 hours.  MATTHEWS,MICHELLE A.  Pager [phone]. If 7PM-7AM, please contact night-coverage.  05/30/2013, 12:29 PM  LOS: 3 days   Brief narrative: Pt admitted with acute sickle cell crisis. Pt states that she used her oral medications and thought that she could manage with just oral medications. However, pain escalated last night and she could not manage at home. She came to the ED for further evaluation and management. She has had no cough, fevers, chills, nausea. Vomiting or diarrhea   Consultants:  None  Procedures:  None  Antibiotics:  None  HPI/Subjective: Pt is alert and appears in NAD. She  rates pain as 7/10 in knees and 8/10 in low back. Her last BM was 48 hours ago. She is tolerating diet  and has had no emesis and no further nausea  since admission. Objective: Filed Vitals:   05/30/13 0604 05/30/13 0633 05/30/13 0800 05/30/13 1213  BP: 127/58     Pulse: 92     Temp: 98.7 F (37.1 C)     TempSrc: Oral     Resp: 16 11 13 11   Height:      Weight:      SpO2: 100% 98%  46%   Weight change:   Intake/Output Summary (Last 24 hours) at 05/30/13 1229 Last data filed at 05/30/13 0900  Gross per 24 hour  Intake 3866.87 ml  Output    500 ml  Net 3366.87 ml    General: Alert, awake, oriented x3, in no acute distress.  HEENT: [place]/AT PEERL, EOMI, anicteric Heart: Regular rate and rhythm, without murmurs, rubs, gallops,.  Lungs: Clear to auscultation, no wheezing or rhonchi noted. Abdomen: Soft, nontender, nondistended, positive bowel sounds, no masses no hepatosplenomegaly noted.  Neuro: No focal neurological deficits noted cranial nerves II through XII grossly intact.  Strength 5/5 in bilateral upper and lower extremities. Musculoskeletal: No warm swelling or erythema around joints, no spinal tenderness noted. Psychiatric: Patient alert and oriented x3, good insight and cognition, good recent to remote recall.    Data Reviewed: Basic Metabolic Panel:  Recent Labs Lab 05/28/13 0005 05/28/13 0610 05/30/13 0442  NA 138 140 139  K 4.3 4.3 4.0  CL 100 104 102  CO2 24 23 26   GLUCOSE 95 96 87  BUN 10 10  8  CREATININE 1.01 0.85 0.72  CALCIUM 9.1 8.7 8.5  MG  --  2.0  --    Liver Function Tests:  Recent Labs Lab 05/28/13 0005 05/28/13 0610 05/30/13 0442  AST 25 24 24   ALT 15 12 15   ALKPHOS 66 57 58  BILITOT 1.7* 1.9* 2.4*  PROT 7.6 6.6 6.6  ALBUMIN 4.1 3.6 3.5    Recent Labs Lab 05/28/13 0005  LIPASE 35   No results found for this basename: AMMONIA,  in the last 168 hours CBC:  Recent Labs Lab 05/28/13 0005 05/28/13 0610 05/29/13 0440 05/30/13 0442  WBC 14.2* 15.0* 16.0* 16.5*  NEUTROABS 8.0* 8.2* 8.9* 10.5*  HGB 9.6* 8.3* 8.3* 7.9*  HCT  27.1* 23.2* 24.4* 23.0*  MCV 90.0 88.2 89.7 90.6  PLT 466* 386 445* 432*   Cardiac Enzymes: No results found for this basename: CKTOTAL, CKMB, CKMBINDEX, TROPONINI,  in the last 168 hours BNP (last 3 results) No results found for this basename: PROBNP,  in the last 8760 hours CBG: No results found for this basename: GLUCAP,  in the last 168 hours  Recent Results (from the past 240 hour(s))  MRSA PCR SCREENING     Status: None   Collection Time    05/28/13 11:08 AM      Result Value Range Status   MRSA by PCR NEGATIVE  NEGATIVE Final   Comment:            The GeneXpert MRSA Assay (FDA     approved for NASAL specimens     only), is one component of a     comprehensive MRSA colonization     surveillance program. It is not     intended to diagnose MRSA     infection nor to guide or     monitor treatment for     MRSA infections.     Studies: Ir Fluoro Guide Cv Line Right  05/28/2013   CLINICAL DATA:  Sickle cell crisis ; central venous access is requested for fluids and medications.  EXAM: IR RIGHT FLUORO GUIDE CV LINE; IR US GUIDED VASC ACCESS RIGHT  TECHNIQUE: The right arm was prepped with chlorhexidine, draped in the usual sterile fashion using maximum barrier technique (cap and mask, sterile gown, sterile gloves, large sterile sheet, hand hygiene and cutaneous antiseptic). Local anesthesia was attained by infiltration with 1% lidocaine.  Ultrasound demonstrated patency of the right basilic vein, and this was documented with an image. Under real-time ultrasound guidance, this vein was accessed with a 21 gauge micropuncture needle and image documentation was performed. The needle was exchanged over a guidewire for a peel-away sheath through which a 40 cm 5 [place] double lumen power injectable PICC was advanced, and positioned with its tip at the lower SVC/right atrial junction. Fluoroscopy during the procedure and fluoro spot radiograph confirms appropriate catheter position. The  catheter was flushed, secured to the skin with Prolene sutures, and covered with a sterile dressing.  FLUOROSCOPY TIME:  84 seconds  COMPLICATIONS: None.  The patient tolerated the procedure well.  IMPRESSION: Successful placement of a right arm PICC with sonographic and fluoroscopic guidance. The catheter is ready for use.  Read by: Rowe Robert ,P.A.-C.   Electronically Signed   By: Sandi Mariscal M.D.   On: 05/28/2013 16:36   Dg Knee Complete 4 Views Left  05/28/2013   CLINICAL DATA:  Bilateral knee pain with swelling  EXAM: LEFT KNEE - COMPLETE 4+ VIEW  COMPARISON:  None.  FINDINGS: Mild sclerosis in the medial femoral condyle which may be related to bone infarct in the setting of sickle cell disease. No acute fracture, effusion, or malalignment. No degenerative change.  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   Electronically Signed   By: Jorje Guild M.D.   On: 05/28/2013 05:58   Dg Knee Complete 4 Views Right  05/28/2013   CLINICAL DATA:  Sickle cell crisis with knee pain and swelling.  EXAM: RIGHT KNEE - COMPLETE 4+ VIEW  COMPARISON:  06/21/2011  FINDINGS: Patchy areas of increased density in the distal femur and proximal tibia are stable from prior, evaluated by intervening CT. No acute fracture malalignment. No joint effusion.  IMPRESSION: Stable exam.  No acute osseous abnormality.   Electronically Signed   By: Jorje Guild M.D.   On: 05/28/2013 06:03    Scheduled Meds: . folic acid  1.5 mg Oral q morning - 10a  . heparin  5,000 Units Subcutaneous Q8H  . HYDROmorphone PCA 0.3 mg/mL   Intravenous Q4H  . hydroxyurea  1,000 mg Oral QHS  . ketorolac  30 mg Intravenous Q6H  . methadone  2.5 mg Oral QHS  . prenatal multivitamin  1 tablet Oral Q1200  . senna-docusate  1 tablet Oral BID  . sertraline  50 mg Oral Daily   Continuous Infusions: . dextrose 5 % and 0.45% NaCl 100 mL/hr at 05/30/13 0821    Total time 45  minutes.

## 2013-05-31 DIAGNOSIS — D5700 Hb-SS disease with crisis, unspecified: Secondary | ICD-10-CM | POA: Diagnosis not present

## 2013-05-31 MED ORDER — HYDROMORPHONE HCL PF 2 MG/ML IJ SOLN
2.0000 mg | Freq: Once | INTRAMUSCULAR | Status: AC
  Administered 2013-05-31: 2 mg via INTRAVENOUS
  Filled 2013-05-31: qty 1

## 2013-05-31 MED ORDER — ACETAMINOPHEN 325 MG PO TABS: 650.0000 mg | ORAL_TABLET | Freq: Four times a day (QID) | ORAL | Status: DC | PRN

## 2013-05-31 MED ORDER — ACETAMINOPHEN 325 MG PO TABS
650.0000 mg | ORAL_TABLET | Freq: Four times a day (QID) | ORAL | Status: DC | PRN
  Administered 2013-05-31: 650 mg via ORAL
  Filled 2013-05-31: qty 2

## 2013-05-31 NOTE — Care Management Note (Signed)
    Page 1 of 1   05/31/2013     3:34:45 PM   CARE MANAGEMENT NOTE 05/31/2013  Patient:  [name], [name]   Account Number:  [id number]  Date Initiated:  05/29/2013  Documentation initiated by:  Gabriel Earing  Subjective/Objective Assessment:   Pt admitted for Sickle Cell Crisis     Action/Plan:   from home   Anticipated DC Date:  06/02/2013   Anticipated DC Plan:  HOME/SELF CARE         Choice offered to / List presented to:             Status of service:  Completed, signed off Medicare Important Message given?  NO (If response is "NO", the following Medicare IM given date fields will be blank) Date Medicare IM given:   Date Additional Medicare IM given:    Discharge Disposition:  HOME/SELF CARE  Per UR Regulation:  Reviewed for med. necessity/level of care/duration of stay  If discussed at [place] of Stay Meetings, dates discussed:    Comments:  05/29/13 [place], RN, BSN chart reviewed.

## 2013-05-31 NOTE — Progress Notes (Signed)
Subjective: A 23 yo admitted with sickle cell Painful crisis. Still complaining of 6/10 pain in her limbs and back. She has used a Dilaudid PCA as prescribed and has used 25 mg in the last 24 hours was 22 demands in the last 12 hours. She is feeling a little better.  Objective: Vital signs in last 24 hours: Temp:  [98.3 F (36.8 C)-99.1 F (37.3 C)] 98.3 F (36.8 C) (01/16 1332) Pulse Rate:  [78-101] 101 (01/16 1332) Resp:  [9-19] 12 (01/16 1332) BP: (100-137)/(63-70) 100/63 mmHg (01/16 1332) SpO2:  [96 %-100 %] 96 % (01/16 1332) Weight change:  Last BM Date: 05/27/13  Intake/Output from previous day: 01/15 0701 - 01/16 0700 In: 1765 [P.O.:960; I.V.:805] Out: -  Intake/Output this shift: Total I/O In: 559.5 [P.O.:240; I.V.:319.5] Out: -   General appearance: alert, cooperative and no distress Eyes: conjunctivae/corneas clear. PERRL, EOM's intact. Fundi benign. Neck: no adenopathy, no carotid bruit, no JVD, supple, symmetrical, trachea midline and thyroid not enlarged, symmetric, no tenderness/mass/nodules Back: symmetric, no curvature. ROM normal. No CVA tenderness. Resp: clear to auscultation bilaterally Cardio: regular rate and rhythm, S1, S2 normal, no murmur, click, rub or gallop GI: soft, non-tender; bowel sounds normal; no masses,  no organomegaly Extremities: extremities normal, atraumatic, no cyanosis or edema Pulses: 2+ and symmetric Skin: Skin color, texture, turgor normal. No rashes or lesions Neurologic: Grossly normal  Lab Results:  Recent Labs  05/29/13 0440 05/30/13 0442  WBC 16.0* 16.5*  HGB 8.3* 7.9*  HCT 24.4* 23.0*  PLT 445* 432*   BMET  Recent Labs  05/30/13 0442  NA 139  K 4.0  CL 102  CO2 26  GLUCOSE 87  BUN 8  CREATININE 0.72  CALCIUM 8.5    Studies/Results: No results found.  Medications: I have reviewed the patient's current medications.  Assessment/Plan: A 23 YO admitted with sickle cell Painful crisis.  #1 Sickle cell  painful crisis: Patient not using her Dilaudid PCA as prescribed. I will give 1 dose of Dilaudid 2 mg for breakthrough pain today. I'll start her oral medications in preparation for discharge tomorrow.  #2 Sickle cell Anemia: Hemoglobin remains stable at this point. Continue to monitor.  #3 Leg Pain: Due to her sickle cell painful crisis. It seems much better now.  LOS: 4 days   Havilah Topor,LAWAL 05/31/2013, 2:00 PM

## 2013-05-31 NOTE — Progress Notes (Signed)
Patient c/o headache. Dr. Jonelle Sidle texted and responded via telephone. T.O.V.for tylenol 650mg  po every 6hours prn headache.

## 2013-06-01 MED ORDER — METHADONE HCL 5 MG PO TABS: 2.5000 mg | ORAL_TABLET | Freq: Every day | ORAL | Status: DC

## 2013-06-01 MED ORDER — METHADONE HCL 5 MG PO TABS: 5.0000 mg | ORAL_TABLET | Freq: Every day | ORAL | Status: DC

## 2013-06-01 MED ORDER — ALUM & MAG HYDROXIDE-SIMETH 200-200-20 MG/5ML PO SUSP
30.0000 mL | ORAL | Status: DC | PRN
  Administered 2013-06-01: 30 mL via ORAL
  Filled 2013-06-01: qty 30

## 2013-06-01 MED ORDER — OXYCODONE HCL 10 MG PO TABS: 15.0000 mg | ORAL_TABLET | Freq: Three times a day (TID) | ORAL | Status: DC | PRN

## 2013-06-01 NOTE — Progress Notes (Signed)
Pt received d/c instructions, presciptions, f/u appts, when to call MD--pt verbalizes understanding. Pt states she already has mychart set up.  Pt awaiting ride.

## 2013-06-01 NOTE — Progress Notes (Signed)
Pt c/o heartburn, MD paged, Mylanta ordered.

## 2013-06-02 IMAGING — CT CT ANGIO CHEST
2 of 7 series · 19 of 36 positions shown · IV contrast (APPLIED)
Comparison: Chest radiograph dated 06/22/2012

CLINICAL DATA: Sickle cell crisis, chest/leg pain

CT ANGIOGRAPHY CHEST
TECHNIQUE: Multidetector CT imaging of the chest using the
standard protocol during bolus administration of intravenous
contrast. Multiplanar reconstructed images including MIPs were
obtained and reviewed to evaluate the vascular anatomy.
Contrast: 100mL OMNIPAQUE IOHEXOL 350 MG/ML SOLN

[Series 7: pulm embolism 1.0 b25f st · axial · 0.63mm/px · z∈[+1008,+1231]mm · 18 of 249 slices shown]
[im 13/249  lung]
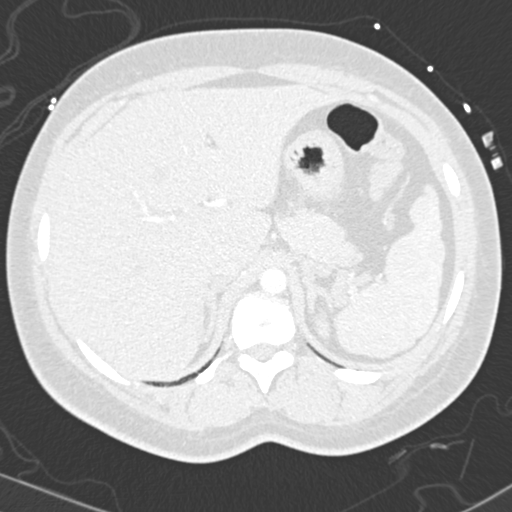
[im 25/249  mediastinal]
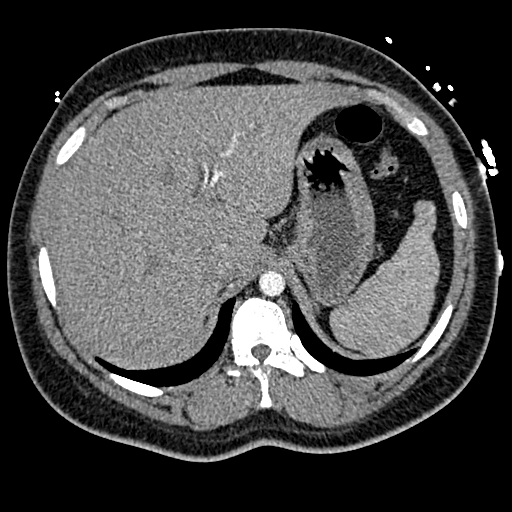
[im 38/249  lung]
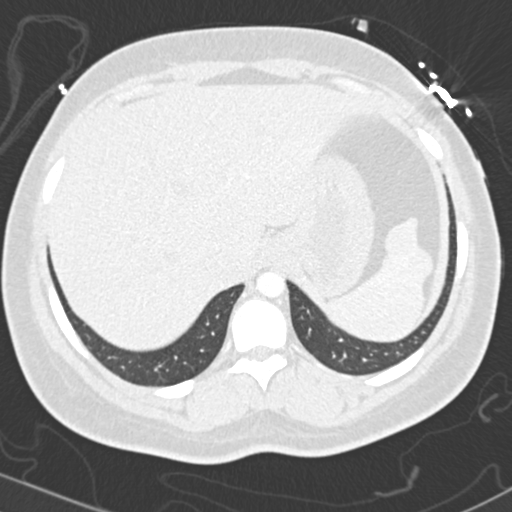
[im 50/249  mediastinal]
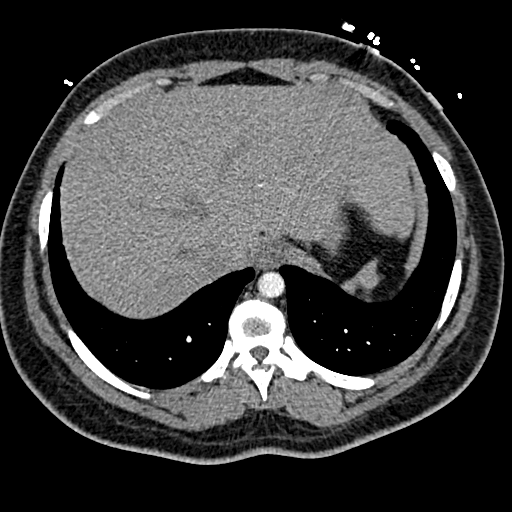
[im 63/249  lung]
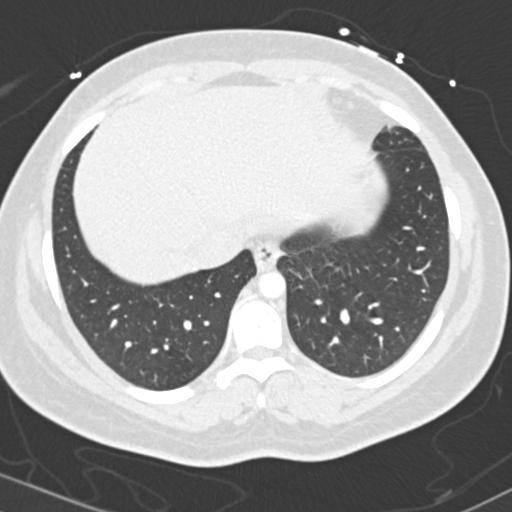
[im 75/249  mediastinal]
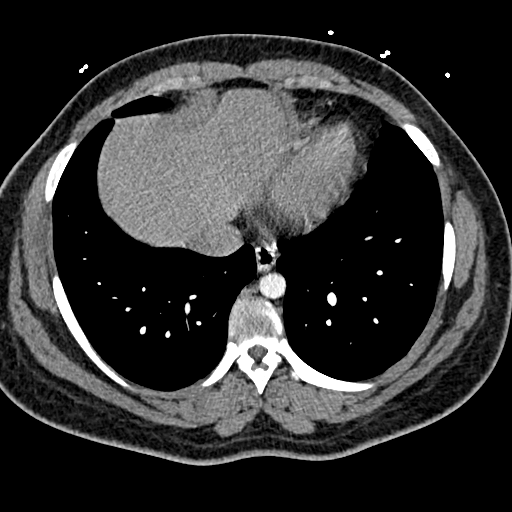
[im 87/249  lung]
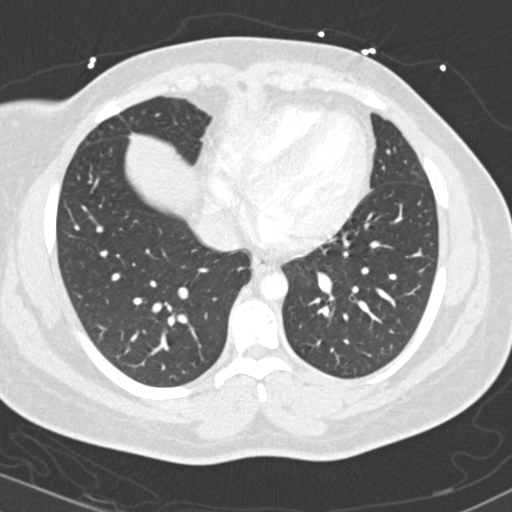
[im 100/249  mediastinal]
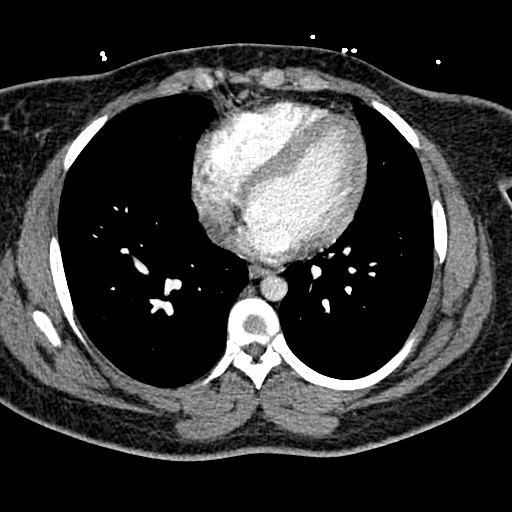
[im 112/249  lung]
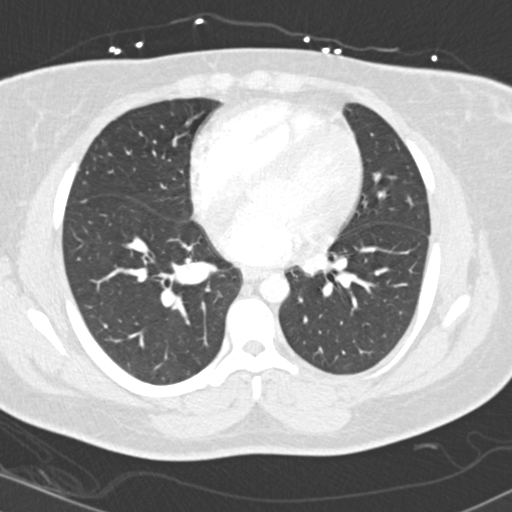
[im 137/249  mediastinal]
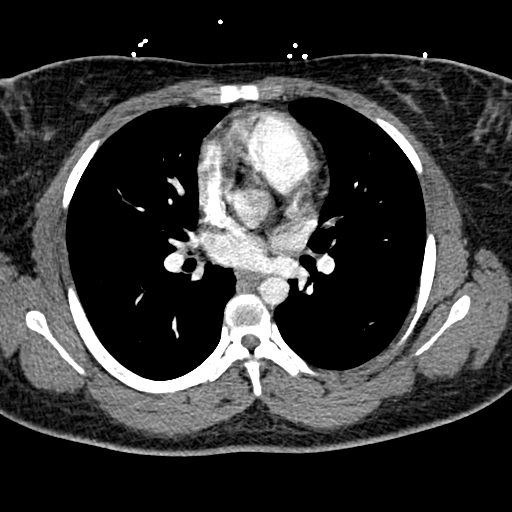
[im 149/249  lung]
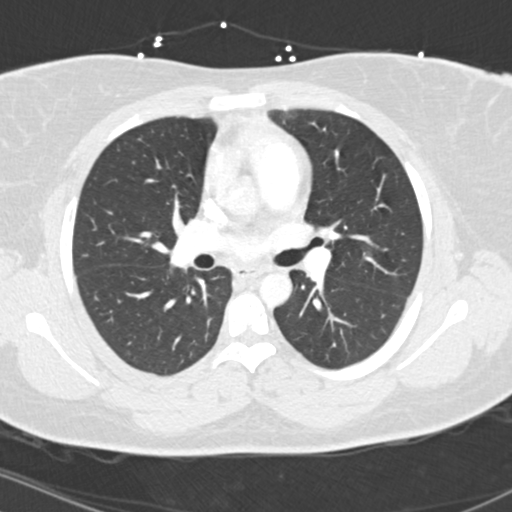
[im 162/249  mediastinal]
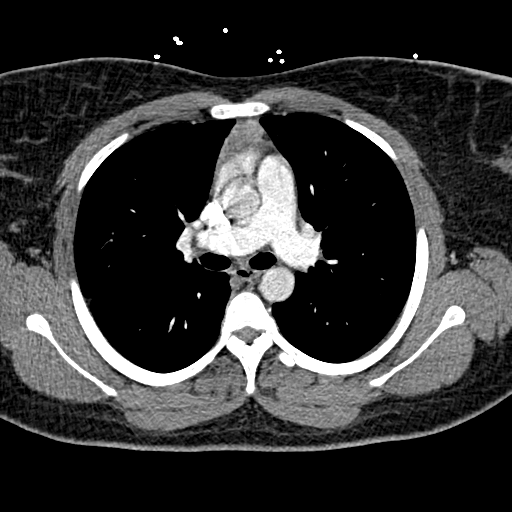
[im 174/249  lung]
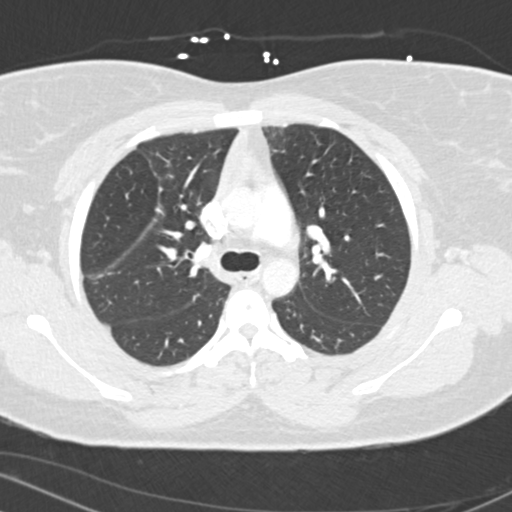
[im 187/249  mediastinal]
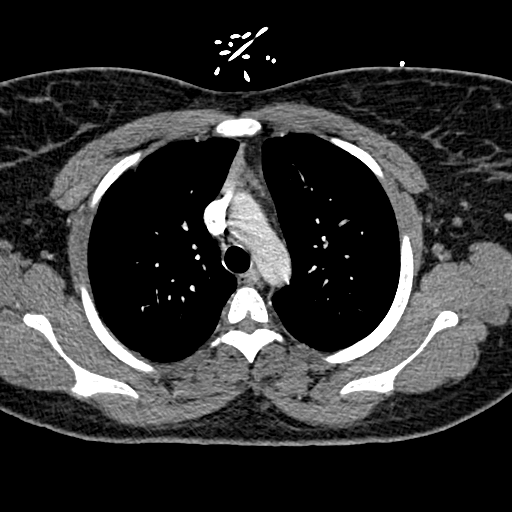
[im 199/249  lung]
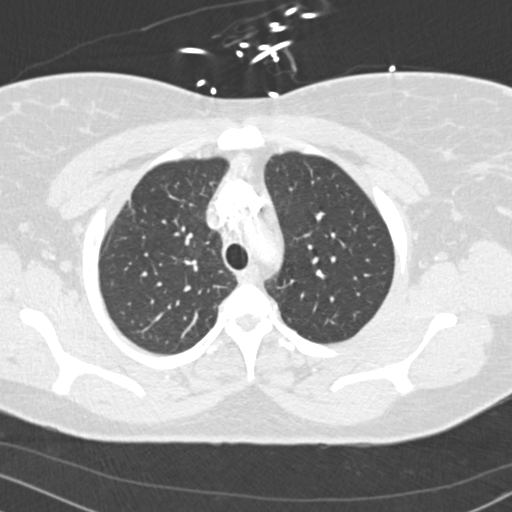
[im 211/249  mediastinal]
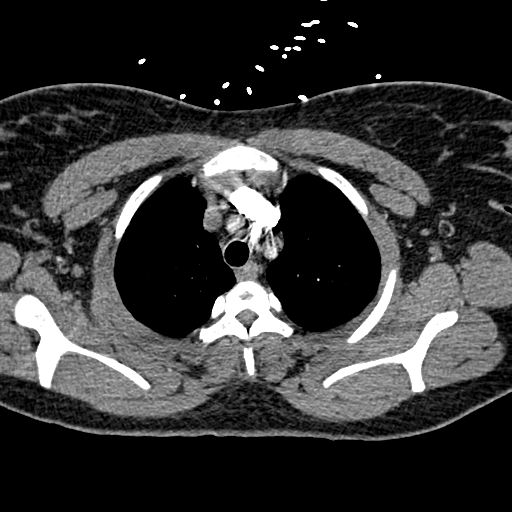
[im 224/249  lung]
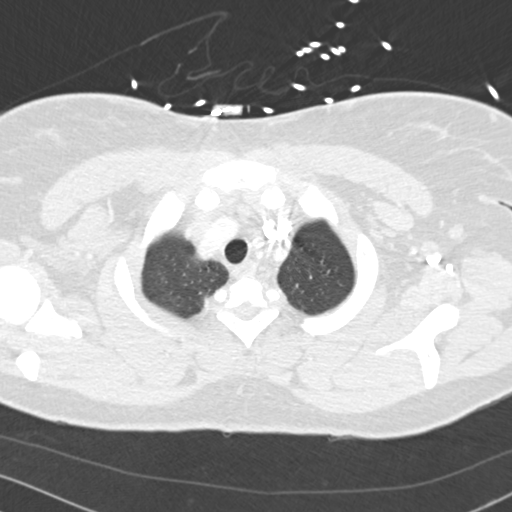
[im 236/249  mediastinal]
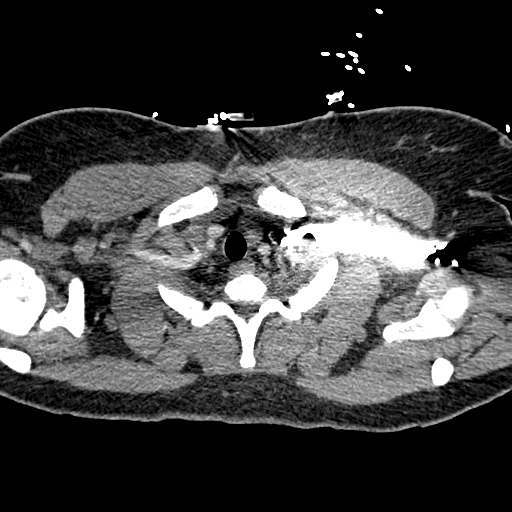

[Series 9: coronals · coronal · 0.57mm/px · 1 of 99 slices shown]
[im 50/99  mediastinal]
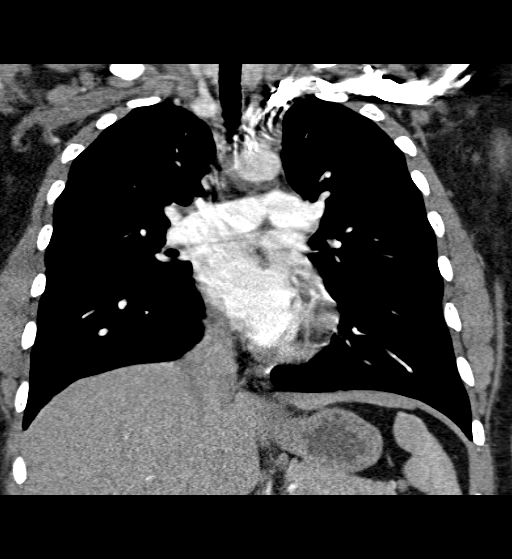

[19 of 36 positions shown; findings below may reference images not displayed]

FINDINGS: No evidence of pulmonary embolism.

The lungs are clear.  3 mm subpleural lymph node along the right
major fissure (series 6/image 47).  Additional probable 4 mm
subpleural lymph node in the superior left lower lobe (series
6/image 38).  Both are unchanged and benign.  No pleural effusion
or pneumothorax.

The visualized thyroid is unremarkable.

The heart is normal in size.  No pericardial effusion.

Residual thymic tissue in the anterior mediastinum (series 5/image
22).

No suspicious mediastinal, hilar, or axillary lymphadenopathy.

Visualized upper abdomen is notable for mild scarring / nodularity
of the spleen.

Superior/inferior endplate vertebral body changes secondary to
sickle cell disease.
IMPRESSION: No evidence of pulmonary embolism.

No evidence of acute cardiopulmonary disease.

## 2013-06-05 ENCOUNTER — Ambulatory Visit: Payer: Medicare Other | Admitting: Internal Medicine

## 2013-06-11 ENCOUNTER — Telehealth: Payer: Self-pay | Admitting: Internal Medicine

## 2013-06-11 DIAGNOSIS — D571 Sickle-cell disease without crisis: Secondary | ICD-10-CM

## 2013-06-11 MED ORDER — HYDROXYUREA 500 MG PO CAPS: 1000.0000 mg | ORAL_CAPSULE | Freq: Every day | ORAL | Status: DC

## 2013-06-11 NOTE — Telephone Encounter (Signed)
Noted Unable to escribe hydrea. To route to Dr. Zigmund Daniel for refill order.

## 2013-06-11 NOTE — Telephone Encounter (Signed)
Refill for Hydrea sent via escribe. To route request to Dr. Zigmund Daniel for oxycodone refill.

## 2013-06-12 MED ORDER — HYDROXYUREA 500 MG PO CAPS: 1000.0000 mg | ORAL_CAPSULE | Freq: Every day | ORAL | Status: DC

## 2013-06-12 NOTE — Telephone Encounter (Addendum)
Hydrea Escribed successfully to pharmacy 06/12/13

## 2013-06-13 MED ORDER — OXYCODONE HCL 10 MG PO TABS: 15.0000 mg | ORAL_TABLET | Freq: Three times a day (TID) | ORAL | Status: DC | PRN

## 2013-06-13 NOTE — Telephone Encounter (Signed)
Prescription for Oxycodone 15 mg (10 mg tabs) # 90 prescribed. Prescription due date id 06/16/2013. However due to pick up date and weekend, prescription issued early.

## 2013-06-14 ENCOUNTER — Telehealth: Payer: Self-pay | Admitting: Family Medicine

## 2013-06-14 ENCOUNTER — Other Ambulatory Visit: Payer: Self-pay | Admitting: Family Medicine

## 2013-06-14 DIAGNOSIS — D571 Sickle-cell disease without crisis: Secondary | ICD-10-CM

## 2013-06-14 MED ORDER — OXYCODONE HCL 10 MG PO TABS: 15.0000 mg | ORAL_TABLET | Freq: Three times a day (TID) | ORAL | Status: DC | PRN

## 2013-06-14 NOTE — Progress Notes (Signed)
Error in printing medication on 06/13/2013, medication re-ordered and re-printed per Dr. Zigmund Daniel.

## 2013-06-14 NOTE — Telephone Encounter (Signed)
Pt will pick up written Rx for Oxycodone will be here in 10 min.

## 2013-06-17 NOTE — Telephone Encounter (Signed)
Printer error with prescription. Reviewed with Dr. Zigmund Daniel via telephone. To reprint prescription.

## 2013-06-17 NOTE — Telephone Encounter (Signed)
Printer error on 06/14/2013. Notified Dr. Zigmund Daniel, case reviewed. Rx written for Oxycodone 10 mg. Take 1.5 tablets (15 mg total by mouth 3 times daily as needed.

## 2013-06-19 ENCOUNTER — Encounter: Payer: Self-pay | Admitting: Internal Medicine

## 2013-06-19 ENCOUNTER — Telehealth: Payer: Self-pay | Admitting: Internal Medicine

## 2013-06-19 ENCOUNTER — Ambulatory Visit (INDEPENDENT_AMBULATORY_CARE_PROVIDER_SITE_OTHER): Payer: Medicare Other | Admitting: Internal Medicine

## 2013-06-19 VITALS — BP 121/68 | HR 82 | Temp 98.1°F | Resp 16 | Ht 62.5 in | Wt 182.0 lb

## 2013-06-19 DIAGNOSIS — Z7251 High risk heterosexual behavior: Secondary | ICD-10-CM

## 2013-06-19 DIAGNOSIS — D5700 Hb-SS disease with crisis, unspecified: Secondary | ICD-10-CM | POA: Diagnosis not present

## 2013-06-19 DIAGNOSIS — Z3009 Encounter for other general counseling and advice on contraception: Secondary | ICD-10-CM

## 2013-06-19 DIAGNOSIS — N898 Other specified noninflammatory disorders of vagina: Secondary | ICD-10-CM

## 2013-06-19 DIAGNOSIS — D571 Sickle-cell disease without crisis: Secondary | ICD-10-CM | POA: Diagnosis not present

## 2013-06-19 DIAGNOSIS — Z30011 Encounter for initial prescription of contraceptive pills: Secondary | ICD-10-CM

## 2013-06-19 NOTE — Telephone Encounter (Signed)
Per Dr. Zigmund Daniel,  left voice mail for patient to come to office for additional labs.

## 2013-06-19 NOTE — Progress Notes (Signed)
   Subjective:    Patient ID: [name], female    DOB: 1991-05-11, 23 y.o.   MRN: [id number]  HPI: Pt here today for follow up pertaining to contraceptive use. Pt has heavy blood loss during her menstrual periods which almost monthly triggers a vaso-occlusive crisis.   She also c/o foul smelling vaginal discharge and states that she has been sexually active without contraception.  Pt also expressed feelings of disappointment within herself for having sexual activity outside of marriage.    Review of Systems  Constitutional: Negative.   HENT: Negative.   Eyes: Negative.   Respiratory: Negative.   Cardiovascular: Negative.   Gastrointestinal: Negative.   Endocrine: Negative.   Genitourinary: Positive for vaginal discharge. Negative for pelvic pain and dyspareunia.  Skin: Negative.   Allergic/Immunologic: Negative.   Neurological: Negative.   Hematological: Negative.   Psychiatric/Behavioral: Positive for dysphoric mood.       Objective:   Physical Exam  Constitutional: She is oriented to person, place, and time. She appears well-developed and well-nourished.  HENT:  Head: Normocephalic and atraumatic.  Eyes: Conjunctivae and EOM are normal. Pupils are equal, round, and reactive to light. No scleral icterus.  Neck: Normal range of motion. Neck supple. No JVD present. No thyromegaly present.  Cardiovascular: Normal rate and regular rhythm.  Exam reveals no gallop and no friction rub.   No murmur heard. Pulmonary/Chest: Effort normal and breath sounds normal. She has no wheezes. She has no rales.  Abdominal: Soft. Bowel sounds are normal. She exhibits no distension and no mass. There is no tenderness.  Genitourinary: Vaginal discharge (Thin foul smelling discharge) found.  Musculoskeletal: Normal range of motion.  Neurological: She is alert and oriented to person, place, and time. No cranial nerve deficit.  Skin: Skin is warm and dry.  Psychiatric: Her behavior is  normal. Judgment and thought content normal.          Assessment & Plan:  1. Vaginal Discharge: Pt reported a foul smelling vaginal discharge. Her pelvic examination did reveal thin whitish appearing foul smelling vaginal discharge. Probes sent for GC/Chlamydia and BV.   2.  Menorrhagia: Pt is willing to accept contraception, however, she does not want the depo-provera. She is requesting Cyclessa which she has taken in the past and did well it. Will obtain pregnancy test and if negative prescribe Cyclessa.  3. Immunizations: Pt has agreed to receive Gardisil. Will initiate immunizations.  4. Hb SS: Pt has not been consistent with taking her Hydrea and Folic acid in the last month but has been mostly taking everyday. Pain well controlled on current medications.  5. STI's: Pt has had unprotected sexual activity in the last month. Discussed risk of pregnancy and STI's. Will obtain HIV test. Pt will also start Gardisil immunizations.  6. Tobacco Use Disorder: Pt is in contemplative state. She does not want to start Chantix and feels that she can quit on her own.    7. Depression: Pt has a score of 8 which places her in the moderately depressed range. I have spoken to patient with increasing her antidepressant. However does not want to increase it at this time. I will re-evaluate her on the next visit. It her PHQ-9 score is unchanged then will increase dose.

## 2013-06-20 LAB — BACTERIAL VAGINOSIS WITH REFLEX

## 2013-06-20 LAB — GC/CHLAMYDIA PROBE AMP
CT PROBE, AMP APTIMA: NEGATIVE
GC PROBE AMP APTIMA: NEGATIVE

## 2013-06-21 LAB — FERRITIN: Ferritin: 506 ng/mL — ABNORMAL HIGH (ref 10–291)

## 2013-06-28 ENCOUNTER — Inpatient Hospital Stay (HOSPITAL_COMMUNITY)
Admission: AD | Admit: 2013-06-28 | Discharge: 2013-07-05 | DRG: 812 | Disposition: A | Discharge status: Home / Self Care | Payer: Medicare Other | Attending: Family Medicine | Admitting: Family Medicine

## 2013-06-28 ENCOUNTER — Telehealth (HOSPITAL_COMMUNITY): Payer: Self-pay | Admitting: Internal Medicine

## 2013-06-28 ENCOUNTER — Encounter (HOSPITAL_COMMUNITY): Payer: Self-pay | Admitting: Hematology

## 2013-06-28 ENCOUNTER — Telehealth: Payer: Self-pay | Admitting: Family Medicine

## 2013-06-28 DIAGNOSIS — M549 Dorsalgia, unspecified: Secondary | ICD-10-CM | POA: Diagnosis not present

## 2013-06-28 DIAGNOSIS — R4586 Emotional lability: Secondary | ICD-10-CM

## 2013-06-28 DIAGNOSIS — F329 Major depressive disorder, single episode, unspecified: Secondary | ICD-10-CM | POA: Diagnosis not present

## 2013-06-28 DIAGNOSIS — F3289 Other specified depressive episodes: Secondary | ICD-10-CM | POA: Diagnosis not present

## 2013-06-28 DIAGNOSIS — D72829 Elevated white blood cell count, unspecified: Secondary | ICD-10-CM | POA: Diagnosis not present

## 2013-06-28 DIAGNOSIS — F411 Generalized anxiety disorder: Secondary | ICD-10-CM | POA: Diagnosis present

## 2013-06-28 DIAGNOSIS — E43 Unspecified severe protein-calorie malnutrition: Secondary | ICD-10-CM

## 2013-06-28 DIAGNOSIS — E663 Overweight: Secondary | ICD-10-CM

## 2013-06-28 DIAGNOSIS — D571 Sickle-cell disease without crisis: Secondary | ICD-10-CM | POA: Diagnosis not present

## 2013-06-28 DIAGNOSIS — F32A Depression, unspecified: Secondary | ICD-10-CM

## 2013-06-28 DIAGNOSIS — K219 Gastro-esophageal reflux disease without esophagitis: Secondary | ICD-10-CM

## 2013-06-28 DIAGNOSIS — R059 Cough, unspecified: Secondary | ICD-10-CM | POA: Diagnosis present

## 2013-06-28 DIAGNOSIS — M87029 Idiopathic aseptic necrosis of unspecified humerus: Secondary | ICD-10-CM

## 2013-06-28 DIAGNOSIS — R112 Nausea with vomiting, unspecified: Secondary | ICD-10-CM

## 2013-06-28 DIAGNOSIS — Z7251 High risk heterosexual behavior: Secondary | ICD-10-CM

## 2013-06-28 DIAGNOSIS — R05 Cough: Secondary | ICD-10-CM | POA: Diagnosis present

## 2013-06-28 DIAGNOSIS — E875 Hyperkalemia: Secondary | ICD-10-CM | POA: Diagnosis not present

## 2013-06-28 DIAGNOSIS — J020 Streptococcal pharyngitis: Secondary | ICD-10-CM

## 2013-06-28 DIAGNOSIS — N898 Other specified noninflammatory disorders of vagina: Secondary | ICD-10-CM

## 2013-06-28 DIAGNOSIS — G43909 Migraine, unspecified, not intractable, without status migrainosus: Secondary | ICD-10-CM

## 2013-06-28 DIAGNOSIS — K5900 Constipation, unspecified: Secondary | ICD-10-CM

## 2013-06-28 DIAGNOSIS — F6389 Other impulse disorders: Secondary | ICD-10-CM

## 2013-06-28 DIAGNOSIS — M79651 Pain in right thigh: Secondary | ICD-10-CM

## 2013-06-28 DIAGNOSIS — R51 Headache: Secondary | ICD-10-CM

## 2013-06-28 DIAGNOSIS — D649 Anemia, unspecified: Secondary | ICD-10-CM | POA: Diagnosis not present

## 2013-06-28 DIAGNOSIS — R509 Fever, unspecified: Secondary | ICD-10-CM

## 2013-06-28 DIAGNOSIS — G8929 Other chronic pain: Secondary | ICD-10-CM

## 2013-06-28 DIAGNOSIS — R000 Tachycardia, unspecified: Secondary | ICD-10-CM

## 2013-06-28 DIAGNOSIS — D5700 Hb-SS disease with crisis, unspecified: Principal | ICD-10-CM

## 2013-06-28 DIAGNOSIS — F439 Reaction to severe stress, unspecified: Secondary | ICD-10-CM

## 2013-06-28 DIAGNOSIS — M79606 Pain in leg, unspecified: Secondary | ICD-10-CM

## 2013-06-28 DIAGNOSIS — Z30011 Encounter for initial prescription of contraceptive pills: Secondary | ICD-10-CM

## 2013-06-28 DIAGNOSIS — Z23 Encounter for immunization: Secondary | ICD-10-CM

## 2013-06-28 LAB — COMPREHENSIVE METABOLIC PANEL
ALT: 10 U/L (ref 0–35)
AST: 17 U/L (ref 0–37)
Albumin: 4.1 g/dL (ref 3.5–5.2)
Alkaline Phosphatase: 55 U/L (ref 39–117)
BILIRUBIN TOTAL: 2 mg/dL — AB (ref 0.3–1.2)
BUN: 4 mg/dL — AB (ref 6–23)
CALCIUM: 9.2 mg/dL (ref 8.4–10.5)
CO2: 22 meq/L (ref 19–32)
Chloride: 104 mEq/L (ref 96–112)
Creatinine, Ser: 0.55 mg/dL (ref 0.50–1.10)
GFR calc non Af Amer: >90 mL/min (ref >90)
Glucose, Bld: 97 mg/dL (ref 70–99)
Potassium: 3.9 mEq/L (ref 3.7–5.3)
Sodium: 139 mEq/L (ref 137–147)
Total Protein: 7.4 g/dL (ref 6.0–8.3)

## 2013-06-28 LAB — CBC WITH DIFFERENTIAL/PLATELET
BASOS ABS: 0.1 10*3/uL (ref 0.0–0.1)
Basophils Relative: 1 % (ref 0–1)
Eosinophils Absolute: 0.3 10*3/uL (ref 0.0–0.7)
Eosinophils Relative: 2 % (ref 0–5)
HCT: 26.4 % — ABNORMAL LOW (ref 36.0–46.0)
Hemoglobin: 9.3 g/dL — ABNORMAL LOW (ref 12.0–15.0)
LYMPHS PCT: 28 % (ref 12–46)
Lymphs Abs: 3.6 10*3/uL (ref 0.7–4.0)
MCH: 32.1 pg (ref 26.0–34.0)
MCHC: 35.2 g/dL (ref 30.0–36.0)
MCV: 91 fL (ref 78.0–100.0)
MONO ABS: 1 10*3/uL (ref 0.1–1.0)
Monocytes Relative: 8 % (ref 3–12)
NEUTROS PCT: 61 % (ref 43–77)
Neutro Abs: 7.9 10*3/uL — ABNORMAL HIGH (ref 1.7–7.7)
Platelets: 411 10*3/uL — ABNORMAL HIGH (ref 150–400)
RBC: 2.9 MIL/uL — ABNORMAL LOW (ref 3.87–5.11)
RDW: 15.6 % — ABNORMAL HIGH (ref 11.5–15.5)
WBC: 12.9 10*3/uL — ABNORMAL HIGH (ref 4.0–10.5)

## 2013-06-28 MED ORDER — SODIUM CHLORIDE 0.9 % IJ SOLN: 9.0000 mL | INTRAMUSCULAR | Status: DC | PRN

## 2013-06-28 MED ORDER — PRENATAL MULTIVITAMIN CH
1.0000 | ORAL_TABLET | Freq: Every day | ORAL | Status: DC
  Administered 2013-06-28 – 2013-06-30 (×3): 1 via ORAL
  Filled 2013-06-28 (×6): qty 1

## 2013-06-28 MED ORDER — KETOROLAC TROMETHAMINE 30 MG/ML IJ SOLN: 30.0000 mg | Freq: Once | INTRAMUSCULAR | Status: DC

## 2013-06-28 MED ORDER — METHADONE HCL 5 MG PO TABS
5.0000 mg | ORAL_TABLET | Freq: Every day | ORAL | Status: DC
  Administered 2013-06-28 – 2013-07-04 (×6): 5 mg via ORAL
  Filled 2013-06-28 (×7): qty 1

## 2013-06-28 MED ORDER — FOLIC ACID 1 MG PO TABS
1.5000 mg | ORAL_TABLET | Freq: Every day | ORAL | Status: DC
  Administered 2013-06-28 – 2013-07-04 (×6): 1.5 mg via ORAL
  Filled 2013-06-28 (×8): qty 1

## 2013-06-28 MED ORDER — HYDROMORPHONE HCL PF 2 MG/ML IJ SOLN
3.2000 mg | Freq: Once | INTRAMUSCULAR | Status: AC
  Administered 2013-06-28: 3.2 mg via INTRAVENOUS
  Filled 2013-06-28: qty 2

## 2013-06-28 MED ORDER — DIPHENHYDRAMINE HCL 12.5 MG/5ML PO ELIX: 12.5000 mg | ORAL_SOLUTION | Freq: Four times a day (QID) | ORAL | Status: DC | PRN

## 2013-06-28 MED ORDER — HYDROXYUREA 500 MG PO CAPS
1000.0000 mg | ORAL_CAPSULE | Freq: Every day | ORAL | Status: DC
  Administered 2013-06-28 – 2013-07-04 (×7): 1000 mg via ORAL
  Filled 2013-06-28 (×9): qty 2

## 2013-06-28 MED ORDER — SERTRALINE HCL 50 MG PO TABS
50.0000 mg | ORAL_TABLET | Freq: Every day | ORAL | Status: DC
  Administered 2013-06-28: 50 mg via ORAL
  Filled 2013-06-28 (×8): qty 1

## 2013-06-28 MED ORDER — DIPHENHYDRAMINE HCL 50 MG/ML IJ SOLN
12.5000 mg | Freq: Four times a day (QID) | INTRAMUSCULAR | Status: DC | PRN
  Administered 2013-06-28: 12.5 mg via INTRAVENOUS
  Filled 2013-06-28: qty 1

## 2013-06-28 MED ORDER — FOLIC ACID 800 MCG PO TABS: 1600.0000 ug | ORAL_TABLET | Freq: Every morning | ORAL | Status: DC

## 2013-06-28 MED ORDER — NALOXONE HCL 0.4 MG/ML IJ SOLN: 0.4000 mg | INTRAMUSCULAR | Status: DC | PRN

## 2013-06-28 MED ORDER — KETOROLAC TROMETHAMINE 30 MG/ML IJ SOLN
30.0000 mg | Freq: Four times a day (QID) | INTRAMUSCULAR | Status: DC
  Administered 2013-06-29 – 2013-06-30 (×5): 30 mg via INTRAVENOUS
  Filled 2013-06-28 (×10): qty 1

## 2013-06-28 MED ORDER — HYDROMORPHONE 0.3 MG/ML IV SOLN
INTRAVENOUS | Status: DC
  Administered 2013-06-28: 1.6 mg via INTRAVENOUS
  Administered 2013-06-28: 1.59 mg via INTRAVENOUS
  Administered 2013-06-28 (×2): via INTRAVENOUS
  Administered 2013-06-29: 7.61 mg via INTRAVENOUS
  Administered 2013-06-29: 08:00:00 via INTRAVENOUS
  Administered 2013-06-29: 1.7 mg via INTRAVENOUS
  Administered 2013-06-29: 0.3 mg via INTRAVENOUS
  Administered 2013-06-29 (×2): via INTRAVENOUS
  Administered 2013-06-29: 5.6 mg via INTRAVENOUS
  Administered 2013-06-30: 3.19 mg via INTRAVENOUS
  Administered 2013-06-30: 0.3 mg via INTRAVENOUS
  Administered 2013-06-30: 06:00:00 via INTRAVENOUS
  Administered 2013-06-30: 5.9 mg via INTRAVENOUS
  Administered 2013-06-30 (×2): 25 mL via INTRAVENOUS
  Administered 2013-07-01: 0.3 mg via INTRAVENOUS
  Administered 2013-07-01: 3.99 mg via INTRAVENOUS
  Administered 2013-07-01: 15.39 mg via INTRAVENOUS
  Administered 2013-07-01: 25 mg via INTRAVENOUS
  Administered 2013-07-01: 10.45 mg via INTRAVENOUS
  Administered 2013-07-01: 25 mL via INTRAVENOUS
  Administered 2013-07-01: 8.21 mg via INTRAVENOUS
  Administered 2013-07-01: 0.3 mg via INTRAVENOUS
  Administered 2013-07-02: 2.71 mg via INTRAVENOUS
  Administered 2013-07-02: 0.3 mg via INTRAVENOUS
  Administered 2013-07-02: 4.79 mg via INTRAVENOUS
  Filled 2013-06-28 (×18): qty 25

## 2013-06-28 MED ORDER — KETOROLAC TROMETHAMINE 30 MG/ML IJ SOLN
30.0000 mg | Freq: Once | INTRAMUSCULAR | Status: AC
  Administered 2013-06-28: 30 mg via INTRAVENOUS
  Filled 2013-06-28: qty 1

## 2013-06-28 MED ORDER — ONDANSETRON HCL 4 MG PO TABS
4.0000 mg | ORAL_TABLET | Freq: Once | ORAL | Status: AC
  Administered 2013-06-28: 4 mg via ORAL
  Filled 2013-06-28: qty 1

## 2013-06-28 MED ORDER — HYDROMORPHONE HCL PF 2 MG/ML IJ SOLN
2.6000 mg | Freq: Once | INTRAMUSCULAR | Status: AC
  Administered 2013-06-28: 2.6 mg via INTRAVENOUS
  Filled 2013-06-28: qty 2

## 2013-06-28 MED ORDER — ONDANSETRON HCL 4 MG/2ML IJ SOLN
4.0000 mg | Freq: Four times a day (QID) | INTRAMUSCULAR | Status: DC | PRN
  Administered 2013-07-04 – 2013-07-05 (×3): 4 mg via INTRAVENOUS
  Filled 2013-06-28 (×3): qty 2

## 2013-06-28 MED ORDER — DEXTROSE-NACL 5-0.45 % IV SOLN
INTRAVENOUS | Status: DC
  Administered 2013-06-28 – 2013-06-29 (×3): via INTRAVENOUS
  Administered 2013-07-01: 1000 mL via INTRAVENOUS
  Administered 2013-07-02: 07:00:00 via INTRAVENOUS

## 2013-06-28 NOTE — H&P (Signed)
[facility] History and Physical   Date: 06/28/2013  Patient name: [name] Medical record number: [id number] Date of birth: January 09, 1991 Age: 23 y.o. Gender: female PCP: MATTHEWS,MICHELLE A., MD  Attending physician: Elwyn Reach, MD  Chief Complaint: Acute Pain  History of Present Illness: Patient is a 23 year old female that [facility] with a 3 day history of pain. Patient states that pain was 4-5/10 upon awakening this am, but has currently increased to 7/10. Patient states that she took prescribed dose of Oxycodone 10 mg on 06/28/2013 at 10 am, with minimal relief. Patient states that she has not had much relief in 3 days on current regimen. Current pain is localized to wrist and ankles bilaterally, and is localized to right groin.  Patient states that she increased hydration at home because she was trying to prevent a hospital admission.   Meds: Prescriptions prior to admission  Medication Sig Dispense Refill  . folic acid (FOLVITE) 267 MCG tablet Take 2 tablets (1,600 mcg total) by mouth every morning.      . hydroxyurea (HYDREA) 500 MG capsule Take 2 capsules (1,000 mg total) by mouth at bedtime. May take with food to minimize GI side effects.  60 capsule  3  . methadone (DOLOPHINE) 5 MG tablet Take 1 tablet (5 mg total) by mouth at bedtime.  30 tablet  0  . ondansetron (ZOFRAN) 4 MG tablet Take 4 mg by mouth every 6 (six) hours as needed for nausea or vomiting.      . ondansetron (ZOFRAN) 8 MG tablet Take 8 mg by mouth every 4 (four) hours as needed.      . Oxycodone HCl 10 MG TABS Take 1.5 tablets (15 mg total) by mouth 3 (three) times daily as needed.  90 tablet  0  . polyethylene glycol (MIRALAX / GLYCOLAX) packet Take 17 g by mouth daily as needed (for constipation).      . Prenatal Vit-Fe Fumarate-FA (PRENATAL MULTIVITAMIN) TABS tablet Take 1 tablet by mouth daily at 12 noon.      . sertraline (ZOLOFT) 50 MG tablet Take 1  tablet (50 mg total) by mouth daily.  30 tablet  3    Allergies: Carrot oil and Latex Past Medical History  Diagnosis Date  . H/O: 1 miscarriage 03/22/2011  . 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 Depression 01/06/2011  . GERD (gastroesophageal reflux disease) 02/17/2011  . Trichotillomania     h/o  . Blood transfusion     "lots"  . Sickle cell anemia with crisis   . Exertional dyspnea     "sometimes"  . Sickle cell anemia   . Headache(784.0)   . Migraines 11/08/11    "@ least twice/month"  . Chronic back pain     "very severe; have knot in my back; from tight muscle; take RX and exercise for it"  . Mood swings 11/08/11    "I go back and forth; real bad"  . Pregnancy   . Blood dyscrasia     SICKLE CELL   Past Surgical History  Procedure Laterality Date  . Cholecystectomy  05/2010  . Dilation and curettage of uterus  02/20/11    S/P miscarriage   Family History  Problem Relation Age of Onset  . Diabetes Mother   . Alcoholism    . Depression    . Hypercholesterolemia    . Hypertension    . Migraines    . Diabetes Maternal Grandmother   .  Diabetes Paternal Grandmother    History   Social History  . Marital Status: Single    Spouse Name: N/A    Number of Children: N/A  . Years of Education: N/A   Occupational History  . Not on file.   Social History Main Topics  . Smoking status: Current Some Day Smoker -- 0.25 packs/day for 1 years    Types: Cigarettes    Last Attempt to Quit: 03/25/2013  . Smokeless tobacco: Never Used  . Alcohol Use: Yes     Comment: pt states she quit marijuan in May 2013. Rare ETOH, + cigarettes.  She is enrolled in school  . Drug Use: No  . Sexual Activity: Not Currently    Birth Control/ Protection: None   Other Topics Concern  . 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  HEENT: No headaches, dizziness, seizures, vision changes, difficulty swallowing,Tooth/dental problems,Sore throat, No sneezing, itching, ear ache, nasal congestion, post nasal drip,  Cardio-vascular: No chest pain, Orthopnea, PND, swelling in lower extremities, anasarca, dizziness, palpitations  GI: No heartburn, indigestion, abdominal pain, nausea, vomiting, diarrhea, change in bowel habits, loss of appetite  Resp: No shortness of breath with exertion or at rest. No excess mucus, no productive cough, No non-productive cough, No coughing up of blood.No change in color of mucus.No wheezing.No chest wall deformity  Skin: no rash or lesions.  GU: no dysuria, change in color of urine, no urgency or frequency. No flank pain.  Musc: Localized pain to upper and lower extremities. No back pain.       Physical Exam: Last menstrual period 06/08/2013. BP 106/62  Pulse 88  Temp(Src) 98.7 F (37.1 C) (Oral)  Resp 20  Ht 5\' 2"  (1.575 m)  Wt 182 lb (82.555 kg)  BMI 33.28 kg/m2  SpO2 100%  LMP 06/08/2013  General Appearance:    Alert, cooperative, no distress, appears stated age  Head:    Normocephalic, without obvious abnormality, atraumatic  Eyes:    PERRL, conjunctiva/corneas clear, EOM's intact, fundi    benign, both eyes  Ears:    Normal TM's and external ear canals, both ears  Nose:   Nares normal, septum midline, mucosa normal, no drainage    or sinus tenderness  Throat:   Lips, mucosa, and tongue normal; teeth and gums normal, left tonsil 2+  Neck:   Supple, symmetrical, trachea midline, no adenopathy;    thyroid:  no enlargement/tenderness/nodules; no carotid   bruit or JVD  Back:     Symmetric, no curvature, ROM normal, no CVA tenderness  Lungs:     Clear to auscultation bilaterally, respirations unlabored  Chest Wall:    No tenderness or deformity   Heart:    Regular rate and rhythm, S1 and S2 normal, no murmur, rub   or gallop     Abdomen:      Soft, non-tender, bowel sounds active all four quadrants,    no masses, no organomegaly  Extremities:   Extremities normal, atraumatic, no cyanosis or edema, mild tenderness to wrists along radius and lateral ankle bilaterally  Pulses:   2+ and symmetric all extremities  Skin:   Skin color, texture, turgor normal, no rashes or lesions  Lymph nodes:   Cervical, supraclavicular, and axillary nodes  normal  Neurologic:   CNII-XII intact, normal strength, sensation and reflexes    throughout    Lab results: No results found for this or any previous visit (from the past 24 hour(s)).  Imaging results:  No results found.   Assessment & Plan: 1. Acute pain: Extended observation.  Administer weight based IV analgesics. Adjunct therapy IV Toradol and heat. D5 1/2 at 100 for via IV and Toradol IV.  Labs: CBC with differential and complete metabolic panel   Nekisha Mcdiarmid M 06/28/2013, 2:25 PM

## 2013-06-28 NOTE — Progress Notes (Signed)
Called patient in room 1513 and advised that she left her ear phones at the [facility].  I explained that I would send them through the tube station, so for her to check at the nurses station for them.  Patient says ok, then hangs up the phone.

## 2013-06-28 NOTE — Progress Notes (Signed)
   Subjective:    Patient ID: [name], female    DOB: Mar 07, 1991, 23 y.o.   MRN: [id number]  Sickle Cell Pain Crisis This is a chronic problem. The current episode started in the past 7 days. The problem occurs constantly. The problem has been gradually worsening. Associated symptoms include myalgias. Pertinent negatives include no abdominal pain, chest pain, fatigue, fever, headaches, joint swelling, nausea, swollen glands or vomiting. The symptoms are aggravated by exertion, standing and walking. She has tried oral narcotics, NSAIDs and heat for the symptoms. The treatment provided no relief.      Review of Systems  Constitutional: Negative for fever and fatigue.  Eyes: Negative.   Cardiovascular: Negative for chest pain, palpitations and leg swelling.  Gastrointestinal: Negative for nausea, vomiting and abdominal pain.  Endocrine: Negative.   Genitourinary: Negative.   Musculoskeletal: Positive for myalgias. Negative for joint swelling.  Allergic/Immunologic: Negative.   Neurological: Negative.  Negative for headaches.  Psychiatric/Behavioral: The patient is nervous/anxious.        Objective:   Physical Exam  Constitutional: She appears well-developed and well-nourished.  HENT:  Head: Normocephalic and atraumatic.  Eyes: Pupils are equal, round, and reactive to light.  Neck: Normal range of motion.  Pulmonary/Chest: No respiratory distress. She has no rales. She exhibits no tenderness.  Abdominal: Soft. Bowel sounds are normal.  Musculoskeletal: She exhibits tenderness.       Right wrist: She exhibits decreased range of motion and tenderness.       Left wrist: She exhibits decreased range of motion and tenderness.       Right ankle: She exhibits decreased range of motion. Tenderness.       Left ankle: She exhibits decreased range of motion. Tenderness.  Skin: Skin is warm and dry.          Assessment & Plan:  1. Sickle cell anemia with pain: Pt was treated  with IVF, Toradol and IV analgesics. Pt was initially treated with weight based rapid re-dosing and then transitioned to a weight based PCA Dilaudid. She has total use of 1.6 mg with 2 demands and 2 deliveries. Pain has increased  from 5/10 to 7/10. Pt states that she cannot manage pain with prescribed medications and home interventions. Pt will transferred to inpatient unit.

## 2013-06-28 NOTE — Progress Notes (Signed)
Report given to Tom on 5E, patient's belongings packed, and patient transported to the room via wheelchair.

## 2013-06-28 NOTE — Telephone Encounter (Signed)
Patient telephone Camilla stating that she has had 5/10 generalized pain described as "tingling" for 2-3 days. Patient states that her typical daily pain level is 2/10. Reports that she has been taking medication as prescribed. Patient states that she has some events taking place and would rather not come into the hospital. Recommend that the patient increase hydration, take Ibuprofen 800, use heating pad,  and take an additional dose of prescribed Oxycodone 10 mg pain medication. Patient states that she will attempt to manage at home. Advised to notify day hospital as needed.

## 2013-06-28 NOTE — Progress Notes (Signed)
Attempted to call report to [facility] on the floor.  Nurse is unavailable due to shift change.  This RN is awaiting a call back

## 2013-06-28 NOTE — Telephone Encounter (Signed)
Pt states that she is experiencing pain in wrist and ankles; states that she is continuing to function but feels like a pain crisis is coming on; pt states that she does not want to come to the clinic right now but would like the MD to know about her symptoms; told pt that I would notify MD; pt verbalizes understanding

## 2013-06-29 DIAGNOSIS — D5700 Hb-SS disease with crisis, unspecified: Secondary | ICD-10-CM | POA: Diagnosis not present

## 2013-06-29 LAB — CBC
HCT: 24.6 % — ABNORMAL LOW (ref 36.0–46.0)
Hemoglobin: 8.8 g/dL — ABNORMAL LOW (ref 12.0–15.0)
MCH: 31.8 pg (ref 26.0–34.0)
MCHC: 35.8 g/dL (ref 30.0–36.0)
MCV: 88.8 fL (ref 78.0–100.0)
Platelets: 467 10*3/uL — ABNORMAL HIGH (ref 150–400)
RBC: 2.77 MIL/uL — ABNORMAL LOW (ref 3.87–5.11)
RDW: 15.9 % — AB (ref 11.5–15.5)
WBC: 14.1 10*3/uL — ABNORMAL HIGH (ref 4.0–10.5)

## 2013-06-29 LAB — CREATININE, SERUM
CREATININE: 0.58 mg/dL (ref 0.50–1.10)
GFR calc Af Amer: >90 mL/min (ref >90)

## 2013-06-29 MED ORDER — ENOXAPARIN SODIUM 40 MG/0.4ML ~~LOC~~ SOLN
40.0000 mg | SUBCUTANEOUS | Status: DC
Start: 2013-06-29 — End: 2013-07-05
  Administered 2013-06-29 – 2013-07-05 (×7): 40 mg via SUBCUTANEOUS
  Filled 2013-06-29 (×7): qty 0.4

## 2013-06-29 NOTE — Progress Notes (Signed)
Subjective: A 23 yo admitted with sickle cell painful crisis. She was initially at the day hospital but was transferred to inpatient due to worsening pain. She is on Dilaudid PCA and has used 29.8mg  since admission. She still has 8/10 pain in her lower back and limbs. No fever, no NVD. Has been having a lot of emotional problems at home and that is worsening her sickle cell crisis.  Objective: Vital signs in last 24 hours: Temp:  [97.9 F (36.6 C)-98.7 F (37.1 C)] 97.9 F (36.6 C) (02/14 0616) Pulse Rate:  [88-117] 103 (02/14 0616) Resp:  [13-20] 18 (02/14 0616) BP: (89-123)/(52-82) 89/53 mmHg (02/14 0616) SpO2:  [95 %-100 %] 96 % (02/14 0616) Weight:  [79.198 kg (174 lb 9.6 oz)-82.555 kg (182 lb)] 79.198 kg (174 lb 9.6 oz) (02/13 1951) Weight change:  Last BM Date: 06/27/13  Intake/Output from previous day: 02/13 0701 - 02/14 0700 In: 1285 [I.V.:1285] Out: -  Intake/Output this shift:    General appearance: alert, cooperative and no distress Eyes: conjunctivae/corneas clear. PERRL, EOM's intact. Fundi benign. Neck: no adenopathy, no carotid bruit, no JVD, supple, symmetrical, trachea midline and thyroid not enlarged, symmetric, no tenderness/mass/nodules Back: symmetric, no curvature. ROM normal. No CVA tenderness. Resp: clear to auscultation bilaterally Chest wall: no tenderness Cardio: regular rate and rhythm, S1, S2 normal, no murmur, click, rub or gallop GI: soft, non-tender; bowel sounds normal; no masses,  no organomegaly Extremities: extremities normal, atraumatic, no cyanosis or edema Pulses: 2+ and symmetric Skin: Skin color, texture, turgor normal. No rashes or lesions Neurologic: Grossly normal  Lab Results:  Recent Labs  06/28/13 1620  WBC 12.9*  HGB 9.3*  HCT 26.4*  PLT 411*   BMET  Recent Labs  06/28/13 1620  NA 139  K 3.9  CL 104  CO2 22  GLUCOSE 97  BUN 4*  CREATININE 0.55  CALCIUM 9.2    Studies/Results: No results  found.  Medications: I have reviewed the patient's current medications.  Assessment/Plan: A 23 yo admitted with sickle Cell painful crisis.  #1 Sickle Cell Painful Crisis: Patient is having some control with her current dose PCA. Will keep on current regimen for another day.   #2 Sickle Cell Anemia: H/H is close to baseline. No evidence of hemolytic crisis. Will monitor.  #3 Depression/Anxiety: Counseled. #4 Tobacco abuse: Counseled.   LOS: 1 day   Lj Miyamoto,LAWAL 06/29/2013, 11:52 AM

## 2013-06-30 DIAGNOSIS — D5700 Hb-SS disease with crisis, unspecified: Secondary | ICD-10-CM | POA: Diagnosis not present

## 2013-06-30 MED ORDER — KETOROLAC TROMETHAMINE 15 MG/ML IJ SOLN
30.0000 mg | Freq: Four times a day (QID) | INTRAMUSCULAR | Status: AC
  Administered 2013-06-30 – 2013-07-03 (×12): 30 mg via INTRAVENOUS
  Filled 2013-06-30 (×19): qty 2

## 2013-06-30 MED ORDER — HYDROMORPHONE HCL PF 1 MG/ML IJ SOLN
0.5000 mg | Freq: Once | INTRAMUSCULAR | Status: AC
  Administered 2013-06-30: 0.5 mg via INTRAVENOUS
  Filled 2013-06-30: qty 1

## 2013-06-30 NOTE — H&P (Signed)
Patient was seen and examined. Agree with notes above by NP.

## 2013-06-30 NOTE — Progress Notes (Signed)
Subjective:  Patient is having 6/10 pain today. Much improved but has not been moving around. Wants to be able to function when she gets home. No fever, no NVD. Using her Dilaudid PCA as scheduled.   Objective: Vital signs in last 24 hours: Temp:  [97.3 F (36.3 C)-99.7 F (37.6 C)] 97.8 F (36.6 C) (02/15 0416) Pulse Rate:  [96-106] 96 (02/15 0416) Resp:  [16-20] 18 (02/15 0606) BP: (94-111)/(57-74) 95/64 mmHg (02/15 0416) SpO2:  [93 %-100 %] 99 % (02/15 0606) FiO2 (%):  [97 %-98 %] 97 % (02/14 1753) Weight change:  Last BM Date: 06/27/13  Intake/Output from previous day: 02/14 0701 - 02/15 0700 In: 2142 [P.O.:720; I.V.:1422] Out: -  Intake/Output this shift:    General appearance: alert, cooperative and no distress Eyes: conjunctivae/corneas clear. PERRL, EOM's intact. Fundi benign. Neck: no adenopathy, no carotid bruit, no JVD, supple, symmetrical, trachea midline and thyroid not enlarged, symmetric, no tenderness/mass/nodules Back: symmetric, no curvature. ROM normal. No CVA tenderness. Resp: clear to auscultation bilaterally Chest wall: no tenderness Cardio: regular rate and rhythm, S1, S2 normal, no murmur, click, rub or gallop GI: soft, non-tender; bowel sounds normal; no masses,  no organomegaly Extremities: extremities normal, atraumatic, no cyanosis or edema Pulses: 2+ and symmetric Skin: Skin color, texture, turgor normal. No rashes or lesions Neurologic: Grossly normal  Lab Results:  Recent Labs  06/28/13 1620 06/29/13 1248  WBC 12.9* 14.1*  HGB 9.3* 8.8*  HCT 26.4* 24.6*  PLT 411* 467*   BMET  Recent Labs  06/28/13 1620 06/29/13 1248  NA 139  --   K 3.9  --   CL 104  --   CO2 22  --   GLUCOSE 97  --   BUN 4*  --   CREATININE 0.55 0.58  CALCIUM 9.2  --     Studies/Results: No results found.  Medications: I have reviewed the patient's current medications.  Assessment/Plan: A 23 yo admitted with sickle Cell painful crisis.  #1 Sickle  Cell Painful Crisis:Patient has improved. Will decrease the dose of her Dilaudid PCA and move her around on the floor. Will plan for DC in am.  #2 Sickle Cell Anemia: H/H has dropped now. Will continue to monitor.  #3 Depression/Anxiety: Counseled. Continue home medications.  #4 Tobacco abuse: Counseled.   LOS: 2 days   Neely Cecena,LAWAL 06/30/2013, 12:08 PM

## 2013-07-01 ENCOUNTER — Telehealth: Payer: Self-pay | Admitting: Internal Medicine

## 2013-07-01 DIAGNOSIS — D5700 Hb-SS disease with crisis, unspecified: Secondary | ICD-10-CM | POA: Diagnosis not present

## 2013-07-01 DIAGNOSIS — D571 Sickle-cell disease without crisis: Secondary | ICD-10-CM

## 2013-07-01 LAB — CBC WITH DIFFERENTIAL/PLATELET
Basophils Absolute: 0.2 10*3/uL — ABNORMAL HIGH (ref 0.0–0.1)
Basophils Relative: 1 % (ref 0–1)
Eosinophils Absolute: 0.7 10*3/uL (ref 0.0–0.7)
Eosinophils Relative: 4 % (ref 0–5)
HEMATOCRIT: 24.7 % — AB (ref 36.0–46.0)
Hemoglobin: 8.9 g/dL — ABNORMAL LOW (ref 12.0–15.0)
Lymphocytes Relative: 30 % (ref 12–46)
Lymphs Abs: 5 10*3/uL — ABNORMAL HIGH (ref 0.7–4.0)
MCH: 32.4 pg (ref 26.0–34.0)
MCHC: 36 g/dL (ref 30.0–36.0)
MCV: 89.8 fL (ref 78.0–100.0)
MONO ABS: 1.8 10*3/uL — AB (ref 0.1–1.0)
MONOS PCT: 11 % (ref 3–12)
NEUTROS ABS: 8.9 10*3/uL — AB (ref 1.7–7.7)
Neutrophils Relative %: 54 % (ref 43–77)
Platelets: 532 10*3/uL — ABNORMAL HIGH (ref 150–400)
RBC: 2.75 MIL/uL — AB (ref 3.87–5.11)
RDW: 17.4 % — ABNORMAL HIGH (ref 11.5–15.5)
WBC: 16.6 10*3/uL — ABNORMAL HIGH (ref 4.0–10.5)

## 2013-07-01 NOTE — Telephone Encounter (Signed)
Refill request for Oxycodone HCl 10 MG TABS

## 2013-07-01 NOTE — Progress Notes (Signed)
I found [name]  IV TURNED OFF twice today, at 1025 and 1300. Pt states she didn't touch it. I turned the IV around facing away from her and told her not to turn it. I also locked the pump. The IV was never turned off again.

## 2013-07-01 NOTE — Progress Notes (Signed)
This shift pt calls RN to room regarding leakage from tubing. Discovered that PCA tubing is kinked and cut with large portion of med leaked on floor. Syringe was just replaced at [place] pm by Mardi Mainland RN. New tubing and PCA set up was initiated by Pandora, RN and myself. Tubing damage witnessed by Vladimir Creeks and Vanita Ingles, RN.

## 2013-07-01 NOTE — Progress Notes (Signed)
Subjective:  Patient is having 7/10 pain today. She was unable to get pain medicine last night due to a breakdown in the PCA. She wants to get some pushes now for breakthrough pain. Denies Nausea or vomiting. Had swollen knee so was unable to walk yesterday. She denies any other complaint. Has used 36.76mg  so far of her Dilaudid in last 24 hours. Also having cough productive of sputum.   Objective: Vital signs in last 24 hours: Temp:  [97.9 F (36.6 C)-98.7 F (37.1 C)] 98 F (36.7 C) (02/16 0958) Pulse Rate:  [67-97] 67 (02/16 0958) Resp:  [12-16] 14 (02/16 0958) BP: (92-116)/(62-78) 106/78 mmHg (02/16 0958) SpO2:  [98 %-100 %] 99 % (02/16 0958) Weight change:  Last BM Date: 06/28/13  Intake/Output from previous day: 02/15 0701 - 02/16 0700 In: 3055.3 [P.O.:717; I.V.:2338.3] Out: -  Intake/Output this shift:    General appearance: alert, cooperative and no distress Eyes: conjunctivae/corneas clear. PERRL, EOM's intact. Fundi benign. Neck: no adenopathy, no carotid bruit, no JVD, supple, symmetrical, trachea midline and thyroid not enlarged, symmetric, no tenderness/mass/nodules Back: symmetric, no curvature. ROM normal. No CVA tenderness. Resp: clear to auscultation bilaterally Chest wall: no tenderness Cardio: regular rate and rhythm, S1, S2 normal, no murmur, click, rub or gallop GI: soft, non-tender; bowel sounds normal; no masses,  no organomegaly Extremities: extremities normal, atraumatic, no cyanosis or edema Pulses: 2+ and symmetric Skin: Skin color, texture, turgor normal. No rashes or lesions Neurologic: Grossly normal  Lab Results:  Recent Labs  06/29/13 1248 07/01/13 0503  WBC 14.1* 16.6*  HGB 8.8* 8.9*  HCT 24.6* 24.7*  PLT 467* 532*   BMET  Recent Labs  06/28/13 1620 06/29/13 1248  NA 139  --   K 3.9  --   CL 104  --   CO2 22  --   GLUCOSE 97  --   BUN 4*  --   CREATININE 0.55 0.58  CALCIUM 9.2  --     Studies/Results: No results  found.  Medications: I have reviewed the patient's current medications.  Assessment/Plan: A 23 yo admitted with sickle Cell painful crisis.  #1 Sickle Cell Painful Crisis: Will maintain current PCA dosing with additional oral medications as per home dose. Also toradol as well as IVF. Patient close to her baseline.  #2 Sickle Cell Anemia: H/H has remained stable.   #3 Cough: worried about Bronchitis or Pneumonia. Will get a chest XRay. If abnormal, will start antibiotics.  #4 Depression/Anxiety: Continue home medications.  #5 Leucocytosis: Worsening. Concerning for infections or VOC.  #6 Tobacco abuse: Counseled.   LOS: 3 days   [name],LAWAL 07/01/2013, 11:22 AM

## 2013-07-01 NOTE — Progress Notes (Signed)
This shift RN noticed that the channel forD5 1/2 NS fluid was shut off x4 occassions. Fluid  should be running at 100 ml/hr  RN questioned pt about maneuvering pump and pt denied any manipulating of pump. Addressed concerns with RN coming on shift Lorri Frederick and encouraged RN to monitor the pump w/ continous fluid.

## 2013-07-02 DIAGNOSIS — D72829 Elevated white blood cell count, unspecified: Secondary | ICD-10-CM | POA: Diagnosis not present

## 2013-07-02 DIAGNOSIS — D5700 Hb-SS disease with crisis, unspecified: Secondary | ICD-10-CM | POA: Diagnosis not present

## 2013-07-02 DIAGNOSIS — E875 Hyperkalemia: Secondary | ICD-10-CM | POA: Diagnosis not present

## 2013-07-02 LAB — CBC WITH DIFFERENTIAL/PLATELET
BASOS PCT: 1 % (ref 0–1)
Basophils Absolute: 0.2 10*3/uL — ABNORMAL HIGH (ref 0.0–0.1)
EOS PCT: 5 % (ref 0–5)
Eosinophils Absolute: 0.9 10*3/uL — ABNORMAL HIGH (ref 0.0–0.7)
HEMATOCRIT: 24 % — AB (ref 36.0–46.0)
HEMOGLOBIN: 8.7 g/dL — AB (ref 12.0–15.0)
LYMPHS ABS: 4.2 10*3/uL — AB (ref 0.7–4.0)
Lymphocytes Relative: 24 % (ref 12–46)
MCH: 32.8 pg (ref 26.0–34.0)
MCHC: 36.3 g/dL — ABNORMAL HIGH (ref 30.0–36.0)
MCV: 90.6 fL (ref 78.0–100.0)
Monocytes Absolute: 1.7 10*3/uL — ABNORMAL HIGH (ref 0.1–1.0)
Monocytes Relative: 10 % (ref 3–12)
NEUTROS ABS: 10.4 10*3/uL — AB (ref 1.7–7.7)
NRBC: 2 /100{WBCs} — AB
Neutrophils Relative %: 60 % (ref 43–77)
Platelets: 441 10*3/uL — ABNORMAL HIGH (ref 150–400)
RBC: 2.65 MIL/uL — AB (ref 3.87–5.11)
RDW: 17.7 % — ABNORMAL HIGH (ref 11.5–15.5)
WBC: 17.4 10*3/uL — ABNORMAL HIGH (ref 4.0–10.5)

## 2013-07-02 LAB — COMPREHENSIVE METABOLIC PANEL
ALBUMIN: 3.8 g/dL (ref 3.5–5.2)
ALK PHOS: 59 U/L (ref 39–117)
ALT: 18 U/L (ref 0–35)
AST: 63 U/L — ABNORMAL HIGH (ref 0–37)
BILIRUBIN TOTAL: 2.2 mg/dL — AB (ref 0.3–1.2)
BUN: 7 mg/dL (ref 6–23)
CHLORIDE: 101 meq/L (ref 96–112)
CO2: 24 meq/L (ref 19–32)
Calcium: 8.5 mg/dL (ref 8.4–10.5)
Creatinine, Ser: 0.6 mg/dL (ref 0.50–1.10)
GFR calc Af Amer: >90 mL/min (ref >90)
GFR calc non Af Amer: >90 mL/min (ref >90)
GLUCOSE: 99 mg/dL (ref 70–99)
POTASSIUM: 5.6 meq/L — AB (ref 3.7–5.3)
Sodium: 136 mEq/L — ABNORMAL LOW (ref 137–147)
Total Protein: 6.9 g/dL (ref 6.0–8.3)

## 2013-07-02 MED ORDER — HYDROMORPHONE 0.3 MG/ML IV SOLN
INTRAVENOUS | Status: DC
  Administered 2013-07-02: 0.6 mg via INTRAVENOUS
  Administered 2013-07-02: 7.31 mg via INTRAVENOUS
  Administered 2013-07-02: 17:00:00 via INTRAVENOUS
  Administered 2013-07-02: 7.31 mg via INTRAVENOUS
  Administered 2013-07-03 (×2): via INTRAVENOUS
  Administered 2013-07-03: 2.99 mg via INTRAVENOUS
  Administered 2013-07-03: 6.6 mg via INTRAVENOUS
  Administered 2013-07-03: 7.59 mg via INTRAVENOUS
  Administered 2013-07-03: 6.62 mg via INTRAVENOUS
  Administered 2013-07-03: 20:00:00 via INTRAVENOUS
  Administered 2013-07-04: 3.6 mg via INTRAVENOUS
  Administered 2013-07-04: 0.4 mg via INTRAVENOUS
  Administered 2013-07-04: 6.5 mg via INTRAVENOUS
  Administered 2013-07-04: 23:00:00 via INTRAVENOUS
  Administered 2013-07-04: 5.99 mg via INTRAVENOUS
  Administered 2013-07-05: 06:00:00 via INTRAVENOUS
  Administered 2013-07-05: 5.72 mg via INTRAVENOUS
  Administered 2013-07-05: 6 mg via INTRAVENOUS
  Administered 2013-07-05: 3.44 mg via INTRAVENOUS
  Filled 2013-07-02 (×11): qty 25

## 2013-07-02 MED ORDER — OXYCODONE HCL 5 MG PO TABS
10.0000 mg | ORAL_TABLET | ORAL | Status: DC
  Administered 2013-07-02 – 2013-07-03 (×7): 10 mg via ORAL
  Filled 2013-07-02 (×8): qty 2

## 2013-07-02 NOTE — Progress Notes (Signed)
SICKLE CELL SERVICE PROGRESS NOTE  [name] JGG:[id number] DOB: January 15, 1991 DOA: 06/28/2013 PCP: Rhilee Currin A., MD  Assessment/Plan: Principal Problem:   SICKLE CELL ANEMIA Active Problems:   Depression   Hb-SS disease with crisis   Sickle cell pain crisis   Sickle cell hemolytic anemia   Migraines   Hyperkalemia  1. Hb SS with crisis: Pt was treated with IVF, Toradol and IV analgesics. Pt was initially treated with weight based rapid re-doing and then transitioned to a weight based PCA (Dilaudid/Morpiine). She has total use of 28.2 mg with 46 demands and 37 deliveries. Pain has decreased from 7/10 to 5/10. I will continue nighttime Methadone, PCA at reduced PCA dosing and  Schedule Oxycodone in transition to oral medications. Will re-evaluate pain tomorrow and anticipate discharge home tomorrow. 2. Hyperkalemia: Likely secondary to hemolysis. Will re-check tomorrow. 3. Leukocytosis: No evidence of infection. Likely associated with crisis. Will re-evaluate tomorrow.  Code Status: Full Code Family Communication: N/A Disposition Plan: Likely home tomorrow  Pager [phone]. If 7PM-7AM, please contact night-coverage.  07/02/2013, 10:08 AM  LOS: 4 days   Brief narrative: Patient is a 23 year old female that [facility] 3 day history of pain. Patient states that pain was 4-5/10 upon awakening this am, but has currently increased to 7/10. Patient states that she took prescribed dose of Oxycodone 10 mg on 06/28/2013 at 10 am, with minimal relief. Patient states that she has not had much relief in 3 days on current regimen. Current pain is localized to wrist and ankles bilaterally, and is localized to right groin. Patient states that she increased hydration at home because she was trying to prevent a hospital admission. Her pain was not adequately controlled in the [facility] and she was adnmitted for further  treatment.   Consultants:  None  Procedures:  None  Antibiotics:  None  HPI/Subjective: Pt states that she is feeling better. Pain is currently at 6/10. No BM since Sunday.  Objective: Filed Vitals:   07/01/13 2357 07/02/13 0121 07/02/13 0322 07/02/13 0524  BP:  97/62  105/53  Pulse:  97  88  Temp:    98 F (36.7 C)  TempSrc:    Oral  Resp: 10  16 20   Height:      Weight:      SpO2:    99%   Weight change:   Intake/Output Summary (Last 24 hours) at 07/02/13 1008 Last data filed at 07/01/13 1905  Gross per 24 hour  Intake 3576.67 ml  Output      0 ml  Net 3576.67 ml    General: Alert, awake, oriented x3, in no acute distress. Comfortable appearing. HEENT: [place]/AT PEERL, EOMI, anicteric Heart: Regular rate and rhythm, without murmurs, rubs, gallops.  Lungs: Clear to auscultation, no wheezing or rhonchi noted.  Abdomen: Soft, nontender, nondistended, positive bowel sounds, no masses no hepatosplenomegaly noted..  Neuro: No focal neurological deficits noted cranial nerves II through XII grossly intact. DTRs 2+ bilaterally upper and lower extremities. Strength normal in bilateral upper and lower extremities. Musculoskeletal: No warm swelling or erythema around joints, no spinal tenderness noted. Psychiatric: Patient alert and oriented x3, good insight and cognition, good recent to remote recall.   Data Reviewed: Basic Metabolic Panel:  Recent Labs Lab 06/28/13 1620 06/29/13 1248 07/02/13 0515  NA 139  --  136*  K 3.9  --  5.6*  CL 104  --  101  CO2 22  --  24  GLUCOSE 97  --  99  BUN 4*  --  7  CREATININE 0.55 0.58 0.60  CALCIUM 9.2  --  8.5   Liver Function Tests:  Recent Labs Lab 06/28/13 1620 07/02/13 0515  AST 17 63*  ALT 10 18  ALKPHOS 55 59  BILITOT 2.0* 2.2*  PROT 7.4 6.9  ALBUMIN 4.1 3.8   No results found for this basename: LIPASE, AMYLASE,  in the last 168 hours No results found for this basename: AMMONIA,  in the last 168  hours CBC:  Recent Labs Lab 06/28/13 1620 06/29/13 1248 07/01/13 0503 07/02/13 0515  WBC 12.9* 14.1* 16.6* 17.4*  NEUTROABS 7.9*  --  8.9* 10.4*  HGB 9.3* 8.8* 8.9* 8.7*  HCT 26.4* 24.6* 24.7* 24.0*  MCV 91.0 88.8 89.8 90.6  PLT 411* 467* 532* 441*   Cardiac Enzymes: No results found for this basename: CKTOTAL, CKMB, CKMBINDEX, TROPONINI,  in the last 168 hours BNP (last 3 results) No results found for this basename: PROBNP,  in the last 8760 hours CBG: No results found for this basename: GLUCAP,  in the last 168 hours  No results found for this or any previous visit (from the past 240 hour(s)).   Studies: No results found.  Scheduled Meds: . enoxaparin (LOVENOX) injection  40 mg Subcutaneous Q24H  . folic acid  1.5 mg Oral QHS  . HYDROmorphone PCA 0.3 mg/mL   Intravenous 6 times per day  . hydroxyurea  1,000 mg Oral QHS  . ketorolac  30 mg Intravenous 4 times per day  . methadone  5 mg Oral QHS  . oxyCODONE  10 mg Oral Q4H  . prenatal multivitamin  1 tablet Oral QHS  . sertraline  50 mg Oral QHS   Continuous Infusions:   Total time 37 minutes

## 2013-07-03 DIAGNOSIS — D5700 Hb-SS disease with crisis, unspecified: Secondary | ICD-10-CM | POA: Diagnosis not present

## 2013-07-03 LAB — CBC WITH DIFFERENTIAL/PLATELET
BASOS PCT: 0 % (ref 0–1)
Basophils Absolute: 0 10*3/uL (ref 0.0–0.1)
Eosinophils Absolute: 0.8 10*3/uL — ABNORMAL HIGH (ref 0.0–0.7)
Eosinophils Relative: 5 % (ref 0–5)
HEMATOCRIT: 22.4 % — AB (ref 36.0–46.0)
Hemoglobin: 7.6 g/dL — ABNORMAL LOW (ref 12.0–15.0)
LYMPHS PCT: 25 % (ref 12–46)
Lymphs Abs: 4.2 10*3/uL — ABNORMAL HIGH (ref 0.7–4.0)
MCH: 31.3 pg (ref 26.0–34.0)
MCHC: 33.9 g/dL (ref 30.0–36.0)
MCV: 92.2 fL (ref 78.0–100.0)
MONOS PCT: 10 % (ref 3–12)
Monocytes Absolute: 1.7 10*3/uL — ABNORMAL HIGH (ref 0.1–1.0)
NEUTROS ABS: 10.2 10*3/uL — AB (ref 1.7–7.7)
Neutrophils Relative %: 60 % (ref 43–77)
Platelets: 496 10*3/uL — ABNORMAL HIGH (ref 150–400)
RBC: 2.43 MIL/uL — ABNORMAL LOW (ref 3.87–5.11)
RDW: 19.2 % — ABNORMAL HIGH (ref 11.5–15.5)
WBC: 16.9 10*3/uL — AB (ref 4.0–10.5)

## 2013-07-03 LAB — COMPREHENSIVE METABOLIC PANEL
ALT: 13 U/L (ref 0–35)
AST: 30 U/L (ref 0–37)
Albumin: 3.9 g/dL (ref 3.5–5.2)
Alkaline Phosphatase: 62 U/L (ref 39–117)
BUN: 11 mg/dL (ref 6–23)
CO2: 25 mEq/L (ref 19–32)
Calcium: 9 mg/dL (ref 8.4–10.5)
Chloride: 103 mEq/L (ref 96–112)
Creatinine, Ser: 0.77 mg/dL (ref 0.50–1.10)
GFR calc non Af Amer: >90 mL/min (ref >90)
GLUCOSE: 92 mg/dL (ref 70–99)
Potassium: 4.6 mEq/L (ref 3.7–5.3)
SODIUM: 140 meq/L (ref 137–147)
TOTAL PROTEIN: 7.1 g/dL (ref 6.0–8.3)
Total Bilirubin: 2.2 mg/dL — ABNORMAL HIGH (ref 0.3–1.2)

## 2013-07-03 MED ORDER — OXYCODONE HCL 5 MG PO TABS
15.0000 mg | ORAL_TABLET | ORAL | Status: DC
  Administered 2013-07-03 – 2013-07-05 (×9): 15 mg via ORAL
  Filled 2013-07-03 (×11): qty 3

## 2013-07-03 MED ORDER — HYDROMORPHONE HCL PF 2 MG/ML IJ SOLN
2.0000 mg | Freq: Once | INTRAMUSCULAR | Status: AC
  Administered 2013-07-03: 2 mg via INTRAVENOUS
  Filled 2013-07-03: qty 1

## 2013-07-03 NOTE — Progress Notes (Signed)
SICKLE CELL SERVICE PROGRESS NOTE  [name] XIP:[id number] DOB: 10/18/1990 DOA: 06/28/2013 PCP: Johnluke Haugen A., MD  Assessment/Plan: Principal Problem:   SICKLE CELL ANEMIA Active Problems:   Depression   Hb-SS disease with crisis   Sickle cell pain crisis   Sickle cell hemolytic anemia   Migraines   Hyperkalemia  1. Hb SS with crisis: Pt was treated with IVF, Toradol and IV analgesics. Pt was initially treated with weight based rapid re-doing and then transitioned to a weight based PCA (Dilaudid/Morpiine). She has total use of 29.26 mg with 51 demands and 50 deliveries. Pain was 4/10 upon awakening this morning but is now 6/10. This is likely in response to changes in Barometric pressure. I will continue nighttime Methadone, PCA at current PCA dosing and  Schedule Oxycodone 15 mg q 4 h in transition to oral medications. Will re-evaluate pain tomorrow and anticipate discharge home tomorrow. 2. Hyperkalemia: resolved. Likely secondary to Hemolysis 3. Leukocytosis: No evidence of infection. Likely associated with crisis. Will continue to monitor. 4. Constipation: Pt had a BM yesterday.  Code Status: Full Code Family Communication: N/A Disposition Plan: Likely home tomorrow  Pager [phone]. If 7PM-7AM, please contact night-coverage.  07/03/2013, 12:46 PM  LOS: 5 days   Brief narrative: Patient is a 23 year old female that [facility] 3 day history of pain. Patient states that pain was 4-5/10 upon awakening this am, but has currently increased to 7/10. Patient states that she took prescribed dose of Oxycodone 10 mg on 06/28/2013 at 10 am, with minimal relief. Patient states that she has not had much relief in 3 days on current regimen. Current pain is localized to wrist and ankles bilaterally, and is localized to right groin. Patient states that she increased hydration at home because she was trying to prevent a hospital admission. Her pain was not  adequately controlled in the [facility] and she was adnmitted for further treatment.   Consultants:  None  Procedures:  None  Antibiotics:  None  HPI/Subjective: Pt states that she is feeling better. Pain is currently at 6/10. BM yesterday.  Objective: Filed Vitals:   07/03/13 0541 07/03/13 0639 07/03/13 0745 07/03/13 1000  BP:  100/64  104/72  Pulse:  90  105  Temp:  97.9 F (36.6 C)  98.1 F (36.7 C)  TempSrc:  Oral  Oral  Resp: 18 18 13 15   Height:      Weight:      SpO2:  95% 93% 94%   Weight change:  No intake or output data in the 24 hours ending 07/03/13 1246  General: Alert, awake, oriented x3, in no acute distress. Comfortable appearing. HEENT: [place]/AT PEERL, EOMI, anicteric Heart: Regular rate and rhythm, without murmurs, rubs, gallops.  Lungs: Clear to auscultation, no wheezing or rhonchi noted.  Abdomen: Soft, nontender, nondistended, positive bowel sounds, no masses no hepatosplenomegaly noted..  Neuro: No focal neurological deficits noted cranial nerves II through XII grossly intact.  Strength normal in bilateral upper and lower extremities. Musculoskeletal: No warm swelling or erythema around joints, no spinal tenderness noted. Psychiatric: Patient alert and oriented x3, good insight and cognition, good recent to remote recall.   Data Reviewed: Basic Metabolic Panel:  Recent Labs Lab 06/28/13 1620 06/29/13 1248 07/02/13 0515 07/03/13 0453  NA 139  --  136* 140  K 3.9  --  5.6* 4.6  CL 104  --  101 103  CO2 22  --  24 25  GLUCOSE 97  --  99 92  BUN 4*  --  7 11  CREATININE 0.55 0.58 0.60 0.77  CALCIUM 9.2  --  8.5 9.0   Liver Function Tests:  Recent Labs Lab 06/28/13 1620 07/02/13 0515 07/03/13 0453  AST 17 63* 30  ALT 10 18 13   ALKPHOS 55 59 62  BILITOT 2.0* 2.2* 2.2*  PROT 7.4 6.9 7.1  ALBUMIN 4.1 3.8 3.9   No results found for this basename: LIPASE, AMYLASE,  in the last 168 hours No results found for this basename:  AMMONIA,  in the last 168 hours CBC:  Recent Labs Lab 06/28/13 1620 06/29/13 1248 07/01/13 0503 07/02/13 0515 07/03/13 0453  WBC 12.9* 14.1* 16.6* 17.4* 16.9*  NEUTROABS 7.9*  --  8.9* 10.4* 10.2*  HGB 9.3* 8.8* 8.9* 8.7* 7.6*  HCT 26.4* 24.6* 24.7* 24.0* 22.4*  MCV 91.0 88.8 89.8 90.6 92.2  PLT 411* 467* 532* 441* 496*   Cardiac Enzymes: No results found for this basename: CKTOTAL, CKMB, CKMBINDEX, TROPONINI,  in the last 168 hours BNP (last 3 results) No results found for this basename: PROBNP,  in the last 8760 hours CBG: No results found for this basename: GLUCAP,  in the last 168 hours  No results found for this or any previous visit (from the past 240 hour(s)).   Studies: No results found.  Scheduled Meds: . enoxaparin (LOVENOX) injection  40 mg Subcutaneous Q24H  . folic acid  1.5 mg Oral QHS  .  HYDROmorphone (DILAUDID) injection  2 mg Intravenous Once  . HYDROmorphone PCA 0.3 mg/mL   Intravenous 6 times per day  . hydroxyurea  1,000 mg Oral QHS  . ketorolac  30 mg Intravenous 4 times per day  . methadone  5 mg Oral QHS  . oxyCODONE  15 mg Oral Q4H  . sertraline  50 mg Oral QHS   Continuous Infusions:   Total time 39 minutes

## 2013-07-04 DIAGNOSIS — D5700 Hb-SS disease with crisis, unspecified: Secondary | ICD-10-CM | POA: Diagnosis not present

## 2013-07-04 LAB — CBC WITH DIFFERENTIAL/PLATELET
Basophils Absolute: 0.2 10*3/uL — ABNORMAL HIGH (ref 0.0–0.1)
Basophils Relative: 1 % (ref 0–1)
Eosinophils Absolute: 1.8 10*3/uL — ABNORMAL HIGH (ref 0.0–0.7)
Eosinophils Relative: 9 % — ABNORMAL HIGH (ref 0–5)
HEMATOCRIT: 19.5 % — AB (ref 36.0–46.0)
Hemoglobin: 6.8 g/dL — CL (ref 12.0–15.0)
LYMPHS ABS: 3.7 10*3/uL (ref 0.7–4.0)
Lymphocytes Relative: 19 % (ref 12–46)
MCH: 31.9 pg (ref 26.0–34.0)
MCHC: 34.9 g/dL (ref 30.0–36.0)
MCV: 91.5 fL (ref 78.0–100.0)
MONOS PCT: 9 % (ref 3–12)
Monocytes Absolute: 1.8 10*3/uL — ABNORMAL HIGH (ref 0.1–1.0)
NEUTROS ABS: 12.2 10*3/uL — AB (ref 1.7–7.7)
Neutrophils Relative %: 62 % (ref 43–77)
Platelets: 440 10*3/uL — ABNORMAL HIGH (ref 150–400)
RBC: 2.13 MIL/uL — AB (ref 3.87–5.11)
RDW: 20 % — ABNORMAL HIGH (ref 11.5–15.5)
WBC: 19.7 10*3/uL — AB (ref 4.0–10.5)

## 2013-07-04 MED ORDER — ACETAMINOPHEN 325 MG PO TABS: 650.0000 mg | ORAL_TABLET | Freq: Four times a day (QID) | ORAL | Status: DC | PRN

## 2013-07-04 MED ORDER — IBUPROFEN 800 MG PO TABS
800.0000 mg | ORAL_TABLET | Freq: Three times a day (TID) | ORAL | Status: DC
  Administered 2013-07-04 – 2013-07-05 (×2): 800 mg via ORAL
  Filled 2013-07-04 (×6): qty 1

## 2013-07-04 NOTE — Progress Notes (Signed)
Subjective:  Patient is having 8/10 pain today as well as severe headache. . She has completed 5 days of Toradol and has no NSAID. She has been doing well until last night when her PCA malfunctioned. She has used 21mg  of Dilaudid with 40 demands and 37 deliveries in the last 24 hours. No fever, no NVD. Objective: Vital signs in last 24 hours: Temp:  [97.5 F (36.4 C)-99.3 F (37.4 C)] 98.3 F (36.8 C) (02/19 0730) Pulse Rate:  [98-113] 101 (02/19 0730) Resp:  [13-16] 16 (02/19 0730) BP: (95-114)/(58-77) 108/75 mmHg (02/19 0730) SpO2:  [93 %-97 %] 94 % (02/19 0730) FiO2 (%):  [96 %] 96 % (02/18 1702) Weight change:  Last BM Date: 07/03/13  Intake/Output from previous day: 02/18 0701 - 02/19 0700 In: 1023 [P.O.:960; I.V.:63] Out: -  Intake/Output this shift:    General appearance: alert, cooperative and no distress Eyes: conjunctivae/corneas clear. PERRL, EOM's intact. Fundi benign. Neck: no adenopathy, no carotid bruit, no JVD, supple, symmetrical, trachea midline and thyroid not enlarged, symmetric, no tenderness/mass/nodules Back: symmetric, no curvature. ROM normal. No CVA tenderness. Resp: clear to auscultation bilaterally Chest wall: no tenderness Cardio: regular rate and rhythm, S1, S2 normal, no murmur, click, rub or gallop GI: soft, non-tender; bowel sounds normal; no masses,  no organomegaly Extremities: extremities normal, atraumatic, no cyanosis or edema Pulses: 2+ and symmetric Skin: Skin color, texture, turgor normal. No rashes or lesions Neurologic: Grossly normal  Lab Results:  Recent Labs  07/03/13 0453 07/04/13 0501  WBC 16.9* 19.7*  HGB 7.6* 6.8*  HCT 22.4* 19.5*  PLT 496* 440*   BMET  Recent Labs  07/02/13 0515 07/03/13 0453  NA 136* 140  K 5.6* 4.6  CL 101 103  CO2 24 25  GLUCOSE 99 92  BUN 7 11  CREATININE 0.60 0.77  CALCIUM 8.5 9.0    Studies/Results: No results found.  Medications: I have reviewed the patient's current  medications.  Assessment/Plan: A 23 yo admitted with sickle Cell painful crisis.  #1 Sickle Cell Painful Crisis: Will maintain current PCA dosing with additional oral medications as per home dose. Will add Ibuprofen 800 mg TID and Tylenol PRN for her headaches.  #2 Sickle Cell Anemia: H/H has dropped some. Will transfuse PRBC if less than 6.5g hemoglobin.  #3 Cough: Improved. Will continue supportive care.  #4 Depression/Anxiety: Continue home medications.  #5 Leucocytosis: Worsening. Most likely due to [place].  #6 Tobacco abuse: Counseled.   LOS: 6 days   GARBA,LAWAL 07/04/2013, 8:55 AM

## 2013-07-04 NOTE — Progress Notes (Signed)
CRITICAL VALUE ALERT  Critical value received:  Hgb 6.8   Date of notification:  07/04/13  Time of notification:  0600  Critical value read back:yes  Nurse who received alert:  Cindi Carbon   MD notified (1st page):  [facility]  Time of first page:  0605  MD notified (2nd page):  Time of second page:  Responding MD:  K. Shorr  Time MD responded:  [phone]

## 2013-07-05 DIAGNOSIS — K219 Gastro-esophageal reflux disease without esophagitis: Secondary | ICD-10-CM

## 2013-07-05 DIAGNOSIS — D5700 Hb-SS disease with crisis, unspecified: Secondary | ICD-10-CM | POA: Diagnosis not present

## 2013-07-05 DIAGNOSIS — G8929 Other chronic pain: Secondary | ICD-10-CM | POA: Diagnosis not present

## 2013-07-05 DIAGNOSIS — D649 Anemia, unspecified: Secondary | ICD-10-CM

## 2013-07-05 DIAGNOSIS — M549 Dorsalgia, unspecified: Secondary | ICD-10-CM

## 2013-07-05 LAB — COMPREHENSIVE METABOLIC PANEL
ALT: 20 U/L (ref 0–35)
AST: 33 U/L (ref 0–37)
Albumin: 3.8 g/dL (ref 3.5–5.2)
Alkaline Phosphatase: 52 U/L (ref 39–117)
BUN: 6 mg/dL (ref 6–23)
CO2: 26 meq/L (ref 19–32)
CREATININE: 0.64 mg/dL (ref 0.50–1.10)
Calcium: 9.3 mg/dL (ref 8.4–10.5)
Chloride: 100 mEq/L (ref 96–112)
GFR calc Af Amer: >90 mL/min (ref >90)
Glucose, Bld: 101 mg/dL — ABNORMAL HIGH (ref 70–99)
Potassium: 4.2 mEq/L (ref 3.7–5.3)
Sodium: 138 mEq/L (ref 137–147)
Total Bilirubin: 3 mg/dL — ABNORMAL HIGH (ref 0.3–1.2)
Total Protein: 7 g/dL (ref 6.0–8.3)

## 2013-07-05 LAB — CBC WITH DIFFERENTIAL/PLATELET
BASOS PCT: 1 % (ref 0–1)
Basophils Absolute: 0.1 10*3/uL (ref 0.0–0.1)
EOS PCT: 3 % (ref 0–5)
Eosinophils Absolute: 0.4 10*3/uL (ref 0.0–0.7)
HCT: 20.5 % — ABNORMAL LOW (ref 36.0–46.0)
Hemoglobin: 7.1 g/dL — ABNORMAL LOW (ref 12.0–15.0)
Lymphocytes Relative: 19 % (ref 12–46)
Lymphs Abs: 2.5 10*3/uL (ref 0.7–4.0)
MCH: 32.6 pg (ref 26.0–34.0)
MCHC: 34.6 g/dL (ref 30.0–36.0)
MCV: 94 fL (ref 78.0–100.0)
MONO ABS: 1.6 10*3/uL — AB (ref 0.1–1.0)
MONOS PCT: 12 % (ref 3–12)
Neutro Abs: 8.8 10*3/uL — ABNORMAL HIGH (ref 1.7–7.7)
Neutrophils Relative %: 65 % (ref 43–77)
Platelets: 493 10*3/uL — ABNORMAL HIGH (ref 150–400)
RBC: 2.18 MIL/uL — AB (ref 3.87–5.11)
RDW: 21.4 % — ABNORMAL HIGH (ref 11.5–15.5)
WBC: 13.4 10*3/uL — AB (ref 4.0–10.5)

## 2013-07-05 MED ORDER — OXYCODONE HCL 10 MG PO TABS: 15.0000 mg | ORAL_TABLET | Freq: Four times a day (QID) | ORAL | Status: DC | PRN

## 2013-07-05 NOTE — Discharge Summary (Signed)
PHYSICIAN DISCHARGE SUMMARY   [name] VZC:[id number] DOB: August 06, 1990 DOA: 06/28/2013  PCP: MATTHEWS,MICHELLE A., MD  Admit date: 06/28/2013 Discharge date: 07/05/2013  Time spent: 36 minutes  Recommendations for Outpatient Follow-up:  1. CBC, BMP on followup   Discharge Diagnoses:  Principal Problem:   SICKLE CELL ANEMIA Active Problems:   Hb-SS disease with crisis   Depression   Sickle cell pain crisis   Sickle cell hemolytic anemia   Migraines   Hyperkalemia  Discharge Condition: stable   Diet recommendation: regular   Filed Weights   06/28/13 1438 06/28/13 1951  Weight: 182 lb (82.555 kg) 174 lb 9.6 oz (79.198 kg)    History of present illness:  Patient is a 23 year old female that [facility] with a 3 day history of pain. Patient states that pain was 4-5/10 upon awakening this am, but has currently increased to 7/10. Patient states that she took prescribed dose of Oxycodone 10 mg on 06/28/2013 at 10 am, with minimal relief. Patient states that she has not had much relief in 3 days on current regimen. Current pain is localized to wrist and ankles bilaterally, and is localized to right groin. Patient states that she increased hydration at home because she was trying to prevent a hospital admission.   Hospital Course:  1. Hb SS with crisis: Pt was treated with IVF, Toradol and IV analgesics. Pt was initially treated with weight based rapid re-doing and then transitioned to a weight based PCA (Dilaudid/Morpiine).  Pain is much better controlled now and patient is asking to go home.  She has prescriptions at her PCP office for her pain meds to pick up today.    2. Hyperkalemia: resolved. Likely secondary to Hemolysis 3. Leukocytosis: No evidence of infection. Likely associated with crisis. Improved.   4. Constipation: Pt had a BM. 5. Anemia - stable now.  Transfusion not indicated.   Discharge Exam: Pt is reporting that her pain is much better  controlled now.  She is in no apparent distress.   Filed Vitals:   07/05/13 0737  BP:   Pulse:   Temp:   Resp: 14   General: awake, alert, no apparent distress  Cardiovascular: normal s1, s2 sounds  Respiratory: BBS clear to auscultation Abd: soft, nondistended nontender Ext: no CCE   Discharge Instructions  Discharge Orders   Future Orders Complete By Expires   Discharge instructions  As directed    Comments:     Return if symptoms recur, worsen or new problems develop.   Discontinue IV  As directed    Increase activity slowly  As directed        Medication List         folic acid 128 MCG tablet  Commonly known as:  FOLVITE  Take 2 tablets (1,600 mcg total) by mouth every morning.     hydroxyurea 500 MG capsule  Commonly known as:  HYDREA  Take 2 capsules (1,000 mg total) by mouth at bedtime. May take with food to minimize GI side effects.     ibuprofen 400 MG tablet  Commonly known as:  ADVIL,MOTRIN  Take 400 mg by mouth every 6 (six) hours as needed.     methadone 5 MG tablet  Commonly known as:  DOLOPHINE  Take 1 tablet (5 mg total) by mouth at bedtime.     ondansetron 4 MG tablet  Commonly known as:  ZOFRAN  Take 4 mg by mouth every 6 (six)  hours as needed for nausea or vomiting.     Oxycodone HCl 10 MG Tabs  Take 1.5 tablets (15 mg total) by mouth every 6 (six) hours as needed.     prenatal multivitamin Tabs tablet  Take 1 tablet by mouth daily at 12 noon.       Allergies  Allergen Reactions  . Carrot Oil Hives and Swelling  . Latex Rash       Follow-up Information   Follow up with MATTHEWS,MICHELLE A., MD Today. (pick up prescriptions)    Specialty:  Internal Medicine   Contact information:   [place] [address] [phone]       Follow up with MATTHEWS,MICHELLE A., MD. Schedule an appointment as soon as possible for a visit in 1 week. [facility] Followup )    Specialty:  Internal Medicine   Contact information:    [place] [address] [phone]      The results of significant diagnostics from this hospitalization (including imaging, microbiology, ancillary and laboratory) are listed below for reference.    Significant Diagnostic Studies: No results found.  Microbiology: No results found for this or any previous visit (from the past 240 hour(s)).   Labs: Basic Metabolic Panel:  Recent Labs Lab 06/28/13 1620 06/29/13 1248 07/02/13 0515 07/03/13 0453 07/05/13 0515  NA 139  --  136* 140 138  K 3.9  --  5.6* 4.6 4.2  CL 104  --  101 103 100  CO2 22  --  24 25 26   GLUCOSE 97  --  99 92 101*  BUN 4*  --  7 11 6   CREATININE 0.55 0.58 0.60 0.77 0.64  CALCIUM 9.2  --  8.5 9.0 9.3   Liver Function Tests:  Recent Labs Lab 06/28/13 1620 07/02/13 0515 07/03/13 0453 07/05/13 0515  AST 17 63* 30 33  ALT 10 18 13 20   ALKPHOS 55 59 62 52  BILITOT 2.0* 2.2* 2.2* 3.0*  PROT 7.4 6.9 7.1 7.0  ALBUMIN 4.1 3.8 3.9 3.8   No results found for this basename: LIPASE, AMYLASE,  in the last 168 hours No results found for this basename: AMMONIA,  in the last 168 hours CBC:  Recent Labs Lab 07/01/13 0503 07/02/13 0515 07/03/13 0453 07/04/13 0501 07/05/13 0515  WBC 16.6* 17.4* 16.9* 19.7* 13.4*  NEUTROABS 8.9* 10.4* 10.2* 12.2* 8.8*  HGB 8.9* 8.7* 7.6* 6.8* 7.1*  HCT 24.7* 24.0* 22.4* 19.5* 20.5*  MCV 89.8 90.6 92.2 91.5 94.0  PLT 532* 441* 496* 440* 493*   Cardiac Enzymes: No results found for this basename: CKTOTAL, CKMB, CKMBINDEX, TROPONINI,  in the last 168 hours BNP: BNP (last 3 results) No results found for this basename: PROBNP,  in the last 8760 hours CBG: No results found for this basename: GLUCAP,  in the last 168 hours  Signed:  Clanford Johnson  [facility] 07/05/2013, 12:18 PM

## 2013-07-05 NOTE — Discharge Instructions (Signed)
Sickle Cell Anemia, Adult Sickle cell anemia is a condition where your red blood cells are shaped like sickles. Red blood cells carry oxygen through the body. Sickle-shaped red blood cells cells do not live as long as normal red blood cells. They also clump together and block blood from flowing through the blood vessels. These things prevent the body from getting enough oxygen. Sickle cell anemia causes organ damage and pain. It also increases the risk of infection. HOME CARE  Drink enough fluid to keep your pee (urine) clear or pale yellow. Drink more in hot weather and during exercise.  Do not smoke. Smoking lowers oxygen levels in the blood.  Only take over-the-counter or prescription medicines as told by your doctor.  Take antibiotic medicines as told by your doctor. Make sure you finish them it even if you start to feel better.  Take supplements as told by your doctor.  Consider wearing a medical alert bracelet. This tells anyone caring for you in an emergency of your condition.  When traveling, keep your medical information, doctors' names, and the medicines you take with you at all times.  If you have a fever, do not take fever medicines right away. This could cover up a problem. Tell your doctor.   Keep all follow-up appointments with your doctor. Sickle cell anemia requires regular medical care. GET HELP IF: You have a fever. GET HELP RIGHT AWAY IF:  You feel dizzy or faint.  You have new belly (abdominal) pain, especially on the left side near the stomach area.  You have a lasting, often uncomfortable and painful erection of the penis (priapism). If it is not treated right away, you will become unable to have sex (impotence).  You have numbness your arms or legs or you have a hard time moving them.  You have a hard time talking.  You have a fever or lasting symptoms for more than 2 3 days.  You have a fever and your symptoms suddenly get worse.  You have signs or  symptoms of infection. These include:  Chills.  Being more tired than normal (lethargy).  Irritability.  Poor eating.  Throwing up (vomiting).  You have pain that is not helped with medicine.  You have shortness of breath.  You have pain in your chest.  You are coughing up pus-like or bloody mucus.  You have a stiff neck.  Your feet or hands swell or have pain.  Your belly looks bloated.  Your joints hurt. MAKE SURE YOU:  Understand these instructions.  Will watch your condition.  Will get help right away if you are not doing well or get worse. Document Released: 02/20/2013 Document Reviewed: 12/12/2012 [facility] Patient Information 2014 [place]. Chronic Pain Chronic pain can be defined as pain that is off and on and lasts for 3 6 months or longer. Many things cause chronic pain, which can make it difficult to make a diagnosis. There are many treatment options available for chronic pain. However, finding a treatment that works well for you may require trying various approaches until the right one is found. Many people benefit from a combination of two or more types of treatment to control their pain. SYMPTOMS  Chronic pain can occur anywhere in the body and can range from mild to very severe. Some types of chronic pain include:  Headache.  Low back pain.  Cancer pain.  Arthritis pain.  Neurogenic pain. This is pain resulting from damage to nerves. People with chronic pain may  also have other symptoms such as:  Depression.  Anger.  Insomnia.  Anxiety. DIAGNOSIS  Your health care provider will help diagnose your condition over time. In many cases, the initial focus will be on excluding possible conditions that could be causing the pain. Depending on your symptoms, your health care provider may order tests to diagnose your condition. Some of these tests may include:   Blood tests.   CT scan.   MRI.   X-rays.   Ultrasounds.   Nerve  conduction studies.  You may need to see a specialist.  TREATMENT  Finding treatment that works well may take time. You may be referred to a pain specialist. He or she may prescribe medicine or therapies, such as:   Mindful meditation or yoga.  Shots (injections) of numbing or pain-relieving medicines into the spine or area of pain.  Local electrical stimulation.  Acupuncture.   Massage therapy.   Aroma, color, light, or sound therapy.   Biofeedback.   Working with a physical therapist to keep from getting stiff.   Regular, gentle exercise.   Cognitive or behavioral therapy.   Group support.  Sometimes, surgery may be recommended.  HOME CARE INSTRUCTIONS   Take all medicines as directed by your health care provider.   Lessen stress in your life by relaxing and doing things such as listening to calming music.   Exercise or be active as directed by your health care provider.   Eat a healthy diet and include things such as vegetables, fruits, fish, and lean meats in your diet.   Keep all follow-up appointments with your health care provider.   Attend a support group with others suffering from chronic pain. SEEK MEDICAL CARE IF:   Your pain gets worse.   You develop a new pain that was not there before.   You cannot tolerate medicines given to you by your health care provider.   You have new symptoms since your last visit with your health care provider.  SEEK IMMEDIATE MEDICAL CARE IF:   You feel weak.   You have decreased sensation or numbness.   You lose control of bowel or bladder function.   Your pain suddenly gets much worse.   You develop shaking.  You develop chills.  You develop confusion.  You develop chest pain.  You develop shortness of breath.  MAKE SURE YOU:  Understand these instructions.  Will watch your condition.  Will get help right away if you are not doing well or get worse. Document Released: 01/22/2002  Document Revised: 01/02/2013 Document Reviewed: 10/26/2012 [facility] Patient Information 2014 [place]. Pain Medicine Instructions Pain medicines may change the way you think. You might not be able to do some things safely. Take medicine as told by your doctor for pain. Do not take the medicine if you do not have pain, unless your doctor tells you to. You can take less medicine if a smaller amount makes your pain go away. It may not be possible to make all of your pain go away. However, you should be comfortable enough to move, breathe, and take care of yourself. After you start the medicine, and for 8 hours after you stop, do not:  Drive.  Use machines.  Use power tools.  Sign legal papers.  Take care of children by yourself.  Climb or go to high places.  Swim without an adult nearby to help you. HOME CARE   Do not drink alcohol until 8 hours after you stop taking  pain medicines.  Do not take sleeping pills until 8 hours after you stop taking pain medicines.  Do not take other medicines until 8 hours after you stop taking pain medicines, unless your doctor says it is okay.  Take a medicine to help you go poop (stool softener) if you cannot (constipated). Eating more fruits and vegetables can also help you poop.  Write down when you take your medicine. You may get confused when taking pain medicine. Writing down the times will help you to not take too much medicine (overdose). GET HELP RIGHT AWAY IF:   You feel dizzy or pass out (faint).  You feel there are other problems that might be caused by your medicine.  Your medicine is not helping the pain go away.  You throw up (vomit) or have watery poop (diarrhea).  You have pain in an area that did not hurt before. MAKE SURE YOU:   Understand these instructions.  Will watch your condition.  Will get help right away if you are not doing well or get worse. Document Released: 10/19/2007 Document Revised: 07/25/2011  Document Reviewed: 04/16/2010 [facility] Patient Information 2014 [place], [place].

## 2013-07-05 NOTE — Progress Notes (Signed)
Patient discharged home in stable condition, no change from morning assessment.  Discharge instructions provided to patient with verbal understanding.

## 2013-07-05 NOTE — Telephone Encounter (Signed)
Prescription issued for Oxycodone 10 mg #90 to be taken 15 mg (1.5 tabs) po q 6 hours PRN.

## 2013-07-12 IMAGING — CR DG SHOULDER 2+V*R*
3 series · 3 of 3 positions shown · non-contrast
Comparison: 06/22/2012

CLINICAL DATA: Right shoulder pain.  Sickle cell

RIGHT SHOULDER - 2+ VIEW

[w shoulder external right]
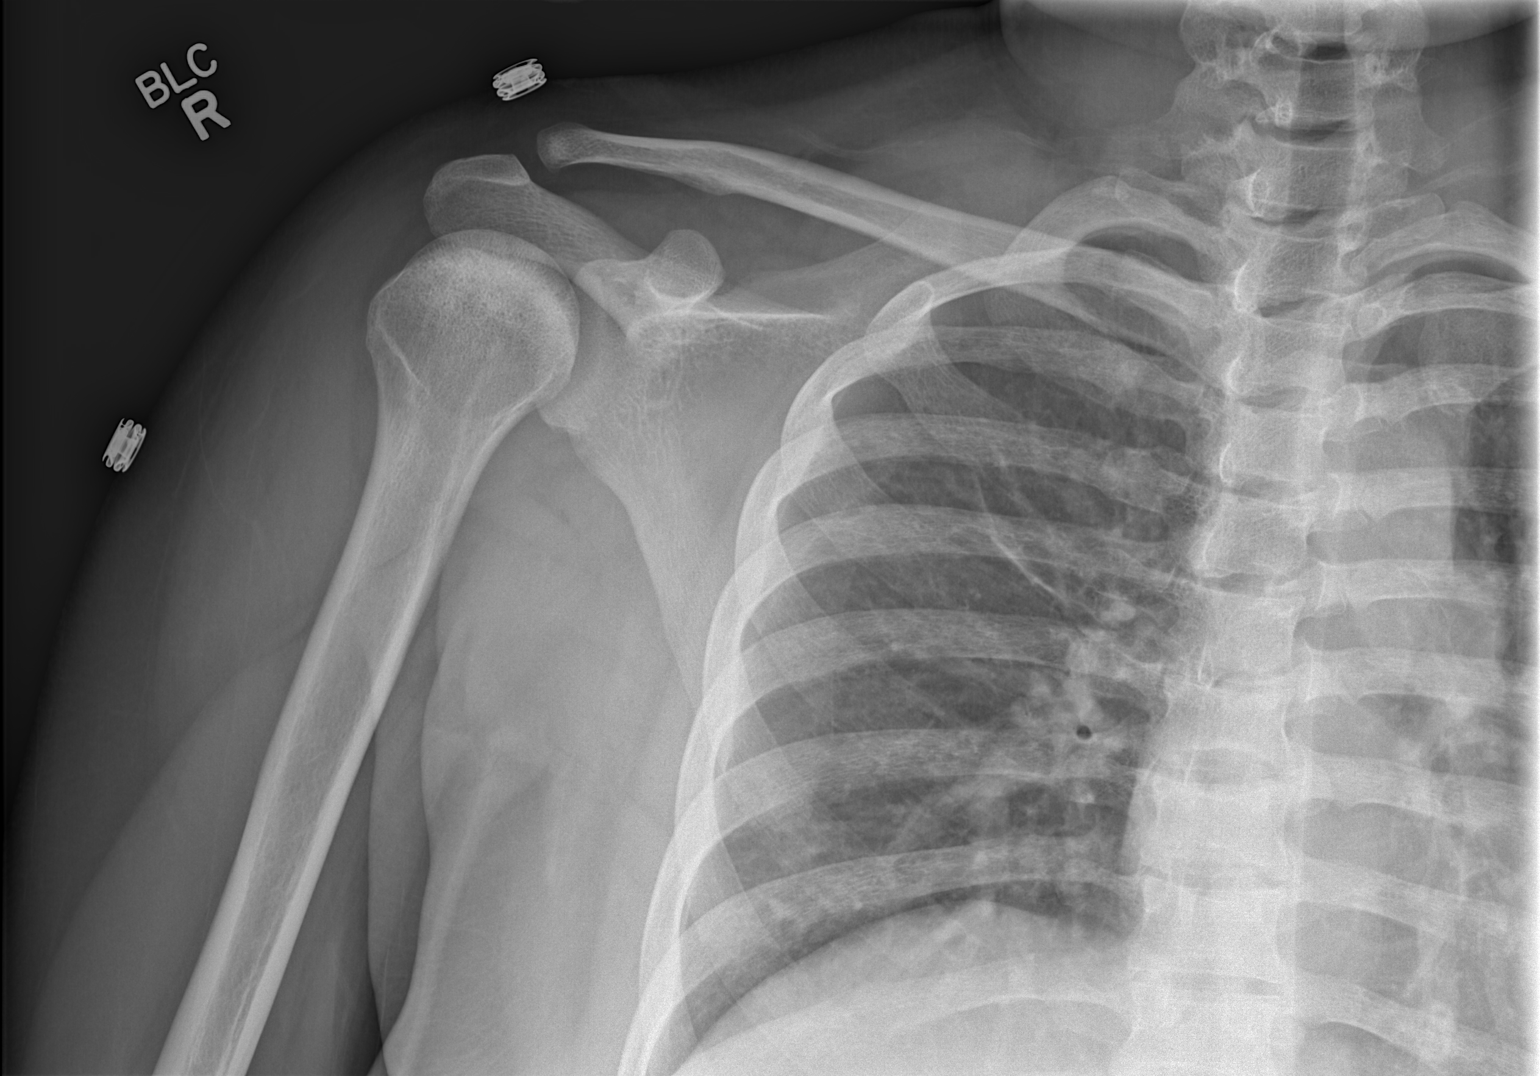

[w shoulder internal right]
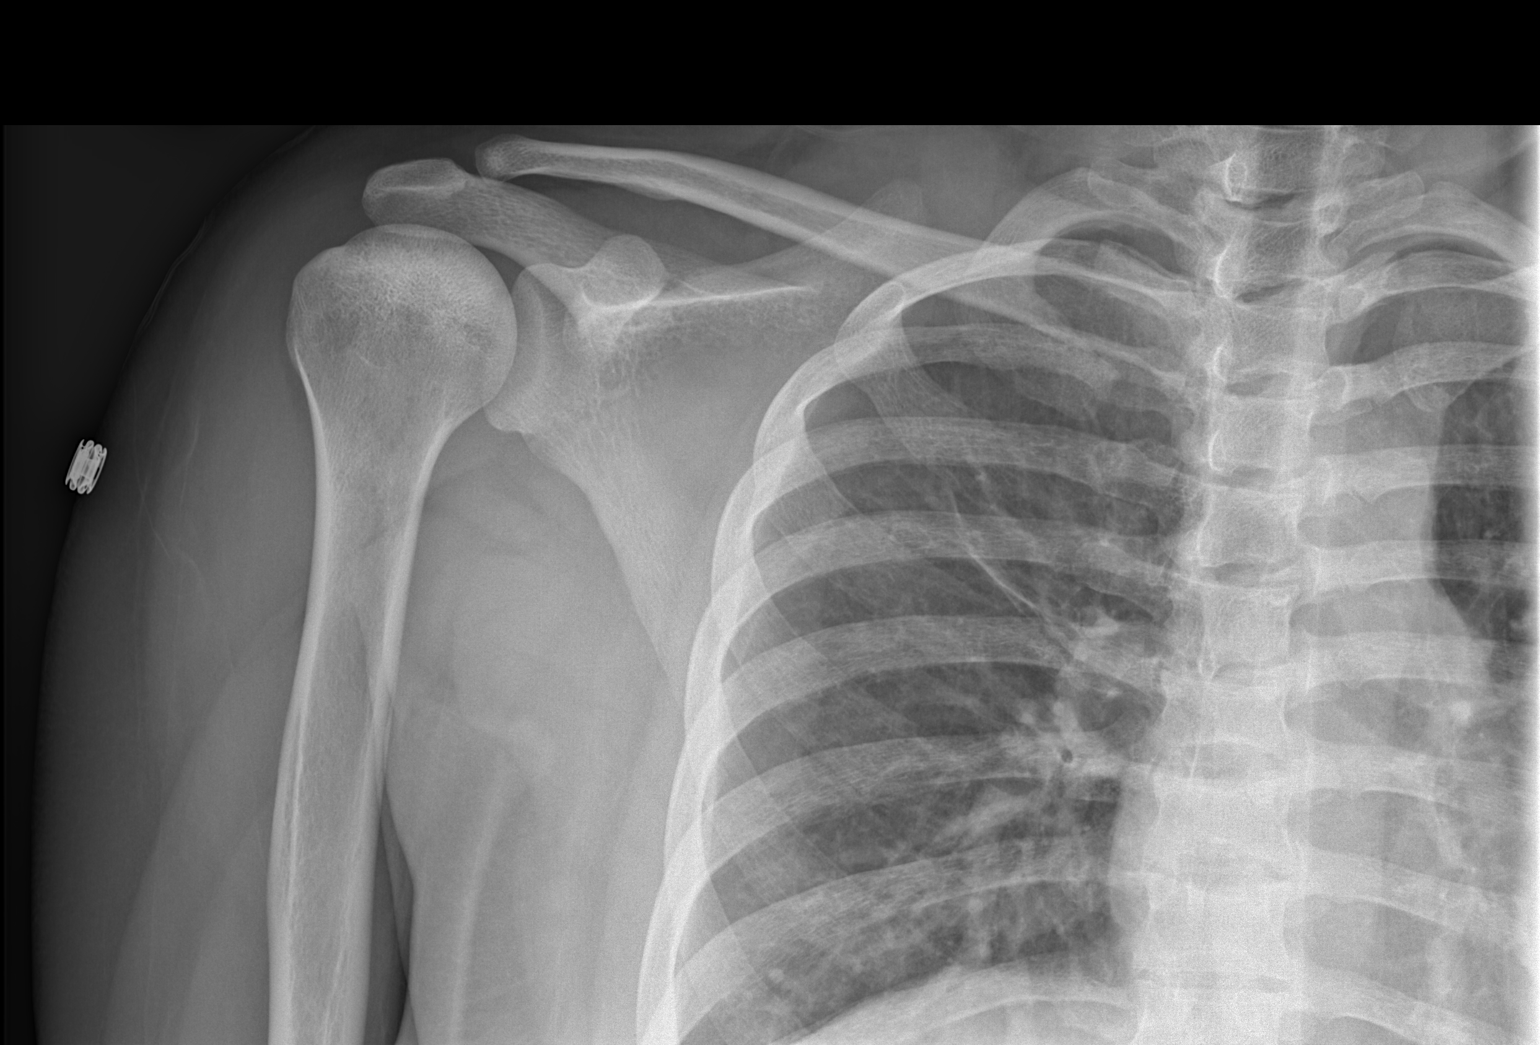

[w shoulder y-view right]
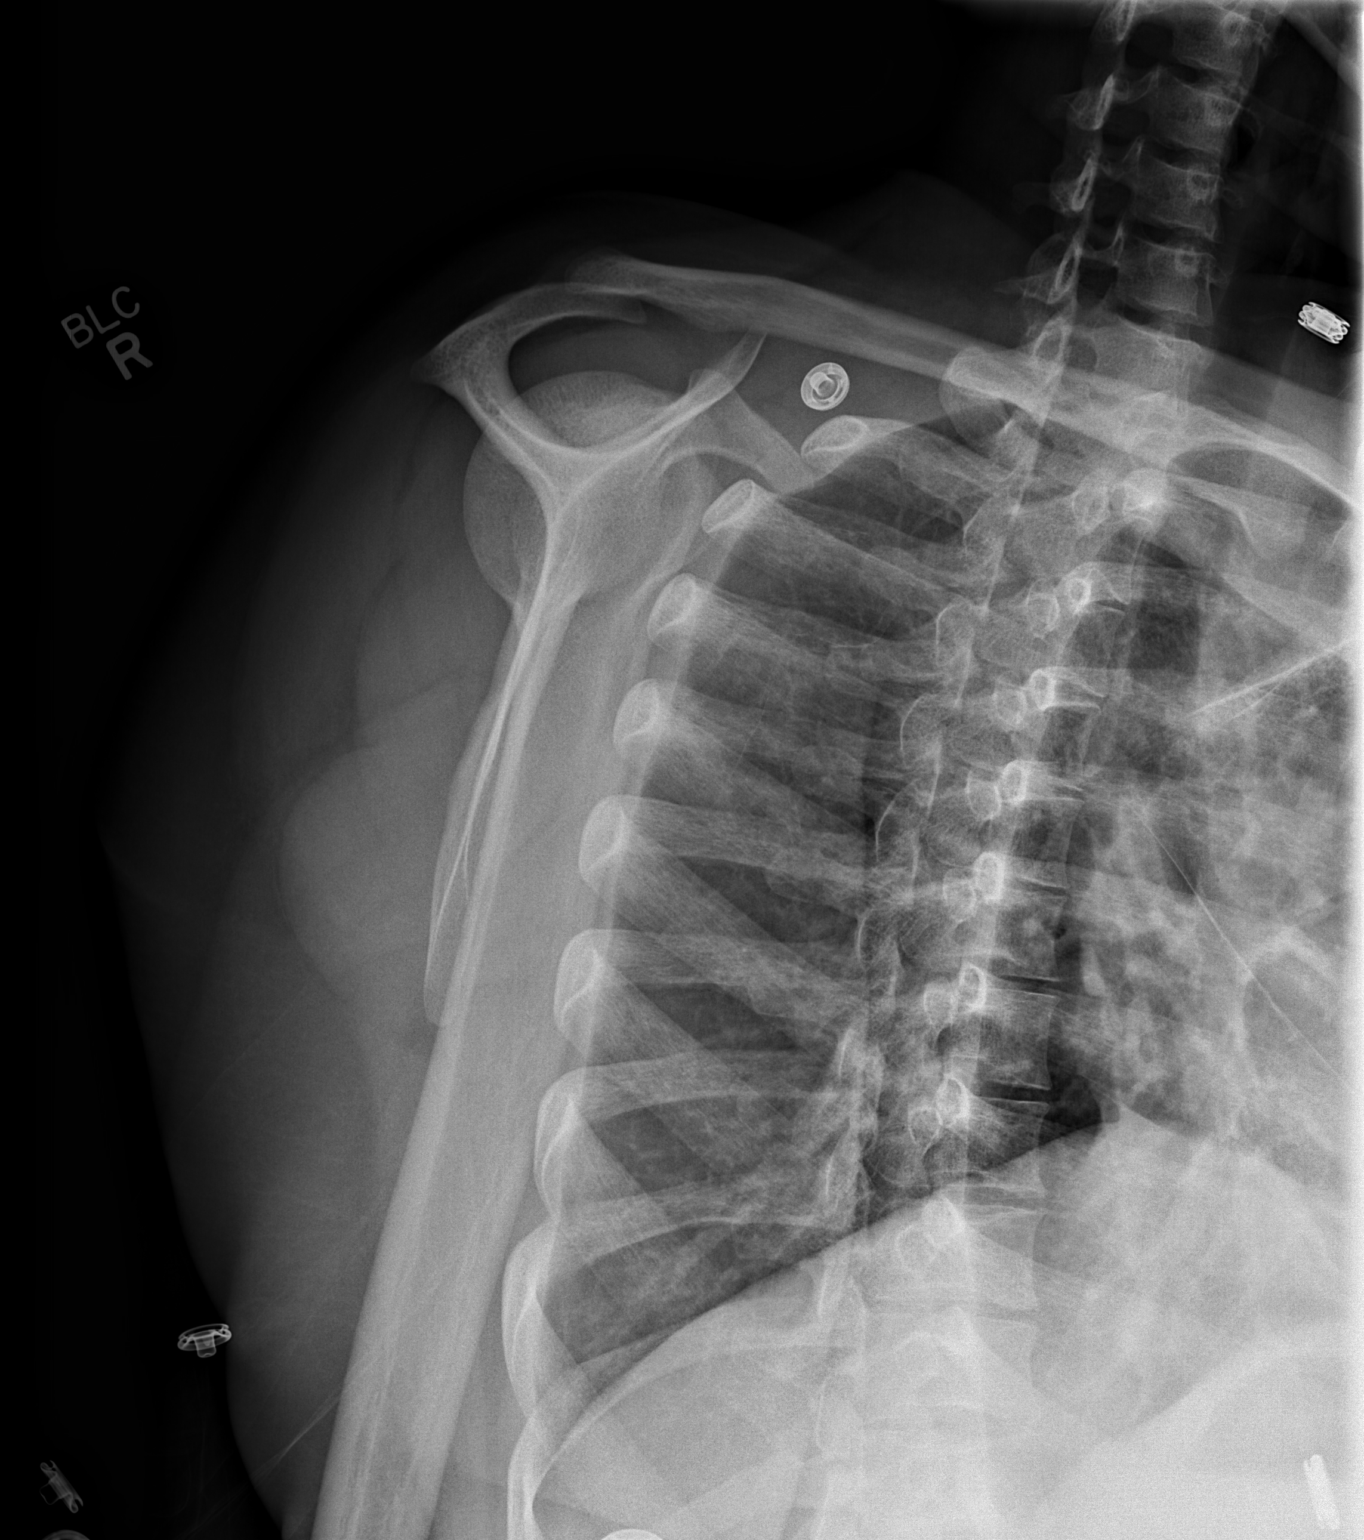

[3 of 3 positions shown; findings below may reference images not displayed]

FINDINGS: Sclerosis of the superior aspect of the humeral head is
unchanged.  No fracture.  Normal alignment and no significant
degenerative change.
IMPRESSION: AVN of the humeral head, unchanged from the prior study.  Negative
for fracture.

## 2013-07-12 IMAGING — CR DG CHEST 2V
2 series · 2 of 2 positions shown · non-contrast
Comparison: 06/22/2012

CLINICAL DATA: Sickle cell disease.  Back pain.

CHEST - 2 VIEW

[w chest pa]
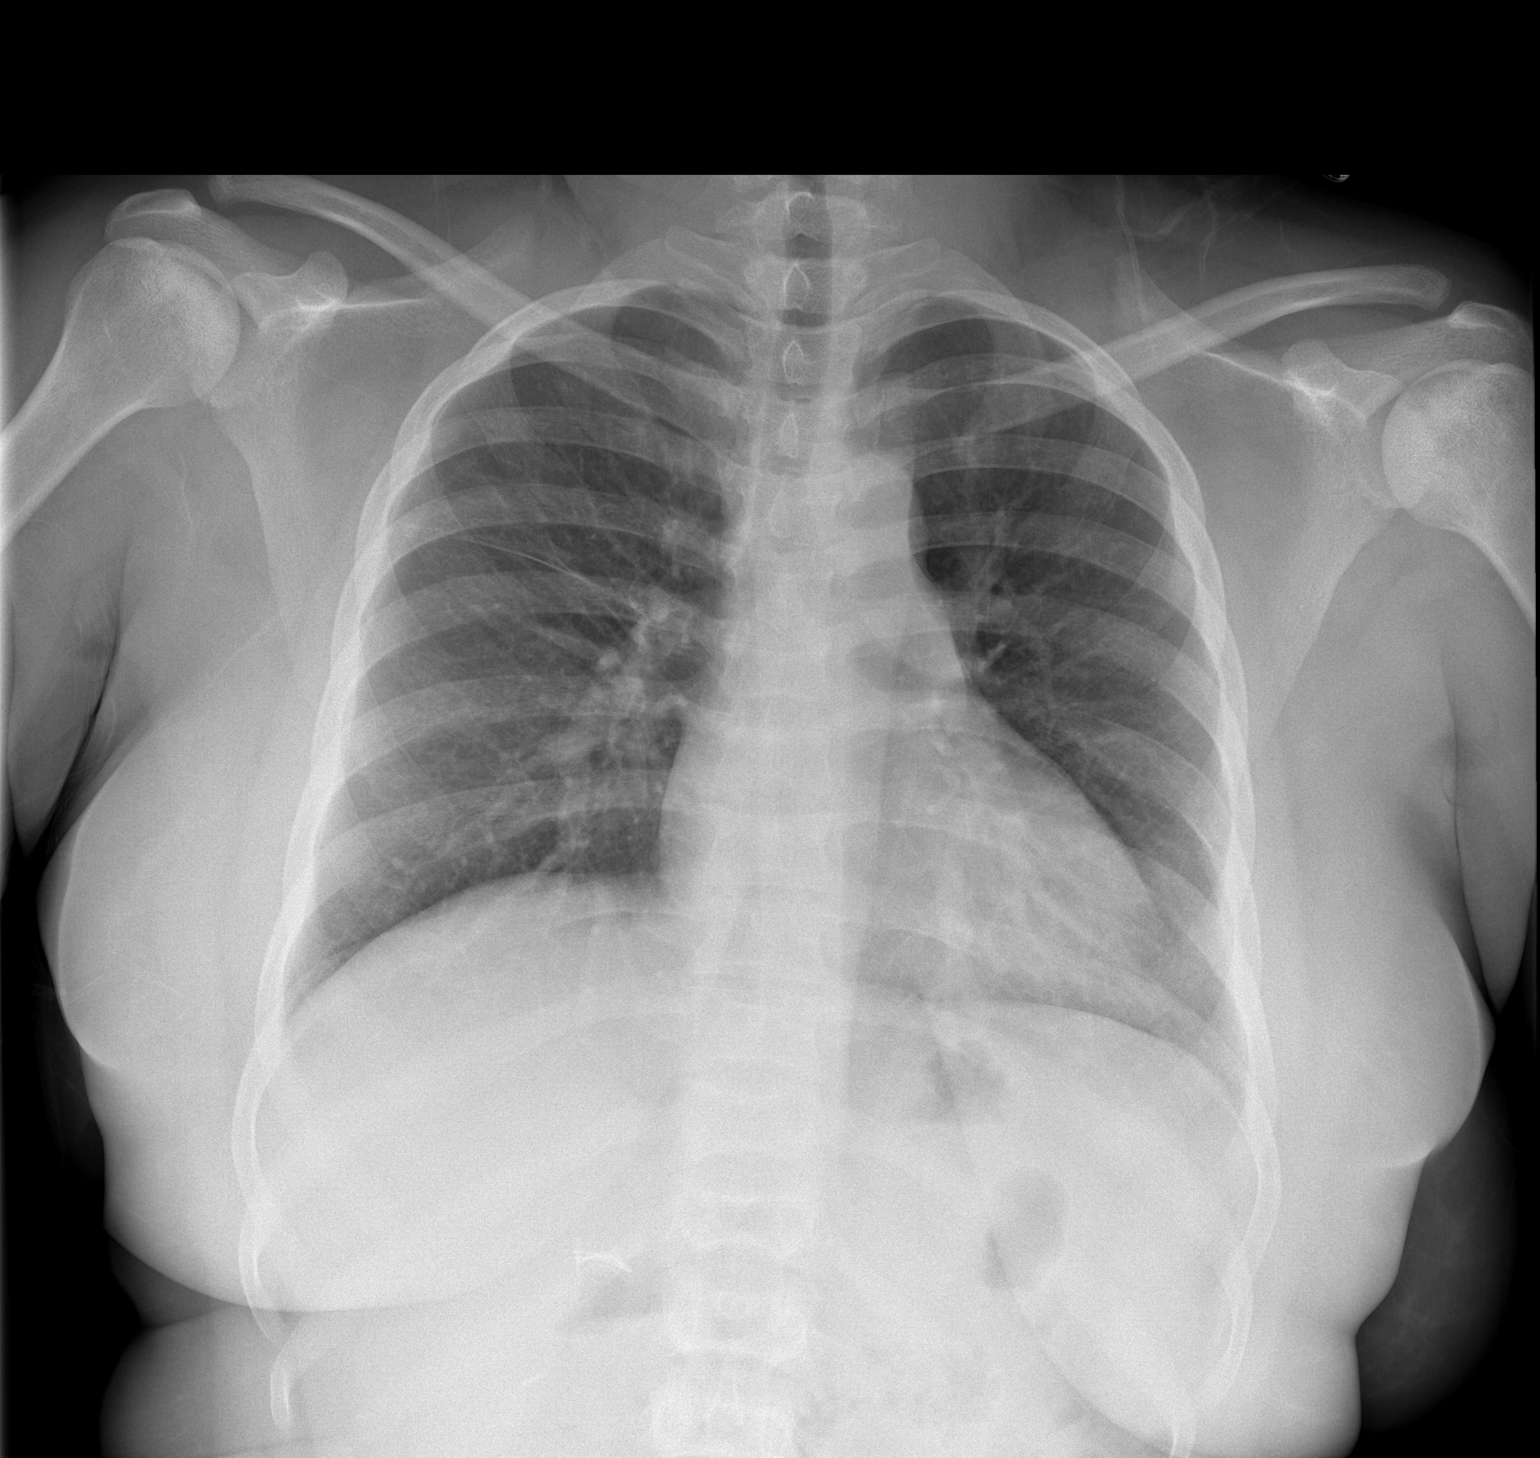

[w chest lat]
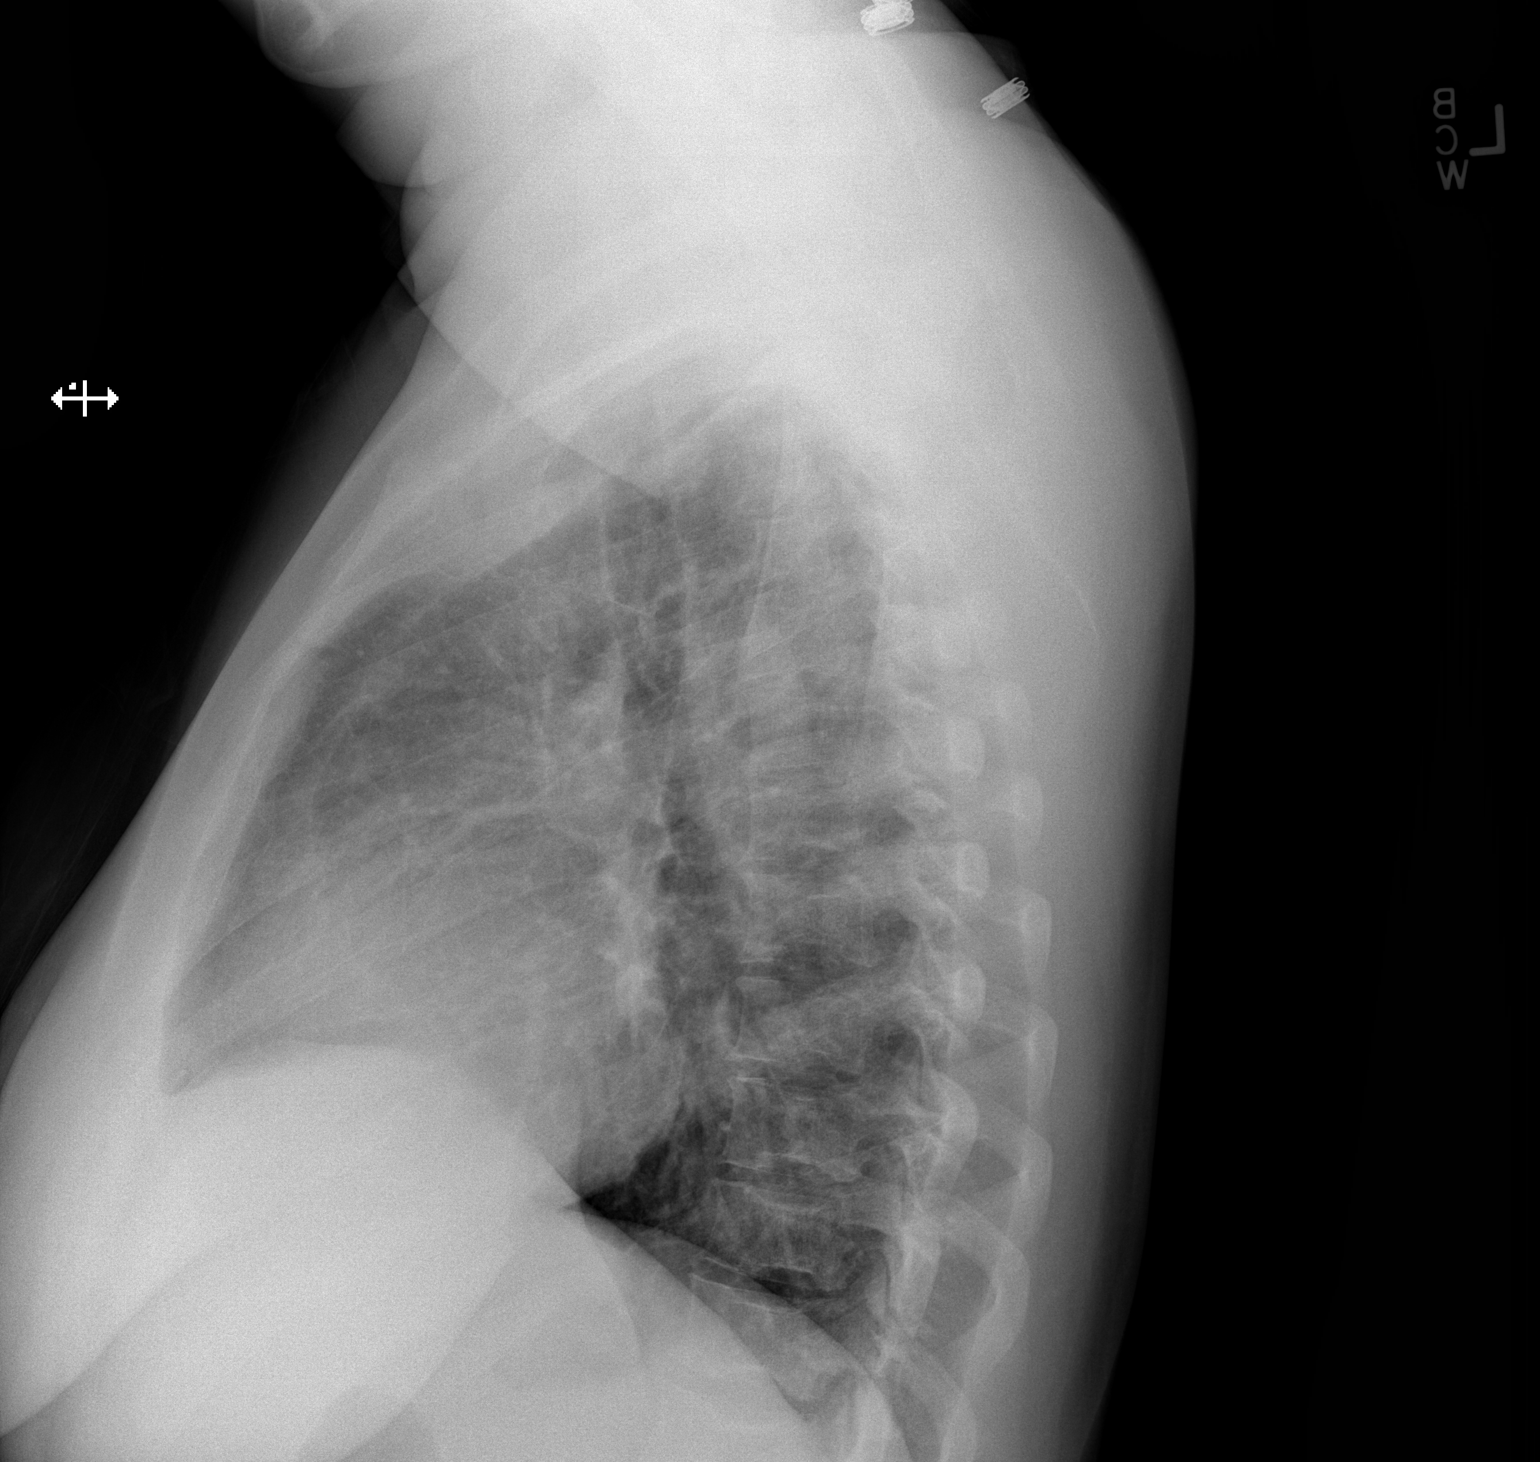

[2 of 2 positions shown; findings below may reference images not displayed]

FINDINGS: Heart size is normal.  There is vascular plethora without
edema.  There is mild perihilar pulmonary scarring as seen
previously.  Chronic sclerotic changes of the humeral heads and
chronic spinal changes of sickle cell remain evident.
IMPRESSION: Pulmonary vascular plethora without edema.  Pulmonary scarring.
Otherwise no active process.  Chronic bony changes.

## 2013-07-15 ENCOUNTER — Telehealth: Payer: Self-pay | Admitting: Internal Medicine

## 2013-07-16 ENCOUNTER — Emergency Department (HOSPITAL_COMMUNITY): Payer: Medicare Other

## 2013-07-16 ENCOUNTER — Encounter (HOSPITAL_COMMUNITY): Payer: Self-pay | Admitting: Emergency Medicine

## 2013-07-16 ENCOUNTER — Non-Acute Institutional Stay (HOSPITAL_COMMUNITY)
Admission: EM | Admit: 2013-07-16 | Discharge: 2013-07-16 | Disposition: A | Discharge status: Home / Self Care | Payer: Medicare Other | Attending: Emergency Medicine | Admitting: Emergency Medicine

## 2013-07-16 DIAGNOSIS — Z79899 Other long term (current) drug therapy: Secondary | ICD-10-CM | POA: Diagnosis not present

## 2013-07-16 DIAGNOSIS — M25579 Pain in unspecified ankle and joints of unspecified foot: Secondary | ICD-10-CM | POA: Insufficient documentation

## 2013-07-16 DIAGNOSIS — D5700 Hb-SS disease with crisis, unspecified: Secondary | ICD-10-CM

## 2013-07-16 DIAGNOSIS — S99919A Unspecified injury of unspecified ankle, initial encounter: Secondary | ICD-10-CM | POA: Diagnosis not present

## 2013-07-16 DIAGNOSIS — D571 Sickle-cell disease without crisis: Secondary | ICD-10-CM | POA: Diagnosis not present

## 2013-07-16 DIAGNOSIS — S8990XA Unspecified injury of unspecified lower leg, initial encounter: Secondary | ICD-10-CM | POA: Diagnosis not present

## 2013-07-16 DIAGNOSIS — M79609 Pain in unspecified limb: Secondary | ICD-10-CM | POA: Insufficient documentation

## 2013-07-16 DIAGNOSIS — M7989 Other specified soft tissue disorders: Secondary | ICD-10-CM | POA: Diagnosis not present

## 2013-07-16 LAB — BASIC METABOLIC PANEL
BUN: 6 mg/dL (ref 6–23)
CALCIUM: 9.2 mg/dL (ref 8.4–10.5)
CO2: 22 mEq/L (ref 19–32)
Chloride: 102 mEq/L (ref 96–112)
Creatinine, Ser: 0.57 mg/dL (ref 0.50–1.10)
Glucose, Bld: 92 mg/dL (ref 70–99)
POTASSIUM: 3.9 meq/L (ref 3.7–5.3)
SODIUM: 139 meq/L (ref 137–147)

## 2013-07-16 LAB — CBC WITH DIFFERENTIAL/PLATELET
BASOS PCT: 0 % (ref 0–1)
Basophils Absolute: 0.1 10*3/uL (ref 0.0–0.1)
EOS ABS: 0.3 10*3/uL (ref 0.0–0.7)
EOS PCT: 1 % (ref 0–5)
HCT: 26.1 % — ABNORMAL LOW (ref 36.0–46.0)
Hemoglobin: 9.3 g/dL — ABNORMAL LOW (ref 12.0–15.0)
Lymphocytes Relative: 15 % (ref 12–46)
Lymphs Abs: 2.8 10*3/uL (ref 0.7–4.0)
MCH: 32.4 pg (ref 26.0–34.0)
MCHC: 35.6 g/dL (ref 30.0–36.0)
MCV: 90.9 fL (ref 78.0–100.0)
Monocytes Absolute: 2.4 10*3/uL — ABNORMAL HIGH (ref 0.1–1.0)
Monocytes Relative: 13 % — ABNORMAL HIGH (ref 3–12)
NEUTROS PCT: 70 % (ref 43–77)
Neutro Abs: 12.8 10*3/uL — ABNORMAL HIGH (ref 1.7–7.7)
PLATELETS: 565 10*3/uL — AB (ref 150–400)
RBC: 2.87 MIL/uL — AB (ref 3.87–5.11)
RDW: 17.7 % — ABNORMAL HIGH (ref 11.5–15.5)
WBC: 18.3 10*3/uL — ABNORMAL HIGH (ref 4.0–10.5)

## 2013-07-16 LAB — RETICULOCYTES
RBC.: 2.87 MIL/uL — ABNORMAL LOW (ref 3.87–5.11)
RETIC COUNT ABSOLUTE: 545.3 10*3/uL — AB (ref 19.0–186.0)
Retic Ct Pct: 19 % — ABNORMAL HIGH (ref 0.4–3.1)

## 2013-07-16 MED ORDER — SODIUM CHLORIDE 0.9 % IV BOLUS (SEPSIS)
1000.0000 mL | INTRAVENOUS | Status: AC
Start: 2013-07-16 — End: 2013-07-16
  Administered 2013-07-16: 1000 mL via INTRAVENOUS

## 2013-07-16 MED ORDER — HYDROMORPHONE HCL PF 2 MG/ML IJ SOLN
2.0000 mg | Freq: Once | INTRAMUSCULAR | Status: AC
  Administered 2013-07-16: 2 mg via INTRAVENOUS
  Filled 2013-07-16: qty 1

## 2013-07-16 MED ORDER — HYDROMORPHONE HCL PF 2 MG/ML IJ SOLN
2.0000 mg | Freq: Once | INTRAMUSCULAR | Status: AC
Start: 2013-07-16 — End: 2013-07-16
  Administered 2013-07-16: 2 mg via INTRAVENOUS
  Filled 2013-07-16: qty 1

## 2013-07-16 MED ORDER — NALOXONE HCL 0.4 MG/ML IJ SOLN: 0.4000 mg | INTRAMUSCULAR | Status: DC | PRN

## 2013-07-16 MED ORDER — ONDANSETRON HCL 4 MG/2ML IJ SOLN
4.0000 mg | Freq: Once | INTRAMUSCULAR | Status: AC
  Administered 2013-07-16: 4 mg via INTRAVENOUS
  Filled 2013-07-16: qty 2

## 2013-07-16 MED ORDER — DIPHENHYDRAMINE HCL 25 MG PO CAPS: 25.0000 mg | ORAL_CAPSULE | Freq: Four times a day (QID) | ORAL | Status: DC | PRN

## 2013-07-16 MED ORDER — SODIUM CHLORIDE 0.9 % IJ SOLN: 9.0000 mL | INTRAMUSCULAR | Status: DC | PRN

## 2013-07-16 MED ORDER — FOLIC ACID 1 MG PO TABS
1.0000 mg | ORAL_TABLET | Freq: Every day | ORAL | Status: DC
  Administered 2013-07-16: 1 mg via ORAL
  Filled 2013-07-16: qty 1

## 2013-07-16 MED ORDER — HYDROMORPHONE 0.3 MG/ML IV SOLN
INTRAVENOUS | Status: DC
  Administered 2013-07-16 (×3): via INTRAVENOUS
  Administered 2013-07-16: 16.69 mg via INTRAVENOUS
  Filled 2013-07-16 (×3): qty 25

## 2013-07-16 MED ORDER — KETOROLAC TROMETHAMINE 15 MG/ML IJ SOLN
30.0000 mg | Freq: Four times a day (QID) | INTRAMUSCULAR | Status: DC
Start: 2013-07-16 — End: 2013-07-16
  Administered 2013-07-16: 30 mg via INTRAVENOUS
  Filled 2013-07-16 (×3): qty 2

## 2013-07-16 MED ORDER — OXYCODONE HCL 5 MG PO TABS
10.0000 mg | ORAL_TABLET | Freq: Once | ORAL | Status: AC
  Administered 2013-07-16: 10 mg via ORAL
  Filled 2013-07-16: qty 2

## 2013-07-16 MED ORDER — DEXTROSE-NACL 5-0.45 % IV SOLN
INTRAVENOUS | Status: DC
  Administered 2013-07-16: 09:00:00 via INTRAVENOUS

## 2013-07-16 MED ORDER — DIPHENHYDRAMINE HCL 50 MG/ML IJ SOLN
25.0000 mg | Freq: Once | INTRAMUSCULAR | Status: AC
  Administered 2013-07-16: 25 mg via INTRAVENOUS
  Filled 2013-07-16: qty 1

## 2013-07-16 MED ORDER — SODIUM CHLORIDE 0.9 % IV SOLN: 50.0000 mg | INTRAVENOUS | Status: DC | PRN

## 2013-07-16 MED ORDER — ONDANSETRON HCL 4 MG/2ML IJ SOLN: 4.0000 mg | Freq: Four times a day (QID) | INTRAMUSCULAR | Status: DC | PRN

## 2013-07-16 NOTE — Discharge Summary (Signed)
Physician Discharge Summary  [name] WUJ:[id number] DOB: [birth year] DOA: 07/16/2013  PCP: MATTHEWS,MICHELLE A., MD  Admit date: 07/16/2013 Discharge date: 07/16/2013  Discharge Diagnoses:  Sickle cell anemia with pain  Discharge Condition: Sickle Cell Anemia with pain  Disposition:  Follow-up Information   Follow up with MATTHEWS,MICHELLE A., MD. (As needed)    Specialty:  Internal Medicine   Contact information:   [place] [address] [phone]       Diet:General Wt Readings from Last 3 Encounters:  06/28/13 174 lb 9.6 oz (79.198 kg)  06/19/13 182 lb (82.555 kg)  05/28/13 183 lb 10.3 oz (83.3 kg)    History of present illness:  23 y.o. Female presents with right lower extremity pain which began on 07/15/2013. Maintains that she sustained  fall around noon yesterday and she injured her right ankle. She denies hitting her head or loss of consciousness. She states that she was able to walk on the ankle throughout the day. She notes that after coming home from [place] yesterday evening she began having right lower extremity pain. She states that pain is consistent with a sickle cell pain crisis. She denies any fevers, shortness of breath, chest pain, vomiting, diarrhea, or other associated symptoms. Will get screening labwork and pain control. Will get screening imaging of unlikely for fracture.     Hospital Course:  1. Right ankle pain: Screening labs and image to rule out fracture in ED  2. Sickle cell anemia with pain: Pt was treated with IVF, Toradol and IV analgesics.  Patient was treated with weight based PCA. Pain has decreased from 9/10 to 6/10. Patient is functional, able to bear weight to right lower extremity. Kpad applied prn during extended stay. Pt was initially treated with weight based rapid re-dosing and then transitioned to a weight based PCA Dilaudid. She has total use of 16.69 with 21 demands and  20 deliveries.   States that  she feels that she can manage pain at home. Patient to go home on previously prescribed medication regimen, continue to apply warm, moist compresses, elevate extremity to heart level while at rest. Patient expressed understanding.   Discharge Exam:  Filed Vitals:   07/16/13 1617  BP:   Pulse:   Temp:   Resp: 10   Filed Vitals:   07/16/13 1409 07/16/13 1412 07/16/13 1500 07/16/13 1617  BP: 120/58  130/76   Pulse: 123 99 115   Temp: 98.9 F (37.2 C)  98.8 F (37.1 C)   TempSrc: Oral  Oral   Resp: 16 10 16 10   SpO2: 97% 98% 95% 95%   General appearance: alert, cooperative and appears stated age Eyes: negative, conjunctivae/corneas clear. PERRL, EOM's intact. Fundi benign. Neck: no adenopathy, no carotid bruit, no JVD, supple, symmetrical, trachea midline and thyroid not enlarged, symmetric, no tenderness/mass/nodules Back: symmetric, no curvature. ROM normal. No CVA tenderness. Lungs: clear to auscultation bilaterally Abdomen: soft, non-tender; bowel sounds normal; no masses,  no organomegaly Extremities: extremities normal, mildly tender to palpation, atraumatic, no cyanosis or edema Neurologic: Alert and oriented X 3, normal strength and tone. Normal symmetric reflexes. Normal coordination and gait Discharge Instructions   Future Appointments Provider Department Dept Phone   07/19/2013 2:00 PM Dorena Dew, [place] [phone]       Medication List    ASK your doctor about these medications       folic acid 440 MCG tablet  Commonly known as:  FOLVITE  Take 2 tablets (1,600 mcg total) by mouth every morning.     hydroxyurea 500 MG capsule  Commonly known as:  HYDREA  Take 1,000 mg by mouth daily. May take with food to minimize GI side effects.     methadone 5 MG tablet  Commonly known as:  DOLOPHINE  Take 1 tablet (5 mg total) by mouth at bedtime.     Oxycodone HCl 10 MG Tabs  Take 15 mg by mouth every 6 (six) hours as needed (pain).           The results of significant diagnostics from this hospitalization (including imaging, microbiology, ancillary and laboratory) are listed below for reference.    Significant Diagnostic Studies: Dg Ankle Complete Right  07/16/2013   CLINICAL DATA:  Foot pain after trauma  EXAM: RIGHT ANKLE - COMPLETE 3+ VIEW  COMPARISON:  None.  FINDINGS: No evidence of ankle fracture or malalignment. There is slight irregularity at the base of the fifth metatarsal, indeterminate. No joint narrowing. Cannot exclude an ankle effusion.  IMPRESSION: 1. Negative for ankle fracture malalignment. 2. Subtle irregularity of the fifth metatarsal base. If focal tenderness in this location, recommend dedicated foot radiography to evaluate for avulsion fracture.   Electronically Signed   By: Jorje Guild M.D.   On: 07/16/2013 03:38   Dg Foot Complete Right  07/16/2013   CLINICAL DATA:  Foot pain.  Difficult exam.  EXAM: RIGHT FOOT COMPLETE - 3+ VIEW  COMPARISON:  None.  FINDINGS: There is mild soft tissue swelling anterior to the ankle. No acute fracture or malalignment. No significant or focal joint narrowing.  IMPRESSION: No acute osseous abnormality.   Electronically Signed   By: Jorje Guild M.D.   On: 07/16/2013 04:31    Microbiology: No results found for this or any previous visit (from the past 240 hour(s)).   Labs: Basic Metabolic Panel:  Recent Labs Lab 07/16/13 0405  NA 139  K 3.9  CL 102  CO2 22  GLUCOSE 92  BUN 6  CREATININE 0.57  CALCIUM 9.2   Liver Function Tests: No results found for this basename: AST, ALT, ALKPHOS, BILITOT, PROT, ALBUMIN,  in the last 168 hours No results found for this basename: LIPASE, AMYLASE,  in the last 168 hours No results found for this basename: AMMONIA,  in the last 168 hours CBC:  Recent Labs Lab 07/16/13 0405  WBC 18.3*  NEUTROABS 12.8*  HGB 9.3*  HCT 26.1*  MCV 90.9  PLT 565*   Cardiac Enzymes: No results found for this basename: CKTOTAL,  CKMB, CKMBINDEX, TROPONINI,  in the last 168 hours BNP: No components found with this basename: POCBNP,  CBG: No results found for this basename: GLUCAP,  in the last 168 hours Ferritin: No results found for this basename: FERRITIN,  in the last 168 hours  Time coordinating discharge: Greater than 30 minutes  Signed:  Seth Higginbotham M  07/16/2013, 5:25 PM

## 2013-07-16 NOTE — Progress Notes (Signed)
Pt discharged to home with friend. Discharged instructions given, copy given, pt verbalized understanding. PIV discontinued, dry gauze applied. Pt denies any further needs at this time.

## 2013-07-16 NOTE — ED Notes (Signed)
Pt presents with c/o of foot pain, states she was play fighting when she go injured on her right ankle. Mild inflammation noted, pt reports hx of sickle cell, states she feels like the foot pain is causing an flare up of the sickle cell pain.

## 2013-07-16 NOTE — Discharge Instructions (Signed)
Sickle Cell Anemia, Adult °Sickle cell anemia is a condition where your red blood cells are shaped like sickles. Red blood cells carry oxygen through the body. Sickle-shaped red blood cells cells do not live as long as normal red blood cells. They also clump together and block blood from flowing through the blood vessels. These things prevent the body from getting enough oxygen. Sickle cell anemia causes organ damage and pain. It also increases the risk of infection. °HOME CARE °· Drink enough fluid to keep your pee (urine) clear or pale yellow. Drink more in hot weather and during exercise. °· Do not smoke. Smoking lowers oxygen levels in the blood. °· Only take over-the-counter or prescription medicines as told by your doctor. °· Take antibiotic medicines as told by your doctor. Make sure you finish them it even if you start to feel better. °· Take supplements as told by your doctor. °· Consider wearing a medical alert bracelet. This tells anyone caring for you in an emergency of your condition. °· When traveling, keep your medical information, doctors' names, and the medicines you take with you at all times. °· If you have a fever, do not take fever medicines right away. This could cover up a problem. Tell your doctor. °·  Keep all follow-up appointments with your doctor. Sickle cell anemia requires regular medical care. °GET HELP IF: °You have a fever. °GET HELP RIGHT AWAY IF: °· You feel dizzy or faint. °· You have new belly (abdominal) pain, especially on the left side near the stomach area. °· You have a lasting, often uncomfortable and painful erection of the penis (priapism). If it is not treated right away, you will become unable to have sex (impotence). °· You have numbness your arms or legs or you have a hard time moving them. °· You have a hard time talking. °· You have a fever or lasting symptoms for more than 2 3 days. °· You have a fever and your symptoms suddenly get worse. °· You have signs or  symptoms of infection. These include: °· Chills. °· Being more tired than normal (lethargy). °· Irritability. °· Poor eating. °· Throwing up (vomiting). °· You have pain that is not helped with medicine. °· You have shortness of breath. °· You have pain in your chest. °· You are coughing up pus-like or bloody mucus. °· You have a stiff neck. °· Your feet or hands swell or have pain. °· Your belly looks bloated. °· Your joints hurt. °MAKE SURE YOU: °· Understand these instructions. °· Will watch your condition. °· Will get help right away if you are not doing well or get worse. °Document Released: 02/20/2013 Document Reviewed: 12/12/2012 °[employer]® Patient Information ©2014 [employer]. ° °

## 2013-07-16 NOTE — ED Provider Notes (Signed)
CSN: [id number]     Arrival date & time 07/16/13  0228 History   First MD Initiated Contact with Patient 07/16/13 0320     Chief Complaint  Patient presents with  . Foot Pain     (Consider location/radiation/quality/duration/timing/severity/associated sxs/prior Treatment) Patient is a 23 y.o. female presenting with fall and sickle cell pain. The history is provided by the patient.  Fall This is a new problem. The current episode started 12 to 24 hours ago. Episode frequency: once. Pertinent negatives include no chest pain, no abdominal pain, no headaches and no shortness of breath.  Sickle Cell Pain Crisis Pain location: right leg. Severity:  Moderate Onset quality:  Sudden Duration:  4 hours Similar to previous crisis episodes: yes   Timing:  Constant Progression:  Unchanged Chronicity:  Recurrent Context comment:  [place] today and hurt RLE Relieved by:  Nothing Worsened by:  Nothing tried Ineffective treatments: narcotics at home. Associated symptoms: no chest pain, no congestion, no cough, no fatigue, no fever, no headaches, no nausea, no shortness of breath and no vomiting     Past Medical History  Diagnosis Date  . H/O: 1 miscarriage 03/22/2011  . 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 Depression 01/06/2011  . GERD (gastroesophageal reflux disease) 02/17/2011  . Trichotillomania     h/o  . Blood transfusion     "lots"  . Sickle cell anemia with crisis   . Exertional dyspnea     "sometimes"  . Sickle cell anemia   . Headache(784.0)   . Migraines 11/08/11    "@ least twice/month"  . Chronic back pain     "very severe; have knot in my back; from tight muscle; take RX and exercise for it"  . Mood swings 11/08/11    "I go back and forth; real bad"  . Pregnancy   . Blood dyscrasia     SICKLE CELL   Past Surgical History  Procedure Laterality Date  . Cholecystectomy  05/2010  . Dilation and curettage of uterus  02/20/11    S/P miscarriage   Family History  Problem Relation Age  of Onset  . Diabetes Mother   . Alcoholism    . Depression    . Hypercholesterolemia    . Hypertension    . Migraines    . Diabetes Maternal Grandmother   . Diabetes Paternal Grandmother    History  Substance Use Topics  . Smoking status: Current Some Day Smoker -- 0.25 packs/day for 1 years    Types: Cigarettes    Last Attempt to Quit: 03/25/2013  . Smokeless tobacco: Never Used  . Alcohol Use: Yes     Comment: pt states she quit marijuan in May 2013. Rare ETOH, + cigarettes.  She is enrolled in school   OB History   Grav Para Term Preterm Abortions TAB SAB Ect Mult Living   2    1  1         Obstetric Comments   Miscarried in October 2012 at about 7 weeks     Review of Systems  Constitutional: Negative for fever and fatigue.  HENT: Negative for congestion and drooling.   Eyes: Negative for pain.  Respiratory: Negative for cough and shortness of breath.   Cardiovascular: Negative for chest pain.  Gastrointestinal: Negative for nausea, vomiting, abdominal pain and diarrhea.  Genitourinary: Negative for dysuria and hematuria.  Musculoskeletal: Negative for back pain, gait problem and neck pain.  Skin: Negative for color change.  Neurological: Negative for dizziness and headaches.  Hematological: Negative for adenopathy.  Psychiatric/Behavioral: Negative for behavioral problems.  All other systems reviewed and are negative.      Allergies  Carrot oil and Latex  Home Medications   Current Outpatient Rx  Name  Route  Sig  Dispense  Refill  . folic acid (FOLVITE) 952 MCG tablet   Oral   Take 2 tablets (1,600 mcg total) by mouth every morning.         . hydroxyurea (HYDREA) 500 MG capsule   Oral   Take 1,000 mg by mouth daily. May take with food to minimize GI side effects.         . methadone (DOLOPHINE) 5 MG tablet   Oral   Take 1 tablet (5 mg total) by mouth at bedtime.   30 tablet   0   . Oxycodone HCl 10 MG TABS   Oral   Take 15 mg by mouth every  6 (six) hours as needed (pain).          BP 114/82  Pulse 107  Temp(Src) 97.8 F (36.6 C) (Oral)  Resp 22  SpO2 100%  LMP 07/07/2013 Physical Exam  Nursing note and vitals reviewed. Constitutional: She is oriented to person, place, and time. She appears well-developed and well-nourished.  HENT:  Head: Normocephalic.  Mouth/Throat: Oropharynx is clear and moist. No oropharyngeal exudate.  Eyes: Conjunctivae and EOM are normal. Pupils are equal, round, and reactive to light.  Neck: Normal range of motion. Neck supple.  Cardiovascular: Normal rate, regular rhythm, normal heart sounds and intact distal pulses.  Exam reveals no gallop and no friction rub.   No murmur heard. Pulmonary/Chest: Effort normal and breath sounds normal. No respiratory distress. She has no wheezes.  Abdominal: Soft. Bowel sounds are normal. There is no tenderness. There is no rebound and no guarding.  Musculoskeletal: Normal range of motion. She exhibits tenderness (diffuse tenderness of the right ankle which does not seem to localize. No other focal tenderness noted.). She exhibits no edema.  2+ distal pulses in bilateral lower extremities.  Neurological: She is alert and oriented to person, place, and time.  Skin: Skin is warm and dry.  Psychiatric: She has a normal mood and affect. Her behavior is normal.    ED Course  Procedures (including critical care time) Labs Review Labs Reviewed  CBC WITH DIFFERENTIAL - Abnormal; Notable for the following:    WBC 18.3 (*)    RBC 2.87 (*)    Hemoglobin 9.3 (*)    HCT 26.1 (*)    RDW 17.7 (*)    Platelets 565 (*)    Neutro Abs 12.8 (*)    Monocytes Relative 13 (*)    Monocytes Absolute 2.4 (*)    All other components within normal limits  RETICULOCYTES - Abnormal; Notable for the following:    Retic Ct Pct 19.0 (*)    RBC. 2.87 (*)    Retic Count, Manual 545.3 (*)    All other components within normal limits  BASIC METABOLIC PANEL   Imaging Review Dg  Ankle Complete Right  07/16/2013   CLINICAL DATA:  Foot pain after trauma  EXAM: RIGHT ANKLE - COMPLETE 3+ VIEW  COMPARISON:  None.  FINDINGS: No evidence of ankle fracture or malalignment. There is slight irregularity at the base of the fifth metatarsal, indeterminate. No joint narrowing. Cannot exclude an ankle effusion.  IMPRESSION: 1. Negative for ankle fracture malalignment. 2. Subtle irregularity of the fifth metatarsal base. If focal tenderness in  this location, recommend dedicated foot radiography to evaluate for avulsion fracture.   Electronically Signed   By: Jorje Guild M.D.   On: 07/16/2013 03:38     EKG Interpretation None      MDM   Final diagnoses:  Sickle cell pain crisis    3:42 AM 23 y.o. female who presents with right lower extremity pain which began this evening. She states that she had a mechanical fall around noon yesterday and believes she may have her right ankle at that time. She denies hitting her head or loss of consciousness. She states that she was able to walk on the ankle throughout the day. She notes that after coming home from [place] yesterday evening she began having right lower extremity pain. She states that it feels like a sickle cell pain crisis. She denies any fevers, shortness of breath, chest pain, vomiting, diarrhea, or other associated symptoms. She is afebrile and mildly tachycardic here. Will get screening labwork and pain control. Will get screening imaging, low suspicion for fracture given pt was ambulatory w/ return of pain later.   Pt got multiple doses of dilaudid here. Pt's labs discussed w/ her pcp Dr. Zigmund Daniel. Pt would prefer to not be admitted. Dr. Zigmund Daniel ok w/ pt coming to sickle cell pain clinic.  I have discussed the diagnosis/risks/treatment options with the patient and believe the pt to be eligible for discharge home to follow-up with pcp as needed. We also discussed returning to the ED immediately if new or worsening sx occur. We  discussed the sx which are most concerning (e.g., worsening pain, fever, sob, cp) that necessitate immediate return. Medications administered to the patient during their visit and any new prescriptions provided to the patient are listed below.  Medications given during this visit Medications  folic acid (FOLVITE) tablet 1 mg (1 mg Oral Given 07/16/13 0920)  diphenhydrAMINE (BENADRYL) capsule 25-50 mg (not administered)    Or  diphenhydrAMINE (BENADRYL) 50 mg in sodium chloride 0.9 % 50 mL IVPB (not administered)  ketorolac (TORADOL) 15 MG/ML injection 30 mg (30 mg Intravenous Given 07/16/13 1438)  sodium chloride 0.9 % bolus 1,000 mL (0 mLs Intravenous Stopped 07/16/13 0630)  HYDROmorphone (DILAUDID) injection 2 mg (2 mg Intravenous Given 07/16/13 0416)  diphenhydrAMINE (BENADRYL) injection 25 mg (25 mg Intravenous Given 07/16/13 0417)  ondansetron (ZOFRAN) injection 4 mg (4 mg Intravenous Given 07/16/13 0416)  HYDROmorphone (DILAUDID) injection 2 mg (2 mg Intravenous Given 07/16/13 0456)  HYDROmorphone (DILAUDID) injection 2 mg (2 mg Intravenous Given 07/16/13 0626)  HYDROmorphone (DILAUDID) injection 2 mg (2 mg Intravenous Given 07/16/13 0756)  HYDROmorphone (DILAUDID) injection 2 mg (2 mg Intravenous Given 07/16/13 0735)  oxyCODONE (Oxy IR/ROXICODONE) immediate release tablet 10 mg (10 mg Oral Given 07/16/13 1748)    Current Discharge Medication List         Blanchard Kelch, MD 07/16/13 2001

## 2013-07-18 ENCOUNTER — Other Ambulatory Visit: Payer: Self-pay | Admitting: Family Medicine

## 2013-07-18 MED ORDER — OXYCODONE HCL 10 MG PO TABS: 15.0000 mg | ORAL_TABLET | Freq: Four times a day (QID) | ORAL | Status: DC | PRN

## 2013-07-18 NOTE — Telephone Encounter (Signed)
Oxycodone 10 mg # 90 tablets prescribed. Appointment on 07/19/2013

## 2013-07-19 ENCOUNTER — Encounter: Payer: Self-pay | Admitting: Family Medicine

## 2013-07-19 ENCOUNTER — Ambulatory Visit (INDEPENDENT_AMBULATORY_CARE_PROVIDER_SITE_OTHER): Payer: Medicare Other | Admitting: Family Medicine

## 2013-07-19 VITALS — BP 114/66 | HR 94 | Temp 98.6°F | Resp 20 | Wt 178.0 lb

## 2013-07-19 DIAGNOSIS — F3289 Other specified depressive episodes: Secondary | ICD-10-CM

## 2013-07-19 DIAGNOSIS — G8929 Other chronic pain: Secondary | ICD-10-CM | POA: Diagnosis not present

## 2013-07-19 DIAGNOSIS — F32A Depression, unspecified: Secondary | ICD-10-CM

## 2013-07-19 DIAGNOSIS — D571 Sickle-cell disease without crisis: Secondary | ICD-10-CM | POA: Diagnosis not present

## 2013-07-19 DIAGNOSIS — F329 Major depressive disorder, single episode, unspecified: Secondary | ICD-10-CM | POA: Diagnosis not present

## 2013-07-19 DIAGNOSIS — F17200 Nicotine dependence, unspecified, uncomplicated: Secondary | ICD-10-CM

## 2013-07-19 NOTE — Progress Notes (Signed)
   Subjective:    Patient ID: [name], female    DOB: 1991/01/28, 23 y.o.   MRN: [id number]  HPI  Patient in office following up from previous hospital visit. Maintains that pain has improved since day hospital extended stay.  Patient reports that she is currently having 6/10 pain in left ankle, relieved by medications and rest.  Review of Systems  Eyes: Negative.   Cardiovascular: Negative.   Gastrointestinal: Negative.   Endocrine: Negative.   Genitourinary: Negative.   Musculoskeletal: Positive for back pain and myalgias.  Allergic/Immunologic: Negative.   Neurological: Negative.   Psychiatric/Behavioral: The patient is nervous/anxious.        Objective:   Physical Exam  Constitutional: She is oriented to person, place, and time. She appears well-developed and well-nourished.  HENT:  Head: Normocephalic and atraumatic.  Eyes: Conjunctivae and EOM are normal. Pupils are equal, round, and reactive to light.  Neck: Normal range of motion. Neck supple.  Cardiovascular: Normal rate and regular rhythm.   Pulmonary/Chest: Effort normal and breath sounds normal.  Abdominal: Soft. Bowel sounds are normal.  Musculoskeletal: She exhibits tenderness.       Right ankle: She exhibits decreased range of motion. Tenderness.  Neurological: She is alert and oriented to person, place, and time.  Skin: Skin is warm and dry.          Assessment & Plan:  1. Sickle Cell anemia HB SS: Patient states that she has been consistent with taking Hydrea and folic acid. We discussed the need for good hydration, monitoring of hydration status, avoidance of heat, cold, stress, and infection triggers. We discussed the risks and benefits of Hydrea, including bone marrow suppression, the possibility of GI upset, skin ulcers, hair thinning, and teratogenicity. The patient was reminded of the need to seek medical attention of any symptoms of bleeding, anemia, or infection. Continue folic acid 1 mg  daily to prevent aplastic bone marrow crises. d. Reports that her pain is well controlled on prescribed pain medications. Reviewed labs from 07/16/2013. Refilled Oxycodone 15 mg every 6 hours as needed #90.   2. Menorrhagia: Patient wants to start oral contraception, she is not interested in depo-provera. Patient is requesting Cyclessa, which work well in the past. Discussed side effects of oral contraceptives and given written information. Patient to return on 08/01/2013. Will discuss option with Dr. Zigmund Daniel  3. Depression: Patient states that her depression has improved on current medication regimen. Reports that she has been spending time talking to friends with sickle cell disease, which is helping a great deal  4. Tobacco use disorder: Patient remains in a contemplative state. Patient reports that she mostly smokes when around others that smoke. Feels as though she can quit on her own. Discussed smoking as it relates to sickle cell disease. Patient expressed understanding.   RTC: 08/01/2013

## 2013-07-22 ENCOUNTER — Telehealth: Payer: Self-pay | Admitting: Internal Medicine

## 2013-07-22 NOTE — Telephone Encounter (Signed)
Called patient regarding group therapy. Phone not in service. Unable to leave a message.

## 2013-07-29 ENCOUNTER — Telehealth: Payer: Self-pay | Admitting: Internal Medicine

## 2013-07-29 NOTE — Telephone Encounter (Signed)
Called to schedule appointment for assessment for group therapy. Disconnected telephone number.

## 2013-07-30 ENCOUNTER — Telehealth (HOSPITAL_COMMUNITY): Payer: Self-pay | Admitting: Hematology

## 2013-07-30 ENCOUNTER — Emergency Department (HOSPITAL_COMMUNITY): Payer: Medicare Other

## 2013-07-30 ENCOUNTER — Encounter (HOSPITAL_COMMUNITY): Payer: Self-pay | Admitting: Emergency Medicine

## 2013-07-30 ENCOUNTER — Inpatient Hospital Stay (HOSPITAL_COMMUNITY)
Admission: EM | Admit: 2013-07-30 | Discharge: 2013-08-04 | DRG: 812 | Disposition: A | Discharge status: Home / Self Care | Payer: Medicare Other | Attending: Family Medicine | Admitting: Family Medicine

## 2013-07-30 DIAGNOSIS — G8929 Other chronic pain: Secondary | ICD-10-CM | POA: Diagnosis not present

## 2013-07-30 DIAGNOSIS — F6389 Other impulse disorders: Secondary | ICD-10-CM

## 2013-07-30 DIAGNOSIS — D473 Essential (hemorrhagic) thrombocythemia: Secondary | ICD-10-CM | POA: Diagnosis present

## 2013-07-30 DIAGNOSIS — R509 Fever, unspecified: Secondary | ICD-10-CM

## 2013-07-30 DIAGNOSIS — D649 Anemia, unspecified: Secondary | ICD-10-CM | POA: Diagnosis not present

## 2013-07-30 DIAGNOSIS — R000 Tachycardia, unspecified: Secondary | ICD-10-CM | POA: Diagnosis present

## 2013-07-30 DIAGNOSIS — F32A Depression, unspecified: Secondary | ICD-10-CM

## 2013-07-30 DIAGNOSIS — Z833 Family history of diabetes mellitus: Secondary | ICD-10-CM | POA: Diagnosis not present

## 2013-07-30 DIAGNOSIS — F329 Major depressive disorder, single episode, unspecified: Secondary | ICD-10-CM | POA: Diagnosis not present

## 2013-07-30 DIAGNOSIS — F3289 Other specified depressive episodes: Secondary | ICD-10-CM | POA: Diagnosis present

## 2013-07-30 DIAGNOSIS — E663 Overweight: Secondary | ICD-10-CM

## 2013-07-30 DIAGNOSIS — M79651 Pain in right thigh: Secondary | ICD-10-CM

## 2013-07-30 DIAGNOSIS — E876 Hypokalemia: Secondary | ICD-10-CM | POA: Diagnosis not present

## 2013-07-30 DIAGNOSIS — J020 Streptococcal pharyngitis: Secondary | ICD-10-CM

## 2013-07-30 DIAGNOSIS — Z30011 Encounter for initial prescription of contraceptive pills: Secondary | ICD-10-CM

## 2013-07-30 DIAGNOSIS — K219 Gastro-esophageal reflux disease without esophagitis: Secondary | ICD-10-CM

## 2013-07-30 DIAGNOSIS — M79606 Pain in leg, unspecified: Secondary | ICD-10-CM

## 2013-07-30 DIAGNOSIS — D72829 Elevated white blood cell count, unspecified: Secondary | ICD-10-CM | POA: Diagnosis not present

## 2013-07-30 DIAGNOSIS — F17200 Nicotine dependence, unspecified, uncomplicated: Secondary | ICD-10-CM

## 2013-07-30 DIAGNOSIS — N898 Other specified noninflammatory disorders of vagina: Secondary | ICD-10-CM

## 2013-07-30 DIAGNOSIS — M549 Dorsalgia, unspecified: Secondary | ICD-10-CM

## 2013-07-30 DIAGNOSIS — D571 Sickle-cell disease without crisis: Secondary | ICD-10-CM

## 2013-07-30 DIAGNOSIS — M87029 Idiopathic aseptic necrosis of unspecified humerus: Secondary | ICD-10-CM

## 2013-07-30 DIAGNOSIS — R4586 Emotional lability: Secondary | ICD-10-CM

## 2013-07-30 DIAGNOSIS — Z7251 High risk heterosexual behavior: Secondary | ICD-10-CM

## 2013-07-30 DIAGNOSIS — R51 Headache: Secondary | ICD-10-CM

## 2013-07-30 DIAGNOSIS — D5700 Hb-SS disease with crisis, unspecified: Secondary | ICD-10-CM | POA: Diagnosis not present

## 2013-07-30 DIAGNOSIS — R112 Nausea with vomiting, unspecified: Secondary | ICD-10-CM

## 2013-07-30 DIAGNOSIS — F439 Reaction to severe stress, unspecified: Secondary | ICD-10-CM

## 2013-07-30 DIAGNOSIS — Z79899 Other long term (current) drug therapy: Secondary | ICD-10-CM | POA: Diagnosis not present

## 2013-07-30 DIAGNOSIS — E43 Unspecified severe protein-calorie malnutrition: Secondary | ICD-10-CM

## 2013-07-30 DIAGNOSIS — G43909 Migraine, unspecified, not intractable, without status migrainosus: Secondary | ICD-10-CM

## 2013-07-30 DIAGNOSIS — K5900 Constipation, unspecified: Secondary | ICD-10-CM

## 2013-07-30 DIAGNOSIS — E875 Hyperkalemia: Secondary | ICD-10-CM

## 2013-07-30 DIAGNOSIS — Z23 Encounter for immunization: Secondary | ICD-10-CM

## 2013-07-30 LAB — COMPREHENSIVE METABOLIC PANEL
ALT: 12 U/L (ref 0–35)
AST: 20 U/L (ref 0–37)
Albumin: 4.4 g/dL (ref 3.5–5.2)
Alkaline Phosphatase: 58 U/L (ref 39–117)
BUN: 5 mg/dL — AB (ref 6–23)
CO2: 22 meq/L (ref 19–32)
Calcium: 9.4 mg/dL (ref 8.4–10.5)
Chloride: 103 mEq/L (ref 96–112)
Creatinine, Ser: 0.58 mg/dL (ref 0.50–1.10)
GFR calc non Af Amer: >90 mL/min (ref >90)
GLUCOSE: 107 mg/dL — AB (ref 70–99)
POTASSIUM: 3.4 meq/L — AB (ref 3.7–5.3)
Sodium: 140 mEq/L (ref 137–147)
TOTAL PROTEIN: 8.1 g/dL (ref 6.0–8.3)
Total Bilirubin: 2.2 mg/dL — ABNORMAL HIGH (ref 0.3–1.2)

## 2013-07-30 LAB — CBC
HEMATOCRIT: 27.6 % — AB (ref 36.0–46.0)
HEMOGLOBIN: 9.8 g/dL — AB (ref 12.0–15.0)
MCH: 31 pg (ref 26.0–34.0)
MCHC: 35.5 g/dL (ref 30.0–36.0)
MCV: 87.3 fL (ref 78.0–100.0)
Platelets: 563 10*3/uL — ABNORMAL HIGH (ref 150–400)
RBC: 3.16 MIL/uL — AB (ref 3.87–5.11)
RDW: 16.3 % — ABNORMAL HIGH (ref 11.5–15.5)
WBC: 18.7 10*3/uL — ABNORMAL HIGH (ref 4.0–10.5)

## 2013-07-30 LAB — RETICULOCYTES
RBC.: 3.16 MIL/uL — ABNORMAL LOW (ref 3.87–5.11)
RETIC CT PCT: 15 % — AB (ref 0.4–3.1)
Retic Count, Absolute: 474 10*3/uL — ABNORMAL HIGH (ref 19.0–186.0)

## 2013-07-30 MED ORDER — DIPHENHYDRAMINE HCL 50 MG/ML IJ SOLN
25.0000 mg | Freq: Once | INTRAMUSCULAR | Status: AC
  Administered 2013-07-31: 25 mg via INTRAVENOUS
  Filled 2013-07-30 (×2): qty 1

## 2013-07-30 MED ORDER — HYDROMORPHONE HCL PF 2 MG/ML IJ SOLN
2.0000 mg | Freq: Once | INTRAMUSCULAR | Status: AC
  Administered 2013-07-31: 2 mg via INTRAVENOUS
  Filled 2013-07-30 (×2): qty 1

## 2013-07-30 MED ORDER — KETOROLAC TROMETHAMINE 30 MG/ML IJ SOLN
30.0000 mg | Freq: Once | INTRAMUSCULAR | Status: AC
  Administered 2013-07-31: 30 mg via INTRAVENOUS
  Filled 2013-07-30 (×2): qty 1

## 2013-07-30 MED ORDER — SODIUM CHLORIDE 0.9 % IV BOLUS (SEPSIS)
1000.0000 mL | Freq: Once | INTRAVENOUS | Status: AC
  Administered 2013-07-30: 1000 mL via INTRAVENOUS

## 2013-07-30 NOTE — ED Notes (Signed)
Per pt report: pt from home: Pt reports feeling weak since this morning.  Pt reports being to ambulate but walking slower than normal.  Pt feels generalized weakness.  Pt reports pain in her legs.  Pt a/o x 4 in triage.  Pt reports pain in the joints and back.  Pt reports pain 6/10.  Pt reports taking 10mg  of oxycodone earlier but it has not helped. Pt also reports drinking 64 oz of water too.  Pt endorses nausea but denies SOB, V, fever or chills.

## 2013-07-30 NOTE — ED Provider Notes (Signed)
CSN: [id number]     Arrival date & time 07/30/13  1830 History   First MD Initiated Contact with Patient 07/30/13 2056     Chief Complaint  Patient presents with  . Sickle Cell Pain Crisis     (Consider location/radiation/quality/duration/timing/severity/associated sxs/prior Treatment) HPI Pt presenting with c/o pain in bilateral legs and diffuse body pain as well.  Pt states this feels similar to her prior sickle cell crises.  No fever, no diffculty breathing.  No abdominal pain, no vomiting.  Pt states she took 10mg  oxycodone at home which did not help his symptoms.  She has also been increasing her fluid intake and drank 64 ounces of water today.  She states she called the sickle cell clinic and was not able to get in there today which is what prompted ED visit. There are no other associated systemic symptoms, there are no other alleviating or modifying factors.   Past Medical History  Diagnosis Date  . H/O: 1 miscarriage 03/22/2011    Pt reports 2 miscarriages.  [name] 01/08/2009  . Depression 01/06/2011  . GERD (gastroesophageal reflux disease) 02/17/2011  . Trichotillomania     h/o  . Blood transfusion     "lots"  . Sickle cell anemia with crisis   . Exertional dyspnea     "sometimes"  . Sickle cell anemia   . Headache(784.0)   . Migraines 11/08/11    "@ least twice/month"  . Chronic back pain     "very severe; have knot in my back; from tight muscle; take RX and exercise for it"  . Mood swings 11/08/11    "I go back and forth; real bad"  . Pregnancy   . Blood dyscrasia     SICKLE CELL   Past Surgical History  Procedure Laterality Date  . Cholecystectomy  05/2010  . Dilation and curettage of uterus  02/20/11    S/P miscarriage   Family History  Problem Relation Age of Onset  . Diabetes Mother   . Alcoholism    . Depression    . Hypercholesterolemia    . Hypertension    . Migraines    . Diabetes Maternal Grandmother   . Diabetes Paternal Grandmother     History  Substance Use Topics  . Smoking status: Current Some Day Smoker -- 0.25 packs/day for 1 years    Types: Cigarettes    Last Attempt to Quit: 03/25/2013  . Smokeless tobacco: Never Used  . Alcohol Use: Yes     Comment: pt states she quit marijuan in May 2013. Rare ETOH, + cigarettes.  She is enrolled in school   OB History   Grav Para Term Preterm Abortions TAB SAB Ect Mult Living   2    1  1         Obstetric Comments   Miscarried in October 2012 at about 7 weeks     Review of Systems ROS reviewed and all otherwise negative except for mentioned in HPI    Allergies  Carrot oil and Latex  Home Medications   No current outpatient prescriptions on file. BP 116/77  Pulse 112  Temp(Src) 99.6 F (37.6 C) (Oral)  Resp 13  Ht 5\' 2"  (1.575 m)  Wt 176 lb 3.2 oz (79.924 kg)  BMI 32.22 kg/m2  SpO2 94%  LMP 07/07/2013 Vitals reviewed Physical Exam Physical Examination: General appearance - alert, well appearing, and in no distress Mental status - alert, oriented to person, place, and time Eyes - mild  scleral icterus, no conjunctival injection Mouth - mucous membranes moist, pharynx normal without lesions Chest - clear to auscultation, no wheezes, rales or rhonchi, symmetric air entry Heart - normal rate, regular rhythm, normal S1, S2, no murmurs, rubs, clicks or gallops Abdomen - soft, nontender, nondistended, no masses or organomegaly Musculoskeletal - , diffuse ttp over muscle mass of lower extremities bilaterallly, no joint tenderness, deformity or swelling Extremities - peripheral pulses normal, no pedal edema, no clubbing or cyanosis Skin - normal coloration and turgor, no rashes  ED Course  Procedures (including critical care time)  10:36 PM pt with blood obtained but not able to place IV after multiple attempts.  Pt declines IM medication because she states the injection is painful.  She states she only wants the medication through an IV.  The IV team has  been called, but I have discussed with patient that I would like to proceed with treating her pain either IM or offered oral medication.  She states she only will take IV medication.  I have d/w her that this is her decision but if her labs are reassuring then we will be ready to proceed with discharge and I would like to try to control her pain via IM or oral route.  She acknowledges this but would not like any other route of medication from IV.   Labs Review Labs Reviewed  CBC - Abnormal; Notable for the following:    WBC 18.7 (*)    RBC 3.16 (*)    Hemoglobin 9.8 (*)    HCT 27.6 (*)    RDW 16.3 (*)    Platelets 563 (*)    All other components within normal limits  COMPREHENSIVE METABOLIC PANEL - Abnormal; Notable for the following:    Potassium 3.4 (*)    Glucose, Bld 107 (*)    BUN 5 (*)    Total Bilirubin 2.2 (*)    All other components within normal limits  RETICULOCYTES - Abnormal; Notable for the following:    Retic Ct Pct 15.0 (*)    RBC. 3.16 (*)    Retic Count, Manual 474.0 (*)    All other components within normal limits   Imaging Review Dg Chest 2 View  07/30/2013   CLINICAL DATA:  Sickle cell crisis, cough, congestion  EXAM: CHEST  2 VIEW  COMPARISON:  DG CHEST 2 VIEW dated 04/28/2013  FINDINGS: Heart size and vascular pattern are normal. There is no consolidation or effusion. There is a thin band of linear density in the right upper lobe most consistent with scarring. It is stable.  IMPRESSION: Negative for acute abnormalities.   Electronically Signed   By: Skipper Cliche M.D.   On: 07/30/2013 21:56     EKG Interpretation None      MDM   Final diagnoses:  Sickle cell anemia with pain    Pt with pain in legs and diffusely in her body.  Labs are at her baseline in terms of anemia, retic count, bilirubin.  Pt does not want IM or po meds, only IV.  We are not able to obtain IV access in this patient,  Given that her labs are reassuring pt will be discharged to f/u  in sickle cell clinic tomorrow.  She was offered IM and/or po meds multiple times.     Threasa Beards, MD 07/31/13 [phone]

## 2013-07-30 NOTE — ED Notes (Signed)
Pt refused IM pain medication,  She only wants IV,  Dr Canary Brim explained at length  That the IM  Pain medication is equivalent to IV but pt states she only wants it IV,  Then she ask for a picc line or central line,  Again Dr Canary Brim explained only that route if necessary for blood transfusion if her labs were abnormal.  Tiffany RN charge at bedside also during conversation and wasted open medication with me and returned two unopened items

## 2013-07-30 NOTE — Telephone Encounter (Signed)
Pt called stating that she was having pain to bilateral legs with weakness. States pain is a 5/10 on pain scale.  States that she took a oxycodone 10 mg this morning and again at 1pm with poor effect.  Denies fever, chest pain and, diarrhea although she does state that she is having "slight nausea and abdominal pain, but nothing too serious."  Will notify MD on call

## 2013-07-30 NOTE — Telephone Encounter (Signed)
Spoke with [name] and [place] Hollis NP, instructed to tell pt to drink 64 ounces of water an hour, take her methadone tonight, and to increase her meds from 1.5 oxycodone 10 mg tabs q6 hours to q4 hrs prn. RN called the patient back, pt did not answer.  Left message to call RN back at [phone]. Called pt again at 1804, this time the pt answered her phone. RN told her as per [name] to drink 64 ounces of water an hour, to take her methadone tonight, that she can increase her medication frequency from q 6 hours prn to q 4 hours prn, and to call the clinic in the morning if still in pain.  Told the pt to try to refrain from going to the ED unless it is absolutely necessary. Pt asked if she can just go to the ED for a few hours, RN stressed for pt to avoid the ED unless necessary.  Told pt clinic opens at 0800hrs she can call us in the am. Pt agrees.

## 2013-07-30 NOTE — ED Notes (Signed)
Pt wanting IV team to continue to try IV start.  Pt reports that she needs fluids and IV pain meds.  MD notified and MD said she could have IM but she was discharge.  Pt informed of MD decision.  Charge RN made aware.

## 2013-07-30 NOTE — ED Notes (Signed)
Attempted two IV's without success, pt tolerated well

## 2013-07-31 ENCOUNTER — Telehealth: Payer: Self-pay | Admitting: Internal Medicine

## 2013-07-31 DIAGNOSIS — D5700 Hb-SS disease with crisis, unspecified: Principal | ICD-10-CM

## 2013-07-31 DIAGNOSIS — D72829 Elevated white blood cell count, unspecified: Secondary | ICD-10-CM

## 2013-07-31 MED ORDER — FOLIC ACID 1 MG PO TABS
1.0000 mg | ORAL_TABLET | Freq: Every day | ORAL | Status: DC
  Filled 2013-07-31: qty 1

## 2013-07-31 MED ORDER — SODIUM CHLORIDE 0.9 % IJ SOLN: 9.0000 mL | INTRAMUSCULAR | Status: DC | PRN

## 2013-07-31 MED ORDER — METHADONE HCL 5 MG PO TABS
5.0000 mg | ORAL_TABLET | Freq: Every day | ORAL | Status: DC
  Administered 2013-08-01 – 2013-08-03 (×3): 5 mg via ORAL
  Filled 2013-07-31 (×4): qty 1

## 2013-07-31 MED ORDER — ONDANSETRON HCL 4 MG/2ML IJ SOLN
4.0000 mg | Freq: Once | INTRAMUSCULAR | Status: AC
  Administered 2013-07-31: 4 mg via INTRAVENOUS

## 2013-07-31 MED ORDER — KETOROLAC TROMETHAMINE 30 MG/ML IJ SOLN
30.0000 mg | Freq: Four times a day (QID) | INTRAMUSCULAR | Status: DC
Start: 2013-07-31 — End: 2013-08-01
  Administered 2013-07-31 – 2013-08-01 (×5): 30 mg via INTRAVENOUS
  Filled 2013-07-31 (×2): qty 1
  Filled 2013-07-31: qty 2
  Filled 2013-07-31 (×7): qty 1

## 2013-07-31 MED ORDER — HYDROMORPHONE HCL PF 2 MG/ML IJ SOLN
2.0000 mg | Freq: Once | INTRAMUSCULAR | Status: AC
  Administered 2013-07-31: 2 mg via INTRAVENOUS
  Filled 2013-07-31: qty 1

## 2013-07-31 MED ORDER — ONDANSETRON HCL 4 MG/2ML IJ SOLN
INTRAMUSCULAR | Status: AC
  Filled 2013-07-31: qty 2

## 2013-07-31 MED ORDER — HYDROMORPHONE 0.3 MG/ML IV SOLN
INTRAVENOUS | Status: DC
  Administered 2013-07-31: 10:00:00 via INTRAVENOUS
  Administered 2013-07-31: 5.8 mg via INTRAVENOUS
  Administered 2013-07-31: 05:00:00 via INTRAVENOUS
  Administered 2013-07-31: 2.39 mg via INTRAVENOUS
  Administered 2013-07-31: 1.6 mg via INTRAVENOUS
  Administered 2013-07-31: 07:00:00 via INTRAVENOUS
  Administered 2013-07-31: 9.35 mg via INTRAVENOUS
  Filled 2013-07-31 (×3): qty 25

## 2013-07-31 MED ORDER — DIPHENHYDRAMINE HCL 50 MG/ML IJ SOLN
25.0000 mg | Freq: Once | INTRAMUSCULAR | Status: AC
  Administered 2013-07-31: 25 mg via INTRAVENOUS
  Filled 2013-07-31: qty 1

## 2013-07-31 MED ORDER — HYDROXYUREA 500 MG PO CAPS
500.0000 mg | ORAL_CAPSULE | Freq: Two times a day (BID) | ORAL | Status: DC
  Administered 2013-07-31 – 2013-08-04 (×7): 500 mg via ORAL
  Filled 2013-07-31 (×11): qty 1

## 2013-07-31 MED ORDER — HYDROMORPHONE 0.3 MG/ML IV SOLN
INTRAVENOUS | Status: DC
  Administered 2013-07-31: 10.33 mg via INTRAVENOUS
  Administered 2013-07-31 (×3): via INTRAVENOUS
  Administered 2013-07-31: 7.56 mg via INTRAVENOUS
  Administered 2013-07-31 – 2013-08-01 (×3): via INTRAVENOUS
  Administered 2013-08-01: 2.64 mg via INTRAVENOUS
  Administered 2013-08-01 (×2): via INTRAVENOUS
  Administered 2013-08-01: 1.02 mg via INTRAVENOUS
  Administered 2013-08-01: 2.05 mg via INTRAVENOUS
  Administered 2013-08-01: 08:00:00 via INTRAVENOUS
  Administered 2013-08-01: 8.26 mg via INTRAVENOUS
  Administered 2013-08-01: 5.49 mg via INTRAVENOUS
  Administered 2013-08-01: 8.49 mg via INTRAVENOUS
  Administered 2013-08-02: 02:00:00 via INTRAVENOUS
  Filled 2013-07-31 (×9): qty 25

## 2013-07-31 MED ORDER — SODIUM CHLORIDE 0.9 % IV SOLN
Freq: Once | INTRAVENOUS | Status: AC
  Administered 2013-07-31: 03:00:00 via INTRAVENOUS

## 2013-07-31 MED ORDER — NALOXONE HCL 0.4 MG/ML IJ SOLN: 0.4000 mg | INTRAMUSCULAR | Status: DC | PRN

## 2013-07-31 MED ORDER — METOCLOPRAMIDE HCL 5 MG/ML IJ SOLN
10.0000 mg | Freq: Once | INTRAMUSCULAR | Status: AC
  Administered 2013-07-31: 10 mg via INTRAVENOUS
  Filled 2013-07-31: qty 2

## 2013-07-31 MED ORDER — DIPHENHYDRAMINE HCL 25 MG PO CAPS: 25.0000 mg | ORAL_CAPSULE | Freq: Four times a day (QID) | ORAL | Status: DC | PRN

## 2013-07-31 MED ORDER — HEPARIN SODIUM (PORCINE) 5000 UNIT/ML IJ SOLN
5000.0000 [IU] | Freq: Three times a day (TID) | INTRAMUSCULAR | Status: DC
  Administered 2013-07-31 – 2013-08-03 (×9): 5000 [IU] via SUBCUTANEOUS
  Filled 2013-07-31 (×16): qty 1

## 2013-07-31 MED ORDER — DIPHENHYDRAMINE HCL 50 MG/ML IJ SOLN
50.0000 mg | Freq: Four times a day (QID) | INTRAMUSCULAR | Status: DC | PRN
  Administered 2013-08-01: 50 mg via INTRAVENOUS
  Filled 2013-07-31 (×2): qty 1

## 2013-07-31 MED ORDER — FOLIC ACID 1 MG PO TABS
1500.0000 ug | ORAL_TABLET | Freq: Every morning | ORAL | Status: DC
  Administered 2013-07-31 – 2013-08-04 (×5): 1.5 mg via ORAL
  Filled 2013-07-31 (×5): qty 1.5

## 2013-07-31 MED ORDER — DEXTROSE-NACL 5-0.45 % IV SOLN
INTRAVENOUS | Status: DC
  Administered 2013-07-31: 1000 mL via INTRAVENOUS
  Administered 2013-07-31: 18:00:00 via INTRAVENOUS
  Administered 2013-08-01: 1000 mL via INTRAVENOUS
  Administered 2013-08-01 – 2013-08-03 (×4): via INTRAVENOUS

## 2013-07-31 MED ORDER — ONDANSETRON HCL 4 MG/2ML IJ SOLN
4.0000 mg | Freq: Four times a day (QID) | INTRAMUSCULAR | Status: DC | PRN
  Administered 2013-08-01: 4 mg via INTRAVENOUS
  Filled 2013-07-31: qty 2

## 2013-07-31 NOTE — ED Provider Notes (Signed)
Patient initially was going to leave because IV access was not able to be obtained and should I want to get intramuscular analgesics. However, IV access was obtained albeit with a very small IV. She's received one dose of hydromorphone but states that she is still in severe pain. At this point, she has received about 500 mL bolus. She will be given a second dose of hydromorphone and additional IV fluid and reassessed.  She got slight relief of pain with her second dose of hydromorphone and still incomplete relief of pain with the third dose. Because of inability to adequately control pain, arrangements are made to admit the patient. Case is discussed with Dr. Alcario Drought of [facility] who agrees to admit the patient.  Results for orders placed during the hospital encounter of 07/30/13  CBC      Result Value Ref Range   WBC 18.7 (*) 4.0 - 10.5 K/uL   RBC 3.16 (*) 3.87 - 5.11 MIL/uL   Hemoglobin 9.8 (*) 12.0 - 15.0 g/dL   HCT 27.6 (*) 36.0 - 46.0 %   MCV 87.3  78.0 - 100.0 fL   MCH 31.0  26.0 - 34.0 pg   MCHC 35.5  30.0 - 36.0 g/dL   RDW 16.3 (*) 11.5 - 15.5 %   Platelets 563 (*) 150 - 400 K/uL  COMPREHENSIVE METABOLIC PANEL      Result Value Ref Range   Sodium 140  137 - 147 mEq/L   Potassium 3.4 (*) 3.7 - 5.3 mEq/L   Chloride 103  96 - 112 mEq/L   CO2 22  19 - 32 mEq/L   Glucose, Bld 107 (*) 70 - 99 mg/dL   BUN 5 (*) 6 - 23 mg/dL   Creatinine, Ser 0.58  0.50 - 1.10 mg/dL   Calcium 9.4  8.4 - 10.5 mg/dL   Total Protein 8.1  6.0 - 8.3 g/dL   Albumin 4.4  3.5 - 5.2 g/dL   AST 20  0 - 37 U/L   ALT 12  0 - 35 U/L   Alkaline Phosphatase 58  39 - 117 U/L   Total Bilirubin 2.2 (*) 0.3 - 1.2 mg/dL   GFR calc non Af Amer >90  >90 mL/min   GFR calc Af Amer >90  >90 mL/min  RETICULOCYTES      Result Value Ref Range   Retic Ct Pct 15.0 (*) 0.4 - 3.1 %   RBC. 3.16 (*) 3.87 - 5.11 MIL/uL   Retic Count, Manual 474.0 (*) 19.0 - 186.0 K/uL   Dg Chest 2 View  07/30/2013   CLINICAL DATA:   Sickle cell crisis, cough, congestion  EXAM: CHEST  2 VIEW  COMPARISON:  DG CHEST 2 VIEW dated 04/28/2013  FINDINGS: Heart size and vascular pattern are normal. There is no consolidation or effusion. There is a thin band of linear density in the right upper lobe most consistent with scarring. It is stable.  IMPRESSION: Negative for acute abnormalities.   Electronically Signed   By: Skipper Cliche M.D.   On: 07/30/2013 49:70     Delora Fuel, MD 26/37/85 [id number]

## 2013-07-31 NOTE — H&P (Addendum)
[facility] History and Physical  NASHALY DORANTES ION:[id number] DOB: 03/24/1991 DOA: 07/30/2013  Referring physician: EDP PCP: MATTHEWS,MICHELLE A., MD   Chief Complaint: Sickle cell pain crisis   HPI: [name] is a 23 y.o. female who presents to the ED with sickle cell pain crisis.  Pain located in legs, joints and back.  Pain 6/10 and typical for her crisis symptoms.  10mg  oxycodone have not helped the symptoms.  Review of Systems: Systems reviewed.  As above, otherwise negative  Past Medical History  Diagnosis Date  . H/O: 1 miscarriage 03/22/2011    Pt reports 2 miscarriages.  [name] 01/08/2009  . Depression 01/06/2011  . GERD (gastroesophageal reflux disease) 02/17/2011  . Trichotillomania     h/o  . Blood transfusion     "lots"  . Sickle cell anemia with crisis   . Exertional dyspnea     "sometimes"  . Sickle cell anemia   . Headache(784.0)   . Migraines 11/08/11    "@ least twice/month"  . Chronic back pain     "very severe; have knot in my back; from tight muscle; take RX and exercise for it"  . Mood swings 11/08/11    "I go back and forth; real bad"  . Pregnancy   . Blood dyscrasia     SICKLE CELL   Past Surgical History  Procedure Laterality Date  . Cholecystectomy  05/2010  . Dilation and curettage of uterus  02/20/11    S/P miscarriage   Social History:  reports that she has been smoking Cigarettes.  She has a .25 pack-year smoking history. She has never used smokeless tobacco. She reports that she drinks alcohol. She reports that she does not use illicit drugs.  Allergies  Allergen Reactions  . Carrot Oil Hives and Swelling  . Latex Rash    Family History  Problem Relation Age of Onset  . Diabetes Mother   . Alcoholism    . Depression    . Hypercholesterolemia    . Hypertension    . Migraines    . Diabetes Maternal Grandmother   . Diabetes Paternal Grandmother      Prior to Admission medications   Medication Sig  Start Date End Date Taking? Authorizing Provider  folic acid (FOLVITE) 244 MCG tablet Take 2 tablets (1,600 mcg total) by mouth every morning. 02/26/13  Yes [name], MD  hydroxyurea (HYDREA) 500 MG capsule Take 500 mg by mouth 2 (two) times daily. May take with food to minimize GI side effects.   Yes Historical Provider, MD  methadone (DOLOPHINE) 5 MG tablet Take 1 tablet (5 mg total) by mouth at bedtime. 06/01/13  Yes Elwyn Reach, MD  Oxycodone HCl 10 MG TABS Take 1.5 tablets (15 mg total) by mouth every 6 (six) hours as needed (pain). 07/18/13  Yes Dorena Dew, FNP   Physical Exam: Filed Vitals:   07/31/13 0141  BP: 112/97  Pulse: 115  Temp:   Resp:     BP 112/97  Pulse 115  Temp(Src) 99.1 F (37.3 C) (Oral)  Resp 18  Ht 5\' 2"  (1.575 m)  Wt 79.379 kg (175 lb)  BMI 32.00 kg/m2  SpO2 96%  LMP 07/07/2013  General Appearance:    Alert, oriented, no distress, appears stated age  Head:    Normocephalic, atraumatic  Eyes:    PERRL, EOMI, sclera non-icteric        Nose:   Nares without drainage or epistaxis. Mucosa,  turbinates normal  Throat:   Moist mucous membranes. Oropharynx without erythema or exudate.  Neck:   Supple. No carotid bruits.  No thyromegaly.  No lymphadenopathy.   Back:     No CVA tenderness, no spinal tenderness  Lungs:     Clear to auscultation bilaterally, without wheezes, rhonchi or rales  Chest wall:    No tenderness to palpitation  Heart:    Regular rate and rhythm without murmurs, gallops, rubs  Abdomen:     Soft, non-tender, nondistended, normal bowel sounds, no organomegaly  Genitalia:    deferred  Rectal:    deferred  Extremities:   No clubbing, cyanosis or edema.  Pulses:   2+ and symmetric all extremities  Skin:   Skin color, texture, turgor normal, no rashes or lesions  Lymph nodes:   Cervical, supraclavicular, and axillary nodes normal  Neurologic:   CNII-XII intact. Normal strength, sensation and reflexes      throughout     Labs on Admission:  Basic Metabolic Panel:  Recent Labs Lab 07/30/13 2220  NA 140  K 3.4*  CL 103  CO2 22  GLUCOSE 107*  BUN 5*  CREATININE 0.58  CALCIUM 9.4   Liver Function Tests:  Recent Labs Lab 07/30/13 2220  AST 20  ALT 12  ALKPHOS 58  BILITOT 2.2*  PROT 8.1  ALBUMIN 4.4   No results found for this basename: LIPASE, AMYLASE,  in the last 168 hours No results found for this basename: AMMONIA,  in the last 168 hours CBC:  Recent Labs Lab 07/30/13 2220  WBC 18.7*  HGB 9.8*  HCT 27.6*  MCV 87.3  PLT 563*   Cardiac Enzymes: No results found for this basename: CKTOTAL, CKMB, CKMBINDEX, TROPONINI,  in the last 168 hours  BNP (last 3 results) No results found for this basename: PROBNP,  in the last 8760 hours CBG: No results found for this basename: GLUCAP,  in the last 168 hours  Radiological Exams on Admission: Dg Chest 2 View  07/30/2013   CLINICAL DATA:  Sickle cell crisis, cough, congestion  EXAM: CHEST  2 VIEW  COMPARISON:  DG CHEST 2 VIEW dated 04/28/2013  FINDINGS: Heart size and vascular pattern are normal. There is no consolidation or effusion. There is a thin band of linear density in the right upper lobe most consistent with scarring. It is stable.  IMPRESSION: Negative for acute abnormalities.   Electronically Signed   By: Skipper Cliche M.D.   On: 07/30/2013 21:56    EKG: Independently reviewed.  Assessment/Plan Principal Problem:   Hb-SS disease with crisis Active Problems:   Sickle cell crisis   1. Sickle cell crisis - will admit patient on Dilaudid PCA with settings as per her plan of care written by Dr. Zigmund Daniel in the [facility] tab in her chart.  No evidence of acute chest, HGB > 9, no indication for transfusion.  Will hold her home short acting oxycodone but continue her long acting methadone as well as her hydrea and folate.  IVF ordered as well.    Code Status: Full Code  Family Communication: No family in room Disposition Plan:  Admit to obs   Time spent: 30 min  Anjanae Woehrle M. [facility] Pager [phone]  If 7AM-7PM, please contact the day team taking care of the patient [email or web address] Password [email or web address] 07/31/2013, 3:15 AM

## 2013-07-31 NOTE — Care Management Note (Signed)
   CARE MANAGEMENT NOTE 07/31/2013  Patient:  [name], [name]   Account Number:  [id number]  Date Initiated:  07/31/2013  Documentation initiated by:  Cordelro Gautreau  Subjective/Objective Assessment:   23 yo female admitted with HB-SS Disease with crisis.     Action/Plan:   Home when stable   Anticipated DC Date:     Anticipated DC Plan:  [place]  CM consult      Choice offered to / List presented to:  NA   DME arranged  NA      DME agency  NA     [place] arranged  NA      [place] agency  NA   Status of service:  Completed, signed off Medicare Important Message given?   (If response is "NO", the following Medicare IM given date fields will be blank) Date Medicare IM given:   Date Additional Medicare IM given:    Discharge Disposition:    Per UR Regulation:  Reviewed for med. necessity/level of care/duration of stay  If discussed at [place] of Stay Meetings, dates discussed:    Comments:  07/31/13 [place] [phone] Chart reviewed for utilization of services. No needs identified at this time. Identified PCP and pharmacy.

## 2013-07-31 NOTE — Progress Notes (Signed)
SICKLE CELL SERVICE PROGRESS NOTE  [name] LKG:[id number] DOB: 1990-12-03 DOA: 07/30/2013 PCP: Patricio Popwell A., MD  Assessment/Plan: Principal Problem:   Hb-SS disease with crisis Active Problems:   Sickle cell crisis  1. Hb SS with crisis: Pt states that pain currently 10/10 localized to arms and upper back. I will add a basal rate to PCA and continue Toradol and IVF. Will continue basal rate through tomorrow morning and then re-evaluate. 2. Leukocytosis: No evidence of infection. This is likely related to her crisis. Will continue to monitor WBC count.  3. Sickle Cell disease: Pt is on Hydrea and Folic Acid however her MCV is not compatible with compliance of Hydrea. Will discuss further with patient. 4. Tachycardia: Likely related to her pain. No concurrent hypoxemia. Will monitor as pain is better controlled. 5. Mild Hypokalemia: Will replace orally.  Code Status: Full Code Family Communication: N/A Disposition Plan: Not yet ready for discharge.  Bridie Colquhoun A.  Pager [phone]. If 7PM-7AM, please contact night-coverage.  07/31/2013, 2:31 PM  LOS: 1 day   Brief narrative: [name] is a 23 y.o. female who presents to the ED with sickle cell pain crisis. Pain located in legs, joints and back. Pain 6/10 and typical for her crisis symptoms. 10mg  oxycodone have not helped the symptoms.   Consultants:  None  Procedures:  None  Antibiotics:  None  HPI/Subjective: Pt states that she had a sudden onset of pain in her arms and back starting yesterday morning. She tried to treat her pain at home with oral medications and hydration however the intensity of pain exceeded the effect of the oral analgesics and hydration. Currently she rates the pain 10/10 localized to arms and upper back.  Objective: Filed Vitals:   07/31/13 1010 07/31/13 1140 07/31/13 1214 07/31/13 1402  BP:      Pulse:      Temp:      TempSrc:      Resp: 18 15 20 16   Height:       Weight:      SpO2: 98% 98% 96% 98%   Weight change:   Intake/Output Summary (Last 24 hours) at 07/31/13 1431 Last data filed at 07/31/13 0944  Gross per 24 hour  Intake 1870.33 ml  Output    500 ml  Net 1370.33 ml    General: Alert, awake, oriented x3, in no acute distress.  HEENT: [place]/AT PEERL, EOMI, anicteric Neck: Trachea midline,  no masses, no thyromegal,y no JVD, no carotid bruit OROPHARYNX:  Moist, No exudate/ erythema/lesions.  Heart: Regular rhythm but tachycardic, without murmurs, rubs, gallops.  Lungs: Clear to auscultation, no wheezing or rhonchi noted.  Abdomen: Soft, nontender, nondistended, positive bowel sounds, no masses no hepatosplenomegaly noted.  Neuro: No focal neurological deficits noted cranial nerves II through XII grossly intact. Strength normal in bilateral upper and lower extremities. Musculoskeletal: No warm swelling or erythema around joints, no spinal tenderness noted. Psychiatric: Patient alert and oriented x3, good insight and cognition, good recent to remote recall.   Data Reviewed: Basic Metabolic Panel:  Recent Labs Lab 07/30/13 2220  NA 140  K 3.4*  CL 103  CO2 22  GLUCOSE 107*  BUN 5*  CREATININE 0.58  CALCIUM 9.4   Liver Function Tests:  Recent Labs Lab 07/30/13 2220  AST 20  ALT 12  ALKPHOS 58  BILITOT 2.2*  PROT 8.1  ALBUMIN 4.4   No results found for this basename: LIPASE, AMYLASE,  in the last 168 hours No  results found for this basename: AMMONIA,  in the last 168 hours CBC:  Recent Labs Lab 07/30/13 2220  WBC 18.7*  HGB 9.8*  HCT 27.6*  MCV 87.3  PLT 563*   Cardiac Enzymes: No results found for this basename: CKTOTAL, CKMB, CKMBINDEX, TROPONINI,  in the last 168 hours BNP (last 3 results) No results found for this basename: PROBNP,  in the last 8760 hours CBG: No results found for this basename: GLUCAP,  in the last 168 hours  No results found for this or any previous visit (from the past 240  hour(s)).   Studies: Dg Chest 2 View  07/30/2013   CLINICAL DATA:  Sickle cell crisis, cough, congestion  EXAM: CHEST  2 VIEW  COMPARISON:  DG CHEST 2 VIEW dated 04/28/2013  FINDINGS: Heart size and vascular pattern are normal. There is no consolidation or effusion. There is a thin band of linear density in the right upper lobe most consistent with scarring. It is stable.  IMPRESSION: Negative for acute abnormalities.   Electronically Signed   By: Skipper Cliche M.D.   On: 07/30/2013 21:56   Dg Ankle Complete Right  07/16/2013   CLINICAL DATA:  Foot pain after trauma  EXAM: RIGHT ANKLE - COMPLETE 3+ VIEW  COMPARISON:  None.  FINDINGS: No evidence of ankle fracture or malalignment. There is slight irregularity at the base of the fifth metatarsal, indeterminate. No joint narrowing. Cannot exclude an ankle effusion.  IMPRESSION: 1. Negative for ankle fracture malalignment. 2. Subtle irregularity of the fifth metatarsal base. If focal tenderness in this location, recommend dedicated foot radiography to evaluate for avulsion fracture.   Electronically Signed   By: Jorje Guild M.D.   On: 07/16/2013 03:38   Dg Foot Complete Right  07/16/2013   CLINICAL DATA:  Foot pain.  Difficult exam.  EXAM: RIGHT FOOT COMPLETE - 3+ VIEW  COMPARISON:  None.  FINDINGS: There is mild soft tissue swelling anterior to the ankle. No acute fracture or malalignment. No significant or focal joint narrowing.  IMPRESSION: No acute osseous abnormality.   Electronically Signed   By: Jorje Guild M.D.   On: 07/16/2013 04:31    Scheduled Meds: . folic acid  1,950 mcg Oral q morning - 10a  . heparin  5,000 Units Subcutaneous 3 times per day  . HYDROmorphone PCA 0.3 mg/mL   Intravenous 6 times per day  . hydroxyurea  500 mg Oral BID  . ketorolac  30 mg Intravenous 4 times per day  . methadone  5 mg Oral QHS   Continuous Infusions: . dextrose 5 % and 0.45% NaCl 1,000 mL (07/31/13 0656)

## 2013-07-31 NOTE — Plan of Care (Signed)
Problem: Phase I Progression Outcomes Goal: Pain controlled with appropriate interventions Outcome: Progressing Pain 8/10, started on custom dose PCA

## 2013-07-31 NOTE — Progress Notes (Signed)
Admitted to room 1307 from ED.

## 2013-07-31 NOTE — Plan of Care (Signed)
Problem: Phase I Progression Outcomes Goal: Pulmonary Hygiene as Indicated (Sickle Cell) Outcome: Progressing Given IS

## 2013-08-01 ENCOUNTER — Ambulatory Visit: Payer: Medicare Other | Admitting: Family Medicine

## 2013-08-01 MED ORDER — KETOROLAC TROMETHAMINE 15 MG/ML IJ SOLN
30.0000 mg | Freq: Four times a day (QID) | INTRAMUSCULAR | Status: DC
  Administered 2013-08-01 – 2013-08-04 (×13): 30 mg via INTRAVENOUS
  Filled 2013-08-01 (×21): qty 2

## 2013-08-01 NOTE — Progress Notes (Signed)
Subjective: A 23 year old female admitted with sickle cell painful crisis. Patient has been having severe pain currently at 9/10. She is on IV Dilaudid PCA with some basal rates. She has used 42 mg of Dilaudid in the last 24 hours with 61 demands and 58 deliveries. Denies fever, no nausea vomiting or diarrhea. She has been in bed for the most part and did not sleep much at night.  Objective: Vital signs in last 24 hours: Temp:  [98.3 F (36.8 C)-100.2 F (37.9 C)] 99.9 F (37.7 C) (03/19 0455) Pulse Rate:  [100-117] 105 (03/19 0455) Resp:  [11-20] 11 (03/19 0455) BP: (103-135)/(59-77) 103/60 mmHg (03/19 0455) SpO2:  [94 %-100 %] 98 % (03/19 0455) FiO2 (%):  [21 %] 21 % (03/19 0327) Weight change:  Last BM Date:  (PTA)  Intake/Output from previous day: 03/18 0701 - 03/19 0700 In: 2601.3 [P.O.:840; I.V.:1711.3; IV Piggyback:50] Out: 801 [Urine:801] Intake/Output this shift:    General appearance: appears stated age, fatigued and mild distress Eyes: conjunctivae/corneas clear. PERRL, EOM's intact. Fundi benign. Neck: no adenopathy, no carotid bruit, no JVD, supple, symmetrical, trachea midline and thyroid not enlarged, symmetric, no tenderness/mass/nodules Back: symmetric, no curvature. ROM normal. No CVA tenderness. Resp: clear to auscultation bilaterally Chest wall: no tenderness Cardio: regular rate and rhythm, S1, S2 normal, no murmur, click, rub or gallop Extremities: extremities normal, atraumatic, no cyanosis or edema Pulses: 2+ and symmetric Skin: Skin color, texture, turgor normal. No rashes or lesions Neurologic: Grossly normal  Lab Results:  Recent Labs  07/30/13 2220  WBC 18.7*  HGB 9.8*  HCT 27.6*  PLT 563*   BMET  Recent Labs  07/30/13 2220  NA 140  K 3.4*  CL 103  CO2 22  GLUCOSE 107*  BUN 5*  CREATININE 0.58  CALCIUM 9.4    Studies/Results: Dg Chest 2 View  07/30/2013   CLINICAL DATA:  Sickle cell crisis, cough, congestion  EXAM: CHEST  2  VIEW  COMPARISON:  DG CHEST 2 VIEW dated 04/28/2013  FINDINGS: Heart size and vascular pattern are normal. There is no consolidation or effusion. There is a thin band of linear density in the right upper lobe most consistent with scarring. It is stable.  IMPRESSION: Negative for acute abnormalities.   Electronically Signed   By: Skipper Cliche M.D.   On: 07/30/2013 21:56    Medications: I have reviewed the patient's current medications.  Assessment/Plan: A 23 yo female admitted with sickle cell painful crisis.  #1 sickle cell painful crisis: We will continue his current Dilaudid PCA dosing. Patient will have basal rate for night time and then rest during the day. Continue with Toradol and other supportive measures. She will be encouraged to become ambulatory starting from tomorrow.  #2 hypokalemia: We'll replete her potassium monitor her BMP  #3 leukocytosis: More than likely from her sickle cell acute crisis. We'll keep an eye monitor.  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 We will continue to monitor.  #5 thrombocytosis: Secondary to sickle cell crisis. Also autosplenectomy.  LOS: 2 days   Carole Deere,LAWAL 08/01/2013, 7:11 AM

## 2013-08-01 NOTE — Progress Notes (Signed)
Patient would not awake enough to take pills. Patient would open eyes and roll back over in the bed. Held hydrea and methadone.

## 2013-08-01 NOTE — Plan of Care (Signed)
Problem: Phase I Progression Outcomes Goal: Hemodynamically stable Outcome: Progressing Remains tachycardic but stable

## 2013-08-01 NOTE — Progress Notes (Signed)
Patient started her menses this am. Dark red bleeding noted.

## 2013-08-01 NOTE — Plan of Care (Signed)
Problem: Phase I Progression Outcomes Goal: Pain controlled with appropriate interventions Outcome: Progressing 08/01/13-0230-Patient on basal dose with PCA. Patient has been able to sleep tonight. When awake rating pain 8-9 in left arm and legs, patient has vomited once around 0200.

## 2013-08-01 NOTE — Progress Notes (Signed)
Patient vomited again. Given benadryl prn.

## 2013-08-02 DIAGNOSIS — F17200 Nicotine dependence, unspecified, uncomplicated: Secondary | ICD-10-CM

## 2013-08-02 DIAGNOSIS — R000 Tachycardia, unspecified: Secondary | ICD-10-CM

## 2013-08-02 DIAGNOSIS — G8929 Other chronic pain: Secondary | ICD-10-CM

## 2013-08-02 DIAGNOSIS — D649 Anemia, unspecified: Secondary | ICD-10-CM

## 2013-08-02 DIAGNOSIS — E876 Hypokalemia: Secondary | ICD-10-CM | POA: Diagnosis not present

## 2013-08-02 LAB — COMPREHENSIVE METABOLIC PANEL
ALK PHOS: 69 U/L (ref 39–117)
ALT: 9 U/L (ref 0–35)
AST: 20 U/L (ref 0–37)
Albumin: 3.6 g/dL (ref 3.5–5.2)
BILIRUBIN TOTAL: 2.7 mg/dL — AB (ref 0.3–1.2)
BUN: 3 mg/dL — ABNORMAL LOW (ref 6–23)
CHLORIDE: 100 meq/L (ref 96–112)
CO2: 26 mEq/L (ref 19–32)
CREATININE: 0.55 mg/dL (ref 0.50–1.10)
Calcium: 8.9 mg/dL (ref 8.4–10.5)
GFR calc Af Amer: >90 mL/min (ref >90)
Glucose, Bld: 110 mg/dL — ABNORMAL HIGH (ref 70–99)
POTASSIUM: 3.5 meq/L — AB (ref 3.7–5.3)
Sodium: 137 mEq/L (ref 137–147)
Total Protein: 6.5 g/dL (ref 6.0–8.3)

## 2013-08-02 LAB — MAGNESIUM: Magnesium: 1.8 mg/dL (ref 1.5–2.5)

## 2013-08-02 LAB — CBC WITH DIFFERENTIAL/PLATELET
BASOS ABS: 0.1 10*3/uL (ref 0.0–0.1)
Basophils Relative: 0 % (ref 0–1)
Eosinophils Absolute: 0.9 10*3/uL — ABNORMAL HIGH (ref 0.0–0.7)
Eosinophils Relative: 4 % (ref 0–5)
HEMATOCRIT: 22.2 % — AB (ref 36.0–46.0)
HEMOGLOBIN: 7.8 g/dL — AB (ref 12.0–15.0)
LYMPHS PCT: 11 % — AB (ref 12–46)
Lymphs Abs: 2.4 10*3/uL (ref 0.7–4.0)
MCH: 31 pg (ref 26.0–34.0)
MCHC: 35.1 g/dL (ref 30.0–36.0)
MCV: 88.1 fL (ref 78.0–100.0)
MONO ABS: 2.2 10*3/uL — AB (ref 0.1–1.0)
Monocytes Relative: 11 % (ref 3–12)
Neutro Abs: 15.8 10*3/uL — ABNORMAL HIGH (ref 1.7–7.7)
Neutrophils Relative %: 74 % (ref 43–77)
Platelets: 480 10*3/uL — ABNORMAL HIGH (ref 150–400)
RBC: 2.52 MIL/uL — ABNORMAL LOW (ref 3.87–5.11)
RDW: 17.7 % — ABNORMAL HIGH (ref 11.5–15.5)
WBC: 21.4 10*3/uL — ABNORMAL HIGH (ref 4.0–10.5)

## 2013-08-02 MED ORDER — HYDROMORPHONE 0.3 MG/ML IV SOLN
INTRAVENOUS | Status: DC
  Administered 2013-08-02: 3.47 mg via INTRAVENOUS
  Administered 2013-08-02: 15:00:00 via INTRAVENOUS
  Administered 2013-08-02: 1.6 mg via INTRAVENOUS
  Administered 2013-08-02: 10:00:00 via INTRAVENOUS
  Administered 2013-08-02: 6.39 mg via INTRAVENOUS
  Administered 2013-08-02: 22:00:00 via INTRAVENOUS
  Administered 2013-08-02: 5.89 mg via INTRAVENOUS
  Administered 2013-08-03: 02:00:00 via INTRAVENOUS
  Administered 2013-08-03: 1.6 mg via INTRAVENOUS
  Administered 2013-08-03: 8.59 mg via INTRAVENOUS
  Filled 2013-08-02 (×4): qty 25

## 2013-08-02 MED ORDER — POTASSIUM CHLORIDE CRYS ER 20 MEQ PO TBCR
40.0000 meq | EXTENDED_RELEASE_TABLET | Freq: Two times a day (BID) | ORAL | Status: AC
  Administered 2013-08-02 (×2): 40 meq via ORAL
  Filled 2013-08-02 (×2): qty 2

## 2013-08-02 MED ORDER — HYDROMORPHONE HCL PF 2 MG/ML IJ SOLN
2.0000 mg | INTRAMUSCULAR | Status: DC | PRN
  Administered 2013-08-02: 2 mg via INTRAVENOUS
  Filled 2013-08-02: qty 1

## 2013-08-02 MED ORDER — POLYETHYLENE GLYCOL 3350 17 G PO PACK
17.0000 g | PACK | Freq: Every day | ORAL | Status: DC
  Administered 2013-08-02: 17 g via ORAL
  Filled 2013-08-02 (×2): qty 1

## 2013-08-02 NOTE — Progress Notes (Addendum)
PHYSICIAN PROGRESS NOTE  [name] TDV:[id number] DOB: 1991/03/10 DOA: 07/30/2013  08/02/2013   PCP: [name]  Admission History: [name] is a 23 y.o. female who presents to the ED with sickle cell pain crisis. Pain located in legs, joints and back. Pain 10/10 and typical for her crisis symptoms.  Home methadone and 10mg  oxycodone have not helped the symptoms.  Pt was admitted for acute sickle cell pain crisis treatment.   Assessment/Plan: 1. Hb SS with crisis: Pt states that pain currently 7-8/10 localized legs.  She is still having pain with ambulation.  She has used less dilaudid in last 24 hours.  PCA interrogation reporting 35.52 mg total with 48 demands and 42 deliveries.  Will discontinue basal rate on PCA, continue Toradol and IVF.  Encourage ambulation today and incentive spirometry.  Consult physical therapy.  Decreasing IVFs today.  2. Leukocytosis: No evidence of infection. This is likely related to her crisis. Will continue to monitor WBC count.  3. Sickle Cell disease: Pt is on Hydrea and Folic Acid however her MCV is not compatible with compliance of Hydrea. Will continue counseling with patient about medication compliance. 4. Chronic Constipation - adding miralax today, monitoring 5. Tachycardia: Likely related to pain. No current hypoxemia. Will monitor as pain is better controlled. 6. Mild Hypokalemia: Will replace orally. Check Magnesium.  Following CMP.   Code Status: Full Code  Family Communication: N/A  Disposition Plan: Not yet ready for discharge.  HPI/Subjective: Pt reports that pain in legs is 8/10. She is constipated.  She is still having pain with ambulation.  No SOB and no CP.   Objective: Filed Vitals:   08/02/13 0603  BP: 121/73  Pulse: 91  Temp: 98.1 F (36.7 C)  Resp: 18    Intake/Output Summary (Last 24 hours) at 08/02/13 0757 Last data filed at 08/02/13 0604  Gross per 24 hour  Intake    720 ml  Output      0 ml   Net    720 ml   Filed Weights   07/30/13 1910 07/31/13 0402  Weight: 175 lb (79.379 kg) 176 lb 3.2 oz (79.924 kg)   Review of Systems:  As above otherwise all reviewed and reported negative  Exam:  General: Alert, awake, oriented x3, in no acute distress.  HEENT: [place]/AT PEERL, EOMI Neck: Trachea midline, no masses, no thyromegal,y no JVD, no carotid bruit  OROPHARYNX: No exudate/ erythema/lesions.  Heart: Regular rhythm but tachycardic, without murmurs, rubs, gallops.  Lungs: Clear to auscultation, no wheezing or rhonchi noted.  Abdomen: Soft, nontender, nondistended, positive bowel sounds, no masses no hepatosplenomegaly noted.  Neuro: No focal neurological deficits noted cranial nerves II through XII grossly intact. Strength normal in bilateral upper and lower extremities.  Musculoskeletal: Painful right thigh. No warmth, swelling or erythema around joints, no spinal tenderness noted.  Psychiatric: Patient alert and oriented x3, good insight and cognition, good recent to remote recall.   Data Reviewed: Basic Metabolic Panel:  Recent Labs Lab 07/30/13 2220 08/02/13 0315  NA 140 137  K 3.4* 3.5*  CL 103 100  CO2 22 26  GLUCOSE 107* 110*  BUN 5* 3*  CREATININE 0.58 0.55  CALCIUM 9.4 8.9   Liver Function Tests:  Recent Labs Lab 07/30/13 2220 08/02/13 0315  AST 20 20  ALT 12 9  ALKPHOS 58 69  BILITOT 2.2* 2.7*  PROT 8.1 6.5  ALBUMIN 4.4 3.6   No results found for this basename:  LIPASE, AMYLASE,  in the last 168 hours No results found for this basename: AMMONIA,  in the last 168 hours CBC:  Recent Labs Lab 07/30/13 2220 08/02/13 0315  WBC 18.7* 21.4*  NEUTROABS  --  15.8*  HGB 9.8* 7.8*  HCT 27.6* 22.2*  MCV 87.3 88.1  PLT 563* 480*   Cardiac Enzymes: No results found for this basename: CKTOTAL, CKMB, CKMBINDEX, TROPONINI,  in the last 168 hours BNP (last 3 results) No results found for this basename: PROBNP,  in the last 8760 hours CBG: No results  found for this basename: GLUCAP,  in the last 168 hours  No results found for this or any previous visit (from the past 240 hour(s)).   Studies: No results found.  Scheduled Meds: . folic acid  6,503 mcg Oral q morning - 10a  . heparin  5,000 Units Subcutaneous 3 times per day  . HYDROmorphone PCA 0.3 mg/mL   Intravenous 6 times per day  . hydroxyurea  500 mg Oral BID  . ketorolac  30 mg Intravenous 4 times per day  . methadone  5 mg Oral QHS  . polyethylene glycol  17 g Oral Daily  . potassium chloride  40 mEq Oral BID   Continuous Infusions: . dextrose 5 % and 0.45% NaCl 100 mL/hr at 08/01/13 2349   Principal Problem:   Hb-SS disease with crisis Active Problems:   Sickle cell crisis  [facility] Pager [phone]. If 7PM-7AM, please contact night-coverage at [email or web address], password [facility] 08/02/2013, 7:57 AM  LOS: 3 days

## 2013-08-03 DIAGNOSIS — K5900 Constipation, unspecified: Secondary | ICD-10-CM

## 2013-08-03 DIAGNOSIS — E876 Hypokalemia: Secondary | ICD-10-CM

## 2013-08-03 LAB — COMPREHENSIVE METABOLIC PANEL
ALK PHOS: 65 U/L (ref 39–117)
ALT: 13 U/L (ref 0–35)
AST: 18 U/L (ref 0–37)
Albumin: 3.5 g/dL (ref 3.5–5.2)
BILIRUBIN TOTAL: 2.4 mg/dL — AB (ref 0.3–1.2)
BUN: 6 mg/dL (ref 6–23)
CO2: 26 meq/L (ref 19–32)
Calcium: 8.9 mg/dL (ref 8.4–10.5)
Chloride: 101 mEq/L (ref 96–112)
Creatinine, Ser: 0.59 mg/dL (ref 0.50–1.10)
GFR calc Af Amer: >90 mL/min (ref >90)
GFR calc non Af Amer: >90 mL/min (ref >90)
Glucose, Bld: 100 mg/dL — ABNORMAL HIGH (ref 70–99)
Potassium: 4.1 mEq/L (ref 3.7–5.3)
SODIUM: 138 meq/L (ref 137–147)
Total Protein: 6.5 g/dL (ref 6.0–8.3)

## 2013-08-03 LAB — CBC
HCT: 21.7 % — ABNORMAL LOW (ref 36.0–46.0)
Hemoglobin: 7.4 g/dL — ABNORMAL LOW (ref 12.0–15.0)
MCH: 31 pg (ref 26.0–34.0)
MCHC: 34.1 g/dL (ref 30.0–36.0)
MCV: 90.8 fL (ref 78.0–100.0)
PLATELETS: 476 10*3/uL — AB (ref 150–400)
RBC: 2.39 MIL/uL — ABNORMAL LOW (ref 3.87–5.11)
RDW: 17.5 % — AB (ref 11.5–15.5)
WBC: 21.2 10*3/uL — ABNORMAL HIGH (ref 4.0–10.5)

## 2013-08-03 LAB — MAGNESIUM: Magnesium: 1.9 mg/dL (ref 1.5–2.5)

## 2013-08-03 MED ORDER — OXYCODONE HCL 10 MG PO TABS: 15.0000 mg | ORAL_TABLET | Freq: Four times a day (QID) | ORAL | Status: DC

## 2013-08-03 MED ORDER — HYDROMORPHONE 0.3 MG/ML IV SOLN
INTRAVENOUS | Status: DC
  Administered 2013-08-03: 2.57 mg via INTRAVENOUS
  Administered 2013-08-03: 2.79 mg via INTRAVENOUS
  Administered 2013-08-03: 2.49 mg via INTRAVENOUS
  Administered 2013-08-03 (×2): via INTRAVENOUS
  Administered 2013-08-03: 6.83 mg via INTRAVENOUS
  Administered 2013-08-04: 3.49 mg via INTRAVENOUS
  Administered 2013-08-04: 13:00:00 via INTRAVENOUS
  Administered 2013-08-04: 5.99 mg via INTRAVENOUS
  Filled 2013-08-03 (×4): qty 25

## 2013-08-03 MED ORDER — POLYETHYLENE GLYCOL 3350 17 G PO PACK
17.0000 g | PACK | Freq: Two times a day (BID) | ORAL | Status: DC
  Administered 2013-08-03: 17 g via ORAL
  Filled 2013-08-03 (×4): qty 1

## 2013-08-03 MED ORDER — OXYCODONE HCL 5 MG PO TABS
15.0000 mg | ORAL_TABLET | Freq: Four times a day (QID) | ORAL | Status: DC
  Administered 2013-08-03 – 2013-08-04 (×5): 15 mg via ORAL
  Filled 2013-08-03 (×5): qty 3

## 2013-08-03 MED ORDER — VITAMINS A & D EX OINT
TOPICAL_OINTMENT | CUTANEOUS | Status: AC
  Administered 2013-08-03: 12:00:00
  Filled 2013-08-03: qty 5

## 2013-08-03 NOTE — Progress Notes (Signed)
Difficult IV stick- attempted 3 times- was able to obtain 24 G in upper arm.  Pt with very small veins.  Recommend PICC placement if this PIV lost.  IR was referrred to do last PICC per FYI tab.

## 2013-08-03 NOTE — Evaluation (Signed)
Physical Therapy Evaluation Patient Details Name: [name] MRN: [id number] DOB: 04-24-1991 Today's Date: 08/03/2013 Time: 2725-3664 PT Time Calculation (min): 22 min  PT Assessment / Plan / Recommendation History of Present Illness  23 yo female admitted with sickle cell crisis. hx of chronic bck pain, Gerd, depression  Clinical Impression  On eval, pt required Min guard assist for mobility-able to ambulate ~125 feet. Gait is moderately antalgic. Pt declined use of walker. Recommend 24 hour supervision/assist at home until pt's pain control and mobility improve. Recommend daily mobility with nursing supervision as well.     PT Assessment  Patient needs continued PT services    Follow Up Recommendations  No PT follow up;Supervision/Assistance - 24 hour (initially)    Does the patient have the potential to tolerate intense rehabilitation      Barriers to Discharge        Equipment Recommendations  None recommended by PT (pt states she already has RW)    Recommendations for Other Services     Frequency Min 3X/week    Precautions / Restrictions Precautions Precautions: Fall Restrictions Weight Bearing Restrictions: No   Pertinent Vitals/Pain 8/10 R LE with ambulation. PCA encouraged      Mobility  Bed Mobility Overal bed mobility: Modified Independent Transfers Overall transfer level: Needs assistance Transfers: Sit to/from Stand Sit to Stand: Min guard General transfer comment: close guard for safety Ambulation/Gait Ambulation/Gait assistance: Min guard Ambulation Distance (Feet): 125 Feet Assistive device:  (pt relied heavily on IV pole, bil UEs) Gait Pattern/deviations: Step-through pattern;Decreased stride length;Antalgic;Trunk flexed General Gait Details: very slow gait speed. Pt rates R LE pain 8/10 with activity. Pt declined use of walker-preferred to use IV pole and bil UEs for support.     Exercises     PT Diagnosis: Difficulty walking;Abnormality  of gait;Acute pain  PT Problem List: Decreased range of motion;Decreased activity tolerance;Decreased balance;Decreased mobility;Pain;Decreased knowledge of use of DME PT Treatment Interventions: DME instruction;Gait training;Functional mobility training;Therapeutic activities;Therapeutic exercise;Patient/family education;Balance training     PT Goals(Current goals can be found in the care plan section) Acute Rehab PT Goals Patient Stated Goal: less pain. home soon. PT Goal Formulation: With patient Time For Goal Achievement: 08/17/13 Potential to Achieve Goals: Good  Visit Information  Last PT Received On: 08/03/13 Assistance Needed: +1 History of Present Illness: 23 yo female admitted with sickle cell crisis. hx of chronic bck pain, Gerd, depression       Prior Functioning  Home Living Family/patient expects to be discharged to:: Private residence Living Arrangements: Alone Available Help at Discharge: Family Type of Home: Apartment Home Access: Stairs to enter Technical brewer of Steps: 4 Entrance Stairs-Rails: Right Home Layout: One level Home Equipment: Environmental consultant - 2 wheels Prior Function Level of Independence: Independent Communication Communication: No difficulties    Cognition  Cognition Arousal/Alertness: Awake/alert Behavior During Therapy: WFL for tasks assessed/performed Overall Cognitive Status: Within Functional Limits for tasks assessed    Extremity/Trunk Assessment Upper Extremity Assessment Upper Extremity Assessment: Overall WFL for tasks assessed Lower Extremity Assessment Lower Extremity Assessment: RLE deficits/detail RLE Deficits / Details: limited by pain in knee and hip area.  RLE: Unable to fully assess due to pain Cervical / Trunk Assessment Cervical / Trunk Assessment: Normal   Balance    End of Session PT - End of Session Activity Tolerance: Patient limited by pain Patient left: in bed;with call bell/phone within reach  GP      Weston Anna, MPT Pager: [phone]

## 2013-08-03 NOTE — Progress Notes (Addendum)
PHYSICIAN PROGRESS NOTE  STEPANIE GRAVER ASN:[id number] DOB: 25-Nov-1990 DOA: 07/30/2013  08/03/2013   PCP: MATTHEWS,MICHELLE A., MD  Admission History:  [name] is a 23 y.o. female who presents to the ED with sickle cell pain crisis. Pain located in legs, joints and back. Pain 10/10 and typical for her crisis symptoms. Home methadone and 10mg  oxycodone have not helped the symptoms. Pt was admitted for acute sickle cell pain crisis treatment.  Assessment/Plan: 1. Hb SS with crisis: Pt states that pain improving.  She is using less dilaudid in PCA. She is still having pain with ambulation. Encouraging further ambulation today.  She appears comfortable and resting well.  She has used less dilaudid in last 24 hours. PCA interrogation reporting 25.93 mg total with 43 demands and 35 deliveries. Continue Toradol and KVO IVF. Encourage ambulation today and incentive spirometry. Consulted physical therapy.  Will resume her home oxycodone in anticipation of her going home soon.  She takes 15 mg every 6 hours prn.  Will schedule oxycodone.  Will continue PCA in background at reduced dose for breakthru pain as she will be encouraged to ambulate more today.  Pt is already receiving her home long acting methadone every evening.  Monitor closely.   2. Leukocytosis: No evidence of infection. This is likely related to her crisis. Will continue to monitor.  3. Sickle Cell disease: Pt is on Hydrea and Folic Acid however her MCV is not compatible with compliance of Hydrea. Will continue counseling with patient about medication compliance. 4. Chronic Constipation - added miralax today, monitoring. Pt declines enema.  5. Tachycardia: Improved. Likely related to pain. No hypoxia.  6. Mild Hypokalemia: Repleted. Following CMP.   Code Status: Full Code  Family Communication: N/A  Disposition Plan: anticipate dc in next 1-2 days, likely tomorrow if stable  HPI/Subjective: Pt reports no BM but reports  improvement.   Objective: Filed Vitals:   08/03/13 0814  BP:   Pulse:   Temp:   Resp: 15    Intake/Output Summary (Last 24 hours) at 08/03/13 0829 Last data filed at 08/03/13 0543  Gross per 24 hour  Intake    720 ml  Output      0 ml  Net    720 ml   Filed Weights   07/30/13 1910 07/31/13 0402  Weight: 175 lb (79.379 kg) 176 lb 3.2 oz (79.924 kg)   Review of Systems:  As above otherwise all reviewed and reported negative  Exam:  General: Alert, drowsy but easily arousable, oriented x3, in no acute distress. Appears very comfortable.  HEENT: [place]/AT PEERL, EOMI  Neck: Trachea midline, no masses, no thyromegaly no JVD, no carotid bruit  OROPHARYNX: No exudate/ erythema/lesions.  Heart: RRR without murmurs, rubs, gallops.  Lungs: Clear to auscultation, no wheezing or rhonchi noted.  Abdomen: Soft, nontender, nondistended, positive bowel sounds, no masses no hepatosplenomegaly noted.  Neuro: No focal neurological deficits noted. Strength normal in bilateral upper and lower extremities.  Musculoskeletal: No warmth, swelling or erythema around joints.  Psychiatric: Patient alert and oriented x3.   Data Reviewed: Basic Metabolic Panel:  Recent Labs Lab 07/30/13 2220 08/02/13 0315 08/03/13 0332  NA 140 137 138  K 3.4* 3.5* 4.1  CL 103 100 101  CO2 22 26 26   GLUCOSE 107* 110* 100*  BUN 5* 3* 6  CREATININE 0.58 0.55 0.59  CALCIUM 9.4 8.9 8.9  MG  --  1.8 1.9   Liver Function Tests:  Recent Labs Lab  07/30/13 2220 08/02/13 0315 08/03/13 0332  AST 20 20 18   ALT 12 9 13   ALKPHOS 58 69 65  BILITOT 2.2* 2.7* 2.4*  PROT 8.1 6.5 6.5  ALBUMIN 4.4 3.6 3.5   No results found for this basename: LIPASE, AMYLASE,  in the last 168 hours No results found for this basename: AMMONIA,  in the last 168 hours CBC:  Recent Labs Lab 07/30/13 2220 08/02/13 0315 08/03/13 0332  WBC 18.7* 21.4* 21.2*  NEUTROABS  --  15.8*  --   HGB 9.8* 7.8* 7.4*  HCT 27.6* 22.2* 21.7*   MCV 87.3 88.1 90.8  PLT 563* 480* 476*   Cardiac Enzymes: No results found for this basename: CKTOTAL, CKMB, CKMBINDEX, TROPONINI,  in the last 168 hours BNP (last 3 results) No results found for this basename: PROBNP,  in the last 8760 hours CBG: No results found for this basename: GLUCAP,  in the last 168 hours  No results found for this or any previous visit (from the past 240 hour(s)).   Studies: No results found.  Scheduled Meds: . folic acid  7,824 mcg Oral q morning - 10a  . heparin  5,000 Units Subcutaneous 3 times per day  . HYDROmorphone PCA 0.3 mg/mL   Intravenous 6 times per day  . hydroxyurea  500 mg Oral BID  . ketorolac  30 mg Intravenous 4 times per day  . methadone  5 mg Oral QHS  . polyethylene glycol  17 g Oral Daily   Continuous Infusions: . dextrose 5 % and 0.45% NaCl 50 mL/hr at 08/02/13 1453   Principal Problem:   Hb-SS disease with crisis Active Problems:   Sickle cell crisis   Hypokalemia  [place] [employer] Pager [phone]. If 7PM-7AM, please contact night-coverage at [email or web address], password [facility] 08/03/2013, 8:29 AM  LOS: 4 days

## 2013-08-03 NOTE — Progress Notes (Signed)
Current IV leaking, patient requests IV team to restart, states difficult stick.

## 2013-08-04 LAB — CBC
HCT: 21.2 % — ABNORMAL LOW (ref 36.0–46.0)
Hemoglobin: 7.5 g/dL — ABNORMAL LOW (ref 12.0–15.0)
MCH: 31.4 pg (ref 26.0–34.0)
MCHC: 35.4 g/dL (ref 30.0–36.0)
MCV: 88.7 fL (ref 78.0–100.0)
Platelets: 498 10*3/uL — ABNORMAL HIGH (ref 150–400)
RBC: 2.39 MIL/uL — AB (ref 3.87–5.11)
RDW: 17.4 % — ABNORMAL HIGH (ref 11.5–15.5)
WBC: 15.1 10*3/uL — AB (ref 4.0–10.5)

## 2013-08-04 LAB — COMPREHENSIVE METABOLIC PANEL
ALK PHOS: 68 U/L (ref 39–117)
ALT: 15 U/L (ref 0–35)
AST: 18 U/L (ref 0–37)
Albumin: 3.9 g/dL (ref 3.5–5.2)
BUN: 9 mg/dL (ref 6–23)
CHLORIDE: 102 meq/L (ref 96–112)
CO2: 26 meq/L (ref 19–32)
Calcium: 9.3 mg/dL (ref 8.4–10.5)
Creatinine, Ser: 0.74 mg/dL (ref 0.50–1.10)
GFR calc Af Amer: >90 mL/min (ref >90)
GFR calc non Af Amer: >90 mL/min (ref >90)
Glucose, Bld: 104 mg/dL — ABNORMAL HIGH (ref 70–99)
Potassium: 4.1 mEq/L (ref 3.7–5.3)
SODIUM: 140 meq/L (ref 137–147)
Total Bilirubin: 2.5 mg/dL — ABNORMAL HIGH (ref 0.3–1.2)
Total Protein: 7.2 g/dL (ref 6.0–8.3)

## 2013-08-04 MED ORDER — OXYCODONE HCL 10 MG PO TABS: 15.0000 mg | ORAL_TABLET | Freq: Four times a day (QID) | ORAL | Status: DC | PRN

## 2013-08-04 NOTE — Discharge Instructions (Signed)
Return to the ED with any concerns including chest pain, fever/chills, abdominal pain, vomiting and not able to keep down liquids, decreased level of alertness/lethargy, or any other alarming symptoms  Sickle Cell Anemia, Adult Sickle cell anemia is a condition where your red blood cells are shaped like sickles. Red blood cells carry oxygen through the body. Sickle-shaped red blood cells cells do not live as long as normal red blood cells. They also clump together and block blood from flowing through the blood vessels. These things prevent the body from getting enough oxygen. Sickle cell anemia causes organ damage and pain. It also increases the risk of infection. HOME CARE  Drink enough fluid to keep your pee (urine) clear or pale yellow. Drink more in hot weather and during exercise.  Do not smoke. Smoking lowers oxygen levels in the blood.  Only take over-the-counter or prescription medicines as told by your doctor.  Take antibiotic medicines as told by your doctor. Make sure you finish them it even if you start to feel better.  Take supplements as told by your doctor.  Consider wearing a medical alert bracelet. This tells anyone caring for you in an emergency of your condition.  When traveling, keep your medical information, doctors' names, and the medicines you take with you at all times.  If you have a fever, do not take fever medicines right away. This could cover up a problem. Tell your doctor.   Keep all follow-up appointments with your doctor. Sickle cell anemia requires regular medical care. GET HELP IF: You have a fever. GET HELP RIGHT AWAY IF:  You feel dizzy or faint.  You have new belly (abdominal) pain, especially on the left side near the stomach area.  You have a lasting, often uncomfortable and painful erection of the penis (priapism). If it is not treated right away, you will become unable to have sex (impotence).  You have numbness your arms or legs or you have a  hard time moving them.  You have a hard time talking.  You have a fever or lasting symptoms for more than 2 3 days.  You have a fever and your symptoms suddenly get worse.  You have signs or symptoms of infection. These include:  Chills.  Being more tired than normal (lethargy).  Irritability.  Poor eating.  Throwing up (vomiting).  You have pain that is not helped with medicine.  You have shortness of breath.  You have pain in your chest.  You are coughing up pus-like or bloody mucus.  You have a stiff neck.  Your feet or hands swell or have pain.  Your belly looks bloated.  Your joints hurt. MAKE SURE YOU:  Understand these instructions.  Will watch your condition.  Will get help right away if you are not doing well or get worse. Document Released: 02/20/2013 Document Reviewed: 12/12/2012 [facility] Patient Information 2014 [place].  Constipation, Adult Constipation is when a person:  Poops (bowel movement) less than 3 times a week.  Has a hard time pooping.  Has poop that is dry, hard, or bigger than normal. HOME CARE   Eat more fiber, such as fruits, vegetables, whole grains like brown rice, and beans.  Eat less fatty foods and sugar. This includes [place] fries, hamburgers, cookies, candy, and soda.  If you are not getting enough fiber from food, take products with added fiber in them (supplements).  Drink enough fluid to keep your pee (urine) clear or pale yellow.  Go to  the restroom when you feel like you need to poop. Do not hold it.  Only take medicine as told by your doctor. Do not take medicines that help you poop (laxatives) without talking to your doctor first.  Exercise on a regular basis, or as told by your doctor. GET HELP RIGHT AWAY IF:   You have bright red blood in your poop (stool).  Your constipation lasts more than 4 days or gets worse.  You have belly (abdomen) or butt (rectal) pain.  You have thin poop (as thin  as a pencil).  You lose weight, and it cannot be explained. MAKE SURE YOU:   Understand these instructions.  Will watch your condition.  Will get help right away if you are not doing well or get worse. Document Released: 10/19/2007 Document Revised: 07/25/2011 Document Reviewed: 02/11/2013 [facility] Patient Information 2014 [place], [place].

## 2013-08-04 NOTE — Progress Notes (Signed)
08/04/13 1730  Reviewed discharge instructions with patient. Patient verbalized understanding. Copy of discharge and prescription given to patient.

## 2013-08-04 NOTE — Discharge Summary (Signed)
PHYSICIAN DISCHARGE SUMMARY   [name] RFF:[id number] DOB: 06-01-1990 DOA: 07/30/2013  PCP: MATTHEWS,MICHELLE A., MD  Admit date: 07/30/2013 Discharge date: 08/04/2013  Time spent: 33 minutes  Recommendations for Outpatient Follow-up:  1. Please consider checking CBC and CMP on followup   Discharge Diagnoses:  Principal Problem:   Hb-SS disease with crisis Active Problems:   Sickle cell crisis   Hypokalemia  Discharge Condition: stable   Diet recommendation: general  Filed Weights   07/30/13 1910 07/31/13 0402  Weight: 175 lb (79.379 kg) 176 lb 3.2 oz (79.924 kg)   History of present illness:  [name] is a 23 y.o. female who presents to the ED with sickle cell pain crisis. Pain located in legs, joints and back. Pain 10/10 and typical for her crisis symptoms. Home methadone and 10mg  oxycodone have not helped the symptoms. Pt was admitted for acute sickle cell pain crisis treatment.   Hospital Course:  1. Hb SS with crisis: Pt states that pain improving and she feels that she can manage pain at home with her home meds.  She says that she needs prescription for her home oxycodone.  She called in to Dr. Zigmund Daniel office for a refill last week but has not been able to pick up because she has been hospitalized.   Consulted physical therapy.  She takes 15 mg every 6 hours prn. Will give her 2 day supply of her oxycodone and have her call Dr. Zigmund Daniel office tomorrow to pick up her regular prescription. She has her methadone at home to take.  2. Leukocytosis: No evidence of infection. This is likely related to her crisis.   3. Sickle Cell disease: Pt is on Hydrea and Folic Acid however her MCV is not compatible with compliance of Hydrea. Will continue counseling with patient about medication compliance and smoking cessation. 4. Chronic Constipation - added miralax today, monitoring. Pt declines enema.  5. Tachycardia: Improved. Likely related to pain. No hypoxia.  6. Mild  Hypokalemia: Repleted.  Consultations:  Physical Therapy  Discharge Exam: Pt is eating and drinking well.  No distress.  Ambulating without assistance.  Pt says she believes she can manage her pain at home with her home pain meds.   Filed Vitals:   08/04/13 0548  BP: 98/65  Pulse: 91  Temp: 98.1 F (36.7 C)  Resp: 14    General: Alert, drowsy but easily arousable, oriented x3, in no acute distress. Appears very comfortable.  HEENT: [place]/AT PEERL, EOMI  Neck: Trachea midline, no masses, no thyromegaly no JVD, no carotid bruit  OROPHARYNX: No exudate/ erythema/lesions.  Heart: RRR without murmurs, rubs, gallops.  Lungs: Clear to auscultation, no wheezing or rhonchi noted.  Abdomen: Soft, nontender, nondistended, positive bowel sounds, no masses no hepatosplenomegaly noted.  Neuro: No focal neurological deficits noted. Strength normal in bilateral upper and lower extremities.  Musculoskeletal: No warmth, swelling or erythema around joints.  Psychiatric: Patient alert and oriented x3.   Discharge Instructions  Discharge Orders   Future Orders Complete By Expires   Discharge instructions  As directed    Comments:     Call Dr. Zigmund Daniel office tomorrow to get your pain medication prescription picked up. Return if symptoms recur, worsen or new problems develop.   Discontinue IV  As directed    Increase activity slowly  As directed        Medication List         folic acid 357 MCG tablet  Commonly known as:  FOLVITE  Take 2 tablets (1,600 mcg total) by mouth every morning.     hydroxyurea 500 MG capsule  Commonly known as:  HYDREA  Take 500 mg by mouth 2 (two) times daily. May take with food to minimize GI side effects.     methadone 5 MG tablet  Commonly known as:  DOLOPHINE  Take 1 tablet (5 mg total) by mouth at bedtime.     Oxycodone HCl 10 MG Tabs  Take 1.5 tablets (15 mg total) by mouth every 6 (six) hours as needed (pain).       Allergies  Allergen Reactions   . Carrot Oil Hives and Swelling  . Latex Rash       Follow-up Information   Follow up with MATTHEWS,MICHELLE A., MD. Schedule an appointment as soon as possible for a visit in 2 days. [facility] Followup and pick up prescriptions)    Specialty:  Internal Medicine   Contact information:   [place] [id number] [phone]      The results of significant diagnostics from this hospitalization (including imaging, microbiology, ancillary and laboratory) are listed below for reference.    Significant Diagnostic Studies: Dg Chest 2 View  07/30/2013   CLINICAL DATA:  Sickle cell crisis, cough, congestion  EXAM: CHEST  2 VIEW  COMPARISON:  DG CHEST 2 VIEW dated 04/28/2013  FINDINGS: Heart size and vascular pattern are normal. There is no consolidation or effusion. There is a thin band of linear density in the right upper lobe most consistent with scarring. It is stable.  IMPRESSION: Negative for acute abnormalities.   Electronically Signed   By: Skipper Cliche M.D.   On: 07/30/2013 21:56   Dg Ankle Complete Right  07/16/2013   CLINICAL DATA:  Foot pain after trauma  EXAM: RIGHT ANKLE - COMPLETE 3+ VIEW  COMPARISON:  None.  FINDINGS: No evidence of ankle fracture or malalignment. There is slight irregularity at the base of the fifth metatarsal, indeterminate. No joint narrowing. Cannot exclude an ankle effusion.  IMPRESSION: 1. Negative for ankle fracture malalignment. 2. Subtle irregularity of the fifth metatarsal base. If focal tenderness in this location, recommend dedicated foot radiography to evaluate for avulsion fracture.   Electronically Signed   By: Jorje Guild M.D.   On: 07/16/2013 03:38   Dg Foot Complete Right  07/16/2013   CLINICAL DATA:  Foot pain.  Difficult exam.  EXAM: RIGHT FOOT COMPLETE - 3+ VIEW  COMPARISON:  None.  FINDINGS: There is mild soft tissue swelling anterior to the ankle. No acute fracture or malalignment. No significant or focal joint  narrowing.  IMPRESSION: No acute osseous abnormality.   Electronically Signed   By: Jorje Guild M.D.   On: 07/16/2013 04:31    Microbiology: No results found for this or any previous visit (from the past 240 hour(s)).   Labs: Basic Metabolic Panel:  Recent Labs Lab 07/30/13 2220 08/02/13 0315 08/03/13 0332 08/04/13 0606  NA 140 137 138 140  K 3.4* 3.5* 4.1 4.1  CL 103 100 101 102  CO2 22 26 26 26   GLUCOSE 107* 110* 100* 104*  BUN 5* 3* 6 9  CREATININE 0.58 0.55 0.59 0.74  CALCIUM 9.4 8.9 8.9 9.3  MG  --  1.8 1.9  --    Liver Function Tests:  Recent Labs Lab 07/30/13 2220 08/02/13 0315 08/03/13 0332 08/04/13 0606  AST 20 20 18 18   ALT 12 9 13 15   ALKPHOS 58  69 65 68  BILITOT 2.2* 2.7* 2.4* 2.5*  PROT 8.1 6.5 6.5 7.2  ALBUMIN 4.4 3.6 3.5 3.9   No results found for this basename: LIPASE, AMYLASE,  in the last 168 hours No results found for this basename: AMMONIA,  in the last 168 hours CBC:  Recent Labs Lab 07/30/13 2220 08/02/13 0315 08/03/13 0332 08/04/13 0606  WBC 18.7* 21.4* 21.2* 15.1*  NEUTROABS  --  15.8*  --   --   HGB 9.8* 7.8* 7.4* 7.5*  HCT 27.6* 22.2* 21.7* 21.2*  MCV 87.3 88.1 90.8 88.7  PLT 563* 480* 476* 498*   Cardiac Enzymes: No results found for this basename: CKTOTAL, CKMB, CKMBINDEX, TROPONINI,  in the last 168 hours BNP: BNP (last 3 results) No results found for this basename: PROBNP,  in the last 8760 hours CBG: No results found for this basename: GLUCAP,  in the last 168 hours  Signed:  Henrique Parekh  [facility] 08/04/2013, 8:23 AM

## 2013-08-06 MED ORDER — OXYCODONE HCL 10 MG PO TABS: 15.0000 mg | ORAL_TABLET | Freq: Four times a day (QID) | ORAL | Status: DC | PRN

## 2013-08-06 NOTE — Telephone Encounter (Signed)
Prescribed Oxycodone HCI 10 mg. Take 1.5  Tablets by mouth every 6 hours as needed for pain. #90 tablets.

## 2013-08-07 ENCOUNTER — Ambulatory Visit: Payer: Medicare Other | Admitting: Family Medicine

## 2013-08-14 ENCOUNTER — Telehealth: Payer: Self-pay | Admitting: Internal Medicine

## 2013-08-14 NOTE — Discharge Summary (Signed)
Pt seen and examined and discussed with NP Cammie Sickle. Agree with discharge home.
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
## 2013-08-15 ENCOUNTER — Telehealth (HOSPITAL_COMMUNITY): Payer: Self-pay | Admitting: Hematology

## 2013-08-15 ENCOUNTER — Ambulatory Visit (INDEPENDENT_AMBULATORY_CARE_PROVIDER_SITE_OTHER): Payer: Medicare Other | Admitting: Family Medicine

## 2013-08-15 ENCOUNTER — Encounter (HOSPITAL_COMMUNITY): Payer: Self-pay | Admitting: *Deleted

## 2013-08-15 ENCOUNTER — Non-Acute Institutional Stay (HOSPITAL_COMMUNITY)
Admission: AD | Admit: 2013-08-15 | Discharge: 2013-08-15 | Disposition: A | Discharge status: Home / Self Care | Payer: Medicare Other | Source: Ambulatory Visit | Attending: Internal Medicine | Admitting: Internal Medicine

## 2013-08-15 VITALS — BP 103/36 | HR 95 | Temp 98.3°F | Resp 20 | Wt 169.0 lb

## 2013-08-15 DIAGNOSIS — R1031 Right lower quadrant pain: Secondary | ICD-10-CM

## 2013-08-15 DIAGNOSIS — D571 Sickle-cell disease without crisis: Secondary | ICD-10-CM

## 2013-08-15 DIAGNOSIS — Z79899 Other long term (current) drug therapy: Secondary | ICD-10-CM | POA: Diagnosis not present

## 2013-08-15 DIAGNOSIS — L02219 Cutaneous abscess of trunk, unspecified: Secondary | ICD-10-CM | POA: Diagnosis not present

## 2013-08-15 DIAGNOSIS — M25531 Pain in right wrist: Secondary | ICD-10-CM

## 2013-08-15 DIAGNOSIS — L03319 Cellulitis of trunk, unspecified: Secondary | ICD-10-CM | POA: Diagnosis not present

## 2013-08-15 DIAGNOSIS — K219 Gastro-esophageal reflux disease without esophagitis: Secondary | ICD-10-CM | POA: Diagnosis not present

## 2013-08-15 DIAGNOSIS — L02214 Cutaneous abscess of groin: Secondary | ICD-10-CM | POA: Diagnosis present

## 2013-08-15 DIAGNOSIS — R110 Nausea: Secondary | ICD-10-CM | POA: Diagnosis not present

## 2013-08-15 DIAGNOSIS — R5381 Other malaise: Secondary | ICD-10-CM | POA: Insufficient documentation

## 2013-08-15 DIAGNOSIS — R5383 Other fatigue: Secondary | ICD-10-CM

## 2013-08-15 DIAGNOSIS — M25539 Pain in unspecified wrist: Secondary | ICD-10-CM

## 2013-08-15 DIAGNOSIS — R109 Unspecified abdominal pain: Secondary | ICD-10-CM | POA: Diagnosis not present

## 2013-08-15 DIAGNOSIS — F17200 Nicotine dependence, unspecified, uncomplicated: Secondary | ICD-10-CM | POA: Insufficient documentation

## 2013-08-15 LAB — CBC WITH DIFFERENTIAL/PLATELET
Basophils Absolute: 0 10*3/uL (ref 0.0–0.1)
Basophils Relative: 0 % (ref 0–1)
Eosinophils Absolute: 0.2 10*3/uL (ref 0.0–0.7)
Eosinophils Relative: 1 % (ref 0–5)
HCT: 27.4 % — ABNORMAL LOW (ref 36.0–46.0)
Hemoglobin: 9.4 g/dL — ABNORMAL LOW (ref 12.0–15.0)
Lymphocytes Relative: 17 % (ref 12–46)
Lymphs Abs: 2.7 10*3/uL (ref 0.7–4.0)
MCH: 30.3 pg (ref 26.0–34.0)
MCHC: 34.3 g/dL (ref 30.0–36.0)
MCV: 88.4 fL (ref 78.0–100.0)
MONO ABS: 1.4 10*3/uL — AB (ref 0.1–1.0)
Monocytes Relative: 9 % (ref 3–12)
NEUTROS PCT: 73 % (ref 43–77)
Neutro Abs: 11.3 10*3/uL — ABNORMAL HIGH (ref 1.7–7.7)
PLATELETS: 619 10*3/uL — AB (ref 150–400)
RBC: 3.1 MIL/uL — ABNORMAL LOW (ref 3.87–5.11)
RDW: 17.1 % — ABNORMAL HIGH (ref 11.5–15.5)
WBC: 15.6 10*3/uL — ABNORMAL HIGH (ref 4.0–10.5)

## 2013-08-15 LAB — COMPREHENSIVE METABOLIC PANEL
ALBUMIN: 4.5 g/dL (ref 3.5–5.2)
ALT: 11 U/L (ref 0–35)
AST: 18 U/L (ref 0–37)
Alkaline Phosphatase: 60 U/L (ref 39–117)
BILIRUBIN TOTAL: 2 mg/dL — AB (ref 0.3–1.2)
BUN: 4 mg/dL — ABNORMAL LOW (ref 6–23)
CO2: 23 mEq/L (ref 19–32)
Calcium: 9.7 mg/dL (ref 8.4–10.5)
Chloride: 102 mEq/L (ref 96–112)
Creatinine, Ser: 0.61 mg/dL (ref 0.50–1.10)
GFR calc non Af Amer: >90 mL/min (ref >90)
GLUCOSE: 86 mg/dL (ref 70–99)
Potassium: 4 mEq/L (ref 3.7–5.3)
Sodium: 140 mEq/L (ref 137–147)
Total Protein: 8.2 g/dL (ref 6.0–8.3)

## 2013-08-15 LAB — RETICULOCYTES
RBC.: 3.1 MIL/uL — AB (ref 3.87–5.11)
Retic Count, Absolute: 517.7 10*3/uL — ABNORMAL HIGH (ref 19.0–186.0)
Retic Ct Pct: 16.7 % — ABNORMAL HIGH (ref 0.4–3.1)

## 2013-08-15 MED ORDER — HYDROMORPHONE HCL PF 2 MG/ML IJ SOLN
1.9000 mg | Freq: Once | INTRAMUSCULAR | Status: AC
  Administered 2013-08-15: 1.9 mg via INTRAVENOUS
  Filled 2013-08-15: qty 1

## 2013-08-15 MED ORDER — CEPHALEXIN 500 MG PO CAPS: 500.0000 mg | ORAL_CAPSULE | Freq: Two times a day (BID) | ORAL | Status: AC

## 2013-08-15 MED ORDER — DEXTROSE-NACL 5-0.45 % IV SOLN
INTRAVENOUS | Status: DC
  Administered 2013-08-15: 15:00:00 via INTRAVENOUS

## 2013-08-15 MED ORDER — FOLIC ACID 1 MG PO TABS
1.0000 mg | ORAL_TABLET | Freq: Every day | ORAL | Status: DC
  Administered 2013-08-15: 1 mg via ORAL
  Filled 2013-08-15: qty 1

## 2013-08-15 MED ORDER — HYDROMORPHONE HCL PF 2 MG/ML IJ SOLN
2.3000 mg | Freq: Once | INTRAMUSCULAR | Status: AC
  Administered 2013-08-15: 2.3 mg via INTRAVENOUS
  Filled 2013-08-15: qty 2

## 2013-08-15 MED ORDER — CEPHALEXIN 500 MG PO CAPS
500.0000 mg | ORAL_CAPSULE | Freq: Two times a day (BID) | ORAL | Status: DC
  Administered 2013-08-15: 500 mg via ORAL
  Filled 2013-08-15 (×2): qty 1

## 2013-08-15 MED ORDER — ONDANSETRON HCL 4 MG/2ML IJ SOLN
4.0000 mg | Freq: Once | INTRAMUSCULAR | Status: AC
  Administered 2013-08-15: 4 mg via INTRAVENOUS
  Filled 2013-08-15: qty 2

## 2013-08-15 MED ORDER — KETOROLAC TROMETHAMINE 30 MG/ML IJ SOLN
30.0000 mg | Freq: Once | INTRAMUSCULAR | Status: AC
  Administered 2013-08-15: 30 mg via INTRAVENOUS
  Filled 2013-08-15: qty 1

## 2013-08-15 MED ORDER — HYDROXYUREA 500 MG PO CAPS: 500.0000 mg | ORAL_CAPSULE | Freq: Every day | ORAL | Status: DC

## 2013-08-15 MED ORDER — OXYCODONE HCL 10 MG PO TABS: 15.0000 mg | ORAL_TABLET | Freq: Four times a day (QID) | ORAL | Status: DC | PRN

## 2013-08-15 MED ORDER — HYDROMORPHONE HCL PF 2 MG/ML IJ SOLN
2.0000 mg | Freq: Once | INTRAMUSCULAR | Status: AC
  Administered 2013-08-15: 2 mg via INTRAVENOUS
  Filled 2013-08-15: qty 1

## 2013-08-15 MED ORDER — HYDROXYUREA 500 MG PO CAPS: 500.0000 mg | ORAL_CAPSULE | Freq: Two times a day (BID) | ORAL | Status: DC

## 2013-08-15 NOTE — Telephone Encounter (Signed)
Called patient back and advised that per Dr. Jonelle Sidle, she needs to be seen on the office side.  Appointment made for patient today @ 11:30 with [place], NP.  Patient verbalizes understanding.

## 2013-08-15 NOTE — Discharge Instructions (Signed)
Sickle Cell Anemia, Adult °Sickle cell anemia is a condition in which red blood cells have an abnormal "sickle" shape. This abnormal shape shortens the cells' life span, which results in a lower than normal concentration of red blood cells in the blood. The sickle shape also causes the cells to clump together and block free blood flow through the blood vessels. As a result, the tissues and organs of the body do not receive enough oxygen. Sickle cell anemia causes organ damage and pain and increases the risk of infection. °CAUSES  °Sickle cell anemia is a genetic disorder. Those who receive two copies of the gene have the condition, and those who receive one copy have the trait. °RISK FACTORS °The sickle cell gene is most common in people whose families originated in [place]. Other areas of the globe where sickle cell trait occurs include the [place], [place] and [place], the [place], and the [place].  °SIGNS AND SYMPTOMS °· Pain, especially in the extremities, back, chest, or abdomen (common). The pain may start suddenly or may develop following an illness, especially if there is dehydration. Pain can also occur due to overexertion or exposure to extreme temperature changes. °· Frequent severe bacterial infections, especially certain types of pneumonia and meningitis. °· Pain and swelling in the hands and feet. °· Decreased activity.   °· Loss of appetite.   °· Change in behavior. °· Headaches. °· Seizures. °· Shortness of breath or difficulty breathing. °· Vision changes. °· Skin ulcers. °Those with the trait may not have symptoms or they may have mild symptoms.  °DIAGNOSIS  °Sickle cell anemia is diagnosed with blood tests that demonstrate the genetic trait. It is often diagnosed during the newborn period, due to mandatory testing nationwide. A variety of blood tests, X-rays, CT scans, MRI scans, ultrasounds, and lung function tests may also be done to monitor the condition. °TREATMENT  °Sickle  cell anemia may be treated with: °· Medicines. You may be given pain medicines, antibiotic medicines (to treat and prevent infections) or medicines to increase the production of certain types of hemoglobin. °· Fluids. °· Oxygen. °· Blood transfusions. °HOME CARE INSTRUCTIONS  °· Drink enough fluid to keep your urine clear or pale yellow. Increase your fluid intake in hot weather and during exercise. °· Do not smoke. Smoking lowers oxygen levels in the blood.   °· Only take over-the-counter or prescription medicines for pain, fever, or discomfort as directed by your health care provider. °· Take antibiotics as directed by your health care provider. Make sure you finish them it even if you start to feel better.   °· Take supplements as directed by your health care provider.   °· Consider wearing a medical alert bracelet. This tells anyone caring for you in an emergency of your condition.   °· When traveling, keep your medical information, health care provider's names, and the medicines you take with you at all times.   °· If you develop a fever, do not take medicines to reduce the fever right away. This could cover up a problem that is developing. Notify your health care provider. °· Keep all follow-up appointments with your health care provider. Sickle cell anemia requires regular medical care. °SEEK MEDICAL CARE IF: ° You have a fever. °SEEK IMMEDIATE MEDICAL CARE IF:  °· You feel dizzy or faint.   °· You have new abdominal pain, especially on the left side near the stomach area.   °· You develop a persistent, often uncomfortable and painful penile erection (priapism). If this is not treated immediately it   will lead to impotence.   °· You have numbness your arms or legs or you have a hard time moving them.   °· You have a hard time with speech.   °· You have a fever or persistent symptoms for more than 2 3 days.   °· You have a fever and your symptoms suddenly get worse.   °· You have signs or symptoms of infection.  These include:   °· Chills.   °· Abnormal tiredness (lethargy).   °· Irritability.   °· Poor eating.   °· Vomiting.   °· You develop pain that is not helped with medicine.   °· You develop shortness of breath. °· You have pain in your chest.   °· You are coughing up pus-like or bloody sputum.   °· You develop a stiff neck. °· Your feet or hands swell or have pain. °· Your abdomen appears bloated. °· You develop joint pain. °MAKE SURE YOU: °· Understand these instructions. °· Will watch your child's condition. °· Will get help right away if your child is not doing well or gets worse. °Document Released: 08/10/2005 Document Revised: 02/20/2013 Document Reviewed: 12/12/2012 °Exit[employer]® Patient Information ©2014 [employer]. ° °

## 2013-08-15 NOTE — Progress Notes (Signed)
[name] History and Physical  [name] PPI:[id number] DOB: [birth year] DOA: 08/15/2013   PCP: MATTHEWS,MICHELLE A., MD   Chief Complaint: Sickle cell pain  HPI: Patient was seen in primary care for an acute visit. Reports that she started having increased groin pain that began after working an 8 hour shift at [employer] on 08/14/2013. She had to leave work early this am due to increasing pain that was unable to be managed by current pain regimen.  Pain is currently 8/10 in intensity, throbbing, and localized to right groin. Also, patient reports 6/10 pain to right wrist that is consistent with past sickle cell pain crisis.   Patient maintains she has nausea and experienced one episode of vomiting this am. Pain in unrelieved by Oxycodone last taken around 6 am, with minimal relief.  She reports fatigue and nausea, denies headache, dizziness, shortness of breath, and chest pains. Patient will be admitted to day hospital for pain management and extended observation.    Review of Systems:  Constitutional: No weight loss, night sweats, Fevers, chills.  Reports fatigue HEENT: No headaches, dizziness, seizures, vision changes, difficulty swallowing,Tooth/dental problems,Sore throat, No sneezing, itching, ear ache, nasal congestion, post nasal drip,  Cardio-vascular: No chest pain, Orthopnea, PND, swelling in lower extremities, anasarca, dizziness, palpitations  GI: No heartburn, indigestion, abdominal pain, vomiting, diarrhea, change in bowel habit. Reports nausea and loss of appetite  Resp: No shortness of breath with exertion or at rest. No excess mucus, no productive cough, No non-productive cough, No coughing up of blood.No change in color of mucus.No wheezing.No chest wall deformity  Skin: no rash or lesions.  GU: no dysuria, change in color of urine, no urgency or frequency. No flank pain.  Musculoskeletal: No joint pain or swelling. No decreased range of motion. No back pain.   Psych: No change in mood or affect. No depression or anxiety. No memory loss.    Past Medical History  Diagnosis Date  . H/O: 1 miscarriage 03/22/2011    Pt reports 2 miscarriages.  [name] 01/08/2009  . Depression 01/06/2011  . GERD (gastroesophageal reflux disease) 02/17/2011  . Trichotillomania     h/o  . Blood transfusion     "lots"  . Sickle cell anemia with crisis   . Exertional dyspnea     "sometimes"  . Sickle cell anemia   . Headache(784.0)   . Migraines 11/08/11    "@ least twice/month"  . Chronic back pain     "very severe; have knot in my back; from tight muscle; take RX and exercise for it"  . Mood swings 11/08/11    "I go back and forth; real bad"  . Pregnancy   . Blood dyscrasia     SICKLE CELL   Past Surgical History  Procedure Laterality Date  . Cholecystectomy  05/2010  . Dilation and curettage of uterus  02/20/11    S/P miscarriage   Social History:  reports that she has been smoking Cigarettes.  She has a .25 pack-year smoking history. She has never used smokeless tobacco. She reports that she drinks alcohol. She reports that she does not use illicit drugs.  Allergies  Allergen Reactions  . Carrot Oil Hives and Swelling  . Latex Rash    Family History  Problem Relation Age of Onset  . Diabetes Mother   . Alcoholism    . Depression    . Hypercholesterolemia    . Hypertension    . Migraines    .  Diabetes Maternal Grandmother   . Diabetes Paternal Grandmother     Prior to Admission medications   Medication Sig Start Date End Date Taking? Authorizing Provider  folic acid (FOLVITE) 562 MCG tablet Take 2 tablets (1,600 mcg total) by mouth every morning. 02/26/13   [name], MD  hydroxyurea (HYDREA) 500 MG capsule Take 500 mg by mouth 2 (two) times daily. May take with food to minimize GI side effects.    Historical Provider, MD  methadone (DOLOPHINE) 5 MG tablet Take 1 tablet (5 mg total) by mouth at bedtime. 06/01/13   Elwyn Reach, MD  Oxycodone HCl 10 MG TABS Take 1.5 tablets (15 mg total) by mouth every 6 (six) hours as needed (pain). 08/06/13   Dorena Dew, FNP   Physical Exam: There were no vitals filed for this visit. BP 106/69  Pulse 98  Temp(Src) 98.4 F (36.9 C) (Oral)  Resp 18  SpO2 99%  LMP 08/01/2013 General appearance: alert, cooperative, fatigued and mild distress Head: Normocephalic, without obvious abnormality, atraumatic Eyes: conjunctivae/corneas clear. PERRL, EOM's intact. Fundi benign. Ears: normal TM's and external ear canals both ears Nose: Nares normal. Septum midline. Mucosa normal. No drainage or sinus tenderness. Throat: lips, mucosa, and tongue appear dry; teeth and gums normal Neck: no adenopathy, no carotid bruit, no JVD, supple, symmetrical, trachea midline and thyroid not enlarged, symmetric, no tenderness/mass/nodules Back: symmetric, no curvature. ROM normal. No CVA tenderness. Lungs: clear to auscultation bilaterally Heart: regular rate and rhythm, S1, S2 normal, no murmur, click, rub or gallop Abdomen: soft, non-tender; bowel sounds normal; no masses,  no organomegaly Pelvic: cervix normal in appearance, external genitalia normal and Right groin tender to palpation, fullness, round, raised, movable,  mildly erythematous and warm to touch Extremities: extremities normal, atraumatic, no cyanosis or edema. Right lateral wrist tender to palpation Pulses: 2+ and symmetric Skin: Skin color, texture, turgor normal. No rashes or lesions or erythema - groin Lymph nodes: Cervical, supraclavicular, and axillary nodes normal. Neurologic: Grossly normal  Labs on Admission:   Basic Metabolic Panel: No results found for this basename: NA, K, CL, CO2, GLUCOSE, BUN, CREATININE, CALCIUM, MG, PHOS,  in the last 168 hours Liver Function Tests: No results found for this basename: AST, ALT, ALKPHOS, BILITOT, PROT, ALBUMIN,  in the last 168 hours CBC: No results found for this  basename: WBC, NEUTROABS, HGB, HCT, MCV, PLT,  in the last 168 hours BNP: No components found with this basename: POCBNP,    Radiological Exams on Admission: No results found.  EKG: Independently reviewed.   Assessment/Plan: Active Problems:   Sickle cell anemia with pain   Right groin pain   Abscess of right groin  1. Sickle cell pain: Admit to day hospital for extended observation.  Patient appears mildly dehydrated, will start hypotonic IV fluids. Initiate IV Toradol and IV dilaudid via weight based rapid re-dosing. If pain remains greater than 7/10, will transition to IV dilaudid PCA per weight based protocol. Will check CBC w/diff, CMP, and reticulocyte count. Will continue home medications Hydrea 130 mg and Folic acid 865 mcg.  2. Abscess of right groin: Will give initial dose of Keflex 500 mg. Apply warm compress to affected area. 3. Nausea: Will give IV Zofran 4 mg and IVFs  4. Fatigue: Will check CBC w/diff and CMP  Time spend: 30 Code Status: Full Family Communication: None Disposition Plan: Home when stable  Dorena Dew, FNP  Pager [phone]  If 7PM-7AM, please contact night-coverage  [email or web address] Password [email or web address] 08/15/2013, 1:26 PM

## 2013-08-15 NOTE — Progress Notes (Signed)
   Subjective:    Patient ID: [name], female    DOB: 08/09/1990, 23 y.o.   MRN: [id number]  HPI Patient in office complaining of right groin pain. Patient maintains that pain began initially after working an 8 hour shift at [employer] on 08/14/2013.  Reports 7-8/10 pain to groin that is currently interfering with ambulation. Pain is described as constant, throbbing, localized, and non radiating. Reports that she went to work this am an had to leave early due to pain and nausea. Patient had a vomiting episode that occurred this am.   Also, patient complaining of right wrist pain that is consistent with previous sickle cell pain. Pain is described as 6-7/10 intensity, that is constant and aching. Reports that pain is worsened by activity and minimally relieved with current pain management regimen.    Review of Systems  Constitutional: Positive for fatigue. Negative for fever.  HENT: Negative.   Eyes: Negative.   Respiratory: Negative.   Cardiovascular: Negative.   Gastrointestinal: Positive for nausea and abdominal pain (right groin pain).  Endocrine: Negative.   Musculoskeletal: Positive for myalgias.  Skin: Negative.   Allergic/Immunologic: Negative.   Neurological: Negative for dizziness and headaches.  Hematological: Negative.   Psychiatric/Behavioral: The patient is not nervous/anxious.        Objective:   Physical Exam  Constitutional: She appears well-developed and well-nourished.  HENT:  Head: Normocephalic and atraumatic.  Mouth/Throat: Mucous membranes are dry.  Eyes: Pupils are equal, round, and reactive to light.  Neck: Normal range of motion.  Cardiovascular: Normal rate and normal heart sounds.   Pulmonary/Chest: Effort normal and breath sounds normal.  Abdominal: Soft. Bowel sounds are normal. No hernia.  Musculoskeletal:       Right wrist: She exhibits decreased range of motion and tenderness.  Lymphadenopathy:       Right: Inguinal adenopathy present.   Skin: Skin is warm, dry and intact. Rash noted. There is erythema.  1 cm Round, raised, movable, erythematous nodule to right groin. Warm and tender to palpation  Psychiatric: She has a normal mood and affect. Her speech is normal.          Assessment & Plan:  1. Sickle cell anemia: Patient states that she is have pain to right groin that is 8-10 in intensity, unrelieved by Oxycodone 10 mg last taken at 6 am prior to going to work. Patient notified office for a visit because she felt as if she was unable to manage at home. Reports that she has not taken Hydrea or folic acid today. Patient appears mildly dehydrated.  Will admit patient to sickle cell day hospital for extended observation 2. Fatigue: Will obtain cbc w/differential, cmp, and reticulocyte count at at sickle cell day hospital 3. Nausea alone: Patient reports that last vomiting episode was at 7:30 this am. Will treat nausea at day hospital with IV anti-emetics 4. Right groin pain/abscess: Start Keflex 500 mg twice daily for 10 days and apply warm moist compresses 4 times daily as needed

## 2013-08-15 NOTE — Telephone Encounter (Signed)
Patient called in C/O of no feeling good and weakness to her right arm and leg.  Per patient she has vomiting x1.  She also complains of pain that is 6/10 to ribs, groin, and right leg.  Patient states she took a pain pill last night without any relief and a hot shower this morning.  I advised I would notify the physician and give her a call back.  Patient verbalizes understanding.

## 2013-08-15 NOTE — Discharge Summary (Signed)
[facility] Discharge Summary   Patient ID: [name] MRN: [id number] DOB/AGE: Dec 28, 1990 23 y.o.  Admit date: 08/15/2013 Discharge date: 08/15/2013  Primary Care Physician:  MATTHEWS,MICHELLE A., MD  Admission Diagnoses:  Active Problems:   Sickle cell anemia with pain   Right groin pain   Abscess of right groin   Discharge Diagnoses:   Sickle cell anemia with pain Right groin abscess  Discharge Medications:    Medication List         cephALEXin 500 MG capsule  Commonly known as:  KEFLEX  Take 1 capsule (500 mg total) by mouth 2 (two) times daily.     folic acid 675 MCG tablet  Commonly known as:  FOLVITE  Take 2 tablets (1,600 mcg total) by mouth every morning.     hydroxyurea 500 MG capsule  Commonly known as:  HYDREA  Take 500 mg by mouth 2 (two) times daily. May take with food to minimize GI side effects.     methadone 5 MG tablet  Commonly known as:  DOLOPHINE  Take 1 tablet (5 mg total) by mouth at bedtime.     Oxycodone HCl 10 MG Tabs  Take 1.5 tablets (15 mg total) by mouth every 6 (six) hours as needed (pain).         Consults:  None  Significant Diagnostic Studies:  Dg Chest 2 View  07/30/2013   CLINICAL DATA:  Sickle cell crisis, cough, congestion  EXAM: CHEST  2 VIEW  COMPARISON:  DG CHEST 2 VIEW dated 04/28/2013  FINDINGS: Heart size and vascular pattern are normal. There is no consolidation or effusion. There is a thin band of linear density in the right upper lobe most consistent with scarring. It is stable.  IMPRESSION: Negative for acute abnormalities.   Electronically Signed   By: Skipper Cliche M.D.   On: 07/30/2013 21:56     [facility] Course: 1. Sickle cell pain: Initiated IV Toradol and IV dilaudid per weight based rapid re-dosing times 4 doses. Pain intensity decreased from 8/10 to 2/10. Patient is functional and reports that she can manage pain  at home on current regimen. Patient given Rx for  Oxycodone 10 mg, take 1.5 tablets every 6 hours for pain # 90. Rx not to be filled until 08/21/2013 2. Right groin abscess: Patient given initial dose of Keflex 500 mg and will be given an Rx for Keflex 500 mg twice daily for 10 days. Right groin abscess improved after applying heating pad 2. Nausea relieved by Zofran 4 mg IV 3. Fatigue: Patient maintains that fatigue has decreased    Physical Exam at Discharge:  BP 106/69  Pulse 98  Temp(Src) 98.4 F (36.9 C) (Oral)  Resp 18  SpO2 99%  LMP 08/01/2013    Disposition at Discharge: 01-Home or Self Care  Discharge Orders:   Condition at Discharge:   Stable  Time spent on Discharge:  Greater than 30 minutes.  Signed: Devi Hopman M 08/15/2013, 5:15 PM

## 2013-08-15 NOTE — Progress Notes (Signed)
Patient ID: [name], female   DOB: [birth year], 23 y.o.   MRN: [id number] Discharge instructions given to patient along with follow up information.  Prescription given for Oxycodone, patient instructed to pick up antibiotic from pharmacy.  Patient stating pain is now at 4/10.  Patient's family at bedside.

## 2013-08-16 ENCOUNTER — Encounter: Payer: Self-pay | Admitting: Family Medicine

## 2013-08-20 IMAGING — CR DG CHEST 2V
2 series · 2 of 2 positions shown · non-contrast
Comparison: 08/27/2012

CLINICAL DATA: Chest pain, shortness of breath, sickle cell crisis

CHEST - 2 VIEW

[w chest pa]
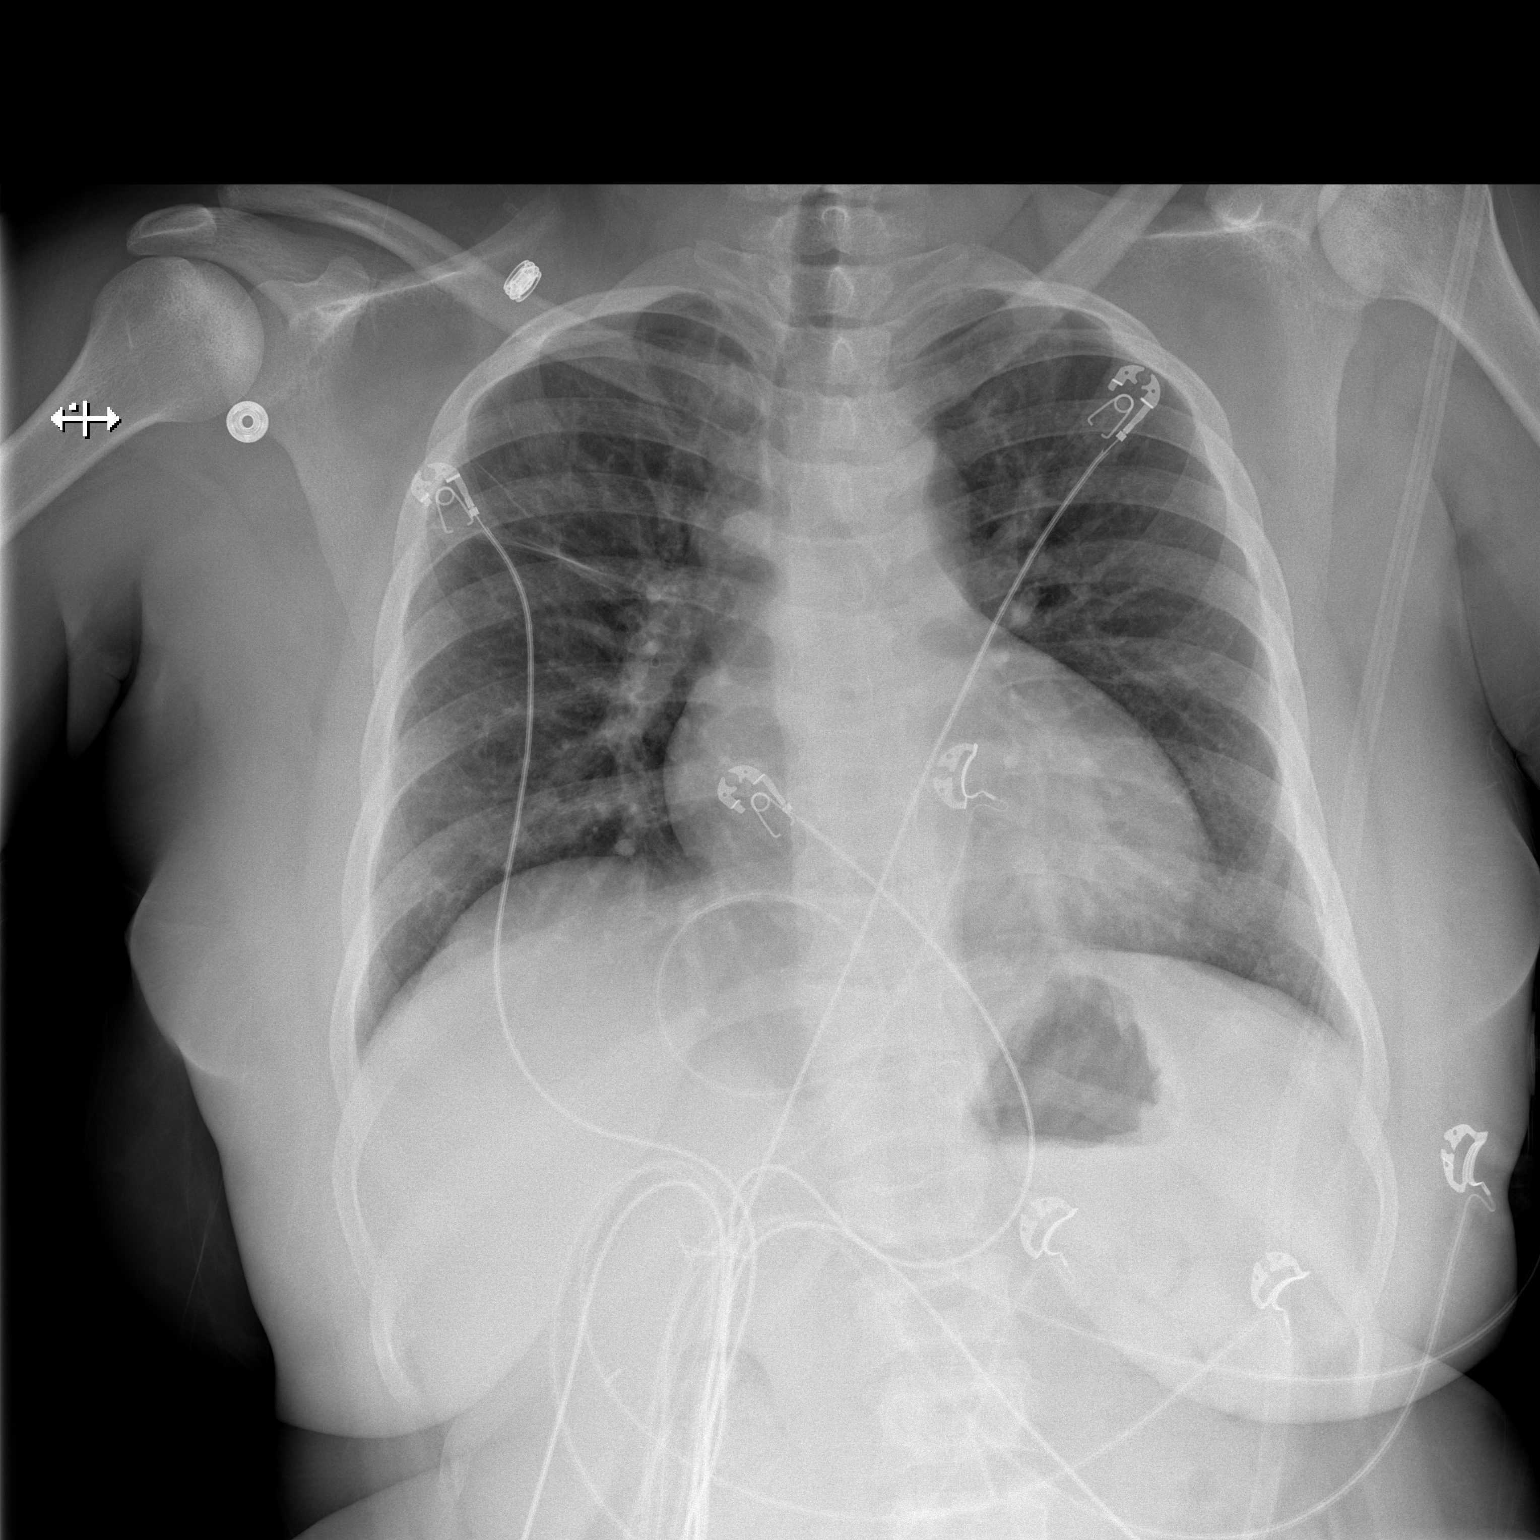

[w chest lat]
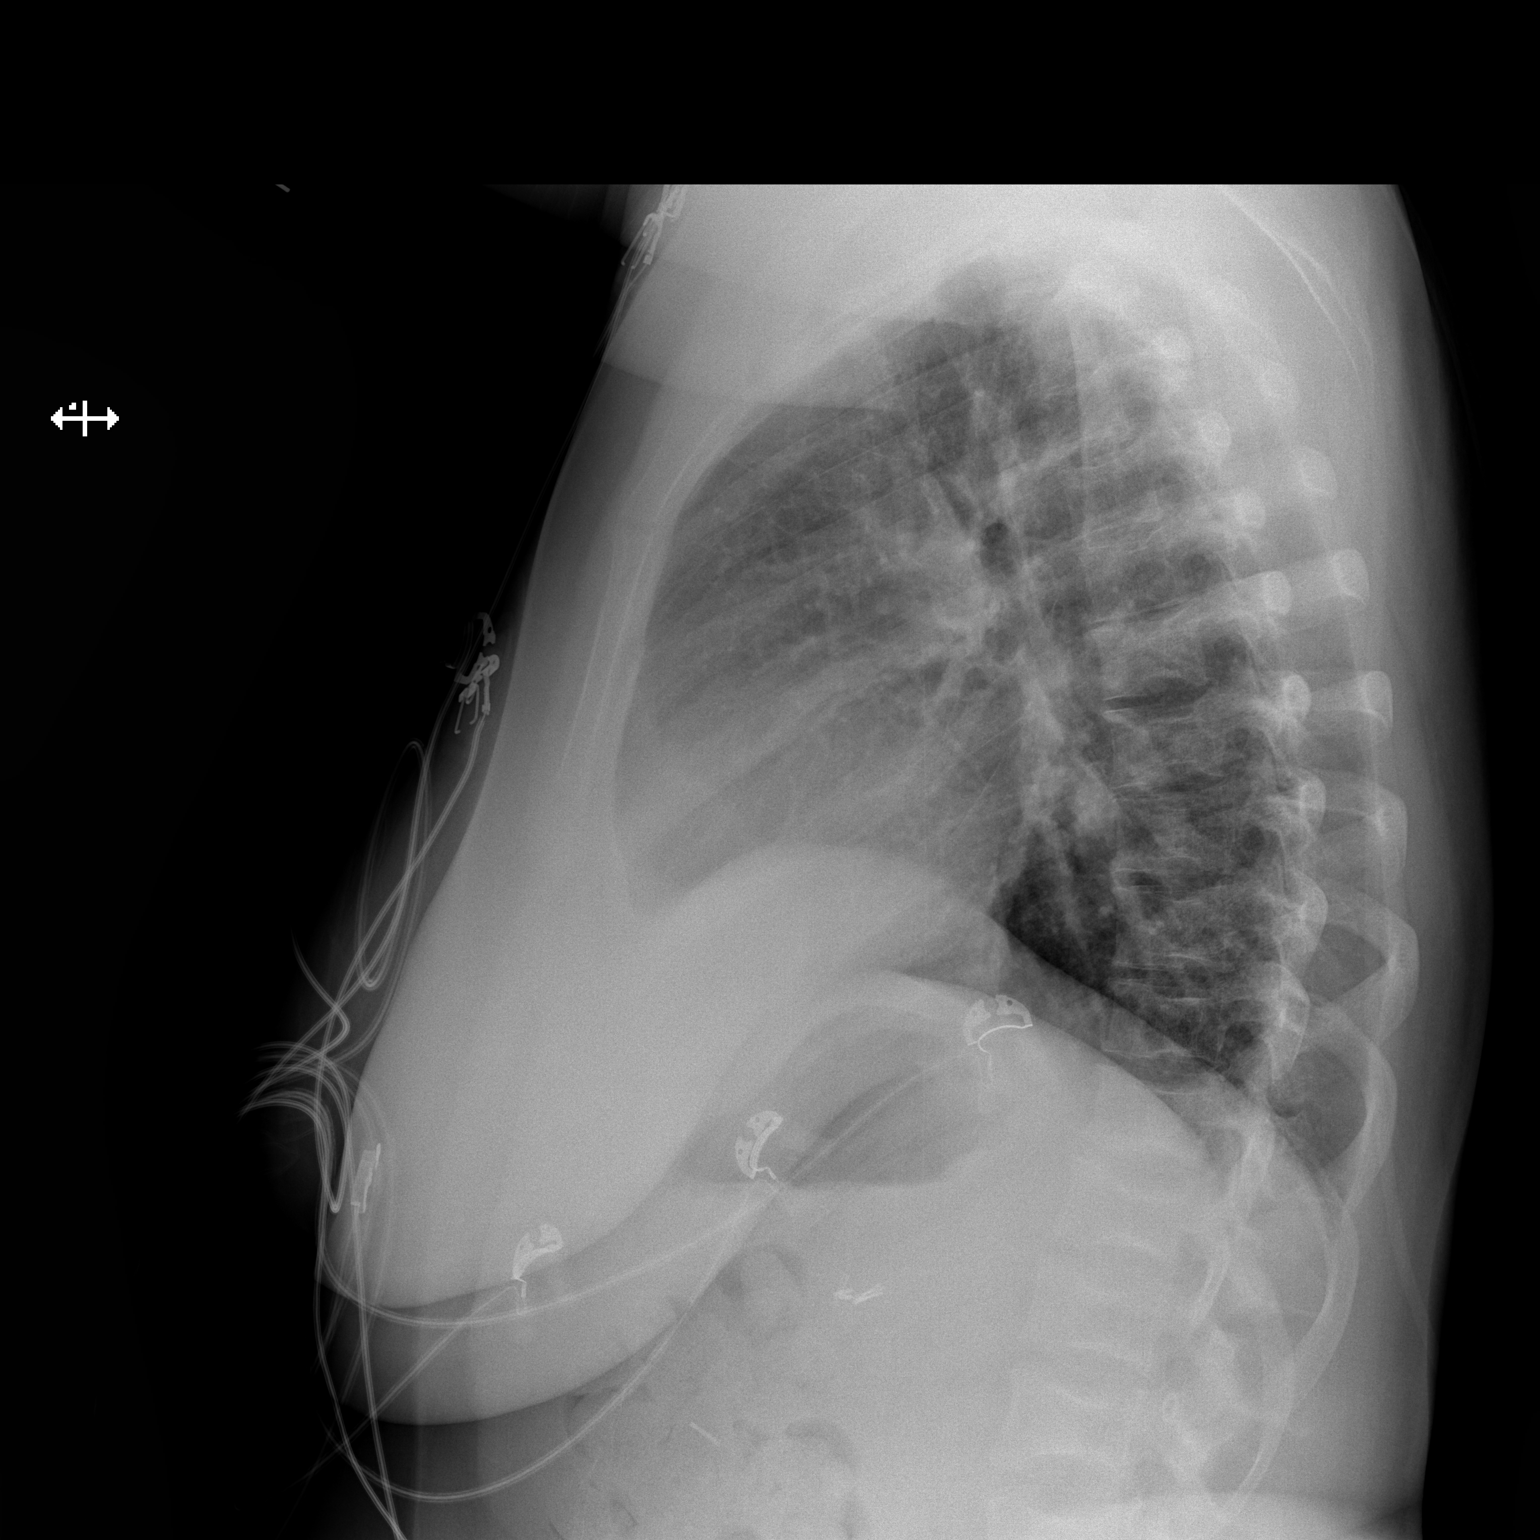

[2 of 2 positions shown; findings below may reference images not displayed]

FINDINGS: Lung volumes are low with crowding of the bronchovascular
markings.  Heart size mildly enlarged with central vascular
congestion.  No focal pulmonary opacity.  No pleural effusion.  No
acute osseous finding.  No significant change in fish mouth
deformity of vertebral bodies typical for sickle cell disease.
Cholecystectomy clips noted.
IMPRESSION: Low lung volumes with mild cardiomegaly and central vascular
congestion.

## 2013-08-25 IMAGING — XA IR FLUORO GUIDE CV LINE*R*
1 series · 1 of 1 positions shown · non-contrast
Comparison: None.

CLINICAL DATA: Sickle cell anemia with crisis, poor venous access;
request is made for central venous access for fluids and
medications.

[Series 300: ir fluoro guide cv midline picc *l* · 1 of 1 slices shown]
[im 1/1]
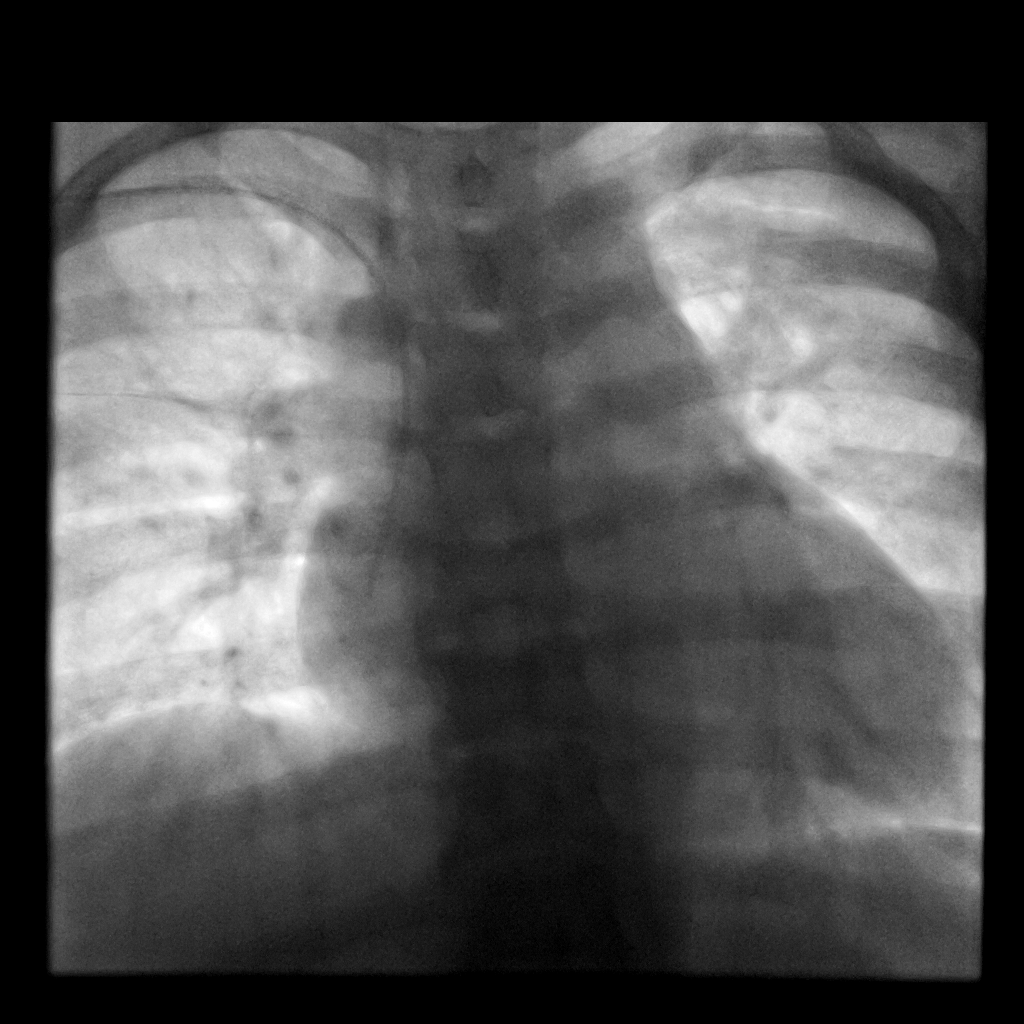

[1 of 1 positions shown; findings below may reference images not displayed]

PICC LINE PLACEMENT WITH ULTRASOUND AND FLUOROSCOPIC  GUIDANCE

Fluoroscopy Time: 0.2 minutes

The right arm was prepped with chlorhexidine, draped in the usual
sterile fashion using maximum barrier technique (cap and mask,
sterile gown, sterile gloves, large sterile sheet, hand hygiene and
cutaneous antisepsis) and infiltrated locally with 1% Lidocaine.

Ultrasound demonstrated patency of the right basilic vein, and this
was documented with an image.  Under real-time ultrasound guidance,
this vein was accessed with a 21 gauge micropuncture needle and
image documentation was performed.  The needle was exchanged over a
guidewire for a peel-away sheath through which a five French double
lumen PICC trimmed to 37 cm was advanced, positioned with its tip
at the lower SVC/right atrial junction.  Fluoroscopy during the
procedure and fluoro spot radiograph confirms appropriate catheter
position.  The catheter was flushed, secured to the skin with
Prolene sutures, and covered with a sterile dressing.

Complications:  None
IMPRESSION: Successful right arm PICC line placement with ultrasound and
fluoroscopic guidance.  The catheter is ready for use.

Read by: Jumper, Klever.-NYA

IR RIGHT FLOURO GUIDE CV LINE

Fluoroscopy Time: dictate in minutes & seconds
FINDINGS: 
IMPRESSION:

## 2013-08-26 NOTE — Discharge Summary (Signed)
Patient is an and examined. Agree with notes above

## 2013-08-26 NOTE — Progress Notes (Signed)
Patient seen and examined and I agree with notes above

## 2013-08-27 ENCOUNTER — Ambulatory Visit (INDEPENDENT_AMBULATORY_CARE_PROVIDER_SITE_OTHER): Payer: Medicare Other | Admitting: Family Medicine

## 2013-08-27 ENCOUNTER — Telehealth (HOSPITAL_COMMUNITY): Payer: Self-pay | Admitting: Hematology

## 2013-08-27 ENCOUNTER — Telehealth: Payer: Self-pay | Admitting: Internal Medicine

## 2013-08-27 ENCOUNTER — Encounter: Payer: Self-pay | Admitting: Family Medicine

## 2013-08-27 VITALS — BP 91/27 | HR 60 | Temp 98.5°F | Resp 20 | Wt 176.0 lb

## 2013-08-27 DIAGNOSIS — R197 Diarrhea, unspecified: Secondary | ICD-10-CM | POA: Diagnosis not present

## 2013-08-27 DIAGNOSIS — D571 Sickle-cell disease without crisis: Secondary | ICD-10-CM | POA: Diagnosis not present

## 2013-08-27 DIAGNOSIS — G8929 Other chronic pain: Secondary | ICD-10-CM | POA: Diagnosis not present

## 2013-08-27 DIAGNOSIS — M25521 Pain in right elbow: Secondary | ICD-10-CM

## 2013-08-27 DIAGNOSIS — L02219 Cutaneous abscess of trunk, unspecified: Secondary | ICD-10-CM

## 2013-08-27 DIAGNOSIS — K529 Noninfective gastroenteritis and colitis, unspecified: Secondary | ICD-10-CM

## 2013-08-27 DIAGNOSIS — D5700 Hb-SS disease with crisis, unspecified: Secondary | ICD-10-CM

## 2013-08-27 DIAGNOSIS — F17200 Nicotine dependence, unspecified, uncomplicated: Secondary | ICD-10-CM

## 2013-08-27 DIAGNOSIS — L02214 Cutaneous abscess of groin: Secondary | ICD-10-CM

## 2013-08-27 DIAGNOSIS — M25529 Pain in unspecified elbow: Secondary | ICD-10-CM

## 2013-08-27 DIAGNOSIS — L03319 Cellulitis of trunk, unspecified: Secondary | ICD-10-CM

## 2013-08-27 LAB — COMPLETE METABOLIC PANEL WITH GFR
ALT: 10 U/L (ref 0–35)
AST: 20 U/L (ref 0–37)
Albumin: 4.3 g/dL (ref 3.5–5.2)
Alkaline Phosphatase: 59 U/L (ref 39–117)
BILIRUBIN TOTAL: 2.8 mg/dL — AB (ref 0.2–1.2)
BUN: 7 mg/dL (ref 6–23)
CO2: 24 mEq/L (ref 19–32)
CREATININE: 0.72 mg/dL (ref 0.50–1.10)
Calcium: 9.4 mg/dL (ref 8.4–10.5)
Chloride: 102 mEq/L (ref 96–112)
GFR, Est African American: >89 mL/min
GFR, Est Non African American: >89 mL/min
Glucose, Bld: 77 mg/dL (ref 70–99)
Potassium: 4.4 mEq/L (ref 3.5–5.3)
Sodium: 137 mEq/L (ref 135–145)
TOTAL PROTEIN: 7.2 g/dL (ref 6.0–8.3)

## 2013-08-27 MED ORDER — ALIGN 4 MG PO CAPS: 4.0000 mg | ORAL_CAPSULE | Freq: Every day | ORAL | Status: DC

## 2013-08-27 MED ORDER — METHADONE HCL 5 MG PO TABS: 5.0000 mg | ORAL_TABLET | Freq: Every day | ORAL | Status: DC

## 2013-08-27 MED ORDER — FOLIC ACID 800 MCG PO TABS: 1600.0000 ug | ORAL_TABLET | Freq: Every morning | ORAL | Status: DC

## 2013-08-27 MED ORDER — HYDROXYUREA 500 MG PO CAPS: 500.0000 mg | ORAL_CAPSULE | Freq: Two times a day (BID) | ORAL | Status: DC

## 2013-08-27 NOTE — Telephone Encounter (Signed)
Patient called in C/O pain to her right arm that is 6/10.  Per patient is hurts to "move arm around and pick it up". Patient denies fever, chest pain, abdominal pain or N/V.  Patient also C/O of diarrhea for 2 days.  Per patient she is taking an antibiotic and the label says she could experience sever diarrhea. I explained that if she is having diarrhea she may have to go to the ED.  I advised I would discuss with Dr. Zigmund Daniel and give her a call back.  Patient verbalizes understanding.

## 2013-08-27 NOTE — Telephone Encounter (Signed)
Medication refill for methadone (DOLOPHINE) 5 MG tablet

## 2013-08-27 NOTE — Progress Notes (Signed)
Subjective:    Patient ID: [name], female    DOB: 09/22/1990, 23 y.o.   MRN: [id number]  HPI Patient in office complaining of right arm pain since 08/26/2013. Patient states that she has aching pain, radiating from wrist to elbow. Reports that pain is consistent with a sickle cell crisis. Current pain score is 6/10, Oxycodone at 7 am with maximum relief. Pain is worsened with continuous activity.   Also, patient complaining of soft stools. Stools described as soft, occurring 2-3 times daily. Soft stools primarily occur after eating heavy, fast, or processed foods. Patient states that she has been eating at [employer] since she started working there about 1 month ago. Patient has not attempted OTC interventions to alleviate soft stools.      Review of Systems  Constitutional: Positive for fatigue.  HENT: Negative.   Eyes: Negative.   Respiratory: Negative.   Cardiovascular: Negative.   Gastrointestinal: Positive for diarrhea (Soft stools for 2 days).  Endocrine: Negative.   Genitourinary: Negative.   Musculoskeletal: Positive for joint swelling (right arm edema) and myalgias.  Skin: Negative.   Neurological: Negative.   Hematological: Negative.   Psychiatric/Behavioral: Negative.        Objective:   Physical Exam  Constitutional: Vital signs are normal. She appears well-developed.  HENT:  Head: Normocephalic and atraumatic.  Eyes: Conjunctivae, EOM and lids are normal. Pupils are equal, round, and reactive to light. Lids are everted and swept, no foreign bodies found.  Neck: Trachea normal and normal range of motion. Neck supple.  Cardiovascular: Normal rate.  An irregular rhythm present.  Pulmonary/Chest: Effort normal.  Abdominal: Soft. Normal appearance and bowel sounds are normal. She exhibits no distension. There is no tenderness. There is no rebound and no CVA tenderness. No hernia. Hernia confirmed negative in the right inguinal area and confirmed negative in  the left inguinal area.  Musculoskeletal:       Right elbow: She exhibits decreased range of motion and swelling. She exhibits no deformity. Tenderness found.       Right wrist: She exhibits decreased range of motion, tenderness and swelling.  Lymphadenopathy:       Head (right side): No submental and no submandibular adenopathy present.       Head (left side): No submental and no submandibular adenopathy present.    She has cervical adenopathy.  Neurological: She is alert.  Skin:     Psychiatric: She has a normal mood and affect. Her speech is normal and behavior is normal. Judgment and thought content normal. Cognition and memory are normal.          Assessment & Plan:  1. Soft stools: Start Probiotic capsule daily or yogurt, lactobacillus. Patient given written instructions on bland soft diet. Patient recently started working at [employer] and admits that her diet has been consisting of fast and processed foods over the past month. Discussed the importance of maintaining a balanced diet.   2. Right arm pain: Given Samples of Aleve. Take 1 Capsule every 8 hours as needed for right mild to moderate right arm pain. Apply warm, moist compresses to right arm 20 minutes 4 times per day as needed.   3. Sickle cell anemia: Patient reports that she has not been taking Methadone consistently. Filled prescription for Methadone 5 mg at bedtime #30. Patient to take 1 tablet prior to going to bed. Also reminded to continue short acting pain medication. Take Oxycodone 1.5 tablets (15 mg) every 6 hours as needed  for moderate to severe pain. Continue daily folic acid and hydroxyurea as prescribed. Remain Hydrated with 64 ounces of water daily. Will review CBC and CMP  4. Right groin pain: Patient has 1 more day of antibiotic left. Patient to finish the prescription. The abscess is no longer present. Patient states that discomfort to area has improved greatly  5. Tobacco dependence: Patient reports that  she smokes 4-5 cigarettes per day. She maintains that she primarily smokes when she is stressed. She admits that she is ready to quit, but is not interested in smoking cessation classes or pharmacological interventions at this times.    Labs: CBCw/diff, CMP  50% of visit spent counseling   RTC: 1 month

## 2013-08-27 NOTE — Telephone Encounter (Signed)
Patient was seen in office for an acute visit on 08/27/2013. Addressed prescription refills during office visit.

## 2013-08-27 NOTE — Patient Instructions (Signed)
Patient to start bland diet for the next 24 hours. Given written instructions on bland diet. If patient tolerates bland diet for 24 hours. Transition to low fat, balanced diet divided over 6 small meals. Increase water intake to 64 ounces daily. Refrain from eating fast or processed foods.   Start Align Probiotic daily, wash hands frequently   Patient can return to work on 08/30/2013

## 2013-08-27 NOTE — Telephone Encounter (Signed)
Called patient back and advised Dr. Zigmund Daniel suggests she come in to the office for an office visit.  Per patient she is at work, and unable to come in until 12:30.  Appointment made for 12:30.  Patient verbalizes understanding.

## 2013-08-28 LAB — CBC WITH DIFFERENTIAL/PLATELET
BASOS PCT: 1 % (ref 0–1)
Basophils Absolute: 0.2 10*3/uL — ABNORMAL HIGH (ref 0.0–0.1)
EOS ABS: 0.5 10*3/uL (ref 0.0–0.7)
EOS PCT: 3 % (ref 0–5)
HCT: 27.7 % — ABNORMAL LOW (ref 36.0–46.0)
Hemoglobin: 9.5 g/dL — ABNORMAL LOW (ref 12.0–15.0)
LYMPHS ABS: 4 10*3/uL (ref 0.7–4.0)
Lymphocytes Relative: 23 % (ref 12–46)
MCH: 30.4 pg (ref 26.0–34.0)
MCHC: 34.3 g/dL (ref 30.0–36.0)
MCV: 88.5 fL (ref 78.0–100.0)
Monocytes Absolute: 1.8 10*3/uL — ABNORMAL HIGH (ref 0.1–1.0)
Monocytes Relative: 10 % (ref 3–12)
NEUTROS PCT: 63 % (ref 43–77)
Neutro Abs: 11.1 10*3/uL — ABNORMAL HIGH (ref 1.7–7.7)
PLATELETS: 622 10*3/uL — AB (ref 150–400)
RBC: 3.13 MIL/uL — AB (ref 3.87–5.11)
RDW: 18.1 % — AB (ref 11.5–15.5)
WBC: 17.6 10*3/uL — ABNORMAL HIGH (ref 4.0–10.5)

## 2013-08-30 ENCOUNTER — Encounter: Payer: Self-pay | Admitting: Family Medicine

## 2013-08-30 DIAGNOSIS — F17200 Nicotine dependence, unspecified, uncomplicated: Secondary | ICD-10-CM | POA: Insufficient documentation

## 2013-09-03 ENCOUNTER — Telehealth: Payer: Self-pay | Admitting: Internal Medicine

## 2013-09-04 ENCOUNTER — Encounter: Payer: Self-pay | Admitting: Internal Medicine

## 2013-09-04 ENCOUNTER — Ambulatory Visit: Payer: Medicare Other | Admitting: Family Medicine

## 2013-09-04 NOTE — Discharge Summary (Signed)
Physician Discharge Summary  Patient ID: TAEJAH OHALLORAN MRN: [id number] DOB/AGE: 07/22/1990 23 y.o.  Admit date: 05/27/2013 Discharge date: 06/01/2013  Admission Diagnoses:  Discharge Diagnoses:  Principal Problem:   Sickle cell pain crisis Active Problems:   Leg pain   Sickle cell anemia with pain   Discharged Condition: good  Hospital Course: patient admitted with sickle cell painful crisis. Was treated with IV Dilaudid, toradol and IVF. Responded to treatment well and was transitioned to home medications at time of discharge, to follow up with [name] as arranged.  Consults: None  Significant Diagnostic Studies: labs: Serial CBCs and CMPs most within limits.  Treatments: IV hydration and analgesia: Dilaudid  Discharge Exam: Blood pressure 126/76, pulse 104, temperature 99.2 F (37.3 C), temperature source Oral, resp. rate 18, height 5' 2.25" (1.581 m), weight 83.3 kg (183 lb 10.3 oz), last menstrual period 05/05/2013, SpO2 96.00%. General appearance: alert, cooperative and no distress Eyes: conjunctivae/corneas clear. PERRL, EOM's intact. Fundi benign. Neck: no adenopathy, no carotid bruit, no JVD, supple, symmetrical, trachea midline and thyroid not enlarged, symmetric, no tenderness/mass/nodules Back: symmetric, no curvature. ROM normal. No CVA tenderness. Resp: clear to auscultation bilaterally Cardio: regular rate and rhythm, S1, S2 normal, no murmur, click, rub or gallop GI: soft, non-tender; bowel sounds normal; no masses,  no organomegaly Extremities: extremities normal, atraumatic, no cyanosis or edema Pulses: 2+ and symmetric Skin: Skin color, texture, turgor normal. No rashes or lesions Neurologic: Grossly normal  Disposition: 01-Home or Self Care     Medication List    STOP taking these medications       methadone 5 MG tablet  Commonly known as:  DOLOPHINE     Oxycodone HCl 10 MG Tabs         Signed: Welton Bord L Maxamus Colao 06/01/2013, 2:20  PM

## 2013-09-05 MED ORDER — OXYCODONE HCL 10 MG PO TABS: 15.0000 mg | ORAL_TABLET | Freq: Four times a day (QID) | ORAL | Status: DC | PRN

## 2013-09-05 NOTE — Telephone Encounter (Signed)
Reorder Oxycodone 10 mg 1.5 every 6 hours as needed for severe pain #90

## 2013-09-16 ENCOUNTER — Telehealth: Payer: Self-pay

## 2013-09-16 NOTE — Telephone Encounter (Signed)
Attempt to contact Pt to re-set up appointment that she missed on 4/22 Unable to leave VM. Mail box is full at this time.Will try again at a later date

## 2013-09-18 ENCOUNTER — Telehealth: Payer: Self-pay | Admitting: Internal Medicine

## 2013-09-19 MED ORDER — OXYCODONE HCL 10 MG PO TABS: 15.0000 mg | ORAL_TABLET | Freq: Four times a day (QID) | ORAL | Status: DC | PRN

## 2013-09-19 NOTE — Telephone Encounter (Signed)
Reordered Oxycodone 10 mg (take 1.5 tablets every 6 hours as needed) for severe pain. #90  Reviewed [place] Substance Reporting system prior to reorder

## 2013-09-23 ENCOUNTER — Encounter (HOSPITAL_COMMUNITY): Payer: Self-pay | Admitting: Emergency Medicine

## 2013-09-23 ENCOUNTER — Emergency Department (HOSPITAL_COMMUNITY): Payer: Medicare Other

## 2013-09-23 ENCOUNTER — Inpatient Hospital Stay (HOSPITAL_COMMUNITY)
Admission: EM | Admit: 2013-09-23 | Discharge: 2013-09-27 | DRG: 812 | Disposition: A | Discharge status: Home / Self Care | Payer: Medicare Other | Attending: Family Medicine | Admitting: Family Medicine

## 2013-09-23 DIAGNOSIS — D473 Essential (hemorrhagic) thrombocythemia: Secondary | ICD-10-CM | POA: Diagnosis present

## 2013-09-23 DIAGNOSIS — F17200 Nicotine dependence, unspecified, uncomplicated: Secondary | ICD-10-CM | POA: Diagnosis present

## 2013-09-23 DIAGNOSIS — M545 Low back pain, unspecified: Secondary | ICD-10-CM | POA: Diagnosis present

## 2013-09-23 DIAGNOSIS — M87029 Idiopathic aseptic necrosis of unspecified humerus: Secondary | ICD-10-CM | POA: Diagnosis present

## 2013-09-23 DIAGNOSIS — D649 Anemia, unspecified: Secondary | ICD-10-CM | POA: Diagnosis not present

## 2013-09-23 DIAGNOSIS — N898 Other specified noninflammatory disorders of vagina: Secondary | ICD-10-CM

## 2013-09-23 DIAGNOSIS — Z833 Family history of diabetes mellitus: Secondary | ICD-10-CM | POA: Diagnosis not present

## 2013-09-23 DIAGNOSIS — M549 Dorsalgia, unspecified: Secondary | ICD-10-CM | POA: Diagnosis not present

## 2013-09-23 DIAGNOSIS — E876 Hypokalemia: Secondary | ICD-10-CM

## 2013-09-23 DIAGNOSIS — D5700 Hb-SS disease with crisis, unspecified: Principal | ICD-10-CM | POA: Diagnosis present

## 2013-09-23 DIAGNOSIS — G43909 Migraine, unspecified, not intractable, without status migrainosus: Secondary | ICD-10-CM

## 2013-09-23 DIAGNOSIS — E663 Overweight: Secondary | ICD-10-CM

## 2013-09-23 DIAGNOSIS — K5900 Constipation, unspecified: Secondary | ICD-10-CM

## 2013-09-23 DIAGNOSIS — R000 Tachycardia, unspecified: Secondary | ICD-10-CM

## 2013-09-23 DIAGNOSIS — K219 Gastro-esophageal reflux disease without esophagitis: Secondary | ICD-10-CM | POA: Diagnosis present

## 2013-09-23 DIAGNOSIS — Z9119 Patient's noncompliance with other medical treatment and regimen: Secondary | ICD-10-CM

## 2013-09-23 DIAGNOSIS — F439 Reaction to severe stress, unspecified: Secondary | ICD-10-CM

## 2013-09-23 DIAGNOSIS — L02214 Cutaneous abscess of groin: Secondary | ICD-10-CM

## 2013-09-23 DIAGNOSIS — Z30011 Encounter for initial prescription of contraceptive pills: Secondary | ICD-10-CM

## 2013-09-23 DIAGNOSIS — R110 Nausea: Secondary | ICD-10-CM

## 2013-09-23 DIAGNOSIS — E875 Hyperkalemia: Secondary | ICD-10-CM

## 2013-09-23 DIAGNOSIS — F6389 Other impulse disorders: Secondary | ICD-10-CM

## 2013-09-23 DIAGNOSIS — M25519 Pain in unspecified shoulder: Secondary | ICD-10-CM | POA: Diagnosis present

## 2013-09-23 DIAGNOSIS — D72829 Elevated white blood cell count, unspecified: Secondary | ICD-10-CM | POA: Diagnosis present

## 2013-09-23 DIAGNOSIS — F32A Depression, unspecified: Secondary | ICD-10-CM

## 2013-09-23 DIAGNOSIS — K529 Noninfective gastroenteritis and colitis, unspecified: Secondary | ICD-10-CM

## 2013-09-23 DIAGNOSIS — Z91199 Patient's noncompliance with other medical treatment and regimen due to unspecified reason: Secondary | ICD-10-CM | POA: Diagnosis not present

## 2013-09-23 DIAGNOSIS — R079 Chest pain, unspecified: Secondary | ICD-10-CM | POA: Diagnosis not present

## 2013-09-23 DIAGNOSIS — G8929 Other chronic pain: Secondary | ICD-10-CM | POA: Diagnosis present

## 2013-09-23 DIAGNOSIS — Z23 Encounter for immunization: Secondary | ICD-10-CM

## 2013-09-23 DIAGNOSIS — M79651 Pain in right thigh: Secondary | ICD-10-CM

## 2013-09-23 DIAGNOSIS — M25521 Pain in right elbow: Secondary | ICD-10-CM

## 2013-09-23 DIAGNOSIS — R509 Fever, unspecified: Secondary | ICD-10-CM

## 2013-09-23 DIAGNOSIS — R51 Headache: Secondary | ICD-10-CM

## 2013-09-23 DIAGNOSIS — D571 Sickle-cell disease without crisis: Secondary | ICD-10-CM

## 2013-09-23 DIAGNOSIS — E43 Unspecified severe protein-calorie malnutrition: Secondary | ICD-10-CM

## 2013-09-23 DIAGNOSIS — R4586 Emotional lability: Secondary | ICD-10-CM

## 2013-09-23 DIAGNOSIS — R112 Nausea with vomiting, unspecified: Secondary | ICD-10-CM

## 2013-09-23 DIAGNOSIS — J020 Streptococcal pharyngitis: Secondary | ICD-10-CM

## 2013-09-23 DIAGNOSIS — Z7251 High risk heterosexual behavior: Secondary | ICD-10-CM

## 2013-09-23 DIAGNOSIS — F329 Major depressive disorder, single episode, unspecified: Secondary | ICD-10-CM

## 2013-09-23 DIAGNOSIS — M79606 Pain in leg, unspecified: Secondary | ICD-10-CM

## 2013-09-23 DIAGNOSIS — R1031 Right lower quadrant pain: Secondary | ICD-10-CM

## 2013-09-23 LAB — BASIC METABOLIC PANEL
BUN: 7 mg/dL (ref 6–23)
CO2: 24 meq/L (ref 19–32)
Calcium: 9.2 mg/dL (ref 8.4–10.5)
Chloride: 102 mEq/L (ref 96–112)
Creatinine, Ser: 0.68 mg/dL (ref 0.50–1.10)
GFR calc Af Amer: >90 mL/min (ref >90)
Glucose, Bld: 99 mg/dL (ref 70–99)
Potassium: 5.1 mEq/L (ref 3.7–5.3)
SODIUM: 139 meq/L (ref 137–147)

## 2013-09-23 LAB — CBC WITH DIFFERENTIAL/PLATELET
BASOS ABS: 0.1 10*3/uL (ref 0.0–0.1)
Basophils Relative: 1 % (ref 0–1)
EOS PCT: 5 % (ref 0–5)
Eosinophils Absolute: 0.7 10*3/uL (ref 0.0–0.7)
HCT: 27.2 % — ABNORMAL LOW (ref 36.0–46.0)
Hemoglobin: 9.5 g/dL — ABNORMAL LOW (ref 12.0–15.0)
LYMPHS PCT: 29 % (ref 12–46)
Lymphs Abs: 4.1 10*3/uL — ABNORMAL HIGH (ref 0.7–4.0)
MCH: 30.8 pg (ref 26.0–34.0)
MCHC: 34.9 g/dL (ref 30.0–36.0)
MCV: 88.3 fL (ref 78.0–100.0)
Monocytes Absolute: 1.7 10*3/uL — ABNORMAL HIGH (ref 0.1–1.0)
Monocytes Relative: 12 % (ref 3–12)
NEUTROS ABS: 7.6 10*3/uL (ref 1.7–7.7)
Neutrophils Relative %: 53 % (ref 43–77)
PLATELETS: 542 10*3/uL — AB (ref 150–400)
RBC: 3.08 MIL/uL — ABNORMAL LOW (ref 3.87–5.11)
RDW: 17.6 % — AB (ref 11.5–15.5)
WBC: 14.2 10*3/uL — AB (ref 4.0–10.5)

## 2013-09-23 LAB — RETICULOCYTES
RBC.: 3.08 MIL/uL — ABNORMAL LOW (ref 3.87–5.11)
RETIC CT PCT: 19.1 % — AB (ref 0.4–3.1)
Retic Count, Absolute: 588.3 10*3/uL — ABNORMAL HIGH (ref 19.0–186.0)

## 2013-09-23 NOTE — ED Notes (Signed)
Pt complains of sickle cell pain in her arms and legs

## 2013-09-24 ENCOUNTER — Encounter (HOSPITAL_COMMUNITY): Payer: Self-pay | Admitting: Internal Medicine

## 2013-09-24 DIAGNOSIS — D5700 Hb-SS disease with crisis, unspecified: Secondary | ICD-10-CM | POA: Diagnosis not present

## 2013-09-24 DIAGNOSIS — D649 Anemia, unspecified: Secondary | ICD-10-CM | POA: Diagnosis not present

## 2013-09-24 LAB — COMPREHENSIVE METABOLIC PANEL
ALBUMIN: 4 g/dL (ref 3.5–5.2)
ALT: 11 U/L (ref 0–35)
AST: 19 U/L (ref 0–37)
Alkaline Phosphatase: 56 U/L (ref 39–117)
BILIRUBIN TOTAL: 2 mg/dL — AB (ref 0.3–1.2)
BUN: 5 mg/dL — AB (ref 6–23)
CALCIUM: 9.2 mg/dL (ref 8.4–10.5)
CHLORIDE: 104 meq/L (ref 96–112)
CO2: 22 mEq/L (ref 19–32)
Creatinine, Ser: 0.62 mg/dL (ref 0.50–1.10)
GFR calc Af Amer: >90 mL/min (ref >90)
GFR calc non Af Amer: >90 mL/min (ref >90)
Glucose, Bld: 96 mg/dL (ref 70–99)
Potassium: 4.1 mEq/L (ref 3.7–5.3)
Sodium: 138 mEq/L (ref 137–147)
Total Protein: 7 g/dL (ref 6.0–8.3)

## 2013-09-24 LAB — DIFFERENTIAL
Basophils Absolute: 0 10*3/uL (ref 0.0–0.1)
Basophils Relative: 0 % (ref 0–1)
Eosinophils Absolute: 0.2 10*3/uL (ref 0.0–0.7)
Eosinophils Relative: 1 % (ref 0–5)
Lymphocytes Relative: 16 % (ref 12–46)
Lymphs Abs: 3.4 10*3/uL (ref 0.7–4.0)
MONO ABS: 2.3 10*3/uL — AB (ref 0.1–1.0)
Monocytes Relative: 11 % (ref 3–12)
NEUTROS PCT: 72 % (ref 43–77)
Neutro Abs: 15.4 10*3/uL — ABNORMAL HIGH (ref 1.7–7.7)

## 2013-09-24 LAB — URINALYSIS, ROUTINE W REFLEX MICROSCOPIC
Bilirubin Urine: NEGATIVE
GLUCOSE, UA: NEGATIVE mg/dL
Hgb urine dipstick: NEGATIVE
Ketones, ur: NEGATIVE mg/dL
Leukocytes, UA: NEGATIVE
NITRITE: NEGATIVE
PROTEIN: NEGATIVE mg/dL
Specific Gravity, Urine: 1.01 (ref 1.005–1.030)
UROBILINOGEN UA: 1 mg/dL (ref 0.0–1.0)
pH: 7.5 (ref 5.0–8.0)

## 2013-09-24 LAB — CBC
HCT: 25.2 % — ABNORMAL LOW (ref 36.0–46.0)
Hemoglobin: 8.9 g/dL — ABNORMAL LOW (ref 12.0–15.0)
MCH: 31.2 pg (ref 26.0–34.0)
MCHC: 35.3 g/dL (ref 30.0–36.0)
MCV: 88.4 fL (ref 78.0–100.0)
PLATELETS: 543 10*3/uL — AB (ref 150–400)
RBC: 2.85 MIL/uL — AB (ref 3.87–5.11)
RDW: 17.6 % — AB (ref 11.5–15.5)
WBC: 21.3 10*3/uL — AB (ref 4.0–10.5)

## 2013-09-24 LAB — CREATININE, SERUM: Creatinine, Ser: 0.59 mg/dL (ref 0.50–1.10)

## 2013-09-24 LAB — PREGNANCY, URINE: PREG TEST UR: NEGATIVE

## 2013-09-24 MED ORDER — HYDROMORPHONE HCL PF 2 MG/ML IJ SOLN
2.0000 mg | Freq: Once | INTRAMUSCULAR | Status: AC
  Administered 2013-09-24: 2 mg via INTRAVENOUS
  Filled 2013-09-24: qty 1

## 2013-09-24 MED ORDER — SODIUM CHLORIDE 0.9 % IJ SOLN: 9.0000 mL | INTRAMUSCULAR | Status: DC | PRN

## 2013-09-24 MED ORDER — ONDANSETRON HCL 4 MG/2ML IJ SOLN
4.0000 mg | Freq: Once | INTRAMUSCULAR | Status: AC
Start: 2013-09-24 — End: 2013-09-24
  Administered 2013-09-24: 4 mg via INTRAVENOUS
  Filled 2013-09-24: qty 2

## 2013-09-24 MED ORDER — SODIUM CHLORIDE 0.45 % IV SOLN
INTRAVENOUS | Status: AC
  Administered 2013-09-24 (×2): via INTRAVENOUS

## 2013-09-24 MED ORDER — ONDANSETRON HCL 4 MG/2ML IJ SOLN: 4.0000 mg | Freq: Four times a day (QID) | INTRAMUSCULAR | Status: DC | PRN

## 2013-09-24 MED ORDER — HYDROMORPHONE 0.3 MG/ML IV SOLN
INTRAVENOUS | Status: DC
  Administered 2013-09-24: 07:00:00 via INTRAVENOUS
  Administered 2013-09-24: 7.5 mg via INTRAVENOUS
  Filled 2013-09-24 (×2): qty 25

## 2013-09-24 MED ORDER — DIPHENHYDRAMINE HCL 25 MG PO CAPS: 25.0000 mg | ORAL_CAPSULE | Freq: Four times a day (QID) | ORAL | Status: DC | PRN

## 2013-09-24 MED ORDER — HYDROMORPHONE 0.3 MG/ML IV SOLN
INTRAVENOUS | Status: DC
  Administered 2013-09-24: 20:00:00 via INTRAVENOUS
  Administered 2013-09-25: 9.41 mg via INTRAVENOUS
  Administered 2013-09-25: 02:00:00 via INTRAVENOUS
  Administered 2013-09-25: 3 mg via INTRAVENOUS
  Administered 2013-09-25 (×2): via INTRAVENOUS
  Administered 2013-09-25: 7.63 mg via INTRAVENOUS
  Administered 2013-09-25: 7.1 mg via INTRAVENOUS
  Filled 2013-09-24 (×4): qty 25

## 2013-09-24 MED ORDER — METHADONE HCL 5 MG PO TABS
5.0000 mg | ORAL_TABLET | Freq: Every day | ORAL | Status: DC
  Administered 2013-09-24 – 2013-09-26 (×3): 5 mg via ORAL
  Filled 2013-09-24 (×3): qty 1

## 2013-09-24 MED ORDER — HYDROMORPHONE HCL PF 1 MG/ML IJ SOLN
1.0000 mg | INTRAMUSCULAR | Status: DC | PRN
  Administered 2013-09-24 (×3): 1 mg via INTRAVENOUS
  Filled 2013-09-24 (×3): qty 1

## 2013-09-24 MED ORDER — ENOXAPARIN SODIUM 40 MG/0.4ML ~~LOC~~ SOLN
40.0000 mg | SUBCUTANEOUS | Status: DC
  Administered 2013-09-24 – 2013-09-27 (×4): 40 mg via SUBCUTANEOUS
  Filled 2013-09-24 (×4): qty 0.4

## 2013-09-24 MED ORDER — FENTANYL CITRATE 0.05 MG/ML IJ SOLN
50.0000 ug | Freq: Once | INTRAMUSCULAR | Status: AC
  Administered 2013-09-24: 50 ug via INTRAVENOUS

## 2013-09-24 MED ORDER — FOLIC ACID 1 MG PO TABS
1.0000 mg | ORAL_TABLET | Freq: Every day | ORAL | Status: DC
  Administered 2013-09-24 – 2013-09-27 (×4): 1 mg via ORAL
  Filled 2013-09-24 (×4): qty 1

## 2013-09-24 MED ORDER — NALOXONE HCL 0.4 MG/ML IJ SOLN: 0.4000 mg | INTRAMUSCULAR | Status: DC | PRN

## 2013-09-24 MED ORDER — HYDROXYUREA 500 MG PO CAPS
500.0000 mg | ORAL_CAPSULE | Freq: Two times a day (BID) | ORAL | Status: DC
  Administered 2013-09-24: 500 mg via ORAL
  Filled 2013-09-24 (×2): qty 1

## 2013-09-24 MED ORDER — HYDROXYUREA 500 MG PO CAPS
1000.0000 mg | ORAL_CAPSULE | Freq: Every day | ORAL | Status: DC
  Administered 2013-09-25 – 2013-09-27 (×3): 1000 mg via ORAL
  Filled 2013-09-24 (×4): qty 2

## 2013-09-24 MED ORDER — HYDROMORPHONE 0.3 MG/ML IV SOLN
INTRAVENOUS | Status: DC
  Administered 2013-09-24: 7.5 mg via INTRAVENOUS
  Filled 2013-09-24: qty 25

## 2013-09-24 MED ORDER — KETOROLAC TROMETHAMINE 30 MG/ML IJ SOLN
30.0000 mg | Freq: Four times a day (QID) | INTRAMUSCULAR | Status: DC
  Administered 2013-09-24 – 2013-09-27 (×12): 30 mg via INTRAVENOUS
  Filled 2013-09-24 (×2): qty 1
  Filled 2013-09-24: qty 2
  Filled 2013-09-24 (×9): qty 1
  Filled 2013-09-24 (×2): qty 2
  Filled 2013-09-24 (×5): qty 1

## 2013-09-24 MED ORDER — FENTANYL CITRATE 0.05 MG/ML IJ SOLN
INTRAMUSCULAR | Status: AC
  Filled 2013-09-24: qty 2

## 2013-09-24 NOTE — ED Notes (Signed)
Per Neoma Laming in lab, urine preg can be added to urine in lab. Collection slip sent to lab for processing.

## 2013-09-24 NOTE — ED Notes (Signed)
Patient requested additional juice, peanut butter and crackers. When returning to bring patient her requested snacks, it was noted the previously provided beverages and snacks remained unopened. Patient states "I just wanted to have it", patient advised that is all the juice she will be able to have as we are limited on our juice supply at this time.

## 2013-09-24 NOTE — ED Notes (Signed)
Attempted to call report, nurse Gershon Mussel is unavailable, states he will call back.

## 2013-09-24 NOTE — ED Notes (Signed)
Patient calling, yelling, and cursing at nursing staff. Demanding narcotics. Patient has been medicated according to protocol, but is very familiar with narcotic medications due to medical history and previous visits. Patient was spoken to in reference to her behavior towards staff and the delay has been explained as well.

## 2013-09-24 NOTE — ED Notes (Signed)
Patient ambulated to restroom prior to order for urine pregnancy. Will attempt to collect on next void.

## 2013-09-24 NOTE — Progress Notes (Signed)
SICKLE CELL SERVICE PROGRESS NOTE  [name] YQM:[id number] DOB: 10-09-1990 DOA: 09/23/2013 PCP: Ngai Parcell A., MD  Assessment/Plan: Principal Problem:   Sickle cell crisis Active Problems:   SICKLE CELL ANEMIA   Sickle cell pain crisis  1. Hb SS with Vaso-occlusive Crisis: Pt here with pain 8/10 at present localized to RUE and BLE's. She has had little relief so far but has used only . Will continue PCA, I have added Toradol and re-evaluate in the evening for need for basal rate overnight. 2. Leukocytosis: Pt has no evidence of an infection. Likely related to crisis. Will continue to observe. Pt is likely non-compliant with Hydrea in light of elevated WBC and platelets.  3.  Amenia: Pt at baseline. No indication for transfusion.    Code Status: Full Code Family Communication: N/A Disposition Plan: Not yet ready for discharge  [name]  Pager [phone]. If 7PM-7AM, please contact night-coverage.  09/24/2013, 12:14 PM  LOS: 1 day   Brief narrative: [name] is a 23 y.o. female with history of sickle cell disease presents with complaints of increasing pain in the right upper and lower extremities since last evening. Patient says that she has been taking her regular sickle cell pain relief medications despite which the pain has not improved. Denies any headache visual symptoms any weakness of the upper or lower extremities any chest pain or shortness of breath. In the ER patient had multiple doses of Dilaudid despite which patient was still having pain and has been admitted for pain relief. Patient has some mild leukocytosis which appears to be chronic but patient is afebrile.      Consultants:  None  Procedures:  None  Antibiotics:  Non  HPI/Subjective:  Pt here with pain 8/10 at present localized to RUE and BLE's. She describes pain as throbbing and sharp in nature and non-radiating. She has had little relief so far but has been sleeping for  most of the morning.  Objective: Filed Vitals:   09/24/13 4401 09/24/13 0631 09/24/13 0633 09/24/13 1146  BP: 151/73 124/81    Pulse: 95 88    Temp:  98.3 F (36.8 C)    TempSrc:  Oral    Resp: 18 18 16 16   Height:  5\' 1"  (1.549 m)    Weight:  165 lb (74.844 kg)    SpO2: 95% 97% 98% 95%   Weight change:  No intake or output data in the 24 hours ending 09/24/13 1214  General: Alert, awake, oriented x3, in no acute distress.  HEENT: [place]/AT PEERL, EOMI, anicteric Neck: Trachea midline,  no masses, no thyromegal,y no JVD, no carotid bruit OROPHARYNX:  Moist, No exudate/ erythema/lesions.  Heart: Regular rate and rhythm, without murmurs, rubs, gallops.  Lungs: Clear to auscultation, no wheezing or rhonchi noted.  Abdomen: Soft, nontender, nondistended, positive bowel sounds, no masses no hepatosplenomegaly noted.  Neuro: No focal neurological deficits noted cranial nerves II through XII grossly intact. Strength normal in bilateral upper and lower extremities. Musculoskeletal: No warm swelling or erythema around joints. Psychiatric: Patient alert and oriented x3, good insight and cognition, good recent to remote recall. Lymph node survey: No cervical axillary or inguinal lymphadenopathy noted.   Data Reviewed: Basic Metabolic Panel:  Recent Labs Lab 09/23/13 2250 09/24/13 0820  NA 139 138  K 5.1 4.1  CL 102 104  CO2 24 22  GLUCOSE 99 96  BUN 7 5*  CREATININE 0.68 0.59  0.62  CALCIUM 9.2 9.2  Liver Function Tests:  Recent Labs Lab 09/24/13 0820  AST 19  ALT 11  ALKPHOS 56  BILITOT 2.0*  PROT 7.0  ALBUMIN 4.0   No results found for this basename: LIPASE, AMYLASE,  in the last 168 hours No results found for this basename: AMMONIA,  in the last 168 hours CBC:  Recent Labs Lab 09/23/13 2250 09/24/13 0820  WBC 14.2* 21.3*  NEUTROABS 7.6 15.4*  HGB 9.5* 8.9*  HCT 27.2* 25.2*  MCV 88.3 88.4  PLT 542* 543*   Cardiac Enzymes: No results found for this  basename: CKTOTAL, CKMB, CKMBINDEX, TROPONINI,  in the last 168 hours BNP (last 3 results) No results found for this basename: PROBNP,  in the last 8760 hours CBG: No results found for this basename: GLUCAP,  in the last 168 hours  No results found for this or any previous visit (from the past 240 hour(s)).   Studies: Dg Chest 1 View  09/23/2013   CLINICAL DATA:  Chest pain, sickle cell disease  EXAM: CHEST - 1 VIEW  COMPARISON:  07/30/1948  FINDINGS: Cardiac shadow is within normal limits. The lungs are clear bilaterally. No focal infiltrate or sizable effusion is seen no acute bony abnormality is noted.  IMPRESSION: No active disease.   Electronically Signed   By: Inez Catalina M.D.   On: 09/23/2013 22:57    Scheduled Meds: . enoxaparin (LOVENOX) injection  40 mg Subcutaneous Q24H  . fentaNYL      . folic acid  1 mg Oral Daily  . HYDROmorphone PCA 0.3 mg/mL   Intravenous 6 times per day  . hydroxyurea  500 mg Oral BID  . ketorolac  30 mg Intravenous 4 times per day  . methadone  5 mg Oral QHS   Continuous Infusions: . sodium chloride 100 mL/hr at 09/24/13 0648    Total time 39 minutes

## 2013-09-24 NOTE — H&P (Signed)
[facility] History and Physical  TONISHIA STEFFY GDJ:[id number] DOB: 1990-09-19 DOA: 09/23/2013  Referring physician: ER physician. PCP: MATTHEWS,MICHELLE A., MD   Chief Complaint: Pain.  HPI: [name] is a 23 y.o. female with history of sickle cell disease presents with complaints of increasing pain in the right upper and lower extremities since last evening. Patient says that she has been taking her regular sickle cell pain relief medications despite which the pain has not improved. Denies any headache visual symptoms any weakness of the upper or lower extremities any chest pain or shortness of breath. In the ER patient had multiple doses of Dilaudid despite which patient was still having pain and has been admitted for pain relief. Patient has some mild leukocytosis which appears to be chronic but patient is afebrile.   Review of Systems: As presented in the history of presenting illness, rest negative.  Past Medical History  Diagnosis Date  . H/O: 1 miscarriage 03/22/2011    Pt reports 2 miscarriages.  [name] 01/08/2009  . Depression 01/06/2011  . GERD (gastroesophageal reflux disease) 02/17/2011  . Trichotillomania     h/o  . Blood transfusion     "lots"  . Sickle cell anemia with crisis   . Exertional dyspnea     "sometimes"  . Sickle cell anemia   . Headache(784.0)   . Migraines 11/08/11    "@ least twice/month"  . Chronic back pain     "very severe; have knot in my back; from tight muscle; take RX and exercise for it"  . Mood swings 11/08/11    "I go back and forth; real bad"  . Pregnancy   . Blood dyscrasia     SICKLE CELL   Past Surgical History  Procedure Laterality Date  . Cholecystectomy  05/2010  . Dilation and curettage of uterus  02/20/11    S/P miscarriage   Social History:  reports that she has been smoking Cigarettes.  She has a .25 pack-year smoking history. She has never used smokeless tobacco. She reports that she drinks  alcohol. She reports that she does not use illicit drugs. Where does patient live home. Can patient participate in ADLs? Yes.  Allergies  Allergen Reactions  . Carrot Oil Hives and Swelling  . Latex Rash    Family History:  Family History  Problem Relation Age of Onset  . Diabetes Mother   . Alcoholism    . Depression    . Hypercholesterolemia    . Hypertension    . Migraines    . Diabetes Maternal Grandmother   . Diabetes Paternal Grandmother       Prior to Admission medications   Medication Sig Start Date End Date Taking? Authorizing Provider  hydroxyurea (HYDREA) 500 MG capsule Take 1 capsule (500 mg total) by mouth 2 (two) times daily. May take with food to minimize GI side effects. 08/27/13  Yes Dorena Dew, FNP  methadone (DOLOPHINE) 5 MG tablet Take 1 tablet (5 mg total) by mouth at bedtime. 08/27/13  Yes Dorena Dew, FNP  Oxycodone HCl 10 MG TABS Take 1.5 tablets (15 mg total) by mouth every 6 (six) hours as needed (pain). 09/19/13  Yes Dorena Dew, FNP    Physical Exam: Filed Vitals:   09/23/13 2228 09/24/13 0341  BP: 146/90 124/61  Pulse: 98 91  Temp: 97.9 F (36.6 C) 98.7 F (37.1 C)  TempSrc: Oral Oral  Resp: 20 18  Height: 5\' 1"  (1.549 m)  Weight: 75.297 kg (166 lb)   SpO2: 99% 97%     General:  Well-developed and nourished.  Eyes: Anicteric no pallor.  ENT: No discharge from ears eyes nose mouth.  Neck: No mass felt.  Cardiovascular: S1-S2 heard.  Respiratory: No rhonchi or crepitations.  Abdomen: Soft nontender bowel sounds present. No guarding or rigidity.  Skin: No rash.  Musculoskeletal: No edema.  Psychiatric: Appears normal.  Neurologic: Alert awake oriented to time place and person. Moves all extremities.  Labs on Admission:  Basic Metabolic Panel:  Recent Labs Lab 09/23/13 2250  NA 139  K 5.1  CL 102  CO2 24  GLUCOSE 99  BUN 7  CREATININE 0.68  CALCIUM 9.2   Liver Function Tests: No results found for  this basename: AST, ALT, ALKPHOS, BILITOT, PROT, ALBUMIN,  in the last 168 hours No results found for this basename: LIPASE, AMYLASE,  in the last 168 hours No results found for this basename: AMMONIA,  in the last 168 hours CBC:  Recent Labs Lab 09/23/13 2250  WBC 14.2*  NEUTROABS 7.6  HGB 9.5*  HCT 27.2*  MCV 88.3  PLT 542*   Cardiac Enzymes: No results found for this basename: CKTOTAL, CKMB, CKMBINDEX, TROPONINI,  in the last 168 hours  BNP (last 3 results) No results found for this basename: PROBNP,  in the last 8760 hours CBG: No results found for this basename: GLUCAP,  in the last 168 hours  Radiological Exams on Admission: Dg Chest 1 View  09/23/2013   CLINICAL DATA:  Chest pain, sickle cell disease  EXAM: CHEST - 1 VIEW  COMPARISON:  07/30/1948  FINDINGS: Cardiac shadow is within normal limits. The lungs are clear bilaterally. No focal infiltrate or sizable effusion is seen no acute bony abnormality is noted.  IMPRESSION: No active disease.   Electronically Signed   By: Inez Catalina M.D.   On: 09/23/2013 22:57     Assessment/Plan Principal Problem:   Sickle cell crisis Active Problems:   SICKLE CELL ANEMIA   Sickle cell pain crisis   1. Sickle cell pain crisis - patient has been placed on custom dose Dilaudid PCA. Continue with patient's home long-acting pain medications and hydroxyurea. 2. Anemia - appears to be at baseline. 3. Leukocytosis and thrombocytosis - patient is afebrile. Closely follow CBC. Leukocytosis and thrombocytosis probably reactionary. Patient is on hydroxyurea.    Code Status: Full code.  Family Communication: None.  Disposition Plan: Admit to inpatient.    [facility] Pager [phone].  If 7PM-7AM, please contact night-coverage [email or web address] Password [facility] 09/24/2013, 5:52 AM

## 2013-09-24 NOTE — ED Provider Notes (Signed)
CSN: [id number]     Arrival date & time 09/23/13  2222 History   First MD Initiated Contact with Patient 09/24/13 0102     Chief Complaint  Patient presents with  . Sickle Cell Pain Crisis     (Consider location/radiation/quality/duration/timing/severity/associated sxs/prior Treatment) Patient is a 23 y.o. female presenting with sickle cell pain. The history is provided by the patient.  Sickle Cell Pain Crisis   She complains of right arm pain, that started at 8 PM tonight. She is taking her usual medications, without relief. She also has right hip pain. She states that this pain is typical of her usual sickle cell crisis. She denies fever, chills, nausea, vomiting, weakness, dizziness, change in urinary or bowel habits. There are no other known modifying factors.  Past Medical History  Diagnosis Date  . H/O: 1 miscarriage 03/22/2011    Pt reports 2 miscarriages.  [name] 01/08/2009  . Depression 01/06/2011  . GERD (gastroesophageal reflux disease) 02/17/2011  . Trichotillomania     h/o  . Blood transfusion     "lots"  . Sickle cell anemia with crisis   . Exertional dyspnea     "sometimes"  . Sickle cell anemia   . Headache(784.0)   . Migraines 11/08/11    "@ least twice/month"  . Chronic back pain     "very severe; have knot in my back; from tight muscle; take RX and exercise for it"  . Mood swings 11/08/11    "I go back and forth; real bad"  . Pregnancy   . Blood dyscrasia     SICKLE CELL   Past Surgical History  Procedure Laterality Date  . Cholecystectomy  05/2010  . Dilation and curettage of uterus  02/20/11    S/P miscarriage   Family History  Problem Relation Age of Onset  . Diabetes Mother   . Alcoholism    . Depression    . Hypercholesterolemia    . Hypertension    . Migraines    . Diabetes Maternal Grandmother   . Diabetes Paternal Grandmother    History  Substance Use Topics  . Smoking status: Current Some Day Smoker -- 0.25 packs/day for 1  years    Types: Cigarettes    Last Attempt to Quit: 03/25/2013  . Smokeless tobacco: Never Used  . Alcohol Use: Yes     Comment: pt states she quit marijuan in May 2013. Rare ETOH, + cigarettes.  She is enrolled in school   OB History   Grav Para Term Preterm Abortions TAB SAB Ect Mult Living   2    1  1         Obstetric Comments   Miscarried in October 2012 at about 7 weeks     Review of Systems  All other systems reviewed and are negative.     Allergies  Carrot oil and Latex  Home Medications   Prior to Admission medications   Medication Sig Start Date End Date Taking? Authorizing Provider  hydroxyurea (HYDREA) 500 MG capsule Take 1 capsule (500 mg total) by mouth 2 (two) times daily. May take with food to minimize GI side effects. 08/27/13  Yes Dorena Dew, FNP  methadone (DOLOPHINE) 5 MG tablet Take 1 tablet (5 mg total) by mouth at bedtime. 08/27/13  Yes Dorena Dew, FNP  Oxycodone HCl 10 MG TABS Take 1.5 tablets (15 mg total) by mouth every 6 (six) hours as needed (pain). 09/19/13  Yes Dorena Dew, FNP  BP 146/90  Pulse 98  Temp(Src) 97.9 F (36.6 C) (Oral)  Resp 20  Ht 5\' 1"  (1.549 m)  Wt 166 lb (75.297 kg)  BMI 31.38 kg/m2  SpO2 99%  LMP 09/04/2013 Physical Exam  Nursing note and vitals reviewed. Constitutional: She is oriented to person, place, and time. She appears well-developed and well-nourished. She appears distressed (She is crying).  HENT:  Head: Normocephalic and atraumatic.  Eyes: Conjunctivae and EOM are normal. Pupils are equal, round, and reactive to light.  Neck: Normal range of motion and phonation normal. Neck supple.  Cardiovascular: Normal rate, regular rhythm and intact distal pulses.   Pulmonary/Chest: Effort normal and breath sounds normal. She exhibits no tenderness.  Abdominal: Soft. She exhibits no distension. There is no tenderness. There is no guarding.  Musculoskeletal: Normal range of motion.  Right arm is diffusely  tender without deformity or skin changes. Right hip resists passive range of motion secondary to pain  Neurological: She is alert and oriented to person, place, and time. She exhibits normal muscle tone.  Skin: Skin is warm and dry.  Psychiatric: Her behavior is normal. Judgment and thought content normal.    ED Course  Procedures (including critical care time)  Medications  fentaNYL (SUBLIMAZE) 0.05 MG/ML injection (not administered)  ondansetron (ZOFRAN) injection 4 mg (not administered)  HYDROmorphone (DILAUDID) injection 1 mg (not administered)  fentaNYL (SUBLIMAZE) injection 50 mcg (50 mcg Intravenous Given 09/24/13 0038)    Patient Vitals for the past 24 hrs:  BP Temp Temp src Pulse Resp SpO2 Height Weight  09/23/13 2228 146/90 mmHg 97.9 F (36.6 C) Oral 98 20 99 % 5\' 1"  (1.549 m) 166 lb (75.297 kg)    1:42 AM Reevaluation with update and discussion. After initial assessment and treatment, an updated evaluation reveals she still has 10/ 10 pain. She would like 1 more dose of Dilaudid, and then be admitted. Richarda Blade   4:43 AM-Consult complete with Dr. Hal Hope. Patient case explained and discussed. He agrees to admit patient for further evaluation and treatment. Call ended at [place] Reviewed  CBC WITH DIFFERENTIAL - Abnormal; Notable for the following:    WBC 14.2 (*)    RBC 3.08 (*)    Hemoglobin 9.5 (*)    HCT 27.2 (*)    RDW 17.6 (*)    Platelets 542 (*)    Lymphs Abs 4.1 (*)    Monocytes Absolute 1.7 (*)    All other components within normal limits  RETICULOCYTES - Abnormal; Notable for the following:    Retic Ct Pct 19.1 (*)    RBC. 3.08 (*)    Retic Count, Manual 588.3 (*)    All other components within normal limits  BASIC METABOLIC PANEL  URINALYSIS, ROUTINE W REFLEX MICROSCOPIC    Imaging Review Dg Chest 1 View  09/23/2013   CLINICAL DATA:  Chest pain, sickle cell disease  EXAM: CHEST - 1 VIEW  COMPARISON:  07/30/1948  FINDINGS:  Cardiac shadow is within normal limits. The lungs are clear bilaterally. No focal infiltrate or sizable effusion is seen no acute bony abnormality is noted.  IMPRESSION: No active disease.   Electronically Signed   By: Inez Catalina M.D.   On: 09/23/2013 22:57     EKG Interpretation None      MDM   Final diagnoses:  Sickle cell crisis    Sickle cell crisis, without significant hemolysis. Pain could not be controlled in the emergency department .  Nursing Notes Reviewed/ Care Coordinated, and agree without changes. Applicable Imaging Reviewed.  Interpretation of Laboratory Data incorporated into ED treatment  Plan: Admit    Richarda Blade, MD 09/24/13 [phone]

## 2013-09-24 NOTE — Care Management Note (Unsigned)
    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     Action/Plan:   From home.   Anticipated DC Date:  09/27/2013   Anticipated DC Plan:  [place]  CM consult      Choice offered to / List presented to:             Status of service:  In process, will continue to follow Medicare Important Message given?   (If response is "NO", the following Medicare IM given date fields will be blank) Date Medicare IM given:   Date Additional Medicare IM given:    Discharge Disposition:    Per UR Regulation:  Reviewed for med. necessity/level of care/duration of stay  If discussed at [place] of Stay Meetings, dates discussed:    Comments:

## 2013-09-25 ENCOUNTER — Encounter (HOSPITAL_COMMUNITY): Payer: Self-pay | Admitting: *Deleted

## 2013-09-25 DIAGNOSIS — M87029 Idiopathic aseptic necrosis of unspecified humerus: Secondary | ICD-10-CM

## 2013-09-25 DIAGNOSIS — D5700 Hb-SS disease with crisis, unspecified: Secondary | ICD-10-CM | POA: Diagnosis not present

## 2013-09-25 DIAGNOSIS — D649 Anemia, unspecified: Secondary | ICD-10-CM | POA: Diagnosis not present

## 2013-09-25 DIAGNOSIS — D72829 Elevated white blood cell count, unspecified: Secondary | ICD-10-CM

## 2013-09-25 LAB — CBC WITH DIFFERENTIAL/PLATELET
BASOS PCT: 0 % (ref 0–1)
Basophils Absolute: 0 10*3/uL (ref 0.0–0.1)
EOS PCT: 4 % (ref 0–5)
Eosinophils Absolute: 0.6 10*3/uL (ref 0.0–0.7)
HCT: 22.5 % — ABNORMAL LOW (ref 36.0–46.0)
Hemoglobin: 7.8 g/dL — ABNORMAL LOW (ref 12.0–15.0)
LYMPHS ABS: 3.7 10*3/uL (ref 0.7–4.0)
Lymphocytes Relative: 23 % (ref 12–46)
MCH: 30.7 pg (ref 26.0–34.0)
MCHC: 34.7 g/dL (ref 30.0–36.0)
MCV: 88.6 fL (ref 78.0–100.0)
MONOS PCT: 11 % (ref 3–12)
Monocytes Absolute: 1.8 10*3/uL — ABNORMAL HIGH (ref 0.1–1.0)
Neutro Abs: 10 10*3/uL — ABNORMAL HIGH (ref 1.7–7.7)
Neutrophils Relative %: 62 % (ref 43–77)
PLATELETS: 444 10*3/uL — AB (ref 150–400)
RBC: 2.54 MIL/uL — ABNORMAL LOW (ref 3.87–5.11)
RDW: 18.1 % — ABNORMAL HIGH (ref 11.5–15.5)
WBC: 16.1 10*3/uL — AB (ref 4.0–10.5)

## 2013-09-25 MED ORDER — HYDROMORPHONE 0.3 MG/ML IV SOLN: INTRAVENOUS | Status: DC

## 2013-09-25 MED ORDER — HYDROMORPHONE 0.3 MG/ML IV SOLN
INTRAVENOUS | Status: DC
  Administered 2013-09-25: 21:00:00 via INTRAVENOUS
  Administered 2013-09-25: 6.19 mg via INTRAVENOUS
  Administered 2013-09-26: 5.4 mg via INTRAVENOUS
  Administered 2013-09-26: 07:00:00 via INTRAVENOUS
  Administered 2013-09-26: 0.87 mg via INTRAVENOUS
  Administered 2013-09-26 (×3): via INTRAVENOUS
  Administered 2013-09-26: 10.25 mg via INTRAVENOUS
  Administered 2013-09-26: 2.72 mg via INTRAVENOUS
  Administered 2013-09-26: 03:00:00 via INTRAVENOUS
  Administered 2013-09-26: 11.75 mg via INTRAVENOUS
  Administered 2013-09-27: 7.59 mg via INTRAVENOUS
  Administered 2013-09-27: 01:00:00 via INTRAVENOUS
  Administered 2013-09-27: 7.98 mg via INTRAVENOUS
  Filled 2013-09-25 (×7): qty 25

## 2013-09-25 MED ORDER — SODIUM CHLORIDE 0.45 % IV SOLN: INTRAVENOUS | Status: DC

## 2013-09-25 NOTE — Progress Notes (Addendum)
SICKLE CELL SERVICE PROGRESS NOTE  [name] GXQ:[id number] DOB: 01/19/1991 DOA: 09/23/2013 PCP: Nancy Stingley A., MD  Assessment/Plan: Principal Problem:   Sickle cell crisis Active Problems:   SICKLE CELL ANEMIA   Sickle cell pain crisis  1. Hb SS with Vaso-occlusive Crisis: Pt here with pain 7/10 and still localized to RUE and BLE's.  Will continue PCA, I have added Toradol and resume basal rate overnight. 2. Leukocytosis: Pt has no evidence of an infection. Likely related to crisis. Will continue to observe. Pt is likely non-compliant with Hydrea in light of elevated WBC and platelets. Had discussion with patient today about Hydrea use and she agrees that she has not been compliant. 3.  Amenia: Pt at baseline. No indication for transfusion.  4. 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 Will refer to Orthopedic surgery as an out-patient.   Code Status: Full Code Family Communication: N/A Disposition Plan: Not yet ready for discharge  [name]  Pager [phone]. If 7PM-7AM, please contact night-coverage.  09/25/2013, 8:00 PM  LOS: 2 days   Brief narrative: [name] is a 23 y.o. female with history of sickle cell disease presents with complaints of increasing pain in the right upper and lower extremities since last evening. Patient says that she has been taking her regular sickle cell pain relief medications despite which the pain has not improved. Denies any headache visual symptoms any weakness of the upper or lower extremities any chest pain or shortness of breath. In the ER patient had multiple doses of Dilaudid despite which patient was still having pain and has been admitted for pain relief. Patient has some mild leukocytosis which appears to be chronic but patient is afebrile.      Consultants:  None  Procedures:  None  Antibiotics:  None  HPI/Subjective:  Pt here with pain 7/10  and still localized mostly to RUE and BLE's. She describes pain as throbbing and sharp in nature and non-radiating. Last BM was 2 days ago. Objective: Filed Vitals:   09/25/13 1219 09/25/13 1416 09/25/13 1616 09/25/13 1806  BP:  120/78  118/71  Pulse:  102  99  Temp:  98.2 F (36.8 C)  99 F (37.2 C)  TempSrc:  Oral  Oral  Resp: 13 15 12 17   Height:      Weight:      SpO2: 96% 92% 100% 94%   Weight change:   Intake/Output Summary (Last 24 hours) at 09/25/13 2000 Last data filed at 09/25/13 1917  Gross per 24 hour  Intake 3795.04 ml  Output      0 ml  Net 3795.04 ml    General: Alert, awake, oriented x3, in no acute distress.  HEENT: [place]/AT PEERL, EOMI, anicteric Neck: Trachea midline,  no masses, no thyromegal,y no JVD, no carotid bruit OROPHARYNX:  Moist, No exudate/ erythema/lesions.  Heart: Regular rate and rhythm, without murmurs, rubs, gallops.  Lungs: Clear to auscultation, no wheezing or rhonchi noted.  Abdomen: Soft, nontender, nondistended, positive bowel sounds, no masses no hepatosplenomegaly noted.  Neuro: No focal neurological deficits noted cranial nerves II through XII grossly intact. Strength normal in bilateral upper and lower extremities. Musculoskeletal: No warm swelling or erythema around joints. Psychiatric: Patient alert and oriented x3, good insight and cognition, good recent to remote recall.    Data Reviewed: Basic Metabolic Panel:  Recent Labs Lab 09/23/13 2250 09/24/13 0820  NA 139 138  K 5.1 4.1  CL 102 104  CO2 24 22  GLUCOSE 99 96  BUN 7 5*  CREATININE 0.68 0.59  0.62  CALCIUM 9.2 9.2   Liver Function Tests:  Recent Labs Lab 09/24/13 0820  AST 19  ALT 11  ALKPHOS 56  BILITOT 2.0*  PROT 7.0  ALBUMIN 4.0   No results found for this basename: LIPASE, AMYLASE,  in the last 168 hours No results found for this basename: AMMONIA,  in the last 168 hours CBC:  Recent Labs Lab 09/23/13 2250 09/24/13 0820 09/25/13 0352  WBC  14.2* 21.3* 16.1*  NEUTROABS 7.6 15.4* 10.0*  HGB 9.5* 8.9* 7.8*  HCT 27.2* 25.2* 22.5*  MCV 88.3 88.4 88.6  PLT 542* 543* 444*   Cardiac Enzymes: No results found for this basename: CKTOTAL, CKMB, CKMBINDEX, TROPONINI,  in the last 168 hours BNP (last 3 results) No results found for this basename: PROBNP,  in the last 8760 hours CBG: No results found for this basename: GLUCAP,  in the last 168 hours  No results found for this or any previous visit (from the past 240 hour(s)).   Studies: Dg Chest 1 View  09/23/2013   CLINICAL DATA:  Chest pain, sickle cell disease  EXAM: CHEST - 1 VIEW  COMPARISON:  07/30/1948  FINDINGS: Cardiac shadow is within normal limits. The lungs are clear bilaterally. No focal infiltrate or sizable effusion is seen no acute bony abnormality is noted.  IMPRESSION: No active disease.   Electronically Signed   By: Inez Catalina M.D.   On: 09/23/2013 22:57    Scheduled Meds: . enoxaparin (LOVENOX) injection  40 mg Subcutaneous Q24H  . folic acid  1 mg Oral Daily  . HYDROmorphone PCA 0.3 mg/mL   Intravenous 6 times per day  . hydroxyurea  1,000 mg Oral Daily  . ketorolac  30 mg Intravenous 4 times per day  . methadone  5 mg Oral QHS   Continuous Infusions:    Total time 35 minutes

## 2013-09-25 NOTE — Progress Notes (Signed)
Patient Controlled Analgesia 24 hour Summary  Total mg delivered: 30.67 Number of demands: 58 Number of deliveries: 45

## 2013-09-26 DIAGNOSIS — D5700 Hb-SS disease with crisis, unspecified: Secondary | ICD-10-CM | POA: Diagnosis not present

## 2013-09-26 LAB — CBC WITH DIFFERENTIAL/PLATELET
Basophils Absolute: 0 10*3/uL (ref 0.0–0.1)
Basophils Relative: 0 % (ref 0–1)
Eosinophils Absolute: 0.9 10*3/uL — ABNORMAL HIGH (ref 0.0–0.7)
Eosinophils Relative: 5 % (ref 0–5)
HEMATOCRIT: 21.9 % — AB (ref 36.0–46.0)
Hemoglobin: 7.6 g/dL — ABNORMAL LOW (ref 12.0–15.0)
Lymphocytes Relative: 21 % (ref 12–46)
Lymphs Abs: 3.7 10*3/uL (ref 0.7–4.0)
MCH: 30.6 pg (ref 26.0–34.0)
MCHC: 34.7 g/dL (ref 30.0–36.0)
MCV: 88.3 fL (ref 78.0–100.0)
MONO ABS: 1.8 10*3/uL — AB (ref 0.1–1.0)
Monocytes Relative: 10 % (ref 3–12)
NEUTROS ABS: 11.2 10*3/uL — AB (ref 1.7–7.7)
Neutrophils Relative %: 64 % (ref 43–77)
PLATELETS: 477 10*3/uL — AB (ref 150–400)
RBC: 2.48 MIL/uL — ABNORMAL LOW (ref 3.87–5.11)
RDW: 17.8 % — ABNORMAL HIGH (ref 11.5–15.5)
WBC: 17.6 10*3/uL — ABNORMAL HIGH (ref 4.0–10.5)

## 2013-09-26 LAB — COMPREHENSIVE METABOLIC PANEL
ALBUMIN: 4.1 g/dL (ref 3.5–5.2)
ALK PHOS: 56 U/L (ref 39–117)
ALT: 15 U/L (ref 0–35)
AST: 21 U/L (ref 0–37)
BILIRUBIN TOTAL: 2.7 mg/dL — AB (ref 0.3–1.2)
BUN: 10 mg/dL (ref 6–23)
CHLORIDE: 102 meq/L (ref 96–112)
CO2: 24 meq/L (ref 19–32)
Calcium: 9 mg/dL (ref 8.4–10.5)
Creatinine, Ser: 0.77 mg/dL (ref 0.50–1.10)
GFR calc Af Amer: >90 mL/min (ref >90)
GFR calc non Af Amer: >90 mL/min (ref >90)
Glucose, Bld: 87 mg/dL (ref 70–99)
POTASSIUM: 3.8 meq/L (ref 3.7–5.3)
Sodium: 139 mEq/L (ref 137–147)
Total Protein: 7.6 g/dL (ref 6.0–8.3)

## 2013-09-26 NOTE — Progress Notes (Signed)
Subjective: Patient doing a little better. Pain is down to 7/10. Has basal dose Dilaudid at night as well as her Dilaudid PCA and NSAIDs. Pain is localized to her hips and lower back. No SOB, No NVD, No Diaphoresis.  Objective: Vital signs in last 24 hours: Temp:  [97.7 F (36.5 C)-99 F (37.2 C)] 98.1 F (36.7 C) (05/14 1414) Pulse Rate:  [98-116] 107 (05/14 1414) Resp:  [9-20] 11 (05/14 1414) BP: (101-152)/(64-80) 101/71 mmHg (05/14 1414) SpO2:  [92 %-100 %] 94 % (05/14 1414) Weight change:  Last BM Date: 09/23/13  Intake/Output from previous day: 05/13 0701 - 05/14 0700 In: 1364.8 [P.O.:1200; I.V.:164.8] Out: 2 [Urine:2] Intake/Output this shift:    General appearance: alert, cooperative, appears stated age and no distress Eyes: conjunctivae/corneas clear. PERRL, EOM's intact. Fundi benign. Neck: no adenopathy, no carotid bruit, no JVD, supple, symmetrical, trachea midline and thyroid not enlarged, symmetric, no tenderness/mass/nodules Back: symmetric, no curvature. ROM normal. No CVA tenderness. Resp: clear to auscultation bilaterally Chest wall: no tenderness Cardio: regular rate and rhythm, S1, S2 normal, no murmur, click, rub or gallop GI: soft, non-tender; bowel sounds normal; no masses,  no organomegaly Extremities: extremities normal, atraumatic, no cyanosis or edema Pulses: 2+ and symmetric Skin: Skin color, texture, turgor normal. No rashes or lesions Neurologic: Grossly normal  Lab Results:  Recent Labs  09/25/13 0352 09/26/13 0515  WBC 16.1* 17.6*  HGB 7.8* 7.6*  HCT 22.5* 21.9*  PLT 444* 477*   BMET  Recent Labs  09/24/13 0820 09/26/13 0515  NA 138 139  K 4.1 3.8  CL 104 102  CO2 22 24  GLUCOSE 96 87  BUN 5* 10  CREATININE 0.59  0.62 0.77  CALCIUM 9.2 9.0    Studies/Results: No results found.  Medications: I have reviewed the patient's current medications.  Assessment/Plan: A 23 yo woman admitted with sickle cell painful  crisis.  1. Sickle cell painful crisis: Patient improving. We will discontinue her basal Dilaudid and continue with the current weight-based dosing. Will get PT consult to evalaute and assist patient with her mobility. 2. SS Anemia: stable H/H. Continue to follow. 3. Leucocytosis: due to SS Crisis. Continue monitoring. 4. Right femural head AVN: Continue pain control as well as PT.  LOS: 3 days   Amorina Doerr L Deondrae Mcgrail 09/26/2013, 2:15 PM

## 2013-09-26 NOTE — Progress Notes (Signed)
PCA basal dose has been discontinued per MD order.

## 2013-09-26 NOTE — Evaluation (Signed)
Physical Therapy Evaluation Patient Details Name: [name] MRN: [id number] DOB: 12/22/1990 Today's Date: 09/26/2013   History of Present Illness  23 yo female admitted with sickle cell crisis. hx of chronic bck pain, Gerd, depression  Clinical Impression  Pt requires an exceptional amount of time for activity, requiring pain medication frequently. Pt will benefit from PT to address problems listed.    Follow Up Recommendations No PT follow up    Equipment Recommendations  None recommended by PT    Recommendations for Other Services       Precautions / Restrictions Precautions Precautions: Fall      Mobility  Bed Mobility Overal bed mobility: Independent                Transfers Overall transfer level: Needs assistance Equipment used: Rolling walker (2 wheeled) Transfers: Sit to/from Stand Sit to Stand: Supervision            Ambulation/Gait Ambulation/Gait assistance: Min assist Ambulation Distance (Feet): 75 Feet Assistive device: Rolling walker (2 wheeled) Gait Pattern/deviations: Step-through pattern;Step-to pattern;Wide base of support;Trunk flexed     General Gait Details: leans heavily on RW especially as pain increases, cues for posture  Stairs            Wheelchair Mobility    Modified Rankin (Stroke Patients Only)       Balance                                             Pertinent Vitals/Pain R groin, 7    Home Living Family/patient expects to be discharged to:: Private residence Living Arrangements: Alone Available Help at Discharge: Family;Available PRN/intermittently Type of Home: Apartment Home Access: Stairs to enter Entrance Stairs-Rails: Right Entrance Stairs-Number of Steps: 4 Home Layout: One level Home Equipment: Walker - 2 wheels      Prior Function Level of Independence: Independent with assistive device(s)               Hand Dominance        Extremity/Trunk  Assessment   Upper Extremity Assessment: Overall WFL for tasks assessed           Lower Extremity Assessment: Overall WFL for tasks assessed      Cervical / Trunk Assessment: Normal  Communication      Cognition Arousal/Alertness: Awake/alert Behavior During Therapy: WFL for tasks assessed/performed Overall Cognitive Status: Within Functional Limits for tasks assessed                      General Comments      Exercises        Assessment/Plan    PT Assessment Patient needs continued PT services  PT Diagnosis Difficulty walking;Acute pain;Generalized weakness   PT Problem List Decreased strength;Decreased activity tolerance;Decreased mobility;Decreased safety awareness;Pain  PT Treatment Interventions DME instruction;Gait training;Functional mobility training;Therapeutic activities;Therapeutic exercise;Patient/family education   PT Goals (Current goals can be found in the Care Plan section) Acute Rehab PT Goals Patient Stated Goal: i want to walk PT Goal Formulation: With patient Time For Goal Achievement: 10/10/13 Potential to Achieve Goals: Good    Frequency Min 3X/week   Barriers to discharge        Co-evaluation               End of Session   Activity Tolerance: Patient limited by fatigue;Patient limited  by pain Patient left: in bed;with call bell/phone within reach Nurse Communication: Mobility status         Time: 1645-1800 PT Time Calculation (min): 75 min   Charges:   PT Evaluation $Initial PT Evaluation Tier I: 1 Procedure PT Treatments $Gait Training: 38-52 mins $Self Care/Home Management: 23-37   PT G Codes:          Claretha Cooper 09/26/2013, 6:05 PM Tresa Endo PT [phone]

## 2013-09-27 DIAGNOSIS — M549 Dorsalgia, unspecified: Secondary | ICD-10-CM | POA: Diagnosis not present

## 2013-09-27 DIAGNOSIS — D649 Anemia, unspecified: Secondary | ICD-10-CM | POA: Diagnosis not present

## 2013-09-27 DIAGNOSIS — G8929 Other chronic pain: Secondary | ICD-10-CM

## 2013-09-27 DIAGNOSIS — D5700 Hb-SS disease with crisis, unspecified: Secondary | ICD-10-CM | POA: Diagnosis not present

## 2013-09-27 DIAGNOSIS — M87029 Idiopathic aseptic necrosis of unspecified humerus: Secondary | ICD-10-CM | POA: Diagnosis not present

## 2013-09-27 MED ORDER — HYDROMORPHONE HCL 2 MG PO TABS
2.0000 mg | ORAL_TABLET | Freq: Once | ORAL | Status: AC | PRN
  Administered 2013-09-27: 2 mg via ORAL
  Filled 2013-09-27: qty 1

## 2013-09-27 MED ORDER — HYDROMORPHONE HCL PF 2 MG/ML IJ SOLN
2.0000 mg | INTRAMUSCULAR | Status: AC
  Administered 2013-09-27: 2 mg via INTRAVENOUS
  Filled 2013-09-27: qty 1

## 2013-09-27 MED ORDER — HYDROMORPHONE 0.3 MG/ML IV SOLN
INTRAVENOUS | Status: DC
  Administered 2013-09-27: 10:00:00 via INTRAVENOUS
  Filled 2013-09-27: qty 25

## 2013-09-27 NOTE — Progress Notes (Signed)
PHYSICIAN PROGRESS NOTE  [name] ACZ:[id number] DOB: 07-12-1990 DOA: 09/23/2013  09/27/2013   PCP: MATTHEWS,MICHELLE A., MD  Admission History: [name] is a 23 y.o. female with history of sickle cell disease presents with complaints of increasing pain in the right upper and lower extremities since last evening. Patient says that she has been taking her regular sickle cell pain relief medications despite which the pain has not improved. Denies any headache visual symptoms any weakness of the upper or lower extremities any chest pain or shortness of breath. In the ER patient had multiple doses of Dilaudid despite which patient was still having pain and has been admitted for pain relief. Patient has some mild leukocytosis which appears to be chronic but patient is afebrile.   Assessment/Plan: 1. Hb SS with Vaso-occlusive Crisis: Pt reports slight improvement but still says she not able to manage pain at home,  still localized to RUE and BLE's and lower back. Will ambulate today.  Will continue PCA at reduced dosing.  Resume oral home breakthru medication.  Encouraged incentive spirometry.  Continue Toradol.   2. Leukocytosis: Pt has no evidence of an infection. Likely related to crisis. Will continue to observe. Pt is likely non-compliant with Hydrea in light of elevated WBC and platelets. Had discussion with patient about Hydrea use and she reports that she has not been compliant. 3. Amenia: Pt at baseline. No indication for transfusion.  4. AVN right Humeral Head: Pt Has known AVN and is having pain in the right shoulder more frequently. Will refer to Orthopedic surgery as an out-patient.  Code Status: Full Code  Family Communication: N/A  Disposition Plan: anticipate discharge soon   HPI/Subjective: Pt reports that she is having more pain in her lower back this morning but overall much improved since admission.  She denies chest pain and shortness of breath.  She has not been  ambulating much since admission.  She has been working with PT and has used a walker.  She has not reported a BM.   She is agreeable to being referred to orthopedics as an outpatient.  She says that she will call me later this afternoon if she feels that she can manage her pain at home and request to be discharged.   Objective: Filed Vitals:   09/27/13 0633  BP: 122/81  Pulse:   Temp:   Resp:    Filed Vitals:   09/27/13 0559 09/27/13 0633 09/27/13 0941 09/27/13 0955  BP: 91/59 122/81  127/79  Pulse: 100   103  Temp: 98.7 F (37.1 C)   98.2 F (36.8 C)  TempSrc: Oral   Oral  Resp: 12  16 16   Height:      Weight:      SpO2: 100%  94% 96%    Intake/Output Summary (Last 24 hours) at 09/27/13 0905 Last data filed at 09/27/13 0600  Gross per 24 hour  Intake    480 ml  Output      0 ml  Net    480 ml   Filed Weights   09/23/13 2228 09/24/13 0631  Weight: 166 lb (75.297 kg) 165 lb (74.844 kg)    Review Of Systems: As detailed above, otherwise all reviewed and negative  Exam:  General: Alert, awake, oriented x3, in no acute distress.  HEENT: [place]/AT PEERL, EOMI, mild icterus Neck: Trachea midline, no masses, no thyromegal,y no JVD, no carotid bruit  OROPHARYNX: Moist, No exudate/ erythema/lesions.  Heart: Regular rate and rhythm,  without murmurs, rubs, gallops.  Lungs: Clear to auscultation, no wheezing or rhonchi noted.  Abdomen: Soft, nontender, nondistended, positive bowel sounds, no masses no hepatosplenomegaly noted.  Neuro: No focal neurological deficits noted cranial nerves II through XII grossly intact. Strength normal in bilateral upper and lower extremities.  Musculoskeletal: No warm swelling or erythema around joints.  Psychiatric: Patient alert and oriented x3, good insight and cognition, good recent to remote recall.  Data Reviewed: Basic Metabolic Panel:  Recent Labs Lab 09/23/13 2250 09/24/13 0820 09/26/13 0515  NA 139 138 139  K 5.1 4.1 3.8  CL 102  104 102  CO2 24 22 24   GLUCOSE 99 96 87  BUN 7 5* 10  CREATININE 0.68 0.59  0.62 0.77  CALCIUM 9.2 9.2 9.0   Liver Function Tests:  Recent Labs Lab 09/24/13 0820 09/26/13 0515  AST 19 21  ALT 11 15  ALKPHOS 56 56  BILITOT 2.0* 2.7*  PROT 7.0 7.6  ALBUMIN 4.0 4.1   No results found for this basename: LIPASE, AMYLASE,  in the last 168 hours No results found for this basename: AMMONIA,  in the last 168 hours CBC:  Recent Labs Lab 09/23/13 2250 09/24/13 0820 09/25/13 0352 09/26/13 0515  WBC 14.2* 21.3* 16.1* 17.6*  NEUTROABS 7.6 15.4* 10.0* 11.2*  HGB 9.5* 8.9* 7.8* 7.6*  HCT 27.2* 25.2* 22.5* 21.9*  MCV 88.3 88.4 88.6 88.3  PLT 542* 543* 444* 477*   Cardiac Enzymes: No results found for this basename: CKTOTAL, CKMB, CKMBINDEX, TROPONINI,  in the last 168 hours BNP (last 3 results) No results found for this basename: PROBNP,  in the last 8760 hours CBG: No results found for this basename: GLUCAP,  in the last 168 hours  No results found for this or any previous visit (from the past 240 hour(s)).   Studies: No results found.  Scheduled Meds: . enoxaparin (LOVENOX) injection  40 mg Subcutaneous Q24H  . folic acid  1 mg Oral Daily  .  HYDROmorphone (DILAUDID) injection  2 mg Intravenous NOW  . HYDROmorphone PCA 0.3 mg/mL   Intravenous 6 times per day  . hydroxyurea  1,000 mg Oral Daily  . ketorolac  30 mg Intravenous 4 times per day  . methadone  5 mg Oral QHS   Continuous Infusions: . sodium chloride 10 mL/hr at 09/25/13 2016   Principal Problem:   Sickle cell crisis Active Problems:   SICKLE CELL ANEMIA   Sickle cell pain crisis  [facility] Pager [phone].  If 7PM-7AM, please contact night-coverage at [email or web address], password [facility] 09/27/2013, 9:05 AM  LOS: 4 days

## 2013-09-27 NOTE — Progress Notes (Signed)
Pt discharged home in stable condition.  NO change from morning assessment.  Discharge instructions provided with verbal understanding.  No questions or concerns at this time.

## 2013-09-27 NOTE — Discharge Summary (Addendum)
PHYSICIAN DISCHARGE SUMMARY   [name] ASN:[id number] DOB: 1990/12/12 DOA: 09/23/2013  PCP: MATTHEWS,MICHELLE A., MD  Admit date: 09/23/2013 Discharge date: 09/27/2013  Time spent: 31 minutes  Recommendations for Outpatient Follow-up:  1. Please refer to orthopedics at outpatient follow up  2. Please consider rechecking CBC and CMP on outpatient follow up  Discharge Diagnoses:     Sickle cell crisis   SICKLE CELL ANEMIA   Sickle cell pain crisis   Avascular Necrosis Right Humeral Head  Discharge Condition: stable   Diet recommendation: resume prior home diet   Filed Weights   09/23/13 2228 09/24/13 0631  Weight: 166 lb (75.297 kg) 165 lb (74.844 kg)    History of present illness:  [name] is a 23 y.o. female with history of sickle cell disease presents with complaints of increasing pain in the right upper and lower extremities since last evening. Patient says that she has been taking her regular sickle cell pain relief medications despite which the pain has not improved. Denies any headache visual symptoms any weakness of the upper or lower extremities any chest pain or shortness of breath. In the ER patient had multiple doses of Dilaudid despite which patient was still having pain and has been admitted for pain relief. Patient has some mild leukocytosis which appears to be chronic but patient is afebrile.    Hospital Course:  1. Hb SS with Vaso-occlusive Crisis: Pt reports slight improvement but still says she is now able to manage pain at home, Ambulating today.  Resume oral pain medication. Encouraged incentive spirometry.  2. Leukocytosis: Pt has no evidence of an infection. Likely related to crisis. Will continue to observe. Pt is likely non-compliant with Hydrea in light of elevated WBC and platelets. Had discussion with patient about Hydrea use and she reports that she has not been compliant. Encouraged patient to take her meds as prescribed and to follow up  closely with Dr. Zigmund Daniel office.  3. Amenia:  Stable Hg. Near baseline. No indication for transfusion.  4. AVN right Humeral Head: Pt Has known AVN and is having pain in the right shoulder more frequently. Will refer to Orthopedic surgery as an out-patient.  Code Status: Full Code  Family Communication: N/A  Disposition Plan: anticipate discharge soon   Consultations:  Physical Therapy  Discharge Exam: Pt reports that she is feeling much better and would like to be discharged home.  She says that she feels that she can manage her pain at home with her home pain meds.   Filed Vitals:   09/27/13 0955  BP: 127/79  Pulse: 103  Temp: 98.2 F (36.8 C)  Resp: 16   General: Alert, awake, oriented x3, in no acute distress.  HEENT: /AT PEERL, EOMI, mild icterus  Neck: Trachea midline, no masses, no thyromegal,y no JVD, no carotid bruit  OROPHARYNX: Moist, No exudate/ erythema/lesions.  Heart: Regular rate and rhythm, without murmurs, rubs, gallops.  Lungs: Clear to auscultation, no wheezing or rhonchi noted.  Abdomen: Soft, nontender, nondistended, positive bowel sounds, no masses no hepatosplenomegaly noted.  Neuro: No focal neurological deficits noted cranial nerves II through XII grossly intact. Strength normal in bilateral upper and lower extremities.  Musculoskeletal: No warm swelling or erythema around joints.  Psychiatric: Patient alert and oriented x3, good insight and cognition, good recent to remote recall.    Medication List         hydroxyurea 500 MG capsule  Commonly known as:  HYDREA  Take 1  capsule (500 mg total) by mouth 2 (two) times daily. May take with food to minimize GI side effects.     methadone 5 MG tablet  Commonly known as:  DOLOPHINE  Take 1 tablet (5 mg total) by mouth at bedtime.     Oxycodone HCl 10 MG Tabs  Take 1.5 tablets (15 mg total) by mouth every 6 (six) hours as needed (pain).       Discharge Instructions    Medication List    ASK  your doctor about these medications       hydroxyurea 500 MG capsule  Commonly known as:  HYDREA  Take 1 capsule (500 mg total) by mouth 2 (two) times daily. May take with food to minimize GI side effects.     methadone 5 MG tablet  Commonly known as:  DOLOPHINE  Take 1 tablet (5 mg total) by mouth at bedtime.     Oxycodone HCl 10 MG Tabs  Take 1.5 tablets (15 mg total) by mouth every 6 (six) hours as needed (pain).       Allergies  Allergen Reactions  . Carrot Oil Hives and Swelling  . Latex Rash    The results of significant diagnostics from this hospitalization (including imaging, microbiology, ancillary and laboratory) are listed below for reference.    Significant Diagnostic Studies: Dg Chest 1 View  09/23/2013   CLINICAL DATA:  Chest pain, sickle cell disease  EXAM: CHEST - 1 VIEW  COMPARISON:  07/30/1948  FINDINGS: Cardiac shadow is within normal limits. The lungs are clear bilaterally. No focal infiltrate or sizable effusion is seen no acute bony abnormality is noted.  IMPRESSION: No active disease.   Electronically Signed   By: Inez Catalina M.D.   On: 09/23/2013 22:57   Microbiology: No results found for this or any previous visit (from the past 240 hour(s)).   Labs: Basic Metabolic Panel:  Recent Labs Lab 09/23/13 2250 09/24/13 0820 09/26/13 0515  NA 139 138 139  K 5.1 4.1 3.8  CL 102 104 102  CO2 24 22 24   GLUCOSE 99 96 87  BUN 7 5* 10  CREATININE 0.68 0.59  0.62 0.77  CALCIUM 9.2 9.2 9.0   Liver Function Tests:  Recent Labs Lab 09/24/13 0820 09/26/13 0515  AST 19 21  ALT 11 15  ALKPHOS 56 56  BILITOT 2.0* 2.7*  PROT 7.0 7.6  ALBUMIN 4.0 4.1   No results found for this basename: LIPASE, AMYLASE,  in the last 168 hours No results found for this basename: AMMONIA,  in the last 168 hours CBC:  Recent Labs Lab 09/23/13 2250 09/24/13 0820 09/25/13 0352 09/26/13 0515  WBC 14.2* 21.3* 16.1* 17.6*  NEUTROABS 7.6 15.4* 10.0* 11.2*  HGB 9.5*  8.9* 7.8* 7.6*  HCT 27.2* 25.2* 22.5* 21.9*  MCV 88.3 88.4 88.6 88.3  PLT 542* 543* 444* 477*   Cardiac Enzymes: No results found for this basename: CKTOTAL, CKMB, CKMBINDEX, TROPONINI,  in the last 168 hours BNP: BNP (last 3 results) No results found for this basename: PROBNP,  in the last 8760 hours CBG: No results found for this basename: GLUCAP,  in the last 168 hours  Signed:  [facility] 09/27/2013, 11:48 AM

## 2013-09-27 NOTE — Discharge Instructions (Signed)
Avascular Necrosis Avascular necrosis is a disease resulting from the temporary or permanent loss of the blood supply to the bones. Without blood, the bone tissue dies and causes the bone to become soft. If the process involves the bone near a joint, it may lead to collapse of the joint surface. This disease is also known as:  Osteonecrosis.  Aseptic necrosis.  Ischemic bone necrosis. Avascular necrosis most commonly affects the ends (epiphysis) of long bones. The femur, the bone extending from the knee joint to the hip joint, is the bone most commonly involved. The disease may affect 1 bone, more than 1 bone at the same time, more than 1 bone at different times. It affects men and women equally. Avascular necrosis occurs at any age. But it is more common between the ages of 56 and 47 years. SYMPTOMS  In early stages patients may not have any symptoms. But as the disease progresses, joint pain generally develops. At first there is pain when putting weight on the affected joint, and then when resting. Pain usually develops gradually. It may be mild or severe. As the disease progresses and the bone and surrounding joint surface collapses, pain may develop or increase dramatically. Pain may be severe enough to limit range of motion in the affected joint. The period of time between the first symptoms and loss of joint function is different for each patient. This can range from several months to more than a year. Disability depends on:  What part of the bone is affected.  How large an area is involved.  How effectively the bone repairs itself.  If other illnesses are present.  If you are being treated for cancer with medications (chemotherapy).  Radiation.  The cause of the avascular necrosis. DIAGNOSIS  The diagnosis of aseptic necrosis is usually made by:  Taking a history.  Doing an exam.  Taking X-rays. (If X-rays are normal, an MRI may be required.)  Sometimes further blood work and  specialized studies may be necessary. TREATMENT  Treatment for this disease is necessary to maintain joint function. If untreated, most patients will suffer severe pain and limitation in movement within 2 years. Several treatments are available that help prevent further bone and joint damage. They can also reduce pain. To determine the most appropriate treatment, the caregiver considers the following aspects of a patient's disease:  The age of the patient.  The stage of the disease (early or late).  The location and amount of bone affected. It may be a small or large area.  The underlying cause of avascular necrosis. The goals in treatment are to:  Improve the patient's use of the affected joint.  Stop further damage to the bone.  Improve bone and joint survival. Your caregiver may use one or more of the following treatments:  Reduced weight bearing. If avascular necrosis is diagnosed early, the caregiver may begin treatment by having the patient limit weight on the affected joint. The caregiver may recommend limiting activities or using crutches. In some cases, reduced weight bearing can slow the damage caused by the disease and permit natural healing. When combined with medication to reduce pain, reduced weight bearing can be an effective way to avoid or delay surgery for some patients. Most patients eventually will need surgery to reconstruct the joint.  Core decompression. Core decompression works best in people who are in the earliest stages of avascular necrosis, before the collapse of the joint. This procedure often can reduce pain and slow the progression  of bone and joint destruction in these patients. This surgical procedure removes the inner layer of bone, which:  Reduces pressure within the bone.  Increases blood flow to the bone.  Allows more blood vessels to form.  Reduces pain.  Osteotomy. This surgical procedure re-shapes the bone to reduce stress on the affected area  of the joint. There is a lengthy recovery period. The patient's activities are very limited for 3 to 12 months after an osteotomy. This procedure is most effective for younger patients with advanced avascular necrosis, and those with a large area of affected bone.  Bone Graft. A bone graft may be used to support a joint after core decompression. Bone grafting is surgery that transplants healthy bone from one part of the patient, such as the leg, to the diseased area. Sometimes the bone is taken with it's blood vessels which are attached to local blood vessels near the area of bone collapse. This is called a vascularized bone graft. There is a lengthy recovery period after a bone graft, usually from 6 to 12 months. This procedure is technically complex.  Arthroplasty. Arthroplasty is also known as total joint replacement. Total joint replacement is used in late-stage avascular necrosis, and when the joint is deformed. In this surgery, the diseased joint is replaced with artificial parts. It may be recommended for people who are not good candidates for other treatments, such as patients who may not do well with repeated attempts to preserve the joint. Various types of replacements are available, and patients should discuss specific needs with their caregiver. New treatments being tried include:  The use of medications.  Electrical stimulation.  Combination therapies to increase the growth of new bone and blood vessels. Document Released: 10/22/2001 Document Revised: 07/25/2011 Document Reviewed: 12/23/2008 [facility] Patient Information 2014 [place]. Sickle Cell Anemia, Adult Sickle cell anemia is a condition where your red blood cells are shaped like sickles. Red blood cells carry oxygen through the body. Sickle-shaped red blood cells cells do not live as long as normal red blood cells. They also clump together and block blood from flowing through the blood vessels. These things prevent the body  from getting enough oxygen. Sickle cell anemia causes organ damage and pain. It also increases the risk of infection. HOME CARE  Drink enough fluid to keep your pee (urine) clear or pale yellow. Drink more in hot weather and during exercise.  Do not smoke. Smoking lowers oxygen levels in the blood.  Only take over-the-counter or prescription medicines as told by your doctor.  Take antibiotic medicines as told by your doctor. Make sure you finish them it even if you start to feel better.  Take supplements as told by your doctor.  Consider wearing a medical alert bracelet. This tells anyone caring for you in an emergency of your condition.  When traveling, keep your medical information, doctors' names, and the medicines you take with you at all times.  If you have a fever, do not take fever medicines right away. This could cover up a problem. Tell your doctor.   Keep all follow-up appointments with your doctor. Sickle cell anemia requires regular medical care. GET HELP IF: You have a fever. GET HELP RIGHT AWAY IF:  You feel dizzy or faint.  You have new belly (abdominal) pain, especially on the left side near the stomach area.  You have a lasting, often uncomfortable and painful erection of the penis (priapism). If it is not treated right away, you will become  unable to have sex (impotence).  You have numbness your arms or legs or you have a hard time moving them.  You have a hard time talking.  You have a fever or lasting symptoms for more than 2 3 days.  You have a fever and your symptoms suddenly get worse.  You have signs or symptoms of infection. These include:  Chills.  Being more tired than normal (lethargy).  Irritability.  Poor eating.  Throwing up (vomiting).  You have pain that is not helped with medicine.  You have shortness of breath.  You have pain in your chest.  You are coughing up pus-like or bloody mucus.  You have a stiff neck.  Your feet  or hands swell or have pain.  Your belly looks bloated.  Your joints hurt. MAKE SURE YOU:  Understand these instructions.  Will watch your condition.  Will get help right away if you are not doing well or get worse. Document Released: 02/20/2013 Document Reviewed: 12/12/2012 [facility] Patient Information 2014 [place].

## 2013-09-30 ENCOUNTER — Telehealth: Payer: Self-pay | Admitting: Internal Medicine

## 2013-09-30 DIAGNOSIS — D571 Sickle-cell disease without crisis: Secondary | ICD-10-CM

## 2013-09-30 MED ORDER — METHADONE HCL 5 MG PO TABS: 5.0000 mg | ORAL_TABLET | Freq: Every day | ORAL | Status: DC

## 2013-09-30 MED ORDER — OXYCODONE HCL 10 MG PO TABS: 15.0000 mg | ORAL_TABLET | Freq: Four times a day (QID) | ORAL | Status: DC | PRN

## 2013-09-30 NOTE — Telephone Encounter (Signed)
Methodone 5 mg at bedtime daily #30 Oxycodone 10 mg  Take 1.5 tablets every 6 hours as needed for severe pain #90. Rx not to be filled until 10/04/2013   Reviewed [place] Substance Reporting system prior to reorder

## 2013-09-30 NOTE — Telephone Encounter (Signed)
Medication refill request for methadone (DOLOPHINE) 5 MG tablet,Oxycodone HCl 10 MG TABS / LOV 08/27/2013

## 2013-10-14 ENCOUNTER — Telehealth: Payer: Self-pay | Admitting: Internal Medicine

## 2013-10-14 MED ORDER — OXYCODONE HCL 10 MG PO TABS: 15.0000 mg | ORAL_TABLET | Freq: Four times a day (QID) | ORAL | Status: DC | PRN

## 2013-10-14 NOTE — Telephone Encounter (Signed)
Medication refill request for hydroxyurea (HYDREA) 500 MG capsule,Oxycodone HCl 10 MG TABS / LOV 08/27/2013

## 2013-10-14 NOTE — Telephone Encounter (Signed)
Re-ordered Oxycodone 15 mg 1.5 tablets every 6 hours as needed for severe pain #90  Reviewed [place] Substance Reporting system prior to reorder

## 2013-10-28 ENCOUNTER — Encounter: Payer: Self-pay | Admitting: Internal Medicine

## 2013-10-28 ENCOUNTER — Other Ambulatory Visit: Payer: Self-pay | Admitting: Internal Medicine

## 2013-10-28 ENCOUNTER — Telehealth: Payer: Self-pay | Admitting: Internal Medicine

## 2013-10-28 ENCOUNTER — Ambulatory Visit (INDEPENDENT_AMBULATORY_CARE_PROVIDER_SITE_OTHER): Payer: Medicare Other | Admitting: Internal Medicine

## 2013-10-28 VITALS — BP 104/60 | HR 98 | Temp 98.4°F | Resp 20 | Ht 62.0 in | Wt 161.0 lb

## 2013-10-28 DIAGNOSIS — E559 Vitamin D deficiency, unspecified: Secondary | ICD-10-CM

## 2013-10-28 DIAGNOSIS — D571 Sickle-cell disease without crisis: Secondary | ICD-10-CM | POA: Diagnosis not present

## 2013-10-28 DIAGNOSIS — F1110 Opioid abuse, uncomplicated: Secondary | ICD-10-CM

## 2013-10-28 DIAGNOSIS — Z0000 Encounter for general adult medical examination without abnormal findings: Secondary | ICD-10-CM

## 2013-10-28 DIAGNOSIS — F1190 Opioid use, unspecified, uncomplicated: Secondary | ICD-10-CM

## 2013-10-28 MED ORDER — METHADONE HCL 5 MG PO TABS: 5.0000 mg | ORAL_TABLET | Freq: Every day | ORAL | Status: DC

## 2013-10-28 MED ORDER — HYDROMORPHONE HCL 4 MG PO TABS: 4.0000 mg | ORAL_TABLET | Freq: Four times a day (QID) | ORAL | Status: DC | PRN

## 2013-10-28 MED ORDER — FOLIC ACID 1 MG PO TABS: 1.0000 mg | ORAL_TABLET | Freq: Every day | ORAL | Status: DC

## 2013-10-28 MED ORDER — HYDROXYUREA 500 MG PO CAPS: 500.0000 mg | ORAL_CAPSULE | Freq: Two times a day (BID) | ORAL | Status: DC

## 2013-10-28 NOTE — Telephone Encounter (Signed)
Prescription refilled for Methadone. Oxycodone changed to Hydromorphone as Oxycodone not formulary and Hydromorphone is. Dose converted to 75% of morphine equivalent for cross-reactivity. Pt was at MED of 22.5 mg/dose with oxycodone and now at MED of 16 mg/dose with Hydrocodone. May require further titration if pain control inadequate with current dose.

## 2013-10-28 NOTE — Telephone Encounter (Signed)
Refill request for methadone (DOLOPHINE) 5 MG tablet,Oxycodone HCl 10 MG TABS / LOV 10/28/2013 / Hydrea sent electronically to [facility]

## 2013-10-28 NOTE — Progress Notes (Signed)
Subjective:    Patient ID: [name], female    DOB: Nov 21, 1990, 23 y.o.   MRN: [id number]  HPI: Pt here today for her 3 month follow- up. She reports that she has been doing well and that she has been compliant with taking her Hydrea. 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 She expresses pride in her accomplishment.She reports that she has been out of her Folic Acid for about 1 month and has been forgetting to buy it as it is an OTC medication for her.   She also reports that she has been celibate in the last few months and still has not made a decision about the HPV vaccine. She wants to defer the contraception and see how she does with her periods during the summer as she reports that they have been light and has not recently triggered a crisis.    Review of Systems  Constitutional: Negative.   HENT: Negative.   Eyes: Negative.   Respiratory: Negative.   Cardiovascular: Negative.   Gastrointestinal: Negative.   Endocrine: Negative.   Genitourinary: Negative.   Skin: Negative.   Allergic/Immunologic: Negative.   Neurological: Negative.   Hematological: Negative.   Psychiatric/Behavioral: Negative.        Objective:   Physical Exam  Vitals reviewed. Constitutional: She is oriented to person, place, and time. She appears well-developed and well-nourished.  HENT:  Head: Normocephalic and atraumatic.  Eyes: Conjunctivae and EOM are normal. Pupils are equal, round, and reactive to light. No scleral icterus.  Neck: Normal range of motion. Neck supple. No JVD present. No thyromegaly present.  Cardiovascular: Normal rate and regular rhythm.  Exam reveals no gallop and no friction rub.   No murmur heard. Pulmonary/Chest: Effort normal and breath sounds normal. She has no wheezes. She has no rales.  Abdominal: Soft. Bowel sounds are normal. She exhibits no distension and no mass. There is no tenderness.   Musculoskeletal: Normal range of motion.  Neurological: She is alert and oriented to person, place, and time. No cranial nerve deficit.  Skin: Skin is warm and dry.  Psychiatric: She has a normal mood and affect. Her behavior is normal. Judgment and thought content normal.    BP 104/60  Pulse 98  Temp(Src) 98.4 F (36.9 C) (Oral)  Resp 20  Ht 5\' 2"  (1.575 m)  Wt 161 lb (73.029 kg)  BMI 29.44 kg/m2  LMP 10/04/2013       Assessment & Plan:  1. Sickle cell disease - Continue Hydrea 1000 mg daily. We discussed the need for good hydration, monitoring of hydration status, avoidance of heat, cold, stress, and infection triggers. We discussed the risks and benefits of Hydrea, including bone marrow suppression, the possibility of GI upset, skin ulcers, hair thinning, and teratogenicity. The patient was reminded of the need to seek medical attention of any symptoms of bleeding, anemia, or infection. Pt has not been adherent to folic acid use in the last month. Continue folic acid 1 mg daily to prevent aplastic bone marrow crises.   2. Pulmonary evaluation - Patient denies severe recurrent wheezes, shortness of breath with exercise, or persistent cough. If these symptoms develop, pulmonary function tests with spirometry will be ordered, and if abnormal, plan on referral to Pulmonology for further evaluation.  3. Cardiac - Routine screening for pulmonary hypertension is not recommended.  4. Eye - High risk of proliferative retinopathy.  Annual eye exam with retinal exam recommended to patient.  5. Immunization status - She declines vaccines today. Yearly influenza vaccination is recommended, as well as being up to date with Meningococcal and Pneumococcal vaccines.   6. Acute and chronic painful episodes - Pt continues on Methadone 5 mg Q HS and Oxycodone 15 mg q 6 hours as needed.   We discussed that she is to receive her Schedule II prescriptions only from [place]. She is also aware that her  prescription history is available to [place] online through the [place]. Controlled substance agreement signed (Date). We reminded (Pt) that all patients receiving Schedule II narcotics must be seen for follow up every three months. We reviewed the terms of our pain agreement, including the need to keep medicines in a safe locked location away from children or pets, and the need to report excess sedation or constipation, measures to avoid constipation, and policies related to early refills and stolen prescriptions. According to the [place] [facility] program, we have reviewed details related to analgesia, adverse effects, aberrant behaviors.   7. Iron overload from chronic transfusion. Check Ferritin ;levels today  8. Vitamin D deficiency - Pt has had low vitamin D in 2013. She has been only intermittently adherent to vitamin D supplements. Will check vitamin D today and adjust as indicated.    The above recommendations are taken from the NIH Evidence-Based Management of Sickle Cell Disease: Expert Panel Report, 20149.   9. Health Maintenance: Gave patient information on HPV vaccine. Will order fasting lipid profile to be done at a later date as patient has already eaten today.

## 2013-10-29 LAB — URINALYSIS
BILIRUBIN URINE: NEGATIVE
GLUCOSE, UA: NEGATIVE mg/dL
HGB URINE DIPSTICK: NEGATIVE
Ketones, ur: NEGATIVE mg/dL
Nitrite: NEGATIVE
Protein, ur: NEGATIVE mg/dL
Specific Gravity, Urine: 1.013 (ref 1.005–1.030)
UROBILINOGEN UA: 0.2 mg/dL (ref 0.0–1.0)
pH: 6 (ref 5.0–8.0)

## 2013-10-29 LAB — COMPREHENSIVE METABOLIC PANEL
ALBUMIN: 4.6 g/dL (ref 3.5–5.2)
ALT: 13 U/L (ref 0–35)
AST: 26 U/L (ref 0–37)
Alkaline Phosphatase: 48 U/L (ref 39–117)
BUN: 10 mg/dL (ref 6–23)
CALCIUM: 9.4 mg/dL (ref 8.4–10.5)
CO2: 20 meq/L (ref 19–32)
CREATININE: 0.59 mg/dL (ref 0.50–1.10)
Chloride: 104 mEq/L (ref 96–112)
Glucose, Bld: 68 mg/dL — ABNORMAL LOW (ref 70–99)
POTASSIUM: 4.4 meq/L (ref 3.5–5.3)
Sodium: 137 mEq/L (ref 135–145)
Total Bilirubin: 1.7 mg/dL — ABNORMAL HIGH (ref 0.2–1.2)
Total Protein: 7.2 g/dL (ref 6.0–8.3)

## 2013-10-29 LAB — CBC WITH DIFFERENTIAL/PLATELET
BASOS PCT: 1 % (ref 0–1)
Basophils Absolute: 0.1 10*3/uL (ref 0.0–0.1)
Eosinophils Absolute: 0.2 10*3/uL (ref 0.0–0.7)
Eosinophils Relative: 2 % (ref 0–5)
HEMATOCRIT: 30.9 % — AB (ref 36.0–46.0)
Hemoglobin: 10.3 g/dL — ABNORMAL LOW (ref 12.0–15.0)
LYMPHS ABS: 2.4 10*3/uL (ref 0.7–4.0)
Lymphocytes Relative: 26 % (ref 12–46)
MCH: 32.2 pg (ref 26.0–34.0)
MCHC: 33.3 g/dL (ref 30.0–36.0)
MCV: 96.6 fL (ref 78.0–100.0)
Monocytes Absolute: 1.2 10*3/uL — ABNORMAL HIGH (ref 0.1–1.0)
Monocytes Relative: 13 % — ABNORMAL HIGH (ref 3–12)
NEUTROS ABS: 5.4 10*3/uL (ref 1.7–7.7)
NEUTROS PCT: 58 % (ref 43–77)
Platelets: 559 10*3/uL — ABNORMAL HIGH (ref 150–400)
RBC: 3.2 MIL/uL — AB (ref 3.87–5.11)
RDW: 18.3 % — ABNORMAL HIGH (ref 11.5–15.5)
WBC: 9.3 10*3/uL (ref 4.0–10.5)

## 2013-10-29 LAB — RETICULOCYTES
ABS RETIC: 342.4 10*3/uL — AB (ref 19.0–186.0)
RBC.: 3.2 MIL/uL — ABNORMAL LOW (ref 3.87–5.11)
RETIC CT PCT: 10.7 % — AB (ref 0.4–2.3)

## 2013-10-29 LAB — FERRITIN: Ferritin: 396 ng/mL — ABNORMAL HIGH (ref 10–291)

## 2013-10-30 LAB — OPIATES/OPIOIDS (LC/MS-MS)
Codeine Urine: NEGATIVE ng/mL (ref <50)
Hydrocodone: NEGATIVE ng/mL (ref <50)
Hydromorphone: NEGATIVE ng/mL (ref <50)
Morphine Urine: NEGATIVE ng/mL (ref <50)
NORHYDROCODONE, UR: NEGATIVE ng/mL (ref <50)
NOROXYCODONE, UR: 2810 ng/mL — AB (ref <50)
OXYCODONE, UR: 1242 ng/mL — AB (ref <50)
OXYMORPHONE, URINE: 2424 ng/mL — AB (ref <50)

## 2013-10-30 LAB — OXYCODONE, URINE (LC/MS-MS)
NOROXYCODONE, UR: 2810 ng/mL — AB (ref <50)
Oxycodone, ur: 1242 ng/mL — AB (ref <50)
Oxymorphone: 2424 ng/mL — AB (ref <50)

## 2013-10-31 LAB — PRESCRIPTION MONITORING PROFILE (SOLSTAS)
Amphetamine/Meth: NEGATIVE ng/mL
BARBITURATE SCREEN, URINE: NEGATIVE ng/mL
BUPRENORPHINE, URINE: NEGATIVE ng/mL
Benzodiazepine Screen, Urine: NEGATIVE ng/mL
CANNABINOID SCRN UR: NEGATIVE ng/mL
CARISOPRODOL, URINE: NEGATIVE ng/mL
COCAINE METABOLITES: NEGATIVE ng/mL
Creatinine, Urine: 91.29 mg/dL (ref >20.0)
Fentanyl, Ur: NEGATIVE ng/mL
MDMA URINE: NEGATIVE ng/mL
MEPERIDINE UR: NEGATIVE ng/mL
Methadone Screen, Urine: NEGATIVE ng/mL
Nitrites, Initial: NEGATIVE ug/mL
Propoxyphene: NEGATIVE ng/mL
TAPENTADOLUR: NEGATIVE ng/mL
Tramadol Scrn, Ur: NEGATIVE ng/mL
Zolpidem, Urine: NEGATIVE ng/mL
pH, Initial: 6 pH (ref 4.5–8.9)

## 2013-11-01 ENCOUNTER — Telehealth: Payer: Self-pay

## 2013-11-01 ENCOUNTER — Other Ambulatory Visit: Payer: Self-pay | Admitting: Internal Medicine

## 2013-11-01 ENCOUNTER — Telehealth: Payer: Self-pay | Admitting: Internal Medicine

## 2013-11-01 DIAGNOSIS — D571 Sickle-cell disease without crisis: Secondary | ICD-10-CM

## 2013-11-01 DIAGNOSIS — F1190 Opioid use, unspecified, uncomplicated: Secondary | ICD-10-CM

## 2013-11-01 MED ORDER — OXYCODONE HCL 10 MG PO TABS: 15.0000 mg | ORAL_TABLET | Freq: Four times a day (QID) | ORAL | Status: DC | PRN

## 2013-11-01 NOTE — Telephone Encounter (Signed)
Patient called requesting provider change RX from Dilaudid to Oxycodone.

## 2013-11-01 NOTE — Telephone Encounter (Signed)
Call Documentation     CORNELIOUS DIVEN at 11/01/2013 9:41 AM     Status: Signed        Patient called requesting Adis Sturgill change RX from Dilaudid to Oxycodone   Pt states she does not like Med.Plz Advise

## 2013-11-01 NOTE — Telephone Encounter (Signed)
Oxycodone is no longer being covered by her insurance. I am happy to return to Oxycodone if she is willing to pay out of pocket.

## 2013-11-01 NOTE — Telephone Encounter (Signed)
Pt requesting that medication be changed back to Oxycodone instead of dilaudid. She states that dilaudid makes her too drowsy and she does not like it's effects. SHe is aware that her insurance is not covering Oxycodone and she states that she will pay out of pocket for it.  Random prescription drug monitoring screen performed today.

## 2013-11-05 LAB — OPIATES/OPIOIDS (LC/MS-MS)
Codeine Urine: NEGATIVE ng/mL (ref <50)
HYDROMORPHONE: 3173 ng/mL — AB (ref <50)
Hydrocodone: NEGATIVE ng/mL (ref <50)
Morphine Urine: NEGATIVE ng/mL (ref <50)
Norhydrocodone, Ur: NEGATIVE ng/mL (ref <50)
Noroxycodone, Ur: 79 ng/mL — AB (ref <50)
Oxycodone, ur: NEGATIVE ng/mL (ref <50)
Oxymorphone: NEGATIVE ng/mL (ref <50)

## 2013-11-05 LAB — COCAINE METABOLITE (GC/LC/MS), URINE: Benzoylecgonine GC/MS Conf: 10232 ng/mL — AB (ref <100)

## 2013-11-06 LAB — PRESCRIPTION MONITORING PROFILE (SOLSTAS)
Amphetamine/Meth: NEGATIVE ng/mL
BARBITURATE SCREEN, URINE: NEGATIVE ng/mL
BUPRENORPHINE, URINE: NEGATIVE ng/mL
Benzodiazepine Screen, Urine: NEGATIVE ng/mL
CANNABINOID SCRN UR: NEGATIVE ng/mL
Carisoprodol, Urine: NEGATIVE ng/mL
Creatinine, Urine: 54.57 mg/dL (ref >20.0)
Fentanyl, Ur: NEGATIVE ng/mL
MDMA URINE: NEGATIVE ng/mL
MEPERIDINE UR: NEGATIVE ng/mL
Methadone Screen, Urine: NEGATIVE ng/mL
Nitrites, Initial: NEGATIVE ug/mL
Oxycodone Screen, Ur: NEGATIVE ng/mL
Propoxyphene: NEGATIVE ng/mL
TRAMADOL UR: NEGATIVE ng/mL
Tapentadol, urine: NEGATIVE ng/mL
Zolpidem, Urine: NEGATIVE ng/mL
pH, Initial: 7.5 pH (ref 4.5–8.9)

## 2013-11-12 ENCOUNTER — Telehealth: Payer: Self-pay | Admitting: Internal Medicine

## 2013-11-12 DIAGNOSIS — D571 Sickle-cell disease without crisis: Secondary | ICD-10-CM

## 2013-11-12 NOTE — Telephone Encounter (Signed)
Prescription refill request for Oxycodone / LOV 10/28/2013 with Dr. Zigmund Daniel

## 2013-11-13 MED ORDER — OXYCODONE HCL 10 MG PO TABS: 15.0000 mg | ORAL_TABLET | Freq: Four times a day (QID) | ORAL | Status: DC | PRN

## 2013-11-13 NOTE — Telephone Encounter (Signed)
Re-ordered Oxycodone 10 mg, take 15 mg (1.5 tablets)  every 6 hours as needed for severe pain, #90   Reviewed [place] Substance Reporting system prior to reorder

## 2013-11-15 IMAGING — CR DG CHEST 2V
2 series · 2 of 2 positions shown · non-contrast
Comparison: 10/05/2012

CLINICAL DATA: Sickle cell pain crisis. Sickle cell disease. Sore
throat.

EXAM:
CHEST  2 VIEW

[w chest pa]
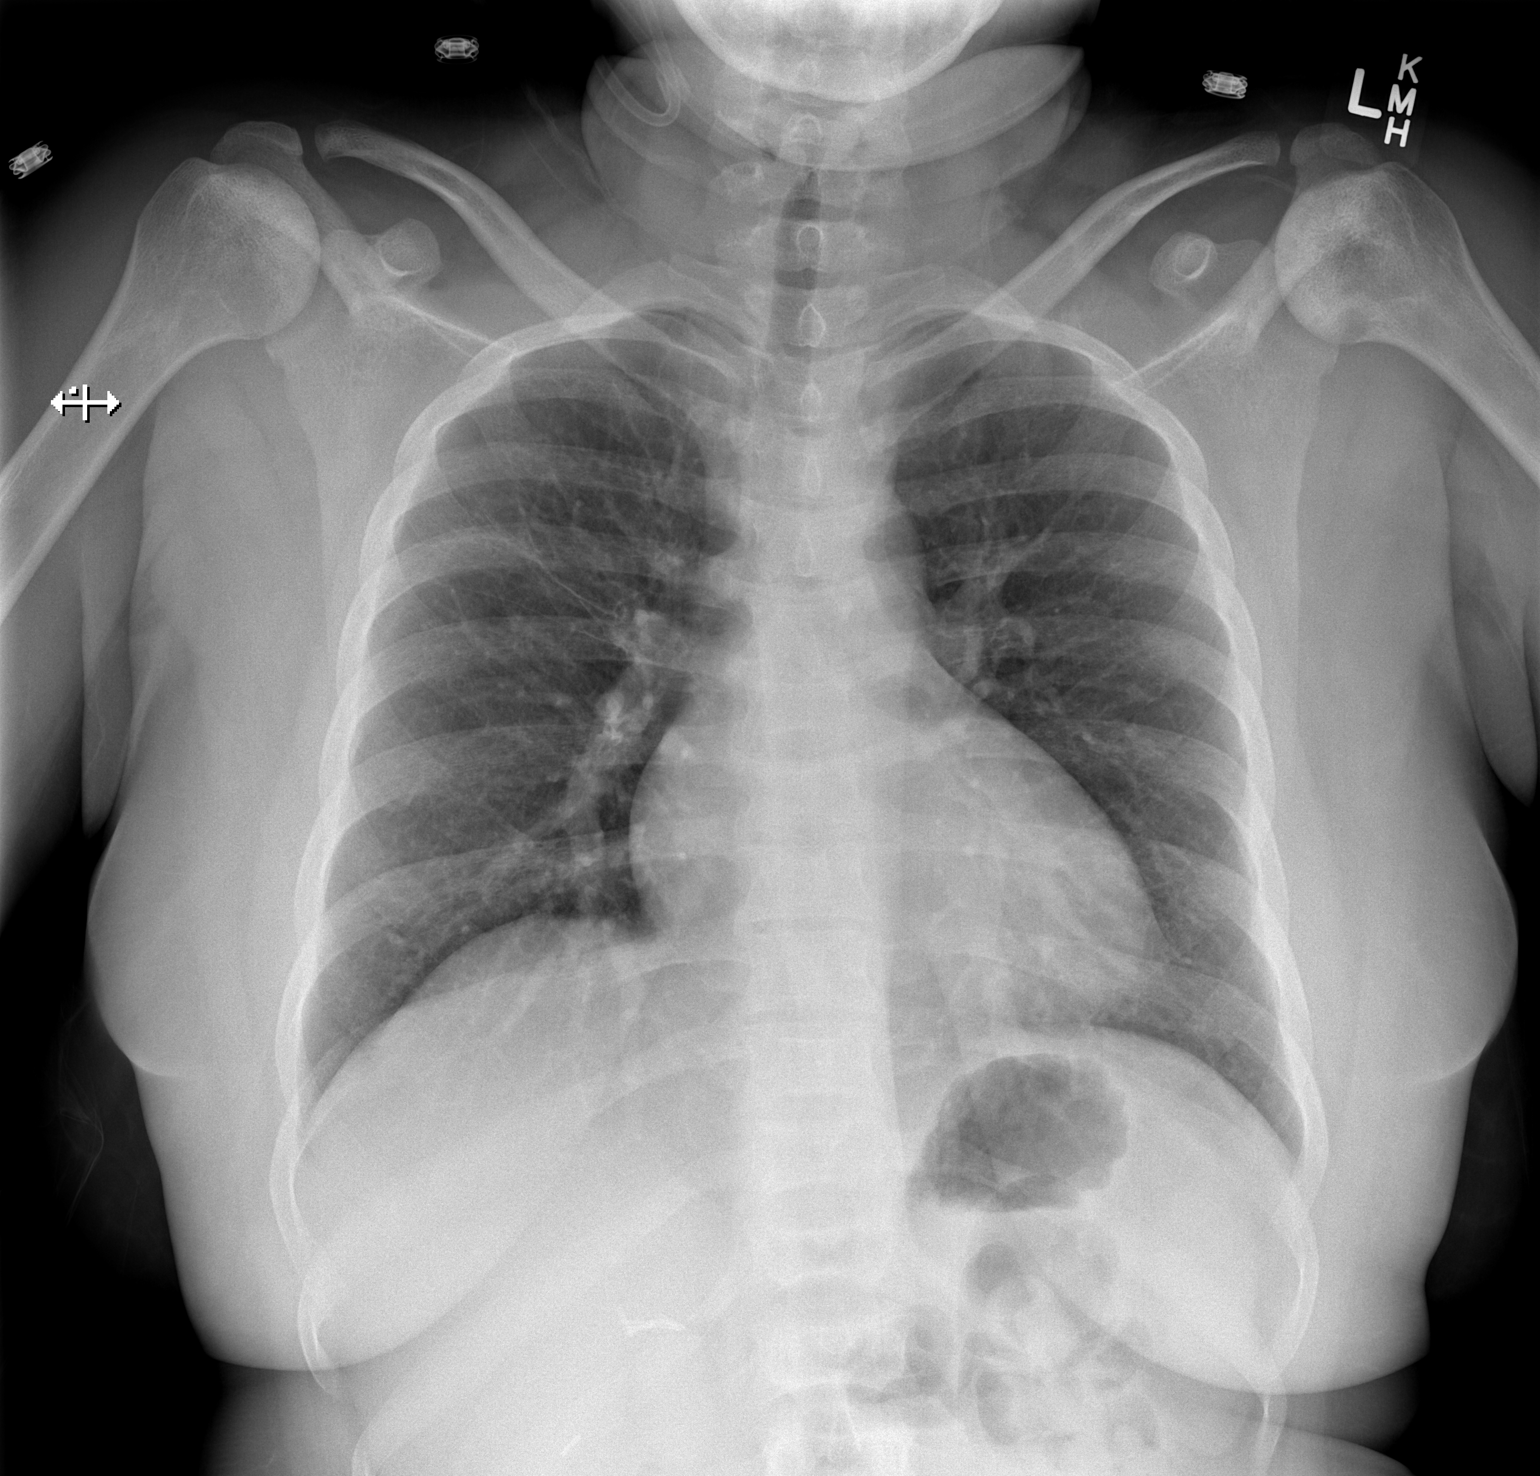

[w chest lat]
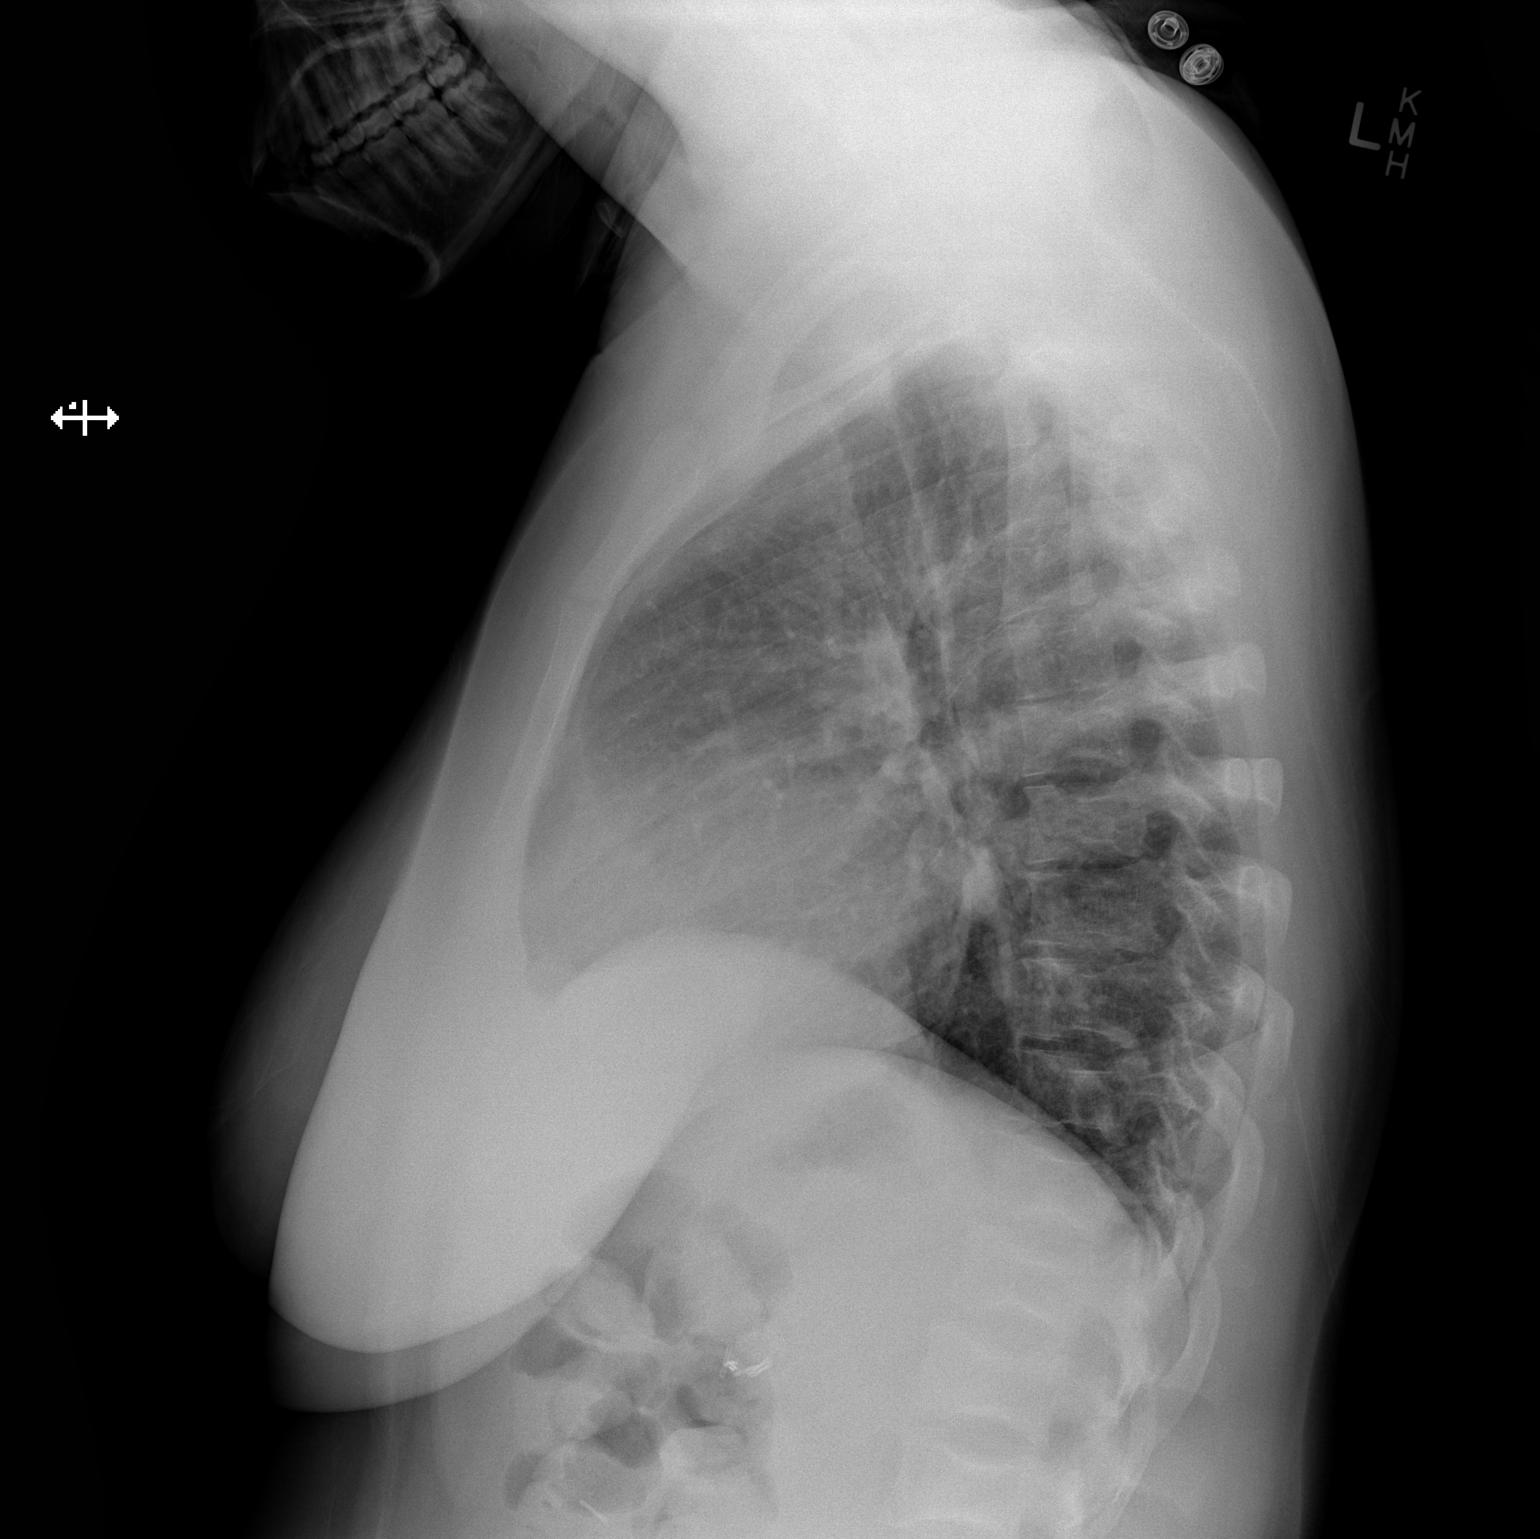

[2 of 2 positions shown; findings below may reference images not displayed]

FINDINGS: The heart size and mediastinal contours are within normal limits.
Both lungs are clear. Chronic skeletal changes of sickle cell
disease are again noted.
IMPRESSION: No active cardiopulmonary disease.

## 2013-11-16 IMAGING — CR DG CHEST 1V PORT
1 series · 1 of 1 positions shown · non-contrast
Comparison: 12/31/2012.

CLINICAL DATA: Sickle cell, chest pain.

PORTABLE CHEST - 1 VIEW

[AP]
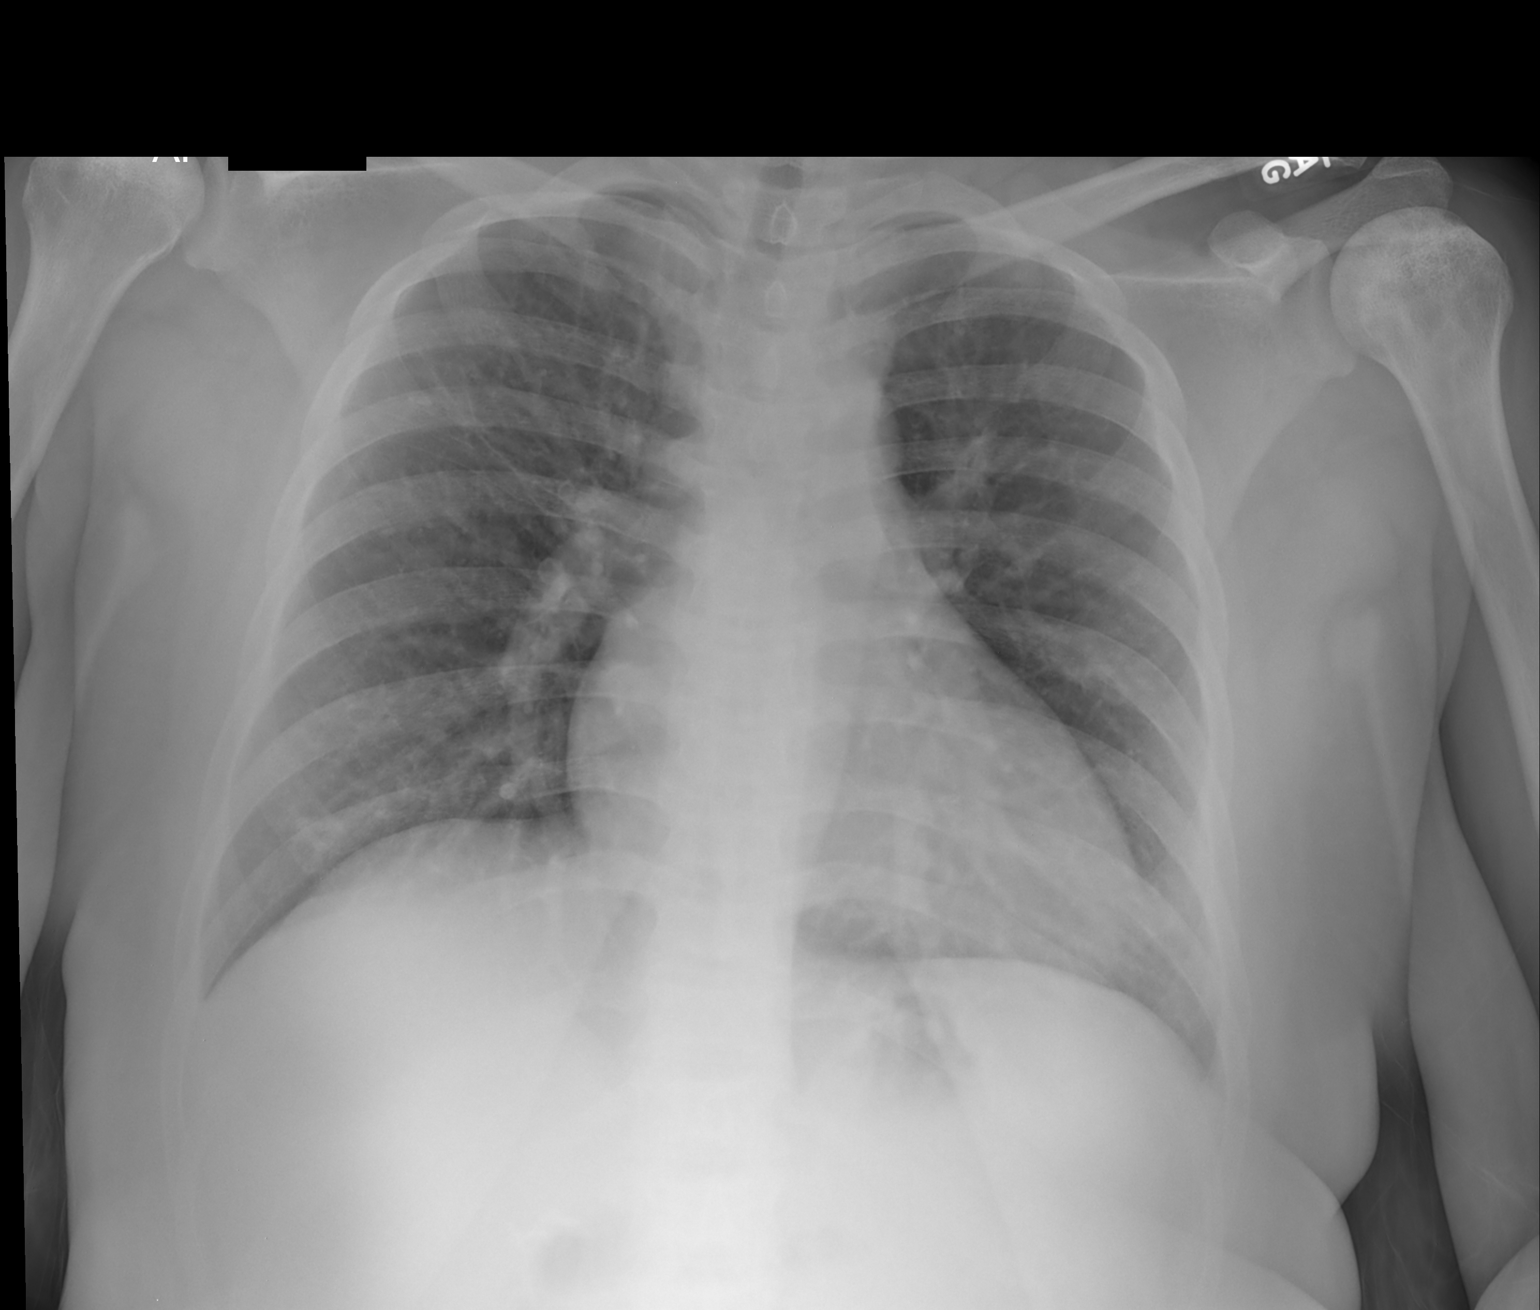

[1 of 1 positions shown; findings below may reference images not displayed]

FINDINGS: Trachea is midline.  Heart size stable.  Mild
accentuation of the pulmonary markings may be technical in
etiology.  No focal airspace consolidation or pleural fluid.
Sickle cell changes are seen in the humeral heads bilaterally.
IMPRESSION: Mild accentuation of pulmonary markings may be technical in
etiology.  Difficult to exclude trace edema.

## 2013-11-19 ENCOUNTER — Other Ambulatory Visit: Payer: Medicare Other

## 2013-11-21 ENCOUNTER — Telehealth: Payer: Self-pay | Admitting: Internal Medicine

## 2013-11-21 DIAGNOSIS — D571 Sickle-cell disease without crisis: Secondary | ICD-10-CM

## 2013-11-22 MED ORDER — METHADONE HCL 5 MG PO TABS: 5.0000 mg | ORAL_TABLET | Freq: Every day | ORAL | Status: DC

## 2013-11-22 NOTE — Telephone Encounter (Signed)
Prescription reordered for Methadone 5 mg #30 tabs which is due to be refilled on 11/27/2013.

## 2013-11-25 ENCOUNTER — Emergency Department (HOSPITAL_COMMUNITY): Payer: Medicare Other

## 2013-11-25 ENCOUNTER — Inpatient Hospital Stay (HOSPITAL_COMMUNITY)
Admission: EM | Admit: 2013-11-25 | Discharge: 2013-11-30 | DRG: 812 | Disposition: A | Discharge status: Home / Self Care | Payer: Medicare Other | Attending: Internal Medicine | Admitting: Internal Medicine

## 2013-11-25 ENCOUNTER — Encounter (HOSPITAL_COMMUNITY): Payer: Self-pay | Admitting: Emergency Medicine

## 2013-11-25 DIAGNOSIS — S79929A Unspecified injury of unspecified thigh, initial encounter: Secondary | ICD-10-CM | POA: Diagnosis not present

## 2013-11-25 DIAGNOSIS — K219 Gastro-esophageal reflux disease without esophagitis: Secondary | ICD-10-CM | POA: Diagnosis present

## 2013-11-25 DIAGNOSIS — D5700 Hb-SS disease with crisis, unspecified: Principal | ICD-10-CM | POA: Diagnosis present

## 2013-11-25 DIAGNOSIS — M545 Low back pain, unspecified: Secondary | ICD-10-CM | POA: Diagnosis not present

## 2013-11-25 DIAGNOSIS — M549 Dorsalgia, unspecified: Secondary | ICD-10-CM | POA: Diagnosis present

## 2013-11-25 DIAGNOSIS — R0789 Other chest pain: Secondary | ICD-10-CM | POA: Diagnosis not present

## 2013-11-25 DIAGNOSIS — M25559 Pain in unspecified hip: Secondary | ICD-10-CM | POA: Diagnosis not present

## 2013-11-25 DIAGNOSIS — D72829 Elevated white blood cell count, unspecified: Secondary | ICD-10-CM

## 2013-11-25 DIAGNOSIS — F39 Unspecified mood [affective] disorder: Secondary | ICD-10-CM | POA: Diagnosis present

## 2013-11-25 DIAGNOSIS — Z833 Family history of diabetes mellitus: Secondary | ICD-10-CM | POA: Diagnosis not present

## 2013-11-25 DIAGNOSIS — E663 Overweight: Secondary | ICD-10-CM | POA: Diagnosis present

## 2013-11-25 DIAGNOSIS — Z87891 Personal history of nicotine dependence: Secondary | ICD-10-CM | POA: Diagnosis not present

## 2013-11-25 DIAGNOSIS — M62838 Other muscle spasm: Secondary | ICD-10-CM | POA: Diagnosis present

## 2013-11-25 DIAGNOSIS — S79919A Unspecified injury of unspecified hip, initial encounter: Secondary | ICD-10-CM | POA: Diagnosis not present

## 2013-11-25 DIAGNOSIS — W19XXXA Unspecified fall, initial encounter: Secondary | ICD-10-CM

## 2013-11-25 DIAGNOSIS — G8929 Other chronic pain: Secondary | ICD-10-CM | POA: Diagnosis present

## 2013-11-25 LAB — COMPREHENSIVE METABOLIC PANEL
ALT: 11 U/L (ref 0–35)
AST: 22 U/L (ref 0–37)
Albumin: 4.4 g/dL (ref 3.5–5.2)
Alkaline Phosphatase: 55 U/L (ref 39–117)
Anion gap: 14 (ref 5–15)
BUN: 6 mg/dL (ref 6–23)
CO2: 23 mEq/L (ref 19–32)
Calcium: 9.5 mg/dL (ref 8.4–10.5)
Chloride: 103 mEq/L (ref 96–112)
Creatinine, Ser: 0.69 mg/dL (ref 0.50–1.10)
GFR calc Af Amer: >90 mL/min (ref >90)
GFR calc non Af Amer: >90 mL/min (ref >90)
Glucose, Bld: 93 mg/dL (ref 70–99)
Potassium: 4 mEq/L (ref 3.7–5.3)
Sodium: 140 mEq/L (ref 137–147)
Total Bilirubin: 2 mg/dL — ABNORMAL HIGH (ref 0.3–1.2)
Total Protein: 7.8 g/dL (ref 6.0–8.3)

## 2013-11-25 LAB — RETICULOCYTES
RBC.: 3.32 MIL/uL — ABNORMAL LOW (ref 3.87–5.11)
Retic Count, Absolute: 351.9 10*3/uL — ABNORMAL HIGH (ref 19.0–186.0)
Retic Ct Pct: 10.6 % — ABNORMAL HIGH (ref 0.4–3.1)

## 2013-11-25 LAB — CBC WITH DIFFERENTIAL/PLATELET
Basophils Absolute: 0.1 10*3/uL (ref 0.0–0.1)
Basophils Relative: 1 % (ref 0–1)
Eosinophils Absolute: 0.3 10*3/uL (ref 0.0–0.7)
Eosinophils Relative: 3 % (ref 0–5)
HCT: 29 % — ABNORMAL LOW (ref 36.0–46.0)
Hemoglobin: 10.3 g/dL — ABNORMAL LOW (ref 12.0–15.0)
Lymphocytes Relative: 19 % (ref 12–46)
Lymphs Abs: 2.1 10*3/uL (ref 0.7–4.0)
MCH: 31 pg (ref 26.0–34.0)
MCHC: 35.5 g/dL (ref 30.0–36.0)
MCV: 87.3 fL (ref 78.0–100.0)
Monocytes Absolute: 1.5 10*3/uL — ABNORMAL HIGH (ref 0.1–1.0)
Monocytes Relative: 13 % — ABNORMAL HIGH (ref 3–12)
Neutro Abs: 7 10*3/uL (ref 1.7–7.7)
Neutrophils Relative %: 64 % (ref 43–77)
Platelets: 551 10*3/uL — ABNORMAL HIGH (ref 150–400)
RBC: 3.32 MIL/uL — ABNORMAL LOW (ref 3.87–5.11)
RDW: 15.5 % (ref 11.5–15.5)
WBC: 10.9 10*3/uL — ABNORMAL HIGH (ref 4.0–10.5)

## 2013-11-25 MED ORDER — HYDROMORPHONE 0.3 MG/ML IV SOLN
INTRAVENOUS | Status: DC
  Administered 2013-11-25: 7 mg via INTRAVENOUS
  Administered 2013-11-25 (×2): via INTRAVENOUS
  Administered 2013-11-26: 3.6 mg via INTRAVENOUS
  Administered 2013-11-26: 1.8 mg via INTRAVENOUS
  Administered 2013-11-26: 2.7 mg via INTRAVENOUS
  Administered 2013-11-26: 2.1 mg via INTRAVENOUS
  Filled 2013-11-25 (×3): qty 25

## 2013-11-25 MED ORDER — FOLIC ACID 1 MG PO TABS
1.0000 mg | ORAL_TABLET | Freq: Every day | ORAL | Status: DC
  Administered 2013-11-25 – 2013-11-30 (×6): 1 mg via ORAL
  Filled 2013-11-25 (×6): qty 1

## 2013-11-25 MED ORDER — DIPHENHYDRAMINE HCL 12.5 MG/5ML PO ELIX: 12.5000 mg | ORAL_SOLUTION | Freq: Four times a day (QID) | ORAL | Status: DC | PRN

## 2013-11-25 MED ORDER — HYDROMORPHONE HCL PF 1 MG/ML IJ SOLN
1.0000 mg | Freq: Once | INTRAMUSCULAR | Status: AC
Start: 2013-11-25 — End: 2013-11-25
  Administered 2013-11-25: 1 mg via INTRAVENOUS
  Filled 2013-11-25: qty 1

## 2013-11-25 MED ORDER — METHADONE HCL 5 MG PO TABS
5.0000 mg | ORAL_TABLET | Freq: Every day | ORAL | Status: DC
  Administered 2013-11-25 – 2013-11-29 (×5): 5 mg via ORAL
  Filled 2013-11-25 (×5): qty 1

## 2013-11-25 MED ORDER — HYDROXYUREA 500 MG PO CAPS
1000.0000 mg | ORAL_CAPSULE | Freq: Every day | ORAL | Status: DC
  Administered 2013-11-25 – 2013-11-26 (×2): 1000 mg via ORAL
  Filled 2013-11-25 (×3): qty 2

## 2013-11-25 MED ORDER — HYDROMORPHONE HCL PF 2 MG/ML IJ SOLN
2.0000 mg | Freq: Once | INTRAMUSCULAR | Status: AC
  Administered 2013-11-25: 2 mg via INTRAVENOUS
  Filled 2013-11-25: qty 1

## 2013-11-25 MED ORDER — ONDANSETRON HCL 4 MG/2ML IJ SOLN
4.0000 mg | Freq: Once | INTRAMUSCULAR | Status: AC
  Administered 2013-11-25: 4 mg via INTRAVENOUS
  Filled 2013-11-25: qty 2

## 2013-11-25 MED ORDER — KETOROLAC TROMETHAMINE 15 MG/ML IJ SOLN
15.0000 mg | Freq: Once | INTRAMUSCULAR | Status: AC
  Administered 2013-11-25: 15 mg via INTRAVENOUS
  Filled 2013-11-25: qty 1

## 2013-11-25 MED ORDER — KETOROLAC TROMETHAMINE 30 MG/ML IJ SOLN
30.0000 mg | Freq: Three times a day (TID) | INTRAMUSCULAR | Status: AC
  Administered 2013-11-25 – 2013-11-27 (×6): 30 mg via INTRAVENOUS
  Filled 2013-11-25: qty 2
  Filled 2013-11-25 (×3): qty 1
  Filled 2013-11-25: qty 2
  Filled 2013-11-25 (×3): qty 1

## 2013-11-25 MED ORDER — ACETAMINOPHEN 650 MG RE SUPP: 650.0000 mg | Freq: Four times a day (QID) | RECTAL | Status: DC | PRN

## 2013-11-25 MED ORDER — SODIUM CHLORIDE 0.9 % IV SOLN
INTRAVENOUS | Status: DC
  Administered 2013-11-25: 10:00:00 via INTRAVENOUS

## 2013-11-25 MED ORDER — NALOXONE HCL 0.4 MG/ML IJ SOLN: 0.4000 mg | INTRAMUSCULAR | Status: DC | PRN

## 2013-11-25 MED ORDER — OXYCODONE HCL 5 MG PO TABS
15.0000 mg | ORAL_TABLET | Freq: Four times a day (QID) | ORAL | Status: DC | PRN
  Administered 2013-11-25: 15 mg via ORAL
  Filled 2013-11-25: qty 3

## 2013-11-25 MED ORDER — SODIUM CHLORIDE 0.9 % IV SOLN
INTRAVENOUS | Status: DC
  Administered 2013-11-25: 15:00:00 via INTRAVENOUS

## 2013-11-25 MED ORDER — DIPHENHYDRAMINE HCL 50 MG/ML IJ SOLN
12.5000 mg | Freq: Four times a day (QID) | INTRAMUSCULAR | Status: DC | PRN
  Filled 2013-11-25: qty 1

## 2013-11-25 MED ORDER — ACETAMINOPHEN 325 MG PO TABS: 650.0000 mg | ORAL_TABLET | Freq: Four times a day (QID) | ORAL | Status: DC | PRN

## 2013-11-25 MED ORDER — SODIUM CHLORIDE 0.9 % IJ SOLN: 9.0000 mL | INTRAMUSCULAR | Status: DC | PRN

## 2013-11-25 MED ORDER — ONDANSETRON HCL 4 MG/2ML IJ SOLN
4.0000 mg | Freq: Four times a day (QID) | INTRAMUSCULAR | Status: DC | PRN
  Filled 2013-11-25: qty 2

## 2013-11-25 MED ORDER — DIPHENHYDRAMINE HCL 50 MG/ML IJ SOLN
12.5000 mg | Freq: Once | INTRAMUSCULAR | Status: AC
  Administered 2013-11-25: 12.5 mg via INTRAVENOUS
  Filled 2013-11-25: qty 1

## 2013-11-25 MED ORDER — HEPARIN SODIUM (PORCINE) 5000 UNIT/ML IJ SOLN
5000.0000 [IU] | Freq: Three times a day (TID) | INTRAMUSCULAR | Status: DC
  Administered 2013-11-25 – 2013-11-28 (×8): 5000 [IU] via SUBCUTANEOUS
  Filled 2013-11-25 (×18): qty 1

## 2013-11-25 NOTE — ED Notes (Signed)
IV team at bedside 

## 2013-11-25 NOTE — ED Notes (Signed)
Per request, Pt given peanut butter and graham crackers.  Informed that a lunch tray has been ordered.

## 2013-11-25 NOTE — Progress Notes (Signed)
UR completed 

## 2013-11-25 NOTE — H&P (Signed)
[facility] History and Physical  [name] JIR:[id number] DOB: 1991-05-15 DOA: 11/25/2013  Referring physician: Wilson Singer PCP: MATTHEWS,MICHELLE A., MD   Chief Complaint: Back pain  HPI: [name] is a 23 y.o. African American female with past medical history of sickle cell, [name] the hospital complaining about back pain. The pain started on Friday as 3/10 in intensity and worked its way up. Patient said she only took pain medication twice in the weekend because she trying to decrease her opioids consumption. She was also using OTC Excedrin without relief, patient mentioned this is her usually her sickle cell crisis comes. She denies any fever, chills denies any shortness of breath or other significant problems. In the ED BMP is okay, CBC showed WBC of 10.9 and hemoglobin of 10.3. Patient requested hydromorphone PCA stating this is the only way her pain but controlled in the hospital.  Review of Systems:  Constitutional: negative for anorexia, fevers and sweats Eyes: negative for irritation, redness and visual disturbance Ears, nose, mouth, throat, and face: negative for earaches, epistaxis, nasal congestion and sore throat Respiratory: negative for cough, dyspnea on exertion, sputum and wheezing Cardiovascular: negative for chest pain, dyspnea, lower extremity edema, orthopnea, palpitations and syncope Gastrointestinal: negative for abdominal pain, constipation, diarrhea, melena, nausea and vomiting Genitourinary:negative for dysuria, frequency and hematuria Hematologic/lymphatic: negative for bleeding, easy bruising and lymphadenopathy Musculoskeletal: Back pain and paraspinous muscle pain Neurological: negative for coordination problems, gait problems, headaches and weakness Endocrine: negative for diabetic symptoms including polydipsia, polyuria and weight loss Allergic/Immunologic: negative for anaphylaxis, hay fever and urticaria  Past Medical History  Diagnosis  Date  . H/O: 1 miscarriage 03/22/2011    Pt reports 2 miscarriages.  [name] 01/08/2009  . Depression 01/06/2011  . GERD (gastroesophageal reflux disease) 02/17/2011  . Trichotillomania     h/o  . Blood transfusion     "lots"  . Sickle cell anemia with crisis   . Exertional dyspnea     "sometimes"  . Sickle cell anemia   . Headache(784.0)   . Migraines 11/08/11    "@ least twice/month"  . Chronic back pain     "very severe; have knot in my back; from tight muscle; take RX and exercise for it"  . Mood swings 11/08/11    "I go back and forth; real bad"  . Pregnancy   . Blood dyscrasia     SICKLE CELL   Past Surgical History  Procedure Laterality Date  . Cholecystectomy  05/2010  . Dilation and curettage of uterus  02/20/11    S/P miscarriage   Social History:   reports that she quit smoking about 8 months ago. Her smoking use included Cigarettes. She has a .25 pack-year smoking history. She has never used smokeless tobacco. She reports that she drinks alcohol. She reports that she does not use illicit drugs.  Allergies  Allergen Reactions  . Carrot Oil Hives and Swelling  . Latex Rash    Family History  Problem Relation Age of Onset  . Diabetes Mother   . Alcoholism    . Depression    . Hypercholesterolemia    . Hypertension    . Migraines    . Diabetes Maternal Grandmother   . Diabetes Paternal Grandmother      Prior to Admission medications   Medication Sig Start Date End Date Taking? Authorizing Provider  folic acid (FOLVITE) 1 MG tablet Take 1 tablet (1 mg total) by mouth daily. 10/28/13  Yes Sharyn Lull A  Zigmund Daniel, MD  hydroxyurea (HYDREA) 500 MG capsule Take 1,000 mg by mouth daily. May take with food to minimize GI side effects.   Yes Historical Provider, MD  methadone (DOLOPHINE) 5 MG tablet Take 1 tablet (5 mg total) by mouth at bedtime. 11/22/13  Yes [name], MD  Oxycodone HCl 10 MG TABS Take 1.5 tablets (15 mg total) by mouth every 6 (six)  hours as needed (For moderate to severe pain). 11/13/13  Yes Dorena Dew, FNP   Physical Exam: Filed Vitals:   11/25/13 0652  BP: 105/55  Pulse:   Temp:   Resp:    Constitutional: Oriented to person, place, and time. Well-developed and well-nourished. Cooperative.  Head: Normocephalic and atraumatic.  Nose: Nose normal.  Mouth/Throat: Uvula is midline, oropharynx is clear and moist and mucous membranes are normal.  Eyes: Conjunctivae and EOM are normal. Pupils are equal, round, and reactive to light.  Neck: Trachea normal and normal range of motion. Neck supple.  Cardiovascular: Normal rate, regular rhythm, S1 normal, S2 normal, normal heart sounds and intact distal pulses.   Pulmonary/Chest: Effort normal and breath sounds normal.  Abdominal: Soft. Bowel sounds are normal. There is no hepatosplenomegaly. There is no tenderness.  Musculoskeletal: Normal range of motion.  Neurological: Alert and oriented to person, place, and time. He has normal strength. No cranial nerve deficit or sensory deficit.  Skin: Skin is warm, dry and intact.  Psychiatric: Has a normal mood and affect. Speech is normal and behavior is normal.   Labs on Admission:  Basic Metabolic Panel:  Recent Labs Lab 11/25/13 0930  NA 140  K 4.0  CL 103  CO2 23  GLUCOSE 93  BUN 6  CREATININE 0.69  CALCIUM 9.5   Liver Function Tests:  Recent Labs Lab 11/25/13 0930  AST 22  ALT 11  ALKPHOS 55  BILITOT 2.0*  PROT 7.8  ALBUMIN 4.4   No results found for this basename: LIPASE, AMYLASE,  in the last 168 hours No results found for this basename: AMMONIA,  in the last 168 hours CBC:  Recent Labs Lab 11/25/13 0930  WBC 10.9*  NEUTROABS 7.0  HGB 10.3*  HCT 29.0*  MCV 87.3  PLT 551*   Cardiac Enzymes: No results found for this basename: CKTOTAL, CKMB, CKMBINDEX, TROPONINI,  in the last 168 hours  BNP (last 3 results) No results found for this basename: PROBNP,  in the last 8760 hours CBG: No  results found for this basename: GLUCAP,  in the last 168 hours  Radiological Exams on Admission: Dg Chest 2 View  11/25/2013   CLINICAL DATA:  Posterior left chest discomfort without shortness of breath or cough; history of sickle cell disease  EXAM: CHEST  2 VIEW  COMPARISON:  Portable chest x-ray of Sep 23, 2013  FINDINGS: The lungs are well-expanded and clear. The heart and mediastinal structures are normal. There is no pleural effusion. The bony thorax is unremarkable with exception of chronic vertebral body changes consistent with sickle cell disease.  IMPRESSION: No active cardiopulmonary disease.   Electronically Signed   By: David  Martinique   On: 11/25/2013 08:04    EKG: Independently reviewed.   Assessment/Plan Principal Problem:   Sickle cell pain crisis Active Problems:   Back pain   Overweight(278.02)   Sickle cell painful crisis -Per patient this is typical pain for her painful crises presented. -Started on Dilaudid PCA, also her long-acting medications continued. -Continue folic acid and hydroxyurea. -Started on  scheduled Toradol 30 mg every 8 hours for 2 days -Aggressive IV fluid hydration with normal saline at 125 mL/hr.  Back pain -Secondary to above, likely secondary to her sickle cell painful crisis, check UA. -No further workup for now, if pain continues while her crisis controlled can do further imaging.  Overweight -Counseled about weight loss.  Code Status: Full code Family Communication: Plan discussed with the patient Disposition Plan: Inpatient, MedSurg  Time spent: 80 minutes  [facility] Pager [phone]

## 2013-11-25 NOTE — ED Notes (Addendum)
Pt asked to speak w/ this Agricultural consultant.  Pt states that she would like to be admitted to a floor.  This RN informed her that nursing staff does not have a say regarding admission.  Then, the Pt asked about more pain medication.  Pt informed that the MD had just put in another order and her primary RN was getting it.  Pt seemed to understand situation.

## 2013-11-25 NOTE — ED Notes (Signed)
Pt c/o sickle cell crisis all weekend; couldn't take pain any longer; bilateral rib towards back pain; no shortness of breath; took excedrin and oxycodone yesterday without relief; no meds this am

## 2013-11-25 NOTE — ED Provider Notes (Signed)
CSN: [id number]     Arrival date & time 11/25/13  0938 History   First MD Initiated Contact with Patient 11/25/13 0715     Chief Complaint  Patient presents with  . Sickle Cell Pain Crisis     (Consider location/radiation/quality/duration/timing/severity/associated sxs/prior Treatment) Patient is a 23 y.o. female presenting with sickle cell pain.  Sickle Cell Pain Crisis   23 year old female with back pain. Pain is in the mid back "in the ribs." Denies any trauma. She has a past history of sickle cell anemia. She states that she feels like her current symptoms are from pain crisis. Worsening since this past weekend. Pain is constant. Worse with movement. Denies any significant pain anywhere else. No respiratory complaints. No urinary complaints. Patient has been taking oxycodone at home with minimal relief. No fevers or chills.  Past Medical History  Diagnosis Date  . H/O: 1 miscarriage 03/22/2011    Pt reports 2 miscarriages.  [name] 01/08/2009  . Depression 01/06/2011  . GERD (gastroesophageal reflux disease) 02/17/2011  . Trichotillomania     h/o  . Blood transfusion     "lots"  . Sickle cell anemia with crisis   . Exertional dyspnea     "sometimes"  . Sickle cell anemia   . Headache(784.0)   . Migraines 11/08/11    "@ least twice/month"  . Chronic back pain     "very severe; have knot in my back; from tight muscle; take RX and exercise for it"  . Mood swings 11/08/11    "I go back and forth; real bad"  . Pregnancy   . Blood dyscrasia     SICKLE CELL   Past Surgical History  Procedure Laterality Date  . Cholecystectomy  05/2010  . Dilation and curettage of uterus  02/20/11    S/P miscarriage   Family History  Problem Relation Age of Onset  . Diabetes Mother   . Alcoholism    . Depression    . Hypercholesterolemia    . Hypertension    . Migraines    . Diabetes Maternal Grandmother   . Diabetes Paternal Grandmother    History  Substance Use Topics  .  Smoking status: Former Smoker -- 0.25 packs/day for 1 years    Types: Cigarettes    Quit date: 03/25/2013  . Smokeless tobacco: Never Used  . Alcohol Use: Yes     Comment: pt states she quit marijuan in May 2013. Rare ETOH, + cigarettes.  She is enrolled in school   OB History   Grav Para Term Preterm Abortions TAB SAB Ect Mult Living   2    1  1         Obstetric Comments   Miscarried in October 2012 at about 7 weeks     Review of Systems  All systems reviewed and negative, other than as noted in HPI.   Allergies  Carrot oil and Latex  Home Medications   Prior to Admission medications   Medication Sig Start Date End Date Taking? Authorizing Provider  folic acid (FOLVITE) 1 MG tablet Take 1 tablet (1 mg total) by mouth daily. 10/28/13  Yes [name], MD  hydroxyurea (HYDREA) 500 MG capsule Take 1,000 mg by mouth daily. May take with food to minimize GI side effects.   Yes Historical Provider, MD  methadone (DOLOPHINE) 5 MG tablet Take 1 tablet (5 mg total) by mouth at bedtime. 11/22/13  Yes [name], MD  Oxycodone HCl 10 MG TABS  Take 1.5 tablets (15 mg total) by mouth every 6 (six) hours as needed (For moderate to severe pain). 11/13/13  Yes Dorena Dew, FNP   BP 105/55  Pulse 78  Temp(Src) 98.9 F (37.2 C)  Resp 20  SpO2 100%  LMP 11/04/2013 Physical Exam  Nursing note and vitals reviewed. Constitutional: She appears well-developed and well-nourished. No distress.  Laying in bed on side. Appears uncomfortable, but not toxic.  HENT:  Head: Normocephalic and atraumatic.  Eyes: Conjunctivae are normal. Right eye exhibits no discharge. Left eye exhibits no discharge.  Neck: Neck supple.  Cardiovascular: Normal rate, regular rhythm and normal heart sounds.  Exam reveals no gallop and no friction rub.   No murmur heard. Pulmonary/Chest: Effort normal and breath sounds normal. No respiratory distress.  Abdominal: Soft. She exhibits no distension. There  is no tenderness.  Musculoskeletal: She exhibits no edema and no tenderness.       Arms: Lower extremities symmetric as compared to each other. No calf tenderness. Negative Homan's. No palpable cords.   Neurological: She is alert.  Skin: Skin is warm and dry.  Psychiatric: She has a normal mood and affect. Her behavior is normal. Thought content normal.    ED Course  Procedures (including critical care time) Labs Review Labs Reviewed  CBC WITH DIFFERENTIAL - Abnormal; Notable for the following:    WBC 10.9 (*)    RBC 3.32 (*)    Hemoglobin 10.3 (*)    HCT 29.0 (*)    Platelets 551 (*)    Monocytes Relative 13 (*)    Monocytes Absolute 1.5 (*)    All other components within normal limits  RETICULOCYTES - Abnormal; Notable for the following:    Retic Ct Pct 10.6 (*)    RBC. 3.32 (*)    Retic Count, Manual 351.9 (*)    All other components within normal limits  COMPREHENSIVE METABOLIC PANEL - Abnormal; Notable for the following:    Total Bilirubin 2.0 (*)    All other components within normal limits  URINALYSIS, ROUTINE W REFLEX MICROSCOPIC    Imaging Review Dg Chest 2 View  11/25/2013   CLINICAL DATA:  Posterior left chest discomfort without shortness of breath or cough; history of sickle cell disease  EXAM: CHEST  2 VIEW  COMPARISON:  Portable chest x-ray of Sep 23, 2013  FINDINGS: The lungs are well-expanded and clear. The heart and mediastinal structures are normal. There is no pleural effusion. The bony thorax is unremarkable with exception of chronic vertebral body changes consistent with sickle cell disease.  IMPRESSION: No active cardiopulmonary disease.   Electronically Signed   By: David  Martinique   On: 11/25/2013 08:04     EKG Interpretation None      MDM   Final diagnoses:  Sickle cell pain crisis   [name] with back pain. She attributes to sickle cell pain crisis. May be. No trauma. No respiratory complaints. No increased WOB. Doubt acute chest, PE, or other  emergent complication/condition. H/H stable from 1 month ago. Retic appropriately elevated. Will admit for further pain management.     Virgel Manifold, MD 11/28/13 [phone]

## 2013-11-26 DIAGNOSIS — D5700 Hb-SS disease with crisis, unspecified: Principal | ICD-10-CM

## 2013-11-26 LAB — CBC WITH DIFFERENTIAL/PLATELET
BASOS ABS: 0.1 10*3/uL (ref 0.0–0.1)
Basophils Relative: 1 % (ref 0–1)
Eosinophils Absolute: 0.6 10*3/uL (ref 0.0–0.7)
Eosinophils Relative: 5 % (ref 0–5)
HEMATOCRIT: 25 % — AB (ref 36.0–46.0)
Hemoglobin: 8.7 g/dL — ABNORMAL LOW (ref 12.0–15.0)
LYMPHS PCT: 30 % (ref 12–46)
Lymphs Abs: 3.6 10*3/uL (ref 0.7–4.0)
MCH: 30.9 pg (ref 26.0–34.0)
MCHC: 34.8 g/dL (ref 30.0–36.0)
MCV: 88.7 fL (ref 78.0–100.0)
Monocytes Absolute: 1.8 10*3/uL — ABNORMAL HIGH (ref 0.1–1.0)
Monocytes Relative: 15 % — ABNORMAL HIGH (ref 3–12)
NEUTROS ABS: 6.1 10*3/uL (ref 1.7–7.7)
NEUTROS PCT: 49 % (ref 43–77)
Platelets: 459 10*3/uL — ABNORMAL HIGH (ref 150–400)
RBC: 2.82 MIL/uL — ABNORMAL LOW (ref 3.87–5.11)
RDW: 16.2 % — AB (ref 11.5–15.5)
WBC: 12.1 10*3/uL — AB (ref 4.0–10.5)

## 2013-11-26 LAB — COMPREHENSIVE METABOLIC PANEL
ALK PHOS: 63 U/L (ref 39–117)
ALT: 9 U/L (ref 0–35)
ANION GAP: 10 (ref 5–15)
AST: 20 U/L (ref 0–37)
Albumin: 3.5 g/dL (ref 3.5–5.2)
BUN: 7 mg/dL (ref 6–23)
CO2: 24 mEq/L (ref 19–32)
Calcium: 8.4 mg/dL (ref 8.4–10.5)
Chloride: 106 mEq/L (ref 96–112)
Creatinine, Ser: 0.68 mg/dL (ref 0.50–1.10)
GFR calc Af Amer: >90 mL/min (ref >90)
GFR calc non Af Amer: >90 mL/min (ref >90)
Glucose, Bld: 94 mg/dL (ref 70–99)
POTASSIUM: 4.2 meq/L (ref 3.7–5.3)
SODIUM: 140 meq/L (ref 137–147)
TOTAL PROTEIN: 6.5 g/dL (ref 6.0–8.3)
Total Bilirubin: 1.8 mg/dL — ABNORMAL HIGH (ref 0.3–1.2)

## 2013-11-26 LAB — LACTATE DEHYDROGENASE: LDH: 267 U/L — ABNORMAL HIGH (ref 94–250)

## 2013-11-26 MED ORDER — DEXTROSE-NACL 5-0.45 % IV SOLN
INTRAVENOUS | Status: DC
  Administered 2013-11-26 – 2013-11-29 (×4): via INTRAVENOUS

## 2013-11-26 MED ORDER — SODIUM CHLORIDE 0.9 % IJ SOLN: 9.0000 mL | INTRAMUSCULAR | Status: DC | PRN

## 2013-11-26 MED ORDER — HYDROMORPHONE HCL PF 2 MG/ML IJ SOLN
2.0000 mg | Freq: Once | INTRAMUSCULAR | Status: AC
  Administered 2013-11-26: 2 mg via INTRAVENOUS
  Filled 2013-11-26: qty 1

## 2013-11-26 MED ORDER — HYDROMORPHONE 0.3 MG/ML IV SOLN
INTRAVENOUS | Status: DC
  Administered 2013-11-26: 4.6 mg via INTRAVENOUS
  Administered 2013-11-26: 3.8 mg via INTRAVENOUS
  Administered 2013-11-26: 2.68 mg via INTRAVENOUS
  Administered 2013-11-26 (×2): 8.19 mg via INTRAVENOUS
  Administered 2013-11-27: 06:00:00 via INTRAVENOUS
  Administered 2013-11-27: 4.1 mg via INTRAVENOUS
  Administered 2013-11-27: 13:00:00 via INTRAVENOUS
  Administered 2013-11-27: 4.19 mg via INTRAVENOUS
  Administered 2013-11-27: 1.22 mg via INTRAVENOUS
  Administered 2013-11-27: 4.7 mg via INTRAVENOUS
  Administered 2013-11-27: 21:00:00 via INTRAVENOUS
  Administered 2013-11-27: 6.29 mg via INTRAVENOUS
  Administered 2013-11-28: 3.87 mg via INTRAVENOUS
  Administered 2013-11-28: 4.89 mg via INTRAVENOUS
  Administered 2013-11-28: 02:00:00 via INTRAVENOUS
  Administered 2013-11-28: 2.8 mg via INTRAVENOUS
  Administered 2013-11-28: 19:00:00 via INTRAVENOUS
  Administered 2013-11-29: 1.4 mg via INTRAVENOUS
  Administered 2013-11-29: 18:00:00 via INTRAVENOUS
  Administered 2013-11-29: 5.53 mg via INTRAVENOUS
  Administered 2013-11-29: 11.99 mg via INTRAVENOUS
  Administered 2013-11-29: 3.36 mg via INTRAVENOUS
  Administered 2013-11-29: 2.46 mg via INTRAVENOUS
  Administered 2013-11-29: 14:00:00 via INTRAVENOUS
  Administered 2013-11-30: 3.47 mg via INTRAVENOUS
  Administered 2013-11-30: 2.56 mg via INTRAVENOUS
  Administered 2013-11-30: 4.83 mg via INTRAVENOUS
  Administered 2013-11-30: 3.44 mg via INTRAVENOUS
  Administered 2013-11-30: 03:00:00 via INTRAVENOUS
  Administered 2013-11-30: 4.89 mg via INTRAVENOUS
  Filled 2013-11-26 (×14): qty 25

## 2013-11-26 MED ORDER — ONDANSETRON HCL 4 MG/2ML IJ SOLN
4.0000 mg | Freq: Four times a day (QID) | INTRAMUSCULAR | Status: DC | PRN
  Administered 2013-11-29: 4 mg via INTRAVENOUS
  Filled 2013-11-26: qty 2

## 2013-11-26 MED ORDER — SENNOSIDES-DOCUSATE SODIUM 8.6-50 MG PO TABS
1.0000 | ORAL_TABLET | Freq: Two times a day (BID) | ORAL | Status: DC
  Administered 2013-11-26 – 2013-11-29 (×4): 1 via ORAL
  Filled 2013-11-26 (×9): qty 1

## 2013-11-26 MED ORDER — HYDROXYUREA 500 MG PO CAPS
1500.0000 mg | ORAL_CAPSULE | Freq: Every day | ORAL | Status: DC
  Administered 2013-11-27 – 2013-11-30 (×4): 1500 mg via ORAL
  Filled 2013-11-26 (×6): qty 3

## 2013-11-26 MED ORDER — POLYETHYLENE GLYCOL 3350 17 G PO PACK
17.0000 g | PACK | Freq: Every day | ORAL | Status: DC | PRN
  Filled 2013-11-26: qty 1

## 2013-11-26 MED ORDER — NALOXONE HCL 0.4 MG/ML IJ SOLN: 0.4000 mg | INTRAMUSCULAR | Status: DC | PRN

## 2013-11-26 MED ORDER — DIPHENHYDRAMINE HCL 25 MG PO CAPS
25.0000 mg | ORAL_CAPSULE | Freq: Four times a day (QID) | ORAL | Status: DC | PRN
  Administered 2013-11-26: 50 mg via ORAL
  Filled 2013-11-26: qty 2

## 2013-11-26 NOTE — Progress Notes (Signed)
Nutrition Brief Note  Patient identified on the Malnutrition Screening Tool (MST) Report  Wt Readings from Last 15 Encounters:  11/25/13 176 lb 12.9 oz (80.2 kg)  10/28/13 161 lb (73.029 kg)  09/24/13 165 lb (74.844 kg)  08/27/13 176 lb (79.833 kg)  08/15/13 169 lb (76.658 kg)  07/31/13 176 lb 3.2 oz (79.924 kg)  07/19/13 178 lb (80.74 kg)  06/28/13 174 lb 9.6 oz (79.198 kg)  06/19/13 182 lb (82.555 kg)  05/28/13 183 lb 10.3 oz (83.3 kg)  05/15/13 181 lb (82.101 kg)  05/01/13 183 lb (83.008 kg)  04/24/13 183 lb 13.8 oz (83.4 kg)  04/10/13 180 lb (81.647 kg)  03/23/13 178 lb 9.2 oz (81 kg)    Body mass index is 31.33 kg/(m^2). Patient meets criteria for Overweight based on current BMI.   Current diet order is Regular, patient is consuming approximately 100% of meals at this time. Labs and medications reviewed.   Patient reported an intentional wt loss of 30 lbs over past 3 months d/t diet modifications and removing stressors from lifestyle. Previous medical records confirm weight loss; however it appears to have been closer to 5-10 lbs over a more gradual period of 6 months. Pt w/out further questions regarding healthy diet or weight management recommendations.  No nutrition interventions warranted at this time. If nutrition issues arise, please consult RD.   Atlee Abide MS RD LDN Clinical Dietitian [phone]

## 2013-11-26 NOTE — Progress Notes (Signed)
SICKLE CELL SERVICE PROGRESS NOTE  [name] SJG:[id number] DOB: 27-Nov-1990 DOA: 11/25/2013 PCP: MATTHEWS,MICHELLE A., MD  Assessment/Plan: Principal Problem:   Sickle cell pain crisis Active Problems:   Back pain   Overweight(278.02)  1. Hb SS with crisis: Pt is an opiate tolerant patient who is currently on an sub therapeutic dose of analgesic. Will increase PCA dose to 0.7 mg per bolus. Continue Toradol and change IVF to D5.45. Re-evaluate analgesia effect and adjust pain regimen as indicated. 2. SCD: Pt reports compliance with Hydrea. Still not on MTD. Will increase to 1500 mg daily which will place patient at a dose of 18 mg/kg.  3. Psychosocial: Pt has been doing well with her SCD. He quality of life has improved and she is working consistently at her job at [employer]: Full Code Family Communication: N/A Disposition Plan:  Not yet ready for discharge  [name].  Pager [phone]. If 7PM-7AM, please contact night-coverage.  11/26/2013, 1:18 PM  LOS: 1 day   Brief narrative: [name] is a 23 y.o. African American female with past medical history of sickle cell, [name] the hospital complaining about back pain. The pain started on Friday as 3/10 in intensity and worked its way up. Patient said she only took pain medication twice in the weekend because she trying to decrease her opioids consumption. She was also using OTC Excedrin without relief, patient mentioned this is her usually her sickle cell crisis comes. She denies any fever, chills denies any shortness of breath or other significant problems.  In the ED BMP is okay, CBC showed WBC of 10.9 and hemoglobin of 10.3. Patient requested hydromorphone PCA stating this is the only way her pain but controlled in the hospital.   Consultants:  None  Procedures:  None  Antibiotics:  None  HPI/Subjective: Pt states that he pain is only minimally affected by the current dose of PCA bolus. The  pain is localized to legs and back. Last BM yesterday.  Objective: Filed Vitals:   11/26/13 0633 11/26/13 0820 11/26/13 1039 11/26/13 1250  BP:   105/65   Pulse:   92   Temp:   98.7 F (37.1 C)   TempSrc:   Oral   Resp: 18 13 13 16   Height:      Weight:      SpO2: 99%  100% 98%   Weight change:   Intake/Output Summary (Last 24 hours) at 11/26/13 1318 Last data filed at 11/26/13 1041  Gross per 24 hour  Intake    480 ml  Output   3000 ml  Net  -2520 ml    General: Alert, awake, oriented x3, in no acute distress.  HEENT: [place]/AT PEERL, EOMI, anicteric Neck: Trachea midline,  no masses, no thyromegal,y no JVD, no carotid bruit OROPHARYNX:  Moist, No exudate/ erythema/lesions.  Heart: Regular rate and rhythm, without murmurs, rubs, gallops, PMI non-displaced, no heaves or thrills on palpation.  Lungs: Clear to auscultation, no wheezing or rhonchi noted. No increased vocal fremitus resonant to percussion  Abdomen: Soft, nontender, nondistended, positive bowel sounds, no masses no hepatosplenomegaly noted..  Neuro: No focal neurological deficits noted cranial nerves II through XII grossly intact. DTRs 2+ bilaterally upper and lower extremities. Strength 5 out of 5 in bilateral upper and lower extremities. Musculoskeletal: No warm swelling or erythema around joints, no spinal tenderness noted. Psychiatric: Patient alert and oriented x3, good insight and cognition, good recent to remote recall. Lymph node survey: No cervical  axillary or inguinal lymphadenopathy noted.   Data Reviewed: Basic Metabolic Panel:  Recent Labs Lab 11/25/13 0930 11/26/13 0912  NA 140 140  K 4.0 4.2  CL 103 106  CO2 23 24  GLUCOSE 93 94  BUN 6 7  CREATININE 0.69 0.68  CALCIUM 9.5 8.4   Liver Function Tests:  Recent Labs Lab 11/25/13 0930 11/26/13 0912  AST 22 20  ALT 11 9  ALKPHOS 55 63  BILITOT 2.0* 1.8*  PROT 7.8 6.5  ALBUMIN 4.4 3.5   No results found for this basename: LIPASE,  AMYLASE,  in the last 168 hours No results found for this basename: AMMONIA,  in the last 168 hours CBC:  Recent Labs Lab 11/25/13 0930 11/26/13 0912  WBC 10.9* 12.1*  NEUTROABS 7.0 6.1  HGB 10.3* 8.7*  HCT 29.0* 25.0*  MCV 87.3 88.7  PLT 551* 459*   Cardiac Enzymes: No results found for this basename: CKTOTAL, CKMB, CKMBINDEX, TROPONINI,  in the last 168 hours BNP (last 3 results) No results found for this basename: PROBNP,  in the last 8760 hours CBG: No results found for this basename: GLUCAP,  in the last 168 hours  No results found for this or any previous visit (from the past 240 hour(s)).   Studies: Dg Chest 2 View  11/25/2013   CLINICAL DATA:  Posterior left chest discomfort without shortness of breath or cough; history of sickle cell disease  EXAM: CHEST  2 VIEW  COMPARISON:  Portable chest x-ray of Sep 23, 2013  FINDINGS: The lungs are well-expanded and clear. The heart and mediastinal structures are normal. There is no pleural effusion. The bony thorax is unremarkable with exception of chronic vertebral body changes consistent with sickle cell disease.  IMPRESSION: No active cardiopulmonary disease.   Electronically Signed   By: David  Martinique   On: 11/25/2013 08:04    Scheduled Meds: . folic acid  1 mg Oral Daily  . heparin  5,000 Units Subcutaneous 3 times per day  . HYDROmorphone PCA 0.3 mg/mL   Intravenous 6 times per day  . [START ON 11/27/2013] hydroxyurea  1,500 mg Oral Q breakfast  . ketorolac  30 mg Intravenous 3 times per day  . methadone  5 mg Oral QHS  . senna-docusate  1 tablet Oral BID   Continuous Infusions: . dextrose 5 % and 0.45% NaCl 75 mL/hr at 11/26/13 0950    Total time 35 minutes.

## 2013-11-26 NOTE — Progress Notes (Signed)
Pt was sitting up in bed when I arrived. 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  Pt said she wanted advice on getting closer to God. She described her history w/a former boyfriend, her journey of abusing drugs and her relationship w/family. Pt said she has changed a lot in the past year and she wants to be better. She said she wanted to be saved. She explained how she went to church w/former BF but only b/c he was there to play drums. She said she wanted more. Prayed prayer of salvation w/pt. She also said she didn't know how to pray but wanted to. We talked about that and A.C.T.S. of, praying. Pt would like additional oppty to talk w/someone after discharge. Will give her a Pastoral Care referral if available. Ernest Haber Chaplain  11/26/13 1200  Clinical Encounter Type  Visited With Patient

## 2013-11-26 NOTE — Progress Notes (Signed)
Came to visit patient at bedside to discuss [facility] services. She pleasantly declines. Will make inpatient RNCM aware of visit.  Marthenia Rolling, MSN- RN, [facility] [phone]

## 2013-11-27 ENCOUNTER — Ambulatory Visit: Payer: Medicare Other | Admitting: Family Medicine

## 2013-11-27 MED ORDER — HYDROMORPHONE HCL PF 1 MG/ML IJ SOLN
1.0000 mg | Freq: Once | INTRAMUSCULAR | Status: AC
Start: 2013-11-27 — End: 2013-11-27
  Administered 2013-11-27: 1 mg via INTRAVENOUS
  Filled 2013-11-27: qty 1

## 2013-11-27 MED ORDER — METHOCARBAMOL 500 MG PO TABS
1000.0000 mg | ORAL_TABLET | Freq: Four times a day (QID) | ORAL | Status: DC
  Administered 2013-11-27 – 2013-11-29 (×3): 1000 mg via ORAL
  Administered 2013-11-30 (×2): 500 mg via ORAL
  Filled 2013-11-27 (×15): qty 2

## 2013-11-27 NOTE — Progress Notes (Signed)
SICKLE CELL SERVICE PROGRESS NOTE  [name] UXN:[id number] DOB: Jun 05, 1990 DOA: 11/25/2013 PCP: Ralonda Tartt A., MD  Assessment/Plan: Principal Problem:   Sickle cell pain crisis Active Problems:   Back pain   Overweight(278.02)  1. Hb SS with crisis:   Pt has used 25.2 mg with 42 demands and 37 deliveries. Will continue PCA at current dose.Also continue Toradol and add Robaxin for muscle spasms.Decrease  IVF to [facility]. Re-evaluate analgesia effect and consider starting scheduled oral medications this evening in anticipation of discharge tomorrow or the next day. 2. SCD: Pt reports compliance with Hydrea. Still not on MTD. Will increase to 1500 mg daily which will place patient at a dose of 18 mg/kg.  3. Psychosocial: Pt has been doing well with her SCD. Her quality of life has improved and she is working consistently at her job at [employer]: Full Code Family Communication: N/A Disposition Plan:  Not yet ready for discharge  [place].  Pager [phone]. If 7PM-7AM, please contact night-coverage.  11/27/2013, 11:10 AM  LOS: 2 days   Brief narrative: [name] is a 23 y.o. African American female with past medical history of sickle cell, [name] the hospital complaining about back pain. The pain started on Friday as 3/10 in intensity and worked its way up. Patient said she only took pain medication twice in the weekend because she trying to decrease her opioids consumption. She was also using OTC Excedrin without relief, patient mentioned this is her usually her sickle cell crisis comes. She denies any fever, chills denies any shortness of breath or other significant problems.  In the ED BMP is okay, CBC showed WBC of 10.9 and hemoglobin of 10.3. Patient requested hydromorphone PCA stating this is the only way her pain but controlled in the hospital.   Consultants:  None  Procedures:  None  Antibiotics:  None  HPI/Subjective: Pt states that  he pain is only minimally affected by the current dose of PCA bolus. The pain is localized to legs and back. Last BM yesterday.  Objective: Filed Vitals:   11/27/13 0527 11/27/13 0555 11/27/13 0743 11/27/13 0941  BP: 105/62   120/43  Pulse: 91   88  Temp: 98.7 F (37.1 C)   98.5 F (36.9 C)  TempSrc: Oral   Oral  Resp: 15 13 12 9   Height:      Weight:      SpO2: 97%  98% 100%   Weight change:   Intake/Output Summary (Last 24 hours) at 11/27/13 1110 Last data filed at 11/27/13 0942  Gross per 24 hour  Intake 1992.5 ml  Output   3150 ml  Net -1157.5 ml    General: Alert, awake, oriented x3, in no acute distress.  HEENT: [place]/AT PEERL, EOMI, anicteric Neck: Trachea midline,  no masses, no thyromegal,y no JVD, no carotid bruit OROPHARYNX:  Moist, No exudate/ erythema/lesions.  Heart: Regular rate and rhythm, without murmurs, rubs, gallops, PMI non-displaced, no heaves or thrills on palpation.  Lungs: Clear to auscultation, no wheezing or rhonchi noted. No increased vocal fremitus resonant to percussion  Abdomen: Soft, nontender, nondistended, positive bowel sounds, no masses no hepatosplenomegaly noted..  Neuro: No focal neurological deficits noted cranial nerves II through XII grossly intact. DTRs 2+ bilaterally upper and lower extremities. Strength normal in bilateral upper and lower extremities. Musculoskeletal: No warm swelling or erythema around joints, no spinal tenderness noted. Psychiatric: Patient alert and oriented x3, good insight and cognition, good recent to remote  recall. Lymph node survey: No cervical axillary or inguinal lymphadenopathy noted.   Data Reviewed: Basic Metabolic Panel:  Recent Labs Lab 11/25/13 0930 11/26/13 0912  NA 140 140  K 4.0 4.2  CL 103 106  CO2 23 24  GLUCOSE 93 94  BUN 6 7  CREATININE 0.69 0.68  CALCIUM 9.5 8.4   Liver Function Tests:  Recent Labs Lab 11/25/13 0930 11/26/13 0912  AST 22 20  ALT 11 9  ALKPHOS 55 63   BILITOT 2.0* 1.8*  PROT 7.8 6.5  ALBUMIN 4.4 3.5   No results found for this basename: LIPASE, AMYLASE,  in the last 168 hours No results found for this basename: AMMONIA,  in the last 168 hours CBC:  Recent Labs Lab 11/25/13 0930 11/26/13 0912  WBC 10.9* 12.1*  NEUTROABS 7.0 6.1  HGB 10.3* 8.7*  HCT 29.0* 25.0*  MCV 87.3 88.7  PLT 551* 459*      Studies: Dg Chest 2 View  11/25/2013   CLINICAL DATA:  Posterior left chest discomfort without shortness of breath or cough; history of sickle cell disease  EXAM: CHEST  2 VIEW  COMPARISON:  Portable chest x-ray of Sep 23, 2013  FINDINGS: The lungs are well-expanded and clear. The heart and mediastinal structures are normal. There is no pleural effusion. The bony thorax is unremarkable with exception of chronic vertebral body changes consistent with sickle cell disease.  IMPRESSION: No active cardiopulmonary disease.   Electronically Signed   By: David  Martinique   On: 11/25/2013 08:04    Scheduled Meds: . folic acid  1 mg Oral Daily  . heparin  5,000 Units Subcutaneous 3 times per day  . HYDROmorphone PCA 0.3 mg/mL   Intravenous 6 times per day  . hydroxyurea  1,500 mg Oral Q breakfast  . ketorolac  30 mg Intravenous 3 times per day  . methadone  5 mg Oral QHS  . senna-docusate  1 tablet Oral BID   Continuous Infusions: . dextrose 5 % and 0.45% NaCl 75 mL/hr at 11/27/13 1027    Total time 39 minutes.

## 2013-11-28 MED ORDER — OXYCODONE HCL 5 MG PO TABS
15.0000 mg | ORAL_TABLET | ORAL | Status: DC
  Administered 2013-11-28 – 2013-11-30 (×14): 15 mg via ORAL
  Filled 2013-11-28 (×14): qty 3
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
## 2013-11-29 ENCOUNTER — Inpatient Hospital Stay (HOSPITAL_COMMUNITY): Payer: Medicare Other

## 2013-11-29 ENCOUNTER — Telehealth: Payer: Self-pay | Admitting: Internal Medicine

## 2013-11-29 DIAGNOSIS — D571 Sickle-cell disease without crisis: Secondary | ICD-10-CM

## 2013-11-29 LAB — CBC WITH DIFFERENTIAL/PLATELET
Basophils Absolute: 0.1 10*3/uL (ref 0.0–0.1)
Basophils Relative: 1 % (ref 0–1)
Eosinophils Absolute: 0.7 10*3/uL (ref 0.0–0.7)
Eosinophils Relative: 5 % (ref 0–5)
HCT: 23.5 % — ABNORMAL LOW (ref 36.0–46.0)
Hemoglobin: 8.5 g/dL — ABNORMAL LOW (ref 12.0–15.0)
Lymphocytes Relative: 22 % (ref 12–46)
Lymphs Abs: 3.2 10*3/uL (ref 0.7–4.0)
MCH: 32.1 pg (ref 26.0–34.0)
MCHC: 36.2 g/dL — ABNORMAL HIGH (ref 30.0–36.0)
MCV: 88.7 fL (ref 78.0–100.0)
Monocytes Absolute: 1.3 10*3/uL — ABNORMAL HIGH (ref 0.1–1.0)
Monocytes Relative: 9 % (ref 3–12)
Neutro Abs: 9.3 10*3/uL — ABNORMAL HIGH (ref 1.7–7.7)
Neutrophils Relative %: 63 % (ref 43–77)
Platelets: 460 10*3/uL — ABNORMAL HIGH (ref 150–400)
RBC: 2.65 MIL/uL — ABNORMAL LOW (ref 3.87–5.11)
RDW: 17.2 % — ABNORMAL HIGH (ref 11.5–15.5)
WBC: 14.6 10*3/uL — ABNORMAL HIGH (ref 4.0–10.5)

## 2013-11-29 LAB — LACTATE DEHYDROGENASE: LDH: 324 U/L — ABNORMAL HIGH (ref 94–250)

## 2013-11-29 MED ORDER — OXYCODONE HCL 10 MG PO TABS: 15.0000 mg | ORAL_TABLET | Freq: Four times a day (QID) | ORAL | Status: DC | PRN

## 2013-11-29 NOTE — Progress Notes (Signed)
SICKLE CELL SERVICE PROGRESS NOTE  [name] DVV:[id number] DOB: January 15, 1991 DOA: 11/25/2013 PCP: MATTHEWS,MICHELLE A., MD  Assessment/Plan: Principal Problem:   Sickle cell pain crisis Active Problems:   Back pain   Overweight(278.02)  1. Hb SS with crisis:   Pt's pain has decreased.  Will continue scheduled oral analgesics and decrease PCA bolus dose. Marland KitchenAlso continue Toradol and add Robaxin for muscle spasms. Anticipation discharge tomorrow. 2. SCD: Pt reports compliance with Hydrea. Still not on MTD. Continue Hydrea 1500 mg daily which will place patient at a dose of 18 mg/kg.  3. Psychosocial: Pt has been doing well with her SCD. Her quality of life has improved and she is working consistently at her job at [employer]: Full Code Family Communication: N/A Disposition Plan:  Psychologist, sport and exercise.  MATTHEWS,MICHELLE A.  Pager [phone]. If 7PM-7AM, please contact night-coverage.  11/29/2013, 4:20 PM  LOS: 4 days   Brief narrative: [name] is a 23 y.o. African American female with past medical history of sickle cell, [name] the hospital complaining about back pain. The pain started on Friday as 3/10 in intensity and worked its way up. Patient said she only took pain medication twice in the weekend because she trying to decrease her opioids consumption. She was also using OTC Excedrin without relief, patient mentioned this is her usually her sickle cell crisis comes. She denies any fever, chills denies any shortness of breath or other significant problems.  In the ED BMP is okay, CBC showed WBC of 10.9 and hemoglobin of 10.3. Patient requested hydromorphone PCA stating this is the only way her pain but controlled in the hospital.   Consultants:  None  Procedures:  None  Antibiotics:  None  HPI/Subjective: Pt states that her pain is improved with the current dose of PCA bolus. The pain is localized to legs and back.   Objective: Filed  Vitals:   11/29/13 1000 11/29/13 1157 11/29/13 1400 11/29/13 1407  BP: 112/68  120/70   Pulse: 92  100   Temp: 97.8 F (36.6 C)  98 F (36.7 C)   TempSrc: Oral  Oral   Resp: 14 9 16 16   Height:      Weight:      SpO2: 98% 94% 94% 95%   Weight change:   Intake/Output Summary (Last 24 hours) at 11/29/13 1620 Last data filed at 11/29/13 1400  Gross per 24 hour  Intake    480 ml  Output      0 ml  Net    480 ml    General: Alert, awake, oriented x3, in no acute distress.  HEENT: [place]/AT PEERL, EOMI, anicteric Neck: Trachea midline,  no masses, no thyromegal,y no JVD, no carotid bruit OROPHARYNX:  Moist, No exudate/ erythema/lesions.  Heart: Regular rate and rhythm, without murmurs, rubs, gallops, PMI non-displaced, no heaves or thrills on palpation.  Lungs: Clear to auscultation, no wheezing or rhonchi noted. No increased vocal fremitus resonant to percussion  Abdomen: Soft, nontender, nondistended, positive bowel sounds, no masses no hepatosplenomegaly noted..  Neuro: No focal neurological deficits noted cranial nerves II through XII grossly intact. DTRs 2+ bilaterally upper and lower extremities. Strength normal in bilateral upper and lower extremities. Musculoskeletal: No warm swelling or erythema around joints, no spinal tenderness noted. Psychiatric: Patient alert and oriented x3, good insight and cognition, good recent to remote recall. Lymph node survey: No cervical axillary or inguinal lymphadenopathy noted.   Data Reviewed: Basic Metabolic Panel:  Recent  Labs Lab 11/25/13 0930 11/26/13 0912  NA 140 140  K 4.0 4.2  CL 103 106  CO2 23 24  GLUCOSE 93 94  BUN 6 7  CREATININE 0.69 0.68  CALCIUM 9.5 8.4   Liver Function Tests:  Recent Labs Lab 11/25/13 0930 11/26/13 0912  AST 22 20  ALT 11 9  ALKPHOS 55 63  BILITOT 2.0* 1.8*  PROT 7.8 6.5  ALBUMIN 4.4 3.5   No results found for this basename: LIPASE, AMYLASE,  in the last 168 hours No results found for  this basename: AMMONIA,  in the last 168 hours CBC:  Recent Labs Lab 11/25/13 0930 11/26/13 0912 11/29/13 0940  WBC 10.9* 12.1* 14.6*  NEUTROABS 7.0 6.1 9.3*  HGB 10.3* 8.7* 8.5*  HCT 29.0* 25.0* 23.5*  MCV 87.3 88.7 88.7  PLT 551* 459* 460*      Studies: Dg Chest 2 View  11/25/2013   CLINICAL DATA:  Posterior left chest discomfort without shortness of breath or cough; history of sickle cell disease  EXAM: CHEST  2 VIEW  COMPARISON:  Portable chest x-ray of Sep 23, 2013  FINDINGS: The lungs are well-expanded and clear. The heart and mediastinal structures are normal. There is no pleural effusion. The bony thorax is unremarkable with exception of chronic vertebral body changes consistent with sickle cell disease.  IMPRESSION: No active cardiopulmonary disease.   Electronically Signed   By: David  Martinique   On: 11/25/2013 08:04    Scheduled Meds: . folic acid  1 mg Oral Daily  . heparin  5,000 Units Subcutaneous 3 times per day  . HYDROmorphone PCA 0.3 mg/mL   Intravenous 6 times per day  . hydroxyurea  1,500 mg Oral Q breakfast  . methadone  5 mg Oral QHS  . methocarbamol  1,000 mg Oral QID  . oxyCODONE  15 mg Oral Q4H  . senna-docusate  1 tablet Oral BID   Continuous Infusions: . dextrose 5 % and 0.45% NaCl 10 mL/hr at 11/28/13 1100    Total time 35 minutes.

## 2013-11-29 NOTE — Telephone Encounter (Signed)
Re-ordered Oxycodone 15 mg (Take 1.5 tablets) every 6 hours as needed for moderate to severe pain. Reviewed [place] Substance Reporting system prior to reorder

## 2013-11-30 DIAGNOSIS — W19XXXA Unspecified fall, initial encounter: Secondary | ICD-10-CM

## 2013-11-30 MED ORDER — METHOCARBAMOL 750 MG PO TABS: 750.0000 mg | ORAL_TABLET | ORAL | Status: DC | PRN

## 2013-11-30 MED ORDER — HYDROXYUREA 500 MG PO CAPS: 1500.0000 mg | ORAL_CAPSULE | Freq: Every day | ORAL | Status: DC

## 2013-11-30 NOTE — Discharge Summary (Signed)
[name] MRN: [id number] DOB/AGE: 22/09/1990 22 y.o.  Admit date: 11/25/2013 Discharge date: 11/30/2013  Primary Care Physician:  MATTHEWS,MICHELLE A., MD   Discharge Diagnoses:   Patient Active Problem List   Diagnosis Date Noted  . Mood swings 11/08/2011    Priority: High  . Depression 01/06/2011    Priority: High  . SICKLE CELL ANEMIA 01/08/2009    Priority: High  . Anemia 10/05/2012    Priority: Medium  . 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 Tobacco dependence 08/30/2013  . Frequent stools 08/27/2013  . Pain, joint, upper arm, right 08/27/2013  . Right groin pain 08/15/2013  . Nausea alone 08/15/2013  . Abscess of right groin 08/15/2013  . Sickle cell crisis 07/31/2013  . Hyperkalemia 07/02/2013  . Foul smelling vaginal discharge 06/19/2013  . OCP (oral contraceptive pills) initiation 06/19/2013  . Unprotected sexual intercourse 06/19/2013  . Leg pain 05/28/2013  . Sickle cell anemia with pain 05/28/2013  . Unspecified constipation 04/25/2013  . 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 Chronic pain 02/26/2013  . Immunization due 02/26/2013  . Protein-calorie malnutrition, severe 02/11/2013  . Streptococcal sore throat 12/31/2012    Class: Acute  . Fever, unspecified 10/06/2012  . Leukocytosis, unspecified 10/05/2012  . Sickle cell anemia with crisis   . Chronic back pain   . Leukocytosis 03/31/2012  . 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 Sickle cell pain crisis 03/29/2012  . Headache 02/13/2012  . Nausea & vomiting 02/13/2012  . Tachycardia 11/22/2011  . Right thigh pain 11/08/2011  . Migraines 11/08/2011  . Vaso-occlusive sickle cell crisis 10/08/2011  . Overweight(278.02) 05/24/2011  . Stress 04/29/2011  . GERD (gastroesophageal reflux disease) 02/17/2011  . Back pain 09/17/2010  . Active smoker 08/09/2010  . 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     hydroxyurea 500 MG capsule  Commonly known as:  HYDREA  Take 3 capsules (1,500 mg total) by mouth daily with breakfast. May take with food to minimize GI side effects.     methadone 5 MG tablet  Commonly known as:  DOLOPHINE  Take 1 tablet (5 mg total) by mouth at bedtime.     methocarbamol 750 MG tablet  Commonly known as:  ROBAXIN  Take 1 tablet (750 mg total) by mouth every other day as needed for muscle spasms.     Oxycodone HCl 10 MG Tabs  Take 1.5 tablets (15 mg total) by mouth every 6 (six) hours as needed (For moderate to severe pain).          SIGNIFICANT DIAGNOSTIC STUDIES:  Dg Chest 2 View  11/25/2013   CLINICAL DATA:  Posterior left chest discomfort without shortness of breath or cough; history of sickle cell disease  EXAM: CHEST  2 VIEW  COMPARISON:  Portable chest x-ray of Sep 23, 2013  FINDINGS: The lungs are well-expanded and clear. The heart and mediastinal structures are normal. There is no pleural effusion. The bony thorax is unremarkable with exception of chronic vertebral body changes consistent with sickle cell disease.  IMPRESSION: No active cardiopulmonary disease.   Electronically Signed   By: David  Martinique   On: 11/25/2013 08:04   Dg Hip Complete Left  11/29/2013  CLINICAL DATA:  Fall, left hip pain  EXAM: LEFT HIP - COMPLETE 2+ VIEW  COMPARISON:  None.  FINDINGS: There is no evidence of hip fracture or dislocation. There is no evidence of arthropathy or other focal bone abnormality.  IMPRESSION: No acute osseous injury of the left hip.   Electronically Signed   By: Kathreen Devoid   On: 11/29/2013 23:18      No results found for this or any previous visit (from the past 240 hour(s)).  BRIEF ADMITTING H & P: [name] is a 23 y.o. African American female with past medical history of sickle cell, [name]  complaining about back pain. The pain started on Friday as 3/10 in intensity and worked its way up. Patient said she only took pain medication twice in the weekend because she trying to decrease her opioids consumption. She was also using OTC Excedrin without relief, patient mentioned this is her usually her sickle cell crisis comes. She denies any fever, chills denies any shortness of breath or other significant problems.  In the ED BMP is okay, CBC showed WBC of 10.9 and hemoglobin of 10.3. Patient requested hydromorphone PCA stating this is the only way her pain but controlled in the hospital    Hospital Course:  Present on Admission:  . Sickle cell pain crisis: Pt was admitted with vaso-occlusive episode. She was managed with her individualized PCA dosing, Toradol and hypotonic IVF. She was then transitioned to oral analgesics and discharged home on her pre-hospital regimen. Her hospital course was uneventful and she was discharged in stable condition. . Muscle Spasms: Robaxin was added to patient's regimen during hospitalization and she was discharged on a limited prespriction. This is not intended to be one of her chronic medications. . Leukocytosis: Secondary to crisis. Resolved at time of discharge.   Disposition and Follow-up:  Pt discharged in stable condition. She is to follow up in the clinic as scheduled.     Discharge Instructions   Diet general    Complete by:  As directed      Increase activity slowly    Complete by:  As directed            DISCHARGE EXAM:  General: Alert, awake, oriented x3, in no acute distress. Vital Signs: BP 119/77, HR 91, T 98.4 F (36.9 C), temperature source Oral, RR 17, height 5\' 3"  (1.6 m), weight 179 lb 1.6 oz (81.239 kg), last menstrual period 11/04/2013, SpO2 100.00%. HEENT: [place]/AT PEERL, EOMI, anicteric  Neck: Trachea midline, no masses, no thyromegal,y no JVD, no carotid bruit  OROPHARYNX: Moist, No exudate/ erythema/lesions.  Heart:  Regular rate and rhythm, without murmurs, rubs, gallops, PMI non-displaced, no heaves or thrills on palpation.  Lungs: Clear to auscultation, no wheezing or rhonchi noted. No increased vocal fremitus resonant to percussion  Abdomen: Soft, nontender, nondistended, positive bowel sounds, no masses no hepatosplenomegaly noted..  Neuro: No focal neurological deficits noted cranial nerves II through XII grossly intact. DTRs 2+ bilaterally upper and lower extremities. Strength normal in bilateral upper and lower extremities.  Musculoskeletal: No warm swelling or erythema around joints, no spinal tenderness noted.  Psychiatric: Patient alert and oriented x3, good insight and cognition, good recent to remote recall.  Lymph node survey: No cervical axillary or inguinal lymphadenopathy noted.   Recent Labs  11/29/13 0940  WBC 14.6*  NEUTROABS 9.3*  HGB 8.5*  HCT 23.5*  MCV 88.7  PLT 460*   Total time spent in decision  making and face to face was greater than 30 minutes. Signed: MATTHEWS,MICHELLE A. 11/30/2013, 10:09 AM

## 2013-11-30 NOTE — Progress Notes (Signed)
Pt has been removing the tape on her iv, the nurse talked to her several times to refrain from removing the tape but she keeps doing it. Will keep monitoring

## 2013-11-30 NOTE — Progress Notes (Signed)
11/29/13 2140  What Happened  Was fall witnessed? No  Was patient injured? No  Patient found on floor;in bathroom  Found by Staff-comment Dorris Fetch NT, pt had called)  Stated prior activity bathroom-unassisted  Follow Up  MD notified Walden Field NP  Time MD notified 2145  Family notified Yes-comment Leonel Ramsay)  Time family notified 2150  Additional tests Yes-comment (xray of back and left hip)  Adult Fall Risk Assessment  Risk Factor Category (scoring not indicated) Fall has occurred during this admission (document High fall risk)  Patient's Fall Risk High Fall Risk (>13 points)  Adult Fall Risk Interventions  Required Bundle Interventions *See Row Information* High fall risk - low, moderate, and high requirements implemented  Additional Interventions Bed alarm not indicated with the bundle;Fall risk signage;Specialty bed:  Low bed

## 2013-12-10 ENCOUNTER — Telehealth: Payer: Self-pay | Admitting: Internal Medicine

## 2013-12-10 DIAGNOSIS — D571 Sickle-cell disease without crisis: Secondary | ICD-10-CM

## 2013-12-10 NOTE — Telephone Encounter (Signed)
Refill on oxycodone 10 mg. LOV 10/28/2013. Please advise. Thanks!

## 2013-12-11 MED ORDER — OXYCODONE HCL 10 MG PO TABS: 15.0000 mg | ORAL_TABLET | Freq: Four times a day (QID) | ORAL | Status: DC | PRN

## 2013-12-11 NOTE — Telephone Encounter (Signed)
Re-ordered Oxycodone 15 mg (Take 1.5 tablets) every 6 hours as needed for moderate to severe pain.

## 2013-12-23 ENCOUNTER — Telehealth: Payer: Self-pay | Admitting: Internal Medicine

## 2013-12-23 DIAGNOSIS — D571 Sickle-cell disease without crisis: Secondary | ICD-10-CM

## 2013-12-23 MED ORDER — OXYCODONE HCL 10 MG PO TABS: 15.0000 mg | ORAL_TABLET | Freq: Four times a day (QID) | ORAL | Status: DC | PRN

## 2013-12-23 MED ORDER — METHADONE HCL 5 MG PO TABS: 5.0000 mg | ORAL_TABLET | Freq: Every day | ORAL | Status: DC

## 2013-12-23 NOTE — Telephone Encounter (Signed)
REFILL REQUEST FOR METHADONE 5MG  AND OXYCODONE 10MG . LOV 10/28/2013. PLEASE ADVISE. THANKS!

## 2013-12-23 NOTE — Telephone Encounter (Signed)
Re-ordered Oxycodone 15 mg (Take 1.5 tablets) every 6 hours as needed for moderate to severe pain and for Methadone 5 mg #30 tabs

## 2013-12-27 IMAGING — CR DG CHEST 2V
2 series · 2 of 2 positions shown · non-contrast
Comparison: 01/27/2013

CLINICAL DATA: Sickle cell crisis, cough, congestion.

EXAM:
CHEST  2 VIEW

[w chest lat]
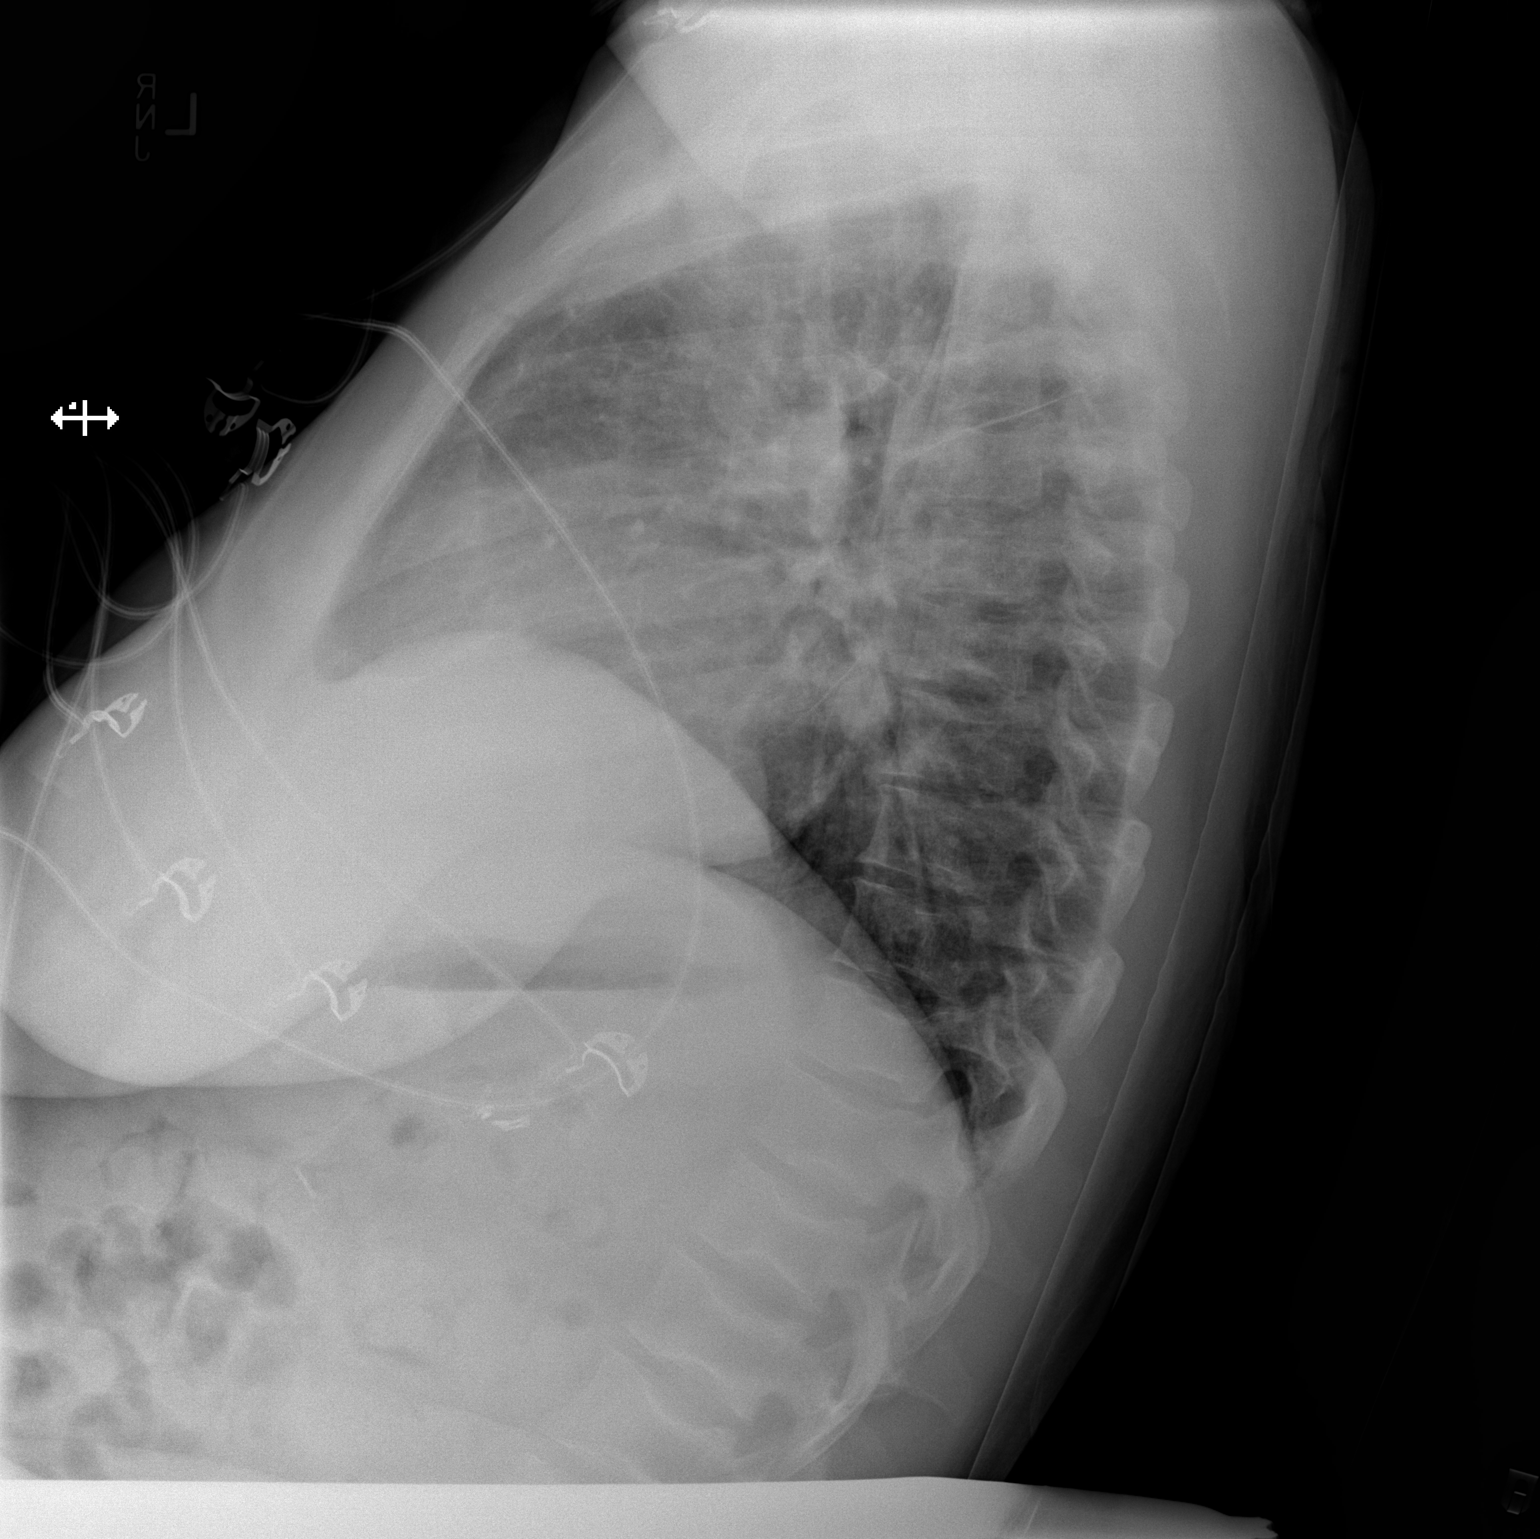

[x chest ap]
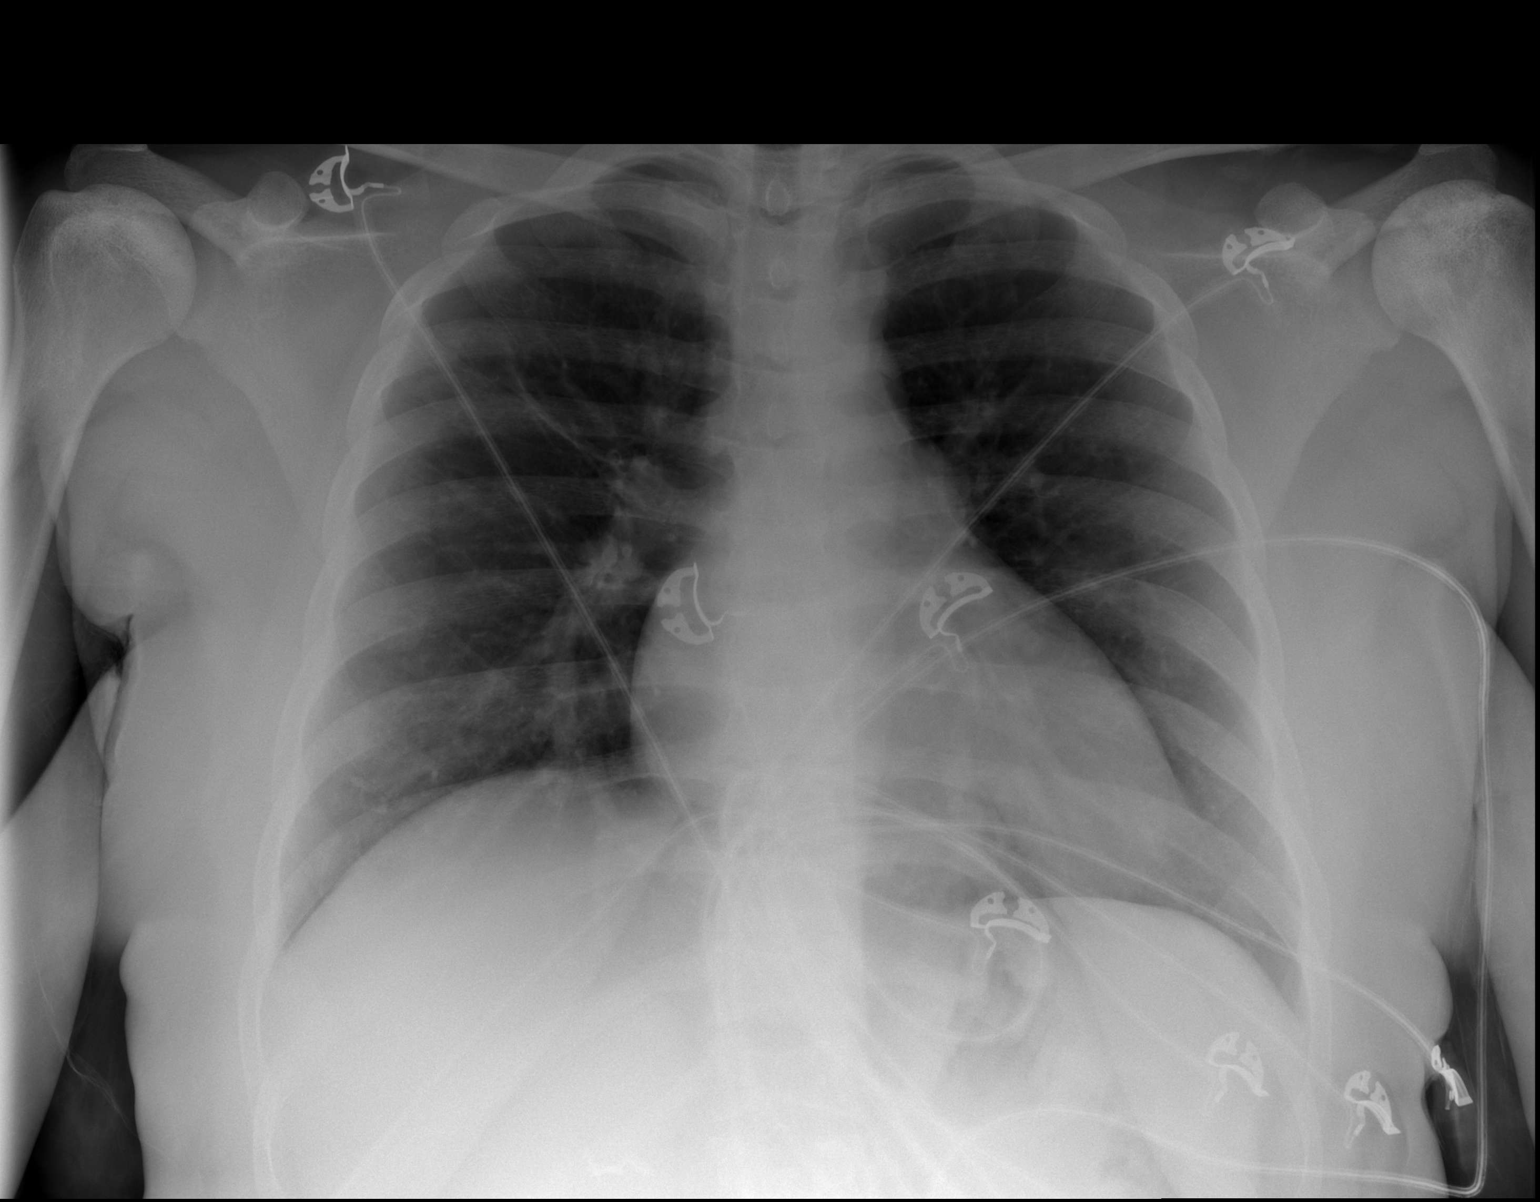

[2 of 2 positions shown; findings below may reference images not displayed]

FINDINGS: Heart and mediastinal contours are within normal limits. No focal
opacities or effusions. No acute bony abnormality.
IMPRESSION: No active cardiopulmonary disease.

## 2014-01-07 ENCOUNTER — Telehealth: Payer: Self-pay

## 2014-01-07 DIAGNOSIS — D571 Sickle-cell disease without crisis: Secondary | ICD-10-CM

## 2014-01-07 MED ORDER — OXYCODONE HCL 10 MG PO TABS: 15.0000 mg | ORAL_TABLET | Freq: Four times a day (QID) | ORAL | Status: DC | PRN

## 2014-01-07 NOTE — Telephone Encounter (Signed)
Refill request for oxycodone 10mg . LOV 10/28/2013. Please advise. Thanks!

## 2014-01-07 NOTE — Telephone Encounter (Signed)
Re-ordered Oxycodone 15 mg (Take 1.5 tablets) every 6 hours as needed for moderate to severe pain.

## 2014-01-10 ENCOUNTER — Telehealth (HOSPITAL_COMMUNITY): Payer: Self-pay | Admitting: *Deleted

## 2014-01-10 NOTE — Telephone Encounter (Signed)
Unable to reach.

## 2014-01-13 ENCOUNTER — Telehealth (HOSPITAL_COMMUNITY): Payer: Self-pay | Admitting: *Deleted

## 2014-01-13 ENCOUNTER — Inpatient Hospital Stay (HOSPITAL_COMMUNITY)
Admission: EM | Admit: 2014-01-13 | Discharge: 2014-01-16 | DRG: 811 | Disposition: A | Discharge status: Home / Self Care | Payer: Medicare Other | Attending: Internal Medicine | Admitting: Internal Medicine

## 2014-01-13 ENCOUNTER — Encounter (HOSPITAL_COMMUNITY): Payer: Self-pay

## 2014-01-13 ENCOUNTER — Emergency Department (HOSPITAL_COMMUNITY): Payer: Medicare Other

## 2014-01-13 DIAGNOSIS — D571 Sickle-cell disease without crisis: Secondary | ICD-10-CM | POA: Diagnosis not present

## 2014-01-13 DIAGNOSIS — D5700 Hb-SS disease with crisis, unspecified: Secondary | ICD-10-CM | POA: Diagnosis not present

## 2014-01-13 DIAGNOSIS — R079 Chest pain, unspecified: Secondary | ICD-10-CM | POA: Diagnosis not present

## 2014-01-13 DIAGNOSIS — G8929 Other chronic pain: Secondary | ICD-10-CM

## 2014-01-13 DIAGNOSIS — K219 Gastro-esophageal reflux disease without esophagitis: Secondary | ICD-10-CM | POA: Diagnosis present

## 2014-01-13 DIAGNOSIS — F17200 Nicotine dependence, unspecified, uncomplicated: Secondary | ICD-10-CM

## 2014-01-13 DIAGNOSIS — K5900 Constipation, unspecified: Secondary | ICD-10-CM | POA: Diagnosis present

## 2014-01-13 DIAGNOSIS — F3289 Other specified depressive episodes: Secondary | ICD-10-CM | POA: Diagnosis present

## 2014-01-13 DIAGNOSIS — J189 Pneumonia, unspecified organism: Secondary | ICD-10-CM | POA: Diagnosis present

## 2014-01-13 DIAGNOSIS — F329 Major depressive disorder, single episode, unspecified: Secondary | ICD-10-CM

## 2014-01-13 DIAGNOSIS — Z87891 Personal history of nicotine dependence: Secondary | ICD-10-CM

## 2014-01-13 DIAGNOSIS — N390 Urinary tract infection, site not specified: Secondary | ICD-10-CM | POA: Diagnosis present

## 2014-01-13 DIAGNOSIS — F32A Depression, unspecified: Secondary | ICD-10-CM

## 2014-01-13 DIAGNOSIS — Z79899 Other long term (current) drug therapy: Secondary | ICD-10-CM | POA: Diagnosis not present

## 2014-01-13 DIAGNOSIS — D72829 Elevated white blood cell count, unspecified: Secondary | ICD-10-CM

## 2014-01-13 DIAGNOSIS — M549 Dorsalgia, unspecified: Secondary | ICD-10-CM | POA: Diagnosis not present

## 2014-01-13 LAB — I-STAT TROPONIN, ED: Troponin i, poc: 0 ng/mL (ref 0.00–0.08)

## 2014-01-13 LAB — COMPREHENSIVE METABOLIC PANEL
ALT: 13 U/L (ref 0–35)
ANION GAP: 12 (ref 5–15)
AST: 17 U/L (ref 0–37)
Albumin: 3.9 g/dL (ref 3.5–5.2)
Alkaline Phosphatase: 64 U/L (ref 39–117)
BUN: 10 mg/dL (ref 6–23)
CALCIUM: 9 mg/dL (ref 8.4–10.5)
CO2: 24 mEq/L (ref 19–32)
Chloride: 104 mEq/L (ref 96–112)
Creatinine, Ser: 0.7 mg/dL (ref 0.50–1.10)
GLUCOSE: 95 mg/dL (ref 70–99)
Potassium: 3.8 mEq/L (ref 3.7–5.3)
SODIUM: 140 meq/L (ref 137–147)
Total Bilirubin: 2.9 mg/dL — ABNORMAL HIGH (ref 0.3–1.2)
Total Protein: 6.8 g/dL (ref 6.0–8.3)

## 2014-01-13 LAB — CBC WITH DIFFERENTIAL/PLATELET
Basophils Absolute: 0.1 10*3/uL (ref 0.0–0.1)
Basophils Relative: 1 % (ref 0–1)
EOS ABS: 0.5 10*3/uL (ref 0.0–0.7)
EOS PCT: 3 % (ref 0–5)
HEMATOCRIT: 22.6 % — AB (ref 36.0–46.0)
Hemoglobin: 8.2 g/dL — ABNORMAL LOW (ref 12.0–15.0)
LYMPHS PCT: 20 % (ref 12–46)
Lymphs Abs: 3.3 10*3/uL (ref 0.7–4.0)
MCH: 31.2 pg (ref 26.0–34.0)
MCHC: 36.3 g/dL — ABNORMAL HIGH (ref 30.0–36.0)
MCV: 85.9 fL (ref 78.0–100.0)
Monocytes Absolute: 1.5 10*3/uL — ABNORMAL HIGH (ref 0.1–1.0)
Monocytes Relative: 9 % (ref 3–12)
Neutro Abs: 11 10*3/uL — ABNORMAL HIGH (ref 1.7–7.7)
Neutrophils Relative %: 67 % (ref 43–77)
PLATELETS: 432 10*3/uL — AB (ref 150–400)
RBC: 2.63 MIL/uL — AB (ref 3.87–5.11)
RDW: 16.7 % — ABNORMAL HIGH (ref 11.5–15.5)
WBC: 16.3 10*3/uL — ABNORMAL HIGH (ref 4.0–10.5)

## 2014-01-13 LAB — MAGNESIUM: Magnesium: 2 mg/dL (ref 1.5–2.5)

## 2014-01-13 LAB — URINALYSIS, ROUTINE W REFLEX MICROSCOPIC
BILIRUBIN URINE: NEGATIVE
Glucose, UA: NEGATIVE mg/dL
HGB URINE DIPSTICK: NEGATIVE
Ketones, ur: NEGATIVE mg/dL
Nitrite: NEGATIVE
PROTEIN: NEGATIVE mg/dL
Specific Gravity, Urine: 1.013 (ref 1.005–1.030)
Urobilinogen, UA: 1 mg/dL (ref 0.0–1.0)
pH: 6 (ref 5.0–8.0)

## 2014-01-13 LAB — URINE MICROSCOPIC-ADD ON

## 2014-01-13 LAB — RETICULOCYTES
RBC.: 2.63 MIL/uL — ABNORMAL LOW (ref 3.87–5.11)
RETIC COUNT ABSOLUTE: 426.1 10*3/uL — AB (ref 19.0–186.0)
RETIC CT PCT: 16.2 % — AB (ref 0.4–3.1)

## 2014-01-13 LAB — POC URINE PREG, ED: Preg Test, Ur: NEGATIVE

## 2014-01-13 MED ORDER — ACETAMINOPHEN 325 MG PO TABS: 650.0000 mg | ORAL_TABLET | ORAL | Status: DC | PRN

## 2014-01-13 MED ORDER — HYDROMORPHONE HCL PF 1 MG/ML IJ SOLN
1.0000 mg | INTRAMUSCULAR | Status: AC | PRN
  Administered 2014-01-13: 1 mg via INTRAVENOUS
  Filled 2014-01-13 (×2): qty 1

## 2014-01-13 MED ORDER — METHADONE HCL 5 MG PO TABS
5.0000 mg | ORAL_TABLET | Freq: Every day | ORAL | Status: DC
  Administered 2014-01-13 – 2014-01-15 (×3): 5 mg via ORAL
  Filled 2014-01-13 (×3): qty 1

## 2014-01-13 MED ORDER — DIPHENHYDRAMINE HCL 25 MG PO CAPS: 25.0000 mg | ORAL_CAPSULE | ORAL | Status: DC | PRN

## 2014-01-13 MED ORDER — OXYCODONE HCL 5 MG PO TABS: 15.0000 mg | ORAL_TABLET | Freq: Four times a day (QID) | ORAL | Status: DC | PRN

## 2014-01-13 MED ORDER — METHOCARBAMOL 750 MG PO TABS
750.0000 mg | ORAL_TABLET | ORAL | Status: DC
  Administered 2014-01-14: 750 mg via ORAL
  Filled 2014-01-13 (×2): qty 1

## 2014-01-13 MED ORDER — HYDROMORPHONE HCL PF 1 MG/ML IJ SOLN
1.0000 mg | Freq: Once | INTRAMUSCULAR | Status: AC
  Administered 2014-01-13: 1 mg via INTRAVENOUS

## 2014-01-13 MED ORDER — ONDANSETRON HCL 4 MG/2ML IJ SOLN
4.0000 mg | Freq: Once | INTRAMUSCULAR | Status: AC
  Administered 2014-01-13: 4 mg via INTRAVENOUS
  Filled 2014-01-13: qty 2

## 2014-01-13 MED ORDER — DIPHENHYDRAMINE HCL 50 MG/ML IJ SOLN
12.5000 mg | INTRAMUSCULAR | Status: DC | PRN
  Administered 2014-01-13 – 2014-01-14 (×3): 25 mg via INTRAVENOUS
  Filled 2014-01-13 (×3): qty 1

## 2014-01-13 MED ORDER — HYDROMORPHONE HCL PF 1 MG/ML IJ SOLN
1.0000 mg | INTRAMUSCULAR | Status: DC | PRN
  Administered 2014-01-14 (×2): 1 mg via INTRAVENOUS
  Filled 2014-01-13 (×2): qty 1

## 2014-01-13 MED ORDER — SODIUM CHLORIDE 0.9 % IV SOLN
INTRAVENOUS | Status: DC
  Administered 2014-01-13: 1000 mL via INTRAVENOUS
  Administered 2014-01-14: 11:00:00 via INTRAVENOUS

## 2014-01-13 MED ORDER — SODIUM CHLORIDE 0.9 % IV SOLN
INTRAVENOUS | Status: DC
  Administered 2014-01-13: 18:00:00 via INTRAVENOUS

## 2014-01-13 MED ORDER — HYDROXYUREA 500 MG PO CAPS
1500.0000 mg | ORAL_CAPSULE | Freq: Every day | ORAL | Status: DC
  Administered 2014-01-13 – 2014-01-16 (×4): 1500 mg via ORAL
  Filled 2014-01-13 (×4): qty 3

## 2014-01-13 MED ORDER — FOLIC ACID 1 MG PO TABS
1.0000 mg | ORAL_TABLET | Freq: Every day | ORAL | Status: DC
  Administered 2014-01-13 – 2014-01-16 (×4): 1 mg via ORAL
  Filled 2014-01-13 (×4): qty 1

## 2014-01-13 MED ORDER — HYDROMORPHONE HCL PF 2 MG/ML IJ SOLN
2.0000 mg | Freq: Once | INTRAMUSCULAR | Status: AC
  Administered 2014-01-13: 2 mg via INTRAVENOUS
  Filled 2014-01-13: qty 1

## 2014-01-13 MED ORDER — DEXTROSE 5 % IV SOLN
1.0000 g | INTRAVENOUS | Status: DC
  Administered 2014-01-13: 1 g via INTRAVENOUS
  Filled 2014-01-13 (×2): qty 10

## 2014-01-13 MED ORDER — ONDANSETRON HCL 4 MG/2ML IJ SOLN: 4.0000 mg | Freq: Three times a day (TID) | INTRAMUSCULAR | Status: AC | PRN

## 2014-01-13 MED ORDER — SODIUM CHLORIDE 0.9 % IV BOLUS (SEPSIS)
1000.0000 mL | Freq: Once | INTRAVENOUS | Status: AC
  Administered 2014-01-13: 1000 mL via INTRAVENOUS

## 2014-01-13 MED ORDER — SODIUM CHLORIDE 0.9 % IV SOLN
INTRAVENOUS | Status: AC
  Administered 2014-01-13: 18:00:00 via INTRAVENOUS

## 2014-01-13 MED ORDER — FENTANYL CITRATE 0.05 MG/ML IJ SOLN: 100.0000 ug | Freq: Once | INTRAMUSCULAR | Status: DC

## 2014-01-13 MED ORDER — PROMETHAZINE HCL 25 MG RE SUPP: 12.5000 mg | RECTAL | Status: DC | PRN

## 2014-01-13 MED ORDER — HYDROMORPHONE HCL PF 2 MG/ML IJ SOLN
2.0000 mg | Freq: Once | INTRAMUSCULAR | Status: AC
  Administered 2014-01-14: 2 mg via INTRAVENOUS
  Filled 2014-01-13: qty 1

## 2014-01-13 MED ORDER — NALOXONE HCL 0.4 MG/ML IJ SOLN: 0.4000 mg | INTRAMUSCULAR | Status: DC | PRN

## 2014-01-13 MED ORDER — HYDROMORPHONE HCL PF 1 MG/ML IJ SOLN
1.0000 mg | Freq: Once | INTRAMUSCULAR | Status: AC
  Administered 2014-01-13: 1 mg via INTRAVENOUS
  Filled 2014-01-13: qty 1

## 2014-01-13 MED ORDER — SODIUM CHLORIDE 0.9 % IJ SOLN: 9.0000 mL | INTRAMUSCULAR | Status: DC | PRN

## 2014-01-13 MED ORDER — HYDROMORPHONE HCL PF 1 MG/ML IJ SOLN
1.0000 mg | Freq: Once | INTRAMUSCULAR | Status: AC
  Administered 2014-01-13: 1 mg via INTRAMUSCULAR
  Filled 2014-01-13: qty 1

## 2014-01-13 MED ORDER — ACETAMINOPHEN 650 MG RE SUPP: 650.0000 mg | RECTAL | Status: DC | PRN

## 2014-01-13 MED ORDER — PROMETHAZINE HCL 25 MG PO TABS: 12.5000 mg | ORAL_TABLET | ORAL | Status: DC | PRN

## 2014-01-13 MED ORDER — DIPHENHYDRAMINE HCL 50 MG/ML IJ SOLN
25.0000 mg | Freq: Once | INTRAMUSCULAR | Status: AC
  Administered 2014-01-13: 25 mg via INTRAVENOUS
  Filled 2014-01-13: qty 1

## 2014-01-13 MED ORDER — ONDANSETRON HCL 4 MG/2ML IJ SOLN: 4.0000 mg | Freq: Once | INTRAMUSCULAR | Status: DC

## 2014-01-13 MED ORDER — FOLIC ACID 1 MG PO TABS: 1.0000 mg | ORAL_TABLET | Freq: Every day | ORAL | Status: DC

## 2014-01-13 MED ORDER — HYDROMORPHONE 0.3 MG/ML IV SOLN
INTRAVENOUS | Status: DC
  Administered 2014-01-13: 18:00:00 via INTRAVENOUS
  Administered 2014-01-13: 2.39 mg via INTRAVENOUS
  Administered 2014-01-13: 2.41 mg via INTRAVENOUS
  Administered 2014-01-14: 2.1 mg via INTRAVENOUS
  Administered 2014-01-14: 04:00:00 via INTRAVENOUS
  Administered 2014-01-14: 1.8 mg via INTRAVENOUS
  Filled 2014-01-13 (×2): qty 25

## 2014-01-13 NOTE — Progress Notes (Signed)
Patient concerned about her PCA dosage/settings.  Dr. Charlies Silvers notified and she is coming to assess patient.  [name] [facility]  01/13/2014

## 2014-01-13 NOTE — H&P (Signed)
[facility] History and Physical  [name] EEF:[id number] DOB: 22-Aug-1990 DOA: 01/13/2014  Referring physician: ER physician PCP: MATTHEWS,MICHELLE A., MD   Chief Complaint: chest pain   HPI:  Patient is a 23 y.o. Female with sickle cell disease who presented to [facility] ED with main concern of one day duration of progressively worsening chest discomfort typical of her sickle cell pain, also associated with back pain, arms pain, waist pain. Pt describes pain as constant and 10/10 in severity with no specific alleviating or aggravating factors. Pt has denies fevers, chills, no specific urinary concerns, she has called her PCP earlier and was advised to come to the hospital for pain control.  In ED, BP was 138/108, HR 99, RR 20, T max 100.6 F and oxygen saturation 94% on room air. WBC count was elevated at 16.3, hemoglobin was 8.2. Urinalysis revealed small leukocytes. She was started on empiric rocephin on admission.   Assessment & Plan    Active Problems: Acute sickle cell pain crisis  Restarted home medications for pain control: oxycodone 15 mg PO every 6 hours PRN, methadone 5 mg at bedtime. Added dilaudid PCA and dilaudid 1 mg every 4 hours PRN for breakthrough pain.  Will continue supportive care with IV fluids, antiemetics as needed  Continue hydrea and folic acid.  Retic count on admission 16.2, manual ct 426.1. Urinary tract infection  UA revealed small leukocytes; in addition she had fever and elevated WBC count so we started empiric rocephin  Sickle cell anemia  Hemoglobin stable  No current indications for transfusion   DVT prophylaxis:   SCD's for DVT prophylaxis  Radiological Exams on Admission: Dg Chest 2 View   01/13/2014 There is no evidence of pulmonary edema or alveolar pneumonia. There are mild stable changes consistent with the sequelae of sickle cell disease.      EKG: pending   Code Status: Full Family Communication: Plan of care discussed  with the patient  Disposition Plan: Admit for further evaluation  Leisa Lenz, MD  [facility] Pager [phone]  Review of Systems:  Constitutional: Negative for fever, chills and malaise/fatigue. Negative for diaphoresis.  HENT: Negative for hearing loss, ear pain, nosebleeds, congestion, sore throat, neck pain, tinnitus and ear discharge.   Eyes: Negative for blurred vision, double vision, photophobia, pain, discharge and redness.  Respiratory: Negative for cough, hemoptysis, sputum production, shortness of breath, wheezing and stridor.   Cardiovascular: Negative for chest pain, palpitations, orthopnea, claudication and leg swelling.  Gastrointestinal: Negative for nausea, vomiting and abdominal pain. Negative for heartburn, constipation, blood in stool and melena.  Genitourinary: Negative for dysuria, urgency, frequency, hematuria and flank pain.  Musculoskeletal: per HPI.  Skin: Negative for itching and rash.  Neurological: Negative for dizziness and weakness. Negative for tingling, tremors, sensory change, speech change, focal weakness, loss of consciousness and headaches.  Endo/Heme/Allergies: Negative for environmental allergies and polydipsia. Does not bruise/bleed easily.  Psychiatric/Behavioral: Negative for suicidal ideas. The patient is not nervous/anxious.      Past Medical History  Diagnosis Date  . H/O: 1 miscarriage 03/22/2011    Pt reports 2 miscarriages.  [name] 01/08/2009  . Depression 01/06/2011  . GERD (gastroesophageal reflux disease) 02/17/2011  . Trichotillomania     h/o  . Blood transfusion     "lots"  . Sickle cell anemia with crisis   . Exertional dyspnea     "sometimes"  . Sickle cell anemia   . Headache(784.0)   . Migraines 11/08/11    "@  least twice/month"  . Chronic back pain     "very severe; have knot in my back; from tight muscle; take RX and exercise for it"  . Mood swings 11/08/11    "I go back and forth; real bad"  .  Pregnancy   . Blood dyscrasia     SICKLE CELL   Past Surgical History  Procedure Laterality Date  . Cholecystectomy  05/2010  . Dilation and curettage of uterus  02/20/11    S/P miscarriage   Social History:  reports that she quit smoking about 9 months ago. Her smoking use included Cigarettes. She has a .25 pack-year smoking history. She has never used smokeless tobacco. She reports that she drinks alcohol. She reports that she does not use illicit drugs.  Allergies  Allergen Reactions  . Carrot Oil Hives and Swelling  . Latex Rash    Family History:  Family History  Problem Relation Age of Onset  . Diabetes Mother   . Alcoholism    . Depression    . Hypercholesterolemia    . Hypertension    . Migraines    . Diabetes Maternal Grandmother   . Diabetes Paternal Grandmother      Prior to Admission medications   Medication Sig Start Date End Date Taking? Authorizing Provider  folic acid (FOLVITE) 1 MG tablet Take 1 mg by mouth daily.   Yes Historical Provider, MD  hydroxyurea (HYDREA) 500 MG capsule Take 1,500 mg by mouth daily. May take with food to minimize GI side effects.   Yes Historical Provider, MD  methadone (DOLOPHINE) 5 MG tablet Take 5 mg by mouth at bedtime.   Yes Historical Provider, MD  methocarbamol (ROBAXIN) 750 MG tablet Take 750 mg by mouth every other day.   Yes Historical Provider, MD  Oxycodone HCl 10 MG TABS Take 15 mg by mouth every 6 (six) hours as needed (moderate pain).   Yes Historical Provider, MD   Physical Exam: Filed Vitals:   01/13/14 1138 01/13/14 1406 01/13/14 1409 01/13/14 1714  BP: 106/67 111/53 111/53 138/88  Pulse: 87 93 88 99  Temp: 98.2 F (36.8 C)  98.7 F (37.1 C) 100.1 F (37.8 C)  TempSrc: Oral  Oral Oral  Resp: 20 18 16 19   SpO2: 97% 94% 96% 96%    Physical Exam  Constitutional: Appears well-developed and well-nourished. No distress.  HENT: Normocephalic. No tonsillar erythema or exudates Eyes: Conjunctivae and EOM are  normal. PERRLA, no scleral icterus.  Neck: Normal ROM. Neck supple. No JVD. No tracheal deviation. No thyromegaly.  CVS: RRR, S1/S2 +, no murmurs, no gallops, no carotid bruit.  Pulmonary: Effort and breath sounds normal, no stridor, rhonchi, wheezes, rales.  Abdominal: Soft. BS +,  no distension, tenderness, rebound or guarding.  Musculoskeletal: Normal range of motion. No edema and no tenderness.  Lymphadenopathy: No lymphadenopathy noted, cervical, inguinal. Neuro: Alert. Normal reflexes, muscle tone coordination. No focal neurologic deficits. Skin: Skin is warm and dry. No rash noted. Not diaphoretic. No erythema. No pallor.  Psychiatric: Normal mood and affect. Behavior, judgment, thought content normal.   Labs on Admission:  Basic Metabolic Panel:  Recent Labs Lab 01/13/14 1345  NA 140  K 3.8  CL 104  CO2 24  GLUCOSE 95  BUN 10  CREATININE 0.70  CALCIUM 9.0   Liver Function Tests:  Recent Labs Lab 01/13/14 1345  AST 17  ALT 13  ALKPHOS 64  BILITOT 2.9*  PROT 6.8  ALBUMIN 3.9  CBC:  Recent Labs Lab 01/13/14 1345  WBC 16.3*  NEUTROABS 11.0*  HGB 8.2*  HCT 22.6*  MCV 85.9  PLT 432*   Cardiac Enzymes: No results found for this basename: CKTOTAL, CKMB, CKMBINDEX, TROPONINI,  in the last 168 hours BNP: No components found with this basename: POCBNP,  CBG: No results found for this basename: GLUCAP,  in the last 168 hours  If 7PM-7AM, please contact night-coverage [email or web address] Password [facility] 01/13/2014, 5:48 PM

## 2014-01-13 NOTE — ED Notes (Signed)
IV team in room after 3 failed attempts of using the ultrasound to aid in IV access.

## 2014-01-13 NOTE — ED Notes (Signed)
Pt currently resting with eyes closed snoring in wheelchair while in triage room.

## 2014-01-13 NOTE — ED Notes (Signed)
All triage assessments and charting was done by Flossie Dibble, not by Apolonio Schneiders NT.

## 2014-01-13 NOTE — ED Provider Notes (Signed)
CSN: [id number]     Arrival date & time 01/13/14  1130 History   First MD Initiated Contact with Patient 01/13/14 1234     Chief Complaint  Patient presents with  . Sickle Cell Pain Crisis  . Chest Pain   Patient is a 23 y.o. female presenting with sickle cell pain and chest pain.  Sickle Cell Pain Crisis Associated symptoms: chest pain   Chest Pain   Patient is a 23 y.o. Female with history of sickle cell disease who presents to the ED with chest pain, back pain, and waist pain.  Patient states that the pain started this morning and feels similar to her typical sickle pain.  Pain is usually triggered by working too much and dehydration.  Patient tried taking oxycodone and drinking lots of water at home with no improvement.  Patient states that she has been taking her hydroxyurea as prescribed.  Patient states that she spoke with Dr. Zigmund Daniel who directed the patient to come here for pain control.  Patient denies fevers, chills, nausea, vomiting, shortness of breath, cough, dysuria, hematuria, melena, hematochezia, diarrhea, constipation, or leg pain.  Patient does admit to urinary frequency and urgency.  All other ROS are negative.  Past Medical History  Diagnosis Date  . H/O: 1 miscarriage 03/22/2011    Pt reports 2 miscarriages.  [name] 01/08/2009  . Depression 01/06/2011  . GERD (gastroesophageal reflux disease) 02/17/2011  . Trichotillomania     h/o  . Blood transfusion     "lots"  . Sickle cell anemia with crisis   . Exertional dyspnea     "sometimes"  . Sickle cell anemia   . Headache(784.0)   . Migraines 11/08/11    "@ least twice/month"  . Chronic back pain     "very severe; have knot in my back; from tight muscle; take RX and exercise for it"  . Mood swings 11/08/11    "I go back and forth; real bad"  . Pregnancy   . Blood dyscrasia     SICKLE CELL   Past Surgical History  Procedure Laterality Date  . Cholecystectomy  05/2010  . Dilation and curettage of  uterus  02/20/11    S/P miscarriage   Family History  Problem Relation Age of Onset  . Diabetes Mother   . Alcoholism    . Depression    . Hypercholesterolemia    . Hypertension    . Migraines    . Diabetes Maternal Grandmother   . Diabetes Paternal Grandmother    History  Substance Use Topics  . Smoking status: Former Smoker -- 0.25 packs/day for 1 years    Types: Cigarettes    Quit date: 03/25/2013  . Smokeless tobacco: Never Used  . Alcohol Use: Yes     Comment: pt states she quit marijuan in May 2013. Rare ETOH, + cigarettes.  She is enrolled in school   OB History   Grav Para Term Preterm Abortions TAB SAB Ect Mult Living   2    1  1         Obstetric Comments   Miscarried in October 2012 at about 7 weeks     Review of Systems  Cardiovascular: Positive for chest pain.    See HPI  Allergies  Carrot oil and Latex  Home Medications   Prior to Admission medications   Medication Sig Start Date End Date Taking? Authorizing Provider  folic acid (FOLVITE) 1 MG tablet Take 1 mg by mouth daily.  Yes Historical Provider, MD  hydroxyurea (HYDREA) 500 MG capsule Take 1,500 mg by mouth daily. May take with food to minimize GI side effects.   Yes Historical Provider, MD  methadone (DOLOPHINE) 5 MG tablet Take 5 mg by mouth at bedtime.   Yes Historical Provider, MD  methocarbamol (ROBAXIN) 750 MG tablet Take 750 mg by mouth every other day.   Yes Historical Provider, MD  Oxycodone HCl 10 MG TABS Take 15 mg by mouth every 6 (six) hours as needed (moderate pain).   Yes Historical Provider, MD   BP 138/88  Pulse 99  Temp(Src) 100.1 F (37.8 C) (Oral)  Resp 19  SpO2 96%  LMP 01/06/2014 Physical Exam  Nursing note and vitals reviewed. Constitutional: She is oriented to person, place, and time. She appears well-developed and well-nourished. No distress.  HENT:  Head: Normocephalic and atraumatic.  Mouth/Throat: Oropharynx is clear and moist. No oropharyngeal exudate.   Eyes: Conjunctivae and EOM are normal. Pupils are equal, round, and reactive to light. No scleral icterus.  Neck: Normal range of motion. Neck supple. No JVD present. No thyromegaly present.  Cardiovascular: Normal rate, regular rhythm, normal heart sounds and intact distal pulses.  Exam reveals no gallop and no friction rub.   No murmur heard. Pulmonary/Chest: Effort normal and breath sounds normal. No respiratory distress. She has no wheezes. She has no rales. She exhibits tenderness.  Abdominal: Soft. Bowel sounds are normal. She exhibits no distension and no mass. There is no tenderness. There is no rebound and no guarding.  Musculoskeletal: Normal range of motion.  Lymphadenopathy:    She has no cervical adenopathy.  Neurological: She is alert and oriented to person, place, and time. She has normal strength. No cranial nerve deficit or sensory deficit. Coordination normal.  Skin: Skin is warm and dry. She is not diaphoretic.  Psychiatric: She has a normal mood and affect. Her behavior is normal. Judgment and thought content normal.    ED Course  Procedures (including critical care time) Labs Review Labs Reviewed  CBC WITH DIFFERENTIAL - Abnormal; Notable for the following:    WBC 16.3 (*)    RBC 2.63 (*)    Hemoglobin 8.2 (*)    HCT 22.6 (*)    MCHC 36.3 (*)    RDW 16.7 (*)    Platelets 432 (*)    Neutro Abs 11.0 (*)    Monocytes Absolute 1.5 (*)    All other components within normal limits  COMPREHENSIVE METABOLIC PANEL - Abnormal; Notable for the following:    Total Bilirubin 2.9 (*)    All other components within normal limits  RETICULOCYTES - Abnormal; Notable for the following:    Retic Ct Pct 16.2 (*)    RBC. 2.63 (*)    Retic Count, Manual 426.1 (*)    All other components within normal limits  URINALYSIS, ROUTINE W REFLEX MICROSCOPIC  I-STAT TROPOININ, ED  POC URINE PREG, ED    Imaging Review Dg Chest 2 View  01/13/2014   CLINICAL DATA:  Chest pain and  sickle cell crisis  EXAM: CHEST  2 VIEW  COMPARISON:  PA and lateral chest of November 25, 2013  FINDINGS: The lungs are adequately inflated. There is no focal infiltrate. The heart and pulmonary vascularity exhibit no acute changes. There is stable central pulmonary vascular prominence. There is no pleural effusion. There are chronic vertebral body and humeral head changes consistent with sickle cell disease.  IMPRESSION: There is no evidence of pulmonary  edema or alveolar pneumonia. There are mild stable changes consistent with the sequelae of sickle cell disease.   Electronically Signed   By: David  Martinique   On: 01/13/2014 13:27     EKG Interpretation   Date/Time:  Monday January 13 2014 11:35:10 EDT Ventricular Rate:  89 PR Interval:  146 QRS Duration: 81 QT Interval:  374 QTC Calculation: 455 R Axis:   68 Text Interpretation:  Sinus rhythm since last tracing no significant  change Confirmed by BELFI  MD, MELANIE ([id number]) on 01/13/2014 4:30:46 PM      MDM   Final diagnoses:  Sickle cell crisis   Patient is a 23 y.o. Female who presents to the ED with chest pain, waist pain, and back pain.  Physical exam is essentially unremarkable.  Patient was treated here with 1 mg IM diluadid, 2 mg IV dilaudid, benadryl for itching, and zofran with little relief of symptoms.  1 additional mg dilaudid was ordered prior to me signing patient out with Domenic Moras PA-C.  CBC reveals anemia which appears to be at baseline from prior hospitalizations, and mild leukocytosis.  CMP reveals elevation of total bilirubin.  Reticulocyte count is elevated at 16.2.  UA and urine pregnancy are currently pending.  CXR is negative.  EKG is unchanged from prior.  Istat troponin is negative.  Plan for patient is to attempt to control pain here in the ED with 4 mg total of dilaudid.  If pain is still out of control patient will likely need admission at this time.  Patient was signed out at shift change.      Cherylann Parr, PA-C 01/13/14 1725

## 2014-01-13 NOTE — Telephone Encounter (Signed)
Received patient call, patient c/o having a pain crisis, having "real bad" chest pain, rib pain, and lower back pain. Patient reports pain rating 7/10, pain started approximately 1 hr ago and 10mg  oxycodone taken with no relief. Patient reports taking increased water intake this morning.  Patient reports she is currently at work and unable to continue working due to pain. Patient denies all other pertinent ROS per [facility] Triage Assessment. Informed patient that this RN will notify provider on call and patient is to receive a return call back with recommendation. Patient acknowledges.

## 2014-01-13 NOTE — ED Notes (Signed)
Patient naked and stating she will not put her gown on because she is irritated at the fact that she has not received as much medication as she usually gets (4mg  dilaudid q 15 minutes). Patient has been informed that she is being cared for by a female provider and although we understand she irritated she must be appropriately dressed while being seen.

## 2014-01-13 NOTE — ED Notes (Signed)
Patient had to be awakened when giving pain med IM.

## 2014-01-13 NOTE — Progress Notes (Signed)
ANTIBIOTIC CONSULT NOTE - INITIAL  Pharmacy Consult for Ceftriaxone Indication: UTI  Allergies  Allergen Reactions  . Carrot Oil Hives and Swelling  . Latex Rash    Patient Measurements: Height: 5\' 2"  (157.5 cm) Weight: 176 lb (79.833 kg) IBW/kg (Calculated) : 50.1  Vital Signs: Temp: 100.6 F (38.1 C) (08/31 1823) Temp src: Oral (08/31 1823) BP: 133/68 mmHg (08/31 1823) Pulse Rate: 96 (08/31 1823) Intake/Output from previous day:   Intake/Output from this shift:    Labs:  Recent Labs  01/13/14 1345  WBC 16.3*  HGB 8.2*  PLT 432*  CREATININE 0.70   Estimated Creatinine Clearance: 108 ml/min (by C-G formula based on Cr of 0.7). No results found for this basename: VANCOTROUGH, VANCOPEAK, VANCORANDOM, GENTTROUGH, GENTPEAK, GENTRANDOM, TOBRATROUGH, TOBRAPEAK, TOBRARND, AMIKACINPEAK, AMIKACINTROU, AMIKACIN,  in the last 72 hours   Microbiology: No results found for this or any previous visit (from the past 720 hour(s)).  Medical History: Past Medical History  Diagnosis Date  . H/O: 1 miscarriage 03/22/2011    Pt reports 2 miscarriages.  [name] 01/08/2009  . Depression 01/06/2011  . GERD (gastroesophageal reflux disease) 02/17/2011  . Trichotillomania     h/o  . Blood transfusion     "lots"  . Sickle cell anemia with crisis   . Exertional dyspnea     "sometimes"  . Sickle cell anemia   . Headache(784.0)   . Migraines 11/08/11    "@ least twice/month"  . Chronic back pain     "very severe; have knot in my back; from tight muscle; take RX and exercise for it"  . Mood swings 11/08/11    "I go back and forth; real bad"  . Pregnancy   . Blood dyscrasia     SICKLE CELL     Assessment: 73 yoF admitted with sickle cell crisis.  Pharmacy consulted to dose Ceftriaxone for UTI.  Tmax: 100.6 WBC: elevated at 16.3 Renal: SCr WNL, CrCl>100 ml/min  Goal of Therapy:  Eradication of infection  Plan:  Ceftriaxone 1g IV q24h.  Hershal Coria 01/13/2014,9:01 PM

## 2014-01-13 NOTE — Telephone Encounter (Signed)
Instructed patient to report to the Emergency department now for evaluation of chest pain. Patient acknowledges.

## 2014-01-13 NOTE — ED Provider Notes (Signed)
Sign out received from provider.  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  Labs reflective of sickle cell crisis, no evidence of acute chest.  Awaits urinalysis and pregnancy test.  Will continue with pain management, may need admission if no improvement.    5:04 PM Patient is tearful, requesting 4 mg of Dilaudid at that time, states that school she normally receives at sickle cell Center. She has received a total of 4 mg of Dilaudid here in ER with minimal improvement. Plan to have patient admitted for further pain management.  5:12 PM i have consulted with [facility], Dr. Charlies Silvers who agrees to admit [name] to medical floor, team 8, under her care.  Holding order placed.    BP 111/53  Pulse 88  Temp(Src) 98.7 F (37.1 C) (Oral)  Resp 16  SpO2 96%  LMP 01/06/2014  I have reviewed nursing notes and vital signs. I personally reviewed the imaging tests through PACS system  I reviewed available ER/hospitalization records thought the EMR  Results for orders placed during the hospital encounter of 01/13/14  CBC WITH DIFFERENTIAL      Result Value Ref Range   WBC 16.3 (*) 4.0 - 10.5 K/uL   RBC 2.63 (*) 3.87 - 5.11 MIL/uL   Hemoglobin 8.2 (*) 12.0 - 15.0 g/dL   HCT 22.6 (*) 36.0 - 46.0 %   MCV 85.9  78.0 - 100.0 fL   MCH 31.2  26.0 - 34.0 pg   MCHC 36.3 (*) 30.0 - 36.0 g/dL   RDW 16.7 (*) 11.5 - 15.5 %   Platelets 432 (*) 150 - 400 K/uL   Neutrophils Relative % 67  43 - 77 %   Neutro Abs 11.0 (*) 1.7 - 7.7 K/uL   Lymphocytes Relative 20  12 - 46 %   Lymphs Abs 3.3  0.7 - 4.0 K/uL   Monocytes Relative 9  3 - 12 %   Monocytes Absolute 1.5 (*) 0.1 - 1.0 K/uL   Eosinophils Relative 3  0 - 5 %   Eosinophils Absolute 0.5  0.0 - 0.7 K/uL   Basophils Relative 1  0 - 1 %   Basophils Absolute 0.1  0.0 - 0.1 K/uL  COMPREHENSIVE METABOLIC PANEL      Result Value Ref Range   Sodium 140  137 - 147 mEq/L   Potassium 3.8  3.7 - 5.3 mEq/L   Chloride 104  96 -  112 mEq/L   CO2 24  19 - 32 mEq/L   Glucose, Bld 95  70 - 99 mg/dL   BUN 10  6 - 23 mg/dL   Creatinine, Ser 0.70  0.50 - 1.10 mg/dL   Calcium 9.0  8.4 - 10.5 mg/dL   Total Protein 6.8  6.0 - 8.3 g/dL   Albumin 3.9  3.5 - 5.2 g/dL   AST 17  0 - 37 U/L   ALT 13  0 - 35 U/L   Alkaline Phosphatase 64  39 - 117 U/L   Total Bilirubin 2.9 (*) 0.3 - 1.2 mg/dL   GFR calc non Af Amer >90  >90 mL/min   GFR calc Af Amer >90  >90 mL/min   Anion gap 12  5 - 15  RETICULOCYTES      Result Value Ref Range   Retic Ct Pct 16.2 (*) 0.4 - 3.1 %   RBC. 2.63 (*) 3.87 - 5.11 MIL/uL   Retic Count, Manual 426.1 (*) 19.0 - 186.0  K/uL  I-STAT TROPOININ, ED      Result Value Ref Range   Troponin i, poc 0.00  0.00 - 0.08 ng/mL   Comment 3           POC URINE PREG, ED      Result Value Ref Range   Preg Test, Ur NEGATIVE  NEGATIVE   Dg Chest 2 View  01/13/2014   CLINICAL DATA:  Chest pain and sickle cell crisis  EXAM: CHEST  2 VIEW  COMPARISON:  PA and lateral chest of November 25, 2013  FINDINGS: The lungs are adequately inflated. There is no focal infiltrate. The heart and pulmonary vascularity exhibit no acute changes. There is stable central pulmonary vascular prominence. There is no pleural effusion. There are chronic vertebral body and humeral head changes consistent with sickle cell disease.  IMPRESSION: There is no evidence of pulmonary edema or alveolar pneumonia. There are mild stable changes consistent with the sequelae of sickle cell disease.   Electronically Signed   By: David  Martinique   On: 01/13/2014 13:27      Domenic Moras, PA-C 01/13/14 1712

## 2014-01-13 NOTE — ED Notes (Signed)
Patient upset about amount of pain medications and how they are order. Her orders and next due have been explained to her. EDP.

## 2014-01-13 NOTE — ED Notes (Addendum)
Pt. Stated that she does not want her BP in her arm because she is in a crisis. She wants her BP taken in her leg. Pt. Is not able to use the restroom at this time, but is aware that we need a urine specimen. Pt. States that she want pain medicine then she will be able to  give a urine specimen. Nurse is made aware.

## 2014-01-13 NOTE — ED Notes (Signed)
Pt c/o sickle cell pain and left sided chest pain that started about an hour ago. Pt denies shob, weakness, n/v, or sweating with the pain.  Pt spoke with her sickle cell doctor and was told to coem to ED and assess for chest pain. If chest pain is related to sickle cell pain then to be transferred to sickle cell clinic.

## 2014-01-14 MED ORDER — HEPARIN SODIUM (PORCINE) 5000 UNIT/ML IJ SOLN
5000.0000 [IU] | Freq: Three times a day (TID) | INTRAMUSCULAR | Status: DC
  Administered 2014-01-14 – 2014-01-15 (×4): 5000 [IU] via SUBCUTANEOUS
  Filled 2014-01-14 (×8): qty 1

## 2014-01-14 MED ORDER — NALOXONE HCL 0.4 MG/ML IJ SOLN: 0.4000 mg | INTRAMUSCULAR | Status: DC | PRN

## 2014-01-14 MED ORDER — DEXTROSE-NACL 5-0.45 % IV SOLN
INTRAVENOUS | Status: DC
  Administered 2014-01-15: 06:00:00 via INTRAVENOUS

## 2014-01-14 MED ORDER — HYDROMORPHONE HCL PF 1 MG/ML IJ SOLN
1.0000 mg | Freq: Once | INTRAMUSCULAR | Status: AC
  Administered 2014-01-14: 1 mg via INTRAVENOUS
  Filled 2014-01-14: qty 1

## 2014-01-14 MED ORDER — SODIUM CHLORIDE 0.9 % IJ SOLN: 9.0000 mL | INTRAMUSCULAR | Status: DC | PRN

## 2014-01-14 MED ORDER — SENNOSIDES-DOCUSATE SODIUM 8.6-50 MG PO TABS
1.0000 | ORAL_TABLET | Freq: Every day | ORAL | Status: DC
  Administered 2014-01-14 – 2014-01-15 (×2): 1 via ORAL
  Filled 2014-01-14 (×4): qty 1

## 2014-01-14 MED ORDER — KETOROLAC TROMETHAMINE 15 MG/ML IJ SOLN
15.0000 mg | Freq: Four times a day (QID) | INTRAMUSCULAR | Status: DC
  Administered 2014-01-14 – 2014-01-16 (×8): 15 mg via INTRAVENOUS
  Filled 2014-01-14 (×14): qty 1

## 2014-01-14 MED ORDER — DIPHENHYDRAMINE HCL 25 MG PO CAPS
25.0000 mg | ORAL_CAPSULE | Freq: Four times a day (QID) | ORAL | Status: DC | PRN
  Administered 2014-01-14: 50 mg via ORAL
  Filled 2014-01-14: qty 2

## 2014-01-14 MED ORDER — ONDANSETRON HCL 4 MG/2ML IJ SOLN: 4.0000 mg | Freq: Four times a day (QID) | INTRAMUSCULAR | Status: DC | PRN

## 2014-01-14 MED ORDER — HYDROMORPHONE 2 MG/ML HIGH CONCENTRATION IV PCA SOLN
INTRAVENOUS | Status: DC
  Administered 2014-01-14: 3.2 mg via INTRAVENOUS
  Administered 2014-01-14: 6.5 mg via INTRAVENOUS
  Administered 2014-01-14: 13:00:00 via INTRAVENOUS
  Administered 2014-01-14: 11.5 mg via INTRAVENOUS
  Administered 2014-01-15: 3 mg via INTRAVENOUS
  Administered 2014-01-15: 3.5 mg via INTRAVENOUS
  Administered 2014-01-15: 0.5 mg via INTRAVENOUS
  Administered 2014-01-15: 2.5 mg via INTRAVENOUS
  Filled 2014-01-14: qty 25

## 2014-01-14 NOTE — Care Management Note (Signed)
CARE MANAGEMENT NOTE 01/14/2014  Patient:  [name], [name]   Account Number:  [id number]  Date Initiated:  01/14/2014  Documentation initiated by:  Leafy Kindle  Subjective/Objective Assessment:   23 yo admitted with [facility] and UTI     Action/Plan:   From home alone   Anticipated DC Date:  01/18/2014   Anticipated DC Plan:  [place]  CM consult      Choice offered to / List presented to:             Status of service:  In process, will continue to follow Medicare Important Message given?   (If response is "NO", the following Medicare IM given date fields will be blank) Date Medicare IM given:   Medicare IM given by:   Date Additional Medicare IM given:   Additional Medicare IM given by:    Discharge Disposition:    Per UR Regulation:  Reviewed for med. necessity/level of care/duration of stay  If discussed at [place] of Stay Meetings, dates discussed:    Comments:  01/14/14 Marney Doctor RN,BSN,NCM Pt on PCA Dil and IV ABX.  Wil follow for CM needs.

## 2014-01-14 NOTE — Progress Notes (Signed)
Turned off pulse ox on machine due to patient kept removing probe from finger. Had replaced 3 times in last hour due to patient pulling it off.

## 2014-01-14 NOTE — Progress Notes (Signed)
Patient ID: [name], female   DOB: 10/15/1990, 23 y.o.   MRN: [id number] SICKLE CELL SERVICE PROGRESS NOTE  [name] XTA:[id number] DOB: 08/30/1990 DOA: 01/13/2014 PCP: MATTHEWS,MICHELLE A., MD   Presenting HPI: Patient is a 23 y.o. 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 Pt describes pain as constant and 10/10 in severity with no specific alleviating or aggravating factors. Pt has denies fevers, chills, no specific urinary concerns, she has called her PCP earlier and was advised to come to the hospital for pain control.  In ED, BP was 138/108, HR 99, RR 20, T max 100.6 F and oxygen saturation 94% on room air. WBC count was elevated at 16.3, hemoglobin was 8.2. Urinalysis revealed small leukocytes. She was started on empiric rocephin on admission.    Consultants:  none  Procedures:  none  Antibiotics:  Ceftriaxone day 2  HPI/Subjective: Pt complains of an 8/10 pain in her left arm, back, abdomen, and left side of neck. Pain started yesterday. She tried taking her home oxycodone but found no relief. She denies HA, blurry vision, cough, SOB, and phlegm. She feels that the PCA has helped some but doesn't find the relief adequate. Her appetite is minimal. She is still using the bedside commode as she is having a hard time walking to the bathroom due to pain. Last BM was yesterday.   Objective: Filed Vitals:   01/14/14 1011 01/14/14 1200 01/14/14 1257 01/14/14 1439  BP: 102/63   120/67  Pulse: 96   103  Temp: 99 F (37.2 C)   98.9 F (37.2 C)  TempSrc: Oral   Oral  Resp: 20 18 17 18   Height:      Weight:      SpO2: 93% 96% 92% 96%   Weight change:   Intake/Output Summary (Last 24 hours) at 01/14/14 1743 Last data filed at 01/14/14 1445  Gross per 24 hour  Intake 2357.51 ml   Output   3100 ml  Net -742.49 ml    General: Alert, awake, oriented x3, in no acute distress.  HEENT: [place]/AT PEERL, EOMI Neck: Trachea midline,  no masses, no thyromegal,y no JVD, no carotid bruit OROPHARYNX:  Moist, No exudate/ erythema/lesions.  Heart: Regular rate and rhythm, without murmurs, rubs, gallops. Lungs: Clear to auscultation but unable to take deep breath due to CP, no wheezing or rhonchi noted. No increased vocal fremitus resonant to percussion  Abdomen: Soft, nontender, nondistended, positive bowel sounds, no masses no hepatosplenomegaly noted..  Neuro: No focal neurological deficits noted cranial nerves II through XII grossly intact. Strength 5 out of 5 in bilateral upper and lower extremities.  Musculoskeletal: No warm swelling or erythema around joints, no spinal tenderness noted. ROM of left arm is limited to 90 degrees abduction due to pain. Psychiatric: Patient alert and oriented x3, good insight and cognition, good recent to remote recall. Lymph node survey: No cervical axillary or inguinal lymphadenopathy noted.   Data Reviewed: Basic Metabolic Panel:  Recent Labs Lab 01/13/14 1345  NA 140  K 3.8  CL 104  CO2 24  GLUCOSE 95  BUN 10  CREATININE 0.70  CALCIUM 9.0  MG 2.0   Liver Function Tests:  Recent Labs Lab 01/13/14 1345  AST 17  ALT 13  ALKPHOS 64  BILITOT 2.9*  PROT 6.8  ALBUMIN 3.9   No results found for this basename: LIPASE, AMYLASE,  in the last 168 hours No results found for this basename: AMMONIA,  in the last 168 hours CBC:  Recent Labs Lab 01/13/14 1345  WBC 16.3*  NEUTROABS 11.0*  HGB 8.2*  HCT 22.6*  MCV 85.9  PLT 432*    Studies: Dg Chest 2 View  01/13/2014   CLINICAL DATA:  Chest pain and sickle cell crisis  EXAM: CHEST  2 VIEW  COMPARISON:  PA and lateral chest of November 25, 2013  FINDINGS: The lungs are adequately inflated. There is no focal infiltrate. The heart and pulmonary vascularity exhibit no acute changes.  There is stable central pulmonary vascular prominence. There is no pleural effusion. There are chronic vertebral body and humeral head changes consistent with sickle cell disease.  IMPRESSION: There is no evidence of pulmonary edema or alveolar pneumonia. There are mild stable changes consistent with the sequelae of sickle cell disease.   Electronically Signed   By: David  Martinique   On: 01/13/2014 13:27    Scheduled Meds: . folic acid  1 mg Oral Daily  . HYDROmorphone PCA 2 mg/mL   Intravenous 6 times per day  . hydroxyurea  1,500 mg Oral Daily  . ketorolac  15 mg Intravenous 4 times per day  . methadone  5 mg Oral QHS  . methocarbamol  750 mg Oral QODAY   Continuous Infusions: . dextrose 5 % and 0.45% NaCl      Active Problems:   Sickle cell crisis   Assessment/Plan: Active Problems:   Sickle cell crisis  1. Sickle Cell Crisis: Pt still complains of pain without adequate relief and limitations to her function. Will increase Dilaudid PCA by 20% to 0.5mg  q54min. Stop Oxycodone and PRN Dilaudid. Start Toradol 15mg  q6h. Continue Robaxin and Methadone for long acting control. 2. Sickle Cell Care: Continue Folic acid and hydroxyurea 3. Presumed PNA: Pt had leukocytosis, slight fever and hypoxia upon presentation. Denies any respiratory symptoms. She was started on ceftriaxone for presumed pneumonia. She has been afebrile since presentation and leukocytosis and fever could be explained by crisis. She is reluctant to take a deep breath due to pain which may be attributing to her hypoxia. Will stop Ceftriaxone and monitor for improvement.    4. FEN/GI : Regular Diet  IV fluids switched to D5/0.45% @ 50cc/hr Bowel regimen in place  Code Status: full DVT Prophylaxis: heparin Family Communication: none Disposition Plan: pending improvement in pain and function.  Kalman Shan  Pager [phone]. If 7PM-7AM, please contact night-coverage.  01/14/2014, 5:43 PM  LOS: 1 day   Kalman Shan

## 2014-01-14 NOTE — Progress Notes (Signed)
Pt was sitting up looking for cell phone when I arrived. Pt said she was in pain. Had brief visit. Pt remembered our past visits. Will visit again in a couple days if pt is still here. pls call if additional spt is needed. Ernest Haber Chaplain  01/14/14 0900  Clinical Encounter Type  Visited With Patient

## 2014-01-14 NOTE — ED Provider Notes (Signed)
Medical screening examination/treatment/procedure(s) were performed by non-physician practitioner and as supervising physician I was immediately available for consultation/collaboration.   EKG Interpretation   Date/Time:  Monday January 13 2014 11:35:10 EDT Ventricular Rate:  89 PR Interval:  146 QRS Duration: 81 QT Interval:  374 QTC Calculation: 455 R Axis:   68 Text Interpretation:  Sinus rhythm since last tracing no significant  change Confirmed by Dwayne Begay  MD, Jordell Outten ([id number]) on 01/13/2014 4:30:46 PM        Malvin Johns, MD 01/14/14 [phone]

## 2014-01-14 NOTE — Plan of Care (Signed)
Problem: Phase I Progression Outcomes Goal: Pain controlled with appropriate interventions Outcome: Progressing On dilaudid PCA with dilaudid 1 mg q4h prn. Patient requested additional medication around 2315. Spoke to NP on Engineer, technical sales. Orders received. But patient snoring when went to room to administer extra medication. Will hold for now.

## 2014-01-14 NOTE — ED Provider Notes (Signed)
Medical screening examination/treatment/procedure(s) were performed by non-physician practitioner and as supervising physician I was immediately available for consultation/collaboration.  Richarda Blade, MD 01/14/14 0111

## 2014-01-14 NOTE — Progress Notes (Signed)
Patient oob using BSC. C/o pain 8/10 in chest/left arm/back. Gave additional dilaudid as previously ordered. With pain decreasing.

## 2014-01-15 DIAGNOSIS — D5700 Hb-SS disease with crisis, unspecified: Principal | ICD-10-CM

## 2014-01-15 DIAGNOSIS — D72829 Elevated white blood cell count, unspecified: Secondary | ICD-10-CM

## 2014-01-15 LAB — CBC WITH DIFFERENTIAL/PLATELET
BASOS ABS: 0.1 10*3/uL (ref 0.0–0.1)
BASOS PCT: 0 % (ref 0–1)
EOS ABS: 0.5 10*3/uL (ref 0.0–0.7)
EOS PCT: 3 % (ref 0–5)
HCT: 21.7 % — ABNORMAL LOW (ref 36.0–46.0)
Hemoglobin: 7.6 g/dL — ABNORMAL LOW (ref 12.0–15.0)
LYMPHS ABS: 3.5 10*3/uL (ref 0.7–4.0)
Lymphocytes Relative: 24 % (ref 12–46)
MCH: 30.5 pg (ref 26.0–34.0)
MCHC: 35 g/dL (ref 30.0–36.0)
MCV: 87.1 fL (ref 78.0–100.0)
Monocytes Absolute: 1.3 10*3/uL — ABNORMAL HIGH (ref 0.1–1.0)
Monocytes Relative: 9 % (ref 3–12)
Neutro Abs: 9.3 10*3/uL — ABNORMAL HIGH (ref 1.7–7.7)
Neutrophils Relative %: 64 % (ref 43–77)
Platelets: 438 10*3/uL — ABNORMAL HIGH (ref 150–400)
RBC: 2.49 MIL/uL — ABNORMAL LOW (ref 3.87–5.11)
RDW: 17.6 % — AB (ref 11.5–15.5)
WBC: 14.6 10*3/uL — ABNORMAL HIGH (ref 4.0–10.5)

## 2014-01-15 LAB — URINE CULTURE: SPECIAL REQUESTS: NORMAL

## 2014-01-15 MED ORDER — HYDROMORPHONE 2 MG/ML HIGH CONCENTRATION IV PCA SOLN
INTRAVENOUS | Status: DC
Start: 2014-01-16 — End: 2014-01-16
  Administered 2014-01-16: 2.63 mg via INTRAVENOUS

## 2014-01-15 MED ORDER — OXYCODONE HCL 5 MG PO TABS
15.0000 mg | ORAL_TABLET | ORAL | Status: DC
  Administered 2014-01-15 – 2014-01-16 (×5): 15 mg via ORAL
  Filled 2014-01-15 (×5): qty 3

## 2014-01-15 MED ORDER — HYDROMORPHONE 2 MG/ML HIGH CONCENTRATION IV PCA SOLN: INTRAVENOUS | Status: DC

## 2014-01-15 MED ORDER — HYDROMORPHONE HCL PF 1 MG/ML IJ SOLN
1.0000 mg | Freq: Once | INTRAMUSCULAR | Status: AC
  Administered 2014-01-15: 1 mg via INTRAVENOUS
  Filled 2014-01-15: qty 1

## 2014-01-15 MED ORDER — HYDROMORPHONE 2 MG/ML HIGH CONCENTRATION IV PCA SOLN
INTRAVENOUS | Status: AC
  Administered 2014-01-16: 4.88 mg via INTRAVENOUS
  Administered 2014-01-16: 4.28 mg via INTRAVENOUS
  Filled 2014-01-15: qty 25

## 2014-01-15 NOTE — Progress Notes (Signed)
SICKLE CELL SERVICE PROGRESS NOTE  [name] YPP:[id number] DOB: 03/02/1991 DOA: 01/13/2014 PCP: MATTHEWS,MICHELLE A., MD  Assessment/Plan: Active Problems:   Sickle cell crisis  1. Hb SS with Crisis: Pt doing better. Has not maximized her use on the PCA but feels that she is not obtaining adequate relief overnight. Will adjust PCA to give a basal rate of 0.5 mg/hr overnight and schedule her oral medication with PCA to be used on a prn basis. Continue Methadone and Toradol. 2. Leukocytosis: No evidence of infection. Likely associated with crisis. Will continue to monitor. 3. Constipation: Pt still has not had a BM since admission. Will continue Senna-S if no BM by this evening will consider enema. 4. Sickle Cell Disease: Pt on Hydrea but MCV, WBC and platelet count does not reflect compliance. Continue Hydrea and Folic Acid.  Code Status: Full Code Family Communication: N/A Disposition Plan: Not yet ready for discharge. Likely discharge tomorrow.  MATTHEWS,MICHELLE A.  Pager [phone]. If 7PM-7AM, please contact night-coverage.  01/15/2014, 1:29 PM  LOS: 2 days   Brief narrative: Patient is a 23 y.o. Female with sickle cell disease who presented to [facility] ED with main concern of one day duration of progressively worsening chest discomfort typical of her sickle cell pain, also associated with back pain, arms pain, waist pain. Pt describes pain as constant and 10/10 in severity with no specific alleviating or aggravating factors. Pt has denies fevers, chills, no specific urinary concerns, she has called her PCP earlier and was advised to come to the hospital for pain control.  In ED, BP was 138/108, HR 99, RR 20, T max 100.6 F and oxygen saturation 94% on room air. WBC count was elevated at 16.3, hemoglobin was 8.2. Urinalysis revealed small leukocytes. She was started on empiric rocephin on  admission.   Consultants:  None  Procedures:  None  Antibiotics:  None  HPI/Subjective: Pt reports that pain is improved and is now just localized to waist and left shoulder. She rates pain intensity as 7/10 compared to her baseline of 4-5/10.   Objective: Filed Vitals:   01/15/14 0714 01/15/14 0800 01/15/14 1033 01/15/14 1209  BP: 98/61  111/69   Pulse: 82  88   Temp: 98.6 F (37 C)  99.2 F (37.3 C)   TempSrc: Oral  Oral   Resp: 20 15 20 13   Height:      Weight: 179 lb 3.7 oz (81.3 kg)     SpO2: 95% 94% 97% 93%   Weight change:   Intake/Output Summary (Last 24 hours) at 01/15/14 1329 Last data filed at 01/15/14 0715  Gross per 24 hour  Intake   3730 ml  Output   1700 ml  Net   2030 ml    General: Alert, awake, oriented x3, in no acute distress.  HEENT: [place]/AT PEERL, EOMI, anicteric Neck: Trachea midline,  no masses, no thyromegal,y no JVD, no carotid bruit OROPHARYNX:  Moist, No exudate/ erythema/lesions.  Heart: Regular rate and rhythm, without murmurs, rubs, gallops, PMI non-displaced, no heaves or thrills on palpation.  Lungs: Clear to auscultation, no wheezing or rhonchi noted.  Abdomen: Soft, nontender, nondistended, positive bowel sounds, no masses no hepatosplenomegaly noted.  Neuro: No focal neurological deficits noted cranial nerves II through XII grossly intact.Strength normal in bilateral upper and lower extremities. Musculoskeletal: No warm swelling or erythema around joints, no spinal tenderness noted. Psychiatric: Patient alert and oriented x3, good insight and cognition, good recent to remote recall.   Data  Reviewed: Basic Metabolic Panel:  Recent Labs Lab 01/13/14 1345  NA 140  K 3.8  CL 104  CO2 24  GLUCOSE 95  BUN 10  CREATININE 0.70  CALCIUM 9.0  MG 2.0   Liver Function Tests:  Recent Labs Lab 01/13/14 1345  AST 17  ALT 13  ALKPHOS 64  BILITOT 2.9*  PROT 6.8  ALBUMIN 3.9   No results found for this basename: LIPASE,  AMYLASE,  in the last 168 hours No results found for this basename: AMMONIA,  in the last 168 hours CBC:  Recent Labs Lab 01/13/14 1345 01/15/14 0426  WBC 16.3* 14.6*  NEUTROABS 11.0* 9.3*  HGB 8.2* 7.6*  HCT 22.6* 21.7*  MCV 85.9 87.1  PLT 432* 438*    Recent Results (from the past 240 hour(s))  URINE CULTURE     Status: None   Collection Time    01/13/14  4:40 PM      Result Value Ref Range Status   Specimen Description URINE, RANDOM   Final   Special Requests Normal   Final   Culture  Setup Time     Final   Value: 01/14/2014 04:37     Performed at [place]     Final   Value: 75,000 COLONIES/ML     Performed at [facility]   Culture     Final   Value: Multiple bacterial morphotypes present, none predominant. Suggest appropriate recollection if clinically indicated.     Performed at [facility]   Report Status 01/15/2014 FINAL   Final     Studies: Dg Chest 2 View  01/13/2014   CLINICAL DATA:  Chest pain and sickle cell crisis  EXAM: CHEST  2 VIEW  COMPARISON:  PA and lateral chest of November 25, 2013  FINDINGS: The lungs are adequately inflated. There is no focal infiltrate. The heart and pulmonary vascularity exhibit no acute changes. There is stable central pulmonary vascular prominence. There is no pleural effusion. There are chronic vertebral body and humeral head changes consistent with sickle cell disease.  IMPRESSION: There is no evidence of pulmonary edema or alveolar pneumonia. There are mild stable changes consistent with the sequelae of sickle cell disease.   Electronically Signed   By: David  Martinique   On: 01/13/2014 13:27    Scheduled Meds: . folic acid  1 mg Oral Daily  . heparin subcutaneous  5,000 Units Subcutaneous 3 times per day  . HYDROmorphone PCA 2 mg/mL   Intravenous 6 times per day  . hydroxyurea  1,500 mg Oral Daily  . ketorolac  15 mg Intravenous 4 times per day  . methadone  5 mg Oral QHS  .  methocarbamol  750 mg Oral QODAY  . oxyCODONE  15 mg Oral Q4H  . senna-docusate  1 tablet Oral QHS   Continuous Infusions: . dextrose 5 % and 0.45% NaCl 50 mL/hr at 01/15/14 0600    Time spent 35 minutes.

## 2014-01-16 LAB — CBC WITH DIFFERENTIAL/PLATELET
BASOS ABS: 0.1 10*3/uL (ref 0.0–0.1)
Basophils Relative: 0 % (ref 0–1)
EOS PCT: 3 % (ref 0–5)
Eosinophils Absolute: 0.5 10*3/uL (ref 0.0–0.7)
HCT: 22.3 % — ABNORMAL LOW (ref 36.0–46.0)
Hemoglobin: 7.7 g/dL — ABNORMAL LOW (ref 12.0–15.0)
LYMPHS ABS: 3.1 10*3/uL (ref 0.7–4.0)
LYMPHS PCT: 19 % (ref 12–46)
MCH: 30.8 pg (ref 26.0–34.0)
MCHC: 34.5 g/dL (ref 30.0–36.0)
MCV: 89.2 fL (ref 78.0–100.0)
Monocytes Absolute: 1.7 10*3/uL — ABNORMAL HIGH (ref 0.1–1.0)
Monocytes Relative: 10 % (ref 3–12)
NEUTROS ABS: 11.3 10*3/uL — AB (ref 1.7–7.7)
NEUTROS PCT: 68 % (ref 43–77)
Platelets: 481 10*3/uL — ABNORMAL HIGH (ref 150–400)
RBC: 2.5 MIL/uL — AB (ref 3.87–5.11)
RDW: 17.2 % — AB (ref 11.5–15.5)
WBC: 16.6 10*3/uL — ABNORMAL HIGH (ref 4.0–10.5)

## 2014-01-16 LAB — COMPREHENSIVE METABOLIC PANEL
ALK PHOS: 52 U/L (ref 39–117)
ALT: 21 U/L (ref 0–35)
AST: 24 U/L (ref 0–37)
Albumin: 4.2 g/dL (ref 3.5–5.2)
Anion gap: 17 — ABNORMAL HIGH (ref 5–15)
BUN: 6 mg/dL (ref 6–23)
CO2: 22 meq/L (ref 19–32)
Calcium: 9.4 mg/dL (ref 8.4–10.5)
Chloride: 100 mEq/L (ref 96–112)
Creatinine, Ser: 0.66 mg/dL (ref 0.50–1.10)
GFR calc Af Amer: >90 mL/min (ref >90)
GLUCOSE: 110 mg/dL — AB (ref 70–99)
POTASSIUM: 4.2 meq/L (ref 3.7–5.3)
SODIUM: 139 meq/L (ref 137–147)
Total Bilirubin: 3.2 mg/dL — ABNORMAL HIGH (ref 0.3–1.2)
Total Protein: 7.7 g/dL (ref 6.0–8.3)

## 2014-01-16 NOTE — Discharge Summary (Signed)
Physician Discharge Summary  [name] FBP:[id number] DOB: [birth year] DOA: 01/13/2014  PCP: MATTHEWS,MICHELLE A., MD  Admit date: 01/13/2014 Discharge date: 01/16/2014  Discharge Diagnoses:  Active Problems:   Sickle cell crisis   Discharge Condition: Stable  Disposition:  Follow-up Information   Follow up with MATTHEWS,MICHELLE A., MD In 2 weeks.   Specialty:  Internal Medicine   Contact information:   [place] [address] [phone]       Diet:regular  Wt Readings from Last 3 Encounters:  01/16/14 180 lb 3.2 oz (81.738 kg)  11/28/13 179 lb 1.6 oz (81.239 kg)  10/28/13 161 lb (73.029 kg)    History of present illness:  Patient is a 23 y.o. Female with sickle cell disease who presented to [facility] ED with main concern of one day duration of progressively worsening chest discomfort typical of her sickle cell pain, also associated with back pain, arms pain, waist pain. Pt describes pain as constant and 10/10 in severity with no specific alleviating or aggravating factors. Pt has denies fevers, chills, no specific urinary concerns, she has called her PCP earlier and was advised to come to the hospital for pain control.  In ED, BP was 138/108, HR 99, RR 20, T max 100.6 F and oxygen saturation 94% on room air. WBC count was elevated at 16.3, hemoglobin was 8.2. Urinalysis revealed small leukocytes. She was started on empiric rocephin on admission.    Hospital Course:  Sickle Cell Crisis: Patient presented with pain characteristic of acute vaso-occlusive crisis.Pt's pain was treated with bolus IV analgesics initially and was later transitioned with a Dilaudid PCA and ketorolac. She was continued on her home dose of Methadone and Robaxin. PCA was decreased as pain improved and she was started on her home dose of Oxycodone. Her pain was well controlled with her oral home regimen without much need for PCA. She had overall improvement of her pain and was physically  functional upon discharge. Her Hgb remained around her baseline during her stay. On admission there was concern for pneumonia due to fever, hypoxia, and leukocytosis. She was started on Ceftriaxone IV. Ceftriaxone was discontinued the following day as it was likely her VOC causing findings and pt was monitored clinically. She also reported no cough, phlegm, and CXR was negative. She was eating well and reported two bowel movements the night before discharge.  Discharge Exam:  Filed Vitals:   01/16/14 0756  BP:   Pulse:   Temp:   Resp: 17   Filed Vitals:   01/16/14 0147 01/16/14 0420 01/16/14 0556 01/16/14 0756  BP: 119/76  117/62   Pulse: 90  85   Temp: 99.1 F (37.3 C)  99.1 F (37.3 C)   TempSrc: Oral  Oral   Resp: 16 14 14 17   Height:      Weight:   180 lb 3.2 oz (81.738 kg)   SpO2: 97%  94%    General: Alert, awake, oriented x3, in no acute distress.  HEENT: [place]/AT PEERL, EOMI  Neck: Trachea midline, no masses, no thyromegal,y no JVD, no carotid bruit  OROPHARYNX: Moist, No exudate/ erythema/lesions.  Heart: Regular rate and rhythm, without murmurs, rubs, gallops.  Lungs: Clear to auscultation but unable to take deep breath due to CP, no wheezing or rhonchi noted. No increased vocal fremitus resonant to percussion  Abdomen: Soft, nontender, nondistended, positive bowel sounds, no masses no hepatosplenomegaly noted..  Neuro: No focal neurological deficits noted cranial nerves II through  XII grossly intact. Strength 5 out of 5 in bilateral upper and lower extremities.  Musculoskeletal: No warm swelling or erythema around joints, no spinal tenderness noted. ROM of left arm is limited to 90 degrees abduction due to pain.  Psychiatric: Patient alert and oriented x3, good insight and cognition, good recent to remote recall.  Lymph node survey: No cervical axillary or inguinal lymphadenopathy noted.  Discharge Instructions Follow-up with PCP in 1-2 weeks.    Medication List          folic acid 1 MG tablet  Commonly known as:  FOLVITE  Take 1 mg by mouth daily.     hydroxyurea 500 MG capsule  Commonly known as:  HYDREA  Take 1,500 mg by mouth daily. May take with food to minimize GI side effects.     methadone 5 MG tablet  Commonly known as:  DOLOPHINE  Take 5 mg by mouth at bedtime.     methocarbamol 750 MG tablet  Commonly known as:  ROBAXIN  Take 750 mg by mouth every other day.     Oxycodone HCl 10 MG Tabs  Take 15 mg by mouth every 6 (six) hours as needed (moderate pain).          The results of significant diagnostics from this hospitalization (including imaging, microbiology, ancillary and laboratory) are listed below for reference.    Significant Diagnostic Studies: Dg Chest 2 View  01/13/2014   CLINICAL DATA:  Chest pain and sickle cell crisis  EXAM: CHEST  2 VIEW  COMPARISON:  PA and lateral chest of November 25, 2013  FINDINGS: The lungs are adequately inflated. There is no focal infiltrate. The heart and pulmonary vascularity exhibit no acute changes. There is stable central pulmonary vascular prominence. There is no pleural effusion. There are chronic vertebral body and humeral head changes consistent with sickle cell disease.  IMPRESSION: There is no evidence of pulmonary edema or alveolar pneumonia. There are mild stable changes consistent with the sequelae of sickle cell disease.   Electronically Signed   By: David  Martinique   On: 01/13/2014 13:27    Microbiology: Recent Results (from the past 240 hour(s))  URINE CULTURE     Status: None   Collection Time    01/13/14  4:40 PM      Result Value Ref Range Status   Specimen Description URINE, RANDOM   Final   Special Requests Normal   Final   Culture  Setup Time     Final   Value: 01/14/2014 04:37     Performed at [facility]     Final   Value: 75,000 COLONIES/ML     Performed at [facility]   Culture     Final   Value: Multiple bacterial morphotypes  present, none predominant. Suggest appropriate recollection if clinically indicated.     Performed at [facility]   Report Status 01/15/2014 FINAL   Final     Labs: Basic Metabolic Panel:  Recent Labs Lab 01/13/14 1345 01/16/14 0500  NA 140 139  K 3.8 4.2  CL 104 100  CO2 24 22  GLUCOSE 95 110*  BUN 10 6  CREATININE 0.70 0.66  CALCIUM 9.0 9.4  MG 2.0  --    Liver Function Tests:  Recent Labs Lab 01/13/14 1345 01/16/14 0500  AST 17 24  ALT 13 21  ALKPHOS 64 52  BILITOT 2.9* 3.2*  PROT 6.8 7.7  ALBUMIN 3.9 4.2  Recent Labs Lab 01/13/14 1345 01/15/14 0426 01/16/14 0500  WBC 16.3* 14.6* 16.6*  NEUTROABS 11.0* 9.3* 11.3*  HGB 8.2* 7.6* 7.7*  HCT 22.6* 21.7* 22.3*  MCV 85.9 87.1 89.2  PLT 432* 438* 481*    Time coordinating discharge: <30 min.  SignedKalman Shan  01/16/2014, 9:05 AM

## 2014-01-16 NOTE — Progress Notes (Signed)
Pt discharged home in stable condition. Discharge instructions and note for work given. Pt verbalized understanding.

## 2014-01-16 NOTE — Progress Notes (Signed)
Patient Controlled Analgesia 24 hour Summary  Total mg delivered: 24.8mg  Number of demands: 44 Number of deliveries: 27

## 2014-01-17 ENCOUNTER — Ambulatory Visit: Payer: Medicare Other | Admitting: Family Medicine

## 2014-01-28 ENCOUNTER — Encounter (HOSPITAL_COMMUNITY): Payer: Self-pay | Admitting: Emergency Medicine

## 2014-01-28 ENCOUNTER — Inpatient Hospital Stay (HOSPITAL_COMMUNITY)
Admission: EM | Admit: 2014-01-28 | Discharge: 2014-02-01 | DRG: 812 | Disposition: A | Discharge status: Home / Self Care | Payer: Medicare Other | Attending: Internal Medicine | Admitting: Internal Medicine

## 2014-01-28 ENCOUNTER — Emergency Department (HOSPITAL_COMMUNITY): Payer: Medicare Other

## 2014-01-28 ENCOUNTER — Telehealth: Payer: Self-pay | Admitting: Internal Medicine

## 2014-01-28 DIAGNOSIS — Z79899 Other long term (current) drug therapy: Secondary | ICD-10-CM | POA: Diagnosis not present

## 2014-01-28 DIAGNOSIS — D571 Sickle-cell disease without crisis: Secondary | ICD-10-CM

## 2014-01-28 DIAGNOSIS — F411 Generalized anxiety disorder: Secondary | ICD-10-CM | POA: Diagnosis present

## 2014-01-28 DIAGNOSIS — F329 Major depressive disorder, single episode, unspecified: Secondary | ICD-10-CM | POA: Diagnosis present

## 2014-01-28 DIAGNOSIS — R079 Chest pain, unspecified: Secondary | ICD-10-CM | POA: Diagnosis not present

## 2014-01-28 DIAGNOSIS — D72829 Elevated white blood cell count, unspecified: Secondary | ICD-10-CM | POA: Diagnosis present

## 2014-01-28 DIAGNOSIS — M549 Dorsalgia, unspecified: Secondary | ICD-10-CM | POA: Diagnosis present

## 2014-01-28 DIAGNOSIS — Z0389 Encounter for observation for other suspected diseases and conditions ruled out: Secondary | ICD-10-CM | POA: Diagnosis not present

## 2014-01-28 DIAGNOSIS — K219 Gastro-esophageal reflux disease without esophagitis: Secondary | ICD-10-CM | POA: Diagnosis present

## 2014-01-28 DIAGNOSIS — D5700 Hb-SS disease with crisis, unspecified: Secondary | ICD-10-CM

## 2014-01-28 DIAGNOSIS — M25569 Pain in unspecified knee: Secondary | ICD-10-CM | POA: Diagnosis not present

## 2014-01-28 DIAGNOSIS — G8929 Other chronic pain: Secondary | ICD-10-CM | POA: Diagnosis present

## 2014-01-28 DIAGNOSIS — F3289 Other specified depressive episodes: Secondary | ICD-10-CM | POA: Diagnosis present

## 2014-01-28 DIAGNOSIS — Z87891 Personal history of nicotine dependence: Secondary | ICD-10-CM

## 2014-01-28 MED ORDER — HYDROMORPHONE HCL PF 2 MG/ML IJ SOLN
2.0000 mg | Freq: Once | INTRAMUSCULAR | Status: AC
  Administered 2014-01-29: 2 mg via INTRAVENOUS
  Filled 2014-01-28: qty 1

## 2014-01-28 MED ORDER — SODIUM CHLORIDE 0.9 % IV BOLUS (SEPSIS)
1000.0000 mL | Freq: Once | INTRAVENOUS | Status: AC
  Administered 2014-01-29: 1000 mL via INTRAVENOUS

## 2014-01-28 MED ORDER — DIPHENHYDRAMINE HCL 50 MG/ML IJ SOLN
25.0000 mg | Freq: Once | INTRAMUSCULAR | Status: AC
  Administered 2014-01-29: 25 mg via INTRAVENOUS
  Filled 2014-01-28: qty 1

## 2014-01-28 NOTE — ED Provider Notes (Signed)
CSN: [id number]     Arrival date & time 01/28/14  2205 History   First MD Initiated Contact with Patient 01/28/14 2310     Chief Complaint  Patient presents with  . Sickle Cell Pain Crisis     (Consider location/radiation/quality/duration/timing/severity/associated sxs/prior Treatment) The history is provided by the patient.  [name] is a 23 y.o. female hx of sickle cell, chronic back pain, depression here with knee pain, back pain, chest pain. Recently admitted for sickle cell crisis and d/c a week ago. Has been on her feet and working 6 days straight at [employer]. Got back from work today and noticed pain bilateral knees and then some mild chest pain. Took oxycodone with minimal relief. Denies fevers or shortness of breath.    Past Medical History  Diagnosis Date  . H/O: 1 miscarriage 03/22/2011    Pt reports 2 miscarriages.  [name] 01/08/2009  . Depression 01/06/2011  . GERD (gastroesophageal reflux disease) 02/17/2011  . Trichotillomania     h/o  . Blood transfusion     "lots"  . Sickle cell anemia with crisis   . Exertional dyspnea     "sometimes"  . Sickle cell anemia   . Headache(784.0)   . Migraines 11/08/11    "@ least twice/month"  . Chronic back pain     "very severe; have knot in my back; from tight muscle; take RX and exercise for it"  . Mood swings 11/08/11    "I go back and forth; real bad"  . Pregnancy   . Blood dyscrasia     SICKLE CELL   Past Surgical History  Procedure Laterality Date  . Cholecystectomy  05/2010  . Dilation and curettage of uterus  02/20/11    S/P miscarriage   Family History  Problem Relation Age of Onset  . Diabetes Mother   . Alcoholism    . Depression    . Hypercholesterolemia    . Hypertension    . Migraines    . Diabetes Maternal Grandmother   . Diabetes Paternal Grandmother    History  Substance Use Topics  . Smoking status: Former Smoker -- 0.25 packs/day for 1 years    Types: Cigarettes    Quit  date: 03/25/2013  . Smokeless tobacco: Never Used  . Alcohol Use: Yes     Comment: pt states she quit marijuan in May 2013. Rare ETOH, + cigarettes.  She is enrolled in school   OB History   Grav Para Term Preterm Abortions TAB SAB Ect Mult Living   2    1  1         Obstetric Comments   Miscarried in October 2012 at about 7 weeks     Review of Systems  Cardiovascular: Positive for chest pain.  Musculoskeletal:       Knee pain   All other systems reviewed and are negative.     Allergies  Carrot oil and Latex  Home Medications   Prior to Admission medications   Medication Sig Start Date End Date Taking? Authorizing Provider  hydroxyurea (HYDREA) 500 MG capsule Take 1,500 mg by mouth daily. May take with food to minimize GI side effects.   Yes Historical Provider, MD  methadone (DOLOPHINE) 5 MG tablet Take 5 mg by mouth at bedtime.   Yes Historical Provider, MD  Oxycodone HCl 10 MG TABS Take 15 mg by mouth every 6 (six) hours as needed (moderate pain).   Yes Historical Provider, MD   BP  109/57  Pulse 99  Temp(Src) 98.2 F (36.8 C) (Oral)  SpO2 100%  LMP 01/06/2014 Physical Exam  Nursing note and vitals reviewed. Constitutional: She is oriented to person, place, and time.  Uncomfortable, tearful   HENT:  Head: Normocephalic.  Mouth/Throat: Oropharynx is clear and moist.  Eyes: Conjunctivae and EOM are normal. Pupils are equal, round, and reactive to light.  Neck: Normal range of motion. Neck supple.  Cardiovascular: Normal rate, regular rhythm and normal heart sounds.   Pulmonary/Chest: Effort normal and breath sounds normal. No respiratory distress. She has no wheezes. She has no rales.  Abdominal: Soft. Bowel sounds are normal. She exhibits no distension. There is no tenderness. There is no rebound and no guarding.  Musculoskeletal: Normal range of motion.  Bilateral knees slightly swollen, dec ROM from pain. No obvious septic joint   Neurological: She is alert and  oriented to person, place, and time.  Skin: Skin is warm.  Psychiatric: She has a normal mood and affect. Her behavior is normal. Thought content normal.    ED Course  Procedures (including critical care time) Labs Review Labs Reviewed  CBC WITH DIFFERENTIAL - Abnormal; Notable for the following:    WBC 20.8 (*)    RBC 3.29 (*)    Hemoglobin 9.8 (*)    HCT 29.5 (*)    RDW 16.7 (*)    Platelets 709 (*)    Neutrophils Relative % 78 (*)    Neutro Abs 16.2 (*)    Monocytes Absolute 1.7 (*)    All other components within normal limits  RETICULOCYTES - Abnormal; Notable for the following:    Retic Ct Pct 14.6 (*)    RBC. 3.29 (*)    Retic Count, Manual 480.3 (*)    All other components within normal limits  BASIC METABOLIC PANEL  URINALYSIS, ROUTINE W REFLEX MICROSCOPIC  PREGNANCY, URINE    Imaging Review Dg Chest 2 View  01/29/2014   CLINICAL DATA:  Sickle cell crisis.  Chest pain.  EXAM: CHEST  2 VIEW  COMPARISON:  Chest radiograph performed 01/13/2014  FINDINGS: The lungs are well-aerated and clear. There is no evidence of focal opacification, pleural effusion or pneumothorax.  The heart is normal in size; the mediastinal contour is within normal limits. No acute osseous abnormalities are seen. H-shaped vertebral bodies are compatible with the patient's known sickle cell disease. Clips are noted within the right upper quadrant, reflecting prior cholecystectomy.  IMPRESSION: No acute cardiopulmonary process seen.   Electronically Signed   By: Garald Balding M.D.   On: 01/29/2014 01:12     EKG Interpretation   Date/Time:  Tuesday January 28 2014 23:01:31 EDT Ventricular Rate:  82 PR Interval:  138 QRS Duration: 90 QT Interval:  376 QTC Calculation: 439 R Axis:   63 Text Interpretation:  Sinus rhythm No significant change since last  tracing Confirmed by YAO  MD, DAVID ([id number]) on 01/28/2014 11:48:29 PM      MDM   Final diagnoses:  None    [name] is a 23  y.o. female here with knee pain, chest pain. Her typical crisis. Will get CXR, labs, reti to r/o crisis vs acute chest. Will give pain meds and reassess.   1:59 AM Patient still in pain after 2 mg dilaudid x 2. Labs at baseline. No signs of acute chest. Will admit for obs.    Wandra Arthurs, MD 01/29/14 0200

## 2014-01-28 NOTE — ED Notes (Signed)
Pt states she started having knee, back and chest pain 2 hours ago similar to her sickle cell crisis pain. Alert and oriented. Took percocet at home.

## 2014-01-29 ENCOUNTER — Inpatient Hospital Stay (HOSPITAL_COMMUNITY): Payer: Medicare Other

## 2014-01-29 ENCOUNTER — Encounter (HOSPITAL_COMMUNITY): Payer: Self-pay | Admitting: Internal Medicine

## 2014-01-29 DIAGNOSIS — Z79899 Other long term (current) drug therapy: Secondary | ICD-10-CM | POA: Diagnosis not present

## 2014-01-29 DIAGNOSIS — D5700 Hb-SS disease with crisis, unspecified: Secondary | ICD-10-CM | POA: Diagnosis not present

## 2014-01-29 DIAGNOSIS — M25569 Pain in unspecified knee: Secondary | ICD-10-CM | POA: Diagnosis not present

## 2014-01-29 DIAGNOSIS — M549 Dorsalgia, unspecified: Secondary | ICD-10-CM | POA: Diagnosis present

## 2014-01-29 DIAGNOSIS — F329 Major depressive disorder, single episode, unspecified: Secondary | ICD-10-CM | POA: Diagnosis present

## 2014-01-29 DIAGNOSIS — F3289 Other specified depressive episodes: Secondary | ICD-10-CM | POA: Diagnosis present

## 2014-01-29 DIAGNOSIS — Z87891 Personal history of nicotine dependence: Secondary | ICD-10-CM | POA: Diagnosis not present

## 2014-01-29 DIAGNOSIS — F411 Generalized anxiety disorder: Secondary | ICD-10-CM | POA: Diagnosis present

## 2014-01-29 DIAGNOSIS — R079 Chest pain, unspecified: Secondary | ICD-10-CM | POA: Diagnosis not present

## 2014-01-29 DIAGNOSIS — D72829 Elevated white blood cell count, unspecified: Secondary | ICD-10-CM | POA: Diagnosis present

## 2014-01-29 DIAGNOSIS — G8929 Other chronic pain: Secondary | ICD-10-CM | POA: Diagnosis present

## 2014-01-29 DIAGNOSIS — K219 Gastro-esophageal reflux disease without esophagitis: Secondary | ICD-10-CM | POA: Diagnosis present

## 2014-01-29 DIAGNOSIS — D571 Sickle-cell disease without crisis: Secondary | ICD-10-CM | POA: Diagnosis present

## 2014-01-29 LAB — CBC WITH DIFFERENTIAL/PLATELET
Basophils Absolute: 0 10*3/uL (ref 0.0–0.1)
Basophils Absolute: 0 10*3/uL (ref 0.0–0.1)
Basophils Relative: 0 % (ref 0–1)
Basophils Relative: 0 % (ref 0–1)
EOS ABS: 0.4 10*3/uL (ref 0.0–0.7)
EOS PCT: 1 % (ref 0–5)
Eosinophils Absolute: 0.2 10*3/uL (ref 0.0–0.7)
Eosinophils Relative: 2 % (ref 0–5)
HCT: 29.5 % — ABNORMAL LOW (ref 36.0–46.0)
HEMATOCRIT: 26.7 % — AB (ref 36.0–46.0)
HEMOGLOBIN: 9.8 g/dL — AB (ref 12.0–15.0)
Hemoglobin: 9.4 g/dL — ABNORMAL LOW (ref 12.0–15.0)
LYMPHS ABS: 2.7 10*3/uL (ref 0.7–4.0)
LYMPHS ABS: 3.9 10*3/uL (ref 0.7–4.0)
Lymphocytes Relative: 13 % (ref 12–46)
Lymphocytes Relative: 18 % (ref 12–46)
MCH: 29.8 pg (ref 26.0–34.0)
MCH: 30.6 pg (ref 26.0–34.0)
MCHC: 33.2 g/dL (ref 30.0–36.0)
MCHC: 35.2 g/dL (ref 30.0–36.0)
MCV: 89.7 fL (ref 78.0–100.0)
MCV: 87 fL (ref 78.0–100.0)
MONO ABS: 1.7 10*3/uL — AB (ref 0.1–1.0)
Monocytes Absolute: 2.8 10*3/uL — ABNORMAL HIGH (ref 0.1–1.0)
Monocytes Relative: 8 % (ref 3–12)
Monocytes Relative: 13 % — ABNORMAL HIGH (ref 3–12)
NEUTROS ABS: 14.5 10*3/uL — AB (ref 1.7–7.7)
Neutro Abs: 16.2 10*3/uL — ABNORMAL HIGH (ref 1.7–7.7)
Neutrophils Relative %: 78 % — ABNORMAL HIGH (ref 43–77)
Neutrophils Relative %: 67 % (ref 43–77)
PLATELETS: 709 10*3/uL — AB (ref 150–400)
PLATELETS: 586 10*3/uL — AB (ref 150–400)
RBC: 3.29 MIL/uL — ABNORMAL LOW (ref 3.87–5.11)
RBC: 3.07 MIL/uL — ABNORMAL LOW (ref 3.87–5.11)
RDW: 16.7 % — ABNORMAL HIGH (ref 11.5–15.5)
RDW: 16.6 % — ABNORMAL HIGH (ref 11.5–15.5)
WBC: 20.8 10*3/uL — ABNORMAL HIGH (ref 4.0–10.5)
WBC: 21.6 10*3/uL — ABNORMAL HIGH (ref 4.0–10.5)

## 2014-01-29 LAB — URINALYSIS, ROUTINE W REFLEX MICROSCOPIC
Bilirubin Urine: NEGATIVE
GLUCOSE, UA: NEGATIVE mg/dL
Hgb urine dipstick: NEGATIVE
Ketones, ur: NEGATIVE mg/dL
LEUKOCYTES UA: NEGATIVE
Nitrite: NEGATIVE
PH: 7 (ref 5.0–8.0)
Protein, ur: NEGATIVE mg/dL
Specific Gravity, Urine: 1.011 (ref 1.005–1.030)
Urobilinogen, UA: 0.2 mg/dL (ref 0.0–1.0)

## 2014-01-29 LAB — BASIC METABOLIC PANEL
Anion gap: 15 (ref 5–15)
Anion gap: 16 — ABNORMAL HIGH (ref 5–15)
BUN: 6 mg/dL (ref 6–23)
BUN: 8 mg/dL (ref 6–23)
CALCIUM: 9.5 mg/dL (ref 8.4–10.5)
CALCIUM: 8.9 mg/dL (ref 8.4–10.5)
CO2: 24 meq/L (ref 19–32)
CO2: 19 meq/L (ref 19–32)
CREATININE: 0.71 mg/dL (ref 0.50–1.10)
Chloride: 99 mEq/L (ref 96–112)
Chloride: 104 mEq/L (ref 96–112)
Creatinine, Ser: 0.7 mg/dL (ref 0.50–1.10)
GFR calc Af Amer: >90 mL/min (ref >90)
GFR calc Af Amer: >90 mL/min (ref >90)
GFR calc non Af Amer: >90 mL/min (ref >90)
GLUCOSE: 92 mg/dL (ref 70–99)
Glucose, Bld: 91 mg/dL (ref 70–99)
Potassium: 3.9 mEq/L (ref 3.7–5.3)
Potassium: 4.4 mEq/L (ref 3.7–5.3)
SODIUM: 139 meq/L (ref 137–147)
Sodium: 138 mEq/L (ref 137–147)

## 2014-01-29 LAB — RETICULOCYTES
RBC.: 3.29 MIL/uL — AB (ref 3.87–5.11)
RETIC COUNT ABSOLUTE: 480.3 10*3/uL — AB (ref 19.0–186.0)
Retic Ct Pct: 14.6 % — ABNORMAL HIGH (ref 0.4–3.1)

## 2014-01-29 LAB — PREGNANCY, URINE: Preg Test, Ur: NEGATIVE

## 2014-01-29 LAB — TROPONIN I: Troponin I: <0.3 ng/mL (ref <0.30)

## 2014-01-29 MED ORDER — FOLIC ACID 1 MG PO TABS
1.0000 mg | ORAL_TABLET | Freq: Every day | ORAL | Status: DC
  Administered 2014-01-29 – 2014-01-31 (×3): 1 mg via ORAL
  Filled 2014-01-29 (×4): qty 1

## 2014-01-29 MED ORDER — METHADONE HCL 5 MG PO TABS
5.0000 mg | ORAL_TABLET | Freq: Every day | ORAL | Status: DC
  Administered 2014-01-29 – 2014-01-31 (×3): 5 mg via ORAL
  Filled 2014-01-29 (×3): qty 1

## 2014-01-29 MED ORDER — OXYCODONE HCL 10 MG PO TABS: 15.0000 mg | ORAL_TABLET | Freq: Four times a day (QID) | ORAL | Status: DC | PRN

## 2014-01-29 MED ORDER — SODIUM CHLORIDE 0.9 % IJ SOLN: 9.0000 mL | INTRAMUSCULAR | Status: DC | PRN

## 2014-01-29 MED ORDER — ONDANSETRON HCL 4 MG/2ML IJ SOLN: 4.0000 mg | Freq: Four times a day (QID) | INTRAMUSCULAR | Status: DC | PRN

## 2014-01-29 MED ORDER — SODIUM CHLORIDE 0.45 % IV SOLN
INTRAVENOUS | Status: AC
  Administered 2014-01-29 (×2): via INTRAVENOUS
  Administered 2014-01-30: 1000 mL via INTRAVENOUS

## 2014-01-29 MED ORDER — METHADONE HCL 5 MG PO TABS: 5.0000 mg | ORAL_TABLET | Freq: Every day | ORAL | Status: DC

## 2014-01-29 MED ORDER — HYDROMORPHONE 0.3 MG/ML IV SOLN
INTRAVENOUS | Status: DC
  Administered 2014-01-29: 4.35 mg via INTRAVENOUS
  Administered 2014-01-29: 4.8 mg via INTRAVENOUS
  Administered 2014-01-29: 23:00:00 via INTRAVENOUS
  Administered 2014-01-29: 0.3 mg via INTRAVENOUS
  Administered 2014-01-29: 7.51 mg via INTRAVENOUS
  Administered 2014-01-29: 6.72 mg via INTRAVENOUS
  Administered 2014-01-29: 15:00:00 via INTRAVENOUS
  Administered 2014-01-29: 6.24 mg via INTRAVENOUS
  Administered 2014-01-29: 18:00:00 via INTRAVENOUS
  Administered 2014-01-30: 5.12 mg via INTRAVENOUS
  Administered 2014-01-30: 4.68 mg via INTRAVENOUS
  Administered 2014-01-30 (×2): via INTRAVENOUS
  Filled 2014-01-29 (×6): qty 25

## 2014-01-29 MED ORDER — HYDROMORPHONE 0.3 MG/ML IV SOLN
INTRAVENOUS | Status: DC
  Filled 2014-01-29: qty 25

## 2014-01-29 MED ORDER — HYDROXYUREA 500 MG PO CAPS
1500.0000 mg | ORAL_CAPSULE | Freq: Every day | ORAL | Status: DC
  Administered 2014-01-29 – 2014-01-31 (×3): 1500 mg via ORAL
  Filled 2014-01-29 (×5): qty 3

## 2014-01-29 MED ORDER — HYDROMORPHONE 0.3 MG/ML IV SOLN: INTRAVENOUS | Status: DC

## 2014-01-29 MED ORDER — HYDROMORPHONE HCL PF 1 MG/ML IJ SOLN
1.0000 mg | Freq: Once | INTRAMUSCULAR | Status: AC
  Administered 2014-01-29: 1 mg via INTRAVENOUS
  Filled 2014-01-29: qty 1

## 2014-01-29 MED ORDER — NALOXONE HCL 0.4 MG/ML IJ SOLN: 0.4000 mg | INTRAMUSCULAR | Status: DC | PRN

## 2014-01-29 MED ORDER — ENOXAPARIN SODIUM 40 MG/0.4ML ~~LOC~~ SOLN
40.0000 mg | SUBCUTANEOUS | Status: DC
  Administered 2014-01-29 – 2014-01-31 (×3): 40 mg via SUBCUTANEOUS
  Filled 2014-01-29 (×4): qty 0.4

## 2014-01-29 MED ORDER — SODIUM CHLORIDE 0.9 % IV SOLN: INTRAVENOUS | Status: DC

## 2014-01-29 MED ORDER — HYDROMORPHONE HCL PF 2 MG/ML IJ SOLN
2.0000 mg | Freq: Once | INTRAMUSCULAR | Status: AC
Start: 2014-01-29 — End: 2014-01-29
  Administered 2014-01-29: 2 mg via INTRAVENOUS
  Filled 2014-01-29: qty 1

## 2014-01-29 NOTE — Telephone Encounter (Signed)
Prescription refilled for Methadone 5 mg #30 and Oxycodone 10 mg #135 (15 mg q4 hours PRN Moderate to sever pain).

## 2014-01-29 NOTE — ED Notes (Signed)
Pt reports bila knee pain and R elbow pain, states this is her normal sickle cell crisis.  Pt states pain started x 2.5 hours ago.

## 2014-01-29 NOTE — Telephone Encounter (Signed)
Refill reqeust oxycodone 10mg  and methadone 5mg  LOV 10/28/2013. Please advise. Thanks!

## 2014-01-29 NOTE — Plan of Care (Signed)
Problem: Phase III Progression Outcomes Goal: Progress with ambulation Outcome: Progressing Up as tol in room

## 2014-01-29 NOTE — ED Notes (Signed)
MD at bedside. Kakrakandy hopitalist

## 2014-01-29 NOTE — Progress Notes (Signed)
Patient Controlled Analgesia 24 hour Summary  Total mg delivered: 6.24 mg Number of demands: 8 Number of deliveries: 6

## 2014-01-29 NOTE — H&P (Signed)
[facility] History and Physical  [name] NKN:[id number] DOB: 04/11/1991 DOA: 01/28/2014  Referring physician: ER physician. PCP: MATTHEWS,MICHELLE A., MD   Chief Complaint: Knee pain and chest pain.  HPI: [name] is a 23 y.o. female with history of sickle cell disease presents with complaints of bilateral knee pain and chest pain which patient states is typical of her sickle cell pain crisis. Patient denies any fever chills shortness of breath productive cough. Denies any headache visual symptoms or any focal deficits. In the ER patient was not hypoxic and not febrile. Chest x-ray did not show anything acute and EKG were showing normal sinus rhythm. Patient has been admitted for sickle cell pain crisis.   Review of Systems: As presented in the history of presenting illness, rest negative.  Past Medical History  Diagnosis Date  . H/O: 1 miscarriage 03/22/2011    Pt reports 2 miscarriages.  [name] 01/08/2009  . Depression 01/06/2011  . GERD (gastroesophageal reflux disease) 02/17/2011  . Trichotillomania     h/o  . Blood transfusion     "lots"  . Sickle cell anemia with crisis   . Exertional dyspnea     "sometimes"  . Sickle cell anemia   . Headache(784.0)   . Migraines 11/08/11    "@ least twice/month"  . Chronic back pain     "very severe; have knot in my back; from tight muscle; take RX and exercise for it"  . Mood swings 11/08/11    "I go back and forth; real bad"  . Pregnancy   . Blood dyscrasia     SICKLE CELL   Past Surgical History  Procedure Laterality Date  . Cholecystectomy  05/2010  . Dilation and curettage of uterus  02/20/11    S/P miscarriage   Social History:  reports that she quit smoking about 10 months ago. Her smoking use included Cigarettes. She has a .25 pack-year smoking history. She has never used smokeless tobacco. She reports that she drinks alcohol. She reports that she does not use illicit drugs. Where does  patient live home. Can patient participate in ADLs? Yes.  Allergies  Allergen Reactions  . Carrot Oil Hives and Swelling  . Latex Rash    Family History:  Family History  Problem Relation Age of Onset  . Diabetes Mother   . Alcoholism    . Depression    . Hypercholesterolemia    . Hypertension    . Migraines    . Diabetes Maternal Grandmother   . Diabetes Paternal Grandmother       Prior to Admission medications   Medication Sig Start Date End Date Taking? Authorizing Provider  hydroxyurea (HYDREA) 500 MG capsule Take 1,500 mg by mouth daily. May take with food to minimize GI side effects.   Yes Historical Provider, MD  methadone (DOLOPHINE) 5 MG tablet Take 5 mg by mouth at bedtime.   Yes Historical Provider, MD  Oxycodone HCl 10 MG TABS Take 15 mg by mouth every 6 (six) hours as needed (moderate pain).   Yes Historical Provider, MD    Physical Exam: Filed Vitals:   01/28/14 2253 01/29/14 0235 01/29/14 0303  BP: 109/57 111/65 105/57  Pulse: 99 99 94  Temp: 98.2 F (36.8 C)  98.9 F (37.2 C)  TempSrc: Oral  Oral  Resp:  21 20  Height:   5\' 1"  (1.549 m)  Weight:   78.6 kg (173 lb 4.5 oz)  SpO2: 100% 99% 99%  General:  Well-developed well-nourished.  Eyes: Anicteric no pallor.  ENT: No discharge from the ears eyes nose mouth.  Neck: No mass felt.  Cardiovascular: S1-S2 heard.  Respiratory: No rhonchi or crepitations.  Abdomen: Soft nontender bowel sounds present. No guarding or rigidity.  Skin: No rash.  Musculoskeletal: Patient has significant pain in both knees mostly on the left knee.  Psychiatric: Appears normal.  Neurologic: Alert awake oriented to time place and person. Moves all extremities.  Labs on Admission:  Basic Metabolic Panel:  Recent Labs Lab 01/28/14 2316  NA 138  K 3.9  CL 99  CO2 24  GLUCOSE 92  BUN 6  CREATININE 0.70  CALCIUM 9.5   Liver Function Tests: No results found for this basename: AST, ALT, ALKPHOS,  BILITOT, PROT, ALBUMIN,  in the last 168 hours No results found for this basename: LIPASE, AMYLASE,  in the last 168 hours No results found for this basename: AMMONIA,  in the last 168 hours CBC:  Recent Labs Lab 01/28/14 2316  WBC 20.8*  NEUTROABS 16.2*  HGB 9.8*  HCT 29.5*  MCV 89.7  PLT 709*   Cardiac Enzymes: No results found for this basename: CKTOTAL, CKMB, CKMBINDEX, TROPONINI,  in the last 168 hours  BNP (last 3 results) No results found for this basename: PROBNP,  in the last 8760 hours CBG: No results found for this basename: GLUCAP,  in the last 168 hours  Radiological Exams on Admission: Dg Chest 2 View  01/29/2014   CLINICAL DATA:  Sickle cell crisis.  Chest pain.  EXAM: CHEST  2 VIEW  COMPARISON:  Chest radiograph performed 01/13/2014  FINDINGS: The lungs are well-aerated and clear. There is no evidence of focal opacification, pleural effusion or pneumothorax.  The heart is normal in size; the mediastinal contour is within normal limits. No acute osseous abnormalities are seen. H-shaped vertebral bodies are compatible with the patient's known sickle cell disease. Clips are noted within the right upper quadrant, reflecting prior cholecystectomy.  IMPRESSION: No acute cardiopulmonary process seen.   Electronically Signed   By: Garald Balding M.D.   On: 01/29/2014 01:12    EKG: Independently reviewed. Normal sinus rhythm.  Assessment/Plan Principal Problem:   Sickle cell pain crisis Active Problems:   Sickle cell anemia   1. Sickle cell pain crisis - patient has been placed on custom dose Dilaudid PCA. Gentle hydration. Continue patient's home medications. Patient has chest pain but has no hypoxia or fever and chest x-ray has no infiltrates to suggest acute chest syndrome. 2. Leukocytosis - probably reactionary. Patient has significant pain in both her knees particularly with left knee. Check x-ray of the left knee for any definite effusion. 3. Anemia from sickle  cell disease.    Code Status: Full code.  Family Communication: None.  Disposition Plan: Admit to inpatient.    [name] N. [facility] Pager [phone].  If 7PM-7AM, please contact night-coverage [email or web address] Password [facility] 01/29/2014, 3:44 AM

## 2014-01-29 NOTE — Plan of Care (Signed)
Problem: Phase II Progression Outcomes Goal: Pain at, < goal with appropriate interventions Outcome: Progressing Pain 6-7/10 in left knee only. Patient reports PCA working "wonderfully" for pain.

## 2014-01-30 LAB — BASIC METABOLIC PANEL
Anion gap: 11 (ref 5–15)
BUN: 11 mg/dL (ref 6–23)
CALCIUM: 8.9 mg/dL (ref 8.4–10.5)
CO2: 24 mEq/L (ref 19–32)
Chloride: 103 mEq/L (ref 96–112)
Creatinine, Ser: 0.7 mg/dL (ref 0.50–1.10)
Glucose, Bld: 115 mg/dL — ABNORMAL HIGH (ref 70–99)
Potassium: 4.1 mEq/L (ref 3.7–5.3)
SODIUM: 138 meq/L (ref 137–147)

## 2014-01-30 LAB — CBC WITH DIFFERENTIAL/PLATELET
BASOS ABS: 0.1 10*3/uL (ref 0.0–0.1)
Basophils Relative: 1 % (ref 0–1)
EOS ABS: 0.5 10*3/uL (ref 0.0–0.7)
EOS PCT: 3 % (ref 0–5)
HCT: 24.4 % — ABNORMAL LOW (ref 36.0–46.0)
Hemoglobin: 8.3 g/dL — ABNORMAL LOW (ref 12.0–15.0)
Lymphocytes Relative: 16 % (ref 12–46)
Lymphs Abs: 2.8 10*3/uL (ref 0.7–4.0)
MCH: 30.2 pg (ref 26.0–34.0)
MCHC: 34 g/dL (ref 30.0–36.0)
MCV: 88.7 fL (ref 78.0–100.0)
Monocytes Absolute: 1.6 10*3/uL — ABNORMAL HIGH (ref 0.1–1.0)
Monocytes Relative: 9 % (ref 3–12)
NEUTROS PCT: 71 % (ref 43–77)
Neutro Abs: 12.4 10*3/uL — ABNORMAL HIGH (ref 1.7–7.7)
PLATELETS: 594 10*3/uL — AB (ref 150–400)
RBC: 2.75 MIL/uL — ABNORMAL LOW (ref 3.87–5.11)
RDW: 16.9 % — AB (ref 11.5–15.5)
WBC: 17.3 10*3/uL — ABNORMAL HIGH (ref 4.0–10.5)

## 2014-01-30 MED ORDER — HYDROMORPHONE HCL 2 MG/ML IJ SOLN
INTRAMUSCULAR | Status: AC
  Filled 2014-01-30: qty 1

## 2014-01-30 MED ORDER — HYDROMORPHONE HCL 2 MG/ML IJ SOLN
2.0000 mg | Freq: Once | INTRAMUSCULAR | Status: AC
  Administered 2014-01-30: 2 mg via INTRAVENOUS

## 2014-01-30 MED ORDER — KETOROLAC TROMETHAMINE 30 MG/ML IJ SOLN
30.0000 mg | Freq: Three times a day (TID) | INTRAMUSCULAR | Status: DC | PRN
  Administered 2014-01-30 – 2014-01-31 (×2): 30 mg via INTRAVENOUS
  Filled 2014-01-30 (×2): qty 1

## 2014-01-30 MED ORDER — HYDROMORPHONE 0.3 MG/ML IV SOLN
INTRAVENOUS | Status: DC
  Administered 2014-01-30: 23:00:00 via INTRAVENOUS
  Administered 2014-01-30: 6.01 mg via INTRAVENOUS
  Administered 2014-01-30: 19:00:00 via INTRAVENOUS
  Administered 2014-01-30: 0.799 mg via INTRAVENOUS
  Administered 2014-01-30: 7.49 mg via INTRAVENOUS
  Administered 2014-01-30: 6.06 mg via INTRAVENOUS
  Administered 2014-01-31: via INTRAVENOUS
  Administered 2014-01-31: 4.68 mg via INTRAVENOUS
  Administered 2014-01-31: 19:00:00 via INTRAVENOUS
  Administered 2014-01-31: 5.3 mg via INTRAVENOUS
  Administered 2014-01-31: 13:00:00 via INTRAVENOUS
  Administered 2014-01-31: 4.92 mg via INTRAVENOUS
  Administered 2014-01-31: 06:00:00 via INTRAVENOUS
  Administered 2014-01-31: 7.2 mg via INTRAVENOUS
  Administered 2014-01-31: 3.96 mg via INTRAVENOUS
  Administered 2014-02-01: 2.07 mg via INTRAVENOUS
  Administered 2014-02-01: 3.03 mg via INTRAVENOUS
  Administered 2014-02-01: 02:00:00 via INTRAVENOUS
  Administered 2014-02-01: 3.38 mg via INTRAVENOUS
  Filled 2014-01-30 (×6): qty 25

## 2014-01-30 NOTE — Progress Notes (Signed)
Patient has maxed out PCA. Paged NP Baltazar Najjar. Orders received for toradol prn. Added basal rate to PCA per order.

## 2014-01-30 NOTE — Progress Notes (Signed)
Walked into patient room. Patient OOB at sink. Weight bearing on both legs. No vocalization of any rt knee pain until she was aware I was in doorway of room. Patient then began limping and moaning/groaning to bed. At 8am 24 hr total = 33.75mg               Demands = 64               Delivered = 46 [name], [name]

## 2014-01-30 NOTE — Plan of Care (Signed)
Problem: Phase II Progression Outcomes Goal: Other Phase II Outcomes/Goals Outcome: Progressing Pain 7/10 in knees bilaterally. On PCA. toradol added.

## 2014-01-30 NOTE — Plan of Care (Signed)
Problem: Phase III Progression Outcomes Goal: IV fluids wean to po Outcome: Progressing kvo fluids per order this am

## 2014-01-30 NOTE — Progress Notes (Signed)
Patient crying in room in pain. Pain in right knee especially. Pain 9-10/10. Has maxed out PCA for hour limit. Paged Dr. Jonelle Sidle. Orders received for bolus x1.

## 2014-01-30 NOTE — Progress Notes (Signed)
Subjective: 23 year old female admitted with sickle cell painful crisis. Patient has been having continuous pain despite adequate medication. She straightened her pain as 10 out of 10 mainly involving her knees bilaterally. She has been able to walk around to the bathroom. No chest pain no cough and patient is also having some emotional distress. Denied any fever or chills. Denies any nausea vomiting or diarrhea. She is on Dilaudid PCA and has used 34.75 mg of Dilaudid with 64 and 46 deliveries. No observed problems. He says she's not getting adequate pain control when she is sleeping.  Objective: Vital signs in last 24 hours: Temp:  [97.7 F (36.5 C)-98.7 F (37.1 C)] 98.3 F (36.8 C) (09/17 0648) Pulse Rate:  [92-111] 101 (09/17 0648) Resp:  [10-18] 10 (09/17 0712) BP: (95-126)/(52-73) 95/52 mmHg (09/17 0648) SpO2:  [92 %-98 %] 98 % (09/17 0648) FiO2 (%):  [21 %-51 %] 51 % (09/17 0625) Weight:  [80.6 kg (177 lb 11.1 oz)] 80.6 kg (177 lb 11.1 oz) (09/17 0648) Weight change: 2 kg (4 lb 6.6 oz) Last BM Date: 01/29/14  Intake/Output from previous day: 09/16 0701 - 09/17 0700 In: 3765.7 [P.O.:1440; I.V.:2325.7] Out: 3000 [Urine:3000] Intake/Output this shift:    General appearance: alert, cooperative and no distress Eyes: conjunctivae/corneas clear. PERRL, EOM's intact. Fundi benign. Neck: no adenopathy, no carotid bruit, no JVD, supple, symmetrical, trachea midline and thyroid not enlarged, symmetric, no tenderness/mass/nodules Back: symmetric, no curvature. ROM normal. No CVA tenderness. Resp: clear to auscultation bilaterally Chest wall: no tenderness Cardio: regular rate and rhythm, S1, S2 normal, no murmur, click, rub or gallop GI: soft, non-tender; bowel sounds normal; no masses,  no organomegaly Extremities: extremities normal, atraumatic, no cyanosis or edema Pulses: 2+ and symmetric Skin: Skin color, texture, turgor normal. No rashes or lesions Neurologic: Grossly  normal  Lab Results:  Recent Labs  01/29/14 0702 01/30/14 0625  WBC 21.6* 17.3*  HGB 9.4* 8.3*  HCT 26.7* 24.4*  PLT 586* 594*   BMET  Recent Labs  01/29/14 0702 01/30/14 0625  NA 139 138  K 4.4 4.1  CL 104 103  CO2 19 24  GLUCOSE 91 115*  BUN 8 11  CREATININE 0.71 0.70  CALCIUM 8.9 8.9    Studies/Results: Dg Chest 2 View  01/29/2014   CLINICAL DATA:  Sickle cell crisis.  Chest pain.  EXAM: CHEST  2 VIEW  COMPARISON:  Chest radiograph performed 01/13/2014  FINDINGS: The lungs are well-aerated and clear. There is no evidence of focal opacification, pleural effusion or pneumothorax.  The heart is normal in size; the mediastinal contour is within normal limits. No acute osseous abnormalities are seen. H-shaped vertebral bodies are compatible with the patient's known sickle cell disease. Clips are noted within the right upper quadrant, reflecting prior cholecystectomy.  IMPRESSION: No acute cardiopulmonary process seen.   Electronically Signed   By: Garald Balding M.D.   On: 01/29/2014 01:12   Dg Knee Complete 4 Views Left  01/29/2014   CLINICAL DATA:  Left knee pain. No injury. History of sickle cell disease.  EXAM: LEFT KNEE - COMPLETE 4+ VIEW  COMPARISON:  05/28/2013  FINDINGS: Subtle overall increased bone density compatible with history of sickle cell disease. No evidence of fracture, dislocation or joint effusion. Subtle stable sclerosis over the medial femoral condyle.  IMPRESSION: No acute findings.   Electronically Signed   By: Marin Olp M.D.   On: 01/29/2014 11:57    Medications: I have reviewed the patient's current  medications.  Assessment/Plan: A 23 year old female admitted with sickle cell painful crisis. Patient seems to be having adequate pain control but still having pain.   #1 sickle cell painful crisis: Patient is still not having adequate pain control early in the morning. I will add nighttime continues dose. Continue with Toradol and supportive  care.  #2 sickle cell anemia: Hemoglobin seems stable follow it closely.  #3 depression anxiety: Patient is being counseled.  #4 leukocytosis: Due to vaso-occlusive crisis. Continue treatment.  LOS: 2 days   Anjani Feuerborn,LAWAL 01/30/2014, 11:01 AM

## 2014-01-31 LAB — CBC WITH DIFFERENTIAL/PLATELET
BASOS PCT: 0 % (ref 0–1)
Basophils Absolute: 0.1 10*3/uL (ref 0.0–0.1)
Eosinophils Absolute: 0.4 10*3/uL (ref 0.0–0.7)
Eosinophils Relative: 3 % (ref 0–5)
HCT: 24.2 % — ABNORMAL LOW (ref 36.0–46.0)
HEMOGLOBIN: 8.2 g/dL — AB (ref 12.0–15.0)
Lymphocytes Relative: 21 % (ref 12–46)
Lymphs Abs: 2.7 10*3/uL (ref 0.7–4.0)
MCH: 30.1 pg (ref 26.0–34.0)
MCHC: 33.9 g/dL (ref 30.0–36.0)
MCV: 89 fL (ref 78.0–100.0)
MONO ABS: 1.3 10*3/uL — AB (ref 0.1–1.0)
MONOS PCT: 11 % (ref 3–12)
NEUTROS PCT: 65 % (ref 43–77)
Neutro Abs: 8.2 10*3/uL — ABNORMAL HIGH (ref 1.7–7.7)
Platelets: 591 10*3/uL — ABNORMAL HIGH (ref 150–400)
RBC: 2.72 MIL/uL — ABNORMAL LOW (ref 3.87–5.11)
RDW: 17.1 % — ABNORMAL HIGH (ref 11.5–15.5)
WBC: 12.6 10*3/uL — ABNORMAL HIGH (ref 4.0–10.5)

## 2014-01-31 LAB — COMPREHENSIVE METABOLIC PANEL
ALBUMIN: 3.7 g/dL (ref 3.5–5.2)
ALT: 11 U/L (ref 0–35)
ANION GAP: 10 (ref 5–15)
AST: 32 U/L (ref 0–37)
Alkaline Phosphatase: 60 U/L (ref 39–117)
BUN: 8 mg/dL (ref 6–23)
CO2: 27 mEq/L (ref 19–32)
CREATININE: 0.8 mg/dL (ref 0.50–1.10)
Calcium: 9.2 mg/dL (ref 8.4–10.5)
Chloride: 102 mEq/L (ref 96–112)
GFR calc Af Amer: >90 mL/min (ref >90)
GFR calc non Af Amer: >90 mL/min (ref >90)
Glucose, Bld: 105 mg/dL — ABNORMAL HIGH (ref 70–99)
POTASSIUM: 4.7 meq/L (ref 3.7–5.3)
Sodium: 139 mEq/L (ref 137–147)
TOTAL PROTEIN: 7.3 g/dL (ref 6.0–8.3)
Total Bilirubin: 2.4 mg/dL — ABNORMAL HIGH (ref 0.3–1.2)

## 2014-01-31 NOTE — Care Management Note (Signed)
CARE MANAGEMENT NOTE 01/31/2014  Patient:  [name], [name]   Account Number:  [id number]  Date Initiated:  01/31/2014  Documentation initiated by:  Leafy Kindle  Subjective/Objective Assessment:   23 yo pt admitted with [facility]     Action/Plan:   From Home   Anticipated DC Date:  02/03/2014   Anticipated DC Plan:  [place]  CM consult      Choice offered to / List presented to:             Status of service:  In process, will continue to follow Medicare Important Message given?  YES (If response is "NO", the following Medicare IM given date fields will be blank) Date Medicare IM given:  01/31/2014 Medicare IM given by:  Leafy Kindle Date Additional Medicare IM given:   Additional Medicare IM given by:    Discharge Disposition:    Per UR Regulation:  Reviewed for med. necessity/level of care/duration of stay  If discussed at [place] of Stay Meetings, dates discussed:    Comments:  Marney Doctor RN,BSN,NCM [phone]

## 2014-01-31 NOTE — Progress Notes (Signed)
Patient Controlled Analgesia 24 hour Summary  Total mg delivered: 28.11 MG Number of demands: 38 Number of deliveries: 32

## 2014-01-31 NOTE — Progress Notes (Signed)
Subjective: Patient is still having pain at 8/10. She feels a little better and was able to sleep last night because of the basal rate of Dilaudid. She's been walking around the room and outside. Denied any shortness of breath, no cough no nausea vomiting or diarrhea. She has been on the Dilaudid PCA pump and has used 28 mg in the last 24 hours with 50 demands and 46 deliveries. She has been worried about high knees bilaterally that have occasionally gotten swollen. She is however able to walk on them.  Objective: Vital signs in last 24 hours: Temp:  [97.9 F (36.6 C)-98.1 F (36.7 C)] 98 F (36.7 C) (09/18 0546) Pulse Rate:  [94-108] 101 (09/18 0730) Resp:  [8-18] 8 (09/18 0820) BP: (115-121)/(62-77) 115/77 mmHg (09/18 0546) SpO2:  [95 %-100 %] 100 % (09/18 0820) FiO2 (%):  [21 %] 21 % (09/18 0537) Weight:  [80.786 kg (178 lb 1.6 oz)] 80.786 kg (178 lb 1.6 oz) (09/18 0546) Weight change: 0.186 kg (6.6 oz) Last BM Date: 01/29/14  Intake/Output from previous day: 09/17 0701 - 09/18 0700 In: 2363.5 [P.O.:1960; I.V.:403.5] Out: 2750 [Urine:2750] Intake/Output this shift:    General appearance: alert, cooperative and no distress Eyes: conjunctivae/corneas clear. PERRL, EOM's intact. Fundi benign. Neck: no adenopathy, no carotid bruit, no JVD, supple, symmetrical, trachea midline and thyroid not enlarged, symmetric, no tenderness/mass/nodules Back: symmetric, no curvature. ROM normal. No CVA tenderness. Resp: clear to auscultation bilaterally Chest wall: no tenderness Cardio: regular rate and rhythm, S1, S2 normal, no murmur, click, rub or gallop GI: soft, non-tender; bowel sounds normal; no masses,  no organomegaly Extremities: extremities normal, atraumatic, no cyanosis or edema Pulses: 2+ and symmetric Skin: Skin color, texture, turgor normal. No rashes or lesions Neurologic: Grossly normal  Lab Results:  Recent Labs  01/30/14 0625 01/31/14 0405  WBC 17.3* 12.6*  HGB 8.3*  8.2*  HCT 24.4* 24.2*  PLT 594* 591*   BMET  Recent Labs  01/30/14 0625 01/31/14 0405  NA 138 139  K 4.1 4.7  CL 103 102  CO2 24 27  GLUCOSE 115* 105*  BUN 11 8  CREATININE 0.70 0.80  CALCIUM 8.9 9.2    Studies/Results: Dg Knee Complete 4 Views Left  01/29/2014   CLINICAL DATA:  Left knee pain. No injury. History of sickle cell disease.  EXAM: LEFT KNEE - COMPLETE 4+ VIEW  COMPARISON:  05/28/2013  FINDINGS: Subtle overall increased bone density compatible with history of sickle cell disease. No evidence of fracture, dislocation or joint effusion. Subtle stable sclerosis over the medial femoral condyle.  IMPRESSION: No acute findings.   Electronically Signed   By: Marin Olp M.D.   On: 01/29/2014 11:57    Medications: I have reviewed the patient's current medications.  Assessment/Plan: A 23 year old female admitted with sickle cell painful crisis. Patient seems to be having adequate pain control but still having pain.   #1 sickle cell painful crisis: Patient is having adequate pain control now. Continue with Toradol and supportive care. Will begin to titrate off the Dilaudid in preparation for discharge  #2 sickle cell anemia: Hemoglobin seems stable follow it closely.  #3 depression anxiety: Continue home medications.  #4 leukocytosis: Due to vaso-occlusive crisis. Much improved. Continue treatment.  LOS: 3 days   Synia Douglass,LAWAL 01/31/2014, 10:50 AM

## 2014-02-01 MED ORDER — METHADONE HCL 5 MG PO TABS: 5.0000 mg | ORAL_TABLET | Freq: Every day | ORAL | Status: DC

## 2014-02-01 MED ORDER — OXYCODONE HCL 10 MG PO TABS: 15.0000 mg | ORAL_TABLET | Freq: Four times a day (QID) | ORAL | Status: DC | PRN

## 2014-02-01 NOTE — Discharge Summary (Signed)
Physician Discharge Summary  Patient ID: [name] MRN: [id number] DOB/AGE: Jul 14, 1990 23 y.o.  Admit date: 01/28/2014 Discharge date: 02/01/2014  Admission Diagnoses:  Discharge Diagnoses:  Principal Problem:   Sickle cell pain crisis Active Problems:   Depression   GERD (gastroesophageal reflux disease)   Leukocytosis   Sickle cell anemia   Discharged Condition: good  Hospital Course: Patient was admitted with sickle cell painful crisis as well as weakness. She was seen and evaluated and started on Dilaudid PCA custom dose. She was also placed on Toradol IV hydration. Patient was continued on her home dose of methadone as well as oxycodone. She responded to treatment very well. She's had problem with her knees but that is chronic as well. She is being discharged home today and her prescriptions have been refilled since it is a weekend and the clinic is closed. She'll follow up with Dr. Zigmund Daniel as previously scheduled.  Consults: None  Significant Diagnostic Studies: labs: Her CBC is on BMPs were checked. She did not require any blood transfusion and no hemolytic crisis.  Treatments: IV hydration and analgesia: Dilaudid  Discharge Exam: Blood pressure 108/58, pulse 90, temperature 97.4 F (36.3 C), temperature source Oral, resp. rate 18, height 5\' 1"  (1.549 m), weight 80.786 kg (178 lb 1.6 oz), last menstrual period 01/06/2014, SpO2 98.00%. General appearance: alert, cooperative and no distress Eyes: conjunctivae/corneas clear. PERRL, EOM's intact. Fundi benign. Ears: normal TM's and external ear canals both ears Nose: Nares normal. Septum midline. Mucosa normal. No drainage or sinus tenderness. Back: symmetric, no curvature. ROM normal. No CVA tenderness. Resp: clear to auscultation bilaterally Chest wall: no tenderness Cardio: regular rate and rhythm, S1, S2 normal, no murmur, click, rub or gallop GI: soft, non-tender; bowel sounds normal; no masses,  no  organomegaly Extremities: extremities normal, atraumatic, no cyanosis or edema Pulses: 2+ and symmetric Skin: Skin color, texture, turgor normal. No rashes or lesions Neurologic: Grossly normal  Disposition: 01-Home or Self Care     Medication List         hydroxyurea 500 MG capsule  Commonly known as:  HYDREA  Take 1,500 mg by mouth daily. May take with food to minimize GI side effects.     methadone 5 MG tablet  Commonly known as:  DOLOPHINE  Take 1 tablet (5 mg total) by mouth at bedtime.     Oxycodone HCl 10 MG Tabs  Take 1.5 tablets (15 mg total) by mouth every 6 (six) hours as needed (moderate pain).         SignedBarbette Merino 02/01/2014, 9:16 AM  Time spent 35 minutes

## 2014-02-01 NOTE — Progress Notes (Signed)
NCM issued additional Medicare IM and explained rights through Medicare. Pt is refusing to dc to home "stating she wants her IV pain medication". Jonnie Finner RN CCM Case Mgmt phone [phone]

## 2014-02-01 NOTE — Progress Notes (Signed)
Pt. Has been discharged home.  Pt. Became upset that IV Dilaudid PCA would not be continued till her ride arrived at 5:00 PM. Pt. Was offered IV torodol that was on her MAR. Pt. Was upset and wished to speak with the MD. MD was contacted who stated that he was not prescribing additional pain medications and would not be changing her discharge status.  Pt. Became very upset.  RN asked for [facility] to speak with pt. RNCM spoke with pt. About her medicare rights. Pt. Stated that she still refused to leave.  AC and security were asked to come to the pt.'s room.  Pt. Stated that her mother would be picking her up at 1230. Pt. Allowed RN to remove IV and discharge instructions and prescriptions were given to pt.

## 2014-02-07 IMAGING — US DG FLUORO GUIDE CV LINE
1 series · 2 of 2 positions shown · non-contrast
Comparison: none

CLINICAL DATA: Sickle cell crisis, pain

[Series 1: ir fluoro guide cv midline picc *right* · 2 of 2 slices shown]
[im 1/2]
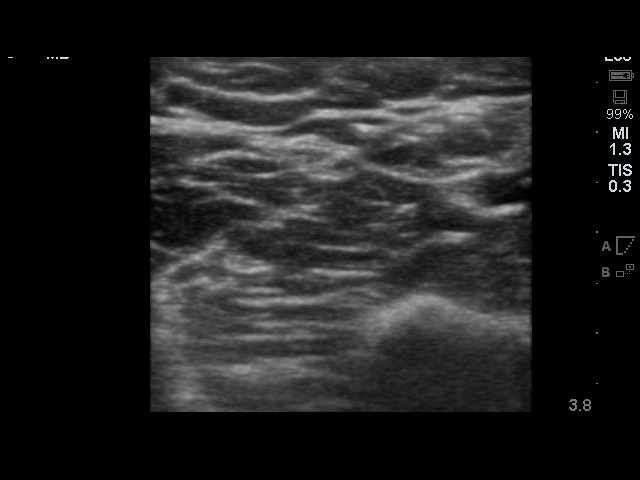
[im 2/2]
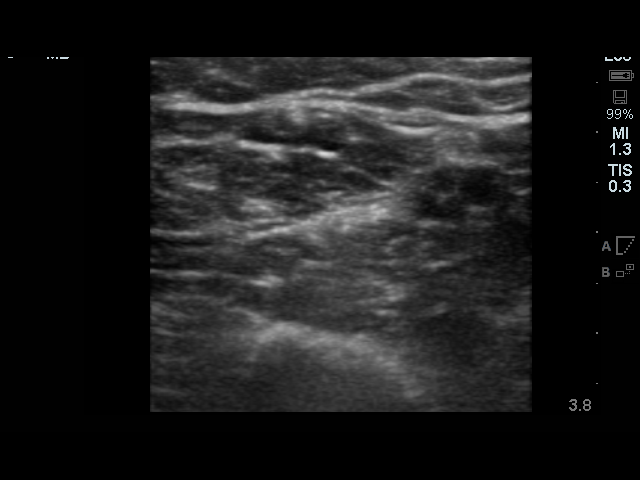

[2 of 2 positions shown; findings below may reference images not displayed]

EXAM:
RIGHT UPPER EXTREMITY PICC LINE PLACEMENT WITH ULTRASOUND AND
FLUOROSCOPIC GUIDANCE

FLUOROSCOPY TIME:  12 seconds

PROCEDURE:
The patient was advised of the possible risks and complications and
agreed to undergo the procedure. The patient was then brought to the
angiographic suite for the procedure.

The right arm was prepped with chlorhexidine, draped in the usual
sterile fashion using maximum barrier technique (cap and mask,
sterile gown, sterile gloves, large sterile sheet, hand hygiene and
cutaneous antisepsis) and infiltrated locally with 1% Lidocaine.

Ultrasound demonstrated patency of the right basilic vein, and this
was documented with an image. Under real-time ultrasound guidance,
this vein was accessed with a 21 gauge micropuncture needle and
image documentation was performed. A [DATE] wire was introduced in to
the vein. Over this, a 5 French double lumen power injectable PICC
was advanced to the lower SVC/right atrial junction. Fluoroscopy
during the procedure and fluoro spot radiograph confirms appropriate
catheter position. The catheter was flushed and covered with a
sterile dressing.

Complications: No immediate
IMPRESSION: Successful right arm power PICC line placement with ultrasound and
fluoroscopic guidance. The catheter is ready for use.

## 2014-02-16 ENCOUNTER — Encounter (HOSPITAL_COMMUNITY): Payer: Self-pay | Admitting: Emergency Medicine

## 2014-02-16 ENCOUNTER — Emergency Department (HOSPITAL_COMMUNITY)
Admission: EM | Admit: 2014-02-16 | Discharge: 2014-02-16 | Disposition: A | Discharge status: Home / Self Care | Payer: Medicare Other | Source: Home / Self Care | Attending: Emergency Medicine | Admitting: Emergency Medicine

## 2014-02-16 ENCOUNTER — Inpatient Hospital Stay (HOSPITAL_COMMUNITY)
Admission: EM | Admit: 2014-02-16 | Discharge: 2014-02-25 | DRG: 812 | Disposition: A | Discharge status: Home / Self Care | Payer: Medicare Other | Attending: Internal Medicine | Admitting: Internal Medicine

## 2014-02-16 ENCOUNTER — Emergency Department (HOSPITAL_COMMUNITY): Payer: Medicare Other

## 2014-02-16 DIAGNOSIS — M79603 Pain in arm, unspecified: Secondary | ICD-10-CM | POA: Diagnosis not present

## 2014-02-16 DIAGNOSIS — Z8659 Personal history of other mental and behavioral disorders: Secondary | ICD-10-CM | POA: Insufficient documentation

## 2014-02-16 DIAGNOSIS — M79604 Pain in right leg: Secondary | ICD-10-CM | POA: Diagnosis not present

## 2014-02-16 DIAGNOSIS — D72829 Elevated white blood cell count, unspecified: Secondary | ICD-10-CM

## 2014-02-16 DIAGNOSIS — Z8719 Personal history of other diseases of the digestive system: Secondary | ICD-10-CM | POA: Insufficient documentation

## 2014-02-16 DIAGNOSIS — Z8679 Personal history of other diseases of the circulatory system: Secondary | ICD-10-CM

## 2014-02-16 DIAGNOSIS — K5900 Constipation, unspecified: Secondary | ICD-10-CM

## 2014-02-16 DIAGNOSIS — R079 Chest pain, unspecified: Secondary | ICD-10-CM | POA: Diagnosis not present

## 2014-02-16 DIAGNOSIS — R269 Unspecified abnormalities of gait and mobility: Secondary | ICD-10-CM | POA: Diagnosis present

## 2014-02-16 DIAGNOSIS — Z9049 Acquired absence of other specified parts of digestive tract: Secondary | ICD-10-CM | POA: Diagnosis present

## 2014-02-16 DIAGNOSIS — D5700 Hb-SS disease with crisis, unspecified: Secondary | ICD-10-CM | POA: Diagnosis not present

## 2014-02-16 DIAGNOSIS — G8929 Other chronic pain: Secondary | ICD-10-CM | POA: Diagnosis not present

## 2014-02-16 DIAGNOSIS — Z79891 Long term (current) use of opiate analgesic: Secondary | ICD-10-CM | POA: Diagnosis not present

## 2014-02-16 DIAGNOSIS — E860 Dehydration: Secondary | ICD-10-CM | POA: Diagnosis present

## 2014-02-16 DIAGNOSIS — Z91040 Latex allergy status: Secondary | ICD-10-CM | POA: Insufficient documentation

## 2014-02-16 DIAGNOSIS — D571 Sickle-cell disease without crisis: Secondary | ICD-10-CM

## 2014-02-16 DIAGNOSIS — Z79899 Other long term (current) drug therapy: Secondary | ICD-10-CM

## 2014-02-16 DIAGNOSIS — M90551 Osteonecrosis in diseases classified elsewhere, right thigh: Secondary | ICD-10-CM

## 2014-02-16 DIAGNOSIS — Z87891 Personal history of nicotine dependence: Secondary | ICD-10-CM

## 2014-02-16 DIAGNOSIS — M549 Dorsalgia, unspecified: Secondary | ICD-10-CM | POA: Diagnosis not present

## 2014-02-16 DIAGNOSIS — Z9119 Patient's noncompliance with other medical treatment and regimen: Secondary | ICD-10-CM | POA: Diagnosis present

## 2014-02-16 LAB — CBC WITH DIFFERENTIAL/PLATELET
BASOS PCT: 1 % (ref 0–1)
Basophils Absolute: 0.1 10*3/uL (ref 0.0–0.1)
Eosinophils Absolute: 0.2 10*3/uL (ref 0.0–0.7)
Eosinophils Relative: 2 % (ref 0–5)
HEMATOCRIT: 26.2 % — AB (ref 36.0–46.0)
HEMOGLOBIN: 8.9 g/dL — AB (ref 12.0–15.0)
LYMPHS PCT: 20 % (ref 12–46)
Lymphs Abs: 2.4 10*3/uL (ref 0.7–4.0)
MCH: 29.2 pg (ref 26.0–34.0)
MCHC: 34 g/dL (ref 30.0–36.0)
MCV: 85.9 fL (ref 78.0–100.0)
MONOS PCT: 13 % — AB (ref 3–12)
Monocytes Absolute: 1.6 10*3/uL — ABNORMAL HIGH (ref 0.1–1.0)
NEUTROS ABS: 7.9 10*3/uL — AB (ref 1.7–7.7)
Neutrophils Relative %: 64 % (ref 43–77)
Platelets: 557 10*3/uL — ABNORMAL HIGH (ref 150–400)
RBC: 3.05 MIL/uL — ABNORMAL LOW (ref 3.87–5.11)
RDW: 18 % — ABNORMAL HIGH (ref 11.5–15.5)
WBC: 12.2 10*3/uL — AB (ref 4.0–10.5)

## 2014-02-16 LAB — RETICULOCYTES
RBC.: 3.05 MIL/uL — ABNORMAL LOW (ref 3.87–5.11)
Retic Count, Absolute: 402.6 10*3/uL — ABNORMAL HIGH (ref 19.0–186.0)
Retic Ct Pct: 13.2 % — ABNORMAL HIGH (ref 0.4–3.1)

## 2014-02-16 LAB — BASIC METABOLIC PANEL
ANION GAP: 11 (ref 5–15)
BUN: 10 mg/dL (ref 6–23)
CHLORIDE: 104 meq/L (ref 96–112)
CO2: 24 meq/L (ref 19–32)
Calcium: 9.3 mg/dL (ref 8.4–10.5)
Creatinine, Ser: 0.71 mg/dL (ref 0.50–1.10)
GFR calc non Af Amer: >90 mL/min (ref >90)
Glucose, Bld: 105 mg/dL — ABNORMAL HIGH (ref 70–99)
Potassium: 4.5 mEq/L (ref 3.7–5.3)
Sodium: 139 mEq/L (ref 137–147)

## 2014-02-16 MED ORDER — NALOXONE HCL 0.4 MG/ML IJ SOLN: 0.4000 mg | INTRAMUSCULAR | Status: DC | PRN

## 2014-02-16 MED ORDER — KETOROLAC TROMETHAMINE 15 MG/ML IJ SOLN
15.0000 mg | Freq: Four times a day (QID) | INTRAMUSCULAR | Status: DC
  Administered 2014-02-16 – 2014-02-18 (×7): 15 mg via INTRAVENOUS
  Filled 2014-02-16 (×11): qty 1

## 2014-02-16 MED ORDER — HYDROMORPHONE HCL 1 MG/ML IJ SOLN
1.0000 mg | Freq: Once | INTRAMUSCULAR | Status: AC
  Administered 2014-02-16: 1 mg via INTRAVENOUS
  Filled 2014-02-16: qty 1

## 2014-02-16 MED ORDER — HYDROMORPHONE HCL 2 MG/ML IJ SOLN
2.0000 mg | INTRAMUSCULAR | Status: DC | PRN
  Administered 2014-02-16 (×2): 2 mg via INTRAVENOUS
  Filled 2014-02-16 (×2): qty 1

## 2014-02-16 MED ORDER — ONDANSETRON HCL 4 MG/2ML IJ SOLN
4.0000 mg | Freq: Once | INTRAMUSCULAR | Status: AC
  Administered 2014-02-16: 4 mg via INTRAVENOUS
  Filled 2014-02-16: qty 2

## 2014-02-16 MED ORDER — DIPHENHYDRAMINE HCL 50 MG/ML IJ SOLN
25.0000 mg | Freq: Once | INTRAMUSCULAR | Status: AC
  Administered 2014-02-16: 25 mg via INTRAVENOUS
  Filled 2014-02-16: qty 1

## 2014-02-16 MED ORDER — FOLIC ACID 1 MG PO TABS
1.0000 mg | ORAL_TABLET | Freq: Every day | ORAL | Status: DC
  Administered 2014-02-16 – 2014-02-25 (×10): 1 mg via ORAL
  Filled 2014-02-16 (×10): qty 1

## 2014-02-16 MED ORDER — OXYCODONE HCL 5 MG PO TABS: 10.0000 mg | ORAL_TABLET | Freq: Four times a day (QID) | ORAL | Status: DC | PRN

## 2014-02-16 MED ORDER — HYDROMORPHONE HCL 2 MG/ML IJ SOLN
2.0000 mg | INTRAMUSCULAR | Status: AC | PRN
  Administered 2014-02-16 (×3): 2 mg via INTRAVENOUS
  Filled 2014-02-16 (×3): qty 1

## 2014-02-16 MED ORDER — SODIUM CHLORIDE 0.45 % IV SOLN
INTRAVENOUS | Status: DC
  Administered 2014-02-16 – 2014-02-19 (×6): via INTRAVENOUS
  Administered 2014-02-22 – 2014-02-24 (×3): 500 mL via INTRAVENOUS

## 2014-02-16 MED ORDER — SODIUM CHLORIDE 0.9 % IV BOLUS (SEPSIS)
1000.0000 mL | Freq: Once | INTRAVENOUS | Status: AC
  Administered 2014-02-16: 1000 mL via INTRAVENOUS

## 2014-02-16 MED ORDER — ONDANSETRON HCL 4 MG/2ML IJ SOLN
4.0000 mg | Freq: Four times a day (QID) | INTRAMUSCULAR | Status: DC | PRN
  Administered 2014-02-17: 4 mg via INTRAVENOUS
  Filled 2014-02-16: qty 2

## 2014-02-16 MED ORDER — HYDROXYUREA 500 MG PO CAPS
1500.0000 mg | ORAL_CAPSULE | Freq: Every day | ORAL | Status: DC
  Administered 2014-02-18 – 2014-02-25 (×8): 1500 mg via ORAL
  Filled 2014-02-16 (×9): qty 3

## 2014-02-16 MED ORDER — ENOXAPARIN SODIUM 40 MG/0.4ML ~~LOC~~ SOLN
40.0000 mg | SUBCUTANEOUS | Status: DC
  Administered 2014-02-16 – 2014-02-23 (×4): 40 mg via SUBCUTANEOUS
  Filled 2014-02-16 (×10): qty 0.4

## 2014-02-16 MED ORDER — SODIUM CHLORIDE 0.9 % IV SOLN
25.0000 mg | INTRAVENOUS | Status: DC | PRN
  Administered 2014-02-17 (×2): 25 mg via INTRAVENOUS
  Filled 2014-02-16 (×2): qty 0.5

## 2014-02-16 MED ORDER — KETOROLAC TROMETHAMINE 30 MG/ML IJ SOLN
30.0000 mg | Freq: Once | INTRAMUSCULAR | Status: AC
  Administered 2014-02-16: 30 mg via INTRAVENOUS
  Filled 2014-02-16: qty 1

## 2014-02-16 MED ORDER — SENNOSIDES-DOCUSATE SODIUM 8.6-50 MG PO TABS
1.0000 | ORAL_TABLET | Freq: Two times a day (BID) | ORAL | Status: DC
  Administered 2014-02-16 – 2014-02-24 (×15): 1 via ORAL
  Filled 2014-02-16 (×21): qty 1

## 2014-02-16 MED ORDER — HYDROMORPHONE 0.3 MG/ML IV SOLN
INTRAVENOUS | Status: DC
  Administered 2014-02-16: 0.75 mg via INTRAVENOUS
  Administered 2014-02-16 – 2014-02-17 (×2): 3.75 mg via INTRAVENOUS
  Administered 2014-02-17: 9 mg via INTRAVENOUS
  Administered 2014-02-17: 2.95 mg via INTRAVENOUS
  Administered 2014-02-17: 10:00:00 via INTRAVENOUS
  Filled 2014-02-16 (×3): qty 25

## 2014-02-16 MED ORDER — METHADONE HCL 5 MG PO TABS
5.0000 mg | ORAL_TABLET | Freq: Every day | ORAL | Status: DC
  Administered 2014-02-16 – 2014-02-24 (×8): 5 mg via ORAL
  Filled 2014-02-16 (×9): qty 1

## 2014-02-16 MED ORDER — DIPHENHYDRAMINE HCL 25 MG PO CAPS
25.0000 mg | ORAL_CAPSULE | ORAL | Status: DC | PRN
  Filled 2014-02-16: qty 2

## 2014-02-16 MED ORDER — SODIUM CHLORIDE 0.9 % IJ SOLN: 9.0000 mL | INTRAMUSCULAR | Status: DC | PRN

## 2014-02-16 NOTE — ED Notes (Addendum)
Pt from home c/o  Bilateral arm pain from elbows down and bilateral leg pain that started to day. She reports this is a sickle cell crisis. She took oxycodone this am with out relief.

## 2014-02-16 NOTE — ED Notes (Signed)
Ice chips provided.

## 2014-02-16 NOTE — ED Provider Notes (Signed)
Complains of bilateral shoulder pain, bilateral forearm pain and chest pain typical of sickle cell crises she's had in the past. Patient seen here earlier today. She was discharged home because "I thought to control the pain on my own but I couldn't" patient appears uncomfortable alert Glasgow Coma Score 15, nontoxic lungs clear auscultation heart regular rate and rhythm no murmurs all 4 extremities no redness swelling or tenderness neurovascularly intact  Orlie Dakin, MD 02/16/14 1916

## 2014-02-16 NOTE — ED Notes (Signed)
Bed: BB40 Expected date: 02/16/14 Expected time: 6:29 PM Means of arrival: Ambulance Comments: Sickle Cell Crisis

## 2014-02-16 NOTE — ED Notes (Signed)
Pt ambulatory while dressing self and was cleaning up her trach in her room during discharge teaching. She was advised to follow up with PCP Zigmund Daniel.  Pt was wheeled to the lobby to await for mother to transport her home. Pt alert and oriented and listening to music while waiting for mother. She reports she is leaving with ALL belongings she arrived with.

## 2014-02-16 NOTE — ED Provider Notes (Signed)
CSN: [id number]     Arrival date & time 02/16/14  1836 History   First MD Initiated Contact with Patient 02/16/14 1847     Chief Complaint  Patient presents with  . Sickle Cell Pain Crisis     (Consider location/radiation/quality/duration/timing/severity/associated sxs/prior Treatment) HPI  23 year old female with history of sickle cell who presented this morning complaining of sickle cell related pain. She described pain to the base of both arms which is typical for sickle cell pain. She also reports dry mouth and feeling dehydrated. No report of fever, chest pain, shortness of breath, abdominal pain, nausea vomiting diarrhea. She rates her pain as 8/10. She was seen in the ED this morning for this complaint. She was given pain medication at that time and felt that her pain was improving. However after returning home, and taken her pain medication her pain worsened. She relates her sickle cell pain to coming off her menstrual period and with weather changes.  Past Medical History  Diagnosis Date  . H/O: 1 miscarriage 03/22/2011    Pt reports 2 miscarriages.  [name] 01/08/2009  . Depression 01/06/2011  . GERD (gastroesophageal reflux disease) 02/17/2011  . Trichotillomania     h/o  . Blood transfusion     "lots"  . Sickle cell anemia with crisis   . Exertional dyspnea     "sometimes"  . Sickle cell anemia   . Headache(784.0)   . Migraines 11/08/11    "@ least twice/month"  . Chronic back pain     "very severe; have knot in my back; from tight muscle; take RX and exercise for it"  . Mood swings 11/08/11    "I go back and forth; real bad"  . Pregnancy   . Blood dyscrasia     SICKLE CELL   Past Surgical History  Procedure Laterality Date  . Cholecystectomy  05/2010  . Dilation and curettage of uterus  02/20/11    S/P miscarriage   Family History  Problem Relation Age of Onset  . Diabetes Mother   . Alcoholism    . Depression    . Hypercholesterolemia    .  Hypertension    . Migraines    . Diabetes Maternal Grandmother   . Diabetes Paternal Grandmother    History  Substance Use Topics  . Smoking status: Former Smoker -- 0.25 packs/day for 1 years    Types: Cigarettes    Quit date: 03/25/2013  . Smokeless tobacco: Never Used  . Alcohol Use: Yes     Comment: pt states she quit marijuan in May 2013. Rare ETOH, + cigarettes.  She is enrolled in school   OB History   Grav Para Term Preterm Abortions TAB SAB Ect Mult Living   2    1  1         Obstetric Comments   Miscarried in October 2012 at about 7 weeks     Review of Systems  All other systems reviewed and are negative.     Allergies  Carrot oil and Latex  Home Medications   Prior to Admission medications   Medication Sig Start Date End Date Taking? Authorizing Provider  hydroxyurea (HYDREA) 500 MG capsule Take 1,500 mg by mouth daily. May take with food to minimize GI side effects.    Historical Provider, MD  methadone (DOLOPHINE) 5 MG tablet Take 1 tablet (5 mg total) by mouth at bedtime. 02/01/14   Elwyn Reach, MD  Oxycodone HCl 10 MG TABS Take 1.5  tablets (15 mg total) by mouth every 6 (six) hours as needed (moderate pain). 02/01/14   Elwyn Reach, MD   BP 125/92  Pulse 83  Temp(Src) 98.7 F (37.1 C) (Oral)  Resp 20  SpO2 96%  LMP 02/11/2014 Physical Exam  Nursing note and vitals reviewed. Constitutional: She appears well-developed and well-nourished. No distress.  HENT:  Head: Atraumatic.  Eyes: Conjunctivae are normal.  Neck: Neck supple.  Cardiovascular: Normal rate and regular rhythm.   Pulmonary/Chest: Effort normal and breath sounds normal.  Abdominal: Soft. There is no tenderness.  Musculoskeletal: She exhibits tenderness (diffused pain to bilateral arms left greater than right without overlying skin changes, or rash. She is neurovascularly intact in the upper extremities.).  Neurological: She is alert.  Skin: No rash noted.  Psychiatric: She has  a normal mood and affect.    ED Course  Procedures (including critical care time)  7:06 PM Patient with history of sickle cell who was seen earlier today for sickle cell related pain, discharged and return because her pain progressed. She is afebrile with stable vital sign. We'll provide pain control the patient likely benefit from admission for further management. Low suspicion for acute chest syndrome.  8:10 PM Care discussed with [facility], Dr. Cruzita Lederer who agrees to admit pt to med surg, team 8, under his care.  He also request for CXR, which i have ordered.  Care discussed with Dr. Winfred Leeds  Labs Review Labs Reviewed - No data to display CBC WITH DIFFERENTIAL - Abnormal; Notable for the following:  WBC  12.2 (*)  RBC  3.05 (*)  Hemoglobin  8.9 (*)  HCT  26.2 (*)  RDW  18.0 (*)  Platelets  557 (*)  Monocytes Relative  13 (*)  Neutro Abs  7.9 (*)  Monocytes Absolute  1.6 (*)  All other components within normal limits  BASIC METABOLIC PANEL - Abnormal; Notable for the following:  Glucose, Bld  105 (*)  All other components within normal limits  RETICULOCYTES - Abnormal; Notable for the following:  Retic Ct Pct  13.2 (*)  RBC.  3.05 (*)  Retic Count, Manual  402.6 (*)  All other components within normal limits    Imaging Review No results found.   EKG Interpretation   Date/Time:  Sunday February 16 2014 18:44:32 EDT Ventricular Rate:  89 PR Interval:  158 QRS Duration: 85 QT Interval:  362 QTC Calculation: 440 R Axis:   76 Text Interpretation:  Sinus rhythm No significant change since last  tracing Confirmed by Winfred Leeds  MD, SAM [phone]) on 02/16/2014 6:46:55 PM      MDM   Final diagnoses:  Sickle cell anemia with crisis    BP 149/102  Pulse 97  Temp(Src) 99.5 F (37.5 C) (Oral)  Resp 18  SpO2 95%  LMP 02/11/2014  I have reviewed nursing notes and vital signs. I personally reviewed the imaging tests through PACS system  I  reviewed available ER/hospitalization records thought the EMR     Domenic Moras, [place] 02/16/14 2011

## 2014-02-16 NOTE — Discharge Instructions (Signed)
Sickle Cell Anemia, Adult °Sickle cell anemia is a condition in which red blood cells have an abnormal "sickle" shape. This abnormal shape shortens the cells' life span, which results in a lower than normal concentration of red blood cells in the blood. The sickle shape also causes the cells to clump together and block free blood flow through the blood vessels. As a result, the tissues and organs of the body do not receive enough oxygen. Sickle cell anemia causes organ damage and pain and increases the risk of infection. °CAUSES  °Sickle cell anemia is a genetic disorder. Those who receive two copies of the gene have the condition, and those who receive one copy have the trait. °RISK FACTORS °The sickle cell gene is most common in people whose families originated in [place]. Other areas of the globe where sickle cell trait occurs include the Mediterranean, [place] and [place], the [place], and the [place].  °SIGNS AND SYMPTOMS °· Pain, especially in the extremities, back, chest, or abdomen (common). The pain may start suddenly or may develop following an illness, especially if there is dehydration. Pain can also occur due to overexertion or exposure to extreme temperature changes. °· Frequent severe bacterial infections, especially certain types of pneumonia and meningitis. °· Pain and swelling in the hands and feet. °· Decreased activity.   °· Loss of appetite.   °· Change in behavior. °· Headaches. °· Seizures. °· Shortness of breath or difficulty breathing. °· Vision changes. °· Skin ulcers. °Those with the trait may not have symptoms or they may have mild symptoms.  °DIAGNOSIS  °Sickle cell anemia is diagnosed with blood tests that demonstrate the genetic trait. It is often diagnosed during the newborn period, due to mandatory testing nationwide. A variety of blood tests, X-rays, CT scans, MRI scans, ultrasounds, and lung function tests may also be done to monitor the condition. °TREATMENT  °Sickle  cell anemia may be treated with: °· Medicines. You may be given pain medicines, antibiotic medicines (to treat and prevent infections) or medicines to increase the production of certain types of hemoglobin. °· Fluids. °· Oxygen. °· Blood transfusions. °HOME CARE INSTRUCTIONS  °· Drink enough fluid to keep your urine clear or pale yellow. Increase your fluid intake in hot weather and during exercise. °· Do not smoke. Smoking lowers oxygen levels in the blood.   °· Only take over-the-counter or prescription medicines for pain, fever, or discomfort as directed by your health care provider. °· Take antibiotics as directed by your health care provider. Make sure you finish them it even if you start to feel better.   °· Take supplements as directed by your health care provider.   °· Consider wearing a medical alert bracelet. This tells anyone caring for you in an emergency of your condition.   °· When traveling, keep your medical information, health care provider's names, and the medicines you take with you at all times.   °· If you develop a fever, do not take medicines to reduce the fever right away. This could cover up a problem that is developing. Notify your health care provider. °· Keep all follow-up appointments with your health care provider. Sickle cell anemia requires regular medical care. °SEEK MEDICAL CARE IF: ° You have a fever. °SEEK IMMEDIATE MEDICAL CARE IF:  °· You feel dizzy or faint.   °· You have new abdominal pain, especially on the left side near the stomach area.   °· You develop a persistent, often uncomfortable and painful penile erection (priapism). If this is not treated immediately it   will lead to impotence.   °· You have numbness your arms or legs or you have a hard time moving them.   °· You have a hard time with speech.   °· You have a fever or persistent symptoms for more than 2-3 days.   °· You have a fever and your symptoms suddenly get worse.   °· You have signs or symptoms of infection.  These include:   °¨ Chills.   °¨ Abnormal tiredness (lethargy).   °¨ Irritability.   °¨ Poor eating.   °¨ Vomiting.   °· You develop pain that is not helped with medicine.   °· You develop shortness of breath. °· You have pain in your chest.   °· You are coughing up pus-like or bloody sputum.   °· You develop a stiff neck. °· Your feet or hands swell or have pain. °· Your abdomen appears bloated. °· You develop joint pain. °MAKE SURE YOU: °· Understand these instructions. °Document Released: 08/10/2005 Document Revised: 09/16/2013 Document Reviewed: 12/12/2012 °ExitCare® Patient Information ©2015 [employer]. This information is not intended to replace advice given to you by your health care provider. Make sure you discuss any questions you have with your health care provider. ° °

## 2014-02-16 NOTE — ED Provider Notes (Signed)
CSN: [id number]     Arrival date & time 02/16/14  0710 History   First MD Initiated Contact with Patient 02/16/14 0719     Chief Complaint  Patient presents with  . Sickle Cell Pain Crisis  . Arm Pain     (Consider location/radiation/quality/duration/timing/severity/associated sxs/prior Treatment) HPI 23 year old female with sickle cell presents with recurrent sickle cell pain crisis. She states it started a couple hours ago. It woke her up from sleep. She took her oxycodone might normal at midnight and tried heat pads without any relief. She called the ambulance to uncontrolled pain. Rates the pain as about a 7/10. Basal to both arms, worse on her left. No swelling of her joints in her diffuse arm hurts. This is typical of her pain crises. No fevers. No chest pain or abdominal pain. She denies vomiting or diarrhea. Feels like her mouth is dry like she is dehydrated.  Past Medical History  Diagnosis Date  . H/O: 1 miscarriage 03/22/2011    Pt reports 2 miscarriages.  [name] 01/08/2009  . Depression 01/06/2011  . GERD (gastroesophageal reflux disease) 02/17/2011  . Trichotillomania     h/o  . Blood transfusion     "lots"  . Sickle cell anemia with crisis   . Exertional dyspnea     "sometimes"  . Sickle cell anemia   . Headache(784.0)   . Migraines 11/08/11    "@ least twice/month"  . Chronic back pain     "very severe; have knot in my back; from tight muscle; take RX and exercise for it"  . Mood swings 11/08/11    "I go back and forth; real bad"  . Pregnancy   . Blood dyscrasia     SICKLE CELL   Past Surgical History  Procedure Laterality Date  . Cholecystectomy  05/2010  . Dilation and curettage of uterus  02/20/11    S/P miscarriage   Family History  Problem Relation Age of Onset  . Diabetes Mother   . Alcoholism    . Depression    . Hypercholesterolemia    . Hypertension    . Migraines    . Diabetes Maternal Grandmother   . Diabetes Paternal Grandmother     History  Substance Use Topics  . Smoking status: Former Smoker -- 0.25 packs/day for 1 years    Types: Cigarettes    Quit date: 03/25/2013  . Smokeless tobacco: Never Used  . Alcohol Use: Yes     Comment: pt states she quit marijuan in May 2013. Rare ETOH, + cigarettes.  She is enrolled in school   OB History   Grav Para Term Preterm Abortions TAB SAB Ect Mult Living   2    1  1         Obstetric Comments   Miscarried in October 2012 at about 7 weeks     Review of Systems  Constitutional: Negative for fever.  Respiratory: Negative for shortness of breath.   Cardiovascular: Negative for chest pain.  Gastrointestinal: Negative for vomiting and abdominal pain.  Musculoskeletal: Positive for arthralgias. Negative for joint swelling.  Skin: Negative for color change.  All other systems reviewed and are negative.     Allergies  Carrot oil and Latex  Home Medications   Prior to Admission medications   Medication Sig Start Date End Date Taking? Authorizing Provider  hydroxyurea (HYDREA) 500 MG capsule Take 1,500 mg by mouth daily. May take with food to minimize GI side effects.    Historical  Provider, MD  methadone (DOLOPHINE) 5 MG tablet Take 1 tablet (5 mg total) by mouth at bedtime. 02/01/14   Elwyn Reach, MD  Oxycodone HCl 10 MG TABS Take 1.5 tablets (15 mg total) by mouth every 6 (six) hours as needed (moderate pain). 02/01/14   Elwyn Reach, MD   BP 126/69  Pulse 84  Temp(Src) 98.1 F (36.7 C) (Oral)  Resp 20  SpO2 98%  LMP 02/11/2014 Physical Exam  Nursing note and vitals reviewed. Constitutional: She is oriented to person, place, and time. She appears well-developed and well-nourished. No distress.  HENT:  Head: Normocephalic and atraumatic.  Right Ear: External ear normal.  Left Ear: External ear normal.  Nose: Nose normal.  Eyes: Right eye exhibits no discharge. Left eye exhibits no discharge.  Cardiovascular: Normal rate, regular rhythm and normal  heart sounds.   Pulmonary/Chest: Effort normal and breath sounds normal.  Abdominal: Soft. She exhibits no distension. There is no tenderness.  Musculoskeletal:  No joint swelling in upper extremities. Overall no swelling in extremities. Tender to light touch diffusely over right and left arm without any focal area of pain  Neurological: She is alert and oriented to person, place, and time.  Skin: Skin is warm and dry. She is not diaphoretic.    ED Course  Procedures (including critical care time) Labs Review Labs Reviewed  CBC WITH DIFFERENTIAL - Abnormal; Notable for the following:    WBC 12.2 (*)    RBC 3.05 (*)    Hemoglobin 8.9 (*)    HCT 26.2 (*)    RDW 18.0 (*)    Platelets 557 (*)    Monocytes Relative 13 (*)    Neutro Abs 7.9 (*)    Monocytes Absolute 1.6 (*)    All other components within normal limits  BASIC METABOLIC PANEL - Abnormal; Notable for the following:    Glucose, Bld 105 (*)    All other components within normal limits  RETICULOCYTES - Abnormal; Notable for the following:    Retic Ct Pct 13.2 (*)    RBC. 3.05 (*)    Retic Count, Manual 402.6 (*)    All other components within normal limits    Imaging Review No results found.   EKG Interpretation None      MDM   Final diagnoses:  Sickle cell pain crisis    Patient feels significantly better with IV pain medicine and fluids. Her anemia is similar to prior. This point there no fevers or other red flags to be concerned with a different source of her pain. She would like to be discharge, will have her followup with her PCP.    Ephraim Hamburger, MD 02/16/14 1041

## 2014-02-16 NOTE — ED Notes (Signed)
Per EMS, pt from home.  Pt was here this morning for sickle cell and states she felt better upon d/c home.  Pt states pain has returned and her vicodin and oxycodone are not getting rid of pain.  Pt returned.  Vitals:130/80, hr90, resp 22

## 2014-02-16 NOTE — H&P (Signed)
History and Physical    [name] RJJ:[id number] DOB: Nov 07, 1990 DOA: 02/16/2014  Referring physician: Dr. Domenic Moras, PA PCP: MATTHEWS,MICHELLE A., MD  Specialists: none   Chief Complaint: bilateral arm pain  HPI: [name] is a 23 y.o. female has a past medical history significant for sickle cell disease presents to the ED with bilateral arm and shoulder pain. She was just seen this morning, received IV hydration and pain medications, felt better and was discharged home. Throughout the day, however, despite taking her home doses of pain medication she felt like her pain is increasingly severe and decided to come back to the ER. She denies any fevers or chills, denies any abdominal pain, nausea or vomiting, and states that her pain is typical of her crises. She is afebrile in the ED, has mild leukocytosis which is her baseline. A CXR is pending however she has no respiratory symptoms and is not hypoxic. [facility] asked for admission for sickle cell crisis.  Review of Systems: as per HPI otherwise negative  Past Medical History  Diagnosis Date  . H/O: 1 miscarriage 03/22/2011    Pt reports 2 miscarriages.  [name] 01/08/2009  . Depression 01/06/2011  . GERD (gastroesophageal reflux disease) 02/17/2011  . Trichotillomania     h/o  . Blood transfusion     "lots"  . Sickle cell anemia with crisis   . Exertional dyspnea     "sometimes"  . Sickle cell anemia   . Headache(784.0)   . Migraines 11/08/11    "@ least twice/month"  . Chronic back pain     "very severe; have knot in my back; from tight muscle; take RX and exercise for it"  . Mood swings 11/08/11    "I go back and forth; real bad"  . Pregnancy   . Blood dyscrasia     SICKLE CELL   Past Surgical History  Procedure Laterality Date  . Cholecystectomy  05/2010  . Dilation and curettage of uterus  02/20/11    S/P miscarriage   Social History:  reports that she quit smoking about 10 months ago. Her smoking  use included Cigarettes. She has a .25 pack-year smoking history. She has never used smokeless tobacco. She reports that she drinks alcohol. She reports that she does not use illicit drugs.  Allergies  Allergen Reactions  . Carrot Oil Hives and Swelling  . Latex Rash    Family History  Problem Relation Age of Onset  . Diabetes Mother   . Alcoholism    . Depression    . Hypercholesterolemia    . Hypertension    . Migraines    . Diabetes Maternal Grandmother   . Diabetes Paternal Grandmother    Prior to Admission medications   Medication Sig Start Date End Date Taking? Authorizing Provider  hydroxyurea (HYDREA) 500 MG capsule Take 1,500 mg by mouth daily. May take with food to minimize GI side effects.   Yes Historical Provider, MD  methadone (DOLOPHINE) 5 MG tablet Take 1 tablet (5 mg total) by mouth at bedtime. 02/01/14  Yes Elwyn Reach, MD  Oxycodone HCl 10 MG TABS Take 1.5 tablets (15 mg total) by mouth every 6 (six) hours as needed (moderate pain). 02/01/14  Yes Elwyn Reach, MD   Physical Exam: Filed Vitals:   02/16/14 1838 02/16/14 1943  BP: 125/92 149/102  Pulse: 83 97  Temp: 98.7 F (37.1 C) 99.5 F (37.5 C)  TempSrc: Oral Oral  Resp: 20  18  SpO2: 96% 95%     General:  No apparent distress  Eyes: no scleral icterus  ENT: moist oropharynx  Cardiovascular: regular rate without MRG; 2+ peripheral pulses  Respiratory: CTA biL, good air movement without wheezing  Abdomen: soft, non tender to palpation  Skin: no rashes  Musculoskeletal: no peripheral edema, pain on upper extremities  Psychiatric: normal mood and affect  Neurologic: non focal  Labs on Admission:  Basic Metabolic Panel:  Recent Labs Lab 02/16/14 0759  NA 139  K 4.5  CL 104  CO2 24  GLUCOSE 105*  BUN 10  CREATININE 0.71  CALCIUM 9.3   CBC:  Recent Labs Lab 02/16/14 0759  WBC 12.2*  NEUTROABS 7.9*  HGB 8.9*  HCT 26.2*  MCV 85.9  PLT 557*   Radiological Exams on  Admission: No results found.  EKG: Independently reviewed.  Assessment/Plan Active Problems:   Sickle cell disease   Leukocytosis   Sickle cell anemia with crisis   Sickle cell crisis   #1 Sickle cell crisis - start IV hydration with half normal saline - start custom dose PCA based on her previous hospitalization dose requirements - start home oral analgesics - no clinical evidence of acute chest syndrome, CXR pending  #2 Chronic anemia - due to #1, Hemoglobin at baseline  #3 Leukocytosis - reactive due to vaso-occlusive crisis    Diet: regular  Fluids: 1/2 NS at 125 cc/h DVT Prophylaxis: Lovenox   Code Status: Full  Family Communication: none  Disposition Plan: admit to MedSurg  Time spent: 35  Kayode Petion M. Cruzita Lederer, MD [facility] Pager [phone]  If 7PM-7AM, please contact night-coverage [email or web address] Password [email or web address] 02/16/2014, 8:24 PM

## 2014-02-16 NOTE — ED Notes (Signed)
Bed: JT70 Expected date: 02/16/14 Expected time: 7:02 AM Means of arrival: Ambulance Comments: Bil arm pain, sickle cell pain

## 2014-02-16 NOTE — ED Notes (Signed)
MD Goldston at bedside  

## 2014-02-16 NOTE — ED Provider Notes (Signed)
Medical screening examination/treatment/procedure(s) were conducted as a shared visit with non-physician practitioner(s) and myself.  I personally evaluated the patient during the encounter.   EKG Interpretation   Date/Time:  Sunday February 16 2014 18:44:32 EDT Ventricular Rate:  89 PR Interval:  158 QRS Duration: 85 QT Interval:  362 QTC Calculation: 440 R Axis:   76 Text Interpretation:  Sinus rhythm No significant change since last  tracing Confirmed by Winfred Leeds  MD, Lunden Stieber [phone]) on 02/16/2014 6:46:55 PM       Orlie Dakin, MD 02/16/14 2352

## 2014-02-16 NOTE — ED Notes (Signed)
Graham crackers, water, and peanut butter provided to the patient per request.

## 2014-02-16 NOTE — ED Notes (Signed)
Pt requesting zofran and benadryl.

## 2014-02-17 DIAGNOSIS — G8929 Other chronic pain: Secondary | ICD-10-CM

## 2014-02-17 DIAGNOSIS — D72829 Elevated white blood cell count, unspecified: Secondary | ICD-10-CM

## 2014-02-17 DIAGNOSIS — M549 Dorsalgia, unspecified: Secondary | ICD-10-CM

## 2014-02-17 LAB — CBC WITH DIFFERENTIAL/PLATELET
BASOS ABS: 0.1 10*3/uL (ref 0.0–0.1)
Basophils Relative: 1 % (ref 0–1)
EOS ABS: 0.4 10*3/uL (ref 0.0–0.7)
Eosinophils Relative: 3 % (ref 0–5)
HCT: 23.5 % — ABNORMAL LOW (ref 36.0–46.0)
HEMOGLOBIN: 8.1 g/dL — AB (ref 12.0–15.0)
LYMPHS PCT: 36 % (ref 12–46)
Lymphs Abs: 5.4 10*3/uL — ABNORMAL HIGH (ref 0.7–4.0)
MCH: 29.7 pg (ref 26.0–34.0)
MCHC: 34.5 g/dL (ref 30.0–36.0)
MCV: 86.1 fL (ref 78.0–100.0)
Monocytes Absolute: 1.8 10*3/uL — ABNORMAL HIGH (ref 0.1–1.0)
Monocytes Relative: 12 % (ref 3–12)
NEUTROS PCT: 48 % (ref 43–77)
Neutro Abs: 7.2 10*3/uL (ref 1.7–7.7)
PLATELETS: 489 10*3/uL — AB (ref 150–400)
RBC: 2.73 MIL/uL — ABNORMAL LOW (ref 3.87–5.11)
RDW: 18.6 % — AB (ref 11.5–15.5)
WBC: 14.9 10*3/uL — ABNORMAL HIGH (ref 4.0–10.5)

## 2014-02-17 LAB — COMPREHENSIVE METABOLIC PANEL
ALBUMIN: 3.6 g/dL (ref 3.5–5.2)
ALT: 15 U/L (ref 0–35)
AST: 26 U/L (ref 0–37)
Alkaline Phosphatase: 55 U/L (ref 39–117)
Anion gap: 13 (ref 5–15)
BUN: 8 mg/dL (ref 6–23)
CO2: 23 mEq/L (ref 19–32)
Calcium: 8.6 mg/dL (ref 8.4–10.5)
Chloride: 102 mEq/L (ref 96–112)
Creatinine, Ser: 0.67 mg/dL (ref 0.50–1.10)
GFR calc Af Amer: >90 mL/min (ref >90)
GFR calc non Af Amer: >90 mL/min (ref >90)
Glucose, Bld: 105 mg/dL — ABNORMAL HIGH (ref 70–99)
Potassium: 4 mEq/L (ref 3.7–5.3)
SODIUM: 138 meq/L (ref 137–147)
TOTAL PROTEIN: 7 g/dL (ref 6.0–8.3)
Total Bilirubin: 1.8 mg/dL — ABNORMAL HIGH (ref 0.3–1.2)

## 2014-02-17 MED ORDER — HYDROMORPHONE 2 MG/ML HIGH CONCENTRATION IV PCA SOLN
INTRAVENOUS | Status: DC
  Administered 2014-02-17: 2 mg via INTRAVENOUS
  Administered 2014-02-17: 11 mg via INTRAVENOUS
  Administered 2014-02-17: 14:00:00 via INTRAVENOUS
  Administered 2014-02-18: 6 mg via INTRAVENOUS
  Administered 2014-02-18: 1 mg via INTRAVENOUS
  Administered 2014-02-18: 3 mg via INTRAVENOUS
  Administered 2014-02-18: 7 mg via INTRAVENOUS
  Filled 2014-02-17: qty 25

## 2014-02-17 NOTE — Progress Notes (Signed)
Subjective: A 23 yo woman admitted yesterday with sickle cell painful crisis. She was placed on regular strength dilaudid PCA and is still having 9/10 pain. She has been having pain in her legs and back. No fever, no chills. Has used 9.65 mg overnight. Was seen in the ER twice yesterday. She left thinking she could control her pain at home but had to return in the evening and was admitted. Patient works at [employer] and is constantly at the customer's window were it is too cold for her and may be triggering her attacks. She wants a note that will exempt her from that duty at work.  Objective: Vital signs in last 24 hours: Temp:  [98.1 F (36.7 C)-99.5 F (37.5 C)] 98.3 F (36.8 C) (10/05 1126) Pulse Rate:  [80-99] 96 (10/05 1126) Resp:  [10-20] 14 (10/05 1126) BP: (112-149)/(69-102) 115/75 mmHg (10/05 1126) SpO2:  [94 %-98 %] 98 % (10/05 1126) Weight:  [76.975 kg (169 lb 11.2 oz)] 76.975 kg (169 lb 11.2 oz) (10/04 2106) Weight change:  Last BM Date: 02/16/14  Intake/Output from previous day: 10/04 0701 - 10/05 0700 In: 954.2 [I.V.:904.2; IV Piggyback:50] Out: 750 [Urine:750] Intake/Output this shift:    General appearance: alert, cooperative and no distress Eyes: conjunctivae/corneas clear. PERRL, EOM's intact. Fundi benign. Ears: normal TM's and external ear canals both ears Neck: no adenopathy, no carotid bruit, no JVD, supple, symmetrical, trachea midline and thyroid not enlarged, symmetric, no tenderness/mass/nodules Back: symmetric, no curvature. ROM normal. No CVA tenderness. Resp: clear to auscultation bilaterally Chest wall: no tenderness Cardio: regular rate and rhythm, S1, S2 normal, no murmur, click, rub or gallop GI: soft, non-tender; bowel sounds normal; no masses,  no organomegaly Extremities: extremities normal, atraumatic, no cyanosis or edema Pulses: 2+ and symmetric Skin: Skin color, texture, turgor normal. No rashes or lesions Neurologic: Grossly normal  Lab  Results:  Recent Labs  02/16/14 0759 02/17/14 0424  WBC 12.2* 14.9*  HGB 8.9* 8.1*  HCT 26.2* 23.5*  PLT 557* 489*   BMET  Recent Labs  02/16/14 0759 02/17/14 0424  NA 139 138  K 4.5 4.0  CL 104 102  CO2 24 23  GLUCOSE 105* 105*  BUN 10 8  CREATININE 0.71 0.67  CALCIUM 9.3 8.6    Studies/Results: Dg Chest 2 View  02/16/2014   CLINICAL DATA:  Sickle cell crisis.  Nonsmoker.  EXAM: CHEST  2 VIEW  COMPARISON:  01/29/2014  FINDINGS: The heart size and mediastinal contours are within normal limits. Both lungs are clear. Biconcave vertebral bodies compatible with known sickle cell disease. Right upper quadrant surgical clips.  IMPRESSION: No active cardiopulmonary disease.   Electronically Signed   By: Marin Olp M.D.   On: 02/16/2014 20:54    Medications: I have reviewed the patient's current medications.  Assessment/Plan: A 23 yo with sickle cell disease admitted with sickle cell painful crisis.  #1 Sickle Cell Painful crisis: Patient is on low concentration Dilaudid PCA. Will switch her to the High concentration Dilaudid PCA and Toradol. Will monitor her response and if needed, will use physician assisted dose to control her pain.  #2 Sickle Cell Anemia: No evedence of significant hemolysis. Continue to monitor H/H  #3 Leucocytosis: Due to VOC. Continue to monitor.  #4 Dehydration: Continue hydration    LOS: 1 day   Jaime Dome,LAWAL 02/17/2014, 11:39 AM

## 2014-02-18 DIAGNOSIS — K5900 Constipation, unspecified: Secondary | ICD-10-CM

## 2014-02-18 LAB — COMPREHENSIVE METABOLIC PANEL
ALK PHOS: 52 U/L (ref 39–117)
ALT: 14 U/L (ref 0–35)
AST: 18 U/L (ref 0–37)
Albumin: 3.7 g/dL (ref 3.5–5.2)
Anion gap: 11 (ref 5–15)
BILIRUBIN TOTAL: 2.4 mg/dL — AB (ref 0.3–1.2)
BUN: 5 mg/dL — ABNORMAL LOW (ref 6–23)
CO2: 23 meq/L (ref 19–32)
CREATININE: 0.62 mg/dL (ref 0.50–1.10)
Calcium: 8.8 mg/dL (ref 8.4–10.5)
Chloride: 101 mEq/L (ref 96–112)
GFR calc Af Amer: >90 mL/min (ref >90)
Glucose, Bld: 95 mg/dL (ref 70–99)
POTASSIUM: 4.1 meq/L (ref 3.7–5.3)
Sodium: 135 mEq/L — ABNORMAL LOW (ref 137–147)
Total Protein: 6.9 g/dL (ref 6.0–8.3)

## 2014-02-18 LAB — CBC WITH DIFFERENTIAL/PLATELET
Basophils Absolute: 0 10*3/uL (ref 0.0–0.1)
Basophils Relative: 0 % (ref 0–1)
EOS PCT: 2 % (ref 0–5)
Eosinophils Absolute: 0.4 10*3/uL (ref 0.0–0.7)
HEMATOCRIT: 22.4 % — AB (ref 36.0–46.0)
HEMOGLOBIN: 8 g/dL — AB (ref 12.0–15.0)
Lymphocytes Relative: 12 % (ref 12–46)
Lymphs Abs: 2.2 10*3/uL (ref 0.7–4.0)
MCH: 30.4 pg (ref 26.0–34.0)
MCHC: 35.7 g/dL (ref 30.0–36.0)
MCV: 85.2 fL (ref 78.0–100.0)
MONO ABS: 1.6 10*3/uL — AB (ref 0.1–1.0)
Monocytes Relative: 9 % (ref 3–12)
NRBC: 1 /100{WBCs} — AB
Neutro Abs: 13.9 10*3/uL — ABNORMAL HIGH (ref 1.7–7.7)
Neutrophils Relative %: 77 % (ref 43–77)
Platelets: 524 10*3/uL — ABNORMAL HIGH (ref 150–400)
RBC: 2.63 MIL/uL — ABNORMAL LOW (ref 3.87–5.11)
RDW: 20.1 % — ABNORMAL HIGH (ref 11.5–15.5)
WBC: 18.1 10*3/uL — ABNORMAL HIGH (ref 4.0–10.5)

## 2014-02-18 MED ORDER — HYDROMORPHONE 2 MG/ML HIGH CONCENTRATION IV PCA SOLN
INTRAVENOUS | Status: DC
  Administered 2014-02-18: 4.8 mg via INTRAVENOUS
  Administered 2014-02-18: 22:00:00 via INTRAVENOUS
  Administered 2014-02-19: 11.5 mg via INTRAVENOUS
  Administered 2014-02-19: 7.3 mg via INTRAVENOUS
  Administered 2014-02-19: 10.32 mg via INTRAVENOUS
  Administered 2014-02-19: 8.21 mg via INTRAVENOUS
  Administered 2014-02-19: 6.17 mg via INTRAVENOUS
  Administered 2014-02-20: 4.8 mg via INTRAVENOUS
  Administered 2014-02-20: 3.19 mg via INTRAVENOUS
  Administered 2014-02-20: 4.04 mg via INTRAVENOUS
  Administered 2014-02-20: 09:00:00 via INTRAVENOUS
  Administered 2014-02-20: 9.8 mg via INTRAVENOUS
  Administered 2014-02-20: 4.19 mg via INTRAVENOUS
  Administered 2014-02-21: 1.89 mg via INTRAVENOUS
  Administered 2014-02-21: 4.18 mg via INTRAVENOUS
  Administered 2014-02-21: 4.54 mg via INTRAVENOUS
  Filled 2014-02-18: qty 25

## 2014-02-18 MED ORDER — DIPHENHYDRAMINE HCL 25 MG PO CAPS: 25.0000 mg | ORAL_CAPSULE | Freq: Four times a day (QID) | ORAL | Status: DC | PRN

## 2014-02-18 MED ORDER — INFLUENZA VAC SPLIT QUAD 0.5 ML IM SUSY
0.5000 mL | PREFILLED_SYRINGE | INTRAMUSCULAR | Status: AC
  Administered 2014-02-19: 0.5 mL via INTRAMUSCULAR
  Filled 2014-02-18 (×2): qty 0.5

## 2014-02-18 MED ORDER — HYDROMORPHONE 2 MG/ML HIGH CONCENTRATION IV PCA SOLN
INTRAVENOUS | Status: DC
  Administered 2014-02-18: 9.8 mg via INTRAVENOUS
  Filled 2014-02-18: qty 25

## 2014-02-18 MED ORDER — POLYETHYLENE GLYCOL 3350 17 G PO PACK
17.0000 g | PACK | Freq: Every day | ORAL | Status: DC
  Administered 2014-02-18 – 2014-02-24 (×7): 17 g via ORAL
  Filled 2014-02-18 (×8): qty 1

## 2014-02-18 MED ORDER — KETOROLAC TROMETHAMINE 15 MG/ML IJ SOLN
30.0000 mg | Freq: Four times a day (QID) | INTRAMUSCULAR | Status: DC
  Administered 2014-02-18 – 2014-02-19 (×3): 30 mg via INTRAVENOUS
  Filled 2014-02-18 (×5): qty 2

## 2014-02-18 NOTE — Progress Notes (Signed)
SICKLE CELL SERVICE PROGRESS NOTE  [name] URK:[id number] DOB: Aug 21, 1990 DOA: 02/16/2014 PCP: Susanna Benge A., MD  Assessment/Plan: Active Problems:   Sickle cell disease   Leukocytosis   Sickle cell anemia with crisis   Sickle cell crisis  1. Hb SS with crisis: Pt having pain mostly localized to right thigh.She rates pain as 8/10 currently. PCA use 33 mg 37/34 Demands/Deliveries. Will continue PCA,  decrease PCA dose to 0.7 mg, continue Toradol and increase to 30 mg and decrease IVF to [facility]. Will re-assess tomorrow. Likely give a basal rate overnight. 2. Constipation:  Will add Miralax to Senna-S.  3. Sickle Cell Disease: Pt has not been compliant with Hydrea. She admits to non-compliance without any reason. I will address further in clinic and patient will be required to keep a daily Journal re: Sickle Cell Disease and management of self and disease.   Code Status: full code Family Communication: N/A Disposition Plan: Not yet ready for discharge  [place].  Pager [phone]. If 7PM-7AM, please contact night-coverage.  02/18/2014, 11:42 AM  LOS: 2 days   Initial H&P: [name] is a 23 y.o. female has a past medical history significant for sickle cell disease presents to the ED with bilateral arm and shoulder pain. She was just seen this morning, received IV hydration and pain medications, felt better and was discharged home. Throughout the day, however, despite taking her home doses of pain medication she felt like her pain is increasingly severe and decided to come back to the ER. She denies any fevers or chills, denies any abdominal pain, nausea or vomiting, and states that her pain is typical of her crises. She is afebrile in the ED, has mild leukocytosis which is her baseline. A CXR is pending however she has no respiratory symptoms and is not hypoxic. TRH asked for admission for sickle cell  crisis.   Consultants:  None  Procedures:  None  Antibiotics:  None  HPI/Subjective:  Pt having pain mostly localized to right thigh.She rates pain as 8/10 currently and describes as throbbing consistent with the character of her usual pain of sickle cell crisis. Last BM 2 days ago.  Objective: Filed Vitals:   02/18/14 0418 02/18/14 0555 02/18/14 0804 02/18/14 0930  BP:  107/62  113/76  Pulse:  93  93  Temp:  98.5 F (36.9 C)  98.3 F (36.8 C)  TempSrc:  Oral  Oral  Resp: 16 16  17   Height:      Weight:      SpO2: 94% 98% 96% 98%   Weight change:   Intake/Output Summary (Last 24 hours) at 02/18/14 1142 Last data filed at 02/18/14 0900  Gross per 24 hour  Intake      0 ml  Output   1300 ml  Net  -1300 ml    General: Alert, awake, oriented x3, in no acute distress.  HEENT: /AT PEERL, EOMI, anicteric Neck: Trachea midline,  no masses, no thyromegal,y no JVD, no carotid bruit OROPHARYNX:  Moist, No exudate/ erythema/lesions.  Heart: Regular rate and rhythm, without murmurs, rubs, gallops, PMI non-displaced, no heaves or thrills on palpation.  Lungs: Clear to auscultation, no wheezing or rhonchi noted. No increased vocal fremitus resonant to percussion  Abdomen: Soft, nontender, nondistended, positive bowel sounds, no masses no hepatosplenomegaly noted..  Neuro: No focal neurological deficits noted cranial nerves II through XII grossly intact.  Strength functional in bilateral upper and lower extremities. Musculoskeletal: No warm swelling or erythema  around joints, no spinal tenderness noted. Psychiatric: Patient alert and oriented x3, good insight and cognition, good recent to remote recall. Lymph node survey: No cervical axillary or inguinal lymphadenopathy noted.   Data Reviewed: Basic Metabolic Panel:  Recent Labs Lab 02/16/14 0759 02/17/14 0424 02/18/14 0630  NA 139 138 135*  K 4.5 4.0 4.1  CL 104 102 101  CO2 24 23 23   GLUCOSE 105* 105* 95  BUN  10 8 5*  CREATININE 0.71 0.67 0.62  CALCIUM 9.3 8.6 8.8   Liver Function Tests:  Recent Labs Lab 02/17/14 0424 02/18/14 0630  AST 26 18  ALT 15 14  ALKPHOS 55 52  BILITOT 1.8* 2.4*  PROT 7.0 6.9  ALBUMIN 3.6 3.7   No results found for this basename: LIPASE, AMYLASE,  in the last 168 hours No results found for this basename: AMMONIA,  in the last 168 hours CBC:  Recent Labs Lab 02/16/14 0759 02/17/14 0424 02/18/14 0630  WBC 12.2* 14.9* 18.1*  NEUTROABS 7.9* 7.2 13.9*  HGB 8.9* 8.1* 8.0*  HCT 26.2* 23.5* 22.4*  MCV 85.9 86.1 85.2  PLT 557* 489* 524*   Cardiac Enzymes: No results found for this basename: CKTOTAL, CKMB, CKMBINDEX, TROPONINI,  in the last 168 hours BNP (last 3 results) No results found for this basename: PROBNP,  in the last 8760 hours CBG: No results found for this basename: GLUCAP,  in the last 168 hours  No results found for this or any previous visit (from the past 240 hour(s)).   Studies: Dg Chest 2 View  02/16/2014   CLINICAL DATA:  Sickle cell crisis.  Nonsmoker.  EXAM: CHEST  2 VIEW  COMPARISON:  01/29/2014  FINDINGS: The heart size and mediastinal contours are within normal limits. Both lungs are clear. Biconcave vertebral bodies compatible with known sickle cell disease. Right upper quadrant surgical clips.  IMPRESSION: No active cardiopulmonary disease.   Electronically Signed   By: Marin Olp M.D.   On: 02/16/2014 20:54   Dg Chest 2 View  01/29/2014   CLINICAL DATA:  Sickle cell crisis.  Chest pain.  EXAM: CHEST  2 VIEW  COMPARISON:  Chest radiograph performed 01/13/2014  FINDINGS: The lungs are well-aerated and clear. There is no evidence of focal opacification, pleural effusion or pneumothorax.  The heart is normal in size; the mediastinal contour is within normal limits. No acute osseous abnormalities are seen. H-shaped vertebral bodies are compatible with the patient's known sickle cell disease. Clips are noted within the right upper  quadrant, reflecting prior cholecystectomy.  IMPRESSION: No acute cardiopulmonary process seen.   Electronically Signed   By: Garald Balding M.D.   On: 01/29/2014 01:12   Dg Knee Complete 4 Views Left  01/29/2014   CLINICAL DATA:  Left knee pain. No injury. History of sickle cell disease.  EXAM: LEFT KNEE - COMPLETE 4+ VIEW  COMPARISON:  05/28/2013  FINDINGS: Subtle overall increased bone density compatible with history of sickle cell disease. No evidence of fracture, dislocation or joint effusion. Subtle stable sclerosis over the medial femoral condyle.  IMPRESSION: No acute findings.   Electronically Signed   By: Marin Olp M.D.   On: 01/29/2014 11:57    Scheduled Meds: . enoxaparin (LOVENOX) injection  40 mg Subcutaneous Q24H  . folic acid  1 mg Oral Daily  . HYDROmorphone PCA 2 mg/mL   Intravenous 6 times per day  . hydroxyurea  1,500 mg Oral Daily  . [START ON 02/19/2014] Influenza vac  split quadrivalent PF  0.5 mL Intramuscular Tomorrow-1000  . ketorolac  30 mg Intravenous 4 times per day  . methadone  5 mg Oral QHS  . polyethylene glycol  17 g Oral Daily  . senna-docusate  1 tablet Oral BID   Continuous Infusions: . sodium chloride 125 mL/hr at 02/18/14 0804    Total time spent 37 minutes.

## 2014-02-19 DIAGNOSIS — D5700 Hb-SS disease with crisis, unspecified: Secondary | ICD-10-CM | POA: Diagnosis not present

## 2014-02-19 LAB — CBC WITH DIFFERENTIAL/PLATELET
BASOS PCT: 0 % (ref 0–1)
Basophils Absolute: 0 10*3/uL (ref 0.0–0.1)
Eosinophils Absolute: 0.5 10*3/uL (ref 0.0–0.7)
Eosinophils Relative: 3 % (ref 0–5)
HEMATOCRIT: 23.2 % — AB (ref 36.0–46.0)
HEMOGLOBIN: 8 g/dL — AB (ref 12.0–15.0)
Lymphocytes Relative: 17 % (ref 12–46)
Lymphs Abs: 2.8 10*3/uL (ref 0.7–4.0)
MCH: 29.3 pg (ref 26.0–34.0)
MCHC: 34.5 g/dL (ref 30.0–36.0)
MCV: 85 fL (ref 78.0–100.0)
Monocytes Absolute: 2 10*3/uL — ABNORMAL HIGH (ref 0.1–1.0)
Monocytes Relative: 12 % (ref 3–12)
NEUTROS ABS: 11.3 10*3/uL — AB (ref 1.7–7.7)
Neutrophils Relative %: 68 % (ref 43–77)
Platelets: 573 10*3/uL — ABNORMAL HIGH (ref 150–400)
RBC: 2.73 MIL/uL — ABNORMAL LOW (ref 3.87–5.11)
RDW: 21.1 % — AB (ref 11.5–15.5)
WBC: 16.6 10*3/uL — ABNORMAL HIGH (ref 4.0–10.5)

## 2014-02-19 LAB — BASIC METABOLIC PANEL
Anion gap: 10 (ref 5–15)
BUN: 6 mg/dL (ref 6–23)
CALCIUM: 9.1 mg/dL (ref 8.4–10.5)
CO2: 30 meq/L (ref 19–32)
Chloride: 98 mEq/L (ref 96–112)
Creatinine, Ser: 0.75 mg/dL (ref 0.50–1.10)
GFR calc Af Amer: >90 mL/min (ref >90)
GFR calc non Af Amer: >90 mL/min (ref >90)
GLUCOSE: 94 mg/dL (ref 70–99)
Potassium: 3.9 mEq/L (ref 3.7–5.3)
SODIUM: 138 meq/L (ref 137–147)

## 2014-02-19 LAB — LACTATE DEHYDROGENASE: LDH: 323 U/L — ABNORMAL HIGH (ref 94–250)

## 2014-02-19 MED ORDER — KETOROLAC TROMETHAMINE 30 MG/ML IJ SOLN
30.0000 mg | Freq: Four times a day (QID) | INTRAMUSCULAR | Status: AC
  Administered 2014-02-19 – 2014-02-21 (×10): 30 mg via INTRAVENOUS
  Filled 2014-02-19 (×8): qty 1

## 2014-02-19 MED ORDER — HYDROMORPHONE HCL 2 MG/ML IJ SOLN
2.0000 mg | Freq: Once | INTRAMUSCULAR | Status: AC
  Administered 2014-02-19: 2 mg via INTRAVENOUS
  Filled 2014-02-19: qty 1

## 2014-02-19 MED ORDER — HYDROMORPHONE HCL 2 MG/ML IJ SOLN
2.0000 mg | INTRAMUSCULAR | Status: DC | PRN
  Administered 2014-02-19 – 2014-02-22 (×17): 2 mg via INTRAVENOUS
  Filled 2014-02-19 (×19): qty 1

## 2014-02-19 NOTE — Progress Notes (Signed)
SICKLE CELL SERVICE PROGRESS NOTE  [name] GYJ:[id number] DOB: Sep 05, 1990 DOA: 02/16/2014 PCP: MATTHEWS,MICHELLE A., MD  Assessment/Plan: Active Problems:   Sickle cell disease   Leukocytosis   Sickle cell anemia with crisis   Sickle cell crisis  1. Hb SS with crisis: Pt having pain mostly localized to right thigh which is likely an infarct to the bone.Will resume basal dose on PCA and continue current PCA bolus dose of 0.7 mg, continue Toradol at 30 mg and decrease IVF to [facility]. Add clinician assisted doses for breakthrough pain. Will re-assess tomorrow.  2. Constipation:  Continue Miralax to Senna-S.  3. Sickle Cell Disease: Pt has not been compliant with Hydrea. She admits to non-compliance without any reason. I will address further in clinic and patient will be required to keep a daily Journal re: Sickle Cell Disease and management of self and disease.   Code Status: full code Family Communication: N/A Disposition Plan: Not yet ready for discharge  [place].  Pager [phone]. If 7PM-7AM, please contact night-coverage.  02/19/2014, 7:38 PM  LOS: 3 days   Initial H&P: [name] is a 23 y.o. female has a past medical history significant for sickle cell disease presents to the ED with bilateral arm and shoulder pain. She was just seen this morning, received IV hydration and pain medications, felt better and was discharged home. Throughout the day, however, despite taking her home doses of pain medication she felt like her pain is increasingly severe and decided to come back to the ER. She denies any fevers or chills, denies any abdominal pain, nausea or vomiting, and states that her pain is typical of her crises. She is afebrile in the ED, has mild leukocytosis which is her baseline. A CXR is pending however she has no respiratory symptoms and is not hypoxic. TRH asked for admission for sickle cell  crisis.   Consultants:  None  Procedures:  None  Antibiotics:  None  HPI/Subjective:  Pt having pain mostly localized to right thigh.She si crying and rates pain as 8/10 currently and describes as throbbing consistent with the character of her usual pain of sickle cell crisis. Last BM 3 days ago.  Objective: Filed Vitals:   02/18/14 2046 02/19/14 0155 02/19/14 0400 02/19/14 0515  BP: 112/81 111/69  101/66  Pulse: 100 88  78  Temp: 98.3 F (36.8 C) 98.7 F (37.1 C)  97.4 F (36.3 C)  TempSrc: Oral Oral  Oral  Resp: 16 15 16 18   Height:      Weight:      SpO2: 94% 98% 95% 98%   Weight change:   Intake/Output Summary (Last 24 hours) at 02/19/14 1938 Last data filed at 02/19/14 1331  Gross per 24 hour  Intake    480 ml  Output    400 ml  Net     80 ml    General: Alert, awake, oriented x3, in no acute distress.  HEENT: [place]/AT PEERL, EOMI, anicteric Neck: Trachea midline,  no masses, no thyromegal,y no JVD, no carotid bruit OROPHARYNX:  Moist, No exudate/ erythema/lesions.  Heart: Regular rate and rhythm, without murmurs, rubs, gallops, PMI non-displaced, no heaves or thrills on palpation.  Lungs: Clear to auscultation, no wheezing or rhonchi noted. No increased vocal fremitus resonant to percussion  Abdomen: Soft, nontender, nondistended, positive bowel sounds, no masses no hepatosplenomegaly noted..  Neuro: No focal neurological deficits noted cranial nerves II through XII grossly intact.  Strength functional in bilateral upper and  lower extremities. Musculoskeletal: Tenderness noted at right posterior thigh and knee. Psychiatric: Patient alert and oriented x3, good insight and cognition, good recent to remote recall. Lymph node survey: No cervical axillary or inguinal lymphadenopathy noted.   Data Reviewed: Basic Metabolic Panel:  Recent Labs Lab 02/16/14 0759 02/17/14 0424 02/18/14 0630 02/19/14 0350  NA 139 138 135* 138  K 4.5 4.0 4.1 3.9  CL 104 102  101 98  CO2 24 23 23 30   GLUCOSE 105* 105* 95 94  BUN 10 8 5* 6  CREATININE 0.71 0.67 0.62 0.75  CALCIUM 9.3 8.6 8.8 9.1   Liver Function Tests:  Recent Labs Lab 02/17/14 0424 02/18/14 0630  AST 26 18  ALT 15 14  ALKPHOS 55 52  BILITOT 1.8* 2.4*  PROT 7.0 6.9  ALBUMIN 3.6 3.7   No results found for this basename: LIPASE, AMYLASE,  in the last 168 hours No results found for this basename: AMMONIA,  in the last 168 hours CBC:  Recent Labs Lab 02/16/14 0759 02/17/14 0424 02/18/14 0630 02/19/14 0350  WBC 12.2* 14.9* 18.1* 16.6*  NEUTROABS 7.9* 7.2 13.9* 11.3*  HGB 8.9* 8.1* 8.0* 8.0*  HCT 26.2* 23.5* 22.4* 23.2*  MCV 85.9 86.1 85.2 85.0  PLT 557* 489* 524* 573*   Cardiac Enzymes: No results found for this basename: CKTOTAL, CKMB, CKMBINDEX, TROPONINI,  in the last 168 hours BNP (last 3 results) No results found for this basename: PROBNP,  in the last 8760 hours CBG: No results found for this basename: GLUCAP,  in the last 168 hours  No results found for this or any previous visit (from the past 240 hour(s)).   Studies: Dg Chest 2 View  02/16/2014   CLINICAL DATA:  Sickle cell crisis.  Nonsmoker.  EXAM: CHEST  2 VIEW  COMPARISON:  01/29/2014  FINDINGS: The heart size and mediastinal contours are within normal limits. Both lungs are clear. Biconcave vertebral bodies compatible with known sickle cell disease. Right upper quadrant surgical clips.  IMPRESSION: No active cardiopulmonary disease.   Electronically Signed   By: Marin Olp M.D.   On: 02/16/2014 20:54   Dg Chest 2 View  01/29/2014   CLINICAL DATA:  Sickle cell crisis.  Chest pain.  EXAM: CHEST  2 VIEW  COMPARISON:  Chest radiograph performed 01/13/2014  FINDINGS: The lungs are well-aerated and clear. There is no evidence of focal opacification, pleural effusion or pneumothorax.  The heart is normal in size; the mediastinal contour is within normal limits. No acute osseous abnormalities are seen. H-shaped  vertebral bodies are compatible with the patient's known sickle cell disease. Clips are noted within the right upper quadrant, reflecting prior cholecystectomy.  IMPRESSION: No acute cardiopulmonary process seen.   Electronically Signed   By: Garald Balding M.D.   On: 01/29/2014 01:12   Dg Knee Complete 4 Views Left  01/29/2014   CLINICAL DATA:  Left knee pain. No injury. History of sickle cell disease.  EXAM: LEFT KNEE - COMPLETE 4+ VIEW  COMPARISON:  05/28/2013  FINDINGS: Subtle overall increased bone density compatible with history of sickle cell disease. No evidence of fracture, dislocation or joint effusion. Subtle stable sclerosis over the medial femoral condyle.  IMPRESSION: No acute findings.   Electronically Signed   By: Marin Olp M.D.   On: 01/29/2014 11:57    Scheduled Meds: . enoxaparin (LOVENOX) injection  40 mg Subcutaneous Q24H  . folic acid  1 mg Oral Daily  . HYDROmorphone PCA 2  mg/mL   Intravenous 6 times per day  . hydroxyurea  1,500 mg Oral Daily  . ketorolac  30 mg Intravenous 4 times per day  . methadone  5 mg Oral QHS  . polyethylene glycol  17 g Oral Daily  . senna-docusate  1 tablet Oral BID   Continuous Infusions: . sodium chloride 10 mL/hr at 02/18/14 1142    Total time spent 40 minutes.

## 2014-02-20 LAB — BASIC METABOLIC PANEL
Anion gap: 11 (ref 5–15)
BUN: 9 mg/dL (ref 6–23)
CALCIUM: 8.9 mg/dL (ref 8.4–10.5)
CO2: 25 mEq/L (ref 19–32)
Chloride: 101 mEq/L (ref 96–112)
Creatinine, Ser: 0.71 mg/dL (ref 0.50–1.10)
GFR calc Af Amer: >90 mL/min (ref >90)
GLUCOSE: 125 mg/dL — AB (ref 70–99)
Potassium: 4.1 mEq/L (ref 3.7–5.3)
SODIUM: 137 meq/L (ref 137–147)

## 2014-02-20 LAB — CBC WITH DIFFERENTIAL/PLATELET
BASOS ABS: 0 10*3/uL (ref 0.0–0.1)
Basophils Relative: 0 % (ref 0–1)
EOS ABS: 0.4 10*3/uL (ref 0.0–0.7)
EOS PCT: 2 % (ref 0–5)
HEMATOCRIT: 21.7 % — AB (ref 36.0–46.0)
Hemoglobin: 7.5 g/dL — ABNORMAL LOW (ref 12.0–15.0)
LYMPHS ABS: 1.8 10*3/uL (ref 0.7–4.0)
LYMPHS PCT: 11 % — AB (ref 12–46)
MCH: 30.4 pg (ref 26.0–34.0)
MCHC: 34.6 g/dL (ref 30.0–36.0)
MCV: 87.9 fL (ref 78.0–100.0)
MONO ABS: 1.2 10*3/uL — AB (ref 0.1–1.0)
Monocytes Relative: 7 % (ref 3–12)
Neutro Abs: 13 10*3/uL — ABNORMAL HIGH (ref 1.7–7.7)
Neutrophils Relative %: 80 % — ABNORMAL HIGH (ref 43–77)
PLATELETS: 558 10*3/uL — AB (ref 150–400)
RBC: 2.47 MIL/uL — ABNORMAL LOW (ref 3.87–5.11)
RDW: 20.6 % — AB (ref 11.5–15.5)
WBC: 16.5 10*3/uL — AB (ref 4.0–10.5)

## 2014-02-20 MED ORDER — VITAMINS A & D EX OINT
TOPICAL_OINTMENT | CUTANEOUS | Status: AC
  Administered 2014-02-20: 21:00:00
  Filled 2014-02-20: qty 5

## 2014-02-20 NOTE — Progress Notes (Signed)
Subjective: Patient is still complaining of 9/10 pain mainly around her right knee which she could not flex. It is so painful that it is light touch makes her jump. She is on her Dilaudid PCA and has used 33.58 mg with that before demands and 33 deliveries in the last 24 hours. She is also getting physician assisted dose and at 2 mg every 3 hours. She denied any fever or chills. She does continue to have pain mainly. No nausea no vomiting.  Objective: Vital signs in last 24 hours: Temp:  [98.9 F (37.2 C)-99.7 F (37.6 C)] 99.5 F (37.5 C) (10/08 0927) Pulse Rate:  [90-114] 90 (10/08 0927) Resp:  [9-18] 14 (10/08 0927) BP: (104-125)/(59-74) 111/59 mmHg (10/08 0927) SpO2:  [97 %-100 %] 100 % (10/08 0927) Weight change:  Last BM Date:  (pta)  Intake/Output from previous day: 10/07 0701 - 10/08 0700 In: 4878.4 [P.O.:480; I.V.:4398.4] Out: -  Intake/Output this shift:    General appearance: alert, cooperative and no distress Eyes: conjunctivae/corneas clear. PERRL, EOM's intact. Fundi benign. Ears: normal TM's and external ear canals both ears Neck: no adenopathy, no carotid bruit, no JVD, supple, symmetrical, trachea midline and thyroid not enlarged, symmetric, no tenderness/mass/nodules Back: symmetric, no curvature. ROM normal. No CVA tenderness. Resp: clear to auscultation bilaterally Chest wall: no tenderness Cardio: regular rate and rhythm, S1, S2 normal, no murmur, click, rub or gallop GI: soft, non-tender; bowel sounds normal; no masses,  no organomegaly Extremities: extremities normal, atraumatic, no cyanosis or edema Pulses: 2+ and symmetric Skin: Skin color, texture, turgor normal. No rashes or lesions Neurologic: Grossly normal  Lab Results:  Recent Labs  02/19/14 0350 02/20/14 0403  WBC 16.6* 16.5*  HGB 8.0* 7.5*  HCT 23.2* 21.7*  PLT 573* 558*   BMET  Recent Labs  02/19/14 0350 02/20/14 0403  NA 138 137  K 3.9 4.1  CL 98 101  CO2 30 25  GLUCOSE 94  125*  BUN 6 9  CREATININE 0.75 0.71  CALCIUM 9.1 8.9    Studies/Results: No results found.  Medications: I have reviewed the patient's current medications.  Assessment/Plan: A 23 yo with sickle cell disease admitted with sickle cell painful crisis.  #1 Sickle Cell Painful crisis: Patient is still in pain but is getting adequate coverage with Dilaudid PCA Toradol as well as assisted dose of 2 mg Dilaudid every 3 hours. If pain persists and is not getting better we might increase the frequency of the assisted dose to every 2 hours. The bolus dose is 0.7 and can be increased as well if needed.  #2 Sickle Cell Anemia: Continue to monitor H/H  #3 Leucocytosis: Due to VOC. Unchanged. Continue to monitor.  #4 Dehydration: Hydrated  #5 constipation: Continue MiraLax and Senokot S.    LOS: 4 days   GARBA,LAWAL 02/20/2014, 10:47 AM

## 2014-02-20 NOTE — Progress Notes (Signed)
PCA usage for the past 24 hours: 33.58 mg Demands: 34 Delivered: 33

## 2014-02-21 ENCOUNTER — Inpatient Hospital Stay (HOSPITAL_COMMUNITY): Payer: Medicare Other | Admitting: Anesthesiology

## 2014-02-21 ENCOUNTER — Encounter (HOSPITAL_COMMUNITY): Payer: Medicare Other | Admitting: Anesthesiology

## 2014-02-21 LAB — CBC WITH DIFFERENTIAL/PLATELET
BASOS PCT: 0 % (ref 0–1)
Basophils Absolute: 0 10*3/uL (ref 0.0–0.1)
EOS PCT: 3 % (ref 0–5)
Eosinophils Absolute: 0.5 10*3/uL (ref 0.0–0.7)
HEMATOCRIT: 20 % — AB (ref 36.0–46.0)
Hemoglobin: 6.9 g/dL — CL (ref 12.0–15.0)
LYMPHS PCT: 16 % (ref 12–46)
Lymphs Abs: 2.6 10*3/uL (ref 0.7–4.0)
MCH: 30 pg (ref 26.0–34.0)
MCHC: 34.5 g/dL (ref 30.0–36.0)
MCV: 87 fL (ref 78.0–100.0)
MONOS PCT: 10 % (ref 3–12)
Monocytes Absolute: 1.6 10*3/uL — ABNORMAL HIGH (ref 0.1–1.0)
NEUTROS ABS: 11.5 10*3/uL — AB (ref 1.7–7.7)
NEUTROS PCT: 71 % (ref 43–77)
Platelets: 519 10*3/uL — ABNORMAL HIGH (ref 150–400)
RBC: 2.3 MIL/uL — AB (ref 3.87–5.11)
RDW: 20.1 % — ABNORMAL HIGH (ref 11.5–15.5)
WBC: 16.2 10*3/uL — AB (ref 4.0–10.5)

## 2014-02-21 LAB — COMPREHENSIVE METABOLIC PANEL
ALBUMIN: 3.5 g/dL (ref 3.5–5.2)
ALT: 17 U/L (ref 0–35)
ANION GAP: 11 (ref 5–15)
AST: 34 U/L (ref 0–37)
Alkaline Phosphatase: 61 U/L (ref 39–117)
BILIRUBIN TOTAL: 2.5 mg/dL — AB (ref 0.3–1.2)
BUN: 11 mg/dL (ref 6–23)
CHLORIDE: 99 meq/L (ref 96–112)
CO2: 27 mEq/L (ref 19–32)
Calcium: 8.8 mg/dL (ref 8.4–10.5)
Creatinine, Ser: 0.73 mg/dL (ref 0.50–1.10)
GFR calc non Af Amer: >90 mL/min (ref >90)
GLUCOSE: 100 mg/dL — AB (ref 70–99)
Potassium: 5 mEq/L (ref 3.7–5.3)
SODIUM: 137 meq/L (ref 137–147)
TOTAL PROTEIN: 6.9 g/dL (ref 6.0–8.3)

## 2014-02-21 MED ORDER — HYDROMORPHONE 2 MG/ML HIGH CONCENTRATION IV PCA SOLN
INTRAVENOUS | Status: DC
  Administered 2014-02-21: 5.07 mg via INTRAVENOUS
  Administered 2014-02-21: 2.13 mg via INTRAVENOUS
  Administered 2014-02-21: 4.9 mg via INTRAVENOUS
  Administered 2014-02-22: 4.26 mg via INTRAVENOUS
  Administered 2014-02-22: 05:00:00 via INTRAVENOUS
  Administered 2014-02-22: 3.66 mg via INTRAVENOUS
  Administered 2014-02-22: 4.83 mg via INTRAVENOUS
  Filled 2014-02-21: qty 25

## 2014-02-21 NOTE — Plan of Care (Signed)
Problem: Phase I Progression Outcomes Goal: Bowel Movement At Least Every 3 Days Outcome: Not Met (add Reason) Patient last BM was prior to admission.

## 2014-02-21 NOTE — Progress Notes (Signed)
Paged NP Schorr on call about hemoglobin 6.9 this am.

## 2014-02-21 NOTE — Care Management Note (Signed)
CARE MANAGEMENT NOTE 02/21/2014  Patient:  LUCILLE, WITTS   Account Number:  [id number]  Date Initiated:  02/21/2014  Documentation initiated by:  Marney Doctor  Subjective/Objective Assessment:   Pt admitted with [facility]     Action/Plan:   From home with family   Anticipated DC Date:  02/23/2014   Anticipated DC Plan:  [place]  CM consult      Choice offered to / List presented to:             Status of service:  In process, will continue to follow Medicare Important Message given?  YES (If response is "NO", the following Medicare IM given date fields will be blank) Date Medicare IM given:  02/21/2014 Medicare IM given by:  Marney Doctor Date Additional Medicare IM given:   Additional Medicare IM given by:    Discharge Disposition:    Per UR Regulation:  Reviewed for med. necessity/level of care/duration of stay  If discussed at [place] of Stay Meetings, dates discussed:    Comments:  02/21/14 Marney Doctor RN,BSN,NCM Met with pt about DC needs.  None noted at this time.  CM will continue to follow.

## 2014-02-21 NOTE — Progress Notes (Signed)
Patient ID: Nancy [name], female   DOB: [birth year], 23 y.o.   MRN: [id number] SICKLE CELL SERVICE PROGRESS NOTE  Nancy [name] DUK:[id number] DOB: 22-Dec-1990 DOA: 02/16/2014 PCP: MATTHEWS,MICHELLE A., MD   Presenting HPI: Nancy [name] is a 23 y.o. female has a past medical history significant for sickle cell disease presents to the ED with bilateral arm and shoulder pain. She was just seen this morning, received IV hydration and pain medications, felt [name] and was discharged home. Throughout the day, however, despite taking her home doses of pain medication she felt like her pain is increasingly severe and decided to come back to the ER. She denies any fevers or chills, denies any abdominal pain, nausea or vomiting, and states that her pain is typical of her crises. She is afebrile in the ED, has mild leukocytosis which is her baseline. A CXR is pending however she has no respiratory symptoms and is not hypoxic. [facility] asked for admission for sickle cell crisis.  Consultants:  none  Procedures:  none  Antibiotics:  none  HPI/Subjective: Pt complains of an 9/10 pain in her right leg. It is difficult for her to move limb without causing pain. She is eating well. Denies fever, dysuria, and constipation.  Objective: Filed Vitals:   02/21/14 1004 02/21/14 1017 02/21/14 1225 02/21/14 1323  BP:  121/68  112/60  Pulse:  103  106  Temp:  99.5 F (37.5 C)  99.2 F (37.3 C)  TempSrc:  Oral  Oral  Resp: 12 16 16 16   Height:      Weight:      SpO2: 94% 100% 99% 99%   Weight change:   Intake/Output Summary (Last 24 hours) at 02/21/14 1605 Last data filed at 02/21/14 1323  Gross per 24 hour  Intake 1958.75 ml  Output      0 ml  Net 1958.75 ml    General: Alert, awake, oriented x3, in no acute distress.  HEENT: [place]/AT PEERL, EOMI Neck: Trachea midline,  no masses, no thyromegal,y no JVD, no carotid bruit OROPHARYNX:  Moist, No exudate/ erythema/lesions.  Heart: Regular  rate and rhythm, without murmurs, rubs, gallops. Lungs: Clear to auscultation Abdomen: Soft, nontender, nondistended, positive bowel sounds, no masses no hepatosplenomegaly noted..  Neuro: No focal neurological deficits noted cranial nerves II through XII grossly intact.  Musculoskeletal: No warm swelling or erythema around joints, no spinal tenderness noted. Tenderness through out right leg. Lymph node survey: No cervical axillary or inguinal lymphadenopathy noted.   Data Reviewed: Basic Metabolic Panel:  Recent Labs Lab 02/17/14 0424 02/18/14 0630 02/19/14 0350 02/20/14 0403 02/21/14 0425  NA 138 135* 138 137 137  K 4.0 4.1 3.9 4.1 5.0  CL 102 101 98 101 99  CO2 23 23 30 25 27   GLUCOSE 105* 95 94 125* 100*  BUN 8 5* 6 9 11   CREATININE 0.67 0.62 0.75 0.71 0.73  CALCIUM 8.6 8.8 9.1 8.9 8.8   Liver Function Tests:  Recent Labs Lab 02/17/14 0424 02/18/14 0630 02/21/14 0425  AST 26 18 34  ALT 15 14 17   ALKPHOS 55 52 61  BILITOT 1.8* 2.4* 2.5*  PROT 7.0 6.9 6.9  ALBUMIN 3.6 3.7 3.5   CBC:  Recent Labs Lab 02/17/14 0424 02/18/14 0630 02/19/14 0350 02/20/14 0403 02/21/14 0425  WBC 14.9* 18.1* 16.6* 16.5* 16.2*  NEUTROABS 7.2 13.9* 11.3* 13.0* 11.5*  HGB 8.1* 8.0* 8.0* 7.5* 6.9*  HCT 23.5* 22.4* 23.2* 21.7* 20.0*  MCV 86.1 85.2  85.0 87.9 87.0  PLT 489* 524* 573* 558* 519*    Studies: Dg Chest 2 View  01/13/2014   CLINICAL DATA:  Chest pain and sickle cell crisis  EXAM: CHEST  2 VIEW  COMPARISON:  PA and lateral chest of November 25, 2013  FINDINGS: The lungs are adequately inflated. There is no focal infiltrate. The heart and pulmonary vascularity exhibit no acute changes. There is stable central pulmonary vascular prominence. There is no pleural effusion. There are chronic vertebral body and humeral head changes consistent with sickle cell disease.  IMPRESSION: There is no evidence of pulmonary edema or alveolar pneumonia. There are mild stable changes consistent  with the sequelae of sickle cell disease.   Electronically Signed   By: David  Martinique   On: 01/13/2014 13:27    Scheduled Meds: . enoxaparin (LOVENOX) injection  40 mg Subcutaneous Q24H  . folic acid  1 mg Oral Daily  . HYDROmorphone PCA 2 mg/mL   Intravenous 6 times per day  . hydroxyurea  1,500 mg Oral Daily  . methadone  5 mg Oral QHS  . polyethylene glycol  17 g Oral Daily  . senna-docusate  1 tablet Oral BID   Continuous Infusions: . sodium chloride 10 mL/hr at 02/20/14 2018    Active Problems:   Sickle cell disease   Leukocytosis   Sickle cell anemia with crisis   Sickle cell crisis   Assessment/Plan: Active Problems:   Sickle cell disease   Leukocytosis   Sickle cell anemia with crisis   Sickle cell crisis  1. Sickle Cell Crisis: Pt still complains of pain of 9/10 without adequate relief and limitations to her function. Continue Dilaudid IV push. Increase PCA from 0.7mg  to 0.8mg  q10 min. Continue Toradol (day 5/5). Continue Robaxin and Methadone for long acting control. Crisis window should be resolving soon. Start weaning IV pain meds tomorrow. 2. Sickle Cell Care: Continue Folic acid and hydroxyurea   3. FEN/GI : Regular Diet  IV fluids- D5/0.45% @ 10cc/hr Bowel regimen in place  Code Status: full DVT Prophylaxis: enoxaparin Family Communication: none Disposition Plan:   Kalman Shan  Pager [phone]. If 7PM-7AM, please contact night-coverage.  02/21/2014, 4:05 PM  LOS: 5 days   Kalman Shan

## 2014-02-22 LAB — HEMOGLOBIN AND HEMATOCRIT, BLOOD
HCT: 19.2 % — ABNORMAL LOW (ref 36.0–46.0)
Hemoglobin: 6.5 g/dL — CL (ref 12.0–15.0)

## 2014-02-22 MED ORDER — HYDROMORPHONE HCL 2 MG/ML IJ SOLN
2.0000 mg | Freq: Once | INTRAMUSCULAR | Status: AC
  Administered 2014-02-22: 2 mg via INTRAVENOUS

## 2014-02-22 MED ORDER — HYDROMORPHONE 2 MG/ML HIGH CONCENTRATION IV PCA SOLN
INTRAVENOUS | Status: DC
  Administered 2014-02-22: 5 mg via INTRAVENOUS
  Administered 2014-02-22: 3.49 mg via INTRAVENOUS
  Administered 2014-02-22: 3 mg via INTRAVENOUS
  Administered 2014-02-22: 2.5 mg via INTRAVENOUS
  Administered 2014-02-23: 5 mg via INTRAVENOUS
  Administered 2014-02-23: 0.5 mg via INTRAVENOUS
  Filled 2014-02-22: qty 25

## 2014-02-22 MED ORDER — HYDROMORPHONE 2 MG/ML HIGH CONCENTRATION IV PCA SOLN: INTRAVENOUS | Status: DC

## 2014-02-22 NOTE — Plan of Care (Signed)
Problem: Phase III Progression Outcomes Goal: Progress with ambulation Outcome: Progressing Up as tol in room

## 2014-02-22 NOTE — Progress Notes (Signed)
Patient refused lovenox. 

## 2014-02-22 NOTE — Plan of Care (Signed)
Problem: Phase III Progression Outcomes Goal: IV fluids wean to po Outcome: Completed/Met Date Met:  02/22/14 kvo IVFs

## 2014-02-22 NOTE — Progress Notes (Signed)
Patient ID: [name], female   DOB: 1991-03-24, 23 y.o.   MRN: [id number] SICKLE CELL SERVICE PROGRESS NOTE  [name] DJS:[id number] DOB: 1991/04/05 DOA: 02/16/2014 PCP: MATTHEWS,MICHELLE A., MD   Presenting HPI: [name] is a 23 y.o. female has a past medical history significant for sickle cell disease presents to the ED with bilateral arm and shoulder pain. She was just seen this morning, received IV hydration and pain medications, felt better and was discharged home. Throughout the day, however, despite taking her home doses of pain medication she felt like her pain is increasingly severe and decided to come back to the ER. She denies any fevers or chills, denies any abdominal pain, nausea or vomiting, and states that her pain is typical of her crises. She is afebrile in the ED, has mild leukocytosis which is her baseline. A CXR is pending however she has no respiratory symptoms and is not hypoxic. [facility] asked for admission for sickle cell crisis.  Consultants:  none  Procedures:  none  Antibiotics:  none  HPI/Subjective: Pt complains of an 8/10 pain today in her right leg. Pain is slightly better but still has difficulty moving limb without causing pain. She is eating well. Denies fever, dysuria, and constipation.  Objective: Filed Vitals:   02/22/14 0729 02/22/14 0800 02/22/14 0948 02/22/14 1205  BP:   111/73   Pulse:   91   Temp:   98.7 F (37.1 C)   TempSrc:   Oral   Resp: 14 14 15 14   Height:      Weight:      SpO2:  95% 99% 98%   Weight change: 12.8 oz (0.363 kg)  Intake/Output Summary (Last 24 hours) at 02/22/14 1311 Last data filed at 02/22/14 0900  Gross per 24 hour  Intake 2350.93 ml  Output    650 ml  Net 1700.93 ml    General: Alert, awake, oriented x3, in no acute distress.  HEENT: [place]/AT PEERL, EOMI Neck: Trachea midline,  no masses, no thyromegal,y no JVD, no carotid bruit OROPHARYNX:  Moist, No exudate/ erythema/lesions.   Heart: Regular rate and rhythm, without murmurs, rubs, gallops. Lungs: Clear to auscultation Abdomen: Soft, nontender, nondistended, positive bowel sounds, no masses no hepatosplenomegaly noted.  Neuro: No focal neurological deficits noted cranial nerves II through XII grossly intact.  Musculoskeletal: No warm swelling or erythema around joints, no spinal tenderness noted. Tenderness through out right leg. Lymph node survey: No cervical axillary or inguinal lymphadenopathy noted.  -unchanged  Data Reviewed: Basic Metabolic Panel:  Recent Labs Lab 02/17/14 0424 02/18/14 0630 02/19/14 0350 02/20/14 0403 02/21/14 0425  NA 138 135* 138 137 137  K 4.0 4.1 3.9 4.1 5.0  CL 102 101 98 101 99  CO2 23 23 30 25 27   GLUCOSE 105* 95 94 125* 100*  BUN 8 5* 6 9 11   CREATININE 0.67 0.62 0.75 0.71 0.73  CALCIUM 8.6 8.8 9.1 8.9 8.8   Liver Function Tests:  Recent Labs Lab 02/17/14 0424 02/18/14 0630 02/21/14 0425  AST 26 18 34  ALT 15 14 17   ALKPHOS 55 52 61  BILITOT 1.8* 2.4* 2.5*  PROT 7.0 6.9 6.9  ALBUMIN 3.6 3.7 3.5   CBC:  Recent Labs Lab 02/17/14 0424 02/18/14 0630 02/19/14 0350 02/20/14 0403 02/21/14 0425 02/22/14 0550  WBC 14.9* 18.1* 16.6* 16.5* 16.2*  --   NEUTROABS 7.2 13.9* 11.3* 13.0* 11.5*  --   HGB 8.1* 8.0* 8.0* 7.5* 6.9* 6.5*  HCT 23.5* 22.4* 23.2* 21.7* 20.0* 19.2*  MCV 86.1 85.2 85.0 87.9 87.0  --   PLT 489* 524* 573* 558* 519*  --     Studies: Dg Chest 2 View  01/13/2014   CLINICAL DATA:  Chest pain and sickle cell crisis  EXAM: CHEST  2 VIEW  COMPARISON:  PA and lateral chest of November 25, 2013  FINDINGS: The lungs are adequately inflated. There is no focal infiltrate. The heart and pulmonary vascularity exhibit no acute changes. There is stable central pulmonary vascular prominence. There is no pleural effusion. There are chronic vertebral body and humeral head changes consistent with sickle cell disease.  IMPRESSION: There is no evidence of pulmonary  edema or alveolar pneumonia. There are mild stable changes consistent with the sequelae of sickle cell disease.   Electronically Signed   By: David  Martinique   On: 01/13/2014 13:27    Scheduled Meds: . enoxaparin (LOVENOX) injection  40 mg Subcutaneous Q24H  . folic acid  1 mg Oral Daily  . HYDROmorphone PCA 2 mg/mL   Intravenous 6 times per day  . hydroxyurea  1,500 mg Oral Daily  . methadone  5 mg Oral QHS  . polyethylene glycol  17 g Oral Daily  . senna-docusate  1 tablet Oral BID   Continuous Infusions: . sodium chloride 500 mL (02/22/14 0730)    Active Problems:   Sickle cell disease   Leukocytosis   Sickle cell anemia with crisis   Sickle cell crisis   Assessment/Plan: Active Problems:   Sickle cell disease   Leukocytosis   Sickle cell anemia with crisis   Sickle cell crisis  1. Sickle Cell Crisis: Pt still complains of pain but reports a slight improvement at an 8/10. Her window for VOC pain should be resolving and pain has improved. 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 Will decrease PCA from 0.8mg  to 0.5mg  q10 min. Discontinue Dilaudid IV push. Toradol course completed. Continue Robaxin and Methadone for long acting control.  2. Sickle Cell Care: Continue Folic acid and hydroxyurea   3. FEN/GI : Regular Diet  IV fluids- D5/0.45%KVO Bowel regimen in place  Code Status: full DVT Prophylaxis: enoxaparin Family Communication: none Disposition Plan:   Kalman Shan  Pager [phone]. If 7PM-7AM, please contact night-coverage.  02/22/2014, 1:11 PM  LOS: 6 days   Kortni Hasten, Juliene Pina

## 2014-02-22 NOTE — Plan of Care (Signed)
Problem: Phase I Progression Outcomes Goal: Pain controlled with appropriate interventions Outcome: Progressing PCA bolus dose increased 10/9

## 2014-02-22 NOTE — Plan of Care (Signed)
Problem: Phase I Progression Outcomes Goal: Bowel Movement At Least Every 3 Days Outcome: Progressing Patient without a bowel movement since admission on 10/4

## 2014-02-22 NOTE — Plan of Care (Signed)
Problem: Phase I Progression Outcomes Goal: Bowel Movement At Least Every 3 Days Outcome: Progressing Fluids encouraged,took Miralax and senokot as scheduled.

## 2014-02-23 LAB — BASIC METABOLIC PANEL
Anion gap: 12 (ref 5–15)
BUN: 6 mg/dL (ref 6–23)
CO2: 25 meq/L (ref 19–32)
Calcium: 9.2 mg/dL (ref 8.4–10.5)
Chloride: 100 mEq/L (ref 96–112)
Creatinine, Ser: 0.55 mg/dL (ref 0.50–1.10)
GFR calc Af Amer: >90 mL/min (ref >90)
Glucose, Bld: 110 mg/dL — ABNORMAL HIGH (ref 70–99)
POTASSIUM: 4.2 meq/L (ref 3.7–5.3)
SODIUM: 137 meq/L (ref 137–147)

## 2014-02-23 LAB — CBC WITH DIFFERENTIAL/PLATELET
Basophils Absolute: 0.1 10*3/uL (ref 0.0–0.1)
Basophils Relative: 1 % (ref 0–1)
Eosinophils Absolute: 0.4 10*3/uL (ref 0.0–0.7)
Eosinophils Relative: 4 % (ref 0–5)
HCT: 21.1 % — ABNORMAL LOW (ref 36.0–46.0)
HEMOGLOBIN: 7.1 g/dL — AB (ref 12.0–15.0)
LYMPHS ABS: 2.4 10*3/uL (ref 0.7–4.0)
LYMPHS PCT: 22 % (ref 12–46)
MCH: 29.5 pg (ref 26.0–34.0)
MCHC: 33.6 g/dL (ref 30.0–36.0)
MCV: 87.6 fL (ref 78.0–100.0)
MONOS PCT: 7 % (ref 3–12)
Monocytes Absolute: 0.8 10*3/uL (ref 0.1–1.0)
NEUTROS ABS: 7.3 10*3/uL (ref 1.7–7.7)
NEUTROS PCT: 66 % (ref 43–77)
PLATELETS: 369 10*3/uL (ref 150–400)
RBC: 2.41 MIL/uL — ABNORMAL LOW (ref 3.87–5.11)
RDW: 20.4 % — ABNORMAL HIGH (ref 11.5–15.5)
WBC: 11 10*3/uL — AB (ref 4.0–10.5)

## 2014-02-23 LAB — RETICULOCYTES: RBC.: 2.41 MIL/uL — AB (ref 3.87–5.11)

## 2014-02-23 MED ORDER — SODIUM CHLORIDE 0.9 % IJ SOLN
9.0000 mL | INTRAMUSCULAR | Status: DC | PRN
Start: 2014-02-23 — End: 2014-02-25

## 2014-02-23 MED ORDER — NALOXONE HCL 0.4 MG/ML IJ SOLN: 0.4000 mg | INTRAMUSCULAR | Status: DC | PRN

## 2014-02-23 MED ORDER — ONDANSETRON HCL 4 MG/2ML IJ SOLN: 4.0000 mg | Freq: Four times a day (QID) | INTRAMUSCULAR | Status: DC | PRN

## 2014-02-23 MED ORDER — HYDROMORPHONE 2 MG/ML HIGH CONCENTRATION IV PCA SOLN
INTRAVENOUS | Status: DC
  Administered 2014-02-23: 2.8 mg via INTRAVENOUS
  Administered 2014-02-23: 10.8 mg via INTRAVENOUS
  Administered 2014-02-23: 4 mg via INTRAVENOUS
  Administered 2014-02-24: 3.2 mg via INTRAVENOUS
  Administered 2014-02-24: 2.4 mg via INTRAVENOUS
  Administered 2014-02-24: 1.2 mg via INTRAVENOUS
  Administered 2014-02-24: 6 mg via INTRAVENOUS
  Administered 2014-02-24: 4.6 mg via INTRAVENOUS
  Administered 2014-02-24: 11:00:00 via INTRAVENOUS
  Administered 2014-02-25: 3.2 mg via INTRAVENOUS
  Administered 2014-02-25: 1.6 mg via INTRAVENOUS
  Administered 2014-02-25: 2 mg via INTRAVENOUS
  Filled 2014-02-23: qty 25

## 2014-02-23 MED ORDER — ASPIRIN-ACETAMINOPHEN-CAFFEINE 250-250-65 MG PO TABS
2.0000 | ORAL_TABLET | Freq: Four times a day (QID) | ORAL | Status: DC | PRN
  Administered 2014-02-23: 2 via ORAL
  Filled 2014-02-23: qty 2

## 2014-02-23 MED ORDER — MINERAL OIL PO OIL
960.0000 mL | TOPICAL_OIL | Freq: Once | ORAL | Status: AC
Start: 2014-02-23 — End: 2014-02-23
  Administered 2014-02-23: 960 mL via RECTAL
  Filled 2014-02-23: qty 240

## 2014-02-23 MED ORDER — OXYCODONE HCL 5 MG PO TABS
15.0000 mg | ORAL_TABLET | ORAL | Status: DC
  Administered 2014-02-23 – 2014-02-25 (×10): 15 mg via ORAL
  Filled 2014-02-23 (×14): qty 3

## 2014-02-23 NOTE — Progress Notes (Signed)
Dr. Alben Deeds ordered to d/c High concentration Dilaudid PCA.I went to explained this to patient and told me that she has increased pain in her R leg today,and requested no to d/c PCA. I called MD and ordered to continue PCA. Will continue to monitor the patient.- Sandie Ano RN

## 2014-02-23 NOTE — Progress Notes (Signed)
Patient ID: [name], female   DOB: [birth year], 23 y.o.   MRN: [id number] SICKLE CELL SERVICE PROGRESS NOTE  PARISSA CHIAO EHU:[id number] DOB: 11/29/1990 DOA: 02/16/2014 PCP: MATTHEWS,MICHELLE A., MD   Presenting HPI: [name] is a 23 y.o. female has a past medical history significant for sickle cell disease presents to the ED with bilateral arm and shoulder pain. She was just seen this morning, received IV hydration and pain medications, felt better and was discharged home. Throughout the day, however, despite taking her home doses of pain medication she felt like her pain is increasingly severe and decided to come back to the ER. She denies any fevers or chills, denies any abdominal pain, nausea or vomiting, and states that her pain is typical of her crises. She is afebrile in the ED, has mild leukocytosis which is her baseline. A CXR is pending however she has no respiratory symptoms and is not hypoxic. [facility] asked for admission for sickle cell crisis.  Consultants:  none  Procedures:  none  Antibiotics:  none  HPI/Subjective: Pt complains of a 7/10 pain today in her right leg but now states she is having increased pain in her back since last night. She still has difficulty moving limb without causing pain. Attempted to see how pt would perform this morning without PCA on board, but she regressed and refused to move leg. She is eating well. She tried to have a bowel movement yesterday but when she would squeeze down, it would cause her to have more leg pain so she doesn't attempt anymore. She is passing flatulence.  Objective: Filed Vitals:   02/23/14 0800 02/23/14 1030 02/23/14 1057 02/23/14 1200  BP:  99/62    Pulse:  98    Temp:  98.3 F (36.8 C)    TempSrc:  Oral    Resp: 18 14 17 16   Height:      Weight:      SpO2: 99% 97% 96% 98%   Weight change:   Intake/Output Summary (Last 24 hours) at 02/23/14 1352 Last data filed at 02/23/14 0945  Gross per 24  hour  Intake 1400.87 ml  Output    850 ml  Net 550.87 ml    General: Alert, awake, oriented x3, in no acute distress.  HEENT: [place]/AT PEERL, EOMI Neck: Trachea midline,  no masses, no thyromegal,y no JVD, no carotid bruit OROPHARYNX:  Moist, No exudate/ erythema/lesions.  Heart: Regular rate and rhythm, without murmurs, rubs, gallops. Lungs: Clear to auscultation Abdomen: Soft, nontender, nondistended, positive bowel sounds, no masses no hepatosplenomegaly noted.  Neuro: No focal neurological deficits noted cranial nerves II through XII grossly intact. Able to wiggle toes but refuses to move right leg. Musculoskeletal: No warm swelling or erythema around joints, no spinal tenderness noted. Tenderness through out right leg. Lymph node survey: No cervical axillary or inguinal lymphadenopathy noted.  Data Reviewed: Basic Metabolic Panel:  Recent Labs Lab 02/18/14 0630 02/19/14 0350 02/20/14 0403 02/21/14 0425 02/23/14 0729  NA 135* 138 137 137 137  K 4.1 3.9 4.1 5.0 4.2  CL 101 98 101 99 100  CO2 23 30 25 27 25   GLUCOSE 95 94 125* 100* 110*  BUN 5* 6 9 11 6   CREATININE 0.62 0.75 0.71 0.73 0.55  CALCIUM 8.8 9.1 8.9 8.8 9.2   Liver Function Tests:  Recent Labs Lab 02/17/14 0424 02/18/14 0630 02/21/14 0425  AST 26 18 34  ALT 15 14 17   ALKPHOS 55 52 61  BILITOT 1.8* 2.4* 2.5*  PROT 7.0 6.9 6.9  ALBUMIN 3.6 3.7 3.5   CBC:  Recent Labs Lab 02/18/14 0630 02/19/14 0350 02/20/14 0403 02/21/14 0425 02/22/14 0550 02/23/14 0729  WBC 18.1* 16.6* 16.5* 16.2*  --  11.0*  NEUTROABS 13.9* 11.3* 13.0* 11.5*  --  7.3  HGB 8.0* 8.0* 7.5* 6.9* 6.5* 7.1*  HCT 22.4* 23.2* 21.7* 20.0* 19.2* 21.1*  MCV 85.2 85.0 87.9 87.0  --  87.6  PLT 524* 573* 558* 519*  --  369    Studies: Dg Chest 2 View  01/13/2014   CLINICAL DATA:  Chest pain and sickle cell crisis  EXAM: CHEST  2 VIEW  COMPARISON:  PA and lateral chest of November 25, 2013  FINDINGS: The lungs are adequately inflated.  There is no focal infiltrate. The heart and pulmonary vascularity exhibit no acute changes. There is stable central pulmonary vascular prominence. There is no pleural effusion. There are chronic vertebral body and humeral head changes consistent with sickle cell disease.  IMPRESSION: There is no evidence of pulmonary edema or alveolar pneumonia. There are mild stable changes consistent with the sequelae of sickle cell disease.   Electronically Signed   By: David  Martinique   On: 01/13/2014 13:27    Scheduled Meds: . enoxaparin (LOVENOX) injection  40 mg Subcutaneous Q24H  . folic acid  1 mg Oral Daily  . HYDROmorphone PCA 2 mg/mL   Intravenous 6 times per day  . hydroxyurea  1,500 mg Oral Daily  . methadone  5 mg Oral QHS  . oxyCODONE  15 mg Oral 6 times per day  . polyethylene glycol  17 g Oral Daily  . senna-docusate  1 tablet Oral BID  . sorbitol, milk of mag, mineral oil, glycerin (SMOG) enema  960 mL Rectal Once   Continuous Infusions: . sodium chloride 500 mL (02/22/14 0730)    Active Problems:   Sickle cell disease   Leukocytosis   Sickle cell anemia with crisis   Sickle cell crisis   Assessment/Plan: Active Problems:   Sickle cell disease   Leukocytosis   Sickle cell anemia with crisis   Sickle cell crisis  1. Sickle Cell Crisis: Pt still complains of pain but reports a slight improvement at a 7/10, but now complains of back pain. Attempted to see how she would walk with PCA off for 1 hour this morning, but pain was intolerable. Restarted Dilaudid PCA but at a lower dose at 0.4mg  q10 min. Will consider increasing PCA if back pain worsens. Started home dose oxycodone 10mg  q4h. Continue Robaxin and Methadone for long acting control.  2. Right leg mobility: Pt refuses to move right leg due to pain being intolerable. Placed PT/OT consult to assess function. She may need an assistive device in order to ambulate at home until pain improves. 3. Sickle Cell Care: Continue Folic acid  and hydroxyurea 4. Constipation: Pt feels urge to have bowel movement, but when she attempts it worsens her leg pain. She is drinking Miralax with no subsequent BM. Will give SMOG enema X1 to soften stool.    5. FEN/GI : Regular Diet  IV fluids- D5/0.45%KVO Bowel regimen in place  Code Status: full DVT Prophylaxis: enoxaparin Family Communication: none Disposition Plan:   Kalman Shan  Pager [phone]. If 7PM-7AM, please contact night-coverage.  02/23/2014, 1:52 PM  LOS: 7 days   Jelitza Manninen, Juliene Pina

## 2014-02-23 NOTE — Progress Notes (Signed)
Patient refused SMOG enema at this time,requested to have it tonight ,time changed for 2000. I will endorsed to night shift nurse.Sandie Ano RN

## 2014-02-23 NOTE — Progress Notes (Signed)
Patient's High concentration PCA modified per order,Pt.bolus dose 0.4, 1 hr. Dose- 2mg  and lockout interval 10 min verified with Clotilde Dieter RN.- Sandie Ano RN

## 2014-02-24 LAB — HEMOGLOBIN AND HEMATOCRIT, BLOOD
HEMATOCRIT: 19.7 % — AB (ref 36.0–46.0)
Hemoglobin: 6.8 g/dL — CL (ref 12.0–15.0)

## 2014-02-24 LAB — LACTATE DEHYDROGENASE: LDH: 393 U/L — ABNORMAL HIGH (ref 94–250)

## 2014-02-24 MED ORDER — HYDROMORPHONE HCL 1 MG/ML IJ SOLN
1.0000 mg | Freq: Once | INTRAMUSCULAR | Status: AC
  Administered 2014-02-24: 1 mg via INTRAVENOUS

## 2014-02-24 MED ORDER — SULINDAC 150 MG PO TABS
150.0000 mg | ORAL_TABLET | Freq: Two times a day (BID) | ORAL | Status: DC
  Administered 2014-02-24 – 2014-02-25 (×2): 150 mg via ORAL
  Filled 2014-02-24 (×3): qty 1

## 2014-02-24 NOTE — Progress Notes (Signed)
CRITICAL VALUE ALERT  Critical value received: Hgb 6.8  Date of notification:  02/24/14  Time of notification:  7579  Critical value read back:Yes.    Nurse who received alert:  Harlow Asa RN  MD notified (1st page):  Dr. Shanda Howells  Time of first page:  [phone]  Responding MD:  [phone]  Time MD responded:  Dr. Shanda Howells

## 2014-02-24 NOTE — Evaluation (Addendum)
Physical Therapy Evaluation Patient Details Name: [name] MRN: [id number] DOB: 1991/03/03 Today's Date: 02/24/2014   History of Present Illness  [name] is a 23 y.o. female with history of sickle cell disease presents with complaints of bilateral knee pain and chest pain which patient states is typical of her sickle cell pain crisis. admitted 02/20/14  Clinical Impression  Patient requires exceptional amount of time to complete task. C/o R groinn and knee pain. Patient will benefit from PT to address problems listed in note.    Follow Up Recommendations No PT follow up    Equipment Recommendations  Patient requesting a 4 wheeled RW.   Recommendations for Other Services       Precautions / Restrictions Precautions Precautions: Fall      Mobility  Bed Mobility Overal bed mobility: Independent                Transfers Overall transfer level: Needs assistance Equipment used: Rolling walker (2 wheeled) Transfers: Sit to/from Stand Sit to Stand: Supervision         General transfer comment: IV pole used  first to walk to BR  Ambulation/Gait Ambulation/Gait assistance: Supervision Ambulation Distance (Feet): 25 Feet Assistive device: Rolling walker (2 wheeled) Gait Pattern/deviations: Step-through pattern;Shuffle;Trunk flexed;Decreased stride length     General Gait Details: PUSHED wc X 80' X 2 AS rw DID NOT ROLL WELL. , leaning forward over [facility]  Stairs            Wheelchair Mobility    Modified Rankin (Stroke Patients Only)       Balance Overall balance assessment: Needs assistance Sitting-balance support: Feet supported;No upper extremity supported Sitting balance-Leahy Scale: Normal     Standing balance support: No upper extremity supported;During functional activity Standing balance-Leahy Scale: Good                               Pertinent Vitals/Pain Pain Assessment: 0-10 Pain Score: 9  Pain Location:  groin Pain Descriptors / Indicators: Aching Pain Intervention(s): Monitored during session;PCA encouraged    Home Living Family/patient expects to be discharged to:: Private residence Living Arrangements: Other relatives Available Help at Discharge: Family;Available PRN/intermittently         Home Layout: One level Home Equipment: Walker - 2 wheels      Prior Function Level of Independence: Independent with assistive device(s)               Hand Dominance        Extremity/Trunk Assessment   Upper Extremity Assessment: Overall WFL for tasks assessed           Lower Extremity Assessment: Overall WFL for tasks assessed         Communication   Communication: No difficulties  Cognition Arousal/Alertness: Awake/alert Behavior During Therapy: Anxious Overall Cognitive Status: Within Functional Limits for tasks assessed                      General Comments      Exercises        Assessment/Plan    PT Assessment Patient needs continued PT services  PT Diagnosis Difficulty walking;Acute pain   PT Problem List Decreased activity tolerance;Decreased mobility;Pain  PT Treatment Interventions Gait training;Functional mobility training;Therapeutic activities;Therapeutic exercise;Patient/family education   PT Goals (Current goals can be found in the Care Plan section) Acute Rehab PT Goals Patient Stated Goal: to walk PT Goal Formulation:  With patient Time For Goal Achievement: 03/10/14 Potential to Achieve Goals: Good    Frequency Min 3X/week   Barriers to discharge        Co-evaluation               End of Session   Activity Tolerance: Patient limited by pain Patient left: in bed;with call bell/phone within reach Nurse Communication: Mobility status         Time: 3474-2595 PT Time Calculation (min): 66 min   Charges:   PT Evaluation $Initial PT Evaluation Tier I: 1 Procedure PT Treatments $Gait Training: 23-37 mins $Self  Care/Home Management: 23-37   PT G Codes:          [name] 02/24/2014, 5:08 PM [name] PT [phone]

## 2014-02-24 NOTE — Progress Notes (Signed)
SICKLE CELL SERVICE PROGRESS NOTE  [name] HYQ:[id number] DOB: 1991/04/26 DOA: 02/16/2014 PCP: MATTHEWS,MICHELLE A., MD  Assessment/Plan: Active Problems:   Sickle cell disease   Leukocytosis   Sickle cell anemia with crisis   Sickle cell crisis  1. Hb SS with crisis: Pt having pain mostly localized to right thigh which is likely an infarct to the bone. I have had a long discussion with patient about the expectations with bone infarcts. I will start on sulindac for a short course. Pt will have to be off work as her job entails standing. I will recheck renal function tomorrow. If normal will plan to discharge home on Sulindac. Will continue scheduled oral analgesics and continue PCA as PRN.  2. Gait Abnormality: PT to see patient today. Will await their recommendations. 3. Constipation:  Continue Miralax to Senna-S.  4. Sickle Cell Disease: Pt has not been compliant with Hydrea. She admits to non-compliance without any reason. I will address further in clinic and patient will be required to keep a daily Journal re: Sickle Cell Disease and management of self and disease.   Code Status: full code Family Communication: Mother at bedside and updated Disposition Plan: Not yet ready for discharge  [place].  Pager [phone]. If 7PM-7AM, please contact night-coverage.  02/24/2014, 5:20 PM  LOS: 8 days   Initial H&P: [name] is a 23 y.o. female has a past medical history significant for sickle cell disease presents to the ED with bilateral arm and shoulder pain. She was just seen this morning, received IV hydration and pain medications, felt better and was discharged home. Throughout the day, however, despite taking her home doses of pain medication she felt like her pain is increasingly severe and decided to come back to the ER. She denies any fevers or chills, denies any abdominal pain, nausea or vomiting, and states that her pain is typical of her crises. She is  afebrile in the ED, has mild leukocytosis which is her baseline. A CXR is pending however she has no respiratory symptoms and is not hypoxic. TRH asked for admission for sickle cell crisis.   Consultants:  None  Procedures:  None  Antibiotics:  None  HPI/Subjective:  Pt having pain mostly localized to right thigh.  Objective: Filed Vitals:   02/24/14 1012 02/24/14 1218 02/24/14 1516 02/24/14 1600  BP: 116/70  112/81   Pulse: 87  102   Temp: 98.2 F (36.8 C)  98.7 F (37.1 C)   TempSrc: Oral  Oral   Resp: 16 16 17 16   Height:      Weight:      SpO2: 100% 100% 100% 100%   Weight change:   Intake/Output Summary (Last 24 hours) at 02/24/14 1720 Last data filed at 02/24/14 1516  Gross per 24 hour  Intake    560 ml  Output    401 ml  Net    159 ml    General: Alert, awake, oriented x3, in no acute distress.  HEENT: [place]/AT PEERL, EOMI, anicteric Neck: Trachea midline,  no masses, no thyromegal,y no JVD, no carotid bruit OROPHARYNX:  Moist, No exudate/ erythema/lesions.  Heart: Regular rate and rhythm, without murmurs, rubs, gallops, PMI non-displaced, no heaves or thrills on palpation.  Lungs: Clear to auscultation, no wheezing or rhonchi noted. No increased vocal fremitus resonant to percussion  Abdomen: Soft, nontender, nondistended, positive bowel sounds, no masses no hepatosplenomegaly noted..  Neuro: No focal neurological deficits noted cranial nerves II through XII  grossly intact.  Strength functional in bilateral upper and lower extremities. Musculoskeletal: Tenderness noted at right posterior thigh and knee. Psychiatric: Patient alert and oriented x3, good insight and cognition, good recent to remote recall. Lymph node survey: No cervical axillary or inguinal lymphadenopathy noted.   Data Reviewed: Basic Metabolic Panel:  Recent Labs Lab 02/18/14 0630 02/19/14 0350 02/20/14 0403 02/21/14 0425 02/23/14 0729  NA 135* 138 137 137 137  K 4.1 3.9 4.1 5.0  4.2  CL 101 98 101 99 100  CO2 23 30 25 27 25   GLUCOSE 95 94 125* 100* 110*  BUN 5* 6 9 11 6   CREATININE 0.62 0.75 0.71 0.73 0.55  CALCIUM 8.8 9.1 8.9 8.8 9.2   Liver Function Tests:  Recent Labs Lab 02/18/14 0630 02/21/14 0425  AST 18 34  ALT 14 17  ALKPHOS 52 61  BILITOT 2.4* 2.5*  PROT 6.9 6.9  ALBUMIN 3.7 3.5   No results found for this basename: LIPASE, AMYLASE,  in the last 168 hours No results found for this basename: AMMONIA,  in the last 168 hours CBC:  Recent Labs Lab 02/18/14 0630 02/19/14 0350 02/20/14 0403 02/21/14 0425 02/22/14 0550 02/23/14 0729 02/24/14 0510  WBC 18.1* 16.6* 16.5* 16.2*  --  11.0*  --   NEUTROABS 13.9* 11.3* 13.0* 11.5*  --  7.3  --   HGB 8.0* 8.0* 7.5* 6.9* 6.5* 7.1* 6.8*  HCT 22.4* 23.2* 21.7* 20.0* 19.2* 21.1* 19.7*  MCV 85.2 85.0 87.9 87.0  --  87.6  --   PLT 524* 573* 558* 519*  --  369  --    Cardiac Enzymes: No results found for this basename: CKTOTAL, CKMB, CKMBINDEX, TROPONINI,  in the last 168 hours BNP (last 3 results) No results found for this basename: PROBNP,  in the last 8760 hours CBG: No results found for this basename: GLUCAP,  in the last 168 hours  No results found for this or any previous visit (from the past 240 hour(s)).   Studies: Dg Chest 2 View  02/16/2014   CLINICAL DATA:  Sickle cell crisis.  Nonsmoker.  EXAM: CHEST  2 VIEW  COMPARISON:  01/29/2014  FINDINGS: The heart size and mediastinal contours are within normal limits. Both lungs are clear. Biconcave vertebral bodies compatible with known sickle cell disease. Right upper quadrant surgical clips.  IMPRESSION: No active cardiopulmonary disease.   Electronically Signed   By: Marin Olp M.D.   On: 02/16/2014 20:54   Dg Chest 2 View  01/29/2014   CLINICAL DATA:  Sickle cell crisis.  Chest pain.  EXAM: CHEST  2 VIEW  COMPARISON:  Chest radiograph performed 01/13/2014  FINDINGS: The lungs are well-aerated and clear. There is no evidence of focal  opacification, pleural effusion or pneumothorax.  The heart is normal in size; the mediastinal contour is within normal limits. No acute osseous abnormalities are seen. H-shaped vertebral bodies are compatible with the patient's known sickle cell disease. Clips are noted within the right upper quadrant, reflecting prior cholecystectomy.  IMPRESSION: No acute cardiopulmonary process seen.   Electronically Signed   By: Garald Balding M.D.   On: 01/29/2014 01:12   Dg Knee Complete 4 Views Left  01/29/2014   CLINICAL DATA:  Left knee pain. No injury. History of sickle cell disease.  EXAM: LEFT KNEE - COMPLETE 4+ VIEW  COMPARISON:  05/28/2013  FINDINGS: Subtle overall increased bone density compatible with history of sickle cell disease. No evidence of fracture, dislocation or  joint effusion. Subtle stable sclerosis over the medial femoral condyle.  IMPRESSION: No acute findings.   Electronically Signed   By: Marin Olp M.D.   On: 01/29/2014 11:57    Scheduled Meds: . enoxaparin (LOVENOX) injection  40 mg Subcutaneous Q24H  . folic acid  1 mg Oral Daily  . HYDROmorphone PCA 2 mg/mL   Intravenous 6 times per day  . hydroxyurea  1,500 mg Oral Daily  . methadone  5 mg Oral QHS  . oxyCODONE  15 mg Oral 6 times per day  . polyethylene glycol  17 g Oral Daily  . senna-docusate  1 tablet Oral BID   Continuous Infusions: . sodium chloride 500 mL (02/23/14 1857)    Total time spent 35 minutes.

## 2014-02-24 NOTE — Progress Notes (Signed)
Smog enema administered to pt. Pt reports that she was able to have a BM. Noreene Larsson RN, BSN

## 2014-02-24 NOTE — Progress Notes (Signed)
PCA 24 hour usage: 13.1 mg Demands:35 Deliveries:32

## 2014-02-24 NOTE — Progress Notes (Signed)
PT Cancellation Note  Patient Details Name: LEATHA ROHNER MRN: [id number] DOB: [birth year]   Cancelled Treatment:    Reason Eval/Treat Not Completed: Patient declined,  (waiting on lunch) PT will  return this PM)   Claretha Cooper 02/24/2014, 11:41 AM Tresa Endo PT [phone]

## 2014-02-25 DIAGNOSIS — M79604 Pain in right leg: Secondary | ICD-10-CM

## 2014-02-25 DIAGNOSIS — M90551 Osteonecrosis in diseases classified elsewhere, right thigh: Secondary | ICD-10-CM

## 2014-02-25 LAB — BASIC METABOLIC PANEL
ANION GAP: 14 (ref 5–15)
BUN: 9 mg/dL (ref 6–23)
CALCIUM: 9.4 mg/dL (ref 8.4–10.5)
CO2: 25 meq/L (ref 19–32)
Chloride: 100 mEq/L (ref 96–112)
Creatinine, Ser: 0.68 mg/dL (ref 0.50–1.10)
GFR calc Af Amer: >90 mL/min (ref >90)
GFR calc non Af Amer: >90 mL/min (ref >90)
GLUCOSE: 97 mg/dL (ref 70–99)
Potassium: 4.5 mEq/L (ref 3.7–5.3)
Sodium: 139 mEq/L (ref 137–147)

## 2014-02-25 LAB — CBC WITH DIFFERENTIAL/PLATELET
BASOS ABS: 0.1 10*3/uL (ref 0.0–0.1)
Basophils Relative: 1 % (ref 0–1)
EOS ABS: 0.4 10*3/uL (ref 0.0–0.7)
Eosinophils Relative: 4 % (ref 0–5)
HCT: 20.9 % — ABNORMAL LOW (ref 36.0–46.0)
HEMOGLOBIN: 7.1 g/dL — AB (ref 12.0–15.0)
LYMPHS PCT: 32 % (ref 12–46)
Lymphs Abs: 3.5 10*3/uL (ref 0.7–4.0)
MCH: 30.5 pg (ref 26.0–34.0)
MCHC: 34 g/dL (ref 30.0–36.0)
MCV: 89.7 fL (ref 78.0–100.0)
MONOS PCT: 8 % (ref 3–12)
Monocytes Absolute: 0.9 10*3/uL (ref 0.1–1.0)
NEUTROS PCT: 55 % (ref 43–77)
Neutro Abs: 5.9 10*3/uL (ref 1.7–7.7)
Platelets: 607 10*3/uL — ABNORMAL HIGH (ref 150–400)
RBC: 2.33 MIL/uL — ABNORMAL LOW (ref 3.87–5.11)
RDW: 21.7 % — ABNORMAL HIGH (ref 11.5–15.5)
WBC: 10.8 10*3/uL — AB (ref 4.0–10.5)

## 2014-02-25 LAB — LACTATE DEHYDROGENASE: LDH: 317 U/L — AB (ref 94–250)

## 2014-02-25 MED ORDER — OXYCODONE HCL 10 MG PO TABS: 15.0000 mg | ORAL_TABLET | Freq: Four times a day (QID) | ORAL | Status: DC | PRN

## 2014-02-25 MED ORDER — SULINDAC 150 MG PO TABS: 150.0000 mg | ORAL_TABLET | Freq: Two times a day (BID) | ORAL | Status: DC | PRN

## 2014-02-25 NOTE — Plan of Care (Signed)
Problem: Phase III Progression Outcomes Goal: Pain controlled on oral analgesia Outcome: Progressing Pain 6-7 per patient. Weaning IV medication and on oxyir around the clock.

## 2014-02-25 NOTE — Care Management Note (Signed)
CARE MANAGEMENT NOTE 02/25/2014  Patient:  DONELLE, HISE   Account Number:  [id number]  Date Initiated:  02/21/2014  Documentation initiated by:  Marney Doctor  Subjective/Objective Assessment:   Pt admitted with [facility]     Action/Plan:   From home with family   Anticipated DC Date:  02/23/2014   Anticipated DC Plan:  [place]  CM consult      Choice offered to / List presented to:             Status of service:  In process, will continue to follow Medicare Important Message given?  YES (If response is "NO", the following Medicare IM given date fields will be blank) Date Medicare IM given:  02/21/2014 Medicare IM given by:  Marney Doctor Date Additional Medicare IM given:  02/24/2014 Additional Medicare IM given by:  [facility] Danielle Mink  Discharge Disposition:    Per UR Regulation:  Reviewed for med. necessity/level of care/duration of stay  If discussed at [place] of Stay Meetings, dates discussed:   02/25/2014    Comments:  02/21/14 Marney Doctor RN,BSN,NCM Met with pt about DC needs.  None noted at this time.  CM will continue to follow.

## 2014-02-25 NOTE — Progress Notes (Signed)
55ml of High concentration Dilaudid PCA wasted in sink after patient was discharged. Witnessed by Trenda Moots, RN

## 2014-02-25 NOTE — Progress Notes (Signed)
Pt discharged home in stable condition. Discharge instructions and scripts given. Pt verbalized understanding 

## 2014-02-25 NOTE — Discharge Summary (Signed)
[name] MRN: [id number] DOB/AGE: 1991/01/29 23 y.o.  Admit date: 02/16/2014 Discharge date: 02/25/2014  Primary Care Physician:  [name]., MD   Discharge Diagnoses:   Patient Active Problem List   Diagnosis Date Noted  . Mood swings 11/08/2011    Priority: High  . Depression 01/06/2011    Priority: High  . Sickle cell disease 01/08/2009    Priority: High  . Anemia 10/05/2012    Priority: Medium  . 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 Sickle cell anemia 01/29/2014  . Tobacco dependence 08/30/2013  . Frequent stools 08/27/2013  . Pain, joint, upper arm, right 08/27/2013  . Right groin pain 08/15/2013  . Nausea alone 08/15/2013  . Abscess of right groin 08/15/2013  . Sickle cell crisis 07/31/2013  . Hyperkalemia 07/02/2013  . Foul smelling vaginal discharge 06/19/2013  . OCP (oral contraceptive pills) initiation 06/19/2013  . Unprotected sexual intercourse 06/19/2013  . Leg pain 05/28/2013  . Sickle cell anemia with pain 05/28/2013  . Unspecified constipation 04/25/2013  . 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 Chronic pain 02/26/2013  . Immunization due 02/26/2013  . Protein-calorie malnutrition, severe 02/11/2013  . Streptococcal sore throat 12/31/2012    Class: Acute  . Fever, unspecified 10/06/2012  . Leukocytosis, unspecified 10/05/2012  . Sickle cell anemia with crisis   . Chronic back pain   . Leukocytosis 03/31/2012  . 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 Sickle cell pain crisis 03/29/2012  . Headache 02/13/2012  . Nausea & vomiting 02/13/2012  . Tachycardia 11/22/2011  . Right thigh pain 11/08/2011  . Migraines 11/08/2011  . Vaso-occlusive sickle cell crisis 10/08/2011  . Overweight(278.02) 05/24/2011  . Stress 04/29/2011  . GERD (gastroesophageal reflux disease) 02/17/2011  . Back pain 09/17/2010  . Active smoker 08/09/2010  . 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 May take with food to minimize GI side effects.     methadone 5 MG tablet  Commonly known as:  DOLOPHINE  Take 1 tablet (5 mg total) by mouth at bedtime.     Oxycodone HCl 10 MG Tabs  Take 1.5 tablets (15 mg total) by mouth every 6 (six) hours as needed (moderate pain).     sulindac 150 MG tablet  Commonly known as:  CLINORIL  Take 1 tablet (150 mg total) by mouth 2 (two) times daily as needed (Pain in right thigh).          Consults:     SIGNIFICANT DIAGNOSTIC STUDIES:  Dg Chest 2 View  02/16/2014   CLINICAL DATA:  Sickle cell crisis.  Nonsmoker.  EXAM: CHEST  2 VIEW  COMPARISON:  01/29/2014  FINDINGS: The heart size and mediastinal contours are within normal limits. Both lungs are clear. Biconcave vertebral bodies compatible with known sickle cell disease. Right upper quadrant surgical clips.  IMPRESSION: No active cardiopulmonary disease.   Electronically Signed   By: Marin Olp M.D.   On: 02/16/2014 20:54   Dg Chest 2 View  01/29/2014   CLINICAL DATA:  Sickle cell crisis.  Chest pain.  EXAM: CHEST  2 VIEW  COMPARISON:  Chest radiograph performed 01/13/2014  FINDINGS: The lungs are well-aerated and clear. There is no evidence of focal opacification, pleural effusion or pneumothorax.  The heart is normal in size; the mediastinal contour is within normal limits. No acute osseous abnormalities are seen. H-shaped vertebral bodies are compatible with the patient's known sickle cell disease. Clips are noted within the right upper quadrant, reflecting prior cholecystectomy.  IMPRESSION: No acute cardiopulmonary process seen.   Electronically Signed   By: Garald Balding M.D.   On: 01/29/2014 01:12   Dg Knee Complete 4 Views Left  01/29/2014   CLINICAL DATA:  Left knee pain. No injury. History of sickle cell disease.  EXAM: LEFT KNEE -  COMPLETE 4+ VIEW  COMPARISON:  05/28/2013  FINDINGS: Subtle overall increased bone density compatible with history of sickle cell disease. No evidence of fracture, dislocation or joint effusion. Subtle stable sclerosis over the medial femoral condyle.  IMPRESSION: No acute findings.   Electronically Signed   By: Marin Olp M.D.   On: 01/29/2014 11:57    No results found for this or any previous visit (from the past 240 hour(s)).  BRIEF ADMITTING H & P: [name] is [name] 23 y.o. female has [name] past medical history significant for sickle cell disease presents to the ED with bilateral arm and shoulder pain. She was just seen this morning, received IV hydration and pain medications, felt better and was discharged home. Throughout the day, however, despite taking her home doses of pain medication she felt like her pain is increasingly severe and decided to come back to the ER. She denies any fevers or chills, denies any abdominal pain, nausea or vomiting, and states that her pain is typical of her crises. She is afebrile in the ED, has mild leukocytosis which is her baseline. [name] CXR is pending however she has no respiratory symptoms and is not hypoxic. TRH asked for admission for sickle cell crisis.    Hospital Course:  Present on Admission:  . Sickle cell anemia with crisis: Pt was admitted with significant pain to the right thigh and hip which is most likely secondary to bone infarct with osteonecrosis. The bone infarct prolonged her hospitalization as the pain was very difficult to manage even with high doses of opiates and NSAIDS.  She was managed with weight based Dilaudid delived via PCA, IV Toradol and IVF. For most of the hospitalization she was unable to ambulate because of her pain. As the pain improved, she was evaluated by PT and once patient was ambulatory with assistive device she was discharged home on Sulindac as prolonged use of high dose opiates is ineffective in the management of  osteonecrosis. She is sent home and is unable to return to work immediately as her job entails standing for prolonged periods of time which she is unable to do presently. Pt is to follow up in the clinic in one week for labs to evaluate her renal function in light of continued NSAID use and also to evaluate her pain and function.   . Gait Abnormality: Secondary to bone infarct and osteonecrosis. Pt evaluated by Pt and rolling walker ordered for patient.  . Sickle cell disease: Pt has not been compliant with Hydrea. She admits to non-compliance without any reason. I will address further in clinic and patient will be required to keep [name] daily Journal re: Sickle Cell Disease and management of self and disease.   . Leukocytosis: No evidence of infection. Felt to be related to crisis.   Disposition and Follow-up:  Pt discharged in stable condition. She is to follow up in the clinic in 1 week.  Discharge Instructions   Activity as tolerated - No restrictions    Complete by:  As directed      Diet general    Complete by:  As directed            DISCHARGE EXAM:  General: Alert, awake, oriented x3, in no acute distress. Well appearing. Vital Signs: BP 110/63, HR 86, T 98.7 F (37.1 C), temperature source Oral, RR 13, height 5\' 1"  (1.549 m), weight 174 lb 9.6 oz (79.198 kg), last menstrual period 02/11/2014, SpO2 92.00%. HEENT: [place]/AT PEERL, EOMI, anicteric  Neck: Trachea midline, no masses, no thyromegal,y no JVD, no carotid bruit  OROPHARYNX: Moist, No exudate/ erythema/lesions.  Heart: Regular rate and rhythm, without murmurs, rubs, gallops. Lungs: Clear to auscultation, no wheezing or rhonchi noted.  Abdomen: Soft, nontender, nondistended, positive bowel sounds, no masses no hepatosplenomegaly noted..  Neuro: No focal neurological deficits noted cranial nerves II through XII grossly intact. Strength functional in bilateral upper and lower extremities.  Musculoskeletal: Tenderness noted at  right posterior thigh and knee.  Psychiatric: Patient alert and oriented x3, good insight and cognition, good recent to remote recall.     Recent Labs  02/23/14 0729 02/25/14 0550  NA 137 139  K 4.2 4.5  CL 100 100  CO2 25 25  GLUCOSE 110* 97  BUN 6 9  CREATININE 0.55 0.68  CALCIUM 9.2 9.4   No results found for this basename: AST, ALT, ALKPHOS, BILITOT, PROT, ALBUMIN,  in the last 72 hours No results found for this basename: LIPASE, AMYLASE,  in the last 72 hours  Recent Labs  02/23/14 0729 02/24/14 0510 02/25/14 0550  WBC 11.0*  --  10.8*  NEUTROABS 7.3  --  5.9  HGB 7.1* 6.8* 7.1*  HCT 21.1* 19.7* 20.9*  MCV 87.6  --  89.7  PLT 369  --  607*   Time spent in decision making and face to face exceeded 30 minutes. Signed: Lucendia Leard [name]. 02/25/2014, 10:05 AM

## 2014-03-09 IMAGING — US US ABDOMEN COMPLETE
1 series · 14 of 25 positions shown · non-contrast
Comparison: CT of the abdomen and pelvis performed 06/22/2012, and
abdominal ultrasound performed 03/14/2012

CLINICAL DATA: Right upper quadrant abdominal pain.

EXAM:
ULTRASOUND ABDOMEN COMPLETE

[Series 1: us abdomen complete · 0.25mm/px · 14 of 69 slices shown]
[im 1/69]
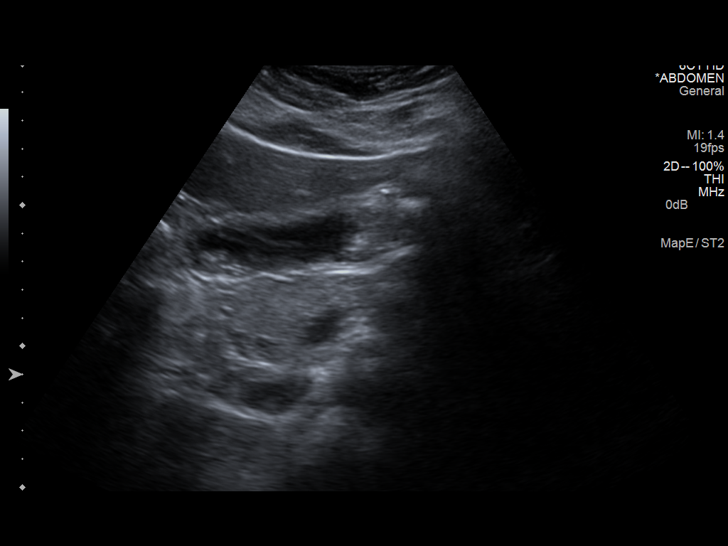
[im 6/69]
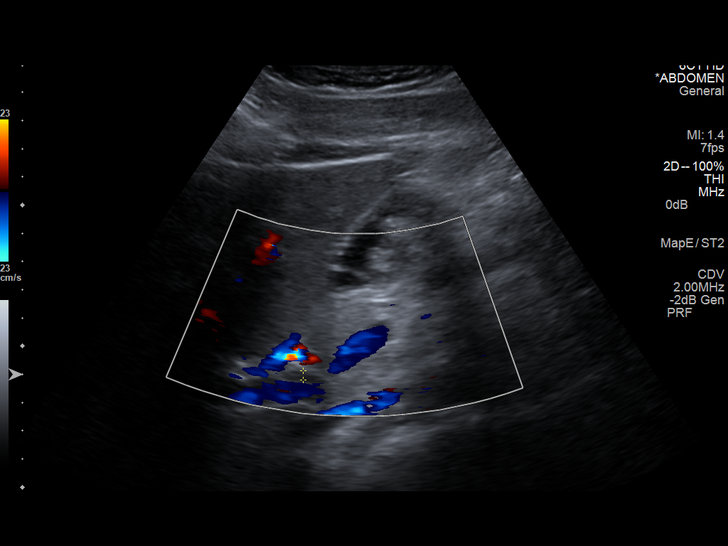
[im 12/69]
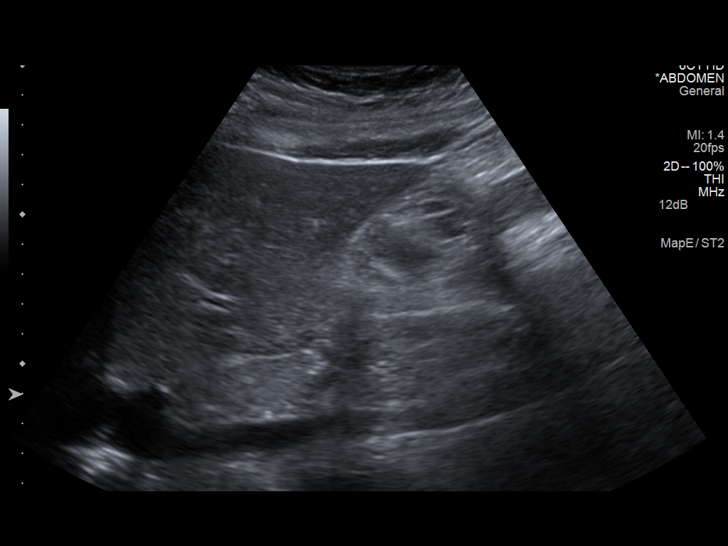
[im 18/69]
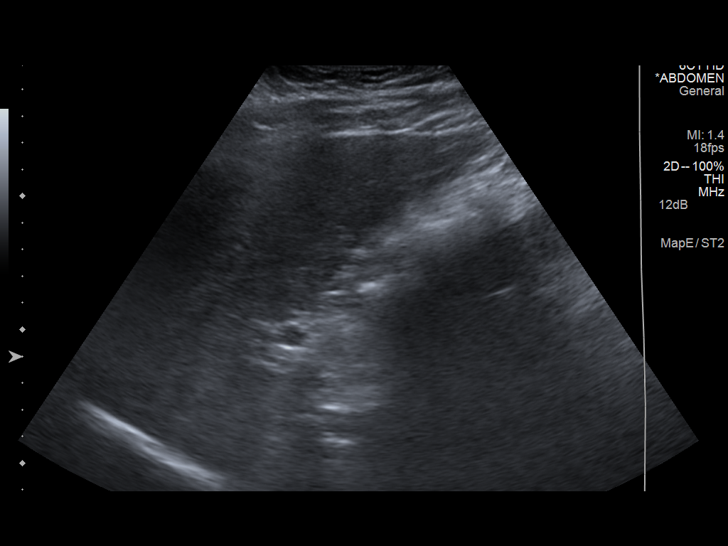
[im 23/69]
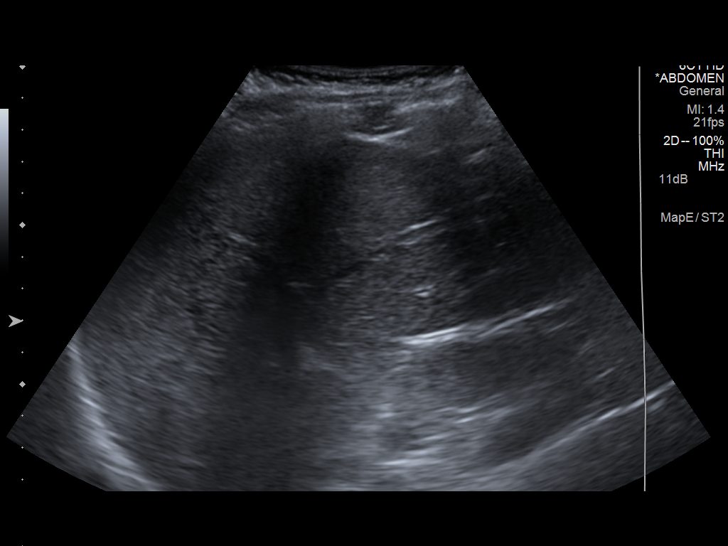
[im 26/69]
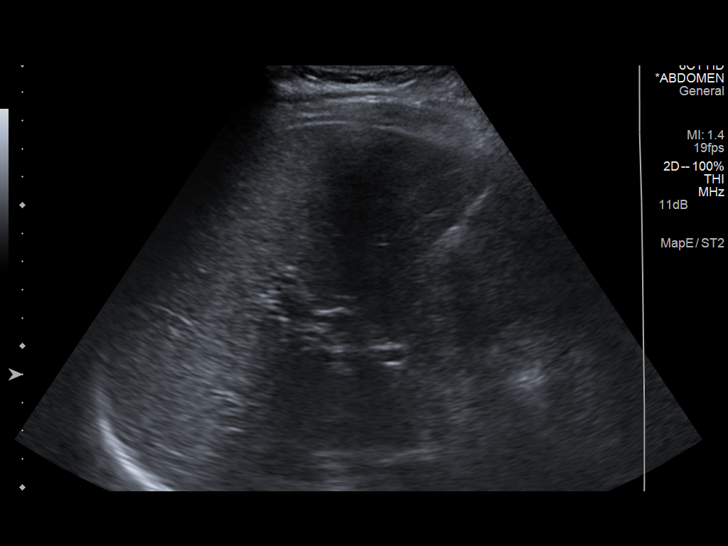
[im 32/69]
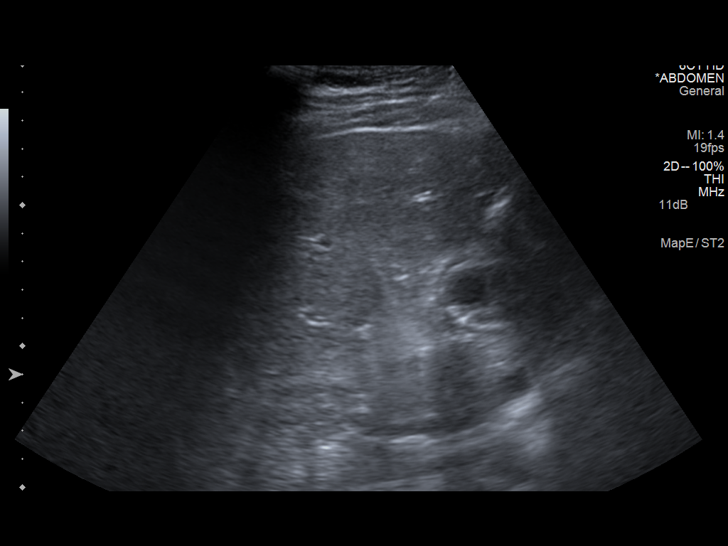
[im 37/69]
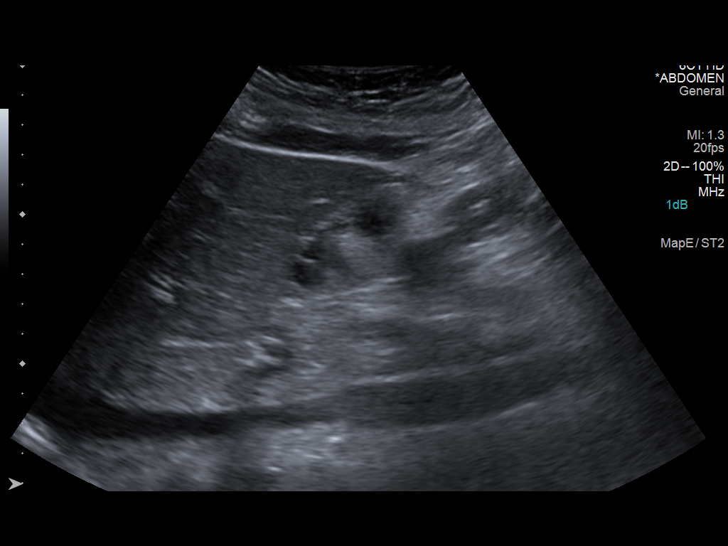
[im 43/69]
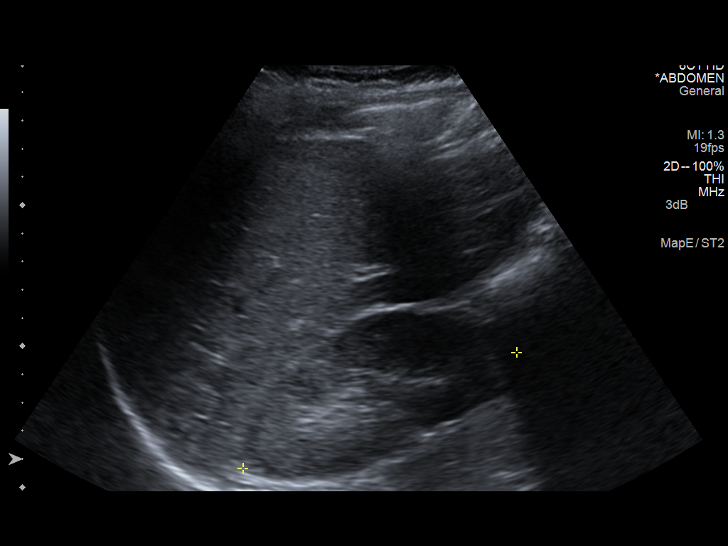
[im 46/69]
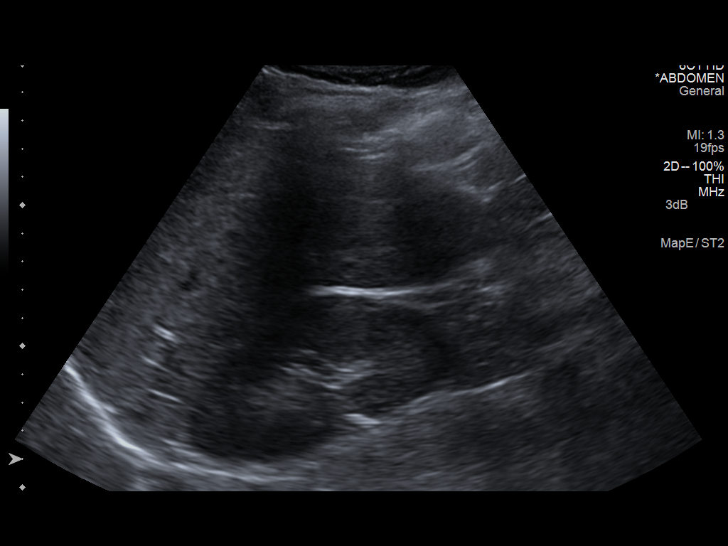
[im 52/69]
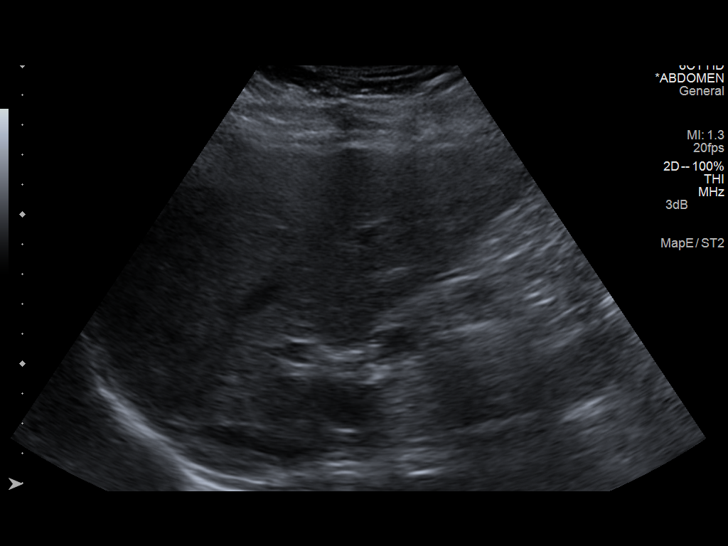
[im 57/69]
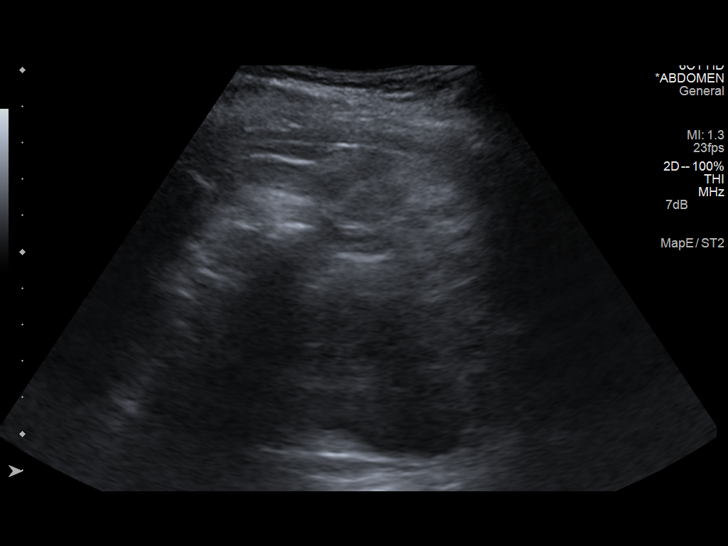
[im 63/69]
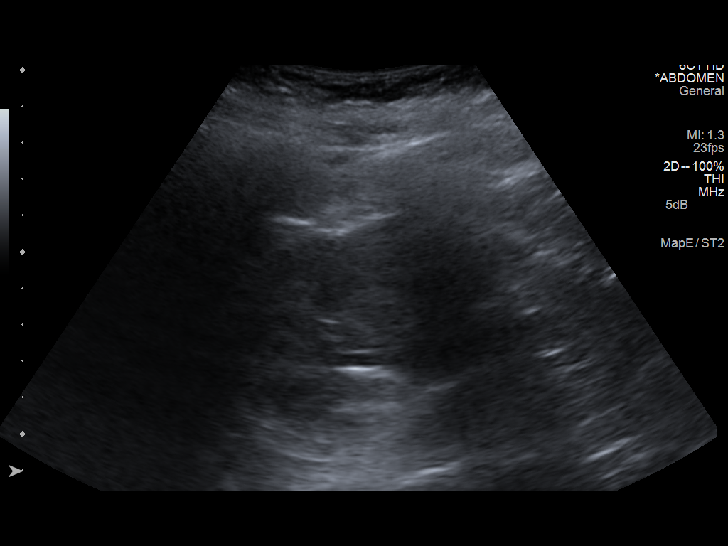
[im 69/69]
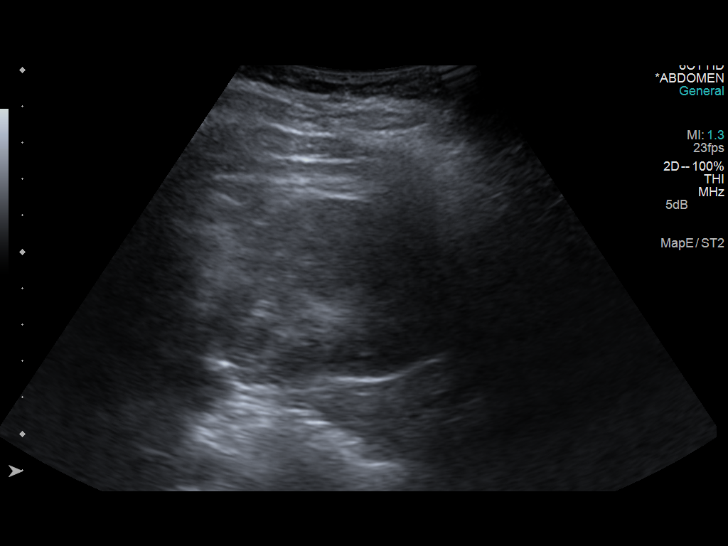

[14 of 25 positions shown; findings below may reference images not displayed]

FINDINGS: Gallbladder:

Status post cholecystectomy.  No retained stones seen.

Common bile duct:

Diameter: 0.3 cm, within normal limits in caliber.

Liver:

No focal lesion identified. Within normal limits in parenchymal
echogenicity.

IVC:

No abnormality visualized.

Pancreas:

Obscured by overlying bowel gas.

Spleen:

Size within normal limits; somewhat echogenic in appearance, with a
nodular contour. This is unchanged from the prior CT.

Right Kidney:

Length: 10.6 cm. Echogenicity within normal limits. No mass or
hydronephrosis visualized.

Left Kidney:

Length: 10.2 cm. Echogenicity within normal limits. No mass or
hydronephrosis visualized.

Abdominal aorta:

No aneurysm visualized.

Other findings:

None.
IMPRESSION: 1. No acute abnormality seen within the abdomen.
2. Incidental note of chronic nodular transformation of the spleen.

## 2014-03-09 IMAGING — CR DG CHEST 2V
2 series · 2 of 2 positions shown · non-contrast
Comparison: Chest radiograph performed at [DATE]

CLINICAL DATA: Right chest pain; sickle cell crisis.

EXAM:
CHEST  2 VIEW

[w chest pa]
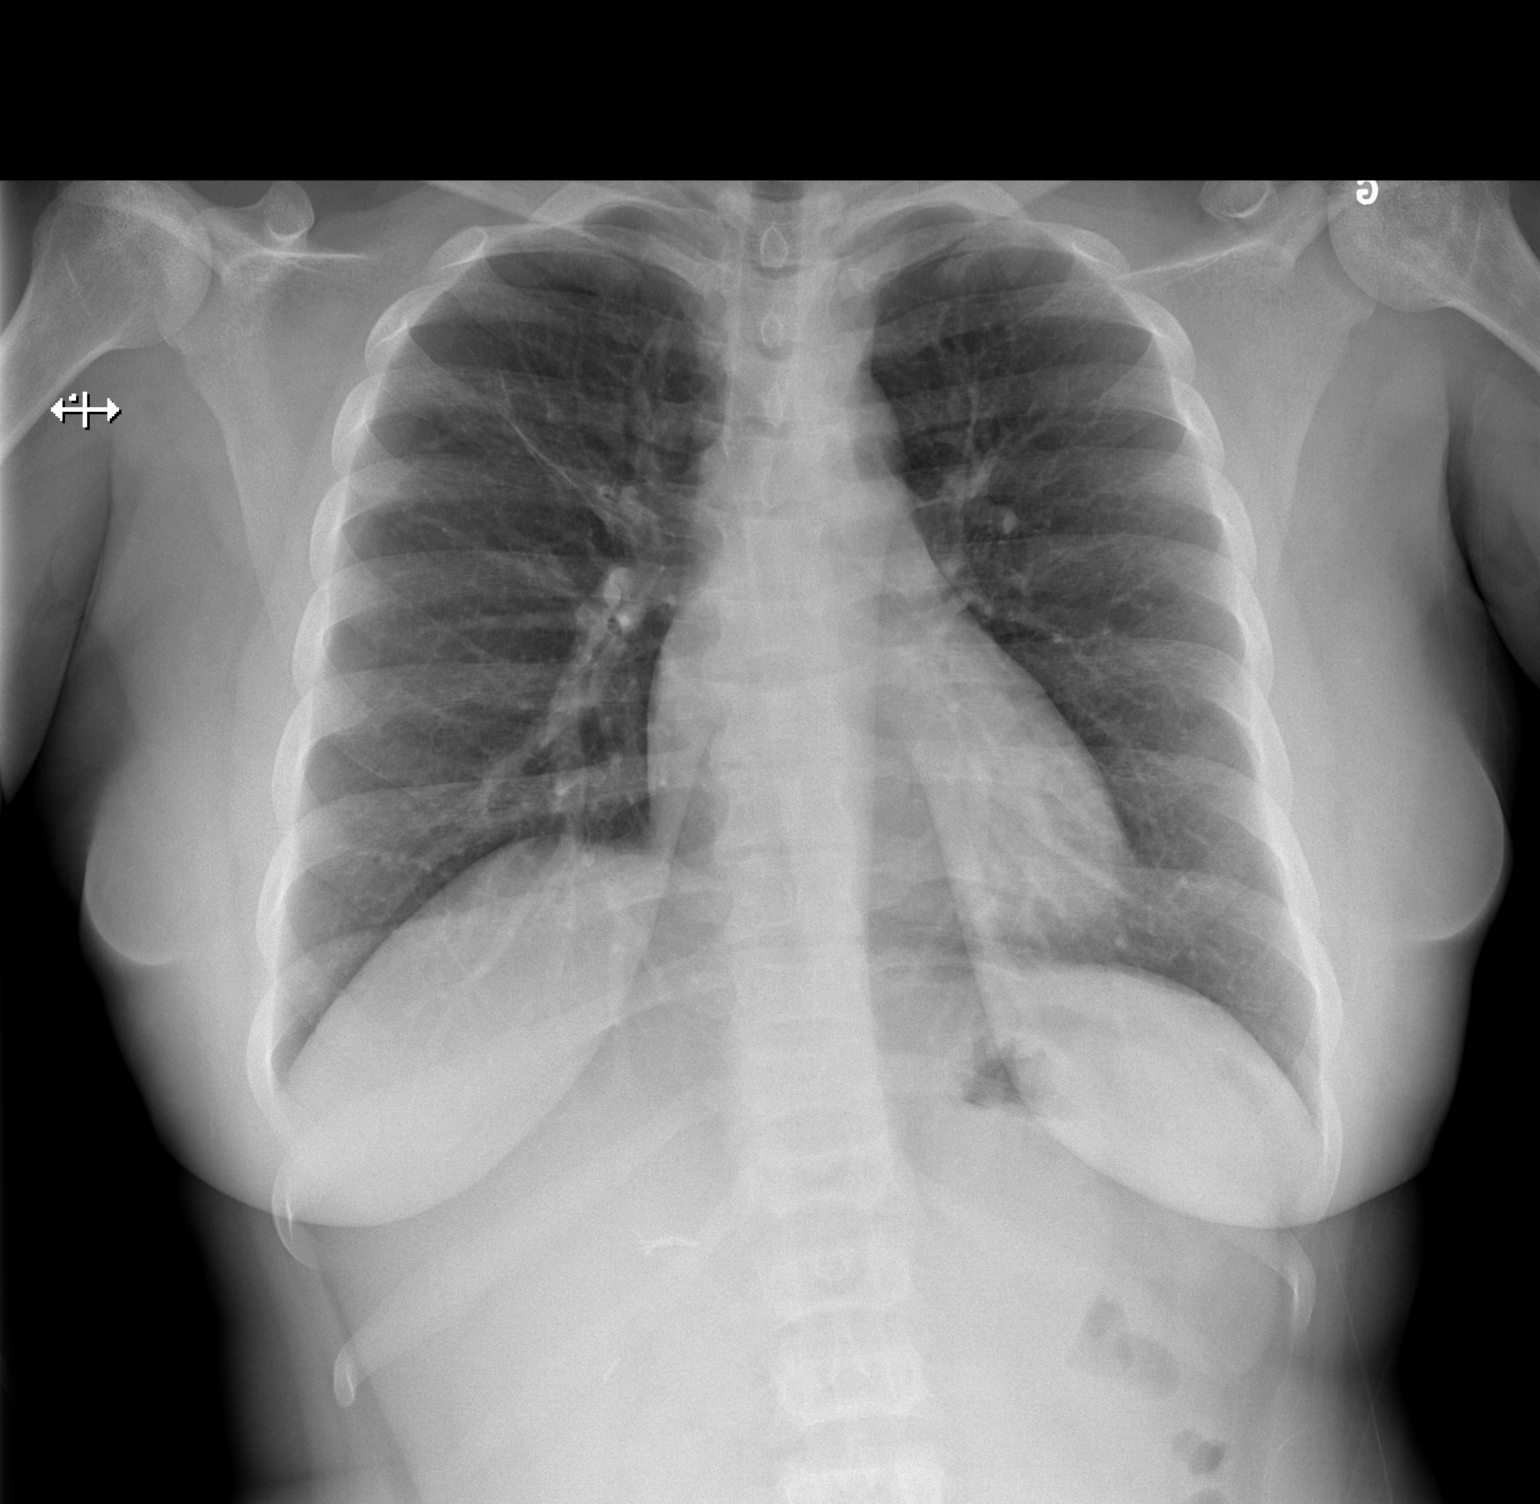

[w chest lat]
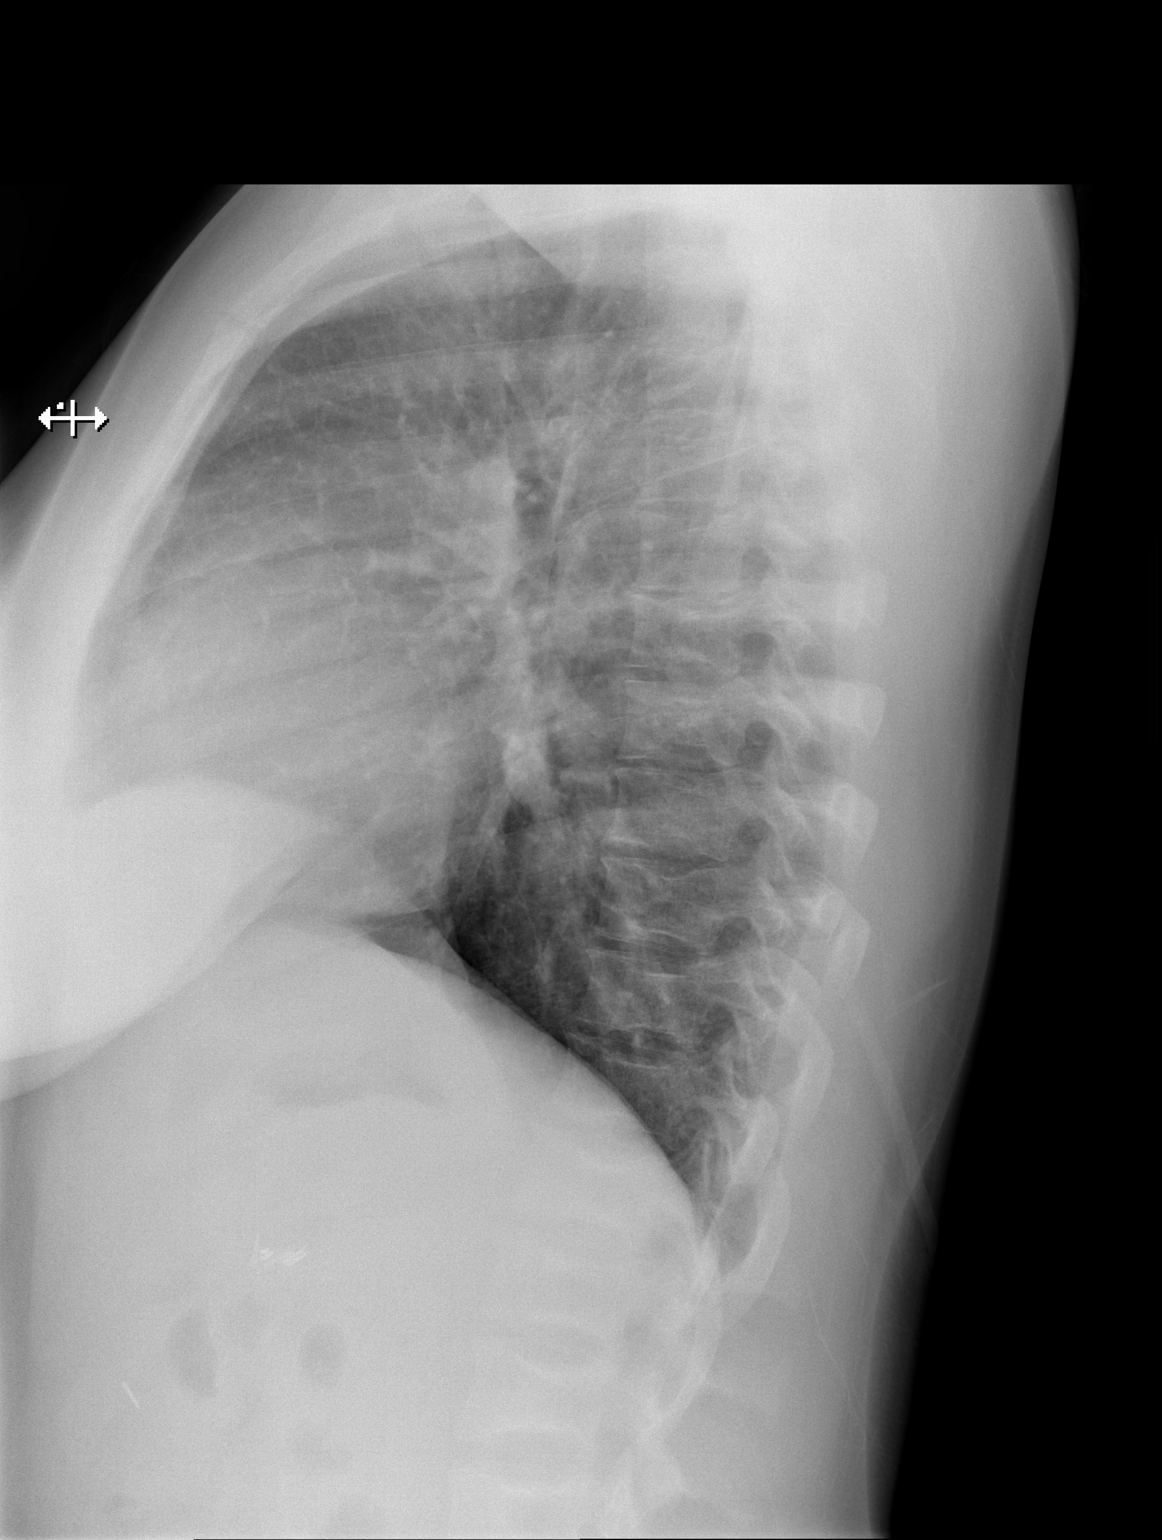

[2 of 2 positions shown; findings below may reference images not displayed]

FINDINGS: The lungs are well-aerated and clear. There is no evidence of focal
opacification, pleural effusion or pneumothorax.

The heart is normal in size; the mediastinal contour is within
normal limits. No acute osseous abnormalities are seen. Clips are
noted within the right upper quadrant, reflecting prior
cholecystectomy. H-shaped vertebral bodies reflect the patient's
sickle cell disease.
IMPRESSION: No acute cardiopulmonary process seen.

## 2014-03-10 ENCOUNTER — Encounter: Payer: Self-pay | Admitting: Family Medicine

## 2014-03-10 ENCOUNTER — Telehealth: Payer: Self-pay | Admitting: Internal Medicine

## 2014-03-10 ENCOUNTER — Other Ambulatory Visit: Payer: Medicare Other

## 2014-03-10 ENCOUNTER — Other Ambulatory Visit: Payer: Self-pay | Admitting: Family Medicine

## 2014-03-10 DIAGNOSIS — D571 Sickle-cell disease without crisis: Secondary | ICD-10-CM

## 2014-03-10 NOTE — Telephone Encounter (Signed)
Refill request for methadone 5mg  and oxycodone 10mg . LOV 10/28/13. Please advise. Thanks!

## 2014-03-11 ENCOUNTER — Other Ambulatory Visit: Payer: Self-pay | Admitting: Internal Medicine

## 2014-03-11 DIAGNOSIS — D571 Sickle-cell disease without crisis: Secondary | ICD-10-CM

## 2014-03-11 DIAGNOSIS — G8929 Other chronic pain: Secondary | ICD-10-CM

## 2014-03-11 LAB — CBC WITH DIFFERENTIAL/PLATELET
BASOS ABS: 0.1 10*3/uL (ref 0.0–0.1)
BASOS PCT: 1 % (ref 0–1)
EOS PCT: 1 % (ref 0–5)
Eosinophils Absolute: 0.1 10*3/uL (ref 0.0–0.7)
HCT: 29.5 % — ABNORMAL LOW (ref 36.0–46.0)
Hemoglobin: 10 g/dL — ABNORMAL LOW (ref 12.0–15.0)
Lymphocytes Relative: 15 % (ref 12–46)
Lymphs Abs: 1.2 10*3/uL (ref 0.7–4.0)
MCH: 32.1 pg (ref 26.0–34.0)
MCHC: 33.9 g/dL (ref 30.0–36.0)
MCV: 94.6 fL (ref 78.0–100.0)
Monocytes Absolute: 0.6 10*3/uL (ref 0.1–1.0)
Monocytes Relative: 7 % (ref 3–12)
Neutro Abs: 6.2 10*3/uL (ref 1.7–7.7)
Neutrophils Relative %: 76 % (ref 43–77)
Platelets: 560 10*3/uL — ABNORMAL HIGH (ref 150–400)
RBC: 3.12 MIL/uL — ABNORMAL LOW (ref 3.87–5.11)
RDW: 20.5 % — AB (ref 11.5–15.5)
WBC: 8.2 10*3/uL (ref 4.0–10.5)

## 2014-03-11 LAB — COMPLETE METABOLIC PANEL WITH GFR
ALBUMIN: 4.5 g/dL (ref 3.5–5.2)
ALK PHOS: 45 U/L (ref 39–117)
ALT: 8 U/L (ref 0–35)
AST: 14 U/L (ref 0–37)
BUN: 6 mg/dL (ref 6–23)
CHLORIDE: 107 meq/L (ref 96–112)
CO2: 24 mEq/L (ref 19–32)
Calcium: 9.2 mg/dL (ref 8.4–10.5)
Creat: 0.6 mg/dL (ref 0.50–1.10)
GFR, Est African American: >89 mL/min
GFR, Est Non African American: >89 mL/min
Glucose, Bld: 106 mg/dL — ABNORMAL HIGH (ref 70–99)
POTASSIUM: 4.1 meq/L (ref 3.5–5.3)
SODIUM: 140 meq/L (ref 135–145)
TOTAL PROTEIN: 7.2 g/dL (ref 6.0–8.3)
Total Bilirubin: 1.3 mg/dL — ABNORMAL HIGH (ref 0.2–1.2)

## 2014-03-11 IMAGING — XA IR FLUORO GUIDE CV MIDLINE PICC *L*
1 series · 2 of 2 positions shown · non-contrast
Comparison: none

CLINICAL DATA: Sickle cell anemia crisis and need for IV access.

[Series 300: ir fluoro guide cv midline picc *l* · 2 of 2 slices shown]
[im 1/2]
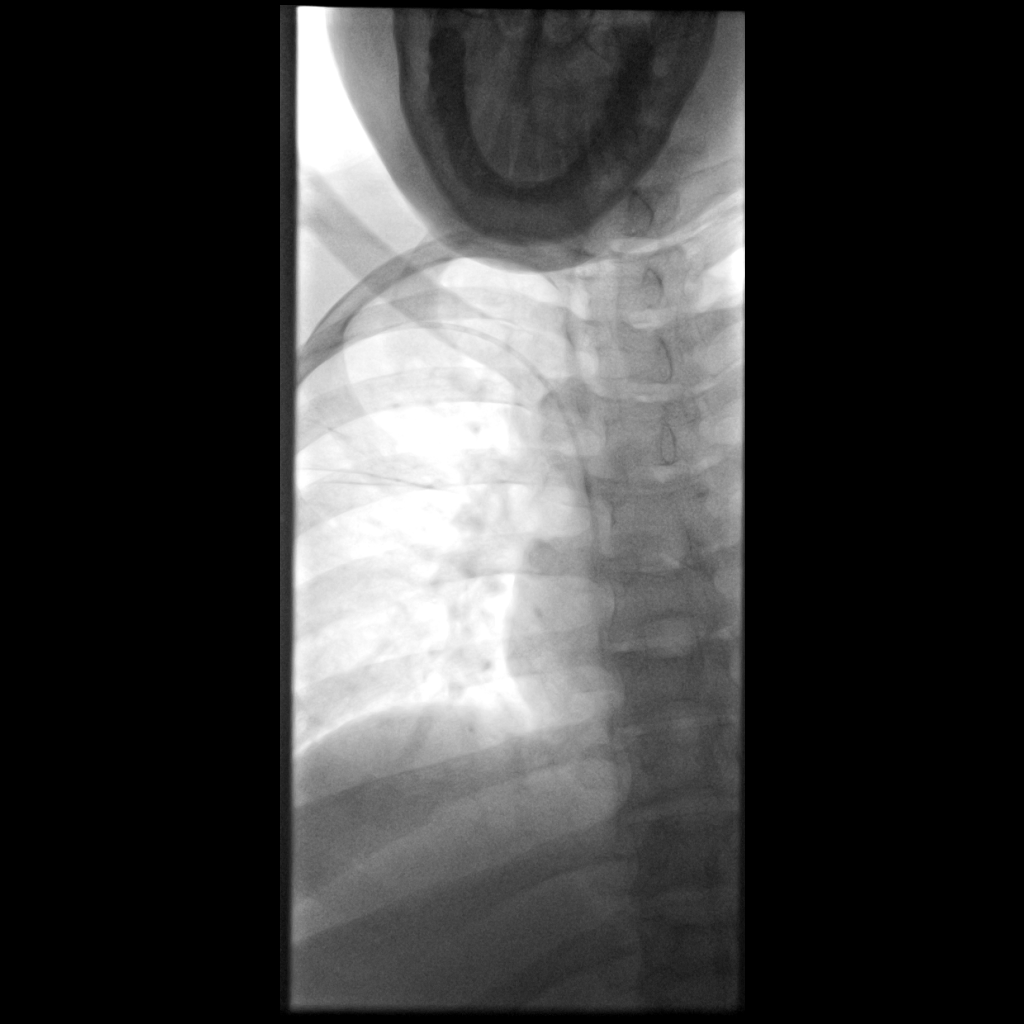
[im 2/2]
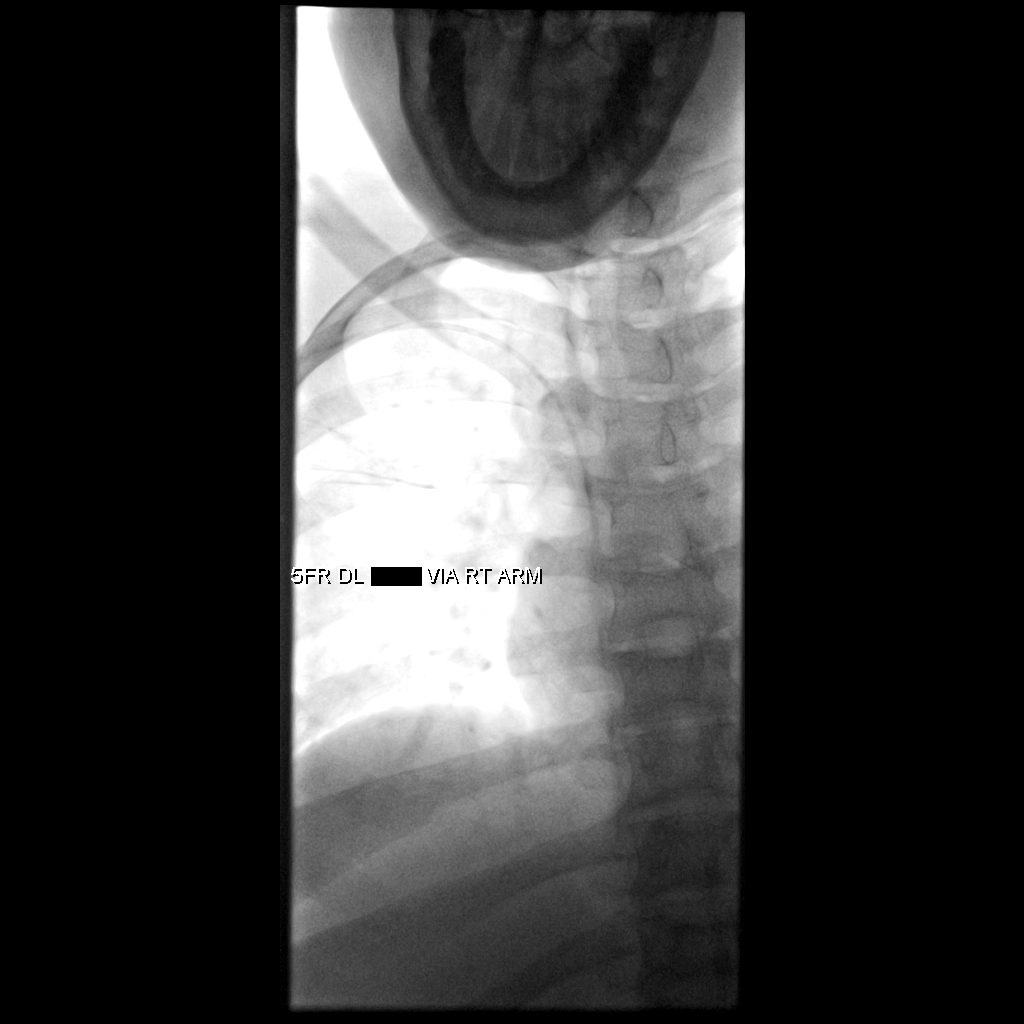

[2 of 2 positions shown; findings below may reference images not displayed]

EXAM:
POWER PICC LINE PLACEMENT WITH ULTRASOUND AND FLUOROSCOPIC GUIDANCE

FLUOROSCOPY TIME:  6 seconds.

PROCEDURE:
The patient was advised of the possible risks and complications and
agreed to undergo the procedure. The patient was then brought to the
angiographic suite for the procedure.

The right arm was prepped with chlorhexidine, draped in the usual
sterile fashion using maximum barrier technique (cap and mask,
sterile gown, sterile gloves, large sterile sheet, hand hygiene and
cutaneous antisepsis) and infiltrated locally with 1% Lidocaine.

Ultrasound demonstrated patency of the right brachial vein, and this
was documented with an image. Under real-time ultrasound guidance,
this vein was accessed with a 21 gauge micropuncture needle and
image documentation was performed. A [DATE] wire was introduced in to
the vein. Over this, a 37 cm, 5.0 French dual lumen power injectable
PICC was advanced to the lower SVC/right atrial junction.
Fluoroscopy during the procedure and fluoro spot radiograph confirms
appropriate catheter position. The catheter was flushed and covered
with a sterile dressing.

Complications: None
IMPRESSION: Successful right arm power injectable PICC line placement with
ultrasound and fluoroscopic guidance. The catheter is ready for use.

## 2014-03-11 MED ORDER — OXYCODONE HCL 10 MG PO TABS: 15.0000 mg | ORAL_TABLET | Freq: Four times a day (QID) | ORAL | Status: DC | PRN

## 2014-03-11 MED ORDER — METHADONE HCL 5 MG PO TABS: 5.0000 mg | ORAL_TABLET | Freq: Every day | ORAL | Status: DC

## 2014-03-11 NOTE — Telephone Encounter (Signed)
Prescription filled for Methadone 5 mg #30

## 2014-03-11 NOTE — Progress Notes (Signed)
Prescription filled for Oxycodone 10 mg #135 tabs

## 2014-03-13 IMAGING — CR DG SINUSES COMPLETE 3+V
4 series · 4 of 4 positions shown · non-contrast
Comparison: None.

CLINICAL DATA: Sickle cell disease. Cough, chest congestion,
shortness of breath, leukocytosis. Sinonasal drainage.

EXAM:
PARANASAL SINUSES - COMPLETE 3 + VIEW

[w smv * (1 of 2)]
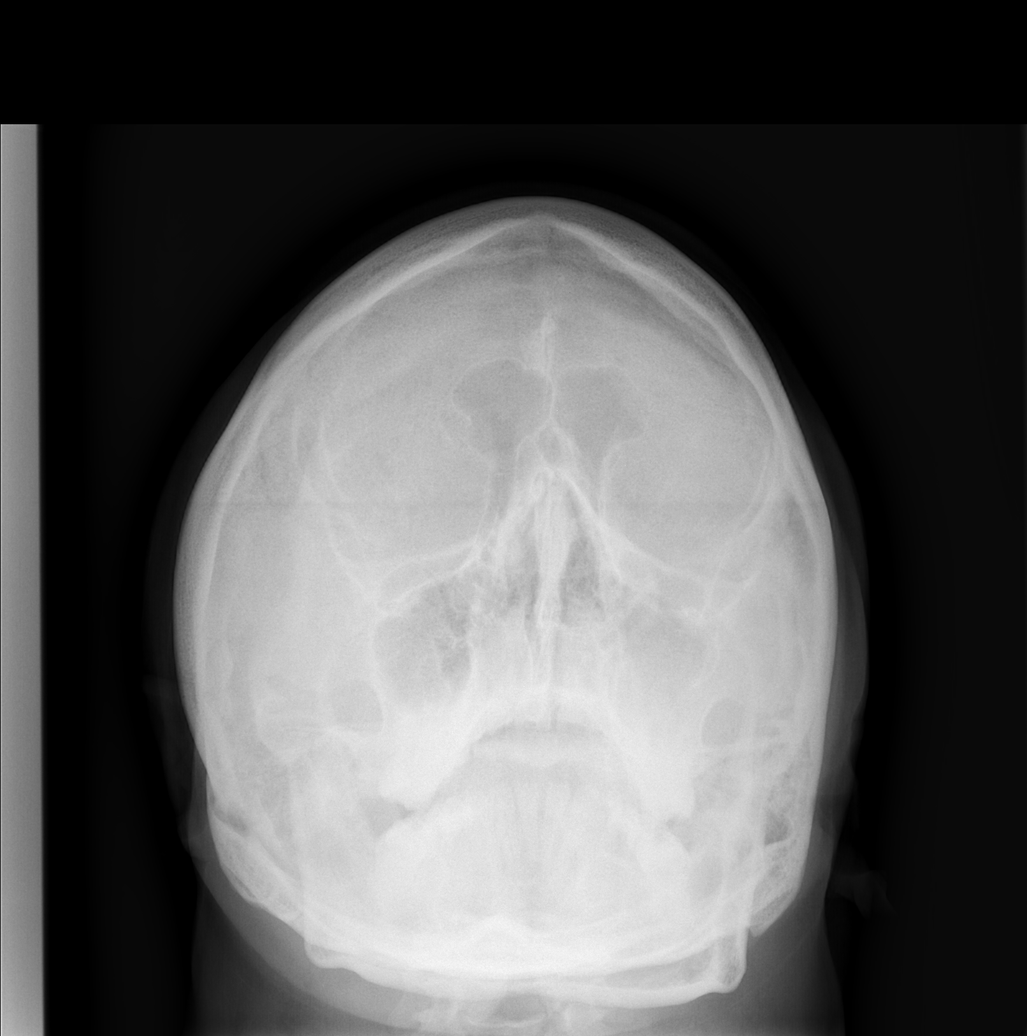

[[person_name] *]
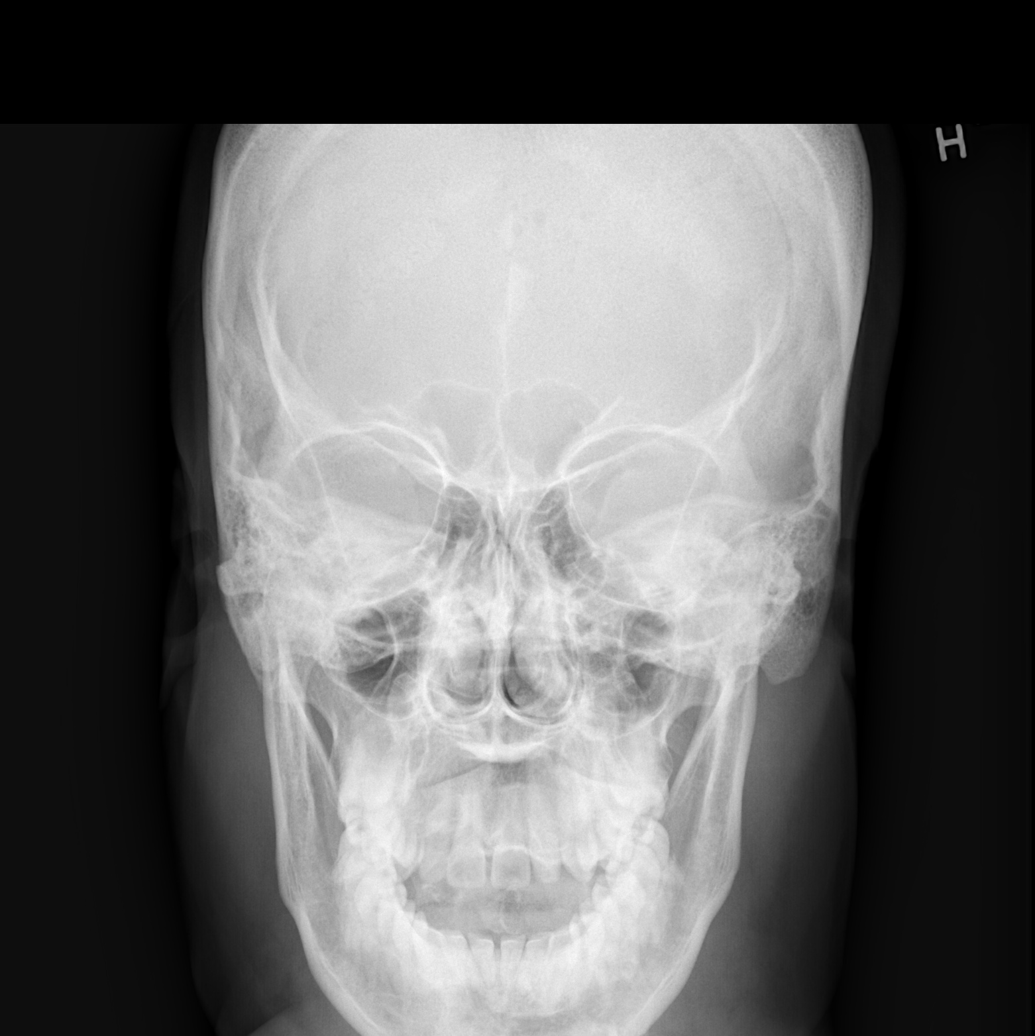

[w skull lat *]
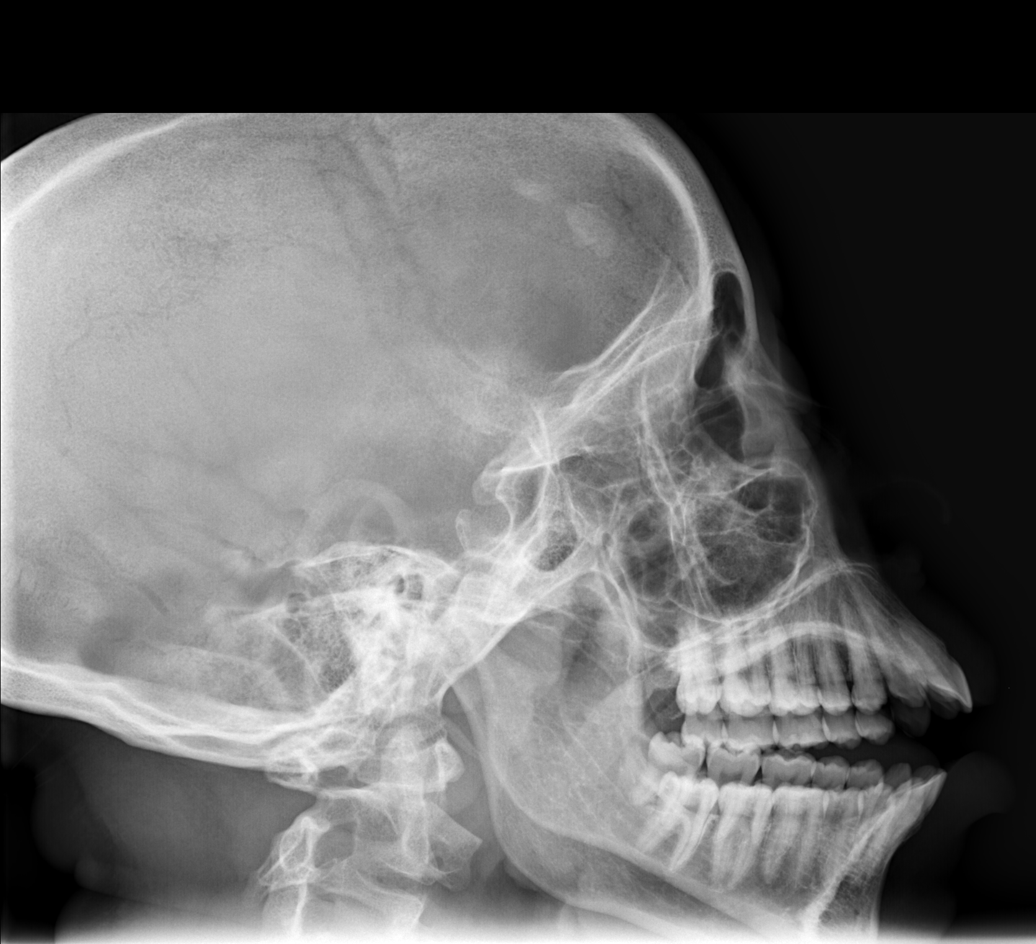

[w smv * (2 of 2)]
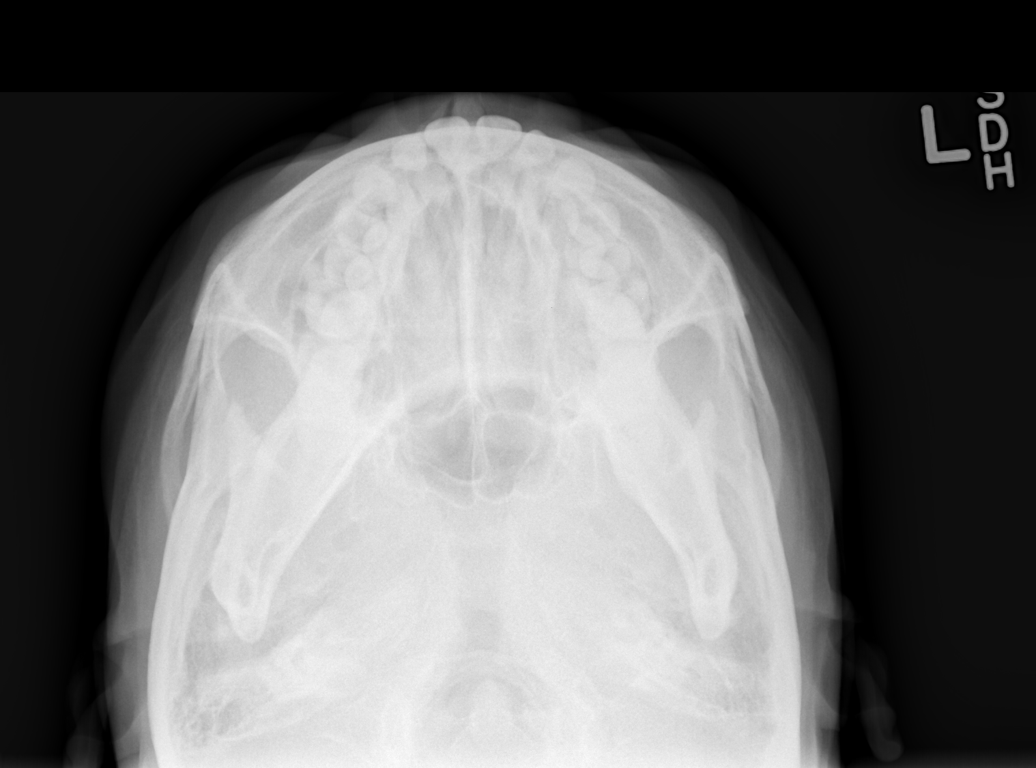

[4 of 4 positions shown; findings below may reference images not displayed]

FINDINGS: Paranasal sinuses well aerated. No visible mucous retention cysts or
polyps. No visible mucosal thickening. No air-fluid levels. Bony
nasal septum midline. Visualized facial bones unremarkable.
IMPRESSION: Normal examination.

## 2014-03-13 IMAGING — CR DG CHEST 2V
2 series · 2 of 2 positions shown · non-contrast
Comparison: 04/24/2013, 04/01/2013, 01/08/2012.

CLINICAL DATA: Sickle cell disease. Cough, chest congestion,
shortness of breath, and leukocytosis.

EXAM:
CHEST  2 VIEW

[w chest pa]
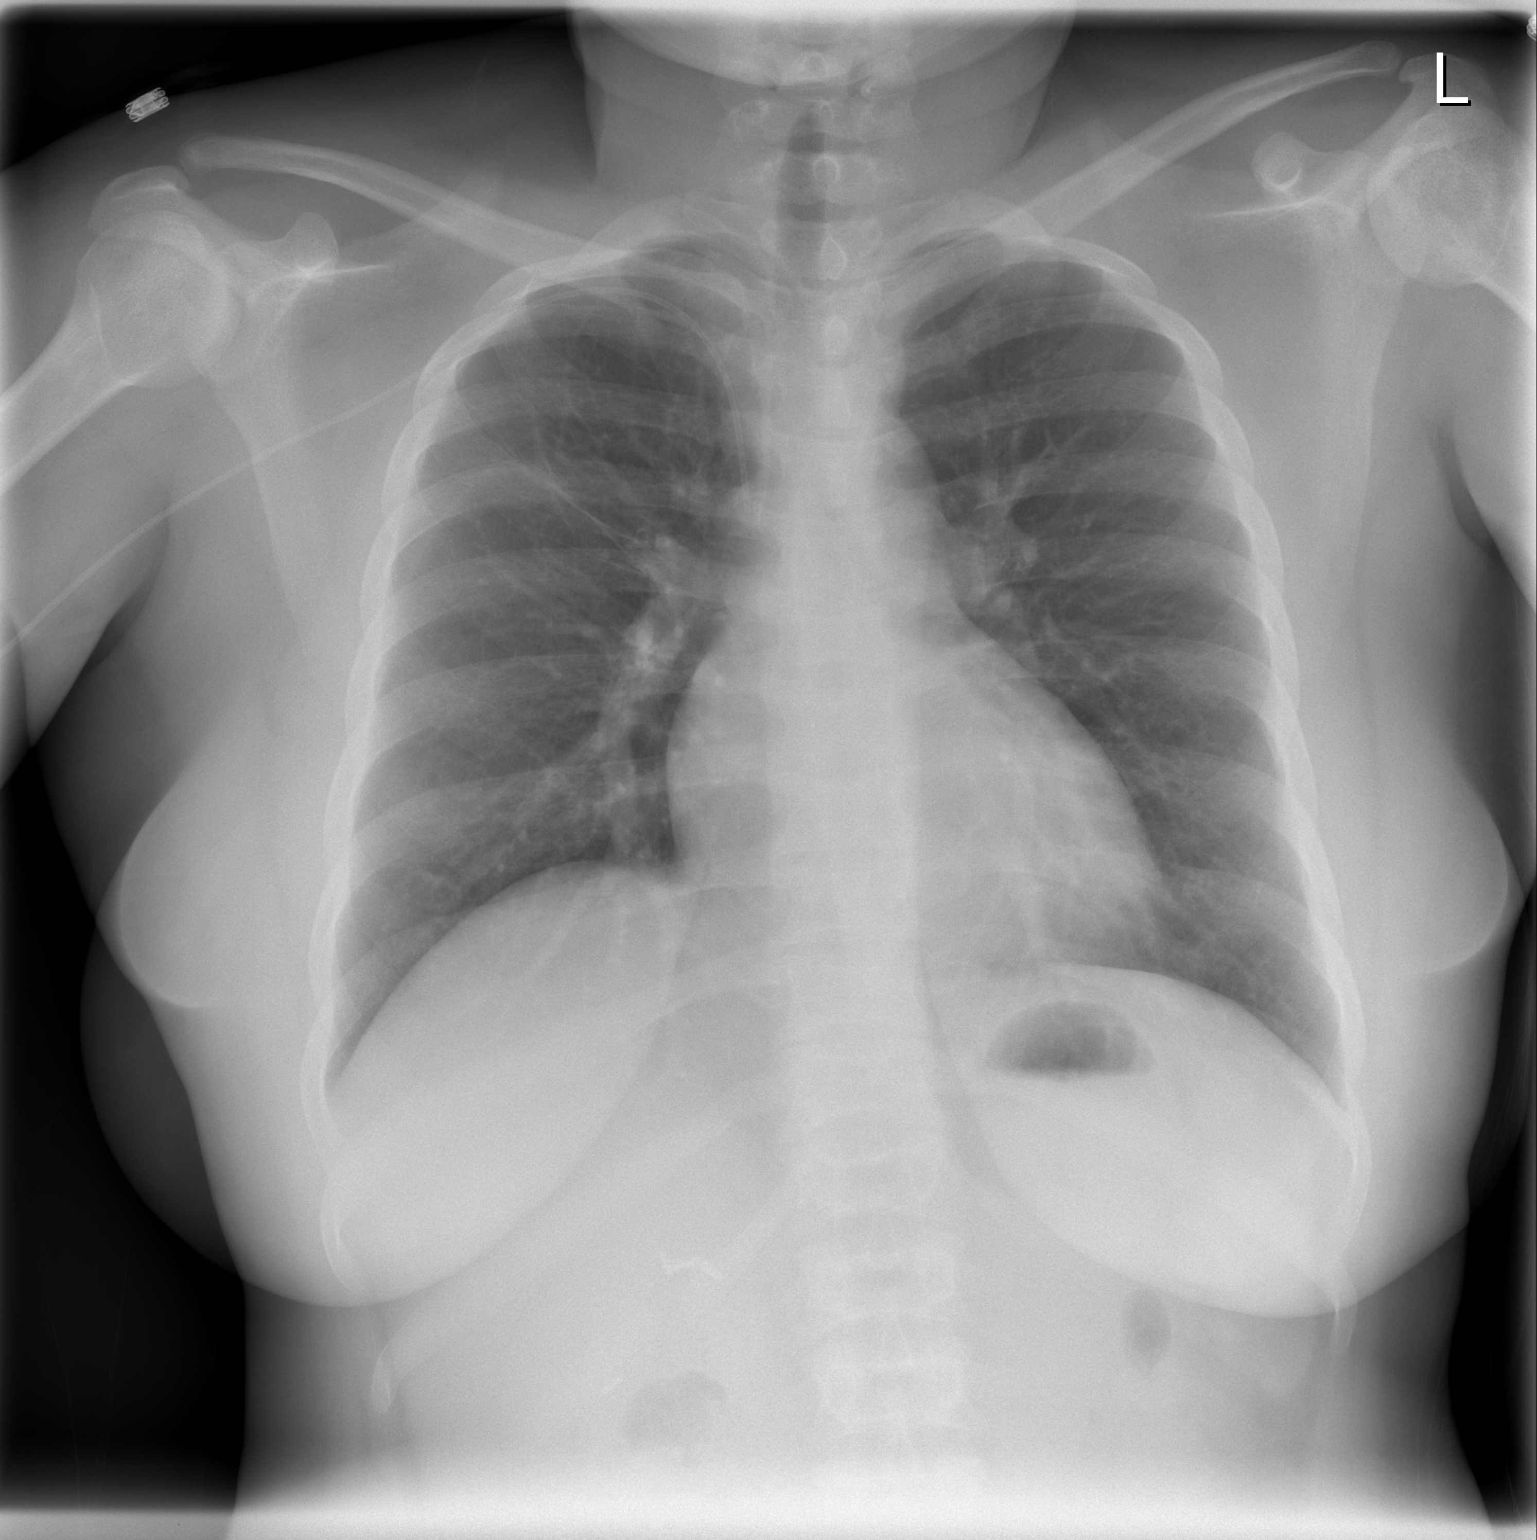

[w chest lat]
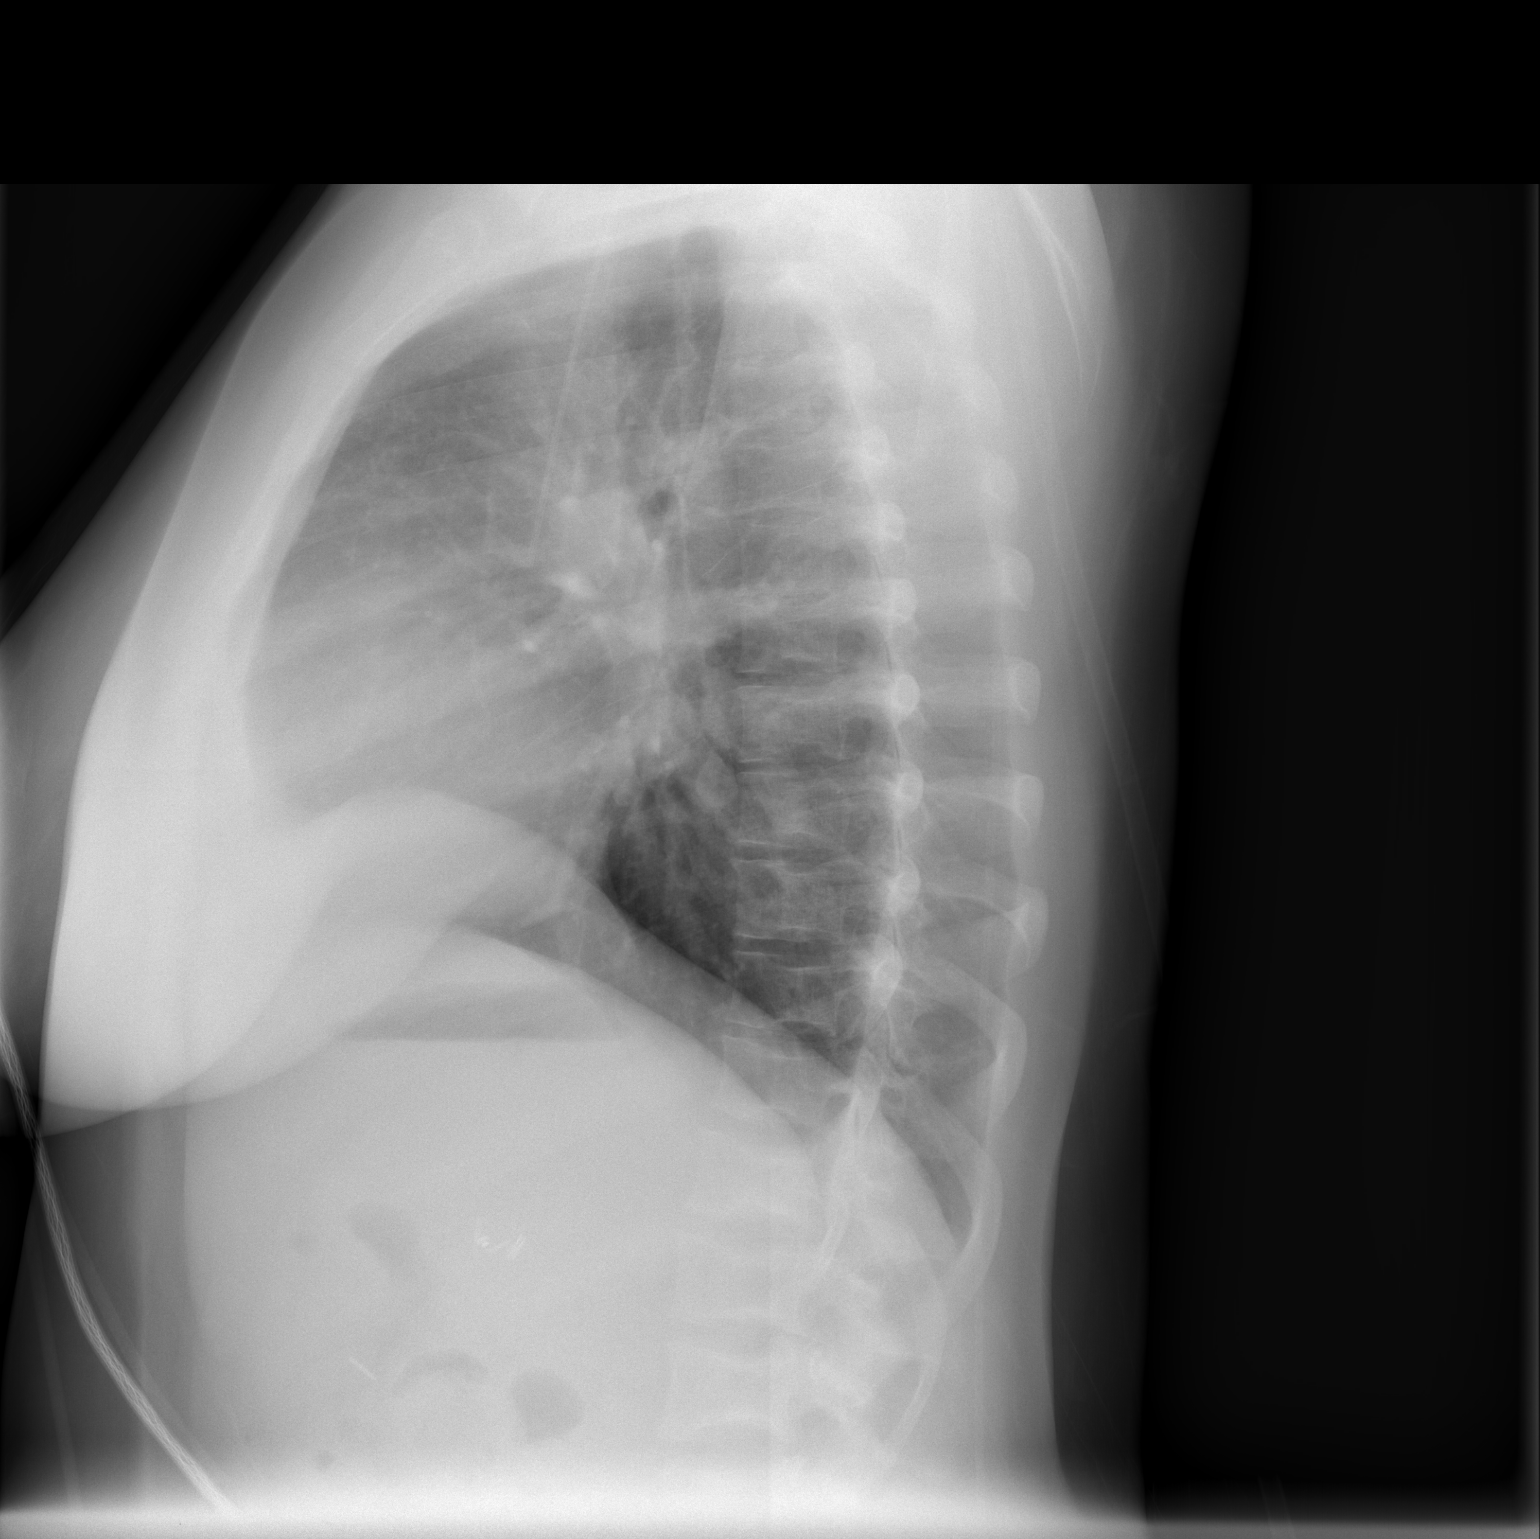

[2 of 2 positions shown; findings below may reference images not displayed]

FINDINGS: Right arm PICC tip in the mid SVC, confirmed on the lateral image.
Cardiomediastinal silhouette unremarkable and unchanged. Lungs
clear. Bronchovascular markings normal. Pulmonary vascularity
normal. No visible pleural effusions. No pneumothorax. Sickle
osteopathy involving the spine.
IMPRESSION: 1. No acute cardiopulmonary disease.
2. Right arm PICC tip in the mid SVC.

## 2014-03-17 ENCOUNTER — Encounter (HOSPITAL_COMMUNITY): Payer: Self-pay | Admitting: Emergency Medicine

## 2014-03-24 ENCOUNTER — Other Ambulatory Visit: Payer: Self-pay | Admitting: Internal Medicine

## 2014-03-24 MED ORDER — HYDROXYUREA 500 MG PO CAPS: 1500.0000 mg | ORAL_CAPSULE | Freq: Every day | ORAL | Status: DC

## 2014-03-24 NOTE — Telephone Encounter (Signed)
Refilled Hydrea to pharmacy via e-script. Thanks!

## 2014-03-25 ENCOUNTER — Other Ambulatory Visit: Payer: Self-pay | Admitting: Internal Medicine

## 2014-03-25 DIAGNOSIS — D571 Sickle-cell disease without crisis: Secondary | ICD-10-CM

## 2014-03-25 DIAGNOSIS — G8929 Other chronic pain: Secondary | ICD-10-CM

## 2014-03-25 MED ORDER — HYDROXYUREA 500 MG PO CAPS: 1500.0000 mg | ORAL_CAPSULE | Freq: Every day | ORAL | Status: DC

## 2014-03-25 MED ORDER — OXYCODONE HCL 10 MG PO TABS: 15.0000 mg | ORAL_TABLET | Freq: Four times a day (QID) | ORAL | Status: DC | PRN

## 2014-03-25 NOTE — Telephone Encounter (Signed)
Refill request for Methadone 5mg  and Oxycodone 10mg . LOV 10/28/13. Please advise. Thanks!

## 2014-03-25 NOTE — Telephone Encounter (Signed)
Refill for hydrea sent in via e-script. Thanks!

## 2014-03-25 NOTE — Telephone Encounter (Signed)
Prescription filled for Oxycodone 10 mg #135 tabs

## 2014-03-28 ENCOUNTER — Telehealth: Payer: Self-pay | Admitting: Internal Medicine

## 2014-03-28 NOTE — Telephone Encounter (Signed)
Received call from Pharmacy stating that patient [name] was at Pharmacy  presenting a prescription written by me when she had just filled a prescription from Dr. Ricke Hey. I reviewed [facility] and in fact she had filled a prescription from Dr. Alyson Ingles on 03/20/2014 and also on 10/20 2015.  In addition to receiving xanax from him on 03/04/2014. I have instructed Pharmacy not to fill the prescription for Oxycodone 10-325 mg as the last prescription written by me for [name] on 03/25/2014.

## 2014-04-02 ENCOUNTER — Telehealth: Payer: Self-pay | Admitting: Internal Medicine

## 2014-04-02 DIAGNOSIS — Z3490 Encounter for supervision of normal pregnancy, unspecified, unspecified trimester: Secondary | ICD-10-CM

## 2014-04-02 DIAGNOSIS — D571 Sickle-cell disease without crisis: Secondary | ICD-10-CM

## 2014-04-02 NOTE — Telephone Encounter (Signed)
Called to schedule appointment for 04/02/2014. No answer, left voicemail.

## 2014-04-09 ENCOUNTER — Inpatient Hospital Stay (HOSPITAL_COMMUNITY)
Admission: EM | Admit: 2014-04-09 | Discharge: 2014-04-18 | DRG: 812 | Disposition: A | Discharge status: Home / Self Care | Payer: Medicare Other | Attending: Internal Medicine | Admitting: Internal Medicine

## 2014-04-09 ENCOUNTER — Encounter (HOSPITAL_COMMUNITY): Payer: Self-pay

## 2014-04-09 DIAGNOSIS — D57219 Sickle-cell/Hb-C disease with crisis, unspecified: Secondary | ICD-10-CM | POA: Diagnosis not present

## 2014-04-09 DIAGNOSIS — R05 Cough: Secondary | ICD-10-CM | POA: Diagnosis not present

## 2014-04-09 DIAGNOSIS — J358 Other chronic diseases of tonsils and adenoids: Secondary | ICD-10-CM | POA: Diagnosis not present

## 2014-04-09 DIAGNOSIS — J020 Streptococcal pharyngitis: Secondary | ICD-10-CM | POA: Diagnosis present

## 2014-04-09 DIAGNOSIS — R1310 Dysphagia, unspecified: Secondary | ICD-10-CM | POA: Diagnosis not present

## 2014-04-09 DIAGNOSIS — Z452 Encounter for adjustment and management of vascular access device: Secondary | ICD-10-CM | POA: Diagnosis not present

## 2014-04-09 DIAGNOSIS — R061 Stridor: Secondary | ICD-10-CM | POA: Diagnosis not present

## 2014-04-09 DIAGNOSIS — D571 Sickle-cell disease without crisis: Secondary | ICD-10-CM | POA: Diagnosis not present

## 2014-04-09 DIAGNOSIS — K5909 Other constipation: Secondary | ICD-10-CM | POA: Diagnosis present

## 2014-04-09 DIAGNOSIS — K219 Gastro-esophageal reflux disease without esophagitis: Secondary | ICD-10-CM | POA: Diagnosis present

## 2014-04-09 DIAGNOSIS — R222 Localized swelling, mass and lump, trunk: Secondary | ICD-10-CM

## 2014-04-09 DIAGNOSIS — J028 Acute pharyngitis due to other specified organisms: Secondary | ICD-10-CM

## 2014-04-09 DIAGNOSIS — D5700 Hb-SS disease with crisis, unspecified: Secondary | ICD-10-CM | POA: Diagnosis not present

## 2014-04-09 DIAGNOSIS — Z9114 Patient's other noncompliance with medication regimen: Secondary | ICD-10-CM | POA: Diagnosis present

## 2014-04-09 DIAGNOSIS — J029 Acute pharyngitis, unspecified: Secondary | ICD-10-CM | POA: Diagnosis not present

## 2014-04-09 DIAGNOSIS — J984 Other disorders of lung: Secondary | ICD-10-CM | POA: Diagnosis not present

## 2014-04-09 DIAGNOSIS — K5901 Slow transit constipation: Secondary | ICD-10-CM | POA: Diagnosis not present

## 2014-04-09 DIAGNOSIS — K5900 Constipation, unspecified: Secondary | ICD-10-CM | POA: Diagnosis present

## 2014-04-09 DIAGNOSIS — Z87891 Personal history of nicotine dependence: Secondary | ICD-10-CM | POA: Diagnosis not present

## 2014-04-09 DIAGNOSIS — A400 Sepsis due to streptococcus, group A: Secondary | ICD-10-CM | POA: Diagnosis not present

## 2014-04-09 DIAGNOSIS — B9689 Other specified bacterial agents as the cause of diseases classified elsewhere: Secondary | ICD-10-CM | POA: Diagnosis not present

## 2014-04-09 DIAGNOSIS — E871 Hypo-osmolality and hyponatremia: Secondary | ICD-10-CM | POA: Diagnosis present

## 2014-04-09 DIAGNOSIS — R509 Fever, unspecified: Secondary | ICD-10-CM

## 2014-04-09 DIAGNOSIS — R109 Unspecified abdominal pain: Secondary | ICD-10-CM | POA: Diagnosis not present

## 2014-04-09 DIAGNOSIS — R52 Pain, unspecified: Secondary | ICD-10-CM | POA: Diagnosis not present

## 2014-04-09 DIAGNOSIS — K651 Peritoneal abscess: Secondary | ICD-10-CM | POA: Diagnosis not present

## 2014-04-09 DIAGNOSIS — D473 Essential (hemorrhagic) thrombocythemia: Secondary | ICD-10-CM | POA: Diagnosis present

## 2014-04-09 DIAGNOSIS — I878 Other specified disorders of veins: Secondary | ICD-10-CM

## 2014-04-09 LAB — COMPREHENSIVE METABOLIC PANEL
ALBUMIN: 4.3 g/dL (ref 3.5–5.2)
ALK PHOS: 63 U/L (ref 39–117)
ALT: 16 U/L (ref 0–35)
ANION GAP: 16 — AB (ref 5–15)
AST: 21 U/L (ref 0–37)
BUN: 4 mg/dL — AB (ref 6–23)
CO2: 20 mEq/L (ref 19–32)
CREATININE: 0.64 mg/dL (ref 0.50–1.10)
Calcium: 9.4 mg/dL (ref 8.4–10.5)
Chloride: 100 mEq/L (ref 96–112)
GFR calc Af Amer: >90 mL/min (ref >90)
GFR calc non Af Amer: >90 mL/min (ref >90)
Glucose, Bld: 103 mg/dL — ABNORMAL HIGH (ref 70–99)
POTASSIUM: 3.9 meq/L (ref 3.7–5.3)
Sodium: 136 mEq/L — ABNORMAL LOW (ref 137–147)
Total Bilirubin: 1.4 mg/dL — ABNORMAL HIGH (ref 0.3–1.2)
Total Protein: 8.1 g/dL (ref 6.0–8.3)

## 2014-04-09 LAB — LACTATE DEHYDROGENASE: LDH: 319 U/L — AB (ref 94–250)

## 2014-04-09 LAB — CBC WITH DIFFERENTIAL/PLATELET
BASOS PCT: 0 % (ref 0–1)
Basophils Absolute: 0 10*3/uL (ref 0.0–0.1)
EOS ABS: 0 10*3/uL (ref 0.0–0.7)
Eosinophils Relative: 0 % (ref 0–5)
HCT: 29.2 % — ABNORMAL LOW (ref 36.0–46.0)
HEMOGLOBIN: 10.2 g/dL — AB (ref 12.0–15.0)
Lymphocytes Relative: 22 % (ref 12–46)
Lymphs Abs: 1.7 10*3/uL (ref 0.7–4.0)
MCH: 32.5 pg (ref 26.0–34.0)
MCHC: 34.9 g/dL (ref 30.0–36.0)
MCV: 93 fL (ref 78.0–100.0)
MONOS PCT: 8 % (ref 3–12)
Monocytes Absolute: 0.6 10*3/uL (ref 0.1–1.0)
NEUTROS PCT: 70 % (ref 43–77)
Neutro Abs: 5.6 10*3/uL (ref 1.7–7.7)
Platelets: 467 10*3/uL — ABNORMAL HIGH (ref 150–400)
RBC: 3.14 MIL/uL — ABNORMAL LOW (ref 3.87–5.11)
RDW: 17.3 % — AB (ref 11.5–15.5)
WBC: 8 10*3/uL (ref 4.0–10.5)

## 2014-04-09 LAB — URINALYSIS, ROUTINE W REFLEX MICROSCOPIC
Bilirubin Urine: NEGATIVE
Glucose, UA: NEGATIVE mg/dL
Hgb urine dipstick: NEGATIVE
Ketones, ur: NEGATIVE mg/dL
Leukocytes, UA: NEGATIVE
Nitrite: NEGATIVE
Protein, ur: NEGATIVE mg/dL
Specific Gravity, Urine: 1.009 (ref 1.005–1.030)
Urobilinogen, UA: 0.2 mg/dL (ref 0.0–1.0)
pH: 6 (ref 5.0–8.0)

## 2014-04-09 LAB — POC URINE PREG, ED: Preg Test, Ur: NEGATIVE

## 2014-04-09 LAB — RETICULOCYTES
RBC.: 3.14 MIL/uL — ABNORMAL LOW (ref 3.87–5.11)
Retic Count, Absolute: 427 K/uL — ABNORMAL HIGH (ref 19.0–186.0)
Retic Ct Pct: 13.6 % — ABNORMAL HIGH (ref 0.4–3.1)

## 2014-04-09 LAB — MAGNESIUM: Magnesium: 2.2 mg/dL (ref 1.5–2.5)

## 2014-04-09 MED ORDER — ONDANSETRON HCL 4 MG/2ML IJ SOLN: 4.0000 mg | Freq: Three times a day (TID) | INTRAMUSCULAR | Status: DC | PRN

## 2014-04-09 MED ORDER — DIPHENHYDRAMINE HCL 25 MG PO CAPS
25.0000 mg | ORAL_CAPSULE | Freq: Four times a day (QID) | ORAL | Status: DC | PRN
  Administered 2014-04-13: 25 mg via ORAL
  Filled 2014-04-09: qty 1

## 2014-04-09 MED ORDER — SODIUM CHLORIDE 0.45 % IV SOLN
INTRAVENOUS | Status: DC
  Administered 2014-04-09 – 2014-04-18 (×18): via INTRAVENOUS

## 2014-04-09 MED ORDER — SODIUM CHLORIDE 0.9 % IV SOLN
25.0000 mg | INTRAVENOUS | Status: DC | PRN
  Filled 2014-04-09 (×2): qty 0.5

## 2014-04-09 MED ORDER — SODIUM CHLORIDE 0.9 % IJ SOLN: 9.0000 mL | INTRAMUSCULAR | Status: DC | PRN

## 2014-04-09 MED ORDER — NALOXONE HCL 0.4 MG/ML IJ SOLN
0.4000 mg | INTRAMUSCULAR | Status: DC | PRN
  Filled 2014-04-09: qty 1

## 2014-04-09 MED ORDER — HYDROMORPHONE HCL 2 MG/ML IJ SOLN
2.0000 mg | Freq: Once | INTRAMUSCULAR | Status: AC
  Administered 2014-04-09: 2 mg via INTRAVENOUS
  Filled 2014-04-09: qty 1

## 2014-04-09 MED ORDER — SENNOSIDES-DOCUSATE SODIUM 8.6-50 MG PO TABS
1.0000 | ORAL_TABLET | Freq: Two times a day (BID) | ORAL | Status: DC
  Administered 2014-04-09 – 2014-04-18 (×13): 1 via ORAL
  Filled 2014-04-09 (×19): qty 1

## 2014-04-09 MED ORDER — SODIUM CHLORIDE 0.9 % IV SOLN: Freq: Once | INTRAVENOUS | Status: DC

## 2014-04-09 MED ORDER — FOLIC ACID 1 MG PO TABS
1.0000 mg | ORAL_TABLET | Freq: Every day | ORAL | Status: DC
  Administered 2014-04-09 – 2014-04-18 (×10): 1 mg via ORAL
  Filled 2014-04-09 (×10): qty 1

## 2014-04-09 MED ORDER — DIPHENHYDRAMINE HCL 12.5 MG/5ML PO ELIX: 12.5000 mg | ORAL_SOLUTION | Freq: Four times a day (QID) | ORAL | Status: DC | PRN

## 2014-04-09 MED ORDER — HYDROMORPHONE HCL 1 MG/ML IJ SOLN
1.0000 mg | Freq: Once | INTRAMUSCULAR | Status: AC
  Administered 2014-04-09: 1 mg via INTRAVENOUS
  Filled 2014-04-09: qty 1

## 2014-04-09 MED ORDER — ENOXAPARIN SODIUM 40 MG/0.4ML ~~LOC~~ SOLN
40.0000 mg | SUBCUTANEOUS | Status: DC
  Administered 2014-04-12 – 2014-04-14 (×3): 40 mg via SUBCUTANEOUS
  Filled 2014-04-09 (×10): qty 0.4

## 2014-04-09 MED ORDER — POLYETHYLENE GLYCOL 3350 17 G PO PACK: 17.0000 g | PACK | Freq: Every day | ORAL | Status: DC | PRN

## 2014-04-09 MED ORDER — OXYCODONE-ACETAMINOPHEN 10-325 MG PO TABS: 1.0000 | ORAL_TABLET | ORAL | Status: DC | PRN

## 2014-04-09 MED ORDER — ALPRAZOLAM 1 MG PO TABS: 2.0000 mg | ORAL_TABLET | Freq: Two times a day (BID) | ORAL | Status: DC | PRN

## 2014-04-09 MED ORDER — HYDROMORPHONE HCL 2 MG/ML IJ SOLN
2.0000 mg | INTRAMUSCULAR | Status: DC | PRN
  Administered 2014-04-09 – 2014-04-10 (×6): 2 mg via INTRAVENOUS
  Filled 2014-04-09 (×7): qty 1

## 2014-04-09 MED ORDER — METHADONE HCL 5 MG PO TABS
5.0000 mg | ORAL_TABLET | Freq: Every day | ORAL | Status: DC
  Administered 2014-04-09 – 2014-04-17 (×9): 5 mg via ORAL
  Filled 2014-04-09 (×9): qty 1

## 2014-04-09 MED ORDER — OXYCODONE-ACETAMINOPHEN 5-325 MG PO TABS: 1.0000 | ORAL_TABLET | ORAL | Status: DC | PRN

## 2014-04-09 MED ORDER — DIPHENHYDRAMINE HCL 50 MG/ML IJ SOLN: 12.5000 mg | Freq: Four times a day (QID) | INTRAMUSCULAR | Status: DC | PRN

## 2014-04-09 MED ORDER — OXYCODONE HCL 5 MG PO TABS: 15.0000 mg | ORAL_TABLET | Freq: Four times a day (QID) | ORAL | Status: DC | PRN

## 2014-04-09 MED ORDER — HYDROMORPHONE 2 MG/ML HIGH CONCENTRATION IV PCA SOLN
INTRAVENOUS | Status: DC
  Administered 2014-04-09: 11:00:00 via INTRAVENOUS
  Administered 2014-04-09: 9.2 mg via INTRAVENOUS
  Filled 2014-04-09: qty 25

## 2014-04-09 MED ORDER — HYDROMORPHONE HCL 2 MG/ML IJ SOLN
2.0000 mg | Freq: Once | INTRAMUSCULAR | Status: AC
Start: 2014-04-09 — End: 2014-04-09
  Administered 2014-04-09: 2 mg via INTRAVENOUS
  Filled 2014-04-09: qty 1

## 2014-04-09 MED ORDER — HYDROMORPHONE HCL 1 MG/ML IJ SOLN
1.0000 mg | INTRAMUSCULAR | Status: DC | PRN
  Administered 2014-04-09: 1 mg via INTRAVENOUS
  Filled 2014-04-09: qty 1

## 2014-04-09 MED ORDER — HYDROMORPHONE 2 MG/ML HIGH CONCENTRATION IV PCA SOLN
INTRAVENOUS | Status: DC
  Administered 2014-04-09: 6.57 mg via INTRAVENOUS
  Administered 2014-04-10: 4.36 mg via INTRAVENOUS
  Administered 2014-04-10: 2.38 mg via INTRAVENOUS
  Administered 2014-04-10: 2.4 mg via INTRAVENOUS
  Administered 2014-04-10: 4.07 mg via INTRAVENOUS
  Administered 2014-04-10: 1.46 mg via INTRAVENOUS
  Administered 2014-04-11: 2 mg via INTRAVENOUS
  Administered 2014-04-11: 1.73 mg via INTRAVENOUS
  Administered 2014-04-11: 4.8 mg via INTRAVENOUS
  Administered 2014-04-11: 9.94 mg via INTRAVENOUS
  Administered 2014-04-11: 2.8 mg via INTRAVENOUS
  Administered 2014-04-11: 9.32 mg via INTRAVENOUS
  Administered 2014-04-11: 8 mg via INTRAVENOUS
  Administered 2014-04-12: 4.2 mg via INTRAVENOUS
  Administered 2014-04-12 (×2): 0.7 mg via INTRAVENOUS
  Administered 2014-04-12: 4.2 mg via INTRAVENOUS
  Filled 2014-04-09: qty 25

## 2014-04-09 MED ORDER — HYDROXYUREA 500 MG PO CAPS
1500.0000 mg | ORAL_CAPSULE | Freq: Every day | ORAL | Status: DC
  Administered 2014-04-09 – 2014-04-18 (×10): 1500 mg via ORAL
  Filled 2014-04-09 (×10): qty 3

## 2014-04-09 MED ORDER — NALOXONE HCL 0.4 MG/ML IJ SOLN: 0.4000 mg | INTRAMUSCULAR | Status: DC | PRN

## 2014-04-09 MED ORDER — OXYCODONE HCL 5 MG PO TABS: 5.0000 mg | ORAL_TABLET | ORAL | Status: DC | PRN

## 2014-04-09 MED ORDER — KETOROLAC TROMETHAMINE 30 MG/ML IJ SOLN
30.0000 mg | Freq: Four times a day (QID) | INTRAMUSCULAR | Status: AC
  Administered 2014-04-09 – 2014-04-14 (×18): 30 mg via INTRAVENOUS
  Filled 2014-04-09 (×24): qty 1

## 2014-04-09 MED ORDER — ONDANSETRON HCL 4 MG/2ML IJ SOLN
4.0000 mg | Freq: Once | INTRAMUSCULAR | Status: AC
  Administered 2014-04-09: 4 mg via INTRAVENOUS
  Filled 2014-04-09: qty 2

## 2014-04-09 MED ORDER — SODIUM CHLORIDE 0.9 % IV BOLUS (SEPSIS)
1000.0000 mL | Freq: Once | INTRAVENOUS | Status: AC
  Administered 2014-04-09: 1000 mL via INTRAVENOUS

## 2014-04-09 MED ORDER — HYDROMORPHONE 0.3 MG/ML IV SOLN
INTRAVENOUS | Status: DC
  Administered 2014-04-09: 10:00:00 via INTRAVENOUS
  Filled 2014-04-09: qty 25

## 2014-04-09 MED ORDER — ONDANSETRON HCL 4 MG/2ML IJ SOLN
4.0000 mg | Freq: Four times a day (QID) | INTRAMUSCULAR | Status: DC | PRN
  Administered 2014-04-13 – 2014-04-18 (×6): 4 mg via INTRAVENOUS
  Filled 2014-04-09 (×6): qty 2

## 2014-04-09 MED ORDER — OXYCODONE HCL 10 MG PO TABS: 15.0000 mg | ORAL_TABLET | Freq: Four times a day (QID) | ORAL | Status: DC | PRN

## 2014-04-09 MED ORDER — ONDANSETRON HCL 4 MG/2ML IJ SOLN: 4.0000 mg | Freq: Four times a day (QID) | INTRAMUSCULAR | Status: DC | PRN

## 2014-04-09 MED ORDER — SULINDAC 150 MG PO TABS: 150.0000 mg | ORAL_TABLET | Freq: Two times a day (BID) | ORAL | Status: DC | PRN

## 2014-04-09 MED ORDER — SODIUM CHLORIDE 0.9 % IV SOLN
INTRAVENOUS | Status: DC
  Administered 2014-04-09: 08:00:00 via INTRAVENOUS

## 2014-04-09 NOTE — ED Notes (Signed)
Patient reports pain in left arm and right lower back woke her from sleep.  Upon assessment, patient reports that she is lonely and spends a lot of time alone.  She is not currently grimacing or guarding.

## 2014-04-09 NOTE — ED Notes (Signed)
Bed: GB15 Expected date: 04/09/14 Expected time: 2:41 AM Means of arrival: Ambulance Comments: Sickle cell crisis

## 2014-04-09 NOTE — H&P (Signed)
[facility] History and Physical  [name] UQJ:[id number] DOB: [birth year] DOA: 04/09/2014  Referring physician: ED physician PCP: MATTHEWS,MICHELLE A., MD   Chief Complaint: generalized pain   HPI:  23 year old female with past medical history of sickle cell Hb-SS disease, previous recent admission in early October for sickle cell pain crisis who presented to [facility] ED 04/09/2014 with worsening pain in the lower back and upper extremities more pronounced in left arm. Pain is 10/10 in intensity and not relieved with home analgesics, methadone and oxycodone. Patient ha no complaints of chest pain, shortness of breath or palpitations. No reports of fevers or chills. NO reports of urinary frequency, urgency or dysuria. No diarrhea or constipation.  In ED, vitals are stable: BP 127/74, HR 87-109, RR 15, T max 98.9 F and oxygen saturation 99% on room air. Blood work showed hemoglobin of 10.2, platelets of 467, sodium 136, normal renal function. Patient has received total of 5 mg IV dilaudid and pain still not adequately controlled. [facility] asked to admit for pain management.   Assessment and Plan:  Active Problems: Acute sickle cell pain crisis - we will resume home medications which include methadone 5 mg at bedtime.  - added dilaudid PCA for better pain control - resume folic acid and hydroxyurea - continue 1/2 NS at 100 cc/hr - antiemetics as needed  Sickle cell anemia - hemoglobin is 10.2 on admission.  - continue folic acid  DVT prophylaxis: Lovenox subQ ordered   Code Status: Full Family Communication: Pt at bedside Disposition Plan: Admit for further evaluation     Review of Systems:  Constitutional: Negative for fever, chills and malaise/fatigue. Negative for diaphoresis.  HENT: Negative for hearing loss, ear pain, nosebleeds, congestion, sore throat, neck pain, tinnitus and ear discharge.   Eyes: Negative for blurred vision, double vision, photophobia, pain, discharge  and redness.  Respiratory: Negative for cough, hemoptysis, sputum production, shortness of breath, wheezing and stridor.   Cardiovascular: Negative for chest pain, palpitations, orthopnea, claudication and leg swelling.  Gastrointestinal: Negative for nausea, vomiting and abdominal pain. Negative for heartburn, constipation, blood in stool and melena.  Genitourinary: Negative for dysuria, urgency, frequency, hematuria and flank pain.  Musculoskeletal: per HPI Skin: Negative for itching and rash.  Neurological: Negative for dizziness. Negative for tingling, tremors, sensory change, speech change, focal weakness, loss of consciousness and headaches.  Endo/Heme/Allergies: Negative for environmental allergies and polydipsia. Does not bruise/bleed easily.  Psychiatric/Behavioral: Negative for suicidal ideas. The patient is not nervous/anxious.      Past Medical History  Diagnosis Date  . H/O: 1 miscarriage 03/22/2011    Pt reports 2 miscarriages.  [name] 01/08/2009  . Depression 01/06/2011  . GERD (gastroesophageal reflux disease) 02/17/2011  . Trichotillomania     h/o  . Blood transfusion     "lots"  . Sickle cell anemia with crisis   . Exertional dyspnea     "sometimes"  . Sickle cell anemia   . Headache(784.0)   . Migraines 11/08/11    "@ least twice/month"  . Chronic back pain     "very severe; have knot in my back; from tight muscle; take RX and exercise for it"  . Mood swings 11/08/11    "I go back and forth; real bad"  . Pregnancy   . Blood dyscrasia     SICKLE CELL    Past Surgical History  Procedure Laterality Date  . Cholecystectomy  05/2010  . Dilation and curettage of uterus  02/20/11    S/P miscarriage    Social History:  reports that she quit smoking about 12 months ago. Her smoking use included Cigarettes. She has a .25 pack-year smoking history. She has never used smokeless tobacco. She reports that she drinks alcohol. She reports that she does not use  illicit drugs.  Allergies  Allergen Reactions  . Carrot Oil Hives and Swelling  . Latex Rash    Family History  Problem Relation Age of Onset  . Diabetes Mother   . Alcoholism    . Depression    . Hypercholesterolemia    . Hypertension    . Migraines    . Diabetes Maternal Grandmother   . Diabetes Paternal Grandmother     Prior to Admission medications   Medication Sig Start Date End Date Taking? Authorizing Provider  alprazolam Duanne Moron) 2 MG tablet Take 1 tablet by mouth 2 (two) times daily as needed. Anxiety 04/01/14  Yes Historical Provider, MD  hydroxyurea (HYDREA) 500 MG capsule Take 3 capsules (1,500 mg total) by mouth daily. May take with food to minimize GI side effects. 03/25/14  Yes [name], MD  methadone (DOLOPHINE) 5 MG tablet Take 1 tablet (5 mg total) by mouth at bedtime. 03/11/14  Yes [name], MD  Oxycodone HCl 10 MG TABS Take 1.5 tablets (15 mg total) by mouth every 6 (six) hours as needed (moderate pain). 03/25/14  Yes [name], MD  oxyCODONE-acetaminophen (PERCOCET) 10-325 MG per tablet Take 1 tablet by mouth every 4 (four) hours as needed. pain 03/20/14  Yes Historical Provider, MD  sulindac (CLINORIL) 150 MG tablet Take 1 tablet (150 mg total) by mouth 2 (two) times daily as needed (Pain in right thigh). Patient not taking: Reported on 04/09/2014 02/25/14   [name], MD    Physical Exam: Filed Vitals:   04/09/14 0530 04/09/14 0531 04/09/14 0750 04/09/14 1001  BP: 127/77 127/77 105/63   Pulse: 109 105 96   Temp:   98.3 F (36.8 C)   TempSrc:   Oral   Resp:  15 15 11   Height:   5\' 3"  (1.6 m)   Weight:   74.3 kg (163 lb 12.8 oz)   SpO2: 99% 100% 95% 99%    Physical Exam  Constitutional: Appears well-developed and well-nourished. No distress.  HENT: Normocephalic. External right and left ear normal. Oropharynx is clear and moist.  Eyes: Conjunctivae and EOM are normal. PERRLA, no scleral icterus.  Neck:  Normal ROM. Neck supple. No JVD. No tracheal deviation. No thyromegaly.  CVS: RRR, S1/S2 +, no murmurs, no gallops, no carotid bruit.  Pulmonary: Effort and breath sounds normal, no stridor, rhonchi, wheezes, rales.  Abdominal: Soft. BS +,  no distension, tenderness, rebound or guarding.  Musculoskeletal: Normal range of motion. No edema and no tenderness.  Lymphadenopathy: No lymphadenopathy noted, cervical, inguinal. Neuro: Alert. Normal reflexes, muscle tone coordination. No cranial nerve deficit. Skin: Skin is warm and dry. No rash noted. Not diaphoretic. No erythema. No pallor.  Psychiatric: Normal mood and affect. Behavior, judgment, thought content normal.   Labs on Admission:  Basic Metabolic Panel:  Recent Labs Lab 04/09/14 0417  NA 136*  K 3.9  CL 100  CO2 20  GLUCOSE 103*  BUN 4*  CREATININE 0.64  CALCIUM 9.4   Liver Function Tests:  Recent Labs Lab 04/09/14 0417  AST 21  ALT 16  ALKPHOS 63  BILITOT 1.4*  PROT 8.1  ALBUMIN 4.3  No results for input(s): LIPASE, AMYLASE in the last 168 hours. No results for input(s): AMMONIA in the last 168 hours. CBC:  Recent Labs Lab 04/09/14 0417  WBC 8.0  NEUTROABS 5.6  HGB 10.2*  HCT 29.2*  MCV 93.0  PLT 467*   Cardiac Enzymes: No results for input(s): CKTOTAL, CKMB, CKMBINDEX, TROPONINI in the last 168 hours. BNP: Invalid input(s): POCBNP CBG: No results for input(s): GLUCAP in the last 168 hours.  Faye Ramsay, MD  [facility] Pager [phone]  If 7PM-7AM, please contact night-coverage [email or web address] Password [facility] 04/09/2014, 10:23 AM

## 2014-04-09 NOTE — ED Notes (Signed)
Tatyana, PA at bedside. 

## 2014-04-09 NOTE — ED Provider Notes (Signed)
CSN: [id number]     Arrival date & time 04/09/14  0256 History   First MD Initiated Contact with Patient 04/09/14 0310     Chief Complaint  Patient presents with  . Sickle Cell Pain Crisis     (Consider location/radiation/quality/duration/timing/severity/associated sxs/prior Treatment) HPI [name] is a 23 y.o. female who presents to emergency department complaining of pain in her left arm bilateral flank. States she was not feeling well yesterday, but pain worsened tonight and awoke her from going to sleep. She states she's been taking oxycodone with no relief of her symptoms. She denies any fever or chills. Denies any chest pain or cough. No shortness of breath. No abdominal pain. Denies nausea, vomiting, diarrhea.  Past Medical History  Diagnosis Date  . H/O: 1 miscarriage 03/22/2011    Pt reports 2 miscarriages.  [name] 01/08/2009  . Depression 01/06/2011  . GERD (gastroesophageal reflux disease) 02/17/2011  . Trichotillomania     h/o  . Blood transfusion     "lots"  . Sickle cell anemia with crisis   . Exertional dyspnea     "sometimes"  . Sickle cell anemia   . Headache(784.0)   . Migraines 11/08/11    "@ least twice/month"  . Chronic back pain     "very severe; have knot in my back; from tight muscle; take RX and exercise for it"  . Mood swings 11/08/11    "I go back and forth; real bad"  . Pregnancy   . Blood dyscrasia     SICKLE CELL   Past Surgical History  Procedure Laterality Date  . Cholecystectomy  05/2010  . Dilation and curettage of uterus  02/20/11    S/P miscarriage   Family History  Problem Relation Age of Onset  . Diabetes Mother   . Alcoholism    . Depression    . Hypercholesterolemia    . Hypertension    . Migraines    . Diabetes Maternal Grandmother   . Diabetes Paternal Grandmother    History  Substance Use Topics  . Smoking status: Former Smoker -- 0.25 packs/day for 1 years    Types: Cigarettes    Quit date:  03/25/2013  . Smokeless tobacco: Never Used  . Alcohol Use: Yes     Comment: pt states she quit marijuan in May 2013. Rare ETOH, + cigarettes.  She is enrolled in school   OB History    Gravida Para Term Preterm AB TAB SAB Ectopic Multiple Living   2    1  1          Obstetric Comments   Miscarried in October 2012 at about 7 weeks     Review of Systems  Constitutional: Negative for fever and chills.  Respiratory: Negative for cough, chest tightness and shortness of breath.   Cardiovascular: Negative for chest pain, palpitations and leg swelling.  Gastrointestinal: Negative for nausea, vomiting, abdominal pain and diarrhea.  Genitourinary: Negative for dysuria, flank pain, vaginal bleeding, vaginal discharge, vaginal pain and pelvic pain.  Musculoskeletal: Positive for myalgias, back pain and arthralgias. Negative for neck pain and neck stiffness.  Skin: Negative for rash.  Neurological: Negative for dizziness, weakness and headaches.  All other systems reviewed and are negative.     Allergies  Carrot oil and Latex  Home Medications   Prior to Admission medications   Medication Sig Start Date End Date Taking? Authorizing Provider  hydroxyurea (HYDREA) 500 MG capsule Take 3 capsules (1,500 mg total) by  mouth daily. May take with food to minimize GI side effects. 03/25/14   [name], MD  methadone (DOLOPHINE) 5 MG tablet Take 1 tablet (5 mg total) by mouth at bedtime. 03/11/14   [name], MD  Oxycodone HCl 10 MG TABS Take 1.5 tablets (15 mg total) by mouth every 6 (six) hours as needed (moderate pain). 03/25/14   [name], MD  sulindac (CLINORIL) 150 MG tablet Take 1 tablet (150 mg total) by mouth 2 (two) times daily as needed (Pain in right thigh). 02/25/14   [name], MD   BP 127/74 mmHg  Pulse 87  Temp(Src) 98.9 F (37.2 C) (Oral)  SpO2 99%  LMP 03/20/2014 Physical Exam  Constitutional: She is oriented to person, place, and  time. She appears well-developed and well-nourished. No distress.  HENT:  Head: Normocephalic.  Eyes: Conjunctivae are normal.  Neck: Neck supple.  Cardiovascular: Normal rate, regular rhythm and normal heart sounds.   Pulmonary/Chest: Effort normal and breath sounds normal. No respiratory distress. She has no wheezes. She has no rales.  Abdominal: Soft. Bowel sounds are normal. She exhibits no distension. There is no tenderness. There is no rebound.  Musculoskeletal: She exhibits no edema.  Neurological: She is alert and oriented to person, place, and time.  Skin: Skin is warm and dry.  Psychiatric: She has a normal mood and affect. Her behavior is normal.  Nursing note and vitals reviewed.   ED Course  Procedures (including critical care time) Labs Review Labs Reviewed  CBC WITH DIFFERENTIAL - Abnormal; Notable for the following:    RBC 3.14 (*)    Hemoglobin 10.2 (*)    HCT 29.2 (*)    RDW 17.3 (*)    Platelets 467 (*)    All other components within normal limits  COMPREHENSIVE METABOLIC PANEL - Abnormal; Notable for the following:    Sodium 136 (*)    Glucose, Bld 103 (*)    BUN 4 (*)    Total Bilirubin 1.4 (*)    Anion gap 16 (*)    All other components within normal limits  RETICULOCYTES - Abnormal; Notable for the following:    Retic Ct Pct 13.6 (*)    RBC. 3.14 (*)    Retic Count, Manual 427.0 (*)    All other components within normal limits  URINALYSIS, ROUTINE W REFLEX MICROSCOPIC  POC URINE PREG, ED    Imaging Review No results found.   EKG Interpretation None      MDM   Final diagnoses:  Sickle cell anemia with crisis    Patient is here with back pain and left arm pain. Onset of symptoms yesterday. States this is typical of her sickle cell crisis. Will start IV fluids, pain medications ordered. Will check some blood tests. Urinalysis ordered as well. No concern for acute chest syndrome given no chest pain, no hypoxica, no SOB.   5:59 AM Labs all  at baseline. Vital signs are normal. Patient received 3 doses of pain medications, states pain did improve, however she is uncomfortable going home. Patient stated that "I don't want to stay but I'm afraid if I leave now I will come back today." No concern for acute chest syndrome. Spoke with [facility], will admit  Filed Vitals:   04/09/14 0310 04/09/14 0531  BP: 127/74 127/77  Pulse: 87 105  Temp: 98.9 F (37.2 C)   TempSrc: Oral   Resp:  15  SpO2: 99% 100%  Renold Genta, PA-C 04/09/14 0600  Kalman Drape, MD 04/13/14 [phone]

## 2014-04-09 NOTE — Plan of Care (Signed)
Problem: Consults Goal: Social Work Consult if indicated Outcome: Not Applicable Date Met:  40/37/54 Pt refused Goal: Skin Care Protocol Initiated - if Braden Score 18 or less If consults are not indicated, leave blank or document N/A Outcome: Not Applicable Date Met:  36/06/77 Goal: Diabetes Guidelines if Diabetic/Glucose > 140 If diabetic or lab glucose is > 140 mg/dl - Initiate Diabetes/Hyperglycemia Guidelines & Document Interventions  Outcome: Not Applicable Date Met:  03/40/35  Problem: Phase I Progression Outcomes Goal: Pulmonary Hygiene as Indicated (Sickle Cell) Outcome: Progressing Encourage to use the incentive spirometer. Left at bedside. Pt voiced understanding in use.

## 2014-04-09 NOTE — Evaluation (Signed)
Received call about pt w/ sickle cell crisis. Pain x 2 days. Pain mainly in L arm and bilateral flank area. Has been taking oxycodone with minimal improvement in sxs. No CP per EDPA. Hemodynamically stable. No hypoxia. Hgb 10.2. retic ct 13.6. Has received 5mg  IV dilaudid over past 2 hours and 1L NS bolus with minimal improvement in pain. Pt desires admission. Admit to medsurg bed.

## 2014-04-09 NOTE — Progress Notes (Signed)
Patient ID: [name], female   DOB: 19-May-1990, 23 y.o.   MRN: [id number] Pt admitted this morning by Dr. Doyle Askew. She is opiate tolerant and requires higher doses of Opiate analgesia than she is currently on. Presently she rates her pain as 10/10 and localized to her back, legs and left elbow. She has no associated symptoms.   I will adjust her PCA to a weight based dosing and increase PCA bolus to 0.8 mg. I will also add Toradol 30 mg IV q 6 hours and offer clinician assisted doses as needed. I will also assess patient in the evening for need for basal dose overnight.

## 2014-04-09 NOTE — ED Notes (Signed)
Patient reports "pain all over."

## 2014-04-09 NOTE — ED Notes (Signed)
Report to Romie Minus, RN on [place].

## 2014-04-10 ENCOUNTER — Inpatient Hospital Stay (HOSPITAL_COMMUNITY): Payer: Medicare Other

## 2014-04-10 LAB — CBC WITH DIFFERENTIAL/PLATELET
BASOS PCT: 0 % (ref 0–1)
Basophils Absolute: 0 10*3/uL (ref 0.0–0.1)
EOS ABS: 0.3 10*3/uL (ref 0.0–0.7)
Eosinophils Relative: 2 % (ref 0–5)
HEMATOCRIT: 27.8 % — AB (ref 36.0–46.0)
HEMOGLOBIN: 9.6 g/dL — AB (ref 12.0–15.0)
Lymphocytes Relative: 29 % (ref 12–46)
Lymphs Abs: 3.7 10*3/uL (ref 0.7–4.0)
MCH: 32.7 pg (ref 26.0–34.0)
MCHC: 34.5 g/dL (ref 30.0–36.0)
MCV: 94.6 fL (ref 78.0–100.0)
MONO ABS: 1.6 10*3/uL — AB (ref 0.1–1.0)
MONOS PCT: 12 % (ref 3–12)
Neutro Abs: 7.4 10*3/uL (ref 1.7–7.7)
Neutrophils Relative %: 57 % (ref 43–77)
Platelets: 442 10*3/uL — ABNORMAL HIGH (ref 150–400)
RBC: 2.94 MIL/uL — ABNORMAL LOW (ref 3.87–5.11)
RDW: 18.1 % — AB (ref 11.5–15.5)
WBC: 13 10*3/uL — ABNORMAL HIGH (ref 4.0–10.5)

## 2014-04-10 LAB — BASIC METABOLIC PANEL
Anion gap: 11 (ref 5–15)
BUN: 8 mg/dL (ref 6–23)
CO2: 24 mEq/L (ref 19–32)
CREATININE: 0.71 mg/dL (ref 0.50–1.10)
Calcium: 9.4 mg/dL (ref 8.4–10.5)
Chloride: 101 mEq/L (ref 96–112)
Glucose, Bld: 99 mg/dL (ref 70–99)
Potassium: 4 mEq/L (ref 3.7–5.3)
Sodium: 136 mEq/L — ABNORMAL LOW (ref 137–147)

## 2014-04-10 LAB — LACTATE DEHYDROGENASE: LDH: 321 U/L — ABNORMAL HIGH (ref 94–250)

## 2014-04-10 MED ORDER — LIDOCAINE HCL 1 % IJ SOLN
INTRAMUSCULAR | Status: AC
  Filled 2014-04-10: qty 20

## 2014-04-10 MED ORDER — HYDROMORPHONE HCL 2 MG/ML IJ SOLN
2.0000 mg | INTRAMUSCULAR | Status: DC | PRN
  Administered 2014-04-10: 2 mg via SUBCUTANEOUS

## 2014-04-10 MED ORDER — HYDROMORPHONE HCL 2 MG/ML IJ SOLN
2.0000 mg | INTRAMUSCULAR | Status: DC | PRN
  Administered 2014-04-10 – 2014-04-13 (×19): 2 mg via INTRAVENOUS
  Filled 2014-04-10 (×19): qty 1

## 2014-04-10 NOTE — Procedures (Signed)
Successful RUE DL POWER PICC TIP SVC/RA NO COMP STABLE READY FOR USE  

## 2014-04-10 NOTE — Progress Notes (Signed)
Subjective: A 23 Yo admitted with sickle cell painful crisis. Has been on Dilaudid PCA with basal dose from 6pm to 8am today as well as Physician assisted dosing of 2 mg Q2 hours. Still complaining of 8/10 pain in her back and legs this morning. No SOB, no cough, No NVD. Patient has used 23.35 mg of Dilaudid since admission. Denies any fever. Has had issues with medication compliance as out patient.  Objective: Vital signs in last 24 hours: Temp:  [98.2 F (36.8 C)-99.1 F (37.3 C)] 98.7 F (37.1 C) (11/26 1043) Pulse Rate:  [104-136] 110 (11/26 1043) Resp:  [10-19] 16 (11/26 1043) BP: (99-127)/(68-87) 104/72 mmHg (11/26 1043) SpO2:  [92 %-100 %] 96 % (11/26 1043) Weight:  [74.481 kg (164 lb 3.2 oz)] 74.481 kg (164 lb 3.2 oz) (11/26 0555) Weight change:     Intake/Output from previous day: 11/25 0701 - 11/26 0700 In: 2992 [P.O.:1140; I.V.:1852] Out: -  Intake/Output this shift:    General appearance: alert, cooperative and mild distress Eyes: conjunctivae/corneas clear. PERRL, EOM's intact. Fundi benign. Throat: lips, mucosa, and tongue normal; teeth and gums normal Neck: no adenopathy, no carotid bruit, no JVD, supple, symmetrical, trachea midline and thyroid not enlarged, symmetric, no tenderness/mass/nodules Back: symmetric, no curvature. ROM normal. No CVA tenderness. Resp: clear to auscultation bilaterally Chest wall: no tenderness Cardio: regular rate and rhythm, S1, S2 normal, no murmur, click, rub or gallop GI: soft, non-tender; bowel sounds normal; no masses,  no organomegaly Extremities: extremities normal, atraumatic, no cyanosis or edema Pulses: 2+ and symmetric Skin: Skin color, texture, turgor normal. No rashes or lesions Neurologic: Grossly normal  Lab Results:  Recent Labs  04/09/14 0417 04/10/14 0500  WBC 8.0 13.0*  HGB 10.2* 9.6*  HCT 29.2* 27.8*  PLT 467* 442*   BMET  Recent Labs  04/09/14 0417 04/10/14 0500  NA 136* 136*  K 3.9 4.0  CL 100  101  CO2 20 24  GLUCOSE 103* 99  BUN 4* 8  CREATININE 0.64 0.71  CALCIUM 9.4 9.4    Studies/Results: No results found.  Medications: I have reviewed the patient's current medications.  Assessment/Plan: A 23 yo woman admitted with Sickle cell Painful crisis.  #1 Sickle Cell Painful Crisis: patient on Dilaudid PCA with Toradol and IVF. Will stop the basal dose but continue with Physician assisted dosisng and her Methadone. Will re-start other oral medications by tomorrow if responding better.  #2 Sickle Cell anemia: H&H is slightly reduced but no evidence of hemolysis and it seems due to dilutional reasons. Continue current treatment.  #3 Leucocytosis: due to VOC. 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   LOS: 1 day   Augusta Mirkin,LAWAL 04/10/2014, 10:51 AM

## 2014-04-10 NOTE — Plan of Care (Signed)
Problem: Phase I Progression Outcomes Goal: Pt. aware of pain management plan Outcome: Completed/Met Date Met:  04/10/14  Problem: Phase II Progression Outcomes Goal: Tolerating diet Outcome: Completed/Met Date Met:  04/10/14

## 2014-04-11 LAB — CBC WITH DIFFERENTIAL/PLATELET
BASOS ABS: 0 10*3/uL (ref 0.0–0.1)
Basophils Relative: 0 % (ref 0–1)
EOS PCT: 3 % (ref 0–5)
Eosinophils Absolute: 0.4 10*3/uL (ref 0.0–0.7)
HCT: 24.2 % — ABNORMAL LOW (ref 36.0–46.0)
Hemoglobin: 8.3 g/dL — ABNORMAL LOW (ref 12.0–15.0)
LYMPHS ABS: 3.3 10*3/uL (ref 0.7–4.0)
Lymphocytes Relative: 25 % (ref 12–46)
MCH: 32.4 pg (ref 26.0–34.0)
MCHC: 34.3 g/dL (ref 30.0–36.0)
MCV: 94.5 fL (ref 78.0–100.0)
Monocytes Absolute: 1.1 10*3/uL — ABNORMAL HIGH (ref 0.1–1.0)
Monocytes Relative: 8 % (ref 3–12)
Neutro Abs: 8.6 10*3/uL — ABNORMAL HIGH (ref 1.7–7.7)
Neutrophils Relative %: 64 % (ref 43–77)
PLATELETS: 370 10*3/uL (ref 150–400)
RBC: 2.56 MIL/uL — ABNORMAL LOW (ref 3.87–5.11)
RDW: 18.5 % — AB (ref 11.5–15.5)
WBC: 13.3 10*3/uL — ABNORMAL HIGH (ref 4.0–10.5)

## 2014-04-11 LAB — COMPREHENSIVE METABOLIC PANEL
ALT: 12 U/L (ref 0–35)
AST: 15 U/L (ref 0–37)
Albumin: 3.5 g/dL (ref 3.5–5.2)
Alkaline Phosphatase: 61 U/L (ref 39–117)
Anion gap: 10 (ref 5–15)
BUN: 7 mg/dL (ref 6–23)
CALCIUM: 8.7 mg/dL (ref 8.4–10.5)
CO2: 26 mEq/L (ref 19–32)
Chloride: 102 mEq/L (ref 96–112)
Creatinine, Ser: 0.69 mg/dL (ref 0.50–1.10)
GFR calc non Af Amer: >90 mL/min (ref >90)
GLUCOSE: 93 mg/dL (ref 70–99)
Potassium: 3.9 mEq/L (ref 3.7–5.3)
SODIUM: 138 meq/L (ref 137–147)
TOTAL PROTEIN: 6.4 g/dL (ref 6.0–8.3)
Total Bilirubin: 2.8 mg/dL — ABNORMAL HIGH (ref 0.3–1.2)

## 2014-04-11 LAB — GLUCOSE, CAPILLARY: Glucose-Capillary: 98 mg/dL (ref 70–99)

## 2014-04-11 NOTE — Progress Notes (Signed)
Subjective: A Patient still having a 7/10 pain. Has been on Dilaudid PCA as well as Physician assisted dosing. The basal dose has been discontinued today. She has been sleeping for the most part and going to the bathroom without any problem.  Objective: Vital signs in last 24 hours: Temp:  [98.1 F (36.7 C)-98.2 F (36.8 C)] 98.2 F (36.8 C) (11/27 1303) Pulse Rate:  [98-107] 98 (11/27 1303) Resp:  [11-15] 14 (11/27 1303) BP: (104-112)/(59-73) 112/73 mmHg (11/27 1303) SpO2:  [91 %-100 %] 94 % (11/27 1303) Weight:  [74.571 kg (164 lb 6.4 oz)] 74.571 kg (164 lb 6.4 oz) (11/27 0517) Weight change: 0.271 kg (9.6 oz) Last BM Date:  (pta)  Intake/Output from previous day: 11/26 0701 - 11/27 0700 In: 4040.5 [P.O.:925; I.V.:3115.5] Out: 1000 [Urine:1000] Intake/Output this shift:    General appearance: alert, cooperative and mild distress Eyes: conjunctivae/corneas clear. PERRL, EOM's intact. Fundi benign. Throat: lips, mucosa, and tongue normal; teeth and gums normal Neck: no adenopathy, no carotid bruit, no JVD, supple, symmetrical, trachea midline and thyroid not enlarged, symmetric, no tenderness/mass/nodules Back: symmetric, no curvature. ROM normal. No CVA tenderness. Resp: clear to auscultation bilaterally Chest wall: no tenderness Cardio: regular rate and rhythm, S1, S2 normal, no murmur, click, rub or gallop GI: soft, non-tender; bowel sounds normal; no masses,  no organomegaly Extremities: extremities normal, atraumatic, no cyanosis or edema Pulses: 2+ and symmetric Skin: Skin color, texture, turgor normal. No rashes or lesions Neurologic: Grossly normal  Lab Results:  Recent Labs  04/10/14 0500 04/11/14 0533  WBC 13.0* 13.3*  HGB 9.6* 8.3*  HCT 27.8* 24.2*  PLT 442* 370   BMET  Recent Labs  04/10/14 0500 04/11/14 0533  NA 136* 138  K 4.0 3.9  CL 101 102  CO2 24 26  GLUCOSE 99 93  BUN 8 7  CREATININE 0.71 0.69  CALCIUM 9.4 8.7    Studies/Results: No  results found.  Medications: I have reviewed the patient's current medications.  Assessment/Plan: A 23 yo woman admitted with Sickle cell Painful crisis.  #1 Sickle Cell Painful Crisis: patient on Dilaudid PCA with Toradol and IVF. Will continue with Physician assisted dosing and her Methadone.   #2 Sickle Cell anemia: H&H is reduced.Will monitor for hemolysis and Continue current treatment.  #3 Leucocytosis: due to VOC. 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   LOS: 2 days   GARBA,LAWAL 04/11/2014, 3:58 PM

## 2014-04-11 NOTE — Progress Notes (Signed)
Second time today that pt's picc line dressing had to be replaced because it was removed by patient. Stated that she did not, and that it got wet and came off. Pt reminded that she could get an infection at that site without a dressing if she keeps touching it. Will cont to monitorl

## 2014-04-12 LAB — COMPREHENSIVE METABOLIC PANEL
ALK PHOS: 72 U/L (ref 39–117)
ALT: 11 U/L (ref 0–35)
ANION GAP: 10 (ref 5–15)
AST: 15 U/L (ref 0–37)
Albumin: 3.5 g/dL (ref 3.5–5.2)
BUN: 8 mg/dL (ref 6–23)
CHLORIDE: 101 meq/L (ref 96–112)
CO2: 25 mEq/L (ref 19–32)
Calcium: 8.6 mg/dL (ref 8.4–10.5)
Creatinine, Ser: 0.7 mg/dL (ref 0.50–1.10)
GFR calc non Af Amer: >90 mL/min (ref >90)
GLUCOSE: 107 mg/dL — AB (ref 70–99)
POTASSIUM: 3.8 meq/L (ref 3.7–5.3)
Sodium: 136 mEq/L — ABNORMAL LOW (ref 137–147)
Total Bilirubin: 2.3 mg/dL — ABNORMAL HIGH (ref 0.3–1.2)
Total Protein: 6.7 g/dL (ref 6.0–8.3)

## 2014-04-12 LAB — CBC WITH DIFFERENTIAL/PLATELET
Basophils Absolute: 0 10*3/uL (ref 0.0–0.1)
Basophils Relative: 0 % (ref 0–1)
Eosinophils Absolute: 0.4 10*3/uL (ref 0.0–0.7)
Eosinophils Relative: 2 % (ref 0–5)
HCT: 22.6 % — ABNORMAL LOW (ref 36.0–46.0)
HEMOGLOBIN: 7.9 g/dL — AB (ref 12.0–15.0)
LYMPHS ABS: 2 10*3/uL (ref 0.7–4.0)
Lymphocytes Relative: 13 % (ref 12–46)
MCH: 32.9 pg (ref 26.0–34.0)
MCHC: 35 g/dL (ref 30.0–36.0)
MCV: 94.2 fL (ref 78.0–100.0)
MONOS PCT: 7 % (ref 3–12)
Monocytes Absolute: 1.1 10*3/uL — ABNORMAL HIGH (ref 0.1–1.0)
NEUTROS ABS: 12.3 10*3/uL — AB (ref 1.7–7.7)
NEUTROS PCT: 78 % — AB (ref 43–77)
Platelets: 386 10*3/uL (ref 150–400)
RBC: 2.4 MIL/uL — ABNORMAL LOW (ref 3.87–5.11)
RDW: 18.3 % — ABNORMAL HIGH (ref 11.5–15.5)
WBC: 15.8 10*3/uL — ABNORMAL HIGH (ref 4.0–10.5)

## 2014-04-12 IMAGING — CR DG KNEE COMPLETE 4+V*R*
4 series · 4 of 4 positions shown · non-contrast
Comparison: 06/21/2011

CLINICAL DATA: Sickle cell crisis with knee pain and swelling.

EXAM:
RIGHT KNEE - COMPLETE 4+ VIEW

[x knee lat right]
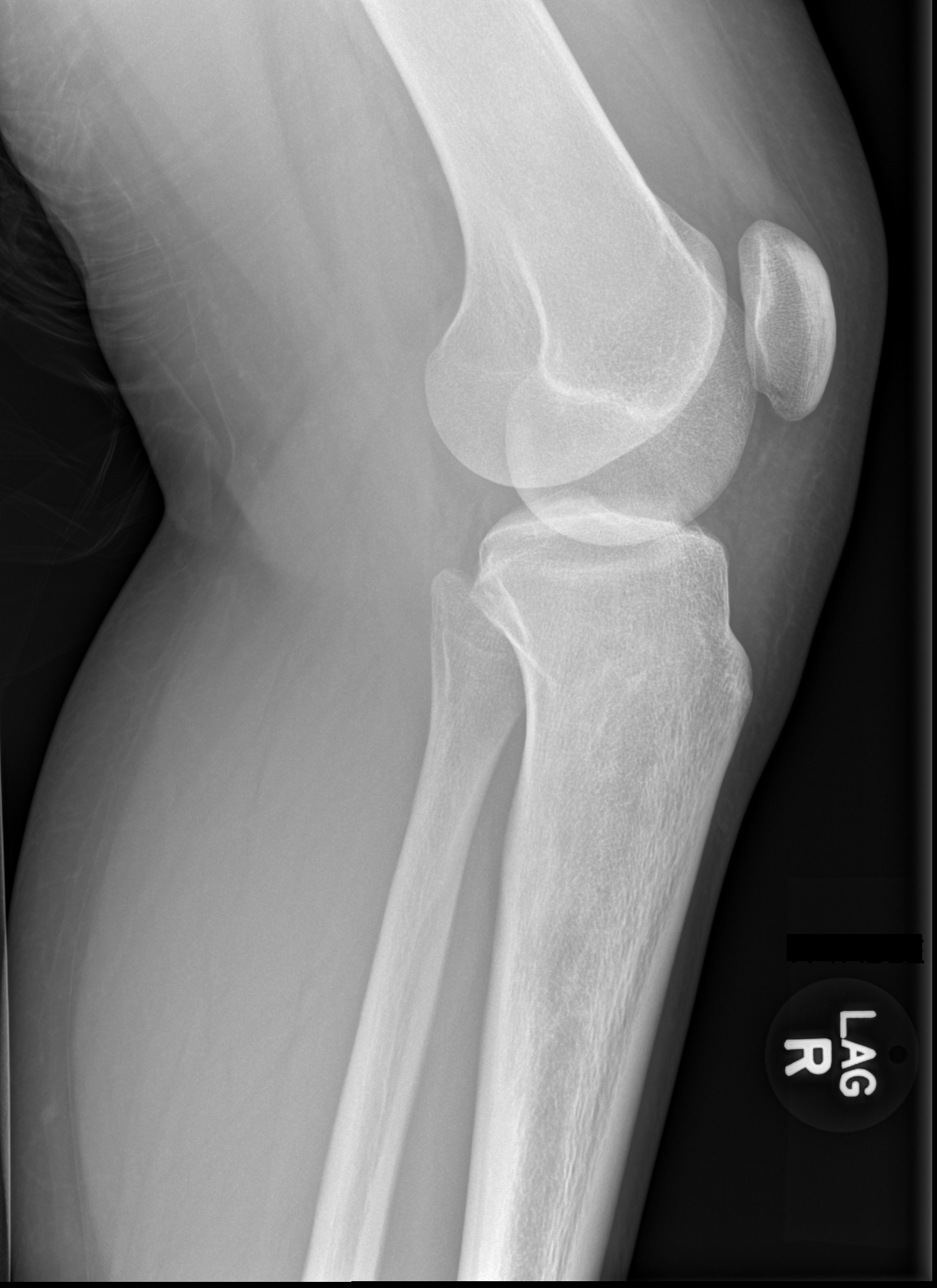

[x knee ap right]
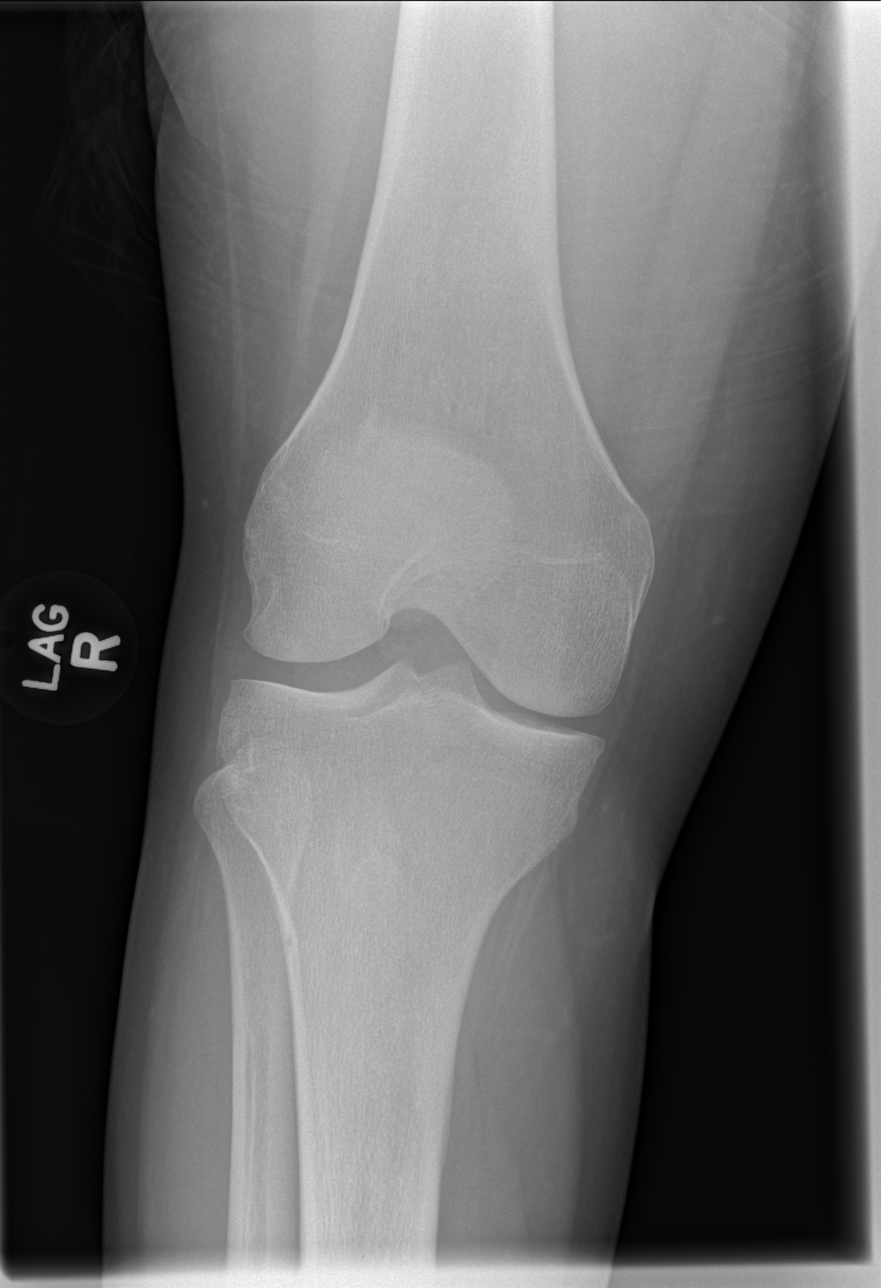

[x knee obl right (1 of 2)]
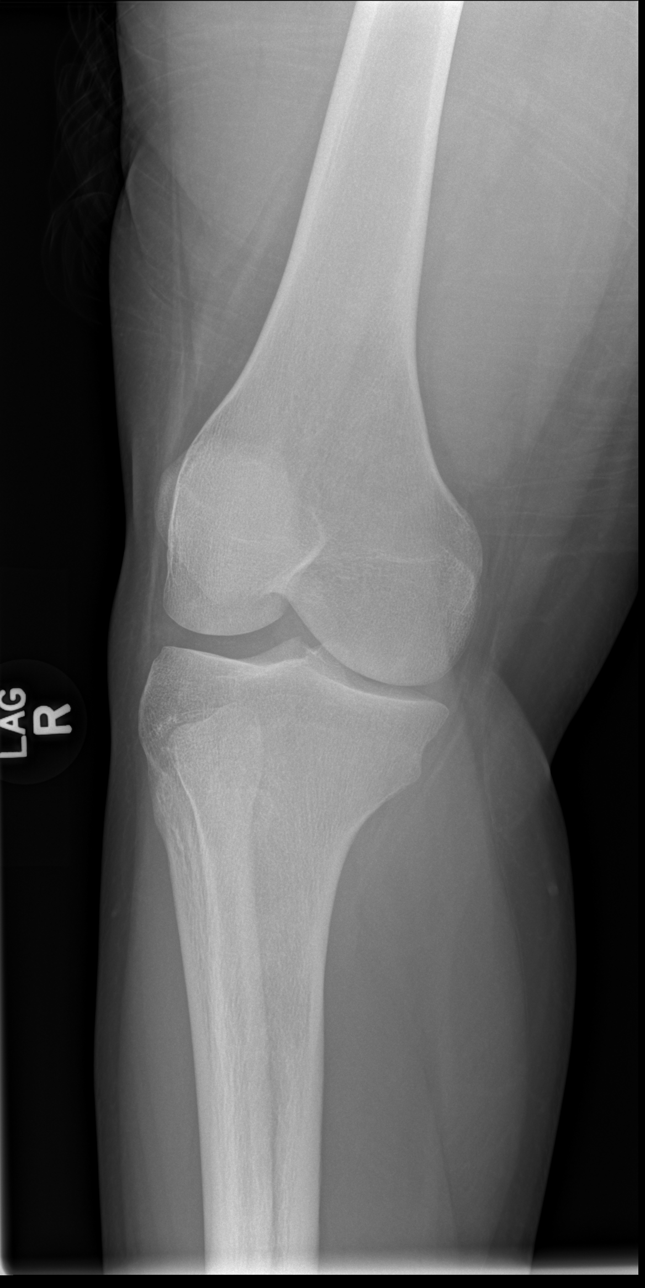

[x knee obl right (2 of 2)]
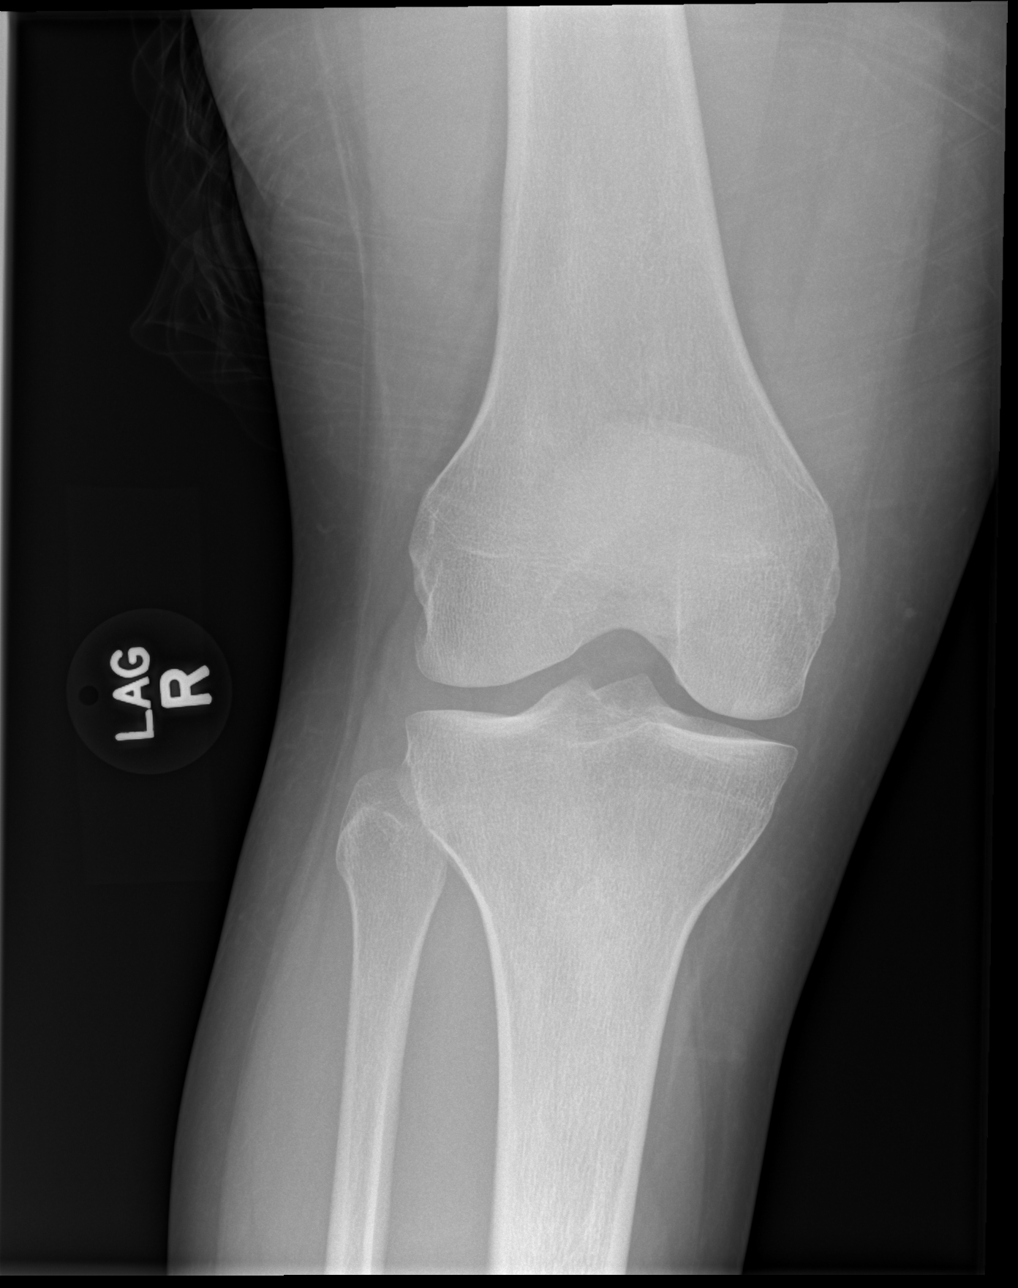

[4 of 4 positions shown; findings below may reference images not displayed]

FINDINGS: Patchy areas of increased density in the distal femur and proximal
tibia are stable from prior, evaluated by intervening CT. No acute
fracture malalignment. No joint effusion.
IMPRESSION: Stable exam.  No acute osseous abnormality.

## 2014-04-12 IMAGING — XA IR FLUORO GUIDE CV LINE*R*
1 series · 2 of 2 positions shown · non-contrast
Comparison: none

CLINICAL DATA: Sickle cell crisis ; central venous access is
requested for fluids and medications.

EXAM:
IR RIGHT FLUORO GUIDE CV LINE; IR US GUIDED VASC ACCESS RIGHT
TECHNIQUE: The right arm was prepped with chlorhexidine, draped in the usual
sterile fashion using maximum barrier technique (cap and mask,
sterile gown, sterile gloves, large sterile sheet, hand hygiene and
cutaneous antiseptic). Local anesthesia was attained by infiltration
with 1% lidocaine.

[Series 300: ir fluoro guide cv line*r* · 2 of 2 slices shown]
[im 1/2]
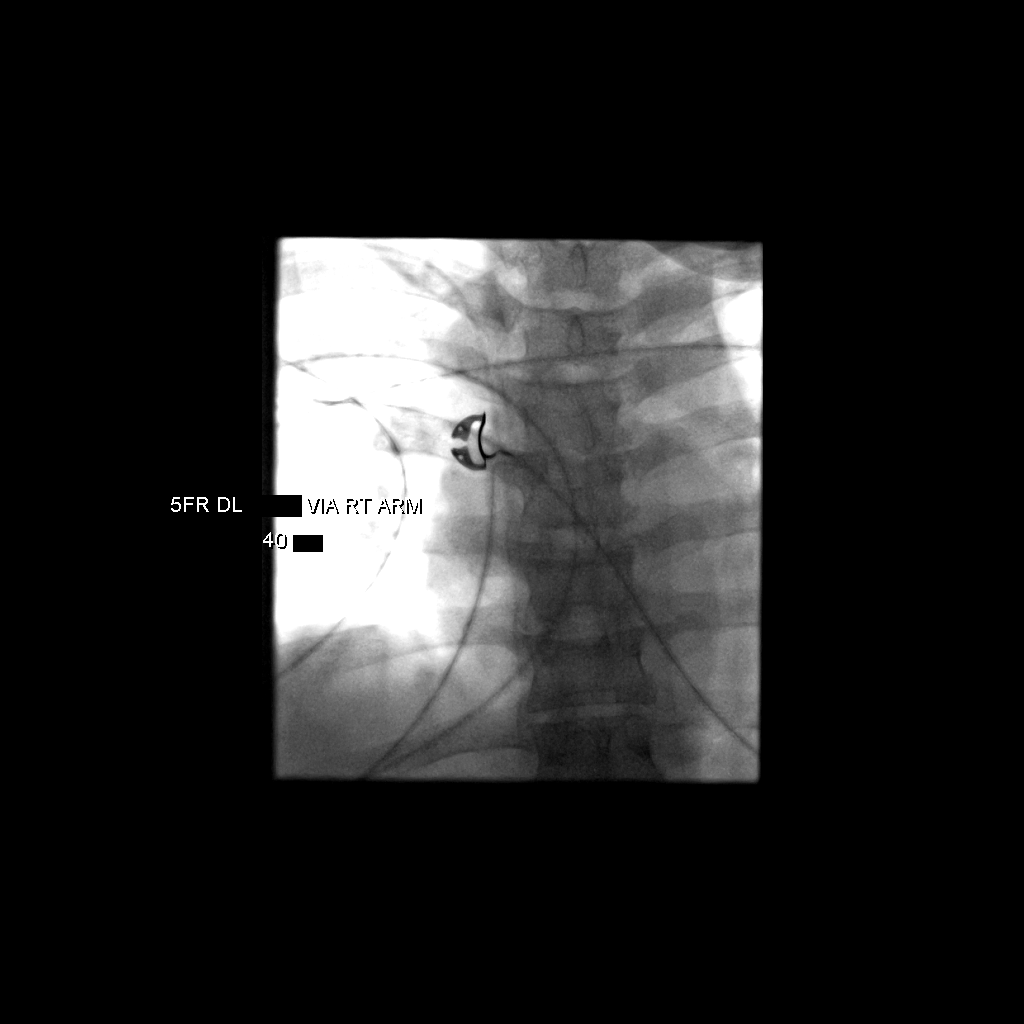
[im 2/2]
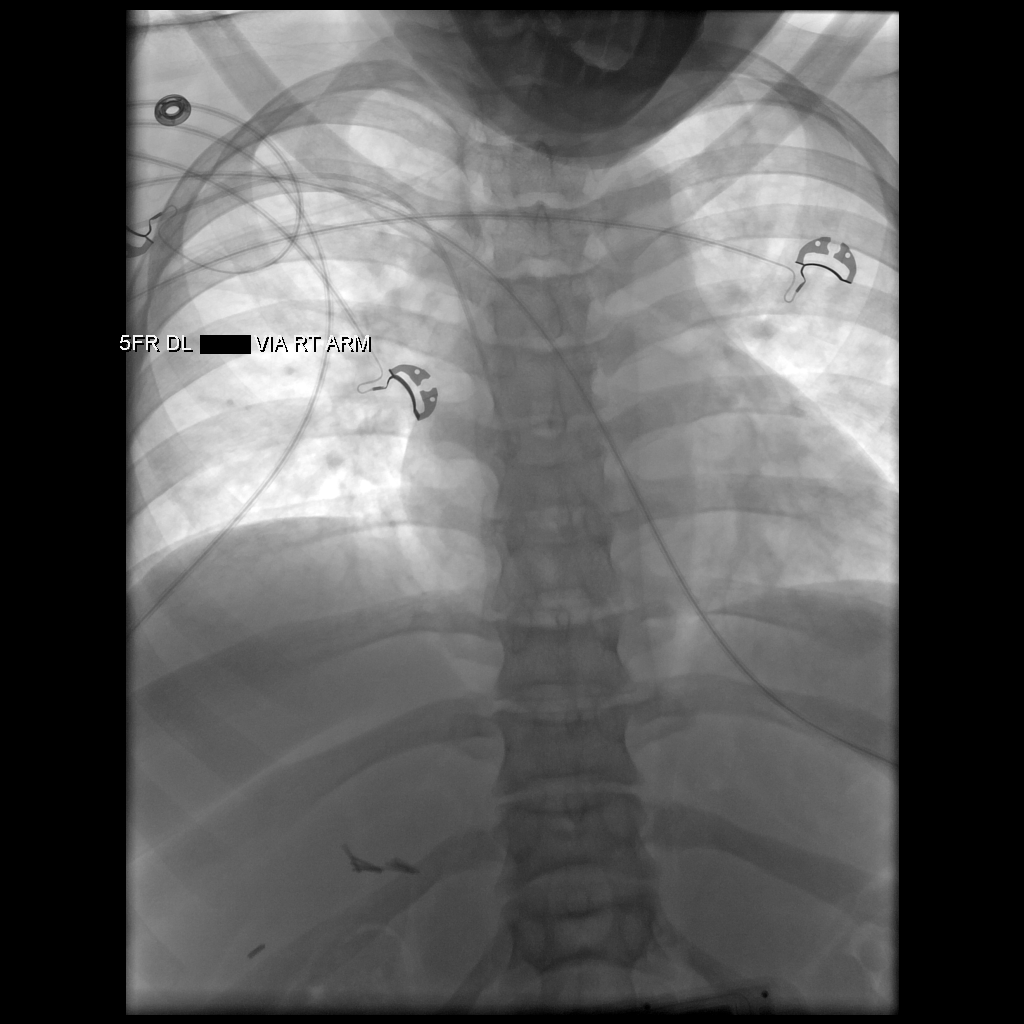

[2 of 2 positions shown; findings below may reference images not displayed]

Ultrasound demonstrated patency of the right basilic vein, and this
was documented with an image. Under real-time ultrasound guidance,
this vein was accessed with a 21 gauge micropuncture needle and
image documentation was performed. The needle was exchanged over a
guidewire for a peel-away sheath through which a 40 cm 5 French
double lumen power injectable PICC was advanced, and positioned with
its tip at the lower SVC/right atrial junction. Fluoroscopy during
the procedure and fluoro spot radiograph confirms appropriate
catheter position. The catheter was flushed, secured to the skin
with Prolene sutures, and covered with a sterile dressing.

FLUOROSCOPY TIME:  84 seconds

COMPLICATIONS:
None.  The patient tolerated the procedure well.
IMPRESSION: Successful placement of a right arm PICC with sonographic and
fluoroscopic guidance. The catheter is ready for use.

## 2014-04-12 MED ORDER — HYDROMORPHONE 2 MG/ML HIGH CONCENTRATION IV PCA SOLN
INTRAVENOUS | Status: DC
  Administered 2014-04-12: 1.63 mg via INTRAVENOUS
  Administered 2014-04-12: 6.91 mg via INTRAVENOUS
  Administered 2014-04-13: 2 mg via INTRAVENOUS
  Administered 2014-04-13: 2.1 mg via INTRAVENOUS
  Administered 2014-04-13: 4.94 mg via INTRAVENOUS
  Administered 2014-04-13: 5.62 mg via INTRAVENOUS
  Administered 2014-04-13: 12.4 mg via INTRAVENOUS
  Administered 2014-04-14: 1.25 mg via INTRAVENOUS
  Administered 2014-04-14: 1.4 mg via INTRAVENOUS
  Administered 2014-04-14: 5.6 mg via INTRAVENOUS
  Administered 2014-04-14: 1.4 mg via INTRAVENOUS
  Administered 2014-04-15: 4.52 mg via INTRAVENOUS
  Administered 2014-04-15: 3.5 mg via INTRAVENOUS
  Administered 2014-04-15: 5.6 mg via INTRAVENOUS
  Administered 2014-04-15: 2.72 mg via INTRAVENOUS
  Administered 2014-04-15: 4.2 mg via INTRAVENOUS
  Administered 2014-04-15: 08:00:00 via INTRAVENOUS
  Administered 2014-04-15: 5.35 mg via INTRAVENOUS
  Administered 2014-04-16: 2.33 mg via INTRAVENOUS
  Administered 2014-04-16: 3.09 mg via INTRAVENOUS
  Administered 2014-04-16: 4.7 mg via INTRAVENOUS
  Administered 2014-04-16: 11.9 mg via INTRAVENOUS
  Administered 2014-04-16: 21:00:00 via INTRAVENOUS
  Administered 2014-04-16: 7.7 mg via INTRAVENOUS
  Administered 2014-04-17: 3.55 mg via INTRAVENOUS
  Administered 2014-04-17: 6.3 mg via INTRAVENOUS
  Administered 2014-04-17: 7.67 mg via INTRAVENOUS
  Administered 2014-04-17: 4.25 mg via INTRAVENOUS
  Administered 2014-04-17: 0.28 mg via INTRAVENOUS
  Administered 2014-04-17: 5.95 mg via INTRAVENOUS
  Administered 2014-04-18: 13:00:00 via INTRAVENOUS
  Administered 2014-04-18: 4.2 mg via INTRAVENOUS
  Administered 2014-04-18: 1.86 mg via INTRAVENOUS
  Administered 2014-04-18: 5.18 mg via INTRAVENOUS
  Filled 2014-04-12 (×5): qty 25

## 2014-04-12 NOTE — Plan of Care (Signed)
Problem: Phase I Progression Outcomes Goal: Pain controlled with appropriate interventions Outcome: Completed/Met Date Met:  04/12/14 Goal: Pulmonary Hygiene as Indicated (Sickle Cell) Outcome: Completed/Met Date Met:  04/12/14 Goal: Call (SCDAP) [facility] Outcome: Not Applicable Date Met:  09/32/35 Goal: [facility] [phone]) Outcome: Not Applicable Date Met:  54/27/06 Goal: Initial discharge plan identified Outcome: Completed/Met Date Met:  04/12/14 Goal: Bowel Movement At Least Every 3 Days Outcome: Not Progressing Pt requests different laxative other than sennacot

## 2014-04-12 NOTE — Progress Notes (Signed)
Subjective: Patient still having a 8/10 pain. Has been on Dilaudid PCA as well as Physician assisted dosing. She has been getting her Physician assisted dosing on time and has used more than 45 mg in the last 24 hours. No SOB, No cough, no fever.  Objective: Vital signs in last 24 hours: Temp:  [98 F (36.7 C)-99.3 F (37.4 C)] 98.2 F (36.8 C) (11/28 1425) Pulse Rate:  [73-111] 80 (11/28 1425) Resp:  [10-15] 13 (11/28 1425) BP: (112-126)/(67-86) 115/69 mmHg (11/28 1425) SpO2:  [94 %-99 %] 99 % (11/28 1425) Weight:  [74.435 kg (164 lb 1.6 oz)] 74.435 kg (164 lb 1.6 oz) (11/28 0527) Weight change: -0.136 kg (-4.8 oz) Last BM Date:  (PTA)  Intake/Output from previous day: 11/27 0701 - 11/28 0700 In: 2266.7 [P.O.:610; I.V.:1656.7] Out: 1900 [Urine:1900] Intake/Output this shift: Total I/O In: 2500 [P.O.:600; I.V.:1900] Out: 2400 [Urine:2400]  General appearance: alert, cooperative and mild distress Eyes: conjunctivae/corneas clear. PERRL, EOM's intact. Fundi benign. Throat: lips, mucosa, and tongue normal; teeth and gums normal Neck: no adenopathy, no carotid bruit, no JVD, supple, symmetrical, trachea midline and thyroid not enlarged, symmetric, no tenderness/mass/nodules Back: symmetric, no curvature. ROM normal. No CVA tenderness. Resp: clear to auscultation bilaterally Chest wall: no tenderness Cardio: regular rate and rhythm, S1, S2 normal, no murmur, click, rub or gallop GI: soft, non-tender; bowel sounds normal; no masses,  no organomegaly Extremities: extremities normal, atraumatic, no cyanosis or edema Pulses: 2+ and symmetric Skin: Skin color, texture, turgor normal. No rashes or lesions Neurologic: Grossly normal  Lab Results:  Recent Labs  04/11/14 0533 04/12/14 0409  WBC 13.3* 15.8*  HGB 8.3* 7.9*  HCT 24.2* 22.6*  PLT 370 386   BMET  Recent Labs  04/11/14 0533 04/12/14 0409  NA 138 136*  K 3.9 3.8  CL 102 101  CO2 26 25  GLUCOSE 93 107*  BUN 7  8  CREATININE 0.69 0.70  CALCIUM 8.7 8.6    Studies/Results: No results found.  Medications: I have reviewed the patient's current medications.  Assessment/Plan: A 23 yo woman admitted with Sickle cell Painful crisis.  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 Continue with the Physician assisted dosing.  #2 Sickle Cell anemia: H&H is reduced No evidence of hemolysis. Continue to monitor.  #3 Leucocytosis: due to VOC. Continue to monitor.  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   LOS: 3 days   GARBA,LAWAL 04/12/2014, 5:16 PM

## 2014-04-13 LAB — CBC WITH DIFFERENTIAL/PLATELET
Basophils Absolute: 0 10*3/uL (ref 0.0–0.1)
Basophils Relative: 0 % (ref 0–1)
EOS ABS: 0.2 10*3/uL (ref 0.0–0.7)
EOS PCT: 1 % (ref 0–5)
HCT: 21.3 % — ABNORMAL LOW (ref 36.0–46.0)
Hemoglobin: 7.5 g/dL — ABNORMAL LOW (ref 12.0–15.0)
Lymphocytes Relative: 9 % — ABNORMAL LOW (ref 12–46)
Lymphs Abs: 1.5 10*3/uL (ref 0.7–4.0)
MCH: 33.3 pg (ref 26.0–34.0)
MCHC: 35.2 g/dL (ref 30.0–36.0)
MCV: 94.7 fL (ref 78.0–100.0)
MONOS PCT: 6 % (ref 3–12)
Monocytes Absolute: 1 10*3/uL (ref 0.1–1.0)
NEUTROS PCT: 84 % — AB (ref 43–77)
Neutro Abs: 13.4 10*3/uL — ABNORMAL HIGH (ref 1.7–7.7)
Platelets: 363 10*3/uL (ref 150–400)
RBC: 2.25 MIL/uL — ABNORMAL LOW (ref 3.87–5.11)
RDW: 18.9 % — ABNORMAL HIGH (ref 11.5–15.5)
WBC: 16.1 10*3/uL — ABNORMAL HIGH (ref 4.0–10.5)

## 2014-04-13 LAB — COMPREHENSIVE METABOLIC PANEL
ALBUMIN: 3.3 g/dL — AB (ref 3.5–5.2)
ALT: 13 U/L (ref 0–35)
ANION GAP: 9 (ref 5–15)
AST: 18 U/L (ref 0–37)
Alkaline Phosphatase: 54 U/L (ref 39–117)
BUN: 7 mg/dL (ref 6–23)
CALCIUM: 8.6 mg/dL (ref 8.4–10.5)
CO2: 26 mEq/L (ref 19–32)
Chloride: 100 mEq/L (ref 96–112)
Creatinine, Ser: 0.86 mg/dL (ref 0.50–1.10)
GFR calc non Af Amer: >90 mL/min (ref >90)
Glucose, Bld: 114 mg/dL — ABNORMAL HIGH (ref 70–99)
POTASSIUM: 3.8 meq/L (ref 3.7–5.3)
SODIUM: 135 meq/L — AB (ref 137–147)
TOTAL PROTEIN: 6.4 g/dL (ref 6.0–8.3)
Total Bilirubin: 1.9 mg/dL — ABNORMAL HIGH (ref 0.3–1.2)

## 2014-04-13 MED ORDER — OXYCODONE-ACETAMINOPHEN 10-325 MG PO TABS: 1.0000 | ORAL_TABLET | ORAL | Status: DC | PRN

## 2014-04-13 MED ORDER — ALPRAZOLAM 1 MG PO TABS
2.0000 mg | ORAL_TABLET | Freq: Two times a day (BID) | ORAL | Status: DC | PRN
  Administered 2014-04-13: 2 mg via ORAL
  Filled 2014-04-13 (×2): qty 2

## 2014-04-13 MED ORDER — OXYCODONE HCL 5 MG PO TABS
5.0000 mg | ORAL_TABLET | ORAL | Status: DC | PRN
  Administered 2014-04-13 – 2014-04-14 (×2): 5 mg via ORAL
  Filled 2014-04-13 (×2): qty 1

## 2014-04-13 MED ORDER — OXYCODONE-ACETAMINOPHEN 5-325 MG PO TABS
1.0000 | ORAL_TABLET | ORAL | Status: DC | PRN
  Administered 2014-04-13 – 2014-04-14 (×2): 1 via ORAL
  Filled 2014-04-13 (×2): qty 1

## 2014-04-13 MED ORDER — OXYCODONE HCL 5 MG PO TABS
15.0000 mg | ORAL_TABLET | Freq: Four times a day (QID) | ORAL | Status: DC | PRN
Start: 2014-04-13 — End: 2014-04-18
  Administered 2014-04-18: 15 mg via ORAL
  Filled 2014-04-13: qty 3

## 2014-04-13 MED ORDER — HYDROMORPHONE HCL 2 MG/ML IJ SOLN
2.0000 mg | INTRAMUSCULAR | Status: DC | PRN
  Administered 2014-04-13 – 2014-04-14 (×2): 2 mg via INTRAVENOUS
  Filled 2014-04-13 (×2): qty 1

## 2014-04-13 NOTE — Progress Notes (Signed)
Subjective: Patient is constipated last 2 days. Pain level is down to 6/10. Has used 43.25 mg of Dilaudid in last 24 hour with 56 demands and 52 deliveries. No SOB, no fever, no NVD. She has been spending lots of time in the Bathroom.  Objective: Vital signs in last 24 hours: Temp:  [98.1 F (36.7 C)-100.1 F (37.8 C)] 98.1 F (36.7 C) (11/29 1405) Pulse Rate:  [76-108] 76 (11/29 1405) Resp:  [13-17] 15 (11/29 1405) BP: (104-120)/(55-73) 120/69 mmHg (11/29 1405) SpO2:  [93 %-100 %] 100 % (11/29 1405) Weight:  [74.39 kg (164 lb)] 74.39 kg (164 lb) (11/29 0555) Weight change: -0.045 kg (-1.6 oz) Last BM Date:  (pta)  Intake/Output from previous day: 11/28 0701 - 11/29 0700 In: 7801.7 [P.O.:600; I.V.:7201.7] Out: 2850 [Urine:2850] Intake/Output this shift: Total I/O In: 600 [P.O.:600] Out: 2200 [Urine:2200]  General appearance: alert, cooperative and mild distress Eyes: conjunctivae/corneas clear. PERRL, EOM's intact. Fundi benign. Throat: lips, mucosa, and tongue normal; teeth and gums normal Neck: no adenopathy, no carotid bruit, no JVD, supple, symmetrical, trachea midline and thyroid not enlarged, symmetric, no tenderness/mass/nodules Back: symmetric, no curvature. ROM normal. No CVA tenderness. Resp: clear to auscultation bilaterally Chest wall: no tenderness Cardio: regular rate and rhythm, S1, S2 normal, no murmur, click, rub or gallop GI: soft, non-tender; bowel sounds normal; no masses,  no organomegaly Extremities: extremities normal, atraumatic, no cyanosis or edema Pulses: 2+ and symmetric Skin: Skin color, texture, turgor normal. No rashes or lesions Neurologic: Grossly normal  Lab Results:  Recent Labs  04/12/14 0409 04/13/14 0505  WBC 15.8* 16.1*  HGB 7.9* 7.5*  HCT 22.6* 21.3*  PLT 386 363   BMET  Recent Labs  04/12/14 0409 04/13/14 0505  NA 136* 135*  K 3.8 3.8  CL 101 100  CO2 25 26  GLUCOSE 107* 114*  BUN 8 7  CREATININE 0.70 0.86   CALCIUM 8.6 8.6    Studies/Results: No results found.  Medications: I have reviewed the patient's current medications.  Assessment/Plan: A 23 yo woman admitted with Sickle cell Painful crisis.  #1 Sickle Cell Painful Crisis: Patient is slowly improving. Will Discontinue the Physician assisted dosing today. Will advance her oral intake and plan for possible Discharge tomorrow.  #2 Sickle Cell anemia: H&H is reduced again but probably hemodilution.  Will recheck HDL and T. Bil in am. Continue to monitor.  #3 Leucocytosis: Increasing. Due to VOC. Continue to monitor.  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   #6 Constipation: Will advance her Laxatives  LOS: 4 days   GARBA,LAWAL 04/13/2014, 4:53 PM

## 2014-04-14 ENCOUNTER — Inpatient Hospital Stay (HOSPITAL_COMMUNITY): Payer: Medicare Other

## 2014-04-14 LAB — CBC WITH DIFFERENTIAL/PLATELET
Basophils Absolute: 0 10*3/uL (ref 0.0–0.1)
Basophils Relative: 0 % (ref 0–1)
EOS ABS: 0 10*3/uL (ref 0.0–0.7)
Eosinophils Relative: 0 % (ref 0–5)
HCT: 19.2 % — ABNORMAL LOW (ref 36.0–46.0)
Hemoglobin: 6.8 g/dL — CL (ref 12.0–15.0)
Lymphocytes Relative: 7 % — ABNORMAL LOW (ref 12–46)
Lymphs Abs: 1.6 10*3/uL (ref 0.7–4.0)
MCH: 32.4 pg (ref 26.0–34.0)
MCHC: 35.4 g/dL (ref 30.0–36.0)
MCV: 91.4 fL (ref 78.0–100.0)
MONO ABS: 1.1 10*3/uL — AB (ref 0.1–1.0)
Monocytes Relative: 5 % (ref 3–12)
NEUTROS PCT: 88 % — AB (ref 43–77)
Neutro Abs: 19.9 10*3/uL — ABNORMAL HIGH (ref 1.7–7.7)
Platelets: 324 10*3/uL (ref 150–400)
RBC: 2.1 MIL/uL — ABNORMAL LOW (ref 3.87–5.11)
RDW: 19.5 % — ABNORMAL HIGH (ref 11.5–15.5)
WBC: 22.6 10*3/uL — ABNORMAL HIGH (ref 4.0–10.5)

## 2014-04-14 LAB — URINALYSIS, ROUTINE W REFLEX MICROSCOPIC
Bilirubin Urine: NEGATIVE
GLUCOSE, UA: NEGATIVE mg/dL
Hgb urine dipstick: NEGATIVE
KETONES UR: NEGATIVE mg/dL
Leukocytes, UA: NEGATIVE
Nitrite: NEGATIVE
Protein, ur: NEGATIVE mg/dL
Specific Gravity, Urine: 1.008 (ref 1.005–1.030)
Urobilinogen, UA: 1 mg/dL (ref 0.0–1.0)
pH: 6 (ref 5.0–8.0)

## 2014-04-14 LAB — COMPREHENSIVE METABOLIC PANEL
ALT: 16 U/L (ref 0–35)
AST: 25 U/L (ref 0–37)
Albumin: 3.3 g/dL — ABNORMAL LOW (ref 3.5–5.2)
Alkaline Phosphatase: 58 U/L (ref 39–117)
Anion gap: 11 (ref 5–15)
BUN: 5 mg/dL — ABNORMAL LOW (ref 6–23)
CALCIUM: 8.6 mg/dL (ref 8.4–10.5)
CO2: 25 mEq/L (ref 19–32)
Chloride: 100 mEq/L (ref 96–112)
Creatinine, Ser: 0.66 mg/dL (ref 0.50–1.10)
GFR calc non Af Amer: >90 mL/min (ref >90)
GLUCOSE: 111 mg/dL — AB (ref 70–99)
Potassium: 3.8 mEq/L (ref 3.7–5.3)
Sodium: 136 mEq/L — ABNORMAL LOW (ref 137–147)
TOTAL PROTEIN: 6.6 g/dL (ref 6.0–8.3)
Total Bilirubin: 3.8 mg/dL — ABNORMAL HIGH (ref 0.3–1.2)

## 2014-04-14 LAB — GLUCOSE, CAPILLARY: Glucose-Capillary: 109 mg/dL — ABNORMAL HIGH (ref 70–99)

## 2014-04-14 MED ORDER — LEVOFLOXACIN IN D5W 750 MG/150ML IV SOLN
750.0000 mg | INTRAVENOUS | Status: DC
  Administered 2014-04-14 – 2014-04-15 (×2): 750 mg via INTRAVENOUS
  Filled 2014-04-14 (×2): qty 150

## 2014-04-14 MED ORDER — ACETAMINOPHEN 325 MG PO TABS
650.0000 mg | ORAL_TABLET | Freq: Four times a day (QID) | ORAL | Status: DC | PRN
  Administered 2014-04-14 – 2014-04-18 (×4): 650 mg via ORAL
  Filled 2014-04-14 (×4): qty 2

## 2014-04-14 MED ORDER — HYDROMORPHONE HCL 2 MG/ML IJ SOLN
2.0000 mg | INTRAMUSCULAR | Status: DC | PRN
  Administered 2014-04-14 – 2014-04-15 (×6): 2 mg via INTRAVENOUS
  Filled 2014-04-14 (×6): qty 1

## 2014-04-14 MED ORDER — VANCOMYCIN HCL IN DEXTROSE 1-5 GM/200ML-% IV SOLN
1000.0000 mg | Freq: Three times a day (TID) | INTRAVENOUS | Status: DC
  Administered 2014-04-14 – 2014-04-15 (×3): 1000 mg via INTRAVENOUS
  Filled 2014-04-14 (×4): qty 200

## 2014-04-14 MED ORDER — NALOXONE HCL 0.4 MG/ML IJ SOLN: 0.4000 mg | INTRAMUSCULAR | Status: DC | PRN

## 2014-04-14 MED ORDER — PIPERACILLIN-TAZOBACTAM 3.375 G IVPB
3.3750 g | Freq: Three times a day (TID) | INTRAVENOUS | Status: DC
  Administered 2014-04-14 – 2014-04-15 (×4): 3.375 g via INTRAVENOUS
  Filled 2014-04-14 (×5): qty 50

## 2014-04-14 NOTE — Progress Notes (Signed)
Notified [name] of pt being difficult to awaken, heart rate 150, temp 103.4.  Orders received and carried out.  Will await results.  Pt c/o sore throat on right side.  Assessed throat and pt has a swollen uvula with an open wound on it.  Pt states she feels like she has strep throat.  Narcan not given because pt awakened and ambulated to bathroom.  Pt states she just sleeps really deep and is always hard to awaken.

## 2014-04-14 NOTE — Progress Notes (Signed)
Subjective: Patient now has SOB, positive CXR for bilateral infiltrates and fever. Has been on the Dilaudid PCA. There is increased Total bilurubin from 1.6 to 3.8 indicating acute hemolysis. Patient has used 45.2 mg of the Dilaudid in the last 24 hours was 78 demands and 70 deliveries. She is now in severe pain and shaking. She has cough on shortness of breath but no nausea vomiting or diarrhea. She had one bowel movement last night.  Objective: Vital signs in last 24 hours: Temp:  [98.1 F (36.7 C)-103.4 F (39.7 C)] 98.3 F (36.8 C) (11/30 1056) Pulse Rate:  [76-145] 109 (11/30 1056) Resp:  [12-23] 16 (11/30 1056) BP: (94-120)/(52-69) 94/52 mmHg (11/30 1056) SpO2:  [94 %-100 %] 100 % (11/30 1056) Weight:  [78.1 kg (172 lb 2.9 oz)] 78.1 kg (172 lb 2.9 oz) (11/30 0200) Weight change: 3.71 kg (8 lb 2.9 oz) Last BM Date:  (pta)  Intake/Output from previous day: 11/29 0701 - 11/30 0700 In: 3013.3 [P.O.:840; I.V.:2173.3] Out: 5250 [Urine:5250] Intake/Output this shift: Total I/O In: -  Out: 700 [Urine:700]  General appearance: alert, cooperative and mild distress Eyes: conjunctivae/corneas clear. PERRL, EOM's intact. Fundi benign. Throat: lips, mucosa, and tongue normal; teeth and gums normal Neck: no adenopathy, no carotid bruit, no JVD, supple, symmetrical, trachea midline and thyroid not enlarged, symmetric, no tenderness/mass/nodules Back: symmetric, no curvature. ROM normal. No CVA tenderness. Resp: clear to auscultation bilaterally Chest wall: no tenderness Cardio: regular rate and rhythm, S1, S2 normal, no murmur, click, rub or gallop GI: soft, non-tender; bowel sounds normal; no masses,  no organomegaly Extremities: extremities normal, atraumatic, no cyanosis or edema Pulses: 2+ and symmetric Skin: Skin color, texture, turgor normal. No rashes or lesions Neurologic: Grossly normal  Lab Results:  Recent Labs  04/13/14 0505 04/14/14 0545  WBC 16.1* 22.6*  HGB 7.5*  6.8*  HCT 21.3* 19.2*  PLT 363 324   BMET  Recent Labs  04/13/14 0505 04/14/14 0545  NA 135* 136*  K 3.8 3.8  CL 100 100  CO2 26 25  GLUCOSE 114* 111*  BUN 7 5*  CREATININE 0.86 0.66  CALCIUM 8.6 8.6    Studies/Results: Dg Chest Port 1 View  04/14/2014   CLINICAL DATA:  Fever.  Sickle cell.  EXAM: PORTABLE CHEST - 1 VIEW  COMPARISON:  02/16/2014  FINDINGS: Bilateral airspace disease has developed since the prior study and may represent pneumonia. Atelectasis and fluid overload also possible. No effusion.  Cardiac enlargement.  Right arm PICC tip in the SVC  Bony changes of sickle cell.  IMPRESSION: Diffuse bilateral airspace disease may represent pneumonia or edema.   Electronically Signed   By: Franchot Gallo M.D.   On: 04/14/2014 07:39    Medications: I have reviewed the patient's current medications.  Assessment/Plan: A 23 yo woman admitted with Sickle cell Painful crisis.  #1 healthcare associated pneumonia: This is near and just developed. Patient will be initiated on IV vancomycin Zosyn as well as Levaquin for healthcare associated pneumonia.  #2.Sickle Cell Painful Crisis: I will restart her physician assisted dosing at 2 mg every 2 hours to document how PCA. Continue other supportive care.  #3 Sickle Cell anemia: Patient has evidence of hemolytic anemia. Her total bilirubin has double overnight. We will monitor her hemoglobin closely and transfuse if it drops to less than 5.5.  #3 Leucocytosis: Increasing. Due to VOC. Continue to monitor.  #4 Hyponatremia: Slowly improving. Continue hydration  #5 Thrombocythemia: Seems improved   #6  Constipation: Patient had one bowel movement yesterday. Continue current medications.  LOS: 5 days   Lyndon Chapel,LAWAL 04/14/2014, 11:28 AM

## 2014-04-14 NOTE — Progress Notes (Signed)
ANTIBIOTIC CONSULT NOTE - INITIAL  Pharmacy Consult for Zosyn / Vanco Indication: pneumonia  Allergies  Allergen Reactions  . Carrot Oil Hives and Swelling  . Latex Rash    Patient Measurements: Height: 5\' 3"  (160 cm) Weight: 172 lb 2.9 oz (78.1 kg) IBW/kg (Calculated) : 52.4  Vital Signs: Temp: 98.3 F (36.8 C) (11/30 1056) Temp Source: Oral (11/30 1056) BP: 94/52 mmHg (11/30 1056) Pulse Rate: 109 (11/30 1056) Intake/Output from previous day: 11/29 0701 - 11/30 0700 In: 3013.3 [P.O.:840; I.V.:2173.3] Out: 5250 [Urine:5250] Intake/Output from this shift: Total I/O In: -  Out: 700 [Urine:700]  Labs:  Recent Labs  04/12/14 0409 04/13/14 0505 04/14/14 0545  WBC 15.8* 16.1* 22.6*  HGB 7.9* 7.5* 6.8*  PLT 386 363 324  CREATININE 0.70 0.86 0.66   Estimated Creatinine Clearance: 109.2 mL/min (by C-G formula based on Cr of 0.66). No results for input(s): VANCOTROUGH, VANCOPEAK, VANCORANDOM, GENTTROUGH, GENTPEAK, GENTRANDOM, TOBRATROUGH, TOBRAPEAK, TOBRARND, AMIKACINPEAK, AMIKACINTROU, AMIKACIN in the last 72 hours.   Microbiology: No results found for this or any previous visit (from the past 720 hour(s)).  Medical History: Past Medical History  Diagnosis Date  . H/O: 1 miscarriage 03/22/2011    Pt reports 2 miscarriages.  [name] 01/08/2009  . Depression 01/06/2011  . GERD (gastroesophageal reflux disease) 02/17/2011  . Trichotillomania     h/o  . Blood transfusion     "lots"  . Sickle cell anemia with crisis   . Exertional dyspnea     "sometimes"  . Sickle cell anemia   . Headache(784.0)   . Migraines 11/08/11    "@ least twice/month"  . Chronic back pain     "very severe; have knot in my back; from tight muscle; take RX and exercise for it"  . Mood swings 11/08/11    "I go back and forth; real bad"  . Pregnancy   . Blood dyscrasia     SICKLE CELL    Assessment: 67 yoF admitted with Sickle cell crisis. Patient now has SOB, positive CXR for  bilateral infiltrates and fever. Pharmacy consulted to dose Zosyn and Vancomycin for pneumonia.  11/30 >>Zosyn>> 11/30 >>Vanco>>  11/30 >> Levaquin >>  Tmax: 102.2 WBCs: elevated Renal: SCr 0.66 CrCl > 100  11/30 blood: sent  Goal of Therapy:  Vancomycin trough level 15-20 mcg/ml  Appropriate antibiotic dosing for renal function; eradication of infection  Plan:  Start Zosyn 3.375g IV Q8H (extended over 4 hours) Start Vancomycin 1g IV Q8H Measure antibiotic drug levels at steady state Follow up culture results  Kizzie Furnish, PharmD Pager: [phone] 04/14/2014 12:17 PM

## 2014-04-15 ENCOUNTER — Inpatient Hospital Stay (HOSPITAL_COMMUNITY): Payer: Medicare Other

## 2014-04-15 DIAGNOSIS — B9689 Other specified bacterial agents as the cause of diseases classified elsewhere: Secondary | ICD-10-CM | POA: Diagnosis not present

## 2014-04-15 DIAGNOSIS — J028 Acute pharyngitis due to other specified organisms: Secondary | ICD-10-CM

## 2014-04-15 DIAGNOSIS — J029 Acute pharyngitis, unspecified: Secondary | ICD-10-CM

## 2014-04-15 DIAGNOSIS — K5901 Slow transit constipation: Secondary | ICD-10-CM

## 2014-04-15 DIAGNOSIS — J358 Other chronic diseases of tonsils and adenoids: Secondary | ICD-10-CM

## 2014-04-15 LAB — COMPREHENSIVE METABOLIC PANEL
ALK PHOS: 65 U/L (ref 39–117)
ALT: 23 U/L (ref 0–35)
ANION GAP: 12 (ref 5–15)
AST: 38 U/L — ABNORMAL HIGH (ref 0–37)
Albumin: 3.3 g/dL — ABNORMAL LOW (ref 3.5–5.2)
BILIRUBIN TOTAL: 4.6 mg/dL — AB (ref 0.3–1.2)
BUN: 8 mg/dL (ref 6–23)
CHLORIDE: 98 meq/L (ref 96–112)
CO2: 27 meq/L (ref 19–32)
CREATININE: 0.95 mg/dL (ref 0.50–1.10)
Calcium: 8.9 mg/dL (ref 8.4–10.5)
GFR calc Af Amer: >90 mL/min (ref >90)
GFR calc non Af Amer: 84 mL/min — ABNORMAL LOW (ref >90)
Glucose, Bld: 120 mg/dL — ABNORMAL HIGH (ref 70–99)
Potassium: 4.2 mEq/L (ref 3.7–5.3)
Sodium: 137 mEq/L (ref 137–147)
Total Protein: 6.9 g/dL (ref 6.0–8.3)

## 2014-04-15 LAB — URINE CULTURE
COLONY COUNT: NO GROWTH
Culture: NO GROWTH

## 2014-04-15 LAB — CBC WITH DIFFERENTIAL/PLATELET
Basophils Absolute: 0 10*3/uL (ref 0.0–0.1)
Basophils Relative: 0 % (ref 0–1)
Eosinophils Absolute: 0 10*3/uL (ref 0.0–0.7)
Eosinophils Relative: 0 % (ref 0–5)
HCT: 18.6 % — ABNORMAL LOW (ref 36.0–46.0)
Hemoglobin: 6.6 g/dL — CL (ref 12.0–15.0)
LYMPHS ABS: 1.3 10*3/uL (ref 0.7–4.0)
Lymphocytes Relative: 6 % — ABNORMAL LOW (ref 12–46)
MCH: 32.2 pg (ref 26.0–34.0)
MCHC: 35.5 g/dL (ref 30.0–36.0)
MCV: 90.7 fL (ref 78.0–100.0)
MONO ABS: 1.5 10*3/uL — AB (ref 0.1–1.0)
Monocytes Relative: 7 % (ref 3–12)
NEUTROS ABS: 19 10*3/uL — AB (ref 1.7–7.7)
NRBC: 1 /100{WBCs} — AB
Neutrophils Relative %: 87 % — ABNORMAL HIGH (ref 43–77)
Platelets: 342 10*3/uL (ref 150–400)
RBC: 2.05 MIL/uL — ABNORMAL LOW (ref 3.87–5.11)
RDW: 19.3 % — ABNORMAL HIGH (ref 11.5–15.5)
WBC: 21.8 10*3/uL — ABNORMAL HIGH (ref 4.0–10.5)

## 2014-04-15 LAB — LACTATE DEHYDROGENASE: LDH: 503 U/L — ABNORMAL HIGH (ref 94–250)

## 2014-04-15 LAB — MONONUCLEOSIS SCREEN: Mono Screen: NEGATIVE

## 2014-04-15 LAB — RAPID STREP SCREEN (MED CTR MEBANE ONLY): Streptococcus, Group A Screen (Direct): POSITIVE — AB

## 2014-04-15 MED ORDER — HYDROMORPHONE HCL 2 MG/ML IJ SOLN
2.0000 mg | INTRAMUSCULAR | Status: DC | PRN
  Administered 2014-04-15 – 2014-04-16 (×8): 2 mg via INTRAVENOUS
  Filled 2014-04-15 (×8): qty 1

## 2014-04-15 MED ORDER — SODIUM CHLORIDE 0.9 % IV SOLN
Freq: Once | INTRAVENOUS | Status: AC
  Administered 2014-04-15: 16:00:00 via INTRAVENOUS

## 2014-04-15 MED ORDER — AMPICILLIN SODIUM 2 G IJ SOLR
2.0000 g | Freq: Four times a day (QID) | INTRAMUSCULAR | Status: DC
  Administered 2014-04-16 – 2014-04-18 (×11): 2 g via INTRAVENOUS
  Filled 2014-04-15 (×12): qty 2000

## 2014-04-15 MED ORDER — LACTULOSE 10 GM/15ML PO SOLN
30.0000 g | Freq: Two times a day (BID) | ORAL | Status: DC
  Administered 2014-04-15 – 2014-04-18 (×2): 30 g via ORAL
  Filled 2014-04-15 (×8): qty 45

## 2014-04-15 NOTE — Progress Notes (Signed)
SICKLE CELL SERVICE PROGRESS NOTE  [name] ZOX:[id number] DOB: November 12, 1990 DOA: 04/09/2014 PCP: Soraya Paquette A., MD  Assessment/Plan: Active Problems:   Sickle cell crisis   Sickle cell anemia   Acute bacterial pharyngitis   Tonsillar exudate  1. Acute Pharyngitis with tonsillar exudate: Pt with sore throat which developed in the last 2 days.She is currently on appropriate antibiotic coverage for streptococcus. However I weill obtain a strept throat swab and monospot test. Continue current antibiotics. 2. Hb SS with crisis: Pt continues to have hemolysis elevated above her baseline. Will continue to monitor Hb. 3. Leukocytosis: Doubt Pneumonia as patient has no evidence of Hypoxemia, chest symptoms or productive cough. I will obtain a 2-view chest x-ray. To evaluate for clearing of exudates as seen on 1-view CXR. I suspect that elevated WBC is related to acute infectious process in Pharynx. 4. Anemia: Pt having acute hemolysis and also with acute infection. Will transfuse 1 Unit PRBC to get to goal of 8 g/dL of hemoglobin  5. Leukocytosis: Likely related to infection. 6. Constipation: Pt has not had a BM since admission (7 days). Will give lactulose and if no results will give a SMOG enema. 7. Medication non-compliance: Pt has clearly been non-compliant with Hydrea and has been receiving opiates from multiple sources.  Code Status: Full Code Family Communication: N/A Disposition Plan: Not yet ready for discharge  [place].  Pager [phone]. If 7PM-7AM, please contact night-coverage.  04/15/2014, 10:06 AM  LOS: 6 days    Consultants:  None  Procedures:  PICC line 04/10/2014  Antibiotics:  Levaquin 11/30>>  Vancomycin 11/30>>  Zosyn 11/30>>  HPI/Subjective: Pt reports that she had several episodes of emesis since yesterday which she reports as bloody. However nurse is aware of only one episode with was bilious and there is no documentation of  hematemesis. She continues to have pain in B/L flanks at 10/10. Last BM prior to admission.  Objective: Filed Vitals:   04/15/14 0505 04/15/14 0525 04/15/14 0600 04/15/14 0743  BP:  91/50 98/52   Pulse:  81    Temp:  98.6 F (37 C)    TempSrc:  Oral    Resp: 15 14  14   Height:      Weight:  172 lb 6.4 oz (78.2 kg)    SpO2: 95% 99%  94%   Weight change: 3.5 oz (0.1 kg)  Intake/Output Summary (Last 24 hours) at 04/15/14 1006 Last data filed at 04/15/14 0900  Gross per 24 hour  Intake    960 ml  Output      0 ml  Net    960 ml    General: Alert, awake, oriented x3, in no acute distress.  HEENT: Vian/AT PEERL, EOMI Neck: Trachea midline,  no masses, no thyromegal,y no JVD, no carotid bruit OROPHARYNX:  Moist, No exudate/ erythema/lesions.  Heart: Regular rate and rhythm, without murmurs, rubs, gallops, PMI non-displaced, no heaves or thrills on palpation.  Lungs: Clear to auscultation, no wheezing or rhonchi noted. No increased vocal fremitus resonant to percussion  Abdomen: Soft, nontender, nondistended, positive bowel sounds, no masses no hepatosplenomegaly noted..  Neuro: No focal neurological deficits noted cranial nerves II through XII grossly intact. DTRs 2+ bilaterally upper and lower extremities. Strength 5 out of 5 in bilateral upper and lower extremities. Musculoskeletal: No warm swelling or erythema around joints, no spinal tenderness noted. Psychiatric: Patient alert and oriented x3, good insight and cognition, good recent to remote recall. Lymph node survey: No cervical axillary  or inguinal lymphadenopathy noted.   Data Reviewed: Basic Metabolic Panel:  Recent Labs Lab 04/09/14 0417  04/11/14 0533 04/12/14 0409 04/13/14 0505 04/14/14 0545 04/15/14 0453  NA 136*  < > 138 136* 135* 136* 137  K 3.9  < > 3.9 3.8 3.8 3.8 4.2  CL 100  < > 102 101 100 100 98  CO2 20  < > 26 25 26 25 27   GLUCOSE 103*  < > 93 107* 114* 111* 120*  BUN 4*  < > 7 8 7  5* 8  CREATININE  0.64  < > 0.69 0.70 0.86 0.66 0.95  CALCIUM 9.4  < > 8.7 8.6 8.6 8.6 8.9  MG 2.2  --   --   --   --   --   --   < > = values in this interval not displayed. Liver Function Tests:  Recent Labs Lab 04/11/14 0533 04/12/14 0409 04/13/14 0505 04/14/14 0545 04/15/14 0453  AST 15 15 18 25  38*  ALT 12 11 13 16 23   ALKPHOS 61 72 54 58 65  BILITOT 2.8* 2.3* 1.9* 3.8* 4.6*  PROT 6.4 6.7 6.4 6.6 6.9  ALBUMIN 3.5 3.5 3.3* 3.3* 3.3*   No results for input(s): LIPASE, AMYLASE in the last 168 hours. No results for input(s): AMMONIA in the last 168 hours. CBC:  Recent Labs Lab 04/11/14 0533 04/12/14 0409 04/13/14 0505 04/14/14 0545 04/15/14 0453  WBC 13.3* 15.8* 16.1* 22.6* 21.8*  NEUTROABS 8.6* 12.3* 13.4* 19.9* 19.0*  HGB 8.3* 7.9* 7.5* 6.8* 6.6*  HCT 24.2* 22.6* 21.3* 19.2* 18.6*  MCV 94.5 94.2 94.7 91.4 90.7  PLT 370 386 363 324 342   Cardiac Enzymes: No results for input(s): CKTOTAL, CKMB, CKMBINDEX, TROPONINI in the last 168 hours. BNP (last 3 results) No results for input(s): PROBNP in the last 8760 hours. CBG:  Recent Labs Lab 04/11/14 1732 04/14/14 0212  GLUCAP 98 109*    Recent Results (from the past 240 hour(s))  Culture, blood (routine x 2)     Status: None (Preliminary result)   Collection Time: 04/14/14  3:12 AM  Result Value Ref Range Status   Specimen Description BLOOD R HAND  Final   Special Requests BOTTLES DRAWN AEROBIC ONLY 5 CC  Final   Culture  Setup Time   Final    04/14/2014 09:20 Performed at [facility]    Culture   Final           BLOOD CULTURE RECEIVED NO GROWTH TO DATE CULTURE WILL BE HELD FOR 5 DAYS BEFORE ISSUING A FINAL NEGATIVE REPORT Performed at [facility]    Report Status PENDING  Incomplete  Culture, blood (routine x 2)     Status: None (Preliminary result)   Collection Time: 04/14/14  3:15 AM  Result Value Ref Range Status   Specimen Description BLOOD R HAND  Final   Special Requests BOTTLES DRAWN AEROBIC  ONLY 5CC  Final   Culture  Setup Time   Final    04/14/2014 09:20 Performed at [facility]    Culture   Final           BLOOD CULTURE RECEIVED NO GROWTH TO DATE CULTURE WILL BE HELD FOR 5 DAYS BEFORE ISSUING A FINAL NEGATIVE REPORT Performed at [facility]    Report Status PENDING  Incomplete     Studies: Ir Fluoro Guide Cv Line Right  04/14/2014   CLINICAL DATA:  Sickle cell crisis, acute pain  crisis, no current access  EXAM: POWER PICC LINE PLACEMENT WITH ULTRASOUND AND FLUOROSCOPIC GUIDANCE  FLUOROSCOPY TIME:  12 seconds  PROCEDURE: The patient was advised of the possible risks andcomplications and agreed to undergo the procedure. The patient was then brought to the angiographic suite for the procedure.  The right arm was prepped with chlorhexidine, drapedin the usual sterile fashion using maximum barrier technique (cap and mask, sterile gown, sterile gloves, large sterile sheet, hand hygiene and cutaneous antisepsis) and infiltrated locally with 1% Lidocaine.  Ultrasound demonstrated patency of the right brachial vein, and this was documented with an image. Under real-time ultrasound guidance, this vein was accessed with a 21 gauge micropuncture needle and image documentation was performed. A 0.018 wire was introduced in to the vein. Over this, a 5 [place] double lumen power PICC was advanced to the lower SVC/right atrial junction. Fluoroscopy during the procedure and fluoro spot radiograph confirms appropriate catheter position. The catheter was flushed and covered with asterile dressing.  Catheter length: 38  Complications: None immediate  IMPRESSION: Successful right arm power PICC line placement with ultrasound and fluoroscopic guidance. The catheter is ready for use.   Electronically Signed   By: Daryll Brod M.D.   On: 04/14/2014 08:18   Ir US Guide Vasc Access Right  04/14/2014   CLINICAL DATA:  Sickle cell crisis, acute pain crisis, no current access  EXAM: POWER PICC  LINE PLACEMENT WITH ULTRASOUND AND FLUOROSCOPIC GUIDANCE  FLUOROSCOPY TIME:  12 seconds  PROCEDURE: The patient was advised of the possible risks andcomplications and agreed to undergo the procedure. The patient was then brought to the angiographic suite for the procedure.  The right arm was prepped with chlorhexidine, drapedin the usual sterile fashion using maximum barrier technique (cap and mask, sterile gown, sterile gloves, large sterile sheet, hand hygiene and cutaneous antisepsis) and infiltrated locally with 1% Lidocaine.  Ultrasound demonstrated patency of the right brachial vein, and this was documented with an image. Under real-time ultrasound guidance, this vein was accessed with a 21 gauge micropuncture needle and image documentation was performed. A 0.018 wire was introduced in to the vein. Over this, a 5 [place] double lumen power PICC was advanced to the lower SVC/right atrial junction. Fluoroscopy during the procedure and fluoro spot radiograph confirms appropriate catheter position. The catheter was flushed and covered with asterile dressing.  Catheter length: 38  Complications: None immediate  IMPRESSION: Successful right arm power PICC line placement with ultrasound and fluoroscopic guidance. The catheter is ready for use.   Electronically Signed   By: Daryll Brod M.D.   On: 04/14/2014 08:18   Dg Chest Port 1 View  04/14/2014   CLINICAL DATA:  Fever.  Sickle cell.  EXAM: PORTABLE CHEST - 1 VIEW  COMPARISON:  02/16/2014  FINDINGS: Bilateral airspace disease has developed since the prior study and may represent pneumonia. Atelectasis and fluid overload also possible. No effusion.  Cardiac enlargement.  Right arm PICC tip in the SVC  Bony changes of sickle cell.  IMPRESSION: Diffuse bilateral airspace disease may represent pneumonia or edema.   Electronically Signed   By: Franchot Gallo M.D.   On: 04/14/2014 07:39    Scheduled Meds: . enoxaparin (LOVENOX) injection  40 mg Subcutaneous Q24H   . folic acid  1 mg Oral Daily  . HYDROmorphone PCA 2 mg/mL   Intravenous 6 times per day  . hydroxyurea  1,500 mg Oral Daily  . levofloxacin (LEVAQUIN) IV  750 mg Intravenous Q24H  .  methadone  5 mg Oral QHS  . piperacillin-tazobactam (ZOSYN)  IV  3.375 g Intravenous 3 times per day  . senna-docusate  1 tablet Oral BID  . vancomycin  1,000 mg Intravenous Q8H   Continuous Infusions: . sodium chloride 100 mL/hr at 04/13/14 2238    Total time spent 40 minutes.

## 2014-04-15 NOTE — Care Management Note (Signed)
CARE MANAGEMENT NOTE 04/15/2014  Patient:  NORISSA, BARTEE   Account Number:  [id number]  Date Initiated:  04/15/2014  Documentation initiated by:  Marney Doctor  Subjective/Objective Assessment:   23 yo admitted with [facility]     Action/Plan:   From home alone   Anticipated DC Date:  04/19/2014   Anticipated DC Plan:  [place]  CM consult      Choice offered to / List presented to:             Status of service:  In process, will continue to follow Medicare Important Message given?   (If response is "NO", the following Medicare IM given date fields will be blank) Date Medicare IM given:   Medicare IM given by:   Date Additional Medicare IM given:   Additional Medicare IM given by:    Discharge Disposition:    Per UR Regulation:  Reviewed for med. necessity/level of care/duration of stay  If discussed at [place] of Stay Meetings, dates discussed:   04/15/2014    Comments:  04/15/14 Marney Doctor RN,BSN,NCM [phone] Pt also with Acute bacterial pharyngitis on abx.  Chart reviewed and CM following for DC needs.

## 2014-04-15 NOTE — Progress Notes (Signed)
ANTIBIOTIC CONSULT NOTE - INITIAL  Pharmacy Consult for Ampicillin Indication: Group A Strep pharyngitis  Allergies  Allergen Reactions  . Carrot Oil Hives and Swelling  . Latex Rash    Patient Measurements: Height: 5\' 3"  (160 cm) Weight: 172 lb 6.4 oz (78.2 kg) IBW/kg (Calculated) : 52.4  Vital Signs: Temp: 98.1 F (36.7 C) (12/01 1645) Temp Source: Oral (12/01 1645) BP: 96/44 mmHg (12/01 1645) Pulse Rate: 101 (12/01 1645) Intake/Output from previous day: 11/30 0701 - 12/01 0700 In: 600 [P.O.:600] Out: 800 [Urine:700; Emesis/NG output:100] Intake/Output from this shift: Total I/O In: 750 [P.O.:720; Blood:30] Out: 50 [Emesis/NG output:50]  Labs:  Recent Labs  04/13/14 0505 04/14/14 0545 04/15/14 0453  WBC 16.1* 22.6* 21.8*  HGB 7.5* 6.8* 6.6*  PLT 363 324 342  CREATININE 0.86 0.66 0.95   Estimated Creatinine Clearance: 91.9 mL/min (by C-G formula based on Cr of 0.95). No results for input(s): VANCOTROUGH, VANCOPEAK, VANCORANDOM, GENTTROUGH, GENTPEAK, GENTRANDOM, TOBRATROUGH, TOBRAPEAK, TOBRARND, AMIKACINPEAK, AMIKACINTROU, AMIKACIN in the last 72 hours.   Microbiology: Recent Results (from the past 720 hour(s))  Culture, Urine     Status: None   Collection Time: 04/14/14  2:28 AM  Result Value Ref Range Status   Specimen Description URINE, CLEAN CATCH  Final   Special Requests NONE  Final   Culture  Setup Time   Final    04/14/2014 08:59 Performed at [place] Performed at [facility]   Final   Culture NO GROWTH Performed at [facility]   Final   Report Status 04/15/2014 FINAL  Final  Culture, blood (routine x 2)     Status: None (Preliminary result)   Collection Time: 04/14/14  3:12 AM  Result Value Ref Range Status   Specimen Description BLOOD R HAND  Final   Special Requests BOTTLES DRAWN AEROBIC ONLY 5 CC  Final   Culture  Setup Time   Final    04/14/2014 09:20 Performed at [facility]    Culture   Final           BLOOD CULTURE RECEIVED NO GROWTH TO DATE CULTURE WILL BE HELD FOR 5 DAYS BEFORE ISSUING A FINAL NEGATIVE REPORT Performed at [facility]    Report Status PENDING  Incomplete  Culture, blood (routine x 2)     Status: None (Preliminary result)   Collection Time: 04/14/14  3:15 AM  Result Value Ref Range Status   Specimen Description BLOOD R HAND  Final   Special Requests BOTTLES DRAWN AEROBIC ONLY 5CC  Final   Culture  Setup Time   Final    04/14/2014 09:20 Performed at [facility]    Culture   Final           BLOOD CULTURE RECEIVED NO GROWTH TO DATE CULTURE WILL BE HELD FOR 5 DAYS BEFORE ISSUING A FINAL NEGATIVE REPORT Performed at [facility]    Report Status PENDING  Incomplete  Rapid strep screen     Status: Abnormal   Collection Time: 04/15/14 11:15 AM  Result Value Ref Range Status   Streptococcus, Group A Screen (Direct) POSITIVE (A) NEGATIVE Final    Medical History: Past Medical History  Diagnosis Date  . H/O: 1 miscarriage 03/22/2011    Pt reports 2 miscarriages.  [name] 01/08/2009  . Depression 01/06/2011  . GERD (gastroesophageal reflux disease) 02/17/2011  . Trichotillomania     h/o  . Blood  transfusion     "lots"  . Sickle cell anemia with crisis   . Exertional dyspnea     "sometimes"  . Sickle cell anemia   . Headache(784.0)   . Migraines 11/08/11    "@ least twice/month"  . Chronic back pain     "very severe; have knot in my back; from tight muscle; take RX and exercise for it"  . Mood swings 11/08/11    "I go back and forth; real bad"  . Pregnancy   . Blood dyscrasia     SICKLE CELL    Assessment: 54 yoF admitted with Sickle cell crisis. Patient now has SOB, positive CXR for bilateral infiltrates and fever. Pharmacy consulted to dose Zosyn and Vancomycin for pneumonia. Rapid strep test = + Group A strep.  Chest Xray shows no acute infiltrate, so MD has d/c'ed Levaquin, Vanc  and Zosyn and requested Pharmacy to dose Ampicillin  11/30 >>Zosyn>> 12/1 11/30 >>Vanco>> 12/1 11/30 >> Levaquin >> 12/1  Tmax: 102.2 WBCs: elevated Renal: SCr 0.66 CrCl > 100  11/30 blood: NGTD 11/30 urine : NGF 12/1 : Rapid strep screen = +  Goal of Therapy:  Appropriate antibiotic dosing for renal function; eradication of infection  Plan:  Ampicillin 2gm IV q6h  Leone Haven, PharmD 04/15/2014 6:34 PM

## 2014-04-15 NOTE — Progress Notes (Addendum)
Patient ID: [name], female   DOB: 1990/07/31, 23 y.o.   MRN: [id number] Rapid strept test positive for GAS. Will discontinue Vancomycin and Levaquin. Continue Zosyn for now. If CXR shows no evidence of Pneumonia will change to ampicillin for a more narrow antibiotic  Spectrum. MATTHEWS,MICHELLE A.  6:15 pm Reviewed CXR and no evidence of acute infiltrate which is inconsistent with pneumonia. Will change antibiotic from Zosyn to Ampicillin. MATTHEWS,MICHELLE A.  Marland Kitchen

## 2014-04-16 ENCOUNTER — Inpatient Hospital Stay (HOSPITAL_COMMUNITY): Payer: Medicare Other

## 2014-04-16 DIAGNOSIS — A400 Sepsis due to streptococcus, group A: Secondary | ICD-10-CM

## 2014-04-16 LAB — CBC WITH DIFFERENTIAL/PLATELET
BASOS PCT: 0 % (ref 0–1)
Band Neutrophils: 0 % (ref 0–10)
Basophils Absolute: 0 10*3/uL (ref 0.0–0.1)
Blasts: 0 %
Eosinophils Absolute: 0 10*3/uL (ref 0.0–0.7)
Eosinophils Relative: 0 % (ref 0–5)
HCT: 21.2 % — ABNORMAL LOW (ref 36.0–46.0)
HEMOGLOBIN: 7.4 g/dL — AB (ref 12.0–15.0)
LYMPHS ABS: 2.2 10*3/uL (ref 0.7–4.0)
LYMPHS PCT: 12 % (ref 12–46)
MCH: 31.6 pg (ref 26.0–34.0)
MCHC: 34.9 g/dL (ref 30.0–36.0)
MCV: 90.6 fL (ref 78.0–100.0)
MONOS PCT: 10 % (ref 3–12)
Metamyelocytes Relative: 0 %
Monocytes Absolute: 1.9 10*3/uL — ABNORMAL HIGH (ref 0.1–1.0)
Myelocytes: 0 %
NEUTROS ABS: 14.4 10*3/uL — AB (ref 1.7–7.7)
NEUTROS PCT: 78 % — AB (ref 43–77)
PROMYELOCYTES ABS: 0 %
Platelets: 375 10*3/uL (ref 150–400)
RBC: 2.34 MIL/uL — AB (ref 3.87–5.11)
RDW: 18.9 % — ABNORMAL HIGH (ref 11.5–15.5)
WBC: 18.5 10*3/uL — AB (ref 4.0–10.5)
nRBC: 4 /100 WBC — ABNORMAL HIGH

## 2014-04-16 LAB — TYPE AND SCREEN
ABO/RH(D): B POS
ANTIBODY SCREEN: NEGATIVE
DONOR AG TYPE: NEGATIVE
Unit division: 0

## 2014-04-16 LAB — LACTATE DEHYDROGENASE: LDH: 444 U/L — ABNORMAL HIGH (ref 94–250)

## 2014-04-16 MED ORDER — HYDROMORPHONE HCL 1 MG/ML IJ SOLN
1.0000 mg | INTRAMUSCULAR | Status: DC | PRN
  Administered 2014-04-16 – 2014-04-18 (×9): 1 mg via INTRAVENOUS
  Filled 2014-04-16 (×9): qty 1

## 2014-04-16 NOTE — Progress Notes (Signed)
SICKLE CELL SERVICE PROGRESS NOTE  [name] YIF:[id number] DOB: 05/09/1991 DOA: 04/09/2014 PCP: Nancy Cooter A., MD  Assessment/Plan: Active Problems:   Sickle cell crisis   Sickle cell anemia   Acute bacterial pharyngitis   Tonsillar exudate  1. Acute Group A Strept Pharyngitis with tonsillar exudate: She is complaining of difficulty swallowing and pain in the lateral neck. I will obtain an x-ray of  Neck soft tissue to r/o neck abscess or epiglottitis. She is currently on 3/10 of antibiotics. Will give IM Bicillin at discharge. 2. Hb SS with crisis: Pt has used 38.1 mg on PCA with 36/38 Dmands/Deliveries. Will continue PCA and decrease dose of clinician assisted dilaudid. Plan on weaning PCA and transitioning to oral analgesics in the next 24 hours. 3. Leukocytosis: Doubt Pneumonia as patient has no evidence of Hypoxemia, chest symptoms or productive cough. I will obtain a 2-view chest x-ray. To evaluate for clearing of exudates as seen on 1-view CXR. I suspect that elevated WBC is related to acute infectious process in Pharynx. 4. Abdominal wall painful mass: Will obtain U/S to r/o abscess. 5. Anemia: Pt having acute hemolysis and also with acute infection.  6. Leukocytosis: Likely related to infection. 7. Constipation: Pt has not had a BM since admission (8 days) despite lactulose. Will give SMOG enema. 8. Medication non-compliance: Pt has clearly been non-compliant with Hydrea and has been receiving opiates from multiple sources.  Code Status: Full Code Family Communication: N/A Disposition Plan: Plan to discharge when no further fever and signs of sepsis (tachycardia, Hypotension)  [name] A.  Pager [phone]. If 7PM-7AM, please contact night-coverage.  04/16/2014, 2:57 PM  LOS: 7 days    Consultants:  None  Procedures:  PICC line 04/10/2014  Antibiotics:  Levaquin 11/30>>12/1  Vancomycin 11/30>> 12/1  Zosyn 11/30>> 12/1  Ampicillin 12/1  >>  HPI/Subjective: Pt reports that she is still feeling poorly. I have explained that she will likely have body aches the strept infection. Pt complains of pain in lateral neck on right and has some fullness in the neck.  Objective: Filed Vitals:   04/16/14 0433 04/16/14 0828 04/16/14 1041 04/16/14 1217  BP: 94/60  94/59   Pulse: 78  90   Temp: 97.8 F (36.6 C)  98 F (36.7 C)   TempSrc: Oral  Oral   Resp: 18 9 14 12   Height:      Weight: 170 lb 10.2 oz (77.4 kg)     SpO2: 97%  98% 98%   Weight change: -1 lb 12.2 oz (-0.8 kg)  Intake/Output Summary (Last 24 hours) at 04/16/14 1457 Last data filed at 04/15/14 1840  Gross per 24 hour  Intake    335 ml  Output      0 ml  Net    335 ml    General: Alert, awake, oriented x3, in no acute distress.  HEENT: [place]/AT PEERL, EOMI Neck: Trachea midline,  no masses, no thyromegal,y no JVD, no carotid bruit OROPHARYNX:  Moist, No exudate/ erythema/lesions.  Heart: Regular rate and rhythm, without murmurs, rubs, gallops, PMI non-displaced, no heaves or thrills on palpation.  Lungs: Clear to auscultation, no wheezing or rhonchi noted. No increased vocal fremitus resonant to percussion  Abdomen: Soft, nontender, nondistended, positive bowel sounds, no masses no hepatosplenomegaly noted..  Neuro: No focal neurological deficits noted cranial nerves II through XII grossly intact.  Strength normal in bilateral upper and lower extremities. Musculoskeletal: No warm swelling or erythema around joints, no spinal tenderness noted. Psychiatric:  Patient alert and oriented x3, good insight and cognition, good recent to remote recall. Lymph node survey: No cervical axillary or inguinal lymphadenopathy noted.   Data Reviewed: Basic Metabolic Panel:  Recent Labs Lab 04/11/14 0533 04/12/14 0409 04/13/14 0505 04/14/14 0545 04/15/14 0453  NA 138 136* 135* 136* 137  K 3.9 3.8 3.8 3.8 4.2  CL 102 101 100 100 98  CO2 26 25 26 25 27   GLUCOSE 93 107*  114* 111* 120*  BUN 7 8 7  5* 8  CREATININE 0.69 0.70 0.86 0.66 0.95  CALCIUM 8.7 8.6 8.6 8.6 8.9   Liver Function Tests:  Recent Labs Lab 04/11/14 0533 04/12/14 0409 04/13/14 0505 04/14/14 0545 04/15/14 0453  AST 15 15 18 25  38*  ALT 12 11 13 16 23   ALKPHOS 61 72 54 58 65  BILITOT 2.8* 2.3* 1.9* 3.8* 4.6*  PROT 6.4 6.7 6.4 6.6 6.9  ALBUMIN 3.5 3.5 3.3* 3.3* 3.3*   No results for input(s): LIPASE, AMYLASE in the last 168 hours. No results for input(s): AMMONIA in the last 168 hours. CBC:  Recent Labs Lab 04/12/14 0409 04/13/14 0505 04/14/14 0545 04/15/14 0453 04/16/14 0515  WBC 15.8* 16.1* 22.6* 21.8* 18.5*  NEUTROABS 12.3* 13.4* 19.9* 19.0* 14.4*  HGB 7.9* 7.5* 6.8* 6.6* 7.4*  HCT 22.6* 21.3* 19.2* 18.6* 21.2*  MCV 94.2 94.7 91.4 90.7 90.6  PLT 386 363 324 342 375   Cardiac Enzymes: No results for input(s): CKTOTAL, CKMB, CKMBINDEX, TROPONINI in the last 168 hours. BNP (last 3 results) No results for input(s): PROBNP in the last 8760 hours. CBG:  Recent Labs Lab 04/11/14 1732 04/14/14 0212  GLUCAP 98 109*    Recent Results (from the past 240 hour(s))  Culture, Urine     Status: None   Collection Time: 04/14/14  2:28 AM  Result Value Ref Range Status   Specimen Description URINE, CLEAN CATCH  Final   Special Requests NONE  Final   Culture  Setup Time   Final    04/14/2014 08:59 Performed at [place] Performed at [facility]   Final   Culture NO GROWTH Performed at [facility]   Final   Report Status 04/15/2014 FINAL  Final  Culture, blood (routine x 2)     Status: None (Preliminary result)   Collection Time: 04/14/14  3:12 AM  Result Value Ref Range Status   Specimen Description BLOOD R HAND  Final   Special Requests BOTTLES DRAWN AEROBIC ONLY 5 CC  Final   Culture  Setup Time   Final    04/14/2014 09:20 Performed at [facility]    Culture   Final           BLOOD CULTURE  RECEIVED NO GROWTH TO DATE CULTURE WILL BE HELD FOR 5 DAYS BEFORE ISSUING A FINAL NEGATIVE REPORT Performed at [facility]    Report Status PENDING  Incomplete  Culture, blood (routine x 2)     Status: None (Preliminary result)   Collection Time: 04/14/14  3:15 AM  Result Value Ref Range Status   Specimen Description BLOOD R HAND  Final   Special Requests BOTTLES DRAWN AEROBIC ONLY 5CC  Final   Culture  Setup Time   Final    04/14/2014 09:20 Performed at [facility]    Culture   Final           BLOOD CULTURE RECEIVED NO GROWTH TO  DATE CULTURE WILL BE HELD FOR 5 DAYS BEFORE ISSUING A FINAL NEGATIVE REPORT Performed at [facility]    Report Status PENDING  Incomplete  Rapid strep screen     Status: Abnormal   Collection Time: 04/15/14 11:15 AM  Result Value Ref Range Status   Streptococcus, Group A Screen (Direct) POSITIVE (A) NEGATIVE Final     Studies: Dg Chest 2 View  04/15/2014   CLINICAL DATA:  Cough, fever, history of pneumonia and strep throat, sickle cell disease  EXAM: CHEST  2 VIEW  COMPARISON:  04/14/2014  FINDINGS: RIGHT arm PICC line with tip projecting over mid SVC.  Enlargement of cardiac silhouette with pulmonary vascular congestion.  RIGHT upper lobe volume loss.  No gross infiltrate, pleural effusion, or pneumothorax.  Bones unremarkable.  IMPRESSION: Enlargement of cardiac silhouette with pulmonary vascular congestion consistent with history of sickle cell disease.  No acute infiltrate.   Electronically Signed   By: Lavonia Dana M.D.   On: 04/15/2014 15:25   Ir Fluoro Guide Cv Line Right  04/14/2014   CLINICAL DATA:  Sickle cell crisis, acute pain crisis, no current access  EXAM: POWER PICC LINE PLACEMENT WITH ULTRASOUND AND FLUOROSCOPIC GUIDANCE  FLUOROSCOPY TIME:  12 seconds  PROCEDURE: The patient was advised of the possible risks andcomplications and agreed to undergo the procedure. The patient was then brought to the angiographic suite for  the procedure.  The right arm was prepped with chlorhexidine, drapedin the usual sterile fashion using maximum barrier technique (cap and mask, sterile gown, sterile gloves, large sterile sheet, hand hygiene and cutaneous antisepsis) and infiltrated locally with 1% Lidocaine.  Ultrasound demonstrated patency of the right brachial vein, and this was documented with an image. Under real-time ultrasound guidance, this vein was accessed with a 21 gauge micropuncture needle and image documentation was performed. A 0.018 wire was introduced in to the vein. Over this, a 5 [place] double lumen power PICC was advanced to the lower SVC/right atrial junction. Fluoroscopy during the procedure and fluoro spot radiograph confirms appropriate catheter position. The catheter was flushed and covered with asterile dressing.  Catheter length: 38  Complications: None immediate  IMPRESSION: Successful right arm power PICC line placement with ultrasound and fluoroscopic guidance. The catheter is ready for use.   Electronically Signed   By: Daryll Brod M.D.   On: 04/14/2014 08:18   Ir US Guide Vasc Access Right  04/14/2014   CLINICAL DATA:  Sickle cell crisis, acute pain crisis, no current access  EXAM: POWER PICC LINE PLACEMENT WITH ULTRASOUND AND FLUOROSCOPIC GUIDANCE  FLUOROSCOPY TIME:  12 seconds  PROCEDURE: The patient was advised of the possible risks andcomplications and agreed to undergo the procedure. The patient was then brought to the angiographic suite for the procedure.  The right arm was prepped with chlorhexidine, drapedin the usual sterile fashion using maximum barrier technique (cap and mask, sterile gown, sterile gloves, large sterile sheet, hand hygiene and cutaneous antisepsis) and infiltrated locally with 1% Lidocaine.  Ultrasound demonstrated patency of the right brachial vein, and this was documented with an image. Under real-time ultrasound guidance, this vein was accessed with a 21 gauge micropuncture needle  and image documentation was performed. A 0.018 wire was introduced in to the vein. Over this, a 5 [place] double lumen power PICC was advanced to the lower SVC/right atrial junction. Fluoroscopy during the procedure and fluoro spot radiograph confirms appropriate catheter position. The catheter was flushed and covered with asterile dressing.  Catheter length: 38  Complications: None immediate  IMPRESSION: Successful right arm power PICC line placement with ultrasound and fluoroscopic guidance. The catheter is ready for use.   Electronically Signed   By: Daryll Brod M.D.   On: 04/14/2014 08:18   Dg Chest Port 1 View  04/14/2014   CLINICAL DATA:  Fever.  Sickle cell.  EXAM: PORTABLE CHEST - 1 VIEW  COMPARISON:  02/16/2014  FINDINGS: Bilateral airspace disease has developed since the prior study and may represent pneumonia. Atelectasis and fluid overload also possible. No effusion.  Cardiac enlargement.  Right arm PICC tip in the SVC  Bony changes of sickle cell.  IMPRESSION: Diffuse bilateral airspace disease may represent pneumonia or edema.   Electronically Signed   By: Franchot Gallo M.D.   On: 04/14/2014 07:39    Scheduled Meds: . ampicillin (OMNIPEN) IV  2 g Intravenous 4 times per day  . enoxaparin (LOVENOX) injection  40 mg Subcutaneous Q24H  . folic acid  1 mg Oral Daily  . HYDROmorphone PCA 2 mg/mL   Intravenous 6 times per day  . hydroxyurea  1,500 mg Oral Daily  . lactulose  30 g Oral BID  . methadone  5 mg Oral QHS  . senna-docusate  1 tablet Oral BID   Continuous Infusions: . sodium chloride 100 mL/hr at 04/16/14 1224    Total time spent 40 minutes.

## 2014-04-17 ENCOUNTER — Ambulatory Visit: Payer: Medicare Other | Admitting: Internal Medicine

## 2014-04-17 LAB — DIFFERENTIAL
BASOS PCT: 1 % (ref 0–1)
Basophils Absolute: 0.2 10*3/uL — ABNORMAL HIGH (ref 0.0–0.1)
Eosinophils Absolute: 0.2 10*3/uL (ref 0.0–0.7)
Eosinophils Relative: 1 % (ref 0–5)
LYMPHS ABS: 1.6 10*3/uL (ref 0.7–4.0)
Lymphocytes Relative: 10 % — ABNORMAL LOW (ref 12–46)
Monocytes Absolute: 1.6 10*3/uL — ABNORMAL HIGH (ref 0.1–1.0)
Monocytes Relative: 10 % (ref 3–12)
Neutro Abs: 11.9 10*3/uL — ABNORMAL HIGH (ref 1.7–7.7)
Neutrophils Relative %: 78 % — ABNORMAL HIGH (ref 43–77)
nRBC: 11 /100 WBC — ABNORMAL HIGH

## 2014-04-17 LAB — BASIC METABOLIC PANEL
Anion gap: 13 (ref 5–15)
BUN: 5 mg/dL — ABNORMAL LOW (ref 6–23)
CO2: 27 meq/L (ref 19–32)
Calcium: 8.9 mg/dL (ref 8.4–10.5)
Chloride: 101 mEq/L (ref 96–112)
Creatinine, Ser: 0.98 mg/dL (ref 0.50–1.10)
GFR calc Af Amer: >90 mL/min (ref >90)
GFR calc non Af Amer: 81 mL/min — ABNORMAL LOW (ref >90)
GLUCOSE: 84 mg/dL (ref 70–99)
Potassium: 3.8 mEq/L (ref 3.7–5.3)
Sodium: 141 mEq/L (ref 137–147)

## 2014-04-17 LAB — CBC
HCT: 21 % — ABNORMAL LOW (ref 36.0–46.0)
Hemoglobin: 7.4 g/dL — ABNORMAL LOW (ref 12.0–15.0)
MCH: 31.8 pg (ref 26.0–34.0)
MCHC: 35.2 g/dL (ref 30.0–36.0)
MCV: 90.1 fL (ref 78.0–100.0)
Platelets: 461 10*3/uL — ABNORMAL HIGH (ref 150–400)
RBC: 2.33 MIL/uL — AB (ref 3.87–5.11)
RDW: 18.6 % — ABNORMAL HIGH (ref 11.5–15.5)
WBC: 15.5 10*3/uL — ABNORMAL HIGH (ref 4.0–10.5)

## 2014-04-17 LAB — LACTATE DEHYDROGENASE: LDH: 540 U/L — ABNORMAL HIGH (ref 94–250)

## 2014-04-17 MED ORDER — ALUM & MAG HYDROXIDE-SIMETH 200-200-20 MG/5ML PO SUSP
30.0000 mL | Freq: Four times a day (QID) | ORAL | Status: DC | PRN
  Administered 2014-04-17: 30 mL via ORAL
  Filled 2014-04-17: qty 30

## 2014-04-17 NOTE — Progress Notes (Signed)
Subjective: Patient is feeling better today but still in pain at 6/10. She has acute pharyngitis which is responding to antibiotics. No SOB, no cough, no fever. Has been able to eat and drink without problems. Still having problems with BM. She thinks she will be ready for DC in am as she will walk today to strengthen her feet.  Objective: Vital signs in last 24 hours: Temp:  [98.1 F (36.7 C)-98.4 F (36.9 C)] 98.1 F (36.7 C) (12/03 0925) Pulse Rate:  [82-90] 84 (12/03 0925) Resp:  [9-18] 16 (12/03 0925) BP: (97-102)/(42-62) 102/62 mmHg (12/03 0925) SpO2:  [93 %-98 %] 97 % (12/03 0925) Weight change:  Last BM Date: 04/16/14  Intake/Output from previous day: 12/02 0701 - 12/03 0700 In: 1040 [P.O.:240; I.V.:800] Out: 2000 [Urine:2000] Intake/Output this shift:    General appearance: alert, cooperative and mild distress Eyes: conjunctivae/corneas clear. PERRL, EOM's intact. Fundi benign. Throat: lips, mucosa, and tongue normal; teeth and gums normal Neck: no adenopathy, no carotid bruit, no JVD, supple, symmetrical, trachea midline and thyroid not enlarged, symmetric, no tenderness/mass/nodules Back: symmetric, no curvature. ROM normal. No CVA tenderness. Resp: clear to auscultation bilaterally Chest wall: no tenderness Cardio: regular rate and rhythm, S1, S2 normal, no murmur, click, rub or gallop GI: soft, non-tender; bowel sounds normal; no masses,  no organomegaly Extremities: extremities normal, atraumatic, no cyanosis or edema Pulses: 2+ and symmetric Skin: Skin color, texture, turgor normal. No rashes or lesions Neurologic: Grossly normal  Lab Results:  Recent Labs  04/16/14 0515 04/17/14 0545  WBC 18.5* 15.5*  HGB 7.4* 7.4*  HCT 21.2* 21.0*  PLT 375 461*   BMET  Recent Labs  04/15/14 0453 04/17/14 0545  NA 137 141  K 4.2 3.8  CL 98 101  CO2 27 27  GLUCOSE 120* 84  BUN 8 5*  CREATININE 0.95 0.98  CALCIUM 8.9 8.9    Studies/Results: Dg Neck Soft  Tissue  04/16/2014   CLINICAL DATA:  Stridor.  Group a strep.  Initial encounter.  EXAM: NECK SOFT TISSUES - 1+ VIEW  COMPARISON:  None.  FINDINGS: Levoconvex torticollis is present. The prevertebral soft tissues appear within normal limits. The epiglottis is normal.  IMPRESSION: 1. Normal epiglottis. 2. Mild levoconvex torticollis.   Electronically Signed   By: Dereck Ligas M.D.   On: 04/16/2014 14:58   Dg Chest 2 View  04/15/2014   CLINICAL DATA:  Cough, fever, history of pneumonia and strep throat, sickle cell disease  EXAM: CHEST  2 VIEW  COMPARISON:  04/14/2014  FINDINGS: RIGHT arm PICC line with tip projecting over mid SVC.  Enlargement of cardiac silhouette with pulmonary vascular congestion.  RIGHT upper lobe volume loss.  No gross infiltrate, pleural effusion, or pneumothorax.  Bones unremarkable.  IMPRESSION: Enlargement of cardiac silhouette with pulmonary vascular congestion consistent with history of sickle cell disease.  No acute infiltrate.   Electronically Signed   By: Lavonia Dana M.D.   On: 04/15/2014 15:25   US Abdomen Limited  04/16/2014   CLINICAL DATA:  Abdominal wall abscess. Sickle cell crisis. Abdominal wall pain.  EXAM: LIMITED ABDOMINAL ULTRASOUND  COMPARISON:  None.  FINDINGS: Scanning was performed over the area of pain. There was no abscess. No subcutaneous edema was identified. The abdominal wall appears normal.  IMPRESSION: Negative.   Electronically Signed   By: Dereck Ligas M.D.   On: 04/16/2014 14:44    Medications: I have reviewed the patient's current medications.  Assessment/Plan: A 23 yo  woman admitted with Sickle cell Painful crisis.  #1 Acute Pharyngitis: patient is responding to antibiotics and supportive care. Will treat for 7 days with antibiotics..  #2.Sickle Cell Painful Crisis: Continue with Dilaudid PCA and begin titration in preparation for DC tomorrow.  #3 Sickle Cell anemia: S/P hemolytic anemia. Now stable H/H.   #4 Leucocytosis:Improving.  Continue to monitor.  #4 Hyponatremia: Resolved.  #5 Thrombocythemia: Improved   #6 Constipation:Resolved    LOS: 8 days   Tabia Landowski,LAWAL 04/17/2014, 11:03 AM

## 2014-04-17 NOTE — Plan of Care (Signed)
Problem: Phase I Progression Outcomes Goal: Voiding Quantity Sufficient Outcome: Completed/Met Date Met:  04/17/14 Goal: Hemodynamically stable Outcome: Completed/Met Date Met:  04/17/14

## 2014-04-18 LAB — COMPREHENSIVE METABOLIC PANEL
ALBUMIN: 3 g/dL — AB (ref 3.5–5.2)
ALK PHOS: 63 U/L (ref 39–117)
ALT: 14 U/L (ref 0–35)
AST: 17 U/L (ref 0–37)
Anion gap: 13 (ref 5–15)
BUN: 5 mg/dL — ABNORMAL LOW (ref 6–23)
CO2: 28 mEq/L (ref 19–32)
Calcium: 8.6 mg/dL (ref 8.4–10.5)
Chloride: 96 mEq/L (ref 96–112)
Creatinine, Ser: 0.96 mg/dL (ref 0.50–1.10)
GFR calc Af Amer: >90 mL/min (ref >90)
GFR calc non Af Amer: 83 mL/min — ABNORMAL LOW (ref >90)
Glucose, Bld: 95 mg/dL (ref 70–99)
POTASSIUM: 3 meq/L — AB (ref 3.7–5.3)
Sodium: 137 mEq/L (ref 137–147)
Total Bilirubin: 1.6 mg/dL — ABNORMAL HIGH (ref 0.3–1.2)
Total Protein: 6.6 g/dL (ref 6.0–8.3)

## 2014-04-18 LAB — CBC WITH DIFFERENTIAL/PLATELET
BASOS ABS: 0 10*3/uL (ref 0.0–0.1)
Basophils Relative: 0 % (ref 0–1)
EOS PCT: 1 % (ref 0–5)
Eosinophils Absolute: 0.2 10*3/uL (ref 0.0–0.7)
HEMATOCRIT: 22.1 % — AB (ref 36.0–46.0)
Hemoglobin: 7.7 g/dL — ABNORMAL LOW (ref 12.0–15.0)
Lymphocytes Relative: 13 % (ref 12–46)
Lymphs Abs: 2.2 10*3/uL (ref 0.7–4.0)
MCH: 31.8 pg (ref 26.0–34.0)
MCHC: 34.8 g/dL (ref 30.0–36.0)
MCV: 91.3 fL (ref 78.0–100.0)
MONOS PCT: 10 % (ref 3–12)
Monocytes Absolute: 1.7 10*3/uL — ABNORMAL HIGH (ref 0.1–1.0)
NRBC: 13 /100{WBCs} — AB
Neutro Abs: 12.6 10*3/uL — ABNORMAL HIGH (ref 1.7–7.7)
Neutrophils Relative %: 76 % (ref 43–77)
PLATELETS: 525 10*3/uL — AB (ref 150–400)
RBC: 2.42 MIL/uL — AB (ref 3.87–5.11)
RDW: 18.5 % — ABNORMAL HIGH (ref 11.5–15.5)
WBC: 16.7 10*3/uL — AB (ref 4.0–10.5)

## 2014-04-18 MED ORDER — METHADONE HCL 5 MG PO TABS: 5.0000 mg | ORAL_TABLET | Freq: Every day | ORAL | Status: DC

## 2014-04-18 MED ORDER — OXYCODONE-ACETAMINOPHEN 10-325 MG PO TABS: 1.0000 | ORAL_TABLET | ORAL | Status: DC | PRN

## 2014-04-18 MED ORDER — PENICILLIN G BENZATHINE 1200000 UNIT/2ML IM SUSP
2.4000 10*6.[IU] | Freq: Once | INTRAMUSCULAR | Status: AC
Start: 2014-04-18 — End: 2014-04-18
  Administered 2014-04-18: 2.4 10*6.[IU] via INTRAMUSCULAR
  Filled 2014-04-18: qty 4

## 2014-04-18 NOTE — Progress Notes (Signed)
Per MD order, PICC line removed. Arrived to find the PICC line to have no dressing on it. Pt states she took a shower and pulled it off due to itching. Advised she has set herself up for infection states she understands. Cath intact at 38cm. Vaseline pressure gauze to site, pressure held x 64min. No bleeding to site. Pt instructed to keep dressing CDI x 24 hours. Avoid heavy lifting, pushing or pulling x 24 hours,  If bleeding occurs hold pressure, if bleeding does not stop contact MD or go to the ED. Pt does not have any questions. Catalina Pizza

## 2014-04-18 NOTE — Progress Notes (Signed)
Wasted 40 mg Dilaudid PCA in sink.  Witnessed by Reyne Dumas, RN.

## 2014-04-18 NOTE — Progress Notes (Signed)
ANTIBIOTIC CONSULT NOTE - Follow up  Pharmacy Consult for Ampicillin Indication: Group A Strep pharyngitis  Allergies  Allergen Reactions  . Carrot Oil Hives and Swelling  . Latex Rash    Patient Measurements: Height: 5\' 3"  (160 cm) Weight: 170 lb 10.2 oz (77.4 kg) IBW/kg (Calculated) : 52.4  Assessment: 23 yoF admitted with Sickle cell crisis. Patient now has SOB, positive CXR for bilateral infiltrates and fever. Pharmacy originally consulted to dose Zosyn and Vancomycin for pneumonia. Antibiotics de-escalated 12/1 to ampicillin per Pharmacy for continued treatment of acute pharyngitis, as MD's suspicion for pneumonia is low.  Antiinfectives 11/30 >> Zosyn>>12/1 11/30 >> Vancomycin>> 12/1 11/30 >> Levaquin >> 12/1 12/1 >> Ampicillin >>  Labs / vitals Tmax: now improved to afebrile WBCs: remains elevated Renal: SCr 0.96- fairly stable (baseline 0.7), CrCl 90CG, >100N  Microbiology 11/30 blood: ngtd 11/30 urine: NGF 12/1 rapid strep screen: POSITIVE (Group A Strep)  Goal of Therapy:  Appropriate antibiotic dosing for renal function; eradication of infection  Plan:  - continue ampicillin 2gm IV q6h - follow-up clinical course, culture results, renal function - noted patient to receive IM Bicillin at discharge due to concerns of non-compliance to needed antibiotic therapy  Thank you for the consult.  Currie Paris, PharmD, BCPS Pager: [phone] Pharmacy: [phone] 04/18/2014 10:33 AM

## 2014-04-18 NOTE — Progress Notes (Signed)
Discharge instructions reviewed with patient by Dr. Alben Deeds.  Patient demonstrated understanding.  Patient's PICC line was pulled by IV team.  Patient would not agree to stay for the 30 minute post removal despite education about potential adverse effects.  Site visualized and dressing remains C/D/I.  Patient discharged via wheelchair.  Otherwise stable for discharge.  Zandra Abts [facility]  04/18/2014  6:40 PM

## 2014-04-18 NOTE — Discharge Summary (Signed)
Physician Discharge Summary  [name] DHR:[id number] DOB: 1991-04-02 DOA: 04/09/2014  PCP: MATTHEWS,MICHELLE A., MD  Admit date: 04/09/2014 Discharge date: 04/18/2014  Discharge Diagnoses:  Active Problems:   Sickle cell crisis   Sickle cell anemia   Acute bacterial pharyngitis   Tonsillar exudate   Discharge Condition: Stable  Disposition:      Follow-up Information    Follow up with MATTHEWS,MICHELLE A., MD In 6 days.   Specialty:  Internal Medicine   Contact information:   [place] [id number] [phone]       Diet:regular  Wt Readings from Last 3 Encounters:  04/16/14 170 lb 10.2 oz (77.4 kg)  02/22/14 174 lb 9.6 oz (79.198 kg)  01/31/14 178 lb 1.6 oz (80.786 kg)    History of present illness:  23 year old female with past medical history of sickle cell Hb-SS disease, previous recent admission in early October for sickle cell pain crisis who presented to [facility] ED 04/09/2014 with worsening pain in the lower back and upper extremities more pronounced in left arm. Pain is 10/10 in intensity and not relieved with home analgesics, methadone and oxycodone. Patient ha no complaints of chest pain, shortness of breath or palpitations. No reports of fevers or chills. NO reports of urinary frequency, urgency or dysuria. No diarrhea or constipation.  In ED, vitals are stable: BP 127/74, HR 87-109, RR 15, T max 98.9 F and oxygen saturation 99% on room air. Blood work showed hemoglobin of 10.2, platelets of 467, sodium 136, normal renal function. Patient has received total of 5 mg IV dilaudid and pain still not adequately controlled. TRH asked to admit for pain management.   Hospital Course:  Sickle Cell Crisis: Patient presented with pain characteristic of acute vaso-occlusive crisis.Pt's pain was treated with bolus IV analgesics initially and was later transitioned with a Dilaudid PCA and ketorolac. She was continued on her home dose of Methadone. PCA  was decreased as pain improved and she was started on her home dose of Oxycodone. Her pain was well controlled with her oral home regimen without much need for PCA. She had overall improvement of her pain and was physically functional upon discharge. Her Hgb remained around her baseline during her stay. Pt relayed that she had no more pain meds, so she was discharged with 6 days worth of Percocet and Methadone for home pain management until she can f/u with PCP.  GAS Pharyngitis: Pt spiked a fever to 103F during stay and was initially treated for HCAP with vancomycin, Zosyn, and Levaquin. Her symptoms were more localized to her throat, so antibiotics were de-escalated to Ampicillin and a throat swab performed was positive for group A strep. In addition neck soft tissue x-ray was negative for abscess or epiglottitis. She was continued on Ampicillin until discharge. Prior to discharge she was given an IM dose of 2.4 mil units of Bicillin to complete her treatment due to her history of non-compliance.   Discharge Exam:  Filed Vitals:   04/18/14 1330  BP: 117/55  Pulse: 88  Temp: 98.3 F (36.8 C)  Resp: 14   Filed Vitals:   04/18/14 0830 04/18/14 1000 04/18/14 1108 04/18/14 1330  BP:  94/55  117/55  Pulse:  98  88  Temp:  97.8 F (36.6 C)  98.3 F (36.8 C)  TempSrc:  Oral  Oral  Resp: 10 16 11 14   Height:      Weight:  SpO2: 95% 100% 95% 99%   General: Alert, awake, oriented x3, in no acute distress.  HEENT: [place]/AT PEERL, EOMI  Neck: Trachea midline, no masses, no thyromegal,y no JVD, no carotid bruit  OROPHARYNX: Moist, No exudate/ erythema/lesions.  Heart: Regular rate and rhythm, without murmurs, rubs, gallops.  Lungs: Clear to auscultation but unable to take deep breath due to CP, no wheezing or rhonchi noted. No increased vocal fremitus resonant to percussion  Abdomen: Soft, nontender, nondistended, positive bowel sounds, no masses no hepatosplenomegaly noted..  Neuro: No focal  neurological deficits noted cranial nerves II through XII grossly intact. Strength 5 out of 5 in bilateral upper and lower extremities.  Musculoskeletal: No warm swelling or erythema around joints, no spinal tenderness noted. ROM of left arm is limited to 90 degrees abduction due to pain.  Psychiatric: Patient alert and oriented x3, good insight and cognition, good recent to remote recall.   Discharge Instructions Follow-up with PCP in 6 days.     Medication List    TAKE these medications        alprazolam 2 MG tablet  Commonly known as:  XANAX  Take 1 tablet by mouth 2 (two) times daily as needed. Anxiety     hydroxyurea 500 MG capsule  Commonly known as:  HYDREA  Take 3 capsules (1,500 mg total) by mouth daily. May take with food to minimize GI side effects.     methadone 5 MG tablet  Commonly known as:  DOLOPHINE  Take 1 tablet (5 mg total) by mouth at bedtime.     Oxycodone HCl 10 MG Tabs  Take 1.5 tablets (15 mg total) by mouth every 6 (six) hours as needed (moderate pain).     oxyCODONE-acetaminophen 10-325 MG per tablet  Commonly known as:  PERCOCET  Take 1 tablet by mouth every 4 (four) hours as needed. pain     sulindac 150 MG tablet  Commonly known as:  CLINORIL  Take 1 tablet (150 mg total) by mouth 2 (two) times daily as needed (Pain in right thigh).          The results of significant diagnostics from this hospitalization (including imaging, microbiology, ancillary and laboratory) are listed below for reference.     Significant Diagnostic Studies: Dg Neck Soft Tissue  04/16/2014   CLINICAL DATA:  Stridor.  Group a strep.  Initial encounter.  EXAM: NECK SOFT TISSUES - 1+ VIEW  COMPARISON:  None.  FINDINGS: Levoconvex torticollis is present. The prevertebral soft tissues appear within normal limits. The epiglottis is normal.  IMPRESSION: 1. Normal epiglottis. 2. Mild levoconvex torticollis.   Electronically Signed   By: Dereck Ligas M.D.   On: 04/16/2014  14:58   Dg Chest 2 View  04/15/2014   CLINICAL DATA:  Cough, fever, history of pneumonia and strep throat, sickle cell disease  EXAM: CHEST  2 VIEW  COMPARISON:  04/14/2014  FINDINGS: RIGHT arm PICC line with tip projecting over mid SVC.  Enlargement of cardiac silhouette with pulmonary vascular congestion.  RIGHT upper lobe volume loss.  No gross infiltrate, pleural effusion, or pneumothorax.  Bones unremarkable.  IMPRESSION: Enlargement of cardiac silhouette with pulmonary vascular congestion consistent with history of sickle cell disease.  No acute infiltrate.   Electronically Signed   By: Lavonia Dana M.D.   On: 04/15/2014 15:25   US Abdomen Limited  04/16/2014   CLINICAL DATA:  Abdominal wall abscess. Sickle cell crisis. Abdominal wall pain.  EXAM: LIMITED ABDOMINAL ULTRASOUND  COMPARISON:  None.  FINDINGS: Scanning was performed over the area of pain. There was no abscess. No subcutaneous edema was identified. The abdominal wall appears normal.  IMPRESSION: Negative.   Electronically Signed   By: Dereck Ligas M.D.   On: 04/16/2014 14:44   Ir Fluoro Guide Cv Line Right  04/14/2014   CLINICAL DATA:  Sickle cell crisis, acute pain crisis, no current access  EXAM: POWER PICC LINE PLACEMENT WITH ULTRASOUND AND FLUOROSCOPIC GUIDANCE  FLUOROSCOPY TIME:  12 seconds  PROCEDURE: The patient was advised of the possible risks andcomplications and agreed to undergo the procedure. The patient was then brought to the angiographic suite for the procedure.  The right arm was prepped with chlorhexidine, drapedin the usual sterile fashion using maximum barrier technique (cap and mask, sterile gown, sterile gloves, large sterile sheet, hand hygiene and cutaneous antisepsis) and infiltrated locally with 1% Lidocaine.  Ultrasound demonstrated patency of the right brachial vein, and this was documented with an image. Under real-time ultrasound guidance, this vein was accessed with a 21 gauge micropuncture needle and  image documentation was performed. A 0.018 wire was introduced in to the vein. Over this, a 5 [place] double lumen power PICC was advanced to the lower SVC/right atrial junction. Fluoroscopy during the procedure and fluoro spot radiograph confirms appropriate catheter position. The catheter was flushed and covered with asterile dressing.  Catheter length: 38  Complications: None immediate  IMPRESSION: Successful right arm power PICC line placement with ultrasound and fluoroscopic guidance. The catheter is ready for use.   Electronically Signed   By: Daryll Brod M.D.   On: 04/14/2014 08:18   Ir US Guide Vasc Access Right  04/14/2014   CLINICAL DATA:  Sickle cell crisis, acute pain crisis, no current access  EXAM: POWER PICC LINE PLACEMENT WITH ULTRASOUND AND FLUOROSCOPIC GUIDANCE  FLUOROSCOPY TIME:  12 seconds  PROCEDURE: The patient was advised of the possible risks andcomplications and agreed to undergo the procedure. The patient was then brought to the angiographic suite for the procedure.  The right arm was prepped with chlorhexidine, drapedin the usual sterile fashion using maximum barrier technique (cap and mask, sterile gown, sterile gloves, large sterile sheet, hand hygiene and cutaneous antisepsis) and infiltrated locally with 1% Lidocaine.  Ultrasound demonstrated patency of the right brachial vein, and this was documented with an image. Under real-time ultrasound guidance, this vein was accessed with a 21 gauge micropuncture needle and image documentation was performed. A 0.018 wire was introduced in to the vein. Over this, a 5 [place] double lumen power PICC was advanced to the lower SVC/right atrial junction. Fluoroscopy during the procedure and fluoro spot radiograph confirms appropriate catheter position. The catheter was flushed and covered with asterile dressing.  Catheter length: 38  Complications: None immediate  IMPRESSION: Successful right arm power PICC line placement with ultrasound and  fluoroscopic guidance. The catheter is ready for use.   Electronically Signed   By: Daryll Brod M.D.   On: 04/14/2014 08:18   Dg Chest Port 1 View  04/14/2014   CLINICAL DATA:  Fever.  Sickle cell.  EXAM: PORTABLE CHEST - 1 VIEW  COMPARISON:  02/16/2014  FINDINGS: Bilateral airspace disease has developed since the prior study and may represent pneumonia. Atelectasis and fluid overload also possible. No effusion.  Cardiac enlargement.  Right arm PICC tip in the SVC  Bony changes of sickle cell.  IMPRESSION: Diffuse bilateral airspace disease may represent pneumonia or edema.   Electronically Signed  By: Franchot Gallo M.D.   On: 04/14/2014 07:39     Microbiology: Recent Results (from the past 240 hour(s))  Culture, Urine     Status: None   Collection Time: 04/14/14  2:28 AM  Result Value Ref Range Status   Specimen Description URINE, CLEAN CATCH  Final   Special Requests NONE  Final   Culture  Setup Time   Final    04/14/2014 08:59 Performed at [place] Performed at [facility]   Final   Culture NO GROWTH Performed at [facility]   Final   Report Status 04/15/2014 FINAL  Final  Culture, blood (routine x 2)     Status: None (Preliminary result)   Collection Time: 04/14/14  3:12 AM  Result Value Ref Range Status   Specimen Description BLOOD R HAND  Final   Special Requests BOTTLES DRAWN AEROBIC ONLY 5 CC  Final   Culture  Setup Time   Final    04/14/2014 09:20 Performed at [facility]    Culture   Final           BLOOD CULTURE RECEIVED NO GROWTH TO DATE CULTURE WILL BE HELD FOR 5 DAYS BEFORE ISSUING A FINAL NEGATIVE REPORT Performed at [facility]    Report Status PENDING  Incomplete  Culture, blood (routine x 2)     Status: None (Preliminary result)   Collection Time: 04/14/14  3:15 AM  Result Value Ref Range Status   Specimen Description BLOOD R HAND  Final   Special Requests BOTTLES DRAWN AEROBIC  ONLY 5CC  Final   Culture  Setup Time   Final    04/14/2014 09:20 Performed at [facility]    Culture   Final           BLOOD CULTURE RECEIVED NO GROWTH TO DATE CULTURE WILL BE HELD FOR 5 DAYS BEFORE ISSUING A FINAL NEGATIVE REPORT Performed at [facility]    Report Status PENDING  Incomplete  Rapid strep screen     Status: Abnormal   Collection Time: 04/15/14 11:15 AM  Result Value Ref Range Status   Streptococcus, Group A Screen (Direct) POSITIVE (A) NEGATIVE Final     Labs: Basic Metabolic Panel:  Recent Labs Lab 04/13/14 0505 04/14/14 0545 04/15/14 0453 04/17/14 0545 04/18/14 0428  NA 135* 136* 137 141 137  K 3.8 3.8 4.2 3.8 3.0*  CL 100 100 98 101 96  CO2 26 25 27 27 28   GLUCOSE 114* 111* 120* 84 95  BUN 7 5* 8 5* 5*  CREATININE 0.86 0.66 0.95 0.98 0.96  CALCIUM 8.6 8.6 8.9 8.9 8.6   Liver Function Tests:  Recent Labs Lab 04/12/14 0409 04/13/14 0505 04/14/14 0545 04/15/14 0453 04/18/14 0428  AST 15 18 25  38* 17  ALT 11 13 16 23 14   ALKPHOS 72 54 58 65 63  BILITOT 2.3* 1.9* 3.8* 4.6* 1.6*  PROT 6.7 6.4 6.6 6.9 6.6  ALBUMIN 3.5 3.3* 3.3* 3.3* 3.0*    Recent Labs Lab 04/14/14 0545 04/15/14 0453 04/16/14 0515 04/17/14 0545 04/18/14 0428  WBC 22.6* 21.8* 18.5* 15.5* 16.7*  NEUTROABS 19.9* 19.0* 14.4* 11.9* 12.6*  HGB 6.8* 6.6* 7.4* 7.4* 7.7*  HCT 19.2* 18.6* 21.2* 21.0* 22.1*  MCV 91.4 90.7 90.6 90.1 91.3  PLT 324 342 375 461* 525*    Time coordinating discharge: 1 hour.  SignedKalman Shan  04/18/2014, 4:35 PM

## 2014-04-18 NOTE — Plan of Care (Signed)
Problem: Phase I Progression Outcomes Goal: Bowel Movement At Least Every 3 Days Outcome: Completed/Met Date Met:  04/18/14 Patient had small BM today prior to discharge.  Problem: Phase II Progression Outcomes Goal: Pain at, < goal with appropriate interventions Outcome: Adequate for Discharge Goal: Other Phase II Outcomes/Goals Outcome: Not Applicable Date Met:  77/11/65  Problem: Phase III Progression Outcomes Goal: Pain controlled on oral analgesia Outcome: Completed/Met Date Met:  04/18/14 Goal: Progress with ambulation Outcome: Completed/Met Date Met:  04/18/14 Goal: IV fluids wean to po Outcome: Completed/Met Date Met:  04/18/14 Goal: Discharge plan remains appropriate-arrangements made Outcome: Completed/Met Date Met:  04/18/14 Goal: Other Phase III Outcomes/Goals Outcome: Not Applicable Date Met:  79/03/83  Problem: Discharge Progression Outcomes Goal: Barriers To Progression Addressed/Resolved Outcome: Not Applicable Date Met:  33/83/29 Goal: Discharge plan in place and appropriate Outcome: Completed/Met Date Met:  04/18/14 Goal: Pain controlled with appropriate interventions Outcome: Completed/Met Date Met:  04/18/14 Goal: Hemodynamically stable Outcome: Adequate for Discharge Goal: Complications resolved/controlled Outcome: Completed/Met Date Met:  04/18/14 Goal: Activity appropriate for discharge plan Outcome: Completed/Met Date Met:  04/18/14 Goal: Other Discharge Outcomes/Goals Outcome: Not Applicable Date Met:  19/16/60

## 2014-04-18 NOTE — Plan of Care (Signed)
Problem: Consults Goal: Sickle Cell Crisis Patient Education Outcome: Completed/Met Date Met:  04/18/14 Goal: Nutrition Consult-if indicated Outcome: Not Applicable Date Met:  21/97/58  Problem: Phase I Progression Outcomes Goal: Other Phase I Outcomes/Goals Outcome: Not Applicable Date Met:  83/25/49  Problem: Phase II Progression Outcomes Goal: Discharge plan established Outcome: Completed/Met Date Met:  04/18/14

## 2014-04-18 NOTE — Discharge Instructions (Signed)
Cough, Adult  A cough is a reflex that helps clear your throat and airways. It can help heal the body or may be a reaction to an irritated airway. A cough may only last 2 or 3 weeks (acute) or may last more than 8 weeks (chronic).  CAUSES Acute cough:  Viral or bacterial infections. Chronic cough:  Infections.  Allergies.  Asthma.  Post-nasal drip.  Smoking.  Heartburn or acid reflux.  Some medicines.  Chronic lung problems (COPD).  Cancer. SYMPTOMS   Cough.  Fever.  Chest pain.  Increased breathing rate.  High-pitched whistling sound when breathing (wheezing).  Colored mucus that you cough up (sputum). TREATMENT   A bacterial cough may be treated with antibiotic medicine.  A viral cough must run its course and will not respond to antibiotics.  Your caregiver may recommend other treatments if you have a chronic cough. HOME CARE INSTRUCTIONS   Only take over-the-counter or prescription medicines for pain, discomfort, or fever as directed by your caregiver. Use cough suppressants only as directed by your caregiver.  Use a cold steam vaporizer or humidifier in your bedroom or home to help loosen secretions.  Sleep in a semi-upright position if your cough is worse at night.  Rest as needed.  Stop smoking if you smoke. SEEK IMMEDIATE MEDICAL CARE IF:   You have pus in your sputum.  Your cough starts to worsen.  You cannot control your cough with suppressants and are losing sleep.  You begin coughing up blood.  You have difficulty breathing.  You develop pain which is getting worse or is uncontrolled with medicine.  You have a fever. MAKE SURE YOU:   Understand these instructions.  Will watch your condition.  Will get help right away if you are not doing well or get worse. Document Released: 10/29/2010 Document Revised: 07/25/2011 Document Reviewed: 10/29/2010 ExitCare Patient Information 2015 [employer]. This information is not intended  to replace advice given to you by your health care provider. Make sure you discuss any questions you have with your health care provider.  

## 2014-04-18 NOTE — Care Management Note (Signed)
04/18/14 Marney Doctor RN,BSN,NCM [phone]  IM letter given to patient.

## 2014-04-20 LAB — CULTURE, BLOOD (ROUTINE X 2)
CULTURE: NO GROWTH
Culture: NO GROWTH

## 2014-04-23 ENCOUNTER — Ambulatory Visit (INDEPENDENT_AMBULATORY_CARE_PROVIDER_SITE_OTHER): Payer: Medicare Other | Admitting: Family Medicine

## 2014-04-23 ENCOUNTER — Other Ambulatory Visit: Payer: Self-pay | Admitting: Family Medicine

## 2014-04-23 VITALS — BP 121/75 | HR 97 | Temp 99.3°F | Resp 16 | Ht 63.0 in | Wt 161.0 lb

## 2014-04-23 DIAGNOSIS — M549 Dorsalgia, unspecified: Secondary | ICD-10-CM | POA: Diagnosis not present

## 2014-04-23 DIAGNOSIS — F329 Major depressive disorder, single episode, unspecified: Secondary | ICD-10-CM

## 2014-04-23 DIAGNOSIS — G8929 Other chronic pain: Secondary | ICD-10-CM

## 2014-04-23 DIAGNOSIS — D571 Sickle-cell disease without crisis: Secondary | ICD-10-CM | POA: Diagnosis not present

## 2014-04-23 DIAGNOSIS — Z658 Other specified problems related to psychosocial circumstances: Secondary | ICD-10-CM

## 2014-04-23 DIAGNOSIS — F439 Reaction to severe stress, unspecified: Secondary | ICD-10-CM

## 2014-04-23 DIAGNOSIS — Z3201 Encounter for pregnancy test, result positive: Secondary | ICD-10-CM | POA: Diagnosis not present

## 2014-04-23 DIAGNOSIS — F32A Depression, unspecified: Secondary | ICD-10-CM

## 2014-04-23 LAB — COMPLETE METABOLIC PANEL WITH GFR
ALK PHOS: 56 U/L (ref 39–117)
ALT: 12 U/L (ref 0–35)
AST: 18 U/L (ref 0–37)
Albumin: 4.2 g/dL (ref 3.5–5.2)
BUN: 11 mg/dL (ref 6–23)
CO2: 22 meq/L (ref 19–32)
Calcium: 8.8 mg/dL (ref 8.4–10.5)
Chloride: 102 mEq/L (ref 96–112)
Creat: 0.83 mg/dL (ref 0.50–1.10)
GFR, Est Non African American: >89 mL/min
Glucose, Bld: 96 mg/dL (ref 70–99)
Potassium: 4.4 mEq/L (ref 3.5–5.3)
SODIUM: 135 meq/L (ref 135–145)
TOTAL PROTEIN: 7.8 g/dL (ref 6.0–8.3)
Total Bilirubin: 1 mg/dL (ref 0.2–1.2)

## 2014-04-23 MED ORDER — OXYCODONE HCL 10 MG PO TABS: 15.0000 mg | ORAL_TABLET | Freq: Four times a day (QID) | ORAL | Status: DC | PRN

## 2014-04-23 MED ORDER — METHADONE HCL 5 MG PO TABS: 5.0000 mg | ORAL_TABLET | Freq: Every day | ORAL | Status: DC

## 2014-04-23 NOTE — Progress Notes (Signed)
Subjective:    Patient ID: [name], female    DOB: 1991/01/05, 23 y.o.   MRN: [id number]  HPI Patient with a history of sickle cell anemia, HbSS presents for a hospital follow-up. [name] is complaining of pain to lower back and upper extremities. She states that pain was minimally relieved by Oxycodone, last taken around 10 am. Her current pain intensity is 5/10 described as constant and aching. She maintains that she is currently out of pain medication. She denies headache, shortness of breath, chest pains, vomiting or diarrhea.   [name] is also complaining of amenorrhea. She is sexually active and last menstrual period was on 03/18/2014. She states that she took a pregnancy test 2 days ago, which was positive. She currently reports nausea, she denies abdominal pain or vaginal bleeding.  Past Medical History  Diagnosis Date  . H/O: 1 miscarriage 03/22/2011    Pt reports 2 miscarriages.  [name] 01/08/2009  . Depression 01/06/2011  . GERD (gastroesophageal reflux disease) 02/17/2011  . Trichotillomania     h/o  . Blood transfusion     "lots"  . Sickle cell anemia with crisis   . Exertional dyspnea     "sometimes"  . Sickle cell anemia   . Headache(784.0)   . Migraines 11/08/11    "@ least twice/month"  . Chronic back pain     "very severe; have knot in my back; from tight muscle; take RX and exercise for it"  . Mood swings 11/08/11    "I go back and forth; real bad"  . Pregnancy   . Blood dyscrasia     SICKLE CELL   Review of Systems  Constitutional: Positive for fatigue (occasional).  Eyes: Negative.   Respiratory: Negative.  Negative for shortness of breath.   Gastrointestinal: Positive for nausea. Negative for vomiting.  Endocrine: Negative.   Genitourinary: Negative for vaginal bleeding, vaginal discharge and vaginal pain.  Musculoskeletal: Positive for myalgias (upper extremity pain) and back pain (low back pain).  Skin: Negative.    Allergic/Immunologic: Negative.   Neurological: Negative.   Hematological: Negative.   Psychiatric/Behavioral:       She reports depression. She reports anhedonia and occasional feelings of hopelessness.        Objective:   Physical Exam  Constitutional: She is oriented to person, place, and time. She appears well-developed and well-nourished.  HENT:  Head: Normocephalic and atraumatic.  Right Ear: External ear normal.  Left Ear: External ear normal.  Nose: Nose normal.  Mouth/Throat: Oropharynx is clear and moist.  Eyes: Conjunctivae are normal. Pupils are equal, round, and reactive to light. Right eye exhibits discharge. Left eye exhibits no discharge. No scleral icterus.  Neck: Normal range of motion. Neck supple. No tracheal deviation present. No thyromegaly present.  Cardiovascular: Normal rate, regular rhythm, normal heart sounds and intact distal pulses.   Pulmonary/Chest: Effort normal and breath sounds normal.  Abdominal: Soft. Bowel sounds are normal.  Musculoskeletal:       Lumbar back: She exhibits pain. She exhibits normal range of motion, no swelling and no spasm.  Neurological: She is alert and oriented to person, place, and time. She has normal reflexes.  Skin: Skin is warm and dry.  Psychiatric: Her speech is normal and behavior is normal. Judgment and thought content normal. She exhibits a depressed mood.         BP 121/75 mmHg  Pulse 97  Temp(Src) 99.3 F (37.4 C) (Oral)  Resp 16  Ht 5\' 3"  (1.6 m)  Wt 161 lb (73.029 kg)  BMI 28.53 kg/m2  LMP 03/20/2014 Assessment & Plan:   1.Pregnancy test positive [name] reports that she is currently sexually active without oral or barrier contraceptives. She took a home pregnancy test several days ago, which was positive. Patient had a pregnancy test on 04/09/14, which was negative.  I will check a serum pregnancy test. [name] reports having 2 miscarriages in the past. We discussed the importance of prenatal  care during pregnancy.  - hCG, serum, qualitative  2. Hb-SS disease without crisis Sickle cell disease - Patient reports a positive home pregnancy test. I instructed [name] to hold Hydrea. We discussed the need to continue good hydration, monitoring of hydration status, avoidance of heat, cold, stress, and infection triggers.  Continue folic acid 1 mg daily to prevent aplastic bone marrow crises. Also, we discussed the need for increased folic acid during pregnancy.   Pulmonary evaluation - Patient denies severe recurrent wheezes, shortness of breath with exercise, or persistent cough. If these symptoms develop, pulmonary function tests with spirometry will be ordered, and if abnormal, plan on referral to Pulmonology for further evaluation.  Cardiac - Routine screening for pulmonary hypertension is not recommended.  Eye - High risk of proliferative retinopathy. Annual eye exam with retinal exam recommended to patient.   Immunization status - She is up to date on vaccinations.   Acute and chronic painful episodes - We agreed on a weekly opiate medication pick-up schedule. She is to call in prescriptions weekly. We discussed that she is to receive her Schedule II prescriptions only from [place]. She is also aware that her prescription history is available to [place] online through the [place]. Controlled substance agreement signed on 04/23/2014. Marguerite [name], from [facility] [facility] was present as we discussed current pain contract.  I also reminded [name] that all patients receiving Schedule II narcotics must be seen for follow up every three months. We reviewed the terms of our pain agreement, including the need to keep medicines in a safe locked location away from children or pets, and the need to report excess sedation or constipation, measures to avoid constipation, and policies related to early refills and stolen prescriptions. According to the [place] [facility] program, we  have reviewed details related to analgesia, adverse effects, aberrant behaviors.   Iron overload from chronic transfusion.     - Prescription Monitoring Profile-[facility] - COMPLETE METABOLIC PANEL WITH GFR - CBC with Differential   The above recommendations are taken from the NIH Evidence-Based Management of Sickle Cell Disease: Expert Panel Report, 20149.   3. Chronic back pain - Prescription Monitoring Profile-[facility]  4. Stress She reports that she is under increased stress due to recent positive pregnancy test. She also states that she is also stressed due to financial constraints. She has an appointment scheduled with Ms. Jerilynn Mages. Shurlock at [facility] for assistance with seeking financial assistance.   5. Depression Current PHQ-9 score is 5. Ms. Whitmire reports that she feels sad the majority of the time. She currently denies suicidal or homicidal ideations. I will send referral to Dr. Bishop Limbo, psychotherapist.    Dorena Dew, FNP

## 2014-04-24 LAB — CBC WITH DIFFERENTIAL/PLATELET
Basophils Absolute: 0 10*3/uL (ref 0.0–0.1)
Basophils Relative: 0 % (ref 0–1)
EOS ABS: 0.1 10*3/uL (ref 0.0–0.7)
Eosinophils Relative: 1 % (ref 0–5)
HEMATOCRIT: 27 % — AB (ref 36.0–46.0)
Hemoglobin: 9.2 g/dL — ABNORMAL LOW (ref 12.0–15.0)
LYMPHS ABS: 1.8 10*3/uL (ref 0.7–4.0)
LYMPHS PCT: 17 % (ref 12–46)
MCH: 31.6 pg (ref 26.0–34.0)
MCHC: 34.1 g/dL (ref 30.0–36.0)
MCV: 92.8 fL (ref 78.0–100.0)
MONO ABS: 1 10*3/uL (ref 0.1–1.0)
MONOS PCT: 10 % (ref 3–12)
MPV: 9.3 fL — ABNORMAL LOW (ref 9.4–12.4)
Neutro Abs: 7.5 10*3/uL (ref 1.7–7.7)
Neutrophils Relative %: 72 % (ref 43–77)
Platelets: 898 10*3/uL — ABNORMAL HIGH (ref 150–400)
RBC: 2.91 MIL/uL — ABNORMAL LOW (ref 3.87–5.11)
RDW: 18.8 % — ABNORMAL HIGH (ref 11.5–15.5)
WBC: 10.4 10*3/uL (ref 4.0–10.5)

## 2014-04-24 LAB — HCG, SERUM, QUALITATIVE: PREG SERUM: POSITIVE

## 2014-04-24 LAB — HCG, QUANTITATIVE, PREGNANCY: HCG, BETA CHAIN, QUANT, S: 387.2 m[IU]/mL

## 2014-04-24 MED ORDER — FOLIC ACID 1 MG PO TABS: 2.0000 mg | ORAL_TABLET | Freq: Every day | ORAL | Status: DC

## 2014-04-24 NOTE — Telephone Encounter (Signed)
Patient is [redacted] week pregnant as confirmed by quantitative HCG. We discussed the importance of folic acid therapy and prenatal care. I will send a referral to [facility], she is currently a patient there. She is to follow up in office as scheduled.    Dorena Dew, FNP

## 2014-04-27 ENCOUNTER — Encounter: Payer: Self-pay | Admitting: Family Medicine

## 2014-04-28 LAB — OPIATES/OPIOIDS (LC/MS-MS)
Codeine Urine: NEGATIVE ng/mL (ref <50)
HYDROCODONE: 79 ng/mL — AB (ref <50)
Hydromorphone: 496 ng/mL — AB (ref <50)
MORPHINE: NEGATIVE ng/mL (ref <50)
NORHYDROCODONE, UR: 189 ng/mL — AB (ref <50)
Noroxycodone, Ur: 151 ng/mL — AB (ref <50)
Oxycodone, ur: 65 ng/mL — AB (ref <50)
Oxymorphone: 388 ng/mL — AB (ref <50)

## 2014-04-28 LAB — METHADONE (GC/LC/MS), URINE
EDDP (GC/LC/MS), ur confirm: 948 ng/mL — AB (ref <100)
Methadone (GC/LC/MS), ur confirm: 511 ng/mL — AB (ref <100)

## 2014-04-28 LAB — OXYCODONE, URINE (LC/MS-MS)
Noroxycodone, Ur: 151 ng/mL — AB (ref <50)
OXYMORPHONE, URINE: 388 ng/mL — AB (ref <50)
Oxycodone, ur: 65 ng/mL — AB (ref <50)

## 2014-04-29 ENCOUNTER — Telehealth: Payer: Self-pay | Admitting: Family Medicine

## 2014-04-29 ENCOUNTER — Inpatient Hospital Stay (HOSPITAL_COMMUNITY): Admission: AD | Admit: 2014-04-29 | Payer: Self-pay | Source: Ambulatory Visit

## 2014-04-29 ENCOUNTER — Ambulatory Visit (HOSPITAL_BASED_OUTPATIENT_CLINIC_OR_DEPARTMENT_OTHER)
Admission: RE | Admit: 2014-04-29 | Discharge: 2014-04-29 | Disposition: A | Discharge status: Home / Self Care | Payer: Medicare Other | Source: Ambulatory Visit | Attending: Internal Medicine | Admitting: Internal Medicine

## 2014-04-29 ENCOUNTER — Ambulatory Visit (INDEPENDENT_AMBULATORY_CARE_PROVIDER_SITE_OTHER): Payer: Medicare Other | Admitting: Family Medicine

## 2014-04-29 ENCOUNTER — Encounter: Payer: Self-pay | Admitting: Family Medicine

## 2014-04-29 ENCOUNTER — Telehealth (HOSPITAL_COMMUNITY): Payer: Self-pay | Admitting: Hematology

## 2014-04-29 ENCOUNTER — Telehealth: Payer: Self-pay | Admitting: Hematology

## 2014-04-29 VITALS — BP 118/67 | HR 114 | Temp 99.4°F | Resp 16 | Ht 62.0 in | Wt 162.0 lb

## 2014-04-29 DIAGNOSIS — Z331 Pregnant state, incidental: Secondary | ICD-10-CM

## 2014-04-29 DIAGNOSIS — K219 Gastro-esophageal reflux disease without esophagitis: Secondary | ICD-10-CM | POA: Insufficient documentation

## 2014-04-29 DIAGNOSIS — Z3490 Encounter for supervision of normal pregnancy, unspecified, unspecified trimester: Secondary | ICD-10-CM | POA: Insufficient documentation

## 2014-04-29 DIAGNOSIS — O99011 Anemia complicating pregnancy, first trimester: Secondary | ICD-10-CM | POA: Diagnosis not present

## 2014-04-29 DIAGNOSIS — D571 Sickle-cell disease without crisis: Secondary | ICD-10-CM

## 2014-04-29 DIAGNOSIS — D5700 Hb-SS disease with crisis, unspecified: Secondary | ICD-10-CM

## 2014-04-29 DIAGNOSIS — J029 Acute pharyngitis, unspecified: Secondary | ICD-10-CM

## 2014-04-29 DIAGNOSIS — J020 Streptococcal pharyngitis: Secondary | ICD-10-CM

## 2014-04-29 DIAGNOSIS — R509 Fever, unspecified: Secondary | ICD-10-CM

## 2014-04-29 DIAGNOSIS — G8929 Other chronic pain: Secondary | ICD-10-CM

## 2014-04-29 LAB — CBC WITH DIFFERENTIAL/PLATELET
BASOS PCT: 0 % (ref 0–1)
Basophils Absolute: 0.1 10*3/uL (ref 0.0–0.1)
EOS ABS: 0.1 10*3/uL (ref 0.0–0.7)
EOS PCT: 1 % (ref 0–5)
HCT: 24.8 % — ABNORMAL LOW (ref 36.0–46.0)
HEMOGLOBIN: 8.6 g/dL — AB (ref 12.0–15.0)
LYMPHS ABS: 1.2 10*3/uL (ref 0.7–4.0)
Lymphocytes Relative: 8 % — ABNORMAL LOW (ref 12–46)
MCH: 32.2 pg (ref 26.0–34.0)
MCHC: 34.7 g/dL (ref 30.0–36.0)
MCV: 92.9 fL (ref 78.0–100.0)
MONOS PCT: 11 % (ref 3–12)
Monocytes Absolute: 1.7 10*3/uL — ABNORMAL HIGH (ref 0.1–1.0)
NEUTROS PCT: 80 % — AB (ref 43–77)
Neutro Abs: 12.7 10*3/uL — ABNORMAL HIGH (ref 1.7–7.7)
Platelets: 737 10*3/uL — ABNORMAL HIGH (ref 150–400)
RBC: 2.67 MIL/uL — AB (ref 3.87–5.11)
RDW: 17.2 % — ABNORMAL HIGH (ref 11.5–15.5)
WBC: 15.7 10*3/uL — ABNORMAL HIGH (ref 4.0–10.5)

## 2014-04-29 LAB — PRESCRIPTION MONITORING PROFILE (SOLSTAS)
Amphetamine/Meth: NEGATIVE ng/mL
BUPRENORPHINE, URINE: NEGATIVE ng/mL
Barbiturate Screen, Urine: NEGATIVE ng/mL
Benzodiazepine Screen, Urine: NEGATIVE ng/mL
Cannabinoid Scrn, Ur: NEGATIVE ng/mL
Carisoprodol, Urine: NEGATIVE ng/mL
Cocaine Metabolites: NEGATIVE ng/mL
Creatinine, Urine: 87.43 mg/dL (ref >20.0)
FENTANYL URINE: NEGATIVE ng/mL
MDMA URINE: NEGATIVE ng/mL
Meperidine, Ur: NEGATIVE ng/mL
Nitrites, Initial: NEGATIVE ug/mL
PH URINE, INITIAL: 5.9 pH (ref 4.5–8.9)
PROPOXYPHENE: NEGATIVE ng/mL
TRAMADOL UR: NEGATIVE ng/mL
Tapentadol, urine: NEGATIVE ng/mL
Zolpidem, Urine: NEGATIVE ng/mL

## 2014-04-29 LAB — COMPREHENSIVE METABOLIC PANEL
ALK PHOS: 58 U/L (ref 39–117)
ALT: 9 U/L (ref 0–35)
AST: 14 U/L (ref 0–37)
Albumin: 3.8 g/dL (ref 3.5–5.2)
Anion gap: 12 (ref 5–15)
BUN: 4 mg/dL — ABNORMAL LOW (ref 6–23)
CALCIUM: 9.3 mg/dL (ref 8.4–10.5)
CO2: 22 meq/L (ref 19–32)
Chloride: 95 mEq/L — ABNORMAL LOW (ref 96–112)
Creatinine, Ser: 0.67 mg/dL (ref 0.50–1.10)
GLUCOSE: 99 mg/dL (ref 70–99)
POTASSIUM: 3.5 meq/L — AB (ref 3.7–5.3)
Sodium: 129 mEq/L — ABNORMAL LOW (ref 137–147)
TOTAL PROTEIN: 7.6 g/dL (ref 6.0–8.3)
Total Bilirubin: 1.2 mg/dL (ref 0.3–1.2)

## 2014-04-29 LAB — RETICULOCYTES
RBC.: 2.67 MIL/uL — ABNORMAL LOW (ref 3.87–5.11)
Retic Count, Absolute: 133.5 10*3/uL (ref 19.0–186.0)
Retic Ct Pct: 5 % — ABNORMAL HIGH (ref 0.4–3.1)

## 2014-04-29 MED ORDER — HYDROMORPHONE HCL 2 MG/ML IJ SOLN
2.7000 mg | Freq: Once | INTRAMUSCULAR | Status: AC
  Administered 2014-04-29: 2.7 mg via INTRAVENOUS
  Filled 2014-04-29: qty 2

## 2014-04-29 MED ORDER — HYDROMORPHONE HCL 2 MG/ML IJ SOLN
2.2000 mg | Freq: Once | INTRAMUSCULAR | Status: AC
  Administered 2014-04-29: 2.2 mg via INTRAVENOUS
  Filled 2014-04-29: qty 2

## 2014-04-29 MED ORDER — DEXTROSE-NACL 5-0.45 % IV SOLN
INTRAVENOUS | Status: DC
  Administered 2014-04-29: 16:00:00 via INTRAVENOUS

## 2014-04-29 MED ORDER — OXYCODONE HCL 10 MG PO TABS: 15.0000 mg | ORAL_TABLET | Freq: Four times a day (QID) | ORAL | Status: DC | PRN

## 2014-04-29 MED ORDER — OXYCODONE HCL 5 MG PO TABS
15.0000 mg | ORAL_TABLET | Freq: Once | ORAL | Status: AC
  Administered 2014-04-29: 15 mg via ORAL
  Filled 2014-04-29: qty 3

## 2014-04-29 MED ORDER — METHADONE HCL 5 MG PO TABS: 5.0000 mg | ORAL_TABLET | Freq: Every day | ORAL | Status: DC

## 2014-04-29 MED ORDER — HYDROMORPHONE HCL 2 MG/ML IJ SOLN
2.0000 mg | Freq: Once | INTRAMUSCULAR | Status: AC
  Administered 2014-04-29: 2 mg via INTRAVENOUS
  Filled 2014-04-29: qty 1

## 2014-04-29 MED ORDER — PENICILLIN G BENZATHINE 1200000 UNIT/2ML IM SUSP
2.4000 10*6.[IU] | Freq: Once | INTRAMUSCULAR | Status: AC
  Administered 2014-04-29: 2.4 10*6.[IU] via INTRAMUSCULAR

## 2014-04-29 MED ORDER — HYDROMORPHONE HCL 2 MG/ML IJ SOLN
1.8000 mg | Freq: Once | INTRAMUSCULAR | Status: AC
  Administered 2014-04-29: 1.8 mg via INTRAVENOUS
  Filled 2014-04-29: qty 1

## 2014-04-29 NOTE — Telephone Encounter (Signed)
Patient C/O pain to back and right wrist, and head ache.  Rates pain 7/10 on pain scale.  Patient states she has thrown up x2, and she states she feels like a fever.   Denies diarrhea, chest pain or shortness of breath, denies abdominal pain. Patient is requesting [facility].  I advised that I would notify the physician and give her a call back.  Patient verbalizes the understanding

## 2014-04-29 NOTE — H&P (Signed)
[facility] History and Physical   Date: 04/29/2014  Patient name: [name] Medical record number: [id number] Date of birth: [birth year] Age: 23 y.o. Gender: female PCP: MATTHEWS,MICHELLE A., MD  Attending physician: [name], MD  Chief Complaint: Sickle cell pain  History of Present Illness: [name], a 23 year old female with a history of sickle cell anema, HbSS was transitioned to the day infusion center from primary care for a 1 day history of increased midback and right wrist pain that is consistent with her usual sickle cell crisis. [name] states that she worked a 12 hour shift on 04/28/2014 and felt increased fatigue and wrist pain by shift's end. She is in her 1st trimester of pregnancy and thought that fatigue was related to morning sickness. Also, she maintains that she went to work this am and had 2 episodes of emesis and nausea. She states that she is also experiencing increased throat pain. She had a positive strep test and received an IM dose of Bicillin in primary care clinic. Her current pain intensity is 7/10 to mid back and right wrist, pain is described as constant and throbbing. She last had Oxycodone 15 mg around 11am, with minimal relief. She reports throat pain, but denies headache, shortness of breath, chest pain, nausea, vomiting, or diarrhea.    Allergies: Carrot oil and Latex Past Medical History  Diagnosis Date  . H/O: 1 miscarriage 03/22/2011    Pt reports 2 miscarriages.  [name] 01/08/2009  . Depression 01/06/2011  . GERD (gastroesophageal reflux disease) 02/17/2011  . Trichotillomania     h/o  . Blood transfusion     "lots"  . Sickle cell anemia with crisis   . Exertional dyspnea     "sometimes"  . Sickle cell anemia   . Headache(784.0)   . Migraines 11/08/11    "@ least twice/month"  . Chronic back pain     "very severe; have knot in my back; from tight muscle; take RX and exercise for it"  .  Mood swings 11/08/11    "I go back and forth; real bad"  . Pregnancy   . Blood dyscrasia     SICKLE CELL   Past Surgical History  Procedure Laterality Date  . Cholecystectomy  05/2010  . Dilation and curettage of uterus  02/20/11    S/P miscarriage   Family History  Problem Relation Age of Onset  . Diabetes Mother   . Alcoholism    . Depression    . Hypercholesterolemia    . Hypertension    . Migraines    . Diabetes Maternal Grandmother   . Diabetes Paternal Grandmother    History   Social History  . Marital Status: Single    Spouse Name: N/A    Number of Children: N/A  . Years of Education: N/A   Occupational History  . Not on file.   Social History Main Topics  . Smoking status: Former Smoker -- 0.25 packs/day for 1 years    Types: Cigarettes    Quit date: 03/25/2013  . Smokeless tobacco: Never Used  . Alcohol Use: Yes     Comment: pt states she quit marijuan in May 2013. Rare ETOH, + cigarettes.  She is enrolled in school  . Drug Use: No  . Sexual Activity: Not Currently    Birth Control/ Protection: None   Other Topics Concern  . Not on file   Social History Narrative   Lives  [place]  mother   FOB is supportive-supposed to be moving next month   Is a student at [employer] time    Review of Systems: Constitutional: Positive for fatigue and low grade fever HENT: Positive for sore throat.  Respiratory: Negative.  Cardiovascular: Negative.  Gastrointestinal: Negative.  Endocrine: Negative.  Genitourinary: Negative.  Musculoskeletal: Positive for myalgias (right wrist pain) and back pain.  Allergic/Immunologic: Negative.  Neurological: Negative.  Hematological: Negative.  Psychiatric/Behavioral: Negative.  Physical Exam: Last menstrual period 03/20/2014.  Physical Exam  Constitutional: She appears well-developed. She has a sickly appearance.  HENT:  Head: Normocephalic and atraumatic.  Right Ear: Hearing, tympanic membrane, external ear and  ear canal normal.  Left Ear: Hearing, tympanic membrane, external ear and ear canal normal.  Eyes: Lids are everted and swept, no foreign bodies found.  Neck: Trachea normal, normal range of motion and full passive range of motion without pain. Neck supple.  Cardiovascular: Normal rate and regular rhythm.  Pulmonary/Chest: Breath sounds normal.  Abdominal: Soft. Normal appearance. Bowel sounds are decreased.  Musculoskeletal:   Right wrist: She exhibits tenderness. She exhibits normal range of motion and no swelling.   Thoracic back: She exhibits decreased range of motion, tenderness and pain. She exhibits no swelling.  Lymphadenopathy:   Head (left side): Submental and submandibular adenopathy present. No preauricular and no posterior auricular adenopathy present.  Skin: Skin is warm, dry and intact.  Psychiatric: She has a normal mood and affect. Her speech is normal and behavior is normal. Judgment and thought content normal. Cognition and memory are normal.    Lab results: Results for orders placed or performed during the hospital encounter of 04/29/14 (from the past 24 hour(s))  Comprehensive metabolic panel     Status: Abnormal   Collection Time: 04/29/14  3:39 PM  Result Value Ref Range   Sodium 129 (L) 137 - 147 mEq/L   Potassium 3.5 (L) 3.7 - 5.3 mEq/L   Chloride 95 (L) 96 - 112 mEq/L   CO2 22 19 - 32 mEq/L   Glucose, Bld 99 70 - 99 mg/dL   BUN 4 (L) 6 - 23 mg/dL   Creatinine, Ser 0.67 0.50 - 1.10 mg/dL   Calcium 9.3 8.4 - 10.5 mg/dL   Total Protein 7.6 6.0 - 8.3 g/dL   Albumin 3.8 3.5 - 5.2 g/dL   AST 14 0 - 37 U/L   ALT 9 0 - 35 U/L   Alkaline Phosphatase 58 39 - 117 U/L   Total Bilirubin 1.2 0.3 - 1.2 mg/dL   GFR calc non Af Amer >90 >90 mL/min   GFR calc Af Amer >90 >90 mL/min   Anion gap 12 5 - 15  CBC with Differential     Status: Abnormal   Collection Time: 04/29/14  3:39 PM  Result Value Ref Range   WBC 15.7 (H) 4.0 - 10.5 K/uL   RBC 2.67 (L)  3.87 - 5.11 MIL/uL   Hemoglobin 8.6 (L) 12.0 - 15.0 g/dL   HCT 24.8 (L) 36.0 - 46.0 %   MCV 92.9 78.0 - 100.0 fL   MCH 32.2 26.0 - 34.0 pg   MCHC 34.7 30.0 - 36.0 g/dL   RDW 17.2 (H) 11.5 - 15.5 %   Platelets 737 (H) 150 - 400 K/uL   Neutrophils Relative % 80 (H) 43 - 77 %   Neutro Abs 12.7 (H) 1.7 - 7.7 K/uL   Lymphocytes Relative 8 (L) 12 - 46 %   Lymphs Abs 1.2 0.7 -  4.0 K/uL   Monocytes Relative 11 3 - 12 %   Monocytes Absolute 1.7 (H) 0.1 - 1.0 K/uL   Eosinophils Relative 1 0 - 5 %   Eosinophils Absolute 0.1 0.0 - 0.7 K/uL   Basophils Relative 0 0 - 1 %   Basophils Absolute 0.1 0.0 - 0.1 K/uL  Reticulocytes     Status: Abnormal   Collection Time: 04/29/14  3:39 PM  Result Value Ref Range   Retic Ct Pct 5.0 (H) 0.4 - 3.1 %   RBC. 2.67 (L) 3.87 - 5.11 MIL/uL   Retic Count, Manual 133.5 19.0 - 186.0 K/uL    Imaging results:  No results found.   Assessment & Plan: Will start hypotonic IV fluids for cellular rehydration Start IV dilaudid per weight based rapid re-dosing for pain management. Will monitor pain intensity closely throughout observation.  Will apply K-pad to mid back as needed.  Will check CBCw/diff, reticulocyte count, and CMP Rillie Riffel M 04/29/2014, 7:40 PM

## 2014-04-29 NOTE — Progress Notes (Signed)
Subjective:    Patient ID: [name], female    DOB: 12/31/1990, 23 y.o.   MRN: [id number]  HPI Ms. Adduci is a 23 year old patient with a history of sickle cell anemia, HbSS and is [redacted] weeks pregnant. She states that she woke up feeling increased fatigue. She thought that it was morning sickness and went to work. After being at work for 1 hour, she felt increased back and wrist pain. She states that pain intensity is 7/10 to back and right wrist. She states that she last had Oxycodone 15 mg at 11 am with minimal relief. She reports that she increased hydration and slept for 3 hours, but awakened feeling hot with increased throat pain. She reports that she was treated for strep throat in the hospital 2 weeks ago.   Past Medical History  Diagnosis Date  . H/O: 1 miscarriage 03/22/2011    Pt reports 2 miscarriages.  [name] 01/08/2009  . Depression 01/06/2011  . GERD (gastroesophageal reflux disease) 02/17/2011  . Trichotillomania     h/o  . Blood transfusion     "lots"  . Sickle cell anemia with crisis   . Exertional dyspnea     "sometimes"  . Sickle cell anemia   . Headache(784.0)   . Migraines 11/08/11    "@ least twice/month"  . Chronic back pain     "very severe; have knot in my back; from tight muscle; take RX and exercise for it"  . Mood swings 11/08/11    "I go back and forth; real bad"  . Pregnancy   . Blood dyscrasia     SICKLE CELL     Review of Systems  Constitutional: Positive for fever.  HENT: Positive for sore throat.   Respiratory: Negative.   Cardiovascular: Negative.   Gastrointestinal: Negative.   Endocrine: Negative.   Genitourinary: Negative.   Musculoskeletal: Positive for myalgias (right wrist pain) and back pain.  Allergic/Immunologic: Negative.   Neurological: Negative.   Hematological: Negative.   Psychiatric/Behavioral: Negative.        Objective:   Physical Exam  Constitutional: She appears well-developed. She has a sickly  appearance.  HENT:  Head: Normocephalic and atraumatic.  Right Ear: Hearing, tympanic membrane, external ear and ear canal normal.  Left Ear: Hearing, tympanic membrane, external ear and ear canal normal.  Mouth/Throat: Mucous membranes are dry. Posterior oropharyngeal edema and posterior oropharyngeal erythema (primarily of left side) present. No oropharyngeal exudate.    Eyes: Lids are everted and swept, no foreign bodies found.  Neck: Trachea normal, normal range of motion and full passive range of motion without pain. Neck supple.  Cardiovascular: Normal rate and regular rhythm.   Pulmonary/Chest: Breath sounds normal.  Abdominal: Soft. Normal appearance. Bowel sounds are decreased.  Musculoskeletal:       Right wrist: She exhibits tenderness. She exhibits normal range of motion and no swelling.       Thoracic back: She exhibits decreased range of motion, tenderness and pain. She exhibits no swelling.  Lymphadenopathy:       Head (left side): Submental and submandibular adenopathy present. No preauricular and no posterior auricular adenopathy present.  Skin: Skin is warm, dry and intact.  Psychiatric: She has a normal mood and affect. Her speech is normal and behavior is normal. Judgment and thought content normal. Cognition and memory are normal.         BP 118/67 mmHg  Pulse 114  Temp(Src) 99.4 F (37.4 C) (Oral)  Resp 16  Ht 5\' 2"  (1.575 m)  Wt 162 lb (73.483 kg)  BMI 29.62 kg/m2  LMP 03/20/2014 Assessment & Plan:   1. Sickle cell anemia with crisis Ms. Smyre currently has 7/10 pain to mid back, along thoracic spine and right wrist pain. Patient appears to be in mild distress. Will transition to day infusion center for extended observation. I will start hypotonic IV fluids and IV dilaudid per weight based rapid re-dosing protocol.   2. Low grade fever Patient has a low grade temperature. She did not attempt OTC interventions due to pregnancy. We discussed the fact that  she can take Tylenol as directed for fever, headache, and mild pain.  3. Sore throat Patient was previously treated with ampicilliin and Bicillin injection prior to discharge. - Rapid strep screen   4. Streptococcal sore throat Ms. Chenevert was treated with Ampicillin on 04/16/2014 and a Bicillin injection prior to hospital discharge on 04/18/2014. Patient has a positive Strep A screen. I notified Dr. Linus Salmons, [facility], who states that patient is most likely colonized and will have positive strep test. He recommended treating her with bicillin.  - penicillin g benzathine (BICILLIN LA) 1200000 UNIT/2ML injection 2.4 Million Units; Inject 4 mLs (2.4 Million Units total) into the muscle once.  5. Pregnancy at early stage Reviewed quantitative HCG from 6 days ago, patient is around [redacted] weeks pregnant. She is currently taking prenatal vitamins and folic acid. Referral sent to [facility] on 04/23/2014    RTC: as scheduled Dorena Dew, FNP

## 2014-04-29 NOTE — Discharge Summary (Signed)
[facility] Discharge Summary   Patient ID: [name] MRN: [id number] DOB/AGE: Oct 15, 1990 23 y.o.  Admit date: 04/29/2014 Discharge date: 04/29/2014  Primary Care Physician:  MATTHEWS,MICHELLE A., MD  Admission Diagnoses:   Active Problems:   * No active hospital problems. *   Discharge Diagnoses:   Sickle cell anemia  Discharge Medications:    Medication List    ASK your doctor about these medications        alprazolam 2 MG tablet  Commonly known as:  XANAX  Take 1 tablet by mouth 2 (two) times daily as needed. Anxiety     folic acid 1 MG tablet  Commonly known as:  FOLVITE  Take 2 tablets (2 mg total) by mouth daily.     hydroxyurea 500 MG capsule  Commonly known as:  HYDREA  Take 3 capsules (1,500 mg total) by mouth daily. May take with food to minimize GI side effects.     methadone 5 MG tablet  Commonly known as:  DOLOPHINE  Take 1 tablet (5 mg total) by mouth at bedtime.     Oxycodone HCl 10 MG Tabs  Take 1.5 tablets (15 mg total) by mouth every 6 (six) hours as needed (moderate pain).     sulindac 150 MG tablet  Commonly known as:  CLINORIL  Take 1 tablet (150 mg total) by mouth 2 (two) times daily as needed (Pain in right thigh).         Consults:  None  Significant Diagnostic Studies:  Dg Neck Soft Tissue  04/16/2014   CLINICAL DATA:  Stridor.  Group a strep.  Initial encounter.  EXAM: NECK SOFT TISSUES - 1+ VIEW  COMPARISON:  None.  FINDINGS: Levoconvex torticollis is present. The prevertebral soft tissues appear within normal limits. The epiglottis is normal.  IMPRESSION: 1. Normal epiglottis. 2. Mild levoconvex torticollis.   Electronically Signed   By: Dereck Ligas M.D.   On: 04/16/2014 14:58   Dg Chest 2 View  04/15/2014   CLINICAL DATA:  Cough, fever, history of pneumonia and strep throat, sickle cell disease  EXAM: CHEST  2 VIEW  COMPARISON:  04/14/2014  FINDINGS: RIGHT arm PICC line with tip projecting over mid SVC.   Enlargement of cardiac silhouette with pulmonary vascular congestion.  RIGHT upper lobe volume loss.  No gross infiltrate, pleural effusion, or pneumothorax.  Bones unremarkable.  IMPRESSION: Enlargement of cardiac silhouette with pulmonary vascular congestion consistent with history of sickle cell disease.  No acute infiltrate.   Electronically Signed   By: Lavonia Dana M.D.   On: 04/15/2014 15:25   US Abdomen Limited  04/16/2014   CLINICAL DATA:  Abdominal wall abscess. Sickle cell crisis. Abdominal wall pain.  EXAM: LIMITED ABDOMINAL ULTRASOUND  COMPARISON:  None.  FINDINGS: Scanning was performed over the area of pain. There was no abscess. No subcutaneous edema was identified. The abdominal wall appears normal.  IMPRESSION: Negative.   Electronically Signed   By: Dereck Ligas M.D.   On: 04/16/2014 14:44   Ir Fluoro Guide Cv Line Right  04/14/2014   CLINICAL DATA:  Sickle cell crisis, acute pain crisis, no current access  EXAM: POWER PICC LINE PLACEMENT WITH ULTRASOUND AND FLUOROSCOPIC GUIDANCE  FLUOROSCOPY TIME:  12 seconds  PROCEDURE: The patient was advised of the possible risks andcomplications and agreed to undergo the procedure. The patient was then brought to the angiographic suite for the procedure.  The right arm was prepped with chlorhexidine, drapedin the  usual sterile fashion using maximum barrier technique (cap and mask, sterile gown, sterile gloves, large sterile sheet, hand hygiene and cutaneous antisepsis) and infiltrated locally with 1% Lidocaine.  Ultrasound demonstrated patency of the right brachial vein, and this was documented with an image. Under real-time ultrasound guidance, this vein was accessed with a 21 gauge micropuncture needle and image documentation was performed. A 0.018 wire was introduced in to the vein. Over this, a 5 [place] double lumen power PICC was advanced to the lower SVC/right atrial junction. Fluoroscopy during the procedure and fluoro spot radiograph  confirms appropriate catheter position. The catheter was flushed and covered with asterile dressing.  Catheter length: 38  Complications: None immediate  IMPRESSION: Successful right arm power PICC line placement with ultrasound and fluoroscopic guidance. The catheter is ready for use.   Electronically Signed   By: Daryll Brod M.D.   On: 04/14/2014 08:18   Ir US Guide Vasc Access Right  04/14/2014   CLINICAL DATA:  Sickle cell crisis, acute pain crisis, no current access  EXAM: POWER PICC LINE PLACEMENT WITH ULTRASOUND AND FLUOROSCOPIC GUIDANCE  FLUOROSCOPY TIME:  12 seconds  PROCEDURE: The patient was advised of the possible risks andcomplications and agreed to undergo the procedure. The patient was then brought to the angiographic suite for the procedure.  The right arm was prepped with chlorhexidine, drapedin the usual sterile fashion using maximum barrier technique (cap and mask, sterile gown, sterile gloves, large sterile sheet, hand hygiene and cutaneous antisepsis) and infiltrated locally with 1% Lidocaine.  Ultrasound demonstrated patency of the right brachial vein, and this was documented with an image. Under real-time ultrasound guidance, this vein was accessed with a 21 gauge micropuncture needle and image documentation was performed. A 0.018 wire was introduced in to the vein. Over this, a 5 [place] double lumen power PICC was advanced to the lower SVC/right atrial junction. Fluoroscopy during the procedure and fluoro spot radiograph confirms appropriate catheter position. The catheter was flushed and covered with asterile dressing.  Catheter length: 38  Complications: None immediate  IMPRESSION: Successful right arm power PICC line placement with ultrasound and fluoroscopic guidance. The catheter is ready for use.   Electronically Signed   By: Daryll Brod M.D.   On: 04/14/2014 08:18   Dg Chest Port 1 View  04/14/2014   CLINICAL DATA:  Fever.  Sickle cell.  EXAM: PORTABLE CHEST - 1 VIEW   COMPARISON:  02/16/2014  FINDINGS: Bilateral airspace disease has developed since the prior study and may represent pneumonia. Atelectasis and fluid overload also possible. No effusion.  Cardiac enlargement.  Right arm PICC tip in the SVC  Bony changes of sickle cell.  IMPRESSION: Diffuse bilateral airspace disease may represent pneumonia or edema.   Electronically Signed   By: Franchot Gallo M.D.   On: 04/14/2014 07:39     [facility] Course: Patient was transitioned to day infusion center for sickle cell anemia with pain. [name] was started on hypotonic IV fluid for cellular re-hydration.  Acute pain was managed with IV dilaudid per weight based rapid redosing. Also, [name] was given Oxycodone 15 mg po 45 minutes prior to discharge. She states that her pain intensity has decreased to 4/10. She was given home Rx per weekly pain management agreement. Reviewed [place] [facility] Reporting system prior to reordering home medications. Patient is alert, oriented, and ambulatory. She was discharged home in stable condition. She has a referral in progress to Ob-gyn and she is  scheduled to follow up in clinic.    Physical Exam at Discharge:  LMP 03/20/2014   Disposition at Discharge: 01-Home or Self Care  Discharge Orders:   Condition at Discharge:   Stable  Time spent on Discharge:  Greater than 30 minutes.  Signed: Hollis,Lachina M 04/29/2014, 7:37 PM

## 2014-04-29 NOTE — Telephone Encounter (Signed)
Called patient to advise that per Dr. Zigmund Daniel, she needs to be seen in the doctor's office.  Offered the first available appointment for today at 1:15.  Patient states that her case manager was on the way to bring her to the day hospital.  I explained that the first appointment in the office would be at 1:15.  Patient states she would not have a ride at that time, so she would just go to the emergency room.

## 2014-04-29 NOTE — Progress Notes (Signed)
Patient ID: [name], female   DOB: 1991/01/27, 23 y.o.   MRN: [id number] Discharge instructions given to patient, along with follow up information.  Patient states pain is now down to a 4 at discharge.

## 2014-04-29 NOTE — Telephone Encounter (Signed)
Encounter opened in error

## 2014-05-02 ENCOUNTER — Inpatient Hospital Stay (HOSPITAL_COMMUNITY)
Admission: EM | Admit: 2014-05-02 | Discharge: 2014-05-06 | DRG: 781 | Disposition: A | Discharge status: Home / Self Care | Payer: Medicare Other | Attending: Internal Medicine | Admitting: Internal Medicine

## 2014-05-02 ENCOUNTER — Inpatient Hospital Stay (HOSPITAL_COMMUNITY): Payer: Medicare Other

## 2014-05-02 ENCOUNTER — Encounter (HOSPITAL_COMMUNITY): Payer: Self-pay | Admitting: Emergency Medicine

## 2014-05-02 DIAGNOSIS — E871 Hypo-osmolality and hyponatremia: Secondary | ICD-10-CM | POA: Diagnosis present

## 2014-05-02 DIAGNOSIS — K219 Gastro-esophageal reflux disease without esophagitis: Secondary | ICD-10-CM | POA: Diagnosis present

## 2014-05-02 DIAGNOSIS — Z8249 Family history of ischemic heart disease and other diseases of the circulatory system: Secondary | ICD-10-CM

## 2014-05-02 DIAGNOSIS — R1310 Dysphagia, unspecified: Secondary | ICD-10-CM | POA: Diagnosis present

## 2014-05-02 DIAGNOSIS — M549 Dorsalgia, unspecified: Secondary | ICD-10-CM | POA: Diagnosis present

## 2014-05-02 DIAGNOSIS — J351 Hypertrophy of tonsils: Secondary | ICD-10-CM | POA: Diagnosis not present

## 2014-05-02 DIAGNOSIS — Z3A01 Less than 8 weeks gestation of pregnancy: Secondary | ICD-10-CM | POA: Diagnosis present

## 2014-05-02 DIAGNOSIS — R509 Fever, unspecified: Secondary | ICD-10-CM | POA: Diagnosis not present

## 2014-05-02 DIAGNOSIS — O99011 Anemia complicating pregnancy, first trimester: Secondary | ICD-10-CM | POA: Diagnosis present

## 2014-05-02 DIAGNOSIS — I878 Other specified disorders of veins: Secondary | ICD-10-CM

## 2014-05-02 DIAGNOSIS — F329 Major depressive disorder, single episode, unspecified: Secondary | ICD-10-CM | POA: Diagnosis present

## 2014-05-02 DIAGNOSIS — Z87891 Personal history of nicotine dependence: Secondary | ICD-10-CM | POA: Diagnosis not present

## 2014-05-02 DIAGNOSIS — E860 Dehydration: Secondary | ICD-10-CM

## 2014-05-02 DIAGNOSIS — J0300 Acute streptococcal tonsillitis, unspecified: Secondary | ICD-10-CM | POA: Diagnosis not present

## 2014-05-02 DIAGNOSIS — G8929 Other chronic pain: Secondary | ICD-10-CM

## 2014-05-02 DIAGNOSIS — Z833 Family history of diabetes mellitus: Secondary | ICD-10-CM | POA: Diagnosis not present

## 2014-05-02 DIAGNOSIS — Z3490 Encounter for supervision of normal pregnancy, unspecified, unspecified trimester: Secondary | ICD-10-CM

## 2014-05-02 DIAGNOSIS — J029 Acute pharyngitis, unspecified: Secondary | ICD-10-CM | POA: Diagnosis not present

## 2014-05-02 DIAGNOSIS — B950 Streptococcus, group A, as the cause of diseases classified elsewhere: Secondary | ICD-10-CM | POA: Diagnosis not present

## 2014-05-02 DIAGNOSIS — J020 Streptococcal pharyngitis: Secondary | ICD-10-CM

## 2014-05-02 DIAGNOSIS — G43909 Migraine, unspecified, not intractable, without status migrainosus: Secondary | ICD-10-CM | POA: Diagnosis present

## 2014-05-02 DIAGNOSIS — Z331 Pregnant state, incidental: Secondary | ICD-10-CM | POA: Diagnosis not present

## 2014-05-02 DIAGNOSIS — R590 Localized enlarged lymph nodes: Secondary | ICD-10-CM | POA: Diagnosis not present

## 2014-05-02 DIAGNOSIS — E876 Hypokalemia: Secondary | ICD-10-CM | POA: Diagnosis present

## 2014-05-02 DIAGNOSIS — D5700 Hb-SS disease with crisis, unspecified: Secondary | ICD-10-CM

## 2014-05-02 DIAGNOSIS — D571 Sickle-cell disease without crisis: Secondary | ICD-10-CM

## 2014-05-02 DIAGNOSIS — D72829 Elevated white blood cell count, unspecified: Secondary | ICD-10-CM

## 2014-05-02 LAB — COMPREHENSIVE METABOLIC PANEL
ALK PHOS: 102 U/L (ref 39–117)
ALT: 55 U/L — ABNORMAL HIGH (ref 0–35)
AST: 59 U/L — ABNORMAL HIGH (ref 0–37)
Albumin: 3.8 g/dL (ref 3.5–5.2)
Anion gap: 17 — ABNORMAL HIGH (ref 5–15)
BUN: 6 mg/dL (ref 6–23)
CHLORIDE: 92 meq/L — AB (ref 96–112)
CO2: 21 mEq/L (ref 19–32)
Calcium: 9.5 mg/dL (ref 8.4–10.5)
Creatinine, Ser: 0.72 mg/dL (ref 0.50–1.10)
GFR calc non Af Amer: >90 mL/min (ref >90)
GLUCOSE: 95 mg/dL (ref 70–99)
POTASSIUM: 3.7 meq/L (ref 3.7–5.3)
Sodium: 130 mEq/L — ABNORMAL LOW (ref 137–147)
TOTAL PROTEIN: 8.3 g/dL (ref 6.0–8.3)
Total Bilirubin: 1.6 mg/dL — ABNORMAL HIGH (ref 0.3–1.2)

## 2014-05-02 LAB — URINE MICROSCOPIC-ADD ON

## 2014-05-02 LAB — CBC WITH DIFFERENTIAL/PLATELET
BASOS PCT: 0 % (ref 0–1)
Basophils Absolute: 0 10*3/uL (ref 0.0–0.1)
EOS ABS: 0 10*3/uL (ref 0.0–0.7)
EOS PCT: 0 % (ref 0–5)
HEMATOCRIT: 26.6 % — AB (ref 36.0–46.0)
HEMOGLOBIN: 9.1 g/dL — AB (ref 12.0–15.0)
LYMPHS ABS: 1.8 10*3/uL (ref 0.7–4.0)
Lymphocytes Relative: 8 % — ABNORMAL LOW (ref 12–46)
MCH: 30.8 pg (ref 26.0–34.0)
MCHC: 34.2 g/dL (ref 30.0–36.0)
MCV: 90.2 fL (ref 78.0–100.0)
MONO ABS: 1.8 10*3/uL — AB (ref 0.1–1.0)
Monocytes Relative: 8 % (ref 3–12)
NEUTROS ABS: 19.1 10*3/uL — AB (ref 1.7–7.7)
Neutrophils Relative %: 84 % — ABNORMAL HIGH (ref 43–77)
Platelets: 363 10*3/uL (ref 150–400)
RBC: 2.95 MIL/uL — AB (ref 3.87–5.11)
RDW: 17.8 % — ABNORMAL HIGH (ref 11.5–15.5)
WBC: 22.7 10*3/uL — ABNORMAL HIGH (ref 4.0–10.5)

## 2014-05-02 LAB — URINALYSIS, ROUTINE W REFLEX MICROSCOPIC
Bilirubin Urine: NEGATIVE
Glucose, UA: NEGATIVE mg/dL
HGB URINE DIPSTICK: NEGATIVE
Ketones, ur: NEGATIVE mg/dL
Nitrite: NEGATIVE
PH: 6.5 (ref 5.0–8.0)
Protein, ur: 30 mg/dL — AB
Specific Gravity, Urine: 1.014 (ref 1.005–1.030)
Urobilinogen, UA: 1 mg/dL (ref 0.0–1.0)

## 2014-05-02 LAB — RETICULOCYTES
RBC.: 2.95 MIL/uL — ABNORMAL LOW (ref 3.87–5.11)
Retic Count, Absolute: 209.5 10*3/uL — ABNORMAL HIGH (ref 19.0–186.0)
Retic Ct Pct: 7.1 % — ABNORMAL HIGH (ref 0.4–3.1)

## 2014-05-02 LAB — LIPASE, BLOOD: Lipase: 35 U/L (ref 11–59)

## 2014-05-02 LAB — MAGNESIUM: Magnesium: 1.9 mg/dL (ref 1.5–2.5)

## 2014-05-02 LAB — LACTATE DEHYDROGENASE: LDH: 228 U/L (ref 94–250)

## 2014-05-02 LAB — I-STAT CG4 LACTIC ACID, ED: Lactic Acid, Venous: 0.62 mmol/L (ref 0.5–2.2)

## 2014-05-02 MED ORDER — SENNOSIDES-DOCUSATE SODIUM 8.6-50 MG PO TABS
1.0000 | ORAL_TABLET | Freq: Two times a day (BID) | ORAL | Status: DC
  Administered 2014-05-02 – 2014-05-06 (×2): 1 via ORAL
  Filled 2014-05-02 (×10): qty 1

## 2014-05-02 MED ORDER — PRENATAL MULTIVITAMIN CH
1.0000 | ORAL_TABLET | Freq: Every day | ORAL | Status: DC
  Administered 2014-05-03 – 2014-05-06 (×4): 1 via ORAL
  Filled 2014-05-02 (×6): qty 1

## 2014-05-02 MED ORDER — METHADONE HCL 5 MG PO TABS
5.0000 mg | ORAL_TABLET | Freq: Every day | ORAL | Status: DC
  Administered 2014-05-03 – 2014-05-05 (×3): 5 mg via ORAL
  Filled 2014-05-02 (×4): qty 1

## 2014-05-02 MED ORDER — SODIUM CHLORIDE 0.9 % IV SOLN
2.0000 g | Freq: Four times a day (QID) | INTRAVENOUS | Status: DC
  Administered 2014-05-02 – 2014-05-06 (×15): 2 g via INTRAVENOUS
  Filled 2014-05-02 (×19): qty 2000

## 2014-05-02 MED ORDER — NALOXONE HCL 0.4 MG/ML IJ SOLN: 0.4000 mg | INTRAMUSCULAR | Status: DC | PRN

## 2014-05-02 MED ORDER — ONDANSETRON HCL 4 MG/2ML IJ SOLN
4.0000 mg | Freq: Once | INTRAMUSCULAR | Status: AC
  Administered 2014-05-02: 4 mg via INTRAVENOUS
  Filled 2014-05-02: qty 2

## 2014-05-02 MED ORDER — ONDANSETRON HCL 4 MG/2ML IJ SOLN
4.0000 mg | Freq: Four times a day (QID) | INTRAMUSCULAR | Status: DC | PRN
  Administered 2014-05-02: 4 mg via INTRAVENOUS
  Filled 2014-05-02: qty 2

## 2014-05-02 MED ORDER — SODIUM CHLORIDE 0.9 % IV BOLUS (SEPSIS)
1000.0000 mL | Freq: Once | INTRAVENOUS | Status: AC
  Administered 2014-05-02: 1000 mL via INTRAVENOUS

## 2014-05-02 MED ORDER — PENICILLIN G BENZATHINE 1200000 UNIT/2ML IM SUSP
1.2000 10*6.[IU] | Freq: Once | INTRAMUSCULAR | Status: AC
Start: 2014-05-02 — End: 2014-05-02
  Administered 2014-05-02: 1.2 10*6.[IU] via INTRAMUSCULAR
  Filled 2014-05-02: qty 2

## 2014-05-02 MED ORDER — HYDROMORPHONE 2 MG/ML HIGH CONCENTRATION IV PCA SOLN
INTRAVENOUS | Status: DC
  Administered 2014-05-02: 15:00:00 via INTRAVENOUS
  Administered 2014-05-02: 3.5 mg via INTRAVENOUS
  Administered 2014-05-03: 5.6 mg via INTRAVENOUS
  Administered 2014-05-03: 2.8 mg via INTRAVENOUS
  Administered 2014-05-03: 1.4 mg via INTRAVENOUS
  Administered 2014-05-03: 4.2 mg via INTRAVENOUS
  Administered 2014-05-03: 1.4 mg via INTRAVENOUS
  Administered 2014-05-03: 11.2 mg via INTRAVENOUS
  Administered 2014-05-04: 7 mg via INTRAVENOUS
  Administered 2014-05-04: 6.3 mg via INTRAVENOUS
  Administered 2014-05-04: 2.8 mg via INTRAVENOUS
  Administered 2014-05-04: 4.2 mg via INTRAVENOUS
  Administered 2014-05-04: 02:00:00 via INTRAVENOUS
  Administered 2014-05-05: 0.7 mg via INTRAVENOUS
  Administered 2014-05-05: 5.6 mg via INTRAVENOUS
  Administered 2014-05-05: 2.8 mg via INTRAVENOUS
  Filled 2014-05-02 (×3): qty 25

## 2014-05-02 MED ORDER — POLYETHYLENE GLYCOL 3350 17 G PO PACK
17.0000 g | PACK | Freq: Every day | ORAL | Status: DC | PRN
  Filled 2014-05-02: qty 1

## 2014-05-02 MED ORDER — HYDROMORPHONE HCL 2 MG/ML IJ SOLN
2.0000 mg | Freq: Once | INTRAMUSCULAR | Status: AC
  Administered 2014-05-02: 2 mg via INTRAVENOUS
  Filled 2014-05-02: qty 1

## 2014-05-02 MED ORDER — HYDROMORPHONE HCL 2 MG/ML IJ SOLN
2.5000 mg | Freq: Once | INTRAMUSCULAR | Status: AC
  Administered 2014-05-02: 2.5 mg via INTRAVENOUS
  Filled 2014-05-02: qty 2

## 2014-05-02 MED ORDER — HYDROMORPHONE HCL 2 MG/ML IJ SOLN
3.0000 mg | Freq: Once | INTRAMUSCULAR | Status: AC
  Administered 2014-05-02: 3 mg via INTRAVENOUS
  Filled 2014-05-02: qty 2

## 2014-05-02 MED ORDER — SODIUM CHLORIDE 0.9 % IV SOLN
INTRAVENOUS | Status: DC
  Administered 2014-05-02 – 2014-05-05 (×4): via INTRAVENOUS

## 2014-05-02 MED ORDER — DIPHENHYDRAMINE HCL 25 MG PO CAPS
25.0000 mg | ORAL_CAPSULE | Freq: Four times a day (QID) | ORAL | Status: DC | PRN
  Administered 2014-05-02: 25 mg via ORAL
  Filled 2014-05-02: qty 1

## 2014-05-02 MED ORDER — DEXTROSE-NACL 5-0.45 % IV SOLN
INTRAVENOUS | Status: DC
  Administered 2014-05-02: 15:00:00 via INTRAVENOUS

## 2014-05-02 MED ORDER — DIPHENHYDRAMINE HCL 25 MG PO CAPS: 25.0000 mg | ORAL_CAPSULE | Freq: Four times a day (QID) | ORAL | Status: DC | PRN

## 2014-05-02 MED ORDER — ENOXAPARIN SODIUM 40 MG/0.4ML ~~LOC~~ SOLN
40.0000 mg | SUBCUTANEOUS | Status: DC
  Administered 2014-05-03 – 2014-05-05 (×3): 40 mg via SUBCUTANEOUS
  Filled 2014-05-02 (×6): qty 0.4

## 2014-05-02 MED ORDER — HYDROMORPHONE HCL 1 MG/ML IJ SOLN
1.0000 mg | Freq: Once | INTRAMUSCULAR | Status: AC
  Administered 2014-05-02: 1 mg via INTRAVENOUS
  Filled 2014-05-02: qty 1

## 2014-05-02 MED ORDER — SODIUM CHLORIDE 0.9 % IJ SOLN: 9.0000 mL | INTRAMUSCULAR | Status: DC | PRN

## 2014-05-02 MED ORDER — FOLIC ACID 1 MG PO TABS
2.0000 mg | ORAL_TABLET | Freq: Every day | ORAL | Status: DC
  Administered 2014-05-02 – 2014-05-06 (×5): 2 mg via ORAL
  Filled 2014-05-02 (×5): qty 2

## 2014-05-02 NOTE — ED Notes (Signed)
Patient transported to MRI 

## 2014-05-02 NOTE — ED Notes (Signed)
MD at bedside. 

## 2014-05-02 NOTE — ED Notes (Signed)
Pt states she was treated at the [facility] cell center 2 days ago for strep throat  Pt states she was given a shot of penicillin, pain meds, and tylenol  Pt states she is not feeling better and is now having pain in her neck and posterior rib cage  Pt states for the past couple of days she has had decreased appetite, sleeping a lot and has had some vomiting

## 2014-05-02 NOTE — ED Notes (Signed)
Pt returned from MRI °

## 2014-05-02 NOTE — H&P (Signed)
Hospital Admission Note Date: 05/02/2014  Patient name: [name] Medical record number: [id number] Date of birth: 09-04-1990 Age: 23 y.o. Gender: female PCP: MATTHEWS,MICHELLE A., MD  Attending physician: [name], MD  Chief Complaint: Pain x 11/2 day and sore throat with drooling.  History of Present Illness: Pt well known with Hb SS and recent Group A strept infection presents to the ED with c/o throat pain and drooling x 11/2 days. She was treated with IM Bicillin in the office on 04/29/2014 after she had received a course of amoxicillin and depot bicillin about 2 weeks ago. This was discussed with ID  (Dr. Linus Salmons) who recommended re-treating with depot Bicillin. She comes in today with complaints of difficulty swallowing and drooling and foul smelling breath. She states that this triggered a crisis and she is now having pain typical of her SCD type pain. The pain is localized to the back and legs. She reports that she had one episode of emesis last night. However although she has not had significant emesis, she has not had much oral intake as it hurts to drink. She denies a fever but states that she has had chills. She denies any diarrhea, SOB, Abdominal pain. Of note, the patient is pregnant of about 6 weeks.  She reports that she came to the ED as her mother had to leave early for work and she would not otherwise have had a way to seek medical care.   Scheduled Meds: . HYDROmorphone PCA 2 mg/mL   Intravenous 6 times per day  .  HYDROmorphone (DILAUDID) injection  2.5 mg Intravenous Once   Followed by  .  HYDROmorphone (DILAUDID) injection  3 mg Intravenous Once   . sodium chloride       PRN Meds:.diphenhydrAMINE  Allergies: Carrot oil and Latex Past Medical History  Diagnosis Date  . H/O: 1 miscarriage 03/22/2011    Pt reports 2 miscarriages.  [name] 01/08/2009  . Depression 01/06/2011  . GERD (gastroesophageal reflux disease) 02/17/2011  .  Trichotillomania     h/o  . Blood transfusion     "lots"  . Sickle cell anemia with crisis   . Exertional dyspnea     "sometimes"  . Sickle cell anemia   . Headache(784.0)   . Migraines 11/08/11    "@ least twice/month"  . Chronic back pain     "very severe; have knot in my back; from tight muscle; take RX and exercise for it"  . Mood swings 11/08/11    "I go back and forth; real bad"  . Pregnancy   . Blood dyscrasia     SICKLE CELL   Past Surgical History  Procedure Laterality Date  . Cholecystectomy  05/2010  . Dilation and curettage of uterus  02/20/11    S/P miscarriage   Family History  Problem Relation Age of Onset  . Diabetes Mother   . Alcoholism    . Depression    . Hypercholesterolemia    . Hypertension    . Migraines    . Diabetes Maternal Grandmother   . Diabetes Paternal Grandmother    History   Social History  . Marital Status: Single    Spouse Name: N/A    Number of Children: N/A  . Years of Education: N/A   Occupational History  . Not on file.   Social History Main Topics  . Smoking status: Former Smoker -- 0.25 packs/day for 1 years    Types: Cigarettes  Quit date: 03/25/2013  . Smokeless tobacco: Never Used  . Alcohol Use: No     Comment: pt states she quit marijuan in May 2013. Rare ETOH, + cigarettes.  She is enrolled in school  . Drug Use: No  . Sexual Activity: Yes    Birth Control/ Protection: None   Other Topics Concern  . 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  HEENT: [place]/AT PEERL, EOMI, anicteric Neck: Trachea midline,  no masses, no thyromegal,y no JVD, no carotid bruit OROPHARYNX:  Dry  mucosa. Whitish exudate on posterior pharynx.   Heart: Regular rate and rhythm, without murmurs, rubs, gallops,  Lungs: Clear to auscultation, no wheezing or rhonchi noted. No increased vocal fremitus resonant to percussion  Abdomen: Soft, nontender, nondistended, positive bowel sounds, no masses no hepatosplenomegaly noted..  Neuro: No focal neurological deficits noted cranial nerves II through XII grossly intact. Strength at baseline in bilateral upper and lower extremities. Musculoskeletal: No warm swelling or erythema around joints, no spinal tenderness noted. Psychiatric: Patient alert and oriented x3, good insight and cognition, good recent to remote recall. Lymph node survey: No cervical axillary or inguinal lymphadenopathy noted.  Lab results:  Recent Labs  04/29/14 1539 05/02/14 0700  NA 129* 130*  K 3.5* 3.7  CL 95* 92*  CO2 22 21  GLUCOSE 99 95  BUN 4* 6  CREATININE 0.67 0.72  CALCIUM 9.3 9.5    Recent Labs  04/29/14 1539 05/02/14 0700  AST 14 59*  ALT 9 55*  ALKPHOS 58 102  BILITOT 1.2 1.6*  PROT 7.6 8.3  ALBUMIN 3.8 3.8    Recent Labs  05/02/14 0700  LIPASE 35    Recent Labs  04/29/14 1539 05/02/14 0700  WBC 15.7* 22.7*  NEUTROABS 12.7* 19.1*  HGB 8.6* 9.1*  HCT 24.8* 26.6*  MCV 92.9 90.2  PLT 737* 363   No results for input(s): CKTOTAL, CKMB, CKMBINDEX, TROPONINI in the last 72 hours. Invalid input(s): POCBNP No results for input(s): DDIMER in the last 72 hours. No results for input(s): HGBA1C in the last 72 hours. No results for input(s): CHOL, HDL, LDLCALC, TRIG, CHOLHDL, LDLDIRECT in the last 72 hours. No results for input(s): TSH, T4TOTAL, T3FREE, THYROIDAB in the last 72 hours.  Invalid input(s): FREET3  Recent Labs  04/29/14 1539 05/02/14 0700  RETICCTPCT 5.0* 7.1*   Imaging results:  Dg Neck Soft Tissue  04/16/2014   CLINICAL DATA:  Stridor.  Group a strep.  Initial encounter.  EXAM: NECK SOFT TISSUES - 1+ VIEW  COMPARISON:   None.  FINDINGS: Levoconvex torticollis is present. The prevertebral soft tissues appear within normal limits. The epiglottis is normal.  IMPRESSION: 1. Normal epiglottis. 2. Mild levoconvex torticollis.   Electronically Signed   By: Dereck Ligas M.D.   On: 04/16/2014 14:58   Dg Chest 2 View  04/15/2014   CLINICAL DATA:  Cough, fever, history of pneumonia and strep throat, sickle cell disease  EXAM: CHEST  2 VIEW  COMPARISON:  04/14/2014  FINDINGS: RIGHT arm PICC line with tip projecting over mid SVC.  Enlargement of cardiac silhouette with pulmonary  vascular congestion.  RIGHT upper lobe volume loss.  No gross infiltrate, pleural effusion, or pneumothorax.  Bones unremarkable.  IMPRESSION: Enlargement of cardiac silhouette with pulmonary vascular congestion consistent with history of sickle cell disease.  No acute infiltrate.   Electronically Signed   By: Lavonia Dana M.D.   On: 04/15/2014 15:25   US Abdomen Limited  04/16/2014   CLINICAL DATA:  Abdominal wall abscess. Sickle cell crisis. Abdominal wall pain.  EXAM: LIMITED ABDOMINAL ULTRASOUND  COMPARISON:  None.  FINDINGS: Scanning was performed over the area of pain. There was no abscess. No subcutaneous edema was identified. The abdominal wall appears normal.  IMPRESSION: Negative.   Electronically Signed   By: Dereck Ligas M.D.   On: 04/16/2014 14:44   Ir Fluoro Guide Cv Line Right  04/14/2014   CLINICAL DATA:  Sickle cell crisis, acute pain crisis, no current access  EXAM: POWER PICC LINE PLACEMENT WITH ULTRASOUND AND FLUOROSCOPIC GUIDANCE  FLUOROSCOPY TIME:  12 seconds  PROCEDURE: The patient was advised of the possible risks andcomplications and agreed to undergo the procedure. The patient was then brought to the angiographic suite for the procedure.  The right arm was prepped with chlorhexidine, drapedin the usual sterile fashion using maximum barrier technique (cap and mask, sterile gown, sterile gloves, large sterile sheet, hand hygiene  and cutaneous antisepsis) and infiltrated locally with 1% Lidocaine.  Ultrasound demonstrated patency of the right brachial vein, and this was documented with an image. Under real-time ultrasound guidance, this vein was accessed with a 21 gauge micropuncture needle and image documentation was performed. A 0.018 wire was introduced in to the vein. Over this, a 5 [place] double lumen power PICC was advanced to the lower SVC/right atrial junction. Fluoroscopy during the procedure and fluoro spot radiograph confirms appropriate catheter position. The catheter was flushed and covered with asterile dressing.  Catheter length: 38  Complications: None immediate  IMPRESSION: Successful right arm power PICC line placement with ultrasound and fluoroscopic guidance. The catheter is ready for use.   Electronically Signed   By: Daryll Brod M.D.   On: 04/14/2014 08:18   Ir US Guide Vasc Access Right  04/14/2014   CLINICAL DATA:  Sickle cell crisis, acute pain crisis, no current access  EXAM: POWER PICC LINE PLACEMENT WITH ULTRASOUND AND FLUOROSCOPIC GUIDANCE  FLUOROSCOPY TIME:  12 seconds  PROCEDURE: The patient was advised of the possible risks andcomplications and agreed to undergo the procedure. The patient was then brought to the angiographic suite for the procedure.  The right arm was prepped with chlorhexidine, drapedin the usual sterile fashion using maximum barrier technique (cap and mask, sterile gown, sterile gloves, large sterile sheet, hand hygiene and cutaneous antisepsis) and infiltrated locally with 1% Lidocaine.  Ultrasound demonstrated patency of the right brachial vein, and this was documented with an image. Under real-time ultrasound guidance, this vein was accessed with a 21 gauge micropuncture needle and image documentation was performed. A 0.018 wire was introduced in to the vein. Over this, a 5 [place] double lumen power PICC was advanced to the lower SVC/right atrial junction. Fluoroscopy during the  procedure and fluoro spot radiograph confirms appropriate catheter position. The catheter was flushed and covered with asterile dressing.  Catheter length: 38  Complications: None immediate  IMPRESSION: Successful right arm power PICC line placement with ultrasound and fluoroscopic guidance. The catheter is ready for use.   Electronically Signed   By: Daryll Brod M.D.   On: 04/14/2014 08:18  Dg Chest Port 1 View  04/14/2014   CLINICAL DATA:  Fever.  Sickle cell.  EXAM: PORTABLE CHEST - 1 VIEW  COMPARISON:  02/16/2014  FINDINGS: Bilateral airspace disease has developed since the prior study and may represent pneumonia. Atelectasis and fluid overload also possible. No effusion.  Cardiac enlargement.  Right arm PICC tip in the SVC  Bony changes of sickle cell.  IMPRESSION: Diffuse bilateral airspace disease may represent pneumonia or edema.   Electronically Signed   By: Franchot Gallo M.D.   On: 04/14/2014 07:39   Other results:    Assessment and Plan: 1. Acute Pharyngitis: Pt has recently been diagnosed with Group A strept pharyngitis. She had a recent positive strept test 3 days ago which was felt to reflect colonization rather than acute infection.However she now clearly has an exudate consistent with posterior pharyngeal infection.I will start on Penicillin as she has failed oral and out patient treatment. 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 Based on clinical examination I am not entirely convinced of an extension of the infection however in the setting of drooling I agree with the MRI as due to her pregnancy she cannot obtain a neck x-ray.  2. Dehydration: Will restore volume with IVF until patient is able to maintain hydration by oral intake.  3. HB SS with crisis: Crisis likely triggered by infection. Will treat with Dilaudid via PCA. She is unable to receive NSAIDs d/t pregnancy. Continue IVF.   4.  Leukocytosis: Likely a combination of crisis and infection. Will obtain blood cultures and Urine evaluation  5. Pregnancy: Pt has had  Positive pregnancy but has not yet been seen by Ob-Gyn. Low risk due to gestational age.    MATTHEWS,MICHELLE A. 05/02/2014, 10:07 AM

## 2014-05-02 NOTE — Progress Notes (Addendum)
Patient ID: [name], female   DOB: 10-Jun-1990, 23 y.o.   MRN: [id number] Pt seen and examined. She is here for strep pharyngitis and sickle cell crisis. Orders were reviewed. Please see H&P from Dr. Zigmund Daniel. MRI reviewed showing no signs of pharyngeal abscess. She is hyponatremic, so NS rate increased and D5/0.45% was discontinued. In addition, pt will need PICC line for further antibiotic administration post-discharge. She received Bicillin in ED, Will start Ampicillin IV for treatment.  -Kalman Shan, MD

## 2014-05-02 NOTE — ED Notes (Signed)
I went into patient's room and told her it had been 10 minutes and that we needed her to urinate and if she wasn't able we'd have to do an I&O cath. Patient moaned and groaned and I placed feet in slippers. Dr. West Pugh walked in room and is assessing patient. Will attempt to obtain specimen after Dr. West Pugh leaves. Uvaldo Bristle, NT notified as well as Arts administrator.

## 2014-05-02 NOTE — ED Notes (Signed)
Patient is aware we need a urine specimen- patient states "give me 5 minutes- I'll go in 5 minutes" Estill Bamberg, NT lead tech notified

## 2014-05-02 NOTE — ED Provider Notes (Signed)
CSN: [id number]     Arrival date & time 05/02/14  0354 History   First MD Initiated Contact with Patient 05/02/14 [phone]     Chief Complaint  Patient presents with  . Sore Throat  . Sickle Cell Pain Crisis    Patient is a 23 y.o. female presenting with pharyngitis and sickle cell pain. The history is provided by the patient.  Sore Throat This is a recurrent problem. The current episode started 2 days ago. The problem occurs constantly. The problem has been gradually worsening. Pertinent negatives include no chest pain, no abdominal pain and no shortness of breath. The symptoms are aggravated by swallowing. The symptoms are relieved by rest. She has tried acetaminophen for the symptoms. The treatment provided no relief.  Sickle Cell Pain Crisis Location:  Back Severity:  Severe Onset quality:  Gradual Timing:  Constant Progression:  Worsening Chronicity:  Recurrent Relieved by:  Nothing Worsened by:  Nothing tried Associated symptoms: fever and vomiting   Associated symptoms: no chest pain, no cough and no shortness of breath   Patient reports recurrent sore throat for 2 days.   She reports was seen in [facility], diagnosed with strep throat and given PCN Since she has been taking regularly APAP with no relief She reports difficulty swallowing  She also reports back pain similar to prior exacerbations of her sickle cell pain  She denies cough/CP/SOB/abdominal pain No dysuria is reported.    She reports she is approximately [redacted] weeks pregnant  Past Medical History  Diagnosis Date  . H/O: 1 miscarriage 03/22/2011    Pt reports 2 miscarriages.  [name] 01/08/2009  . Depression 01/06/2011  . GERD (gastroesophageal reflux disease) 02/17/2011  . Trichotillomania     h/o  . Blood transfusion     "lots"  . Sickle cell anemia with crisis   . Exertional dyspnea     "sometimes"  . Sickle cell anemia   . Headache(784.0)   . Migraines 11/08/11    "@ least twice/month"   . Chronic back pain     "very severe; have knot in my back; from tight muscle; take RX and exercise for it"  . Mood swings 11/08/11    "I go back and forth; real bad"  . Pregnancy   . Blood dyscrasia     SICKLE CELL   Past Surgical History  Procedure Laterality Date  . Cholecystectomy  05/2010  . Dilation and curettage of uterus  02/20/11    S/P miscarriage   Family History  Problem Relation Age of Onset  . Diabetes Mother   . Alcoholism    . Depression    . Hypercholesterolemia    . Hypertension    . Migraines    . Diabetes Maternal Grandmother   . Diabetes Paternal Grandmother    History  Substance Use Topics  . Smoking status: Former Smoker -- 0.25 packs/day for 1 years    Types: Cigarettes    Quit date: 03/25/2013  . Smokeless tobacco: Never Used  . Alcohol Use: No     Comment: pt states she quit marijuan in May 2013. Rare ETOH, + cigarettes.  She is enrolled in school   OB History    Gravida Para Term Preterm AB TAB SAB Ectopic Multiple Living   3    1  1          Obstetric Comments   Miscarried in October 2012 at about 7 weeks     Review of Systems  Constitutional: Positive for fever and chills.  HENT: Negative for drooling.   Respiratory: Negative for cough and shortness of breath.   Cardiovascular: Negative for chest pain.  Gastrointestinal: Positive for vomiting. Negative for abdominal pain and diarrhea.  Genitourinary: Negative for dysuria and vaginal bleeding.  All other systems reviewed and are negative.     Allergies  Carrot oil and Latex  Home Medications   Prior to Admission medications   Medication Sig Start Date End Date Taking? Authorizing Provider  alprazolam Duanne Moron) 2 MG tablet Take 1 tablet by mouth 2 (two) times daily as needed. Anxiety 04/01/14  Yes Historical Provider, MD  folic acid (FOLVITE) 1 MG tablet Take 2 tablets (2 mg total) by mouth daily. 04/24/14  Yes Dorena Dew, FNP  methadone (DOLOPHINE) 5 MG tablet Take 1 tablet  (5 mg total) by mouth at bedtime. 04/29/14  Yes Dorena Dew, FNP  Oxycodone HCl 10 MG TABS Take 1.5 tablets (15 mg total) by mouth every 6 (six) hours as needed (moderate pain). 04/29/14  Yes Dorena Dew, FNP  Prenatal Vit-Fe Fumarate-FA (PRENATAL MULTIVITAMIN) TABS tablet Take 1 tablet by mouth daily at 12 noon.   Yes Historical Provider, MD  sulindac (CLINORIL) 150 MG tablet Take 1 tablet (150 mg total) by mouth 2 (two) times daily as needed (Pain in right thigh). Patient not taking: Reported on 04/09/2014 02/25/14   [name], MD   BP 104/62 mmHg  Pulse 120  Temp(Src) 99.5 F (37.5 C) (Oral)  Resp 19  SpO2 100%  LMP 03/20/2014 Physical Exam CONSTITUTIONAL: uncomfortable appearing HEAD: Normocephalic/atraumatic EYES: EOMI/PERRL ENMT: Mucous membranes moist uvula midline, exudates/erythema noted.  No edema noted to posterior oropharynx.   NECK: supple no meningeal signs, diffuse tenderness to anterior neck but no soft tissue mass noted SPINE/BACK:entire spine nontender, diffuse paraspinal tenderness, No bruising/crepitance/stepoffs noted to spine CV: S1/S2 noted, no murmurs/rubs/gallops noted LUNGS: Lungs are clear to auscultation bilaterally, no apparent distress ABDOMEN: soft, nontender, no rebound or guarding, bowel sounds noted throughout abdomen GU:no cva tenderness NEURO: Pt is awake/alert/appropriate, moves all extremitiesx4.  No facial droop.   EXTREMITIES: pulses normal/equal, full ROM SKIN: warm, color normal PSYCH: no abnormalities of mood noted, alert and oriented to situation  ED Course  Procedures   8:22 AM Pt with h/o Sickle Cell Disease here with continued pharyngitis despite recent treatment with PCN.  She also had treatment of pharyngitis while in hospital at end of November.  I am concerned for deep space infection (h/o Newell disease low grade fever, leukocytosis) however given pregnancy this complicates imaging modalities.  I spoke to radiology  Jeralyn Ruths) and recommend MR soft tissue neck without contrast as she has already had plain imaging previously and symptoms not responding to APAP/PCN For her back pain she reports this is similar to prior episodes of [place] pain crisis   9:48 AM D/W DR MATTHEWS WILL ADMIT WHILE MR IMAGING IS PENDING   Labs Review Labs Reviewed  CBC WITH DIFFERENTIAL - Abnormal; Notable for the following:    WBC 22.7 (*)    RBC 2.95 (*)    Hemoglobin 9.1 (*)    HCT 26.6 (*)    RDW 17.8 (*)    Neutrophils Relative % 84 (*)    Lymphocytes Relative 8 (*)    Neutro Abs 19.1 (*)    Monocytes Absolute 1.8 (*)    All other components within normal limits  RETICULOCYTES - Abnormal; Notable for the following:  Retic Ct Pct 7.1 (*)    RBC. 2.95 (*)    Retic Count, Manual 209.5 (*)    All other components within normal limits  COMPREHENSIVE METABOLIC PANEL - Abnormal; Notable for the following:    Sodium 130 (*)    Chloride 92 (*)    AST 59 (*)    ALT 55 (*)    Total Bilirubin 1.6 (*)    Anion gap 17 (*)    All other components within normal limits  LIPASE, BLOOD  URINALYSIS, ROUTINE W REFLEX MICROSCOPIC  LACTATE DEHYDROGENASE  I-STAT CG4 LACTIC ACID, ED    Imaging Review No results found.  Medications  sodium chloride 0.9 % bolus 1,000 mL (1,000 mLs Intravenous New Bag/Given 05/02/14 0714)  HYDROmorphone (DILAUDID) injection 2 mg (2 mg Intravenous Given 05/02/14 0745)  ondansetron (ZOFRAN) injection 4 mg (4 mg Intravenous Given 05/02/14 0745)    MDM   Final diagnoses:  Pharyngitis  Sickle cell pain crisis    Nursing notes including past medical history and social history reviewed and considered in documentation Labs/vital reviewed myself and considered during evaluation Previous records reviewed and considered     Sharyon Cable, MD 05/02/14 [phone]

## 2014-05-03 ENCOUNTER — Inpatient Hospital Stay (HOSPITAL_COMMUNITY): Payer: Medicare Other

## 2014-05-03 DIAGNOSIS — Z3490 Encounter for supervision of normal pregnancy, unspecified, unspecified trimester: Secondary | ICD-10-CM

## 2014-05-03 LAB — CBC WITH DIFFERENTIAL/PLATELET
Basophils Absolute: 0 10*3/uL (ref 0.0–0.1)
Basophils Relative: 0 % (ref 0–1)
EOS ABS: 0.1 10*3/uL (ref 0.0–0.7)
Eosinophils Relative: 0 % (ref 0–5)
HCT: 23.8 % — ABNORMAL LOW (ref 36.0–46.0)
Hemoglobin: 8.2 g/dL — ABNORMAL LOW (ref 12.0–15.0)
LYMPHS ABS: 1.7 10*3/uL (ref 0.7–4.0)
Lymphocytes Relative: 8 % — ABNORMAL LOW (ref 12–46)
MCH: 30.7 pg (ref 26.0–34.0)
MCHC: 34.5 g/dL (ref 30.0–36.0)
MCV: 89.1 fL (ref 78.0–100.0)
Monocytes Absolute: 3.2 10*3/uL — ABNORMAL HIGH (ref 0.1–1.0)
Monocytes Relative: 15 % — ABNORMAL HIGH (ref 3–12)
Neutro Abs: 15.5 10*3/uL — ABNORMAL HIGH (ref 1.7–7.7)
Neutrophils Relative %: 76 % (ref 43–77)
PLATELETS: 501 10*3/uL — AB (ref 150–400)
RBC: 2.67 MIL/uL — AB (ref 3.87–5.11)
RDW: 18.6 % — ABNORMAL HIGH (ref 11.5–15.5)
WBC: 20.5 10*3/uL — ABNORMAL HIGH (ref 4.0–10.5)

## 2014-05-03 LAB — LACTATE DEHYDROGENASE: LDH: 215 U/L (ref 94–250)

## 2014-05-03 LAB — BASIC METABOLIC PANEL
ANION GAP: 13 (ref 5–15)
BUN: 4 mg/dL — ABNORMAL LOW (ref 6–23)
CALCIUM: 9.2 mg/dL (ref 8.4–10.5)
CO2: 25 mEq/L (ref 19–32)
Chloride: 97 mEq/L (ref 96–112)
Creatinine, Ser: 0.61 mg/dL (ref 0.50–1.10)
GFR calc Af Amer: >90 mL/min (ref >90)
GFR calc non Af Amer: >90 mL/min (ref >90)
GLUCOSE: 111 mg/dL — AB (ref 70–99)
Potassium: 3.3 mEq/L — ABNORMAL LOW (ref 3.7–5.3)
SODIUM: 135 meq/L — AB (ref 137–147)

## 2014-05-03 LAB — RETICULOCYTES
RBC.: 2.67 MIL/uL — ABNORMAL LOW (ref 3.87–5.11)
RETIC CT PCT: 7.9 % — AB (ref 0.4–3.1)
Retic Count, Absolute: 210.9 10*3/uL — ABNORMAL HIGH (ref 19.0–186.0)

## 2014-05-03 MED ORDER — POTASSIUM CHLORIDE CRYS ER 20 MEQ PO TBCR
40.0000 meq | EXTENDED_RELEASE_TABLET | Freq: Once | ORAL | Status: AC
  Administered 2014-05-03: 40 meq via ORAL
  Filled 2014-05-03: qty 2

## 2014-05-03 MED ORDER — HYDROMORPHONE HCL 2 MG/ML IJ SOLN: 2.0000 mg | INTRAMUSCULAR | Status: DC

## 2014-05-03 MED ORDER — ACETAMINOPHEN 325 MG PO TABS
650.0000 mg | ORAL_TABLET | Freq: Four times a day (QID) | ORAL | Status: DC | PRN
  Administered 2014-05-03 (×2): 650 mg via ORAL
  Filled 2014-05-03 (×2): qty 2

## 2014-05-03 NOTE — Progress Notes (Addendum)
Patient ID: [name], female   DOB: [birth year], 23 y.o.   MRN: [id number] SICKLE CELL SERVICE PROGRESS NOTE  ISYSS ESPINAL OHY:[id number] DOB: Aug 20, 1990 DOA: 05/02/2014 PCP: MATTHEWS,MICHELLE A., MD   Presenting HPI: Pt well known with Hb SS and recent Group A strept infection presents to the ED with c/o throat pain and drooling x 11/2 days. She was treated with IM Bicillin in the office on 04/29/2014 after she had received a course of amoxicillin and depot bicillin about 2 weeks ago. This was discussed with ID (Dr. Linus Salmons) who recommended re-treating with depot Bicillin. She comes in today with complaints of difficulty swallowing and drooling and foul smelling breath. She states that this triggered a crisis and she is now having pain typical of her SCD type pain. The pain is localized to the back and legs. She reports that she had one episode of emesis last night. However although she has not had significant emesis, she has not had much oral intake as it hurts to drink. She denies a fever but states that she has had chills. She denies any diarrhea, SOB, Abdominal pain. Of note, the patient is pregnant of about 6 weeks.  She reports that she came to the ED as her mother had to leave early for work and she would not otherwise have had a way to seek medical care.   Consultants:  none  Procedures:  none  Antibiotics:  Bicillin 12/18  Ampicillin 12/18  HPI/Subjective: Pt complains of pain in her throat and back. She has lost two IVs since admission and is currently without. IV team will try to place another. Possibility of PICC placement today. Pt is currently NPO for it and is upset that she is not able to eat.   Objective: Filed Vitals:   05/03/14 0131 05/03/14 0335 05/03/14 0552 05/03/14 0800  BP: 98/54  94/45   Pulse: 87  105   Temp: 99.5 F (37.5 C)  98.2 F (36.8 C)   TempSrc: Oral  Oral   Resp: 18 13 14 14   Height:      Weight:   163 lb (73.936 kg)   SpO2: 99%  99% 98% 100%   Weight change:   Intake/Output Summary (Last 24 hours) at 05/03/14 1034 Last data filed at 05/03/14 0600  Gross per 24 hour  Intake 1539.58 ml  Output      0 ml  Net 1539.58 ml    General: Alert, awake, oriented x3, in no acute distress.  HEENT: /AT [place]ER[place], EOMI Neck: Trachea midline,  no masses, no thyromegal,y no JVD, no carotid bruit OROPHARYNX:  Moist, mild pharyngeal edema with tonsilar exudates Heart: Regular rate and rhythm, without murmurs, rubs, gallops. Lungs: Clear to auscultation Abdomen: Soft, nontender, nondistended, positive bowel sounds, no masses no hepatosplenomegaly noted.  Neuro: No focal neurological deficits noted cranial nerves II through XII grossly intact. Musculoskeletal: No warm swelling or erythema around joints, no spinal tenderness noted.   Data Reviewed: Basic Metabolic Panel:  Recent Labs Lab 04/29/14 1539 05/02/14 0700 05/02/14 0800 05/03/14 0515  NA 129* 130*  --  135*  K 3.5* 3.7  --  3.3*  CL 95* 92*  --  97  CO2 22 21  --  25  GLUCOSE 99 95  --  111*  BUN 4* 6  --  4*  CREATININE 0.67 0.72  --  0.61  CALCIUM 9.3 9.5  --  9.2  MG  --   --  1.9  --  Liver Function Tests:  Recent Labs Lab 04/29/14 1539 05/02/14 0700  AST 14 59*  ALT 9 55*  ALKPHOS 58 102  BILITOT 1.2 1.6*  PROT 7.6 8.3  ALBUMIN 3.8 3.8   CBC:  Recent Labs Lab 04/29/14 1539 05/02/14 0700 05/03/14 0515  WBC 15.7* 22.7* 20.5*  NEUTROABS 12.7* 19.1* 15.5*  HGB 8.6* 9.1* 8.2*  HCT 24.8* 26.6* 23.8*  MCV 92.9 90.2 89.1  PLT 737* 363 501*    Studies: Dg Chest 2 View  01/13/2014   CLINICAL DATA:  Chest pain and sickle cell crisis  EXAM: CHEST  2 VIEW  COMPARISON:  PA and lateral chest of November 25, 2013  FINDINGS: The lungs are adequately inflated. There is no focal infiltrate. The heart and pulmonary vascularity exhibit no acute changes. There is stable central pulmonary vascular prominence. There is no pleural effusion. There are chronic  vertebral body and humeral head changes consistent with sickle cell disease.  IMPRESSION: There is no evidence of pulmonary edema or alveolar pneumonia. There are mild stable changes consistent with the sequelae of sickle cell disease.   Electronically Signed   By: David  Martinique   On: 01/13/2014 13:27    Scheduled Meds: . ampicillin (OMNIPEN) IV  2 g Intravenous 4 times per day  . enoxaparin (LOVENOX) injection  40 mg Subcutaneous Q24H  . folic acid  2 mg Oral Daily  . HYDROmorphone PCA 2 mg/mL   Intravenous 6 times per day  . methadone  5 mg Oral QHS  . prenatal multivitamin  1 tablet Oral Q1200  . senna-docusate  1 tablet Oral BID   Continuous Infusions: . sodium chloride 125 mL/hr at 05/03/14 0158    Active Problems:   Sickle cell pain crisis   Assessment/Plan: Active Problems:   Sickle cell pain crisis  1. Sickle Cell Crisis: Pt still complains of pain in her back that is typical of her crisis. Will give Dilaudid 2mg  q3h  [place] until IV can be established. Once IV is established restart Dilaudid PCA. Continue Methadone for long acting control.  2. Strep Pharyngitis-Received Bicillin IM yesterday and Ampicillin was started. Continue Ampicillin. Consult ID in the morning for recs on IV home management. 3. Sickle Cell Care: Continue Folic acid and hydroxyurea   4. FEN/GI : Hyponatremia and hypokalemia-From poor PO. Continue NS as below. Give KCl 40 meq. Recheck in the AM. NPO til PICC then regular diet IV fluids-NS Bowel regimen in place  Code Status: full DVT Prophylaxis: enoxaparin Family Communication: none Disposition Plan:   Kalman Shan  Pager [phone]. If 7PM-7AM, please contact night-coverage.  05/03/2014, 10:34 AM  LOS: 1 day   Kalman Shan

## 2014-05-03 NOTE — Procedures (Signed)
Placement of left arm PICC.  Placed in left brachial vein and tip at SVC/RA junction.  Ready to use.

## 2014-05-04 LAB — BASIC METABOLIC PANEL
Anion gap: 13 (ref 5–15)
BUN: 4 mg/dL — ABNORMAL LOW (ref 6–23)
CALCIUM: 8.8 mg/dL (ref 8.4–10.5)
CO2: 25 meq/L (ref 19–32)
Chloride: 97 mEq/L (ref 96–112)
Creatinine, Ser: 0.58 mg/dL (ref 0.50–1.10)
GFR calc Af Amer: >90 mL/min (ref >90)
GFR calc non Af Amer: >90 mL/min (ref >90)
GLUCOSE: 88 mg/dL (ref 70–99)
Potassium: 3.3 mEq/L — ABNORMAL LOW (ref 3.7–5.3)
Sodium: 135 mEq/L — ABNORMAL LOW (ref 137–147)

## 2014-05-04 LAB — HEMOGLOBIN AND HEMATOCRIT, BLOOD
HCT: 20 % — ABNORMAL LOW (ref 36.0–46.0)
Hemoglobin: 6.9 g/dL — CL (ref 12.0–15.0)

## 2014-05-04 MED ORDER — POTASSIUM CHLORIDE CRYS ER 20 MEQ PO TBCR
40.0000 meq | EXTENDED_RELEASE_TABLET | Freq: Once | ORAL | Status: AC
  Administered 2014-05-04: 40 meq via ORAL
  Filled 2014-05-04: qty 2

## 2014-05-04 NOTE — Progress Notes (Signed)
Patient ID: [name], female   DOB: 30-Apr-1991, 23 y.o.   MRN: [id number] SICKLE CELL SERVICE PROGRESS NOTE  [name] QQI:[id number] DOB: April 23, 1991 DOA: 05/02/2014 PCP: MATTHEWS,MICHELLE A., MD   Presenting HPI: Pt well known with Hb SS and recent Group A strept infection presents to the ED with c/o throat pain and drooling x 11/2 days. She was treated with IM Bicillin in the office on 04/29/2014 after she had received a course of amoxicillin and depot bicillin about 2 weeks ago. This was discussed with ID (Dr. Linus Salmons) who recommended re-treating with depot Bicillin. She comes in today with complaints of difficulty swallowing and drooling and foul smelling breath. She states that this triggered a crisis and she is now having pain typical of her SCD type pain. The pain is localized to the back and legs. She reports that she had one episode of emesis last night. However although she has not had significant emesis, she has not had much oral intake as it hurts to drink. She denies a fever but states that she has had chills. She denies any diarrhea, SOB, Abdominal pain. Of note, the patient is pregnant of about 6 weeks.  She reports that she came to the ED as her mother had to leave early for work and she would not otherwise have had a way to seek medical care.   Consultants:  none  Procedures:  none  Antibiotics:  Bicillin 12/18  Ampicillin 12/18  HPI/Subjective: Pt states that her throat pain is better. Her back and side pain has also improved and she rates it as a 7/10. She received a PICC line yesterday afternoon and was restarted on her PCA. Antibiotics were delayed but administered once line was in place.  Objective: Filed Vitals:   05/04/14 0547 05/04/14 0800 05/04/14 1200 05/04/14 1326  BP: 117/60   104/60  Pulse: 100   91  Temp: 99.2 F (37.3 C)   97.4 F (36.3 C)  TempSrc: Oral   Oral  Resp: 14 14 16 18   Height:      Weight:      SpO2: 98% 99% 100% 100%    Weight change:   Intake/Output Summary (Last 24 hours) at 05/04/14 1331 Last data filed at 05/03/14 1350  Gross per 24 hour  Intake 979.17 ml  Output      0 ml  Net 979.17 ml    General: Alert, awake, oriented x3, in no acute distress.  HEENT: [place]/AT PEERL, EOMI Neck: Trachea midline,  no masses, no thyromegal,y no JVD, no carotid bruit OROPHARYNX:  Moist, mild erythema with tonsilar exudates Heart: Regular rate and rhythm, without murmurs, rubs, gallops. Lungs: Clear to auscultation Abdomen: Soft, nontender, nondistended, positive bowel sounds, no masses no hepatosplenomegaly noted.  Neuro: No focal neurological deficits noted cranial nerves II through XII grossly intact. 5/5 UE and LE strength. Musculoskeletal: No warm swelling or erythema around joints, no spinal tenderness noted.  Extremities: Left brachial with PICC line.  Data Reviewed: Basic Metabolic Panel:  Recent Labs Lab 04/29/14 1539 05/02/14 0700 05/02/14 0800 05/03/14 0515 05/04/14 0530  NA 129* 130*  --  135* 135*  K 3.5* 3.7  --  3.3* 3.3*  CL 95* 92*  --  97 97  CO2 22 21  --  25 25  GLUCOSE 99 95  --  111* 88  BUN 4* 6  --  4* 4*  CREATININE 0.67 0.72  --  0.61 0.58  CALCIUM 9.3 9.5  --  9.2 8.8  MG  --   --  1.9  --   --    Liver Function Tests:  Recent Labs Lab 04/29/14 1539 05/02/14 0700  AST 14 59*  ALT 9 55*  ALKPHOS 58 102  BILITOT 1.2 1.6*  PROT 7.6 8.3  ALBUMIN 3.8 3.8   CBC:  Recent Labs Lab 04/29/14 1539 05/02/14 0700 05/03/14 0515 05/04/14 0530  WBC 15.7* 22.7* 20.5*  --   NEUTROABS 12.7* 19.1* 15.5*  --   HGB 8.6* 9.1* 8.2* 6.9*  HCT 24.8* 26.6* 23.8* 20.0*  MCV 92.9 90.2 89.1  --   PLT 737* 363 501*  --     Studies: Dg Chest 2 View  01/13/2014   CLINICAL DATA:  Chest pain and sickle cell crisis  EXAM: CHEST  2 VIEW  COMPARISON:  PA and lateral chest of November 25, 2013  FINDINGS: The lungs are adequately inflated. There is no focal infiltrate. The heart and  pulmonary vascularity exhibit no acute changes. There is stable central pulmonary vascular prominence. There is no pleural effusion. There are chronic vertebral body and humeral head changes consistent with sickle cell disease.  IMPRESSION: There is no evidence of pulmonary edema or alveolar pneumonia. There are mild stable changes consistent with the sequelae of sickle cell disease.   Electronically Signed   By: David  Martinique   On: 01/13/2014 13:27    Scheduled Meds: . ampicillin (OMNIPEN) IV  2 g Intravenous 4 times per day  . enoxaparin (LOVENOX) injection  40 mg Subcutaneous Q24H  . folic acid  2 mg Oral Daily  . HYDROmorphone PCA 2 mg/mL   Intravenous 6 times per day  . methadone  5 mg Oral QHS  . prenatal multivitamin  1 tablet Oral Q1200  . senna-docusate  1 tablet Oral BID   Continuous Infusions: . sodium chloride 125 mL/hr at 05/03/14 0158    Active Problems:   Sickle cell pain crisis   Assessment/Plan: Active Problems:   Sickle cell pain crisis  1. Sickle Cell Crisis: Pt still complains of pain in her back that is typical of her crisis. Continue Dilaudid PCA. Continue Methadone for long acting control. Day 3 of antibiotics. 2. Strep Pharyngitis- Continue Ampicillin. Consult ID in the morning for recs on IV home management. 3. Sickle Cell Care: Continue Folic acid and hydroxyurea   4. FEN/GI : Hyponatremia and hypokalemia-From poor PO. Continue NS as below. Given KCl 40 meq. Recheck in the AM. Regular diet IV fluids-NS Bowel regimen in place  Code Status: full DVT Prophylaxis: enoxaparin Family Communication: none Disposition Plan:   Kalman Shan  Pager [phone]. If 7PM-7AM, please contact night-coverage.  05/04/2014, 1:31 PM  LOS: 2 days   Kalman Shan

## 2014-05-05 ENCOUNTER — Telehealth: Payer: Self-pay | Admitting: Internal Medicine

## 2014-05-05 DIAGNOSIS — E876 Hypokalemia: Secondary | ICD-10-CM

## 2014-05-05 DIAGNOSIS — R509 Fever, unspecified: Secondary | ICD-10-CM

## 2014-05-05 DIAGNOSIS — B950 Streptococcus, group A, as the cause of diseases classified elsewhere: Secondary | ICD-10-CM

## 2014-05-05 DIAGNOSIS — J029 Acute pharyngitis, unspecified: Secondary | ICD-10-CM | POA: Insufficient documentation

## 2014-05-05 LAB — CBC WITH DIFFERENTIAL/PLATELET
BASOS PCT: 0 % (ref 0–1)
Basophils Absolute: 0 10*3/uL (ref 0.0–0.1)
EOS ABS: 0.3 10*3/uL (ref 0.0–0.7)
Eosinophils Relative: 2 % (ref 0–5)
HCT: 17.3 % — ABNORMAL LOW (ref 36.0–46.0)
HEMOGLOBIN: 6.1 g/dL — AB (ref 12.0–15.0)
LYMPHS PCT: 17 % (ref 12–46)
Lymphs Abs: 2.3 10*3/uL (ref 0.7–4.0)
MCH: 30.7 pg (ref 26.0–34.0)
MCHC: 35.3 g/dL (ref 30.0–36.0)
MCV: 86.9 fL (ref 78.0–100.0)
Monocytes Absolute: 1.5 10*3/uL — ABNORMAL HIGH (ref 0.1–1.0)
Monocytes Relative: 11 % (ref 3–12)
Neutro Abs: 9.5 10*3/uL — ABNORMAL HIGH (ref 1.7–7.7)
Neutrophils Relative %: 70 % (ref 43–77)
Platelets: 303 10*3/uL (ref 150–400)
RBC: 1.99 MIL/uL — AB (ref 3.87–5.11)
RDW: 20.5 % — ABNORMAL HIGH (ref 11.5–15.5)
WBC: 13.6 10*3/uL — ABNORMAL HIGH (ref 4.0–10.5)

## 2014-05-05 LAB — POTASSIUM: Potassium: 3.5 mEq/L — ABNORMAL LOW (ref 3.7–5.3)

## 2014-05-05 LAB — BASIC METABOLIC PANEL
Anion gap: 11 (ref 5–15)
BUN: 4 mg/dL — ABNORMAL LOW (ref 6–23)
CHLORIDE: 104 meq/L (ref 96–112)
CO2: 24 mEq/L (ref 19–32)
Calcium: 7.9 mg/dL — ABNORMAL LOW (ref 8.4–10.5)
Creatinine, Ser: 0.55 mg/dL (ref 0.50–1.10)
GFR calc Af Amer: >90 mL/min (ref >90)
GFR calc non Af Amer: >90 mL/min (ref >90)
GLUCOSE: 83 mg/dL (ref 70–99)
POTASSIUM: 2.8 meq/L — AB (ref 3.7–5.3)
Sodium: 139 mEq/L (ref 137–147)

## 2014-05-05 LAB — RETICULOCYTES
RBC.: 1.99 MIL/uL — AB (ref 3.87–5.11)
Retic Count, Absolute: 117.4 10*3/uL (ref 19.0–186.0)
Retic Ct Pct: 5.9 % — ABNORMAL HIGH (ref 0.4–3.1)

## 2014-05-05 LAB — LACTATE DEHYDROGENASE: LDH: 203 U/L (ref 94–250)

## 2014-05-05 MED ORDER — POTASSIUM CHLORIDE 10 MEQ/100ML IV SOLN
10.0000 meq | INTRAVENOUS | Status: AC
  Administered 2014-05-05 (×4): 10 meq via INTRAVENOUS
  Filled 2014-05-05 (×4): qty 100

## 2014-05-05 MED ORDER — HYDROMORPHONE 2 MG/ML HIGH CONCENTRATION IV PCA SOLN
INTRAVENOUS | Status: DC
Start: 2014-05-05 — End: 2014-05-06
  Administered 2014-05-05: 5 mg via INTRAVENOUS
  Administered 2014-05-05: 6 mg via INTRAVENOUS
  Administered 2014-05-05: 3.5 mg via INTRAVENOUS
  Administered 2014-05-06: 6.5 mg via INTRAVENOUS
  Administered 2014-05-06: 2.5 mg via INTRAVENOUS
  Administered 2014-05-06: 2 mg via INTRAVENOUS
  Administered 2014-05-06: 1 mg via INTRAVENOUS
  Administered 2014-05-06 (×2): 2 mg via INTRAVENOUS
  Filled 2014-05-05 (×2): qty 25

## 2014-05-05 MED ORDER — DEXTROSE-NACL 5-0.45 % IV SOLN
INTRAVENOUS | Status: DC
  Administered 2014-05-05 (×2): via INTRAVENOUS

## 2014-05-05 NOTE — Progress Notes (Signed)
Potassium for this morning is 2.8. MD  Notified and orders given.

## 2014-05-05 NOTE — Telephone Encounter (Signed)
Refill request for oxycodone 10mg . LOV 04/29/2014. Please advise. Thanks!

## 2014-05-05 NOTE — Progress Notes (Signed)
Patient ID: [name], female   DOB: May 23, 1990, 23 y.o.   MRN: [id number] SICKLE CELL SERVICE PROGRESS NOTE  AHONESTY WOODFIN WUJ:[id number] DOB: 19-Aug-1990 DOA: 05/02/2014 PCP: MATTHEWS,MICHELLE A., MD   Presenting HPI: Pt well known with Hb SS and recent Group A strept infection presents to the ED with c/o throat pain and drooling x 11/2 days. She was treated with IM Bicillin in the office on 04/29/2014 after she had received a course of amoxicillin and depot bicillin about 2 weeks ago. This was discussed with ID (Dr. Linus Salmons) who recommended re-treating with depot Bicillin. She comes in today with complaints of difficulty swallowing and drooling and foul smelling breath. She states that this triggered a crisis and she is now having pain typical of her SCD type pain. The pain is localized to the back and legs. She reports that she had one episode of emesis last night. However although she has not had significant emesis, she has not had much oral intake as it hurts to drink. She denies a fever but states that she has had chills. She denies any diarrhea, SOB, Abdominal pain. Of note, the patient is pregnant of about 6 weeks.  She reports that she came to the ED as her mother had to leave early for work and she would not otherwise have had a way to seek medical care.   Consultants:  none  Procedures:  none  Antibiotics:  Bicillin 12/18 X1  Ampicillin 12/18-->  HPI/Subjective: Pt states that her throat pain is better. In addition her back pain is also better.  Objective: Filed Vitals:   05/05/14 1005 05/05/14 1200 05/05/14 1342 05/05/14 1556  BP: 95/49  100/64   Pulse: 86  88   Temp: 98.7 F (37.1 C)  98 F (36.7 C)   TempSrc: Oral  Oral   Resp: 16 12 16 12   Height:      Weight:      SpO2: 100% 98% 100% 99%   Weight change:   Intake/Output Summary (Last 24 hours) at 05/05/14 1644 Last data filed at 05/05/14 1344  Gross per 24 hour  Intake    690 ml  Output      0  ml  Net    690 ml    General: Alert, awake, oriented x3, in no acute distress.  HEENT: [place]/AT PEER[place], EOMI Neck: Trachea midline,  no masses, no thyromegal,y no JVD, no carotid bruit OROPHARYNX:  Moist, no erythema with one small tonsilar exudate on left tonsil Heart: Regular rate and rhythm, without murmurs, rubs, gallops. Lungs: Clear to auscultation Abdomen: Soft, nontender, nondistended, positive bowel sounds, no masses no hepatosplenomegaly noted.  Neuro: No focal neurological deficits noted cranial nerves II through XII grossly intact. 5/5 UE and LE strength. Musculoskeletal: No warm swelling or erythema around joints, no spinal tenderness noted.  Extremities: Left brachial with PICC line.  Data Reviewed: Basic Metabolic Panel:  Recent Labs Lab 04/29/14 1539 05/02/14 0700 05/02/14 0800 05/03/14 0515 05/04/14 0530 05/05/14 0557  NA 129* 130*  --  135* 135* 139  K 3.5* 3.7  --  3.3* 3.3* 2.8*  CL 95* 92*  --  97 97 104  CO2 22 21  --  25 25 24   GLUCOSE 99 95  --  111* 88 83  BUN 4* 6  --  4* 4* 4*  CREATININE 0.67 0.72  --  0.61 0.58 0.55  CALCIUM 9.3 9.5  --  9.2 8.8 7.9*  MG  --   --  1.9  --   --   --    Liver Function Tests:  Recent Labs Lab 04/29/14 1539 05/02/14 0700  AST 14 59*  ALT 9 55*  ALKPHOS 58 102  BILITOT 1.2 1.6*  PROT 7.6 8.3  ALBUMIN 3.8 3.8   CBC:  Recent Labs Lab 04/29/14 1539 05/02/14 0700 05/03/14 0515 05/04/14 0530 05/05/14 0557  WBC 15.7* 22.7* 20.5*  --  13.6*  NEUTROABS 12.7* 19.1* 15.5*  --  9.5*  HGB 8.6* 9.1* 8.2* 6.9* 6.1*  HCT 24.8* 26.6* 23.8* 20.0* 17.3*  MCV 92.9 90.2 89.1  --  86.9  PLT 737* 363 501*  --  303    Studies: Radiological Exams on Admission: Ir Fluoro Guide Cv Line Left  05/03/2014   CLINICAL DATA:  23 year old with sickle cell crisis. Patient needs IV antibiotics.  EXAM: PLACEMENT OF PICC LINE WITH ULTRASOUND AND FLUOROSCOPIC GUIDANCE  FLUOROSCOPY TIME:  12 seconds  TECHNIQUE: The procedure was  explained to the patient. The risks and benefits of the procedure were discussed and the patient's questions were addressed. Informed consent was obtained from the patient. In particular, the risks of fluoroscopy and pregnancy were discussed with the patient. The patient's pelvis was shielded with a lead apron for this procedure.  The right arm was prepped with chlorhexidine, draped in the usual sterile fashion using maximum barrier technique (cap and mask, sterile gown, sterile gloves, large sterile sheet, hand hygiene and cutaneous antiseptic). Local anesthesia was attained by infiltration with 1% lidocaine.  Ultrasound demonstrated patency of a right brachia vein. Under real-time ultrasound guidance, this vein was accessed with a 21 gauge micropuncture needle. Wire would not advance centrally.  Attention was directed to the left arm. Ultrasound confirmed a patent left brachial vein. The left arm was prepped and draped in sterile fashion. Maximal barrier sterile technique was utilized including caps, mask, sterile gowns, sterile gloves, sterile drape, hand hygiene and skin antiseptic. 1% lidocaine was used for local anesthetic. 21 gauge needle directed into a left brachial vein with ultrasound guidance. Wire was advanced centrally. A peel-away sheath was placed. A dual lumen 5 [place] [employer] PICC line was cut to 37 cm. Catheter was advanced into the central venous system with fluoroscopy. Tip placed at the superior cavoatrial junction. Both lumens aspirated and flushed well. Catheter was secured to the skin.  COMPLICATIONS: None.  The patient tolerated the procedure well.  IMPRESSION: Successful placement of a left arm PICC with sonographic and fluoroscopic guidance. The catheter is ready for use.  Please note that a PICC line could not be successfully placed from the right arm.   Electronically Signed   By: Markus Daft M.D.   On: 05/03/2014 18:51   Ir US Guide Vasc Access Left  05/03/2014   CLINICAL DATA:   23 year old with sickle cell crisis. Patient needs IV antibiotics.  EXAM: PLACEMENT OF PICC LINE WITH ULTRASOUND AND FLUOROSCOPIC GUIDANCE  FLUOROSCOPY TIME:  12 seconds  TECHNIQUE: The procedure was explained to the patient. The risks and benefits of the procedure were discussed and the patient's questions were addressed. Informed consent was obtained from the patient. In particular, the risks of fluoroscopy and pregnancy were discussed with the patient. The patient's pelvis was shielded with a lead apron for this procedure.  The right arm was prepped with chlorhexidine, draped in the usual sterile fashion using maximum barrier technique (cap and mask, sterile gown, sterile gloves, large sterile sheet, hand hygiene and cutaneous antiseptic). Local anesthesia was attained  by infiltration with 1% lidocaine.  Ultrasound demonstrated patency of a right brachia vein. Under real-time ultrasound guidance, this vein was accessed with a 21 gauge micropuncture needle. Wire would not advance centrally.  Attention was directed to the left arm. Ultrasound confirmed a patent left brachial vein. The left arm was prepped and draped in sterile fashion. Maximal barrier sterile technique was utilized including caps, mask, sterile gowns, sterile gloves, sterile drape, hand hygiene and skin antiseptic. 1% lidocaine was used for local anesthetic. 21 gauge needle directed into a left brachial vein with ultrasound guidance. Wire was advanced centrally. A peel-away sheath was placed. A dual lumen 5 [place] [employer] PICC line was cut to 37 cm. Catheter was advanced into the central venous system with fluoroscopy. Tip placed at the superior cavoatrial junction. Both lumens aspirated and flushed well. Catheter was secured to the skin.  COMPLICATIONS: None.  The patient tolerated the procedure well.  IMPRESSION: Successful placement of a left arm PICC with sonographic and fluoroscopic guidance. The catheter is ready for use.  Please note that a  PICC line could not be successfully placed from the right arm.   Electronically Signed   By: Markus Daft M.D.   On: 05/03/2014 18:51   MRI neck 05/02/14 IMPRESSION: Marked left greater than right tonsillar enlargement with enlarged bilateral cervical and superior mediastinal lymph nodes, most consistent with tonsillitis and reactive lymphadenopathy given history. Other potential etiologies, such as lymphoma, are considered less likely given the history. No evidence of abscess.    Scheduled Meds: . ampicillin (OMNIPEN) IV  2 g Intravenous 4 times per day  . enoxaparin (LOVENOX) injection  40 mg Subcutaneous Q24H  . folic acid  2 mg Oral Daily  . HYDROmorphone PCA 2 mg/mL   Intravenous 6 times per day  . methadone  5 mg Oral QHS  . prenatal multivitamin  1 tablet Oral Q1200  . senna-docusate  1 tablet Oral BID   Continuous Infusions: . dextrose 5 % and 0.45% NaCl 50 mL/hr at 05/05/14 1125    Active Problems:   Sickle cell pain crisis   Assessment/Plan: Active Problems:   Sickle cell pain crisis  1. Sickle Cell Crisis: Pt still complains of pain in her back that is typical of her crisis. Her pain has improved even more today. Will decrease her Dilaudid PCA to 0.5mg  q 10 min. Continue Methadone for long acting control.  2. Strep Pharyngitis- Continue Ampicillin. Consulted ID this morning for recs on if IV antibiotics at home is appropriate for a pt who has failed previous PO treatment and IM Bicillin. ID recommended HIV antibody-pending, and no further antibiotics. This is the first day that pt looks clinically improved. Will continue Ampicillin and consider switch to Amoxicillin PO in the morning if further improved. Day 4 of antibiotics. 3. Sickle Cell Care: Continue Folic acid and hydroxyurea 4. Anemia: Hgb has gone from 6.8 to 6.1 in the past day. Likely dilutional as all cell lines have decreased. Her LDH is low. She denies any hematochezia or melena but will send stool occult to  r/o GI blood loss. Monitor. Will transfuse if hgb<5.5 or symptomatic.   5. FEN/GI : Hyponatremia- resolved. Hypokalemia-Given KCl 40 meq IV this morning for a potasium of 2.8. Recheck this afternoon and replete if necessary. Regular diet IV fluids-Stop NS and start D5/0.45 at half maintenance (32ml/hr) as pt is starting to eat and drink more. Bowel regimen in place  Code Status: full DVT Prophylaxis: enoxaparin Family Communication:  none Disposition Plan: pending ability to manage pain off IV  Kalman Shan  Pager [phone]. If 7PM-7AM, please contact night-coverage.  05/05/2014, 4:44 PM  LOS: 3 days   Kalman Shan

## 2014-05-05 NOTE — Consult Note (Addendum)
[facility]  Total days of antibiotics 4        Day 4 ampicillin               Reason for Consult: pharyngitis    Referring Physician: agboola  Active Problems:   Sickle cell pain crisis    HPI: [name] is a 23 y.o. 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 She was treated with bicillin plus amoxicillin to complete 10 day course. She was seen in follow up in the sickle cell clinic on 12/15 and at that time she complained of increasing pain as well as sore throat. She was treated with pain meds, IVF to circumvent pain crisis, but also treated with IM bicillin at that time due to ? tonsilar exudate concerning for refractory/recurrent GAS pharyngitis. She is admitted on 12/18 for leukocytosis of 20K, fever, complain of difficulty swallowing which triggered her SCD pain crisis. She was admitted given depot bicillin on 12/18 and kept on ampicillin there after. Leukocytosis and fever have improved while hospitalized. ID asked to provide input. She states that she is feeling better than when she was admitted. She is hungry and about to eat french fries. She states that she has left sided neck pain  Past Medical History  Diagnosis Date  . H/O: 1 miscarriage 03/22/2011    Pt reports 2 miscarriages.  [name] 01/08/2009  . Depression 01/06/2011  . GERD (gastroesophageal reflux disease) 02/17/2011  . Trichotillomania     h/o  . Blood transfusion     "lots"  . Sickle cell anemia with crisis   . Exertional dyspnea     "sometimes"  . Sickle cell anemia   . Headache(784.0)   . Migraines 11/08/11    "@ least twice/month"  . Chronic back pain     "very severe; have knot in my back; from tight muscle; take RX and exercise for it"  . Mood swings 11/08/11    "I go back and forth; real bad"  . Pregnancy   . Blood dyscrasia     SICKLE CELL    Allergies:    Allergies  Allergen Reactions  . Carrot Oil Hives and Swelling  . Latex Rash     MEDICATIONS: . ampicillin (OMNIPEN) IV  2 g Intravenous 4 times per day  . enoxaparin (LOVENOX) injection  40 mg Subcutaneous Q24H  . folic acid  2 mg Oral Daily  . HYDROmorphone PCA 2 mg/mL   Intravenous 6 times per day  . methadone  5 mg Oral QHS  . prenatal multivitamin  1 tablet Oral Q1200  . senna-docusate  1 tablet Oral BID    History  Substance Use Topics  . Smoking status: Former Smoker -- 0.25 packs/day for 1 years    Types: Cigarettes    Quit date: 03/25/2013  . Smokeless tobacco: Never Used  . Alcohol Use: No     Comment: pt states she quit marijuan in May 2013. Rare ETOH, + cigarettes.  She is enrolled in school    Family History  Problem Relation Age of Onset  . Diabetes Mother   . Alcoholism    . Depression    . Hypercholesterolemia    . Hypertension    . Migraines    . Diabetes Maternal Grandmother   . Diabetes Paternal Grandmother    Review of Systems  Constitutional: Negative for fever, chills, diaphoresis, activity change, appetite change, fatigue and unexpected weight change.  HENT: +sore throat, and neck pain. Negative for congestion, sore throat, rhinorrhea, sneezing, trouble swallowing and sinus pressure.  Eyes: Negative for photophobia and visual disturbance.  Respiratory: Negative for cough, chest tightness, shortness of breath, wheezing and stridor.  Cardiovascular: Negative for chest pain, palpitations and leg swelling.  Gastrointestinal: Negative for nausea, vomiting, abdominal pain, diarrhea, constipation, blood in stool, abdominal distention and anal bleeding.  Genitourinary: Negative for dysuria, hematuria, flank pain and difficulty urinating.  Musculoskeletal: Negative for myalgias, back pain, joint swelling, arthralgias and gait problem.  Skin: Negative for color change, pallor, rash and wound.  Neurological: Negative for dizziness, tremors, weakness and  light-headedness.  Hematological: Negative for adenopathy. Does not bruise/bleed easily.  Psychiatric/Behavioral: Negative for behavioral problems, confusion, sleep disturbance, dysphoric mood, decreased concentration and agitation.     OBJECTIVE: Temp:  [97.8 F (36.6 C)-98.7 F (37.1 C)] 98 F (36.7 C) (12/21 1342) Pulse Rate:  [86-94] 88 (12/21 1342) Resp:  [12-18] 16 (12/21 1342) BP: (95-108)/(49-64) 100/64 mmHg (12/21 1342) SpO2:  [97 %-100 %] 100 % (12/21 1342) FiO2 (%):  [21 %] 21 % (12/21 0008) Physical Exam  Constitutional:  oriented to person, place, and time. appears well-developed and well-nourished. No distress.  HENT:  Mouth/Throat: Oropharynx is clear and moist. Tonsils are not inflammed or swollen. She does have exudate on right palantine tonsil but ? Food debris. No other exudates. No lymphadenopathy.  Cardiovascular: Normal rate, regular rhythm and normal heart sounds. Exam reveals no gallop and no friction rub.  No murmur heard.  Pulmonary/Chest: Effort normal and breath sounds normal. No respiratory distress.  has no wheezes.  Abdominal: Soft. Bowel sounds are normal.  exhibits no distension. There is no tenderness.  Lymphadenopathy: no cervical adenopathy.  Neurological: alert and oriented to person, place, and time.  Skin: Skin is warm and dry. No rash noted. No erythema.  Psychiatric: a normal mood and affect. behavior is normal.   LABS: Results for orders placed or performed during the hospital encounter of 05/02/14 (from the past 48 hour(s))  Basic metabolic panel     Status: Abnormal   Collection Time: 05/04/14  5:30 AM  Result Value Ref Range   Sodium 135 (L) 137 - 147 mEq/L   Potassium 3.3 (L) 3.7 - 5.3 mEq/L   Chloride 97 96 - 112 mEq/L   CO2 25 19 - 32 mEq/L   Glucose, Bld 88 70 - 99 mg/dL   BUN 4 (L) 6 - 23 mg/dL   Creatinine, Ser 0.58 0.50 - 1.10 mg/dL   Calcium 8.8 8.4 - 10.5 mg/dL   GFR calc non Af Amer >90 >90 mL/min   GFR calc Af Amer >90  >90 mL/min    Comment: (NOTE) The eGFR has been calculated using the CKD EPI equation. This calculation has not been validated in all clinical situations. eGFR's persistently <90 mL/min signify possible Chronic Kidney Disease.    Anion gap 13 5 - 15  Hemoglobin and hematocrit, blood     Status: Abnormal   Collection Time: 05/04/14  5:30 AM  Result Value Ref Range   Hemoglobin 6.9 (LL) 12.0 - 15.0 g/dL    Comment: REPEATED TO VERIFY CRITICAL RESULT CALLED TO, READ BACK BY AND VERIFIED WITH: J.MALNFELT,RN AT 5456 ON 05/04/14 BY [email or web address]    HCT 20.0 (L) 36.0 - 25.6 %  Basic metabolic panel     Status: Abnormal  Collection Time: 05/05/14  5:57 AM  Result Value Ref Range   Sodium 139 137 - 147 mEq/L   Potassium 2.8 (LL) 3.7 - 5.3 mEq/L    Comment: CRITICAL RESULT CALLED TO, READ BACK BY AND VERIFIED WITH: N.ACHEA,RN AT 1610 ON 05/05/14 BY [email or web address]    Chloride 104 96 - 112 mEq/L   CO2 24 19 - 32 mEq/L   Glucose, Bld 83 70 - 99 mg/dL   BUN 4 (L) 6 - 23 mg/dL   Creatinine, Ser 0.55 0.50 - 1.10 mg/dL   Calcium 7.9 (L) 8.4 - 10.5 mg/dL   GFR calc non Af Amer >90 >90 mL/min   GFR calc Af Amer >90 >90 mL/min    Comment: (NOTE) The eGFR has been calculated using the CKD EPI equation. This calculation has not been validated in all clinical situations. eGFR's persistently <90 mL/min signify possible Chronic Kidney Disease.    Anion gap 11 5 - 15  Reticulocytes     Status: Abnormal   Collection Time: 05/05/14  5:57 AM  Result Value Ref Range   Retic Ct Pct 5.9 (H) 0.4 - 3.1 %   RBC. 1.99 (L) 3.87 - 5.11 MIL/uL   Retic Count, Manual 117.4 19.0 - 186.0 K/uL  CBC with Differential     Status: Abnormal   Collection Time: 05/05/14  5:57 AM  Result Value Ref Range   WBC 13.6 (H) 4.0 - 10.5 K/uL   RBC 1.99 (L) 3.87 - 5.11 MIL/uL   Hemoglobin 6.1 (LL) 12.0 - 15.0 g/dL    Comment: REPEATED TO VERIFY CRITICAL RESULT CALLED TO, READ BACK BY AND VERIFIED WITH: NADINE ACHEA,RN [id number] @ 0621 BY  J SCOTTON    HCT 17.3 (L) 36.0 - 46.0 %   MCV 86.9 78.0 - 100.0 fL   MCH 30.7 26.0 - 34.0 pg   MCHC 35.3 30.0 - 36.0 g/dL   RDW 20.5 (H) 11.5 - 15.5 %   Platelets 303 150 - 400 K/uL   Neutrophils Relative % 70 43 - 77 %   Lymphocytes Relative 17 12 - 46 %   Monocytes Relative 11 3 - 12 %   Eosinophils Relative 2 0 - 5 %   Basophils Relative 0 0 - 1 %   Neutro Abs 9.5 (H) 1.7 - 7.7 K/uL   Lymphs Abs 2.3 0.7 - 4.0 K/uL   Monocytes Absolute 1.5 (H) 0.1 - 1.0 K/uL   Eosinophils Absolute 0.3 0.0 - 0.7 K/uL   Basophils Absolute 0.0 0.0 - 0.1 K/uL   RBC Morphology POLYCHROMASIA PRESENT     Comment: TARGET CELLS  Lactate dehydrogenase     Status: None   Collection Time: 05/05/14  5:57 AM  Result Value Ref Range   LDH 203 94 - 250 U/L    MICRO: 12/18 blood cx ngtd  IMAGING: Ir Fluoro Guide Cv Line Left  05/03/2014   CLINICAL DATA:  23 year old with sickle cell crisis. Patient needs IV antibiotics.  EXAM: PLACEMENT OF PICC LINE WITH ULTRASOUND AND FLUOROSCOPIC GUIDANCE  FLUOROSCOPY TIME:  12 seconds  TECHNIQUE: The procedure was explained to the patient. The risks and benefits of the procedure were discussed and the patient's questions were addressed. Informed consent was obtained from the patient. In particular, the risks of fluoroscopy and pregnancy were discussed with the patient. The patient's pelvis was shielded with a lead apron for this procedure.  The right arm was prepped with chlorhexidine, draped in the usual sterile fashion using maximum barrier  technique (cap and mask, sterile gown, sterile gloves, large sterile sheet, hand hygiene and cutaneous antiseptic). Local anesthesia was attained by infiltration with 1% lidocaine.  Ultrasound demonstrated patency of a right brachia vein. Under real-time ultrasound guidance, this vein was accessed with a 21 gauge micropuncture needle. Wire would not advance centrally.  Attention was directed to the left arm. Ultrasound confirmed a patent  left brachial vein. The left arm was prepped and draped in sterile fashion. Maximal barrier sterile technique was utilized including caps, mask, sterile gowns, sterile gloves, sterile drape, hand hygiene and skin antiseptic. 1% lidocaine was used for local anesthetic. 21 gauge needle directed into a left brachial vein with ultrasound guidance. Wire was advanced centrally. A peel-away sheath was placed. A dual lumen 5 [place] [employer] PICC line was cut to 37 cm. Catheter was advanced into the central venous system with fluoroscopy. Tip placed at the superior cavoatrial junction. Both lumens aspirated and flushed well. Catheter was secured to the skin.  COMPLICATIONS: None.  The patient tolerated the procedure well.  IMPRESSION: Successful placement of a left arm PICC with sonographic and fluoroscopic guidance. The catheter is ready for use.  Please note that a PICC line could not be successfully placed from the right arm.   Electronically Signed   By: Markus Daft M.D.   On: 05/03/2014 18:51   Ir US Guide Vasc Access Left  05/03/2014   CLINICAL DATA:  23 year old with sickle cell crisis. Patient needs IV antibiotics.  EXAM: PLACEMENT OF PICC LINE WITH ULTRASOUND AND FLUOROSCOPIC GUIDANCE  FLUOROSCOPY TIME:  12 seconds  TECHNIQUE: The procedure was explained to the patient. The risks and benefits of the procedure were discussed and the patient's questions were addressed. Informed consent was obtained from the patient. In particular, the risks of fluoroscopy and pregnancy were discussed with the patient. The patient's pelvis was shielded with a lead apron for this procedure.  The right arm was prepped with chlorhexidine, draped in the usual sterile fashion using maximum barrier technique (cap and mask, sterile gown, sterile gloves, large sterile sheet, hand hygiene and cutaneous antiseptic). Local anesthesia was attained by infiltration with 1% lidocaine.  Ultrasound demonstrated patency of a right brachia vein. Under  real-time ultrasound guidance, this vein was accessed with a 21 gauge micropuncture needle. Wire would not advance centrally.  Attention was directed to the left arm. Ultrasound confirmed a patent left brachial vein. The left arm was prepped and draped in sterile fashion. Maximal barrier sterile technique was utilized including caps, mask, sterile gowns, sterile gloves, sterile drape, hand hygiene and skin antiseptic. 1% lidocaine was used for local anesthetic. 21 gauge needle directed into a left brachial vein with ultrasound guidance. Wire was advanced centrally. A peel-away sheath was placed. A dual lumen 5 [place] [employer] PICC line was cut to 37 cm. Catheter was advanced into the central venous system with fluoroscopy. Tip placed at the superior cavoatrial junction. Both lumens aspirated and flushed well. Catheter was secured to the skin.  COMPLICATIONS: None.  The patient tolerated the procedure well.  IMPRESSION: Successful placement of a left arm PICC with sonographic and fluoroscopic guidance. The catheter is ready for use.  Please note that a PICC line could not be successfully placed from the right arm.   Electronically Signed   By: Markus Daft M.D.   On: 05/03/2014 18:51   Mri of neck 12/8: Marked left greater than right tonsillar enlargement with enlarged bilateral cervical and superior mediastinal lymph nodes, most consistent  with tonsillitis and reactive lymphadenopathy given history. Other potential etiologies, such as lymphoma, are considered less likely given the history. No evidence of abscess.  Assessment/Plan:  23yo F with HB SS presents with fever, leukocytosis and sore throat, in setting of recurrent treatment for GAS pharyngitis.   pharyngitis- continue with supportive care. Mentioned that hard, crunchy foods may cause worsening throat pain. Can use chloraseptic spray vs. Magic mouthwash if she has moderate throat pain - would not necessarily think she would need further antibiotics  since she has received PCN depot.  First trimester pregnancy - will retest for HIV due to pregnancy and last tested in 2011.  Hypokalemia - has repeat K serum  this early evening to ensure that she is getting repleated adequately  [facility] [phone]

## 2014-05-05 NOTE — Progress Notes (Signed)
[facility]d notified about HBG 6.1.No new orders at this time.

## 2014-05-05 NOTE — Progress Notes (Signed)
CRITICAL VALUE ALERT  Critical value received:  HBG 6.1    Date of notification:  05/05/2014  Time of notification:  0640  Critical value read back: yes  Nurse who received alert: [name]  MD notified (1st page): Rogue Bussing  Time of first page:  0645  MD notified (2nd page):  Time of second page:  Responding MD:    Time MD responded:

## 2014-05-06 DIAGNOSIS — Z331 Pregnant state, incidental: Secondary | ICD-10-CM

## 2014-05-06 DIAGNOSIS — J029 Acute pharyngitis, unspecified: Secondary | ICD-10-CM

## 2014-05-06 LAB — CBC WITH DIFFERENTIAL/PLATELET
BASOS ABS: 0.1 10*3/uL (ref 0.0–0.1)
Basophils Relative: 1 % (ref 0–1)
EOS PCT: 3 % (ref 0–5)
Eosinophils Absolute: 0.4 10*3/uL (ref 0.0–0.7)
HEMATOCRIT: 19.2 % — AB (ref 36.0–46.0)
Hemoglobin: 6.6 g/dL — CL (ref 12.0–15.0)
LYMPHS ABS: 2.7 10*3/uL (ref 0.7–4.0)
LYMPHS PCT: 22 % (ref 12–46)
MCH: 29.9 pg (ref 26.0–34.0)
MCHC: 34.4 g/dL (ref 30.0–36.0)
MCV: 86.9 fL (ref 78.0–100.0)
MONOS PCT: 10 % (ref 3–12)
Monocytes Absolute: 1.2 10*3/uL — ABNORMAL HIGH (ref 0.1–1.0)
NEUTROS ABS: 7.8 10*3/uL — AB (ref 1.7–7.7)
Neutrophils Relative %: 64 % (ref 43–77)
PLATELETS: 379 10*3/uL (ref 150–400)
RBC: 2.21 MIL/uL — AB (ref 3.87–5.11)
RDW: 20.6 % — ABNORMAL HIGH (ref 11.5–15.5)
WBC: 12.2 10*3/uL — AB (ref 4.0–10.5)

## 2014-05-06 LAB — BASIC METABOLIC PANEL
Anion gap: 7 (ref 5–15)
BUN: <5 mg/dL — ABNORMAL LOW (ref 6–23)
CALCIUM: 8.5 mg/dL (ref 8.4–10.5)
CO2: 29 mmol/L (ref 19–32)
CREATININE: 0.47 mg/dL — AB (ref 0.50–1.10)
Chloride: 99 mEq/L (ref 96–112)
GFR calc Af Amer: >90 mL/min (ref >90)
GFR calc non Af Amer: >90 mL/min (ref >90)
Glucose, Bld: 96 mg/dL (ref 70–99)
Potassium: 3.4 mmol/L — ABNORMAL LOW (ref 3.5–5.1)
Sodium: 135 mmol/L (ref 135–145)

## 2014-05-06 LAB — LACTATE DEHYDROGENASE: LDH: 195 U/L (ref 94–250)

## 2014-05-06 LAB — RETICULOCYTES
RBC.: 2.21 MIL/uL — ABNORMAL LOW (ref 3.87–5.11)
Retic Count, Absolute: 130.4 10*3/uL (ref 19.0–186.0)
Retic Ct Pct: 5.9 % — ABNORMAL HIGH (ref 0.4–3.1)

## 2014-05-06 LAB — PREPARE RBC (CROSSMATCH)

## 2014-05-06 LAB — OCCULT BLOOD X 1 CARD TO LAB, STOOL: Fecal Occult Bld: POSITIVE — AB

## 2014-05-06 MED ORDER — HEPARIN SOD (PORK) LOCK FLUSH 100 UNIT/ML IV SOLN: 250.0000 [IU] | INTRAVENOUS | Status: DC | PRN

## 2014-05-06 MED ORDER — OXYCODONE HCL 10 MG PO TABS: 15.0000 mg | ORAL_TABLET | Freq: Four times a day (QID) | ORAL | Status: DC | PRN

## 2014-05-06 MED ORDER — SODIUM CHLORIDE 0.9 % IV SOLN: Freq: Once | INTRAVENOUS | Status: DC

## 2014-05-06 MED ORDER — SODIUM CHLORIDE 0.9 % IV SOLN
Freq: Once | INTRAVENOUS | Status: AC
  Administered 2014-05-06: 13:00:00 via INTRAVENOUS

## 2014-05-06 MED ORDER — METHADONE HCL 5 MG PO TABS: 5.0000 mg | ORAL_TABLET | Freq: Every day | ORAL | Status: DC

## 2014-05-06 NOTE — Discharge Summary (Signed)
[name] MRN: [id number] DOB/AGE: 1990/08/08 23 y.o.  Admit date: 05/02/2014 Discharge date: 05/06/2014  Primary Care Physician:  Kla Bily A., MD   Discharge Diagnoses:   Patient Active Problem List   Diagnosis Date Noted  . Mood swings 11/08/2011    Priority: High  . Depression 01/06/2011    Priority: High  . Sickle cell disease 01/08/2009    Priority: High  . Anemia 10/05/2012    Priority: Medium  . 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 Pharyngitis   . Pregnant 05/03/2014  . Low grade fever 04/29/2014  . Sore throat 04/29/2014  . Pregnancy at early stage 04/29/2014  . Acute bacterial pharyngitis 04/15/2014  . Tonsillar exudate 04/15/2014  . Sickle cell anemia 01/29/2014  . Tobacco dependence 08/30/2013  . Frequent stools 08/27/2013  . Pain, joint, upper arm, right 08/27/2013  . Right groin pain 08/15/2013  . Nausea alone 08/15/2013  . Abscess of right groin 08/15/2013  . Sickle cell crisis 07/31/2013  . Hyperkalemia 07/02/2013  . Foul smelling vaginal discharge 06/19/2013  . OCP (oral contraceptive pills) initiation 06/19/2013  . Unprotected sexual intercourse 06/19/2013  . Leg pain 05/28/2013  . Sickle cell anemia with pain 05/28/2013  . Unspecified constipation 04/25/2013  . 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 Chronic pain 02/26/2013  . Immunization due 02/26/2013  . Protein-calorie malnutrition, severe 02/11/2013  . Streptococcal sore throat 12/31/2012    Class: Acute  . Fever, unspecified 10/06/2012  . Leukocytosis, unspecified 10/05/2012  . Sickle cell anemia with crisis   . Chronic back pain   . Leukocytosis 03/31/2012  . 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 Sickle cell pain crisis 03/29/2012  . Headache 02/13/2012  . Nausea & vomiting 02/13/2012  . Tachycardia 11/22/2011  . Right thigh pain 11/08/2011  . Migraines 11/08/2011  . Vaso-occlusive sickle cell crisis 10/08/2011  .  Overweight(278.02) 05/24/2011  . Stress 04/29/2011  . GERD (gastroesophageal reflux disease) 02/17/2011  . Back pain 09/17/2010  . Active smoker 08/09/2010  . 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     folic acid 1 MG tablet  Commonly known as:  FOLVITE  Take 2 tablets (2 mg total) by mouth daily.     methadone 5 MG tablet  Commonly known as:  DOLOPHINE  Take 1 tablet (5 mg total) by mouth at bedtime.     Oxycodone HCl 10 MG Tabs  Take 1.5 tablets (15 mg total) by mouth every 6 (six) hours as needed (moderate pain).     prenatal multivitamin Tabs tablet  Take 1 tablet by mouth daily at 12 noon.          Consults:     SIGNIFICANT DIAGNOSTIC STUDIES:  Dg Neck Soft Tissue  04/16/2014   CLINICAL DATA:  Stridor.  Group a strep.  Initial encounter.  EXAM: NECK SOFT TISSUES - 1+ VIEW  COMPARISON:  None.  FINDINGS: Levoconvex torticollis is present. The prevertebral soft tissues appear within normal limits. The epiglottis is normal.  IMPRESSION: 1. Normal epiglottis. 2. Mild levoconvex torticollis.   Electronically Signed   By: Dereck Ligas M.D.   On: 04/16/2014 14:58   Dg Chest 2 View  04/15/2014   CLINICAL DATA:  Cough, fever, history of pneumonia and strep throat, sickle cell disease  EXAM: CHEST  2 VIEW  COMPARISON:  04/14/2014  FINDINGS: RIGHT arm PICC line with tip projecting over mid SVC.  Enlargement of cardiac silhouette with pulmonary vascular congestion.  RIGHT upper lobe volume loss.  No gross infiltrate, pleural effusion, or pneumothorax.  Bones unremarkable.  IMPRESSION: Enlargement of cardiac silhouette with pulmonary vascular congestion consistent with history of sickle cell disease.  No acute infiltrate.   Electronically Signed   By: Lavonia Dana  M.D.   On: 04/15/2014 15:25   Mr Neck Soft Tissue Only Wo Contrast  05/02/2014   CLINICAL DATA:  Pharyngitis. Group A strep for 1 week. Worsening neck pain and neck swelling. History of sickle cell anemia. No contrast was administered as the patient is [redacted] weeks pregnant.  EXAM: MR NECK SOFT TISSUE ONLY WITHOUT CONTRAST  TECHNIQUE: Multiplanar, multisequence MR imaging was performed. No intravenous contrast was administered.  COMPARISON:  None.  FINDINGS: Images are mildly degraded by motion artifact intermittently.  There is moderate, symmetric enlargement of the adenoid tonsils. There is marked enlargement of the palatine tonsils, left greater than right, with mild prominence of the lingual tonsils as well. Tonsillar enlargement results in prominent narrowing of the oropharyngeal airway. No tonsillar or peritonsillar fluid collection is identified. Trace retained fluid is present in the nasopharynx. Larynx is unremarkable.  Enlarged bilateral level II lymph nodes measure up to 1.9 cm in short axis bilaterally. Left level III and IV lymph nodes measure 9 and 7 mm, respectively. Small bilateral level V lymph nodes are also present. Enlarged prevascular lymph nodes in the superior mediastinum measure up to 1.4 cm in short axis.  Thyroid current as glands that thyroid gland is grossly unremarkable. The submandibular and parotid glands are unremarkable. Visualized portion the brain is unremarkable. Major vascular flow voids of the neck appear preserved. The diffusely diminished bone marrow signal intensity is consistent with history of sickle cell anemia.  IMPRESSION: Marked left greater than right tonsillar enlargement with enlarged bilateral cervical and superior mediastinal lymph nodes, most consistent with tonsillitis and reactive lymphadenopathy given history. Other potential etiologies, such as lymphoma, are considered less likely given the history. No evidence of abscess.   Electronically Signed   By: Logan Bores   On: 05/02/2014 12:43   US Abdomen Limited  04/16/2014   CLINICAL DATA:  Abdominal wall abscess. Sickle cell crisis. Abdominal wall pain.  EXAM: LIMITED ABDOMINAL ULTRASOUND  COMPARISON:  None.  FINDINGS: Scanning was performed over the area of pain. There was no abscess. No subcutaneous edema was identified. The abdominal wall appears normal.  IMPRESSION: Negative.   Electronically Signed   By: Dereck Ligas M.D.   On: 04/16/2014 14:44   Ir Fluoro Guide Cv Line Left  05/03/2014   CLINICAL DATA:  23 year old with sickle cell crisis. Patient needs IV antibiotics.  EXAM: PLACEMENT OF PICC LINE WITH ULTRASOUND AND FLUOROSCOPIC GUIDANCE  FLUOROSCOPY TIME:  12 seconds  TECHNIQUE: The procedure was explained to the patient. The risks and benefits of the procedure were discussed and the patient's questions were addressed. Informed consent was obtained from the patient. In particular, the risks of fluoroscopy and pregnancy were discussed with the patient. The patient's pelvis was shielded with a lead apron for this procedure.  The right arm was prepped with chlorhexidine, draped in the usual sterile fashion using maximum barrier technique (cap and mask, sterile gown, sterile  gloves, large sterile sheet, hand hygiene and cutaneous antiseptic). Local anesthesia was attained by infiltration with 1% lidocaine.  Ultrasound demonstrated patency of a right brachia vein. Under real-time ultrasound guidance, this vein was accessed with a 21 gauge micropuncture needle. Wire would not advance centrally.  Attention was directed to the left arm. Ultrasound confirmed a patent left brachial vein. The left arm was prepped and draped in sterile fashion. Maximal barrier sterile technique was utilized including caps, mask, sterile gowns, sterile gloves, sterile drape, hand hygiene and skin antiseptic. 1% lidocaine was used for local anesthetic. 21 gauge needle directed into a left brachial vein with ultrasound guidance. Wire  was advanced centrally. A peel-away sheath was placed. A dual lumen 5 [place] Power PICC line was cut to 37 cm. Catheter was advanced into the central venous system with fluoroscopy. Tip placed at the superior cavoatrial junction. Both lumens aspirated and flushed well. Catheter was secured to the skin.  COMPLICATIONS: None.  The patient tolerated the procedure well.  IMPRESSION: Successful placement of a left arm PICC with sonographic and fluoroscopic guidance. The catheter is ready for use.  Please note that a PICC line could not be successfully placed from the right arm.   Electronically Signed   By: Markus Daft M.D.   On: 05/03/2014 18:51   Ir Fluoro Guide Cv Line Right  04/14/2014   CLINICAL DATA:  Sickle cell crisis, acute pain crisis, no current access  EXAM: POWER PICC LINE PLACEMENT WITH ULTRASOUND AND FLUOROSCOPIC GUIDANCE  FLUOROSCOPY TIME:  12 seconds  PROCEDURE: The patient was advised of the possible risks andcomplications and agreed to undergo the procedure. The patient was then brought to the angiographic suite for the procedure.  The right arm was prepped with chlorhexidine, drapedin the usual sterile fashion using maximum barrier technique (cap and mask, sterile gown, sterile gloves, large sterile sheet, hand hygiene and cutaneous antisepsis) and infiltrated locally with 1% Lidocaine.  Ultrasound demonstrated patency of the right brachial vein, and this was documented with an image. Under real-time ultrasound guidance, this vein was accessed with a 21 gauge micropuncture needle and image documentation was performed. A 0.018 wire was introduced in to the vein. Over this, a 5 [place] double lumen power PICC was advanced to the lower SVC/right atrial junction. Fluoroscopy during the procedure and fluoro spot radiograph confirms appropriate catheter position. The catheter was flushed and covered with asterile dressing.  Catheter length: 38  Complications: None immediate  IMPRESSION: Successful right  arm power PICC line placement with ultrasound and fluoroscopic guidance. The catheter is ready for use.   Electronically Signed   By: Daryll Brod M.D.   On: 04/14/2014 08:18   Ir US Guide Vasc Access Left  05/03/2014   CLINICAL DATA:  23 year old with sickle cell crisis. Patient needs IV antibiotics.  EXAM: PLACEMENT OF PICC LINE WITH ULTRASOUND AND FLUOROSCOPIC GUIDANCE  FLUOROSCOPY TIME:  12 seconds  TECHNIQUE: The procedure was explained to the patient. The risks and benefits of the procedure were discussed and the patient's questions were addressed. Informed consent was obtained from the patient. In particular, the risks of fluoroscopy and pregnancy were discussed with the patient. The patient's pelvis was shielded with a lead apron for this procedure.  The right arm was prepped with chlorhexidine, draped in the usual sterile fashion using maximum barrier technique (cap and mask, sterile gown, sterile gloves, large sterile sheet, hand hygiene and cutaneous antiseptic). Local anesthesia was attained by infiltration with 1% lidocaine.  Ultrasound  demonstrated patency of a right brachia vein. Under real-time ultrasound guidance, this vein was accessed with a 21 gauge micropuncture needle. Wire would not advance centrally.  Attention was directed to the left arm. Ultrasound confirmed a patent left brachial vein. The left arm was prepped and draped in sterile fashion. Maximal barrier sterile technique was utilized including caps, mask, sterile gowns, sterile gloves, sterile drape, hand hygiene and skin antiseptic. 1% lidocaine was used for local anesthetic. 21 gauge needle directed into a left brachial vein with ultrasound guidance. Wire was advanced centrally. A peel-away sheath was placed. A dual lumen 5 [place] Power PICC line was cut to 37 cm. Catheter was advanced into the central venous system with fluoroscopy. Tip placed at the superior cavoatrial junction. Both lumens aspirated and flushed well. Catheter  was secured to the skin.  COMPLICATIONS: None.  The patient tolerated the procedure well.  IMPRESSION: Successful placement of a left arm PICC with sonographic and fluoroscopic guidance. The catheter is ready for use.  Please note that a PICC line could not be successfully placed from the right arm.   Electronically Signed   By: Markus Daft M.D.   On: 05/03/2014 18:51   Ir US Guide Vasc Access Right  04/14/2014   CLINICAL DATA:  Sickle cell crisis, acute pain crisis, no current access  EXAM: POWER PICC LINE PLACEMENT WITH ULTRASOUND AND FLUOROSCOPIC GUIDANCE  FLUOROSCOPY TIME:  12 seconds  PROCEDURE: The patient was advised of the possible risks andcomplications and agreed to undergo the procedure. The patient was then brought to the angiographic suite for the procedure.  The right arm was prepped with chlorhexidine, drapedin the usual sterile fashion using maximum barrier technique (cap and mask, sterile gown, sterile gloves, large sterile sheet, hand hygiene and cutaneous antisepsis) and infiltrated locally with 1% Lidocaine.  Ultrasound demonstrated patency of the right brachial vein, and this was documented with an image. Under real-time ultrasound guidance, this vein was accessed with a 21 gauge micropuncture needle and image documentation was performed. A 0.018 wire was introduced in to the vein. Over this, a 5 [place] double lumen power PICC was advanced to the lower SVC/right atrial junction. Fluoroscopy during the procedure and fluoro spot radiograph confirms appropriate catheter position. The catheter was flushed and covered with asterile dressing.  Catheter length: 38  Complications: None immediate  IMPRESSION: Successful right arm power PICC line placement with ultrasound and fluoroscopic guidance. The catheter is ready for use.   Electronically Signed   By: Daryll Brod M.D.   On: 04/14/2014 08:18   Dg Chest Port 1 View  04/14/2014   CLINICAL DATA:  Fever.  Sickle cell.  EXAM: PORTABLE CHEST - 1  VIEW  COMPARISON:  02/16/2014  FINDINGS: Bilateral airspace disease has developed since the prior study and may represent pneumonia. Atelectasis and fluid overload also possible. No effusion.  Cardiac enlargement.  Right arm PICC tip in the SVC  Bony changes of sickle cell.  IMPRESSION: Diffuse bilateral airspace disease may represent pneumonia or edema.   Electronically Signed   By: Franchot Gallo M.D.   On: 04/14/2014 07:39      Recent Results (from the past 240 hour(s))  Culture, blood (routine x 2)     Status: None (Preliminary result)   Collection Time: 05/02/14 12:08 PM  Result Value Ref Range Status   Specimen Description BLOOD LEFT HAND  Final   Special Requests BOTTLES DRAWN AEROBIC ONLY 1ML  Final   Culture  Setup Time  Final    05/02/2014 14:44 Performed at [facility]    Culture   Final           BLOOD CULTURE RECEIVED NO GROWTH TO DATE CULTURE WILL BE HELD FOR 5 DAYS BEFORE ISSUING A FINAL NEGATIVE REPORT Note: Culture results may be compromised due to an inadequate volume of blood received in culture bottles. Performed at [facility]    Report Status PENDING  Incomplete  Culture, blood (routine x 2)     Status: None (Preliminary result)   Collection Time: 05/02/14 12:16 PM  Result Value Ref Range Status   Specimen Description BLOOD LEFT HAND  Final   Special Requests BOTTLES DRAWN AEROBIC AND ANAEROBIC 3ML  Final   Culture  Setup Time   Final    05/02/2014 14:43 Performed at [facility]    Culture   Final           BLOOD CULTURE RECEIVED NO GROWTH TO DATE CULTURE WILL BE HELD FOR 5 DAYS BEFORE ISSUING A FINAL NEGATIVE REPORT Performed at [facility]    Report Status PENDING  Incomplete    BRIEF ADMITTING H & P: Pt well known with Hb SS and recent Group A strept infection presents to the ED with c/o throat pain and drooling x 11/2 days. She was treated with IM Bicillin in the office on 04/29/2014 after she had received a course  of amoxicillin and depot bicillin about 2 weeks ago. This was discussed with ID (Dr. Linus Salmons) who recommended re-treating with depot Bicillin. She comes in today with complaints of difficulty swallowing and drooling and foul smelling breath. She states that this triggered a crisis and she is now having pain typical of her SCD type pain. The pain is localized to the back and legs. She reports that she had one episode of emesis last night. However although she has not had significant emesis, she has not had much oral intake as it hurts to drink. She denies a fever but states that she has had chills. She denies any diarrhea, SOB, Abdominal pain. Of note, the patient is pregnant of about 6 weeks.  She reports that she came to the ED as her mother had to leave early for work and she would not otherwise have had a way to seek medical care.    Hospital Course:  Present on Admission:  . GAS Tonsillitis: Pt was admitted with Group A Strept tonsillitis after she failed out patient therapy. She was treated with depot Bicillin in addition to Iv ampicillin for 5 days. This after oral and IM therapy as an out patient. ID recommends no further Abx therapy. She was evaluated for severity of illness with an MRI which showed left tonsillar enlargement and enlarged cervical lymph nodes. The swelling however was never bad enough to require steroids.  Pt appears clinically well and is eating and drinking without difficulty.   . Sickle cell pain crisis: Crisis was triggered by tonsillar infection. Pt was treated with PCA and IVF. She was then transitioned to oral analgesics. At the time of discharge, she was ambulatory without difficulty. She was issued a prescription for Methadone 5 mg # 23 tabs and Oxycodone 10 mg #42 tabs.  . Anemia: Although patient is at a Hb level than under normal circumstances would not require transfusion, due to her pregnancy, she was  transfused 1 Unit PRBC to raise Hb to > 8 g/dL.  Disposition  and Follow-up:  Pt discharged in  good condition and she is to follow up in the clinic as scheduled for chronic care and medication refills.   DISCHARGE EXAM:  General: Alert, awake, oriented x3, in mild distress.  Vital Signs: BP 95/52, HR 86, T 98.4 F (36.9 C), temperature source Oral,RR 13, height 5\' 1"  (1.549 m), weight 168 lb 6.9 oz (76.4 kg), last menstrual period 03/20/2014, SpO2 94 %. HEENT: [place]/AT PEERL, EOMI, anicteric Neck: Trachea midline, no masses, no thyromegal,y no JVD, no carotid bruit OROPHARYNX: Moist, No exudate/ erythema/lesions.  Heart: Regular rate and rhythm, without murmurs, rubs, gallops, PMI non-displaced.  Lungs: Clear to auscultation, no wheezing or rhonchi noted.  Abdomen: Soft, nontender, nondistended, positive bowel sounds, no masses no hepatosplenomegaly noted.  Neuro: No focal neurological deficits noted cranial nerves II through XII grossly intact. DTRs 2+ bilaterally upper and lower extremities. Strength at baseline in bilateral upper and lower extremities. Musculoskeletal: No warm swelling or erythema around joints, no spinal tenderness noted. Psychiatric: Patient alert and oriented x3, good insight and cognition, good recent to remote recall. Lymph node survey: No cervical axillary or inguinal lymphadenopathy noted.     Recent Labs  05/05/14 0557 05/05/14 1550 05/06/14 0640  NA 139  --  135  K 2.8* 3.5* 3.4*  CL 104  --  99  CO2 24  --  29  GLUCOSE 83  --  96  BUN 4*  --  <5*  CREATININE 0.55  --  0.47*  CALCIUM 7.9*  --  8.5   No results for input(s): AST, ALT, ALKPHOS, BILITOT, PROT, ALBUMIN in the last 72 hours. No results for input(s): LIPASE, AMYLASE in the last 72 hours.  Recent Labs  05/05/14 0557 05/06/14 0640  WBC 13.6* 12.2*  NEUTROABS 9.5* 7.8*  HGB 6.1* 6.6*  HCT 17.3* 19.2*  MCV 86.9 86.9  PLT 303 379   Total time spent including face to face and decision making was greater than 30  minutes.  Signed: Duel Conrad A. 05/06/2014, 11:07 AM

## 2014-05-06 NOTE — Progress Notes (Signed)
Dilauded high concentration PCA wasted in sink. Dilauded 58ml/36mg  wasted. Witnessed by [name], RN

## 2014-05-06 NOTE — Care Management Note (Signed)
CARE MANAGEMENT NOTE 05/06/2014  Patient:  [name], [name]   Account Number:  [id number]  Date Initiated:  05/06/2014  Documentation initiated by:  Tamieka Rancourt  Subjective/Objective Assessment:   23 yo admitted with SCC. Pt is [redacted] wks pregnant.     Action/Plan:   From home with significant other   Anticipated DC Date:  05/09/2014   Anticipated DC Plan:  [place]  CM consult      Choice offered to / List presented to:             Status of service:  In process, will continue to follow Medicare Important Message given?   (If response is "NO", the following Medicare IM given date fields will be blank) Date Medicare IM given:   Medicare IM given by:   Date Additional Medicare IM given:   Additional Medicare IM given by:    Discharge Disposition:    Per UR Regulation:  Reviewed for med. necessity/level of care/duration of stay  If discussed at [place] of Stay Meetings, dates discussed:    Comments:  05/06/14 Marney Doctor RN,BSN,NCM [phone] Chart reviewed and CM following for DC needs.

## 2014-05-07 LAB — TYPE AND SCREEN
ABO/RH(D): B POS
Antibody Screen: NEGATIVE
DONOR AG TYPE: NEGATIVE
Unit division: 0

## 2014-05-08 LAB — CULTURE, BLOOD (ROUTINE X 2)
CULTURE: NO GROWTH
Culture: NO GROWTH

## 2014-05-12 LAB — HIV ANTIBODY (ROUTINE TESTING W REFLEX): HIV 1&2 Ab, 4th Generation: NONREACTIVE

## 2014-05-13 ENCOUNTER — Telehealth: Payer: Self-pay | Admitting: Internal Medicine

## 2014-05-13 DIAGNOSIS — G8929 Other chronic pain: Secondary | ICD-10-CM

## 2014-05-13 DIAGNOSIS — D571 Sickle-cell disease without crisis: Secondary | ICD-10-CM

## 2014-05-13 MED ORDER — OXYCODONE HCL 10 MG PO TABS: 15.0000 mg | ORAL_TABLET | Freq: Four times a day (QID) | ORAL | Status: DC | PRN

## 2014-05-13 NOTE — Telephone Encounter (Signed)
Refill request for oxycodone 10mg . LOV 04/29/2014. Please advise. Thanks!

## 2014-05-13 NOTE — Telephone Encounter (Signed)
Meds ordered this encounter  Medications  . Oxycodone HCl 10 MG TABS    Sig: Take 1.5 tablets (15 mg total) by mouth every 6 (six) hours as needed (moderate pain).    Dispense:  42 tablet    Refill:  0    Order Specific Question:  Supervising Provider    Answer:  Liston Alba A [[id number]]  Reviewed [place] Substance Reporting system prior to reorder Dorena Dew, FNP

## 2014-05-16 NOTE — L&D Delivery Note (Cosign Needed)
Delivery Note At 1:28 PM a viable female was delivered via Vaginal, Spontaneous Delivery (Presentation: Left Occiput Anterior).  APGAR: 9,9 ; weight  .   Placenta status: Intact, Spontaneous.  Cord: 3 vessels with the following complications: None.  Cord pH: n/a  Anesthesia: Epidural  Episiotomy: None Lacerations: None Suture Repair: n/a Est. Blood Loss (mL): 124  Mom to postpartum.  Baby to Couplet care / Skin to Skin.  Koren Shiver DARLENE 12/07/2014, 2:14 PM

## 2014-05-19 ENCOUNTER — Telehealth: Payer: Self-pay | Admitting: Internal Medicine

## 2014-05-19 DIAGNOSIS — D571 Sickle-cell disease without crisis: Secondary | ICD-10-CM

## 2014-05-19 DIAGNOSIS — G8929 Other chronic pain: Secondary | ICD-10-CM

## 2014-05-19 MED ORDER — OXYCODONE HCL 10 MG PO TABS: 15.0000 mg | ORAL_TABLET | Freq: Four times a day (QID) | ORAL | Status: DC | PRN

## 2014-05-19 NOTE — Telephone Encounter (Signed)
Refill request for oxycodone. LOV 04/29/14. Please advise. Thanks!

## 2014-05-19 NOTE — Telephone Encounter (Signed)
Meds ordered this encounter  Medications  . Oxycodone HCl 10 MG TABS    Sig: Take 1.5 tablets (15 mg total) by mouth every 6 (six) hours as needed (moderate pain).    Dispense:  42 tablet    Refill:  0    Order Specific Question:  Supervising Provider    Answer:  Liston Alba A [[id number]]  Reviewed [place] Substance Reporting system prior to reorder Dorena Dew, FNP

## 2014-05-20 ENCOUNTER — Other Ambulatory Visit: Payer: Medicare Other

## 2014-05-20 DIAGNOSIS — G8929 Other chronic pain: Secondary | ICD-10-CM

## 2014-05-23 ENCOUNTER — Ambulatory Visit: Payer: Medicare Other | Admitting: Family Medicine

## 2014-05-23 LAB — OPIATES/OPIOIDS (LC/MS-MS)
Codeine Urine: NEGATIVE ng/mL (ref <50)
HYDROMORPHONE: 87 ng/mL — AB (ref <50)
Hydrocodone: 72 ng/mL — AB (ref <50)
Morphine Urine: NEGATIVE ng/mL (ref <50)
Norhydrocodone, Ur: 169 ng/mL — AB (ref <50)
Noroxycodone, Ur: 113 ng/mL — AB (ref <50)
OXYCODONE, UR: NEGATIVE ng/mL (ref <50)
Oxymorphone: 94 ng/mL — AB (ref <50)

## 2014-05-23 LAB — METHADONE (GC/LC/MS), URINE
EDDP (GC/LC/MS), ur confirm: 927 ng/mL — AB (ref <100)
METHADONEUC: 649 ng/mL — AB (ref <100)

## 2014-05-23 LAB — OXYCODONE, URINE (LC/MS-MS)
Noroxycodone, Ur: 113 ng/mL — AB (ref <50)
Oxycodone, ur: NEGATIVE ng/mL (ref <50)
Oxymorphone: 94 ng/mL — AB (ref <50)

## 2014-05-24 LAB — PRESCRIPTION MONITORING PROFILE (SOLSTAS)
Amphetamine/Meth: NEGATIVE ng/mL
BENZODIAZEPINE SCREEN, URINE: NEGATIVE ng/mL
BUPRENORPHINE, URINE: NEGATIVE ng/mL
Barbiturate Screen, Urine: NEGATIVE ng/mL
CANNABINOID SCRN UR: NEGATIVE ng/mL
CARISOPRODOL, URINE: NEGATIVE ng/mL
COCAINE METABOLITES: NEGATIVE ng/mL
Creatinine, Urine: 71.16 mg/dL (ref >20.0)
ECSTASY: NEGATIVE ng/mL
FENTANYL URINE: NEGATIVE ng/mL
MEPERIDINE UR: NEGATIVE ng/mL
Nitrites, Initial: NEGATIVE ug/mL
Propoxyphene: NEGATIVE ng/mL
Tapentadol, urine: NEGATIVE ng/mL
Tramadol Scrn, Ur: NEGATIVE ng/mL
ZOLPIDEM, URINE: NEGATIVE ng/mL
pH, Initial: 5.4 pH (ref 4.5–8.9)

## 2014-05-26 ENCOUNTER — Other Ambulatory Visit: Payer: Self-pay | Admitting: Internal Medicine

## 2014-05-26 DIAGNOSIS — D571 Sickle-cell disease without crisis: Secondary | ICD-10-CM

## 2014-05-26 DIAGNOSIS — G8929 Other chronic pain: Secondary | ICD-10-CM

## 2014-05-26 NOTE — Telephone Encounter (Signed)
Refill request for Methadone 5mg  and oxycodone 10mg . LOV 04/29/2014. Please advise. Thanks!

## 2014-05-27 MED ORDER — OXYCODONE HCL 10 MG PO TABS: 15.0000 mg | ORAL_TABLET | Freq: Four times a day (QID) | ORAL | Status: DC | PRN

## 2014-05-27 MED ORDER — METHADONE HCL 5 MG PO TABS: 5.0000 mg | ORAL_TABLET | Freq: Every day | ORAL | Status: DC

## 2014-05-27 NOTE — Telephone Encounter (Signed)
Meds ordered this encounter  Medications  . Oxycodone HCl 10 MG TABS    Sig: Take 1.5 tablets (15 mg total) by mouth every 6 (six) hours as needed (moderate pain).    Dispense:  42 tablet    Refill:  0    Order Specific Question:  Supervising Provider    Answer:  MATTHEWS, MICHELLE A [[id number]]  . methadone (DOLOPHINE) 5 MG tablet    Sig: Take 1 tablet (5 mg total) by mouth at bedtime.    Dispense:  30 tablet    Refill:  0    Rx not to be filled prior to 05/30/2014    Order Specific Question:  Supervising Provider    Answer:  Liston Alba A [[id number]]  Reviewed [place] Substance Reporting system prior to reorder Dorena Dew, FNP

## 2014-05-29 ENCOUNTER — Telehealth: Payer: Self-pay | Admitting: Internal Medicine

## 2014-05-29 NOTE — Telephone Encounter (Signed)
Advised patient to call ob/gyn or consult with pharmacist. Patient verbalized understanding and states she will follow instructions. Thanks!

## 2014-05-29 NOTE — Telephone Encounter (Signed)
Patient is having migraines and needs to know what she can purchase OTC.

## 2014-05-30 ENCOUNTER — Telehealth: Payer: Self-pay | Admitting: Internal Medicine

## 2014-05-30 NOTE — Telephone Encounter (Signed)
Patient called to request letter to take leave of absence at work. Patient stated it has become harder for her to stand extended period of time.

## 2014-05-30 NOTE — Telephone Encounter (Signed)
[place], Please advise. Thanks!  

## 2014-05-31 IMAGING — CR DG ANKLE COMPLETE 3+V*R*
3 series · 3 of 3 positions shown · non-contrast
Comparison: None.

CLINICAL DATA: Foot pain after trauma

EXAM:
RIGHT ANKLE - COMPLETE 3+ VIEW

[x ankle lat right]
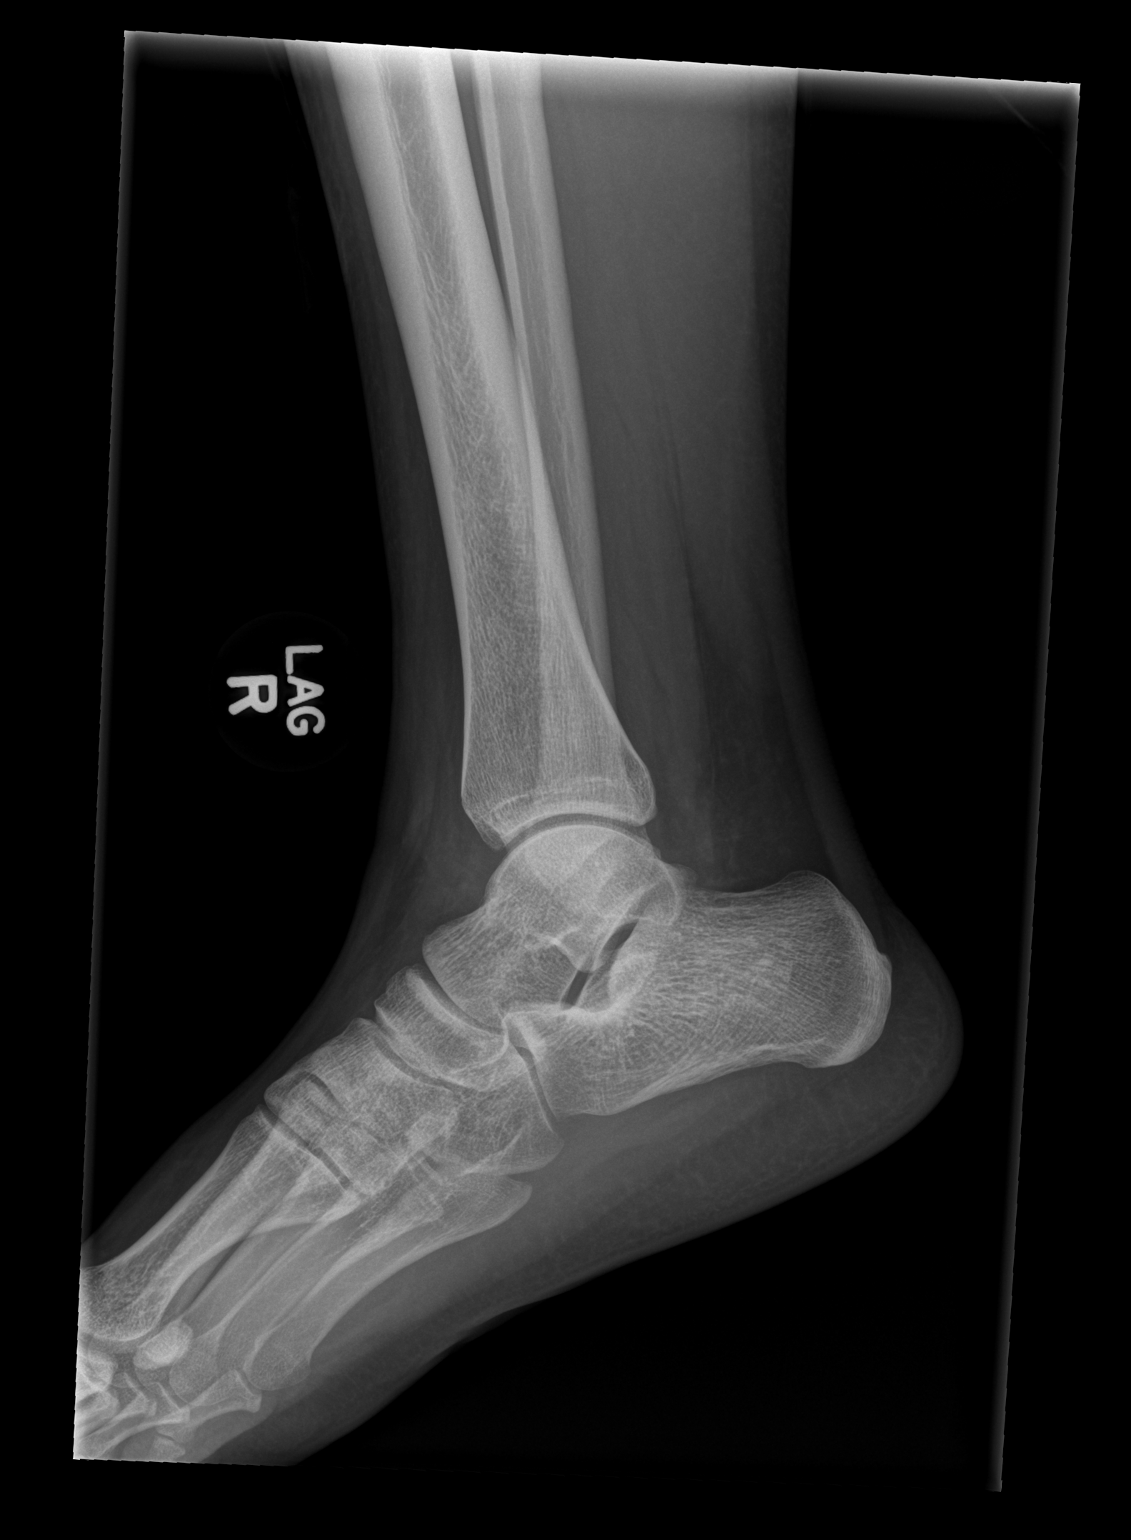

[x ankle ap right]
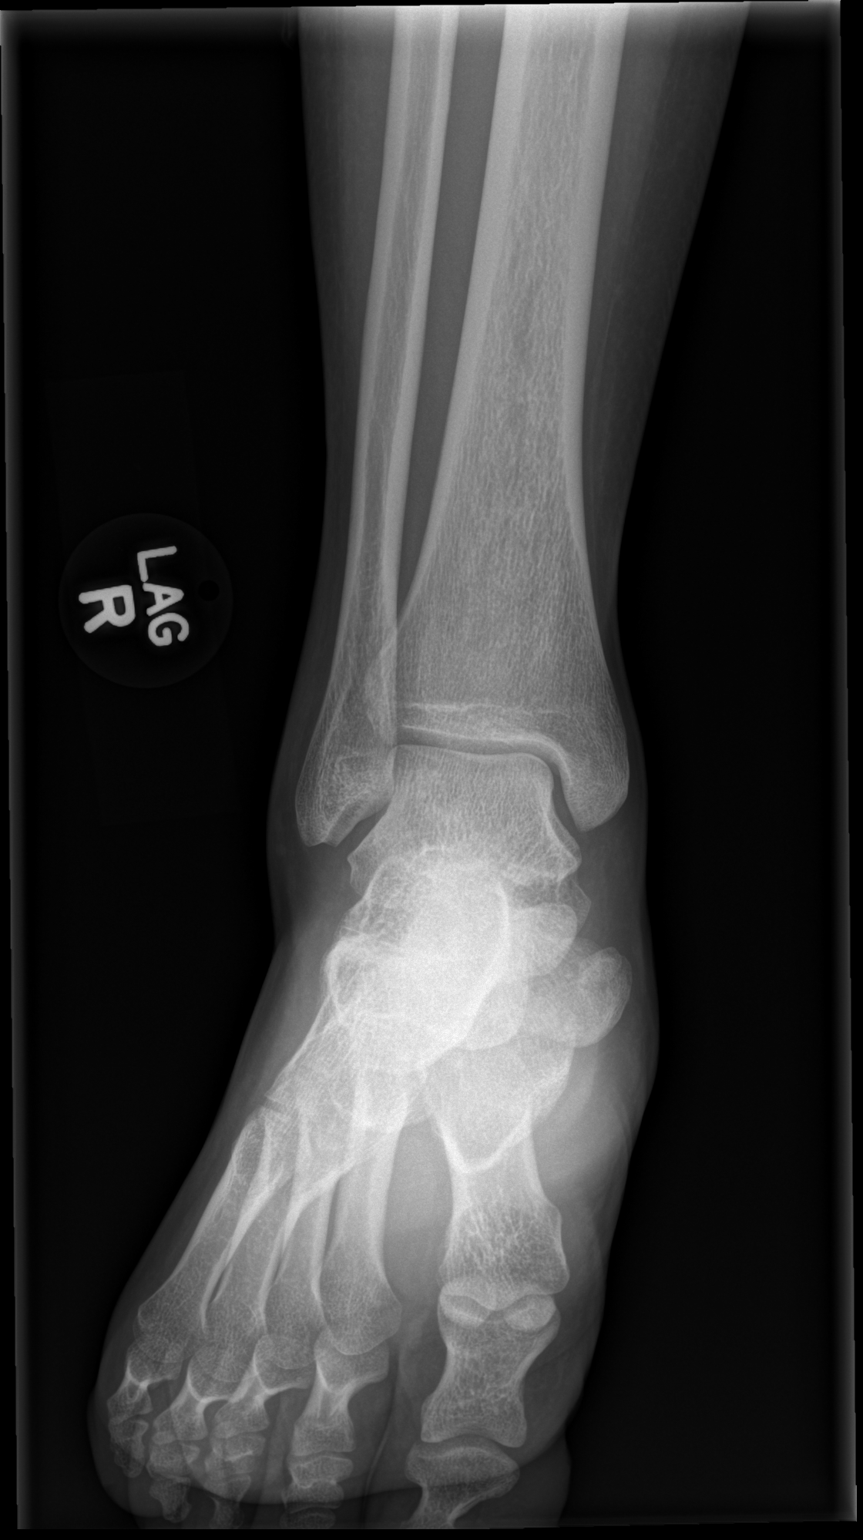

[x ankle obl right]
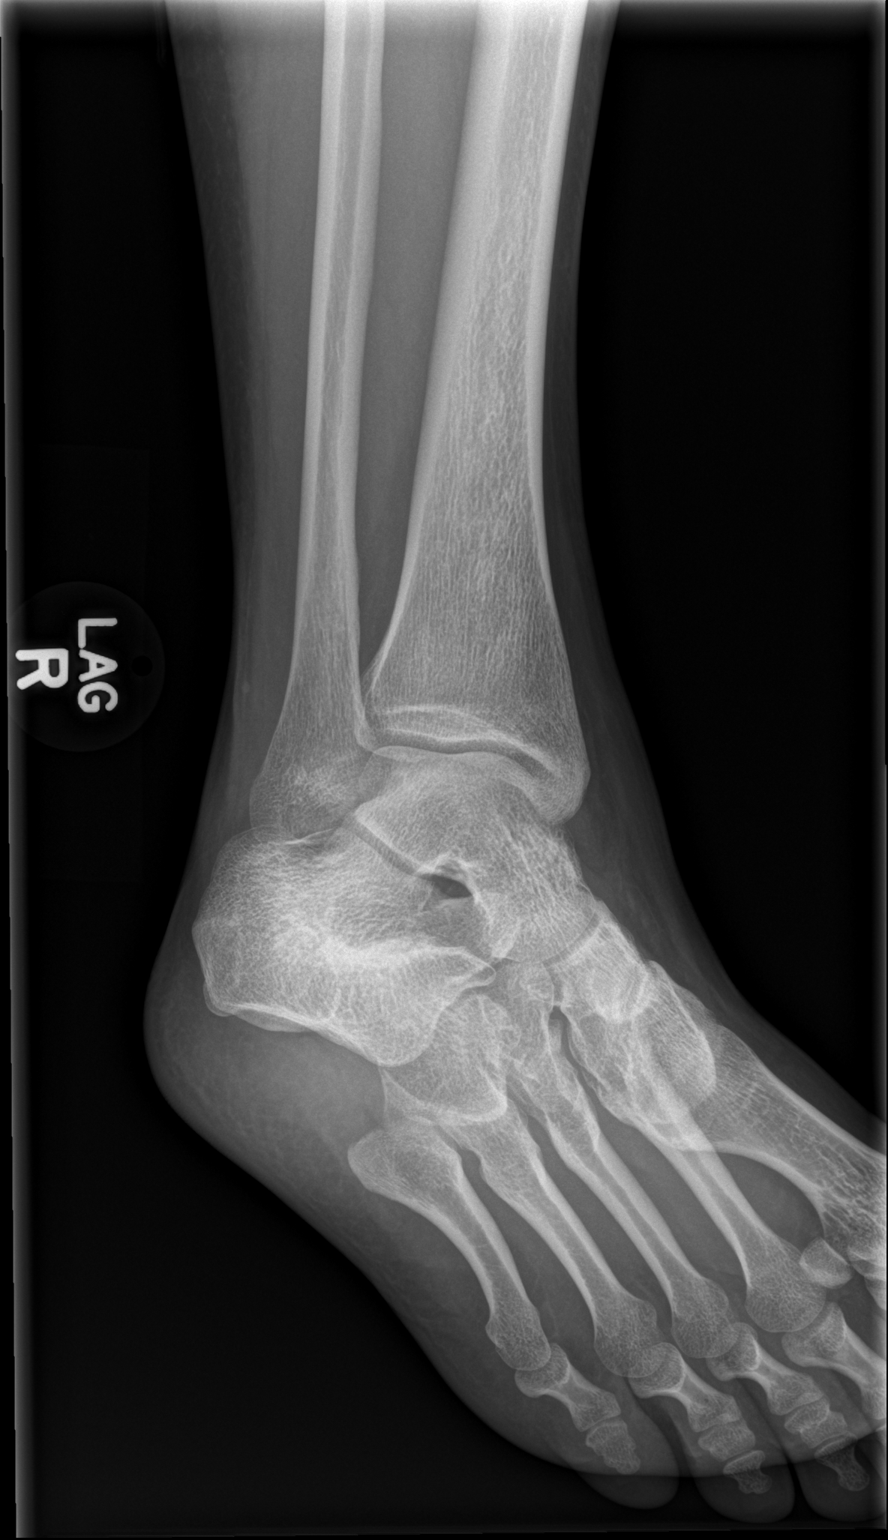

[3 of 3 positions shown; findings below may reference images not displayed]

FINDINGS: No evidence of ankle fracture or malalignment. There is slight
irregularity at the base of the fifth metatarsal, indeterminate. No
joint narrowing. Cannot exclude an ankle effusion.
IMPRESSION: 1. Negative for ankle fracture malalignment.
2. Subtle irregularity of the fifth metatarsal base. If focal
tenderness in this location, recommend dedicated foot radiography to
evaluate for avulsion fracture.

## 2014-05-31 IMAGING — CR DG FOOT COMPLETE 3+V*R*
3 series · 3 of 3 positions shown · non-contrast
Comparison: None.

CLINICAL DATA: Foot pain.  Difficult exam.

EXAM:
RIGHT FOOT COMPLETE - 3+ VIEW

[x foot lat right]
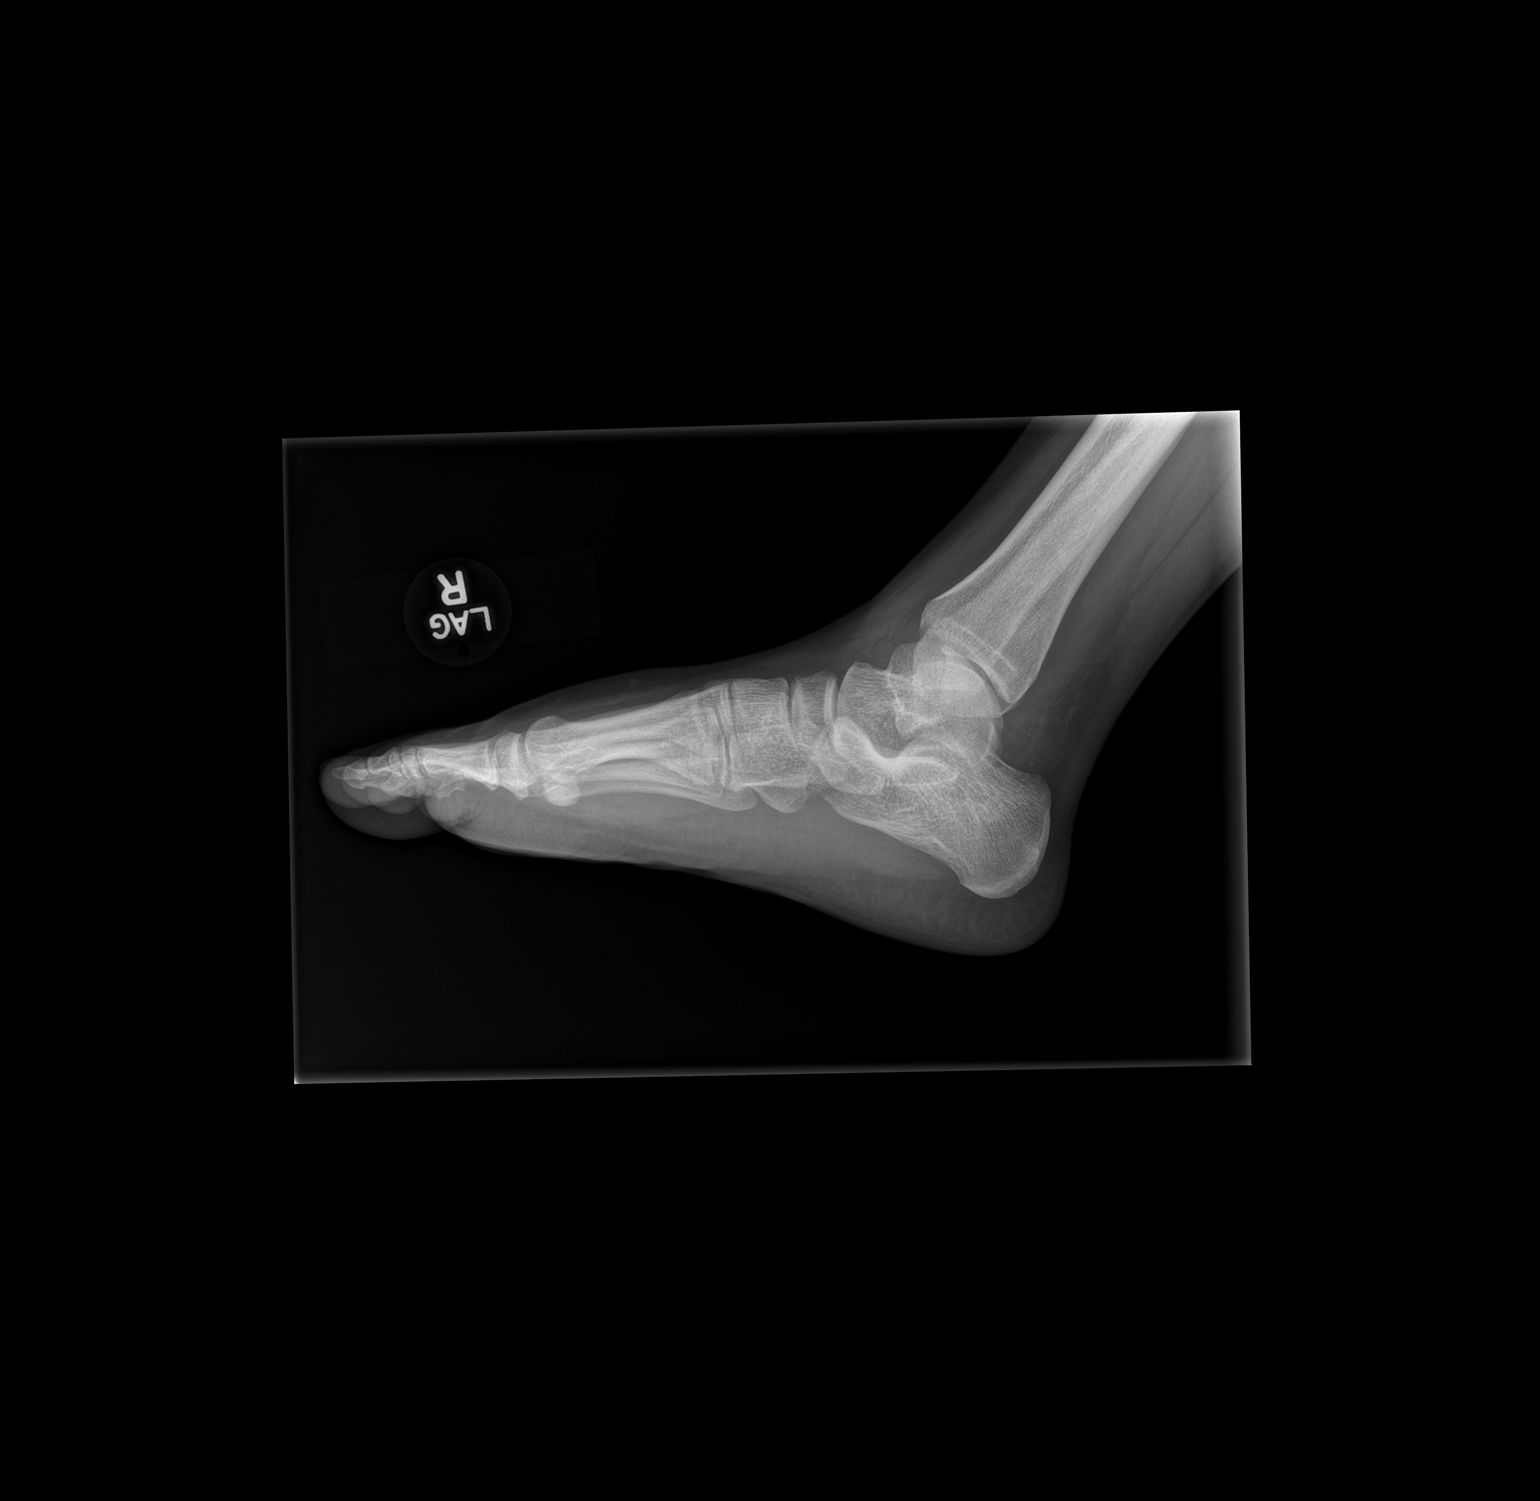

[x foot ap right]
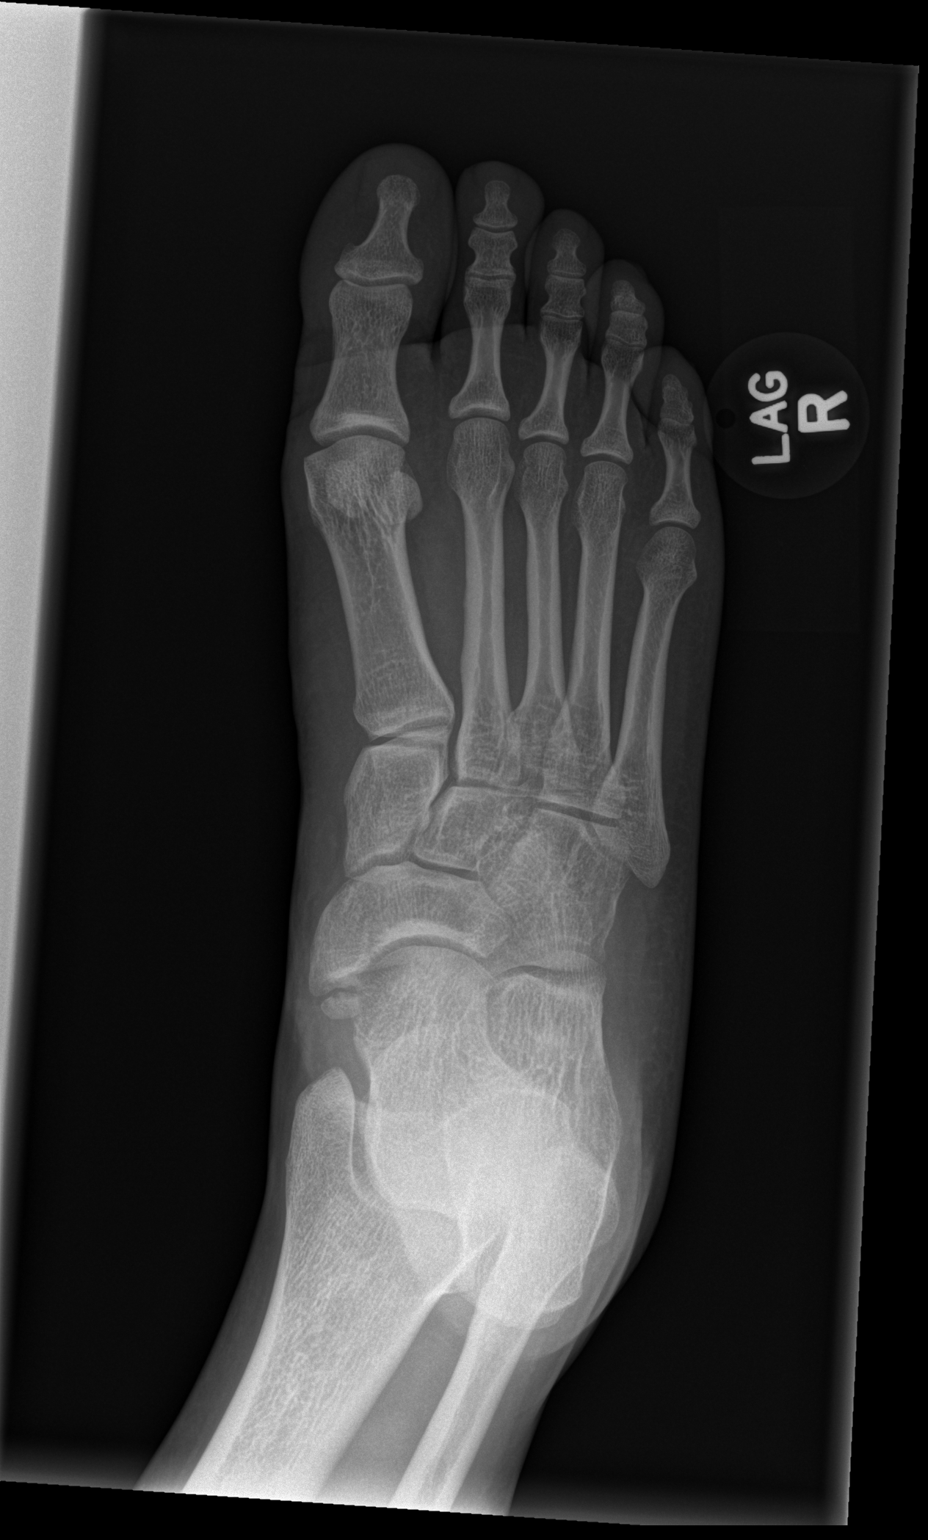

[x foot obl right]
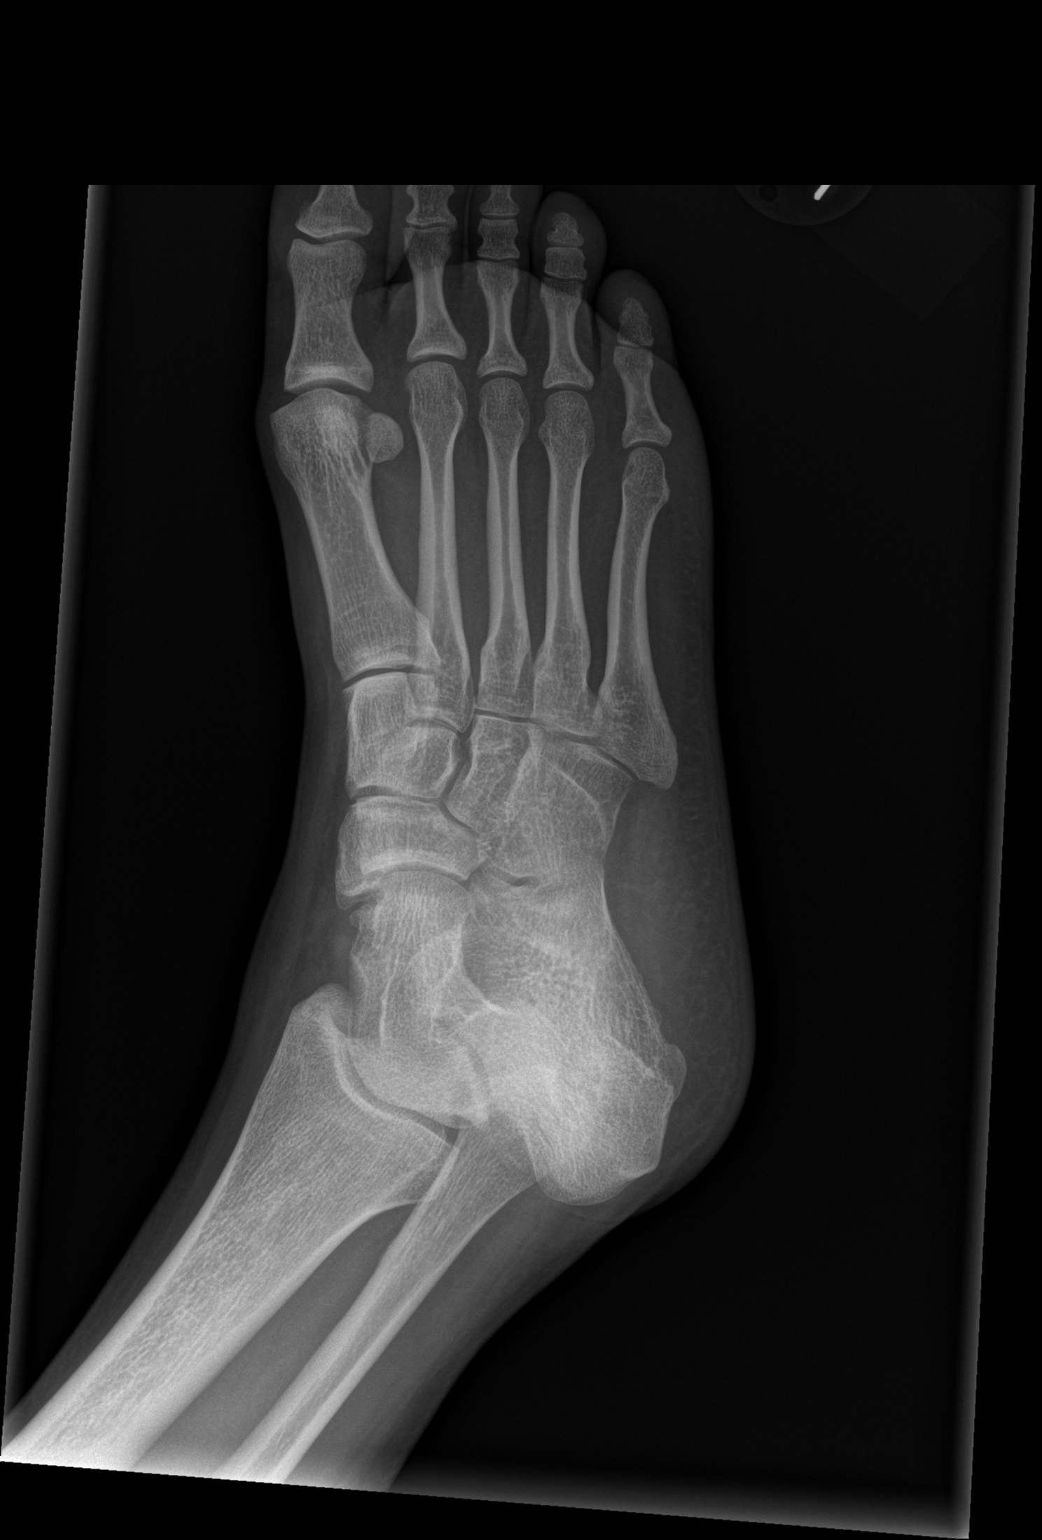

[3 of 3 positions shown; findings below may reference images not displayed]

FINDINGS: There is mild soft tissue swelling anterior to the ankle. No acute
fracture or malalignment. No significant or focal joint narrowing.
IMPRESSION: No acute osseous abnormality.

## 2014-06-01 ENCOUNTER — Encounter (HOSPITAL_COMMUNITY): Payer: Self-pay | Admitting: Emergency Medicine

## 2014-06-01 ENCOUNTER — Emergency Department (HOSPITAL_COMMUNITY): Payer: Medicare Other

## 2014-06-01 ENCOUNTER — Inpatient Hospital Stay (HOSPITAL_COMMUNITY)
Admission: AD | Admit: 2014-06-01 | Discharge: 2014-06-05 | DRG: 781 | Disposition: A | Discharge status: Home / Self Care | Payer: Medicare Other | Source: Ambulatory Visit | Attending: Internal Medicine | Admitting: Internal Medicine

## 2014-06-01 DIAGNOSIS — D5700 Hb-SS disease with crisis, unspecified: Secondary | ICD-10-CM | POA: Diagnosis not present

## 2014-06-01 DIAGNOSIS — O26892 Other specified pregnancy related conditions, second trimester: Secondary | ICD-10-CM | POA: Diagnosis not present

## 2014-06-01 DIAGNOSIS — D638 Anemia in other chronic diseases classified elsewhere: Secondary | ICD-10-CM | POA: Diagnosis present

## 2014-06-01 DIAGNOSIS — Z3A10 10 weeks gestation of pregnancy: Secondary | ICD-10-CM | POA: Diagnosis present

## 2014-06-01 DIAGNOSIS — J020 Streptococcal pharyngitis: Secondary | ICD-10-CM | POA: Diagnosis present

## 2014-06-01 DIAGNOSIS — Z87891 Personal history of nicotine dependence: Secondary | ICD-10-CM | POA: Diagnosis not present

## 2014-06-01 DIAGNOSIS — O208 Other hemorrhage in early pregnancy: Secondary | ICD-10-CM | POA: Diagnosis not present

## 2014-06-01 DIAGNOSIS — K219 Gastro-esophageal reflux disease without esophagitis: Secondary | ICD-10-CM | POA: Diagnosis present

## 2014-06-01 DIAGNOSIS — O99011 Anemia complicating pregnancy, first trimester: Principal | ICD-10-CM | POA: Diagnosis present

## 2014-06-01 DIAGNOSIS — R112 Nausea with vomiting, unspecified: Secondary | ICD-10-CM

## 2014-06-01 DIAGNOSIS — R52 Pain, unspecified: Secondary | ICD-10-CM | POA: Diagnosis not present

## 2014-06-01 DIAGNOSIS — Z3490 Encounter for supervision of normal pregnancy, unspecified, unspecified trimester: Secondary | ICD-10-CM

## 2014-06-01 DIAGNOSIS — E876 Hypokalemia: Secondary | ICD-10-CM | POA: Diagnosis not present

## 2014-06-01 DIAGNOSIS — G43909 Migraine, unspecified, not intractable, without status migrainosus: Secondary | ICD-10-CM | POA: Diagnosis present

## 2014-06-01 DIAGNOSIS — D72829 Elevated white blood cell count, unspecified: Secondary | ICD-10-CM | POA: Diagnosis not present

## 2014-06-01 LAB — COMPREHENSIVE METABOLIC PANEL
ALBUMIN: 4.2 g/dL (ref 3.5–5.2)
ALT: 15 U/L (ref 0–35)
AST: 23 U/L (ref 0–37)
Alkaline Phosphatase: 46 U/L (ref 39–117)
Anion gap: 7 (ref 5–15)
BUN: 6 mg/dL (ref 6–23)
CHLORIDE: 108 meq/L (ref 96–112)
CO2: 21 mmol/L (ref 19–32)
Calcium: 8.8 mg/dL (ref 8.4–10.5)
Creatinine, Ser: 0.43 mg/dL — ABNORMAL LOW (ref 0.50–1.10)
Glucose, Bld: 89 mg/dL (ref 70–99)
Potassium: 3.6 mmol/L (ref 3.5–5.1)
SODIUM: 136 mmol/L (ref 135–145)
Total Bilirubin: 1.7 mg/dL — ABNORMAL HIGH (ref 0.3–1.2)
Total Protein: 7.5 g/dL (ref 6.0–8.3)

## 2014-06-01 LAB — URINALYSIS, ROUTINE W REFLEX MICROSCOPIC
Bilirubin Urine: NEGATIVE
Glucose, UA: NEGATIVE mg/dL
Hgb urine dipstick: NEGATIVE
KETONES UR: NEGATIVE mg/dL
Nitrite: NEGATIVE
PROTEIN: NEGATIVE mg/dL
SPECIFIC GRAVITY, URINE: 1.016 (ref 1.005–1.030)
Urobilinogen, UA: 1 mg/dL (ref 0.0–1.0)
pH: 6 (ref 5.0–8.0)

## 2014-06-01 LAB — CBC WITH DIFFERENTIAL/PLATELET
BASOS PCT: 0 % (ref 0–1)
Basophils Absolute: 0 10*3/uL (ref 0.0–0.1)
Eosinophils Absolute: 0.1 10*3/uL (ref 0.0–0.7)
Eosinophils Relative: 1 % (ref 0–5)
HCT: 26 % — ABNORMAL LOW (ref 36.0–46.0)
Hemoglobin: 8.9 g/dL — ABNORMAL LOW (ref 12.0–15.0)
Lymphocytes Relative: 18 % (ref 12–46)
Lymphs Abs: 2.8 10*3/uL (ref 0.7–4.0)
MCH: 30.2 pg (ref 26.0–34.0)
MCHC: 34.2 g/dL (ref 30.0–36.0)
MCV: 88.1 fL (ref 78.0–100.0)
MONO ABS: 1.4 10*3/uL — AB (ref 0.1–1.0)
MONOS PCT: 10 % (ref 3–12)
NEUTROS ABS: 10.9 10*3/uL — AB (ref 1.7–7.7)
NEUTROS PCT: 71 % (ref 43–77)
Platelets: 483 10*3/uL — ABNORMAL HIGH (ref 150–400)
RBC: 2.95 MIL/uL — ABNORMAL LOW (ref 3.87–5.11)
RDW: 18 % — ABNORMAL HIGH (ref 11.5–15.5)
WBC: 15.2 10*3/uL — ABNORMAL HIGH (ref 4.0–10.5)

## 2014-06-01 LAB — ACETAMINOPHEN LEVEL: Acetaminophen (Tylenol), Serum: <10 ug/mL — ABNORMAL LOW (ref 10–30)

## 2014-06-01 LAB — LACTATE DEHYDROGENASE: LDH: 291 U/L — ABNORMAL HIGH (ref 94–250)

## 2014-06-01 LAB — RETICULOCYTES
RBC.: 2.95 MIL/uL — ABNORMAL LOW (ref 3.87–5.11)
Retic Count, Absolute: 525.1 10*3/uL — ABNORMAL HIGH (ref 19.0–186.0)
Retic Ct Pct: 17.8 % — ABNORMAL HIGH (ref 0.4–3.1)

## 2014-06-01 LAB — URINE MICROSCOPIC-ADD ON

## 2014-06-01 LAB — POC URINE PREG, ED: PREG TEST UR: POSITIVE — AB

## 2014-06-01 MED ORDER — HYDROMORPHONE HCL 2 MG/ML IJ SOLN
2.0000 mg | Freq: Once | INTRAMUSCULAR | Status: AC
  Administered 2014-06-01: 2 mg via INTRAVENOUS
  Filled 2014-06-01: qty 1

## 2014-06-01 MED ORDER — DIPHENHYDRAMINE HCL 50 MG/ML IJ SOLN
25.0000 mg | Freq: Once | INTRAMUSCULAR | Status: AC
  Administered 2014-06-01: 25 mg via INTRAVENOUS
  Filled 2014-06-01: qty 1

## 2014-06-01 MED ORDER — ONDANSETRON HCL 4 MG/2ML IJ SOLN: 4.0000 mg | Freq: Four times a day (QID) | INTRAMUSCULAR | Status: DC | PRN

## 2014-06-01 MED ORDER — HYDROMORPHONE HCL 1 MG/ML IJ SOLN
1.0000 mg | Freq: Once | INTRAMUSCULAR | Status: AC
  Administered 2014-06-01: 1 mg via INTRAVENOUS
  Filled 2014-06-01: qty 1

## 2014-06-01 MED ORDER — SODIUM CHLORIDE 0.9 % IJ SOLN: 9.0000 mL | INTRAMUSCULAR | Status: DC | PRN

## 2014-06-01 MED ORDER — SODIUM CHLORIDE 0.9 % IV BOLUS (SEPSIS)
1000.0000 mL | Freq: Once | INTRAVENOUS | Status: AC
  Administered 2014-06-01: 1000 mL via INTRAVENOUS

## 2014-06-01 MED ORDER — METOCLOPRAMIDE HCL 5 MG/ML IJ SOLN
10.0000 mg | Freq: Once | INTRAMUSCULAR | Status: AC
  Administered 2014-06-01: 10 mg via INTRAVENOUS
  Filled 2014-06-01: qty 2

## 2014-06-01 MED ORDER — HYDROMORPHONE 2 MG/ML HIGH CONCENTRATION IV PCA SOLN
INTRAVENOUS | Status: DC
  Filled 2014-06-01: qty 25

## 2014-06-01 MED ORDER — FOLIC ACID 1 MG PO TABS
1.0000 mg | ORAL_TABLET | Freq: Every day | ORAL | Status: DC
  Administered 2014-06-01 – 2014-06-02 (×2): 1 mg via ORAL
  Filled 2014-06-01 (×3): qty 1

## 2014-06-01 MED ORDER — NALOXONE HCL 0.4 MG/ML IJ SOLN: 0.4000 mg | INTRAMUSCULAR | Status: DC | PRN

## 2014-06-01 NOTE — ED Notes (Signed)
Attempted to collect blood unable to obtain,

## 2014-06-01 NOTE — ED Provider Notes (Signed)
CSN: [id number]     Arrival date & time 06/01/14  1348 History   First MD Initiated Contact with Patient 06/01/14 1504     Chief Complaint  Patient presents with  . Sickle Cell Pain Crisis  . Migraine     (Consider location/radiation/quality/duration/timing/severity/associated sxs/prior Treatment) Patient is a 24 y.o. female presenting with sickle cell pain and migraines. The history is provided by the patient.  Sickle Cell Pain Crisis Location:  Upper extremity Severity:  Moderate Onset quality:  Gradual Duration:  3 days Similar to previous crisis episodes: yes   Timing:  Constant Progression:  Unchanged Chronicity:  Recurrent History of pulmonary emboli: no   Context: cold exposure and pregnancy   Relieved by:  Prescription drugs Ineffective treatments:  Prescription drugs Associated symptoms: fatigue, headaches (3 days of gradual onset migraines) and vomiting (once in the morning today and yesterday)   Associated symptoms: no chest pain, no congestion, no cough and no fever   Migraine Associated symptoms include headaches (3 days of gradual onset migraines). Pertinent negatives include no chest pain.    Past Medical History  Diagnosis Date  . H/O: 1 miscarriage 03/22/2011    Pt reports 2 miscarriages.  [name] 01/08/2009  . Depression 01/06/2011  . GERD (gastroesophageal reflux disease) 02/17/2011  . Trichotillomania     h/o  . Blood transfusion     "lots"  . Sickle cell anemia with crisis   . Exertional dyspnea     "sometimes"  . Sickle cell anemia   . Headache(784.0)   . Migraines 11/08/11    "@ least twice/month"  . Chronic back pain     "very severe; have knot in my back; from tight muscle; take RX and exercise for it"  . Mood swings 11/08/11    "I go back and forth; real bad"  . Pregnancy   . Blood dyscrasia     SICKLE CELL   Past Surgical History  Procedure Laterality Date  . Cholecystectomy  05/2010  . Dilation and curettage of uterus   02/20/11    S/P miscarriage   Family History  Problem Relation Age of Onset  . Diabetes Mother   . Alcoholism    . Depression    . Hypercholesterolemia    . Hypertension    . Migraines    . Diabetes Maternal Grandmother   . Diabetes Paternal Grandmother    History  Substance Use Topics  . Smoking status: Former Smoker -- 0.25 packs/day for 1 years    Types: Cigarettes    Quit date: 03/25/2013  . Smokeless tobacco: Never Used  . Alcohol Use: No     Comment: pt states she quit marijuan in May 2013. Rare ETOH, + cigarettes.  She is enrolled in school   OB History    Gravida Para Term Preterm AB TAB SAB Ectopic Multiple Living   3    1  1          Obstetric Comments   Miscarried in October 2012 at about 7 weeks     Review of Systems  Constitutional: Positive for fatigue. Negative for fever.  HENT: Negative for congestion.   Respiratory: Negative for cough.   Cardiovascular: Negative for chest pain.  Gastrointestinal: Positive for vomiting (once in the morning today and yesterday).  Neurological: Positive for headaches (3 days of gradual onset migraines).  All other systems reviewed and are negative.     Allergies  Carrot oil and Latex  Home Medications   Prior  to Admission medications   Medication Sig Start Date End Date Taking? Authorizing Provider  acetaminophen (TYLENOL) 500 MG tablet Take 1,000 mg by mouth every 6 (six) hours as needed for moderate pain or headache.   Yes Historical Provider, MD  folic acid (FOLVITE) 1 MG tablet Take 2 tablets (2 mg total) by mouth daily. 04/24/14  Yes Dorena Dew, FNP  methadone (DOLOPHINE) 5 MG tablet Take 1 tablet (5 mg total) by mouth at bedtime. 05/27/14  Yes Dorena Dew, FNP  Oxycodone HCl 10 MG TABS Take 1.5 tablets (15 mg total) by mouth every 6 (six) hours as needed (moderate pain). 05/27/14 06/04/14 Yes Dorena Dew, FNP  Prenatal Vit-Fe Fumarate-FA (PRENATAL MULTIVITAMIN) TABS tablet Take 1 tablet by mouth  daily at 12 noon.   Yes Historical Provider, MD  alprazolam Duanne Moron) 2 MG tablet Take 1 tablet by mouth 2 (two) times daily as needed. anxiety 04/29/14   Historical Provider, MD   BP 117/60 mmHg  Pulse 99  Temp(Src) 99.6 F (37.6 C) (Oral)  Resp 18  SpO2 100%  LMP 03/20/2014 Physical Exam  Constitutional: She is oriented to person, place, and time. She appears well-developed and well-nourished. No distress.  HENT:  Head: Normocephalic and atraumatic.  Mouth/Throat: Oropharynx is clear and moist.  Eyes: EOM are normal. Pupils are equal, round, and reactive to light.  Neck: Normal range of motion. Neck supple.  Cardiovascular: Normal rate and regular rhythm.  Exam reveals no friction rub.   No murmur heard. Pulmonary/Chest: Effort normal and breath sounds normal. No respiratory distress. She has no wheezes. She has no rales.  Abdominal: Soft. She exhibits no distension. There is no tenderness. There is no rebound.  Musculoskeletal: She exhibits no edema.       Right elbow: She exhibits normal range of motion, no swelling and no effusion. Tenderness (mild, diffuse) found.       Left elbow: She exhibits normal range of motion, no swelling and no effusion. Tenderness (mild, diffuse) found.  Neurological: She is alert and oriented to person, place, and time.  Skin: No rash noted. She is not diaphoretic.  Nursing note and vitals reviewed.   ED Course  Procedures (including critical care time) Labs Review Labs Reviewed  CBC WITH DIFFERENTIAL - Abnormal; Notable for the following:    WBC 15.2 (*)    RBC 2.95 (*)    Hemoglobin 8.9 (*)    HCT 26.0 (*)    RDW 18.0 (*)    Platelets 483 (*)    Neutro Abs 10.9 (*)    Monocytes Absolute 1.4 (*)    All other components within normal limits  RETICULOCYTES - Abnormal; Notable for the following:    Retic Ct Pct 17.8 (*)    RBC. 2.95 (*)    Retic Count, Manual 525.1 (*)    All other components within normal limits  COMPREHENSIVE METABOLIC  PANEL  URINALYSIS, ROUTINE W REFLEX MICROSCOPIC  ACETAMINOPHEN LEVEL    Imaging Review US Ob Comp Less 14 Wks  06/01/2014   CLINICAL DATA:  Pelvic pain. Ten weeks 1 day by last menstrual period. Sickle cell.  EXAM: OBSTETRIC <14 WK [place] AND TRANSVAGINAL OB US  TECHNIQUE: Both transabdominal and transvaginal ultrasound examinations were performed for complete evaluation of the gestation as well as the maternal uterus, adnexal regions, and pelvic cul-de-sac. Transvaginal technique was performed to assess early pregnancy.  COMPARISON:  None.  FINDINGS: Intrauterine gestational sac: Visualized/normal in shape.  Yolk sac:  Visualized  Embryo:  Visualized  Cardiac Activity: Visualized  Heart Rate:  152 bpm  MSD:    mm    w     d  CRL:   34.7  mm   10 w 3 d                  [place] EDC:  Maternal uterus/adnexae: Small subchorionic hemorrhage. No adnexal masses. No free fluid.  IMPRESSION: Small subchorionic hemorrhage.  No adnexal masses.  No free fluid.   Electronically Signed   By: Rolm Baptise M.D.   On: 06/01/2014 17:25   US Ob Transvaginal  06/01/2014   CLINICAL DATA:  Pelvic pain. Ten weeks 1 day by last menstrual period. Sickle cell.  EXAM: OBSTETRIC <14 WK [place] AND TRANSVAGINAL OB US  TECHNIQUE: Both transabdominal and transvaginal ultrasound examinations were performed for complete evaluation of the gestation as well as the maternal uterus, adnexal regions, and pelvic cul-de-sac. Transvaginal technique was performed to assess early pregnancy.  COMPARISON:  None.  FINDINGS: Intrauterine gestational sac: Visualized/normal in shape.  Yolk sac:  Visualized  Embryo:  Visualized  Cardiac Activity: Visualized  Heart Rate:  152 bpm  MSD:    mm    w     d  CRL:   34.7  mm   10 w 3 d                  [place] EDC:  Maternal uterus/adnexae: Small subchorionic hemorrhage. No adnexal masses. No free fluid.  IMPRESSION: Small subchorionic hemorrhage.  No adnexal masses.  No free fluid.   Electronically Signed   By: Rolm Baptise M.D.    On: 06/01/2014 17:25     EKG Interpretation None       Angiocath insertion Performed by: Osvaldo Shipper  Consent: Verbal consent obtained. Risks and benefits: risks, benefits and alternatives were discussed Time out: Immediately prior to procedure a "time out" was called to verify the correct patient, procedure, equipment, support staff and site/side marked as required.  Preparation: Patient was prepped and draped in the usual sterile fashion.  Vein Location: R AC  Yes Ultrasound Guided  Gauge: 20  Normal blood return and flush without difficulty Patient tolerance: Patient tolerated the procedure well with no immediate complications.     MDM   Final diagnoses:  Pregnant  Sickle cell pain crisis    24 year old female with history of sickle cell anemia presents with pain crisis. Gradual onset over the past 3 days, in the elbows. Normally has upper extremity joint pain with her pain crisis of this is similar to prior. She also having a migraine. Gradual onset for 3 days, mild relief with Tylenol but not improving. She states she's taken a whole bottle of Tylenol over the past 2 days. Mild relief with oxycodone. She has been weaning off the oxycodone is also roughly [redacted] weeks pregnant. She this is her second pregnancy, her first pregnancy ended in a miscarriage 8 weeks. She has not yet seen OB/GYN. Here she has bilateral elbow pain with decreased range of motion but no effusion, swelling. Mild elbow tenderness diffusely. Neuro exam is normal. Will not CT her head because she is pregnant. Will give migraine cocktail. We'll also give her IV narcotics for her pain crisis.  Admitted to [facility] service.   Evelina Bucy, MD 06/02/14 [phone]

## 2014-06-01 NOTE — ED Notes (Signed)
Patient transported to Ultrasound 

## 2014-06-01 NOTE — ED Notes (Signed)
Pt c/o sickle cell pain in bilat elbows that started today.  Pt also has migraine that has been going on for couple days. Pt states that she called [name] office on Thursday and since pt is pregnant, they recommended her take tylenol for the pain but not helping.

## 2014-06-01 NOTE — Consult Note (Signed)
[facility] Initial Consult Note  [name]  WCH:[id number]  DOB: [birth year]  DOA: 06/01/2014 DOS: the patient was seen and examined on 06/01/2014   Referring physician: Dr. Mingo Amber PCP: MATTHEWS,MICHELLE A., MD   Reason for consult: Sickle cell crisis  HPI: [name] is a 24 y.o. female with Past medical history of hemoglobin SS disease, depression, GERD, 2 spontaneous abortions The patient is presenting with complaints of bilateral elbow pain. Patient was recently hospitalized with a streptococcal infection for which she was treated with antibiotics. She also had sickle cell crisis. Patient is [redacted] weeks pregnant and has not been able to see OB/GYN as of yet. She was switched from methadone to oxycodone a few days ago. Patient is also taking prenatal vitamins. She is not taking any other medications. Today she called her sickle cell doctor who recommended her to take Tylenol. Despite taking all medications her pain was not getting better and therefore she presented to the ER. She had an episode of vomiting along with episodes of nausea throughout the day. She denies any diarrhea or burning urination cough fever chills chest pain abdominal pain. Last admission she had back pain which was her typical sickle cell pain but this time she does not have any back pain.  Review of Systems: as mentioned in the history of present illness.  A Comprehensive review of the other systems is negative.  Past Medical History  Diagnosis Date  . H/O: 1 miscarriage 03/22/2011    Pt reports 2 miscarriages.  [name] 01/08/2009  . Depression 01/06/2011  . GERD (gastroesophageal reflux disease) 02/17/2011  . Trichotillomania     h/o  . Blood transfusion     "lots"  . Sickle cell anemia with crisis   . Exertional dyspnea     "sometimes"  . Sickle cell anemia   . Headache(784.0)   . Migraines 11/08/11    "@ least twice/month"  . Chronic back pain     "very severe; have  knot in my back; from tight muscle; take RX and exercise for it"  . Mood swings 11/08/11    "I go back and forth; real bad"  . Pregnancy   . Blood dyscrasia     SICKLE CELL   Past Surgical History  Procedure Laterality Date  . Cholecystectomy  05/2010  . Dilation and curettage of uterus  02/20/11    S/P miscarriage   Social History:  reports that she quit smoking about 14 months ago. Her smoking use included Cigarettes. She has a .25 pack-year smoking history. She has never used smokeless tobacco. She reports that she does not drink alcohol or use illicit drugs. Patient is coming from home.  Allergies  Allergen Reactions  . Carrot Oil Hives and Swelling  . Latex Rash    Family History  Problem Relation Age of Onset  . Diabetes Mother   . Alcoholism    . Depression    . Hypercholesterolemia    . Hypertension    . Migraines    . Diabetes Maternal Grandmother   . Diabetes Paternal Grandmother     Prior to Admission medications   Medication Sig Start Date End Date Taking? Authorizing Provider  acetaminophen (TYLENOL) 500 MG tablet Take 1,000 mg by mouth every 6 (six) hours as needed for moderate pain or headache.   Yes Historical Provider, MD  folic acid (FOLVITE) 1 MG tablet Take 2 tablets (2 mg total) by mouth daily. 04/24/14  Yes Dorena Dew, FNP  methadone (DOLOPHINE) 5 MG tablet Take 1 tablet (5 mg total) by mouth at bedtime. 05/27/14  Yes Dorena Dew, FNP  Oxycodone HCl 10 MG TABS Take 1.5 tablets (15 mg total) by mouth every 6 (six) hours as needed (moderate pain). 05/27/14 06/04/14 Yes Dorena Dew, FNP  Prenatal Vit-Fe Fumarate-FA (PRENATAL MULTIVITAMIN) TABS tablet Take 1 tablet by mouth daily at 12 noon.   Yes Historical Provider, MD  alprazolam Duanne Moron) 2 MG tablet Take 1 tablet by mouth 2 (two) times daily as needed. anxiety 04/29/14   Historical Provider, MD    Physical Exam: Filed Vitals:   06/01/14 1411 06/01/14 1755 06/01/14 2029  BP: 117/60 90/54  93/56  Pulse: 99 89 80  Temp: 99.6 F (37.6 C)  98.1 F (36.7 C)  TempSrc: Oral  Oral  Resp: 18 14 16   SpO2: 100% 97% 96%    General: Alert, Awake and Oriented to Time, Place and Person. Appear in mild distress Eyes: PERRL ENT: Oral Mucosa clear dry. Neck: No JVD,   Cardiovascular: S1 and S2 Present, no Murmur, Peripheral Pulses Present Respiratory: Bilateral Air entry equal and Decreased, Clear to Auscultation,  No Crackles, no wheezes Abdomen: Bowel Sound Present, Soft and Non tender Skin: No Rash Extremities: No Pedal edema, no calf tenderness Neurologic: Grossly Unremarkable.  Labs:  Basic Metabolic Panel:  Recent Labs Lab 06/01/14 1500  NA 136  K 3.6  CL 108  CO2 21  GLUCOSE 89  BUN 6  CREATININE 0.43*  CALCIUM 8.8   Liver Function Tests:  Recent Labs Lab 06/01/14 1500  AST 23  ALT 15  ALKPHOS 46  BILITOT 1.7*  PROT 7.5  ALBUMIN 4.2   No results for input(s): LIPASE, AMYLASE in the last 168 hours. No results for input(s): AMMONIA in the last 168 hours. CBC:  Recent Labs Lab 06/01/14 1500  WBC 15.2*  NEUTROABS 10.9*  HGB 8.9*  HCT 26.0*  MCV 88.1  PLT 483*   Cardiac Enzymes: No results for input(s): CKTOTAL, CKMB, CKMBINDEX, TROPONINI in the last 168 hours.  BNP (last 3 results) No results for input(s): PROBNP in the last 8760 hours. CBG: No results for input(s): GLUCAP in the last 168 hours.  Radiological Exams: US Ob Comp Less 14 Wks  06/01/2014   CLINICAL DATA:  Pelvic pain. Ten weeks 1 day by last menstrual period. Sickle cell.  EXAM: OBSTETRIC <14 WK [place] AND TRANSVAGINAL OB US  TECHNIQUE: Both transabdominal and transvaginal ultrasound examinations were performed for complete evaluation of the gestation as well as the maternal uterus, adnexal regions, and pelvic cul-de-sac. Transvaginal technique was performed to assess early pregnancy.  COMPARISON:  None.  FINDINGS: Intrauterine gestational sac: Visualized/normal in shape.  Yolk sac:   Visualized  Embryo:  Visualized  Cardiac Activity: Visualized  Heart Rate:  152 bpm  MSD:    mm    w     d  CRL:   34.7  mm   10 w 3 d                  [place] EDC:  Maternal uterus/adnexae: Small subchorionic hemorrhage. No adnexal masses. No free fluid.  IMPRESSION: Small subchorionic hemorrhage.  No adnexal masses.  No free fluid.   Electronically Signed   By: Rolm Baptise M.D.   On: 06/01/2014 17:25   US Ob Transvaginal  06/01/2014   CLINICAL DATA:  Pelvic pain. Ten weeks 1 day by last menstrual period. Sickle cell.  EXAM:  OBSTETRIC <14 WK [place] AND TRANSVAGINAL OB US  TECHNIQUE: Both transabdominal and transvaginal ultrasound examinations were performed for complete evaluation of the gestation as well as the maternal uterus, adnexal regions, and pelvic cul-de-sac. Transvaginal technique was performed to assess early pregnancy.  COMPARISON:  None.  FINDINGS: Intrauterine gestational sac: Visualized/normal in shape.  Yolk sac:  Visualized  Embryo:  Visualized  Cardiac Activity: Visualized  Heart Rate:  152 bpm  MSD:    mm    w     d  CRL:   34.7  mm   10 w 3 d                  [place] EDC:  Maternal uterus/adnexae: Small subchorionic hemorrhage. No adnexal masses. No free fluid.  IMPRESSION: Small subchorionic hemorrhage.  No adnexal masses.  No free fluid.   Electronically Signed   By: Rolm Baptise M.D.   On: 06/01/2014 17:25   Assessment/Plan Principal Problem:   Sickle cell pain crisis Active Problems:   Nausea & vomiting   Streptococcal sore throat  Active Problems:   Nausea & vomiting   Streptococcal sore throat   1. Sickle cell pain crisis. The patient is presenting with sickle cell pain crisis. She has mildly elevated bilirubin, elevated reticulocyte count as well as leukocytosis. Her hemoglobin at present is stable 8.9. During her last admission goal was to transfuse if her hemoglobin is less than 5.6 or she is symptomatic. At this point patient will be admitted at [facility] due to her  ongoing pregnancy with subchorionic hemorrhage. Appreciate OB/GYN input. Patient will be started on D5 half normal saline, and her usual Dilaudid PCA dose that has been used to treat her crisis in the past admissions. Continue Foley acid. Sickle cell team will continue to follow the patient.  2. 10 weeks pregnancy with subchorionic hemorrhage. Patient will be transferred [facility] and will be admitted under OB/GYN. Management for OB/GYN. Monitor medications for pregnancy category. Recommendation for sickle cell crisis during pregnancy is opioids and hydration.  3. Recent streptococcal sore throat. Patient was recently treated with amoxicillin and deepot bicillin.  No ongoing symptoms.   Thank you very much for involving [place] in care of your patient.  We will continue to follow the patient.   Author: Berle Mull, MD [facility] Pager: [phone] 06/01/2014 9:28 PM    If 7PM-7AM, please contact night-coverage [email or web address] Password [email or web address]

## 2014-06-02 ENCOUNTER — Telehealth: Payer: Self-pay | Admitting: Internal Medicine

## 2014-06-02 ENCOUNTER — Encounter (HOSPITAL_COMMUNITY): Payer: Self-pay | Admitting: *Deleted

## 2014-06-02 DIAGNOSIS — D571 Sickle-cell disease without crisis: Secondary | ICD-10-CM

## 2014-06-02 DIAGNOSIS — G8929 Other chronic pain: Secondary | ICD-10-CM

## 2014-06-02 DIAGNOSIS — D5700 Hb-SS disease with crisis, unspecified: Secondary | ICD-10-CM

## 2014-06-02 LAB — CBC WITH DIFFERENTIAL/PLATELET
Basophils Absolute: 0.1 10*3/uL (ref 0.0–0.1)
Basophils Relative: 0 % (ref 0–1)
EOS PCT: 2 % (ref 0–5)
Eosinophils Absolute: 0.2 10*3/uL (ref 0.0–0.7)
HEMATOCRIT: 22.8 % — AB (ref 36.0–46.0)
Hemoglobin: 8 g/dL — ABNORMAL LOW (ref 12.0–15.0)
LYMPHS PCT: 20 % (ref 12–46)
Lymphs Abs: 3.1 10*3/uL (ref 0.7–4.0)
MCH: 30.8 pg (ref 26.0–34.0)
MCHC: 35.1 g/dL (ref 30.0–36.0)
MCV: 87.7 fL (ref 78.0–100.0)
MONO ABS: 1.6 10*3/uL — AB (ref 0.1–1.0)
Monocytes Relative: 10 % (ref 3–12)
NEUTROS ABS: 10.4 10*3/uL — AB (ref 1.7–7.7)
Neutrophils Relative %: 67 % (ref 43–77)
PLATELETS: 370 10*3/uL (ref 150–400)
RBC: 2.6 MIL/uL — AB (ref 3.87–5.11)
RDW: 17.7 % — ABNORMAL HIGH (ref 11.5–15.5)
WBC: 15.4 10*3/uL — ABNORMAL HIGH (ref 4.0–10.5)

## 2014-06-02 LAB — COMPREHENSIVE METABOLIC PANEL
ALBUMIN: 3.4 g/dL — AB (ref 3.5–5.2)
ALT: 13 U/L (ref 0–35)
AST: 19 U/L (ref 0–37)
Alkaline Phosphatase: 38 U/L — ABNORMAL LOW (ref 39–117)
Anion gap: 4 — ABNORMAL LOW (ref 5–15)
BILIRUBIN TOTAL: 1.8 mg/dL — AB (ref 0.3–1.2)
BUN: 6 mg/dL (ref 6–23)
CO2: 23 mmol/L (ref 19–32)
CREATININE: 0.36 mg/dL — AB (ref 0.50–1.10)
Calcium: 8.4 mg/dL (ref 8.4–10.5)
Chloride: 108 mEq/L (ref 96–112)
GFR calc Af Amer: >90 mL/min (ref >90)
GFR calc non Af Amer: >90 mL/min (ref >90)
Glucose, Bld: 91 mg/dL (ref 70–99)
POTASSIUM: 3.6 mmol/L (ref 3.5–5.1)
SODIUM: 135 mmol/L (ref 135–145)
Total Protein: 6.4 g/dL (ref 6.0–8.3)

## 2014-06-02 LAB — RETICULOCYTES
RBC.: 2.6 MIL/uL — ABNORMAL LOW (ref 3.87–5.11)
RETIC COUNT ABSOLUTE: 423.8 10*3/uL — AB (ref 19.0–186.0)
Retic Ct Pct: 16.3 % — ABNORMAL HIGH (ref 0.4–3.1)

## 2014-06-02 MED ORDER — ACETAMINOPHEN 650 MG RE SUPP: 650.0000 mg | Freq: Four times a day (QID) | RECTAL | Status: DC | PRN

## 2014-06-02 MED ORDER — DIPHENHYDRAMINE HCL 12.5 MG/5ML PO ELIX: 12.5000 mg | ORAL_SOLUTION | Freq: Four times a day (QID) | ORAL | Status: DC | PRN

## 2014-06-02 MED ORDER — SODIUM CHLORIDE 0.9 % IJ SOLN: 9.0000 mL | INTRAMUSCULAR | Status: DC | PRN

## 2014-06-02 MED ORDER — OXYCODONE HCL 10 MG PO TABS: 15.0000 mg | ORAL_TABLET | Freq: Four times a day (QID) | ORAL | Status: DC | PRN

## 2014-06-02 MED ORDER — LACTATED RINGERS IV BOLUS (SEPSIS)
1000.0000 mL | Freq: Once | INTRAVENOUS | Status: AC
  Administered 2014-06-02: 1000 mL via INTRAVENOUS

## 2014-06-02 MED ORDER — DEXTROSE-NACL 5-0.45 % IV SOLN
INTRAVENOUS | Status: DC
  Administered 2014-06-02: 19:00:00 via INTRAVENOUS
  Administered 2014-06-03: 1000 mL via INTRAVENOUS
  Administered 2014-06-03 – 2014-06-04 (×3): via INTRAVENOUS

## 2014-06-02 MED ORDER — SENNA 8.6 MG PO TABS
1.0000 | ORAL_TABLET | Freq: Two times a day (BID) | ORAL | Status: DC
  Administered 2014-06-02 (×2): 8.6 mg via ORAL
  Filled 2014-06-02 (×5): qty 1

## 2014-06-02 MED ORDER — HYDROMORPHONE 2 MG/ML HIGH CONCENTRATION IV PCA SOLN
INTRAVENOUS | Status: DC
  Administered 2014-06-02: 20:00:00 via INTRAVENOUS
  Administered 2014-06-03: 1.8 mg via INTRAVENOUS
  Administered 2014-06-03: 13.8 mg via INTRAVENOUS

## 2014-06-02 MED ORDER — ONDANSETRON HCL 4 MG/2ML IJ SOLN: 4.0000 mg | Freq: Four times a day (QID) | INTRAMUSCULAR | Status: DC | PRN

## 2014-06-02 MED ORDER — KETOROLAC TROMETHAMINE 30 MG/ML IJ SOLN
30.0000 mg | Freq: Three times a day (TID) | INTRAMUSCULAR | Status: DC
  Administered 2014-06-02 (×2): 30 mg via INTRAVENOUS
  Filled 2014-06-02: qty 2
  Filled 2014-06-02: qty 1
  Filled 2014-06-02 (×2): qty 2

## 2014-06-02 MED ORDER — ONDANSETRON HCL 4 MG PO TABS: 4.0000 mg | ORAL_TABLET | Freq: Four times a day (QID) | ORAL | Status: DC | PRN

## 2014-06-02 MED ORDER — FOLIC ACID 1 MG PO TABS
2.0000 mg | ORAL_TABLET | Freq: Every day | ORAL | Status: DC
  Administered 2014-06-03 – 2014-06-05 (×3): 2 mg via ORAL
  Filled 2014-06-02 (×4): qty 2

## 2014-06-02 MED ORDER — DIPHENHYDRAMINE HCL 50 MG/ML IJ SOLN: 12.5000 mg | Freq: Four times a day (QID) | INTRAMUSCULAR | Status: DC | PRN

## 2014-06-02 MED ORDER — NALOXONE HCL 0.4 MG/ML IJ SOLN: 0.4000 mg | INTRAMUSCULAR | Status: DC | PRN

## 2014-06-02 MED ORDER — PRENATAL MULTIVITAMIN CH
1.0000 | ORAL_TABLET | Freq: Every day | ORAL | Status: DC
  Administered 2014-06-02 – 2014-06-05 (×4): 1 via ORAL
  Filled 2014-06-02 (×5): qty 1

## 2014-06-02 MED ORDER — ACETAMINOPHEN 325 MG PO TABS: 650.0000 mg | ORAL_TABLET | Freq: Four times a day (QID) | ORAL | Status: DC | PRN

## 2014-06-02 MED ORDER — SODIUM CHLORIDE 0.9 % IV SOLN
INTRAVENOUS | Status: DC
  Administered 2014-06-02: 10:00:00 via INTRAVENOUS

## 2014-06-02 MED ORDER — HYDROMORPHONE HCL 1 MG/ML IJ SOLN
1.0000 mg | INTRAMUSCULAR | Status: DC | PRN
  Administered 2014-06-02 (×2): 1 mg via INTRAVENOUS
  Administered 2014-06-02: 2 mg via INTRAVENOUS
  Administered 2014-06-02: 1 mg via INTRAVENOUS
  Administered 2014-06-02: 2 mg via INTRAVENOUS
  Administered 2014-06-02: 1 mg via INTRAVENOUS
  Administered 2014-06-02: 2 mg via INTRAVENOUS
  Administered 2014-06-02: 1 mg via INTRAVENOUS
  Filled 2014-06-02 (×2): qty 1
  Filled 2014-06-02 (×2): qty 2
  Filled 2014-06-02: qty 1
  Filled 2014-06-02: qty 2
  Filled 2014-06-02 (×2): qty 1

## 2014-06-02 NOTE — Telephone Encounter (Signed)
Meds ordered this encounter  Medications  . Oxycodone HCl 10 MG TABS    Sig: Take 1.5 tablets (15 mg total) by mouth every 6 (six) hours as needed (moderate pain).    Dispense:  42 tablet    Refill:  0    Order Specific Question:  Supervising Provider    Answer:  Liston Alba A [[id number]]  Patient to continue to come in weekly for medication management. Reviewed [place] Substance Reporting system prior to reorder  Dorena Dew, FNP

## 2014-06-02 NOTE — Progress Notes (Signed)
Patient ID: [name], female   DOB: 1990/08/07, 24 y.o.   MRN: [id number]  Patient stable from obstetrical standpoint.  Will transfer patient to [facility] under Sickle Cell Team.    Loma [place] JEHIEL 6:00 PM 06/02/2014

## 2014-06-02 NOTE — Progress Notes (Signed)
Patient ID: [name], female   DOB: 03/08/1991, 24 y.o.   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, usually joints She currently denies chest pain  Last crisis 3 weeks ago with assoicated tonsillitis, Group A strep  Results for orders placed or performed during the hospital encounter of 06/01/14 (from the past 24 hour(s))  CBC with Differential     Status: Abnormal   Collection Time: 06/01/14  3:00 PM  Result Value Ref Range   WBC 15.2 (H) 4.0 - 10.5 K/uL   RBC 2.95 (L) 3.87 - 5.11 MIL/uL   Hemoglobin 8.9 (L) 12.0 - 15.0 g/dL   HCT 26.0 (L) 36.0 - 46.0 %   MCV 88.1 78.0 - 100.0 fL   MCH 30.2 26.0 - 34.0 pg   MCHC 34.2 30.0 - 36.0 g/dL   RDW 18.0 (H) 11.5 - 15.5 %   Platelets 483 (H) 150 - 400 K/uL   Neutrophils Relative % 71 43 - 77 %   Neutro Abs 10.9 (H) 1.7 - 7.7 K/uL   Lymphocytes Relative 18 12 - 46 %   Lymphs Abs 2.8 0.7 - 4.0 K/uL   Monocytes Relative 10 3 - 12 %   Monocytes Absolute 1.4 (H) 0.1 - 1.0 K/uL   Eosinophils Relative 1 0 - 5 %   Eosinophils Absolute 0.1 0.0 - 0.7 K/uL   Basophils Relative 0 0 - 1 %   Basophils Absolute 0.0 0.0 - 0.1 K/uL  Comprehensive metabolic panel     Status: Abnormal   Collection Time: 06/01/14  3:00 PM  Result Value Ref Range   Sodium 136 135 - 145 mmol/L   Potassium 3.6 3.5 - 5.1 mmol/L   Chloride 108 96 - 112 mEq/L   CO2 21 19 - 32 mmol/L   Glucose, Bld 89 70 - 99 mg/dL   BUN 6 6 - 23 mg/dL   Creatinine, Ser 0.43 (L) 0.50 - 1.10 mg/dL   Calcium 8.8 8.4 - 10.5 mg/dL   Total Protein 7.5 6.0 - 8.3 g/dL   Albumin 4.2 3.5 - 5.2 g/dL   AST 23 0 - 37 U/L   ALT 15 0 - 35 U/L   Alkaline Phosphatase 46 39 - 117 U/L   Total Bilirubin 1.7 (H) 0.3 - 1.2 mg/dL   GFR calc non Af Amer >90 >90 mL/min   GFR calc Af Amer >90 >90 mL/min   Anion gap 7 5 - 15  Reticulocytes     Status: Abnormal   Collection Time: 06/01/14  3:00 PM  Result Value Ref Range   Retic Ct Pct 17.8 (H) 0.4 - 3.1 %   RBC. 2.95 (L) 3.87 - 5.11 MIL/uL   Retic Count, Manual 525.1 (H) 19.0 - 186.0 K/uL  Lactate dehydrogenase     Status: Abnormal   Collection Time: 06/01/14  3:00 PM  Result Value Ref Range   LDH 291 (H) 94 - 250 U/L  Acetaminophen level     Status: Abnormal   Collection Time: 06/01/14  3:18 PM  Result Value Ref Range   Acetaminophen (Tylenol), Serum <10.0 (L) 10 - 30 ug/mL  Urinalysis, Routine w reflex microscopic     Status: Abnormal   Collection Time: 06/01/14  3:39 PM  Result Value Ref Range   Color, Urine YELLOW YELLOW   APPearance CLOUDY (A) CLEAR   Specific Gravity, Urine  1.016 1.005 - 1.030   pH 6.0 5.0 - 8.0   Glucose, UA NEGATIVE NEGATIVE mg/dL   Hgb urine dipstick NEGATIVE NEGATIVE   Bilirubin Urine NEGATIVE NEGATIVE   Ketones, ur NEGATIVE NEGATIVE mg/dL   Protein, ur NEGATIVE NEGATIVE mg/dL   Urobilinogen, UA 1.0 0.0 - 1.0 mg/dL   Nitrite NEGATIVE NEGATIVE   Leukocytes, UA SMALL (A) NEGATIVE  Urine microscopic-add on     Status: None   Collection Time: 06/01/14  3:39 PM  Result Value Ref Range   Squamous Epithelial / LPF RARE RARE   WBC, UA 3-6 <3 WBC/hpf  POC urine preg, ED (not at [facility])     Status: Abnormal   Collection Time: 06/01/14  3:55 PM  Result Value Ref Range   Preg Test, Ur POSITIVE (A) NEGATIVE  Comprehensive metabolic panel     Status: Abnormal   Collection Time: 06/02/14  5:30 AM  Result Value Ref Range   Sodium 135 135 - 145 mmol/L   Potassium 3.6 3.5 - 5.1 mmol/L   Chloride 108 96 - 112 mEq/L   CO2 23 19 - 32 mmol/L   Glucose, Bld 91 70 - 99 mg/dL   BUN 6 6 - 23 mg/dL   Creatinine, Ser 0.36 (L) 0.50 - 1.10 mg/dL   Calcium 8.4 8.4 - 10.5 mg/dL   Total Protein 6.4 6.0 - 8.3 g/dL   Albumin 3.4 (L) 3.5 - 5.2 g/dL   AST 19 0 - 37 U/L   ALT 13 0 - 35 U/L   Alkaline Phosphatase 38 (L) 39 - 117 U/L   Total Bilirubin 1.8 (H) 0.3 - 1.2 mg/dL   GFR calc  non Af Amer >90 >90 mL/min   GFR calc Af Amer >90 >90 mL/min   Anion gap 4 (L) 5 - 15  CBC with Differential     Status: Abnormal   Collection Time: 06/02/14  5:30 AM  Result Value Ref Range   WBC 15.4 (H) 4.0 - 10.5 K/uL   RBC 2.60 (L) 3.87 - 5.11 MIL/uL   Hemoglobin 8.0 (L) 12.0 - 15.0 g/dL   HCT 22.8 (L) 36.0 - 46.0 %   MCV 87.7 78.0 - 100.0 fL   MCH 30.8 26.0 - 34.0 pg   MCHC 35.1 30.0 - 36.0 g/dL   RDW 17.7 (H) 11.5 - 15.5 %   Platelets 370 150 - 400 K/uL   Neutrophils Relative % 67 43 - 77 %   Neutro Abs 10.4 (H) 1.7 - 7.7 K/uL   Lymphocytes Relative 20 12 - 46 %   Lymphs Abs 3.1 0.7 - 4.0 K/uL   Monocytes Relative 10 3 - 12 %   Monocytes Absolute 1.6 (H) 0.1 - 1.0 K/uL   Eosinophils Relative 2 0 - 5 %   Eosinophils Absolute 0.2 0.0 - 0.7 K/uL   Basophils Relative 0 0 - 1 %   Basophils Absolute 0.1 0.0 - 0.1 K/uL  Reticulocytes     Status: Abnormal   Collection Time: 06/02/14  5:30 AM  Result Value Ref Range   Retic Ct Pct 16.3 (H) 0.4 - 3.1 %   RBC. 2.60 (L) 3.87 - 5.11 MIL/uL   Retic Count, Manual 423.8 (H) 19.0 - 186.0 K/uL   Results for DENNIS, KILLILEA (MRN [id number]) as of 06/02/2014 07:46  Ref. Range 07/16/2013 04:05 07/30/2013 22:20 08/15/2013 14:24 09/23/2013 22:50 10/28/2013 12:51 11/25/2013 09:30 01/13/2014 13:45 01/28/2014 23:16 02/16/2014 07:59 02/23/2014 07:29 04/09/2014 04:17 04/29/2014 15:39 05/02/2014 07:00 05/03/2014  05:15 05/05/2014 05:57 05/06/2014 06:40 06/01/2014 15:00 06/02/2014 05:30  Retic Ct Pct Latest Range: 0.4-3.1 % 19.0 (H) 15.0 (H) 16.7 (H) 19.1 (H) 10.7 (H) 10.6 (H) 16.2 (H) 14.6 (H) 13.2 (H) >23.0 (H) 13.6 (H) 5.0 (H) 7.1 (H) 7.9 (H) 5.9 (H) 5.9 (H) 17.8 (H) 16.3 (H)    Small sub chorionic hemorrhage but with growing viable pregnancy   US Ob Comp Less 14 Wks  06/01/2014   CLINICAL DATA:  Pelvic pain. Ten weeks 1 day by last menstrual period. Sickle cell.  EXAM: OBSTETRIC <14 WK [place] AND TRANSVAGINAL OB US  TECHNIQUE: Both transabdominal and  transvaginal ultrasound examinations were performed for complete evaluation of the gestation as well as the maternal uterus, adnexal regions, and pelvic cul-de-sac. Transvaginal technique was performed to assess early pregnancy.  COMPARISON:  None.  FINDINGS: Intrauterine gestational sac: Visualized/normal in shape.  Yolk sac:  Visualized  Embryo:  Visualized  Cardiac Activity: Visualized  Heart Rate:  152 bpm  MSD:    mm    w     d  CRL:   34.7  mm   10 w 3 d                  [place] EDC:  Maternal uterus/adnexae: Small subchorionic hemorrhage. No adnexal masses. No free fluid.  IMPRESSION: Small subchorionic hemorrhage.  No adnexal masses.  No free fluid.   Electronically Signed   By: Rolm Baptise M.D.   On: 06/01/2014 17:25   US Ob Transvaginal  06/01/2014   CLINICAL DATA:  Pelvic pain. Ten weeks 1 day by last menstrual period. Sickle cell.  EXAM: OBSTETRIC <14 WK [place] AND TRANSVAGINAL OB US  TECHNIQUE: Both transabdominal and transvaginal ultrasound examinations were performed for complete evaluation of the gestation as well as the maternal uterus, adnexal regions, and pelvic cul-de-sac. Transvaginal technique was performed to assess early pregnancy.  COMPARISON:  None.  FINDINGS: Intrauterine gestational sac: Visualized/normal in shape.  Yolk sac:  Visualized  Embryo:  Visualized  Cardiac Activity: Visualized  Heart Rate:  152 bpm  MSD:    mm    w     d  CRL:   34.7  mm   10 w 3 d                  [place] EDC:  Maternal uterus/adnexae: Small subchorionic hemorrhage. No adnexal masses. No free fluid.  IMPRESSION: Small subchorionic hemorrhage.  No adnexal masses.  No free fluid.   Electronically Signed   By: Rolm Baptise M.D.   On: 06/01/2014 17:25    Current facility-administered medications:  .  0.9 %  sodium chloride infusion, , Intravenous, Continuous, Elberta Leatherwood, MD .  acetaminophen (TYLENOL) tablet 650 mg, 650 mg, Oral, Q6H PRN **OR** acetaminophen (TYLENOL) suppository 650 mg, 650 mg, Rectal, Q6H PRN, Elberta Leatherwood, MD .  folic acid (FOLVITE) tablet 1 mg, 1 mg, Oral, Daily, Berle Mull, MD, 1 mg at 06/01/14 2143 .  HYDROmorphone (DILAUDID) injection 1-2 mg, 1-2 mg, Intravenous, Q2H PRN, Elberta Leatherwood, MD, 2 mg at 06/02/14 0536 .  ketorolac (TORADOL) 30 MG/ML injection 30 mg, 30 mg, Intravenous, 3 times per day, Elberta Leatherwood, MD, 30 mg at 06/02/14 0219 .  lactated ringers bolus 1,000 mL, 1,000 mL, Intravenous, Once, Florian Buff, MD .  ondansetron [facility]) tablet 4 mg, 4 mg, Oral, Q6H PRN **OR** ondansetron (ZOFRAN) injection 4 mg, 4 mg, Intravenous, Q6H PRN, Loa Socks  Cletus Gash, MD .  prenatal multivitamin tablet 1 tablet, 1 tablet, Oral, Q1200, Elberta Leatherwood, MD .  senna [facility]) tablet 8.6 mg, 1 tablet, Oral, BID, Elberta Leatherwood, MD, 8.6 mg at 06/02/14 0221     Current Status:  Pain is Better than on admission, has had 3 doses of dilaudid since admission  retic count quite high as compared to previous admission but 02/23/2014 was >23%, but no evdidence of hemolysis Her crises seem to be worse of course when the temperature drops   Imp: [redacted]w[redacted]d Estimated Date of Delivery: 12/27/14  [facility]  Plan: Will give another fluid bolus Encouraged pt to keep her oxygen on(pt had refused) Toradol for 24 hours Continue prn meds

## 2014-06-02 NOTE — Telephone Encounter (Signed)
Refill request for oxycodone 10mg . LOV 04/29/2014. Please advise. Thanks!

## 2014-06-02 NOTE — Progress Notes (Signed)
See transfer H&P

## 2014-06-02 NOTE — H&P (Signed)
Chief Complaint:  Sickle Cell Pain Crisis and Migraine   HPI: [name] is a 24 y.o. G3P0010 at [redacted]w[redacted]d who was transferred from [facility]. Patient has a hx significant for SCD, and opiate addiction. Patient states she has been experiencing significant pain in her joints; primarily her Rt elbow. She states that this pain is similar to the pain she experiences during typical sickle cell pain crises. She rates this crisis as a 6/10 compared to previous pain crises. Reports BL Hgb ~9. Denies SOB, n/v/d/c, CP, vision changes, dizziness. Patient states she has had a substantial HA x3days. She took tylenol for this at home w/o relief.   Patient self-reported having a hx of substance abuse, opiates. She admitted having a difficult time balancing these medications while also having pain crises. When asked about her typical treatment for pain crises she reported that "Dr. Zigmund Daniel told me to tell anyone admitting me that I should get a PCA".   Patient already had blood labs drawn at [facility]; no need to repeat at this time.  Denies contractions, leakage of fluid or vaginal bleeding. No PNC yet but has appointment w/ the [facility] on 06/10/14.  Of Note: Patient was able to fall asleep prior to any pain medication administration.    Pregnancy Course: n/a; 1st prenatal appt @1 /26/15  Past Medical History: Past Medical History  Diagnosis Date  . H/O: 1 miscarriage 03/22/2011    Pt reports 2 miscarriages.  [name] 01/08/2009  . Depression 01/06/2011  . GERD (gastroesophageal reflux disease) 02/17/2011  . Trichotillomania     h/o  . Blood transfusion     "lots"  . Sickle cell anemia with crisis   . Exertional dyspnea     "sometimes"  . Sickle cell anemia   . Headache(784.0)   . Migraines 11/08/11    "@ least twice/month"  . Chronic back pain     "very severe; have knot in my back; from tight muscle; take RX and exercise for it"  . Mood swings 11/08/11    "I go back and forth; real  bad"  . Pregnancy   . Blood dyscrasia     SICKLE CELL    Past obstetric history: OB History  Gravida Para Term Preterm AB SAB TAB Ectopic Multiple Living  3    1 1         # Outcome Date GA Lbr Len/2nd Weight Sex Delivery Anes PTL Lv  3 Current           2 SAB 02/20/11             Comments: System Generated. Please review and update pregnancy details.  1 Gravida              Comments: System Generated. Please review and update pregnancy details.    Obstetric Comments  Miscarried in October 2012 at about 7 weeks    Past Surgical History: Past Surgical History  Procedure Laterality Date  . Cholecystectomy  05/2010  . Dilation and curettage of uterus  02/20/11    S/P miscarriage     Family History: Family History  Problem Relation Age of Onset  . Diabetes Mother   . Alcoholism    . Depression    . Hypercholesterolemia    . Hypertension    . Migraines    . Diabetes Maternal Grandmother   . Diabetes Paternal Grandmother     Social History: History  Substance Use Topics  . Smoking status: Former Smoker --  0.25 packs/day for 1 years    Types: Cigarettes    Quit date: 03/25/2013  . Smokeless tobacco: Never Used  . Alcohol Use: No     Comment: pt states she quit marijuan in May 2013. Rare ETOH, + cigarettes.  She is enrolled in school    Allergies:  Allergies  Allergen Reactions  . Carrot Oil Hives and Swelling  . Latex Rash    Meds:  Prescriptions prior to admission  Medication Sig Dispense Refill Last Dose  . acetaminophen (TYLENOL) 500 MG tablet Take 1,000 mg by mouth every 6 (six) hours as needed for moderate pain or headache.   06/01/2014 at Unknown time  . folic acid (FOLVITE) 1 MG tablet Take 2 tablets (2 mg total) by mouth daily. 60 tablet 2 06/01/2014 at Unknown time  . methadone (DOLOPHINE) 5 MG tablet Take 1 tablet (5 mg total) by mouth at bedtime. 30 tablet 0 05/31/2014 at Unknown time  . Oxycodone HCl 10 MG TABS Take 1.5 tablets (15 mg total) by mouth  every 6 (six) hours as needed (moderate pain). 42 tablet 0 06/01/2014 at 0800  . Prenatal Vit-Fe Fumarate-FA (PRENATAL MULTIVITAMIN) TABS tablet Take 1 tablet by mouth daily at 12 noon.   05/31/2014 at Unknown time  . alprazolam (XANAX) 2 MG tablet Take 1 tablet by mouth 2 (two) times daily as needed. anxiety  0     ROS: Pertinent findings in history of present illness.  Physical Exam  Blood pressure 106/65, pulse 83, temperature 98.3 F (36.8 C), temperature source Oral, resp. rate 18, height 5\' 2"  (1.575 m), weight 73.936 kg (163 lb), last menstrual period 03/20/2014, SpO2 100 %. General -- oriented x3, pleasant and cooperative. NAD HEENT -- Head is normocephalic. PERRLA. EOMI. Sclera nonicteric  Integument -- intact. No rash, erythema, or ecchymoses.  Chest -- good expansion. Lungs clear to auscultation. Cardiac -- RRR. No murmurs noted.  Abdomen -- soft, nontender. No masses palpable. Bowel sounds present. CNS -- cranial nerves II through XII grossly intact. Extremeties - TTP over elbows and shoulders bilaterally (R>L). No effusions noted. ROM good. 5/5 bilateral strength. Dorsalis pedis and posterior tibialis pulses present and symmetrical.   Labs: Results for orders placed or performed during the hospital encounter of 06/01/14 (from the past 24 hour(s))  CBC with Differential     Status: Abnormal   Collection Time: 06/01/14  3:00 PM  Result Value Ref Range   WBC 15.2 (H) 4.0 - 10.5 K/uL   RBC 2.95 (L) 3.87 - 5.11 MIL/uL   Hemoglobin 8.9 (L) 12.0 - 15.0 g/dL   HCT 26.0 (L) 36.0 - 46.0 %   MCV 88.1 78.0 - 100.0 fL   MCH 30.2 26.0 - 34.0 pg   MCHC 34.2 30.0 - 36.0 g/dL   RDW 18.0 (H) 11.5 - 15.5 %   Platelets 483 (H) 150 - 400 K/uL   Neutrophils Relative % 71 43 - 77 %   Neutro Abs 10.9 (H) 1.7 - 7.7 K/uL   Lymphocytes Relative 18 12 - 46 %   Lymphs Abs 2.8 0.7 - 4.0 K/uL   Monocytes Relative 10 3 - 12 %   Monocytes Absolute 1.4 (H) 0.1 - 1.0 K/uL   Eosinophils Relative 1 0 -  5 %   Eosinophils Absolute 0.1 0.0 - 0.7 K/uL   Basophils Relative 0 0 - 1 %   Basophils Absolute 0.0 0.0 - 0.1 K/uL  Comprehensive metabolic panel     Status: Abnormal  Collection Time: 06/01/14  3:00 PM  Result Value Ref Range   Sodium 136 135 - 145 mmol/L   Potassium 3.6 3.5 - 5.1 mmol/L   Chloride 108 96 - 112 mEq/L   CO2 21 19 - 32 mmol/L   Glucose, Bld 89 70 - 99 mg/dL   BUN 6 6 - 23 mg/dL   Creatinine, Ser 0.43 (L) 0.50 - 1.10 mg/dL   Calcium 8.8 8.4 - 10.5 mg/dL   Total Protein 7.5 6.0 - 8.3 g/dL   Albumin 4.2 3.5 - 5.2 g/dL   AST 23 0 - 37 U/L   ALT 15 0 - 35 U/L   Alkaline Phosphatase 46 39 - 117 U/L   Total Bilirubin 1.7 (H) 0.3 - 1.2 mg/dL   GFR calc non Af Amer >90 >90 mL/min   GFR calc Af Amer >90 >90 mL/min   Anion gap 7 5 - 15  Reticulocytes     Status: Abnormal   Collection Time: 06/01/14  3:00 PM  Result Value Ref Range   Retic Ct Pct 17.8 (H) 0.4 - 3.1 %   RBC. 2.95 (L) 3.87 - 5.11 MIL/uL   Retic Count, Manual 525.1 (H) 19.0 - 186.0 K/uL  Lactate dehydrogenase     Status: Abnormal   Collection Time: 06/01/14  3:00 PM  Result Value Ref Range   LDH 291 (H) 94 - 250 U/L  Acetaminophen level     Status: Abnormal   Collection Time: 06/01/14  3:18 PM  Result Value Ref Range   Acetaminophen (Tylenol), Serum <10.0 (L) 10 - 30 ug/mL  Urinalysis, Routine w reflex microscopic     Status: Abnormal   Collection Time: 06/01/14  3:39 PM  Result Value Ref Range   Color, Urine YELLOW YELLOW   APPearance CLOUDY (A) CLEAR   Specific Gravity, Urine 1.016 1.005 - 1.030   pH 6.0 5.0 - 8.0   Glucose, UA NEGATIVE NEGATIVE mg/dL   Hgb urine dipstick NEGATIVE NEGATIVE   Bilirubin Urine NEGATIVE NEGATIVE   Ketones, ur NEGATIVE NEGATIVE mg/dL   Protein, ur NEGATIVE NEGATIVE mg/dL   Urobilinogen, UA 1.0 0.0 - 1.0 mg/dL   Nitrite NEGATIVE NEGATIVE   Leukocytes, UA SMALL (A) NEGATIVE  Urine microscopic-add on     Status: None   Collection Time: 06/01/14  3:39 PM  Result  Value Ref Range   Squamous Epithelial / LPF RARE RARE   WBC, UA 3-6 <3 WBC/hpf  POC urine preg, ED (not at [facility])     Status: Abnormal   Collection Time: 06/01/14  3:55 PM  Result Value Ref Range   Preg Test, Ur POSITIVE (A) NEGATIVE    Imaging:  US Ob Comp Less 14 Wks  06/01/2014   CLINICAL DATA:  Pelvic pain. Ten weeks 1 day by last menstrual period. Sickle cell.  EXAM: OBSTETRIC <14 WK [place] AND TRANSVAGINAL OB US  TECHNIQUE: Both transabdominal and transvaginal ultrasound examinations were performed for complete evaluation of the gestation as well as the maternal uterus, adnexal regions, and pelvic cul-de-sac. Transvaginal technique was performed to assess early pregnancy.  COMPARISON:  None.  FINDINGS: Intrauterine gestational sac: Visualized/normal in shape.  Yolk sac:  Visualized  Embryo:  Visualized  Cardiac Activity: Visualized  Heart Rate:  152 bpm  MSD:    mm    w     d  CRL:   34.7  mm   10 w 3 d  [place] EDC:  Maternal uterus/adnexae: Small subchorionic hemorrhage. No adnexal masses. No free fluid.  IMPRESSION: Small subchorionic hemorrhage.  No adnexal masses.  No free fluid.   Electronically Signed   By: Rolm Baptise M.D.   On: 06/01/2014 17:25   US Ob Transvaginal  06/01/2014   CLINICAL DATA:  Pelvic pain. Ten weeks 1 day by last menstrual period. Sickle cell.  EXAM: OBSTETRIC <14 WK [place] AND TRANSVAGINAL OB US  TECHNIQUE: Both transabdominal and transvaginal ultrasound examinations were performed for complete evaluation of the gestation as well as the maternal uterus, adnexal regions, and pelvic cul-de-sac. Transvaginal technique was performed to assess early pregnancy.  COMPARISON:  None.  FINDINGS: Intrauterine gestational sac: Visualized/normal in shape.  Yolk sac:  Visualized  Embryo:  Visualized  Cardiac Activity: Visualized  Heart Rate:  152 bpm  MSD:    mm    w     d  CRL:   34.7  mm   10 w 3 d                  [place] EDC:  Maternal uterus/adnexae: Small subchorionic hemorrhage.  No adnexal masses. No free fluid.  IMPRESSION: Small subchorionic hemorrhage.  No adnexal masses.  No free fluid.   Electronically Signed   By: Rolm Baptise M.D.   On: 06/01/2014 17:25   Ir Fluoro Guide Cv Line Left  05/03/2014   CLINICAL DATA:  24 year old with sickle cell crisis. Patient needs IV antibiotics.  EXAM: PLACEMENT OF PICC LINE WITH ULTRASOUND AND FLUOROSCOPIC GUIDANCE  FLUOROSCOPY TIME:  12 seconds  TECHNIQUE: The procedure was explained to the patient. The risks and benefits of the procedure were discussed and the patient's questions were addressed. Informed consent was obtained from the patient. In particular, the risks of fluoroscopy and pregnancy were discussed with the patient. The patient's pelvis was shielded with a lead apron for this procedure.  The right arm was prepped with chlorhexidine, draped in the usual sterile fashion using maximum barrier technique (cap and mask, sterile gown, sterile gloves, large sterile sheet, hand hygiene and cutaneous antiseptic). Local anesthesia was attained by infiltration with 1% lidocaine.  Ultrasound demonstrated patency of a right brachia vein. Under real-time ultrasound guidance, this vein was accessed with a 21 gauge micropuncture needle. Wire would not advance centrally.  Attention was directed to the left arm. Ultrasound confirmed a patent left brachial vein. The left arm was prepped and draped in sterile fashion. Maximal barrier sterile technique was utilized including caps, mask, sterile gowns, sterile gloves, sterile drape, hand hygiene and skin antiseptic. 1% lidocaine was used for local anesthetic. 21 gauge needle directed into a left brachial vein with ultrasound guidance. Wire was advanced centrally. A peel-away sheath was placed. A dual lumen 5 [place] Power PICC line was cut to 37 cm. Catheter was advanced into the central venous system with fluoroscopy. Tip placed at the superior cavoatrial junction. Both lumens aspirated and flushed well.  Catheter was secured to the skin.  COMPLICATIONS: None.  The patient tolerated the procedure well.  IMPRESSION: Successful placement of a left arm PICC with sonographic and fluoroscopic guidance. The catheter is ready for use.  Please note that a PICC line could not be successfully placed from the right arm.   Electronically Signed   By: Markus Daft M.D.   On: 05/03/2014 18:51   Ir US Guide Vasc Access Left  05/03/2014   CLINICAL DATA:  24 year old with sickle cell crisis. Patient needs IV  antibiotics.  EXAM: PLACEMENT OF PICC LINE WITH ULTRASOUND AND FLUOROSCOPIC GUIDANCE  FLUOROSCOPY TIME:  12 seconds  TECHNIQUE: The procedure was explained to the patient. The risks and benefits of the procedure were discussed and the patient's questions were addressed. Informed consent was obtained from the patient. In particular, the risks of fluoroscopy and pregnancy were discussed with the patient. The patient's pelvis was shielded with a lead apron for this procedure.  The right arm was prepped with chlorhexidine, draped in the usual sterile fashion using maximum barrier technique (cap and mask, sterile gown, sterile gloves, large sterile sheet, hand hygiene and cutaneous antiseptic). Local anesthesia was attained by infiltration with 1% lidocaine.  Ultrasound demonstrated patency of a right brachia vein. Under real-time ultrasound guidance, this vein was accessed with a 21 gauge micropuncture needle. Wire would not advance centrally.  Attention was directed to the left arm. Ultrasound confirmed a patent left brachial vein. The left arm was prepped and draped in sterile fashion. Maximal barrier sterile technique was utilized including caps, mask, sterile gowns, sterile gloves, sterile drape, hand hygiene and skin antiseptic. 1% lidocaine was used for local anesthetic. 21 gauge needle directed into a left brachial vein with ultrasound guidance. Wire was advanced centrally. A peel-away sheath was placed. A dual lumen 5  [place] Power PICC line was cut to 37 cm. Catheter was advanced into the central venous system with fluoroscopy. Tip placed at the superior cavoatrial junction. Both lumens aspirated and flushed well. Catheter was secured to the skin.  COMPLICATIONS: None.  The patient tolerated the procedure well.  IMPRESSION: Successful placement of a left arm PICC with sonographic and fluoroscopic guidance. The catheter is ready for use.  Please note that a PICC line could not be successfully placed from the right arm.   Electronically Signed   By: Markus Daft M.D.   On: 05/03/2014 18:51   Assessment and Plan: 1: Sickle Cell Pain Crisis; BL Hgb ~9. Currently 8.9. LDH and Retic acutely elevated above BL. Likely acute pain crises w/o acute chest. Patient reports pain is in presentation of typical pain crisis.  - admit to inpatient med-surg - IVF NS @125ml /hr - Supplemental O2 and monitor O2 Sats - Toradol 30mg  IV Q8h - Dilaudid 1-2mg  IV Q2h PRN  - Regular Diet - folic acid tab, QD - Repeat CBC w/ Dif, CMP, and Retics in AM - Low threshold for antibiotics if patient begins to show signs of fever or SOB  2: Pregnancy; [redacted]w[redacted]d - Strict short-term NSAID use (cleared by Dr. Elonda Husky) - Limit opiod use - Close f/u at [facility]  3. Leukocytosis; currently 15.2. Patient also has thrombocytosis @483 . All is likely due to acute process. - Continue to monitor - Low threshold for antibiotics if patient begins to show signs of fever or SOB    Medication List    ASK your doctor about these medications        acetaminophen 500 MG tablet  Commonly known as:  TYLENOL  Take 1,000 mg by mouth every 6 (six) hours as needed for moderate pain or headache.     alprazolam 2 MG tablet  Commonly known as:  XANAX  Take 1 tablet by mouth 2 (two) times daily as needed. anxiety     folic acid 1 MG tablet  Commonly known as:  FOLVITE  Take 2 tablets (2 mg total) by mouth daily.     methadone 5 MG tablet  Commonly known as:  DOLOPHINE   Take 1 tablet (  5 mg total) by mouth at bedtime.     Oxycodone HCl 10 MG Tabs  Take 1.5 tablets (15 mg total) by mouth every 6 (six) hours as needed (moderate pain).     prenatal multivitamin Tabs tablet  Take 1 tablet by mouth daily at 12 noon.        Elberta Leatherwood, MD 06/02/2014 1:04 AM

## 2014-06-02 NOTE — Consult Note (Signed)
SICKLE CELL SERVICE CONSULT NOTE  DOSHA BROSHEARS QJF:[id number] DOB: [birth year] DOA: 06/01/2014 PCP: [name]., MD  Consulting Service: OB/Gyn Consulting Physician: Dr. Evelina Bucy  Presenting HPI: [name] is a 24 y.o. G3P0010 at [redacted]w[redacted]d who was transferred from [facility]. Patient has a hx significant for SCD, and opiate addiction. Patient states she has been experiencing significant pain in her joints; primarily her Rt elbow. She states that this pain is similar to the pain she experiences during typical sickle cell pain crises. She rates this crisis as a 6/10 compared to previous pain crises. Reports BL Hgb ~9. Denies SOB, n/v/d/c, CP, vision changes, dizziness. Patient states she has had a substantial HA x3days. She took tylenol for this at home w/o relief.   Patient self-reported having a hx of substance abuse, opiates. She admitted having a difficult time balancing these medications while also having pain crises. When asked about her typical treatment for pain crises she reported that "Dr. Zigmund Daniel told me to tell anyone admitting me that I should get a PCA".   Patient already had blood labs drawn at [facility]; no need to repeat at this time.  Denies contractions, leakage of fluid or vaginal bleeding. No PNC yet but has appointment w/ the [facility] on 06/10/14.  Of Note: Patient was able to fall asleep prior to any pain medication administration.   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 Pt states that her pain began 2 days ago in her right arm and shoulder. She currently rates the pain as a 7/10. She is getting Dilaudid IV, which helps but it makes her sleepy. She was admitted to the [facility] hospital due to concern of a subchorionic hemorrhage found on ultrasound. She is eating and drinking well. Her last bowel movement was today.    Objective: Filed Vitals:   06/02/14 0010 06/02/14 0200 06/02/14 0539 06/02/14 1222  BP:   104/61 101/62  Pulse:   79 78  Temp:   98.8 F (37.1 C) 98.7 F (37.1 C)  TempSrc:   Oral Oral  Resp:   18 18  Height:      Weight:      SpO2: 100% 100% 100% 100%   Weight change:   Intake/Output Summary (Last 24 hours) at 06/02/14 1709 Last data filed at 06/01/14 1823  Gross per 24 hour  Intake    240 ml  Output      0 ml  Net    240 ml    General: Alert, awake, oriented x3, in no acute distress.  HEENT: [place]/AT PEERL, EOMI Neck: Trachea midline,  no masses OROPHARYNX:  Moist, No exudate/ erythema/lesions.  Heart: Regular rate and rhythm, without murmurs, rubs, gallops Lungs: Clear to auscultation, no wheezing or rhonchi noted. Abdomen: Soft, nontender, nondistended, positive bowel sounds, gravid Neuro: No focal neurological deficits noted cranial nerves II through XII grossly intact. Strength 5 out of 5 in bilateral upper and lower extremities. Musculoskeletal: No warm swelling or erythema around joints, no spinal tenderness noted. Psychiatric: Patient alert and oriented x3, good insight and cognition, good recent to remote recall. Lymph node survey: No cervical lymphadenopathy noted.   Data Reviewed: Basic Metabolic Panel:  Recent Labs Lab 06/01/14 1500 06/02/14 0530  NA 136 135  K 3.6 3.6  CL 108 108  CO2 21 23  GLUCOSE 89 91  BUN 6 6  CREATININE 0.43* 0.36*  CALCIUM 8.8 8.4   Liver Function Tests:  Recent Labs Lab 02-Jun-2014 1500 06/02/14 0530  AST 23 19  ALT 15 13  ALKPHOS 46 38*  BILITOT 1.7* 1.8*  PROT 7.5 6.4  ALBUMIN 4.2 3.4*   No results for input(s): LIPASE, AMYLASE in the last 168 hours. No results for input(s): AMMONIA in the last 168 hours. CBC:  Recent Labs Lab June 02, 2014 1500 06/02/14 0530  WBC 15.2* 15.4*  NEUTROABS 10.9* 10.4*  HGB 8.9* 8.0*  HCT 26.0* 22.8*  MCV 88.1 87.7  PLT 483* 370   Cardiac Enzymes: No results for input(s):  CKTOTAL, CKMB, CKMBINDEX, TROPONINI in the last 168 hours. BNP (last 3 results) No results for input(s): PROBNP in the last 8760 hours. CBG: No results for input(s): GLUCAP in the last 168 hours.  No results found for this or any previous visit (from the past 240 hour(s)).   Studies: US Ob Comp Less 14 Wks  02-Jun-2014   CLINICAL DATA:  Pelvic pain. Ten weeks 1 day by last menstrual period. Sickle cell.  EXAM: OBSTETRIC <14 WK [place] AND TRANSVAGINAL OB US  TECHNIQUE: Both transabdominal and transvaginal ultrasound examinations were performed for complete evaluation of the gestation as well as the maternal uterus, adnexal regions, and pelvic cul-de-sac. Transvaginal technique was performed to assess early pregnancy.  COMPARISON:  None.  FINDINGS: Intrauterine gestational sac: Visualized/normal in shape.  Yolk sac:  Visualized  Embryo:  Visualized  Cardiac Activity: Visualized  Heart Rate:  152 bpm  MSD:    mm    w     d  CRL:   34.7  mm   10 w 3 d                  [place] EDC:  Maternal uterus/adnexae: Small subchorionic hemorrhage. No adnexal masses. No free fluid.  IMPRESSION: Small subchorionic hemorrhage.  No adnexal masses.  No free fluid.   Electronically Signed   By: Rolm Baptise M.D.   On: June 02, 2014 17:25   US Ob Transvaginal  06-02-2014   CLINICAL DATA:  Pelvic pain. Ten weeks 1 day by last menstrual period. Sickle cell.  EXAM: OBSTETRIC <14 WK [place] AND TRANSVAGINAL OB US  TECHNIQUE: Both transabdominal and transvaginal ultrasound examinations were performed for complete evaluation of the gestation as well as the maternal uterus, adnexal regions, and pelvic cul-de-sac. Transvaginal technique was performed to assess early pregnancy.  COMPARISON:  None.  FINDINGS: Intrauterine gestational sac: Visualized/normal in shape.  Yolk sac:  Visualized  Embryo:  Visualized  Cardiac Activity: Visualized  Heart Rate:  152 bpm  MSD:    mm    w     d  CRL:   34.7  mm   10 w 3 d                  [place] EDC:  Maternal  uterus/adnexae: Small subchorionic hemorrhage. No adnexal masses. No free fluid.  IMPRESSION: Small subchorionic hemorrhage.  No adnexal masses.  No free fluid.   Electronically Signed   By: Rolm Baptise M.D.   On: 06/02/14 17:25   Ir Fluoro Guide Cv Line Left  05/03/2014   CLINICAL DATA:  24 year old with sickle cell crisis. Patient needs IV antibiotics.  EXAM: PLACEMENT OF PICC LINE WITH ULTRASOUND AND FLUOROSCOPIC GUIDANCE  FLUOROSCOPY TIME:  12 seconds  TECHNIQUE: The procedure was explained to the patient. The risks and benefits of the procedure were discussed and the  patient's questions were addressed. Informed consent was obtained from the patient. In particular, the risks of fluoroscopy and pregnancy were discussed with the patient. The patient's pelvis was shielded with a lead apron for this procedure.  The right arm was prepped with chlorhexidine, draped in the usual sterile fashion using maximum barrier technique (cap and mask, sterile gown, sterile gloves, large sterile sheet, hand hygiene and cutaneous antiseptic). Local anesthesia was attained by infiltration with 1% lidocaine.  Ultrasound demonstrated patency of a right brachia vein. Under real-time ultrasound guidance, this vein was accessed with a 21 gauge micropuncture needle. Wire would not advance centrally.  Attention was directed to the left arm. Ultrasound confirmed a patent left brachial vein. The left arm was prepped and draped in sterile fashion. Maximal barrier sterile technique was utilized including caps, mask, sterile gowns, sterile gloves, sterile drape, hand hygiene and skin antiseptic. 1% lidocaine was used for local anesthetic. 21 gauge needle directed into a left brachial vein with ultrasound guidance. Wire was advanced centrally. A peel-away sheath was placed. A dual lumen 5 [place] Power PICC line was cut to 37 cm. Catheter was advanced into the central venous system with fluoroscopy. Tip placed at the superior cavoatrial  junction. Both lumens aspirated and flushed well. Catheter was secured to the skin.  COMPLICATIONS: None.  The patient tolerated the procedure well.  IMPRESSION: Successful placement of a left arm PICC with sonographic and fluoroscopic guidance. The catheter is ready for use.  Please note that a PICC line could not be successfully placed from the right arm.   Electronically Signed   By: Markus Daft M.D.   On: 05/03/2014 18:51   Ir US Guide Vasc Access Left  05/03/2014   CLINICAL DATA:  24 year old with sickle cell crisis. Patient needs IV antibiotics.  EXAM: PLACEMENT OF PICC LINE WITH ULTRASOUND AND FLUOROSCOPIC GUIDANCE  FLUOROSCOPY TIME:  12 seconds  TECHNIQUE: The procedure was explained to the patient. The risks and benefits of the procedure were discussed and the patient's questions were addressed. Informed consent was obtained from the patient. In particular, the risks of fluoroscopy and pregnancy were discussed with the patient. The patient's pelvis was shielded with a lead apron for this procedure.  The right arm was prepped with chlorhexidine, draped in the usual sterile fashion using maximum barrier technique (cap and mask, sterile gown, sterile gloves, large sterile sheet, hand hygiene and cutaneous antiseptic). Local anesthesia was attained by infiltration with 1% lidocaine.  Ultrasound demonstrated patency of a right brachia vein. Under real-time ultrasound guidance, this vein was accessed with a 21 gauge micropuncture needle. Wire would not advance centrally.  Attention was directed to the left arm. Ultrasound confirmed a patent left brachial vein. The left arm was prepped and draped in sterile fashion. Maximal barrier sterile technique was utilized including caps, mask, sterile gowns, sterile gloves, sterile drape, hand hygiene and skin antiseptic. 1% lidocaine was used for local anesthetic. 21 gauge needle directed into a left brachial vein with ultrasound guidance. Wire was advanced centrally. A  peel-away sheath was placed. A dual lumen 5 [place] Power PICC line was cut to 37 cm. Catheter was advanced into the central venous system with fluoroscopy. Tip placed at the superior cavoatrial junction. Both lumens aspirated and flushed well. Catheter was secured to the skin.  COMPLICATIONS: None.  The patient tolerated the procedure well.  IMPRESSION: Successful placement of a left arm PICC with sonographic and fluoroscopic guidance. The catheter is ready for use.  Please note  that a PICC line could not be successfully placed from the right arm.   Electronically Signed   By: Markus Daft M.D.   On: 05/03/2014 18:51    Scheduled Meds: . folic acid  1 mg Oral Daily  . ketorolac  30 mg Intravenous 3 times per day  . prenatal multivitamin  1 tablet Oral Q1200  . senna  1 tablet Oral BID   Continuous Infusions: . sodium chloride 125 mL/hr at 06/02/14 6962    Principal Problem:   Sickle cell pain crisis Active Problems:   Nausea & vomiting   Streptococcal sore throat   Assessment/Plan: [name] is a 24yo with sickle-cell disease who is currently [redacted] weeks pregnant who initially presented to the ER with symptoms consistent with vaso-occlusive crisis. She is currently admitted to Ob/gyn for monitoring of her pregnancy due to a subchorionic hemorrhage found on ultrasound.  Recommendations: 1. Sickle Cell Crisis: Discontinue Dilaudid injections. Start her on a Dilaudid PCA at 0.6mg  q 10 min with a lockout at 3mg  per hour. Continue home Methadone 5mg  QHS for long acting control. Discontinue Toradol, but will defer decision to OB/Gyn.Continue bowel regimen as constipation is an adverse effect due to opioid use. Switch IV fluids from NS to D5/0.45% at 177ml/hr. 2. Sickle Cell Care: Continue Folic acid avoid hydroxyurea 3. Anemia: Hgb stable at 8. Transfuse if hgb<7.0 to keep 8.0 or symptomatic. 4. If stable from an OB/Gyn standpoint, please transfer pt to our service.  Thank you for this  consult. Please feel free to call us with any questions or concerns.   [place], [place]

## 2014-06-02 NOTE — Progress Notes (Signed)
Pt is transferred to [facility] per EMS to Room 1345. Stated pain is still the same. IV is patent in RT. AC. No noted  Distress. No equipment needed for transfer.

## 2014-06-02 NOTE — H&P (Signed)
[place] History and Physical  [name] DOB: 03/17/1991 DOA: 06/01/2014   PCP: MATTHEWS,MICHELLE A., MD   Chief Complaint: pain in arm  VBT:[name] is a 24yo with sickle-cell disease who is currently [redacted] weeks pregnant who initially presented to the ER with symptoms consistent with vaso-occlusive crisis. Pt states that her pain began 2 days ago in her right arm and shoulder. She currently rates the pain as a 7/10. She is getting Dilaudid IV, which helps but it makes her sleepy. She was admitted to the [facility] hospital due to concern of a subchorionic hemorrhage found on ultrasound. She is eating and drinking well. Her last bowel movement was today. She is being transferred to our service for further pain management.    Review of Systems:  Constitutional: No weight loss, night sweats, Fevers, chills, fatigue.  HEENT: No headaches, dizziness, seizures, vision changes, difficulty swallowing,Tooth/dental problems,Sore throat, No sneezing, itching, ear ache, nasal congestion, post nasal drip,  Cardio-vascular: No chest pain, Orthopnea, PND, swelling in lower extremities, anasarca, dizziness, palpitations  GI: No heartburn, indigestion, abdominal pain, nausea, vomiting, diarrhea, change in bowel habits, loss of appetite  Resp: No shortness of breath with exertion or at rest. No excess mucus, no productive cough, No non-productive cough, No coughing up of blood.No change in color of mucus.No wheezing.No chest wall deformity  Skin: no rash or lesions.  GU: no dysuria, change in color of urine, no urgency or frequency. No flank pain.  Musculoskeletal: No joint swelling. No decreased range of motion. No back pain. +right arm and shoulder pain Psych: No change in mood or affect. No depression or anxiety. No memory loss.    Past Medical History  Diagnosis Date  . H/O: 1 miscarriage 03/22/2011    Pt reports 2 miscarriages.  [name] 01/08/2009  .  Depression 01/06/2011  . GERD (gastroesophageal reflux disease) 02/17/2011  . Trichotillomania     h/o  . Blood transfusion     "lots"  . Sickle cell anemia with crisis   . Exertional dyspnea     "sometimes"  . Sickle cell anemia   . Headache(784.0)   . Migraines 11/08/11    "@ least twice/month"  . Chronic back pain     "very severe; have knot in my back; from tight muscle; take RX and exercise for it"  . Mood swings 11/08/11    "I go back and forth; real bad"  . Pregnancy   . Blood dyscrasia     SICKLE CELL   Past Surgical History  Procedure Laterality Date  . Cholecystectomy  05/2010  . Dilation and curettage of uterus  02/20/11    S/P miscarriage   Social History:  reports that she quit smoking about 14 months ago. Her smoking use included Cigarettes. She has a .25 pack-year smoking history. She has never used smokeless tobacco. She reports that she does not drink alcohol or use illicit drugs.  Allergies  Allergen Reactions  . Carrot Oil Hives and Swelling  . Latex Rash    Family History  Problem Relation Age of Onset  . Diabetes Mother   . Alcoholism    . Depression    . Hypercholesterolemia    . Hypertension    . Migraines    . Diabetes Maternal Grandmother   . Diabetes Paternal Grandmother     Prior to Admission medications   Medication Sig Start Date End Date Taking? Authorizing Provider  acetaminophen (TYLENOL) 500 MG tablet Take  1,000 mg by mouth every 6 (six) hours as needed for moderate pain or headache.   Yes Historical Provider, MD  folic acid (FOLVITE) 1 MG tablet Take 2 tablets (2 mg total) by mouth daily. 04/24/14  Yes Dorena Dew, FNP  methadone (DOLOPHINE) 5 MG tablet Take 1 tablet (5 mg total) by mouth at bedtime. 05/27/14  Yes Dorena Dew, FNP  Prenatal Vit-Fe Fumarate-FA (PRENATAL MULTIVITAMIN) TABS tablet Take 1 tablet by mouth daily at 12 noon.   Yes Historical Provider, MD  alprazolam Duanne Moron) 2 MG tablet Take 1 tablet by mouth 2 (two)  times daily as needed. anxiety 04/29/14   Historical Provider, MD  Oxycodone HCl 10 MG TABS Take 1.5 tablets (15 mg total) by mouth every 6 (six) hours as needed (moderate pain). 06/02/14 06/10/14  Dorena Dew, FNP   Physical Exam: Filed Vitals:   06/02/14 0200 06/02/14 0539 06/02/14 1222 06/02/14 1753  BP:  104/61 101/62 107/49  Pulse:  79 78 93  Temp:  98.8 F (37.1 C) 98.7 F (37.1 C) 99.4 F (37.4 C)  TempSrc:  Oral Oral Oral  Resp:  18 18 18   Height:      Weight:      SpO2: 100% 100% 100% 100%   General: Alert, awake, oriented x3, in no acute distress.  HEENT: /AT PEERL, EOMI Neck: Trachea midline,  no masses, no thyromegaly, no LAD OROPHARYNX:  Moist, No exudate/ erythema/lesions.  Heart: Regular rate and rhythm, without murmurs, rubs, gallops Lungs: Clear to auscultation, no wheezing or rhonchi noted. No increased vocal fremitus resonant to percussion  Abdomen: Soft, nontender, nondistended, positive bowel sounds, no masses no hepatosplenomegaly noted..  Neuro: No focal neurological deficits noted cranial nerves II through XII grossly intact. Extremities: no c/c/e Psych: A+OX3, good insight and recall  Labs on Admission:   Basic Metabolic Panel:  Recent Labs Lab 06/01/14 1500 06/02/14 0530  NA 136 135  K 3.6 3.6  CL 108 108  CO2 21 23  GLUCOSE 89 91  BUN 6 6  CREATININE 0.43* 0.36*  CALCIUM 8.8 8.4   Liver Function Tests:  Recent Labs Lab 06/01/14 1500 06/02/14 0530  AST 23 19  ALT 15 13  ALKPHOS 46 38*  BILITOT 1.7* 1.8*  PROT 7.5 6.4  ALBUMIN 4.2 3.4*   CBC:  Recent Labs Lab 06/01/14 1500 06/02/14 0530  WBC 15.2* 15.4*  NEUTROABS 10.9* 10.4*  HGB 8.9* 8.0*  HCT 26.0* 22.8*  MCV 88.1 87.7  PLT 483* 370   BNP: Invalid input(s): POCBNP   Radiological Exams on Admission: US Ob Comp Less 14 Wks  06/01/2014   CLINICAL DATA:  Pelvic pain. Ten weeks 1 day by last menstrual period. Sickle cell.  EXAM: OBSTETRIC <14 WK [place] AND  TRANSVAGINAL OB US  TECHNIQUE: Both transabdominal and transvaginal ultrasound examinations were performed for complete evaluation of the gestation as well as the maternal uterus, adnexal regions, and pelvic cul-de-sac. Transvaginal technique was performed to assess early pregnancy.  COMPARISON:  None.  FINDINGS: Intrauterine gestational sac: Visualized/normal in shape.  Yolk sac:  Visualized  Embryo:  Visualized  Cardiac Activity: Visualized  Heart Rate:  152 bpm  MSD:    mm    w     d  CRL:   34.7  mm   10 w 3 d                  [place] EDC:  Maternal uterus/adnexae: Small subchorionic hemorrhage. No  adnexal masses. No free fluid.  IMPRESSION: Small subchorionic hemorrhage.  No adnexal masses.  No free fluid.   Electronically Signed   By: Rolm Baptise M.D.   On: 06/01/2014 17:25   US Ob Transvaginal  06/01/2014   CLINICAL DATA:  Pelvic pain. Ten weeks 1 day by last menstrual period. Sickle cell.  EXAM: OBSTETRIC <14 WK [place] AND TRANSVAGINAL OB US  TECHNIQUE: Both transabdominal and transvaginal ultrasound examinations were performed for complete evaluation of the gestation as well as the maternal uterus, adnexal regions, and pelvic cul-de-sac. Transvaginal technique was performed to assess early pregnancy.  COMPARISON:  None.  FINDINGS: Intrauterine gestational sac: Visualized/normal in shape.  Yolk sac:  Visualized  Embryo:  Visualized  Cardiac Activity: Visualized  Heart Rate:  152 bpm  MSD:    mm    w     d  CRL:   34.7  mm   10 w 3 d                  [place] EDC:  Maternal uterus/adnexae: Small subchorionic hemorrhage. No adnexal masses. No free fluid.  IMPRESSION: Small subchorionic hemorrhage.  No adnexal masses.  No free fluid.   Electronically Signed   By: Rolm Baptise M.D.   On: 06/01/2014 17:25    EKG: Independently reviewed.   Assessment/Plan: Principal Problem:   Sickle cell pain crisis Active Problems:   Nausea & vomiting   Streptococcal sore throat  1. Sickle Cell Crisis: Discontinue Dilaudid  injections. Start her on a Dilaudid PCA at 0.6mg  q 10 min with a lockout at 3mg  per hour. Continue home Methadone 5mg  QHS for long acting control. Discontinue Toradol, but will defer decision to OB/Gyn.Continue bowel regimen as constipation is an adverse effect due to opioid use.  2. Sickle Cell Care: Continue Folic acid avoid hydroxyurea 3. Anemia: Hgb stable at 8. Transfuse if hgb<7.0 to keep 8.0 or symptomatic. 4. Pregnancy: Avoid teratogens. Continue folic acid but will increase to 2 mg daily.  5. FEN/GI : Regular diet IV fluids-Stop NS and start D5/0.45 at 183ml/hr Bowel regimen in place  Code Status: full DVT Prophylaxis: SCDs Family Communication: none Disposition Plan: pending improvement   Time spend: 68 min Kalman Shan, MD  Pager [phone]  If 7PM-7AM, please contact night-coverage [email or web address] Password [facility] 06/02/2014, 6:07 PM

## 2014-06-03 LAB — RETICULOCYTES
RBC.: 2.92 MIL/uL — AB (ref 3.87–5.11)
Retic Count, Absolute: 397.1 10*3/uL — ABNORMAL HIGH (ref 19.0–186.0)
Retic Ct Pct: 13.6 % — ABNORMAL HIGH (ref 0.4–3.1)

## 2014-06-03 LAB — BASIC METABOLIC PANEL
ANION GAP: 8 (ref 5–15)
BUN: <5 mg/dL — ABNORMAL LOW (ref 6–23)
CO2: 26 mmol/L (ref 19–32)
Calcium: 9.3 mg/dL (ref 8.4–10.5)
Chloride: 103 mEq/L (ref 96–112)
Creatinine, Ser: 0.45 mg/dL — ABNORMAL LOW (ref 0.50–1.10)
GFR calc Af Amer: >90 mL/min (ref >90)
GFR calc non Af Amer: >90 mL/min (ref >90)
Glucose, Bld: 107 mg/dL — ABNORMAL HIGH (ref 70–99)
POTASSIUM: 3.1 mmol/L — AB (ref 3.5–5.1)
Sodium: 137 mmol/L (ref 135–145)

## 2014-06-03 LAB — CBC WITH DIFFERENTIAL/PLATELET
BASOS ABS: 0.1 10*3/uL (ref 0.0–0.1)
Basophils Relative: 0 % (ref 0–1)
Eosinophils Absolute: 0.4 10*3/uL (ref 0.0–0.7)
Eosinophils Relative: 2 % (ref 0–5)
HCT: 25.8 % — ABNORMAL LOW (ref 36.0–46.0)
Hemoglobin: 8.8 g/dL — ABNORMAL LOW (ref 12.0–15.0)
LYMPHS ABS: 2.3 10*3/uL (ref 0.7–4.0)
LYMPHS PCT: 11 % — AB (ref 12–46)
MCH: 30.1 pg (ref 26.0–34.0)
MCHC: 34.1 g/dL (ref 30.0–36.0)
MCV: 88.4 fL (ref 78.0–100.0)
MONOS PCT: 7 % (ref 3–12)
Monocytes Absolute: 1.5 10*3/uL — ABNORMAL HIGH (ref 0.1–1.0)
NEUTROS PCT: 80 % — AB (ref 43–77)
Neutro Abs: 16.4 10*3/uL — ABNORMAL HIGH (ref 1.7–7.7)
PLATELETS: 496 10*3/uL — AB (ref 150–400)
RBC: 2.92 MIL/uL — ABNORMAL LOW (ref 3.87–5.11)
RDW: 17.4 % — AB (ref 11.5–15.5)
WBC: 20.7 10*3/uL — ABNORMAL HIGH (ref 4.0–10.5)

## 2014-06-03 LAB — PREPARE RBC (CROSSMATCH)

## 2014-06-03 LAB — LACTATE DEHYDROGENASE: LDH: 264 U/L — AB (ref 94–250)

## 2014-06-03 MED ORDER — HYDROMORPHONE HCL 1 MG/ML IJ SOLN
1.0000 mg | INTRAMUSCULAR | Status: DC | PRN
  Administered 2014-06-03 – 2014-06-05 (×20): 1 mg via INTRAVENOUS
  Filled 2014-06-03 (×22): qty 1

## 2014-06-03 MED ORDER — ENOXAPARIN SODIUM 40 MG/0.4ML ~~LOC~~ SOLN
40.0000 mg | SUBCUTANEOUS | Status: DC
Start: 2014-06-03 — End: 2014-06-05
  Filled 2014-06-03 (×3): qty 0.4

## 2014-06-03 MED ORDER — POTASSIUM CHLORIDE CRYS ER 20 MEQ PO TBCR
40.0000 meq | EXTENDED_RELEASE_TABLET | ORAL | Status: AC
  Administered 2014-06-03 (×3): 40 meq via ORAL
  Filled 2014-06-03 (×3): qty 2

## 2014-06-03 MED ORDER — METHADONE HCL 5 MG PO TABS
5.0000 mg | ORAL_TABLET | Freq: Every day | ORAL | Status: DC
  Administered 2014-06-03: 5 mg via ORAL
  Filled 2014-06-03 (×2): qty 1

## 2014-06-03 MED ORDER — SENNOSIDES-DOCUSATE SODIUM 8.6-50 MG PO TABS
1.0000 | ORAL_TABLET | Freq: Two times a day (BID) | ORAL | Status: DC
  Administered 2014-06-03 – 2014-06-04 (×2): 1 via ORAL
  Filled 2014-06-03 (×5): qty 1

## 2014-06-03 MED ORDER — SODIUM CHLORIDE 0.9 % IV SOLN: Freq: Once | INTRAVENOUS | Status: DC

## 2014-06-03 MED ORDER — HYDROMORPHONE 2 MG/ML HIGH CONCENTRATION IV PCA SOLN
INTRAVENOUS | Status: DC
  Administered 2014-06-03: 7.2 mg via INTRAVENOUS
  Administered 2014-06-03: 23:00:00 via INTRAVENOUS
  Administered 2014-06-03: 4.2 mg via INTRAVENOUS
  Administered 2014-06-03: 4.8 mg via INTRAVENOUS
  Administered 2014-06-03: 4.2 mg via INTRAVENOUS
  Administered 2014-06-04: 1.2 mg via INTRAVENOUS
  Administered 2014-06-04: 2.4 mg via INTRAVENOUS
  Administered 2014-06-04: 4.8 mg via INTRAVENOUS
  Filled 2014-06-03: qty 25

## 2014-06-03 NOTE — Progress Notes (Signed)
13ml of PCA Dilaudid wasted in the sink. Witnessed by Nadine,RN.

## 2014-06-03 NOTE — Progress Notes (Signed)
Patient is on a high concentration Dialudid PCA. Total drug from [phone] was 22.2,total demands-38,delivered-37.- Sandie Ano RN

## 2014-06-03 NOTE — Progress Notes (Signed)
SICKLE CELL SERVICE PROGRESS NOTE  [name] PYP:[id number] DOB: December 02, 1990 DOA: 06/01/2014 PCP: MATTHEWS,MICHELLE A., MD  Assessment/Plan: Principal Problem:   Sickle cell pain crisis Active Problems:   Nausea & vomiting   Streptococcal sore throat  1. Hb SS with crisis: Pt who is [redacted] weeks GA by last menstrual period is here with vaso-occlusive episode. She rates her pain as 6/10 and mostly localized to right arm. She reports that the pain has been slower to resolve than usual. This is likely due to the absence of the anti-inflammatory effects of Toradol due to it's teratogenic effects. I will increase the 1 hour limit to 3.6 mg and PRN clinician assisted doses of 1 mg every 3 hours. Resume Methadone. 2. Leukocytosis: This is likely due to her crisis as she has no evidence of infection. He U/A was negative for elements consistent with UTI. Will continue to monitor. 3. Intra Uterine Pregnancy: Pt is estimated 10 w 4 d by Obstetric U/S. She was evaluated by Obstetrics for a small sub-chorionic hemorrhage and felt to be stable from an obstetrical standpoint. Her Hb S% was last known 2 years ago and was at 77%. There is no expectation that it would be less as she has not been compliant with Hydrea. The goal during pregnancy is to keep % of Hb S < 30%, thus she will receive an exchange transfusion after blood has been collected for electrophoresis. 4. Anemia of Chronic Disease: Hb stable. See above 5. Chronic pain: Continue Methadone   Code Status: Full Code Family Communication: N/A Disposition Plan: Not yet ready for discharge  [place].  Pager [phone]. If 7PM-7AM, please contact night-coverage.  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 Pt states that her pain began 2 days ago in her right  arm and shoulder. She currently rates the pain as a 7/10. She is getting Dilaudid IV, which helps but it makes her sleepy. She was admitted to the [facility] hospital due to concern of a subchorionic hemorrhage found on ultrasound. She is eating and drinking well. Her last bowel movement was today. She is being transferred to our service for further pain management  Consultants:  Dr. Ernie Avena: Ob-Gyn  Procedures:  None  Antibiotics:  None  HPI/Subjective: Pt awake and alert today. She rates her pain as 6/10 and mostly localized to right arm. Last BM yesterday.  Objective: Filed Vitals:   06/02/14 2200 06/03/14 0443 06/03/14 0459 06/03/14 0600  BP:   99/52   Pulse:   93   Temp:   98.2 F (36.8 C)   TempSrc:   Oral   Resp: 16 13 14 14   Height:      Weight:   163 lb (73.936 kg)   SpO2: 100% 98% 98% 98%   Weight change: 0 lb (0 kg)  Intake/Output Summary (Last 24 hours) at 06/03/14 1037 Last data filed at 06/03/14 0449  Gross per 24 hour  Intake 1173.33 ml  Output      0 ml  Net 1173.33 ml    General: Alert, awake, oriented x3, in no acute distress.  HEENT: [place]/AT PEERL, EOMI, anicteric. Neck: Trachea midline,  no masses, no thyromegal,y no JVD, no carotid bruit OROPHARYNX:  Moist, No exudate/ erythema/lesions.  Heart: Regular rate and rhythm, without murmurs, rubs, gallops, PMI non-displaced, no heaves  or thrills on palpation.  Lungs: Clear to auscultation, no wheezing or rhonchi noted. No increased vocal fremitus resonant to percussion  Abdomen: Soft, nontender, nondistended, positive bowel sounds, no masses no hepatosplenomegaly noted..  Neuro: No focal neurological deficits noted cranial nerves II through XII grossly intact.  Strength normal in bilateral upper and lower extremities. Musculoskeletal: No warm swelling or erythema around joints, no spinal tenderness noted. Psychiatric: Patient alert and oriented x3, good insight and cognition, good recent to remote  recall. Lymph node survey: No cervical axillary or inguinal lymphadenopathy noted.   Data Reviewed: Basic Metabolic Panel:  Recent Labs Lab 06/06/2014 1500 06/02/14 0530 06/03/14 0500  NA 136 135 137  K 3.6 3.6 3.1*  CL 108 108 103  CO2 21 23 26   GLUCOSE 89 91 107*  BUN 6 6 <5*  CREATININE 0.43* 0.36* 0.45*  CALCIUM 8.8 8.4 9.3   Liver Function Tests:  Recent Labs Lab 2014-06-06 1500 06/02/14 0530  AST 23 19  ALT 15 13  ALKPHOS 46 38*  BILITOT 1.7* 1.8*  PROT 7.5 6.4  ALBUMIN 4.2 3.4*   No results for input(s): LIPASE, AMYLASE in the last 168 hours. No results for input(s): AMMONIA in the last 168 hours. CBC:  Recent Labs Lab 06-06-2014 1500 06/02/14 0530 06/03/14 0500  WBC 15.2* 15.4* 20.7*  NEUTROABS 10.9* 10.4* 16.4*  HGB 8.9* 8.0* 8.8*  HCT 26.0* 22.8* 25.8*  MCV 88.1 87.7 88.4  PLT 483* 370 496*   Cardiac Enzymes: No results for input(s): CKTOTAL, CKMB, CKMBINDEX, TROPONINI in the last 168 hours. BNP (last 3 results) No results for input(s): PROBNP in the last 8760 hours. CBG: No results for input(s): GLUCAP in the last 168 hours.  No results found for this or any previous visit (from the past 240 hour(s)).   Studies: US Ob Comp Less 14 Wks  06/06/14   CLINICAL DATA:  Pelvic pain. Ten weeks 1 day by last menstrual period. Sickle cell.  EXAM: OBSTETRIC <14 WK [place] AND TRANSVAGINAL OB US  TECHNIQUE: Both transabdominal and transvaginal ultrasound examinations were performed for complete evaluation of the gestation as well as the maternal uterus, adnexal regions, and pelvic cul-de-sac. Transvaginal technique was performed to assess early pregnancy.  COMPARISON:  None.  FINDINGS: Intrauterine gestational sac: Visualized/normal in shape.  Yolk sac:  Visualized  Embryo:  Visualized  Cardiac Activity: Visualized  Heart Rate:  152 bpm  MSD:    mm    w     d  CRL:   34.7  mm   10 w 3 d                  [place] EDC:  Maternal uterus/adnexae: Small subchorionic hemorrhage.  No adnexal masses. No free fluid.  IMPRESSION: Small subchorionic hemorrhage.  No adnexal masses.  No free fluid.   Electronically Signed   By: Rolm Baptise M.D.   On: 06/06/2014 17:25   US Ob Transvaginal  06/06/14   CLINICAL DATA:  Pelvic pain. Ten weeks 1 day by last menstrual period. Sickle cell.  EXAM: OBSTETRIC <14 WK [place] AND TRANSVAGINAL OB US  TECHNIQUE: Both transabdominal and transvaginal ultrasound examinations were performed for complete evaluation of the gestation as well as the maternal uterus, adnexal regions, and pelvic cul-de-sac. Transvaginal technique was performed to assess early pregnancy.  COMPARISON:  None.  FINDINGS: Intrauterine gestational sac: Visualized/normal in shape.  Yolk sac:  Visualized  Embryo:  Visualized  Cardiac Activity: Visualized  Heart Rate:  152 bpm  MSD:    mm    w     d  CRL:   34.7  mm   10 w 3 d                  [place] EDC:  Maternal uterus/adnexae: Small subchorionic hemorrhage. No adnexal masses. No free fluid.  IMPRESSION: Small subchorionic hemorrhage.  No adnexal masses.  No free fluid.   Electronically Signed   By: Rolm Baptise M.D.   On: 06/01/2014 17:25    Scheduled Meds: . enoxaparin (LOVENOX) injection  40 mg Subcutaneous Q24H  . folic acid  2 mg Oral Daily  . HYDROmorphone PCA 2 mg/mL   Intravenous 6 times per day  . prenatal multivitamin  1 tablet Oral Q1200  . senna-docusate  1 tablet Oral BID   Continuous Infusions: . dextrose 5 % and 0.45% NaCl 100 mL/hr at 06/03/14 0449    Time spent 35 minutes

## 2014-06-03 NOTE — Progress Notes (Signed)
Patient's high concentration PCA dose changed per order.PCA patient bolus dose-0.6, lockout-10 minutes,one hour dose limit 3.6 mg. Changes witnessd by Verl Dicker. RN

## 2014-06-04 DIAGNOSIS — D72829 Elevated white blood cell count, unspecified: Secondary | ICD-10-CM

## 2014-06-04 DIAGNOSIS — E876 Hypokalemia: Secondary | ICD-10-CM

## 2014-06-04 LAB — CBC WITH DIFFERENTIAL/PLATELET
BASOS ABS: 0 10*3/uL (ref 0.0–0.1)
Basophils Relative: 0 % (ref 0–1)
EOS ABS: 0.4 10*3/uL (ref 0.0–0.7)
EOS PCT: 3 % (ref 0–5)
HEMATOCRIT: 25.2 % — AB (ref 36.0–46.0)
HEMOGLOBIN: 8.7 g/dL — AB (ref 12.0–15.0)
Lymphocytes Relative: 18 % (ref 12–46)
Lymphs Abs: 2.8 10*3/uL (ref 0.7–4.0)
MCH: 30.1 pg (ref 26.0–34.0)
MCHC: 34.5 g/dL (ref 30.0–36.0)
MCV: 87.2 fL (ref 78.0–100.0)
MONOS PCT: 11 % (ref 3–12)
Monocytes Absolute: 1.7 10*3/uL — ABNORMAL HIGH (ref 0.1–1.0)
Neutro Abs: 11 10*3/uL — ABNORMAL HIGH (ref 1.7–7.7)
Neutrophils Relative %: 68 % (ref 43–77)
Platelets: 410 10*3/uL — ABNORMAL HIGH (ref 150–400)
RBC: 2.89 MIL/uL — ABNORMAL LOW (ref 3.87–5.11)
RDW: 16.4 % — AB (ref 11.5–15.5)
WBC: 15.9 10*3/uL — ABNORMAL HIGH (ref 4.0–10.5)

## 2014-06-04 LAB — BASIC METABOLIC PANEL
Anion gap: 7 (ref 5–15)
BUN: 6 mg/dL (ref 6–23)
CHLORIDE: 104 meq/L (ref 96–112)
CO2: 24 mmol/L (ref 19–32)
Calcium: 8.8 mg/dL (ref 8.4–10.5)
Creatinine, Ser: 0.55 mg/dL (ref 0.50–1.10)
GFR calc Af Amer: >90 mL/min (ref >90)
GFR calc non Af Amer: >90 mL/min (ref >90)
GLUCOSE: 102 mg/dL — AB (ref 70–99)
POTASSIUM: 4.1 mmol/L (ref 3.5–5.1)
Sodium: 135 mmol/L (ref 135–145)

## 2014-06-04 LAB — TYPE AND SCREEN
ABO/RH(D): B POS
ANTIBODY SCREEN: NEGATIVE
Donor AG Type: NEGATIVE
Unit division: 0

## 2014-06-04 LAB — MAGNESIUM: Magnesium: 1.7 mg/dL (ref 1.5–2.5)

## 2014-06-04 MED ORDER — HYDROMORPHONE 2 MG/ML HIGH CONCENTRATION IV PCA SOLN
INTRAVENOUS | Status: DC
  Administered 2014-06-04: 3 mg via INTRAVENOUS
  Administered 2014-06-04: 4.2 mg via INTRAVENOUS
  Administered 2014-06-04: 3.6 mg via INTRAVENOUS
  Administered 2014-06-05: 1.6 mg via INTRAVENOUS
  Administered 2014-06-05 (×2): 0.8 mg via INTRAVENOUS
  Administered 2014-06-05: 2.8 mg via INTRAVENOUS

## 2014-06-04 MED ORDER — OXYCODONE HCL 5 MG PO TABS
15.0000 mg | ORAL_TABLET | ORAL | Status: DC
  Administered 2014-06-04 – 2014-06-05 (×9): 15 mg via ORAL
  Filled 2014-06-04 (×9): qty 3

## 2014-06-04 MED ORDER — MAGNESIUM SULFATE 2 GM/50ML IV SOLN
2.0000 g | Freq: Once | INTRAVENOUS | Status: AC
  Administered 2014-06-04: 2 g via INTRAVENOUS
  Filled 2014-06-04: qty 50

## 2014-06-04 NOTE — Progress Notes (Signed)
SICKLE CELL SERVICE PROGRESS NOTE  [name] JTT:[id number] DOB: 1990-11-21 DOA: 06/01/2014 PCP: Kymberli Wiegand A., MD  Assessment/Plan: Principal Problem:   Sickle cell pain crisis Active Problems:   Nausea & vomiting   Streptococcal sore throat  1. Hb SS with crisis: Pt who is [redacted] weeks GA by last menstrual period is here with vaso-occlusive episode. She rates her pain as 5/10 and mostly localized to right arm. She reports that the pain has been slower to resolve than usual. This is likely due to the absence of the anti-inflammatory effects of Toradol due to it's teratogenic effects. I will continue PCA at decreased dose and schedule Oxycodone and contiue PRN clinician assisted doses of 1 mg every 3 hours. Continue Methadone. Anticipate discharge home tomorrow. Pt to pick up prescription from clinic for her Oxycodone. 2. Leukocytosis: Improved since partial exchange transfusion. This is likely due to her crisis as she has no evidence of infection. Her U/A was negative for elements consistent with UTI. Will continue to monitor. 3. Intra Uterine Pregnancy: Pt is estimated 10 w 4 d by Obstetric U/S. She was evaluated by Obstetrics for a small sub-chorionic hemorrhage and felt to be stable from an obstetrical standpoint. Her Hb S% was last known 2 years ago and was at 77%. There is no expectation that it would be less as she has not been compliant with Hydrea. The goal during pregnancy is to keep % of Hb S < 30%. She received an exchange transfusion last night, butr electrophoresis still pending. 4. Anemia of Chronic Disease: Hb stable. See above 5. Chronic pain: Continue Methadone 6. Hypokalemia: Potassium normal today after oral supplementation. Magnesium noted to be low normal will replace.   Code Status: Full Code Family Communication: N/A Disposition Plan: Not yet ready for discharge  [place].  Pager [phone]. If 7PM-7AM, please contact night-coverage.  06/04/2014,  12:19 PM  LOS: 3 days   Admitting History: [name] is a 24yo with sickle-cell disease who is currently [redacted] weeks pregnant who initially presented to the ER with symptoms consistent with vaso-occlusive crisis. Pt states that her pain began 2 days ago in her right arm and shoulder. She currently rates the pain as a 7/10. She is getting Dilaudid IV, which helps but it makes her sleepy. She was admitted to the [facility] hospital due to concern of a subchorionic hemorrhage found on ultrasound. She is eating and drinking well. Her last bowel movement was today. She is being transferred to our service for further pain management  Consultants:  Dr. Nehemiah Settle: Ob-Gyn  Procedures:  None  Antibiotics:  None  HPI/Subjective: Pt awake and alert today. She rates her pain as 5/10 and mostly localized to upper back. Last BM yesterday.  Objective: Filed Vitals:   06/04/14 0135 06/04/14 0524 06/04/14 0613 06/04/14 0941  BP:   94/47   Pulse:   88   Temp:   97.8 F (36.6 C)   TempSrc:   Oral   Resp: 14 14 13 14   Height:      Weight:   164 lb 7.4 oz (74.6 kg)   SpO2: 99%  97% 99%   Weight change: 1 lb 7.4 oz (0.664 kg)  Intake/Output Summary (Last 24 hours) at 06/04/14 1219 Last data filed at 06/04/14 0615  Gross per 24 hour  Intake 2934.33 ml  Output   1800 ml  Net 1134.33 ml    General: Alert, awake, oriented x3, in no acute distress.  HEENT: [place]/AT PEERL, EOMI,  anicteric. Heart: Regular rate and rhythm, without murmurs, rubs, gallops, PMI non-displaced, no heaves or thrills on palpation.  Lungs: Clear to auscultation, no wheezing or rhonchi noted. No increased vocal fremitus resonant to percussion  Abdomen: Soft, nontender, nondistended, positive bowel sounds, no masses no hepatosplenomegaly noted.  Neuro: No focal neurological deficits noted cranial nerves II through XII grossly intact.  Strength normal in bilateral upper and lower extremities. Musculoskeletal: No warm swelling or  erythema around joints, no spinal tenderness noted. Psychiatric: Patient alert and oriented x3, good insight and cognition, good recent to remote recall. Lymph node survey: No cervical axillary or inguinal lymphadenopathy noted.   Data Reviewed: Basic Metabolic Panel:  Recent Labs Lab 06/02/14 1500 06/02/14 0530 06/03/14 0500 06/04/14 0548  NA 136 135 137 135  K 3.6 3.6 3.1* 4.1  CL 108 108 103 104  CO2 21 23 26 24   GLUCOSE 89 91 107* 102*  BUN 6 6 <5* 6  CREATININE 0.43* 0.36* 0.45* 0.55  CALCIUM 8.8 8.4 9.3 8.8  MG  --   --   --  1.7   Liver Function Tests:  Recent Labs Lab 06-02-14 1500 06/02/14 0530  AST 23 19  ALT 15 13  ALKPHOS 46 38*  BILITOT 1.7* 1.8*  PROT 7.5 6.4  ALBUMIN 4.2 3.4*   No results for input(s): LIPASE, AMYLASE in the last 168 hours. No results for input(s): AMMONIA in the last 168 hours. CBC:  Recent Labs Lab 06/02/14 1500 06/02/14 0530 06/03/14 0500 06/04/14 0548  WBC 15.2* 15.4* 20.7* 15.9*  NEUTROABS 10.9* 10.4* 16.4* 11.0*  HGB 8.9* 8.0* 8.8* 8.7*  HCT 26.0* 22.8* 25.8* 25.2*  MCV 88.1 87.7 88.4 87.2  PLT 483* 370 496* 410*   Cardiac Enzymes: No results for input(s): CKTOTAL, CKMB, CKMBINDEX, TROPONINI in the last 168 hours. BNP (last 3 results) No results for input(s): PROBNP in the last 8760 hours. CBG: No results for input(s): GLUCAP in the last 168 hours.  No results found for this or any previous visit (from the past 240 hour(s)).   Studies: US Ob Comp Less 14 Wks  06-02-2014   CLINICAL DATA:  Pelvic pain. Ten weeks 1 day by last menstrual period. Sickle cell.  EXAM: OBSTETRIC <14 WK [place] AND TRANSVAGINAL OB US  TECHNIQUE: Both transabdominal and transvaginal ultrasound examinations were performed for complete evaluation of the gestation as well as the maternal uterus, adnexal regions, and pelvic cul-de-sac. Transvaginal technique was performed to assess early pregnancy.  COMPARISON:  None.  FINDINGS: Intrauterine  gestational sac: Visualized/normal in shape.  Yolk sac:  Visualized  Embryo:  Visualized  Cardiac Activity: Visualized  Heart Rate:  152 bpm  MSD:    mm    w     d  CRL:   34.7  mm   10 w 3 d                  [place] EDC:  Maternal uterus/adnexae: Small subchorionic hemorrhage. No adnexal masses. No free fluid.  IMPRESSION: Small subchorionic hemorrhage.  No adnexal masses.  No free fluid.   Electronically Signed   By: Rolm Baptise M.D.   On: 06-02-14 17:25   US Ob Transvaginal  06/02/2014   CLINICAL DATA:  Pelvic pain. Ten weeks 1 day by last menstrual period. Sickle cell.  EXAM: OBSTETRIC <14 WK [place] AND TRANSVAGINAL OB US  TECHNIQUE: Both transabdominal and transvaginal ultrasound examinations were performed for complete evaluation of the gestation as well as  the maternal uterus, adnexal regions, and pelvic cul-de-sac. Transvaginal technique was performed to assess early pregnancy.  COMPARISON:  None.  FINDINGS: Intrauterine gestational sac: Visualized/normal in shape.  Yolk sac:  Visualized  Embryo:  Visualized  Cardiac Activity: Visualized  Heart Rate:  152 bpm  MSD:    mm    w     d  CRL:   34.7  mm   10 w 3 d                  [place] EDC:  Maternal uterus/adnexae: Small subchorionic hemorrhage. No adnexal masses. No free fluid.  IMPRESSION: Small subchorionic hemorrhage.  No adnexal masses.  No free fluid.   Electronically Signed   By: Rolm Baptise M.D.   On: 06/01/2014 17:25    Scheduled Meds: . sodium chloride   Intravenous Once  . enoxaparin (LOVENOX) injection  40 mg Subcutaneous Q24H  . folic acid  2 mg Oral Daily  . HYDROmorphone PCA 2 mg/mL   Intravenous 6 times per day  . methadone  5 mg Oral QHS  . prenatal multivitamin  1 tablet Oral Q1200  . senna-docusate  1 tablet Oral BID   Continuous Infusions: . dextrose 5 % and 0.45% NaCl 100 mL/hr at 06/03/14 2157    Time spent 35 minutes

## 2014-06-04 NOTE — Progress Notes (Signed)
Pt ambulated loop around 3 [place] and walked to beach mural, tolerated well.  Reports joint pain better in R arm, but expresses pain in lower right side of back rating 5/10.  Heating pad set up for patient use.

## 2014-06-05 LAB — HEMOGLOBINOPATHY EVALUATION
HEMOGLOBIN OTHER: 0 %
HGB A2 QUANT: 2.7 % (ref 2.2–3.2)
HGB F QUANT: 17.2 % — AB (ref 0.0–2.0)
Hgb A: 22.1 % — ABNORMAL LOW (ref 96.8–97.8)
Hgb S Quant: 58 % — ABNORMAL HIGH

## 2014-06-05 NOTE — Care Management Note (Signed)
CARE MANAGEMENT NOTE 06/05/2014  Patient:  [name], [name]   Account Number:  [id number]  Date Initiated:  06/05/2014  Documentation initiated by:  Marney Doctor  Subjective/Objective Assessment:   24 yo admitted with [facility]     Action/Plan:   From home with significant other   Anticipated DC Date:  06/05/2014   Anticipated DC Plan:  [name]  CM consult      Choice offered to / List presented to:             Status of service:  In process, will continue to follow Medicare Important Message given?  YES (If response is "NO", the following Medicare IM given date fields will be blank) Date Medicare IM given:  06/05/2014 Medicare IM given by:  Marney Doctor Date Additional Medicare IM given:   Additional Medicare IM given by:    Discharge Disposition:    Per UR Regulation:    If discussed at Long Length of Stay Meetings, dates discussed:    Comments:

## 2014-06-05 NOTE — Progress Notes (Signed)
Ms. Ditullio was discharged while still on the PCA. Wasted 6 mL of 2:1 concentrated dilaudid in the sink.

## 2014-06-05 NOTE — Discharge Summary (Signed)
Physician Discharge Summary  Patient ID: [name] MRN: [id number] DOB/AGE: March 14, 1991 24 y.o.  Admit date: 06/01/2014 Discharge date: 06/05/2014  Admission Diagnoses:  Discharge Diagnoses:  Principal Problem:   Sickle cell pain crisis Active Problems:   Nausea & vomiting   Streptococcal sore throat   Discharged Condition: good  Hospital Course: A 24 year old female admitted with sickle cell painful crisis. Patient is in her first trimester pregnancy. She was treated with IV Dilaudid IV fluid as well as supportive care. She was material half prenatal vitamins as well as oral oxycodone. She is doing much better now. Hemoglobin remained stable. She is being discharged home to follow with Dr. Zigmund Daniel. She will pick up her prescription at the sickle cell clinic.  Consults: None  Significant Diagnostic Studies: labs: CBCs and CMP is performed. Most within normal limits.  Treatments: IV hydration and analgesia: Dilaudid  Discharge Exam: Blood pressure 99/41, pulse 95, temperature 99.5 F (37.5 C), temperature source Oral, resp. rate 9, height 5\' 2"  (1.575 m), weight 74.6 kg (164 lb 7.4 oz), last menstrual period 03/20/2014, SpO2 100 %. General appearance: alert, cooperative and no distress Eyes: conjunctivae/corneas clear. PERRL, EOM's intact. Fundi benign. Ears: normal TM's and external ear canals both ears Neck: no adenopathy, no carotid bruit, no JVD, supple, symmetrical, trachea midline and thyroid not enlarged, symmetric, no tenderness/mass/nodules Back: symmetric, no curvature. ROM normal. No CVA tenderness. Resp: clear to auscultation bilaterally Chest wall: no tenderness Cardio: regular rate and rhythm, S1, S2 normal, no murmur, click, rub or gallop GI: soft, non-tender; bowel sounds normal; no masses,  no organomegaly Extremities: extremities normal, atraumatic, no cyanosis or edema Pulses: 2+ and symmetric Skin: Skin color, texture, turgor normal. No rashes or  lesions Neurologic: Grossly normal  Disposition: 01-Home or Self Care     Medication List    TAKE these medications        acetaminophen 500 MG tablet  Commonly known as:  TYLENOL  Take 1,000 mg by mouth every 6 (six) hours as needed for moderate pain or headache.     alprazolam 2 MG tablet  Commonly known as:  XANAX  Take 1 tablet by mouth 2 (two) times daily as needed. anxiety     folic acid 1 MG tablet  Commonly known as:  FOLVITE  Take 2 tablets (2 mg total) by mouth daily.     methadone 5 MG tablet  Commonly known as:  DOLOPHINE  Take 1 tablet (5 mg total) by mouth at bedtime.     Oxycodone HCl 10 MG Tabs  Take 1.5 tablets (15 mg total) by mouth every 6 (six) hours as needed (moderate pain).     prenatal multivitamin Tabs tablet  Take 1 tablet by mouth daily at 12 noon.         SignedBarbette Merino 06/05/2014, 10:44 AM  Time spent 32 minutes

## 2014-06-10 ENCOUNTER — Telehealth: Payer: Self-pay

## 2014-06-10 DIAGNOSIS — D571 Sickle-cell disease without crisis: Secondary | ICD-10-CM

## 2014-06-10 DIAGNOSIS — G8929 Other chronic pain: Secondary | ICD-10-CM

## 2014-06-10 NOTE — Telephone Encounter (Signed)
Patient called requesting refill for Oxycodone 10mg  to pick up on Friday. LOV 04/29/2014. Please advise. Thanks!

## 2014-06-11 ENCOUNTER — Other Ambulatory Visit: Payer: Self-pay | Admitting: Physician Assistant

## 2014-06-11 ENCOUNTER — Encounter: Payer: Self-pay | Admitting: Physician Assistant

## 2014-06-11 ENCOUNTER — Other Ambulatory Visit (HOSPITAL_COMMUNITY)
Admission: RE | Admit: 2014-06-11 | Discharge: 2014-06-11 | Disposition: A | Discharge status: Home / Self Care | Payer: Medicare Other | Source: Ambulatory Visit | Attending: Physician Assistant | Admitting: Physician Assistant

## 2014-06-11 ENCOUNTER — Ambulatory Visit (INDEPENDENT_AMBULATORY_CARE_PROVIDER_SITE_OTHER): Payer: Medicare Other | Admitting: Physician Assistant

## 2014-06-11 VITALS — BP 115/74 | HR 97 | Temp 98.5°F | Ht 62.5 in | Wt 163.3 lb

## 2014-06-11 DIAGNOSIS — Z118 Encounter for screening for other infectious and parasitic diseases: Secondary | ICD-10-CM

## 2014-06-11 DIAGNOSIS — A64 Unspecified sexually transmitted disease: Secondary | ICD-10-CM

## 2014-06-11 DIAGNOSIS — Z3682 Encounter for antenatal screening for nuchal translucency: Secondary | ICD-10-CM

## 2014-06-11 DIAGNOSIS — Z124 Encounter for screening for malignant neoplasm of cervix: Secondary | ICD-10-CM

## 2014-06-11 DIAGNOSIS — O0990 Supervision of high risk pregnancy, unspecified, unspecified trimester: Secondary | ICD-10-CM

## 2014-06-11 DIAGNOSIS — Z113 Encounter for screening for infections with a predominantly sexual mode of transmission: Secondary | ICD-10-CM | POA: Insufficient documentation

## 2014-06-11 DIAGNOSIS — Z1151 Encounter for screening for human papillomavirus (HPV): Secondary | ICD-10-CM

## 2014-06-11 DIAGNOSIS — R109 Unspecified abdominal pain: Secondary | ICD-10-CM | POA: Diagnosis not present

## 2014-06-11 DIAGNOSIS — D571 Sickle-cell disease without crisis: Secondary | ICD-10-CM

## 2014-06-11 DIAGNOSIS — Z3481 Encounter for supervision of other normal pregnancy, first trimester: Secondary | ICD-10-CM

## 2014-06-11 DIAGNOSIS — F329 Major depressive disorder, single episode, unspecified: Secondary | ICD-10-CM

## 2014-06-11 DIAGNOSIS — D5700 Hb-SS disease with crisis, unspecified: Secondary | ICD-10-CM | POA: Diagnosis not present

## 2014-06-11 LAB — POCT URINALYSIS DIP (DEVICE)
Bilirubin Urine: NEGATIVE
Glucose, UA: NEGATIVE mg/dL
Ketones, ur: NEGATIVE mg/dL
NITRITE: NEGATIVE
PROTEIN: NEGATIVE mg/dL
Specific Gravity, Urine: 1.01 (ref 1.005–1.030)
Urobilinogen, UA: 0.2 mg/dL (ref 0.0–1.0)
pH: 5.5 (ref 5.0–8.0)

## 2014-06-11 NOTE — Patient Instructions (Signed)
First Trimester of Pregnancy The first trimester of pregnancy is from week 1 until the end of week 12 (months 1 through 3). A week after a sperm fertilizes an egg, the egg will implant on the wall of the uterus. This embryo will begin to develop into a baby. Genes from you and your partner are forming the baby. The female genes determine whether the baby is a boy or a girl. At 6-8 weeks, the eyes and face are formed, and the heartbeat can be seen on ultrasound. At the end of 12 weeks, all the baby's organs are formed.  Now that you are pregnant, you will want to do everything you can to have a healthy baby. Two of the most important things are to get good prenatal care and to follow your health care provider's instructions. Prenatal care is all the medical care you receive before the baby's birth. This care will help prevent, find, and treat any problems during the pregnancy and childbirth. BODY CHANGES Your body goes through many changes during pregnancy. The changes vary from woman to woman.   You may gain or lose a couple of pounds at first.  You may feel sick to your stomach (nauseous) and throw up (vomit). If the vomiting is uncontrollable, call your health care provider.  You may tire easily.  You may develop headaches that can be relieved by medicines approved by your health care provider.  You may urinate more often. Painful urination may mean you have a bladder infection.  You may develop heartburn as a result of your pregnancy.  You may develop constipation because certain hormones are causing the muscles that push waste through your intestines to slow down.  You may develop hemorrhoids or swollen, bulging veins (varicose veins).  Your breasts may begin to grow larger and become tender. Your nipples may stick out more, and the tissue that surrounds them (areola) may become darker.  Your gums may bleed and may be sensitive to brushing and flossing.  Dark spots or blotches (chloasma,  mask of pregnancy) may develop on your face. This will likely fade after the baby is born.  Your menstrual periods will stop.  You may have a loss of appetite.  You may develop cravings for certain kinds of food.  You may have changes in your emotions from day to day, such as being excited to be pregnant or being concerned that something may go wrong with the pregnancy and baby.  You may have more vivid and strange dreams.  You may have changes in your hair. These can include thickening of your hair, rapid growth, and changes in texture. Some women also have hair loss during or after pregnancy, or hair that feels dry or thin. Your hair will most likely return to normal after your baby is born. WHAT TO EXPECT AT YOUR PRENATAL VISITS During a routine prenatal visit:  You will be weighed to make sure you and the baby are growing normally.  Your blood pressure will be taken.  Your abdomen will be measured to track your baby's growth.  The fetal heartbeat will be listened to starting around week 10 or 12 of your pregnancy.  Test results from any previous visits will be discussed. Your health care provider may ask you:  How you are feeling.  If you are feeling the baby move.  If you have had any abnormal symptoms, such as leaking fluid, bleeding, severe headaches, or abdominal cramping.  If you have any questions. Other tests   that may be performed during your first trimester include:  Blood tests to find your blood type and to check for the presence of any previous infections. They will also be used to check for low iron levels (anemia) and Rh antibodies. Later in the pregnancy, blood tests for diabetes will be done along with other tests if problems develop.  Urine tests to check for infections, diabetes, or protein in the urine.  An ultrasound to confirm the proper growth and development of the baby.  An amniocentesis to check for possible genetic problems.  Fetal screens for  spina bifida and Down syndrome.  You may need other tests to make sure you and the baby are doing well. HOME CARE INSTRUCTIONS  Medicines  Follow your health care provider's instructions regarding medicine use. Specific medicines may be either safe or unsafe to take during pregnancy.  Take your prenatal vitamins as directed.  If you develop constipation, try taking a stool softener if your health care provider approves. Diet  Eat regular, well-balanced meals. Choose a variety of foods, such as meat or vegetable-based protein, fish, milk and low-fat dairy products, vegetables, fruits, and whole grain breads and cereals. Your health care provider will help you determine the amount of weight gain that is right for you.  Avoid raw meat and uncooked cheese. These carry germs that can cause birth defects in the baby.  Eating four or five small meals rather than three large meals a day may help relieve nausea and vomiting. If you start to feel nauseous, eating a few soda crackers can be helpful. Drinking liquids between meals instead of during meals also seems to help nausea and vomiting.  If you develop constipation, eat more high-fiber foods, such as fresh vegetables or fruit and whole grains. Drink enough fluids to keep your urine clear or pale yellow. Activity and Exercise  Exercise only as directed by your health care provider. Exercising will help you:  Control your weight.  Stay in shape.  Be prepared for labor and delivery.  Experiencing pain or cramping in the lower abdomen or low back is a good sign that you should stop exercising. Check with your health care provider before continuing normal exercises.  Try to avoid standing for long periods of time. Move your legs often if you must stand in one place for a long time.  Avoid heavy lifting.  Wear low-heeled shoes, and practice good posture.  You may continue to have sex unless your health care provider directs you  otherwise. Relief of Pain or Discomfort  Wear a good support bra for breast tenderness.   Take warm sitz baths to soothe any pain or discomfort caused by hemorrhoids. Use hemorrhoid cream if your health care provider approves.   Rest with your legs elevated if you have leg cramps or low back pain.  If you develop varicose veins in your legs, wear support hose. Elevate your feet for 15 minutes, 3-4 times a day. Limit salt in your diet. Prenatal Care  Schedule your prenatal visits by the twelfth week of pregnancy. They are usually scheduled monthly at first, then more often in the last 2 months before delivery.  Write down your questions. Take them to your prenatal visits.  Keep all your prenatal visits as directed by your health care provider. Safety  Wear your seat belt at all times when driving.  Make a list of emergency phone numbers, including numbers for family, friends, the hospital, and police and fire departments. General Tips    Ask your health care provider for a referral to a local prenatal education class. Begin classes no later than at the beginning of month 6 of your pregnancy.  Ask for help if you have counseling or nutritional needs during pregnancy. Your health care provider can offer advice or refer you to specialists for help with various needs.  Do not use hot tubs, steam rooms, or saunas.  Do not douche or use tampons or scented sanitary pads.  Do not cross your legs for long periods of time.  Avoid cat litter boxes and soil used by cats. These carry germs that can cause birth defects in the baby and possibly loss of the fetus by miscarriage or stillbirth.  Avoid all smoking, herbs, alcohol, and medicines not prescribed by your health care provider. Chemicals in these affect the formation and growth of the baby.  Schedule a dentist appointment. At home, brush your teeth with a soft toothbrush and be gentle when you floss. SEEK MEDICAL CARE IF:   You have  dizziness.  You have mild pelvic cramps, pelvic pressure, or nagging pain in the abdominal area.  You have persistent nausea, vomiting, or diarrhea.  You have a bad smelling vaginal discharge.  You have pain with urination.  You notice increased swelling in your face, hands, legs, or ankles. SEEK IMMEDIATE MEDICAL CARE IF:   You have a fever.  You are leaking fluid from your vagina.  You have spotting or bleeding from your vagina.  You have severe abdominal cramping or pain.  You have rapid weight gain or loss.  You vomit blood or material that looks like coffee grounds.  You are exposed to German measles and have never had them.  You are exposed to fifth disease or chickenpox.  You develop a severe headache.  You have shortness of breath.  You have any kind of trauma, such as from a fall or a car accident. Document Released: 04/26/2001 Document Revised: 09/16/2013 Document Reviewed: 03/12/2013 Exit[employer] Patient Information 2015 [employer]. This information is not intended to replace advice given to you by your health care provider. Make sure you discuss any questions you have with your health care provider.  

## 2014-06-11 NOTE — Progress Notes (Signed)
Subjective:    [name] is a G3P0010 [redacted]w[redacted]d being seen today for her first obstetrical visit.  Her obstetrical history is significant for Sickle Cell Anemia. Patient does intend to breast feed. Pregnancy history fully reviewed.  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  She uses daily oxycodone for this.    She also reports h/o depression/anxiety/trichotillomania but states these are all better now and do not require treatment.  She is not under care of mental health professional.    Danley Danker Vitals:   06/11/14 1014 06/11/14 1022  BP: 115/74   Pulse: 97   Temp: 98.5 F (36.9 C)   Height:  5' 2.5" (1.588 m)  Weight: 163 lb 4.8 oz (74.072 kg)     HISTORY: OB History  Gravida Para Term Preterm AB SAB TAB Ectopic Multiple Living  3    1 1         # Outcome Date GA Lbr Len/2nd Weight Sex Delivery Anes PTL Lv  3 Current           2 SAB 02/20/11             Comments: System Generated. Please review and update pregnancy details.  1 Gravida              Comments: System Generated. Please review and update pregnancy details.    Obstetric Comments  Miscarried in October 2012 at about 7 weeks   Past Medical History  Diagnosis Date  . H/O: 1 miscarriage 03/22/2011    Pt reports 2 miscarriages.  [name] 01/08/2009  . Depression 01/06/2011  . GERD (gastroesophageal reflux disease) 02/17/2011  . Trichotillomania     h/o  . Blood transfusion     "lots"  . Sickle cell anemia with crisis   . Exertional dyspnea     "sometimes"  . Sickle cell anemia   . Headache(784.0)   . Migraines 11/08/11    "@ least twice/month"  . Chronic back pain     "very severe; have knot in my back; from tight muscle; take RX and exercise for it"  . Mood swings 11/08/11    "I go back and forth; real bad"  . Pregnancy   . Blood dyscrasia     SICKLE CELL   Past Surgical History  Procedure Laterality Date  . Cholecystectomy  05/2010  . Dilation and curettage of uterus  02/20/11     S/P miscarriage   Family History  Problem Relation Age of Onset  . Alcoholism    . Depression    . Hypercholesterolemia    . Hypertension    . Migraines    . Diabetes Maternal Grandmother   . Diabetes Paternal Grandmother   . Diabetes Maternal Grandfather   . Sickle cell trait Mother   . Sickle cell trait Father      Exam    Uterus:   gravid to 10 weeks  Pelvic Exam:    Perineum: No Hemorrhoids, Normal Perineum   Vulva: normal   Vagina:  normal mucosa, copious white discharge   pH:    Cervix: no bleeding following Pap and no cervical motion tenderness   Adnexa: normal adnexa   Bony Pelvis: average  System: Breast:  normal appearance, no masses or tenderness   Skin: normal coloration and turgor, no rashes    Neurologic: oriented, normal mood   Extremities: normal strength, tone, and muscle mass   HEENT extra ocular movement intact   Mouth/Teeth mucous membranes moist, pharynx  normal without lesions and dental hygiene good   Neck supple   Cardiovascular: regular rate and rhythm   Respiratory:  appears well, vitals normal, no respiratory distress, acyanotic, normal RR, ear and throat exam is normal, neck free of mass or lymphadenopathy, chest clear, no wheezing, crepitations, rhonchi, normal symmetric air entry   Abdomen: soft, non-tender; bowel sounds normal; no masses,  no organomegaly   Urinary: urethral meatus normal      Assessment:    Pregnancy: G3P0010 Patient Active Problem List   Diagnosis Date Noted  . Supervision of high-risk pregnancy 06/11/2014  . Pharyngitis   . Pregnant 05/03/2014  . Low grade fever 04/29/2014  . Sore throat 04/29/2014  . Pregnancy at early stage 04/29/2014  . Acute bacterial pharyngitis 04/15/2014  . Tonsillar exudate 04/15/2014  . Sickle cell anemia 01/29/2014  . Tobacco dependence 08/30/2013  . Frequent stools 08/27/2013  . Pain, joint, upper arm, right 08/27/2013  . Right groin pain 08/15/2013  . Nausea alone 08/15/2013   . Abscess of right groin 08/15/2013  . Sickle cell crisis 07/31/2013  . Hyperkalemia 07/02/2013  . Foul smelling vaginal discharge 06/19/2013  . OCP (oral contraceptive pills) initiation 06/19/2013  . Unprotected sexual intercourse 06/19/2013  . Leg pain 05/28/2013  . Sickle cell anemia with pain 05/28/2013  . Unspecified constipation 04/25/2013  . 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 Chronic pain 02/26/2013  . Immunization due 02/26/2013  . Protein-calorie malnutrition, severe 02/11/2013  . Streptococcal sore throat 12/31/2012    Class: Acute  . Fever, unspecified 10/06/2012  . Anemia 10/05/2012  . Leukocytosis, unspecified 10/05/2012  . 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 Sickle cell anemia with crisis   . Chronic back pain   . Leukocytosis 03/31/2012  . 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 Sickle cell pain crisis 03/29/2012  . Headache 02/13/2012  . Nausea & vomiting 02/13/2012  . Tachycardia 11/22/2011  . Right thigh pain 11/08/2011  . Mood swings 11/08/2011  . Migraines 11/08/2011  . Vaso-occlusive sickle cell crisis 10/08/2011  . Overweight(278.02) 05/24/2011  . Stress 04/29/2011  . GERD (gastroesophageal reflux disease) 02/17/2011  . Depression 01/06/2011  . Back pain 09/17/2010  . Active smoker 08/09/2010  . 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 Sickle cell disease 01/08/2009  . TRICHOTILLOMANIA 01/08/2009        Plan:    Continue care for Sickle Cell at clinic at [facility]. Initial labs drawn. Prenatal vitamins. Problem list reviewed and updated. Genetic Screening discussed First Screen: ordered.  Ultrasound discussed; fetal survey: requested.  Follow up in 2 weeks in [facility]. 75% of 30 min visit spent on counseling and coordination of care.     Paticia Stack 06/11/2014

## 2014-06-11 NOTE — Progress Notes (Signed)
Here for initial pregnancy visit. Given new pregnancy information. Discussed BMI/ appropriate weight gain.

## 2014-06-12 LAB — OBSTETRIC PANEL
Antibody Screen: NEGATIVE
Basophils Absolute: 0 10*3/uL (ref 0.0–0.1)
Basophils Relative: 0 % (ref 0–1)
Eosinophils Absolute: 0.3 10*3/uL (ref 0.0–0.7)
Eosinophils Relative: 2 % (ref 0–5)
HCT: 26.1 % — ABNORMAL LOW (ref 36.0–46.0)
HEP B S AG: NEGATIVE
Hemoglobin: 8.8 g/dL — ABNORMAL LOW (ref 12.0–15.0)
LYMPHS PCT: 15 % (ref 12–46)
Lymphs Abs: 2.5 10*3/uL (ref 0.7–4.0)
MCH: 29.8 pg (ref 26.0–34.0)
MCHC: 33.7 g/dL (ref 30.0–36.0)
MCV: 88.5 fL (ref 78.0–100.0)
MPV: 9.1 fL (ref 8.6–12.4)
Monocytes Absolute: 1.7 10*3/uL — ABNORMAL HIGH (ref 0.1–1.0)
Monocytes Relative: 10 % (ref 3–12)
Neutro Abs: 12.3 10*3/uL — ABNORMAL HIGH (ref 1.7–7.7)
Neutrophils Relative %: 73 % (ref 43–77)
PLATELETS: 435 10*3/uL — AB (ref 150–400)
RBC: 2.95 MIL/uL — ABNORMAL LOW (ref 3.87–5.11)
RDW: 18.2 % — ABNORMAL HIGH (ref 11.5–15.5)
Rh Type: POSITIVE
Rubella: 0.69 Index (ref <0.90)
WBC: 16.9 10*3/uL — ABNORMAL HIGH (ref 4.0–10.5)

## 2014-06-13 ENCOUNTER — Telehealth: Payer: Self-pay

## 2014-06-13 LAB — HEMOGLOBINOPATHY EVALUATION
HGB S QUANTITAION: 52.2 % — AB
Hemoglobin Other: 0 %
Hgb A2 Quant: 2.6 % (ref 2.2–3.2)
Hgb A: 30 % — ABNORMAL LOW (ref 96.8–97.8)
Hgb F Quant: 15.2 % — ABNORMAL HIGH (ref 0.0–2.0)

## 2014-06-13 LAB — CULTURE, OB URINE: Colony Count: 30000

## 2014-06-13 LAB — CYTOLOGY - PAP

## 2014-06-13 MED ORDER — OXYCODONE HCL 10 MG PO TABS
15.0000 mg | ORAL_TABLET | Freq: Four times a day (QID) | ORAL | Status: DC | PRN
Start: 2014-06-13 — End: 2014-06-18

## 2014-06-13 NOTE — Telephone Encounter (Signed)
Meds ordered this encounter  Medications  . Oxycodone HCl 10 MG TABS    Sig: Take 1.5 tablets (15 mg total) by mouth every 6 (six) hours as needed (moderate pain).    Dispense:  42 tablet    Refill:  0    Order Specific Question:  Supervising Provider    Answer:  Liston Alba A [[id number]]  Reviewed [place] Substance Reporting system prior to reorder Dorena Dew, FNP

## 2014-06-13 NOTE — Telephone Encounter (Signed)
Patient brought in medications to be counted: Methadone 5mg  there was 10 tablets. And No tablets of the oxycodone 10mg . Thanks!

## 2014-06-14 IMAGING — CR DG CHEST 2V
2 series · 2 of 2 positions shown · non-contrast
Comparison: DG CHEST 2 VIEW dated 04/28/2013

CLINICAL DATA: Sickle cell crisis, cough, congestion

EXAM:
CHEST  2 VIEW

[w chest pa]
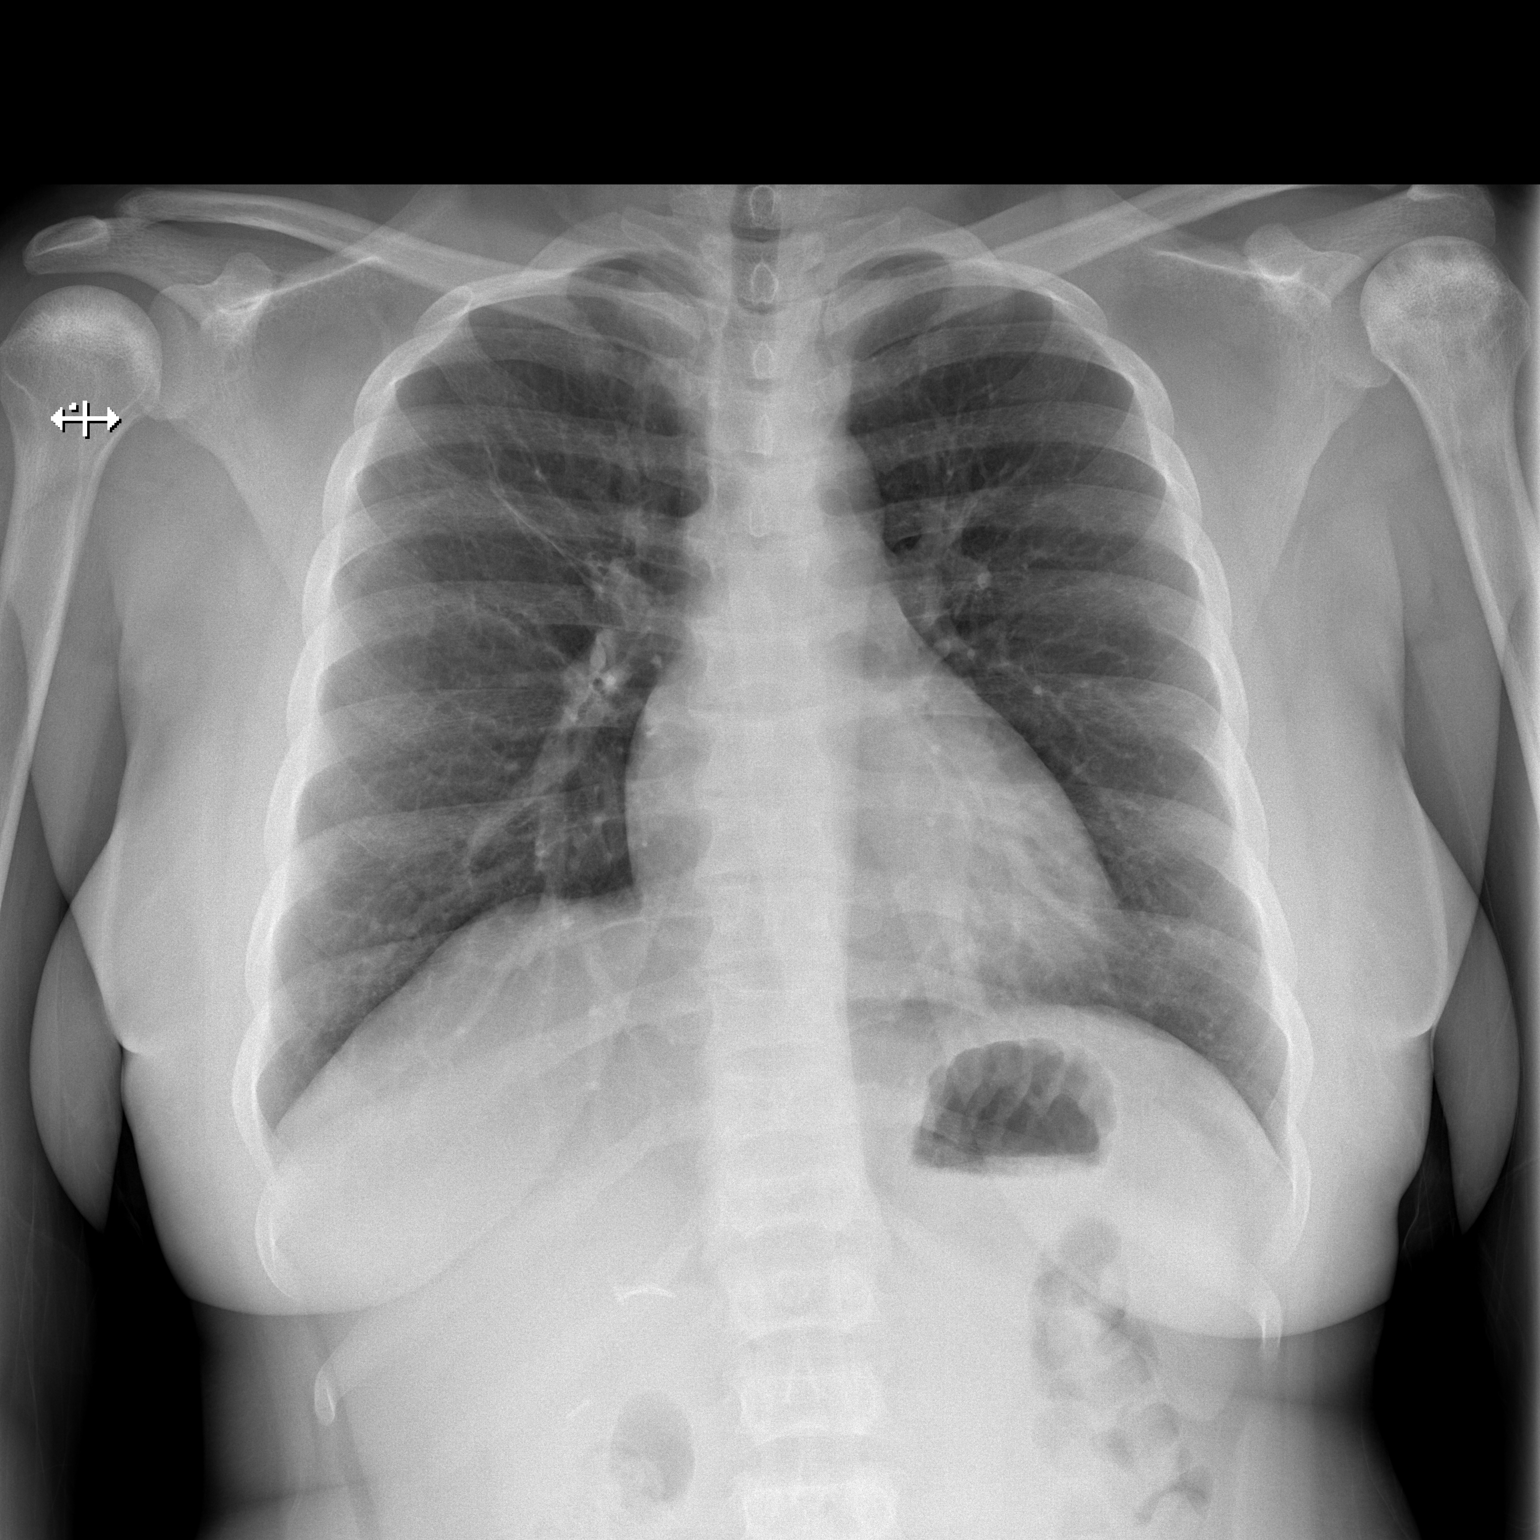

[w chest lat]
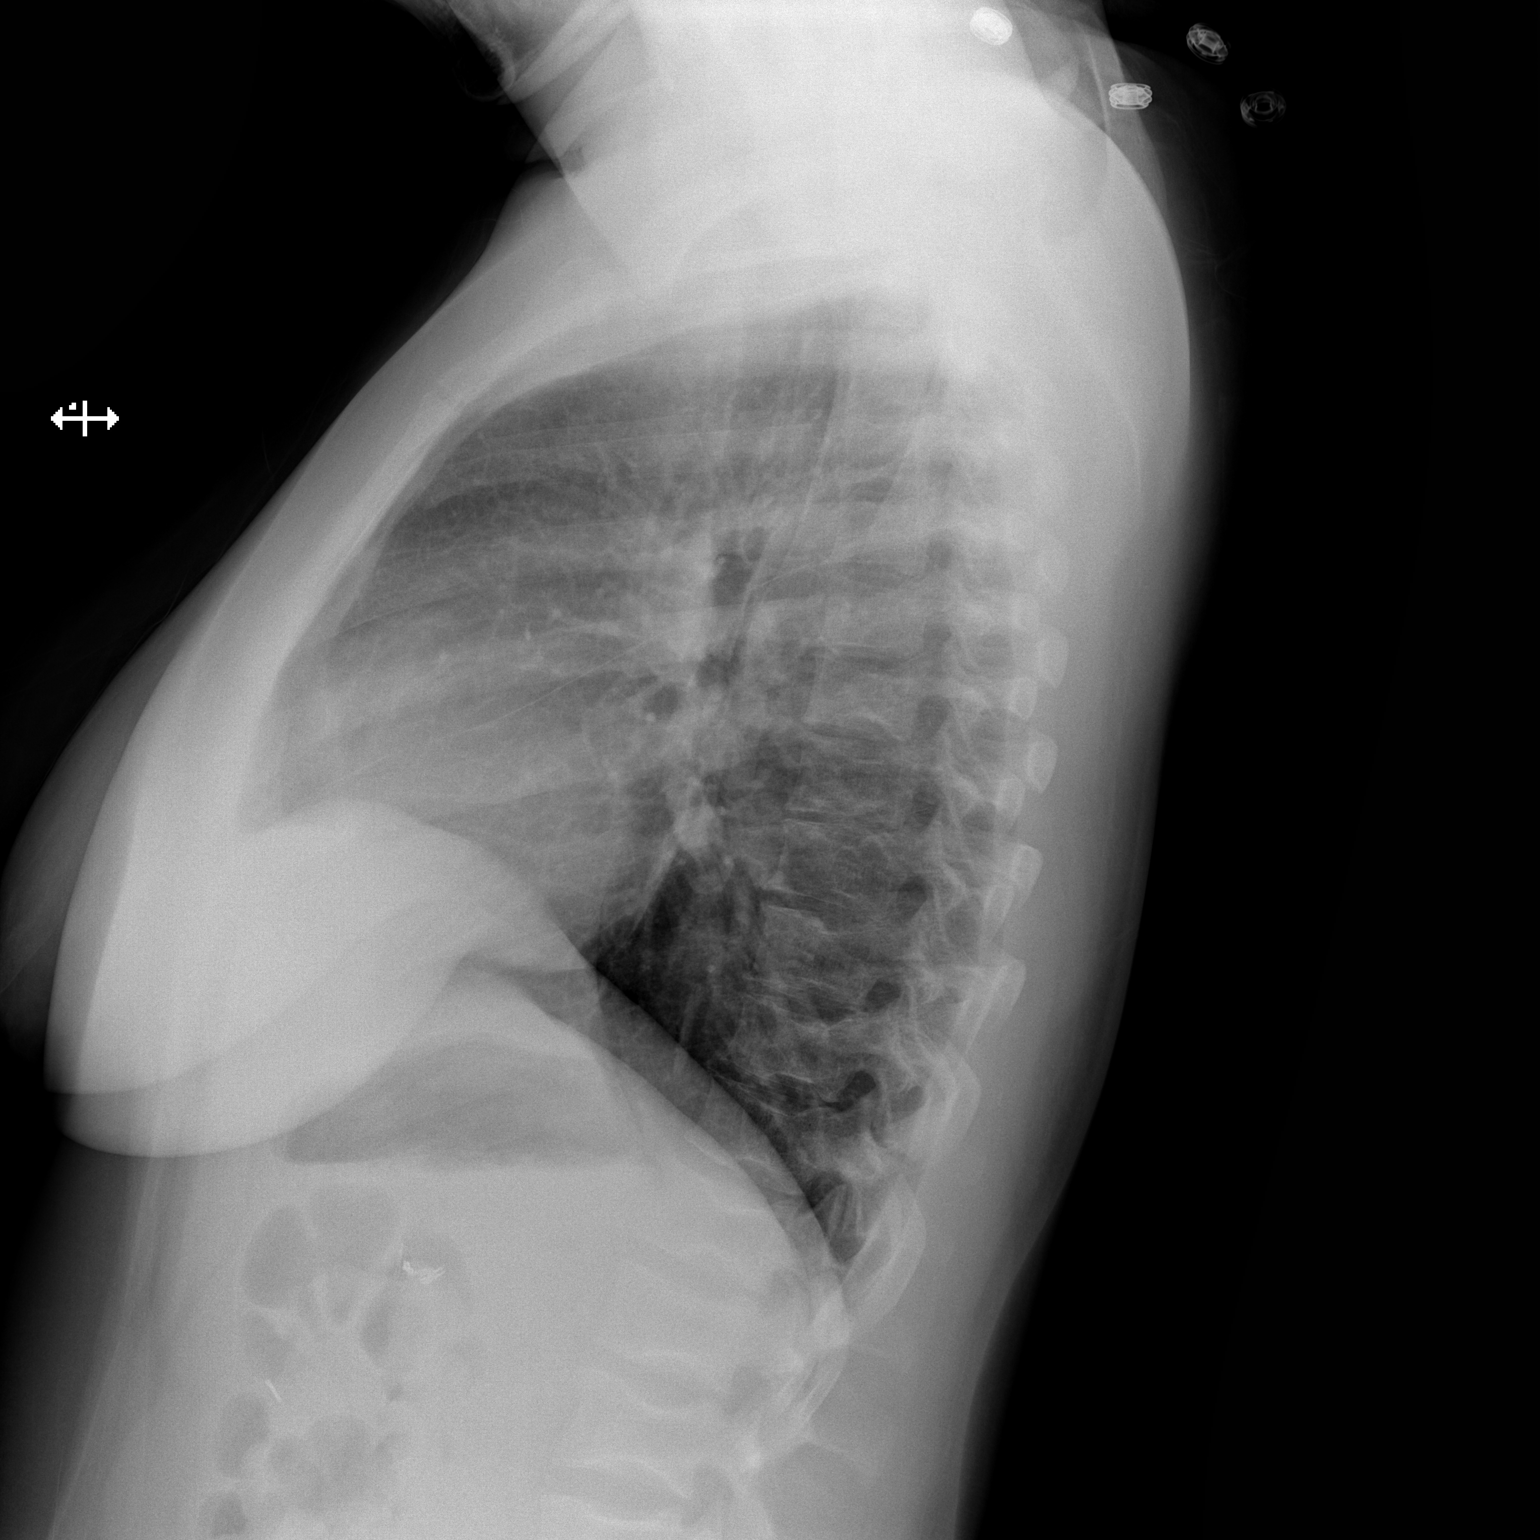

[2 of 2 positions shown; findings below may reference images not displayed]

FINDINGS: Heart size and vascular pattern are normal. There is no
consolidation or effusion. There is a thin band of linear density in
the right upper lobe most consistent with scarring. It is stable.
IMPRESSION: Negative for acute abnormalities.

## 2014-06-15 LAB — CANNABANOIDS (GC/LC/MS), URINE: THC-COOH UR CONFIRM: 69 ng/mL — AB (ref <5)

## 2014-06-15 LAB — OPIATES/OPIOIDS (LC/MS-MS)
Codeine Urine: NEGATIVE ng/mL (ref <50)
Hydrocodone: NEGATIVE ng/mL (ref <50)
Hydromorphone: 65 ng/mL — AB (ref <50)
Morphine Urine: NEGATIVE ng/mL (ref <50)
NORHYDROCODONE, UR: NEGATIVE ng/mL (ref <50)
Noroxycodone, Ur: 713 ng/mL — AB (ref <50)
OXYMORPHONE, URINE: 1451 ng/mL — AB (ref <50)
Oxycodone, ur: 167 ng/mL — AB (ref <50)

## 2014-06-15 LAB — OXYCODONE, URINE (LC/MS-MS)
Noroxycodone, Ur: 713 ng/mL — AB (ref <50)
OXYCODONE, UR: 167 ng/mL — AB (ref <50)
Oxymorphone: 1451 ng/mL — AB (ref <50)

## 2014-06-15 LAB — METHADONE (GC/LC/MS), URINE
EDDPUC: 1051 ng/mL — AB (ref <100)
METHADONEUC: 672 ng/mL — AB (ref <100)

## 2014-06-17 ENCOUNTER — Other Ambulatory Visit (HOSPITAL_COMMUNITY): Payer: Self-pay | Admitting: Maternal and Fetal Medicine

## 2014-06-17 ENCOUNTER — Encounter (HOSPITAL_COMMUNITY): Payer: Self-pay

## 2014-06-17 ENCOUNTER — Ambulatory Visit (HOSPITAL_COMMUNITY)
Admission: RE | Admit: 2014-06-17 | Discharge: 2014-06-17 | Disposition: A | Discharge status: Home / Self Care | Payer: Medicare Other | Source: Ambulatory Visit | Attending: Physician Assistant | Admitting: Physician Assistant

## 2014-06-17 ENCOUNTER — Ambulatory Visit (HOSPITAL_COMMUNITY): Admission: RE | Admit: 2014-06-17 | Payer: Medicare Other | Source: Ambulatory Visit

## 2014-06-17 DIAGNOSIS — O99011 Anemia complicating pregnancy, first trimester: Secondary | ICD-10-CM | POA: Diagnosis not present

## 2014-06-17 DIAGNOSIS — Z36 Encounter for antenatal screening of mother: Secondary | ICD-10-CM | POA: Insufficient documentation

## 2014-06-17 DIAGNOSIS — O99321 Drug use complicating pregnancy, first trimester: Secondary | ICD-10-CM | POA: Diagnosis not present

## 2014-06-17 DIAGNOSIS — D571 Sickle-cell disease without crisis: Secondary | ICD-10-CM | POA: Insufficient documentation

## 2014-06-17 DIAGNOSIS — F1110 Opioid abuse, uncomplicated: Secondary | ICD-10-CM | POA: Diagnosis not present

## 2014-06-17 DIAGNOSIS — IMO0002 Reserved for concepts with insufficient information to code with codable children: Secondary | ICD-10-CM

## 2014-06-17 DIAGNOSIS — Z0489 Encounter for examination and observation for other specified reasons: Secondary | ICD-10-CM

## 2014-06-17 DIAGNOSIS — Z3A12 12 weeks gestation of pregnancy: Secondary | ICD-10-CM | POA: Insufficient documentation

## 2014-06-17 DIAGNOSIS — Z3682 Encounter for antenatal screening for nuchal translucency: Secondary | ICD-10-CM

## 2014-06-17 LAB — PRESCRIPTION MONITORING PROFILE (19 PANEL)
Amphetamine/Meth: NEGATIVE ng/mL
BENZODIAZEPINE SCREEN, URINE: NEGATIVE ng/mL
BUPRENORPHINE, URINE: NEGATIVE ng/mL
Barbiturate Screen, Urine: NEGATIVE ng/mL
CARISOPRODOL, URINE: NEGATIVE ng/mL
Cocaine Metabolites: NEGATIVE ng/mL
Creatinine, Urine: 98.02 mg/dL (ref >20.0)
Fentanyl, Ur: NEGATIVE ng/mL
MDMA URINE: NEGATIVE ng/mL
MEPERIDINE UR: NEGATIVE ng/mL
METHAQUALONE SCREEN (URINE): NEGATIVE ng/mL
Nitrites, Initial: NEGATIVE ug/mL
Phencyclidine, Ur: NEGATIVE ng/mL
Propoxyphene: NEGATIVE ng/mL
Tapentadol, urine: NEGATIVE ng/mL
Tramadol Scrn, Ur: NEGATIVE ng/mL
Zolpidem, Urine: NEGATIVE ng/mL
pH, Initial: 5.5 pH (ref 4.5–8.9)

## 2014-06-18 ENCOUNTER — Other Ambulatory Visit: Payer: Self-pay

## 2014-06-18 DIAGNOSIS — D571 Sickle-cell disease without crisis: Secondary | ICD-10-CM

## 2014-06-18 DIAGNOSIS — G8929 Other chronic pain: Secondary | ICD-10-CM

## 2014-06-18 NOTE — Telephone Encounter (Signed)
Refill request for Methadone 5mg  and Oxycodone 10mg . LOV 04/29/2014. Please advise. Thanks!

## 2014-06-19 MED ORDER — OXYCODONE HCL 10 MG PO TABS: 15.0000 mg | ORAL_TABLET | Freq: Four times a day (QID) | ORAL | Status: DC | PRN

## 2014-06-19 MED ORDER — METHADONE HCL 5 MG PO TABS: 5.0000 mg | ORAL_TABLET | Freq: Every day | ORAL | Status: DC

## 2014-06-24 ENCOUNTER — Ambulatory Visit (INDEPENDENT_AMBULATORY_CARE_PROVIDER_SITE_OTHER): Payer: Medicare Other | Admitting: Obstetrics and Gynecology

## 2014-06-24 ENCOUNTER — Encounter: Payer: Self-pay | Admitting: Obstetrics and Gynecology

## 2014-06-24 VITALS — BP 115/58 | HR 103 | Temp 98.3°F | Wt 166.0 lb

## 2014-06-24 DIAGNOSIS — D571 Sickle-cell disease without crisis: Secondary | ICD-10-CM

## 2014-06-24 DIAGNOSIS — O0992 Supervision of high risk pregnancy, unspecified, second trimester: Secondary | ICD-10-CM

## 2014-06-24 LAB — POCT URINALYSIS DIP (DEVICE)
BILIRUBIN URINE: NEGATIVE
GLUCOSE, UA: NEGATIVE mg/dL
Hgb urine dipstick: NEGATIVE
KETONES UR: NEGATIVE mg/dL
Leukocytes, UA: NEGATIVE
Nitrite: NEGATIVE
PROTEIN: NEGATIVE mg/dL
SPECIFIC GRAVITY, URINE: 1.015 (ref 1.005–1.030)
UROBILINOGEN UA: 0.2 mg/dL (ref 0.0–1.0)
pH: 5.5 (ref 5.0–8.0)

## 2014-06-24 NOTE — Progress Notes (Signed)
Patient is doing well without complaints. First trimester screen results reviewed. AFP next visit. Anatomy ultrasound scheduled for 3/15.

## 2014-06-24 NOTE — Progress Notes (Signed)
Pt reports having migraine headache since last Thursday, Tylenol does not relieve pain.

## 2014-06-25 ENCOUNTER — Encounter (HOSPITAL_COMMUNITY): Payer: Self-pay | Admitting: Emergency Medicine

## 2014-06-25 ENCOUNTER — Inpatient Hospital Stay (HOSPITAL_COMMUNITY)
Admission: EM | Admit: 2014-06-25 | Discharge: 2014-06-28 | DRG: 812 | Disposition: A | Discharge status: Home / Self Care | Payer: Medicare Other | Attending: Internal Medicine | Admitting: Internal Medicine

## 2014-06-25 DIAGNOSIS — F329 Major depressive disorder, single episode, unspecified: Secondary | ICD-10-CM | POA: Diagnosis present

## 2014-06-25 DIAGNOSIS — Z87891 Personal history of nicotine dependence: Secondary | ICD-10-CM

## 2014-06-25 DIAGNOSIS — Z91040 Latex allergy status: Secondary | ICD-10-CM | POA: Diagnosis not present

## 2014-06-25 DIAGNOSIS — G8929 Other chronic pain: Secondary | ICD-10-CM

## 2014-06-25 DIAGNOSIS — Z79891 Long term (current) use of opiate analgesic: Secondary | ICD-10-CM

## 2014-06-25 DIAGNOSIS — O9989 Other specified diseases and conditions complicating pregnancy, childbirth and the puerperium: Secondary | ICD-10-CM | POA: Diagnosis not present

## 2014-06-25 DIAGNOSIS — D571 Sickle-cell disease without crisis: Secondary | ICD-10-CM | POA: Diagnosis present

## 2014-06-25 DIAGNOSIS — D5700 Hb-SS disease with crisis, unspecified: Secondary | ICD-10-CM

## 2014-06-25 DIAGNOSIS — G43909 Migraine, unspecified, not intractable, without status migrainosus: Secondary | ICD-10-CM | POA: Diagnosis present

## 2014-06-25 DIAGNOSIS — K219 Gastro-esophageal reflux disease without esophagitis: Secondary | ICD-10-CM | POA: Diagnosis present

## 2014-06-25 DIAGNOSIS — Z3A10 10 weeks gestation of pregnancy: Secondary | ICD-10-CM | POA: Diagnosis not present

## 2014-06-25 DIAGNOSIS — M549 Dorsalgia, unspecified: Secondary | ICD-10-CM | POA: Diagnosis present

## 2014-06-25 LAB — CBC WITH DIFFERENTIAL/PLATELET
BASOS ABS: 0 10*3/uL (ref 0.0–0.1)
BASOS PCT: 0 % (ref 0–1)
EOS PCT: 1 % (ref 0–5)
Eosinophils Absolute: 0.2 10*3/uL (ref 0.0–0.7)
HEMATOCRIT: 25.6 % — AB (ref 36.0–46.0)
Hemoglobin: 8.6 g/dL — ABNORMAL LOW (ref 12.0–15.0)
Lymphocytes Relative: 16 % (ref 12–46)
Lymphs Abs: 2.5 10*3/uL (ref 0.7–4.0)
MCH: 30.1 pg (ref 26.0–34.0)
MCHC: 33.6 g/dL (ref 30.0–36.0)
MCV: 89.5 fL (ref 78.0–100.0)
MONO ABS: 1.1 10*3/uL — AB (ref 0.1–1.0)
Monocytes Relative: 7 % (ref 3–12)
Neutro Abs: 12 10*3/uL — ABNORMAL HIGH (ref 1.7–7.7)
Neutrophils Relative %: 76 % (ref 43–77)
Platelets: 483 10*3/uL — ABNORMAL HIGH (ref 150–400)
RBC: 2.86 MIL/uL — ABNORMAL LOW (ref 3.87–5.11)
RDW: 17.3 % — AB (ref 11.5–15.5)
WBC: 15.8 10*3/uL — ABNORMAL HIGH (ref 4.0–10.5)

## 2014-06-25 LAB — RETICULOCYTES
RBC.: 2.86 MIL/uL — ABNORMAL LOW (ref 3.87–5.11)
RETIC CT PCT: 17.4 % — AB (ref 0.4–3.1)
Retic Count, Absolute: 497.6 10*3/uL — ABNORMAL HIGH (ref 19.0–186.0)

## 2014-06-25 LAB — URINALYSIS, ROUTINE W REFLEX MICROSCOPIC
Bilirubin Urine: NEGATIVE
GLUCOSE, UA: NEGATIVE mg/dL
Hgb urine dipstick: NEGATIVE
Ketones, ur: NEGATIVE mg/dL
Leukocytes, UA: NEGATIVE
NITRITE: NEGATIVE
PROTEIN: NEGATIVE mg/dL
Specific Gravity, Urine: 1.012 (ref 1.005–1.030)
UROBILINOGEN UA: 1 mg/dL (ref 0.0–1.0)
pH: 7 (ref 5.0–8.0)

## 2014-06-25 LAB — COMPREHENSIVE METABOLIC PANEL
ALT: 23 U/L (ref 0–35)
AST: 26 U/L (ref 0–37)
Albumin: 4.2 g/dL (ref 3.5–5.2)
Alkaline Phosphatase: 49 U/L (ref 39–117)
Anion gap: 7 (ref 5–15)
BUN: 7 mg/dL (ref 6–23)
CHLORIDE: 107 mmol/L (ref 96–112)
CO2: 22 mmol/L (ref 19–32)
Calcium: 9.3 mg/dL (ref 8.4–10.5)
Creatinine, Ser: 0.47 mg/dL — ABNORMAL LOW (ref 0.50–1.10)
GFR calc Af Amer: >90 mL/min (ref >90)
GLUCOSE: 88 mg/dL (ref 70–99)
Potassium: 4 mmol/L (ref 3.5–5.1)
SODIUM: 136 mmol/L (ref 135–145)
Total Bilirubin: 1.8 mg/dL — ABNORMAL HIGH (ref 0.3–1.2)
Total Protein: 7.6 g/dL (ref 6.0–8.3)

## 2014-06-25 MED ORDER — DIPHENHYDRAMINE HCL 25 MG PO CAPS: 25.0000 mg | ORAL_CAPSULE | Freq: Four times a day (QID) | ORAL | Status: DC | PRN

## 2014-06-25 MED ORDER — SENNOSIDES-DOCUSATE SODIUM 8.6-50 MG PO TABS
1.0000 | ORAL_TABLET | Freq: Two times a day (BID) | ORAL | Status: DC
  Administered 2014-06-25: 1 via ORAL
  Filled 2014-06-25 (×8): qty 1

## 2014-06-25 MED ORDER — ENOXAPARIN SODIUM 40 MG/0.4ML ~~LOC~~ SOLN
40.0000 mg | SUBCUTANEOUS | Status: DC
  Administered 2014-06-27: 40 mg via SUBCUTANEOUS
  Filled 2014-06-25 (×4): qty 0.4

## 2014-06-25 MED ORDER — PRENATAL MULTIVITAMIN CH
1.0000 | ORAL_TABLET | Freq: Every day | ORAL | Status: DC
  Administered 2014-06-26 – 2014-06-28 (×3): 1 via ORAL
  Filled 2014-06-25 (×3): qty 1

## 2014-06-25 MED ORDER — FOLIC ACID 1 MG PO TABS
2.0000 mg | ORAL_TABLET | Freq: Every day | ORAL | Status: DC
  Administered 2014-06-26 – 2014-06-28 (×3): 2 mg via ORAL
  Filled 2014-06-25 (×3): qty 2

## 2014-06-25 MED ORDER — DEXTROSE-NACL 5-0.45 % IV SOLN
INTRAVENOUS | Status: DC
  Administered 2014-06-25 – 2014-06-26 (×2): via INTRAVENOUS
  Administered 2014-06-26 – 2014-06-27 (×2): 1000 mL via INTRAVENOUS

## 2014-06-25 MED ORDER — ONDANSETRON HCL 4 MG/2ML IJ SOLN: 4.0000 mg | Freq: Four times a day (QID) | INTRAMUSCULAR | Status: DC | PRN

## 2014-06-25 MED ORDER — HYDROMORPHONE HCL 2 MG/ML IJ SOLN
2.3000 mg | Freq: Once | INTRAMUSCULAR | Status: AC
  Administered 2014-06-25: 2.3 mg via INTRAVENOUS
  Filled 2014-06-25: qty 2

## 2014-06-25 MED ORDER — POLYETHYLENE GLYCOL 3350 17 G PO PACK: 17.0000 g | PACK | Freq: Every day | ORAL | Status: DC | PRN

## 2014-06-25 MED ORDER — SODIUM CHLORIDE 0.9 % IJ SOLN: 9.0000 mL | INTRAMUSCULAR | Status: DC | PRN

## 2014-06-25 MED ORDER — METHADONE HCL 5 MG PO TABS
5.0000 mg | ORAL_TABLET | Freq: Every day | ORAL | Status: DC
  Administered 2014-06-25 – 2014-06-27 (×3): 5 mg via ORAL
  Filled 2014-06-25 (×3): qty 1

## 2014-06-25 MED ORDER — SODIUM CHLORIDE 0.9 % IV SOLN
25.0000 mg | INTRAVENOUS | Status: DC | PRN
  Filled 2014-06-25: qty 0.5

## 2014-06-25 MED ORDER — SODIUM CHLORIDE 0.9 % IV BOLUS (SEPSIS)
1000.0000 mL | Freq: Once | INTRAVENOUS | Status: AC
  Administered 2014-06-25: 1000 mL via INTRAVENOUS

## 2014-06-25 MED ORDER — HYDROMORPHONE 2 MG/ML HIGH CONCENTRATION IV PCA SOLN
INTRAVENOUS | Status: DC
  Administered 2014-06-25: 17:00:00 via INTRAVENOUS
  Filled 2014-06-25: qty 25

## 2014-06-25 MED ORDER — HYDROMORPHONE 2 MG/ML HIGH CONCENTRATION IV PCA SOLN
INTRAVENOUS | Status: DC
  Administered 2014-06-25: 7 mg via INTRAVENOUS
  Administered 2014-06-25: 4.51 mg via INTRAVENOUS
  Administered 2014-06-26: 5.6 mg via INTRAVENOUS
  Administered 2014-06-26: 4.8 mg via INTRAVENOUS
  Administered 2014-06-26: 5.83 mg via INTRAVENOUS
  Administered 2014-06-26: 10.5 mg via INTRAVENOUS
  Administered 2014-06-26: 2.1 mg via INTRAVENOUS
  Administered 2014-06-26: 7.38 mg via INTRAVENOUS
  Administered 2014-06-27: 3.5 mg via INTRAVENOUS
  Administered 2014-06-27: 4.2 mg via INTRAVENOUS
  Administered 2014-06-27: 3.5 mg via INTRAVENOUS
  Filled 2014-06-25 (×2): qty 25

## 2014-06-25 MED ORDER — HYDROMORPHONE HCL 1 MG/ML IJ SOLN
1.0000 mg | INTRAMUSCULAR | Status: AC | PRN
  Administered 2014-06-25 (×2): 1 mg via INTRAVENOUS
  Filled 2014-06-25 (×2): qty 1

## 2014-06-25 MED ORDER — NALOXONE HCL 0.4 MG/ML IJ SOLN: 0.4000 mg | INTRAMUSCULAR | Status: DC | PRN

## 2014-06-25 MED ORDER — HYDROMORPHONE HCL 2 MG/ML IJ SOLN
2.0000 mg | INTRAMUSCULAR | Status: DC | PRN
  Administered 2014-06-25: 2 mg via INTRAVENOUS
  Filled 2014-06-25: qty 1

## 2014-06-25 MED ORDER — SODIUM CHLORIDE 0.9 % IV SOLN: Freq: Once | INTRAVENOUS | Status: DC

## 2014-06-25 MED ORDER — CYCLOBENZAPRINE HCL 10 MG PO TABS
10.0000 mg | ORAL_TABLET | Freq: Once | ORAL | Status: AC
  Administered 2014-06-25: 10 mg via ORAL
  Filled 2014-06-25: qty 1

## 2014-06-25 NOTE — ED Notes (Signed)
Pt states that she has been having SCC crisis with headache and rt shoulder pain since Thursday.

## 2014-06-25 NOTE — ED Notes (Signed)
Report given to [place], RN on [address]--will transport shortly.

## 2014-06-25 NOTE — ED Notes (Signed)
Patient is unable to urinate at this time she will call when she can go

## 2014-06-26 DIAGNOSIS — D5700 Hb-SS disease with crisis, unspecified: Principal | ICD-10-CM

## 2014-06-26 MED ORDER — HYDROMORPHONE HCL 2 MG/ML IJ SOLN
2.0000 mg | INTRAMUSCULAR | Status: DC | PRN
  Administered 2014-06-26 – 2014-06-27 (×8): 2 mg via INTRAVENOUS
  Filled 2014-06-26 (×8): qty 1

## 2014-06-26 NOTE — Progress Notes (Signed)
Subjective: A 24 year old female who is [redacted] weeks pregnant admitted with sickle cell painful crisis. Patient has been doing a little better. Her pain is currently a 7 out of 10. She is on Dilaudid PCA and methadone. Due to her pregnancy she is restricted in terms of full range of medications. She used 26.77 mg with 29 demands and 29 deliveries in the last 24 hours. Pain is mainly in her upper arms and lower legs. Denied any shortness of breath or cough no nausea vomiting or diarrhea.  Objective: Vital signs in last 24 hours: Temp:  [97.6 F (36.4 C)-99 F (37.2 C)] 98.6 F (37 C) (02/11 1019) Pulse Rate:  [78-107] 107 (02/11 1019) Resp:  [12-18] 16 (02/11 1019) BP: (93-128)/(42-82) 121/82 mmHg (02/11 1019) SpO2:  [95 %-100 %] 100 % (02/11 1019) FiO2 (%):  [21 %] 21 % (02/11 0441) Weight:  [73.936 kg (163 lb)] 73.936 kg (163 lb) (02/11 0441) Weight change:  Last BM Date: 06/23/14  Intake/Output from previous day: 02/10 0701 - 02/11 0700 In: 1800.8 [P.O.:480; I.V.:1320.8] Out: 0  Intake/Output this shift: Total I/O In: 240 [P.O.:240] Out: -   General appearance: alert, cooperative and no distress Head: Normocephalic, without obvious abnormality, atraumatic Eyes: conjunctivae/corneas clear. PERRL, EOM's intact. Fundi benign. Neck: no adenopathy, no carotid bruit, no JVD, supple, symmetrical, trachea midline and thyroid not enlarged, symmetric, no tenderness/mass/nodules Back: symmetric, no curvature. ROM normal. No CVA tenderness. Resp: clear to auscultation bilaterally Chest wall: no tenderness GI: soft, non-tender; bowel sounds normal; no masses,  no organomegaly Extremities: extremities normal, atraumatic, no cyanosis or edema Pulses: 2+ and symmetric Skin: Skin color, texture, turgor normal. No rashes or lesions Neurologic: Grossly normal  Lab Results:  Recent Labs  06/25/14 1330  WBC 15.8*  HGB 8.6*  HCT 25.6*  PLT 483*   BMET  Recent Labs  06/25/14 1330  NA  136  K 4.0  CL 107  CO2 22  GLUCOSE 88  BUN 7  CREATININE 0.47*  CALCIUM 9.3    Studies/Results: No results found.  Medications: I have reviewed the patient's current medications.  Assessment/Plan: A 24 year old female admitted with sickle cell painful crisis.  #1 sickle cell painful crisis: Patient is not getting adequate pain control. I will add physician assisted dosing of 2 mg every 2 hours of Dilaudid. Continue hydration.  #2 sickle cell anemia: Hemoglobin seems stable. Continue to monitor.  #3 leukocytosis: Most likely secondary sickle cell disease. Continue to monitor.  #4 second trimester pregnancy: Patient is being followed by obstetricians. So far pregnancy seems to be going well. She will resume her care after discharge.  LOS: 1 day   GARBA,LAWAL 06/26/2014, 10:56 AM

## 2014-06-27 ENCOUNTER — Ambulatory Visit: Payer: Medicare Other | Admitting: Family Medicine

## 2014-06-27 LAB — CBC WITH DIFFERENTIAL/PLATELET
Basophils Absolute: 0 10*3/uL (ref 0.0–0.1)
Basophils Relative: 0 % (ref 0–1)
EOS ABS: 0.4 10*3/uL (ref 0.0–0.7)
EOS PCT: 3 % (ref 0–5)
HEMATOCRIT: 23.6 % — AB (ref 36.0–46.0)
HEMOGLOBIN: 7.9 g/dL — AB (ref 12.0–15.0)
LYMPHS ABS: 2.8 10*3/uL (ref 0.7–4.0)
Lymphocytes Relative: 20 % (ref 12–46)
MCH: 30 pg (ref 26.0–34.0)
MCHC: 33.5 g/dL (ref 30.0–36.0)
MCV: 89.7 fL (ref 78.0–100.0)
MONO ABS: 1.3 10*3/uL — AB (ref 0.1–1.0)
MONOS PCT: 9 % (ref 3–12)
NEUTROS PCT: 68 % (ref 43–77)
Neutro Abs: 9.4 10*3/uL — ABNORMAL HIGH (ref 1.7–7.7)
Platelets: 446 10*3/uL — ABNORMAL HIGH (ref 150–400)
RBC: 2.63 MIL/uL — ABNORMAL LOW (ref 3.87–5.11)
RDW: 16.8 % — ABNORMAL HIGH (ref 11.5–15.5)
WBC: 13.8 10*3/uL — AB (ref 4.0–10.5)

## 2014-06-27 LAB — URINE CULTURE

## 2014-06-27 LAB — COMPREHENSIVE METABOLIC PANEL
ALK PHOS: 44 U/L (ref 39–117)
ALT: 19 U/L (ref 0–35)
AST: 22 U/L (ref 0–37)
Albumin: 3.6 g/dL (ref 3.5–5.2)
Anion gap: 6 (ref 5–15)
BUN: 6 mg/dL (ref 6–23)
CO2: 22 mmol/L (ref 19–32)
Calcium: 8.6 mg/dL (ref 8.4–10.5)
Chloride: 106 mmol/L (ref 96–112)
Creatinine, Ser: 0.44 mg/dL — ABNORMAL LOW (ref 0.50–1.10)
GLUCOSE: 104 mg/dL — AB (ref 70–99)
Potassium: 3.8 mmol/L (ref 3.5–5.1)
SODIUM: 134 mmol/L — AB (ref 135–145)
TOTAL PROTEIN: 6.6 g/dL (ref 6.0–8.3)
Total Bilirubin: 1.6 mg/dL — ABNORMAL HIGH (ref 0.3–1.2)

## 2014-06-27 LAB — LACTATE DEHYDROGENASE: LDH: 231 U/L (ref 94–250)

## 2014-06-27 MED ORDER — HYDROMORPHONE 2 MG/ML HIGH CONCENTRATION IV PCA SOLN
INTRAVENOUS | Status: DC
  Administered 2014-06-27: 3.5 mg via INTRAVENOUS
  Administered 2014-06-27: 4.53 mg via INTRAVENOUS
  Administered 2014-06-28: 3 mg via INTRAVENOUS
  Administered 2014-06-28: 1 mg via INTRAVENOUS

## 2014-06-27 MED ORDER — OXYCODONE HCL 5 MG PO TABS: 10.0000 mg | ORAL_TABLET | Freq: Four times a day (QID) | ORAL | Status: DC

## 2014-06-27 MED ORDER — OXYCODONE HCL 5 MG PO TABS
10.0000 mg | ORAL_TABLET | ORAL | Status: DC
  Administered 2014-06-27 – 2014-06-28 (×7): 10 mg via ORAL
  Filled 2014-06-27 (×7): qty 2

## 2014-06-27 MED ORDER — HYDROMORPHONE HCL 2 MG/ML IJ SOLN
2.0000 mg | Freq: Once | INTRAMUSCULAR | Status: AC
  Administered 2014-06-27: 2 mg via INTRAVENOUS
  Filled 2014-06-27: qty 1

## 2014-06-27 MED ORDER — HYDROMORPHONE 2 MG/ML HIGH CONCENTRATION IV PCA SOLN: INTRAVENOUS | Status: DC

## 2014-06-27 NOTE — Plan of Care (Signed)
Problem: Phase I Progression Outcomes Goal: Pain controlled with appropriate interventions Outcome: Progressing Rating pain 5/10 in upper back. On PCA and receiving q3h prn dilaudid as well.

## 2014-06-27 NOTE — Progress Notes (Signed)
Patient ID: [name], female   DOB: 05/31/1990, 24 y.o.   MRN: [id number] SICKLE CELL SERVICE PROGRESS NOTE  [name] GQB:[id number] DOB: 1990/12/22 DOA: 06/25/2014 PCP: MATTHEWS,MICHELLE A., MD    Consultants:  none  Procedures:  none  Antibiotics:  none  HPI/Subjective: Pt states that her pain is better. She is having pain in her lower back that she rates as a 5/10. She reports eating and drinking well. She would like to go home today. She is about [redacted] weeks pregnant.  Objective: Filed Vitals:   06/27/14 0804 06/27/14 1140 06/27/14 1220 06/27/14 1313  BP:  115/58  95/51  Pulse:  100  100  Temp:  98.9 F (37.2 C)  98.4 F (36.9 C)  TempSrc:  Oral  Oral  Resp: 12 14 11 12   Height:      Weight:      SpO2:  98% 98% 99%   Weight change: 8 lb 6.4 oz (3.81 kg)  Intake/Output Summary (Last 24 hours) at 06/27/14 1422 Last data filed at 06/27/14 1140  Gross per 24 hour  Intake 3203.18 ml  Output   3200 ml  Net   3.18 ml    General: Alert, awake, oriented x3, in no acute distress.  HEENT: [place]/AT PEERL, EOMI Neck: Trachea midline,  no masses, no thyromegal,y no JVD, no carotid bruit OROPHARYNX:  Moist, no erythema or oropharyngeal lesions Heart: Regular rate and rhythm, without murmurs, rubs, gallops. Lungs: Clear to auscultation Abdomen: Soft, nontender, +gravid, positive bowel sounds, no masses no hepatosplenomegaly noted.  Neuro: No focal neurological deficits noted cranial nerves II through XII grossly intact. Normal gait. Musculoskeletal: No warm swelling or erythema around joints, no spinal tenderness noted.  Extremities: no cyanosis or edema  Data Reviewed: Basic Metabolic Panel:  Recent Labs Lab 06/25/14 1330 06/27/14 0426  NA 136 134*  K 4.0 3.8  CL 107 106  CO2 22 22  GLUCOSE 88 104*  BUN 7 6  CREATININE 0.47* 0.44*  CALCIUM 9.3 8.6   Liver Function Tests:  Recent Labs Lab 06/25/14 1330 06/27/14 0426  AST 26 22  ALT 23 19   ALKPHOS 49 44  BILITOT 1.8* 1.6*  PROT 7.6 6.6  ALBUMIN 4.2 3.6   CBC:  Recent Labs Lab 06/25/14 1330 06/27/14 0426  WBC 15.8* 13.8*  NEUTROABS 12.0* 9.4*  HGB 8.6* 7.9*  HCT 25.6* 23.6*  MCV 89.5 89.7  PLT 483* 446*     Scheduled Meds: . enoxaparin (LOVENOX) injection  40 mg Subcutaneous Q24H  . folic acid  2 mg Oral Daily  . HYDROmorphone PCA 2 mg/mL   Intravenous 6 times per day  . methadone  5 mg Oral QHS  . oxyCODONE  10 mg Oral Q4H  . prenatal multivitamin  1 tablet Oral Daily  . senna-docusate  1 tablet Oral BID   Continuous Infusions:    Active Problems:   Hb-SS disease with crisis   Sickle cell anemia   Assessment/Plan: Active Problems:   Hb-SS disease with crisis   Sickle cell anemia  1. Sickle Cell Crisis: Pt having back pain consistent with her crisis. She reports that her pain has improved today. Pt reports that she will like to go home soon, but was receiving IV push Dilaudid. Will d/c Dilaudid injections and will decrease her Dilaudid PCA to 0.4 mg q 10 min. Continue Methadone for long acting control. Start her home oxycodone 10mg  q4h. If she does well will consider discharge home tomorrow.  Avoid Toradol due to pregnancy. 2. Sickle Cell Care: Continue Folic acid. Avoid Hydrea due to pregnancy. 3. Anemia: Hgb has gone from 8.6 to 7.9 in the past day. Her LDH is low. Monitor. Will transfuse if hgb<7 or symptomatic. 4. Pregnancy: Avoid teratogens, continue prenatal vitamin.   5. FEN/GI :  Regular diet Discontinue IV fluids-now KVO  Bowel regimen in place  Code Status: full DVT Prophylaxis: enoxaparin-pt is refusing injections. Benefits and risk were explained. She is ambulatory but was asked not to deny Lovenox. Will have SCDs placed as well. Family Communication: none Disposition Plan: pending ability to manage pain off IV  Kalman Shan  Pager [phone]. If 7PM-7AM, please contact night-coverage.  06/27/2014, 2:22 PM  LOS: 2 days    Kalman Shan

## 2014-06-27 NOTE — Progress Notes (Signed)
24 hour totals of PCA Total 26.3 mg Total demands 35  Total delivered 35

## 2014-06-27 NOTE — Progress Notes (Signed)
Patient refused lovenox. 

## 2014-06-27 NOTE — Care Management Note (Signed)
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   (If response is "NO", the following Medicare IM given date fields will be blank) Date Medicare IM given:   Medicare IM given by:   Date Additional Medicare IM given:   Additional Medicare IM given by:    Discharge Disposition:    Per UR Regulation:  Reviewed for med. necessity/level of care/duration of stay  If discussed at [place] of Stay Meetings, dates discussed:    Comments:  06/27/14 Marney Doctor RN,BSN,NCM [phone] Pt is followed at the [facility].  No CM needs identified at this time.

## 2014-06-28 LAB — CBC WITH DIFFERENTIAL/PLATELET
Basophils Absolute: 0 10*3/uL (ref 0.0–0.1)
Basophils Relative: 0 % (ref 0–1)
Eosinophils Absolute: 0.6 10*3/uL (ref 0.0–0.7)
Eosinophils Relative: 4 % (ref 0–5)
HCT: 25.1 % — ABNORMAL LOW (ref 36.0–46.0)
Hemoglobin: 8.5 g/dL — ABNORMAL LOW (ref 12.0–15.0)
LYMPHS PCT: 19 % (ref 12–46)
Lymphs Abs: 2.6 10*3/uL (ref 0.7–4.0)
MCH: 30.4 pg (ref 26.0–34.0)
MCHC: 33.9 g/dL (ref 30.0–36.0)
MCV: 89.6 fL (ref 78.0–100.0)
MONO ABS: 1.4 10*3/uL — AB (ref 0.1–1.0)
Monocytes Relative: 10 % (ref 3–12)
NEUTROS ABS: 9.2 10*3/uL — AB (ref 1.7–7.7)
Neutrophils Relative %: 67 % (ref 43–77)
Platelets: 502 10*3/uL — ABNORMAL HIGH (ref 150–400)
RBC: 2.8 MIL/uL — ABNORMAL LOW (ref 3.87–5.11)
RDW: 17.3 % — AB (ref 11.5–15.5)
WBC: 13.8 10*3/uL — AB (ref 4.0–10.5)

## 2014-06-28 LAB — BASIC METABOLIC PANEL
Anion gap: 6 (ref 5–15)
BUN: 6 mg/dL (ref 6–23)
CHLORIDE: 104 mmol/L (ref 96–112)
CO2: 26 mmol/L (ref 19–32)
CREATININE: 0.46 mg/dL — AB (ref 0.50–1.10)
Calcium: 9.1 mg/dL (ref 8.4–10.5)
Glucose, Bld: 91 mg/dL (ref 70–99)
Potassium: 3.8 mmol/L (ref 3.5–5.1)
SODIUM: 136 mmol/L (ref 135–145)

## 2014-06-28 MED ORDER — OXYCODONE HCL 10 MG PO TABS: 15.0000 mg | ORAL_TABLET | ORAL | Status: DC | PRN

## 2014-06-28 NOTE — Progress Notes (Signed)
Patient discharged to home with family via wheelchair, discharge instructions reviewed with patient who verbalized understanding. No new RX's given.

## 2014-06-28 NOTE — Progress Notes (Signed)
Total 24 hour drug use on PCA : 20.43 Total demands 45  Total delivered 37

## 2014-06-28 NOTE — Progress Notes (Signed)
Patient reports that "pain had in increased in her joints in her legs and r shoulder".  Pain 8/10. Requesting additional pain medication. Paged Walden Field NP on call and orders received for a one time order of 2 mg of dilaudid IV to be given.

## 2014-06-28 NOTE — Progress Notes (Signed)
PCA stopped per MD order. Patient wants to keep PCA until closer to discharge. Paged MD.

## 2014-06-28 NOTE — Progress Notes (Signed)
Dilauded High dose PCA 5 ml of 2 mg/ml wasted with Sandie Ano RN.

## 2014-06-28 NOTE — Plan of Care (Signed)
Problem: Phase III Progression Outcomes Goal: Pain controlled on oral analgesia Outcome: Progressing oxyir added to pain regimen.

## 2014-06-28 NOTE — Plan of Care (Signed)
Problem: Phase I Progression Outcomes Goal: Pain controlled with appropriate interventions Outcome: Progressing Weaning patient's pain medications for possible discharge in am.

## 2014-06-28 NOTE — ED Provider Notes (Addendum)
CSN: [id number]     Arrival date & time 06/25/14  1138 History   First MD Initiated Contact with Patient 06/25/14 1200     Chief Complaint  Patient presents with  . Sickle Cell Pain Crisis  . Headache  . Shoulder Pain      HPI  Patient presents for evaluation of headache and neck pain. She is pregnant. Has history of sickle cell disease. Follow-up Dr. Zigmund Daniel. Symptoms for the last 3-4 days. Currently [redacted] weeks pregnant. No atypical features for her. Right neck pain. No midline neck pain. No fevers. No atypical features. Typically migraine" she has  Typical  "right shoulder sickle cell pain". Symptoms for 24 hours with vomiting at home.  Past Medical History  Diagnosis Date  . H/O: 1 miscarriage 03/22/2011    Pt reports 2 miscarriages.  [name] 01/08/2009  . Depression 01/06/2011  . GERD (gastroesophageal reflux disease) 02/17/2011  . Trichotillomania     h/o  . Blood transfusion     "lots"  . Sickle cell anemia with crisis   . Exertional dyspnea     "sometimes"  . Sickle cell anemia   . Headache(784.0)   . Migraines 11/08/11    "@ least twice/month"  . Chronic back pain     "very severe; have knot in my back; from tight muscle; take RX and exercise for it"  . Mood swings 11/08/11    "I go back and forth; real bad"  . Pregnancy   . Blood dyscrasia     SICKLE CELL   Past Surgical History  Procedure Laterality Date  . Cholecystectomy  05/2010  . Dilation and curettage of uterus  02/20/11    S/P miscarriage   Family History  Problem Relation Age of Onset  . Alcoholism    . Depression    . Hypercholesterolemia    . Hypertension    . Migraines    . Diabetes Maternal Grandmother   . Diabetes Paternal Grandmother   . Diabetes Maternal Grandfather   . Sickle cell trait Mother   . Sickle cell trait Father    History  Substance Use Topics  . Smoking status: Former Smoker -- 0.25 packs/day for 1 years    Types: Cigarettes    Quit date: 03/25/2013  . Smokeless  tobacco: Never Used  . Alcohol Use: No     Comment: pt states she quit marijuan in May 2013. Rare ETOH, + cigarettes.  She is enrolled in school   OB History    Gravida Para Term Preterm AB TAB SAB Ectopic Multiple Living   2    1  1          Obstetric Comments   Miscarried in October 2012 at about 7 weeks     Review of Systems  Constitutional: Negative for fever, chills, diaphoresis, appetite change and fatigue.  HENT: Negative for mouth sores, sore throat and trouble swallowing.   Eyes: Negative for visual disturbance.  Respiratory: Negative for cough, chest tightness, shortness of breath and wheezing.   Cardiovascular: Negative for chest pain.  Gastrointestinal: Negative for nausea, vomiting, abdominal pain, diarrhea and abdominal distention.  Endocrine: Negative for polydipsia, polyphagia and polyuria.  Genitourinary: Negative for dysuria, frequency and hematuria.  Musculoskeletal: Positive for myalgias and neck pain. Negative for gait problem.  Skin: Negative for color change, pallor and rash.  Neurological: Positive for headaches. Negative for dizziness, syncope and light-headedness.  Hematological: Does not bruise/bleed easily.  Psychiatric/Behavioral: Negative for behavioral problems and confusion.  Allergies  Carrot oil and Latex  Home Medications   Prior to Admission medications   Medication Sig Start Date End Date Taking? Authorizing Provider  acetaminophen (TYLENOL) 500 MG tablet Take 1,000 mg by mouth every 6 (six) hours as needed for moderate pain or headache.   Yes Historical Provider, MD  folic acid (FOLVITE) 1 MG tablet Take 2 tablets (2 mg total) by mouth daily. Patient taking differently: Take 1 mg by mouth daily.  04/24/14  Yes Dorena Dew, FNP  Prenatal Vit-Fe Fumarate-FA (PRENATAL MULTIVITAMIN) TABS tablet Take 1 tablet by mouth daily at 12 noon.   Yes Historical Provider, MD  alprazolam Duanne Moron) 2 MG tablet Take 1 tablet by mouth 2 (two) times  daily as needed. anxiety 04/29/14   Historical Provider, MD  methadone (DOLOPHINE) 5 MG tablet Take 1 tablet (5 mg total) by mouth at bedtime. Patient not taking: Reported on 06/24/2014 06/19/14   Dorena Dew, FNP  Oxycodone HCl 10 MG TABS Take 1.5 tablets (15 mg total) by mouth every 4 (four) hours as needed (moderate pain). 06/28/14 07/05/14  Kalman Shan, MD   BP 95/64 mmHg  Pulse 88  Temp(Src) 98.7 F (37.1 C) (Oral)  Resp 16  Ht 5\' 3"  (1.6 m)  Wt 175 lb 9.6 oz (79.652 kg)  BMI 31.11 kg/m2  SpO2 94%  LMP 03/20/2014 Physical Exam  Constitutional: She is oriented to person, place, and time. She appears well-developed and well-nourished. No distress.  HENT:  Head: Normocephalic.  Eyes: Conjunctivae are normal. Pupils are equal, round, and reactive to light. No scleral icterus.  Neck: Normal range of motion. Neck supple. No thyromegaly present.  No meningismus  Cardiovascular: Normal rate and regular rhythm.  Exam reveals no gallop and no friction rub.   No murmur heard. Pulmonary/Chest: Effort normal and breath sounds normal. No respiratory distress. She has no wheezes. She has no rales.  Abdominal: Soft. Bowel sounds are normal. She exhibits no distension. There is no tenderness. There is no rebound.  Musculoskeletal: Normal range of motion.       Back:  Neurological: She is alert and oriented to person, place, and time.  Skin: Skin is warm and dry. No rash noted.  Psychiatric: She has a normal mood and affect. Her behavior is normal.    ED Course  Procedures (including critical care time) Labs Review Labs Reviewed  CBC WITH DIFFERENTIAL/PLATELET - Abnormal; Notable for the following:    WBC 15.8 (*)    RBC 2.86 (*)    Hemoglobin 8.6 (*)    HCT 25.6 (*)    RDW 17.3 (*)    Platelets 483 (*)    Neutro Abs 12.0 (*)    Monocytes Absolute 1.1 (*)    All other components within normal limits  COMPREHENSIVE METABOLIC PANEL - Abnormal; Notable for the following:     Creatinine, Ser 0.47 (*)    Total Bilirubin 1.8 (*)    All other components within normal limits  RETICULOCYTES - Abnormal; Notable for the following:    Retic Ct Pct 17.4 (*)    RBC. 2.86 (*)    Retic Count, Manual 497.6 (*)    All other components within normal limits  CBC WITH DIFFERENTIAL/PLATELET - Abnormal; Notable for the following:    WBC 13.8 (*)    RBC 2.63 (*)    Hemoglobin 7.9 (*)    HCT 23.6 (*)    RDW 16.8 (*)    Platelets 446 (*)  Neutro Abs 9.4 (*)    Monocytes Absolute 1.3 (*)    All other components within normal limits  COMPREHENSIVE METABOLIC PANEL - Abnormal; Notable for the following:    Sodium 134 (*)    Glucose, Bld 104 (*)    Creatinine, Ser 0.44 (*)    Total Bilirubin 1.6 (*)    All other components within normal limits  BASIC METABOLIC PANEL - Abnormal; Notable for the following:    Creatinine, Ser 0.46 (*)    All other components within normal limits  CBC WITH DIFFERENTIAL/PLATELET - Abnormal; Notable for the following:    WBC 13.8 (*)    RBC 2.80 (*)    Hemoglobin 8.5 (*)    HCT 25.1 (*)    RDW 17.3 (*)    Platelets 502 (*)    Neutro Abs 9.2 (*)    Monocytes Absolute 1.4 (*)    All other components within normal limits  URINE CULTURE  URINALYSIS, ROUTINE W REFLEX MICROSCOPIC  LACTATE DEHYDROGENASE  CBC WITH DIFFERENTIAL/PLATELET    Imaging Review No results found.   EKG Interpretation None      MDM   Final diagnoses:  Hb-SS disease with crisis    Pt D/W  Dr. Zigmund Daniel before and after pt evaluation.  Pt hydrated and medicated.  Remains symptomatic.  Will be admitted.     Tanna Furry, MD 06/28/14 [place], MD 06/28/14 [phone]

## 2014-06-28 NOTE — Plan of Care (Signed)
Problem: Phase III Progression Outcomes Goal: IV fluids wean to po Outcome: Completed/Met Date Met:  06/28/14 kvo fluids to run with PCA

## 2014-06-28 NOTE — Discharge Summary (Signed)
Physician Discharge Summary  JOLINA SYMONDS VZD:[id number] DOB: 08/31/1990 DOA: 06/25/2014  PCP: MATTHEWS,MICHELLE A., MD  Admit date: 06/25/2014 Discharge date: 06/28/2014  Discharge Diagnoses:  Active Problems:   Hb-SS disease with crisis   Sickle cell anemia   Discharge Condition: Stable  Disposition: home     Follow-up Information    Follow up with MATTHEWS,MICHELLE A., MD.   Specialty:  Internal Medicine   Why:  As needed or as previously scheduled   Contact information:   [place] [address] [phone]       Diet:regular  Wt Readings from Last 3 Encounters:  06/28/14 175 lb 9.6 oz (79.652 kg)  06/24/14 166 lb (75.297 kg)  06/11/14 163 lb 4.8 oz (74.072 kg)    History of present illness:  [name] is a 24yo with sickle-cell disease who is currently [redacted] weeks pregnant who initially presented to the ER with symptoms consistent with vaso-occlusive crisis. Pt states that her pain began 2 days ago in her right arm and shoulder. She currently rates the pain as a 7/10. She is getting Dilaudid IV, which helps but it makes her sleepy. She was admitted to the [facility] hospital due to concern of a subchorionic hemorrhage found on ultrasound. She is eating and drinking well. Her last bowel movement was today. She is being transferred to our service for further pain management.    Hospital Course:  Sickle Cell Crisis: Patient presented with pain characteristic of acute vaso-occlusive crisis.Pt's pain was treated with bolus IV analgesics initially and was later transitioned with a Dilaudid PCA and ketorolac. She was continued on her home dose of Methadone. PCA was decreased as pain improved and she was started on her home dose of Oxycodone. Her pain was well controlled with her oral home regimen and discontinuation of PCA. She had overall improvement of her pain and was physically functional upon discharge. Her Hgb remained around her baseline during  her stay. She was eating well and reported one bowel movements the night before discharge. Pt given a prescription for 5 days of her home dose oxycodone. She was encouraged to rest, drink plenty of fluids, and remain indoors where it is warm.  Discharge Exam:  Filed Vitals:   06/28/14 0655  BP:   Pulse:   Temp:   Resp: 16   Filed Vitals:   06/28/14 0451 06/28/14 0524 06/28/14 0611 06/28/14 0655  BP:      Pulse: 95 96 88   Temp:      TempSrc:      Resp: 16   16  Height:      Weight:      SpO2: 98% 98% 94%    General: Alert, awake, oriented x3, in no acute distress.  HEENT: [place]/AT PEERL, EOMI  Neck: Trachea midline, no masses, no thyromegal,y no JVD, no carotid bruit  OROPHARYNX: Moist, No exudate/ erythema/lesions.  Heart: Regular rate and rhythm, without murmurs, rubs, gallops.  Lungs: Clear to auscultation but unable to take deep breath due to CP, no wheezing or rhonchi noted. No increased vocal fremitus resonant to percussion  Abdomen: Soft, nontender, +gravid, positive bowel sounds, no masses Neuro: No focal neurological deficits noted cranial nerves II through XII grossly intact. Strength 5 out of 5 in bilateral upper and lower extremities.  Musculoskeletal: No warm swelling or erythema around joints, no spinal tenderness noted. Psychiatric: Patient alert and oriented x3, good insight and cognition, good recent to remote recall.   Discharge  Instructions Follow-up with PCP as needed or previously scheduled.    Medication List    ASK your doctor about these medications        acetaminophen 500 MG tablet  Commonly known as:  TYLENOL  Take 1,000 mg by mouth every 6 (six) hours as needed for moderate pain or headache.     alprazolam 2 MG tablet  Commonly known as:  XANAX  Take 1 tablet by mouth 2 (two) times daily as needed. anxiety     folic acid 1 MG tablet  Commonly known as:  FOLVITE  Take 2 tablets (2 mg total) by mouth daily.     methadone 5 MG tablet   Commonly known as:  DOLOPHINE  Take 1 tablet (5 mg total) by mouth at bedtime.     Oxycodone HCl 10 MG Tabs  Take 1.5 tablets (15 mg total) by mouth every 6 (six) hours as needed (moderate pain).     prenatal multivitamin Tabs tablet  Take 1 tablet by mouth daily at 12 noon.          The results of significant diagnostics from this hospitalization (including imaging, microbiology, ancillary and laboratory) are listed below for reference.    Significant Diagnostic Studies: US Ob Comp Less 14 Wks  2014-06-20   CLINICAL DATA:  Pelvic pain. Ten weeks 1 day by last menstrual period. Sickle cell.  EXAM: OBSTETRIC <14 WK [place] AND TRANSVAGINAL OB US  TECHNIQUE: Both transabdominal and transvaginal ultrasound examinations were performed for complete evaluation of the gestation as well as the maternal uterus, adnexal regions, and pelvic cul-de-sac. Transvaginal technique was performed to assess early pregnancy.  COMPARISON:  None.  FINDINGS: Intrauterine gestational sac: Visualized/normal in shape.  Yolk sac:  Visualized  Embryo:  Visualized  Cardiac Activity: Visualized  Heart Rate:  152 bpm  MSD:    mm    w     d  CRL:   34.7  mm   10 w 3 d                  [place] EDC:  Maternal uterus/adnexae: Small subchorionic hemorrhage. No adnexal masses. No free fluid.  IMPRESSION: Small subchorionic hemorrhage.  No adnexal masses.  No free fluid.   Electronically Signed   By: Rolm Baptise M.D.   On: 06/20/14 17:25   US Ob Transvaginal  20-Jun-2014   CLINICAL DATA:  Pelvic pain. Ten weeks 1 day by last menstrual period. Sickle cell.  EXAM: OBSTETRIC <14 WK [place] AND TRANSVAGINAL OB US  TECHNIQUE: Both transabdominal and transvaginal ultrasound examinations were performed for complete evaluation of the gestation as well as the maternal uterus, adnexal regions, and pelvic cul-de-sac. Transvaginal technique was performed to assess early pregnancy.  COMPARISON:  None.  FINDINGS: Intrauterine gestational sac:  Visualized/normal in shape.  Yolk sac:  Visualized  Embryo:  Visualized  Cardiac Activity: Visualized  Heart Rate:  152 bpm  MSD:    mm    w     d  CRL:   34.7  mm   10 w 3 d                  [place] EDC:  Maternal uterus/adnexae: Small subchorionic hemorrhage. No adnexal masses. No free fluid.  IMPRESSION: Small subchorionic hemorrhage.  No adnexal masses.  No free fluid.   Electronically Signed   By: Rolm Baptise M.D.   On: 2014/06/20 17:25   [place] Mfm Fetal Nuchal Translucency  06/17/2014  OBSTETRICAL ULTRASOUND: This exam was performed within a [facility]. The OB US report was generated in the [facility] system, and faxed to the ordering physician.   This report is available in the [facility]. See the [facility] Obstetric US report via the Image Link.   Microbiology: Recent Results (from the past 240 hour(s))  Urine culture     Status: None   Collection Time: 06/25/14  2:51 PM  Result Value Ref Range Status   Specimen Description URINE, CLEAN CATCH  Final   Special Requests NONE  Final   Colony Count   Final    30,000 COLONIES/ML Performed at [facility]    Culture   Final    Multiple bacterial morphotypes present, none predominant. Suggest appropriate recollection if clinically indicated. Performed at [facility]    Report Status 06/27/2014 FINAL  Final     Labs: Basic Metabolic Panel:  Recent Labs Lab 06/25/14 1330 06/27/14 0426 06/28/14 0421  NA 136 134* 136  K 4.0 3.8 3.8  CL 107 106 104  CO2 22 22 26   GLUCOSE 88 104* 91  BUN 7 6 6   CREATININE 0.47* 0.44* 0.46*  CALCIUM 9.3 8.6 9.1   Liver Function Tests:  Recent Labs Lab 06/25/14 1330 06/27/14 0426  AST 26 22  ALT 23 19  ALKPHOS 49 44  BILITOT 1.8* 1.6*  PROT 7.6 6.6  ALBUMIN 4.2 3.6    Recent Labs Lab 06/25/14 1330 06/27/14 0426 06/28/14 0421  WBC 15.8* 13.8* 13.8*  NEUTROABS 12.0* 9.4* 9.2*  HGB 8.6* 7.9* 8.5*  HCT 25.6* 23.6* 25.1*  MCV 89.5 89.7 89.6  PLT 483* 446* 502*     Time coordinating discharge: >30 min.  SignedKalman Shan  06/28/2014, 1:02 PM

## 2014-06-30 ENCOUNTER — Telehealth: Payer: Self-pay | Admitting: Internal Medicine

## 2014-06-30 DIAGNOSIS — D571 Sickle-cell disease without crisis: Secondary | ICD-10-CM

## 2014-06-30 DIAGNOSIS — G8929 Other chronic pain: Secondary | ICD-10-CM

## 2014-06-30 DIAGNOSIS — D5700 Hb-SS disease with crisis, unspecified: Secondary | ICD-10-CM | POA: Diagnosis not present

## 2014-06-30 NOTE — H&P (Signed)
Hospital Admission Note Date: 06/30/2014  Patient name: [name] Medical record number: [id number] Date of birth: 10-03-1990 Age: 24 y.o. Gender: female PCP: Kseniya Grunden A., MD  Attending physician: [name], MD  Chief Complaint:Pain in upper back and neck x 5 days  History of Present Illness:Pt with Hb SS who started having pain 5 days ago.Pt states that she has been having pain in her upper back And neck which had been responding to her oral pain medications. She has also been taking Tylenol for pain occurring between doses of Oxycodone. Pt states that her pain was at 8/10 on admission but is now down to 7/10 after several doses of IV Dilaudid. She normally has a baseline pain level of 3/10. She denies fevers/chills but has a headache and some mild photophobia. Pt is pregnant [redacted] weeks and 6 days. She is under the care of MFM at [facility].   Scheduled Meds: Continuous Infusions: PRN Meds:. Allergies: Carrot oil and Latex Past Medical History  Diagnosis Date  . H/O: 1 miscarriage 03/22/2011    Pt reports 2 miscarriages.  [name] 01/08/2009  . Depression 01/06/2011  . GERD (gastroesophageal reflux disease) 02/17/2011  . Trichotillomania     h/o  . Blood transfusion     "lots"  . Sickle cell anemia with crisis   . Exertional dyspnea     "sometimes"  . Sickle cell anemia   . Headache(784.0)   . Migraines 11/08/11    "@ least twice/month"  . Chronic back pain     "very severe; have knot in my back; from tight muscle; take RX and exercise for it"  . Mood swings 11/08/11    "I go back and forth; real bad"  . Pregnancy   . Blood dyscrasia     SICKLE CELL   Past Surgical History  Procedure Laterality Date  . Cholecystectomy  05/2010  . Dilation and curettage of uterus  02/20/11    S/P miscarriage   Family History  Problem Relation Age of Onset  . Alcoholism    . Depression    . Hypercholesterolemia    . Hypertension    . Migraines     . Diabetes Maternal Grandmother   . Diabetes Paternal Grandmother   . Diabetes Maternal Grandfather   . Sickle cell trait Mother   . Sickle cell trait Father    History   Social History  . Marital Status: Single    Spouse Name: N/A  . Number of Children: N/A  . Years of Education: N/A   Occupational History  . Not on file.   Social History Main Topics  . Smoking status: Former Smoker -- 0.25 packs/day for 1 years    Types: Cigarettes    Quit date: 03/25/2013  . Smokeless tobacco: Never Used  . Alcohol Use: No     Comment: pt states she quit marijuan in May 2013. Rare ETOH, + cigarettes.  She is enrolled in school  . Drug Use: No  . Sexual Activity: Not Currently    Birth Control/ Protection: None   Other Topics Concern  . 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 Physical Exam: No intake or output data in the 24 hours ending 06/25/14 1458 General: Alert, awake,  oriented x3, in no acute distress.  HEENT: [place]/AT PEERL, EOMI, anicteric Neck: Trachea midline,  no masses, no thyromegal,y no JVD, no carotid bruit OROPHARYNX:  Moist, No exudate/ erythema/lesions.  Heart: Regular rate and rhythm, without murmurs, rubs, gallops.  Lungs: Clear to auscultation, no wheezing or rhonchi noted.   Abdomen: Soft, nontender, nondistended, positive bowel sounds, no masses no hepatosplenomegaly noted.  Neuro: No focal neurological deficits noted cranial nerves II through XII grossly intact.  Strength at baseline in bilateral upper and lower extremities. Musculoskeletal: No warm swelling or erythema around joints, no spinal tenderness noted. Psychiatric: Patient alert and oriented x3, good insight and cognition, good recent to remote recall. Lymph node survey: No cervical axillary or inguinal  lymphadenopathy noted.   Lab results:  Recent Labs (last 2 labs)      Recent Labs  06/25/14 1330  NA 136  K 4.0  CL 107  CO2 22  GLUCOSE 88  BUN 7  CREATININE 0.47*  CALCIUM 9.3      Recent Labs (last 2 labs)      Recent Labs  06/25/14 1330  AST 26  ALT 23  ALKPHOS 49  BILITOT 1.8*  PROT 7.6  ALBUMIN 4.2      Recent Labs (last 2 labs)     No results for input(s): LIPASE, AMYLASE in the last 72 hours.    Recent Labs (last 2 labs)      Recent Labs  06/25/14 1330  WBC 15.8*  NEUTROABS 12.0*  HGB 8.6*  HCT 25.6*  MCV 89.5  PLT 483*                 No results for input(s): AST, ALT, ALKPHOS, BILITOT, PROT, ALBUMIN in the last 72 hours. No results for input(s): LIPASE, AMYLASE in the last 72 hours.  No results for input(s): CKTOTAL, CKMB, CKMBINDEX, TROPONINI in the last 72 hours. Invalid input(s): POCBNP No results for input(s): DDIMER in the last 72 hours. No results for input(s): HGBA1C in the last 72 hours. No results for input(s): CHOL, HDL, LDLCALC, TRIG, CHOLHDL, LDLDIRECT in the last 72 hours. No results for input(s): TSH, T4TOTAL, T3FREE, THYROIDAB in the last 72 hours.  Invalid input(s): FREET3 No results for input(s): VITAMINB12, FOLATE, FERRITIN, TIBC, IRON, RETICCTPCT in the last 72 hours.   Imaging results:  US Ob Comp Less 14 Wks  06/01/2014   CLINICAL DATA:  Pelvic pain. Ten weeks 1 day by last menstrual period. Sickle cell.  EXAM: OBSTETRIC <14 WK [place] AND TRANSVAGINAL OB US  TECHNIQUE: Both transabdominal and transvaginal ultrasound examinations were performed for complete evaluation of the gestation as well as the maternal uterus, adnexal regions, and pelvic cul-de-sac. Transvaginal technique was performed to assess early pregnancy.  COMPARISON:  None.  FINDINGS: Intrauterine gestational sac: Visualized/normal in shape.  Yolk sac:  Visualized  Embryo:  Visualized  Cardiac Activity:  Visualized  Heart Rate:  152 bpm  MSD:    mm    w     d  CRL:   34.7  mm   10 w 3 d                  [place] EDC:  Maternal uterus/adnexae: Small subchorionic hemorrhage. No adnexal masses. No free fluid.  IMPRESSION: Small subchorionic hemorrhage.  No adnexal masses.  No free fluid.   Electronically Signed   By: Rolm Baptise M.D.   On: 06/01/2014 17:25   US Ob Transvaginal  06/01/2014  CLINICAL DATA:  Pelvic pain. Ten weeks 1 day by last menstrual period. Sickle cell.  EXAM: OBSTETRIC <14 WK [place] AND TRANSVAGINAL OB US  TECHNIQUE: Both transabdominal and transvaginal ultrasound examinations were performed for complete evaluation of the gestation as well as the maternal uterus, adnexal regions, and pelvic cul-de-sac. Transvaginal technique was performed to assess early pregnancy.  COMPARISON:  None.  FINDINGS: Intrauterine gestational sac: Visualized/normal in shape.  Yolk sac:  Visualized  Embryo:  Visualized  Cardiac Activity: Visualized  Heart Rate:  152 bpm  MSD:    mm    w     d  CRL:   34.7  mm   10 w 3 d                  [place] EDC:  Maternal uterus/adnexae: Small subchorionic hemorrhage. No adnexal masses. No free fluid.  IMPRESSION: Small subchorionic hemorrhage.  No adnexal masses.  No free fluid.   Electronically Signed   By: Rolm Baptise M.D.   On: 06/01/2014 17:25   [place] Mfm Fetal Nuchal Translucency  06/17/2014   OBSTETRICAL ULTRASOUND: This exam was performed within a [facility]. The OB US report was generated in the AS system, and faxed to the ordering physician.   This report is available in the [facility]. See the [facility] Obstetric US report via the Image Link.   Assessment and Plan: 1. Hb SS with crisis: Pt will be started on Dilaudid PCA at weight based dose. She cannot have NSAID due to 1st trimester pregnancy. She will be treated with hypotonic IVF (D5.45) and given topical heat for adjunctive therapy. I will re-evaluate in 2-3 hours and consider basal rate dependent on her  progress.  2. IUP: Pt in week 13-14 of IUP. Fetal heart tones normal. She is followed by MFM but will continue Sickle Cell Care at [facility] hospital until week 24.  Total time spent 40 minutes.  Jentri Aye A. 06/30/2014, 12:27 PM

## 2014-06-30 NOTE — Telephone Encounter (Signed)
Refill request for oxycodone 10mg . LOV 04/29/2014. Please advise.  Thanks!

## 2014-07-01 MED ORDER — OXYCODONE HCL 10 MG PO TABS: 15.0000 mg | ORAL_TABLET | ORAL | Status: DC | PRN

## 2014-07-01 NOTE — Telephone Encounter (Signed)
Meds ordered this encounter  Medications  . Oxycodone HCl 10 MG TABS    Sig: Take 1.5 tablets (15 mg total) by mouth every 4 (four) hours as needed (moderate pain).    Dispense:  42 tablet    Refill:  0    Order Specific Question:  Supervising Provider    Answer:  Jeanmarie Plant [[id number]]  Dorena Dew, FNP  Reviewed [place] Substance Reporting system prior to reorder

## 2014-07-07 ENCOUNTER — Telehealth: Payer: Self-pay | Admitting: Internal Medicine

## 2014-07-07 ENCOUNTER — Ambulatory Visit: Payer: Medicare Other | Admitting: Internal Medicine

## 2014-07-07 DIAGNOSIS — G8929 Other chronic pain: Secondary | ICD-10-CM

## 2014-07-07 DIAGNOSIS — D571 Sickle-cell disease without crisis: Secondary | ICD-10-CM

## 2014-07-07 NOTE — Telephone Encounter (Signed)
Refill request for oxycodone 10mg . LOV 04/29/2014. Please advise. Thanks!

## 2014-07-08 ENCOUNTER — Encounter: Payer: Self-pay | Admitting: *Deleted

## 2014-07-08 MED ORDER — OXYCODONE HCL 10 MG PO TABS: 15.0000 mg | ORAL_TABLET | ORAL | Status: DC | PRN

## 2014-07-08 NOTE — Telephone Encounter (Signed)
Meds ordered this encounter  Medications  . Oxycodone HCl 10 MG TABS    Sig: Take 1.5 tablets (15 mg total) by mouth every 4 (four) hours as needed (moderate pain).    Dispense:  42 tablet    Refill:  0    Order Specific Question:  Supervising Provider    Answer:  Liston Alba A [[id number]]  Reviewed [place] Substance Reporting system prior to reorder   Dorena Dew, FNP

## 2014-07-11 ENCOUNTER — Telehealth: Payer: Self-pay | Admitting: Internal Medicine

## 2014-07-11 ENCOUNTER — Encounter (HOSPITAL_COMMUNITY): Payer: Self-pay

## 2014-07-11 NOTE — Telephone Encounter (Signed)
Left message for patient to confirm/cancel appointment for 07/17/14 at 11:30.

## 2014-07-13 ENCOUNTER — Inpatient Hospital Stay (HOSPITAL_COMMUNITY)
Admission: EM | Admit: 2014-07-13 | Discharge: 2014-07-16 | DRG: 781 | Disposition: A | Discharge status: Home / Self Care | Payer: Medicare Other | Attending: Internal Medicine | Admitting: Internal Medicine

## 2014-07-13 ENCOUNTER — Encounter (HOSPITAL_COMMUNITY): Payer: Self-pay | Admitting: Emergency Medicine

## 2014-07-13 DIAGNOSIS — Z331 Pregnant state, incidental: Secondary | ICD-10-CM | POA: Diagnosis not present

## 2014-07-13 DIAGNOSIS — D638 Anemia in other chronic diseases classified elsewhere: Secondary | ICD-10-CM | POA: Diagnosis present

## 2014-07-13 DIAGNOSIS — R262 Difficulty in walking, not elsewhere classified: Secondary | ICD-10-CM | POA: Diagnosis present

## 2014-07-13 DIAGNOSIS — E876 Hypokalemia: Secondary | ICD-10-CM | POA: Diagnosis present

## 2014-07-13 DIAGNOSIS — Z87891 Personal history of nicotine dependence: Secondary | ICD-10-CM | POA: Diagnosis not present

## 2014-07-13 DIAGNOSIS — F329 Major depressive disorder, single episode, unspecified: Secondary | ICD-10-CM | POA: Diagnosis present

## 2014-07-13 DIAGNOSIS — Z3A16 16 weeks gestation of pregnancy: Secondary | ICD-10-CM | POA: Diagnosis present

## 2014-07-13 DIAGNOSIS — K219 Gastro-esophageal reflux disease without esophagitis: Secondary | ICD-10-CM | POA: Diagnosis present

## 2014-07-13 DIAGNOSIS — G8929 Other chronic pain: Secondary | ICD-10-CM | POA: Diagnosis present

## 2014-07-13 DIAGNOSIS — M549 Dorsalgia, unspecified: Secondary | ICD-10-CM | POA: Diagnosis present

## 2014-07-13 DIAGNOSIS — D72829 Elevated white blood cell count, unspecified: Secondary | ICD-10-CM | POA: Diagnosis present

## 2014-07-13 DIAGNOSIS — D5700 Hb-SS disease with crisis, unspecified: Secondary | ICD-10-CM | POA: Diagnosis not present

## 2014-07-13 DIAGNOSIS — O99012 Anemia complicating pregnancy, second trimester: Secondary | ICD-10-CM | POA: Diagnosis not present

## 2014-07-13 DIAGNOSIS — D571 Sickle-cell disease without crisis: Secondary | ICD-10-CM

## 2014-07-13 DIAGNOSIS — Z9049 Acquired absence of other specified parts of digestive tract: Secondary | ICD-10-CM | POA: Diagnosis present

## 2014-07-13 DIAGNOSIS — G43909 Migraine, unspecified, not intractable, without status migrainosus: Secondary | ICD-10-CM | POA: Diagnosis present

## 2014-07-13 DIAGNOSIS — K5909 Other constipation: Secondary | ICD-10-CM | POA: Diagnosis not present

## 2014-07-13 LAB — COMPREHENSIVE METABOLIC PANEL
ALK PHOS: 49 U/L (ref 39–117)
ALT: 22 U/L (ref 0–35)
AST: 26 U/L (ref 0–37)
Albumin: 3.7 g/dL (ref 3.5–5.2)
Anion gap: 7 (ref 5–15)
BUN: 5 mg/dL — ABNORMAL LOW (ref 6–23)
CO2: 22 mmol/L (ref 19–32)
CREATININE: 0.43 mg/dL — AB (ref 0.50–1.10)
Calcium: 8.9 mg/dL (ref 8.4–10.5)
Chloride: 109 mmol/L (ref 96–112)
GFR calc Af Amer: >90 mL/min (ref >90)
GFR calc non Af Amer: >90 mL/min (ref >90)
GLUCOSE: 82 mg/dL (ref 70–99)
Potassium: 3.6 mmol/L (ref 3.5–5.1)
Sodium: 138 mmol/L (ref 135–145)
Total Bilirubin: 1.2 mg/dL (ref 0.3–1.2)
Total Protein: 7 g/dL (ref 6.0–8.3)

## 2014-07-13 LAB — CBC WITH DIFFERENTIAL/PLATELET
BASOS PCT: 0 % (ref 0–1)
Basophils Absolute: 0 10*3/uL (ref 0.0–0.1)
Eosinophils Absolute: 0.1 10*3/uL (ref 0.0–0.7)
Eosinophils Relative: 1 % (ref 0–5)
HEMATOCRIT: 24.6 % — AB (ref 36.0–46.0)
Hemoglobin: 8.4 g/dL — ABNORMAL LOW (ref 12.0–15.0)
LYMPHS ABS: 2.4 10*3/uL (ref 0.7–4.0)
Lymphocytes Relative: 18 % (ref 12–46)
MCH: 31 pg (ref 26.0–34.0)
MCHC: 34.1 g/dL (ref 30.0–36.0)
MCV: 90.8 fL (ref 78.0–100.0)
MONO ABS: 1.2 10*3/uL — AB (ref 0.1–1.0)
Monocytes Relative: 9 % (ref 3–12)
NEUTROS ABS: 9.5 10*3/uL — AB (ref 1.7–7.7)
Neutrophils Relative %: 72 % (ref 43–77)
Platelets: 485 10*3/uL — ABNORMAL HIGH (ref 150–400)
RBC: 2.71 MIL/uL — ABNORMAL LOW (ref 3.87–5.11)
RDW: 18.1 % — AB (ref 11.5–15.5)
WBC: 13.2 10*3/uL — ABNORMAL HIGH (ref 4.0–10.5)
nRBC: 2 /100 WBC — ABNORMAL HIGH

## 2014-07-13 LAB — RETICULOCYTES
RBC.: 2.71 MIL/uL — AB (ref 3.87–5.11)
RETIC CT PCT: 21.3 % — AB (ref 0.4–3.1)
Retic Count, Absolute: 577.2 10*3/uL — ABNORMAL HIGH (ref 19.0–186.0)

## 2014-07-13 MED ORDER — METHADONE HCL 5 MG PO TABS
5.0000 mg | ORAL_TABLET | Freq: Every day | ORAL | Status: DC
  Administered 2014-07-13 – 2014-07-15 (×3): 5 mg via ORAL
  Filled 2014-07-13 (×3): qty 1

## 2014-07-13 MED ORDER — SENNOSIDES-DOCUSATE SODIUM 8.6-50 MG PO TABS
1.0000 | ORAL_TABLET | Freq: Every day | ORAL | Status: DC
  Administered 2014-07-13 – 2014-07-14 (×2): 1 via ORAL
  Filled 2014-07-13 (×3): qty 1

## 2014-07-13 MED ORDER — SODIUM CHLORIDE 0.9 % IV BOLUS (SEPSIS)
1000.0000 mL | Freq: Once | INTRAVENOUS | Status: AC
  Administered 2014-07-13: 1000 mL via INTRAVENOUS

## 2014-07-13 MED ORDER — DIPHENHYDRAMINE HCL 50 MG/ML IJ SOLN
12.5000 mg | Freq: Four times a day (QID) | INTRAMUSCULAR | Status: DC | PRN
  Filled 2014-07-13: qty 0.25

## 2014-07-13 MED ORDER — NALOXONE HCL 0.4 MG/ML IJ SOLN: 0.4000 mg | INTRAMUSCULAR | Status: DC | PRN

## 2014-07-13 MED ORDER — HYDROMORPHONE HCL 1 MG/ML IJ SOLN: 1.0000 mg | Freq: Once | INTRAMUSCULAR | Status: DC

## 2014-07-13 MED ORDER — HYDROMORPHONE HCL 2 MG/ML IJ SOLN
2.0000 mg | INTRAMUSCULAR | Status: AC | PRN
  Administered 2014-07-13 (×3): 2 mg via INTRAVENOUS
  Filled 2014-07-13 (×3): qty 1

## 2014-07-13 MED ORDER — DIPHENHYDRAMINE HCL 12.5 MG/5ML PO ELIX: 12.5000 mg | ORAL_SOLUTION | Freq: Four times a day (QID) | ORAL | Status: DC | PRN

## 2014-07-13 MED ORDER — MAGNESIUM HYDROXIDE 400 MG/5ML PO SUSP: 15.0000 mL | Freq: Every day | ORAL | Status: DC | PRN

## 2014-07-13 MED ORDER — POLYETHYLENE GLYCOL 3350 17 G PO PACK: 17.0000 g | PACK | Freq: Two times a day (BID) | ORAL | Status: DC | PRN

## 2014-07-13 MED ORDER — ENOXAPARIN SODIUM 40 MG/0.4ML ~~LOC~~ SOLN
40.0000 mg | SUBCUTANEOUS | Status: DC
  Filled 2014-07-13 (×4): qty 0.4

## 2014-07-13 MED ORDER — SODIUM CHLORIDE 0.9 % IJ SOLN: 9.0000 mL | INTRAMUSCULAR | Status: DC | PRN

## 2014-07-13 MED ORDER — DEXTROSE-NACL 5-0.45 % IV SOLN
INTRAVENOUS | Status: DC
  Administered 2014-07-13 – 2014-07-15 (×6): via INTRAVENOUS

## 2014-07-13 MED ORDER — ONDANSETRON HCL 4 MG/2ML IJ SOLN
4.0000 mg | Freq: Four times a day (QID) | INTRAMUSCULAR | Status: DC | PRN
  Administered 2014-07-13 – 2014-07-15 (×4): 4 mg via INTRAVENOUS
  Filled 2014-07-13 (×4): qty 2

## 2014-07-13 MED ORDER — HYDROMORPHONE HCL 2 MG/ML IJ SOLN: 2.0000 mg | INTRAMUSCULAR | Status: DC | PRN

## 2014-07-13 MED ORDER — HYDROMORPHONE HCL 2 MG/ML IJ SOLN
2.0000 mg | Freq: Once | INTRAMUSCULAR | Status: AC
  Administered 2014-07-13: 2 mg via INTRAVENOUS
  Filled 2014-07-13: qty 1

## 2014-07-13 MED ORDER — PRENATAL MULTIVITAMIN CH
1.0000 | ORAL_TABLET | Freq: Every day | ORAL | Status: DC
  Filled 2014-07-13 (×3): qty 1

## 2014-07-13 MED ORDER — ACETAMINOPHEN 500 MG PO TABS: 1000.0000 mg | ORAL_TABLET | Freq: Four times a day (QID) | ORAL | Status: DC | PRN

## 2014-07-13 MED ORDER — FOLIC ACID 1 MG PO TABS
2.0000 mg | ORAL_TABLET | Freq: Every day | ORAL | Status: DC
  Administered 2014-07-13 – 2014-07-16 (×4): 2 mg via ORAL
  Filled 2014-07-13 (×4): qty 2

## 2014-07-13 MED ORDER — HYDROMORPHONE 2 MG/ML HIGH CONCENTRATION IV PCA SOLN
INTRAVENOUS | Status: DC
  Administered 2014-07-13: 17:00:00 via INTRAVENOUS
  Administered 2014-07-13: 4.2 mg via INTRAVENOUS
  Administered 2014-07-13: 3 mg via INTRAVENOUS
  Administered 2014-07-14: 5.4 mg via INTRAVENOUS
  Administered 2014-07-14: 3.6 mg via INTRAVENOUS
  Filled 2014-07-13 (×2): qty 25

## 2014-07-13 NOTE — Plan of Care (Signed)
Problem: Phase II Progression Outcomes Goal: Tolerating diet Outcome: Progressing Patient ate almost 100 % of dinner time tray. Had vomited earlier in evening but denies nausea now

## 2014-07-13 NOTE — H&P (Signed)
[facility] History and Physical  [name] QPY:[id number] DOB: [birth year] DOA: 07/13/2014   PCP: [name]   Chief Complaint: pain in arm  [id number] [name] is a 24yo with sickle-cell disease who is currently [redacted] weeks pregnant who initially presented to the ER with symptoms consistent with vaso-occlusive crisis. Pt states that her pain began today in her right foot. She currently rates the pain as a 8/10. She is unable to walk on it, and had to be carried into the car. She tried taking her home meds for it but it didn't help. When she walks on foot pain worsens and radiates to the knee. She is eating and drinking well. Her last bowel movement was yesterday.    Review of Systems:  Constitutional: No weight loss, night sweats, Fevers, chills, fatigue.  HEENT: No headaches, dizziness, seizures, vision changes, difficulty swallowing,Tooth/dental problems,Sore throat, No sneezing, itching, ear ache, nasal congestion, post nasal drip,  Cardio-vascular: No chest pain, Orthopnea, PND, swelling in lower extremities, anasarca, dizziness, palpitations  GI: No heartburn, indigestion, abdominal pain, nausea, vomiting, diarrhea, change in bowel habits, loss of appetite  Resp: No shortness of breath with exertion or at rest. No excess mucus, no productive cough, No non-productive cough, No coughing up of blood.No change in color of mucus.No wheezing.No chest wall deformity  Skin: no rash or lesions.  GU: no dysuria, change in color of urine, no urgency or frequency. No flank pain.  Musculoskeletal: No joint swelling. No decreased range of motion. No back pain. +pain in righ foot.  Psych: No change in mood or affect. No depression or anxiety. No memory loss.    Past Medical History  Diagnosis Date  . H/O: 1 miscarriage 03/22/2011    Pt reports 2 miscarriages.  [name] 01/08/2009  . Depression 01/06/2011  . GERD (gastroesophageal reflux disease) 02/17/2011   . Trichotillomania     h/o  . Blood transfusion     "lots"  . Sickle cell anemia with crisis   . Exertional dyspnea     "sometimes"  . Sickle cell anemia   . Headache(784.0)   . Migraines 11/08/11    "@ least twice/month"  . Chronic back pain     "very severe; have knot in my back; from tight muscle; take RX and exercise for it"  . Mood swings 11/08/11    "I go back and forth; real bad"  . Pregnancy   . Blood dyscrasia     SICKLE CELL   Past Surgical History  Procedure Laterality Date  . Cholecystectomy  05/2010  . Dilation and curettage of uterus  02/20/11    S/P miscarriage   Social History:  reports that she quit smoking about 15 months ago. Her smoking use included Cigarettes. She has a .25 pack-year smoking history. She has never used smokeless tobacco. She reports that she does not drink alcohol or use illicit drugs.  Allergies  Allergen Reactions  . Carrot Oil Hives and Swelling  . Latex Rash    Family History  Problem Relation Age of Onset  . Alcoholism    . Depression    . Hypercholesterolemia    . Hypertension    . Migraines    . Diabetes Maternal Grandmother   . Diabetes Paternal Grandmother   . Diabetes Maternal Grandfather   . Sickle cell trait Mother   . Sickle cell trait Father     Prior to Admission medications   Medication Sig Start Date End Date  Taking? Authorizing Provider  acetaminophen (TYLENOL) 500 MG tablet Take 1,000 mg by mouth every 6 (six) hours as needed for moderate pain or headache.   Yes Historical Provider, [name]  folic acid (FOLVITE) 1 MG tablet Take 2 tablets (2 mg total) by mouth daily. 04/24/14  Yes Nancy Dew, FNP  methadone (DOLOPHINE) 5 MG tablet Take 1 tablet (5 mg total) by mouth at bedtime. 05/27/14  Yes Nancy Dew, FNP  Prenatal Vit-Fe Fumarate-FA (PRENATAL MULTIVITAMIN) TABS tablet Take 1 tablet by mouth daily at 12 noon.   Yes Historical Provider, [name]  alprazolam Duanne Moron) 2 MG tablet Take 1 tablet by mouth 2 (two)  times daily as needed. anxiety 04/29/14   Historical Provider, [name]  Oxycodone HCl 10 MG TABS Take 1.5 tablets (15 mg total) by mouth every 6 (six) hours as needed (moderate pain). 06/02/14 06/10/14  Nancy Dew, FNP   Physical Exam: Filed Vitals:   07/13/14 1105 07/13/14 1218 07/13/14 1307 07/13/14 1520  BP: 118/68 102/65 105/59 108/58  Pulse: 107 93 80 98  Temp: 98.1 F (36.7 C) 98.2 F (36.8 C) 98.8 F (37.1 C)   TempSrc: Oral Oral Oral   Resp: 18 20 18 18   SpO2: 98% 96% 96% 95%   General: Alert, awake, oriented x3, in no acute distress.  HEENT: [place]/AT PEERL, EOMI Neck: Trachea midline,  no masses, no thyromegaly, no LAD OROPHARYNX:  Moist, No exudate/ erythema/lesions.  Heart: Regular rate and rhythm, without murmurs, rubs, gallops Lungs: Clear to auscultation, no wheezing or rhonchi noted. No increased vocal fremitus resonant to percussion  Abdomen: Soft, nontender, nondistended, positive bowel sounds, no masses no hepatosplenomegaly noted..  Neuro: No focal neurological deficits noted cranial nerves II through XII grossly intact. Extremities: no c/c/e Psych: A+OX3, good insight and recall  Labs on Admission:   Basic Metabolic Panel:  Recent Labs Lab 07/13/14 1108  NA 138  K 3.6  CL 109  CO2 22  GLUCOSE 82  BUN 5*  CREATININE 0.43*  CALCIUM 8.9   Liver Function Tests:  Recent Labs Lab 07/13/14 1108  AST 26  ALT 22  ALKPHOS 49  BILITOT 1.2  PROT 7.0  ALBUMIN 3.7   CBC:  Recent Labs Lab 07/13/14 1108  WBC 13.2*  NEUTROABS 9.5*  HGB 8.4*  HCT 24.6*  MCV 90.8  PLT 485*   BNP: Invalid input(s): POCBNP   Radiological Exams on Admission: No results found.  EKG: Independently reviewed.   Assessment/Plan: Active Problems:   Sickle cell anemia with crisis  1. Sickle Cell Crisis: Start her on a Dilaudid PCA at 0.6mg  q 10 min with a lockout at 3mg  per hour. Continue home Methadone 5mg  QHS for long acting control..  2. Sickle Cell Care:  Continue Folic acid avoid hydroxyurea 3. Anemia: Hgb stable at 8. Transfuse if hgb<7.0 to keep 8.0 or if symptomatic. 4. Pregnancy: Avoid teratogens. Continue folic acid but will increase to 2 mg daily.  5. FEN/GI : Regular diet IV fluids- start D5/0.45 at 120ml/hr Bowel regimen in place  Code Status: full DVT Prophylaxis: SCDs Family Communication: none Disposition Plan: pending improvement   Time spend: 58 min Kalman Shan, [name]  Pager [phone]  If 7PM-7AM, please contact night-coverage [email or web address] Password [facility] 07/13/2014, 3:39 PM

## 2014-07-13 NOTE — Plan of Care (Signed)
Problem: Phase I Progression Outcomes Goal: Pt. aware of pain management plan Outcome: Progressing IV out from around 1945 til 2300 until another site could be obtained. Patient refused SQ prns for pain while IV was out. PCA restarted once new site obtained.

## 2014-07-13 NOTE — ED Notes (Signed)
Report called, pt ready for transport.

## 2014-07-13 NOTE — Progress Notes (Signed)
Patient refused lovenox and SCDs tonight. Patient educated on need for prophylactic measure to prevent "blood clots" but still refused.

## 2014-07-13 NOTE — ED Provider Notes (Signed)
CSN: [id number]     Arrival date & time 07/13/14  1040 History   First MD Initiated Contact with Patient 07/13/14 1128     Chief Complaint  Patient presents with  . Sickle Cell Pain Crisis     (Consider location/radiation/quality/duration/timing/severity/associated sxs/prior Treatment) HPI 24 year old female with a past history of sickle cell pain and chronic pain presents with a recurrent sickle cell pain crisis. In the middle the night she started developing right ankle pain. The pain is so severe she cannot walk. She states there is no weakness or numbness just severe pain. She typically gets her sickle cell crises in her joints and has had it in her right ankle before. No swelling or redness. No fevers. Patient is a proximally 4 months pregnant and denies any abdominal pain or vaginal bleeding. Patient is chronically on methadone and oxycodone as taken his medicines without significant relief.  Past Medical History  Diagnosis Date  . H/O: 1 miscarriage 03/22/2011    Pt reports 2 miscarriages.  [name] 01/08/2009  . Depression 01/06/2011  . GERD (gastroesophageal reflux disease) 02/17/2011  . Trichotillomania     h/o  . Blood transfusion     "lots"  . Sickle cell anemia with crisis   . Exertional dyspnea     "sometimes"  . Sickle cell anemia   . Headache(784.0)   . Migraines 11/08/11    "@ least twice/month"  . Chronic back pain     "very severe; have knot in my back; from tight muscle; take RX and exercise for it"  . Mood swings 11/08/11    "I go back and forth; real bad"  . Pregnancy   . Blood dyscrasia     SICKLE CELL   Past Surgical History  Procedure Laterality Date  . Cholecystectomy  05/2010  . Dilation and curettage of uterus  02/20/11    S/P miscarriage   Family History  Problem Relation Age of Onset  . Alcoholism    . Depression    . Hypercholesterolemia    . Hypertension    . Migraines    . Diabetes Maternal Grandmother   . Diabetes Paternal  Grandmother   . Diabetes Maternal Grandfather   . Sickle cell trait Mother   . Sickle cell trait Father    History  Substance Use Topics  . Smoking status: Former Smoker -- 0.25 packs/day for 1 years    Types: Cigarettes    Quit date: 03/25/2013  . Smokeless tobacco: Never Used  . Alcohol Use: No     Comment: pt states she quit marijuan in May 2013. Rare ETOH, + cigarettes.  She is enrolled in school   OB History    Gravida Para Term Preterm AB TAB SAB Ectopic Multiple Living   2    1  1          Obstetric Comments   Miscarried in October 2012 at about 7 weeks     Review of Systems  Constitutional: Negative for fever.  Respiratory: Positive for shortness of breath. Negative for cough.   Cardiovascular: Negative for chest pain.  Gastrointestinal: Negative for nausea, vomiting and abdominal pain.  Genitourinary: Negative for vaginal bleeding.  Musculoskeletal: Positive for arthralgias. Negative for back pain and joint swelling.  Skin: Negative for color change and wound.  Neurological: Negative for weakness and numbness.  All other systems reviewed and are negative.     Allergies  Carrot oil and Latex  Home Medications   Prior to Admission  medications   Medication Sig Start Date End Date Taking? Authorizing Provider  acetaminophen (TYLENOL) 500 MG tablet Take 1,000 mg by mouth every 6 (six) hours as needed for moderate pain or headache.   Yes Historical Provider, MD  folic acid (FOLVITE) 1 MG tablet Take 2 tablets (2 mg total) by mouth daily. Patient taking differently: Take 1 mg by mouth daily.  04/24/14  Yes Dorena Dew, FNP  methadone (DOLOPHINE) 5 MG tablet Take 1 tablet (5 mg total) by mouth at bedtime. 06/19/14  Yes Dorena Dew, FNP  Oxycodone HCl 10 MG TABS Take 1.5 tablets (15 mg total) by mouth every 4 (four) hours as needed (moderate pain). 07/08/14 07/14/14 Yes Dorena Dew, FNP  Prenatal Vit-Fe Fumarate-FA (PRENATAL MULTIVITAMIN) TABS tablet Take 1  tablet by mouth daily at 12 noon.   Yes Historical Provider, MD  alprazolam Duanne Moron) 2 MG tablet Take 1 tablet by mouth 2 (two) times daily as needed. anxiety 04/29/14   Historical Provider, MD   BP 118/68 mmHg  Pulse 107  Temp(Src) 98.1 F (36.7 C) (Oral)  Resp 18  SpO2 98%  LMP 03/20/2014 Physical Exam  Constitutional: She is oriented to person, place, and time. She appears well-developed and well-nourished.  HENT:  Head: Normocephalic and atraumatic.  Right Ear: External ear normal.  Left Ear: External ear normal.  Nose: Nose normal.  Eyes: Right eye exhibits no discharge. Left eye exhibits no discharge.  Cardiovascular: Normal rate, regular rhythm and normal heart sounds.   Pulses:      Dorsalis pedis pulses are 2+ on the right side, and 2+ on the left side.  Pulmonary/Chest: Effort normal and breath sounds normal. She has no wheezes. She has no rales.  Abdominal: Soft. There is no tenderness.  Musculoskeletal:       Right ankle: She exhibits normal range of motion and no swelling. Tenderness.       Right lower leg: She exhibits no tenderness.       Right foot: There is tenderness. There is no swelling.  Neurological: She is alert and oriented to person, place, and time.  Skin: Skin is warm and dry.  Nursing note and vitals reviewed.   ED Course  Procedures (including critical care time) Labs Review Labs Reviewed  CBC WITH DIFFERENTIAL/PLATELET - Abnormal; Notable for the following:    WBC 13.2 (*)    RBC 2.71 (*)    Hemoglobin 8.4 (*)    HCT 24.6 (*)    RDW 18.1 (*)    Platelets 485 (*)    nRBC 2 (*)    Neutro Abs 9.5 (*)    Monocytes Absolute 1.2 (*)    All other components within normal limits  COMPREHENSIVE METABOLIC PANEL - Abnormal; Notable for the following:    BUN 5 (*)    Creatinine, Ser 0.43 (*)    All other components within normal limits  RETICULOCYTES - Abnormal; Notable for the following:    Retic Ct Pct 21.3 (*)    RBC. 2.71 (*)    Retic Count,  Manual 577.2 (*)    All other components within normal limits    Imaging Review No results found.   EKG Interpretation None      MDM   Final diagnoses:  Sickle cell anemia with crisis    Patient with joint pain that is typical of her prior pain crises. Poor pain control, she states she can't walk on it due to severity of pain. No signs  of septic joint on exam given no swelling, warmth or erythema. Admit to medicine for pain control    Ephraim Hamburger, MD 07/13/14 1725

## 2014-07-13 NOTE — ED Notes (Signed)
Pt from home c/o sickle cell pain in right ankle starting last night. She reports taking oxycodone 10 without relief. She also reports she [redacted] weeks pregnant and is receiving prenatal care .

## 2014-07-14 ENCOUNTER — Telehealth: Payer: Self-pay | Admitting: Internal Medicine

## 2014-07-14 DIAGNOSIS — D571 Sickle-cell disease without crisis: Secondary | ICD-10-CM

## 2014-07-14 DIAGNOSIS — G8929 Other chronic pain: Secondary | ICD-10-CM

## 2014-07-14 LAB — BASIC METABOLIC PANEL
ANION GAP: 7 (ref 5–15)
BUN: 6 mg/dL (ref 6–23)
CALCIUM: 8.6 mg/dL (ref 8.4–10.5)
CO2: 23 mmol/L (ref 19–32)
Chloride: 108 mmol/L (ref 96–112)
Creatinine, Ser: 0.44 mg/dL — ABNORMAL LOW (ref 0.50–1.10)
GFR calc Af Amer: >90 mL/min (ref >90)
GFR calc non Af Amer: >90 mL/min (ref >90)
Glucose, Bld: 101 mg/dL — ABNORMAL HIGH (ref 70–99)
Potassium: 3.4 mmol/L — ABNORMAL LOW (ref 3.5–5.1)
Sodium: 138 mmol/L (ref 135–145)

## 2014-07-14 LAB — CBC WITH DIFFERENTIAL/PLATELET
BASOS ABS: 0 10*3/uL (ref 0.0–0.1)
Basophils Relative: 0 % (ref 0–1)
EOS ABS: 0.5 10*3/uL (ref 0.0–0.7)
Eosinophils Relative: 3 % (ref 0–5)
HEMATOCRIT: 23.3 % — AB (ref 36.0–46.0)
Hemoglobin: 7.9 g/dL — ABNORMAL LOW (ref 12.0–15.0)
LYMPHS ABS: 3.5 10*3/uL (ref 0.7–4.0)
Lymphocytes Relative: 22 % (ref 12–46)
MCH: 30.7 pg (ref 26.0–34.0)
MCHC: 33.9 g/dL (ref 30.0–36.0)
MCV: 90.7 fL (ref 78.0–100.0)
MONOS PCT: 10 % (ref 3–12)
Monocytes Absolute: 1.6 10*3/uL — ABNORMAL HIGH (ref 0.1–1.0)
NEUTROS ABS: 10.3 10*3/uL — AB (ref 1.7–7.7)
NEUTROS PCT: 65 % (ref 43–77)
PLATELETS: 471 10*3/uL — AB (ref 150–400)
RBC: 2.57 MIL/uL — ABNORMAL LOW (ref 3.87–5.11)
RDW: 18.1 % — AB (ref 11.5–15.5)
WBC: 15.9 10*3/uL — AB (ref 4.0–10.5)
nRBC: 2 /100 WBC — ABNORMAL HIGH

## 2014-07-14 LAB — RETICULOCYTES
RBC.: 2.57 MIL/uL — ABNORMAL LOW (ref 3.87–5.11)
RETIC CT PCT: 22.6 % — AB (ref 0.4–3.1)
Retic Count, Absolute: 580.8 10*3/uL — ABNORMAL HIGH (ref 19.0–186.0)

## 2014-07-14 LAB — LACTATE DEHYDROGENASE: LDH: 249 U/L (ref 94–250)

## 2014-07-14 MED ORDER — POTASSIUM CHLORIDE CRYS ER 20 MEQ PO TBCR
20.0000 meq | EXTENDED_RELEASE_TABLET | Freq: Once | ORAL | Status: AC
  Administered 2014-07-14: 20 meq via ORAL
  Filled 2014-07-14: qty 1

## 2014-07-14 MED ORDER — HYDROMORPHONE 2 MG/ML HIGH CONCENTRATION IV PCA SOLN
INTRAVENOUS | Status: DC
  Administered 2014-07-14: 23:00:00 via INTRAVENOUS
  Administered 2014-07-14: 4.8 mg via INTRAVENOUS
  Administered 2014-07-14: 7 mg via INTRAVENOUS
  Administered 2014-07-14: 4.2 mg via INTRAVENOUS
  Administered 2014-07-14: 12.6 mg via INTRAVENOUS
  Administered 2014-07-15: 2.8 mg via INTRAVENOUS
  Administered 2014-07-15: 7.7 mg via INTRAVENOUS
  Administered 2014-07-15: 1.4 mg via INTRAVENOUS
  Administered 2014-07-15: 7.7 mg via INTRAVENOUS
  Administered 2014-07-15: 7 mg via INTRAVENOUS
  Administered 2014-07-16: 6.3 mg via INTRAVENOUS
  Administered 2014-07-16: 0.7 mg via INTRAVENOUS
  Administered 2014-07-16: 5.6 mg via INTRAVENOUS
  Filled 2014-07-14: qty 25

## 2014-07-14 NOTE — Progress Notes (Signed)
Since IV had been restarted by IV team patient has removed dsg over site and "repositioned catheter". She has also pulled dsg off to tighten lines. So no IV has blown. Called OR to see in Anesthesia could come try.

## 2014-07-14 NOTE — Telephone Encounter (Signed)
Refill request for oxycodone 10mg . LOV 04/29/2014. Please advise. Thanks!

## 2014-07-14 NOTE — Progress Notes (Signed)
Patient ID: [name], female   DOB: 03-Jan-1991, 24 y.o.   MRN: [id number] SICKLE CELL SERVICE PROGRESS NOTE  [name] WUJ:[id number] DOB: May 24, 1990 DOA: 07/13/2014 PCP: MATTHEWS,MICHELLE A., MD  [id number] [name] is a 24yo with sickle-cell disease who is currently [redacted] weeks pregnant who initially presented to the ER with symptoms consistent with vaso-occlusive crisis. Pt states that her pain began today in her right foot. She currently rates the pain as a 8/10. She is unable to walk on it, and had to be carried into the car. She tried taking her home meds for it but it didn't help. When she walks on foot pain worsens and radiates to the knee. She is eating and drinking well. Her last bowel movement was yesterday.   Consultants:  none  Procedures:  none  Antibiotics:  none  HPI/Subjective: Pt states that her pain is the same and she isn't finding the PCA helpful in relieving her pain. Pain is mostly in her right foot. Pt is starting to walk but is limping due to pain.   Objective: Filed Vitals:   07/14/14 0640 07/14/14 0800 07/14/14 1040 07/14/14 1318  BP:   104/54   Pulse:   102   Temp:   98.8 F (37.1 C)   TempSrc:   Oral   Resp:  11 16 15   Height:      Weight:      SpO2: 98% 99% 98% 97%   Weight change:   Intake/Output Summary (Last 24 hours) at 07/14/14 1412 Last data filed at 07/14/14 0830  Gross per 24 hour  Intake 1686.3 ml  Output   1050 ml  Net  636.3 ml    General: Alert, awake, oriented x3, in no acute distress.  HEENT: [employer]/AT PEERL, EOMI Neck: Trachea midline,  no masses, no thyromegal,y no JVD, no carotid bruit OROPHARYNX:  Moist, no erythema or oropharyngeal lesions Heart: Regular rate and rhythm, without murmurs, rubs, gallops. Lungs: Clear to auscultation Abdomen: Soft, nontender, +gravid, positive bowel sounds, no masses no hepatosplenomegaly noted.  Neuro: No focal neurological deficits noted cranial nerves II through XII grossly  intact. Normal gait. Musculoskeletal: No warm swelling or erythema around joints, no spinal tenderness noted.  Extremities: no cyanosis or edema. Tenderness of right foot maximally over the medial ankle.   Data Reviewed: Basic Metabolic Panel:  Recent Labs Lab 07/13/14 1108 07/14/14 0500  NA 138 138  K 3.6 3.4*  CL 109 108  CO2 22 23  GLUCOSE 82 101*  BUN 5* 6  CREATININE 0.43* 0.44*  CALCIUM 8.9 8.6   Liver Function Tests:  Recent Labs Lab 07/13/14 1108  AST 26  ALT 22  ALKPHOS 49  BILITOT 1.2  PROT 7.0  ALBUMIN 3.7   CBC:  Recent Labs Lab 07/13/14 1108 07/14/14 0500  WBC 13.2* 15.9*  NEUTROABS 9.5* 10.3*  HGB 8.4* 7.9*  HCT 24.6* 23.3*  MCV 90.8 90.7  PLT 485* 471*     Scheduled Meds: . enoxaparin (LOVENOX) injection  40 mg Subcutaneous Q24H  . folic acid  2 mg Oral Daily  . HYDROmorphone PCA 2 mg/mL   Intravenous 6 times per day  .  HYDROmorphone (DILAUDID) injection  1 mg Intravenous Once  . methadone  5 mg Oral QHS  . potassium chloride SA  20 mEq Oral Once  . prenatal multivitamin  1 tablet Oral Q1200  . senna-docusate  1 tablet Oral QHS   Continuous Infusions: . dextrose 5 % and 0.45%  NaCl 100 mL/hr at 07/14/14 1321    Active Problems:   Sickle cell anemia with crisis   Assessment/Plan: Active Problems:   Sickle cell anemia with crisis  1. Sickle Cell Crisis: Pt reports pain consistent with VOC. Continue Dilaudid PCA but will increase bolus doses to 0.7 mg q 10 min. Avoid Toradol due to pregnancy. 2. Sickle Cell Care: Continue Folic acid 2mg . Avoid Hydrea due to pregnancy. 3. Anemia: Hgb has gone from 8.4 to 7.6 in the past day. Her LDH is low. Will monitor. Will transfuse if hgb<7 or symptomatic. 4. Pregnancy: Avoid teratogens, continue prenatal vitamin.   5. FEN/GI :  Regular diet Continue IV fluids-D5/0.45% Bowel regimen in place  Time spent-25 min Code Status: full DVT Prophylaxis: enoxaparin Family Communication:  none Disposition Plan: pending improvement  Kalman Shan  Pager [phone]. If 7PM-7AM, please contact night-coverage.  07/14/2014, 2:12 PM  LOS: 1 day   Kalman Shan

## 2014-07-15 DIAGNOSIS — D72829 Elevated white blood cell count, unspecified: Secondary | ICD-10-CM

## 2014-07-15 DIAGNOSIS — K5909 Other constipation: Secondary | ICD-10-CM

## 2014-07-15 LAB — RETICULOCYTES
RBC.: 2.54 MIL/uL — ABNORMAL LOW (ref 3.87–5.11)
Retic Count, Absolute: 561.3 10*3/uL — ABNORMAL HIGH (ref 19.0–186.0)
Retic Ct Pct: 22.1 % — ABNORMAL HIGH (ref 0.4–3.1)

## 2014-07-15 LAB — BASIC METABOLIC PANEL
Anion gap: 7 (ref 5–15)
BUN: 5 mg/dL — ABNORMAL LOW (ref 6–23)
CALCIUM: 8.7 mg/dL (ref 8.4–10.5)
CO2: 24 mmol/L (ref 19–32)
CREATININE: 0.57 mg/dL (ref 0.50–1.10)
Chloride: 107 mmol/L (ref 96–112)
GFR calc Af Amer: >90 mL/min (ref >90)
GFR calc non Af Amer: >90 mL/min (ref >90)
GLUCOSE: 102 mg/dL — AB (ref 70–99)
Potassium: 3.8 mmol/L (ref 3.5–5.1)
Sodium: 138 mmol/L (ref 135–145)

## 2014-07-15 LAB — CBC WITH DIFFERENTIAL/PLATELET
BASOS ABS: 0 10*3/uL (ref 0.0–0.1)
BASOS PCT: 0 % (ref 0–1)
EOS PCT: 2 % (ref 0–5)
Eosinophils Absolute: 0.3 10*3/uL (ref 0.0–0.7)
HCT: 23.3 % — ABNORMAL LOW (ref 36.0–46.0)
Hemoglobin: 8.1 g/dL — ABNORMAL LOW (ref 12.0–15.0)
Lymphocytes Relative: 16 % (ref 12–46)
Lymphs Abs: 2.6 10*3/uL (ref 0.7–4.0)
MCH: 31.9 pg (ref 26.0–34.0)
MCHC: 34.8 g/dL (ref 30.0–36.0)
MCV: 91.7 fL (ref 78.0–100.0)
Monocytes Absolute: 1.5 10*3/uL — ABNORMAL HIGH (ref 0.1–1.0)
Monocytes Relative: 9 % (ref 3–12)
NEUTROS PCT: 73 % (ref 43–77)
Neutro Abs: 11.8 10*3/uL — ABNORMAL HIGH (ref 1.7–7.7)
PLATELETS: 467 10*3/uL — AB (ref 150–400)
RBC: 2.54 MIL/uL — AB (ref 3.87–5.11)
RDW: 18.3 % — ABNORMAL HIGH (ref 11.5–15.5)
WBC: 16.2 10*3/uL — AB (ref 4.0–10.5)
nRBC: 2 /100 WBC — ABNORMAL HIGH

## 2014-07-15 LAB — URINALYSIS, ROUTINE W REFLEX MICROSCOPIC
Bilirubin Urine: NEGATIVE
GLUCOSE, UA: NEGATIVE mg/dL
Hgb urine dipstick: NEGATIVE
Ketones, ur: NEGATIVE mg/dL
Leukocytes, UA: NEGATIVE
Nitrite: NEGATIVE
Protein, ur: NEGATIVE mg/dL
SPECIFIC GRAVITY, URINE: 1.007 (ref 1.005–1.030)
UROBILINOGEN UA: 1 mg/dL (ref 0.0–1.0)
pH: 7 (ref 5.0–8.0)

## 2014-07-15 LAB — LACTATE DEHYDROGENASE: LDH: 236 U/L (ref 94–250)

## 2014-07-15 LAB — MAGNESIUM: MAGNESIUM: 1.8 mg/dL (ref 1.5–2.5)

## 2014-07-15 MED ORDER — DIPHENHYDRAMINE HCL 50 MG/ML IJ SOLN
25.0000 mg | INTRAMUSCULAR | Status: DC | PRN
  Filled 2014-07-15: qty 0.5

## 2014-07-15 MED ORDER — METOCLOPRAMIDE HCL 5 MG/ML IJ SOLN: 5.0000 mg | Freq: Four times a day (QID) | INTRAMUSCULAR | Status: DC | PRN

## 2014-07-15 MED ORDER — MAGNESIUM HYDROXIDE 400 MG/5ML PO SUSP
15.0000 mL | ORAL | Status: AC
  Filled 2014-07-15: qty 30

## 2014-07-15 MED ORDER — OXYCODONE HCL 5 MG PO TABS
15.0000 mg | ORAL_TABLET | ORAL | Status: DC
  Administered 2014-07-15 – 2014-07-16 (×6): 15 mg via ORAL
  Filled 2014-07-15 (×6): qty 3

## 2014-07-15 MED ORDER — DIPHENHYDRAMINE HCL 25 MG PO CAPS
25.0000 mg | ORAL_CAPSULE | Freq: Four times a day (QID) | ORAL | Status: DC | PRN
Start: 2014-07-15 — End: 2014-07-16

## 2014-07-15 MED ORDER — PROMETHAZINE HCL 25 MG RE SUPP: 12.5000 mg | Freq: Four times a day (QID) | RECTAL | Status: DC | PRN

## 2014-07-15 NOTE — Progress Notes (Signed)
Pt was found asleep with her finger probe off and also her nasal cannula. Unable to wake patient at this time. Turned pca off.  Will cont to monitor patient. Pt also wants to put her bed in the high position. I put it back in the lowest position.

## 2014-07-15 NOTE — Progress Notes (Signed)
SICKLE CELL SERVICE PROGRESS NOTE  [name] ZOX:[id number] DOB: January 15, 1991 DOA: 07/13/2014 PCP: Nancy Fiorella A., MD  Assessment/Plan: Active Problems:   Sickle cell anemia with crisis     1. Hb SS with crisis: Pt who is [redacted] weeks GA by last menstrual period is here with vaso-occlusive episode. She rates her pain as 6/10 and mostly localized to right ankle.I will continue PCA at decreased dose and schedule Oxycodone and contiue PRN clinician assisted doses of 1 mg every 3 hours. Continue Methadone. Anticipate discharge home tomorrow. Pt to pick up prescription from clinic for her Oxycodone. 2. Leukocytosis: This is likely due to her crisis as she has no evidence of infection however in light of pregnancy will check a U/A. Will continue to monitor. 3. Intra Uterine Pregnancy: Pt is estimated [redacted] wks GA by Obstetric U/S.  Her last Hb Electrophoresis was 06/11/2014 which showed  Hb S%  52.2%. The goal during pregnancy is to keep % of Hb S < 30%. Pt needs to follow up in the clinic for Electrophoresis in 07/22/2014 and possible partial exchange transfusion on 07/23/2014. 4. Anemia of Chronic Disease: Hb stable. See above 5. Chronic pain: Continue Methadone 6. Hypokalemia: Potassium normal today after oral supplementation. Will check Mg. 7. Constipation: Will continue Senna-S adn treat further with milk of magnesia.  Code Status: Full Code Family Communication: N/A Disposition Plan: Discharge likely tomorrow.  [name] A.  Pager [phone]. If 7PM-7AM, please contact night-coverage.  07/15/2014, 9:54 AM  LOS: 2 days   Admitting History: [name] is a 24yo with sickle-cell disease who is currently [redacted] weeks pregnant who initially presented to the ER with symptoms consistent with vaso-occlusive crisis. Pt states that her pain began today in her right foot. She currently rates the pain as a 8/10. She is unable to walk on it, and had to be carried into the car. She tried taking  her home meds for it but it didn't help. When she walks on foot pain worsens and radiates to the knee. She is eating and drinking well. Her last bowel movement was yesterday.   Consultants:  None  Procedures:  None  Antibiotics:  None  HPI/Subjective: Pt awake and alert today. She rates her pain as 6/10 and mostly localized to right ankle. Last BM prior to admission.  Objective: Filed Vitals:   07/15/14 0100 07/15/14 0455 07/15/14 0500 07/15/14 0817  BP: 98/51  125/67   Pulse: 97  101   Temp: 98.1 F (36.7 C)  98.2 F (36.8 C)   TempSrc: Oral  Oral   Resp: 15 16 16 13   Height:      Weight:      SpO2: 90%  98% 98%   Weight change:   Intake/Output Summary (Last 24 hours) at 07/15/14 0954 Last data filed at 07/15/14 0503  Gross per 24 hour  Intake    480 ml  Output      2 ml  Net    478 ml    General: Alert, awake, oriented x3, in no acute distress.  HEENT: [place]/AT PEERL, EOMI, anicteric. Heart: Regular rate and rhythm, without murmurs, rubs, gallops.  Lungs: Clear to auscultation, no wheezing or rhonchi noted. No increased vocal fremitus resonant to percussion  Abdomen: Soft, nontender, nondistended, positive bowel sounds, no masses no hepatosplenomegaly noted.  Neuro: No focal neurological deficits noted cranial nerves II through XII grossly intact.  Strength normal in bilateral upper and lower extremities. Musculoskeletal: No warm swelling or erythema around  joints, no spinal tenderness noted. Psychiatric: Patient alert and oriented x3, good insight and cognition, good recent to remote recall. Lymph node survey: No cervical axillary or inguinal lymphadenopathy noted.   Data Reviewed: Basic Metabolic Panel:  Recent Labs Lab 07/13/14 1108 07/14/14 0500 07/15/14 0455  NA 138 138 138  K 3.6 3.4* 3.8  CL 109 108 107  CO2 22 23 24   GLUCOSE 82 101* 102*  BUN 5* 6 5*  CREATININE 0.43* 0.44* 0.57  CALCIUM 8.9 8.6 8.7   Liver Function Tests:  Recent  Labs Lab 07/13/14 1108  AST 26  ALT 22  ALKPHOS 49  BILITOT 1.2  PROT 7.0  ALBUMIN 3.7   No results for input(s): LIPASE, AMYLASE in the last 168 hours. No results for input(s): AMMONIA in the last 168 hours. CBC:  Recent Labs Lab 07/13/14 1108 07/14/14 0500 07/15/14 0455  WBC 13.2* 15.9* 16.2*  NEUTROABS 9.5* 10.3* 11.8*  HGB 8.4* 7.9* 8.1*  HCT 24.6* 23.3* 23.3*  MCV 90.8 90.7 91.7  PLT 485* 471* 467*   Cardiac Enzymes: No results for input(s): CKTOTAL, CKMB, CKMBINDEX, TROPONINI in the last 168 hours. BNP (last 3 results) No results for input(s): PROBNP in the last 8760 hours. CBG: No results for input(s): GLUCAP in the last 168 hours.  No results found for this or any previous visit (from the past 240 hour(s)).   Studies: [place] Mfm Fetal Nuchal Translucency  06/17/2014   OBSTETRICAL ULTRASOUND: This exam was performed within a [facility]. The OB US report was generated in the AS system, and faxed to the ordering physician.   This report is available in the [facility]. See the AS Obstetric US report via the Image Link.   Scheduled Meds: . enoxaparin (LOVENOX) injection  40 mg Subcutaneous Q24H  . folic acid  2 mg Oral Daily  . HYDROmorphone PCA 2 mg/mL   Intravenous 6 times per day  .  HYDROmorphone (DILAUDID) injection  1 mg Intravenous Once  . magnesium hydroxide  15 mL Oral NOW  . methadone  5 mg Oral QHS  . oxyCODONE  15 mg Oral Q4H  . prenatal multivitamin  1 tablet Oral Q1200  . senna-docusate  1 tablet Oral QHS   Continuous Infusions: . dextrose 5 % and 0.45% NaCl 100 mL/hr at 07/14/14 2322    Time spent 40 minutes

## 2014-07-15 NOTE — Progress Notes (Signed)
Awake now, c/o that she could not push her button for pain med. Explained to patient why she could not push her button at this time. Finger probe for oxygen saturation replace on finger and nasal cannula placed on patient. She voiced understanding why had not been able to receive her pain med. Will cont to monitor patient.

## 2014-07-16 ENCOUNTER — Other Ambulatory Visit: Payer: Self-pay | Admitting: Internal Medicine

## 2014-07-16 DIAGNOSIS — Z331 Pregnant state, incidental: Secondary | ICD-10-CM

## 2014-07-16 DIAGNOSIS — E876 Hypokalemia: Secondary | ICD-10-CM

## 2014-07-16 MED ORDER — OXYCODONE HCL 10 MG PO TABS
15.0000 mg | ORAL_TABLET | ORAL | Status: DC | PRN
Start: 2014-07-16 — End: 2014-07-16

## 2014-07-16 MED ORDER — OXYCODONE HCL 10 MG PO TABS: 15.0000 mg | ORAL_TABLET | ORAL | Status: AC | PRN

## 2014-07-16 NOTE — Care Management Note (Signed)
07/16/14 Marney Doctor RN,BSN,NCM [phone] IM letter given to pt.

## 2014-07-16 NOTE — Discharge Summary (Signed)
[name] MRN: [id number] DOB/AGE: Feb 28, 1991 24 y.o.  Admit date: 07/13/2014 Discharge date: 07/16/2014  Primary Care Physician:  MATTHEWS,MICHELLE A., MD   Discharge Diagnoses:   Patient Active Problem List   Diagnosis Date Noted  . Mood swings 11/08/2011    Priority: High  . Depression 01/06/2011    Priority: High  . Sickle cell disease 01/08/2009    Priority: High  . Anemia 10/05/2012    Priority: Medium  . 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 [redacted] weeks gestation of pregnancy   . Encounter for (NT) nuchal translucency scan   . Supervision of high-risk pregnancy 06/11/2014  . Pharyngitis   . Pregnant 05/03/2014  . Low grade fever 04/29/2014  . Sore throat 04/29/2014  . Pregnancy at early stage 04/29/2014  . Acute bacterial pharyngitis 04/15/2014  . Tonsillar exudate 04/15/2014  . Sickle cell anemia 01/29/2014  . Tobacco dependence 08/30/2013  . Frequent stools 08/27/2013  . Pain, joint, upper arm, right 08/27/2013  . Right groin pain 08/15/2013  . Nausea alone 08/15/2013  . Abscess of right groin 08/15/2013  . Sickle cell crisis 07/31/2013  . Hyperkalemia 07/02/2013  . Foul smelling vaginal discharge 06/19/2013  . OCP (oral contraceptive pills) initiation 06/19/2013  . Unprotected sexual intercourse 06/19/2013  . Leg pain 05/28/2013  . Sickle cell anemia with pain 05/28/2013  . Unspecified constipation 04/25/2013  . 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 Chronic pain 02/26/2013  . Immunization due 02/26/2013  . Protein-calorie malnutrition, severe 02/11/2013  . Streptococcal sore throat 12/31/2012    Class: Acute  . Fever, unspecified 10/06/2012  . Leukocytosis, unspecified 10/05/2012  . Sickle cell anemia with crisis   . Chronic back pain   . Leukocytosis 03/31/2012  . 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 Sickle cell pain crisis 03/29/2012  . Headache 02/13/2012  . Nausea & vomiting 02/13/2012  .  Tachycardia 11/22/2011  . Right thigh pain 11/08/2011  . Migraines 11/08/2011  . Vaso-occlusive sickle cell crisis 10/08/2011  . Overweight(278.02) 05/24/2011  . Stress 04/29/2011  . GERD (gastroesophageal reflux disease) 02/17/2011  . Back pain 09/17/2010  . Active smoker 08/09/2010  . 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     alprazolam 2 MG tablet  Commonly known as:  XANAX  Take 1 tablet by mouth 2 (two) times daily as needed. anxiety     folic acid 1 MG tablet  Commonly known as:  FOLVITE  Take 2 tablets (2 mg total) by mouth daily.     methadone 5 MG tablet  Commonly known as:  DOLOPHINE  Take 1 tablet (5 mg total) by mouth at bedtime.     Oxycodone HCl 10 MG Tabs  Take 1.5 tablets (15 mg total) by mouth every 4 (four) hours as needed (moderate pain).     prenatal multivitamin Tabs tablet  Take 1 tablet by mouth daily at 12 noon.         SIGNIFICANT DIAGNOSTIC STUDIES:  [place] Mfm Fetal Nuchal Translucency  06/17/2014   OBSTETRICAL ULTRASOUND: This exam was performed within a [facility]. The OB US report was generated in the AS system,  and faxed to the ordering physician.   This report is available in the [facility]. See the AS Obstetric US report via the Image Link.     BRIEF ADMITTING H & P: [name] is a 24yo with sickle-cell disease who is currently [redacted] weeks pregnant who initially presented to the ER with symptoms consistent with vaso-occlusive crisis. Pt states that her pain began today in her right foot. She currently rates the pain as a 8/10. She is unable to walk on it, and had to be carried into the car. She tried taking her home meds for it but it didn't help. When she walks on  foot pain worsens and radiates to the knee. She is eating and drinking well. Her last bowel movement was yesterday.    Hospital Course:  Present on Admission:  . Sickle cell anemia with crisis: Pt who is [redacted] weeks GA was admitted for sickle cell crisis. Pt was treated with Dilaudid via PCA and IVF. NSAID's were avoided because of pregnancy. Her pain improved and she was transitioned to oral analgesics. Pt is to follow up this week for labs (Hemoglobinopathy and CBC with diff) to evaluate for partial exchange transfusion.  . Leukocytosis: Pt showed no overt signs of infection. However in light of her pregnancy and the increased risk of asymtomatic bacteuria in pregnancy a urinalysis was performed which showed no evidence of infection.   .  Hypokalemia: This was orally replaced and was within normal limits at time of discharge.  . Constipation: this was treated with Senn-S and Miralax.  Disposition and Follow-up:  Pt to follow up for labs and possible partial exchange transfusion in 3 days     Discharge Instructions    Activity as tolerated - No restrictions    Complete by:  As directed      Diet general    Complete by:  As directed            DISCHARGE EXAM:  General: Alert, awake, oriented x3, in mild distress.  Vital Signs: BP 104/68, pulse 82,T 99 F (37.2 C), temperature source Oral, RR 12, height 5\' 1"  (1.549 m), weight 173 lb 14.4 oz (78.881 kg), last menstrual period 03/20/2014, SpO2 95 %. HEENT: [place]/AT PEERL, EOMI, anicteric Neck: Trachea midline, no masses, no thyromegal,y no JVD, no carotid bruit OROPHARYNX: Moist, No exudate/ erythema/lesions.  Heart: Regular rate and rhythm, without murmurs, rubs, gallops, PMI non-displaced.  Lungs: Clear to auscultation, no wheezing or rhonchi noted.  Abdomen: Soft, nontender, nondistended, positive bowel sounds, no masses no hepatosplenomegaly noted.  Neuro: No focal neurological deficits noted cranial nerves II through XII  grossly intact. Strength at baseline in bilateral upper and lower extremities. Musculoskeletal: No warm swelling or erythema around joints, no spinal tenderness noted. Psychiatric: Patient alert and oriented x3, good insight and cognition, good recent to remote recall. Lymph node survey: No cervical axillary or inguinal lymphadenopathy noted.     Recent Labs  07/14/14 0500 07/15/14 0455 07/15/14 1505  NA 138 138  --   K 3.4* 3.8  --   CL 108 107  --   CO2 23 24  --   GLUCOSE 101* 102*  --   BUN 6 5*  --   CREATININE 0.44* 0.57  --   CALCIUM 8.6 8.7  --   MG  --   --  1.8   No results for input(s): AST, ALT, ALKPHOS, BILITOT, PROT, ALBUMIN in the last 72 hours. No results for input(s): LIPASE,  AMYLASE in the last 72 hours.  Recent Labs  07/14/14 0500 07/15/14 0455  WBC 15.9* 16.2*  NEUTROABS 10.3* 11.8*  HGB 7.9* 8.1*  HCT 23.3* 23.3*  MCV 90.7 91.7  PLT 471* 467*   Total time spent including face to face and decision making was greater than 30 minutes. Signed: MATTHEWS,MICHELLE A. 07/16/2014, 11:29 AM

## 2014-07-16 NOTE — Telephone Encounter (Signed)
Meds ordered this encounter  Medications  . Oxycodone HCl 10 MG TABS    Sig: Take 1.5 tablets (15 mg total) by mouth every 4 (four) hours as needed (moderate pain).    Dispense:  135 tablet    Refill:  0    Order Specific Question:  Supervising Provider    Answer:  Liston Alba A [[id number]]  Will re-start 15 day prescription per Dr. Zigmund Daniel instruction.  Reviewed [place] Substance Reporting system prior to reorder   Dorena Dew, FNP

## 2014-07-17 ENCOUNTER — Ambulatory Visit: Payer: Medicare Other | Admitting: Internal Medicine

## 2014-07-21 ENCOUNTER — Ambulatory Visit (INDEPENDENT_AMBULATORY_CARE_PROVIDER_SITE_OTHER): Payer: Medicare Other | Admitting: Internal Medicine

## 2014-07-21 VITALS — BP 120/74 | HR 96 | Temp 98.6°F | Resp 16 | Ht 62.0 in | Wt 165.0 lb

## 2014-07-21 DIAGNOSIS — T403X5D Adverse effect of methadone, subsequent encounter: Secondary | ICD-10-CM

## 2014-07-21 DIAGNOSIS — D571 Sickle-cell disease without crisis: Secondary | ICD-10-CM | POA: Diagnosis not present

## 2014-07-21 MED ORDER — METHADONE HCL 5 MG PO TABS: 5.0000 mg | ORAL_TABLET | Freq: Every day | ORAL | Status: DC

## 2014-07-22 ENCOUNTER — Encounter: Payer: Self-pay | Admitting: Internal Medicine

## 2014-07-22 ENCOUNTER — Other Ambulatory Visit: Payer: Self-pay | Admitting: Internal Medicine

## 2014-07-22 ENCOUNTER — Encounter: Payer: Self-pay | Admitting: Obstetrics & Gynecology

## 2014-07-22 ENCOUNTER — Ambulatory Visit (INDEPENDENT_AMBULATORY_CARE_PROVIDER_SITE_OTHER): Payer: Medicare Other | Admitting: Obstetrics & Gynecology

## 2014-07-22 VITALS — BP 118/67 | HR 106 | Temp 98.5°F | Wt 165.4 lb

## 2014-07-22 DIAGNOSIS — Z3490 Encounter for supervision of normal pregnancy, unspecified, unspecified trimester: Secondary | ICD-10-CM

## 2014-07-22 DIAGNOSIS — O0992 Supervision of high risk pregnancy, unspecified, second trimester: Secondary | ICD-10-CM

## 2014-07-22 DIAGNOSIS — D571 Sickle-cell disease without crisis: Secondary | ICD-10-CM

## 2014-07-22 LAB — POCT URINALYSIS DIP (DEVICE)
Bilirubin Urine: NEGATIVE
Glucose, UA: NEGATIVE mg/dL
Hgb urine dipstick: NEGATIVE
Ketones, ur: NEGATIVE mg/dL
Leukocytes, UA: NEGATIVE
Nitrite: NEGATIVE
Protein, ur: NEGATIVE mg/dL
Specific Gravity, Urine: 1.02 (ref 1.005–1.030)
Urobilinogen, UA: 1 mg/dL (ref 0.0–1.0)
pH: 5.5 (ref 5.0–8.0)

## 2014-07-22 NOTE — Progress Notes (Signed)
Pt declined genetic testing Requests anatomy scan I spoke with Dr. Zigmund Daniel earlier and she is evaluating pt to see if she needs an exchange transfusion.  Will determine.  She will follow the pts SSC ds this pregnancy Pt may take Benadryl for sleep

## 2014-07-22 NOTE — Progress Notes (Signed)
Pt has question concerning blood transfusion at [facility]

## 2014-07-22 NOTE — Patient Instructions (Signed)
Second Trimester of Pregnancy The second trimester is from week 13 through week 28, months 4 through 6. The second trimester is often a time when you feel your best. Your body has also adjusted to being pregnant, and you begin to feel better physically. Usually, morning sickness has lessened or quit completely, you may have more energy, and you may have an increase in appetite. The second trimester is also a time when the fetus is growing rapidly. At the end of the sixth month, the fetus is about 9 inches long and weighs about 1 pounds. You will likely begin to feel the baby move (quickening) between 18 and 20 weeks of the pregnancy. BODY CHANGES Your body goes through many changes during pregnancy. The changes vary from woman to woman.   Your weight will continue to increase. You will notice your lower abdomen bulging out.  You may begin to get stretch marks on your hips, abdomen, and breasts.  You may develop headaches that can be relieved by medicines approved by your health care provider.  You may urinate more often because the fetus is pressing on your bladder.  You may develop or continue to have heartburn as a result of your pregnancy.  You may develop constipation because certain hormones are causing the muscles that push waste through your intestines to slow down.  You may develop hemorrhoids or swollen, bulging veins (varicose veins).  You may have back pain because of the weight gain and pregnancy hormones relaxing your joints between the bones in your pelvis and as a result of a shift in weight and the muscles that support your balance.  Your breasts will continue to grow and be tender.  Your gums may bleed and may be sensitive to brushing and flossing.  Dark spots or blotches (chloasma, mask of pregnancy) may develop on your face. This will likely fade after the baby is born.  A dark line from your belly button to the pubic area (linea nigra) may appear. This will likely fade  after the baby is born.  You may have changes in your hair. These can include thickening of your hair, rapid growth, and changes in texture. Some women also have hair loss during or after pregnancy, or hair that feels dry or thin. Your hair will most likely return to normal after your baby is born. WHAT TO EXPECT AT YOUR PRENATAL VISITS During a routine prenatal visit:  You will be weighed to make sure you and the fetus are growing normally.  Your blood pressure will be taken.  Your abdomen will be measured to track your baby's growth.  The fetal heartbeat will be listened to.  Any test results from the previous visit will be discussed. Your health care provider may ask you:  How you are feeling.  If you are feeling the baby move.  If you have had any abnormal symptoms, such as leaking fluid, bleeding, severe headaches, or abdominal cramping.  If you have any questions. Other tests that may be performed during your second trimester include:  Blood tests that check for:  Low iron levels (anemia).  Gestational diabetes (between 24 and 28 weeks).  Rh antibodies.  Urine tests to check for infections, diabetes, or protein in the urine.  An ultrasound to confirm the proper growth and development of the baby.  An amniocentesis to check for possible genetic problems.  Fetal screens for spina bifida and Down syndrome. HOME CARE INSTRUCTIONS   Avoid all smoking, herbs, alcohol, and unprescribed   drugs. These chemicals affect the formation and growth of the baby.  Follow your health care provider's instructions regarding medicine use. There are medicines that are either safe or unsafe to take during pregnancy.  Exercise only as directed by your health care provider. Experiencing uterine cramps is a good sign to stop exercising.  Continue to eat regular, healthy meals.  Wear a good support bra for breast tenderness.  Do not use hot tubs, steam rooms, or saunas.  Wear your  seat belt at all times when driving.  Avoid raw meat, uncooked cheese, cat litter boxes, and soil used by cats. These carry germs that can cause birth defects in the baby.  Take your prenatal vitamins.  Try taking a stool softener (if your health care provider approves) if you develop constipation. Eat more high-fiber foods, such as fresh vegetables or fruit and whole grains. Drink plenty of fluids to keep your urine clear or pale yellow.  Take warm sitz baths to soothe any pain or discomfort caused by hemorrhoids. Use hemorrhoid cream if your health care provider approves.  If you develop varicose veins, wear support hose. Elevate your feet for 15 minutes, 3-4 times a day. Limit salt in your diet.  Avoid heavy lifting, wear low heel shoes, and practice good posture.  Rest with your legs elevated if you have leg cramps or low back pain.  Visit your dentist if you have not gone yet during your pregnancy. Use a soft toothbrush to brush your teeth and be gentle when you floss.  A sexual relationship may be continued unless your health care provider directs you otherwise.  Continue to go to all your prenatal visits as directed by your health care provider. SEEK MEDICAL CARE IF:   You have dizziness.  You have mild pelvic cramps, pelvic pressure, or nagging pain in the abdominal area.  You have persistent nausea, vomiting, or diarrhea.  You have a bad smelling vaginal discharge.  You have pain with urination. SEEK IMMEDIATE MEDICAL CARE IF:   You have a fever.  You are leaking fluid from your vagina.  You have spotting or bleeding from your vagina.  You have severe abdominal cramping or pain.  You have rapid weight gain or loss.  You have shortness of breath with chest pain.  You notice sudden or extreme swelling of your face, hands, ankles, feet, or legs.  You have not felt your baby move in over an hour.  You have severe headaches that do not go away with  medicine.  You have vision changes. Document Released: 04/26/2001 Document Revised: 05/07/2013 Document Reviewed: 07/03/2012 Exit[employer] Patient Information 2015 [employer]. This information is not intended to replace advice given to you by your health care provider. Make sure you discuss any questions you have with your health care provider.  

## 2014-07-22 NOTE — Progress Notes (Signed)
Subjective:    Patient ID: [name], female    DOB: 1991-03-21, 24 y.o.   MRN: [id number]  HPI [name] is a patient with Hb SS who is 17 weeks and 4 days GA. 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 She reports that she has been feeling well since her discharge.She is being followed by the [facility] with whom she has an appointment tomorrow.   The last Hemoglobin electrophoresis on 06/11/2014 showed Hb S 52.2%, Hb A 30%, Hb F 15.2%. She was scheduled for a partial exchange transfusion and did not come in for T&C or transfusion. Pt states that she had difficulty arranging transportation and did not realize the importance of this.   I have discussed with patient that the goal is to maintain Hb above 8 g/dl and Hb %<30%. She verbalizes understanding.    Review of Systems  Constitutional: Negative.  Negative for fever, chills, activity change, appetite change and fatigue.  HENT: Negative.  Negative for dental problem, ear pain, hearing loss, sore throat and trouble swallowing.   Eyes: Negative.  Negative for visual disturbance.  Respiratory: Negative.  Negative for cough, chest tightness, shortness of breath and wheezing.   Cardiovascular: Negative.  Negative for chest pain, palpitations and leg swelling.  Gastrointestinal: Negative.  Negative for abdominal pain, diarrhea, constipation, blood in stool, anal bleeding and rectal pain.  Endocrine: Negative.  Negative for cold intolerance, heat intolerance, polydipsia, polyphagia and polyuria.  Genitourinary: Negative.  Negative for dysuria, urgency, frequency, vaginal discharge, genital sores, vaginal pain, menstrual problem and dyspareunia.  Musculoskeletal: Negative.   Skin: Negative.   Allergic/Immunologic: Negative.  Negative for environmental allergies.  Neurological: Negative.  Negative for dizziness, tremors, weakness and headaches.   Hematological: Negative.   Psychiatric/Behavioral: Negative.   All other systems reviewed and are negative.      Objective:   Physical Exam  Constitutional: She is oriented to person, place, and time. She appears well-developed and well-nourished.  HENT:  Head: Normocephalic and atraumatic.  Eyes: Conjunctivae and EOM are normal. Pupils are equal, round, and reactive to light. No scleral icterus.  Neck: Normal range of motion. Neck supple. No JVD present. No thyromegaly present.  Cardiovascular: Normal rate and regular rhythm.  Exam reveals no gallop and no friction rub.   No murmur heard. Pulmonary/Chest: Effort normal and breath sounds normal. She has no wheezes. She has no rales.  Abdominal: Soft. Bowel sounds are normal. She exhibits no distension and no mass. There is no tenderness.  Musculoskeletal: Normal range of motion.  Neurological: She is alert and oriented to person, place, and time.  Psychiatric: She has a normal mood and affect. Her behavior is normal. Judgment and thought content normal.          Assessment & Plan:  1. Hb-SS disease without crisis - Currently patient is off Hydrea and NSAID's  because of her Pregnancy. She is doing well on Methadone and Oxycodone PRN.  - She continues on Folic acid 2 mg daily. -  Her vision examination was < 1 year ago.  - I will obtain a hemoglobin electrophoresis today and patient is to come in for T&C on Wednesday in anticipation of Partial exchange transfusion on Friday. I plan on partial hemoglobin evaluation and partial exchange transfusions every 4-6 weeks to a goal of Hb S< 30% and Hb >8 g/dl. - Hemoglobinopathy evaluation - methadone (DOLOPHINE)  5 MG tablet; Take 1 tablet (5 mg total) by mouth at bedtime.  Dispense: 30 tablet; Refill: 0  2. Methadone adverse reaction, subsequent encounter - Check for QTc prolongation. - EKG 12-Lead   Follow up in 2 days for T&C and possible partial exchange transfusion on Friday.

## 2014-07-23 ENCOUNTER — Non-Acute Institutional Stay (HOSPITAL_COMMUNITY): Admit: 2014-07-23 | Payer: Self-pay | Admitting: Internal Medicine

## 2014-07-23 ENCOUNTER — Non-Acute Institutional Stay (HOSPITAL_COMMUNITY)
Admission: AD | Admit: 2014-07-23 | Discharge: 2014-07-23 | Disposition: A | Discharge status: Home / Self Care | Payer: Medicare Other | Source: Ambulatory Visit | Attending: Internal Medicine | Admitting: Internal Medicine

## 2014-07-23 DIAGNOSIS — Z79891 Long term (current) use of opiate analgesic: Secondary | ICD-10-CM | POA: Diagnosis not present

## 2014-07-23 DIAGNOSIS — Z79899 Other long term (current) drug therapy: Secondary | ICD-10-CM | POA: Diagnosis not present

## 2014-07-23 DIAGNOSIS — D571 Sickle-cell disease without crisis: Secondary | ICD-10-CM

## 2014-07-23 DIAGNOSIS — Z3490 Encounter for supervision of normal pregnancy, unspecified, unspecified trimester: Secondary | ICD-10-CM

## 2014-07-23 DIAGNOSIS — O99012 Anemia complicating pregnancy, second trimester: Secondary | ICD-10-CM | POA: Diagnosis not present

## 2014-07-23 DIAGNOSIS — Z3A17 17 weeks gestation of pregnancy: Secondary | ICD-10-CM | POA: Insufficient documentation

## 2014-07-23 LAB — HEMOGLOBINOPATHY EVALUATION
Hemoglobin Other: 0 %
Hgb A2 Quant: 3 % (ref 2.2–3.2)
Hgb A: 10.7 % — ABNORMAL LOW (ref 96.8–97.8)
Hgb F Quant: 14.2 % — ABNORMAL HIGH (ref 0.0–2.0)
Hgb S Quant: 72.1 % — ABNORMAL HIGH

## 2014-07-23 MED ORDER — SODIUM CHLORIDE 0.9 % IV SOLN: Freq: Once | INTRAVENOUS | Status: DC

## 2014-07-23 NOTE — Progress Notes (Signed)
Pt arrived for type and screen, exchange transfusion, and blood transfusion; pt states cannot stay today but will return on Friday for transfusion; type and screen done; pt blood band in place; pt notified to keep band on until she arrives on Friday; pt verbalizes understanding

## 2014-07-24 ENCOUNTER — Telehealth: Payer: Self-pay | Admitting: Internal Medicine

## 2014-07-24 NOTE — Telephone Encounter (Signed)
Refill request for oxycodone 10mg . LOV 07/22/2014. Please advise. Thanks!

## 2014-07-25 ENCOUNTER — Telehealth (HOSPITAL_COMMUNITY): Payer: Self-pay | Admitting: Internal Medicine

## 2014-07-25 ENCOUNTER — Other Ambulatory Visit: Payer: Self-pay | Admitting: Internal Medicine

## 2014-07-25 ENCOUNTER — Telehealth: Payer: Self-pay | Admitting: Internal Medicine

## 2014-07-25 ENCOUNTER — Encounter: Payer: Self-pay | Admitting: Certified Registered Nurse Anesthetist

## 2014-07-25 ENCOUNTER — Non-Acute Institutional Stay (HOSPITAL_COMMUNITY)
Admission: AD | Admit: 2014-07-25 | Discharge: 2014-07-25 | Disposition: A | Discharge status: Home / Self Care | Payer: Medicare Other | Attending: Internal Medicine | Admitting: Internal Medicine

## 2014-07-25 DIAGNOSIS — D571 Sickle-cell disease without crisis: Secondary | ICD-10-CM | POA: Insufficient documentation

## 2014-07-25 DIAGNOSIS — Z3A17 17 weeks gestation of pregnancy: Secondary | ICD-10-CM | POA: Insufficient documentation

## 2014-07-25 DIAGNOSIS — O99012 Anemia complicating pregnancy, second trimester: Secondary | ICD-10-CM | POA: Diagnosis not present

## 2014-07-25 DIAGNOSIS — D5700 Hb-SS disease with crisis, unspecified: Secondary | ICD-10-CM

## 2014-07-25 DIAGNOSIS — Z79899 Other long term (current) drug therapy: Secondary | ICD-10-CM | POA: Insufficient documentation

## 2014-07-25 DIAGNOSIS — Z79891 Long term (current) use of opiate analgesic: Secondary | ICD-10-CM | POA: Insufficient documentation

## 2014-07-25 LAB — CBC WITH DIFFERENTIAL/PLATELET
BASOS ABS: 0 10*3/uL (ref 0.0–0.1)
Basophils Relative: 0 % (ref 0–1)
EOS ABS: 0.2 10*3/uL (ref 0.0–0.7)
Eosinophils Relative: 1 % (ref 0–5)
HEMATOCRIT: 25.6 % — AB (ref 36.0–46.0)
HEMOGLOBIN: 9 g/dL — AB (ref 12.0–15.0)
Lymphocytes Relative: 10 % — ABNORMAL LOW (ref 12–46)
Lymphs Abs: 1.8 10*3/uL (ref 0.7–4.0)
MCH: 31.9 pg (ref 26.0–34.0)
MCHC: 35.2 g/dL (ref 30.0–36.0)
MCV: 90.8 fL (ref 78.0–100.0)
MONO ABS: 1.3 10*3/uL — AB (ref 0.1–1.0)
MONOS PCT: 7 % (ref 3–12)
NEUTROS ABS: 14.6 10*3/uL — AB (ref 1.7–7.7)
NEUTROS PCT: 82 % — AB (ref 43–77)
Platelets: 447 10*3/uL — ABNORMAL HIGH (ref 150–400)
RBC: 2.82 MIL/uL — ABNORMAL LOW (ref 3.87–5.11)
RDW: 16.8 % — AB (ref 11.5–15.5)
WBC: 17.9 10*3/uL — ABNORMAL HIGH (ref 4.0–10.5)

## 2014-07-25 MED ORDER — SODIUM CHLORIDE 0.9 % IV SOLN
INTRAVENOUS | Status: DC
  Administered 2014-07-25: 11:00:00 via INTRAVENOUS

## 2014-07-25 MED ORDER — SODIUM CHLORIDE 0.9 % IV SOLN: Freq: Once | INTRAVENOUS | Status: DC

## 2014-07-25 NOTE — Progress Notes (Signed)
Pt arrived at day hospital for blood transfusion and exchange. CBC obtained; MD notified; will continue to monitor

## 2014-07-25 NOTE — Progress Notes (Unsigned)
Pt. In [facility] for exchange today.  20g angiocath, with empty IV bag and secondary method used for therapeutic phlebotomy today. 500 g phlebotomized via lt. Lateral angiocatheter. Pt. tolerated procedure well.  C/o'ed of mild "lightheadedness" post procedure. Report given to [facility] RN. IV line flushed and capped for blood transfusion. HL

## 2014-07-25 NOTE — Telephone Encounter (Signed)
Pt called and states having pain in back; rates pain 7/10; denies CP, SOB, nausea, vomiting, diarrhea, abdominal pain; states experiencing no relief from home pain medications; MD notified

## 2014-07-25 NOTE — Procedures (Signed)
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

## 2014-07-25 NOTE — Progress Notes (Signed)
Pt received therapeutic phlebotomy by Bonnita Nasuti, RN and Maggie, RN from cancer center; pt alert, oriented, no complications noted; will continue to monitor

## 2014-07-25 NOTE — Telephone Encounter (Signed)
Pt with c/o pin in haer back more than usual. SHe tninkks that it may be related to her pregnancy. She was seen by her Ob-Gyn on 07/22/2014 and made them aware of her pain. She reports that she was told by the Obstetrician that it should improve with the exchange transfusion.Pt was supposed to get an exchange transfusion 2 days ago on 07/23/2014 however she declined since she did not have time on that day .She is scheduled for an exchange transfusion this morning. I have advised patient to take a dose of Methadone 5 mg x 1 this morning and take her usual dose of Oxycodone 15 mg q 4 hours as prescribed. Pt reports that she took 20 mg of oxycodone instead of 15 mg last night at 9:00 pm. I anticipate that she will have improvement after exchange transfusion.

## 2014-07-25 NOTE — Telephone Encounter (Signed)
Oxycodone not due until 07/31/2014.

## 2014-07-27 LAB — TYPE AND SCREEN
ABO/RH(D): B POS
Antibody Screen: POSITIVE
DAT, IgG: NEGATIVE
DONOR AG TYPE: NEGATIVE
Donor AG Type: NEGATIVE
Unit division: 0
Unit division: 0

## 2014-07-29 ENCOUNTER — Ambulatory Visit (HOSPITAL_COMMUNITY)
Admission: RE | Admit: 2014-07-29 | Discharge: 2014-07-29 | Disposition: A | Discharge status: Home / Self Care | Payer: Medicare Other | Source: Ambulatory Visit | Attending: Obstetrics and Gynecology | Admitting: Obstetrics and Gynecology

## 2014-07-29 ENCOUNTER — Telehealth: Payer: Self-pay

## 2014-07-29 DIAGNOSIS — Z3A18 18 weeks gestation of pregnancy: Secondary | ICD-10-CM | POA: Insufficient documentation

## 2014-07-29 DIAGNOSIS — Z36 Encounter for antenatal screening of mother: Secondary | ICD-10-CM | POA: Insufficient documentation

## 2014-07-29 DIAGNOSIS — Z3689 Encounter for other specified antenatal screening: Secondary | ICD-10-CM | POA: Insufficient documentation

## 2014-07-29 DIAGNOSIS — D571 Sickle-cell disease without crisis: Secondary | ICD-10-CM

## 2014-07-29 DIAGNOSIS — G8929 Other chronic pain: Secondary | ICD-10-CM

## 2014-07-29 DIAGNOSIS — IMO0002 Reserved for concepts with insufficient information to code with codable children: Secondary | ICD-10-CM

## 2014-07-29 DIAGNOSIS — Z0489 Encounter for examination and observation for other specified reasons: Secondary | ICD-10-CM

## 2014-07-29 MED ORDER — OXYCODONE HCL 15 MG PO TABS: 15.0000 mg | ORAL_TABLET | ORAL | Status: DC | PRN

## 2014-07-29 NOTE — Telephone Encounter (Signed)
Prescription written for oxycodone 15 mg #90 tabs. NCCSRS reviewed and no inconsistencies noted.

## 2014-07-29 NOTE — Telephone Encounter (Signed)
Refill request for oxycodone 10mg . LOV 07/22/2014. Please advise. Thanks!

## 2014-08-08 IMAGING — CR DG CHEST 1V
1 series · 1 of 1 positions shown · non-contrast
Comparison: 07/30/1947

CLINICAL DATA: Chest pain, sickle cell disease

EXAM:
CHEST - 1 VIEW

[x chest ap]
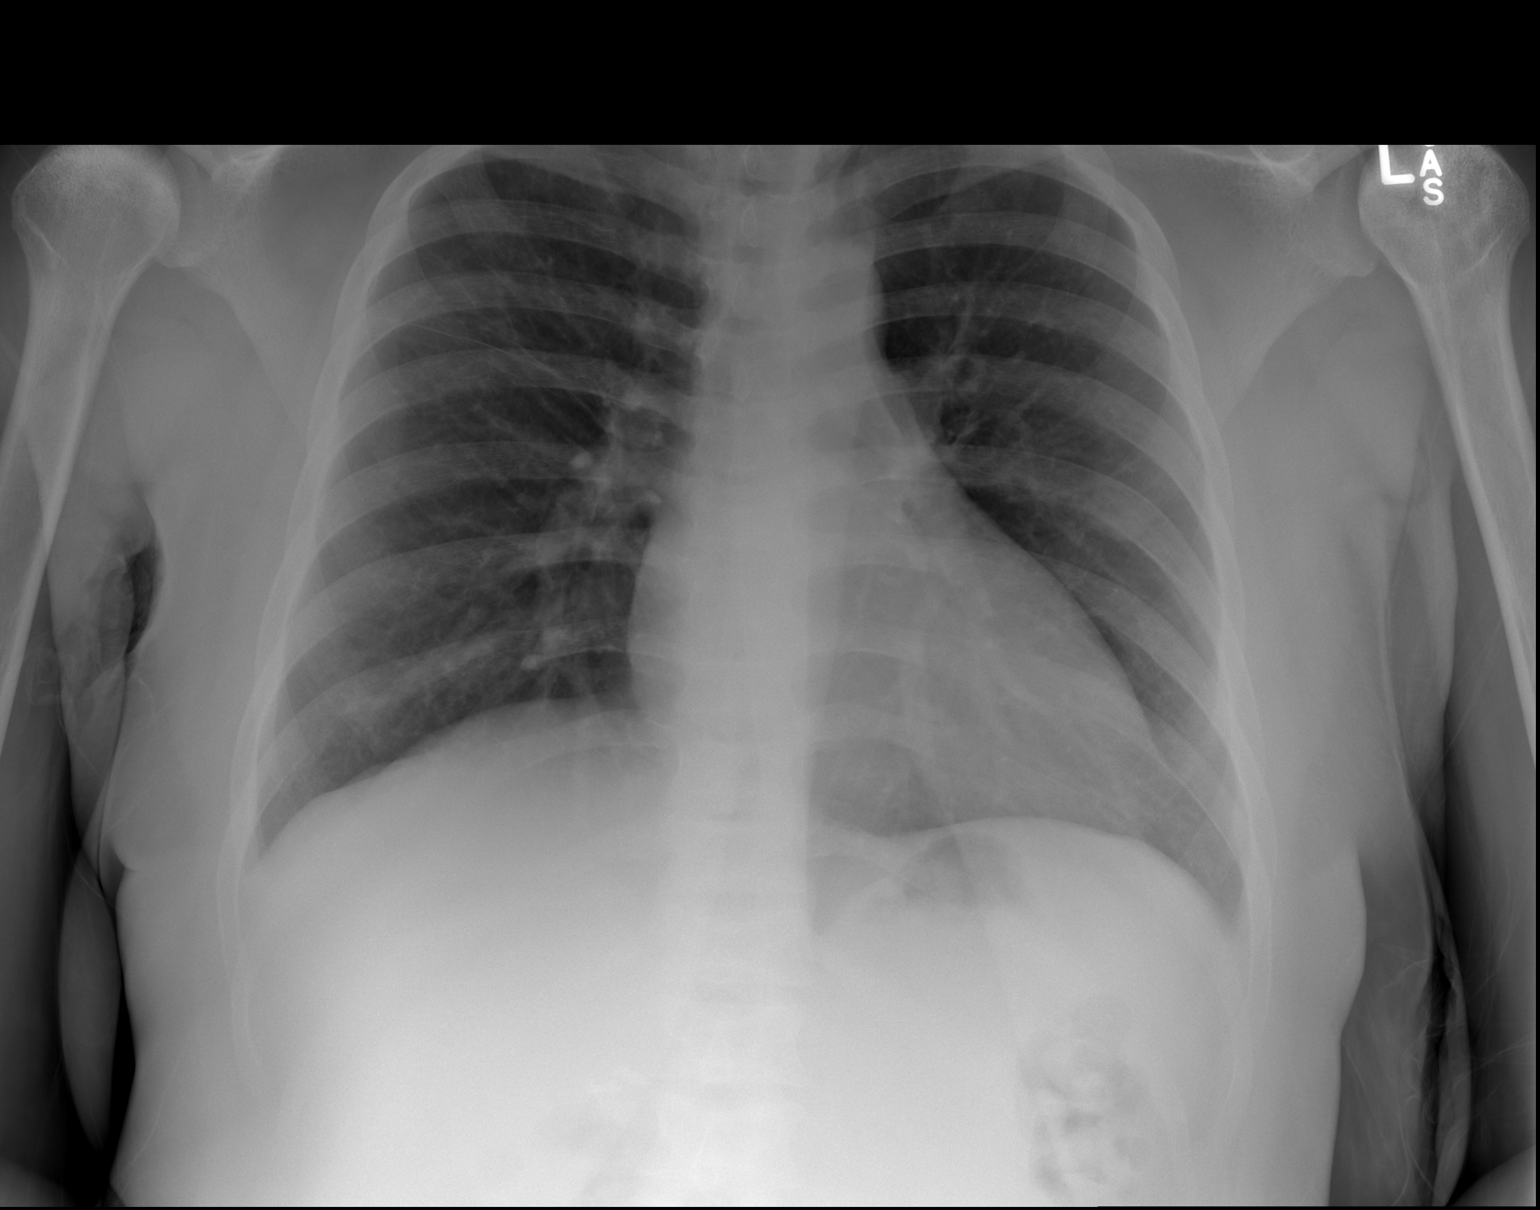

[1 of 1 positions shown; findings below may reference images not displayed]

FINDINGS: Cardiac shadow is within normal limits. The lungs are clear
bilaterally. No focal infiltrate or sizable effusion is seen no
acute bony abnormality is noted.
IMPRESSION: No active disease.

## 2014-08-09 ENCOUNTER — Emergency Department (HOSPITAL_COMMUNITY): Payer: Medicare Other

## 2014-08-09 ENCOUNTER — Inpatient Hospital Stay (HOSPITAL_COMMUNITY)
Admission: EM | Admit: 2014-08-09 | Discharge: 2014-08-15 | DRG: 781 | Disposition: A | Discharge status: Home / Self Care | Payer: Medicare Other | Attending: Internal Medicine | Admitting: Internal Medicine

## 2014-08-09 ENCOUNTER — Encounter (HOSPITAL_COMMUNITY): Payer: Self-pay | Admitting: *Deleted

## 2014-08-09 DIAGNOSIS — R509 Fever, unspecified: Secondary | ICD-10-CM | POA: Diagnosis present

## 2014-08-09 DIAGNOSIS — K5900 Constipation, unspecified: Secondary | ICD-10-CM | POA: Diagnosis present

## 2014-08-09 DIAGNOSIS — K219 Gastro-esophageal reflux disease without esophagitis: Secondary | ICD-10-CM | POA: Diagnosis not present

## 2014-08-09 DIAGNOSIS — Z833 Family history of diabetes mellitus: Secondary | ICD-10-CM | POA: Diagnosis not present

## 2014-08-09 DIAGNOSIS — Z3A21 21 weeks gestation of pregnancy: Secondary | ICD-10-CM | POA: Diagnosis present

## 2014-08-09 DIAGNOSIS — D5700 Hb-SS disease with crisis, unspecified: Secondary | ICD-10-CM | POA: Diagnosis present

## 2014-08-09 DIAGNOSIS — Z87891 Personal history of nicotine dependence: Secondary | ICD-10-CM | POA: Diagnosis not present

## 2014-08-09 DIAGNOSIS — O09892 Supervision of other high risk pregnancies, second trimester: Secondary | ICD-10-CM | POA: Diagnosis not present

## 2014-08-09 DIAGNOSIS — Z8249 Family history of ischemic heart disease and other diseases of the circulatory system: Secondary | ICD-10-CM | POA: Diagnosis not present

## 2014-08-09 DIAGNOSIS — D72829 Elevated white blood cell count, unspecified: Secondary | ICD-10-CM | POA: Diagnosis present

## 2014-08-09 DIAGNOSIS — O99012 Anemia complicating pregnancy, second trimester: Secondary | ICD-10-CM | POA: Diagnosis not present

## 2014-08-09 LAB — CBC WITH DIFFERENTIAL/PLATELET
Basophils Absolute: 0 10*3/uL (ref 0.0–0.1)
Basophils Relative: 0 % (ref 0–1)
Eosinophils Absolute: 0.2 10*3/uL (ref 0.0–0.7)
Eosinophils Relative: 1 % (ref 0–5)
HCT: 26.1 % — ABNORMAL LOW (ref 36.0–46.0)
Hemoglobin: 9 g/dL — ABNORMAL LOW (ref 12.0–15.0)
Lymphocytes Relative: 14 % (ref 12–46)
Lymphs Abs: 2.1 10*3/uL (ref 0.7–4.0)
MCH: 31.4 pg (ref 26.0–34.0)
MCHC: 34.5 g/dL (ref 30.0–36.0)
MCV: 90.9 fL (ref 78.0–100.0)
Monocytes Absolute: 1.1 10*3/uL — ABNORMAL HIGH (ref 0.1–1.0)
Monocytes Relative: 8 % (ref 3–12)
Neutro Abs: 11.5 10*3/uL — ABNORMAL HIGH (ref 1.7–7.7)
Neutrophils Relative %: 77 % (ref 43–77)
Platelets: 479 10*3/uL — ABNORMAL HIGH (ref 150–400)
RBC: 2.87 MIL/uL — ABNORMAL LOW (ref 3.87–5.11)
RDW: 16.3 % — ABNORMAL HIGH (ref 11.5–15.5)
WBC: 14.9 10*3/uL — ABNORMAL HIGH (ref 4.0–10.5)

## 2014-08-09 LAB — COMPREHENSIVE METABOLIC PANEL
ALT: 23 U/L (ref 0–35)
AST: 25 U/L (ref 0–37)
Albumin: 3.6 g/dL (ref 3.5–5.2)
Alkaline Phosphatase: 50 U/L (ref 39–117)
Anion gap: 7 (ref 5–15)
BUN: 5 mg/dL — ABNORMAL LOW (ref 6–23)
CO2: 23 mmol/L (ref 19–32)
Calcium: 8.7 mg/dL (ref 8.4–10.5)
Chloride: 105 mmol/L (ref 96–112)
Creatinine, Ser: 0.5 mg/dL (ref 0.50–1.10)
GFR calc Af Amer: >90 mL/min (ref >90)
GFR calc non Af Amer: >90 mL/min (ref >90)
Glucose, Bld: 77 mg/dL (ref 70–99)
Potassium: 3.8 mmol/L (ref 3.5–5.1)
Sodium: 135 mmol/L (ref 135–145)
Total Bilirubin: 2.1 mg/dL — ABNORMAL HIGH (ref 0.3–1.2)
Total Protein: 7.1 g/dL (ref 6.0–8.3)

## 2014-08-09 LAB — RETICULOCYTES
RBC.: 2.87 MIL/uL — ABNORMAL LOW (ref 3.87–5.11)
Retic Count, Absolute: 513.7 10*3/uL — ABNORMAL HIGH (ref 19.0–186.0)
Retic Ct Pct: 17.9 % — ABNORMAL HIGH (ref 0.4–3.1)

## 2014-08-09 MED ORDER — HYDROMORPHONE 0.3 MG/ML IV SOLN
INTRAVENOUS | Status: DC
  Administered 2014-08-09: 23:00:00 via INTRAVENOUS
  Administered 2014-08-10: 1.99 mg via INTRAVENOUS
  Filled 2014-08-09 (×2): qty 25

## 2014-08-09 MED ORDER — CALCIUM CARBONATE ANTACID 500 MG PO CHEW
2.0000 | CHEWABLE_TABLET | ORAL | Status: DC | PRN
  Administered 2014-08-12 – 2014-08-14 (×2): 400 mg via ORAL
  Filled 2014-08-09 (×2): qty 1

## 2014-08-09 MED ORDER — PRENATAL MULTIVITAMIN CH
1.0000 | ORAL_TABLET | Freq: Every day | ORAL | Status: DC
  Administered 2014-08-10 – 2014-08-15 (×6): 1 via ORAL
  Filled 2014-08-09 (×6): qty 1

## 2014-08-09 MED ORDER — NALOXONE HCL 0.4 MG/ML IJ SOLN: 0.4000 mg | INTRAMUSCULAR | Status: DC | PRN

## 2014-08-09 MED ORDER — DOCUSATE SODIUM 100 MG PO CAPS
100.0000 mg | ORAL_CAPSULE | Freq: Every day | ORAL | Status: DC
Start: 2014-08-09 — End: 2014-08-12
  Administered 2014-08-10 – 2014-08-12 (×3): 100 mg via ORAL
  Filled 2014-08-09 (×3): qty 1

## 2014-08-09 MED ORDER — ENOXAPARIN SODIUM 40 MG/0.4ML ~~LOC~~ SOLN
40.0000 mg | SUBCUTANEOUS | Status: DC
  Administered 2014-08-09 – 2014-08-14 (×5): 40 mg via SUBCUTANEOUS
  Filled 2014-08-09 (×8): qty 0.4

## 2014-08-09 MED ORDER — FOLIC ACID 1 MG PO TABS
2.0000 mg | ORAL_TABLET | Freq: Every day | ORAL | Status: DC
  Administered 2014-08-09 – 2014-08-15 (×7): 2 mg via ORAL
  Filled 2014-08-09 (×9): qty 2

## 2014-08-09 MED ORDER — SODIUM CHLORIDE 0.9 % IJ SOLN: 9.0000 mL | INTRAMUSCULAR | Status: DC | PRN

## 2014-08-09 MED ORDER — HYDROMORPHONE HCL 2 MG/ML IJ SOLN
2.0000 mg | Freq: Once | INTRAMUSCULAR | Status: AC
Start: 2014-08-09 — End: 2014-08-09
  Administered 2014-08-09: 2 mg via INTRAVENOUS
  Filled 2014-08-09: qty 1

## 2014-08-09 MED ORDER — LACTATED RINGERS IV SOLN
INTRAVENOUS | Status: DC
Start: 2014-08-09 — End: 2014-08-12
  Administered 2014-08-09 – 2014-08-12 (×11): via INTRAVENOUS
  Administered 2014-08-12: 125 mL/h via INTRAVENOUS
  Administered 2014-08-12: 02:00:00 via INTRAVENOUS

## 2014-08-09 MED ORDER — ONDANSETRON HCL 4 MG/2ML IJ SOLN
4.0000 mg | Freq: Once | INTRAMUSCULAR | Status: AC
Start: 2014-08-09 — End: 2014-08-09
  Administered 2014-08-09: 4 mg via INTRAVENOUS
  Filled 2014-08-09: qty 2

## 2014-08-09 MED ORDER — DIPHENHYDRAMINE HCL 12.5 MG/5ML PO ELIX
12.5000 mg | ORAL_SOLUTION | Freq: Four times a day (QID) | ORAL | Status: DC | PRN
  Filled 2014-08-09: qty 5

## 2014-08-09 MED ORDER — HYDROMORPHONE HCL 2 MG/ML IJ SOLN
2.0000 mg | Freq: Once | INTRAMUSCULAR | Status: AC
  Administered 2014-08-09: 2 mg via INTRAVENOUS
  Filled 2014-08-09: qty 1

## 2014-08-09 MED ORDER — ONDANSETRON HCL 4 MG/2ML IJ SOLN: 4.0000 mg | Freq: Four times a day (QID) | INTRAMUSCULAR | Status: DC | PRN

## 2014-08-09 MED ORDER — ALPRAZOLAM 1 MG PO TABS: 2.0000 mg | ORAL_TABLET | Freq: Two times a day (BID) | ORAL | Status: DC | PRN

## 2014-08-09 MED ORDER — METHADONE HCL 5 MG PO TABS
5.0000 mg | ORAL_TABLET | Freq: Every day | ORAL | Status: DC
  Administered 2014-08-09 – 2014-08-14 (×6): 5 mg via ORAL
  Filled 2014-08-09 (×7): qty 1

## 2014-08-09 MED ORDER — DIPHENHYDRAMINE HCL 50 MG/ML IJ SOLN
25.0000 mg | Freq: Once | INTRAMUSCULAR | Status: AC
  Administered 2014-08-09: 25 mg via INTRAVENOUS
  Filled 2014-08-09: qty 1

## 2014-08-09 MED ORDER — HYDROMORPHONE HCL 2 MG/ML IJ SOLN: 2.0000 mg | Freq: Once | INTRAMUSCULAR | Status: DC

## 2014-08-09 MED ORDER — ACETAMINOPHEN 325 MG PO TABS
650.0000 mg | ORAL_TABLET | ORAL | Status: DC | PRN
  Administered 2014-08-10 – 2014-08-12 (×2): 650 mg via ORAL
  Filled 2014-08-09 (×3): qty 2

## 2014-08-09 MED ORDER — OXYCODONE HCL 5 MG PO TABS
15.0000 mg | ORAL_TABLET | ORAL | Status: DC | PRN
  Administered 2014-08-10: 15 mg via ORAL
  Filled 2014-08-09: qty 3

## 2014-08-09 MED ORDER — DIPHENHYDRAMINE HCL 50 MG/ML IJ SOLN: 12.5000 mg | Freq: Four times a day (QID) | INTRAMUSCULAR | Status: DC | PRN

## 2014-08-09 MED ORDER — LACTATED RINGERS IV BOLUS (SEPSIS)
1000.0000 mL | Freq: Once | INTRAVENOUS | Status: AC
  Administered 2014-08-09: 1000 mL via INTRAVENOUS

## 2014-08-09 NOTE — ED Notes (Signed)
[place] sandwich, apple juice given

## 2014-08-09 NOTE — H&P (Signed)
[name] is a 24 y.o. female presenting for sickle cell crisis. She states that she started having pain earlier today in her upper back, shoulder and left arm. She states that she has been drinking a lot of water, and trying to exercise. She is unsure what may have lead to this crisis. She speculates that it could have been from going out in the cold without a jacket or sweater.   She is also concerned about a vaginal discharge. She states that she has had a white, odorous discharge x 2 weeks. She states that initially it was itchy, but it is no longer itchy. She had one episode of spotting when she wiped 2 days ago, but none since.    Maternal Medical History:  Reason for admission: Nausea.   Prenatal complications: Sickle cell     OB History    Gravida Para Term Preterm AB TAB SAB Ectopic Multiple Living   2    1  1          Obstetric Comments   Miscarried in October 2012 at about 7 weeks     Past Medical History  Diagnosis Date  . H/O: 1 miscarriage 03/22/2011    Pt reports 2 miscarriages.  [name] 01/08/2009  . Depression 01/06/2011  . GERD (gastroesophageal reflux disease) 02/17/2011  . Trichotillomania     h/o  . Blood transfusion     "lots"  . Sickle cell anemia with crisis   . Exertional dyspnea     "sometimes"  . Sickle cell anemia   . Headache(784.0)   . Migraines 11/08/11    "@ least twice/month"  . Chronic back pain     "very severe; have knot in my back; from tight muscle; take RX and exercise for it"  . Mood swings 11/08/11    "I go back and forth; real bad"  . Pregnancy   . Blood dyscrasia     SICKLE CELL   Past Surgical History  Procedure Laterality Date  . Cholecystectomy  05/2010  . Dilation and curettage of uterus  02/20/11    S/P miscarriage   Family History: family history includes Alcoholism in an other family member; Depression in an other family member; Diabetes in her maternal grandfather, maternal grandmother, and paternal  grandmother; Hypercholesterolemia in an other family member; Hypertension in an other family member; Migraines in an other family member; Sickle cell trait in her father and mother. Social History:  reports that she quit smoking about 16 months ago. Her smoking use included Cigarettes. She has a .25 pack-year smoking history. She has never used smokeless tobacco. She reports that she does not drink alcohol or use illicit drugs.   Prenatal Transfer Tool  Maternal Diabetes: No to early for testing at this time.  Genetic Screening: Normal Maternal Ultrasounds/Referrals: Normal Fetal Ultrasounds or other Referrals:  None Maternal Substance Abuse:  Yes:  Type: Prescription drugs, Methadone Significant Maternal Medications:  Meds include: Other:  Significant Maternal Lab Results:  None Other Comments:  Sickle cell   Review of Systems  Constitutional: Negative for fever.  Gastrointestinal: Positive for nausea. Negative for vomiting, abdominal pain, diarrhea and constipation.  Genitourinary: Negative for dysuria, urgency and frequency.  Musculoskeletal: Positive for joint pain and neck pain.      Blood pressure 102/66, pulse 91, temperature 97.5 F (36.4 C), temperature source Oral, resp. rate 18, last menstrual period 03/20/2014, SpO2 95 %. Exam Physical Exam  Nursing note and vitals reviewed. Constitutional: She is  oriented to person, place, and time. She appears well-developed and well-nourished. No distress.  Cardiovascular: Normal rate.   Respiratory: Effort normal.  GI: Soft.  Musculoskeletal: She exhibits tenderness (in left arm and upper back. ).  Decreased ROM in left arm.   Neurological: She is alert and oriented to person, place, and time.  Skin: Skin is warm and dry.  Psychiatric: She has a normal mood and affect.    Prenatal labs: ABO, Rh: --/--/B POS (03/09 0932) Antibody: POS (03/09 0932) Rubella: 0.69 (01/27 1457) RPR: NON REAC (01/27 1457)  HBsAg: NEGATIVE (01/27  1457)  HIV: NONREACTIVE (12/21 1550)  GBS:     Assessment/Plan: Sickle cell pain crisis Admit to ante for pain management Dilaudid PCA    Mathis Bud 08/09/2014, 10:17 PM

## 2014-08-09 NOTE — ED Notes (Signed)
Heat packs given

## 2014-08-09 NOTE — ED Provider Notes (Signed)
CSN: [id number]     Arrival date & time 08/09/14  1321 History   First MD Initiated Contact with Patient 08/09/14 1456     Chief Complaint  Patient presents with  . Sickle Cell Pain Crisis     (Consider location/radiation/quality/duration/timing/severity/associated sxs/prior Treatment) HPI   23yF with L shoulder pain. Pain in L neck/L upper back/L shoulder. Gradual onset last night. Pt has has of sickle cell anemia. Reports symptoms feel like pain crisis. Denies trauma. Worse with movement. No rash or swelling. No respiratory symptoms. Has been taking home meds which include methadone and oxycodone w/o much relief. Pt is currently pregnant. [redacted]w[redacted]d GA. Denies abdominal pain. Reports recent OB appointment and things have been going well in terms of her pregnancy. Temp noted to be 100.1. Pt denies feeling febrile or having chills. No cough.   Past Medical History  Diagnosis Date  . H/O: 1 miscarriage 03/22/2011    Pt reports 2 miscarriages.  [name] 01/08/2009  . Depression 01/06/2011  . GERD (gastroesophageal reflux disease) 02/17/2011  . Trichotillomania     h/o  . Blood transfusion     "lots"  . Sickle cell anemia with crisis   . Exertional dyspnea     "sometimes"  . Sickle cell anemia   . Headache(784.0)   . Migraines 11/08/11    "@ least twice/month"  . Chronic back pain     "very severe; have knot in my back; from tight muscle; take RX and exercise for it"  . Mood swings 11/08/11    "I go back and forth; real bad"  . Pregnancy   . Blood dyscrasia     SICKLE CELL   Past Surgical History  Procedure Laterality Date  . Cholecystectomy  05/2010  . Dilation and curettage of uterus  02/20/11    S/P miscarriage   Family History  Problem Relation Age of Onset  . Alcoholism    . Depression    . Hypercholesterolemia    . Hypertension    . Migraines    . Diabetes Maternal Grandmother   . Diabetes Paternal Grandmother   . Diabetes Maternal Grandfather   . Sickle cell  trait Mother   . Sickle cell trait Father    History  Substance Use Topics  . Smoking status: Former Smoker -- 0.25 packs/day for 1 years    Types: Cigarettes    Quit date: 03/25/2013  . Smokeless tobacco: Never Used  . Alcohol Use: No     Comment: pt states she quit marijuan in May 2013. Rare ETOH, + cigarettes.  She is enrolled in school   OB History    Gravida Para Term Preterm AB TAB SAB Ectopic Multiple Living   2    1  1          Obstetric Comments   Miscarried in October 2012 at about 7 weeks     Review of Systems  All systems reviewed and negative, other than as noted in HPI.   Allergies  Carrot oil and Latex  Home Medications   Prior to Admission medications   Medication Sig Start Date End Date Taking? Authorizing Provider  acetaminophen (TYLENOL) 500 MG tablet Take 1,000 mg by mouth every 6 (six) hours as needed for moderate pain or headache.   Yes Historical Provider, MD  alprazolam Duanne Moron) 2 MG tablet Take 1 tablet by mouth 2 (two) times daily as needed. anxiety 04/29/14  Yes Historical Provider, MD  folic acid (FOLVITE) 1 MG tablet Take  2 tablets (2 mg total) by mouth daily. Patient taking differently: Take 1 mg by mouth daily.  04/24/14  Yes Dorena Dew, FNP  methadone (DOLOPHINE) 5 MG tablet Take 1 tablet (5 mg total) by mouth at bedtime. 07/21/14  Yes [name], MD  oxyCODONE (ROXICODONE) 15 MG immediate release tablet Take 1 tablet (15 mg total) by mouth every 4 (four) hours as needed for pain. 07/29/14  Yes [name], MD  Prenatal Vit-Fe Fumarate-FA (PRENATAL MULTIVITAMIN) TABS tablet Take 1 tablet by mouth daily at 12 noon.   Yes Historical Provider, MD   BP 111/67 mmHg  Pulse 87  Temp(Src) 100.1 F (37.8 C) (Oral)  Resp 16  SpO2 99%  LMP 03/20/2014 Physical Exam  Constitutional: She appears well-developed and well-nourished. No distress.  HENT:  Head: Normocephalic and atraumatic.  Eyes: Conjunctivae are normal. Right eye  exhibits no discharge. Left eye exhibits no discharge.  Neck: Neck supple.  Cardiovascular: Normal rate, regular rhythm and normal heart sounds.  Exam reveals no gallop and no friction rub.   No murmur heard. Pulmonary/Chest: Effort normal and breath sounds normal. No respiratory distress.  Abdominal: Soft. She exhibits no distension. There is no tenderness.  Musculoskeletal: She exhibits no edema or tenderness.  L shoulder grossly normal in appearance and symmetric as compared to R. TTP L neck, upper trap and shoulder. NVI distally.   Neurological: She is alert.  Skin: Skin is warm and dry.  Psychiatric: She has a normal mood and affect. Her behavior is normal. Thought content normal.  Nursing note and vitals reviewed.   ED Course  Procedures (including critical care time) Labs Review Labs Reviewed  CBC WITH DIFFERENTIAL/PLATELET - Abnormal; Notable for the following:    WBC 14.9 (*)    RBC 2.87 (*)    Hemoglobin 9.0 (*)    HCT 26.1 (*)    RDW 16.3 (*)    Platelets 479 (*)    Neutro Abs 11.5 (*)    Monocytes Absolute 1.1 (*)    All other components within normal limits  COMPREHENSIVE METABOLIC PANEL - Abnormal; Notable for the following:    BUN 5 (*)    Total Bilirubin 2.1 (*)    All other components within normal limits  RETICULOCYTES - Abnormal; Notable for the following:    Retic Ct Pct 17.9 (*)    RBC. 2.87 (*)    Retic Count, Manual 513.7 (*)    All other components within normal limits    Imaging Review No results found.   Dg Chest 2 View  08/09/2014   CLINICAL DATA:  Fever, [redacted] weeks pregnant  EXAM: CHEST  2 VIEW  COMPARISON:  04/15/2014  FINDINGS: The heart size and mediastinal contours are within normal limits. Both lungs are clear with the exception of streaky linear upper lobe scarring reidentified. 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 Central vertebral body compression  deformities are also compatible with sickle cell disease. Cholecystectomy clips are noted.  IMPRESSION: No active cardiopulmonary disease.   Electronically Signed   By: Conchita Paris M.D.   On: 08/09/2014 16:38   US Ob Detail + 14 Wk  07/29/2014   OBSTETRICAL ULTRASOUND: This exam was performed within a [facility]. The OB US report was generated in the AS system, and faxed to the ordering physician.   This report is available in the [facility]. See the [facility] Obstetric  US report via the Image Link.    EKG Interpretation None      MDM   Final diagnoses:  Fever  Sickle cell pain crisis    23yF with atraumatic L shoulder pain. Hx of Hb-SS and suspect crisis. Temp 100.1 but denies infectious symptoms. Appears well. No CP, cough or SOB. Pain with ROM of L shoulder but can actively range and doubt septic joint. H/H stable. [redacted]w[redacted]d pregnant. Followed in high risk clinic. No acute concerns regarding this. Reports typical regimen dilaudid, benadryl, zofran, toradol. NSAIDs deferred with current pregnancy.   Pain remains poorly controlled despite numerous doses of pain medication. Admission.   Virgel Manifold, MD 08/11/14 [id number]

## 2014-08-09 NOTE — ED Notes (Signed)
Called [facility] for [facility], she will call back with a bed placement.

## 2014-08-09 NOTE — ED Notes (Addendum)
Heat packs given again

## 2014-08-09 NOTE — ED Notes (Addendum)
Pt reports [place] pain since yesterday. Upper back and shoulder pain. Has taken methadone last night and oxycodone this am. Denies sob, chest pain, n/v. Pt [redacted] weeks pregnant, has been feeling normal movements of baby.

## 2014-08-09 NOTE — ED Notes (Signed)
Crackers, peanut butter, apple juice given

## 2014-08-10 ENCOUNTER — Encounter (HOSPITAL_COMMUNITY): Payer: Self-pay | Admitting: Anesthesiology

## 2014-08-10 LAB — TYPE AND SCREEN
ABO/RH(D): B POS
Antibody Screen: NEGATIVE

## 2014-08-10 LAB — WET PREP, GENITAL
Trich, Wet Prep: NONE SEEN
Yeast Wet Prep HPF POC: NONE SEEN

## 2014-08-10 MED ORDER — NALOXONE HCL 0.4 MG/ML IJ SOLN: 0.4000 mg | INTRAMUSCULAR | Status: DC | PRN

## 2014-08-10 MED ORDER — SODIUM CHLORIDE 0.9 % IJ SOLN: 9.0000 mL | INTRAMUSCULAR | Status: DC | PRN

## 2014-08-10 MED ORDER — DIPHENHYDRAMINE HCL 50 MG/ML IJ SOLN
12.5000 mg | Freq: Four times a day (QID) | INTRAMUSCULAR | Status: DC | PRN
  Filled 2014-08-10: qty 0.25

## 2014-08-10 MED ORDER — SODIUM CHLORIDE 0.9 % IV SOLN
12.5000 mg | Freq: Four times a day (QID) | INTRAVENOUS | Status: DC | PRN
  Filled 2014-08-10: qty 0.25

## 2014-08-10 MED ORDER — HYDROMORPHONE 0.3 MG/ML IV SOLN
INTRAVENOUS | Status: DC
  Administered 2014-08-10: 2.49 mg via INTRAVENOUS
  Administered 2014-08-10: 06:00:00 via INTRAVENOUS

## 2014-08-10 MED ORDER — HYDROMORPHONE 2 MG/ML HIGH CONCENTRATION IV PCA SOLN: INTRAVENOUS | Status: DC

## 2014-08-10 MED ORDER — HYDROMORPHONE 2 MG/ML HIGH CONCENTRATION IV PCA SOLN
INTRAVENOUS | Status: DC
  Administered 2014-08-10: 4.2 mg via INTRAVENOUS
  Administered 2014-08-10: 5.6 mL via INTRAVENOUS
  Administered 2014-08-10: 14:00:00 via INTRAVENOUS
  Administered 2014-08-11: 6.3 mg via INTRAVENOUS
  Administered 2014-08-11: 5.6 mg via INTRAVENOUS
  Administered 2014-08-11: 2.8 mg via INTRAVENOUS
  Administered 2014-08-11: 22:00:00 via INTRAVENOUS
  Administered 2014-08-11: 2.1 mL via INTRAVENOUS
  Administered 2014-08-11: 7 mg via INTRAVENOUS
  Administered 2014-08-12: 5.6 mg via INTRAVENOUS
  Filled 2014-08-10 (×2): qty 25

## 2014-08-10 MED ORDER — DIPHENHYDRAMINE HCL 12.5 MG/5ML PO ELIX
12.5000 mg | ORAL_SOLUTION | Freq: Four times a day (QID) | ORAL | Status: DC | PRN
  Filled 2014-08-10: qty 5

## 2014-08-10 MED ORDER — DIPHENHYDRAMINE HCL 12.5 MG/5ML PO ELIX: 12.5000 mg | ORAL_SOLUTION | Freq: Four times a day (QID) | ORAL | Status: DC | PRN

## 2014-08-10 MED ORDER — ONDANSETRON HCL 4 MG/2ML IJ SOLN: 4.0000 mg | Freq: Four times a day (QID) | INTRAMUSCULAR | Status: DC | PRN

## 2014-08-10 MED ORDER — HYDROMORPHONE 0.3 MG/ML IV SOLN
INTRAVENOUS | Status: DC
  Administered 2014-08-10: 2.99 mg via INTRAVENOUS

## 2014-08-10 MED ORDER — HYDROMORPHONE 2 MG/ML HIGH CONCENTRATION IV PCA SOLN
INTRAVENOUS | Status: DC
  Filled 2014-08-10: qty 25

## 2014-08-10 MED ORDER — ONDANSETRON HCL 4 MG/2ML IJ SOLN
4.0000 mg | Freq: Four times a day (QID) | INTRAMUSCULAR | Status: DC | PRN
  Administered 2014-08-10 – 2014-08-12 (×4): 4 mg via INTRAVENOUS
  Filled 2014-08-10 (×4): qty 2

## 2014-08-10 NOTE — Progress Notes (Signed)
Total PCA given 2.99mg  Total demand 16 Delivered 15

## 2014-08-10 NOTE — Progress Notes (Signed)
PCA history for 2313 to 0253 Total Drug: 3.59 Total demand: 20 Delivered: 18

## 2014-08-10 NOTE — Progress Notes (Signed)
Patient ID: [name], female   DOB: October 19, 1990, 24 y.o.   MRN: [id number] [place]) NOTE  [name] is a 24 y.o. G2P0010 at 110w3d by early ultrasound who is admitted for sickle cell crisis.   Fetal presentation is unsure. Length of Stay:  1  Days  Subjective: Reports continued pain in upper back and shoulders. Patient reports the fetal movement as active. Patient reports uterine contraction  activity as none. Patient reports  vaginal bleeding as none. Patient describes fluid per vagina as None.  Vitals:  Blood pressure 119/60, pulse 89, temperature 97.6 F (36.4 C), temperature source Axillary, resp. rate 18, height 5\' 2"  (1.575 m), weight 163 lb (73.936 kg), last menstrual period 03/20/2014, SpO2 96 %. Physical Examination: General appearance - alert, well appearing, and in no distress Abdomen - gravid, NT Fundal Height:  size equals dates Extremities: extremities normal, atraumatic, no cyanosis or edema  Membranes:intact  Fetal Monitoring: positive on doppler  Labs:  Results for orders placed or performed during the hospital encounter of 08/09/14 (from the past 24 hour(s))  CBC WITH DIFFERENTIAL   Collection Time: 08/09/14  2:36 PM  Result Value Ref Range   WBC 14.9 (H) 4.0 - 10.5 K/uL   RBC 2.87 (L) 3.87 - 5.11 MIL/uL   Hemoglobin 9.0 (L) 12.0 - 15.0 g/dL   HCT 26.1 (L) 36.0 - 46.0 %   MCV 90.9 78.0 - 100.0 fL   MCH 31.4 26.0 - 34.0 pg   MCHC 34.5 30.0 - 36.0 g/dL   RDW 16.3 (H) 11.5 - 15.5 %   Platelets 479 (H) 150 - 400 K/uL   Neutrophils Relative % 77 43 - 77 %   Neutro Abs 11.5 (H) 1.7 - 7.7 K/uL   Lymphocytes Relative 14 12 - 46 %   Lymphs Abs 2.1 0.7 - 4.0 K/uL   Monocytes Relative 8 3 - 12 %   Monocytes Absolute 1.1 (H) 0.1 - 1.0 K/uL   Eosinophils Relative 1 0 - 5 %   Eosinophils Absolute 0.2 0.0 - 0.7 K/uL   Basophils Relative 0 0 - 1 %   Basophils Absolute 0.0 0.0 - 0.1 K/uL  Comprehensive metabolic panel   Collection Time: 08/09/14  2:36 PM  Result Value Ref Range   Sodium 135 135 - 145 mmol/L   Potassium 3.8 3.5 - 5.1 mmol/L   Chloride 105 96 - 112 mmol/L   CO2 23 19 - 32 mmol/L   Glucose, Bld 77 70 - 99 mg/dL   BUN 5 (L) 6 - 23 mg/dL   Creatinine, Ser 0.50 0.50 - 1.10 mg/dL   Calcium 8.7 8.4 - 10.5 mg/dL   Total Protein 7.1 6.0 - 8.3 g/dL   Albumin 3.6 3.5 - 5.2 g/dL   AST 25 0 - 37 U/L   ALT 23 0 - 35 U/L   Alkaline Phosphatase 50 39 - 117 U/L   Total Bilirubin 2.1 (H) 0.3 - 1.2 mg/dL   GFR calc non Af Amer >90 >90 mL/min   GFR calc Af Amer >90 >90 mL/min   Anion gap 7 5 - 15  Reticulocytes   Collection Time: 08/09/14  2:36 PM  Result Value Ref Range   Retic Ct Pct 17.9 (H) 0.4 - 3.1 %   RBC. 2.87 (L) 3.87 - 5.11 MIL/uL   Retic Count, Manual 513.7 (H) 19.0 - 186.0 K/uL  Type and screen   Collection Time: 08/10/14  2:25 AM  Result Value Ref  Range   ABO/RH(D) B POS    Antibody Screen NEG    Sample Expiration 08/13/2014   Wet prep, genital   Collection Time: 08/10/14  6:03 AM  Result Value Ref Range   Yeast Wet Prep HPF POC NONE SEEN NONE SEEN   Trich, Wet Prep NONE SEEN NONE SEEN   Clue Cells Wet Prep HPF POC MODERATE (A) NONE SEEN   WBC, Wet Prep HPF POC FEW (A) NONE SEEN    Medications:  Scheduled . docusate sodium  100 mg Oral Daily  . enoxaparin (LOVENOX) injection  40 mg Subcutaneous Q24H  . folic acid  2 mg Oral Daily  . HYDROmorphone PCA 0.3 mg/mL   Intravenous 6 times per day  . methadone  5 mg Oral QHS  . prenatal multivitamin  1 tablet Oral Q1200   I have reviewed the patient's current medications.  ASSESSMENT: Patient Active Problem List   Diagnosis Date Noted  . 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 Supervision of high-risk pregnancy 06/11/2014  . Chronic pain 02/26/2013  . Anemia of chronic disease 10/05/2012  . 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 Mood swings 11/08/2011  . Migraines 11/08/2011  . Overweight(278.02) 05/24/2011  .  GERD (gastroesophageal reflux disease) 02/17/2011  . Depression 01/06/2011  . Back pain 09/17/2010  . Sickle cell disease 01/08/2009  . 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 Tomorrow  Donnamae Jude, MD 08/10/2014,12:34 PM
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
## 2014-08-11 LAB — CBC
HEMATOCRIT: 23.8 % — AB (ref 36.0–46.0)
Hemoglobin: 8.1 g/dL — ABNORMAL LOW (ref 12.0–15.0)
MCH: 31 pg (ref 26.0–34.0)
MCHC: 34 g/dL (ref 30.0–36.0)
MCV: 91.2 fL (ref 78.0–100.0)
PLATELETS: 406 10*3/uL — AB (ref 150–400)
RBC: 2.61 MIL/uL — AB (ref 3.87–5.11)
RDW: 17.4 % — ABNORMAL HIGH (ref 11.5–15.5)
WBC: 15.3 10*3/uL — ABNORMAL HIGH (ref 4.0–10.5)

## 2014-08-11 NOTE — Progress Notes (Signed)
Patient ID: NAKOTA ACKERT, female   DOB: Apr 05, 1991, 24 y.o.   MRN: [id number] SICKLE CELL SERVICE CONSULT NOTE  ALZORA HA TFT:[id number] DOB: [birth year] DOA: 08/09/2014 PCP: MATTHEWS,MICHELLE A., MD   HPI/Subjective: Fayola Meckes is a 24yo with sickle-cell disease who is currently [redacted] weeks pregnant who initially presented to the ER with symptoms consistent with vaso-occlusive crisis. Pt states that her pain began 2 days ago and has mainly been in her back, and left arm. She presented to the ED for pain and was transferred to Ob/Gyn service for further management. She was placed on a PCA and has had a small improvement in her pain. In addition, she complains of nausea. She is pulling at her hair more and feels stressed about life factors.    Objective: Filed Vitals:   08/11/14 1521 08/11/14 1526 08/11/14 1527 08/11/14 1550  BP:    107/58  Pulse: 101 92 103 88  Temp:    98.2 F (36.8 C)  TempSrc:    Oral  Resp:    16  Height:      Weight:      SpO2: 95% 97% 100% 96%   Weight change:  No intake or output data in the 24 hours ending 08/11/14 1700  General: Alert, awake, oriented x3, in no acute distress. Laying in bed.  HEENT: [place]/AT PEERL, EOMI Neck: Trachea midline,  no masses, no JVD, no carotid bruit OROPHARYNX:  Moist, no erythema or oropharyngeal lesions Heart: Regular rate and rhythm, without murmurs, rubs, gallops. Lungs: Clear to auscultation Abdomen: Soft, nontender, +gravid, positive bowel sounds, no masses no hepatosplenomegaly noted.  Neuro: No focal neurological deficits noted cranial nerves II through XII grossly intact.   Musculoskeletal: No warm swelling or erythema around joints, no spinal tenderness noted.  Extremities: no cyanosis or edema  Data Reviewed: Basic Metabolic Panel:  Recent Labs Lab 08/09/14 1436  NA 135  K 3.8  CL 105  CO2 23  GLUCOSE 77  BUN 5*  CREATININE 0.50  CALCIUM 8.7   Liver Function Tests:  Recent Labs Lab  08/09/14 1436  AST 25  ALT 23  ALKPHOS 50  BILITOT 2.1*  PROT 7.1  ALBUMIN 3.6   CBC:  Recent Labs Lab 08/09/14 1436 08/11/14 0905  WBC 14.9* 15.3*  NEUTROABS 11.5*  --   HGB 9.0* 8.1*  HCT 26.1* 23.8*  MCV 90.9 91.2  PLT 479* 406*     Scheduled Meds: . docusate sodium  100 mg Oral Daily  . enoxaparin (LOVENOX) injection  40 mg Subcutaneous Q24H  . folic acid  2 mg Oral Daily  . HYDROmorphone PCA 2 mg/mL   Intravenous 6 times per day  . methadone  5 mg Oral QHS  . prenatal multivitamin  1 tablet Oral Q1200   Continuous Infusions: . lactated ringers 125 mL/hr at 08/11/14 1218    Principal Problem:   Hb-SS disease with crisis   Assessment/Plan: Principal Problem:   Hb-SS disease with crisis  1. Sickle Cell Crisis: Pt having pain consistent with vaso-occlusive crisis. She reports that her pain has improved today. Pt's IV infiltrated at the time she was seen, so no IV pain medicine was given. Once IV is establish, Switch to Full Dose Dilaudid PCA at 0.3mg  q50min lockout of 2mg , and add Dilaudid IV injection of 2mg  q2h PRN. Continue Methadone for long acting control. Stop Roxicodone. Avoid Toradol due to pregnancy. In addition, her leukocytosis in the absence of fever or signs of infection is  likely reflective of crisis. 2. Sickle Cell Care: Continue Folic acid. Avoid Hydrea due to pregnancy. 3. Anemia: Hgb has gone from 9.0 to 8.1 in the past day. Obtain an LDH with CBC and diff in the morning, to look for hemolysis, although her baseline is ~8. Monitor. Transfuse if hgb<8 or symptomatic due to pregnancy. 4. Pregnancy: Avoid teratogens, continue prenatal vitamin. 5. Constipation adverse effect of opioid use: Add Senna daily or Miralax PRN to prevent constipation. Continue Colace.   6. FEN/GI :  Regular diet Switch IV fluids to D5/0.45% at 157ml/hr.   Code Status: full DVT Prophylaxis: enoxaparin   Kalman Shan  Pager [phone]. If 7PM-7AM, please contact  night-coverage.  08/11/2014, 5:00 PM  LOS: 2 days   Kalman Shan

## 2014-08-11 NOTE — Progress Notes (Signed)
Patient ID: [name], female   DOB: 1990-08-01, 24 y.o.   MRN: [id number] [place]) NOTE  [name] is a 24 y.o. G2P0010 at [redacted]w[redacted]d by LMP, early ultrasound who is admitted for sickle cell pain crisis.   Fetal presentation is unsure. Length of Stay:  2  Days  Subjective: Reports no significant change in pain.  On high dose PCA which seems to be working. Patient reports the fetal movement as active. Patient reports uterine contraction  activity as none. Patient reports  vaginal bleeding as none. Patient describes fluid per vagina as None.  Vitals:  Blood pressure 123/63, pulse 103, temperature 98.5 F (36.9 C), temperature source Oral, resp. rate 14, height 5\' 2"  (1.575 m), weight 163 lb (73.936 kg), last menstrual period 03/20/2014, SpO2 100 %. Physical Examination:  General appearance - alert, ill appearing, and in no distress Abdomen - gravid, NT Fundal Height:  size equals dates Extremities: extremities normal, atraumatic, no cyanosis or edema  Membranes:intact  Fetal Monitoring:  positive on Doppler  Medications:  Scheduled . docusate sodium  100 mg Oral Daily  . enoxaparin (LOVENOX) injection  40 mg Subcutaneous Q24H  . folic acid  2 mg Oral Daily  . HYDROmorphone PCA 2 mg/mL   Intravenous 6 times per day  . methadone  5 mg Oral QHS  . prenatal multivitamin  1 tablet Oral Q1200   I have reviewed the patient's current medications.  ASSESSMENT: Patient Active Problem List   Diagnosis Date Noted  . 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 Supervision of high-risk pregnancy 06/11/2014  . Chronic pain 02/26/2013  . Anemia of chronic disease 10/05/2012  . 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 Mood swings 11/08/2011  . Migraines 11/08/2011  . Overweight(278.02) 05/24/2011  . GERD (gastroesophageal reflux disease) 02/17/2011  . Depression 01/06/2011  . Back pain 09/17/2010  . Sickle cell disease 01/08/2009  .  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  Donnamae Jude, MD 08/11/2014,6:51 AM

## 2014-08-11 NOTE — Progress Notes (Signed)
Pt off the PCA during the d/c of infiltrated iv and restart of the new site. Second Rn reviewed with RN the restart of the PCA

## 2014-08-11 NOTE — Progress Notes (Signed)
Patient locked herself in the bathroom and did not want to be bothered for 1 hr 30 mins. Kept checking on her finally I called the Charge nurse to bring me the keys to the door. Patient kept saying she was ok and breathing and wants to be left alone. When she came out her fluid was dry and had taken her armband off. Patient pulls her hair and eats it or throw it on the floor. When asked she said she has been  doing that since she was 24 years old.

## 2014-08-11 NOTE — Progress Notes (Signed)
Pt spending a long time in the bathroom stating that she needs to have a BM.  Pt offered to obtain order for Miralax, pt refused.

## 2014-08-11 NOTE — Progress Notes (Signed)
PC received from Dr. Sunday Shams and discussed with RN that high dose PCA.  No changes or discontinue the high dose at this time.

## 2014-08-11 NOTE — Plan of Care (Signed)
Problem: Consults Goal: Chaplain Consult Outcome: Not Applicable Date Met:  15/94/58 Pt refusing chaplain visit

## 2014-08-12 MED ORDER — DIPHENHYDRAMINE HCL 25 MG PO CAPS
25.0000 mg | ORAL_CAPSULE | Freq: Four times a day (QID) | ORAL | Status: DC | PRN
  Filled 2014-08-12: qty 1

## 2014-08-12 MED ORDER — DIPHENHYDRAMINE HCL 50 MG/ML IJ SOLN
25.0000 mg | Freq: Four times a day (QID) | INTRAMUSCULAR | Status: DC | PRN
  Administered 2014-08-12: 25 mg via INTRAVENOUS
  Filled 2014-08-12: qty 1

## 2014-08-12 MED ORDER — HYDROMORPHONE 2 MG/ML HIGH CONCENTRATION IV PCA SOLN
INTRAVENOUS | Status: DC
  Administered 2014-08-12: 10.5 mg via INTRAVENOUS
  Administered 2014-08-12: 4.9 mg via INTRAVENOUS
  Administered 2014-08-13: 12:00:00 via INTRAVENOUS
  Administered 2014-08-13: 9.1 mg via INTRAVENOUS
  Administered 2014-08-13: 4.9 mg via INTRAVENOUS
  Administered 2014-08-13: 10.5 mg via INTRAVENOUS
  Administered 2014-08-13: 6.3 mg via INTRAVENOUS
  Administered 2014-08-13: 7 mg via INTRAVENOUS
  Administered 2014-08-14: 0.7 mg via INTRAVENOUS
  Administered 2014-08-14: 14.7 mg via INTRAVENOUS
  Administered 2014-08-14: 4.6 mg via INTRAVENOUS
  Administered 2014-08-14: 2.1 mg via INTRAVENOUS
  Administered 2014-08-14: 7 mg via INTRAVENOUS
  Administered 2014-08-14: 8.4 mg via INTRAVENOUS
  Administered 2014-08-15: 0.7 mg via INTRAVENOUS
  Administered 2014-08-15: 11.9 mg via INTRAVENOUS
  Administered 2014-08-15: 9.8 mg via INTRAVENOUS
  Administered 2014-08-15: 6.3 mg via INTRAVENOUS
  Administered 2014-08-15: 7.7 mg via INTRAVENOUS
  Filled 2014-08-12 (×3): qty 25

## 2014-08-12 MED ORDER — SENNOSIDES-DOCUSATE SODIUM 8.6-50 MG PO TABS
1.0000 | ORAL_TABLET | Freq: Two times a day (BID) | ORAL | Status: DC
  Administered 2014-08-12 – 2014-08-15 (×6): 1 via ORAL
  Filled 2014-08-12 (×8): qty 1

## 2014-08-12 MED ORDER — POLYETHYLENE GLYCOL 3350 17 G PO PACK: 17.0000 g | PACK | Freq: Every day | ORAL | Status: DC | PRN

## 2014-08-12 MED ORDER — ONDANSETRON HCL 4 MG/2ML IJ SOLN
4.0000 mg | Freq: Four times a day (QID) | INTRAMUSCULAR | Status: DC | PRN
Start: 2014-08-12 — End: 2014-08-15
  Administered 2014-08-14: 4 mg via INTRAVENOUS
  Filled 2014-08-12: qty 2

## 2014-08-12 MED ORDER — HYDROMORPHONE 0.3 MG/ML IV SOLN
INTRAVENOUS | Status: DC
  Administered 2014-08-12: 6.2 mg via INTRAVENOUS
  Filled 2014-08-12: qty 25

## 2014-08-12 MED ORDER — SODIUM CHLORIDE 0.9 % IJ SOLN: 9.0000 mL | INTRAMUSCULAR | Status: DC | PRN

## 2014-08-12 MED ORDER — NALOXONE HCL 0.4 MG/ML IJ SOLN: 0.4000 mg | INTRAMUSCULAR | Status: DC | PRN

## 2014-08-12 MED ORDER — DEXTROSE-NACL 5-0.45 % IV SOLN
INTRAVENOUS | Status: DC
  Administered 2014-08-12 – 2014-08-13 (×3): via INTRAVENOUS

## 2014-08-12 NOTE — Progress Notes (Signed)
Patient ID: [name], female   DOB: July 10, 1990, 24 y.o.   MRN: [id number]  Discussed patient with Dr Liston Alba from Sickle Cell Crisis team.  As the patient is prior to 23 weeks, it is advantageous for her to be transferred to [facility] for management of her Sickle Cell Crisis.  I discussed this with the patient.  Transfer orders placed.  Truett Mainland, DO 08/12/2014 2:32 PM

## 2014-08-12 NOTE — Plan of Care (Signed)
Problem: Phase II Progression Outcomes Goal: Electronic fetal monitoring(Doppler,Continuous,Intermittent) EFM (Doppler, Continuous, Intermittent)  Outcome: Progressing FHR Q8h Goal: Mattress in use Mattress in use ( Eggcrate, Med/Surg, Regular, Birthing Suite, Other)  Outcome: Completed/Met Date Met:  08/12/14 Reg.

## 2014-08-12 NOTE — Progress Notes (Signed)
SICKLE CELL SERVICE PROGRESS NOTE  [name] UXN:[id number] DOB: 10/23/1990 DOA: 08/09/2014 PCP: Reuven Braver A., MD  Assessment/Plan: Active Problems:   Sickle cell anemia with crisis     Hb SS with crisis: Pt who is [redacted] weeks GA is transferred from [facility]  with vaso-occlusive episode. She rates her pain as 6/10 and mostly localized to left UE.I will continue PCA and consider scheduling oral analgesics tomorrow.  Continue Methadone.   Leukocytosis: This is likely due to her crisis as she has no evidence of infection however in light of pregnancy a U/A was.checked and found to be negative for infection.  Intra Uterine Pregnancy: Pt is estimated [redacted] wks GA by Obstetric U/S.  Her last Hb Electrophoresis was done on 07/21/2014 which showed  Hb S%  72.1%. The goal during pregnancy is to keep % of Hb S < 30%. Pt needs to follow up in the clinic for Electrophoresis n 08/22/2014 and possible partial exchange transfusion on 08/25/2014.  Anemia of Chronic Disease: Hb stable. See above  Chronic pain: Continue Methadone  Constipation: Will continue Senna-S and treat further with milk of magnesia.  Code Status: Full Code Family Communication: N/A Disposition Plan: Discharge likely tomorrow.  Auriel Kist A.  Pager [phone]. If 7PM-7AM, please contact night-coverage.  08/12/2014, 5:44 PM  LOS: 3 days    Consultants:  None  Procedures:  None  Antibiotics:  None  HPI/Subjective: Pt awake and alert today. She rates her pain as 6/10 and mostly localized to LUE. Last BM 2 days ago.  Objective: Filed Vitals:   08/12/14 1609 08/12/14 1614 08/12/14 1615 08/12/14 1710  BP:    106/66  Pulse: 109 104 104 103  Temp:    98.7 F (37.1 C)  TempSrc:    Oral  Resp:    16  Height:    5\' 2"  (1.575 m)  Weight:    163 lb (73.936 kg)  SpO2: 97% 96% 96% 99%   Weight change:  No intake or output data in the 24 hours ending 08/12/14 1744  General: Alert, awake, oriented x3, in no acute  distress.  HEENT: [place]/AT PEERL, EOMI, anicteric. Heart: Regular rate and rhythm, without murmurs, rubs, gallops.  Lungs: Clear to auscultation, no wheezing or rhonchi noted. No increased vocal fremitus resonant to percussion  Abdomen: Soft, nontender, nondistended, positive bowel sounds, no masses no hepatosplenomegaly noted.  Neuro: No focal neurological deficits noted cranial nerves II through XII grossly intact.  Strength normal in bilateral upper and lower extremities. Musculoskeletal: No warm swelling or erythema around joints, no spinal tenderness noted. Psychiatric: Patient alert and oriented x3, good insight and cognition, good recent to remote recall. Lymph node survey: No cervical axillary or inguinal lymphadenopathy noted.   Data Reviewed: Basic Metabolic Panel:  Recent Labs Lab 08/09/14 1436  NA 135  K 3.8  CL 105  CO2 23  GLUCOSE 77  BUN 5*  CREATININE 0.50  CALCIUM 8.7   Liver Function Tests:  Recent Labs Lab 08/09/14 1436  AST 25  ALT 23  ALKPHOS 50  BILITOT 2.1*  PROT 7.1  ALBUMIN 3.6   No results for input(s): LIPASE, AMYLASE in the last 168 hours. No results for input(s): AMMONIA in the last 168 hours. CBC:  Recent Labs Lab 08/09/14 1436 08/11/14 0905  WBC 14.9* 15.3*  NEUTROABS 11.5*  --   HGB 9.0* 8.1*  HCT 26.1* 23.8*  MCV 90.9 91.2  PLT 479* 406*   Cardiac Enzymes: No results for input(s): CKTOTAL,  CKMB, CKMBINDEX, TROPONINI in the last 168 hours. BNP (last 3 results) No results for input(s): PROBNP in the last 8760 hours. CBG: No results for input(s): GLUCAP in the last 168 hours.  Recent Results (from the past 240 hour(s))  Wet prep, genital     Status: Abnormal   Collection Time: 08/10/14  6:03 AM  Result Value Ref Range Status   Yeast Wet Prep HPF POC NONE SEEN NONE SEEN Final   Trich, Wet Prep NONE SEEN NONE SEEN Final   Clue Cells Wet Prep HPF POC MODERATE (A) NONE SEEN Final   WBC, Wet Prep HPF POC FEW (A) NONE SEEN Final     Comment: MODERATE BACTERIA SEEN     Studies: Dg Chest 2 View  08/09/2014   CLINICAL DATA:  Fever, [redacted] weeks pregnant  EXAM: CHEST  2 VIEW  COMPARISON:  04/15/2014  FINDINGS: The heart size and mediastinal contours are within normal limits. Both lungs are clear with the exception of streaky linear upper lobe scarring reidentified. 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 Central vertebral body compression deformities are also compatible with sickle cell disease. Cholecystectomy clips are noted.  IMPRESSION: No active cardiopulmonary disease.   Electronically Signed   By: Conchita Paris M.D.   On: 08/09/2014 16:38   US Ob Detail + 14 Wk  07/29/2014   OBSTETRICAL ULTRASOUND: This exam was performed within a [place] [facility]. The OB US report was generated in the AS system, and faxed to the ordering physician.   This report is available in the [facility]. See the AS Obstetric US report via the Image Link.   Scheduled Meds: . enoxaparin (LOVENOX) injection  40 mg Subcutaneous Q24H  . folic acid  2 mg Oral Daily  . HYDROmorphone PCA 2 mg/mL   Intravenous 6 times per day  . methadone  5 mg Oral QHS  . prenatal multivitamin  1 tablet Oral Q1200  . senna-docusate  1 tablet Oral BID   Continuous Infusions: . dextrose 5 % and 0.45% NaCl      Time spent 40 minutes

## 2014-08-12 NOTE — Progress Notes (Signed)
Additional Medicare IM given.

## 2014-08-12 NOTE — Progress Notes (Signed)
FACULTY PRACTICE ANTEPARTUM(COMPREHENSIVE) NOTE  [name] is a 24 y.o. G2P0010 at [redacted]w[redacted]d by LMP who is admitted for sickle cell crisis.   Fetal presentation is unsure. Length of Stay:  3  Days  Subjective: Pain in arms somewhat improved Patient reports the fetal movement as active. Patient reports uterine contraction  activity as none. Patient reports  vaginal bleeding as none. Patient describes fluid per vagina as None.  Vitals:  Blood pressure 98/60, pulse 95, temperature 99.5 F (37.5 C), temperature source Oral, resp. rate 16, height 5\' 2"  (1.575 m), weight 73.936 kg (163 lb), last menstrual period 03/20/2014, SpO2 99 %. Physical Examination:  General appearance - in mild to moderate distress Heart - normal rate and regular rhythm Abdomen - soft, nontender, nondistended Fundal Height:  size equals dates Cervical Exam: Not evaluated. Extremities: extremities normal, atraumatic, no cyanosis or edema and Homans sign is negative, no sign of DVT  Membranes:intact  Fetal Monitoring:    Fetal Heart Rate A     Mode  External filed at 08/12/2014 0035    Baseline Rate (A)  136 bpm filed at 08/12/2014 0035        Labs:  Results for orders placed or performed during the hospital encounter of 08/09/14 (from the past 24 hour(s))  CBC   Collection Time: 08/11/14  9:05 AM  Result Value Ref Range   WBC 15.3 (H) 4.0 - 10.5 K/uL   RBC 2.61 (L) 3.87 - 5.11 MIL/uL   Hemoglobin 8.1 (L) 12.0 - 15.0 g/dL   HCT 23.8 (L) 36.0 - 46.0 %   MCV 91.2 78.0 - 100.0 fL   MCH 31.0 26.0 - 34.0 pg   MCHC 34.0 30.0 - 36.0 g/dL   RDW 17.4 (H) 11.5 - 15.5 %   Platelets 406 (H) 150 - 400 K/uL    Imaging Studies:    Currently EPIC will not allow sonographic studies to automatically populate into notes.  In the meantime, copy and paste results into note or free text.  Medications:  Scheduled . docusate sodium  100 mg Oral Daily  . enoxaparin (LOVENOX) injection  40 mg Subcutaneous Q24H  .  folic acid  2 mg Oral Daily  . HYDROmorphone PCA 2 mg/mL   Intravenous 6 times per day  . methadone  5 mg Oral QHS  . prenatal multivitamin  1 tablet Oral Q1200   I have reviewed the patient's current medications.  ASSESSMENT: Patient Active Problem List   Diagnosis Date Noted  . 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 Supervision of high-risk pregnancy 06/11/2014  . Chronic pain 02/26/2013  . Anemia of chronic disease 10/05/2012  . 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 Mood swings 11/08/2011  . Migraines 11/08/2011  . Overweight(278.02) 05/24/2011  . GERD (gastroesophageal reflux disease) 02/17/2011  . Depression 01/06/2011  . Back pain 09/17/2010  . Sickle cell disease 01/08/2009  . 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

## 2014-08-13 DIAGNOSIS — D5700 Hb-SS disease with crisis, unspecified: Secondary | ICD-10-CM

## 2014-08-13 LAB — BASIC METABOLIC PANEL
ANION GAP: 8 (ref 5–15)
BUN: 7 mg/dL (ref 6–23)
CO2: 23 mmol/L (ref 19–32)
CREATININE: 0.4 mg/dL — AB (ref 0.50–1.10)
Calcium: 8.5 mg/dL (ref 8.4–10.5)
Chloride: 101 mmol/L (ref 96–112)
GFR calc Af Amer: >90 mL/min (ref >90)
Glucose, Bld: 93 mg/dL (ref 70–99)
POTASSIUM: 3.8 mmol/L (ref 3.5–5.1)
SODIUM: 132 mmol/L — AB (ref 135–145)

## 2014-08-13 LAB — CBC WITH DIFFERENTIAL/PLATELET
BASOS PCT: 0 % (ref 0–1)
Basophils Absolute: 0 10*3/uL (ref 0.0–0.1)
EOS PCT: 2 % (ref 0–5)
Eosinophils Absolute: 0.3 10*3/uL (ref 0.0–0.7)
HCT: 22.7 % — ABNORMAL LOW (ref 36.0–46.0)
Hemoglobin: 7.7 g/dL — ABNORMAL LOW (ref 12.0–15.0)
LYMPHS ABS: 2.3 10*3/uL (ref 0.7–4.0)
Lymphocytes Relative: 15 % (ref 12–46)
MCH: 31.2 pg (ref 26.0–34.0)
MCHC: 33.9 g/dL (ref 30.0–36.0)
MCV: 91.9 fL (ref 78.0–100.0)
MONO ABS: 1.5 10*3/uL — AB (ref 0.1–1.0)
Monocytes Relative: 10 % (ref 3–12)
Neutro Abs: 10.9 10*3/uL — ABNORMAL HIGH (ref 1.7–7.7)
Neutrophils Relative %: 73 % (ref 43–77)
PLATELETS: 452 10*3/uL — AB (ref 150–400)
RBC: 2.47 MIL/uL — ABNORMAL LOW (ref 3.87–5.11)
RDW: 17.6 % — ABNORMAL HIGH (ref 11.5–15.5)
WBC: 15 10*3/uL — ABNORMAL HIGH (ref 4.0–10.5)

## 2014-08-13 LAB — LACTATE DEHYDROGENASE: LDH: 216 U/L (ref 94–250)

## 2014-08-13 MED ORDER — OXYCODONE HCL 5 MG PO TABS
15.0000 mg | ORAL_TABLET | ORAL | Status: DC
  Administered 2014-08-13 – 2014-08-15 (×12): 15 mg via ORAL
  Filled 2014-08-13 (×12): qty 3

## 2014-08-13 MED ORDER — DIPHENHYDRAMINE HCL 12.5 MG/5ML PO ELIX
25.0000 mg | ORAL_SOLUTION | Freq: Four times a day (QID) | ORAL | Status: DC | PRN
  Filled 2014-08-13: qty 10

## 2014-08-13 NOTE — Progress Notes (Cosign Needed)
Fetal heart tones assessed by Clint Guy, RN. Fetal heart rate 140 bpm.

## 2014-08-13 NOTE — Progress Notes (Cosign Needed)
Fetal heart tones assessed by Clint Guy, RN. Fetal heart rate 160 bpm.

## 2014-08-13 NOTE — Progress Notes (Signed)
Pt requested benadryl for itching. This Probation officer brought a benadryl capsule as ordered for pt. Pt stated, "I don't want to take that I want it in liquid form. It works better." This Probation officer paged [employer] NP on call who changed the order to liquid oral benadryl. This Probation officer brought it to the room and the pt stated, "I don't want to take that, when I said liquid I meant through my IV." This writer told pt that she would have to discuss IV benadryl with the attending physician later today.Marland Kitchen

## 2014-08-13 NOTE — Progress Notes (Signed)
SICKLE CELL SERVICE PROGRESS NOTE  [name]:[id number] DOB: Jul 25, 1990 DOA: 08/09/2014 PCP: Mariluz Crespo A., MD  Assessment/Plan: Active Problems:   Sickle cell anemia with crisis     Hb SS with crisis: Pt who is [redacted] weeks GA is transferred from [facility]  with vaso-occlusive episode. She rates her pain as 6/10 and mostly localized to left UE.I will continue PCA  As PRN and schedule oral analgesics.  Continue Methadone.   Leukocytosis: This is likely due to her crisis as she has no evidence of infection however in light of pregnancy a U/A was.checked and found to be negative for infection.  Intra Uterine Pregnancy: Pt is estimated [redacted] wks GA by Obstetric U/S.  Her last Hb Electrophoresis was done on 07/21/2014 which showed  Hb S%  72.1%. The goal during pregnancy is to keep % of Hb S < 30%. Pt needs to follow up in the clinic for Electrophoresis n 08/22/2014 and possible partial exchange transfusion on 08/25/2014.  Anemia of Chronic Disease: Hb stable. See above  Chronic pain: Continue Methadone  Constipation: Will continue Senna-S and treat further with milk of magnesia.  Code Status: Full Code Family Communication: N/A Disposition Plan: Discharge likely tomorrow.  Danilo Cappiello A.  Pager [phone]. If 7PM-7AM, please contact night-coverage.  08/13/2014, 12:21 PM  LOS: 4 days    Consultants:  None  Procedures:  None  Antibiotics:  None  HPI/Subjective: Pt awake and alert today. She rates her pain as 6/10 and mostly localized to LUE. Last BM yesterday..  Objective: Filed Vitals:   08/13/14 0300 08/13/14 0527 08/13/14 0541 08/13/14 1048  BP: 118/60  125/80   Pulse: 83  93   Temp: 98.4 F (36.9 C)  98.4 F (36.9 C)   TempSrc: Oral  Other (Comment)   Resp: 14 12 14 17   Height:      Weight:      SpO2: 93%  98%    Weight change:   Intake/Output Summary (Last 24 hours) at 08/13/14 1221 Last data filed at 08/13/14 0858  Gross per 24 hour  Intake    360  ml  Output      0 ml  Net    360 ml    General: Alert, awake, oriented x3, in no acute distress.  HEENT: [place]/AT PEERL, EOMI, anicteric. Heart: Regular rate and rhythm, without murmurs, rubs, gallops.  Lungs: Clear to auscultation, no wheezing or rhonchi noted. No increased vocal fremitus resonant to percussion  Abdomen: Soft, nontender, nondistended, positive bowel sounds, no masses no hepatosplenomegaly noted.  Neuro: No focal neurological deficits noted cranial nerves II through XII grossly intact.  Strength normal in bilateral upper and lower extremities. Musculoskeletal: No warm swelling or erythema around joints, no spinal tenderness noted. Psychiatric: Patient alert and oriented x3, good insight and cognition, good recent to remote recall. Lymph node survey: No cervical axillary or inguinal lymphadenopathy noted.   Data Reviewed: Basic Metabolic Panel:  Recent Labs Lab 08/09/14 1436 08/13/14 0645  NA 135 132*  K 3.8 3.8  CL 105 101  CO2 23 23  GLUCOSE 77 93  BUN 5* 7  CREATININE 0.50 0.40*  CALCIUM 8.7 8.5   Liver Function Tests:  Recent Labs Lab 08/09/14 1436  AST 25  ALT 23  ALKPHOS 50  BILITOT 2.1*  PROT 7.1  ALBUMIN 3.6   No results for input(s): LIPASE, AMYLASE in the last 168 hours. No results for input(s): AMMONIA in the last 168 hours. CBC:  Recent Labs  Lab 08/09/14 1436 08/11/14 0905 08/13/14 0645  WBC 14.9* 15.3* 15.0*  NEUTROABS 11.5*  --  10.9*  HGB 9.0* 8.1* 7.7*  HCT 26.1* 23.8* 22.7*  MCV 90.9 91.2 91.9  PLT 479* 406* 452*   Cardiac Enzymes: No results for input(s): CKTOTAL, CKMB, CKMBINDEX, TROPONINI in the last 168 hours. BNP (last 3 results) No results for input(s): PROBNP in the last 8760 hours. CBG: No results for input(s): GLUCAP in the last 168 hours.  Recent Results (from the past 240 hour(s))  Wet prep, genital     Status: Abnormal   Collection Time: 08/10/14  6:03 AM  Result Value Ref Range Status   Yeast Wet Prep  HPF POC NONE SEEN NONE SEEN Final   Trich, Wet Prep NONE SEEN NONE SEEN Final   Clue Cells Wet Prep HPF POC MODERATE (A) NONE SEEN Final   WBC, Wet Prep HPF POC FEW (A) NONE SEEN Final    Comment: MODERATE BACTERIA SEEN     Studies: Dg Chest 2 View  08/09/2014   CLINICAL DATA:  Fever, [redacted] weeks pregnant  EXAM: CHEST  2 VIEW  COMPARISON:  04/15/2014  FINDINGS: The heart size and mediastinal contours are within normal limits. Both lungs are clear with the exception of streaky linear upper lobe scarring reidentified. 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 Central vertebral body compression deformities are also compatible with sickle cell disease. Cholecystectomy clips are noted.  IMPRESSION: No active cardiopulmonary disease.   Electronically Signed   By: Conchita Paris M.D.   On: 08/09/2014 16:38   US Ob Detail + 14 Wk  07/29/2014   OBSTETRICAL ULTRASOUND: This exam was performed within a [place] [facility] Department. The OB US report was generated in the AS system, and faxed to the ordering physician.   This report is available in the [facility]. See the AS Obstetric US report via the Image Link.   Scheduled Meds: . enoxaparin (LOVENOX) injection  40 mg Subcutaneous Q24H  . folic acid  2 mg Oral Daily  . HYDROmorphone PCA 2 mg/mL   Intravenous 6 times per day  . methadone  5 mg Oral QHS  . prenatal multivitamin  1 tablet Oral Q1200  . senna-docusate  1 tablet Oral BID   Continuous Infusions: . dextrose 5 % and 0.45% NaCl 10 mL/hr at 08/12/14 2202    Time spent 37 minutes

## 2014-08-13 NOTE — Progress Notes (Signed)
Wasted 17mL of a high concentration Dilaudid PCA in sink. Witnessed by Hoyle Sauer, RN.

## 2014-08-14 ENCOUNTER — Telehealth: Payer: Self-pay | Admitting: Internal Medicine

## 2014-08-14 DIAGNOSIS — D571 Sickle-cell disease without crisis: Secondary | ICD-10-CM

## 2014-08-14 DIAGNOSIS — G8929 Other chronic pain: Secondary | ICD-10-CM

## 2014-08-14 MED ORDER — VITAMINS A & D EX OINT
TOPICAL_OINTMENT | CUTANEOUS | Status: AC
  Administered 2014-08-15: 02:00:00
  Filled 2014-08-14: qty 5

## 2014-08-14 NOTE — Progress Notes (Addendum)
Patient ID: [name], female   DOB: 05-05-1991, 24 y.o.   MRN: [id number] SICKLE CELL SERVICE PROGRESS NOTE  [name] NOB:[id number] DOB: 1990/09/11 DOA: 08/09/2014 PCP: MATTHEWS,MICHELLE A., MD  HUT:[id number] Luster is a 24yo with sickle-cell disease who is currently [redacted] weeks pregnant who initially presented to the ER with symptoms consistent with vaso-occlusive crisis. Pt states that her pain began today in her right foot. She currently rates the pain as a 8/10. She is unable to walk on it, and had to be carried into the car. She tried taking her home meds for it but it didn't help. When she walks on foot pain worsens and radiates to the knee. She is eating and drinking well. Her last bowel movement was yesterday.   Consultants:  none  Procedures:  none  Antibiotics:  none  HPI/Subjective: Pt reports her pain is better but still most severe in her left arm, shoulder, and back. She is eating and drinking well. She is getting up to go to restroom. Denies CP, SOB, HA, dizziness, weakness, and v/d.  Objective: Filed Vitals:   08/14/14 0216 08/14/14 0511 08/14/14 0513 08/14/14 1118  BP:   97/60   Pulse:   92   Temp:   98.4 F (36.9 C)   TempSrc:   Oral   Resp: 11 12 12 13   Height:      Weight:      SpO2:  99% 94%    Weight change:   Intake/Output Summary (Last 24 hours) at 08/14/14 1157 Last data filed at 08/14/14 0600  Gross per 24 hour  Intake    480 ml  Output      0 ml  Net    480 ml    General: Alert, awake, oriented x3, in no acute distress.  HEENT: [place]/AT PEERL, EOMI OROPHARYNX:  Moist, no erythema or oropharyngeal lesions Heart: Regular rate and rhythm, without murmurs, rubs, gallops. Lungs: Clear to auscultation Abdomen: Soft, nontender, +gravid, positive bowel sounds, no masses no hepatosplenomegaly noted.  Neuro: No focal neurological deficits noted cranial nerves II through XII grossly intact. Musculoskeletal: No warmth, swelling, or erythema  around joints, no spinal tenderness noted. Mild tenderness of left arm Extremities: no cyanosis or edema. Tenderness of right foot maximally over the medial ankle.   Data Reviewed: Basic Metabolic Panel:  Recent Labs Lab 08/09/14 1436 08/13/14 0645  NA 135 132*  K 3.8 3.8  CL 105 101  CO2 23 23  GLUCOSE 77 93  BUN 5* 7  CREATININE 0.50 0.40*  CALCIUM 8.7 8.5   Liver Function Tests:  Recent Labs Lab 08/09/14 1436  AST 25  ALT 23  ALKPHOS 50  BILITOT 2.1*  PROT 7.1  ALBUMIN 3.6   CBC:  Recent Labs Lab 08/09/14 1436 08/11/14 0905 08/13/14 0645  WBC 14.9* 15.3* 15.0*  NEUTROABS 11.5*  --  10.9*  HGB 9.0* 8.1* 7.7*  HCT 26.1* 23.8* 22.7*  MCV 90.9 91.2 91.9  PLT 479* 406* 452*     Scheduled Meds: . enoxaparin (LOVENOX) injection  40 mg Subcutaneous Q24H  . folic acid  2 mg Oral Daily  . HYDROmorphone PCA 2 mg/mL   Intravenous 6 times per day  . methadone  5 mg Oral QHS  . oxyCODONE  15 mg Oral Q4H  . prenatal multivitamin  1 tablet Oral Q1200  . senna-docusate  1 tablet Oral BID   Continuous Infusions: . dextrose 5 % and 0.45% NaCl 10 mL/hr at 08/13/14  1553    Principal Problem:   Hb-SS disease with crisis   Assessment/Plan: Principal Problem:   Hb-SS disease with crisis  1. Sickle Cell Crisis: Pt reports pain consistent with VOC, slowly improving. Continue Dilaudid PCA at current settings. Avoid Toradol due to pregnancy. 2. Sickle Cell Care: Continue Folic acid 2mg . Avoid Hydrea due to pregnancy. 3. Anemia: Hgb has gone from 8.1 to 7.7 in the yesterday, Will monitor and repeat labs tomorrow. Will transfuse if hgb<7.5 or symptomatic. 4. Pregnancy: Avoid teratogens, continue prenatal vitamin. 5. Constipation: Continue Senokot and Miralax PRN   6. FEN/GI :  Regular diet Continue IV fluids-D5/0.45% Bowel regimen in place  Time spent-15 min Code Status: full DVT Prophylaxis: enoxaparin Family Communication: none Disposition Plan: pending  improvement  Kalman Shan  Pager [phone]. If 7PM-7AM, please contact night-coverage.  08/14/2014, 11:57 AM  LOS: 5 days   Kalman Shan

## 2014-08-14 NOTE — Progress Notes (Signed)
Fetal HR of 175 assessed by Jonne Ply RN.

## 2014-08-14 NOTE — Progress Notes (Signed)
FHR 140 assessed by Lethea Killings

## 2014-08-15 LAB — BASIC METABOLIC PANEL
ANION GAP: 7 (ref 5–15)
BUN: 6 mg/dL (ref 6–23)
CO2: 25 mmol/L (ref 19–32)
Calcium: 9.1 mg/dL (ref 8.4–10.5)
Chloride: 106 mmol/L (ref 96–112)
Creatinine, Ser: 0.57 mg/dL (ref 0.50–1.10)
Glucose, Bld: 91 mg/dL (ref 70–99)
Potassium: 4 mmol/L (ref 3.5–5.1)
SODIUM: 138 mmol/L (ref 135–145)

## 2014-08-15 LAB — CBC WITH DIFFERENTIAL/PLATELET
Basophils Absolute: 0 10*3/uL (ref 0.0–0.1)
Basophils Relative: 0 % (ref 0–1)
EOS ABS: 0 10*3/uL (ref 0.0–0.7)
Eosinophils Relative: 0 % (ref 0–5)
HEMATOCRIT: 25.3 % — AB (ref 36.0–46.0)
HEMOGLOBIN: 8.3 g/dL — AB (ref 12.0–15.0)
LYMPHS ABS: 1.7 10*3/uL (ref 0.7–4.0)
Lymphocytes Relative: 10 % — ABNORMAL LOW (ref 12–46)
MCH: 30.9 pg (ref 26.0–34.0)
MCHC: 32.8 g/dL (ref 30.0–36.0)
MCV: 94.1 fL (ref 78.0–100.0)
MONOS PCT: 7 % (ref 3–12)
Monocytes Absolute: 1.2 10*3/uL — ABNORMAL HIGH (ref 0.1–1.0)
NEUTROS ABS: 14.2 10*3/uL — AB (ref 1.7–7.7)
NRBC: 5 /100{WBCs} — AB
Neutrophils Relative %: 83 % — ABNORMAL HIGH (ref 43–77)
PLATELETS: 465 10*3/uL — AB (ref 150–400)
RBC: 2.69 MIL/uL — ABNORMAL LOW (ref 3.87–5.11)
RDW: 18.5 % — ABNORMAL HIGH (ref 11.5–15.5)
WBC: 17.1 10*3/uL — AB (ref 4.0–10.5)

## 2014-08-15 LAB — RETICULOCYTES
RBC.: 2.69 MIL/uL — AB (ref 3.87–5.11)
Retic Ct Pct: >23 % — ABNORMAL HIGH (ref 0.4–3.1)

## 2014-08-15 LAB — LACTATE DEHYDROGENASE: LDH: 247 U/L (ref 94–250)

## 2014-08-15 MED ORDER — OXYCODONE HCL 15 MG PO TABS: 15.0000 mg | ORAL_TABLET | ORAL | Status: DC | PRN

## 2014-08-15 NOTE — Telephone Encounter (Signed)
Refill request for oxycodone 15mg , and methadone 5mg . LOV 07/21/2014. Please advise. Thanks!

## 2014-08-15 NOTE — Discharge Summary (Addendum)
Physician Discharge Summary  Patient ID: [name] MRN: [id number] DOB/AGE: [birth year] 24 y.o.  Admit date: 08/09/2014 Discharge date: 08/15/2014  Admission Diagnoses:  Discharge Diagnoses:  Principal Problem:   Hb-SS disease with crisis   Discharged Condition: good  Hospital Course: Patient admitted with sickle cell painful crisis. She is [redacted] weeks pregnant. She was treated with Dilaudid PCA, her methadone and oxycodone. She improved gradually. Was transitioned to her oral regimen. At time of discharge she is to follow-up with her primary care physician as well as obstetrician. She was doing much better. No significant complications this admission.  Consults: None  Significant Diagnostic Studies: labs: CBCs, CMP, reticulocyte count where checked. Patient did not require any blood transfusion.  Treatments: IV hydration and analgesia: Dilaudid  Discharge Exam: Blood pressure 119/76, pulse 101, temperature 98.7 F (37.1 C), temperature source Oral, resp. rate 12, height 5\' 2"  (1.575 m), weight 79.107 kg (174 lb 6.4 oz), last menstrual period 03/20/2014, SpO2 97 %. General appearance: alert, cooperative, appears stated age and no distress Head: Normocephalic, without obvious abnormality, atraumatic Eyes: conjunctivae/corneas clear. PERRL, EOM's intact. Fundi benign. Neck: no adenopathy, no carotid bruit, no JVD, supple, symmetrical, trachea midline and thyroid not enlarged, symmetric, no tenderness/mass/nodules Back: symmetric, no curvature. ROM normal. No CVA tenderness. Resp: clear to auscultation bilaterally Chest wall: no tenderness Cardio: regular rate and rhythm, S1, S2 normal, no murmur, click, rub or gallop GI: soft, non-tender; bowel sounds normal; no masses,  no organomegaly and Pregnant uterus consistent with [redacted] weeks gestation Extremities: extremities normal, atraumatic, no cyanosis or edema Pulses: 2+ and symmetric Skin: Skin color, texture, turgor normal. No  rashes or lesions Lymph nodes: Cervical, supraclavicular, and axillary nodes normal. Neurologic: Grossly normal  Disposition: 01-Home or Self Care     Medication List    TAKE these medications        acetaminophen 500 MG tablet  Commonly known as:  TYLENOL  Take 1,000 mg by mouth every 6 (six) hours as needed for moderate pain or headache.     alprazolam 2 MG tablet  Commonly known as:  XANAX  Take 1 tablet by mouth 2 (two) times daily as needed. anxiety     folic acid 1 MG tablet  Commonly known as:  FOLVITE  Take 2 tablets (2 mg total) by mouth daily.     methadone 5 MG tablet  Commonly known as:  DOLOPHINE  Take 1 tablet (5 mg total) by mouth at bedtime.     oxyCODONE 15 MG immediate release tablet  Commonly known as:  ROXICODONE  Take 1 tablet (15 mg total) by mouth every 4 (four) hours as needed for pain.     prenatal multivitamin Tabs tablet  Take 1 tablet by mouth daily at 12 noon.         SignedBarbette Merino 08/15/2014, 12:29 PM  Times pain 33 minutes

## 2014-08-15 NOTE — Progress Notes (Signed)
Wasted 3 ml of high dose dilauded pca with kim osborne.

## 2014-08-15 NOTE — Telephone Encounter (Addendum)
Prescription written for Oxycodone 15 mg  #90 tabs. [facility] reviewed and no inconsistencies noted. Methadone due 08/24/2014

## 2014-08-15 NOTE — Care Management Note (Signed)
CARE MANAGEMENT NOTE 08/15/2014  Patient:  PEARSON, REASONS   Account Number:  [id number]  Date Initiated:  08/12/2014  Documentation initiated by:  CRAFT,TERRI  Subjective/Objective Assessment:   24 year old female admitted 08/09/14 with sickle cell crisis     Action/Plan:   D/C when medically stable   Anticipated DC Date:  08/17/2014   Anticipated DC Plan:  HOME/SELF CARE         Choice offered to / List presented to:             Status of service:  In process, will continue to follow Medicare Important Message given?  YES (If response is "NO", the following Medicare IM given date fields will be blank) Date Medicare IM given:  08/15/2014 Medicare IM given by:  Marney Doctor Date Additional Medicare IM given:  08/12/2014 Additional Medicare IM given by:  [facility] CRAFT  Discharge Disposition:    Per UR Regulation:  Reviewed for med. necessity/level of care/duration of stay  If discussed at [place] of Stay Meetings, dates discussed:    Comments:  08/15/14 Marney Doctor RN,BSN,NCM [phone] Chart reviewed and CM following for DC needs.

## 2014-08-18 ENCOUNTER — Encounter (HOSPITAL_COMMUNITY): Payer: Self-pay | Admitting: *Deleted

## 2014-08-18 ENCOUNTER — Other Ambulatory Visit: Payer: Medicare Other

## 2014-08-18 ENCOUNTER — Emergency Department (HOSPITAL_COMMUNITY)
Admission: EM | Admit: 2014-08-18 | Discharge: 2014-08-19 | Disposition: A | Discharge status: Home / Self Care | Payer: Medicare Other | Attending: Emergency Medicine | Admitting: Emergency Medicine

## 2014-08-18 DIAGNOSIS — Z8679 Personal history of other diseases of the circulatory system: Secondary | ICD-10-CM | POA: Diagnosis not present

## 2014-08-18 DIAGNOSIS — O99352 Diseases of the nervous system complicating pregnancy, second trimester: Secondary | ICD-10-CM | POA: Diagnosis not present

## 2014-08-18 DIAGNOSIS — Z8659 Personal history of other mental and behavioral disorders: Secondary | ICD-10-CM | POA: Diagnosis not present

## 2014-08-18 DIAGNOSIS — O9989 Other specified diseases and conditions complicating pregnancy, childbirth and the puerperium: Secondary | ICD-10-CM | POA: Diagnosis not present

## 2014-08-18 DIAGNOSIS — Z79899 Other long term (current) drug therapy: Secondary | ICD-10-CM | POA: Diagnosis not present

## 2014-08-18 DIAGNOSIS — Z87891 Personal history of nicotine dependence: Secondary | ICD-10-CM | POA: Diagnosis not present

## 2014-08-18 DIAGNOSIS — D5700 Hb-SS disease with crisis, unspecified: Secondary | ICD-10-CM | POA: Diagnosis not present

## 2014-08-18 DIAGNOSIS — G8929 Other chronic pain: Secondary | ICD-10-CM | POA: Diagnosis not present

## 2014-08-18 DIAGNOSIS — Z8719 Personal history of other diseases of the digestive system: Secondary | ICD-10-CM | POA: Diagnosis not present

## 2014-08-18 DIAGNOSIS — R05 Cough: Secondary | ICD-10-CM | POA: Diagnosis not present

## 2014-08-18 DIAGNOSIS — Z91040 Latex allergy status: Secondary | ICD-10-CM | POA: Insufficient documentation

## 2014-08-18 DIAGNOSIS — O99012 Anemia complicating pregnancy, second trimester: Secondary | ICD-10-CM | POA: Insufficient documentation

## 2014-08-18 DIAGNOSIS — Z3A22 22 weeks gestation of pregnancy: Secondary | ICD-10-CM | POA: Insufficient documentation

## 2014-08-18 DIAGNOSIS — Z3490 Encounter for supervision of normal pregnancy, unspecified, unspecified trimester: Secondary | ICD-10-CM

## 2014-08-18 NOTE — ED Notes (Signed)
Pt states that she was discharged from the hospital on Friday after a Sickle Cell Crisis; pt states that she felt better Friday when she left but began to feel worse on Sat; pt c/o back and leg pain; pt states that this is typical sickle cell pain for pt; pt states that she has been unable to sleep; pt states that she is unable to get comfortable; pt states that she has not taken pain meds since noon yesterday bc they were not working

## 2014-08-19 ENCOUNTER — Other Ambulatory Visit (INDEPENDENT_AMBULATORY_CARE_PROVIDER_SITE_OTHER): Payer: Medicare Other

## 2014-08-19 ENCOUNTER — Emergency Department (HOSPITAL_COMMUNITY): Payer: Medicare Other

## 2014-08-19 ENCOUNTER — Other Ambulatory Visit: Payer: Self-pay | Admitting: Internal Medicine

## 2014-08-19 DIAGNOSIS — Z3492 Encounter for supervision of normal pregnancy, unspecified, second trimester: Secondary | ICD-10-CM

## 2014-08-19 DIAGNOSIS — R05 Cough: Secondary | ICD-10-CM | POA: Diagnosis not present

## 2014-08-19 DIAGNOSIS — D571 Sickle-cell disease without crisis: Secondary | ICD-10-CM | POA: Diagnosis not present

## 2014-08-19 DIAGNOSIS — O99012 Anemia complicating pregnancy, second trimester: Secondary | ICD-10-CM | POA: Diagnosis not present

## 2014-08-19 DIAGNOSIS — Z3480 Encounter for supervision of other normal pregnancy, unspecified trimester: Secondary | ICD-10-CM | POA: Diagnosis not present

## 2014-08-19 LAB — COMPREHENSIVE METABOLIC PANEL
ALBUMIN: 4 g/dL (ref 3.5–5.2)
ALT: 24 U/L (ref 0–35)
AST: 25 U/L (ref 0–37)
Alkaline Phosphatase: 70 U/L (ref 39–117)
Anion gap: 9 (ref 5–15)
BUN: 7 mg/dL (ref 6–23)
CO2: 20 mmol/L (ref 19–32)
CREATININE: 0.41 mg/dL — AB (ref 0.50–1.10)
Calcium: 9.5 mg/dL (ref 8.4–10.5)
Chloride: 108 mmol/L (ref 96–112)
GFR calc Af Amer: >90 mL/min (ref >90)
Glucose, Bld: 99 mg/dL (ref 70–99)
Potassium: 3.8 mmol/L (ref 3.5–5.1)
SODIUM: 137 mmol/L (ref 135–145)
TOTAL PROTEIN: 7.6 g/dL (ref 6.0–8.3)
Total Bilirubin: 1.5 mg/dL — ABNORMAL HIGH (ref 0.3–1.2)

## 2014-08-19 LAB — URINE MICROSCOPIC-ADD ON

## 2014-08-19 LAB — URINALYSIS, ROUTINE W REFLEX MICROSCOPIC
Bilirubin Urine: NEGATIVE
Glucose, UA: NEGATIVE mg/dL
Hgb urine dipstick: NEGATIVE
KETONES UR: NEGATIVE mg/dL
NITRITE: NEGATIVE
Protein, ur: NEGATIVE mg/dL
Specific Gravity, Urine: 1.012 (ref 1.005–1.030)
Urobilinogen, UA: 1 mg/dL (ref 0.0–1.0)
pH: 6.5 (ref 5.0–8.0)

## 2014-08-19 LAB — CBC WITH DIFFERENTIAL/PLATELET
BASOS PCT: 0 % (ref 0–1)
Basophils Absolute: 0 10*3/uL (ref 0.0–0.1)
EOS ABS: 0.2 10*3/uL (ref 0.0–0.7)
Eosinophils Relative: 1 % (ref 0–5)
HCT: 28.4 % — ABNORMAL LOW (ref 36.0–46.0)
Hemoglobin: 9.6 g/dL — ABNORMAL LOW (ref 12.0–15.0)
Lymphocytes Relative: 14 % (ref 12–46)
Lymphs Abs: 2.2 10*3/uL (ref 0.7–4.0)
MCH: 31.3 pg (ref 26.0–34.0)
MCHC: 33.8 g/dL (ref 30.0–36.0)
MCV: 92.5 fL (ref 78.0–100.0)
MONOS PCT: 8 % (ref 3–12)
Monocytes Absolute: 1.3 10*3/uL — ABNORMAL HIGH (ref 0.1–1.0)
NEUTROS PCT: 77 % (ref 43–77)
Neutro Abs: 12 10*3/uL — ABNORMAL HIGH (ref 1.7–7.7)
PLATELETS: 588 10*3/uL — AB (ref 150–400)
RBC: 3.07 MIL/uL — ABNORMAL LOW (ref 3.87–5.11)
RDW: 17.3 % — ABNORMAL HIGH (ref 11.5–15.5)
WBC: 15.7 10*3/uL — ABNORMAL HIGH (ref 4.0–10.5)

## 2014-08-19 LAB — RETICULOCYTES
RBC.: 3.07 MIL/uL — ABNORMAL LOW (ref 3.87–5.11)
Retic Count, Absolute: 681.5 10*3/uL — ABNORMAL HIGH (ref 19.0–186.0)
Retic Ct Pct: 22.2 % — ABNORMAL HIGH (ref 0.4–3.1)

## 2014-08-19 MED ORDER — AMMONIA AROMATIC IN INHA
RESPIRATORY_TRACT | Status: AC
  Filled 2014-08-19: qty 10

## 2014-08-19 MED ORDER — DIPHENHYDRAMINE HCL 50 MG/ML IJ SOLN
25.0000 mg | Freq: Once | INTRAMUSCULAR | Status: AC
  Administered 2014-08-19: 25 mg via INTRAVENOUS
  Filled 2014-08-19: qty 1

## 2014-08-19 MED ORDER — SODIUM CHLORIDE 0.9 % IV SOLN: 1000.0000 mL | INTRAVENOUS | Status: DC

## 2014-08-19 MED ORDER — HYDROMORPHONE HCL 1 MG/ML IJ SOLN
1.0000 mg | Freq: Once | INTRAMUSCULAR | Status: AC
  Administered 2014-08-19: 1 mg via INTRAVENOUS
  Filled 2014-08-19: qty 1

## 2014-08-19 MED ORDER — SODIUM CHLORIDE 0.9 % IV SOLN
1000.0000 mL | Freq: Once | INTRAVENOUS | Status: AC
Start: 2014-08-19 — End: 2014-08-19
  Administered 2014-08-19: 1000 mL via INTRAVENOUS

## 2014-08-19 MED ORDER — ONDANSETRON HCL 4 MG/2ML IJ SOLN
4.0000 mg | Freq: Once | INTRAMUSCULAR | Status: AC
  Administered 2014-08-19: 4 mg via INTRAVENOUS
  Filled 2014-08-19: qty 2

## 2014-08-19 MED ORDER — HYDROMORPHONE HCL 2 MG/ML IJ SOLN
2.0000 mg | Freq: Once | INTRAMUSCULAR | Status: AC
  Administered 2014-08-19: 2 mg via INTRAVENOUS
  Filled 2014-08-19: qty 1

## 2014-08-19 MED ORDER — SODIUM CHLORIDE 0.9 % IV SOLN
1000.0000 mL | Freq: Once | INTRAVENOUS | Status: AC
  Administered 2014-08-19: 1000 mL via INTRAVENOUS

## 2014-08-19 MED ORDER — CEFTRIAXONE SODIUM 1 G IJ SOLR
1.0000 g | Freq: Once | INTRAMUSCULAR | Status: AC
  Administered 2014-08-19: 1 g via INTRAVENOUS
  Filled 2014-08-19: qty 10

## 2014-08-19 NOTE — ED Notes (Signed)
Patient transported to X-ray 

## 2014-08-19 NOTE — ED Notes (Signed)
Pt leaving to make appt with [name].

## 2014-08-19 NOTE — ED Provider Notes (Signed)
CSN: [id number]     Arrival date & time 08/18/14  2304 History   First MD Initiated Contact with Patient 08/19/14 0404     Chief Complaint  Patient presents with  . Sickle Cell Pain Crisis     (Consider location/radiation/quality/duration/timing/severity/associated sxs/prior Treatment) HPI  Patient is G2 P0 Ab1, she states she is currently [redacted] weeks pregnant and followed at the women's clinic at [facility]. She states she was discharged from the hospital on April 1 after being admitted for 1 week. She states the following day she didn't feel well because she felt weak and started having pain. She states her pain is been getting progressively worse and is mainly in her back. She denies fever or chest pain but states she's had a mild cough. She states she felt dizzy and lightheaded and passed out in the lobby tonight. She has had mild nausea without vomiting. She complains of constipation. She states she is feeling the baby moving and kicking and it is "irritating". She states it is keeping her awake at night.    PCP [name]  Past Medical History  Diagnosis Date  . H/O: 1 miscarriage 03/22/2011    Pt reports 2 miscarriages.  [name] 01/08/2009  . Depression 01/06/2011  . GERD (gastroesophageal reflux disease) 02/17/2011  . Trichotillomania     h/o  . Blood transfusion     "lots"  . Sickle cell anemia with crisis   . Exertional dyspnea     "sometimes"  . Sickle cell anemia   . Headache(784.0)   . Migraines 11/08/11    "@ least twice/month"  . Chronic back pain     "very severe; have knot in my back; from tight muscle; take RX and exercise for it"  . Mood swings 11/08/11    "I go back and forth; real bad"  . Pregnancy   . Blood dyscrasia     SICKLE CELL   Past Surgical History  Procedure Laterality Date  . Cholecystectomy  05/2010  . Dilation and curettage of uterus  02/20/11    S/P miscarriage   Family History  Problem Relation Age of Onset  . Alcoholism     . Depression    . Hypercholesterolemia    . Hypertension    . Migraines    . Diabetes Maternal Grandmother   . Diabetes Paternal Grandmother   . Diabetes Maternal Grandfather   . Sickle cell trait Mother   . Sickle cell trait Father    History  Substance Use Topics  . Smoking status: Former Smoker -- 0.25 packs/day for 1 years    Types: Cigarettes    Quit date: 03/25/2013  . Smokeless tobacco: Never Used  . Alcohol Use: No     Comment: pt states she quit marijuan in May 2013. Rare ETOH, + cigarettes.  She is enrolled in school   Out on disabilitly while pregnant (was working at [employer])  OB History    Gravida Para Term Preterm AB TAB SAB Ectopic Multiple Living   2    1  1          Obstetric Comments   Miscarried in October 2012 at about 7 weeks     Review of Systems  All other systems reviewed and are negative.     Allergies  Carrot oil and Latex  Home Medications   Prior to Admission medications   Medication Sig Start Date End Date Taking? Authorizing Provider  acetaminophen (TYLENOL) 500 MG tablet Take 1,000  mg by mouth every 6 (six) hours as needed for moderate pain or headache.   Yes Historical Provider, MD  folic acid (FOLVITE) 1 MG tablet Take 2 tablets (2 mg total) by mouth daily. Patient taking differently: Take 1 mg by mouth daily.  04/24/14  Yes Dorena Dew, FNP  methadone (DOLOPHINE) 5 MG tablet Take 1 tablet (5 mg total) by mouth at bedtime. 07/21/14  Yes [name], MD  oxyCODONE (ROXICODONE) 15 MG immediate release tablet Take 1 tablet (15 mg total) by mouth every 4 (four) hours as needed for pain. 08/15/14  Yes [name], MD  Prenatal Vit-Fe Fumarate-FA (PRENATAL MULTIVITAMIN) TABS tablet Take 1 tablet by mouth daily at 12 noon.   Yes Historical Provider, MD   ED Triage Vitals  Enc Vitals Group     BP 08/18/14 2323 115/71 mmHg     Pulse Rate 08/18/14 2323 106     Resp 08/18/14 2323 18     Temp 08/18/14 2323 98.5 F (36.9  C)     Temp Source 08/18/14 2323 Oral     SpO2 08/18/14 2323 100 %     Weight 08/18/14 2323 163 lb (73.936 kg)     Height 08/18/14 2323 5\' 1"  (1.549 m)     Head Cir --      Peak Flow --      Pain Score 08/18/14 2329 9     Pain Loc --      Pain Edu? --      Excl. in [place]? --    Vital signs normal   Physical Exam  Constitutional: She is oriented to person, place, and time. She appears well-developed and well-nourished.  Non-toxic appearance. She does not appear ill. She appears distressed.  HENT:  Head: Normocephalic and atraumatic.  Right Ear: External ear normal.  Left Ear: External ear normal.  Nose: Nose normal. No mucosal edema or rhinorrhea.  Mouth/Throat: Oropharynx is clear and moist and mucous membranes are normal. No dental abscesses or uvula swelling.  Eyes: Conjunctivae and EOM are normal. Pupils are equal, round, and reactive to light.  Neck: Normal range of motion and full passive range of motion without pain. Neck supple.  Cardiovascular: Normal rate, regular rhythm and normal heart sounds.  Exam reveals no gallop and no friction rub.   No murmur heard. Pulmonary/Chest: Effort normal and breath sounds normal. No respiratory distress. She has no wheezes. She has no rhonchi. She has no rales. She exhibits no tenderness and no crepitus.  Abdominal: Soft. Normal appearance and bowel sounds are normal. She exhibits no distension. There is no tenderness. There is no rebound and no guarding.  Musculoskeletal: Normal range of motion. She exhibits no edema or tenderness.  Moves all extremities well.   Neurological: She is alert and oriented to person, place, and time. She has normal strength. No cranial nerve deficit.  Skin: Skin is warm, dry and intact. No rash noted. No erythema. No pallor.  Psychiatric: She has a normal mood and affect. Her speech is normal and behavior is normal. Her mood appears not anxious.  Nursing note and vitals reviewed.   ED Course  Procedures  (including critical care time)  Medications  0.9 %  sodium chloride infusion (0 mLs Intravenous Stopped 08/19/14 0723)    Followed by  0.9 %  sodium chloride infusion (1,000 mLs Intravenous New Bag/Given 08/19/14 0724)    Followed by  0.9 %  sodium chloride infusion (not administered)  HYDROmorphone (DILAUDID)  injection 1 mg (1 mg Intravenous Given 08/19/14 0505)  diphenhydrAMINE (BENADRYL) injection 25 mg (25 mg Intravenous Given 08/19/14 0502)  ondansetron (ZOFRAN) injection 4 mg (4 mg Intravenous Given 08/19/14 0502)  cefTRIAXone (ROCEPHIN) 1 g in dextrose 5 % 50 mL IVPB (0 g Intravenous Stopped 08/19/14 0554)  HYDROmorphone (DILAUDID) injection 1 mg (1 mg Intravenous Given 08/19/14 0616)  HYDROmorphone (DILAUDID) injection 2 mg (2 mg Intravenous Given 08/19/14 0802)   FHT 120  Patient given IV fluids and pain medication.  Labs Review Results for orders placed or performed during the hospital encounter of 08/18/14  CBC WITH DIFFERENTIAL  Result Value Ref Range   WBC 15.7 (H) 4.0 - 10.5 K/uL   RBC 3.07 (L) 3.87 - 5.11 MIL/uL   Hemoglobin 9.6 (L) 12.0 - 15.0 g/dL   HCT 28.4 (L) 36.0 - 46.0 %   MCV 92.5 78.0 - 100.0 fL   MCH 31.3 26.0 - 34.0 pg   MCHC 33.8 30.0 - 36.0 g/dL   RDW 17.3 (H) 11.5 - 15.5 %   Platelets 588 (H) 150 - 400 K/uL   Neutrophils Relative % 77 43 - 77 %   Neutro Abs 12.0 (H) 1.7 - 7.7 K/uL   Lymphocytes Relative 14 12 - 46 %   Lymphs Abs 2.2 0.7 - 4.0 K/uL   Monocytes Relative 8 3 - 12 %   Monocytes Absolute 1.3 (H) 0.1 - 1.0 K/uL   Eosinophils Relative 1 0 - 5 %   Eosinophils Absolute 0.2 0.0 - 0.7 K/uL   Basophils Relative 0 0 - 1 %   Basophils Absolute 0.0 0.0 - 0.1 K/uL  Comprehensive metabolic panel  Result Value Ref Range   Sodium 137 135 - 145 mmol/L   Potassium 3.8 3.5 - 5.1 mmol/L   Chloride 108 96 - 112 mmol/L   CO2 20 19 - 32 mmol/L   Glucose, Bld 99 70 - 99 mg/dL   BUN 7 6 - 23 mg/dL   Creatinine, Ser 0.41 (L) 0.50 - 1.10 mg/dL   Calcium 9.5 8.4 -  10.5 mg/dL   Total Protein 7.6 6.0 - 8.3 g/dL   Albumin 4.0 3.5 - 5.2 g/dL   AST 25 0 - 37 U/L   ALT 24 0 - 35 U/L   Alkaline Phosphatase 70 39 - 117 U/L   Total Bilirubin 1.5 (H) 0.3 - 1.2 mg/dL   GFR calc non Af Amer >90 >90 mL/min   GFR calc Af Amer >90 >90 mL/min   Anion gap 9 5 - 15  Reticulocytes  Result Value Ref Range   Retic Ct Pct 22.2 (H) 0.4 - 3.1 %   RBC. 3.07 (L) 3.87 - 5.11 MIL/uL   Retic Count, Manual 681.5 (H) 19.0 - 186.0 K/uL  Urinalysis, Routine w reflex microscopic  Result Value Ref Range   Color, Urine YELLOW YELLOW   APPearance CLOUDY (A) CLEAR   Specific Gravity, Urine 1.012 1.005 - 1.030   pH 6.5 5.0 - 8.0   Glucose, UA NEGATIVE NEGATIVE mg/dL   Hgb urine dipstick NEGATIVE NEGATIVE   Bilirubin Urine NEGATIVE NEGATIVE   Ketones, ur NEGATIVE NEGATIVE mg/dL   Protein, ur NEGATIVE NEGATIVE mg/dL   Urobilinogen, UA 1.0 0.0 - 1.0 mg/dL   Nitrite NEGATIVE NEGATIVE   Leukocytes, UA SMALL (A) NEGATIVE  Urine microscopic-add on  Result Value Ref Range   Squamous Epithelial / LPF MANY (A) RARE   WBC, UA 3-6 <3 WBC/hpf   RBC / HPF  0-2 <3 RBC/hpf   Bacteria, UA RARE RARE   Laboratory interpretation all normal except stable anemia stable elevated to count questionable UTI     Imaging Review Dg Chest 2 View  08/19/2014   CLINICAL DATA:  Sickle cell crisis. Body aches and slight cough. Patient is pregnant with abdominal shielding.  EXAM: CHEST  2 VIEW  COMPARISON:  08/09/2014  FINDINGS: Normal heart size and pulmonary vascularity. No focal airspace disease or consolidation in the lungs. No blunting of costophrenic angles. No pneumothorax. Mediastinal contours appear intact. Bone changes consistent with sickle cell.  IMPRESSION: No active cardiopulmonary disease.   Electronically Signed   By: Lucienne Capers M.D.   On: 08/19/2014 05:37     EKG Interpretation None      MDM   Final diagnoses:  Sickle-cell crisis  Pregnancy    Plan discharge  Rolland Porter, MD, Barbette Or, MD 08/19/14 [phone]

## 2014-08-19 NOTE — ED Notes (Signed)
Iv access attempted several times, pt waiting for a room

## 2014-08-19 NOTE — ED Notes (Signed)
Returned from xray

## 2014-08-19 NOTE — Discharge Instructions (Signed)
Drink plenty of fluids. Follow up with [name] as needed.  Keep your prenatal appointments.

## 2014-08-20 LAB — CBC WITH DIFFERENTIAL/PLATELET
BASOS ABS: 0 10*3/uL (ref 0.0–0.1)
BASOS PCT: 0 % (ref 0–1)
EOS PCT: 1 % (ref 0–5)
Eosinophils Absolute: 0.2 10*3/uL (ref 0.0–0.7)
HCT: 27.4 % — ABNORMAL LOW (ref 36.0–46.0)
Hemoglobin: 9 g/dL — ABNORMAL LOW (ref 12.0–15.0)
Lymphocytes Relative: 16 % (ref 12–46)
Lymphs Abs: 2.6 10*3/uL (ref 0.7–4.0)
MCH: 31 pg (ref 26.0–34.0)
MCHC: 32.8 g/dL (ref 30.0–36.0)
MCV: 94.5 fL (ref 78.0–100.0)
MPV: 9.4 fL (ref 8.6–12.4)
Monocytes Absolute: 1.2 10*3/uL — ABNORMAL HIGH (ref 0.1–1.0)
Monocytes Relative: 7 % (ref 3–12)
Neutro Abs: 12.5 10*3/uL — ABNORMAL HIGH (ref 1.7–7.7)
Neutrophils Relative %: 76 % (ref 43–77)
PLATELETS: 506 10*3/uL — AB (ref 150–400)
RBC: 2.9 MIL/uL — AB (ref 3.87–5.11)
RDW: 17.9 % — AB (ref 11.5–15.5)
WBC: 16.5 10*3/uL — ABNORMAL HIGH (ref 4.0–10.5)

## 2014-08-20 LAB — URINE CULTURE

## 2014-08-21 ENCOUNTER — Encounter: Payer: Medicare Other | Admitting: Physician Assistant

## 2014-08-21 ENCOUNTER — Other Ambulatory Visit: Payer: Medicare Other

## 2014-08-21 LAB — HEMOGLOBINOPATHY EVALUATION
HEMOGLOBIN OTHER: 0 %
HGB S QUANTITAION: 68.6 % — AB
Hgb A2 Quant: 2.7 % (ref 2.2–3.2)
Hgb A: 10.5 % — ABNORMAL LOW (ref 96.8–97.8)
Hgb F Quant: 18.2 % — ABNORMAL HIGH (ref 0.0–2.0)

## 2014-08-25 ENCOUNTER — Ambulatory Visit: Payer: Medicare Other | Admitting: Internal Medicine

## 2014-08-26 ENCOUNTER — Telehealth: Payer: Self-pay

## 2014-08-26 ENCOUNTER — Telehealth (HOSPITAL_COMMUNITY): Payer: Self-pay | Admitting: Hematology

## 2014-08-26 DIAGNOSIS — D571 Sickle-cell disease without crisis: Secondary | ICD-10-CM

## 2014-08-26 MED ORDER — METHADONE HCL 5 MG PO TABS
5.0000 mg | ORAL_TABLET | Freq: Every day | ORAL | Status: DC
Start: 2014-08-26 — End: 2014-09-26

## 2014-08-26 NOTE — Telephone Encounter (Signed)
Prescription written for Methadone #30 tabs. NCCSRS reviewed and no inconsistencies noted.

## 2014-08-26 NOTE — Telephone Encounter (Signed)
Refill request for  methadone 5mg . LOV 07/21/2014. Please advise. Thanks!   Refill request was called in on 08/14/2014. Please advise. Thanks!

## 2014-08-26 NOTE — Telephone Encounter (Signed)
Left message for patient to return my call in regards to scheduling her for an exchange transfusion this week on Thursday or Friday.  I need to know what time she is available so I can pre-schedule it with the IV team.  Will wait for patient to return my call.

## 2014-08-27 ENCOUNTER — Telehealth (HOSPITAL_COMMUNITY): Payer: Self-pay | Admitting: Hematology

## 2014-08-27 ENCOUNTER — Other Ambulatory Visit: Payer: Self-pay | Admitting: Internal Medicine

## 2014-08-27 DIAGNOSIS — D571 Sickle-cell disease without crisis: Secondary | ICD-10-CM

## 2014-08-27 DIAGNOSIS — Z3492 Encounter for supervision of normal pregnancy, unspecified, second trimester: Secondary | ICD-10-CM

## 2014-08-27 NOTE — Telephone Encounter (Signed)
Spoke with patient and reminded her that she needed to come in today to have her labs drawn in order to do the exchange transfusion tomorrow.  Patient is trying to get a ride but states she will be here today by 4:00 for lab work. Patient also confirmed the appointment for the exchange transfusion tomorrow at 0900.

## 2014-08-27 NOTE — Telephone Encounter (Signed)
Message left advising patient that she previously stated she was coming in by 4pm to have labs drawn to that the exchange transfusion could be scheduled for tomorrow.  I asked that patient return call to [facility] as soon as possible.

## 2014-08-27 NOTE — Telephone Encounter (Signed)
Patient returned my call and advised that she is unable to get transportation here today.  Patient requested to schedule procedure next week.  Spoke with Dr. Zigmund Daniel and she is ok with it.  Patient to come 09-02-2014 to be type/screened, and then return on 09/03/2014 for exchange/transfusion.  Fredrik Cove (IV team AD) notified as well for scheduled exchange.

## 2014-08-28 ENCOUNTER — Encounter: Payer: Medicare Other | Admitting: Advanced Practice Midwife

## 2014-08-28 ENCOUNTER — Encounter: Payer: Medicare Other | Admitting: Obstetrics & Gynecology

## 2014-09-02 ENCOUNTER — Non-Acute Institutional Stay (HOSPITAL_COMMUNITY)
Admission: AD | Admit: 2014-09-02 | Discharge: 2014-09-02 | Disposition: A | Discharge status: Home / Self Care | Payer: Medicare Other | Source: Ambulatory Visit | Attending: Internal Medicine | Admitting: Internal Medicine

## 2014-09-02 DIAGNOSIS — K5900 Constipation, unspecified: Secondary | ICD-10-CM | POA: Diagnosis not present

## 2014-09-02 DIAGNOSIS — D5700 Hb-SS disease with crisis, unspecified: Secondary | ICD-10-CM | POA: Insufficient documentation

## 2014-09-02 DIAGNOSIS — O99012 Anemia complicating pregnancy, second trimester: Secondary | ICD-10-CM | POA: Diagnosis not present

## 2014-09-02 DIAGNOSIS — M79671 Pain in right foot: Secondary | ICD-10-CM | POA: Diagnosis present

## 2014-09-02 DIAGNOSIS — Z3A16 16 weeks gestation of pregnancy: Secondary | ICD-10-CM | POA: Diagnosis not present

## 2014-09-02 LAB — PREPARE RBC (CROSSMATCH)

## 2014-09-02 NOTE — Progress Notes (Signed)
Patient in for type and cross.  Blood band placed.  Patient instructed to leave band in place until she returns on tomorrow.  Patient scheduled for blood exchange/transfuion on 09/03/2014.  IV team is aware.

## 2014-09-02 NOTE — H&P (Signed)
19PATIENT NAME:  [name], [name] MR#:  [id number] DATE OF BIRTH:  October 30, 1990  DATE OF ADMISSION:  05/06/2012  PRIMARY CARE PHYSICIAN: Dr. Marlou Sa at the [facility] in [place].   CHIEF COMPLAINT: Syncope and pain, right shoulder and back.   HISTORY OF PRESENTING ILLNESS: A 24 year old female patient with history of sickle cell disease. Presents to the Emergency Room complaining of syncopal episode with significant pain at church. Her significant other at the bedside mentions that she passed out for about 30 seconds and woke up without any seizures or incontinence, without any postepisode confusion. The patient presently is doing well. EKG shows no acute changes. Cardiac enzymes have been normal. The patient has been found to have anemia of 7.9. Her baseline hemoglobin is 8.5. Retic count is 19  The patient had a recent miscarriage 3 weeks prior with profuse bleeding which has resolved. She did see her obstetrician after this episode. Presently, she mentions that she is going through a menstrual period, and this is when she gets most of her sickle cell crisis.   The patient has received multiple doses of Dilaudid in the ER without good control of pain medications and is being admitted to the hospitalist service for further pain control. The patient also has elevated white count and possibility of infection.   PAST MEDICAL HISTORY: Sickle cell disease, tobacco abuse.   SOCIAL HISTORY: The patient smokes on and off. Alcohol occasionally. No illicit drugs.   CODE STATUS: FULL CODE.   FAMILY HISTORY: History of sickle cell disease.   HOME MEDICATIONS: The patient takes oxycodone 15 mg as needed for her pain.   ALLERGIES: KETOROLAC.   REVIEW OF SYSTEMS:  CONSTITUTIONAL: Complains of weakness but no fatigue of fever.   EYES: No blurred vision, pain, redness.  ENT: No tinnitus, ear pain, hearing loss.  RESPIRATORY: No cough, wheeze, hemoptysis.  CARDIOVASCULAR: No chest pain,  orthopnea, edema.  GASTROINTESTINAL: No nausea, vomiting, diarrhea, abdominal pain.  GENITOURINARY: No dysuria, hematuria.  GYNECOLOGIC: Recent miscarriage.  ENDOCRINE: No polyuria, nocturia, thyroid problems.  INTEGUMENTARY: No acne, rash, lesions.  MUSCULOSKELETAL: Has pain in the right shoulder and the left shoulder pain since the fall.  NEUROLOGIC: No numbness, weakness, dysarthria.  PSYCHIATRIC: No anxiety or depression.   PHYSICAL EXAMINATION:  VITAL SIGNS: Show temperature of 98.3, pulse 108, respirations 18, blood pressure 112/72, saturating 97% on room air.  GENERAL: Obese African American female patient lying in bed, comfortable, conversational, cooperative with exam.  PSYCHIATRIC: Alert, oriented x3. Mood and affect appropriate. Judgment intact.  HEENT: Atraumatic, normocephalic. Oral mucosa moist and pink. External ears and nose normal. No pallor. No icterus. Pupils are equal and reactive to light.  NECK: Supple. No thyromegaly. No palpable lymph nodes. Trachea midline. No carotid bruit, JVD.  CARDIOVASCULAR: S1, S2, tachycardic without any murmurs. Peripheral pulses 2+. No edema.  RESPIRATORY: Normal work of breathing. Clear to auscultation on both sides.  GASTROINTESTINAL: Soft abdomen, nontender. Bowel sounds present. No hepatosplenomegaly palpable.  SKIN: Warm and dry. No petechiae, rash, ulcers.  MUSCULOSKELETAL: Has pain in the right shoulder and left shoulder without any swelling or redness. Back tenderness, upper back.  NEUROLOGICAL: Motor strength 5/5 in upper and lower extremities. Sensation to fine touch intact all over. Cranial nerves II through XII intact.   LAB STUDIES: Show creatinine 0.57. Pregnancy test negative. WBC 16, hemoglobin 7.9. Retic count 19.75. Urinalysis negative. Influenza negative.   ASSESSMENT AND PLAN:  1. Sickle cell pain crisis: The patient's hemoglobin is  7.9. Retic count is at 19, significantly high. Will start her on hydromorphone pain  medication along with intravenous fluids. Will monitor her hemoglobin.  2. Leukocytosis: Will have to rule out infection as infections can cause sickle cell crisis. Will get blood cultures. Urinalysis has been negative at this time.  3. Syncope: Likely vasovagal from the pain. Cardiac enzymes are normal. EKG no acute changes. No further workup needed at this time.  4. Deep vein thrombosis prophylaxis: Lovenox.   CODE STATUS: FULL CODE.   Time spent on this case was 35 minutes.    ____________________________ Leia Alf Britnay Magnussen, MD srs:gb D: 05/06/2012 22:11:01 ET T: 05/06/2012 22:52:30 ET JOB#: [id number]  cc: Alveta Heimlich R. Evaan Tidwell, MD, <Dictator> Dr. Marlou Sa, [facility], [facility] Arlice Colt MD ELECTRONICALLY SIGNED 05/10/2012 14:05

## 2014-09-03 ENCOUNTER — Other Ambulatory Visit: Payer: Self-pay | Admitting: Internal Medicine

## 2014-09-03 ENCOUNTER — Non-Acute Institutional Stay (HOSPITAL_COMMUNITY)
Admission: AD | Admit: 2014-09-03 | Discharge: 2014-09-03 | Disposition: A | Discharge status: Home / Self Care | Payer: Medicare Other | Source: Ambulatory Visit | Attending: Internal Medicine | Admitting: Internal Medicine

## 2014-09-03 DIAGNOSIS — Z3492 Encounter for supervision of normal pregnancy, unspecified, second trimester: Secondary | ICD-10-CM

## 2014-09-03 DIAGNOSIS — D571 Sickle-cell disease without crisis: Secondary | ICD-10-CM

## 2014-09-03 MED ORDER — SODIUM CHLORIDE 0.9 % IV SOLN: Freq: Once | INTRAVENOUS | Status: DC

## 2014-09-03 NOTE — Progress Notes (Signed)
Patient can't get over to [facility] today.  Patient scheduled @ 10:00 09/04/2014 in IR here at [facility].  Afterwards, patient will come to [facility] for exchange/transfusion.  IV team notified so they can preform the exchange prior to transfusion.  Patient understands to keep her blood band on until she returns tomorrow.

## 2014-09-03 NOTE — Progress Notes (Signed)
Spoke with RN in [facility] regarding PICC insertion.   IV Team does not insert her PICCs.   Interventional Radiology inserts her PICCs.   RN made aware of this.

## 2014-09-03 NOTE — Progress Notes (Signed)
Patient arrived at [facility] for exchange/transfusion.  Verified verbally with Dr.Matthews that sign/held orders are the same.  Orders released.  Blood is ready per blood bank.  IV team has been paged for exchange.

## 2014-09-03 NOTE — Progress Notes (Signed)
PICC returned call.  They are unable to place PICC line for patient due to scar tissue.  PICC needs to be placed by IR.

## 2014-09-03 NOTE — Progress Notes (Signed)
IV team nurse arrived and was unable to get IV access on patient.  Ultrasound machine was not available.  Will notify MD.

## 2014-09-03 NOTE — Progress Notes (Signed)
Spoke with Dr. Zigmund Daniel and patient will need PICC line.  See orders.

## 2014-09-04 ENCOUNTER — Non-Acute Institutional Stay (HOSPITAL_COMMUNITY)
Admission: AD | Admit: 2014-09-04 | Discharge: 2014-09-04 | Disposition: A | Discharge status: Home / Self Care | Payer: Medicare Other | Source: Ambulatory Visit | Attending: Internal Medicine | Admitting: Internal Medicine

## 2014-09-04 ENCOUNTER — Ambulatory Visit (HOSPITAL_COMMUNITY)
Admit: 2014-09-04 | Discharge: 2014-09-04 | Disposition: A | Discharge status: Home / Self Care | Payer: Medicare Other | Attending: Internal Medicine | Admitting: Internal Medicine

## 2014-09-04 ENCOUNTER — Other Ambulatory Visit: Payer: Self-pay | Admitting: Internal Medicine

## 2014-09-04 DIAGNOSIS — Z3492 Encounter for supervision of normal pregnancy, unspecified, second trimester: Secondary | ICD-10-CM

## 2014-09-04 DIAGNOSIS — D571 Sickle-cell disease without crisis: Secondary | ICD-10-CM

## 2014-09-04 DIAGNOSIS — O99019 Anemia complicating pregnancy, unspecified trimester: Secondary | ICD-10-CM

## 2014-09-04 DIAGNOSIS — O99012 Anemia complicating pregnancy, second trimester: Secondary | ICD-10-CM | POA: Diagnosis not present

## 2014-09-04 DIAGNOSIS — D5720 Sickle-cell/Hb-C disease without crisis: Secondary | ICD-10-CM

## 2014-09-04 DIAGNOSIS — Z452 Encounter for adjustment and management of vascular access device: Secondary | ICD-10-CM | POA: Diagnosis not present

## 2014-09-04 LAB — TYPE AND SCREEN
ABO/RH(D): B POS
ANTIBODY SCREEN: POSITIVE
DAT, IGG: NEGATIVE
DONOR AG TYPE: NEGATIVE
Donor AG Type: NEGATIVE
UNIT DIVISION: 0
Unit division: 0

## 2014-09-04 LAB — PREPARE RBC (CROSSMATCH)

## 2014-09-04 MED ORDER — LIDOCAINE HCL 1 % IJ SOLN
INTRAMUSCULAR | Status: AC
  Filled 2014-09-04: qty 20

## 2014-09-04 MED ORDER — SODIUM CHLORIDE 0.9 % IJ SOLN
10.0000 mL | INTRAMUSCULAR | Status: DC | PRN
  Administered 2014-09-04: 10 mL
  Filled 2014-09-04: qty 10

## 2014-09-04 MED ORDER — SODIUM CHLORIDE 0.9 % IV SOLN: Freq: Once | INTRAVENOUS | Status: DC

## 2014-09-04 MED ORDER — HEPARIN SOD (PORK) LOCK FLUSH 100 UNIT/ML IV SOLN
250.0000 [IU] | INTRAVENOUS | Status: DC | PRN
  Administered 2014-09-04 (×2): 250 [IU]
  Filled 2014-09-04 (×2): qty 5

## 2014-09-04 NOTE — Procedures (Signed)
[facility]  Procedure Note  [name] OQH:[id number] DOB: 30-Nov-1990 DOA: 09/04/2014   PCP: MATTHEWS,MICHELLE A., MD   Associated Diagnosis: Sickle cell anemia without crisis   Procedure Note: Therapeutic phlebotomy; transfusion of 1 unit PRBCs   Condition During Procedure:  Pt tolerated well   Condition at Discharge:  No complications noted   Nigel Sloop, [facility]

## 2014-09-04 NOTE — Procedures (Signed)
LEFT arm PowerPICC placed under US and fluoroscopy No ptx on spot chest radiograph. No complication No blood loss. See complete dictation in [facility] PACS.  

## 2014-09-04 NOTE — Progress Notes (Signed)
Pt had PICC line placed today in IR; pt informed about proper care of PICC line; pt informed that PICC line dressing change and flush to be done every 7 days per protocol; Pt made aware that she will need to come to day hospital on 09/11/14 for dressing change; pt verbalizes understanding; PICC line flushed and saline locked; no complications noted

## 2014-09-04 NOTE — Progress Notes (Signed)
Pt arrived for scheduled blood exchange and transfusion; blue blood armband had been removed prior to arrival; type and screen sample taken to blood bank; will continue to monitor

## 2014-09-05 ENCOUNTER — Telehealth: Payer: Self-pay | Admitting: Internal Medicine

## 2014-09-05 DIAGNOSIS — D571 Sickle-cell disease without crisis: Secondary | ICD-10-CM

## 2014-09-05 DIAGNOSIS — G8929 Other chronic pain: Secondary | ICD-10-CM

## 2014-09-05 NOTE — Discharge Summary (Signed)
PATIENT NAME:  [name], [name] MR#:  [id number] DATE OF BIRTH:  [birth year]  DATE OF ADMISSION:  05/06/2012  DATE OF DISCHARGE:  05/11/2012  PRIMARY CARE PHYSICIAN: No local.  DISCHARGE DIAGNOSES:  1. Sickle cell pain crisis with anemia.  2. Leukocytosis, possibly due to reaction.  3. Syncope, possible vasovagal reaction.   CODE STATUS: FULL CODE.   CONDITION: Stable.   HOME MEDICATIONS:  1. Hydroxyurea 500 mg p.o. b.i.d.  2. Oxycodone 10 mg 1 tablet q. 6 hours p.r.n. for pain for 3 days.   DIET: Regular diet.   ACTIVITY: As tolerated.   FOLLOW-UP CARE: Follow up PCP within 1 to 2 weeks. According to the patient's oncologist in  [name], Dr. Marlou Sa, the patient can be discharged to home. If she is still has body pain, she can go to [place] at [facility] and monitor her for 24 hours, then decide whether she needs admission or not.   REASON FOR ADMISSION: Syncope and pain, right shoulder and the back.   HOSPITAL COURSE: The patient is a 24 year old African American female with a history of sickle cell disease and tobacco abuse, presented to the ED with syncope and body pain in shoulder and the back. The patient was found to have an anemia with hemoglobin at 7.9. Her baseline was 8.5, retic was 19. She was admitted for sickle cell crisis. For detailed history and physical examination, please refer to the admission note dictated by Dr. Darvin Neighbours. After admission, the patient had been treated Dilaudid 2 to 4 mg every 2 hours, but the patient continuously complained of body pain and requests 4 mg every 2 hours with Phenergan IV. Since hemoglobin decreased to 7.0, the patient got 1 unit of PRBC transfusion. Hemoglobin increased to 8.7 today. The patient's hemoglobin has been stable.   For leukocytosis, which is possibly due to reaction, but no source of infection. Chest x-ray is fine. Urinalysis is negative. The patient's white count was 16 and then increased to  19.4 and today decreased to 14.2. After admission, I called Dr. Marlou Sa, who is the patient's oncologist. He suggested the patient has sickle cell crisis, needed pain management. The patient was on 2 to 4 mg Dilaudid q. 2 hours. The patient continuously complained of body pain every day, but she did not complain of pain today, but she said that she feels not good enough to go home. Clinically, she is stable, but she does not want to go home. I discussed the patient about her situation. Also called Dr. Marlou Sa who suggested the patient can be discharged to home, but if she still has body pain, she can go to [place] in [facility] and monitor there for 24 hours and then decide whether patient needs admission. The patient said that she does not want to go to [facility] because of previous bad experience and that she does not want to go home. Initially she said that she stay in the hospital. The patient is a very upset  with tapered Dilaudid 2 mg p.o. q. 4 to 6 hours. She said that she wants to be discharged and go to [facility]. The patient also called patient relation. The patient is clinically stable, she will be discharged today. I discussed the patient's discharge plan with the patient and case manager.   TIME SPENT: About 53 minutes.    ____________________________ Demetrios Loll, MD qc:jm D: 05/11/2012 12:45:56 ET T: 05/12/2012 17:34:27  ET JOB#: [id number]  cc: Demetrios Loll, MD, <Dictator> Demetrios Loll MD ELECTRONICALLY SIGNED 05/19/2012 18:12

## 2014-09-05 NOTE — Telephone Encounter (Signed)
Refill request for oxycodone 15mg . LOV 07/22/2014. Please advise. Thanks!

## 2014-09-07 ENCOUNTER — Inpatient Hospital Stay (HOSPITAL_COMMUNITY)
Admission: AD | Admit: 2014-09-07 | Discharge: 2014-09-10 | DRG: 781 | Disposition: A | Discharge status: Home / Self Care | Payer: Medicare Other | Attending: Obstetrics & Gynecology | Admitting: Obstetrics & Gynecology

## 2014-09-07 ENCOUNTER — Encounter (HOSPITAL_COMMUNITY): Payer: Self-pay

## 2014-09-07 DIAGNOSIS — N764 Abscess of vulva: Secondary | ICD-10-CM | POA: Diagnosis not present

## 2014-09-07 DIAGNOSIS — Z331 Pregnant state, incidental: Secondary | ICD-10-CM | POA: Diagnosis not present

## 2014-09-07 DIAGNOSIS — O99012 Anemia complicating pregnancy, second trimester: Secondary | ICD-10-CM | POA: Diagnosis present

## 2014-09-07 DIAGNOSIS — D571 Sickle-cell disease without crisis: Secondary | ICD-10-CM | POA: Diagnosis not present

## 2014-09-07 DIAGNOSIS — Z3A24 24 weeks gestation of pregnancy: Secondary | ICD-10-CM | POA: Diagnosis present

## 2014-09-07 DIAGNOSIS — D5700 Hb-SS disease with crisis, unspecified: Secondary | ICD-10-CM | POA: Diagnosis not present

## 2014-09-07 HISTORY — DX: Complete or unspecified spontaneous abortion without complication: O03.9

## 2014-09-07 LAB — COMPREHENSIVE METABOLIC PANEL
ALBUMIN: 3.3 g/dL — AB (ref 3.5–5.2)
ALK PHOS: 52 U/L (ref 39–117)
ALT: 11 U/L (ref 0–35)
AST: 16 U/L (ref 0–37)
Anion gap: 8 (ref 5–15)
BILIRUBIN TOTAL: 1.3 mg/dL — AB (ref 0.3–1.2)
BUN: <5 mg/dL — ABNORMAL LOW (ref 6–23)
CALCIUM: 8.6 mg/dL (ref 8.4–10.5)
CO2: 20 mmol/L (ref 19–32)
CREATININE: 0.48 mg/dL — AB (ref 0.50–1.10)
Chloride: 110 mmol/L (ref 96–112)
Glucose, Bld: 78 mg/dL (ref 70–99)
Potassium: 3.9 mmol/L (ref 3.5–5.1)
Sodium: 138 mmol/L (ref 135–145)
TOTAL PROTEIN: 6.4 g/dL (ref 6.0–8.3)

## 2014-09-07 LAB — URINALYSIS, ROUTINE W REFLEX MICROSCOPIC
Bilirubin Urine: NEGATIVE
GLUCOSE, UA: NEGATIVE mg/dL
HGB URINE DIPSTICK: NEGATIVE
Ketones, ur: NEGATIVE mg/dL
LEUKOCYTES UA: NEGATIVE
Nitrite: NEGATIVE
PROTEIN: NEGATIVE mg/dL
Specific Gravity, Urine: 1.014 (ref 1.005–1.030)
UROBILINOGEN UA: 1 mg/dL (ref 0.0–1.0)
pH: 6.5 (ref 5.0–8.0)

## 2014-09-07 LAB — CBC WITH DIFFERENTIAL/PLATELET
BASOS ABS: 0 10*3/uL (ref 0.0–0.1)
BASOS PCT: 0 % (ref 0–1)
EOS ABS: 0.4 10*3/uL (ref 0.0–0.7)
EOS PCT: 2 % (ref 0–5)
HCT: 25.2 % — ABNORMAL LOW (ref 36.0–46.0)
Hemoglobin: 8.7 g/dL — ABNORMAL LOW (ref 12.0–15.0)
LYMPHS PCT: 13 % (ref 12–46)
Lymphs Abs: 2 10*3/uL (ref 0.7–4.0)
MCH: 31.8 pg (ref 26.0–34.0)
MCHC: 34.5 g/dL (ref 30.0–36.0)
MCV: 92 fL (ref 78.0–100.0)
MONO ABS: 1 10*3/uL (ref 0.1–1.0)
Monocytes Relative: 6 % (ref 3–12)
NEUTROS ABS: 12.5 10*3/uL — AB (ref 1.7–7.7)
NEUTROS PCT: 79 % — AB (ref 43–77)
PLATELETS: 374 10*3/uL (ref 150–400)
RBC: 2.74 MIL/uL — ABNORMAL LOW (ref 3.87–5.11)
RDW: 16.4 % — ABNORMAL HIGH (ref 11.5–15.5)
WBC: 15.9 10*3/uL — ABNORMAL HIGH (ref 4.0–10.5)

## 2014-09-07 MED ORDER — HYDROMORPHONE HCL 2 MG/ML IJ SOLN
2.0000 mg | Freq: Once | INTRAMUSCULAR | Status: AC
  Administered 2014-09-07: 2 mg via INTRAVENOUS
  Filled 2014-09-07: qty 1

## 2014-09-07 MED ORDER — SODIUM CHLORIDE 0.9 % IV SOLN
1000.0000 mL | INTRAVENOUS | Status: DC
  Administered 2014-09-07 – 2014-09-08 (×2): 1000 mL via INTRAVENOUS

## 2014-09-07 MED ORDER — ONDANSETRON HCL 4 MG/2ML IJ SOLN
4.0000 mg | INTRAMUSCULAR | Status: DC | PRN
Start: 2014-09-07 — End: 2014-09-10
  Administered 2014-09-07 (×2): 4 mg via INTRAVENOUS
  Filled 2014-09-07 (×2): qty 2

## 2014-09-07 MED ORDER — SODIUM CHLORIDE 0.9 % IV SOLN
1000.0000 mL | Freq: Once | INTRAVENOUS | Status: AC
  Administered 2014-09-07: 1000 mL via INTRAVENOUS

## 2014-09-07 MED ORDER — DIPHENHYDRAMINE HCL 50 MG/ML IJ SOLN
12.5000 mg | INTRAMUSCULAR | Status: DC | PRN
  Administered 2014-09-07 – 2014-09-09 (×5): 12.5 mg via INTRAVENOUS
  Filled 2014-09-07 (×4): qty 1

## 2014-09-07 MED ORDER — HYDROMORPHONE HCL 2 MG/ML IJ SOLN
2.0000 mg | INTRAMUSCULAR | Status: AC | PRN
  Administered 2014-09-07 (×2): 2 mg via INTRAVENOUS
  Filled 2014-09-07 (×2): qty 1

## 2014-09-07 NOTE — ED Notes (Signed)
She c/o pain at upper torso/shoulders area which she recognizes as her sickle cell pain.  She further tells me she has vomited several times this morning and that she is 24 weeks/4 days gestation.  She is in no distress.

## 2014-09-07 NOTE — ED Notes (Signed)
When entering the phone, pt resting quietly, talking on the phone.

## 2014-09-07 NOTE — Progress Notes (Signed)
Spoke with Dr. Maylene RoesTamala Julian. Pt is a G3 PO with 2 due dates dates. One is Aug the 16th, which puts her at 56 5/7 weeks and the other one which is Aug 11 th which puts her at 33 3/7 weeks. She is here with sickle cell crisis. FHR is 150 BPM, no uc's. I have just received a call from [facility] for a pt who is 30 weeks and having vaginal bleeding. This pt is obstetrically stable and it is fine for the OBRR to sign off.

## 2014-09-07 NOTE — ED Notes (Signed)
Pt given some water to drink to give [place] a urine sample. Said she will try in about 30 mins.

## 2014-09-07 NOTE — ED Notes (Signed)
[facility] will be delayed x 1 hour

## 2014-09-07 NOTE — Progress Notes (Signed)
Pt  Is a G3 PO with 2 due dates. One is Aug 16th which will put her at 3 5/[redacted] weeks gestation and the other is Aug 11th, which puts her at 21 3/7 weeks. She says she gets her prenatal care at [facility] because of her sickle cell. Says she has had it since birth. She presents today with sickle cell pain that started around 0300 this morning. Says it is mostly in her back. No vaginal bleeding or leaking of fluid.

## 2014-09-07 NOTE — ED Provider Notes (Signed)
CSN: [id number]     Arrival date & time 09/07/14  1358 History   First MD Initiated Contact with Patient 09/07/14 1504     Chief Complaint  Patient presents with  . Sickle Cell Pain Crisis  . Emesis     (Consider location/radiation/quality/duration/timing/severity/associated sxs/prior Treatment) HPI Comments: Pt is [redacted] weeks pregnant--denies vag bleeding or contractions==compliant with meds  Patient is a 24 y.o. female presenting with sickle cell pain and vomiting. The history is provided by the patient.  Sickle Cell Pain Crisis Location:  Back Severity:  Severe Onset quality:  Gradual Duration:  2 days Similar to previous crisis episodes: yes   Timing:  Constant Progression:  Worsening Chronicity:  Recurrent Sickle cell genotype:  Unable to specify Date of last transfusion:  3 days ago History of pulmonary emboli: no   Context: pregnancy   Context: not non-compliance   Relieved by:  Nothing Worsened by:  Nothing tried Ineffective treatments:  Prescription drugs Associated symptoms: vomiting   Associated symptoms: no chest pain, no cough and no fever   Emesis   Past Medical History  Diagnosis Date  . H/O: 1 miscarriage 03/22/2011    Pt reports 2 miscarriages.  [name] 01/08/2009  . Depression 01/06/2011  . GERD (gastroesophageal reflux disease) 02/17/2011  . Trichotillomania     h/o  . Blood transfusion     "lots"  . Sickle cell anemia with crisis   . Exertional dyspnea     "sometimes"  . Sickle cell anemia   . Headache(784.0)   . Migraines 11/08/11    "@ least twice/month"  . Chronic back pain     "very severe; have knot in my back; from tight muscle; take RX and exercise for it"  . Mood swings 11/08/11    "I go back and forth; real bad"  . Pregnancy   . Blood dyscrasia     SICKLE CELL   Past Surgical History  Procedure Laterality Date  . Cholecystectomy  05/2010  . Dilation and curettage of uterus  02/20/11    S/P miscarriage   Family History   Problem Relation Age of Onset  . Alcoholism    . Depression    . Hypercholesterolemia    . Hypertension    . Migraines    . Diabetes Maternal Grandmother   . Diabetes Paternal Grandmother   . Diabetes Maternal Grandfather   . Sickle cell trait Mother   . Sickle cell trait Father    History  Substance Use Topics  . Smoking status: Former Smoker -- 0.25 packs/day for 1 years    Types: Cigarettes    Quit date: 03/25/2013  . Smokeless tobacco: Never Used  . Alcohol Use: No     Comment: pt states she quit marijuan in May 2013. Rare ETOH, + cigarettes.  She is enrolled in school   OB History    Gravida Para Term Preterm AB TAB SAB Ectopic Multiple Living   2    1  1          Obstetric Comments   Miscarried in October 2012 at about 7 weeks     Review of Systems  Constitutional: Negative for fever.  Respiratory: Negative for cough.   Cardiovascular: Negative for chest pain.  Gastrointestinal: Positive for vomiting.  All other systems reviewed and are negative.     Allergies  Carrot oil and Latex  Home Medications   Prior to Admission medications   Medication Sig Start Date End Date Taking? Authorizing  Provider  folic acid (FOLVITE) 1 MG tablet Take 2 tablets (2 mg total) by mouth daily. 04/24/14  Yes Dorena Dew, FNP  methadone (DOLOPHINE) 5 MG tablet Take 1 tablet (5 mg total) by mouth at bedtime. 08/26/14  Yes [name], MD  oxyCODONE (ROXICODONE) 15 MG immediate release tablet Take 1 tablet (15 mg total) by mouth every 4 (four) hours as needed for pain. 08/15/14  Yes [name], MD  Prenatal Vit-Fe Fumarate-FA (PRENATAL MULTIVITAMIN) TABS tablet Take 1 tablet by mouth daily at 12 noon.   Yes Historical Provider, MD  acetaminophen (TYLENOL) 500 MG tablet Take 1,000 mg by mouth every 6 (six) hours as needed for moderate pain or headache.    Historical Provider, MD   BP 99/63 mmHg  Pulse 93  Temp(Src) 98.5 F (36.9 C) (Oral)  Resp 16  SpO2 100%   LMP 03/20/2014 Physical Exam  Constitutional: She is oriented to person, place, and time. She appears well-developed and well-nourished.  Non-toxic appearance. No distress.  HENT:  Head: Normocephalic and atraumatic.  Eyes: Conjunctivae, EOM and lids are normal. Pupils are equal, round, and reactive to light.  Neck: Normal range of motion. Neck supple. No tracheal deviation present. No thyroid mass present.  Cardiovascular: Normal rate, regular rhythm and normal heart sounds.  Exam reveals no gallop.   No murmur heard. Pulmonary/Chest: Effort normal and breath sounds normal. No stridor. No respiratory distress. She has no decreased breath sounds. She has no wheezes. She has no rhonchi. She has no rales.  Abdominal: Soft. Normal appearance and bowel sounds are normal. She exhibits no distension. There is no tenderness. There is no rebound and no CVA tenderness.  Musculoskeletal: Normal range of motion. She exhibits no edema or tenderness.  Neurological: She is alert and oriented to person, place, and time. She has normal strength. No cranial nerve deficit or sensory deficit. GCS eye subscore is 4. GCS verbal subscore is 5. GCS motor subscore is 6.  Skin: Skin is warm and dry. No abrasion and no rash noted.  Psychiatric: She has a normal mood and affect. Her speech is normal and behavior is normal.  Nursing note and vitals reviewed.   ED Course  Procedures (including critical care time) Labs Review Labs Reviewed  URINE CULTURE  CBC WITH DIFFERENTIAL/PLATELET  COMPREHENSIVE METABOLIC PANEL  URINALYSIS, ROUTINE W REFLEX MICROSCOPIC    Imaging Review No results found.   EKG Interpretation None      MDM   Final diagnoses:  None     Patient given IV fluids and pain medications here 3 and continues to endorse discomfort. Spoke with dr Ihor Dow who has accepted the patient in transfer to [facility] hospital  Lacretia Leigh, MD 09/07/14 [phone]

## 2014-09-07 NOTE — ED Notes (Signed)
OB at bedside to assess fetal heart tones.

## 2014-09-08 ENCOUNTER — Encounter (HOSPITAL_COMMUNITY): Payer: Self-pay | Admitting: *Deleted

## 2014-09-08 DIAGNOSIS — D5700 Hb-SS disease with crisis, unspecified: Secondary | ICD-10-CM | POA: Diagnosis not present

## 2014-09-08 LAB — TYPE AND SCREEN
ABO/RH(D): B POS
ANTIBODY SCREEN: POSITIVE
DAT, IgG: NEGATIVE
DONOR AG TYPE: NEGATIVE
DONOR AG TYPE: NEGATIVE
Unit division: 0
Unit division: 0

## 2014-09-08 LAB — RETICULOCYTES
RBC.: 2.44 MIL/uL — ABNORMAL LOW (ref 3.87–5.11)
Retic Count, Absolute: 451.4 10*3/uL — ABNORMAL HIGH (ref 19.0–186.0)
Retic Ct Pct: 18.5 % — ABNORMAL HIGH (ref 0.4–3.1)

## 2014-09-08 LAB — CBC
HEMATOCRIT: 22.2 % — AB (ref 36.0–46.0)
HEMOGLOBIN: 7.7 g/dL — AB (ref 12.0–15.0)
MCH: 31.6 pg (ref 26.0–34.0)
MCHC: 34.7 g/dL (ref 30.0–36.0)
MCV: 91 fL (ref 78.0–100.0)
PLATELETS: 302 10*3/uL (ref 150–400)
RBC: 2.44 MIL/uL — AB (ref 3.87–5.11)
RDW: 16.9 % — ABNORMAL HIGH (ref 11.5–15.5)
WBC: 15 10*3/uL — AB (ref 4.0–10.5)

## 2014-09-08 LAB — MAGNESIUM: Magnesium: 1.8 mg/dL (ref 1.5–2.5)

## 2014-09-08 LAB — PREPARE RBC (CROSSMATCH)

## 2014-09-08 LAB — LACTATE DEHYDROGENASE: LDH: 204 U/L (ref 94–250)

## 2014-09-08 MED ORDER — DEXTROSE-NACL 5-0.45 % IV SOLN
INTRAVENOUS | Status: DC
  Administered 2014-09-08 – 2014-09-10 (×5): via INTRAVENOUS

## 2014-09-08 MED ORDER — CALCIUM CARBONATE ANTACID 500 MG PO CHEW: 2.0000 | CHEWABLE_TABLET | ORAL | Status: DC | PRN

## 2014-09-08 MED ORDER — ONDANSETRON HCL 4 MG/2ML IJ SOLN: 4.0000 mg | Freq: Four times a day (QID) | INTRAMUSCULAR | Status: DC | PRN

## 2014-09-08 MED ORDER — DIPHENHYDRAMINE HCL 25 MG PO CAPS
25.0000 mg | ORAL_CAPSULE | Freq: Once | ORAL | Status: AC
  Administered 2014-09-08: 25 mg via ORAL
  Filled 2014-09-08: qty 1

## 2014-09-08 MED ORDER — ENOXAPARIN SODIUM 40 MG/0.4ML ~~LOC~~ SOLN
40.0000 mg | SUBCUTANEOUS | Status: DC
  Administered 2014-09-08 – 2014-09-09 (×2): 40 mg via SUBCUTANEOUS
  Filled 2014-09-08 (×3): qty 0.4

## 2014-09-08 MED ORDER — NALOXONE HCL 0.4 MG/ML IJ SOLN: 0.4000 mg | INTRAMUSCULAR | Status: DC | PRN

## 2014-09-08 MED ORDER — ACETAMINOPHEN 325 MG PO TABS
650.0000 mg | ORAL_TABLET | Freq: Once | ORAL | Status: AC
  Administered 2014-09-08: 650 mg via ORAL
  Filled 2014-09-08: qty 2

## 2014-09-08 MED ORDER — SODIUM CHLORIDE 0.9 % IJ SOLN
9.0000 mL | INTRAMUSCULAR | Status: DC | PRN
Start: 2014-09-08 — End: 2014-09-09

## 2014-09-08 MED ORDER — DEXTROSE-NACL 5-0.45 % IV SOLN: INTRAVENOUS | Status: AC

## 2014-09-08 MED ORDER — ZOLPIDEM TARTRATE 5 MG PO TABS: 5.0000 mg | ORAL_TABLET | Freq: Every evening | ORAL | Status: DC | PRN

## 2014-09-08 MED ORDER — HYDROMORPHONE HCL 2 MG/ML IJ SOLN
2.0000 mg | INTRAMUSCULAR | Status: DC
  Administered 2014-09-08 – 2014-09-09 (×5): 2 mg via INTRAVENOUS
  Filled 2014-09-08 (×5): qty 1

## 2014-09-08 MED ORDER — ACETAMINOPHEN 325 MG PO TABS: 650.0000 mg | ORAL_TABLET | ORAL | Status: DC | PRN

## 2014-09-08 MED ORDER — DEXTROSE-NACL 5-0.45 % IV SOLN
INTRAVENOUS | Status: AC
  Administered 2014-09-08: 15:00:00 via INTRAVENOUS

## 2014-09-08 MED ORDER — OXYCODONE HCL 5 MG PO TABS
15.0000 mg | ORAL_TABLET | ORAL | Status: DC | PRN
  Administered 2014-09-09: 15 mg via ORAL
  Filled 2014-09-08: qty 3

## 2014-09-08 MED ORDER — HYDROMORPHONE 0.3 MG/ML IV SOLN
INTRAVENOUS | Status: DC
  Administered 2014-09-08 (×2): via INTRAVENOUS
  Administered 2014-09-08: 7 mg via INTRAVENOUS
  Administered 2014-09-08: 6 mg via INTRAVENOUS
  Administered 2014-09-09: 6.3 mg via INTRAVENOUS
  Administered 2014-09-09: 08:00:00 via INTRAVENOUS
  Filled 2014-09-08 (×3): qty 25

## 2014-09-08 MED ORDER — DIPHENHYDRAMINE HCL 50 MG/ML IJ SOLN
12.5000 mg | Freq: Four times a day (QID) | INTRAMUSCULAR | Status: DC | PRN
  Filled 2014-09-08: qty 1

## 2014-09-08 MED ORDER — OXYCODONE HCL 15 MG PO TABS: 15.0000 mg | ORAL_TABLET | ORAL | Status: DC | PRN

## 2014-09-08 MED ORDER — SODIUM CHLORIDE 0.9 % IJ SOLN
10.0000 mL | Freq: Two times a day (BID) | INTRAMUSCULAR | Status: DC
  Administered 2014-09-08 – 2014-09-10 (×5): 10 mL

## 2014-09-08 MED ORDER — METHADONE HCL 10 MG PO TABS
5.0000 mg | ORAL_TABLET | Freq: Every day | ORAL | Status: DC
  Administered 2014-09-08 – 2014-09-09 (×3): 5 mg via ORAL
  Filled 2014-09-08 (×3): qty 1

## 2014-09-08 MED ORDER — PRENATAL MULTIVITAMIN CH
1.0000 | ORAL_TABLET | Freq: Every day | ORAL | Status: DC
  Filled 2014-09-08: qty 1

## 2014-09-08 MED ORDER — SODIUM CHLORIDE 0.9 % IJ SOLN
10.0000 mL | INTRAMUSCULAR | Status: DC | PRN
  Administered 2014-09-08: 10 mL
  Filled 2014-09-08 (×3): qty 40

## 2014-09-08 MED ORDER — DIPHENHYDRAMINE HCL 12.5 MG/5ML PO ELIX
12.5000 mg | ORAL_SOLUTION | Freq: Four times a day (QID) | ORAL | Status: DC | PRN
  Filled 2014-09-08: qty 5

## 2014-09-08 MED ORDER — HYDROMORPHONE HCL 1 MG/ML IJ SOLN
1.0000 mg | INTRAMUSCULAR | Status: DC | PRN
  Administered 2014-09-08 (×4): 1 mg via INTRAVENOUS
  Filled 2014-09-08 (×4): qty 1

## 2014-09-08 MED ORDER — COMPLETENATE 29-1 MG PO CHEW
1.0000 | CHEWABLE_TABLET | Freq: Every day | ORAL | Status: DC
  Administered 2014-09-08 – 2014-09-10 (×3): 1 via ORAL
  Filled 2014-09-08 (×5): qty 1

## 2014-09-08 MED ORDER — SENNOSIDES-DOCUSATE SODIUM 8.6-50 MG PO TABS
1.0000 | ORAL_TABLET | Freq: Two times a day (BID) | ORAL | Status: DC
  Administered 2014-09-08 – 2014-09-09 (×4): 1 via ORAL
  Filled 2014-09-08 (×6): qty 1

## 2014-09-08 MED ORDER — POLYETHYLENE GLYCOL 3350 17 G PO PACK: 17.0000 g | PACK | Freq: Every day | ORAL | Status: DC | PRN

## 2014-09-08 MED ORDER — HYDROMORPHONE HCL 2 MG/ML IJ SOLN
2.0000 mg | INTRAMUSCULAR | Status: DC | PRN
  Administered 2014-09-08 – 2014-09-10 (×7): 2 mg via INTRAVENOUS
  Filled 2014-09-08 (×9): qty 1

## 2014-09-08 MED ORDER — FOLIC ACID 1 MG PO TABS
1.0000 mg | ORAL_TABLET | Freq: Every day | ORAL | Status: DC
  Administered 2014-09-08 – 2014-09-10 (×3): 1 mg via ORAL
  Filled 2014-09-08 (×4): qty 1

## 2014-09-08 MED ORDER — SODIUM CHLORIDE 0.9 % IV SOLN: Freq: Once | INTRAVENOUS | Status: DC

## 2014-09-08 NOTE — Progress Notes (Addendum)
Faculty Practice OB/GYN Attending Note  24 y.o. G2P0010 at [redacted]w[redacted]d admitted for sickle cell crisis.  We are unable to administer high dose Morphine PCA at this hopsital currently, so Sickle Cell Service was consulted for their input about pain management. Discussed patient with Dr. Alben Deeds who recommended ordering the regular full-dose Dilaudid PCA, Dilaudid 2 mg IV q4h and 2 mg IV q4h prn breakthrough.  Formal consultation will be done later.  Orders placed for patient, will follow up recommendations.    Will continue close observation and routine antenatal care.  Verita Schneiders, MD, [place] Attending [place], [facility]

## 2014-09-08 NOTE — Progress Notes (Signed)
Patient does not appear to be in any pain or distress. Sleeping on and off between questions while doing admission assesement.

## 2014-09-08 NOTE — H&P (Signed)
FACULTY PRACTICE ANTEPARTUM ADMISSION HISTORY AND PHYSICAL NOTE  CC: patient reports that she came in due to [facility] cell crises.  She was initally seen at [facility].  She reports that her sx began the day prior to presenting to the ED and she came in to avoid sx becoming severe.  She denies pregnancy issues.  She received an exchange transfusion 09/04/2014.  She reports that she has pain in her back and over multiple joints.  She reports that this is the usual location of her pain crises.  She reports that since admission she has been taking Dilaudid q 2 hours.  She feel like it is helpful but, not completely.   History of Present Illness: [name] is a 24 y.o. G2P0010 at [redacted]w[redacted]d admitted for sickle cell crises.   Patient reports the fetal movement as active. Patient reports uterine contraction  activity as none. Patient reports  vaginal bleeding as none. Patient describes fluid per vagina as None. Fetal presentation is unsure.  Patient Active Problem List   Diagnosis Date Noted  . Sickle cell crisis 09/07/2014  . 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 Supervision of high-risk pregnancy 06/11/2014  . Chronic pain 02/26/2013  . Anemia of chronic disease 10/05/2012  . 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 Mood swings 11/08/2011  . Migraines 11/08/2011  . Overweight(278.02) 05/24/2011  . GERD (gastroesophageal reflux disease) 02/17/2011  . Depression 01/06/2011  . Back pain 09/17/2010  . Sickle cell disease 01/08/2009  . 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 H/O: 1 miscarriage 03/22/2011    Pt reports 2 miscarriages.  [name] 01/08/2009  . Depression 01/06/2011  . GERD (gastroesophageal reflux disease) 02/17/2011  . Trichotillomania     h/o  . Blood transfusion     "lots"  . Sickle cell anemia with crisis   . Exertional dyspnea     "sometimes"  . Sickle cell anemia   . Headache(784.0)   . Migraines 11/08/11    "@ least  twice/month"  . Chronic back pain     "very severe; have knot in my back; from tight muscle; take RX and exercise for it"  . Mood swings 11/08/11    "I go back and forth; real bad"  . Pregnancy   . Blood dyscrasia     SICKLE CELL    Past Surgical History  Procedure Laterality Date  . Cholecystectomy  05/2010  . Dilation and curettage of uterus  02/20/11    S/P miscarriage    OB History  Gravida Para Term Preterm AB SAB TAB Ectopic Multiple Living  2    1 1         # Outcome Date GA Lbr Len/2nd Weight Sex Delivery Anes PTL Lv  2 Current           1 SAB 02/20/11             Comments: System Generated. Please review and update pregnancy details.    Obstetric Comments  Miscarried in October 2012 at about 7 weeks    History   Social History  . Marital Status: Single    Spouse Name: N/A  . Number of Children: N/A  . Years of Education: N/A   Social History Main Topics  . Smoking status: Former Smoker -- 0.25 packs/day for 1 years    Types: Cigarettes    Quit date: 03/25/2013  . Smokeless tobacco: Never Used  . Alcohol Use: No  Comment: pt states she quit marijuan in May 2013. Rare ETOH, + cigarettes.  She is enrolled in school  . Drug Use: No  . Sexual Activity: Not Currently    Birth Control/ Protection: None   Other Topics Concern  . 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 Alcoholism    . Depression    . Hypercholesterolemia    . Hypertension    . Migraines    . Diabetes Maternal Grandmother   . Diabetes Paternal Grandmother   . Diabetes Maternal Grandfather   . Sickle cell trait Mother   . Sickle cell trait Father     Allergies  Allergen Reactions  . Carrot Oil Hives and Swelling  . Latex Rash    Prescriptions prior to admission  Medication Sig Dispense Refill Last Dose  . folic acid (FOLVITE)  1 MG tablet Take 2 tablets (2 mg total) by mouth daily. 60 tablet 2 09/06/2014 at Unknown time  . methadone (DOLOPHINE) 5 MG tablet Take 1 tablet (5 mg total) by mouth at bedtime. 30 tablet 0 09/07/2014 at Unknown time  . oxyCODONE (ROXICODONE) 15 MG immediate release tablet Take 1 tablet (15 mg total) by mouth every 4 (four) hours as needed for pain. 90 tablet 0 09/07/2014 at Unknown time  . Prenatal Vit-Fe Fumarate-FA (PRENATAL MULTIVITAMIN) TABS tablet Take 1 tablet by mouth daily at 12 noon.   09/06/2014 at Unknown time  . acetaminophen (TYLENOL) 500 MG tablet Take 1,000 mg by mouth every 6 (six) hours as needed for moderate pain or headache.   unknown at unknown time    Review of Systems - Respiratory ROS: no cough, shortness of breath, or wheezing Gastrointestinal ROS: no abdominal pain, change in bowel habits, or black or bloody stools she is voiding and passing stools normally.  Vitals:  BP 107/66 mmHg  Pulse 91  Temp(Src) 98.4 F (36.9 C) (Oral)  Resp 18  Ht 5\' 2"  (1.575 m)  Wt 163 lb (73.936 kg)  BMI 29.81 kg/m2  SpO2 99%  LMP 03/20/2014 Physical Examination: GENERAL: Well-developed, well-nourished female in no acute distress.  LUNGS: Clear to auscultation bilaterally.  HEART: Regular rate and rhythm. ABDOMEN: Soft, nontender, nondistended. No organomegaly. gravid EXTREMITIES: No cyanosis, clubbing, or edema CERVIX: Not evaluated.   Membranes:intact Fetal Monitoring:Baseline: 140's bpm, Variability: Good {> 6 bpm) and Accelerations: Non-reactive but appropriate for gestational age Tocometer: Flat  Labs:  Results for orders placed or performed during the hospital encounter of 09/07/14 (from the past 24 hour(s))  CBC WITH DIFFERENTIAL   Collection Time: 09/07/14  3:39 PM  Result Value Ref Range   WBC 15.9 (H) 4.0 - 10.5 K/uL   RBC 2.74 (L) 3.87 - 5.11 MIL/uL   Hemoglobin 8.7 (L) 12.0 - 15.0 g/dL   HCT 25.2 (L) 36.0 - 46.0 %   MCV 92.0 78.0 - 100.0 fL   MCH 31.8 26.0 -  34.0 pg   MCHC 34.5 30.0 - 36.0 g/dL   RDW 16.4 (H) 11.5 - 15.5 %   Platelets 374 150 - 400 K/uL   Neutrophils Relative % 79 (H) 43 - 77 %   Neutro Abs 12.5 (H) 1.7 - 7.7 K/uL   Lymphocytes Relative 13 12 - 46 %   Lymphs Abs 2.0 0.7 - 4.0 K/uL  Monocytes Relative 6 3 - 12 %   Monocytes Absolute 1.0 0.1 - 1.0 K/uL   Eosinophils Relative 2 0 - 5 %   Eosinophils Absolute 0.4 0.0 - 0.7 K/uL   Basophils Relative 0 0 - 1 %   Basophils Absolute 0.0 0.0 - 0.1 K/uL  Comprehensive metabolic panel   Collection Time: 09/07/14  3:39 PM  Result Value Ref Range   Sodium 138 135 - 145 mmol/L   Potassium 3.9 3.5 - 5.1 mmol/L   Chloride 110 96 - 112 mmol/L   CO2 20 19 - 32 mmol/L   Glucose, Bld 78 70 - 99 mg/dL   BUN <5 (L) 6 - 23 mg/dL   Creatinine, Ser 0.48 (L) 0.50 - 1.10 mg/dL   Calcium 8.6 8.4 - 10.5 mg/dL   Total Protein 6.4 6.0 - 8.3 g/dL   Albumin 3.3 (L) 3.5 - 5.2 g/dL   AST 16 0 - 37 U/L   ALT 11 0 - 35 U/L   Alkaline Phosphatase 52 39 - 117 U/L   Total Bilirubin 1.3 (H) 0.3 - 1.2 mg/dL   GFR calc non Af Amer >90 >90 mL/min   GFR calc Af Amer >90 >90 mL/min   Anion gap 8 5 - 15  Urinalysis, Routine w reflex microscopic   Collection Time: 09/07/14  5:22 PM  Result Value Ref Range   Color, Urine YELLOW YELLOW   APPearance CLEAR CLEAR   Specific Gravity, Urine 1.014 1.005 - 1.030   pH 6.5 5.0 - 8.0   Glucose, UA NEGATIVE NEGATIVE mg/dL   Hgb urine dipstick NEGATIVE NEGATIVE   Bilirubin Urine NEGATIVE NEGATIVE   Ketones, ur NEGATIVE NEGATIVE mg/dL   Protein, ur NEGATIVE NEGATIVE mg/dL   Urobilinogen, UA 1.0 0.0 - 1.0 mg/dL   Nitrite NEGATIVE NEGATIVE   Leukocytes, UA NEGATIVE NEGATIVE    Imaging Studies: Dg Chest 2 View  08/19/2014   CLINICAL DATA:  Sickle cell crisis. Body aches and slight cough. Patient is pregnant with abdominal shielding.  EXAM: CHEST  2 VIEW  COMPARISON:  08/09/2014  FINDINGS: Normal heart size and pulmonary vascularity. No focal airspace disease or  consolidation in the lungs. No blunting of costophrenic angles. No pneumothorax. Mediastinal contours appear intact. Bone changes consistent with sickle cell.  IMPRESSION: No active cardiopulmonary disease.   Electronically Signed   By: Lucienne Capers M.D.   On: 08/19/2014 05:37   Dg Chest 2 View  08/09/2014   CLINICAL DATA:  Fever, [redacted] weeks pregnant  EXAM: CHEST  2 VIEW  COMPARISON:  04/15/2014  FINDINGS: The heart size and mediastinal contours are within normal limits. Both lungs are clear with the exception of streaky linear upper lobe scarring reidentified. 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 Central vertebral body compression deformities are also compatible with sickle cell disease. Cholecystectomy clips are noted.  IMPRESSION: No active cardiopulmonary disease.   Electronically Signed   By: Conchita Paris M.D.   On: 08/09/2014 16:38   Ir Fluoro Guide Cv Line Left  09/04/2014   CLINICAL DATA:  Sickle cell disease, pregnant, needs IV access  EXAM: PICC PLACEMENT WITH ULTRASOUND AND FLUOROSCOPY  FLUOROSCOPY TIME:  0.1 minutes, 0.4 mGy  TECHNIQUE: After written informed consent was obtained, patient was placed in the supine position on angiographic table. Patency of the left brachial vein was confirmed with ultrasound with image documentation. An appropriate skin site was determined. Skin site  was marked. Region was prepped using maximum barrier technique including cap and mask, sterile gown, sterile gloves, large sterile sheet, and Chlorhexidine as cutaneous antisepsis. The region was infiltrated locally with 1% lidocaine. Under real-time ultrasound guidance, the left brachial vein was accessed with a 21 gauge micropuncture needle; the needle tip within the vein was confirmed with ultrasound image documentation. Needle exchanged over a 018 guidewire for a peel-away sheath, through which a 5-French double-lumen power  injectable PICC trimmed to 38cm was advanced, positioned with its tip near the cavoatrial junction. Spot chest radiograph confirms appropriate catheter position. Catheter was flushed per protocol and secured externally. The patient tolerated procedure well.  COMPLICATIONS: COMPLICATIONS none  IMPRESSION: 1. Technically successful five [place] double lumen power injectable PICC placement   Electronically Signed   By: Lucrezia Europe M.D.   On: 09/04/2014 12:42   Ir US Guide Vasc Access Left  09/04/2014   CLINICAL DATA:  Sickle cell disease, pregnant, needs IV access  EXAM: PICC PLACEMENT WITH ULTRASOUND AND FLUOROSCOPY  FLUOROSCOPY TIME:  0.1 minutes, 0.4 mGy  TECHNIQUE: After written informed consent was obtained, patient was placed in the supine position on angiographic table. Patency of the left brachial vein was confirmed with ultrasound with image documentation. An appropriate skin site was determined. Skin site was marked. Region was prepped using maximum barrier technique including cap and mask, sterile gown, sterile gloves, large sterile sheet, and Chlorhexidine as cutaneous antisepsis. The region was infiltrated locally with 1% lidocaine. Under real-time ultrasound guidance, the left brachial vein was accessed with a 21 gauge micropuncture needle; the needle tip within the vein was confirmed with ultrasound image documentation. Needle exchanged over a 018 guidewire for a peel-away sheath, through which a 5-French double-lumen power injectable PICC trimmed to 38cm was advanced, positioned with its tip near the cavoatrial junction. Spot chest radiograph confirms appropriate catheter position. Catheter was flushed per protocol and secured externally. The patient tolerated procedure well.  COMPLICATIONS: COMPLICATIONS none  IMPRESSION: 1. Technically successful five [place] double lumen power injectable PICC placement   Electronically Signed   By: Lucrezia Europe M.D.   On: 09/04/2014 12:42     Prenatal Transfer Tool   Maternal Diabetes: No Genetic Screening: Declined Maternal Ultrasounds/Referrals: Normal Fetal Ultrasounds or other Referrals:  None Maternal Substance Abuse:  No Significant Maternal Medications:  Meds include: Other: Methadone, Oxyxcodone and Folic acid   Significant Maternal Lab Results: Lab values include: Other:    Assessment and Plan: Patient Active Problem List   Diagnosis Date Noted  . Sickle cell crisis 09/07/2014  . 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 Supervision of high-risk pregnancy 06/11/2014  . Chronic pain 02/26/2013  . Anemia of chronic disease 10/05/2012  . 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 Mood swings 11/08/2011  . Migraines 11/08/2011  . Overweight(278.02) 05/24/2011  . GERD (gastroesophageal reflux disease) 02/17/2011  . Depression 01/06/2011  . Back pain 09/17/2010  . Sickle cell disease 01/08/2009  . 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 [name], M.D., [facility]

## 2014-09-08 NOTE — Progress Notes (Signed)
Ur chart review completed.  

## 2014-09-08 NOTE — Progress Notes (Signed)
Pharmacist-Physician Communication  Concern: A possible Drug-Drug Interaction  Description: The addition of ondansetron to a patient currently on methadone can cause a significant prolongation of the QT interval      Recommendation(s): Monitor the QT interval and/or consider change in medication for nausea/vomiting     Thank You,

## 2014-09-08 NOTE — ED Notes (Signed)
Pt resting quietly with eyes closed when entering the room.

## 2014-09-08 NOTE — Telephone Encounter (Signed)
Prescription written for Oxycodone 15 mg #90 tabs. NCCSRS reviewed and no inconsistencies noted. Pt is currently pregnant and we have discussed weaning her Opiates after her pregnancy as weaning during pregnancy has risk of  opiate withdrawal syndrome fort the fetus.

## 2014-09-08 NOTE — Consult Note (Signed)
SICKLE CELL SERVICE CONSULT NOTE  [name] PNT:[id number] DOB: 04/06/1991 DOA: 09/07/2014 PCP: [name]., MD  Consulting Service: OB/Gyn Consulting Physician: Dr. Verita Schneiders  Presenting HPI: [name] is a 24 y.o. G3P0010 at [redacted] weeks gestation who presented with right sided pain, consistent with her sickle cell crisis. Pt states that her pain started in the middle of the night 2 days ago. She woke up in pain and had NBNB emesis shortly after. She tried to manage pain at home with her home meds but it did not help. She then went to [facility] ED where she received initial management with IV fluid and bolus IV Dilaudid. Pain improved slightly but was still severe. She was then admitted to Ob/gyn at the [facility] for further pain management in her pregnant state. Currently, the patient rates her pain as a 7/10 and improved from initial presentation. She is not feeling nauseated or had any emesis. She is on a regular dose PCA and is finding some relief of her pain. She is eating and drinking well. Her last bowel movement was 2 days ago. Denies SOB, n/v/d/c, CP, vision changes, dizziness.   Objective: Filed Vitals:   09/08/14 1208 09/08/14 1209 09/08/14 1213 09/08/14 1218  BP: 93/58     Pulse: 87 88 99 86  Temp:      TempSrc:      Resp:      Height:      Weight:      SpO2:  94% 95% 94%   Weight change:  No intake or output data in the 24 hours ending 09/08/14 1246  General: Alert, awake, oriented x3, in no acute distress.  HEENT: [employer]/AT PEERL, EOMI Neck: Trachea midline,  no masses OROPHARYNX:  Moist, No exudate/ erythema/lesions.  Heart: Regular rate and rhythm, without murmurs, rubs, gallops Lungs: Clear to auscultation, no wheezing or rhonchi noted. Abdomen: Soft, nontender, nondistended, positive bowel sounds, gravid Neuro: No focal neurological deficits noted cranial nerves II through XII grossly intact. Strength 5 out of 5 in bilateral upper and lower  extremities. Musculoskeletal: No warmth, swelling, or erythema around joints, no spinal tenderness noted. Lymph node survey: No cervical lymphadenopathy noted.   Data Reviewed: Basic Metabolic Panel:  Recent Labs Lab 09/07/14 1539 09/08/14 0550  NA 138  --   K 3.9  --   CL 110  --   CO2 20  --   GLUCOSE 78  --   BUN <5*  --   CREATININE 0.48*  --   CALCIUM 8.6  --   MG  --  1.8   Liver Function Tests:  Recent Labs Lab 09/07/14 1539  AST 16  ALT 11  ALKPHOS 52  BILITOT 1.3*  PROT 6.4  ALBUMIN 3.3*   No results for input(s): LIPASE, AMYLASE in the last 168 hours. No results for input(s): AMMONIA in the last 168 hours. CBC:  Recent Labs Lab 09/07/14 1539 09/08/14 0550  WBC 15.9* 15.0*  NEUTROABS 12.5*  --   HGB 8.7* 7.7*  HCT 25.2* 22.2*  MCV 92.0 91.0  PLT 374 302   Cardiac Enzymes: No results for input(s): CKTOTAL, CKMB, CKMBINDEX, TROPONINI in the last 168 hours. BNP (last 3 results) No results for input(s): PROBNP in the last 8760 hours. CBG: No results for input(s): GLUCAP in the last 168 hours.  No results found for this or any previous visit (from the past 240 hour(s)).   Studies: Dg Chest 2 View  08/19/2014  CLINICAL DATA:  Sickle cell crisis. Body aches and slight cough. Patient is pregnant with abdominal shielding.  EXAM: CHEST  2 VIEW  COMPARISON:  08/09/2014  FINDINGS: Normal heart size and pulmonary vascularity. No focal airspace disease or consolidation in the lungs. No blunting of costophrenic angles. No pneumothorax. Mediastinal contours appear intact. Bone changes consistent with sickle cell.  IMPRESSION: No active cardiopulmonary disease.   Electronically Signed   By: Lucienne Capers M.D.   On: 08/19/2014 05:37   Dg Chest 2 View  08/09/2014   CLINICAL DATA:  Fever, [redacted] weeks pregnant  EXAM: CHEST  2 VIEW  COMPARISON:  04/15/2014  FINDINGS: The heart size and mediastinal contours are within normal limits. Both lungs are clear with the  exception of streaky linear upper lobe scarring reidentified. 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 Central vertebral body compression deformities are also compatible with sickle cell disease. Cholecystectomy clips are noted.  IMPRESSION: No active cardiopulmonary disease.   Electronically Signed   By: Conchita Paris M.D.   On: 08/09/2014 16:38   Ir Fluoro Guide Cv Line Left  09/04/2014   CLINICAL DATA:  Sickle cell disease, pregnant, needs IV access  EXAM: PICC PLACEMENT WITH ULTRASOUND AND FLUOROSCOPY  FLUOROSCOPY TIME:  0.1 minutes, 0.4 mGy  TECHNIQUE: After written informed consent was obtained, patient was placed in the supine position on angiographic table. Patency of the left brachial vein was confirmed with ultrasound with image documentation. An appropriate skin site was determined. Skin site was marked. Region was prepped using maximum barrier technique including cap and mask, sterile gown, sterile gloves, large sterile sheet, and Chlorhexidine as cutaneous antisepsis. The region was infiltrated locally with 1% lidocaine. Under real-time ultrasound guidance, the left brachial vein was accessed with a 21 gauge micropuncture needle; the needle tip within the vein was confirmed with ultrasound image documentation. Needle exchanged over a 018 guidewire for a peel-away sheath, through which a 5-French double-lumen power injectable PICC trimmed to 38cm was advanced, positioned with its tip near the cavoatrial junction. Spot chest radiograph confirms appropriate catheter position. Catheter was flushed per protocol and secured externally. The patient tolerated procedure well.  COMPLICATIONS: COMPLICATIONS none  IMPRESSION: 1. Technically successful five [place] double lumen power injectable PICC placement   Electronically Signed   By: Lucrezia Europe M.D.   On: 09/04/2014 12:42   Ir US Guide Vasc Access Left  09/04/2014    CLINICAL DATA:  Sickle cell disease, pregnant, needs IV access  EXAM: PICC PLACEMENT WITH ULTRASOUND AND FLUOROSCOPY  FLUOROSCOPY TIME:  0.1 minutes, 0.4 mGy  TECHNIQUE: After written informed consent was obtained, patient was placed in the supine position on angiographic table. Patency of the left brachial vein was confirmed with ultrasound with image documentation. An appropriate skin site was determined. Skin site was marked. Region was prepped using maximum barrier technique including cap and mask, sterile gown, sterile gloves, large sterile sheet, and Chlorhexidine as cutaneous antisepsis. The region was infiltrated locally with 1% lidocaine. Under real-time ultrasound guidance, the left brachial vein was accessed with a 21 gauge micropuncture needle; the needle tip within the vein was confirmed with ultrasound image documentation. Needle exchanged over a 018 guidewire for a peel-away sheath, through which a 5-French double-lumen power injectable PICC trimmed to 38cm was advanced, positioned with its tip near the cavoatrial junction. Spot chest radiograph confirms appropriate catheter position. Catheter was flushed per protocol and secured externally.  The patient tolerated procedure well.  COMPLICATIONS: COMPLICATIONS none  IMPRESSION: 1. Technically successful five [place] double lumen power injectable PICC placement   Electronically Signed   By: Lucrezia Europe M.D.   On: 09/04/2014 12:42    Scheduled Meds: . folic acid  1 mg Oral Daily  .  HYDROmorphone (DILAUDID) injection  2 mg Intravenous Q4H  . HYDROmorphone PCA 0.3 mg/mL   Intravenous 6 times per day  . methadone  5 mg Oral QHS  . prenatal multivitamin  1 tablet Oral Q1200  . senna-docusate  1 tablet Oral BID   Continuous Infusions: . sodium chloride 1,000 mL (09/08/14 1105)    Principal Problem:   Sickle cell crisis   Assessment/Plan: [name] is a 24yo with sickle-cell disease who is currently [redacted] weeks pregnant who initially  presented to the ER with symptoms consistent with vaso-occlusive crisis. She is currently admitted to Ob/gyn for further pain management and monitoring of her pregnancy.  Recommendations: 1. Sickle Cell Crisis: Continue her full dose Dilaudid PCA at 0.3mg  q 8 min with a lockout at 1.25mg  per hour. Continue home Methadone 5mg  QHS for long acting control. Start Dilaudid 2mg  IV q4h scheduled and Dilaudid 2mg  IV q4h PRN for breakthrough. We will continue to follow and assess pain management needs.Switch IV fluids from NS to D5/0.45% at 158ml/hr.  2. Constipation: Continue bowel regimen as constipation is an adverse effect due to opioid use. Give Miralax if she doesn't have a bowel movement in the next 1-2 days.  3. Sickle Cell Care: Continue Folic acid 4. Anemia: Hgb down-trended to 7.7 from 8.7. Please transfuse 1 unit PRBC, which should also help with acute crisis. Goal is to keep hgb>8.0 due to her pregnancy. Recheck CBC w/diff in the morning.   Thank you for this consult. Please feel free to call us with any questions or concerns.   [place], [place]

## 2014-09-09 ENCOUNTER — Encounter (HOSPITAL_COMMUNITY): Payer: Self-pay

## 2014-09-09 DIAGNOSIS — N764 Abscess of vulva: Secondary | ICD-10-CM | POA: Diagnosis present

## 2014-09-09 LAB — COMPREHENSIVE METABOLIC PANEL
ALBUMIN: 3.1 g/dL — AB (ref 3.5–5.2)
ALK PHOS: 61 U/L (ref 39–117)
ALT: 14 U/L (ref 0–35)
ANION GAP: 5 (ref 5–15)
AST: 21 U/L (ref 0–37)
BILIRUBIN TOTAL: 1.7 mg/dL — AB (ref 0.3–1.2)
BUN: 6 mg/dL (ref 6–23)
CALCIUM: 8.4 mg/dL (ref 8.4–10.5)
CO2: 23 mmol/L (ref 19–32)
Chloride: 108 mmol/L (ref 96–112)
Creatinine, Ser: 0.46 mg/dL — ABNORMAL LOW (ref 0.50–1.10)
GLUCOSE: 98 mg/dL (ref 70–99)
POTASSIUM: 3.8 mmol/L (ref 3.5–5.1)
Sodium: 136 mmol/L (ref 135–145)
TOTAL PROTEIN: 5.9 g/dL — AB (ref 6.0–8.3)

## 2014-09-09 LAB — URINE CULTURE

## 2014-09-09 LAB — CBC WITH DIFFERENTIAL/PLATELET
BASOS ABS: 0 10*3/uL (ref 0.0–0.1)
Basophils Relative: 0 % (ref 0–1)
EOS PCT: 3 % (ref 0–5)
Eosinophils Absolute: 0.4 10*3/uL (ref 0.0–0.7)
HEMATOCRIT: 25.1 % — AB (ref 36.0–46.0)
Hemoglobin: 8.6 g/dL — ABNORMAL LOW (ref 12.0–15.0)
LYMPHS PCT: 13 % (ref 12–46)
Lymphs Abs: 2.1 10*3/uL (ref 0.7–4.0)
MCH: 30.3 pg (ref 26.0–34.0)
MCHC: 34.3 g/dL (ref 30.0–36.0)
MCV: 88.4 fL (ref 78.0–100.0)
Monocytes Absolute: 1.3 10*3/uL — ABNORMAL HIGH (ref 0.1–1.0)
Monocytes Relative: 8 % (ref 3–12)
Neutro Abs: 12.1 10*3/uL — ABNORMAL HIGH (ref 1.7–7.7)
Neutrophils Relative %: 76 % (ref 43–77)
Platelets: 293 10*3/uL (ref 150–400)
RBC: 2.84 MIL/uL — ABNORMAL LOW (ref 3.87–5.11)
RDW: 17.8 % — ABNORMAL HIGH (ref 11.5–15.5)
WBC: 15.9 10*3/uL — AB (ref 4.0–10.5)

## 2014-09-09 MED ORDER — HYDROMORPHONE HCL 2 MG/ML IJ SOLN
2.0000 mg | Freq: Once | INTRAMUSCULAR | Status: AC
  Administered 2014-09-09: 2 mg via INTRAVENOUS

## 2014-09-09 MED ORDER — NALOXONE HCL 0.4 MG/ML IJ SOLN: 0.4000 mg | INTRAMUSCULAR | Status: DC | PRN

## 2014-09-09 MED ORDER — DIPHENHYDRAMINE HCL 25 MG PO CAPS
25.0000 mg | ORAL_CAPSULE | Freq: Four times a day (QID) | ORAL | Status: DC | PRN
  Administered 2014-09-10: 25 mg via ORAL
  Filled 2014-09-09: qty 1

## 2014-09-09 MED ORDER — SODIUM CHLORIDE 0.9 % IV SOLN
25.0000 mg | INTRAVENOUS | Status: DC | PRN
Start: 2014-09-09 — End: 2014-09-10
  Administered 2014-09-09 (×2): 25 mg via INTRAVENOUS
  Filled 2014-09-09 (×5): qty 0.5

## 2014-09-09 MED ORDER — SODIUM CHLORIDE 0.9 % IJ SOLN
9.0000 mL | INTRAMUSCULAR | Status: DC | PRN
  Filled 2014-09-09: qty 9

## 2014-09-09 MED ORDER — HYDROMORPHONE 0.3 MG/ML IV SOLN
INTRAVENOUS | Status: DC
  Administered 2014-09-09: 2.8 mg via INTRAVENOUS
  Administered 2014-09-09: 1.91 mg via INTRAVENOUS
  Administered 2014-09-09: 16:00:00 via INTRAVENOUS
  Administered 2014-09-09: 3.9 mg via INTRAVENOUS
  Administered 2014-09-10: 3.4 mg via INTRAVENOUS
  Administered 2014-09-10: 1.19 mg via INTRAVENOUS
  Administered 2014-09-10: 4.22 mg via INTRAVENOUS
  Administered 2014-09-10: 3.49 mg via INTRAVENOUS
  Filled 2014-09-09 (×2): qty 25

## 2014-09-09 NOTE — Progress Notes (Signed)
Patient seen at request of nursing due to small indurated area on left labia. Started a few days ago.  Area bothers her and is tender.  Squeezed one of the areas and a small amount of purulent discharge came out.    Exam reveals two small indurated area on left labia measuring about 2x1cm in size.  No fluctuance.  Minimal erythema surrounding.  A/P 1.  Boil of Vulva Start warm compresses.  Not able to I&D.  No need for antibiotics at this time as minimal erythema surrounding area.  Truett Mainland, DO 09/09/2014 3:42 PM

## 2014-09-09 NOTE — Progress Notes (Signed)
[place] COMPREHENSIVE PROGRESS NOTE  [name] is a 24 y.o. G2P0010 at 18w5dwho is admitted for sickle cell crisis.  Estimated Date of Delivery: 12/25/14  Fetal presentation is unsure.  Length of Stay:  2 Days. Admitted 09/07/2014  Subjective: Patient reports that pain is improved after transfusion of one unit yesterday.  Wants to go home later today after Sickle Cell Service evaluation. Patient reports good fetal movement.  She reports no uterine contractions, no bleeding and no loss of fluid per vagina.  Vitals:  Blood pressure 108/64, pulse 84, temperature 99 F (37.2 C), temperature source Oral, resp. rate 20, height 5' 2"  (1.575 m), weight 163 lb (73.936 kg), last menstrual period 03/20/2014, SpO2 95 %. Physical Examination: General appearance: alert, cooperative and no distress HEENT: NCAT  Lungs: Normal respiratory effort Heart: Normal pulse, regular rhythm Abdomen: gravid, soft, non-tender  Extremities: No edema, DTR's 2+   Fetal monitoring: FHR: 130 bpm, Variability: moderate, Accelerations: Present, Decelerations: Absent  Uterine activity: no contractions per hour  Results for orders placed or performed during the hospital encounter of 09/07/14 (from the past 48 hour(s))  CBC WITH DIFFERENTIAL     Status: Abnormal   Collection Time: 09/07/14  3:39 PM  Result Value Ref Range   WBC 15.9 (H) 4.0 - 10.5 K/uL   RBC 2.74 (L) 3.87 - 5.11 MIL/uL   Hemoglobin 8.7 (L) 12.0 - 15.0 g/dL   HCT 25.2 (L) 36.0 - 46.0 %   MCV 92.0 78.0 - 100.0 fL   MCH 31.8 26.0 - 34.0 pg   MCHC 34.5 30.0 - 36.0 g/dL   RDW 16.4 (H) 11.5 - 15.5 %   Platelets 374 150 - 400 K/uL   Neutrophils Relative % 79 (H) 43 - 77 %   Neutro Abs 12.5 (H) 1.7 - 7.7 K/uL   Lymphocytes Relative 13 12 - 46 %   Lymphs Abs 2.0 0.7 - 4.0 K/uL   Monocytes Relative 6 3 - 12 %   Monocytes Absolute 1.0 0.1 - 1.0 K/uL   Eosinophils Relative 2 0 - 5 %   Eosinophils Absolute 0.4 0.0 - 0.7 K/uL    Basophils Relative 0 0 - 1 %   Basophils Absolute 0.0 0.0 - 0.1 K/uL  Comprehensive metabolic panel     Status: Abnormal   Collection Time: 09/07/14  3:39 PM  Result Value Ref Range   Sodium 138 135 - 145 mmol/L   Potassium 3.9 3.5 - 5.1 mmol/L   Chloride 110 96 - 112 mmol/L   CO2 20 19 - 32 mmol/L   Glucose, Bld 78 70 - 99 mg/dL   BUN <5 (L) 6 - 23 mg/dL   Creatinine, Ser 0.48 (L) 0.50 - 1.10 mg/dL   Calcium 8.6 8.4 - 10.5 mg/dL   Total Protein 6.4 6.0 - 8.3 g/dL   Albumin 3.3 (L) 3.5 - 5.2 g/dL   AST 16 0 - 37 U/L   ALT 11 0 - 35 U/L   Alkaline Phosphatase 52 39 - 117 U/L   Total Bilirubin 1.3 (H) 0.3 - 1.2 mg/dL   GFR calc non Af Amer >90 >90 mL/min   GFR calc Af Amer >90 >90 mL/min    Comment: (NOTE) The eGFR has been calculated using the CKD EPI equation. This calculation has not been validated in all clinical situations. eGFR's persistently <90 mL/min signify possible Chronic Kidney Disease.    Anion gap 8 5 - 15  Urinalysis, Routine w reflex  microscopic     Status: None   Collection Time: 09/07/14  5:22 PM  Result Value Ref Range   Color, Urine YELLOW YELLOW   APPearance CLEAR CLEAR   Specific Gravity, Urine 1.014 1.005 - 1.030   pH 6.5 5.0 - 8.0   Glucose, UA NEGATIVE NEGATIVE mg/dL   Hgb urine dipstick NEGATIVE NEGATIVE   Bilirubin Urine NEGATIVE NEGATIVE   Ketones, ur NEGATIVE NEGATIVE mg/dL   Protein, ur NEGATIVE NEGATIVE mg/dL   Urobilinogen, UA 1.0 0.0 - 1.0 mg/dL   Nitrite NEGATIVE NEGATIVE   Leukocytes, UA NEGATIVE NEGATIVE    Comment: MICROSCOPIC NOT DONE ON URINES WITH NEGATIVE PROTEIN, BLOOD, LEUKOCYTES, NITRITE, OR GLUCOSE <1000 mg/dL.  Urine culture     Status: None   Collection Time: 09/07/14  5:23 PM  Result Value Ref Range   Specimen Description URINE, CLEAN CATCH    Special Requests NONE    Colony Count      25,000 COLONIES/ML Performed at [facility]    Culture      Multiple bacterial morphotypes present, none predominant.  Suggest appropriate recollection if clinically indicated. Performed at [facility]    Report Status 09/09/2014 FINAL   CBC on admission     Status: Abnormal   Collection Time: 09/08/14  5:50 AM  Result Value Ref Range   WBC 15.0 (H) 4.0 - 10.5 K/uL   RBC 2.44 (L) 3.87 - 5.11 MIL/uL   Hemoglobin 7.7 (L) 12.0 - 15.0 g/dL   HCT 22.2 (L) 36.0 - 46.0 %   MCV 91.0 78.0 - 100.0 fL   MCH 31.6 26.0 - 34.0 pg   MCHC 34.7 30.0 - 36.0 g/dL   RDW 16.9 (H) 11.5 - 15.5 %   Platelets 302 150 - 400 K/uL  Lactate dehydrogenase     Status: None   Collection Time: 09/08/14  5:50 AM  Result Value Ref Range   LDH 204 94 - 250 U/L  Magnesium     Status: None   Collection Time: 09/08/14  5:50 AM  Result Value Ref Range   Magnesium 1.8 1.5 - 2.5 mg/dL  Type and screen     Status: None (Preliminary result)   Collection Time: 09/08/14  5:50 AM  Result Value Ref Range   ABO/RH(D) B POS    Antibody Screen NEG    Sample Expiration 09/11/2014    Unit Number [id number]    Blood Component Type RED CELLS,LR    Unit division 00    Status of Unit ALLOCATED    Donor AG Type      NEGATIVE FOR C ANTIGEN NEGATIVE FOR E ANTIGEN NEGATIVE FOR KELL ANTIGEN HEMOGLOBIN S NEGATIVE   Transfusion Status OK TO TRANSFUSE    Crossmatch Result Compatible    Unit Number [id number]    Blood Component Type RED CELLS,LR    Unit division 00    Status of Unit ISSUED    Donor AG Type      NEGATIVE FOR C ANTIGEN NEGATIVE FOR E ANTIGEN NEGATIVE FOR KELL ANTIGEN HEMOGLOBIN S NEGATIVE   Transfusion Status OK TO TRANSFUSE    Crossmatch Result Compatible   Reticulocytes     Status: Abnormal   Collection Time: 09/08/14  5:50 AM  Result Value Ref Range   Retic Ct Pct 18.5 (H) 0.4 - 3.1 %   RBC. 2.44 (L) 3.87 - 5.11 MIL/uL   Retic Count, Manual 451.4 (H) 19.0 - 186.0 K/uL  Prepare RBC  Status: None   Collection Time: 09/08/14  2:30 PM  Result Value Ref Range   Order Confirmation ORDER PROCESSED BY BLOOD BANK    CBC with Differential/Platelet     Status: Abnormal   Collection Time: 09/09/14  5:00 AM  Result Value Ref Range   WBC 15.9 (H) 4.0 - 10.5 K/uL   RBC 2.84 (L) 3.87 - 5.11 MIL/uL   Hemoglobin 8.6 (L) 12.0 - 15.0 g/dL   HCT 25.1 (L) 36.0 - 46.0 %   MCV 88.4 78.0 - 100.0 fL   MCH 30.3 26.0 - 34.0 pg   MCHC 34.3 30.0 - 36.0 g/dL   RDW 17.8 (H) 11.5 - 15.5 %   Platelets 293 150 - 400 K/uL   Neutrophils Relative % 76 43 - 77 %   Neutro Abs 12.1 (H) 1.7 - 7.7 K/uL   Lymphocytes Relative 13 12 - 46 %   Lymphs Abs 2.1 0.7 - 4.0 K/uL   Monocytes Relative 8 3 - 12 %   Monocytes Absolute 1.3 (H) 0.1 - 1.0 K/uL   Eosinophils Relative 3 0 - 5 %   Eosinophils Absolute 0.4 0.0 - 0.7 K/uL   Basophils Relative 0 0 - 1 %   Basophils Absolute 0.0 0.0 - 0.1 K/uL  Comprehensive metabolic panel     Status: Abnormal   Collection Time: 09/09/14  5:55 AM  Result Value Ref Range   Sodium 136 135 - 145 mmol/L   Potassium 3.8 3.5 - 5.1 mmol/L   Chloride 108 96 - 112 mmol/L   CO2 23 19 - 32 mmol/L   Glucose, Bld 98 70 - 99 mg/dL   BUN 6 6 - 23 mg/dL   Creatinine, Ser 0.46 (L) 0.50 - 1.10 mg/dL   Calcium 8.4 8.4 - 10.5 mg/dL   Total Protein 5.9 (L) 6.0 - 8.3 g/dL   Albumin 3.1 (L) 3.5 - 5.2 g/dL   AST 21 0 - 37 U/L   ALT 14 0 - 35 U/L   Alkaline Phosphatase 61 39 - 117 U/L   Total Bilirubin 1.7 (H) 0.3 - 1.2 mg/dL   GFR calc non Af Amer >90 >90 mL/min   GFR calc Af Amer >90 >90 mL/min    Comment: (NOTE) The eGFR has been calculated using the CKD EPI equation. This calculation has not been validated in all clinical situations. eGFR's persistently <90 mL/min signify possible Chronic Kidney Disease.    Anion gap 5 5 - 15    No results found.  Current scheduled medications . sodium chloride   Intravenous Once  . enoxaparin (LOVENOX) injection  40 mg Subcutaneous Q24H  . folic acid  1 mg Oral Daily  .  HYDROmorphone (DILAUDID) injection  2 mg Intravenous Q4H  . HYDROmorphone PCA 0.3 mg/mL    Intravenous 6 times per day  . methadone  5 mg Oral QHS  . prenatal vitamin w/FE, FA  1 tablet Oral Q1200  . senna-docusate  1 tablet Oral BID  . sodium chloride  10-40 mL Intracatheter Q12H    I have reviewed the patient's current medications.  ASSESSMENT: Patient Active Problem List   Diagnosis Date Noted  . Sickle cell crisis 09/07/2014  . 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 Supervision of high-risk pregnancy 06/11/2014  . Chronic pain 02/26/2013  . Anemia of chronic disease 10/05/2012  . 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 Mood swings 11/08/2011  . Migraines 11/08/2011  . Overweight(278.02) 05/24/2011  . GERD (gastroesophageal  reflux disease) 02/17/2011  . Depression 01/06/2011  . Back pain 09/17/2010  . Sickle cell disease 01/08/2009  . 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

## 2014-09-09 NOTE — Progress Notes (Signed)
SICKLE CELL SERVICE PROGRESS NOTE  [name] PIR:[id number] DOB: [birth year] DOA: 09/07/2014 PCP: Cecile Guevara A., MD  Assessment/Plan: Principal Problem:   Sickle cell crisis  1. Hb SS with crisis: Pt doing better. However she is receiving little relief from the current PCA bolus dose and is thus requiring increased clinician assisted doses. I will increase the PCA bolus based on weight and discontinue the scheduled clinician assisted doses. Anticipate discharge in the next 2 days. 2. Leukocytosis: Associated with crisis. Will monitor for signs of infection. 3. Anemia: Pt received a transfusion of 1 unit RBC's to keep Hb above 8. 4. Chronic pain   Code Status: Full Code Family Communication: N/A Disposition Plan: Not yet ready for discharge  [place].  Pager [phone]. If 7PM-7AM, please contact night-coverage.  09/09/2014, 9:32 AM  LOS: 2 days     HPI/Subjective: Pt states that she is feeling better but pain really only responding to clinician assisted doses.  Objective: Filed Vitals:   09/08/14 2048 09/08/14 2053 09/08/14 2056 09/08/14 2322  BP:      Pulse: 95 84    Temp:      TempSrc:      Resp:   18 20  Height:      Weight:      SpO2: 97% 95% 95%    Weight change:   Intake/Output Summary (Last 24 hours) at 09/09/14 0932 Last data filed at 09/09/14 0700  Gross per 24 hour  Intake   2512 ml  Output      0 ml  Net   2512 ml    General: Alert, awake, oriented x3, in no acute distress.  HEENT: [place]/AT PEERL, EOMI, anicteric Neck: Trachea midline,  no masses, no thyromegal,y no JVD, no carotid bruit OROPHARYNX:  Moist, No exudate/ erythema/lesions.  Heart: Regular rate and rhythm, without murmurs, rubs, gallops.  Lungs: Clear to auscultation, no wheezing or rhonchi noted.  Abdomen: Gravid abdomen Neuro: No focal neurological deficits noted cranial nerves II through XII grossly intact.  Strength at baseline in bilateral upper and lower  extremities. Musculoskeletal: No warm swelling or erythema around joints, no spinal tenderness noted. Psychiatric: Patient alert and oriented x3, good insight and cognition, good recent to remote recall.    Data Reviewed: Basic Metabolic Panel:  Recent Labs Lab 09/07/14 1539 09/08/14 0550 09/09/14 0555  NA 138  --  136  K 3.9  --  3.8  CL 110  --  108  CO2 20  --  23  GLUCOSE 78  --  98  BUN <5*  --  6  CREATININE 0.48*  --  0.46*  CALCIUM 8.6  --  8.4  MG  --  1.8  --    Liver Function Tests:  Recent Labs Lab 09/07/14 1539 09/09/14 0555  AST 16 21  ALT 11 14  ALKPHOS 52 61  BILITOT 1.3* 1.7*  PROT 6.4 5.9*  ALBUMIN 3.3* 3.1*   No results for input(s): LIPASE, AMYLASE in the last 168 hours. No results for input(s): AMMONIA in the last 168 hours. CBC:  Recent Labs Lab 09/07/14 1539 09/08/14 0550 09/09/14 0500  WBC 15.9* 15.0* 15.9*  NEUTROABS 12.5*  --  12.1*  HGB 8.7* 7.7* 8.6*  HCT 25.2* 22.2* 25.1*  MCV 92.0 91.0 88.4  PLT 374 302 293   Cardiac Enzymes: No results for input(s): CKTOTAL, CKMB, CKMBINDEX, TROPONINI in the last 168 hours. BNP (last 3 results) No results for input(s): BNP in the last 8760  hours.  ProBNP (last 3 results) No results for input(s): PROBNP in the last 8760 hours.  CBG: No results for input(s): GLUCAP in the last 168 hours.  Recent Results (from the past 240 hour(s))  Urine culture     Status: None   Collection Time: 09/07/14  5:23 PM  Result Value Ref Range Status   Specimen Description URINE, CLEAN CATCH  Final   Special Requests NONE  Final   Colony Count   Final    25,000 COLONIES/ML Performed at [facility]    Culture   Final    Multiple bacterial morphotypes present, none predominant. Suggest appropriate recollection if clinically indicated. Performed at [facility]    Report Status 09/09/2014 FINAL  Final     Studies: Dg Chest 2 View  08/19/2014   CLINICAL DATA:  Sickle cell crisis.  Body aches and slight cough. Patient is pregnant with abdominal shielding.  EXAM: CHEST  2 VIEW  COMPARISON:  08/09/2014  FINDINGS: Normal heart size and pulmonary vascularity. No focal airspace disease or consolidation in the lungs. No blunting of costophrenic angles. No pneumothorax. Mediastinal contours appear intact. Bone changes consistent with sickle cell.  IMPRESSION: No active cardiopulmonary disease.   Electronically Signed   By: Lucienne Capers M.D.   On: 08/19/2014 05:37   Ir Fluoro Guide Cv Line Left  09/04/2014   CLINICAL DATA:  Sickle cell disease, pregnant, needs IV access  EXAM: PICC PLACEMENT WITH ULTRASOUND AND FLUOROSCOPY  FLUOROSCOPY TIME:  0.1 minutes, 0.4 mGy  TECHNIQUE: After written informed consent was obtained, patient was placed in the supine position on angiographic table. Patency of the left brachial vein was confirmed with ultrasound with image documentation. An appropriate skin site was determined. Skin site was marked. Region was prepped using maximum barrier technique including cap and mask, sterile gown, sterile gloves, large sterile sheet, and Chlorhexidine as cutaneous antisepsis. The region was infiltrated locally with 1% lidocaine. Under real-time ultrasound guidance, the left brachial vein was accessed with a 21 gauge micropuncture needle; the needle tip within the vein was confirmed with ultrasound image documentation. Needle exchanged over a 018 guidewire for a peel-away sheath, through which a 5-French double-lumen power injectable PICC trimmed to 38cm was advanced, positioned with its tip near the cavoatrial junction. Spot chest radiograph confirms appropriate catheter position. Catheter was flushed per protocol and secured externally. The patient tolerated procedure well.  COMPLICATIONS: COMPLICATIONS none  IMPRESSION: 1. Technically successful five [place] double lumen power injectable PICC placement   Electronically Signed   By: Lucrezia Europe M.D.   On: 09/04/2014 12:42    Ir US Guide Vasc Access Left  09/04/2014   CLINICAL DATA:  Sickle cell disease, pregnant, needs IV access  EXAM: PICC PLACEMENT WITH ULTRASOUND AND FLUOROSCOPY  FLUOROSCOPY TIME:  0.1 minutes, 0.4 mGy  TECHNIQUE: After written informed consent was obtained, patient was placed in the supine position on angiographic table. Patency of the left brachial vein was confirmed with ultrasound with image documentation. An appropriate skin site was determined. Skin site was marked. Region was prepped using maximum barrier technique including cap and mask, sterile gown, sterile gloves, large sterile sheet, and Chlorhexidine as cutaneous antisepsis. The region was infiltrated locally with 1% lidocaine. Under real-time ultrasound guidance, the left brachial vein was accessed with a 21 gauge micropuncture needle; the needle tip within the vein was confirmed with ultrasound image documentation. Needle exchanged over a 018 guidewire for a peel-away sheath, through which a 5-French  double-lumen power injectable PICC trimmed to 38cm was advanced, positioned with its tip near the cavoatrial junction. Spot chest radiograph confirms appropriate catheter position. Catheter was flushed per protocol and secured externally. The patient tolerated procedure well.  COMPLICATIONS: COMPLICATIONS none  IMPRESSION: 1. Technically successful five [place] double lumen power injectable PICC placement   Electronically Signed   By: Lucrezia Europe M.D.   On: 09/04/2014 12:42    Scheduled Meds: . sodium chloride   Intravenous Once  . enoxaparin (LOVENOX) injection  40 mg Subcutaneous Q24H  . folic acid  1 mg Oral Daily  .  HYDROmorphone (DILAUDID) injection  2 mg Intravenous Once  . HYDROmorphone PCA 0.3 mg/mL   Intravenous 6 times per day  . methadone  5 mg Oral QHS  . prenatal vitamin w/FE, FA  1 tablet Oral Q1200  . senna-docusate  1 tablet Oral BID  . sodium chloride  10-40 mL Intracatheter Q12H   Continuous Infusions: . dextrose 5 %  and 0.45% NaCl 125 mL/hr at 09/09/14 0725   Time spent 30 minutes

## 2014-09-09 NOTE — Progress Notes (Signed)
Patient c/o possible "boil" on left labia majora. Patient states she has been "messing with it" and some "pus" came out. Approx. 2.5cm x 1cm raised area on left labia majora with no drainage at present. Patient encouraged to stop touching the area. Will notify physician.

## 2014-09-10 DIAGNOSIS — Z331 Pregnant state, incidental: Secondary | ICD-10-CM

## 2014-09-10 DIAGNOSIS — D5700 Hb-SS disease with crisis, unspecified: Secondary | ICD-10-CM

## 2014-09-10 DIAGNOSIS — N764 Abscess of vulva: Secondary | ICD-10-CM

## 2014-09-10 MED ORDER — HEPARIN SOD (PORK) LOCK FLUSH 100 UNIT/ML IV SOLN
250.0000 [IU] | INTRAVENOUS | Status: DC | PRN
  Filled 2014-09-10: qty 3

## 2014-09-10 MED ORDER — HEPARIN SOD (PORK) LOCK FLUSH 100 UNIT/ML IV SOLN
250.0000 [IU] | INTRAVENOUS | Status: DC | PRN
  Administered 2014-09-10: 250 [IU]

## 2014-09-10 MED ORDER — OXYCODONE HCL 5 MG PO TABS
15.0000 mg | ORAL_TABLET | ORAL | Status: DC
  Administered 2014-09-10: 15 mg via ORAL
  Filled 2014-09-10: qty 3

## 2014-09-10 MED ORDER — SODIUM CHLORIDE 0.9 % IJ SOLN
10.0000 mL | Freq: Once | INTRAMUSCULAR | Status: AC
  Administered 2014-09-10: 10 mL via INTRAVENOUS
  Filled 2014-09-10: qty 10

## 2014-09-10 MED ORDER — OXYCODONE HCL 5 MG PO TABS: 15.0000 mg | ORAL_TABLET | ORAL | Status: DC

## 2014-09-10 MED ORDER — HYDROMORPHONE HCL 2 MG/ML IJ SOLN
2.0000 mg | INTRAMUSCULAR | Status: DC | PRN
  Administered 2014-09-10: 2 mg via INTRAVENOUS
  Filled 2014-09-10: qty 1

## 2014-09-10 NOTE — Consult Note (Signed)
SICKLE CELL SERVICE PROGRESS NOTE  [name] GLO:[id number] DOB: Jan 17, 1991 DOA: 09/07/2014 PCP: Karrisa Didio A., MD    Recommendations:   1. Okay for discharge from Sickle Cell Standpoint 2. Make an appointment to follow up in the primary care clinic [phone]) 3. Pick up prescription for pain medication at [facility] ([facility]) 4. Flush and re-dress PICC line before discharge. Pt to follow up weekly for PICC line care at [facility].   Assessment/Plan:  1. Hb SS with crisis: Pt doing better. I will discontinue PCa  And transition to oral analgesics. She is stable to be discharged from my perspective. She will pick up her prescription for her opiate analgesics from our office upon discharge. 2. Leukocytosis: Associated with crisis. Pt has boil on the vulva. Defer to Ob-Gyn. 3. Anemia: Pt received a transfusion of 1 unit RBC's to keep Hb above 8. Hb stable. Pt to follow up in the clinic for labs  (Hemoglobin electrophoresis on 09/24/2014 and possible exchange transfusion the next week).  4. Chronic pain: Continue Methadone and Oxycodone through pregnancy. Will discuss weaning once pateint has delivered her baby.   Code Status: Full Code Family Communication: N/A Disposition Plan: Not yet ready for discharge  [place].  Pager [phone]. If 7PM-7AM, please contact night-coverage.  09/10/2014, 9:20 AM  LOS: 3 days     HPI/Subjective: Pt states that she is feeling better.   Objective: Filed Vitals:   09/09/14 2346 09/10/14 0005 09/10/14 0231 09/10/14 0600  BP:      Pulse: 108     Temp:      TempSrc:      Resp:  18 14 16   Height:      Weight:      SpO2: 97%      Weight change:  No intake or output data in the 24 hours ending 09/10/14 0920  General: Alert, awake, oriented x3. Well appearing.  HEENT: [place]/AT PEERL, EOMI, anicteric Neck: Trachea midline,  no masses, no thyromegal,y no JVD, no carotid bruit OROPHARYNX:   Moist, No exudate/ erythema/lesions.  Heart: Regular rate and rhythm, without murmurs, rubs, gallops.  Lungs: Clear to auscultation, no wheezing or rhonchi noted.  Abdomen: Gravid abdomen Neuro: No focal neurological deficits noted cranial nerves II through XII grossly intact.  Strength at baseline in bilateral upper and lower extremities. Musculoskeletal: No warm swelling or erythema around joints, no spinal tenderness noted. Psychiatric: Patient alert and oriented x3, good insight and cognition, good recent to remote recall.    Data Reviewed: Basic Metabolic Panel:  Recent Labs Lab 09/07/14 1539 09/08/14 0550 09/09/14 0555  NA 138  --  136  K 3.9  --  3.8  CL 110  --  108  CO2 20  --  23  GLUCOSE 78  --  98  BUN <5*  --  6  CREATININE 0.48*  --  0.46*  CALCIUM 8.6  --  8.4  MG  --  1.8  --    Liver Function Tests:  Recent Labs Lab 09/07/14 1539 09/09/14 0555  AST 16 21  ALT 11 14  ALKPHOS 52 61  BILITOT 1.3* 1.7*  PROT 6.4 5.9*  ALBUMIN 3.3* 3.1*   No results for input(s): LIPASE, AMYLASE in the last 168 hours. No results for input(s): AMMONIA in the last 168 hours. CBC:  Recent Labs Lab 09/07/14 1539 09/08/14 0550 09/09/14 0500  WBC 15.9* 15.0* 15.9*  NEUTROABS 12.5*  --  12.1*  HGB  8.7* 7.7* 8.6*  HCT 25.2* 22.2* 25.1*  MCV 92.0 91.0 88.4  PLT 374 302 293   Cardiac Enzymes: No results for input(s): CKTOTAL, CKMB, CKMBINDEX, TROPONINI in the last 168 hours. BNP (last 3 results) No results for input(s): BNP in the last 8760 hours.  ProBNP (last 3 results) No results for input(s): PROBNP in the last 8760 hours.  CBG: No results for input(s): GLUCAP in the last 168 hours.  Recent Results (from the past 240 hour(s))  Urine culture     Status: None   Collection Time: 09/07/14  5:23 PM  Result Value Ref Range Status   Specimen Description URINE, CLEAN CATCH  Final   Special Requests NONE  Final   Colony Count   Final    25,000  COLONIES/ML Performed at [facility]    Culture   Final    Multiple bacterial morphotypes present, none predominant. Suggest appropriate recollection if clinically indicated. Performed at [facility]    Report Status 09/09/2014 FINAL  Final     Studies: Dg Chest 2 View  08/19/2014   CLINICAL DATA:  Sickle cell crisis. Body aches and slight cough. Patient is pregnant with abdominal shielding.  EXAM: CHEST  2 VIEW  COMPARISON:  08/09/2014  FINDINGS: Normal heart size and pulmonary vascularity. No focal airspace disease or consolidation in the lungs. No blunting of costophrenic angles. No pneumothorax. Mediastinal contours appear intact. Bone changes consistent with sickle cell.  IMPRESSION: No active cardiopulmonary disease.   Electronically Signed   By: Lucienne Capers M.D.   On: 08/19/2014 05:37   Ir Fluoro Guide Cv Line Left  09/04/2014   CLINICAL DATA:  Sickle cell disease, pregnant, needs IV access  EXAM: PICC PLACEMENT WITH ULTRASOUND AND FLUOROSCOPY  FLUOROSCOPY TIME:  0.1 minutes, 0.4 mGy  TECHNIQUE: After written informed consent was obtained, patient was placed in the supine position on angiographic table. Patency of the left brachial vein was confirmed with ultrasound with image documentation. An appropriate skin site was determined. Skin site was marked. Region was prepped using maximum barrier technique including cap and mask, sterile gown, sterile gloves, large sterile sheet, and Chlorhexidine as cutaneous antisepsis. The region was infiltrated locally with 1% lidocaine. Under real-time ultrasound guidance, the left brachial vein was accessed with a 21 gauge micropuncture needle; the needle tip within the vein was confirmed with ultrasound image documentation. Needle exchanged over a 018 guidewire for a peel-away sheath, through which a 5-French double-lumen power injectable PICC trimmed to 38cm was advanced, positioned with its tip near the cavoatrial junction. Spot  chest radiograph confirms appropriate catheter position. Catheter was flushed per protocol and secured externally. The patient tolerated procedure well.  COMPLICATIONS: COMPLICATIONS none  IMPRESSION: 1. Technically successful five [place] double lumen power injectable PICC placement   Electronically Signed   By: Lucrezia Europe M.D.   On: 09/04/2014 12:42   Ir US Guide Vasc Access Left  09/04/2014   CLINICAL DATA:  Sickle cell disease, pregnant, needs IV access  EXAM: PICC PLACEMENT WITH ULTRASOUND AND FLUOROSCOPY  FLUOROSCOPY TIME:  0.1 minutes, 0.4 mGy  TECHNIQUE: After written informed consent was obtained, patient was placed in the supine position on angiographic table. Patency of the left brachial vein was confirmed with ultrasound with image documentation. An appropriate skin site was determined. Skin site was marked. Region was prepped using maximum barrier technique including cap and mask, sterile gown, sterile gloves, large sterile sheet, and Chlorhexidine as cutaneous antisepsis. The region  was infiltrated locally with 1% lidocaine. Under real-time ultrasound guidance, the left brachial vein was accessed with a 21 gauge micropuncture needle; the needle tip within the vein was confirmed with ultrasound image documentation. Needle exchanged over a 018 guidewire for a peel-away sheath, through which a 5-French double-lumen power injectable PICC trimmed to 38cm was advanced, positioned with its tip near the cavoatrial junction. Spot chest radiograph confirms appropriate catheter position. Catheter was flushed per protocol and secured externally. The patient tolerated procedure well.  COMPLICATIONS: COMPLICATIONS none  IMPRESSION: 1. Technically successful five [place] double lumen power injectable PICC placement   Electronically Signed   By: Lucrezia Europe M.D.   On: 09/04/2014 12:42    Scheduled Meds: . sodium chloride   Intravenous Once  . enoxaparin (LOVENOX) injection  40 mg Subcutaneous Q24H  . folic acid   1 mg Oral Daily  . HYDROmorphone PCA 0.3 mg/mL   Intravenous 6 times per day  . methadone  5 mg Oral QHS  . prenatal vitamin w/FE, FA  1 tablet Oral Q1200  . senna-docusate  1 tablet Oral BID  . sodium chloride  10-40 mL Intracatheter Q12H   Continuous Infusions: . dextrose 5 % and 0.45% NaCl 125 mL/hr at 09/10/14 0811   Time spent 35 minutes

## 2014-09-10 NOTE — Care Management Note (Signed)
  Page 1 of 1   09/10/2014     1:41:33 PM CARE MANAGEMENT NOTE 09/10/2014  Patient:  [name], [name]   Account Number:  [id number]  Date Initiated:  09/10/2014  Documentation initiated by:  Juliet Rude  Subjective/Objective Assessment:   pregnant and sickle cell     Action/Plan:   Anticipated DC Date:  09/11/2014   Anticipated DC Plan:                     Status of service:   Medicare Important Message given?  YES (If response is "NO", the following Medicare IM given date fields will be blank) Date Medicare IM given:  09/10/2014 Medicare IM given by:  Juliet Rude Date Additional Medicare IM given:   Additional Medicare IM given by:

## 2014-09-10 NOTE — Discharge Summary (Signed)
Physician Discharge Summary  Patient ID: [name] MRN: [id number] DOB/AGE: 1991-01-14 24 y.o.  Admit date: 09/07/2014 Discharge date: 09/10/2014  Admission Diagnoses: Sickle Cell Crisis  Discharge Diagnoses:  Principal Problem:   Sickle cell crisis Active Problems:   Sickle cell disease   Boil of vulva   Discharged Condition: good  Hospital Course: [name] was admitted on 09/07/2014 for right-sided back and joint pain, consistent with her previous Sickle Cell crisis but unable to control pain with home medications. Sickle Cell Service was consulted and initially recommended pain control with regular high-dose Dilaudid PCA, Dilaudid 2 mg IV q4h and 2 mg IV q4h prn breakthrough. Pain control improved and Sickle Cell Service suggested discontinuing PCA and transitioning to oral analgesics. Also during admission, patient's hemoglobin dropped to 7.7 and she was given 1 unit of pRBC's which brought hemoglobin up to 8.7 after which patient felt much better and requested discharge. Patient also developed a boil on her vulva and has been using warm compresses on the lesion. Sickle Cell Service recommends discharging the patient at this time.  Consults: Sickle Cell Service  Significant Diagnostic Studies: labs: CBC, CMP, Reticulocyte Count, Type and Screen, Magnesium, LDH, and Urine Culture  Treatments: Dilaudid and Methadone for pain control; 1 unit pRBC for anemia   Discharge Exam: Blood pressure 101/63, pulse 103, temperature 99.3 F (37.4 C), temperature source Oral, resp. rate 18, height 5\' 2"  (1.575 m), weight 163 lb (73.936 kg), last menstrual period 03/20/2014, SpO2 94 %. General appearance: alert and cooperative Resp: no respiratory distress Extremities: extremities normal, atraumatic, no cyanosis or edema  Disposition: 01-Home or Self Care   Sickle cell crisis: - Will pick up rx for opiate analgesics from [facility].  - Per Sickle Cell Service, will  continue Methadone and Oxycodone for pain control throughout the pregnancy. - Will follow-up in clinic for routine labs to maintain hemoglobin above 8.0 during pregnancy.  Boil of vulva: - Continue to apply warm compresses - Follow-up at regular OB appointment, no indication for abx at this time  Has PICC line, declined to stay to have dressing changed by line team, I changed dressing without complication in sterile fashion, flushed with 10cc NS and with heparin afterwards.      Discharge Instructions    Diet - low sodium heart healthy    Complete by:  As directed      Increase activity slowly    Complete by:  As directed             Medication List    TAKE these medications        acetaminophen 500 MG tablet  Commonly known as:  TYLENOL  Take 1,000 mg by mouth every 6 (six) hours as needed for moderate pain or headache.     folic acid 1 MG tablet  Commonly known as:  FOLVITE  Take 2 tablets (2 mg total) by mouth daily.     methadone 5 MG tablet  Commonly known as:  DOLOPHINE  Take 1 tablet (5 mg total) by mouth at bedtime.     oxyCODONE 15 MG immediate release tablet  Commonly known as:  ROXICODONE  Take 1 tablet (15 mg total) by mouth every 4 (four) hours as needed for pain.     prenatal multivitamin Tabs tablet  Take 1 tablet by mouth daily at 12 noon.       Follow-up Information    Follow up with [facility] In 1 week.  Specialty:  Obstetrics and Gynecology   Contact information:   [place] [phone]      Signed: Merla Riches,   09/10/2014, 2:04 PM

## 2014-09-10 NOTE — Discharge Instructions (Signed)
Sickle Cell Anemia, Adult Sickle cell anemia is a condition in which red blood cells have an abnormal "sickle" shape. This abnormal shape shortens the cells' life span, which results in a lower than normal concentration of red blood cells in the blood. The sickle shape also causes the cells to clump together and block free blood flow through the blood vessels. As a result, the tissues and organs of the body do not receive enough oxygen. Sickle cell anemia causes organ damage and pain and increases the risk of infection. CAUSES  Sickle cell anemia is a genetic disorder. Those who receive two copies of the gene have the condition, and those who receive one copy have the trait. RISK FACTORS The sickle cell gene is most common in people whose families originated in [place]. Other areas of the globe where sickle cell trait occurs include the [place], [place] and [place], and the [place].  SIGNS AND SYMPTOMS  Pain, especially in the extremities, back, chest, or abdomen (common). The pain may start suddenly or may develop following an illness, especially if there is dehydration. Pain can also occur due to overexertion or exposure to extreme temperature changes.  Frequent severe bacterial infections, especially certain types of pneumonia and meningitis.  Pain and swelling in the hands and feet.  Decreased activity.   Loss of appetite.   Change in behavior.  Headaches.  Seizures.  Shortness of breath or difficulty breathing.  Vision changes.  Skin ulcers. Those with the trait may not have symptoms or they may have mild symptoms.  DIAGNOSIS  Sickle cell anemia is diagnosed with blood tests that demonstrate the genetic trait. It is often diagnosed during the newborn period, due to mandatory testing nationwide. A variety of blood tests, X-rays, CT scans, MRI scans, ultrasounds, and lung function tests may also be done to monitor the condition. TREATMENT  Sickle  cell anemia may be treated with: 1. Medicines. You may be given pain medicines, antibiotic medicines (to treat and prevent infections) or medicines to increase the production of certain types of hemoglobin. 2. Fluids. 3. Oxygen. 4. Blood transfusions. HOME CARE INSTRUCTIONS   Drink enough fluid to keep your urine clear or pale yellow. Increase your fluid intake in hot weather and during exercise.  Do not smoke. Smoking lowers oxygen levels in the blood.   Only take over-the-counter or prescription medicines for pain, fever, or discomfort as directed by your health care provider.  Take antibiotics as directed by your health care provider. Make sure you finish them it even if you start to feel better.   Take supplements as directed by your health care provider.   Consider wearing a medical alert bracelet. This tells anyone caring for you in an emergency of your condition.   When traveling, keep your medical information, health care provider's names, and the medicines you take with you at all times.   If you develop a fever, do not take medicines to reduce the fever right away. This could cover up a problem that is developing. Notify your health care provider.  Keep all follow-up appointments with your health care provider. Sickle cell anemia requires regular medical care. SEEK MEDICAL CARE IF: You have a fever. SEEK IMMEDIATE MEDICAL CARE IF:   You feel dizzy or faint.   You have new abdominal pain, especially on the left side near the stomach area.   You develop a persistent, often uncomfortable and painful penile erection (priapism). If this is not treated immediately it  will lead to impotence.   You have numbness your arms or legs or you have a hard time moving them.   You have a hard time with speech.   You have a fever or persistent symptoms for more than 2-3 days.   You have a fever and your symptoms suddenly get worse.   You have signs or symptoms of  infection. These include:   Chills.   Abnormal tiredness (lethargy).   Irritability.   Poor eating.   Vomiting.   You develop pain that is not helped with medicine.   You develop shortness of breath.  You have pain in your chest.   You are coughing up pus-like or bloody sputum.   You develop a stiff neck.  Your feet or hands swell or have pain.  Your abdomen appears bloated.  You develop joint pain. MAKE SURE YOU:  Understand these instructions. Document Released: 08/10/2005 Document Revised: 09/16/2013 Document Reviewed: 12/12/2012 [facility] Patient Information 2015 [place], [place]. This information is not intended to replace advice given to you by your health care provider. Make sure you discuss any questions you have with your health care provider. Fetal Movement Counts Patient Name: __________________________________________________ Patient Due Date: ____________________ Performing a fetal movement count is highly recommended in high-risk pregnancies, but it is good for every pregnant woman to do. Your health care provider may ask you to start counting fetal movements at 28 weeks of the pregnancy. Fetal movements often increase:  After eating a full meal.  After physical activity.  After eating or drinking something sweet or cold.  At rest. Pay attention to when you feel the baby is most active. This will help you notice a pattern of your baby's sleep and wake cycles and what factors contribute to an increase in fetal movement. It is important to perform a fetal movement count at the same time each day when your baby is normally most active.  HOW TO COUNT FETAL MOVEMENTS 5. Find a quiet and comfortable area to sit or lie down on your left side. Lying on your left side provides the best blood and oxygen circulation to your baby. 6. Write down the day and time on a sheet of paper or in a journal. 7. Start counting kicks, flutters, swishes, rolls, or jabs in a  2-hour period. You should feel at least 10 movements within 2 hours. 8. If you do not feel 10 movements in 2 hours, wait 2-3 hours and count again. Look for a change in the pattern or not enough counts in 2 hours. SEEK MEDICAL CARE IF:  You feel less than 10 counts in 2 hours, tried twice.  There is no movement in over an hour.  The pattern is changing or taking longer each day to reach 10 counts in 2 hours.  You feel the baby is not moving as he or she usually does. Date: ____________ Movements: ____________ Start time: ____________ Elizebeth Koller time: ____________  Date: ____________ Movements: ____________ Start time: ____________ Elizebeth Koller time: ____________ Date: ____________ Movements: ____________ Start time: ____________ Elizebeth Koller time: ____________ Date: ____________ Movements: ____________ Start time: ____________ Elizebeth Koller time: ____________ Date: ____________ Movements: ____________ Start time: ____________ Elizebeth Koller time: ____________ Date: ____________ Movements: ____________ Start time: ____________ Elizebeth Koller time: ____________ Date: ____________ Movements: ____________ Start time: ____________ Elizebeth Koller time: ____________ Date: ____________ Movements: ____________ Start time: ____________ Elizebeth Koller time: ____________  Date: ____________ Movements: ____________ Start time: ____________ Elizebeth Koller time: ____________ Date: ____________ Movements: ____________ Start time: ____________ Elizebeth Koller time: ____________ Date: ____________ Movements: ____________  Start time: ____________ Elizebeth Koller time: ____________ Date: ____________ Movements: ____________ Start time: ____________ Elizebeth Koller time: ____________ Date: ____________ Movements: ____________ Start time: ____________ Elizebeth Koller time: ____________ Date: ____________ Movements: ____________ Start time: ____________ Elizebeth Koller time: ____________ Date: ____________ Movements: ____________ Start time: ____________ Elizebeth Koller time: ____________  Date: ____________ Movements:  ____________ Start time: ____________ Elizebeth Koller time: ____________ Date: ____________ Movements: ____________ Start time: ____________ Elizebeth Koller time: ____________ Date: ____________ Movements: ____________ Start time: ____________ Elizebeth Koller time: ____________ Date: ____________ Movements: ____________ Start time: ____________ Elizebeth Koller time: ____________ Date: ____________ Movements: ____________ Start time: ____________ Elizebeth Koller time: ____________ Date: ____________ Movements: ____________ Start time: ____________ Elizebeth Koller time: ____________ Date: ____________ Movements: ____________ Start time: ____________ Elizebeth Koller time: ____________  Date: ____________ Movements: ____________ Start time: ____________ Elizebeth Koller time: ____________ Date: ____________ Movements: ____________ Start time: ____________ Elizebeth Koller time: ____________ Date: ____________ Movements: ____________ Start time: ____________ Elizebeth Koller time: ____________ Date: ____________ Movements: ____________ Start time: ____________ Elizebeth Koller time: ____________ Date: ____________ Movements: ____________ Start time: ____________ Elizebeth Koller time: ____________ Date: ____________ Movements: ____________ Start time: ____________ Elizebeth Koller time: ____________ Date: ____________ Movements: ____________ Start time: ____________ Elizebeth Koller time: ____________  Date: ____________ Movements: ____________ Start time: ____________ Elizebeth Koller time: ____________ Date: ____________ Movements: ____________ Start time: ____________ Elizebeth Koller time: ____________ Date: ____________ Movements: ____________ Start time: ____________ Elizebeth Koller time: ____________ Date: ____________ Movements: ____________ Start time: ____________ Elizebeth Koller time: ____________ Date: ____________ Movements: ____________ Start time: ____________ Elizebeth Koller time: ____________ Date: ____________ Movements: ____________ Start time: ____________ Elizebeth Koller time: ____________ Date: ____________ Movements: ____________ Start time: ____________ Elizebeth Koller  time: ____________  Date: ____________ Movements: ____________ Start time: ____________ Elizebeth Koller time: ____________ Date: ____________ Movements: ____________ Start time: ____________ Elizebeth Koller time: ____________ Date: ____________ Movements: ____________ Start time: ____________ Elizebeth Koller time: ____________ Date: ____________ Movements: ____________ Start time: ____________ Elizebeth Koller time: ____________ Date: ____________ Movements: ____________ Start time: ____________ Elizebeth Koller time: ____________ Date: ____________ Movements: ____________ Start time: ____________ Elizebeth Koller time: ____________ Date: ____________ Movements: ____________ Start time: ____________ Elizebeth Koller time: ____________  Date: ____________ Movements: ____________ Start time: ____________ Elizebeth Koller time: ____________ Date: ____________ Movements: ____________ Start time: ____________ Elizebeth Koller time: ____________ Date: ____________ Movements: ____________ Start time: ____________ Elizebeth Koller time: ____________ Date: ____________ Movements: ____________ Start time: ____________ Elizebeth Koller time: ____________ Date: ____________ Movements: ____________ Start time: ____________ Elizebeth Koller time: ____________ Date: ____________ Movements: ____________ Start time: ____________ Elizebeth Koller time: ____________ Date: ____________ Movements: ____________ Start time: ____________ Elizebeth Koller time: ____________  Date: ____________ Movements: ____________ Start time: ____________ Elizebeth Koller time: ____________ Date: ____________ Movements: ____________ Start time: ____________ Elizebeth Koller time: ____________ Date: ____________ Movements: ____________ Start time: ____________ Elizebeth Koller time: ____________ Date: ____________ Movements: ____________ Start time: ____________ Elizebeth Koller time: ____________ Date: ____________ Movements: ____________ Start time: ____________ Elizebeth Koller time: ____________ Date: ____________ Movements: ____________ Start time: ____________ Elizebeth Koller time: ____________ Document Released:  06/01/2006 Document Revised: 09/16/2013 Document Reviewed: 02/27/2012 [employer] Patient Information 2015 [place]. This information is not intended to replace advice given to you by your health care provider. Make sure you discuss any questions you have with your health care provider.

## 2014-09-10 NOTE — Progress Notes (Signed)
IV team reports flush and redress ETA of around 15:00 today. Discussed ETA with pt. She requesed to keep her appointment at the sickle cell center tomorrow. Sickle cell center contacted to confirm patient still has her appointment. Appointment confirmed.

## 2014-09-10 NOTE — Progress Notes (Signed)
Patient ID: KELE WITHEM, female   DOB: 1990-06-30, 24 y.o.   MRN: [id number] FACULTY PRACTICE ANTEPARTUM NOTE  MAGDALINA WHITEHEAD is a 24 y.o. G2P0010 at [redacted]w[redacted]d  who is admitted for sickle cell crisis.   Fetal presentation is unsure. Length of Stay:  3  Days  Subjective: Reports pain at 5/10 today.  Still using PCA pump with demand.  Has been using warm compresses - boils about the same. Patient reports good fetal movement.  She reports no uterine contractions, no bleeding and no loss of fluid per vagina.  Vitals:  Blood pressure 101/63, pulse 108, temperature 99.3 F (37.4 C), temperature source Oral, resp. rate 16, height 5\' 2"  (1.575 m), weight 163 lb (73.936 kg), last menstrual period 03/20/2014, SpO2 97 %. Physical Examination:  General appearance - alert, well appearing, and in no distress Mental status - alert, oriented to person, place, and time Chest - clear to auscultation, no wheezes, rales or rhonchi, symmetric air entry Heart - normal rate, regular rhythm, normal S1, S2, no murmurs, rubs, clicks or gallops Abdomen - soft, nontender, nondistended, no masses or organomegaly Pelvic - VULVA: 2 tender indurated boils on left vulva approximately 1.5cm in diameters.  No fluctuance. Fundal Height:  size equals dates Extremities: extremities normal, atraumatic, no cyanosis or edema  Membranes:intact  Fetal Monitoring:  Baseline: 145 bpm, Variability: Good {> 6 bpm), Accelerations: Non-reactive but appropriate for gestational age and Decelerations: Absent  Labs:  No results found for this or any previous visit (from the past 24 hour(s)).  Imaging Studies:    None    Medications:  Scheduled . sodium chloride   Intravenous Once  . enoxaparin (LOVENOX) injection  40 mg Subcutaneous Q24H  . folic acid  1 mg Oral Daily  . HYDROmorphone PCA 0.3 mg/mL   Intravenous 6 times per day  . methadone  5 mg Oral QHS  . prenatal vitamin w/FE, FA  1 tablet Oral Q1200  . senna-docusate  1  tablet Oral BID  . sodium chloride  10-40 mL Intracatheter Q12H   I have reviewed the patient's current medications.  ASSESSMENT: Patient Active Problem List   Diagnosis Date Noted  . Boil of vulva 09/09/2014  . Sickle cell crisis 09/07/2014  . 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 Supervision of high-risk pregnancy 06/11/2014  . Chronic pain 02/26/2013  . Anemia of chronic disease 10/05/2012  . 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 Mood swings 11/08/2011  . Migraines 11/08/2011  . Overweight(278.02) 05/24/2011  . GERD (gastroesophageal reflux disease) 02/17/2011  . Depression 01/06/2011  . Back pain 09/17/2010  . Sickle cell disease 01/08/2009  . 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  Sickle cell Crisis 2.  Chronic Pain 3.  Boil of Vulva 4.  Supervision of High Risk Pregnancy.  PLAN: Continue pain control.  Appreciate Sickle Cell Team's recommendations and input.  Their note was reviewed. Continue warm compresses - no fluctuance.  Appears somewhat improved.  If comes to head, will be able to I&D FHT appears appropriate for gestational age.   Continue routine antenatal care.   Tanna Savoy Benetta Maclaren, DO 09/10/2014,7:55 AM

## 2014-09-10 NOTE — Progress Notes (Signed)
PICC line dressing changed by Dr. Deniece Ree using sterile tech. Hats and masks worn by all in attendance including patient. Each lumen flushed with 10cc saline and followed by 250 units of Heparin in each by Dr. Deniece Ree. Caps not changed at this time. Pt. Encouraged to keep her appointment at the sickle cell center tomorrow and inquire with them about cap changes. Patient states that she will.

## 2014-09-11 ENCOUNTER — Ambulatory Visit (HOSPITAL_COMMUNITY): Admission: RE | Admit: 2014-09-11 | Payer: Medicare Other | Source: Ambulatory Visit

## 2014-09-12 LAB — TYPE AND SCREEN
ABO/RH(D): B POS
Antibody Screen: NEGATIVE
UNIT DIVISION: 0
Unit division: 0

## 2014-09-17 ENCOUNTER — Ambulatory Visit (HOSPITAL_COMMUNITY)
Admission: RE | Admit: 2014-09-17 | Discharge: 2014-09-17 | Disposition: A | Discharge status: Home / Self Care | Payer: Medicare Other | Source: Ambulatory Visit | Attending: Obstetrics & Gynecology | Admitting: Obstetrics & Gynecology

## 2014-09-17 DIAGNOSIS — Z3A25 25 weeks gestation of pregnancy: Secondary | ICD-10-CM

## 2014-09-17 DIAGNOSIS — D72829 Elevated white blood cell count, unspecified: Secondary | ICD-10-CM | POA: Diagnosis not present

## 2014-09-17 DIAGNOSIS — O9989 Other specified diseases and conditions complicating pregnancy, childbirth and the puerperium: Secondary | ICD-10-CM | POA: Diagnosis present

## 2014-09-17 DIAGNOSIS — D5700 Hb-SS disease with crisis, unspecified: Secondary | ICD-10-CM | POA: Diagnosis present

## 2014-09-17 DIAGNOSIS — G8929 Other chronic pain: Secondary | ICD-10-CM | POA: Diagnosis present

## 2014-09-17 DIAGNOSIS — I959 Hypotension, unspecified: Secondary | ICD-10-CM | POA: Diagnosis not present

## 2014-09-17 DIAGNOSIS — F1190 Opioid use, unspecified, uncomplicated: Secondary | ICD-10-CM

## 2014-09-17 DIAGNOSIS — O99012 Anemia complicating pregnancy, second trimester: Secondary | ICD-10-CM

## 2014-09-17 DIAGNOSIS — O99412 Diseases of the circulatory system complicating pregnancy, second trimester: Secondary | ICD-10-CM | POA: Diagnosis not present

## 2014-09-17 DIAGNOSIS — O99112 Other diseases of the blood and blood-forming organs and certain disorders involving the immune mechanism complicating pregnancy, second trimester: Secondary | ICD-10-CM | POA: Diagnosis not present

## 2014-09-17 DIAGNOSIS — O99322 Drug use complicating pregnancy, second trimester: Secondary | ICD-10-CM | POA: Insufficient documentation

## 2014-09-17 DIAGNOSIS — Z79899 Other long term (current) drug therapy: Secondary | ICD-10-CM

## 2014-09-17 DIAGNOSIS — R509 Fever, unspecified: Secondary | ICD-10-CM | POA: Diagnosis not present

## 2014-09-17 DIAGNOSIS — Z91040 Latex allergy status: Secondary | ICD-10-CM

## 2014-09-17 DIAGNOSIS — Z9049 Acquired absence of other specified parts of digestive tract: Secondary | ICD-10-CM | POA: Diagnosis present

## 2014-09-17 DIAGNOSIS — Z36 Encounter for antenatal screening of mother: Secondary | ICD-10-CM

## 2014-09-17 DIAGNOSIS — T402X5A Adverse effect of other opioids, initial encounter: Secondary | ICD-10-CM | POA: Diagnosis present

## 2014-09-17 DIAGNOSIS — O99612 Diseases of the digestive system complicating pregnancy, second trimester: Secondary | ICD-10-CM | POA: Diagnosis not present

## 2014-09-17 DIAGNOSIS — O09292 Supervision of pregnancy with other poor reproductive or obstetric history, second trimester: Secondary | ICD-10-CM

## 2014-09-17 DIAGNOSIS — D571 Sickle-cell disease without crisis: Secondary | ICD-10-CM

## 2014-09-17 DIAGNOSIS — Z91018 Allergy to other foods: Secondary | ICD-10-CM

## 2014-09-17 DIAGNOSIS — Z79891 Long term (current) use of opiate analgesic: Secondary | ICD-10-CM

## 2014-09-17 DIAGNOSIS — K5909 Other constipation: Secondary | ICD-10-CM | POA: Diagnosis not present

## 2014-09-17 DIAGNOSIS — Z87891 Personal history of nicotine dependence: Secondary | ICD-10-CM

## 2014-09-17 DIAGNOSIS — Z3A26 26 weeks gestation of pregnancy: Secondary | ICD-10-CM | POA: Diagnosis present

## 2014-09-18 ENCOUNTER — Telehealth (HOSPITAL_COMMUNITY): Payer: Self-pay | Admitting: Hematology

## 2014-09-18 ENCOUNTER — Inpatient Hospital Stay (HOSPITAL_COMMUNITY)
Admission: AD | Admit: 2014-09-18 | Discharge: 2014-09-26 | DRG: 781 | Disposition: A | Discharge status: Home / Self Care | Payer: Medicare Other | Source: Ambulatory Visit | Attending: Obstetrics & Gynecology | Admitting: Obstetrics & Gynecology

## 2014-09-18 ENCOUNTER — Encounter (HOSPITAL_COMMUNITY): Payer: Self-pay | Admitting: *Deleted

## 2014-09-18 DIAGNOSIS — O99012 Anemia complicating pregnancy, second trimester: Secondary | ICD-10-CM | POA: Diagnosis not present

## 2014-09-18 DIAGNOSIS — O0990 Supervision of high risk pregnancy, unspecified, unspecified trimester: Secondary | ICD-10-CM

## 2014-09-18 DIAGNOSIS — O99412 Diseases of the circulatory system complicating pregnancy, second trimester: Secondary | ICD-10-CM | POA: Diagnosis not present

## 2014-09-18 DIAGNOSIS — I959 Hypotension, unspecified: Secondary | ICD-10-CM | POA: Diagnosis not present

## 2014-09-18 DIAGNOSIS — D5700 Hb-SS disease with crisis, unspecified: Secondary | ICD-10-CM | POA: Diagnosis present

## 2014-09-18 DIAGNOSIS — G8929 Other chronic pain: Secondary | ICD-10-CM | POA: Diagnosis present

## 2014-09-18 LAB — URINALYSIS, ROUTINE W REFLEX MICROSCOPIC
BILIRUBIN URINE: NEGATIVE
Glucose, UA: NEGATIVE mg/dL
HGB URINE DIPSTICK: NEGATIVE
Ketones, ur: NEGATIVE mg/dL
NITRITE: NEGATIVE
PH: 6 (ref 5.0–8.0)
Protein, ur: NEGATIVE mg/dL
Specific Gravity, Urine: 1.02 (ref 1.005–1.030)
Urobilinogen, UA: 0.2 mg/dL (ref 0.0–1.0)

## 2014-09-18 LAB — URINE MICROSCOPIC-ADD ON

## 2014-09-18 LAB — CBC WITH DIFFERENTIAL/PLATELET
BASOS PCT: 0 % (ref 0–1)
Basophils Absolute: 0 10*3/uL (ref 0.0–0.1)
EOS PCT: 0 % (ref 0–5)
Eosinophils Absolute: 0 10*3/uL (ref 0.0–0.7)
HCT: 25.7 % — ABNORMAL LOW (ref 36.0–46.0)
Hemoglobin: 9.2 g/dL — ABNORMAL LOW (ref 12.0–15.0)
LYMPHS PCT: 10 % — AB (ref 12–46)
Lymphs Abs: 1.5 10*3/uL (ref 0.7–4.0)
MCH: 31 pg (ref 26.0–34.0)
MCHC: 35.8 g/dL (ref 30.0–36.0)
MCV: 86.5 fL (ref 78.0–100.0)
MONOS PCT: 5 % (ref 3–12)
Monocytes Absolute: 0.7 10*3/uL (ref 0.1–1.0)
NEUTROS PCT: 85 % — AB (ref 43–77)
Neutro Abs: 12.7 10*3/uL — ABNORMAL HIGH (ref 1.7–7.7)
PLATELETS: 328 10*3/uL (ref 150–400)
RBC: 2.97 MIL/uL — ABNORMAL LOW (ref 3.87–5.11)
RDW: 16.7 % — AB (ref 11.5–15.5)
WBC: 14.9 10*3/uL — ABNORMAL HIGH (ref 4.0–10.5)

## 2014-09-18 LAB — COMPREHENSIVE METABOLIC PANEL
ALBUMIN: 3.3 g/dL — AB (ref 3.5–5.0)
ALT: 18 U/L (ref 14–54)
AST: 21 U/L (ref 15–41)
Alkaline Phosphatase: 51 U/L (ref 38–126)
Anion gap: 4 — ABNORMAL LOW (ref 5–15)
BUN: <5 mg/dL — ABNORMAL LOW (ref 6–20)
CALCIUM: 8.5 mg/dL — AB (ref 8.9–10.3)
CO2: 22 mmol/L (ref 22–32)
Chloride: 109 mmol/L (ref 101–111)
Creatinine, Ser: 0.38 mg/dL — ABNORMAL LOW (ref 0.44–1.00)
GFR calc Af Amer: >60 mL/min (ref >60)
GFR calc non Af Amer: >60 mL/min (ref >60)
Glucose, Bld: 92 mg/dL (ref 70–99)
Potassium: 3.7 mmol/L (ref 3.5–5.1)
SODIUM: 135 mmol/L (ref 135–145)
TOTAL PROTEIN: 6.7 g/dL (ref 6.5–8.1)
Total Bilirubin: 1.3 mg/dL — ABNORMAL HIGH (ref 0.3–1.2)

## 2014-09-18 LAB — RETICULOCYTES
RBC.: 2.97 MIL/uL — AB (ref 3.87–5.11)
RETIC CT PCT: 15.6 % — AB (ref 0.4–3.1)
Retic Count, Absolute: 463.3 10*3/uL — ABNORMAL HIGH (ref 19.0–186.0)

## 2014-09-18 LAB — FETAL FIBRONECTIN: FETAL FIBRONECTIN: NEGATIVE

## 2014-09-18 MED ORDER — OXYCODONE HCL 5 MG PO TABS
15.0000 mg | ORAL_TABLET | ORAL | Status: DC | PRN
  Administered 2014-09-18 – 2014-09-19 (×2): 15 mg via ORAL
  Filled 2014-09-18 (×2): qty 3

## 2014-09-18 MED ORDER — HYDROMORPHONE HCL 1 MG/ML IJ SOLN: 1.0000 mg | INTRAMUSCULAR | Status: DC | PRN

## 2014-09-18 MED ORDER — SODIUM CHLORIDE 0.9 % IV BOLUS (SEPSIS)
2000.0000 mL | Freq: Once | INTRAVENOUS | Status: AC
  Administered 2014-09-18: 2000 mL via INTRAVENOUS

## 2014-09-18 MED ORDER — NALOXONE HCL 0.4 MG/ML IJ SOLN: 0.4000 mg | INTRAMUSCULAR | Status: DC | PRN

## 2014-09-18 MED ORDER — HYDROMORPHONE HCL 2 MG/ML IJ SOLN
2.0000 mg | INTRAMUSCULAR | Status: DC
  Administered 2014-09-18 – 2014-09-19 (×8): 2 mg via INTRAVENOUS
  Filled 2014-09-18 (×7): qty 1

## 2014-09-18 MED ORDER — SODIUM CHLORIDE 0.9 % IV SOLN
INTRAVENOUS | Status: DC
  Administered 2014-09-18 – 2014-09-22 (×8): via INTRAVENOUS
  Filled 2014-09-18 (×9): qty 1000

## 2014-09-18 MED ORDER — ACETAMINOPHEN 500 MG PO TABS
1000.0000 mg | ORAL_TABLET | Freq: Four times a day (QID) | ORAL | Status: DC | PRN
  Administered 2014-09-20: 1000 mg via ORAL
  Filled 2014-09-18 (×2): qty 2

## 2014-09-18 MED ORDER — SENNOSIDES-DOCUSATE SODIUM 8.6-50 MG PO TABS
1.0000 | ORAL_TABLET | Freq: Two times a day (BID) | ORAL | Status: DC
  Administered 2014-09-19 – 2014-09-26 (×13): 1 via ORAL
  Filled 2014-09-18 (×14): qty 1

## 2014-09-18 MED ORDER — DIPHENHYDRAMINE HCL 12.5 MG/5ML PO ELIX
12.5000 mg | ORAL_SOLUTION | Freq: Four times a day (QID) | ORAL | Status: DC | PRN
  Filled 2014-09-18 (×3): qty 5

## 2014-09-18 MED ORDER — ONDANSETRON HCL 4 MG/2ML IJ SOLN
4.0000 mg | INTRAMUSCULAR | Status: DC | PRN
  Administered 2014-09-19 – 2014-09-25 (×9): 4 mg via INTRAVENOUS
  Filled 2014-09-18 (×6): qty 2

## 2014-09-18 MED ORDER — FOLIC ACID 1 MG PO TABS: 1.0000 mg | ORAL_TABLET | Freq: Every day | ORAL | Status: DC

## 2014-09-18 MED ORDER — ENOXAPARIN SODIUM 40 MG/0.4ML ~~LOC~~ SOLN
40.0000 mg | SUBCUTANEOUS | Status: DC
  Administered 2014-09-18 – 2014-09-25 (×8): 40 mg via SUBCUTANEOUS
  Filled 2014-09-18 (×9): qty 0.4

## 2014-09-18 MED ORDER — HYDROMORPHONE 0.3 MG/ML IV SOLN
INTRAVENOUS | Status: DC
  Administered 2014-09-18 (×2): via INTRAVENOUS
  Administered 2014-09-18: 4.19 mg via INTRAVENOUS
  Administered 2014-09-19: 6.28 mg via INTRAVENOUS
  Administered 2014-09-19: 15:00:00 via INTRAVENOUS
  Administered 2014-09-19: 6.28 mg via INTRAVENOUS
  Administered 2014-09-19: 2.31 mg via INTRAVENOUS
  Administered 2014-09-19: 4.2 mg via INTRAVENOUS
  Administered 2014-09-19: 3.16 mg via INTRAVENOUS
  Administered 2014-09-19: 3.65 mg via INTRAVENOUS
  Administered 2014-09-20: 0 mg via INTRAVENOUS
  Administered 2014-09-20: 06:00:00 via INTRAVENOUS
  Administered 2014-09-20: 9.09 mg via INTRAVENOUS
  Administered 2014-09-20: 1 mg via INTRAVENOUS
  Administered 2014-09-20: 4.89 mg via INTRAVENOUS
  Administered 2014-09-20 – 2014-09-21 (×3): via INTRAVENOUS
  Administered 2014-09-21: 6.29 mg via INTRAVENOUS
  Administered 2014-09-21: 6.11 mg via INTRAVENOUS
  Administered 2014-09-21: 11:00:00 via INTRAVENOUS
  Administered 2014-09-21: 4.59 mg via INTRAVENOUS
  Administered 2014-09-21: 2.64 mg via INTRAVENOUS
  Administered 2014-09-21: 18:00:00 via INTRAVENOUS
  Administered 2014-09-21: 3.49 mg via INTRAVENOUS
  Administered 2014-09-21: 04:00:00 via INTRAVENOUS
  Administered 2014-09-22: 6.66 mg via INTRAVENOUS
  Administered 2014-09-22: 8 mg via INTRAVENOUS
  Administered 2014-09-22 (×3): via INTRAVENOUS
  Administered 2014-09-22: 7.11 mg via INTRAVENOUS
  Administered 2014-09-22: 11:00:00 via INTRAVENOUS
  Administered 2014-09-22: 5.84 mg via INTRAVENOUS
  Administered 2014-09-22: 14:00:00 via INTRAVENOUS
  Administered 2014-09-23: 1.4 mg via INTRAVENOUS
  Administered 2014-09-23 (×3): via INTRAVENOUS
  Administered 2014-09-23: 8.17 mg via INTRAVENOUS
  Administered 2014-09-23: 5.37 mg via INTRAVENOUS
  Administered 2014-09-23: 6.13 mg via INTRAVENOUS
  Administered 2014-09-23: 4.28 mg via INTRAVENOUS
  Administered 2014-09-23: 7.97 mg via INTRAVENOUS
  Administered 2014-09-23 – 2014-09-24 (×2): via INTRAVENOUS
  Administered 2014-09-24: 6.89 mg via INTRAVENOUS
  Administered 2014-09-24 (×2): via INTRAVENOUS
  Administered 2014-09-24: 7.21 mg via INTRAVENOUS
  Administered 2014-09-24: 13.72 mg via INTRAVENOUS
  Filled 2014-09-18 (×24): qty 25

## 2014-09-18 MED ORDER — ONDANSETRON HCL 4 MG/2ML IJ SOLN
4.0000 mg | Freq: Four times a day (QID) | INTRAMUSCULAR | Status: DC | PRN
Start: 2014-09-18 — End: 2014-09-26
  Administered 2014-09-18: 4 mg via INTRAVENOUS
  Filled 2014-09-18 (×4): qty 2

## 2014-09-18 MED ORDER — HYDROCODONE-ACETAMINOPHEN 5-325 MG PO TABS: 1.0000 | ORAL_TABLET | ORAL | Status: DC | PRN

## 2014-09-18 MED ORDER — HYDROMORPHONE HCL 2 MG/ML IJ SOLN
2.0000 mg | INTRAMUSCULAR | Status: DC | PRN
  Administered 2014-09-18 (×2): 2 mg via INTRAVENOUS
  Filled 2014-09-18 (×3): qty 1

## 2014-09-18 MED ORDER — COMPLETENATE 29-1 MG PO CHEW
1.0000 | CHEWABLE_TABLET | Freq: Every day | ORAL | Status: DC
  Administered 2014-09-21 – 2014-09-25 (×5): 1 via ORAL
  Filled 2014-09-18 (×9): qty 1

## 2014-09-18 MED ORDER — DEXTROSE-NACL 5-0.45 % IV SOLN
INTRAVENOUS | Status: DC
  Administered 2014-09-18 – 2014-09-19 (×3): via INTRAVENOUS

## 2014-09-18 MED ORDER — FAMOTIDINE IN NACL 20-0.9 MG/50ML-% IV SOLN
20.0000 mg | Freq: Two times a day (BID) | INTRAVENOUS | Status: DC
  Administered 2014-09-19 – 2014-09-23 (×5): 20 mg via INTRAVENOUS
  Filled 2014-09-18 (×17): qty 50

## 2014-09-18 MED ORDER — POLYETHYLENE GLYCOL 3350 17 G PO PACK
17.0000 g | PACK | Freq: Every day | ORAL | Status: DC | PRN
  Filled 2014-09-18: qty 1

## 2014-09-18 MED ORDER — FAMOTIDINE 20 MG PO TABS
20.0000 mg | ORAL_TABLET | Freq: Two times a day (BID) | ORAL | Status: DC
  Administered 2014-09-18 – 2014-09-26 (×11): 20 mg via ORAL
  Filled 2014-09-18 (×12): qty 1

## 2014-09-18 MED ORDER — FOLIC ACID 1 MG PO TABS
2.0000 mg | ORAL_TABLET | Freq: Every day | ORAL | Status: DC
  Administered 2014-09-18 – 2014-09-26 (×8): 2 mg via ORAL
  Filled 2014-09-18 (×10): qty 2

## 2014-09-18 MED ORDER — SODIUM CHLORIDE 0.9 % IJ SOLN: 9.0000 mL | INTRAMUSCULAR | Status: DC | PRN

## 2014-09-18 MED ORDER — METHADONE HCL 10 MG PO TABS
5.0000 mg | ORAL_TABLET | Freq: Every day | ORAL | Status: DC
  Administered 2014-09-18 – 2014-09-24 (×7): 5 mg via ORAL
  Administered 2014-09-25: 10 mg via ORAL
  Filled 2014-09-18 (×9): qty 1

## 2014-09-18 MED ORDER — DIPHENHYDRAMINE HCL 50 MG/ML IJ SOLN
12.5000 mg | Freq: Four times a day (QID) | INTRAMUSCULAR | Status: DC | PRN
  Administered 2014-09-19 – 2014-09-25 (×11): 12.5 mg via INTRAVENOUS
  Filled 2014-09-18 (×11): qty 1

## 2014-09-18 MED ORDER — ONDANSETRON HCL 4 MG PO TABS
4.0000 mg | ORAL_TABLET | ORAL | Status: DC | PRN
  Filled 2014-09-18: qty 1

## 2014-09-18 NOTE — Progress Notes (Signed)
Pt requesting to talk to MD about getting a PCA started, MD made aware and will plan to talk to patients sickle cell physician

## 2014-09-18 NOTE — MAU Provider Note (Signed)
History     CSN: [id number]  Arrival date and time: 09/18/14 1019   First Provider Initiated Contact with Patient 09/18/14 1104      Chief Complaint  Patient presents with  . Sickle Cell Pain Crisis   Sickle Cell Pain Crisis Associated symptoms include nausea, vomiting and weakness (2/2 pain). Pertinent negatives include no abdominal pain, chest pain, chills, fever or headaches.   Patient is 24 y.o. G2P0010 [redacted]w[redacted]d here with complaints of low back pain and vomiting x3.  Patient reports that symptoms began yesterday.  She called her Sickle cell provider who recommended she be evaluated here.  She states that she had 3 episodes of vomiting yesterday morning and has since not been tolerating solids.  She states that she can tolerate water.  She reports that back pain is NOT controlled with home medications (Methadone 5mg  qHS and Oxycodone 15mg  q4PRN).  She reports having taken 2 doses of her PRN in the last 24 hours.  She denies CP, SOB, headache, pain elsewhere in her body, diarrhea, fevers.  Her back pain is congruent with her typical pain and is 8/10 achy, sharp and localized to her low back.  She of note, was hospitalized last week for similar symptoms.  She has had nothing in the vagina in 24 hours. +FM, denies LOF, VB, contractions, vaginal discharge.  OB History    Gravida Para Term Preterm AB TAB SAB Ectopic Multiple Living   2    1  1          Obstetric Comments   Miscarried in October 2012 at about 7 weeks      Past Medical History  Diagnosis Date  . Miscarriage 03/22/2011    Pt reports 2 miscarriages.  . Depression 01/06/2011  . GERD (gastroesophageal reflux disease) 02/17/2011  . Trichotillomania     h/o  . Blood transfusion     "lots"  . Sickle cell anemia with crisis   . Exertional dyspnea     "sometimes"  . Migraines 11/08/11    "@ least twice/month"  . Chronic back pain     "very severe; have knot in my back; from tight muscle; take RX and exercise for it"  . Mood  swings 11/08/11    "I go back and forth; real bad"  . Sickle cell anemia     Past Surgical History  Procedure Laterality Date  . Cholecystectomy  05/2010  . Dilation and curettage of uterus  02/20/11    S/P miscarriage    Family History  Problem Relation Age of Onset  . Alcoholism    . Depression    . Hypercholesterolemia    . Hypertension    . Migraines    . Diabetes Maternal Grandmother   . Diabetes Paternal Grandmother   . Diabetes Maternal Grandfather   . Sickle cell trait Mother   . Sickle cell trait Father     History  Substance Use Topics  . Smoking status: Former Smoker -- 0.25 packs/day for 1 years    Types: Cigarettes    Quit date: 03/25/2013  . Smokeless tobacco: Never Used  . Alcohol Use: No     Comment: pt states she quit marijuan in May 2013. Rare ETOH, + cigarettes.  She is enrolled in school    Allergies:  Allergies  Allergen Reactions  . Carrot Oil Hives and Swelling  . Latex Rash    Prescriptions prior to admission  Medication Sig Dispense Refill Last Dose  . folic acid (FOLVITE)  1 MG tablet Take 2 tablets (2 mg total) by mouth daily. 60 tablet 2 09/17/2014 at Unknown time  . methadone (DOLOPHINE) 5 MG tablet Take 1 tablet (5 mg total) by mouth at bedtime. 30 tablet 0 09/17/2014 at Unknown time  . oxyCODONE (ROXICODONE) 15 MG immediate release tablet Take 1 tablet (15 mg total) by mouth every 4 (four) hours as needed for pain. 90 tablet 0 09/18/2014 at Unknown time  . Prenatal Vit-Min-FA-Fish Oil ([facility] PRENATAL GUMMY) 0.4-113.5 MG CHEW Chew 2 each by mouth daily.   09/17/2014 at Unknown time  . acetaminophen (TYLENOL) 500 MG tablet Take 1,000 mg by mouth every 6 (six) hours as needed for moderate pain or headache.   prn    Review of Systems  Constitutional: Negative for fever and chills.  Respiratory: Negative for shortness of breath and wheezing.   Cardiovascular: Negative for chest pain and palpitations.  Gastrointestinal: Positive for nausea and  vomiting. Negative for abdominal pain and diarrhea.  Musculoskeletal: Positive for back pain. Negative for falls.  Neurological: Positive for weakness (2/2 pain). Negative for headaches.   Physical Exam   Blood pressure 104/64, pulse 94, temperature 98.6 F (37 C), resp. rate 18, height 5\' 2"  (1.575 m), weight 166 lb (75.297 kg), last menstrual period 03/20/2014, SpO2 100 %.  Physical Exam  Constitutional: She is oriented to person, place, and time. She appears well-developed and well-nourished. She appears distressed (appears uncomfortable.  cannot get comfortable in bed).  HENT:  Head: Normocephalic and atraumatic.  Eyes: EOM are normal. No scleral icterus.  Neck: Normal range of motion.  Cardiovascular: Normal rate, regular rhythm, normal heart sounds and intact distal pulses.   Respiratory: Effort normal and breath sounds normal. No respiratory distress. She has no wheezes.  GI: Soft. There is no rebound and no guarding.  gravid  Musculoskeletal: Normal range of motion. She exhibits no edema.  AROM intact but somewhat limited 2/2 pain.  NO bony abnormalities noted.  +TTP along sacral base  Neurological: She is alert and oriented to person, place, and time.  Skin: Skin is warm and dry.  Psychiatric: She has a normal mood and affect. Her behavior is normal. Thought content normal.   Dilation: Closed Effacement (%): 50 Cervical Position: Posterior, [place]:  (out of pelvis) Exam by:: Jolynn  Fetal monitoringBaseline: 140 bpm, Variability: Good {> 6 bpm) and 10x10 Uterine activity: uterine irritation   Results for orders placed or performed during the hospital encounter of 09/18/14 (from the past 24 hour(s))  CBC with Differential/Platelet     Status: Abnormal   Collection Time: 09/18/14 11:30 AM  Result Value Ref Range   WBC 14.9 (H) 4.0 - 10.5 K/uL   RBC 2.97 (L) 3.87 - 5.11 MIL/uL   Hemoglobin 9.2 (L) 12.0 - 15.0 g/dL   HCT 25.7 (L) 36.0 - 46.0 %   MCV 86.5 78.0 -  100.0 fL   MCH 31.0 26.0 - 34.0 pg   MCHC 35.8 30.0 - 36.0 g/dL   RDW 16.7 (H) 11.5 - 15.5 %   Platelets 328 150 - 400 K/uL   Neutrophils Relative % 85 (H) 43 - 77 %   Lymphocytes Relative 10 (L) 12 - 46 %   Monocytes Relative 5 3 - 12 %   Eosinophils Relative 0 0 - 5 %   Basophils Relative 0 0 - 1 %   Neutro Abs 12.7 (H) 1.7 - 7.7 K/uL   Lymphs Abs 1.5 0.7 - 4.0 K/uL  Monocytes Absolute 0.7 0.1 - 1.0 K/uL   Eosinophils Absolute 0.0 0.0 - 0.7 K/uL   Basophils Absolute 0.0 0.0 - 0.1 K/uL   RBC Morphology SICKLE CELLS   Reticulocytes     Status: Abnormal   Collection Time: 09/18/14 11:30 AM  Result Value Ref Range   Retic Ct Pct 15.6 (H) 0.4 - 3.1 %   RBC. 2.97 (L) 3.87 - 5.11 MIL/uL   Retic Count, Manual 463.3 (H) 19.0 - 186.0 K/uL  Comprehensive metabolic panel     Status: Abnormal   Collection Time: 09/18/14 11:30 AM  Result Value Ref Range   Sodium 135 135 - 145 mmol/L   Potassium 3.7 3.5 - 5.1 mmol/L   Chloride 109 101 - 111 mmol/L   CO2 22 22 - 32 mmol/L   Glucose, Bld 92 70 - 99 mg/dL   BUN <5 (L) 6 - 20 mg/dL   Creatinine, Ser 0.38 (L) 0.44 - 1.00 mg/dL   Calcium 8.5 (L) 8.9 - 10.3 mg/dL   Total Protein 6.7 6.5 - 8.1 g/dL   Albumin 3.3 (L) 3.5 - 5.0 g/dL   AST 21 15 - 41 U/L   ALT 18 14 - 54 U/L   Alkaline Phosphatase 51 38 - 126 U/L   Total Bilirubin 1.3 (H) 0.3 - 1.2 mg/dL   GFR calc non Af Amer >60 >60 mL/min   GFR calc Af Amer >60 >60 mL/min   Anion gap 4 (L) 5 - 15  Fetal fibronectin     Status: None   Collection Time: 09/18/14  1:40 PM  Result Value Ref Range   Fetal Fibronectin NEGATIVE NEGATIVE  Urinalysis, Routine w reflex microscopic     Status: Abnormal   Collection Time: 09/18/14  4:20 PM  Result Value Ref Range   Color, Urine YELLOW YELLOW   APPearance HAZY (A) CLEAR   Specific Gravity, Urine 1.020 1.005 - 1.030   pH 6.0 5.0 - 8.0   Glucose, UA NEGATIVE NEGATIVE mg/dL   Hgb urine dipstick NEGATIVE NEGATIVE   Bilirubin Urine NEGATIVE NEGATIVE    Ketones, ur NEGATIVE NEGATIVE mg/dL   Protein, ur NEGATIVE NEGATIVE mg/dL   Urobilinogen, UA 0.2 0.0 - 1.0 mg/dL   Nitrite NEGATIVE NEGATIVE   Leukocytes, UA TRACE (A) NEGATIVE  Urine microscopic-add on     Status: Abnormal   Collection Time: 09/18/14  4:20 PM  Result Value Ref Range   Squamous Epithelial / LPF FEW (A) RARE   WBC, UA 0-2 <3 WBC/hpf    MAU Course  Procedures  MDM CBC with diff Retics CMET UA with cx FFN IVF @1 .59maintenence Zofran 4mg  IV q4 PRN Dilaudid 4mg  IV q4 scheduled and q4 PRN  Assessment and Plan  Patient does not appear to be in labor but does appear to be in Sickle cell pain crisis. -consider c/s to patient's sickle cell provider  -Will admit to ante.  Please see Dr Vladimir Creeks admission note  Janora Norlander, DO 09/18/2014, 5:48 PM

## 2014-09-18 NOTE — Telephone Encounter (Signed)
Spoke with Dr. Jonelle Sidle.  Ad[facility] since she is [redacted] weeks pregnant.  Patient also states she was due for a dressing change on her PICC line today.  I explained if she was going to [facility] hospital for treatment then they could do the dressing change there.  Advised patient to call and schedule next weeks dressing change.  Patient verbalizes understanding.

## 2014-09-18 NOTE — MAU Note (Signed)
Pt reports her pain from her sickle cell has gotten worse today. Called her sicle cell doctor and she was instructed to come here for treatment.Marland Kitchen

## 2014-09-18 NOTE — Telephone Encounter (Signed)
Patient C/O back pain that is 8/10 on pain scale.  Patient states the pain has kept her from sleeping.  Patient also states she is [redacted] weeks pregnant.  Patient wants to know if she can come to [facility] for treatment or if she needs to go to [facility] hospital.  I explained I would notify the physician and give her a call back.  Patient verbalizes understanding.

## 2014-09-18 NOTE — H&P (Signed)
FACULTY PRACTICE ANTEPARTUM ADMISSION HISTORY AND PHYSICAL NOTE   History of Present Illness: [name] is a 24 y.o. G2P0010 at [redacted]w[redacted]d admitted for Sickle cell crises. Pt reports that she has had pain in her back for 24 hours. She took 2 doses of oxycontin 12 hours apart with no relief of sx.  She reports that she feels that she needs to be admitted.  She states that she has no other pain at present.  She says that with the dilaudid her pain is 4/10 but, she does not feel that she can be controlled as an outpt.  She has no fetal concerns. Patient reports the fetal movement as active. Patient reports uterine contraction  activity as none. Patient reports  vaginal bleeding as none. Patient describes fluid per vagina as None. Fetal presentation is unsure.  Patient Active Problem List   Diagnosis Date Noted  . Boil of vulva 09/09/2014  . Sickle cell crisis 09/07/2014  . 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 Supervision of high-risk pregnancy 06/11/2014  . Chronic pain 02/26/2013  . Anemia of chronic disease 10/05/2012  . 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 Mood swings 11/08/2011  . Migraines 11/08/2011  . Overweight(278.02) 05/24/2011  . GERD (gastroesophageal reflux disease) 02/17/2011  . Depression 01/06/2011  . Back pain 09/17/2010  . Sickle cell disease 01/08/2009  . 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 Miscarriage 03/22/2011    Pt reports 2 miscarriages.  . Depression 01/06/2011  . GERD (gastroesophageal reflux disease) 02/17/2011  . Trichotillomania     h/o  . Blood transfusion     "lots"  . Sickle cell anemia with crisis   . Exertional dyspnea     "sometimes"  . Migraines 11/08/11    "@ least twice/month"  . Chronic back pain     "very severe; have knot in my back; from tight muscle; take RX and exercise for it"  . Mood swings 11/08/11    "I go back and forth; real bad"  . Sickle cell anemia     Past Surgical  History  Procedure Laterality Date  . Cholecystectomy  05/2010  . Dilation and curettage of uterus  02/20/11    S/P miscarriage    OB History  Gravida Para Term Preterm AB SAB TAB Ectopic Multiple Living  2    1 1         # Outcome Date GA Lbr Len/2nd Weight Sex Delivery Anes PTL Lv  2 Current           1 SAB 02/20/11             Comments: System Generated. Please review and update pregnancy details.    Obstetric Comments  Miscarried in October 2012 at about 7 weeks    History   Social History  . Marital Status: Single    Spouse Name: N/A  . Number of Children: N/A  . Years of Education: N/A   Social History Main Topics  . Smoking status: Former Smoker -- 0.25 packs/day for 1 years    Types: Cigarettes    Quit date: 03/25/2013  . Smokeless tobacco: Never Used  . Alcohol Use: No     Comment: pt states she quit marijuan in May 2013. Rare ETOH, + cigarettes.  She is enrolled in school  . Drug Use: No  . Sexual Activity: Not Currently    Birth Control/ Protection: None   Other Topics Concern  .  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 Alcoholism    . Depression    . Hypercholesterolemia    . Hypertension    . Migraines    . Diabetes Maternal Grandmother   . Diabetes Paternal Grandmother   . Diabetes Maternal Grandfather   . Sickle cell trait Mother   . Sickle cell trait Father     Allergies  Allergen Reactions  . Carrot Oil Hives and Swelling  . Latex Rash    Prescriptions prior to admission  Medication Sig Dispense Refill Last Dose  . folic acid (FOLVITE) 1 MG tablet Take 2 tablets (2 mg total) by mouth daily. 60 tablet 2 09/17/2014 at Unknown time  . methadone (DOLOPHINE) 5 MG tablet Take 1 tablet (5 mg total) by mouth at bedtime. 30 tablet 0 09/17/2014 at Unknown time  . oxyCODONE (ROXICODONE) 15 MG  immediate release tablet Take 1 tablet (15 mg total) by mouth every 4 (four) hours as needed for pain. 90 tablet 0 09/18/2014 at Unknown time  . Prenatal Vit-Min-FA-Fish Oil ([facility] PRENATAL GUMMY) 0.4-113.5 MG CHEW Chew 2 each by mouth daily.   09/17/2014 at Unknown time  . acetaminophen (TYLENOL) 500 MG tablet Take 1,000 mg by mouth every 6 (six) hours as needed for moderate pain or headache.   prn    Review of Systems - Negative except pain in sacrum.  has not had pain crises here since high school.  Vitals:  BP 104/64 mmHg  Pulse 94  Temp(Src) 98.6 F (37 C)  Resp 18  SpO2 100%  LMP 03/20/2014 Physical Examination: GENERAL: Well-developed, well-nourished female in no acute distress. Appears uncomfortable LUNGS: Clear to auscultation bilaterally.  HEART: Regular rate and rhythm. ABDOMEN: Soft, nontender, nondistended. No organomegaly. EXTREMITIES: No cyanosis, clubbing, or edema CERVIX: Evaluated by digital exam. and found to be closed/thick and high done by midwife)   Membranes:intact Fetal Monitoring:Baseline: 150's bpm, Variability: Good {> 6 bpm) and Accelerations: Reactive Tocometer: Flat  Labs:  Results for orders placed or performed during the hospital encounter of 09/18/14 (from the past 24 hour(s))  CBC with Differential/Platelet   Collection Time: 09/18/14 11:30 AM  Result Value Ref Range   WBC 14.9 (H) 4.0 - 10.5 K/uL   RBC 2.97 (L) 3.87 - 5.11 MIL/uL   Hemoglobin 9.2 (L) 12.0 - 15.0 g/dL   HCT 25.7 (L) 36.0 - 46.0 %   MCV 86.5 78.0 - 100.0 fL   MCH 31.0 26.0 - 34.0 pg   MCHC 35.8 30.0 - 36.0 g/dL   RDW 16.7 (H) 11.5 - 15.5 %   Platelets 328 150 - 400 K/uL   Neutrophils Relative % 85 (H) 43 - 77 %   Lymphocytes Relative 10 (L) 12 - 46 %   Monocytes Relative 5 3 - 12 %   Eosinophils Relative 0 0 - 5 %   Basophils Relative 0 0 - 1 %   Neutro Abs 12.7 (H) 1.7 - 7.7 K/uL   Lymphs Abs 1.5 0.7 - 4.0 K/uL   Monocytes Absolute 0.7 0.1 - 1.0 K/uL   Eosinophils Absolute  0.0 0.0 - 0.7 K/uL   Basophils Absolute 0.0 0.0 - 0.1 K/uL   RBC Morphology SICKLE CELLS   Reticulocytes   Collection Time: 09/18/14 11:30 AM  Result Value  Ref Range   Retic Ct Pct 15.6 (H) 0.4 - 3.1 %   RBC. 2.97 (L) 3.87 - 5.11 MIL/uL   Retic Count, Manual 463.3 (H) 19.0 - 186.0 K/uL  Comprehensive metabolic panel   Collection Time: 09/18/14 11:30 AM  Result Value Ref Range   Sodium 135 135 - 145 mmol/L   Potassium 3.7 3.5 - 5.1 mmol/L   Chloride 109 101 - 111 mmol/L   CO2 22 22 - 32 mmol/L   Glucose, Bld 92 70 - 99 mg/dL   BUN <5 (L) 6 - 20 mg/dL   Creatinine, Ser 0.38 (L) 0.44 - 1.00 mg/dL   Calcium 8.5 (L) 8.9 - 10.3 mg/dL   Total Protein 6.7 6.5 - 8.1 g/dL   Albumin 3.3 (L) 3.5 - 5.0 g/dL   AST 21 15 - 41 U/L   ALT 18 14 - 54 U/L   Alkaline Phosphatase 51 38 - 126 U/L   Total Bilirubin 1.3 (H) 0.3 - 1.2 mg/dL   GFR calc non Af Amer >60 >60 mL/min   GFR calc Af Amer >60 >60 mL/min   Anion gap 4 (L) 5 - 15  Fetal fibronectin   Collection Time: 09/18/14  1:40 PM  Result Value Ref Range   Fetal Fibronectin NEGATIVE NEGATIVE    Imaging Studies: US Ob Follow Up  09/17/2014   OBSTETRICAL ULTRASOUND: This exam was performed within a [facility]. The OB US report was generated in the [facility] system, and faxed to the ordering physician.   This report is available in the [facility]. See the [facility] Obstetric US report via the Image Link.  Ir Fluoro Guide Cv Line Left  09/04/2014   CLINICAL DATA:  Sickle cell disease, pregnant, needs IV access  EXAM: PICC PLACEMENT WITH ULTRASOUND AND FLUOROSCOPY  FLUOROSCOPY TIME:  0.1 minutes, 0.4 mGy  TECHNIQUE: After written informed consent was obtained, patient was placed in the supine position on angiographic table. Patency of the left brachial vein was confirmed with ultrasound with image documentation. An appropriate skin site was determined. Skin site was marked. Region was prepped using maximum barrier technique including cap  and mask, sterile gown, sterile gloves, large sterile sheet, and Chlorhexidine as cutaneous antisepsis. The region was infiltrated locally with 1% lidocaine. Under real-time ultrasound guidance, the left brachial vein was accessed with a 21 gauge micropuncture needle; the needle tip within the vein was confirmed with ultrasound image documentation. Needle exchanged over a 018 guidewire for a peel-away sheath, through which a 5-French double-lumen power injectable PICC trimmed to 38cm was advanced, positioned with its tip near the cavoatrial junction. Spot chest radiograph confirms appropriate catheter position. Catheter was flushed per protocol and secured externally. The patient tolerated procedure well.  COMPLICATIONS: COMPLICATIONS none  IMPRESSION: 1. Technically successful five [place] double lumen power injectable PICC placement   Electronically Signed   By: Lucrezia Europe M.D.   On: 09/04/2014 12:42   Ir US Guide Vasc Access Left  09/04/2014   CLINICAL DATA:  Sickle cell disease, pregnant, needs IV access  EXAM: PICC PLACEMENT WITH ULTRASOUND AND FLUOROSCOPY  FLUOROSCOPY TIME:  0.1 minutes, 0.4 mGy  TECHNIQUE: After written informed consent was obtained, patient was placed in the supine position on angiographic table. Patency of the left brachial vein was confirmed with ultrasound with image documentation. An appropriate skin site was determined. Skin site was marked. Region was prepped using maximum barrier technique including cap and mask, sterile gown, sterile gloves, large sterile  sheet, and Chlorhexidine as cutaneous antisepsis. The region was infiltrated locally with 1% lidocaine. Under real-time ultrasound guidance, the left brachial vein was accessed with a 21 gauge micropuncture needle; the needle tip within the vein was confirmed with ultrasound image documentation. Needle exchanged over a 018 guidewire for a peel-away sheath, through which a 5-French double-lumen power injectable PICC trimmed to 38cm  was advanced, positioned with its tip near the cavoatrial junction. Spot chest radiograph confirms appropriate catheter position. Catheter was flushed per protocol and secured externally. The patient tolerated procedure well.  COMPLICATIONS: COMPLICATIONS none  IMPRESSION: 1. Technically successful five [place] double lumen power injectable PICC placement   Electronically Signed   By: Lucrezia Europe M.D.   On: 09/04/2014 12:42     Prenatal Transfer Tool  Maternal Diabetes: No Genetic Screening: Normal Maternal Ultrasounds/Referrals: Normal Fetal Ultrasounds or other Referrals:  None Maternal Substance Abuse:  No Significant Maternal Medications:  Meds include: Other:  Methadone; oxycontin Significant Maternal Lab Results: Lab values include: Other: anemia   Assessment and Plan: Patient Active Problem List   Diagnosis Date Noted  . Boil of vulva 09/09/2014  . Sickle cell crisis 09/07/2014  . 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 Supervision of high-risk pregnancy 06/11/2014  . Chronic pain 02/26/2013  . Anemia of chronic disease 10/05/2012  . 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 Mood swings 11/08/2011  . Migraines 11/08/2011  . Overweight(278.02) 05/24/2011  . GERD (gastroesophageal reflux disease) 02/17/2011  . Depression 01/06/2011  . Back pain 09/17/2010  . Sickle cell disease 01/08/2009  . 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 [name], M.D., Cherlynn June

## 2014-09-19 DIAGNOSIS — O99612 Diseases of the digestive system complicating pregnancy, second trimester: Secondary | ICD-10-CM | POA: Diagnosis not present

## 2014-09-19 DIAGNOSIS — D5700 Hb-SS disease with crisis, unspecified: Secondary | ICD-10-CM | POA: Diagnosis not present

## 2014-09-19 DIAGNOSIS — O99412 Diseases of the circulatory system complicating pregnancy, second trimester: Secondary | ICD-10-CM | POA: Diagnosis not present

## 2014-09-19 DIAGNOSIS — Z91018 Allergy to other foods: Secondary | ICD-10-CM | POA: Diagnosis not present

## 2014-09-19 DIAGNOSIS — O9989 Other specified diseases and conditions complicating pregnancy, childbirth and the puerperium: Secondary | ICD-10-CM | POA: Diagnosis present

## 2014-09-19 DIAGNOSIS — Z87891 Personal history of nicotine dependence: Secondary | ICD-10-CM | POA: Diagnosis not present

## 2014-09-19 DIAGNOSIS — R509 Fever, unspecified: Secondary | ICD-10-CM | POA: Diagnosis not present

## 2014-09-19 DIAGNOSIS — D72829 Elevated white blood cell count, unspecified: Secondary | ICD-10-CM | POA: Diagnosis not present

## 2014-09-19 DIAGNOSIS — O09292 Supervision of pregnancy with other poor reproductive or obstetric history, second trimester: Secondary | ICD-10-CM | POA: Diagnosis not present

## 2014-09-19 DIAGNOSIS — Z79899 Other long term (current) drug therapy: Secondary | ICD-10-CM | POA: Diagnosis not present

## 2014-09-19 DIAGNOSIS — O99112 Other diseases of the blood and blood-forming organs and certain disorders involving the immune mechanism complicating pregnancy, second trimester: Secondary | ICD-10-CM | POA: Diagnosis not present

## 2014-09-19 DIAGNOSIS — O99012 Anemia complicating pregnancy, second trimester: Secondary | ICD-10-CM | POA: Diagnosis not present

## 2014-09-19 DIAGNOSIS — I959 Hypotension, unspecified: Secondary | ICD-10-CM | POA: Diagnosis not present

## 2014-09-19 DIAGNOSIS — Z3A26 26 weeks gestation of pregnancy: Secondary | ICD-10-CM | POA: Diagnosis present

## 2014-09-19 DIAGNOSIS — Z79891 Long term (current) use of opiate analgesic: Secondary | ICD-10-CM | POA: Diagnosis not present

## 2014-09-19 DIAGNOSIS — K5909 Other constipation: Secondary | ICD-10-CM | POA: Diagnosis not present

## 2014-09-19 DIAGNOSIS — O0992 Supervision of high risk pregnancy, unspecified, second trimester: Secondary | ICD-10-CM | POA: Diagnosis not present

## 2014-09-19 DIAGNOSIS — Z9049 Acquired absence of other specified parts of digestive tract: Secondary | ICD-10-CM | POA: Diagnosis present

## 2014-09-19 DIAGNOSIS — G8929 Other chronic pain: Secondary | ICD-10-CM | POA: Diagnosis not present

## 2014-09-19 DIAGNOSIS — Z91040 Latex allergy status: Secondary | ICD-10-CM | POA: Diagnosis not present

## 2014-09-19 DIAGNOSIS — T402X5A Adverse effect of other opioids, initial encounter: Secondary | ICD-10-CM | POA: Diagnosis present

## 2014-09-19 LAB — CBC WITH DIFFERENTIAL/PLATELET
BASOS ABS: 0 10*3/uL (ref 0.0–0.1)
Basophils Relative: 0 % (ref 0–1)
EOS ABS: 0 10*3/uL (ref 0.0–0.7)
Eosinophils Relative: 0 % (ref 0–5)
HCT: 22 % — ABNORMAL LOW (ref 36.0–46.0)
Hemoglobin: 7.8 g/dL — ABNORMAL LOW (ref 12.0–15.0)
LYMPHS ABS: 0.8 10*3/uL (ref 0.7–4.0)
Lymphocytes Relative: 4 % — ABNORMAL LOW (ref 12–46)
MCH: 30.2 pg (ref 26.0–34.0)
MCHC: 35.5 g/dL (ref 30.0–36.0)
MCV: 85.3 fL (ref 78.0–100.0)
Monocytes Absolute: 1.7 10*3/uL — ABNORMAL HIGH (ref 0.1–1.0)
Monocytes Relative: 8 % (ref 3–12)
NEUTROS PCT: 88 % — AB (ref 43–77)
Neutro Abs: 18.2 10*3/uL — ABNORMAL HIGH (ref 1.7–7.7)
PLATELETS: 280 10*3/uL (ref 150–400)
RBC: 2.58 MIL/uL — AB (ref 3.87–5.11)
RDW: 16.7 % — AB (ref 11.5–15.5)
WBC: 20.7 10*3/uL — ABNORMAL HIGH (ref 4.0–10.5)

## 2014-09-19 LAB — COMPREHENSIVE METABOLIC PANEL
ALBUMIN: 3.2 g/dL — AB (ref 3.5–5.0)
ALK PHOS: 53 U/L (ref 38–126)
ALT: 17 U/L (ref 14–54)
ANION GAP: 3 — AB (ref 5–15)
AST: 23 U/L (ref 15–41)
CALCIUM: 8 mg/dL — AB (ref 8.9–10.3)
CO2: 22 mmol/L (ref 22–32)
CREATININE: 0.45 mg/dL (ref 0.44–1.00)
Chloride: 108 mmol/L (ref 101–111)
GFR calc non Af Amer: >60 mL/min (ref >60)
GLUCOSE: 105 mg/dL — AB (ref 70–99)
Potassium: 3.5 mmol/L (ref 3.5–5.1)
Sodium: 133 mmol/L — ABNORMAL LOW (ref 135–145)
TOTAL PROTEIN: 6.2 g/dL — AB (ref 6.5–8.1)
Total Bilirubin: 2.4 mg/dL — ABNORMAL HIGH (ref 0.3–1.2)

## 2014-09-19 LAB — CULTURE, OB URINE: Colony Count: 40000

## 2014-09-19 LAB — MAGNESIUM: MAGNESIUM: 1.5 mg/dL — AB (ref 1.7–2.4)

## 2014-09-19 LAB — LACTATE DEHYDROGENASE: LDH: 252 U/L — AB (ref 98–192)

## 2014-09-19 MED ORDER — HYDROMORPHONE HCL 2 MG/ML IJ SOLN: 2.0000 mg | Freq: Once | INTRAMUSCULAR | Status: AC

## 2014-09-19 MED ORDER — BUTALBITAL-APAP-CAFFEINE 50-325-40 MG PO TABS: 2.0000 | ORAL_TABLET | Freq: Four times a day (QID) | ORAL | Status: DC | PRN

## 2014-09-19 MED ORDER — HYDROMORPHONE HCL 2 MG/ML IJ SOLN
2.0000 mg | INTRAMUSCULAR | Status: DC | PRN
  Administered 2014-09-19 – 2014-09-20 (×5): 2 mg via INTRAVENOUS
  Filled 2014-09-19 (×5): qty 1

## 2014-09-19 NOTE — Progress Notes (Signed)
Attempted visit two times but patient was on phone and later asleep.    Please page if we can be a support to her.  [employer] Pager, [phone] 3:05 PM    09/19/14 1500  Clinical Encounter Type  Visited With Patient not available

## 2014-09-19 NOTE — Consult Note (Signed)
SICKLE CELL SERVICE CONSULT NOTE  [name] QQI:[id number] DOB: 1990/11/15 DOA: 09/18/2014 PCP: [name]., MD  Consulting Service: OB/Gyn Consulting Physician: Dr. Lavonia Drafts  Presenting HPI: [name] is a 24 y.o. G2P0010 at [redacted] weeks gestation who presented with back and buttock pain. Pt states that her pain started yesterday. She feels that it has been very difficulty to control her pain since being pregnant. Currently, she rates her pain as a 9/10. She feels that her current pain management is helping. She is nauseated and has had emesis. She also reports a poor appetite and migraine HA.  Denies SOB, diarrhea, CP, vision changes, and dizziness.   Objective: Filed Vitals:   09/19/14 1220 09/19/14 1225 09/19/14 1300 09/19/14 1520  BP:   124/79   Pulse: 25 112 126   Temp:   99.2 F (37.3 C)   TempSrc:   Oral   Resp:   24 18  Height:      Weight:      SpO2: 80% 86%  91%   Weight change:  No intake or output data in the 24 hours ending 09/19/14 1615  General: Alert, awake, oriented x3, in no acute distress.  HEENT: [place]/AT PEERL, EOMI Neck: Trachea midline,  no masses OROPHARYNX:  Moist, No exudate/ erythema/lesions.  Heart: Regular rate and rhythm, without murmurs, rubs, gallops Lungs: Clear to auscultation, no wheezing or rhonchi noted. Abdomen: Soft, nontender, nondistended, positive bowel sounds, gravid Neuro: No focal neurological deficits noted cranial nerves II through XII grossly intact. Strength 5 out of 5 in bilateral upper and lower extremities. Musculoskeletal: No warmth, swelling, or erythema around joints, no spinal tenderness noted. Lymph node survey: No cervical lymphadenopathy noted.   Data Reviewed: Basic Metabolic Panel:  Recent Labs Lab 09/18/14 1130 09/19/14 0620  NA 135 133*  K 3.7 3.5  CL 109 108  CO2 22 22  GLUCOSE 92 105*  BUN <5* <5*  CREATININE 0.38* 0.45  CALCIUM 8.5* 8.0*  MG  --  1.5*   Liver  Function Tests:  Recent Labs Lab 09/18/14 1130 09/19/14 0620  AST 21 23  ALT 18 17  ALKPHOS 51 53  BILITOT 1.3* 2.4*  PROT 6.7 6.2*  ALBUMIN 3.3* 3.2*   No results for input(s): LIPASE, AMYLASE in the last 168 hours. No results for input(s): AMMONIA in the last 168 hours. CBC:  Recent Labs Lab 09/18/14 1130 09/19/14 0620  WBC 14.9* 20.7*  NEUTROABS 12.7* 18.2*  HGB 9.2* 7.8*  HCT 25.7* 22.0*  MCV 86.5 85.3  PLT 328 280   Cardiac Enzymes: No results for input(s): CKTOTAL, CKMB, CKMBINDEX, TROPONINI in the last 168 hours. BNP (last 3 results) No results for input(s): PROBNP in the last 8760 hours. CBG: No results for input(s): GLUCAP in the last 168 hours.  No results found for this or any previous visit (from the past 240 hour(s)).   Studies: US Ob Follow Up  09/17/2014   OBSTETRICAL ULTRASOUND: This exam was performed within a [facility]. The OB US report was generated in the AS system, and faxed to the ordering physician.   This report is available in the [facility]. See the AS Obstetric US report via the Image Link.  Ir Fluoro Guide Cv Line Left  09/04/2014   CLINICAL DATA:  Sickle cell disease, pregnant, needs IV access  EXAM: PICC PLACEMENT WITH ULTRASOUND AND FLUOROSCOPY  FLUOROSCOPY TIME:  0.1 minutes, 0.4 mGy  TECHNIQUE: After written informed consent  was obtained, patient was placed in the supine position on angiographic table. Patency of the left brachial vein was confirmed with ultrasound with image documentation. An appropriate skin site was determined. Skin site was marked. Region was prepped using maximum barrier technique including cap and mask, sterile gown, sterile gloves, large sterile sheet, and Chlorhexidine as cutaneous antisepsis. The region was infiltrated locally with 1% lidocaine. Under real-time ultrasound guidance, the left brachial vein was accessed with a 21 gauge micropuncture needle; the needle tip within the vein was  confirmed with ultrasound image documentation. Needle exchanged over a 018 guidewire for a peel-away sheath, through which a 5-French double-lumen power injectable PICC trimmed to 38cm was advanced, positioned with its tip near the cavoatrial junction. Spot chest radiograph confirms appropriate catheter position. Catheter was flushed per protocol and secured externally. The patient tolerated procedure well.  COMPLICATIONS: COMPLICATIONS none  IMPRESSION: 1. Technically successful five [place] double lumen power injectable PICC placement   Electronically Signed   By: Lucrezia Europe M.D.   On: 09/04/2014 12:42   Ir US Guide Vasc Access Left  09/04/2014   CLINICAL DATA:  Sickle cell disease, pregnant, needs IV access  EXAM: PICC PLACEMENT WITH ULTRASOUND AND FLUOROSCOPY  FLUOROSCOPY TIME:  0.1 minutes, 0.4 mGy  TECHNIQUE: After written informed consent was obtained, patient was placed in the supine position on angiographic table. Patency of the left brachial vein was confirmed with ultrasound with image documentation. An appropriate skin site was determined. Skin site was marked. Region was prepped using maximum barrier technique including cap and mask, sterile gown, sterile gloves, large sterile sheet, and Chlorhexidine as cutaneous antisepsis. The region was infiltrated locally with 1% lidocaine. Under real-time ultrasound guidance, the left brachial vein was accessed with a 21 gauge micropuncture needle; the needle tip within the vein was confirmed with ultrasound image documentation. Needle exchanged over a 018 guidewire for a peel-away sheath, through which a 5-French double-lumen power injectable PICC trimmed to 38cm was advanced, positioned with its tip near the cavoatrial junction. Spot chest radiograph confirms appropriate catheter position. Catheter was flushed per protocol and secured externally. The patient tolerated procedure well.  COMPLICATIONS: COMPLICATIONS none  IMPRESSION: 1. Technically successful  five [place] double lumen power injectable PICC placement   Electronically Signed   By: Lucrezia Europe M.D.   On: 09/04/2014 12:42    Scheduled Meds: . enoxaparin (LOVENOX) injection  40 mg Subcutaneous Q24H  . famotidine  20 mg Oral Q12H   Or  . famotidine (PEPCID) IV  20 mg Intravenous Q12H  . folic acid  2 mg Oral Daily  . HYDROmorphone PCA 0.3 mg/mL   Intravenous 6 times per day  . methadone  5 mg Oral QHS  . prenatal vitamin w/FE, FA  1 tablet Oral Q1200  . senna-docusate  1 tablet Oral BID   Continuous Infusions: . dextrose 5 % and 0.45% NaCl 250 mL/hr at 09/19/14 0727  . sodium chloride 0.9 % 1,000 mL infusion 165 mL/hr at 09/19/14 1401    Active Problems:   Sickle cell crisis   Assessment/Plan: [name] is a 24yo with sickle-cell disease who is currently [redacted] weeks pregnant who initially presented to the ER with symptoms consistent with vaso-occlusive crisis. She is currently admitted to Ob/gyn for further pain management and monitoring of her pregnancy.  Recommendations: 1. Sickle Cell Crisis: Opiod tolerant patient. Continue her custom dose Dilaudid PCA at 0.7mg  q 10 min with a lockout at4.2mg  per hour. Continue home Methadone 5mg   QHS for long acting control. Start Dilaudid 2mg  IV q3h PRN for breakthrough and discontinue scheduled dose. We will continue to follow and assess pain management needs.Switch IV fluids from D5/0.45% to NS as pt is hyponatremic. Recheck BMP in AM.  2. Constipation: Continue bowel regimen as constipation is an adverse effect due to opioid use. Give Miralax if she doesn't have a bowel movement in the next 1-2 days.  3. Sickle Cell Care: Continue Folic acid 4. Anemia: Hgb down-trended to 7.8 from 9.2. Please transfuse 1 unit PRBC if Hgb goes below 7.5. Check CBC in AM. Transfusion may help with acute crisis.   Thank you for this consult. Please feel free to call us with any questions or concerns.   [place], [place]

## 2014-09-19 NOTE — Progress Notes (Signed)
09/18/14 @ 1958: Pt refused to allow blood pressures to be obtained during start of PCA.

## 2014-09-19 NOTE — Progress Notes (Signed)
Dr Alben Deeds at bedside. Encouraged her to use PCA.

## 2014-09-19 NOTE — Progress Notes (Addendum)
Patient ID: [name], female   DOB: [birth year], 24 y.o.   MRN: [id number] [place] COMPREHENSIVE PROGRESS NOTE  [name] is a 24 y.o. G2P0010 at [redacted]w[redacted]d  who is admitted for sickle cell crises.   Fetal presentation is breech. Length of Stay:    Days  Subjective: Pt c/o a HA.  She reports that Fioricet helps with her migraine HA's.  She reports that the Dilaudid is helping with the pain but, it wears off quickly.  She wants an increase in her meds,  Pt reported that she was taking the oxycontin last evening with the dilaudid (the nurse reported that she refused the oxycontin).  Pt expressed to the nurses that she felt alone and did not have a support system. Patient reports good fetal movement.  She reports + uterine contractions, no bleeding and no loss of fluid per vagina.  Vitals:  Blood pressure 128/93, pulse 126, temperature 99.5 F (37.5 C), temperature source Oral, resp. rate 15, height 5\' 2"  (1.575 m), weight 166 lb (75.297 kg), last menstrual period 03/20/2014, SpO2 86 %. Physical Examination: General appearance - in mild to moderate distress Chest - clear to auscultation, no wheezes, rales or rhonchi, symmetric air entry Heart - normal rate, regular rhythm, normal S1, S2, no murmurs, rubs, clicks or gallops Abdomen - soft, nontender, nondistended, no masses or organomegaly gravid Back exam - pain with motion noted during exam Cervical Exam: Not evaluated.  Membranes:intact  Fetal Monitoring:  Baseline: 140's bpm, Variability: Fair (1-6 bpm), Accelerations: Non-reactive but appropriate for gestational age and Decelerations: Absent  Labs:  Results for orders placed or performed during the hospital encounter of 09/18/14 (from the past 24 hour(s))  CBC with Differential/Platelet   Collection Time: 09/18/14 11:30 AM  Result Value Ref Range   WBC 14.9 (H) 4.0 - 10.5 K/uL   RBC 2.97 (L) 3.87 - 5.11 MIL/uL   Hemoglobin 9.2 (L) 12.0 - 15.0 g/dL   HCT  25.7 (L) 36.0 - 46.0 %   MCV 86.5 78.0 - 100.0 fL   MCH 31.0 26.0 - 34.0 pg   MCHC 35.8 30.0 - 36.0 g/dL   RDW 16.7 (H) 11.5 - 15.5 %   Platelets 328 150 - 400 K/uL   Neutrophils Relative % 85 (H) 43 - 77 %   Lymphocytes Relative 10 (L) 12 - 46 %   Monocytes Relative 5 3 - 12 %   Eosinophils Relative 0 0 - 5 %   Basophils Relative 0 0 - 1 %   Neutro Abs 12.7 (H) 1.7 - 7.7 K/uL   Lymphs Abs 1.5 0.7 - 4.0 K/uL   Monocytes Absolute 0.7 0.1 - 1.0 K/uL   Eosinophils Absolute 0.0 0.0 - 0.7 K/uL   Basophils Absolute 0.0 0.0 - 0.1 K/uL   RBC Morphology SICKLE CELLS   Reticulocytes   Collection Time: 09/18/14 11:30 AM  Result Value Ref Range   Retic Ct Pct 15.6 (H) 0.4 - 3.1 %   RBC. 2.97 (L) 3.87 - 5.11 MIL/uL   Retic Count, Manual 463.3 (H) 19.0 - 186.0 K/uL  Comprehensive metabolic panel   Collection Time: 09/18/14 11:30 AM  Result Value Ref Range   Sodium 135 135 - 145 mmol/L   Potassium 3.7 3.5 - 5.1 mmol/L   Chloride 109 101 - 111 mmol/L   CO2 22 22 - 32 mmol/L   Glucose, Bld 92 70 - 99 mg/dL   BUN <5 (L) 6 - 20 mg/dL  Creatinine, Ser 0.38 (L) 0.44 - 1.00 mg/dL   Calcium 8.5 (L) 8.9 - 10.3 mg/dL   Total Protein 6.7 6.5 - 8.1 g/dL   Albumin 3.3 (L) 3.5 - 5.0 g/dL   AST 21 15 - 41 U/L   ALT 18 14 - 54 U/L   Alkaline Phosphatase 51 38 - 126 U/L   Total Bilirubin 1.3 (H) 0.3 - 1.2 mg/dL   GFR calc non Af Amer >60 >60 mL/min   GFR calc Af Amer >60 >60 mL/min   Anion gap 4 (L) 5 - 15  Fetal fibronectin   Collection Time: 09/18/14  1:40 PM  Result Value Ref Range   Fetal Fibronectin NEGATIVE NEGATIVE  Urinalysis, Routine w reflex microscopic   Collection Time: 09/18/14  4:20 PM  Result Value Ref Range   Color, Urine YELLOW YELLOW   APPearance HAZY (A) CLEAR   Specific Gravity, Urine 1.020 1.005 - 1.030   pH 6.0 5.0 - 8.0   Glucose, UA NEGATIVE NEGATIVE mg/dL   Hgb urine dipstick NEGATIVE NEGATIVE   Bilirubin Urine NEGATIVE NEGATIVE   Ketones, ur NEGATIVE NEGATIVE mg/dL    Protein, ur NEGATIVE NEGATIVE mg/dL   Urobilinogen, UA 0.2 0.0 - 1.0 mg/dL   Nitrite NEGATIVE NEGATIVE   Leukocytes, UA TRACE (A) NEGATIVE  Urine microscopic-add on   Collection Time: 09/18/14  4:20 PM  Result Value Ref Range   Squamous Epithelial / LPF FEW (A) RARE   WBC, UA 0-2 <3 WBC/hpf    Imaging Studies:    35th%ile F/u in 4 weeks for growth    Medications:  Scheduled . enoxaparin (LOVENOX) injection  40 mg Subcutaneous Q24H  . famotidine  20 mg Oral Q12H   Or  . famotidine (PEPCID) IV  20 mg Intravenous Q12H  . folic acid  2 mg Oral Daily  .  HYDROmorphone (DILAUDID) injection  2 mg Intravenous Q4H  . HYDROmorphone PCA 0.3 mg/mL   Intravenous 6 times per day  . methadone  5 mg Oral QHS  . prenatal vitamin w/FE, FA  1 tablet Oral Q1200  . senna-docusate  1 tablet Oral BID   I have reviewed the patient's current medications.  ASSESSMENT: Patient Active Problem List   Diagnosis Date Noted  . Boil of vulva 09/09/2014  . Sickle cell crisis 09/07/2014  . 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 Supervision of high-risk pregnancy 06/11/2014  . Chronic pain 02/26/2013  . Anemia of chronic disease 10/05/2012  . 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 Mood swings 11/08/2011  . Migraines 11/08/2011  . Overweight(278.02) 05/24/2011  . GERD (gastroesophageal reflux disease) 02/17/2011  . Depression 01/06/2011  . Back pain 09/17/2010  . Sickle cell disease 01/08/2009  . 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  These doses are not unusual for sickle cell crises so the team will attempt to contact Dr. Rodena Piety for rec on pain management beyond what we are giving currently. IV and po fluid hydration Continue routine antenatal care. Social work consult Pastoral care consult   [name], Miloh Alcocer 09/19/2014,6:55 AM

## 2014-09-19 NOTE — Progress Notes (Signed)
CSW acknowledges consult.  CSW spoke with Chaplain/K. Claussen who states she spent some time with patient this morning.  CSW attempted to meet with patient, but she was sleeping at this time.  CSW spoke with bedside RN and asked that CSW be called if patient would like to speak with CSW.  CSW will also check in at a later time.

## 2014-09-20 ENCOUNTER — Inpatient Hospital Stay (HOSPITAL_COMMUNITY): Payer: Medicare Other

## 2014-09-20 DIAGNOSIS — G8929 Other chronic pain: Secondary | ICD-10-CM

## 2014-09-20 DIAGNOSIS — D5700 Hb-SS disease with crisis, unspecified: Secondary | ICD-10-CM

## 2014-09-20 DIAGNOSIS — O0992 Supervision of high risk pregnancy, unspecified, second trimester: Secondary | ICD-10-CM

## 2014-09-20 LAB — LACTIC ACID, PLASMA: Lactic Acid, Venous: 0.7 mmol/L (ref 0.5–2.0)

## 2014-09-20 LAB — PREPARE RBC (CROSSMATCH)

## 2014-09-20 LAB — BASIC METABOLIC PANEL
ANION GAP: 4 — AB (ref 5–15)
BUN: <5 mg/dL — ABNORMAL LOW (ref 6–20)
CALCIUM: 8 mg/dL — AB (ref 8.9–10.3)
CO2: 23 mmol/L (ref 22–32)
Chloride: 110 mmol/L (ref 101–111)
Creatinine, Ser: 0.47 mg/dL (ref 0.44–1.00)
GFR calc Af Amer: >60 mL/min (ref >60)
Glucose, Bld: 94 mg/dL (ref 70–99)
Potassium: 3.6 mmol/L (ref 3.5–5.1)
SODIUM: 137 mmol/L (ref 135–145)

## 2014-09-20 LAB — CBC WITH DIFFERENTIAL/PLATELET
BASOS ABS: 0.1 10*3/uL (ref 0.0–0.1)
BASOS PCT: 0 % (ref 0–1)
EOS ABS: 0.1 10*3/uL (ref 0.0–0.7)
EOS PCT: 1 % (ref 0–5)
HEMATOCRIT: 19 % — AB (ref 36.0–46.0)
Hemoglobin: 6.9 g/dL — CL (ref 12.0–15.0)
Lymphocytes Relative: 9 % — ABNORMAL LOW (ref 12–46)
Lymphs Abs: 1.3 10*3/uL (ref 0.7–4.0)
MCH: 31.1 pg (ref 26.0–34.0)
MCHC: 36.3 g/dL — ABNORMAL HIGH (ref 30.0–36.0)
MCV: 85.6 fL (ref 78.0–100.0)
MONO ABS: 1.3 10*3/uL — AB (ref 0.1–1.0)
Monocytes Relative: 9 % (ref 3–12)
NEUTROS ABS: 12.4 10*3/uL — AB (ref 1.7–7.7)
Neutrophils Relative %: 82 % — ABNORMAL HIGH (ref 43–77)
Platelets: 250 10*3/uL (ref 150–400)
RBC: 2.22 MIL/uL — AB (ref 3.87–5.11)
RDW: 17.3 % — AB (ref 11.5–15.5)
WBC: 15.1 10*3/uL — AB (ref 4.0–10.5)

## 2014-09-20 LAB — CBC
HCT: 20.2 % — ABNORMAL LOW (ref 36.0–46.0)
Hemoglobin: 7.2 g/dL — ABNORMAL LOW (ref 12.0–15.0)
MCH: 30.6 pg (ref 26.0–34.0)
MCHC: 35.6 g/dL (ref 30.0–36.0)
MCV: 86 fL (ref 78.0–100.0)
PLATELETS: 277 10*3/uL (ref 150–400)
RBC: 2.35 MIL/uL — ABNORMAL LOW (ref 3.87–5.11)
RDW: 17.7 % — AB (ref 11.5–15.5)
WBC: 26.9 10*3/uL — AB (ref 4.0–10.5)

## 2014-09-20 LAB — URINALYSIS, ROUTINE W REFLEX MICROSCOPIC
GLUCOSE, UA: NEGATIVE mg/dL
HGB URINE DIPSTICK: NEGATIVE
Ketones, ur: 40 mg/dL — AB
Leukocytes, UA: NEGATIVE
Nitrite: NEGATIVE
Protein, ur: NEGATIVE mg/dL
SPECIFIC GRAVITY, URINE: 1.02 (ref 1.005–1.030)
Urobilinogen, UA: 4 mg/dL — ABNORMAL HIGH (ref 0.0–1.0)
pH: 5.5 (ref 5.0–8.0)

## 2014-09-20 LAB — LACTATE DEHYDROGENASE: LDH: 281 U/L — ABNORMAL HIGH (ref 98–192)

## 2014-09-20 LAB — MRSA PCR SCREENING: MRSA BY PCR: NEGATIVE

## 2014-09-20 MED ORDER — SODIUM CHLORIDE 0.9 % IV BOLUS (SEPSIS)
1000.0000 mL | Freq: Once | INTRAVENOUS | Status: AC
  Administered 2014-09-20: 1000 mL via INTRAVENOUS

## 2014-09-20 MED ORDER — DIPHENHYDRAMINE HCL 25 MG PO CAPS
25.0000 mg | ORAL_CAPSULE | Freq: Once | ORAL | Status: AC
  Administered 2014-09-20: 25 mg via ORAL
  Filled 2014-09-20: qty 1

## 2014-09-20 MED ORDER — SODIUM CHLORIDE 0.9 % IV SOLN: Freq: Once | INTRAVENOUS | Status: AC

## 2014-09-20 MED ORDER — HYDROMORPHONE HCL 2 MG PO TABS
4.0000 mg | ORAL_TABLET | Freq: Once | ORAL | Status: AC
Start: 2014-09-20 — End: 2014-09-20
  Administered 2014-09-20: 4 mg via ORAL
  Filled 2014-09-20: qty 2

## 2014-09-20 MED ORDER — CEFTRIAXONE SODIUM IN DEXTROSE 40 MG/ML IV SOLN
2.0000 g | Freq: Two times a day (BID) | INTRAVENOUS | Status: DC
  Administered 2014-09-20 – 2014-09-23 (×7): 2 g via INTRAVENOUS
  Filled 2014-09-20 (×8): qty 50

## 2014-09-20 MED ORDER — VANCOMYCIN HCL IN DEXTROSE 1-5 GM/200ML-% IV SOLN
1000.0000 mg | Freq: Three times a day (TID) | INTRAVENOUS | Status: DC
  Administered 2014-09-20 – 2014-09-22 (×5): 1000 mg via INTRAVENOUS
  Filled 2014-09-20 (×6): qty 200

## 2014-09-20 MED ORDER — SODIUM CHLORIDE 0.9 % IJ SOLN
10.0000 mL | INTRAMUSCULAR | Status: DC | PRN
  Administered 2014-09-20 – 2014-09-26 (×3): 10 mL via INTRAVENOUS
  Filled 2014-09-20 (×11): qty 10

## 2014-09-20 MED ORDER — ACETAMINOPHEN 325 MG PO TABS
650.0000 mg | ORAL_TABLET | Freq: Once | ORAL | Status: AC
  Administered 2014-09-20: 650 mg via ORAL
  Filled 2014-09-20: qty 2

## 2014-09-20 MED ORDER — HYDROMORPHONE HCL 1 MG/ML IJ SOLN
2.0000 mg | INTRAMUSCULAR | Status: DC | PRN
  Administered 2014-09-20 – 2014-09-22 (×10): 2 mg via INTRAVENOUS
  Filled 2014-09-20 (×10): qty 2

## 2014-09-20 NOTE — Progress Notes (Signed)
When replacing Dilaudid PCA syringe at 1654, unable to program according to written orders of 0.7mg  per dose, 40min lock out, 4.2 mg/hr maximum dose; programmed to 0.5mg  per dose, 10 minute lock out time, 3mg /hr maximum dose. Dr. Glo Herring aware.   Between 1500-1830, red lumen in PICC line became occluded. Tried to flush with no success. CRNA, anesthesiologist, attending physician, and CCM aware. Blood is running in second lumen. Until blood administration is complete, pt will not be able to use PCA. Dr. Glo Herring ordered Dilaudid 4mg  orally.  7:43 PM 09/20/2014

## 2014-09-20 NOTE — Progress Notes (Signed)
Difficult to monitor baby d/t pt moving in bed d/t pain, pain med given during monitoring by [facility] nurse

## 2014-09-20 NOTE — Progress Notes (Signed)
[facility] Progress Note Patient Name: [name] DOB: 12-18-1990 MRN: [id number]   Date of Service  09/20/2014  HPI/Events of Note  Relative hypotension - BP = 100/58 (normal = 110's/70's. Critical Care Consult has been ordered, however, none of the PCCM physicians are aware of the consult.   eICU Interventions  Will order: 1. 0.9 NaCl 1 liter IV over 1 hour now.  2. Lactic Acid level now and Q 3 hours.      Intervention Category Major Interventions: Infection - evaluation and management  Sommer,Steven Eugene 09/20/2014, 3:40 PM

## 2014-09-20 NOTE — Progress Notes (Signed)
Patient ID: [name], female   DOB: [birth year], 24 y.o.   MRN: [id number] [place]) NOTE transfer note to AICU  [name] is a 24 y.o. G2P0010 at [redacted]w[redacted]d who is admitted for sickle Cell Crisis.   Fetal presentation is unsure. Length of Stay:  1  Days  Subjective: Called to see pt due to Fever to 101.4 without focal evidence of infection. She has been in Antenatal for sickle cell crisis, and has been requiring significant IV analgesics, and has been  sedated but arousable and responsive and appropriate response to questions. Mental status is less alert than when seen by same RN this morning:she is rather sedated. Patient has had blood cultures x 2 obtained, and CBC with Diff, both are pending. Patient is set up to receive 2 units prbc  due to anemia, the fever is noted before any transfusion. Due to complexity of care, and possibility of unidentified infection, will transfer to the AICU for further monitoring, and collaboration with intensivist. Discussed with Sickle Cell service, who's input is appreciated, and they agree with transfer. CXR to be ordered.  There is no  Change in fetal heart tracing , no uterine tenderness, no suspected membrane rupture. Vitals:  Blood pressure 88/42, pulse 120, temperature 101.9 F (38.8 C), temperature source Oral, resp. rate 23, height 5\' 2"  (1.575 m), weight 75.297 kg (166 lb), last menstrual period 03/20/2014, SpO2 86 %. Physical Examination:  General appearance - ill-appearing Heart - normal rate and regular rhythm,Tachycardic to  120 Abdomen - soft, nontender, nondistended Fundal Height:  size equals dates Cervical Exam: Not evaluated.  Extremities: extremities normal, atraumatic, no cyanosis or edema and Homans sign is negative, no sign of DVT Membranes:intact  Fetal Monitoring: monitoring has been benign to date. FHR 165 now  Labs:  Results for orders placed or performed during the hospital encounter of  09/18/14 (from the past 24 hour(s))  CBC with Differential/Platelet   Collection Time: 09/20/14  5:45 AM  Result Value Ref Range   WBC 15.1 (H) 4.0 - 10.5 K/uL   RBC 2.22 (L) 3.87 - 5.11 MIL/uL   Hemoglobin 6.9 (LL) 12.0 - 15.0 g/dL   HCT 19.0 (L) 36.0 - 46.0 %   MCV 85.6 78.0 - 100.0 fL   MCH 31.1 26.0 - 34.0 pg   MCHC 36.3 (H) 30.0 - 36.0 g/dL   RDW 17.3 (H) 11.5 - 15.5 %   Platelets 250 150 - 400 K/uL   Neutrophils Relative % 82 (H) 43 - 77 %   Neutro Abs 12.4 (H) 1.7 - 7.7 K/uL   Lymphocytes Relative 9 (L) 12 - 46 %   Lymphs Abs 1.3 0.7 - 4.0 K/uL   Monocytes Relative 9 3 - 12 %   Monocytes Absolute 1.3 (H) 0.1 - 1.0 K/uL   Eosinophils Relative 1 0 - 5 %   Eosinophils Absolute 0.1 0.0 - 0.7 K/uL   Basophils Relative 0 0 - 1 %   Basophils Absolute 0.1 0.0 - 0.1 K/uL  Basic metabolic panel   Collection Time: 09/20/14  5:45 AM  Result Value Ref Range   Sodium 137 135 - 145 mmol/L   Potassium 3.6 3.5 - 5.1 mmol/L   Chloride 110 101 - 111 mmol/L   CO2 23 22 - 32 mmol/L   Glucose, Bld 94 70 - 99 mg/dL   BUN <5 (L) 6 - 20 mg/dL   Creatinine, Ser 0.47 0.44 - 1.00 mg/dL   Calcium 8.0 (L)  8.9 - 10.3 mg/dL   GFR calc non Af Amer >60 >60 mL/min   GFR calc Af Amer >60 >60 mL/min   Anion gap 4 (L) 5 - 15  Type and screen   Collection Time: 09/20/14  5:45 AM  Result Value Ref Range   ABO/RH(D) B POS    Antibody Screen NEG    Sample Expiration 09/23/2014    Unit Number [id number]    Blood Component Type RED CELLS,LR    Unit division 00    Status of Unit ISSUED    Donor AG Type NEGATIVE FOR KELL ANTIGEN NEGATIVE FOR C ANTIGEN    Unit tag comment HEMOGLOBIN S NEGATIVE    Transfusion Status OK TO TRANSFUSE    Crossmatch Result Compatible   Prepare RBC   Collection Time: 09/20/14  7:44 AM  Result Value Ref Range   Order Confirmation ORDER PROCESSED BY BLOOD BANK   CBC   Collection Time: 09/20/14 12:15 PM  Result Value Ref Range   WBC PENDING 4.0 - 10.5 K/uL   RBC 2.35  (L) 3.87 - 5.11 MIL/uL   Hemoglobin 7.2 (L) 12.0 - 15.0 g/dL   HCT 20.2 (L) 36.0 - 46.0 %   MCV 86.0 78.0 - 100.0 fL   MCH 30.6 26.0 - 34.0 pg   MCHC 35.6 30.0 - 36.0 g/dL   RDW 17.7 (H) 11.5 - 15.5 %   Platelets PENDING 150 - 400 K/uL    Imaging Studies:   CXR PA to be ordered.    Medications:  Scheduled . sodium chloride   Intravenous Once  . cefTRIAXone (ROCEPHIN)  IV  2 g Intravenous Q12H  . enoxaparin (LOVENOX) injection  40 mg Subcutaneous Q24H  . famotidine  20 mg Oral Q12H   Or  . famotidine (PEPCID) IV  20 mg Intravenous Q12H  . folic acid  2 mg Oral Daily  . HYDROmorphone PCA 0.3 mg/mL   Intravenous 6 times per day  . methadone  5 mg Oral QHS  . prenatal vitamin w/FE, FA  1 tablet Oral Q1200  . senna-docusate  1 tablet Oral BID   I have reviewed the patient's current medications.  ASSESSMENT: Patient Active Problem List   Diagnosis Date Noted  . Supervision of high-risk pregnancy 06/11/2014    Priority: Low  . Boil of vulva 09/09/2014  . Sickle cell crisis 09/07/2014  . 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 Chronic pain 02/26/2013  . Anemia of chronic disease 10/05/2012  . 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 Mood swings 11/08/2011  . Migraines 11/08/2011  . Overweight(278.02) 05/24/2011  . GERD (gastroesophageal reflux disease) 02/17/2011  . Depression 01/06/2011  . Back pain 09/17/2010  . Sickle cell disease 01/08/2009  . 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 U/a , C&S Rocephin 2 g q 12 CXR portable to r/o acute chest syndrome TRANSFER TO AICU Consult to intensivist. Transfusion 2 units prbc.  Karolee Meloni V 09/20/2014,1:14 PM

## 2014-09-20 NOTE — Progress Notes (Signed)
[facility] Progress Note Patient Name: [name] DOB: 08-05-1990 MRN: [id number]   Date of Service  09/20/2014  HPI/Events of Note  BP = 95/55. Lactic Acid = 0.7.  eICU Interventions  Bolus with 0.9 NaCl IV over 1 hour now.      Intervention Category Intermediate Interventions: Hypotension - evaluation and management  Sommer,Steven Eugene 09/20/2014, 5:43 PM

## 2014-09-20 NOTE — Progress Notes (Signed)
At 2247 hrs, PCA Dilaudid was reprogrammed as per MD's order and verified by [facility] RN

## 2014-09-20 NOTE — Plan of Care (Addendum)
Dr Glo Herring notified of FHR tracing with tachycardia and IV bolus as intervention. Also, informed provider of maternal tachycardia, maternal temp of 101.5, PICC line, LOC of patient, and medications given. Orders requested and discussing POC following sepsis protocol.  Orders received to continue to transfuse patient, labs, and Antibiotics.

## 2014-09-20 NOTE — Progress Notes (Signed)
[place] Progress Note Patient Name: [name] DOB: 1990/07/05 MRN: [id number]   Date of Service  09/20/2014  HPI/Events of Note  Spoke with Dr. Glo Herring (OB-GYN). Patient has chronic vascular access with PICC line. Dr. Glo Herring is looking into getting the PICC line removed and replaced.  Vancomycin is OK from point of view of pregnancy.   eICU Interventions  Will order: 1. Vancomycin per Pharmacy Consult .      Intervention Category Major Interventions: Infection - evaluation and management  Sommer,Steven Eugene 09/20/2014, 7:22 PM

## 2014-09-20 NOTE — Progress Notes (Signed)
Patient ID: [name], female   DOB: 07-10-1990, 24 y.o.   MRN: [id number]  FACULTY PRACTICE ANTEPARTUM NOTE  [name] is a 24 y.o. G2P0010 at [redacted]w[redacted]d  who is admitted for Sickle cell crisis.   Fetal presentation is unsure. Length of Stay:  1  Days  Subjective:  Patient complains of increased pain in upper body and in lower back/buttocks that increased overnight.  Pain not well controlled with dilaudid.  Patient reports good fetal movement.  She reports no uterine contractions, no bleeding and no loss of fluid per vagina.  Vitals:  Blood pressure 100/58, pulse 111, temperature 98.5 F (36.9 C), temperature source Oral, resp. rate 14, height 5\' 2"  (1.575 m), weight 166 lb (75.297 kg), last menstrual period 03/20/2014, SpO2 84 %. Physical Examination:  General appearance - alert, well appearing, and in no distress Mental status - alert, oriented to person, place, and time Chest - clear to auscultation, no wheezes, rales or rhonchi, symmetric air entry Heart - normal rate, regular rhythm, normal S1, S2, no murmurs, rubs, clicks or gallops Abdomen - soft, nontender, nondistended, no masses or organomegaly Fundal Height:  size equals dates Extremities: extremities normal, atraumatic, no cyanosis or edema and Homans sign is negative, no sign of DVT Membranes:intact  Fetal Monitoring:  Baseline: 140 bpm, Variability: Good {> 6 bpm), Accelerations: appropriate for gestational age and Decelerations: Absent  Labs:  Results for orders placed or performed during the hospital encounter of 09/18/14 (from the past 24 hour(s))  CBC with Differential/Platelet   Collection Time: 09/20/14  5:45 AM  Result Value Ref Range   WBC 15.1 (H) 4.0 - 10.5 K/uL   RBC 2.22 (L) 3.87 - 5.11 MIL/uL   Hemoglobin 6.9 (LL) 12.0 - 15.0 g/dL   HCT 19.0 (L) 36.0 - 46.0 %   MCV 85.6 78.0 - 100.0 fL   MCH 31.1 26.0 - 34.0 pg   MCHC 36.3 (H) 30.0 - 36.0 g/dL   RDW 17.3 (H) 11.5 - 15.5 %   Platelets 250 150  - 400 K/uL   Neutrophils Relative % 82 (H) 43 - 77 %   Neutro Abs 12.4 (H) 1.7 - 7.7 K/uL   Lymphocytes Relative 9 (L) 12 - 46 %   Lymphs Abs 1.3 0.7 - 4.0 K/uL   Monocytes Relative 9 3 - 12 %   Monocytes Absolute 1.3 (H) 0.1 - 1.0 K/uL   Eosinophils Relative 1 0 - 5 %   Eosinophils Absolute 0.1 0.0 - 0.7 K/uL   Basophils Relative 0 0 - 1 %   Basophils Absolute 0.1 0.0 - 0.1 K/uL  Basic metabolic panel   Collection Time: 09/20/14  5:45 AM  Result Value Ref Range   Sodium 137 135 - 145 mmol/L   Potassium 3.6 3.5 - 5.1 mmol/L   Chloride 110 101 - 111 mmol/L   CO2 23 22 - 32 mmol/L   Glucose, Bld 94 70 - 99 mg/dL   BUN <5 (L) 6 - 20 mg/dL   Creatinine, Ser 0.47 0.44 - 1.00 mg/dL   Calcium 8.0 (L) 8.9 - 10.3 mg/dL   GFR calc non Af Amer >60 >60 mL/min   GFR calc Af Amer >60 >60 mL/min   Anion gap 4 (L) 5 - 15    Imaging Studies:    none   Medications:  Scheduled . sodium chloride   Intravenous Once  . acetaminophen  650 mg Oral Once  . diphenhydrAMINE  25 mg Oral Once  .  enoxaparin (LOVENOX) injection  40 mg Subcutaneous Q24H  . famotidine  20 mg Oral Q12H   Or  . famotidine (PEPCID) IV  20 mg Intravenous Q12H  . folic acid  2 mg Oral Daily  . HYDROmorphone PCA 0.3 mg/mL   Intravenous 6 times per day  . methadone  5 mg Oral QHS  . prenatal vitamin w/FE, FA  1 tablet Oral Q1200  . senna-docusate  1 tablet Oral BID   I have reviewed the patient's current medications.  ASSESSMENT: Patient Active Problem List   Diagnosis Date Noted  . Boil of vulva 09/09/2014  . Sickle cell crisis 09/07/2014  . 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 Supervision of high-risk pregnancy 06/11/2014  . Chronic pain 02/26/2013  . Anemia of chronic disease 10/05/2012  . 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 Mood swings 11/08/2011  . Migraines 11/08/2011  . Overweight(278.02) 05/24/2011  . GERD (gastroesophageal reflux disease) 02/17/2011  . Depression 01/06/2011  . Back pain  09/17/2010  . Sickle cell disease 01/08/2009  . 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  Sickle cell crisis 2.  High risk pregnancy 3.  Chronic pain  PLAN: Hg 6.9 this AM. Transfuse 1 unit FHT reassuring for gestational age. Continue PCA - patient opioid tolerant Continue routine antenatal care.   Tanna Savoy Nancy Veneziano, DO 09/20/2014,7:45 AM

## 2014-09-20 NOTE — Consult Note (Addendum)
Name: [name] MRN: [id number] DOB: [birth year]    ADMISSION DATE:  09/18/2014 CONSULTATION DATE:  09/20/14  REFERRING MD :  Dr. Glo Herring (OB-GYN)  CHIEF COMPLAINT:  Sickle Cell Flare  BRIEF PATIENT DESCRIPTION: 24yo AAF w/PMHx of Sickle cell disease presented with c/o back pain  SIGNIFICANT EVENTS : we are asked to see this patient for assistance with sepsis management in a pregnant patient with fever, tachycardia, and low blood pressure  STUDIES:  CXR from 5/7 was reviewed. No focal infiltrate noted.    HISTORY OF PRESENT ILLNESS:   Pt is a 24 y.o. G2P0010 at [redacted]w[redacted]d admitted for a sickle cell crisis, trigger is unknown at this time. Thought to be secondary to a labial sore, however, discussions with her attending physician reflect that this is most likely a chronic process. Since admission, pt has had a fever of 101.4  She has a PICC line that has been in for the last 3 weeks and has been caring for it at home being extremely careful. She has been receiving dressing changes at her clinic as scheduled. She has been protecting it when she showers.   Pt reports that she has had pain in her back for 24 hours. She took 2 doses of oxycontin 12 hours apart with no relief of sx. She reports that she feels that she needs to be admitted. She states that she has no other pain at present. She says that with the dilaudid her pain is 4/10 but, she does not feel that she can be controlled as an outpt. She has no fetal concerns.  PAST MEDICAL HISTORY :   has a past medical history of Miscarriage (03/22/2011); Depression (01/06/2011); GERD (gastroesophageal reflux disease) (02/17/2011); Trichotillomania; Blood transfusion; Sickle cell anemia with crisis; Exertional dyspnea; Migraines (11/08/11); Chronic back pain; Mood swings (11/08/11); and Sickle cell anemia.  has past surgical history that includes Cholecystectomy (05/2010) and Dilation and curettage of uterus (02/20/11). Prior to Admission  medications   Medication Sig Start Date End Date Taking? Authorizing Provider  folic acid (FOLVITE) 1 MG tablet Take 2 tablets (2 mg total) by mouth daily. 04/24/14  Yes Dorena Dew, FNP  methadone (DOLOPHINE) 5 MG tablet Take 1 tablet (5 mg total) by mouth at bedtime. 08/26/14  Yes [name], MD  oxyCODONE (ROXICODONE) 15 MG immediate release tablet Take 1 tablet (15 mg total) by mouth every 4 (four) hours as needed for pain. 09/08/14  Yes [name], MD  Prenatal Vit-Min-FA-Fish Oil ([employer] PRENATAL GUMMY) 0.4-113.5 MG CHEW Chew 2 each by mouth daily.   Yes Historical Provider, MD  acetaminophen (TYLENOL) 500 MG tablet Take 1,000 mg by mouth every 6 (six) hours as needed for moderate pain or headache.    Historical Provider, MD   Allergies  Allergen Reactions  . Carrot Oil Hives and Swelling  . Latex Rash    FAMILY HISTORY:  family history includes Alcoholism in an other family member; Depression in an other family member; Diabetes in her maternal grandfather, maternal grandmother, and paternal grandmother; Hypercholesterolemia in an other family member; Hypertension in an other family member; Migraines in an other family member; Sickle cell trait in her father and mother. SOCIAL HISTORY:  reports that she quit smoking about 17 months ago. Her smoking use included Cigarettes. She has a .25 pack-year smoking history. She has never used smokeless tobacco. She reports that she does not drink alcohol or use illicit drugs.  REVIEW OF SYSTEMS:   Constitutional: +  fever,  malaise/fatigue and diaphoresis. negative chills, weight loss, HENT: Negative for hearing loss, ear pain, nosebleeds, congestion, sore throat, neck pain, tinnitus and ear discharge.   Eyes: Negative for blurred vision, double vision, photophobia, pain, discharge and redness.  Respiratory: Negative for cough, hemoptysis, sputum production, shortness of breath, wheezing and stridor.   Cardiovascular: Negative for  chest pain, palpitations, orthopnea, claudication, leg swelling and PND.  Gastrointestinal: Negative for heartburn, + nausea, + vomiting, + abdominal pain, - diarrhea, + constipation, blood in stool and melena.  Genitourinary: Negative for dysuria, urgency, frequency, hematuria and flank pain.  Musculoskeletal: + myalgias, back pain, joint pain and - falls.  Skin: Negative for itching and rash.  Neurological: Negative for dizziness, tingling, tremors, sensory change, speech change, focal weakness, seizures, loss of consciousness, weakness and headaches.  Endo/Heme/Allergies: Negative for environmental allergies and polydipsia. Does not bruise/bleed easily.  SUBJECTIVE: pt states she is feeling a lot better. Pain is still persisting but it has improved.  VITAL SIGNS: Temp:  [98 F (36.7 C)-101.9 F (38.8 C)] 98.3 F (36.8 C) (05/07 1840) Pulse Rate:  [99-144] 100 (05/07 1845) Resp:  [10-32] 10 (05/07 1845) BP: (88-129)/(41-89) 107/71 mmHg (05/07 1845) SpO2:  [77 %-100 %] 100 % (05/07 1845) Weight:  [170 lb 1.6 oz (77.157 kg)] 170 lb 1.6 oz (77.157 kg) (05/07 1418)  PHYSICAL EXAMINATION: General: Alert, awake, oriented x3, in no acute distress. Currently afebrile on 4L [place] Neuro:  Mental status is intact, no neurologic deficits noted.  HEENT:  PERRL EOMI nose is permeable, midline septum, clear oropharynx, good dentition Cardiovascular:  s1s2 no MRG, + tachycardia, palpable pulses distally in 4 extremities no edema Lungs:  Clear to auscultation bilaterally, no wheezes or crackles Abdomen:  Slightly tender to palpation, gravida, positive bowel sounds Musculoskeletal:  Equal strength in 4 extremities, good tone and strength Skin:  No sores, lesions, pt has multiple tattoos    Recent Labs Lab 09/18/14 1130 09/19/14 0620 09/20/14 0545  NA 135 133* 137  K 3.7 3.5 3.6  CL 109 108 110  CO2 22 22 23   BUN <5* <5* <5*  CREATININE 0.38* 0.45 0.47  GLUCOSE 92 105* 94    Recent  Labs Lab 09/19/14 0620 09/20/14 0545 09/20/14 1215  HGB 7.8* 6.9* 7.2*  HCT 22.0* 19.0* 20.2*  WBC 20.7* 15.1* 26.9*  PLT 280 250 277   Dg Chest Port 1 View  09/20/2014   CLINICAL DATA:  Sickle cell crisis, back and buttock pain, nausea, vomiting, fever, [redacted] weeks pregnant  EXAM: PORTABLE CHEST - 1 VIEW  COMPARISON:  Portable exam 1439 hr compared to 08/19/2014  FINDINGS: LEFT arm PICC line with tip projecting over mid SVC new since previous exam.  Enlargement of cardiac silhouette with pulmonary vascular congestion.  Minimal central peribronchial thickening.  Lungs clear.  No pleural effusion or pneumothorax.  Patchy areas of sclerosis within the humeral heads bilaterally compatible with bone infarcts.  IMPRESSION: Enlargement of cardiac silhouette with pulmonary vascular congestion.  Minimal bronchitic changes without definite acute pulmonary infiltrates.   Electronically Signed   By: Lavonia Dana M.D.   On: 09/20/2014 15:01    ASSESSMENT: 1. Concern for Sepsis, potential PICC line as source vs. dehydration after several days of N/V & poor PO intake  A. Hypotension, resolved s/p 2L IVF  B. UA unremarkable. CXR with no focal infiltrate  C. Leukocytosis, tachycardia, fever    D. S/p rocephin  2. Sickle cell crisis, pain controlled with PCA and  PO medications 3. Sickle cell anemia, Hb currently 7.2 s/p 2U PRBC   4. Active pregnancy, 26 weeks 5. Constipation with bowel program in place  PLAN 1. Recommend adding vancomycin to zosyn for potential gram positive source. This should be dosed by pharmacy.  2. F/u culture data, consider PICC as a potential source, remove if positive blood cultures 3. Continue pain control per recommendations of sickle cell service 4. Care discussed with Dr Glo Herring. Appreciate the consult.   Critical Care time 40 minutes  Randa Lynn, MD Pulmonary and [place] Pager: [phone]  09/20/2014, 7:27 PM  ADDENDUM: pt was  provided with 2L IVF by our team for hypotension, it was not refractory to the fluids.

## 2014-09-20 NOTE — Progress Notes (Signed)
Dr Glo Herring requested to come to bedside to assess patient regarding increasing maternal tachycardia, increasing maternal temp, LOC on narcotic medications, and requesting sepsis protocol for critical care management. Orders received to transfer patient to AICU.

## 2014-09-20 NOTE — Progress Notes (Signed)
SICKLE CELL SERVICE CONSULT NOTE-Follow-up note  [name] FSE:[id number] DOB: 08/11/1990 DOA: 09/18/2014 PCP: [name]., MD  Consulting Service: OB/Gyn Physician: Dr. Glo Herring  Subjective: [name] is a 24 y.o. G2P0010 at [redacted] weeks gestation who presented with back and buttock pain. Pt states that her pain is still at a 10/10. She continues to be nauseated and vomit. She has not eaten much since being here. This morning She spiked a fever of 101.37F and does report feeling hot. Denies any CP, cough, dysuria, or hematuria. Her last bowel movement was two days ago.  Objective: Filed Vitals:   09/20/14 1130 09/20/14 1134 09/20/14 1135 09/20/14 1136  BP:      Pulse: 128 135 134 134  Temp:      TempSrc:      Resp:      Height:      Weight:      SpO2: 85% 79% 87% 87%   BP-111/62  General: Alert, awake, ill-appearing HEENT: [place]/AT PEERL, EOMI OROPHARYNX:  Moist, No exudate/ erythema/lesions.  Heart: Regular rate and rhythm, without murmurs, rubs, gallops Lungs: Clear to auscultation, no wheezing or rhonchi noted. Abdomen: Soft, nontender, nondistended, positive bowel sounds, gravid Neuro: No focal neurological deficits noted cranial nerves II through XII grossly intact. Musculoskeletal: No warmth, swelling, or erythema around joints, no spinal tenderness noted. Extremities: no cyanosis or edema. PICC in left arm Lymph node survey: No cervical lymphadenopathy noted.   Data Reviewed: Basic Metabolic Panel:  Recent Labs Lab 09/18/14 1130 09/19/14 0620 09/20/14 0545  NA 135 133* 137  K 3.7 3.5 3.6  CL 109 108 110  CO2 22 22 23   GLUCOSE 92 105* 94  BUN <5* <5* <5*  CREATININE 0.38* 0.45 0.47  CALCIUM 8.5* 8.0* 8.0*  MG  --  1.5*  --    Liver Function Tests:  Recent Labs Lab 09/18/14 1130 09/19/14 0620  AST 21 23  ALT 18 17  ALKPHOS 51 53  BILITOT 1.3* 2.4*  PROT 6.7 6.2*  ALBUMIN 3.3* 3.2*   No results for input(s): LIPASE, AMYLASE in the  last 168 hours. No results for input(s): AMMONIA in the last 168 hours. CBC:  Recent Labs Lab 09/18/14 1130 09/19/14 0620 09/20/14 0545  WBC 14.9* 20.7* 15.1*  NEUTROABS 12.7* 18.2* 12.4*  HGB 9.2* 7.8* 6.9*  HCT 25.7* 22.0* 19.0*  MCV 86.5 85.3 85.6  PLT 328 280 250   Cardiac Enzymes: No results for input(s): CKTOTAL, CKMB, CKMBINDEX, TROPONINI in the last 168 hours. BNP (last 3 results) No results for input(s): PROBNP in the last 8760 hours. CBG: No results for input(s): GLUCAP in the last 168 hours.  Recent Results (from the past 240 hour(s))  OB Urine Culture     Status: None   Collection Time: 09/18/14  4:20 PM  Result Value Ref Range Status   Specimen Description URINE, CLEAN CATCH  Final   Special Requests NONE  Final   Colony Count   Final    40,000 COLONIES/ML Performed at [facility]    Culture   Final    Multiple bacterial morphotypes present, none predominant. Suggest appropriate recollection if clinically indicated. Note: NO GROUP B STREP (S.AGALACTIAE) ISOLATED  Culture based screening of vaginal/anorectal swabs at  35 to [redacted] weeks gestation is required to rule out the carriage of Group B Streptococcus. Performed at [facility]    Report Status 09/19/2014 FINAL  Final     Studies: US Ob Follow Up  09/17/2014   OBSTETRICAL ULTRASOUND: This exam was performed within a [facility]. The OB US report was generated in the [facility] system, and faxed to the ordering physician.   This report is available in the [facility]. See the [facility] Obstetric US report via the Image Link.  Ir Fluoro Guide Cv Line Left  09/04/2014   CLINICAL DATA:  Sickle cell disease, pregnant, needs IV access  EXAM: PICC PLACEMENT WITH ULTRASOUND AND FLUOROSCOPY  FLUOROSCOPY TIME:  0.1 minutes, 0.4 mGy  TECHNIQUE: After written informed consent was obtained, patient was placed in the supine position on  angiographic table. Patency of the left brachial vein was confirmed with ultrasound with image documentation. An appropriate skin site was determined. Skin site was marked. Region was prepped using maximum barrier technique including cap and mask, sterile gown, sterile gloves, large sterile sheet, and Chlorhexidine as cutaneous antisepsis. The region was infiltrated locally with 1% lidocaine. Under real-time ultrasound guidance, the left brachial vein was accessed with a 21 gauge micropuncture needle; the needle tip within the vein was confirmed with ultrasound image documentation. Needle exchanged over a 018 guidewire for a peel-away sheath, through which a 5-French double-lumen power injectable PICC trimmed to 38cm was advanced, positioned with its tip near the cavoatrial junction. Spot chest radiograph confirms appropriate catheter position. Catheter was flushed per protocol and secured externally. The patient tolerated procedure well.  COMPLICATIONS: COMPLICATIONS none  IMPRESSION: 1. Technically successful five [place] double lumen power injectable PICC placement   Electronically Signed   By: Lucrezia Europe M.D.   On: 09/04/2014 12:42   Ir US Guide Vasc Access Left  09/04/2014   CLINICAL DATA:  Sickle cell disease, pregnant, needs IV access  EXAM: PICC PLACEMENT WITH ULTRASOUND AND FLUOROSCOPY  FLUOROSCOPY TIME:  0.1 minutes, 0.4 mGy  TECHNIQUE: After written informed consent was obtained, patient was placed in the supine position on angiographic table. Patency of the left brachial vein was confirmed with ultrasound with image documentation. An appropriate skin site was determined. Skin site was marked. Region was prepped using maximum barrier technique including cap and mask, sterile gown, sterile gloves, large sterile sheet, and Chlorhexidine as cutaneous antisepsis. The region was infiltrated locally with 1% lidocaine. Under real-time ultrasound guidance, the left brachial vein was accessed with a 21 gauge  micropuncture needle; the needle tip within the vein was confirmed with ultrasound image documentation. Needle exchanged over a 018 guidewire for a peel-away sheath, through which a 5-French double-lumen power injectable PICC trimmed to 38cm was advanced, positioned with its tip near the cavoatrial junction. Spot chest radiograph confirms appropriate catheter position. Catheter was flushed per protocol and secured externally. The patient tolerated procedure well.  COMPLICATIONS: COMPLICATIONS none  IMPRESSION: 1. Technically successful five [place] double lumen power injectable PICC placement   Electronically Signed   By: Lucrezia Europe M.D.   On: 09/04/2014 12:42    Scheduled Meds: . sodium chloride   Intravenous Once  . cefTRIAXone (ROCEPHIN)  IV  2 g Intravenous Q12H  . enoxaparin (LOVENOX) injection  40 mg Subcutaneous Q24H  . famotidine  20 mg Oral Q12H   Or  . famotidine (PEPCID) IV  20 mg Intravenous Q12H  .  folic acid  2 mg Oral Daily  . HYDROmorphone PCA 0.3 mg/mL   Intravenous 6 times per day  . methadone  5 mg Oral QHS  . prenatal vitamin w/FE, FA  1 tablet Oral Q1200  . senna-docusate  1 tablet Oral BID   Continuous Infusions: . sodium chloride 0.9 % 1,000 mL infusion 165 mL/hr at 09/20/14 0255    Principal Problem:   Sickle cell crisis Active Problems:   Chronic pain   Supervision of high-risk pregnancy   Assessment/Plan: Jolanda Mccann is a 24yo with sickle-cell disease who is currently [redacted] weeks pregnant who initially presented to the ER with symptoms consistent with vaso-occlusive crisis. She is currently admitted to Ob/gyn for further pain management and monitoring of her pregnancy. She has spiked a fever this morning.  Recommendations:  1. Sickle Cell Crisis: Opiod tolerant patient. Will not make any changes to current regimen. Continue her custom dose Dilaudid PCA at 0.7mg  q 10 min with a lockout at 4.2mg  per hour. Continue home Methadone 5mg  QHS for long acting control  and Dilaudid 2mg  IV q3h PRN for breakthrough. We will continue to follow and assess pain management needs. 2. ?Sepsis/Fever and Leukocytosis: Pt has an elevated WBC that has gone from 20 to 15. She also spiked a fever to 101.91F this morning. Fever and leukocytosis can be seen in acute crisis. However due to her ill appearance on exam and pregnancy, infection should be presumed. Treat for presumed bacteremia and cover for encapsulated organisms. Obtain blood cultures, UA, and urine cultures. She does not have any respiratory symptoms, but she is hypoxic so please obtain a CXR. 3. Anemia: Hgb down-trended to 6.9 from 7.8. Likely due to hemolysis. Will obtain add-on LDH to today's labs. Please transfuse 2 units PRBC today and recheck post transfusion. Goal to keep Hgb>8.5. Transfusion should help with acute crisis.  4. Constipation: Continue bowel regimen as constipation is an adverse effect due to opioid use. She is not eating very well, so continue to monitor.  5. Sickle Cell Care: Continue Folic acid  Please feel free to call us with any questions or concerns.   [place], [place]

## 2014-09-20 NOTE — Progress Notes (Signed)
ANTIBIOTIC CONSULT NOTE - INITIAL  Pharmacy Consult for Vancomycin Indication: Fever, chronic PICC line  Allergies  Allergen Reactions  . Carrot Oil Hives and Swelling  . Latex Rash    Patient Measurements: Height: 5\' 2"  (157.5 cm) Weight: 170 lb 1.6 oz (77.157 kg) IBW/kg (Calculated) : 50.1 Adjusted Body Weight: 61 kg  Vital Signs: Temp: 98.1 F (36.7 C) (05/07 1900) Temp Source: Oral (05/07 1900) BP: 105/71 mmHg (05/07 1900) Pulse Rate: 100 (05/07 1900)  Labs:  Recent Labs  09/18/14 1130 09/19/14 0620 09/20/14 0545 09/20/14 1215  WBC 14.9* 20.7* 15.1* 26.9*  HGB 9.2* 7.8* 6.9* 7.2*  PLT 328 280 250 277  CREATININE 0.38* 0.45 0.47  --    No results for input(s): VANCOTROUGH, VANCOPEAK, VANCORANDOM, GENTTROUGH, GENTPEAK, GENTRANDOM in the last 72 hours.   Microbiology: Recent Results (from the past 720 hour(s))  Urine culture     Status: None   Collection Time: 09/07/14  5:23 PM  Result Value Ref Range Status   Specimen Description URINE, CLEAN CATCH  Final   Special Requests NONE  Final   Colony Count   Final    25,000 COLONIES/ML Performed at [facility]    Culture   Final    Multiple bacterial morphotypes present, none predominant. Suggest appropriate recollection if clinically indicated. Performed at [facility]    Report Status 09/09/2014 FINAL  Final  OB Urine Culture     Status: None   Collection Time: 09/18/14  4:20 PM  Result Value Ref Range Status   Specimen Description URINE, CLEAN CATCH  Final   Special Requests NONE  Final   Colony Count   Final    40,000 COLONIES/ML Performed at [facility]    Culture   Final    Multiple bacterial morphotypes present, none predominant. Suggest appropriate recollection if clinically indicated. Note: NO GROUP B STREP (S.AGALACTIAE) ISOLATED                                                              Culture based screening of vaginal/anorectal swabs at  35 to [redacted] weeks gestation  is required to rule out the carriage of Group B Streptococcus. Performed at [facility]    Report Status 09/19/2014 FINAL  Final  MRSA PCR Screening     Status: None   Collection Time: 09/20/14  2:20 PM  Result Value Ref Range Status   MRSA by PCR NEGATIVE NEGATIVE Final    Comment:        The GeneXpert MRSA Assay (FDA approved for NASAL specimens only), is one component of a comprehensive MRSA colonization surveillance program. It is not intended to diagnose MRSA infection nor to guide or monitor treatment for MRSA infections.     Medications:  Rocephin 2gm IV q12hrs  Assessment: 24 y.o. female G2P0010 at [redacted]w[redacted]d with chronic PICC line, admitted for sickle cell crisis spiking fevers.   Goal of Therapy:  Vancomycin Trough 10-15 mg/L  Plan:  Vancomycin 1000 mg IV every 8 hrs  Will check vancomycin trough at steady-state around the 5th dose or when clinically indicated.  Jerrye Beavers, PharmD, BCPS Clinical Pharmacist

## 2014-09-20 NOTE — Significant Event (Signed)
CRITICAL VALUE ALERT  Critical value received:  Hgb- 6.9  Date of notification: 09-20-14  Time of notification:  0650  Critical value read back:yes   Nurse who received alert: Coolidge Breeze, RN  MD notified (1st page):  [phone]  Time of first YELY:5909   MD notified (2nd page):  Time of second page:  Responding MD:  Dr. Nehemiah Settle  Time MD responded:  [phone]

## 2014-09-20 NOTE — Progress Notes (Signed)
Dilaudid 4 mg PO administered for generalized pain 8/10.  Pt repositioned and emotional support given.Patient's best friend is at the bedside conversing with her. We will continue to monitor. [name]

## 2014-09-21 DIAGNOSIS — D5700 Hb-SS disease with crisis, unspecified: Secondary | ICD-10-CM

## 2014-09-21 LAB — URINE CULTURE: Colony Count: 5000

## 2014-09-21 LAB — CBC
HEMATOCRIT: 23.6 % — AB (ref 36.0–46.0)
HEMOGLOBIN: 8.4 g/dL — AB (ref 12.0–15.0)
MCH: 28.5 pg (ref 26.0–34.0)
MCHC: 35.6 g/dL (ref 30.0–36.0)
MCV: 80 fL (ref 78.0–100.0)
Platelets: 245 10*3/uL (ref 150–400)
RBC: 2.95 MIL/uL — AB (ref 3.87–5.11)
RDW: 22.1 % — ABNORMAL HIGH (ref 11.5–15.5)
WBC: 16.4 10*3/uL — ABNORMAL HIGH (ref 4.0–10.5)

## 2014-09-21 LAB — BASIC METABOLIC PANEL
ANION GAP: 4 — AB (ref 5–15)
CHLORIDE: 109 mmol/L (ref 101–111)
CO2: 22 mmol/L (ref 22–32)
Calcium: 7.9 mg/dL — ABNORMAL LOW (ref 8.9–10.3)
Creatinine, Ser: 0.41 mg/dL — ABNORMAL LOW (ref 0.44–1.00)
GFR calc non Af Amer: >60 mL/min (ref >60)
Glucose, Bld: 100 mg/dL — ABNORMAL HIGH (ref 70–99)
POTASSIUM: 3 mmol/L — AB (ref 3.5–5.1)
Sodium: 135 mmol/L (ref 135–145)

## 2014-09-21 MED ORDER — ASPIRIN-ACETAMINOPHEN-CAFFEINE 250-250-65 MG PO TABS: 2.0000 | ORAL_TABLET | Freq: Four times a day (QID) | ORAL | Status: DC | PRN

## 2014-09-21 MED ORDER — POTASSIUM CHLORIDE CRYS ER 20 MEQ PO TBCR
40.0000 meq | EXTENDED_RELEASE_TABLET | Freq: Two times a day (BID) | ORAL | Status: DC
  Administered 2014-09-21 – 2014-09-25 (×10): 40 meq via ORAL
  Filled 2014-09-21 (×13): qty 2

## 2014-09-21 NOTE — Progress Notes (Signed)
Pt remove 02 Pulse Ox and Et C02 detector. She is asleep and snoring. Her arms are folded  And it is difficult to swipe the bracelet. At 0200 hrs  Dilaudid history recorder total intake 4.59 mg demanded 8, delivered 8. Writer removed the delivery button. The delivery button will be returned to patient when she is fully awake so that we can reinforce the necessary education. AC was informed. We will continue to monitor.

## 2014-09-21 NOTE — Progress Notes (Signed)
Patient is a 24 y/o single female hospitalized for sickle cell crisis.  Met with mother who was very pleasant and receptive to CSW.  Mother spoke about some of her experiences/challenges of having a chronic health condition.   She reports hx of depression and states that she was on Zoloft and Xanax years ago, but "now I just deal with it through prayer, playing video games, or doing other things to distract me".  She recalls abusing her pain medication in the past when she was dealing with multiple stressors, but denies any current abuse of her medication.  She lives along and receives SSI.  She has applied for foodstamps, WIC, and receives housing assistance.  Informed that FOB is uninvolved and unsupportive.  Mother states that last contact with him was in Dec. 2015 when she informed him of the pregnancy and he became upset because she refused to terminate the pregnancy.   She spent a lot of time talking about the FOB and her disappointment with him not taking responsibility for his child, and how she will address this situation in the future.  Provided supportive feedback and allowed her to vent.   Current boyfriend is reportedly very supportive, and has communicated intent to co-parent with her.     Mother reports extensive family support.  Informed her of CSW availability.

## 2014-09-21 NOTE — Progress Notes (Signed)
FACULTY PRACTICE ANTEPARTUM(COMPREHENSIVE) NOTE  [name] is a 24 y.o. G2P0010 at [redacted]w[redacted]d by LMP who is admitted for sickle cell crisis.   Fetal presentation is breech. Length of Stay:  2  Days  Subjective: Low back pain, no SOB or cough Patient reports the fetal movement as active. Patient reports uterine contraction  activity as none. Patient reports  vaginal bleeding as none. Patient describes fluid per vagina as None.  Vitals:  Blood pressure 105/87, pulse 84, temperature 98.6 F (37 C), temperature source Oral, resp. rate 14, height 5\' 2"  (1.575 m), weight 77.157 kg (170 lb 1.6 oz), last menstrual period 03/20/2014, SpO2 99 %. Physical Examination:  General appearance - alert, well appearing, and in no distress Heart - normal rate and regular rhythm Abdomen - soft, nontender, nondistended Fundal Height:  size equals dates Cervical Exam: Not evaluated.  Extremities: extremities normal, atraumatic, no cyanosis or edema and Homans sign is negative, no sign of DVT Membranes:intact  Fetal Monitoring:  Baseline: 150 bpm  Labs:  Results for orders placed or performed during the hospital encounter of 09/18/14 (from the past 24 hour(s))  Culture, blood (routine x 2)   Collection Time: 09/20/14 12:15 PM  Result Value Ref Range   Specimen Description BLOOD RIGHT ANTECUBITAL    Special Requests BLOOD 1ML BOTTLES DRAWN AEROBIC ONLY    Culture             BLOOD CULTURE RECEIVED NO GROWTH TO DATE CULTURE WILL BE HELD FOR 5 DAYS BEFORE ISSUING A FINAL NEGATIVE REPORT Performed at [facility]    Report Status PENDING   CBC   Collection Time: 09/20/14 12:15 PM  Result Value Ref Range   WBC 26.9 (H) 4.0 - 10.5 K/uL   RBC 2.35 (L) 3.87 - 5.11 MIL/uL   Hemoglobin 7.2 (L) 12.0 - 15.0 g/dL   HCT 20.2 (L) 36.0 - 46.0 %   MCV 86.0 78.0 - 100.0 fL   MCH 30.6 26.0 - 34.0 pg   MCHC 35.6 30.0 - 36.0 g/dL   RDW 17.7 (H) 11.5 - 15.5 %   Platelets 277 150 - 400 K/uL  Culture,  blood (routine x 2)   Collection Time: 09/20/14 12:25 PM  Result Value Ref Range   Specimen Description BLOOD RIGHT HAND    Special Requests BLOOD 5ML BOTTLES DRAWN AEROBIC ONLY    Culture             BLOOD CULTURE RECEIVED NO GROWTH TO DATE CULTURE WILL BE HELD FOR 5 DAYS BEFORE ISSUING A FINAL NEGATIVE REPORT Performed at [facility]    Report Status PENDING   Urinalysis, Routine w reflex microscopic   Collection Time: 09/20/14  1:39 PM  Result Value Ref Range   Color, Urine YELLOW YELLOW   APPearance CLEAR CLEAR   Specific Gravity, Urine 1.020 1.005 - 1.030   pH 5.5 5.0 - 8.0   Glucose, UA NEGATIVE NEGATIVE mg/dL   Hgb urine dipstick NEGATIVE NEGATIVE   Bilirubin Urine SMALL (A) NEGATIVE   Ketones, ur 40 (A) NEGATIVE mg/dL   Protein, ur NEGATIVE NEGATIVE mg/dL   Urobilinogen, UA 4.0 (H) 0.0 - 1.0 mg/dL   Nitrite NEGATIVE NEGATIVE   Leukocytes, UA NEGATIVE NEGATIVE  MRSA PCR Screening   Collection Time: 09/20/14  2:20 PM  Result Value Ref Range   MRSA by PCR NEGATIVE NEGATIVE  Lactic acid, plasma   Collection Time: 09/20/14  4:28 PM  Result Value Ref Range  Lactic Acid, Venous 0.7 0.5 - 2.0 mmol/L    Imaging Studies:     Currently EPIC will not allow sonographic studies to automatically populate into notes.  In the meantime, copy and paste results into note or free text.  Medications:  Scheduled . cefTRIAXone (ROCEPHIN)  IV  2 g Intravenous Q12H  . enoxaparin (LOVENOX) injection  40 mg Subcutaneous Q24H  . famotidine  20 mg Oral Q12H   Or  . famotidine (PEPCID) IV  20 mg Intravenous Q12H  . folic acid  2 mg Oral Daily  . HYDROmorphone PCA 0.3 mg/mL   Intravenous 6 times per day  . methadone  5 mg Oral QHS  . prenatal vitamin w/FE, FA  1 tablet Oral Q1200  . senna-docusate  1 tablet Oral BID  . vancomycin  1,000 mg Intravenous Q8H   I have reviewed the patient's current medications.  ASSESSMENT: Patient Active Problem List   Diagnosis Date Noted  .  Boil of vulva 09/09/2014  . Sickle cell crisis 09/07/2014  . 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 Supervision of high-risk pregnancy 06/11/2014  . Chronic pain 02/26/2013  . Anemia of chronic disease 10/05/2012  . 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 Mood swings 11/08/2011  . Migraines 11/08/2011  . Overweight(278.02) 05/24/2011  . GERD (gastroesophageal reflux disease) 02/17/2011  . Depression 01/06/2011  . Back pain 09/17/2010  . Sickle cell disease 01/08/2009  . 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 Still has pain difficult to manage, being followed by [facility] and [facility].   [name] 09/21/2014,10:18 AM

## 2014-09-21 NOTE — Progress Notes (Signed)
We consulted nutrition because of patient's poor oral intake. Pt is GEST 26 W2d and has not eaten in several days she stated due to nausea and vomiting. Today she ate and drank very little. In the last two days there has been no  C /O of nausea or vomiting.  We will continue to encourage oral intake.

## 2014-09-21 NOTE — Progress Notes (Signed)
Pulse Ox and EtC02 resumed 02 98% with 4L NP, EtCo2 27, RR 22. Pt arousal but not teachable at this time.  We will continue to monitor.

## 2014-09-21 NOTE — Progress Notes (Signed)
PCA cleared @0600  hrs 2.4 mg received, demanded 11, delivered 4. Et C02 =28 RR 18 02  Sat 96 4 L NP.

## 2014-09-21 NOTE — Progress Notes (Signed)
Patient had to be reminded multiple time not to reset the IV pump. She was also educated on the reasons for same. Pt verbalized understanding. We will continue to monitor.

## 2014-09-21 NOTE — Progress Notes (Signed)
Pt requested something other than Tylenol for migraine headache. Pt stated that she does not take Tylenol, however if she ever does it must be with Percocet and at home she takes Excedrin. Dr Roselie Awkward called and informed. We received an order for Excedrin 2 tabs every six hours. Patient informed.

## 2014-09-21 NOTE — Progress Notes (Signed)
SICKLE CELL SERVICE CONSULT NOTE-Follow-up note  [name] is a 24yo with sickle-cell disease who is currently [redacted] weeks pregnant who initially presented to the ER with symptoms consistent with vaso-occlusive crisis. She is currently admitted to Ob/gyn for further pain management and monitoring of her pregnancy. She spiked a fever yesterday and was subsequently transferred to the AICU for sepsis likely 2/2 PICC line infection. Chart reviewed this morning. CXR yesterday was without any acute infiltrates. While in the ICU, I would not make any changes to her pain regimen as pt is not maximizing PCA. If her pain seems to worsen, I would change the frequency of her Dilaudid IV bolus doses to q2h PRN. Please feel free to page me with any questions or concerns at [phone].  Real Cons, MD

## 2014-09-21 NOTE — Progress Notes (Addendum)
Dr. Roselie Awkward in. reveiwed tracing. Audible fetal movement. Will cont to monitor.

## 2014-09-21 NOTE — Progress Notes (Signed)
After obtaining an order for Excedrin pt refused same. I offered Fioricet for headache and non-pharmacologic intervention such as darken room and relaxation technique but pt refused. I then ask pt to tell me how I can better help her, she requested Dilaudid and Fioricet together. Because patient is on PCA Dilaudid, had just taken Methadone I explained to patient that I can not give her both prn together but I can give either or and if it dose not work within the hour I.will give the other prn medication. Pt could not understand the rational but accepted Dilaudid. I asked her if she would like to speak to the doctor she informed writer that she can call him herself. Writer gave prn Dilaudid 2 mg IV and inform MD.  We will continue to monitor, support and assist patient to be as comfortable as possible.

## 2014-09-21 NOTE — Progress Notes (Signed)
Reassessed patient's headache, it is a 6/10. I offered patient Fioricet for headache but she refused. Patient stated "I don't want it if I cant get Dilaudid". I reminded patient that she received dilaudid at 2239 hr and that she can have it every 3 hr.  I also encouraged her to use the PCA. We will continue to monitor.

## 2014-09-22 LAB — VANCOMYCIN, TROUGH: Vancomycin Tr: 7 ug/mL — ABNORMAL LOW (ref 10.0–20.0)

## 2014-09-22 LAB — PREPARE RBC (CROSSMATCH)

## 2014-09-22 MED ORDER — HYDROMORPHONE HCL 2 MG/ML IJ SOLN
2.0000 mg | INTRAMUSCULAR | Status: DC | PRN
  Administered 2014-09-22 – 2014-09-24 (×14): 2 mg via INTRAVENOUS
  Filled 2014-09-22 (×15): qty 1

## 2014-09-22 MED ORDER — VANCOMYCIN HCL 10 G IV SOLR
1250.0000 mg | Freq: Three times a day (TID) | INTRAVENOUS | Status: DC
  Administered 2014-09-22 – 2014-09-23 (×4): 1250 mg via INTRAVENOUS
  Filled 2014-09-22 (×6): qty 1250

## 2014-09-22 MED ORDER — ENSURE ENLIVE PO LIQD
237.0000 mL | Freq: Two times a day (BID) | ORAL | Status: DC
  Administered 2014-09-22 – 2014-09-26 (×9): 237 mL via ORAL
  Filled 2014-09-22 (×13): qty 237

## 2014-09-22 MED ORDER — SODIUM CHLORIDE 0.9 % IV SOLN
INTRAVENOUS | Status: DC
  Administered 2014-09-22 – 2014-09-24 (×6): via INTRAVENOUS

## 2014-09-22 MED ORDER — SODIUM CHLORIDE 0.9 % IV SOLN: Freq: Once | INTRAVENOUS | Status: AC

## 2014-09-22 MED ORDER — HYDROMORPHONE HCL 2 MG/ML IJ SOLN
2.0000 mg | INTRAMUSCULAR | Status: DC | PRN
  Administered 2014-09-22: 2 mg via INTRAVENOUS
  Filled 2014-09-22: qty 1

## 2014-09-22 NOTE — Progress Notes (Signed)
Patient continue to remove her humidified  Oxygen when she falls asleep her Sp02 falls below 90%. We attempt to educate patient on the role 02 place and the protocol to remain on PCA but to no avail. Pt stated that she has been on PCA for six years and she was told by the doctor that as long as her Sp02  is 84% she is ok. I will continue to monitor her Sp02  and inform MD likewise.

## 2014-09-22 NOTE — Progress Notes (Signed)
0200 hrs PCA pump history cleared for 11.97 mg self administered. Demanded 18, delivered 18

## 2014-09-22 NOTE — Progress Notes (Signed)
Unable to administer patient's requested Dilaudid 2 mg IV because she is in the bathroom. We will give same when she gets out.

## 2014-09-22 NOTE — Progress Notes (Signed)
FACULTY PRACTICE ANTEPARTUM(COMPREHENSIVE) NOTE  [name] is a 24 y.o. G2P0010 at [redacted]w[redacted]d by LMP who is admitted for sickle cell crisis.   Fetal presentation is breech. Length of Stay:  3  Days  Subjective: Low back pain with high tolerance to medications Patient reports the fetal movement as active. Patient reports uterine contraction  activity as none. Patient reports  vaginal bleeding as none. Patient describes fluid per vagina as None.  Vitals:  Blood pressure 92/52, pulse 85, temperature 98.7 F (37.1 C), temperature source Oral, resp. rate 24, height 5\' 2"  (1.575 m), weight 77.157 kg (170 lb 1.6 oz), last menstrual period 03/20/2014, SpO2 92 %. Physical Examination:  General appearance - alert, well appearing, and in no distress, chronically ill appearing and lethargic but arousable  Heart - normal rate and regular rhythm Abdomen - soft, nontender, nondistended Fundal Height:  size equals dates Cervical Exam: Not evaluated. Extremities: extremities normal, atraumatic, no cyanosis or edema and Homans sign is negative, no sign of DVT  Membranes:intact  Fetal Monitoring:    Fetal Heart Rate A     Mode  External filed at 09/21/2014 2340    Baseline Rate (A)  140 bpm filed at 09/21/2014 2340    Variability  <5 BPM filed at 09/21/2014 2340    Accelerations  None filed at 09/21/2014 2340    Decelerations  None filed at 09/21/2014 2340        Labs:  Results for orders placed or performed during the hospital encounter of 09/18/14 (from the past 24 hour(s))  CBC   Collection Time: 09/21/14  9:45 AM  Result Value Ref Range   WBC 16.4 (H) 4.0 - 10.5 K/uL   RBC 2.95 (L) 3.87 - 5.11 MIL/uL   Hemoglobin 8.4 (L) 12.0 - 15.0 g/dL   HCT 23.6 (L) 36.0 - 46.0 %   MCV 80.0 78.0 - 100.0 fL   MCH 28.5 26.0 - 34.0 pg   MCHC 35.6 30.0 - 36.0 g/dL   RDW 22.1 (H) 11.5 - 15.5 %   Platelets 245 150 - 400 K/uL  Basic metabolic panel   Collection Time: 09/21/14  9:45 AM   Result Value Ref Range   Sodium 135 135 - 145 mmol/L   Potassium 3.0 (L) 3.5 - 5.1 mmol/L   Chloride 109 101 - 111 mmol/L   CO2 22 22 - 32 mmol/L   Glucose, Bld 100 (H) 70 - 99 mg/dL   BUN <5 (L) 6 - 20 mg/dL   Creatinine, Ser 0.41 (L) 0.44 - 1.00 mg/dL   Calcium 7.9 (L) 8.9 - 10.3 mg/dL   GFR calc non Af Amer >60 >60 mL/min   GFR calc Af Amer >60 >60 mL/min   Anion gap 4 (L) 5 - 15  Vancomycin, trough   Collection Time: 09/22/14  4:00 AM  Result Value Ref Range   Vancomycin Tr 7 (L) 10.0 - 20.0 ug/mL    Imaging Studies:     Currently EPIC will not allow sonographic studies to automatically populate into notes.  In the meantime, copy and paste results into note or free text.  Medications:  Scheduled . sodium chloride   Intravenous Once  . cefTRIAXone (ROCEPHIN)  IV  2 g Intravenous Q12H  . enoxaparin (LOVENOX) injection  40 mg Subcutaneous Q24H  . famotidine  20 mg Oral Q12H   Or  . famotidine (PEPCID) IV  20 mg Intravenous Q12H  . folic acid  2 mg Oral Daily  .  HYDROmorphone PCA 0.3 mg/mL   Intravenous 6 times per day  . methadone  5 mg Oral QHS  . potassium chloride  40 mEq Oral BID  . prenatal vitamin w/FE, FA  1 tablet Oral Q1200  . senna-docusate  1 tablet Oral BID  . vancomycin  1,000 mg Intravenous Q8H   I have reviewed the patient's current medications.  ASSESSMENT: Patient Active Problem List   Diagnosis Date Noted  . Boil of vulva 09/09/2014  . Sickle cell crisis 09/07/2014  . 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 Supervision of high-risk pregnancy 06/11/2014  . Chronic pain 02/26/2013  . Anemia of chronic disease 10/05/2012  . 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 Mood swings 11/08/2011  . Migraines 11/08/2011  . Overweight(278.02) 05/24/2011  . GERD (gastroesophageal reflux disease) 02/17/2011  . Depression 01/06/2011  . Back pain 09/17/2010  . Sickle cell disease 01/08/2009  . 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

## 2014-09-22 NOTE — Progress Notes (Addendum)
Patient's O2 sats on RA 92-94%.  No c/o shortness of breath, chest pain.  Patient refuses humidified oxygen.  Patient was on humidified oxygen yesterday and states that "it made my nosebleed last night."  Will continue to monitor.

## 2014-09-22 NOTE — Progress Notes (Signed)
Difficult to trace baby continuously. Audible movement of fetus, but hr not able to trace during movement.

## 2014-09-22 NOTE — Progress Notes (Signed)
ANTIBIOTIC CONSULT NOTE - FOLLOW UP  Pharmacy Consult for Vancomycin Indication: Fever, chronic PICC line  Allergies  Allergen Reactions  . Carrot Oil Hives and Swelling  . Latex Rash    Patient Measurements: Height: 5\' 2"  (157.5 cm) Weight: 170 lb 1.6 oz (77.157 kg) IBW/kg (Calculated) : 50.1 Adjusted Body Weight: 61 kg  Vital Signs: Temp: 98.7 F (37.1 C) (05/09 0400) Temp Source: Oral (05/09 0400) BP: 92/52 mmHg (05/09 0639) Pulse Rate: 85 (05/09 0400) Intake/Output from previous day: 05/08 0701 - 05/09 0700 In: 5440 [P.O.:1260; I.V.:3630; IV Piggyback:550] Out: 2100 [Urine:2100] Intake/Output from this shift: Total I/O In: 2010 [P.O.:60; I.V.:1650; IV Piggyback:300] Out: 550 [Urine:550]  Labs:  Recent Labs  09/20/14 0545 09/20/14 1215 09/21/14 0945  WBC 15.1* 26.9* 16.4*  HGB 6.9* 7.2* 8.4*  PLT 250 277 245  CREATININE 0.47  --  0.41*   Estimated Creatinine Clearance: 106 mL/min (by C-G formula based on Cr of 0.41).  Recent Labs  09/22/14 0400  [place] 7*     Microbiology: Recent Results (from the past 720 hour(s))  Urine culture     Status: None   Collection Time: 09/07/14  5:23 PM  Result Value Ref Range Status   Specimen Description URINE, CLEAN CATCH  Final   Special Requests NONE  Final   Colony Count   Final    25,000 COLONIES/ML Performed at [facility]    Culture   Final    Multiple bacterial morphotypes present, none predominant. Suggest appropriate recollection if clinically indicated. Performed at [facility]    Report Status 09/09/2014 FINAL  Final  OB Urine Culture     Status: None   Collection Time: 09/18/14  4:20 PM  Result Value Ref Range Status   Specimen Description URINE, CLEAN CATCH  Final   Special Requests NONE  Final   Colony Count   Final    40,000 COLONIES/ML Performed at [facility]    Culture   Final    Multiple bacterial morphotypes present, none predominant. Suggest  appropriate recollection if clinically indicated. Note: NO GROUP B STREP (S.AGALACTIAE) ISOLATED                                                              Culture based screening of vaginal/anorectal swabs at  35 to [redacted] weeks gestation is required to rule out the carriage of Group B Streptococcus. Performed at [facility]    Report Status 09/19/2014 FINAL  Final  Culture, blood (routine x 2)     Status: None (Preliminary result)   Collection Time: 09/20/14 12:15 PM  Result Value Ref Range Status   Specimen Description BLOOD RIGHT ANTECUBITAL  Final   Special Requests BLOOD 1ML BOTTLES DRAWN AEROBIC ONLY  Final   Culture   Final           BLOOD CULTURE RECEIVED NO GROWTH TO DATE CULTURE WILL BE HELD FOR 5 DAYS BEFORE ISSUING A FINAL NEGATIVE REPORT Performed at [facility]    Report Status PENDING  Incomplete  Culture, blood (routine x 2)     Status: None (Preliminary result)   Collection Time: 09/20/14 12:25 PM  Result Value Ref Range Status   Specimen Description BLOOD RIGHT HAND  Final   Special  Requests BLOOD 5ML BOTTLES DRAWN AEROBIC ONLY  Final   Culture   Final           BLOOD CULTURE RECEIVED NO GROWTH TO DATE CULTURE WILL BE HELD FOR 5 DAYS BEFORE ISSUING A FINAL NEGATIVE REPORT Performed at [facility]    Report Status PENDING  Incomplete  Culture, Urine     Status: None   Collection Time: 09/20/14  1:39 PM  Result Value Ref Range Status   Specimen Description URINE, CLEAN CATCH  Final   Special Requests NONE  Final   Colony Count   Final    5,000 COLONIES/ML Performed at [facility]    Culture   Final    INSIGNIFICANT GROWTH Performed at [facility]    Report Status 09/21/2014 FINAL  Final  MRSA PCR Screening     Status: None   Collection Time: 09/20/14  2:20 PM  Result Value Ref Range Status   MRSA by PCR NEGATIVE NEGATIVE Final    Comment:        The GeneXpert MRSA Assay (FDA approved for NASAL specimens only),  is one component of a comprehensive MRSA colonization surveillance program. It is not intended to diagnose MRSA infection nor to guide or monitor treatment for MRSA infections.     Anti-infectives    Start     Dose/Rate Route Frequency Ordered Stop   09/20/14 2000  vancomycin (VANCOCIN) IVPB 1000 mg/200 mL premix     1,000 mg 200 mL/hr over 60 Minutes Intravenous Every 8 hours 09/20/14 1951     09/20/14 1200  cefTRIAXone (ROCEPHIN) 2 g in dextrose 5 % 50 mL IVPB - Premix     2 g 100 mL/hr over 30 Minutes Intravenous Every 12 hours 09/20/14 1145        Assessment: 24 yo G2P0010 at [redacted]w[redacted]d with chronic PICC line admitted 09/18/14 for sickle cell crisis. Pt spiked fevers 09/20/11 with Vancomycin started empirically.  Vancomycin trough was drawn at 0400 5/08 prior to 5th dose and was reported as 7 mcg/ml, below recommended goal.  Goal of Therapy:  Vancomycin troughs 10-15 mcg/ml  Plan:  Increase vancomycin dose to 1250 mg every 8 hours Recheck vancomycin trough at steady state per protocol.  Norberto Sorenson 09/22/2014,6:47 AM

## 2014-09-22 NOTE — Progress Notes (Signed)
Patient NS on telemetry, afebrile, VS WNL.  Dr. Elly Modena gave order to discontinue cardiac monitoring and transfer patient to antenatal unit when bed available.

## 2014-09-22 NOTE — Progress Notes (Signed)
SICKLE CELL SERVICE CONSULT NOTE-Follow-up note  [name] HQP:[id number] DOB: 06/15/1990 DOA: 09/18/2014 PCP: [name]., MD  Consulting Service: OB/Gyn Physician: Dr. Glo Herring  Subjective: [name] is a 24 y.o. G2P0010 at [redacted] weeks gestation who presented with back and buttock pain. Pt states that her pain is still at a 10/10. She continues to be nauseated and vomit once last nigh. She still reports a poor appetite. She has been afebrile for over 24 hours. Pt reports being in more pain when she wakes up but has been noted to be lethargic.  Objective: Filed Vitals:   09/22/14 1034 09/22/14 1040 09/22/14 1045 09/22/14 1100  BP:    115/67  Pulse:  86 81 86  Temp:    98.1 F (36.7 C)  TempSrc:    Oral  Resp: 14 14 17 13   Height:      Weight:      SpO2: 98% 97% 96% 96%    General: Alert, awake, laying in bed, NAD HEENT: [place]/AT PEERL, EOMI OROPHARYNX:  Moist, No exudate/ erythema/lesions.  Heart: Regular rate and rhythm, without murmurs, rubs, gallops Lungs: Clear to auscultation, no wheezing or rhonchi noted. Abdomen: Soft, nontender, nondistended, positive bowel sounds, gravid Neuro: No focal neurological deficits noted cranial nerves II through XII grossly intact. Musculoskeletal: No warmth, swelling, or erythema around joints, no spinal tenderness noted. Extremities: no cyanosis or edema. PICC in left arm Lymph node survey: No cervical lymphadenopathy noted.   Data Reviewed: Basic Metabolic Panel:  Recent Labs Lab 09/18/14 1130 09/19/14 0620 09/20/14 0545 09/21/14 0945  NA 135 133* 137 135  K 3.7 3.5 3.6 3.0*  CL 109 108 110 109  CO2 22 22 23 22   GLUCOSE 92 105* 94 100*  BUN <5* <5* <5* <5*  CREATININE 0.38* 0.45 0.47 0.41*  CALCIUM 8.5* 8.0* 8.0* 7.9*  MG  --  1.5*  --   --    Liver Function Tests:  Recent Labs Lab 09/18/14 1130 09/19/14 0620  AST 21 23  ALT 18 17  ALKPHOS 51 53  BILITOT 1.3* 2.4*  PROT 6.7 6.2*  ALBUMIN 3.3*  3.2*   No results for input(s): LIPASE, AMYLASE in the last 168 hours. No results for input(s): AMMONIA in the last 168 hours. CBC:  Recent Labs Lab 09/18/14 1130 09/19/14 0620 09/20/14 0545 09/20/14 1215 09/21/14 0945  WBC 14.9* 20.7* 15.1* 26.9* 16.4*  NEUTROABS 12.7* 18.2* 12.4*  --   --   HGB 9.2* 7.8* 6.9* 7.2* 8.4*  HCT 25.7* 22.0* 19.0* 20.2* 23.6*  MCV 86.5 85.3 85.6 86.0 80.0  PLT 328 280 250 277 245   Cardiac Enzymes: No results for input(s): CKTOTAL, CKMB, CKMBINDEX, TROPONINI in the last 168 hours. BNP (last 3 results) No results for input(s): PROBNP in the last 8760 hours. CBG: No results for input(s): GLUCAP in the last 168 hours.  Recent Results (from the past 240 hour(s))  OB Urine Culture     Status: None   Collection Time: 09/18/14  4:20 PM  Result Value Ref Range Status   Specimen Description URINE, CLEAN CATCH  Final   Special Requests NONE  Final   Colony Count   Final    40,000 COLONIES/ML Performed at [facility]    Culture   Final    Multiple bacterial morphotypes present, none predominant. Suggest appropriate recollection if clinically indicated. Note: NO GROUP B STREP (S.AGALACTIAE) ISOLATED  Culture based screening of vaginal/anorectal swabs at  35 to [redacted] weeks gestation is required to rule out the carriage of Group B Streptococcus. Performed at [facility]    Report Status 09/19/2014 FINAL  Final  Culture, blood (routine x 2)     Status: None (Preliminary result)   Collection Time: 09/20/14 12:15 PM  Result Value Ref Range Status   Specimen Description BLOOD RIGHT ANTECUBITAL  Final   Special Requests BLOOD 1ML BOTTLES DRAWN AEROBIC ONLY  Final   Culture   Final           BLOOD CULTURE RECEIVED NO GROWTH TO DATE CULTURE WILL BE HELD FOR 5 DAYS BEFORE ISSUING A FINAL NEGATIVE REPORT Performed at [facility]    Report Status PENDING  Incomplete  Culture,  blood (routine x 2)     Status: None (Preliminary result)   Collection Time: 09/20/14 12:25 PM  Result Value Ref Range Status   Specimen Description BLOOD RIGHT HAND  Final   Special Requests BLOOD 5ML BOTTLES DRAWN AEROBIC ONLY  Final   Culture   Final           BLOOD CULTURE RECEIVED NO GROWTH TO DATE CULTURE WILL BE HELD FOR 5 DAYS BEFORE ISSUING A FINAL NEGATIVE REPORT Performed at [facility]    Report Status PENDING  Incomplete  Culture, Urine     Status: None   Collection Time: 09/20/14  1:39 PM  Result Value Ref Range Status   Specimen Description URINE, CLEAN CATCH  Final   Special Requests NONE  Final   Colony Count   Final    5,000 COLONIES/ML Performed at [facility]    Culture   Final    INSIGNIFICANT GROWTH Performed at [facility]    Report Status 09/21/2014 FINAL  Final  MRSA PCR Screening     Status: None   Collection Time: 09/20/14  2:20 PM  Result Value Ref Range Status   MRSA by PCR NEGATIVE NEGATIVE Final    Comment:        The GeneXpert MRSA Assay (FDA approved for NASAL specimens only), is one component of a comprehensive MRSA colonization surveillance program. It is not intended to diagnose MRSA infection nor to guide or monitor treatment for MRSA infections.      Studies: US Ob Follow Up  09/17/2014   OBSTETRICAL ULTRASOUND: This exam was performed within a [facility]. The OB US report was generated in the AS system, and faxed to the ordering physician.   This report is available in the [facility]. See the AS Obstetric US report via the Image Link.  Ir Fluoro Guide Cv Line Left  09/04/2014   CLINICAL DATA:  Sickle cell disease, pregnant, needs IV access  EXAM: PICC PLACEMENT WITH ULTRASOUND AND FLUOROSCOPY  FLUOROSCOPY TIME:  0.1 minutes, 0.4 mGy  TECHNIQUE: After written informed consent was obtained, patient was placed in the supine position on angiographic table. Patency of the left brachial  vein was confirmed with ultrasound with image documentation. An appropriate skin site was determined. Skin site was marked. Region was prepped using maximum barrier technique including cap and mask, sterile gown, sterile gloves, large sterile sheet, and Chlorhexidine as cutaneous antisepsis. The region was infiltrated locally with 1% lidocaine. Under real-time ultrasound guidance, the left brachial vein was accessed with a 21 gauge micropuncture needle; the needle tip within the vein was confirmed with ultrasound image documentation. Needle exchanged over a 018  guidewire for a peel-away sheath, through which a 5-French double-lumen power injectable PICC trimmed to 38cm was advanced, positioned with its tip near the cavoatrial junction. Spot chest radiograph confirms appropriate catheter position. Catheter was flushed per protocol and secured externally. The patient tolerated procedure well.  COMPLICATIONS: COMPLICATIONS none  IMPRESSION: 1. Technically successful five [place] double lumen power injectable PICC placement   Electronically Signed   By: Lucrezia Europe M.D.   On: 09/04/2014 12:42   Ir US Guide Vasc Access Left  09/04/2014   CLINICAL DATA:  Sickle cell disease, pregnant, needs IV access  EXAM: PICC PLACEMENT WITH ULTRASOUND AND FLUOROSCOPY  FLUOROSCOPY TIME:  0.1 minutes, 0.4 mGy  TECHNIQUE: After written informed consent was obtained, patient was placed in the supine position on angiographic table. Patency of the left brachial vein was confirmed with ultrasound with image documentation. An appropriate skin site was determined. Skin site was marked. Region was prepped using maximum barrier technique including cap and mask, sterile gown, sterile gloves, large sterile sheet, and Chlorhexidine as cutaneous antisepsis. The region was infiltrated locally with 1% lidocaine. Under real-time ultrasound guidance, the left brachial vein was accessed with a 21 gauge micropuncture needle; the needle tip within the vein  was confirmed with ultrasound image documentation. Needle exchanged over a 018 guidewire for a peel-away sheath, through which a 5-French double-lumen power injectable PICC trimmed to 38cm was advanced, positioned with its tip near the cavoatrial junction. Spot chest radiograph confirms appropriate catheter position. Catheter was flushed per protocol and secured externally. The patient tolerated procedure well.  COMPLICATIONS: COMPLICATIONS none  IMPRESSION: 1. Technically successful five [place] double lumen power injectable PICC placement   Electronically Signed   By: Lucrezia Europe M.D.   On: 09/04/2014 12:42   Dg Chest Port 1 View  09/20/2014   CLINICAL DATA:  Sickle cell crisis, back and buttock pain, nausea, vomiting, fever, [redacted] weeks pregnant  EXAM: PORTABLE CHEST - 1 VIEW  COMPARISON:  Portable exam 1439 hr compared to 08/19/2014  FINDINGS: LEFT arm PICC line with tip projecting over mid SVC new since previous exam.  Enlargement of cardiac silhouette with pulmonary vascular congestion.  Minimal central peribronchial thickening.  Lungs clear.  No pleural effusion or pneumothorax.  Patchy areas of sclerosis within the humeral heads bilaterally compatible with bone infarcts.  IMPRESSION: Enlargement of cardiac silhouette with pulmonary vascular congestion.  Minimal bronchitic changes without definite acute pulmonary infiltrates.   Electronically Signed   By: Lavonia Dana M.D.   On: 09/20/2014 15:01    Scheduled Meds: . cefTRIAXone (ROCEPHIN)  IV  2 g Intravenous Q12H  . enoxaparin (LOVENOX) injection  40 mg Subcutaneous Q24H  . famotidine  20 mg Oral Q12H   Or  . famotidine (PEPCID) IV  20 mg Intravenous Q12H  . feeding supplement (ENSURE ENLIVE)  237 mL Oral BID BM  . folic acid  2 mg Oral Daily  . HYDROmorphone PCA 0.3 mg/mL   Intravenous 6 times per day  . methadone  5 mg Oral QHS  . potassium chloride  40 mEq Oral BID  . prenatal vitamin w/FE, FA  1 tablet Oral Q1200  . senna-docusate  1 tablet  Oral BID  . vancomycin  1,250 mg Intravenous Q8H   Continuous Infusions: . sodium chloride      Principal Problem:   Sickle cell crisis Active Problems:   Chronic pain   Supervision of high-risk pregnancy   Assessment/Plan: Allien Melberg is a 23yo with sickle-cell  disease who is currently [redacted] weeks pregnant who initially presented to the ER with symptoms consistent with vaso-occlusive crisis. She is currently admitted to Ob/gyn for further pain management and monitoring of her pregnancy. She spiked a fever on 09/19/13 and was presumably treated for sepsis. Blood cultures have NGTD. She has been afebrile for over 24 hours on Vanc and Ceftriaxone.  Recommendations:  1. Sickle Cell Crisis: Opiod tolerant patient. Continue her custom dose Dilaudid PCA at 0.7mg  q 10 min with a lockout at 4.2mg  per hour. Continue home Methadone 5mg  QHS for long acting control. Since she reports still having severe pain, will decrease frequency of Dilaudid 2mg  IV from q3h to q2h PRN for breakthrough. We will continue to follow and assess pain management needs. 2. ?Sepsis: Management per critical care 3. Anemia: Hgb at 8.4 yesterday after 2 units of PRBC. She is currently being transfused 1 unit. Anemia is likely due to hemolysis as she had an initially elevated LDH prior to transfusion. Recheck CBC w/ diff in the AM. Goal to keep Hgb>8.5. Transfusion should help with acute crisis.  4. Constipation: Continue bowel regimen as constipation is an adverse effect due to opioid use. She is not eating very well, so continue to monitor.  5. Sickle Cell Care: Continue Folic acid  Please feel free to call us with any questions or concerns.   [place], [name]

## 2014-09-22 NOTE — Progress Notes (Signed)
Antenatal Nutrition Assessment:  Currently  26 4/[redacted] weeks gestation, with Sickle Cell Crisis. Height  62 "  Weight 170 lbs  pre-pregnancy weight 163 lbs .  Pre-pregnancy  BMI 29.9  IBW 110 lbs Total weight gain 7.lbs Weight gain goals 15-25 lbs Estimated needs: 1900-2100 kcal/day, 81-89 grams protein/day, 2.2 liters fluid/day  Reg diet . Reported poor po intake. Pt would not converse with RD during visit, Attempted to engage in converation/ ask questions 4 times but she would not waken. Have provided pt with cafeteria menu for additional menu options, snack menu for snacks TID, Ensure Enlive BID Current diet prescription will provide for increased needs.  Nutrition related labs reviewed  Nutrition Dx: Increased nutrient needs r/t pregnancy and fetal growth requirements aeb [redacted] weeks gestation.  No educational needs assessed at this time.  Weyman Rodney M.Fredderick Severance LDN Neonatal Nutrition Support Specialist/RD III Pager [phone]

## 2014-09-22 NOTE — Progress Notes (Signed)
Patient encouraged several times throughout the day to order a meal tray.  Patient stated that she, "wasn't hungry."  Patient did drink two strawberry Ensures today.  Tolerated well.

## 2014-09-22 NOTE — Progress Notes (Signed)
1559 - Patient rang the call bell.  RN answered.  Patient stated that she was "going to throw-up."  RN entered room.  Provided patient with green emesis bag.  Patient vomited approximately 30 ml greenish brown bile.  Zofran IV administered.  Patient resting in bed.

## 2014-09-22 NOTE — Progress Notes (Signed)
Patient removing O2 sat fingertip probe multiple times during the day.  Patient educated on the importance of keeping the probe on at all times.

## 2014-09-23 ENCOUNTER — Encounter: Payer: Medicare Other | Admitting: Obstetrics & Gynecology

## 2014-09-23 ENCOUNTER — Encounter (HOSPITAL_COMMUNITY): Payer: Self-pay | Admitting: *Deleted

## 2014-09-23 DIAGNOSIS — O99012 Anemia complicating pregnancy, second trimester: Principal | ICD-10-CM

## 2014-09-23 LAB — TYPE AND SCREEN
ABO/RH(D): B POS
ANTIBODY SCREEN: NEGATIVE
DONOR AG TYPE: NEGATIVE
Donor AG Type: NEGATIVE
Donor AG Type: NEGATIVE
UNIT DIVISION: 0
Unit division: 0
Unit division: 0
Unit tag comment: NEGATIVE
Unit tag comment: NEGATIVE
Unit tag comment: NEGATIVE

## 2014-09-23 MED ORDER — IBUPROFEN 600 MG PO TABS
600.0000 mg | ORAL_TABLET | Freq: Three times a day (TID) | ORAL | Status: AC
  Administered 2014-09-23 – 2014-09-25 (×6): 600 mg via ORAL
  Filled 2014-09-23 (×6): qty 1

## 2014-09-23 NOTE — Care Management Note (Signed)
Case Management Note  Patient Details  Name: [name] MRN: [id number] Date of Birth: 02-29-1991  Medicare Important Message Given:  Yes Date Medicare IM Given:  09/23/14 Medicare IM give by:  Juliet Rude RNC BSN   Yong Channel, RN 09/23/2014, 1:55 PM

## 2014-09-23 NOTE — Progress Notes (Signed)
Patient ID: [name], female   DOB: 11-06-1990, 24 y.o.   MRN: [id number] [place]) NOTE  [name] is a 24 y.o. G2P0010 at [redacted]w[redacted]d by LMP who is admitted for sickle cell crisis.   Fetal presentation is breech. Length of Stay:  4  Days  Subjective: Low back pain with high tolerance to medications Patient reports the fetal movement as active. Patient reports uterine contraction  activity as none. Patient reports  vaginal bleeding as none. Patient describes fluid per vagina as None.  Vitals:  Blood pressure 109/64, pulse 94, temperature 98.4 F (36.9 C), temperature source Oral, resp. rate 15, height 5\' 2"  (1.575 m), weight 170 lb 1.6 oz (77.157 kg), last menstrual period 03/20/2014, SpO2 92 %. Physical Examination:  General appearance - alert, well appearing, and in no distress, chronically ill appearing and lethargic but arousable  Heart - normal rate and regular rhythm Abdomen - soft, nontender, nondistended Fundal Height:  size equals dates Cervical Exam: Not evaluated. Extremities: extremities normal, atraumatic, no cyanosis or edema and Homans sign is negative, no sign of DVT  Membranes:intact  Fetal Monitoring:  Baseline 135, min-mod variability, no accels, no decles Toco: no contractions    Labs:  No results found for this or any previous visit (from the past 24 hour(s)).  Imaging Studies:     Currently EPIC will not allow sonographic studies to automatically populate into notes.  In the meantime, copy and paste results into note or free text.  Medications:  Scheduled . cefTRIAXone (ROCEPHIN)  IV  2 g Intravenous Q12H  . enoxaparin (LOVENOX) injection  40 mg Subcutaneous Q24H  . famotidine  20 mg Oral Q12H   Or  . famotidine (PEPCID) IV  20 mg Intravenous Q12H  . feeding supplement (ENSURE ENLIVE)  237 mL Oral BID BM  . folic acid  2 mg Oral Daily  . HYDROmorphone PCA 0.3 mg/mL   Intravenous 6 times per day  . methadone   5 mg Oral QHS  . potassium chloride  40 mEq Oral BID  . prenatal vitamin w/FE, FA  1 tablet Oral Q1200  . senna-docusate  1 tablet Oral BID  . vancomycin  1,250 mg Intravenous Q8H   I have reviewed the patient's current medications.  ASSESSMENT: Patient Active Problem List   Diagnosis Date Noted  . Supervision of high-risk pregnancy 06/11/2014    Priority: Medium  . Sickle cell disease 01/08/2009    Priority: Medium  . Boil of vulva 09/09/2014  . Sickle cell crisis 09/07/2014  . 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 Chronic pain 02/26/2013  . Anemia of chronic disease 10/05/2012  . 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 Mood swings 11/08/2011  . Migraines 11/08/2011  . Overweight(278.02) 05/24/2011  . GERD (gastroesophageal reflux disease) 02/17/2011  . Depression 01/06/2011  . Back pain 09/17/2010  . 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

## 2014-09-23 NOTE — Consult Note (Signed)
[facility] Medical Consultation  [name] ZWC:[id number] DOB: 1990-09-11 DOA: 09/18/2014 PCP: [name]., MD   Requesting physician: Dr. Nehemiah Settle Date of follow up consultation: 09/23/2014 Reason for consultation: Sickle Cell Criis  Impression/Recommendations Principal Problem: 1. Hb SS with crisis: Pt is still having pain which is likely a combination of vaso-occlusion and muscle pain associated with pregnancy. Pt has used 33 mg with 54/48:demands/deliveries in the last 24 hours. I will recommend Voltaren gel topically if not contraindicated by Ob. Continue PCA at current dosing and decrease IVF to [facility]. Will check CBC wit diff tomorrow and LDH.  2. Leukocytosis: This is likely related to her crisis and inflammatory component as well as increased Bone Marrow turnover.  She would benefit from anti-inflammatory if okay with Ob. If not consider topical Volteren gel.   3. Fever: Pt had one episode of fever which is likely related to her crisis, She has no over signs of infection. Recommend discontinuing antibiotics and observe. If she has fevers off antibiotics then PICC will have to be removed and cultured and left out until cultures are negative for 5 days.  4. Hypoxemia: Likely secondary to some excess fluid and atelectasis. If her oxygen saturations remain low, will repeat CXR. Encouraged to use incentive spirometer.  I will followup again tomorrow. Please contact me if I can be of assistance in the meanwhile. Thank you for this consultation.  Subjective: Pt rates pain 7/10 localized to her low back and buttocks. She has not been walking secondary to the pain.     Past Medical History  Diagnosis Date  . Miscarriage 03/22/2011    Pt reports 2 miscarriages.  . Depression 01/06/2011  . GERD (gastroesophageal reflux disease) 02/17/2011  . Trichotillomania     h/o  . Blood transfusion     "lots"  . Sickle cell anemia with crisis   . Exertional dyspnea    "sometimes"  . Migraines 11/08/11    "@ least twice/month"  . Chronic back pain     "very severe; have knot in my back; from tight muscle; take RX and exercise for it"  . Mood swings 11/08/11    "I go back and forth; real bad"  . Sickle cell anemia    Past Surgical History  Procedure Laterality Date  . Cholecystectomy  05/2010  . Dilation and curettage of uterus  02/20/11    S/P miscarriage   Social History:  reports that she quit smoking about 17 months ago. Her smoking use included Cigarettes. She has a .25 pack-year smoking history. She has never used smokeless tobacco. She reports that she does not drink alcohol or use illicit drugs.  Allergies  Allergen Reactions  . Carrot Oil Hives and Swelling  . Latex Rash   Family History  Problem Relation Age of Onset  . Alcoholism    . Depression    . Hypercholesterolemia    . Hypertension    . Migraines    . Diabetes Maternal Grandmother   . Diabetes Paternal Grandmother   . Diabetes Maternal Grandfather   . Sickle cell trait Mother   . Sickle cell trait Father     Prior to Admission medications   Medication Sig Start Date End Date Taking? Authorizing Provider  folic acid (FOLVITE) 1 MG tablet Take 2 tablets (2 mg total) by mouth daily. 04/24/14  Yes Dorena Dew, FNP  methadone (DOLOPHINE) 5 MG tablet Take 1 tablet (5 mg total) by mouth at bedtime. 08/26/14  Yes Sharyn Lull  Linus Galas, MD  oxyCODONE (ROXICODONE) 15 MG immediate release tablet Take 1 tablet (15 mg total) by mouth every 4 (four) hours as needed for pain. 09/08/14  Yes [name], MD  Prenatal Vit-Min-FA-Fish Oil ([facility] PRENATAL GUMMY) 0.4-113.5 MG CHEW Chew 2 each by mouth daily.   Yes Historical Provider, MD  acetaminophen (TYLENOL) 500 MG tablet Take 1,000 mg by mouth every 6 (six) hours as needed for moderate pain or headache.    Historical Provider, MD   Physical Exam: Blood pressure 97/56, pulse 81, temperature 99.3 F (37.4 C), temperature source Oral,  resp. rate 18, height 5\' 2"  (1.575 m), weight 170 lb 1.6 oz (77.157 kg), last menstrual period 03/20/2014, SpO2 93 %. Filed Vitals:   09/23/14 0826 09/23/14 0900 09/23/14 1000 09/23/14 1357  BP:      Pulse: 81     Temp:      TempSrc:      Resp:   12 18  Height:      Weight:      SpO2: 97% 98% 95% 93%   General: Alert, awake, oriented x3, in mild distress.  HEENT: [place]/AT PEERL, EOMI, anicteric Neck: Trachea midline, no masses, no thyromegal,y no JVD, no carotid bruit OROPHARYNX: Moist, No exudate/ erythema/lesions.  Heart: Regular rate and rhythm, without murmurs, rubs, gallops or S3. PMI non-displaced. Exam reveals no decreased pulses. Pulmonary/Chest: Normal effort. Breath sounds normal. No. Apnea. Clear to auscultation,no stridor,  no wheezing and no rhonchi noted. No respiratory distress and no tenderness noted. Abdomen: Gravid abdomen. Neuro: Alert and oriented to person, place and time. Normal motor skills, Displays no atrophy or tremors and exhibits normal muscle tone.  No focal neurological deficits noted cranial nerves II through XII grossly intact. No sensory deficit noted. DTRs 2+ bilaterally upper and lower extremities. Strength at baseline in bilateral upper and lower extremities. Gait normal. Musculoskeletal: No warm swelling or erythema around joints, no spinal tenderness noted. Psychiatric: Patient alert and oriented x3, good insight and cognition, good recent to remote recall. Lymph node survey: No cervical axillary or inguinal lymphadenopathy noted. Skin: Skin is warm and dry. No bruising, no ecchymosis and no rash noted. Pt is not diaphoretic. No erythema. No pallor Psychiatric: Mood, memory, affect and judgement normal   Labs on Admission:  Basic Metabolic Panel:  Recent Labs Lab 09/18/14 1130 09/19/14 0620 09/20/14 0545 09/21/14 0945  NA 135 133* 137 135  K 3.7 3.5 3.6 3.0*  CL 109 108 110 109  CO2 22 22 23 22   GLUCOSE 92 105* 94 100*  BUN <5* <5* <5*  <5*  CREATININE 0.38* 0.45 0.47 0.41*  CALCIUM 8.5* 8.0* 8.0* 7.9*  MG  --  1.5*  --   --    Liver Function Tests:  Recent Labs Lab 09/18/14 1130 09/19/14 0620  AST 21 23  ALT 18 17  ALKPHOS 51 53  BILITOT 1.3* 2.4*  PROT 6.7 6.2*  ALBUMIN 3.3* 3.2*   No results for input(s): LIPASE, AMYLASE in the last 168 hours. No results for input(s): AMMONIA in the last 168 hours. CBC:  Recent Labs Lab 09/18/14 1130 09/19/14 0620 09/20/14 0545 09/20/14 1215 09/21/14 0945  WBC 14.9* 20.7* 15.1* 26.9* 16.4*  NEUTROABS 12.7* 18.2* 12.4*  --   --   HGB 9.2* 7.8* 6.9* 7.2* 8.4*  HCT 25.7* 22.0* 19.0* 20.2* 23.6*  MCV 86.5 85.3 85.6 86.0 80.0  PLT 328 280 250 277 245    Time spent: 45 minutes  MATTHEWS,MICHELLE  A. Sickle Cell Service Pager [phone]  If 7PM-7AM, please contact night-coverage [email or web address] Password [email or web address] 09/23/2014, 3:31 PM

## 2014-09-24 DIAGNOSIS — D72829 Elevated white blood cell count, unspecified: Secondary | ICD-10-CM

## 2014-09-24 DIAGNOSIS — R509 Fever, unspecified: Secondary | ICD-10-CM

## 2014-09-24 LAB — BASIC METABOLIC PANEL
Anion gap: 6 (ref 5–15)
BUN: 8 mg/dL (ref 6–20)
CALCIUM: 8.6 mg/dL — AB (ref 8.9–10.3)
CO2: 26 mmol/L (ref 22–32)
CREATININE: 0.46 mg/dL (ref 0.44–1.00)
Chloride: 110 mmol/L (ref 101–111)
GLUCOSE: 93 mg/dL (ref 70–99)
Potassium: 4.3 mmol/L (ref 3.5–5.1)
SODIUM: 142 mmol/L (ref 135–145)

## 2014-09-24 LAB — CBC WITH DIFFERENTIAL/PLATELET
Basophils Absolute: 0 10*3/uL (ref 0.0–0.1)
Basophils Relative: 0 % (ref 0–1)
EOS ABS: 0.5 10*3/uL (ref 0.0–0.7)
EOS PCT: 4 % (ref 0–5)
HEMATOCRIT: 24.9 % — AB (ref 36.0–46.0)
HEMOGLOBIN: 8.7 g/dL — AB (ref 12.0–15.0)
LYMPHS ABS: 2.8 10*3/uL (ref 0.7–4.0)
Lymphocytes Relative: 21 % (ref 12–46)
MCH: 28.1 pg (ref 26.0–34.0)
MCHC: 34.9 g/dL (ref 30.0–36.0)
MCV: 80.3 fL (ref 78.0–100.0)
MONO ABS: 0.9 10*3/uL (ref 0.1–1.0)
Monocytes Relative: 7 % (ref 3–12)
NEUTROS ABS: 9.2 10*3/uL — AB (ref 1.7–7.7)
Neutrophils Relative %: 68 % (ref 43–77)
Platelets: 293 10*3/uL (ref 150–400)
RBC: 3.1 MIL/uL — ABNORMAL LOW (ref 3.87–5.11)
RDW: 21.3 % — AB (ref 11.5–15.5)
WBC: 13.4 10*3/uL — ABNORMAL HIGH (ref 4.0–10.5)

## 2014-09-24 LAB — MAGNESIUM: Magnesium: 1.8 mg/dL (ref 1.7–2.4)

## 2014-09-24 LAB — GLUCOSE TOLERANCE, 1 HOUR: Glucose, 1 Hour GTT: 103 mg/dL (ref 70–140)

## 2014-09-24 MED ORDER — HYDROMORPHONE HCL 2 MG/ML IJ SOLN
2.0000 mg | INTRAMUSCULAR | Status: DC | PRN
  Administered 2014-09-24 – 2014-09-26 (×9): 2 mg via INTRAVENOUS
  Filled 2014-09-24 (×10): qty 1

## 2014-09-24 MED ORDER — OXYCODONE HCL 5 MG PO TABS
15.0000 mg | ORAL_TABLET | ORAL | Status: DC
  Administered 2014-09-24 – 2014-09-26 (×11): 15 mg via ORAL
  Filled 2014-09-24 (×11): qty 3

## 2014-09-24 MED ORDER — HYDROMORPHONE 0.3 MG/ML IV SOLN
INTRAVENOUS | Status: DC
  Administered 2014-09-24: 20:00:00 via INTRAVENOUS
  Administered 2014-09-24: 8.99 mg via INTRAVENOUS
  Administered 2014-09-24: 2.99 mg via INTRAVENOUS
  Administered 2014-09-24 – 2014-09-25 (×2): via INTRAVENOUS
  Administered 2014-09-25: 5.9 mg via INTRAVENOUS
  Administered 2014-09-25 (×2): via INTRAVENOUS
  Administered 2014-09-25: 4.96 mg via INTRAVENOUS
  Administered 2014-09-25: 6.99 mg via INTRAVENOUS
  Administered 2014-09-25: 15:00:00 via INTRAVENOUS
  Administered 2014-09-25: 5.49 mg via INTRAVENOUS
  Administered 2014-09-25: 5.65 mg via INTRAVENOUS
  Administered 2014-09-26: 5.99 mg via INTRAVENOUS
  Filled 2014-09-24 (×7): qty 25

## 2014-09-24 NOTE — Progress Notes (Signed)
FACULTY PRACTICE ANTEPARTUM NOTE  [name] is a 24 y.o. G2P0010 at [redacted]w[redacted]d  who is admitted for sickle cell crisi.   Fetal presentation is unsure. Length of Stay:  5  Days  Subjective:  Patient feels a little less achy with the addition of ibuprofen last night.  Was up and moving around room a little yesterday.  Using incentive spirometer - still a little SOB.  Denies fever, chills, nausea, vomiting.  Patient reports good fetal movement.  She reports no uterine contractions, no bleeding and no loss of fluid per vagina.  Vitals:  Blood pressure 109/62, pulse 92, temperature 98.4 F (36.9 C), temperature source Oral, resp. rate 14, height 5\' 2"  (1.575 m), weight 170 lb 1.6 oz (77.157 kg), last menstrual period 03/20/2014, SpO2 100 %. Physical Examination:  General appearance - alert, well appearing, and in no distress Chest - clear to auscultation, no wheezes, rales or rhonchi, symmetric air entry Heart - normal rate, regular rhythm, normal S1, S2, no murmurs, rubs, clicks or gallops Abdomen - soft, nontender, nondistended, no masses or organomegaly Skin - normal coloration and turgor, no rashes, no suspicious skin lesions noted Fundal Height:  size equals dates Extremities: extremities normal, atraumatic, no cyanosis or edema  Membranes:intact  Fetal Monitoring:  Baseline: 140 bpm, Variability: Good {> 6 bpm), Accelerations: Non-reactive but appropriate for gestational age and Decelerations: Absent  Labs:  No results found for this or any previous visit (from the past 24 hour(s)).  Imaging Studies:    none  Medications:  Scheduled . enoxaparin (LOVENOX) injection  40 mg Subcutaneous Q24H  . famotidine  20 mg Oral Q12H   Or  . famotidine (PEPCID) IV  20 mg Intravenous Q12H  . feeding supplement (ENSURE ENLIVE)  237 mL Oral BID BM  . folic acid  2 mg Oral Daily  . HYDROmorphone PCA 0.3 mg/mL   Intravenous 6 times per day  . ibuprofen  600 mg Oral 3 times per day  .  methadone  5 mg Oral QHS  . potassium chloride  40 mEq Oral BID  . prenatal vitamin w/FE, FA  1 tablet Oral Q1200  . senna-docusate  1 tablet Oral BID   I have reviewed the patient's current medications.  ASSESSMENT: Patient Active Problem List   Diagnosis Date Noted  . Boil of vulva 09/09/2014  . Sickle cell crisis 09/07/2014  . 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 Supervision of high-risk pregnancy 06/11/2014  . Chronic pain 02/26/2013  . Anemia of chronic disease 10/05/2012  . 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 Mood swings 11/08/2011  . Migraines 11/08/2011  . Overweight(278.02) 05/24/2011  . GERD (gastroesophageal reflux disease) 02/17/2011  . Depression 01/06/2011  . Back pain 09/17/2010  . Sickle cell disease 01/08/2009  . 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 IUP at [redacted]w[redacted]d 2.  Sickle Cell Crisis 3.  Fever 4.  Leukocytosis  NST appropriate for gestational age - reassuring. Fundal height appropriate. Continue NST twice daily.  Continue pain control for Sickle cell Crisis. Ibuprofen for 48 hours started last night - discussed with patient that can only use this for a limited duration and that she should not take it at home.  Antibiotics stopped last night - no rebound fever.  Likely part of sickle cell crisis.  Blood cultures negative.  Labs pending for this morning. Continue routine antenatal care.   Truett Mainland, DO 09/24/2014,6:34 AM

## 2014-09-24 NOTE — Consult Note (Signed)
[facility] Medical Consultation  [name] KZS:[id number] DOB: 02-13-1991 DOA: 09/18/2014 PCP: [name]., MD   Requesting physician: Dr. Nehemiah Settle Date of follow up consultation: 09/23/2014 Reason for consultation: Sickle Cell Criis  Impression/Recommendations Principal Problem: 1. Hb SS with crisis: Pt is still having pain which is likely a combination of vaso-occlusion and muscle pain associated with pregnancy. I will schedule oral analgesics and  continue PCA at decreased PCA bolus dosing for PRN use. Recommend  check CBC with diff tomorrow.  2. Leukocytosis: Improving despite cessation of antibiotics. This is likely related to her crisis and inflammatory component as well as increased Bone Marrow turnover. She has responded to the antiinflammatory with a decrease in WBC. Will defer to Ob regarding the duration of NSAID use.  3. Fever: Pt had one episode of fever which is likely related to her crisis, She has no overt signs of infection. Antibiotics were discontinued yesterday and she has had no further fevers. I would continue to observe without antibiotics. If she has fevers off antibiotics then PICC will have to be removed and cultured and left out until cultures are negative for 5 days.  4. Hypoxemia: Resolved with incentive spirometer use. Encouraged continued use incentive spirometer.  5. Psychological Stress: Pt feeling stressed. Will ask SW to visit with patient for resources and therapeutic conversations..   I will followup again tomorrow. Please contact me if I can be of assistance in the meanwhile. Thank you for this consultation.  Subjective: Pt rates pain 6/10 localized to her low back and buttocks. She has had a BM yesterday    Past Medical History  Diagnosis Date  . Miscarriage 03/22/2011    Pt reports 2 miscarriages.  . Depression 01/06/2011  . GERD (gastroesophageal reflux disease) 02/17/2011  . Trichotillomania     h/o  . Blood transfusion     "lots"  . Sickle cell anemia with crisis   . Exertional dyspnea     "sometimes"  . Migraines 11/08/11    "@ least twice/month"  . Chronic back pain     "very severe; have knot in my back; from tight muscle; take RX and exercise for it"  . Mood swings 11/08/11    "I go back and forth; real bad"  . Sickle cell anemia    Past Surgical History  Procedure Laterality Date  . Cholecystectomy  05/2010  . Dilation and curettage of uterus  02/20/11    S/P miscarriage   Social History:  reports that she quit smoking about 18 months ago. Her smoking use included Cigarettes. She has a .25 pack-year smoking history. She has never used smokeless tobacco. She reports that she does not drink alcohol or use illicit drugs.  Allergies  Allergen Reactions  . Carrot Oil Hives and Swelling  . Latex Rash   Family History  Problem Relation Age of Onset  . Alcoholism    . Depression    . Hypercholesterolemia    . Hypertension    . Migraines    . Diabetes Maternal Grandmother   . Diabetes Paternal Grandmother   . Diabetes Maternal Grandfather   . Sickle cell trait Mother   . Sickle cell trait Father     Prior to Admission medications   Medication Sig Start Date End Date Taking? Authorizing Provider  folic acid (FOLVITE) 1 MG tablet Take 2 tablets (2 mg total) by mouth daily. 04/24/14  Yes Dorena Dew, FNP  methadone (DOLOPHINE) 5 MG tablet Take 1 tablet (5  mg total) by mouth at bedtime. 08/26/14  Yes [name], MD  oxyCODONE (ROXICODONE) 15 MG immediate release tablet Take 1 tablet (15 mg total) by mouth every 4 (four) hours as needed for pain. 09/08/14  Yes [name], MD  Prenatal Vit-Min-FA-Fish Oil ([employer] PRENATAL GUMMY) 0.4-113.5 MG CHEW Chew 2 each by mouth daily.   Yes Historical Provider, MD  acetaminophen (TYLENOL) 500 MG tablet Take 1,000 mg by mouth every 6 (six) hours as needed for moderate pain or headache.    Historical Provider, MD   Physical Exam: Blood pressure  122/77, pulse 98, temperature 98.3 F (36.8 C), temperature source Oral, resp. rate 18, height 5\' 2"  (1.575 m), weight 170 lb 1.6 oz (77.157 kg), last menstrual period 03/20/2014, SpO2 96 %. Filed Vitals:   09/24/14 1350 09/24/14 1429 09/24/14 1507 09/24/14 1536  BP:   122/77   Pulse:   98   Temp:      TempSrc:      Resp: 15 15  18   Height:      Weight:      SpO2: 93% 95%  96%   General: Alert, awake, oriented x3, in mild distress.  HEENT: [place]/AT PEERL, EOMI, anicteric Neck: Trachea midline, no masses, no thyromegal,y no JVD, no carotid bruit OROPHARYNX: Moist, No exudate/ erythema/lesions.  Heart: Regular rate and rhythm, without murmurs, rubs, gallops or S3. PMI non-displaced. Exam reveals no decreased pulses. Pulmonary/Chest: Normal effort. Breath sounds normal. No. Apnea. Clear to auscultation,no stridor,  no wheezing and no rhonchi noted. No respiratory distress and no tenderness noted. Abdomen: Gravid abdomen. Neuro: Alert and oriented to person, place and time. Normal motor skills, Displays no atrophy or tremors and exhibits normal muscle tone.  No focal neurological deficits noted cranial nerves II through XII grossly intact. No sensory deficit noted. DTRs 2+ bilaterally upper and lower extremities. Strength at baseline in bilateral upper and lower extremities. Gait normal. Musculoskeletal: No warm swelling or erythema around joints, no spinal tenderness noted. Psychiatric: Patient alert and oriented x3, good insight and cognition, good recent to remote recall. Lymph node survey: No cervical axillary or inguinal lymphadenopathy noted. Skin: Skin is warm and dry. No bruising, no ecchymosis and no rash noted. Pt is not diaphoretic. No erythema. No pallor Psychiatric: Pt teary and states that she has had a lot of financial issues that is contributing to her stress.   Labs on Admission:  Basic Metabolic Panel:  Recent Labs Lab 09/18/14 1130 09/19/14 0620 09/20/14 0545  09/21/14 0945 09/24/14 0550  NA 135 133* 137 135 142  K 3.7 3.5 3.6 3.0* 4.3  CL 109 108 110 109 110  CO2 22 22 23 22 26   GLUCOSE 92 105* 94 100* 93  BUN <5* <5* <5* <5* 8  CREATININE 0.38* 0.45 0.47 0.41* 0.46  CALCIUM 8.5* 8.0* 8.0* 7.9* 8.6*  MG  --  1.5*  --   --  1.8   Liver Function Tests:  Recent Labs Lab 09/18/14 1130 09/19/14 0620  AST 21 23  ALT 18 17  ALKPHOS 51 53  BILITOT 1.3* 2.4*  PROT 6.7 6.2*  ALBUMIN 3.3* 3.2*   No results for input(s): LIPASE, AMYLASE in the last 168 hours. No results for input(s): AMMONIA in the last 168 hours. CBC:  Recent Labs Lab 09/18/14 1130 09/19/14 0620 09/20/14 0545 09/20/14 1215 09/21/14 0945 09/24/14 0550  WBC 14.9* 20.7* 15.1* 26.9* 16.4* 13.4*  NEUTROABS 12.7* 18.2* 12.4*  --   --  9.2*  HGB 9.2* 7.8* 6.9* 7.2* 8.4* 8.7*  HCT 25.7* 22.0* 19.0* 20.2* 23.6* 24.9*  MCV 86.5 85.3 85.6 86.0 80.0 80.3  PLT 328 280 250 277 245 293    Time spent: 45 minutes  [name] A. Sickle Cell Service Pager [phone]  If 7PM-7AM, please contact night-coverage [email or web address] Password [facility] 09/24/2014, 3:51 PM

## 2014-09-24 NOTE — Progress Notes (Signed)
11:40  Patient refused monitoring at this time.  OBRR RN will return later to place EFM if patient will allow.

## 2014-09-25 LAB — CBC WITH DIFFERENTIAL/PLATELET
BAND NEUTROPHILS: 0 % (ref 0–10)
BLASTS: 0 %
Basophils Absolute: 0.1 10*3/uL (ref 0.0–0.1)
Basophils Relative: 1 % (ref 0–1)
Eosinophils Absolute: 0.6 10*3/uL (ref 0.0–0.7)
Eosinophils Relative: 4 % (ref 0–5)
HEMATOCRIT: 24.7 % — AB (ref 36.0–46.0)
Hemoglobin: 8.5 g/dL — ABNORMAL LOW (ref 12.0–15.0)
Lymphocytes Relative: 22 % (ref 12–46)
Lymphs Abs: 3.1 10*3/uL (ref 0.7–4.0)
MCH: 27.8 pg (ref 26.0–34.0)
MCHC: 34.4 g/dL (ref 30.0–36.0)
MCV: 80.7 fL (ref 78.0–100.0)
MONO ABS: 1.3 10*3/uL — AB (ref 0.1–1.0)
MONOS PCT: 9 % (ref 3–12)
Metamyelocytes Relative: 0 %
Myelocytes: 0 %
NRBC: 9 /100{WBCs} — AB
Neutro Abs: 9.1 10*3/uL — ABNORMAL HIGH (ref 1.7–7.7)
Neutrophils Relative %: 64 % (ref 43–77)
PLATELETS: 316 10*3/uL (ref 150–400)
PROMYELOCYTES ABS: 0 %
RBC: 3.06 MIL/uL — ABNORMAL LOW (ref 3.87–5.11)
RDW: 21.6 % — AB (ref 11.5–15.5)
WBC: 14.2 10*3/uL — AB (ref 4.0–10.5)

## 2014-09-25 NOTE — Progress Notes (Signed)
Encouraged to ambulate, refused will do it later.

## 2014-09-25 NOTE — Progress Notes (Signed)
CSW attempted to meet with patient today to offer support.  Patient states she is not feeling well and does not wish to talk at this time.  Patient requests CSW return tomorrow.

## 2014-09-25 NOTE — Progress Notes (Signed)
FACULTY PRACTICE ANTEPARTUM NOTE  [name] is a 24 y.o. G2P0010 at [redacted]w[redacted]d  who is admitted for sickle cell crisi.   Fetal presentation is unsure.  Dr Mcarthur Rossetti saw patient this am and is not weaning her PCA yet May bring up possibility of fentanyl patches for transition Length of Stay:  6  Days  Subjective:  Patient feels a little less achy with the addition of ibuprofen last night.  Was up and moving around room a little yesterday.  Using incentive spirometer - still a little SOB.  Denies fever, chills, nausea, vomiting.  Patient reports good fetal movement.  She reports no uterine contractions, no bleeding and no loss of fluid per vagina.  Vitals:  Blood pressure 102/51, pulse 96, temperature 98.4 F (36.9 C), temperature source Oral, resp. rate 15, height 5\' 2"  (1.575 m), weight 170 lb 1.6 oz (77.157 kg), last menstrual period 03/20/2014, SpO2 90 %. Physical Examination:  General appearance - alert, well appearing, and in no distress Chest - clear to auscultation, no wheezes, rales or rhonchi, symmetric air entry Heart - normal rate, regular rhythm, normal S1, S2, no murmurs, rubs, clicks or gallops Abdomen - soft, nontender, nondistended, no masses or organomegaly Skin - normal coloration and turgor, no rashes, no suspicious skin lesions noted Fundal Height:  size equals dates Extremities: extremities normal, atraumatic, no cyanosis or edema  Membranes:intact  Fetal Monitoring:  Baseline: 140 bpm, Variability: Good {> 6 bpm), Accelerations: Non-reactive but appropriate for gestational age and Decelerations: Absent  Labs:  Results for orders placed or performed during the hospital encounter of 09/18/14 (from the past 24 hour(s))  Glucose tolerance, 1 hour   Collection Time: 09/24/14  4:25 PM  Result Value Ref Range   Glucose, 1 Hour GTT 103 70 - 140 mg/dL  CBC with Differential/Platelet   Collection Time: 09/25/14  5:45 AM  Result Value Ref Range   WBC 14.2 (H) 4.0 - 10.5  K/uL   RBC 3.06 (L) 3.87 - 5.11 MIL/uL   Hemoglobin 8.5 (L) 12.0 - 15.0 g/dL   HCT 24.7 (L) 36.0 - 46.0 %   MCV 80.7 78.0 - 100.0 fL   MCH 27.8 26.0 - 34.0 pg   MCHC 34.4 30.0 - 36.0 g/dL   RDW 21.6 (H) 11.5 - 15.5 %   Platelets 316 150 - 400 K/uL   Neutrophils Relative % PENDING 43 - 77 %   Neutro Abs PENDING 1.7 - 7.7 K/uL   Band Neutrophils PENDING 0 - 10 %   Lymphocytes Relative PENDING 12 - 46 %   Lymphs Abs PENDING 0.7 - 4.0 K/uL   Monocytes Relative PENDING 3 - 12 %   Monocytes Absolute PENDING 0.1 - 1.0 K/uL   Eosinophils Relative PENDING 0 - 5 %   Eosinophils Absolute PENDING 0.0 - 0.7 K/uL   Basophils Relative PENDING 0 - 1 %   Basophils Absolute PENDING 0.0 - 0.1 K/uL   WBC Morphology PENDING    RBC Morphology PENDING    Smear Review PENDING    nRBC PENDING 0 /100 WBC   Metamyelocytes Relative PENDING %   Myelocytes PENDING %   Promyelocytes Absolute PENDING %   Blasts PENDING %    Imaging Studies:    none  Medications:  Scheduled . enoxaparin (LOVENOX) injection  40 mg Subcutaneous Q24H  . famotidine  20 mg Oral Q12H   Or  . famotidine (PEPCID) IV  20 mg Intravenous Q12H  . feeding supplement (ENSURE ENLIVE)  237 mL Oral BID BM  . folic acid  2 mg Oral Daily  . HYDROmorphone PCA 0.3 mg/mL   Intravenous 6 times per day  . methadone  5 mg Oral QHS  . oxyCODONE  15 mg Oral Q4H  . potassium chloride  40 mEq Oral BID  . prenatal vitamin w/FE, FA  1 tablet Oral Q1200  . senna-docusate  1 tablet Oral BID   I have reviewed the patient's current medications.  ASSESSMENT: Patient Active Problem List   Diagnosis Date Noted  . Boil of vulva 09/09/2014  . Sickle cell crisis 09/07/2014  . 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 Supervision of high-risk pregnancy 06/11/2014  . Chronic pain 02/26/2013  . Anemia of chronic disease 10/05/2012  . 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 Mood swings 11/08/2011  . Migraines 11/08/2011  . Overweight(278.02)  05/24/2011  . GERD (gastroesophageal reflux disease) 02/17/2011  . Depression 01/06/2011  . Back pain 09/17/2010  . Sickle cell disease 01/08/2009  . 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 IUP at [redacted]w[redacted]d  2.  Sickle Cell Crisis:  Being follow hematology 3.  Fever 4.  Leukocytosis  NST appropriate for gestational age - reassuring. Fundal height appropriate. Continue NST twice daily.  Continue pain control for Sickle cell Crisis. Ibuprofen for 48 hours started last night - discussed with patient that can only use this for a limited duration and that she should not take it at home.  Antibiotics stopped last night - no rebound fever.  Likely part of sickle cell crisis.  Blood cultures negative.   Continue routine antenatal care.   Florian Buff, MD 09/25/2014,7:39 AM

## 2014-09-25 NOTE — Progress Notes (Signed)
Encouraged [name] to walk.  Wanted to do it later

## 2014-09-25 NOTE — Progress Notes (Signed)
Dr Mcarthur Rossetti saw [name] this morning.  Plan is to not change or d/c PCA.   Patient will continue to self wean off pca and IV push for breakthrough pain.  Patient encouraged to walk. Plan is to have NST at 930 then to walk

## 2014-09-25 NOTE — Plan of Care (Signed)
Problem: Phase I Progression Outcomes Goal: Call (SCDAP) [facility] Outcome: Not Applicable Date Met:  67/12/45 Already follow

## 2014-09-25 NOTE — Progress Notes (Signed)
Again encouraged [name] to walk.  I will after I eat, then I will in alittle while.  Robe, socks given to patient to walk.

## 2014-09-25 NOTE — Consult Note (Signed)
[facility] Medical Consultation  [name] WYO:[id number] DOB: [birth year] DOA: 09/18/2014 PCP: [name]., MD   Requesting physician: Dr. Nehemiah Settle Date of follow up consultation: 09/23/2014 Reason for consultation: Sickle Cell Criis  Impression/Recommendations Principal Problem: 1. Hb SS with crisis: Patient has had the "best day" of her life. She was able to sleep and function better. On Dilaudid PCA and has used 31 mg in the last 24 hours. Also has her scheduled oral medications. Will keep on current regimen till tomorrow when we can transition patient to oral meds in the am and discharge home in the afternoon.  2. Leukocytosis: Improved. Continue monitoring.  3. Fever: Resolved  4. Hypoxemia: Resolved.  II recommend Discharge tomorrow on oral medications. Will discontinue her PCA in the morning and can be discahrged early afternoon.  Subjective: Pt rates pain 6/10 localized to her low back and buttocks. This is much better than yesterday.     Past Medical History  Diagnosis Date  . Miscarriage 03/22/2011    Pt reports 2 miscarriages.  . Depression 01/06/2011  . GERD (gastroesophageal reflux disease) 02/17/2011  . Trichotillomania     h/o  . Blood transfusion     "lots"  . Sickle cell anemia with crisis   . Exertional dyspnea     "sometimes"  . Migraines 11/08/11    "@ least twice/month"  . Chronic back pain     "very severe; have knot in my back; from tight muscle; take RX and exercise for it"  . Mood swings 11/08/11    "I go back and forth; real bad"  . Sickle cell anemia    Past Surgical History  Procedure Laterality Date  . Cholecystectomy  05/2010  . Dilation and curettage of uterus  02/20/11    S/P miscarriage   Social History:  reports that she quit smoking about 18 months ago. Her smoking use included Cigarettes. She has a .25 pack-year smoking history. She has never used smokeless tobacco. She reports that she does not drink alcohol or use  illicit drugs.  Allergies  Allergen Reactions  . Carrot Oil Hives and Swelling  . Latex Rash   Family History  Problem Relation Age of Onset  . Alcoholism    . Depression    . Hypercholesterolemia    . Hypertension    . Migraines    . Diabetes Maternal Grandmother   . Diabetes Paternal Grandmother   . Diabetes Maternal Grandfather   . Sickle cell trait Mother   . Sickle cell trait Father     Prior to Admission medications   Medication Sig Start Date End Date Taking? Authorizing Provider  folic acid (FOLVITE) 1 MG tablet Take 2 tablets (2 mg total) by mouth daily. 04/24/14  Yes Dorena Dew, FNP  methadone (DOLOPHINE) 5 MG tablet Take 1 tablet (5 mg total) by mouth at bedtime. 08/26/14  Yes [name], MD  oxyCODONE (ROXICODONE) 15 MG immediate release tablet Take 1 tablet (15 mg total) by mouth every 4 (four) hours as needed for pain. 09/08/14  Yes [name], MD  Prenatal Vit-Min-FA-Fish Oil ([employer] PRENATAL GUMMY) 0.4-113.5 MG CHEW Chew 2 each by mouth daily.   Yes Historical Provider, MD  acetaminophen (TYLENOL) 500 MG tablet Take 1,000 mg by mouth every 6 (six) hours as needed for moderate pain or headache.    Historical Provider, MD   Physical Exam: Blood pressure 109/59, pulse 98, temperature 98.7 F (37.1 C), temperature source Oral, resp. rate  15, height 5\' 2"  (1.575 m), weight 77.157 kg (170 lb 1.6 oz), last menstrual period 03/20/2014, SpO2 90 %. Filed Vitals:   09/25/14 1000 09/25/14 1040 09/25/14 1100 09/25/14 1323  BP:    109/59  Pulse:  91 97 98  Temp:    98.7 F (37.1 C)  TempSrc:    Oral  Resp:      Height:      Weight:      SpO2: 90% 90% 91% 90%   General: Alert, awake, oriented x3, in mild distress.  HEENT: [place]/AT PEERL, EOMI, anicteric Neck: Trachea midline, no masses, no thyromegal,y no JVD, no carotid bruit OROPHARYNX: Moist, No exudate/ erythema/lesions.  Heart: Regular rate and rhythm, without murmurs, rubs, gallops or S3. PMI  non-displaced. Exam reveals no decreased pulses. Pulmonary/Chest: Normal effort. Breath sounds normal. No. Apnea. Clear to auscultation,no stridor,  no wheezing and no rhonchi noted. No respiratory distress and no tenderness noted. Abdomen: Gravid abdomen. Neuro: Alert and oriented to person, place and time. Normal motor skills, Displays no atrophy or tremors and exhibits normal muscle tone.  No focal neurological deficits noted cranial nerves II through XII grossly intact. No sensory deficit noted. DTRs 2+ bilaterally upper and lower extremities. Strength at baseline in bilateral upper and lower extremities. Gait normal. Musculoskeletal: No warm swelling or erythema around joints, no spinal tenderness noted. Psychiatric: Patient alert and oriented x3, good insight and cognition, good recent to remote recall. Lymph node survey: No cervical axillary or inguinal lymphadenopathy noted. Skin: Skin is warm and dry. No bruising, no ecchymosis and no rash noted. Pt is not diaphoretic. No erythema. No pallor Psychiatric: Mood, memory, affect and judgement normal   Labs on Admission:  Basic Metabolic Panel:  Recent Labs Lab 09/19/14 0620 09/20/14 0545 09/21/14 0945 09/24/14 0550  NA 133* 137 135 142  K 3.5 3.6 3.0* 4.3  CL 108 110 109 110  CO2 22 23 22 26   GLUCOSE 105* 94 100* 93  BUN <5* <5* <5* 8  CREATININE 0.45 0.47 0.41* 0.46  CALCIUM 8.0* 8.0* 7.9* 8.6*  MG 1.5*  --   --  1.8   Liver Function Tests:  Recent Labs Lab 09/19/14 0620  AST 23  ALT 17  ALKPHOS 53  BILITOT 2.4*  PROT 6.2*  ALBUMIN 3.2*   No results for input(s): LIPASE, AMYLASE in the last 168 hours. No results for input(s): AMMONIA in the last 168 hours. CBC:  Recent Labs Lab 09/19/14 0620 09/20/14 0545 09/20/14 1215 09/21/14 0945 09/24/14 0550 09/25/14 0545  WBC 20.7* 15.1* 26.9* 16.4* 13.4* 14.2*  NEUTROABS 18.2* 12.4*  --   --  9.2* 9.1*  HGB 7.8* 6.9* 7.2* 8.4* 8.7* 8.5*  HCT 22.0* 19.0* 20.2*  23.6* 24.9* 24.7*  MCV 85.3 85.6 86.0 80.0 80.3 80.7  PLT 280 250 277 245 293 316    Time spent: 45 minutes  GARBA,LAWAL Sickle Cell Service Pager [phone]  If 7PM-7AM, please contact night-coverage [email or web address] Password [facility] 09/25/2014, 2:32 PM

## 2014-09-25 NOTE — Plan of Care (Signed)
Problem: Phase I Progression Outcomes Goal: [facility] [phone]) Outcome: Not Applicable Date Met:  88/32/54 Has a primary Sickle Cell Dr     Problem: Phase III Progression Outcomes Goal: IV fluids wean to po Outcome: Completed/Met Date Met:  09/25/14 KVO only to keep PICC line open

## 2014-09-25 NOTE — Progress Notes (Signed)
Patient hurting to bad to ambulate

## 2014-09-26 ENCOUNTER — Telehealth: Payer: Self-pay | Admitting: Internal Medicine

## 2014-09-26 DIAGNOSIS — D571 Sickle-cell disease without crisis: Secondary | ICD-10-CM

## 2014-09-26 DIAGNOSIS — G8929 Other chronic pain: Secondary | ICD-10-CM

## 2014-09-26 LAB — CULTURE, BLOOD (ROUTINE X 2)
CULTURE: NO GROWTH
Culture: NO GROWTH

## 2014-09-26 MED ORDER — OXYCODONE HCL 15 MG PO TABS: 15.0000 mg | ORAL_TABLET | ORAL | Status: DC | PRN

## 2014-09-26 MED ORDER — HEPARIN SOD (PORK) LOCK FLUSH 100 UNIT/ML IV SOLN
250.0000 [IU] | INTRAVENOUS | Status: AC | PRN
  Administered 2014-09-26 (×2): 250 [IU]
  Filled 2014-09-26: qty 3

## 2014-09-26 MED ORDER — METHADONE HCL 5 MG PO TABS: 5.0000 mg | ORAL_TABLET | Freq: Every day | ORAL | Status: DC

## 2014-09-26 NOTE — Telephone Encounter (Signed)
Prescription written for  Oxycodone 15 mg # 90 tabs and Methadone 5 mg #30 tabs. [facility] reviewed and no inconsistencies noted. Weaning of medication to be discussed after pt delivers baby.

## 2014-09-26 NOTE — Discharge Summary (Signed)
Physician Discharge Summary  Patient ID: [name] MRN: [id number] DOB/AGE: Jan 02, 1991 24 y.o.  Admit date: 09/18/2014 Discharge date: 09/26/2014  Admission Diagnoses:  Discharge Diagnoses:  Principal Problem:   Sickle cell crisis Active Problems:   Chronic pain   Supervision of high-risk pregnancy   Discharged Condition: good  Hospital Course:  Expand All Collapse All   [facility] PRACTICE ANTEPARTUM ADMISSION HISTORY AND PHYSICAL NOTE   History of Present Illness: [name] is a 24 y.o. G2P0010 at [redacted]w[redacted]d admitted for Sickle cell crises. Pt reports that she has had pain in her back for 24 hours. She took 2 doses of oxycontin 12 hours apart with no relief of sx. She reports that she feels that she needs to be admitted. She states that she has no other pain at present. She says that with the dilaudid her pain is 4/10 but, she does not feel that she can be controlled as an outpt. She has no fetal concerns. Patient reports the fetal movement as active. Patient reports uterine contraction activity as none. Patient reports vaginal bleeding as none. Patient describes fluid per vagina as None. Fetal presentation is unsure.  Patient Active Problem List   Diagnosis Date Noted  . Boil of vulva 09/09/2014  . Sickle cell crisis 09/07/2014  . 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 Supervision of high-risk pregnancy 06/11/2014  . Chronic pain 02/26/2013  . Anemia of chronic disease 10/05/2012  . 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 Mood swings 11/08/2011  . Migraines 11/08/2011  . Overweight(278.02) 05/24/2011  . GERD (gastroesophageal reflux disease) 02/17/2011  . Depression 01/06/2011  . Back pain 09/17/2010  . Sickle cell disease 01/08/2009  . 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 Miscarriage 03/22/2011    Pt reports 2 miscarriages.  . Depression  01/06/2011  . GERD (gastroesophageal reflux disease) 02/17/2011  . Trichotillomania     h/o  . Blood transfusion     "lots"  . Sickle cell anemia with crisis   . Exertional dyspnea     "sometimes"  . Migraines 11/08/11    "@ least twice/month"  . Chronic back pain     "very severe; have knot in my back; from tight muscle; take RX and exercise for it"  . Mood swings 11/08/11    "I go back and forth; real bad"  . Sickle cell anemia     Past Surgical History  Procedure Laterality Date  . Cholecystectomy  05/2010  . Dilation and curettage of uterus  02/20/11    S/P miscarriage    OB History  Gravida Para Term Preterm AB SAB TAB Ectopic Multiple Living  2    1 1         # Outcome Date GA Lbr Len/2nd Weight Sex Delivery Anes PTL Lv  2 Current           1 SAB 02/20/11           Comments: System Generated. Please review and update pregnancy details.    Obstetric Comments  Miscarried in October 2012 at about 7 weeks    History   Social History  . Marital Status: Single    Spouse Name: N/A  . Number of Children: N/A  . Years of Education: N/A   Social History Main Topics  . Smoking status: Former Smoker -- 0.25 packs/day for 1 years    Types: Cigarettes    Quit date: 03/25/2013  . Smokeless tobacco:  Never Used  . Alcohol Use: No     Comment: pt states she quit marijuan in May 2013. Rare ETOH, + cigarettes. She is enrolled in school  . Drug Use: No  . Sexual Activity: Not Currently    Birth Control/ Protection: None   Other Topics Concern  . 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 Alcoholism    . Depression    .  Hypercholesterolemia    . Hypertension    . Migraines    . Diabetes Maternal Grandmother   . Diabetes Paternal Grandmother   . Diabetes Maternal Grandfather   . Sickle cell trait Mother   . Sickle cell trait Father     Allergies  Allergen Reactions  . Carrot Oil Hives and Swelling  . Latex Rash    Prescriptions prior to admission  Medication Sig Dispense Refill Last Dose  . folic acid (FOLVITE) 1 MG tablet Take 2 tablets (2 mg total) by mouth daily. 60 tablet 2 09/17/2014 at Unknown time  . methadone (DOLOPHINE) 5 MG tablet Take 1 tablet (5 mg total) by mouth at bedtime. 30 tablet 0 09/17/2014 at Unknown time  . oxyCODONE (ROXICODONE) 15 MG immediate release tablet Take 1 tablet (15 mg total) by mouth every 4 (four) hours as needed for pain. 90 tablet 0 09/18/2014 at Unknown time  . Prenatal Vit-Min-FA-Fish Oil ([employer] PRENATAL GUMMY) 0.4-113.5 MG CHEW Chew 2 each by mouth daily.   09/17/2014 at Unknown time  . acetaminophen (TYLENOL) 500 MG tablet Take 1,000 mg by mouth every 6 (six) hours as needed for moderate pain or headache.   prn    Review of Systems - Negative except pain in sacrum. has not had pain crises here since high school.  Vitals: BP 104/64 mmHg  Pulse 94  Temp(Src) 98.6 F (37 C)  Resp 18  SpO2 100%  LMP 03/20/2014 Physical Examination: GENERAL: Well-developed, well-nourished female in no acute distress. Appears uncomfortable LUNGS: Clear to auscultation bilaterally.  HEART: Regular rate and rhythm. ABDOMEN: Soft, nontender, nondistended. No organomegaly. EXTREMITIES: No cyanosis, clubbing, or edema CERVIX: Evaluated by digital exam. and found to be closed/thick and high done by midwife)   Membranes:intact Fetal Monitoring:Baseline: 150's bpm, Variability: Good {> 6 bpm) and Accelerations: Reactive Tocometer: Flat  Labs:  Results for orders placed or performed during the hospital  encounter of 09/18/14 (from the past 24 hour(s))  CBC with Differential/Platelet   Collection Time: 09/18/14 11:30 AM  Result Value Ref Range   WBC 14.9 (H) 4.0 - 10.5 K/uL   RBC 2.97 (L) 3.87 - 5.11 MIL/uL   Hemoglobin 9.2 (L) 12.0 - 15.0 g/dL   HCT 25.7 (L) 36.0 - 46.0 %   MCV 86.5 78.0 - 100.0 fL   MCH 31.0 26.0 - 34.0 pg   MCHC 35.8 30.0 - 36.0 g/dL   RDW 16.7 (H) 11.5 - 15.5 %   Platelets 328 150 - 400 K/uL   Neutrophils Relative % 85 (H) 43 - 77 %   Lymphocytes Relative 10 (L) 12 - 46 %   Monocytes Relative 5 3 - 12 %   Eosinophils Relative 0 0 - 5 %   Basophils Relative 0 0 - 1 %   Neutro Abs 12.7 (H) 1.7 - 7.7 K/uL   Lymphs Abs  1.5 0.7 - 4.0 K/uL   Monocytes Absolute 0.7 0.1 - 1.0 K/uL   Eosinophils Absolute 0.0 0.0 - 0.7 K/uL   Basophils Absolute 0.0 0.0 - 0.1 K/uL   RBC Morphology SICKLE CELLS   Reticulocytes   Collection Time: 09/18/14 11:30 AM  Result Value Ref Range   Retic Ct Pct 15.6 (H) 0.4 - 3.1 %   RBC. 2.97 (L) 3.87 - 5.11 MIL/uL   Retic Count, Manual 463.3 (H) 19.0 - 186.0 K/uL  Comprehensive metabolic panel   Collection Time: 09/18/14 11:30 AM  Result Value Ref Range   Sodium 135 135 - 145 mmol/L   Potassium 3.7 3.5 - 5.1 mmol/L   Chloride 109 101 - 111 mmol/L   CO2 22 22 - 32 mmol/L   Glucose, Bld 92 70 - 99 mg/dL   BUN <5 (L) 6 - 20 mg/dL   Creatinine, Ser 0.38 (L) 0.44 - 1.00 mg/dL   Calcium 8.5 (L) 8.9 - 10.3 mg/dL   Total Protein 6.7 6.5 - 8.1 g/dL   Albumin 3.3 (L) 3.5 - 5.0 g/dL   AST 21 15 - 41 U/L   ALT 18 14 - 54 U/L   Alkaline Phosphatase 51 38 - 126 U/L   Total Bilirubin 1.3 (H) 0.3 - 1.2 mg/dL   GFR calc non Af Amer >60 >60 mL/min   GFR calc Af Amer >60 >60 mL/min   Anion gap 4 (L) 5 - 15  Fetal fibronectin   Collection Time: 09/18/14 1:40 PM  Result  Value Ref Range   Fetal Fibronectin NEGATIVE NEGATIVE    Imaging Studies:  Imaging Results    US Ob Follow Up  09/17/2014 OBSTETRICAL ULTRASOUND: This exam was performed within a [facility]. The OB US report was generated in the [facility] system, and faxed to the ordering physician. This report is available in the [facility]. See the [facility] Obstetric US report via the Image Link.  Ir Fluoro Guide Cv Line Left  09/04/2014 CLINICAL DATA: Sickle cell disease, pregnant, needs IV access EXAM: PICC PLACEMENT WITH ULTRASOUND AND FLUOROSCOPY FLUOROSCOPY TIME: 0.1 minutes, 0.4 mGy TECHNIQUE: After written informed consent was obtained, patient was placed in the supine position on angiographic table. Patency of the left brachial vein was confirmed with ultrasound with image documentation. An appropriate skin site was determined. Skin site was marked. Region was prepped using maximum barrier technique including cap and mask, sterile gown, sterile gloves, large sterile sheet, and Chlorhexidine as cutaneous antisepsis. The region was infiltrated locally with 1% lidocaine. Under real-time ultrasound guidance, the left brachial vein was accessed with a 21 gauge micropuncture needle; the needle tip within the vein was confirmed with ultrasound image documentation. Needle exchanged over a 018 guidewire for a peel-away sheath, through which a 5-French double-lumen power injectable PICC trimmed to 38cm was advanced, positioned with its tip near the cavoatrial junction. Spot chest radiograph confirms appropriate catheter position. Catheter was flushed per protocol and secured externally. The patient tolerated procedure well. COMPLICATIONS: COMPLICATIONS none IMPRESSION: 1. Technically successful five [place] double lumen power injectable PICC placement Electronically Signed By: Lucrezia Europe M.D. On: 09/04/2014 12:42   Ir US Guide Vasc Access Left  09/04/2014 CLINICAL DATA: Sickle cell  disease, pregnant, needs IV access EXAM: PICC PLACEMENT WITH ULTRASOUND AND FLUOROSCOPY FLUOROSCOPY TIME: 0.1 minutes, 0.4 mGy TECHNIQUE: After written informed consent was obtained, patient was placed in the supine position on angiographic table. Patency of the left brachial vein was confirmed with ultrasound  with image documentation. An appropriate skin site was determined. Skin site was marked. Region was prepped using maximum barrier technique including cap and mask, sterile gown, sterile gloves, large sterile sheet, and Chlorhexidine as cutaneous antisepsis. The region was infiltrated locally with 1% lidocaine. Under real-time ultrasound guidance, the left brachial vein was accessed with a 21 gauge micropuncture needle; the needle tip within the vein was confirmed with ultrasound image documentation. Needle exchanged over a 018 guidewire for a peel-away sheath, through which a 5-French double-lumen power injectable PICC trimmed to 38cm was advanced, positioned with its tip near the cavoatrial junction. Spot chest radiograph confirms appropriate catheter position. Catheter was flushed per protocol and secured externally. The patient tolerated procedure well. COMPLICATIONS: COMPLICATIONS none IMPRESSION: 1. Technically successful five [place] double lumen power injectable PICC placement Electronically Signed By: Lucrezia Europe M.D. On: 09/04/2014 12:42      Prenatal Transfer Tool  Maternal Diabetes: No Genetic Screening: Normal Maternal Ultrasounds/Referrals: Normal Fetal Ultrasounds or other Referrals: None Maternal Substance Abuse: No Significant Maternal Medications: Meds include: Other:  Methadone; oxycontin Significant Maternal Lab Results: Lab values include: Other: anemia   Assessment and Plan: Patient Active Problem List   Diagnosis Date Noted  . Boil of vulva 09/09/2014  . Sickle cell crisis 09/07/2014  . 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 Supervision  of high-risk pregnancy 06/11/2014  . Chronic pain 02/26/2013  . Anemia of chronic disease 10/05/2012  . 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 Mood swings 11/08/2011  . Migraines 11/08/2011  . Overweight(278.02) 05/24/2011  . GERD (gastroesophageal reflux disease) 02/17/2011  . Depression 01/06/2011  . Back pain 09/17/2010  . Sickle cell disease 01/08/2009  . 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 [name], M.D., [place]      Patient was admitted with above H&P, treated for sickle cell crisis with PCA dilaudid and IV boluses, IV vanc and Zosyn for possible PICC line infection, ABX DC when blood culture was negative. [facility] was consulted and gave guidance for pain management. When her pain improved she was switched back to her home medications and discharged 09/26/14  Consults: Critical care and sickle cell medicine  Significant Diagnostic Studies: microbiology: blood culture: negative and radiology: CXR: normal  Treatments: IV hydration and antibiotics: vancomycin, Zosyn  Discharge Exam: Blood pressure 106/73, pulse 91, temperature 98.3 F (36.8 C), temperature source Oral, resp. rate 17, height 5\' 2"  (1.575 m), weight 170 lb 1.6 oz (77.157 kg), last menstrual period 03/20/2014, SpO2 100 %. General appearance: alert, cooperative and no distress GI: soft, non-tender; bowel sounds normal; no masses,  no organomegaly Extremities: extremities normal, atraumatic, no cyanosis or edema  Disposition: 01-Home or Self Care F/U [facility] 1 week, sickle cell medicine     Medication List    TAKE these medications        acetaminophen 500 MG tablet  Commonly known as:  TYLENOL  Take 1,000 mg by mouth every 6 (six) hours as needed for moderate pain or headache.      [employer] PRENATAL GUMMY 0.4-113.5 MG Chew  Chew 2 each by mouth daily.     folic acid 1 MG tablet  Commonly known as:  FOLVITE  Take 2 tablets (2 mg total) by mouth daily.     methadone 5 MG tablet  Commonly known as:  DOLOPHINE  Take 1 tablet (5 mg total) by mouth at bedtime.     oxyCODONE 15 MG immediate release tablet  Commonly known as:  ROXICODONE  Take 1 tablet (15 mg total) by mouth every 4 (four) hours as needed for pain.         Signed: Ezio Wieck 09/26/2014, 9:27 AM

## 2014-09-26 NOTE — Discharge Instructions (Signed)
Sickle Cell Anemia, Adult Sickle cell anemia is a condition in which red blood cells have an abnormal "sickle" shape. This abnormal shape shortens the cells' life span, which results in a lower than normal concentration of red blood cells in the blood. The sickle shape also causes the cells to clump together and block free blood flow through the blood vessels. As a result, the tissues and organs of the body do not receive enough oxygen. Sickle cell anemia causes organ damage and pain and increases the risk of infection. CAUSES  Sickle cell anemia is a genetic disorder. Those who receive two copies of the gene have the condition, and those who receive one copy have the trait. RISK FACTORS The sickle cell gene is most common in people whose families originated in [place]. Other areas of the globe where sickle cell trait occurs include the [place], [place] and [place], and the [place].  SIGNS AND SYMPTOMS  Pain, especially in the extremities, back, chest, or abdomen (common). The pain may start suddenly or may develop following an illness, especially if there is dehydration. Pain can also occur due to overexertion or exposure to extreme temperature changes.  Frequent severe bacterial infections, especially certain types of pneumonia and meningitis.  Pain and swelling in the hands and feet.  Decreased activity.   Loss of appetite.   Change in behavior.  Headaches.  Seizures.  Shortness of breath or difficulty breathing.  Vision changes.  Skin ulcers. Those with the trait may not have symptoms or they may have mild symptoms.  DIAGNOSIS  Sickle cell anemia is diagnosed with blood tests that demonstrate the genetic trait. It is often diagnosed during the newborn period, due to mandatory testing nationwide. A variety of blood tests, X-rays, CT scans, MRI scans, ultrasounds, and lung function tests may also be done to monitor the condition. TREATMENT  Sickle  cell anemia may be treated with: 1. Medicines. You may be given pain medicines, antibiotic medicines (to treat and prevent infections) or medicines to increase the production of certain types of hemoglobin. 2. Fluids. 3. Oxygen. 4. Blood transfusions. HOME CARE INSTRUCTIONS   Drink enough fluid to keep your urine clear or pale yellow. Increase your fluid intake in hot weather and during exercise.  Do not smoke. Smoking lowers oxygen levels in the blood.   Only take over-the-counter or prescription medicines for pain, fever, or discomfort as directed by your health care provider.  Take antibiotics as directed by your health care provider. Make sure you finish them it even if you start to feel better.   Take supplements as directed by your health care provider.   Consider wearing a medical alert bracelet. This tells anyone caring for you in an emergency of your condition.   When traveling, keep your medical information, health care provider's names, and the medicines you take with you at all times.   If you develop a fever, do not take medicines to reduce the fever right away. This could cover up a problem that is developing. Notify your health care provider.  Keep all follow-up appointments with your health care provider. Sickle cell anemia requires regular medical care. SEEK MEDICAL CARE IF: You have a fever. SEEK IMMEDIATE MEDICAL CARE IF:   You feel dizzy or faint.   You have new abdominal pain, especially on the left side near the stomach area.   You develop a persistent, often uncomfortable and painful penile erection (priapism). If this is not treated immediately it  will lead to impotence.   You have numbness your arms or legs or you have a hard time moving them.   You have a hard time with speech.   You have a fever or persistent symptoms for more than 2-3 days.   You have a fever and your symptoms suddenly get worse.   You have signs or symptoms of  infection. These include:   Chills.   Abnormal tiredness (lethargy).   Irritability.   Poor eating.   Vomiting.   You develop pain that is not helped with medicine.   You develop shortness of breath.  You have pain in your chest.   You are coughing up pus-like or bloody sputum.   You develop a stiff neck.  Your feet or hands swell or have pain.  Your abdomen appears bloated.  You develop joint pain. MAKE SURE YOU:  Understand these instructions. Document Released: 08/10/2005 Document Revised: 09/16/2013 Document Reviewed: 12/12/2012 [facility] Patient Information 2015 [place], [place]. This information is not intended to replace advice given to you by your health care provider. Make sure you discuss any questions you have with your health care provider.  Fetal Movement Counts Patient Name: __________________________________________________ Patient Due Date: ____________________ Performing a fetal movement count is highly recommended in high-risk pregnancies, but it is good for every pregnant woman to do. Your health care provider may ask you to start counting fetal movements at 28 weeks of the pregnancy. Fetal movements often increase:  After eating a full meal.  After physical activity.  After eating or drinking something sweet or cold.  At rest. Pay attention to when you feel the baby is most active. This will help you notice a pattern of your baby's sleep and wake cycles and what factors contribute to an increase in fetal movement. It is important to perform a fetal movement count at the same time each day when your baby is normally most active.  HOW TO COUNT FETAL MOVEMENTS 5. Find a quiet and comfortable area to sit or lie down on your left side. Lying on your left side provides the best blood and oxygen circulation to your baby. 6. Write down the day and time on a sheet of paper or in a journal. 7. Start counting kicks, flutters, swishes, rolls, or jabs in  a 2-hour period. You should feel at least 10 movements within 2 hours. 8. If you do not feel 10 movements in 2 hours, wait 2-3 hours and count again. Look for a change in the pattern or not enough counts in 2 hours. SEEK MEDICAL CARE IF:  You feel less than 10 counts in 2 hours, tried twice.  There is no movement in over an hour.  The pattern is changing or taking longer each day to reach 10 counts in 2 hours.  You feel the baby is not moving as he or she usually does. Date: ____________ Movements: ____________ Start time: ____________ [name] time: ____________  Date: ____________ Movements: ____________ Start time: ____________ [name] time: ____________ Date: ____________ Movements: ____________ Start time: ____________ [name] time: ____________ Date: ____________ Movements: ____________ Start time: ____________ [name] time: ____________ Date: ____________ Movements: ____________ Start time: ____________ [name] time: ____________ Date: ____________ Movements: ____________ Start time: ____________ [name] time: ____________ Date: ____________ Movements: ____________ Start time: ____________ [name] time: ____________ Date: ____________ Movements: ____________ Start time: ____________ [name] time: ____________  Date: ____________ Movements: ____________ Start time: ____________ [name] time: ____________ Date: ____________ Movements: ____________ Start time: ____________ [name] time: ____________ Date: ____________ Movements:  ____________ Start time: ____________ [name] time: ____________ Date: ____________ Movements: ____________ Start time: ____________ [name] time: ____________ Date: ____________ Movements: ____________ Start time: ____________ [name] time: ____________ Date: ____________ Movements: ____________ Start time: ____________ [name] time: ____________ Date: ____________ Movements: ____________ Start time: ____________ [name] time: ____________  Date: ____________ Movements:  ____________ Start time: ____________ [name] time: ____________ Date: ____________ Movements: ____________ Start time: ____________ [name] time: ____________ Date: ____________ Movements: ____________ Start time: ____________ [name] time: ____________ Date: ____________ Movements: ____________ Start time: ____________ [name] time: ____________ Date: ____________ Movements: ____________ Start time: ____________ [name] time: ____________ Date: ____________ Movements: ____________ Start time: ____________ [name] time: ____________ Date: ____________ Movements: ____________ Start time: ____________ [name] time: ____________  Date: ____________ Movements: ____________ Start time: ____________ [name] time: ____________ Date: ____________ Movements: ____________ Start time: ____________ [name] time: ____________ Date: ____________ Movements: ____________ Start time: ____________ [name] time: ____________ Date: ____________ Movements: ____________ Start time: ____________ [name] time: ____________ Date: ____________ Movements: ____________ Start time: ____________ [name] time: ____________ Date: ____________ Movements: ____________ Start time: ____________ [name] time: ____________ Date: ____________ Movements: ____________ Start time: ____________ [name] time: ____________  Date: ____________ Movements: ____________ Start time: ____________ [name] time: ____________ Date: ____________ Movements: ____________ Start time: ____________ [name] time: ____________ Date: ____________ Movements: ____________ Start time: ____________ [name] time: ____________ Date: ____________ Movements: ____________ Start time: ____________ [name] time: ____________ Date: ____________ Movements: ____________ Start time: ____________ [name] time: ____________ Date: ____________ Movements: ____________ Start time: ____________ [name] time: ____________ Date: ____________ Movements: ____________ Start time: ____________ [name]  time: ____________  Date: ____________ Movements: ____________ Start time: ____________ [name] time: ____________ Date: ____________ Movements: ____________ Start time: ____________ [name] time: ____________ Date: ____________ Movements: ____________ Start time: ____________ [name] time: ____________ Date: ____________ Movements: ____________ Start time: ____________ [name] time: ____________ Date: ____________ Movements: ____________ Start time: ____________ [name] time: ____________ Date: ____________ Movements: ____________ Start time: ____________ [name] time: ____________ Date: ____________ Movements: ____________ Start time: ____________ [name] time: ____________  Date: ____________ Movements: ____________ Start time: ____________ [name] time: ____________ Date: ____________ Movements: ____________ Start time: ____________ [name] time: ____________ Date: ____________ Movements: ____________ Start time: ____________ [name] time: ____________ Date: ____________ Movements: ____________ Start time: ____________ [name] time: ____________ Date: ____________ Movements: ____________ Start time: ____________ [name] time: ____________ Date: ____________ Movements: ____________ Start time: ____________ [name] time: ____________ Date: ____________ Movements: ____________ Start time: ____________ [name] time: ____________  Date: ____________ Movements: ____________ Start time: ____________ [name] time: ____________ Date: ____________ Movements: ____________ Start time: ____________ [name] time: ____________ Date: ____________ Movements: ____________ Start time: ____________ [name] time: ____________ Date: ____________ Movements: ____________ Start time: ____________ [name] time: ____________ Date: ____________ Movements: ____________ Start time: ____________ [name] time: ____________ Date: ____________ Movements: ____________ Start time: ____________ [name] time: ____________ Document Released:  06/01/2006 Document Revised: 09/16/2013 Document Reviewed: 02/27/2012 [employer] Patient Information 2015 [employer]. This information is not intended to replace advice given to you by your health care provider. Make sure you discuss any questions you have with your health care provider.

## 2014-09-26 NOTE — Telephone Encounter (Signed)
Refill request for oxycodone 15mg  and methadone 5mg . LOV 07/21/2014. Patient is being discharged from the hospital and is out of medication. She called this in Thursday 09/25/2014 on the voicemail. She was reminded of the 3 business day policy. Please advise. Thanks!

## 2014-10-07 ENCOUNTER — Ambulatory Visit (HOSPITAL_COMMUNITY)
Admission: RE | Admit: 2014-10-07 | Discharge: 2014-10-07 | Disposition: A | Discharge status: Home / Self Care | Payer: Medicare Other | Source: Ambulatory Visit | Attending: Internal Medicine | Admitting: Internal Medicine

## 2014-10-07 DIAGNOSIS — D5700 Hb-SS disease with crisis, unspecified: Secondary | ICD-10-CM | POA: Diagnosis not present

## 2014-10-07 MED ORDER — SODIUM CHLORIDE 0.9 % IJ SOLN
10.0000 mL | INTRAMUSCULAR | Status: AC | PRN
  Administered 2014-10-07: 10 mL

## 2014-10-07 MED ORDER — HEPARIN SOD (PORK) LOCK FLUSH 100 UNIT/ML IV SOLN
250.0000 [IU] | INTRAVENOUS | Status: AC | PRN
  Administered 2014-10-07 (×2): 250 [IU]
  Filled 2014-10-07: qty 5

## 2014-10-07 NOTE — Progress Notes (Signed)
[facility]  Procedure Note  [name] LKH:[id number] DOB: 04/23/1991 DOA: 10/07/2014   PCP: MATTHEWS,MICHELLE A., MD   Associated Diagnosis: Hgb SS without crisis; PICC line maintenance  Procedure Note: Pt arrived ambulatory, a&ox4. Pt in no distress. LUA PICC dressing changed   Condition During Procedure: Pt tolerated well. No drainage or signs of infection at PICC site.   Condition at Discharge: Pt discharged ambulatory. Pt a&ox4, pt in no distress. PICC dressing and dual ports changes, flushed with NS and packed with Heparin. Mesh net applied to Lt arm to stabilize PICC. Pt denies any further needs at this time.    Trixie Rude, RN  [facility]

## 2014-10-08 ENCOUNTER — Other Ambulatory Visit: Payer: Self-pay | Admitting: Family Medicine

## 2014-10-08 DIAGNOSIS — D5700 Hb-SS disease with crisis, unspecified: Secondary | ICD-10-CM

## 2014-10-09 ENCOUNTER — Encounter: Payer: Medicare Other | Admitting: Family Medicine

## 2014-10-09 ENCOUNTER — Other Ambulatory Visit: Payer: Self-pay | Admitting: Internal Medicine

## 2014-10-09 DIAGNOSIS — D571 Sickle-cell disease without crisis: Secondary | ICD-10-CM

## 2014-10-09 DIAGNOSIS — Z3492 Encounter for supervision of normal pregnancy, unspecified, second trimester: Secondary | ICD-10-CM

## 2014-10-09 NOTE — Progress Notes (Signed)
Pt has orders for standing CBC with diff and Hemoglobin Electrophoresis to be done throughout her pregnancy. This is as a part of her pregnancy management in the setting of sickle cell disease wit hteh goal of decreasing % of Hb S cells to <30%.

## 2014-10-10 ENCOUNTER — Encounter (HOSPITAL_COMMUNITY): Payer: Self-pay | Admitting: *Deleted

## 2014-10-10 ENCOUNTER — Inpatient Hospital Stay (HOSPITAL_COMMUNITY)
Admission: AD | Admit: 2014-10-10 | Discharge: 2014-10-14 | DRG: 781 | Disposition: A | Discharge status: Home / Self Care | Payer: Medicare Other | Source: Ambulatory Visit | Attending: Family Medicine | Admitting: Family Medicine

## 2014-10-10 DIAGNOSIS — D5700 Hb-SS disease with crisis, unspecified: Secondary | ICD-10-CM | POA: Diagnosis not present

## 2014-10-10 DIAGNOSIS — O99013 Anemia complicating pregnancy, third trimester: Principal | ICD-10-CM | POA: Diagnosis present

## 2014-10-10 DIAGNOSIS — O99613 Diseases of the digestive system complicating pregnancy, third trimester: Secondary | ICD-10-CM | POA: Diagnosis present

## 2014-10-10 DIAGNOSIS — O99019 Anemia complicating pregnancy, unspecified trimester: Secondary | ICD-10-CM | POA: Diagnosis not present

## 2014-10-10 DIAGNOSIS — Z3A29 29 weeks gestation of pregnancy: Secondary | ICD-10-CM | POA: Diagnosis present

## 2014-10-10 DIAGNOSIS — F329 Major depressive disorder, single episode, unspecified: Secondary | ICD-10-CM | POA: Diagnosis not present

## 2014-10-10 DIAGNOSIS — O99343 Other mental disorders complicating pregnancy, third trimester: Secondary | ICD-10-CM | POA: Diagnosis present

## 2014-10-10 DIAGNOSIS — D638 Anemia in other chronic diseases classified elsewhere: Secondary | ICD-10-CM | POA: Diagnosis present

## 2014-10-10 DIAGNOSIS — K219 Gastro-esophageal reflux disease without esophagitis: Secondary | ICD-10-CM | POA: Diagnosis present

## 2014-10-10 DIAGNOSIS — Z87891 Personal history of nicotine dependence: Secondary | ICD-10-CM

## 2014-10-10 DIAGNOSIS — O0990 Supervision of high risk pregnancy, unspecified, unspecified trimester: Secondary | ICD-10-CM

## 2014-10-10 DIAGNOSIS — F633 Trichotillomania: Secondary | ICD-10-CM | POA: Diagnosis present

## 2014-10-10 LAB — URINE MICROSCOPIC-ADD ON

## 2014-10-10 LAB — URINALYSIS, ROUTINE W REFLEX MICROSCOPIC
Bilirubin Urine: NEGATIVE
Glucose, UA: NEGATIVE mg/dL
Hgb urine dipstick: NEGATIVE
Ketones, ur: NEGATIVE mg/dL
Nitrite: NEGATIVE
Protein, ur: NEGATIVE mg/dL
Specific Gravity, Urine: 1.015 (ref 1.005–1.030)
Urobilinogen, UA: 1 mg/dL (ref 0.0–1.0)
pH: 6 (ref 5.0–8.0)

## 2014-10-10 IMAGING — CR DG CHEST 2V
2 series · 2 of 2 positions shown · non-contrast
Comparison: Portable chest x-ray September 23, 2013

CLINICAL DATA: Posterior left chest discomfort without shortness of
breath or cough; history of sickle cell disease

EXAM:
CHEST  2 VIEW

[w chest pa]
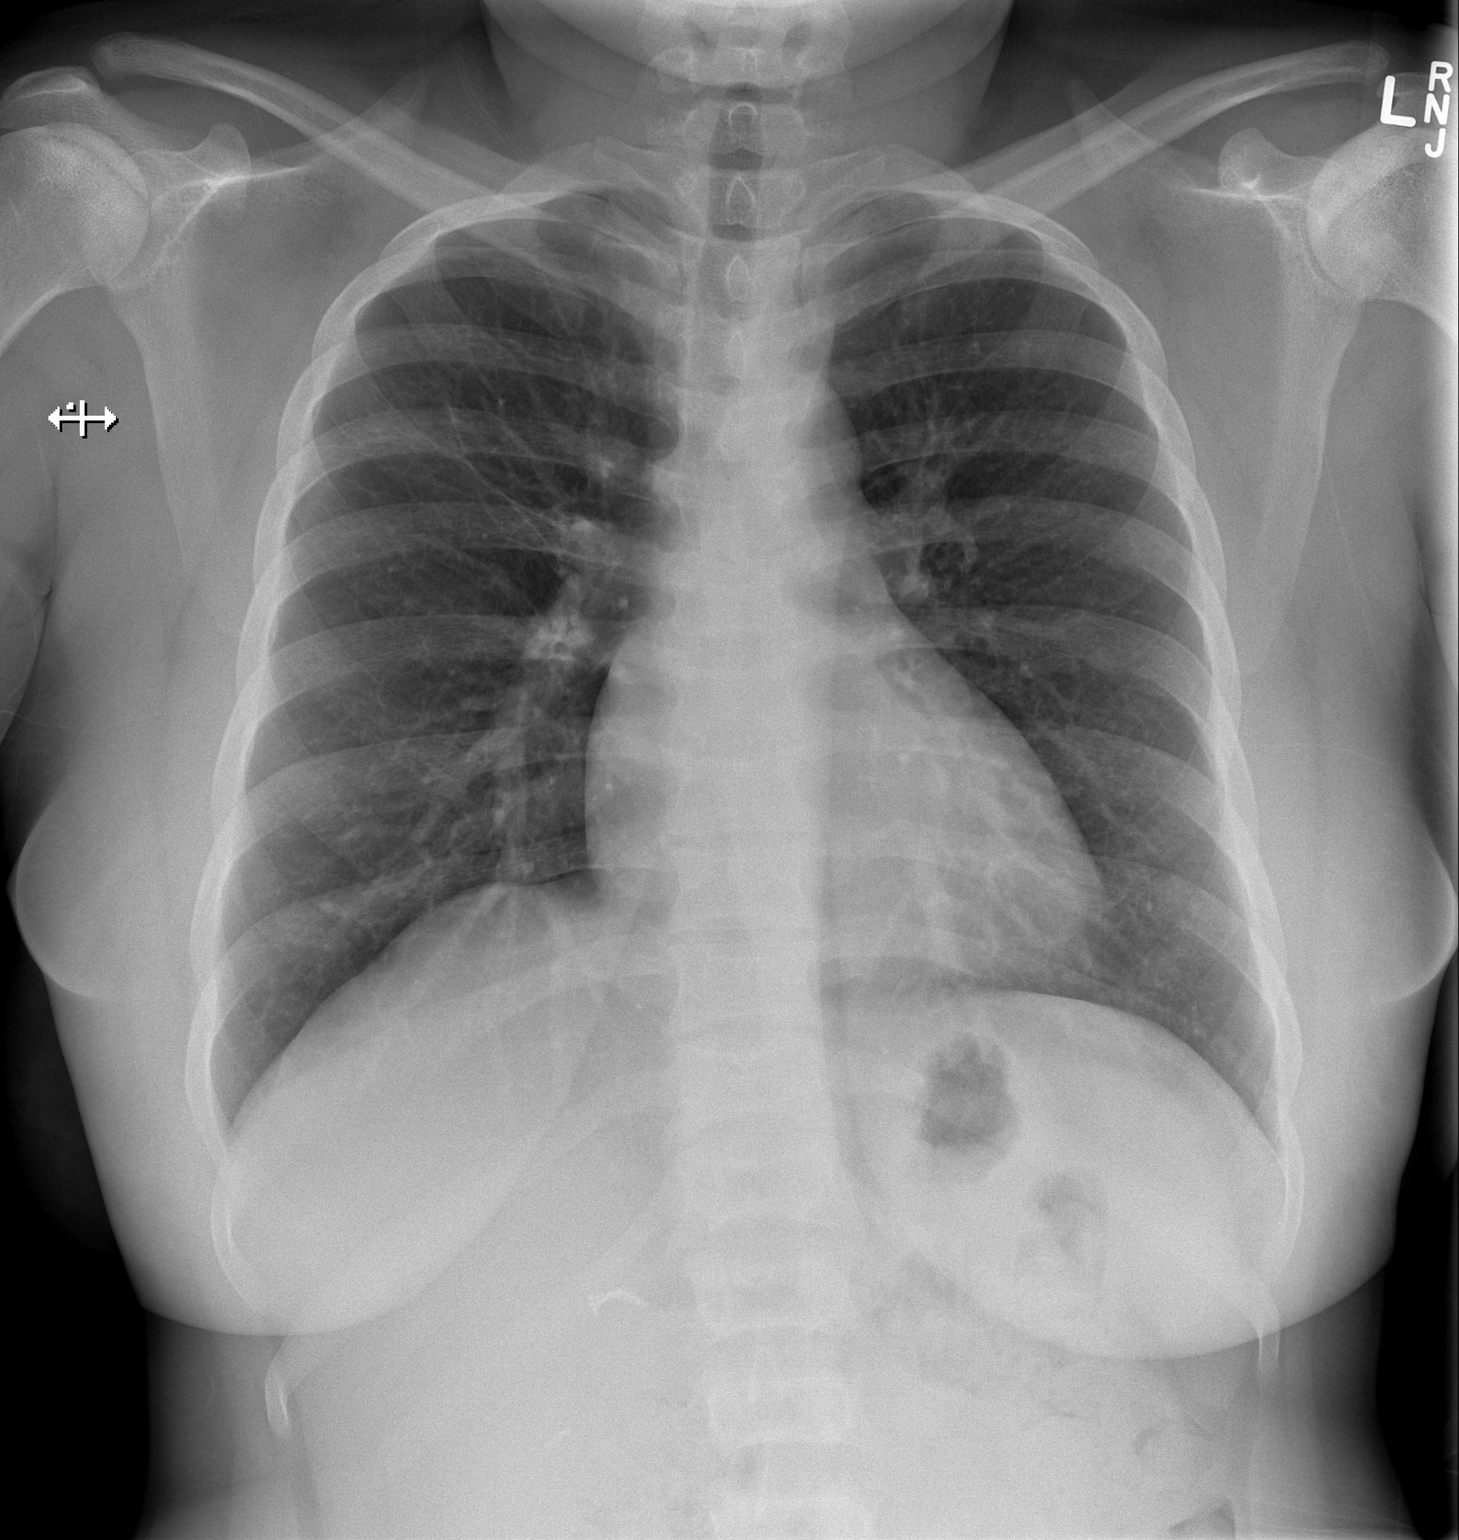

[w chest lat]
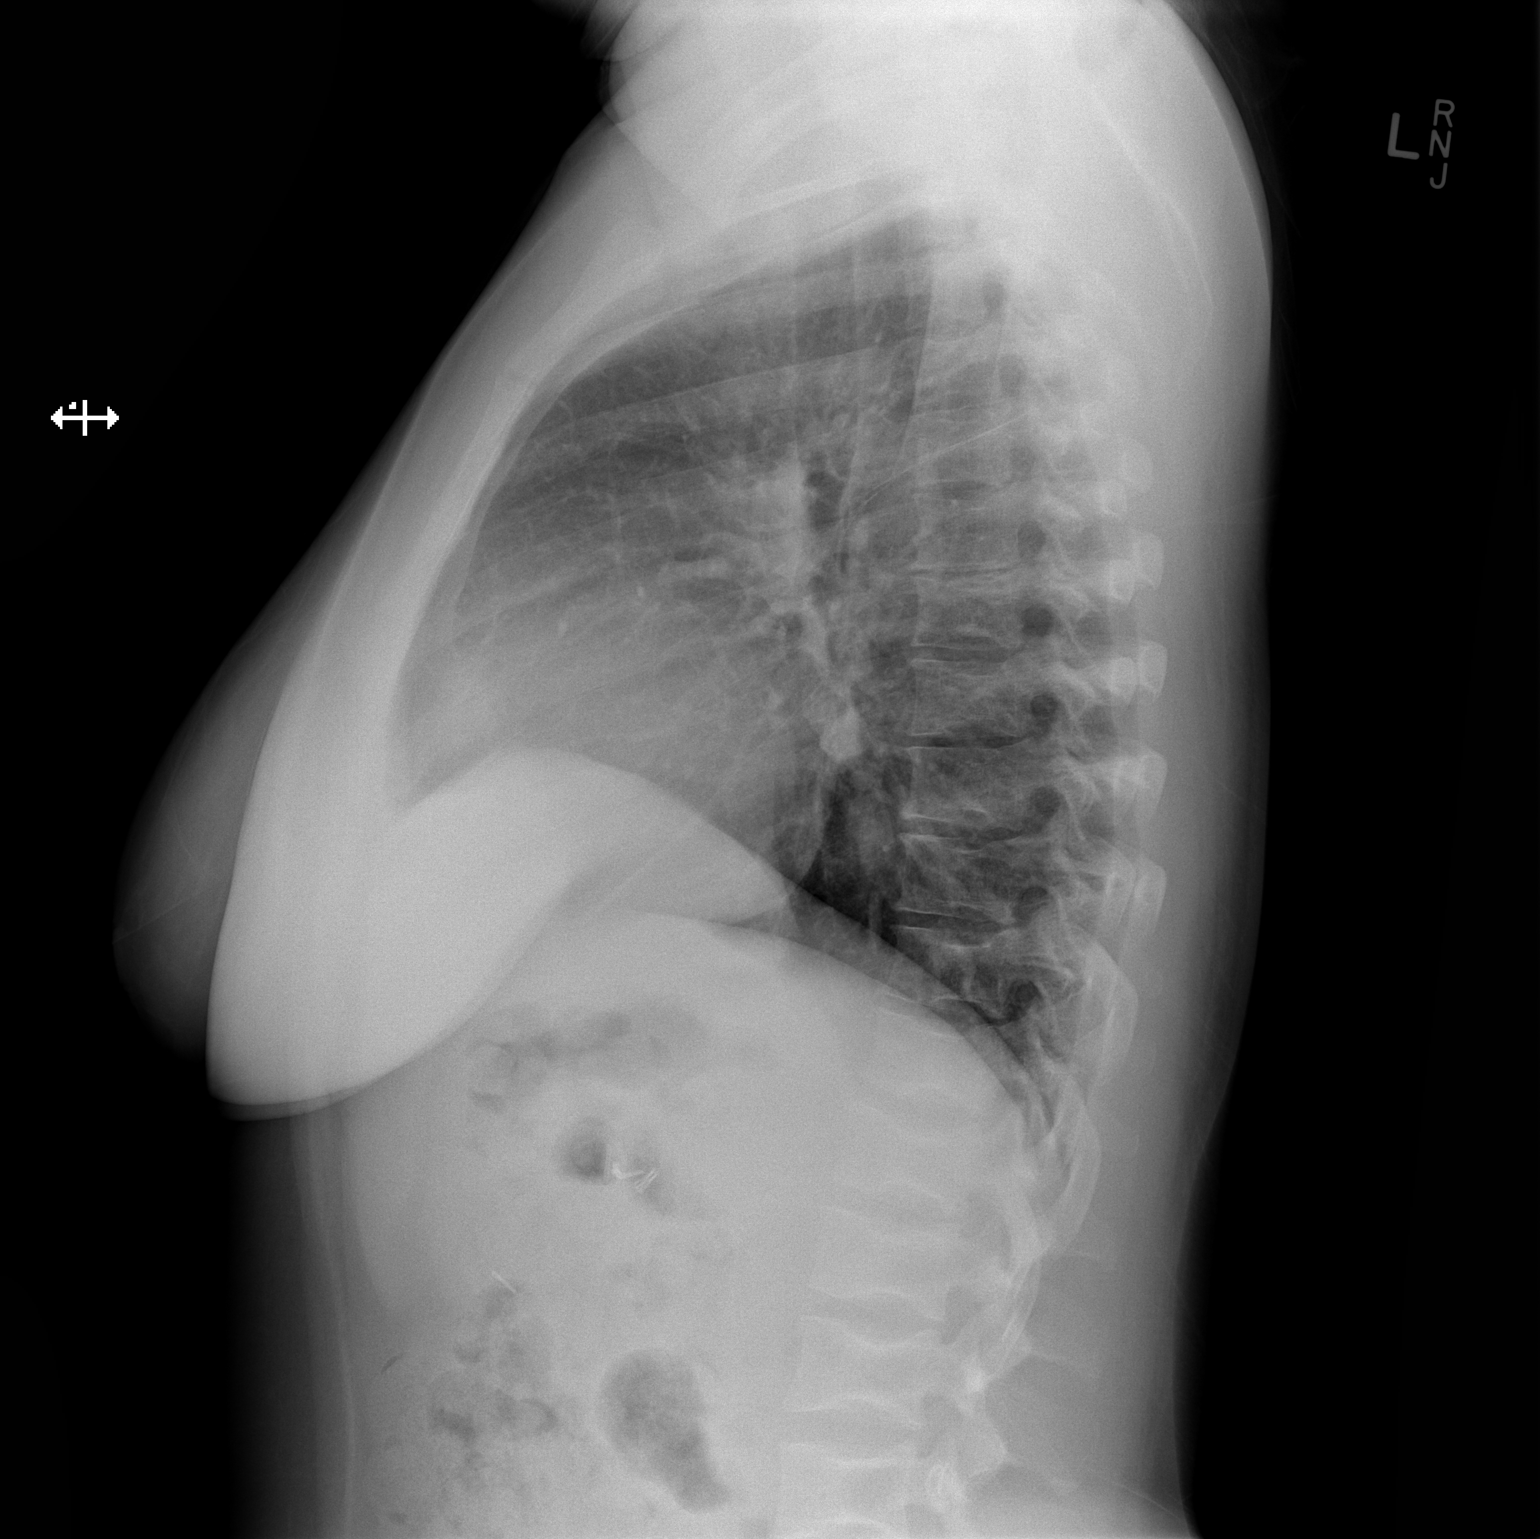

[2 of 2 positions shown; findings below may reference images not displayed]

FINDINGS: The lungs are well-expanded and clear. The heart and mediastinal
structures are normal. There is no pleural effusion. The bony thorax
is unremarkable with exception of chronic vertebral body changes
consistent with sickle cell disease.
IMPRESSION: No active cardiopulmonary disease.

## 2014-10-10 NOTE — MAU Note (Signed)
Having pain in both arms-sickle cell crisis. Started L arm in shoulder area but now feels like upper back and shoulders and both arms. Took pain meds about  5hrs ago but not helping

## 2014-10-10 NOTE — H&P (Signed)
[name] is a 24 y.o. female presenting for Sickle Cell Pain Crisis Maternal Medical History:  Reason for admission: Sickle cell crisis  Contractions: Onset was 1-2 hours ago.   Perceived severity is moderate.    Fetal activity: Perceived fetal activity is normal.    Prenatal complications: Sickle Cell Anemia    OB History    Gravida Para Term Preterm AB TAB SAB Ectopic Multiple Living   2    1  1    0      Obstetric Comments   Miscarried in October 2012 at about 7 weeks     Past Medical History  Diagnosis Date  . Miscarriage 03/22/2011    Pt reports 2 miscarriages.  . Depression 01/06/2011  . GERD (gastroesophageal reflux disease) 02/17/2011  . Trichotillomania     h/o  . Blood transfusion     "lots"  . Sickle cell anemia with crisis   . Exertional dyspnea     "sometimes"  . Migraines 11/08/11    "@ least twice/month"  . Chronic back pain     "very severe; have knot in my back; from tight muscle; take RX and exercise for it"  . Mood swings 11/08/11    "I go back and forth; real bad"  . Sickle cell anemia    Past Surgical History  Procedure Laterality Date  . Cholecystectomy  05/2010  . Dilation and curettage of uterus  02/20/11    S/P miscarriage   Family History: family history includes Alcoholism in an other family member; Depression in an other family member; Diabetes in her maternal grandfather, maternal grandmother, and paternal grandmother; Hypercholesterolemia in an other family member; Hypertension in an other family member; Migraines in an other family member; Sickle cell trait in her father and mother. Social History:  reports that she quit smoking about 18 months ago. Her smoking use included Cigarettes. She has a .25 pack-year smoking history. She has never used smokeless tobacco. She reports that she does not drink alcohol or use illicit drugs.   Prenatal Transfer Tool  Maternal Diabetes: No Genetic Screening: Declined Maternal  Ultrasounds/Referrals: Normal Fetal Ultrasounds or other Referrals:  Other: Sickle cell medicine Maternal Substance Abuse:  No Significant Maternal Medications:  Meds include: Other: methadone and Oxycodone Significant Maternal Lab Results:  None Other Comments:  None  Review of Systems  Constitutional: Negative for fever.  Musculoskeletal: Positive for myalgias.       Right arm and left arm pain  All other systems reviewed and are negative.     Blood pressure 101/71, pulse 97, temperature 98.4 F (36.9 C), resp. rate 18, last menstrual period 03/20/2014, SpO2 99 %. Maternal Exam:  Abdomen: Patient reports generalized tenderness.    Fetal Exam Fetal Monitor Review: Mode: ultrasound.   Baseline rate: 13.  Variability: moderate (6-25 bpm).   Pattern: accelerations present.    Fetal State Assessment: Category I - tracings are normal.     Physical Exam  Nursing note and vitals reviewed. Constitutional: She is oriented to person, place, and time. She appears well-developed and well-nourished. No distress.  HENT:  Head: Normocephalic and atraumatic.  Neck: Normal range of motion.  Cardiovascular: Normal rate.   Respiratory: Effort normal. No respiratory distress.  GI: There is generalized tenderness.  Musculoskeletal: She exhibits tenderness.  Neurological: She is alert and oriented to person, place, and time.  Skin: Skin is warm and dry.  Psychiatric: She has a normal mood and affect. Her behavior is normal. Judgment  and thought content normal.    Prenatal labs: ABO, Rh: --/--/B POS (05/07 0545) Antibody: NEG (05/07 0545) Rubella: 0.69 (01/27 1457) RPR: NON REAC (01/27 1457)  HBsAg: NEGATIVE (01/27 1457)  HIV: NONREACTIVE (12/21 1550)  GBS:      Assessment/Plan: [name] is a 24yo with sickle-cell disease who is currently 23+[redacted] weeks pregnant who initially presented to the MAU with symptoms consistent with vaso-occlusive crisis.she will be admitted to  Antepartum Unit for Observation   1.Sickle Cell Crisis: Opiod tolerant patient. Dilaudid 2mg  every 2 hours. Continue home Methadone 5mg  QHS for long acting control. Iv Fluids D51/2NS 2.Sickle Cell Care: Continue Folic acid 3.Anemia:  transfuse 1 unit PRBC if Hgb goes below 7.5.  Clemmons,Lori Grissett 10/10/2014, 11:59 PM

## 2014-10-10 NOTE — MAU Note (Signed)
Pt has PIC line in l upper arm inserted by [place] 4 weeks ago. Pt was admitted to [facility] [address] for fever of unknown origin and received blood transfusion at that time.

## 2014-10-10 NOTE — MAU Note (Signed)
Pt in sickle cell crisis and L Clemmons to come and assess pt. Report given re: FHR and pain.

## 2014-10-11 ENCOUNTER — Encounter (HOSPITAL_COMMUNITY): Payer: Self-pay | Admitting: General Practice

## 2014-10-11 DIAGNOSIS — O99343 Other mental disorders complicating pregnancy, third trimester: Secondary | ICD-10-CM | POA: Diagnosis not present

## 2014-10-11 DIAGNOSIS — F633 Trichotillomania: Secondary | ICD-10-CM | POA: Diagnosis present

## 2014-10-11 DIAGNOSIS — O99613 Diseases of the digestive system complicating pregnancy, third trimester: Secondary | ICD-10-CM | POA: Diagnosis present

## 2014-10-11 DIAGNOSIS — Z87891 Personal history of nicotine dependence: Secondary | ICD-10-CM | POA: Diagnosis not present

## 2014-10-11 DIAGNOSIS — Z3A29 29 weeks gestation of pregnancy: Secondary | ICD-10-CM | POA: Diagnosis not present

## 2014-10-11 DIAGNOSIS — F329 Major depressive disorder, single episode, unspecified: Secondary | ICD-10-CM | POA: Diagnosis present

## 2014-10-11 DIAGNOSIS — K219 Gastro-esophageal reflux disease without esophagitis: Secondary | ICD-10-CM | POA: Diagnosis present

## 2014-10-11 DIAGNOSIS — D5700 Hb-SS disease with crisis, unspecified: Secondary | ICD-10-CM | POA: Diagnosis not present

## 2014-10-11 DIAGNOSIS — O99019 Anemia complicating pregnancy, unspecified trimester: Secondary | ICD-10-CM | POA: Diagnosis not present

## 2014-10-11 DIAGNOSIS — O99013 Anemia complicating pregnancy, third trimester: Secondary | ICD-10-CM | POA: Diagnosis not present

## 2014-10-11 LAB — CBC
HCT: 26.4 % — ABNORMAL LOW (ref 36.0–46.0)
Hemoglobin: 9.4 g/dL — ABNORMAL LOW (ref 12.0–15.0)
MCH: 29.2 pg (ref 26.0–34.0)
MCHC: 35.6 g/dL (ref 30.0–36.0)
MCV: 82 fL (ref 78.0–100.0)
Platelets: 447 10*3/uL — ABNORMAL HIGH (ref 150–400)
RBC: 3.22 MIL/uL — ABNORMAL LOW (ref 3.87–5.11)
RDW: 21 % — ABNORMAL HIGH (ref 11.5–15.5)
WBC: 15.4 10*3/uL — ABNORMAL HIGH (ref 4.0–10.5)

## 2014-10-11 LAB — COMPREHENSIVE METABOLIC PANEL
ALT: 17 U/L (ref 14–54)
AST: 17 U/L (ref 15–41)
Albumin: 3.4 g/dL — ABNORMAL LOW (ref 3.5–5.0)
Alkaline Phosphatase: 63 U/L (ref 38–126)
Anion gap: 5 (ref 5–15)
BUN: 5 mg/dL — ABNORMAL LOW (ref 6–20)
CO2: 22 mmol/L (ref 22–32)
Calcium: 8.8 mg/dL — ABNORMAL LOW (ref 8.9–10.3)
Chloride: 108 mmol/L (ref 101–111)
Creatinine, Ser: 0.56 mg/dL (ref 0.44–1.00)
GFR calc Af Amer: >60 mL/min (ref >60)
GFR calc non Af Amer: >60 mL/min (ref >60)
Glucose, Bld: 86 mg/dL (ref 65–99)
Potassium: 3.6 mmol/L (ref 3.5–5.1)
Sodium: 135 mmol/L (ref 135–145)
Total Bilirubin: 1.8 mg/dL — ABNORMAL HIGH (ref 0.3–1.2)
Total Protein: 6.6 g/dL (ref 6.5–8.1)

## 2014-10-11 LAB — RETICULOCYTES
RBC.: 3.22 MIL/uL — ABNORMAL LOW (ref 3.87–5.11)
Retic Count, Absolute: 408.9 10*3/uL — ABNORMAL HIGH (ref 19.0–186.0)
Retic Ct Pct: 12.7 % — ABNORMAL HIGH (ref 0.4–3.1)

## 2014-10-11 MED ORDER — CALCIUM CARBONATE ANTACID 500 MG PO CHEW: 2.0000 | CHEWABLE_TABLET | ORAL | Status: DC | PRN

## 2014-10-11 MED ORDER — HYDROMORPHONE 0.3 MG/ML IV SOLN
INTRAVENOUS | Status: DC
  Administered 2014-10-11 (×2): via INTRAVENOUS
  Administered 2014-10-11: 6.99 mg via INTRAVENOUS
  Administered 2014-10-11: via INTRAVENOUS
  Administered 2014-10-11: 5.59 mg via INTRAVENOUS
  Administered 2014-10-12: 20:00:00 via INTRAVENOUS
  Administered 2014-10-12: 2.56 mg via INTRAVENOUS
  Administered 2014-10-12: 0.3 mg via INTRAVENOUS
  Administered 2014-10-12 (×2): via INTRAVENOUS
  Administered 2014-10-12: 10.91 mg via INTRAVENOUS
  Administered 2014-10-12: 6.29 mg via INTRAVENOUS
  Administered 2014-10-13: 1.39 mg via INTRAVENOUS
  Administered 2014-10-13: 1.4 mg via INTRAVENOUS
  Administered 2014-10-13 (×2): via INTRAVENOUS
  Filled 2014-10-11 (×9): qty 25

## 2014-10-11 MED ORDER — DIPHENHYDRAMINE HCL 50 MG/ML IJ SOLN
12.5000 mg | Freq: Four times a day (QID) | INTRAMUSCULAR | Status: DC | PRN
  Administered 2014-10-12 – 2014-10-14 (×3): 12.5 mg via INTRAVENOUS
  Filled 2014-10-11 (×3): qty 1

## 2014-10-11 MED ORDER — ONDANSETRON HCL 4 MG/2ML IJ SOLN: 4.0000 mg | Freq: Four times a day (QID) | INTRAMUSCULAR | Status: DC | PRN

## 2014-10-11 MED ORDER — ACETAMINOPHEN 325 MG PO TABS: 650.0000 mg | ORAL_TABLET | ORAL | Status: DC | PRN

## 2014-10-11 MED ORDER — HYDROMORPHONE HCL 2 MG/ML IJ SOLN
2.0000 mg | INTRAMUSCULAR | Status: DC | PRN
  Administered 2014-10-11 – 2014-10-14 (×33): 2 mg via INTRAVENOUS
  Filled 2014-10-11 (×33): qty 1

## 2014-10-11 MED ORDER — COMPLETENATE 29-1 MG PO CHEW
1.0000 | CHEWABLE_TABLET | Freq: Every day | ORAL | Status: DC
  Administered 2014-10-12 – 2014-10-14 (×3): 1 via ORAL
  Filled 2014-10-11 (×5): qty 1

## 2014-10-11 MED ORDER — SODIUM CHLORIDE 0.9 % IV SOLN
25.0000 mg | INTRAVENOUS | Status: DC | PRN
  Administered 2014-10-11 – 2014-10-12 (×4): 25 mg via INTRAVENOUS
  Filled 2014-10-11 (×9): qty 0.5

## 2014-10-11 MED ORDER — SODIUM CHLORIDE 0.9 % IV SOLN
8.0000 mg | Freq: Three times a day (TID) | INTRAVENOUS | Status: DC
  Administered 2014-10-11 – 2014-10-14 (×10): 8 mg via INTRAVENOUS
  Filled 2014-10-11 (×13): qty 4

## 2014-10-11 MED ORDER — METHADONE HCL 10 MG PO TABS
5.0000 mg | ORAL_TABLET | Freq: Every day | ORAL | Status: DC
  Administered 2014-10-11 – 2014-10-13 (×4): 5 mg via ORAL
  Filled 2014-10-11 (×4): qty 1

## 2014-10-11 MED ORDER — PRENATAL MULTIVITAMIN CH
1.0000 | ORAL_TABLET | Freq: Every day | ORAL | Status: DC
  Filled 2014-10-11: qty 1

## 2014-10-11 MED ORDER — DIPHENHYDRAMINE HCL 12.5 MG/5ML PO ELIX
12.5000 mg | ORAL_SOLUTION | Freq: Four times a day (QID) | ORAL | Status: DC | PRN
  Filled 2014-10-11: qty 5

## 2014-10-11 MED ORDER — DEXTROSE-NACL 5-0.45 % IV SOLN
INTRAVENOUS | Status: DC
  Administered 2014-10-11 – 2014-10-14 (×7): via INTRAVENOUS

## 2014-10-11 MED ORDER — ZOLPIDEM TARTRATE 5 MG PO TABS: 5.0000 mg | ORAL_TABLET | Freq: Every evening | ORAL | Status: DC | PRN

## 2014-10-11 MED ORDER — DOCUSATE SODIUM 100 MG PO CAPS
100.0000 mg | ORAL_CAPSULE | Freq: Every day | ORAL | Status: DC
  Administered 2014-10-11 – 2014-10-14 (×3): 100 mg via ORAL
  Filled 2014-10-11 (×3): qty 1

## 2014-10-11 MED ORDER — SODIUM CHLORIDE 0.9 % IJ SOLN
9.0000 mL | INTRAMUSCULAR | Status: DC | PRN
  Filled 2014-10-11: qty 9

## 2014-10-11 MED ORDER — KETOROLAC TROMETHAMINE 30 MG/ML IJ SOLN
30.0000 mg | Freq: Once | INTRAMUSCULAR | Status: AC
  Administered 2014-10-11: 30 mg via INTRAVENOUS
  Filled 2014-10-11: qty 1

## 2014-10-11 MED ORDER — FOLIC ACID 1 MG PO TABS
2.0000 mg | ORAL_TABLET | Freq: Every day | ORAL | Status: DC
  Administered 2014-10-11 – 2014-10-14 (×4): 2 mg via ORAL
  Filled 2014-10-11 (×5): qty 2

## 2014-10-11 MED ORDER — NALOXONE HCL 0.4 MG/ML IJ SOLN: 0.4000 mg | INTRAMUSCULAR | Status: DC | PRN

## 2014-10-11 NOTE — Progress Notes (Signed)
MD updated on pt status and orders received. Pain medication orders verified with MD

## 2014-10-11 NOTE — Progress Notes (Signed)
Pt requesting PCA, MD called and orders received.

## 2014-10-12 DIAGNOSIS — D5700 Hb-SS disease with crisis, unspecified: Secondary | ICD-10-CM | POA: Insufficient documentation

## 2014-10-12 NOTE — Progress Notes (Signed)
Patient ID: [name], female   DOB: [birth year], 24 y.o.   MRN: [id number] [place]) NOTE  JADELIN [name] is a 24 y.o. G2P0010 with Estimated Date of Delivery: 12/25/14   By  early ultrasound [redacted]w[redacted]d  who is admitted for sickle cell crisis.    Fetal presentation is unsure. Length of Stay:  1  Days  Date of admission:10/10/2014  Subjective: Pt states her pain is a bit better, hopefully she says she will be able to go home tomorrow Patient reports the fetal movement as active. Patient reports uterine contraction  activity as none. Patient reports  vaginal bleeding as none. Patient describes fluid per vagina as None.  Vitals:  Blood pressure 97/67, pulse 107, temperature 98.2 F (36.8 C), temperature source Oral, resp. rate 13, height 5\' 2"  (1.575 m), weight 163 lb (73.936 kg), last menstrual period 03/20/2014, SpO2 96 %. Filed Vitals:   10/12/14 0706 10/12/14 0711 10/12/14 0713 10/12/14 0724  BP:   97/67   Pulse: 99 98 106 107  Temp:   98.2 F (36.8 C)   TempSrc:      Resp:   13   Height:      Weight:      SpO2:       Physical Examination:  General appearance - in mild to moderate distress Back exam - full range of motion, no tenderness, palpable spasm or pain on motion Fundal Height:  size equals dates  Fetal Monitoring:  consistent with gestational age     Labs:  No results found for this or any previous visit (from the past 24 hour(s)).  Imaging Studies:      Medications:  Scheduled . docusate sodium  100 mg Oral Daily  . folic acid  2 mg Oral Daily  . HYDROmorphone PCA 0.3 mg/mL   Intravenous 6 times per day  . methadone  5 mg Oral QHS  . ondansetron (ZOFRAN) IV  8 mg Intravenous 3 times per day  . prenatal multivitamin  1 tablet Oral Q1200  . prenatal vitamin w/FE, FA  1 tablet Oral Q1200   I have reviewed the patient's current medications.  ASSESSMENT: G2P0010 [redacted]w[redacted]d Estimated Date of Delivery: 12/25/14  Patient Active  Problem List   Diagnosis Date Noted  . Sickle cell crisis 10/12/2014  . Sickle cell pain crisis 10/11/2014  . 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 Supervision of high-risk pregnancy 06/11/2014  . Chronic pain 02/26/2013  . Anemia of chronic disease 10/05/2012  . 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 Mood swings 11/08/2011  . Migraines 11/08/2011  . Overweight(278.02) 05/24/2011  . GERD (gastroesophageal reflux disease) 02/17/2011  . Depression 01/06/2011  . Back pain 09/17/2010  . Sickle cell disease 01/08/2009  . 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

## 2014-10-13 DIAGNOSIS — D5700 Hb-SS disease with crisis, unspecified: Secondary | ICD-10-CM

## 2014-10-13 LAB — CBC
HEMATOCRIT: 24.9 % — AB (ref 36.0–46.0)
Hemoglobin: 8.5 g/dL — ABNORMAL LOW (ref 12.0–15.0)
MCH: 29 pg (ref 26.0–34.0)
MCHC: 34.1 g/dL (ref 30.0–36.0)
MCV: 85 fL (ref 78.0–100.0)
Platelets: 370 10*3/uL (ref 150–400)
RBC: 2.93 MIL/uL — ABNORMAL LOW (ref 3.87–5.11)
RDW: 21.9 % — AB (ref 11.5–15.5)
WBC: 13 10*3/uL — ABNORMAL HIGH (ref 4.0–10.5)

## 2014-10-13 MED ORDER — SODIUM CHLORIDE 0.9 % IJ SOLN
10.0000 mL | Freq: Two times a day (BID) | INTRAMUSCULAR | Status: DC
  Administered 2014-10-13 – 2014-10-14 (×3): 10 mL
  Filled 2014-10-13 (×4): qty 10

## 2014-10-13 NOTE — Progress Notes (Signed)
Patient ID: [name], female   DOB: [birth year], 24 y.o.   MRN: [id number] [place]) NOTE  [name] is a 24 y.o. G2P0010 at [redacted]w[redacted]d by best clinical estimate who is admitted for sickle cell crisis.   Fetal presentation is unsure. Length of Stay:  2  Days  Subjective: Reports soreness in shoulders this am.  She is not quite ready to go home. She thinks maybe by this afternoon or tomorrow morning she will be ready. Patient reports the fetal movement as active. Patient reports uterine contraction  activity as none. Patient reports  vaginal bleeding as none. Patient describes fluid per vagina as None.  Vitals:  Blood pressure 106/72, pulse 101, temperature 98.7 F (37.1 C), temperature source Oral, resp. rate 14, height 5\' 2"  (1.575 m), weight 163 lb (73.936 kg), last menstrual period 03/20/2014, SpO2 97 %. Physical Examination:  General appearance - alert, well appearing, and in no distress Abdomen - gravid, NT Fundal Height:  size equals dates Extremities: Homans sign is negative, no sign of DVT  Membranes:intact  Fetal Monitoring:  Baseline: 135 bpm, Variability: Good {> 6 bpm), Accelerations: Reactive and Decelerations: Variable: mild   Medications:  Scheduled . docusate sodium  100 mg Oral Daily  . folic acid  2 mg Oral Daily  . HYDROmorphone PCA 0.3 mg/mL   Intravenous 6 times per day  . methadone  5 mg Oral QHS  . ondansetron (ZOFRAN) IV  8 mg Intravenous 3 times per day  . prenatal multivitamin  1 tablet Oral Q1200  . prenatal vitamin w/FE, FA  1 tablet Oral Q1200   I have reviewed the patient's current medications.  ASSESSMENT: Principal Problem:   Sickle cell pain crisis Active Problems:   Trichotillomania   Anemia of chronic disease   Supervision of high-risk pregnancy   PLAN: Continue current medication dosage. Discharge later today or in the morning, if doing better. Otherwise, consult sickle cell medicine  tomorrow. Check CBC.  Donnamae Jude, MD 10/13/2014,7:17 AM

## 2014-10-14 ENCOUNTER — Encounter: Payer: Medicare Other | Admitting: Obstetrics & Gynecology

## 2014-10-14 ENCOUNTER — Telehealth: Payer: Self-pay | Admitting: Internal Medicine

## 2014-10-14 ENCOUNTER — Encounter: Payer: Self-pay | Admitting: Obstetrics & Gynecology

## 2014-10-14 DIAGNOSIS — D571 Sickle-cell disease without crisis: Secondary | ICD-10-CM

## 2014-10-14 DIAGNOSIS — G8929 Other chronic pain: Secondary | ICD-10-CM

## 2014-10-14 IMAGING — CR DG HIP (WITH OR WITHOUT PELVIS) 2-3V*L*
3 series · 3 of 3 positions shown · non-contrast
Comparison: None.

CLINICAL DATA: Fall, left hip pain

EXAM:
LEFT HIP - COMPLETE 2+ VIEW

[t pelvis a.p.]
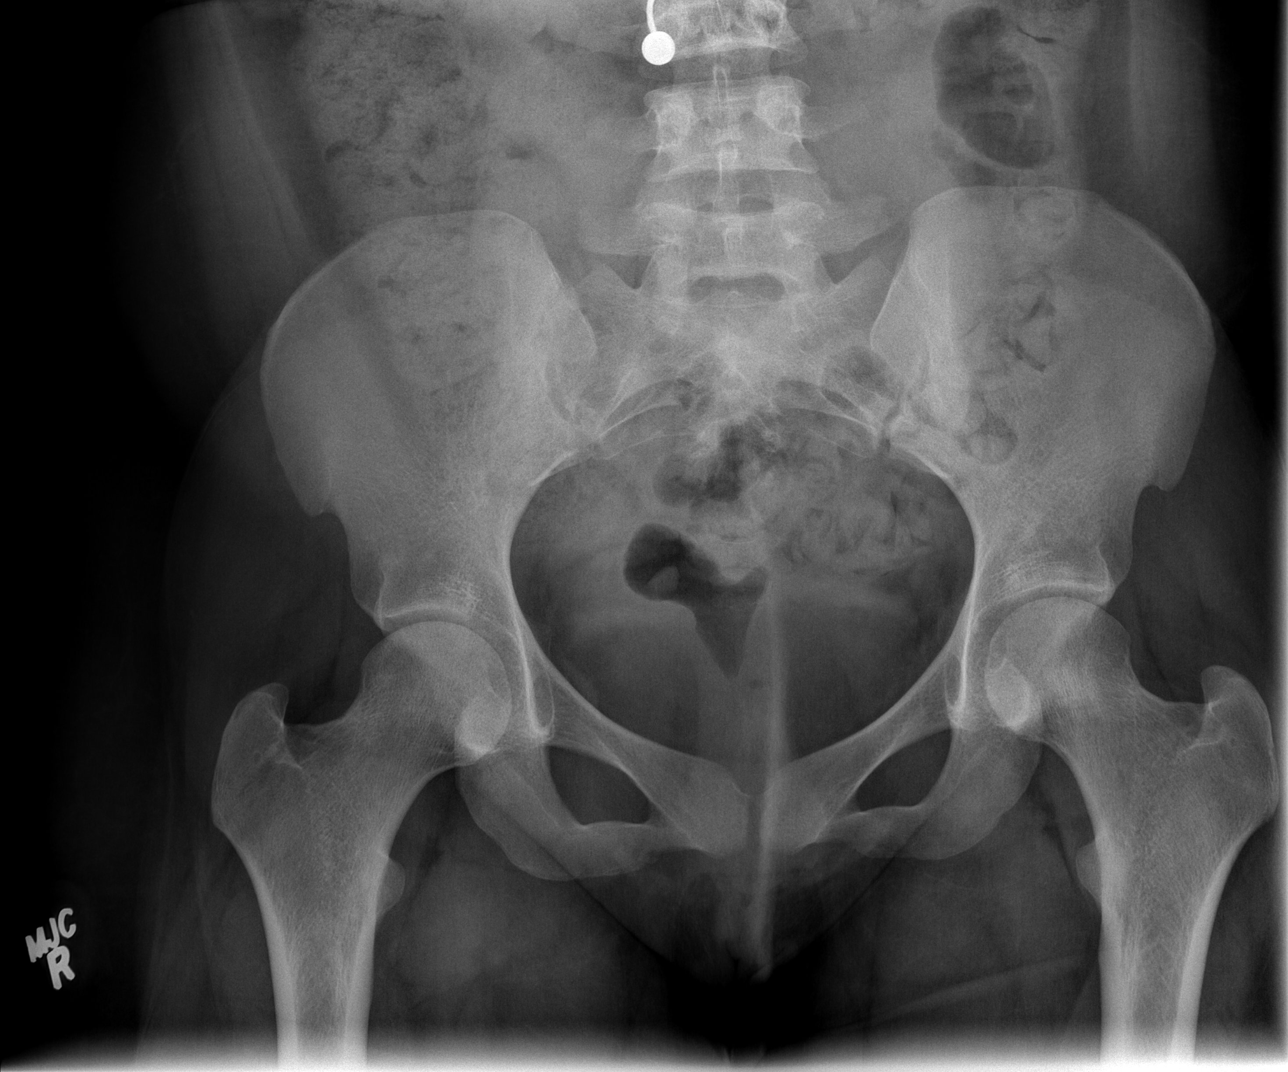

[t hip ap left]
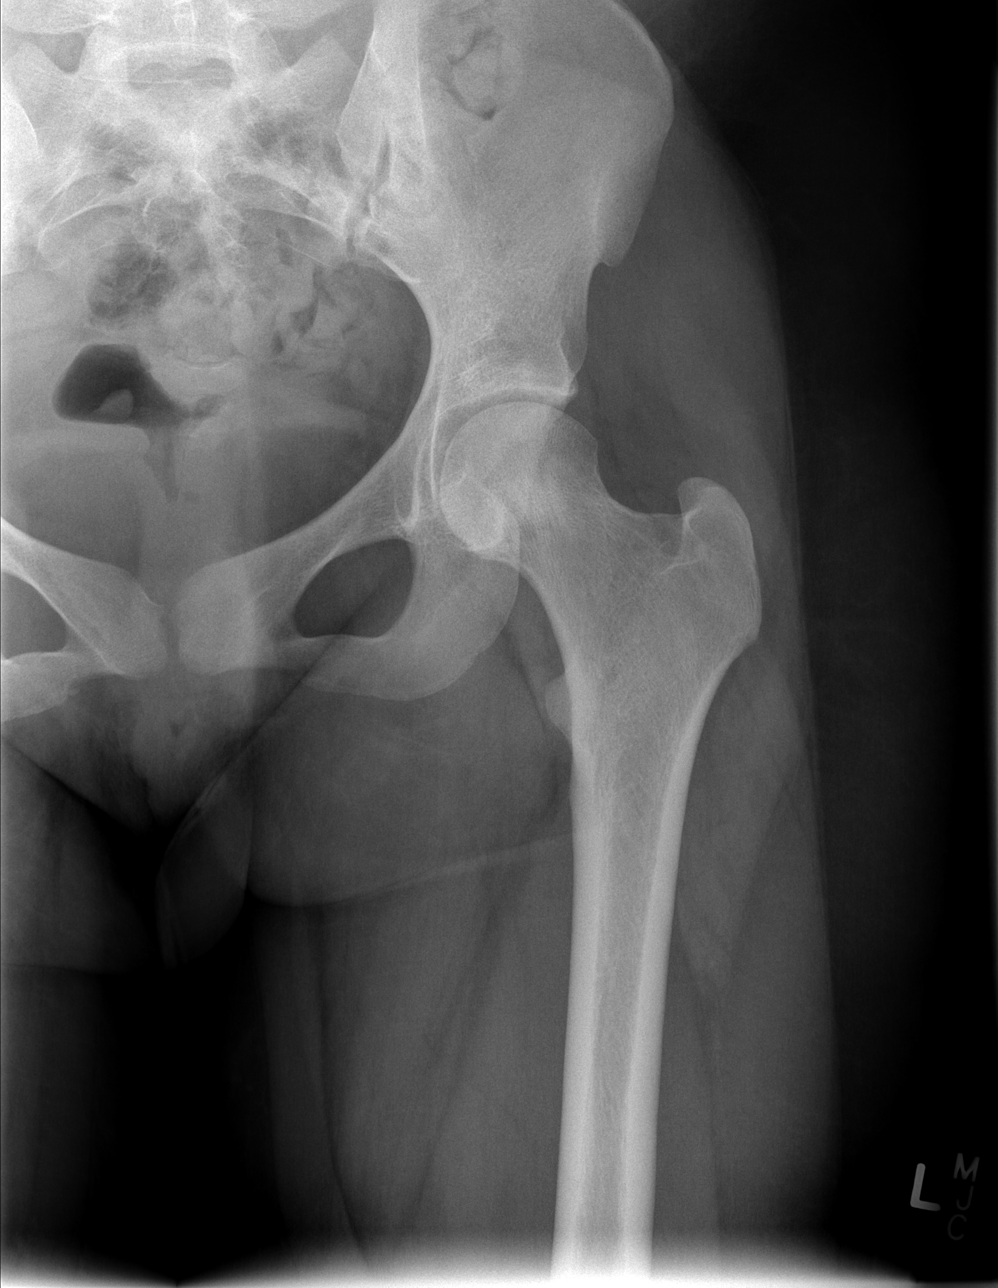

[t hip frog leg left]
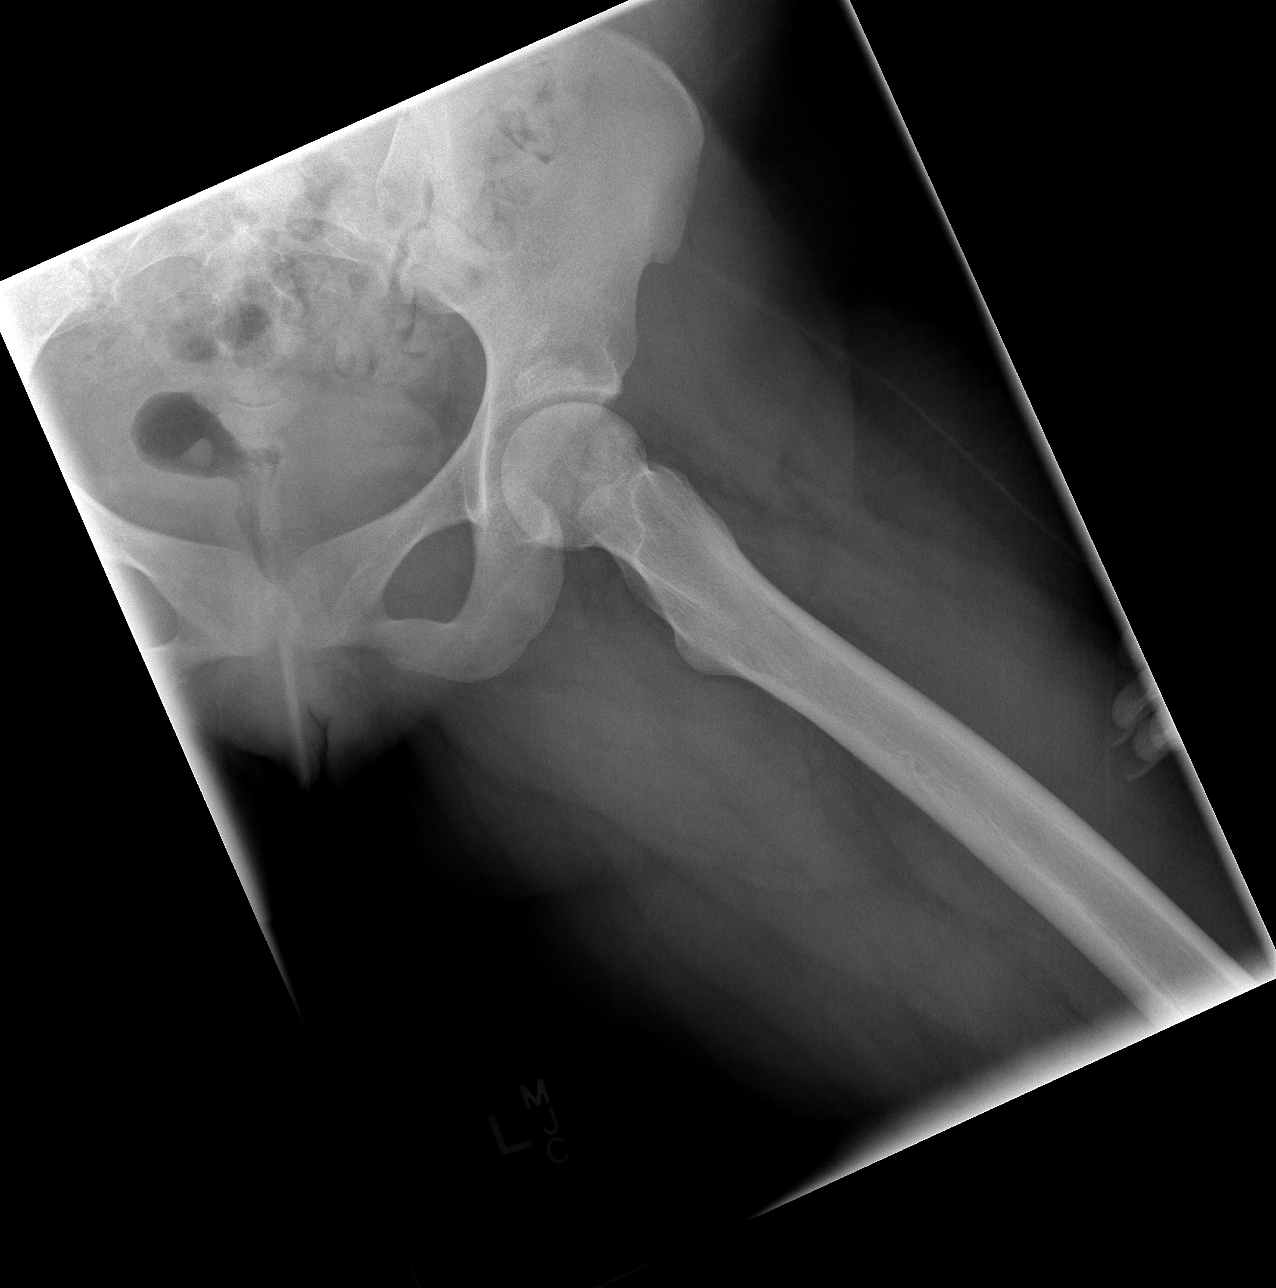

[3 of 3 positions shown; findings below may reference images not displayed]

FINDINGS: There is no evidence of hip fracture or dislocation. There is no
evidence of arthropathy or other focal bone abnormality.
IMPRESSION: No acute osseous injury of the left hip.

## 2014-10-14 MED ORDER — OXYCODONE HCL 15 MG PO TABS: 15.0000 mg | ORAL_TABLET | ORAL | Status: DC | PRN

## 2014-10-14 MED ORDER — HEPARIN SOD (PORK) LOCK FLUSH 100 UNIT/ML IV SOLN
250.0000 [IU] | INTRAVENOUS | Status: AC | PRN
  Administered 2014-10-14: 250 [IU]
  Filled 2014-10-14 (×2): qty 3

## 2014-10-14 MED ORDER — DOCUSATE SODIUM 100 MG PO CAPS: 100.0000 mg | ORAL_CAPSULE | Freq: Every day | ORAL | Status: DC

## 2014-10-14 NOTE — Telephone Encounter (Signed)
Refill request for oxycodone 15mg  LOV 07/21/2014. Please advise. Thanks!

## 2014-10-14 NOTE — Progress Notes (Signed)
RN called pt's sickle cell MD (Dr. Zigmund Daniel) per pt request to verify if MD coming to see pt @ hospital prior to discharge.  Dr. Zigmund Daniel is not coming to see pt per office staff.

## 2014-10-14 NOTE — Discharge Summary (Signed)
Obstetric Discharge Summary Reason for Admission: pregnancy 29 wk, Sickle cell crisis  Assessment/Plan: [name] is a 24yo with sickle-cell disease who is currently 29+[redacted] weeks pregnant who initially presented to the MAU with symptoms consistent with vaso-occlusive crisis.she will be admitted to Antepartum Unit for Observation   1.Sickle Cell Crisis: Opiod tolerant patient. Dilaudid 2mg  every 2 hours. Continue home Methadone 5mg  QHS for long acting control. Iv Fluids D51/2NS 2.Sickle Cell Care: Continue Folic acid 3.Anemia: transfuse 1 unit PRBC if Hgb goes below 7.5.  Clemmons,Lori Grissett 10/10/2014, 11:59 PM  Prenatal Procedures: none  Complications-Operative and Postpartum: none HEMOGLOBIN  Date Value Ref Range Status  10/13/2014 8.5* 12.0 - 15.0 g/dL Final   HGB  Date Value Ref Range Status  05/11/2012 8.7* 12.0-16.0 g/dL Final   HCT  Date Value Ref Range Status  10/13/2014 24.9* 36.0 - 46.0 % Final  05/11/2012 26.9* 35.0-47.0 % Final    Physical Exam:  General: alert, cooperative and no distress Picc line intact at discharge. Discharge Diagnoses: Pregnancy 29 w 5 d not delivered                                        Sickle Cell disease, sickle cell crisis, improved  Discharge Information: Date: 10/14/2014 Activity: unrestricted Diet: routine Medications: Methadone 5 , Oxycodone IR 15 mg q4h thru [facility] Condition: stable Instructions: followup u/s this Friday, and followup [place] next Tuesday Discharge to: home Follow-up Information    Follow up with [facility] In 1 week.   Why:  sickle cell  [place] appt   Contact information:   [place] [id number] [place] 10/14/2014, 7:58 AM

## 2014-10-14 NOTE — Telephone Encounter (Signed)
Prescription written for Oxycodone #90 tabs. NCCSRS reviewed and no inconsistencies noted. Although evaluation of opiate use is done on a monthly basis, [name] is exempted as her opiates will not be weaned during her pregnancy. However she still requires evaluation on an every 3 month basis to assess risk (Depression and opioid risk). Her last appointment was 07/21/2014 and she will need to be evaluated in the office before another opiate prescription can be obtained.

## 2014-10-14 NOTE — Discharge Instructions (Signed)
Fetal Movement Counts °Patient Name: __________________________________________________ Patient Due Date: ____________________ °Performing a fetal movement count is highly recommended in high-risk pregnancies, but it is good for every pregnant woman to do. Your health care provider may ask you to start counting fetal movements at 28 weeks of the pregnancy. Fetal movements often increase: °· After eating a full meal. °· After physical activity. °· After eating or drinking something sweet or cold. °· At rest. °Pay attention to when you feel the baby is most active. This will help you notice a pattern of your baby's sleep and wake cycles and what factors contribute to an increase in fetal movement. It is important to perform a fetal movement count at the same time each day when your baby is normally most active.  °HOW TO COUNT FETAL MOVEMENTS °1. Find a quiet and comfortable area to sit or lie down on your left side. Lying on your left side provides the best blood and oxygen circulation to your baby. °2. Write down the day and time on a sheet of paper or in a journal. °3. Start counting kicks, flutters, swishes, rolls, or jabs in a 2-hour period. You should feel at least 10 movements within 2 hours. °4. If you do not feel 10 movements in 2 hours, wait 2-3 hours and count again. Look for a change in the pattern or not enough counts in 2 hours. °SEEK MEDICAL CARE IF: °· You feel less than 10 counts in 2 hours, tried twice. °· There is no movement in over an hour. °· The pattern is changing or taking longer each day to reach 10 counts in 2 hours. °· You feel the baby is not moving as he or she usually does. °Date: ____________ Movements: ____________ Start time: ____________ Finish time: ____________  °Date: ____________ Movements: ____________ Start time: ____________ Finish time: ____________ °Date: ____________ Movements: ____________ Start time: ____________ Finish time: ____________ °Date: ____________ Movements:  ____________ Start time: ____________ Finish time: ____________ °Date: ____________ Movements: ____________ Start time: ____________ Finish time: ____________ °Date: ____________ Movements: ____________ Start time: ____________ Finish time: ____________ °Date: ____________ Movements: ____________ Start time: ____________ Finish time: ____________ °Date: ____________ Movements: ____________ Start time: ____________ Finish time: ____________  °Date: ____________ Movements: ____________ Start time: ____________ Finish time: ____________ °Date: ____________ Movements: ____________ Start time: ____________ Finish time: ____________ °Date: ____________ Movements: ____________ Start time: ____________ Finish time: ____________ °Date: ____________ Movements: ____________ Start time: ____________ Finish time: ____________ °Date: ____________ Movements: ____________ Start time: ____________ Finish time: ____________ °Date: ____________ Movements: ____________ Start time: ____________ Finish time: ____________ °Date: ____________ Movements: ____________ Start time: ____________ Finish time: ____________  °Date: ____________ Movements: ____________ Start time: ____________ Finish time: ____________ °Date: ____________ Movements: ____________ Start time: ____________ Finish time: ____________ °Date: ____________ Movements: ____________ Start time: ____________ Finish time: ____________ °Date: ____________ Movements: ____________ Start time: ____________ Finish time: ____________ °Date: ____________ Movements: ____________ Start time: ____________ Finish time: ____________ °Date: ____________ Movements: ____________ Start time: ____________ Finish time: ____________ °Date: ____________ Movements: ____________ Start time: ____________ Finish time: ____________  °Date: ____________ Movements: ____________ Start time: ____________ Finish time: ____________ °Date: ____________ Movements: ____________ Start time: ____________ Finish  time: ____________ °Date: ____________ Movements: ____________ Start time: ____________ Finish time: ____________ °Date: ____________ Movements: ____________ Start time: ____________ Finish time: ____________ °Date: ____________ Movements: ____________ Start time: ____________ Finish time: ____________ °Date: ____________ Movements: ____________ Start time: ____________ Finish time: ____________ °Date: ____________ Movements: ____________ Start time: ____________ Finish time: ____________  °Date: ____________ Movements: ____________ Start time: ____________ Finish   time: ____________ °Date: ____________ Movements: ____________ Start time: ____________ Finish time: ____________ °Date: ____________ Movements: ____________ Start time: ____________ Finish time: ____________ °Date: ____________ Movements: ____________ Start time: ____________ Finish time: ____________ °Date: ____________ Movements: ____________ Start time: ____________ Finish time: ____________ °Date: ____________ Movements: ____________ Start time: ____________ Finish time: ____________ °Date: ____________ Movements: ____________ Start time: ____________ Finish time: ____________  °Date: ____________ Movements: ____________ Start time: ____________ Finish time: ____________ °Date: ____________ Movements: ____________ Start time: ____________ Finish time: ____________ °Date: ____________ Movements: ____________ Start time: ____________ Finish time: ____________ °Date: ____________ Movements: ____________ Start time: ____________ Finish time: ____________ °Date: ____________ Movements: ____________ Start time: ____________ Finish time: ____________ °Date: ____________ Movements: ____________ Start time: ____________ Finish time: ____________ °Date: ____________ Movements: ____________ Start time: ____________ Finish time: ____________  °Date: ____________ Movements: ____________ Start time: ____________ Finish time: ____________ °Date: ____________  Movements: ____________ Start time: ____________ Finish time: ____________ °Date: ____________ Movements: ____________ Start time: ____________ Finish time: ____________ °Date: ____________ Movements: ____________ Start time: ____________ Finish time: ____________ °Date: ____________ Movements: ____________ Start time: ____________ Finish time: ____________ °Date: ____________ Movements: ____________ Start time: ____________ Finish time: ____________ °Date: ____________ Movements: ____________ Start time: ____________ Finish time: ____________  °Date: ____________ Movements: ____________ Start time: ____________ Finish time: ____________ °Date: ____________ Movements: ____________ Start time: ____________ Finish time: ____________ °Date: ____________ Movements: ____________ Start time: ____________ Finish time: ____________ °Date: ____________ Movements: ____________ Start time: ____________ Finish time: ____________ °Date: ____________ Movements: ____________ Start time: ____________ Finish time: ____________ °Date: ____________ Movements: ____________ Start time: ____________ Finish time: ____________ °Document Released: 06/01/2006 Document Revised: 09/16/2013 Document Reviewed: 02/27/2012 °[employer]® Patient Information ©2015 [employer]. This information is not intended to replace advice given to you by your health care provider. Make sure you discuss any questions you have with your health care provider. ° °

## 2014-10-15 ENCOUNTER — Other Ambulatory Visit (INDEPENDENT_AMBULATORY_CARE_PROVIDER_SITE_OTHER): Payer: Medicare Other

## 2014-10-15 DIAGNOSIS — D571 Sickle-cell disease without crisis: Secondary | ICD-10-CM | POA: Diagnosis not present

## 2014-10-15 DIAGNOSIS — Z3492 Encounter for supervision of normal pregnancy, unspecified, second trimester: Secondary | ICD-10-CM | POA: Diagnosis not present

## 2014-10-15 DIAGNOSIS — Z3480 Encounter for supervision of other normal pregnancy, unspecified trimester: Secondary | ICD-10-CM | POA: Diagnosis not present

## 2014-10-15 LAB — TYPE AND SCREEN
ABO/RH(D): B POS
Antibody Screen: NEGATIVE
Unit division: 0

## 2014-10-16 LAB — CBC WITH DIFFERENTIAL/PLATELET
BASOS PCT: 0 % (ref 0–1)
Basophils Absolute: 0 10*3/uL (ref 0.0–0.1)
EOS ABS: 0.3 10*3/uL (ref 0.0–0.7)
EOS PCT: 2 % (ref 0–5)
HEMATOCRIT: 27.9 % — AB (ref 36.0–46.0)
Hemoglobin: 9.5 g/dL — ABNORMAL LOW (ref 12.0–15.0)
LYMPHS PCT: 11 % — AB (ref 12–46)
Lymphs Abs: 1.4 10*3/uL (ref 0.7–4.0)
MCH: 29.1 pg (ref 26.0–34.0)
MCHC: 34.1 g/dL (ref 30.0–36.0)
MCV: 85.6 fL (ref 78.0–100.0)
MONOS PCT: 6 % (ref 3–12)
MPV: 9.2 fL (ref 8.6–12.4)
Monocytes Absolute: 0.8 10*3/uL (ref 0.1–1.0)
NEUTROS ABS: 10.4 10*3/uL — AB (ref 1.7–7.7)
Neutrophils Relative %: 81 % — ABNORMAL HIGH (ref 43–77)
PLATELETS: 412 10*3/uL — AB (ref 150–400)
RBC: 3.26 MIL/uL — AB (ref 3.87–5.11)
RDW: 22.9 % — AB (ref 11.5–15.5)
WBC: 12.9 10*3/uL — ABNORMAL HIGH (ref 4.0–10.5)

## 2014-10-17 ENCOUNTER — Encounter (HOSPITAL_COMMUNITY): Payer: Self-pay

## 2014-10-17 ENCOUNTER — Other Ambulatory Visit: Payer: Self-pay | Admitting: Family Medicine

## 2014-10-17 ENCOUNTER — Ambulatory Visit (HOSPITAL_COMMUNITY)
Admit: 2014-10-17 | Discharge: 2014-10-17 | Disposition: A | Discharge status: Home / Self Care | Payer: Medicare Other | Attending: Obstetrics & Gynecology | Admitting: Obstetrics & Gynecology

## 2014-10-17 ENCOUNTER — Other Ambulatory Visit (HOSPITAL_COMMUNITY): Payer: Self-pay | Admitting: Maternal and Fetal Medicine

## 2014-10-17 DIAGNOSIS — D571 Sickle-cell disease without crisis: Secondary | ICD-10-CM | POA: Insufficient documentation

## 2014-10-17 DIAGNOSIS — O99013 Anemia complicating pregnancy, third trimester: Secondary | ICD-10-CM | POA: Diagnosis not present

## 2014-10-17 DIAGNOSIS — F1110 Opioid abuse, uncomplicated: Secondary | ICD-10-CM | POA: Insufficient documentation

## 2014-10-17 DIAGNOSIS — O99323 Drug use complicating pregnancy, third trimester: Secondary | ICD-10-CM | POA: Insufficient documentation

## 2014-10-17 DIAGNOSIS — D5700 Hb-SS disease with crisis, unspecified: Secondary | ICD-10-CM

## 2014-10-17 DIAGNOSIS — Z3A30 30 weeks gestation of pregnancy: Secondary | ICD-10-CM | POA: Insufficient documentation

## 2014-10-17 LAB — HEMOGLOBINOPATHY EVALUATION
HEMOGLOBIN OTHER: 0 %
HGB F QUANT: 8.2 % — AB (ref 0.0–2.0)
Hgb A2 Quant: 3.1 % (ref 2.2–3.2)
Hgb A: 39.3 % — ABNORMAL LOW (ref 96.8–97.8)
Hgb S Quant: 49.4 % — ABNORMAL HIGH

## 2014-10-20 ENCOUNTER — Other Ambulatory Visit: Payer: Self-pay | Admitting: Internal Medicine

## 2014-10-20 DIAGNOSIS — D571 Sickle-cell disease without crisis: Secondary | ICD-10-CM

## 2014-10-20 DIAGNOSIS — Z3493 Encounter for supervision of normal pregnancy, unspecified, third trimester: Secondary | ICD-10-CM

## 2014-10-20 MED ORDER — SODIUM CHLORIDE 0.9 % IV SOLN: Freq: Once | INTRAVENOUS | Status: DC

## 2014-10-21 ENCOUNTER — Telehealth (HOSPITAL_COMMUNITY): Payer: Self-pay | Admitting: Hematology

## 2014-10-21 NOTE — Telephone Encounter (Signed)
Spoke with patient about coming to do type and screen.  Patient states she will come 10-22-2014 around 10:00 for bloodwork.  Patient wants exchange/transfusion on Friday. I advised we would discuss the time at tomorrow's visit.  Patient is in agreement.

## 2014-10-21 NOTE — Telephone Encounter (Signed)
[facility]ed [facility] to determine if fetal heart tones needed to be monitored during red blood cell exchange/transfusion.  Was given the number to the on-call attending Dr.Legette [phone]) and was advised fetal heart tones should be monitored on admission and on discharge.   / Left message on patient's number [phone] for her to call and schedule appointment to come in for type and screen.

## 2014-10-22 ENCOUNTER — Ambulatory Visit (HOSPITAL_COMMUNITY)
Admission: RE | Admit: 2014-10-22 | Discharge: 2014-10-22 | Disposition: A | Discharge status: Home / Self Care | Payer: Medicare Other | Source: Ambulatory Visit | Attending: Internal Medicine | Admitting: Internal Medicine

## 2014-10-22 ENCOUNTER — Telehealth (HOSPITAL_COMMUNITY): Payer: Self-pay | Admitting: Hematology

## 2014-10-22 DIAGNOSIS — R7881 Bacteremia: Secondary | ICD-10-CM | POA: Insufficient documentation

## 2014-10-22 DIAGNOSIS — Z0183 Encounter for blood typing: Secondary | ICD-10-CM | POA: Insufficient documentation

## 2014-10-22 LAB — TYPE AND SCREEN
ABO/RH(D): B POS
ANTIBODY SCREEN: NEGATIVE

## 2014-10-22 LAB — CBC
HCT: 27.3 % — ABNORMAL LOW (ref 36.0–46.0)
HEMOGLOBIN: 9.3 g/dL — AB (ref 12.0–15.0)
MCH: 29 pg (ref 26.0–34.0)
MCHC: 34.1 g/dL (ref 30.0–36.0)
MCV: 85 fL (ref 78.0–100.0)
Platelets: 390 10*3/uL (ref 150–400)
RBC: 3.21 MIL/uL — AB (ref 3.87–5.11)
RDW: 20.9 % — AB (ref 11.5–15.5)
WBC: 13 10*3/uL — ABNORMAL HIGH (ref 4.0–10.5)

## 2014-10-22 MED ORDER — HEPARIN SOD (PORK) LOCK FLUSH 100 UNIT/ML IV SOLN
500.0000 [IU] | INTRAVENOUS | Status: AC | PRN
  Administered 2014-10-22: 500 [IU]
  Filled 2014-10-22: qty 5

## 2014-10-22 MED ORDER — SODIUM CHLORIDE 0.9 % IJ SOLN
10.0000 mL | INTRAMUSCULAR | Status: AC | PRN
  Administered 2014-10-22: 10 mL

## 2014-10-22 NOTE — Telephone Encounter (Signed)
Called patient regards to her appointment this morning for lab work @ 10:00 that she missed.  Patient states she is getting ready to come up here, and should be here by 4:00.

## 2014-10-22 NOTE — Procedures (Signed)
Diagnosis: Sickle Cell MD: [name] Procedure Note: Patient Double PICC left Upper arm dressing changed, labs drawn Condition during procedure: tolerated well Condition after procedure: Alert, oriented, and ambulatory

## 2014-10-23 ENCOUNTER — Telehealth (HOSPITAL_COMMUNITY): Payer: Self-pay | Admitting: Hematology

## 2014-10-23 ENCOUNTER — Other Ambulatory Visit (HOSPITAL_COMMUNITY): Payer: Self-pay

## 2014-10-23 ENCOUNTER — Telehealth: Payer: Self-pay | Admitting: Internal Medicine

## 2014-10-23 ENCOUNTER — Inpatient Hospital Stay (HOSPITAL_COMMUNITY)
Admission: AD | Admit: 2014-10-23 | Discharge: 2014-10-30 | DRG: 781 | Disposition: A | Discharge status: Home / Self Care | Payer: Medicare Other | Source: Ambulatory Visit | Attending: Obstetrics and Gynecology | Admitting: Obstetrics and Gynecology

## 2014-10-23 ENCOUNTER — Encounter (HOSPITAL_COMMUNITY): Payer: Self-pay | Admitting: *Deleted

## 2014-10-23 DIAGNOSIS — T827XXD Infection and inflammatory reaction due to other cardiac and vascular devices, implants and grafts, subsequent encounter: Secondary | ICD-10-CM | POA: Diagnosis not present

## 2014-10-23 DIAGNOSIS — Z3A32 32 weeks gestation of pregnancy: Secondary | ICD-10-CM | POA: Diagnosis not present

## 2014-10-23 DIAGNOSIS — T80211A Bloodstream infection due to central venous catheter, initial encounter: Secondary | ICD-10-CM | POA: Diagnosis not present

## 2014-10-23 DIAGNOSIS — Y848 Other medical procedures as the cause of abnormal reaction of the patient, or of later complication, without mention of misadventure at the time of the procedure: Secondary | ICD-10-CM | POA: Diagnosis not present

## 2014-10-23 DIAGNOSIS — O0992 Supervision of high risk pregnancy, unspecified, second trimester: Secondary | ICD-10-CM

## 2014-10-23 DIAGNOSIS — Z87891 Personal history of nicotine dependence: Secondary | ICD-10-CM | POA: Diagnosis not present

## 2014-10-23 DIAGNOSIS — R509 Fever, unspecified: Secondary | ICD-10-CM | POA: Diagnosis not present

## 2014-10-23 DIAGNOSIS — Z833 Family history of diabetes mellitus: Secondary | ICD-10-CM | POA: Diagnosis not present

## 2014-10-23 DIAGNOSIS — R0602 Shortness of breath: Secondary | ICD-10-CM

## 2014-10-23 DIAGNOSIS — T50905A Adverse effect of unspecified drugs, medicaments and biological substances, initial encounter: Secondary | ICD-10-CM

## 2014-10-23 DIAGNOSIS — D571 Sickle-cell disease without crisis: Secondary | ICD-10-CM | POA: Diagnosis not present

## 2014-10-23 DIAGNOSIS — D72829 Elevated white blood cell count, unspecified: Secondary | ICD-10-CM | POA: Diagnosis not present

## 2014-10-23 DIAGNOSIS — Z452 Encounter for adjustment and management of vascular access device: Secondary | ICD-10-CM | POA: Diagnosis not present

## 2014-10-23 DIAGNOSIS — B961 Klebsiella pneumoniae [K. pneumoniae] as the cause of diseases classified elsewhere: Secondary | ICD-10-CM | POA: Diagnosis not present

## 2014-10-23 DIAGNOSIS — D5700 Hb-SS disease with crisis, unspecified: Secondary | ICD-10-CM | POA: Diagnosis not present

## 2014-10-23 DIAGNOSIS — Z3A31 31 weeks gestation of pregnancy: Secondary | ICD-10-CM | POA: Diagnosis present

## 2014-10-23 DIAGNOSIS — Y838 Other surgical procedures as the cause of abnormal reaction of the patient, or of later complication, without mention of misadventure at the time of the procedure: Secondary | ICD-10-CM | POA: Diagnosis not present

## 2014-10-23 DIAGNOSIS — Z3A36 36 weeks gestation of pregnancy: Secondary | ICD-10-CM | POA: Diagnosis not present

## 2014-10-23 DIAGNOSIS — F329 Major depressive disorder, single episode, unspecified: Secondary | ICD-10-CM | POA: Diagnosis present

## 2014-10-23 DIAGNOSIS — O99013 Anemia complicating pregnancy, third trimester: Secondary | ICD-10-CM | POA: Diagnosis not present

## 2014-10-23 DIAGNOSIS — Z8249 Family history of ischemic heart disease and other diseases of the circulatory system: Secondary | ICD-10-CM | POA: Diagnosis not present

## 2014-10-23 DIAGNOSIS — G8929 Other chronic pain: Secondary | ICD-10-CM

## 2014-10-23 DIAGNOSIS — T8579XA Infection and inflammatory reaction due to other internal prosthetic devices, implants and grafts, initial encounter: Secondary | ICD-10-CM

## 2014-10-23 DIAGNOSIS — O98813 Other maternal infectious and parasitic diseases complicating pregnancy, third trimester: Secondary | ICD-10-CM | POA: Diagnosis not present

## 2014-10-23 DIAGNOSIS — T827XXA Infection and inflammatory reaction due to other cardiac and vascular devices, implants and grafts, initial encounter: Secondary | ICD-10-CM

## 2014-10-23 DIAGNOSIS — O99343 Other mental disorders complicating pregnancy, third trimester: Secondary | ICD-10-CM | POA: Diagnosis present

## 2014-10-23 DIAGNOSIS — O99283 Endocrine, nutritional and metabolic diseases complicating pregnancy, third trimester: Secondary | ICD-10-CM | POA: Diagnosis present

## 2014-10-23 DIAGNOSIS — B9689 Other specified bacterial agents as the cause of diseases classified elsewhere: Secondary | ICD-10-CM | POA: Diagnosis not present

## 2014-10-23 DIAGNOSIS — Z959 Presence of cardiac and vascular implant and graft, unspecified: Secondary | ICD-10-CM | POA: Diagnosis not present

## 2014-10-23 DIAGNOSIS — R7881 Bacteremia: Secondary | ICD-10-CM | POA: Insufficient documentation

## 2014-10-23 DIAGNOSIS — E876 Hypokalemia: Secondary | ICD-10-CM | POA: Clinically undetermined

## 2014-10-23 DIAGNOSIS — M549 Dorsalgia, unspecified: Secondary | ICD-10-CM | POA: Diagnosis present

## 2014-10-23 DIAGNOSIS — A419 Sepsis, unspecified organism: Secondary | ICD-10-CM | POA: Diagnosis not present

## 2014-10-23 DIAGNOSIS — T80219A Unspecified infection due to central venous catheter, initial encounter: Secondary | ICD-10-CM | POA: Diagnosis not present

## 2014-10-23 DIAGNOSIS — O99613 Diseases of the digestive system complicating pregnancy, third trimester: Secondary | ICD-10-CM | POA: Diagnosis present

## 2014-10-23 LAB — URINALYSIS, ROUTINE W REFLEX MICROSCOPIC
Bilirubin Urine: NEGATIVE
Glucose, UA: NEGATIVE mg/dL
Hgb urine dipstick: NEGATIVE
Ketones, ur: NEGATIVE mg/dL
NITRITE: NEGATIVE
Protein, ur: NEGATIVE mg/dL
Specific Gravity, Urine: 1.015 (ref 1.005–1.030)
Urobilinogen, UA: 1 mg/dL (ref 0.0–1.0)
pH: 6 (ref 5.0–8.0)

## 2014-10-23 LAB — URINE MICROSCOPIC-ADD ON

## 2014-10-23 LAB — BASIC METABOLIC PANEL
Anion gap: 4 — ABNORMAL LOW (ref 5–15)
CO2: 20 mmol/L — ABNORMAL LOW (ref 22–32)
CREATININE: 0.4 mg/dL — AB (ref 0.44–1.00)
Calcium: 8.6 mg/dL — ABNORMAL LOW (ref 8.9–10.3)
Chloride: 110 mmol/L (ref 101–111)
GFR calc Af Amer: >60 mL/min (ref >60)
Glucose, Bld: 78 mg/dL (ref 65–99)
Potassium: 3.8 mmol/L (ref 3.5–5.1)
Sodium: 134 mmol/L — ABNORMAL LOW (ref 135–145)

## 2014-10-23 LAB — CBC
HCT: 27.2 % — ABNORMAL LOW (ref 36.0–46.0)
Hemoglobin: 9.6 g/dL — ABNORMAL LOW (ref 12.0–15.0)
MCH: 29.3 pg (ref 26.0–34.0)
MCHC: 35.3 g/dL (ref 30.0–36.0)
MCV: 82.9 fL (ref 78.0–100.0)
PLATELETS: 361 10*3/uL (ref 150–400)
RBC: 3.28 MIL/uL — AB (ref 3.87–5.11)
RDW: 20.9 % — ABNORMAL HIGH (ref 11.5–15.5)
WBC: 16.3 10*3/uL — ABNORMAL HIGH (ref 4.0–10.5)

## 2014-10-23 MED ORDER — SODIUM CHLORIDE 0.9 % IJ SOLN
10.0000 mL | Freq: Two times a day (BID) | INTRAMUSCULAR | Status: DC
  Administered 2014-10-24 – 2014-10-30 (×9): 10 mL
  Filled 2014-10-23 (×4): qty 40

## 2014-10-23 MED ORDER — HYDROMORPHONE HCL 2 MG/ML IJ SOLN
2.0000 mg | Freq: Once | INTRAMUSCULAR | Status: AC
  Administered 2014-10-23: 2 mg via INTRAVENOUS
  Filled 2014-10-23: qty 1

## 2014-10-23 MED ORDER — SODIUM CHLORIDE 0.45 % IV SOLN: INTRAVENOUS | Status: DC

## 2014-10-23 MED ORDER — ONDANSETRON HCL 4 MG/2ML IJ SOLN
4.0000 mg | Freq: Four times a day (QID) | INTRAMUSCULAR | Status: DC | PRN
  Administered 2014-10-24 – 2014-10-26 (×3): 4 mg via INTRAVENOUS
  Filled 2014-10-23 (×3): qty 2

## 2014-10-23 MED ORDER — SODIUM CHLORIDE 0.9 % IV SOLN
INTRAVENOUS | Status: DC
  Administered 2014-10-23 – 2014-10-30 (×19): via INTRAVENOUS

## 2014-10-23 MED ORDER — ACETAMINOPHEN 325 MG PO TABS
650.0000 mg | ORAL_TABLET | ORAL | Status: DC | PRN
  Administered 2014-10-25 – 2014-10-27 (×4): 650 mg via ORAL
  Filled 2014-10-23 (×5): qty 2

## 2014-10-23 MED ORDER — SODIUM CHLORIDE 0.9 % IJ SOLN: 9.0000 mL | INTRAMUSCULAR | Status: DC | PRN

## 2014-10-23 MED ORDER — CALCIUM CARBONATE ANTACID 500 MG PO CHEW
2.0000 | CHEWABLE_TABLET | ORAL | Status: DC | PRN
  Administered 2014-10-27: 400 mg via ORAL
  Filled 2014-10-23: qty 2

## 2014-10-23 MED ORDER — NALOXONE HCL 0.4 MG/ML IJ SOLN: 0.4000 mg | INTRAMUSCULAR | Status: DC | PRN

## 2014-10-23 MED ORDER — SODIUM CHLORIDE 0.9 % IV BOLUS (SEPSIS)
1000.0000 mL | Freq: Once | INTRAVENOUS | Status: AC
  Administered 2014-10-23: 1000 mL via INTRAVENOUS

## 2014-10-23 MED ORDER — FOLIC ACID 1 MG PO TABS
2.0000 mg | ORAL_TABLET | Freq: Every day | ORAL | Status: DC
  Administered 2014-10-24 – 2014-10-30 (×6): 2 mg via ORAL
  Filled 2014-10-23 (×8): qty 2

## 2014-10-23 MED ORDER — DOCUSATE SODIUM 100 MG PO CAPS
100.0000 mg | ORAL_CAPSULE | Freq: Every day | ORAL | Status: DC
  Administered 2014-10-24 – 2014-10-27 (×3): 100 mg via ORAL
  Filled 2014-10-23 (×3): qty 1

## 2014-10-23 MED ORDER — ZOLPIDEM TARTRATE 5 MG PO TABS: 5.0000 mg | ORAL_TABLET | Freq: Every evening | ORAL | Status: DC | PRN

## 2014-10-23 MED ORDER — DOCUSATE SODIUM 100 MG PO CAPS
100.0000 mg | ORAL_CAPSULE | Freq: Two times a day (BID) | ORAL | Status: DC
Start: 2014-10-23 — End: 2014-10-30
  Administered 2014-10-23 – 2014-10-29 (×9): 100 mg via ORAL
  Filled 2014-10-23 (×10): qty 1

## 2014-10-23 MED ORDER — DIPHENHYDRAMINE HCL 12.5 MG/5ML PO ELIX
12.5000 mg | ORAL_SOLUTION | Freq: Four times a day (QID) | ORAL | Status: DC | PRN
  Administered 2014-10-24: 12.5 mg via ORAL
  Filled 2014-10-23 (×2): qty 5

## 2014-10-23 MED ORDER — METHADONE HCL 10 MG PO TABS
5.0000 mg | ORAL_TABLET | Freq: Every day | ORAL | Status: DC
  Administered 2014-10-23 – 2014-10-29 (×7): 5 mg via ORAL
  Filled 2014-10-23 (×7): qty 1

## 2014-10-23 MED ORDER — DIPHENHYDRAMINE HCL 50 MG/ML IJ SOLN
12.5000 mg | Freq: Four times a day (QID) | INTRAMUSCULAR | Status: DC | PRN
  Administered 2014-10-24 – 2014-10-26 (×7): 12.5 mg via INTRAVENOUS
  Filled 2014-10-23 (×8): qty 1

## 2014-10-23 MED ORDER — HYDROMORPHONE HCL 2 MG/ML IJ SOLN
2.0000 mg | Freq: Once | INTRAMUSCULAR | Status: AC
Start: 2014-10-23 — End: 2014-10-23
  Administered 2014-10-23: 2 mg via INTRAVENOUS
  Filled 2014-10-23: qty 1

## 2014-10-23 MED ORDER — SODIUM CHLORIDE 0.9 % IJ SOLN
10.0000 mL | INTRAMUSCULAR | Status: DC | PRN
  Administered 2014-10-25 – 2014-10-30 (×5): 10 mL
  Filled 2014-10-23 (×9): qty 40

## 2014-10-23 MED ORDER — HYDROMORPHONE 0.3 MG/ML IV SOLN
INTRAVENOUS | Status: DC
  Administered 2014-10-23 – 2014-10-24 (×2): via INTRAVENOUS
  Administered 2014-10-24: 5.19 mg via INTRAVENOUS
  Administered 2014-10-24: 4.19 mg via INTRAVENOUS
  Administered 2014-10-24: 3.5 mg via INTRAVENOUS
  Administered 2014-10-24: 04:00:00 via INTRAVENOUS
  Administered 2014-10-24: 3.88 mg via INTRAVENOUS
  Administered 2014-10-24: 4.19 mg via INTRAVENOUS
  Administered 2014-10-24: 16:00:00 via INTRAVENOUS
  Administered 2014-10-24: 7.6 mg via INTRAVENOUS
  Administered 2014-10-24: 22:00:00 via INTRAVENOUS
  Administered 2014-10-25: 3.5 mg via INTRAVENOUS
  Administered 2014-10-25: 4.14 mg via INTRAVENOUS
  Filled 2014-10-23 (×6): qty 25

## 2014-10-23 MED ORDER — OXYCODONE HCL 5 MG PO TABS
15.0000 mg | ORAL_TABLET | Freq: Once | ORAL | Status: AC
  Administered 2014-10-23: 15 mg via ORAL
  Filled 2014-10-23: qty 3

## 2014-10-23 MED ORDER — PRENATAL MULTIVITAMIN CH
1.0000 | ORAL_TABLET | Freq: Every day | ORAL | Status: DC
  Filled 2014-10-23 (×2): qty 1

## 2014-10-23 NOTE — MAU Note (Signed)
Pt c/o back pain and shoulder joint pain and right elbow. Has appointment for blood transfusion tomorrow. Stated her docotor told her to do it here today.

## 2014-10-23 NOTE — MAU Note (Signed)
Called AICU nurse to access PICC.

## 2014-10-23 NOTE — Telephone Encounter (Signed)
Refill request for oxycodone 15mg  and methadone 5mg . LOV 07/21/2014. Please advise. Thanks!

## 2014-10-23 NOTE — H&P (Signed)
History     CSN: [id number]  Arrival date and time: 10/23/14 1540   First Provider Initiated Contact with Patient 10/23/14 1639      Chief Complaint  Patient presents with  . Sickle Cell Pain Crisis   Sickle Cell Pain Crisis Pertinent negatives include no chest pain, chills, fever, headaches, nausea, rash or vomiting.   [name] is a 24yo G2P0010 at [redacted]w[redacted]d who is well know to our service. 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 She reports being in contact with Dr. Zigmund Daniel earlier today who recommended she taking extra dose of her pain medicine and drinking 2 L of fluid due to her increased level of pain. When this did not control her pain, she presented to the hospital for evaluation at the request of Dr. Zigmund Daniel. She describes sharp pain in her right upper back, right shoulder and elbow.   Pain is worse with movement. Denies any chest pain, shortness of breath, abdominal pain or lower extremity pain. She reports she has generally been compliant with home methadone (does admit to missing some doses) and using oral oxycodone as needed to control pain. She had been discharged 8 days ago after admission for sickle cell crisis requiring regular IV pain medication and IV fluids. Patient reports she has been feeling well since discharge. Reports she was due for transfusion at her hematologist office tomorrow. She called them to report pain and they recommended urgent evaluation with transfusion if admitted.   Patient continues to feel regular fetal movement, no contractions, no VB, LOF or dysuria.   OB History    Gravida Para Term Preterm AB TAB SAB Ectopic Multiple Living   2    1  1    0      Obstetric Comments   Miscarried in October 2012 at about 7 weeks      Past Medical History  Diagnosis Date  . Miscarriage 03/22/2011    Pt reports 2 miscarriages.  . Depression 01/06/2011  . GERD (gastroesophageal reflux disease) 02/17/2011  .  Trichotillomania     h/o  . Blood transfusion     "lots"  . Sickle cell anemia with crisis   . Exertional dyspnea     "sometimes"  . Migraines 11/08/11    "@ least twice/month"  . Chronic back pain     "very severe; have knot in my back; from tight muscle; take RX and exercise for it"  . Mood swings 11/08/11    "I go back and forth; real bad"  . Sickle cell anemia     Past Surgical History  Procedure Laterality Date  . Cholecystectomy  05/2010  . Dilation and curettage of uterus  02/20/11    S/P miscarriage    Family History  Problem Relation Age of Onset  . Alcoholism    . Depression    . Hypercholesterolemia    . Hypertension    . Migraines    . Diabetes Maternal Grandmother   . Diabetes Paternal Grandmother   . Diabetes Maternal Grandfather   . Sickle cell trait Mother   . Sickle cell trait Father     History  Substance Use Topics  . Smoking status: Former Smoker -- 0.25 packs/day for 1 years    Types: Cigarettes    Quit date: 03/25/2013  . Smokeless tobacco: Never Used  . Alcohol Use: No     Comment: pt states she quit marijuan in May 2013. Rare ETOH, + cigarettes.  She is enrolled in school    Allergies:  Allergies  Allergen Reactions  . Carrot [Daucus Carota] Hives, Swelling and Rash    Dietary:  Please do not send any form of carrot to patient  . Carrot Oil Hives and Swelling  . Latex Rash    Facility-administered medications prior to admission  Medication Dose Route Frequency Provider Last Rate Last Dose  . 0.9 %  sodium chloride infusion   Intravenous Once [name], MD       Prescriptions prior to admission  Medication Sig Dispense Refill Last Dose  . acetaminophen (TYLENOL) 500 MG tablet Take 1,000 mg by mouth every 6 (six) hours as needed for moderate pain or headache.   Past Month at Unknown time  . docusate sodium (COLACE) 100 MG capsule Take 1 capsule (100 mg total) by mouth daily. (Patient taking differently: Take 100 mg by mouth  daily as needed for mild constipation. ) 30 capsule prn Past Month at Unknown time  . folic acid (FOLVITE) 1 MG tablet Take 2 tablets (2 mg total) by mouth daily. (Patient taking differently: Take 1 mg by mouth daily. ) 60 tablet 2 10/23/2014 at Unknown time  . methadone (DOLOPHINE) 5 MG tablet Take 1 tablet (5 mg total) by mouth at bedtime. 30 tablet 0 10/22/2014 at Unknown time  . oxyCODONE (ROXICODONE) 15 MG immediate release tablet Take 1 tablet (15 mg total) by mouth every 4 (four) hours as needed for pain. 90 tablet 0 10/23/2014 at Unknown time  . Prenatal Vit-Min-FA-Fish Oil ([employer] PRENATAL GUMMY) 0.4-113.5 MG CHEW Chew 2 each by mouth daily.   10/23/2014 at Unknown time    Review of Systems  Constitutional: Negative for fever and chills.  Eyes: Negative for blurred vision and double vision.  Respiratory: Negative for shortness of breath.   Cardiovascular: Negative for chest pain and leg swelling.  Gastrointestinal: Negative for heartburn, nausea, vomiting, diarrhea and constipation.  Genitourinary: Negative for dysuria and urgency.       No contractions, vaginal bleeding, itching or discharge  Musculoskeletal: Positive for back pain and joint pain.  Skin: Negative for itching and rash.  Neurological: Negative for dizziness and headaches.   Physical Exam   Blood pressure 113/69, pulse 89, temperature 98.3 F (36.8 C), temperature source Oral, resp. rate 18, height 5\' 2"  (1.575 m), weight 163 lb 9.6 oz (74.208 kg), last menstrual period 03/20/2014, SpO2 98 %.  Physical Exam  Constitutional: She is oriented to person, place, and time. She appears well-developed and well-nourished.  Sitting up in bed, slightly uncomfortable  HENT:  Head: Normocephalic and atraumatic.  Eyes: Conjunctivae are normal.  Neck: Normal range of motion. Neck supple. No thyromegaly present.  Cardiovascular: Normal rate, regular rhythm, normal heart sounds and intact distal pulses.   No murmur heard. Respiratory:  Effort normal and breath sounds normal. No respiratory distress. She has no wheezes.  GI: Soft. Bowel sounds are normal. She exhibits no distension. There is no tenderness.  Musculoskeletal: She exhibits no edema.  Tender to light palpation throughout RUE, R shoulder and mid-upper back. No LE tenderness  Lymphadenopathy:    She has no cervical adenopathy.  Neurological: She is alert and oriented to person, place, and time. She has normal reflexes.  Skin: Skin is warm and dry.  Psychiatric: She has a normal mood and affect.   FHT: Category I, baseline 130, accelerations present, no decelerations Toco: No contractions  Results for orders placed or performed during the hospital  encounter of 10/23/14 (from the past 24 hour(s))  CBC     Status: Abnormal   Collection Time: 10/23/14  5:01 PM  Result Value Ref Range   WBC 16.3 (H) 4.0 - 10.5 K/uL   RBC 3.28 (L) 3.87 - 5.11 MIL/uL   Hemoglobin 9.6 (L) 12.0 - 15.0 g/dL   HCT 27.2 (L) 36.0 - 46.0 %   MCV 82.9 78.0 - 100.0 fL   MCH 29.3 26.0 - 34.0 pg   MCHC 35.3 30.0 - 36.0 g/dL   RDW 20.9 (H) 11.5 - 15.5 %   Platelets 361 150 - 400 K/uL  Basic metabolic panel     Status: Abnormal   Collection Time: 10/23/14  5:01 PM  Result Value Ref Range   Sodium 134 (L) 135 - 145 mmol/L   Potassium 3.8 3.5 - 5.1 mmol/L   Chloride 110 101 - 111 mmol/L   CO2 20 (L) 22 - 32 mmol/L   Glucose, Bld 78 65 - 99 mg/dL   BUN <5 (L) 6 - 20 mg/dL   Creatinine, Ser 0.40 (L) 0.44 - 1.00 mg/dL   Calcium 8.6 (L) 8.9 - 10.3 mg/dL   GFR calc non Af Amer >60 >60 mL/min   GFR calc Af Amer >60 >60 mL/min   Anion gap 4 (L) 5 - 15  Urinalysis, Routine w reflex microscopic (not at [facility])     Status: Abnormal   Collection Time: 10/23/14  5:45 PM  Result Value Ref Range   Color, Urine YELLOW YELLOW   APPearance CLEAR CLEAR   Specific Gravity, Urine 1.015 1.005 - 1.030   pH 6.0 5.0 - 8.0   Glucose, UA NEGATIVE NEGATIVE mg/dL   Hgb urine dipstick NEGATIVE NEGATIVE    Bilirubin Urine NEGATIVE NEGATIVE   Ketones, ur NEGATIVE NEGATIVE mg/dL   Protein, ur NEGATIVE NEGATIVE mg/dL   Urobilinogen, UA 1.0 0.0 - 1.0 mg/dL   Nitrite NEGATIVE NEGATIVE   Leukocytes, UA TRACE (A) NEGATIVE  Urine microscopic-add on     Status: Abnormal   Collection Time: 10/23/14  5:45 PM  Result Value Ref Range   Squamous Epithelial / LPF FEW (A) RARE   WBC, UA 3-6 <3 WBC/hpf   RBC / HPF 0-2 <3 RBC/hpf   Bacteria, UA FEW (A) RARE     MAU Course  Procedures  MDM   Assessment and Plan  Assessment/plan #1 sickle cell crisis #2 opiate tolerance #3 IUP at 31 weeks  It does appear that the patient has the beginnings of crisis, particularly with a slightly elevated white count of 16k, despite her hemoglobin being normal. Her pain is not well controlled despite attempts to hydrate her through IV fluid. Additionally, pain not controlled on medications. Admit to antenatal I discussed her PCA dose with the pharmacist: in previous admissions, her Dilaudid PCA is started at the following settings: Bolus 0.7 mg, lockout 10 minutes, limit 4.2 mg one hour - we will continue this IV fluids overnight Consult sickle cell team in the morning We'll recheck CBC in the morning. Will transfuse if hemoglobin drops below 7.5, unless recommended by sickle cell team. For the pregnancy, will obtain NST every shift Continue prenatal vitamin   [name] JEHIEL 10/23/2014, 9:57 PM

## 2014-10-23 NOTE — MAU Provider Note (Signed)
History     CSN: [id number]  Arrival date and time: 10/23/14 1540   First Provider Initiated Contact with Patient 10/23/14 1639      Chief Complaint  Patient presents with  . Sickle Cell Pain Crisis   HPI [name] is a 24 y.o. G2P0010 at [redacted]w[redacted]d presenting with complaint of sickle cell crisis. Reports pain started getting more severe around 9 am today. She reports significant pain in her R shoulder, arm and elbow. She has tried oral hydration and oral pain medications at home today without improvement. Pain is worse with movement. Denies any chest pain, shortness of breath, abdominal pain or lower extremity pain. She reports she has generally been compliant with home methadone (does admit to missing some doses) and using oral oxycodone as needed to control pain. She had been discharged 8 days ago after admission for sickle cell crisis requiring regular IV pain medication and IV fluids. Patient reports she has been feeling well since discharge. Reports she was due for transfusion at her hematologist office tomorrow. She called them to report pain and they recommended urgent evaluation with transfusion if admitted.   Patient continues to feel regular fetal movement, no contractions, no VB, LOF or dysuria.   OB History    Gravida Para Term Preterm AB TAB SAB Ectopic Multiple Living   2    1  1    0      Obstetric Comments   Miscarried in October 2012 at about 7 weeks      Past Medical History  Diagnosis Date  . Miscarriage 03/22/2011    Pt reports 2 miscarriages.  . Depression 01/06/2011  . GERD (gastroesophageal reflux disease) 02/17/2011  . Trichotillomania     h/o  . Blood transfusion     "lots"  . Sickle cell anemia with crisis   . Exertional dyspnea     "sometimes"  . Migraines 11/08/11    "@ least twice/month"  . Chronic back pain     "very severe; have knot in my back; from tight muscle; take RX and exercise for it"  . Mood swings 11/08/11    "I go back and forth; real bad"   . Sickle cell anemia     Past Surgical History  Procedure Laterality Date  . Cholecystectomy  05/2010  . Dilation and curettage of uterus  02/20/11    S/P miscarriage    Family History  Problem Relation Age of Onset  . Alcoholism    . Depression    . Hypercholesterolemia    . Hypertension    . Migraines    . Diabetes Maternal Grandmother   . Diabetes Paternal Grandmother   . Diabetes Maternal Grandfather   . Sickle cell trait Mother   . Sickle cell trait Father     History  Substance Use Topics  . Smoking status: Former Smoker -- 0.25 packs/day for 1 years    Types: Cigarettes    Quit date: 03/25/2013  . Smokeless tobacco: Never Used  . Alcohol Use: No     Comment: pt states she quit marijuan in May 2013. Rare ETOH, + cigarettes.  She is enrolled in school    Allergies:  Allergies  Allergen Reactions  . Carrot [Daucus Carota] Hives, Swelling and Rash    Dietary:  Please do not send any form of carrot to patient  . Carrot Oil Hives and Swelling  . Latex Rash    Facility-administered medications prior to admission  Medication Dose Route Frequency Provider  Last Rate Last Dose  . 0.9 %  sodium chloride infusion   Intravenous Once [name], MD       Prescriptions prior to admission  Medication Sig Dispense Refill Last Dose  . acetaminophen (TYLENOL) 500 MG tablet Take 1,000 mg by mouth every 6 (six) hours as needed for moderate pain or headache.   Taking  . docusate sodium (COLACE) 100 MG capsule Take 1 capsule (100 mg total) by mouth daily. 30 capsule prn Taking  . folic acid (FOLVITE) 1 MG tablet Take 2 tablets (2 mg total) by mouth daily. 60 tablet 2 Taking  . methadone (DOLOPHINE) 5 MG tablet Take 1 tablet (5 mg total) by mouth at bedtime. 30 tablet 0 Taking  . oxyCODONE (ROXICODONE) 15 MG immediate release tablet Take 1 tablet (15 mg total) by mouth every 4 (four) hours as needed for pain. 90 tablet 0 Taking  . Prenatal Vit-Min-FA-Fish Oil ([facility]  PRENATAL GUMMY) 0.4-113.5 MG CHEW Chew 2 each by mouth daily.   Taking    Review of Systems  Constitutional: Negative for fever and chills.  Eyes: Negative for blurred vision and double vision.  Respiratory: Negative for shortness of breath.   Cardiovascular: Negative for chest pain and leg swelling.  Gastrointestinal: Negative for heartburn, nausea, vomiting, diarrhea and constipation.  Genitourinary: Negative for dysuria and urgency.       No contractions, vaginal bleeding, itching or discharge  Musculoskeletal: Positive for back pain and joint pain.  Skin: Negative for itching and rash.  Neurological: Negative for dizziness and headaches.   Physical Exam   Blood pressure 117/68, pulse 102, temperature 98.8 F (37.1 C), temperature source Oral, resp. rate 18, height 5\' 2"  (1.575 m), weight 74.208 kg (163 lb 9.6 oz), last menstrual period 03/20/2014.  Physical Exam  Constitutional: She is oriented to person, place, and time. She appears well-developed and well-nourished.  Sitting up in bed, slightly uncomfortable  HENT:  Head: Normocephalic and atraumatic.  Eyes: Conjunctivae are normal.  Neck: Normal range of motion. Neck supple. No thyromegaly present.  Cardiovascular: Normal rate, regular rhythm, normal heart sounds and intact distal pulses.   No murmur heard. Respiratory: Effort normal and breath sounds normal. No respiratory distress. She has no wheezes.  GI: Soft. Bowel sounds are normal. She exhibits no distension. There is no tenderness.  Musculoskeletal: She exhibits no edema.  Tender to light palpation throughout RUE, R shoulder and mid-upper back. No LE tenderness  Lymphadenopathy:    She has no cervical adenopathy.  Neurological: She is alert and oriented to person, place, and time. She has normal reflexes.  Skin: Skin is warm and dry.  Psychiatric: She has a normal mood and affect.   FHT: Category I, baseline 130, accelerations present, no decelerations Toco: No  contractions   MAU Course  Procedures  MDM 24 y.o. G2P0010 at [redacted]w[redacted]d presenting with R upper extremity and back pain consistent with sickle cell crisis. Hgb 9.3 yesterday. Will get repeat labs to re-evaluate. Will trial IV and oral hydration, IV pain medications. Will monitor improvement with these measures. Patient will hopefully be able to d/c home if improving and f/u with hematologist tomorrow.   Laboratory evaluation: Hgb 9.6, improved from yesterday. Remainder of laboratory evaluation normal. Repeat vital signs:  Filed Vitals:   10/23/14 1933  BP: 111/70  Pulse: 115  Temp: 98.3 F (36.8 C)  Resp: 18     Patient reports initial pain relief from IV dilaudid 2 mg and IVF, pain  seems to be returning. Still localized to R arm/shoulder.   Assessment and Plan  A: [name] is a 24 y.o. G2P0010 at [redacted]w[redacted]d presenting with sickle cell crisis. Patient is given 2 L IVF bolus and IV dilaudid 2 mg x2 in addition to home oxycodone. She has improvement in her pain. Patient continues to appear clinically well and felt safe to discharge with close outpatient follow-up with her primary physician as scheduled tomorrow. Patient to continue home methadone, oxycodone and focus on oral hydration overnight.   P: Discharge to home Follow-up tomorrow with primary provider Continue home oxycodone and methadone   Patient history, exam, assessment and plan discussed with CNM.    Governor Specking 10/23/2014, 4:55 PM

## 2014-10-23 NOTE — Telephone Encounter (Signed)
Patient called in to advised she was being admitted to [facility] hospital and would not be able to make her procedure appointment tomorrow at 11:00.  IV team notified of cancellation.

## 2014-10-23 NOTE — MAU Provider Note (Deleted)
History     CSN: [id number]  Arrival date and time: 10/23/14 1540   First Provider Initiated Contact with Patient 10/23/14 1639      Chief Complaint  Patient presents with  . Sickle Cell Pain Crisis   Sickle Cell Pain Crisis Pertinent negatives include no chest pain, chills, fever, headaches, nausea, rash or vomiting.   [name] is a 24yo G2P0010 at [redacted]w[redacted]d who is well know to our service. 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 She reports being in contact with Dr. Zigmund Daniel earlier today who recommended she taking extra dose of her pain medicine and drinking 2 L of fluid due to her increased level of pain. When this did not control her pain, she presented to the hospital for evaluation at the request of Dr. Zigmund Daniel. She describes sharp pain in her right upper back, right shoulder and elbow.   Pain is worse with movement. Denies any chest pain, shortness of breath, abdominal pain or lower extremity pain. She reports she has generally been compliant with home methadone (does admit to missing some doses) and using oral oxycodone as needed to control pain. She had been discharged 8 days ago after admission for sickle cell crisis requiring regular IV pain medication and IV fluids. Patient reports she has been feeling well since discharge. Reports she was due for transfusion at her hematologist office tomorrow. She called them to report pain and they recommended urgent evaluation with transfusion if admitted.   Patient continues to feel regular fetal movement, no contractions, no VB, LOF or dysuria.   OB History    Gravida Para Term Preterm AB TAB SAB Ectopic Multiple Living   2    1  1    0      Obstetric Comments   Miscarried in October 2012 at about 7 weeks      Past Medical History  Diagnosis Date  . Miscarriage 03/22/2011    Pt reports 2 miscarriages.  . Depression 01/06/2011  . GERD (gastroesophageal reflux disease) 02/17/2011  .  Trichotillomania     h/o  . Blood transfusion     "lots"  . Sickle cell anemia with crisis   . Exertional dyspnea     "sometimes"  . Migraines 11/08/11    "@ least twice/month"  . Chronic back pain     "very severe; have knot in my back; from tight muscle; take RX and exercise for it"  . Mood swings 11/08/11    "I go back and forth; real bad"  . Sickle cell anemia     Past Surgical History  Procedure Laterality Date  . Cholecystectomy  05/2010  . Dilation and curettage of uterus  02/20/11    S/P miscarriage    Family History  Problem Relation Age of Onset  . Alcoholism    . Depression    . Hypercholesterolemia    . Hypertension    . Migraines    . Diabetes Maternal Grandmother   . Diabetes Paternal Grandmother   . Diabetes Maternal Grandfather   . Sickle cell trait Mother   . Sickle cell trait Father     History  Substance Use Topics  . Smoking status: Former Smoker -- 0.25 packs/day for 1 years    Types: Cigarettes    Quit date: 03/25/2013  . Smokeless tobacco: Never Used  . Alcohol Use: No     Comment: pt states she quit marijuan in May 2013. Rare ETOH, + cigarettes.  She is enrolled in school    Allergies:  Allergies  Allergen Reactions  . Carrot [Daucus Carota] Hives, Swelling and Rash    Dietary:  Please do not send any form of carrot to patient  . Carrot Oil Hives and Swelling  . Latex Rash    Facility-administered medications prior to admission  Medication Dose Route Frequency Provider Last Rate Last Dose  . 0.9 %  sodium chloride infusion   Intravenous Once [name], MD       Prescriptions prior to admission  Medication Sig Dispense Refill Last Dose  . acetaminophen (TYLENOL) 500 MG tablet Take 1,000 mg by mouth every 6 (six) hours as needed for moderate pain or headache.   Past Month at Unknown time  . docusate sodium (COLACE) 100 MG capsule Take 1 capsule (100 mg total) by mouth daily. (Patient taking differently: Take 100 mg by mouth  daily as needed for mild constipation. ) 30 capsule prn Past Month at Unknown time  . folic acid (FOLVITE) 1 MG tablet Take 2 tablets (2 mg total) by mouth daily. (Patient taking differently: Take 1 mg by mouth daily. ) 60 tablet 2 10/23/2014 at Unknown time  . methadone (DOLOPHINE) 5 MG tablet Take 1 tablet (5 mg total) by mouth at bedtime. 30 tablet 0 10/22/2014 at Unknown time  . oxyCODONE (ROXICODONE) 15 MG immediate release tablet Take 1 tablet (15 mg total) by mouth every 4 (four) hours as needed for pain. 90 tablet 0 10/23/2014 at Unknown time  . Prenatal Vit-Min-FA-Fish Oil ([facility] PRENATAL GUMMY) 0.4-113.5 MG CHEW Chew 2 each by mouth daily.   10/23/2014 at Unknown time    Review of Systems  Constitutional: Negative for fever and chills.  Eyes: Negative for blurred vision and double vision.  Respiratory: Negative for shortness of breath.   Cardiovascular: Negative for chest pain and leg swelling.  Gastrointestinal: Negative for heartburn, nausea, vomiting, diarrhea and constipation.  Genitourinary: Negative for dysuria and urgency.       No contractions, vaginal bleeding, itching or discharge  Musculoskeletal: Positive for back pain and joint pain.  Skin: Negative for itching and rash.  Neurological: Negative for dizziness and headaches.   Physical Exam   Blood pressure 113/69, pulse 89, temperature 98.3 F (36.8 C), temperature source Oral, resp. rate 18, height 5\' 2"  (1.575 m), weight 163 lb 9.6 oz (74.208 kg), last menstrual period 03/20/2014, SpO2 98 %.  Physical Exam  Constitutional: She is oriented to person, place, and time. She appears well-developed and well-nourished.  Sitting up in bed, slightly uncomfortable  HENT:  Head: Normocephalic and atraumatic.  Eyes: Conjunctivae are normal.  Neck: Normal range of motion. Neck supple. No thyromegaly present.  Cardiovascular: Normal rate, regular rhythm, normal heart sounds and intact distal pulses.   No murmur heard. Respiratory:  Effort normal and breath sounds normal. No respiratory distress. She has no wheezes.  GI: Soft. Bowel sounds are normal. She exhibits no distension. There is no tenderness.  Musculoskeletal: She exhibits no edema.  Tender to light palpation throughout RUE, R shoulder and mid-upper back. No LE tenderness  Lymphadenopathy:    She has no cervical adenopathy.  Neurological: She is alert and oriented to person, place, and time. She has normal reflexes.  Skin: Skin is warm and dry.  Psychiatric: She has a normal mood and affect.   FHT: Category I, baseline 130, accelerations present, no decelerations Toco: No contractions  Results for orders placed or performed during the hospital  encounter of 10/23/14 (from the past 24 hour(s))  CBC     Status: Abnormal   Collection Time: 10/23/14  5:01 PM  Result Value Ref Range   WBC 16.3 (H) 4.0 - 10.5 K/uL   RBC 3.28 (L) 3.87 - 5.11 MIL/uL   Hemoglobin 9.6 (L) 12.0 - 15.0 g/dL   HCT 27.2 (L) 36.0 - 46.0 %   MCV 82.9 78.0 - 100.0 fL   MCH 29.3 26.0 - 34.0 pg   MCHC 35.3 30.0 - 36.0 g/dL   RDW 20.9 (H) 11.5 - 15.5 %   Platelets 361 150 - 400 K/uL  Basic metabolic panel     Status: Abnormal   Collection Time: 10/23/14  5:01 PM  Result Value Ref Range   Sodium 134 (L) 135 - 145 mmol/L   Potassium 3.8 3.5 - 5.1 mmol/L   Chloride 110 101 - 111 mmol/L   CO2 20 (L) 22 - 32 mmol/L   Glucose, Bld 78 65 - 99 mg/dL   BUN <5 (L) 6 - 20 mg/dL   Creatinine, Ser 0.40 (L) 0.44 - 1.00 mg/dL   Calcium 8.6 (L) 8.9 - 10.3 mg/dL   GFR calc non Af Amer >60 >60 mL/min   GFR calc Af Amer >60 >60 mL/min   Anion gap 4 (L) 5 - 15  Urinalysis, Routine w reflex microscopic (not at [facility])     Status: Abnormal   Collection Time: 10/23/14  5:45 PM  Result Value Ref Range   Color, Urine YELLOW YELLOW   APPearance CLEAR CLEAR   Specific Gravity, Urine 1.015 1.005 - 1.030   pH 6.0 5.0 - 8.0   Glucose, UA NEGATIVE NEGATIVE mg/dL   Hgb urine dipstick NEGATIVE NEGATIVE    Bilirubin Urine NEGATIVE NEGATIVE   Ketones, ur NEGATIVE NEGATIVE mg/dL   Protein, ur NEGATIVE NEGATIVE mg/dL   Urobilinogen, UA 1.0 0.0 - 1.0 mg/dL   Nitrite NEGATIVE NEGATIVE   Leukocytes, UA TRACE (A) NEGATIVE  Urine microscopic-add on     Status: Abnormal   Collection Time: 10/23/14  5:45 PM  Result Value Ref Range   Squamous Epithelial / LPF FEW (A) RARE   WBC, UA 3-6 <3 WBC/hpf   RBC / HPF 0-2 <3 RBC/hpf   Bacteria, UA FEW (A) RARE     MAU Course  Procedures  MDM   Assessment and Plan  Assessment/plan #1 sickle cell crisis #2 opiate tolerance #3 IUP at 31 weeks  It does appear that the patient has the beginnings of crisis, particularly with a slightly elevated white count of 16k, despite her hemoglobin being normal. Her pain is not well controlled despite attempts to hydrate her through IV fluid. Additionally, pain not controlled on medications. Admit to antenatal I discussed her PCA dose with the pharmacist: in previous admissions, her Dilaudid PCA is started at the following settings: Bolus 0.7 mg, lockout 10 minutes, limit 4.2 mg one hour - we will continue this IV fluids overnight Consult sickle cell team in the morning We'll recheck CBC in the morning. Will transfuse if hemoglobin drops below 7.5, unless recommended by sickle cell team. For the pregnancy, will obtain NST every shift Continue prenatal vitamin   Ameya Vowell JEHIEL 10/23/2014, 9:57 PM

## 2014-10-23 NOTE — Telephone Encounter (Signed)
Spoke with Amy about monitoring patient prior to exchange transfusion.  She will send a nurse over tomorrow 10-24-2014 between 10-10:15.

## 2014-10-23 NOTE — Discharge Instructions (Signed)
Preterm Labor Information °Preterm labor is when labor starts at less than 37 weeks of pregnancy. The normal length of a pregnancy is 39 to 41 weeks. °CAUSES °Often, there is no identifiable underlying cause as to why a woman goes into preterm labor. One of the most common known causes of preterm labor is infection. Infections of the uterus, cervix, vagina, amniotic sac, bladder, kidney, or even the lungs (pneumonia) can cause labor to start. Other suspected causes of preterm labor include:  °· Urogenital infections, such as yeast infections and bacterial vaginosis.   °· Uterine abnormalities (uterine shape, uterine septum, fibroids, or bleeding from the placenta).   °· A cervix that has been operated on (it may fail to stay closed).   °· Malformations in the fetus.   °· Multiple gestations (twins, triplets, and so on).   °· Breakage of the amniotic sac.   °RISK FACTORS °· Having a previous history of preterm labor.   °· Having premature rupture of membranes (PROM).   °· Having a placenta that covers the opening of the cervix (placenta previa).   °· Having a placenta that separates from the uterus (placental abruption).   °· Having a cervix that is too weak to hold the fetus in the uterus (incompetent cervix).   °· Having too much fluid in the amniotic sac (polyhydramnios).   °· Taking illegal drugs or smoking while pregnant.   °· Not gaining enough weight while pregnant.   °· Being younger than 18 and older than 23 years old.   °· Having a low socioeconomic status.   °· Being African American. °SYMPTOMS °Signs and symptoms of preterm labor include:  °· Menstrual-like cramps, abdominal pain, or back pain. °· Uterine contractions that are regular, as frequent as six in an hour, regardless of their intensity (may be mild or painful). °· Contractions that start on the top of the uterus and spread down to the lower abdomen and back.   °· A sense of increased pelvic pressure.   °· A watery or bloody mucus discharge that  comes from the vagina.   °TREATMENT °Depending on the length of the pregnancy and other circumstances, your health care provider may suggest bed rest. If necessary, there are medicines that can be given to stop contractions and to mature the fetal lungs. If labor happens before 34 weeks of pregnancy, a prolonged hospital stay may be recommended. Treatment depends on the condition of both you and the fetus.  °WHAT SHOULD YOU DO IF YOU THINK YOU ARE IN PRETERM LABOR? °Call your health care provider right away. You will need to go to the hospital to get checked immediately. °HOW CAN YOU PREVENT PRETERM LABOR IN FUTURE PREGNANCIES? °You should:  °· Stop smoking if you smoke.  °· Maintain healthy weight gain and avoid chemicals and drugs that are not necessary. °· Be watchful for any type of infection. °· Inform your health care provider if you have a known history of preterm labor. °Document Released: 07/23/2003 Document Revised: 01/02/2013 Document Reviewed: 06/04/2012 °[employer]® Patient Information ©2015 [employer]. This information is not intended to replace advice given to you by your health care provider. Make sure you discuss any questions you have with your health care provider. ° °

## 2014-10-24 ENCOUNTER — Ambulatory Visit (HOSPITAL_COMMUNITY): Payer: Medicare Other

## 2014-10-24 ENCOUNTER — Inpatient Hospital Stay (HOSPITAL_COMMUNITY): Payer: Medicare Other

## 2014-10-24 DIAGNOSIS — D5700 Hb-SS disease with crisis, unspecified: Secondary | ICD-10-CM

## 2014-10-24 LAB — CBC
HCT: 24.8 % — ABNORMAL LOW (ref 36.0–46.0)
HEMOGLOBIN: 8.7 g/dL — AB (ref 12.0–15.0)
MCH: 29.1 pg (ref 26.0–34.0)
MCHC: 35.1 g/dL (ref 30.0–36.0)
MCV: 82.9 fL (ref 78.0–100.0)
Platelets: 319 10*3/uL (ref 150–400)
RBC: 2.99 MIL/uL — AB (ref 3.87–5.11)
RDW: 20.8 % — AB (ref 11.5–15.5)
WBC: 17.1 10*3/uL — ABNORMAL HIGH (ref 4.0–10.5)

## 2014-10-24 MED ORDER — SODIUM CHLORIDE 0.9 % IV BOLUS (SEPSIS): 1000.0000 mL | Freq: Once | INTRAVENOUS | Status: DC

## 2014-10-24 MED ORDER — HYDROMORPHONE HCL 2 MG/ML IJ SOLN
2.0000 mg | INTRAMUSCULAR | Status: DC | PRN
  Administered 2014-10-24 – 2014-10-30 (×54): 2 mg via INTRAVENOUS
  Filled 2014-10-24 (×55): qty 1

## 2014-10-24 MED ORDER — SODIUM CHLORIDE 0.9 % IV BOLUS (SEPSIS)
500.0000 mL | Freq: Once | INTRAVENOUS | Status: AC
  Administered 2014-10-24: 500 mL via INTRAVENOUS

## 2014-10-24 MED ORDER — HYDROMORPHONE HCL 1 MG/ML IJ SOLN
1.0000 mg | INTRAMUSCULAR | Status: DC | PRN
  Administered 2014-10-24 (×2): 1 mg via INTRAVENOUS
  Filled 2014-10-24 (×2): qty 1

## 2014-10-24 NOTE — Progress Notes (Signed)
Patient ID: [name], female   DOB: 10-28-1990, 24 y.o.   MRN: [id number] [place]) NOTE  [name] is a 24 y.o. G2P0010 at [redacted]w[redacted]d by LMP who is admitted for sickle cell crisis.   Fetal presentation is unsure. Length of Stay:  1  Days  Subjective: Pain mostly upper body, no SOB Patient reports the fetal movement as active. Patient reports uterine contraction  activity as none. Patient reports  vaginal bleeding as none. Patient describes fluid per vagina as None.  Vitals:  Blood pressure 104/64, pulse 122, temperature 99.3 F (37.4 C), temperature source Oral, resp. rate 16, height 5\' 2"  (1.575 m), weight 163 lb (73.936 kg), last menstrual period 03/20/2014, SpO2 97 %. Physical Examination:  General appearance - in mild to moderate distress Heart - normal rate and regular rhythm Abdomen - soft, nontender, nondistended Fundal Height:  size equals dates Cervical Exam: Not evaluated. Extremities: extremities normal, atraumatic, no cyanosis or edema and Homans sign is negative, no sign of DVT  Membranes:intact  Fetal Monitoring:   Fetal Heart Rate A     Mode  External filed at 10/24/2014 0055    Baseline Rate (A)  145 bpm filed at 10/24/2014 0055    Variability  6-25 BPM filed at 10/24/2014 0055    Accelerations  10 x 10 filed at 10/24/2014 0055    Decelerations  None filed at 10/24/2014 0055        Labs:  Results for orders placed or performed during the hospital encounter of 10/23/14 (from the past 24 hour(s))  CBC   Collection Time: 10/23/14  5:01 PM  Result Value Ref Range   WBC 16.3 (H) 4.0 - 10.5 K/uL   RBC 3.28 (L) 3.87 - 5.11 MIL/uL   Hemoglobin 9.6 (L) 12.0 - 15.0 g/dL   HCT 27.2 (L) 36.0 - 46.0 %   MCV 82.9 78.0 - 100.0 fL   MCH 29.3 26.0 - 34.0 pg   MCHC 35.3 30.0 - 36.0 g/dL   RDW 20.9 (H) 11.5 - 15.5 %   Platelets 361 150 - 400 K/uL  Basic metabolic panel   Collection Time: 10/23/14  5:01 PM  Result  Value Ref Range   Sodium 134 (L) 135 - 145 mmol/L   Potassium 3.8 3.5 - 5.1 mmol/L   Chloride 110 101 - 111 mmol/L   CO2 20 (L) 22 - 32 mmol/L   Glucose, Bld 78 65 - 99 mg/dL   BUN <5 (L) 6 - 20 mg/dL   Creatinine, Ser 0.40 (L) 0.44 - 1.00 mg/dL   Calcium 8.6 (L) 8.9 - 10.3 mg/dL   GFR calc non Af Amer >60 >60 mL/min   GFR calc Af Amer >60 >60 mL/min   Anion gap 4 (L) 5 - 15  Urinalysis, Routine w reflex microscopic (not at [facility])   Collection Time: 10/23/14  5:45 PM  Result Value Ref Range   Color, Urine YELLOW YELLOW   APPearance CLEAR CLEAR   Specific Gravity, Urine 1.015 1.005 - 1.030   pH 6.0 5.0 - 8.0   Glucose, UA NEGATIVE NEGATIVE mg/dL   Hgb urine dipstick NEGATIVE NEGATIVE   Bilirubin Urine NEGATIVE NEGATIVE   Ketones, ur NEGATIVE NEGATIVE mg/dL   Protein, ur NEGATIVE NEGATIVE mg/dL   Urobilinogen, UA 1.0 0.0 - 1.0 mg/dL   Nitrite NEGATIVE NEGATIVE   Leukocytes, UA TRACE (A) NEGATIVE  Urine microscopic-add on   Collection Time: 10/23/14  5:45 PM  Result Value Ref Range  Squamous Epithelial / LPF FEW (A) RARE   WBC, UA 3-6 <3 WBC/hpf   RBC / HPF 0-2 <3 RBC/hpf   Bacteria, UA FEW (A) RARE  CBC   Collection Time: 10/24/14  5:40 AM  Result Value Ref Range   WBC 17.1 (H) 4.0 - 10.5 K/uL   RBC 2.99 (L) 3.87 - 5.11 MIL/uL   Hemoglobin 8.7 (L) 12.0 - 15.0 g/dL   HCT 24.8 (L) 36.0 - 46.0 %   MCV 82.9 78.0 - 100.0 fL   MCH 29.1 26.0 - 34.0 pg   MCHC 35.1 30.0 - 36.0 g/dL   RDW 20.8 (H) 11.5 - 15.5 %   Platelets 319 150 - 400 K/uL    Imaging Studies:     Currently EPIC will not allow sonographic studies to automatically populate into notes.  In the meantime, copy and paste results into note or free text.  Medications:  Scheduled . docusate sodium  100 mg Oral Daily  . docusate sodium  100 mg Oral BID  . folic acid  2 mg Oral Daily  . HYDROmorphone PCA 0.3 mg/mL   Intravenous 6 times per day  . methadone  5 mg Oral QHS  . prenatal multivitamin  1 tablet Oral  Q1200  . sodium chloride  10-40 mL Intracatheter Q12H   I have reviewed the patient's current medications.  ASSESSMENT: Patient Active Problem List   Diagnosis Date Noted  . Sickle cell crisis 10/23/2014  . 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 Supervision of high-risk pregnancy 06/11/2014  . Chronic pain 02/26/2013  . Anemia of chronic disease 10/05/2012  . 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 Mood swings 11/08/2011  . Migraines 11/08/2011  . Overweight(278.02) 05/24/2011  . GERD (gastroesophageal reflux disease) 02/17/2011  . Depression 01/06/2011  . Back pain 09/17/2010  . Sickle cell disease 01/08/2009  . 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  Archana Eckman 10/24/2014,9:06 AM

## 2014-10-24 NOTE — Progress Notes (Signed)
I spent time with pt after receiving a referral from her RN while rounding on the unit.  She was experiencing pain and so our visit was short.  She shared some of her stressors, including family dynamics and financial concerns as well as the stress that relates to sickle cell and other health issues.    She is aware of ongoing availability of chaplain support, but please also page as needs arise.    Pager, [phone] 5:08 PM    10/24/14 1700  Clinical Encounter Type  Visited With Patient  Visit Type Spiritual support  Referral From Nurse  Spiritual Encounters  Spiritual Needs Emotional

## 2014-10-24 NOTE — Consult Note (Signed)
Reason for Consult:Co-Management for Sickle cell disease and Crisis. Referring Physician: Dr Marlyce [name] is an 24 y.o. female.  HPI: [name] who is [redacted] weeks pregnant with sickle cell disease admitted with acute sickle cell crisis. Patient is complaining of 10/10 pain today all over. Has been able to walk to the bathroom. Pain is similar to her typical sickle cell crisis. Has no Shortness of breath, no cough, no NVD. Her pregnancy is uneventful. She is on Dilaudid PCA low dose and has used 23.69 mg with 39 demands and 33 deliveries in the last 24 hours. Has also been on her Methadone. Patient cannot get NSAIDS due to pregnancy.   Past Medical History  Diagnosis Date  . Miscarriage 03/22/2011    Pt reports 2 miscarriages.  . Depression 01/06/2011  . GERD (gastroesophageal reflux disease) 02/17/2011  . Trichotillomania     h/o  . Blood transfusion     "lots"  . Sickle cell anemia with crisis   . Exertional dyspnea     "sometimes"  . Migraines 11/08/11    "@ least twice/month"  . Chronic back pain     "very severe; have knot in my back; from tight muscle; take RX and exercise for it"  . Mood swings 11/08/11    "I go back and forth; real bad"  . Sickle cell anemia     Past Surgical History  Procedure Laterality Date  . Cholecystectomy  05/2010  . Dilation and curettage of uterus  02/20/11    S/P miscarriage    Family History  Problem Relation Age of Onset  . Alcoholism    . Depression    . Hypercholesterolemia    . Hypertension    . Migraines    . Diabetes Maternal Grandmother   . Diabetes Paternal Grandmother   . Diabetes Maternal Grandfather   . Sickle cell trait Mother   . Sickle cell trait Father     Social History:  reports that she quit smoking about 19 months ago. Her smoking use included Cigarettes. She has a .25 pack-year smoking history. She has never used smokeless tobacco. She reports that she does not drink alcohol or use illicit  drugs.  Allergies:  Allergies  Allergen Reactions  . Carrot [Daucus Carota] Hives, Swelling and Rash    Dietary:  Please do not send any form of carrot to patient  . Carrot Oil Hives and Swelling  . Latex Rash    Medications: I have reviewed the patient's current medications.  Results for orders placed or performed during the hospital encounter of 10/23/14 (from the past 48 hour(s))  CBC     Status: Abnormal   Collection Time: 10/23/14  5:01 PM  Result Value Ref Range   WBC 16.3 (H) 4.0 - 10.5 K/uL   RBC 3.28 (L) 3.87 - 5.11 MIL/uL   Hemoglobin 9.6 (L) 12.0 - 15.0 g/dL   HCT 27.2 (L) 36.0 - 46.0 %   MCV 82.9 78.0 - 100.0 fL   MCH 29.3 26.0 - 34.0 pg   MCHC 35.3 30.0 - 36.0 g/dL   RDW 20.9 (H) 11.5 - 15.5 %   Platelets 361 150 - 400 K/uL  Basic metabolic panel     Status: Abnormal   Collection Time: 10/23/14  5:01 PM  Result Value Ref Range   Sodium 134 (L) 135 - 145 mmol/L   Potassium 3.8 3.5 - 5.1 mmol/L   Chloride 110 101 - 111 mmol/L  CO2 20 (L) 22 - 32 mmol/L   Glucose, Bld 78 65 - 99 mg/dL   BUN <5 (L) 6 - 20 mg/dL    Comment: REPEATED TO VERIFY   Creatinine, Ser 0.40 (L) 0.44 - 1.00 mg/dL   Calcium 8.6 (L) 8.9 - 10.3 mg/dL   GFR calc non Af Amer >60 >60 mL/min   GFR calc Af Amer >60 >60 mL/min    Comment: (NOTE) The eGFR has been calculated using the CKD EPI equation. This calculation has not been validated in all clinical situations. eGFR's persistently <60 mL/min signify possible Chronic Kidney Disease.    Anion gap 4 (L) 5 - 15  Urinalysis, Routine w reflex microscopic (not at [facility])     Status: Abnormal   Collection Time: 10/23/14  5:45 PM  Result Value Ref Range   Color, Urine YELLOW YELLOW   APPearance CLEAR CLEAR   Specific Gravity, Urine 1.015 1.005 - 1.030   pH 6.0 5.0 - 8.0   Glucose, UA NEGATIVE NEGATIVE mg/dL   Hgb urine dipstick NEGATIVE NEGATIVE   Bilirubin Urine NEGATIVE NEGATIVE   Ketones, ur NEGATIVE NEGATIVE mg/dL   Protein, ur  NEGATIVE NEGATIVE mg/dL   Urobilinogen, UA 1.0 0.0 - 1.0 mg/dL   Nitrite NEGATIVE NEGATIVE   Leukocytes, UA TRACE (A) NEGATIVE  Urine microscopic-add on     Status: Abnormal   Collection Time: 10/23/14  5:45 PM  Result Value Ref Range   Squamous Epithelial / LPF FEW (A) RARE   WBC, UA 3-6 <3 WBC/hpf   RBC / HPF 0-2 <3 RBC/hpf   Bacteria, UA FEW (A) RARE  CBC     Status: Abnormal   Collection Time: 10/24/14  5:40 AM  Result Value Ref Range   WBC 17.1 (H) 4.0 - 10.5 K/uL   RBC 2.99 (L) 3.87 - 5.11 MIL/uL   Hemoglobin 8.7 (L) 12.0 - 15.0 g/dL   HCT 24.8 (L) 36.0 - 46.0 %   MCV 82.9 78.0 - 100.0 fL   MCH 29.1 26.0 - 34.0 pg   MCHC 35.1 30.0 - 36.0 g/dL   RDW 20.8 (H) 11.5 - 15.5 %   Platelets 319 150 - 400 K/uL    Comment: SPECIMEN CHECKED FOR CLOTS REPEATED TO VERIFY     No results found.  Review of Systems  Constitutional: Negative.   Eyes: Negative.   Respiratory: Negative.   Cardiovascular: Negative.   Gastrointestinal: Negative.   Genitourinary: Negative.  Negative for dysuria.  Musculoskeletal: Positive for myalgias, joint pain and neck pain.  Skin: Negative.   Neurological: Negative.   Endo/Heme/Allergies: Negative.   Psychiatric/Behavioral: Negative.    Blood pressure 121/68, pulse 122, temperature 98.7 F (37.1 C), temperature source Oral, resp. rate 22, height _0  (1.575 m), weight 73.936 kg (163 lb), last menstrual period 03/20/2014, SpO2 96 %. Physical Exam  Constitutional: She is oriented to person, place, and time. She appears well-developed and well-nourished.  HENT:  Head: Normocephalic and atraumatic.  Right Ear: External ear normal.  Left Ear: External ear normal.  Mouth/Throat: Oropharynx is clear and moist.  Eyes: Conjunctivae and EOM are normal. Pupils are equal, round, and reactive to light.  Neck: Normal range of motion. Neck supple.  Cardiovascular: Normal rate, regular rhythm, normal heart sounds and intact distal pulses.   Respiratory:  Effort normal and breath sounds normal.  GI: Soft. Bowel sounds are normal.  Musculoskeletal: Normal range of motion.  Neurological: She is alert and oriented to person, place, and  time. She has normal reflexes.  Skin: Skin is warm and dry.    Assessment/Plan: A 24 yo admitted with sickle cell painful crisis.  #1. Sickle cell Painful crisis: Patient on Dilaudid PCA and Q 2hours PRN dilaudid and Methadone. Will advise continuing with current regimen through tomorrow and once pain level is down to 6-7/10, we will DC PCA, start oral Oxycodone and keep overnight before DC.  #2. Sickle cell anemia: H/H stable. Continue to monitor. No evidence of acute hemolysis.  #3. Possible UTI: await urine culture. No fever, Leucocytosis may be due to Sickle cell crisis. No antibiotics for now.  #4. Leukocytosis: due to VOC. Continue to monitor.  Bentzion Dauria,LAWAL 10/24/2014, 5:25 PM

## 2014-10-24 NOTE — Telephone Encounter (Signed)
PT has not been seen in the clinic for 3 months to evaluate the effectiveness and safety of opiates which includes evaluation of QTc in light of Methadone use. She was advised at the time of her last prescription that she would need evaluation in the office before another prescription for opiates could be issued. She is currently hospitalized for a vaso-occlusive episode. I have again spoken wit her and communicated the above. She verbalizes understanding and will receive Methadone and Oxycodone at her next appointment as assessed.

## 2014-10-25 ENCOUNTER — Inpatient Hospital Stay (HOSPITAL_COMMUNITY): Payer: Medicare Other

## 2014-10-25 DIAGNOSIS — Z3A31 31 weeks gestation of pregnancy: Secondary | ICD-10-CM

## 2014-10-25 DIAGNOSIS — D5700 Hb-SS disease with crisis, unspecified: Secondary | ICD-10-CM

## 2014-10-25 DIAGNOSIS — A419 Sepsis, unspecified organism: Secondary | ICD-10-CM

## 2014-10-25 DIAGNOSIS — T80211A Bloodstream infection due to central venous catheter, initial encounter: Secondary | ICD-10-CM

## 2014-10-25 DIAGNOSIS — O99013 Anemia complicating pregnancy, third trimester: Secondary | ICD-10-CM

## 2014-10-25 DIAGNOSIS — M549 Dorsalgia, unspecified: Secondary | ICD-10-CM

## 2014-10-25 DIAGNOSIS — G8929 Other chronic pain: Secondary | ICD-10-CM

## 2014-10-25 LAB — CBC
HCT: 22.2 % — ABNORMAL LOW (ref 36.0–46.0)
HCT: 22.5 % — ABNORMAL LOW (ref 36.0–46.0)
Hemoglobin: 7.9 g/dL — ABNORMAL LOW (ref 12.0–15.0)
Hemoglobin: 7.9 g/dL — ABNORMAL LOW (ref 12.0–15.0)
MCH: 29.4 pg (ref 26.0–34.0)
MCH: 28.9 pg (ref 26.0–34.0)
MCHC: 35.6 g/dL (ref 30.0–36.0)
MCHC: 35.1 g/dL (ref 30.0–36.0)
MCV: 82.5 fL (ref 78.0–100.0)
MCV: 82.4 fL (ref 78.0–100.0)
PLATELETS: 279 10*3/uL (ref 150–400)
Platelets: 283 10*3/uL (ref 150–400)
RBC: 2.69 MIL/uL — ABNORMAL LOW (ref 3.87–5.11)
RBC: 2.73 MIL/uL — ABNORMAL LOW (ref 3.87–5.11)
RDW: 21 % — AB (ref 11.5–15.5)
RDW: 21.1 % — AB (ref 11.5–15.5)
WBC: 15.4 10*3/uL — ABNORMAL HIGH (ref 4.0–10.5)
WBC: 18.8 10*3/uL — ABNORMAL HIGH (ref 4.0–10.5)

## 2014-10-25 LAB — BLOOD GAS, ARTERIAL
Acid-base deficit: 4.6 mmol/L — ABNORMAL HIGH (ref 0.0–2.0)
BICARBONATE: 18.9 meq/L — AB (ref 20.0–24.0)
Drawn by: 143
PO2 ART: 71.9 mmHg — AB (ref 80.0–100.0)
TCO2: 19.8 mmol/L (ref 0–100)
pCO2 arterial: 30.5 mmHg — ABNORMAL LOW (ref 35.0–45.0)
pH, Arterial: 7.408 (ref 7.350–7.450)

## 2014-10-25 LAB — PREPARE RBC (CROSSMATCH)

## 2014-10-25 MED ORDER — ACETAMINOPHEN 325 MG PO TABS
650.0000 mg | ORAL_TABLET | Freq: Once | ORAL | Status: AC
  Administered 2014-10-25: 650 mg via ORAL
  Filled 2014-10-25: qty 2

## 2014-10-25 MED ORDER — HYDROMORPHONE 0.3 MG/ML IV SOLN
INTRAVENOUS | Status: DC
  Administered 2014-10-25: 09:00:00 via INTRAVENOUS
  Administered 2014-10-25: 1 mg via INTRAVENOUS
  Administered 2014-10-25 (×2): via INTRAVENOUS
  Administered 2014-10-25: 5.47 mg via INTRAVENOUS
  Administered 2014-10-25: 2.14 mg via INTRAVENOUS
  Administered 2014-10-26 (×2): via INTRAVENOUS
  Administered 2014-10-26: 3.99 mg via INTRAVENOUS
  Administered 2014-10-26: 3.79 mg via INTRAVENOUS
  Administered 2014-10-26: 1.5 mg via INTRAVENOUS
  Filled 2014-10-25 (×3): qty 25

## 2014-10-25 MED ORDER — ENSURE ENLIVE PO LIQD
237.0000 mL | Freq: Two times a day (BID) | ORAL | Status: DC
  Administered 2014-10-26 – 2014-10-30 (×7): 237 mL via ORAL
  Filled 2014-10-25 (×13): qty 237

## 2014-10-25 MED ORDER — SODIUM CHLORIDE 0.9 % IV SOLN
Freq: Once | INTRAVENOUS | Status: AC
  Administered 2014-10-25: 12:00:00 via INTRAVENOUS

## 2014-10-25 NOTE — Progress Notes (Signed)
MD informed of pt having chills and c/o being cold. Temp 99.2 and BP increased (taken while pt shivering). MD not concerned that it was a transfusion reaction due to the VS alone. Pt showing no signs of any problems until the very last 5 minutes of the blood transfusion.

## 2014-10-25 NOTE — Progress Notes (Signed)
Returning from [place] via w/c

## 2014-10-25 NOTE — Progress Notes (Signed)
RN staying at bedside after the pain medication dose, to assure that pt does not attempt to "bolus" her IVF after getting IV Dilaudid.

## 2014-10-25 NOTE — Progress Notes (Signed)
RN into room to reassess pain and IV fluids noted to be at a rate of 333cc/hr which had previously been at the correct rate of 125cc/hr.  IVF changed back to the correct infusion rate and IV pump "locked" via the lock button on the rear of the pump.

## 2014-10-25 NOTE — Progress Notes (Signed)
While preparing to give patient an additional dose of Dilaudid, RN noted that the IV pump had been taken off the lock status.

## 2014-10-25 NOTE — Progress Notes (Signed)
MD informed of pt HR up to 160 and temp now 101.0 orally. Pt continue to shiver and c/o wheezing with respirations in the 30's. Lung sounds clear. Per MD give her scheduled benadryl and another 650 mg dose of Tylenol.

## 2014-10-25 NOTE — Progress Notes (Signed)
MD notified of pt status and fever 103.0 oral. MD to order chest xray

## 2014-10-25 NOTE — Progress Notes (Signed)
Patient ID: [name], female   DOB: Nov 09, 1990, 24 y.o.   MRN: [id number] [place]) NOTE  [name] is a 24 y.o. G2P0010 at [redacted]w[redacted]d who is admitted for sickle cell crisis. Pt is much more uncomfortable this time, is on prn dosing q 2 h. .  She described this episode as "ten times worse" than last admit. She had an episode last nite of perceived chest discomfort: now improved, Cxr obtained Fetal presentation is cephalic. Length of Stay:  2  Days  Subjective: Pt was scheduled to receive 1 unit prbc thru sickle clinic, will go ahead now and transfuse x 1. Patient reports the fetal movement as active. Patient reports uterine contraction  activity as none. Patient reports  vaginal bleeding as none. Patient describes fluid per vagina as None.  Vitals:  Blood pressure 101/57, pulse 118, temperature 98.5 F (36.9 C), temperature source Oral, resp. rate 19, height 5\' 2"  (1.575 m), weight 73.936 kg (163 lb), last menstrual period 03/20/2014, SpO2 98 %. Physical Examination:  General appearance - oriented to person, place, and time, overweight, acyanotic, in no respiratory distress, in mild to moderate distress, ill-appearing and chronically ill appearing Heart - normal rate and regular rhythm Abdomen - soft, nontender, nondistended Fundal Height:  size equals dates Cervical Exam: Not evaluated. An Extremities: extremities normal, atraumatic, no cyanosis or edema and Homans sign is negative, no sign of DVT with DTRs 2+ bilaterally Membranes:intact  Fetal Monitoring:  Due this morning  Labs:  Results for orders placed or performed during the hospital encounter of 10/23/14 (from the past 24 hour(s))  CBC   Collection Time: 10/25/14  1:15 AM  Result Value Ref Range   WBC 15.4 (H) 4.0 - 10.5 K/uL   RBC 2.69 (L) 3.87 - 5.11 MIL/uL   Hemoglobin 7.9 (L) 12.0 - 15.0 g/dL   HCT 22.2 (L) 36.0 - 46.0 %   MCV 82.5 78.0 - 100.0 fL   MCH 29.4 26.0 - 34.0 pg    MCHC 35.6 30.0 - 36.0 g/dL   RDW 21.0 (H) 11.5 - 15.5 %   Platelets 283 150 - 400 K/uL  Blood gas, arterial   Collection Time: 10/25/14  1:38 AM  Result Value Ref Range   FIO2 ROOM AIR %   pH, Arterial 7.408 7.350 - 7.450   pCO2 arterial 30.5 (L) 35.0 - 45.0 mmHg   pO2, Arterial 71.9 (L) 80.0 - 100.0 mmHg   Bicarbonate 18.9 (L) 20.0 - 24.0 mEq/L   TCO2 19.8 0 - 100 mmol/L   Acid-base deficit 4.6 (H) 0.0 - 2.0 mmol/L   Collection site RADIAL    Drawn by 143    Sample type ARTERIAL    Allens test (pass/fail) PASS PASS  CBC   Collection Time: 10/25/14  5:55 AM  Result Value Ref Range   WBC 18.8 (H) 4.0 - 10.5 K/uL   RBC 2.73 (L) 3.87 - 5.11 MIL/uL   Hemoglobin 7.9 (L) 12.0 - 15.0 g/dL   HCT 22.5 (L) 36.0 - 46.0 %   MCV 82.4 78.0 - 100.0 fL   MCH 28.9 26.0 - 34.0 pg   MCHC 35.1 30.0 - 36.0 g/dL   RDW 21.1 (H) 11.5 - 15.5 %   Platelets 279 150 - 400 K/uL    Imaging Studies:   cxr negative lung fields "borderline cardiomegaly "on  Bedside PA cxr  Currently EPIC will not allow sonographic studies to automatically populate into notes.  In the meantime, copy  and paste results into note or free text.  Medications:  Scheduled . sodium chloride   Intravenous Once  . acetaminophen  650 mg Oral Once  . docusate sodium  100 mg Oral Daily  . docusate sodium  100 mg Oral BID  . folic acid  2 mg Oral Daily  . HYDROmorphone PCA 0.3 mg/mL   Intravenous 6 times per day  . methadone  5 mg Oral QHS  . prenatal multivitamin  1 tablet Oral Q1200  . sodium chloride  10-40 mL Intracatheter Q12H   I have reviewed the patient's current medications.  ASSESSMENT: Patient Active Problem List   Diagnosis Date Noted  . Supervision of high-risk pregnancy 06/11/2014    Priority: Low  . Sickle cell crisis 10/23/2014  . 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 Chronic pain 02/26/2013  . Anemia of chronic disease 10/05/2012  . 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 Mood swings  11/08/2011  . Migraines 11/08/2011  . Overweight(278.02) 05/24/2011  . GERD (gastroesophageal reflux disease) 02/17/2011  . Depression 01/06/2011  . Back pain 09/17/2010  . Sickle cell disease 01/08/2009  . 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  Raylene Carmickle V 10/25/2014,7:21 AM

## 2014-10-25 NOTE — Plan of Care (Signed)
Problem: Consults Goal: Birthing Suites Patient Information Press F2 to bring up selections list   Pt < [redacted] weeks EGA     

## 2014-10-25 NOTE — Progress Notes (Signed)
Pt requesting that RN "speed up the IV" after her pain medication. Instructed pt that increasing IVF is not appropriate with giving a narcotic.

## 2014-10-25 NOTE — Progress Notes (Signed)
Subjective: [name] who is [redacted] weeks pregnant with sickle cell disease admitted with acute sickle cell crisis. Patient's pain has dropped down to 8/10, no fever, no SOB, no NVDShe is on Dilaudid PCA low dose and has used 26.39 mg with 47 demands and 38 deliveries in the last 24 hours. Has also been on her Methadone. Patient cannot get NSAIDS due to pregnancy.    Objective: Vital signs in last 24 hours: Temp:  [98.5 F (36.9 C)-101.1 F (38.4 C)] 98.5 F (36.9 C) (06/11 0444) Pulse Rate:  [59-177] 118 (06/11 0640) Resp:  [11-42] 19 (06/11 0638) BP: (101-121)/(52-71) 101/57 mmHg (06/11 0444) SpO2:  [76 %-100 %] 98 % (06/11 0640) Weight change:     Intake/Output from previous day:   Intake/Output this shift:    General appearance: alert, cooperative and no distress Head: Normocephalic, without obvious abnormality, atraumatic Eyes: conjunctivae/corneas clear. PERRL, EOM's intact. Fundi benign. Neck: no adenopathy, no carotid bruit, no JVD, supple, symmetrical, trachea midline and thyroid not enlarged, symmetric, no tenderness/mass/nodules Back: symmetric, no curvature. ROM normal. No CVA tenderness. Resp: clear to auscultation bilaterally Chest wall: no tenderness Cardio: regular rate and rhythm, S1, S2 normal, no murmur, click, rub or gallop GI: soft, non-tender; bowel sounds normal; no masses,  no organomegaly Extremities: extremities normal, atraumatic, no cyanosis or edema Pulses: 2+ and symmetric Skin: Skin color, texture, turgor normal. No rashes or lesions  Lab Results:  Recent Labs  10/25/14 0115 10/25/14 0555  WBC 15.4* 18.8*  HGB 7.9* 7.9*  HCT 22.2* 22.5*  PLT 283 279   BMET  Recent Labs  10/23/14 1701  NA 134*  K 3.8  CL 110  CO2 20*  GLUCOSE 78  BUN <5*  CREATININE 0.40*  CALCIUM 8.6*    Studies/Results: Dg Chest 1 View  10/25/2014   CLINICAL DATA:  Acute onset of shortness of breath and tachycardia. Initial encounter.  EXAM: CHEST  1 VIEW   COMPARISON:  Chest radiograph performed 09/20/2014  FINDINGS: The patient's left PICC is noted ending about the distal SVC.  The lungs are well-aerated and clear. There is no evidence of focal opacification, pleural effusion or pneumothorax.  The cardiomediastinal silhouette is borderline enlarged. No acute osseous abnormalities are seen.  IMPRESSION: Borderline cardiomegaly; lungs remain grossly clear.   Electronically Signed   By: Garald Balding M.D.   On: 10/25/2014 00:31    Medications: I have reviewed the patient's current medications.  Assessment/Plan: A 24 yo admitted with sickle cell painful crisis.  #1. Sickle cell Painful crisis: Patient will be maintained on current regimen with reduction of her Dilaudid PCA bolus dose to 0.5mg . Will Dc the Dilaudid PCA tomorrow and start Oxycodone orally in anticipation of discharge by Monday.  #2. Sickle cell anemia: H/H stable. Continue to monitor. No evidence of acute hemolysis.  #3. 3rd trimester Pregnancy: Seems going well. Continue care per OBGYN.  #4. Leukocytosis: Slowly improved. due to [place]. Continue to monitor  LOS: 2 days   Dinna Severs,LAWAL 10/25/2014, 7:40 AM

## 2014-10-26 LAB — COMPREHENSIVE METABOLIC PANEL
ALBUMIN: 2.5 g/dL — AB (ref 3.5–5.0)
ALT: 19 U/L (ref 14–54)
AST: 31 U/L (ref 15–41)
Alkaline Phosphatase: 66 U/L (ref 38–126)
Anion gap: 4 — ABNORMAL LOW (ref 5–15)
BUN: 5 mg/dL — ABNORMAL LOW (ref 6–20)
CALCIUM: 8.2 mg/dL — AB (ref 8.9–10.3)
CO2: 20 mmol/L — AB (ref 22–32)
Chloride: 111 mmol/L (ref 101–111)
Creatinine, Ser: 0.3 mg/dL — ABNORMAL LOW (ref 0.44–1.00)
GFR calc non Af Amer: >60 mL/min (ref >60)
GLUCOSE: 89 mg/dL (ref 65–99)
POTASSIUM: 3.1 mmol/L — AB (ref 3.5–5.1)
SODIUM: 135 mmol/L (ref 135–145)
TOTAL PROTEIN: 5.3 g/dL — AB (ref 6.5–8.1)
Total Bilirubin: 5.8 mg/dL — ABNORMAL HIGH (ref 0.3–1.2)

## 2014-10-26 LAB — TYPE AND SCREEN
ABO/RH(D): B POS
Antibody Screen: NEGATIVE
UNIT DIVISION: 0

## 2014-10-26 LAB — CBC
HCT: 21.4 % — ABNORMAL LOW (ref 36.0–46.0)
Hemoglobin: 7.6 g/dL — ABNORMAL LOW (ref 12.0–15.0)
MCH: 29 pg (ref 26.0–34.0)
MCHC: 35.5 g/dL (ref 30.0–36.0)
MCV: 81.7 fL (ref 78.0–100.0)
Platelets: 241 10*3/uL (ref 150–400)
RBC: 2.62 MIL/uL — ABNORMAL LOW (ref 3.87–5.11)
RDW: 20 % — ABNORMAL HIGH (ref 11.5–15.5)
WBC: 18.9 10*3/uL — ABNORMAL HIGH (ref 4.0–10.5)

## 2014-10-26 LAB — LACTIC ACID, PLASMA
Lactic Acid, Venous: 0.7 mmol/L (ref 0.5–2.0)
Lactic Acid, Venous: 0.7 mmol/L (ref 0.5–2.0)

## 2014-10-26 MED ORDER — OXYCODONE HCL 5 MG PO TABS
15.0000 mg | ORAL_TABLET | ORAL | Status: DC
  Administered 2014-10-26 – 2014-10-28 (×8): 15 mg via ORAL
  Administered 2014-10-28: 10 mg via ORAL
  Administered 2014-10-28 (×2): 15 mg via ORAL
  Administered 2014-10-28: 10 mg via ORAL
  Administered 2014-10-28 – 2014-10-30 (×11): 15 mg via ORAL
  Filled 2014-10-26 (×25): qty 3

## 2014-10-26 MED ORDER — DIPHENHYDRAMINE HCL 50 MG/ML IJ SOLN
12.5000 mg | Freq: Four times a day (QID) | INTRAMUSCULAR | Status: DC | PRN
  Administered 2014-10-26 – 2014-10-29 (×8): 12.5 mg via INTRAVENOUS
  Filled 2014-10-26 (×7): qty 1

## 2014-10-26 MED ORDER — ONDANSETRON HCL 4 MG/2ML IJ SOLN
4.0000 mg | Freq: Four times a day (QID) | INTRAMUSCULAR | Status: DC | PRN
  Administered 2014-10-26 – 2014-10-27 (×2): 4 mg via INTRAVENOUS
  Filled 2014-10-26 (×2): qty 2

## 2014-10-26 MED ORDER — ENOXAPARIN SODIUM 40 MG/0.4ML ~~LOC~~ SOLN
40.0000 mg | SUBCUTANEOUS | Status: DC
Start: 2014-10-26 — End: 2014-10-30
  Administered 2014-10-26 – 2014-10-30 (×5): 40 mg via SUBCUTANEOUS
  Filled 2014-10-26 (×6): qty 0.4

## 2014-10-26 NOTE — Progress Notes (Signed)
MD notified of IV team phone call. No orders at this time.

## 2014-10-26 NOTE — Progress Notes (Signed)
Spoke with Pam RN regarding site assessment.  Recommendations given. Floor RN to d/w doctor.  Please consult if further assist needed today at [phone] or [phone]

## 2014-10-26 NOTE — Progress Notes (Signed)
Helene Kelp from the IV team called and talked to RN for a phone consult to review labs and possibility of continuing PICC line or pull the PICC. When describing the site to the IV RN and discussing that unable to obtain labs off the first or second port, Helene Kelp stating that we could pull the PICC and establish a peripheral IV site until 24-48 hours when a new PICC could be placed, or watch closely for additional signs of infected PICC. Continue to monitor the patient "picking" at the dressing, fever, changes with labs and VS. Dr. Kennon Rounds informed of conversation and no orders at this time.

## 2014-10-26 NOTE — Progress Notes (Signed)
Subjective: Patient still complaining of persistent pain rated as 7 out of 10 in her shoulders and back. She also had nausea with one episode of vomiting last night. She had difficulty sleeping last night also. She is still on the Dilaudid PCA but not Toradol and no NSAIDS. She has used 16 mg of the Dilaudid with 32 demands and 30 deliveries in the last 24 hours. There is no reported issues with her pregnancy. She is otherwise sleeping peacefully this morning.  Objective: Vital signs in last 24 hours: Temp:  [97.9 F (36.6 C)-103.1 F (39.5 C)] 98.4 F (36.9 C) (06/12 0000) Pulse Rate:  [35-224] 100 (06/12 0728) Resp:  [15-32] 20 (06/12 0654) BP: (90-140)/(37-106) 95/49 mmHg (06/12 0405) SpO2:  [65 %-100 %] 95 % (06/12 0728) Weight change:     Intake/Output from previous day: 06/11 0701 - 06/12 0700 In: 330 [Blood:330] Out: -  Intake/Output this shift:    General appearance: alert, cooperative and no distress Head: Normocephalic, without obvious abnormality, atraumatic Eyes: conjunctivae/corneas clear. PERRL, EOM's intact. Fundi benign. Neck: no adenopathy, no carotid bruit, no JVD, supple, symmetrical, trachea midline and thyroid not enlarged, symmetric, no tenderness/mass/nodules Back: symmetric, no curvature. ROM normal. No CVA tenderness. Resp: clear to auscultation bilaterally Chest wall: no tenderness Cardio: regular rate and rhythm, S1, S2 normal, no murmur, click, rub or gallop GI: soft, non-tender; bowel sounds normal; no masses,  no organomegaly Extremities: extremities normal, atraumatic, no cyanosis or edema Pulses: 2+ and symmetric Skin: Skin color, texture, turgor normal. No rashes or lesions  Lab Results:  Recent Labs  10/25/14 0115 10/25/14 0555  WBC 15.4* 18.8*  HGB 7.9* 7.9*  HCT 22.2* 22.5*  PLT 283 279   BMET  Recent Labs  10/23/14 1701  NA 134*  K 3.8  CL 110  CO2 20*  GLUCOSE 78  BUN <5*  CREATININE 0.40*  CALCIUM 8.6*     Studies/Results: Dg Chest 1 View  10/25/2014   CLINICAL DATA:  Acute onset of shortness of breath and tachycardia. Initial encounter.  EXAM: CHEST  1 VIEW  COMPARISON:  Chest radiograph performed 09/20/2014  FINDINGS: The patient's left PICC is noted ending about the distal SVC.  The lungs are well-aerated and clear. There is no evidence of focal opacification, pleural effusion or pneumothorax.  The cardiomediastinal silhouette is borderline enlarged. No acute osseous abnormalities are seen.  IMPRESSION: Borderline cardiomegaly; lungs remain grossly clear.   Electronically Signed   By: Garald Balding M.D.   On: 10/25/2014 00:31   Dg Chest 2 View  10/25/2014   CLINICAL DATA:  Fever with generalized body aches. Sickle cell crisis.  EXAM: CHEST  2 VIEW  COMPARISON:  Portable examinations 10/24/2014 and 09/20/2014.  FINDINGS: Left arm PICC appears unchanged in the lower SVC. The heart size and mediastinal contours are stable. The lungs appear unchanged with mild perihilar scarring or atelectasis. There is no edema, confluent airspace opacity or pleural effusion. Chronic osseous changes of sickle cell are again noted within the spine and humeral heads.  IMPRESSION: Stable chest.  No acute findings.   Electronically Signed   By: Richardean Sale M.D.   On: 10/25/2014 17:35    Medications: I have reviewed the patient's current medications.  Assessment/Plan: A 24 yo admitted with sickle cell painful crisis.  #1. Sickle cell Painful crisis:  Will DC the Dilaudid PCA this afternoon and start Oxycodone orally in anticipation of discharge by Monday. Continue with IV Dilaudid 2 mg every 3  hours as needed  #2. Sickle cell anemia: H/H stable. Continue to monitor. No evidence of acute hemolysis.  #3. 3rd trimester Pregnancy: Seems going well. Continue care per OBGYN.  #4. Leukocytosis: Slowly improved. due to [place]. Continue to monitor  LOS: 3 days   GARBA,LAWAL 10/26/2014, 8:35 AM

## 2014-10-26 NOTE — Progress Notes (Signed)
Discussion with pt that she is absolutely NOT to push buttons on the IV pump. If she continues to unlock IV pump and self administer boluses then we will need to leave her door open or possibly obtain a "sitter" for her. Pt stating that she usually just "silences" the pump, but multiple nurses have walked in to boluses infusing.  Pt states that it will not happen again.

## 2014-10-26 NOTE — Progress Notes (Signed)
MD notified and orders received to obtain a culture before re dressing. Attempted to obtain an adequate culture sample, unsure if an adequate sample obtained, but culture sent to lab.

## 2014-10-26 NOTE — Progress Notes (Signed)
Patient ID: [name], female   DOB: 1990-08-31, 24 y.o.   MRN: [id number] [name] is a 24 y.o. G2P0010 at [redacted]w[redacted]d who is admitted for sickle cell crisis. Fetal presentation is cephalic. Length of Stay:  3  Days  Subjective: Patient reports persistent pain now confined to her right shoulder and upper back. She reports one episode of difficulty breathing overnight which she was able to sleep off.  Patient reports the fetal movement as active. Patient reports uterine contraction  activity as none. Patient reports  vaginal bleeding as none. Patient describes fluid per vagina as None.  Vitals:  Blood pressure 95/49, pulse 115, temperature 98.4 F (36.9 C), temperature source Oral, resp. rate 20, height 5\' 2"  (1.575 m), weight 163 lb (73.936 kg), last menstrual period 03/20/2014, SpO2 99 %. Physical Examination:  General appearance - oriented to person, place, and time, overweight, acyanotic, in no respiratory distress, in mild to moderate distress, ill-appearing and chronically ill appearing Heart - normal rate and regular rhythm Abdomen - soft, nontender, nondistended Fundal Height:  size equals dates Cervical Exam: Not evaluated. An Extremities: extremities normal, atraumatic, no cyanosis or edema and Homans sign is negative, no sign of DVT with DTRs 2+ bilaterally Membranes:intact  Fetal Monitoring:  Baseline 125, mod variability, +accels, no decles Toco: no contractions  Labs:  Results for orders placed or performed during the hospital encounter of 10/23/14 (from the past 24 hour(s))  Type and screen for Sickle Cell Protocol   Collection Time: 10/25/14  9:05 AM  Result Value Ref Range   ABO/RH(D) B POS    Antibody Screen NEG    Sample Expiration 10/28/2014    Unit Number [id number]    Blood Component Type RBC LR PHER1    Unit division 00    Status of Unit ISSUED    Transfusion Status OK TO TRANSFUSE    Crossmatch Result Compatible     Imaging Studies:   cxr negative on 2 views  Medications:  Scheduled . docusate sodium  100 mg Oral Daily  . docusate sodium  100 mg Oral BID  . feeding supplement (ENSURE ENLIVE)  237 mL Oral BID BM  . folic acid  2 mg Oral Daily  . HYDROmorphone PCA 0.3 mg/mL   Intravenous 6 times per day  . methadone  5 mg Oral QHS  . prenatal multivitamin  1 tablet Oral Q1200  . sodium chloride  10-40 mL Intracatheter Q12H   I have reviewed the patient's current medications.  ASSESSMENT: Patient Active Problem List   Diagnosis Date Noted  . Supervision of high-risk pregnancy 06/11/2014    Priority: Medium  . Sickle cell disease 01/08/2009    Priority: Medium  . Sickle cell crisis 10/23/2014  . 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 Chronic pain 02/26/2013  . Anemia of chronic disease 10/05/2012  . 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 Mood swings 11/08/2011  . Migraines 11/08/2011  . Overweight(278.02) 05/24/2011  . GERD (gastroesophageal reflux disease) 02/17/2011  . Depression 01/06/2011  . Back pain 09/17/2010  . 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 Continue monitoring Continue close monitoring and current antepartum care.  [name] 10/26/2014,7:20 AM

## 2014-10-26 NOTE — Progress Notes (Signed)
Noted to pick at the dressing over the PICC, pt does not want RN to Change the dressing but requesting that the dressing be reinforce the current dressing. RN informing pt of the importance of maintaining a clean and intact dressing. Patient allowing RN to replace dressing.

## 2014-10-26 NOTE — Progress Notes (Signed)
MD notified of CBC results, WBC and Hgb from yesterday and then again today. No orders at this time.

## 2014-10-27 ENCOUNTER — Encounter (HOSPITAL_COMMUNITY): Payer: Self-pay | Admitting: Anesthesiology

## 2014-10-27 ENCOUNTER — Ambulatory Visit: Payer: Medicare Other | Admitting: Internal Medicine

## 2014-10-27 ENCOUNTER — Ambulatory Visit (HOSPITAL_COMMUNITY)
Admit: 2014-10-27 | Discharge: 2014-10-27 | Disposition: A | Discharge status: Home / Self Care | Payer: Medicare Other | Attending: Obstetrics and Gynecology | Admitting: Obstetrics and Gynecology

## 2014-10-27 DIAGNOSIS — T8579XA Infection and inflammatory reaction due to other internal prosthetic devices, implants and grafts, initial encounter: Secondary | ICD-10-CM

## 2014-10-27 DIAGNOSIS — T80219A Unspecified infection due to central venous catheter, initial encounter: Secondary | ICD-10-CM | POA: Diagnosis not present

## 2014-10-27 DIAGNOSIS — D72829 Elevated white blood cell count, unspecified: Secondary | ICD-10-CM

## 2014-10-27 DIAGNOSIS — O99013 Anemia complicating pregnancy, third trimester: Secondary | ICD-10-CM | POA: Insufficient documentation

## 2014-10-27 DIAGNOSIS — B9689 Other specified bacterial agents as the cause of diseases classified elsewhere: Secondary | ICD-10-CM

## 2014-10-27 DIAGNOSIS — O98813 Other maternal infectious and parasitic diseases complicating pregnancy, third trimester: Secondary | ICD-10-CM | POA: Insufficient documentation

## 2014-10-27 DIAGNOSIS — Z959 Presence of cardiac and vascular implant and graft, unspecified: Secondary | ICD-10-CM

## 2014-10-27 DIAGNOSIS — R5081 Fever presenting with conditions classified elsewhere: Secondary | ICD-10-CM

## 2014-10-27 DIAGNOSIS — D571 Sickle-cell disease without crisis: Secondary | ICD-10-CM | POA: Insufficient documentation

## 2014-10-27 DIAGNOSIS — Z3A32 32 weeks gestation of pregnancy: Secondary | ICD-10-CM

## 2014-10-27 DIAGNOSIS — Z452 Encounter for adjustment and management of vascular access device: Secondary | ICD-10-CM | POA: Diagnosis not present

## 2014-10-27 DIAGNOSIS — A419 Sepsis, unspecified organism: Secondary | ICD-10-CM | POA: Diagnosis not present

## 2014-10-27 LAB — URINALYSIS, ROUTINE W REFLEX MICROSCOPIC
Glucose, UA: NEGATIVE mg/dL
HGB URINE DIPSTICK: NEGATIVE
Ketones, ur: 15 mg/dL — AB
Leukocytes, UA: NEGATIVE
Nitrite: NEGATIVE
Protein, ur: NEGATIVE mg/dL
Specific Gravity, Urine: 1.01 (ref 1.005–1.030)
Urobilinogen, UA: 2 mg/dL — ABNORMAL HIGH (ref 0.0–1.0)
pH: 5.5 (ref 5.0–8.0)

## 2014-10-27 LAB — CBC
HCT: 20.6 % — ABNORMAL LOW (ref 36.0–46.0)
Hemoglobin: 7.3 g/dL — ABNORMAL LOW (ref 12.0–15.0)
MCH: 28.9 pg (ref 26.0–34.0)
MCHC: 35.4 g/dL (ref 30.0–36.0)
MCV: 81.4 fL (ref 78.0–100.0)
Platelets: 241 10*3/uL (ref 150–400)
RBC: 2.53 MIL/uL — ABNORMAL LOW (ref 3.87–5.11)
RDW: 20 % — ABNORMAL HIGH (ref 11.5–15.5)
WBC: 31.4 10*3/uL — ABNORMAL HIGH (ref 4.0–10.5)

## 2014-10-27 MED ORDER — HEPARIN SOD (PORK) LOCK FLUSH 100 UNIT/ML IV SOLN
INTRAVENOUS | Status: AC
  Filled 2014-10-27: qty 5

## 2014-10-27 MED ORDER — COMPLETENATE 29-1 MG PO CHEW
1.0000 | CHEWABLE_TABLET | Freq: Every day | ORAL | Status: DC
  Administered 2014-10-27 – 2014-10-30 (×4): 1 via ORAL
  Filled 2014-10-27 (×5): qty 1

## 2014-10-27 MED ORDER — PIPERACILLIN-TAZOBACTAM 3.375 G IVPB 30 MIN: 3.3750 g | Freq: Three times a day (TID) | INTRAVENOUS | Status: DC

## 2014-10-27 MED ORDER — LIDOCAINE HCL 1 % IJ SOLN
INTRAMUSCULAR | Status: DC
Start: 2014-10-27 — End: 2014-10-28
  Filled 2014-10-27: qty 20

## 2014-10-27 MED ORDER — ACETAMINOPHEN 325 MG PO TABS
650.0000 mg | ORAL_TABLET | Freq: Once | ORAL | Status: DC
  Filled 2014-10-27: qty 2

## 2014-10-27 MED ORDER — NALBUPHINE HCL 10 MG/ML IJ SOLN
10.0000 mg | Freq: Once | INTRAMUSCULAR | Status: AC
Start: 2014-10-27 — End: 2014-10-27
  Administered 2014-10-27: 10 mg via INTRAVENOUS
  Filled 2014-10-27 (×2): qty 1

## 2014-10-27 MED ORDER — PIPERACILLIN-TAZOBACTAM 3.375 G IVPB
3.3750 g | Freq: Three times a day (TID) | INTRAVENOUS | Status: DC
  Administered 2014-10-27 – 2014-10-29 (×6): 3.375 g via INTRAVENOUS
  Filled 2014-10-27 (×7): qty 50

## 2014-10-27 MED ORDER — VANCOMYCIN HCL 10 G IV SOLR
1250.0000 mg | Freq: Three times a day (TID) | INTRAVENOUS | Status: DC
  Administered 2014-10-27: 1250 mg via INTRAVENOUS
  Filled 2014-10-27 (×3): qty 1250

## 2014-10-27 NOTE — Progress Notes (Signed)
ANTIBIOTIC CONSULT NOTE-Preliminary  Pharmacy Consult for vancomycin Indication: line infection  Allergies  Allergen Reactions  . Carrot [Daucus Carota] Hives, Swelling and Rash    Dietary:  Please do not send any form of carrot to patient  . Carrot Oil Hives and Swelling  . Latex Rash    Patient Measurements: Height: 5\' 2"  (157.5 cm) Weight: 163 lb (73.936 kg) IBW/kg (Calculated) : 50.1   Vital Signs: Temp: 97 F (36.1 C) (06/13 0340) Temp Source: Axillary (06/13 0340) BP: 113/64 mmHg (06/13 0347) Pulse Rate: 116 (06/13 0500)  Labs:  Recent Labs  10/25/14 0555 10/26/14 1012 10/27/14 0610  WBC 18.8* 18.9* 31.4*  HGB 7.9* 7.6* 7.3*  PLT 279 241 PENDING  CREATININE  --  0.30*  --     Estimated Creatinine Clearance: 103.8 mL/min (by C-G formula based on Cr of 0.3).  No results for input(s): VANCOTROUGH, VANCOPEAK, VANCORANDOM, GENTTROUGH, GENTPEAK, GENTRANDOM, TOBRATROUGH, TOBRAPEAK, TOBRARND, AMIKACINPEAK, AMIKACINTROU, AMIKACIN in the last 72 hours.   Microbiology: Recent Results (from the past 720 hour(s))  Wound culture     Status: None (Preliminary result)   Collection Time: 10/26/14  9:15 AM  Result Value Ref Range Status   Specimen Description ARM PIC LINE  Final   Special Requests Normal  Final   Gram Stain PENDING  Incomplete   Culture   Final    Culture reincubated for better growth Performed at [facility]    Report Status PENDING  Incomplete    Medical History: Past Medical History  Diagnosis Date  . Miscarriage 03/22/2011    Pt reports 2 miscarriages.  . Depression 01/06/2011  . GERD (gastroesophageal reflux disease) 02/17/2011  . Trichotillomania     h/o  . Blood transfusion     "lots"  . Sickle cell anemia with crisis   . Exertional dyspnea     "sometimes"  . Migraines 11/08/11    "@ least twice/month"  . Chronic back pain     "very severe; have knot in my back; from tight muscle; take RX and exercise for it"  . Mood swings  11/08/11    "I go back and forth; real bad"  . Sickle cell anemia     Medications:  Scheduled:  . acetaminophen  650 mg Oral Once  . docusate sodium  100 mg Oral Daily  . docusate sodium  100 mg Oral BID  . enoxaparin (LOVENOX) injection  40 mg Subcutaneous Q24H  . feeding supplement (ENSURE ENLIVE)  237 mL Oral BID BM  . folic acid  2 mg Oral Daily  . methadone  5 mg Oral QHS  . oxyCODONE  15 mg Oral Q4H  . prenatal multivitamin  1 tablet Oral Q1200  . sodium chloride  10-40 mL Intracatheter Q12H  . vancomycin  1,250 mg Intravenous Q8H    Assessment: 24 yo female G2P0010 at [redacted]w[redacted]d admitted for sickle cell crisis. WBC increased 18.9>31.4. Line infection suspected. Pt was previously on vancomycin for fever, chronic PICC line in May. Levels were checked and dose increased to 1250mg  Q8 hours. Will start at this dose and check levels as indicated.   Goal of Therapy:  Vancomycin trough level 10-15 mcg/ml  Plan:  Start Vancomycin 1250mg  IV Q 8 hours Will check vancomycin trough at steady sate around the 5th dose or when clinically indicated.   Nyra Capes, [facility] 10/27/2014,7:29 AM

## 2014-10-27 NOTE — Progress Notes (Signed)
Spoke with Vikki Ports, Therapist, sports.   She informed me that Interventional Radiology will remove her PICC when they place a new one.

## 2014-10-27 NOTE — Progress Notes (Signed)
Spoke with Vikki Ports, RN about IV access for this patient.   Requesting we remove PICC and start a PIV.   We have not been successful with PIV access.   We can no longer get PICCs in her.   She goes to Interventional Radiology for her PICC insertions.   Suggested that they refer her to IR for a new PICC insertion.   Chelsea, RN informed me anesthesia attempted to get PIV access this morning without success.  She is notify the MD for further instruction.

## 2014-10-27 NOTE — Procedures (Signed)
Placement of right arm PICC.  Length = 36 cm.  Tip in lower SVC and ready to use.  No immediate complication.

## 2014-10-27 NOTE — Procedures (Deleted)
No note

## 2014-10-27 NOTE — Consult Note (Signed)
[facility] for Infectious Disease     Reason for Consult: probable line infection    Referring Physician: Dr. Elly Modena  Principal Problem:   Sickle cell crisis Active Problems:   Back pain   Line sepsis   . acetaminophen  650 mg Oral Once  . docusate sodium  100 mg Oral BID  . enoxaparin (LOVENOX) injection  40 mg Subcutaneous Q24H  . feeding supplement (ENSURE ENLIVE)  237 mL Oral BID BM  . folic acid  2 mg Oral Daily  . methadone  5 mg Oral QHS  . oxyCODONE  15 mg Oral Q4H  . piperacillin-tazobactam (ZOSYN)  IV  3.375 g Intravenous 3 times per day  . prenatal vitamin w/FE, FA  1 tablet Oral Q1200  . sodium chloride  10-40 mL Intracatheter Q12H  . vancomycin  1,250 mg Intravenous Q8H    Recommendations: Continue zosyn pending ID of organism I will stop vancomycin since it is GNR  Repeat blood cultures tomorrow  Assessment: She likely has a line infection with fever, high WBC and picc line in.  Unfortunately, she has very poor access and has to have a new picc line placed prior to clearance of blood cultures.    Antibiotics: Vancomycin and zosyn  HPI: [name] is a 24 y.o. female with sickle cell disease presented on 6/9 with pain unrelieved at home.  Initial Hgb of 9.3 and WBC up to 16.  Developed a fever and had blood cultures done 6/12 and now 1/2 positive for GNR.  WBC up to 18.  Lactic acid normal.  Patient currently at [facility] getting replacement of picc line and not available.     Review of Systems: unavailable  Past Medical History  Diagnosis Date  . Miscarriage 03/22/2011    Pt reports 2 miscarriages.  . Depression 01/06/2011  . GERD (gastroesophageal reflux disease) 02/17/2011  . Trichotillomania     h/o  . Blood transfusion     "lots"  . Sickle cell anemia with crisis   . Exertional dyspnea     "sometimes"  . Migraines 11/08/11    "@ least twice/month"  . Chronic back pain     "very severe; have knot in my back; from tight  muscle; take RX and exercise for it"  . Mood swings 11/08/11    "I go back and forth; real bad"  . Sickle cell anemia     History  Substance Use Topics  . Smoking status: Former Smoker -- 0.25 packs/day for 1 years    Types: Cigarettes    Quit date: 03/25/2013  . Smokeless tobacco: Never Used  . Alcohol Use: No     Comment: pt states she quit marijuan in May 2013. Rare ETOH, + cigarettes.  She is enrolled in school    Family History  Problem Relation Age of Onset  . Alcoholism    . Depression    . Hypercholesterolemia    . Hypertension    . Migraines    . Diabetes Maternal Grandmother   . Diabetes Paternal Grandmother   . Diabetes Maternal Grandfather   . Sickle cell trait Mother   . Sickle cell trait Father    Allergies  Allergen Reactions  . Carrot [Daucus Carota] Hives, Swelling and Rash    Dietary:  Please do not send any form of carrot to patient  . Carrot Oil Hives and Swelling  . Latex Rash    OBJECTIVE: Blood pressure 89/46, pulse 114, temperature  98.3 F (36.8 C), temperature source Oral, resp. rate 18, height 5\' 2"  (1.575 m), weight 163 lb (73.936 kg), last menstrual period 03/20/2014, SpO2 95 %.  Microbiology: Recent Results (from the past 240 hour(s))  Wound culture     Status: None (Preliminary result)   Collection Time: 10/26/14  9:15 AM  Result Value Ref Range Status   Specimen Description ARM PIC LINE  Final   Special Requests Normal  Final   Gram Stain PENDING  Incomplete   Culture   Final    Culture reincubated for better growth Performed at [facility]    Report Status PENDING  Incomplete  Culture, blood (routine x 2)     Status: None (Preliminary result)   Collection Time: 10/26/14 10:35 AM  Result Value Ref Range Status   Specimen Description BLOOD RIGHT ARM  Final   Special Requests BLOOD AEB  Final   Culture   Final    GRAM NEGATIVE RODS Note: Gram Stain Report Called to,Read Back By and Verified With: CHELSEA DEAN 10/27/14  0810 BY SMITHERSJ Performed at [facility]    Report Status PENDING  Incomplete  Culture, blood (routine x 2)     Status: None (Preliminary result)   Collection Time: 10/26/14 10:39 AM  Result Value Ref Range Status   Specimen Description BLOOD LEFT HAND  Final   Special Requests BLOOD BOTTLES DRAWN AEROBIC ONLY  Final   Culture   Final           BLOOD CULTURE RECEIVED NO GROWTH TO DATE CULTURE WILL BE HELD FOR 5 DAYS BEFORE ISSUING A FINAL NEGATIVE REPORT Performed at [facility]    Report Status PENDING  Incomplete    Ranata Laughery, Herbie [place], [place] [facility] for Infectious Disease [facility] [email or web address] [phone] pager  [phone] cell 10/27/2014, 2:08 PM

## 2014-10-27 NOTE — Progress Notes (Signed)
Patient ID: [name], female   DOB: 1991-03-26, 24 y.o.   MRN: [id number] [place]) NOTE  [name] is a 24 y.o. G2P0010 at [redacted]w[redacted]d by best clinical estimate who is admitted for sickle cell pain crisis.   Fetal presentation is unsure. Length of Stay:  4  Days  Subjective: Reports significant worsening back pain since stopping PCA last night.  Reports diarrhea, nausea and vomiting as well. Patient reports the fetal movement as active. Patient reports uterine contraction  activity as none. Patient reports  vaginal bleeding as none. Patient describes fluid per vagina as None.  Vitals:  Blood pressure 113/64, pulse 116, temperature 97 F (36.1 C), temperature source Axillary, resp. rate 18, height 5\' 2"  (1.575 m), weight 163 lb (73.936 kg), last menstrual period 03/20/2014, SpO2 94 %. Physical Examination:  General appearance - alert, well appearing, and in no distress Abdomen - gravid, NT Fundal Height:  size equals dates Extremities: extremities normal, atraumatic, no cyanosis or edema and tender in shoulders  Membranes:intact  Fetal Monitoring:  Baseline: 150 bpm, Variability: Good {> 6 bpm), Accelerations: Reactive and Decelerations: Absent  Labs:  Results for orders placed or performed during the hospital encounter of 10/23/14 (from the past 24 hour(s))  Wound culture   Collection Time: 10/26/14  9:15 AM  Result Value Ref Range   Specimen Description ARM PIC LINE    Special Requests Normal    Gram Stain PENDING    Culture      Culture reincubated for better growth Performed at [facility]    Report Status PENDING   CBC   Collection Time: 10/26/14 10:12 AM  Result Value Ref Range   WBC 18.9 (H) 4.0 - 10.5 K/uL   RBC 2.62 (L) 3.87 - 5.11 MIL/uL   Hemoglobin 7.6 (L) 12.0 - 15.0 g/dL   HCT 21.4 (L) 36.0 - 46.0 %   MCV 81.7 78.0 - 100.0 fL   MCH 29.0 26.0 - 34.0 pg   MCHC 35.5 30.0 - 36.0 g/dL   RDW 20.0 (H) 11.5 -  15.5 %   Platelets 241 150 - 400 K/uL  Comprehensive metabolic panel   Collection Time: 10/26/14 10:12 AM  Result Value Ref Range   Sodium 135 135 - 145 mmol/L   Potassium 3.1 (L) 3.5 - 5.1 mmol/L   Chloride 111 101 - 111 mmol/L   CO2 20 (L) 22 - 32 mmol/L   Glucose, Bld 89 65 - 99 mg/dL   BUN 5 (L) 6 - 20 mg/dL   Creatinine, Ser 0.30 (L) 0.44 - 1.00 mg/dL   Calcium 8.2 (L) 8.9 - 10.3 mg/dL   Total Protein 5.3 (L) 6.5 - 8.1 g/dL   Albumin 2.5 (L) 3.5 - 5.0 g/dL   AST 31 15 - 41 U/L   ALT 19 14 - 54 U/L   Alkaline Phosphatase 66 38 - 126 U/L   Total Bilirubin 5.8 (H) 0.3 - 1.2 mg/dL   GFR calc non Af Amer >60 >60 mL/min   GFR calc Af Amer >60 >60 mL/min   Anion gap 4 (L) 5 - 15  Lactic acid, plasma   Collection Time: 10/26/14 10:35 AM  Result Value Ref Range   Lactic Acid, Venous 0.7 0.5 - 2.0 mmol/L  Lactic acid, plasma   Collection Time: 10/26/14  2:18 PM  Result Value Ref Range   Lactic Acid, Venous 0.7 0.5 - 2.0 mmol/L  CBC   Collection Time: 10/27/14  6:10 AM  Result Value Ref Range   WBC 31.4 (H) 4.0 - 10.5 K/uL   RBC 2.53 (L) 3.87 - 5.11 MIL/uL   Hemoglobin 7.3 (L) 12.0 - 15.0 g/dL   HCT 20.6 (L) 36.0 - 46.0 %   MCV 81.4 78.0 - 100.0 fL   MCH 28.9 26.0 - 34.0 pg   MCHC 35.4 30.0 - 36.0 g/dL   RDW 20.0 (H) 11.5 - 15.5 %   Platelets PENDING 150 - 400 K/uL    Medications:  Scheduled . acetaminophen  650 mg Oral Once  . docusate sodium  100 mg Oral Daily  . docusate sodium  100 mg Oral BID  . enoxaparin (LOVENOX) injection  40 mg Subcutaneous Q24H  . feeding supplement (ENSURE ENLIVE)  237 mL Oral BID BM  . folic acid  2 mg Oral Daily  . methadone  5 mg Oral QHS  . oxyCODONE  15 mg Oral Q4H  . prenatal multivitamin  1 tablet Oral Q1200  . sodium chloride  10-40 mL Intracatheter Q12H  . vancomycin  1,250 mg Intravenous Q8H   I have reviewed the patient's current medications.  ASSESSMENT: Principal Problem:   Sickle cell crisis Active Problems:   Back  pain   Line sepsis   PLAN: Now with recurrent fever and probable line sepsis given increasing WBC and fever. Had purulent drainage from PICC site yesterday. Begin Vancomycin Blood cultures pending Neg CXR yesterday U/A pending SS medicine to address pain control with patient.  Donnamae Jude, MD 10/27/2014,7:40 AM

## 2014-10-27 NOTE — Progress Notes (Signed)
Subjective: Patient has developed sepsis secondary to line infection. She has been off the Dilaudid PCA yesterday in anticipation for discharge today. ID has been consulted and patient is currently on Zosyn with her cultures showing gram-negative rods. Objective: Vital signs in last 24 hours: Temp:  [97 F (36.1 C)-102.8 F (39.3 C)] 98.5 F (36.9 C) (06/13 1615) Pulse Rate:  [82-126] 95 (06/13 1629) Resp:  [18-20] 18 (06/13 1615) BP: (89-113)/(39-64) 98/55 mmHg (06/13 1615) SpO2:  [91 %-99 %] 98 % (06/13 1629) Weight change:     Intake/Output from previous day: 06/12 0701 - 06/13 0700 In: 10 [I.V.:10] Out: -  Intake/Output this shift:    General appearance: alert, cooperative and no distress Head: Normocephalic, without obvious abnormality, atraumatic Eyes: conjunctivae/corneas clear. PERRL, EOM's intact. Fundi benign. Neck: no adenopathy, no carotid bruit, no JVD, supple, symmetrical, trachea midline and thyroid not enlarged, symmetric, no tenderness/mass/nodules Back: symmetric, no curvature. ROM normal. No CVA tenderness. Resp: clear to auscultation bilaterally Chest wall: no tenderness Cardio: regular rate and rhythm, S1, S2 normal, no murmur, click, rub or gallop GI: soft, non-tender; bowel sounds normal; no masses,  no organomegaly Extremities: extremities normal, atraumatic, no cyanosis or edema Pulses: 2+ and symmetric Skin: Skin color, texture, turgor normal. No rashes or lesions  Lab Results:  Recent Labs  10/26/14 1012 10/27/14 0610  WBC 18.9* 31.4*  HGB 7.6* 7.3*  HCT 21.4* 20.6*  PLT 241 241   BMET  Recent Labs  10/26/14 1012  NA 135  K 3.1*  CL 111  CO2 20*  GLUCOSE 89  BUN 5*  CREATININE 0.30*  CALCIUM 8.2*    Studies/Results: Dg Chest 2 View  10/25/2014   CLINICAL DATA:  Fever with generalized body aches. Sickle cell crisis.  EXAM: CHEST  2 VIEW  COMPARISON:  Portable examinations 10/24/2014 and 09/20/2014.  FINDINGS: Left arm PICC  appears unchanged in the lower SVC. The heart size and mediastinal contours are stable. The lungs appear unchanged with mild perihilar scarring or atelectasis. There is no edema, confluent airspace opacity or pleural effusion. Chronic osseous changes of sickle cell are again noted within the spine and humeral heads.  IMPRESSION: Stable chest.  No acute findings.   Electronically Signed   By: Richardean Sale M.D.   On: 10/25/2014 17:35    Medications: I have reviewed the patient's current medications.  Assessment/Plan: A 24 yo admitted with sickle cell painful crisis.  #1. PICC line infection: Culture and sensitivities pending. It appears gram-negative rods growing. Continue per infectious disease.  #2. Sickle cell Painful crisis:  Continue with IV Dilaudid 2 mg every 3 hours as needed as well as oral medications.  #3. Sickle cell anemia: H/H stable. Continue to monitor. No evidence of acute hemolysis.  #4. 3rd trimester Pregnancy: Seems going well. Continue care per OBGYN.  #5. Leukocytosis: Slowly improved. due to [place]. Continue to monitor  LOS: 4 days   Burnis Halling,LAWAL 10/27/2014, 5:00 PM

## 2014-10-28 DIAGNOSIS — D5700 Hb-SS disease with crisis, unspecified: Secondary | ICD-10-CM | POA: Insufficient documentation

## 2014-10-28 LAB — COMPREHENSIVE METABOLIC PANEL
ALBUMIN: 2.4 g/dL — AB (ref 3.5–5.0)
ALT: 12 U/L — ABNORMAL LOW (ref 14–54)
AST: 19 U/L (ref 15–41)
Alkaline Phosphatase: 71 U/L (ref 38–126)
Anion gap: 4 — ABNORMAL LOW (ref 5–15)
BUN: <5 mg/dL — ABNORMAL LOW (ref 6–20)
CO2: 24 mmol/L (ref 22–32)
CREATININE: 0.45 mg/dL (ref 0.44–1.00)
Calcium: 8.1 mg/dL — ABNORMAL LOW (ref 8.9–10.3)
Chloride: 110 mmol/L (ref 101–111)
GFR calc Af Amer: >60 mL/min (ref >60)
GFR calc non Af Amer: >60 mL/min (ref >60)
Glucose, Bld: 81 mg/dL (ref 65–99)
POTASSIUM: 3 mmol/L — AB (ref 3.5–5.1)
Sodium: 138 mmol/L (ref 135–145)
Total Bilirubin: 4.3 mg/dL — ABNORMAL HIGH (ref 0.3–1.2)
Total Protein: 5.2 g/dL — ABNORMAL LOW (ref 6.5–8.1)

## 2014-10-28 LAB — CBC WITH DIFFERENTIAL/PLATELET
Basophils Absolute: 0 10*3/uL (ref 0.0–0.1)
Basophils Relative: 0 % (ref 0–1)
Eosinophils Absolute: 0.2 10*3/uL (ref 0.0–0.7)
Eosinophils Relative: 1 % (ref 0–5)
HCT: 19 % — ABNORMAL LOW (ref 36.0–46.0)
Hemoglobin: 6.6 g/dL — CL (ref 12.0–15.0)
LYMPHS ABS: 2.5 10*3/uL (ref 0.7–4.0)
Lymphocytes Relative: 12 % (ref 12–46)
MCH: 28.3 pg (ref 26.0–34.0)
MCHC: 34.7 g/dL (ref 30.0–36.0)
MCV: 81.5 fL (ref 78.0–100.0)
Monocytes Absolute: 1.8 10*3/uL — ABNORMAL HIGH (ref 0.1–1.0)
Monocytes Relative: 9 % (ref 3–12)
Neutro Abs: 16 10*3/uL — ABNORMAL HIGH (ref 1.7–7.7)
Neutrophils Relative %: 78 % — ABNORMAL HIGH (ref 43–77)
Platelets: 278 10*3/uL (ref 150–400)
RBC: 2.33 MIL/uL — AB (ref 3.87–5.11)
RDW: 20.6 % — ABNORMAL HIGH (ref 11.5–15.5)
WBC: 20.5 10*3/uL — AB (ref 4.0–10.5)

## 2014-10-28 LAB — RETICULOCYTES
RBC.: 2.33 MIL/uL — ABNORMAL LOW (ref 3.87–5.11)
Retic Count, Absolute: 270.3 10*3/uL — ABNORMAL HIGH (ref 19.0–186.0)
Retic Ct Pct: 11.6 % — ABNORMAL HIGH (ref 0.4–3.1)

## 2014-10-28 LAB — PREPARE RBC (CROSSMATCH)

## 2014-10-28 MED ORDER — CYCLOBENZAPRINE HCL 10 MG PO TABS
10.0000 mg | ORAL_TABLET | Freq: Three times a day (TID) | ORAL | Status: DC | PRN
  Administered 2014-10-28: 10 mg via ORAL
  Filled 2014-10-28: qty 1

## 2014-10-28 MED ORDER — ACETAMINOPHEN 325 MG PO TABS
650.0000 mg | ORAL_TABLET | Freq: Once | ORAL | Status: AC
  Administered 2014-10-28: 650 mg via ORAL
  Filled 2014-10-28: qty 2

## 2014-10-28 MED ORDER — SODIUM CHLORIDE 0.9 % IV SOLN: Freq: Once | INTRAVENOUS | Status: AC

## 2014-10-28 MED ORDER — PANTOPRAZOLE SODIUM 40 MG IV SOLR
40.0000 mg | INTRAVENOUS | Status: DC
  Administered 2014-10-28 – 2014-10-29 (×2): 40 mg via INTRAVENOUS
  Filled 2014-10-28 (×4): qty 40

## 2014-10-28 MED ORDER — DIPHENHYDRAMINE HCL 25 MG PO CAPS: 25.0000 mg | ORAL_CAPSULE | Freq: Once | ORAL | Status: DC

## 2014-10-28 NOTE — Progress Notes (Signed)
SICKLE CELL SERVICE PROGRESS NOTE  [name] GEX:[id number] DOB: 06-05-1990 DOA: 10/23/2014 PCP: MATTHEWS,MICHELLE A., MD  Assessment/Plan: Principal Problem:   Sickle cell crisis Active Problems:   Back pain   Line sepsis  1. Hb SS with crisis: Pt c/o pain in intrascapular region. She is currently on oral analgesics with clinician assisted doses as needed. Will continue this regimen. She will likely benefit from [facility] and a few doses of Toradol if okay with Ob-Gyn.  No changes to her pain regimen at this time 2. Sickle Cell Disease: Pt was due for partial exchange transfusion as an outpatient but rescheduled her type an screen and ended up in crisis which was likely avoidable had she received the partial exchange transfusion in a timely manner.  Labs are pending at this time. Will review and discuss with Ob about need to complete partial exchange transfusion while hospitalized. 3. GNR bacteremia: Pt on Zosyn day #2. ID following. Awaiting ID and sensitivities.   Code Status: Full Code Family Communication: N/A Disposition Plan: Not yet ready for discharge  [place].  Pager [phone]. If 7PM-7AM, please contact night-coverage.  10/28/2014, 9:16 AM  LOS: 5 days    Consultants:  Dr. Emmit Alexanders  Procedures:  PICC line removal and placement  Antibiotics:  Vancomycin 6/13 >>6/13  Zosyn 6/13 >>  HPI/Subjective: Pt states that she has pain in the intrascapular region and right shoulder.   Objective: Filed Vitals:   10/28/14 0728 10/28/14 0729 10/28/14 0730 10/28/14 0735  BP:      Pulse: 78 78 89 84  Temp:      TempSrc:      Resp:      Height:      Weight:      SpO2: 93% 96% 100% 99%   Weight change:  No intake or output data in the 24 hours ending 10/28/14 0916  General: Alert, awake, oriented x3, in no acute distress.  HEENT: [place]/AT PEERL, EOMI, anicteric OROPHARYNX:  Moist, No exudate/ erythema/lesions.  Heart: Regular rate and rhythm, without  murmurs, rubs, gallops Lungs: Clear to auscultation, no wheezing or rhonchi noted.  Abdomen: Gravid. Non-tender. Neuro: No focal neurological deficits noted cranial nerves II through XII grossly intact. Strength at baseline in bilateral upper and lower extremities. Musculoskeletal: No warm swelling or erythema around joints, no spinal tenderness noted. Psychiatric: Patient alert and oriented x3, good insight and cognition, good recent to remote recall.   Data Reviewed: Basic Metabolic Panel:  Recent Labs Lab 10/23/14 1701 10/26/14 1012  NA 134* 135  K 3.8 3.1*  CL 110 111  CO2 20* 20*  GLUCOSE 78 89  BUN <5* 5*  CREATININE 0.40* 0.30*  CALCIUM 8.6* 8.2*   Liver Function Tests:  Recent Labs Lab 10/26/14 1012  AST 31  ALT 19  ALKPHOS 66  BILITOT 5.8*  PROT 5.3*  ALBUMIN 2.5*   No results for input(s): LIPASE, AMYLASE in the last 168 hours. No results for input(s): AMMONIA in the last 168 hours. CBC:  Recent Labs Lab 10/24/14 0540 10/25/14 0115 10/25/14 0555 10/26/14 1012 10/27/14 0610  WBC 17.1* 15.4* 18.8* 18.9* 31.4*  HGB 8.7* 7.9* 7.9* 7.6* 7.3*  HCT 24.8* 22.2* 22.5* 21.4* 20.6*  MCV 82.9 82.5 82.4 81.7 81.4  PLT 319 283 279 241 241   Cardiac Enzymes: No results for input(s): CKTOTAL, CKMB, CKMBINDEX, TROPONINI in the last 168 hours. BNP (last 3 results) No results for input(s): BNP in the last 8760 hours.  ProBNP (last  3 results) No results for input(s): PROBNP in the last 8760 hours.  CBG: No results for input(s): GLUCAP in the last 168 hours.  Recent Results (from the past 240 hour(s))  Wound culture     Status: None (Preliminary result)   Collection Time: 10/26/14  9:15 AM  Result Value Ref Range Status   Specimen Description ARM PIC LINE  Final   Special Requests Normal  Final   Gram Stain   Final    NO WBC SEEN NO SQUAMOUS EPITHELIAL CELLS SEEN NO ORGANISMS SEEN Performed at [facility]    Culture   Final    MODERATE GRAM  NEGATIVE RODS Performed at [facility]    Report Status PENDING  Incomplete  Culture, blood (routine x 2)     Status: None (Preliminary result)   Collection Time: 10/26/14 10:35 AM  Result Value Ref Range Status   Specimen Description BLOOD RIGHT ARM  Final   Special Requests BLOOD AEB  Final   Culture   Final    GRAM NEGATIVE RODS Note: Gram Stain Report Called to,Read Back By and Verified With: CHELSEA DEAN 10/27/14 0810 BY SMITHERSJ Performed at [facility]    Report Status PENDING  Incomplete  Culture, blood (routine x 2)     Status: None (Preliminary result)   Collection Time: 10/26/14 10:39 AM  Result Value Ref Range Status   Specimen Description BLOOD LEFT HAND  Final   Special Requests BLOOD BOTTLES DRAWN AEROBIC ONLY  Final   Culture   Final           BLOOD CULTURE RECEIVED NO GROWTH TO DATE CULTURE WILL BE HELD FOR 5 DAYS BEFORE ISSUING A FINAL NEGATIVE REPORT Performed at [facility]    Report Status PENDING  Incomplete  Culture, blood (single)     Status: None (Preliminary result)   Collection Time: 10/27/14  2:10 AM  Result Value Ref Range Status   Specimen Description BLOOD RIGHT ARM  Final   Special Requests BOTTLES DRAWN AEROBIC AND ANAEROBIC 20ML  Final   Culture   Final    GRAM NEGATIVE RODS Note: Gram Stain Report Called to,Read Back By and Verified With: Carole Binning 147AM ON [id number] BJENN Performed at [facility]    Report Status PENDING  Incomplete     Studies: Dg Chest 1 View  10/25/2014   CLINICAL DATA:  Acute onset of shortness of breath and tachycardia. Initial encounter.  EXAM: CHEST  1 VIEW  COMPARISON:  Chest radiograph performed 09/20/2014  FINDINGS: The patient's left PICC is noted ending about the distal SVC.  The lungs are well-aerated and clear. There is no evidence of focal opacification, pleural effusion or pneumothorax.  The cardiomediastinal silhouette is borderline enlarged. No acute osseous  abnormalities are seen.  IMPRESSION: Borderline cardiomegaly; lungs remain grossly clear.   Electronically Signed   By: Garald Balding M.D.   On: 10/25/2014 00:31   Dg Chest 2 View  10/25/2014   CLINICAL DATA:  Fever with generalized body aches. Sickle cell crisis.  EXAM: CHEST  2 VIEW  COMPARISON:  Portable examinations 10/24/2014 and 09/20/2014.  FINDINGS: Left arm PICC appears unchanged in the lower SVC. The heart size and mediastinal contours are stable. The lungs appear unchanged with mild perihilar scarring or atelectasis. There is no edema, confluent airspace opacity or pleural effusion. Chronic osseous changes of sickle cell are again noted within the spine and humeral heads.  IMPRESSION: Stable chest.  No acute findings.   Electronically Signed   By: Richardean Sale M.D.   On: 10/25/2014 17:35   US Ob Follow Up  10/17/2014   OBSTETRICAL ULTRASOUND: This exam was performed within a [facility]. The OB US report was generated in the AS system, and faxed to the ordering physician.   This report is available in the [facility]. See the [facility] Obstetric US report via the Image Link.  Ir Fluoro Guide Cv Line Right  10/27/2014   CLINICAL DATA:  24 year old with sickle cell anemia, third trimester pregnancy and concern for a left arm PICC line infection. Request for new PICC line placement.  EXAM: FLUOROSCOPIC AND ULTRASOUND GUIDED PLACEMENT OF RIGHT ARM PICC LINE  FLUOROSCOPY TIME:  6 seconds, 2.3 mGy  TECHNIQUE: The procedure was explained to the patient. The risks and benefits of the procedure were discussed and the patient's questions were addressed. Specifically, the risks of radiation were pregnancy were discussed with the patient. Informed consent was obtained from the patient. The patient's abdomen and pelvis was shielded with lead.  The right arm was prepped with chlorhexidine, draped in the usual sterile fashion using maximum barrier technique (cap and mask, sterile gown, sterile  gloves, large sterile sheet, hand hygiene and cutaneous antiseptic). Local anesthesia was attained by infiltration with 1% lidocaine.  Ultrasound demonstrated multiple right upper extremity veins. Prominent vein which probably represents a brachial vein was targeted. Under real-time ultrasound guidance, this vein was accessed with a 21 gauge micropuncture needle and image documentation was performed. The needle was exchanged over a guidewire for a peel-away sheath through which a 36 cm 5 [place] dual lumen power injectable PICC was advanced, and positioned with its tip at the lower SVC/right atrial junction. Fluoroscopy during the procedure and fluoro spot radiograph confirms appropriate catheter position. The catheter was flushed, secured to the skin, and covered with a sterile dressing. Left arm PICC line was removed at the end of the procedure.  FINDINGS: Catheter tip near the superior cavoatrial junction.  COMPLICATIONS: None.  The patient tolerated the procedure well.  IMPRESSION: Successful placement of a right arm PICC with sonographic and fluoroscopic guidance. The catheter is ready for use.   Electronically Signed   By: Markus Daft M.D.   On: 10/27/2014 17:42   Ir US Guide Vasc Access Right  10/27/2014   CLINICAL DATA:  24 year old with sickle cell anemia, third trimester pregnancy and concern for a left arm PICC line infection. Request for new PICC line placement.  EXAM: FLUOROSCOPIC AND ULTRASOUND GUIDED PLACEMENT OF RIGHT ARM PICC LINE  FLUOROSCOPY TIME:  6 seconds, 2.3 mGy  TECHNIQUE: The procedure was explained to the patient. The risks and benefits of the procedure were discussed and the patient's questions were addressed. Specifically, the risks of radiation were pregnancy were discussed with the patient. Informed consent was obtained from the patient. The patient's abdomen and pelvis was shielded with lead.  The right arm was prepped with chlorhexidine, draped in the usual sterile fashion using  maximum barrier technique (cap and mask, sterile gown, sterile gloves, large sterile sheet, hand hygiene and cutaneous antiseptic). Local anesthesia was attained by infiltration with 1% lidocaine.  Ultrasound demonstrated multiple right upper extremity veins. Prominent vein which probably represents a brachial vein was targeted. Under real-time ultrasound guidance, this vein was accessed with a 21 gauge micropuncture needle and image documentation was performed. The needle was exchanged over a guidewire for a peel-away sheath through which a 36  cm 5 [place] dual lumen power injectable PICC was advanced, and positioned with its tip at the lower SVC/right atrial junction. Fluoroscopy during the procedure and fluoro spot radiograph confirms appropriate catheter position. The catheter was flushed, secured to the skin, and covered with a sterile dressing. Left arm PICC line was removed at the end of the procedure.  FINDINGS: Catheter tip near the superior cavoatrial junction.  COMPLICATIONS: None.  The patient tolerated the procedure well.  IMPRESSION: Successful placement of a right arm PICC with sonographic and fluoroscopic guidance. The catheter is ready for use.   Electronically Signed   By: Markus Daft M.D.   On: 10/27/2014 17:42    Scheduled Meds: . acetaminophen  650 mg Oral Once  . docusate sodium  100 mg Oral BID  . enoxaparin (LOVENOX) injection  40 mg Subcutaneous Q24H  . feeding supplement (ENSURE ENLIVE)  237 mL Oral BID BM  . folic acid  2 mg Oral Daily  . methadone  5 mg Oral QHS  . oxyCODONE  15 mg Oral Q4H  . piperacillin-tazobactam (ZOSYN)  IV  3.375 g Intravenous 3 times per day  . prenatal vitamin w/FE, FA  1 tablet Oral Q1200  . sodium chloride  10-40 mL Intracatheter Q12H   Continuous Infusions: . sodium chloride 125 mL/hr at 10/27/14 1610    Time spent 35 minutes.

## 2014-10-28 NOTE — Progress Notes (Signed)
Patient ID: [name], female   DOB: 10-22-1990, 24 y.o.   MRN: [id number] [place]) NOTE  [name] is a 24 y.o. G2P0010 at [redacted]w[redacted]d by best clinical estimate who is admitted for sickle cell pain crisis.   Fetal presentation is unsure. Length of Stay:  5  Days  Subjective: Reports persistent shoulder pain not well controlled with current pain regimen. She states that her pain makes it difficult to sleep Patient reports the fetal movement as active. Patient reports uterine contraction  activity as none. Patient reports  vaginal bleeding as none. Patient describes fluid per vagina as None.  Vitals:  Blood pressure 98/34, pulse 87, temperature 98.6 F (37 C), temperature source Oral, resp. rate 18, height 5\' 2"  (1.575 m), weight 163 lb (73.936 kg), last menstrual period 03/20/2014, SpO2 95 %. Physical Examination:  General appearance - alert, well appearing, and in no distress Abdomen - gravid, NT Fundal Height:  size equals dates Extremities: extremities normal, atraumatic, no cyanosis or edema and tender in shoulders  Membranes:intact  Fetal Monitoring:  Baseline: 135 bpm, Variability: Good {> 6 bpm), Accelerations: Reactive and Decelerations: Absent Toco: no contractions  Labs:  Results for orders placed or performed during the hospital encounter of 10/23/14 (from the past 24 hour(s))  Urinalysis, Routine w reflex microscopic (not at [facility])   Collection Time: 10/27/14  1:20 PM  Result Value Ref Range   Color, Urine YELLOW YELLOW   APPearance CLEAR CLEAR   Specific Gravity, Urine 1.010 1.005 - 1.030   pH 5.5 5.0 - 8.0   Glucose, UA NEGATIVE NEGATIVE mg/dL   Hgb urine dipstick NEGATIVE NEGATIVE   Bilirubin Urine SMALL (A) NEGATIVE   Ketones, ur 15 (A) NEGATIVE mg/dL   Protein, ur NEGATIVE NEGATIVE mg/dL   Urobilinogen, UA 2.0 (H) 0.0 - 1.0 mg/dL   Nitrite NEGATIVE NEGATIVE   Leukocytes, UA NEGATIVE NEGATIVE    Medications:   Scheduled . acetaminophen  650 mg Oral Once  . docusate sodium  100 mg Oral BID  . enoxaparin (LOVENOX) injection  40 mg Subcutaneous Q24H  . feeding supplement (ENSURE ENLIVE)  237 mL Oral BID BM  . folic acid  2 mg Oral Daily  . methadone  5 mg Oral QHS  . oxyCODONE  15 mg Oral Q4H  . piperacillin-tazobactam (ZOSYN)  IV  3.375 g Intravenous 3 times per day  . prenatal vitamin w/FE, FA  1 tablet Oral Q1200  . sodium chloride  10-40 mL Intracatheter Q12H   I have reviewed the patient's current medications.  ASSESSMENT: Principal Problem:   Sickle cell crisis Active Problems:   Back pain   Line sepsis   PLAN: Patient with bacteremia (GNR)- Continue Zosyn PICC line was removed and replaced 10/27/2014 in the afternoon Follow up repeat blood cultures as per ID Continue pain management as per sickle cell team Continue current antepartum care   Nancy Curet, MD 10/28/2014,7:11 AM

## 2014-10-29 DIAGNOSIS — Y838 Other surgical procedures as the cause of abnormal reaction of the patient, or of later complication, without mention of misadventure at the time of the procedure: Secondary | ICD-10-CM

## 2014-10-29 DIAGNOSIS — E876 Hypokalemia: Secondary | ICD-10-CM | POA: Clinically undetermined

## 2014-10-29 DIAGNOSIS — R509 Fever, unspecified: Secondary | ICD-10-CM

## 2014-10-29 DIAGNOSIS — R7881 Bacteremia: Secondary | ICD-10-CM | POA: Insufficient documentation

## 2014-10-29 DIAGNOSIS — T827XXA Infection and inflammatory reaction due to other cardiac and vascular devices, implants and grafts, initial encounter: Secondary | ICD-10-CM | POA: Insufficient documentation

## 2014-10-29 DIAGNOSIS — T80211A Bloodstream infection due to central venous catheter, initial encounter: Secondary | ICD-10-CM

## 2014-10-29 DIAGNOSIS — B961 Klebsiella pneumoniae [K. pneumoniae] as the cause of diseases classified elsewhere: Secondary | ICD-10-CM

## 2014-10-29 DIAGNOSIS — T827XXD Infection and inflammatory reaction due to other cardiac and vascular devices, implants and grafts, subsequent encounter: Secondary | ICD-10-CM

## 2014-10-29 LAB — DIFFERENTIAL
BASOS ABS: 0 10*3/uL (ref 0.0–0.1)
BLASTS: 0 %
Band Neutrophils: 0 % (ref 0–10)
Basophils Relative: 0 % (ref 0–1)
EOS ABS: 0 10*3/uL (ref 0.0–0.7)
Eosinophils Relative: 0 % (ref 0–5)
LYMPHS PCT: 25 % (ref 12–46)
Lymphs Abs: 3.7 10*3/uL (ref 0.7–4.0)
MONOS PCT: 5 % (ref 3–12)
Metamyelocytes Relative: 0 %
Monocytes Absolute: 0.7 10*3/uL (ref 0.1–1.0)
Myelocytes: 0 %
NEUTROS PCT: 70 % (ref 43–77)
NRBC: 38 /100{WBCs} — AB
Neutro Abs: 10.4 10*3/uL — ABNORMAL HIGH (ref 1.7–7.7)
Other: 0 %
Promyelocytes Absolute: 0 %

## 2014-10-29 LAB — RETICULOCYTES
RBC.: 2.57 MIL/uL — AB (ref 3.87–5.11)
RETIC COUNT ABSOLUTE: 244.2 10*3/uL — AB (ref 19.0–186.0)
RETIC CT PCT: 9.5 % — AB (ref 0.4–3.1)

## 2014-10-29 LAB — LACTATE DEHYDROGENASE: LDH: 237 U/L — ABNORMAL HIGH (ref 98–192)

## 2014-10-29 LAB — TYPE AND SCREEN
ABO/RH(D): B POS
Antibody Screen: NEGATIVE
UNIT DIVISION: 0
Unit division: 0

## 2014-10-29 LAB — CBC
HCT: 24.3 % — ABNORMAL LOW (ref 36.0–46.0)
Hemoglobin: 8.4 g/dL — ABNORMAL LOW (ref 12.0–15.0)
MCH: 28.6 pg (ref 26.0–34.0)
MCHC: 34.6 g/dL (ref 30.0–36.0)
MCV: 82.7 fL (ref 78.0–100.0)
PLATELETS: 272 10*3/uL (ref 150–400)
RBC: 2.94 MIL/uL — AB (ref 3.87–5.11)
RDW: 18.9 % — ABNORMAL HIGH (ref 11.5–15.5)
WBC: 14.9 10*3/uL — ABNORMAL HIGH (ref 4.0–10.5)

## 2014-10-29 LAB — WOUND CULTURE
Gram Stain: NONE SEEN
SPECIAL REQUESTS: NORMAL

## 2014-10-29 LAB — CULTURE, BLOOD (ROUTINE X 2)

## 2014-10-29 LAB — CULTURE, BLOOD (SINGLE)

## 2014-10-29 MED ORDER — CEFTRIAXONE SODIUM IN DEXTROSE 40 MG/ML IV SOLN
2.0000 g | INTRAVENOUS | Status: DC
  Administered 2014-10-29 – 2014-10-30 (×2): 2 g via INTRAVENOUS
  Filled 2014-10-29 (×2): qty 50

## 2014-10-29 MED ORDER — BUTALBITAL-APAP-CAFFEINE 50-325-40 MG PO TABS
1.0000 | ORAL_TABLET | ORAL | Status: DC | PRN
  Administered 2014-10-29 – 2014-10-30 (×2): 1 via ORAL
  Filled 2014-10-29 (×2): qty 1

## 2014-10-29 MED ORDER — POTASSIUM CHLORIDE 10 MEQ/100ML IV SOLN
10.0000 meq | INTRAVENOUS | Status: AC
  Administered 2014-10-29 (×4): 10 meq via INTRAVENOUS
  Filled 2014-10-29 (×4): qty 100

## 2014-10-29 NOTE — Progress Notes (Signed)
[place] for Infectious Disease  Date of Admission:  10/23/2014  Antibiotics: zosyn  Subjective: No new complaints, subjective fever  Objective: Temp:  [98.2 F (36.8 C)-98.7 F (37.1 C)] 98.3 F (36.8 C) (06/15 0900) Pulse Rate:  [77-113] 91 (06/14 2057) Resp:  [16-20] 18 (06/15 0900) BP: (86-120)/(35-69) 113/48 mmHg (06/14 2057) SpO2:  [88 %-100 %] 97 % (06/15 0900)  General: awake, alert Skin: no rashes Lungs: CTA B Cor: RRR   Lab Results Lab Results  Component Value Date   WBC 14.9* 10/29/2014   HGB 8.4* 10/29/2014   HCT 24.3* 10/29/2014   MCV 82.7 10/29/2014   PLT PENDING 10/29/2014    Lab Results  Component Value Date   CREATININE 0.45 10/28/2014   BUN <5* 10/28/2014   NA 138 10/28/2014   K 3.0* 10/28/2014   CL 110 10/28/2014   CO2 24 10/28/2014    Lab Results  Component Value Date   ALT 12* 10/28/2014   AST 19 10/28/2014   ALKPHOS 71 10/28/2014   BILITOT 4.3* 10/28/2014      Microbiology: Recent Results (from the past 240 hour(s))  Wound culture     Status: None   Collection Time: 10/26/14  9:15 AM  Result Value Ref Range Status   Specimen Description ARM PIC LINE  Final   Special Requests Normal  Final   Gram Stain   Final    NO WBC SEEN NO SQUAMOUS EPITHELIAL CELLS SEEN NO ORGANISMS SEEN Performed at [facility]    Culture   Final    MODERATE KLEBSIELLA PNEUMONIAE Performed at [facility]    Report Status 10/29/2014 FINAL  Final   Organism ID, Bacteria KLEBSIELLA PNEUMONIAE  Final      Susceptibility   Klebsiella pneumoniae - MIC*    AMPICILLIN >=32 RESISTANT Resistant     AMPICILLIN/SULBACTAM 4 SENSITIVE Sensitive     CEFAZOLIN <=4 SENSITIVE Sensitive     CEFEPIME <=1 SENSITIVE Sensitive     CEFTAZIDIME <=1 SENSITIVE Sensitive     CEFTRIAXONE <=1 SENSITIVE Sensitive     CIPROFLOXACIN <=0.25 SENSITIVE Sensitive     GENTAMICIN <=1 SENSITIVE Sensitive     IMIPENEM <=0.25 SENSITIVE Sensitive    PIP/TAZO <=4 SENSITIVE Sensitive     TOBRAMYCIN <=1 SENSITIVE Sensitive     TRIMETH/SULFA <=20 SENSITIVE Sensitive     * MODERATE KLEBSIELLA PNEUMONIAE  Culture, blood (routine x 2)     Status: None   Collection Time: 10/26/14 10:35 AM  Result Value Ref Range Status   Specimen Description BLOOD RIGHT ARM  Final   Special Requests BLOOD AEB  Final   Culture   Final    ENTEROBACTER CLOACAE Note: Gram Stain Report Called to,Read Back By and Verified With: CHELSEA DEAN 10/27/14 0810 BY SMITHERSJ Performed at [facility]    Report Status 10/29/2014 FINAL  Final   Organism ID, Bacteria ENTEROBACTER CLOACAE  Final      Susceptibility   Enterobacter cloacae - MIC*    CEFAZOLIN >=64 RESISTANT Resistant     CEFEPIME <=1 SENSITIVE Sensitive     CEFTAZIDIME <=1 SENSITIVE Sensitive     CEFTRIAXONE <=1 SENSITIVE Sensitive     CIPROFLOXACIN <=0.25 SENSITIVE Sensitive     GENTAMICIN <=1 SENSITIVE Sensitive     IMIPENEM <=0.25 SENSITIVE Sensitive     PIP/TAZO <=4 SENSITIVE Sensitive     TOBRAMYCIN <=1 SENSITIVE Sensitive     TRIMETH/SULFA <=20 SENSITIVE Sensitive     *  ENTEROBACTER CLOACAE  Culture, blood (routine x 2)     Status: None (Preliminary result)   Collection Time: 10/26/14 10:39 AM  Result Value Ref Range Status   Specimen Description BLOOD LEFT HAND  Final   Special Requests BLOOD BOTTLES DRAWN AEROBIC ONLY  Final   Culture   Final           BLOOD CULTURE RECEIVED NO GROWTH TO DATE CULTURE WILL BE HELD FOR 5 DAYS BEFORE ISSUING A FINAL NEGATIVE REPORT Performed at [facility]    Report Status PENDING  Incomplete  Culture, blood (single)     Status: None   Collection Time: 10/27/14  2:10 AM  Result Value Ref Range Status   Specimen Description BLOOD RIGHT ARM  Final   Special Requests BOTTLES DRAWN AEROBIC AND ANAEROBIC 20ML  Final   Culture   Final    ENTEROBACTER CLOACAE Note: SUSCEPTIBILITIES PERFORMED ON PREVIOUS CULTURE WITHIN THE LAST 5 DAYS. Note: Gram  Stain Report Called to,Read Back By and Verified With: Carole Binning 147AM ON [phone] BJENN Performed at [facility]    Report Status 10/29/2014 FINAL  Final  Culture, blood (routine x 2)     Status: None (Preliminary result)   Collection Time: 10/28/14  9:25 AM  Result Value Ref Range Status   Specimen Description BLOOD  PICC  Final   Special Requests  10 ML BAA  Final   Culture   Final           BLOOD CULTURE RECEIVED NO GROWTH TO DATE CULTURE WILL BE HELD FOR 5 DAYS BEFORE ISSUING A FINAL NEGATIVE REPORT Performed at [facility]    Report Status PENDING  Incomplete  Culture, blood (routine x 2)     Status: None (Preliminary result)   Collection Time: 10/28/14  9:30 AM  Result Value Ref Range Status   Specimen Description BLOOD  PICC  Final   Special Requests  10 ML BAA  Final   Culture   Final           BLOOD CULTURE RECEIVED NO GROWTH TO DATE CULTURE WILL BE HELD FOR 5 DAYS BEFORE ISSUING A FINAL NEGATIVE REPORT Performed at [facility]    Report Status PENDING  Incomplete    Studies/Results: Ir Fluoro Guide Cv Line Right  10/27/2014   CLINICAL DATA:  24 year old with sickle cell anemia, third trimester pregnancy and concern for a left arm PICC line infection. Request for new PICC line placement.  EXAM: FLUOROSCOPIC AND ULTRASOUND GUIDED PLACEMENT OF RIGHT ARM PICC LINE  FLUOROSCOPY TIME:  6 seconds, 2.3 mGy  TECHNIQUE: The procedure was explained to the patient. The risks and benefits of the procedure were discussed and the patient's questions were addressed. Specifically, the risks of radiation were pregnancy were discussed with the patient. Informed consent was obtained from the patient. The patient's abdomen and pelvis was shielded with lead.  The right arm was prepped with chlorhexidine, draped in the usual sterile fashion using maximum barrier technique (cap and mask, sterile gown, sterile gloves, large sterile sheet, hand hygiene and cutaneous  antiseptic). Local anesthesia was attained by infiltration with 1% lidocaine.  Ultrasound demonstrated multiple right upper extremity veins. Prominent vein which probably represents a brachial vein was targeted. Under real-time ultrasound guidance, this vein was accessed with a 21 gauge micropuncture needle and image documentation was performed. The needle was exchanged over a guidewire for a peel-away sheath through which a 36 cm 5 [place] dual  lumen power injectable PICC was advanced, and positioned with its tip at the lower SVC/right atrial junction. Fluoroscopy during the procedure and fluoro spot radiograph confirms appropriate catheter position. The catheter was flushed, secured to the skin, and covered with a sterile dressing. Left arm PICC line was removed at the end of the procedure.  FINDINGS: Catheter tip near the superior cavoatrial junction.  COMPLICATIONS: None.  The patient tolerated the procedure well.  IMPRESSION: Successful placement of a right arm PICC with sonographic and fluoroscopic guidance. The catheter is ready for use.   Electronically Signed   By: Markus Daft M.D.   On: 10/27/2014 17:42   Ir US Guide Vasc Access Right  10/27/2014   CLINICAL DATA:  24 year old with sickle cell anemia, third trimester pregnancy and concern for a left arm PICC line infection. Request for new PICC line placement.  EXAM: FLUOROSCOPIC AND ULTRASOUND GUIDED PLACEMENT OF RIGHT ARM PICC LINE  FLUOROSCOPY TIME:  6 seconds, 2.3 mGy  TECHNIQUE: The procedure was explained to the patient. The risks and benefits of the procedure were discussed and the patient's questions were addressed. Specifically, the risks of radiation were pregnancy were discussed with the patient. Informed consent was obtained from the patient. The patient's abdomen and pelvis was shielded with lead.  The right arm was prepped with chlorhexidine, draped in the usual sterile fashion using maximum barrier technique (cap and mask, sterile gown,  sterile gloves, large sterile sheet, hand hygiene and cutaneous antiseptic). Local anesthesia was attained by infiltration with 1% lidocaine.  Ultrasound demonstrated multiple right upper extremity veins. Prominent vein which probably represents a brachial vein was targeted. Under real-time ultrasound guidance, this vein was accessed with a 21 gauge micropuncture needle and image documentation was performed. The needle was exchanged over a guidewire for a peel-away sheath through which a 36 cm 5 [place] dual lumen power injectable PICC was advanced, and positioned with its tip at the lower SVC/right atrial junction. Fluoroscopy during the procedure and fluoro spot radiograph confirms appropriate catheter position. The catheter was flushed, secured to the skin, and covered with a sterile dressing. Left arm PICC line was removed at the end of the procedure.  FINDINGS: Catheter tip near the superior cavoatrial junction.  COMPLICATIONS: None.  The patient tolerated the procedure well.  IMPRESSION: Successful placement of a right arm PICC with sonographic and fluoroscopic guidance. The catheter is ready for use.   Electronically Signed   By: Markus Daft M.D.   On: 10/27/2014 17:42    Assessment/Plan:  1) picc line infection with Enterobacter - Repeat blood cultures negative.  Changed to ceftriaxone and should continue with that for 14 days total from picc line change 6/13 through 6/26. She will need to have the daily infusion arranged through the Sickle Cell clinic Mon-Friday and at [facility] hospital on Sat and Sundays.   Duration of picc line after completion of antibiotics per Dr. Rodena Piety, she may need to leave it in for purposes of transfusion, etc...  She should get a CBC and CMP in about 7 days  I will sign off, please call with questions  Scharlene Gloss, [place] for Infectious Disease [place] [email or web address] [phone] pager   [phone] cell 10/29/2014, 12:06 PM

## 2014-10-29 NOTE — Progress Notes (Addendum)
Patient ID: [name], female   DOB: 1990-08-28, 24 y.o.   MRN: [id number]  FACULTY PRACTICE ANTEPARTUM NOTE  [name] is a 24 y.o. G2P0010 at [redacted]w[redacted]d  who is admitted for sickle cell crisis, PICC line infection.   Fetal presentation is cephalic. Length of Stay:  6  Days  Subjective:  Patient reports no fevers overnight.  Improvement of reflux with protonix.  Pain controlled, although has headache.  No elevated BP, nausea, abdominal pain.  Patient reports good fetal movement.  She reports no uterine contractions, no bleeding and no loss of fluid per vagina.  Vitals:  Blood pressure 113/48, pulse 91, temperature 98.7 F (37.1 C), temperature source Oral, resp. rate 18, height 5\' 2"  (1.575 m), weight 163 lb (73.936 kg), last menstrual period 03/20/2014, SpO2 96 %. Physical Examination:  General appearance - alert, well appearing, and in no distress Chest - clear to auscultation, no wheezes, rales or rhonchi, symmetric air entry Heart - normal rate, regular rhythm, normal S1, S2, no murmurs, rubs, clicks or gallops Abdomen - soft, nontender, nondistended, no masses or organomegaly Fundal Height:  size equals dates Extremities: extremities normal, atraumatic, no cyanosis or edema  Membranes:intact  Fetal Monitoring:  Baseline: 130s bpm, Variability: Good {> 6 bpm), Accelerations: Reactive and Decelerations: Absent  Labs:  Results for orders placed or performed during the hospital encounter of 10/23/14 (from the past 24 hour(s))  CBC with Differential/Platelet   Collection Time: 10/28/14  9:30 AM  Result Value Ref Range   WBC 20.5 (H) 4.0 - 10.5 K/uL   RBC 2.33 (L) 3.87 - 5.11 MIL/uL   Hemoglobin 6.6 (LL) 12.0 - 15.0 g/dL   HCT 19.0 (L) 36.0 - 46.0 %   MCV 81.5 78.0 - 100.0 fL   MCH 28.3 26.0 - 34.0 pg   MCHC 34.7 30.0 - 36.0 g/dL   RDW 20.6 (H) 11.5 - 15.5 %   Platelets 278 150 - 400 K/uL   Neutrophils Relative % 78 (H) 43 - 77 %   Lymphocytes Relative 12 12 - 46 %   Monocytes Relative 9 3 - 12 %   Eosinophils Relative 1 0 - 5 %   Basophils Relative 0 0 - 1 %   Neutro Abs 16.0 (H) 1.7 - 7.7 K/uL   Lymphs Abs 2.5 0.7 - 4.0 K/uL   Monocytes Absolute 1.8 (H) 0.1 - 1.0 K/uL   Eosinophils Absolute 0.2 0.0 - 0.7 K/uL   Basophils Absolute 0.0 0.0 - 0.1 K/uL  Reticulocytes   Collection Time: 10/28/14  9:30 AM  Result Value Ref Range   Retic Ct Pct 11.6 (H) 0.4 - 3.1 %   RBC. 2.33 (L) 3.87 - 5.11 MIL/uL   Retic Count, Manual 270.3 (H) 19.0 - 186.0 K/uL  Comprehensive metabolic panel   Collection Time: 10/28/14  9:30 AM  Result Value Ref Range   Sodium 138 135 - 145 mmol/L   Potassium 3.0 (L) 3.5 - 5.1 mmol/L   Chloride 110 101 - 111 mmol/L   CO2 24 22 - 32 mmol/L   Glucose, Bld 81 65 - 99 mg/dL   BUN <5 (L) 6 - 20 mg/dL   Creatinine, Ser 0.45 0.44 - 1.00 mg/dL   Calcium 8.1 (L) 8.9 - 10.3 mg/dL   Total Protein 5.2 (L) 6.5 - 8.1 g/dL   Albumin 2.4 (L) 3.5 - 5.0 g/dL   AST 19 15 - 41 U/L   ALT 12 (L) 14 - 54 U/L  Alkaline Phosphatase 71 38 - 126 U/L   Total Bilirubin 4.3 (H) 0.3 - 1.2 mg/dL   GFR calc non Af Amer >60 >60 mL/min   GFR calc Af Amer >60 >60 mL/min   Anion gap 4 (L) 5 - 15  Type and screen for Sickle Cell Protocol   Collection Time: 10/28/14  9:30 AM  Result Value Ref Range   ABO/RH(D) B POS    Antibody Screen NEG    Sample Expiration 10/31/2014    Unit Number [id number]    Blood Component Type RED CELLS,LR    Unit division 00    Status of Unit ISSUED    Transfusion Status OK TO TRANSFUSE    Crossmatch Result Compatible    Unit Number [id number]    Blood Component Type RED CELLS,LR    Unit division 00    Status of Unit ISSUED    Transfusion Status OK TO TRANSFUSE    Crossmatch Result Compatible   Prepare RBC   Collection Time: 10/28/14 12:50 PM  Result Value Ref Range   Order Confirmation ORDER PROCESSED BY BLOOD BANK     Imaging Studies:    none   Medications:  Scheduled . acetaminophen  650 mg Oral Once  .  diphenhydrAMINE  25 mg Oral Once  . docusate sodium  100 mg Oral BID  . enoxaparin (LOVENOX) injection  40 mg Subcutaneous Q24H  . feeding supplement (ENSURE ENLIVE)  237 mL Oral BID BM  . folic acid  2 mg Oral Daily  . methadone  5 mg Oral QHS  . oxyCODONE  15 mg Oral Q4H  . pantoprazole (PROTONIX) IV  40 mg Intravenous Q24H  . piperacillin-tazobactam (ZOSYN)  IV  3.375 g Intravenous 3 times per day  . potassium chloride  10 mEq Intravenous Q1 Hr x 4  . prenatal vitamin w/FE, FA  1 tablet Oral Q1200  . sodium chloride  10-40 mL Intracatheter Q12H   I have reviewed the patient's current medications.  ASSESSMENT: Patient Active Problem List   Diagnosis Date Noted  . Hypokalemia 10/29/2014  . Hb-SS disease with crisis   . Line sepsis 10/27/2014  . Sickle cell crisis 10/23/2014  . 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 Supervision of high-risk pregnancy 06/11/2014  . Chronic pain 02/26/2013  . Anemia of chronic disease 10/05/2012  . 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 Mood swings 11/08/2011  . Migraines 11/08/2011  . Overweight(278.02) 05/24/2011  . GERD (gastroesophageal reflux disease) 02/17/2011  . Depression 01/06/2011  . Back pain 09/17/2010  . Sickle cell disease 01/08/2009  . 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  PICC line Sepsis - no current fever Second cultures drawn yesterday - still pending GNR - continue Zosyn. CBC pending today.  2.  Sickle Cell Crisis Hg 6.6 yesterday - s/p 2 units PRBCs Hg pending  3.  IUP NST reactive. Normal fundal height.  4.  Hypokalemia -  Replace potassium IV  Continue routine antenatal care.   Truett Mainland, DO 10/29/2014,7:19 AM

## 2014-10-30 ENCOUNTER — Telehealth: Payer: Self-pay | Admitting: Internal Medicine

## 2014-10-30 ENCOUNTER — Ambulatory Visit: Payer: Medicare Other | Admitting: Internal Medicine

## 2014-10-30 DIAGNOSIS — D571 Sickle-cell disease without crisis: Secondary | ICD-10-CM

## 2014-10-30 DIAGNOSIS — G8929 Other chronic pain: Secondary | ICD-10-CM

## 2014-10-30 MED ORDER — CYCLOBENZAPRINE HCL 10 MG PO TABS: 10.0000 mg | ORAL_TABLET | Freq: Three times a day (TID) | ORAL | Status: DC | PRN

## 2014-10-30 MED ORDER — HEPARIN SOD (PORK) LOCK FLUSH 100 UNIT/ML IV SOLN
250.0000 [IU] | INTRAVENOUS | Status: AC | PRN
Start: 2014-10-30 — End: 2014-10-30
  Administered 2014-10-30 (×2): 250 [IU]
  Filled 2014-10-30: qty 3

## 2014-10-30 MED ORDER — OXYCODONE HCL 15 MG PO TABS: 15.0000 mg | ORAL_TABLET | ORAL | Status: DC

## 2014-10-30 MED ORDER — METHADONE HCL 5 MG PO TABS: 5.0000 mg | ORAL_TABLET | Freq: Every day | ORAL | Status: DC

## 2014-10-30 NOTE — Telephone Encounter (Signed)
Patient would like to know if there is an alternative to daily antibiotic infusions.

## 2014-10-30 NOTE — Telephone Encounter (Signed)
Patient called in today to scheduled an appointment to come in on Monday 11/03/2014, she says she is still admitted in [facility] but should be getting discharged today or tomorrow. She states she is completely out of oral medication and is worried she will not have any over the weekend. She is request a refill on Oxycodone 15mg  and Methadone 5mg  to be picked up tomorrow 10/31/2014. Please advise. LOV  07/21/2014

## 2014-10-30 NOTE — Telephone Encounter (Signed)
Prescription written for Methadone 5 mg #30tabs. [facility] reviewed and no inconsistencies noted. He Oxycodone is due is 6 days and will be available during her appointment on Monday.   Please note that printer currently unavailable so hadwritten prescription issued.

## 2014-10-30 NOTE — Discharge Summary (Signed)
Physician Discharge Summary  Patient ID: [name] MRN: [id number] DOB/AGE: [birth year] 24 y.o.  Admit date: 10/23/2014 Discharge date: 10/30/2014  Admission Diagnoses: Intrauterine pregnancy at [redacted]w[redacted]d; Sickle cell crisis  Discharge Diagnoses: Intrauterine pregnancy at [redacted]w[redacted]d Principal Problem:   Sickle cell crisis Active Problems:   Back pain   Line sepsis   Hb-SS disease with crisis   Hypokalemia   Bacteremia associated with intravascular line   Discharged Condition: Stable  Hospital Course: Patient was admitted with sickle cell crisis at [redacted]w[redacted]d; she has a history of frequent admissions fort episodes of crisis and opiate tolerance.  She had been started on Methadone for her pain but felt her pain was not controlled.  She was started on Dilaudid PCA and other oral analgesics and closely monitored. She received blood transfusion.  Patient was noted to have leukocytosis and fever to 103 on 10/25/14 which was concerning for infection; CXR was normal and cultures were obtained.  She had recurrent fevers and was subsequently noted to have purulent drainage from PICC site and concern was about PICC line sepsis  The PICC line was removed and replaced with a new PICC line. She was initially started on Vancomycin but blood cultures were positive for GNR, which prompted change of therapy to Zosyn; this was done with the recommendation of Dr. Linus Salmons from ID.  Further speciation showed Enterobacter cloacae and Klebsiella pneumoniae, antibiotic was changed to Ceftriaxone.  The recommendation was for 14 days of therapy 6/13 through 6/26; with daily infusion arranged through the Sickle Cell clinic Mon-Friday and at [facility] hospital on Sat and Sundays.  According to phone conversation wih Dr. Jonelle Sidle, this was arranged for patient.  Her pain improved; was on Oxycodone and Methadone and Dilaudid PCA prior to discharge.  Her fetal heart rate monitoring remained reassuring, and she had no signs/symptoms of  preterm  labor or other maternal-fetal concerns.   She was deemed stable for discharge to home with outpatient follow up as arranged.  Consults: ID and Sickle Cell Service  Significant Diagnostic Studies: CBC Latest Ref Rng 10/29/2014 10/28/2014 10/27/2014  WBC 4.0 - 10.5 K/uL 14.9(H) 20.5(H) 31.4(H)  Hemoglobin 12.0 - 15.0 g/dL 8.4(L) 6.6(LL) 7.3(L)  Hematocrit 36.0 - 46.0 % 24.3(L) 19.0(L) 20.6(L)  Platelets 150 - 400 K/uL 272 278 241     Ref Range 1d ago (10/29/14) 2d ago (10/28/14) 2wk ago (10/11/14) 42mo ago (09/18/14) 22mo ago (09/08/14)  Retic Ct Pct 0.4 - 3.1 % 9.5 (H) 11.6 (H) 12.7 (H) 15.6 (H) 18.5 (H)  RBC. 3.87 - 5.11 MIL/uL 2.57 (L) 2.33 (L) 3.22 (L) 2.97 (L) 2.44 (L)  Retic Count, Manual 19.0 - 186.0 K/uL 244.2 (H) 270.3 (H) 408.9 (H) 463.3 (H) 451.4 (H)         Ref. Range 08/15/2014 05:30 09/08/2014 05:50 09/19/2014 06:20 09/20/2014 05:45 10/29/2014 09:25  LDH Latest Ref Range: 98-192 U/L 247 204 252 (H) 281 (H) 237 (H)   Results for orders placed or performed during the hospital encounter of 10/23/14  Wound culture     Status: None   Collection Time: 10/26/14  9:15 AM  Result Value Ref Range Status   Specimen Description ARM PIC LINE  Final   Special Requests Normal  Final   Gram Stain   Final    NO WBC SEEN NO SQUAMOUS EPITHELIAL CELLS SEEN NO ORGANISMS SEEN Performed at [facility]    Culture   Final    [place] Performed at [facility]  Report Status 10/29/2014 FINAL  Final   Organism ID, Bacteria KLEBSIELLA PNEUMONIAE  Final      Susceptibility   Klebsiella pneumoniae - MIC*    AMPICILLIN >=32 RESISTANT Resistant     AMPICILLIN/SULBACTAM 4 SENSITIVE Sensitive     CEFAZOLIN <=4 SENSITIVE Sensitive     CEFEPIME <=1 SENSITIVE Sensitive     CEFTAZIDIME <=1 SENSITIVE Sensitive     CEFTRIAXONE <=1 SENSITIVE Sensitive     CIPROFLOXACIN <=0.25 SENSITIVE Sensitive     GENTAMICIN <=1 SENSITIVE Sensitive     IMIPENEM <=0.25 SENSITIVE  Sensitive     PIP/TAZO <=4 SENSITIVE Sensitive     TOBRAMYCIN <=1 SENSITIVE Sensitive     TRIMETH/SULFA <=20 SENSITIVE Sensitive     * MODERATE KLEBSIELLA PNEUMONIAE  Culture, blood (routine x 2)     Status: None   Collection Time: 10/26/14 10:35 AM  Result Value Ref Range Status   Specimen Description BLOOD RIGHT ARM  Final   Special Requests BLOOD AEB  Final   Culture   Final    ENTEROBACTER CLOACAE Note: Gram Stain Report Called to,Read Back By and Verified With: CHELSEA DEAN 10/27/14 0810 BY SMITHERSJ Performed at [facility]    Report Status 10/29/2014 FINAL  Final   Organism ID, Bacteria ENTEROBACTER CLOACAE  Final      Susceptibility   Enterobacter cloacae - MIC*    CEFAZOLIN >=64 RESISTANT Resistant     CEFEPIME <=1 SENSITIVE Sensitive     CEFTAZIDIME <=1 SENSITIVE Sensitive     CEFTRIAXONE <=1 SENSITIVE Sensitive     CIPROFLOXACIN <=0.25 SENSITIVE Sensitive     GENTAMICIN <=1 SENSITIVE Sensitive     IMIPENEM <=0.25 SENSITIVE Sensitive     PIP/TAZO <=4 SENSITIVE Sensitive     TOBRAMYCIN <=1 SENSITIVE Sensitive     TRIMETH/SULFA <=20 SENSITIVE Sensitive     * ENTEROBACTER CLOACAE  Culture, blood (routine x 2)     Status: None (Preliminary result)   Collection Time: 10/26/14 10:39 AM  Result Value Ref Range Status   Specimen Description BLOOD LEFT HAND  Final   Special Requests BLOOD BOTTLES DRAWN AEROBIC ONLY  Final   Culture   Final           BLOOD CULTURE RECEIVED NO GROWTH TO DATE CULTURE WILL BE HELD FOR 5 DAYS BEFORE ISSUING A FINAL NEGATIVE REPORT Performed at [facility]    Report Status PENDING  Incomplete  Culture, blood (single)     Status: None   Collection Time: 10/27/14  2:10 AM  Result Value Ref Range Status   Specimen Description BLOOD RIGHT ARM  Final   Special Requests BOTTLES DRAWN AEROBIC AND ANAEROBIC 20ML  Final   Culture   Final    ENTEROBACTER CLOACAE Note: SUSCEPTIBILITIES PERFORMED ON PREVIOUS CULTURE WITHIN THE LAST 5  DAYS. Note: Gram Stain Report Called to,Read Back By and Verified With: Carole Binning 147AM ON [phone] BJENN Performed at [facility]    Report Status 10/29/2014 FINAL  Final  Culture, blood (routine x 2)     Status: None (Preliminary result)   Collection Time: 10/28/14  9:25 AM  Result Value Ref Range Status   Specimen Description BLOOD  PICC  Final   Special Requests  10 ML BAA  Final   Culture   Final           BLOOD CULTURE RECEIVED NO GROWTH TO DATE CULTURE WILL BE HELD FOR 5 DAYS BEFORE ISSUING A FINAL NEGATIVE REPORT  Performed at [facility]    Report Status PENDING  Incomplete  Culture, blood (routine x 2)     Status: None (Preliminary result)   Collection Time: 10/28/14  9:30 AM  Result Value Ref Range Status   Specimen Description BLOOD  PICC  Final   Special Requests  10 ML BAA  Final   Culture   Final           BLOOD CULTURE RECEIVED NO GROWTH TO DATE CULTURE WILL BE HELD FOR 5 DAYS BEFORE ISSUING A FINAL NEGATIVE REPORT Performed at [facility]    Report Status PENDING  Incomplete      Treatments: Antibiotics:  Vancomycin -> Zosyn -> Ceftriaxone, Procedures: PICC line and transfusion of 2 units of pRBCs  Discharge Exam: Blood pressure 104/55, pulse 86, temperature 98.4 F (36.9 C), temperature source Oral, resp. rate 18, height 5\' 2"  (1.575 m), weight 181 lb (82.101 kg), last menstrual period 03/20/2014, SpO2 97 %. General appearance: alert and no distress Head: Normocephalic, without obvious abnormality, atraumatic Eyes: conjunctivae/corneas clear. PERRL, EOM's intact. Fundi benign. Neck: supple, symmetrical, trachea midline GI: soft, gravid, non-tender Pelvic: deferred Extremities: extremities normal, atraumatic, no cyanosis or edema and Homans sign is negative, no sign of DVT Neurologic: Alert and oriented X 3, normal strength and tone. Normal symmetric reflexes. Normal coordination and gait  Disposition: 01-Home or Self Care      Medication List    TAKE these medications        acetaminophen 500 MG tablet  Commonly known as:  TYLENOL  Take 1,000 mg by mouth every 6 (six) hours as needed for moderate pain or headache.     [facility] PRENATAL GUMMY 0.4-113.5 MG Chew  Chew 2 each by mouth daily.     cyclobenzaprine 10 MG tablet  Commonly known as:  FLEXERIL  Take 1 tablet (10 mg total) by mouth 3 (three) times daily as needed for muscle spasms.     docusate sodium 100 MG capsule  Commonly known as:  COLACE  Take 1 capsule (100 mg total) by mouth daily.     folic acid 1 MG tablet  Commonly known as:  FOLVITE  Take 2 tablets (2 mg total) by mouth daily.     methadone 5 MG tablet  Commonly known as:  DOLOPHINE  Take 1 tablet (5 mg total) by mouth at bedtime.     oxyCODONE 15 MG immediate release tablet  Commonly known as:  ROXICODONE  Take 1 tablet (15 mg total) by mouth every 4 (four) hours.       Follow-up Information    Please follow up.   Why:  as scheduled with PCP tomorrow      Signed: Osborne Oman, MD 10/30/2014, 12:55 PM

## 2014-10-31 ENCOUNTER — Ambulatory Visit (HOSPITAL_COMMUNITY)
Admission: RE | Admit: 2014-10-31 | Discharge: 2014-10-31 | Disposition: A | Discharge status: Home / Self Care | Payer: Medicare Other | Source: Ambulatory Visit | Attending: Internal Medicine | Admitting: Internal Medicine

## 2014-10-31 ENCOUNTER — Telehealth: Payer: Self-pay | Admitting: Family Medicine

## 2014-10-31 ENCOUNTER — Other Ambulatory Visit: Payer: Self-pay | Admitting: Family Medicine

## 2014-10-31 DIAGNOSIS — R7881 Bacteremia: Secondary | ICD-10-CM

## 2014-10-31 DIAGNOSIS — D5700 Hb-SS disease with crisis, unspecified: Secondary | ICD-10-CM | POA: Diagnosis present

## 2014-10-31 DIAGNOSIS — T827XXD Infection and inflammatory reaction due to other cardiac and vascular devices, implants and grafts, subsequent encounter: Principal | ICD-10-CM

## 2014-10-31 DIAGNOSIS — Z0183 Encounter for blood typing: Secondary | ICD-10-CM | POA: Diagnosis not present

## 2014-10-31 MED ORDER — SODIUM CHLORIDE 0.9 % IJ SOLN
10.0000 mL | INTRAMUSCULAR | Status: AC | PRN
  Administered 2014-10-31: 10 mL

## 2014-10-31 MED ORDER — HEPARIN SOD (PORK) LOCK FLUSH 100 UNIT/ML IV SOLN
250.0000 [IU] | INTRAVENOUS | Status: AC | PRN
  Administered 2014-10-31: 250 [IU]
  Filled 2014-10-31: qty 5

## 2014-10-31 MED ORDER — CEFTRIAXONE SODIUM IN DEXTROSE 40 MG/ML IV SOLN
2.0000 g | INTRAVENOUS | Status: DC
  Administered 2014-10-31: 2 g via INTRAVENOUS
  Filled 2014-10-31: qty 50

## 2014-10-31 NOTE — Procedures (Signed)
Associated Diagnosis: Bacteremia associated with intravascular line; 909.3 MD: Zigmund Daniel  Procedure Note: PICC line accessed, flushed, blood return noted, antibiotic infused, PICC line flushed and heparinized.  Condition during procedure: Tolerated well Condition after procedure: Alert, oriented and ambulatory; no complaints

## 2014-10-31 NOTE — Telephone Encounter (Signed)
[name], a 24 year old patient with a history of sickle cell anemia, HbSS, in 3rd trimester of pregnancy was discharged from [facility] on 10/30/2014 after a picc line infection. PICC line was removed, patient will require 14 days of IV antibiotics, being that PICC line cultures yielded Enterobacter cloacae.   I spoke with Dr. Scharlene Gloss, Infectious disease, who states that patient will need 14 days of Ceftriaxone 2 grams via PICC line.   Patient is currently inquiring about oral antibiotics due to transportation constraints. Patient informed that we will not be able to substitute oral antibiotics.     Dorena Dew, FNP

## 2014-11-01 ENCOUNTER — Encounter (HOSPITAL_COMMUNITY): Payer: Medicare Other

## 2014-11-01 LAB — CULTURE, BLOOD (ROUTINE X 2): Culture: NO GROWTH

## 2014-11-02 ENCOUNTER — Ambulatory Visit (HOSPITAL_COMMUNITY)
Admission: RE | Admit: 2014-11-02 | Discharge: 2014-11-02 | Disposition: A | Discharge status: Home / Self Care | Payer: Medicare Other | Source: Ambulatory Visit | Attending: Internal Medicine | Admitting: Internal Medicine

## 2014-11-02 DIAGNOSIS — R7881 Bacteremia: Secondary | ICD-10-CM | POA: Insufficient documentation

## 2014-11-02 MED ORDER — SODIUM CHLORIDE 0.9 % IJ SOLN
10.0000 mL | INTRAMUSCULAR | Status: DC | PRN
  Administered 2014-11-02: 20 mL
  Filled 2014-11-02: qty 40

## 2014-11-02 MED ORDER — DEXTROSE 5 % IV SOLN
2.0000 g | Freq: Once | INTRAVENOUS | Status: AC
  Administered 2014-11-02: 2 g via INTRAVENOUS
  Filled 2014-11-02: qty 2

## 2014-11-02 MED ORDER — HEPARIN SOD (PORK) LOCK FLUSH 100 UNIT/ML IV SOLN
250.0000 [IU] | INTRAVENOUS | Status: AC | PRN
  Administered 2014-11-02: 250 [IU]

## 2014-11-03 ENCOUNTER — Ambulatory Visit: Payer: Medicare Other | Admitting: Internal Medicine

## 2014-11-03 ENCOUNTER — Ambulatory Visit (INDEPENDENT_AMBULATORY_CARE_PROVIDER_SITE_OTHER): Payer: Medicare Other | Admitting: Family Medicine

## 2014-11-03 ENCOUNTER — Ambulatory Visit (HOSPITAL_COMMUNITY)
Admission: RE | Admit: 2014-11-03 | Discharge: 2014-11-03 | Disposition: A | Discharge status: Home / Self Care | Payer: Medicare Other | Source: Ambulatory Visit | Attending: Internal Medicine | Admitting: Internal Medicine

## 2014-11-03 ENCOUNTER — Encounter: Payer: Self-pay | Admitting: Family Medicine

## 2014-11-03 VITALS — BP 110/67 | HR 100 | Temp 99.5°F | Resp 16 | Ht 62.0 in | Wt 171.0 lb

## 2014-11-03 DIAGNOSIS — Z3493 Encounter for supervision of normal pregnancy, unspecified, third trimester: Secondary | ICD-10-CM

## 2014-11-03 DIAGNOSIS — R7881 Bacteremia: Secondary | ICD-10-CM | POA: Diagnosis not present

## 2014-11-03 DIAGNOSIS — Z0183 Encounter for blood typing: Secondary | ICD-10-CM | POA: Diagnosis not present

## 2014-11-03 DIAGNOSIS — D571 Sickle-cell disease without crisis: Secondary | ICD-10-CM

## 2014-11-03 DIAGNOSIS — G8929 Other chronic pain: Secondary | ICD-10-CM | POA: Diagnosis not present

## 2014-11-03 DIAGNOSIS — Z331 Pregnant state, incidental: Secondary | ICD-10-CM | POA: Diagnosis not present

## 2014-11-03 LAB — CULTURE, BLOOD (ROUTINE X 2)
CULTURE: NO GROWTH
Culture: NO GROWTH
Special Requests: 10
Special Requests: 10

## 2014-11-03 MED ORDER — HEPARIN SOD (PORK) LOCK FLUSH 100 UNIT/ML IV SOLN
500.0000 [IU] | INTRAVENOUS | Status: AC | PRN
  Administered 2014-11-03: 500 [IU]
  Filled 2014-11-03: qty 5

## 2014-11-03 MED ORDER — SODIUM CHLORIDE 0.9 % IJ SOLN
10.0000 mL | INTRAMUSCULAR | Status: AC | PRN
  Administered 2014-11-03: 10 mL

## 2014-11-03 MED ORDER — CEFTRIAXONE SODIUM IN DEXTROSE 40 MG/ML IV SOLN
2.0000 g | Freq: Once | INTRAVENOUS | Status: AC
Start: 2014-11-03 — End: 2014-11-03
  Administered 2014-11-03: 2 g via INTRAVENOUS
  Filled 2014-11-03: qty 50

## 2014-11-03 MED ORDER — OXYCODONE HCL 15 MG PO TABS: 15.0000 mg | ORAL_TABLET | ORAL | Status: DC | PRN

## 2014-11-03 NOTE — Patient Instructions (Signed)
Sickle Cell Anemia, Adult Sickle cell anemia is a condition in which red blood cells have an abnormal "sickle" shape. This abnormal shape shortens the cells' life span, which results in a lower than normal concentration of red blood cells in the blood. The sickle shape also causes the cells to clump together and block free blood flow through the blood vessels. As a result, the tissues and organs of the body do not receive enough oxygen. Sickle cell anemia causes organ damage and pain and increases the risk of infection. CAUSES  Sickle cell anemia is a genetic disorder. Those who receive two copies of the gene have the condition, and those who receive one copy have the trait. RISK FACTORS The sickle cell gene is most common in people whose families originated in [place]. Other areas of the globe where sickle cell trait occurs include the Mediterranean, [place] and [place], the [place], and the [place].  SIGNS AND SYMPTOMS  Pain, especially in the extremities, back, chest, or abdomen (common). The pain may start suddenly or may develop following an illness, especially if there is dehydration. Pain can also occur due to overexertion or exposure to extreme temperature changes.  Frequent severe bacterial infections, especially certain types of pneumonia and meningitis.  Pain and swelling in the hands and feet.  Decreased activity.   Loss of appetite.   Change in behavior.  Headaches.  Seizures.  Shortness of breath or difficulty breathing.  Vision changes.  Skin ulcers. Those with the trait may not have symptoms or they may have mild symptoms.  DIAGNOSIS  Sickle cell anemia is diagnosed with blood tests that demonstrate the genetic trait. It is often diagnosed during the newborn period, due to mandatory testing nationwide. A variety of blood tests, X-rays, CT scans, MRI scans, ultrasounds, and lung function tests may also be done to monitor the condition. TREATMENT  Sickle  cell anemia may be treated with:  Medicines. You may be given pain medicines, antibiotic medicines (to treat and prevent infections) or medicines to increase the production of certain types of hemoglobin.  Fluids.  Oxygen.  Blood transfusions. HOME CARE INSTRUCTIONS   Drink enough fluid to keep your urine clear or pale yellow. Increase your fluid intake in hot weather and during exercise.  Do not smoke. Smoking lowers oxygen levels in the blood.   Only take over-the-counter or prescription medicines for pain, fever, or discomfort as directed by your health care provider.  Take antibiotics as directed by your health care provider. Make sure you finish them it even if you start to feel better.   Take supplements as directed by your health care provider.   Consider wearing a medical alert bracelet. This tells anyone caring for you in an emergency of your condition.   When traveling, keep your medical information, health care provider's names, and the medicines you take with you at all times.   If you develop a fever, do not take medicines to reduce the fever right away. This could cover up a problem that is developing. Notify your health care provider.  Keep all follow-up appointments with your health care provider. Sickle cell anemia requires regular medical care. SEEK MEDICAL CARE IF: You have a fever. SEEK IMMEDIATE MEDICAL CARE IF:   You feel dizzy or faint.   You have new abdominal pain, especially on the left side near the stomach area.   You develop a persistent, often uncomfortable and painful penile erection (priapism). If this is not treated immediately it   will lead to impotence.   You have numbness your arms or legs or you have a hard time moving them.   You have a hard time with speech.   You have a fever or persistent symptoms for more than 2-3 days.   You have a fever and your symptoms suddenly get worse.   You have signs or symptoms of infection.  These include:   Chills.   Abnormal tiredness (lethargy).   Irritability.   Poor eating.   Vomiting.   You develop pain that is not helped with medicine.   You develop shortness of breath.  You have pain in your chest.   You are coughing up pus-like or bloody sputum.   You develop a stiff neck.  Your feet or hands swell or have pain.  Your abdomen appears bloated.  You develop joint pain. MAKE SURE YOU:  Understand these instructions. Document Released: 08/10/2005 Document Revised: 09/16/2013 Document Reviewed: 12/12/2012 [facility] Patient Information 2015 [place], [place]. This information is not intended to replace advice given to you by your health care provider. Make sure you discuss any questions you have with your health care provider. Chronic Back Pain  When back pain lasts longer than 3 months, it is called chronic back pain.People with chronic back pain often go through certain periods that are more intense (flare-ups).  CAUSES Chronic back pain can be caused by wear and tear (degeneration) on different structures in your back. These structures include:  The bones of your spine (vertebrae) and the joints surrounding your spinal cord and nerve roots (facets).  The strong, fibrous tissues that connect your vertebrae (ligaments). Degeneration of these structures may result in pressure on your nerves. This can lead to constant pain. HOME CARE INSTRUCTIONS  Avoid bending, heavy lifting, prolonged sitting, and activities which make the problem worse.  Take brief periods of rest throughout the day to reduce your pain. Lying down or standing usually is better than sitting while you are resting.  Take over-the-counter or prescription medicines only as directed by your caregiver. SEEK IMMEDIATE MEDICAL CARE IF:   You have weakness or numbness in one of your legs or feet.  You have trouble controlling your bladder or bowels.  You have nausea, vomiting,  abdominal pain, shortness of breath, or fainting. Document Released: 06/09/2004 Document Revised: 07/25/2011 Document Reviewed: 04/16/2011 [facility] Patient Information 2015 [place], [place]. This information is not intended to replace advice given to you by your health care provider. Make sure you discuss any questions you have with your health care provider.

## 2014-11-03 NOTE — Progress Notes (Signed)
Subjective:    Patient ID: [name], female    DOB: 01-Mar-1991, 24 y.o.   MRN: [id number]  HPI [name] a 24 year old female with a history of sickle cell anemia, HbSS with chronic pain presents for a follow up of sickle cell anemia and medication management. [name] is also in 3rd trimester of pregnancy at 32 weeks. Patient was recently hospitalized with a right upper arm picc line infection with Enterobacter cloacae. She is currently being treated with IV antibiotics (outpatient) until 11/09/2014. She states that she is taking prenatal vitamins and folic acid consistently and feels well. She states that she is followed by obstetrician weekly.   [name] is currently complaining of 1-2/10 pain to lower extremities relieved by Oxycodone, last taken this am with maximum relief. Patient reports that she has daily pain intermittently described as aching.   [name] denies fatigue, headache, shortness of breath, chest pain, nausea, vomiting, or diarrhea.   Past Medical History  Diagnosis Date  . Miscarriage 03/22/2011    Pt reports 2 miscarriages.  . Depression 01/06/2011  . GERD (gastroesophageal reflux disease) 02/17/2011  . Trichotillomania     h/o  . Blood transfusion     "lots"  . Sickle cell anemia with crisis   . Exertional dyspnea     "sometimes"  . Migraines 11/08/11    "@ least twice/month"  . Chronic back pain     "very severe; have knot in my back; from tight muscle; take RX and exercise for it"  . Mood swings 11/08/11    "I go back and forth; real bad"  . Sickle cell anemia    History   Social History  . Marital Status: Single    Spouse Name: N/A  . Number of Children: N/A  . Years of Education: N/A   Occupational History  . Not on file.   Social History Main Topics  . Smoking status: Former Smoker -- 0.25 packs/day for 1 years    Types: Cigarettes    Quit date: 03/25/2013  . Smokeless tobacco: Never Used  . Alcohol Use: No     Comment:  pt states she quit marijuan in May 2013. Rare ETOH, + cigarettes.  She is enrolled in school  . Drug Use: No  . Sexual Activity: Yes    Birth Control/ Protection: None   Other Topics Concern  . 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   HENT: Negative.   Eyes: Negative.   Respiratory: Negative.   Cardiovascular: Negative.   Gastrointestinal: Negative.   Endocrine: Negative.   Genitourinary: Negative.   Musculoskeletal: Positive for myalgias (lower extremities).  Skin: Negative.   Allergic/Immunologic: Negative.   Neurological: Negative.   Hematological: Negative.   Psychiatric/Behavioral: Negative.        Objective:   Physical Exam  Constitutional: She is oriented to person, place, and time. She appears well-developed and well-nourished.  HENT:  Head: Normocephalic and atraumatic.  Right Ear: External ear normal.  Left Ear: External ear normal.  Mouth/Throat: Oropharynx is clear and moist.  Eyes: Conjunctivae and EOM are normal. Pupils are equal, round, and reactive to light.  Neck: Normal range of motion. Neck supple.  Cardiovascular: Normal rate, regular rhythm, normal heart sounds and intact distal pulses.  Pulmonary/Chest: Effort normal and breath sounds normal.  Abdominal: Soft. Bowel sounds are normal.  Musculoskeletal: Normal range of motion.  Neurological: She is alert and oriented to person, place, and time. She has normal reflexes.  Skin: Skin is warm and dry.  Psychiatric: She has a normal mood and affect. Her behavior is normal. Judgment and thought content normal.      BP 110/67 mmHg  Pulse 100  Temp(Src) 99.5 F (37.5 C) (Oral)  Resp 16  Ht 5\' 2"  (1.575 m)  Wt 171 lb (77.565 kg)  BMI 31.27 kg/m2  LMP 03/20/2014 Assessment & Plan:  1. Hb-SS disease without crisis Continue folic acid 1 mg daily  to prevent aplastic bone marrow crises and prenatal vitamin during 3rd trimester of pregnancy. Reviewed previous laboratory results, no proteinuria identified.   - oxyCODONE (ROXICODONE) 15 MG immediate release tablet; Take 1 tablet (15 mg total) by mouth every 4 (four) hours as needed for pain.  Dispense: 90 tablet; Refill: 0  2. Chronic pain Acute and chronic painful episodes - We agreed on continuing Methadone 5 mg daily and Oxycodone 15 mg every 4 hours as needed for moderate to severe pain # 90 tablets per month. Reviewed [place] Substance Reporting system prior to reorder, patient received a prescription on 10/30/2014 for 10 days of oxycodone following hospital discharge. Patient is high risk, so she will need to be evaluated monthly.   We discussed that pt is to receive her Schedule II prescriptions only from [place]. Pt is also aware that the prescription history is available to [place] online through the [facility] CSRS. Controlled substance agreement signed 11/03/2014. We reminded Ms. Leckey that all patients receiving Schedule II narcotics must be seen for follow within one month of prescription being requested. We reviewed the terms of our pain agreement, including the need to keep medicines in a safe locked location away from children or pets, and the need to report excess sedation or constipation, measures to avoid constipation, and policies related to early refills and stolen prescriptions. According to the [place] Chronic Pain Initiative program, we have reviewed details related to analgesia, adverse effects, aberrant behaviors.  - oxyCODONE (ROXICODONE) 15 MG immediate release tablet; Take 1 tablet (15 mg total) by mouth every 4 (four) hours as needed for pain.  Dispense: 90 tablet; Refill: 0  - Ambulatory referral to Pain Clinic  3. Third trimester pregnancy Patient reminded to follow up with obstetrician as scheduled      RTC: 1 month for sickle cell anemia and pain management    Dorena Dew, FNP

## 2014-11-03 NOTE — Procedures (Signed)
Associated Diagnosis: Bacteremia associated with intravascular line  MD: Zigmund Daniel  Procedure Note: PICC line accessed, flushed, blood return noted, antibiotic infused, line flushed and heparinized  Condition during procedure: Tolerated well Condition after procedure: Alert, oriented, and ambulatory

## 2014-11-04 ENCOUNTER — Ambulatory Visit (HOSPITAL_COMMUNITY)
Admission: RE | Admit: 2014-11-04 | Discharge: 2014-11-04 | Disposition: A | Discharge status: Home / Self Care | Payer: Medicare Other | Source: Ambulatory Visit | Attending: Family Medicine | Admitting: Family Medicine

## 2014-11-04 DIAGNOSIS — R7881 Bacteremia: Secondary | ICD-10-CM | POA: Diagnosis not present

## 2014-11-04 DIAGNOSIS — Z0183 Encounter for blood typing: Secondary | ICD-10-CM | POA: Diagnosis not present

## 2014-11-04 MED ORDER — SODIUM CHLORIDE 0.9 % IJ SOLN
10.0000 mL | INTRAMUSCULAR | Status: AC | PRN
  Administered 2014-11-04: 10 mL

## 2014-11-04 MED ORDER — HEPARIN SOD (PORK) LOCK FLUSH 100 UNIT/ML IV SOLN
500.0000 [IU] | INTRAVENOUS | Status: AC | PRN
  Administered 2014-11-04: 500 [IU]
  Filled 2014-11-04: qty 5

## 2014-11-04 MED ORDER — CEFTRIAXONE SODIUM IN DEXTROSE 40 MG/ML IV SOLN
2.0000 g | Freq: Once | INTRAVENOUS | Status: AC
  Administered 2014-11-04: 2 g via INTRAVENOUS
  Filled 2014-11-04: qty 50

## 2014-11-04 NOTE — Procedures (Signed)
[facility]  Procedure Note  NOGA FOGG IPP:[id number] DOB: March 13, 1991 DOA: 11/04/2014   Cammie Sickle, FNP   Associated Diagnosis: Bacteremia via intravenous line  Procedure Note: PICC line flushed, blood return noted, IV antibiotics infused per order, PICC flushed per protocol.  Dressing changed   Condition During Procedure:  Stable, denies any discomforts   Condition at Discharge: Stable, ambulatory at discharge.   Roberto Scales, RN  [facility]

## 2014-11-05 ENCOUNTER — Ambulatory Visit (HOSPITAL_COMMUNITY)
Admission: RE | Admit: 2014-11-05 | Discharge: 2014-11-05 | Disposition: A | Discharge status: Home / Self Care | Payer: Medicare Other | Source: Ambulatory Visit | Attending: Family Medicine | Admitting: Family Medicine

## 2014-11-05 DIAGNOSIS — R7881 Bacteremia: Secondary | ICD-10-CM | POA: Diagnosis not present

## 2014-11-05 DIAGNOSIS — Z0183 Encounter for blood typing: Secondary | ICD-10-CM | POA: Diagnosis not present

## 2014-11-05 MED ORDER — CEFTRIAXONE SODIUM IN DEXTROSE 40 MG/ML IV SOLN
2.0000 g | Freq: Once | INTRAVENOUS | Status: AC
  Administered 2014-11-05: 2 g via INTRAVENOUS
  Filled 2014-11-05: qty 50

## 2014-11-05 MED ORDER — SODIUM CHLORIDE 0.9 % IJ SOLN
10.0000 mL | INTRAMUSCULAR | Status: AC | PRN
  Administered 2014-11-05: 10 mL

## 2014-11-05 MED ORDER — HEPARIN SOD (PORK) LOCK FLUSH 100 UNIT/ML IV SOLN
250.0000 [IU] | INTRAVENOUS | Status: AC | PRN
  Administered 2014-11-05: 250 [IU]
  Filled 2014-11-05: qty 5

## 2014-11-05 NOTE — Procedures (Signed)
Diagnosis: Bacteremia via intravenous line MD: [name]  Procedure Note: Line flushed, blood return noted, IV antibiotics infused, PICC line flushed per protocol  Condition during procedure: Tolerated well Condition after procedure: Alert, oriented, and ambulatory

## 2014-11-06 ENCOUNTER — Ambulatory Visit (HOSPITAL_COMMUNITY)
Admission: RE | Admit: 2014-11-06 | Discharge: 2014-11-06 | Disposition: A | Discharge status: Home / Self Care | Payer: Medicare Other | Source: Ambulatory Visit | Attending: Family Medicine | Admitting: Family Medicine

## 2014-11-06 DIAGNOSIS — Z0183 Encounter for blood typing: Secondary | ICD-10-CM | POA: Diagnosis not present

## 2014-11-06 DIAGNOSIS — R7881 Bacteremia: Secondary | ICD-10-CM | POA: Diagnosis not present

## 2014-11-06 MED ORDER — SODIUM CHLORIDE 0.9 % IJ SOLN
10.0000 mL | INTRAMUSCULAR | Status: AC | PRN
  Administered 2014-11-06: 10 mL

## 2014-11-06 MED ORDER — CEFTRIAXONE SODIUM IN DEXTROSE 40 MG/ML IV SOLN
2.0000 g | Freq: Once | INTRAVENOUS | Status: AC
  Administered 2014-11-06: 2 g via INTRAVENOUS
  Filled 2014-11-06 (×2): qty 50

## 2014-11-06 MED ORDER — HEPARIN SOD (PORK) LOCK FLUSH 100 UNIT/ML IV SOLN
250.0000 [IU] | INTRAVENOUS | Status: AC | PRN
  Administered 2014-11-06: 250 [IU]
  Filled 2014-11-06: qty 5

## 2014-11-06 NOTE — Procedures (Signed)
Diagnosis: Bacteremia via intravenous line MD: [name]  Procedure Note: PICC line flushed, blood return noted, IV antibiotic infused, IV flushed per protocol  Condition during procedure: Tolerated well Condition after procedure: Alert, oriented, and ambulatory

## 2014-11-07 ENCOUNTER — Ambulatory Visit (HOSPITAL_COMMUNITY)
Admission: RE | Admit: 2014-11-07 | Discharge: 2014-11-07 | Disposition: A | Discharge status: Home / Self Care | Payer: Medicare Other | Source: Ambulatory Visit | Attending: Family Medicine | Admitting: Family Medicine

## 2014-11-07 DIAGNOSIS — R7881 Bacteremia: Secondary | ICD-10-CM | POA: Diagnosis not present

## 2014-11-07 DIAGNOSIS — Z0183 Encounter for blood typing: Secondary | ICD-10-CM | POA: Diagnosis not present

## 2014-11-07 MED ORDER — HEPARIN SOD (PORK) LOCK FLUSH 100 UNIT/ML IV SOLN
250.0000 [IU] | INTRAVENOUS | Status: AC | PRN
  Administered 2014-11-07: 250 [IU]
  Filled 2014-11-07: qty 5

## 2014-11-07 MED ORDER — CEFTRIAXONE SODIUM IN DEXTROSE 40 MG/ML IV SOLN
2.0000 g | Freq: Once | INTRAVENOUS | Status: AC
  Administered 2014-11-07: 2 g via INTRAVENOUS
  Filled 2014-11-07: qty 50

## 2014-11-07 MED ORDER — SODIUM CHLORIDE 0.9 % IJ SOLN
10.0000 mL | INTRAMUSCULAR | Status: AC | PRN
  Administered 2014-11-07: 10 mL

## 2014-11-07 NOTE — Procedures (Signed)
[facility]  Procedure Note  [name] GKK:[id number] DOB: [birth year] DOA: 11/07/2014   PCP: MATTHEWS,MICHELLE A., MD   Associated Diagnosis: Bacteremia via intravenous line  Procedure Note: IV infusion of rocephin   Condition During Procedure:  Pt tolerated well   Condition at Discharge:  No complications noted   Nigel Sloop, [facility]

## 2014-11-08 ENCOUNTER — Encounter (HOSPITAL_COMMUNITY)
Admission: RE | Admit: 2014-11-08 | Discharge: 2014-11-08 | Disposition: A | Discharge status: Home / Self Care | Payer: Medicare Other | Source: Ambulatory Visit | Attending: Internal Medicine | Admitting: Internal Medicine

## 2014-11-08 DIAGNOSIS — R7881 Bacteremia: Secondary | ICD-10-CM | POA: Diagnosis not present

## 2014-11-08 MED ORDER — HEPARIN SOD (PORK) LOCK FLUSH 100 UNIT/ML IV SOLN
250.0000 [IU] | INTRAVENOUS | Status: AC | PRN
Start: 2014-11-08 — End: 2014-11-08
  Administered 2014-11-08: 250 [IU]

## 2014-11-08 MED ORDER — DEXTROSE 5 % IV SOLN
2.0000 g | INTRAVENOUS | Status: DC
  Administered 2014-11-08: 2 g via INTRAVENOUS
  Filled 2014-11-08 (×2): qty 2

## 2014-11-08 NOTE — Progress Notes (Addendum)
Today's tx completed, PICC deaccessed & flushed by iv team, pt discharged. Lonni Dirden, [employer]

## 2014-11-08 NOTE — Progress Notes (Signed)
Pt to 1526 for iv abx as outpt. VS obtained & PICC accessed. Letrell Attwood, [employer]

## 2014-11-09 ENCOUNTER — Encounter (HOSPITAL_COMMUNITY): Payer: Medicare Other

## 2014-11-11 ENCOUNTER — Encounter: Payer: Self-pay | Admitting: General Practice

## 2014-11-14 ENCOUNTER — Ambulatory Visit (HOSPITAL_COMMUNITY)
Admission: RE | Admit: 2014-11-14 | Discharge: 2014-11-14 | Disposition: A | Discharge status: Home / Self Care | Payer: Medicare Other | Source: Ambulatory Visit | Attending: Internal Medicine | Admitting: Internal Medicine

## 2014-11-14 ENCOUNTER — Encounter (HOSPITAL_COMMUNITY): Payer: Self-pay

## 2014-11-14 DIAGNOSIS — O2690 Pregnancy related conditions, unspecified, unspecified trimester: Secondary | ICD-10-CM | POA: Insufficient documentation

## 2014-11-14 DIAGNOSIS — O99323 Drug use complicating pregnancy, third trimester: Secondary | ICD-10-CM | POA: Insufficient documentation

## 2014-11-14 DIAGNOSIS — D5700 Hb-SS disease with crisis, unspecified: Secondary | ICD-10-CM

## 2014-11-14 DIAGNOSIS — Z3A34 34 weeks gestation of pregnancy: Secondary | ICD-10-CM | POA: Diagnosis not present

## 2014-11-14 DIAGNOSIS — D571 Sickle-cell disease without crisis: Secondary | ICD-10-CM | POA: Diagnosis not present

## 2014-11-14 DIAGNOSIS — O99013 Anemia complicating pregnancy, third trimester: Secondary | ICD-10-CM | POA: Diagnosis not present

## 2014-11-14 DIAGNOSIS — F1110 Opioid abuse, uncomplicated: Secondary | ICD-10-CM | POA: Diagnosis not present

## 2014-11-18 ENCOUNTER — Inpatient Hospital Stay (HOSPITAL_COMMUNITY)
Admission: AD | Admit: 2014-11-18 | Discharge: 2014-11-21 | DRG: 781 | Disposition: A | Discharge status: Home / Self Care | Payer: Medicare Other | Source: Ambulatory Visit | Attending: Family Medicine | Admitting: Family Medicine

## 2014-11-18 ENCOUNTER — Encounter (HOSPITAL_COMMUNITY): Payer: Self-pay

## 2014-11-18 DIAGNOSIS — Z91018 Allergy to other foods: Secondary | ICD-10-CM

## 2014-11-18 DIAGNOSIS — O09293 Supervision of pregnancy with other poor reproductive or obstetric history, third trimester: Secondary | ICD-10-CM | POA: Diagnosis not present

## 2014-11-18 DIAGNOSIS — D5700 Hb-SS disease with crisis, unspecified: Secondary | ICD-10-CM | POA: Diagnosis not present

## 2014-11-18 DIAGNOSIS — K219 Gastro-esophageal reflux disease without esophagitis: Secondary | ICD-10-CM | POA: Diagnosis present

## 2014-11-18 DIAGNOSIS — O99013 Anemia complicating pregnancy, third trimester: Principal | ICD-10-CM | POA: Diagnosis present

## 2014-11-18 DIAGNOSIS — Z3A34 34 weeks gestation of pregnancy: Secondary | ICD-10-CM | POA: Diagnosis present

## 2014-11-18 DIAGNOSIS — M549 Dorsalgia, unspecified: Secondary | ICD-10-CM | POA: Diagnosis present

## 2014-11-18 DIAGNOSIS — Z9049 Acquired absence of other specified parts of digestive tract: Secondary | ICD-10-CM | POA: Diagnosis present

## 2014-11-18 DIAGNOSIS — O99343 Other mental disorders complicating pregnancy, third trimester: Secondary | ICD-10-CM | POA: Diagnosis present

## 2014-11-18 DIAGNOSIS — Z79899 Other long term (current) drug therapy: Secondary | ICD-10-CM

## 2014-11-18 DIAGNOSIS — O9989 Other specified diseases and conditions complicating pregnancy, childbirth and the puerperium: Secondary | ICD-10-CM | POA: Diagnosis present

## 2014-11-18 DIAGNOSIS — Z91040 Latex allergy status: Secondary | ICD-10-CM

## 2014-11-18 DIAGNOSIS — O99613 Diseases of the digestive system complicating pregnancy, third trimester: Secondary | ICD-10-CM | POA: Diagnosis present

## 2014-11-18 DIAGNOSIS — Z87891 Personal history of nicotine dependence: Secondary | ICD-10-CM

## 2014-11-18 DIAGNOSIS — F329 Major depressive disorder, single episode, unspecified: Secondary | ICD-10-CM | POA: Diagnosis present

## 2014-11-18 DIAGNOSIS — D72829 Elevated white blood cell count, unspecified: Secondary | ICD-10-CM

## 2014-11-18 DIAGNOSIS — O99353 Diseases of the nervous system complicating pregnancy, third trimester: Secondary | ICD-10-CM | POA: Diagnosis not present

## 2014-11-18 DIAGNOSIS — G43909 Migraine, unspecified, not intractable, without status migrainosus: Secondary | ICD-10-CM | POA: Diagnosis present

## 2014-11-18 LAB — CBC
HEMATOCRIT: 27.2 % — AB (ref 36.0–46.0)
HEMOGLOBIN: 9.7 g/dL — AB (ref 12.0–15.0)
MCH: 30.5 pg (ref 26.0–34.0)
MCHC: 35.7 g/dL (ref 30.0–36.0)
MCV: 85.5 fL (ref 78.0–100.0)
Platelets: 377 10*3/uL (ref 150–400)
RBC: 3.18 MIL/uL — ABNORMAL LOW (ref 3.87–5.11)
RDW: 19.8 % — ABNORMAL HIGH (ref 11.5–15.5)
WBC: 18.5 10*3/uL — ABNORMAL HIGH (ref 4.0–10.5)

## 2014-11-18 MED ORDER — HYDROMORPHONE HCL 2 MG/ML IJ SOLN
2.0000 mg | Freq: Once | INTRAMUSCULAR | Status: AC
  Administered 2014-11-18: 2 mg via INTRAVENOUS
  Filled 2014-11-18: qty 1

## 2014-11-18 MED ORDER — OXYCODONE-ACETAMINOPHEN 5-325 MG PO TABS
2.0000 | ORAL_TABLET | Freq: Once | ORAL | Status: DC
  Filled 2014-11-18: qty 2

## 2014-11-18 MED ORDER — DIPHENHYDRAMINE HCL 25 MG PO CAPS
25.0000 mg | ORAL_CAPSULE | Freq: Once | ORAL | Status: AC
  Administered 2014-11-18: 25 mg via ORAL
  Filled 2014-11-18: qty 1

## 2014-11-18 MED ORDER — CALCIUM CARBONATE ANTACID 500 MG PO CHEW
1.0000 | CHEWABLE_TABLET | Freq: Once | ORAL | Status: AC
  Administered 2014-11-18: 200 mg via ORAL
  Filled 2014-11-18: qty 1

## 2014-11-18 MED ORDER — SODIUM CHLORIDE 0.9 % IV SOLN
Freq: Once | INTRAVENOUS | Status: AC
  Administered 2014-11-18: 100 mL/h via INTRAVENOUS

## 2014-11-18 NOTE — MAU Note (Signed)
Pt reports she had contractions last night and that stopped but today she began having sickle cell pain in her left knee. States she did not want to "self prescribe" using her meds at home without being seen. Some lower back pain that she attributes to the pregnancy.

## 2014-11-18 NOTE — MAU Provider Note (Signed)
History     CSN: [id number]  Arrival date and time: 11/18/14 6568   First Provider Initiated Contact with Patient 11/18/14 2119      Chief Complaint  Patient presents with  . Knee Pain   HPI  [name] is a 24 y.o. G2P0010 at [redacted]w[redacted]d here with report of left knee pain that started at 0800 this morning.  Pain is described as achy, constant and rated 7/10.  Also reports edema of the knee.  Pt feels related to sickle cell.  Reports difficulty with ambulation.    Past Medical History  Diagnosis Date  . Miscarriage 03/22/2011    Pt reports 2 miscarriages.  . Depression 01/06/2011  . GERD (gastroesophageal reflux disease) 02/17/2011  . Trichotillomania     h/o  . Blood transfusion     "lots"  . Sickle cell anemia with crisis   . Exertional dyspnea     "sometimes"  . Migraines 11/08/11    "@ least twice/month"  . Chronic back pain     "very severe; have knot in my back; from tight muscle; take RX and exercise for it"  . Mood swings 11/08/11    "I go back and forth; real bad"  . Sickle cell anemia     Past Surgical History  Procedure Laterality Date  . Cholecystectomy  05/2010  . Dilation and curettage of uterus  02/20/11    S/P miscarriage    Family History  Problem Relation Age of Onset  . Alcoholism    . Depression    . Hypercholesterolemia    . Hypertension    . Migraines    . Diabetes Maternal Grandmother   . Diabetes Paternal Grandmother   . Diabetes Maternal Grandfather   . Sickle cell trait Mother   . Sickle cell trait Father     History  Substance Use Topics  . Smoking status: Former Smoker -- 0.25 packs/day for 1 years    Types: Cigarettes    Quit date: 03/25/2013  . Smokeless tobacco: Never Used  . Alcohol Use: No     Comment: pt states she quit marijuan in May 2013. Rare ETOH, + cigarettes.  She is enrolled in school    Allergies:  Allergies  Allergen Reactions  . Carrot [Daucus Carota] Hives, Swelling and Rash    Dietary:  Please do  not send any form of carrot to patient  . Carrot Oil Hives and Swelling  . Latex Rash    Facility-administered medications prior to admission  Medication Dose Route Frequency Provider Last Rate Last Dose  . 0.9 %  sodium chloride infusion   Intravenous Once [name], MD       Prescriptions prior to admission  Medication Sig Dispense Refill Last Dose  . cyclobenzaprine (FLEXERIL) 10 MG tablet Take 1 tablet (10 mg total) by mouth 3 (three) times daily as needed for muscle spasms. 30 tablet 5 Past Month at Unknown time  . docusate sodium (COLACE) 100 MG capsule Take 1 capsule (100 mg total) by mouth daily. (Patient taking differently: Take 100 mg by mouth daily as needed for mild constipation. ) 30 capsule prn Past Month at Unknown time  . folic acid (FOLVITE) 1 MG tablet Take 2 tablets (2 mg total) by mouth daily. (Patient taking differently: Take 1 mg by mouth daily. ) 60 tablet 2 11/18/2014 at Unknown time  . methadone (DOLOPHINE) 5 MG tablet Take 1 tablet (5 mg total) by mouth at bedtime. 30 tablet  0 11/18/2014 at Unknown time  . oxyCODONE (ROXICODONE) 15 MG immediate release tablet Take 1 tablet (15 mg total) by mouth every 4 (four) hours as needed for pain. 90 tablet 0 11/18/2014 at 1700  . Prenatal Vit-Fe Fumarate-FA (PRENATAL MULTIVITAMIN) TABS tablet Take 1 tablet by mouth daily at 12 noon.   Past Week at Unknown time  . methadone (DOLOPHINE) 5 MG tablet Take 1 tablet (5 mg total) by mouth at bedtime. (Patient not taking: Reported on 11/18/2014) 30 tablet 0 Not Taking at Unknown time    Review of Systems  Constitutional: Negative for fever and chills.  Cardiovascular: Negative for chest pain.  Gastrointestinal: Positive for abdominal pain (lower pelvic, crampy).  Musculoskeletal: Positive for joint pain (left knee). Negative for back pain.  Neurological: Negative for headaches.  All other systems reviewed and are negative.  Physical Exam   Blood pressure 119/72, pulse 96,  temperature 99 F (37.2 C), temperature source Oral, resp. rate 16, height 5\' 1"  (1.549 m), weight 75.751 kg (167 lb), last menstrual period 03/20/2014, SpO2 100 %.  Physical Exam  Constitutional: She is oriented to person, place, and time. She appears well-developed and well-nourished.  HENT:  Head: Normocephalic.  Neck: Normal range of motion. Neck supple.  Cardiovascular: Normal rate, regular rhythm and normal heart sounds.   Respiratory: Effort normal and breath sounds normal.  Genitourinary: No bleeding in the vagina.  Musculoskeletal:       Left knee: She exhibits swelling.  Left knee tender with palpation  Neurological: She is alert and oriented to person, place, and time.  Skin: Skin is warm and dry.   [place] assumed by Fleet Contras, [facility] MAU Course  Procedures  Care assumed by me  Medical, Surgical, Family and Social histories reviewed and are listed above.  Medications and allergies reviewed.  Exam:  Agree with above.  Left knee is a common presentation for crisis pain for her. Her left knee is tender, with slight swelling             Has PICC line in right upper arm, infusing well Consulted Dr Nehemiah Settle.  Patient states she usually gets Dilaudid every 20 min x 3 then is usually able to go home    Will do that and infuse fluids Results for orders placed or performed during the hospital encounter of 11/18/14 (from the past 24 hour(s))  CBC     Status: Abnormal   Collection Time: 11/18/14  9:48 PM  Result Value Ref Range   WBC 18.5 (H) 4.0 - 10.5 K/uL   RBC 3.18 (L) 3.87 - 5.11 MIL/uL   Hemoglobin 9.7 (L) 12.0 - 15.0 g/dL   HCT 27.2 (L) 36.0 - 46.0 %   MCV 85.5 78.0 - 100.0 fL   MCH 30.5 26.0 - 34.0 pg   MCHC 35.7 30.0 - 36.0 g/dL   RDW 19.8 (H) 11.5 - 15.5 %   Platelets 377 150 - 400 K/uL   2310 hrs:  Has had two doses. Pain is down to a "6".  Requested Benadryl for itching.  2320 hrs:  Still no relief, will give 3rd dose per consult Dr  Nehemiah Settle 0030 hrs:  4th dose authorized per Dr Nehemiah Settle He states if she does not get good relief from that, we will admit and start her on PCA as per previous admission as follows:  Dilaudid PCA is started at the following settings: Bolus 0.7 mg, lockout 10 minutes, limit 4.2 mg one hour  She states she did not get much relief  Assessment and Plan   A;  SIUP at [redacted]w[redacted]d        Sickle cell crisis         P:  Admit to Womens Unit       IV hydration       PCA Dilaudid       NST q shift       Consult SIckle Cell Team tomorrow

## 2014-11-19 ENCOUNTER — Encounter (HOSPITAL_COMMUNITY): Payer: Self-pay | Admitting: *Deleted

## 2014-11-19 ENCOUNTER — Telehealth: Payer: Self-pay | Admitting: Family Medicine

## 2014-11-19 DIAGNOSIS — O09293 Supervision of pregnancy with other poor reproductive or obstetric history, third trimester: Secondary | ICD-10-CM | POA: Diagnosis not present

## 2014-11-19 DIAGNOSIS — D5700 Hb-SS disease with crisis, unspecified: Secondary | ICD-10-CM | POA: Diagnosis not present

## 2014-11-19 DIAGNOSIS — Z91018 Allergy to other foods: Secondary | ICD-10-CM | POA: Diagnosis not present

## 2014-11-19 DIAGNOSIS — Z9049 Acquired absence of other specified parts of digestive tract: Secondary | ICD-10-CM | POA: Diagnosis present

## 2014-11-19 DIAGNOSIS — F329 Major depressive disorder, single episode, unspecified: Secondary | ICD-10-CM | POA: Diagnosis present

## 2014-11-19 DIAGNOSIS — O99343 Other mental disorders complicating pregnancy, third trimester: Secondary | ICD-10-CM | POA: Diagnosis present

## 2014-11-19 DIAGNOSIS — O99013 Anemia complicating pregnancy, third trimester: Secondary | ICD-10-CM | POA: Diagnosis present

## 2014-11-19 DIAGNOSIS — Z91040 Latex allergy status: Secondary | ICD-10-CM | POA: Diagnosis not present

## 2014-11-19 DIAGNOSIS — O99353 Diseases of the nervous system complicating pregnancy, third trimester: Secondary | ICD-10-CM | POA: Diagnosis present

## 2014-11-19 DIAGNOSIS — M549 Dorsalgia, unspecified: Secondary | ICD-10-CM | POA: Diagnosis present

## 2014-11-19 DIAGNOSIS — Z3A34 34 weeks gestation of pregnancy: Secondary | ICD-10-CM | POA: Diagnosis present

## 2014-11-19 DIAGNOSIS — Z79899 Other long term (current) drug therapy: Secondary | ICD-10-CM | POA: Diagnosis not present

## 2014-11-19 DIAGNOSIS — Z87891 Personal history of nicotine dependence: Secondary | ICD-10-CM | POA: Diagnosis not present

## 2014-11-19 DIAGNOSIS — G43909 Migraine, unspecified, not intractable, without status migrainosus: Secondary | ICD-10-CM | POA: Diagnosis present

## 2014-11-19 DIAGNOSIS — O99613 Diseases of the digestive system complicating pregnancy, third trimester: Secondary | ICD-10-CM | POA: Diagnosis present

## 2014-11-19 DIAGNOSIS — K219 Gastro-esophageal reflux disease without esophagitis: Secondary | ICD-10-CM | POA: Diagnosis present

## 2014-11-19 DIAGNOSIS — D638 Anemia in other chronic diseases classified elsewhere: Secondary | ICD-10-CM | POA: Diagnosis not present

## 2014-11-19 DIAGNOSIS — O9989 Other specified diseases and conditions complicating pregnancy, childbirth and the puerperium: Secondary | ICD-10-CM | POA: Diagnosis present

## 2014-11-19 LAB — URINALYSIS, ROUTINE W REFLEX MICROSCOPIC
BILIRUBIN URINE: NEGATIVE
Glucose, UA: NEGATIVE mg/dL
HGB URINE DIPSTICK: NEGATIVE
Ketones, ur: NEGATIVE mg/dL
Nitrite: NEGATIVE
Protein, ur: NEGATIVE mg/dL
Specific Gravity, Urine: 1.01 (ref 1.005–1.030)
UROBILINOGEN UA: 1 mg/dL (ref 0.0–1.0)
pH: 6 (ref 5.0–8.0)

## 2014-11-19 LAB — URINE MICROSCOPIC-ADD ON

## 2014-11-19 LAB — CBC
HEMATOCRIT: 26 % — AB (ref 36.0–46.0)
Hemoglobin: 8.9 g/dL — ABNORMAL LOW (ref 12.0–15.0)
MCH: 29.6 pg (ref 26.0–34.0)
MCHC: 34.2 g/dL (ref 30.0–36.0)
MCV: 86.4 fL (ref 78.0–100.0)
Platelets: 334 10*3/uL (ref 150–400)
RBC: 3.01 MIL/uL — AB (ref 3.87–5.11)
RDW: 20.6 % — ABNORMAL HIGH (ref 11.5–15.5)
WBC: 16.7 10*3/uL — ABNORMAL HIGH (ref 4.0–10.5)

## 2014-11-19 MED ORDER — PRENATAL MULTIVITAMIN CH
1.0000 | ORAL_TABLET | Freq: Every day | ORAL | Status: DC
  Filled 2014-11-19 (×2): qty 1

## 2014-11-19 MED ORDER — ZOLPIDEM TARTRATE 5 MG PO TABS: 5.0000 mg | ORAL_TABLET | Freq: Every evening | ORAL | Status: DC | PRN

## 2014-11-19 MED ORDER — HYDROMORPHONE 0.3 MG/ML IV SOLN
INTRAVENOUS | Status: DC
  Administered 2014-11-19: 6.09 mg via INTRAVENOUS
  Administered 2014-11-19: 9 mg via INTRAVENOUS
  Administered 2014-11-19: 19.67 mL via INTRAVENOUS
  Administered 2014-11-19 (×3): via INTRAVENOUS
  Administered 2014-11-19: 9.52 mg via INTRAVENOUS
  Administered 2014-11-20: 0.75 mL via INTRAVENOUS
  Administered 2014-11-20: 1.5 mL via INTRAVENOUS
  Administered 2014-11-20 (×2): 7.2 mg via INTRAVENOUS
  Administered 2014-11-20: 2.1 mg via INTRAVENOUS
  Administered 2014-11-20: 9.32 mL via INTRAVENOUS
  Administered 2014-11-21: 1.5 mg via INTRAVENOUS
  Administered 2014-11-21: 2.1 mg via INTRAVENOUS
  Administered 2014-11-21: 1.65 mL via INTRAVENOUS
  Filled 2014-11-19 (×4): qty 25

## 2014-11-19 MED ORDER — DOCUSATE SODIUM 100 MG PO CAPS
100.0000 mg | ORAL_CAPSULE | Freq: Every day | ORAL | Status: DC
  Administered 2014-11-20 – 2014-11-21 (×2): 100 mg via ORAL
  Filled 2014-11-19 (×3): qty 1

## 2014-11-19 MED ORDER — LACTATED RINGERS IV SOLN
INTRAVENOUS | Status: DC
  Administered 2014-11-19 – 2014-11-21 (×7): via INTRAVENOUS

## 2014-11-19 MED ORDER — HYDROMORPHONE HCL 1 MG/ML IJ SOLN
2.0000 mg | Freq: Once | INTRAMUSCULAR | Status: AC
  Administered 2014-11-19: 2 mg via INTRAVENOUS
  Filled 2014-11-19: qty 2

## 2014-11-19 MED ORDER — COMPLETENATE 29-1 MG PO CHEW
1.0000 | CHEWABLE_TABLET | Freq: Every day | ORAL | Status: DC
  Administered 2014-11-20 – 2014-11-21 (×2): 1 via ORAL
  Filled 2014-11-19 (×4): qty 1

## 2014-11-19 MED ORDER — METHADONE HCL 5 MG PO TABS
5.0000 mg | ORAL_TABLET | Freq: Every day | ORAL | Status: DC
  Administered 2014-11-19 – 2014-11-20 (×2): 5 mg via ORAL
  Filled 2014-11-19 (×2): qty 1

## 2014-11-19 MED ORDER — HYDROMORPHONE HCL 1 MG/ML IJ SOLN: 2.0000 mg | INTRAMUSCULAR | Status: DC

## 2014-11-19 MED ORDER — HYDROMORPHONE HCL 2 MG/ML IJ SOLN
2.0000 mg | INTRAMUSCULAR | Status: AC
  Administered 2014-11-19 – 2014-11-20 (×7): 2 mg via INTRAVENOUS
  Filled 2014-11-19 (×7): qty 1

## 2014-11-19 MED ORDER — HYDROMORPHONE HCL 1 MG/ML IJ SOLN: 2.0000 mg | INTRAMUSCULAR | Status: DC | PRN

## 2014-11-19 MED ORDER — DIPHENHYDRAMINE HCL 50 MG/ML IJ SOLN
12.5000 mg | Freq: Four times a day (QID) | INTRAMUSCULAR | Status: DC | PRN
Start: 2014-11-19 — End: 2014-11-21
  Administered 2014-11-19 – 2014-11-21 (×8): 12.5 mg via INTRAVENOUS
  Filled 2014-11-19 (×8): qty 1

## 2014-11-19 MED ORDER — HYDROMORPHONE HCL 2 MG/ML IJ SOLN
2.0000 mg | INTRAMUSCULAR | Status: DC | PRN
  Administered 2014-11-20 – 2014-11-21 (×7): 2 mg via INTRAVENOUS
  Filled 2014-11-19 (×7): qty 1

## 2014-11-19 MED ORDER — CALCIUM CARBONATE ANTACID 500 MG PO CHEW
2.0000 | CHEWABLE_TABLET | ORAL | Status: DC | PRN
  Administered 2014-11-19 – 2014-11-20 (×2): 400 mg via ORAL
  Filled 2014-11-19 (×3): qty 2

## 2014-11-19 MED ORDER — ACETAMINOPHEN 325 MG PO TABS: 650.0000 mg | ORAL_TABLET | ORAL | Status: DC | PRN

## 2014-11-19 NOTE — Progress Notes (Signed)
Called to room to change dressing on PICC line located in pts right upper, inner arm.  Dressing noted to be peeling off and dirty, film on dressing noted to be almost black.  Removed old dressing using sterile technique and cleaned area thoroughly prior to replacing dressing.  No redness, tenderness or irritation noted at site.  Site clean and dry. Pt did state area dry and itching.  Antimicrobial ring placed at site, line anchored, new sterile dressing applied.  Instructed not to pick at dressing.

## 2014-11-19 NOTE — Progress Notes (Signed)
CSW acknowledges consult for "Sickle cell patient with case worker at home."  CSW attempted to meet with patient to complete assessment and offer support, but she was being transferred to [facility] Unit.  CSW will attempt again once patient is settled into new room.

## 2014-11-19 NOTE — Telephone Encounter (Signed)
Refill request for oxycodone 15mg . LOV 11/03/2014. Please advise. Thanks!

## 2014-11-19 NOTE — Consult Note (Signed)
[facility] Medical Consultation  [name] UXN:[id number] DOB: Apr 18, 1991 DOA: 11/18/2014 PCP: [name]., MD   Requesting physician: [name] Date of consultation: 11/19/2014 Reason for consultation: Management of Sickle Cell Crisis  Impression/Recommendations Active Problems:   Sickle cell crisis    Hb SS with crisis: I would resume her Methadone at HS, continue PCA at current dosing and add clinician assisted doses scheduled for the 1st 24 hours then PRN and re-assess need for continued scheduled doses. If okay with Ob-Gyn as patient in 3rd Trimester, I would consider Toradol 15 mg IV q 6 hours. Continue IVF but would change to D5.45 at 100 ml/hr  from a Sickle Cell perspective. Add heating pad and Incentive Spirometer to decrease chances of atelectasis and Acute Chest Syndrome.   Anemia Of Chronic Disease: Pt's last Hb electrophoresis shows Hb S % at 49.4 approximately 1 month ago. Will check electrophoresis and if Hb S >30%, recommend that a partial exchange transfusion be performed.  I will followup again tomorrow. Please contact me if I can be of assistance in the meanwhile. Thank you for this consultation.  Chief Complaint: Pain in legs x 2 days  HPI: at [redacted]w[redacted]d Opiate tolerant G2P0010 patient at [name] and with Hb SS was admitted to [facility] with c/o pain at an intensity of 10/10 and localized to legs and back. Pt states that she tried to manage pain with increased fluids and oral analgesics and was unsuccessful (of note I had no contact with the patient prior to her presentation to the hospital) so she presented to the ED for assistance with pain management. Currently her pain is still 9/10 and localized to her LE's especially B/L knees. Thus she is unable to ambulate. She describes the pain as throbbing and non-radiating and not associated with any other symptoms.  Review of Systems:  All other systems normal except as noted in the HPI.  Past  Medical History  Diagnosis Date  . Miscarriage 03/22/2011    Pt reports 2 miscarriages.  . Depression 01/06/2011  . GERD (gastroesophageal reflux disease) 02/17/2011  . Trichotillomania     h/o  . Blood transfusion     "lots"  . Sickle cell anemia with crisis   . Exertional dyspnea     "sometimes"  . Migraines 11/08/11    "@ least twice/month"  . Chronic back pain     "very severe; have knot in my back; from tight muscle; take RX and exercise for it"  . Mood swings 11/08/11    "I go back and forth; real bad"  . Sickle cell anemia    Past Surgical History  Procedure Laterality Date  . Cholecystectomy  05/2010  . Dilation and curettage of uterus  02/20/11    S/P miscarriage   Social History:  reports that she quit smoking about 19 months ago. Her smoking use included Cigarettes. She has a .25 pack-year smoking history. She has never used smokeless tobacco. She reports that she does not drink alcohol or use illicit drugs.  Allergies  Allergen Reactions  . Carrot [Daucus Carota] Hives, Swelling and Rash    Dietary:  Please do not send any form of carrot to patient  . Carrot Oil Hives and Swelling  . Latex Rash   Family History  Problem Relation Age of Onset  . Alcoholism    . Depression    . Hypercholesterolemia    . Hypertension    . Migraines    . Diabetes Maternal Grandmother   .  Diabetes Paternal Grandmother   . Diabetes Maternal Grandfather   . Sickle cell trait Mother   . Sickle cell trait Father     Prior to Admission medications   Medication Sig Start Date End Date Taking? Authorizing Provider  cyclobenzaprine (FLEXERIL) 10 MG tablet Take 1 tablet (10 mg total) by mouth 3 (three) times daily as needed for muscle spasms. 10/30/14  Yes Osborne Oman, MD  docusate sodium (COLACE) 100 MG capsule Take 1 capsule (100 mg total) by mouth daily. Patient taking differently: Take 100 mg by mouth daily as needed for mild constipation.  10/14/14  Yes Jonnie Kind, MD  folic  acid (FOLVITE) 1 MG tablet Take 2 tablets (2 mg total) by mouth daily. Patient taking differently: Take 1 mg by mouth daily.  04/24/14  Yes Dorena Dew, FNP  methadone (DOLOPHINE) 5 MG tablet Take 1 tablet (5 mg total) by mouth at bedtime. 10/30/14  Yes Osborne Oman, MD  oxyCODONE (ROXICODONE) 15 MG immediate release tablet Take 1 tablet (15 mg total) by mouth every 4 (four) hours as needed for pain. 11/03/14  Yes Dorena Dew, FNP  Prenatal Vit-Fe Fumarate-FA (PRENATAL MULTIVITAMIN) TABS tablet Take 1 tablet by mouth daily at 12 noon.   Yes Historical Provider, MD  methadone (DOLOPHINE) 5 MG tablet Take 1 tablet (5 mg total) by mouth at bedtime. Patient not taking: Reported on 11/18/2014 10/30/14   [name], MD   Physical Exam: Blood pressure 107/64, pulse 94, temperature 98.4 F (36.9 C), temperature source Oral, resp. rate 16, height 5\' 1"  (1.549 m), weight 167 lb 3.2 oz (75.841 kg), last menstrual period 03/20/2014, SpO2 96 %. Filed Vitals:   11/19/14 1222 11/19/14 1228 11/19/14 1255 11/19/14 1346  BP:  105/54  107/64  Pulse:  103 102 94  Temp:  97.9 F (36.6 C)  98.4 F (36.9 C)  TempSrc:  Oral  Oral  Resp: 16 16  16   Height:      Weight:      SpO2: 96%  96%    General: Alert, awake, oriented x3, in mild distress due to pain.  HEENT: [place]/AT PEERL, EOMI, anicteric Neck: Trachea midline, no masses, no thyromegal,y no JVD, no carotid bruit OROPHARYNX: Moist, No exudate/ erythema/lesions.  Heart: Regular rate and rhythm, without murmurs, rubs, gallops or S3. PMI non-displaced. Exam reveals no decreased pulses. Pulmonary/Chest: Normal effort. Breath sounds normal. No. Apnea. Clear to auscultation,no stridor,  no wheezing and no rhonchi noted. No respiratory distress and no tenderness noted. Abdomen: Gravid. Non-tender. Neuro: Alert and oriented to person, place and time. Normal motor skills, Displays no atrophy or tremors and exhibits normal muscle tone.  No focal  neurological deficits noted cranial nerves II through XII grossly intact. No sensory deficit noted. DTRs 2+ bilaterally upper and lower extremities. Strength at baseline in bilateral upper and lower extremities. Gait not assessed. Musculoskeletal: No warm swelling or erythema around joints, no spinal tenderness noted. Psychiatric: Patient alert and oriented x3, good insight and cognition, good recent to remote recall. Skin: Skin is warm and dry. No bruising, no ecchymosis and no rash noted. Pt is not diaphoretic. No erythema. No pallor     Labs on Admission:  Basic Metabolic Panel: No results for input(s): NA, K, CL, CO2, GLUCOSE, BUN, CREATININE, CALCIUM, MG, PHOS in the last 168 hours. Liver Function Tests: No results for input(s): AST, ALT, ALKPHOS, BILITOT, PROT, ALBUMIN in the last 168 hours. No results for input(s): LIPASE,  AMYLASE in the last 168 hours. No results for input(s): AMMONIA in the last 168 hours. CBC:  Recent Labs Lab 11/18/14 2148 11/19/14 0535  WBC 18.5* 16.7*  HGB 9.7* 8.9*  HCT 27.2* 26.0*  MCV 85.5 86.4  PLT 377 334   Cardiac Enzymes: No results for input(s): CKTOTAL, CKMB, CKMBINDEX, TROPONINI in the last 168 hours. BNP: Invalid input(s): POCBNP CBG: No results for input(s): GLUCAP in the last 168 hours.  Radiological Exams on Admission: No results found.    Time spent: 36 minutes  Theone Bowell A. Sickle Cell Service Pager [phone]  If 7PM-7AM, please contact night-coverage [email or web address] Password [email or web address] 11/19/2014, 2:20 PM

## 2014-11-19 NOTE — H&P (Signed)
History     CSN: [id number]  Arrival date and time: 11/18/14 3220  First Provider Initiated Contact with Patient 11/18/14 2119        Chief Complaint  Patient presents with  . Knee Pain   HPI  [name] is a 24 y.o. G2P0010 at [redacted]w[redacted]d here with report of left knee pain that started at 0800 this morning. Pain is described as achy, constant and rated 7/10. Also reports edema of the knee. Pt feels related to sickle cell. Reports difficulty with ambulation.   Past Medical History  Diagnosis Date  . Miscarriage 03/22/2011    Pt reports 2 miscarriages.  . Depression 01/06/2011  . GERD (gastroesophageal reflux disease) 02/17/2011  . Trichotillomania     h/o  . Blood transfusion     "lots"  . Sickle cell anemia with crisis   . Exertional dyspnea     "sometimes"  . Migraines 11/08/11    "@ least twice/month"  . Chronic back pain     "very severe; have knot in my back; from tight muscle; take RX and exercise for it"  . Mood swings 11/08/11    "I go back and forth; real bad"  . Sickle cell anemia     Past Surgical History  Procedure Laterality Date  . Cholecystectomy  05/2010  . Dilation and curettage of uterus  02/20/11    S/P miscarriage    Family History  Problem Relation Age of Onset  . Alcoholism    . Depression    . Hypercholesterolemia    . Hypertension    . Migraines    . Diabetes Maternal Grandmother   . Diabetes Paternal Grandmother   . Diabetes Maternal Grandfather   . Sickle cell trait Mother   . Sickle cell trait Father     History  Substance Use Topics  . Smoking status: Former Smoker -- 0.25 packs/day for 1 years    Types: Cigarettes    Quit date: 03/25/2013  . Smokeless tobacco: Never Used  . Alcohol Use: No     Comment: pt states she quit marijuan in May 2013. Rare ETOH, +  cigarettes. She is enrolled in school    Allergies:  Allergies  Allergen Reactions  . Carrot [Daucus Carota] Hives, Swelling and Rash    Dietary: Please do not send any form of carrot to patient  . Carrot Oil Hives and Swelling  . Latex Rash    Facility-administered medications prior to admission  Medication Dose Route Frequency Provider Last Rate Last Dose  . 0.9 % sodium chloride infusion  Intravenous Once [name], MD     Prescriptions prior to admission  Medication Sig Dispense Refill Last Dose  . cyclobenzaprine (FLEXERIL) 10 MG tablet Take 1 tablet (10 mg total) by mouth 3 (three) times daily as needed for muscle spasms. 30 tablet 5 Past Month at Unknown time  . docusate sodium (COLACE) 100 MG capsule Take 1 capsule (100 mg total) by mouth daily. (Patient taking differently: Take 100 mg by mouth daily as needed for mild constipation. ) 30 capsule prn Past Month at Unknown time  . folic acid (FOLVITE) 1 MG tablet Take 2 tablets (2 mg total) by mouth daily. (Patient taking differently: Take 1 mg by mouth daily. ) 60 tablet 2 11/18/2014 at Unknown time  . methadone (DOLOPHINE) 5 MG tablet Take 1 tablet (5 mg total) by mouth at bedtime. 30 tablet 0 11/18/2014 at Unknown time  . oxyCODONE (ROXICODONE) 15  MG immediate release tablet Take 1 tablet (15 mg total) by mouth every 4 (four) hours as needed for pain. 90 tablet 0 11/18/2014 at 1700  . Prenatal Vit-Fe Fumarate-FA (PRENATAL MULTIVITAMIN) TABS tablet Take 1 tablet by mouth daily at 12 noon.   Past Week at Unknown time  . methadone (DOLOPHINE) 5 MG tablet Take 1 tablet (5 mg total) by mouth at bedtime. (Patient not taking: Reported on 11/18/2014) 30 tablet 0 Not Taking at Unknown time    Review of Systems  Constitutional: Negative for fever and chills.  Cardiovascular: Negative for chest pain.  Gastrointestinal: Positive for abdominal pain  (lower pelvic, crampy).  Musculoskeletal: Positive for joint pain (left knee). Negative for back pain.  Neurological: Negative for headaches.  All other systems reviewed and are negative.  Physical Exam   Blood pressure 119/72, pulse 96, temperature 99 F (37.2 C), temperature source Oral, resp. rate 16, height 5\' 1"  (1.549 m), weight 75.751 kg (167 lb), last menstrual period 03/20/2014, SpO2 100 %.  Physical Exam  Constitutional: She is oriented to person, place, and time. She appears well-developed and well-nourished.  HENT:  Head: Normocephalic.  Neck: Normal range of motion. Neck supple.  Cardiovascular: Normal rate, regular rhythm and normal heart sounds.  Respiratory: Effort normal and breath sounds normal.  Genitourinary: No bleeding in the vagina.  Musculoskeletal:   Left knee: She exhibits swelling.  Left knee tender with palpation  Neurological: She is alert and oriented to person, place, and time.  Skin: Skin is warm and dry.   [place] assumed by Fleet Contras, [facility]     MAU Course  Procedures  Care assumed by me Seabron Spates, CNM)  Addendum to HPI:  This is a 24 y.o. female at [redacted]w[redacted]d who presents for pain presumed to be related to recurrent sickle cell crisis.  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  She was last admitted on 10/23/14 and treated with fluids and PCA Dilaudid.  Her course was complicated by PICC line infection.The PICC line was removed and replaced with a new PICC line. She was initially started on Vancomycin but blood cultures were positive for GNR, which prompted change of therapy to Zosyn; this was done with the recommendation of Dr. Linus Salmons from ID. Further speciation showed Enterobacter cloacae and Klebsiella pneumoniae, antibiotic was changed to Ceftriaxone. The recommendation was for 14 days of therapy 6/13 through 6/26; with daily infusion arranged through the Sickle Cell  clinic Mon-Friday and at [facility] hospital on Sat and Sundays.  Medical, Surgical, Family and Social histories reviewed and are listed above. Medications and allergies reviewed.  Exam: Agree with above. Left knee is a common presentation for crisis pain for her. Her left knee is tender, with slight swelling  Has PICC line in right upper arm, infusing well Consulted Dr Nehemiah Settle. Patient states she usually gets Dilaudid every 20 min x 3 then is usually able to go home  Will do that and infuse fluids  Lab Results Last 24 Hours    Results for orders placed or performed during the hospital encounter of 11/18/14 (from the past 24 hour(s))  CBC Status: Abnormal   Collection Time: 11/18/14 9:48 PM  Result Value Ref Range   WBC 18.5 (H) 4.0 - 10.5 K/uL   RBC 3.18 (L) 3.87 - 5.11 MIL/uL   Hemoglobin 9.7 (L) 12.0 - 15.0 g/dL   HCT 27.2 (L) 36.0 - 46.0 %   MCV  85.5 78.0 - 100.0 fL   MCH 30.5 26.0 - 34.0 pg   MCHC 35.7 30.0 - 36.0 g/dL   RDW 19.8 (H) 11.5 - 15.5 %   Platelets 377 150 - 400 K/uL     2310 hrs: Has had two doses. Pain is down to a "6". Requested Benadryl for itching.  2320 hrs: Still no relief, will give 3rd dose per consult Dr Nehemiah Settle 0030 hrs: 4th dose authorized per Dr Nehemiah Settle He states if she does not get good relief from that, we will admit and start her on PCA as per previous admission as follows:  Dilaudid PCA is started at the following settings: Bolus 0.7 mg, lockout 10 minutes, limit 4.2 mg one hour  She states she did not get much relief  Assessment and Plan  A; [facility]UP at [redacted]w[redacted]d   Sickle cell crisis       Hemoglobin 9.7   P: Admit to Womens Unit  IV hydration  PCA Dilaudid  NST q shift  Consult SIckle Cell Team tomorrow           Revision History

## 2014-11-19 NOTE — Plan of Care (Signed)
Problem: Consults Goal: Birthing Suites Patient Information Press F2 to bring up selections list  Outcome: Not Applicable Date Met:  08/81/10 Patient admitted to antenatal.

## 2014-11-19 NOTE — Progress Notes (Signed)
Entered pt's room to give apple juice and heard the IV buttons being pushed. Pt asked me to wait before entering. I entered the room to find the pump had been moved close to the bed and facing her. She was sitting up and appearing to be pressing buttons. I asked why the pump was beeping. Pt states she pressed a button by accident and that she was trying to fix it. Upon examining the pump I found her fluids to be running at 775ml/hr. I instructed her to call out if the pump alarms or if she accidentally presses a button and not to touch the machine herself. I moved the pump away from the bed and faced it away from her. I exited the room. A few minutes later, I heard the pump alarming from the desk. By the time I entered the room, the patient had silenced the alarm and the pump was once again close to her and facing her. I again instructed her not to press any buttons but to call out for help if the pump alarms. I again moved the pump away from the bed.

## 2014-11-19 NOTE — Progress Notes (Signed)
Transferred to 318 via wheelchair. Report given to Shela Nevin RN

## 2014-11-19 NOTE — Progress Notes (Signed)
Patient ID: [name], female   DOB: 02-29-1991, 23 y.o.   MRN: [id number] I spoke with Dr. Burnett Harry of MFM re the long term management of this pt.  She rec delivery at 37 weeks.  EFW currently 34thile.   [name], M.D., Cherlynn June

## 2014-11-19 NOTE — Progress Notes (Signed)
Psychosocial assessment completed.  Full documentation to follow.

## 2014-11-20 LAB — BASIC METABOLIC PANEL
ANION GAP: 4 — AB (ref 5–15)
BUN: 5 mg/dL — ABNORMAL LOW (ref 6–20)
CALCIUM: 8.5 mg/dL — AB (ref 8.9–10.3)
CHLORIDE: 106 mmol/L (ref 101–111)
CO2: 24 mmol/L (ref 22–32)
CREATININE: 0.46 mg/dL (ref 0.44–1.00)
GFR calc non Af Amer: >60 mL/min (ref >60)
Glucose, Bld: 90 mg/dL (ref 65–99)
Potassium: 3.9 mmol/L (ref 3.5–5.1)
SODIUM: 134 mmol/L — AB (ref 135–145)

## 2014-11-20 LAB — HEMOGLOBINOPATHY EVALUATION
HGB A: 51.6 % — AB (ref 94.0–98.0)
HGB F QUANT: 3.7 % — AB (ref 0.0–2.0)
Hgb A2 Quant: 3.4 % — ABNORMAL HIGH (ref 0.7–3.1)
Hgb C: 0 %
Hgb S Quant: 41.3 % — ABNORMAL HIGH

## 2014-11-20 LAB — CBC WITH DIFFERENTIAL/PLATELET
Basophils Absolute: 0 10*3/uL (ref 0.0–0.1)
Basophils Relative: 0 % (ref 0–1)
Eosinophils Absolute: 0.3 10*3/uL (ref 0.0–0.7)
Eosinophils Relative: 2 % (ref 0–5)
HCT: 26.2 % — ABNORMAL LOW (ref 36.0–46.0)
Hemoglobin: 8.9 g/dL — ABNORMAL LOW (ref 12.0–15.0)
Lymphocytes Relative: 15 % (ref 12–46)
Lymphs Abs: 2.4 10*3/uL (ref 0.7–4.0)
MCH: 29.4 pg (ref 26.0–34.0)
MCHC: 34 g/dL (ref 30.0–36.0)
MCV: 86.5 fL (ref 78.0–100.0)
MONO ABS: 1.5 10*3/uL — AB (ref 0.1–1.0)
Monocytes Relative: 9 % (ref 3–12)
NEUTROS PCT: 74 % (ref 43–77)
Neutro Abs: 12.3 10*3/uL — ABNORMAL HIGH (ref 1.7–7.7)
Platelets: 313 10*3/uL (ref 150–400)
RBC: 3.03 MIL/uL — ABNORMAL LOW (ref 3.87–5.11)
RDW: 20.8 % — AB (ref 11.5–15.5)
WBC: 16.5 10*3/uL — ABNORMAL HIGH (ref 4.0–10.5)

## 2014-11-20 LAB — OB RESULTS CONSOLE GBS: GBS: NEGATIVE

## 2014-11-20 NOTE — Clinical SW OB High Risk (Signed)
Clinical Social Work Antenatal   Clinical Social Worker:  Alphonzo Cruise, R[place] Date/Time:  11/20/2014, 5:30 PM Gestational Age on Admission:  24 y.o. Admitting Diagnosis: Sickle Cell Crisis   Expected Delivery Date:  12/25/14  Family/Home Environment  Home Address: [address] [place], [place] [address]  Household Member/Support Name:  Relationship:    Other Support: Patient gave numerous people as supports.  She specifically states that her mother, stepfather, father (in [place]), 3 grandmothers, aunt, and 3 sisters (1 in [place]) are her main support people.  Her mother and stepfather live locally.     Psychosocial Data  Information Source:  Patient Interview Resources:    Employment: Patient works at [employer] and receives [place].   Medicaid [facility]): [employer]:    Current Grade:   Homebound Arranged: No  Other Resources:  Medicaid (Medicare)  Cultural/Environment Issues Impacting Care: None stated   Strengths/Weaknesses/Factors to Consider  Concerns Related to Hospitalization: None stated   Previous Pregnancies/Feelings Towards Pregnancy?  Concerns related to being/becoming a mother?: Patient reports that it was always important to her that she be married if she was going to have a baby.  She thinks she became somewhat careless in preventing pregnancy because she same to believe she would never have children.  She states she is "over it" now and "excited" about becoming a mother.  She added that she will be married if she has another child.   Social Support (FOB? Who is/will be helping with baby/other kids?): Patient states FOB is not involved.  She reports that she recently broke up with her boyfriend (not FOB).  She states her family will support her when the baby arrives and that she is contemplating how to inform FOB's mother.  Patient states she has an address for PGM and is debating whether she should show up there with the baby or if she  should mail PGM a letter.  Patient asked CSW for CSW's advice.  CSW did not provide advice, but rather asked patient to put herself in FOB's mother's position and consider how she would want to find out about a grandchild.  Patient thinks she will write a letter.  Couples Relationship (describe): N/A   Recent Stressful Life Events (life changes in past year?): Patient has Sickle Cell Disease and has had multiple admissions throughout pregnancy due to pain crisis.     Prenatal Care/Education/Home Preparations: Patient states her baby shower is this Saturday.  She reports that she had maternity photos taken at [facility] this past Sunday, which she was very excited about.    Domestic Violence (of any type):  No If Yes to Domestic Violence, Describe/Action Plan:    Substance Use During Pregnancy: No (If Yes, Complete SBIRT)  Follow-up Recommendations: Call CSW if patient feels she would like to process her feelings.    Patient Advised/Response: Patient states, "I needed someone to talk to.  You (CSW) really took the stress off my situation and distracted me from my knee pain."     Other:     Clinical Assessment/Plan: Patient appeared to be in a positive mood and receptive to CSW intervention.  She was extremely talkative, almost to the point of pressured speech.  However, CSW was able to follow patient's thought process' for the most part.  She appeared eager to talk with someone.  CSW mainly provided active listening with a focus on empowerment.  Patient has many aspirations, such as Diplomatic Services operational officer, model and lawyer.  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  She talked about being effected by illness and Trichotillimania since she was 24 years old and how she was bullied in school because of this.  Her dream is to make affordable wigs for people in similar situations.  Patient told CSW, "I'm a good person.  I have a good heart."  Patient spoke about  growing up "in the hood" and how this effected the person she is today also.  She states she wants her son to have everything her illness and environment did not allow for her to have as a child.  CSW provided space for patient to talk about things on her mind and eventually told patient that CSW would have to return another day.  Patient asked that CSW return again sometime and told CSW that "we can talk about how I cope with my sickle cell."  CSW spent over an hour with patient today and will return at a later time to further offer support.

## 2014-11-20 NOTE — Progress Notes (Signed)
Patient ID: [name], female   DOB: [birth year], 24 y.o.   MRN: [id number] [place]) NOTE  [name] is a 24 y.o. G2P0010 at [redacted]w[redacted]d by best clinical estimate who is admitted for sickle cell pain crises.   Fetal presentation is unsure. Length of Stay:  1  Days  Subjective: Feels better.  Thinks she might be ready for d/c in the am. Patient reports the fetal movement as active. Patient reports uterine contraction  activity as none. Patient reports  vaginal bleeding as none. Patient describes fluid per vagina as None.  Vitals:  Blood pressure 91/40, pulse 96, temperature 98.8 F (37.1 C), temperature source Axillary, resp. rate 12, height 5\' 1"  (1.549 m), weight 173 lb 12 oz (78.812 kg), last menstrual period 03/20/2014, SpO2 96 %. Physical Examination:  General appearance - alert, well appearing, and in no distress Abdomen - gravid, NT Fundal Height:  size equals dates Extremities: extremities normal, atraumatic, no cyanosis or edema  Membranes:intact  Fetal Monitoring:  Baseline: 130 bpm, Variability: Good {> 6 bpm), Accelerations: Reactive and Decelerations: Absent  Labs:  Results for orders placed or performed during the hospital encounter of 11/18/14 (from the past 24 hour(s))  CBC with Differential/Platelet   Collection Time: 11/20/14  6:34 AM  Result Value Ref Range   WBC 16.5 (H) 4.0 - 10.5 K/uL   RBC 3.03 (L) 3.87 - 5.11 MIL/uL   Hemoglobin 8.9 (L) 12.0 - 15.0 g/dL   HCT 26.2 (L) 36.0 - 46.0 %   MCV 86.5 78.0 - 100.0 fL   MCH 29.4 26.0 - 34.0 pg   MCHC 34.0 30.0 - 36.0 g/dL   RDW 20.8 (H) 11.5 - 15.5 %   Platelets 313 150 - 400 K/uL   Neutrophils Relative % 74 43 - 77 %   Neutro Abs 12.3 (H) 1.7 - 7.7 K/uL   Lymphocytes Relative 15 12 - 46 %   Lymphs Abs 2.4 0.7 - 4.0 K/uL   Monocytes Relative 9 3 - 12 %   Monocytes Absolute 1.5 (H) 0.1 - 1.0 K/uL   Eosinophils Relative 2 0 - 5 %   Eosinophils Absolute 0.3 0.0 - 0.7  K/uL   Basophils Relative 0 0 - 1 %   Basophils Absolute 0.0 0.0 - 0.1 K/uL  Basic metabolic panel   Collection Time: 11/20/14  6:34 AM  Result Value Ref Range   Sodium 134 (L) 135 - 145 mmol/L   Potassium 3.9 3.5 - 5.1 mmol/L   Chloride 106 101 - 111 mmol/L   CO2 24 22 - 32 mmol/L   Glucose, Bld 90 65 - 99 mg/dL   BUN 5 (L) 6 - 20 mg/dL   Creatinine, Ser 0.46 0.44 - 1.00 mg/dL   Calcium 8.5 (L) 8.9 - 10.3 mg/dL   GFR calc non Af Amer >60 >60 mL/min   GFR calc Af Amer >60 >60 mL/min   Anion gap 4 (L) 5 - 15     Medications:  Scheduled . docusate sodium  100 mg Oral Daily  . HYDROmorphone PCA 0.3 mg/mL   Intravenous 6 times per day  . methadone  5 mg Oral QHS  . prenatal vitamin w/FE, FA  1 tablet Oral Q1200   I have reviewed the patient's current medications.  ASSESSMENT: Patient Active Problem List   Diagnosis Date Noted  . Hypokalemia 10/29/2014  . Line sepsis 10/27/2014  . Sickle cell crisis 10/23/2014  . 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  Supervision of high-risk pregnancy 06/11/2014  . Chronic pain 02/26/2013  . Anemia of chronic disease 10/05/2012  . 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 Mood swings 11/08/2011  . Migraines 11/08/2011  . Overweight(278.02) 05/24/2011  . GERD (gastroesophageal reflux disease) 02/17/2011  . Depression 01/06/2011  . Back pain 09/17/2010  . Sickle cell disease 01/08/2009  . Trichotillomania 01/08/2009    PLAN: Improved pain control.  Possible d/c in am. For IOL at 37 wks--will schedule. Check [facility] NAS referral--called today   Rushawn Capshaw S, MD 11/20/2014,2:30 PM

## 2014-11-20 NOTE — Progress Notes (Signed)
[name] drawn lab work from patient PICC Line this AM

## 2014-11-21 ENCOUNTER — Other Ambulatory Visit: Payer: Self-pay | Admitting: Family Medicine

## 2014-11-21 DIAGNOSIS — D5700 Hb-SS disease with crisis, unspecified: Secondary | ICD-10-CM

## 2014-11-21 DIAGNOSIS — G8929 Other chronic pain: Secondary | ICD-10-CM

## 2014-11-21 DIAGNOSIS — D571 Sickle-cell disease without crisis: Secondary | ICD-10-CM

## 2014-11-21 MED ORDER — HEPARIN SOD (PORK) LOCK FLUSH 100 UNIT/ML IV SOLN
250.0000 [IU] | INTRAVENOUS | Status: DC | PRN
  Administered 2014-11-21: 250 [IU]
  Filled 2014-11-21: qty 3

## 2014-11-21 MED ORDER — OXYCODONE HCL 15 MG PO TABS: 15.0000 mg | ORAL_TABLET | ORAL | Status: DC | PRN

## 2014-11-21 NOTE — Progress Notes (Signed)
NICU NAS consult completed with patient.

## 2014-11-21 NOTE — Care Management (Signed)
Important Message  Patient Details  Name: [name] MRN: [id number] Date of Birth: 1990-08-13   Medicare Important Message Given:       Shelda Altes 11/21/2014, 8:28 AM

## 2014-11-21 NOTE — Progress Notes (Signed)
CSW attempted to meet with patient to follow up/offer support, but was unaware that she was discharging today.  Patient already gone.

## 2014-11-21 NOTE — Discharge Summary (Signed)
Physician Discharge Summary  Patient ID: [name] MRN: [id number] DOB/AGE: 23-Jun-1990 23 y.o.  Admit date: 11/18/2014 Discharge date: 11/21/2014  Admission Diagnoses: Intrauterine pregnancy at [redacted]w[redacted]d and sickle cell crisis  Discharge Diagnoses: Intrauterine pregnancy at [redacted]w[redacted]d Active Problems:   Sickle cell crisis   Discharged Condition: good  Hospital Course: Patient admitted with sickle cell crisis in third trimester of pregnancy. Her pain was managed with a dilaudid PCA pump. There were no fetal/obstetrical issues. On the day of discharge, the patient requested to be discharged in the afternoon.  Consults: Sickle cell team  Treatments: IV hydration and dilaudid PCA  Discharge Exam: Blood pressure 105/64, pulse 97, temperature 98.4 F (36.9 C), temperature source Oral, resp. rate 17, height 5\' 1"  (1.549 m), weight 173 lb 12 oz (78.812 kg), last menstrual period 03/20/2014, SpO2 97 %. See progress note of 11/21/2014  Disposition: 01-Home or Self Care     Medication List    TAKE these medications        cyclobenzaprine 10 MG tablet  Commonly known as:  FLEXERIL  Take 1 tablet (10 mg total) by mouth 3 (three) times daily as needed for muscle spasms.     docusate sodium 100 MG capsule  Commonly known as:  COLACE  Take 1 capsule (100 mg total) by mouth daily.     folic acid 1 MG tablet  Commonly known as:  FOLVITE  Take 2 tablets (2 mg total) by mouth daily.     methadone 5 MG tablet  Commonly known as:  DOLOPHINE  Take 1 tablet (5 mg total) by mouth at bedtime.     oxyCODONE 15 MG immediate release tablet  Commonly known as:  ROXICODONE  Take 1 tablet (15 mg total) by mouth every 4 (four) hours as needed for pain. Will need office visit with next refill     prenatal multivitamin Tabs tablet  Take 1 tablet by mouth daily at 12 noon.       Follow-up Information    Follow up with [facility].   Specialty:  Obstetrics and Gynecology   Why:  As  scheduled on 7/14   Contact information:   [place] [phone]      Please follow up.   Why:  Follow up with sickle cell tema today before 5 pm for pain prescription      Signed: Eugena Rhue 11/21/2014, 1:40 PM

## 2014-11-21 NOTE — Progress Notes (Signed)
Patient ID: [name], female   DOB: [birth year], 24 y.o.   MRN: [id number] [place]) NOTE  [name] is a 24 y.o. G2P0010 at [redacted]w[redacted]d by best clinical estimate who is admitted for sickle cell pain crisis.   Fetal presentation is cephalic. Length of Stay:  2  Days  Subjective: Feels some better.  Thinks she will be ready to d/c PCA around lunch. Patient reports the fetal movement as active. Patient reports uterine contraction  activity as none. Patient reports  vaginal bleeding as none. Patient describes fluid per vagina as None.  Vitals:  Blood pressure 93/50, pulse 102, temperature 98.9 F (37.2 C), temperature source Oral, resp. rate 15, height 5\' 1"  (1.549 m), weight 173 lb 12 oz (78.812 kg), last menstrual period 03/20/2014, SpO2 100 %. Physical Examination:  General appearance - alert, well appearing, and in no distress Abdomen - gravid, NT Fundal Height:  size equals dates Extremities: Homans sign is negative, no sign of DVT  Membranes:intact  Fetal Monitoring:  Baseline: 145 bpm, Variability: Good {> 6 bpm), Accelerations: Reactive and Decelerations: Absent   Medications:  Scheduled . docusate sodium  100 mg Oral Daily  . HYDROmorphone PCA 0.3 mg/mL   Intravenous 6 times per day  . methadone  5 mg Oral QHS  . prenatal vitamin w/FE, FA  1 tablet Oral Q1200   I have reviewed the patient's current medications.  ASSESSMENT: Active Problems:   Sickle cell crisis   PLAN: Continue IV hydration and pain control.  Possible discharge today.  Nancy Jude, MD 11/21/2014,8:01 AM

## 2014-11-21 NOTE — Progress Notes (Signed)
Pt d/c home   Plan to go to NICU  For tour  And  Will   Go to [facility] for prescription

## 2014-11-21 NOTE — Progress Notes (Signed)
Patient refused to be weigh this am

## 2014-11-21 NOTE — Plan of Care (Signed)
Problem: Consults Goal: NICU Tour Outcome: Completed/Met Date Met:  11/21/14 Went day of d/c

## 2014-11-21 NOTE — Progress Notes (Signed)
Patient declined fetal monitoring.

## 2014-11-22 LAB — CULTURE, BETA STREP (GROUP B ONLY)

## 2014-11-23 LAB — TYPE AND SCREEN
ABO/RH(D): B POS
Antibody Screen: NEGATIVE
Unit division: 0
Unit division: 0

## 2014-11-26 ENCOUNTER — Inpatient Hospital Stay (HOSPITAL_COMMUNITY)
Admission: AD | Admit: 2014-11-26 | Discharge: 2014-12-01 | DRG: 781 | Disposition: A | Discharge status: Home / Self Care | Payer: Medicare Other | Source: Ambulatory Visit | Attending: Obstetrics and Gynecology | Admitting: Obstetrics and Gynecology

## 2014-11-26 ENCOUNTER — Telehealth (HOSPITAL_COMMUNITY): Payer: Self-pay | Admitting: *Deleted

## 2014-11-26 ENCOUNTER — Encounter (HOSPITAL_COMMUNITY): Payer: Self-pay

## 2014-11-26 ENCOUNTER — Encounter (HOSPITAL_COMMUNITY): Payer: Self-pay | Admitting: *Deleted

## 2014-11-26 DIAGNOSIS — D5700 Hb-SS disease with crisis, unspecified: Secondary | ICD-10-CM | POA: Diagnosis present

## 2014-11-26 DIAGNOSIS — G43909 Migraine, unspecified, not intractable, without status migrainosus: Secondary | ICD-10-CM | POA: Diagnosis present

## 2014-11-26 DIAGNOSIS — O99713 Diseases of the skin and subcutaneous tissue complicating pregnancy, third trimester: Secondary | ICD-10-CM | POA: Diagnosis present

## 2014-11-26 DIAGNOSIS — O09293 Supervision of pregnancy with other poor reproductive or obstetric history, third trimester: Secondary | ICD-10-CM | POA: Diagnosis not present

## 2014-11-26 DIAGNOSIS — Z87891 Personal history of nicotine dependence: Secondary | ICD-10-CM | POA: Diagnosis not present

## 2014-11-26 DIAGNOSIS — K219 Gastro-esophageal reflux disease without esophagitis: Secondary | ICD-10-CM | POA: Diagnosis present

## 2014-11-26 DIAGNOSIS — O99613 Diseases of the digestive system complicating pregnancy, third trimester: Secondary | ICD-10-CM | POA: Diagnosis present

## 2014-11-26 DIAGNOSIS — M25561 Pain in right knee: Secondary | ICD-10-CM | POA: Diagnosis not present

## 2014-11-26 DIAGNOSIS — O99013 Anemia complicating pregnancy, third trimester: Secondary | ICD-10-CM | POA: Diagnosis not present

## 2014-11-26 DIAGNOSIS — Z9049 Acquired absence of other specified parts of digestive tract: Secondary | ICD-10-CM | POA: Diagnosis present

## 2014-11-26 DIAGNOSIS — M549 Dorsalgia, unspecified: Secondary | ICD-10-CM | POA: Diagnosis present

## 2014-11-26 DIAGNOSIS — Z3A35 35 weeks gestation of pregnancy: Secondary | ICD-10-CM | POA: Diagnosis present

## 2014-11-26 DIAGNOSIS — O9989 Other specified diseases and conditions complicating pregnancy, childbirth and the puerperium: Secondary | ICD-10-CM | POA: Diagnosis present

## 2014-11-26 DIAGNOSIS — L299 Pruritus, unspecified: Secondary | ICD-10-CM | POA: Diagnosis present

## 2014-11-26 DIAGNOSIS — O99343 Other mental disorders complicating pregnancy, third trimester: Secondary | ICD-10-CM | POA: Diagnosis present

## 2014-11-26 DIAGNOSIS — G8929 Other chronic pain: Secondary | ICD-10-CM | POA: Diagnosis present

## 2014-11-26 DIAGNOSIS — Z3A36 36 weeks gestation of pregnancy: Secondary | ICD-10-CM | POA: Diagnosis not present

## 2014-11-26 DIAGNOSIS — O99353 Diseases of the nervous system complicating pregnancy, third trimester: Secondary | ICD-10-CM | POA: Diagnosis present

## 2014-11-26 DIAGNOSIS — R031 Nonspecific low blood-pressure reading: Secondary | ICD-10-CM | POA: Diagnosis not present

## 2014-11-26 LAB — RETICULOCYTES
RBC.: 3.33 MIL/uL — ABNORMAL LOW (ref 3.87–5.11)
RETIC COUNT ABSOLUTE: 552.8 10*3/uL — AB (ref 19.0–186.0)
RETIC CT PCT: 16.6 % — AB (ref 0.4–3.1)

## 2014-11-26 LAB — URINE MICROSCOPIC-ADD ON

## 2014-11-26 LAB — COMPREHENSIVE METABOLIC PANEL
ALBUMIN: 3.3 g/dL — AB (ref 3.5–5.0)
ALT: 16 U/L (ref 14–54)
AST: 20 U/L (ref 15–41)
Alkaline Phosphatase: 94 U/L (ref 38–126)
Anion gap: 4 — ABNORMAL LOW (ref 5–15)
BUN: 6 mg/dL (ref 6–20)
CALCIUM: 8.3 mg/dL — AB (ref 8.9–10.3)
CHLORIDE: 109 mmol/L (ref 101–111)
CO2: 20 mmol/L — ABNORMAL LOW (ref 22–32)
Creatinine, Ser: 0.45 mg/dL (ref 0.44–1.00)
GFR calc Af Amer: >60 mL/min (ref >60)
GFR calc non Af Amer: >60 mL/min (ref >60)
Glucose, Bld: 98 mg/dL (ref 65–99)
POTASSIUM: 3.9 mmol/L (ref 3.5–5.1)
Sodium: 133 mmol/L — ABNORMAL LOW (ref 135–145)
TOTAL PROTEIN: 6.8 g/dL (ref 6.5–8.1)
Total Bilirubin: 2.2 mg/dL — ABNORMAL HIGH (ref 0.3–1.2)

## 2014-11-26 LAB — CBC WITH DIFFERENTIAL/PLATELET
Basophils Absolute: 0 10*3/uL (ref 0.0–0.1)
Basophils Relative: 0 % (ref 0–1)
EOS ABS: 0.2 10*3/uL (ref 0.0–0.7)
EOS PCT: 1 % (ref 0–5)
HEMATOCRIT: 29.1 % — AB (ref 36.0–46.0)
Hemoglobin: 10.1 g/dL — ABNORMAL LOW (ref 12.0–15.0)
Lymphocytes Relative: 13 % (ref 12–46)
Lymphs Abs: 2.2 10*3/uL (ref 0.7–4.0)
MCH: 30.3 pg (ref 26.0–34.0)
MCHC: 34.7 g/dL (ref 30.0–36.0)
MCV: 87.4 fL (ref 78.0–100.0)
Monocytes Absolute: 1.4 10*3/uL — ABNORMAL HIGH (ref 0.1–1.0)
Monocytes Relative: 8 % (ref 3–12)
Neutro Abs: 13.4 10*3/uL — ABNORMAL HIGH (ref 1.7–7.7)
Neutrophils Relative %: 78 % — ABNORMAL HIGH (ref 43–77)
Other: 0 %
PLATELETS: 348 10*3/uL (ref 150–400)
RBC: 3.33 MIL/uL — ABNORMAL LOW (ref 3.87–5.11)
RDW: 20.6 % — ABNORMAL HIGH (ref 11.5–15.5)
WBC: 17.2 10*3/uL — ABNORMAL HIGH (ref 4.0–10.5)

## 2014-11-26 LAB — URINALYSIS, ROUTINE W REFLEX MICROSCOPIC
Bilirubin Urine: NEGATIVE
GLUCOSE, UA: NEGATIVE mg/dL
Hgb urine dipstick: NEGATIVE
Ketones, ur: NEGATIVE mg/dL
Nitrite: NEGATIVE
PROTEIN: NEGATIVE mg/dL
SPECIFIC GRAVITY, URINE: 1.01 (ref 1.005–1.030)
Urobilinogen, UA: 1 mg/dL (ref 0.0–1.0)
pH: 6.5 (ref 5.0–8.0)

## 2014-11-26 LAB — LACTATE DEHYDROGENASE: LDH: 179 U/L (ref 98–192)

## 2014-11-26 MED ORDER — LACTATED RINGERS IV BOLUS (SEPSIS)
1000.0000 mL | Freq: Once | INTRAVENOUS | Status: AC
  Administered 2014-11-26: 1000 mL via INTRAVENOUS

## 2014-11-26 MED ORDER — HYDROMORPHONE 0.3 MG/ML IV SOLN
INTRAVENOUS | Status: DC
  Administered 2014-11-27: 6.29 mg via INTRAVENOUS
  Administered 2014-11-27 (×2): via INTRAVENOUS
  Administered 2014-11-27 (×2): 0.3 mg via INTRAVENOUS
  Administered 2014-11-27: 5.81 mg via INTRAVENOUS
  Administered 2014-11-27: via INTRAVENOUS
  Administered 2014-11-27: 0.3 mg via INTRAVENOUS
  Administered 2014-11-28: 2.8 mg via INTRAVENOUS
  Administered 2014-11-28: 7.6 mg via INTRAVENOUS
  Administered 2014-11-28 (×2): 0.3 mg via INTRAVENOUS
  Administered 2014-11-28: 21:00:00 via INTRAVENOUS
  Administered 2014-11-28: 6.1 mg via INTRAVENOUS
  Administered 2014-11-28: 3.31 mg via INTRAVENOUS
  Administered 2014-11-28: 11.81 mg via INTRAVENOUS
  Administered 2014-11-28: 7.63 mg via INTRAVENOUS
  Administered 2014-11-29: 4.73 mg via INTRAVENOUS
  Administered 2014-11-29: 6.2 mg via INTRAVENOUS
  Administered 2014-11-29: 5.59 mg via INTRAVENOUS
  Administered 2014-11-29: 3.49 mg via INTRAVENOUS
  Administered 2014-11-29: 20:00:00 via INTRAVENOUS
  Administered 2014-11-29: 4.73 mg via INTRAVENOUS
  Administered 2014-11-29: 6.2 mg via INTRAVENOUS
  Administered 2014-11-29 – 2014-11-30 (×4): via INTRAVENOUS
  Administered 2014-11-30: 8.1 mg via INTRAVENOUS
  Administered 2014-11-30: 5.54 mg via INTRAVENOUS
  Administered 2014-11-30: 15.41 mg via INTRAVENOUS
  Administered 2014-11-30 – 2014-12-01 (×6): via INTRAVENOUS
  Administered 2014-12-01: 3.49 mg via INTRAVENOUS
  Administered 2014-12-01: 8.36 mg via INTRAVENOUS
  Administered 2014-12-01: 9.04 mg via INTRAVENOUS
  Administered 2014-12-01: 6.2 mg via INTRAVENOUS
  Filled 2014-11-26 (×22): qty 25

## 2014-11-26 MED ORDER — LACTATED RINGERS IV SOLN
INTRAVENOUS | Status: DC
  Administered 2014-11-26 – 2014-12-01 (×14): via INTRAVENOUS

## 2014-11-26 MED ORDER — NALOXONE HCL 0.4 MG/ML IJ SOLN: 0.4000 mg | INTRAMUSCULAR | Status: DC | PRN

## 2014-11-26 MED ORDER — CALCIUM CARBONATE ANTACID 500 MG PO CHEW
2.0000 | CHEWABLE_TABLET | ORAL | Status: DC | PRN
  Administered 2014-12-01: 400 mg via ORAL
  Filled 2014-11-26 (×2): qty 2

## 2014-11-26 MED ORDER — DIPHENHYDRAMINE HCL 25 MG PO CAPS
25.0000 mg | ORAL_CAPSULE | Freq: Four times a day (QID) | ORAL | Status: DC | PRN
  Filled 2014-11-26 (×2): qty 1

## 2014-11-26 MED ORDER — HYDROMORPHONE HCL 2 MG/ML IJ SOLN
2.0000 mg | INTRAMUSCULAR | Status: DC | PRN
  Administered 2014-11-26 (×3): 2 mg via INTRAVENOUS
  Filled 2014-11-26 (×3): qty 1

## 2014-11-26 MED ORDER — SODIUM CHLORIDE 0.9 % IJ SOLN
9.0000 mL | INTRAMUSCULAR | Status: DC | PRN
  Filled 2014-11-26 (×2): qty 9

## 2014-11-26 MED ORDER — ONDANSETRON HCL 4 MG/2ML IJ SOLN: 4.0000 mg | Freq: Four times a day (QID) | INTRAMUSCULAR | Status: DC | PRN

## 2014-11-26 MED ORDER — DOCUSATE SODIUM 100 MG PO CAPS
100.0000 mg | ORAL_CAPSULE | Freq: Every day | ORAL | Status: DC
  Administered 2014-11-27 – 2014-12-01 (×5): 100 mg via ORAL
  Filled 2014-11-26 (×6): qty 1

## 2014-11-26 MED ORDER — PRENATAL MULTIVITAMIN CH
1.0000 | ORAL_TABLET | Freq: Every day | ORAL | Status: DC
  Administered 2014-11-30: 1 via ORAL
  Filled 2014-11-26 (×6): qty 1

## 2014-11-26 MED ORDER — DIPHENHYDRAMINE HCL 25 MG PO CAPS
25.0000 mg | ORAL_CAPSULE | Freq: Once | ORAL | Status: AC
  Administered 2014-11-26: 25 mg via ORAL
  Filled 2014-11-26: qty 1

## 2014-11-26 MED ORDER — ACETAMINOPHEN 325 MG PO TABS: 650.0000 mg | ORAL_TABLET | ORAL | Status: DC | PRN

## 2014-11-26 MED ORDER — FOLIC ACID 1 MG PO TABS
2.0000 mg | ORAL_TABLET | Freq: Every day | ORAL | Status: DC
  Administered 2014-11-27 – 2014-12-01 (×5): 2 mg via ORAL
  Filled 2014-11-26 (×7): qty 2

## 2014-11-26 NOTE — Progress Notes (Signed)
Contacted Doctor, general practice in [place] regarding PICC line and she will come assess PICC line so IVF and pain med can be given.

## 2014-11-26 NOTE — MAU Note (Addendum)
Was discharged from antenatal on Friday, Came in to MAU by EMS at this time, having sickle cell crisis.  At noon, started having right knee pain, using pain med and heating pad and not helping. Yesterday had pain under right breast, felt like a bruise, no pain at this time. Baby moving well. No bleeding. No leaking.

## 2014-11-26 NOTE — H&P (Signed)
[name] is a 24 y.o. female G2P0010 at [redacted]w[redacted]d by LMP c/w 10wk [place] presenting for sickle cell pain crisis.  Recently admitted 7/6-7/8 for similar episode, on Dilaudid PCA.  Reports pain staarted again around noon today and did not improve with one dose of home Oxycodone 15mg .  Pain interferes with ambulation.  +FM, no contractions, VB, LOF.  Clinic HRC  Dating LMP - consistent with 10 week U/S  Genetic Screen Pt declined genetic testing   Anatomic [place]   GTT Early:               Third trimester:   TDaP vaccine   Flu vaccine  done in October 2015  GBS negative  Contraception   Baby Food undecided  Circumcision   Pediatrician   Support Person    11/19/2014 per Dr. Burnett Harry of [facility] rec delivery at 60 weeks.  EFW currently 34thile.  Maternal Medical History:  Reason for admission: Nausea.    OB History    Gravida Para Term Preterm AB TAB SAB Ectopic Multiple Living   2    1  1    0      Obstetric Comments   Miscarried in October 2012 at about 7 weeks     Past Medical History  Diagnosis Date  . Miscarriage 03/22/2011    Pt reports 2 miscarriages.  . Depression 01/06/2011  . GERD (gastroesophageal reflux disease) 02/17/2011  . Trichotillomania     h/o  . Blood transfusion     "lots"  . Sickle cell anemia with crisis   . Exertional dyspnea     "sometimes"  . Migraines 11/08/11    "@ least twice/month"  . Chronic back pain     "very severe; have knot in my back; from tight muscle; take RX and exercise for it"  . Mood swings 11/08/11    "I go back and forth; real bad"  . Sickle cell anemia    Past Surgical History  Procedure Laterality Date  . Cholecystectomy  05/2010  . Dilation and curettage of uterus  02/20/11    S/P miscarriage   Family History: family history includes Diabetes in her maternal grandfather, maternal grandmother, and paternal grandmother; Hypertension in her paternal grandmother; Sickle cell trait in her father and mother. Social History:  reports that she  quit smoking about 20 months ago. Her smoking use included Cigarettes. She has a .25 pack-year smoking history. She has never used smokeless tobacco. She reports that she does not drink alcohol or use illicit drugs.   Review of Systems  Constitutional: Negative for fever and chills.  Eyes: Negative for blurred vision.  Respiratory: Negative for cough, shortness of breath and wheezing.   Cardiovascular: Negative for chest pain and leg swelling.  Gastrointestinal: Positive for heartburn. Negative for nausea, vomiting and abdominal pain.  Genitourinary: Negative for dysuria, urgency and frequency.  Musculoskeletal: Positive for joint pain.  Skin: Positive for itching. Negative for rash.  All other systems reviewed and are negative.     Blood pressure 112/64, pulse 92, temperature 98.4 F (36.9 C), temperature source Oral, resp. rate 18, last menstrual period 03/20/2014, SpO2 96 %. Maternal Exam:  Uterine Assessment: No contractions on toco  Abdomen: Patient reports no abdominal tenderness.   Fetal Exam Fetal Monitor Review: Baseline rate: 140.  Variability: moderate (6-25 bpm).   Pattern: accelerations present and no decelerations.    Fetal State Assessment: Category I - tracings are normal.     Physical Exam  Constitutional:  She is oriented to person, place, and time. She appears well-developed and well-nourished.  Writhing in pain  HENT:  Head: Normocephalic and atraumatic.  Mouth/Throat: Oropharynx is clear and moist.  Eyes: EOM are normal. Pupils are equal, round, and reactive to light. No scleral icterus.  Neck: Normal range of motion. Neck supple.  Cardiovascular: Normal rate, regular rhythm, normal heart sounds and intact distal pulses.   No murmur heard. Respiratory: Effort normal and breath sounds normal. No respiratory distress. She has no wheezes.  GI: She exhibits no distension. There is no tenderness. There is no rebound and no guarding.  gravid   Musculoskeletal: She exhibits no edema.  TTP over R knee and thigh.  Otherwise normal ROM and no TTP.  Neurological: She is alert and oriented to person, place, and time. No cranial nerve deficit.  Skin: Skin is warm and dry. No rash noted.  Psychiatric: She has a normal mood and affect. Her behavior is normal. Thought content normal.    Prenatal labs: ABO, Rh: --/--/B POS (07/06 0535) Antibody: NEG (07/06 0535) Rubella: 0.69 (01/27 1457) RPR: NON REAC (01/27 1457)  HBsAg: NEGATIVE (01/27 1457)  HIV: NONREACTIVE (12/21 1550)  GBS: Negative (07/07 0000)   Results for orders placed or performed during the hospital encounter of 11/26/14 (from the past 24 hour(s))  Comprehensive metabolic panel     Status: Abnormal   Collection Time: 11/26/14  7:38 PM  Result Value Ref Range   Sodium 133 (L) 135 - 145 mmol/L   Potassium 3.9 3.5 - 5.1 mmol/L   Chloride 109 101 - 111 mmol/L   CO2 20 (L) 22 - 32 mmol/L   Glucose, Bld 98 65 - 99 mg/dL   BUN 6 6 - 20 mg/dL   Creatinine, Ser 0.45 0.44 - 1.00 mg/dL   Calcium 8.3 (L) 8.9 - 10.3 mg/dL   Total Protein 6.8 6.5 - 8.1 g/dL   Albumin 3.3 (L) 3.5 - 5.0 g/dL   AST 20 15 - 41 U/L   ALT 16 14 - 54 U/L   Alkaline Phosphatase 94 38 - 126 U/L   Total Bilirubin 2.2 (H) 0.3 - 1.2 mg/dL   GFR calc non Af Amer >60 >60 mL/min   GFR calc Af Amer >60 >60 mL/min   Anion gap 4 (L) 5 - 15  CBC with Differential/Platelet     Status: Abnormal   Collection Time: 11/26/14  7:38 PM  Result Value Ref Range   WBC 17.2 (H) 4.0 - 10.5 K/uL   RBC 3.33 (L) 3.87 - 5.11 MIL/uL   Hemoglobin 10.1 (L) 12.0 - 15.0 g/dL   HCT 29.1 (L) 36.0 - 46.0 %   MCV 87.4 78.0 - 100.0 fL   MCH 30.3 26.0 - 34.0 pg   MCHC 34.7 30.0 - 36.0 g/dL   RDW 20.6 (H) 11.5 - 15.5 %   Platelets 348 150 - 400 K/uL   Neutrophils Relative % 78 (H) 43 - 77 %   Lymphocytes Relative 13 12 - 46 %   Monocytes Relative 8 3 - 12 %   Eosinophils Relative 1 0 - 5 %   Basophils Relative 0 0 - 1 %    Other 0 %   Neutro Abs 13.4 (H) 1.7 - 7.7 K/uL   Lymphs Abs 2.2 0.7 - 4.0 K/uL   Monocytes Absolute 1.4 (H) 0.1 - 1.0 K/uL   Eosinophils Absolute 0.2 0.0 - 0.7 K/uL   Basophils Absolute 0.0 0.0 - 0.1 K/uL  RBC Morphology TARGET CELLS    Smear Review LARGE PLATELETS PRESENT   Reticulocytes     Status: Abnormal   Collection Time: 11/26/14  7:38 PM  Result Value Ref Range   Retic Ct Pct 16.6 (H) 0.4 - 3.1 %   RBC. 3.33 (L) 3.87 - 5.11 MIL/uL   Retic Count, Manual 552.8 (H) 19.0 - 186.0 K/uL  Lactate dehydrogenase     Status: None   Collection Time: 11/26/14  7:38 PM  Result Value Ref Range   LDH 179 98 - 192 U/L    Assessment/Plan: IUP at [redacted]w[redacted]d - plan for IOL at 37wks Sickle cell pain crisis - Hgb stable above baseline, retics appropriately elevated, no signs of acute chest, afebrile, not improved s/p 3 doses of IV Dilaudid 2mg  in MAU   Admit to antenatal Dilaudid PCA at previously administered doses - 0.7mg  bolus, 60min lockout, 4.2mg  max in 1hr IV hydration Daily CBC and retic count Fetal monitoring q shift Low threshold to culture/CXR/start empiric abx if febrile or signs of acute chest Consult Sickle Cell Team in AM   Virginia Crews, MD, MPH PGY-2,  [facility] 11/26/2014 10:16 PM   CNM attestation:  I have seen and examined this patient; I agree with above documentation in the resident's note.   [name] is a 24 y.o. G2P0010 here for [place] pain crisis.  PE: BP 114/53 mmHg  Pulse 98  Temp(Src) 98.6 F (37 C) (Oral)  Resp 19  Ht 5\' 2"  (1.575 m)  Wt 77.111 kg (170 lb)  BMI 31.09 kg/m2  SpO2 94%  LMP 03/20/2014  Resp: normal effort, no distress Abd: gravid  ROS, labs, PMH reviewed  Plan: Admit to Antenatal Hydration and pain management  [name], Luana 11/27/2014, 5:05 AM

## 2014-11-26 NOTE — Progress Notes (Signed)
Dr B discussed with pt possibility of going home today and pt states at this time she doesn't feel stable enough to go home, can not put weight on foot, and also says she will take more pain medicine at home than prescribed to try to get her pain under control.  Notified Dr Brita Romp.

## 2014-11-26 NOTE — Telephone Encounter (Signed)
Preadmission screen  

## 2014-11-27 ENCOUNTER — Encounter: Payer: Medicare Other | Admitting: Family Medicine

## 2014-11-27 LAB — RETICULOCYTES
RBC.: 3.07 MIL/uL — AB (ref 3.87–5.11)
Retic Count, Absolute: 555.7 10*3/uL — ABNORMAL HIGH (ref 19.0–186.0)
Retic Ct Pct: 18.1 % — ABNORMAL HIGH (ref 0.4–3.1)

## 2014-11-27 LAB — CBC
HEMATOCRIT: 27 % — AB (ref 36.0–46.0)
Hemoglobin: 9.4 g/dL — ABNORMAL LOW (ref 12.0–15.0)
MCH: 30.6 pg (ref 26.0–34.0)
MCHC: 34.8 g/dL (ref 30.0–36.0)
MCV: 87.9 fL (ref 78.0–100.0)
Platelets: 332 10*3/uL (ref 150–400)
RBC: 3.07 MIL/uL — AB (ref 3.87–5.11)
RDW: 20.7 % — ABNORMAL HIGH (ref 11.5–15.5)
WBC: 17.5 10*3/uL — ABNORMAL HIGH (ref 4.0–10.5)

## 2014-11-27 MED ORDER — HYDROMORPHONE HCL 2 MG/ML IJ SOLN
2.0000 mg | INTRAMUSCULAR | Status: DC | PRN
  Administered 2014-11-27 – 2014-12-01 (×37): 2 mg via INTRAVENOUS
  Filled 2014-11-27 (×39): qty 1

## 2014-11-27 MED ORDER — DIPHENHYDRAMINE HCL 25 MG PO CAPS
25.0000 mg | ORAL_CAPSULE | Freq: Four times a day (QID) | ORAL | Status: DC | PRN
  Administered 2014-11-27: 25 mg via ORAL
  Administered 2014-11-27: 50 mg via ORAL
  Administered 2014-11-30: 25 mg via ORAL
  Filled 2014-11-27 (×2): qty 1
  Filled 2014-11-27 (×2): qty 2

## 2014-11-27 MED ORDER — POLYETHYLENE GLYCOL 3350 17 G PO PACK
17.0000 g | PACK | Freq: Every day | ORAL | Status: DC
  Administered 2014-11-27 – 2014-11-30 (×3): 17 g via ORAL
  Filled 2014-11-27 (×5): qty 1

## 2014-11-27 MED ORDER — DIPHENHYDRAMINE HCL 50 MG/ML IJ SOLN
12.5000 mg | Freq: Once | INTRAMUSCULAR | Status: AC
  Administered 2014-11-27: 12.5 mg via INTRAVENOUS
  Filled 2014-11-27: qty 1

## 2014-11-27 MED ORDER — HYDROMORPHONE HCL 1 MG/ML IJ SOLN
1.0000 mg | INTRAMUSCULAR | Status: DC | PRN
  Administered 2014-11-27: 1 mg via INTRAVENOUS
  Filled 2014-11-27: qty 1

## 2014-11-27 MED ORDER — ALTEPLASE 2 MG IJ SOLR
2.0000 mg | Freq: Once | INTRAMUSCULAR | Status: AC
  Administered 2014-11-27: 2 mg
  Filled 2014-11-27: qty 2

## 2014-11-27 NOTE — Progress Notes (Signed)
RUE double lumen port was checked for blood return. The cap was removed from the purple port and it was flushed with 10 mls NSS. 10 mls blood was withdrawn and discarded without difficulty. The cap was changed and the port was flushed with 10 mls NSS.   The red port cap was changed and flushed with 10 mls NSS. Good blood return noted. IV infusion resumed via Alaris pump.

## 2014-11-27 NOTE — Progress Notes (Signed)
IV team staff here to perform PICC line dressing change & eval  purple port secondary unable to flush or draw blood from.

## 2014-11-27 NOTE — Progress Notes (Signed)
Patient ID: [name], female   DOB: 1990/10/05, 24 y.o.   MRN: [id number] [place]) NOTE  [name] is a 24 y.o. G2P0010 at [redacted]w[redacted]d by best clinical estimate who is admitted for sickle cell crisis involving heels and right knee.   Fetal presentation is unsure. Length of Stay:  1  Days  Subjective: Pt feels worse than at admit, has significant rt knee pain not described at admit. Pt with pruritis not responding to first dose of p.o benadryl . Will give one-time iv dose Patient reports the fetal movement as active. Patient reports uterine contraction  activity as none. Patient reports  vaginal bleeding as none. Patient describes fluid per vagina as None.  Vitals:  Blood pressure 114/53, pulse 120, temperature 98.6 F (37 C), temperature source Oral, resp. rate 15, height 5\' 2"  (1.575 m), weight 77.111 kg (170 lb), last menstrual period 03/20/2014, SpO2 96 %. Physical Examination:  General appearance - alert, well appearing, and in no distress, ill-appearing, chronically ill appearing and crying Heart - normal rate and regular rhythm Abdomen - soft, nontender, nondistended Fundal Height:  size equals dates Cervical Exam: Not evaluated.  Extremities: extremities normal, atraumatic, no cyanosis or edema and Homans sign is negative, no sign of DVT with DTRs 2+ bilaterally knee is not hot, no palpable effusion. Calves negative Membranes:intact  Fetal Monitoring:  Baseline: 145 bpm, Variability: Good {> 6 bpm), Accelerations: Reactive and Decelerations: Absent  Labs:  Results for orders placed or performed during the hospital encounter of 11/26/14 (from the past 24 hour(s))  Comprehensive metabolic panel   Collection Time: 11/26/14  7:38 PM  Result Value Ref Range   Sodium 133 (L) 135 - 145 mmol/L   Potassium 3.9 3.5 - 5.1 mmol/L   Chloride 109 101 - 111 mmol/L   CO2 20 (L) 22 - 32 mmol/L   Glucose, Bld 98 65 - 99 mg/dL   BUN 6 6 - 20 mg/dL    Creatinine, Ser 0.45 0.44 - 1.00 mg/dL   Calcium 8.3 (L) 8.9 - 10.3 mg/dL   Total Protein 6.8 6.5 - 8.1 g/dL   Albumin 3.3 (L) 3.5 - 5.0 g/dL   AST 20 15 - 41 U/L   ALT 16 14 - 54 U/L   Alkaline Phosphatase 94 38 - 126 U/L   Total Bilirubin 2.2 (H) 0.3 - 1.2 mg/dL   GFR calc non Af Amer >60 >60 mL/min   GFR calc Af Amer >60 >60 mL/min   Anion gap 4 (L) 5 - 15  CBC with Differential/Platelet   Collection Time: 11/26/14  7:38 PM  Result Value Ref Range   WBC 17.2 (H) 4.0 - 10.5 K/uL   RBC 3.33 (L) 3.87 - 5.11 MIL/uL   Hemoglobin 10.1 (L) 12.0 - 15.0 g/dL   HCT 29.1 (L) 36.0 - 46.0 %   MCV 87.4 78.0 - 100.0 fL   MCH 30.3 26.0 - 34.0 pg   MCHC 34.7 30.0 - 36.0 g/dL   RDW 20.6 (H) 11.5 - 15.5 %   Platelets 348 150 - 400 K/uL   Neutrophils Relative % 78 (H) 43 - 77 %   Lymphocytes Relative 13 12 - 46 %   Monocytes Relative 8 3 - 12 %   Eosinophils Relative 1 0 - 5 %   Basophils Relative 0 0 - 1 %   Other 0 %   Neutro Abs 13.4 (H) 1.7 - 7.7 K/uL   Lymphs Abs 2.2 0.7 -  4.0 K/uL   Monocytes Absolute 1.4 (H) 0.1 - 1.0 K/uL   Eosinophils Absolute 0.2 0.0 - 0.7 K/uL   Basophils Absolute 0.0 0.0 - 0.1 K/uL   RBC Morphology TARGET CELLS    Smear Review LARGE PLATELETS PRESENT   Reticulocytes   Collection Time: 11/26/14  7:38 PM  Result Value Ref Range   Retic Ct Pct 16.6 (H) 0.4 - 3.1 %   RBC. 3.33 (L) 3.87 - 5.11 MIL/uL   Retic Count, Manual 552.8 (H) 19.0 - 186.0 K/uL  Lactate dehydrogenase   Collection Time: 11/26/14  7:38 PM  Result Value Ref Range   LDH 179 98 - 192 U/L  Urinalysis, Routine w reflex microscopic (not at [facility])   Collection Time: 11/26/14  9:48 PM  Result Value Ref Range   Color, Urine YELLOW YELLOW   APPearance HAZY (A) CLEAR   Specific Gravity, Urine 1.010 1.005 - 1.030   pH 6.5 5.0 - 8.0   Glucose, UA NEGATIVE NEGATIVE mg/dL   Hgb urine dipstick NEGATIVE NEGATIVE   Bilirubin Urine NEGATIVE NEGATIVE   Ketones, ur NEGATIVE NEGATIVE mg/dL   Protein, ur  NEGATIVE NEGATIVE mg/dL   Urobilinogen, UA 1.0 0.0 - 1.0 mg/dL   Nitrite NEGATIVE NEGATIVE   Leukocytes, UA TRACE (A) NEGATIVE  Urine microscopic-add on   Collection Time: 11/26/14  9:48 PM  Result Value Ref Range   Squamous Epithelial / LPF FEW (A) RARE   WBC, UA 3-6 <3 WBC/hpf   Bacteria, UA FEW (A) RARE   Urine-Other MUCOUS PRESENT   CBC   Collection Time: 11/27/14  5:55 AM  Result Value Ref Range   WBC 17.5 (H) 4.0 - 10.5 K/uL   RBC 3.07 (L) 3.87 - 5.11 MIL/uL   Hemoglobin 9.4 (L) 12.0 - 15.0 g/dL   HCT 27.0 (L) 36.0 - 46.0 %   MCV 87.9 78.0 - 100.0 fL   MCH 30.6 26.0 - 34.0 pg   MCHC 34.8 30.0 - 36.0 g/dL   RDW 20.7 (H) 11.5 - 15.5 %   Platelets 332 150 - 400 K/uL  Reticulocytes   Collection Time: 11/27/14  5:55 AM  Result Value Ref Range   Retic Ct Pct 18.1 (H) 0.4 - 3.1 %   RBC. 3.07 (L) 3.87 - 5.11 MIL/uL   Retic Count, Manual 555.7 (H) 19.0 - 186.0 K/uL    Imaging Studies:     Currently EPIC will not allow sonographic studies to automatically populate into notes.  In the meantime, copy and paste results into note or free text.  Medications:  Scheduled . docusate sodium  100 mg Oral Daily  . folic acid  2 mg Oral Daily  . HYDROmorphone PCA 0.3 mg/mL   Intravenous 6 times per day  . prenatal multivitamin  1 tablet Oral Q1200   I have reviewed the patient's current medications.  ASSESSMENT: Patient Active Problem List   Diagnosis Date Noted  . Supervision of high-risk pregnancy 06/11/2014    Priority: Low  . Sickle cell pain crisis 11/26/2014  . Hypokalemia 10/29/2014  . Line sepsis 10/27/2014  . Sickle cell crisis 10/23/2014  . 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 Chronic pain 02/26/2013  . Anemia of chronic disease 10/05/2012  . 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 Mood swings 11/08/2011  . Migraines 11/08/2011  . Overweight(278.02) 05/24/2011  . GERD (gastroesophageal reflux disease) 02/17/2011  . Depression 01/06/2011  . Back  pain 09/17/2010  . Sickle cell disease 01/08/2009  .  Trichotillomania 01/08/2009    PLAN: Sickle cell pain crisis, will continue to hydrate, focus on pain control  Increasing dilaudid for breakthru to 2mg  q2h.  Hgb high at 10 on admit , so i doubt transfusion planned , will have sickle cell team consulted. Still planing for delivery at 37wk once crisis resolved.  Daquarius Dubeau V 11/27/2014,7:48 AM

## 2014-11-28 DIAGNOSIS — D5700 Hb-SS disease with crisis, unspecified: Secondary | ICD-10-CM

## 2014-11-28 LAB — CBC
HCT: 23.9 % — ABNORMAL LOW (ref 36.0–46.0)
Hemoglobin: 8.2 g/dL — ABNORMAL LOW (ref 12.0–15.0)
MCH: 30.5 pg (ref 26.0–34.0)
MCHC: 34.3 g/dL (ref 30.0–36.0)
MCV: 88.8 fL (ref 78.0–100.0)
Platelets: 288 10*3/uL (ref 150–400)
RBC: 2.69 MIL/uL — ABNORMAL LOW (ref 3.87–5.11)
RDW: 21.1 % — ABNORMAL HIGH (ref 11.5–15.5)
WBC: 17.5 10*3/uL — ABNORMAL HIGH (ref 4.0–10.5)

## 2014-11-28 LAB — HEMOGLOBINOPATHY EVALUATION
HGB F QUANT: 4.3 % — AB (ref 0.0–2.0)
Hgb A2 Quant: 3.4 % — ABNORMAL HIGH (ref 0.7–3.1)
Hgb A: 41.9 % — ABNORMAL LOW (ref 94.0–98.0)
Hgb C: 0 %
Hgb S Quant: 50.4 % — ABNORMAL HIGH

## 2014-11-28 IMAGING — CR DG CHEST 2V
2 series · 2 of 2 positions shown · non-contrast
Comparison: PA and lateral chest of November 25, 2013

CLINICAL DATA: Chest pain and sickle cell crisis

EXAM:
CHEST  2 VIEW

[w chest pa]
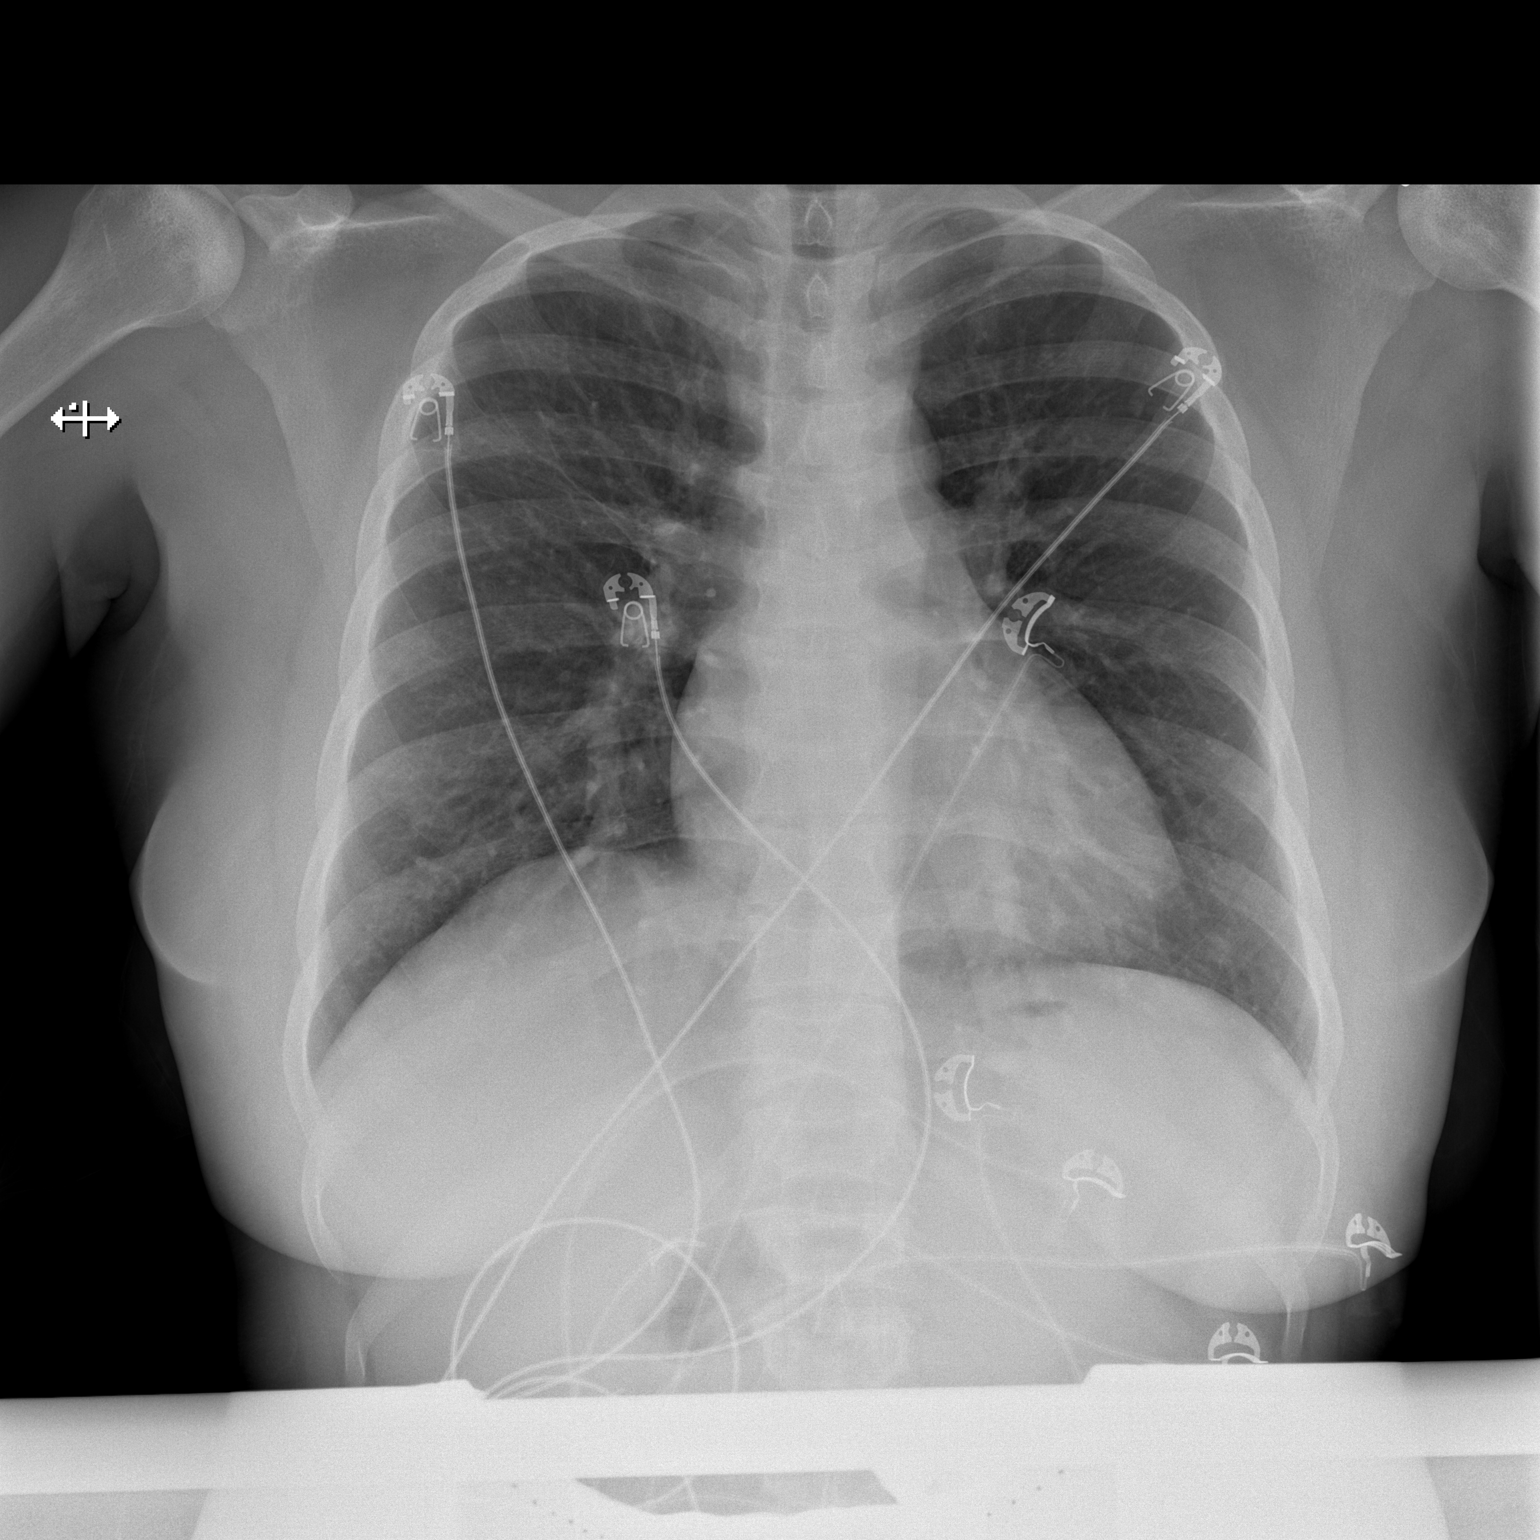

[w chest lat]
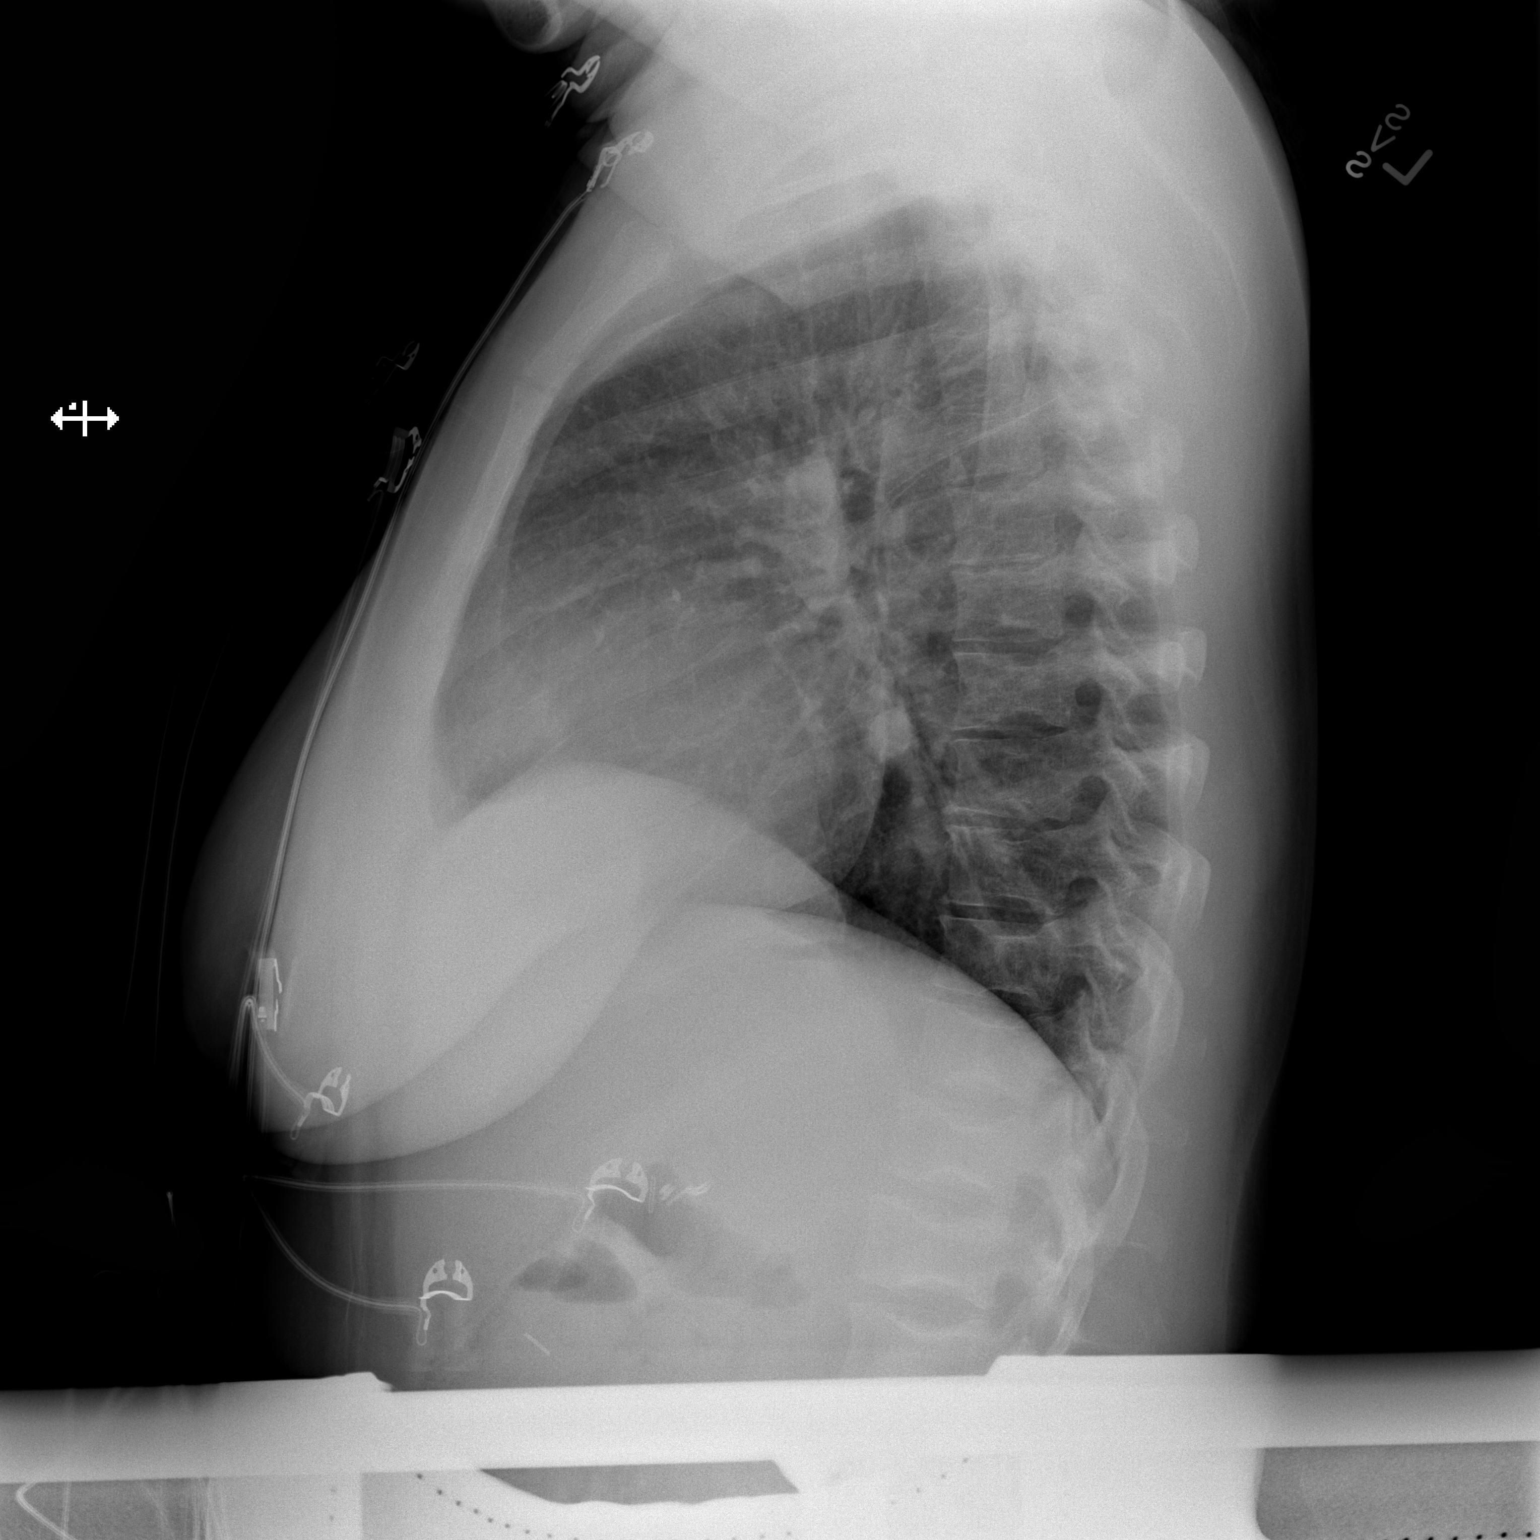

[2 of 2 positions shown; findings below may reference images not displayed]

FINDINGS: The lungs are adequately inflated. There is no focal infiltrate. The
heart and pulmonary vascularity exhibit no acute changes. There is
stable central pulmonary vascular prominence. There is no pleural
effusion. There are chronic vertebral body and humeral head changes
consistent with sickle cell disease.
IMPRESSION: There is no evidence of pulmonary edema or alveolar pneumonia. There
are mild stable changes consistent with the sequelae of sickle cell
disease.

## 2014-11-28 MED ORDER — DIPHENHYDRAMINE HCL 50 MG/ML IJ SOLN
12.5000 mg | Freq: Once | INTRAMUSCULAR | Status: AC
  Administered 2014-11-28: 12.5 mg via INTRAVENOUS
  Filled 2014-11-28: qty 1

## 2014-11-28 MED ORDER — SODIUM CHLORIDE 0.9 % IJ SOLN
10.0000 mL | INTRAMUSCULAR | Status: DC | PRN
  Administered 2014-11-28 – 2014-11-30 (×3): 10 mL via INTRAVENOUS
  Filled 2014-11-28 (×3): qty 10

## 2014-11-28 MED ORDER — BUTALBITAL-APAP-CAFFEINE 50-325-40 MG PO TABS
1.0000 | ORAL_TABLET | Freq: Once | ORAL | Status: AC
  Administered 2014-11-28: 1 via ORAL
  Filled 2014-11-28: qty 1

## 2014-11-28 NOTE — Care Management Important Message (Signed)
Important Message  Patient Details  Name: [name] MRN: [id number] Date of Birth: May 11, 1991   Medicare Important Message Given:       Shelda Altes 11/28/2014, 10:31 AMImportant Message  Patient Details  Name: [name] MRN: [id number] Date of Birth: 01-09-1991   Medicare Important Message Given:       Shelda Altes 11/28/2014, 10:30 AM

## 2014-11-28 NOTE — Progress Notes (Signed)
Patient ID: [name], female   DOB: [birth year], 24 y.o.   MRN: [id number] [place]) NOTE  [name] is a 24 y.o. G2P0010 at [redacted]w[redacted]d by best clinical estimate who is admitted for sickle cell crisis involving heels and right knee.   Fetal presentation is unsure. Length of Stay:  2  Days  Subjective: Pt reports continued right knee discomfort which she describes as soreness. The pain worsen as she tries to ambulate but reports that she is making an effort to get better. She reports persistent pruritis not relieved by oral benadryl Patient reports the fetal movement as active. Patient reports uterine contraction  activity as none. Patient reports  vaginal bleeding as none. Patient describes fluid per vagina as None.  Vitals:  Blood pressure 108/59, pulse 85, temperature 98.7 F (37.1 C), temperature source Oral, resp. rate 14, height 5\' 2"  (1.575 m), weight 170 lb (77.111 kg), last menstrual period 03/20/2014, SpO2 96 %. Physical Examination:  General appearance - alert, well appearing, and in no distress, ill-appearing, chronically ill appearing and crying Heart - normal rate and regular rhythm Abdomen - soft, nontender, nondistended Fundal Height:  size equals dates Cervical Exam: Not evaluated.  Extremities: extremities normal, atraumatic, no cyanosis or edema and Homans sign is negative, no sign of DVT with DTRs 2+ bilaterally knee is not hot, no palpable effusion. Calves negative Membranes:intact  Fetal Monitoring:  Baseline: 130 bpm, Variability: Good {> 6 bpm), Accelerations: Reactive, Decelerations: Absent and Toco: no contractions  Labs:  Results for orders placed or performed during the hospital encounter of 11/26/14 (from the past 24 hour(s))  CBC   Collection Time: 11/28/14  5:25 AM  Result Value Ref Range   WBC PENDING 4.0 - 10.5 K/uL   RBC 2.69 (L) 3.87 - 5.11 MIL/uL   Hemoglobin 8.2 (L) 12.0 - 15.0 g/dL   HCT 23.9 (L) 36.0 -  46.0 %   MCV 88.8 78.0 - 100.0 fL   MCH 30.5 26.0 - 34.0 pg   MCHC 34.3 30.0 - 36.0 g/dL   RDW 21.1 (H) 11.5 - 15.5 %   Platelets PENDING 150 - 400 K/uL    Imaging Studies:     Currently EPIC will not allow sonographic studies to automatically populate into notes.  In the meantime, copy and paste results into note or free text.  Medications:  Scheduled . docusate sodium  100 mg Oral Daily  . folic acid  2 mg Oral Daily  . HYDROmorphone PCA 0.3 mg/mL   Intravenous 6 times per day  . polyethylene glycol  17 g Oral Daily  . prenatal multivitamin  1 tablet Oral Q1200   I have reviewed the patient's current medications.  ASSESSMENT: Patient Active Problem List   Diagnosis Date Noted  . Supervision of high-risk pregnancy 06/11/2014    Priority: Medium  . Sickle cell disease 01/08/2009    Priority: Medium  . Sickle cell pain crisis 11/26/2014  . Hypokalemia 10/29/2014  . Line sepsis 10/27/2014  . Sickle cell crisis 10/23/2014  . 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 Chronic pain 02/26/2013  . Anemia of chronic disease 10/05/2012  . 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 Mood swings 11/08/2011  . Migraines 11/08/2011  . Overweight(278.02) 05/24/2011  . GERD (gastroesophageal reflux disease) 02/17/2011  . Depression 01/06/2011  . Back pain 09/17/2010  . 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 Will continue to monitor  2) IUP at 36 weeks - No fetal/maternal issues - Continue current care - Delivery plan at 37 weeks due to increased frequency of sickle cell crisis    Kentavius Dettore 11/28/2014,6:45 AM

## 2014-11-29 DIAGNOSIS — Z3A36 36 weeks gestation of pregnancy: Secondary | ICD-10-CM

## 2014-11-29 DIAGNOSIS — O99013 Anemia complicating pregnancy, third trimester: Principal | ICD-10-CM

## 2014-11-29 LAB — CBC
HCT: 23.7 % — ABNORMAL LOW (ref 36.0–46.0)
Hemoglobin: 8 g/dL — ABNORMAL LOW (ref 12.0–15.0)
MCH: 29.9 pg (ref 26.0–34.0)
MCHC: 33.8 g/dL (ref 30.0–36.0)
MCV: 88.4 fL (ref 78.0–100.0)
Platelets: 291 10*3/uL (ref 150–400)
RBC: 2.68 MIL/uL — ABNORMAL LOW (ref 3.87–5.11)
RDW: 21.2 % — AB (ref 11.5–15.5)
WBC: 15 10*3/uL — AB (ref 4.0–10.5)

## 2014-11-29 NOTE — Progress Notes (Signed)
Patient ID: MACKAYLA MULLINS, female   DOB: 11-01-1990, 24 y.o.   MRN: [id number] [place]) NOTE  CATORI PANOZZO is a 24 y.o. G2P0010 at [redacted]w[redacted]d by best clinical estimate who is admitted for sickle cell crisis involving heels and right knee.   Fetal presentation is unsure. Length of Stay:  3  Days  Subjective: Pt reports continued right knee discomfort which she describes as soreness. The pain is improving some - worse with ambulation and standing. Patient's only able to tolerate minimal pressure on it. She has been trying to walk more.  Patient reports the fetal movement as active. Patient reports uterine contraction  activity as none. Patient reports  vaginal bleeding as none. Patient describes fluid per vagina as None.  Vitals:  Blood pressure 101/80, pulse 94, temperature 98.3 F (36.8 C), temperature source Oral, resp. rate 15, height 5\' 2"  (1.575 m), weight 170 lb (77.111 kg), last menstrual period 03/20/2014, SpO2 96 %. Physical Examination:  General appearance - alert, well appearing, and in no distress, ill-appearing, chronically ill appearing and crying Heart - normal rate and regular rhythm. No murmur auscultated Lungs - clear to auscultation with normal respiratory effort area no wheezes, rales, rhonchi Abdomen - soft, nontender, nondistended Fundal Height:  size equals dates Cervical Exam: Not evaluated.  Extremities: extremities normal, atraumatic, no cyanosis or edema and Homans sign is negative, no sign of DVT.  Right knee is not erythematous. No infusion. No warmth to touch. Tender to palpation with slight touch. Membranes:intact  Fetal Monitoring:  Baseline: 130 bpm, Variability: Good {> 6 bpm), Accelerations: Reactive, Decelerations: Absent and Toco: no contractions  Labs:  Results for orders placed or performed during the hospital encounter of 11/26/14 (from the past 24 hour(s))  CBC   Collection Time: 11/29/14  6:00 AM  Result  Value Ref Range   WBC 15.0 (H) 4.0 - 10.5 K/uL   RBC 2.68 (L) 3.87 - 5.11 MIL/uL   Hemoglobin 8.0 (L) 12.0 - 15.0 g/dL   HCT 23.7 (L) 36.0 - 46.0 %   MCV 88.4 78.0 - 100.0 fL   MCH 29.9 26.0 - 34.0 pg   MCHC 33.8 30.0 - 36.0 g/dL   RDW 21.2 (H) 11.5 - 15.5 %   Platelets 291 150 - 400 K/uL    Imaging Studies:     Currently EPIC will not allow sonographic studies to automatically populate into notes.  In the meantime, copy and paste results into note or free text.  Medications:  Scheduled . docusate sodium  100 mg Oral Daily  . folic acid  2 mg Oral Daily  . HYDROmorphone PCA 0.3 mg/mL   Intravenous 6 times per day  . polyethylene glycol  17 g Oral Daily  . prenatal multivitamin  1 tablet Oral Q1200   I have reviewed the patient's current medications.  ASSESSMENT: Patient Active Problem List   Diagnosis Date Noted  . Sickle cell pain crisis 11/26/2014  . Hypokalemia 10/29/2014  . Line sepsis 10/27/2014  . Sickle cell crisis 10/23/2014  . 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 Supervision of high-risk pregnancy 06/11/2014  . Chronic pain 02/26/2013  . Anemia of chronic disease 10/05/2012  . 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 Mood swings 11/08/2011  . Migraines 11/08/2011  . Overweight(278.02) 05/24/2011  . GERD (gastroesophageal reflux disease) 02/17/2011  . Depression 01/06/2011  . Back pain 09/17/2010  . Sickle cell disease 01/08/2009  . 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 Will contact them for consult - Hg 8.0 this morning. Will continue to monitor  2) IUP at 36 weeks - No fetal/maternal issues - Continue current care - Delivery plan at 37 weeks due to increased frequency of sickle cell crisis    STINSON, JACOB JEHIEL 11/29/2014,7:42 AM

## 2014-11-30 LAB — CBC
HCT: 23.5 % — ABNORMAL LOW (ref 36.0–46.0)
Hemoglobin: 7.9 g/dL — ABNORMAL LOW (ref 12.0–15.0)
MCH: 30 pg (ref 26.0–34.0)
MCHC: 33.6 g/dL (ref 30.0–36.0)
MCV: 89.4 fL (ref 78.0–100.0)
PLATELETS: 276 10*3/uL (ref 150–400)
RBC: 2.63 MIL/uL — ABNORMAL LOW (ref 3.87–5.11)
RDW: 21.4 % — AB (ref 11.5–15.5)
WBC: 13.8 10*3/uL — ABNORMAL HIGH (ref 4.0–10.5)

## 2014-11-30 LAB — PREPARE RBC (CROSSMATCH)

## 2014-11-30 MED ORDER — SODIUM CHLORIDE 0.9 % IV SOLN
Freq: Once | INTRAVENOUS | Status: AC
  Administered 2014-11-30: 15:00:00 via INTRAVENOUS

## 2014-11-30 MED ORDER — DIPHENHYDRAMINE HCL 25 MG PO CAPS
25.0000 mg | ORAL_CAPSULE | Freq: Once | ORAL | Status: AC
  Administered 2014-11-30: 25 mg via ORAL
  Filled 2014-11-30: qty 1

## 2014-11-30 MED ORDER — ACETAMINOPHEN 325 MG PO TABS
650.0000 mg | ORAL_TABLET | Freq: Once | ORAL | Status: AC
  Administered 2014-11-30: 650 mg via ORAL
  Filled 2014-11-30: qty 2

## 2014-11-30 NOTE — Progress Notes (Signed)
Patient ID: [name], female   DOB: Sep 12, 1990, 24 y.o.   MRN: [id number] [place]) NOTE  [name] is a 24 y.o. G2P0010 at [redacted]w[redacted]d by LMP, early ultrasound who is admitted for sickle cell crisis.   Fetal presentation is cephalic. Length of Stay:  4  Days  Subjective: Pt feels better than before, will be a candidate for going home tomorrow. Patient reports the fetal movement as active. Patient reports uterine contraction  activity as none. Patient reports  vaginal bleeding as none. Patient describes fluid per vagina as None.  Vitals:  Blood pressure 95/62, pulse 107, temperature 98.2 F (36.8 C), temperature source Oral, resp. rate 16, height 5\' 2"  (1.575 m), weight 170 lb (77.111 kg), last menstrual period 03/20/2014, SpO2 97 %. Physical Examination:  General appearance - alert, well appearing, and in no distress, oriented to person, place, and time and normal appearing weight Heart - normal rate and regular rhythm Abdomen - soft, nontender, nondistended Fundal Height:  size equals dates Cervical Exam: Not evaluated. and f Extremities: extremities normal, atraumatic, no cyanosis or edema and Homans sign is negative, no sign of DVT with DTRs 2+ bilaterally Membranes:intact  Fetal Monitoring:  Twice daily NST's  Labs:  Results for orders placed or performed during the hospital encounter of 11/26/14 (from the past 24 hour(s))  CBC   Collection Time: 11/30/14  5:53 AM  Result Value Ref Range   WBC 13.8 (H) 4.0 - 10.5 K/uL   RBC 2.63 (L) 3.87 - 5.11 MIL/uL   Hemoglobin 7.9 (L) 12.0 - 15.0 g/dL   HCT 23.5 (L) 36.0 - 46.0 %   MCV 89.4 78.0 - 100.0 fL   MCH 30.0 26.0 - 34.0 pg   MCHC 33.6 30.0 - 36.0 g/dL   RDW 21.4 (H) 11.5 - 15.5 %   Platelets 276 150 - 400 K/uL    Imaging Studies:     Currently EPIC will not allow sonographic studies to automatically populate into notes.  In the meantime, copy and paste results into note or free  text.  Medications:  Scheduled . docusate sodium  100 mg Oral Daily  . folic acid  2 mg Oral Daily  . HYDROmorphone PCA 0.3 mg/mL   Intravenous 6 times per day  . polyethylene glycol  17 g Oral Daily  . prenatal multivitamin  1 tablet Oral Q1200   I have reviewed the patient's current medications.  ASSESSMENT: Patient Active Problem List   Diagnosis Date Noted  . Supervision of high-risk pregnancy 06/11/2014    Priority: Low  . Sickle cell pain crisis 11/26/2014  . Hypokalemia 10/29/2014  . Line sepsis 10/27/2014  . Sickle cell crisis 10/23/2014  . 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 Chronic pain 02/26/2013  . Anemia of chronic disease 10/05/2012  . 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 Mood swings 11/08/2011  . Migraines 11/08/2011  . Overweight(278.02) 05/24/2011  . GERD (gastroesophageal reflux disease) 02/17/2011  . Depression 01/06/2011  . Back pain 09/17/2010  . Sickle cell disease 01/08/2009  . 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 Dr Jonelle Sidle consulted and he agrees with the plan.  [name] V 11/30/2014,11:39 AM

## 2014-12-01 LAB — TYPE AND SCREEN
ABO/RH(D): B POS
ANTIBODY SCREEN: NEGATIVE
DONOR AG TYPE: NEGATIVE
DONOR AG TYPE: NEGATIVE
UNIT DIVISION: 0
UNIT DIVISION: 0
UNIT TAG COMMENT: NEGATIVE
Unit tag comment: NEGATIVE

## 2014-12-01 LAB — CBC
HCT: 31 % — ABNORMAL LOW (ref 36.0–46.0)
Hemoglobin: 10.6 g/dL — ABNORMAL LOW (ref 12.0–15.0)
MCH: 30.5 pg (ref 26.0–34.0)
MCHC: 34.2 g/dL (ref 30.0–36.0)
MCV: 89.3 fL (ref 78.0–100.0)
Platelets: 301 10*3/uL (ref 150–400)
RBC: 3.47 MIL/uL — ABNORMAL LOW (ref 3.87–5.11)
RDW: 19.5 % — ABNORMAL HIGH (ref 11.5–15.5)
WBC: 14.9 10*3/uL — ABNORMAL HIGH (ref 4.0–10.5)

## 2014-12-01 MED ORDER — SODIUM CHLORIDE 0.9 % IJ SOLN
10.0000 mL | Freq: Two times a day (BID) | INTRAMUSCULAR | Status: DC
  Administered 2014-12-01: 10 mL
  Filled 2014-12-01 (×3): qty 40

## 2014-12-01 MED ORDER — METHADONE HCL 5 MG PO TABS: 5.0000 mg | ORAL_TABLET | Freq: Every day | ORAL | Status: DC

## 2014-12-01 MED ORDER — SODIUM CHLORIDE 0.9 % IJ SOLN
10.0000 mL | INTRAMUSCULAR | Status: DC | PRN
  Administered 2014-12-01: 10 mL
  Filled 2014-12-01 (×2): qty 40

## 2014-12-01 MED ORDER — OXYCODONE-ACETAMINOPHEN 10-325 MG PO TABS: 1.0000 | ORAL_TABLET | ORAL | Status: DC | PRN

## 2014-12-01 NOTE — Discharge Summary (Signed)
Antenatal Physician Discharge Summary  Patient ID: UNITA DETAMORE MRN: [id number] DOB/AGE: 23/07/1990 23 y.o.  Admit date: 11/26/2014 Discharge date: 12/01/2014  Admission Diagnoses: Sickle call pain crisis at [redacted]w[redacted]d  Discharge Diagnoses: Resolved sickle call pain crisis at [redacted]w[redacted]d  Prenatal Procedures: NST and transfusion  Significant Diagnostic Studies:  CBC Latest Ref Rng 12/01/2014 11/30/2014 11/29/2014  WBC 4.0 - 10.5 K/uL 14.9(H) 13.8(H) 15.0(H)  Hemoglobin 12.0 - 15.0 g/dL 10.6(L) 7.9(L) 8.0(L)  Hematocrit 36.0 - 46.0 % 31.0(L) 23.5(L) 23.7(L)  Platelets 150 - 400 K/uL 301 276 291   Treatments: IV hydration, Analgesics, Transfusion of 2 units of pRBCs  Hospital Course:  This is a 24 y.o. G2P0010 with IUP at [redacted]w[redacted]d admitted with sickle cell crisis at 103w0d; she has a history of frequent admissions fort episodes of crisis and opiate tolerance.  She was started on Dilaudid PCA and other oral analgesics and closely monitored. She received blood transfusion of 2 units of pRBCs. Her pain improved. Her fetal heart rate monitoring remained reassuring, and she had no signs/symptoms of preterm labor or other maternal-fetal concerns. She was deemed stable for discharge to home with outpatient follow up as arranged.  Consults: None  Discharge Condition: Stable  Disposition: 01-Home or Self Care     Medication List    STOP taking these medications        oxyCODONE 15 MG immediate release tablet  Commonly known as:  ROXICODONE      TAKE these medications        cyclobenzaprine 10 MG tablet  Commonly known as:  FLEXERIL  Take 1 tablet (10 mg total) by mouth 3 (three) times daily as needed for muscle spasms.     docusate sodium 100 MG capsule  Commonly known as:  COLACE  Take 1 capsule (100 mg total) by mouth daily.     folic acid 1 MG tablet  Commonly known as:  FOLVITE  Take 2 tablets (2 mg total) by mouth daily.     methadone 5 MG tablet  Commonly known as:   DOLOPHINE  Take 1 tablet (5 mg total) by mouth at bedtime.     oxyCODONE-acetaminophen 10-325 MG per tablet  Commonly known as:  PERCOCET  Take 1 tablet by mouth every 4 (four) hours as needed for pain.     prenatal multivitamin Tabs tablet  Take 1 tablet by mouth daily at 12 noon.           Follow-up Information    Follow up with Labor and Delivery On 12/05/2014.   Why:  7:30 am for scheduled induction of labor.  Call clinic/come to [facility] for any concerning issues      Signed: Verita Schneiders A M.D. 12/01/2014, 12:43 PM

## 2014-12-01 NOTE — Progress Notes (Signed)
CSW met with patient briefly to check in and offer support.  CSW knows patient from a prior admission.  Patient was getting ready to take a shower prior to discharge.  She reports doing well with the plan for induction on Friday.  She states she is "excited" and that her mother and sister will be with her for delivery.  She states no questions, concerns or needs at this time.

## 2014-12-03 ENCOUNTER — Encounter: Payer: Self-pay | Admitting: Family Medicine

## 2014-12-03 ENCOUNTER — Ambulatory Visit (INDEPENDENT_AMBULATORY_CARE_PROVIDER_SITE_OTHER): Payer: Medicare Other | Admitting: Family Medicine

## 2014-12-03 VITALS — BP 114/68 | HR 92 | Temp 98.1°F | Resp 16 | Ht 61.0 in | Wt 174.0 lb

## 2014-12-03 DIAGNOSIS — D571 Sickle-cell disease without crisis: Secondary | ICD-10-CM | POA: Diagnosis not present

## 2014-12-03 LAB — TYPE AND SCREEN
ABO/RH(D): B POS
ANTIBODY SCREEN: NEGATIVE
Unit division: 0
Unit division: 0

## 2014-12-03 MED ORDER — OXYCODONE HCL 15 MG PO TABS: 15.0000 mg | ORAL_TABLET | ORAL | Status: DC | PRN

## 2014-12-03 NOTE — Patient Instructions (Signed)
You have been referred to a pain clinic. You should be getting further refills after delivery from them and no more from [place]. We will cover you if you are unable to get in before your next refill needed. Remember terms of medications contract and that you are to receive narcotics no where else other than [place] until you get in with pain clinic. Store in a safe, locked place away from others. We can check on your narcotic [place].

## 2014-12-03 NOTE — Progress Notes (Signed)
Patient ID: [name], female   DOB: Mar 04, 1991, 24 y.o.   MRN: [id number]   [name], is a 24 y.o. female  EXH:[id number]  FYB:[id number]  DOB - July 09, 1990  CC:  Chief Complaint  Patient presents with  . Follow-up    scd       HPI: [name] is a 24 y.o. female here for follow-up on SCD and chronic pain. She is pregnant and is to be induced in two days. She states her pain is her usual joint pain. She has been admitted for pain control on several occassions since her last visit a month ago. She has been referred to a pain clinic. She will be scheduled for appointment in mid August.   Allergies  Allergen Reactions  . Carrot [Daucus Carota] Hives, Swelling and Rash    Dietary:  Please do not send any form of carrot to patient  . Carrot Oil Hives and Swelling  . Latex Rash   Past Medical History  Diagnosis Date  . Miscarriage 03/22/2011    Pt reports 2 miscarriages.  . Depression 01/06/2011  . GERD (gastroesophageal reflux disease) 02/17/2011  . Trichotillomania     h/o  . Blood transfusion     "lots"  . Sickle cell anemia with crisis   . Exertional dyspnea     "sometimes"  . Migraines 11/08/11    "@ least twice/month"  . Chronic back pain     "very severe; have knot in my back; from tight muscle; take RX and exercise for it"  . Mood swings 11/08/11    "I go back and forth; real bad"  . Sickle cell anemia    Current Outpatient Prescriptions on File Prior to Visit  Medication Sig Dispense Refill  . cyclobenzaprine (FLEXERIL) 10 MG tablet Take 1 tablet (10 mg total) by mouth 3 (three) times daily as needed for muscle spasms. 30 tablet 5  . docusate sodium (COLACE) 100 MG capsule Take 1 capsule (100 mg total) by mouth daily. (Patient taking differently: Take 100 mg by mouth daily as needed for mild constipation. ) 30 capsule prn  . folic acid (FOLVITE) 1 MG tablet Take 2 tablets (2 mg total) by mouth daily. (Patient taking differently: Take 1 mg by mouth  daily. ) 60 tablet 2  . methadone (DOLOPHINE) 5 MG tablet Take 1 tablet (5 mg total) by mouth at bedtime. 30 tablet 0  . oxyCODONE-acetaminophen (PERCOCET) 10-325 MG per tablet Take 1 tablet by mouth every 4 (four) hours as needed for pain. 20 tablet 0  . Prenatal Vit-Fe Fumarate-FA (PRENATAL MULTIVITAMIN) TABS tablet Take 1 tablet by mouth daily at 12 noon.    . [DISCONTINUED] medroxyPROGESTERone (DEPO-PROVERA) 150 MG/ML injection Inject 150 mg into the muscle every 3 (three) months. Last dose 04/20/11     No current facility-administered medications on file prior to visit.   Family History  Problem Relation Age of Onset  . Diabetes Maternal Grandmother   . Diabetes Paternal Grandmother   . Hypertension Paternal Grandmother   . Diabetes Maternal Grandfather   . Sickle cell trait Mother   . Sickle cell trait Father    History   Social History  . Marital Status: Single    Spouse Name: N/A  . Number of Children: N/A  . Years of Education: N/A   Occupational History  . Not on file.   Social History Main Topics  . Smoking status: Former Smoker -- 0.25 packs/day for 1 years  Types: Cigarettes    Quit date: 03/25/2013  . Smokeless tobacco: Never Used  . Alcohol Use: No     Comment: pt states she quit marijuan in May 2013. Rare ETOH, + cigarettes.  She is enrolled in school  . Drug Use: No  . Sexual Activity: Yes    Birth Control/ Protection: None   Other Topics Concern  . 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 Gastrointestinal: Negative for abdominal distention, abdominal pain, nausea, vomiting,  diarrhea,  Genitourinary: Denies problems Musculoskeletal: Admits to joint and back pain related to SCD. Neurological: Denies problems Hematological: Denies problems Psychiatric/Behavioral: Denies depression, anxiety.   Objective:   Filed Vitals:   12/03/14 1401  BP: 114/68  Pulse: 92  Temp: 98.1 F (36.7 C)  Resp: 16    Physical Exam: Constitutional: Patient appears well-developed and well-nourished. No distress. HENT: Normocephalic, atraumatic, External right and left ear normal. Oropharynx is clear and moist.  Eyes: Conjunctivae and EOM are normal. PERRLA, no scleral icterus. Neck: Normal ROM. Neck supple. No lymphadenopathy, No thyromegaly. CVS: RRR, S1/S2 +, no murmurs, no gallops, no rubs Pulmonary: Effort and breath sounds normal, no stridor, rhonchi, wheezes, rales.   Musculoskeletal: Normal range of motion. No edema and no tenderness.  Neuro: Alert.Normal muscle tone coordination. Non-focal Skin: Skin is warm and dry. No rash noted. Not diaphoretic. No erythema. No pallor. Psychiatric: Normal mood and affect. Behavior, judgment, thought content normal.  Lab Results  Component Value Date   WBC 14.9* 12/01/2014   HGB 10.6* 12/01/2014   HCT 31.0* 12/01/2014   MCV 89.3 12/01/2014   PLT 301 12/01/2014   Lab Results  Component Value Date   CREATININE 0.45 11/26/2014   BUN 6 11/26/2014   NA 133* 11/26/2014   K 3.9 11/26/2014   CL 109 11/26/2014   CO2 20* 11/26/2014    No results found for: HGBA1C Lipid Panel  No results found for: CHOL, TRIG, HDL, CHOLHDL, VLDL, LDLCALC     Assessment and plan:   SCD with chronic pain -Refill of oxycodone 15 mg #90 one po q 6 hours prn severe pain; -Has appointment pending with pain clinic in mid august.  Follow-up:  Will call for appointment in a couple of weeks after delivery.  The patient was given clear instructions to go to ER or return to medical center if symptoms don't improve, worsen or new problems develop.  The patient verbalized understanding. The patient was told to call to get lab results if they haven't heard anything in the next week.       Micheline Chapman, MSN, FNP-BC   12/03/2014, 2:13 PM

## 2014-12-04 ENCOUNTER — Encounter: Payer: Self-pay | Admitting: Advanced Practice Midwife

## 2014-12-04 ENCOUNTER — Inpatient Hospital Stay (HOSPITAL_COMMUNITY): Admit: 2014-12-04 | Payer: Medicare Other

## 2014-12-04 ENCOUNTER — Encounter: Payer: Self-pay | Admitting: Student

## 2014-12-05 ENCOUNTER — Inpatient Hospital Stay (HOSPITAL_COMMUNITY): Admit: 2014-12-05 | Payer: Medicare Other | Source: Other Acute Inpatient Hospital | Admitting: Family Medicine

## 2014-12-05 ENCOUNTER — Inpatient Hospital Stay (HOSPITAL_COMMUNITY)
Admission: AD | Admit: 2014-12-05 | Discharge: 2014-12-10 | DRG: 775 | Disposition: A | Discharge status: Home / Self Care | Payer: Medicare Other | Source: Ambulatory Visit | Attending: Obstetrics & Gynecology | Admitting: Obstetrics & Gynecology

## 2014-12-05 VITALS — BP 115/54 | HR 83 | Temp 98.5°F | Resp 18 | Ht 61.0 in | Wt 174.0 lb

## 2014-12-05 DIAGNOSIS — Z3A37 37 weeks gestation of pregnancy: Secondary | ICD-10-CM | POA: Diagnosis not present

## 2014-12-05 DIAGNOSIS — Z23 Encounter for immunization: Secondary | ICD-10-CM

## 2014-12-05 DIAGNOSIS — D5700 Hb-SS disease with crisis, unspecified: Secondary | ICD-10-CM | POA: Diagnosis not present

## 2014-12-05 DIAGNOSIS — D72829 Elevated white blood cell count, unspecified: Secondary | ICD-10-CM | POA: Diagnosis not present

## 2014-12-05 DIAGNOSIS — O9902 Anemia complicating childbirth: Principal | ICD-10-CM | POA: Diagnosis present

## 2014-12-05 DIAGNOSIS — Z3483 Encounter for supervision of other normal pregnancy, third trimester: Secondary | ICD-10-CM | POA: Diagnosis not present

## 2014-12-05 DIAGNOSIS — D571 Sickle-cell disease without crisis: Secondary | ICD-10-CM | POA: Diagnosis not present

## 2014-12-05 DIAGNOSIS — O0993 Supervision of high risk pregnancy, unspecified, third trimester: Secondary | ICD-10-CM

## 2014-12-05 LAB — CBC
HCT: 28.5 % — ABNORMAL LOW (ref 36.0–46.0)
Hemoglobin: 9.7 g/dL — ABNORMAL LOW (ref 12.0–15.0)
MCH: 30.1 pg (ref 26.0–34.0)
MCHC: 34 g/dL (ref 30.0–36.0)
MCV: 88.5 fL (ref 78.0–100.0)
PLATELETS: 292 10*3/uL (ref 150–400)
RBC: 3.22 MIL/uL — AB (ref 3.87–5.11)
RDW: 17.9 % — ABNORMAL HIGH (ref 11.5–15.5)
WBC: 13.7 10*3/uL — AB (ref 4.0–10.5)

## 2014-12-05 MED ORDER — LACTATED RINGERS IV SOLN
500.0000 mL | INTRAVENOUS | Status: DC | PRN
  Administered 2014-12-06 – 2014-12-07 (×3): 500 mL via INTRAVENOUS
  Administered 2014-12-07: 1000 mL via INTRAVENOUS

## 2014-12-05 MED ORDER — OXYCODONE-ACETAMINOPHEN 5-325 MG PO TABS: 1.0000 | ORAL_TABLET | ORAL | Status: DC | PRN

## 2014-12-05 MED ORDER — OXYCODONE HCL 5 MG PO TABS: 15.0000 mg | ORAL_TABLET | Freq: Once | ORAL | Status: DC

## 2014-12-05 MED ORDER — CITRIC ACID-SODIUM CITRATE 334-500 MG/5ML PO SOLN: 30.0000 mL | ORAL | Status: DC | PRN

## 2014-12-05 MED ORDER — SODIUM CHLORIDE 0.9 % IJ SOLN
10.0000 mL | INTRAMUSCULAR | Status: DC | PRN
  Administered 2014-12-05 – 2014-12-08 (×3): 10 mL
  Administered 2014-12-10: 20 mL
  Filled 2014-12-05 (×15): qty 40

## 2014-12-05 MED ORDER — SODIUM CHLORIDE 0.9 % IJ SOLN
10.0000 mL | Freq: Two times a day (BID) | INTRAMUSCULAR | Status: DC
  Administered 2014-12-05 – 2014-12-06 (×4): 10 mL
  Administered 2014-12-07: 20 mL
  Administered 2014-12-07 – 2014-12-10 (×5): 10 mL
  Filled 2014-12-05 (×3): qty 40

## 2014-12-05 MED ORDER — ONDANSETRON HCL 4 MG/2ML IJ SOLN
4.0000 mg | Freq: Four times a day (QID) | INTRAMUSCULAR | Status: DC | PRN
  Administered 2014-12-06 – 2014-12-07 (×2): 4 mg via INTRAVENOUS
  Filled 2014-12-05 (×2): qty 2

## 2014-12-05 MED ORDER — METHADONE HCL 5 MG PO TABS
5.0000 mg | ORAL_TABLET | ORAL | Status: DC
  Administered 2014-12-05 – 2014-12-09 (×5): 5 mg via ORAL
  Filled 2014-12-05 (×5): qty 1

## 2014-12-05 MED ORDER — MISOPROSTOL 25 MCG QUARTER TABLET
25.0000 ug | ORAL_TABLET | ORAL | Status: DC | PRN
  Administered 2014-12-05 – 2014-12-06 (×4): 25 ug via VAGINAL
  Filled 2014-12-05 (×5): qty 0.25
  Filled 2014-12-05: qty 1

## 2014-12-05 MED ORDER — OXYCODONE HCL 5 MG PO TABS
15.0000 mg | ORAL_TABLET | ORAL | Status: DC | PRN
  Administered 2014-12-05 – 2014-12-10 (×5): 15 mg via ORAL
  Filled 2014-12-05 (×5): qty 3

## 2014-12-05 MED ORDER — ACETAMINOPHEN 325 MG PO TABS: 650.0000 mg | ORAL_TABLET | ORAL | Status: DC | PRN

## 2014-12-05 MED ORDER — OXYTOCIN 40 UNITS IN LACTATED RINGERS INFUSION - SIMPLE MED
62.5000 mL/h | INTRAVENOUS | Status: DC
  Filled 2014-12-05: qty 1000

## 2014-12-05 MED ORDER — OXYCODONE HCL 5 MG PO TABS
10.0000 mg | ORAL_TABLET | ORAL | Status: DC | PRN
  Administered 2014-12-05 (×2): 10 mg via ORAL
  Filled 2014-12-05 (×2): qty 2

## 2014-12-05 MED ORDER — LACTATED RINGERS IV SOLN
INTRAVENOUS | Status: DC
  Administered 2014-12-05 – 2014-12-08 (×9): via INTRAVENOUS

## 2014-12-05 MED ORDER — FLEET ENEMA 7-19 GM/118ML RE ENEM: 1.0000 | ENEMA | RECTAL | Status: DC | PRN

## 2014-12-05 MED ORDER — METHADONE HCL 5 MG PO TABS: 5.0000 mg | ORAL_TABLET | Freq: Every day | ORAL | Status: DC

## 2014-12-05 MED ORDER — OXYCODONE-ACETAMINOPHEN 5-325 MG PO TABS
2.0000 | ORAL_TABLET | ORAL | Status: DC | PRN
Start: 2014-12-05 — End: 2014-12-07

## 2014-12-05 MED ORDER — LIDOCAINE HCL (PF) 1 % IJ SOLN
30.0000 mL | INTRAMUSCULAR | Status: DC | PRN
Start: 2014-12-05 — End: 2014-12-08
  Filled 2014-12-05: qty 30

## 2014-12-05 MED ORDER — TERBUTALINE SULFATE 1 MG/ML IJ SOLN: 0.2500 mg | Freq: Once | INTRAMUSCULAR | Status: AC | PRN

## 2014-12-05 MED ORDER — OXYTOCIN BOLUS FROM INFUSION
500.0000 mL | INTRAVENOUS | Status: DC
Start: 2014-12-05 — End: 2014-12-08

## 2014-12-05 NOTE — Progress Notes (Signed)
Leotis Shames RN, house coverage at bedside for PICC flush and Lab draw for routine labs.  Both ports flushed easily and labs drawn accordingly. LR connected and running at 125 cc/hr.  Brigitte Pulse RN contacted IV team regarding dressing needed to be changed today.  Informed dressing will be changed this afternoon.

## 2014-12-05 NOTE — H&P (Signed)
[name] is a 24 y.o. female presenting for Induction of Labor due to increased frequency of sickle crises.  Was here last week for knee pain and recently discharged.  Describes pain in side and back which radiates to legs.   Maternal Medical History:  Reason for admission: Nausea. Induction of labor   Contractions: Onset was less than 1 hour ago.   Frequency: rare.   Perceived severity is mild.    Fetal activity: Perceived fetal activity is normal.   Last perceived fetal movement was within the past hour.    Prenatal complications: No bleeding, placental abnormality or pre-eclampsia.   Sickle Cell Disease  Prenatal Complications - Diabetes: none.    OB History    Gravida Para Term Preterm AB TAB SAB Ectopic Multiple Living   2    1  1    0      Obstetric Comments   Miscarried in October 2012 at about 7 weeks     Past Medical History  Diagnosis Date  . Miscarriage 03/22/2011    Pt reports 2 miscarriages.  . Depression 01/06/2011  . GERD (gastroesophageal reflux disease) 02/17/2011  . Trichotillomania     h/o  . Blood transfusion     "lots"  . Sickle cell anemia with crisis   . Exertional dyspnea     "sometimes"  . Migraines 11/08/11    "@ least twice/month"  . Chronic back pain     "very severe; have knot in my back; from tight muscle; take RX and exercise for it"  . Mood swings 11/08/11    "I go back and forth; real bad"  . Sickle cell anemia    Past Surgical History  Procedure Laterality Date  . Cholecystectomy  05/2010  . Dilation and curettage of uterus  02/20/11    S/P miscarriage   Family History: family history includes Diabetes in her maternal grandfather, maternal grandmother, and paternal grandmother; Hypertension in her paternal grandmother; Sickle cell trait in her father and mother. Social History:  reports that she quit smoking about 20 months ago. Her smoking use included Cigarettes. She has a .25 pack-year smoking history. She has never used  smokeless tobacco. She reports that she does not drink alcohol or use illicit drugs.   Prenatal Transfer Tool  Maternal Diabetes: No Genetic Screening: Declined Maternal Ultrasounds/Referrals: Normal Fetal Ultrasounds or other Referrals:  None Maternal Substance Abuse:  Yes:  Type: Methadone, Other:  Significant Maternal Medications:  Meds include: Other: Percocet high doses, long term Significant Maternal Lab Results:  None  GBS Negative Other Comments:  None  Review of Systems  Constitutional: Negative for fever, chills and malaise/fatigue.  Cardiovascular: Negative for chest pain.  Gastrointestinal: Negative for nausea, vomiting, abdominal pain, diarrhea and constipation.  Genitourinary: Negative for dysuria.  Musculoskeletal: Positive for back pain and joint pain.  Neurological: Negative for dizziness, focal weakness and weakness.    Dilation: Closed (2 external ) Effacement (%): 50 Station: -2 Exam by:: H Koran RNC Blood pressure 118/65, pulse 110, temperature 98.2 F (36.8 C), temperature source Oral, height 5\' 1"  (1.549 m), weight 174 lb (78.926 kg), last menstrual period 03/20/2014. Maternal Exam:  Uterine Assessment: Contraction strength is mild.  Contraction frequency is rare.   Abdomen: Patient reports no abdominal tenderness. Fundal height is 37.   Estimated fetal weight is 6.5.   Fetal presentation: vertex  Introitus: Normal vulva. Normal vagina.  Vagina is negative for discharge.  Ferning test: not done.  Nitrazine test:  not done. Amniotic fluid character: not assessed.  Pelvis: adequate for delivery.   Cervix: Cervix evaluated by digital exam.     Fetal Exam Fetal Monitor Review: Mode: ultrasound.   Baseline rate: 150.  Variability: moderate (6-25 bpm).   Pattern: accelerations present and no decelerations.    Fetal State Assessment: Category I - tracings are normal.     Physical Exam  Constitutional: She is oriented to person, place, and time.  She appears well-developed and well-nourished. No distress.  HENT:  Head: Normocephalic.  Cardiovascular: Normal rate, regular rhythm and normal heart sounds.  Exam reveals no gallop and no friction rub.   No murmur heard. Respiratory: Effort normal. No respiratory distress. She has no wheezes. She has no rales. She exhibits no tenderness.  GI: Soft. She exhibits no distension and no mass. There is no tenderness. There is no rebound and no guarding.  Genitourinary: Vagina normal. No vaginal discharge found.  Dilation: Closed Effacement (%): 50 Cervical Position: Posterior Station: -2 Presentation: Vertex Exam by:: Katheran Awe, RNC   Musculoskeletal: Normal range of motion.  Neurological: She is alert and oriented to person, place, and time.  Skin: Skin is warm and dry.  Psychiatric: She has a normal mood and affect.    Prenatal labs: ABO, Rh: --/--/B POS (07/18 7544) Antibody: NEG (07/18 0917) Rubella: 0.69 (01/27 1457) RPR: NON REAC (01/27 1457)  HBsAg: NEGATIVE (01/27 1457)  HIV: NONREACTIVE (12/21 1550)  GBS: Negative (07/07 0000)   Assessment/Plan: A:  SIUP at [redacted]w[redacted]d       Sickle Cell Disease       Frequent Sickle crises  P:  Admit to [facility]       Routine orders       Cytotec induction        WIll consult Anesthesia re: narcotic recommendations  [facility] 12/05/2014, 10:58 AM

## 2014-12-05 NOTE — Progress Notes (Signed)
Patient ID: AMAURIE SCHRECKENGOST, female   DOB: 1990/08/04, 24 y.o.   MRN: [id number] Doing well  Getting Cytotec for cervical ripening  Discussed plan of care with Sickle Team Charge Nurse, Pharmacist, and Dr Jillyn Hidden. They recommend regular narcotic to avoid withdrawal.  Discussed using PCA vs oral Oxy IR.  Will try Oxy IR at 10mg  q4 hrs to decrease total amount of narcotic exposure to baby,while maintaining patient's tolerance and avoiding withdrawal.  Will also use IV fluids liberally, Keep patient warm, and administer O2 2-3liters during labor to avoid crisis.  Epidural when labor stronger.  Filed Vitals:   12/05/14 0751 12/05/14 0755 12/05/14 1101  BP:  118/65 115/77  Pulse:  110 91  Temp:  98.2 F (36.8 C)   TempSrc:  Oral   Height: 5\' 1"  (1.549 m)    Weight: 174 lb (78.926 kg)     FHR reassuring Irregular mild contractions Dilation: Closed Effacement (%): 50 Cervical Position: Posterior Station: -2 Presentation: Vertex Exam by:: Katheran Awe, RNC  Continue to observe

## 2014-12-06 LAB — RPR: RPR: NONREACTIVE

## 2014-12-06 MED ORDER — HYDROMORPHONE HCL 1 MG/ML IJ SOLN
1.0000 mg | INTRAMUSCULAR | Status: DC | PRN
  Administered 2014-12-06 (×3): 2 mg via INTRAVENOUS
  Administered 2014-12-07: 1 mg via INTRAVENOUS
  Filled 2014-12-06 (×3): qty 2
  Filled 2014-12-06: qty 1

## 2014-12-06 MED ORDER — HYDROMORPHONE HCL 1 MG/ML IJ SOLN
2.0000 mg | INTRAMUSCULAR | Status: AC | PRN
  Administered 2014-12-06 (×2): 2 mg via INTRAVENOUS
  Filled 2014-12-06 (×2): qty 2

## 2014-12-06 MED ORDER — OXYTOCIN 40 UNITS IN LACTATED RINGERS INFUSION - SIMPLE MED
1.0000 m[IU]/min | INTRAVENOUS | Status: DC
  Administered 2014-12-06: 2 m[IU]/min via INTRAVENOUS
  Filled 2014-12-06: qty 1000

## 2014-12-06 MED ORDER — TERBUTALINE SULFATE 1 MG/ML IJ SOLN: 0.2500 mg | Freq: Once | INTRAMUSCULAR | Status: AC | PRN

## 2014-12-06 MED ORDER — HYDROMORPHONE HCL 1 MG/ML IJ SOLN
2.0000 mg | Freq: Once | INTRAMUSCULAR | Status: AC
  Administered 2014-12-06: 2 mg via INTRAVENOUS
  Filled 2014-12-06: qty 2

## 2014-12-06 NOTE — Progress Notes (Signed)
Patient ID: [name], female   DOB: 24-Sep-1990, 24 y.o.   MRN: [id number] [name] is a 24 y.o. G2P0010 at [redacted]w[redacted]d.  Subjective: Mild cramping.   Objective: BP 115/58 mmHg  Pulse 84  Temp(Src) 98.1 F (36.7 C) (Oral)  Resp 16  Ht 5\' 1"  (1.549 m)  Wt 174 lb (78.926 kg)  BMI 32.89 kg/m2  LMP 03/20/2014   FHT:  FHR: 145 bpm, variability: mod,  accelerations:  15x15,  decelerations:  Possible variables vs tracign MHR while pt rolled over sleeping. FHR WNL after adjustment. May need to apply Pulse oximeter.  UC:   Rare, mild   Dilation: 1 Effacement (%): 50 Cervical Position: Posterior Station: -2 Presentation: Vertex Exam by:: V.Nyana Haren,CNm Foley bulb placed w/out difficulty. Pt tolerated well.   Labs: Results for orders placed or performed during the hospital encounter of 12/05/14 (from the past 24 hour(s))  CBC     Status: Abnormal   Collection Time: 12/05/14  9:05 AM  Result Value Ref Range   WBC 13.7 (H) 4.0 - 10.5 K/uL   RBC 3.22 (L) 3.87 - 5.11 MIL/uL   Hemoglobin 9.7 (L) 12.0 - 15.0 g/dL   HCT 28.5 (L) 36.0 - 46.0 %   MCV 88.5 78.0 - 100.0 fL   MCH 30.1 26.0 - 34.0 pg   MCHC 34.0 30.0 - 36.0 g/dL   RDW 17.9 (H) 11.5 - 15.5 %   Platelets 292 150 - 400 K/uL  Type and screen     Status: None (Preliminary result)   Collection Time: 12/05/14  9:05 AM  Result Value Ref Range   ABO/RH(D) B POS    Antibody Screen NEG    Sample Expiration 12/08/2014    Unit Number [id number]    Blood Component Type RED CELLS,LR    Unit division 00    Status of Unit ALLOCATED    Transfusion Status OK TO TRANSFUSE    Crossmatch Result COMPATIBLE    Unit Number [id number]    Blood Component Type RED CELLS,LR    Unit division 00    Status of Unit ALLOCATED    Transfusion Status OK TO TRANSFUSE    Crossmatch Result COMPATIBLE     Assessment / Plan: [redacted]w[redacted]d week IUP Labor: IOL Fetal Wellbeing:  Category I-II? Pain Control:  Oxy IR Anticipated MOD:   SVD  [place], CNM 12/06/2014 4:57 AM

## 2014-12-06 NOTE — Progress Notes (Signed)
[name] is a 24 y.o. G2P0010 at 109w2d by ultrasound admitted for induction of labor due to sickle cell.  Subjective:   Objective: BP 94/56 mmHg  Pulse 103  Temp(Src) 98.6 F (37 C) (Oral)  Resp 18  Ht 5\' 1"  (1.549 m)  Wt 174 lb (78.926 kg)  BMI 32.89 kg/m2  LMP 03/20/2014 I/O last 3 completed shifts: In: 40 [I.V.:40] Out: -     FHT:  FHR: 140 bpm, variability: moderate,  accelerations:  Present,  decelerations:  Absent UC:   regular, every 3-4 minutes SVE:   Dilation: 1 Effacement (%): 50 Station: -2 Exam by:: V.Smith,CNm  Labs: Lab Results  Component Value Date   WBC 13.7* 12/05/2014   HGB 9.7* 12/05/2014   HCT 28.5* 12/05/2014   MCV 88.5 12/05/2014   PLT 292 12/05/2014    Assessment / Plan: Induction of labor due to [facility] medical conditions,  progressing well on pitocin  Labor: Progressing on Pitocin, will continue to increase then AROM Preeclampsia:  no signs or symptoms of toxicity and intake and ouput balanced Fetal Wellbeing:  Category I Pain Control:  dilaudid I/D:  n/a Anticipated MOD:  NSVD  [name] 12/06/2014, 5:41 PM

## 2014-12-06 NOTE — Progress Notes (Signed)
S: Resting in bed with mother at the bedside. Coping well.    O:  VS: Blood pressure 101/64, pulse 90, temperature 98.6 F (37 C), temperature source Oral, resp. rate 17, height 5\' 1"  (1.549 m), weight 78.926 kg (174 lb), last menstrual period 03/20/2014.        FHR : baseline 135 / variability moderate / accelerations present/ decelerations none        Toco: contractions every 3-5 minutes /        Cervix : 4/thick/ballotable        Membranes: intact        Pitocin currently at 28 m/u A:  Early labor     FHR category 1  P: Continue with pitocin Anticipate NSVD   [place] SNM 12/06/2014, 11:44 PM

## 2014-12-06 NOTE — Progress Notes (Signed)
[name] is a 24 y.o. G2P0010 at [redacted]w[redacted]d by ultrasound admitted for induction of labor due to sickle cell crisis.  Subjective:   Objective: BP 110/77 mmHg  Pulse 92  Temp(Src) 98.6 F (37 C) (Oral)  Resp 18  Ht 5\' 1"  (1.549 m)  Wt 174 lb (78.926 kg)  BMI 32.89 kg/m2  LMP 03/20/2014 I/O last 3 completed shifts: In: 40 [I.V.:40] Out: -     FHT:  FHR: 140 bpm, variability: moderate,  accelerations:  Present,  decelerations:  Absent UC:   occasional SVE:   Dilation: 1 Effacement (%): 50 Station: -2 Exam by:: V.Smith,CNm  Labs: Lab Results  Component Value Date   WBC 13.7* 12/05/2014   HGB 9.7* 12/05/2014   HCT 28.5* 12/05/2014   MCV 88.5 12/05/2014   PLT 292 12/05/2014    Assessment / Plan: Induction of labor due to sickle cell,  progressing well on pitocin  Labor: Progressing normally Preeclampsia:  no signs or symptoms of toxicity and intake and ouput balanced Fetal Wellbeing:  Category I Pain Control:  Labor support without medications I/D:  n/a Anticipated MOD:  NSVD  [place]ille Mcelrath DARLENE 12/06/2014, 11:43 AM

## 2014-12-06 NOTE — Progress Notes (Signed)
Patient ID: [name], female   DOB: 1990-11-05, 24 y.o.   MRN: [id number] [name] is a 24 y.o. G2P0010 at [redacted]w[redacted]d admitted for induction of labor due to recurrent sickle crises.  Subjective: Pt is sleeping.  Objective: BP 107/73 mmHg  Pulse 94  Temp(Src) 97.7 F (36.5 C) (Oral)  Resp 20  Ht 5\' 1"  (1.549 m)  Wt 78.926 kg (174 lb)  BMI 32.89 kg/m2  LMP 03/20/2014 I/O last 3 completed shifts: In: 40 [I.V.:40] Out: -     FHT:  FHR: 150 bpm, variability: minimalmoderate,  accelerations:  Present,  decelerations:  Present rare UC:   irregular, every 10 minutes SVE:   Dilation: 1 Effacement (%): 50 Station: -2 Exam by:: V.Smith,CNm  Labs: Lab Results  Component Value Date   WBC 13.7* 12/05/2014   HGB 9.7* 12/05/2014   HCT 28.5* 12/05/2014   MCV 88.5 12/05/2014   PLT 292 12/05/2014    Assessment / Plan: Induction of labor due to recurrent sickle crises,  progressing well on pitocin  Labor: Progressing normally Preeclampsia:   Fetal Wellbeing:  Category II Pain Control:  Labor support without medications Anticipated MOD:  NSVD  Waunita Schooner 12/06/2014, 2:33 PM

## 2014-12-07 ENCOUNTER — Encounter (HOSPITAL_COMMUNITY): Payer: Self-pay

## 2014-12-07 ENCOUNTER — Encounter (HOSPITAL_COMMUNITY): Payer: Medicare Other | Admitting: Anesthesiology

## 2014-12-07 ENCOUNTER — Encounter (HOSPITAL_COMMUNITY): Payer: Self-pay | Admitting: Anesthesiology

## 2014-12-07 DIAGNOSIS — D571 Sickle-cell disease without crisis: Secondary | ICD-10-CM

## 2014-12-07 DIAGNOSIS — Z3A37 37 weeks gestation of pregnancy: Secondary | ICD-10-CM

## 2014-12-07 LAB — BASIC METABOLIC PANEL
Anion gap: 6 (ref 5–15)
BUN: 6 mg/dL (ref 6–20)
CHLORIDE: 109 mmol/L (ref 101–111)
CO2: 21 mmol/L — AB (ref 22–32)
Calcium: 8.5 mg/dL — ABNORMAL LOW (ref 8.9–10.3)
Creatinine, Ser: 0.45 mg/dL (ref 0.44–1.00)
GFR calc Af Amer: >60 mL/min (ref >60)
GFR calc non Af Amer: >60 mL/min (ref >60)
Glucose, Bld: 95 mg/dL (ref 65–99)
Potassium: 3.5 mmol/L (ref 3.5–5.1)
SODIUM: 136 mmol/L (ref 135–145)

## 2014-12-07 MED ORDER — DIPHENHYDRAMINE HCL 50 MG/ML IJ SOLN
12.5000 mg | INTRAMUSCULAR | Status: DC | PRN
  Administered 2014-12-07 (×2): 12.5 mg via INTRAVENOUS
  Filled 2014-12-07 (×3): qty 1

## 2014-12-07 MED ORDER — SENNOSIDES-DOCUSATE SODIUM 8.6-50 MG PO TABS
2.0000 | ORAL_TABLET | ORAL | Status: DC
  Administered 2014-12-08 (×2): 2 via ORAL
  Filled 2014-12-07 (×3): qty 2

## 2014-12-07 MED ORDER — BENZOCAINE-MENTHOL 20-0.5 % EX AERO: 1.0000 "application " | INHALATION_SPRAY | CUTANEOUS | Status: DC | PRN

## 2014-12-07 MED ORDER — IBUPROFEN 600 MG PO TABS
600.0000 mg | ORAL_TABLET | Freq: Four times a day (QID) | ORAL | Status: DC
  Administered 2014-12-08 – 2014-12-09 (×5): 600 mg via ORAL
  Filled 2014-12-07 (×7): qty 1

## 2014-12-07 MED ORDER — ONDANSETRON HCL 4 MG/2ML IJ SOLN: 4.0000 mg | Freq: Four times a day (QID) | INTRAMUSCULAR | Status: DC | PRN

## 2014-12-07 MED ORDER — LIDOCAINE-EPINEPHRINE (PF) 2 %-1:200000 IJ SOLN
INTRAMUSCULAR | Status: DC | PRN
  Administered 2014-12-07: 4 mL

## 2014-12-07 MED ORDER — SODIUM CHLORIDE 0.9 % IJ SOLN: 3.0000 mL | INTRAMUSCULAR | Status: DC | PRN

## 2014-12-07 MED ORDER — DIPHENHYDRAMINE HCL 50 MG/ML IJ SOLN
12.5000 mg | Freq: Four times a day (QID) | INTRAMUSCULAR | Status: DC | PRN
  Administered 2014-12-07 – 2014-12-08 (×3): 12.5 mg via INTRAVENOUS
  Filled 2014-12-07 (×2): qty 1

## 2014-12-07 MED ORDER — DIBUCAINE 1 % RE OINT: 1.0000 "application " | TOPICAL_OINTMENT | RECTAL | Status: DC | PRN

## 2014-12-07 MED ORDER — DIPHENHYDRAMINE HCL 25 MG PO CAPS
25.0000 mg | ORAL_CAPSULE | Freq: Four times a day (QID) | ORAL | Status: DC | PRN
  Filled 2014-12-07 (×2): qty 1

## 2014-12-07 MED ORDER — SODIUM BICARBONATE 8.4 % IV SOLN: INTRAVENOUS | Status: DC | PRN

## 2014-12-07 MED ORDER — PRENATAL MULTIVITAMIN CH
1.0000 | ORAL_TABLET | Freq: Every day | ORAL | Status: DC
  Administered 2014-12-09: 1 via ORAL
  Filled 2014-12-07 (×2): qty 1

## 2014-12-07 MED ORDER — SODIUM CHLORIDE 0.9 % IV SOLN: 250.0000 mL | INTRAVENOUS | Status: DC | PRN

## 2014-12-07 MED ORDER — NALOXONE HCL 0.4 MG/ML IJ SOLN: 0.4000 mg | INTRAMUSCULAR | Status: DC | PRN

## 2014-12-07 MED ORDER — SODIUM CHLORIDE 0.9 % IJ SOLN: 9.0000 mL | INTRAMUSCULAR | Status: DC | PRN

## 2014-12-07 MED ORDER — LANOLIN HYDROUS EX OINT: TOPICAL_OINTMENT | CUTANEOUS | Status: DC | PRN

## 2014-12-07 MED ORDER — SODIUM CHLORIDE 0.9 % IJ SOLN: 3.0000 mL | Freq: Two times a day (BID) | INTRAMUSCULAR | Status: DC

## 2014-12-07 MED ORDER — EPHEDRINE 5 MG/ML INJ
10.0000 mg | INTRAVENOUS | Status: DC | PRN
  Filled 2014-12-07: qty 2

## 2014-12-07 MED ORDER — ZOLPIDEM TARTRATE 5 MG PO TABS: 5.0000 mg | ORAL_TABLET | Freq: Every evening | ORAL | Status: DC | PRN

## 2014-12-07 MED ORDER — DIPHENHYDRAMINE HCL 50 MG/ML IJ SOLN
12.5000 mg | Freq: Four times a day (QID) | INTRAMUSCULAR | Status: DC | PRN
Start: 2014-12-07 — End: 2014-12-07

## 2014-12-07 MED ORDER — ONDANSETRON HCL 4 MG PO TABS: 4.0000 mg | ORAL_TABLET | ORAL | Status: DC | PRN

## 2014-12-07 MED ORDER — FENTANYL 2.5 MCG/ML BUPIVACAINE 1/10 % EPIDURAL INFUSION (WH - ANES)
14.0000 mL/h | INTRAMUSCULAR | Status: DC | PRN
Start: 2014-12-07 — End: 2014-12-08
  Administered 2014-12-07 (×3): 14 mL/h via EPIDURAL
  Filled 2014-12-07 (×2): qty 125

## 2014-12-07 MED ORDER — DIPHENHYDRAMINE HCL 12.5 MG/5ML PO ELIX
12.5000 mg | ORAL_SOLUTION | Freq: Four times a day (QID) | ORAL | Status: DC | PRN
  Filled 2014-12-07: qty 5

## 2014-12-07 MED ORDER — WITCH HAZEL-GLYCERIN EX PADS: 1.0000 "application " | MEDICATED_PAD | CUTANEOUS | Status: DC | PRN

## 2014-12-07 MED ORDER — ACETAMINOPHEN 325 MG PO TABS: 650.0000 mg | ORAL_TABLET | ORAL | Status: DC | PRN

## 2014-12-07 MED ORDER — OXYCODONE-ACETAMINOPHEN 5-325 MG PO TABS: 1.0000 | ORAL_TABLET | ORAL | Status: DC | PRN

## 2014-12-07 MED ORDER — HYDROMORPHONE 0.3 MG/ML IV SOLN: INTRAVENOUS | Status: DC

## 2014-12-07 MED ORDER — PHENYLEPHRINE 40 MCG/ML (10ML) SYRINGE FOR IV PUSH (FOR BLOOD PRESSURE SUPPORT)
80.0000 ug | PREFILLED_SYRINGE | INTRAVENOUS | Status: AC | PRN
  Administered 2014-12-07 (×3): 80 ug via INTRAVENOUS
  Filled 2014-12-07 (×2): qty 20

## 2014-12-07 MED ORDER — ONDANSETRON HCL 4 MG/2ML IJ SOLN
4.0000 mg | INTRAMUSCULAR | Status: DC | PRN
  Administered 2014-12-07: 4 mg via INTRAVENOUS
  Filled 2014-12-07: qty 2

## 2014-12-07 MED ORDER — OXYCODONE-ACETAMINOPHEN 5-325 MG PO TABS: 2.0000 | ORAL_TABLET | ORAL | Status: DC | PRN

## 2014-12-07 MED ORDER — TETANUS-DIPHTH-ACELL PERTUSSIS 5-2.5-18.5 LF-MCG/0.5 IM SUSP: 0.5000 mL | Freq: Once | INTRAMUSCULAR | Status: DC

## 2014-12-07 MED ORDER — HYDROMORPHONE 0.3 MG/ML IV SOLN
INTRAVENOUS | Status: DC
  Administered 2014-12-07 – 2014-12-08 (×5): via INTRAVENOUS
  Administered 2014-12-08: 7.63 mL via INTRAVENOUS
  Administered 2014-12-08: 14:00:00 via INTRAVENOUS
  Filled 2014-12-07 (×7): qty 25

## 2014-12-07 MED ORDER — SIMETHICONE 80 MG PO CHEW: 80.0000 mg | CHEWABLE_TABLET | ORAL | Status: DC | PRN

## 2014-12-07 MED ORDER — BUPIVACAINE HCL (PF) 0.25 % IJ SOLN
INTRAMUSCULAR | Status: DC | PRN
  Administered 2014-12-07 (×2): 4 mL

## 2014-12-07 MED ORDER — MEASLES, MUMPS & RUBELLA VAC ~~LOC~~ INJ
0.5000 mL | INJECTION | Freq: Once | SUBCUTANEOUS | Status: DC
  Filled 2014-12-07: qty 0.5

## 2014-12-07 MED ORDER — OXYTOCIN 40 UNITS IN LACTATED RINGERS INFUSION - SIMPLE MED: 62.5000 mL/h | INTRAVENOUS | Status: DC | PRN

## 2014-12-07 NOTE — Progress Notes (Signed)
[name] is a 24 y.o. G2P0010 at [redacted]w[redacted]d by ultrasound admitted for induction of labor due to sickle cell.  Subjective:   Objective: BP 111/80 mmHg  Pulse 107  Temp(Src) 98.6 F (37 C) (Oral)  Resp 16  Ht 5\' 1"  (1.549 m)  Wt 174 lb (78.926 kg)  BMI 32.89 kg/m2  LMP 03/20/2014 I/O last 3 completed shifts: In: 20 [I.V.:20] Out: -     FHT:  FHR: 140-145 bpm, variability: moderate,  accelerations:  Present,  decelerations:  Absent UC:   regular, every 2-4 minutes SVE:   Dilation: 5 Effacement (%): 70 Station: +1 Exam by:: Darlene, CNM  Labs: Lab Results  Component Value Date   WBC 13.7* 12/05/2014   HGB 9.7* 12/05/2014   HCT 28.5* 12/05/2014   MCV 88.5 12/05/2014   PLT 292 12/05/2014    Assessment / Plan: Induction of labor due to [facility] medical conditions,  progressing well on pitocin  Labor: Progressing normally Preeclampsia:  no signs or symptoms of toxicity Fetal Wellbeing:  Category I Pain Control:  Epidural I/D:  n/a Anticipated MOD:  NSVD  Cecilio Ohlrich DARLENE 12/07/2014, 10:29 AM

## 2014-12-07 NOTE — Progress Notes (Signed)
   [name] is a 24 y.o. G2P0010 at [redacted]w[redacted]d  admitted for induction of labor 2/2 to sickle cell crisis.  Subjective:  Coping well with epidural.  Objective: Filed Vitals:   12/06/14 2200 12/06/14 2232 12/06/14 2300 12/06/14 2330  BP: 118/64 99/53 110/74 101/64  Pulse: 90 94 96 90  Temp: 98.6 F (37 C)     TempSrc:      Resp: 18 18 20 17   Height:      Weight:          FHT:  FHR: 135 bpm, variability: moderate,  accelerations:  Present,  decelerations:  Absent UC:   regular, every 2 minutes SVE:   Dilation: 4.5 Effacement (%): 60 Station: -1 Exam by:: Kaysen Deal, SNM  Pitocin @ 28 mu/min  Labs: Lab Results  Component Value Date   WBC 13.7* 12/05/2014   HGB 9.7* 12/05/2014   HCT 28.5* 12/05/2014   MCV 88.5 12/05/2014   PLT 292 12/05/2014    Assessment / Plan: Induction of labor due to sickle cell crisis,  Progressing slowly  on pitocin  Labor: Progressing slowly, will AROM and insert IUPC Fetal Wellbeing:  Category I Pain Control:  Epidural Anticipated MOD:  NSVD  Starr [place], SNM 12/07/2014, 2:09 AM

## 2014-12-07 NOTE — Anesthesia Preprocedure Evaluation (Addendum)
Anesthesia Evaluation  Patient identified by MRN, date of birth, ID band Patient awake    Reviewed: Allergy & Precautions, Patient's Chart, lab work & pertinent test results  History of Anesthesia Complications Negative for: history of anesthetic complications  Airway Mallampati: III  TM Distance: >3 FB Neck ROM: Full    Dental   Pulmonary neg shortness of breath, neg sleep apnea, neg recent URI, former smoker,    Pulmonary exam normal       Cardiovascular negative cardio ROS Normal cardiovascular exam    Neuro/Psych  Headaches, PSYCHIATRIC DISORDERS Depression    GI/Hepatic Neg liver ROS, GERD-  Controlled,  Endo/Other  negative endocrine ROS  Renal/GU negative Renal ROS     Musculoskeletal   Abdominal   Peds  Hematology  (+) Sickle cell anemia and anemia ,   Anesthesia Other Findings   Reproductive/Obstetrics (+) Pregnancy                            Anesthesia Physical Anesthesia Plan  ASA: III  Anesthesia Plan: Epidural   Post-op Pain Management:    Induction:   Airway Management Planned:   Additional Equipment:   Intra-op Plan:   Post-operative Plan:   Informed Consent: I have reviewed the patients History and Physical, chart, labs and discussed the procedure including the risks, benefits and alternatives for the proposed anesthesia with the patient or authorized representative who has indicated his/her understanding and acceptance.     Plan Discussed with: Anesthesiologist  Anesthesia Plan Comments:         Anesthesia Quick Evaluation

## 2014-12-07 NOTE — Anesthesia Procedure Notes (Signed)
Epidural Patient location during procedure: OB  Staffing Anesthesiologist: Ahyana Skillin, CHRIS Performed by: anesthesiologist   Preanesthetic Checklist Completed: patient identified, surgical consent, pre-op evaluation, timeout performed, IV checked, risks and benefits discussed and monitors and equipment checked  Epidural Patient position: sitting Prep: DuraPrep Patient monitoring: heart rate, cardiac monitor, continuous pulse ox and blood pressure Approach: midline Location: L3-L4 Injection technique: LOR saline  Needle:  Needle type: Tuohy  Needle gauge: 17 G Needle length: 9 cm Needle insertion depth: 7 cm Catheter type: closed end flexible Catheter size: 19 Gauge Catheter at skin depth: 12 cm Test dose: negative and 2% lidocaine with Epi 1:200 K  Assessment Events: blood not aspirated, injection not painful, no injection resistance, negative IV test and no paresthesia  Additional Notes Reason for block:procedure for pain

## 2014-12-07 NOTE — Progress Notes (Signed)
Provider at bedside to perform SVE and assess FHR--orders to increase pitocin

## 2014-12-07 NOTE — Progress Notes (Signed)
Patient ID: [name], female   DOB: Jan 24, 1991, 24 y.o.   MRN: [id number] [name] is a 24 y.o. G2P0010 at [redacted]w[redacted]d.  Subjective: Comfortable w/ epidural.   Objective: BP 111/80 mmHg  Pulse 107  Temp(Src) 98.6 F (37 C) (Oral)  Resp 16  Ht 5\' 1"  (1.549 m)  Wt 174 lb (78.926 kg)  BMI 32.89 kg/m2  LMP 03/20/2014   FHT:  FHR: 140 bpm, variability: mod,  accelerations:  15x15,  decelerations:  Variables UC:   Q 2-3 minutes, mod. MVU's 125-160 Dilation: 5.5 Effacement (%): 70 Cervical Position: Posterior Station: 11 Presentation: Vertex Exam by:: Manya Silvas, CNM  Labs: No results found for this or any previous visit (from the past 24 hour(s)).  Assessment / Plan: [redacted]w[redacted]d week IUP Labor: Protracted latent phase Fetal Wellbeing:  Category I-II Pain Control:  Epidural Anticipated MOD:  Uncertain.  Dr. Roselie Awkward updated.   [place], CNM 12/07/2014 0700

## 2014-12-07 NOTE — Progress Notes (Signed)
Provider aware of late delcelerations--orders to continue pitocin and administer O2 to pt

## 2014-12-08 LAB — CBC
HEMATOCRIT: 21.1 % — AB (ref 36.0–46.0)
Hemoglobin: 7.1 g/dL — ABNORMAL LOW (ref 12.0–15.0)
MCH: 30 pg (ref 26.0–34.0)
MCHC: 33.6 g/dL (ref 30.0–36.0)
MCV: 89 fL (ref 78.0–100.0)
PLATELETS: 274 10*3/uL (ref 150–400)
RBC: 2.37 MIL/uL — ABNORMAL LOW (ref 3.87–5.11)
RDW: 17.4 % — AB (ref 11.5–15.5)
WBC: 22.1 10*3/uL — ABNORMAL HIGH (ref 4.0–10.5)

## 2014-12-08 MED ORDER — DIPHENHYDRAMINE HCL 50 MG/ML IJ SOLN
12.5000 mg | Freq: Four times a day (QID) | INTRAMUSCULAR | Status: DC | PRN
  Administered 2014-12-08 – 2014-12-09 (×2): 12.5 mg via INTRAVENOUS
  Filled 2014-12-08 (×2): qty 1

## 2014-12-08 MED ORDER — HYDROMORPHONE 0.3 MG/ML IV SOLN
INTRAVENOUS | Status: DC
  Administered 2014-12-08: 7.65 mg via INTRAVENOUS
  Administered 2014-12-09: 1.93 mg via INTRAVENOUS
  Administered 2014-12-09: 4.93 mg via INTRAVENOUS
  Administered 2014-12-09: 7.64 mg via INTRAVENOUS
  Administered 2014-12-09: 7.65 mg via INTRAVENOUS
  Administered 2014-12-09: 3.93 mg via INTRAVENOUS
  Administered 2014-12-09: 0.3 mg via INTRAVENOUS
  Administered 2014-12-09: 6.33 mg via INTRAVENOUS
  Administered 2014-12-10: 2.1 mg via INTRAVENOUS
  Administered 2014-12-10: 1.2 mg via INTRAVENOUS
  Administered 2014-12-10: 1.33 mL via INTRAVENOUS
  Filled 2014-12-08 (×5): qty 25

## 2014-12-08 MED ORDER — TETANUS-DIPHTH-ACELL PERTUSSIS 5-2.5-18.5 LF-MCG/0.5 IM SUSP
0.5000 mL | Freq: Once | INTRAMUSCULAR | Status: AC
  Administered 2014-12-10: 0.5 mL via INTRAMUSCULAR

## 2014-12-08 MED ORDER — LACTATED RINGERS IV SOLN
INTRAVENOUS | Status: DC
  Administered 2014-12-08 – 2014-12-10 (×7): via INTRAVENOUS

## 2014-12-08 MED ORDER — DIPHENHYDRAMINE HCL 12.5 MG/5ML PO ELIX
12.5000 mg | ORAL_SOLUTION | Freq: Four times a day (QID) | ORAL | Status: DC | PRN
  Filled 2014-12-08: qty 5

## 2014-12-08 MED ORDER — MEASLES, MUMPS & RUBELLA VAC ~~LOC~~ INJ
0.5000 mL | INJECTION | Freq: Once | SUBCUTANEOUS | Status: AC
  Administered 2014-12-10: 0.5 mL via SUBCUTANEOUS
  Filled 2014-12-08: qty 0.5

## 2014-12-08 NOTE — Lactation Note (Signed)
This note was copied from the chart of [place]. Lactation Consultation Note Baby sent to NICU d/t Scores high d/t withdrawing. RN set up DEBP and taught how to use but mom didn't use during the night any. Supply and demand reviewed, mom tired and not feeling well. Patient Name: [name] [id number]'P Date: 12/08/2014 Reason for consult: Follow-up assessment   Maternal Data    Feeding Feeding Type: Formula Nipple Type: Slow - flow Length of feed: 30 min  LATCH Score/Interventions                      Lactation Tools Discussed/Used Pump Review: Setup, frequency, and cleaning;Milk Storage Initiated by:: RN/Wendy Date initiated:: 12/08/14   Consult Status Consult Status: Follow-up Date: 12/08/14 Follow-up type: In-patient    Sueann Brownley, Elta [place] 12/08/2014, 6:07 AM

## 2014-12-08 NOTE — Care Management Important Message (Signed)
Important Message  Patient Details  Name: [name] MRN: [id number] Date of Birth: 1990/07/12   Medicare Important Message Given:  Yes-second notification given    Shelda Altes 12/08/2014, 9:12 AMImportant Message  Patient Details  Name: [name] MRN: [id number] Date of Birth: 13-Aug-1990   Medicare Important Message Given:  Yes-second notification given    Shelda Altes 12/08/2014, 9:11 AM

## 2014-12-08 NOTE — Anesthesia Postprocedure Evaluation (Signed)
  Anesthesia Post-op Note  [name]  Procedure(s) Performed: * No procedures listed *  Patient Location: Mother/Baby  Anesthesia Type:Epidural  Level of Consciousness: awake, alert , oriented and patient cooperative  Airway and Oxygen Therapy: Patient Spontanous Breathing on PCA EtCO2 39 SaO2 96% room air  Post-op Pain: mild  Post-op Assessment: Patient's Cardiovascular Status Stable, Respiratory Function Stable, No headache, No backache and Patient able to bend at knees states able to stand without problem              Post-op Vital Signs: stable  Last Vitals:  Filed Vitals:   12/08/14 0601  BP:   Pulse:   Temp:   Resp: 19    Complications: No apparent anesthesia complications

## 2014-12-08 NOTE — Progress Notes (Signed)
Post Partum Day 1 Subjective: right arm and shoulder pain c/w crisis  Objective: Blood pressure 105/81, pulse 107, temperature 98 F (36.7 C), temperature source Oral, resp. rate 18, height 5\' 1"  (1.549 m), weight 78.926 kg (174 lb), last menstrual period 03/20/2014, SpO2 99 %, unknown if currently breastfeeding.  Physical Exam:  General: alert, cooperative and mild distress Lochia: appropriate   No results for input(s): HGB, HCT in the last 72 hours.  Assessment/Plan: PP day 1, c/o crisis pain on PCA 6/10. CBC, consult sickle cell medicine   LOS: 3 days   Sumayya Muha 12/08/2014, 9:08 PM

## 2014-12-08 NOTE — Progress Notes (Signed)
Post Partum Day 1 Subjective: no complaints, up ad lib, voiding and tolerating PO  Objective: Blood pressure 111/69, pulse 112, temperature 98.4 F (36.9 C), temperature source Oral, resp. rate 19, height 5\' 1"  (1.549 m), weight 174 lb (78.926 kg), last menstrual period 03/20/2014, SpO2 96 %, unknown if currently breastfeeding.  Physical Exam:  General: alert, cooperative, appears stated age and no distress Lochia: appropriate Uterine Fundus: firm Incision: healing well DVT Evaluation: No evidence of DVT seen on physical exam. Negative Homan's sign. No cords or calf tenderness.   Recent Labs  12/05/14 0905  HGB 9.7*  HCT 28.5*    Assessment/Plan: Plan for discharge tomorrow   LOS: 3 days   LAWSON, [place] 12/08/2014, 7:12 AM

## 2014-12-08 NOTE — Progress Notes (Signed)
Dr. Roselie Awkward called re pt. pain level and supposed sickle cell crisis and use of Hi dose dilaudid syringe every 4-5 hrs. Plus  being on methadone. He came over to talk with patient and decided to reorder hydromorphone PCA after at first discontinuing order. He also ordered cbc and consult with Dr. Zigmund Daniel from [facility] sickle cell clinic for in the morning. Tomorrow would be pt discharge day for vaginal delivery. Baby is in nicu for withdrawal.

## 2014-12-08 NOTE — Plan of Care (Signed)
Problem: Phase I Progression Outcomes Goal: Pain controlled with appropriate interventions Outcome: Progressing Pt on methadone and hi dose PCA hydromorphone for sickle cell crisis, consult in for Dr. Zigmund Daniel sickle cell md per OB md.

## 2014-12-08 NOTE — Plan of Care (Signed)
Problem: Phase I Progression Outcomes Goal: Pain controlled with appropriate interventions Outcome: Not Met (add Reason) On PCA Dilaudid  Problem: Phase II Progression Outcomes Goal: Pain controlled on oral analgesia Outcome: Not Met (add Reason) On PCA Dilaudid  Problem: Discharge Progression Outcomes Goal: Barriers To Progression Addressed/Resolved Outcome: Not Progressing SCC- rt arm- on PCA Dilaudid

## 2014-12-08 NOTE — Progress Notes (Signed)
Mom enc to pump q 3-4 hr- to stimulate breast for making breast milk. Verbalized understanding. Baby in NICU. Not wanting to pump @ this time.

## 2014-12-08 NOTE — Lactation Note (Signed)
This note was copied from the chart of [place]. Lactation Consultation Note  Follow up lactation visit.  Mom states she pumped 2 bottles this AM.  Instructed to try to pump every 3 hours x 15 minutes.  [facility] [place] referral faxed to [facility] office for breast pump after discharge.  Patient Name: [name] MEQAS'T Date: 12/08/2014     Maternal Data    Feeding Feeding Type: Formula Length of feed: 60 min  LATCH Score/Interventions                      Lactation Tools Discussed/Used     Consult Status      Ave Filter 12/08/2014, 3:18 PM

## 2014-12-09 DIAGNOSIS — D72829 Elevated white blood cell count, unspecified: Secondary | ICD-10-CM

## 2014-12-09 DIAGNOSIS — D5700 Hb-SS disease with crisis, unspecified: Secondary | ICD-10-CM

## 2014-12-09 LAB — HEMOGLOBIN AND HEMATOCRIT, BLOOD
HCT: 22.2 % — ABNORMAL LOW (ref 36.0–46.0)
Hemoglobin: 7.5 g/dL — ABNORMAL LOW (ref 12.0–15.0)

## 2014-12-09 LAB — TYPE AND SCREEN
ABO/RH(D): B POS
Antibody Screen: NEGATIVE
UNIT DIVISION: 0
Unit division: 0
Unit division: 0

## 2014-12-09 MED ORDER — KETOROLAC TROMETHAMINE 30 MG/ML IJ SOLN
30.0000 mg | Freq: Once | INTRAMUSCULAR | Status: AC
  Administered 2014-12-09: 30 mg via INTRAVENOUS
  Filled 2014-12-09: qty 1

## 2014-12-09 MED ORDER — KETOROLAC TROMETHAMINE 30 MG/ML IJ SOLN
30.0000 mg | Freq: Four times a day (QID) | INTRAMUSCULAR | Status: DC
  Administered 2014-12-09 – 2014-12-10 (×3): 30 mg via INTRAVENOUS
  Filled 2014-12-09 (×3): qty 1

## 2014-12-09 MED ORDER — SODIUM CHLORIDE 0.9 % IV SOLN
25.0000 mg | INTRAVENOUS | Status: DC | PRN
  Filled 2014-12-09: qty 0.5

## 2014-12-09 MED ORDER — DIPHENHYDRAMINE HCL 25 MG PO CAPS
25.0000 mg | ORAL_CAPSULE | Freq: Four times a day (QID) | ORAL | Status: DC | PRN
  Administered 2014-12-10: 25 mg via ORAL
  Filled 2014-12-09: qty 1

## 2014-12-09 NOTE — Clinical Social Work Maternal (Signed)
CLINICAL SOCIAL WORK MATERNAL/CHILD NOTE  Patient Details  Name: [name] MRN: [id number] Date of Birth: 11/27/1990  Date:  01/21/15  Clinical Social Worker Initiating Note:  Lucita Ferrara, LCSW Date/ Time Initiated:  12/09/14/1430     Child's Name:  [name]   Legal Guardian:  [name] (mother)  Need for Interpreter:  None   Date of Referral:  2014/12/30     Reason for Referral:  NICU admission, history of depression   Referral Source:  NICU   Address:  [place], [place] [address]  Phone number:  [phone]   Household Members:  Godsister: age 62   Natural Supports (not living in the home):  Immediate Family, Extended Family   Professional Supports: Case Manager/Social Worker at [employer]   Employment: Disabled   Type of Work:   N/A  Education:    N/A  Pensions consultant:  Information systems manager , [employer]   Other Resources:    [employer], Section 8 housing, disability  Cultural/Religious Considerations Which May Impact Care:  None reported  Strengths:  Ability to meet basic needs , Pediatrician chosen , Home prepared for child    Risk Factors/Current Problems:   1)Mental Health Concerns: History of depression and anxiety since childhood. MOB denied symptoms during the pregnancy despite numerous hospitalizations and sickle cell pain.  2) Sickle cell complications: MOB with numerous hospitalizations secondary to sickle cell crisis during pregnancy.  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   Cognitive State:  Able to Concentrate , Alert , Goal Oriented , Linear Thinking    Mood/Affect:  Bright , Calm , Comfortable , Happy , Interested    CSW Assessment:  CSW met with MOB due to infant being admitted to NICU secondary to symptoms of withdrawal as MOB is prescribed numerous opiates to assist with sickle cell pain.  CSW introduced self and role of CSW in NICU admission. MOB reported that she had recently returned  from the infant, and she expressed happiness related to his progress.  MOB shared that she is happy and feels that her life is complete now that he is born. She discussed that instead of focusing on the noises and machines in the NICU, she is focusing on the infant and how happy she is to interact with him.  MOB shared that she is aware of signs of withdrawal to watch for, and feels that she is coping well when she sees the symptoms.  MOB discussed how she is attempting to cope with the NICU admission by focusing on it being a short term and temporary situation and by telling herself that the infant is being well taken care of.  MOB reported having a strong support system that will assist her as she transitions to postpartum. She shared that she has a case worker through her sickle cell clinic, who has also helped her to secure transportation and additional support services postpartum. MOB shared that she has also prepared the home for the infant.  She stated that she knew she needed to prepare for the infant by increasing her support in the event that she is not feeling well secondary to her sickle cell.  MOB discussed how she had plenty of time to think and prepare due to her numerous hospital admission this pregnancy.    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  She stated that she participated in therapy from ages [age], and reported belief that she has learned to  cope with her sickle cell and symptoms.  MOB discussed that she at times allows herself to feel sad and overwhelmed, but then decides that she does not want to feel that way and chooses to change her approach to her thoughts in order to feel better.  MOB reported that she has already researched postpartum depression and anxiety, and feels confident that she will be able to notify her PCP if she notes any symptoms. MOB denied any barriers to accessing care since she believes that she is vocal about what  she needs.     MOB denied additional questions, concerns, or needs at this time.  She expressed belief that she is coping well, but will notify CSW if needs arise. She expressed appreciation for the support.  CSW Plan/Description:   1)Patient/Family Education : Perinatal mood and anxiety disorders 2)No Further Intervention Required/No Barriers to Discharge    Sharyl Nimrod 2014/12/06, 3:37 PM

## 2014-12-09 NOTE — Consult Note (Signed)
[facility] Medical Consultation  [name] ZLD:[id number] DOB: [birth year] DOA: 12/05/2014 PCP: Angelica Chessman, MD   Requesting physician: Dr. Roselie Awkward Date of consultation: 12/09/2014 Reason for consultation: Hb SS with pain  Impression/Recommendations Active Problems:  1. Hb SS with crisis: Recommend continuing PCA at current dose and Methadone. If no contraindications, would discontinue ibuprofen and start Toradol 30 mg IV q 6 hours.  2. Leukocytosis: Her labs from today are pending. I suspect that this elevation is secondary to the inflammatory process and may also reflect the stress of the birthing process. Will also check differential to evaluate for left shift.    Chief Complaint:  Pain in left UE x 1 day.  HPI:  Pt with Hb SS well known to me is post-partum Day #2 and began having pain in the right elbow, arm and forearm last night. She was started on PCA at usual dose several days ago and states that is was largely effective until last night. She is unable to move the RUE without pain. And describes the pain as throbbing and consistent with pain of crisis. The pain is throbbing in nature and non-radiating. She reports the current PCA dose is leading to decrease in pain with each bolus but the pain relief is not sustained. She has no fevers and I have reviewed her lab data from th last few days. Today's labs are pending.  I will followup again tomorrow. Please contact me if I can be of assistance in the meanwhile. Thank you for this consultation.   Review of Systems:  Review of Systems  Constitutional: Negative.   HENT: Negative.   Eyes: Negative.   Respiratory: Negative.   Cardiovascular: Negative.   Gastrointestinal: Negative.   Genitourinary: Negative.   Musculoskeletal: Positive for myalgias (Right arm and forearm). Joint pain: Right elbow.  Neurological: Negative.   Psychiatric/Behavioral: Negative.   All other systems reviewed and are  negative.    Past Medical History  Diagnosis Date  . Miscarriage 03/22/2011    Pt reports 2 miscarriages.  . Depression 01/06/2011  . GERD (gastroesophageal reflux disease) 02/17/2011  . Trichotillomania     h/o  . Blood transfusion     "lots"  . Sickle cell anemia with crisis   . Exertional dyspnea     "sometimes"  . Migraines 11/08/11    "@ least twice/month"  . Chronic back pain     "very severe; have knot in my back; from tight muscle; take RX and exercise for it"  . Mood swings 11/08/11    "I go back and forth; real bad"  . Sickle cell anemia    Past Surgical History  Procedure Laterality Date  . Cholecystectomy  05/2010  . Dilation and curettage of uterus  02/20/11    S/P miscarriage   Social History:  reports that she quit smoking about 20 months ago. Her smoking use included Cigarettes. She has a .25 pack-year smoking history. She has never used smokeless tobacco. She reports that she does not drink alcohol or use illicit drugs.  Allergies  Allergen Reactions  . Carrot [Daucus Carota] Hives, Swelling and Rash    Dietary:  Please do not send any form of carrot to patient  . Carrot Oil Hives and Swelling  . Latex Rash   Family History  Problem Relation Age of Onset  . Diabetes Maternal Grandmother   . Diabetes Paternal Grandmother   . Hypertension Paternal Grandmother   . Diabetes Maternal Grandfather   . Sickle cell  trait Mother   . Sickle cell trait Father     Prior to Admission medications   Medication Sig Start Date End Date Taking? Authorizing Provider  folic acid (FOLVITE) 1 MG tablet Take 2 tablets (2 mg total) by mouth daily. Patient taking differently: Take 1 mg by mouth daily.  04/24/14  Yes Dorena Dew, FNP  methadone (DOLOPHINE) 5 MG tablet Take 1 tablet (5 mg total) by mouth at bedtime. 12/01/14  Yes Osborne Oman, MD  oxyCODONE (ROXICODONE) 15 MG immediate release tablet Take 1 tablet (15 mg total) by mouth every 4 (four) hours as needed for  pain. 12/03/14  Yes Micheline Chapman, NP  Prenatal Vit-Fe Fumarate-FA (PRENATAL MULTIVITAMIN) TABS tablet Take 1 tablet by mouth daily at 12 noon.   Yes Historical Provider, MD   Physical Exam: General: Alert, awake, oriented x3, in mild distress.  Vital Signs:BP111/60, HR 103, T 98 F (36.7 C), temperature source Oral, RR 16, height 5\' 1"  (1.549 m), weight 174 lb (78.926 kg), last menstrual period 03/20/2014, SpO2 100 %, unknown if currently breastfeeding. HEENT: Lucas/AT PEERL, EOMI, anicteric Neck: Trachea midline, no masses, no thyromegal,y no JVD, no carotid bruit OROPHARYNX: Moist, No exudate/ erythema/lesions.  Heart: Regular rate and rhythm, without murmurs, rubs, gallops or S3. PMI non-displaced. Exam reveals no decreased pulses. Pulmonary/Chest: Normal effort. Breath sounds normal. No. Apnea. Clear to auscultation,no stridor,  no wheezing and no rhonchi noted. No respiratory distress and no tenderness noted. Abdomen: Soft, nontender, nondistended, normal bowel sounds, no masses no hepatosplenomegaly noted. No fluid wave and no ascites. There is no guarding or rebound. Neuro: Alert and oriented to person, place and time. Normal motor skills, Displays no atrophy or tremors and exhibits normal muscle tone.  No focal neurological deficits noted cranial nerves II through XII grossly intact. No sensory deficit noted.Strength at baseline in bilateral upper and lower extremities. Gait normal. Musculoskeletal: No warm swelling or erythema around joints, no spinal tenderness noted. Psychiatric: Patient alert and oriented x3, good insight and cognition, good recent to remote recall. Lymph node survey: No cervical axillary or inguinal lymphadenopathy noted. Skin: Skin is warm and dry. No bruising, no ecchymosis and no rash noted. Pt is not diaphoretic. No erythema. No pallor Psychiatric: Mood, memory, affect and judgement normal   Labs on Admission:  Basic Metabolic Panel:  Recent Labs Lab  12/07/14 1700  NA 136  K 3.5  CL 109  CO2 21*  GLUCOSE 95  BUN 6  CREATININE 0.45  CALCIUM 8.5*   Liver Function Tests: No results for input(s): AST, ALT, ALKPHOS, BILITOT, PROT, ALBUMIN in the last 168 hours. No results for input(s): LIPASE, AMYLASE in the last 168 hours. No results for input(s): AMMONIA in the last 168 hours. CBC:  Recent Labs Lab 12/05/14 0905 12/08/14 2224 12/09/14 0940  WBC 13.7* 22.1*  --   HGB 9.7* 7.1* 7.5*  HCT 28.5* 21.1* 22.2*  MCV 88.5 89.0  --   PLT 292 274  --     Time spent: 30 minutes  MATTHEWS,MICHELLE A. Sickle Cell Service Pager [phone]  If 7PM-7AM, please contact night-coverage [email or web address] Password [email or web address] 12/09/2014, 1:03 PM

## 2014-12-09 NOTE — Progress Notes (Signed)
Offerred to give pt tdap or rubella vaccine she declined to have at this time wants later. Pt has been asleep since 1500 fter visiting NICU Rates pain level at 7 when awakened for VS.

## 2014-12-09 NOTE — Plan of Care (Signed)
Problem: Discharge Progression Outcomes Goal: Complications resolved/controlled Outcome: Progressing Dr. Zigmund Daniel from [facility] long asked Dr. Elly Modena to keep pt. Here for 1-2 more days to manage her pain from what pt perceives as sickle cell crisis. CBC's  were done x2 today and both Dr. Elly Modena and Dr. Zigmund Daniel saw them. Currently pt is still receiving Hi dose PCA Dilaudid through her PICC line and toradol IVP for pain. Baby is in NICU being treated for withdrawal.

## 2014-12-09 NOTE — Progress Notes (Signed)
Postpartum Progress Note PPD#2 Today: No acute events overnight.  Pain is moderately controlled with methadone, oxycodone, and PCA pump. Pain was a 10/10 in right elbow following delivery, but is now about 6/10 sharp pain. States that pain makes it difficult to lift her arm and does not feel comfortable being discharge to home. Pt denies problems with ambulating, voiding or po intake.  She denies nausea or vomiting.  She denies dizziness or swelling of legs. She has had flatus. She has not had bowel movement.  Lochia Small.  Plan for birth control is IUD.  Method of Feeding: Breast (bottle with breast milk)  Physical Exam:  BP 111/60 mmHg  Pulse 103  Temp(Src) 98 F (36.7 C) (Oral)  Resp 16  Ht 5\' 1"  (1.549 m)  Wt 78.926 kg (174 lb)  BMI 32.89 kg/m2  SpO2 98%  LMP 03/20/2014  Breastfeeding? Unknown  General: alert, cooperative and no distress Lochia: appropriate Uterine Fundus: firm Incision: N/A DVT Evaluation: No evidence of DVT seen on physical exam. Negative Homan's sign. No significant calf/ankle edema. MSK: right elbow mildly swollen without erythema, lesions, or drainage. Mildly tender to palpation.  Patient pressed button for PCA pump for pain relief while in the room.   H/H: Lab Results  Component Value Date/Time   HGB 7.1* 12/08/2014 10:24 PM   HGB 8.7* 05/11/2012 07:21 AM   HCT 21.1* 12/08/2014 10:24 PM   HCT 26.9* 05/11/2012 07:21 AM   Activity: pelvic rest including no sexual intercourse or tampons for 6 weeks Diet: routine  Medications: PNV Breast feeding:  Yes Boy/breast/IUD GBS negative   Assessment and plan:  Patient will keep recommendations from Heme/Onc [facility]). Provided with toradol for pain control. Monitor for worsening pulmonary fx and will defer to Dr. Zigmund Daniel' plan of care with plan to d/c tomorrow. She is stable from a delivery standpoint.    Caren Macadam, MD 12/09/2014 8:34 PM

## 2014-12-09 NOTE — Progress Notes (Signed)
Per Dr Elly Modena, Dr.Matthews believes the [name] will be discharged tomorrow or the following day, so mother care should remain here at [facility] to promote bonding with her NICU baby who she has been visiting.

## 2014-12-09 NOTE — Discharge Planning (Signed)
[name] notified that patient has had her social work consult.He will consult [name] as far as the discharge plan of care for [name]. She has visited the baby in NICU for 3 hours today and is very pleasant. She complains of pain in her rt upper arm that is constant and at the level of a "7". She was medicated once this AM for itching but has had no complaints of itching this afternoon. She is requesting that her PICC line dressing be changed. House coverage notified.We will wait on the dressing change depending on the discharge/transfer process pending.

## 2014-12-09 NOTE — Lactation Note (Addendum)
This note was copied from the chart of [place]. Lactation Consultation Note  Patient Name: [name] UDTHY'H Date: 12/09/2014 Reason for consult: Follow-up assessment;NICU baby NICU baby 67 hours old. Mom starting to pump when this LC entered room. Mom having trouble with left nipple. Fitted mom with a #30 flange and milk immediately began flowing. Right breast flowing well with #27 flange. Gave mom double roll of wash cloths to place under each breast for support while pumping and mom reports increased comfort. Enc mom to pump at least 8 times a day for 15-20 minutes. Enc mom to ask for assistance as needed. Enc mo mom to call [facility] office for an appointment for a DEBP. Mom also given [facility] loaner paperwork in case it is needed.  Maternal Data Has patient been taught Hand Expression?: Yes  Feeding Feeding Type: Formula Nipple Type: Slow - flow Length of feed: 30 min  LATCH Score/Interventions                      Lactation Tools Discussed/Used     Consult Status Consult Status: Follow-up Date: 12/10/14 Follow-up type: In-patient    Inocente Salles 12/09/2014, 11:14 AM

## 2014-12-09 NOTE — Progress Notes (Signed)
Dr. Elly Modena contacted re transfer of pt for further pain management of sickle cell crisis. Pt still receiving hi dose PCA hydromorphone and toradol IVP for pain. Dr. Elly Modena said pt will stay here 1-2 more days for pain management as Dr. Zigmund Daniel did not want to transfer pt to [place] for only 1-2 days. As baby in NICU. I questioned discontinuing PCA and starting pt on PO pain meds to Dr. Elly Modena but she said Dr. Zigmund Daniel is in charge of pain management for this pt. For now pain management will continue to be PCA hi dose Dilaudid.

## 2014-12-09 NOTE — Discharge Summary (Signed)
Hospital Course: [name] is a 24 y.o. female G45P1011 PPD#2 s/p SVD at [redacted]w[redacted]d gestation.  Patient was admitted on 12/05/2014 for IOL 2/2 increasing Sickle Cell pain crises. Patient had been admitted to the hospital multiple times during pregnancy for pain crises. Followed by Dr. Zigmund Daniel for Sickle Cell Disease. At home medications for pain management include 15 mg oxycodone and 5 mg methadone at bedtime. Labor was augmented by cytotec and pitocin. Labor progressed normally and epidural was given for pain relief. Pain medications were reduced to methadone qhs and Oxy IR 10 q4h. Consulted with Dr. Zigmund Daniel about how to reduce likelihood of pain crises and the following was implemented: O2 at 2-3 liters via [place], IV fluids, kept warm. GBS negative. Patient delivered via SVD on 12/07/2014 at 1328. There were no complications during the delivery. Baby boy was moved to NICU for management of withdrawal symptoms. Following delivery, patient began experiencing 10/10 sharp pains in the right elbow after using arms to hold legs back during delivery. Pain continued and patient described it as feeling like a sickle cell pain crisis. Pain medication was not managing the pain well so a Dilaudid PCA pump was initiated. Besides pain management, patient is doing well, tolerating po intake, denies n/V, voiding well, +flatus, and denies dizziness. Pumping breast milk for baby boy who remains in the NICU with an improving condition. Patient states that pain in right elbow makes it difficult to raise her arm and she did not feel comfortable with discharge on PPD#2. It was felt she was having a mild sickle cell crisis. Consulted with Dr. Zigmund Daniel who recommended continued admission until 7/27. She was discharged on her home pain regimen.   Obstetric Discharge Summary Reason for Admission: induction of labor at [redacted]w[redacted]d gestation 2/2 increasing Sickle Cell pain crises  Prenatal Procedures: ultrasound Intrapartum Procedures:  spontaneous vaginal delivery Postpartum Procedures: none Complications-Operative and Postpartum: none  Delivery Note At 1:28 PM a viable female was delivered via Vaginal, Spontaneous Delivery (Presentation: Left Occiput Anterior). APGAR: 9,9 ; weight .  Placenta status: Intact, Spontaneous. Cord: 3 vessels with the following complications: None. Cord pH: n/a  Anesthesia: Epidural  Episiotomy: None Lacerations: None Suture Repair: n/a Est. Blood Loss (mL): 124  Mom to postpartum. Baby to Couplet care / Skin to Skin.  Hospital Course:  Active Problems:   Labor abnormal   [name] is a 24 y.o. female G31P1011 PPD#2 s/p SVD at [redacted]w[redacted]d gestation.  Patient was admitted for IOL 2/2 increasing Sickle Cell pain crises.  She has postpartum course that was complicated by pain in right elbow after delivery that patient states is a Sickle Cell pain crisis. The pt does not feel ready to go home, and will be transferred to [facility] for management of pain crisis. Discharged with outpatient follow-up. Baby is still in NICU for management of withdrawal symptoms and condition is improving.   Today: No acute events overnight.  Pt denies problems with ambulating, voiding or po intake.  She denies nausea or vomiting.  She denies dizziness or swelling of legs. She has had flatus. She has not had bowel movement.  Lochia Small.  Plan for birth control is IUD.  Method of Feeding: Breast (bottle with breast milk)  Physical Exam:  BP 111/60 mmHg  Pulse 103  Temp(Src) 98 F (36.7 C) (Oral)  Resp 16  Ht 5\' 1"  (1.549 m)  Wt 78.926 kg (174 lb)  BMI 32.89 kg/m2  SpO2 98%  LMP 03/20/2014  Breastfeeding?  Unknown  General: alert, cooperative and no distress Lochia: appropriate Uterine Fundus: firm Incision: N/A DVT Evaluation: No evidence of DVT seen on physical exam. Negative Homan's sign. No significant calf/ankle edema. MSK: right elbow mildly swollen without erythema, lesions, or  drainage. Mildly tender to palpation.   H/H: Lab Results  Component Value Date/Time   HGB 7.1* 12/08/2014 10:24 PM   HGB 8.7* 05/11/2012 07:21 AM   HCT 21.1* 12/08/2014 10:24 PM   HCT 26.9* 05/11/2012 07:21 AM    Discharge Diagnoses: Term Pregnancy-delivered and Sickle Cell Pain Crisis of Right Elbow  Discharge Information: Date: 12/09/2014 Activity: pelvic rest including no sexual intercourse or tampons for 6 weeks Diet: routine  Medications: PNV Breast feeding:  Yes Condition: stable and continued pain in right elbow Instructions: refer to handout Discharge to: [facility] to [facility] for management of Sickle Cell Pain Crisis in right elbow after SW consult  Follow up: [facility] on 12/25/2014, Pain Clinic on 01/14/2015. Call [facility] for appointment for 4-6 week postpartum follow up.   Boy/breast/IUD GBS negative     Medication List    ASK your doctor about these medications        folic acid 1 MG tablet  Commonly known as:  FOLVITE  Take 2 tablets (2 mg total) by mouth daily.     methadone 5 MG tablet  Commonly known as:  DOLOPHINE  Take 1 tablet (5 mg total) by mouth at bedtime.     oxyCODONE 15 MG immediate release tablet  Commonly known as:  ROXICODONE  Take 1 tablet (15 mg total) by mouth every 4 (four) hours as needed for pain.     prenatal multivitamin Tabs tablet  Take 1 tablet by mouth daily at 12 noon.        Caren Macadam, MD 12/09/2014 8:34 PM

## 2014-12-10 LAB — CBC WITH DIFFERENTIAL/PLATELET
BASOS PCT: 0 % (ref 0–1)
Basophils Absolute: 0 10*3/uL (ref 0.0–0.1)
Eosinophils Absolute: 0.8 10*3/uL — ABNORMAL HIGH (ref 0.0–0.7)
Eosinophils Relative: 4 % (ref 0–5)
HCT: 25 % — ABNORMAL LOW (ref 36.0–46.0)
Hemoglobin: 8.3 g/dL — ABNORMAL LOW (ref 12.0–15.0)
LYMPHS ABS: 2.1 10*3/uL (ref 0.7–4.0)
LYMPHS PCT: 11 % — AB (ref 12–46)
MCH: 29.6 pg (ref 26.0–34.0)
MCHC: 33.2 g/dL (ref 30.0–36.0)
MCV: 89.3 fL (ref 78.0–100.0)
MONO ABS: 1.5 10*3/uL — AB (ref 0.1–1.0)
Monocytes Relative: 8 % (ref 3–12)
NEUTROS ABS: 14.5 10*3/uL — AB (ref 1.7–7.7)
Neutrophils Relative %: 77 % (ref 43–77)
Other: 0 %
Platelets: 378 10*3/uL (ref 150–400)
RBC: 2.8 MIL/uL — ABNORMAL LOW (ref 3.87–5.11)
RDW: 17.6 % — AB (ref 11.5–15.5)
WBC: 18.9 10*3/uL — ABNORMAL HIGH (ref 4.0–10.5)

## 2014-12-10 LAB — RETICULOCYTES
RBC.: 2.8 MIL/uL — ABNORMAL LOW (ref 3.87–5.11)
RETIC COUNT ABSOLUTE: 282.8 10*3/uL — AB (ref 19.0–186.0)
Retic Ct Pct: 10.1 % — ABNORMAL HIGH (ref 0.4–3.1)

## 2014-12-10 MED ORDER — IBUPROFEN 800 MG PO TABS: 800.0000 mg | ORAL_TABLET | Freq: Three times a day (TID) | ORAL | Status: DC | PRN

## 2014-12-10 NOTE — Discharge Instructions (Signed)

## 2014-12-10 NOTE — Progress Notes (Signed)
Per MD order, PICC line removed. Cath intact at 36cm. Vaseline pressure gauze to site, pressure held x 5min. No bleeding to site. Pt instructed to keep dressing CDI x 24 hours. Avoid heavy lifting, pushing or pulling x 24 hours,  If bleeding occurs hold pressure, if bleeding does not stop contact MD or go to the ED. Pt does not have any questions. Laron Boorman M 

## 2014-12-10 NOTE — Lactation Note (Signed)
This note was copied from the chart of [place]. Lactation Consultation Note  Patient Name: [name] [id number] Date: 12/10/2014   NICU baby 20 hours old. Discussed mom's daily use of Oxycodone for pain, and any other medications mom may take, and the possible impact on mom's breast milk for baby. Discussed benefits of breast milk and the need to monitor baby for CNS depression depending on mom's use of medications. Enc mom to continue to discuss her use of medications with the baby's health care providers while in the hospital, as well as the baby's pediatrician after D/C.   Mom states that she is going to be picking up a DEBP at [facility] when she leaves the hospital today. Mom aware of OP/BFSG and [place] phone line assistance after D/C.  Maternal Data    Feeding Feeding Type: Breast Milk Nipple Type: Slow - flow Length of feed: 60 min  LATCH Score/Interventions                      Lactation Tools Discussed/Used     Consult Status      Inocente Salles 12/10/2014, 1:46 PM

## 2014-12-10 NOTE — Plan of Care (Signed)
Problem: Phase II Progression Outcomes Goal: Pain controlled on oral analgesia Outcome: Completed/Met Date Met:  12/10/14 Home today on PO meds using much less PCA dilaudid over night

## 2014-12-10 NOTE — Addendum Note (Signed)
Addendum  created 12/10/14 1715 by Jillyn Hidden, MD   Modules edited: Anesthesia Responsible Staff

## 2014-12-10 NOTE — Consult Note (Signed)
SICKLE CELL SERVICE CONSULT NOTE  [name] RWE:[id number] DOB: [birth year] DOA: 12/05/2014 PCP: Angelica Chessman, MD  Assessment/Plan: Active Problems:   Labor abnormal  1. Hb SS with crisis: Pt has done very well with the Toradol in controlling the acute pain in her RUE. She has used 18.56 mg on the PCA for the last 24 hours. Recommend that she be discharged home on Methadone 5  Mg q HS,  Oxycodone 15 mg q 4 hours PRN. Ibuprofen 600-800 mg po q 8 hours PRN. With regard to her Hydrea, she should defer resuming Hydrea until she has stopped breast feeding and been evaluated by her PMD for baseline indices of Hb/ HCT, ANC. Reticulocyte and platelets. Would also check Ferritin as an out patient and decide on need for Jadenu as she had multiple partial exchange transfusions as an out-patient. Follow up with [facility] within 1 month. Above information discussed with patient in the presence of nurse Epifanio Lesches, RN. 2. Leukocytosis: Likely secondary to inflammation and crisis. No repeat labs available today. However she has no signs of infection. 3. Anemia: Pt has a baseline Hb of 7. She is currently at baseline. 4. Chronic pain: Continue Methadone 5 mg q HS.  Code Status: Full Code Disposition Plan: Ready for discharge from Sickle Cell perspective.  MATTHEWS,MICHELLE A.  Pager [phone]. If 7PM-7AM, please contact night-coverage.  12/10/2014, 9:39 AM  LOS: 5 days   Objective: Filed Vitals:   12/09/14 2241 12/10/14 0200 12/10/14 0520 12/10/14 0709  BP:   115/54   Pulse:   83   Temp:   98.5 F (36.9 C)   TempSrc:   Oral   Resp: 20 16 16 13   Height:      Weight:      SpO2: 98% 98%  98%   Weight change:   Intake/Output Summary (Last 24 hours) at 12/10/14 0939 Last data filed at 12/09/14 2316  Gross per 24 hour  Intake     10 ml  Output      0 ml  Net     10 ml    General: Alert, awake, oriented x3, in no acute distress.  HEENT: [place]/AT PEERL, EOMI, anicteric. Neck:  Trachea midline,  no masses, no thyromegal,y no JVD, no carotid bruit OROPHARYNX:  Moist, No exudate/ erythema/lesions.  Heart: Regular rate and rhythm, without murmurs, rubs, gallops, PMI non-displaced, no heaves or thrills on palpation.  Lungs: Clear to auscultation, no wheezing or rhonchi noted. No increased vocal fremitus resonant to percussion  Abdomen: Soft, nontender, nondistended, positive bowel sounds, no masses no hepatosplenomegaly noted..  Neuro: No focal neurological deficits noted cranial nerves II through XII grossly intact. DTRs 2+ bilaterally upper and lower extremities. Strength 5 out of 5 in bilateral upper and lower extremities. Musculoskeletal: No warm swelling or erythema around joints, no spinal tenderness noted. Specifically no swelling or tenderness around right elbow, forearm and arm.   Data Reviewed: Basic Metabolic Panel:  Recent Labs Lab 12/07/14 1700  NA 136  K 3.5  CL 109  CO2 21*  GLUCOSE 95  BUN 6  CREATININE 0.45  CALCIUM 8.5*   Liver Function Tests: No results for input(s): AST, ALT, ALKPHOS, BILITOT, PROT, ALBUMIN in the last 168 hours. No results for input(s): LIPASE, AMYLASE in the last 168 hours. No results for input(s): AMMONIA in the last 168 hours. CBC:  Recent Labs Lab 12/05/14 0905 12/08/14 2224 12/09/14 0940  WBC 13.7* 22.1*  --   HGB 9.7*  7.1* 7.5*  HCT 28.5* 21.1* 22.2*  MCV 88.5 89.0  --   PLT 292 274  --    Cardiac Enzymes: No results for input(s): CKTOTAL, CKMB, CKMBINDEX, TROPONINI in the last 168 hours. BNP (last 3 results) No results for input(s): BNP in the last 8760 hours.  ProBNP (last 3 results) No results for input(s): PROBNP in the last 8760 hours.  CBG: No results for input(s): GLUCAP in the last 168 hours.  No results found for this or any previous visit (from the past 240 hour(s)).   Studies: US Ob Follow Up  11/14/2014   OBSTETRICAL ULTRASOUND: This exam was performed within a [name]  [facility]. The OB US report was generated in the [facility] system, and faxed to the ordering physician.   This report is available in the [employer]. See the [facility] Obstetric US report via the Image Link.   Scheduled Meds: . ketorolac  30 mg Intravenous 4 times per day  . measles, mumps and rubella vaccine  0.5 mL Subcutaneous Once  . methadone  5 mg Oral Q24H  . oxyCODONE  15 mg Oral Once  . prenatal multivitamin  1 tablet Oral Q1200  . senna-docusate  2 tablet Oral Q24H  . sodium chloride  10-40 mL Intracatheter Q12H  . sodium chloride  3 mL Intravenous Q12H  . Tdap  0.5 mL Intramuscular Once   Continuous Infusions: . lactated ringers 125 mL/hr at 12/10/14 [phone]

## 2014-12-10 NOTE — Progress Notes (Signed)
At approximately 2400 this morning the [facility] went into the patients room to flush her PICC line and witnessed the patient standing at the sink giving herself a bath with no assistance. After that time she received her IV Tordol and po Benadryl and every time this RN went into the room the patient was asleep and was not awakened by my turning the light on or the IV beeping. She was arousable when I needed to speak to her but went right back to sleep.

## 2014-12-14 IMAGING — CR DG KNEE COMPLETE 4+V*L*
4 series · 4 of 4 positions shown · non-contrast
Comparison: 05/28/2013

CLINICAL DATA: Left knee pain. No injury. History of sickle cell
disease.

EXAM:
LEFT KNEE - COMPLETE 4+ VIEW

[view not recorded (1 of 4)]
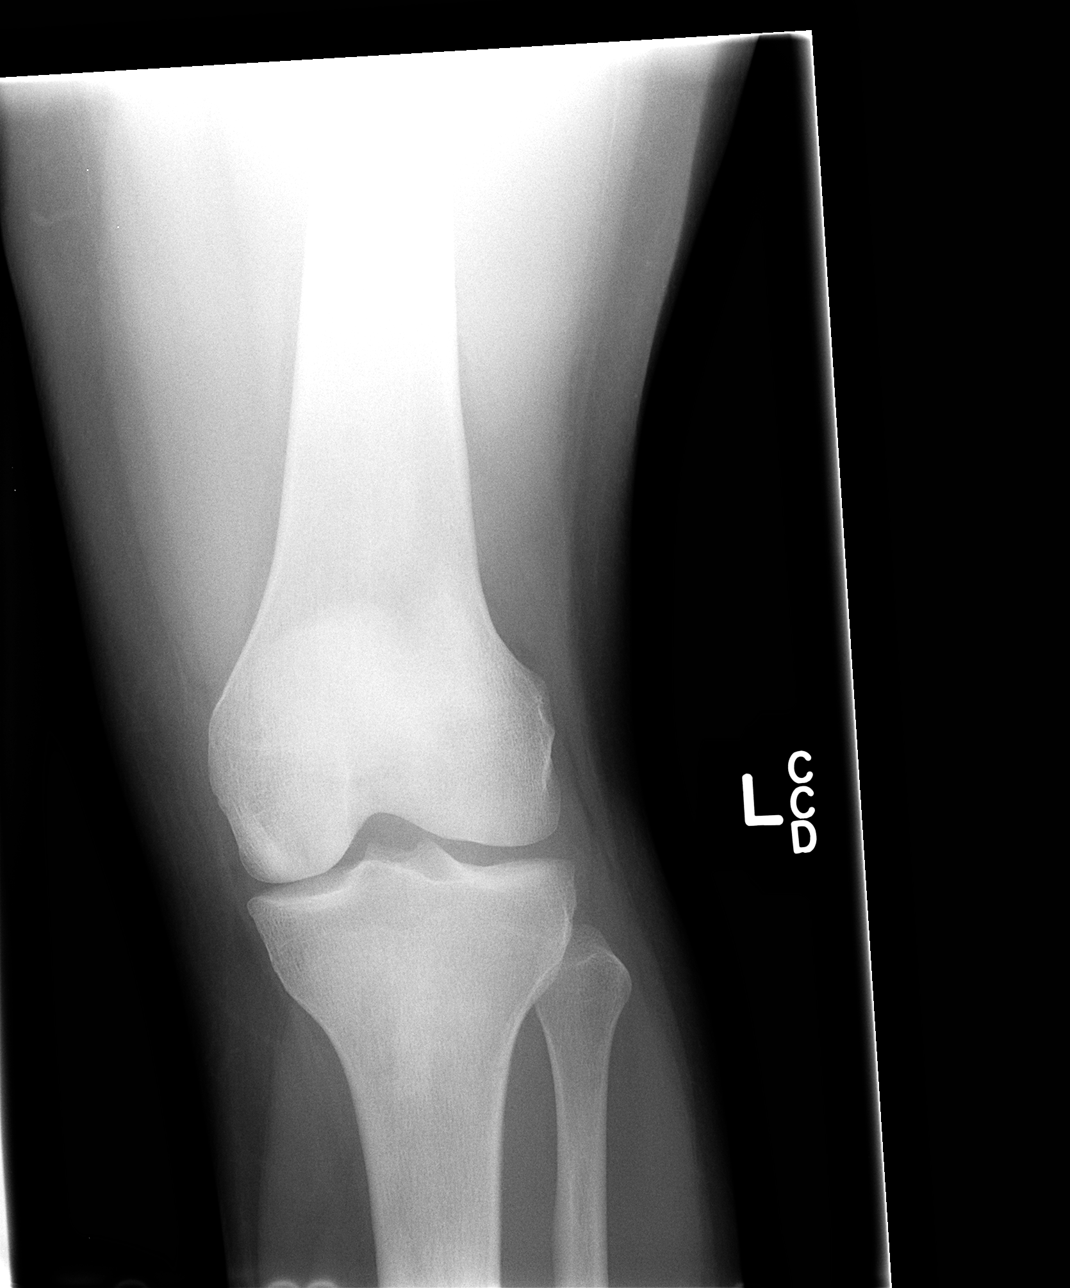

[view not recorded (2 of 4)]
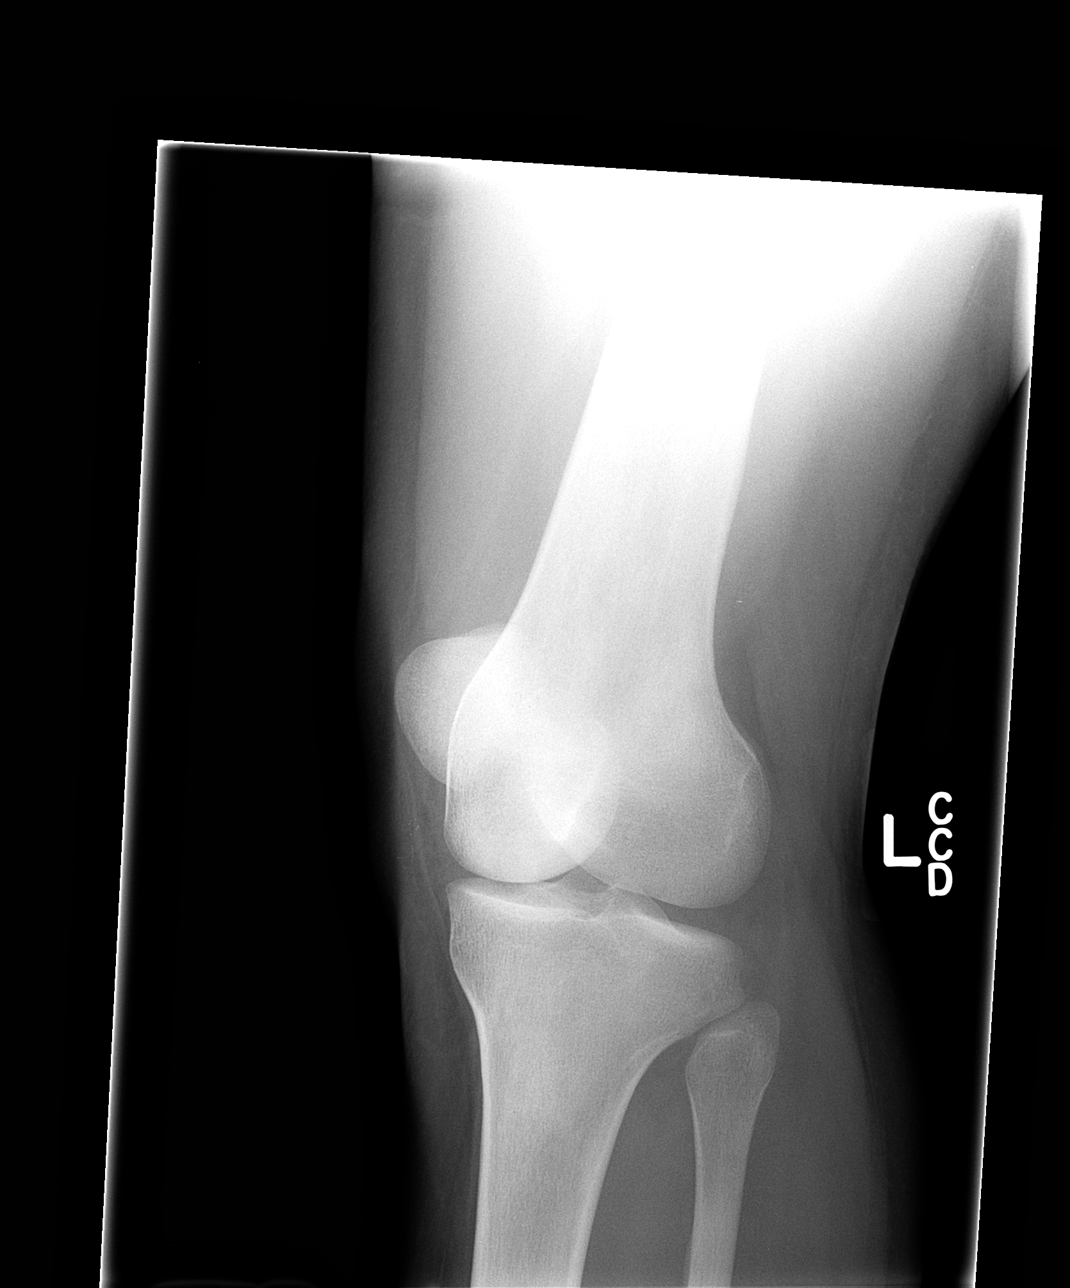

[view not recorded (3 of 4)]
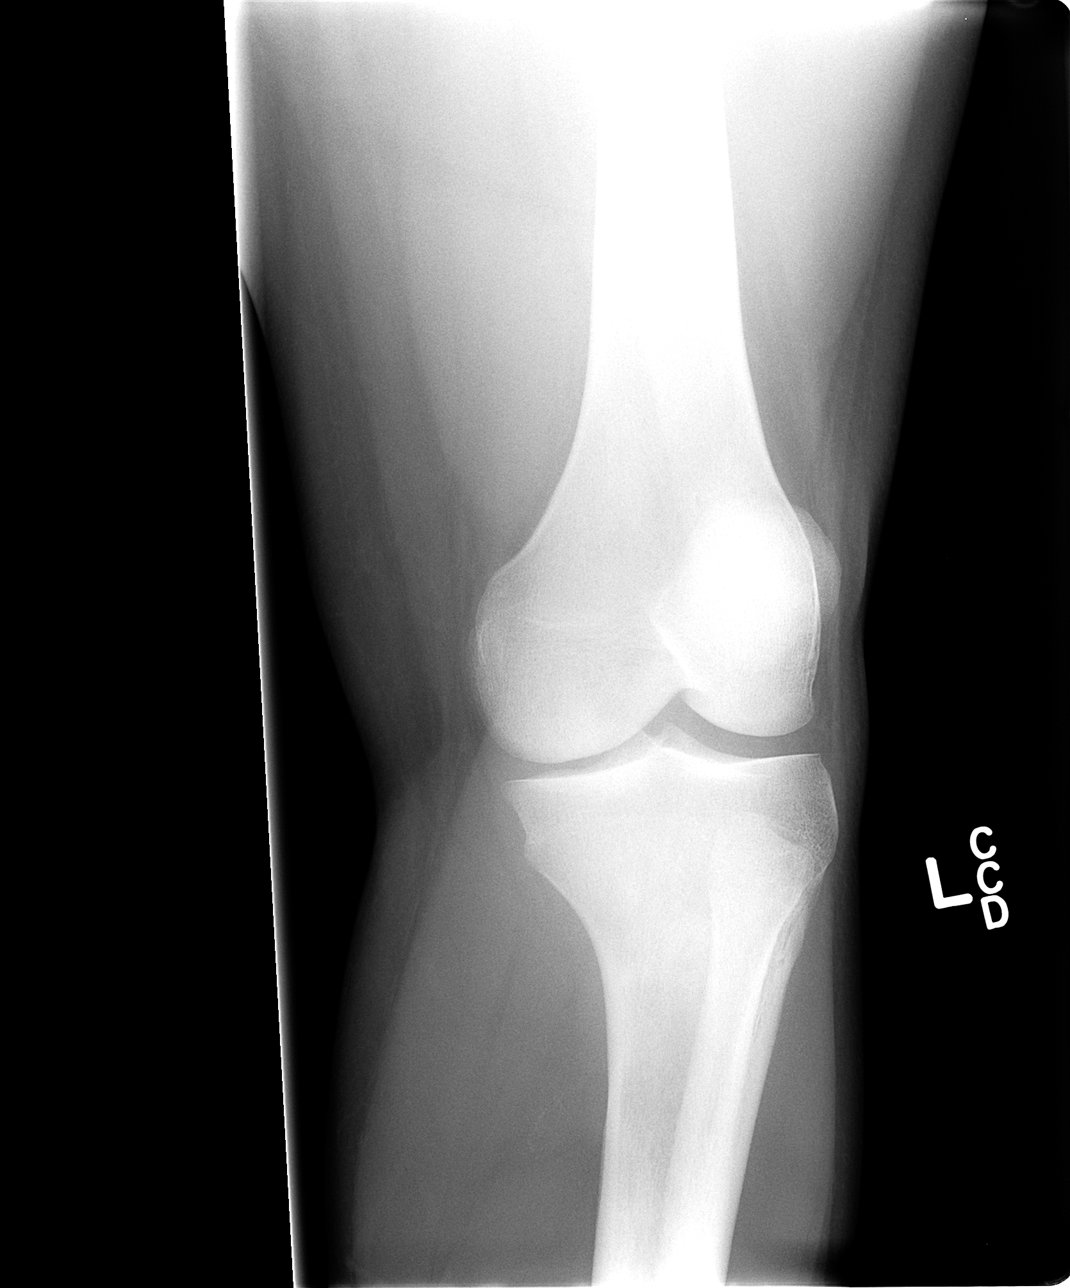

[view not recorded (4 of 4)]
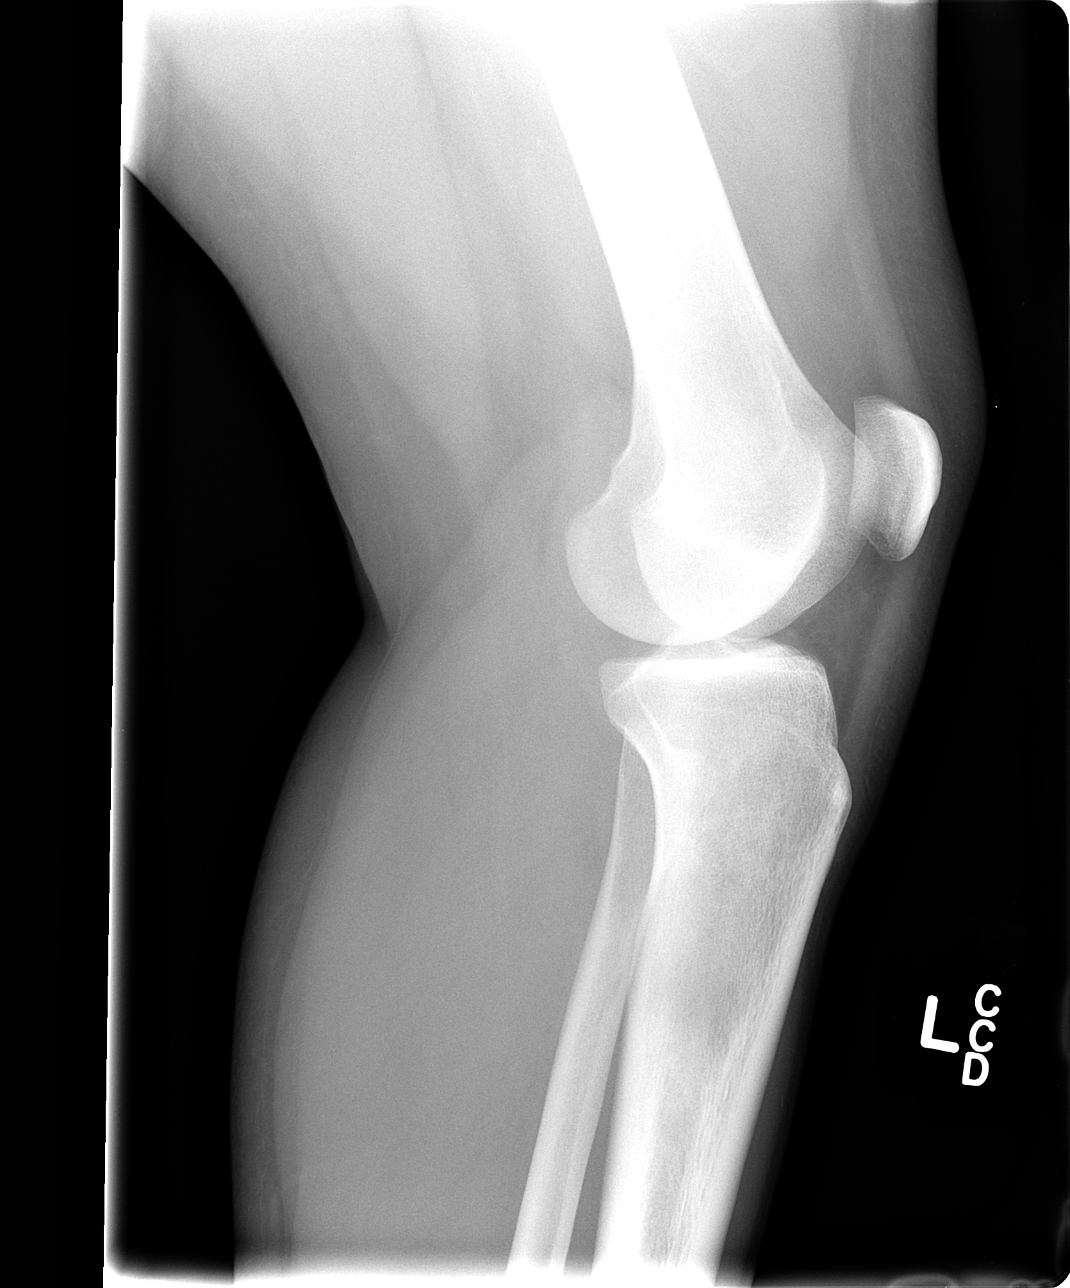

[4 of 4 positions shown; findings below may reference images not displayed]

FINDINGS: Subtle overall increased bone density compatible with history of
sickle cell disease. No evidence of fracture, dislocation or joint
effusion. Subtle stable sclerosis over the medial femoral condyle.
IMPRESSION: No acute findings.

## 2014-12-14 IMAGING — CR DG CHEST 2V
2 series · 2 of 2 positions shown · non-contrast
Comparison: Chest radiograph performed 01/13/2014

CLINICAL DATA: Sickle cell crisis.  Chest pain.

EXAM:
CHEST  2 VIEW

[w chest lat]
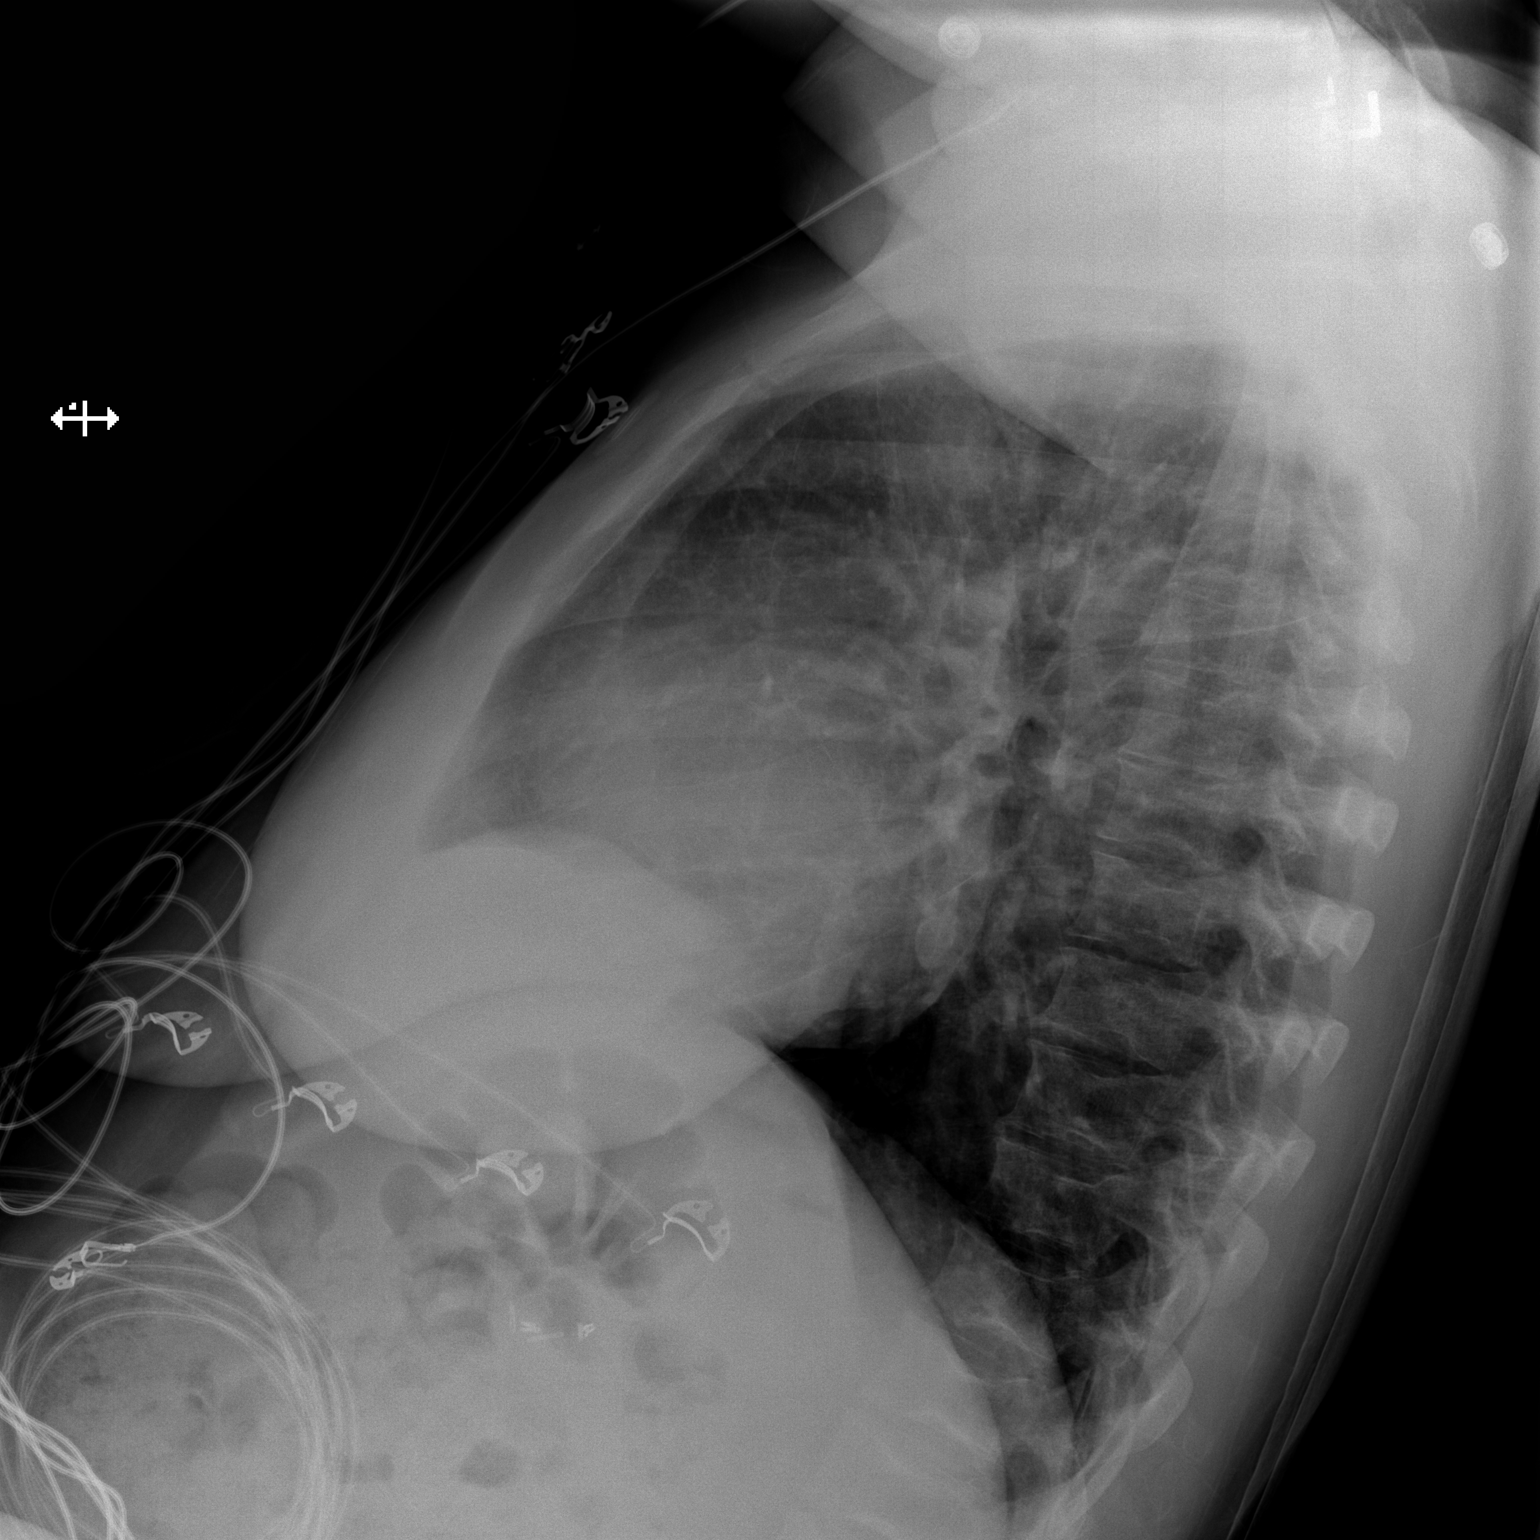

[x chest ap]
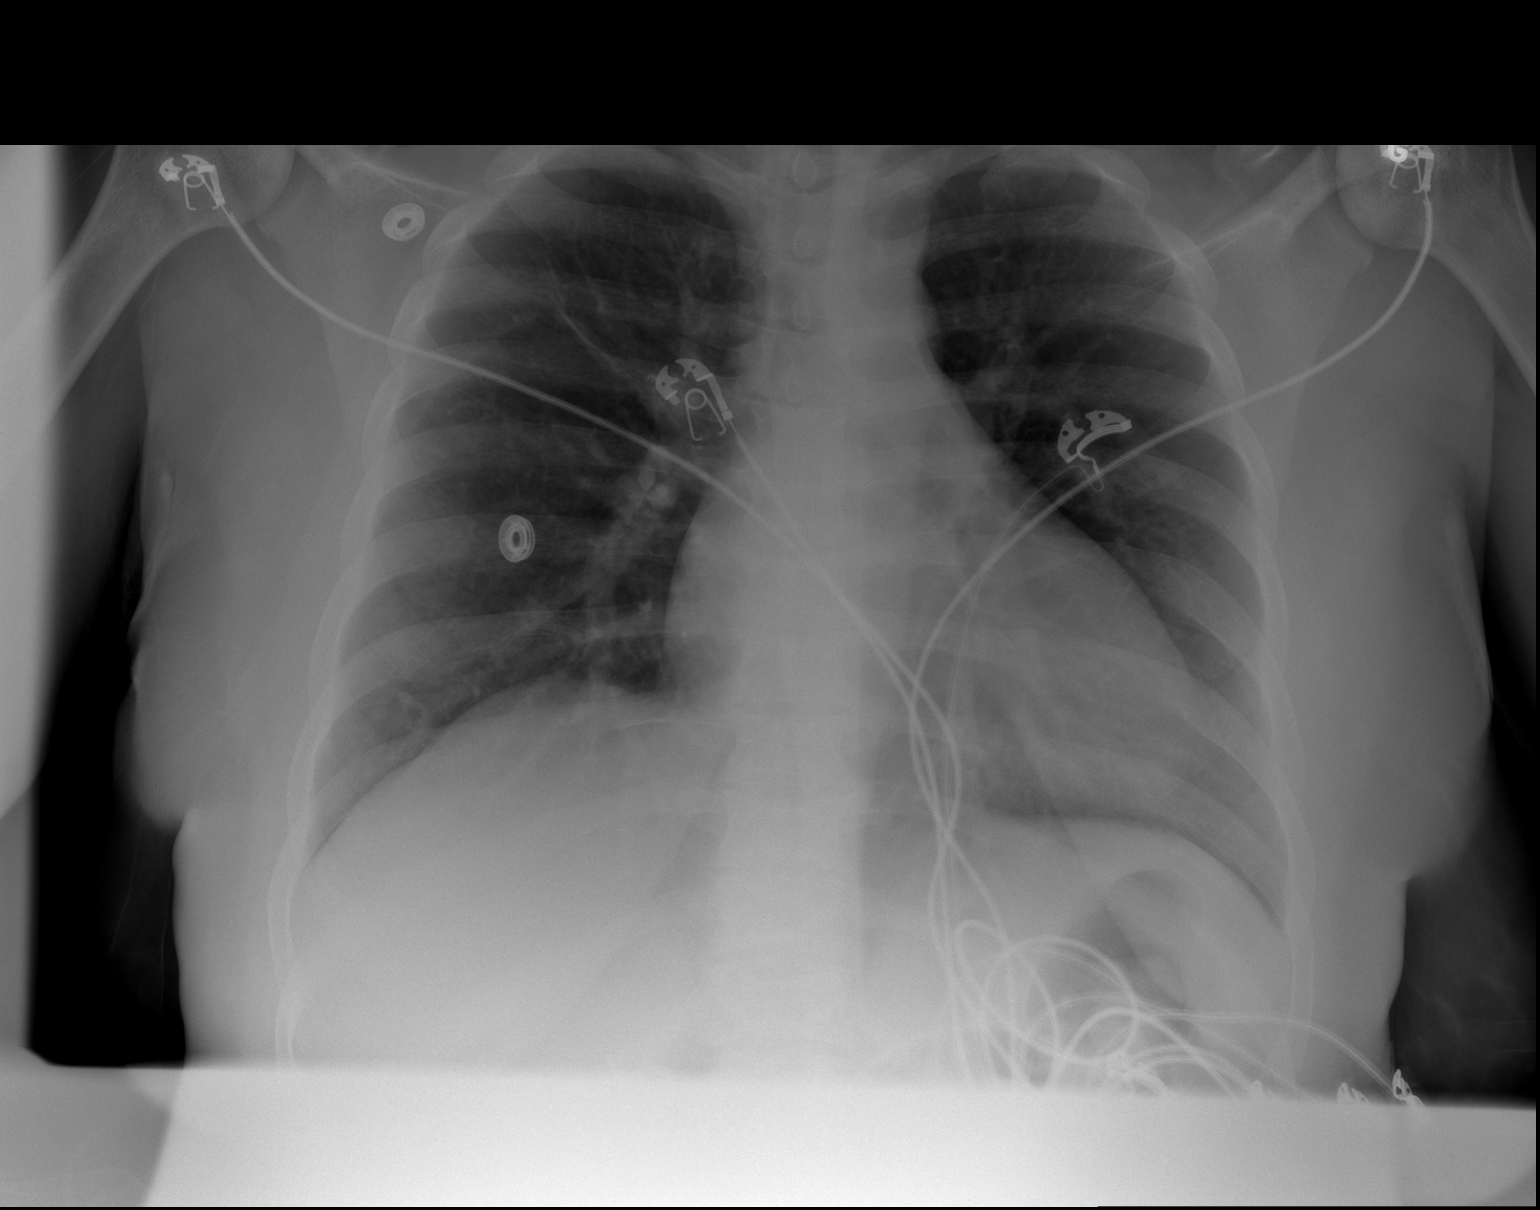

[2 of 2 positions shown; findings below may reference images not displayed]

FINDINGS: The lungs are well-aerated and clear. There is no evidence of focal
opacification, pleural effusion or pneumothorax.

The heart is normal in size; the mediastinal contour is within
normal limits. No acute osseous abnormalities are seen. H-shaped
vertebral bodies are compatible with the patient's known sickle cell
disease. Clips are noted within the right upper quadrant, reflecting
prior cholecystectomy.
IMPRESSION: No acute cardiopulmonary process seen.

## 2014-12-15 ENCOUNTER — Telehealth: Payer: Self-pay | Admitting: Family Medicine

## 2014-12-15 ENCOUNTER — Other Ambulatory Visit: Payer: Self-pay | Admitting: Family Medicine

## 2014-12-15 NOTE — Telephone Encounter (Signed)
Refill request for oxycodone 15mg . LOV 12/03/2014. Please advise. Thanks!

## 2014-12-16 ENCOUNTER — Ambulatory Visit (INDEPENDENT_AMBULATORY_CARE_PROVIDER_SITE_OTHER): Payer: Medicare Other | Admitting: Family Medicine

## 2014-12-16 ENCOUNTER — Other Ambulatory Visit: Payer: Self-pay | Admitting: Family Medicine

## 2014-12-16 VITALS — BP 107/70 | HR 99 | Temp 98.9°F | Resp 16 | Ht 61.0 in | Wt 162.0 lb

## 2014-12-16 DIAGNOSIS — G8929 Other chronic pain: Secondary | ICD-10-CM

## 2014-12-16 DIAGNOSIS — D571 Sickle-cell disease without crisis: Secondary | ICD-10-CM

## 2014-12-16 MED ORDER — OXYCODONE HCL 15 MG PO TABS: 15.0000 mg | ORAL_TABLET | ORAL | Status: DC | PRN

## 2014-12-16 NOTE — Patient Instructions (Signed)
You may have one more refill, then to be taken over by pain clinic.

## 2014-12-17 ENCOUNTER — Encounter: Payer: Self-pay | Admitting: Family Medicine

## 2014-12-17 NOTE — Progress Notes (Signed)
Patient ID: [name], female   DOB: 1991-03-22, 24 y.o.   MRN: [id number]   [name], is a 24 y.o. female  YQM:[id number]  [id number]:[id number]  DOB - 1991-01-27  CC:  Chief Complaint  Patient presents with  . Follow-up       HPI: [name] is a 24 y.o. female here for follow-up SCD and needing refill of her oxycodone for [place] chronic pain. She has recently delivered a baby and her Dilaudid was refilled by her OB.She reports she is in her usual health, no change in her pain patterns. No chest pain, shortness of breath, severe headaches, nausea, vomiting or diarrhea. No hip pain.She has been referred to pain clinic and hematology. Her pain clinic appointment is at the end of the month  Allergies  Allergen Reactions  . Carrot [Daucus Carota] Hives, Swelling and Rash    Dietary:  Please do not send any form of carrot to patient  . Carrot Oil Hives and Swelling  . Latex Rash   Past Medical History  Diagnosis Date  . Miscarriage 03/22/2011    Pt reports 2 miscarriages.  . Depression 01/06/2011  . GERD (gastroesophageal reflux disease) 02/17/2011  . Trichotillomania     h/o  . Blood transfusion     "lots"  . Sickle cell anemia with crisis   . Exertional dyspnea     "sometimes"  . Migraines 11/08/11    "@ least twice/month"  . Chronic back pain     "very severe; have knot in my back; from tight muscle; take RX and exercise for it"  . Mood swings 11/08/11    "I go back and forth; real bad"  . Sickle cell anemia    Current Outpatient Prescriptions on File Prior to Visit  Medication Sig Dispense Refill  . folic acid (FOLVITE) 1 MG tablet Take 2 tablets (2 mg total) by mouth daily. (Patient taking differently: Take 1 mg by mouth daily. ) 60 tablet 2  . ibuprofen (ADVIL,MOTRIN) 800 MG tablet Take 1 tablet (800 mg total) by mouth every 8 (eight) hours as needed. 90 tablet 3  . methadone (DOLOPHINE) 5 MG tablet Take 1 tablet (5 mg total) by mouth at bedtime. 30 tablet 0  .  Prenatal Vit-Fe Fumarate-FA (PRENATAL MULTIVITAMIN) TABS tablet Take 1 tablet by mouth daily at 12 noon.    . [DISCONTINUED] medroxyPROGESTERone (DEPO-PROVERA) 150 MG/ML injection Inject 150 mg into the muscle every 3 (three) months. Last dose 04/20/11     No current facility-administered medications on file prior to visit.   Family History  Problem Relation Age of Onset  . Diabetes Maternal Grandmother   . Diabetes Paternal Grandmother   . Hypertension Paternal Grandmother   . Diabetes Maternal Grandfather   . Sickle cell trait Mother   . Sickle cell trait Father    History   Social History  . Marital Status: Single    Spouse Name: N/A  . Number of Children: N/A  . Years of Education: N/A   Occupational History  . Not on file.   Social History Main Topics  . Smoking status: Former Smoker -- 0.25 packs/day for 1 years    Types: Cigarettes    Quit date: 03/25/2013  . Smokeless tobacco: Never Used  . Alcohol Use: No     Comment: pt states she quit marijuan in May 2013. Rare ETOH, + cigarettes.  She is enrolled in school  . Drug Use: No  . Sexual Activity: Yes  Birth Control/ Protection: None   Other Topics Concern  . 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 Gastrointestinal: Negative for abdominal distention, abdominal pain, nausea, vomiting, diarrhea. Positive for some constipation Genitourinary: Denies problems Musculoskeletal: Admits to usual MS pain related to SCD in her joints particularly right arm. Also admits to some back pain Neurological: Denies problems Hematological: Denies  problems Psychiatric/Behavioral: Denies depression, anxiety.   Objective:   Filed Vitals:   12/16/14 1548  BP: 107/70  Pulse: 99  Temp: 98.9 F (37.2 C)  Resp: 16    Physical Exam: Constitutional: Patient appears well-developed and well-nourished. No distress. HENT: Normocephalic, atraumatic, External right and left ear normal. Oropharynx is clear and moist.  Eyes: Conjunctivae and EOM are normal. PERRLA, no scleral icterus. Neck: Normal ROM. Neck supple. No lymphadenopathy, No thyromegaly. CVS: RRR, S1/S2 +, no murmurs, no gallops, no rubs Pulmonary: Effort and breath sounds normal, no stridor, rhonchi, wheezes, rales.  Abdominal: Soft. Normoactive BS,, no distension, tenderness, rebound or guarding.  Musculoskeletal: Normal range of motion. No edema and no tenderness.  Neuro: Alert.Normal muscle tone coordination. Non-focal Skin: Skin is warm and dry. No rash noted. Not diaphoretic. No erythema. No pallor. Psychiatric: Normal mood and affect. Behavior, judgment, thought content normal.  Lab Results  Component Value Date   WBC 18.9* 12/10/2014   HGB 8.3* 12/10/2014   HCT 25.0* 12/10/2014   MCV 89.3 12/10/2014   PLT 378 12/10/2014   Lab Results  Component Value Date   CREATININE 0.45 12/07/2014   BUN 6 12/07/2014   NA 136 12/07/2014   K 3.5 12/07/2014   CL 109 12/07/2014   CO2 21* 12/07/2014    No results found for: HGBA1C Lipid Panel  No results found for: CHOL, TRIG, HDL, CHOLHDL, VLDL, LDLCALC     Assessment and plan:   Sickle Cell Disease with chronic pain,  -refill of oxycodone 15 mg, # 90, to be used every 4 hours for severe pain. -She may have one refill prior to her pain clinic appointment. -Have reminded her of her contract and that she is to receive opoids only from [place].  Follow-up in 3 months for routine evaluation.  The patient was given clear instructions to go to ER or return to medical center if symptoms don't improve, worsen or new problems  develop. The patient verbalized understanding. The patient was told to call to get lab results if they haven't heard anything in the next week.       Micheline Chapman, MSN, FNP-BC   12/17/2014, 8:23 AM

## 2014-12-23 ENCOUNTER — Ambulatory Visit: Payer: Self-pay

## 2014-12-23 NOTE — Lactation Note (Signed)
This note was copied from the chart of [place]. Lactation Consultation Note  Patient Name: [name] WPTYY'P Date: 12/23/2014 Reason for consult: Follow-up assessment  NICU baby 69 weeks old. Mom roomed-in with baby overnight and is being discharged. Mom states that her supply is great now and she is having no issues with pumping. Mom aware of OP/BFSG and [place] phone line assistance after discharge. Enc mom to call for assistance as needed.  Maternal Data    Feeding Feeding Type: Formula Nipple Type: Slow - flow Length of feed:  (fed by mother)  [facility] Score/Interventions                      Lactation Tools Discussed/Used     Consult Status Consult Status: Complete    Lesli Albee, Johnna Bollier 12/23/2014, 11:17 AM

## 2014-12-25 ENCOUNTER — Telehealth: Payer: Self-pay

## 2014-12-25 NOTE — Telephone Encounter (Signed)
Refill request for methadone 5mg  and oxycodone 15mg . LOV 12/16/2014. Please advise. Thanks!

## 2014-12-26 ENCOUNTER — Other Ambulatory Visit: Payer: Self-pay | Admitting: Family Medicine

## 2014-12-26 NOTE — Telephone Encounter (Signed)
Unless I am counting incorrectly, one is not due until next Tuesday and the other on the 18th

## 2014-12-30 ENCOUNTER — Other Ambulatory Visit: Payer: Self-pay | Admitting: Family Medicine

## 2014-12-30 MED ORDER — OXYCODONE HCL 15 MG PO TABS: 15.0000 mg | ORAL_TABLET | ORAL | Status: DC | PRN

## 2014-12-30 MED ORDER — METHADONE HCL 5 MG PO TABS: 5.0000 mg | ORAL_TABLET | Freq: Every day | ORAL | Status: DC

## 2015-01-01 IMAGING — CR DG CHEST 2V
2 series · 2 of 2 positions shown · non-contrast
Comparison: 01/29/2014

CLINICAL DATA: Sickle cell crisis.  Nonsmoker.

EXAM:
CHEST  2 VIEW

[w chest pa]
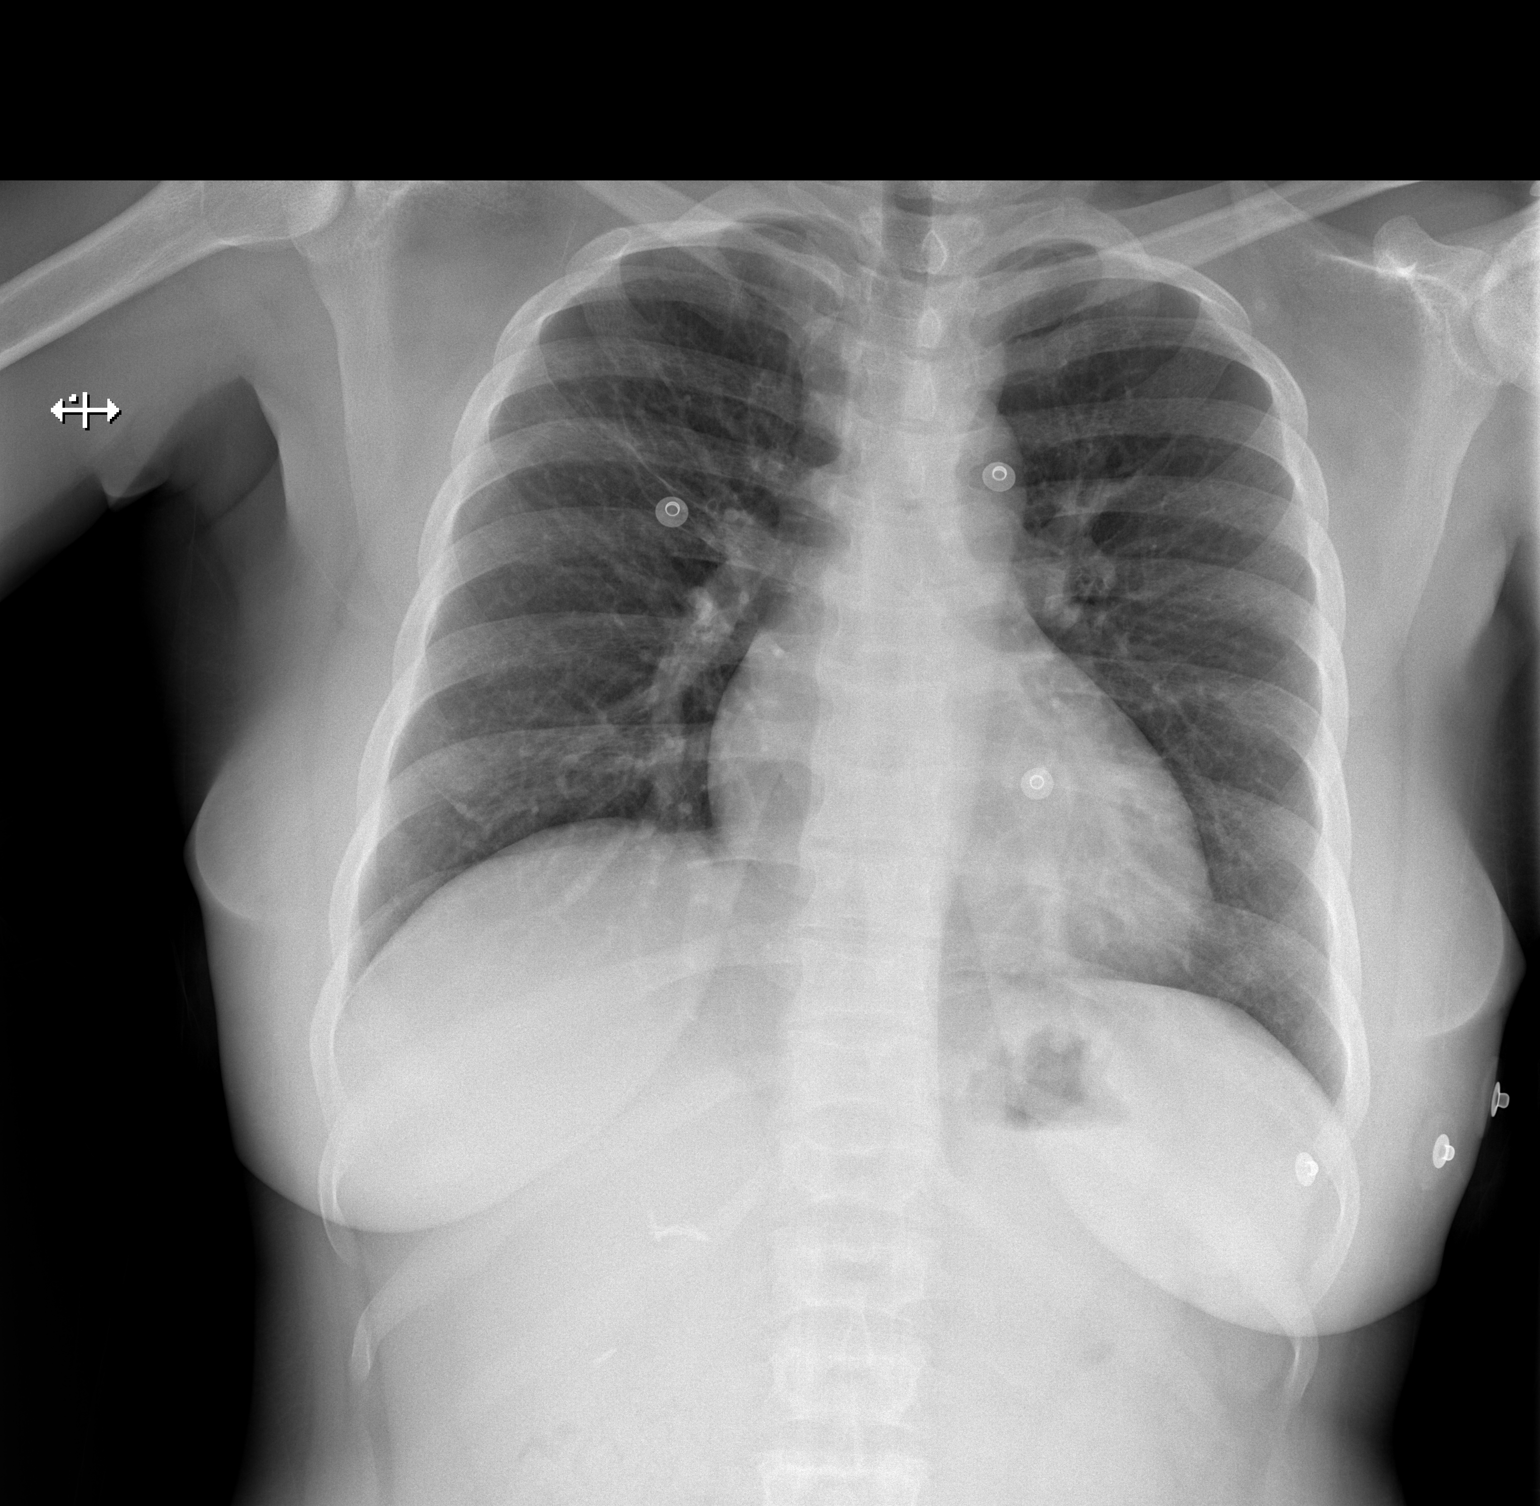

[w chest lat]
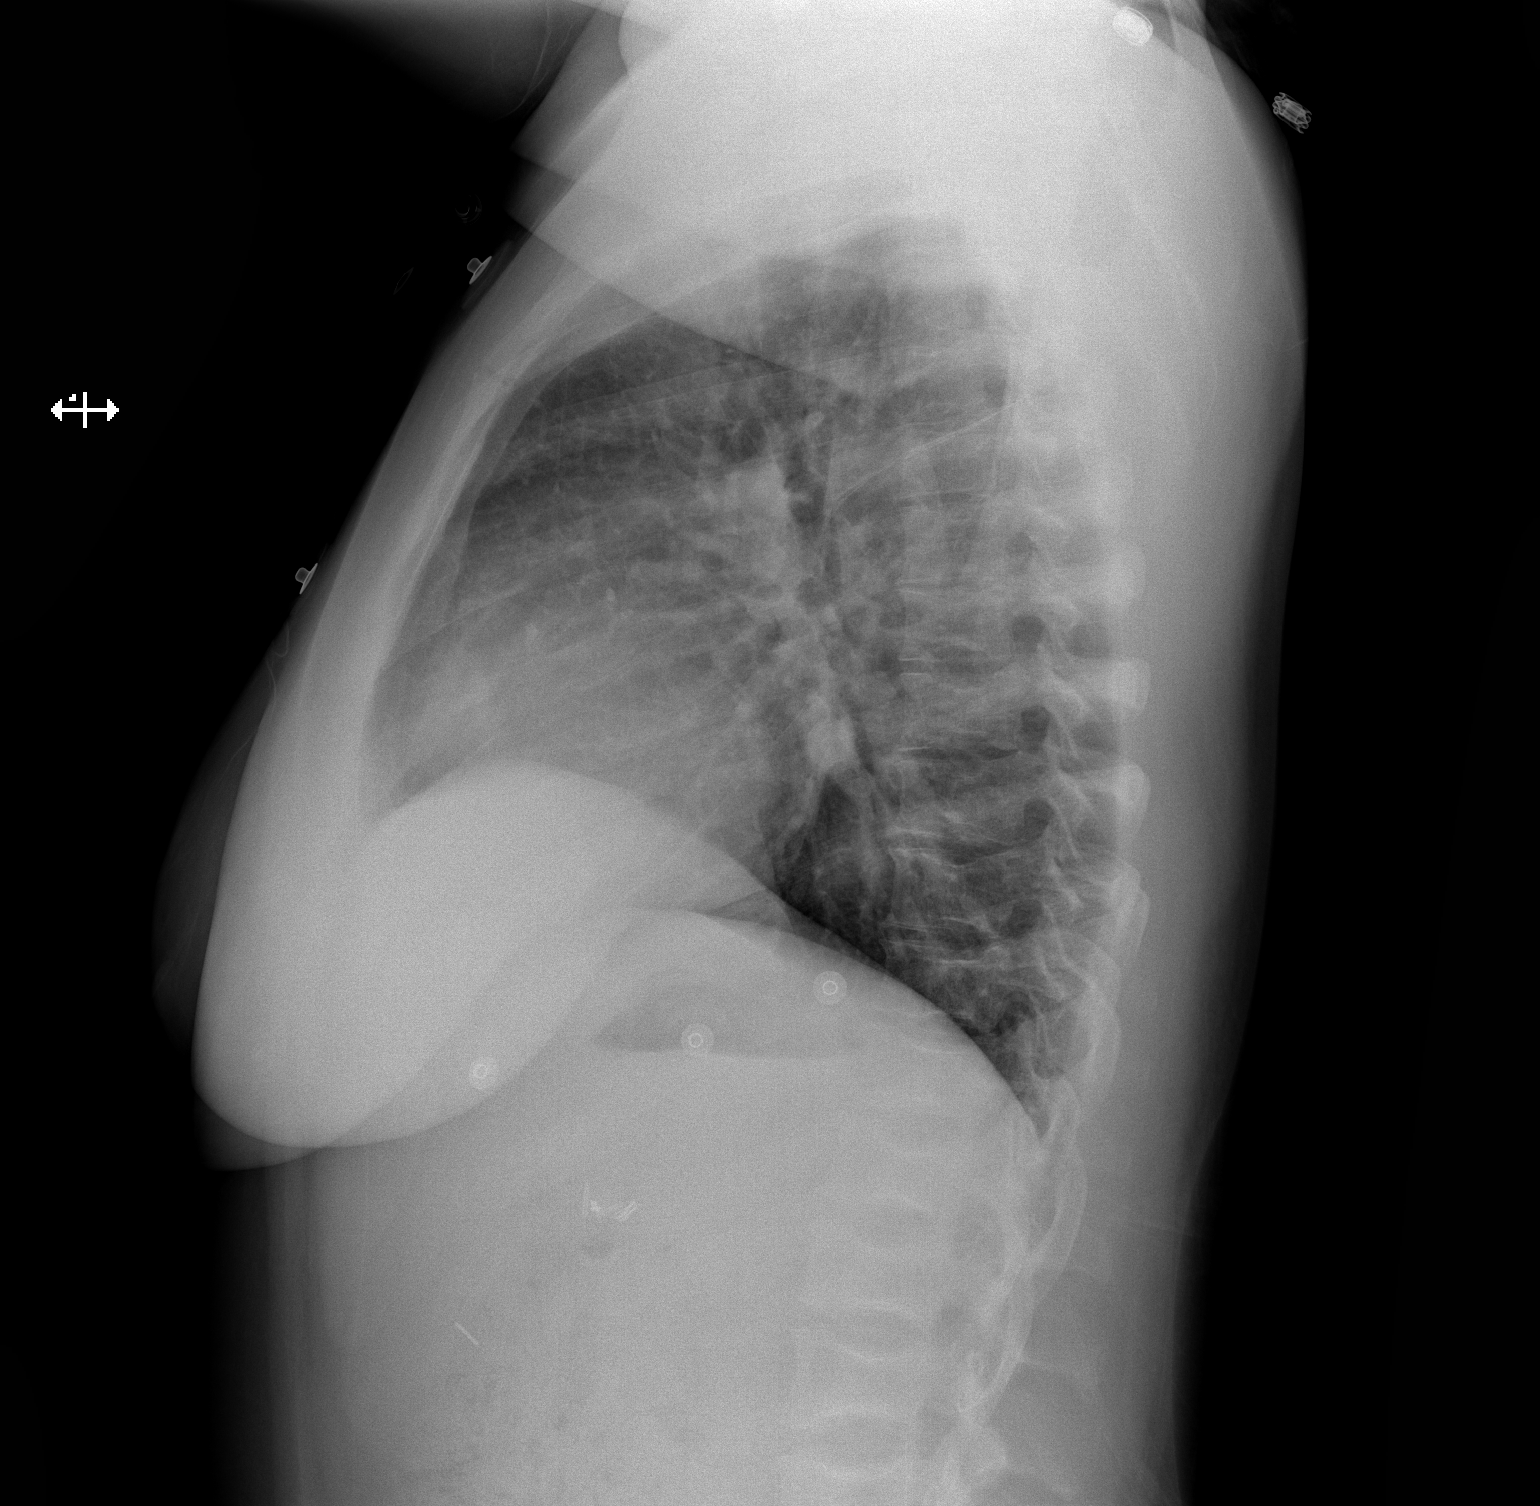

[2 of 2 positions shown; findings below may reference images not displayed]

FINDINGS: The heart size and mediastinal contours are within normal limits.
Both lungs are clear. Biconcave vertebral bodies compatible with
known sickle cell disease. Right upper quadrant surgical clips.
IMPRESSION: No active cardiopulmonary disease.

## 2015-01-08 ENCOUNTER — Ambulatory Visit: Payer: Medicare Other | Admitting: Obstetrics & Gynecology

## 2015-01-08 ENCOUNTER — Ambulatory Visit: Payer: Medicare Other | Admitting: Certified Nurse Midwife

## 2015-01-08 ENCOUNTER — Telehealth: Payer: Self-pay | Admitting: Internal Medicine

## 2015-01-09 NOTE — Telephone Encounter (Signed)
Refill request for oxycodone 15 mg LOV 12/16/2014. Thanks!

## 2015-01-12 ENCOUNTER — Other Ambulatory Visit: Payer: Self-pay | Admitting: Family Medicine

## 2015-01-12 MED ORDER — OXYCODONE HCL 15 MG PO TABS: 15.0000 mg | ORAL_TABLET | ORAL | Status: DC | PRN

## 2015-01-15 ENCOUNTER — Telehealth: Payer: Self-pay

## 2015-01-15 NOTE — Telephone Encounter (Signed)
Received fax from [facility], Patient NO SHOWED for appointment on 01/14/2015 @1pm . Thanks!

## 2015-01-16 ENCOUNTER — Ambulatory Visit: Payer: Medicare Other | Admitting: Family Medicine

## 2015-01-22 ENCOUNTER — Telehealth: Payer: Self-pay | Admitting: Family Medicine

## 2015-01-22 DIAGNOSIS — G8929 Other chronic pain: Secondary | ICD-10-CM

## 2015-01-22 NOTE — Telephone Encounter (Signed)
Refill request for oxycodone 15mg  and Methadone 5mg . LOV 12/16/2014. Please advise. Thanks!

## 2015-01-23 ENCOUNTER — Other Ambulatory Visit: Payer: Self-pay | Admitting: Family Medicine

## 2015-01-24 ENCOUNTER — Inpatient Hospital Stay (HOSPITAL_COMMUNITY)
Admission: EM | Admit: 2015-01-24 | Discharge: 2015-01-27 | DRG: 812 | Disposition: A | Discharge status: Home / Self Care | Payer: Medicare Other | Attending: Internal Medicine | Admitting: Internal Medicine

## 2015-01-24 ENCOUNTER — Encounter (HOSPITAL_COMMUNITY): Payer: Self-pay | Admitting: Emergency Medicine

## 2015-01-24 DIAGNOSIS — D72829 Elevated white blood cell count, unspecified: Secondary | ICD-10-CM | POA: Diagnosis present

## 2015-01-24 DIAGNOSIS — F329 Major depressive disorder, single episode, unspecified: Secondary | ICD-10-CM | POA: Diagnosis not present

## 2015-01-24 DIAGNOSIS — Z79899 Other long term (current) drug therapy: Secondary | ICD-10-CM

## 2015-01-24 DIAGNOSIS — D638 Anemia in other chronic diseases classified elsewhere: Secondary | ICD-10-CM | POA: Diagnosis not present

## 2015-01-24 DIAGNOSIS — Z8249 Family history of ischemic heart disease and other diseases of the circulatory system: Secondary | ICD-10-CM

## 2015-01-24 DIAGNOSIS — Z832 Family history of diseases of the blood and blood-forming organs and certain disorders involving the immune mechanism: Secondary | ICD-10-CM

## 2015-01-24 DIAGNOSIS — D5700 Hb-SS disease with crisis, unspecified: Secondary | ICD-10-CM | POA: Diagnosis not present

## 2015-01-24 DIAGNOSIS — G894 Chronic pain syndrome: Secondary | ICD-10-CM | POA: Diagnosis present

## 2015-01-24 DIAGNOSIS — Z91018 Allergy to other foods: Secondary | ICD-10-CM

## 2015-01-24 DIAGNOSIS — Z87891 Personal history of nicotine dependence: Secondary | ICD-10-CM

## 2015-01-24 DIAGNOSIS — R4586 Emotional lability: Secondary | ICD-10-CM | POA: Diagnosis present

## 2015-01-24 DIAGNOSIS — K219 Gastro-esophageal reflux disease without esophagitis: Secondary | ICD-10-CM | POA: Diagnosis not present

## 2015-01-24 DIAGNOSIS — M549 Dorsalgia, unspecified: Secondary | ICD-10-CM | POA: Diagnosis not present

## 2015-01-24 DIAGNOSIS — Z79891 Long term (current) use of opiate analgesic: Secondary | ICD-10-CM

## 2015-01-24 DIAGNOSIS — Z91040 Latex allergy status: Secondary | ICD-10-CM

## 2015-01-24 DIAGNOSIS — Z833 Family history of diabetes mellitus: Secondary | ICD-10-CM

## 2015-01-24 LAB — RETICULOCYTES
RBC.: 2.86 MIL/uL — ABNORMAL LOW (ref 3.87–5.11)
Retic Count, Absolute: 646.4 10*3/uL — ABNORMAL HIGH (ref 19.0–186.0)
Retic Ct Pct: 22.6 % — ABNORMAL HIGH (ref 0.4–3.1)

## 2015-01-24 LAB — CBC WITH DIFFERENTIAL/PLATELET
Basophils Absolute: 0.1 10*3/uL (ref 0.0–0.1)
Basophils Relative: 1 % (ref 0–1)
Eosinophils Absolute: 0.3 10*3/uL (ref 0.0–0.7)
Eosinophils Relative: 2 % (ref 0–5)
HEMATOCRIT: 25.5 % — AB (ref 36.0–46.0)
HEMOGLOBIN: 8.7 g/dL — AB (ref 12.0–15.0)
LYMPHS ABS: 3 10*3/uL (ref 0.7–4.0)
Lymphocytes Relative: 23 % (ref 12–46)
MCH: 30.5 pg (ref 26.0–34.0)
MCHC: 34.1 g/dL (ref 30.0–36.0)
MCV: 89.5 fL (ref 78.0–100.0)
MONO ABS: 1.2 10*3/uL — AB (ref 0.1–1.0)
Monocytes Relative: 9 % (ref 3–12)
NRBC: 6 /100{WBCs} — AB
Neutro Abs: 8.6 10*3/uL — ABNORMAL HIGH (ref 1.7–7.7)
Neutrophils Relative %: 65 % (ref 43–77)
Platelets: 565 10*3/uL — ABNORMAL HIGH (ref 150–400)
RBC: 2.85 MIL/uL — ABNORMAL LOW (ref 3.87–5.11)
RDW: 17.7 % — ABNORMAL HIGH (ref 11.5–15.5)
WBC: 13.2 10*3/uL — ABNORMAL HIGH (ref 4.0–10.5)

## 2015-01-24 LAB — COMPREHENSIVE METABOLIC PANEL
ALBUMIN: 4.6 g/dL (ref 3.5–5.0)
ALK PHOS: 69 U/L (ref 38–126)
ALT: 16 U/L (ref 14–54)
AST: 27 U/L (ref 15–41)
Anion gap: 7 (ref 5–15)
BILIRUBIN TOTAL: 2.3 mg/dL — AB (ref 0.3–1.2)
BUN: 8 mg/dL (ref 6–20)
CALCIUM: 9.2 mg/dL (ref 8.9–10.3)
CO2: 24 mmol/L (ref 22–32)
CREATININE: 0.7 mg/dL (ref 0.44–1.00)
Chloride: 108 mmol/L (ref 101–111)
GFR calc non Af Amer: >60 mL/min (ref >60)
GLUCOSE: 117 mg/dL — AB (ref 65–99)
Potassium: 3.9 mmol/L (ref 3.5–5.1)
SODIUM: 139 mmol/L (ref 135–145)
Total Protein: 7.9 g/dL (ref 6.5–8.1)

## 2015-01-24 MED ORDER — ONDANSETRON HCL 4 MG/2ML IJ SOLN
4.0000 mg | Freq: Once | INTRAMUSCULAR | Status: AC
  Administered 2015-01-25: 4 mg via INTRAVENOUS
  Filled 2015-01-24: qty 2

## 2015-01-24 MED ORDER — SODIUM CHLORIDE 0.9 % IV SOLN
1000.0000 mL | INTRAVENOUS | Status: DC
  Administered 2015-01-25: 1000 mL via INTRAVENOUS

## 2015-01-24 MED ORDER — SODIUM CHLORIDE 0.9 % IV SOLN
1000.0000 mL | Freq: Once | INTRAVENOUS | Status: AC
  Administered 2015-01-25: 1000 mL via INTRAVENOUS

## 2015-01-24 MED ORDER — DIPHENHYDRAMINE HCL 50 MG/ML IJ SOLN
25.0000 mg | Freq: Once | INTRAMUSCULAR | Status: AC
  Administered 2015-01-25: 25 mg via INTRAVENOUS
  Filled 2015-01-24: qty 1

## 2015-01-24 MED ORDER — HYDROMORPHONE HCL 2 MG/ML IJ SOLN
2.0000 mg | Freq: Once | INTRAMUSCULAR | Status: AC
  Administered 2015-01-25: 2 mg via INTRAVENOUS
  Filled 2015-01-24: qty 1

## 2015-01-24 NOTE — ED Provider Notes (Signed)
CSN: [id number]     Arrival date & time 01/24/15  2001 History  This chart was scribed for Delora Fuel, MD by Julien Nordmann, ED Scribe. This patient was seen in room WA14/WA14 and the patient's care was started at 11:34 PM.      Chief Complaint  Patient presents with  . Sickle Cell Pain Crisis      The history is provided by the patient. No language interpreter was used.   HPI Comments: [name] is a 24 y.o. female with a hx of sickle cell anemia who presents to the Emergency Department complaining of a sudden onset, gradual worsening sickle cell pain crisis in her right arm and bilateral eyes 2 days ago. Pt notes having an aching sensation in her right arm and being light sensitive due to the pain in her eyes. Pt says she is unable to move her right arm and she normally gets pain in her joints when she has a crisis. She notes calling the doctor at the sickle cell clinic and was told to drink 1.5 gallon of water and take 1.5 tablets of roxicodone 15 mg.Pt notes she has not had a pain crisis in about 2 months. She recently gave birth 6 weeks ago and states she does not breastfeed. She denies vomiting, diarrhea, fever, and chills.  Past Medical History  Diagnosis Date  . Miscarriage 03/22/2011    Pt reports 2 miscarriages.  . Depression 01/06/2011  . GERD (gastroesophageal reflux disease) 02/17/2011  . Trichotillomania     h/o  . Blood transfusion     "lots"  . Sickle cell anemia with crisis   . Exertional dyspnea     "sometimes"  . Migraines 11/08/11    "@ least twice/month"  . Chronic back pain     "very severe; have knot in my back; from tight muscle; take RX and exercise for it"  . Mood swings 11/08/11    "I go back and forth; real bad"  . Sickle cell anemia    Past Surgical History  Procedure Laterality Date  . Cholecystectomy  05/2010  . Dilation and curettage of uterus  02/20/11    S/P miscarriage   Family History  Problem Relation Age of Onset  . Diabetes Maternal  Grandmother   . Diabetes Paternal Grandmother   . Hypertension Paternal Grandmother   . Diabetes Maternal Grandfather   . Sickle cell trait Mother   . Sickle cell trait Father    Social History  Substance Use Topics  . Smoking status: Former Smoker -- 0.25 packs/day for 1 years    Types: Cigarettes    Quit date: 03/25/2013  . Smokeless tobacco: Never Used  . Alcohol Use: No     Comment: pt states she quit marijuan in May 2013. Rare ETOH, + cigarettes.  She is enrolled in school   OB History    Gravida Para Term Preterm AB TAB SAB Ectopic Multiple Living   2 1 1  1  1   0 1      Obstetric Comments   Miscarried in October 2012 at about 7 weeks     Review of Systems  Constitutional: Negative for chills.  Gastrointestinal: Negative for diarrhea.  Musculoskeletal: Positive for myalgias.  All other systems reviewed and are negative.     Allergies  Carrot; Carrot oil; and Latex  Home Medications   Prior to Admission medications   Medication Sig Start Date End Date Taking? Authorizing Provider  folic acid (FOLVITE) 1  MG tablet Take 2 tablets (2 mg total) by mouth daily. Patient taking differently: Take 1 mg by mouth daily.  04/24/14   Dorena Dew, FNP  ibuprofen (ADVIL,MOTRIN) 800 MG tablet Take 1 tablet (800 mg total) by mouth every 8 (eight) hours as needed. 12/10/14   Caren Macadam, MD  methadone (DOLOPHINE) 5 MG tablet Take 1 tablet (5 mg total) by mouth at bedtime. 12/30/14   Micheline Chapman, NP  oxyCODONE (ROXICODONE) 15 MG immediate release tablet Take 1 tablet (15 mg total) by mouth every 4 (four) hours as needed for pain. 01/12/15   Micheline Chapman, NP  Prenatal Vit-Fe Fumarate-FA (PRENATAL MULTIVITAMIN) TABS tablet Take 1 tablet by mouth daily at 12 noon.    Historical Provider, MD   Triage vitals: BP 135/63 mmHg  Pulse 98  Temp(Src) 98.4 F (36.9 C) (Oral)  Resp 18  SpO2 100%  LMP 12/30/2014 Physical Exam  Constitutional: She is oriented to  person, place, and time. She appears well-developed and well-nourished. No distress.  HENT:  Head: Normocephalic and atraumatic.  Eyes: EOM are normal. Pupils are equal, round, and reactive to light.  Neck: Normal range of motion. Neck supple. No JVD present.  Cardiovascular: Normal rate, regular rhythm and normal heart sounds.   Pulmonary/Chest: Effort normal and breath sounds normal. She has no wheezes. She has no rales. She exhibits no tenderness.  Abdominal: Soft. Bowel sounds are normal. She exhibits no distension and no mass. There is no tenderness.  Musculoskeletal: Normal range of motion. She exhibits no edema.  Tender diffusely right arm, no swelling  Lymphadenopathy:    She has no cervical adenopathy.  Neurological: She is alert and oriented to person, place, and time. No cranial nerve deficit. She exhibits normal muscle tone. Coordination normal.  Skin: Skin is warm and dry. No rash noted.  Psychiatric: She has a normal mood and affect. Her behavior is normal. Judgment and thought content normal.  Nursing note and vitals reviewed.   ED Course  Procedures  DIAGNOSTIC STUDIES: Oxygen Saturation is 100% on RA, normal by my interpretation.  COORDINATION OF CARE:  11:41 PM Discussed treatment plan which includes Zofran, Benadryl, Dilaudid, and IV fluids with pt at bedside and pt agreed to plan.  Labs Review Results for orders placed or performed during the hospital encounter of 01/24/15  Comprehensive metabolic panel  Result Value Ref Range   Sodium 139 135 - 145 mmol/L   Potassium 3.9 3.5 - 5.1 mmol/L   Chloride 108 101 - 111 mmol/L   CO2 24 22 - 32 mmol/L   Glucose, Bld 117 (H) 65 - 99 mg/dL   BUN 8 6 - 20 mg/dL   Creatinine, Ser 0.70 0.44 - 1.00 mg/dL   Calcium 9.2 8.9 - 10.3 mg/dL   Total Protein 7.9 6.5 - 8.1 g/dL   Albumin 4.6 3.5 - 5.0 g/dL   AST 27 15 - 41 U/L   ALT 16 14 - 54 U/L   Alkaline Phosphatase 69 38 - 126 U/L   Total Bilirubin 2.3 (H) 0.3 - 1.2 mg/dL    GFR calc non Af Amer >60 >60 mL/min   GFR calc Af Amer >60 >60 mL/min   Anion gap 7 5 - 15  CBC with Differential  Result Value Ref Range   WBC 13.2 (H) 4.0 - 10.5 K/uL   RBC 2.85 (L) 3.87 - 5.11 MIL/uL   Hemoglobin 8.7 (L) 12.0 - 15.0 g/dL   HCT 25.5 (L)  36.0 - 46.0 %   MCV 89.5 78.0 - 100.0 fL   MCH 30.5 26.0 - 34.0 pg   MCHC 34.1 30.0 - 36.0 g/dL   RDW 17.7 (H) 11.5 - 15.5 %   Platelets 565 (H) 150 - 400 K/uL   Neutrophils Relative % 65 43 - 77 %   Lymphocytes Relative 23 12 - 46 %   Monocytes Relative 9 3 - 12 %   Eosinophils Relative 2 0 - 5 %   Basophils Relative 1 0 - 1 %   nRBC 6 (H) 0 /100 WBC   Neutro Abs 8.6 (H) 1.7 - 7.7 K/uL   Lymphs Abs 3.0 0.7 - 4.0 K/uL   Monocytes Absolute 1.2 (H) 0.1 - 1.0 K/uL   Eosinophils Absolute 0.3 0.0 - 0.7 K/uL   Basophils Absolute 0.1 0.0 - 0.1 K/uL   RBC Morphology POLYCHROMASIA PRESENT    WBC Morphology VACUOLATED NEUTROPHILS    Smear Review LARGE PLATELETS PRESENT   Reticulocytes  Result Value Ref Range   Retic Ct Pct 22.6 (H) 0.4 - 3.1 %   RBC. 2.86 (L) 3.87 - 5.11 MIL/uL   Retic Count, Manual 646.4 (H) 19.0 - 186.0 K/uL   I have personally reviewed and evaluated these lab results as part of my medical decision-making.  MDM   Final diagnoses:  Vasoocclusive sickle cell crisis    Arm pain consistent with sickle cell disease. She is given IV hydration and intravenous hydromorphone and diphenhydramine. Old records are reviewed and she has several ED visits and hospitalizations for sickle cell crises. She had little relief of pain with initial injection so was repeated oh still little relief of pain. She given a third injection and still did not get adequate relief of pain so decision was made to admit her. Call has been placed to her sickle cell physician.  I personally performed the services described in this documentation, which was scribed in my presence. The recorded information has been reviewed and is accurate.     Delora Fuel, MD 78/93/81 [id number]

## 2015-01-24 NOTE — ED Notes (Signed)
Pt from home c/o sickle cell pain crisis in right arm x 2 days. She called her doctor on the 10 and was told to take 1 1/2 of roxicodone and drink a gallon of water. She reports she is not getting better.

## 2015-01-25 ENCOUNTER — Encounter (HOSPITAL_COMMUNITY): Payer: Self-pay | Admitting: *Deleted

## 2015-01-25 DIAGNOSIS — M549 Dorsalgia, unspecified: Secondary | ICD-10-CM | POA: Diagnosis not present

## 2015-01-25 DIAGNOSIS — D638 Anemia in other chronic diseases classified elsewhere: Secondary | ICD-10-CM

## 2015-01-25 DIAGNOSIS — Z79891 Long term (current) use of opiate analgesic: Secondary | ICD-10-CM | POA: Diagnosis not present

## 2015-01-25 DIAGNOSIS — G894 Chronic pain syndrome: Secondary | ICD-10-CM | POA: Diagnosis present

## 2015-01-25 DIAGNOSIS — Z79899 Other long term (current) drug therapy: Secondary | ICD-10-CM | POA: Diagnosis not present

## 2015-01-25 DIAGNOSIS — D72829 Elevated white blood cell count, unspecified: Secondary | ICD-10-CM | POA: Diagnosis present

## 2015-01-25 DIAGNOSIS — Z833 Family history of diabetes mellitus: Secondary | ICD-10-CM | POA: Diagnosis not present

## 2015-01-25 DIAGNOSIS — Z91040 Latex allergy status: Secondary | ICD-10-CM | POA: Diagnosis not present

## 2015-01-25 DIAGNOSIS — Z91018 Allergy to other foods: Secondary | ICD-10-CM | POA: Diagnosis not present

## 2015-01-25 DIAGNOSIS — F329 Major depressive disorder, single episode, unspecified: Secondary | ICD-10-CM | POA: Diagnosis present

## 2015-01-25 DIAGNOSIS — D5700 Hb-SS disease with crisis, unspecified: Secondary | ICD-10-CM | POA: Diagnosis not present

## 2015-01-25 DIAGNOSIS — Z832 Family history of diseases of the blood and blood-forming organs and certain disorders involving the immune mechanism: Secondary | ICD-10-CM | POA: Diagnosis not present

## 2015-01-25 DIAGNOSIS — Z8249 Family history of ischemic heart disease and other diseases of the circulatory system: Secondary | ICD-10-CM | POA: Diagnosis not present

## 2015-01-25 DIAGNOSIS — Z87891 Personal history of nicotine dependence: Secondary | ICD-10-CM | POA: Diagnosis not present

## 2015-01-25 DIAGNOSIS — K219 Gastro-esophageal reflux disease without esophagitis: Secondary | ICD-10-CM | POA: Diagnosis present

## 2015-01-25 LAB — CBC
HCT: 25.3 % — ABNORMAL LOW (ref 36.0–46.0)
Hemoglobin: 8.4 g/dL — ABNORMAL LOW (ref 12.0–15.0)
MCH: 29.9 pg (ref 26.0–34.0)
MCHC: 33.2 g/dL (ref 30.0–36.0)
MCV: 90 fL (ref 78.0–100.0)
PLATELETS: 566 10*3/uL — AB (ref 150–400)
RBC: 2.81 MIL/uL — AB (ref 3.87–5.11)
RDW: 17.7 % — ABNORMAL HIGH (ref 11.5–15.5)
WBC: 12 10*3/uL — AB (ref 4.0–10.5)

## 2015-01-25 LAB — CREATININE, SERUM
CREATININE: 0.76 mg/dL (ref 0.44–1.00)
GFR calc Af Amer: >60 mL/min (ref >60)

## 2015-01-25 LAB — RETICULOCYTES
RBC.: 2.81 MIL/uL — ABNORMAL LOW (ref 3.87–5.11)
Retic Count, Absolute: 609.8 10*3/uL — ABNORMAL HIGH (ref 19.0–186.0)
Retic Ct Pct: 21.7 % — ABNORMAL HIGH (ref 0.4–3.1)

## 2015-01-25 LAB — LACTATE DEHYDROGENASE: LDH: 293 U/L — ABNORMAL HIGH (ref 98–192)

## 2015-01-25 LAB — MAGNESIUM: Magnesium: 1.9 mg/dL (ref 1.7–2.4)

## 2015-01-25 MED ORDER — HYDROMORPHONE HCL 2 MG/ML IJ SOLN
2.0000 mg | Freq: Once | INTRAMUSCULAR | Status: AC
  Administered 2015-01-25: 2 mg via INTRAVENOUS
  Filled 2015-01-25: qty 1

## 2015-01-25 MED ORDER — DIPHENHYDRAMINE HCL 50 MG/ML IJ SOLN
25.0000 mg | Freq: Once | INTRAMUSCULAR | Status: AC
  Administered 2015-01-25: 25 mg via INTRAVENOUS
  Filled 2015-01-25: qty 1

## 2015-01-25 MED ORDER — POLYETHYLENE GLYCOL 3350 17 G PO PACK
17.0000 g | PACK | Freq: Every day | ORAL | Status: DC | PRN
  Administered 2015-01-26: 17 g via ORAL
  Filled 2015-01-25: qty 1

## 2015-01-25 MED ORDER — KETOROLAC TROMETHAMINE 30 MG/ML IJ SOLN
30.0000 mg | Freq: Four times a day (QID) | INTRAMUSCULAR | Status: DC
  Administered 2015-01-25 – 2015-01-27 (×8): 30 mg via INTRAVENOUS
  Filled 2015-01-25 (×8): qty 1

## 2015-01-25 MED ORDER — ONDANSETRON HCL 4 MG/2ML IJ SOLN: 4.0000 mg | Freq: Four times a day (QID) | INTRAMUSCULAR | Status: DC | PRN

## 2015-01-25 MED ORDER — DEXTROSE-NACL 5-0.45 % IV SOLN
INTRAVENOUS | Status: DC
  Administered 2015-01-25 – 2015-01-26 (×3): via INTRAVENOUS

## 2015-01-25 MED ORDER — SODIUM CHLORIDE 0.9 % IJ SOLN: 9.0000 mL | INTRAMUSCULAR | Status: DC | PRN

## 2015-01-25 MED ORDER — ENOXAPARIN SODIUM 40 MG/0.4ML ~~LOC~~ SOLN
40.0000 mg | SUBCUTANEOUS | Status: DC
  Administered 2015-01-25 – 2015-01-26 (×2): 40 mg via SUBCUTANEOUS
  Filled 2015-01-25 (×2): qty 0.4

## 2015-01-25 MED ORDER — DIPHENHYDRAMINE HCL 12.5 MG/5ML PO ELIX: 12.5000 mg | ORAL_SOLUTION | Freq: Four times a day (QID) | ORAL | Status: DC | PRN

## 2015-01-25 MED ORDER — SENNOSIDES-DOCUSATE SODIUM 8.6-50 MG PO TABS
1.0000 | ORAL_TABLET | Freq: Two times a day (BID) | ORAL | Status: DC
  Administered 2015-01-25 – 2015-01-27 (×4): 1 via ORAL
  Filled 2015-01-25 (×5): qty 1

## 2015-01-25 MED ORDER — HYDROMORPHONE 2 MG/ML HIGH CONCENTRATION IV PCA SOLN
INTRAVENOUS | Status: DC
  Administered 2015-01-25: 14:00:00 via INTRAVENOUS
  Administered 2015-01-25: 5 mg via INTRAVENOUS
  Administered 2015-01-25: 2 mg via INTRAVENOUS
  Administered 2015-01-26: 6 mg via INTRAVENOUS
  Administered 2015-01-26: 5 mg via INTRAVENOUS
  Administered 2015-01-26: 3.5 mg via INTRAVENOUS
  Administered 2015-01-26: 4.5 mg via INTRAVENOUS
  Administered 2015-01-26: 2.5 mg via INTRAVENOUS
  Administered 2015-01-26: 4 mg via INTRAVENOUS
  Administered 2015-01-27: 2 mg via INTRAVENOUS
  Administered 2015-01-27: 4.5 mg via INTRAVENOUS
  Administered 2015-01-27: 0.5 mg via INTRAVENOUS
  Filled 2015-01-25 (×2): qty 25

## 2015-01-25 MED ORDER — NALOXONE HCL 0.4 MG/ML IJ SOLN: 0.4000 mg | INTRAMUSCULAR | Status: DC | PRN

## 2015-01-25 MED ORDER — METHADONE HCL 5 MG PO TABS
5.0000 mg | ORAL_TABLET | Freq: Every day | ORAL | Status: DC
  Administered 2015-01-25 – 2015-01-26 (×2): 5 mg via ORAL
  Filled 2015-01-25 (×2): qty 1

## 2015-01-25 MED ORDER — SODIUM CHLORIDE 0.9 % IV SOLN
12.5000 mg | Freq: Four times a day (QID) | INTRAVENOUS | Status: DC | PRN
  Administered 2015-01-25: 12.5 mg via INTRAVENOUS
  Filled 2015-01-25 (×2): qty 0.25

## 2015-01-25 NOTE — ED Notes (Signed)
Receiving nurse not available to take report after 2 calls. Charge nurse asked to receive report since the primary nurse is in patient care at the time. She declined. Author will wait a few more minutes for nurse to call back. ED Charge nurse aware. Patient will be taken by primary nurse to give bedside report if nurse nor Charge nurse can receive report.

## 2015-01-25 NOTE — H&P (Signed)
[name] is an 24 y.o. female.   Chief Complaint: Pain all over for 2 days HPI: Patient is a 24 year old female well-known to our service who recently gave birth to a baby boy. She is at home with the child when she started having pain in her back legs rated as 10 out of 10 initially. She took her medicine at home including oxycodone and methadone. She increased the methadone from 1-1-1/2 at the time but still pain was persistent. She could not get anyone to watch her son for 2 days so she delayed coming to the hospital on 2 last night. Pain is similar to her sickle cell pain. Aggravated by activities and movement. No radiation. She denies shortness of breath cough nausea or vomiting.  Past Medical History  Diagnosis Date  . Miscarriage 03/22/2011    Pt reports 2 miscarriages.  . Depression 01/06/2011  . GERD (gastroesophageal reflux disease) 02/17/2011  . Trichotillomania     h/o  . Blood transfusion     "lots"  . Sickle cell anemia with crisis   . Exertional dyspnea     "sometimes"  . Migraines 11/08/11    "@ least twice/month"  . Chronic back pain     "very severe; have knot in my back; from tight muscle; take RX and exercise for it"  . Mood swings 11/08/11    "I go back and forth; real bad"  . Sickle cell anemia     Past Surgical History  Procedure Laterality Date  . Cholecystectomy  05/2010  . Dilation and curettage of uterus  02/20/11    S/P miscarriage    Family History  Problem Relation Age of Onset  . Diabetes Maternal Grandmother   . Diabetes Paternal Grandmother   . Hypertension Paternal Grandmother   . Diabetes Maternal Grandfather   . Sickle cell trait Mother   . Sickle cell trait Father    Social History:  reports that she quit smoking about 22 months ago. Her smoking use included Cigarettes. She has a .25 pack-year smoking history. She has never used smokeless tobacco. She reports that she does not drink alcohol or use illicit drugs.  Allergies:   Allergies  Allergen Reactions  . Carrot [Daucus Carota] Hives, Swelling and Rash    Dietary:  Please do not send any form of carrot to patient  . Carrot Oil Hives and Swelling  . Latex Rash     (Not in a hospital admission)  Results for orders placed or performed during the hospital encounter of 01/24/15 (from the past 48 hour(s))  Comprehensive metabolic panel     Status: Abnormal   Collection Time: 01/24/15  9:23 PM  Result Value Ref Range   Sodium 139 135 - 145 mmol/L   Potassium 3.9 3.5 - 5.1 mmol/L   Chloride 108 101 - 111 mmol/L   CO2 24 22 - 32 mmol/L   Glucose, Bld 117 (H) 65 - 99 mg/dL   BUN 8 6 - 20 mg/dL   Creatinine, Ser 0.70 0.44 - 1.00 mg/dL   Calcium 9.2 8.9 - 10.3 mg/dL   Total Protein 7.9 6.5 - 8.1 g/dL   Albumin 4.6 3.5 - 5.0 g/dL   AST 27 15 - 41 U/L   ALT 16 14 - 54 U/L   Alkaline Phosphatase 69 38 - 126 U/L   Total Bilirubin 2.3 (H) 0.3 - 1.2 mg/dL   GFR calc non Af Amer >60 >60 mL/min   GFR calc Af Amer >60 >  60 mL/min    Comment: (NOTE) The eGFR has been calculated using the CKD EPI equation. This calculation has not been validated in all clinical situations. eGFR's persistently <60 mL/min signify possible Chronic Kidney Disease.    Anion gap 7 5 - 15  CBC with Differential     Status: Abnormal   Collection Time: 01/24/15  9:23 PM  Result Value Ref Range   WBC 13.2 (H) 4.0 - 10.5 K/uL   RBC 2.85 (L) 3.87 - 5.11 MIL/uL   Hemoglobin 8.7 (L) 12.0 - 15.0 g/dL   HCT 25.5 (L) 36.0 - 46.0 %   MCV 89.5 78.0 - 100.0 fL   MCH 30.5 26.0 - 34.0 pg   MCHC 34.1 30.0 - 36.0 g/dL   RDW 17.7 (H) 11.5 - 15.5 %   Platelets 565 (H) 150 - 400 K/uL   Neutrophils Relative % 65 43 - 77 %   Lymphocytes Relative 23 12 - 46 %   Monocytes Relative 9 3 - 12 %   Eosinophils Relative 2 0 - 5 %   Basophils Relative 1 0 - 1 %   nRBC 6 (H) 0 /100 WBC   Neutro Abs 8.6 (H) 1.7 - 7.7 K/uL   Lymphs Abs 3.0 0.7 - 4.0 K/uL   Monocytes Absolute 1.2 (H) 0.1 - 1.0 K/uL    Eosinophils Absolute 0.3 0.0 - 0.7 K/uL   Basophils Absolute 0.1 0.0 - 0.1 K/uL   RBC Morphology POLYCHROMASIA PRESENT     Comment: TARGET CELLS SICKLE CELLS RARE NRBCs    WBC Morphology VACUOLATED NEUTROPHILS    Smear Review LARGE PLATELETS PRESENT   Reticulocytes     Status: Abnormal   Collection Time: 01/24/15  9:23 PM  Result Value Ref Range   Retic Ct Pct 22.6 (H) 0.4 - 3.1 %   RBC. 2.86 (L) 3.87 - 5.11 MIL/uL   Retic Count, Manual 646.4 (H) 19.0 - 186.0 K/uL   No results found.  Review of Systems  Constitutional: Positive for malaise/fatigue.  Eyes: Negative.   Respiratory: Negative.   Cardiovascular: Negative.   Gastrointestinal: Negative.   Genitourinary: Negative.   Musculoskeletal: Positive for myalgias, back pain, joint pain and neck pain.  Skin: Negative.   Neurological: Positive for weakness.  Endo/Heme/Allergies: Negative.   Psychiatric/Behavioral: Negative.     Blood pressure 102/66, pulse 78, temperature 98.4 F (36.9 C), temperature source Oral, resp. rate 16, height 5' 2"  (1.575 m), weight 74.844 kg (165 lb), last menstrual period 12/30/2014, SpO2 99 %, unknown if currently breastfeeding. Physical Exam  Constitutional: She is oriented to person, place, and time. She appears well-developed and well-nourished.  HENT:  Head: Normocephalic and atraumatic.  Right Ear: External ear normal.  Left Ear: External ear normal.  Eyes: Conjunctivae and EOM are normal. Pupils are equal, round, and reactive to light.  Neck: Normal range of motion. Neck supple.  Cardiovascular: Normal rate, regular rhythm and normal heart sounds.   Respiratory: Effort normal and breath sounds normal.  GI: Soft. Bowel sounds are normal.  Musculoskeletal: Normal range of motion.  Neurological: She is alert and oriented to person, place, and time.  Skin: Skin is warm and dry.  Psychiatric: She has a normal mood and affect.     Assessment/Plan A 24 year old female with known sickle  cell disease who just had a baby being admitted with sickle cell painful crisis.  #1 sickle cell painful crisis: Patient will be admitted to the hospital. I will start her on Dilaudid  PCA since she is opiate tolerant, I will place her on high-concentration PCA with Toradol and IV fluids. She is on methadone at home which I will continue with. I will check EKG now. Continue with supportive care.  #2 sickle cell anemia: Patient's hemoglobin is close to her baseline. We will follow closely and if transfusion is indicated we will transfuse.  #3 recent childbirth: Patient is not lactating her baby due to pain medications. This will therefore not be a factor in her treatment at this point.  #4 chronic pain syndrome: Again patient is currently on methadone. We will check EKG to evaluate QRS interval. She'll resume her chronic pain management I have primary care physician at discharge  [facility] 01/25/2015, 12:32 PM

## 2015-01-26 LAB — COMPREHENSIVE METABOLIC PANEL
ALBUMIN: 3.9 g/dL (ref 3.5–5.0)
ALT: 14 U/L (ref 14–54)
AST: 23 U/L (ref 15–41)
Alkaline Phosphatase: 76 U/L (ref 38–126)
Anion gap: 5 (ref 5–15)
BUN: 12 mg/dL (ref 6–20)
CHLORIDE: 107 mmol/L (ref 101–111)
CO2: 25 mmol/L (ref 22–32)
CREATININE: 0.87 mg/dL (ref 0.44–1.00)
Calcium: 8.6 mg/dL — ABNORMAL LOW (ref 8.9–10.3)
GFR calc Af Amer: >60 mL/min (ref >60)
GLUCOSE: 101 mg/dL — AB (ref 65–99)
POTASSIUM: 4.1 mmol/L (ref 3.5–5.1)
Sodium: 137 mmol/L (ref 135–145)
Total Bilirubin: 2.2 mg/dL — ABNORMAL HIGH (ref 0.3–1.2)
Total Protein: 6.7 g/dL (ref 6.5–8.1)

## 2015-01-26 LAB — CBC WITH DIFFERENTIAL/PLATELET
BASOS PCT: 1 % (ref 0–1)
Basophils Absolute: 0.1 10*3/uL (ref 0.0–0.1)
EOS PCT: 5 % (ref 0–5)
Eosinophils Absolute: 0.6 10*3/uL (ref 0.0–0.7)
HEMATOCRIT: 22.6 % — AB (ref 36.0–46.0)
Hemoglobin: 7.8 g/dL — ABNORMAL LOW (ref 12.0–15.0)
LYMPHS ABS: 3.9 10*3/uL (ref 0.7–4.0)
Lymphocytes Relative: 32 % (ref 12–46)
MCH: 31.1 pg (ref 26.0–34.0)
MCHC: 34.5 g/dL (ref 30.0–36.0)
MCV: 90 fL (ref 78.0–100.0)
MONO ABS: 1.3 10*3/uL — AB (ref 0.1–1.0)
MONOS PCT: 11 % (ref 3–12)
NEUTROS ABS: 6.2 10*3/uL (ref 1.7–7.7)
Neutrophils Relative %: 51 % (ref 43–77)
PLATELETS: 506 10*3/uL — AB (ref 150–400)
RBC: 2.51 MIL/uL — AB (ref 3.87–5.11)
RDW: 17.6 % — AB (ref 11.5–15.5)
WBC: 12.1 10*3/uL — AB (ref 4.0–10.5)

## 2015-01-26 MED ORDER — SODIUM CHLORIDE 0.9 % IV SOLN
25.0000 mg | INTRAVENOUS | Status: DC | PRN
  Filled 2015-01-26: qty 0.5

## 2015-01-26 MED ORDER — INFLUENZA VAC SPLIT QUAD 0.5 ML IM SUSY
0.5000 mL | PREFILLED_SYRINGE | INTRAMUSCULAR | Status: DC
  Filled 2015-01-26 (×2): qty 0.5

## 2015-01-26 MED ORDER — HYDROXYUREA 500 MG PO CAPS
500.0000 mg | ORAL_CAPSULE | Freq: Every day | ORAL | Status: DC
  Administered 2015-01-26 – 2015-01-27 (×2): 500 mg via ORAL
  Filled 2015-01-26 (×2): qty 1

## 2015-01-26 MED ORDER — DIPHENHYDRAMINE HCL 25 MG PO CAPS
25.0000 mg | ORAL_CAPSULE | Freq: Four times a day (QID) | ORAL | Status: DC | PRN
  Administered 2015-01-26: 25 mg via ORAL
  Filled 2015-01-26: qty 1

## 2015-01-26 NOTE — Progress Notes (Signed)
SICKLE CELL SERVICE PROGRESS NOTE  [name] YBO:[id number] DOB: August 07, 1990 DOA: 01/24/2015 PCP: Angelica Chessman, MD  Assessment/Plan: Principal Problem:   Sickle cell pain crisis Active Problems:   Mood swings   Anemia of chronic disease  1. Hb SS with crisis: Pt states that she has pain 6/10 localized to her left wrist and elbow. Pain in legs improved. She has used         In the last 24 hours. Will schedule Oxycodone 15 mg every 4 hours and continue PCA for PRN use. Also continue Toradol. Pt eating and drinking well so continue IVF at [facility] rate. Anticipate discharge home tomorrow. Discussed teratogenic effects of Hydrea if breastfeeding. [name] has decided against further breastfeeding so will resume Hydrea 500 mg daily.   2. Leukocytosis: Pt has a mild leukocytosis without any evidence of infection. Likely related to crisis. Will continue to monitor. 3. Anemia of chronic disease: Pt has a tolerated anemia and is currently at baseline. 4. Chronic pain: Continue Methadone 5 mg at bedtime.  5. Sickle Cell Disease: Pt has an appointment to see Hematology.  Code Status: Full Code Family Communication: N/A Disposition Plan: Not yet ready for discharge  [place].  Pager [phone]. If 7PM-7AM, please contact night-coverage.  01/26/2015, 12:27 PM  LOS: 1 day    Consultants:  None  Procedures:  None  Antibiotics:  None  HPI/Subjective: Pt states that she has pain 6/10 localized to her left wrist and elbow. Pain in legs improved.  Objective: Filed Vitals:   01/26/15 0400 01/26/15 0627 01/26/15 0800 01/26/15 1200  BP:  102/58    Pulse:  90    Temp:  98 F (36.7 C)    TempSrc:  Oral    Resp: 16 16 16 12   Height:      Weight:      SpO2: 100% 100% 100% 99%   Weight change:   Intake/Output Summary (Last 24 hours) at 01/26/15 1227 Last data filed at 01/25/15 1829  Gross per 24 hour  Intake    240 ml  Output      0 ml  Net    240 ml    General:  Alert, awake, oriented x3, in no acute distress.  HEENT: [place]/AT PEERL, EOMI, anicteric Neck: Trachea midline,  no masses, no thyromegal,y no JVD, no carotid bruit OROPHARYNX:  Moist, No exudate/ erythema/lesions.  Heart: Regular rate and rhythm, without murmurs, rubs, gallops.  Lungs: Clear to auscultation, no wheezing or rhonchi noted.  Abdomen: Soft, nontender, nondistended, positive bowel sounds, no masses no hepatosplenomegaly noted..  Neuro: No focal neurological deficits noted cranial nerves II through XII grossly intact. Strength at functional baseline in bilateral upper and lower extremities. Musculoskeletal: No warm swelling or erythema around joints, no spinal tenderness noted. Psychiatric: Patient alert and oriented x3, good insight and cognition, good recent to remote recall. Lymph node survey: No cervical axillary or inguinal lymphadenopathy noted.   Data Reviewed: Basic Metabolic Panel:  Recent Labs Lab 01/24/15 2123 01/25/15 1519 01/26/15 0420  NA 139  --  137  K 3.9  --  4.1  CL 108  --  107  CO2 24  --  25  GLUCOSE 117*  --  101*  BUN 8  --  12  CREATININE 0.70 0.76 0.87  CALCIUM 9.2  --  8.6*  MG  --  1.9  --    Liver Function Tests:  Recent Labs Lab 01/24/15 2123 01/26/15 0420  AST 27 23  ALT 16 14  ALKPHOS 69 76  BILITOT 2.3* 2.2*  PROT 7.9 6.7  ALBUMIN 4.6 3.9   No results for input(s): LIPASE, AMYLASE in the last 168 hours. No results for input(s): AMMONIA in the last 168 hours. CBC:  Recent Labs Lab 01/24/15 2123 01/25/15 1519 01/26/15 0420  WBC 13.2* 12.0* 12.1*  NEUTROABS 8.6*  --  6.2  HGB 8.7* 8.4* 7.8*  HCT 25.5* 25.3* 22.6*  MCV 89.5 90.0 90.0  PLT 565* 566* 506*   Cardiac Enzymes: No results for input(s): CKTOTAL, CKMB, CKMBINDEX, TROPONINI in the last 168 hours. BNP (last 3 results) No results for input(s): BNP in the last 8760 hours.  ProBNP (last 3 results) No results for input(s): PROBNP in the last 8760  hours.  CBG: No results for input(s): GLUCAP in the last 168 hours.  No results found for this or any previous visit (from the past 240 hour(s)).   Studies: No results found.  Scheduled Meds: . enoxaparin (LOVENOX) injection  40 mg Subcutaneous Q24H  . HYDROmorphone PCA 2 mg/mL   Intravenous 6 times per day  . hydroxyurea  500 mg Oral Daily  . [START ON 01/27/2015] Influenza vac split quadrivalent PF  0.5 mL Intramuscular Tomorrow-1000  . ketorolac  30 mg Intravenous 4 times per day  . methadone  5 mg Oral QHS  . senna-docusate  1 tablet Oral BID   Continuous Infusions: . sodium chloride 1,000 mL (01/25/15 0358)  . dextrose 5 % and 0.45% NaCl 125 mL/hr at 01/26/15 1209    Time spent 40 minutes.

## 2015-01-27 MED ORDER — METHADONE HCL 5 MG PO TABS: 5.0000 mg | ORAL_TABLET | Freq: Every day | ORAL | Status: DC

## 2015-01-27 MED ORDER — HYDROXYUREA 500 MG PO CAPS: 500.0000 mg | ORAL_CAPSULE | Freq: Every day | ORAL | Status: DC

## 2015-01-27 MED ORDER — OXYCODONE HCL 15 MG PO TABS
15.0000 mg | ORAL_TABLET | ORAL | Status: DC | PRN
Start: 2015-01-27 — End: 2015-02-10

## 2015-01-27 NOTE — Care Management Important Message (Signed)
Important Message  Patient Details  Name: [name] MRN: [id number] Date of Birth: [birth year]   Medicare Important Message Given:  Yes-third notification given    Camillo Flaming 01/27/2015, 12:41 [place] Message  Patient Details  Name: [name] MRN: [id number] Date of Birth: July 01, 1990   Medicare Important Message Given:  Yes-third notification given    Camillo Flaming 01/27/2015, 12:41 PM

## 2015-01-27 NOTE — Progress Notes (Signed)
Ambulating 02 sats ranged 96-99% on Rm air. Hr ranged 93-112 with activity. Pt denies ant complaints at this time.

## 2015-01-27 NOTE — Telephone Encounter (Signed)
Reviewed patient's chart. Reviewed [place] Substance Reporting system prior to reorder  Meds ordered this encounter  Medications  . oxyCODONE (ROXICODONE) 15 MG immediate release tablet    Sig: Take 1 tablet (15 mg total) by mouth every 4 (four) hours as needed for pain.    Dispense:  90 tablet    Refill:  0    Not to be filled prior to 15 days from last refill.    Order Specific Question:  Supervising Provider    Answer:  Tresa Garter [id number]  . methadone (DOLOPHINE) 5 MG tablet    Sig: Take 1 tablet (5 mg total) by mouth at bedtime.    Dispense:  30 tablet    Refill:  0    Not to be filled prior to 01/30/2015    Order Specific Question:  Supervising Provider    Answer:  Tresa Garter [[id number]]    Dorena Dew, FNP

## 2015-01-27 NOTE — Discharge Summary (Signed)
[name] MRN: [id number] DOB/AGE: 08-09-1990 24 y.o.  Admit date: 01/24/2015 Discharge date: 01/27/2015  Primary Care Physician:  Angelica Chessman, MD   Discharge Diagnoses:   Patient Active Problem List   Diagnosis Date Noted  . Mood swings 11/08/2011    Priority: High  . Depression 01/06/2011    Priority: High  . Sickle cell disease 01/08/2009    Priority: High  . Anemia of chronic disease 10/05/2012    Priority: Medium  . 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 Labor abnormal 12/05/2014  . Sickle cell pain crisis 11/26/2014  . Hypokalemia 10/29/2014  . Line sepsis 10/27/2014  . Sickle cell crisis 10/23/2014  . 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 Supervision of high-risk pregnancy 06/11/2014  . Chronic pain 02/26/2013  . Migraines 11/08/2011  . Overweight(278.02) 05/24/2011  . GERD (gastroesophageal reflux disease) 02/17/2011  . Back pain 09/17/2010  . Trichotillomania 01/08/2009    DISCHARGE MEDICATION:   Medication List    TAKE these medications        folic acid 1 MG tablet  Commonly known as:  FOLVITE  Take 2 tablets (2 mg total) by mouth daily.     hydroxyurea 500 MG capsule  Commonly known as:  HYDREA  Take 1 capsule (500 mg total) by mouth daily. May take with food to minimize GI side effects.     ibuprofen 800 MG tablet  Commonly known as:  ADVIL,MOTRIN  Take 1 tablet (800 mg total) by mouth every 8 (eight) hours as needed.     methadone 5 MG tablet  Commonly known as:  DOLOPHINE  Take 1 tablet (5 mg total) by mouth at bedtime.     oxyCODONE 15 MG immediate release tablet  Commonly known as:  ROXICODONE  Take 1 tablet (15 mg total) by mouth every 4 (four) hours as needed for pain.          Consults: Treatment Team:  Sickle Cell Rounding, MD   SIGNIFICANT DIAGNOSTIC STUDIES:  No results found.    No results found for this or any previous visit (from the past 240 hour(s)).  BRIEF  ADMITTING H & P: Patient is a 24 year old female well-known to our service who recently gave birth to a baby boy. She is at home with the child when she started having pain in her back legs rated as 10 out of 10 initially. She took her medicine at home including oxycodone and methadone. She increased the methadone from 1-1-1/2 at the time but still pain was persistent. She could not get anyone to watch her son for 2 days so she delayed coming to the hospital on 2 last     Hospital Course:  Present on Admission:  . Sickle cell pain crisis: Pt was treated with IV Dilaudid via PCA, Toradol, Methadone and IVF. As pain improved she was transitioned to oral analgesics. She had been stable on Hydrea 1500 mg prior to her pregnancy and was restarted on Hydrea 500 mg daily. He Hb remained stable and she will follow up at her appointment at [facility] which per patient is in the next 2 weeks. . Anemia of chronic disease: Pt has a tolerated anemia with a baseline of 8 g/dl. At the time of discharge, her Hb was 7.8 g/dl which is essentially at baseline.   Disposition and Follow-up:  Pt discharged home in good condition. She is to follow up with [facility] and also with her  scheduled appointment at [facility].     Discharge Instructions    Activity as tolerated - No restrictions    Complete by:  As directed      Diet general    Complete by:  As directed            DISCHARGE EXAM: General: Alert, awake, oriented x3, in no distress. Pt anxious to get home to her newborn. Vital Signs: BP  117/65, HR 100, T 98.8 F (37.1 C), temperature source Oral, RR 18, height 5\' 2"  (1.575 m), weight 165 lb (74.844 kg), last menstrual period 12/30/2014, SpO2 92 %, unknown if currently breastfeeding. HEENT: [place]/AT PEERL, EOMI, anicteric OROPHARYNX: Moist, No exudate/ erythema/lesions.  Heart: Regular rate and rhythm, without murmurs, rubs, gallops or S3.  Pulmonary/Chest: Normal effort.  Breath sounds normal. No. Apnea. Clear to auscultation,no stridor,  no wheezing and no rhonchi noted. No respiratory distress and no tenderness noted. Abdomen: Soft, nontender, nondistended, normal bowel sounds, no masses no hepatosplenomegaly noted. No fluid wave and no ascites. There is no guarding or rebound. Neuro: Alert and oriented to person, place and time. Normal motor skills, Displays no atrophy or tremors and exhibits normal muscle tone.  No focal neurological deficits noted cranial nerves II through XII grossly intact. No sensory deficit noted. Strength at baseline in bilateral upper and lower extremities. Gait normal. Musculoskeletal: No warm swelling or erythema around joints, no spinal tenderness noted. Psychiatric: Patient alert and oriented x3, good insight and cognition, good recent to remote recall. Mood, memory, affect and judgement normal Lymph node survey: No cervical axillary or inguinal lymphadenopathy noted. Skin: Skin is warm and dry. No bruising, no ecchymosis and no rash noted. Pt is not diaphoretic. No erythema. No pallor      Recent Labs  01/24/15 2123 01/25/15 1519 01/26/15 0420  NA 139  --  137  K 3.9  --  4.1  CL 108  --  107  CO2 24  --  25  GLUCOSE 117*  --  101*  BUN 8  --  12  CREATININE 0.70 0.76 0.87  CALCIUM 9.2  --  8.6*  MG  --  1.9  --     Recent Labs  01/24/15 2123 01/26/15 0420  AST 27 23  ALT 16 14  ALKPHOS 69 76  BILITOT 2.3* 2.2*  PROT 7.9 6.7  ALBUMIN 4.6 3.9   No results for input(s): LIPASE, AMYLASE in the last 72 hours.  Recent Labs  01/24/15 2123 01/25/15 1519 01/26/15 0420  WBC 13.2* 12.0* 12.1*  NEUTROABS 8.6*  --  6.2  HGB 8.7* 8.4* 7.8*  HCT 25.5* 25.3* 22.6*  MCV 89.5 90.0 90.0  PLT 565* 566* 506*     Total time spent including face to face and decision making was greater than 30 minutes  Signed: Shimshon Narula A. 01/27/2015, 11:30 AM

## 2015-01-27 NOTE — Progress Notes (Signed)
73ml of high dose pca Dilaudid wasted for pt d/c home 2mg /ml syringe

## 2015-01-27 NOTE — Telephone Encounter (Signed)
Reviewed [place] Substance Reporting system prior to reorder, no inconsistencies noted.   Meds ordered this encounter  Medications  . oxyCODONE (ROXICODONE) 15 MG immediate release tablet    Sig: Take 1 tablet (15 mg total) by mouth every 4 (four) hours as needed for pain.    Dispense:  90 tablet    Refill:  0    Not to be filled prior to 15 days from last refill.    Order Specific Question:  Supervising Provider    Answer:  Tresa Garter [id number]  . methadone (DOLOPHINE) 5 MG tablet    Sig: Take 1 tablet (5 mg total) by mouth at bedtime.    Dispense:  30 tablet    Refill:  0    Not to be filled prior to 01/30/2015    Order Specific Question:  Supervising Provider    Answer:  Tresa Garter [[id number]]     Dorena Dew, FNP

## 2015-01-27 NOTE — Telephone Encounter (Signed)
Please see refill request below and advise in [name]'s absence?

## 2015-01-27 NOTE — Progress Notes (Signed)
Wasted 2 ml of High Concentration PCA Dilaudid in sink by Tor Netters, RN as witnessed by Petra Kuba, RN.

## 2015-02-05 ENCOUNTER — Other Ambulatory Visit: Payer: Self-pay | Admitting: Family Medicine

## 2015-02-05 ENCOUNTER — Other Ambulatory Visit: Payer: Self-pay | Admitting: Internal Medicine

## 2015-02-05 DIAGNOSIS — G8929 Other chronic pain: Secondary | ICD-10-CM

## 2015-02-05 NOTE — Telephone Encounter (Signed)
Prescription refill request for oxycodone 15 mg / LOV 12/16/2014 / Thanks

## 2015-02-06 ENCOUNTER — Other Ambulatory Visit: Payer: Self-pay | Admitting: Family Medicine

## 2015-02-09 ENCOUNTER — Telehealth: Payer: Self-pay | Admitting: Family Medicine

## 2015-02-09 ENCOUNTER — Other Ambulatory Visit: Payer: Self-pay | Admitting: Family Medicine

## 2015-02-09 ENCOUNTER — Telehealth: Payer: Self-pay | Admitting: Internal Medicine

## 2015-02-09 DIAGNOSIS — G8929 Other chronic pain: Secondary | ICD-10-CM

## 2015-02-09 NOTE — Telephone Encounter (Signed)
Left message for patient to call to schedule appointment

## 2015-02-09 NOTE — Telephone Encounter (Signed)
She needs an appointment with [place] on Thursday.

## 2015-02-10 ENCOUNTER — Other Ambulatory Visit: Payer: Medicare Other

## 2015-02-10 DIAGNOSIS — G8929 Other chronic pain: Secondary | ICD-10-CM | POA: Diagnosis not present

## 2015-02-10 MED ORDER — OXYCODONE HCL 15 MG PO TABS
15.0000 mg | ORAL_TABLET | ORAL | Status: DC | PRN
Start: 2015-02-10 — End: 2015-02-23

## 2015-02-10 NOTE — Telephone Encounter (Signed)
Refill request for oxycodone 15mg. Please advise. Thanks!  

## 2015-02-10 NOTE — Telephone Encounter (Signed)
Reviewed [place] Substance Reporting system prior to reorder, no inconsistencies noted. Patient has a follow-up appointment scheduled for 02/20/2015.   Meds ordered this encounter  Medications  . oxyCODONE (ROXICODONE) 15 MG immediate release tablet    Sig: Take 1 tablet (15 mg total) by mouth every 4 (four) hours as needed for pain.    Dispense:  90 tablet    Refill:  0    Order Specific Question:  Supervising Provider    Answer:  Tresa Garter [[id number]]    Dorena Dew, FNP

## 2015-02-12 ENCOUNTER — Ambulatory Visit: Payer: Medicare Other | Admitting: Family Medicine

## 2015-02-15 LAB — CANNABANOIDS (GC/LC/MS), URINE: THC-COOH UR CONFIRM: 32 ng/mL — AB (ref <5)

## 2015-02-17 LAB — PRESCRIPTION MONITORING PROFILE (SOLSTAS)
Amphetamine/Meth: NEGATIVE ng/mL
Barbiturate Screen, Urine: NEGATIVE ng/mL
Benzodiazepine Screen, Urine: NEGATIVE ng/mL
Buprenorphine, Urine: NEGATIVE ng/mL
CARISOPRODOL, URINE: NEGATIVE ng/mL
COCAINE METABOLITES: NEGATIVE ng/mL
Creatinine, Urine: 55.12 mg/dL (ref >20.0)
ECSTASY: NEGATIVE ng/mL
FENTANYL URINE: NEGATIVE ng/mL
MEPERIDINE UR: NEGATIVE ng/mL
METHADONE SCREEN, URINE: NEGATIVE ng/mL
NITRITES URINE, INITIAL: NEGATIVE ug/mL
OXYCODONE SCRN UR: NEGATIVE ng/mL
Opiate Screen, Urine: NEGATIVE ng/mL
PH URINE, INITIAL: 7 pH (ref 4.5–8.9)
Propoxyphene: NEGATIVE ng/mL
TAPENTADOLUR: NEGATIVE ng/mL
Tramadol Scrn, Ur: NEGATIVE ng/mL
Zolpidem, Urine: NEGATIVE ng/mL

## 2015-02-19 ENCOUNTER — Encounter: Payer: Self-pay | Admitting: Family Medicine

## 2015-02-19 ENCOUNTER — Ambulatory Visit (INDEPENDENT_AMBULATORY_CARE_PROVIDER_SITE_OTHER): Payer: Medicare Other | Admitting: Family Medicine

## 2015-02-19 ENCOUNTER — Other Ambulatory Visit: Payer: Self-pay | Admitting: Family Medicine

## 2015-02-19 VITALS — BP 115/53 | HR 88 | Temp 98.2°F | Resp 16 | Ht 61.0 in | Wt 176.0 lb

## 2015-02-19 DIAGNOSIS — Z3490 Encounter for supervision of normal pregnancy, unspecified, unspecified trimester: Secondary | ICD-10-CM

## 2015-02-19 DIAGNOSIS — D571 Sickle-cell disease without crisis: Secondary | ICD-10-CM

## 2015-02-19 MED ORDER — HYDROXYUREA 500 MG PO CAPS: 500.0000 mg | ORAL_CAPSULE | Freq: Every day | ORAL | Status: DC

## 2015-02-19 NOTE — Progress Notes (Signed)
Patient ID: [name], female   DOB: [birth year], 24 y.o.   MRN: [id number]    [name], is a 24 y.o. female  UXL:[id number]  ZDG:[id number]  DOB - [birth year]  CC:  Chief Complaint  Patient presents with  . Follow-up    SCD       HPI: [name] is a 24 y.o. female here for  sickle cell follow-up. She reports no significant change in her sickle cell status. She denies significant hip pain, sores and ulcerations of lower legs. She is not due for refill of her pain medications for another week. She request a referral to a hematologist, a pain clinic in [place]. She also needs a refill on her hydrea.   Allergies  Allergen Reactions  . Carrot [Daucus Carota] Hives, Swelling and Rash    Dietary:  Please do not send any form of carrot to patient  . Carrot Oil Hives and Swelling  . Latex Rash   Past Medical History  Diagnosis Date  . Miscarriage 03/22/2011    Pt reports 2 miscarriages.  . Depression 01/06/2011  . GERD (gastroesophageal reflux disease) 02/17/2011  . Trichotillomania     h/o  . Blood transfusion     "lots"  . Sickle cell anemia with crisis   . Exertional dyspnea     "sometimes"  . Migraines 11/08/11    "@ least twice/month"  . Chronic back pain     "very severe; have knot in my back; from tight muscle; take RX and exercise for it"  . Mood swings 11/08/11    "I go back and forth; real bad"  . Sickle cell anemia    Current Outpatient Prescriptions on File Prior to Visit  Medication Sig Dispense Refill  . ibuprofen (ADVIL,MOTRIN) 800 MG tablet Take 1 tablet (800 mg total) by mouth every 8 (eight) hours as needed. 90 tablet 3  . methadone (DOLOPHINE) 5 MG tablet Take 1 tablet (5 mg total) by mouth at bedtime. 30 tablet 0  . oxyCODONE (ROXICODONE) 15 MG immediate release tablet Take 1 tablet (15 mg total) by mouth every 4 (four) hours as needed for pain. 90 tablet 0  . folic acid (FOLVITE) 1 MG tablet Take 2 tablets (2 mg total) by mouth daily.  (Patient not taking: Reported on 01/24/2015) 60 tablet 2  . [DISCONTINUED] medroxyPROGESTERone (DEPO-PROVERA) 150 MG/ML injection Inject 150 mg into the muscle every 3 (three) months. Last dose 04/20/11     No current facility-administered medications on file prior to visit.   Family History  Problem Relation Age of Onset  . Diabetes Maternal Grandmother   . Diabetes Paternal Grandmother   . Hypertension Paternal Grandmother   . Diabetes Maternal Grandfather   . Sickle cell trait Mother   . Sickle cell trait Father    Social History   Social History  . Marital Status: Single    Spouse Name: N/A  . Number of Children: N/A  . Years of Education: N/A   Occupational History  . Not on file.   Social History Main Topics  . Smoking status: Former Smoker -- 0.25 packs/day for 1 years    Types: Cigarettes    Quit date: 03/25/2013  . Smokeless tobacco: Never Used  . Alcohol Use: No     Comment: pt states she quit marijuan in May 2013. Rare ETOH, + cigarettes.  She is enrolled in school  . Drug Use: No  . Sexual Activity: Yes    Birth Control/ Protection:  None   Other Topics Concern  . 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 Gastrointestinal: Negative for abdominal pain, nausea,vomitng, diarrhea, constipation. Genitourinary: Denies problems Musculoskeletal: Denies problems except for sickle pain in arms and back. Neurological: Denies problems Hematological: Denies problems Psychiatric/Behavioral: Denies depression, anxiety.   Objective:   Filed Vitals:   02/19/15 1327  BP: 115/53   Pulse: 88  Temp: 98.2 F (36.8 C)  Resp: 16    Physical Exam: Constitutional: Patient appears well-developed and well-nourished. No distress. HENT: Normocephalic, atraumatic, External right and left ear normal. Oropharynx is clear and moist.  Eyes: Conjunctivae and EOM are normal. PERRLA, no scleral icterus. Neck: Normal ROM. Neck supple. No lymphadenopathy, No thyromegaly. CVS: RRR, S1/S2 +, no murmurs, no gallops, no rubs Pulmonary: Effort and breath sounds normal, no stridor, rhonchi, wheezes, rales.  Abdominal: Soft. Normoactive BS,, no distension, tenderness, rebound or guarding.  Musculoskeletal: Normal range of motion. No edema and no tenderness.  Neuro: Alert.Normal muscle tone coordination. Non-focal Skin: Skin is warm and dry. No rash noted. Not diaphoretic. No erythema. No pallor. Psychiatric: Normal mood and affect. Behavior, judgment, thought content normal.  Lab Results  Component Value Date   WBC 12.1* 01/26/2015   HGB 7.8* 01/26/2015   HCT 22.6* 01/26/2015   MCV 90.0 01/26/2015   PLT 506* 01/26/2015   Lab Results  Component Value Date   CREATININE 0.87 01/26/2015   BUN 12 01/26/2015   NA 137 01/26/2015   K 4.1 01/26/2015   CL 107 01/26/2015   CO2 25 01/26/2015    No results found for: HGBA1C Lipid Panel  No results found for: CHOL, TRIG, HDL, CHOLHDL, VLDL, LDLCALC     Assessment and plan:     -Sickle Cell Disease - Have refilled her hydrea.  -She will call when she needs a refill on pain medication. -Follow up in one month.  Have reviewed need to get medications only from [place], to keep medications locked in a safe place. Reminded her that we can check on her drug use at [facility] Controlled Substance website.   Have review health maintenance needs, including immunizations, urine for proteim, dilated eye exam.    The patient was given clear instructions to go to ER or return to medical center if symptoms don't improve, worsen or new problems develop.  The patient verbalized understanding. The patient was told to call to get lab results if they haven't heard anything in the next week.     Return in about 1 month (around 03/22/2015).       Micheline Chapman, MSN, FNP-BC   02/19/2015, 2:35 PM

## 2015-02-19 NOTE — Patient Instructions (Addendum)
Continue current treatment Good luck on [employer], whether it be in [place] or here. I have sent your hydrea prescription into [facility]. Keep appointment with Dr. Doreene Burke on 03/24/15

## 2015-02-20 NOTE — Telephone Encounter (Signed)
Linda please see request below

## 2015-02-20 NOTE — Telephone Encounter (Signed)
Refill request for Methadone 5mg  and oxycodone 15mg . LOV 02/19/2015. Please advise. Thanks!

## 2015-02-23 ENCOUNTER — Other Ambulatory Visit: Payer: Self-pay | Admitting: Family Medicine

## 2015-02-23 DIAGNOSIS — D571 Sickle-cell disease without crisis: Secondary | ICD-10-CM

## 2015-02-23 DIAGNOSIS — Z3490 Encounter for supervision of normal pregnancy, unspecified, unspecified trimester: Secondary | ICD-10-CM

## 2015-02-23 DIAGNOSIS — G8929 Other chronic pain: Secondary | ICD-10-CM

## 2015-02-23 IMAGING — US IR US GUIDE VASC ACCESS RIGHT
1 series · 1 of 1 positions shown · non-contrast
Comparison: none

CLINICAL DATA: Sickle cell crisis, acute pain crisis, no current
access

[Series 1: ir fluoro guide cv midline picc *left* · 1 of 1 slices shown]
[im 1/1]
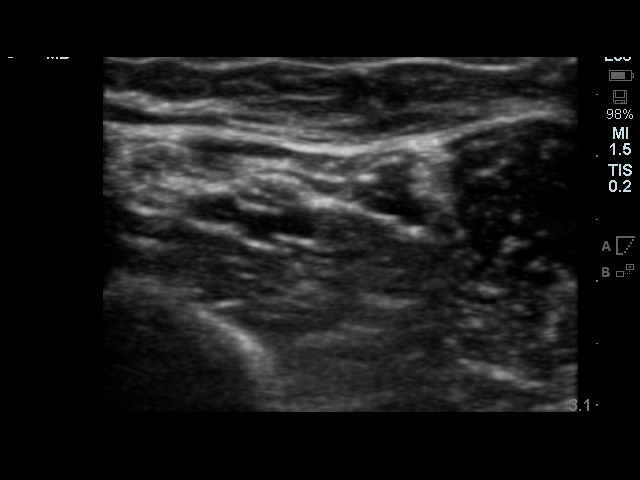

[1 of 1 positions shown; findings below may reference images not displayed]

EXAM:
POWER PICC LINE PLACEMENT WITH ULTRASOUND AND FLUOROSCOPIC GUIDANCE

FLUOROSCOPY TIME:  12 seconds

PROCEDURE:
The patient was advised of the possible risks andcomplications and
agreed to undergo the procedure. The patient was then brought to the
angiographic suite for the procedure.

The right arm was prepped with chlorhexidine, drapedin the usual
sterile fashion using maximum barrier technique (cap and mask,
sterile gown, sterile gloves, large sterile sheet, hand hygiene and
cutaneous antisepsis) and infiltrated locally with 1% Lidocaine.

Ultrasound demonstrated patency of the right brachial vein, and this
was documented with an image. Under real-time ultrasound guidance,
this vein was accessed with a 21 gauge micropuncture needle and
image documentation was performed. A [DATE] wire was introduced in to
the vein. Over this, a 5 French double lumen power PICC was advanced
to the lower SVC/right atrial junction. Fluoroscopy during the
procedure and fluoro spot radiograph confirms appropriate catheter
position. The catheter was flushed and covered with asterile
dressing.

Catheter length: 38

Complications: None immediate
IMPRESSION: Successful right arm power PICC line placement with ultrasound and
fluoroscopic guidance. The catheter is ready for use.

## 2015-02-23 MED ORDER — OXYCODONE HCL 15 MG PO TABS: 15.0000 mg | ORAL_TABLET | ORAL | Status: DC | PRN

## 2015-02-23 MED ORDER — METHADONE HCL 5 MG PO TABS: 5.0000 mg | ORAL_TABLET | Freq: Every day | ORAL | Status: DC

## 2015-02-23 MED ORDER — FOLIC ACID 1 MG PO TABS: 2.0000 mg | ORAL_TABLET | Freq: Every day | ORAL | Status: DC

## 2015-02-27 IMAGING — DX DG CHEST 1V PORT
1 series · 1 of 1 positions shown · non-contrast
Comparison: 02/16/2014

CLINICAL DATA: Fever.  Sickle cell.

EXAM:
PORTABLE CHEST - 1 VIEW

[chest ap]
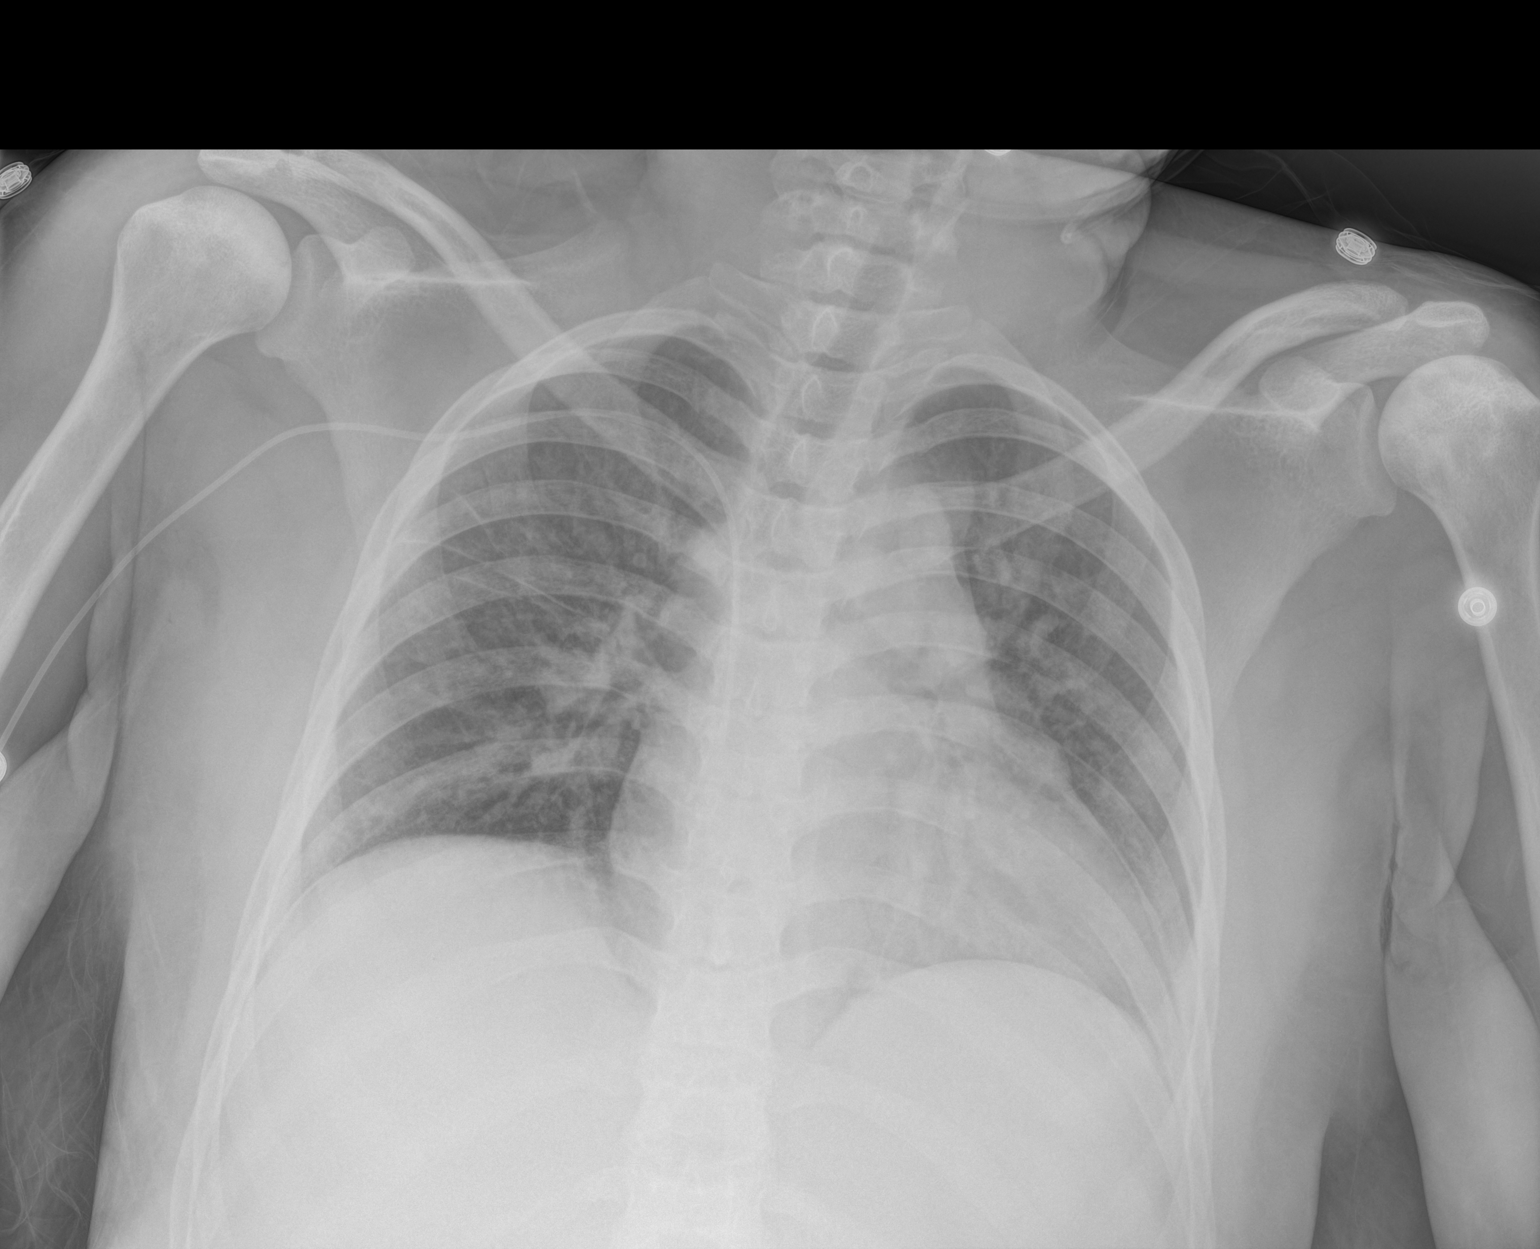

[1 of 1 positions shown; findings below may reference images not displayed]

FINDINGS: Bilateral airspace disease has developed since the prior study and
may represent pneumonia. Atelectasis and fluid overload also
possible. No effusion.

Cardiac enlargement.  Right arm PICC tip in the SVC

Bony changes of sickle cell.
IMPRESSION: Diffuse bilateral airspace disease may represent pneumonia or edema.

## 2015-02-28 IMAGING — CR DG CHEST 2V
2 series · 2 of 2 positions shown · non-contrast
Comparison: 04/14/2014

CLINICAL DATA: Cough, fever, history of pneumonia and strep throat,
sickle cell disease

EXAM:
CHEST  2 VIEW

[w chest lat]
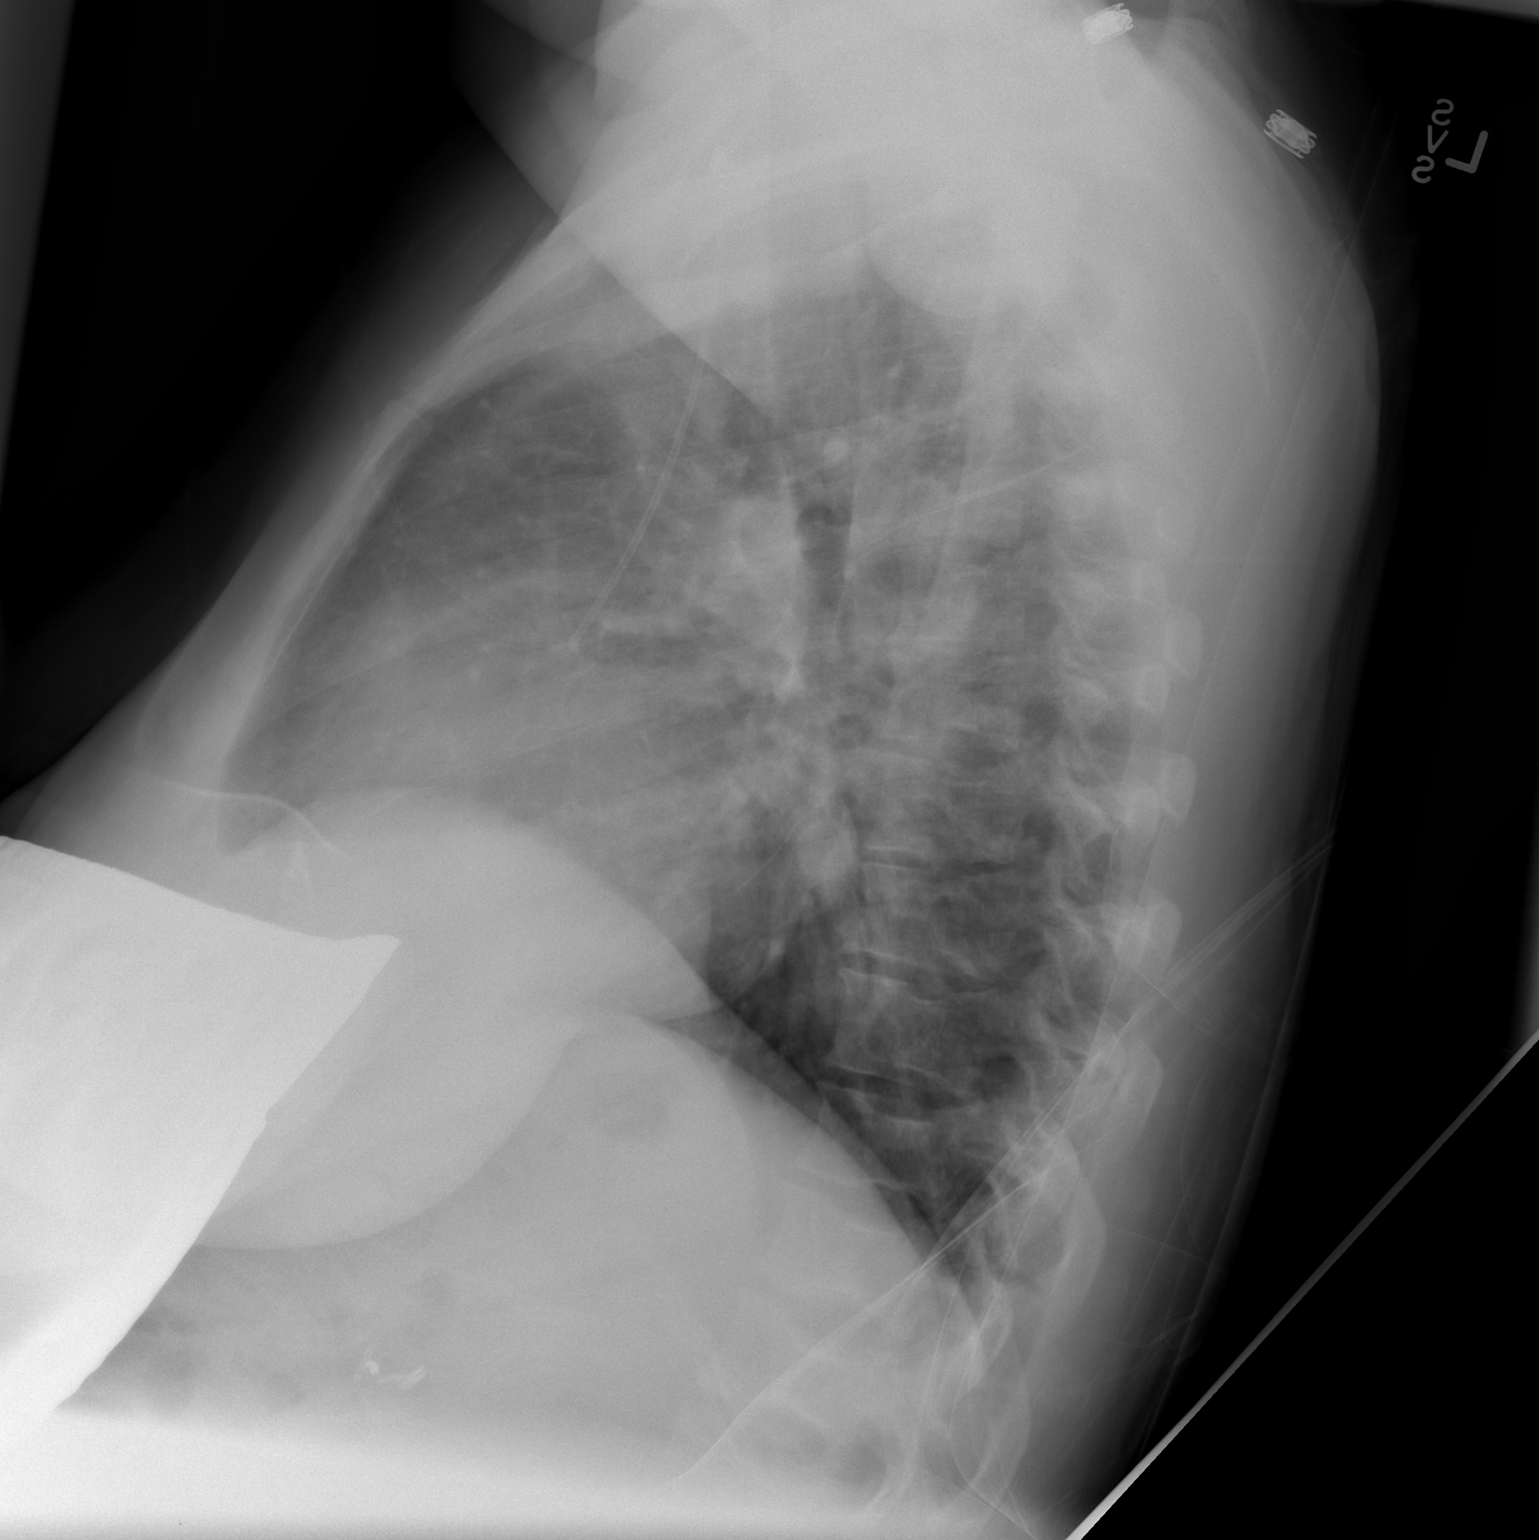

[view not recorded]
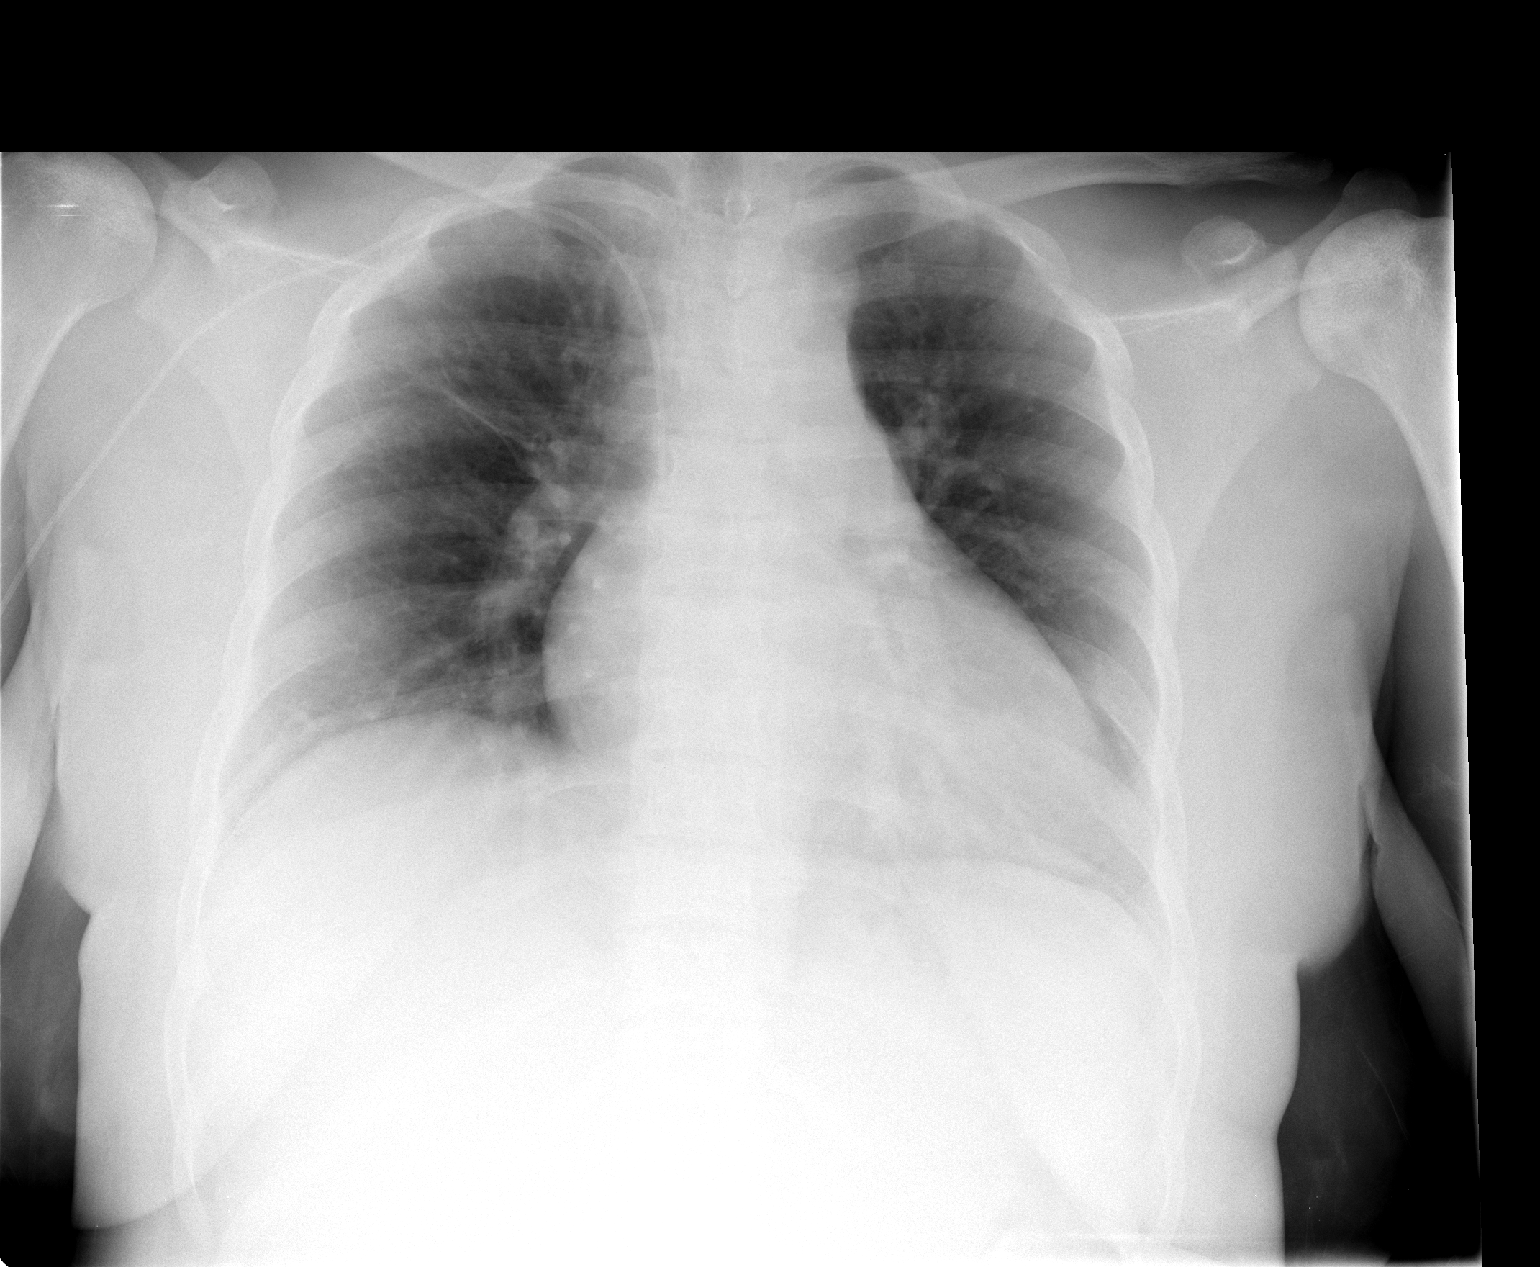

[2 of 2 positions shown; findings below may reference images not displayed]

FINDINGS: RIGHT arm PICC line with tip projecting over mid SVC.

Enlargement of cardiac silhouette with pulmonary vascular
congestion.

RIGHT upper lobe volume loss.

No gross infiltrate, pleural effusion, or pneumothorax.

Bones unremarkable.
IMPRESSION: Enlargement of cardiac silhouette with pulmonary vascular congestion
consistent with history of sickle cell disease.

No acute infiltrate.

## 2015-03-01 IMAGING — CR DG NECK SOFT TISSUE
2 series · 2 of 2 positions shown · non-contrast
Comparison: None.

CLINICAL DATA: Stridor.  Group a strep.  Initial encounter.

EXAM:
NECK SOFT TISSUES - 1+ VIEW

[view not recorded (1 of 2)]
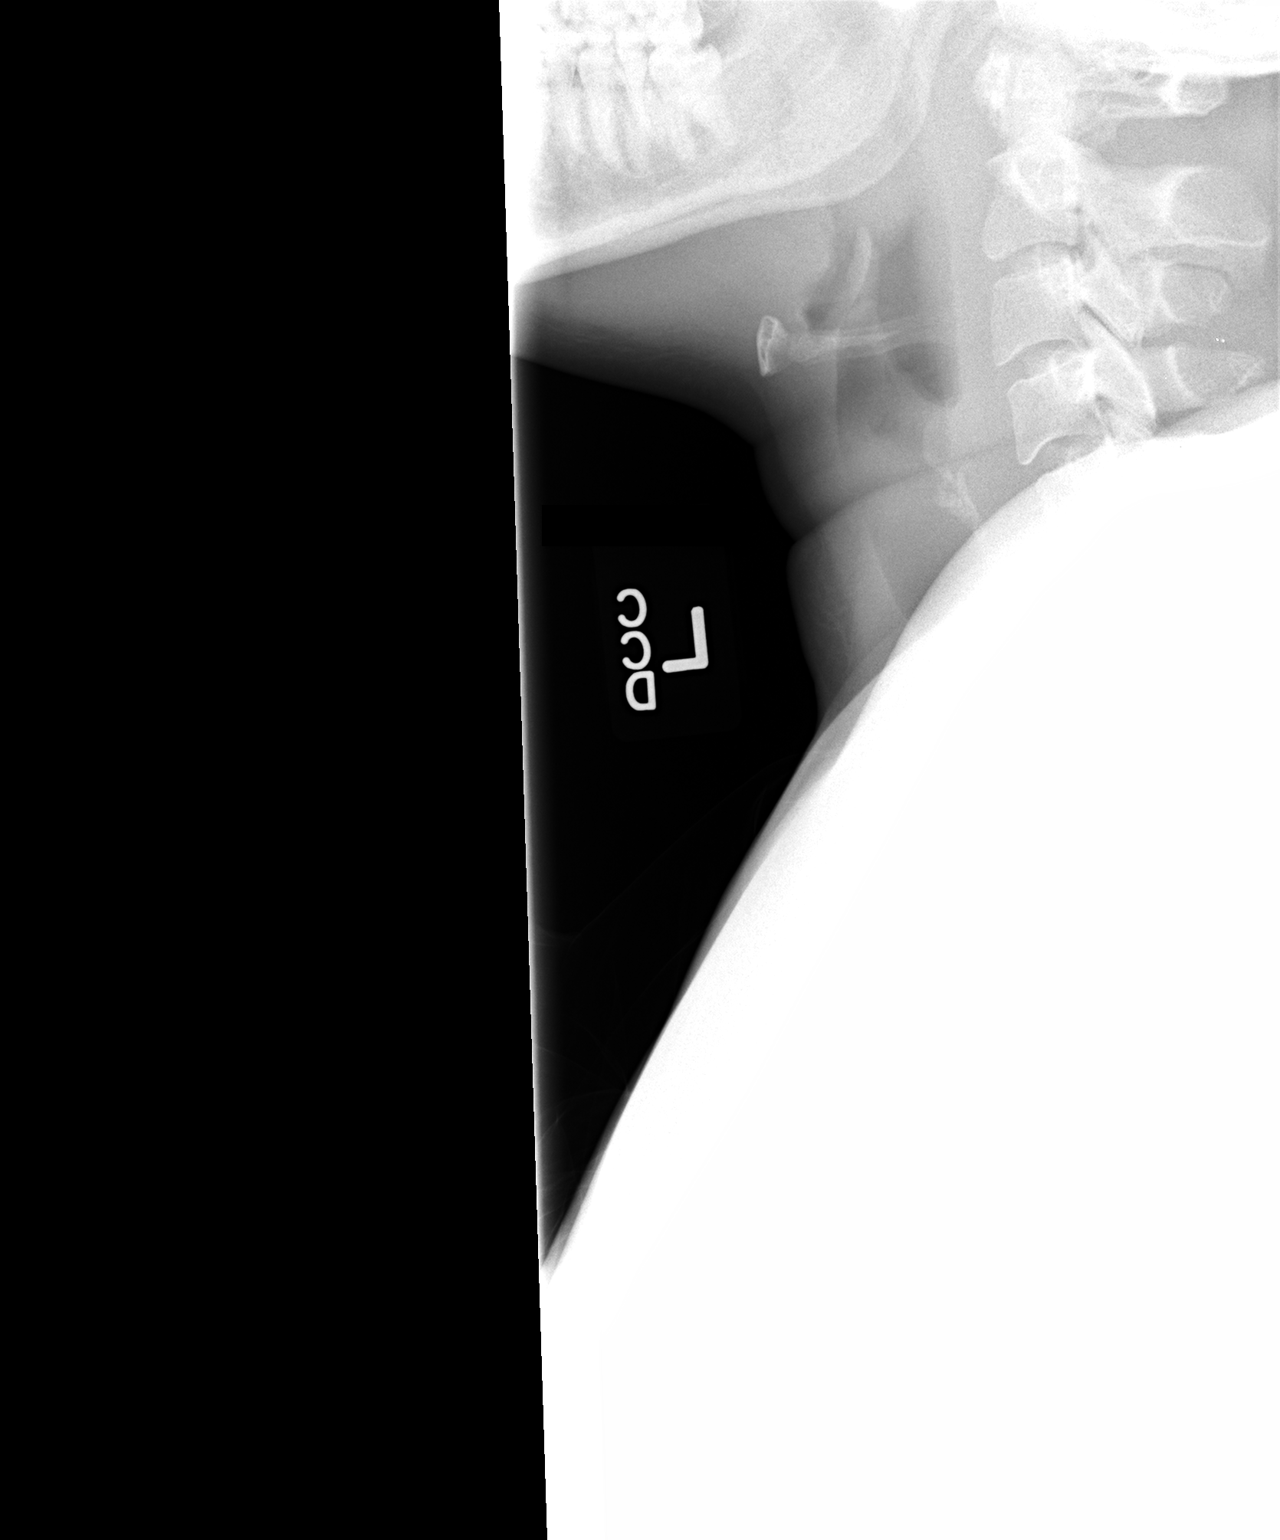

[view not recorded (2 of 2)]
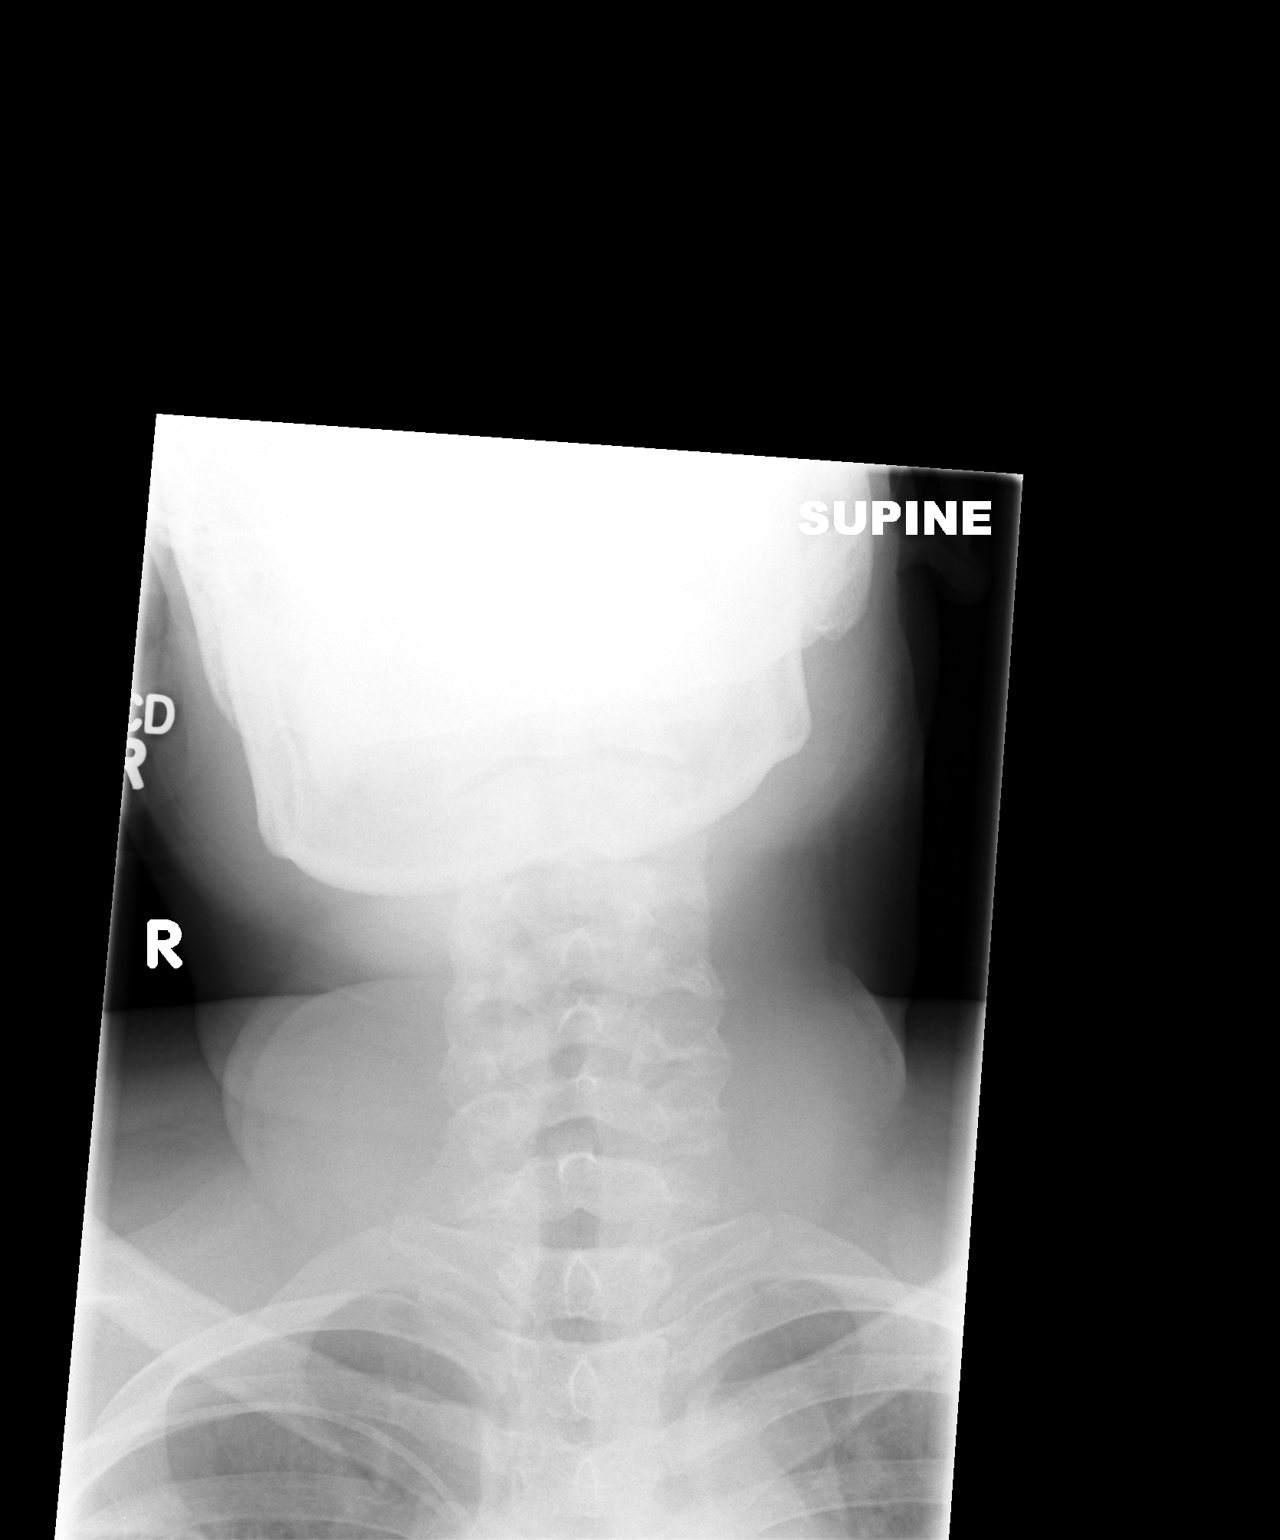

[2 of 2 positions shown; findings below may reference images not displayed]

FINDINGS: Levoconvex torticollis is present. The prevertebral soft tissues
appear within normal limits. The epiglottis is normal.
IMPRESSION: 1. Normal epiglottis.
2. Mild levoconvex torticollis.

## 2015-03-01 IMAGING — US US ABDOMEN LIMITED
1 series · 14 of 25 positions shown · non-contrast
Comparison: None.

CLINICAL DATA: Abdominal wall abscess. Sickle cell crisis.
Abdominal wall pain.

EXAM:
LIMITED ABDOMINAL ULTRASOUND

[Series 1: us abdomen limited · 0.06mm/px · 14 of 25 slices shown]
[im 1/25]
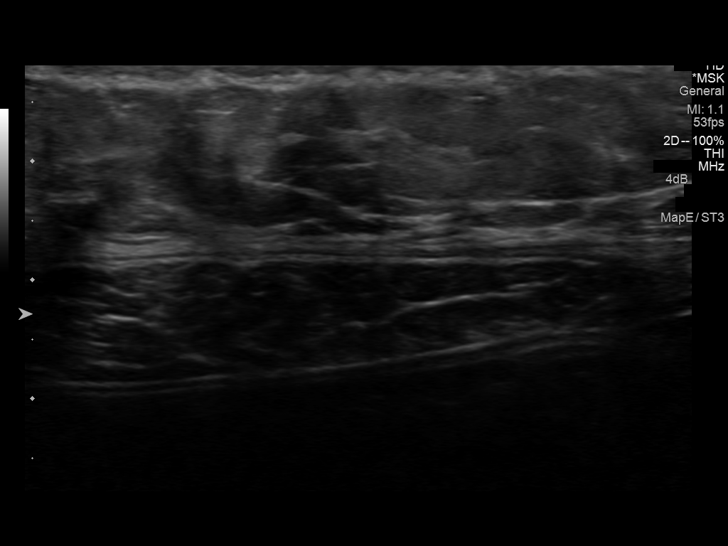
[im 3/25]
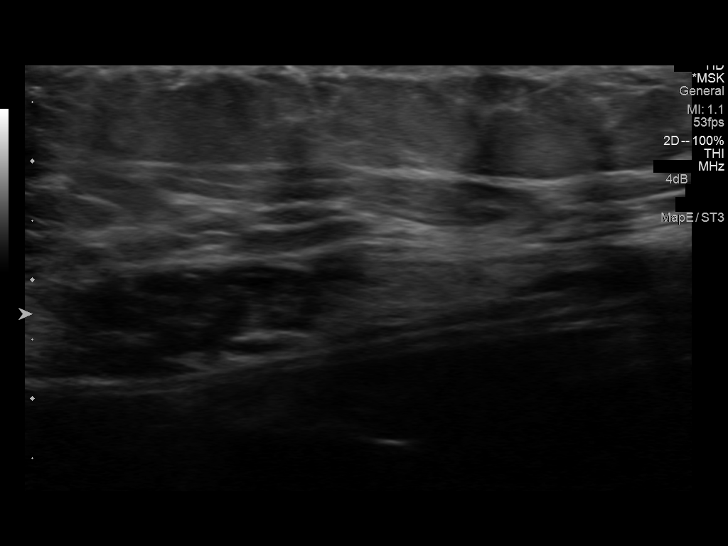
[im 5/25]
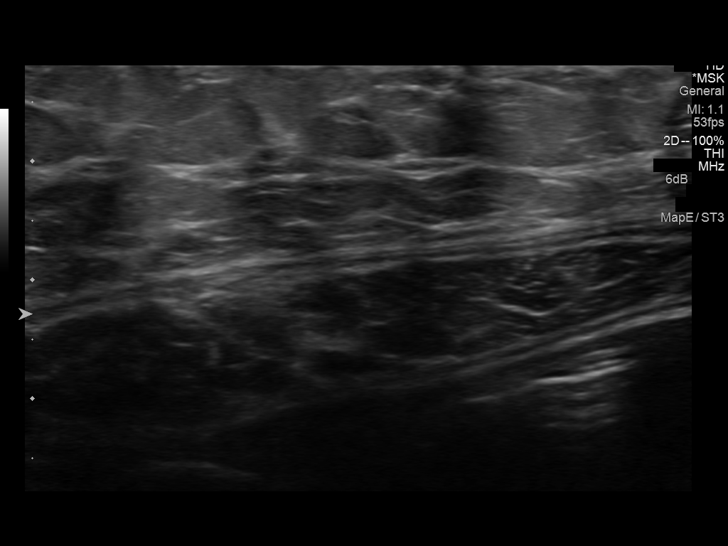
[im 7/25]
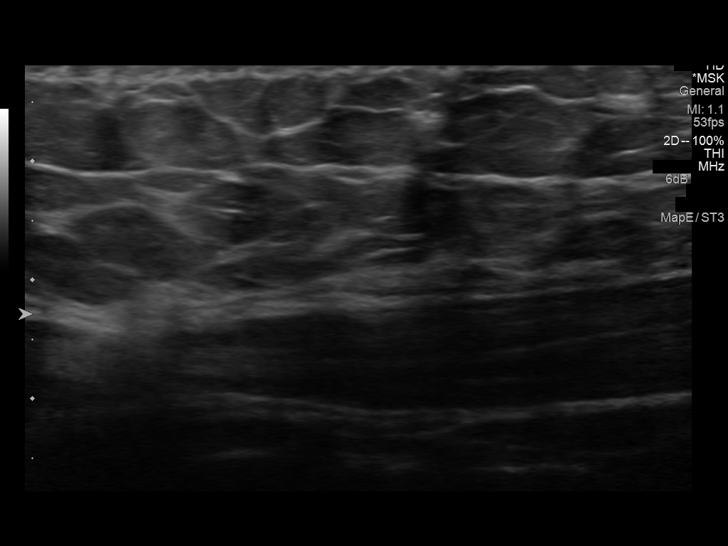
[im 9/25]
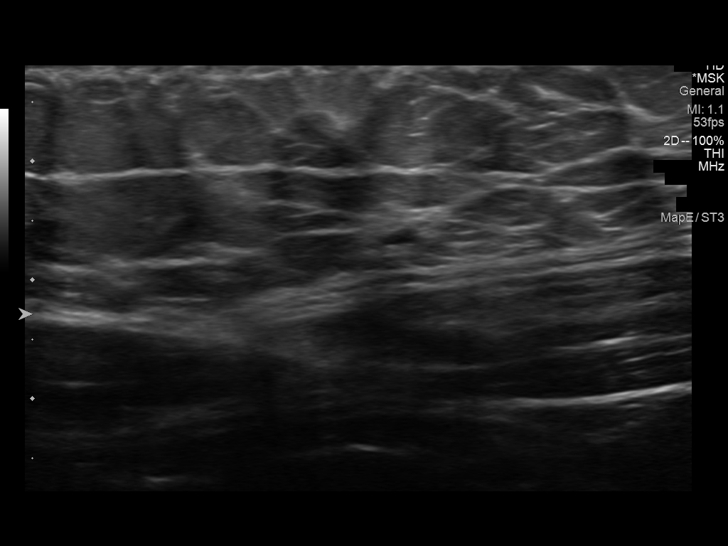
[im 10/25]
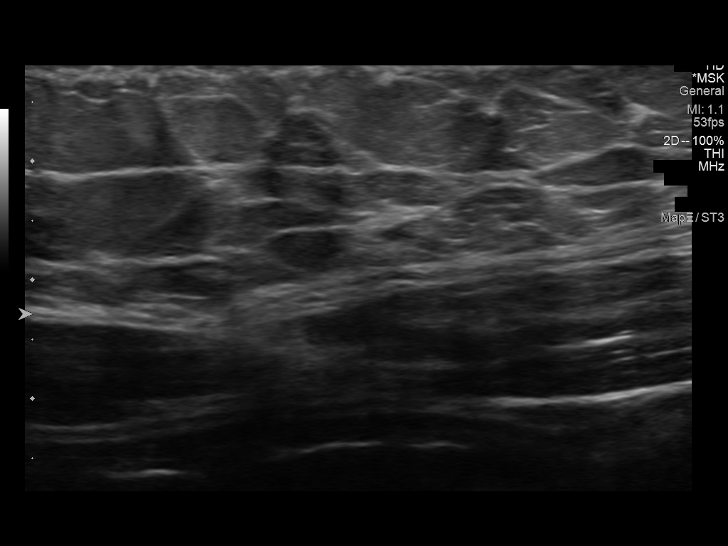
[im 12/25]
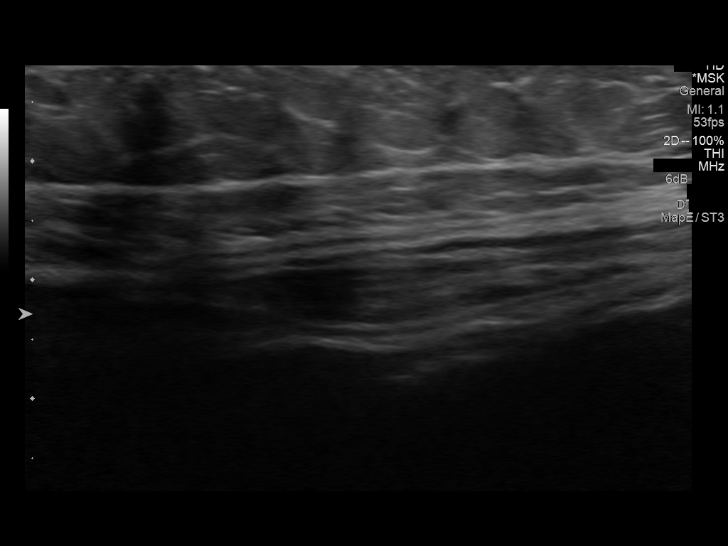
[im 14/25]
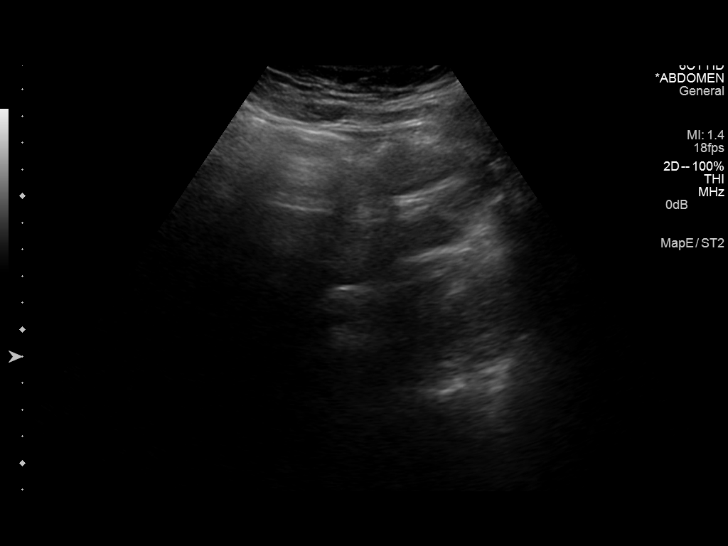
[im 16/25]
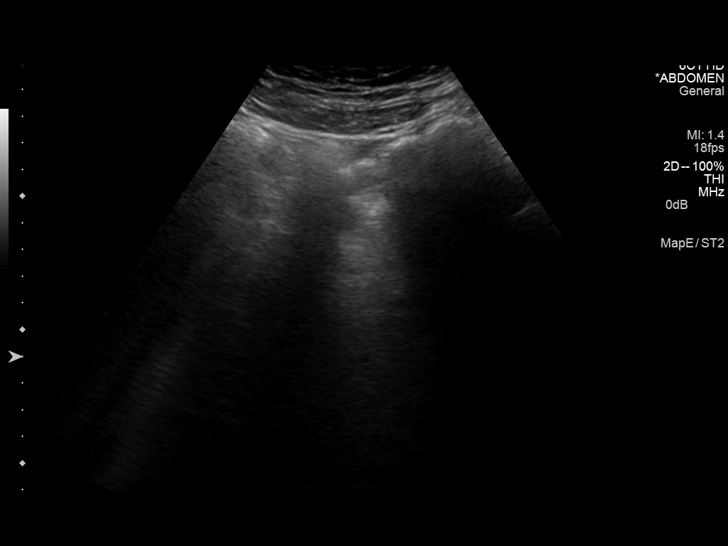
[im 17/25]
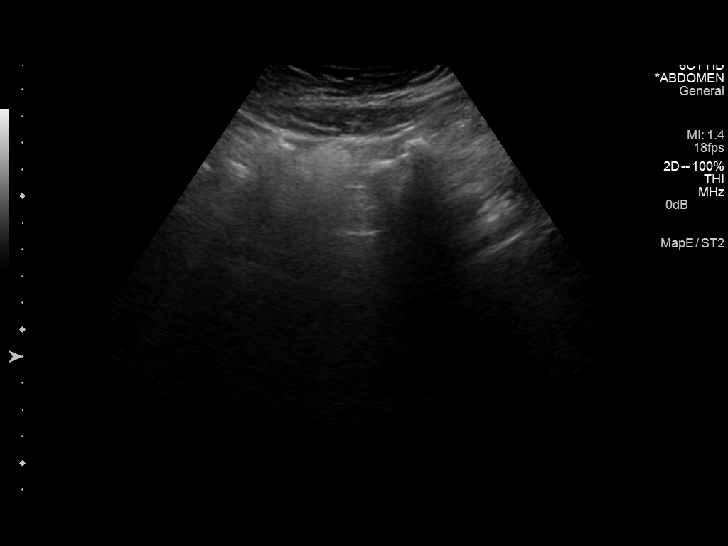
[im 19/25]
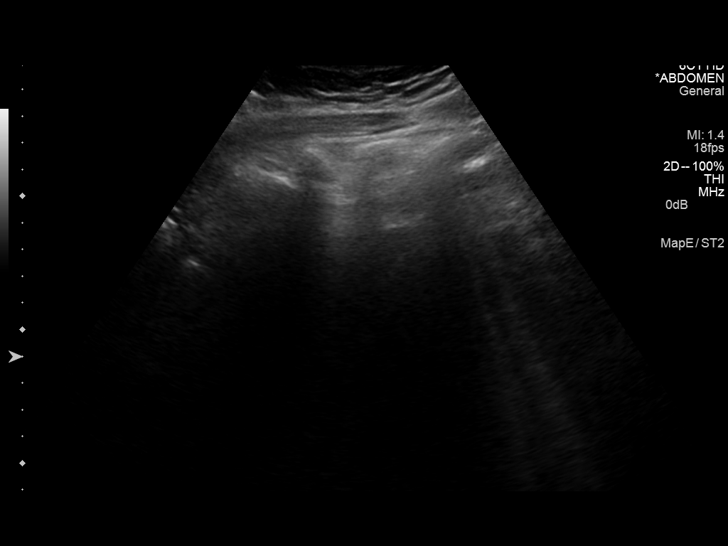
[im 21/25]
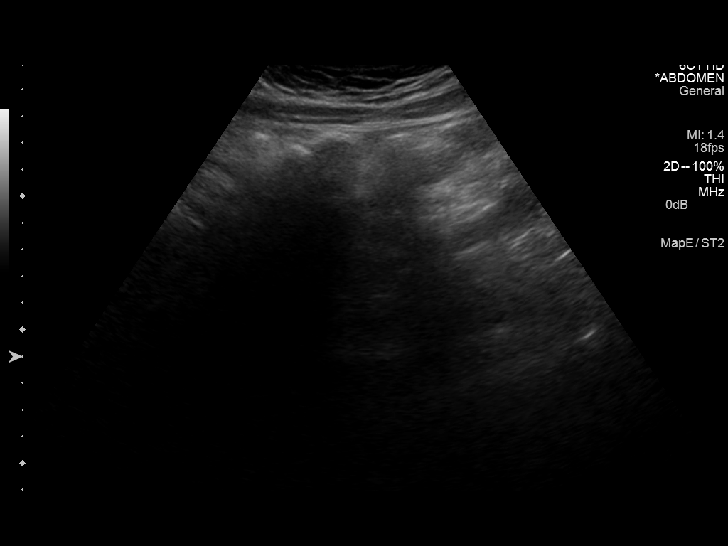
[im 23/25]
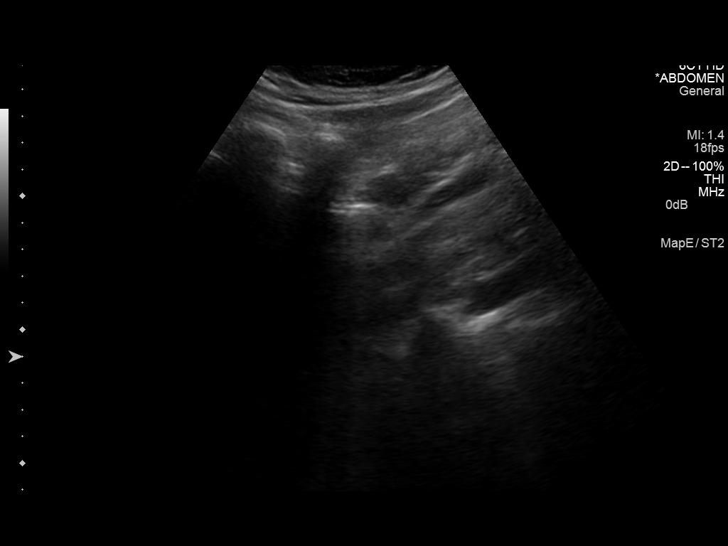
[im 25/25]
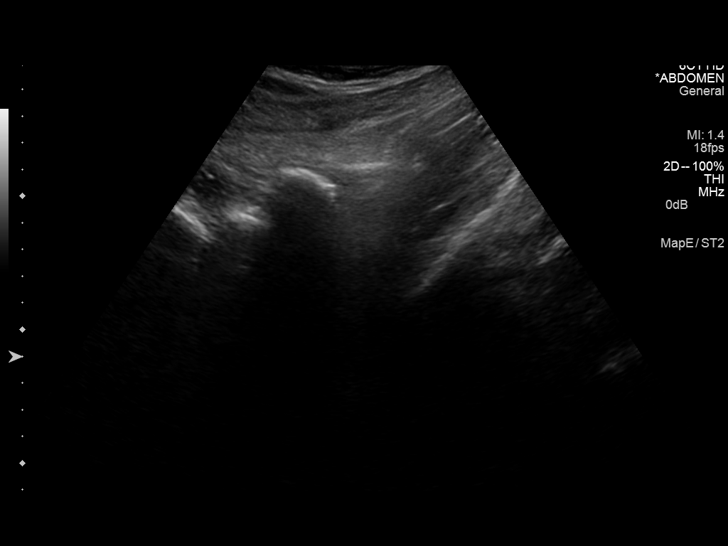

[14 of 25 positions shown; findings below may reference images not displayed]

FINDINGS: Scanning was performed over the area of pain. There was no abscess.
No subcutaneous edema was identified. The abdominal wall appears
normal.
IMPRESSION: Negative.

## 2015-03-06 ENCOUNTER — Telehealth: Payer: Self-pay | Admitting: Internal Medicine

## 2015-03-06 NOTE — Telephone Encounter (Signed)
Refill request for oxycodone 15mg . LOV 02/19/2015. Thanks!

## 2015-03-09 ENCOUNTER — Other Ambulatory Visit: Payer: Self-pay | Admitting: Family Medicine

## 2015-03-09 DIAGNOSIS — G8929 Other chronic pain: Secondary | ICD-10-CM

## 2015-03-09 MED ORDER — OXYCODONE HCL 15 MG PO TABS
15.0000 mg | ORAL_TABLET | ORAL | Status: DC | PRN
Start: 2015-03-09 — End: 2015-03-24

## 2015-03-17 IMAGING — MR MR NECK SOFT TISSUE ONLY W/O CM
4 of 7 series · 19 of 48 positions shown · non-contrast
Comparison: None.

CLINICAL DATA: Pharyngitis. Group A strep for 1 week. Worsening
neck pain and neck swelling. History of sickle cell anemia. No
contrast was administered as the patient is 6 weeks pregnant.

EXAM:
MR NECK SOFT TISSUE ONLY WITHOUT CONTRAST
TECHNIQUE: Multiplanar, multisequence MR imaging was performed. No intravenous
contrast was administered.

[Series 3: T1 · coronal · 5.0mm · 0.47mm/px · 3 of 25 slices shown]
[im 5/25]
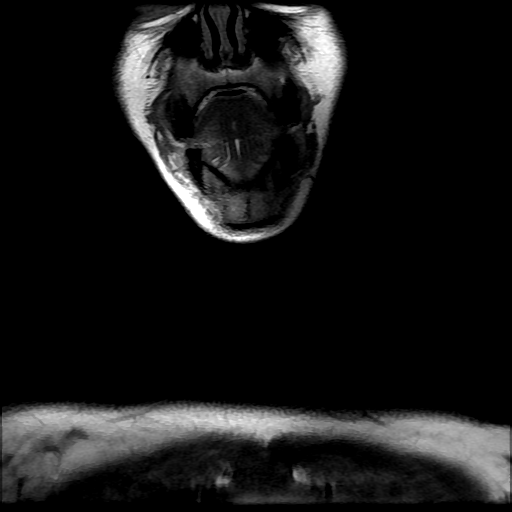
[im 15/25]
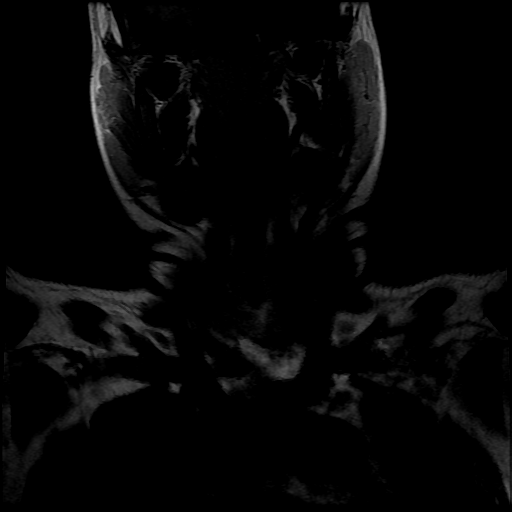
[im 25/25]
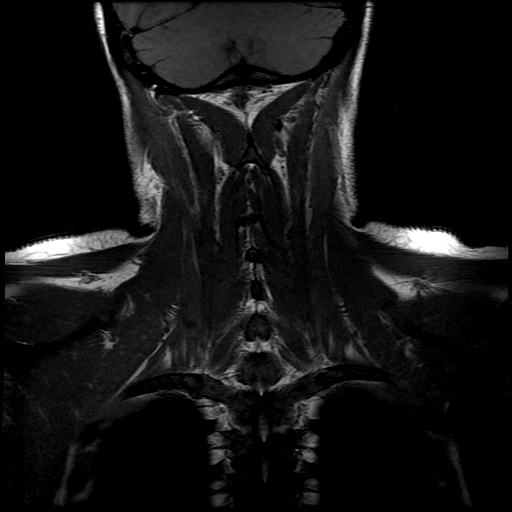

[Series 4: T2 fat-sat · coronal · 5.0mm · 0.47mm/px · 6 of 25 slices shown (1 of 2)]
[im 1/25]
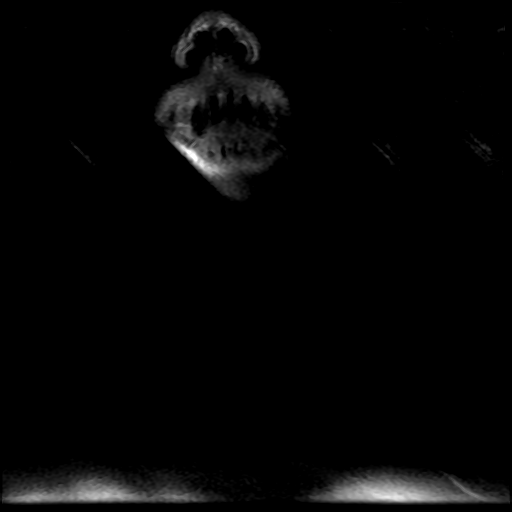
[im 5/25]
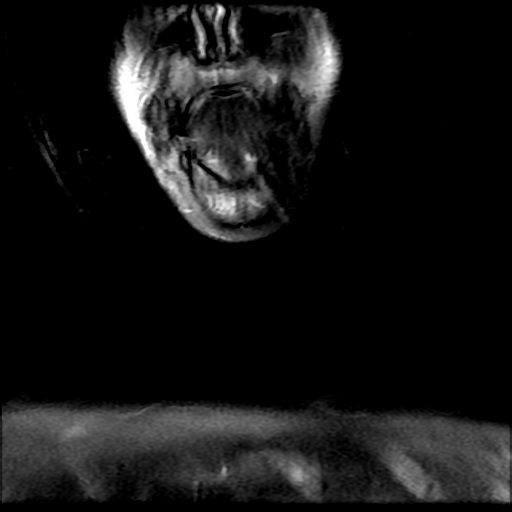
[im 10/25]
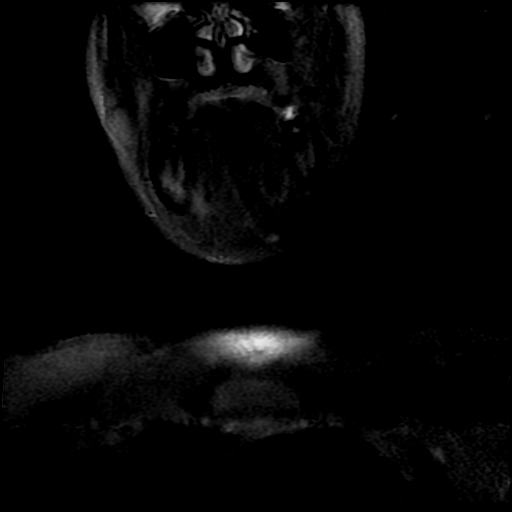
[im 15/25]
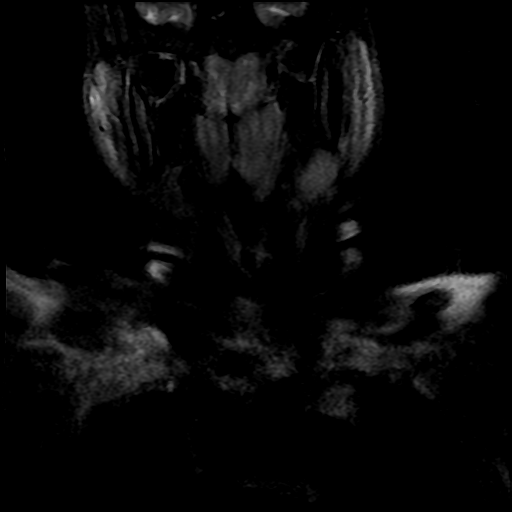
[im 20/25]
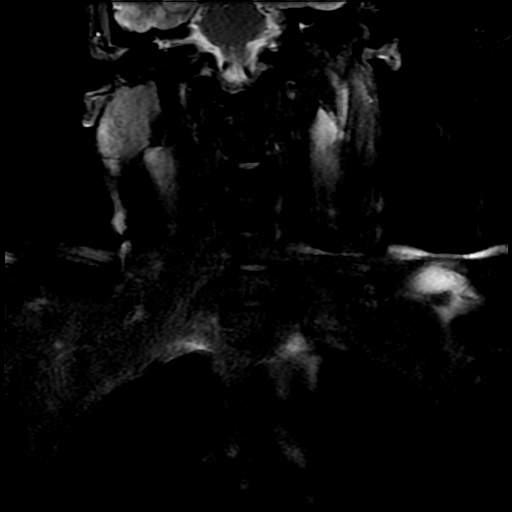
[im 25/25]
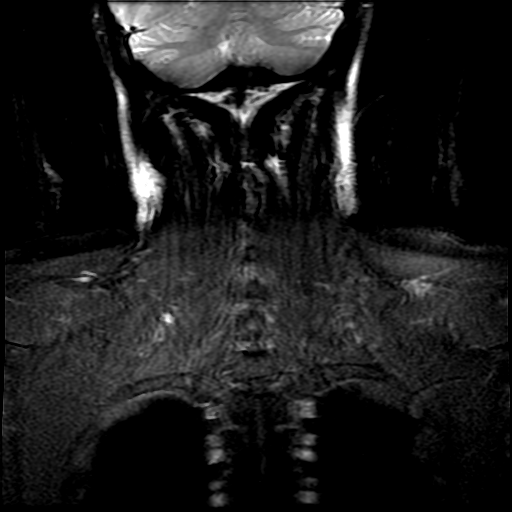

[Series 6: T2 fat-sat · sagittal · 5.0mm · 0.47mm/px · 6 of 25 slices shown (2 of 2)]
[im 1/25]
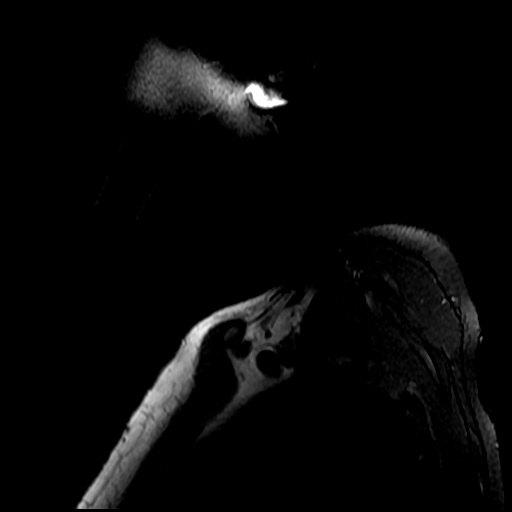
[im 5/25]
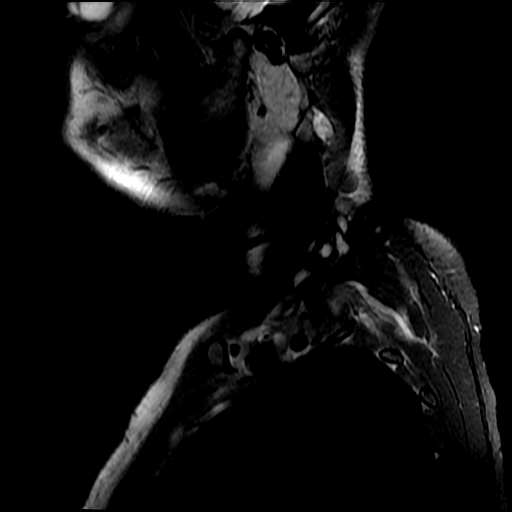
[im 10/25]
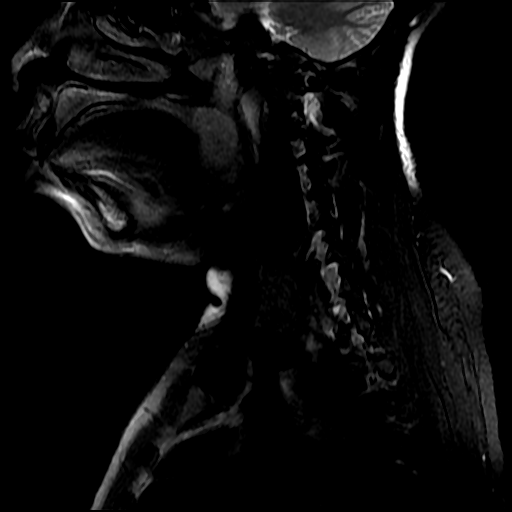
[im 15/25]
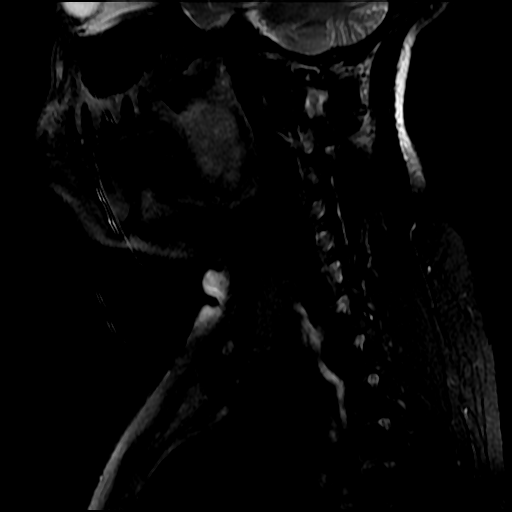
[im 20/25]
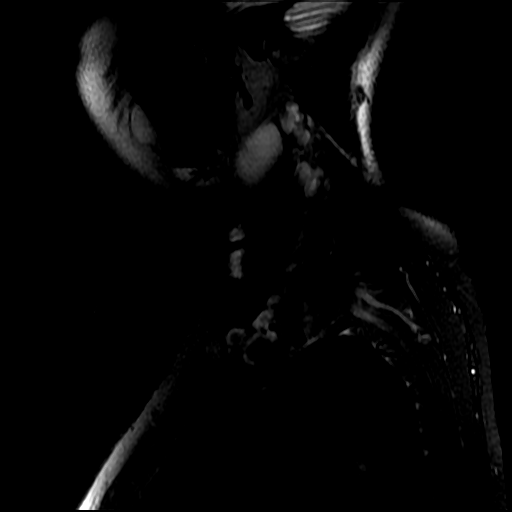
[im 25/25]
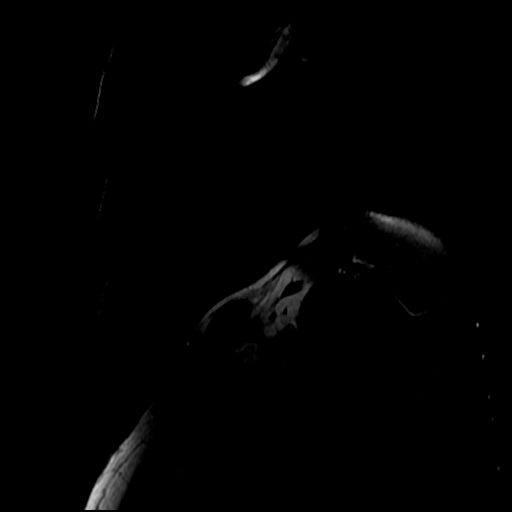

[Series 9: T2 · axial · 5.0mm · 0.47mm/px · z∈[-136,+4]mm · 4 of 30 slices shown]
[im 1/30]
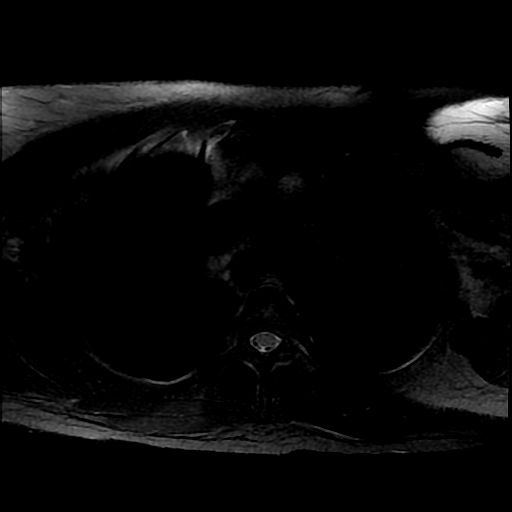
[im 5/30]
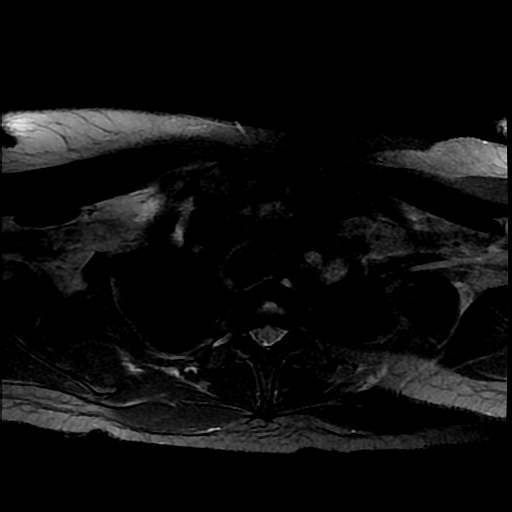
[im 17/30]
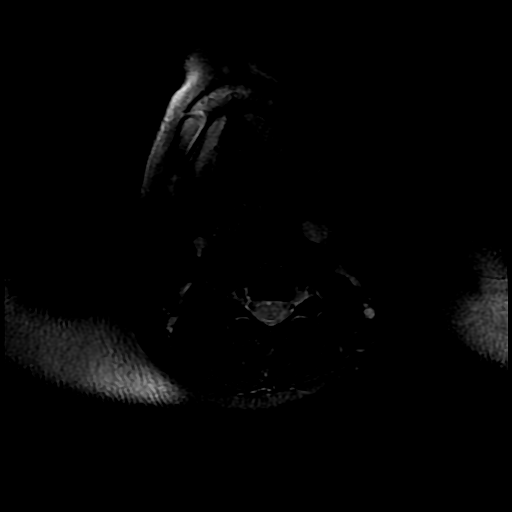
[im 25/30]
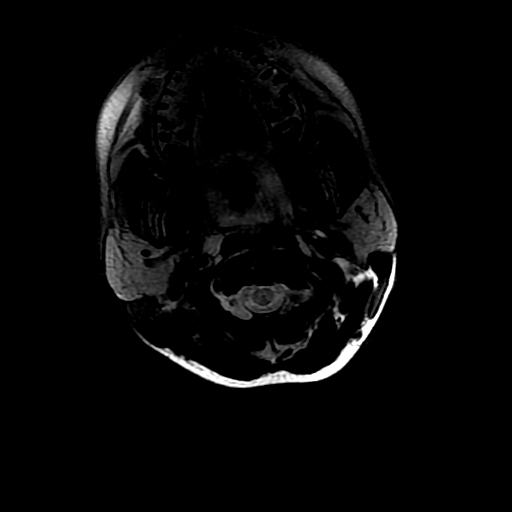

[19 of 48 positions shown; findings below may reference images not displayed]

FINDINGS: Images are mildly degraded by motion artifact intermittently.

There is moderate, symmetric enlargement of the adenoid tonsils.
There is marked enlargement of the palatine tonsils, left greater
than right, with mild prominence of the lingual tonsils as well.
Tonsillar enlargement results in prominent narrowing of the
oropharyngeal airway. No tonsillar or peritonsillar fluid collection
is identified. Trace retained fluid is present in the nasopharynx.
Larynx is unremarkable.

Enlarged bilateral level II lymph nodes measure up to 1.9 cm in
short axis bilaterally. Left level III and IV lymph nodes measure 9
and 7 mm, respectively. Small bilateral level V lymph nodes are also
present. Enlarged prevascular lymph nodes in the superior
mediastinum measure up to 1.4 cm in short axis.

Thyroid current as glands that thyroid gland is grossly
unremarkable. The submandibular and parotid glands are unremarkable.
Visualized portion the brain is unremarkable. Major vascular flow
voids of the neck appear preserved. The diffusely diminished bone
marrow signal intensity is consistent with history of sickle cell
anemia.
IMPRESSION: Marked left greater than right tonsillar enlargement with enlarged
bilateral cervical and superior mediastinal lymph nodes, most
consistent with tonsillitis and reactive lymphadenopathy given
history. Other potential etiologies, such as lymphoma, are
considered less likely given the history. No evidence of abscess.

## 2015-03-18 IMAGING — XA IR FLUORO GUIDE CV LINE*L*
1 series · 1 of 1 positions shown · non-contrast
Comparison: none

CLINICAL DATA: 23-year-old with sickle cell crisis. Patient needs
IV antibiotics.

EXAM:
PLACEMENT OF PICC LINE WITH ULTRASOUND AND FLUOROSCOPIC GUIDANCE
FLUOROSCOPY TIME:  12 seconds
TECHNIQUE: The procedure was explained to the patient. The risks and benefits
of the procedure were discussed and the patient's questions were
addressed. Informed consent was obtained from the patient. In
particular, the risks of fluoroscopy and pregnancy were discussed
with the patient. The patient's pelvis was shielded with a lead
apron for this procedure.

[Series 300: line placements · 1 of 1 slices shown]
[im 1/1]
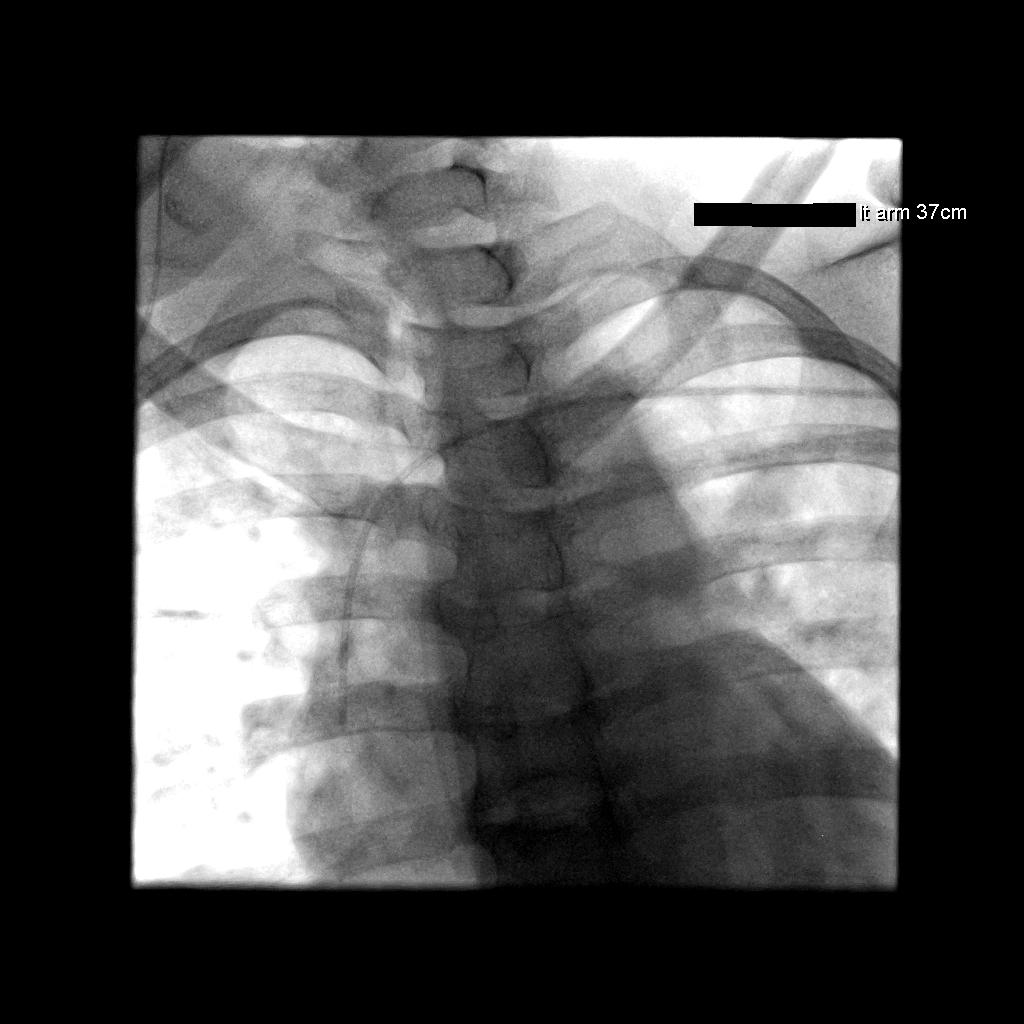

[1 of 1 positions shown; findings below may reference images not displayed]

The right arm was prepped with chlorhexidine, draped in the usual
sterile fashion using maximum barrier technique (cap and mask,
sterile gown, sterile gloves, large sterile sheet, hand hygiene and
cutaneous antiseptic). Local anesthesia was attained by infiltration
with 1% lidocaine.

Ultrasound demonstrated patency of a right brachia vein. Under
real-time ultrasound guidance, this vein was accessed with a 21
gauge micropuncture needle. Wire would not advance centrally.

Attention was directed to the left arm. Ultrasound confirmed a
patent left brachial vein. The left arm was prepped and draped in
sterile fashion. Maximal barrier sterile technique was utilized
including caps, mask, sterile gowns, sterile gloves, sterile drape,
hand hygiene and skin antiseptic. 1% lidocaine was used for local
anesthetic. 21 gauge needle directed into a left brachial vein with
ultrasound guidance. Wire was advanced centrally. A peel-away sheath
was placed. A dual lumen 5 French Power PICC line was cut to 37 cm.
Catheter was advanced into the central venous system with
fluoroscopy. Tip placed at the superior cavoatrial junction. Both
lumens aspirated and flushed well. Catheter was secured to the skin.

COMPLICATIONS:
None.  The patient tolerated the procedure well.
IMPRESSION: Successful placement of a left arm PICC with sonographic and
fluoroscopic guidance. The catheter is ready for use.

Please note that a PICC line could not be successfully placed from
the right arm.

## 2015-03-22 ENCOUNTER — Encounter (HOSPITAL_COMMUNITY): Payer: Self-pay | Admitting: Emergency Medicine

## 2015-03-22 ENCOUNTER — Other Ambulatory Visit: Payer: Self-pay

## 2015-03-22 ENCOUNTER — Inpatient Hospital Stay (HOSPITAL_COMMUNITY)
Admission: EM | Admit: 2015-03-22 | Discharge: 2015-03-26 | DRG: 812 | Disposition: A | Discharge status: Home / Self Care | Payer: Medicare Other | Attending: Internal Medicine | Admitting: Internal Medicine

## 2015-03-22 DIAGNOSIS — R52 Pain, unspecified: Secondary | ICD-10-CM | POA: Diagnosis not present

## 2015-03-22 DIAGNOSIS — Y92231 Patient bathroom in hospital as the place of occurrence of the external cause: Secondary | ICD-10-CM | POA: Diagnosis not present

## 2015-03-22 DIAGNOSIS — O9989 Other specified diseases and conditions complicating pregnancy, childbirth and the puerperium: Secondary | ICD-10-CM | POA: Diagnosis not present

## 2015-03-22 DIAGNOSIS — Z3490 Encounter for supervision of normal pregnancy, unspecified, unspecified trimester: Secondary | ICD-10-CM

## 2015-03-22 DIAGNOSIS — W1839XA Other fall on same level, initial encounter: Secondary | ICD-10-CM | POA: Diagnosis not present

## 2015-03-22 DIAGNOSIS — D638 Anemia in other chronic diseases classified elsewhere: Secondary | ICD-10-CM | POA: Diagnosis not present

## 2015-03-22 DIAGNOSIS — W19XXXA Unspecified fall, initial encounter: Secondary | ICD-10-CM | POA: Diagnosis not present

## 2015-03-22 DIAGNOSIS — Z87891 Personal history of nicotine dependence: Secondary | ICD-10-CM | POA: Diagnosis not present

## 2015-03-22 DIAGNOSIS — F329 Major depressive disorder, single episode, unspecified: Secondary | ICD-10-CM | POA: Diagnosis present

## 2015-03-22 DIAGNOSIS — Z331 Pregnant state, incidental: Secondary | ICD-10-CM | POA: Diagnosis not present

## 2015-03-22 DIAGNOSIS — Z91040 Latex allergy status: Secondary | ICD-10-CM | POA: Diagnosis not present

## 2015-03-22 DIAGNOSIS — D72829 Elevated white blood cell count, unspecified: Secondary | ICD-10-CM | POA: Diagnosis not present

## 2015-03-22 DIAGNOSIS — Z8249 Family history of ischemic heart disease and other diseases of the circulatory system: Secondary | ICD-10-CM | POA: Diagnosis not present

## 2015-03-22 DIAGNOSIS — D473 Essential (hemorrhagic) thrombocythemia: Secondary | ICD-10-CM | POA: Diagnosis present

## 2015-03-22 DIAGNOSIS — Z833 Family history of diabetes mellitus: Secondary | ICD-10-CM

## 2015-03-22 DIAGNOSIS — Z91018 Allergy to other foods: Secondary | ICD-10-CM

## 2015-03-22 DIAGNOSIS — Z79899 Other long term (current) drug therapy: Secondary | ICD-10-CM

## 2015-03-22 DIAGNOSIS — Z832 Family history of diseases of the blood and blood-forming organs and certain disorders involving the immune mechanism: Secondary | ICD-10-CM

## 2015-03-22 DIAGNOSIS — K219 Gastro-esophageal reflux disease without esophagitis: Secondary | ICD-10-CM | POA: Diagnosis present

## 2015-03-22 DIAGNOSIS — D5700 Hb-SS disease with crisis, unspecified: Secondary | ICD-10-CM | POA: Diagnosis not present

## 2015-03-22 DIAGNOSIS — D571 Sickle-cell disease without crisis: Secondary | ICD-10-CM

## 2015-03-22 DIAGNOSIS — Z79891 Long term (current) use of opiate analgesic: Secondary | ICD-10-CM

## 2015-03-22 DIAGNOSIS — K5909 Other constipation: Secondary | ICD-10-CM | POA: Diagnosis not present

## 2015-03-22 DIAGNOSIS — Z3A00 Weeks of gestation of pregnancy not specified: Secondary | ICD-10-CM | POA: Diagnosis not present

## 2015-03-22 LAB — COMPREHENSIVE METABOLIC PANEL
ALBUMIN: 4.7 g/dL (ref 3.5–5.0)
ALT: 16 U/L (ref 14–54)
AST: 42 U/L — AB (ref 15–41)
Alkaline Phosphatase: 52 U/L (ref 38–126)
Anion gap: 8 (ref 5–15)
BUN: 7 mg/dL (ref 6–20)
CHLORIDE: 104 mmol/L (ref 101–111)
CO2: 22 mmol/L (ref 22–32)
CREATININE: 0.51 mg/dL (ref 0.44–1.00)
Calcium: 9.5 mg/dL (ref 8.9–10.3)
GFR calc Af Amer: >60 mL/min (ref >60)
GFR calc non Af Amer: >60 mL/min (ref >60)
GLUCOSE: 88 mg/dL (ref 65–99)
POTASSIUM: 5 mmol/L (ref 3.5–5.1)
Sodium: 134 mmol/L — ABNORMAL LOW (ref 135–145)
Total Bilirubin: 2.1 mg/dL — ABNORMAL HIGH (ref 0.3–1.2)
Total Protein: 8 g/dL (ref 6.5–8.1)

## 2015-03-22 LAB — CBC WITH DIFFERENTIAL/PLATELET
BASOS ABS: 0.1 10*3/uL (ref 0.0–0.1)
Basophils Absolute: 0.1 10*3/uL (ref 0.0–0.1)
Basophils Relative: 0 %
Basophils Relative: 0 %
EOS PCT: 3 %
Eosinophils Absolute: 0.4 10*3/uL (ref 0.0–0.7)
Eosinophils Absolute: 0.4 10*3/uL (ref 0.0–0.7)
Eosinophils Relative: 3 %
HEMATOCRIT: 27.6 % — AB (ref 36.0–46.0)
HEMATOCRIT: 24.6 % — AB (ref 36.0–46.0)
HEMOGLOBIN: 8.4 g/dL — AB (ref 12.0–15.0)
Hemoglobin: 9.6 g/dL — ABNORMAL LOW (ref 12.0–15.0)
LYMPHS ABS: 3.2 10*3/uL (ref 0.7–4.0)
LYMPHS PCT: 22 %
Lymphocytes Relative: 25 %
Lymphs Abs: 3.2 10*3/uL (ref 0.7–4.0)
MCH: 31.9 pg (ref 26.0–34.0)
MCH: 30.9 pg (ref 26.0–34.0)
MCHC: 34.8 g/dL (ref 30.0–36.0)
MCHC: 34.1 g/dL (ref 30.0–36.0)
MCV: 91.7 fL (ref 78.0–100.0)
MCV: 90.4 fL (ref 78.0–100.0)
MONO ABS: 1.4 10*3/uL — AB (ref 0.1–1.0)
MONOS PCT: 10 %
Monocytes Absolute: 1.3 10*3/uL — ABNORMAL HIGH (ref 0.1–1.0)
Monocytes Relative: 10 %
NEUTROS ABS: 7.9 10*3/uL — AB (ref 1.7–7.7)
Neutro Abs: 9.6 10*3/uL — ABNORMAL HIGH (ref 1.7–7.7)
Neutrophils Relative %: 65 %
Neutrophils Relative %: 62 %
Platelets: 555 10*3/uL — ABNORMAL HIGH (ref 150–400)
Platelets: 477 10*3/uL — ABNORMAL HIGH (ref 150–400)
RBC: 3.01 MIL/uL — AB (ref 3.87–5.11)
RBC: 2.72 MIL/uL — AB (ref 3.87–5.11)
RDW: 15.9 % — ABNORMAL HIGH (ref 11.5–15.5)
RDW: 15.5 % (ref 11.5–15.5)
WBC: 14.6 10*3/uL — ABNORMAL HIGH (ref 4.0–10.5)
WBC: 12.8 10*3/uL — AB (ref 4.0–10.5)

## 2015-03-22 LAB — URINE MICROSCOPIC-ADD ON

## 2015-03-22 LAB — RETICULOCYTES
RBC.: 3.02 MIL/uL — AB (ref 3.87–5.11)
RETIC COUNT ABSOLUTE: 468.1 10*3/uL — AB (ref 19.0–186.0)
RETIC CT PCT: 15.5 % — AB (ref 0.4–3.1)

## 2015-03-22 LAB — URINALYSIS, ROUTINE W REFLEX MICROSCOPIC
BILIRUBIN URINE: NEGATIVE
Glucose, UA: NEGATIVE mg/dL
HGB URINE DIPSTICK: NEGATIVE
KETONES UR: NEGATIVE mg/dL
NITRITE: NEGATIVE
PH: 7.5 (ref 5.0–8.0)
Protein, ur: NEGATIVE mg/dL
Specific Gravity, Urine: 1.008 (ref 1.005–1.030)
UROBILINOGEN UA: 1 mg/dL (ref 0.0–1.0)

## 2015-03-22 LAB — MAGNESIUM: Magnesium: 2 mg/dL (ref 1.7–2.4)

## 2015-03-22 LAB — LACTATE DEHYDROGENASE: LDH: 230 U/L — ABNORMAL HIGH (ref 98–192)

## 2015-03-22 LAB — PREGNANCY, URINE: PREG TEST UR: POSITIVE — AB

## 2015-03-22 LAB — HCG, QUANTITATIVE, PREGNANCY: HCG, BETA CHAIN, QUANT, S: 14015 m[IU]/mL — AB (ref <5)

## 2015-03-22 LAB — PHOSPHORUS: Phosphorus: 4.2 mg/dL (ref 2.5–4.6)

## 2015-03-22 MED ORDER — NALOXONE HCL 0.4 MG/ML IJ SOLN: 0.4000 mg | INTRAMUSCULAR | Status: DC | PRN

## 2015-03-22 MED ORDER — PROMETHAZINE HCL 25 MG/ML IJ SOLN
12.5000 mg | Freq: Once | INTRAMUSCULAR | Status: AC
  Administered 2015-03-22: 12.5 mg via INTRAVENOUS
  Filled 2015-03-22: qty 1

## 2015-03-22 MED ORDER — DEXTROSE-NACL 5-0.45 % IV SOLN
INTRAVENOUS | Status: DC
  Administered 2015-03-22 – 2015-03-26 (×2): via INTRAVENOUS

## 2015-03-22 MED ORDER — FOLIC ACID 1 MG PO TABS
1.0000 mg | ORAL_TABLET | Freq: Every day | ORAL | Status: DC
  Administered 2015-03-23 – 2015-03-26 (×4): 1 mg via ORAL
  Filled 2015-03-22 (×6): qty 1

## 2015-03-22 MED ORDER — HYDROMORPHONE HCL 1 MG/ML IJ SOLN
1.0000 mg | Freq: Once | INTRAMUSCULAR | Status: AC
  Administered 2015-03-22: 1 mg via INTRAVENOUS
  Filled 2015-03-22: qty 1

## 2015-03-22 MED ORDER — ENOXAPARIN SODIUM 40 MG/0.4ML ~~LOC~~ SOLN
40.0000 mg | SUBCUTANEOUS | Status: DC
  Administered 2015-03-22 – 2015-03-25 (×4): 40 mg via SUBCUTANEOUS
  Filled 2015-03-22 (×6): qty 0.4

## 2015-03-22 MED ORDER — METHOCARBAMOL 1000 MG/10ML IJ SOLN
1000.0000 mg | Freq: Once | INTRAVENOUS | Status: AC
  Administered 2015-03-22: 1000 mg via INTRAVENOUS
  Filled 2015-03-22: qty 10

## 2015-03-22 MED ORDER — SODIUM CHLORIDE 0.9 % IJ SOLN: 9.0000 mL | INTRAMUSCULAR | Status: DC | PRN

## 2015-03-22 MED ORDER — HYDROMORPHONE HCL 2 MG/ML IJ SOLN
2.0000 mg | Freq: Once | INTRAMUSCULAR | Status: AC
  Administered 2015-03-22: 2 mg via INTRAVENOUS
  Filled 2015-03-22: qty 1

## 2015-03-22 MED ORDER — HYDROMORPHONE HCL 2 MG/ML IJ SOLN: 0.0250 mg/kg | INTRAMUSCULAR | Status: AC

## 2015-03-22 MED ORDER — HYDROXYUREA 500 MG PO CAPS
500.0000 mg | ORAL_CAPSULE | Freq: Every day | ORAL | Status: DC
  Administered 2015-03-22 – 2015-03-25 (×4): 500 mg via ORAL
  Filled 2015-03-22 (×4): qty 1

## 2015-03-22 MED ORDER — CYCLOBENZAPRINE HCL 10 MG PO TABS: 10.0000 mg | ORAL_TABLET | Freq: Once | ORAL | Status: DC

## 2015-03-22 MED ORDER — DIPHENHYDRAMINE HCL 50 MG/ML IJ SOLN
25.0000 mg | Freq: Once | INTRAMUSCULAR | Status: AC
  Administered 2015-03-22: 25 mg via INTRAVENOUS
  Filled 2015-03-22: qty 1

## 2015-03-22 MED ORDER — PROMETHAZINE HCL 25 MG/ML IJ SOLN: 12.5000 mg | INTRAMUSCULAR | Status: DC | PRN

## 2015-03-22 MED ORDER — HYDROMORPHONE 1 MG/ML IV SOLN
INTRAVENOUS | Status: DC
  Administered 2015-03-22: 1 mg via INTRAVENOUS
  Administered 2015-03-23 (×2): 3 mg via INTRAVENOUS
  Filled 2015-03-22: qty 25

## 2015-03-22 MED ORDER — PROMETHAZINE HCL 25 MG PO TABS
25.0000 mg | ORAL_TABLET | ORAL | Status: DC | PRN
  Filled 2015-03-22: qty 1

## 2015-03-22 MED ORDER — DIPHENHYDRAMINE HCL 25 MG PO CAPS
25.0000 mg | ORAL_CAPSULE | Freq: Once | ORAL | Status: AC
  Administered 2015-03-22: 25 mg via ORAL
  Filled 2015-03-22: qty 1

## 2015-03-22 MED ORDER — KETOROLAC TROMETHAMINE 30 MG/ML IJ SOLN
30.0000 mg | INTRAMUSCULAR | Status: AC
  Administered 2015-03-22: 30 mg via INTRAVENOUS
  Filled 2015-03-22: qty 1

## 2015-03-22 MED ORDER — DIPHENHYDRAMINE HCL 50 MG/ML IJ SOLN: 25.0000 mg | Freq: Four times a day (QID) | INTRAMUSCULAR | Status: DC | PRN

## 2015-03-22 MED ORDER — SENNOSIDES-DOCUSATE SODIUM 8.6-50 MG PO TABS
1.0000 | ORAL_TABLET | Freq: Two times a day (BID) | ORAL | Status: DC
  Administered 2015-03-22 – 2015-03-26 (×6): 1 via ORAL
  Filled 2015-03-22 (×13): qty 1

## 2015-03-22 MED ORDER — CYCLOBENZAPRINE HCL 10 MG PO TABS: 10.0000 mg | ORAL_TABLET | Freq: Three times a day (TID) | ORAL | Status: DC | PRN

## 2015-03-22 MED ORDER — POLYETHYLENE GLYCOL 3350 17 G PO PACK
17.0000 g | PACK | Freq: Every day | ORAL | Status: DC | PRN
  Administered 2015-03-25: 17 g via ORAL
  Filled 2015-03-22: qty 1

## 2015-03-22 MED ORDER — ONDANSETRON HCL 4 MG/2ML IJ SOLN: 4.0000 mg | Freq: Four times a day (QID) | INTRAMUSCULAR | Status: DC | PRN

## 2015-03-22 MED ORDER — DEXTROSE-NACL 5-0.45 % IV SOLN
INTRAVENOUS | Status: DC
  Administered 2015-03-22: 15:00:00 via INTRAVENOUS

## 2015-03-22 MED ORDER — HYDROMORPHONE HCL 2 MG/ML IJ SOLN
0.0250 mg/kg | INTRAMUSCULAR | Status: AC
Start: 2015-03-22 — End: 2015-03-22
  Administered 2015-03-22: 2 mg via INTRAVENOUS
  Filled 2015-03-22: qty 1

## 2015-03-22 NOTE — ED Notes (Signed)
Dr. Olevia Bowens, Hospitalist in room.

## 2015-03-22 NOTE — ED Provider Notes (Signed)
CSN: [id number]     Arrival date & time 03/22/15  1050 History   First MD Initiated Contact with Patient 03/22/15 1358     Chief Complaint  Patient presents with  . Sickle Cell Pain Crisis     (Consider location/radiation/quality/duration/timing/severity/associated sxs/prior Treatment) Patient is a 24 y.o. female presenting with sickle cell pain. The history is provided by the patient.  Sickle Cell Pain Crisis Associated symptoms: no chest pain, no fatigue and no shortness of breath    patient presents with diffuse pain. States it feels like her typical sickle cell pain. No cough. No fevers. Has had back pain since delivering her child around 4 months ago. Pain has been overall controlled with oral medications up until a few days ago. She is on methadone and oxycodone.. No fevers or chills. No shortness of breath. No abdominal pain.  Past Medical History  Diagnosis Date  . Miscarriage 03/22/2011    Pt reports 2 miscarriages.  . Depression 01/06/2011  . GERD (gastroesophageal reflux disease) 02/17/2011  . Trichotillomania     h/o  . Blood transfusion     "lots"  . Sickle cell anemia with crisis ([place])   . Exertional dyspnea     "sometimes"  . Migraines 11/08/11    "@ least twice/month"  . Chronic back pain     "very severe; have knot in my back; from tight muscle; take RX and exercise for it"  . Mood swings ([place]) 11/08/11    "I go back and forth; real bad"  . Sickle cell anemia [facility])    Past Surgical History  Procedure Laterality Date  . Cholecystectomy  05/2010  . Dilation and curettage of uterus  02/20/11    S/P miscarriage   Family History  Problem Relation Age of Onset  . Diabetes Maternal Grandmother   . Diabetes Paternal Grandmother   . Hypertension Paternal Grandmother   . Diabetes Maternal Grandfather   . Sickle cell trait Mother   . Sickle cell trait Father    Social History  Substance Use Topics  . Smoking status: Former Smoker -- 0.25 packs/day for 1 years   Types: Cigarettes    Quit date: 03/25/2013  . Smokeless tobacco: Never Used  . Alcohol Use: No     Comment: pt states she quit marijuan in May 2013. Rare ETOH, + cigarettes.  She is enrolled in school   OB History    Gravida Para Term Preterm AB TAB SAB Ectopic Multiple Living   2 1 1  1  1   0 1      Obstetric Comments   Miscarried in October 2012 at about 7 weeks     Review of Systems  Constitutional: Negative for appetite change and fatigue.  Respiratory: Negative for shortness of breath.   Cardiovascular: Negative for chest pain.  Gastrointestinal: Negative for abdominal pain.  Genitourinary: Negative for flank pain.  Musculoskeletal: Positive for myalgias and back pain.  Skin: Negative for wound.  Neurological: Negative for light-headedness.      Allergies  Carrot; Carrot oil; and Latex  Home Medications   Prior to Admission medications   Medication Sig Start Date End Date Taking? Authorizing Provider  hydroxyurea (HYDREA) 500 MG capsule Take 1 capsule (500 mg total) by mouth daily. May take with food to minimize GI side effects. 02/19/15  Yes Micheline Chapman, NP  methadone (DOLOPHINE) 5 MG tablet Take 1 tablet (5 mg total) by mouth at bedtime. 02/23/15  Yes Micheline Chapman, NP  oxyCODONE (ROXICODONE) 15 MG immediate release tablet Take 1 tablet (15 mg total) by mouth every 4 (four) hours as needed for pain. 03/09/15  Yes Micheline Chapman, NP  folic acid (FOLVITE) 1 MG tablet Take 2 tablets (2 mg total) by mouth daily. Patient not taking: Reported on 03/22/2015 02/23/15   Micheline Chapman, NP  ibuprofen (ADVIL,MOTRIN) 800 MG tablet Take 1 tablet (800 mg total) by mouth every 8 (eight) hours as needed. Patient not taking: Reported on 03/22/2015 12/10/14   Caren Macadam, MD   BP 101/49 mmHg  Pulse 91  Temp(Src) 98.1 F (36.7 C) (Oral)  Resp 16  Ht 5\' 1"  (1.549 m)  Wt 175 lb (79.379 kg)  BMI 33.08 kg/m2  SpO2 99%  LMP 01/20/2015 (Approximate) Physical  Exam  Constitutional: She appears well-developed and well-nourished.  HENT:  Head: Atraumatic.  Neck: Neck supple.  Cardiovascular: Normal rate and regular rhythm.   Pulmonary/Chest: Effort normal.  Abdominal: There is no tenderness.  Musculoskeletal: Normal range of motion.  Mild lumbar tenderness.  Neurological: She is alert.  Skin: Skin is warm.    ED Course  Procedures (including critical care time) Labs Review Labs Reviewed  COMPREHENSIVE METABOLIC PANEL - Abnormal; Notable for the following:    Sodium 134 (*)    AST 42 (*)    Total Bilirubin 2.1 (*)    All other components within normal limits  CBC WITH DIFFERENTIAL/PLATELET - Abnormal; Notable for the following:    WBC 14.6 (*)    RBC 3.01 (*)    Hemoglobin 9.6 (*)    HCT 27.6 (*)    RDW 15.9 (*)    Platelets 555 (*)    Neutro Abs 9.6 (*)    Monocytes Absolute 1.4 (*)    All other components within normal limits  RETICULOCYTES - Abnormal; Notable for the following:    Retic Ct Pct 15.5 (*)    RBC. 3.02 (*)    Retic Count, Manual 468.1 (*)    All other components within normal limits  URINALYSIS, ROUTINE W REFLEX MICROSCOPIC (NOT AT [facility]) - Abnormal; Notable for the following:    APPearance HAZY (*)    Leukocytes, UA SMALL (*)    All other components within normal limits  PREGNANCY, URINE - Abnormal; Notable for the following:    Preg Test, Ur POSITIVE (*)    All other components within normal limits  URINE MICROSCOPIC-ADD ON - Abnormal; Notable for the following:    Squamous Epithelial / LPF MANY (*)    All other components within normal limits  HCG, QUANTITATIVE, PREGNANCY    Imaging Review No results found. I have personally reviewed and evaluated these images and lab results as part of my medical decision-making.   EKG Interpretation None      MDM   Final diagnoses:  Sickle cell pain crisis [facility])  Pregnant    Patient with sickle cell pain crisis. Positive pregnancy test. Hemoglobin at  baseline. Pain not relieved with initial dose of pain medicine. Will re-dose. Also found a positive urine pregnancy test. Will recheck with blood test. Patient states she does not want to keep the baby.Davonna Belling, MD 03/22/15 [phone]

## 2015-03-22 NOTE — ED Notes (Addendum)
UNSUCCESSFUL X2 to obtain blood

## 2015-03-22 NOTE — ED Provider Notes (Signed)
Received care of patient at 4 PM from Dr. Alvino Chapel. Please see this is a 24 year old female with a history of sickle cell disease who presents with pain consistent with prior sickle cell pain crises. In addition patient has a positive pregnancy test. She has no abdominal pain, with back pain similar to prior sickle cell crises, so [place] not ordered to eval pregnancy location.  Patient states she does not want to keep the pregnancy (interested in terminating preg) and focus is on caring for her pain.   Patient's pain not relieved with dilaudid/toradol in ED.  Will admit for pain control for sickle cell pain crisis.  Gareth Morgan, MD 03/22/15 [phone]

## 2015-03-22 NOTE — ED Notes (Signed)
Pt reported sickle cell crisis with pain to back, nausea without vomiting and abd pain, no fever.

## 2015-03-22 NOTE — ED Notes (Signed)
Dr. Billy Fischer informed of pt's request for admission.

## 2015-03-22 NOTE — ED Notes (Signed)
Pt now stating "tell the doctor to go ahead and admit me.  I need another day."

## 2015-03-22 NOTE — H&P (Signed)
[facility] History and Physical  AMEN DARGIS RXV:[id number] DOB: [birth year] DOA: 03/22/2015  Referring physician: Gareth Morgan, MD PCP: Angelica Chessman, MD   Chief Complaint: Sickle cell pain crisis  HPI: Nancy RICKETT is a 24 y.o. female with a past medical history of sickle cell anemia, depression, GERD, migraine headaches, chronic back pain who comes to the hospital emergency department due to sickle cell pain crisis for the past 4 days which has not been controlled at home with oral medication. She denies fever, chills, but feels fatigue. She denies headache, sore throat, runny nose, productive cough, complaints of nausea, but denies abdominal pain, emesis, diarrhea, melena or hematochezia. She denies GU symptoms as well. Her last menstrual period was at the end of September, but could not specify the exact date.  In the ER, the patient has received analgesics, antiemetics and antihistamine with partial relief. She is currently in no acute distress.Workup revealed the patient is a few weeks pregnant, but declined further evaluation stating that she will terminate the pregnancy due to having significant difficulty with her last pregnancy and 84 hour long delivery per patient.    Review of Systems:  Constitutional:  Positive fatigue.  No weight loss, night sweats, Fevers, chills HEENT:  No headaches, Difficulty swallowing,Tooth/dental problems,Sore throat,  No sneezing, itching, ear ache, nasal congestion, post nasal drip,  Cardio-vascular:  No chest pain, Orthopnea, PND, swelling in lower extremities, anasarca, dizziness, palpitations  GI:  Positive nausea No heartburn, indigestion, abdominal pain, vomiting, diarrhea, change in bowel habits, loss of appetite  Resp:  No shortness of breath with exertion or at rest. No excess mucus, no productive cough, No non-productive cough, No coughing up of blood.No change in color of mucus.No wheezing.No chest wall  deformity  Skin:  no rash or lesions.  GU:  no dysuria, change in color of urine, no urgency or frequency. No flank pain.  Musculoskeletal:  Positive back pain and upper and lower extremities myalgias.  Denies arthralgias. No decreased range of motion.  Psych:  No change in mood or affect. No depression or anxiety. No memory loss.   Past Medical History  Diagnosis Date  . Miscarriage 03/22/2011    Pt reports 2 miscarriages.  . Depression 01/06/2011  . GERD (gastroesophageal reflux disease) 02/17/2011  . Trichotillomania     h/o  . Blood transfusion     "lots"  . Sickle cell anemia with crisis ([place])   . Exertional dyspnea     "sometimes"  . Migraines 11/08/11    "@ least twice/month"  . Chronic back pain     "very severe; have knot in my back; from tight muscle; take RX and exercise for it"  . Mood swings ([place]) 11/08/11    "I go back and forth; real bad"  . Sickle cell anemia [facility])    Past Surgical History  Procedure Laterality Date  . Cholecystectomy  05/2010  . Dilation and curettage of uterus  02/20/11    S/P miscarriage   Social History:  reports that she quit smoking about 1 years ago. Her smoking use included Cigarettes. She has a .25 pack-year smoking history. She has never used smokeless tobacco. She reports that she does not drink alcohol or use illicit drugs.  Allergies  Allergen Reactions  . Carrot [Daucus Carota] Hives, Swelling and Rash    Dietary:  Please do not send any form of carrot to patient  . Carrot Oil Hives and Swelling  . Latex Rash  Family History  Problem Relation Age of Onset  . Diabetes Maternal Grandmother   . Diabetes Paternal Grandmother   . Hypertension Paternal Grandmother   . Diabetes Maternal Grandfather   . Sickle cell trait Mother   . Sickle cell trait Father      Prior to Admission medications   Medication Sig Start Date End Date Taking? Authorizing Provider  hydroxyurea (HYDREA) 500 MG capsule Take 1 capsule (500 mg total)  by mouth daily. May take with food to minimize GI side effects. 02/19/15  Yes Micheline Chapman, NP  methadone (DOLOPHINE) 5 MG tablet Take 1 tablet (5 mg total) by mouth at bedtime. 02/23/15  Yes Micheline Chapman, NP  oxyCODONE (ROXICODONE) 15 MG immediate release tablet Take 1 tablet (15 mg total) by mouth every 4 (four) hours as needed for pain. 03/09/15  Yes Micheline Chapman, NP  folic acid (FOLVITE) 1 MG tablet Take 2 tablets (2 mg total) by mouth daily. Patient not taking: Reported on 03/22/2015 02/23/15   Micheline Chapman, NP  ibuprofen (ADVIL,MOTRIN) 800 MG tablet Take 1 tablet (800 mg total) by mouth every 8 (eight) hours as needed. Patient not taking: Reported on 03/22/2015 12/10/14   Caren Macadam, MD   Physical Exam: Filed Vitals:   03/22/15 1904 03/22/15 1930 03/22/15 2000 03/22/15 2030  BP: 108/45 105/54 95/36 99/61   Pulse: 87 100 90 85  Temp:      TempSrc:      Resp: 14 23 18 17   Height:      Weight:      SpO2:  96% 100% 98%    Wt Readings from Last 3 Encounters:  03/22/15 79.379 kg (175 lb)  02/19/15 79.833 kg (176 lb)  01/25/15 74.844 kg (165 lb)    General:  Appears calm and comfortable Eyes: PERRL, normal lids, irises & conjunctiva ENT: grossly normal hearing, lips & tongue Neck: no LAD, masses or thyromegaly Cardiovascular: RRR, no m/r/g. No LE edema. Telemetry: SR, no arrhythmias  Respiratory: CTA bilaterally, no w/r/r. Normal respiratory effort. Abdomen: soft, ntnd Skin: no rash or induration seen on limited exam Musculoskeletal: grossly normal tone BUE/BLE, positive paraspinal muscle tenderness at the lumbar sacral level. Psychiatric: grossly normal mood and affect, speech fluent and appropriate Neurologic: grossly non-focal.          Labs on Admission:  Basic Metabolic Panel:  Recent Labs Lab 03/22/15 1351  NA 134*  K 5.0  CL 104  CO2 22  GLUCOSE 88  BUN 7  CREATININE 0.51  CALCIUM 9.5   Liver Function Tests:  Recent Labs Lab  03/22/15 1351  AST 42*  ALT 16  ALKPHOS 52  BILITOT 2.1*  PROT 8.0  ALBUMIN 4.7   CBC:  Recent Labs Lab 03/22/15 1351  WBC 14.6*  NEUTROABS 9.6*  HGB 9.6*  HCT 27.6*  MCV 91.7  PLT 555*      Assessment/Plan Principal Problem:   Sickle cell crisis ([name])   Sickle cell pain crisis ([place]) Admit to MedSurg. Continue IV hydration. Pain management with PCA. Continue hydroxyurea and folic acid. Monitor hematocrit and hemoglobin.  Active Problems:   Depression No reported treatment at this time No depressive symptoms at this time.     Leukocytosis  monitor WBC. Patient denies fever, respiratory, GI or urinary symptoms.    Thrombocytosis. Continue IV hydration and DVT prophylaxis. Monitor platelet levels.    Pregnancy at early stage  Declined ultrasound at this time.  The patient stated that  she wishes to terminate the pregnancy.     Code Status: Full code. DVT Prophylaxis: F Lovenox SQ.amily Communication:  Disposition Plan: Admit for pain management.  Time spent: Over 70 minutes were spent during the process of this admission.  Reubin Milan [facility] Pager [phone].

## 2015-03-22 NOTE — ED Notes (Signed)
Pt now requesting benadryl.  Informed will make request to MD.  Pt verbalized understanding.

## 2015-03-23 ENCOUNTER — Encounter (HOSPITAL_COMMUNITY): Payer: Self-pay

## 2015-03-23 ENCOUNTER — Telehealth: Payer: Self-pay

## 2015-03-23 DIAGNOSIS — Z331 Pregnant state, incidental: Secondary | ICD-10-CM

## 2015-03-23 DIAGNOSIS — D638 Anemia in other chronic diseases classified elsewhere: Secondary | ICD-10-CM

## 2015-03-23 LAB — CBC WITH DIFFERENTIAL/PLATELET
BASOS ABS: 0 10*3/uL (ref 0.0–0.1)
BASOS ABS: 0.1 10*3/uL (ref 0.0–0.1)
BASOS ABS: 0.1 10*3/uL (ref 0.0–0.1)
BASOS PCT: 0 %
BASOS PCT: 0 %
BASOS PCT: 0 %
Basophils Absolute: 0.1 10*3/uL (ref 0.0–0.1)
Basophils Relative: 0 %
EOS ABS: 0.5 10*3/uL (ref 0.0–0.7)
EOS PCT: 5 %
Eosinophils Absolute: 0.5 10*3/uL (ref 0.0–0.7)
Eosinophils Absolute: 0.6 10*3/uL (ref 0.0–0.7)
Eosinophils Absolute: 0.6 10*3/uL (ref 0.0–0.7)
Eosinophils Relative: 4 %
Eosinophils Relative: 4 %
Eosinophils Relative: 4 %
HCT: 22.8 % — ABNORMAL LOW (ref 36.0–46.0)
HEMATOCRIT: 24 % — AB (ref 36.0–46.0)
HEMATOCRIT: 23.2 % — AB (ref 36.0–46.0)
HEMATOCRIT: 20.9 % — AB (ref 36.0–46.0)
HEMOGLOBIN: 7.5 g/dL — AB (ref 12.0–15.0)
Hemoglobin: 8.4 g/dL — ABNORMAL LOW (ref 12.0–15.0)
Hemoglobin: 7.9 g/dL — ABNORMAL LOW (ref 12.0–15.0)
Hemoglobin: 8.1 g/dL — ABNORMAL LOW (ref 12.0–15.0)
LYMPHS PCT: 23 %
LYMPHS PCT: 18 %
LYMPHS PCT: 20 %
Lymphocytes Relative: 24 %
Lymphs Abs: 2.8 10*3/uL (ref 0.7–4.0)
Lymphs Abs: 2.8 10*3/uL (ref 0.7–4.0)
Lymphs Abs: 2.7 10*3/uL (ref 0.7–4.0)
Lymphs Abs: 2.9 10*3/uL (ref 0.7–4.0)
MCH: 31.7 pg (ref 26.0–34.0)
MCH: 31.5 pg (ref 26.0–34.0)
MCH: 31.6 pg (ref 26.0–34.0)
MCH: 32.1 pg (ref 26.0–34.0)
MCHC: 35 g/dL (ref 30.0–36.0)
MCHC: 34.6 g/dL (ref 30.0–36.0)
MCHC: 34.9 g/dL (ref 30.0–36.0)
MCHC: 35.9 g/dL (ref 30.0–36.0)
MCV: 90.6 fL (ref 78.0–100.0)
MCV: 90.8 fL (ref 78.0–100.0)
MCV: 90.6 fL (ref 78.0–100.0)
MCV: 89.3 fL (ref 78.0–100.0)
MONO ABS: 1.4 10*3/uL — AB (ref 0.1–1.0)
MONO ABS: 1.2 10*3/uL — AB (ref 0.1–1.0)
MONOS PCT: 11 %
MONOS PCT: 8 %
Monocytes Absolute: 1.4 10*3/uL — ABNORMAL HIGH (ref 0.1–1.0)
Monocytes Absolute: 1.5 10*3/uL — ABNORMAL HIGH (ref 0.1–1.0)
Monocytes Relative: 12 %
Monocytes Relative: 10 %
NEUTROS ABS: 7.4 10*3/uL (ref 1.7–7.7)
NEUTROS ABS: 9.6 10*3/uL — AB (ref 1.7–7.7)
NEUTROS ABS: 9.9 10*3/uL — AB (ref 1.7–7.7)
NEUTROS PCT: 68 %
Neutro Abs: 6.9 10*3/uL (ref 1.7–7.7)
Neutrophils Relative %: 62 %
Neutrophils Relative %: 59 %
Neutrophils Relative %: 68 %
PLATELETS: 297 10*3/uL (ref 150–400)
PLATELETS: 550 10*3/uL — AB (ref 150–400)
Platelets: 516 10*3/uL — ABNORMAL HIGH (ref 150–400)
Platelets: 428 10*3/uL — ABNORMAL HIGH (ref 150–400)
RBC: 2.65 MIL/uL — ABNORMAL LOW (ref 3.87–5.11)
RBC: 2.51 MIL/uL — AB (ref 3.87–5.11)
RBC: 2.56 MIL/uL — AB (ref 3.87–5.11)
RBC: 2.34 MIL/uL — ABNORMAL LOW (ref 3.87–5.11)
RDW: 15.5 % (ref 11.5–15.5)
RDW: 15.7 % — AB (ref 11.5–15.5)
RDW: 15.7 % — ABNORMAL HIGH (ref 11.5–15.5)
RDW: 15.9 % — AB (ref 11.5–15.5)
WBC: 12.1 10*3/uL — ABNORMAL HIGH (ref 4.0–10.5)
WBC: 11.7 10*3/uL — AB (ref 4.0–10.5)
WBC: 14.4 10*3/uL — AB (ref 4.0–10.5)
WBC: 14.8 10*3/uL — ABNORMAL HIGH (ref 4.0–10.5)

## 2015-03-23 LAB — COMPREHENSIVE METABOLIC PANEL
ALK PHOS: 46 U/L (ref 38–126)
ALT: 11 U/L — AB (ref 14–54)
AST: 19 U/L (ref 15–41)
Albumin: 4 g/dL (ref 3.5–5.0)
Anion gap: 7 (ref 5–15)
BILIRUBIN TOTAL: 2.3 mg/dL — AB (ref 0.3–1.2)
BUN: 9 mg/dL (ref 6–20)
CALCIUM: 8.8 mg/dL — AB (ref 8.9–10.3)
CO2: 22 mmol/L (ref 22–32)
CREATININE: 0.56 mg/dL (ref 0.44–1.00)
Chloride: 107 mmol/L (ref 101–111)
GFR calc Af Amer: >60 mL/min (ref >60)
Glucose, Bld: 104 mg/dL — ABNORMAL HIGH (ref 65–99)
POTASSIUM: 4.1 mmol/L (ref 3.5–5.1)
Sodium: 136 mmol/L (ref 135–145)
TOTAL PROTEIN: 6.8 g/dL (ref 6.5–8.1)

## 2015-03-23 MED ORDER — KETOROLAC TROMETHAMINE 30 MG/ML IJ SOLN: 30.0000 mg | Freq: Four times a day (QID) | INTRAMUSCULAR | Status: DC

## 2015-03-23 MED ORDER — HYDROMORPHONE HCL 2 MG/ML IJ SOLN
2.0000 mg | INTRAMUSCULAR | Status: DC | PRN
  Administered 2015-03-23 – 2015-03-25 (×10): 2 mg via INTRAVENOUS
  Filled 2015-03-23 (×6): qty 1

## 2015-03-23 MED ORDER — METHADONE HCL 5 MG PO TABS
5.0000 mg | ORAL_TABLET | Freq: Every day | ORAL | Status: DC
  Administered 2015-03-23 – 2015-03-25 (×3): 5 mg via ORAL
  Filled 2015-03-23 (×3): qty 1

## 2015-03-23 MED ORDER — SODIUM CHLORIDE 0.9 % IJ SOLN: 9.0000 mL | INTRAMUSCULAR | Status: DC | PRN

## 2015-03-23 MED ORDER — HYDROMORPHONE HCL 2 MG/ML IJ SOLN
2.0000 mg | Freq: Once | INTRAMUSCULAR | Status: AC
  Administered 2015-03-23: 2 mg via INTRAVENOUS
  Filled 2015-03-23: qty 1

## 2015-03-23 MED ORDER — HYDROMORPHONE HCL 1 MG/ML IJ SOLN
1.0000 mg | INTRAMUSCULAR | Status: AC
Start: 2015-03-23 — End: 2015-03-23
  Administered 2015-03-23 (×4): 1 mg via INTRAVENOUS
  Filled 2015-03-23 (×4): qty 1

## 2015-03-23 MED ORDER — NALOXONE HCL 0.4 MG/ML IJ SOLN: 0.4000 mg | INTRAMUSCULAR | Status: DC | PRN

## 2015-03-23 MED ORDER — ONDANSETRON HCL 4 MG/2ML IJ SOLN
4.0000 mg | Freq: Four times a day (QID) | INTRAMUSCULAR | Status: DC | PRN
  Administered 2015-03-26: 4 mg via INTRAVENOUS
  Filled 2015-03-23: qty 2

## 2015-03-23 MED ORDER — SODIUM CHLORIDE 0.9 % IV SOLN
25.0000 mg | INTRAVENOUS | Status: DC | PRN
  Administered 2015-03-23 – 2015-03-25 (×4): 25 mg via INTRAVENOUS
  Filled 2015-03-23 (×8): qty 0.5

## 2015-03-23 MED ORDER — HYDROMORPHONE 1 MG/ML IV SOLN
INTRAVENOUS | Status: DC
  Administered 2015-03-23: 9 mg via INTRAVENOUS
  Administered 2015-03-23: 10:00:00 via INTRAVENOUS
  Administered 2015-03-24: 6.9 mg via INTRAVENOUS
  Administered 2015-03-24: 2.4 mg via INTRAVENOUS
  Administered 2015-03-24: 3.6 mg via INTRAVENOUS
  Administered 2015-03-24: 4.8 mg via INTRAVENOUS
  Administered 2015-03-24: 10:00:00 via INTRAVENOUS
  Administered 2015-03-24: 8 mg via INTRAVENOUS
  Administered 2015-03-24: 4.8 mg via INTRAVENOUS
  Administered 2015-03-25: 9.8 mg via INTRAVENOUS
  Administered 2015-03-25: 01:00:00 via INTRAVENOUS
  Administered 2015-03-25: 4.8 mg via INTRAVENOUS
  Administered 2015-03-25: 6 mg via INTRAVENOUS
  Administered 2015-03-25: 20:00:00 via INTRAVENOUS
  Administered 2015-03-25: 2.4 mg via INTRAVENOUS
  Administered 2015-03-25: 1.2 mg via INTRAVENOUS
  Administered 2015-03-26: 1.5 mg via INTRAVENOUS
  Administered 2015-03-26: 4.2 mg via INTRAVENOUS
  Filled 2015-03-23 (×5): qty 25

## 2015-03-23 NOTE — Progress Notes (Signed)
24 hour PCA use  Total: 4.5 mg 17 Demands 15 Delivered

## 2015-03-23 NOTE — Progress Notes (Addendum)
   03/23/15 1606  What Happened  Was fall witnessed? No  Was patient injured? No  Patient found in bathroom  Found by Staff-comment (RN, Nancy Fetter )  Stated prior activity bathroom-unassisted  Follow Up  MD notified Zigmund Daniel  Time MD notified [phone]  Family notified Yes-comment (Per patient)  Simple treatment Ice  Progress note created (see row info) Yes  Adult Fall Risk Assessment  Risk Factor Category (scoring not indicated) Fall has occurred during this admission (document High fall risk)  Patient's Fall Risk High Fall Risk (>13 points)  Adult Fall Risk Interventions  Required Bundle Interventions *See Row Information* High fall risk - low, moderate, and high requirements implemented  Additional Interventions Bed alarm not indicated with the bundle  Fall with Injury Screening  Risk For Fall Injury- See Row Information  Nurse judgement  Pain Assessment  Pain Assessment 0-10  Pain Score 8  Pain Type Acute pain  Pain Location Leg  Pain Orientation Right;Upper  Pain Descriptors / Indicators Cramping  Pain Intervention(s) Medication (See eMAR)  PCA/Epidural/Spinal Assessment  Respiratory Pattern Regular  Neurological  Neuro (WDL) WDL  Musculoskeletal  Musculoskeletal (WDL) WDL  Assistive Device None  Integumentary  Integumentary (WDL) WDL  Pain Assessment  Date Pain First Started 03/23/15  Result of Injury Yes  Pain Screening  Clinical Progression Not changed  Patient received education from primary RN regarding the need to call before trying to ambulate out of bathroom. Patient did in fact use call light when finished but did not wait until nurse was in room to help with ambulation. Patient found lying on bathroom floor. Patient states that that she fell because he foot was twisted in the IV tubing and "I am so sorry I fell, it is my fault. I should have waited until you got here to stand up. I know that you guys get in trouble when patients fall. I know to ask for help".  Patient received education from primary RN and NT regarding the need to call before trying to ambulate. Patient now understands that she is considered a high fall risk and verbalizes understanding to ask for help with ambulation. MD and leadership notified. Will continue with plan of care.

## 2015-03-23 NOTE — Progress Notes (Signed)
SICKLE CELL SERVICE PROGRESS NOTE  [name] PYP:[id number] DOB: January 15, 1991 DOA: 03/22/2015 PCP: Angelica Chessman, MD  Assessment/Plan: Principal Problem:   Sickle cell crisis ([place]) Active Problems:   Depression   Sickle cell pain crisis ([place])   Leukocytosis   Pregnancy at early stage  1. Hb SS with crisis: Pt reports pain localized to back and at intensity of 8/10. She has been on a full dose PCA which has rendered inadequate effect for the management of pain in this Opiate tolerant patient. Will increase to a weight based PCA. Also add clinician assisited doses and add Methadone which she takes on a chronic basis. Toradol is on hold as patient was found to be pregnant and due to teratogenic effects, Toradol is contraindicated.  2. Pregnancy at early stage: Pt expresses that she wants to terminate the pregnancy. She is just recently post-partum and this is an unplanned pregnancy. 3. Anemia of chronic disease: Pt at baseline Hb. 4. Chronic pain: Continue Methadone 5 mg HS.  Code Status: Full Code Family Communication: N/A Disposition Plan: Not yet ready for discharge  [place].  Pager [phone]. If 7PM-7AM, please contact night-coverage.  03/23/2015, 10:36 AM  LOS: 1 day    Consultants:  none  Procedures:  None  Antibiotics:  None  HPI/Subjective: Pt reports pain localized to back and at intensity of 8/10.   Objective: Filed Vitals:   03/23/15 0441 03/23/15 0513 03/23/15 0800 03/23/15 1003  BP:  101/46    Pulse:  71    Temp:  98.4 F (36.9 C)    TempSrc:  Oral    Resp: 18 17 16 13   Height:      Weight:      SpO2: 100% 99% 100% 99%   Weight change:   Intake/Output Summary (Last 24 hours) at 03/23/15 1036 Last data filed at 03/23/15 0845  Gross per 24 hour  Intake    720 ml  Output      0 ml  Net    720 ml    General: Alert, awake, oriented x3, in moderate distress.  HEENT: [place]/AT PEERL, EOMI, anciteric OROPHARYNX:  Moist, No exudate/  erythema/lesions.  Heart: Regular rate and rhythm, without murmurs, rubs, gallops.  Lungs: Clear to auscultation, no wheezing or rhonchi noted.  Abdomen: Soft, nontender, nondistended, positive bowel sounds, no masses no hepatosplenomegaly noted.  Neuro: No focal neurological deficits noted cranial nerves II through XII grossly intact.  Strength at functional baseline in bilateral upper and lower extremities. Musculoskeletal: No warm swelling or erythema around joints, no spinal tenderness noted. Psychiatric: Patient alert and oriented x3, good insight and cognition, good recent to remote recall.    Data Reviewed: Basic Metabolic Panel:  Recent Labs Lab 03/22/15 1351 03/22/15 2255 03/23/15 0545  NA 134*  --  136  K 5.0  --  4.1  CL 104  --  107  CO2 22  --  22  GLUCOSE 88  --  104*  BUN 7  --  9  CREATININE 0.51  --  0.56  CALCIUM 9.5  --  8.8*  MG  --  2.0  --   PHOS  --  4.2  --    Liver Function Tests:  Recent Labs Lab 03/22/15 1351 03/23/15 0545  AST 42* 19  ALT 16 11*  ALKPHOS 52 46  BILITOT 2.1* 2.3*  PROT 8.0 6.8  ALBUMIN 4.7 4.0   No results for input(s): LIPASE, AMYLASE in the last 168 hours. No  results for input(s): AMMONIA in the last 168 hours. CBC:  Recent Labs Lab 03/22/15 1351 03/22/15 2043 03/22/15 2255 03/23/15 9798  WBC 92.1* DUPLICATE SEE [id number] @ 0814 12.8* 12.1*  NEUTROABS 9.6* PENDING 7.9* 7.4  HGB 9.6* DUPLICATE SEE [id number] @ 3149 8.4* 8.4*  HCT 70.2* DUPLICATE SEE [id number] @ 8502 24.6* 77.4*  MCV 12.8 DUPLICATE SEE [id number] @ 2094 90.4 70.9  PLT 628* DUPLICATE SEE [id number] @ 7654 477* 516*   Cardiac Enzymes: No results for input(s): CKTOTAL, CKMB, CKMBINDEX, TROPONINI in the last 168 hours. BNP (last 3 results) No results for input(s): BNP in the last 8760 hours.  ProBNP (last 3 results) No results for input(s): PROBNP in the last 8760 hours.  CBG: No results for input(s): GLUCAP in the last 168 hours.  No results found for this  or any previous visit (from the past 240 hour(s)).   Studies: No results found.  Scheduled Meds: . enoxaparin (LOVENOX) injection  40 mg Subcutaneous Q24H  . folic acid  1 mg Oral Daily  . HYDROmorphone   Intravenous 6 times per day  .  HYDROmorphone (DILAUDID) injection  1 mg Intravenous Q2H  . hydroxyurea  500 mg Oral Daily  . methadone  5 mg Oral QHS  . senna-docusate  1 tablet Oral BID   Continuous Infusions: . dextrose 5 % and 0.45% NaCl 125 mL/hr at 03/22/15 2156    Time spent 40 minutes

## 2015-03-23 NOTE — Telephone Encounter (Signed)
REFILL REQUEST FOR OXYCODONE 15MG  AND METHADONE 5MG . PATIENT IS IN PATIENT RIGHT NOW AND IS NOT GOING TO BE OUT IN TIME FOR APPOINTMENT ON 03/24/2015 @11AM . THIS IS BEING RESCHEDULED. PLEASE ADVISE. THANKS!

## 2015-03-24 ENCOUNTER — Other Ambulatory Visit: Payer: Self-pay | Admitting: *Deleted

## 2015-03-24 ENCOUNTER — Ambulatory Visit: Payer: Medicare Other | Admitting: Internal Medicine

## 2015-03-24 DIAGNOSIS — D72829 Elevated white blood cell count, unspecified: Secondary | ICD-10-CM

## 2015-03-24 DIAGNOSIS — W19XXXA Unspecified fall, initial encounter: Secondary | ICD-10-CM

## 2015-03-24 DIAGNOSIS — G8929 Other chronic pain: Secondary | ICD-10-CM

## 2015-03-24 LAB — CBC WITH DIFFERENTIAL/PLATELET
BASOS PCT: 0 %
Basophils Absolute: 0 10*3/uL (ref 0.0–0.1)
Eosinophils Absolute: 0.7 10*3/uL (ref 0.0–0.7)
Eosinophils Relative: 5 %
HEMATOCRIT: 23 % — AB (ref 36.0–46.0)
HEMOGLOBIN: 8.1 g/dL — AB (ref 12.0–15.0)
LYMPHS ABS: 3.3 10*3/uL (ref 0.7–4.0)
Lymphocytes Relative: 25 %
MCH: 31.6 pg (ref 26.0–34.0)
MCHC: 35.2 g/dL (ref 30.0–36.0)
MCV: 89.8 fL (ref 78.0–100.0)
MONOS PCT: 9 %
Monocytes Absolute: 1.1 10*3/uL — ABNORMAL HIGH (ref 0.1–1.0)
NEUTROS ABS: 8.2 10*3/uL — AB (ref 1.7–7.7)
NEUTROS PCT: 61 %
Platelets: 468 10*3/uL — ABNORMAL HIGH (ref 150–400)
RBC: 2.56 MIL/uL — ABNORMAL LOW (ref 3.87–5.11)
RDW: 15.7 % — ABNORMAL HIGH (ref 11.5–15.5)
WBC: 13.4 10*3/uL — ABNORMAL HIGH (ref 4.0–10.5)

## 2015-03-24 MED ORDER — METHADONE HCL 5 MG PO TABS: 5.0000 mg | ORAL_TABLET | Freq: Every day | ORAL | Status: DC

## 2015-03-24 MED ORDER — OXYCODONE HCL 15 MG PO TABS: 15.0000 mg | ORAL_TABLET | ORAL | Status: DC | PRN

## 2015-03-24 NOTE — Progress Notes (Signed)
SICKLE CELL SERVICE PROGRESS NOTE  [name] MEQ:[id number] DOB: 02/23/1991 DOA: 03/22/2015 PCP: Angelica Chessman, MD  Assessment/Plan: Principal Problem:   Sickle cell crisis ([name]) Active Problems:   Depression   Sickle cell pain crisis ([place])   Leukocytosis   Pregnancy at early stage  1. Hb SS with crisis: Pt reports pain localized to back and at intensity of 6-7/10. She has used 24.9 mg with 43/42:demands/deliveries in the last 24 hours. . Will continue PCA at current dose and continue clinician assisited doses. Also continue Methadone which she takes on a chronic basis. Toradol is on hold as patient was found to be pregnant and due to teratogenic effects, Toradol is contraindicated.  2. S/P fall: Pt had a fall while in th bathroom yesterday. She has no outward sequela of the fall and reports that the pain is minimal in the area of impact today. 3. Pregnancy at early stage: Pt expresses that she wants to terminate the pregnancy. She is just recently post-partum and this is an unplanned pregnancy. 4. Anemia of chronic disease: Pt at baseline Hb. 5. Chronic pain: Continue Methadone 5 mg HS.  Code Status: Full Code Family Communication: N/A Disposition Plan: Not yet ready for discharge  [place].  Pager [phone]. If 7PM-7AM, please contact night-coverage.  03/24/2015, 4:41 PM  LOS: 2 days    Consultants:  none  Procedures:  None  Antibiotics:  None  HPI/Subjective: Pt reports pain localized to back and at intensity of 7-8/10.   Objective: Filed Vitals:   03/24/15 1000 03/24/15 1200 03/24/15 1500 03/24/15 1600  BP: 113/67  108/57   Pulse: 104  98   Temp: 98 F (36.7 C)  98.8 F (37.1 C)   TempSrc: Oral  Oral   Resp: 12 17 14 14   Height:      Weight:      SpO2: 97% 96% 100% 96%   Weight change:   Intake/Output Summary (Last 24 hours) at 03/24/15 1641 Last data filed at 03/24/15 1400  Gross per 24 hour  Intake    720 ml  Output      0 ml   Net    720 ml    General: Alert, awake, oriented x3, in moderate distress.  HEENT: [place]/AT PEERL, EOMI, anciteric OROPHARYNX:  Moist, No exudate/ erythema/lesions.  Heart: Regular rate and rhythm, without murmurs, rubs, gallops.  Lungs: Clear to auscultation, no wheezing or rhonchi noted.  Abdomen: Soft, nontender, nondistended, positive bowel sounds, no masses no hepatosplenomegaly noted.  Neuro: No focal neurological deficits noted cranial nerves II through XII grossly intact.  Strength at functional baseline in bilateral upper and lower extremities. Musculoskeletal: No warm swelling or erythema around joints, no spinal tenderness noted. No RUE of RLE tenderness. Gait normal Psychiatric: Patient alert and oriented x3, good insight and cognition, good recent to remote recall.    Data Reviewed: Basic Metabolic Panel:  Recent Labs Lab 03/22/15 1351 03/22/15 2255 03/23/15 0545  NA 134*  --  136  K 5.0  --  4.1  CL 104  --  107  CO2 22  --  22  GLUCOSE 88  --  104*  BUN 7  --  9  CREATININE 0.51  --  0.56  CALCIUM 9.5  --  8.8*  MG  --  2.0  --   PHOS  --  4.2  --    Liver Function Tests:  Recent Labs Lab 03/22/15 1351 03/23/15 0545  AST 42* 19  ALT  16 11*  ALKPHOS 52 46  BILITOT 2.1* 2.3*  PROT 8.0 6.8  ALBUMIN 4.7 4.0   No results for input(s): LIPASE, AMYLASE in the last 168 hours. No results for input(s): AMMONIA in the last 168 hours. CBC:  Recent Labs Lab 03/23/15 0545 03/23/15 1107 03/23/15 1731 03/23/15 2320 03/24/15 0405  WBC 12.1* 11.7* 14.4* 14.8* 13.4*  NEUTROABS 7.4 6.9 9.6* 9.9* 8.2*  HGB 8.4* 7.9* 8.1* 7.5* 8.1*  HCT 24.0* 22.8* 23.2* 20.9* 23.0*  MCV 90.6 90.8 90.6 89.3 89.8  PLT 516* 297 550* 428* 468*   Cardiac Enzymes: No results for input(s): CKTOTAL, CKMB, CKMBINDEX, TROPONINI in the last 168 hours. BNP (last 3 results) No results for input(s): BNP in the last 8760 hours.  ProBNP (last 3 results) No results for input(s): PROBNP  in the last 8760 hours.  CBG: No results for input(s): GLUCAP in the last 168 hours.  No results found for this or any previous visit (from the past 240 hour(s)).   Studies: No results found.  Scheduled Meds: . enoxaparin (LOVENOX) injection  40 mg Subcutaneous Q24H  . folic acid  1 mg Oral Daily  . HYDROmorphone   Intravenous 6 times per day  . hydroxyurea  500 mg Oral Daily  . methadone  5 mg Oral QHS  . senna-docusate  1 tablet Oral BID   Continuous Infusions: . dextrose 5 % and 0.45% NaCl 10 mL/hr at 03/24/15 1049    Time spent 30 minutes

## 2015-03-25 DIAGNOSIS — K5909 Other constipation: Secondary | ICD-10-CM

## 2015-03-25 MED ORDER — OXYCODONE HCL 5 MG PO TABS
15.0000 mg | ORAL_TABLET | ORAL | Status: DC
Start: 2015-03-25 — End: 2015-03-26
  Administered 2015-03-25 – 2015-03-26 (×7): 15 mg via ORAL
  Filled 2015-03-25 (×7): qty 3

## 2015-03-25 NOTE — Care Management Important Message (Signed)
Important Message  Patient Details  Name: QUETZAL MEANY MRN: [id number] Date of Birth: 1990/10/04   Medicare Important Message Given:  Yes-second notification given    Camillo Flaming 03/25/2015, 1:24 [place] Message  Patient Details  Name: HULA TASSO MRN: [id number] Date of Birth: 1991/01/27   Medicare Important Message Given:  Yes-second notification given    Camillo Flaming 03/25/2015, 1:23 PM

## 2015-03-25 NOTE — Progress Notes (Signed)
SICKLE CELL SERVICE PROGRESS NOTE  [name] [id number] DOB: [birth year] DOA: 03/22/2015 PCP: Angelica Chessman, MD  Assessment/Plan: Principal Problem:   Sickle cell crisis ([place]) Active Problems:   Depression   Sickle cell pain crisis ([name])   Leukocytosis   Pregnancy at early stage  1. Hb SS with crisis: Pt reports pain localized to back and at intensity of 6-7/10. She has used 34.6 mg with 45/41:demands/deliveries in the last 24 hours. Will schedule oxycodone and continue PCA at current dose for PRN use. Discontinue clinician assisited doses. Also continue Methadone which she takes on a chronic basis. Toradol is on hold as patient was found to be pregnant and due to teratogenic effects, Toradol is contraindicated.  2. S/P fall: Pt had a fall while in th bathroom 2 days ago. She has no outward sequela of the fall and reports that the pain is minimal in the area of impact today. 3. Pregnancy at early stage: Pt expresses that she wants to terminate the pregnancy. She is just recently post-partum and this is an unplanned pregnancy. 4. Anemia of chronic disease: Pt at baseline Hb. 5. Chronic pain: Continue Methadone 5 mg HS.  Code Status: Full Code Family Communication: N/A Disposition Plan: Anticipate discharge tomorrow.  [name],Nancy A.  Pager [phone]. If 7PM-7AM, please contact night-coverage.  03/25/2015, 10:39 AM  LOS: 3 days    Consultants:  none  Procedures:  None  Antibiotics:  None  HPI/Subjective: Pt reports pain localized to back and at intensity of 6-7/10.   Objective: Filed Vitals:   03/24/15 2135 03/25/15 0000 03/25/15 0210 03/25/15 0400  BP: 114/68  100/46   Pulse: 101  88   Temp: 98.2 F (36.8 C)  98.3 F (36.8 C)   TempSrc: Oral  Oral   Resp: 15 13 14 12   Height:      Weight:      SpO2: 99% 99% 100% 100%   Weight change:   Intake/Output Summary (Last 24 hours) at 03/25/15 1039 Last data filed at 03/24/15 1800  Gross per 24  hour  Intake    480 ml  Output      0 ml  Net    480 ml    General: Alert, awake, oriented x3, in mild distress.  HEENT: [place]/AT PEERL, EOMI, anciteric OROPHARYNX:  Moist, No exudate/ erythema/lesions.  Heart: Regular rate and rhythm, without murmurs, rubs, gallops.  Lungs: Clear to auscultation, no wheezing or rhonchi noted.  Abdomen: Soft, nontender, nondistended, positive bowel sounds, no masses no hepatosplenomegaly noted.  Neuro: No focal neurological deficits noted cranial nerves II through XII grossly intact.  Strength at functional baseline in bilateral upper and lower extremities. Musculoskeletal: No warm swelling or erythema around joints, no spinal tenderness noted. No RUE of RLE tenderness. Gait normal Psychiatric: Patient alert and oriented x3, good insight and cognition, good recent to remote recall.    Data Reviewed: Basic Metabolic Panel:  Recent Labs Lab 03/22/15 1351 03/22/15 2255 03/23/15 0545  NA 134*  --  136  K 5.0  --  4.1  CL 104  --  107  CO2 22  --  22  GLUCOSE 88  --  104*  BUN 7  --  9  CREATININE 0.51  --  0.56  CALCIUM 9.5  --  8.8*  MG  --  2.0  --   PHOS  --  4.2  --    Liver Function Tests:  Recent Labs Lab 03/22/15 1351 03/23/15 0545  AST  42* 19  ALT 16 11*  ALKPHOS 52 46  BILITOT 2.1* 2.3*  PROT 8.0 6.8  ALBUMIN 4.7 4.0   No results for input(s): LIPASE, AMYLASE in the last 168 hours. No results for input(s): AMMONIA in the last 168 hours. CBC:  Recent Labs Lab 03/23/15 0545 03/23/15 1107 03/23/15 1731 03/23/15 2320 03/24/15 0405  WBC 12.1* 11.7* 14.4* 14.8* 13.4*  NEUTROABS 7.4 6.9 9.6* 9.9* 8.2*  HGB 8.4* 7.9* 8.1* 7.5* 8.1*  HCT 24.0* 22.8* 23.2* 20.9* 23.0*  MCV 90.6 90.8 90.6 89.3 89.8  PLT 516* 297 550* 428* 468*   Cardiac Enzymes: No results for input(s): CKTOTAL, CKMB, CKMBINDEX, TROPONINI in the last 168 hours. BNP (last 3 results) No results for input(s): BNP in the last 8760 hours.  ProBNP (last 3  results) No results for input(s): PROBNP in the last 8760 hours.  CBG: No results for input(s): GLUCAP in the last 168 hours.  No results found for this or any previous visit (from the past 240 hour(s)).   Studies: No results found.  Scheduled Meds: . enoxaparin (LOVENOX) injection  40 mg Subcutaneous Q24H  . folic acid  1 mg Oral Daily  . HYDROmorphone   Intravenous 6 times per day  . hydroxyurea  500 mg Oral Daily  . methadone  5 mg Oral QHS  . oxyCODONE  15 mg Oral Q4H  . senna-docusate  1 tablet Oral BID   Continuous Infusions: . dextrose 5 % and 0.45% NaCl 10 mL/hr at 03/24/15 1049    Time spent 30 minutes

## 2015-03-26 MED ORDER — FOLIC ACID 1 MG PO TABS: 2.0000 mg | ORAL_TABLET | Freq: Every day | ORAL | Status: DC

## 2015-03-26 NOTE — Progress Notes (Signed)
Pt discharged home. VSS. IV d/c with catheter intact.  Discharge instructions given and pt verbalized understanding.  Pt escorted via WC to main entrance to awaiting private vehicle by Cindi Carbon NT

## 2015-03-26 NOTE — Care Management Note (Signed)
Case Management Note  Patient Details  Name: [name] MRN: [id number] Date of Birth: 1990/06/30  Subjective/Objective:             24 yo admitted with [facility]       Action/Plan: From home with family  Expected Discharge Date:   (unsure)               Expected Discharge Plan:  Home/Self Care  In-House Referral:     Discharge planning Services  CM Consult  Post Acute Care Choice:    Choice offered to:     DME Arranged:    DME Agency:     HH Arranged:    [place] Agency:     Status of Service:  Completed, signed off  Medicare Important Message Given:  Yes-second notification given Date Medicare IM Given:    Medicare IM give by:    Date Additional Medicare IM Given:    Additional Medicare Important Message give by:     If discussed at [place] of Stay Meetings, dates discussed:    Additional Comments:  Lynnell Catalan, RN 03/26/2015, 12:29 PM

## 2015-03-26 NOTE — Discharge Summary (Signed)
[name] MRN: [id number] DOB/AGE: 07-14-1990 24 y.o.  Admit date: 03/22/2015 Discharge date: 03/26/2015  Primary Care Physician:  Nancy Chessman, MD   Discharge Diagnoses:   Patient Active Problem List   Diagnosis Date Noted  . Mood swings ([place]) 11/08/2011    Priority: High  . Depression 01/06/2011    Priority: High  . Sickle cell disease ([place]) 01/08/2009    Priority: High  . Anemia of chronic disease 10/05/2012    Priority: Medium  . 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 Leukocytosis 03/22/2015  . Pregnancy at early stage 03/22/2015  . Labor abnormal 12/05/2014  . Sickle cell pain crisis ([place]) 11/26/2014  . Hypokalemia 10/29/2014  . Line sepsis ([place]) 10/27/2014  . Sickle cell crisis ([place]) 10/23/2014  . Hb-SS disease without crisis ([place]) 10/09/2014  . Supervision of high-risk pregnancy 06/11/2014  . Chronic pain 02/26/2013  . Migraines 11/08/2011  . Overweight(278.02) 05/24/2011  . GERD (gastroesophageal reflux disease) 02/17/2011  . Back pain 09/17/2010  . 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     methadone 5 MG tablet  Commonly known as:  DOLOPHINE  Take 1 tablet (5 mg total) by mouth at bedtime.     oxyCODONE 15 MG immediate release tablet  Commonly known as:  ROXICODONE  Take 1 tablet (15 mg total) by mouth every 4 (four) hours as needed for pain.           No results found for this or any previous visit (from the past 240 hour(s)).  BRIEF ADMITTING H & P: [name] is a 24 y.o. female with a past medical history of sickle cell anemia, depression, GERD, migraine  headaches, chronic back pain who comes to the hospital emergency department due to sickle cell pain crisis for the past 4 days which has not been controlled at home with oral medication. She denies fever, chills, but feels fatigue. She denies headache, sore throat, runny nose, productive cough, complaints of nausea, but denies abdominal pain, emesis, diarrhea, melena or hematochezia. She denies GU symptoms as well. Her last menstrual period was at the end of September, but could not specify the exact date.  In the ER, the patient has received analgesics, antiemetics and antihistamine with partial relief. She is currently in no acute distress.Workup revealed the patient is a few weeks pregnant, but declined further evaluation stating that she will terminate the pregnancy due to having significant difficulty with her last pregnancy and 84 hour long delivery per patient.     Hospital Course:  Present on Admission:  . Sickle cell pain crisis [facility]): Pain was managed with IV Dilaudid via PCA, clinician assisted doses and IVF. She could not receive Toradol or any NSAID because of a incidental finding of pregnancy. As her pain decreased she was transitioned to oral analgesics and she is discharged home on Oxycodone and Methadone as prescribed prir to hospitalization. Pt was very anxious to get home to her infant son and despite my advice to remain  for further tapering of medication she felt that she could manage her pain at home. Without any other clinical risk requiring hospitalization, she was discharged home in good condition.  . Early Pregnancy: Pt is newly diagnosed with pregnancy by HCG. She verbalizes that she wishes to terminate the pregnancy. I have advised patient to make an appointment with Ob-Gyn and furth discuss options regarding her pregnancy. SW provided patietn with information for [facility] parenthood as requested by patient. Ibuprofen and Hydrea were discontinued in light of pregnancy  . Anemia  of Chronic Disease: Pt has anemia of chronic disease. However, her Hb remained at baseline throughout hospitalization and no transfusions were required.  . Leukocytosis: Pt had a mild leukocytosis during this hospitalization and this was felt to be secondary to inflammatory process associated with crisis.  Disposition and Follow-up:  Pt is discharged home in good condition and is to follow up with her Primary Care Provider- Dr. Doreene Burke on 03/31/2015.        Discharge Instructions    Activity as tolerated - No restrictions    Complete by:  As directed      Diet general    Complete by:  As directed            DISCHARGE EXAM:  General: Alert, awake, oriented x3, in no apparent distress.  Vital Signs: BP 115/90, HR 83, T 98 F (36.7 C), temperature source Axillary, RR15, height 5\' 1"  (1.549 m), weight 176 lb 12.9 oz (80.2 kg), last menstrual period 01/20/2015, SpO2 96 %, unknown if currently breastfeeding. HEENT: [place]/AT PEERL, EOMI, anicteric Neck: Trachea midline, no masses, no thyromegal,y no JVD, no carotid bruit OROPHARYNX: Moist, No exudate/ erythema/lesions.  Heart: Regular rate and rhythm, without murmurs, rubs, gallops or S3. PMI non-displaced. Exam reveals no decreased pulses. Pulmonary/Chest: Normal effort. Breath sounds normal. No. Apnea. Clear to auscultation,no stridor,  no wheezing and no rhonchi noted. No respiratory distress and no tenderness noted. Abdomen: Soft, nontender, nondistended, normal bowel sounds, no masses no hepatosplenomegaly noted. No fluid wave and no ascites. There is no guarding or rebound. Neuro: Alert and oriented to person, place and time. Normal motor skills, Displays no atrophy or tremors and exhibits normal muscle tone.  No focal neurological deficits noted cranial nerves II through XII grossly intact. No sensory deficit noted. Strength at baseline in bilateral upper and lower extremities. Gait normal. Musculoskeletal: No warm swelling or erythema  around joints, no spinal tenderness noted. Psychiatric: Patient alert and oriented x3, good insight and cognition, good recent to remote recall.  Mood, memory, affect and judgement normal Lymph node survey: No cervical axillary or inguinal lymphadenopathy noted. Skin: Skin is warm and dry. No bruising, no ecchymosis and no rash noted. Pt is not diaphoretic. No erythema. No pallor     Recent Labs  03/23/15 2320 03/24/15 0405  WBC 14.8* 13.4*  NEUTROABS 9.9* 8.2*  HGB 7.5* 8.1*  HCT 20.9* 23.0*  MCV 89.3 89.8  PLT 428* 468*     Total time spent including face to face and decision making was greater than 30 minutes  Signed: Amylynn Fano A. 03/26/2015, 10:19 AM

## 2015-03-30 ENCOUNTER — Inpatient Hospital Stay (HOSPITAL_COMMUNITY)
Admission: AD | Admit: 2015-03-30 | Discharge: 2015-04-03 | DRG: 781 | Disposition: A | Discharge status: Home / Self Care | Payer: Medicare Other | Attending: Internal Medicine | Admitting: Internal Medicine

## 2015-03-30 ENCOUNTER — Emergency Department (HOSPITAL_COMMUNITY)
Admission: EM | Admit: 2015-03-30 | Discharge: 2015-03-30 | Disposition: A | Discharge status: Home / Self Care | Payer: Medicare Other | Source: Home / Self Care | Attending: Emergency Medicine | Admitting: Emergency Medicine

## 2015-03-30 ENCOUNTER — Encounter (HOSPITAL_COMMUNITY): Payer: Self-pay | Admitting: *Deleted

## 2015-03-30 ENCOUNTER — Encounter (HOSPITAL_COMMUNITY): Payer: Self-pay | Admitting: Hematology

## 2015-03-30 DIAGNOSIS — Z3A00 Weeks of gestation of pregnancy not specified: Secondary | ICD-10-CM

## 2015-03-30 DIAGNOSIS — O99011 Anemia complicating pregnancy, first trimester: Secondary | ICD-10-CM | POA: Diagnosis not present

## 2015-03-30 DIAGNOSIS — Z79891 Long term (current) use of opiate analgesic: Secondary | ICD-10-CM | POA: Diagnosis not present

## 2015-03-30 DIAGNOSIS — R52 Pain, unspecified: Secondary | ICD-10-CM | POA: Diagnosis not present

## 2015-03-30 DIAGNOSIS — R600 Localized edema: Secondary | ICD-10-CM

## 2015-03-30 DIAGNOSIS — E871 Hypo-osmolality and hyponatremia: Secondary | ICD-10-CM | POA: Diagnosis present

## 2015-03-30 DIAGNOSIS — D638 Anemia in other chronic diseases classified elsewhere: Secondary | ICD-10-CM | POA: Diagnosis present

## 2015-03-30 DIAGNOSIS — Z79899 Other long term (current) drug therapy: Secondary | ICD-10-CM

## 2015-03-30 DIAGNOSIS — M7989 Other specified soft tissue disorders: Secondary | ICD-10-CM | POA: Diagnosis present

## 2015-03-30 DIAGNOSIS — K5900 Constipation, unspecified: Secondary | ICD-10-CM | POA: Diagnosis present

## 2015-03-30 DIAGNOSIS — Z8249 Family history of ischemic heart disease and other diseases of the circulatory system: Secondary | ICD-10-CM

## 2015-03-30 DIAGNOSIS — D72829 Elevated white blood cell count, unspecified: Secondary | ICD-10-CM | POA: Diagnosis present

## 2015-03-30 DIAGNOSIS — Z833 Family history of diabetes mellitus: Secondary | ICD-10-CM

## 2015-03-30 DIAGNOSIS — D5700 Hb-SS disease with crisis, unspecified: Secondary | ICD-10-CM | POA: Diagnosis not present

## 2015-03-30 DIAGNOSIS — Z9049 Acquired absence of other specified parts of digestive tract: Secondary | ICD-10-CM

## 2015-03-30 DIAGNOSIS — D57819 Other sickle-cell disorders with crisis, unspecified: Secondary | ICD-10-CM

## 2015-03-30 DIAGNOSIS — G8929 Other chronic pain: Secondary | ICD-10-CM | POA: Diagnosis present

## 2015-03-30 DIAGNOSIS — M79604 Pain in right leg: Secondary | ICD-10-CM | POA: Diagnosis not present

## 2015-03-30 DIAGNOSIS — Z331 Pregnant state, incidental: Secondary | ICD-10-CM | POA: Diagnosis not present

## 2015-03-30 DIAGNOSIS — K5909 Other constipation: Secondary | ICD-10-CM | POA: Diagnosis not present

## 2015-03-30 LAB — COMPREHENSIVE METABOLIC PANEL
ALT: 15 U/L (ref 14–54)
AST: 22 U/L (ref 15–41)
Albumin: 4.1 g/dL (ref 3.5–5.0)
Alkaline Phosphatase: 46 U/L (ref 38–126)
Anion gap: 9 (ref 5–15)
BUN: 6 mg/dL (ref 6–20)
CHLORIDE: 106 mmol/L (ref 101–111)
CO2: 22 mmol/L (ref 22–32)
Calcium: 8.9 mg/dL (ref 8.9–10.3)
Creatinine, Ser: 0.5 mg/dL (ref 0.44–1.00)
Glucose, Bld: 95 mg/dL (ref 65–99)
POTASSIUM: 3.8 mmol/L (ref 3.5–5.1)
Sodium: 137 mmol/L (ref 135–145)
Total Bilirubin: 2.2 mg/dL — ABNORMAL HIGH (ref 0.3–1.2)
Total Protein: 7 g/dL (ref 6.5–8.1)

## 2015-03-30 LAB — CBC
HCT: 23.7 % — ABNORMAL LOW (ref 36.0–46.0)
Hemoglobin: 8.3 g/dL — ABNORMAL LOW (ref 12.0–15.0)
MCH: 32.5 pg (ref 26.0–34.0)
MCHC: 35 g/dL (ref 30.0–36.0)
MCV: 92.9 fL (ref 78.0–100.0)
PLATELETS: 558 10*3/uL — AB (ref 150–400)
RBC: 2.55 MIL/uL — ABNORMAL LOW (ref 3.87–5.11)
RDW: 16.9 % — AB (ref 11.5–15.5)
WBC: 15.3 10*3/uL — AB (ref 4.0–10.5)

## 2015-03-30 LAB — CBC WITH DIFFERENTIAL/PLATELET
BASOS ABS: 0 10*3/uL (ref 0.0–0.1)
BASOS PCT: 0 %
EOS ABS: 0.3 10*3/uL (ref 0.0–0.7)
EOS PCT: 2 %
HCT: 21.6 % — ABNORMAL LOW (ref 36.0–46.0)
HEMOGLOBIN: 7.5 g/dL — AB (ref 12.0–15.0)
LYMPHS ABS: 2 10*3/uL (ref 0.7–4.0)
Lymphocytes Relative: 13 %
MCH: 31.9 pg (ref 26.0–34.0)
MCHC: 34.7 g/dL (ref 30.0–36.0)
MCV: 91.9 fL (ref 78.0–100.0)
Monocytes Absolute: 1.5 10*3/uL — ABNORMAL HIGH (ref 0.1–1.0)
Monocytes Relative: 10 %
NEUTROS PCT: 75 %
Neutro Abs: 11.1 10*3/uL — ABNORMAL HIGH (ref 1.7–7.7)
PLATELETS: 535 10*3/uL — AB (ref 150–400)
RBC: 2.35 MIL/uL — AB (ref 3.87–5.11)
RDW: 16.5 % — ABNORMAL HIGH (ref 11.5–15.5)
WBC: 15 10*3/uL — AB (ref 4.0–10.5)

## 2015-03-30 LAB — RETICULOCYTES
RBC.: 2.35 MIL/uL — ABNORMAL LOW (ref 3.87–5.11)
RETIC COUNT ABSOLUTE: 502.9 10*3/uL — AB (ref 19.0–186.0)
RETIC CT PCT: 21.4 % — AB (ref 0.4–3.1)

## 2015-03-30 MED ORDER — HYDROMORPHONE 1 MG/ML IV SOLN
INTRAVENOUS | Status: DC
  Administered 2015-03-30: 11.2 mg via INTRAVENOUS
  Administered 2015-03-30: 7 mg via INTRAVENOUS
  Administered 2015-03-30: 22:00:00 via INTRAVENOUS
  Administered 2015-03-30: 3.5 mg via INTRAVENOUS
  Administered 2015-03-30: 11:00:00 via INTRAVENOUS
  Administered 2015-03-31: 2.75 mg via INTRAVENOUS
  Administered 2015-03-31: 15.5 mg via INTRAVENOUS
  Filled 2015-03-30 (×2): qty 25

## 2015-03-30 MED ORDER — DEXTROSE-NACL 5-0.45 % IV SOLN
INTRAVENOUS | Status: DC
  Administered 2015-03-30 (×2): via INTRAVENOUS
  Administered 2015-04-01: 1000 mL via INTRAVENOUS
  Administered 2015-04-02: 14:00:00 via INTRAVENOUS
  Administered 2015-04-02: 1000 mL via INTRAVENOUS

## 2015-03-30 MED ORDER — HYDROMORPHONE HCL 2 MG/ML IJ SOLN
2.0000 mg | Freq: Once | INTRAMUSCULAR | Status: AC
  Administered 2015-03-30: 2 mg via INTRAVENOUS
  Filled 2015-03-30: qty 1

## 2015-03-30 MED ORDER — DIPHENHYDRAMINE HCL 12.5 MG/5ML PO ELIX
12.5000 mg | ORAL_SOLUTION | Freq: Four times a day (QID) | ORAL | Status: DC | PRN
  Administered 2015-03-30: 12.5 mg via ORAL
  Filled 2015-03-30: qty 5

## 2015-03-30 MED ORDER — KETOROLAC TROMETHAMINE 30 MG/ML IJ SOLN: 30.0000 mg | Freq: Once | INTRAMUSCULAR | Status: DC

## 2015-03-30 MED ORDER — HYDROXYUREA 500 MG PO CAPS: 500.0000 mg | ORAL_CAPSULE | Freq: Every day | ORAL | Status: DC

## 2015-03-30 MED ORDER — HYDROMORPHONE HCL 1 MG/ML IJ SOLN
2.0000 mg | Freq: Once | INTRAMUSCULAR | Status: AC
  Administered 2015-03-30: 2 mg via INTRAVENOUS
  Filled 2015-03-30: qty 2

## 2015-03-30 MED ORDER — ONDANSETRON HCL 4 MG/2ML IJ SOLN
4.0000 mg | Freq: Once | INTRAMUSCULAR | Status: AC
  Administered 2015-03-30: 4 mg via INTRAVENOUS
  Filled 2015-03-30: qty 2

## 2015-03-30 MED ORDER — SODIUM CHLORIDE 0.9 % IV SOLN
12.5000 mg | Freq: Four times a day (QID) | INTRAVENOUS | Status: DC | PRN
  Filled 2015-03-30: qty 0.25

## 2015-03-30 MED ORDER — NALOXONE HCL 0.4 MG/ML IJ SOLN: 0.4000 mg | INTRAMUSCULAR | Status: DC | PRN

## 2015-03-30 MED ORDER — FOLIC ACID 1 MG PO TABS
1.0000 mg | ORAL_TABLET | Freq: Every day | ORAL | Status: DC
  Administered 2015-03-31 – 2015-04-03 (×4): 1 mg via ORAL
  Filled 2015-03-30 (×4): qty 1

## 2015-03-30 MED ORDER — SODIUM CHLORIDE 0.9 % IV BOLUS (SEPSIS)
500.0000 mL | Freq: Once | INTRAVENOUS | Status: AC
  Administered 2015-03-30: 500 mL via INTRAVENOUS

## 2015-03-30 MED ORDER — SENNOSIDES-DOCUSATE SODIUM 8.6-50 MG PO TABS
1.0000 | ORAL_TABLET | Freq: Two times a day (BID) | ORAL | Status: DC
  Administered 2015-03-30 – 2015-04-03 (×8): 1 via ORAL
  Filled 2015-03-30 (×8): qty 1

## 2015-03-30 MED ORDER — SODIUM CHLORIDE 0.9 % IJ SOLN: 9.0000 mL | INTRAMUSCULAR | Status: DC | PRN

## 2015-03-30 MED ORDER — KETOROLAC TROMETHAMINE 30 MG/ML IJ SOLN
30.0000 mg | Freq: Once | INTRAMUSCULAR | Status: AC
  Administered 2015-03-30: 30 mg via INTRAVENOUS
  Filled 2015-03-30: qty 1

## 2015-03-30 MED ORDER — DIPHENHYDRAMINE HCL 50 MG/ML IJ SOLN
25.0000 mg | INTRAMUSCULAR | Status: DC | PRN
  Filled 2015-03-30: qty 0.5

## 2015-03-30 MED ORDER — ONDANSETRON HCL 4 MG/2ML IJ SOLN
4.0000 mg | Freq: Four times a day (QID) | INTRAMUSCULAR | Status: DC | PRN
  Administered 2015-04-01: 4 mg via INTRAVENOUS
  Filled 2015-03-30 (×2): qty 2

## 2015-03-30 MED ORDER — POLYETHYLENE GLYCOL 3350 17 G PO PACK: 17.0000 g | PACK | Freq: Every day | ORAL | Status: DC | PRN

## 2015-03-30 NOTE — H&P (Signed)
[facility] History and Physical  [name] [id number] DOB: 1990/09/23 DOA: 03/30/2015  PCP: Angelica Chessman, MD   Chief Complaint:  Chief Complaint  Patient presents with  . Sickle Cell Pain Crisis    HPI: [name] is a 24 y.o. female with history of sickle cell disease, major depression, GERD, positive pregnancy test confirmed on 03/22/2015, currently nursing 67 month old son but not breastfeeding, was transferred to the day hospital from ED this morning because of intractable pain in her legs especially the right starting from the groin area all the way down preventing proper ambulation. She describes this pain as sharp, similar to her pain crisis, 10 out of 10. No fever. No cough, no chest pain. No urinary symptoms. She was recently hospitalized and managed for sickle cell pain crisis, discharged on 03/26/2015.  Systemic Review: General: The patient denies anorexia, fever, weight loss Cardiac: Denies chest pain, syncope, palpitations, pedal edema  Respiratory: Denies cough, shortness of breath, wheezing GI: Denies severe indigestion/heartburn, abdominal pain, nausea, vomiting, diarrhea and constipation GU: Denies hematuria, incontinence, dysuria  Musculoskeletal: As above  Skin: Denies suspicious skin lesions Neurologic: Denies focal weakness or numbness, change in vision  Past Medical History  Diagnosis Date  . Miscarriage 03/22/2011    Pt reports 2 miscarriages.  . Depression 01/06/2011  . GERD (gastroesophageal reflux disease) 02/17/2011  . Trichotillomania     h/o  . Blood transfusion     "lots"  . Sickle cell anemia with crisis ([place])   . Exertional dyspnea     "sometimes"  . Migraines 11/08/11    "@ least twice/month"  . Chronic back pain     "very severe; have knot in my back; from tight muscle; take RX and exercise for it"  . Mood swings ([place]) 11/08/11    "I go back and forth; real bad"  . Sickle cell anemia [facility])     Past  Surgical History  Procedure Laterality Date  . Cholecystectomy  05/2010  . Dilation and curettage of uterus  02/20/11    S/P miscarriage    Allergies  Allergen Reactions  . Carrot [Daucus Carota] Hives, Swelling and Rash    Dietary:  Please do not send any form of carrot to patient  . Carrot Oil Hives and Swelling  . Latex Rash    *powered Latex*     Family History  Problem Relation Age of Onset  . Diabetes Maternal Grandmother   . Diabetes Paternal Grandmother   . Hypertension Paternal Grandmother   . Diabetes Maternal Grandfather   . Sickle cell trait Mother   . Sickle cell trait Father       Prior to Admission medications   Medication Sig Start Date End Date Taking? Authorizing Provider  hydroxyurea (HYDREA) 500 MG capsule Take 500 mg by mouth daily. May take with food to minimize GI side effects.   Yes Historical Provider, MD  methadone (DOLOPHINE) 5 MG tablet Take 1 tablet (5 mg total) by mouth at bedtime. 03/24/15  Yes Jermany Sundell Essie Christine, MD  oxyCODONE (ROXICODONE) 15 MG immediate release tablet Take 1 tablet (15 mg total) by mouth every 4 (four) hours as needed for pain. 03/24/15  Yes Tresa Garter, MD  folic acid (FOLVITE) 1 MG tablet Take 2 tablets (2 mg total) by mouth daily. 03/26/15   [name], MD     Physical Exam: Filed Vitals:   03/30/15 1027  BP: 108/56  Pulse: 78  Temp:  99.1 F (37.3 C)  TempSrc: Oral  Resp: 18  Height: 5\' 1"  (1.549 m)  Weight: 176 lb (79.833 kg)  SpO2: 100%    General: Alert, awake, afebrile, anicteric, in obvious pain distress, tearful HEENT: Normocephalic and Atraumatic, Mucous membranes pink                PERRLA; EOM intact; No scleral icterus,                 Nares: Patent, Oropharynx: Clear, Fair Dentition                 Neck: FROM, no cervical lymphadenopathy, thyromegaly, carotid bruit or JVD;  CHEST WALL: No tenderness  CHEST: Normal respiration, clear to auscultation bilaterally  HEART: Regular rate  and rhythm; no murmurs rubs or gallops  BACK: No kyphosis or scoliosis; no CVA tenderness  ABDOMEN: Positive Bowel Sounds, soft, non-tender; no masses, no organomegaly EXTREMITIES: No cyanosis, clubbing, or edema, tenderness on right hip joint, right thigh and leg, no redness, no warmth, no swelling. SKIN:  no rash or ulceration  CNS: Alert and Oriented x 4, Nonfocal exam, CN 2-12 intact  Labs on Admission:  Basic Metabolic Panel:  Recent Labs Lab 03/30/15 0726  NA 137  K 3.8  CL 106  CO2 22  GLUCOSE 95  BUN 6  CREATININE 0.50  CALCIUM 8.9   Liver Function Tests:  Recent Labs Lab 03/30/15 0726  AST 22  ALT 15  ALKPHOS 46  BILITOT 2.2*  PROT 7.0  ALBUMIN 4.1   No results for input(s): LIPASE, AMYLASE in the last 168 hours. No results for input(s): AMMONIA in the last 168 hours. CBC:  Recent Labs Lab 03/23/15 1107 03/23/15 1731 03/23/15 2320 03/24/15 0405 03/30/15 0726  WBC 11.7* 14.4* 14.8* 13.4* 15.3*  NEUTROABS 6.9 9.6* 9.9* 8.2*  --   HGB 7.9* 8.1* 7.5* 8.1* 8.3*  HCT 22.8* 23.2* 20.9* 23.0* 23.7*  MCV 90.8 90.6 89.3 89.8 92.9  PLT 297 550* 428* 468* 558*   Cardiac Enzymes: No results for input(s): CKTOTAL, CKMB, CKMBINDEX, TROPONINI in the last 168 hours.  BNP (last 3 results) No results for input(s): BNP in the last 8760 hours.  ProBNP (last 3 results) No results for input(s): PROBNP in the last 8760 hours.  CBG: No results for input(s): GLUCAP in the last 168 hours.   Assessment/Plan Active Problems:   Sickle-cell disease with pain ([place])   Admits to the Day Hospital  IVF D5 .45% Saline @ 125 mls/hour  Weight based Dilaudid PCA started within 30 minutes of admission  IV Toradol   Monitor vitals very closely, Re-evaluate pain scale every hour  Oxygen by [place]  Patient will be re-evaluated for pain in the context of function and relationship to baseline as care progresses.  If no significant relieve from pain (remains above 5/10) will  transfer patient to inpatient services for further evaluation and management  Code Status: Full  Family Communication: None  DVT Prophylaxis: Ambulate as tolerated   Time spent: 79 Minutes  Juanelle Trueheart, MD, MHA, FACP, FAAP, CPE  If 7PM-7AM, please contact night-coverage [email or web address] 03/30/2015, 11:04 AM

## 2015-03-30 NOTE — Progress Notes (Signed)
Report called to Beverlee Nims, RN, [place] for pt transfer; pt alert, oriented; transported by wheelchair with PCA and IV fluids infusing; no complications noted

## 2015-03-30 NOTE — Progress Notes (Signed)
Pt's mother called to discuss care with staff; notified pt that, per HIPAA regulations, I would not speak with her mother without prior consent; pt requests IV push medication in addition to PCA medication; notified pt that I will speak with MD to clarify order request; pt verbally combative and non-compliant with requests; mother calling on telephone and verbally abusive with staff; MD notified

## 2015-03-30 NOTE — ED Notes (Signed)
Pt is asleep on her right side. Pt was aroused to speak to the MD at bedside to discuss pain management.

## 2015-03-30 NOTE — ED Provider Notes (Signed)
CSN: [id number]     Arrival date & time 03/30/15  [id number] History   First MD Initiated Contact with Patient 03/30/15 [phone]     Chief Complaint  Patient presents with  . Sickle Cell Pain Crisis     (Consider location/radiation/quality/duration/timing/severity/associated sxs/prior Treatment) Patient is a 24 y.o. female presenting with sickle cell pain. The history is provided by the patient.  Sickle Cell Pain Crisis Associated symptoms: no chest pain, no fever, no headaches, no shortness of breath, no sore throat and no vomiting   Patient w hx sickle cell disease c/o pain in left leg, diffuse, for the past day. States is same as her prior sickle cell pain/crisis. Pain constant, dull, moderate, severe. No radiation. States hx same pain. Denies injury or fall. No swelling. No skin changes or redness. No fever or chills. States recently hospitalized w sickle cell pain crisis, and notes recent pos preg test, indicating she is scheduled for ab. Denies cough or uri c/o. No chest pain or sob. No abd pain. No nvd. No gu c/o.      Past Medical History  Diagnosis Date  . Miscarriage 03/22/2011    Pt reports 2 miscarriages.  . Depression 01/06/2011  . GERD (gastroesophageal reflux disease) 02/17/2011  . Trichotillomania     h/o  . Blood transfusion     "lots"  . Sickle cell anemia with crisis ([place])   . Exertional dyspnea     "sometimes"  . Migraines 11/08/11    "@ least twice/month"  . Chronic back pain     "very severe; have knot in my back; from tight muscle; take RX and exercise for it"  . Mood swings ([place]) 11/08/11    "I go back and forth; real bad"  . Sickle cell anemia [facility])    Past Surgical History  Procedure Laterality Date  . Cholecystectomy  05/2010  . Dilation and curettage of uterus  02/20/11    S/P miscarriage   Family History  Problem Relation Age of Onset  . Diabetes Maternal Grandmother   . Diabetes Paternal Grandmother   . Hypertension Paternal Grandmother   . Diabetes  Maternal Grandfather   . Sickle cell trait Mother   . Sickle cell trait Father    Social History  Substance Use Topics  . Smoking status: Former Smoker -- 0.25 packs/day for 1 years    Types: Cigarettes    Quit date: 03/25/2013  . Smokeless tobacco: Never Used  . Alcohol Use: No     Comment: pt states she quit marijuan in May 2013. Rare ETOH, + cigarettes.  She is enrolled in school   OB History    Gravida Para Term Preterm AB TAB SAB Ectopic Multiple Living   2 1 1  1  1   0 1      Obstetric Comments   Miscarried in October 2012 at about 7 weeks     Review of Systems  Constitutional: Negative for fever and chills.  HENT: Negative for sore throat.   Eyes: Negative for redness.  Respiratory: Negative for shortness of breath.   Cardiovascular: Negative for chest pain and leg swelling.  Gastrointestinal: Negative for vomiting, abdominal pain and diarrhea.  Genitourinary: Negative for flank pain.  Musculoskeletal: Negative for back pain and neck pain.  Skin: Negative for rash.  Neurological: Negative for weakness, numbness and headaches.  Hematological: Does not bruise/bleed easily.  Psychiatric/Behavioral: Negative for confusion.      Allergies  Carrot; Carrot oil; and Latex  Home  Medications   Prior to Admission medications   Medication Sig Start Date End Date Taking? Authorizing Provider  folic acid (FOLVITE) 1 MG tablet Take 2 tablets (2 mg total) by mouth daily. 03/26/15   [name], MD  methadone (DOLOPHINE) 5 MG tablet Take 1 tablet (5 mg total) by mouth at bedtime. 03/24/15   Tresa Garter, MD  oxyCODONE (ROXICODONE) 15 MG immediate release tablet Take 1 tablet (15 mg total) by mouth every 4 (four) hours as needed for pain. 03/24/15   Tresa Garter, MD   BP 116/68 mmHg  Pulse 84  Temp(Src) 99.3 F (37.4 C) (Oral)  Resp 22  Ht 5\' 1"  (1.549 m)  Wt 176 lb (79.833 kg)  BMI 33.27 kg/m2  SpO2 100%  LMP 01/20/2015 (Approximate) Physical Exam   Constitutional: She appears well-developed and well-nourished. No distress.  HENT:  Mouth/Throat: Oropharynx is clear and moist.  Eyes: Conjunctivae are normal. No scleral icterus.  Neck: Neck supple. No tracheal deviation present.  Cardiovascular: Normal rate, regular rhythm, normal heart sounds and intact distal pulses.  Exam reveals no gallop and no friction rub.   No murmur heard. Pulmonary/Chest: Effort normal and breath sounds normal. No respiratory distress.  Abdominal: Soft. Normal appearance and bowel sounds are normal. She exhibits no distension. There is no tenderness.  Musculoskeletal: She exhibits no edema or tenderness.  Good passive rom right hip, knee, ankle without pain, distal pulses palp. No swelling to leg(s). No focal bony tenderness.   Neurological: She is alert.  Skin: Skin is warm and dry. No rash noted.  Psychiatric:  Mildly anxious.   Nursing note and vitals reviewed.   ED Course  Procedures (including critical care time) Labs Review  Results for orders placed or performed during the hospital encounter of 03/30/15  CBC  Result Value Ref Range   WBC 15.3 (H) 4.0 - 10.5 K/uL   RBC 2.55 (L) 3.87 - 5.11 MIL/uL   Hemoglobin 8.3 (L) 12.0 - 15.0 g/dL   HCT 23.7 (L) 36.0 - 46.0 %   MCV 92.9 78.0 - 100.0 fL   MCH 32.5 26.0 - 34.0 pg   MCHC 35.0 30.0 - 36.0 g/dL   RDW 16.9 (H) 11.5 - 15.5 %   Platelets 558 (H) 150 - 400 K/uL  Comprehensive metabolic panel  Result Value Ref Range   Sodium 137 135 - 145 mmol/L   Potassium 3.8 3.5 - 5.1 mmol/L   Chloride 106 101 - 111 mmol/L   CO2 22 22 - 32 mmol/L   Glucose, Bld 95 65 - 99 mg/dL   BUN 6 6 - 20 mg/dL   Creatinine, Ser 0.50 0.44 - 1.00 mg/dL   Calcium 8.9 8.9 - 10.3 mg/dL   Total Protein 7.0 6.5 - 8.1 g/dL   Albumin 4.1 3.5 - 5.0 g/dL   AST 22 15 - 41 U/L   ALT 15 14 - 54 U/L   Alkaline Phosphatase 46 38 - 126 U/L   Total Bilirubin 2.2 (H) 0.3 - 1.2 mg/dL   GFR calc non Af Amer >60 >60 mL/min   GFR  calc Af Amer >60 >60 mL/min   Anion gap 9 5 - 15      I have personally reviewed and evaluated these and lab results as part of my medical decision-making.    MDM   Iv ns bolus.   Pt requests dilaudid and toradol for her pain.    Pt indicates recent pos preg test, indicates  she has elective ab scheduled for the next couple weeks.   Dilaudid iv. toradol iv.   Reviewed nursing notes and prior charts for additional history.   Patient w somewhat of unusual affect/inconsistent exam - pt indicates pain 10/10, and that lightly touching skin is very painful, and in next minutes, shifts position/moves about stretcher freely without apparent pain, and asks for sandwich/drink.    Pt requests additional pain med/c/o severe pain. Dilaudid iv.   Recheck appears comfortable.  Nurse notified pain still high.  I went to recheck on patient 5 minutes later, and pt sleeping comfortably.  On arousing patient, and discussing tx options, pt indicates she does not want to be admitted to hospital, pt requests we call sickle cell clinic - states although she has appt there tomorrow, she would like to go there for additional pain rx.   Sickle cell clinic called - discussed pt with Dr Doreene Burke - he indicates we can send pt from ED to sickle cell clinic.   Pt currently appears stable for d/c from ED - pt to go directly to sickle cell clinic for their eval, and f/u planning.          Lajean Saver, MD 03/30/15 [phone]

## 2015-03-30 NOTE — Discharge Instructions (Signed)
It was our pleasure to provide your ER care today - we hope that you feel better.  We discussed your case with our sickle cell clinic physician, Dr Doreene Burke.  Return to ER if worse, new symptoms, fevers, trouble breathing, other concern.  You were given pain medication in the ER - no driving for the next 6 hours.      Sickle Cell Anemia, Adult Sickle cell anemia is a condition in which red blood cells have an abnormal "sickle" shape. This abnormal shape shortens the cells' life span, which results in a lower than normal concentration of red blood cells in the blood. The sickle shape also causes the cells to clump together and block free blood flow through the blood vessels. As a result, the tissues and organs of the body do not receive enough oxygen. Sickle cell anemia causes organ damage and pain and increases the risk of infection. CAUSES  Sickle cell anemia is a genetic disorder. Those who receive two copies of the gene have the condition, and those who receive one copy have the trait. RISK FACTORS The sickle cell gene is most common in people whose families originated in [place]. Other areas of the globe where sickle cell trait occurs include the [place], [place] and [place], and the [place].  SIGNS AND SYMPTOMS  Pain, especially in the extremities, back, chest, or abdomen (common). The pain may start suddenly or may develop following an illness, especially if there is dehydration. Pain can also occur due to overexertion or exposure to extreme temperature changes.  Frequent severe bacterial infections, especially certain types of pneumonia and meningitis.  Pain and swelling in the hands and feet.  Decreased activity.   Loss of appetite.   Change in behavior.  Headaches.  Seizures.  Shortness of breath or difficulty breathing.  Vision changes.  Skin ulcers. Those with the trait may not have symptoms or they may have mild symptoms.  DIAGNOSIS    Sickle cell anemia is diagnosed with blood tests that demonstrate the genetic trait. It is often diagnosed during the newborn period, due to mandatory testing nationwide. A variety of blood tests, X-rays, CT scans, MRI scans, ultrasounds, and lung function tests may also be done to monitor the condition. TREATMENT  Sickle cell anemia may be treated with:  Medicines. You may be given pain medicines, antibiotic medicines (to treat and prevent infections) or medicines to increase the production of certain types of hemoglobin.  Fluids.  Oxygen.  Blood transfusions. HOME CARE INSTRUCTIONS   Drink enough fluid to keep your urine clear or pale yellow. Increase your fluid intake in hot weather and during exercise.  Do not smoke. Smoking lowers oxygen levels in the blood.   Only take over-the-counter or prescription medicines for pain, fever, or discomfort as directed by your health care provider.  Take antibiotics as directed by your health care provider. Make sure you finish them it even if you start to feel better.   Take supplements as directed by your health care provider.   Consider wearing a medical alert bracelet. This tells anyone caring for you in an emergency of your condition.   When traveling, keep your medical information, health care provider's names, and the medicines you take with you at all times.   If you develop a fever, do not take medicines to reduce the fever right away. This could cover up a problem that is developing. Notify your health care provider.  Keep all follow-up appointments with your  health care provider. Sickle cell anemia requires regular medical care. SEEK MEDICAL CARE IF: You have a fever. SEEK IMMEDIATE MEDICAL CARE IF:   You feel dizzy or faint.   You have new abdominal pain, especially on the left side near the stomach area.   You develop a persistent, often uncomfortable and painful penile erection (priapism). If this is not treated  immediately it will lead to impotence.   You have numbness your arms or legs or you have a hard time moving them.   You have a hard time with speech.   You have a fever or persistent symptoms for more than 2-3 days.   You have a fever and your symptoms suddenly get worse.   You have signs or symptoms of infection. These include:   Chills.   Abnormal tiredness (lethargy).   Irritability.   Poor eating.   Vomiting.   You develop pain that is not helped with medicine.   You develop shortness of breath.  You have pain in your chest.   You are coughing up pus-like or bloody sputum.   You develop a stiff neck.  Your feet or hands swell or have pain.  Your abdomen appears bloated.  You develop joint pain. MAKE SURE YOU:  Understand these instructions.   This information is not intended to replace advice given to you by your health care provider. Make sure you discuss any questions you have with your health care provider.   Document Released: 08/10/2005 Document Revised: 05/23/2014 Document Reviewed: 12/12/2012 Elsevier Interactive Patient Education [employer].

## 2015-03-30 NOTE — ED Notes (Signed)
Ems called to home.  Found patient with complaints of sickle cell crisis pain.  Patient States that the is worse than normal.  She has taken her oxycodone at home with some  Relief but the pain is now uncontrollable with home meds.  Currently pain is 10 of 10 in  Right hip, knee and foot.

## 2015-03-30 NOTE — ED Notes (Signed)
Bed: [id number] Expected date:  Expected time:  Means of arrival:  Comments: EMS sickle cell

## 2015-03-30 NOTE — ED Notes (Signed)
Pt request something else for pain. [place] made aware.

## 2015-03-30 NOTE — H&P (Signed)
[facility] History and Physical  ETHELINE LEON [id number] DOB: Sep 24, 1990 DOA: 03/30/2015  PCP: Angelica Chessman, MD   Chief Complaint:  Chief Complaint  Patient presents with  . Sickle Cell Pain Crisis    HPI: [name] is a 24 y.o. female with history of sickle cell disease, depression, GERD, migraine headaches and chronic back pain was initially seen in the ED for sickle cell pain crisis very early this morning, was managed with multiple doses of IV Dilaudid, pain remained uncontrolled but patient declined admission, patient was transferred to the day hospital for further evaluation and pain management. She received the usual pain crisis management at the day hospital with IV PCA Dilaudid and IV fluid. Toradol was avoided due to early pregnancy. After about 6 hours of IV fluid and pain control, patient still not able to ambulate without support, claims pain is still about 8 out of 10. The decision is to admit patient to the hospital for further evaluation and pain control. I discussed with Dr. Zigmund Daniel, who accepted patient for admission and will see patient in the morning. She denies fever, chills, but feels fatigued. She denies headache, sore throat, runny nose, productive cough, denies abdominal pain, no nausea, vomiting or diarrhea, She denies GU symptoms.  Systemic Review: General: The patient denies anorexia, fever, weight loss Cardiac: Denies chest pain, syncope, palpitations, pedal edema  Respiratory: Denies cough, shortness of breath, wheezing GI: Denies severe indigestion/heartburn, abdominal pain, nausea, vomiting, diarrhea and constipation GU: Denies hematuria, incontinence, dysuria  Musculoskeletal: Pain in legs especially right leg, not able to ambulate Skin: Denies suspicious skin lesions Neurologic: Denies focal weakness or numbness, change in vision  Past Medical History  Diagnosis Date  . Miscarriage 03/22/2011    Pt reports 2  miscarriages.  . Depression 01/06/2011  . GERD (gastroesophageal reflux disease) 02/17/2011  . Trichotillomania     h/o  . Blood transfusion     "lots"  . Sickle cell anemia with crisis ([place])   . Exertional dyspnea     "sometimes"  . Migraines 11/08/11    "@ least twice/month"  . Chronic back pain     "very severe; have knot in my back; from tight muscle; take RX and exercise for it"  . Mood swings ([place]) 11/08/11    "I go back and forth; real bad"  . Sickle cell anemia [facility])     Past Surgical History  Procedure Laterality Date  . Cholecystectomy  05/2010  . Dilation and curettage of uterus  02/20/11    S/P miscarriage    Allergies  Allergen Reactions  . Carrot [Daucus Carota] Hives, Swelling and Rash    Dietary:  Please do not send any form of carrot to patient  . Carrot Oil Hives and Swelling  . Latex Rash    *powered Latex*     Family History  Problem Relation Age of Onset  . Diabetes Maternal Grandmother   . Diabetes Paternal Grandmother   . Hypertension Paternal Grandmother   . Diabetes Maternal Grandfather   . Sickle cell trait Mother   . Sickle cell trait Father       Prior to Admission medications   Medication Sig Start Date End Date Taking? Authorizing Provider  hydroxyurea (HYDREA) 500 MG capsule Take 500 mg by mouth daily. May take with food to minimize GI side effects.   Yes Historical Provider, MD  methadone (DOLOPHINE) 5 MG tablet Take 1 tablet (5 mg total) by mouth at bedtime.  03/24/15  Yes Kendel Bessey Essie Christine, MD  oxyCODONE (ROXICODONE) 15 MG immediate release tablet Take 1 tablet (15 mg total) by mouth every 4 (four) hours as needed for pain. 03/24/15  Yes Tresa Garter, MD  folic acid (FOLVITE) 1 MG tablet Take 2 tablets (2 mg total) by mouth daily. 03/26/15   [name], MD     Physical Exam: Filed Vitals:   03/30/15 1415 03/30/15 1515 03/30/15 1558 03/30/15 1615  BP: 99/61 124/70  108/63  Pulse: 80 107 102 98  Temp:    98.4 F  (36.9 C)  TempSrc:    Oral  Resp: 10 17    Height:      Weight:      SpO2: 100% 93% 100% 100%    General: Alert, awake, afebrile, anicteric, in moderate painful distress, tearful HEENT: Normocephalic and Atraumatic, Mucous membranes pink                PERRLA; EOM intact; No scleral icterus,                 Nares: Patent, Oropharynx: Clear, Fair Dentition                 Neck: FROM, no cervical lymphadenopathy, thyromegaly, carotid bruit or JVD;  CHEST: Normal respiration, clear to auscultation bilaterally  HEART: Regular rate and rhythm; no murmurs rubs or gallops  ABDOMEN: Positive Bowel Sounds, soft, non-tender; no masses, no organomegaly Rectal Exam: deferred EXTREMITIES: Extreme tenderness on the right leg SKIN:  no rash or ulceration  CNS: Alert and Oriented x 4, Nonfocal exam, CN 2-12 intact  Labs on Admission:  Basic Metabolic Panel:  Recent Labs Lab 03/30/15 0726  NA 137  K 3.8  CL 106  CO2 22  GLUCOSE 95  BUN 6  CREATININE 0.50  CALCIUM 8.9   Liver Function Tests:  Recent Labs Lab 03/30/15 0726  AST 22  ALT 15  ALKPHOS 46  BILITOT 2.2*  PROT 7.0  ALBUMIN 4.1   No results for input(s): LIPASE, AMYLASE in the last 168 hours. No results for input(s): AMMONIA in the last 168 hours. CBC:  Recent Labs Lab 03/23/15 1731 03/23/15 2320 03/24/15 0405 03/30/15 0726  WBC 14.4* 14.8* 13.4* 15.3*  NEUTROABS 9.6* 9.9* 8.2*  --   HGB 8.1* 7.5* 8.1* 8.3*  HCT 23.2* 20.9* 23.0* 23.7*  MCV 90.6 89.3 89.8 92.9  PLT 550* 428* 468* 558*   Cardiac Enzymes: No results for input(s): CKTOTAL, CKMB, CKMBINDEX, TROPONINI in the last 168 hours.  BNP (last 3 results) No results for input(s): BNP in the last 8760 hours.  ProBNP (last 3 results) No results for input(s): PROBNP in the last 8760 hours.  CBG: No results for input(s): GLUCAP in the last 168 hours.   Assessment/Plan Active Problems:   Sickle-cell disease with pain ([place])   Admits MedSurg  inpatient  IVF D5 .45% Saline @ 125 mls/hour  Weight based Dilaudid PCA   Monitor vitals very closely  Oxygen by [name]  Early Pregnancy: Patient has appointment with gynecologist on December 5 to discuss this pregnancy, according to patient, it is unwanted.  Sickle Cell Disease: Continue hydroxyurea and folic acid Check CBCD and Retic Count Monitor vitals signs closely  Leukocytosis: May be reactive, will monitor  Code Status: Full  Family Communication: Discussed with patient's mother and Case Manager  DVT Prophylaxis: Subcut Lovenox   Time spent: 79 Minutes  Shanora Christensen, MD, MHA, [place], [place], CPE  If  7PM-7AM, please contact night-coverage [email or web address] 03/30/2015, 5:18 PM

## 2015-03-30 NOTE — Discharge Summary (Signed)
  Physician Discharge Summary  UTE WILLHELM [id number] DOB: [birth year] DOA: 03/30/2015  PCP: Angelica Chessman, MD  Admit date: 03/30/2015  Discharge date: 03/30/2015  Time spent: 30 minutes  Discharge Diagnoses:  Active Problems:   Sickle-cell disease with pain ([name])   Discharge Condition: Stable  Diet recommendation: Regular  Filed Weights   03/30/15 1027  Weight: 176 lb (79.833 kg)    History of present illness:  [name] is a 24 y.o. female with history of sickle cell disease, major depression, GERD, positive pregnancy test confirmed on 03/22/2015, currently nursing 14 month old son but not breastfeeding, was transferred to the day hospital from ED this morning because of intractable pain in her legs especially the right starting from the groin area all the way down preventing proper ambulation. She describes this pain as sharp, similar to her pain crisis, 10 out of 10. No fever. No cough, no chest pain. No urinary symptoms. She was recently hospitalized and managed for sickle cell pain crisis, discharged on 03/26/2015. After about 6 hours of PCA Dilaudid for pain control, patient is still in severe pain, unable to ambulate without support due to "excruciating" pain, patient is tearful and would like to be admitted to the hospital for further evaluation and pain management.  Discharge Exam: Filed Vitals:   03/30/15 1615  BP: 108/63  Pulse: 98  Temp: 98.4 F (36.9 C)  Resp:     General appearance: alert, in moderate painful distress Eyes: conjunctivae/corneas clear. PERRL, EOM's intact. Fundi benign. Neck: no adenopathy, no carotid bruit, no JVD, supple, symmetrical, trachea midline and thyroid not enlarged, symmetric, no tenderness/mass/nodules Back: symmetric, no curvature. ROM normal. No CVA tenderness. Resp: clear to auscultation bilaterally Chest wall: no tenderness Cardio: regular rate and rhythm, S1, S2 normal, no murmur, click, rub or gallop GI:  soft, non-tender; bowel sounds normal; no masses, no organomegaly Extremities: Severe tenderness on the right leg from the groin area, thigh and leg Pulses: 2+ and symmetric Skin: Skin color, texture, turgor normal. No rashes or lesions Neurologic: Grossly normal  Discharge Instructions Transfer to inpatient for further management.   Current Discharge Medication List    CONTINUE these medications which have NOT CHANGED   Details  hydroxyurea (HYDREA) 500 MG capsule Take 500 mg by mouth daily. May take with food to minimize GI side effects.    methadone (DOLOPHINE) 5 MG tablet Take 1 tablet (5 mg total) by mouth at bedtime. Qty: 30 tablet, Refills: 0   Associated Diagnoses: Chronic pain    oxyCODONE (ROXICODONE) 15 MG immediate release tablet Take 1 tablet (15 mg total) by mouth every 4 (four) hours as needed for pain. Qty: 90 tablet, Refills: 0   Associated Diagnoses: Chronic pain    folic acid (FOLVITE) 1 MG tablet Take 2 tablets (2 mg total) by mouth daily. Qty: 60 tablet, Refills: 2   Associated Diagnoses: Pregnancy at early stage; Hb-SS disease without crisis ([place])       Allergies  Allergen Reactions  . Carrot [Daucus Carota] Hives, Swelling and Rash    Dietary:  Please do not send any form of carrot to patient  . Carrot Oil Hives and Swelling  . Latex Rash    *powered Latex*      Significant Diagnostic Studies: No results found.  Signed:  Angelica Chessman MD, [place], [place], [place], CPE   03/30/2015, 5:07 PM

## 2015-03-31 ENCOUNTER — Inpatient Hospital Stay (HOSPITAL_COMMUNITY): Payer: Medicare Other

## 2015-03-31 ENCOUNTER — Ambulatory Visit: Payer: Medicare Other | Admitting: Internal Medicine

## 2015-03-31 DIAGNOSIS — D5700 Hb-SS disease with crisis, unspecified: Secondary | ICD-10-CM

## 2015-03-31 DIAGNOSIS — R600 Localized edema: Secondary | ICD-10-CM

## 2015-03-31 DIAGNOSIS — M7989 Other specified soft tissue disorders: Secondary | ICD-10-CM

## 2015-03-31 MED ORDER — HYDROMORPHONE HCL 2 MG/ML IJ SOLN
2.0000 mg | INTRAMUSCULAR | Status: DC | PRN
  Administered 2015-04-01 – 2015-04-02 (×10): 2 mg via INTRAVENOUS
  Filled 2015-03-31 (×11): qty 1

## 2015-03-31 MED ORDER — HYDROMORPHONE HCL 2 MG/ML IJ SOLN
2.0000 mg | INTRAMUSCULAR | Status: AC
  Administered 2015-03-31 (×6): 2 mg via INTRAVENOUS
  Filled 2015-03-31 (×5): qty 1

## 2015-03-31 MED ORDER — OXYCODONE-ACETAMINOPHEN 5-325 MG PO TABS
2.0000 | ORAL_TABLET | Freq: Once | ORAL | Status: DC
  Filled 2015-03-31: qty 2

## 2015-03-31 MED ORDER — POLYETHYLENE GLYCOL 3350 17 G PO PACK
17.0000 g | PACK | Freq: Every day | ORAL | Status: DC | PRN
  Administered 2015-04-01: 17 g via ORAL
  Filled 2015-03-31 (×2): qty 1

## 2015-03-31 MED ORDER — ENOXAPARIN SODIUM 40 MG/0.4ML ~~LOC~~ SOLN
40.0000 mg | SUBCUTANEOUS | Status: DC
  Administered 2015-03-31 – 2015-04-03 (×4): 40 mg via SUBCUTANEOUS
  Filled 2015-03-31 (×4): qty 0.4

## 2015-03-31 MED ORDER — HYDROMORPHONE 1 MG/ML IV SOLN
INTRAVENOUS | Status: DC
  Administered 2015-03-31: 13:00:00 via INTRAVENOUS
  Administered 2015-03-31: 10.25 mg via INTRAVENOUS
  Administered 2015-03-31: 5.6 mg via INTRAVENOUS
  Administered 2015-03-31: 8.5 mg via INTRAVENOUS
  Administered 2015-03-31: 6.65 mg via INTRAVENOUS
  Administered 2015-04-01: 6.3 mg via INTRAVENOUS
  Administered 2015-04-01: 3.6 mg via INTRAVENOUS
  Administered 2015-04-01: 13.2 mg via INTRAVENOUS
  Administered 2015-04-01: 4.2 mg via INTRAVENOUS
  Administered 2015-04-01: 10:00:00 via INTRAVENOUS
  Administered 2015-04-01: 3.5 mg via INTRAVENOUS
  Administered 2015-04-01: 7.8 mg via INTRAVENOUS
  Administered 2015-04-02: 5.4 mg via INTRAVENOUS
  Administered 2015-04-02: via INTRAVENOUS
  Administered 2015-04-02: 12.6 mg via INTRAVENOUS
  Administered 2015-04-02: 1.6 mg via INTRAVENOUS
  Administered 2015-04-02: 5.4 mg via INTRAVENOUS
  Administered 2015-04-02: 4.2 mg via INTRAVENOUS
  Administered 2015-04-03: 7.2 mg via INTRAVENOUS
  Administered 2015-04-03: 4.18 mg via INTRAVENOUS
  Administered 2015-04-03: 7.2 mg via INTRAVENOUS
  Administered 2015-04-03: 1.2 mg via INTRAVENOUS
  Administered 2015-04-03: 01:00:00 via INTRAVENOUS
  Administered 2015-04-03: 4.2 mg via INTRAVENOUS
  Filled 2015-03-31 (×4): qty 25

## 2015-03-31 MED ORDER — ACETAMINOPHEN 325 MG PO TABS: 650.0000 mg | ORAL_TABLET | Freq: Four times a day (QID) | ORAL | Status: DC | PRN

## 2015-03-31 NOTE — Progress Notes (Signed)
VASCULAR LAB PRELIMINARY  PRELIMINARY  PRELIMINARY  PRELIMINARY  Right lower extremity venous duplex completed.    Preliminary report:  Right:  No evidence of DVT, superficial thrombosis, or Baker's cyst.  Evangelina Delancey, RVS 03/31/2015, 3:36 PM

## 2015-03-31 NOTE — Progress Notes (Signed)
SICKLE CELL SERVICE PROGRESS NOTE  [name] [id number] DOB: June 06, 1990 DOA: 03/30/2015 PCP: Angelica Chessman, MD  Assessment/Plan: Active Problems:   Sickle-cell disease with pain ([place])  1. Hb SS with crisis: Pt reports pain localized to back and right LE at intensity of 10/10. She has used 44 mg with 65/64:demands/deliveries in the last 24 hours. Will  continue PCA and adjust dose to a bolus dose of 0.6 mg and add scheduled clinician assisted doses every 2 hours for the next 12 hours then change to q 2 hours PRN. NSAID's are not an option for treatment as patient was found to be pregnant. Due to teratogenic effects, NSAID's and Hydrea are contraindicated.  2. RLE Swelling: Pt has swelling in the calf of the right leg. Given her state of pregnancy and Hb SS, she is predisposed to developed DVT. Will obtain RLE venous duplex. 3. Pregnancy at early stage: This is an unplanned pregnancy and expresses that she wants to terminate the pregnancy. She is just recently post-partum. 4. Anemia of chronic disease: Pt at baseline Hb. However he Hb during pregnancy should be maintained above 8 g/dl.  5. Chronic pain: Continue Methadone 5 mg HS.  Code Status: Full Code Family Communication: N/A Disposition Plan: Not yet ready for discharge.  [place] Kathan A.  Pager [phone]. If 7PM-7AM, please contact night-coverage.  03/31/2015, 11:02 AM  LOS: 1 day    Consultants:  none  Procedures:  None  Antibiotics:  None  HPI/Subjective: Pt reports pain localized to RLE and back at intensity of 10/10.   Objective: Filed Vitals:   03/30/15 2202 03/31/15 0440 03/31/15 0651 03/31/15 0830  BP: 98/63  105/58   Pulse: 87  90   Temp: 98.6 F (37 C)  98.3 F (36.8 C)   TempSrc: Oral  Oral   Resp: 14 14 14 13   Height:      Weight:   176 lb (79.833 kg)   SpO2: 99% 99% 100% 98%   Weight change:   Intake/Output Summary (Last 24 hours) at 03/31/15 1102 Last data filed at 03/30/15  2208  Gross per 24 hour  Intake    240 ml  Output      0 ml  Net    240 ml    General: Alert, awake, oriented x3, in severe distress secondary to pain in RLE. Marland Kitchen  HEENT: [place]/AT PEERL, EOMI, anciteric OROPHARYNX:  Moist, No exudate/ erythema/lesions.  Heart: Regular rate and rhythm, without murmurs, rubs, gallops.  Lungs: Clear to auscultation, no wheezing or rhonchi noted.  Abdomen: Soft, nontender, nondistended, positive bowel sounds, no masses no hepatosplenomegaly noted.  Neuro: No focal neurological deficits noted cranial nerves II through XII grossly intact.  Strength at functional baseline in bilateral upper and lower extremities. Unable to assess Musculoskeletal: Warmth swelling or tenderness noted in right calf. There is no swelling, warmth or erythema around joints, no spinal tenderness noted. Gait not assessed due to pain. Psychiatric: Patient alert and oriented x3,tearful due to pain   Data Reviewed: Basic Metabolic Panel:  Recent Labs Lab 03/30/15 0726  NA 137  K 3.8  CL 106  CO2 22  GLUCOSE 95  BUN 6  CREATININE 0.50  CALCIUM 8.9   Liver Function Tests:  Recent Labs Lab 03/30/15 0726  AST 22  ALT 15  ALKPHOS 46  BILITOT 2.2*  PROT 7.0  ALBUMIN 4.1   No results for input(s): LIPASE, AMYLASE in the last 168 hours. No results for input(s): AMMONIA in the  last 168 hours. CBC:  Recent Labs Lab 03/30/15 0726 03/30/15 2235  WBC 15.3* 15.0*  NEUTROABS  --  11.1*  HGB 8.3* 7.5*  HCT 23.7* 21.6*  MCV 92.9 91.9  PLT 558* 535*    CBG: No results for input(s): GLUCAP in the last 168 hours.  No results found for this or any previous visit (from the past 240 hour(s)).   Studies: No results found.  Scheduled Meds: . enoxaparin (LOVENOX) injection  40 mg Subcutaneous Q24H  . folic acid  1 mg Oral Daily  . HYDROmorphone   Intravenous 6 times per day  .  HYDROmorphone (DILAUDID) injection  2 mg Intravenous Q2H  . senna-docusate  1 tablet Oral BID    Continuous Infusions: . dextrose 5 % and 0.45% NaCl 125 mL/hr at 03/30/15 2149    Time spent 30 minutes

## 2015-03-31 NOTE — Progress Notes (Signed)
Called by [employer] RN, Nadine to come talk with patient concerning management of pain and treatment plan.  Patient was disturbing other patients and complaints made concerning yelling and crying of patient.  With patients permission, I sat at bedside and talked to patient concerning treatment plan and pain management.  Patient tearful, crying out and yelling prior to entering the patient's room.  Patient remained tearful, crying and at times yelling during our conversation.  I listened to the patient's concerns with pain management or per patient "lack of pain management."  Patient requesting IV push dose of additional pain medicine to assist with pain.  I explained to patient that the on-call Dr has added pain pills to the current active treatment plan to assist with pain management.  Patient refusing at this time to take additional pain medicine and comfort treatments such as heating pad.  Patient expressed dissatisfaction with the care of the drs and nursing staff.  Patient and I discussed patients options to help assist with pain management and patient satisfaction.  Patient at this time refuses to consent to current treatment plan and is requesting to be assigned another Dr.  Patient will discuss with [name] in the am.  Patient expressed " Feeling like she was being lied too about being able to administer additional IV pain medications."  I spent 40 minutes at the patients bedside to listen and attempt to reach an effective treatment plan.  Patient at this time is refusing additional measures to manage pain and requesting a "new Dr."  I encouraged patient to talk with [name] in the am.  I informed patient if she changes her mind and would like to try additional treatment options to let [name] know and we would be happy to administer additional medicines.  Patient denies questions at this time.  Charge RN informed to notify me should there be additional concerns or complaints.

## 2015-03-31 NOTE — Progress Notes (Signed)
Pt complained of sever pain 10 out of 10 and asked for a one time breakthrough i.v pain med. Oncall was notified but her wishes were not granted. Pt began to cry and yell RN went in and spoke to pt. On call was notified and a breathrough pain pill was ordered and rn provided heating pad. Pt refused to take meds and use heating pad and said that was not what she wanted and wanted a new doctor. Nursing supervisor was notified and came up to speak to pt. Will continue to monitor pt closely.

## 2015-03-31 NOTE — Progress Notes (Signed)
Hospital Operator called to inform me that she just received a patient call with a complaint and request for a new Dr. The complaint and request to see a person in charge was from room 1329.  I went back in to speak with [name] and inform her the operator had just reached out to me concerning her complaint and request.  [name] is not tearful at this time.  I sat at patients bedside and listened to her complaint of pain management.  Patient expressed she would wait for [name] this am.  Patient expresses she doesn't feel other treatment options offered will assist her with pain control.  Patient encouraged to use PCA as ordered and notify [name] if she would like to try additional ordered treatment measures.  Patient offered additional  treatment options at this time and patient remains to refuse.  PCA within patients reach and Charge RN instructed to call with further concerns or complaints.

## 2015-04-01 DIAGNOSIS — E871 Hypo-osmolality and hyponatremia: Secondary | ICD-10-CM

## 2015-04-01 DIAGNOSIS — Z331 Pregnant state, incidental: Secondary | ICD-10-CM

## 2015-04-01 DIAGNOSIS — K5909 Other constipation: Secondary | ICD-10-CM

## 2015-04-01 LAB — CBC WITH DIFFERENTIAL/PLATELET
Basophils Absolute: 0.1 10*3/uL (ref 0.0–0.1)
Basophils Relative: 0 %
EOS ABS: 0.4 10*3/uL (ref 0.0–0.7)
EOS PCT: 3 %
HCT: 22 % — ABNORMAL LOW (ref 36.0–46.0)
Hemoglobin: 7.6 g/dL — ABNORMAL LOW (ref 12.0–15.0)
LYMPHS ABS: 1.5 10*3/uL (ref 0.7–4.0)
Lymphocytes Relative: 13 %
MCH: 31.4 pg (ref 26.0–34.0)
MCHC: 34.5 g/dL (ref 30.0–36.0)
MCV: 90.9 fL (ref 78.0–100.0)
MONO ABS: 1.6 10*3/uL — AB (ref 0.1–1.0)
MONOS PCT: 13 %
Neutro Abs: 8.7 10*3/uL — ABNORMAL HIGH (ref 1.7–7.7)
Neutrophils Relative %: 71 %
PLATELETS: 516 10*3/uL — AB (ref 150–400)
RBC: 2.42 MIL/uL — ABNORMAL LOW (ref 3.87–5.11)
RDW: 15.9 % — ABNORMAL HIGH (ref 11.5–15.5)
WBC: 12.3 10*3/uL — ABNORMAL HIGH (ref 4.0–10.5)

## 2015-04-01 LAB — RETICULOCYTES
RBC.: 2.42 MIL/uL — ABNORMAL LOW (ref 3.87–5.11)
RETIC CT PCT: 18.2 % — AB (ref 0.4–3.1)
Retic Count, Absolute: 440.4 10*3/uL — ABNORMAL HIGH (ref 19.0–186.0)

## 2015-04-01 LAB — BASIC METABOLIC PANEL
Anion gap: 8 (ref 5–15)
BUN: 5 mg/dL — ABNORMAL LOW (ref 6–20)
CHLORIDE: 102 mmol/L (ref 101–111)
CO2: 24 mmol/L (ref 22–32)
CREATININE: 0.44 mg/dL (ref 0.44–1.00)
Calcium: 9.1 mg/dL (ref 8.9–10.3)
GFR calc non Af Amer: >60 mL/min (ref >60)
Glucose, Bld: 113 mg/dL — ABNORMAL HIGH (ref 65–99)
Potassium: 3.7 mmol/L (ref 3.5–5.1)
Sodium: 134 mmol/L — ABNORMAL LOW (ref 135–145)

## 2015-04-01 MED ORDER — LIP MEDEX EX OINT
TOPICAL_OINTMENT | CUTANEOUS | Status: AC
  Administered 2015-04-01: 1
  Filled 2015-04-01: qty 7

## 2015-04-01 NOTE — Care Management Note (Signed)
Case Management Note  Patient Details  Name: [name] MRN: [id number] Date of Birth: August 06, 1990  Subjective/Objective:             24 yo admitted with [facility]       Action/Plan: From home with family  Expected Discharge Date:  04/03/15               Expected Discharge Plan:  Home/Self Care  In-House Referral:     Discharge planning Services  CM Consult  Post Acute Care Choice:    Choice offered to:     DME Arranged:    DME Agency:     HH Arranged:    [place] Agency:     Status of Service:  In process, will continue to follow  Medicare Important Message Given:    Date Medicare IM Given:    Medicare IM give by:    Date Additional Medicare IM Given:    Additional Medicare Important Message give by:     If discussed at [place] of Stay Meetings, dates discussed:    Additional Comments: Chart reviewed and CM following for DC needs Lynnell Catalan, RN 04/01/2015, 1:10 PM

## 2015-04-01 NOTE — Progress Notes (Signed)
SICKLE CELL SERVICE PROGRESS NOTE  [name] [id number] DOB: October 17, 1990 DOA: 03/30/2015 PCP: Angelica Chessman, MD  Assessment/Plan: Active Problems:   Sickle-cell disease with pain ([place])  1. Hb SS with crisis: Pt reports pain localized to back and right LE at intensity of 7/10. She has used 31.6 mg with 52/52:demands/deliveries in addition to 18 mg in clinician assisted doses in the last 24 hours. Will  continue PCA and  clinician assisted doses at every 2 hours PRN. NSAID's are not an option for treatment as patient was found to be pregnant. Due to teratogenic effects, NSAID's and Hydrea are contraindicated.  2. RLE Swelling: Pt has swelling in the calf of the right leg. Swelling and tenderness decreased today. Duplex negative for DVT. 3. Pregnancy at early stage: This is an unplanned pregnancy and expresses that she wants to terminate the pregnancy. She is just recently post-partum. 4. Anemia of chronic disease: Pt at baseline Hb. However he Hb during pregnancy should be maintained above 8 g/dl.  5. Mild Hyponatremia: Will discontinue hypotonic fluids and expect that hyponatremia will correct by itself as patient eating and drinking well. 6. Constipation: Pt still has not had a BM. Currently receiving Miralax. If not successful will give Magnesium Citrate. 7. Chronic pain: Continue Methadone 5 mg HS.  Code Status: Full Code Family Communication: N/A Disposition Plan: Not yet ready for discharge.  Leodan Bolyard A.  Pager [phone]. If 7PM-7AM, please contact night-coverage.  04/01/2015, 11:49 AM  LOS: 2 days    Consultants:  none  Procedures:  None  Antibiotics:  None  HPI/Subjective: Pt reports pain localized to RLE and back at intensity of 7/10.   Objective: Filed Vitals:   04/01/15 0527 04/01/15 0755 04/01/15 1025 04/01/15 1054  BP: 97/55  85/52 98/48  Pulse: 95  85 90  Temp: 97.9 F (36.6 C)  98.7 F (37.1 C)   TempSrc: Oral  Oral   Resp: 10 13  14    Height:      Weight:      SpO2: 96%  98%    Weight change:   Intake/Output Summary (Last 24 hours) at 04/01/15 1149 Last data filed at 04/01/15 1026  Gross per 24 hour  Intake   1580 ml  Output   3400 ml  Net  -1820 ml    General: Alert, awake, oriented x3, appears much less comfortable and in much less pain than yesterday.  HEENT: [place]/AT PEERL, EOMI, anciteric OROPHARYNX:  Moist, No exudate/ erythema/lesions.  Heart: Regular rate and rhythm, without murmurs, rubs, gallops.  Lungs: Clear to auscultation, no wheezing or rhonchi noted.  Abdomen: Soft, nontender, nondistended, positive bowel sounds, no masses no hepatosplenomegaly noted.  Neuro: No focal neurological deficits noted cranial nerves II through XII grossly intact.  Strength at functional baseline in bilateral upper and lower extremities. Unable to assess Musculoskeletal: Warmth swelling or tenderness noted in right calf. There is no swelling, warmth or erythema around joints, no spinal tenderness noted. Gait mildly antalgic.  Psychiatric: Patient alert and oriented x3,tearful due to pain   Data Reviewed: Basic Metabolic Panel:  Recent Labs Lab 03/30/15 0726 04/01/15 0750  NA 137 134*  K 3.8 3.7  CL 106 102  CO2 22 24  GLUCOSE 95 113*  BUN 6 5*  CREATININE 0.50 0.44  CALCIUM 8.9 9.1   Liver Function Tests:  Recent Labs Lab 03/30/15 0726  AST 22  ALT 15  ALKPHOS 46  BILITOT 2.2*  PROT 7.0  ALBUMIN 4.1  No results for input(s): LIPASE, AMYLASE in the last 168 hours. No results for input(s): AMMONIA in the last 168 hours. CBC:  Recent Labs Lab 03/30/15 0726 03/30/15 2235 04/01/15 0750  WBC 15.3* 15.0* 12.3*  NEUTROABS  --  11.1* 8.7*  HGB 8.3* 7.5* 7.6*  HCT 23.7* 21.6* 22.0*  MCV 92.9 91.9 90.9  PLT 558* 535* 516*    CBG: No results for input(s): GLUCAP in the last 168 hours.  No results found for this or any previous visit (from the past 240 hour(s)).   Studies: No results  found.  Scheduled Meds: . enoxaparin (LOVENOX) injection  40 mg Subcutaneous Q24H  . folic acid  1 mg Oral Daily  . HYDROmorphone   Intravenous 6 times per day  . senna-docusate  1 tablet Oral BID   Continuous Infusions: . dextrose 5 % and 0.45% NaCl 125 mL/hr at 03/30/15 2149    Time spent 30 minutes

## 2015-04-02 DIAGNOSIS — D638 Anemia in other chronic diseases classified elsewhere: Secondary | ICD-10-CM

## 2015-04-02 LAB — HEMOGLOBINOPATHY EVALUATION
HGB A2 QUANT: 4.6 % — AB (ref 0.7–3.1)
HGB F QUANT: 13.7 % — AB (ref 0.0–2.0)
HGB S QUANTITAION: 81.7 % — AB
Hgb A: 0 % — ABNORMAL LOW (ref 94.0–98.0)
Hgb C: 0 %

## 2015-04-02 MED ORDER — HYDROMORPHONE HCL 4 MG PO TABS
4.0000 mg | ORAL_TABLET | ORAL | Status: DC
  Administered 2015-04-02 – 2015-04-03 (×6): 4 mg via ORAL
  Filled 2015-04-02 (×6): qty 1

## 2015-04-02 MED ORDER — MAGNESIUM CITRATE PO SOLN
1.0000 | Freq: Once | ORAL | Status: AC
  Administered 2015-04-02: 1 via ORAL

## 2015-04-02 NOTE — Progress Notes (Signed)
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 Hb SS with crisis: Pt reports pain localized to back and right LE at intensity of 6/10 which is goal. She has used 27 mg with 46/46:demands/deliveries in addition to 14 mg in clinician assisted doses in the last 24 hours. Will schedule oral diluadid, continue PCA and  discontinue clinician assisted doses . NSAID's are not an option for treatment as patient was found to be pregnant. Due to teratogenic effects, NSAID's and Hydrea are contraindicated. Anticipate discharge home tomorrow. 2. RLE Swelling: Pt has swelling in the calf of the right leg. Swelling and tenderness decreased today. Duplex negative for DVT. 3. Pregnancy at early stage: This is an unplanned pregnancy and expresses that she wants to terminate the pregnancy. She is just recently post-partum. 4. Anemia of chronic disease: Pt at baseline Hb. However he Hb during pregnancy should be maintained above 8 g/dl.  5. Mild Hyponatremia: Will discontinue hypotonic fluids and expect that hyponatremia will correct by itself as patient eating and drinking well. 6. Constipation: Pt still has not had a BM despite receiving Miralax. Will give Magnesium Citrate. 7. Chronic pain: Continue Methadone 5 mg HS.  Code Status: Full Code Family Communication: N/A Disposition Plan: Not yet ready for discharge.  [name] A.  Pager [phone]. If 7PM-7AM, please contact night-coverage.  04/02/2015, 11:56 AM  LOS: 3 days    Consultants:  none  Procedures:  None  Antibiotics:  None  HPI/Subjective: Pt reports pain localized to RLE at intensity of 6/10. However she is able to ambulate without d  Objective: Filed Vitals:   04/02/15 0500 04/02/15 0503 04/02/15 0524 04/02/15 0952  BP: 87/42 92/42  87/50  Pulse: 75  76 87  Temp:  98.8 F (37.1 C)   98.7 F (37.1 C)  TempSrc: Oral   Oral  Resp: 14   50  Height:      Weight:      SpO2: 95%  95% 98%   Weight change:   Intake/Output Summary (Last 24 hours) at 04/02/15 1156 Last data filed at 04/02/15 0952  Gross per 24 hour  Intake 4521.77 ml  Output   2500 ml  Net 2021.77 ml    General: Alert, awake, oriented x3, appears in no apparent distress. HEENT: [place]/AT PEERL, EOMI, anciteric OROPHARYNX:  Moist, No exudate/ erythema/lesions.  Heart: Regular rate and rhythm, without murmurs, rubs, gallops.  Lungs: Clear to auscultation, no wheezing or rhonchi noted.  Abdomen: Soft, nontender, nondistended, positive bowel sounds, no masses no hepatosplenomegaly noted.  Neuro: No focal neurological deficits noted cranial nerves II through XII grossly intact.  Strength at functional baseline in bilateral upper and lower extremities. Unable to assess Musculoskeletal: There is no swelling, warmth or erythema around joints, no spinal tenderness noted. Gaitnormal.  Psychiatric: Patient alert and oriented x3.  Mood, memory, affect and judgement normal    Data Reviewed: Basic Metabolic Panel:  Recent Labs Lab 03/30/15 0726 04/01/15 0750  NA 137 134*  K 3.8 3.7  CL 106 102  CO2 22 24  GLUCOSE 95 113*  BUN 6 5*  CREATININE 0.50 0.44  CALCIUM 8.9 9.1   Liver Function Tests:  Recent Labs Lab 03/30/15 0726  AST 22  ALT 15  ALKPHOS 46  BILITOT 2.2*  PROT 7.0  ALBUMIN 4.1   No results for input(s): LIPASE, AMYLASE in  the last 168 hours. No results for input(s): AMMONIA in the last 168 hours. CBC:  Recent Labs Lab 03/30/15 0726 03/30/15 2235 04/01/15 0750  WBC 15.3* 15.0* 12.3*  NEUTROABS  --  11.1* 8.7*  HGB 8.3* 7.5* 7.6*  HCT 23.7* 21.6* 22.0*  MCV 92.9 91.9 90.9  PLT 558* 535* 516*    CBG: No results for input(s): GLUCAP in the last 168 hours.  No results found for this or any previous visit (from the past 240 hour(s)).   Studies: No  results found.  Scheduled Meds: . enoxaparin (LOVENOX) injection  40 mg Subcutaneous Q24H  . folic acid  1 mg Oral Daily  . HYDROmorphone   Intravenous 6 times per day  . HYDROmorphone  4 mg Oral Q4H  . magnesium citrate  1 Bottle Oral Once  . senna-docusate  1 tablet Oral BID   Continuous Infusions: . dextrose 5 % and 0.45% NaCl 1,000 mL (04/02/15 0454)    Time spent 20 minutes

## 2015-04-02 NOTE — Plan of Care (Signed)
Problem: Phase I Progression Outcomes Goal: Pain controlled with appropriate interventions Outcome: Progressing Rating pain 6-7/10 in right leg

## 2015-04-02 NOTE — Plan of Care (Signed)
Problem: Phase I Progression Outcomes Goal: Bowel Movement At Least Every 3 Days Outcome: Progressing Patient reports a small bm today

## 2015-04-02 NOTE — Care Management Important Message (Signed)
Important Message  Patient Details  Name: [name] MRN: [id number] Date of Birth: 1990-06-22   Medicare Important Message Given:  Yes    Camillo Flaming 04/02/2015, 2:48 [place] Message  Patient Details  Name: [name] MRN: [id number] Date of Birth: 03/19/1991   Medicare Important Message Given:  Yes    Camillo Flaming 04/02/2015, 2:48 PM

## 2015-04-03 ENCOUNTER — Telehealth: Payer: Self-pay | Admitting: Internal Medicine

## 2015-04-03 LAB — CBC WITH DIFFERENTIAL/PLATELET
BASOS ABS: 0 10*3/uL (ref 0.0–0.1)
BASOS PCT: 0 %
EOS PCT: 5 %
Eosinophils Absolute: 0.7 10*3/uL (ref 0.0–0.7)
HCT: 22.8 % — ABNORMAL LOW (ref 36.0–46.0)
Hemoglobin: 7.7 g/dL — ABNORMAL LOW (ref 12.0–15.0)
Lymphocytes Relative: 17 %
Lymphs Abs: 2.2 10*3/uL (ref 0.7–4.0)
MCH: 30.7 pg (ref 26.0–34.0)
MCHC: 33.8 g/dL (ref 30.0–36.0)
MCV: 90.8 fL (ref 78.0–100.0)
MONO ABS: 1.7 10*3/uL — AB (ref 0.1–1.0)
Monocytes Relative: 13 %
NEUTROS ABS: 8.6 10*3/uL — AB (ref 1.7–7.7)
Neutrophils Relative %: 65 %
PLATELETS: 496 10*3/uL — AB (ref 150–400)
RBC: 2.51 MIL/uL — AB (ref 3.87–5.11)
RDW: 16 % — AB (ref 11.5–15.5)
WBC: 13.3 10*3/uL — AB (ref 4.0–10.5)

## 2015-04-03 LAB — BASIC METABOLIC PANEL
ANION GAP: 7 (ref 5–15)
BUN: 5 mg/dL — ABNORMAL LOW (ref 6–20)
CALCIUM: 9 mg/dL (ref 8.9–10.3)
CO2: 23 mmol/L (ref 22–32)
Chloride: 105 mmol/L (ref 101–111)
Creatinine, Ser: 0.44 mg/dL (ref 0.44–1.00)
Glucose, Bld: 111 mg/dL — ABNORMAL HIGH (ref 65–99)
Potassium: 3.8 mmol/L (ref 3.5–5.1)
Sodium: 135 mmol/L (ref 135–145)

## 2015-04-03 LAB — RETICULOCYTES
RBC.: 2.51 MIL/uL — AB (ref 3.87–5.11)
RETIC COUNT ABSOLUTE: 492 10*3/uL — AB (ref 19.0–186.0)
RETIC CT PCT: 19.6 % — AB (ref 0.4–3.1)

## 2015-04-03 NOTE — Progress Notes (Signed)
Patient's pump beeping went into room and MIVF line paused and rate on pump 699 cc/h. Patient had been asleep but she arouse and said "who did that". Reset pump to correct rate of 10 cc/h. Told patient not to change settings on pump.

## 2015-04-03 NOTE — Progress Notes (Signed)
Pt discharged home. PIV was removed. Reviewed DC papers with patient. There were no questions.

## 2015-04-03 NOTE — Progress Notes (Signed)
Patient refused scheduled dilaudid. Says she will take later.

## 2015-04-03 NOTE — Plan of Care (Signed)
Problem: Phase III Progression Outcomes Goal: Pain controlled on oral analgesia Outcome: Progressing Oral dilaudid added on 11/17

## 2015-04-03 NOTE — Discharge Summary (Signed)
[name] MRN: [id number] DOB/AGE: 12/31/1990 24 y.o.  Admit date: 03/30/2015 Discharge date: 04/03/2015  Primary Care Physician:  Angelica Chessman, MD   Discharge Diagnoses:   Patient Active Problem List   Diagnosis Date Noted  . Mood swings ([place]) 11/08/2011    Priority: High  . Depression 01/06/2011    Priority: High  . Sickle cell disease ([place]) 01/08/2009    Priority: High  . Anemia of chronic disease 10/05/2012    Priority: Medium  . 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 Leukocytosis 03/22/2015  . Pregnancy at early stage 03/22/2015  . Labor abnormal 12/05/2014  . Sickle cell pain crisis ([name]) 11/26/2014  . Hypokalemia 10/29/2014  . Line sepsis ([place]) 10/27/2014  . Sickle cell crisis ([name]) 10/23/2014  . Hb-SS disease without crisis ([name]) 10/09/2014  . Supervision of high-risk pregnancy 06/11/2014  . Chronic pain 02/26/2013  . Migraines 11/08/2011  . Overweight(278.02) 05/24/2011  . GERD (gastroesophageal reflux disease) 02/17/2011  . Back pain 09/17/2010  . 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     aspirin-acetaminophen-caffeine 250-250-65 MG tablet  Commonly known as:  EXCEDRIN MIGRAINE  Take 1 tablet by mouth daily as needed for headache.     folic acid 1 MG tablet  Commonly known as:  FOLVITE  Take 2 tablets (2 mg total) by mouth daily.     hydroxyurea 500 MG capsule  Commonly known as:  HYDREA  Take 500 mg by mouth daily. May take with food to minimize GI side effects.     methadone 5 MG tablet  Commonly known as:  DOLOPHINE  Take 1 tablet (5 mg total) by mouth at bedtime.     oxyCODONE 15 MG immediate release tablet  Commonly known as:   ROXICODONE  Take 1 tablet (15 mg total) by mouth every 4 (four) hours as needed for pain.         No results found for this or any previous visit (from the past 240 hour(s)).  BRIEF ADMITTING H & P: [name] is a 24 y.o. female with history of sickle cell disease, depression, GERD, migraine headaches and chronic back pain was initially seen in the ED for sickle cell pain crisis very early this morning, was managed with multiple doses of IV Dilaudid, pain remained uncontrolled but patient declined admission, patient was transferred to the day hospital for further evaluation and pain management. She received the usual pain crisis management at the day hospital with IV PCA Dilaudid and IV fluid. Toradol was avoided due to early pregnancy. After about 6 hours of IV fluid and pain control, patient still not able to ambulate without support, claims pain is still about 8 out of 10. The decision is to admit patient to the hospital for further evaluation and pain control. I discussed with Dr. Zigmund Daniel, who accepted patient for admission and will see patient in the morning. She denies fever, chills, but feels fatigued. She denies headache, sore throat, runny nose, productive cough, denies abdominal pain, no nausea, vomiting or diarrhea, She denies GU symptoms.    Hospital Course:  Present on Admission:  . 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 Her pain was managed with Dilaudid via PCA and Clinician assisted doses. She was unable to receive NSAID's as an adjunct for management of inflammatory component of pain due to pregnancy. She also received hypotonic IVF for 48 hours. As pain improved she was transitioned to oral analgesics. Today her pain is 5-6/10 which is her baseline pain. She is discharged home on prehospital regimen of Methadone and Oxycodone. She is to follow up with her PMD on 04/07/2015 at appointment already scheduled.  . Constipation: Pt had  difficulty with bowel movements likely due to opiate effect. She was treated initially with Miralax with no results. She was subsequently given Magnesium Citrate with good results.   . Hyponatremia: Pt had mild hyponatremia which corrected with regular dietary intake.  . Chronic Pain: Pt was continued on Methadone 5 mg at bedtime.  Disposition and Follow-up:  Pt was discharged home in good condition.  She will follow with Dr. Doreene Burke on an appointment on 04/07/2015.     Discharge Instructions    Activity as tolerated - No restrictions    Complete by:  As directed      Diet general    Complete by:  As directed            DISCHARGE EXAM:  General: Alert, awake, oriented x3, in mild distress.  Vital Signs: BP 124/72, HR 93, T 98.3 F (36.8 C), temperature source Oral,RR 14, height 5\' 1"  (1.549 m), weight 178 lb (80.74 kg), last menstrual period 01/20/2015, SpO2 100 %, not currently breastfeeding. HEENT: [place]/AT PEERL, EOMI, anicteric Neck: Trachea midline, no masses, no thyromegal,y no JVD, no carotid bruit OROPHARYNX: Moist, No exudate/ erythema/lesions.  Heart: Regular rate and rhythm, without murmurs, rubs, gallops or S3. PMI non-displaced. Exam reveals no decreased pulses. Pulmonary/Chest: Normal effort. Breath sounds normal. No. Apnea. Clear to auscultation,no stridor,  no wheezing and no rhonchi noted. No respiratory distress and no tenderness noted. Abdomen: Soft, nontender, nondistended, normal bowel sounds, no masses no hepatosplenomegaly noted. No fluid wave and no ascites. There is no guarding or rebound. Neuro: Alert and oriented to person, place and time. Normal motor skills, Displays no atrophy or tremors and exhibits normal muscle tone.  No focal neurological deficits noted cranial nerves II through XII grossly intact. No sensory deficit noted. Strength at baseline in bilateral upper and lower extremities. Gait normal. Musculoskeletal: No warm swelling or erythema around  joints, no spinal tenderness noted. Psychiatric: Patient alert and oriented x3, good insight and cognition, good recent to remote recall. Mood, memory, affect and judgement normal Lymph node survey: No cervical axillary or inguinal lymphadenopathy noted. Skin: Skin is warm and dry. No bruising, no ecchymosis and no rash noted. Pt is not diaphoretic. No erythema. No pallor     Recent Labs  04/01/15 0750 04/03/15 0400  NA 134* 135  K 3.7 3.8  CL 102 105  CO2 24 23  GLUCOSE 113* 111*  BUN 5* 5*  CREATININE 0.44 0.44  CALCIUM 9.1 9.0   No results for input(s): AST, ALT, ALKPHOS, BILITOT, PROT, ALBUMIN in the last 72 hours. No results for input(s): LIPASE, AMYLASE in the last 72 hours.  Recent Labs  04/01/15 0750 04/03/15 0400  WBC 12.3* 13.3*  NEUTROABS 8.7* 8.6*  HGB 7.6* 7.7*  HCT 22.0* 22.8*  MCV 90.9 90.8  PLT 516* 496*     Total time spent including face to face and decision making was greater than 30 minutes  Signed: Almeda Ezra A. 04/03/2015, 12:10 PM

## 2015-04-03 NOTE — Progress Notes (Signed)
Patient requesting a IVP of pain medication. Pain 7/10 in r leg and back. Paged NP Schorr about patient's request.

## 2015-04-03 NOTE — Progress Notes (Signed)
NP Schorr return call with no new orders.

## 2015-04-06 ENCOUNTER — Other Ambulatory Visit: Payer: Self-pay | Admitting: Family Medicine

## 2015-04-06 NOTE — Telephone Encounter (Signed)
Refill request for oxycodone 15mg . LOV 02/19/2015. Please advise. Thanks!

## 2015-04-07 ENCOUNTER — Encounter: Payer: Self-pay | Admitting: Internal Medicine

## 2015-04-07 ENCOUNTER — Encounter (INDEPENDENT_AMBULATORY_CARE_PROVIDER_SITE_OTHER): Payer: Medicare Other | Admitting: Clinical

## 2015-04-07 ENCOUNTER — Ambulatory Visit (INDEPENDENT_AMBULATORY_CARE_PROVIDER_SITE_OTHER): Payer: Medicare Other | Admitting: Internal Medicine

## 2015-04-07 VITALS — BP 112/73 | HR 99 | Temp 99.2°F | Resp 20 | Ht 66.0 in | Wt 174.0 lb

## 2015-04-07 DIAGNOSIS — D571 Sickle-cell disease without crisis: Secondary | ICD-10-CM | POA: Diagnosis not present

## 2015-04-07 DIAGNOSIS — G8929 Other chronic pain: Secondary | ICD-10-CM

## 2015-04-07 DIAGNOSIS — M549 Dorsalgia, unspecified: Secondary | ICD-10-CM

## 2015-04-07 DIAGNOSIS — Z3490 Encounter for supervision of normal pregnancy, unspecified, unspecified trimester: Secondary | ICD-10-CM

## 2015-04-07 DIAGNOSIS — F32A Depression, unspecified: Secondary | ICD-10-CM

## 2015-04-07 DIAGNOSIS — Z331 Pregnant state, incidental: Secondary | ICD-10-CM | POA: Diagnosis not present

## 2015-04-07 DIAGNOSIS — F329 Major depressive disorder, single episode, unspecified: Secondary | ICD-10-CM

## 2015-04-07 MED ORDER — OXYCODONE HCL 15 MG PO TABS: 15.0000 mg | ORAL_TABLET | ORAL | Status: DC | PRN

## 2015-04-07 NOTE — Progress Notes (Signed)
ASSESSMENT: Pt currently experiencing moderate depression. Pt needs to f/u with PCP and [facility]; would benefit from psychoeducation and supportive counseling regarding coping with symptoms of depression and postpartum depression.  Stage of Change: contemplative  PLAN: 1. F/U with behavioral health consultant in one week via phone, one month office visit 2. Psychiatric Medications: none. 3. Behavioral recommendation(s):   -Consider reading educational material regarding coping with depression/postpartum depression  SUBJECTIVE: Pt. referred by Dr Doreene Burke for depression:  Pt. reports the following symptoms/concerns: Pt states that she is feeling depressed after finding out she is pregnant (about 8 weeks), as her son is about 79 months old. Pt uncertain what she will decide about pregnancy, has plans to open wig business soon, and [place] weighing pros and cons of another child.  Duration of problem: about a week Severity: moderate  OBJECTIVE: Orientation & Cognition: Oriented x3. Thought processes normal and appropriate to situation. Mood: appropriate. Affect: appropriate Appearance: appropriate Risk of harm to self or others: no known risk of harm to self or others Substance use: tobacco Assessments administered: PHQ9: 6  Diagnosis: Depression CPT Code: F32.9 -------------------------------------------- Other(s) present in the room: none  Time spent with patient in exam room: 16 minutes

## 2015-04-07 NOTE — Patient Instructions (Signed)
Sickle Cell Anemia, Adult Sickle cell anemia is a condition in which red blood cells have an abnormal "sickle" shape. This abnormal shape shortens the cells' life span, which results in a lower than normal concentration of red blood cells in the blood. The sickle shape also causes the cells to clump together and block free blood flow through the blood vessels. As a result, the tissues and organs of the body do not receive enough oxygen. Sickle cell anemia causes organ damage and pain and increases the risk of infection. CAUSES  Sickle cell anemia is a genetic disorder. Those who receive two copies of the gene have the condition, and those who receive one copy have the trait. RISK FACTORS The sickle cell gene is most common in people whose families originated in [place]. Other areas of the globe where sickle cell trait occurs include the Mediterranean, [place] and [place], the [place], and the [place].  SIGNS AND SYMPTOMS  Pain, especially in the extremities, back, chest, or abdomen (common). The pain may start suddenly or may develop following an illness, especially if there is dehydration. Pain can also occur due to overexertion or exposure to extreme temperature changes.  Frequent severe bacterial infections, especially certain types of pneumonia and meningitis.  Pain and swelling in the hands and feet.  Decreased activity.   Loss of appetite.   Change in behavior.  Headaches.  Seizures.  Shortness of breath or difficulty breathing.  Vision changes.  Skin ulcers. Those with the trait may not have symptoms or they may have mild symptoms.  DIAGNOSIS  Sickle cell anemia is diagnosed with blood tests that demonstrate the genetic trait. It is often diagnosed during the newborn period, due to mandatory testing nationwide. A variety of blood tests, X-rays, CT scans, MRI scans, ultrasounds, and lung function tests may also be done to monitor the condition. TREATMENT  Sickle  cell anemia may be treated with:  Medicines. You may be given pain medicines, antibiotic medicines (to treat and prevent infections) or medicines to increase the production of certain types of hemoglobin.  Fluids.  Oxygen.  Blood transfusions. HOME CARE INSTRUCTIONS   Drink enough fluid to keep your urine clear or pale yellow. Increase your fluid intake in hot weather and during exercise.  Do not smoke. Smoking lowers oxygen levels in the blood.   Only take over-the-counter or prescription medicines for pain, fever, or discomfort as directed by your health care provider.  Take antibiotics as directed by your health care provider. Make sure you finish them it even if you start to feel better.   Take supplements as directed by your health care provider.   Consider wearing a medical alert bracelet. This tells anyone caring for you in an emergency of your condition.   When traveling, keep your medical information, health care provider's names, and the medicines you take with you at all times.   If you develop a fever, do not take medicines to reduce the fever right away. This could cover up a problem that is developing. Notify your health care provider.  Keep all follow-up appointments with your health care provider. Sickle cell anemia requires regular medical care. SEEK MEDICAL CARE IF: You have a fever. SEEK IMMEDIATE MEDICAL CARE IF:   You feel dizzy or faint.   You have new abdominal pain, especially on the left side near the stomach area.   You develop a persistent, often uncomfortable and painful penile erection (priapism). If this is not treated immediately it   will lead to impotence.   You have numbness your arms or legs or you have a hard time moving them.   You have a hard time with speech.   You have a fever or persistent symptoms for more than 2-3 days.   You have a fever and your symptoms suddenly get worse.   You have signs or symptoms of infection.  These include:   Chills.   Abnormal tiredness (lethargy).   Irritability.   Poor eating.   Vomiting.   You develop pain that is not helped with medicine.   You develop shortness of breath.  You have pain in your chest.   You are coughing up pus-like or bloody sputum.   You develop a stiff neck.  Your feet or hands swell or have pain.  Your abdomen appears bloated.  You develop joint pain. MAKE SURE YOU:  Understand these instructions.   This information is not intended to replace advice given to you by your health care provider. Make sure you discuss any questions you have with your health care provider.   Document Released: 08/10/2005 Document Revised: 05/23/2014 Document Reviewed: 12/12/2012 Elsevier Interactive Patient Education 2016 [employer]  

## 2015-04-07 NOTE — Progress Notes (Signed)
Patient here for F/U Patient complains of pain scaled at a 4. Patient is 8 weeks and 4 days pregnant.  Refill on Oxycodone.

## 2015-04-07 NOTE — Progress Notes (Signed)
Patient ID: [name], female   DOB: November 13, 1990, 24 y.o.   MRN: [id number]   [name], is a 24 y.o. female  [id number]  [id number]  DOB - Apr 24, 1991  Chief Complaint  Patient presents with  . Follow-up        Subjective:   [name] is a 24 y.o. female with history of sickle cell disease, depression, GERD, migraine headaches and chronic back pain here today for a follow up visit. Patient was recently been admitted for hemoglobin sickle cell pain crisis on 03/30/2015. She has no new complaints today. Her pain is at a 4 out of 10 which is her baseline. She is currently 8 weeks and 4 days per week pregnant, she is contemplating termination but not completely sure, she has a gynecologist appointment coming up. Patient claims compliant with her medications including folic acid, she is in need of refill of her pain medication. Patient has No headache, No chest pain, No abdominal pain - No Nausea, No new weakness tingling or numbness, No Cough - SOB.  No problems updated.  ALLERGIES: Allergies  Allergen Reactions  . Carrot [Daucus Carota] Hives, Swelling and Rash    Dietary:  Please do not send any form of carrot to patient  . Carrot Oil Hives and Swelling  . Latex Rash    *powered Latex*     PAST MEDICAL HISTORY: Past Medical History  Diagnosis Date  . Miscarriage 03/22/2011    Pt reports 2 miscarriages.  . Depression 01/06/2011  . GERD (gastroesophageal reflux disease) 02/17/2011  . Trichotillomania     h/o  . Blood transfusion     "lots"  . Sickle cell anemia with crisis ([place])   . Exertional dyspnea     "sometimes"  . Migraines 11/08/11    "@ least twice/month"  . Chronic back pain     "very severe; have knot in my back; from tight muscle; take RX and exercise for it"  . Mood swings ([place]) 11/08/11    "I go back and forth; real bad"  . Sickle cell anemia ([place])     MEDICATIONS AT HOME: Prior to Admission medications   Medication Sig Start Date  End Date Taking? Authorizing Provider  acetaminophen (TYLENOL) 500 MG tablet Take 1,000 mg by mouth 2 (two) times daily as needed for moderate pain or headache.   Yes Historical Provider, MD  folic acid (FOLVITE) 1 MG tablet Take 2 tablets (2 mg total) by mouth daily. 03/26/15  Yes [name], MD  methadone (DOLOPHINE) 5 MG tablet Take 1 tablet (5 mg total) by mouth at bedtime. 03/24/15  Yes Chrishawn Boley Essie Christine, MD  oxyCODONE (ROXICODONE) 15 MG immediate release tablet Take 1 tablet (15 mg total) by mouth every 4 (four) hours as needed for pain. 04/07/15  Yes Tresa Garter, MD  aspirin-acetaminophen-caffeine (EXCEDRIN MIGRAINE) [phone] MG tablet Take 1 tablet by mouth daily as needed for headache.    Historical Provider, MD  hydroxyurea (HYDREA) 500 MG capsule Take 500 mg by mouth daily. May take with food to minimize GI side effects.    Historical Provider, MD     Objective:   Filed Vitals:   04/07/15 1026  BP: 112/73  Pulse: 99  Temp: 99.2 F (37.3 C)  TempSrc: Oral  Resp: 20  Height: 5\' 6"  (1.676 m)  Weight: 174 lb (78.926 kg)  SpO2: 100%    Exam General appearance : Awake, alert, not in any distress. Speech Clear. Not toxic looking  HEENT: Atraumatic and Normocephalic, pupils equally reactive to light and accomodation Neck: supple, no JVD. No cervical lymphadenopathy.  Chest:Good air entry bilaterally, no added sounds  CVS: S1 S2 regular, no murmurs.  Abdomen: Bowel sounds present, Non tender and not distended with no gaurding, rigidity or rebound. Extremities: B/L Lower Ext shows no edema, both legs are warm to touch Neurology: Awake alert, and oriented X 3, CN II-XII intact, Non focal Skin:No Rash  Data Review No results found for: HGBA1C   Assessment & Plan   1. Hb-SS disease without crisis ([place]) Refill - oxyCODONE (ROXICODONE) 15 MG immediate release tablet; Take 1 tablet (15 mg total) by mouth every 4 (four) hours as needed for pain.  Dispense: 90  tablet; Refill: 0  2. Pregnancy at early stage  - Patient is currently contemplating termination, she has appointment with gynecologist coming up early December. - She is currently nursing a 38-month-old baby  3. Chronic back pain  - oxyCODONE (ROXICODONE) 15 MG immediate release tablet; Take 1 tablet (15 mg total) by mouth every 4 (four) hours as needed for pain.  Dispense: 90 tablet; Refill: 0  Patient have been counseled extensively about nutrition and exercise  Return in about 4 weeks (around 05/05/2015) for Sickle Cell Disease/Pain.  The patient was given clear instructions to go to ER or return to medical center if symptoms don't improve, worsen or new problems develop. The patient verbalized understanding. The patient was told to call to get lab results if they haven't heard anything in the next week.   This note has been created with Surveyor, quantity. Any transcriptional errors are unintentional.    Angelica Chessman, MD, [place], [place], [place], [place] and [facility] [place], [place]   04/07/2015, 11:01 AM

## 2015-04-14 ENCOUNTER — Encounter (HOSPITAL_COMMUNITY): Payer: Self-pay | Admitting: *Deleted

## 2015-04-14 ENCOUNTER — Encounter (HOSPITAL_COMMUNITY): Payer: Self-pay | Admitting: Emergency Medicine

## 2015-04-14 ENCOUNTER — Inpatient Hospital Stay (HOSPITAL_COMMUNITY)
Admission: AD | Admit: 2015-04-14 | Discharge: 2015-04-22 | DRG: 781 | Disposition: A | Discharge status: Home / Self Care | Payer: Medicare Other | Attending: Internal Medicine | Admitting: Internal Medicine

## 2015-04-14 ENCOUNTER — Emergency Department (HOSPITAL_COMMUNITY)
Admission: EM | Admit: 2015-04-14 | Discharge: 2015-04-14 | Disposition: A | Discharge status: Home / Self Care | Payer: Medicare Other | Source: Home / Self Care | Attending: Emergency Medicine | Admitting: Emergency Medicine

## 2015-04-14 DIAGNOSIS — Z91040 Latex allergy status: Secondary | ICD-10-CM

## 2015-04-14 DIAGNOSIS — G43909 Migraine, unspecified, not intractable, without status migrainosus: Secondary | ICD-10-CM | POA: Insufficient documentation

## 2015-04-14 DIAGNOSIS — D5700 Hb-SS disease with crisis, unspecified: Secondary | ICD-10-CM | POA: Diagnosis present

## 2015-04-14 DIAGNOSIS — Z452 Encounter for adjustment and management of vascular access device: Secondary | ICD-10-CM | POA: Diagnosis not present

## 2015-04-14 DIAGNOSIS — Z79899 Other long term (current) drug therapy: Secondary | ICD-10-CM

## 2015-04-14 DIAGNOSIS — R51 Headache: Secondary | ICD-10-CM | POA: Diagnosis not present

## 2015-04-14 DIAGNOSIS — Z8719 Personal history of other diseases of the digestive system: Secondary | ICD-10-CM | POA: Insufficient documentation

## 2015-04-14 DIAGNOSIS — G8929 Other chronic pain: Secondary | ICD-10-CM

## 2015-04-14 DIAGNOSIS — D72829 Elevated white blood cell count, unspecified: Secondary | ICD-10-CM | POA: Diagnosis not present

## 2015-04-14 DIAGNOSIS — Z87891 Personal history of nicotine dependence: Secondary | ICD-10-CM

## 2015-04-14 DIAGNOSIS — K5909 Other constipation: Secondary | ICD-10-CM | POA: Diagnosis not present

## 2015-04-14 DIAGNOSIS — D638 Anemia in other chronic diseases classified elsewhere: Secondary | ICD-10-CM | POA: Diagnosis not present

## 2015-04-14 DIAGNOSIS — Z8249 Family history of ischemic heart disease and other diseases of the circulatory system: Secondary | ICD-10-CM

## 2015-04-14 DIAGNOSIS — O99011 Anemia complicating pregnancy, first trimester: Principal | ICD-10-CM | POA: Diagnosis present

## 2015-04-14 DIAGNOSIS — Z9114 Patient's other noncompliance with medication regimen: Secondary | ICD-10-CM

## 2015-04-14 DIAGNOSIS — Z331 Pregnant state, incidental: Secondary | ICD-10-CM | POA: Diagnosis not present

## 2015-04-14 DIAGNOSIS — R519 Headache, unspecified: Secondary | ICD-10-CM

## 2015-04-14 DIAGNOSIS — M79661 Pain in right lower leg: Secondary | ICD-10-CM | POA: Diagnosis not present

## 2015-04-14 DIAGNOSIS — Z833 Family history of diabetes mellitus: Secondary | ICD-10-CM | POA: Diagnosis not present

## 2015-04-14 DIAGNOSIS — G43709 Chronic migraine without aura, not intractable, without status migrainosus: Secondary | ICD-10-CM | POA: Diagnosis present

## 2015-04-14 DIAGNOSIS — Z8659 Personal history of other mental and behavioral disorders: Secondary | ICD-10-CM

## 2015-04-14 DIAGNOSIS — M79604 Pain in right leg: Secondary | ICD-10-CM

## 2015-04-14 DIAGNOSIS — W19XXXA Unspecified fall, initial encounter: Secondary | ICD-10-CM

## 2015-04-14 MED ORDER — HYDROMORPHONE 1 MG/ML IV SOLN
INTRAVENOUS | Status: DC
  Administered 2015-04-14: 16:00:00 via INTRAVENOUS
  Filled 2015-04-14: qty 25

## 2015-04-14 MED ORDER — NALOXONE HCL 0.4 MG/ML IJ SOLN: 0.4000 mg | INTRAMUSCULAR | Status: DC | PRN

## 2015-04-14 MED ORDER — HYDROMORPHONE HCL 2 MG/ML IJ SOLN
2.0000 mg | Freq: Once | INTRAMUSCULAR | Status: AC
  Administered 2015-04-14: 2 mg via INTRAVENOUS
  Filled 2015-04-14: qty 1

## 2015-04-14 MED ORDER — SENNOSIDES-DOCUSATE SODIUM 8.6-50 MG PO TABS: 1.0000 | ORAL_TABLET | Freq: Two times a day (BID) | ORAL | Status: DC

## 2015-04-14 MED ORDER — DIPHENHYDRAMINE HCL 12.5 MG/5ML PO ELIX: 25.0000 mg | ORAL_SOLUTION | Freq: Four times a day (QID) | ORAL | Status: DC | PRN

## 2015-04-14 MED ORDER — SODIUM CHLORIDE 0.9 % IJ SOLN: 9.0000 mL | INTRAMUSCULAR | Status: DC | PRN

## 2015-04-14 MED ORDER — HYDROMORPHONE 1 MG/ML IV SOLN
INTRAVENOUS | Status: DC
  Administered 2015-04-14: 11.2 mg via INTRAVENOUS
  Administered 2015-04-15: 5.2 mg via INTRAVENOUS
  Administered 2015-04-15: 5.4 mg via INTRAVENOUS
  Administered 2015-04-15: 4 mg via INTRAVENOUS
  Filled 2015-04-14: qty 25

## 2015-04-14 MED ORDER — HYDROMORPHONE 1 MG/ML IV SOLN: INTRAVENOUS | Status: DC

## 2015-04-14 MED ORDER — DIPHENHYDRAMINE HCL 25 MG PO CAPS: 25.0000 mg | ORAL_CAPSULE | ORAL | Status: DC | PRN

## 2015-04-14 MED ORDER — SODIUM CHLORIDE 0.9 % IV SOLN
12.5000 mg | Freq: Four times a day (QID) | INTRAVENOUS | Status: DC | PRN
  Filled 2015-04-14: qty 0.25

## 2015-04-14 MED ORDER — DEXTROSE-NACL 5-0.45 % IV SOLN
INTRAVENOUS | Status: DC
  Administered 2015-04-15 – 2015-04-20 (×6): via INTRAVENOUS

## 2015-04-14 MED ORDER — SENNOSIDES-DOCUSATE SODIUM 8.6-50 MG PO TABS
1.0000 | ORAL_TABLET | Freq: Two times a day (BID) | ORAL | Status: DC
  Administered 2015-04-14 – 2015-04-22 (×10): 1 via ORAL
  Filled 2015-04-14 (×11): qty 1

## 2015-04-14 MED ORDER — SODIUM CHLORIDE 0.9 % IV SOLN
25.0000 mg | Freq: Four times a day (QID) | INTRAVENOUS | Status: DC | PRN
  Administered 2015-04-16: 25 mg via INTRAVENOUS
  Filled 2015-04-14 (×2): qty 0.5

## 2015-04-14 MED ORDER — DEXTROSE-NACL 5-0.45 % IV SOLN
INTRAVENOUS | Status: DC
  Administered 2015-04-14: 16:00:00 via INTRAVENOUS

## 2015-04-14 MED ORDER — HYDROMORPHONE HCL 2 MG/ML IJ SOLN
2.0000 mg | Freq: Once | INTRAMUSCULAR | Status: AC
  Administered 2015-04-14: 2 mg via SUBCUTANEOUS
  Filled 2015-04-14: qty 1

## 2015-04-14 MED ORDER — HYDROMORPHONE HCL 2 MG/ML IJ SOLN: 2.0000 mg | Freq: Once | INTRAMUSCULAR | Status: DC

## 2015-04-14 MED ORDER — POLYETHYLENE GLYCOL 3350 17 G PO PACK: 17.0000 g | PACK | Freq: Every day | ORAL | Status: DC | PRN

## 2015-04-14 MED ORDER — FOLIC ACID 1 MG PO TABS: 1.0000 mg | ORAL_TABLET | Freq: Every day | ORAL | Status: DC

## 2015-04-14 MED ORDER — SODIUM CHLORIDE 0.9 % IV SOLN
25.0000 mg | INTRAVENOUS | Status: DC | PRN
  Filled 2015-04-14: qty 0.5

## 2015-04-14 MED ORDER — DIPHENHYDRAMINE HCL 12.5 MG/5ML PO ELIX
12.5000 mg | ORAL_SOLUTION | Freq: Four times a day (QID) | ORAL | Status: DC | PRN
  Administered 2015-04-14: 12.5 mg via ORAL
  Filled 2015-04-14: qty 5

## 2015-04-14 MED ORDER — ONDANSETRON HCL 4 MG/2ML IJ SOLN
4.0000 mg | Freq: Four times a day (QID) | INTRAMUSCULAR | Status: DC | PRN
  Administered 2015-04-17 – 2015-04-20 (×3): 4 mg via INTRAVENOUS
  Filled 2015-04-14 (×3): qty 2

## 2015-04-14 MED ORDER — HYDROMORPHONE HCL 2 MG/ML IJ SOLN
1.0000 mg | INTRAMUSCULAR | Status: DC | PRN
  Administered 2015-04-14 – 2015-04-15 (×3): 1 mg via INTRAVENOUS
  Filled 2015-04-14: qty 1

## 2015-04-14 MED ORDER — ONDANSETRON HCL 4 MG/2ML IJ SOLN
4.0000 mg | Freq: Four times a day (QID) | INTRAMUSCULAR | Status: DC | PRN
  Administered 2015-04-14: 4 mg via INTRAVENOUS
  Filled 2015-04-14: qty 2

## 2015-04-14 MED ORDER — ENOXAPARIN SODIUM 40 MG/0.4ML ~~LOC~~ SOLN
40.0000 mg | SUBCUTANEOUS | Status: DC
  Administered 2015-04-14: 30 mg via SUBCUTANEOUS
  Administered 2015-04-15 – 2015-04-21 (×7): 40 mg via SUBCUTANEOUS
  Filled 2015-04-14 (×9): qty 0.4

## 2015-04-14 NOTE — ED Notes (Signed)
Attempt IV placement (and blood draw) in left AC without success.  2nd RN notified

## 2015-04-14 NOTE — Discharge Instructions (Signed)
You are getting transferred over to the sickle cell clinic for ongoing management of your pain crisis.   Sickle Cell Anemia, Adult Sickle cell anemia is a condition where your red blood cells are shaped like sickles. Red blood cells carry oxygen through the body. Sickle-shaped red blood cells do not live as long as normal red blood cells. They also clump together and block blood from flowing through the blood vessels. These things prevent the body from getting enough oxygen. Sickle cell anemia causes organ damage and pain. It also increases the risk of infection. HOME CARE  Drink enough fluid to keep your pee (urine) clear or pale yellow. Drink more in hot weather and during exercise.  Do not smoke. Smoking lowers oxygen levels in the blood.  Only take over-the-counter or prescription medicines as told by your doctor.  Take antibiotic medicines as told by your doctor. Make sure you finish them even if you start to feel better.  Take supplements as told by your doctor.  Consider wearing a medical alert bracelet. This tells anyone caring for you in an emergency of your condition.  When traveling, keep your medical information, doctors' names, and the medicines you take with you at all times.  If you have a fever, do not take fever medicines right away. This could cover up a problem. Tell your doctor.   Keep all follow-up visits with your doctor. Sickle cell anemia requires regular medical care. GET HELP IF: You have a fever. GET HELP RIGHT AWAY IF:  You feel dizzy or faint.  You have new belly (abdominal) pain, especially on the left side near the stomach area.  You have a lasting, often uncomfortable and painful erection of the penis (priapism). If it is not treated right away, you will become unable to have sex (impotence).  You have numbness in your arms or legs or you have a hard time moving them.  You have a hard time talking.  You have a fever or lasting symptoms for more  than 2-3 days.  You have a fever and your symptoms suddenly get worse.  You have signs or symptoms of infection. These include:  Chills.  Being more tired than normal (lethargy).  Irritability.  Poor eating.  Throwing up (vomiting).  You have pain that is not helped with medicine.  You have shortness of breath.  You have pain in your chest.  You are coughing up pus-like or bloody mucus.  You have a stiff neck.  Your feet or hands swell or have pain.  Your belly looks bloated.  Your joints hurt. MAKE SURE YOU:  Understand these instructions.  Will watch your condition.  Will get help right away if you are not doing well or get worse.   This information is not intended to replace advice given to you by your health care provider. Make sure you discuss any questions you have with your health care provider.   Document Released: 02/20/2013 Document Revised: 09/16/2014 Document Reviewed: 02/20/2013 Elsevier Interactive Patient Education [employer].

## 2015-04-14 NOTE — ED Provider Notes (Signed)
CSN: [id number]     Arrival date & time 04/14/15  [id number] History   First MD Initiated Contact with Patient 04/14/15 [phone]     Chief Complaint  Patient presents with  . Sickle Cell Pain Crisis     (Consider location/radiation/quality/duration/timing/severity/associated sxs/prior Treatment) HPI 24 year old female who presents with right leg pain. History of sickle cell anemia/hemoglobin SS. I was recently admitted into the hospital early November for management of sickle cell crises involving her right lower extremity. States that she felt well upon discharge, but this morning woke up at 5 AM with severe right leg pain, similar to prior episode of sickle cell crisis. Took single dose of oxycodone, with no significant relief of symptoms. Subsequently presents the ED for evaluation. Denies any recent falls, numbness or weakness, chest pain, difficulty breathing, abdominal pain, nausea, vomiting, or fever. Denies leg swelling and states she was ruled out for DVT in that leg in setting of this pain this month.   Past Medical History  Diagnosis Date  . Miscarriage 03/22/2011    Pt reports 2 miscarriages.  . Depression 01/06/2011  . GERD (gastroesophageal reflux disease) 02/17/2011  . Trichotillomania     h/o  . Blood transfusion     "lots"  . Sickle cell anemia with crisis ([place])   . Exertional dyspnea     "sometimes"  . Migraines 11/08/11    "@ least twice/month"  . Chronic back pain     "very severe; have knot in my back; from tight muscle; take RX and exercise for it"  . Mood swings ([place]) 11/08/11    "I go back and forth; real bad"  . Sickle cell anemia [facility]    Past Surgical History  Procedure Laterality Date  . Cholecystectomy  05/2010  . Dilation and curettage of uterus  02/20/11    S/P miscarriage   Family History  Problem Relation Age of Onset  . Diabetes Maternal Grandmother   . Diabetes Paternal Grandmother   . Hypertension Paternal Grandmother   . Diabetes Maternal Grandfather    . Sickle cell trait Mother   . Sickle cell trait Father    Social History  Substance Use Topics  . Smoking status: Former Smoker -- 0.25 packs/day for 1 years    Types: Cigarettes    Quit date: 03/25/2013  . Smokeless tobacco: Never Used  . Alcohol Use: No     Comment: pt states she quit marijuan in May 2013. Rare ETOH, + cigarettes.  She is enrolled in school   OB History    Gravida Para Term Preterm AB TAB SAB Ectopic Multiple Living   3 1 1  1  1   0 1      Obstetric Comments   Miscarried in October 2012 at about 7 weeks     Review of Systems 10/14 systems reviewed and are negative other than those stated in the HPI    Allergies  Carrot; Carrot oil; and Latex  Home Medications   Prior to Admission medications   Medication Sig Start Date End Date Taking? Authorizing Provider  aspirin-acetaminophen-caffeine (EXCEDRIN MIGRAINE) [id number] MG tablet Take 1 tablet by mouth daily as needed for headache.   Yes Historical Provider, MD  hydroxyurea (HYDREA) 500 MG capsule Take 500 mg by mouth daily. May take with food to minimize GI side effects.   Yes Historical Provider, MD  methadone (DOLOPHINE) 5 MG tablet Take 1 tablet (5 mg total) by mouth at bedtime. 03/24/15  Yes Tresa Garter, MD  oxyCODONE (ROXICODONE) 15 MG immediate release tablet Take 1 tablet (15 mg total) by mouth every 4 (four) hours as needed for pain. 04/07/15  Yes Tresa Garter, MD  folic acid (FOLVITE) 1 MG tablet Take 2 tablets (2 mg total) by mouth daily. Patient not taking: Reported on 04/14/2015 03/26/15   [name], MD   BP 113/66 mmHg  Pulse 91  Temp(Src) 98.5 F (36.9 C) (Oral)  Resp 18  SpO2 95%  LMP 02/09/2015 Physical Exam Physical Exam  Nursing note and vitals reviewed. Constitutional: Well developed, well nourished, non-toxic, and in no acute distress Head: Normocephalic and atraumatic.  Mouth/Throat: Oropharynx is clear and moist.  Neck: Normal range of motion. Neck  supple.  Cardiovascular: Normal rate and regular rhythm.   Pulmonary/Chest: Effort normal and breath sounds normal.  Abdominal: Soft. There is no tenderness. There is no rebound and no guarding.  Musculoskeletal: Normal range of motion. No leg swelling or deformity. With mild touch of her skin, states severe pain. Neurological: Alert, no facial droop, fluent speech, moves all extremities symmetrically Skin: Skin is warm and dry.  Psychiatric: Cooperative  ED Course  Procedures (including critical care time) Labs Review Labs Reviewed  COMPREHENSIVE METABOLIC PANEL  CBC WITH DIFFERENTIAL/PLATELET    I have personally reviewed and evaluated these images and lab results as part of my medical decision-making.    MDM   Final diagnoses:  Pain of right lower extremity  Hb-SS disease with crisis [facility])    24 year old female with hemoglobin SS who presents with right leg pain, similar to pain associated with her prior sickle cell crises. Is well-appearing in no acute distress. Vital signs are not concerning. She is neurovascularly intact right lower extremity. Recently ruled out for DVT in this leg in the setting of pain. By history and exam, there is no concern for serious sequela of sickle cell, such as infection, ACS, intracranial processes, or other acute processes. Discussed with Dr. Lonia Chimera andede from sickle cell clinic regarding management in the ED. States to transfer her to sickle cell clinic where they will manage her symptoms.   MSE was initiated and I personally evaluated the patient at  10:22 AM on April 14, 2015.  The patient appears stable. Felt that she was stable for transfer to the sickle cell clinic for ongoing management for pain crisis.   Forde Dandy, MD 04/14/15 1022

## 2015-04-14 NOTE — ED Notes (Signed)
Bed: WA03 Expected date:  Expected time:  Means of arrival:  Comments: 

## 2015-04-14 NOTE — H&P (Signed)
[facility] History and Physical  [name] [id number] DOB: 1990-07-30 DOA: 04/14/2015  PCP: Angelica Chessman, MD   Chief Complaint: No chief complaint on file.   HPI: [name] is a 24 y.o. female with history of sickle cell disease, chronic pain, opioid tolerant, major depression, GERD and chronic migraine headaches was initially seen and evaluated in the ED this morning, and sent to the day hospital for extended observation and pain management. Patient was admitted recently to the hospital for intractable pain, discharged within the last week. Patient claims her pain as a 10 out of 10, mostly on her right leg and knee joints, she denies any fever, she denies headache or blurry vision. Patient is currently in the early stage of pregnancy. Patient claims compliant with her medications, but has been asking series of questions concerning her dosage, she wants her methadone increased from 5-10 mg and/or increase her oxycodone 15 mg to one and half tablets every 4 hours. 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 She denies any chills, no runny nose, no productive cough, denies abdominal pain, no nausea, no vomiting, no diarrhea. She denies any urinary symptoms.  Systemic Review: General: The patient denies anorexia, fever, weight loss Cardiac: Denies chest pain, syncope, palpitations, pedal edema  Respiratory: Denies cough, shortness of breath, wheezing GI: Denies severe indigestion/heartburn, abdominal pain, nausea, vomiting, diarrhea and constipation GU: Denies hematuria, incontinence, dysuria  Musculoskeletal: Denies arthritis  Skin: Denies suspicious skin lesions Neurologic: Denies focal weakness or numbness, change in vision  Past Medical History  Diagnosis Date  . Miscarriage 03/22/2011    Pt reports 2 miscarriages.  . Depression 01/06/2011  . GERD (gastroesophageal reflux disease) 02/17/2011  .  Trichotillomania     h/o  . Blood transfusion     "lots"  . Sickle cell anemia with crisis ([place])   . Exertional dyspnea     "sometimes"  . Migraines 11/08/11    "@ least twice/month"  . Chronic back pain     "very severe; have knot in my back; from tight muscle; take RX and exercise for it"  . Mood swings ([place]) 11/08/11    "I go back and forth; real bad"  . Sickle cell anemia [facility])     Past Surgical History  Procedure Laterality Date  . Cholecystectomy  05/2010  . Dilation and curettage of uterus  02/20/11    S/P miscarriage    Allergies  Allergen Reactions  . Carrot [Daucus Carota] Hives, Swelling and Rash    Dietary:  Please do not send any form of carrot to patient  . Carrot Oil Hives and Swelling  . Latex Rash    *powered Latex*     Family History  Problem Relation Age of Onset  . Diabetes Maternal Grandmother   . Diabetes Paternal Grandmother   . Hypertension Paternal Grandmother   . Diabetes Maternal Grandfather   . Sickle cell trait Mother   . Sickle cell trait Father       Prior to Admission medications   Medication Sig Start Date End Date Taking? Authorizing Provider  aspirin-acetaminophen-caffeine (EXCEDRIN MIGRAINE) [phone] MG tablet Take 1 tablet by mouth daily as needed for headache.   Yes Historical Provider, MD  hydroxyurea (HYDREA) 500 MG capsule Take 500 mg by mouth daily. May take with food to minimize GI side effects.   Yes Historical Provider, MD  methadone (DOLOPHINE) 5 MG tablet Take  1 tablet (5 mg total) by mouth at bedtime. 03/24/15  Yes Zackery Brine Essie Christine, MD  oxyCODONE (ROXICODONE) 15 MG immediate release tablet Take 1 tablet (15 mg total) by mouth every 4 (four) hours as needed for pain. 04/07/15  Yes Tresa Garter, MD  folic acid (FOLVITE) 1 MG tablet Take 2 tablets (2 mg total) by mouth daily. Patient not taking: Reported on 04/14/2015 03/26/15   [name], MD     Physical Exam: Filed Vitals:   04/14/15 1105  BP:  107/47  Pulse: 82  Temp: 99.3 F (37.4 C)  Resp: 20  Height: 5\' 1"  (1.549 m)  Weight: 175 lb (79.379 kg)  SpO2: 100%    General: Alert, awake, afebrile, anicteric, not in obvious distress HEENT: Normocephalic and Atraumatic, Mucous membranes pink                PERRLA; EOM intact; No scleral icterus,                 Nares: Patent, Oropharynx: Clear, Fair Dentition                 Neck: FROM, no cervical lymphadenopathy, thyromegaly, carotid bruit or JVD;  CHEST WALL: No tenderness  CHEST: Normal respiration, clear to auscultation bilaterally  HEART: Regular rate and rhythm; no murmurs rubs or gallops  BACK: No kyphosis or scoliosis; no CVA tenderness  ABDOMEN: Positive Bowel Sounds, soft, non-tender; no masses, no organomegaly Rectal Exam: deferred EXTREMITIES: No cyanosis, clubbing, or edema SKIN:  no rash or ulceration  CNS: Alert and Oriented x 4, Nonfocal exam, CN 2-12 intact  Labs on Admission:  Basic Metabolic Panel: No results for input(s): NA, K, CL, CO2, GLUCOSE, BUN, CREATININE, CALCIUM, MG, PHOS in the last 168 hours. Liver Function Tests: No results for input(s): AST, ALT, ALKPHOS, BILITOT, PROT, ALBUMIN in the last 168 hours. No results for input(s): LIPASE, AMYLASE in the last 168 hours. No results for input(s): AMMONIA in the last 168 hours. CBC: No results for input(s): WBC, NEUTROABS, HGB, HCT, MCV, PLT in the last 168 hours. Cardiac Enzymes: No results for input(s): CKTOTAL, CKMB, CKMBINDEX, TROPONINI in the last 168 hours.  BNP (last 3 results) No results for input(s): BNP in the last 8760 hours.  ProBNP (last 3 results) No results for input(s): PROBNP in the last 8760 hours.  CBG: No results for input(s): GLUCAP in the last 168 hours.   Assessment/Plan Active Problems:   Sickle cell anemia with pain ([place])   Admits to the Day Hospital  IVF D5 .45% Saline @ 125 mls/hour  IV Dilaudid loading dose given  Weight based Dilaudid PCA started  within 30 minutes of admission  IV Toradol Not Given because of early pregnancy  Monitor vitals very closely, Re-evaluate pain scale every hour  Oxygen by   Patient will be re-evaluated for pain in the context of function and relationship to baseline as care progresses.  If no significant relieve from pain (remains above 5/10) will transfer patient to inpatient services for further evaluation and management  Code Status: Full  Family Communication: None  DVT Prophylaxis: Ambulate as tolerated   Time spent: 21 Minutes  Bereket Gernert, MD, MHA, FACP, FAAP, CPE  If 7PM-7AM, please contact night-coverage [email or web address] 04/14/2015, 11:54 AM

## 2015-04-14 NOTE — Discharge Summary (Signed)
Physician Discharge Summary  [name] [id number] DOB: December 04, 1990 DOA: 04/14/2015  PCP: Angelica Chessman, MD  Admit date: 04/14/2015  Discharge date: 04/14/2015  Time spent: 30 minutes  Discharge Diagnoses:  Active Problems:   Sickle cell anemia with pain [facility])   Discharge Condition: Stable  Diet recommendation: Regular  Filed Weights   04/14/15 1105  Weight: 175 lb (79.379 kg)    History of present illness:  HPI: [name] is a 24 y.o. female with history of sickle cell disease, chronic pain, opioid tolerant, major depression, GERD and chronic migraine headaches was initially seen and evaluated in the ED this morning, and sent to the day hospital for extended observation and pain management. Patient was admitted recently to the hospital for intractable pain, discharged within the last week. Patient claims her pain as a 10 out of 10, mostly on her right leg and knee joints, she denies any fever, she denies headache or blurry vision. Patient is currently in the early stage of pregnancy. Patient claims compliant with her medications, but has been asking series of questions concerning her dosage, she wants her methadone increased from 5-10 mg and/or increase her oxycodone 15 mg to one and half tablets every 4 hours. 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 She denies any chills, no runny nose, no productive cough, denies abdominal pain, no nausea, no vomiting, no diarrhea. She denies any urinary symptoms.  Hospital Course:  MAETTA BRANDSTETTER was admitted to the day hospital with sickle cell painful crisis. Patient was treated with weight based IV Dilaudid pushes and PCA, and IV fluids. Shuntel showed no significant improvement symptomatically, she ambulates with a limp, she still rates her pain at 8/10, she desires to be admitted overnight for continuous IV fluid and pain management. Patient will be admitted to  inpatient MedSurg floor.  Filed Vitals:   04/14/15 1540 04/14/15 1624  BP:  138/79  Pulse:  100  Temp:  99.4 F (37.4 C)  Resp: 16 16    General appearance: alert, cooperative and no distress Eyes: conjunctivae/corneas clear. PERRL, EOM's intact. Fundi benign. Neck: no adenopathy, no carotid bruit, no JVD, supple, symmetrical, trachea midline and thyroid not enlarged, symmetric, no tenderness/mass/nodules Back: symmetric, no curvature. ROM normal. No CVA tenderness. Resp: clear to auscultation bilaterally Chest wall: no tenderness Cardio: regular rate and rhythm, S1, S2 normal, no murmur, click, rub or gallop GI: soft, non-tender; bowel sounds normal; no masses, no organomegaly Extremities: extremities normal, atraumatic, no cyanosis or edema Pulses: 2+ and symmetric Skin: Skin color, texture, turgor normal. No rashes or lesions Neurologic: Grossly normal   Current Discharge Medication List    CONTINUE these medications which have NOT CHANGED   Details  aspirin-acetaminophen-caffeine (EXCEDRIN MIGRAINE) 250-250-65 MG tablet Take 1 tablet by mouth daily as needed for headache.    hydroxyurea (HYDREA) 500 MG capsule Take 500 mg by mouth daily. May take with food to minimize GI side effects.    methadone (DOLOPHINE) 5 MG tablet Take 1 tablet (5 mg total) by mouth at bedtime. Qty: 30 tablet, Refills: 0   Associated Diagnoses: Chronic pain    oxyCODONE (ROXICODONE) 15 MG immediate release tablet Take 1 tablet (15 mg total) by mouth every 4 (four) hours as needed for pain. Qty: 90 tablet, Refills: 0   Associated Diagnoses: Hb-SS disease without crisis ([place]); Chronic back pain    folic acid (FOLVITE) 1 MG tablet Take 2  tablets (2 mg total) by mouth daily. Qty: 60 tablet, Refills: 2   Associated Diagnoses: Pregnancy at early stage; Hb-SS disease without crisis ([place])       Allergies  Allergen Reactions  . Carrot [Daucus Carota] Hives, Swelling and Rash    Dietary:  Please do  not send any form of carrot to patient  . Carrot Oil Hives and Swelling  . Latex Rash    *powered Latex*      Significant Diagnostic Studies: No results found.  Signed:  Angelica Chessman MD, [place], [place], [place], CPE   04/14/2015, 5:06 PM
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
## 2015-04-14 NOTE — Progress Notes (Signed)
Patient ID: [name], female   DOB: September 07, 1990, 24 y.o.   MRN: [id number] Patient still with pain to right foot.  C/O pain at 10/10 without improvement after IV pain medication.  Belongings returned to patient.  Report called to Kim on 3W.

## 2015-04-14 NOTE — H&P (Signed)
[facility] History and Physical  [name] [id number] DOB: 1990/08/24 DOA: 04/14/2015  PCP: Angelica Chessman, MD   Chief Complaint: No chief complaint on file.   HPI: [name] is a 24 y.o. female with history of sickle cell disease, chronic pain, opioid tolerant, major depression, GERD and chronic migraine headaches was initially seen and evaluated in the ED this morning, and sent to the day hospital for extended observation and pain management. Patient was admitted recently to the hospital for intractable pain, discharged within the last week. Patient claims her pain as a 10 out of 10, mostly on her right leg and knee joints, she denies any fever, she denies headache or blurry vision. Patient is currently in the early stage of pregnancy. Patient claims compliant with her medications, but has been asking series of questions concerning her dosage, she wants her methadone increased from 5-10 mg and/or increase her oxycodone 15 mg to one and half tablets every 4 hours. 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 She denies any chills, no runny nose, no productive cough, denies abdominal pain, no nausea, no vomiting, no diarrhea. She denies any urinary symptoms.  [name] was admitted to the day hospital with sickle cell painful crisis. She had very difficulty in establishing IV access, the IV team from the hospital could not establish a line. She had IV line on the left leg after failed trials. She was treated with rapid dosing dilaudid IV, weight based IV Dilaudid and PCA, and 2 L of IV fluids D5.45 Saline. [name] showed no significant improvement symptomatically, she ambulates with a limp, she still rates her pain at 8/10, she desires to be admitted overnight for continuous IV fluid and pain management. Patient will be admitted to inpatient MedSurg floor.  Systemic Review: General: The patient denies  anorexia, fever, weight loss Cardiac: Denies chest pain, syncope, palpitations, pedal edema  Respiratory: Denies cough, shortness of breath, wheezing GI: Denies severe indigestion/heartburn, abdominal pain, nausea, vomiting, diarrhea and constipation GU: Denies hematuria, incontinence, dysuria  Skin: Denies suspicious skin lesions Neurologic: Denies focal weakness or numbness, change in vision  Past Medical History  Diagnosis Date  . Miscarriage 03/22/2011    Pt reports 2 miscarriages.  . Depression 01/06/2011  . GERD (gastroesophageal reflux disease) 02/17/2011  . Trichotillomania     h/o  . Blood transfusion     "lots"  . Sickle cell anemia with crisis ([place])   . Exertional dyspnea     "sometimes"  . Migraines 11/08/11    "@ least twice/month"  . Chronic back pain     "very severe; have knot in my back; from tight muscle; take RX and exercise for it"  . Mood swings ([place]) 11/08/11    "I go back and forth; real bad"  . Sickle cell anemia [facility])     Past Surgical History  Procedure Laterality Date  . Cholecystectomy  05/2010  . Dilation and curettage of uterus  02/20/11    S/P miscarriage    Allergies  Allergen Reactions  . Carrot [Daucus Carota] Hives, Swelling and Rash    Dietary:  Please do not send any form of carrot to patient  . Carrot Oil Hives and Swelling  . Latex Rash    *powered Latex*     Family History  Problem Relation Age of Onset  . Diabetes Maternal Grandmother   . Diabetes Paternal Grandmother   .  Hypertension Paternal Grandmother   . Diabetes Maternal Grandfather   . Sickle cell trait Mother   . Sickle cell trait Father       Prior to Admission medications   Medication Sig Start Date End Date Taking? Authorizing Provider  aspirin-acetaminophen-caffeine (EXCEDRIN MIGRAINE) [phone] MG tablet Take 1 tablet by mouth daily as needed for headache.   Yes Historical Provider, MD  hydroxyurea (HYDREA) 500 MG capsule Take 500 mg by mouth daily. May take  with food to minimize GI side effects.   Yes Historical Provider, MD  methadone (DOLOPHINE) 5 MG tablet Take 1 tablet (5 mg total) by mouth at bedtime. 03/24/15  Yes Cherl Gorney Essie Christine, MD  oxyCODONE (ROXICODONE) 15 MG immediate release tablet Take 1 tablet (15 mg total) by mouth every 4 (four) hours as needed for pain. 04/07/15  Yes Tresa Garter, MD  folic acid (FOLVITE) 1 MG tablet Take 2 tablets (2 mg total) by mouth daily. Patient not taking: Reported on 04/14/2015 03/26/15   [name], MD     Physical Exam: Filed Vitals:   04/14/15 1105 04/14/15 1540 04/14/15 1624  BP: 107/47  138/79  Pulse: 82  100  Temp: 99.3 F (37.4 C)  99.4 F (37.4 C)  TempSrc:   Oral  Resp: 20 16 16   Height: 5\' 1"  (1.549 m)    Weight: 175 lb (79.379 kg)    SpO2: 100% 94% 94%    General: Alert, awake, afebrile, anicteric, not in obvious distress HEENT: Normocephalic and Atraumatic, Mucous membranes pink                PERRLA; EOM intact; No scleral icterus,                 Nares: Patent, Oropharynx: Clear, Fair Dentition                 Neck: FROM, no cervical lymphadenopathy, thyromegaly, carotid bruit or JVD;  CHEST WALL: No tenderness  CHEST: Normal respiration, clear to auscultation bilaterally  HEART: Regular rate and rhythm; no murmurs rubs or gallops  BACK: No kyphosis or scoliosis; no CVA tenderness  ABDOMEN: Positive Bowel Sounds, soft, non-tender; no masses, no organomegaly Rectal Exam: deferred EXTREMITIES: No cyanosis, clubbing, or edema SKIN:  no rash or ulceration  CNS: Alert and Oriented x 4, Nonfocal exam, CN 2-12 intact  Labs on Admission:  Basic Metabolic Panel: No results for input(s): NA, K, CL, CO2, GLUCOSE, BUN, CREATININE, CALCIUM, MG, PHOS in the last 168 hours. Liver Function Tests: No results for input(s): AST, ALT, ALKPHOS, BILITOT, PROT, ALBUMIN in the last 168 hours. No results for input(s): LIPASE, AMYLASE in the last 168 hours. No results for  input(s): AMMONIA in the last 168 hours. CBC: No results for input(s): WBC, NEUTROABS, HGB, HCT, MCV, PLT in the last 168 hours. Cardiac Enzymes: No results for input(s): CKTOTAL, CKMB, CKMBINDEX, TROPONINI in the last 168 hours.  BNP (last 3 results) No results for input(s): BNP in the last 8760 hours.  ProBNP (last 3 results) No results for input(s): PROBNP in the last 8760 hours.  CBG: No results for input(s): GLUCAP in the last 168 hours.   Assessment/Plan Active Problems:   Sickle cell anemia with pain ([name])   Sickle cell anemia with crisis ([place])   Admits to the In patient  IVF D5 .45% Saline @ 125 mls/hour  IV dilaudid rapid dosing  Weight based Dilaudid PCA   Monitor vitals very closely, Re-evaluate pain scale  every hour  Oxygen by [place]  Labs in am  Code Status: Full  Family Communication: None  DVT Prophylaxis: Lovenox   Time spent: 24 Minutes  Meygan Kyser, MD, MHA, FACP, FAAP, CPE  If 7PM-7AM, please contact night-coverage [email or web address] 04/14/2015, 5:55 PM

## 2015-04-15 ENCOUNTER — Inpatient Hospital Stay (HOSPITAL_COMMUNITY): Payer: Medicare Other

## 2015-04-15 DIAGNOSIS — D5700 Hb-SS disease with crisis, unspecified: Secondary | ICD-10-CM

## 2015-04-15 DIAGNOSIS — D72829 Elevated white blood cell count, unspecified: Secondary | ICD-10-CM

## 2015-04-15 DIAGNOSIS — D638 Anemia in other chronic diseases classified elsewhere: Secondary | ICD-10-CM

## 2015-04-15 LAB — CBC WITH DIFFERENTIAL/PLATELET
BASOS ABS: 0 10*3/uL (ref 0.0–0.1)
Basophils Relative: 0 %
EOS PCT: 2 %
Eosinophils Absolute: 0.4 10*3/uL (ref 0.0–0.7)
HCT: 22.6 % — ABNORMAL LOW (ref 36.0–46.0)
HEMOGLOBIN: 7.8 g/dL — AB (ref 12.0–15.0)
LYMPHS ABS: 3 10*3/uL (ref 0.7–4.0)
LYMPHS PCT: 16 %
MCH: 30.8 pg (ref 26.0–34.0)
MCHC: 34.5 g/dL (ref 30.0–36.0)
MCV: 89.3 fL (ref 78.0–100.0)
Monocytes Absolute: 1.8 10*3/uL — ABNORMAL HIGH (ref 0.1–1.0)
Monocytes Relative: 10 %
NEUTROS PCT: 72 %
Neutro Abs: 14 10*3/uL — ABNORMAL HIGH (ref 1.7–7.7)
PLATELETS: 486 10*3/uL — AB (ref 150–400)
RBC: 2.53 MIL/uL — AB (ref 3.87–5.11)
RDW: 16.5 % — ABNORMAL HIGH (ref 11.5–15.5)
WBC: 19.2 10*3/uL — AB (ref 4.0–10.5)

## 2015-04-15 LAB — BASIC METABOLIC PANEL
ANION GAP: 7 (ref 5–15)
CHLORIDE: 105 mmol/L (ref 101–111)
CO2: 23 mmol/L (ref 22–32)
Calcium: 8.9 mg/dL (ref 8.9–10.3)
Creatinine, Ser: 0.46 mg/dL (ref 0.44–1.00)
Glucose, Bld: 112 mg/dL — ABNORMAL HIGH (ref 65–99)
POTASSIUM: 3.5 mmol/L (ref 3.5–5.1)
SODIUM: 135 mmol/L (ref 135–145)

## 2015-04-15 LAB — TYPE AND SCREEN
ABO/RH(D): B POS
ANTIBODY SCREEN: NEGATIVE

## 2015-04-15 MED ORDER — HYDROMORPHONE HCL 2 MG/ML IJ SOLN
2.0000 mg | INTRAMUSCULAR | Status: DC | PRN
  Administered 2015-04-16 – 2015-04-17 (×8): 2 mg via INTRAVENOUS
  Filled 2015-04-15 (×8): qty 1

## 2015-04-15 MED ORDER — HYDROMORPHONE HCL 2 MG/ML IJ SOLN
2.0000 mg | INTRAMUSCULAR | Status: AC | PRN
Start: 2015-04-15 — End: 2015-04-15
  Administered 2015-04-15 (×3): 2 mg via INTRAVENOUS
  Filled 2015-04-15 (×3): qty 1

## 2015-04-15 MED ORDER — SODIUM CHLORIDE 0.9 % IV SOLN
Freq: Once | INTRAVENOUS | Status: DC
Start: 2015-04-15 — End: 2015-04-22

## 2015-04-15 MED ORDER — HYDROXYUREA 500 MG PO CAPS: 500.0000 mg | ORAL_CAPSULE | Freq: Every day | ORAL | Status: DC

## 2015-04-15 MED ORDER — HYDROMORPHONE HCL 2 MG/ML IJ SOLN
2.0000 mg | INTRAMUSCULAR | Status: AC
  Administered 2015-04-15 – 2015-04-16 (×6): 2 mg via INTRAVENOUS
  Filled 2015-04-15 (×6): qty 1

## 2015-04-15 MED ORDER — LIDOCAINE HCL 1 % IJ SOLN
INTRAMUSCULAR | Status: AC
  Filled 2015-04-15: qty 20

## 2015-04-15 MED ORDER — DIPHENHYDRAMINE HCL 50 MG/ML IJ SOLN
INTRAMUSCULAR | Status: AC
  Administered 2015-04-15: 50 mg
  Filled 2015-04-15: qty 1

## 2015-04-15 MED ORDER — HYDROMORPHONE 1 MG/ML IV SOLN
INTRAVENOUS | Status: DC
  Administered 2015-04-15: 3.42 mg via INTRAVENOUS
  Administered 2015-04-15: 2 mg via INTRAVENOUS
  Administered 2015-04-16: 07:00:00 via INTRAVENOUS
  Administered 2015-04-16: 2 mg via INTRAVENOUS
  Administered 2015-04-16: 5.5 mg via INTRAVENOUS
  Filled 2015-04-15 (×2): qty 25

## 2015-04-15 MED ORDER — SODIUM CHLORIDE 0.9 % IV SOLN: 25.0000 mg | Freq: Four times a day (QID) | INTRAVENOUS | Status: DC | PRN

## 2015-04-15 NOTE — Procedures (Signed)
Successful placement of dual lumen PICC line to right brachial vein. Length 34cm Tip at lower SVC/RA No complications Ready for use.  Tsosie Billing D PA-C 04/15/15 [id number]

## 2015-04-15 NOTE — Progress Notes (Signed)
SICKLE CELL SERVICE PROGRESS NOTE  [name] [id number] DOB: 22-Mar-1991 DOA: 04/14/2015 PCP: Angelica Chessman, MD  Assessment/Plan: Active Problems:   Sickle cell anemia with pain ([name])   Sickle cell anemia with crisis ([place])  1. Hb SS with crisis: Opiate tolerant patient well known to me admitted for sickle cell crisis. Since admission she has used 29.6 mg with 38/36: demands/deliveries. She rates her pain 8/10 and reports that it is localized to her right knee and leg, and low back. She also states that with current bolus dose on PCA she is falling asleep and then cannot use the PCA so that pain escalates. I have adjusted er PCA to a bolus dose of 0.5 mg with a 10 minute lockout and 1 hour limit of 3 mg. Due to her pregnancy, she is unable to receive NSAID's. I will also continue her IVF at current rate until she is drinking well.  2. Leukocytosis: No evidence of infection. I suspect that this is related to her crisis. Will reassess tomorrow after hydration and a 24 hour period of treatment. 3. Anemia of chronic disease: Pt was noted to have a high % of Hb S about 1 month ago. In light of her pregnancy the target Hb is a level of > 8 g/dl and Hb S% <30%. I will T&C for 1 unit in the event that she requires a transfusion vs partial exchange transfusion.  4. Chronic pain: Currently on Methadone 5 mg q HS. Continue. 5. Medication non-compliance  Code Status: Full Code Family Communication: N/A Disposition Plan: Not yet ready for discharge  [place].  Pager [phone]. If 7PM-7AM, please contact night-coverage.  04/15/2015, 12:52 PM  LOS: 1 day   Interim History: Pain still 8/10 and reports that it is localized to her right knee and leg, and low back. She also states that with current bolus dose on PCA she is falling asleep and then cannot use the PCA so that pain escalates. Last BM  yesterday.  Consultants:  None  Procedures:  None  Antibiotics:  None   Objective: Filed Vitals:   04/15/15 0514 04/15/15 0601 04/15/15 0850 04/15/15 0947  BP:  104/60  94/47  Pulse:  88  84  Temp:  98.4 F (36.9 C)  98.3 F (36.8 C)  TempSrc:  Oral  Oral  Resp: 20 20 20 19   Height:      Weight:      SpO2: 97% 98% 99% 98%   Weight change:  No intake or output data in the 24 hours ending 04/15/15 1252  General: Alert, awake, oriented x3, in moderate distress secondary to pain.  HEENT: [place]/AT PEERL, EOMI, anicteric Neck: Trachea midline,  no masses, no thyromegal,y no JVD, no carotid bruit OROPHARYNX:  Moist, No exudate/ erythema/lesions.  Heart: Regular rate and rhythm, without murmurs, rubs, gallops, PMI non-displaced, no heaves or thrills on palpation.  Lungs: Clear to auscultation, no wheezing or rhonchi noted. No increased vocal fremitus resonant to percussion  Abdomen: Soft, nontender, nondistended, positive bowel sounds, no masses no hepatosplenomegaly noted.  Neuro: No focal neurological deficits noted cranial nerves II through XII grossly intact.  Strength at functional baseline in bilateral upper and lower extremities. Musculoskeletal: No warm swelling or erythema around joints, no spinal tenderness noted. Psychiatric: Patient alert and oriented x3, good insight and cognition, good recent to remote recall. Lymph node survey: No cervical axillary or inguinal lymphadenopathy noted.   Data Reviewed: Basic Metabolic Panel:  Recent Labs Lab 04/15/15 0510  NA 135  K 3.5  CL 105  CO2 23  GLUCOSE 112*  BUN <5*  CREATININE 0.46  CALCIUM 8.9   Liver Function Tests: No results for input(s): AST, ALT, ALKPHOS, BILITOT, PROT, ALBUMIN in the last 168 hours. No results for input(s): LIPASE, AMYLASE in the last 168 hours. No results for input(s): AMMONIA in the last 168 hours. CBC:  Recent Labs Lab 04/15/15 0510  WBC 19.2*  NEUTROABS 14.0*  HGB 7.8*  HCT  22.6*  MCV 89.3  PLT 486*   Cardiac Enzymes: No results for input(s): CKTOTAL, CKMB, CKMBINDEX, TROPONINI in the last 168 hours. BNP (last 3 results) No results for input(s): BNP in the last 8760 hours.  ProBNP (last 3 results) No results for input(s): PROBNP in the last 8760 hours.  CBG: No results for input(s): GLUCAP in the last 168 hours.  No results found for this or any previous visit (from the past 240 hour(s)).   Studies: No results found.  Scheduled Meds: . enoxaparin (LOVENOX) injection  40 mg Subcutaneous Q24H  . HYDROmorphone   Intravenous 6 times per day  . senna-docusate  1 tablet Oral BID   Continuous Infusions: . dextrose 5 % and 0.45% NaCl 125 mL/hr at 04/15/15 0902    Time spent 35 minutes

## 2015-04-16 ENCOUNTER — Inpatient Hospital Stay (HOSPITAL_COMMUNITY): Payer: Medicare Other

## 2015-04-16 ENCOUNTER — Telehealth: Payer: Self-pay | Admitting: Internal Medicine

## 2015-04-16 IMAGING — US US OB COMP LESS 14 WK
1 series · 14 of 28 positions shown · non-contrast
Comparison: None.

CLINICAL DATA: Pelvic pain. Ten weeks 1 day by last menstrual
period. Sickle cell.

EXAM:
OBSTETRIC <14 WK US AND TRANSVAGINAL OB US
TECHNIQUE: Both transabdominal and transvaginal ultrasound examinations were
performed for complete evaluation of the gestation as well as the
maternal uterus, adnexal regions, and pelvic cul-de-sac.
Transvaginal technique was performed to assess early pregnancy.

[Series 1: us ob comp less 14 wk · 0.15mm/px · 14 of 54 slices shown]
[im 2/54]
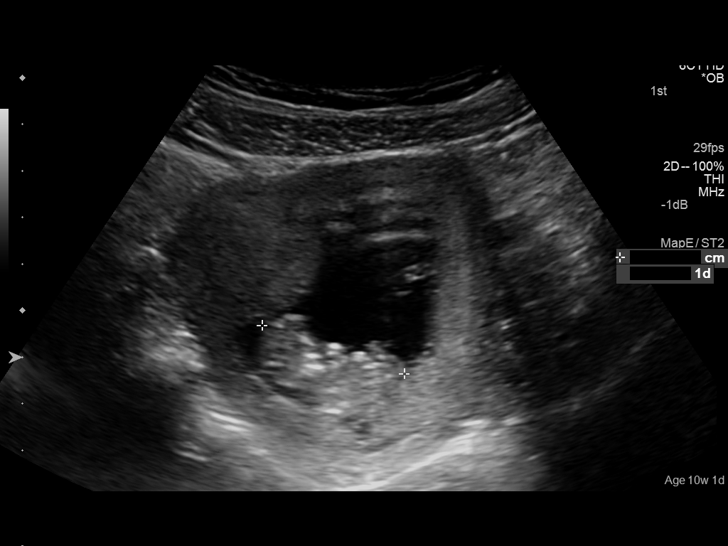
[im 6/54]
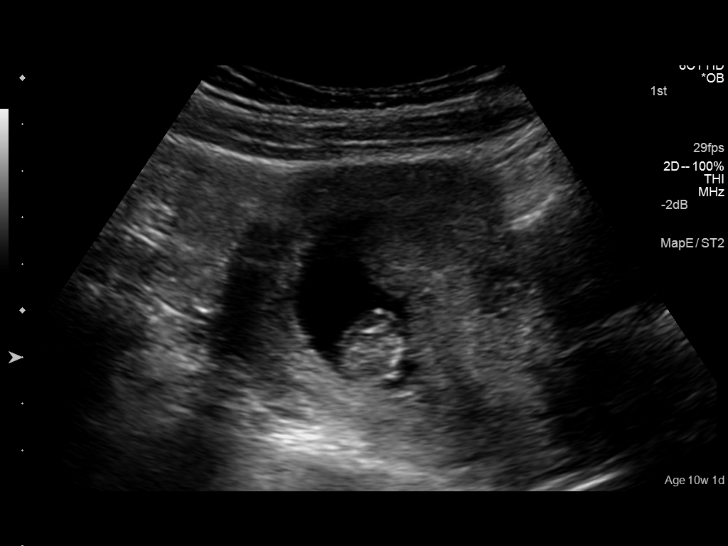
[im 10/54]
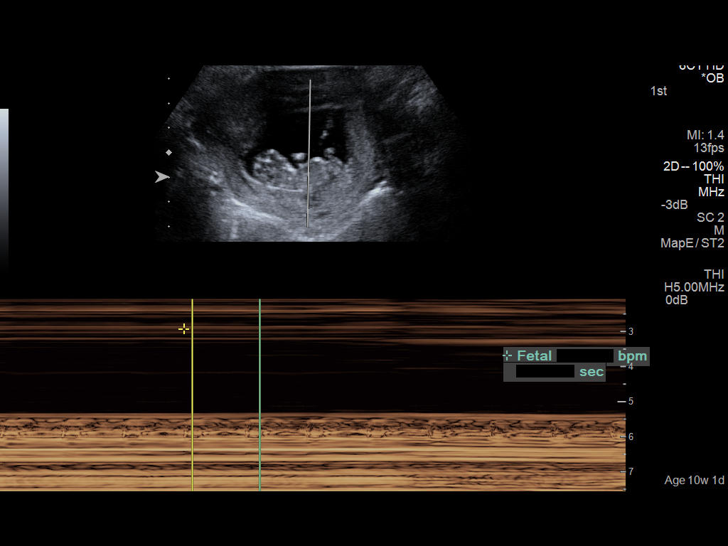
[im 14/54]
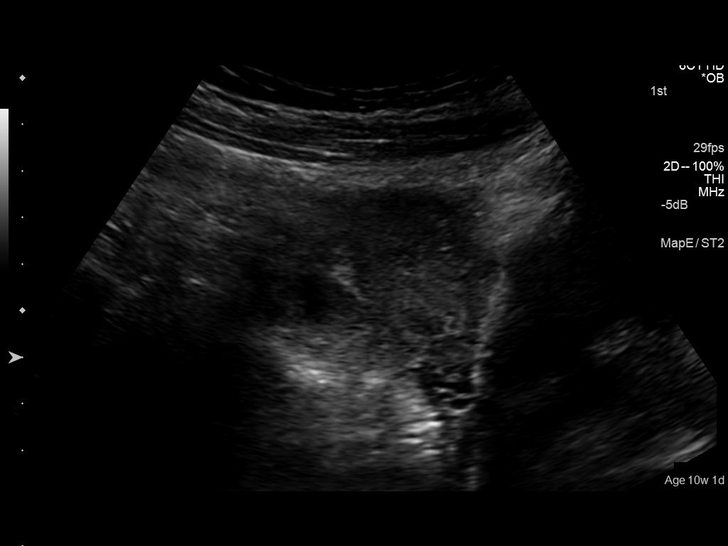
[im 18/54]
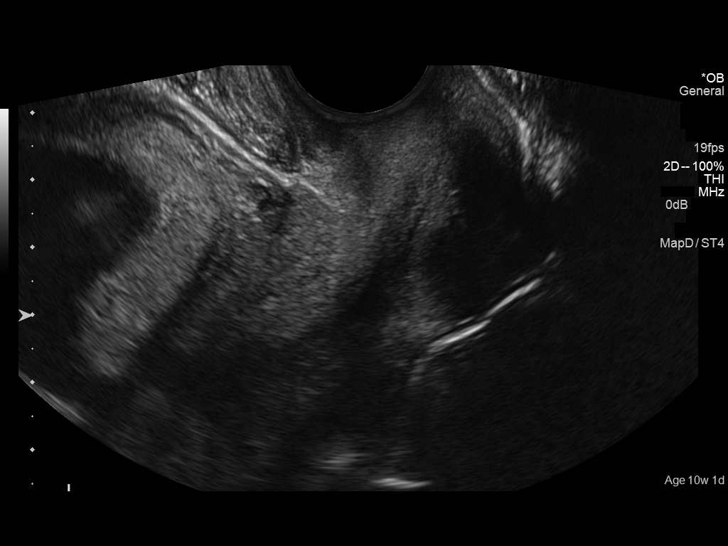
[im 22/54]
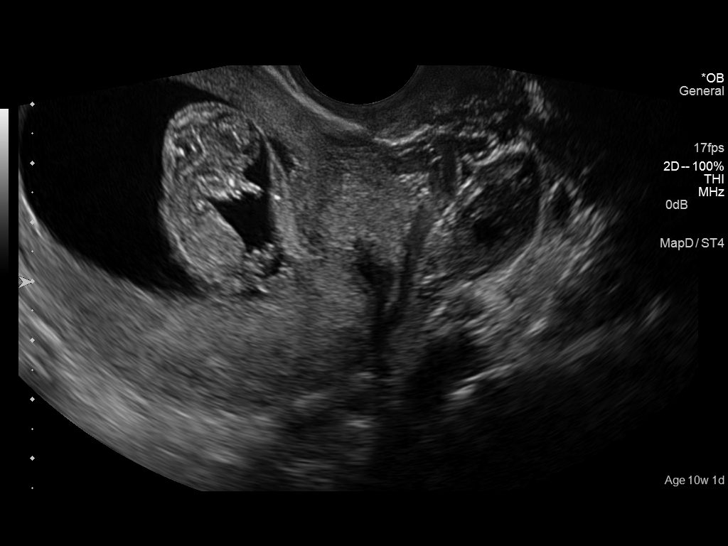
[im 26/54]
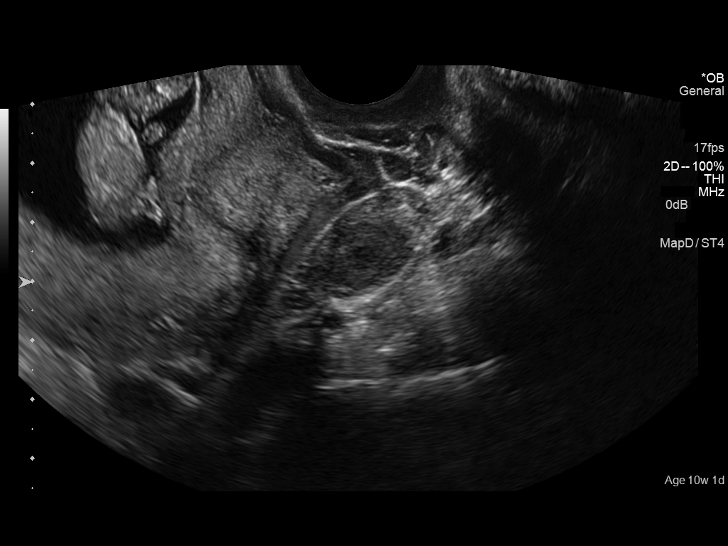
[im 30/54]
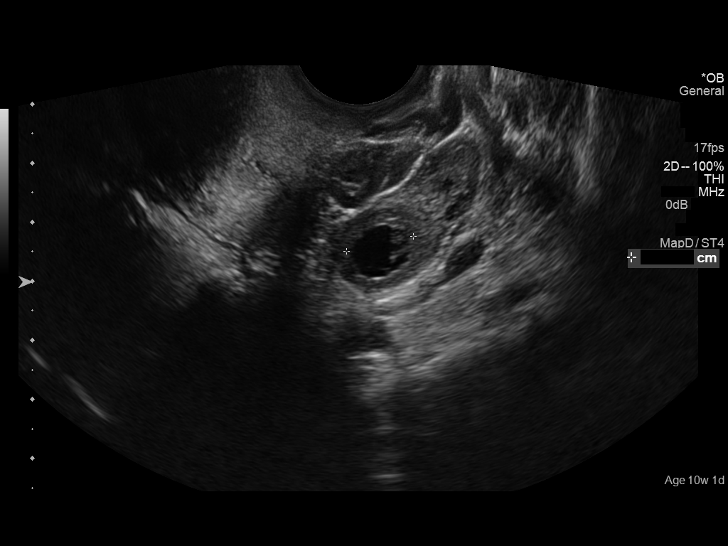
[im 34/54]
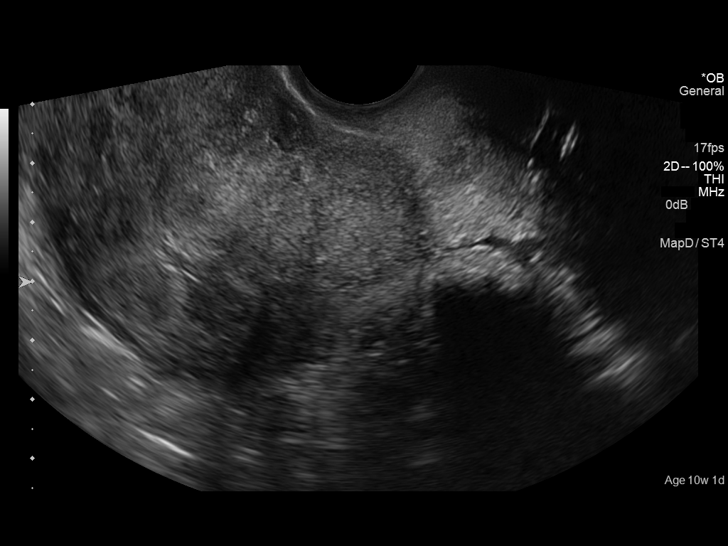
[im 38/54]
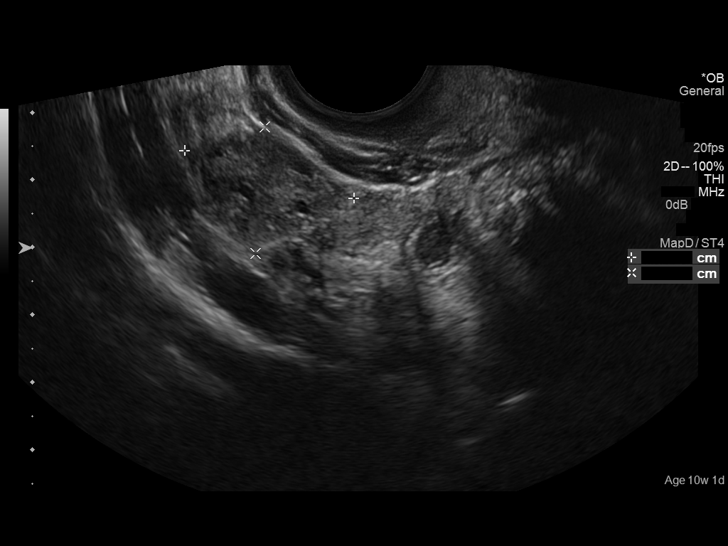
[im 42/54]
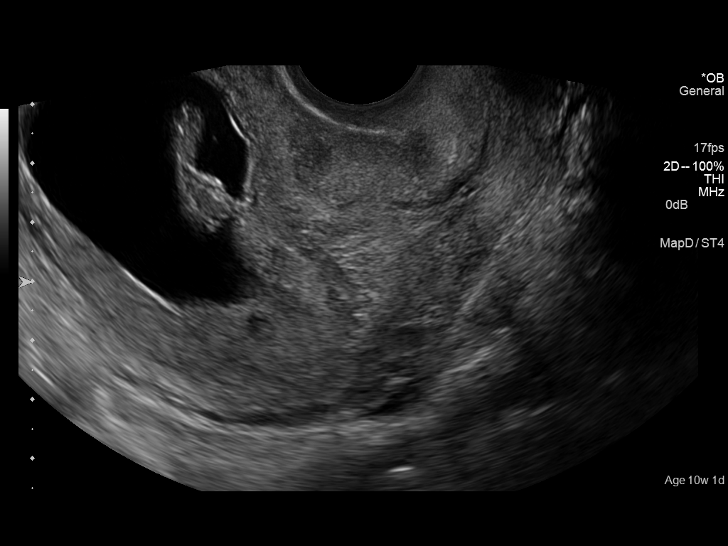
[im 46/54]
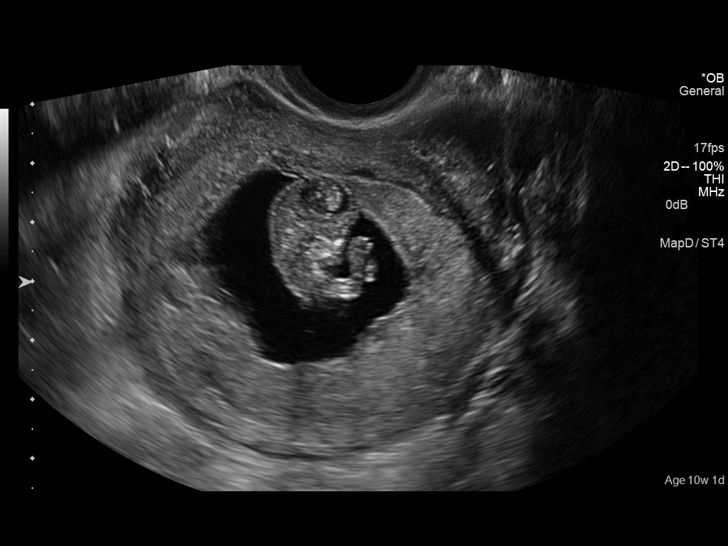
[im 50/54]
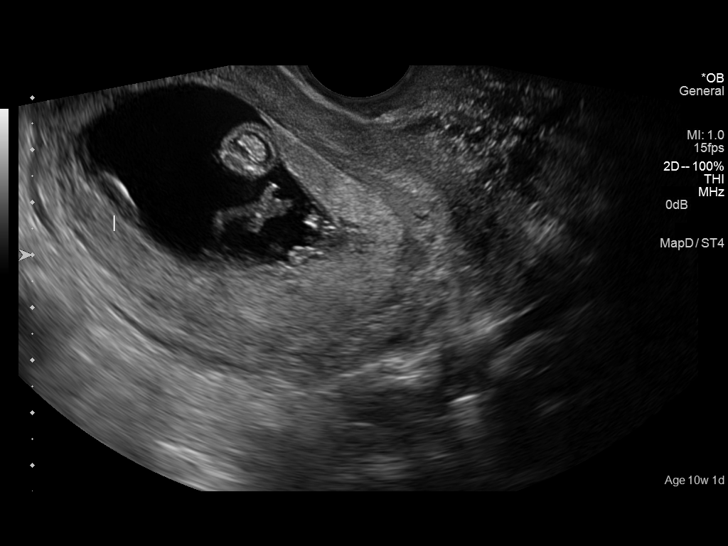
[im 54/54]
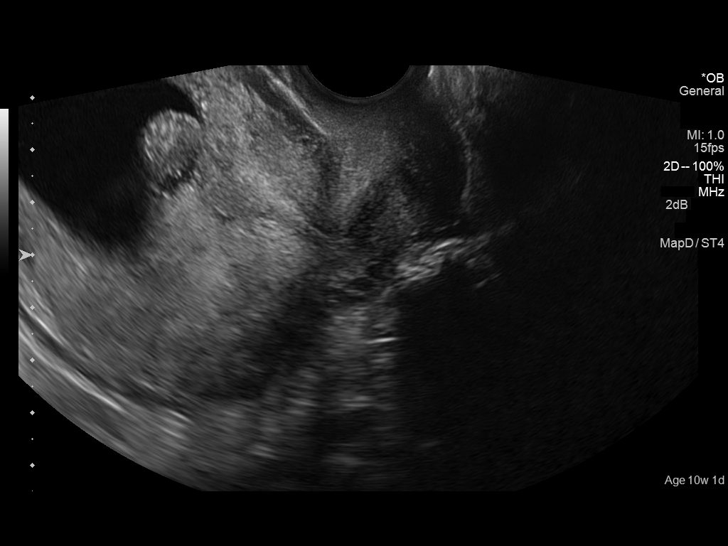

[14 of 28 positions shown; findings below may reference images not displayed]

FINDINGS: Intrauterine gestational sac: Visualized/normal in shape.

Yolk sac:  Visualized

Embryo:  Visualized

Cardiac Activity: Visualized

Heart Rate:  152 bpm

MSD:    mm    w     d

CRL:   34.7  mm   10 w 3 d                  US EDC:

Maternal uterus/adnexae: Small subchorionic hemorrhage. No adnexal
masses. No free fluid.
IMPRESSION: Small subchorionic hemorrhage.  No adnexal masses.  No free fluid.

## 2015-04-16 MED ORDER — DIPHENHYDRAMINE HCL 25 MG PO CAPS
25.0000 mg | ORAL_CAPSULE | ORAL | Status: DC | PRN
  Administered 2015-04-20: 25 mg via ORAL
  Filled 2015-04-16: qty 1

## 2015-04-16 MED ORDER — METHADONE HCL 5 MG PO TABS
5.0000 mg | ORAL_TABLET | Freq: Every day | ORAL | Status: DC
  Administered 2015-04-16 – 2015-04-21 (×6): 5 mg via ORAL
  Filled 2015-04-16 (×6): qty 1

## 2015-04-16 NOTE — Progress Notes (Signed)
At 2044 - Patient c/o 10/10 severe pain. Moaning/grimacing and tearful. Notified on call. Orders received from Tylene Fantasia, per Attending to not adjust pain medications.   Explained to patient per order unable to adjust pain medication at this time and to wait until Attending arrived in the AM. Continue to attempt to explain to patient about her pain medications and offered patient her Methadone schedule QHS. Patient refused at the time saying she may throw it back up. Patient then "throwing a fit", continues to [place], verbalized "I need more pain medicine or I won't make it through the night. I think the PCA order being discontinued was a mistake. You need to call Dr. Zigmund Daniel right now." Again, attempted to explain to patient her orders per Dr. Zigmund Daniel and Tylene Fantasia. Later patient then verbalizes trouble breathing/SOB. O2 sats 95-98% on RA. Lungs CTA. Placed on O2 via [place] for comfort. Patient continues to request pain medications. Paged on call. No new orders received.   At 2120 [facility] into patient room and observed patient on the floor lying flat on her stomach. Patient moaned and groaned to verbal stimuli and was responsive to staff stating "I don't know how I got on the floor" Staff assisted 2-3 back to bed. Patient was able to stand and pivot and reposition self back in bed. Rapid response called 2122.   VSS. Neuro checks WNL. PERRLA. Patient alert and oriented x4. Verbalized "I think I passed out. When asked if she hit her head, patient says she wasn't sure if she hit her head because she had HA upon admission and wasn't sure if the pain was a different feeling at this time. Notified on call, K. Baltazar Najjar at 2150. New orders received for CT head and Neuro checks.    Patient transported via bed to CT and back to floor. Patient resting in bed. Call bell within reach. Yellow grip socks. Side rails. Bed alarm activated. Patient educated to call staff for assistance. Patient acknowledges. Will continue to  monitor.   Dorothea Glassman, RN

## 2015-04-16 NOTE — Plan of Care (Addendum)
RN paged due to pt wanting more pain meds. Strict instructions from rounding attending NOT to increase pain meds at night. Pt "throwing a fit" now per RN and stating she is SOB. Per RN, she is satting well on room air and other VS are stable. Lungs are CTA. Expect this is just attention seeking behavior. Will follow.  [facility], NP [facility] Later, around 2225 hours, Cheryl the RRRN, paged this NP because pt had an unwitnessed fall in the room. Pt says she has a HA but doesn't remember the incident. Will obtain a stat CT head (if OK to do while pregnant) and order neuro checks for 24 hours. This NP explained the pain regimen to RRRN and the reasons for not increasing medications.  KJKG, NP [facility] Addendum: CT head normal.

## 2015-04-16 NOTE — Telephone Encounter (Signed)
Refill request for oxycodone 15mg . LOV 04/07/2015 Please advise. Thanks!

## 2015-04-16 NOTE — Progress Notes (Signed)
SICKLE CELL SERVICE PROGRESS NOTE  [name] ALP:[id number] DOB: Feb 17, 1991 DOA: 04/14/2015 PCP: Angelica Chessman, MD  Assessment/Plan: Active Problems:   Sickle cell anemia with pain ([name])   Sickle cell anemia with crisis ([place])  1. Hb SS with crisis: Pt still rates pain at 7-8/10 and localized to her right knee and leg, and low back. She.reports that she has been sleeping a lot due to the pain. She has used 23.5 mg with 57/47: demands/deliveries.  Continue PCA at bolus dose of 0.5 mg with a 10 minute lockout and 1 hour limit of 3 mg. Also continue PRN clinician assisted doses.Resume Methadone at HS. Due to her pregnancy, she is unable to receive NSAID's. I will also continue her IVF at current rate until she is drinking well.  2. Leukocytosis: No evidence of infection. I suspect that this is related to her crisis. Will reassess tomorrow after hydration and a 24 hour period of treatment. 3. Anemia of chronic disease: Pt was noted to have a high % of Hb S about 1 month ago. In light of her pregnancy the target Hb is a level of > 8 g/dl and Hb S% <30%. I will T&C for 1 unit in the event that she requires a transfusion vs partial exchange transfusion.  4. Chronic pain: Currently on Methadone 5 mg q HS. Continue. 5. Medication non-compliance  Code Status: Full Code Family Communication: N/A Disposition Plan: Not yet ready for discharge  [place].  Pager [phone]. If 7PM-7AM, please contact night-coverage.  04/16/2015, 1:47 PM  LOS: 2 days   Interim History: Pain still 8/10 and reports that it is localized to her right knee and leg. Last BM 2 days ago.  Consultants:  None  Procedures:  None  Antibiotics:  None   Objective: Filed Vitals:   04/16/15 0316 04/16/15 0533 04/16/15 0938 04/16/15 1200  BP:  93/72 106/58   Pulse:  82 91   Temp:  97.5 F (36.4 C) 98.3 F (36.8 C)   TempSrc:  Oral Oral   Resp: _0 Height:      Weight:      SpO2: 98%  95% 98% 96%   Weight change:   Intake/Output Summary (Last 24 hours) at 04/16/15 1347 Last data filed at 04/15/15 1809  Gross per 24 hour  Intake    240 ml  Output    400 ml  Net   -160 ml    General: Alert, awake, oriented x3, in mild distress secondary to pain.  HEENT: [place]/AT PEERL, EOMI, anicteric Neck: Trachea midline,  no masses, no thyromegal,y no JVD, no carotid bruit OROPHARYNX:  Moist, No exudate/ erythema/lesions.  Heart: Regular rate and rhythm, without murmurs, rubs, gallops, PMI non-displaced, no heaves or thrills on palpation.  Lungs: Clear to auscultation, no wheezing or rhonchi noted. No increased vocal fremitus resonant to percussion  Abdomen: Soft, nontender, nondistended, positive bowel sounds, no masses no hepatosplenomegaly noted.  Neuro: No focal neurological deficits noted cranial nerves II through XII grossly intact.  Strength at functional baseline in bilateral upper and lower extremities. Musculoskeletal: No warm swelling or erythema around joints, no spinal tenderness noted. Psychiatric: Patient alert and oriented x3, good insight and cognition, good recent to remote recall.    Data Reviewed: Basic Metabolic Panel:  Recent Labs Lab 04/15/15 0510  NA 135  K 3.5  CL 105  CO2 23  GLUCOSE 112*  BUN <5*  CREATININE 0.46  CALCIUM 8.9   Liver  Function Tests: No results for input(s): AST, ALT, ALKPHOS, BILITOT, PROT, ALBUMIN in the last 168 hours. No results for input(s): LIPASE, AMYLASE in the last 168 hours. No results for input(s): AMMONIA in the last 168 hours. CBC:  Recent Labs Lab 04/15/15 0510  WBC 19.2*  NEUTROABS 14.0*  HGB 7.8*  HCT 22.6*  MCV 89.3  PLT 486*   Cardiac Enzymes: No results for input(s): CKTOTAL, CKMB, CKMBINDEX, TROPONINI in the last 168 hours. BNP (last 3 results) No results for input(s): BNP in the last 8760 hours.  ProBNP (last 3 results) No results for input(s): PROBNP in the last 8760 hours.  CBG: No  results for input(s): GLUCAP in the last 168 hours.  No results found for this or any previous visit (from the past 240 hour(s)).   Studies: Ir Fluoro Guide Cv Line Right  04/16/2015  INDICATION: Poor venous access, request for peripherally inserted central catheter placement. EXAM: ULTRASOUND AND FLUOROSCOPIC GUIDED PICC LINE INSERTION MEDICATIONS: None. CONTRAST:  None FLUOROSCOPY TIME:  1 minute 30 seconds. COMPLICATIONS: None immediate TECHNIQUE: The procedure, risks, benefits, and alternatives were explained to the patient and informed written consent was obtained. A timeout was performed prior to the initiation of the procedure. The right upper extremity was prepped with chlorhexidine in a sterile fashion, and a sterile drape was applied covering the operative field. Maximum barrier sterile technique with sterile gowns and gloves were used for the procedure. A timeout was performed prior to the initiation of the procedure. Local anesthesia was provided with 1% lidocaine. Under direct ultrasound guidance, the right brachial vein was accessed with a micropuncture kit after the overlying soft tissues were anesthetized with 1% lidocaine. An ultrasound image was saved for documentation purposes. A guidewire was advanced to the level of the superior caval-atrial junction for measurement purposes and the PICC line was cut to length. A peel-away sheath was placed and a 34 cm, 5 [place], dual lumen was inserted to level of the superior caval-atrial junction. A post procedure spot fluoroscopic was obtained. The catheter easily aspirated and flushed and was sutured in place. A dressing was placed. The patient tolerated the procedure well without immediate post procedural complication. FINDINGS: After catheter placement, the tip lies within the superior cavoatrial junction. The catheter aspirates and flushes normally and is ready for immediate use. IMPRESSION: Successful ultrasound and fluoroscopic guided placement  of a right brachial vein approach, 34 cm, 5 [place], dual lumen power injectable PICC with tip at the superior caval-atrial junction. The PICC line is ready for immediate use. Read By:  Tsosie Billing PA-C Electronically Signed   By: Aletta Edouard M.D.   On: 04/16/2015 09:41   Ir US Guide Vasc Access Right  04/16/2015  INDICATION: Poor venous access, request for peripherally inserted central catheter placement. EXAM: ULTRASOUND AND FLUOROSCOPIC GUIDED PICC LINE INSERTION MEDICATIONS: None. CONTRAST:  None FLUOROSCOPY TIME:  1 minute 30 seconds. COMPLICATIONS: None immediate TECHNIQUE: The procedure, risks, benefits, and alternatives were explained to the patient and informed written consent was obtained. A timeout was performed prior to the initiation of the procedure. The right upper extremity was prepped with chlorhexidine in a sterile fashion, and a sterile drape was applied covering the operative field. Maximum barrier sterile technique with sterile gowns and gloves were used for the procedure. A timeout was performed prior to the initiation of the procedure. Local anesthesia was provided with 1% lidocaine. Under direct ultrasound guidance, the right brachial vein was accessed with a micropuncture kit  after the overlying soft tissues were anesthetized with 1% lidocaine. An ultrasound image was saved for documentation purposes. A guidewire was advanced to the level of the superior caval-atrial junction for measurement purposes and the PICC line was cut to length. A peel-away sheath was placed and a 34 cm, 5 [place], dual lumen was inserted to level of the superior caval-atrial junction. A post procedure spot fluoroscopic was obtained. The catheter easily aspirated and flushed and was sutured in place. A dressing was placed. The patient tolerated the procedure well without immediate post procedural complication. FINDINGS: After catheter placement, the tip lies within the superior cavoatrial junction. The  catheter aspirates and flushes normally and is ready for immediate use. IMPRESSION: Successful ultrasound and fluoroscopic guided placement of a right brachial vein approach, 34 cm, 5 [place], dual lumen power injectable PICC with tip at the superior caval-atrial junction. The PICC line is ready for immediate use. Read By:  Tsosie Billing PA-C Electronically Signed   By: Aletta Edouard M.D.   On: 04/16/2015 09:41    Scheduled Meds: . sodium chloride   Intravenous Once  . enoxaparin (LOVENOX) injection  40 mg Subcutaneous Q24H  . methadone  5 mg Oral QHS  . senna-docusate  1 tablet Oral BID   Continuous Infusions: . dextrose 5 % and 0.45% NaCl 125 mL/hr at 04/16/15 0055    Time spent 25 minutes

## 2015-04-17 ENCOUNTER — Other Ambulatory Visit: Payer: Self-pay | Admitting: Internal Medicine

## 2015-04-17 DIAGNOSIS — Z331 Pregnant state, incidental: Secondary | ICD-10-CM

## 2015-04-17 LAB — BASIC METABOLIC PANEL
ANION GAP: 8 (ref 5–15)
BUN: 5 mg/dL — ABNORMAL LOW (ref 6–20)
CHLORIDE: 108 mmol/L (ref 101–111)
CO2: 23 mmol/L (ref 22–32)
CREATININE: 0.44 mg/dL (ref 0.44–1.00)
Calcium: 9.3 mg/dL (ref 8.9–10.3)
GFR calc non Af Amer: >60 mL/min (ref >60)
Glucose, Bld: 93 mg/dL (ref 65–99)
POTASSIUM: 3.8 mmol/L (ref 3.5–5.1)
SODIUM: 139 mmol/L (ref 135–145)

## 2015-04-17 LAB — CBC WITH DIFFERENTIAL/PLATELET
BASOS ABS: 0.1 10*3/uL (ref 0.0–0.1)
BASOS PCT: 0 %
EOS ABS: 0.7 10*3/uL (ref 0.0–0.7)
Eosinophils Relative: 4 %
HEMATOCRIT: 21.1 % — AB (ref 36.0–46.0)
HEMOGLOBIN: 7.2 g/dL — AB (ref 12.0–15.0)
Lymphocytes Relative: 15 %
Lymphs Abs: 2.6 10*3/uL (ref 0.7–4.0)
MCH: 30.8 pg (ref 26.0–34.0)
MCHC: 34.1 g/dL (ref 30.0–36.0)
MCV: 90.2 fL (ref 78.0–100.0)
MONOS PCT: 9 %
Monocytes Absolute: 1.6 10*3/uL — ABNORMAL HIGH (ref 0.1–1.0)
NEUTROS ABS: 12.8 10*3/uL — AB (ref 1.7–7.7)
NEUTROS PCT: 72 %
Platelets: 464 10*3/uL — ABNORMAL HIGH (ref 150–400)
RBC: 2.34 MIL/uL — AB (ref 3.87–5.11)
RDW: 17.1 % — ABNORMAL HIGH (ref 11.5–15.5)
WBC: 17.6 10*3/uL — AB (ref 4.0–10.5)

## 2015-04-17 MED ORDER — HYDROMORPHONE HCL 2 MG/ML IJ SOLN
2.0000 mg | INTRAMUSCULAR | Status: AC
Start: 2015-04-17 — End: 2015-04-17
  Administered 2015-04-17 (×6): 2 mg via INTRAVENOUS
  Filled 2015-04-17 (×2): qty 1

## 2015-04-17 MED ORDER — HYDROMORPHONE 1 MG/ML IV SOLN
INTRAVENOUS | Status: DC
  Administered 2015-04-17: 23:00:00 via INTRAVENOUS
  Administered 2015-04-17: 2.59 mg via INTRAVENOUS
  Administered 2015-04-17: 11.2 mg via INTRAVENOUS
  Administered 2015-04-17: 1 mg via INTRAVENOUS
  Administered 2015-04-18: 4 mg via INTRAVENOUS
  Administered 2015-04-18: 12 mg via INTRAVENOUS
  Administered 2015-04-18: 3.5 mg via INTRAVENOUS
  Administered 2015-04-18: 0 mg via INTRAVENOUS
  Administered 2015-04-18: 8 mg via INTRAVENOUS
  Administered 2015-04-18: 8.5 mg via INTRAVENOUS
  Administered 2015-04-19: 2.5 mg via INTRAVENOUS
  Administered 2015-04-19: 3 mg via INTRAVENOUS
  Administered 2015-04-19: 2 mg via INTRAVENOUS
  Administered 2015-04-19: 5.5 mg via INTRAVENOUS
  Administered 2015-04-19: 5 mg via INTRAVENOUS
  Administered 2015-04-19: 6.5 mg via INTRAVENOUS
  Administered 2015-04-20: 5 mg via INTRAVENOUS
  Administered 2015-04-20 (×2): 7.5 mg via INTRAVENOUS
  Administered 2015-04-20: 6.5 mg via INTRAVENOUS
  Administered 2015-04-20: 7 mg via INTRAVENOUS
  Administered 2015-04-20: 08:00:00 via INTRAVENOUS
  Administered 2015-04-21: 6 mg via INTRAVENOUS
  Administered 2015-04-21: 08:00:00 via INTRAVENOUS
  Administered 2015-04-21: 2.5 mg via INTRAVENOUS
  Administered 2015-04-21: 7.5 mg via INTRAVENOUS
  Administered 2015-04-22: 25 mg via INTRAVENOUS
  Administered 2015-04-22: 7 mg via INTRAVENOUS
  Administered 2015-04-22: 7.5 mg via INTRAVENOUS
  Administered 2015-04-22: 6.5 mg via INTRAVENOUS
  Filled 2015-04-17 (×7): qty 25

## 2015-04-17 MED ORDER — NALOXONE HCL 0.4 MG/ML IJ SOLN: 0.4000 mg | INTRAMUSCULAR | Status: DC | PRN

## 2015-04-17 MED ORDER — HYDROMORPHONE HCL 2 MG/ML IJ SOLN
2.0000 mg | INTRAMUSCULAR | Status: AC | PRN
  Administered 2015-04-18 – 2015-04-21 (×23): 2 mg via INTRAVENOUS
  Filled 2015-04-17 (×18): qty 1

## 2015-04-17 MED ORDER — SODIUM CHLORIDE 0.9 % IJ SOLN: 9.0000 mL | INTRAMUSCULAR | Status: DC | PRN

## 2015-04-17 NOTE — Significant Event (Signed)
Rapid Response Event Note  Overview: Time Called: 2122 Arrival Time: 2128 Event Type: Unknown  Initial Focused Assessment:Assessed patient that had been found lying in the floor beside her bed. She was able to get up and get to her bed with assistance. Upon arrival she is resting in bed and c/o right leg pain and headache left frontal. No injuries noted and neuro assessment unremarkable. She was very upset regarding changes in her pain medication orders.    Interventions:Observed and Head CT ordered by NP.   Event Summary: Name of Physician Notified: Forrest Moron NP at 2220    at    Outcome: Stayed in room and stabalized  Event End Time: 2230  Pricilla Riffle

## 2015-04-17 NOTE — Care Management Important Message (Signed)
Important Message  Patient Details  Name: NAKIEA LORENTE MRN: [id number] Date of Birth: 10-Mar-1991   Medicare Important Message Given:  Yes    Camillo Flaming 04/17/2015, 9:15 AMImportant Message  Patient Details  Name: ERANDI HOHENSEE MRN: [id number] Date of Birth: 06-Feb-1991   Medicare Important Message Given:  Yes    Camillo Flaming 04/17/2015, 9:15 AM

## 2015-04-17 NOTE — Plan of Care (Signed)
Problem: Safety: Goal: Ability to remain free from injury will improve Outcome: Not Progressing Pt found in floor on stomach evening of 12/1. Pt states she didn't know how she got there. Now on bed alarm with door open.

## 2015-04-17 NOTE — Progress Notes (Signed)
SICKLE CELL SERVICE PROGRESS NOTE  [name] XFG:[id number] DOB: [birth year] DOA: 04/14/2015 PCP: Angelica Chessman, MD  Assessment/Plan: Active Problems:   Sickle cell anemia with pain ([name])   Sickle cell anemia with crisis ([place])  1. Hb SS with crisis: Pt rates pain at 9/10 and localized to her right knee and leg, and low back. PCA was stopped during the night so will resume  PCA at bolus dose of 0.5 mg with a 10 minute lockout and 1 hour limit of 3 mg. Also continue PRN clinician assisted doses. Continue Methadone at HS. Due to her pregnancy, she is unable to receive NSAID's. Decrease IVF to [facility] as she is drinking well.  2. Leukocytosis: No evidence of infection. I suspect that this is related to her crisis. Will reassess tomorrow after hydration and a 24 hour period of treatment. 3. Anemia of chronic disease: Pt was noted to have a high % of Hb S about 1 month ago. In light of her pregnancy the target Hb is a level of > 8 g/dl and Hb S% <30%. I will T&C for 1 unit in the event that she requires a transfusion vs partial exchange transfusion.  4. Chronic pain: Currently on Methadone 5 mg q HS. Continue. 5. Medication non-compliance  Code Status: Full Code Family Communication: N/A Disposition Plan: Not yet ready for discharge  [place].  Pager [phone]. If 7PM-7AM, please contact night-coverage.  04/17/2015, 3:16 PM  LOS: 3 days   Interim History: Pain still 9/10 and reports that it is localized to her right knee and leg. Last BM last night.  Consultants:  None  Procedures:  None  Antibiotics:  None   Objective: Filed Vitals:   04/17/15 0555 04/17/15 0934 04/17/15 1227 04/17/15 1250  BP: 111/63 123/51  111/66  Pulse: 90 76  98  Temp: 98.5 F (36.9 C) 97.9 F (36.6 C)  100.1 F (37.8 C)  TempSrc: Oral Oral  Oral  Resp: 15 16 12 14   Height:      Weight:      SpO2: 97% 98%  98%   Weight change:   Intake/Output Summary (Last 24 hours) at  04/17/15 1516 Last data filed at 04/17/15 1251  Gross per 24 hour  Intake   5290 ml  Output      0 ml  Net   5290 ml    General: Alert, awake, oriented x3, in mild distress secondary to pain.  HEENT: [place]/AT PEERL, EOMI, anicteric Neck: Trachea midline,  no masses, no thyromegal,y no JVD, no carotid bruit OROPHARYNX:  Moist, No exudate/ erythema/lesions.  Heart: Regular rate and rhythm, without murmurs, rubs, gallops, PMI non-displaced, no heaves or thrills on palpation.  Lungs: Clear to auscultation, no wheezing or rhonchi noted. No increased vocal fremitus resonant to percussion  Abdomen: Soft, nontender, nondistended, positive bowel sounds, no masses no hepatosplenomegaly noted.  Neuro: No focal neurological deficits noted cranial nerves II through XII grossly intact.  Strength at functional baseline in bilateral upper and lower extremities. Musculoskeletal: No warm swelling or erythema around joints, no spinal tenderness noted. Psychiatric: Patient alert and oriented x3, good insight and cognition, good recent to remote recall.    Data Reviewed: Basic Metabolic Panel:  Recent Labs Lab 04/15/15 0510 04/17/15 0355  NA 135 139  K 3.5 3.8  CL 105 108  CO2 23 23  GLUCOSE 112* 93  BUN <5* 5*  CREATININE 0.46 0.44  CALCIUM 8.9 9.3   Liver Function Tests: No results  for input(s): AST, ALT, ALKPHOS, BILITOT, PROT, ALBUMIN in the last 168 hours. No results for input(s): LIPASE, AMYLASE in the last 168 hours. No results for input(s): AMMONIA in the last 168 hours. CBC:  Recent Labs Lab 04/15/15 0510 04/17/15 0355  WBC 19.2* 17.6*  NEUTROABS 14.0* 12.8*  HGB 7.8* 7.2*  HCT 22.6* 21.1*  MCV 89.3 90.2  PLT 486* 464*   Cardiac Enzymes: No results for input(s): CKTOTAL, CKMB, CKMBINDEX, TROPONINI in the last 168 hours. BNP (last 3 results) No results for input(s): BNP in the last 8760 hours.  ProBNP (last 3 results) No results for input(s): PROBNP in the last 8760  hours.  CBG: No results for input(s): GLUCAP in the last 168 hours.  No results found for this or any previous visit (from the past 240 hour(s)).   Studies: Ct Head Wo Contrast  04/16/2015  CLINICAL DATA:  [place] tonight with loss of consciousness. Amnestic for the event. Now with supraorbital pain on the left. EXAM: CT HEAD WITHOUT CONTRAST TECHNIQUE: Contiguous axial images were obtained from the base of the skull through the vertex without intravenous contrast. COMPARISON:  None. FINDINGS: There is no intracranial hemorrhage, mass or evidence of acute infarction. There is no extra-axial fluid collection. Gray matter and white matter appear normal. Cerebral volume is normal for age. Brainstem and posterior fossa are unremarkable. The CSF spaces appear normal. The bony structures are intact. The visible portions of the paranasal sinuses are clear. IMPRESSION: Normal brain Electronically Signed   By: Andreas Newport M.D.   On: 04/16/2015 23:05   Ir Fluoro Guide Cv Line Right  04/16/2015  INDICATION: Poor venous access, request for peripherally inserted central catheter placement. EXAM: ULTRASOUND AND FLUOROSCOPIC GUIDED PICC LINE INSERTION MEDICATIONS: None. CONTRAST:  None FLUOROSCOPY TIME:  1 minute 30 seconds. COMPLICATIONS: None immediate TECHNIQUE: The procedure, risks, benefits, and alternatives were explained to the patient and informed written consent was obtained. A timeout was performed prior to the initiation of the procedure. The right upper extremity was prepped with chlorhexidine in a sterile fashion, and a sterile drape was applied covering the operative field. Maximum barrier sterile technique with sterile gowns and gloves were used for the procedure. A timeout was performed prior to the initiation of the procedure. Local anesthesia was provided with 1% lidocaine. Under direct ultrasound guidance, the right brachial vein was accessed with a micropuncture kit after the overlying soft  tissues were anesthetized with 1% lidocaine. An ultrasound image was saved for documentation purposes. A guidewire was advanced to the level of the superior caval-atrial junction for measurement purposes and the PICC line was cut to length. A peel-away sheath was placed and a 34 cm, 5 [place], dual lumen was inserted to level of the superior caval-atrial junction. A post procedure spot fluoroscopic was obtained. The catheter easily aspirated and flushed and was sutured in place. A dressing was placed. The patient tolerated the procedure well without immediate post procedural complication. FINDINGS: After catheter placement, the tip lies within the superior cavoatrial junction. The catheter aspirates and flushes normally and is ready for immediate use. IMPRESSION: Successful ultrasound and fluoroscopic guided placement of a right brachial vein approach, 34 cm, 5 [place], dual lumen power injectable PICC with tip at the superior caval-atrial junction. The PICC line is ready for immediate use. Read By:  Tsosie Billing PA-C Electronically Signed   By: Aletta Edouard M.D.   On: 04/16/2015 09:41   Ir US Guide Vasc Access Right  04/16/2015  INDICATION: Poor venous access, request for peripherally inserted central catheter placement. EXAM: ULTRASOUND AND FLUOROSCOPIC GUIDED PICC LINE INSERTION MEDICATIONS: None. CONTRAST:  None FLUOROSCOPY TIME:  1 minute 30 seconds. COMPLICATIONS: None immediate TECHNIQUE: The procedure, risks, benefits, and alternatives were explained to the patient and informed written consent was obtained. A timeout was performed prior to the initiation of the procedure. The right upper extremity was prepped with chlorhexidine in a sterile fashion, and a sterile drape was applied covering the operative field. Maximum barrier sterile technique with sterile gowns and gloves were used for the procedure. A timeout was performed prior to the initiation of the procedure. Local anesthesia was provided with 1%  lidocaine. Under direct ultrasound guidance, the right brachial vein was accessed with a micropuncture kit after the overlying soft tissues were anesthetized with 1% lidocaine. An ultrasound image was saved for documentation purposes. A guidewire was advanced to the level of the superior caval-atrial junction for measurement purposes and the PICC line was cut to length. A peel-away sheath was placed and a 34 cm, 5 [place], dual lumen was inserted to level of the superior caval-atrial junction. A post procedure spot fluoroscopic was obtained. The catheter easily aspirated and flushed and was sutured in place. A dressing was placed. The patient tolerated the procedure well without immediate post procedural complication. FINDINGS: After catheter placement, the tip lies within the superior cavoatrial junction. The catheter aspirates and flushes normally and is ready for immediate use. IMPRESSION: Successful ultrasound and fluoroscopic guided placement of a right brachial vein approach, 34 cm, 5 [place], dual lumen power injectable PICC with tip at the superior caval-atrial junction. The PICC line is ready for immediate use. Read By:  Tsosie Billing PA-C Electronically Signed   By: Aletta Edouard M.D.   On: 04/16/2015 09:41    Scheduled Meds: . sodium chloride   Intravenous Once  . enoxaparin (LOVENOX) injection  40 mg Subcutaneous Q24H  . HYDROmorphone   Intravenous 6 times per day  .  HYDROmorphone (DILAUDID) injection  2 mg Intravenous Q2H  . methadone  5 mg Oral QHS  . senna-docusate  1 tablet Oral BID   Continuous Infusions: . dextrose 5 % and 0.45% NaCl 10 mL/hr at 04/16/15 1655    Time spent 25 minutes

## 2015-04-18 MED ORDER — VITAMINS A & D EX OINT
TOPICAL_OINTMENT | CUTANEOUS | Status: AC
  Administered 2015-04-18: 18:00:00
  Filled 2015-04-18: qty 5

## 2015-04-18 NOTE — Progress Notes (Signed)
SICKLE CELL SERVICE PROGRESS NOTE  [name] UEA:[id number] DOB: 08/18/1990 DOA: 04/14/2015 PCP: Angelica Chessman, MD  Assessment/Plan: Active Problems:   Sickle cell anemia with pain ([name])   Sickle cell anemia with crisis ([place])  1. Hb SS with crisis: Pt rates pain at 8/10 and localized to her right knee and leg, and low back. On Dilaudid PCA and has used 36.5 mg with 60 demands and 53 deliveries in the last 24 hour's. Continue current PCA dose and Continue Methadone at HS. Due to her pregnancy, she is unable to receive NSAID's. Decrease IVF to [facility] as she is drinking well.  2. Leukocytosis: No evidence of infection. I suspect that this is related to her crisis. Will reassess tomorrow after hydration and a 24 hour period of treatment. 3. Anemia of chronic disease: Pt was noted to have a high % of Hb S about 1 month ago. In light of her pregnancy the target Hb is a level of > 8 g/dl and Hb S% <30%. I will T&C for 1 unit in the event that she requires a transfusion vs partial exchange transfusion.  4. Chronic pain: Currently on Methadone 5 mg q HS. Continue. 5. Medication non-compliance  Code Status: Full Code Family Communication: N/A Disposition Plan: Not yet ready for discharge  [facility]  Pager [phone]. If 7PM-7AM, please contact night-coverage.  04/18/2015, 8:21 AM  LOS: 4 days   Interim History: Pain still 9/10 and reports that it is localized to her right knee and leg. Last BM last night.  Consultants:  None  Procedures:  None  Antibiotics:  None   Objective: Filed Vitals:   04/18/15 0229 04/18/15 0537 04/18/15 0642 04/18/15 0800  BP: 102/44  99/46   Pulse: 96  99   Temp: 98.4 F (36.9 C)  98 F (36.7 C)   TempSrc: Oral  Oral   Resp: 16 15 9 12   Height:      Weight:      SpO2: 100% 96% 97% 99%   Weight change:   Intake/Output Summary (Last 24 hours) at 04/18/15 2130 Last data filed at 04/17/15 1800  Gross per 24 hour  Intake   7540 ml   Output      0 ml  Net   7540 ml    General: Alert, awake, oriented x3, in mild distress secondary to pain.  HEENT: [place]/AT PEERL, EOMI, anicteric Neck: Trachea midline,  no masses, no thyromegal,y no JVD, no carotid bruit OROPHARYNX:  Moist, No exudate/ erythema/lesions.  Heart: Regular rate and rhythm, without murmurs, rubs, gallops, PMI non-displaced, no heaves or thrills on palpation.  Lungs: Clear to auscultation, no wheezing or rhonchi noted. No increased vocal fremitus resonant to percussion  Abdomen: Soft, nontender, nondistended, positive bowel sounds, no masses no hepatosplenomegaly noted.  Neuro: No focal neurological deficits noted cranial nerves II through XII grossly intact.  Strength at functional baseline in bilateral upper and lower extremities. Musculoskeletal: No warm swelling or erythema around joints, no spinal tenderness noted. Psychiatric: Patient alert and oriented x3, good insight and cognition, good recent to remote recall.    Data Reviewed: Basic Metabolic Panel:  Recent Labs Lab 04/15/15 0510 04/17/15 0355  NA 135 139  K 3.5 3.8  CL 105 108  CO2 23 23  GLUCOSE 112* 93  BUN <5* 5*  CREATININE 0.46 0.44  CALCIUM 8.9 9.3   Liver Function Tests: No results for input(s): AST, ALT, ALKPHOS, BILITOT, PROT, ALBUMIN in the last 168 hours. No results  for input(s): LIPASE, AMYLASE in the last 168 hours. No results for input(s): AMMONIA in the last 168 hours. CBC:  Recent Labs Lab 04/15/15 0510 04/17/15 0355  WBC 19.2* 17.6*  NEUTROABS 14.0* 12.8*  HGB 7.8* 7.2*  HCT 22.6* 21.1*  MCV 89.3 90.2  PLT 486* 464*   Cardiac Enzymes: No results for input(s): CKTOTAL, CKMB, CKMBINDEX, TROPONINI in the last 168 hours. BNP (last 3 results) No results for input(s): BNP in the last 8760 hours.  ProBNP (last 3 results) No results for input(s): PROBNP in the last 8760 hours.  CBG: No results for input(s): GLUCAP in the last 168 hours.  No results found  for this or any previous visit (from the past 240 hour(s)).   Studies: Ct Head Wo Contrast  04/16/2015  CLINICAL DATA:  [place] tonight with loss of consciousness. Amnestic for the event. Now with supraorbital pain on the left. EXAM: CT HEAD WITHOUT CONTRAST TECHNIQUE: Contiguous axial images were obtained from the base of the skull through the vertex without intravenous contrast. COMPARISON:  None. FINDINGS: There is no intracranial hemorrhage, mass or evidence of acute infarction. There is no extra-axial fluid collection. Gray matter and white matter appear normal. Cerebral volume is normal for age. Brainstem and posterior fossa are unremarkable. The CSF spaces appear normal. The bony structures are intact. The visible portions of the paranasal sinuses are clear. IMPRESSION: Normal brain Electronically Signed   By: Andreas Newport M.D.   On: 04/16/2015 23:05   Ir Fluoro Guide Cv Line Right  04/16/2015  INDICATION: Poor venous access, request for peripherally inserted central catheter placement. EXAM: ULTRASOUND AND FLUOROSCOPIC GUIDED PICC LINE INSERTION MEDICATIONS: None. CONTRAST:  None FLUOROSCOPY TIME:  1 minute 30 seconds. COMPLICATIONS: None immediate TECHNIQUE: The procedure, risks, benefits, and alternatives were explained to the patient and informed written consent was obtained. A timeout was performed prior to the initiation of the procedure. The right upper extremity was prepped with chlorhexidine in a sterile fashion, and a sterile drape was applied covering the operative field. Maximum barrier sterile technique with sterile gowns and gloves were used for the procedure. A timeout was performed prior to the initiation of the procedure. Local anesthesia was provided with 1% lidocaine. Under direct ultrasound guidance, the right brachial vein was accessed with a micropuncture kit after the overlying soft tissues were anesthetized with 1% lidocaine. An ultrasound image was saved for documentation  purposes. A guidewire was advanced to the level of the superior caval-atrial junction for measurement purposes and the PICC line was cut to length. A peel-away sheath was placed and a 34 cm, 5 [place], dual lumen was inserted to level of the superior caval-atrial junction. A post procedure spot fluoroscopic was obtained. The catheter easily aspirated and flushed and was sutured in place. A dressing was placed. The patient tolerated the procedure well without immediate post procedural complication. FINDINGS: After catheter placement, the tip lies within the superior cavoatrial junction. The catheter aspirates and flushes normally and is ready for immediate use. IMPRESSION: Successful ultrasound and fluoroscopic guided placement of a right brachial vein approach, 34 cm, 5 [place], dual lumen power injectable PICC with tip at the superior caval-atrial junction. The PICC line is ready for immediate use. Read By:  Tsosie Billing PA-C Electronically Signed   By: Aletta Edouard M.D.   On: 04/16/2015 09:41   Ir US Guide Vasc Access Right  04/16/2015  INDICATION: Poor venous access, request for peripherally inserted central catheter placement. EXAM: ULTRASOUND AND  FLUOROSCOPIC GUIDED PICC LINE INSERTION MEDICATIONS: None. CONTRAST:  None FLUOROSCOPY TIME:  1 minute 30 seconds. COMPLICATIONS: None immediate TECHNIQUE: The procedure, risks, benefits, and alternatives were explained to the patient and informed written consent was obtained. A timeout was performed prior to the initiation of the procedure. The right upper extremity was prepped with chlorhexidine in a sterile fashion, and a sterile drape was applied covering the operative field. Maximum barrier sterile technique with sterile gowns and gloves were used for the procedure. A timeout was performed prior to the initiation of the procedure. Local anesthesia was provided with 1% lidocaine. Under direct ultrasound guidance, the right brachial vein was accessed with a  micropuncture kit after the overlying soft tissues were anesthetized with 1% lidocaine. An ultrasound image was saved for documentation purposes. A guidewire was advanced to the level of the superior caval-atrial junction for measurement purposes and the PICC line was cut to length. A peel-away sheath was placed and a 34 cm, 5 [place], dual lumen was inserted to level of the superior caval-atrial junction. A post procedure spot fluoroscopic was obtained. The catheter easily aspirated and flushed and was sutured in place. A dressing was placed. The patient tolerated the procedure well without immediate post procedural complication. FINDINGS: After catheter placement, the tip lies within the superior cavoatrial junction. The catheter aspirates and flushes normally and is ready for immediate use. IMPRESSION: Successful ultrasound and fluoroscopic guided placement of a right brachial vein approach, 34 cm, 5 [place], dual lumen power injectable PICC with tip at the superior caval-atrial junction. The PICC line is ready for immediate use. Read By:  Tsosie Billing PA-C Electronically Signed   By: Aletta Edouard M.D.   On: 04/16/2015 09:41    Scheduled Meds: . sodium chloride   Intravenous Once  . enoxaparin (LOVENOX) injection  40 mg Subcutaneous Q24H  . HYDROmorphone   Intravenous 6 times per day  . methadone  5 mg Oral QHS  . senna-docusate  1 tablet Oral BID   Continuous Infusions: . dextrose 5 % and 0.45% NaCl 10 mL/hr at 04/16/15 1655    Time spent 25 minutes

## 2015-04-19 LAB — COMPREHENSIVE METABOLIC PANEL
ALT: 13 U/L — ABNORMAL LOW (ref 14–54)
ANION GAP: 6 (ref 5–15)
AST: 15 U/L (ref 15–41)
Albumin: 3.6 g/dL (ref 3.5–5.0)
Alkaline Phosphatase: 38 U/L (ref 38–126)
BILIRUBIN TOTAL: 2.3 mg/dL — AB (ref 0.3–1.2)
BUN: 6 mg/dL (ref 6–20)
CHLORIDE: 104 mmol/L (ref 101–111)
CO2: 24 mmol/L (ref 22–32)
CREATININE: 0.4 mg/dL — AB (ref 0.44–1.00)
Calcium: 8.8 mg/dL — ABNORMAL LOW (ref 8.9–10.3)
Glucose, Bld: 119 mg/dL — ABNORMAL HIGH (ref 65–99)
POTASSIUM: 3.8 mmol/L (ref 3.5–5.1)
Sodium: 134 mmol/L — ABNORMAL LOW (ref 135–145)
Total Protein: 6.5 g/dL (ref 6.5–8.1)

## 2015-04-19 LAB — CBC WITH DIFFERENTIAL/PLATELET
Basophils Absolute: 0.1 10*3/uL (ref 0.0–0.1)
Basophils Relative: 0 %
EOS PCT: 5 %
Eosinophils Absolute: 0.8 10*3/uL — ABNORMAL HIGH (ref 0.0–0.7)
HEMATOCRIT: 19.4 % — AB (ref 36.0–46.0)
Hemoglobin: 6.8 g/dL — CL (ref 12.0–15.0)
LYMPHS PCT: 12 %
Lymphs Abs: 2 10*3/uL (ref 0.7–4.0)
MCH: 31.5 pg (ref 26.0–34.0)
MCHC: 35.1 g/dL (ref 30.0–36.0)
MCV: 89.8 fL (ref 78.0–100.0)
MONO ABS: 1.6 10*3/uL — AB (ref 0.1–1.0)
MONOS PCT: 10 %
NEUTROS ABS: 12.3 10*3/uL — AB (ref 1.7–7.7)
Neutrophils Relative %: 73 %
Platelets: 414 10*3/uL — ABNORMAL HIGH (ref 150–400)
RBC: 2.16 MIL/uL — ABNORMAL LOW (ref 3.87–5.11)
RDW: 16.2 % — AB (ref 11.5–15.5)
WBC: 16.7 10*3/uL — ABNORMAL HIGH (ref 4.0–10.5)

## 2015-04-19 NOTE — Progress Notes (Signed)
SICKLE CELL SERVICE PROGRESS NOTE  [name] IHK:[id number] DOB: March 09, 1991 DOA: 04/14/2015 PCP: Angelica Chessman, MD  Assessment/Plan: Active Problems:   Sickle cell anemia with pain ([name])   Sickle cell anemia with crisis ([place])  1. Hb SS with crisis: Pt rates pain still at 8/10 and localized to her low back. On Dilaudid PCA and has used 31.5 mg with 56 demands and 54 deliveries in the last 24 hours. Continue current PCA dose and Continue Methadone at HS. Due to her pregnancy, she is unable to receive NSAID's.  2. Leukocytosis: No evidence of infection. Chronically elevated. White count is stable right now. 3. Anemia of chronic disease: Pt was noted to have a high % of Hb S about 1 month ago. In light of her pregnancy the target Hb is a level of > 8 g/dl and Hb S% <30%. I will T&C for 1 unit in the event that she requires a transfusion vs partial exchange transfusion.  4. Chronic pain: Currently on Methadone 5 mg q HS. Continue. 5. Medication non-compliance  Code Status: Full Code Family Communication: N/A Disposition Plan: Not yet ready for discharge  [facility]  Pager [phone]. If 7PM-7AM, please contact night-coverage.  04/19/2015, 10:30 AM  LOS: 5 days   Interim History: Pain still 9/10 and reports that it is localized to her right knee and leg. Last BM last night.  Consultants:  None  Procedures:  None  Antibiotics:  None   Objective: Filed Vitals:   04/19/15 0124 04/19/15 0400 04/19/15 0508 04/19/15 0736  BP: 106/66  104/52   Pulse: 91  91   Temp: 98 F (36.7 C)  97.9 F (36.6 C)   TempSrc: Oral  Oral   Resp: _0 Height:      Weight:   80.105 kg (176 lb 9.6 oz)   SpO2: 100% 100% 97% 98%   Weight change:   Intake/Output Summary (Last 24 hours) at 04/19/15 1030 Last data filed at 04/19/15 0509  Gross per 24 hour  Intake   1282 ml  Output      0 ml  Net   1282 ml    General: Alert, awake, oriented x3, in mild distress secondary to  pain.  HEENT: [place]/AT PEERL, EOMI, anicteric Neck: Trachea midline,  no masses, no thyromegal,y no JVD, no carotid bruit OROPHARYNX:  Moist, No exudate/ erythema/lesions.  Heart: Regular rate and rhythm, without murmurs, rubs, gallops, PMI non-displaced, no heaves or thrills on palpation.  Lungs: Clear to auscultation, no wheezing or rhonchi noted. No increased vocal fremitus resonant to percussion  Abdomen: Soft, nontender, nondistended, positive bowel sounds, no masses no hepatosplenomegaly noted.  Neuro: No focal neurological deficits noted cranial nerves II through XII grossly intact.  Strength at functional baseline in bilateral upper and lower extremities. Musculoskeletal: No warm swelling or erythema around joints, no spinal tenderness noted. Psychiatric: Patient alert and oriented x3, good insight and cognition, good recent to remote recall.    Data Reviewed: Basic Metabolic Panel:  Recent Labs Lab 04/15/15 0510 04/17/15 0355 04/19/15 0520  NA 135 139 134*  K 3.5 3.8 3.8  CL 105 108 104  CO2 _1 GLUCOSE 112* 93 119*  BUN <5* 5* 6  CREATININE 0.46 0.44 0.40*  CALCIUM 8.9 9.3 8.8*   Liver Function Tests:  Recent Labs Lab 04/19/15 0520  AST 15  ALT 13*  ALKPHOS 38  BILITOT 2.3*  PROT 6.5  ALBUMIN 3.6   No results  for input(s): LIPASE, AMYLASE in the last 168 hours. No results for input(s): AMMONIA in the last 168 hours. CBC:  Recent Labs Lab 04/15/15 0510 04/17/15 0355 04/19/15 0520  WBC 19.2* 17.6* 16.7*  NEUTROABS 14.0* 12.8* 12.3*  HGB 7.8* 7.2* 6.8*  HCT 22.6* 21.1* 19.4*  MCV 89.3 90.2 89.8  PLT 486* 464* 414*   Cardiac Enzymes: No results for input(s): CKTOTAL, CKMB, CKMBINDEX, TROPONINI in the last 168 hours. BNP (last 3 results) No results for input(s): BNP in the last 8760 hours.  ProBNP (last 3 results) No results for input(s): PROBNP in the last 8760 hours.  CBG: No results for input(s): GLUCAP in the last 168 hours.  No results  found for this or any previous visit (from the past 240 hour(s)).   Studies: Ct Head Wo Contrast  04/16/2015  CLINICAL DATA:  [place] tonight with loss of consciousness. Amnestic for the event. Now with supraorbital pain on the left. EXAM: CT HEAD WITHOUT CONTRAST TECHNIQUE: Contiguous axial images were obtained from the base of the skull through the vertex without intravenous contrast. COMPARISON:  None. FINDINGS: There is no intracranial hemorrhage, mass or evidence of acute infarction. There is no extra-axial fluid collection. Gray matter and white matter appear normal. Cerebral volume is normal for age. Brainstem and posterior fossa are unremarkable. The CSF spaces appear normal. The bony structures are intact. The visible portions of the paranasal sinuses are clear. IMPRESSION: Normal brain Electronically Signed   By: Andreas Newport M.D.   On: 04/16/2015 23:05   Ir Fluoro Guide Cv Line Right  04/16/2015  INDICATION: Poor venous access, request for peripherally inserted central catheter placement. EXAM: ULTRASOUND AND FLUOROSCOPIC GUIDED PICC LINE INSERTION MEDICATIONS: None. CONTRAST:  None FLUOROSCOPY TIME:  1 minute 30 seconds. COMPLICATIONS: None immediate TECHNIQUE: The procedure, risks, benefits, and alternatives were explained to the patient and informed written consent was obtained. A timeout was performed prior to the initiation of the procedure. The right upper extremity was prepped with chlorhexidine in a sterile fashion, and a sterile drape was applied covering the operative field. Maximum barrier sterile technique with sterile gowns and gloves were used for the procedure. A timeout was performed prior to the initiation of the procedure. Local anesthesia was provided with 1% lidocaine. Under direct ultrasound guidance, the right brachial vein was accessed with a micropuncture kit after the overlying soft tissues were anesthetized with 1% lidocaine. An ultrasound image was saved for  documentation purposes. A guidewire was advanced to the level of the superior caval-atrial junction for measurement purposes and the PICC line was cut to length. A peel-away sheath was placed and a 34 cm, 5 [place], dual lumen was inserted to level of the superior caval-atrial junction. A post procedure spot fluoroscopic was obtained. The catheter easily aspirated and flushed and was sutured in place. A dressing was placed. The patient tolerated the procedure well without immediate post procedural complication. FINDINGS: After catheter placement, the tip lies within the superior cavoatrial junction. The catheter aspirates and flushes normally and is ready for immediate use. IMPRESSION: Successful ultrasound and fluoroscopic guided placement of a right brachial vein approach, 34 cm, 5 [place], dual lumen power injectable PICC with tip at the superior caval-atrial junction. The PICC line is ready for immediate use. Read By:  Tsosie Billing PA-C Electronically Signed   By: Aletta Edouard M.D.   On: 04/16/2015 09:41   Ir US Guide Vasc Access Right  04/16/2015  INDICATION: Poor venous access, request for  peripherally inserted central catheter placement. EXAM: ULTRASOUND AND FLUOROSCOPIC GUIDED PICC LINE INSERTION MEDICATIONS: None. CONTRAST:  None FLUOROSCOPY TIME:  1 minute 30 seconds. COMPLICATIONS: None immediate TECHNIQUE: The procedure, risks, benefits, and alternatives were explained to the patient and informed written consent was obtained. A timeout was performed prior to the initiation of the procedure. The right upper extremity was prepped with chlorhexidine in a sterile fashion, and a sterile drape was applied covering the operative field. Maximum barrier sterile technique with sterile gowns and gloves were used for the procedure. A timeout was performed prior to the initiation of the procedure. Local anesthesia was provided with 1% lidocaine. Under direct ultrasound guidance, the right brachial vein was  accessed with a micropuncture kit after the overlying soft tissues were anesthetized with 1% lidocaine. An ultrasound image was saved for documentation purposes. A guidewire was advanced to the level of the superior caval-atrial junction for measurement purposes and the PICC line was cut to length. A peel-away sheath was placed and a 34 cm, 5 [place], dual lumen was inserted to level of the superior caval-atrial junction. A post procedure spot fluoroscopic was obtained. The catheter easily aspirated and flushed and was sutured in place. A dressing was placed. The patient tolerated the procedure well without immediate post procedural complication. FINDINGS: After catheter placement, the tip lies within the superior cavoatrial junction. The catheter aspirates and flushes normally and is ready for immediate use. IMPRESSION: Successful ultrasound and fluoroscopic guided placement of a right brachial vein approach, 34 cm, 5 [place], dual lumen power injectable PICC with tip at the superior caval-atrial junction. The PICC line is ready for immediate use. Read By:  Tsosie Billing PA-C Electronically Signed   By: Aletta Edouard M.D.   On: 04/16/2015 09:41    Scheduled Meds: . sodium chloride   Intravenous Once  . enoxaparin (LOVENOX) injection  40 mg Subcutaneous Q24H  . HYDROmorphone   Intravenous 6 times per day  . methadone  5 mg Oral QHS  . senna-docusate  1 tablet Oral BID   Continuous Infusions: . dextrose 5 % and 0.45% NaCl 10 mL/hr at 04/18/15 2054    Time spent 25 minutes

## 2015-04-19 NOTE — Progress Notes (Addendum)
Paged MD at 0600 re critical Hg 6.8 this am. Corky Sox  MD called back - no new orders. She will notify day MD when day shift gets here. Corky Sox

## 2015-04-20 LAB — PREPARE RBC (CROSSMATCH)

## 2015-04-20 MED ORDER — SODIUM CHLORIDE 0.9 % IV SOLN: Freq: Once | INTRAVENOUS | Status: DC

## 2015-04-20 NOTE — Progress Notes (Signed)
SICKLE CELL SERVICE PROGRESS NOTE  [name] WLS:[id number] DOB: August 05, 1990 DOA: 04/14/2015 PCP: Angelica Chessman, MD  Assessment/Plan: Active Problems:   Sickle cell anemia with pain ([name])   Sickle cell anemia with crisis ([place])  1. Hb SS with crisis: Pt continues to have pain. Will continue PCA and start planning to transition to oral medications. 2. Early Pregnancy: Current Hb is 6.8. In light of the pregnancy will transfuse 1 unit RBC to get to a target of 8 g/dl. This is likely impacting her pain as the fetus is parasitic and will cause depletion of her Oxygen to tissue at lower concentrations of Hb. 3. Leukocytosis: No evidence of infection. I suspect that this is related to her crisis.  4. Anemia of chronic disease: P Pt was noted to have a high % of Hb S about 1 month ago. In light of her pregnancy the target Hb is a level of > 8 g/dl and Hb S% <30%. I will T&C for 1 unit in the event that she requires a transfusion vs partial exchange transfusion.  5. Chronic pain: Currently on Methadone 5 mg q HS. Continue.   Code Status: Full Code Family Communication: N/A Disposition Plan: Not yet ready for discharge  [place].  Pager [phone]. If 7PM-7AM, please contact night-coverage.  04/20/2015, 3:27 PM  LOS: 6 days   Interim History: Pain still 9/10 and reports that it is localized to her right knee and leg. Last BM last night.  Consultants:  None  Procedures:  None  Antibiotics:  None   Objective: Filed Vitals:   04/20/15 0748 04/20/15 1031 04/20/15 1200 04/20/15 1402  BP:  106/51  101/54  Pulse:  85  88  Temp:  98.5 F (36.9 C)  98.4 F (36.9 C)  TempSrc:  Oral  Oral  Resp: _0 Height:      Weight:      SpO2: 99% 99% 99% 98%   Weight change: 4.8 oz (0.136 kg)  Intake/Output Summary (Last 24 hours) at 04/20/15 1527 Last data filed at 04/20/15 1424  Gross per 24 hour  Intake 2214.45 ml  Output      0 ml  Net 2214.45 ml     General: Alert, awake, oriented x3, in mild distress secondary to pain.  HEENT: [place]/AT PEERL, EOMI, anicteric Neck: Trachea midline,  no masses, no thyromegal,y no JVD, no carotid bruit OROPHARYNX:  Moist, No exudate/ erythema/lesions.  Heart: Regular rate and rhythm, without murmurs, rubs, gallops, PMI non-displaced, no heaves or thrills on palpation.  Lungs: Clear to auscultation, no wheezing or rhonchi noted. No increased vocal fremitus resonant to percussion  Abdomen: Soft, nontender, nondistended, positive bowel sounds, no masses no hepatosplenomegaly noted.  Neuro: No focal neurological deficits noted cranial nerves II through XII grossly intact.  Strength at functional baseline in bilateral upper and lower extremities. Musculoskeletal: No warm swelling or erythema around joints, no spinal tenderness noted. Psychiatric: Patient alert and oriented x3, good insight and cognition, good recent to remote recall.    Data Reviewed: Basic Metabolic Panel:  Recent Labs Lab 04/15/15 0510 04/17/15 0355 04/19/15 0520  NA 135 139 134*  K 3.5 3.8 3.8  CL 105 108 104  CO2 _1 GLUCOSE 112* 93 119*  BUN <5* 5* 6  CREATININE 0.46 0.44 0.40*  CALCIUM 8.9 9.3 8.8*   Liver Function Tests:  Recent Labs Lab 04/19/15 0520  AST 15  ALT 13*  ALKPHOS 38  BILITOT  2.3*  PROT 6.5  ALBUMIN 3.6   No results for input(s): LIPASE, AMYLASE in the last 168 hours. No results for input(s): AMMONIA in the last 168 hours. CBC:  Recent Labs Lab 04/15/15 0510 04/17/15 0355 04/19/15 0520  WBC 19.2* 17.6* 16.7*  NEUTROABS 14.0* 12.8* 12.3*  HGB 7.8* 7.2* 6.8*  HCT 22.6* 21.1* 19.4*  MCV 89.3 90.2 89.8  PLT 486* 464* 414*   Cardiac Enzymes: No results for input(s): CKTOTAL, CKMB, CKMBINDEX, TROPONINI in the last 168 hours. BNP (last 3 results) No results for input(s): BNP in the last 8760 hours.  ProBNP (last 3 results) No results for input(s): PROBNP in the last 8760  hours.  CBG: No results for input(s): GLUCAP in the last 168 hours.  No results found for this or any previous visit (from the past 240 hour(s)).   Studies: Ct Head Wo Contrast  04/16/2015  CLINICAL DATA:  [place] tonight with loss of consciousness. Amnestic for the event. Now with supraorbital pain on the left. EXAM: CT HEAD WITHOUT CONTRAST TECHNIQUE: Contiguous axial images were obtained from the base of the skull through the vertex without intravenous contrast. COMPARISON:  None. FINDINGS: There is no intracranial hemorrhage, mass or evidence of acute infarction. There is no extra-axial fluid collection. Gray matter and white matter appear normal. Cerebral volume is normal for age. Brainstem and posterior fossa are unremarkable. The CSF spaces appear normal. The bony structures are intact. The visible portions of the paranasal sinuses are clear. IMPRESSION: Normal brain Electronically Signed   By: Andreas Newport M.D.   On: 04/16/2015 23:05   Ir Fluoro Guide Cv Line Right  04/16/2015  INDICATION: Poor venous access, request for peripherally inserted central catheter placement. EXAM: ULTRASOUND AND FLUOROSCOPIC GUIDED PICC LINE INSERTION MEDICATIONS: None. CONTRAST:  None FLUOROSCOPY TIME:  1 minute 30 seconds. COMPLICATIONS: None immediate TECHNIQUE: The procedure, risks, benefits, and alternatives were explained to the patient and informed written consent was obtained. A timeout was performed prior to the initiation of the procedure. The right upper extremity was prepped with chlorhexidine in a sterile fashion, and a sterile drape was applied covering the operative field. Maximum barrier sterile technique with sterile gowns and gloves were used for the procedure. A timeout was performed prior to the initiation of the procedure. Local anesthesia was provided with 1% lidocaine. Under direct ultrasound guidance, the right brachial vein was accessed with a micropuncture kit after the overlying soft  tissues were anesthetized with 1% lidocaine. An ultrasound image was saved for documentation purposes. A guidewire was advanced to the level of the superior caval-atrial junction for measurement purposes and the PICC line was cut to length. A peel-away sheath was placed and a 34 cm, 5 [place], dual lumen was inserted to level of the superior caval-atrial junction. A post procedure spot fluoroscopic was obtained. The catheter easily aspirated and flushed and was sutured in place. A dressing was placed. The patient tolerated the procedure well without immediate post procedural complication. FINDINGS: After catheter placement, the tip lies within the superior cavoatrial junction. The catheter aspirates and flushes normally and is ready for immediate use. IMPRESSION: Successful ultrasound and fluoroscopic guided placement of a right brachial vein approach, 34 cm, 5 [place], dual lumen power injectable PICC with tip at the superior caval-atrial junction. The PICC line is ready for immediate use. Read By:  Tsosie Billing PA-C Electronically Signed   By: Aletta Edouard M.D.   On: 04/16/2015 09:41   Ir US Guide Vasc  Access Right  04/16/2015  INDICATION: Poor venous access, request for peripherally inserted central catheter placement. EXAM: ULTRASOUND AND FLUOROSCOPIC GUIDED PICC LINE INSERTION MEDICATIONS: None. CONTRAST:  None FLUOROSCOPY TIME:  1 minute 30 seconds. COMPLICATIONS: None immediate TECHNIQUE: The procedure, risks, benefits, and alternatives were explained to the patient and informed written consent was obtained. A timeout was performed prior to the initiation of the procedure. The right upper extremity was prepped with chlorhexidine in a sterile fashion, and a sterile drape was applied covering the operative field. Maximum barrier sterile technique with sterile gowns and gloves were used for the procedure. A timeout was performed prior to the initiation of the procedure. Local anesthesia was provided with 1%  lidocaine. Under direct ultrasound guidance, the right brachial vein was accessed with a micropuncture kit after the overlying soft tissues were anesthetized with 1% lidocaine. An ultrasound image was saved for documentation purposes. A guidewire was advanced to the level of the superior caval-atrial junction for measurement purposes and the PICC line was cut to length. A peel-away sheath was placed and a 34 cm, 5 [place], dual lumen was inserted to level of the superior caval-atrial junction. A post procedure spot fluoroscopic was obtained. The catheter easily aspirated and flushed and was sutured in place. A dressing was placed. The patient tolerated the procedure well without immediate post procedural complication. FINDINGS: After catheter placement, the tip lies within the superior cavoatrial junction. The catheter aspirates and flushes normally and is ready for immediate use. IMPRESSION: Successful ultrasound and fluoroscopic guided placement of a right brachial vein approach, 34 cm, 5 [place], dual lumen power injectable PICC with tip at the superior caval-atrial junction. The PICC line is ready for immediate use. Read By:  Tsosie Billing PA-C Electronically Signed   By: Aletta Edouard M.D.   On: 04/16/2015 09:41    Scheduled Meds: . sodium chloride   Intravenous Once  . enoxaparin (LOVENOX) injection  40 mg Subcutaneous Q24H  . HYDROmorphone   Intravenous 6 times per day  . methadone  5 mg Oral QHS  . senna-docusate  1 tablet Oral BID   Continuous Infusions: . dextrose 5 % and 0.45% NaCl 10 mL/hr at 04/18/15 2054    Time spent 25 minutes

## 2015-04-20 NOTE — Progress Notes (Signed)
Pt's IV beeping, occluded on patient side. Assesed PICC site and pt had peeled dressing all the way up to the insertion site, leaving it exposed. Catheter itself remained in place. She stated it was itching and she had been scratching and she didn't realize she pulled the dressing off that much. I applied new dressing using sterile technique and explained to her why it is important to keep the PICC dressing in place to prevent central line infection. She verbalized understanding. Hortencia Conradi RN

## 2015-04-21 ENCOUNTER — Other Ambulatory Visit: Payer: Self-pay | Admitting: Internal Medicine

## 2015-04-21 DIAGNOSIS — G8929 Other chronic pain: Secondary | ICD-10-CM

## 2015-04-21 DIAGNOSIS — M549 Dorsalgia, unspecified: Secondary | ICD-10-CM

## 2015-04-21 DIAGNOSIS — D571 Sickle-cell disease without crisis: Secondary | ICD-10-CM

## 2015-04-21 DIAGNOSIS — K5909 Other constipation: Secondary | ICD-10-CM

## 2015-04-21 LAB — CBC WITH DIFFERENTIAL/PLATELET
BASOS ABS: 0.1 10*3/uL (ref 0.0–0.1)
Basophils Relative: 0 %
EOS ABS: 0.7 10*3/uL (ref 0.0–0.7)
EOS PCT: 5 %
HCT: 24.4 % — ABNORMAL LOW (ref 36.0–46.0)
Hemoglobin: 8.4 g/dL — ABNORMAL LOW (ref 12.0–15.0)
LYMPHS ABS: 2.2 10*3/uL (ref 0.7–4.0)
Lymphocytes Relative: 16 %
MCH: 30.9 pg (ref 26.0–34.0)
MCHC: 34.4 g/dL (ref 30.0–36.0)
MCV: 89.7 fL (ref 78.0–100.0)
Monocytes Absolute: 1.6 10*3/uL — ABNORMAL HIGH (ref 0.1–1.0)
Monocytes Relative: 12 %
Neutro Abs: 9.1 10*3/uL — ABNORMAL HIGH (ref 1.7–7.7)
Neutrophils Relative %: 67 %
PLATELETS: 424 10*3/uL — AB (ref 150–400)
RBC: 2.72 MIL/uL — AB (ref 3.87–5.11)
RDW: 16.6 % — ABNORMAL HIGH (ref 11.5–15.5)
WBC: 13.6 10*3/uL — AB (ref 4.0–10.5)

## 2015-04-21 LAB — TYPE AND SCREEN
ABO/RH(D): B POS
ANTIBODY SCREEN: NEGATIVE
DONOR AG TYPE: NEGATIVE
UNIT DIVISION: 0

## 2015-04-21 MED ORDER — OXYCODONE HCL 5 MG PO TABS
15.0000 mg | ORAL_TABLET | ORAL | Status: DC
  Administered 2015-04-21 – 2015-04-22 (×7): 15 mg via ORAL
  Filled 2015-04-21 (×7): qty 3

## 2015-04-21 MED ORDER — OXYCODONE HCL 15 MG PO TABS: 15.0000 mg | ORAL_TABLET | ORAL | Status: DC | PRN

## 2015-04-21 NOTE — Progress Notes (Signed)
SICKLE CELL SERVICE PROGRESS NOTE  [name]:[id number] DOB: 25-Jul-1990 DOA: 04/14/2015 PCP: Angelica Chessman, MD  Assessment/Plan: Active Problems:   Sickle cell anemia with pain ([name])   Sickle cell anemia with crisis ([name])  1. Hb SS with crisis: Pt's pain has improved since transfusion yesterday. She has been able to ambulate in the halls an take a shower today. She has used 27.5 mg with 57/56:demands/deliveries on the last 24 hours. I will discontinue clinician assiited doses and schedule oxycodone 15 mg every 4 hours and continue PCA for PRN use. Re-evaluate for possible discharge home tomorrow. 2. Early Pregnancy: After transfusion Hb now at 8.4 g/dl at goal for pregnant paitetn with SCD. 3. Leukocytosis: Improved. No evidence of infection. I suspect that this is related to her crisis.  4. Anemia of chronic disease: P Pt was noted to have a high % of Hb S about 1 month ago. In light of her pregnancy the target Hb is a level of > 8 g/dl and Hb S% <30%. She has received a transfusion of one unit RBC's. 5. Chronic pain: Currently on Methadone 5 mg q HS. Continue. 6. Constipation: Pt has not had a BM in 2 days. SHe will request Miralax if no BM by end of the day today.   Code Status: Full Code Family Communication: N/A Disposition Plan: Not yet ready for discharge  [place].  Pager [phone]. If 7PM-7AM, please contact night-coverage.  04/21/2015, 3:28 PM  LOS: 7 days   Interim History: Pain is localized to her right knee and leg but significantly improved since yesterday. Last BM 2 days ago..  Consultants:  None  Procedures:  None  Antibiotics:  None   Objective: Filed Vitals:   04/21/15 0800 04/21/15 0947 04/21/15 1222 04/21/15 1321  BP:  102/66  130/78  Pulse:  91  83  Temp:  98 F (36.7 C)  98.4 F (36.9 C)  TempSrc:  Oral  Oral  Resp: _0 Height:      Weight:      SpO2: 100% 100% 100% 100%   Weight change:   Intake/Output  Summary (Last 24 hours) at 04/21/15 1528 Last data filed at 04/21/15 0947  Gross per 24 hour  Intake    830 ml  Output      0 ml  Net    830 ml    General: Alert, awake, oriented x3, resting comfortably. HEENT: [place]/AT PEERL, EOMI, anicteric Neck: Trachea midline,  no masses, no thyromegal,y no JVD, no carotid bruit OROPHARYNX:  Moist, No exudate/ erythema/lesions.  Heart: Regular rate and rhythm, without murmurs, rubs, gallops, PMI non-displaced, no heaves or thrills on palpation.  Lungs: Clear to auscultation, no wheezing or rhonchi noted. No increased vocal fremitus resonant to percussion  Abdomen: Soft, nontender, nondistended, positive bowel sounds, no masses no hepatosplenomegaly noted.  Neuro: No focal neurological deficits noted cranial nerves II through XII grossly intact.  Strength at functional baseline in bilateral upper and lower extremities. Musculoskeletal: No warm swelling or erythema around joints, no spinal tenderness noted. Psychiatric: Patient alert and oriented x3, good insight and cognition, good recent to remote recall.    Data Reviewed: Basic Metabolic Panel:  Recent Labs Lab 04/15/15 0510 04/17/15 0355 04/19/15 0520  NA 135 139 134*  K 3.5 3.8 3.8  CL 105 108 104  CO2 _1 GLUCOSE 112* 93 119*  BUN <5* 5* 6  CREATININE 0.46 0.44 0.40*  CALCIUM 8.9 9.3  8.8*   Liver Function Tests:  Recent Labs Lab 04/19/15 0520  AST 15  ALT 13*  ALKPHOS 38  BILITOT 2.3*  PROT 6.5  ALBUMIN 3.6   No results for input(s): LIPASE, AMYLASE in the last 168 hours. No results for input(s): AMMONIA in the last 168 hours. CBC:  Recent Labs Lab 04/15/15 0510 04/17/15 0355 04/19/15 0520 04/21/15 1220  WBC 19.2* 17.6* 16.7* 13.6*  NEUTROABS 14.0* 12.8* 12.3* 9.1*  HGB 7.8* 7.2* 6.8* 8.4*  HCT 22.6* 21.1* 19.4* 24.4*  MCV 89.3 90.2 89.8 89.7  PLT 486* 464* 414* 424*   Cardiac Enzymes: No results for input(s): CKTOTAL, CKMB, CKMBINDEX, TROPONINI in the  last 168 hours. BNP (last 3 results) No results for input(s): BNP in the last 8760 hours.  ProBNP (last 3 results) No results for input(s): PROBNP in the last 8760 hours.  CBG: No results for input(s): GLUCAP in the last 168 hours.  No results found for this or any previous visit (from the past 240 hour(s)).   Studies: Ct Head Wo Contrast  04/16/2015  CLINICAL DATA:  [place] tonight with loss of consciousness. Amnestic for the event. Now with supraorbital pain on the left. EXAM: CT HEAD WITHOUT CONTRAST TECHNIQUE: Contiguous axial images were obtained from the base of the skull through the vertex without intravenous contrast. COMPARISON:  None. FINDINGS: There is no intracranial hemorrhage, mass or evidence of acute infarction. There is no extra-axial fluid collection. Gray matter and white matter appear normal. Cerebral volume is normal for age. Brainstem and posterior fossa are unremarkable. The CSF spaces appear normal. The bony structures are intact. The visible portions of the paranasal sinuses are clear. IMPRESSION: Normal brain Electronically Signed   By: Andreas Newport M.D.   On: 04/16/2015 23:05   Ir Fluoro Guide Cv Line Right  04/16/2015  INDICATION: Poor venous access, request for peripherally inserted central catheter placement. EXAM: ULTRASOUND AND FLUOROSCOPIC GUIDED PICC LINE INSERTION MEDICATIONS: None. CONTRAST:  None FLUOROSCOPY TIME:  1 minute 30 seconds. COMPLICATIONS: None immediate TECHNIQUE: The procedure, risks, benefits, and alternatives were explained to the patient and informed written consent was obtained. A timeout was performed prior to the initiation of the procedure. The right upper extremity was prepped with chlorhexidine in a sterile fashion, and a sterile drape was applied covering the operative field. Maximum barrier sterile technique with sterile gowns and gloves were used for the procedure. A timeout was performed prior to the initiation of the procedure. Local  anesthesia was provided with 1% lidocaine. Under direct ultrasound guidance, the right brachial vein was accessed with a micropuncture kit after the overlying soft tissues were anesthetized with 1% lidocaine. An ultrasound image was saved for documentation purposes. A guidewire was advanced to the level of the superior caval-atrial junction for measurement purposes and the PICC line was cut to length. A peel-away sheath was placed and a 34 cm, 5 [place], dual lumen was inserted to level of the superior caval-atrial junction. A post procedure spot fluoroscopic was obtained. The catheter easily aspirated and flushed and was sutured in place. A dressing was placed. The patient tolerated the procedure well without immediate post procedural complication. FINDINGS: After catheter placement, the tip lies within the superior cavoatrial junction. The catheter aspirates and flushes normally and is ready for immediate use. IMPRESSION: Successful ultrasound and fluoroscopic guided placement of a right brachial vein approach, 34 cm, 5 [place], dual lumen power injectable PICC with tip at the superior caval-atrial junction. The PICC line  is ready for immediate use. Read By:  Tsosie Billing PA-C Electronically Signed   By: Aletta Edouard M.D.   On: 04/16/2015 09:41   Ir US Guide Vasc Access Right  04/16/2015  INDICATION: Poor venous access, request for peripherally inserted central catheter placement. EXAM: ULTRASOUND AND FLUOROSCOPIC GUIDED PICC LINE INSERTION MEDICATIONS: None. CONTRAST:  None FLUOROSCOPY TIME:  1 minute 30 seconds. COMPLICATIONS: None immediate TECHNIQUE: The procedure, risks, benefits, and alternatives were explained to the patient and informed written consent was obtained. A timeout was performed prior to the initiation of the procedure. The right upper extremity was prepped with chlorhexidine in a sterile fashion, and a sterile drape was applied covering the operative field. Maximum barrier sterile  technique with sterile gowns and gloves were used for the procedure. A timeout was performed prior to the initiation of the procedure. Local anesthesia was provided with 1% lidocaine. Under direct ultrasound guidance, the right brachial vein was accessed with a micropuncture kit after the overlying soft tissues were anesthetized with 1% lidocaine. An ultrasound image was saved for documentation purposes. A guidewire was advanced to the level of the superior caval-atrial junction for measurement purposes and the PICC line was cut to length. A peel-away sheath was placed and a 34 cm, 5 [place], dual lumen was inserted to level of the superior caval-atrial junction. A post procedure spot fluoroscopic was obtained. The catheter easily aspirated and flushed and was sutured in place. A dressing was placed. The patient tolerated the procedure well without immediate post procedural complication. FINDINGS: After catheter placement, the tip lies within the superior cavoatrial junction. The catheter aspirates and flushes normally and is ready for immediate use. IMPRESSION: Successful ultrasound and fluoroscopic guided placement of a right brachial vein approach, 34 cm, 5 [place], dual lumen power injectable PICC with tip at the superior caval-atrial junction. The PICC line is ready for immediate use. Read By:  Tsosie Billing PA-C Electronically Signed   By: Aletta Edouard M.D.   On: 04/16/2015 09:41    Scheduled Meds: . sodium chloride   Intravenous Once  . sodium chloride   Intravenous Once  . enoxaparin (LOVENOX) injection  40 mg Subcutaneous Q24H  . HYDROmorphone   Intravenous 6 times per day  . methadone  5 mg Oral QHS  . oxyCODONE  15 mg Oral Q4H  . senna-docusate  1 tablet Oral BID   Continuous Infusions: . dextrose 5 % and 0.45% NaCl 10 mL/hr at 04/20/15 2105    Time spent 25 minutes

## 2015-04-22 NOTE — Care Management Important Message (Signed)
Important Message  Patient Details  Name: [name] MRN: [id number] Date of Birth: 04-02-1991   Medicare Important Message Given:  Yes    Camillo Flaming 04/22/2015, 12:42 [place] Message  Patient Details  Name: [name] MRN: [id number] Date of Birth: [birth year]   Medicare Important Message Given:  Yes    Camillo Flaming 04/22/2015, 12:42 PM

## 2015-04-22 NOTE — Progress Notes (Signed)
Nursing Discharge Summary  Patient ID: [name] MRN: [id number] DOB/AGE: [birth year] 23 y.o.  Admit date: 04/14/2015 Discharge date: 04/22/2015  Discharged Condition: good  Disposition: 01-Home or Self Care    Prescriptions Given: Prescription given for Oxycodone from sickle cell center nurse Rew.  Patient verbalized understanding of follow up appointments and medications without further questions.   Means of Discharge: Patient to be taken downstairs via wheelchair to be discharged home.  Signed: Buel Ream 04/22/2015, 4:54 PM

## 2015-04-22 NOTE — Progress Notes (Signed)
Patient has had a fall this admission so bed alarm has been in place.  Bed alarm went off and patient witnessed turning off bed alarm.  Patient educated on safety awareness. Patient verbalized understanding.  Will continue to monitor.

## 2015-04-29 ENCOUNTER — Telehealth: Payer: Self-pay | Admitting: Internal Medicine

## 2015-04-29 NOTE — Telephone Encounter (Signed)
Methadone and oxycodone  Refill request. LOV 04/07/2015. Please advise. Thanks!

## 2015-05-01 ENCOUNTER — Other Ambulatory Visit: Payer: Self-pay | Admitting: Internal Medicine

## 2015-05-01 DIAGNOSIS — G8929 Other chronic pain: Secondary | ICD-10-CM

## 2015-05-01 MED ORDER — METHADONE HCL 5 MG PO TABS: 5.0000 mg | ORAL_TABLET | Freq: Every day | ORAL | Status: DC

## 2015-05-02 ENCOUNTER — Inpatient Hospital Stay (HOSPITAL_COMMUNITY)
Admission: EM | Admit: 2015-05-02 | Discharge: 2015-05-08 | DRG: 781 | Disposition: A | Discharge status: Home / Self Care | Payer: Medicare Other | Attending: Internal Medicine | Admitting: Internal Medicine

## 2015-05-02 ENCOUNTER — Emergency Department (HOSPITAL_COMMUNITY): Payer: Medicare Other

## 2015-05-02 ENCOUNTER — Encounter (HOSPITAL_COMMUNITY): Payer: Self-pay | Admitting: Emergency Medicine

## 2015-05-02 DIAGNOSIS — D72829 Elevated white blood cell count, unspecified: Secondary | ICD-10-CM | POA: Diagnosis present

## 2015-05-02 DIAGNOSIS — Z3A13 13 weeks gestation of pregnancy: Secondary | ICD-10-CM | POA: Diagnosis not present

## 2015-05-02 DIAGNOSIS — D5700 Hb-SS disease with crisis, unspecified: Secondary | ICD-10-CM | POA: Diagnosis present

## 2015-05-02 DIAGNOSIS — D571 Sickle-cell disease without crisis: Secondary | ICD-10-CM | POA: Diagnosis not present

## 2015-05-02 DIAGNOSIS — E860 Dehydration: Secondary | ICD-10-CM

## 2015-05-02 DIAGNOSIS — O99011 Anemia complicating pregnancy, first trimester: Principal | ICD-10-CM | POA: Diagnosis present

## 2015-05-02 DIAGNOSIS — R109 Unspecified abdominal pain: Secondary | ICD-10-CM

## 2015-05-02 DIAGNOSIS — Z3490 Encounter for supervision of normal pregnancy, unspecified, unspecified trimester: Secondary | ICD-10-CM

## 2015-05-02 DIAGNOSIS — O26899 Other specified pregnancy related conditions, unspecified trimester: Secondary | ICD-10-CM

## 2015-05-02 DIAGNOSIS — E876 Hypokalemia: Secondary | ICD-10-CM | POA: Diagnosis present

## 2015-05-02 DIAGNOSIS — R1110 Vomiting, unspecified: Secondary | ICD-10-CM

## 2015-05-02 DIAGNOSIS — E872 Acidosis, unspecified: Secondary | ICD-10-CM | POA: Diagnosis present

## 2015-05-02 DIAGNOSIS — R102 Pelvic and perineal pain: Secondary | ICD-10-CM | POA: Diagnosis not present

## 2015-05-02 DIAGNOSIS — Z331 Pregnant state, incidental: Secondary | ICD-10-CM | POA: Diagnosis not present

## 2015-05-02 DIAGNOSIS — R112 Nausea with vomiting, unspecified: Secondary | ICD-10-CM | POA: Diagnosis not present

## 2015-05-02 DIAGNOSIS — D638 Anemia in other chronic diseases classified elsewhere: Secondary | ICD-10-CM | POA: Diagnosis not present

## 2015-05-02 DIAGNOSIS — K5901 Slow transit constipation: Secondary | ICD-10-CM | POA: Diagnosis not present

## 2015-05-02 DIAGNOSIS — R110 Nausea: Secondary | ICD-10-CM | POA: Diagnosis not present

## 2015-05-02 DIAGNOSIS — O26891 Other specified pregnancy related conditions, first trimester: Secondary | ICD-10-CM | POA: Diagnosis not present

## 2015-05-02 DIAGNOSIS — O211 Hyperemesis gravidarum with metabolic disturbance: Secondary | ICD-10-CM | POA: Diagnosis not present

## 2015-05-02 LAB — CBC WITH DIFFERENTIAL/PLATELET
Basophils Absolute: 0 10*3/uL (ref 0.0–0.1)
Basophils Relative: 0 %
EOS ABS: 0.3 10*3/uL (ref 0.0–0.7)
EOS PCT: 1 %
HCT: 30.1 % — ABNORMAL LOW (ref 36.0–46.0)
HEMOGLOBIN: 10.5 g/dL — AB (ref 12.0–15.0)
LYMPHS PCT: 6 %
Lymphs Abs: 1.7 10*3/uL (ref 0.7–4.0)
MCH: 31.2 pg (ref 26.0–34.0)
MCHC: 34.9 g/dL (ref 30.0–36.0)
MCV: 89.3 fL (ref 78.0–100.0)
MONO ABS: 2 10*3/uL — AB (ref 0.1–1.0)
Monocytes Relative: 7 %
NEUTROS PCT: 86 %
Neutro Abs: 24.1 10*3/uL — ABNORMAL HIGH (ref 1.7–7.7)
Platelets: 549 10*3/uL — ABNORMAL HIGH (ref 150–400)
RBC: 3.37 MIL/uL — AB (ref 3.87–5.11)
RDW: 16.2 % — AB (ref 11.5–15.5)
WBC: 28.1 10*3/uL — AB (ref 4.0–10.5)

## 2015-05-02 LAB — URINE MICROSCOPIC-ADD ON
BACTERIA UA: NONE SEEN
RBC / HPF: NONE SEEN RBC/hpf (ref 0–5)

## 2015-05-02 LAB — COMPREHENSIVE METABOLIC PANEL
ALBUMIN: 4.7 g/dL (ref 3.5–5.0)
ALK PHOS: 49 U/L (ref 38–126)
ALT: 24 U/L (ref 14–54)
AST: 36 U/L (ref 15–41)
Anion gap: 12 (ref 5–15)
BUN: 7 mg/dL (ref 6–20)
CALCIUM: 9.2 mg/dL (ref 8.9–10.3)
CHLORIDE: 109 mmol/L (ref 101–111)
CO2: 19 mmol/L — AB (ref 22–32)
CREATININE: 0.5 mg/dL (ref 0.44–1.00)
GFR calc non Af Amer: >60 mL/min (ref >60)
GLUCOSE: 116 mg/dL — AB (ref 65–99)
Potassium: 3.5 mmol/L (ref 3.5–5.1)
SODIUM: 140 mmol/L (ref 135–145)
Total Bilirubin: 2.1 mg/dL — ABNORMAL HIGH (ref 0.3–1.2)
Total Protein: 8.2 g/dL — ABNORMAL HIGH (ref 6.5–8.1)

## 2015-05-02 LAB — URINALYSIS, ROUTINE W REFLEX MICROSCOPIC
BILIRUBIN URINE: NEGATIVE
GLUCOSE, UA: NEGATIVE mg/dL
Hgb urine dipstick: NEGATIVE
KETONES UR: NEGATIVE mg/dL
Nitrite: NEGATIVE
PH: 6 (ref 5.0–8.0)
Protein, ur: NEGATIVE mg/dL
Specific Gravity, Urine: 1.011 (ref 1.005–1.030)

## 2015-05-02 LAB — HCG, QUANTITATIVE, PREGNANCY: hCG, Beta Chain, Quant, S: 31546 m[IU]/mL — ABNORMAL HIGH (ref <5)

## 2015-05-02 LAB — RETICULOCYTES
RBC.: 3.37 MIL/uL — AB (ref 3.87–5.11)
RETIC COUNT ABSOLUTE: 377.4 10*3/uL — AB (ref 19.0–186.0)
RETIC CT PCT: 11.2 % — AB (ref 0.4–3.1)

## 2015-05-02 LAB — LIPASE, BLOOD: Lipase: 47 U/L (ref 11–51)

## 2015-05-02 LAB — LACTATE DEHYDROGENASE: LDH: 236 U/L — AB (ref 98–192)

## 2015-05-02 MED ORDER — PROMETHAZINE HCL 25 MG/ML IJ SOLN
12.5000 mg | Freq: Once | INTRAMUSCULAR | Status: AC
  Administered 2015-05-02: 12.5 mg via INTRAVENOUS
  Filled 2015-05-02: qty 1

## 2015-05-02 MED ORDER — ENOXAPARIN SODIUM 40 MG/0.4ML ~~LOC~~ SOLN
40.0000 mg | SUBCUTANEOUS | Status: DC
  Administered 2015-05-02 – 2015-05-07 (×5): 40 mg via SUBCUTANEOUS
  Filled 2015-05-02 (×6): qty 0.4

## 2015-05-02 MED ORDER — SODIUM CHLORIDE 0.9 % IV SOLN
25.0000 mg | INTRAVENOUS | Status: DC | PRN
  Administered 2015-05-02: 25 mg via INTRAVENOUS
  Filled 2015-05-02 (×2): qty 0.5

## 2015-05-02 MED ORDER — HYDROMORPHONE HCL 2 MG/ML IJ SOLN
INTRAMUSCULAR | Status: AC
  Administered 2015-05-02: 2 mg
  Filled 2015-05-02: qty 1

## 2015-05-02 MED ORDER — PROMETHAZINE HCL 25 MG/ML IJ SOLN
12.5000 mg | Freq: Once | INTRAMUSCULAR | Status: DC
  Filled 2015-05-02: qty 1

## 2015-05-02 MED ORDER — NALOXONE HCL 0.4 MG/ML IJ SOLN: 0.4000 mg | INTRAMUSCULAR | Status: DC | PRN

## 2015-05-02 MED ORDER — SODIUM CHLORIDE 0.45 % IV SOLN
INTRAVENOUS | Status: AC
  Administered 2015-05-02: 17:00:00 via INTRAVENOUS

## 2015-05-02 MED ORDER — HYDROMORPHONE HCL 2 MG/ML IJ SOLN
2.0000 mg | INTRAMUSCULAR | Status: DC
  Administered 2015-05-02 – 2015-05-04 (×14): 2 mg via INTRAVENOUS
  Filled 2015-05-02 (×15): qty 1

## 2015-05-02 MED ORDER — METHADONE HCL 5 MG PO TABS
5.0000 mg | ORAL_TABLET | Freq: Every day | ORAL | Status: DC
  Administered 2015-05-02 – 2015-05-07 (×6): 5 mg via ORAL
  Filled 2015-05-02 (×6): qty 1

## 2015-05-02 MED ORDER — SODIUM CHLORIDE 0.9 % IV BOLUS (SEPSIS)
1000.0000 mL | Freq: Once | INTRAVENOUS | Status: AC
  Administered 2015-05-02: 1000 mL via INTRAVENOUS

## 2015-05-02 MED ORDER — SENNOSIDES-DOCUSATE SODIUM 8.6-50 MG PO TABS
1.0000 | ORAL_TABLET | Freq: Two times a day (BID) | ORAL | Status: DC
  Administered 2015-05-02 – 2015-05-08 (×12): 1 via ORAL
  Filled 2015-05-02 (×12): qty 1

## 2015-05-02 MED ORDER — HYDROMORPHONE HCL 2 MG/ML IJ SOLN
2.0000 mg | Freq: Once | INTRAMUSCULAR | Status: AC
  Administered 2015-05-02: 2 mg via INTRAVENOUS
  Filled 2015-05-02: qty 1

## 2015-05-02 MED ORDER — HYDROMORPHONE 1 MG/ML IV SOLN
INTRAVENOUS | Status: DC
  Administered 2015-05-02: 18:00:00 via INTRAVENOUS
  Administered 2015-05-02: 1.5 mg via INTRAVENOUS
  Administered 2015-05-02: 4 mg via INTRAVENOUS
  Administered 2015-05-03: 16:00:00 via INTRAVENOUS
  Administered 2015-05-03: 5.5 mg via INTRAVENOUS
  Administered 2015-05-03: 5 mg via INTRAVENOUS
  Administered 2015-05-03 (×2): 3.5 mg via INTRAVENOUS
  Administered 2015-05-03: 5 mg via INTRAVENOUS
  Administered 2015-05-04: 17:00:00 via INTRAVENOUS
  Administered 2015-05-04: 2.5 mg via INTRAVENOUS
  Administered 2015-05-04: 4.5 mg via INTRAVENOUS
  Administered 2015-05-04: 4 mg via INTRAVENOUS
  Administered 2015-05-04: 5 mg via INTRAVENOUS
  Administered 2015-05-05: 2 mg via INTRAVENOUS
  Administered 2015-05-05: 2.5 mg via INTRAVENOUS
  Administered 2015-05-05: 4.5 mg via INTRAVENOUS
  Administered 2015-05-05: 4 mg via INTRAVENOUS
  Administered 2015-05-06: 3 mg via INTRAVENOUS
  Administered 2015-05-06: 7 mg via INTRAVENOUS
  Administered 2015-05-06: 0.5 mg via INTRAVENOUS
  Administered 2015-05-06: 9.5 mg via INTRAVENOUS
  Administered 2015-05-06: 4 mg via INTRAVENOUS
  Administered 2015-05-06 (×2): via INTRAVENOUS
  Administered 2015-05-06 – 2015-05-07 (×2): 5.5 mg via INTRAVENOUS
  Administered 2015-05-07: 3.5 mg via INTRAVENOUS
  Administered 2015-05-07: 5 mg via INTRAVENOUS
  Administered 2015-05-07: 17:00:00 via INTRAVENOUS
  Administered 2015-05-07: 4.5 mg via INTRAVENOUS
  Administered 2015-05-07: 2 mg via INTRAVENOUS
  Administered 2015-05-08 (×2): 0.5 mg via INTRAVENOUS
  Administered 2015-05-08 (×2): 10 mg via INTRAVENOUS
  Filled 2015-05-02 (×6): qty 25

## 2015-05-02 MED ORDER — ONDANSETRON HCL 4 MG/2ML IJ SOLN
4.0000 mg | Freq: Once | INTRAMUSCULAR | Status: AC
  Administered 2015-05-02: 4 mg via INTRAVENOUS
  Filled 2015-05-02: qty 2

## 2015-05-02 MED ORDER — SODIUM CHLORIDE 0.9 % IJ SOLN: 9.0000 mL | INTRAMUSCULAR | Status: DC | PRN

## 2015-05-02 MED ORDER — ONDANSETRON HCL 4 MG/2ML IJ SOLN
4.0000 mg | Freq: Four times a day (QID) | INTRAMUSCULAR | Status: DC | PRN
  Administered 2015-05-02 – 2015-05-04 (×3): 4 mg via INTRAVENOUS
  Filled 2015-05-02 (×3): qty 2

## 2015-05-02 MED ORDER — FOLIC ACID 1 MG PO TABS
1.0000 mg | ORAL_TABLET | Freq: Every day | ORAL | Status: DC
  Administered 2015-05-03 – 2015-05-08 (×6): 1 mg via ORAL
  Filled 2015-05-02 (×7): qty 1

## 2015-05-02 MED ORDER — POLYETHYLENE GLYCOL 3350 17 G PO PACK
17.0000 g | PACK | Freq: Every day | ORAL | Status: DC | PRN
  Administered 2015-05-03: 17 g via ORAL
  Filled 2015-05-02 (×2): qty 1

## 2015-05-02 NOTE — ED Notes (Signed)
Pt made aware of needed urine specimen. Says she is unable to give sample at this time.

## 2015-05-02 NOTE — ED Notes (Signed)
CT in room

## 2015-05-02 NOTE — ED Notes (Signed)
Report given Jenny Reichmann

## 2015-05-02 NOTE — ED Provider Notes (Signed)
CSN: [id number]     Arrival date & time 05/02/15  [id number] History   First MD Initiated Contact with Patient 05/02/15 1011     No chief complaint on file.    (Consider location/radiation/quality/duration/timing/severity/associated sxs/prior Treatment) HPI Comments: Patient is a 24 year old female with history of sickle cell disease. She presents for evaluation of pain in her back, leg, and abdomen she states is consistent with her prior sickle cell crises. She denies any fevers or chills. She reports vomiting and diarrhea.  Patient is a 24 y.o. female presenting with sickle cell pain. The history is provided by the patient.  Sickle Cell Pain Crisis Location:  Back (Right leg) Severity:  Severe Onset quality:  Sudden Duration:  24 hours Similar to previous crisis episodes: yes   Timing:  Constant Progression:  Worsening Chronicity:  New Relieved by:  Nothing Worsened by:  Nothing tried Ineffective treatments:  None tried Associated symptoms: nausea and vomiting   Associated symptoms: no congestion and no headaches     Past Medical History  Diagnosis Date  . Miscarriage 03/22/2011    Pt reports 2 miscarriages.  . Depression 01/06/2011  . GERD (gastroesophageal reflux disease) 02/17/2011  . Trichotillomania     h/o  . Blood transfusion     "lots"  . Sickle cell anemia with crisis ([place])   . Exertional dyspnea     "sometimes"  . Migraines 11/08/11    "@ least twice/month"  . Chronic back pain     "very severe; have knot in my back; from tight muscle; take RX and exercise for it"  . Mood swings ([place]) 11/08/11    "I go back and forth; real bad"  . Sickle cell anemia [facility])    Past Surgical History  Procedure Laterality Date  . Cholecystectomy  05/2010  . Dilation and curettage of uterus  02/20/11    S/P miscarriage   Family History  Problem Relation Age of Onset  . Diabetes Maternal Grandmother   . Diabetes Paternal Grandmother   . Hypertension Paternal Grandmother   .  Diabetes Maternal Grandfather   . Sickle cell trait Mother   . Sickle cell trait Father    Social History  Substance Use Topics  . Smoking status: Former Smoker -- 0.25 packs/day for 1 years    Types: Cigarettes    Quit date: 03/25/2013  . Smokeless tobacco: Never Used  . Alcohol Use: No     Comment: pt states she quit marijuan in May 2013. Rare ETOH, + cigarettes.  She is enrolled in school   OB History    Gravida Para Term Preterm AB TAB SAB Ectopic Multiple Living   3 1 1  1  1   0 1      Obstetric Comments   Miscarried in October 2012 at about 7 weeks     Review of Systems  HENT: Negative for congestion.   Gastrointestinal: Positive for nausea and vomiting.  Neurological: Negative for headaches.  All other systems reviewed and are negative.     Allergies  Carrot; Carrot oil; and Latex  Home Medications   Prior to Admission medications   Medication Sig Start Date End Date Taking? Authorizing Provider  aspirin-acetaminophen-caffeine (EXCEDRIN MIGRAINE) [phone] MG tablet Take 1 tablet by mouth daily as needed for headache.    Historical Provider, MD  folic acid (FOLVITE) 1 MG tablet Take 2 tablets (2 mg total) by mouth daily. Patient not taking: Reported on 04/14/2015 03/26/15   Doyne Keel  Zigmund Daniel, MD  hydroxyurea (HYDREA) 500 MG capsule Take 500 mg by mouth daily. May take with food to minimize GI side effects.    Historical Provider, MD  methadone (DOLOPHINE) 5 MG tablet Take 1 tablet (5 mg total) by mouth at bedtime. 05/01/15   Tresa Garter, MD  oxyCODONE (ROXICODONE) 15 MG immediate release tablet Take 1 tablet (15 mg total) by mouth every 4 (four) hours as needed for pain. 04/21/15   Tresa Garter, MD   BP 118/77 mmHg  Pulse 120  Temp(Src) 98.4 F (36.9 C) (Oral)  Resp 17  SpO2 100%  LMP 02/09/2015 Physical Exam  Constitutional: She is oriented to person, place, and time. She appears well-developed and well-nourished. No distress.  HENT:   Head: Normocephalic and atraumatic.  Neck: Normal range of motion. Neck supple.  Cardiovascular: Normal rate and regular rhythm.  Exam reveals no gallop and no friction rub.   No murmur heard. Pulmonary/Chest: Effort normal and breath sounds normal. No respiratory distress. She has no wheezes.  Abdominal: Soft. Bowel sounds are normal. She exhibits no distension. There is no tenderness.  Musculoskeletal: Normal range of motion.  Neurological: She is alert and oriented to person, place, and time.  Skin: Skin is warm and dry. She is not diaphoretic.  Nursing note and vitals reviewed.   ED Course  Procedures (including critical care time) Labs Review Labs Reviewed  COMPREHENSIVE METABOLIC PANEL  LIPASE, BLOOD  CBC WITH DIFFERENTIAL/PLATELET  RETICULOCYTES  HCG, SERUM, QUALITATIVE  URINALYSIS, ROUTINE W REFLEX MICROSCOPIC (NOT AT [facility])    Imaging Review No results found. I have personally reviewed and evaluated these images and lab results as part of my medical decision-making.   EKG Interpretation None      MDM   Final diagnoses:  None    Patient presents with vomiting with abdominal and back pain. Started earlier today. She reports being pregnant and also states that this is similar with her prior sickle crises. Today's workup reveals an elevated white count and she appears somewhat dry with a CO2 of 19. She was hydrated with normal saline and given the appropriate pain and nausea medications. She continues to not feel well and I believe will require admission. I've spoken with Dr. Zigmund Daniel from the sickle cell clinic who agrees to admit the patient.  She also underwent an ultrasound to evaluate her pregnancy. There are no complicating features and it appears to be a normal IUP with gestational age of approximately 37 weeks.    Veryl Speak, MD 05/02/15 1535

## 2015-05-02 NOTE — ED Notes (Signed)
Pt from home reports emesis x1 day and R knee pain. Pt is actively vomiting at this time. Pt in NAD

## 2015-05-02 NOTE — H&P (Signed)
Hospital Admission Note Date: 05/02/2015  Patient name: [name] Medical record number: [id number] Date of birth: 1990-11-06 Age: 24 y.o. Gender: female PCP: Angelica Chessman, MD  Attending physician: [name], MD  Chief Complaint:Vomiting x 1 day and pain in back and legs x 2 days.  History of Present Illness: [name] is an opiate tolerant patient with Hb SS an dwhi is in her 1st trimester of an IUP (Approximately 13 weeks). Pt states that since she has been pregnant, she has been having emesis almost every morning. However she developed a crisis starting yesterday and has had multiple episodes of emesis too numerous to count. The emesis was intially of food but is now greenish in color. She denies any fevers but does c/o pain in the suprapubic region. She denies dysuria, fevers, chills, Diarrhea. Patient denies any sick contacts.   With regard to her Sickle Cell Crisis, pain is localized to legs and back at an intensity of 9/10. Pain is throbbing and occasionally sharp but non-radiating. She was unable to take her pain medications due to emesis.   In the ED she received 3 doses of Dilaudid and several doses of Phenergan and Zofran. However patient is still having emesis and pain is uncontrolled. She received a2 liter of 0.9 NS in the ED and is presently on D5.45 IVF. A urinalysis has been ordered but patietn has not been able to produce a sample.    Mother at bedside and updated.  Scheduled Meds: . sodium chloride   Intravenous STAT  . enoxaparin (LOVENOX) injection  40 mg Subcutaneous Q24H  . folic acid  1 mg Oral Daily  . HYDROmorphone   Intravenous 6 times per day  . methadone  5 mg Oral QHS  . senna-docusate  1 tablet Oral BID   Continuous Infusions:  PRN Meds:.diphenhydrAMINE (BENADRYL) IVPB(SICKLE CELL ONLY), naloxone **AND** sodium chloride, ondansetron (ZOFRAN) IV, polyethylene glycol Allergies: Carrot; Carrot oil; and Latex Past Medical History   Diagnosis Date  . Miscarriage 03/22/2011    Pt reports 2 miscarriages.  . Depression 01/06/2011  . GERD (gastroesophageal reflux disease) 02/17/2011  . Trichotillomania     h/o  . Blood transfusion     "lots"  . Sickle cell anemia with crisis ([place])   . Exertional dyspnea     "sometimes"  . Migraines 11/08/11    "@ least twice/month"  . Chronic back pain     "very severe; have knot in my back; from tight muscle; take RX and exercise for it"  . Mood swings ([place]) 11/08/11    "I go back and forth; real bad"  . Sickle cell anemia [facility])    Past Surgical History  Procedure Laterality Date  . Cholecystectomy  05/2010  . Dilation and curettage of uterus  02/20/11    S/P miscarriage   Family History  Problem Relation Age of Onset  . Diabetes Maternal Grandmother   . Diabetes Paternal Grandmother   . Hypertension Paternal Grandmother   . Diabetes Maternal Grandfather   . Sickle cell trait Mother   . Sickle cell trait Father    Social History   Social History  . Marital Status: Single    Spouse Name: N/A  . Number of Children: N/A  . Years of Education: N/A   Occupational History  . Not on file.   Social History Main Topics  . Smoking status: Former Smoker -- 0.25 packs/day for 1 years    Types: Cigarettes    Quit date:  03/25/2013  . Smokeless tobacco: Never Used  . Alcohol Use: No     Comment: pt states she quit marijuan in May 2013. Rare ETOH, + cigarettes.  She is enrolled in school  . Drug Use: No  . Sexual Activity: Yes    Birth Control/ Protection: None   Other Topics Concern  . 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 Physical Exam: No intake or output data in the 24 hours ending 05/02/15 1641 General: Alert, awake, oriented x3, in moderate distress and actively  vomiting.  HEENT: [place]/AT PEERL, EOMI, anicteric Neck: Trachea midline,  no masses, no thyromegal,y no JVD, no carotid bruit OROPHARYNX:  Moist, No exudate/ erythema/lesions.  Heart: Regular rate and rhythm, without murmurs, rubs, gallops, PMI non-displaced, no heaves or thrills on palpation.  Lungs: Clear to auscultation, no wheezing or rhonchi noted. No increased vocal fremitus resonant to percussion  Abdomen: Soft gravid abdomen. Pt has supra-pubic tenderness, nondistended, positive bowel sounds, no masses no hepatosplenomegaly noted.  Neuro: No focal neurological deficits noted cranial nerves II through XII grossly intact.  Strength at functional baselinein bilateral upper and lower extremities. Musculoskeletal: No warm swelling or erythema around joints, no spinal tenderness noted. Psychiatric: Patient alert and oriented x3, good insight and cognition, good recent to remote recall.   Lab results:  Recent Labs  05/02/15 1055  NA 140  K 3.5  CL 109  CO2 19*  GLUCOSE 116*  BUN 7  CREATININE 0.50  CALCIUM 9.2    Recent Labs  05/02/15 1055  AST 36  ALT 24  ALKPHOS 49  BILITOT 2.1*  PROT 8.2*  ALBUMIN 4.7    Recent Labs  05/02/15 1055  LIPASE 47    Recent Labs  05/02/15 1055  WBC 28.1*  NEUTROABS 24.1*  HGB 10.5*  HCT 30.1*  MCV 89.3  PLT 549*   No results for input(s): CKTOTAL, CKMB, CKMBINDEX, TROPONINI in the last 72 hours. Invalid input(s): POCBNP No results for input(s): DDIMER in the last 72 hours. No results for input(s): HGBA1C in the last 72 hours. No results for input(s): CHOL, HDL, LDLCALC, TRIG, CHOLHDL, LDLDIRECT in the last 72 hours. No results for input(s): TSH, T4TOTAL, T3FREE, THYROIDAB in the last 72 hours.  Invalid input(s): FREET3  Recent Labs  05/02/15 1055  RETICCTPCT 11.2*   Imaging results:  Ct Head Wo Contrast  04/16/2015  CLINICAL DATA:  [place] tonight with loss of consciousness. Amnestic for the event. Now with supraorbital  pain on the left. EXAM: CT HEAD WITHOUT CONTRAST TECHNIQUE: Contiguous axial images were obtained from the base of the skull through the vertex without intravenous contrast. COMPARISON:  None. FINDINGS: There is no intracranial hemorrhage, mass or evidence of acute infarction. There is no extra-axial fluid collection. Gray matter and white matter appear normal. Cerebral volume is normal for age. Brainstem and posterior fossa are unremarkable. The CSF spaces appear normal. The bony structures are intact. The visible portions of the paranasal sinuses are clear. IMPRESSION: Normal brain Electronically Signed   By: Andreas Newport M.D.   On: 04/16/2015 23:05   US Ob Comp Less 14 Wks  05/02/2015  CLINICAL DATA:  24 year old pregnant female with pelvic pain and vomiting for 1 day. Uncertain dates. EXAM:  LIMITED OBSTETRIC ULTRASOUND FINDINGS: Number of Fetuses: 1 Heart Rate:  150 bpm Movement: Yes Presentation: Transverse head maternal right Placental Location: Posterior Previa: No Amniotic Fluid (Subjective):  Within normal limits. BPD:  2.01cm 13w  1d MATERNAL FINDINGS: Cervix:  Appears closed. Uterus/Adnexae: Ovaries not visualized. No subchorionic hemorrhage, free fluid or adnexal mass. IMPRESSION: Single living intrauterine gestation with estimated gestational age of [redacted] weeks 1 day by this ultrasound. No acute abnormalities identified. This exam is performed on an emergent basis and does not comprehensively evaluate fetal size, dating, or anatomy; follow-up complete OB US should be considered if further fetal assessment is warranted. Electronically Signed   By: Margarette Canada M.D.   On: 05/02/2015 15:06   Ir Fluoro Guide Cv Line Right  04/16/2015  INDICATION: Poor venous access, request for peripherally inserted central catheter placement. EXAM: ULTRASOUND AND FLUOROSCOPIC GUIDED PICC LINE INSERTION MEDICATIONS: None. CONTRAST:  None FLUOROSCOPY TIME:  1 minute 30 seconds. COMPLICATIONS: None immediate  TECHNIQUE: The procedure, risks, benefits, and alternatives were explained to the patient and informed written consent was obtained. A timeout was performed prior to the initiation of the procedure. The right upper extremity was prepped with chlorhexidine in a sterile fashion, and a sterile drape was applied covering the operative field. Maximum barrier sterile technique with sterile gowns and gloves were used for the procedure. A timeout was performed prior to the initiation of the procedure. Local anesthesia was provided with 1% lidocaine. Under direct ultrasound guidance, the right brachial vein was accessed with a micropuncture kit after the overlying soft tissues were anesthetized with 1% lidocaine. An ultrasound image was saved for documentation purposes. A guidewire was advanced to the level of the superior caval-atrial junction for measurement purposes and the PICC line was cut to length. A peel-away sheath was placed and a 34 cm, 5 [place], dual lumen was inserted to level of the superior caval-atrial junction. A post procedure spot fluoroscopic was obtained. The catheter easily aspirated and flushed and was sutured in place. A dressing was placed. The patient tolerated the procedure well without immediate post procedural complication. FINDINGS: After catheter placement, the tip lies within the superior cavoatrial junction. The catheter aspirates and flushes normally and is ready for immediate use. IMPRESSION: Successful ultrasound and fluoroscopic guided placement of a right brachial vein approach, 34 cm, 5 [place], dual lumen power injectable PICC with tip at the superior caval-atrial junction. The PICC line is ready for immediate use. Read By:  Tsosie Billing PA-C Electronically Signed   By: Aletta Edouard M.D.   On: 04/16/2015 09:41   Ir US Guide Vasc Access Right  04/16/2015  INDICATION: Poor venous access, request for peripherally inserted central catheter placement. EXAM: ULTRASOUND AND FLUOROSCOPIC  GUIDED PICC LINE INSERTION MEDICATIONS: None. CONTRAST:  None FLUOROSCOPY TIME:  1 minute 30 seconds. COMPLICATIONS: None immediate TECHNIQUE: The procedure, risks, benefits, and alternatives were explained to the patient and informed written consent was obtained. A timeout was performed prior to the initiation of the procedure. The right upper extremity was prepped with chlorhexidine in a sterile fashion, and a sterile drape was applied covering the operative field. Maximum barrier sterile technique with sterile gowns and gloves were used for the procedure. A timeout was performed prior to the initiation of the procedure. Local anesthesia was provided with 1% lidocaine. Under direct ultrasound guidance, the right brachial vein was accessed with a micropuncture kit after the overlying soft tissues were anesthetized with 1% lidocaine. An ultrasound image was saved  for documentation purposes. A guidewire was advanced to the level of the superior caval-atrial junction for measurement purposes and the PICC line was cut to length. A peel-away sheath was placed and a 34 cm, 5 [place], dual lumen was inserted to level of the superior caval-atrial junction. A post procedure spot fluoroscopic was obtained. The catheter easily aspirated and flushed and was sutured in place. A dressing was placed. The patient tolerated the procedure well without immediate post procedural complication. FINDINGS: After catheter placement, the tip lies within the superior cavoatrial junction. The catheter aspirates and flushes normally and is ready for immediate use. IMPRESSION: Successful ultrasound and fluoroscopic guided placement of a right brachial vein approach, 34 cm, 5 [place], dual lumen power injectable PICC with tip at the superior caval-atrial junction. The PICC line is ready for immediate use. Read By:  Tsosie Billing PA-C Electronically Signed   By: Aletta Edouard M.D.   On: 04/16/2015 09:41    Other results: LIMITED OBSTETRIC  ULTRASOUND IMPRESSION: Single living intrauterine gestation with estimated gestational age of [redacted] weeks 1 day by this ultrasound. No acute abnormalities identified.   Assessment and Plan:  Dehydration: Pt dehydrated secondary to emesis. Will continue IVF. Check I&O's. Likely associated with pregnancy. Will treat symptomatically. Keep on clear liquids.  Hb SS with crisis: Will place on customized PCA regimen. Re-assess pain tomorrow. Continue IVF. Unable to receive NSAID's due to pregnancy.  Metabolic Acidosis: Likely secondary to dehydration and emesis. Will reassess metabolic profile tomorrow.  Leukocytosis: Check Urine for evidence of infection. No other clinical signs to suggest a source of infection. Recheck labs in the morning after hydration  Time spent 1 hour   Brenn Gatton A. 05/02/2015, 4:41 PM

## 2015-05-03 DIAGNOSIS — D638 Anemia in other chronic diseases classified elsewhere: Secondary | ICD-10-CM

## 2015-05-03 DIAGNOSIS — Z331 Pregnant state, incidental: Secondary | ICD-10-CM

## 2015-05-03 LAB — CBC WITH DIFFERENTIAL/PLATELET
BASOS PCT: 0 %
Basophils Absolute: 0 10*3/uL (ref 0.0–0.1)
EOS ABS: 0 10*3/uL (ref 0.0–0.7)
Eosinophils Relative: 0 %
HEMATOCRIT: 22.4 % — AB (ref 36.0–46.0)
HEMOGLOBIN: 7.7 g/dL — AB (ref 12.0–15.0)
LYMPHS ABS: 0.8 10*3/uL (ref 0.7–4.0)
Lymphocytes Relative: 4 %
MCH: 30.4 pg (ref 26.0–34.0)
MCHC: 34.4 g/dL (ref 30.0–36.0)
MCV: 88.5 fL (ref 78.0–100.0)
MONO ABS: 1.3 10*3/uL — AB (ref 0.1–1.0)
MONOS PCT: 7 %
Neutro Abs: 17.8 10*3/uL — ABNORMAL HIGH (ref 1.7–7.7)
Neutrophils Relative %: 89 %
Platelets: 441 10*3/uL — ABNORMAL HIGH (ref 150–400)
RBC: 2.53 MIL/uL — ABNORMAL LOW (ref 3.87–5.11)
RDW: 16.2 % — AB (ref 11.5–15.5)
WBC: 19.9 10*3/uL — ABNORMAL HIGH (ref 4.0–10.5)

## 2015-05-03 LAB — BASIC METABOLIC PANEL
Anion gap: 8 (ref 5–15)
BUN: <5 mg/dL — ABNORMAL LOW (ref 6–20)
CALCIUM: 8.5 mg/dL — AB (ref 8.9–10.3)
CHLORIDE: 108 mmol/L (ref 101–111)
CO2: 22 mmol/L (ref 22–32)
CREATININE: 0.49 mg/dL (ref 0.44–1.00)
GFR calc Af Amer: >60 mL/min (ref >60)
GFR calc non Af Amer: >60 mL/min (ref >60)
GLUCOSE: 110 mg/dL — AB (ref 65–99)
Potassium: 3.4 mmol/L — ABNORMAL LOW (ref 3.5–5.1)
Sodium: 138 mmol/L (ref 135–145)

## 2015-05-03 MED ORDER — SODIUM CHLORIDE 0.45 % IV SOLN
Freq: Once | INTRAVENOUS | Status: AC
  Administered 2015-05-03: 20:00:00 via INTRAVENOUS

## 2015-05-03 MED ORDER — SODIUM CHLORIDE 0.45 % IV SOLN: INTRAVENOUS | Status: DC

## 2015-05-03 NOTE — Progress Notes (Signed)
FHR dopplered by RROB=[id number]

## 2015-05-03 NOTE — Progress Notes (Signed)
05/03/15  [id number]  Patient c/o right arm pain 6/10. Per pt pain has been going on throughout the night. MD notified.

## 2015-05-04 LAB — BASIC METABOLIC PANEL
ANION GAP: 9 (ref 5–15)
BUN: <5 mg/dL — ABNORMAL LOW (ref 6–20)
CALCIUM: 8.5 mg/dL — AB (ref 8.9–10.3)
CO2: 23 mmol/L (ref 22–32)
Chloride: 104 mmol/L (ref 101–111)
Creatinine, Ser: 0.37 mg/dL — ABNORMAL LOW (ref 0.44–1.00)
Glucose, Bld: 97 mg/dL (ref 65–99)
Potassium: 3.4 mmol/L — ABNORMAL LOW (ref 3.5–5.1)
SODIUM: 136 mmol/L (ref 135–145)

## 2015-05-04 LAB — MAGNESIUM: MAGNESIUM: 1.9 mg/dL (ref 1.7–2.4)

## 2015-05-04 MED ORDER — HYDROMORPHONE HCL 2 MG/ML IJ SOLN
2.0000 mg | INTRAMUSCULAR | Status: AC
  Administered 2015-05-04 (×6): 2 mg via INTRAVENOUS
  Filled 2015-05-04 (×6): qty 1

## 2015-05-04 MED ORDER — DEXTROSE-NACL 5-0.45 % IV SOLN
INTRAVENOUS | Status: DC
  Administered 2015-05-04 – 2015-05-07 (×8): via INTRAVENOUS

## 2015-05-04 MED ORDER — POTASSIUM CHLORIDE CRYS ER 20 MEQ PO TBCR
40.0000 meq | EXTENDED_RELEASE_TABLET | Freq: Two times a day (BID) | ORAL | Status: DC
  Administered 2015-05-04 – 2015-05-08 (×9): 40 meq via ORAL
  Filled 2015-05-04 (×9): qty 2

## 2015-05-04 MED ORDER — HYDROMORPHONE HCL 2 MG/ML IJ SOLN
2.0000 mg | INTRAMUSCULAR | Status: AC
Start: 2015-05-05 — End: 2015-05-06
  Administered 2015-05-05 – 2015-05-06 (×11): 2 mg via INTRAVENOUS
  Filled 2015-05-04 (×11): qty 1

## 2015-05-04 NOTE — Progress Notes (Signed)
SICKLE CELL SERVICE PROGRESS NOTE  [name] [id number] DOB: Apr 06, 1991 DOA: 05/02/2015 PCP: Angelica Chessman, MD  Assessment/Plan: Active Problems:   Sickle cell crisis ([name])   Metabolic acidemia, unspecified  1. Hb SS with crisis: Pt has significant pain in RUE without swelling. I suspect that she has sustained a bony infarct causing severe pain. This is best managed concurrently with NSAID's however due to her pregnancy she cannot receive NSAID's.  2. Leukocytosis: Improving but still significantly elevated. She has persistent chest wall pain but no hypoxia. If hypoxia develops will obtain CXR to ensure no acute chest syndrome. 3. Anemia of Chronic Disease: Hb at baseline.  4. 1st Trimester Intrauterine Pregnancy: Pt has not yet initiated care with obstetrics. Continue Folic Acid 2 mg daily. 5. Chronic pain: Continue Methadone.   Code Status: Full Code Family Communication: N/A Disposition Plan: Not yet ready for discharge  [place].  Pager [phone]. If 7PM-7AM, please contact night-coverage.  05/04/2015, 12:08 PM  LOS: 2 days   Interim History: Pt has had no further emesis. Her pain is essentially localized to her RUE at an intensity of 7-8/10. She has some pain in back and legs at a level of 6/10.   Consultants:  None  Procedures:  None  Antibiotics:  None    Objective:  Weight change: 4 lb 14.4 oz (2.223 kg)  Intake/Output Summary (Last 24 hours) at 05/04/15 1208 Last data filed at 05/04/15 [id number]  Gross per 24 hour  Intake 3868.53 ml  Output      0 ml  Net 3868.53 ml    General: Alert, awake, oriented x3, in no acute distress.  Vital Signs: BP 100/65, HR 82, T 98.6 F (37 C), temperature source Oral, RR 11, height 5\' 2"  (1.575 m), weight 174 lb (78.926 kg), last menstrual period 02/09/2015, SpO2 97 %, not currently breastfeeding. HEENT: [place]/AT PEERL, EOMI Neck: Trachea midline,  no masses, no thyromegal,y no JVD, no carotid  bruit OROPHARYNX:  Moist, No exudate/ erythema/lesions.  Heart: Regular rate and rhythm, without murmurs, rubs, gallops, PMI non-displaced, no heaves or thrills on palpation.  Lungs: Clear to auscultation, no wheezing or rhonchi noted. No increased vocal fremitus resonant to percussion  Abdomen: Soft, nontender, nondistended, positive bowel sounds, no masses no hepatosplenomegaly noted.  Neuro: No focal neurological deficits noted cranial nerves II through XII grossly intact.  Strength at functional baseline in bilateral upper and lower extremities. Musculoskeletal: No warm swelling or erythema around joints, no spinal tenderness noted.    Data Reviewed: Basic Metabolic Panel:  Recent Labs Lab 05/02/15 1055 05/03/15 0424 05/04/15 1020  NA 140 138 136  K 3.5 3.4* 3.4*  CL 109 108 104  CO2 19* 22 23  GLUCOSE 116* 110* 97  BUN 7 <5* <5*  CREATININE 0.50 0.49 0.37*  CALCIUM 9.2 8.5* 8.5*  MG  --   --  1.9   Liver Function Tests:  Recent Labs Lab 05/02/15 1055  AST 36  ALT 24  ALKPHOS 49  BILITOT 2.1*  PROT 8.2*  ALBUMIN 4.7    Recent Labs Lab 05/02/15 1055  LIPASE 47   CBC:  Recent Labs Lab 05/02/15 1055 05/03/15 0424  WBC 28.1* 19.9*  NEUTROABS 24.1* 17.8*  HGB 10.5* 7.7*  HCT 30.1* 22.4*  MCV 89.3 88.5  PLT 549* 441*    Studies: Ct Head Wo Contrast  04/16/2015  CLINICAL DATA:  [place] tonight with loss of consciousness. Amnestic for the event. Now with supraorbital pain on  the left. EXAM: CT HEAD WITHOUT CONTRAST TECHNIQUE: Contiguous axial images were obtained from the base of the skull through the vertex without intravenous contrast. COMPARISON:  None. FINDINGS: There is no intracranial hemorrhage, mass or evidence of acute infarction. There is no extra-axial fluid collection. Gray matter and white matter appear normal. Cerebral volume is normal for age. Brainstem and posterior fossa are unremarkable. The CSF spaces appear normal. The bony structures are  intact. The visible portions of the paranasal sinuses are clear. IMPRESSION: Normal brain Electronically Signed   By: Andreas Newport M.D.   On: 04/16/2015 23:05   US Ob Comp Less 14 Wks  05/02/2015  CLINICAL DATA:  24 year old pregnant female with pelvic pain and vomiting for 1 day. Uncertain dates. EXAM: LIMITED OBSTETRIC ULTRASOUND FINDINGS: Number of Fetuses: 1 Heart Rate:  150 bpm Movement: Yes Presentation: Transverse head maternal right Placental Location: Posterior Previa: No Amniotic Fluid (Subjective):  Within normal limits. BPD:  2.01cm 13w  1d MATERNAL FINDINGS: Cervix:  Appears closed. Uterus/Adnexae: Ovaries not visualized. No subchorionic hemorrhage, free fluid or adnexal mass. IMPRESSION: Single living intrauterine gestation with estimated gestational age of [redacted] weeks 1 day by this ultrasound. No acute abnormalities identified. This exam is performed on an emergent basis and does not comprehensively evaluate fetal size, dating, or anatomy; follow-up complete OB US should be considered if further fetal assessment is warranted. Electronically Signed   By: Margarette Canada M.D.   On: 05/02/2015 15:06        Scheduled Meds: . enoxaparin (LOVENOX) injection  40 mg Subcutaneous Q24H  . folic acid  1 mg Oral Daily  . HYDROmorphone   Intravenous 6 times per day  .  HYDROmorphone (DILAUDID) injection  2 mg Intravenous Q2H   Followed by  . [START ON 05/05/2015]  HYDROmorphone (DILAUDID) injection  2 mg Intravenous Q2H  . methadone  5 mg Oral QHS  . potassium chloride  40 mEq Oral BID  . senna-docusate  1 tablet Oral BID   Continuous Infusions: . dextrose 5 % and 0.45% NaCl 50 mL/hr at 05/04/15 1052    Time spent 30 minutes.

## 2015-05-05 ENCOUNTER — Ambulatory Visit: Payer: Medicare Other | Admitting: Internal Medicine

## 2015-05-05 DIAGNOSIS — K5901 Slow transit constipation: Secondary | ICD-10-CM

## 2015-05-05 MED ORDER — FLEET ENEMA 7-19 GM/118ML RE ENEM
1.0000 | ENEMA | Freq: Every day | RECTAL | Status: DC | PRN
  Administered 2015-05-05: 1 via RECTAL
  Filled 2015-05-05: qty 1

## 2015-05-05 MED ORDER — BISACODYL 10 MG RE SUPP
10.0000 mg | Freq: Once | RECTAL | Status: AC
  Administered 2015-05-05: 10 mg via RECTAL
  Filled 2015-05-05: qty 1

## 2015-05-05 NOTE — Care Management Note (Signed)
Case Management Note  Patient Details  Name: DAILANY MOCCIA MRN: [id number] Date of Birth: 1990-11-04  Subjective/Objective:        24 yo admitted with [facility]            Action/Plan: From home alone  Expected Discharge Date:                  Expected Discharge Plan:  Home/Self Care  In-House Referral:     Discharge planning Services  CM Consult  Post Acute Care Choice:    Choice offered to:     DME Arranged:    DME Agency:     HH Arranged:    [place] Agency:     Status of Service:  In process, will continue to follow  Medicare Important Message Given:    Date Medicare IM Given:    Medicare IM give by:    Date Additional Medicare IM Given:    Additional Medicare Important Message give by:     If discussed at [place] of Stay Meetings, dates discussed:    Additional Comments:  Lynnell Catalan, RN 05/05/2015, 3:18 PM

## 2015-05-05 NOTE — Progress Notes (Signed)
SICKLE CELL SERVICE PROGRESS NOTE  [name] [id number] DOB: 1991/01/31 DOA: 05/02/2015 PCP: Angelica Chessman, MD  Assessment/Plan: Active Problems:   Sickle cell crisis ([name])   Metabolic acidemia, unspecified  1. Hb SS with crisis:  -pain in RUE without swelling. - bony infarct causing severe pain.  -best managed concurrently with NSAID's however due to her pregnancy she cannot receive NSAID's.  2. Leukocytosis: Improving- no labs today, will check in AM 3. Anemia of Chronic Disease: check labs in AM 4. 1st Trimester Intrauterine Pregnancy: Pt has not yet initiated care with obstetrics. Continue Folic Acid 2 mg daily- nurse to check fetal heart tones in AM. 5. Chronic pain: Continue Methadone/PCA 6. Constipation- enema as patient has a lot of pressure and suppository did not help   Code Status: Full Code Family Communication: N/A Disposition Plan: Not yet ready for discharge  [name]   If 7PM-7AM, please contact night-coverage.  05/05/2015, 5:21 PM  LOS: 3 days   Interim History: Appetite still limited C/o pressure in bottom-- unable to have BM-- given suppository x 1 but not effective   Consultants:  None  Procedures:  None  Antibiotics:  None    Objective:  Weight change: 4 lb 14.4 oz (2.223 kg)     Physical Exam Awake, on toilet-- appears uncomfortable rrr Clear +BS No edema  Data Reviewed: Basic Metabolic Panel:  Recent Labs Lab 05/02/15 1055 05/03/15 0424 05/04/15 1020  NA 140 138 136  K 3.5 3.4* 3.4*  CL 109 108 104  CO2 19* 22 23  GLUCOSE 116* 110* 97  BUN 7 <5* <5*  CREATININE 0.50 0.49 0.37*  CALCIUM 9.2 8.5* 8.5*  MG  --   --  1.9   Liver Function Tests:  Recent Labs Lab 05/02/15 1055  AST 36  ALT 24  ALKPHOS 49  BILITOT 2.1*  PROT 8.2*  ALBUMIN 4.7    Recent Labs Lab 05/02/15 1055  LIPASE 47   CBC:  Recent Labs Lab 05/02/15 1055 05/03/15 0424  WBC 28.1* 19.9*  NEUTROABS 24.1* 17.8*   HGB 10.5* 7.7*  HCT 30.1* 22.4*  MCV 89.3 88.5  PLT 549* 441*    Studies: Ct Head Wo Contrast  04/16/2015  CLINICAL DATA:  [place] tonight with loss of consciousness. Amnestic for the event. Now with supraorbital pain on the left. EXAM: CT HEAD WITHOUT CONTRAST TECHNIQUE: Contiguous axial images were obtained from the base of the skull through the vertex without intravenous contrast. COMPARISON:  None. FINDINGS: There is no intracranial hemorrhage, mass or evidence of acute infarction. There is no extra-axial fluid collection. Gray matter and white matter appear normal. Cerebral volume is normal for age. Brainstem and posterior fossa are unremarkable. The CSF spaces appear normal. The bony structures are intact. The visible portions of the paranasal sinuses are clear. IMPRESSION: Normal brain Electronically Signed   By: Andreas Newport M.D.   On: 04/16/2015 23:05   US Ob Comp Less 14 Wks  05/02/2015  CLINICAL DATA:  24 year old pregnant female with pelvic pain and vomiting for 1 day. Uncertain dates. EXAM: LIMITED OBSTETRIC ULTRASOUND FINDINGS: Number of Fetuses: 1 Heart Rate:  150 bpm Movement: Yes Presentation: Transverse head maternal right Placental Location: Posterior Previa: No Amniotic Fluid (Subjective):  Within normal limits. BPD:  2.01cm 13w  1d MATERNAL FINDINGS: Cervix:  Appears closed. Uterus/Adnexae: Ovaries not visualized. No subchorionic hemorrhage, free fluid or adnexal mass. IMPRESSION: Single living intrauterine gestation with estimated gestational age of [redacted] weeks 1  day by this ultrasound. No acute abnormalities identified. This exam is performed on an emergent basis and does not comprehensively evaluate fetal size, dating, or anatomy; follow-up complete OB US should be considered if further fetal assessment is warranted. Electronically Signed   By: Margarette Canada M.D.   On: 05/02/2015 15:06        Scheduled Meds: . enoxaparin (LOVENOX) injection  40 mg Subcutaneous Q24H  .  folic acid  1 mg Oral Daily  . HYDROmorphone   Intravenous 6 times per day  .  HYDROmorphone (DILAUDID) injection  2 mg Intravenous Q2H  . methadone  5 mg Oral QHS  . potassium chloride  40 mEq Oral BID  . senna-docusate  1 tablet Oral BID   Continuous Infusions: . dextrose 5 % and 0.45% NaCl 50 mL/hr at 05/05/15 1154    Time spent 30 minutes.

## 2015-05-06 ENCOUNTER — Other Ambulatory Visit (HOSPITAL_COMMUNITY): Payer: Self-pay | Admitting: Family Medicine

## 2015-05-06 DIAGNOSIS — M549 Dorsalgia, unspecified: Secondary | ICD-10-CM

## 2015-05-06 DIAGNOSIS — G8929 Other chronic pain: Secondary | ICD-10-CM

## 2015-05-06 DIAGNOSIS — D571 Sickle-cell disease without crisis: Secondary | ICD-10-CM

## 2015-05-06 LAB — CBC
HEMATOCRIT: 20.9 % — AB (ref 36.0–46.0)
HEMOGLOBIN: 7.1 g/dL — AB (ref 12.0–15.0)
MCH: 30.3 pg (ref 26.0–34.0)
MCHC: 34 g/dL (ref 30.0–36.0)
MCV: 89.3 fL (ref 78.0–100.0)
Platelets: 424 10*3/uL — ABNORMAL HIGH (ref 150–400)
RBC: 2.34 MIL/uL — ABNORMAL LOW (ref 3.87–5.11)
RDW: 16 % — AB (ref 11.5–15.5)
WBC: 14.9 10*3/uL — ABNORMAL HIGH (ref 4.0–10.5)

## 2015-05-06 LAB — BASIC METABOLIC PANEL
Anion gap: 8 (ref 5–15)
CALCIUM: 8.6 mg/dL — AB (ref 8.9–10.3)
CHLORIDE: 107 mmol/L (ref 101–111)
CO2: 24 mmol/L (ref 22–32)
CREATININE: 0.47 mg/dL (ref 0.44–1.00)
GFR calc Af Amer: >60 mL/min (ref >60)
GFR calc non Af Amer: >60 mL/min (ref >60)
GLUCOSE: 96 mg/dL (ref 65–99)
Potassium: 3.6 mmol/L (ref 3.5–5.1)
Sodium: 139 mmol/L (ref 135–145)

## 2015-05-06 MED ORDER — OXYCODONE HCL 15 MG PO TABS: 15.0000 mg | ORAL_TABLET | ORAL | Status: DC | PRN

## 2015-05-06 MED ORDER — HYDROMORPHONE HCL 2 MG/ML IJ SOLN
2.0000 mg | INTRAMUSCULAR | Status: AC | PRN
  Administered 2015-05-06 – 2015-05-07 (×3): 2 mg via INTRAVENOUS
  Filled 2015-05-06 (×2): qty 1

## 2015-05-06 MED ORDER — HYDROMORPHONE HCL 2 MG/ML IJ SOLN
2.0000 mg | Freq: Once | INTRAMUSCULAR | Status: AC
  Administered 2015-05-06: 2 mg via INTRAVENOUS

## 2015-05-06 MED ORDER — HYDROMORPHONE HCL 2 MG/ML IJ SOLN: 2.0000 mg | INTRAMUSCULAR | Status: DC

## 2015-05-06 NOTE — Progress Notes (Signed)
Patient ID: [name], female   DOB: 18-Oct-1990, 24 y.o.   MRN: [id number]  [facility] PROGRESS NOTE  LILLIAS STEEB [id number] DOB: [birth year] DOA: 05/02/2015 PCP: Angelica Chessman, MD   Brief narrative:    Pt is 24 yo female who is in 1st trimester of an IUP, presented to [facility] ED for evaluation of persistent emesis that started since beginning of this pregnancy resulting in poor oral intake. Pt also reported strong diffuse pain mostly in legs and back area.   Assessment/Plan:    1. Hb SS with crisis: - bony infarct causing severe pain, pt asking for dilaudid boluses throughout the day, gave additional dose of dilaudid 2 mg IV  - due to pregnancy pt cannot receive NSAID's 2. Leukocytosis:  - likely reactive, no clear evidence of UTI based on UA - CBC In AM 3. Anemia of Chronic SS Disease:  - CBC in AM 4. 1st Trimester Intrauterine Pregnancy:  - Pt has not yet initiated care with obstetrics - Continue Folic Acid 2 mg daily. 5. Chronic pain:  - Continue Methadone/PCA 6. Constipation - had BM this am - will monitor   DVT prophylaxis - Lovenox SQ  Code Status: Full.  Family Communication:  plan of care discussed with the patient Disposition Plan: Home by 12/23   IV access:  Peripheral IV  Procedures and diagnostic studies:    Ct Head Wo Contrast 04/16/2015 Normal brain   US Ob Comp Less 14 Wks 05/02/2015  Single living intrauterine gestation with estimated gestational age of [redacted] weeks 1 day by this ultrasound. No acute abnormalities identified. This exam is performed on an emergent basis and does not comprehensively evaluate fetal size, dating, or anatomy; follow-up complete OB US should be considered if further fetal assessment is warranted. [place] US Guide Vasc Access Right 04/16/2015  ISuccessful ultrasound and fluoroscopic guided placement of a right brachial vein approach, 34 cm, 5 [place], dual lumen power injectable PICC with tip at the superior  caval-atrial junction. The PICC line is ready for immediate use.  Medical Consultants:  None  Other Consultants:  None  IAnti-Infectives:   None   Faye Ramsay, MD  [facility] Pager [phone]  If 7PM-7AM, please contact night-coverage [email or web address] Password [facility] 05/06/2015, 5:54 PM   LOS: 4 days   HPI/Subjective: No events overnight.   Objective: Filed Vitals:   05/06/15 1200 05/06/15 1300 05/06/15 1624 05/06/15 1719  BP:  119/69  103/57  Pulse:  89  84  Temp:  98.7 F (37.1 C)  98.9 F (37.2 C)  TempSrc:  Oral  Oral  Resp: 19 16 12 12   Height:      Weight:      SpO2: 99% 99% 100% 99%    Intake/Output Summary (Last 24 hours) at 05/06/15 1754 Last data filed at 05/06/15 1720  Gross per 24 hour  Intake    480 ml  Output   2001 ml  Net  -1521 ml    Exam:   General:  Pt is alert, follows commands appropriately, not in acute distress  Cardiovascular: Regular rate and rhythm, S1/S2, no murmurs, no rubs, no gallops  Respiratory: Clear to auscultation bilaterally, no wheezing, no crackles, no rhonchi  Abdomen: Soft, non tender, non distended, bowel sounds present, no guarding  Extremities: No edema, pulses DP and PT palpable bilaterally  Neuro: Grossly nonfocal  Data Reviewed: Basic Metabolic Panel:  Recent Labs Lab 05/02/15 1055 05/03/15 0424 05/04/15 1020 05/06/15 0345  NA 140  138 136 139  K 3.5 3.4* 3.4* 3.6  CL 109 108 104 107  CO2 19* 22 23 24   GLUCOSE 116* 110* 97 96  BUN 7 <5* <5* <5*  CREATININE 0.50 0.49 0.37* 0.47  CALCIUM 9.2 8.5* 8.5* 8.6*  MG  --   --  1.9  --    Liver Function Tests:  Recent Labs Lab 05/02/15 1055  AST 36  ALT 24  ALKPHOS 49  BILITOT 2.1*  PROT 8.2*  ALBUMIN 4.7    Recent Labs Lab 05/02/15 1055  LIPASE 47   No results for input(s): AMMONIA in the last 168 hours. CBC:  Recent Labs Lab 05/02/15 1055 05/03/15 0424 05/06/15 0345  WBC 28.1* 19.9* 14.9*  NEUTROABS 24.1* 17.8*  --   HGB 10.5*  7.7* 7.1*  HCT 30.1* 22.4* 20.9*  MCV 89.3 88.5 89.3  PLT 549* 441* 424*   Cardiac Enzymes: No results for input(s): CKTOTAL, CKMB, CKMBINDEX, TROPONINI in the last 168 hours. BNP: Invalid input(s): POCBNP CBG: No results for input(s): GLUCAP in the last 168 hours.  No results found for this or any previous visit (from the past 240 hour(s)).   Scheduled Meds: . enoxaparin (LOVENOX) injection  40 mg Subcutaneous Q24H  . folic acid  1 mg Oral Daily  . HYDROmorphone   Intravenous 6 times per day  . methadone  5 mg Oral QHS  . potassium chloride  40 mEq Oral BID  . senna-docusate  1 tablet Oral BID   Continuous Infusions: . dextrose 5 % and 0.45% NaCl 50 mL/hr at 05/06/15 1031

## 2015-05-06 NOTE — Care Management Important Message (Signed)
Important Message  Patient Details  Name: SYLVETTE MORACE MRN: [id number] Date of Birth: 07-15-1990   Medicare Important Message Given:  Yes    Camillo Flaming 05/06/2015, 11:00 AMImportant Message  Patient Details  Name: SANTERIA BUFFO MRN: [id number] Date of Birth: 16-Feb-1991   Medicare Important Message Given:  Yes    Camillo Flaming 05/06/2015, 11:00 AM

## 2015-05-07 DIAGNOSIS — D72829 Elevated white blood cell count, unspecified: Secondary | ICD-10-CM

## 2015-05-07 LAB — BASIC METABOLIC PANEL
Anion gap: 7 (ref 5–15)
CALCIUM: 8.5 mg/dL — AB (ref 8.9–10.3)
CO2: 23 mmol/L (ref 22–32)
Chloride: 105 mmol/L (ref 101–111)
Creatinine, Ser: 0.47 mg/dL (ref 0.44–1.00)
GFR calc Af Amer: >60 mL/min (ref >60)
GLUCOSE: 90 mg/dL (ref 65–99)
Potassium: 3.7 mmol/L (ref 3.5–5.1)
Sodium: 135 mmol/L (ref 135–145)

## 2015-05-07 LAB — DIFFERENTIAL
BASOS PCT: 0 %
Basophils Absolute: 0 10*3/uL (ref 0.0–0.1)
EOS PCT: 3 %
Eosinophils Absolute: 0.4 10*3/uL (ref 0.0–0.7)
LYMPHS PCT: 26 %
Lymphs Abs: 4 10*3/uL (ref 0.7–4.0)
MONO ABS: 1.7 10*3/uL — AB (ref 0.1–1.0)
MONOS PCT: 11 %
Neutro Abs: 9.5 10*3/uL — ABNORMAL HIGH (ref 1.7–7.7)
Neutrophils Relative %: 60 %

## 2015-05-07 LAB — CBC
HCT: 21.6 % — ABNORMAL LOW (ref 36.0–46.0)
Hemoglobin: 7.4 g/dL — ABNORMAL LOW (ref 12.0–15.0)
MCH: 30.5 pg (ref 26.0–34.0)
MCHC: 34.3 g/dL (ref 30.0–36.0)
MCV: 88.9 fL (ref 78.0–100.0)
PLATELETS: 427 10*3/uL — AB (ref 150–400)
RBC: 2.43 MIL/uL — ABNORMAL LOW (ref 3.87–5.11)
RDW: 17 % — AB (ref 11.5–15.5)
WBC: 15.6 10*3/uL — ABNORMAL HIGH (ref 4.0–10.5)

## 2015-05-07 MED ORDER — OXYCODONE HCL 5 MG PO TABS
15.0000 mg | ORAL_TABLET | ORAL | Status: DC
  Administered 2015-05-07 – 2015-05-08 (×6): 15 mg via ORAL
  Filled 2015-05-07 (×6): qty 3

## 2015-05-07 NOTE — Progress Notes (Signed)
SICKLE CELL SERVICE PROGRESS NOTE  [name] [id number] DOB: 12-Nov-1990 DOA: 05/02/2015 PCP: Angelica Chessman, MD  Assessment/Plan: Active Problems:   Sickle cell crisis ([name])   Metabolic acidemia, unspecified  1. Hb SS with crisis: Pain in RUE has improved to level of 5-6/10. I suspect that she has sustained a bony infarct causing severe pain. This is best managed concurrently with NSAID's however due to her pregnancy she cannot receive NSAID's. Will discontinue clinician assisted doses of Dilaudid. Schedule Oxycodone and continue PCA for PRN use. Anticipate discharge home tomorrow. 2. Leukocytosis: Improving but still mildly elevated. Her chest wall pain has resolved and she has had no hypoxia. Suspect leukocytosis is a result of inflammation and gastritis. 3. Anemia of Chronic Disease: Hb at baseline.  4. 1st Trimester Intrauterine Pregnancy: Pt has not yet initiated care with obstetrics. Continue Folic Acid 2 mg daily. 5. Chronic pain: Continue Methadone.   Code Status: Full Code Family Communication: N/A Disposition Plan: Anticipate discharge home tomorrow.  Roan Miklos A.  Pager [phone]. If 7PM-7AM, please contact night-coverage.  05/07/2015, 12:25 PM  LOS: 5 days   Interim History: Pt is tolerating diet. Her pain is essentially localized to her RUE at an intensity of 6-7/10. The  pain in back and legs at a level of 4/10.   Consultants:  None  Procedures:  None  Antibiotics:  None    Objective:  Weight change: 4 lb 14.4 oz (2.223 kg)  Intake/Output Summary (Last 24 hours) at 05/04/15 1208 Last data filed at 05/04/15 [id number]  Gross per 24 hour  Intake 3868.53 ml  Output      0 ml  Net 3868.53 ml    General: Alert, awake, oriented x3, in no acute distress. Sitting up and eating lunch. Vital Signs: BP 113/55, HR 83, T 98.7 F (37.1 C), temperature source Oral, RR 13, height 5\' 2"  (1.575 m), weight 174 lb 12.8 oz (79.289 kg), last menstrual  period 02/09/2015, SpO2 100 %, not currently breastfeeding. HEENT: [place]/AT PEERL, EOMI, anicteric Neck: Trachea midline,  no masses, no thyromegal,y no JVD, no carotid bruit OROPHARYNX:  Moist, No exudate/ erythema/lesions.  Heart: Regular rate and rhythm, without murmurs, rubs, gallops, PMI non-displaced, no heaves or thrills on palpation.  Lungs: Clear to auscultation, no wheezing or rhonchi noted. No increased vocal fremitus resonant to percussion  Abdomen: Soft, nontender, nondistended, positive bowel sounds, no masses no hepatosplenomegaly noted.  Neuro: No focal neurological deficits noted cranial nerves II through XII grossly intact.  Strength at functional baseline in bilateral upper and lower extremities. Musculoskeletal: No warm swelling or erythema around joints, no spinal tenderness noted.    Data Reviewed: Basic Metabolic Panel:  Recent Labs Lab 05/02/15 1055 05/03/15 0424 05/04/15 1020  NA 140 138 136  K 3.5 3.4* 3.4*  CL 109 108 104  CO2 19* 22 23  GLUCOSE 116* 110* 97  BUN 7 <5* <5*  CREATININE 0.50 0.49 0.37*  CALCIUM 9.2 8.5* 8.5*  MG  --   --  1.9   Liver Function Tests:  Recent Labs Lab 05/02/15 1055  AST 36  ALT 24  ALKPHOS 49  BILITOT 2.1*  PROT 8.2*  ALBUMIN 4.7    Recent Labs Lab 05/02/15 1055  LIPASE 47   CBC:  Recent Labs Lab 05/02/15 1055 05/03/15 0424  WBC 28.1* 19.9*  NEUTROABS 24.1* 17.8*  HGB 10.5* 7.7*  HCT 30.1* 22.4*  MCV 89.3 88.5  PLT 549* 441*    Studies: Ct Head Wo  Contrast  04/16/2015  CLINICAL DATA:  [place] tonight with loss of consciousness. Amnestic for the event. Now with supraorbital pain on the left. EXAM: CT HEAD WITHOUT CONTRAST TECHNIQUE: Contiguous axial images were obtained from the base of the skull through the vertex without intravenous contrast. COMPARISON:  None. FINDINGS: There is no intracranial hemorrhage, mass or evidence of acute infarction. There is no extra-axial fluid collection. Gray matter and  white matter appear normal. Cerebral volume is normal for age. Brainstem and posterior fossa are unremarkable. The CSF spaces appear normal. The bony structures are intact. The visible portions of the paranasal sinuses are clear. IMPRESSION: Normal brain Electronically Signed   By: Andreas Newport M.D.   On: 04/16/2015 23:05   US Ob Comp Less 14 Wks  05/02/2015  CLINICAL DATA:  24 year old pregnant female with pelvic pain and vomiting for 1 day. Uncertain dates. EXAM: LIMITED OBSTETRIC ULTRASOUND FINDINGS: Number of Fetuses: 1 Heart Rate:  150 bpm Movement: Yes Presentation: Transverse head maternal right Placental Location: Posterior Previa: No Amniotic Fluid (Subjective):  Within normal limits. BPD:  2.01cm 13w  1d MATERNAL FINDINGS: Cervix:  Appears closed. Uterus/Adnexae: Ovaries not visualized. No subchorionic hemorrhage, free fluid or adnexal mass. IMPRESSION: Single living intrauterine gestation with estimated gestational age of [redacted] weeks 1 day by this ultrasound. No acute abnormalities identified. This exam is performed on an emergent basis and does not comprehensively evaluate fetal size, dating, or anatomy; follow-up complete OB US should be considered if further fetal assessment is warranted. Electronically Signed   By: Margarette Canada M.D.   On: 05/02/2015 15:06        Scheduled Meds: . enoxaparin (LOVENOX) injection  40 mg Subcutaneous Q24H  . folic acid  1 mg Oral Daily  . HYDROmorphone   Intravenous 6 times per day  . methadone  5 mg Oral QHS  . oxyCODONE  15 mg Oral Q4H  . potassium chloride  40 mEq Oral BID  . senna-docusate  1 tablet Oral BID   Continuous Infusions: . dextrose 5 % and 0.45% NaCl 50 mL/hr at 05/07/15 0217    Time spent 25 minutes.

## 2015-05-08 DIAGNOSIS — R112 Nausea with vomiting, unspecified: Secondary | ICD-10-CM

## 2015-05-08 DIAGNOSIS — E876 Hypokalemia: Secondary | ICD-10-CM

## 2015-05-08 NOTE — Discharge Summary (Signed)
DEJAH DROESSLER MRN: [id number] DOB/AGE: 23/16/1992 24 y.o.  Admit date: 05/02/2015 Discharge date: 05/08/2015  Primary Care Physician:  Angelica Chessman, MD   Discharge Diagnoses:   Patient Active Problem List   Diagnosis Date Noted  . Mood swings ([name]) 11/08/2011    Priority: High  . Depression 01/06/2011    Priority: High  . Sickle cell disease ([place]) 01/08/2009    Priority: High  . Anemia of chronic disease 10/05/2012    Priority: Medium  . Avascular necrosis of humeral head ([place]) 08/28/2012    Priority: Medium  . Metabolic acidemia, unspecified 05/02/2015  . Sickle cell anemia with pain ([place]) 04/14/2015  . Sickle cell anemia with crisis ([place]) 04/14/2015  . Sickle-cell disease with pain ([place]) 03/30/2015  . Leukocytosis 03/22/2015  . Pregnancy at early stage 03/22/2015  . Labor abnormal 12/05/2014  . Sickle cell pain crisis ([place]) 11/26/2014  . Hypokalemia 10/29/2014  . Line sepsis ([place]) 10/27/2014  . Sickle cell crisis ([place]) 10/23/2014  . 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 Supervision of high-risk pregnancy 06/11/2014  . Chronic pain 02/26/2013  . Migraines 11/08/2011  . Overweight(278.02) 05/24/2011  . GERD (gastroesophageal reflux disease) 02/17/2011  . Back pain 09/17/2010  . Trichotillomania 01/08/2009    DISCHARGE MEDICATION:   Medication List    TAKE these medications        aspirin-acetaminophen-caffeine 250-250-65 MG tablet  Commonly known as:  EXCEDRIN MIGRAINE  Take 1 tablet by mouth daily as needed for headache.     folic acid 1 MG tablet  Commonly known as:  FOLVITE  Take 2 tablets (2 mg total) by mouth daily.     hydroxyurea 500 MG capsule  Commonly known as:  HYDREA  Take 500 mg by mouth daily. May take with food to minimize GI side effects.     ibuprofen 800 MG tablet  Commonly known as:  ADVIL,MOTRIN  Take 800 mg by mouth every 8 (eight) hours as needed for moderate pain.     methadone 5 MG tablet  Commonly known as:   DOLOPHINE  Take 1 tablet (5 mg total) by mouth at bedtime.     oxyCODONE 15 MG immediate release tablet  Commonly known as:  ROXICODONE  Take 1 tablet (15 mg total) by mouth every 4 (four) hours as needed for pain.          Consults:     SIGNIFICANT DIAGNOSTIC STUDIES:  Ct Head Wo Contrast  04/16/2015  CLINICAL DATA:  [place] tonight with loss of consciousness. Amnestic for the event. Now with supraorbital pain on the left. EXAM: CT HEAD WITHOUT CONTRAST TECHNIQUE: Contiguous axial images were obtained from the base of the skull through the vertex without intravenous contrast. COMPARISON:  None. FINDINGS: There is no intracranial hemorrhage, mass or evidence of acute infarction. There is no extra-axial fluid collection. Gray matter and white matter appear normal. Cerebral volume is normal for age. Brainstem and posterior fossa are unremarkable. The CSF spaces appear normal. The bony structures are intact. The visible portions of the paranasal sinuses are clear. IMPRESSION: Normal brain Electronically Signed   By: Andreas Newport M.D.   On: 04/16/2015 23:05   US Ob Comp Less 14 Wks  05/02/2015  CLINICAL DATA:  24 year old pregnant female with pelvic pain and vomiting for 1 day. Uncertain dates. EXAM: LIMITED OBSTETRIC ULTRASOUND FINDINGS: Number of Fetuses: 1 Heart Rate:  150 bpm Movement: Yes Presentation: Transverse head maternal right Placental Location: Posterior Previa: No Amniotic Fluid (  Subjective):  Within normal limits. BPD:  2.01cm 13w  1d MATERNAL FINDINGS: Cervix:  Appears closed. Uterus/Adnexae: Ovaries not visualized. No subchorionic hemorrhage, free fluid or adnexal mass. IMPRESSION: Single living intrauterine gestation with estimated gestational age of [redacted] weeks 1 day by this ultrasound. No acute abnormalities identified. This exam is performed on an emergent basis and does not comprehensively evaluate fetal size, dating, or anatomy; follow-up complete OB US should be considered if  further fetal assessment is warranted. Electronically Signed   By: Margarette Canada M.D.   On: 05/02/2015 15:06   Ir Fluoro Guide Cv Line Right  04/16/2015  INDICATION: Poor venous access, request for peripherally inserted central catheter placement. EXAM: ULTRASOUND AND FLUOROSCOPIC GUIDED PICC LINE INSERTION MEDICATIONS: None. CONTRAST:  None FLUOROSCOPY TIME:  1 minute 30 seconds. COMPLICATIONS: None immediate TECHNIQUE: The procedure, risks, benefits, and alternatives were explained to the patient and informed written consent was obtained. A timeout was performed prior to the initiation of the procedure. The right upper extremity was prepped with chlorhexidine in a sterile fashion, and a sterile drape was applied covering the operative field. Maximum barrier sterile technique with sterile gowns and gloves were used for the procedure. A timeout was performed prior to the initiation of the procedure. Local anesthesia was provided with 1% lidocaine. Under direct ultrasound guidance, the right brachial vein was accessed with a micropuncture kit after the overlying soft tissues were anesthetized with 1% lidocaine. An ultrasound image was saved for documentation purposes. A guidewire was advanced to the level of the superior caval-atrial junction for measurement purposes and the PICC line was cut to length. A peel-away sheath was placed and a 34 cm, 5 [place], dual lumen was inserted to level of the superior caval-atrial junction. A post procedure spot fluoroscopic was obtained. The catheter easily aspirated and flushed and was sutured in place. A dressing was placed. The patient tolerated the procedure well without immediate post procedural complication. FINDINGS: After catheter placement, the tip lies within the superior cavoatrial junction. The catheter aspirates and flushes normally and is ready for immediate use. IMPRESSION: Successful ultrasound and fluoroscopic guided placement of a right brachial vein approach,  34 cm, 5 [place], dual lumen power injectable PICC with tip at the superior caval-atrial junction. The PICC line is ready for immediate use. Read By:  Tsosie Billing PA-C Electronically Signed   By: Aletta Edouard M.D.   On: 04/16/2015 09:41   Ir US Guide Vasc Access Right  04/16/2015  INDICATION: Poor venous access, request for peripherally inserted central catheter placement. EXAM: ULTRASOUND AND FLUOROSCOPIC GUIDED PICC LINE INSERTION MEDICATIONS: None. CONTRAST:  None FLUOROSCOPY TIME:  1 minute 30 seconds. COMPLICATIONS: None immediate TECHNIQUE: The procedure, risks, benefits, and alternatives were explained to the patient and informed written consent was obtained. A timeout was performed prior to the initiation of the procedure. The right upper extremity was prepped with chlorhexidine in a sterile fashion, and a sterile drape was applied covering the operative field. Maximum barrier sterile technique with sterile gowns and gloves were used for the procedure. A timeout was performed prior to the initiation of the procedure. Local anesthesia was provided with 1% lidocaine. Under direct ultrasound guidance, the right brachial vein was accessed with a micropuncture kit after the overlying soft tissues were anesthetized with 1% lidocaine. An ultrasound image was saved for documentation purposes. A guidewire was advanced to the level of the superior caval-atrial junction for measurement purposes and the PICC line was cut to length. A  peel-away sheath was placed and a 34 cm, 5 [place], dual lumen was inserted to level of the superior caval-atrial junction. A post procedure spot fluoroscopic was obtained. The catheter easily aspirated and flushed and was sutured in place. A dressing was placed. The patient tolerated the procedure well without immediate post procedural complication. FINDINGS: After catheter placement, the tip lies within the superior cavoatrial junction. The catheter aspirates and flushes normally  and is ready for immediate use. IMPRESSION: Successful ultrasound and fluoroscopic guided placement of a right brachial vein approach, 34 cm, 5 [place], dual lumen power injectable PICC with tip at the superior caval-atrial junction. The PICC line is ready for immediate use. Read By:  Tsosie Billing PA-C Electronically Signed   By: Aletta Edouard M.D.   On: 04/16/2015 09:41     No results found for this or any previous visit (from the past 240 hour(s)).  BRIEF ADMITTING H & P: Nabiha is an opiate tolerant patient with Hb SS an dwhi is in her 1st trimester of an IUP (Approximately 13 weeks). Pt states that since she has been pregnant, she has been having emesis almost every morning. However she developed a crisis starting yesterday and has had multiple episodes of emesis too numerous to count. The emesis was intially of food but is now greenish in color. She denies any fevers but does c/o pain in the suprapubic region. She denies dysuria, fevers, chills, Diarrhea. Patient denies any sick contacts.   With regard to her Sickle Cell Crisis, pain is localized to legs and back at an intensity of 9/10. Pain is throbbing and occasionally sharp but non-radiating. She was unable to take her pain medications due to emesis.   In the ED she received 3 doses of Dilaudid and several doses of Phenergan and Zofran. However patient is still having emesis and pain is uncontrolled. She received a2 liter of 0.9 NS in the ED and is presently on D5.45 IVF. A urinalysis has been ordered but patietn has not been able to produce a sample.     Hospital Course:  Present on Admission:  . Sickle cell crisis ([place]): Pt's pain was essentially localized to    Her RUE but there was no swelling. I suspect that she had an infarct to the bone. She was treated with  With Dilaudid via PCA and IVF. She was unable to receive NSAID's due to being pregnant. As her pain resolved she was transitioned to oral analgesics. She is continued on  her pre-hospital regimen. She is to follow up with her PMD as needed.   . Metabolic acidemia, unspecified: Felt it was secondary to fluid loss from vomiting. Resolved with hydration.  . Nausea and Vomiting: Pt likely had a viral gastritis which began several days prior to hospitalization. She was treated supportively and diet advanced as tolerated. Patient is tolerating a regular diet at time of discharge.  . Hypokalemia: Potassium was replaced orally and patient has been able to maintain with diet.  . 1st Trimester Pregnancy: Pt is pregnant and in her 1st trimester. She has not yet initiated Ob-Gyn care.    Disposition and Follow-up:  Pt is discharged home in stable condition and is to follow up with PMD as needed.      Discharge Instructions    Activity as tolerated - No restrictions    Complete by:  As directed      Diet general    Complete by:  As directed  DISCHARGE EXAM:  General: Alert, awake, oriented x3, in no apparent distress.  Vital Signs: BP116/63, HR 84, T 98 F (36.7 C), temperature source Oral, RR 16, height _0  (1.575 m), weight 174 lb 1.6 oz (78.971 kg), last menstrual period 02/09/2015, SpO2 98 %, not currently breastfeeding. HEENT: [place]/AT PEERL, EOMI, anicteric Neck: Trachea midline, no masses, no thyromegal,y no JVD, no carotid bruit OROPHARYNX: Moist, No exudate/ erythema/lesions.  Heart: Regular rate and rhythm, without murmurs, rubs, gallops or S3. PMI non-displaced. Exam reveals no decreased pulses. Pulmonary/Chest: Normal effort. Breath sounds normal. No. Apnea. Clear to auscultation,no stridor,  no wheezing and no rhonchi noted. No respiratory distress and no tenderness noted. Abdomen: Soft, nontender, nondistended, normal bowel sounds, no masses no hepatosplenomegaly noted. No fluid wave and no ascites. There is no guarding or rebound. Cannot palpate fetus. Neuro: Alert and oriented to person, place and time. Normal motor skills, Displays  no atrophy or tremors and exhibits normal muscle tone.  No focal neurological deficits noted cranial nerves II through XII grossly intact. No sensory deficit noted. Strength at baseline in bilateral upper and lower extremities. Gait normal. Musculoskeletal: No warm swelling or erythema around joints, no spinal tenderness noted. Psychiatric: Patient alert and oriented x3, good insight and cognition, good recent to remote recall. Mood, memory, affect and judgement normal      Recent Labs  05/06/15 0345 05/07/15 0355  NA 139 135  K 3.6 3.7  CL 107 105  CO2 24 23  GLUCOSE 96 90  BUN <5* <5*  CREATININE 0.47 0.47  CALCIUM 8.6* 8.5*   No results for input(s): AST, ALT, ALKPHOS, BILITOT, PROT, ALBUMIN in the last 72 hours. No results for input(s): LIPASE, AMYLASE in the last 72 hours.  Recent Labs  05/06/15 0345 05/07/15 0355  WBC 14.9* 15.6*  NEUTROABS  --  9.5*  HGB 7.1* 7.4*  HCT 20.9* 21.6*  MCV 89.3 88.9  PLT 424* 427*     Total time spent including face to face and decision making was greater than 30 minutes  Signed: Dalinda Heidt A. 05/08/2015, 12:18 PM

## 2015-05-08 NOTE — Progress Notes (Signed)
Discharge instructions reviewed with patient, verbalized understanding.  No scripts given, patient says she does not require any.  Patient transported via wheelchair to front of hospital to be taken home by family member.

## 2015-05-15 ENCOUNTER — Telehealth: Payer: Self-pay | Admitting: Internal Medicine

## 2015-05-15 NOTE — Telephone Encounter (Signed)
Refill request for oxycodone 15 mg. LOV 04/07/2015 with [facility]. Please advise. Thanks!

## 2015-05-16 NOTE — Telephone Encounter (Signed)
Received a request for opiate medications. [name] will need to schedule a follow up in office.   Dorena Dew, FNP

## 2015-05-17 NOTE — L&D Delivery Note (Signed)
Delivery Note At 5:23 PM a viable and healthy female was delivered via Vaginal, Spontaneous Delivery (Presentation: Middle Occiput Anterior).  APGAR: 8, 9; weight 6 lb 15.8 oz (3170 g).   Placenta status: Abnormal, Spontaneous Pathology.  Cord: 3 vessels with the following complications: None.  Cord pH: NA  Anesthesia: Epidural Episiotomy: None Lacerations: None Suture Repair: NA Est. Blood Loss (mL): 600  Mom to postpartum.  Baby to Couplet care / Skin to Skin. Dr. Ilda Basset will consult w/ Sickle Cell team RE: pain management CBC in am  Manya Silvas 10/31/2015, 6:43 PM

## 2015-05-19 ENCOUNTER — Telehealth: Payer: Self-pay | Admitting: Internal Medicine

## 2015-05-19 DIAGNOSIS — D571 Sickle-cell disease without crisis: Secondary | ICD-10-CM

## 2015-05-19 DIAGNOSIS — M549 Dorsalgia, unspecified: Secondary | ICD-10-CM

## 2015-05-19 DIAGNOSIS — G8929 Other chronic pain: Secondary | ICD-10-CM

## 2015-05-19 MED ORDER — OXYCODONE HCL 15 MG PO TABS: 15.0000 mg | ORAL_TABLET | ORAL | Status: DC | PRN

## 2015-05-19 NOTE — Telephone Encounter (Signed)
Reviewed [place] Substance Reporting system prior to prescribing opiate medications. No inconsistencies noted.   Meds ordered this encounter  Medications  . oxyCODONE (ROXICODONE) 15 MG immediate release tablet    Sig: Take 1 tablet (15 mg total) by mouth every 4 (four) hours as needed for pain.    Dispense:  90 tablet    Refill:  0    Rx not to be filled prior to 05/21/2015    Order Specific Question:  Supervising Provider    Answer:  Tresa Garter [id number]     Dorena Dew, FNP

## 2015-05-19 NOTE — Telephone Encounter (Signed)
Patient would like to pick up RX early because she has to go to court out of town tomorrow.

## 2015-05-26 ENCOUNTER — Telehealth: Payer: Self-pay | Admitting: Family Medicine

## 2015-05-26 ENCOUNTER — Ambulatory Visit: Payer: Medicare Other | Admitting: Internal Medicine

## 2015-05-26 NOTE — Telephone Encounter (Signed)
Refill request for Oxycodone 15mg . LOV 04/07/2015 Please advise. Thanks!

## 2015-05-29 ENCOUNTER — Encounter: Payer: Self-pay | Admitting: Family Medicine

## 2015-05-29 ENCOUNTER — Ambulatory Visit (INDEPENDENT_AMBULATORY_CARE_PROVIDER_SITE_OTHER): Payer: Medicare Other | Admitting: Family Medicine

## 2015-05-29 VITALS — BP 121/75 | HR 89 | Temp 99.2°F | Resp 14 | Ht 62.0 in | Wt 169.0 lb

## 2015-05-29 DIAGNOSIS — D571 Sickle-cell disease without crisis: Secondary | ICD-10-CM

## 2015-05-29 DIAGNOSIS — Z3490 Encounter for supervision of normal pregnancy, unspecified, unspecified trimester: Secondary | ICD-10-CM

## 2015-05-29 DIAGNOSIS — Z331 Pregnant state, incidental: Secondary | ICD-10-CM

## 2015-05-29 LAB — COMPLETE METABOLIC PANEL WITH GFR
ALBUMIN: 3.7 g/dL (ref 3.6–5.1)
ALK PHOS: 48 U/L (ref 33–115)
ALT: 20 U/L (ref 6–29)
AST: 19 U/L (ref 10–30)
BUN: 4 mg/dL — AB (ref 7–25)
CO2: 22 mmol/L (ref 20–31)
CREATININE: 0.43 mg/dL — AB (ref 0.50–1.10)
Calcium: 9.1 mg/dL (ref 8.6–10.2)
Chloride: 106 mmol/L (ref 98–110)
GFR, Est African American: >89 mL/min (ref >=60)
GFR, Est Non African American: >89 mL/min (ref >=60)
Glucose, Bld: 84 mg/dL (ref 65–99)
Potassium: 4.4 mmol/L (ref 3.5–5.3)
SODIUM: 137 mmol/L (ref 135–146)
TOTAL PROTEIN: 6.3 g/dL (ref 6.1–8.1)
Total Bilirubin: 2.1 mg/dL — ABNORMAL HIGH (ref 0.2–1.2)

## 2015-05-29 LAB — POCT URINALYSIS DIP (DEVICE)
BILIRUBIN URINE: NEGATIVE
Glucose, UA: NEGATIVE mg/dL
HGB URINE DIPSTICK: NEGATIVE
KETONES UR: NEGATIVE mg/dL
NITRITE: NEGATIVE
Protein, ur: NEGATIVE mg/dL
Specific Gravity, Urine: 1.015 (ref 1.005–1.030)
Urobilinogen, UA: 2 mg/dL — ABNORMAL HIGH (ref 0.0–1.0)
pH: 7 (ref 5.0–8.0)

## 2015-05-29 MED ORDER — OXYCODONE HCL 15 MG PO TABS
15.0000 mg | ORAL_TABLET | ORAL | Status: DC | PRN
Start: 2015-05-29 — End: 2015-06-16

## 2015-05-29 NOTE — Progress Notes (Signed)
Subjective:    Patient ID: [name], female    DOB: 05/24/1990, 25 y.o.   MRN: [id number]  HPI [name], a 25 year old female with a history of sickle cell anemia, HbSS presents for a 1 month follow up of sickle cell anemia. [name] states that she feels well and has minimal complaints. She maintains that current pain intensity is 3/10. She last had Oxycodone 15 mg this am with moderate relief. She states that pain is primarily in lower extremities. She says that she is taking all medications consistently. She restarted taking hydroxyurea as previously prescribed. [name] is currently [redacted] weeks pregnant. She states that she is not seeking prenatal care and is not interested.   [name] denies headache, chest pains, fever, shortness of breath, abdominal pain, dysuria, nausea, vomiting, or diarrhea.  Past Medical History  Diagnosis Date  . Miscarriage 03/22/2011    Pt reports 2 miscarriages.  . Depression 01/06/2011  . GERD (gastroesophageal reflux disease) 02/17/2011  . Trichotillomania     h/o  . Blood transfusion     "lots"  . Sickle cell anemia with crisis ([place])   . Exertional dyspnea     "sometimes"  . Migraines 11/08/11    "@ least twice/month"  . Chronic back pain     "very severe; have knot in my back; from tight muscle; take RX and exercise for it"  . Mood swings ([place]) 11/08/11    "I go back and forth; real bad"  . Sickle cell anemia ([name])     Review of Systems  Constitutional: Negative.  Negative for fever and fatigue.  HENT: Negative.   Eyes: Negative.  Negative for visual disturbance.  Respiratory: Negative.   Cardiovascular: Negative.   Gastrointestinal: Negative.   Endocrine: Negative.  Negative for polydipsia, polyphagia and polyuria.  Genitourinary: Negative.   Musculoskeletal: Positive for myalgias (lower extremity pain).  Skin: Negative.   Allergic/Immunologic: Negative.   Neurological: Negative.   Hematological: Negative.    Psychiatric/Behavioral: Negative.        Objective:   Physical Exam  Constitutional: She is oriented to person, place, and time. She appears well-developed and well-nourished.  HENT:  Head: Normocephalic and atraumatic.  Right Ear: External ear normal.  Left Ear: External ear normal.  Nose: Nose normal.  Mouth/Throat: Oropharynx is clear and moist.  Eyes: Conjunctivae and EOM are normal. Pupils are equal, round, and reactive to light.  Neck: Normal range of motion. Neck supple.  Cardiovascular: Normal rate, regular rhythm, normal heart sounds and intact distal pulses.   Pulmonary/Chest: Effort normal and breath sounds normal.  Abdominal: Soft. Bowel sounds are normal.  Musculoskeletal: Normal range of motion.       Right knee: She exhibits normal range of motion.       Left knee: She exhibits normal range of motion.  Neurological: She is alert and oriented to person, place, and time. She has normal reflexes.  Skin: Skin is warm and dry.  Psychiatric: She has a normal mood and affect. Her behavior is normal. Judgment and thought content normal.      BP 121/75 mmHg  Pulse 89  Temp(Src) 99.2 F (37.3 C) (Oral)  Resp 14  Ht 5\' 2"  (1.575 m)  Wt 169 lb (76.658 kg)  BMI 30.90 kg/m2  LMP 02/09/2015 Assessment & Plan:  1. Hb-SS disease without crisis ([place]) We discussed the need for good hydration, monitoring of hydration status, avoidance of heat, cold, stress, and infection  triggers. We discussed the risksof Hydrea, during pregnancy.  The patient was reminded of the need to seek medical attention of any symptoms of bleeding, anemia, or infection. Continue folic acid 1 mg daily to prevent aplastic bone marrow crises.   Pulmonary evaluation - Patient denies severe recurrent wheezes, shortness of breath with exercise, or persistent cough. If these symptoms develop, pulmonary function tests with spirometry will be ordered, and if abnormal, plan on referral to Pulmonology for further  evaluation.  Cardiac - Routine screening for pulmonary hypertension is not recommended.  Eye - High risk of proliferative retinopathy. Annual eye exam with retinal exam recommended to patient. She states that she will schedule any eye examination.   Immunization status - Vaccinations up to date.   Acute and chronic painful episodes - We agreed on current pain medication regiment. We discussed that pt is to receive her Schedule II prescriptions only from [place]. Pt is also aware that the prescription history is available to [place] online through the [facility] CSRS. Controlled substance agreement signed previously. We reminded [name] that all patients receiving Schedule II narcotics must be seen for follow within one month of prescription being requested. We reviewed the terms of our pain agreement, including the need to keep medicines in a safe locked location away from children or pets, and the need to report excess sedation or constipation, measures to avoid constipation, and policies related to early refills and stolen prescriptions. According to the [place] [facility] program, we have reviewed details related to analgesia, adverse effects, aberrant behaviors. - oxyCODONE (ROXICODONE) 15 MG immediate release tablet; Take 1 tablet (15 mg total) by mouth every 4 (four) hours as needed for pain.  Dispense: 90 tablet; Refill: 0 - CBC with Differential - Reticulocytes - COMPLETE METABOLIC PANEL WITH GFR - POCT urinalysis dipstick - POCT urinalysis dip (device)  2. Pregnancy at early stage Reminded [name] that hydroxyurea is a teratogen. Patient is currently [redacted] weeks pregnant. I advised [name] not to continue medication to stop immediately. She states that she will not seek an OB/gyn and she has not decided whether she will "keep the baby". Patient advised to continue folic acid 2 mg and maintain a balanced diet as she makes her decision. Patient states that she is in touch with counselor at  [name].     RTC: 1 month for sickle cell anemia and medication management Dorena Dew, FNP

## 2015-05-29 NOTE — Patient Instructions (Addendum)
Refrain from taking hydroxyurea during pregnancy.   Sickle Cell Anemia, Adult Sickle cell anemia is a condition in which red blood cells have an abnormal "sickle" shape. This abnormal shape shortens the cells' life span, which results in a lower than normal concentration of red blood cells in the blood. The sickle shape also causes the cells to clump together and block free blood flow through the blood vessels. As a result, the tissues and organs of the body do not receive enough oxygen. Sickle cell anemia causes organ damage and pain and increases the risk of infection. CAUSES  Sickle cell anemia is a genetic disorder. Those who receive two copies of the gene have the condition, and those who receive one copy have the trait. RISK FACTORS The sickle cell gene is most common in people whose families originated in [place]. Other areas of the globe where sickle cell trait occurs include the [place], [place] and [place], and the [place].  SIGNS AND SYMPTOMS  Pain, especially in the extremities, back, chest, or abdomen (common). The pain may start suddenly or may develop following an illness, especially if there is dehydration. Pain can also occur due to overexertion or exposure to extreme temperature changes.  Frequent severe bacterial infections, especially certain types of pneumonia and meningitis.  Pain and swelling in the hands and feet.  Decreased activity.   Loss of appetite.   Change in behavior.  Headaches.  Seizures.  Shortness of breath or difficulty breathing.  Vision changes.  Skin ulcers. Those with the trait may not have symptoms or they may have mild symptoms.  DIAGNOSIS  Sickle cell anemia is diagnosed with blood tests that demonstrate the genetic trait. It is often diagnosed during the newborn period, due to mandatory testing nationwide. A variety of blood tests, X-rays, CT scans, MRI scans, ultrasounds, and lung function tests may also be  done to monitor the condition. TREATMENT  Sickle cell anemia may be treated with:  Medicines. You may be given pain medicines, antibiotic medicines (to treat and prevent infections) or medicines to increase the production of certain types of hemoglobin.  Fluids.  Oxygen.  Blood transfusions. HOME CARE INSTRUCTIONS   Drink enough fluid to keep your urine clear or pale yellow. Increase your fluid intake in hot weather and during exercise.  Do not smoke. Smoking lowers oxygen levels in the blood.   Only take over-the-counter or prescription medicines for pain, fever, or discomfort as directed by your health care provider.  Take antibiotics as directed by your health care provider. Make sure you finish them it even if you start to feel better.   Take supplements as directed by your health care provider.   Consider wearing a medical alert bracelet. This tells anyone caring for you in an emergency of your condition.   When traveling, keep your medical information, health care provider's names, and the medicines you take with you at all times.   If you develop a fever, do not take medicines to reduce the fever right away. This could cover up a problem that is developing. Notify your health care provider.  Keep all follow-up appointments with your health care provider. Sickle cell anemia requires regular medical care. SEEK MEDICAL CARE IF: You have a fever. SEEK IMMEDIATE MEDICAL CARE IF:   You feel dizzy or faint.   You have new abdominal pain, especially on the left side near the stomach area.   You develop a persistent, often uncomfortable and painful penile erection (  priapism). If this is not treated immediately it will lead to impotence.   You have numbness your arms or legs or you have a hard time moving them.   You have a hard time with speech.   You have a fever or persistent symptoms for more than 2-3 days.   You have a fever and your symptoms suddenly get  worse.   You have signs or symptoms of infection. These include:   Chills.   Abnormal tiredness (lethargy).   Irritability.   Poor eating.   Vomiting.   You develop pain that is not helped with medicine.   You develop shortness of breath.  You have pain in your chest.   You are coughing up pus-like or bloody sputum.   You develop a stiff neck.  Your feet or hands swell or have pain.  Your abdomen appears bloated.  You develop joint pain. MAKE SURE YOU:  Understand these instructions.   This information is not intended to replace advice given to you by your health care provider. Make sure you discuss any questions you have with your health care provider.   Document Released: 08/10/2005 Document Revised: 05/23/2014 Document Reviewed: 12/12/2012 Elsevier Interactive Patient Education [employer].

## 2015-05-30 LAB — CBC WITH DIFFERENTIAL/PLATELET
Basophils Absolute: 0 10*3/uL (ref 0.0–0.1)
Basophils Relative: 0 % (ref 0–1)
Eosinophils Absolute: 0.2 10*3/uL (ref 0.0–0.7)
Eosinophils Relative: 1 % (ref 0–5)
HCT: 25.9 % — ABNORMAL LOW (ref 36.0–46.0)
HEMOGLOBIN: 8.8 g/dL — AB (ref 12.0–15.0)
LYMPHS ABS: 1.7 10*3/uL (ref 0.7–4.0)
Lymphocytes Relative: 11 % — ABNORMAL LOW (ref 12–46)
MCH: 31.1 pg (ref 26.0–34.0)
MCHC: 34 g/dL (ref 30.0–36.0)
MCV: 91.5 fL (ref 78.0–100.0)
MONO ABS: 1.1 10*3/uL — AB (ref 0.1–1.0)
MONOS PCT: 7 % (ref 3–12)
MPV: 9 fL (ref 8.6–12.4)
NEUTROS ABS: 12.6 10*3/uL — AB (ref 1.7–7.7)
NEUTROS PCT: 81 % — AB (ref 43–77)
Platelets: 478 10*3/uL — ABNORMAL HIGH (ref 150–400)
RBC: 2.83 MIL/uL — ABNORMAL LOW (ref 3.87–5.11)
RDW: 16.7 % — ABNORMAL HIGH (ref 11.5–15.5)
WBC: 15.5 10*3/uL — ABNORMAL HIGH (ref 4.0–10.5)

## 2015-05-30 LAB — RETICULOCYTES
ABS Retic: 393.4 10*3/uL — ABNORMAL HIGH (ref 19.0–186.0)
RBC.: 2.83 MIL/uL — ABNORMAL LOW (ref 3.87–5.11)
Retic Ct Pct: 13.9 % — ABNORMAL HIGH (ref 0.4–2.3)

## 2015-05-31 ENCOUNTER — Encounter (HOSPITAL_COMMUNITY): Payer: Self-pay | Admitting: Emergency Medicine

## 2015-05-31 ENCOUNTER — Inpatient Hospital Stay (HOSPITAL_COMMUNITY)
Admission: EM | Admit: 2015-05-31 | Discharge: 2015-06-05 | DRG: 781 | Disposition: A | Discharge status: Home / Self Care | Payer: Medicare Other | Attending: Internal Medicine | Admitting: Internal Medicine

## 2015-05-31 DIAGNOSIS — M25521 Pain in right elbow: Secondary | ICD-10-CM | POA: Diagnosis not present

## 2015-05-31 DIAGNOSIS — Z87891 Personal history of nicotine dependence: Secondary | ICD-10-CM

## 2015-05-31 DIAGNOSIS — O2691 Pregnancy related conditions, unspecified, first trimester: Secondary | ICD-10-CM | POA: Diagnosis present

## 2015-05-31 DIAGNOSIS — O99011 Anemia complicating pregnancy, first trimester: Principal | ICD-10-CM | POA: Diagnosis present

## 2015-05-31 DIAGNOSIS — F329 Major depressive disorder, single episode, unspecified: Secondary | ICD-10-CM | POA: Diagnosis present

## 2015-05-31 DIAGNOSIS — Z3A14 14 weeks gestation of pregnancy: Secondary | ICD-10-CM | POA: Diagnosis not present

## 2015-05-31 DIAGNOSIS — D5700 Hb-SS disease with crisis, unspecified: Secondary | ICD-10-CM | POA: Diagnosis not present

## 2015-05-31 DIAGNOSIS — D72829 Elevated white blood cell count, unspecified: Secondary | ICD-10-CM

## 2015-05-31 DIAGNOSIS — K219 Gastro-esophageal reflux disease without esophagitis: Secondary | ICD-10-CM | POA: Diagnosis present

## 2015-05-31 DIAGNOSIS — O99341 Other mental disorders complicating pregnancy, first trimester: Secondary | ICD-10-CM | POA: Diagnosis present

## 2015-05-31 DIAGNOSIS — D638 Anemia in other chronic diseases classified elsewhere: Secondary | ICD-10-CM

## 2015-05-31 DIAGNOSIS — Z452 Encounter for adjustment and management of vascular access device: Secondary | ICD-10-CM | POA: Diagnosis not present

## 2015-05-31 LAB — CBC WITH DIFFERENTIAL/PLATELET
BASOS PCT: 0 %
Basophils Absolute: 0 10*3/uL (ref 0.0–0.1)
Eosinophils Absolute: 0.2 10*3/uL (ref 0.0–0.7)
Eosinophils Relative: 1 %
HEMATOCRIT: 24.3 % — AB (ref 36.0–46.0)
HEMOGLOBIN: 8.3 g/dL — AB (ref 12.0–15.0)
LYMPHS ABS: 1.4 10*3/uL (ref 0.7–4.0)
LYMPHS PCT: 8 %
MCH: 30.7 pg (ref 26.0–34.0)
MCHC: 34.2 g/dL (ref 30.0–36.0)
MCV: 90 fL (ref 78.0–100.0)
MONO ABS: 1.5 10*3/uL — AB (ref 0.1–1.0)
MONOS PCT: 8 %
NEUTROS ABS: 15.2 10*3/uL — AB (ref 1.7–7.7)
NEUTROS PCT: 83 %
Platelets: 453 10*3/uL — ABNORMAL HIGH (ref 150–400)
RBC: 2.7 MIL/uL — ABNORMAL LOW (ref 3.87–5.11)
RDW: 17.7 % — ABNORMAL HIGH (ref 11.5–15.5)
WBC: 18.3 10*3/uL — ABNORMAL HIGH (ref 4.0–10.5)

## 2015-05-31 LAB — COMPREHENSIVE METABOLIC PANEL
ALBUMIN: 3.7 g/dL (ref 3.5–5.0)
ALT: 20 U/L (ref 14–54)
AST: 27 U/L (ref 15–41)
Alkaline Phosphatase: 46 U/L (ref 38–126)
Anion gap: 8 (ref 5–15)
BILIRUBIN TOTAL: 2.1 mg/dL — AB (ref 0.3–1.2)
BUN: 8 mg/dL (ref 6–20)
CHLORIDE: 106 mmol/L (ref 101–111)
CO2: 21 mmol/L — ABNORMAL LOW (ref 22–32)
CREATININE: 0.42 mg/dL — AB (ref 0.44–1.00)
Calcium: 8.8 mg/dL — ABNORMAL LOW (ref 8.9–10.3)
GFR calc Af Amer: >60 mL/min (ref >60)
GFR calc non Af Amer: >60 mL/min (ref >60)
GLUCOSE: 94 mg/dL (ref 65–99)
POTASSIUM: 4.1 mmol/L (ref 3.5–5.1)
Sodium: 135 mmol/L (ref 135–145)
Total Protein: 6.8 g/dL (ref 6.5–8.1)

## 2015-05-31 LAB — RETICULOCYTES
RBC.: 2.7 MIL/uL — AB (ref 3.87–5.11)
RETIC COUNT ABSOLUTE: 526.5 10*3/uL — AB (ref 19.0–186.0)
Retic Ct Pct: 19.5 % — ABNORMAL HIGH (ref 0.4–3.1)

## 2015-05-31 LAB — LACTATE DEHYDROGENASE: LDH: 204 U/L — ABNORMAL HIGH (ref 98–192)

## 2015-05-31 MED ORDER — NALOXONE HCL 0.4 MG/ML IJ SOLN: 0.4000 mg | INTRAMUSCULAR | Status: DC | PRN

## 2015-05-31 MED ORDER — HYDROMORPHONE HCL 2 MG/ML IJ SOLN
2.0000 mg | INTRAMUSCULAR | Status: DC | PRN
  Administered 2015-06-01 (×2): 2 mg via INTRAVENOUS
  Filled 2015-05-31 (×2): qty 1

## 2015-05-31 MED ORDER — HYDROMORPHONE HCL 1 MG/ML IJ SOLN: 1.0000 mg | Freq: Once | INTRAMUSCULAR | Status: DC

## 2015-05-31 MED ORDER — HYDROMORPHONE HCL 1 MG/ML IJ SOLN
1.0000 mg | Freq: Once | INTRAMUSCULAR | Status: AC
  Administered 2015-05-31: 1 mg via INTRAVENOUS
  Filled 2015-05-31: qty 1

## 2015-05-31 MED ORDER — SODIUM CHLORIDE 0.9 % IJ SOLN: 9.0000 mL | INTRAMUSCULAR | Status: DC | PRN

## 2015-05-31 MED ORDER — METHADONE HCL 5 MG PO TABS
5.0000 mg | ORAL_TABLET | Freq: Every day | ORAL | Status: DC
  Administered 2015-05-31 – 2015-06-05 (×5): 5 mg via ORAL
  Filled 2015-05-31 (×5): qty 1

## 2015-05-31 MED ORDER — HYDROMORPHONE HCL 2 MG/ML IJ SOLN
2.0000 mg | Freq: Once | INTRAMUSCULAR | Status: AC
  Administered 2015-05-31: 2 mg via INTRAVENOUS
  Filled 2015-05-31: qty 1

## 2015-05-31 MED ORDER — FOLIC ACID 1 MG PO TABS
2.0000 mg | ORAL_TABLET | Freq: Every day | ORAL | Status: DC
  Administered 2015-05-31 – 2015-06-05 (×6): 2 mg via ORAL
  Filled 2015-05-31 (×6): qty 2

## 2015-05-31 MED ORDER — SENNOSIDES-DOCUSATE SODIUM 8.6-50 MG PO TABS
1.0000 | ORAL_TABLET | Freq: Two times a day (BID) | ORAL | Status: DC
  Administered 2015-05-31 – 2015-06-05 (×11): 1 via ORAL
  Filled 2015-05-31 (×11): qty 1

## 2015-05-31 MED ORDER — POLYETHYLENE GLYCOL 3350 17 G PO PACK
17.0000 g | PACK | Freq: Every day | ORAL | Status: DC | PRN
  Filled 2015-05-31: qty 1

## 2015-05-31 MED ORDER — DEXTROSE-NACL 5-0.45 % IV SOLN
INTRAVENOUS | Status: DC
  Administered 2015-05-31 – 2015-06-02 (×5): via INTRAVENOUS
  Administered 2015-06-02 (×2): 1000 mL via INTRAVENOUS
  Administered 2015-06-03 – 2015-06-04 (×7): via INTRAVENOUS

## 2015-05-31 MED ORDER — ENOXAPARIN SODIUM 40 MG/0.4ML ~~LOC~~ SOLN
40.0000 mg | SUBCUTANEOUS | Status: DC
  Administered 2015-05-31 – 2015-06-03 (×2): 40 mg via SUBCUTANEOUS
  Filled 2015-05-31 (×5): qty 0.4

## 2015-05-31 MED ORDER — ONDANSETRON HCL 4 MG/2ML IJ SOLN
4.0000 mg | Freq: Four times a day (QID) | INTRAMUSCULAR | Status: DC | PRN
  Administered 2015-05-31: 4 mg via INTRAVENOUS
  Filled 2015-05-31: qty 2

## 2015-05-31 MED ORDER — HYDROMORPHONE HCL 2 MG/ML IJ SOLN
2.0000 mg | INTRAMUSCULAR | Status: DC
  Administered 2015-05-31 – 2015-06-01 (×11): 2 mg via INTRAVENOUS
  Filled 2015-05-31 (×11): qty 1

## 2015-05-31 MED ORDER — HYDROMORPHONE 1 MG/ML IV SOLN
INTRAVENOUS | Status: DC
  Administered 2015-05-31: 17:00:00 via INTRAVENOUS
  Administered 2015-05-31: 3.5 mg via INTRAVENOUS
  Administered 2015-06-01: 4 mg via INTRAVENOUS
  Administered 2015-06-01: 3.5 mg via INTRAVENOUS
  Administered 2015-06-01: 5 mg via INTRAVENOUS
  Administered 2015-06-01: 2 mg via INTRAVENOUS
  Administered 2015-06-01: 5.5 mg via INTRAVENOUS
  Administered 2015-06-01: 18:00:00 via INTRAVENOUS
  Administered 2015-06-01: 3 mg via INTRAVENOUS
  Administered 2015-06-02: 4 mg via INTRAVENOUS
  Administered 2015-06-02: 5.5 mg via INTRAVENOUS
  Administered 2015-06-02: 0 mg via INTRAVENOUS
  Administered 2015-06-02: 19:00:00 via INTRAVENOUS
  Administered 2015-06-02: 6.5 mg via INTRAVENOUS
  Administered 2015-06-02: 3 mg via INTRAVENOUS
  Administered 2015-06-02: 0 mg via INTRAVENOUS
  Administered 2015-06-03: 2.5 mg via INTRAVENOUS
  Administered 2015-06-03: 1 mg via INTRAVENOUS
  Administered 2015-06-03: 4.5 mg via INTRAVENOUS
  Administered 2015-06-03: 3 mg via INTRAVENOUS
  Administered 2015-06-03: 7.5 mg via INTRAVENOUS
  Administered 2015-06-03: 2 mg via INTRAVENOUS
  Administered 2015-06-04: 1.5 mg via INTRAVENOUS
  Administered 2015-06-04: 4 mg via INTRAVENOUS
  Administered 2015-06-04: 2.5 mg via INTRAVENOUS
  Administered 2015-06-04: 5.5 mg via INTRAVENOUS
  Administered 2015-06-04: 04:00:00 via INTRAVENOUS
  Administered 2015-06-04: 2.37 mg via INTRAVENOUS
  Filled 2015-05-31 (×4): qty 25

## 2015-05-31 NOTE — ED Notes (Signed)
ATTEMPT IV X 2 NOT SUCCESSFUL. DELAY OF LAB DRAW

## 2015-05-31 NOTE — ED Notes (Signed)
Pt states that she is having SCC pain in her rt elbow radiating to rt shoulder since 0300 this morning.  States she went out last night and believes the cold weather triggered it.  States she took 2 oxycodone 10 mg about 6 am this morning.  Tried heat and hot shower as well without relief.

## 2015-05-31 NOTE — ED Notes (Signed)
Report given to [place]. Pt's bed ready.

## 2015-05-31 NOTE — H&P (Signed)
Hospital Admission Note Date: 05/31/2015  Patient name: [name] Medical record number: [id number] Date of birth: May 14, 1991 Age: 25 y.o. Gender: female PCP: Angelica Chessman, MD  Attending physician: [name], MD  Chief Complaint: Pain in right arm and elbow x 1 day  History of Present Illness: This is a patient who is opiate tolerant and presents with pain in the elbow which is consistent with pain of Sickle Cell Disease. She describes the pain as intermittently sharp and throbbing and at an intensity of 10/10. She reports that she feels that it was triggered by an outing of bowling yesterday, She tried to manage the pain with her oral analgesics and was unsuccessful. She denies any fevers, chills, vomiting or diarrhea. She has had some nausea. Pt is currently [redacted] weeks pregnant but is planning on terminating pregnancy.   In the ED she was managed with 3 doses of Dilaudid 1 mg over the last 4 hours but did not receive any IVF. Currently her pain is at 9/10 and she reports that the management in the ED has afforded no relief until she received the last dose of medication.  Scheduled Meds: .  HYDROmorphone (DILAUDID) injection  1 mg Intravenous Once  .  HYDROmorphone (DILAUDID) injection  2 mg Intravenous Once   Continuous Infusions: . dextrose 5 % and 0.45% NaCl 125 mL/hr at 05/31/15 1342   PRN Meds:. Allergies: Carrot; Carrot oil; and Latex Past Medical History  Diagnosis Date  . Miscarriage 03/22/2011    Pt reports 2 miscarriages.  . Depression 01/06/2011  . GERD (gastroesophageal reflux disease) 02/17/2011  . Trichotillomania     h/o  . Blood transfusion     "lots"  . Sickle cell anemia with crisis ([place])   . Exertional dyspnea     "sometimes"  . Migraines 11/08/11    "@ least twice/month"  . Chronic back pain     "very severe; have knot in my back; from tight muscle; take RX and exercise for it"  . Mood swings ([place]) 11/08/11    "I go back and forth; real  bad"  . Sickle cell anemia [facility])    Past Surgical History  Procedure Laterality Date  . Cholecystectomy  05/2010  . Dilation and curettage of uterus  02/20/11    S/P miscarriage   Family History  Problem Relation Age of Onset  . Diabetes Maternal Grandmother   . Diabetes Paternal Grandmother   . Hypertension Paternal Grandmother   . Diabetes Maternal Grandfather   . Sickle cell trait Mother   . Sickle cell trait Father    Social History   Social History  . Marital Status: Single    Spouse Name: N/A  . Number of Children: N/A  . Years of Education: N/A   Occupational History  . Not on file.   Social History Main Topics  . Smoking status: Former Smoker -- 0.25 packs/day for 1 years    Types: Cigarettes    Quit date: 03/25/2013  . Smokeless tobacco: Never Used  . Alcohol Use: No     Comment: pt states she quit marijuan in May 2013. Rare ETOH, + cigarettes.  She is enrolled in school  . Drug Use: No  . Sexual Activity: Yes    Birth Control/ Protection: None   Other Topics Concern  . Not on file   Social History Narrative   Lives  Wit mother   FOB is supportive-supposed to be moving next month   Is a  student at [employer] time   Review of Systems: A comprehensive review of systems was negative except as noted in the HPI.  Physical Exam: No intake or output data in the 24 hours ending 05/31/15 1401 General: Alert, awake, oriented x3, in moderate distress.  HEENT: [place]/AT PEERL, EOMI, anicteric Neck: Trachea midline,  no masses, no thyromegal,y no JVD, no carotid bruit OROPHARYNX:  Moist, No exudate/ erythema/lesions.  Heart: Regular rate and rhythm, without murmurs, rubs, gallops, PMI non-displaced, no heaves or thrills on palpation.  Lungs: Clear to auscultation, no wheezing or rhonchi noted. No increased vocal fremitus resonant to percussion  Abdomen: Soft, nontender, nondistended, positive bowel sounds, no masses no hepatosplenomegaly noted.  Neuro: No focal  neurological deficits noted cranial nerves II through XII grossly intact.  Strength at functional baseline in bilateral upper and lower extremities. However has significant pain with palpation of RUE. Musculoskeletal: No warmth swelling or erythema around joints, no spinal tenderness noted. However tenderness noted in RUE in area of reported pain. No warmth or erythema noted in that area. Psychiatric: Patient alert and oriented x3, good insight and cognition, good recent to remote recall.   Lab results:  Recent Labs  05/29/15 1126 05/31/15 1038  NA 137 135  K 4.4 4.1  CL 106 106  CO2 22 21*  GLUCOSE 84 94  BUN 4* 8  CREATININE 0.43* 0.42*  CALCIUM 9.1 8.8*    Recent Labs  05/29/15 1126 05/31/15 1038  AST 19 27  ALT 20 20  ALKPHOS 48 46  BILITOT 2.1* 2.1*  PROT 6.3 6.8  ALBUMIN 3.7 3.7   No results for input(s): LIPASE, AMYLASE in the last 72 hours.  Recent Labs  05/29/15 1126 05/31/15 1038  WBC 15.5* 18.3*  NEUTROABS 12.6* 15.2*  HGB 8.8* 8.3*  HCT 25.9* 24.3*  MCV 91.5 90.0  PLT 478* 453*   No results for input(s): CKTOTAL, CKMB, CKMBINDEX, TROPONINI in the last 72 hours. Invalid input(s): POCBNP No results for input(s): DDIMER in the last 72 hours. No results for input(s): HGBA1C in the last 72 hours. No results for input(s): CHOL, HDL, LDLCALC, TRIG, CHOLHDL, LDLDIRECT in the last 72 hours. No results for input(s): TSH, T4TOTAL, T3FREE, THYROIDAB in the last 72 hours.  Invalid input(s): [place]  05/29/15 1126 05/31/15 1038  RETICCTPCT 13.9* 19.5*   Imaging results:  US Ob Comp Less 14 Wks  05/02/2015  CLINICAL DATA:  25 year old pregnant female with pelvic pain and vomiting for 1 day. Uncertain dates. EXAM: LIMITED OBSTETRIC ULTRASOUND FINDINGS: Number of Fetuses: 1 Heart Rate:  150 bpm Movement: Yes Presentation: Transverse head maternal right Placental Location: Posterior Previa: No Amniotic Fluid (Subjective):  Within normal limits.  BPD:  2.01cm 13w  1d MATERNAL FINDINGS: Cervix:  Appears closed. Uterus/Adnexae: Ovaries not visualized. No subchorionic hemorrhage, free fluid or adnexal mass. IMPRESSION: Single living intrauterine gestation with estimated gestational age of [redacted] weeks 1 day by this ultrasound. No acute abnormalities identified. This exam is performed on an emergent basis and does not comprehensively evaluate fetal size, dating, or anatomy; follow-up complete OB US should be considered if further fetal assessment is warranted. Electronically Signed   By: Margarette Canada M.D.   On: 05/02/2015 15:06     Assessment and Plan: 1. Hb SS with Crisis: Will treat pain with Dilaudid via PCA, scheduled clinician assisted doses and hypotonic IVF.  Pt is unable to receive NSAID's due to pregnant state. 2. Anemia of Chronic DiseaseHb currently at  baseline and she demonstrates a robust reticulocytosis which is appropriate for level of Hb and state of crisis.  3. Leukocytosis: Pt shows no evidence of infection. Likely reactive and inflammatory associated with crisis. 4. 1st Trimester Pregnancy: Pt currently without any bleeding or vaginal discharge. She has not yet sought Ob care and reports plans of termination in the next few weeks.  5. F/E/N: Hypotonic IVF. Regular diet.  6. DVT Prophylaxis: Lovenox.  Time spent 45 minutes  MATTHEWS,MICHELLE A. 05/31/2015, 2:01 PM

## 2015-05-31 NOTE — ED Notes (Signed)
ED PA at bedside

## 2015-05-31 NOTE — ED Notes (Signed)
Bed: WA03 Expected date:  Expected time:  Means of arrival:  Comments: 

## 2015-05-31 NOTE — ED Provider Notes (Signed)
CSN: [id number]     Arrival date & time 05/31/15  0830 History   First MD Initiated Contact with Patient 05/31/15 0901     Chief Complaint  Patient presents with  . Sickle Cell Pain Crisis   HPI  [name] is a 25 y.o. F PMH significant for sickle cell anemia, depression, trichotillomania, presenting with a SCC since 0300 this morning. She is having pain in her right elbow radiating to right shoulder. She went out bowling last night and thinks the weather triggered her crisis. She took 2 oxycodone 10 mg about 6 am this morning, and tried heat and hot shower without relief. No fevers, chills, CP, SOB, abdominal pain, N/V, change in bowel/bladder habits.   Past Medical History  Diagnosis Date  . Miscarriage 03/22/2011    Pt reports 2 miscarriages.  . Depression 01/06/2011  . GERD (gastroesophageal reflux disease) 02/17/2011  . Trichotillomania     h/o  . Blood transfusion     "lots"  . Sickle cell anemia with crisis ([place])   . Exertional dyspnea     "sometimes"  . Migraines 11/08/11    "@ least twice/month"  . Chronic back pain     "very severe; have knot in my back; from tight muscle; take RX and exercise for it"  . Mood swings ([place]) 11/08/11    "I go back and forth; real bad"  . Sickle cell anemia [facility])    Past Surgical History  Procedure Laterality Date  . Cholecystectomy  05/2010  . Dilation and curettage of uterus  02/20/11    S/P miscarriage   Family History  Problem Relation Age of Onset  . Diabetes Maternal Grandmother   . Diabetes Paternal Grandmother   . Hypertension Paternal Grandmother   . Diabetes Maternal Grandfather   . Sickle cell trait Mother   . Sickle cell trait Father    Social History  Substance Use Topics  . Smoking status: Former Smoker -- 0.25 packs/day for 1 years    Types: Cigarettes    Quit date: 03/25/2013  . Smokeless tobacco: Never Used  . Alcohol Use: No     Comment: pt states she quit marijuan in May 2013. Rare ETOH, + cigarettes.   She is enrolled in school   OB History    Gravida Para Term Preterm AB TAB SAB Ectopic Multiple Living   3 1 1  1  1   0 1      Obstetric Comments   Miscarried in October 2012 at about 7 weeks     Review of Systems  Ten systems are reviewed and are negative for acute change except as noted in the HPI  Allergies  Carrot; Carrot oil; and Latex  Home Medications   Prior to Admission medications   Medication Sig Start Date End Date Taking? Authorizing Provider  acetaminophen (TYLENOL) 500 MG tablet Take 1,000 mg by mouth every 6 (six) hours as needed for moderate pain.   Yes Historical Provider, MD  aspirin-acetaminophen-caffeine (EXCEDRIN MIGRAINE) [phone] MG tablet Take 1 tablet by mouth daily as needed for headache.   Yes Historical Provider, MD  ferrous sulfate 325 (65 FE) MG tablet Take 325 mg by mouth daily with breakfast.   Yes Historical Provider, MD  folic acid (FOLVITE) 1 MG tablet Take 2 tablets (2 mg total) by mouth daily. 03/26/15  Yes [name], MD  methadone (DOLOPHINE) 5 MG tablet Take 1 tablet (5 mg total) by mouth at bedtime. 05/01/15  Yes  Tresa Garter, MD  oxyCODONE (ROXICODONE) 15 MG immediate release tablet Take 1 tablet (15 mg total) by mouth every 4 (four) hours as needed for pain. 05/29/15  Yes Dorena Dew, FNP   BP 100/51 mmHg  Pulse 85  Temp(Src) 98.6 F (37 C) (Oral)  Resp 14  SpO2 100%  LMP 02/09/2015 Physical Exam  Constitutional: She appears well-developed and well-nourished. No distress.  HENT:  Head: Normocephalic and atraumatic.  Mouth/Throat: Oropharynx is clear and moist. No oropharyngeal exudate.  Eyes: Conjunctivae are normal. Pupils are equal, round, and reactive to light. Right eye exhibits no discharge. Left eye exhibits no discharge. No scleral icterus.  Neck: Normal range of motion. No tracheal deviation present.  Cardiovascular: Normal rate, regular rhythm, normal heart sounds and intact distal pulses.  Exam  reveals no gallop and no friction rub.   No murmur heard. Pulmonary/Chest: Effort normal and breath sounds normal. No respiratory distress. She has no wheezes. She has no rales. She exhibits no tenderness.  Abdominal: Soft. Bowel sounds are normal. She exhibits no distension and no mass. There is no tenderness. There is no rebound and no guarding.  Musculoskeletal: Normal range of motion. She exhibits tenderness. She exhibits no edema.  Diffuse tenderness along right arm. No erythema, edema, discolorations. Neurovascularly intact BL.   Lymphadenopathy:    She has no cervical adenopathy.  Neurological: She is alert. Coordination normal.  Skin: Skin is warm and dry. No rash noted. She is not diaphoretic. No erythema.  Psychiatric: She has a normal mood and affect. Her behavior is normal.  Nursing note and vitals reviewed.   ED Course  Procedures  Labs Review Labs Reviewed  CBC WITH DIFFERENTIAL/PLATELET - Abnormal; Notable for the following:    WBC 18.3 (*)    RBC 2.70 (*)    Hemoglobin 8.3 (*)    HCT 24.3 (*)    RDW 17.7 (*)    Platelets 453 (*)    Neutro Abs 15.2 (*)    Monocytes Absolute 1.5 (*)    All other components within normal limits  RETICULOCYTES - Abnormal; Notable for the following:    Retic Ct Pct 19.5 (*)    RBC. 2.70 (*)    Retic Count, Manual 526.5 (*)    All other components within normal limits  COMPREHENSIVE METABOLIC PANEL - Abnormal; Notable for the following:    CO2 21 (*)    Creatinine, Ser 0.42 (*)    Calcium 8.8 (*)    Total Bilirubin 2.1 (*)    All other components within normal limits    MDM   Final diagnoses:  None   Patient non-toxic appearing and VSS. Sickle cell crisis. Do not suspect ACS, CVA, infectious etiology.  Patient with no pain relief s/p 3 doses of 1 mg dilaudid q30 minutes. Dr. Zigmund Daniel agrees to admission.  Patient may be safely admitted. Patient in understanding and agreement with the plan.   [place],  PA-C 05/31/15 [place], MD 06/01/15 [phone]

## 2015-06-01 ENCOUNTER — Telehealth: Payer: Self-pay | Admitting: Internal Medicine

## 2015-06-01 DIAGNOSIS — G8929 Other chronic pain: Secondary | ICD-10-CM

## 2015-06-01 MED ORDER — HYDROMORPHONE HCL 2 MG/ML IJ SOLN
2.0000 mg | INTRAMUSCULAR | Status: DC | PRN
  Administered 2015-06-02 – 2015-06-05 (×25): 2 mg via INTRAVENOUS
  Filled 2015-06-01 (×26): qty 1

## 2015-06-01 MED ORDER — DIPHENHYDRAMINE HCL 50 MG/ML IJ SOLN
12.5000 mg | Freq: Four times a day (QID) | INTRAMUSCULAR | Status: DC | PRN
  Administered 2015-06-01 – 2015-06-04 (×8): 12.5 mg via INTRAVENOUS
  Filled 2015-06-01 (×8): qty 1

## 2015-06-01 NOTE — Progress Notes (Signed)
SICKLE CELL SERVICE PROGRESS NOTE  [name] [id number] DOB: 16-Oct-1990 DOA: 05/31/2015 PCP: Angelica Chessman, MD  Assessment/Plan: Active Problems:   Hb-SS disease with crisis ([place])  1. Hb SS with crisis: Pt reports pain in RUE and now in B/L thighs. She reports lowest pain score today in her arm at 6/10. However pain now at 8/10 in BLE's. She has used 21.5 mg with 48/43:demands/deliveries in the last 22 hours in addition to the clinician assisted doses. She is unable to receive NSAID's due to her pregnancy. I will continue current therapeutic plan and re-assess in the morning.. 2. Leukocytosis: Pt has a greater elevation of WBC today. No evidence of infection.  3. Anemia of chronic disease. Hb at baseline. No indications for transfusion. 4. Chronic pain: Continue Methadone 5. Pregnancy: Pt [redacted] wks GA. Plans termination in a few weeks.   Code Status: Full Code Family Communication: N/A Disposition Plan: Not yet ready for discharge  [place].  Pager [phone]. If 7PM-7AM, please contact night-coverage.  06/01/2015, 2:37 PM  LOS: 1 day   Interim History: Pt reports pain in RUE and now in B/L thighs. She reports lowest pain score today in her arm at 6/10. However pain now at 8/10 in BLE's. No BM today.  Consultants:  None  Procedures:  None  Antibiotics:  None   Objective: Filed Vitals:   06/01/15 0605 06/01/15 0815 06/01/15 1013 06/01/15 1238  BP: 105/60  97/57   Pulse: 90  85   Temp: 98 F (36.7 C)  98.2 F (36.8 C)   TempSrc: Oral  Oral   Resp: 12 13 13 16   Height:      Weight:      SpO2: 98% 99% 98% 97%   Weight change:  No intake or output data in the 24 hours ending 06/01/15 1437  General: Alert, awake, oriented x3, in no acute distress.  HEENT: [place]/AT PEERL, EOMI, anicteric. Neck: Trachea midline,  no masses, no thyromegal,y no JVD, no carotid bruit OROPHARYNX:  Moist, No exudate/ erythema/lesions.  Heart: Regular rate and rhythm,  without murmurs, rubs, gallops, PMI non-displaced, no heaves or thrills on palpation.  Lungs: Clear to auscultation, no wheezing or rhonchi noted. No increased vocal fremitus resonant to percussion  Abdomen: Soft, nontender, nondistended, positive bowel sounds, no masses no hepatosplenomegaly noted.  Neuro: No focal neurological deficits noted cranial nerves II through XII grossly intact.  Strength at functional baseline in bilateral upper and lower extremities. Musculoskeletal: No warmth swelling or erythema around joints, no spinal tenderness noted. Psychiatric: Patient alert and oriented x3, good insight and cognition, good recent to remote recall.    Data Reviewed: Basic Metabolic Panel:  Recent Labs Lab 05/29/15 1126 05/31/15 1038  NA 137 135  K 4.4 4.1  CL 106 106  CO2 22 21*  GLUCOSE 84 94  BUN 4* 8  CREATININE 0.43* 0.42*  CALCIUM 9.1 8.8*   Liver Function Tests:  Recent Labs Lab 05/29/15 1126 05/31/15 1038  AST 19 27  ALT 20 20  ALKPHOS 48 46  BILITOT 2.1* 2.1*  PROT 6.3 6.8  ALBUMIN 3.7 3.7   No results for input(s): LIPASE, AMYLASE in the last 168 hours. No results for input(s): AMMONIA in the last 168 hours. CBC:  Recent Labs Lab 05/29/15 1126 05/31/15 1038  WBC 15.5* 18.3*  NEUTROABS 12.6* 15.2*  HGB 8.8* 8.3*  HCT 25.9* 24.3*  MCV 91.5 90.0  PLT 478* 453*   Cardiac Enzymes: No results for input(s):  CKTOTAL, CKMB, CKMBINDEX, TROPONINI in the last 168 hours. BNP (last 3 results) No results for input(s): BNP in the last 8760 hours.  ProBNP (last 3 results) No results for input(s): PROBNP in the last 8760 hours.  CBG: No results for input(s): GLUCAP in the last 168 hours.  No results found for this or any previous visit (from the past 240 hour(s)).   Studies: US Ob Comp Less 14 Wks  May 31, 2015  CLINICAL DATA:  25 year old pregnant female with pelvic pain and vomiting for 1 day. Uncertain dates. EXAM: LIMITED OBSTETRIC ULTRASOUND FINDINGS:  Number of Fetuses: 1 Heart Rate:  150 bpm Movement: Yes Presentation: Transverse head maternal right Placental Location: Posterior Previa: No Amniotic Fluid (Subjective):  Within normal limits. BPD:  2.01cm 13w  1d MATERNAL FINDINGS: Cervix:  Appears closed. Uterus/Adnexae: Ovaries not visualized. No subchorionic hemorrhage, free fluid or adnexal mass. IMPRESSION: Single living intrauterine gestation with estimated gestational age of [redacted] weeks 1 day by this ultrasound. No acute abnormalities identified. This exam is performed on an emergent basis and does not comprehensively evaluate fetal size, dating, or anatomy; follow-up complete OB US should be considered if further fetal assessment is warranted. Electronically Signed   By: Margarette Canada M.D.   On: 05-31-2015 15:06    Scheduled Meds: . enoxaparin (LOVENOX) injection  40 mg Subcutaneous Q24H  . folic acid  2 mg Oral Daily  . HYDROmorphone   Intravenous 6 times per day  .  HYDROmorphone (DILAUDID) injection  1 mg Intravenous Once  .  HYDROmorphone (DILAUDID) injection  2 mg Intravenous Q2H  . methadone  5 mg Oral QHS  . senna-docusate  1 tablet Oral BID   Continuous Infusions: . dextrose 5 % and 0.45% NaCl 125 mL/hr at 06/01/15 1025    Time Spent 30 minutes.

## 2015-06-01 NOTE — Telephone Encounter (Signed)
Refill request on methadone 5mg . LOV 05/29/2015. Please advise. Thanks!     Oxycodone was filled on 05/29/2015

## 2015-06-01 NOTE — Plan of Care (Signed)
Problem: Phase I Progression Outcomes Goal: Bowel Movement At Least Every 3 Days Outcome: Progressing BM 1/15, pt stated stool was hard; senna scheduled.

## 2015-06-02 ENCOUNTER — Telehealth: Payer: Self-pay | Admitting: Clinical

## 2015-06-02 ENCOUNTER — Inpatient Hospital Stay (HOSPITAL_COMMUNITY): Payer: Medicare Other

## 2015-06-02 LAB — RETICULOCYTES
RBC.: 2.58 MIL/uL — ABNORMAL LOW (ref 3.87–5.11)
RETIC COUNT ABSOLUTE: 544.4 10*3/uL — AB (ref 19.0–186.0)
Retic Ct Pct: 21.1 % — ABNORMAL HIGH (ref 0.4–3.1)

## 2015-06-02 LAB — CBC WITH DIFFERENTIAL/PLATELET
BASOS ABS: 0 10*3/uL (ref 0.0–0.1)
Basophils Relative: 0 %
Eosinophils Absolute: 0.3 10*3/uL (ref 0.0–0.7)
Eosinophils Relative: 2 %
HEMATOCRIT: 23.8 % — AB (ref 36.0–46.0)
Hemoglobin: 8.2 g/dL — ABNORMAL LOW (ref 12.0–15.0)
LYMPHS PCT: 8 %
Lymphs Abs: 1.4 10*3/uL (ref 0.7–4.0)
MCH: 31.8 pg (ref 26.0–34.0)
MCHC: 34.5 g/dL (ref 30.0–36.0)
MCV: 92.2 fL (ref 78.0–100.0)
Monocytes Absolute: 1.1 10*3/uL — ABNORMAL HIGH (ref 0.1–1.0)
Monocytes Relative: 6 %
NEUTROS ABS: 14.9 10*3/uL — AB (ref 1.7–7.7)
NEUTROS PCT: 84 %
PLATELETS: 445 10*3/uL — AB (ref 150–400)
RBC: 2.58 MIL/uL — AB (ref 3.87–5.11)
RDW: 17.4 % — ABNORMAL HIGH (ref 11.5–15.5)
WBC: 17.7 10*3/uL — AB (ref 4.0–10.5)

## 2015-06-02 LAB — BASIC METABOLIC PANEL
ANION GAP: 11 (ref 5–15)
BUN: 5 mg/dL — AB (ref 6–20)
CHLORIDE: 108 mmol/L (ref 101–111)
CO2: 20 mmol/L — AB (ref 22–32)
Calcium: 9.3 mg/dL (ref 8.9–10.3)
Creatinine, Ser: 0.37 mg/dL — ABNORMAL LOW (ref 0.44–1.00)
GFR calc Af Amer: >60 mL/min (ref >60)
GFR calc non Af Amer: >60 mL/min (ref >60)
GLUCOSE: 99 mg/dL (ref 65–99)
POTASSIUM: 3.8 mmol/L (ref 3.5–5.1)
Sodium: 139 mmol/L (ref 135–145)

## 2015-06-02 MED ORDER — LIDOCAINE HCL 1 % IJ SOLN
INTRAMUSCULAR | Status: AC
  Filled 2015-06-02: qty 20

## 2015-06-02 MED ORDER — METHADONE HCL 5 MG PO TABS: 5.0000 mg | ORAL_TABLET | Freq: Every day | ORAL | Status: DC

## 2015-06-02 NOTE — Progress Notes (Signed)
SICKLE CELL SERVICE PROGRESS NOTE  [name] [id number] DOB: 11-07-1990 DOA: 05/31/2015 PCP: Angelica Chessman, MD  Assessment/Plan: Active Problems:   Hb-SS disease with crisis ([place])  1. Hb SS with crisis: Pt reports pain in RUE and now in B/L thighs. She reports lowest pain score today in her back at 6/10 .The pain in her arm is resolved and the pain in legs is 9/10.   She is unable to receive NSAID's due to her pregnancy. I will continue current therapeutic plan and re-assess in the morning.  2. Leukocytosis: Pt has a greater elevation of WBC today. No evidence of infection.  3. Anemia of chronic disease. Hb at baseline. No indications for transfusion. 4. Chronic pain: Continue Methadone 5. Pregnancy: Pt [redacted] wks GA. Plans termination in a few weeks.   Code Status: Full Code Family Communication: N/A Disposition Plan: Not yet ready for discharge  [place].  Pager [phone]. If 7PM-7AM, please contact night-coverage.  06/02/2015, 2:44 PM  LOS: 2 days   Interim History: Pt reports pain resolved in RUE and now in BLE's. She reports lowest pain score today in her back at 6/10. However pain now at 8/10 in BLE's. No BM today.  Consultants:  None  Procedures:  None  Antibiotics:  None   Objective: Filed Vitals:   06/02/15 0514 06/02/15 1000 06/02/15 1028 06/02/15 1315  BP: 100/68 106/67  110/70  Pulse: 86 88  80  Temp: 98.3 F (36.8 C) 98.2 F (36.8 C)  98.6 F (37 C)  TempSrc: Oral Oral  Oral  Resp: 18 16 14 15   Height:      Weight:      SpO2: 96% 97%  96%   Weight change:   Intake/Output Summary (Last 24 hours) at 06/02/15 1444 Last data filed at 06/02/15 1230  Gross per 24 hour  Intake 7267.75 ml  Output      0 ml  Net 7267.75 ml    General: Alert, awake, oriented x3, in no acute distress.  HEENT: [place]/AT PEERL, EOMI, anicteric. Neck: Trachea midline,  no masses, no thyromegal,y no JVD, no carotid bruit OROPHARYNX:  Moist, No  exudate/ erythema/lesions.  Heart: Regular rate and rhythm, without murmurs, rubs, gallops, PMI non-displaced, no heaves or thrills on palpation.  Lungs: Clear to auscultation, no wheezing or rhonchi noted. No increased vocal fremitus resonant to percussion  Abdomen: Soft, nontender, nondistended, positive bowel sounds, no masses no hepatosplenomegaly noted.  Neuro: No focal neurological deficits noted cranial nerves II through XII grossly intact.  Strength at functional baseline in bilateral upper and lower extremities. Musculoskeletal: No warmth swelling or erythema around joints, no spinal tenderness noted. Psychiatric: Patient alert and oriented x3, good insight and cognition, good recent to remote recall.    Data Reviewed: Basic Metabolic Panel:  Recent Labs Lab 05/29/15 1126 05/31/15 1038 06/02/15 1105  NA 137 135 139  K 4.4 4.1 3.8  CL 106 106 108  CO2 22 21* 20*  GLUCOSE 84 94 99  BUN 4* 8 5*  CREATININE 0.43* 0.42* 0.37*  CALCIUM 9.1 8.8* 9.3   Liver Function Tests:  Recent Labs Lab 05/29/15 1126 05/31/15 1038  AST 19 27  ALT 20 20  ALKPHOS 48 46  BILITOT 2.1* 2.1*  PROT 6.3 6.8  ALBUMIN 3.7 3.7   No results for input(s): LIPASE, AMYLASE in the last 168 hours. No results for input(s): AMMONIA in the last 168 hours. CBC:  Recent Labs Lab 05/29/15 1126 05/31/15 1038 06/02/15  1105  WBC 15.5* 18.3* 17.7*  NEUTROABS 12.6* 15.2* 14.9*  HGB 8.8* 8.3* 8.2*  HCT 25.9* 24.3* 23.8*  MCV 91.5 90.0 92.2  PLT 478* 453* 445*   Cardiac Enzymes: No results for input(s): CKTOTAL, CKMB, CKMBINDEX, TROPONINI in the last 168 hours. BNP (last 3 results) No results for input(s): BNP in the last 8760 hours.  ProBNP (last 3 results) No results for input(s): PROBNP in the last 8760 hours.  CBG: No results for input(s): GLUCAP in the last 168 hours.  No results found for this or any previous visit (from the past 240 hour(s)).   Studies: No results  found.  Scheduled Meds: . enoxaparin (LOVENOX) injection  40 mg Subcutaneous Q24H  . folic acid  2 mg Oral Daily  . HYDROmorphone   Intravenous 6 times per day  .  HYDROmorphone (DILAUDID) injection  1 mg Intravenous Once  . lidocaine      . methadone  5 mg Oral QHS  . senna-docusate  1 tablet Oral BID   Continuous Infusions: . dextrose 5 % and 0.45% NaCl 1,000 mL (06/02/15 0359)    Time Spent 30 minutes.

## 2015-06-02 NOTE — Telephone Encounter (Signed)
Reviewed [place] Substance Reporting system prior to prescribing opiate medications, no inconsistencies noted.  Meds ordered this encounter  Medications  . methadone (DOLOPHINE) 5 MG tablet    Sig: Take 1 tablet (5 mg total) by mouth at bedtime.    Dispense:  30 tablet    Refill:  0    Order Specific Question:  Supervising Provider    Answer:  Tresa Garter [id number]    Dorena Dew, FNP

## 2015-06-02 NOTE — Telephone Encounter (Signed)
Second attempt to f/u with pt, busy signal, no message left

## 2015-06-02 NOTE — CV Procedure (Signed)
RUE PICC 47 cm SVC RA No comp/EBL

## 2015-06-02 NOTE — Progress Notes (Signed)
Patient has been increasing rate on IVF to 699 cc/h-has been caught doing this twice and patient reports that she does not know how that happened. Have had to hang IVF 1 L bags x3 in <8 hours. Have told patient not to "mess with" or change any settings on pump. Even set lock button on pump but patient unlocked it. Patient has also been pulling dsg up on PIV to look at site and taping it back down. Patient has now lost her IV due to "it coming out". Will page IV team but patient is on list for PICC by IR.

## 2015-06-03 NOTE — Progress Notes (Signed)
SICKLE CELL SERVICE PROGRESS NOTE  [name] [id number] DOB: 05-Feb-1991 DOA: 05/31/2015 PCP: Angelica Chessman, MD  Assessment/Plan: Active Problems:   Hb-SS disease with crisis ([place])  1. Hb SS with crisis: Pt reports pain in RUE and now in B/L thighs. She reports lowest pain score today in her back at 6/10 .The pain in her arm is resolved and the pain in legs is 8/10. She has used only 18 mg with 39/36 on the PCA and 14 mg clinician assisted doses. Will decrease frequency of Clinician assisted doses. Encourage ICS use and mobility. She is unable to receive NSAID's due to her pregnancy. I will  re-assess pain  in the morning.  2. Leukocytosis: Pt has a greater elevation of WBC today. No evidence of infection.  3. Anemia of chronic disease. Hb at baseline. No indications for transfusion. 4. Chronic pain: Continue Methadone 5. Pregnancy: Pt [redacted] wks GA. Plans termination in a few weeks.   Code Status: Full Code Family Communication: N/A Disposition Plan: Not yet ready for discharge  [place].  Pager [phone]. If 7PM-7AM, please contact night-coverage.  06/03/2015, 12:19 PM  LOS: 3 days   Interim History: Pt reports pain resolved in RUE and now in BLE's. She reports lowest pain score today in her back at 6/10. However pain now at 8/10 in BLE's. Lat BM yesterday.  Consultants:  None  Procedures:  None  Antibiotics:  None   Objective: Filed Vitals:   06/03/15 0400 06/03/15 0600 06/03/15 0730 06/03/15 1000  BP:  102/59  95/55  Pulse:  94  89  Temp:  98.6 F (37 C)  98.4 F (36.9 C)  TempSrc:  Oral  Oral  Resp: 18 16 12 15   Height:      Weight:      SpO2: 97% 95% 97% 98%   Weight change:   Intake/Output Summary (Last 24 hours) at 06/03/15 1219 Last data filed at 06/03/15 1001  Gross per 24 hour  Intake 3377.5 ml  Output      0 ml  Net 3377.5 ml    General: Alert, awake, oriented x3, in no acute distress.  HEENT: [place]/AT PEERL, EOMI,  anicteric. Neck: Trachea midline,  no masses, no thyromegal,y no JVD, no carotid bruit OROPHARYNX:  Moist, No exudate/ erythema/lesions.  Heart: Regular rate and rhythm, without murmurs, rubs, gallops, PMI non-displaced, no heaves or thrills on palpation.  Lungs: Clear to auscultation, no wheezing or rhonchi noted. No increased vocal fremitus resonant to percussion  Abdomen: Soft, nontender, nondistended, positive bowel sounds, no masses no hepatosplenomegaly noted.  Neuro: No focal neurological deficits noted cranial nerves II through XII grossly intact.  Strength at functional baseline in bilateral upper and lower extremities. Musculoskeletal: No warmth swelling or erythema around joints, no spinal tenderness noted.     Data Reviewed: Basic Metabolic Panel:  Recent Labs Lab 05/29/15 1126 05/31/15 1038 06/02/15 1105  NA 137 135 139  K 4.4 4.1 3.8  CL 106 106 108  CO2 22 21* 20*  GLUCOSE 84 94 99  BUN 4* 8 5*  CREATININE 0.43* 0.42* 0.37*  CALCIUM 9.1 8.8* 9.3   Liver Function Tests:  Recent Labs Lab 05/29/15 1126 05/31/15 1038  AST 19 27  ALT 20 20  ALKPHOS 48 46  BILITOT 2.1* 2.1*  PROT 6.3 6.8  ALBUMIN 3.7 3.7   No results for input(s): LIPASE, AMYLASE in the last 168 hours. No results for input(s): AMMONIA in the last 168 hours. CBC:  Recent Labs Lab 05/29/15 1126 05/31/15 1038 06/02/15 1105  WBC 15.5* 18.3* 17.7*  NEUTROABS 12.6* 15.2* 14.9*  HGB 8.8* 8.3* 8.2*  HCT 25.9* 24.3* 23.8*  MCV 91.5 90.0 92.2  PLT 478* 453* 445*   Cardiac Enzymes: No results for input(s): CKTOTAL, CKMB, CKMBINDEX, TROPONINI in the last 168 hours. BNP (last 3 results) No results for input(s): BNP in the last 8760 hours.  ProBNP (last 3 results) No results for input(s): PROBNP in the last 8760 hours.  CBG: No results for input(s): GLUCAP in the last 168 hours.  No results found for this or any previous visit (from the past 240 hour(s)).   Studies: Ir Fluoro Guide  Cv Line Right  06/02/2015  CLINICAL DATA:  Sickle cell disease EXAM: RIGHT UPPER EXTREMITY PICC LINE PLACEMENT WITH ULTRASOUND AND FLUOROSCOPIC GUIDANCE FLUOROSCOPY TIME:  36 seconds PROCEDURE: The patient was advised of the possible risks and complications and agreed to undergo the procedure. The patient was then brought to the angiographic suite for the procedure. The right arm was prepped with chlorhexidine, draped in the usual sterile fashion using maximum barrier technique (cap and mask, sterile gown, sterile gloves, large sterile sheet, hand hygiene and cutaneous antisepsis) and infiltrated locally with 1% Lidocaine. Ultrasound demonstrated patency of the right basilic vein, and this was documented with an image. Under real-time ultrasound guidance, this vein was accessed with a 21 gauge micropuncture needle and image documentation was performed. A 0.018 wire was introduced in to the vein. Over this, a 5 [place] double lumen power PICC was advanced to the lower SVC/right atrial junction. Fluoroscopy during the procedure and fluoro spot radiograph confirms appropriate catheter position. The catheter was flushed and covered with a sterile dressing. COMPLICATIONS: None LENGTH: 37 cm IMPRESSION: Successful right arm power PICC line placement with ultrasound and fluoroscopic guidance. The catheter is ready for use. Electronically Signed   By: Marybelle Killings M.D.   On: 06/02/2015 15:21   Ir US Guide Vasc Access Right  06/02/2015  CLINICAL DATA:  Sickle cell disease EXAM: RIGHT UPPER EXTREMITY PICC LINE PLACEMENT WITH ULTRASOUND AND FLUOROSCOPIC GUIDANCE FLUOROSCOPY TIME:  36 seconds PROCEDURE: The patient was advised of the possible risks and complications and agreed to undergo the procedure. The patient was then brought to the angiographic suite for the procedure. The right arm was prepped with chlorhexidine, draped in the usual sterile fashion using maximum barrier technique (cap and mask, sterile gown, sterile  gloves, large sterile sheet, hand hygiene and cutaneous antisepsis) and infiltrated locally with 1% Lidocaine. Ultrasound demonstrated patency of the right basilic vein, and this was documented with an image. Under real-time ultrasound guidance, this vein was accessed with a 21 gauge micropuncture needle and image documentation was performed. A 0.018 wire was introduced in to the vein. Over this, a 5 [place] double lumen power PICC was advanced to the lower SVC/right atrial junction. Fluoroscopy during the procedure and fluoro spot radiograph confirms appropriate catheter position. The catheter was flushed and covered with a sterile dressing. COMPLICATIONS: None LENGTH: 37 cm IMPRESSION: Successful right arm power PICC line placement with ultrasound and fluoroscopic guidance. The catheter is ready for use. Electronically Signed   By: Marybelle Killings M.D.   On: 06/02/2015 15:21    Scheduled Meds: . enoxaparin (LOVENOX) injection  40 mg Subcutaneous Q24H  . folic acid  2 mg Oral Daily  . HYDROmorphone   Intravenous 6 times per day  .  HYDROmorphone (DILAUDID) injection  1 mg Intravenous  Once  . methadone  5 mg Oral QHS  . senna-docusate  1 tablet Oral BID   Continuous Infusions: . dextrose 5 % and 0.45% NaCl 125 mL/hr at 06/03/15 0730    Time Spent 25 minutes.

## 2015-06-04 MED ORDER — OXYCODONE HCL 5 MG PO TABS
15.0000 mg | ORAL_TABLET | ORAL | Status: DC
  Administered 2015-06-04 – 2015-06-05 (×7): 15 mg via ORAL
  Filled 2015-06-04 (×7): qty 3

## 2015-06-04 MED ORDER — DIPHENHYDRAMINE HCL 25 MG PO CAPS
25.0000 mg | ORAL_CAPSULE | ORAL | Status: DC | PRN
  Administered 2015-06-04: 25 mg via ORAL
  Filled 2015-06-04: qty 1

## 2015-06-04 NOTE — Progress Notes (Signed)
SICKLE CELL SERVICE PROGRESS NOTE  [name] [id number] DOB: 1990/10/07 DOA: 05/31/2015 PCP: Angelica Chessman, MD  Assessment/Plan: Active Problems:   Hb-SS disease with crisis ([place])  1. Hb SS with crisis: Pt reports pain resolved in RUE and decreased in B/L thighs. She reports lowest pain score today at 5-6/10 .The pain in her arm is resolved and the pain in legs is 6/10. She has used only 21.39 mg with 45/43 on the PCA and 14 mg clinician assisted doses. Will is continue PCA and schedule Oxycodone. Continue clinician assisted doses on a PRN basis. Encourage ICS use and mobility. She is unable to receive NSAID's due to her pregnancy. Anticipate discharge home tomorrow. 2. Leukocytosis: Pt has a greater elevation of WBC today. No evidence of infection.  3. Anemia of chronic disease. Hb at baseline. No indications for transfusion. 4. Chronic pain: Continue Methadone 5. Pregnancy: Pt [redacted] wks GA. Plans termination in a few weeks.   Code Status: Full Code Family Communication: N/A Disposition Plan: Anticipate discharge home tomorrow.  MATTHEWS,MICHELLE A.  Pager [phone]. If 7PM-7AM, please contact night-coverage.  06/04/2015, 3:23 PM  LOS: 4 days   Interim History: Pt reports pain resolved in RUE and now 5-6/10 in BLE's.  Lat BM yesterday.  Consultants:  None  Procedures:  None  Antibiotics:  None   Objective: Filed Vitals:   06/04/15 0705 06/04/15 1010 06/04/15 1158 06/04/15 1205  BP: 108/55  108/59   Pulse:   97   Temp:   98.3 F (36.8 C)   TempSrc:   Oral   Resp:  16 14 15   Height:      Weight:      SpO2:  99% 99% 99%   Weight change:   Intake/Output Summary (Last 24 hours) at 06/04/15 1523 Last data filed at 06/04/15 1159  Gross per 24 hour  Intake 3750.83 ml  Output   2900 ml  Net 850.83 ml    General: Alert, awake, oriented x3, in no acute distress. Well appearing. HEENT: [place]/AT PEERL, EOMI, anicteric. Neck: Trachea midline,  no masses, no  thyromegal,y no JVD, no carotid bruit OROPHARYNX:  Moist, No exudate/ erythema/lesions.  Heart: Regular rate and rhythm, without murmurs, rubs, gallops, PMI non-displaced, no heaves or thrills on palpation.  Lungs: Clear to auscultation, no wheezing or rhonchi noted. No increased vocal fremitus resonant to percussion  Abdomen: Soft, nontender, nondistended, positive bowel sounds, no masses no hepatosplenomegaly noted.  Neuro: No focal neurological deficits noted cranial nerves II through XII grossly intact.  Strength at functional baseline in bilateral upper and lower extremities. Musculoskeletal: No warmth swelling or erythema around joints, no spinal tenderness noted.     Data Reviewed: Basic Metabolic Panel:  Recent Labs Lab 05/29/15 1126 05/31/15 1038 06/02/15 1105  NA 137 135 139  K 4.4 4.1 3.8  CL 106 106 108  CO2 22 21* 20*  GLUCOSE 84 94 99  BUN 4* 8 5*  CREATININE 0.43* 0.42* 0.37*  CALCIUM 9.1 8.8* 9.3   Liver Function Tests:  Recent Labs Lab 05/29/15 1126 05/31/15 1038  AST 19 27  ALT 20 20  ALKPHOS 48 46  BILITOT 2.1* 2.1*  PROT 6.3 6.8  ALBUMIN 3.7 3.7   No results for input(s): LIPASE, AMYLASE in the last 168 hours. No results for input(s): AMMONIA in the last 168 hours. CBC:  Recent Labs Lab 05/29/15 1126 05/31/15 1038 06/02/15 1105  WBC 15.5* 18.3* 17.7*  NEUTROABS 12.6* 15.2* 14.9*  HGB 8.8*  8.3* 8.2*  HCT 25.9* 24.3* 23.8*  MCV 91.5 90.0 92.2  PLT 478* 453* 445*   Cardiac Enzymes: No results for input(s): CKTOTAL, CKMB, CKMBINDEX, TROPONINI in the last 168 hours. BNP (last 3 results) No results for input(s): BNP in the last 8760 hours.  ProBNP (last 3 results) No results for input(s): PROBNP in the last 8760 hours.  CBG: No results for input(s): GLUCAP in the last 168 hours.  No results found for this or any previous visit (from the past 240 hour(s)).   Studies: Ir Fluoro Guide Cv Line Right  06/02/2015  CLINICAL DATA:  Sickle  cell disease EXAM: RIGHT UPPER EXTREMITY PICC LINE PLACEMENT WITH ULTRASOUND AND FLUOROSCOPIC GUIDANCE FLUOROSCOPY TIME:  36 seconds PROCEDURE: The patient was advised of the possible risks and complications and agreed to undergo the procedure. The patient was then brought to the angiographic suite for the procedure. The right arm was prepped with chlorhexidine, draped in the usual sterile fashion using maximum barrier technique (cap and mask, sterile gown, sterile gloves, large sterile sheet, hand hygiene and cutaneous antisepsis) and infiltrated locally with 1% Lidocaine. Ultrasound demonstrated patency of the right basilic vein, and this was documented with an image. Under real-time ultrasound guidance, this vein was accessed with a 21 gauge micropuncture needle and image documentation was performed. A 0.018 wire was introduced in to the vein. Over this, a 5 [place] double lumen power PICC was advanced to the lower SVC/right atrial junction. Fluoroscopy during the procedure and fluoro spot radiograph confirms appropriate catheter position. The catheter was flushed and covered with a sterile dressing. COMPLICATIONS: None LENGTH: 37 cm IMPRESSION: Successful right arm power PICC line placement with ultrasound and fluoroscopic guidance. The catheter is ready for use. Electronically Signed   By: Marybelle Killings M.D.   On: 06/02/2015 15:21   Ir US Guide Vasc Access Right  06/02/2015  CLINICAL DATA:  Sickle cell disease EXAM: RIGHT UPPER EXTREMITY PICC LINE PLACEMENT WITH ULTRASOUND AND FLUOROSCOPIC GUIDANCE FLUOROSCOPY TIME:  36 seconds PROCEDURE: The patient was advised of the possible risks and complications and agreed to undergo the procedure. The patient was then brought to the angiographic suite for the procedure. The right arm was prepped with chlorhexidine, draped in the usual sterile fashion using maximum barrier technique (cap and mask, sterile gown, sterile gloves, large sterile sheet, hand hygiene and  cutaneous antisepsis) and infiltrated locally with 1% Lidocaine. Ultrasound demonstrated patency of the right basilic vein, and this was documented with an image. Under real-time ultrasound guidance, this vein was accessed with a 21 gauge micropuncture needle and image documentation was performed. A 0.018 wire was introduced in to the vein. Over this, a 5 [place] double lumen power PICC was advanced to the lower SVC/right atrial junction. Fluoroscopy during the procedure and fluoro spot radiograph confirms appropriate catheter position. The catheter was flushed and covered with a sterile dressing. COMPLICATIONS: None LENGTH: 37 cm IMPRESSION: Successful right arm power PICC line placement with ultrasound and fluoroscopic guidance. The catheter is ready for use. Electronically Signed   By: Marybelle Killings M.D.   On: 06/02/2015 15:21    Scheduled Meds: . enoxaparin (LOVENOX) injection  40 mg Subcutaneous Q24H  . folic acid  2 mg Oral Daily  .  HYDROmorphone (DILAUDID) injection  1 mg Intravenous Once  . methadone  5 mg Oral QHS  . oxyCODONE  15 mg Oral Q4H  . senna-docusate  1 tablet Oral BID   Continuous Infusions: . dextrose 5 %  and 0.45% NaCl 125 mL/hr at 06/04/15 1018    Time Spent 25 minutes.

## 2015-06-04 NOTE — Care Management Important Message (Signed)
Important Message  Patient Details  Name: [name] MRN: [id number] Date of Birth: [birth year]   Medicare Important Message Given:  Yes    Camillo Flaming 06/04/2015, 11:02 AMImportant Message  Patient Details  Name: [name] MRN: [id number] Date of Birth: [birth year]   Medicare Important Message Given:  Yes    Camillo Flaming 06/04/2015, 11:02 AM

## 2015-06-04 NOTE — Care Management Note (Signed)
Case Management Note  Patient Details  Name: [name] MRN: [id number] Date of Birth: [birth year]  Subjective/Objective:      25 yo admitted with [facility]              Action/Plan: From home alone  Expected Discharge Date:                  Expected Discharge Plan:  Home/Self Care  In-House Referral:     Discharge planning Services  CM Consult  Post Acute Care Choice:    Choice offered to:     DME Arranged:    DME Agency:     HH Arranged:    [place] Agency:     Status of Service:  In process, will continue to follow  Medicare Important Message Given:  Yes Date Medicare IM Given:    Medicare IM give by:    Date Additional Medicare IM Given:    Additional Medicare Important Message give by:     If discussed at [place] of Stay Meetings, dates discussed:    Additional Comments: Chart reviewed and no CM needs identified or communicated at this time. CM will continue to follow. Marney Doctor RN,BSN,NCM [id number] Lynnell Catalan, RN 06/04/2015, 11:51 AM

## 2015-06-05 NOTE — Progress Notes (Signed)
Pt discharged home in stable condition. Discharge instructions and scripts given. Pt verbalized understanding 

## 2015-06-05 NOTE — Discharge Summary (Signed)
Physician Discharge Summary  Patient ID: [name] MRN: [id number] DOB/AGE: May 08, 1991 25 y.o.  Admit date: 05/31/2015 Discharge date: 06/05/2015  Admission Diagnoses:  Discharge Diagnoses:  Active Problems:   Hb-SS disease with crisis [facility])   Discharged Condition: good  Hospital Course: Patient is a 25 yo female with history of Sickle cell disease who was admitted with acute sickle cell crisis. She is [redacted] weeks pregnant.  Was treated with IV Dilaudid but not Toradol due to pregnancy. She was also treated on hr oral medications. She  reponded to treatment and was gradually taper down to home methadone and oxycodone.  She will be following up with OB/GYN as well as primary care physician.  Consults: None  Significant Diagnostic Studies: labs: Serial CBc and CMPs checked. Patient did much better.  Treatments: IV hydration and analgesia: Dilaudid  Discharge Exam: Blood pressure 96/54, pulse 82, temperature 98.6 F (37 C), temperature source Oral, resp. rate 16, height 5\' 2"  (1.575 m), weight 76.204 kg (168 lb), last menstrual period 02/09/2015, SpO2 96 %, not currently breastfeeding. General appearance: alert, cooperative and no distress Head: Normocephalic, without obvious abnormality, atraumatic Back: symmetric, no curvature. ROM normal. No CVA tenderness. Resp: clear to auscultation bilaterally Chest wall: no tenderness Cardio: regular rate and rhythm, S1, S2 normal, no murmur, click, rub or gallop GI: soft, non-tender; bowel sounds normal; no masses,  no organomegaly Extremities: extremities normal, atraumatic, no cyanosis or edema Pulses: 2+ and symmetric Skin: Skin color, texture, turgor normal. No rashes or lesions Neurologic: Grossly normal  Disposition: 01-Home or Self Care     Medication List    TAKE these medications        acetaminophen 500 MG tablet  Commonly known as:  TYLENOL  Take 1,000 mg by mouth every 6 (six) hours as needed for moderate pain.      aspirin-acetaminophen-caffeine 250-250-65 MG tablet  Commonly known as:  EXCEDRIN MIGRAINE  Take 1 tablet by mouth daily as needed for headache.     ferrous sulfate 325 (65 FE) MG tablet  Take 325 mg by mouth daily with breakfast.     folic acid 1 MG tablet  Commonly known as:  FOLVITE  Take 2 tablets (2 mg total) by mouth daily.     methadone 5 MG tablet  Commonly known as:  DOLOPHINE  Take 1 tablet (5 mg total) by mouth at bedtime.     oxyCODONE 15 MG immediate release tablet  Commonly known as:  ROXICODONE  Take 1 tablet (15 mg total) by mouth every 4 (four) hours as needed for pain.         SignedBarbette Merino 06/05/2015, 4:10 PM  Time spent: 32 minutes

## 2015-06-15 ENCOUNTER — Encounter (HOSPITAL_COMMUNITY): Payer: Self-pay | Admitting: *Deleted

## 2015-06-15 ENCOUNTER — Encounter (HOSPITAL_COMMUNITY): Payer: Self-pay | Admitting: Emergency Medicine

## 2015-06-15 ENCOUNTER — Non-Acute Institutional Stay (HOSPITAL_BASED_OUTPATIENT_CLINIC_OR_DEPARTMENT_OTHER)
Admission: AD | Admit: 2015-06-15 | Discharge: 2015-06-15 | Disposition: A | Discharge status: Home / Self Care | Payer: Medicare Other | Source: Ambulatory Visit | Attending: Internal Medicine | Admitting: Internal Medicine

## 2015-06-15 ENCOUNTER — Emergency Department (HOSPITAL_COMMUNITY)
Admission: EM | Admit: 2015-06-15 | Discharge: 2015-06-15 | Disposition: A | Discharge status: Home / Self Care | Payer: Medicare Other | Source: Home / Self Care | Attending: Emergency Medicine | Admitting: Emergency Medicine

## 2015-06-15 DIAGNOSIS — Z79899 Other long term (current) drug therapy: Secondary | ICD-10-CM

## 2015-06-15 DIAGNOSIS — Z87891 Personal history of nicotine dependence: Secondary | ICD-10-CM | POA: Insufficient documentation

## 2015-06-15 DIAGNOSIS — O99352 Diseases of the nervous system complicating pregnancy, second trimester: Secondary | ICD-10-CM

## 2015-06-15 DIAGNOSIS — O99012 Anemia complicating pregnancy, second trimester: Secondary | ICD-10-CM

## 2015-06-15 DIAGNOSIS — Z3A19 19 weeks gestation of pregnancy: Secondary | ICD-10-CM | POA: Insufficient documentation

## 2015-06-15 DIAGNOSIS — O9989 Other specified diseases and conditions complicating pregnancy, childbirth and the puerperium: Secondary | ICD-10-CM | POA: Diagnosis not present

## 2015-06-15 DIAGNOSIS — G43909 Migraine, unspecified, not intractable, without status migrainosus: Secondary | ICD-10-CM

## 2015-06-15 DIAGNOSIS — Z8719 Personal history of other diseases of the digestive system: Secondary | ICD-10-CM

## 2015-06-15 DIAGNOSIS — Z8659 Personal history of other mental and behavioral disorders: Secondary | ICD-10-CM | POA: Insufficient documentation

## 2015-06-15 DIAGNOSIS — O99412 Diseases of the circulatory system complicating pregnancy, second trimester: Secondary | ICD-10-CM | POA: Insufficient documentation

## 2015-06-15 DIAGNOSIS — Z8679 Personal history of other diseases of the circulatory system: Secondary | ICD-10-CM | POA: Insufficient documentation

## 2015-06-15 DIAGNOSIS — Z79891 Long term (current) use of opiate analgesic: Secondary | ICD-10-CM | POA: Insufficient documentation

## 2015-06-15 DIAGNOSIS — Z3A00 Weeks of gestation of pregnancy not specified: Secondary | ICD-10-CM | POA: Insufficient documentation

## 2015-06-15 DIAGNOSIS — D5700 Hb-SS disease with crisis, unspecified: Secondary | ICD-10-CM

## 2015-06-15 DIAGNOSIS — K219 Gastro-esophageal reflux disease without esophagitis: Secondary | ICD-10-CM | POA: Insufficient documentation

## 2015-06-15 DIAGNOSIS — D57819 Other sickle-cell disorders with crisis, unspecified: Secondary | ICD-10-CM | POA: Diagnosis not present

## 2015-06-15 DIAGNOSIS — G8929 Other chronic pain: Secondary | ICD-10-CM

## 2015-06-15 DIAGNOSIS — O99019 Anemia complicating pregnancy, unspecified trimester: Secondary | ICD-10-CM | POA: Insufficient documentation

## 2015-06-15 DIAGNOSIS — M79602 Pain in left arm: Secondary | ICD-10-CM | POA: Diagnosis not present

## 2015-06-15 DIAGNOSIS — Z91040 Latex allergy status: Secondary | ICD-10-CM | POA: Insufficient documentation

## 2015-06-15 MED ORDER — DIPHENHYDRAMINE HCL 12.5 MG/5ML PO ELIX: 12.5000 mg | ORAL_SOLUTION | Freq: Four times a day (QID) | ORAL | Status: DC | PRN

## 2015-06-15 MED ORDER — SODIUM CHLORIDE 0.9 % IV SOLN
12.5000 mg | Freq: Four times a day (QID) | INTRAVENOUS | Status: DC | PRN
  Administered 2015-06-15: 12.5 mg via INTRAVENOUS
  Filled 2015-06-15 (×3): qty 0.25

## 2015-06-15 MED ORDER — OXYCODONE HCL 5 MG PO TABS: 15.0000 mg | ORAL_TABLET | Freq: Once | ORAL | Status: DC

## 2015-06-15 MED ORDER — SODIUM CHLORIDE 0.9% FLUSH: 9.0000 mL | INTRAVENOUS | Status: DC | PRN

## 2015-06-15 MED ORDER — ONDANSETRON HCL 4 MG/2ML IJ SOLN
4.0000 mg | Freq: Once | INTRAMUSCULAR | Status: AC
  Administered 2015-06-15: 4 mg via INTRAVENOUS
  Filled 2015-06-15: qty 2

## 2015-06-15 MED ORDER — ONDANSETRON HCL 4 MG/2ML IJ SOLN: 4.0000 mg | Freq: Four times a day (QID) | INTRAMUSCULAR | Status: DC | PRN

## 2015-06-15 MED ORDER — HYDROMORPHONE 1 MG/ML IV SOLN
INTRAVENOUS | Status: DC
  Administered 2015-06-15: 11:00:00 via INTRAVENOUS
  Administered 2015-06-15: 5.6 mg via INTRAVENOUS
  Administered 2015-06-15: 9.1 mg via INTRAVENOUS
  Filled 2015-06-15: qty 25

## 2015-06-15 MED ORDER — DEXTROSE-NACL 5-0.45 % IV SOLN
INTRAVENOUS | Status: DC
  Administered 2015-06-15: 11:00:00 via INTRAVENOUS

## 2015-06-15 MED ORDER — HYDROMORPHONE HCL 2 MG/ML IJ SOLN
1.0000 mg | Freq: Once | INTRAMUSCULAR | Status: AC
  Administered 2015-06-15: 1 mg via INTRAVENOUS
  Filled 2015-06-15: qty 1

## 2015-06-15 MED ORDER — OXYCODONE HCL 5 MG PO TABS
10.0000 mg | ORAL_TABLET | Freq: Once | ORAL | Status: AC
  Administered 2015-06-15: 10 mg via ORAL
  Filled 2015-06-15: qty 2

## 2015-06-15 MED ORDER — NALOXONE HCL 0.4 MG/ML IJ SOLN: 0.4000 mg | INTRAMUSCULAR | Status: DC | PRN

## 2015-06-15 NOTE — Progress Notes (Signed)
Pt arrived after being discharged from [facility] ED; transported by wheelchair with IV in place; pt alert, oriented, ambulatory; NP notified of her arrival; will continue to monitor

## 2015-06-15 NOTE — ED Notes (Signed)
Pt states sickle cell pain flared up this morning with pain down her left arm. Denies CP, SOB. States she did feel slightly nauseous this morning from the pain.

## 2015-06-15 NOTE — ED Notes (Signed)
Spoke with nurse at sickle cell clinic, she is coming to pick up patient so that we can keep her IV in place after a very difficult access

## 2015-06-15 NOTE — ED Provider Notes (Signed)
CSN: [id number]     Arrival date & time 06/15/15  0911 History   First MD Initiated Contact with Patient 06/15/15 [phone]     Chief Complaint  Patient presents with  . Sickle Cell Pain Crisis     (Consider location/radiation/quality/duration/timing/severity/associated sxs/prior Treatment) HPI   25 year old female with history of sickle cell anemia, chronic back pain, depression presents with sickle cell related pain. Patient report last night she was feeling fine but this morning she developed pain throughout her left arm in which she described as a sharp throbbing achy sensation persistent nonradiating and felt similar to prior sickle cell crisis. She attributed the pain to weather changes. She denies having fever, chills, productive cough, chest pain, shortness of breath, abdominal pain, nausea vomiting diarrhea. Denies neck pain, arm numbness, weakness or rash.  Patient is a G4 P1, currently [redacted] weeks pregnant. She reported that she had an ultrasound 4 weeks ago that shows an intrauterine pregnancy. She denies having any vaginal bleeding or vaginal discharge at this time. Patient did take her home pain medication this morning, oxycodone, with minimal relief. She did attempt to contact the sickle cell clinic but it was closed, therefore she decided to come here for further management. Patient currently rates her pain as moderate to severe.  Past Medical History  Diagnosis Date  . Miscarriage 03/22/2011    Pt reports 2 miscarriages.  . Depression 01/06/2011  . GERD (gastroesophageal reflux disease) 02/17/2011  . Trichotillomania     h/o  . Blood transfusion     "lots"  . Sickle cell anemia with crisis ([place])   . Exertional dyspnea     "sometimes"  . Migraines 11/08/11    "@ least twice/month"  . Chronic back pain     "very severe; have knot in my back; from tight muscle; take RX and exercise for it"  . Mood swings ([place]) 11/08/11    "I go back and forth; real bad"  . Sickle cell anemia [facility])     Past Surgical History  Procedure Laterality Date  . Cholecystectomy  05/2010  . Dilation and curettage of uterus  02/20/11    S/P miscarriage   Family History  Problem Relation Age of Onset  . Diabetes Maternal Grandmother   . Diabetes Paternal Grandmother   . Hypertension Paternal Grandmother   . Diabetes Maternal Grandfather   . Sickle cell trait Mother   . Sickle cell trait Father    Social History  Substance Use Topics  . Smoking status: Former Smoker -- 0.25 packs/day for 1 years    Types: Cigarettes    Quit date: 03/25/2013  . Smokeless tobacco: Never Used  . Alcohol Use: No     Comment: pt states she quit marijuan in May 2013. Rare ETOH, + cigarettes.  She is enrolled in school   OB History    Gravida Para Term Preterm AB TAB SAB Ectopic Multiple Living   3 1 1  1  1   0 1      Obstetric Comments   Miscarried in October 2012 at about 7 weeks     Review of Systems  All other systems reviewed and are negative.     Allergies  Carrot; Carrot oil; and Latex  Home Medications   Prior to Admission medications   Medication Sig Start Date End Date Taking? Authorizing Provider  acetaminophen (TYLENOL) 500 MG tablet Take 1,000 mg by mouth every 6 (six) hours as needed for moderate pain.    Historical  Provider, MD  aspirin-acetaminophen-caffeine (EXCEDRIN MIGRAINE) [phone] MG tablet Take 1 tablet by mouth daily as needed for headache.    Historical Provider, MD  ferrous sulfate 325 (65 FE) MG tablet Take 325 mg by mouth daily with breakfast.    Historical Provider, MD  folic acid (FOLVITE) 1 MG tablet Take 2 tablets (2 mg total) by mouth daily. 03/26/15   [name], MD  methadone (DOLOPHINE) 5 MG tablet Take 1 tablet (5 mg total) by mouth at bedtime. 06/02/15   Dorena Dew, FNP  oxyCODONE (ROXICODONE) 15 MG immediate release tablet Take 1 tablet (15 mg total) by mouth every 4 (four) hours as needed for pain. 05/29/15   Dorena Dew, FNP   LMP  02/09/2015 Physical Exam  Constitutional: She appears well-developed and well-nourished. No distress.  HENT:  Head: Atraumatic.  Eyes: Conjunctivae are normal.  Neck: Neck supple.  Cardiovascular: Normal rate and regular rhythm.   Pulmonary/Chest: Effort normal and breath sounds normal.  Abdominal: Soft. There is no tenderness.  Musculoskeletal: She exhibits tenderness (left arm: Tenderness throughout arm on palpation without focal point tenderness, overlying skin changes, swelling, or signs of infection. Full range of motion throughout all joints. Radial pulse 2+, normal grip strength.).  Neurological: She is alert.  Skin: No rash noted.  Psychiatric: She has a normal mood and affect.  Nursing note and vitals reviewed.   ED Course  Procedures (including critical care time) Labs Review Labs Reviewed  COMPREHENSIVE METABOLIC PANEL  CBC WITH DIFFERENTIAL/PLATELET  RETICULOCYTES     MDM   Final diagnoses:  Sickle cell pain crisis ([name])    BP 106/71 mmHg  Pulse 96  Temp(Src) 98.4 F (36.9 C) (Oral)  Resp 18  Wt 76.204 kg  SpO2 99%  LMP 02/09/2015   9:47 AM Patient here with left arm pain in which she attributed to sickle cell flare. She appears stable and no signs of neurovascular compromise or signs of infection. Plan to transfer patient to sickle cell clinic for further management.  10:11 AM I have consulted with the Sickle Cell clinic who agrees to see pt for pain control.  Upon discharging pt, our nurse has placed an IV access.  Pt will be transfer over to our sickle cell clinic for further management. Care discussed with DR. Audie Pinto.   Domenic Moras, PA-C 06/15/15 [place], MD 06/17/15 [phone]

## 2015-06-15 NOTE — Discharge Summary (Signed)
[facility] Discharge Summary   Patient ID: [name] MRN: [id number] DOB/AGE: 09-Jun-1990 25 y.o.  Admit date: 06/15/2015 Discharge date: 06/15/2015  Primary Care Physician:  Angelica Chessman, MD  Admission Diagnoses:  Active Problems:   Sickle-cell disease with pain [facility])   Discharge Medications:    Medication List    ASK your doctor about these medications        acetaminophen 500 MG tablet  Commonly known as:  TYLENOL  Take 1,000 mg by mouth every 6 (six) hours as needed for moderate pain.     aspirin-acetaminophen-caffeine 250-250-65 MG tablet  Commonly known as:  EXCEDRIN MIGRAINE  Take 1 tablet by mouth daily as needed for headache.     ferrous sulfate 325 (65 FE) MG tablet  Take 325 mg by mouth daily with breakfast.     folic acid 1 MG tablet  Commonly known as:  FOLVITE  Take 2 tablets (2 mg total) by mouth daily.     methadone 5 MG tablet  Commonly known as:  DOLOPHINE  Take 1 tablet (5 mg total) by mouth at bedtime.     oxyCODONE 15 MG immediate release tablet  Commonly known as:  ROXICODONE  Take 1 tablet (15 mg total) by mouth every 4 (four) hours as needed for pain.         Consults:  None  Significant Diagnostic Studies:  Ir Fluoro Guide Cv Line Right  06/02/2015  CLINICAL DATA:  Sickle cell disease EXAM: RIGHT UPPER EXTREMITY PICC LINE PLACEMENT WITH ULTRASOUND AND FLUOROSCOPIC GUIDANCE FLUOROSCOPY TIME:  36 seconds PROCEDURE: The patient was advised of the possible risks and complications and agreed to undergo the procedure. The patient was then brought to the angiographic suite for the procedure. The right arm was prepped with chlorhexidine, draped in the usual sterile fashion using maximum barrier technique (cap and mask, sterile gown, sterile gloves, large sterile sheet, hand hygiene and cutaneous antisepsis) and infiltrated locally with 1% Lidocaine. Ultrasound demonstrated patency of the right basilic vein, and this  was documented with an image. Under real-time ultrasound guidance, this vein was accessed with a 21 gauge micropuncture needle and image documentation was performed. A 0.018 wire was introduced in to the vein. Over this, a 5 [place] double lumen power PICC was advanced to the lower SVC/right atrial junction. Fluoroscopy during the procedure and fluoro spot radiograph confirms appropriate catheter position. The catheter was flushed and covered with a sterile dressing. COMPLICATIONS: None LENGTH: 37 cm IMPRESSION: Successful right arm power PICC line placement with ultrasound and fluoroscopic guidance. The catheter is ready for use. Electronically Signed   By: Marybelle Killings M.D.   On: 06/02/2015 15:21   Ir US Guide Vasc Access Right  06/02/2015  CLINICAL DATA:  Sickle cell disease EXAM: RIGHT UPPER EXTREMITY PICC LINE PLACEMENT WITH ULTRASOUND AND FLUOROSCOPIC GUIDANCE FLUOROSCOPY TIME:  36 seconds PROCEDURE: The patient was advised of the possible risks and complications and agreed to undergo the procedure. The patient was then brought to the angiographic suite for the procedure. The right arm was prepped with chlorhexidine, draped in the usual sterile fashion using maximum barrier technique (cap and mask, sterile gown, sterile gloves, large sterile sheet, hand hygiene and cutaneous antisepsis) and infiltrated locally with 1% Lidocaine. Ultrasound demonstrated patency of the right basilic vein, and this was documented with an image. Under real-time ultrasound guidance, this vein was accessed with a 21 gauge micropuncture needle and image documentation was performed. A 0.018 wire  was introduced in to the vein. Over this, a 5 [place] double lumen power PICC was advanced to the lower SVC/right atrial junction. Fluoroscopy during the procedure and fluoro spot radiograph confirms appropriate catheter position. The catheter was flushed and covered with a sterile dressing. COMPLICATIONS: None LENGTH: 37 cm IMPRESSION:  Successful right arm power PICC line placement with ultrasound and fluoroscopic guidance. The catheter is ready for use. Electronically Signed   By: Marybelle Killings M.D.   On: 06/02/2015 15:21     [facility] Course: Patient was admitted to the day infusion center for extended observation Reviewed laboratory values, consistent with previous values D4.45 at 125 ml/hr for extended observation.  She was started on  high concentration PCA dilaudid per weight based protocol. She used a total of 14.7 mg with 22 demands and 22 deliveries.  She states that pain intensity is 6/10, which is decreased from discharge. Patient can manage at home on current medication regimen. She was encouraged to follow up in office as scheduled and follow up with obstetrician.  Recommend that patient continue folic acid and opiate medications as previously prescribed.    Physical Exam at Discharge:  BP 120/64 mmHg  Pulse 100  Temp(Src) 97.7 F (36.5 C) (Oral)  Resp 12  Ht 5\' 2"  (1.575 m)  Wt 168 lb (76.204 kg)  BMI 30.72 kg/m2  SpO2 96%  LMP 02/09/2015    General Appearance:    Alert, cooperative, no distress, appears stated age  Head:    Normocephalic, without obvious abnormality, atraumatic  Back:     Symmetric, no curvature, ROM normal, no CVA tenderness  Lungs:     Clear to auscultation bilaterally, respirations unlabored  Chest wall:    No tenderness or deformity  Heart:    Regular rate and rhythm, S1 and S2 normal, no murmur, rub   or gallop  Extremities:   Extremities normal, atraumatic, no cyanosis or edema  Pulses:   2+ and symmetric all extremities  Skin:   Skin color, texture, turgor normal, no rashes or lesions  Lymph nodes:   Cervical, supraclavicular, and axillary nodes normal  Neurologic:   CNII-XII intact. Normal strength, sensation and reflexes      throughout   Disposition at Discharge: 01-Home or Self Care  Discharge Orders:   Condition at Discharge:    Stable  Time spent on Discharge:  15 minutes  Signed: Jarryn Altland M 06/15/2015, 5:07 PM

## 2015-06-15 NOTE — Discharge Instructions (Signed)
Please follow up with the sickle cell clinic for further management of your pain.

## 2015-06-15 NOTE — Discharge Instructions (Signed)
Sickle Cell Anemia, Adult Sickle cell anemia is a condition in which red blood cells have an abnormal "sickle" shape. This abnormal shape shortens the cells' life span, which results in a lower than normal concentration of red blood cells in the blood. The sickle shape also causes the cells to clump together and block free blood flow through the blood vessels. As a result, the tissues and organs of the body do not receive enough oxygen. Sickle cell anemia causes organ damage and pain and increases the risk of infection. CAUSES  Sickle cell anemia is a genetic disorder. Those who receive two copies of the gene have the condition, and those who receive one copy have the trait. RISK FACTORS The sickle cell gene is most common in people whose families originated in [place]. Other areas of the globe where sickle cell trait occurs include the Mediterranean, [place] and [place], the [place], and the [place].  SIGNS AND SYMPTOMS  Pain, especially in the extremities, back, chest, or abdomen (common). The pain may start suddenly or may develop following an illness, especially if there is dehydration. Pain can also occur due to overexertion or exposure to extreme temperature changes.  Frequent severe bacterial infections, especially certain types of pneumonia and meningitis.  Pain and swelling in the hands and feet.  Decreased activity.   Loss of appetite.   Change in behavior.  Headaches.  Seizures.  Shortness of breath or difficulty breathing.  Vision changes.  Skin ulcers. Those with the trait may not have symptoms or they may have mild symptoms.  DIAGNOSIS  Sickle cell anemia is diagnosed with blood tests that demonstrate the genetic trait. It is often diagnosed during the newborn period, due to mandatory testing nationwide. A variety of blood tests, X-rays, CT scans, MRI scans, ultrasounds, and lung function tests may also be done to monitor the condition. TREATMENT  Sickle  cell anemia may be treated with:  Medicines. You may be given pain medicines, antibiotic medicines (to treat and prevent infections) or medicines to increase the production of certain types of hemoglobin.  Fluids.  Oxygen.  Blood transfusions. HOME CARE INSTRUCTIONS   Drink enough fluid to keep your urine clear or pale yellow. Increase your fluid intake in hot weather and during exercise.  Do not smoke. Smoking lowers oxygen levels in the blood.   Only take over-the-counter or prescription medicines for pain, fever, or discomfort as directed by your health care provider.  Take antibiotics as directed by your health care provider. Make sure you finish them it even if you start to feel better.   Take supplements as directed by your health care provider.   Consider wearing a medical alert bracelet. This tells anyone caring for you in an emergency of your condition.   When traveling, keep your medical information, health care provider's names, and the medicines you take with you at all times.   If you develop a fever, do not take medicines to reduce the fever right away. This could cover up a problem that is developing. Notify your health care provider.  Keep all follow-up appointments with your health care provider. Sickle cell anemia requires regular medical care. SEEK MEDICAL CARE IF: You have a fever. SEEK IMMEDIATE MEDICAL CARE IF:   You feel dizzy or faint.   You have new abdominal pain, especially on the left side near the stomach area.   You develop a persistent, often uncomfortable and painful penile erection (priapism). If this is not treated immediately it   will lead to impotence.   You have numbness your arms or legs or you have a hard time moving them.   You have a hard time with speech.   You have a fever or persistent symptoms for more than 2-3 days.   You have a fever and your symptoms suddenly get worse.   You have signs or symptoms of infection.  These include:   Chills.   Abnormal tiredness (lethargy).   Irritability.   Poor eating.   Vomiting.   You develop pain that is not helped with medicine.   You develop shortness of breath.  You have pain in your chest.   You are coughing up pus-like or bloody sputum.   You develop a stiff neck.  Your feet or hands swell or have pain.  Your abdomen appears bloated.  You develop joint pain. MAKE SURE YOU:  Understand these instructions.   This information is not intended to replace advice given to you by your health care provider. Make sure you discuss any questions you have with your health care provider.   Document Released: 08/10/2005 Document Revised: 05/23/2014 Document Reviewed: 12/12/2012 Elsevier Interactive Patient Education 2016 Elsevier Inc.  

## 2015-06-15 NOTE — Progress Notes (Signed)
Patient admitted to the [facility] from the ED with complaints of left arm pain.Marland KitchenMarland Kitchen9/10. Treated with Dilaudid PCA, Dilaudid inj. And oral oxycodone and IV fluids. Pain level down to 7/10 at discharge with patient saying that this level was tolerable for her. Discharge instructions given and pt. understands. Patient alert and oriented at discharge. Discharged to home.

## 2015-06-15 NOTE — H&P (Signed)
[facility] History and Physical   Date: 06/15/2015  Patient name: [name] Medical record number: [id number] Date of birth: November 04, 1990 Age: 25 y.o. Gender: female PCP: Angelica Chessman, MD  Attending physician: Tresa Garter, MD  Chief Complaint: Left upper extremity pain  History of Present Illness: Ms. Arnecia Colone, a 25 year old female with a history of sickle cell anemia, HbSS presents with pain to left upper extremity that is consistent with sickle cell related pain. Patient attributes current pain crisis to changes in weather. She maintains that current pain intensity is 8/10.  She describes pain as constant and throbbing. She last had Methdone 5 mg and Oxycodone 15 mg around 3 am with minimal relief. She is taking all other medications consistently. She is currently [redacted] weeks pregnant and has a follow-up with obstetrician in 2 weeks. She currently denies fatigue, shortness of breath, chest pain, nausea,  Vomiting, or  diarrhea   Meds: Prescriptions prior to admission  Medication Sig Dispense Refill Last Dose  . ferrous sulfate 325 (65 FE) MG tablet Take 325 mg by mouth daily with breakfast.   06/14/2015 at Unknown time  . folic acid (FOLVITE) 1 MG tablet Take 2 tablets (2 mg total) by mouth daily. 60 tablet 2 06/14/2015 at Unknown time  . methadone (DOLOPHINE) 5 MG tablet Take 1 tablet (5 mg total) by mouth at bedtime. 30 tablet 0 06/14/2015 at Unknown time  . oxyCODONE (ROXICODONE) 15 MG immediate release tablet Take 1 tablet (15 mg total) by mouth every 4 (four) hours as needed for pain. 90 tablet 0 06/14/2015 at Unknown time  . acetaminophen (TYLENOL) 500 MG tablet Take 1,000 mg by mouth every 6 (six) hours as needed for moderate pain.   More than a month at Unknown time  . aspirin-acetaminophen-caffeine (EXCEDRIN MIGRAINE) 250-250-65 MG tablet Take 1 tablet by mouth daily as needed for headache.   Unknown at Unknown time    Allergies: Carrot;  Carrot oil; and Latex Past Medical History  Diagnosis Date  . Miscarriage 03/22/2011    Pt reports 2 miscarriages.  . Depression 01/06/2011  . GERD (gastroesophageal reflux disease) 02/17/2011  . Trichotillomania     h/o  . Blood transfusion     "lots"  . Sickle cell anemia with crisis ([place])   . Exertional dyspnea     "sometimes"  . Migraines 11/08/11    "@ least twice/month"  . Chronic back pain     "very severe; have knot in my back; from tight muscle; take RX and exercise for it"  . Mood swings ([place]) 11/08/11    "I go back and forth; real bad"  . Sickle cell anemia [name])    Past Surgical History  Procedure Laterality Date  . Cholecystectomy  05/2010  . Dilation and curettage of uterus  02/20/11    S/P miscarriage   Family History  Problem Relation Age of Onset  . Diabetes Maternal Grandmother   . Diabetes Paternal Grandmother   . Hypertension Paternal Grandmother   . Diabetes Maternal Grandfather   . Sickle cell trait Mother   . Sickle cell trait Father    Social History   Social History  . Marital Status: Single    Spouse Name: N/A  . Number of Children: N/A  . Years of Education: N/A   Occupational History  . Not on file.   Social History Main Topics  . Smoking status: Former Smoker -- 0.25 packs/day for 1 years  Types: Cigarettes    Quit date: 03/25/2013  . Smokeless tobacco: Never Used  . Alcohol Use: No     Comment: pt states she quit marijuan in May 2013. Rare ETOH, + cigarettes.  She is enrolled in school  . Drug Use: No  . Sexual Activity: Yes    Birth Control/ Protection: None   Other Topics Concern  . 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    Review of Systems: Constitutional: negative for fatigue Eyes: negative for icterus Ears, nose, mouth, throat, and face: negative Respiratory: negative for cough and pneumonia Cardiovascular:  negative for chest pain, fatigue and palpitations Gastrointestinal: negative Genitourinary:negative Integument/breast: negative Hematologic/lymphatic: negative Musculoskeletal:positive for myalgias and left arm pain Neurological: negative Behavioral/Psych: negative Endocrine: negative Allergic/Immunologic: negative  Physical Exam:  BP 124/58 mmHg  Pulse 96  Temp(Src) 98.7 F (37.1 C) (Oral)  Resp 16  Ht 5\' 2"  (1.575 m)  Wt 168 lb (76.204 kg)  BMI 30.72 kg/m2  SpO2 100%  LMP 02/09/2015  General Appearance:    Alert, cooperative, mild distress, appears stated age  Head:    Normocephalic, without obvious abnormality, atraumatic  Eyes:    PERRL, conjunctiva/corneas clear, EOM's intact, fundi    benign, both eyes  Ears:    Normal TM's and external ear canals, both ears  Nose:   Nares normal, septum midline, mucosa normal, no drainage    or sinus tenderness  Throat:   Lips, mucosa, and tongue normal; teeth and gums normal  Neck:   Supple, symmetrical, trachea midline, no adenopathy;    thyroid:  no enlargement/tenderness/nodules; no carotid   bruit or JVD  Back:     Symmetric, no curvature, ROM normal, no CVA tenderness  Lungs:     Clear to auscultation bilaterally, respirations unlabored  Chest Wall:    No tenderness or deformity   Heart:    Regular rate and rhythm, S1 and S2 normal, no murmur, rub   or gallop  Abdomen:     Soft, non-tender, bowel sounds active all four quadrants,    no masses, no organomegaly  Extremities:   Extremities normal, atraumatic, no cyanosis or edema. 3/5 muscle strength to left upper extremity. Normal ROM  Pulses:   2+ and symmetric all extremities  Skin:   Skin color, texture, turgor normal, no rashes or lesions  Lymph nodes:   Cervical, supraclavicular, and axillary nodes normal  Neurologic:   CNII-XII intact, normal strength, sensation and reflexes    throughout    Lab results: No results found for this or any previous visit (from the past  24 hour(s)).  Imaging results:  No results found.   Assessment & Plan:  Patient will be admitted to the day infusion center for extended observation  Start IV D5.45 for cellular rehydration at 125/hr  Start Dilaudid PCA High Concentration per weight based protocol.   Patient will be re-evaluated for pain intensity in the context of function and relationship to baseline as care progresses.  If no significant pain relief, will transfer patient to inpatient services for a higher level of care.   Reviewed current laboratory values, consistent with previous results  Hollis,Lachina M 06/15/2015, 11:01 AM

## 2015-06-16 ENCOUNTER — Telehealth: Payer: Self-pay | Admitting: Internal Medicine

## 2015-06-16 ENCOUNTER — Telehealth (HOSPITAL_COMMUNITY): Payer: Self-pay | Admitting: Hematology

## 2015-06-16 DIAGNOSIS — D571 Sickle-cell disease without crisis: Secondary | ICD-10-CM

## 2015-06-16 MED ORDER — OXYCODONE HCL 15 MG PO TABS: 15.0000 mg | ORAL_TABLET | ORAL | Status: DC | PRN

## 2015-06-16 NOTE — Telephone Encounter (Signed)
Patient called to advise that she was following the orders she recvd yesterday from the NP.  She has been keeping heat on her hand and is now soaking it in epsom salt.  Patient states she didn't have child care but was wanting to come in to [facility] for continued pain tomorrow.  I spoke with [place], NP and advised patient to call in the morning to be triaged for treatment.  Patient verbalizes understanding.

## 2015-06-16 NOTE — Telephone Encounter (Signed)
Reviewed [place] Substance Reporting system prior to prescribing opiate medications.   Meds ordered this encounter  Medications  . oxyCODONE (ROXICODONE) 15 MG immediate release tablet    Sig: Take 1 tablet (15 mg total) by mouth every 4 (four) hours as needed for pain.    Dispense:  90 tablet    Refill:  0    Rx not to be filled prior to 06/22/2015    Order Specific Question:  Supervising Provider    Answer:  Tresa Garter [id number]     Dorena Dew, FNP

## 2015-06-16 NOTE — Telephone Encounter (Signed)
Refill request for oxycodone 15mg  LOV.05/29/2015. Please advise.Thanks!

## 2015-06-17 ENCOUNTER — Telehealth (HOSPITAL_COMMUNITY): Payer: Self-pay | Admitting: *Deleted

## 2015-06-17 ENCOUNTER — Emergency Department (HOSPITAL_COMMUNITY)
Admission: EM | Admit: 2015-06-17 | Discharge: 2015-06-17 | Disposition: A | Discharge status: Home / Self Care | Payer: Medicare Other | Attending: Physician Assistant | Admitting: Physician Assistant

## 2015-06-17 ENCOUNTER — Encounter (HOSPITAL_COMMUNITY): Payer: Self-pay | Admitting: Emergency Medicine

## 2015-06-17 DIAGNOSIS — O99019 Anemia complicating pregnancy, unspecified trimester: Secondary | ICD-10-CM | POA: Diagnosis not present

## 2015-06-17 DIAGNOSIS — M79602 Pain in left arm: Secondary | ICD-10-CM

## 2015-06-17 DIAGNOSIS — D5700 Hb-SS disease with crisis, unspecified: Secondary | ICD-10-CM

## 2015-06-17 LAB — COMPREHENSIVE METABOLIC PANEL
ALBUMIN: 3.5 g/dL (ref 3.5–5.0)
ALK PHOS: 64 U/L (ref 38–126)
ALT: 20 U/L (ref 14–54)
AST: 21 U/L (ref 15–41)
Anion gap: 9 (ref 5–15)
BILIRUBIN TOTAL: 1.8 mg/dL — AB (ref 0.3–1.2)
CALCIUM: 8.6 mg/dL — AB (ref 8.9–10.3)
CO2: 21 mmol/L — ABNORMAL LOW (ref 22–32)
Chloride: 107 mmol/L (ref 101–111)
Creatinine, Ser: 0.41 mg/dL — ABNORMAL LOW (ref 0.44–1.00)
GFR calc Af Amer: >60 mL/min (ref >60)
GFR calc non Af Amer: >60 mL/min (ref >60)
GLUCOSE: 104 mg/dL — AB (ref 65–99)
POTASSIUM: 3.6 mmol/L (ref 3.5–5.1)
Sodium: 137 mmol/L (ref 135–145)
TOTAL PROTEIN: 6.7 g/dL (ref 6.5–8.1)

## 2015-06-17 LAB — CBC WITH DIFFERENTIAL/PLATELET
BASOS ABS: 0 10*3/uL (ref 0.0–0.1)
Basophils Relative: 0 %
EOS PCT: 1 %
Eosinophils Absolute: 0.2 10*3/uL (ref 0.0–0.7)
HCT: 26.3 % — ABNORMAL LOW (ref 36.0–46.0)
Hemoglobin: 8.9 g/dL — ABNORMAL LOW (ref 12.0–15.0)
LYMPHS PCT: 10 %
Lymphs Abs: 1.5 10*3/uL (ref 0.7–4.0)
MCH: 31.7 pg (ref 26.0–34.0)
MCHC: 33.8 g/dL (ref 30.0–36.0)
MCV: 93.6 fL (ref 78.0–100.0)
MONO ABS: 1.1 10*3/uL — AB (ref 0.1–1.0)
Monocytes Relative: 7 %
Neutro Abs: 13.2 10*3/uL — ABNORMAL HIGH (ref 1.7–7.7)
Neutrophils Relative %: 82 %
PLATELETS: 549 10*3/uL — AB (ref 150–400)
RBC: 2.81 MIL/uL — ABNORMAL LOW (ref 3.87–5.11)
RDW: 17.9 % — AB (ref 11.5–15.5)
WBC: 16.1 10*3/uL — ABNORMAL HIGH (ref 4.0–10.5)

## 2015-06-17 MED ORDER — SODIUM CHLORIDE 0.9 % IV BOLUS (SEPSIS): 1000.0000 mL | Freq: Once | INTRAVENOUS | Status: DC

## 2015-06-17 MED ORDER — DEXTROSE-NACL 5-0.45 % IV SOLN
INTRAVENOUS | Status: DC
  Administered 2015-06-17: 15:00:00 via INTRAVENOUS

## 2015-06-17 MED ORDER — DIPHENHYDRAMINE HCL 50 MG/ML IJ SOLN
25.0000 mg | Freq: Once | INTRAMUSCULAR | Status: AC
  Administered 2015-06-17: 25 mg via INTRAVENOUS

## 2015-06-17 MED ORDER — DIPHENHYDRAMINE HCL 25 MG PO CAPS
ORAL_CAPSULE | ORAL | Status: AC
  Filled 2015-06-17: qty 1

## 2015-06-17 MED ORDER — HYDROMORPHONE HCL 2 MG/ML IJ SOLN
0.0250 mg/kg | INTRAMUSCULAR | Status: AC
  Administered 2015-06-17: 1.9 mg via INTRAVENOUS
  Filled 2015-06-17: qty 1

## 2015-06-17 MED ORDER — DIPHENHYDRAMINE HCL 25 MG PO CAPS: 25.0000 mg | ORAL_CAPSULE | Freq: Once | ORAL | Status: DC

## 2015-06-17 MED ORDER — HYDROMORPHONE HCL 2 MG/ML IJ SOLN
0.0313 mg/kg | INTRAMUSCULAR | Status: AC
  Administered 2015-06-17: 2.4 mg via INTRAVENOUS
  Filled 2015-06-17: qty 2

## 2015-06-17 MED ORDER — DIPHENHYDRAMINE HCL 50 MG/ML IJ SOLN
INTRAMUSCULAR | Status: AC
  Filled 2015-06-17: qty 1

## 2015-06-17 MED ORDER — HYDROMORPHONE HCL 2 MG/ML IJ SOLN: 0.0250 mg/kg | INTRAMUSCULAR | Status: AC

## 2015-06-17 MED ORDER — ONDANSETRON HCL 4 MG/2ML IJ SOLN
4.0000 mg | INTRAMUSCULAR | Status: DC | PRN
  Administered 2015-06-17: 4 mg via INTRAVENOUS
  Filled 2015-06-17: qty 2

## 2015-06-17 MED ORDER — HYDROMORPHONE HCL 2 MG/ML IJ SOLN: 0.0313 mg/kg | INTRAMUSCULAR | Status: AC

## 2015-06-17 MED ORDER — HYDROMORPHONE HCL 2 MG/ML IJ SOLN
2.0000 mg | Freq: Once | INTRAMUSCULAR | Status: AC
  Administered 2015-06-17: 2 mg via INTRAVENOUS
  Filled 2015-06-17: qty 1

## 2015-06-17 MED ORDER — DIPHENHYDRAMINE HCL 50 MG/ML IJ SOLN
25.0000 mg | Freq: Once | INTRAMUSCULAR | Status: AC
  Administered 2015-06-17: 25 mg via INTRAVENOUS
  Filled 2015-06-17: qty 1

## 2015-06-17 NOTE — ED Notes (Signed)
Per pt, states left arm pain since last Sunday-was seen at clinic yesterday-told to come here if it continues

## 2015-06-17 NOTE — Telephone Encounter (Signed)
[facility] with complaints of left hand pain 9/10. Denies fever, chest pain, n/v, diarrhea and abdominal pain. Took oxycodone 15mg  at 5a without relief. Informed patient that I would let the medical provider know and give her a call back.

## 2015-06-17 NOTE — ED Provider Notes (Signed)
CSN: [id number]     Arrival date & time 06/17/15  1017 History   First MD Initiated Contact with Patient 06/17/15 1226     Chief Complaint  Patient presents with  . Sickle Cell Pain Crisis     (Consider location/radiation/quality/duration/timing/severity/associated sxs/prior Treatment) HPI   Nancy D Suddreth is a 25 y.o. female with PMH significant for depression, GERD, migraines, chronic back pain, and sickle cell anemia who presents with sickle cell pain crisis.  Patient was seen 06/15/15 for left arm pain and treated at the sickle cell clinic.  She has been taking 15 mg oxycodone as prescribed, soaking her arm in epsom salt, and resting however her pain has continued.  She called the sickle cell clinic this morning, and they told her to come to the ED for further management.  She describes the pain as intermittent, severe, and throbbing.  Denies injury/trauma.  Denies CP, SOB, back pain, abdominal pain, N/V, vaginal bleeding/pain.  Endorses tingling and weakness secondary to pain. She has been compliant with her medications.   Past Medical History  Diagnosis Date  . Miscarriage 03/22/2011    Pt reports 2 miscarriages.  . Depression 01/06/2011  . GERD (gastroesophageal reflux disease) 02/17/2011  . Trichotillomania     h/o  . Blood transfusion     "lots"  . Sickle cell anemia with crisis ([place])   . Exertional dyspnea     "sometimes"  . Migraines 11/08/11    "@ least twice/month"  . Chronic back pain     "very severe; have knot in my back; from tight muscle; take RX and exercise for it"  . Mood swings ([place]) 11/08/11    "I go back and forth; real bad"  . Sickle cell anemia [facility])    Past Surgical History  Procedure Laterality Date  . Cholecystectomy  05/2010  . Dilation and curettage of uterus  02/20/11    S/P miscarriage   Family History  Problem Relation Age of Onset  . Diabetes Maternal Grandmother   . Diabetes Paternal Grandmother   . Hypertension Paternal Grandmother   .  Diabetes Maternal Grandfather   . Sickle cell trait Mother   . Sickle cell trait Father    Social History  Substance Use Topics  . Smoking status: Former Smoker -- 0.25 packs/day for 1 years    Types: Cigarettes    Quit date: 03/25/2013  . Smokeless tobacco: Never Used  . Alcohol Use: No     Comment: pt states she quit marijuan in May 2013. Rare ETOH, + cigarettes.  She is enrolled in school   OB History    Gravida Para Term Preterm AB TAB SAB Ectopic Multiple Living   3 1 1  1  1   0 1      Obstetric Comments   Miscarried in October 2012 at about 7 weeks     Review of Systems All other systems negative unless otherwise stated in HPI    Allergies  Carrot; Carrot oil; and Latex  Home Medications   Prior to Admission medications   Medication Sig Start Date End Date Taking? Authorizing Provider  acetaminophen (TYLENOL) 500 MG tablet Take 1,000 mg by mouth every 6 (six) hours as needed for moderate pain.   Yes Historical Provider, MD  folic acid (FOLVITE) 1 MG tablet Take 2 tablets (2 mg total) by mouth daily. Patient taking differently: Take 1 mg by mouth daily.  03/26/15  Yes [name], MD  methadone (DOLOPHINE) 5 MG  tablet Take 1 tablet (5 mg total) by mouth at bedtime. 06/02/15  Yes Dorena Dew, FNP  oxyCODONE (ROXICODONE) 15 MG immediate release tablet Take 1 tablet (15 mg total) by mouth every 4 (four) hours as needed for pain. 06/16/15  Yes Dorena Dew, FNP  Prenatal Vit-Fe Fumarate-FA (PRENATAL MULTIVITAMIN) TABS tablet Take 2 tablets by mouth daily at 12 noon.   Yes Historical Provider, MD   BP 99/64 mmHg  Pulse 96  Temp(Src) 98.2 F (36.8 C) (Oral)  Resp 18  SpO2 100%  LMP 02/09/2015 Physical Exam  Constitutional: She is oriented to person, place, and time. She appears well-developed and well-nourished.  Non-toxic appearance. She does not have a sickly appearance. She does not appear ill.  HENT:  Head: Normocephalic and atraumatic.   Mouth/Throat: Oropharynx is clear and moist.  Eyes: Conjunctivae are normal. Pupils are equal, round, and reactive to light.  Neck: Normal range of motion. Neck supple.  Cardiovascular: Normal rate, regular rhythm and normal heart sounds.   No murmur heard. Pulses:      Radial pulses are 2+ on the right side, and 2+ on the left side.  Pulmonary/Chest: Effort normal and breath sounds normal. No accessory muscle usage or stridor. No respiratory distress. She has no wheezes. She has no rhonchi. She has no rales.  Abdominal: Soft. Bowel sounds are normal. She exhibits no distension. There is no tenderness.  Musculoskeletal: Normal range of motion. She exhibits tenderness.  Diffuse tenderness throughout left arm distal to elbow.  No obvious deformity, ecchymosis, erythema, or swelling.  Decreased ROM secondary to pain.   Lymphadenopathy:    She has no cervical adenopathy.  Neurological: She is alert and oriented to person, place, and time.  Speech clear without dysarthria.  Decreased strength secondary to pain.  Sensation intact.   Skin: Skin is warm and dry.  No erythema, warmth, swelling, or induration.  Psychiatric: She has a normal mood and affect. Her behavior is normal.    ED Course  Procedures (including critical care time) Labs Review Labs Reviewed  COMPREHENSIVE METABOLIC PANEL - Abnormal; Notable for the following:    CO2 21 (*)    Glucose, Bld 104 (*)    BUN <5 (*)    Creatinine, Ser 0.41 (*)    Calcium 8.6 (*)    Total Bilirubin 1.8 (*)    All other components within normal limits  CBC WITH DIFFERENTIAL/PLATELET - Abnormal; Notable for the following:    WBC 16.1 (*)    RBC 2.81 (*)    Hemoglobin 8.9 (*)    HCT 26.3 (*)    RDW 17.9 (*)    Platelets 549 (*)    Neutro Abs 13.2 (*)    Monocytes Absolute 1.1 (*)    All other components within normal limits    Imaging Review No results found. I have personally reviewed and evaluated these images and lab results as  part of my medical decision-making.   EKG Interpretation None      MDM   Final diagnoses:  Sickle cell pain crisis [facility])    Patient presents with her typical sickle cell pain crisis.  VSS, NAD.  No chest pain, abdominal pain, or hypoxia. Labs unremarkable.  CBC remarkable for WBC 16.1 (baseline), RBC 2.8, hgb 8.9.  On exam, left arm distal to elbow diffusely tender.  NVI.  No swelling, erythema, or signs of infection.  Patient given IV medications and fluids in ED.  Patient's pain improved after first  dose. Plan to discharge home with sickle cell clinic follow up pending IV medications per sickle cell pain crisis protocol.  Patient care hand off to [facility], PA-C at shift change who will order pending pain medication and appropriate disposition.  Case has been discussed with Dr. Thomasene Lot who agrees with the above plan.      Gloriann Loan, PA-C 06/17/15 1640  Courteney Julio Alm, MD 06/20/15 2337

## 2015-06-17 NOTE — ED Notes (Signed)
Pt refusing PO benadryl and dilaudid IM. Unable to get IV. PA made aware.

## 2015-06-17 NOTE — ED Notes (Signed)
Attempted x 1 to initiate IV access with 24 g.  IVT called & stated they were not able to gain access on this pt.

## 2015-06-17 NOTE — Discharge Instructions (Signed)
Sickle Cell Anemia, Adult °Follow up with sickle cell clinic tomorrow.  °Sickle cell anemia is a condition in which red blood cells have an abnormal "sickle" shape. This abnormal shape shortens the cells' life span, which results in a lower than normal concentration of red blood cells in the blood. The sickle shape also causes the cells to clump together and block free blood flow through the blood vessels. As a result, the tissues and organs of the body do not receive enough oxygen. Sickle cell anemia causes organ damage and pain and increases the risk of infection. °CAUSES  °Sickle cell anemia is a genetic disorder. Those who receive two copies of the gene have the condition, and those who receive one copy have the trait. °RISK FACTORS °The sickle cell gene is most common in people whose families originated in [place]. Other areas of the globe where sickle cell trait occurs include the Mediterranean, [place] and [place], the [place], and the [place].  °SIGNS AND SYMPTOMS °· Pain, especially in the extremities, back, chest, or abdomen (common). The pain may start suddenly or may develop following an illness, especially if there is dehydration. Pain can also occur due to overexertion or exposure to extreme temperature changes. °· Frequent severe bacterial infections, especially certain types of pneumonia and meningitis. °· Pain and swelling in the hands and feet. °· Decreased activity.   °· Loss of appetite.   °· Change in behavior. °· Headaches. °· Seizures. °· Shortness of breath or difficulty breathing. °· Vision changes. °· Skin ulcers. °Those with the trait may not have symptoms or they may have mild symptoms.  °DIAGNOSIS  °Sickle cell anemia is diagnosed with blood tests that demonstrate the genetic trait. It is often diagnosed during the newborn period, due to mandatory testing nationwide. A variety of blood tests, X-rays, CT scans, MRI scans, ultrasounds, and lung function tests may also be done to  monitor the condition. °TREATMENT  °Sickle cell anemia may be treated with: °· Medicines. You may be given pain medicines, antibiotic medicines (to treat and prevent infections) or medicines to increase the production of certain types of hemoglobin. °· Fluids. °· Oxygen. °· Blood transfusions. °HOME CARE INSTRUCTIONS  °· Drink enough fluid to keep your urine clear or pale yellow. Increase your fluid intake in hot weather and during exercise. °· Do not smoke. Smoking lowers oxygen levels in the blood.   °· Only take over-the-counter or prescription medicines for pain, fever, or discomfort as directed by your health care provider. °· Take antibiotics as directed by your health care provider. Make sure you finish them it even if you start to feel better.   °· Take supplements as directed by your health care provider.   °· Consider wearing a medical alert bracelet. This tells anyone caring for you in an emergency of your condition.   °· When traveling, keep your medical information, health care provider's names, and the medicines you take with you at all times.   °· If you develop a fever, do not take medicines to reduce the fever right away. This could cover up a problem that is developing. Notify your health care provider. °· Keep all follow-up appointments with your health care provider. Sickle cell anemia requires regular medical care. °SEEK MEDICAL CARE IF: ° You have a fever. °SEEK IMMEDIATE MEDICAL CARE IF:  °· You feel dizzy or faint.   °· You have new abdominal pain, especially on the left side near the stomach area.   °· You develop a persistent, often uncomfortable and painful penile erection (  priapism). If this is not treated immediately it will lead to impotence.   °· You have numbness your arms or legs or you have a hard time moving them.   °· You have a hard time with speech.   °· You have a fever or persistent symptoms for more than 2-3 days.   °· You have a fever and your symptoms suddenly get worse.    °· You have signs or symptoms of infection. These include:   °¨ Chills.   °¨ Abnormal tiredness (lethargy).   °¨ Irritability.   °¨ Poor eating.   °¨ Vomiting.   °· You develop pain that is not helped with medicine.   °· You develop shortness of breath. °· You have pain in your chest.   °· You are coughing up pus-like or bloody sputum.   °· You develop a stiff neck. °· Your feet or hands swell or have pain. °· Your abdomen appears bloated. °· You develop joint pain. °MAKE SURE YOU: °· Understand these instructions. °  °This information is not intended to replace advice given to you by your health care provider. Make sure you discuss any questions you have with your health care provider. °  °Document Released: 08/10/2005 Document Revised: 05/23/2014 Document Reviewed: 12/12/2012 °Elsevier Interactive Patient Education ©2016 [employer] ° °

## 2015-06-17 NOTE — ED Provider Notes (Signed)
  Physical Exam  BP 103/70 mmHg  Pulse 98  Temp(Src) 98.2 F (36.8 C) (Oral)  Resp 16  SpO2 94%  LMP 02/09/2015  Physical Exam  Lungs: No respiratory distress. Abdomen: Gravid uterus Musculoskeletal: Left arm is mildly swollen but could be from blown IV. No erythema or ecchymosis. No streaking.  ED Course  Procedures  Patient with history of sickle cell anemia who is currently pregnant presents for left arm pain and generalized body pain. Pain is at the IV site where the IV blew 2 days ago. She was seen on 1/30 and sent to the sickle cell clinic to be treated with by mouth oxycodone. She went to the sickle cell clinic today and stated that the oral oxycodone has not been working. The sickle cell clinic sent the patient to the ED.  Patient was signed out to me by Gloriann Loan, PA-C. Patient received one dose of 2 mg of Dilaudid prior to me seeing her. A second dose of been ordered but her IV blew. IV team was called. Several attempts were made at patient's discretion. She was given IV fluids and Benadryl.  2 additional doses were given after the first dose and patient stated she felt much better.  Medications  dextrose 5 %-0.45 % sodium chloride infusion ( Intravenous Stopped 06/17/15 1951)  ondansetron (ZOFRAN) injection 4 mg (4 mg Intravenous Given 06/17/15 1437)  sodium chloride 0.9 % bolus 1,000 mL ( Intravenous Canceled Entry 06/17/15 1900)  HYDROmorphone (DILAUDID) injection 1.9 mg (1.9 mg Intravenous Given 06/17/15 1437)    Or  HYDROmorphone (DILAUDID) injection 1.9 mg ( Subcutaneous See Alternative 06/17/15 1437)  diphenhydrAMINE (BENADRYL) injection 25 mg (25 mg Intravenous Given 06/17/15 1436)  HYDROmorphone (DILAUDID) injection 2.4 mg (2.4 mg Intravenous Given 06/17/15 1619)    Or  HYDROmorphone (DILAUDID) injection 2.4 mg ( Subcutaneous See Alternative 06/17/15 1619)  diphenhydrAMINE (BENADRYL) injection 25 mg (25 mg Intravenous Given 06/17/15 1749)  HYDROmorphone (DILAUDID) injection 2 mg  (2 mg Intravenous Given 06/17/15 1858)   Filed Vitals:   06/17/15 1751 06/17/15 1945  BP: 105/67 103/70  Pulse: 88 98  Temp:    Resp: 16 [address], PA-C 06/17/15 2105  Carmin Muskrat, MD 06/17/15 2334

## 2015-06-17 NOTE — Telephone Encounter (Signed)
Spoke with L. Hollis NP about patient's complaints. Advised to have patient go to the ED due to continued high intensity of pain. Otherwise continue taking pain medication as prescribed and continue following discharge instructions from recent treatment. Called and spoke with patient and discussed instructions and patient voiced understanding.

## 2015-06-19 MED FILL — oxyCODONE HCL 15 MG TABS: 15 | 15 days supply | Qty: 90 | Fill #0

## 2015-06-24 IMAGING — CR DG CHEST 2V
2 series · 2 of 2 positions shown · non-contrast
Comparison: 04/15/2014

CLINICAL DATA: Fever, 21 weeks pregnant

EXAM:
CHEST  2 VIEW

[w chest pa]
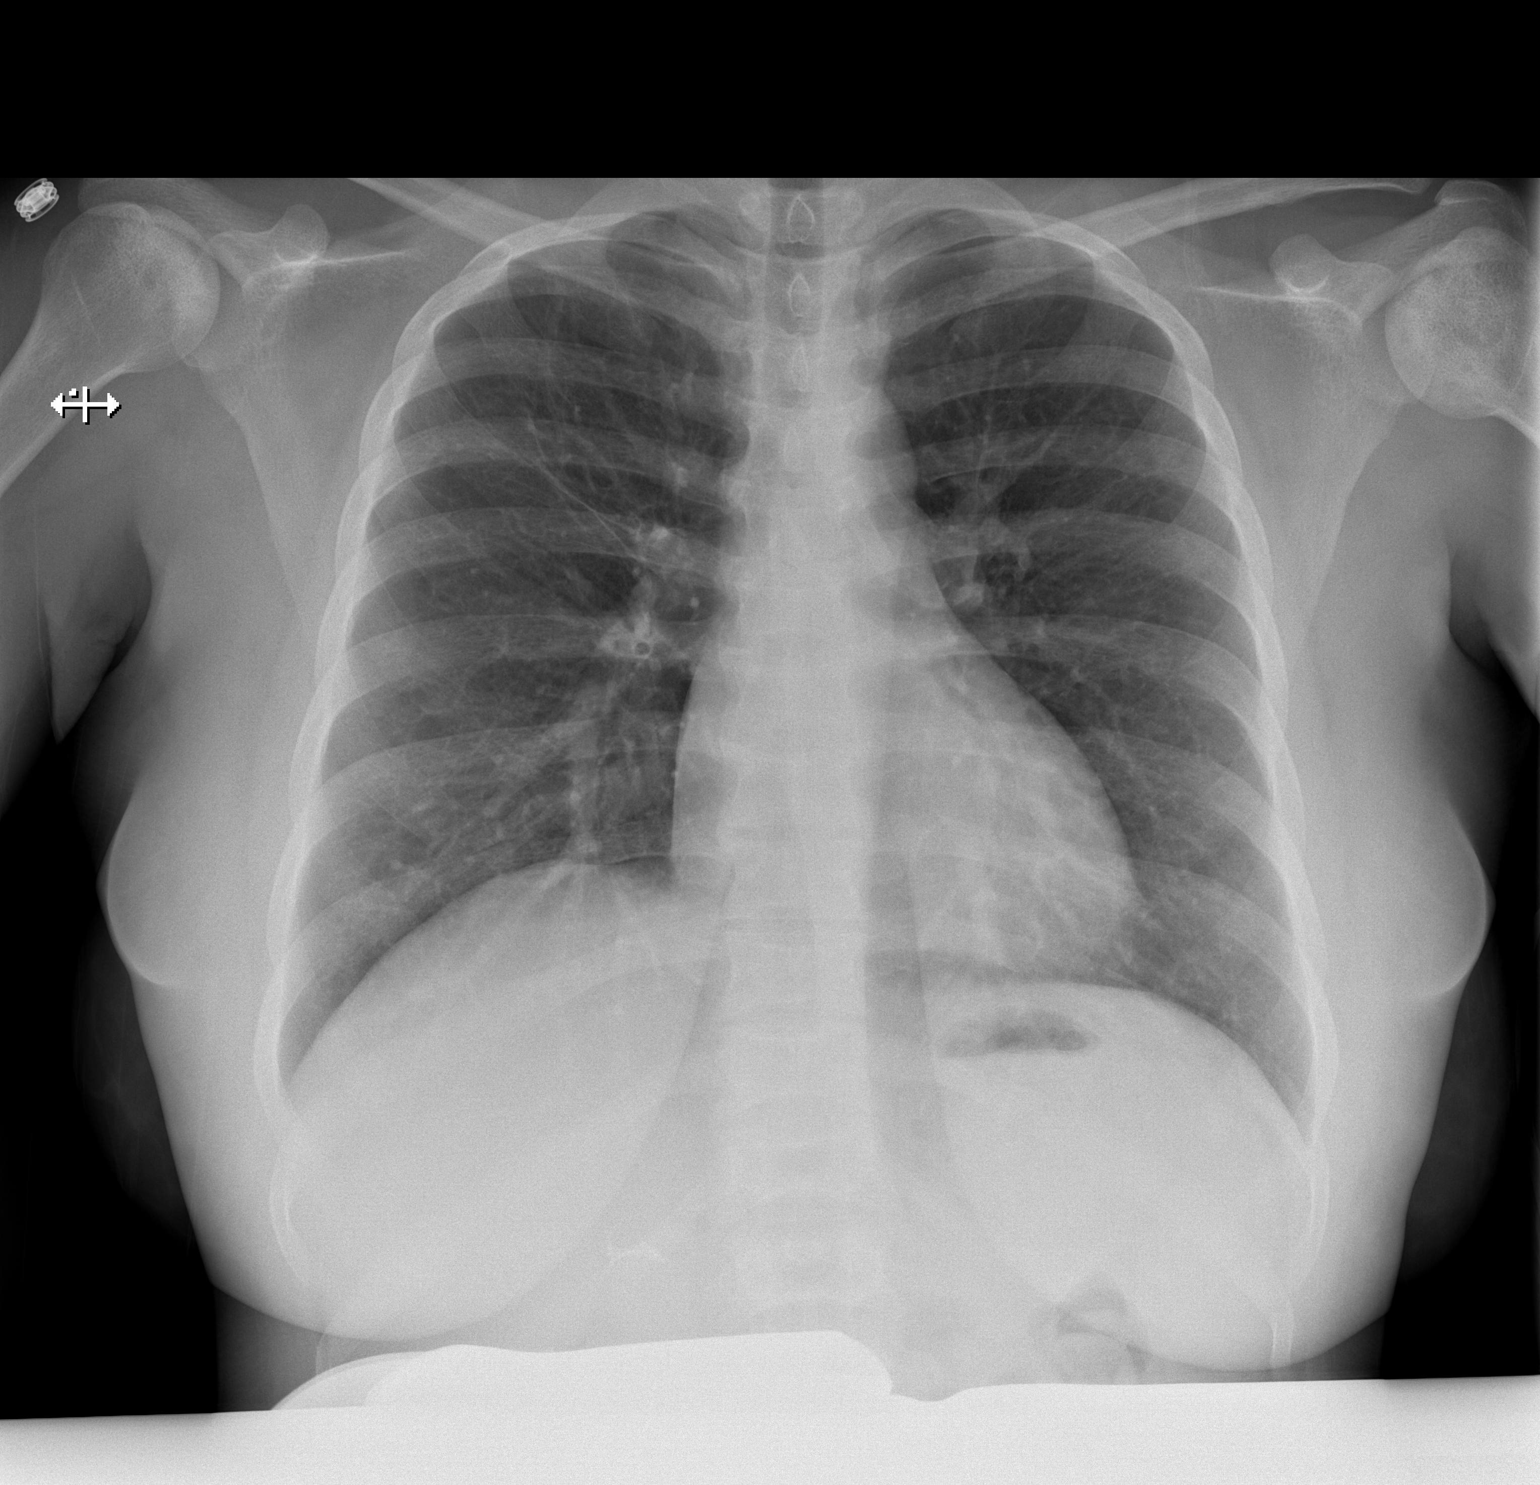

[w chest lat]
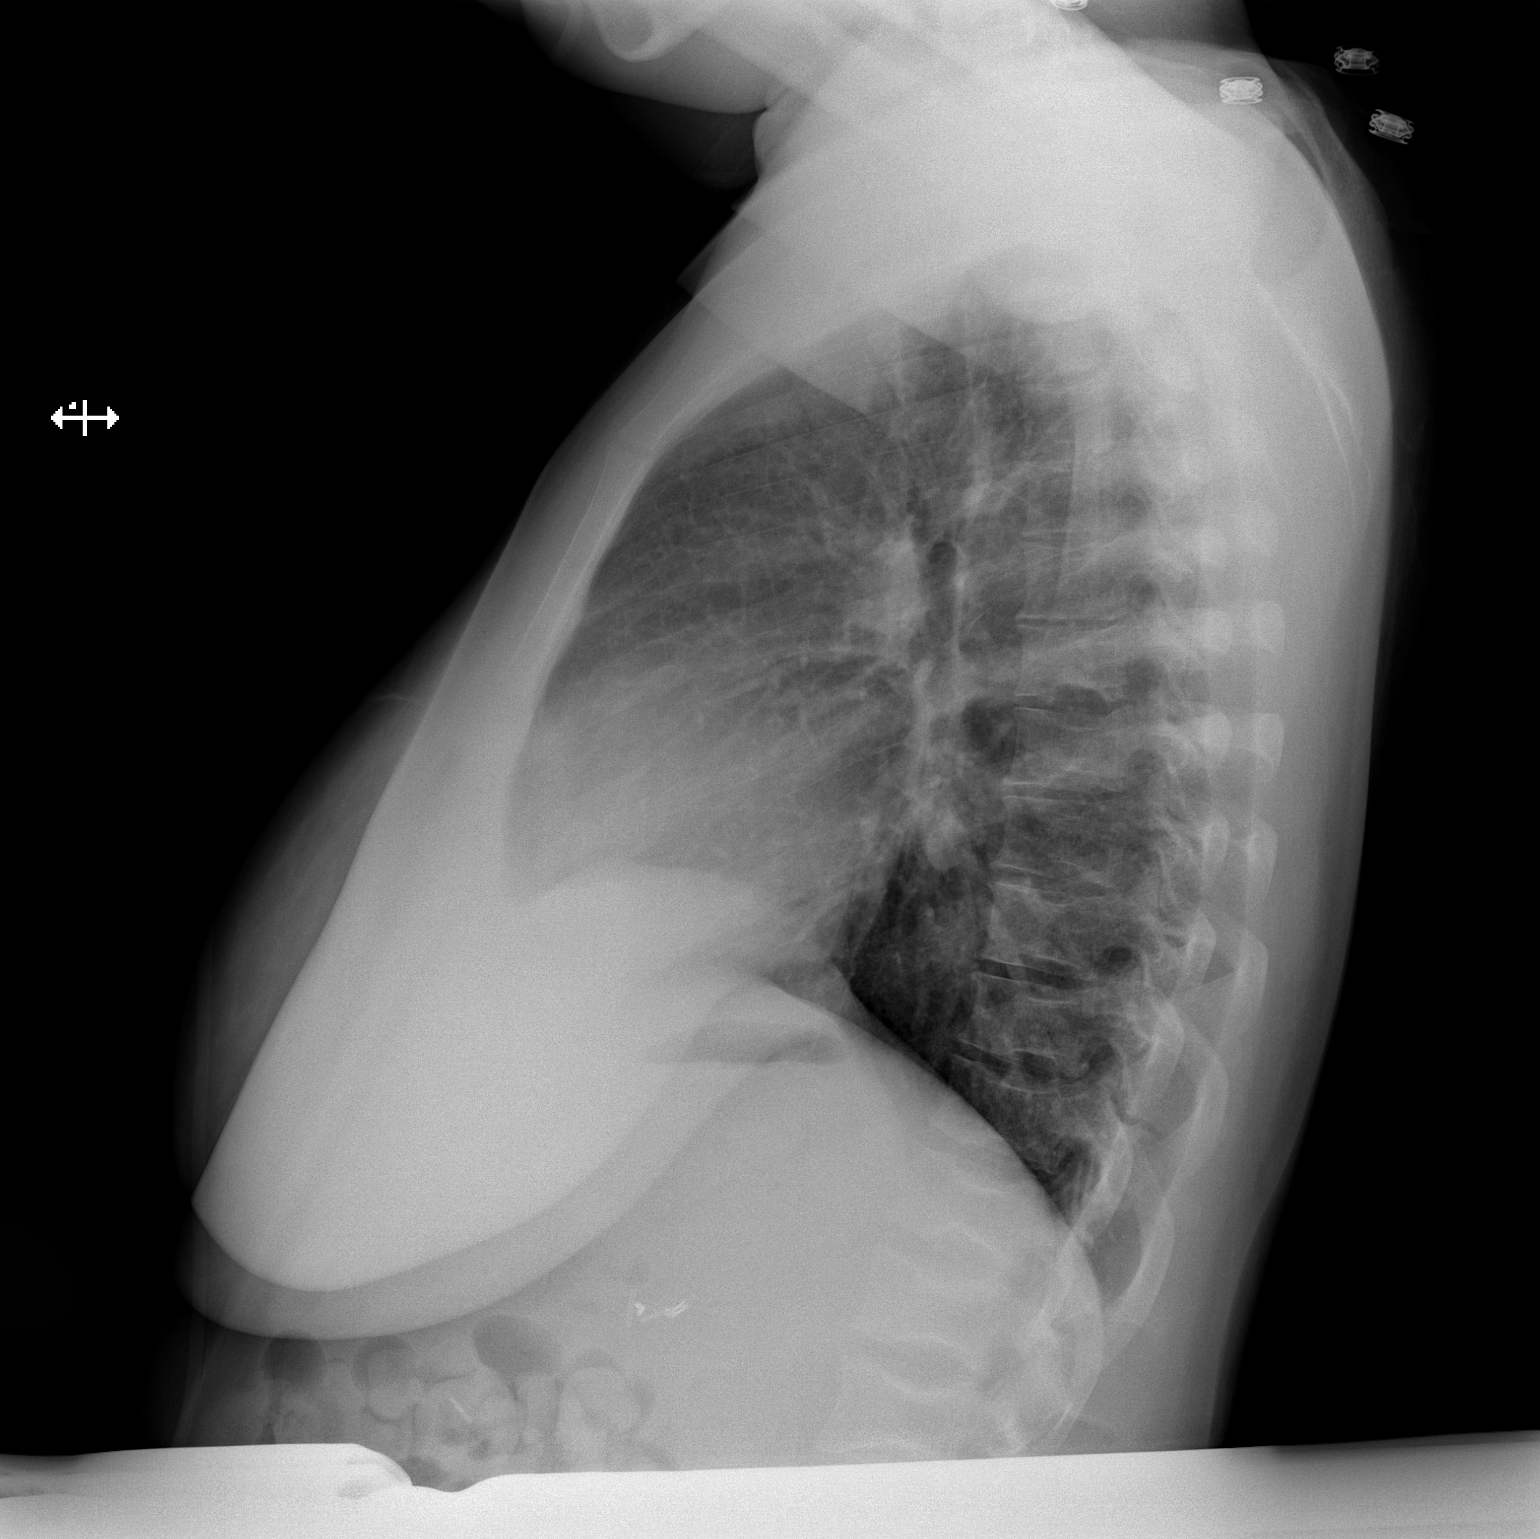

[2 of 2 positions shown; findings below may reference images not displayed]

FINDINGS: The heart size and mediastinal contours are within normal limits.
Both lungs are clear with the exception of streaky linear upper lobe
scarring reidentified. Sclerosis of the humeral heads bilaterally is
compatible with avascular necrosis in this patient with sickle cell
disease. Central vertebral body compression deformities are also
compatible with sickle cell disease. Cholecystectomy clips are
noted.
IMPRESSION: No active cardiopulmonary disease.

## 2015-06-28 ENCOUNTER — Encounter (HOSPITAL_COMMUNITY): Payer: Self-pay | Admitting: *Deleted

## 2015-06-28 ENCOUNTER — Inpatient Hospital Stay (HOSPITAL_COMMUNITY)
Admission: AD | Admit: 2015-06-28 | Discharge: 2015-07-03 | DRG: 781 | Disposition: A | Discharge status: Home / Self Care | Payer: Medicare Other | Source: Ambulatory Visit | Attending: Internal Medicine | Admitting: Internal Medicine

## 2015-06-28 DIAGNOSIS — O99012 Anemia complicating pregnancy, second trimester: Secondary | ICD-10-CM | POA: Diagnosis present

## 2015-06-28 DIAGNOSIS — Z3480 Encounter for supervision of other normal pregnancy, unspecified trimester: Secondary | ICD-10-CM | POA: Diagnosis not present

## 2015-06-28 DIAGNOSIS — Z3A23 23 weeks gestation of pregnancy: Secondary | ICD-10-CM | POA: Diagnosis not present

## 2015-06-28 DIAGNOSIS — Z87891 Personal history of nicotine dependence: Secondary | ICD-10-CM

## 2015-06-28 DIAGNOSIS — D72829 Elevated white blood cell count, unspecified: Secondary | ICD-10-CM | POA: Diagnosis not present

## 2015-06-28 DIAGNOSIS — Z79891 Long term (current) use of opiate analgesic: Secondary | ICD-10-CM

## 2015-06-28 DIAGNOSIS — D5700 Hb-SS disease with crisis, unspecified: Secondary | ICD-10-CM | POA: Diagnosis present

## 2015-06-28 DIAGNOSIS — Z452 Encounter for adjustment and management of vascular access device: Secondary | ICD-10-CM | POA: Diagnosis not present

## 2015-06-28 DIAGNOSIS — D638 Anemia in other chronic diseases classified elsewhere: Secondary | ICD-10-CM | POA: Insufficient documentation

## 2015-06-28 DIAGNOSIS — O2692 Pregnancy related conditions, unspecified, second trimester: Secondary | ICD-10-CM

## 2015-06-28 DIAGNOSIS — Z3492 Encounter for supervision of normal pregnancy, unspecified, second trimester: Secondary | ICD-10-CM | POA: Insufficient documentation

## 2015-06-28 LAB — COMPREHENSIVE METABOLIC PANEL
ALBUMIN: 3.1 g/dL — AB (ref 3.5–5.0)
ALK PHOS: 48 U/L (ref 38–126)
ALT: 13 U/L — ABNORMAL LOW (ref 14–54)
AST: 20 U/L (ref 15–41)
Anion gap: 8 (ref 5–15)
BILIRUBIN TOTAL: 2.6 mg/dL — AB (ref 0.3–1.2)
BUN: 5 mg/dL — AB (ref 6–20)
CHLORIDE: 108 mmol/L (ref 101–111)
CO2: 21 mmol/L — ABNORMAL LOW (ref 22–32)
CREATININE: 0.31 mg/dL — AB (ref 0.44–1.00)
Calcium: 8.3 mg/dL — ABNORMAL LOW (ref 8.9–10.3)
GFR calc Af Amer: >60 mL/min (ref >60)
GLUCOSE: 90 mg/dL (ref 65–99)
POTASSIUM: 3.6 mmol/L (ref 3.5–5.1)
Sodium: 137 mmol/L (ref 135–145)
Total Protein: 6.1 g/dL — ABNORMAL LOW (ref 6.5–8.1)

## 2015-06-28 LAB — CBC
HEMATOCRIT: 25.7 % — AB (ref 36.0–46.0)
HEMATOCRIT: 22.2 % — AB (ref 36.0–46.0)
HEMOGLOBIN: 7.8 g/dL — AB (ref 12.0–15.0)
Hemoglobin: 9 g/dL — ABNORMAL LOW (ref 12.0–15.0)
MCH: 32 pg (ref 26.0–34.0)
MCH: 32.4 pg (ref 26.0–34.0)
MCHC: 35 g/dL (ref 30.0–36.0)
MCHC: 35.1 g/dL (ref 30.0–36.0)
MCV: 91.5 fL (ref 78.0–100.0)
MCV: 92.1 fL (ref 78.0–100.0)
PLATELETS: 492 10*3/uL — AB (ref 150–400)
Platelets: 380 10*3/uL (ref 150–400)
RBC: 2.81 MIL/uL — ABNORMAL LOW (ref 3.87–5.11)
RBC: 2.41 MIL/uL — ABNORMAL LOW (ref 3.87–5.11)
RDW: 17.4 % — AB (ref 11.5–15.5)
RDW: 17.5 % — AB (ref 11.5–15.5)
WBC: 18.9 10*3/uL — AB (ref 4.0–10.5)
WBC: 16.6 10*3/uL — ABNORMAL HIGH (ref 4.0–10.5)

## 2015-06-28 LAB — DIFFERENTIAL
Basophils Absolute: 0 10*3/uL (ref 0.0–0.1)
Basophils Relative: 0 %
EOS ABS: 0.3 10*3/uL (ref 0.0–0.7)
EOS PCT: 2 %
LYMPHS ABS: 2 10*3/uL (ref 0.7–4.0)
LYMPHS PCT: 12 %
MONO ABS: 1.4 10*3/uL — AB (ref 0.1–1.0)
Monocytes Relative: 8 %
Neutro Abs: 13 10*3/uL — ABNORMAL HIGH (ref 1.7–7.7)
Neutrophils Relative %: 78 %

## 2015-06-28 LAB — URINALYSIS, ROUTINE W REFLEX MICROSCOPIC
BILIRUBIN URINE: NEGATIVE
GLUCOSE, UA: NEGATIVE mg/dL
HGB URINE DIPSTICK: NEGATIVE
KETONES UR: NEGATIVE mg/dL
Leukocytes, UA: NEGATIVE
Nitrite: NEGATIVE
PROTEIN: NEGATIVE mg/dL
Specific Gravity, Urine: 1.01 (ref 1.005–1.030)
pH: 5.5 (ref 5.0–8.0)

## 2015-06-28 LAB — RETICULOCYTES
RBC.: 2.81 MIL/uL — AB (ref 3.87–5.11)
Retic Count, Absolute: 573.2 10*3/uL — ABNORMAL HIGH (ref 19.0–186.0)
Retic Ct Pct: 20.4 % — ABNORMAL HIGH (ref 0.4–3.1)

## 2015-06-28 MED ORDER — SODIUM CHLORIDE 0.9% FLUSH
3.0000 mL | Freq: Two times a day (BID) | INTRAVENOUS | Status: DC
  Administered 2015-06-28 – 2015-07-03 (×7): 3 mL via INTRAVENOUS

## 2015-06-28 MED ORDER — HYDROMORPHONE HCL 2 MG/ML IJ SOLN: 0.0313 mg/kg | INTRAMUSCULAR | Status: AC

## 2015-06-28 MED ORDER — SENNOSIDES-DOCUSATE SODIUM 8.6-50 MG PO TABS
1.0000 | ORAL_TABLET | Freq: Two times a day (BID) | ORAL | Status: DC
  Administered 2015-06-28 – 2015-07-03 (×10): 1 via ORAL
  Filled 2015-06-28 (×10): qty 1

## 2015-06-28 MED ORDER — SODIUM CHLORIDE 0.9 % IV SOLN
25.0000 mg | INTRAVENOUS | Status: DC | PRN
  Administered 2015-06-28: 25 mg via INTRAVENOUS
  Filled 2015-06-28 (×4): qty 0.5

## 2015-06-28 MED ORDER — HYDROMORPHONE HCL 2 MG/ML IJ SOLN
2.0000 mg | INTRAMUSCULAR | Status: DC | PRN
  Administered 2015-06-29 – 2015-06-30 (×15): 2 mg via INTRAVENOUS
  Filled 2015-06-28 (×11): qty 1

## 2015-06-28 MED ORDER — HYDROMORPHONE HCL 2 MG/ML IJ SOLN
2.0000 mg | Freq: Once | INTRAMUSCULAR | Status: AC
  Administered 2015-06-28: 2 mg via INTRAVENOUS
  Filled 2015-06-28: qty 1

## 2015-06-28 MED ORDER — SODIUM CHLORIDE 0.9% FLUSH: 9.0000 mL | INTRAVENOUS | Status: DC | PRN

## 2015-06-28 MED ORDER — OXYCODONE HCL 5 MG PO TABS
15.0000 mg | ORAL_TABLET | Freq: Once | ORAL | Status: AC
  Administered 2015-06-28: 15 mg via ORAL
  Filled 2015-06-28: qty 3

## 2015-06-28 MED ORDER — HYDROMORPHONE HCL 2 MG/ML IJ SOLN
0.0375 mg/kg | INTRAMUSCULAR | Status: DC
  Administered 2015-06-28: 3 mg via INTRAVENOUS
  Filled 2015-06-28: qty 2

## 2015-06-28 MED ORDER — HYDROMORPHONE HCL 2 MG/ML IJ SOLN: 0.0375 mg/kg | INTRAMUSCULAR | Status: AC

## 2015-06-28 MED ORDER — NALOXONE HCL 0.4 MG/ML IJ SOLN: 0.4000 mg | INTRAMUSCULAR | Status: DC | PRN

## 2015-06-28 MED ORDER — HYDROMORPHONE HCL 2 MG/ML IJ SOLN
0.0313 mg/kg | INTRAMUSCULAR | Status: AC
  Administered 2015-06-28: 2.5 mg via INTRAVENOUS
  Filled 2015-06-28: qty 2

## 2015-06-28 MED ORDER — ONDANSETRON HCL 4 MG/2ML IJ SOLN
4.0000 mg | Freq: Four times a day (QID) | INTRAMUSCULAR | Status: DC | PRN
Start: 2015-06-28 — End: 2015-07-02
  Administered 2015-06-29 – 2015-06-30 (×3): 4 mg via INTRAVENOUS
  Filled 2015-06-28 (×3): qty 2

## 2015-06-28 MED ORDER — SODIUM CHLORIDE 0.45 % IV SOLN
INTRAVENOUS | Status: DC
  Administered 2015-06-28 – 2015-07-01 (×5): via INTRAVENOUS
  Administered 2015-07-02: 500 mL via INTRAVENOUS
  Administered 2015-07-02: 02:00:00 via INTRAVENOUS

## 2015-06-28 MED ORDER — FOLIC ACID 1 MG PO TABS
2.0000 mg | ORAL_TABLET | Freq: Every day | ORAL | Status: DC
  Administered 2015-06-28 – 2015-07-03 (×6): 2 mg via ORAL
  Filled 2015-06-28 (×6): qty 2

## 2015-06-28 MED ORDER — ADULT MULTIVITAMIN W/MINERALS CH
2.0000 | ORAL_TABLET | Freq: Every day | ORAL | Status: DC
  Administered 2015-06-28 – 2015-07-03 (×6): 2 via ORAL
  Filled 2015-06-28 (×6): qty 2

## 2015-06-28 MED ORDER — METHADONE HCL 5 MG PO TABS
5.0000 mg | ORAL_TABLET | Freq: Every day | ORAL | Status: DC
  Administered 2015-06-28 – 2015-07-02 (×5): 5 mg via ORAL
  Filled 2015-06-28 (×5): qty 1

## 2015-06-28 MED ORDER — SODIUM CHLORIDE 0.9% FLUSH: 3.0000 mL | INTRAVENOUS | Status: DC | PRN

## 2015-06-28 MED ORDER — POLYETHYLENE GLYCOL 3350 17 G PO PACK: 17.0000 g | PACK | Freq: Every day | ORAL | Status: DC | PRN

## 2015-06-28 MED ORDER — DIPHENHYDRAMINE HCL 25 MG PO CAPS: 25.0000 mg | ORAL_CAPSULE | Freq: Four times a day (QID) | ORAL | Status: DC | PRN

## 2015-06-28 MED ORDER — SODIUM CHLORIDE 0.9 % IV SOLN: 250.0000 mL | INTRAVENOUS | Status: DC | PRN

## 2015-06-28 MED ORDER — HYDROMORPHONE HCL 2 MG/ML IJ SOLN
2.0000 mg | INTRAMUSCULAR | Status: AC
  Administered 2015-06-28 (×6): 2 mg via INTRAVENOUS
  Filled 2015-06-28 (×6): qty 1

## 2015-06-28 MED ORDER — HYDROMORPHONE HCL 2 MG/ML IJ SOLN: 0.0375 mg/kg | INTRAMUSCULAR | Status: DC

## 2015-06-28 MED ORDER — LACTATED RINGERS IV BOLUS (SEPSIS)
1000.0000 mL | Freq: Once | INTRAVENOUS | Status: AC
  Administered 2015-06-28: 1000 mL via INTRAVENOUS

## 2015-06-28 MED ORDER — HYDROMORPHONE 1 MG/ML IV SOLN
INTRAVENOUS | Status: DC
  Administered 2015-06-28: 25 mg via INTRAVENOUS
  Administered 2015-06-28: 7.8 mg via INTRAVENOUS
  Administered 2015-06-28: 4.2 mg via INTRAVENOUS
  Administered 2015-06-29: 7.6 mg via INTRAVENOUS
  Administered 2015-06-29: 1.8 mg via INTRAVENOUS
  Administered 2015-06-29: 11 mg via INTRAVENOUS
  Administered 2015-06-29: 6.4 mg via INTRAVENOUS
  Administered 2015-06-29 – 2015-06-30 (×2): 4.2 mg via INTRAVENOUS
  Administered 2015-06-30: 5.6 mg via INTRAVENOUS
  Administered 2015-06-30: 4.2 mg via INTRAVENOUS
  Administered 2015-06-30: 5.4 mg via INTRAVENOUS
  Administered 2015-06-30: 3.6 mg via INTRAVENOUS
  Administered 2015-06-30: 0.6 mg via INTRAVENOUS
  Administered 2015-06-30: 02:00:00 via INTRAVENOUS
  Administered 2015-07-01: 3 mg via INTRAVENOUS
  Administered 2015-07-01: 08:00:00 via INTRAVENOUS
  Administered 2015-07-01: 3 mg via INTRAVENOUS
  Administered 2015-07-01: 4.2 mg via INTRAVENOUS
  Administered 2015-07-01: 2.1 mg via INTRAVENOUS
  Administered 2015-07-02: 1.2 mg via INTRAVENOUS
  Administered 2015-07-02: 6 mg via INTRAVENOUS
  Administered 2015-07-02: 4.19 mg via INTRAVENOUS
  Administered 2015-07-02: 4.2 mg via INTRAVENOUS
  Filled 2015-06-28 (×5): qty 25

## 2015-06-28 MED ORDER — DIPHENHYDRAMINE HCL 50 MG/ML IJ SOLN
25.0000 mg | Freq: Once | INTRAMUSCULAR | Status: AC
  Administered 2015-06-28: 25 mg via INTRAVENOUS
  Filled 2015-06-28: qty 1

## 2015-06-28 MED ORDER — ENOXAPARIN SODIUM 40 MG/0.4ML ~~LOC~~ SOLN
40.0000 mg | Freq: Every day | SUBCUTANEOUS | Status: DC
  Administered 2015-06-28 – 2015-07-01 (×4): 40 mg via SUBCUTANEOUS
  Filled 2015-06-28 (×5): qty 0.4

## 2015-06-28 MED ORDER — HYDROMORPHONE HCL 2 MG/ML IJ SOLN
0.0375 mg/kg | INTRAMUSCULAR | Status: AC
  Administered 2015-06-28: 3 mg via INTRAVENOUS
  Filled 2015-06-28: qty 2

## 2015-06-28 MED ORDER — ONDANSETRON HCL 4 MG/2ML IJ SOLN
4.0000 mg | Freq: Once | INTRAMUSCULAR | Status: AC
  Administered 2015-06-28: 4 mg via INTRAVENOUS
  Filled 2015-06-28: qty 2

## 2015-06-28 NOTE — Progress Notes (Signed)
Up to Br via w/c

## 2015-06-28 NOTE — H&P (Signed)
Hospital Admission Note Date: 06/28/2015  Patient name: [name] Medical record number: [id number] Date of birth: 1990-07-25 Age: 25 y.o. Gender: female PCP: Angelica Chessman, MD  Attending physician: No att. providers found  Chief Complaint: Pain in LUE and RLE x  1 day  History of Present Illness: This is an opiate tolerant patient with Hb SS who presented with pain in left elbow and right knee which began last night. Pt reported pain 7/10 on arrival to the hospital which escalated to 9/10 while in the ED. Pain is described as constant, aching and occasionally sharp. It is non-radiating in nature. She has had nausea and ongoing constipation but denies any other symptoms. Specifically she denies any diarrhea, emesis, HA, SOB, Chest pain or focal neurological deficits. Of note she is approximately 23 weeks but has had no pre-natal care up this point however she is felt to be about 23 weeks by examination per Nurse Midwife. At this point she will be planning to bring this pregnancy to term although she had expressed desire to terminate pregnancy.   She has received 4 doses of Dilaudid in the ED at [facility] and her pain is still at 9/10.   Scheduled Meds: . enoxaparin (LOVENOX) injection  40 mg Subcutaneous Daily  . folic acid  2 mg Oral Daily  . HYDROmorphone   Intravenous 6 times per day  .  HYDROmorphone (DILAUDID) injection  2 mg Intravenous Q2H  . methadone  5 mg Oral QHS  . multivitamin with minerals  2 tablet Oral Daily  . senna-docusate  1 tablet Oral BID  . sodium chloride flush  3 mL Intravenous Q12H   Continuous Infusions: . sodium chloride 150 mL/hr at 06/28/15 0400   PRN Meds:.sodium chloride, diphenhydrAMINE **OR** diphenhydrAMINE (BENADRYL) IVPB(SICKLE CELL ONLY), HYDROmorphone (DILAUDID) injection **FOLLOWED BY** HYDROmorphone (DILAUDID) injection, naloxone **AND** sodium chloride flush, ondansetron (ZOFRAN) IV, polyethylene glycol, sodium chloride  flush Allergies: Carrot; Carrot oil; and Latex Past Medical History  Diagnosis Date  . Miscarriage 03/22/2011    Pt reports 2 miscarriages.  . Depression 01/06/2011  . GERD (gastroesophageal reflux disease) 02/17/2011  . Trichotillomania     h/o  . Blood transfusion     "lots"  . Sickle cell anemia with crisis ([place])   . Exertional dyspnea     "sometimes"  . Migraines 11/08/11    "@ least twice/month"  . Chronic back pain     "very severe; have knot in my back; from tight muscle; take RX and exercise for it"  . Mood swings ([place]) 11/08/11    "I go back and forth; real bad"  . Sickle cell anemia [facility])    Past Surgical History  Procedure Laterality Date  . Cholecystectomy  05/2010  . Dilation and curettage of uterus  02/20/11    S/P miscarriage   Family History  Problem Relation Age of Onset  . Diabetes Maternal Grandmother   . Diabetes Paternal Grandmother   . Hypertension Paternal Grandmother   . Diabetes Maternal Grandfather   . Sickle cell trait Mother   . Sickle cell trait Father    Social History   Social History  . Marital Status: Single    Spouse Name: N/A  . Number of Children: N/A  . Years of Education: N/A   Occupational History  . Not on file.   Social History Main Topics  . Smoking status: Former Smoker -- 0.25 packs/day for 1 years    Types: Cigarettes    Quit  date: 03/25/2013  . Smokeless tobacco: Never Used  . Alcohol Use: No     Comment: pt states she quit marijuan in May 2013. Rare ETOH, + cigarettes.  She is enrolled in school  . Drug Use: No  . Sexual Activity: Yes    Birth Control/ Protection: None   Other Topics Concern  . 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 Physical Exam: No intake or output data in the 24 hours ending 06/28/15  1620 General: Alert, awake, oriented x3, in no acute distress.  HEENT: [place]/AT PEERL, EOMI, anicteric Neck: Trachea midline,  no masses, no thyromegal,y no JVD, no carotid bruit OROPHARYNX:  Moist, No exudate/ erythema/lesions.  Heart: Regular rate and rhythm, without murmurs, rubs, gallops, PMI non-displaced, no heaves or thrills on palpation.  Lungs: Clear to auscultation, no wheezing or rhonchi noted. No increased vocal fremitus resonant to percussion.  Abdomen: Gravid. Neuro: No focal neurological deficits noted cranial nerves II through XII grossly intact. Strength at functional baseline in bilateral upper and lower extremities. Musculoskeletal: No warm swelling or erythema around joints, no spinal tenderness noted. Psychiatric: Patient alert and oriented x3, good insight and cognition, good recent to remote recall.   Lab results:  Recent Labs  06/28/15 1101  NA 137  K 3.6  CL 108  CO2 21*  GLUCOSE 90  BUN 5*  CREATININE 0.31*  CALCIUM 8.3*    Recent Labs  06/28/15 1101  AST 20  ALT 13*  ALKPHOS 48  BILITOT 2.6*  PROT 6.1*  ALBUMIN 3.1*   No results for input(s): LIPASE, AMYLASE in the last 72 hours.  Recent Labs  06/28/15 0225 06/28/15 1101  WBC 18.9* 16.6*  NEUTROABS  --  13.0*  HGB 9.0* 7.8*  HCT 25.7* 22.2*  MCV 91.5 92.1  PLT 492* 380   No results for input(s): CKTOTAL, CKMB, CKMBINDEX, TROPONINI in the last 72 hours. Invalid input(s): POCBNP No results for input(s): DDIMER in the last 72 hours. No results for input(s): HGBA1C in the last 72 hours. No results for input(s): CHOL, HDL, LDLCALC, TRIG, CHOLHDL, LDLDIRECT in the last 72 hours. No results for input(s): TSH, T4TOTAL, T3FREE, THYROIDAB in the last 72 hours.  Invalid input(s): FREET3  Recent Labs  06/28/15 0225  RETICCTPCT 20.4*   Imaging results:  Ir Fluoro Guide Cv Line Right  06/02/2015  CLINICAL DATA:  Sickle cell disease EXAM: RIGHT UPPER EXTREMITY PICC LINE PLACEMENT WITH  ULTRASOUND AND FLUOROSCOPIC GUIDANCE FLUOROSCOPY TIME:  36 seconds PROCEDURE: The patient was advised of the possible risks and complications and agreed to undergo the procedure. The patient was then brought to the angiographic suite for the procedure. The right arm was prepped with chlorhexidine, draped in the usual sterile fashion using maximum barrier technique (cap and mask, sterile gown, sterile gloves, large sterile sheet, hand hygiene and cutaneous antisepsis) and infiltrated locally with 1% Lidocaine. Ultrasound demonstrated patency of the right basilic vein, and this was documented with an image. Under real-time ultrasound guidance, this vein was accessed with a 21 gauge micropuncture needle and image documentation was performed. A 0.018 wire was introduced in to the vein. Over this, a 5 [place] double lumen power PICC was advanced to the lower SVC/right atrial junction. Fluoroscopy during the  procedure and fluoro spot radiograph confirms appropriate catheter position. The catheter was flushed and covered with a sterile dressing. COMPLICATIONS: None LENGTH: 37 cm IMPRESSION: Successful right arm power PICC line placement with ultrasound and fluoroscopic guidance. The catheter is ready for use. Electronically Signed   By: Marybelle Killings M.D.   On: 06/02/2015 15:21   Ir US Guide Vasc Access Right  06/02/2015  CLINICAL DATA:  Sickle cell disease EXAM: RIGHT UPPER EXTREMITY PICC LINE PLACEMENT WITH ULTRASOUND AND FLUOROSCOPIC GUIDANCE FLUOROSCOPY TIME:  36 seconds PROCEDURE: The patient was advised of the possible risks and complications and agreed to undergo the procedure. The patient was then brought to the angiographic suite for the procedure. The right arm was prepped with chlorhexidine, draped in the usual sterile fashion using maximum barrier technique (cap and mask, sterile gown, sterile gloves, large sterile sheet, hand hygiene and cutaneous antisepsis) and infiltrated locally with 1% Lidocaine.  Ultrasound demonstrated patency of the right basilic vein, and this was documented with an image. Under real-time ultrasound guidance, this vein was accessed with a 21 gauge micropuncture needle and image documentation was performed. A 0.018 wire was introduced in to the vein. Over this, a 5 [place] double lumen power PICC was advanced to the lower SVC/right atrial junction. Fluoroscopy during the procedure and fluoro spot radiograph confirms appropriate catheter position. The catheter was flushed and covered with a sterile dressing. COMPLICATIONS: None LENGTH: 37 cm IMPRESSION: Successful right arm power PICC line placement with ultrasound and fluoroscopic guidance. The catheter is ready for use. Electronically Signed   By: Marybelle Killings M.D.   On: 06/02/2015 15:21      A/P: 1. Hb SS with crisis: Will start patient on Dilaudid PCA, and clinician assisted doses. Will also give scheduled clinician assisted doses  2. Anemia of chronic disease: Hb stable. Goal during pregnancy is to keep HB > 30% and % of Hb S >30.  3. Leukocytosis: No evidence of infection at this time. Will continue to monitor. 4. Constipation: Treat with Senna-S and Miralax as needed. 5. DVT prophylaxis: Lovenox.  Paislea Hatton A. 06/28/2015, 4:20 PM

## 2015-06-28 NOTE — Progress Notes (Signed)
Pt had requested another dose of Dilaudid. Fatima Blank CNM aware and 4th dose ordered. Pt aware if this does not help she will need to be admitted per CNM

## 2015-06-28 NOTE — MAU Note (Signed)
Pain in L elbow and R knee since 1900. Sickle cell pain. Denies LOF or bleeding. Has not had prenatal care. Was suppose to get an abortion due to my illness but things did not go as planned and decided to keep pregnancy. I do have appt in clinic 07/02/15.

## 2015-06-28 NOTE — Progress Notes (Signed)
Patient received from [facility] via Care link. patient alert, Vital signs stable. Will continue to monitor.

## 2015-06-28 NOTE — Progress Notes (Signed)
Utilization review completed.  

## 2015-06-28 NOTE — MAU Note (Signed)
One unsuccessful IV attempt made. Marcie Bal CRNA called and in for iv start

## 2015-06-28 NOTE — Discharge Instructions (Signed)
Sickle Cell Anemia, Adult Sickle cell anemia is a condition in which red blood cells have an abnormal "sickle" shape. This abnormal shape shortens the cells' life span, which results in a lower than normal concentration of red blood cells in the blood. The sickle shape also causes the cells to clump together and block free blood flow through the blood vessels. As a result, the tissues and organs of the body do not receive enough oxygen. Sickle cell anemia causes organ damage and pain and increases the risk of infection. CAUSES  Sickle cell anemia is a genetic disorder. Those who receive two copies of the gene have the condition, and those who receive one copy have the trait. RISK FACTORS The sickle cell gene is most common in people whose families originated in [place]. Other areas of the globe where sickle cell trait occurs include the Mediterranean, [place] and [place], the [place], and the [place].  SIGNS AND SYMPTOMS  Pain, especially in the extremities, back, chest, or abdomen (common). The pain may start suddenly or may develop following an illness, especially if there is dehydration. Pain can also occur due to overexertion or exposure to extreme temperature changes.  Frequent severe bacterial infections, especially certain types of pneumonia and meningitis.  Pain and swelling in the hands and feet.  Decreased activity.   Loss of appetite.   Change in behavior.  Headaches.  Seizures.  Shortness of breath or difficulty breathing.  Vision changes.  Skin ulcers. Those with the trait may not have symptoms or they may have mild symptoms.  DIAGNOSIS  Sickle cell anemia is diagnosed with blood tests that demonstrate the genetic trait. It is often diagnosed during the newborn period, due to mandatory testing nationwide. A variety of blood tests, X-rays, CT scans, MRI scans, ultrasounds, and lung function tests may also be done to monitor the condition. TREATMENT  Sickle  cell anemia may be treated with:  Medicines. You may be given pain medicines, antibiotic medicines (to treat and prevent infections) or medicines to increase the production of certain types of hemoglobin.  Fluids.  Oxygen.  Blood transfusions. HOME CARE INSTRUCTIONS   Drink enough fluid to keep your urine clear or pale yellow. Increase your fluid intake in hot weather and during exercise.  Do not smoke. Smoking lowers oxygen levels in the blood.   Only take over-the-counter or prescription medicines for pain, fever, or discomfort as directed by your health care provider.  Take antibiotics as directed by your health care provider. Make sure you finish them it even if you start to feel better.   Take supplements as directed by your health care provider.   Consider wearing a medical alert bracelet. This tells anyone caring for you in an emergency of your condition.   When traveling, keep your medical information, health care provider's names, and the medicines you take with you at all times.   If you develop a fever, do not take medicines to reduce the fever right away. This could cover up a problem that is developing. Notify your health care provider.  Keep all follow-up appointments with your health care provider. Sickle cell anemia requires regular medical care. SEEK MEDICAL CARE IF: You have a fever. SEEK IMMEDIATE MEDICAL CARE IF:   You feel dizzy or faint.   You have new abdominal pain, especially on the left side near the stomach area.   You develop a persistent, often uncomfortable and painful penile erection (priapism). If this is not treated immediately it   will lead to impotence.   You have numbness your arms or legs or you have a hard time moving them.   You have a hard time with speech.   You have a fever or persistent symptoms for more than 2-3 days.   You have a fever and your symptoms suddenly get worse.   You have signs or symptoms of infection.  These include:   Chills.   Abnormal tiredness (lethargy).   Irritability.   Poor eating.   Vomiting.   You develop pain that is not helped with medicine.   You develop shortness of breath.  You have pain in your chest.   You are coughing up pus-like or bloody sputum.   You develop a stiff neck.  Your feet or hands swell or have pain.  Your abdomen appears bloated.  You develop joint pain. MAKE SURE YOU:  Understand these instructions.   This information is not intended to replace advice given to you by your health care provider. Make sure you discuss any questions you have with your health care provider.   Document Released: 08/10/2005 Document Revised: 05/23/2014 Document Reviewed: 12/12/2012 Elsevier Interactive Patient Education 2016 Elsevier Inc.  

## 2015-06-28 NOTE — MAU Note (Signed)
Notified [name] CNM patient's pain still 7/10, requesting admission for pain control, CNM to place admit orders.

## 2015-06-28 NOTE — MAU Provider Note (Signed)
Chief Complaint: Sickle Cell Pain Crisis   None     SUBJECTIVE HPI: [name] is a 25 y.o. G3P1011 at [redacted]w[redacted]d by LMP who presents to maternity admissions reporting onset of pain her her right knee tonight at 7 pm. The pain is sharp, like needles, constant, and severe.  She tried heating pad, drinking fluids, and the pain medications she is prescribed but none of these reduced the pain.  She has history of sickle cell crisis and she feels this may be the start of a crisis.  She initially planned to terminate this pregnancy but did not and now has prenatal appointment scheduled 07/02/15.  She has follow up appt with sickle cell clinic on 07/06/15.   She denies chest pain, shortness of breath, abdominal cramping, vaginal bleeding, vaginal itching/burning, urinary symptoms, h/a, dizziness, n/v, or fever/chills.     HPI  Past Medical History  Diagnosis Date  . Miscarriage 03/22/2011    Pt reports 2 miscarriages.  . Depression 01/06/2011  . GERD (gastroesophageal reflux disease) 02/17/2011  . Trichotillomania     h/o  . Blood transfusion     "lots"  . Sickle cell anemia with crisis ([place])   . Exertional dyspnea     "sometimes"  . Migraines 11/08/11    "@ least twice/month"  . Chronic back pain     "very severe; have knot in my back; from tight muscle; take RX and exercise for it"  . Mood swings ([place]) 11/08/11    "I go back and forth; real bad"  . Sickle cell anemia [facility])    Past Surgical History  Procedure Laterality Date  . Cholecystectomy  05/2010  . Dilation and curettage of uterus  02/20/11    S/P miscarriage   Social History   Social History  . Marital Status: Single    Spouse Name: N/A  . Number of Children: N/A  . Years of Education: N/A   Occupational History  . Not on file.   Social History Main Topics  . Smoking status: Former Smoker -- 0.25 packs/day for 1 years    Types: Cigarettes    Quit date: 03/25/2013  . Smokeless tobacco: Never Used  . Alcohol Use: No     Comment: pt states she quit marijuan in May 2013. Rare ETOH, + cigarettes.  She is enrolled in school  . Drug Use: No  . Sexual Activity: Yes    Birth Control/ Protection: None   Other Topics Concern  . Not on file   Social History Narrative   Lives  Wit mother   FOB is supportive-supposed to be moving next month   Is a student at [employer] time   No current facility-administered medications on file prior to encounter.   Current Outpatient Prescriptions on File Prior to Encounter  Medication Sig Dispense Refill  . methadone (DOLOPHINE) 5 MG tablet Take 1 tablet (5 mg total) by mouth at bedtime. 30 tablet 0  . oxyCODONE (ROXICODONE) 15 MG immediate release tablet Take 1 tablet (15 mg total) by mouth every 4 (four) hours as needed for pain. 90 tablet 0  . acetaminophen (TYLENOL) 500 MG tablet Take 1,000 mg by mouth every 6 (six) hours as needed for moderate pain.    . folic acid (FOLVITE) 1 MG tablet Take 2 tablets (2 mg total) by mouth daily. (Patient not taking: Reported on 06/28/2015) 60 tablet 2  . [DISCONTINUED] medroxyPROGESTERone (DEPO-PROVERA) 150 MG/ML injection Inject 150 mg into the muscle every 3 (three) months.  Last dose 04/20/11     Allergies  Allergen Reactions  . Carrot [Daucus Carota] Hives, Swelling and Rash    Dietary:  Please do not send any form of carrot to patient  . Carrot Oil Hives and Swelling  . Latex Rash    *powdered Latex*     ROS:  Review of Systems  Constitutional: Negative for fever, chills and fatigue.  Respiratory: Negative for shortness of breath.   Cardiovascular: Negative for chest pain.  Gastrointestinal: Negative for abdominal pain.  Genitourinary: Negative for dysuria, flank pain, vaginal bleeding, vaginal discharge, difficulty urinating, vaginal pain and pelvic pain.  Musculoskeletal:       Pain in right knee joint  Neurological: Negative for dizziness and headaches.  Psychiatric/Behavioral: Negative.      I have reviewed patient's  Past Medical Hx, Surgical Hx, Family Hx, Social Hx, medications and allergies.   Physical Exam   Patient Vitals for the past 24 hrs:  BP Temp Pulse Resp SpO2 Height Weight  06/28/15 0655 - - - 16 - - -  06/28/15 0651 - - 111 - 95 % - -  06/28/15 0646 - - 113 - 97 % - -  06/28/15 0641 - - 109 - 96 % - -  06/28/15 0636 - - 106 - 96 % - -  06/28/15 0631 - - 108 - 95 % - -  06/28/15 0626 - - 105 - 95 % - -  06/28/15 0621 - - 108 - 94 % - -  06/28/15 0616 - - 108 - 96 % - -  06/28/15 0611 - - 113 - 92 % - -  06/28/15 0522 - - - 18 95 % - -  06/28/15 0521 - - 115 - 93 % - -  06/28/15 0516 - - 110 - 94 % - -  06/28/15 0511 - - 108 - 93 % - -  06/28/15 0506 - - 115 - 94 % - -  06/28/15 0501 - - 107 - 95 % - -  06/28/15 0456 - - 106 - 94 % - -  06/28/15 0455 116/58 mmHg - 106 - - - -  06/28/15 0454 - - - 18 96 % - -  06/28/15 0451 - - 116 - 93 % - -  06/28/15 0446 - - (!) 128 - 94 % - -  06/28/15 0441 - - (!) 124 - 95 % - -  06/28/15 0436 - - 107 - 94 % - -  06/28/15 0431 - - 106 - 94 % - -  06/28/15 0426 - - 107 - 96 % - -  06/28/15 0421 - - 104 - 93 % - -  06/28/15 0416 - - 109 - 95 % - -  06/28/15 0406 - - 99 - 94 % - -  06/28/15 0401 - - 101 - 95 % - -  06/28/15 0400 - - - 18 95 % - -  06/28/15 0356 - - 105 - 96 % - -  06/28/15 0351 - - 111 - 95 % - -  06/28/15 0346 - - - - 98 % - -  06/28/15 0321 109/60 mmHg - 91 18 95 % - -  06/28/15 0054 - - - - 99 % - -  06/28/15 0048 122/78 mmHg 98.1 F (36.7 C) 108 18 - 5\' 2"  (1.575 m) 175 lb (79.379 kg)   Constitutional: Well-developed, well-nourished female in no acute distress.  HEART: normal rate, heart sounds, regular rhythm RESP: normal effort,  lung sounds clear and equal bilaterally GI: Abd soft, non-tender. Pos BS x 4 MS: Extremities nontender, no edema, normal ROM Neurologic: Alert and oriented x 4.  GU: Neg CVAT.  FHT 140 by doppler  Fundal height 22 cm  LAB RESULTS Results for orders placed or performed during the  hospital encounter of 06/28/15 (from the past 24 hour(s))  Urinalysis, Routine w reflex microscopic (not at [facility])     Status: None   Collection Time: 06/28/15  1:00 AM  Result Value Ref Range   Color, Urine YELLOW YELLOW   APPearance CLEAR CLEAR   Specific Gravity, Urine 1.010 1.005 - 1.030   pH 5.5 5.0 - 8.0   Glucose, UA NEGATIVE NEGATIVE mg/dL   Hgb urine dipstick NEGATIVE NEGATIVE   Bilirubin Urine NEGATIVE NEGATIVE   Ketones, ur NEGATIVE NEGATIVE mg/dL   Protein, ur NEGATIVE NEGATIVE mg/dL   Nitrite NEGATIVE NEGATIVE   Leukocytes, UA NEGATIVE NEGATIVE  CBC     Status: Abnormal   Collection Time: 06/28/15  2:25 AM  Result Value Ref Range   WBC 18.9 (H) 4.0 - 10.5 K/uL   RBC 2.81 (L) 3.87 - 5.11 MIL/uL   Hemoglobin 9.0 (L) 12.0 - 15.0 g/dL   HCT 25.7 (L) 36.0 - 46.0 %   MCV 91.5 78.0 - 100.0 fL   MCH 32.0 26.0 - 34.0 pg   MCHC 35.0 30.0 - 36.0 g/dL   RDW 17.4 (H) 11.5 - 15.5 %   Platelets 492 (H) 150 - 400 K/uL  Reticulocytes     Status: Abnormal   Collection Time: 06/28/15  2:25 AM  Result Value Ref Range   Retic Ct Pct 20.4 (H) 0.4 - 3.1 %   RBC. 2.81 (L) 3.87 - 5.11 MIL/uL   Retic Count, Manual 573.2 (H) 19.0 - 186.0 K/uL    --/--/B POS (12/05 1640)  MAU Management/MDM: Ordered labs and reviewed results.  ED Sickle Cell Crisis order set initiated and 4 doses of Dilaudid given with IV fluids over several hours.  Pt reports pain remains >7/10.   Consult Dr Kennon Rounds.  Consult [name].  Ad[facility] to [facility] for management of Sickle Cell Crisis.  Oxycodone immediate release 15 mg given PO per [name] prior to transfer.  Pt [redacted]w[redacted]d by unsure LMP, 22 weeks by fundal height measurement today.  Outpatient [place] ordered for dating. Pt has [place] appt on 07/02/15.  Pt stable at time of transfer.  ASSESSMENT 1. Sickle-cell disease with pain ([place])   2. Pregnancy complication, antepartum, second trimester     PLAN Transfer to [facility] for admission Attending Liston Alba, MD F/U  with [facility] as scheduled for prenatal care     Medication List    STOP taking these medications        prenatal multivitamin Tabs tablet      TAKE these medications        acetaminophen 500 MG tablet  Commonly known as:  TYLENOL  Take 1,000 mg by mouth every 6 (six) hours as needed for moderate pain.     calcium carbonate 500 MG chewable tablet  Commonly known as:  TUMS - dosed in mg elemental calcium  Chew 3 tablets by mouth daily.     folic acid 1 MG tablet  Commonly known as:  FOLVITE  Take 2 tablets (2 mg total) by mouth daily.     methadone 5 MG tablet  Commonly known as:  DOLOPHINE  Take 1 tablet (5 mg total) by mouth  at bedtime.     oxyCODONE 15 MG immediate release tablet  Commonly known as:  ROXICODONE  Take 1 tablet (15 mg total) by mouth every 4 (four) hours as needed for pain.      ASK your doctor about these medications        [employer] PRENATAL GUMMY 0.4-113.5 MG Chew  Chew 2-3 each by mouth daily.       Follow-up Information    Follow up with [facility].   Specialty:  Obstetrics and Gynecology   Why:  As scheduled, Return to MAU as needed for emergencies   Contact information:   [place] [phone]      Please follow up.   Why:  Sickle Cell Clinic as scheduled on 07/06/15      Fatima Blank Certified Nurse-Midwife 06/28/2015  8:03 AM

## 2015-06-29 NOTE — Progress Notes (Signed)
SICKLE CELL SERVICE PROGRESS NOTE  [name] [id number] DOB: 1990/11/06 DOA: 06/28/2015 PCP: Angelica Chessman, MD  Assessment/Plan: Active Problems:   Hb-SS disease with crisis ([place])   Anemia of chronic disease   Leukocytosis   Second trimester fetus  1. Hb SS with crisis: Pt still rating pain as 9/10. She has used 36 mg with 45/41:demands/deliveries in the last 23 hours. Will continue current regimen and re-assess tomorrow.  2. Leukocytosis: Likely secondary to crisis. Pt has no overt clinical signs of infection. 3. Anemia of chronic disease: Hb stable yesterday. Goal during pregnancy is to keep Hb S <30% and Hb levels >8. Will check hemoglobin electrophoresis. Pt enquiring who will do her partial exchange transfusions during pregnancy. I have made NP [place] Hollis aware of patient's concern.  4. Chronic pain: Continue Methadone 5 mg q HS. 5. 2nd Trimester Pregnancy: Pt initially intended to terminate the pregnancy but has since changed her mind and plans to take the pregnancy to term. She has not yet had her initial visit for pre-natal care but is scheduled for Ob-Gyn visit on 07/01/2014.  Code Status: Full Code Family Communication: N/A Disposition Plan: Not yet ready for discharge  [place].  Pager [phone]. If 7PM-7AM, please contact night-coverage.  06/29/2015, 12:45 PM  LOS: 1 day   Interim History: Pt rates pain as 9/10 and localized and localized to back and right leg.   Consultants:  None  Procedures:  None  Antibiotics:  None    Objective: Filed Vitals:   06/29/15 0559 06/29/15 0815 06/29/15 1030 06/29/15 1217  BP: 100/88  121/69   Pulse: 103  106   Temp: 98.1 F (36.7 C)  97.8 F (36.6 C)   TempSrc: Oral  Axillary   Resp: 17 14 16 13   Height:      Weight:      SpO2: 98% 96% 93% 97%   Weight change: 2 lb 0.5 oz (0.921 kg) No intake or output data in the 24 hours ending 06/29/15 1245  General: Alert, awake, oriented x3, in  mild distress due to pain.  HEENT: [place]/AT [place]ERL, EOMI, anicteric Neck: Trachea midline,  no masses, no thyromegal,y no JVD, no carotid bruit OROPHARYNX:  Moist, No exudate/ erythema/lesions.  Heart: Regular rate and rhythm, without murmurs, rubs, gallops, PMI non-displaced, no heaves or thrills on palpation.  Lungs: Clear to auscultation, no wheezing or rhonchi noted. No increased vocal fremitus resonant to percussion  Abdomen: Soft, nontender, nondistended, positive bowel sounds, no masses no hepatosplenomegaly noted..  Neuro: No focal neurological deficits noted cranial nerves II through XII grossly intact.  Strength at functional baseline in bilateral upper and lower extremities. Musculoskeletal: No warm swelling or erythema around joints, no spinal tenderness noted. Psychiatric: Patient alert and oriented x3, good insight and cognition, good recent to remote recall.    Data Reviewed: Basic Metabolic Panel:  Recent Labs Lab 06/28/15 1101  NA 137  K 3.6  CL 108  CO2 21*  GLUCOSE 90  BUN 5*  CREATININE 0.31*  CALCIUM 8.3*   Liver Function Tests:  Recent Labs Lab 06/28/15 1101  AST 20  ALT 13*  ALKPHOS 48  BILITOT 2.6*  PROT 6.1*  ALBUMIN 3.1*   No results for input(s): LIPASE, AMYLASE in the last 168 hours. No results for input(s): AMMONIA in the last 168 hours. CBC:  Recent Labs Lab 06/28/15 0225 06/28/15 1101  WBC 18.9* 16.6*  NEUTROABS  --  13.0*  HGB 9.0* 7.8*  HCT 25.7*  22.2*  MCV 91.5 92.1  PLT 492* 380   Cardiac Enzymes: No results for input(s): CKTOTAL, CKMB, CKMBINDEX, TROPONINI in the last 168 hours. BNP (last 3 results) No results for input(s): BNP in the last 8760 hours.  ProBNP (last 3 results) No results for input(s): PROBNP in the last 8760 hours.  CBG: No results for input(s): GLUCAP in the last 168 hours.  No results found for this or any previous visit (from the past 240 hour(s)).   Studies: Ir Fluoro Guide Cv Line  Right  06/02/2015  CLINICAL DATA:  Sickle cell disease EXAM: RIGHT UPPER EXTREMITY PICC LINE PLACEMENT WITH ULTRASOUND AND FLUOROSCOPIC GUIDANCE FLUOROSCOPY TIME:  36 seconds PROCEDURE: The patient was advised of the possible risks and complications and agreed to undergo the procedure. The patient was then brought to the angiographic suite for the procedure. The right arm was prepped with chlorhexidine, draped in the usual sterile fashion using maximum barrier technique (cap and mask, sterile gown, sterile gloves, large sterile sheet, hand hygiene and cutaneous antisepsis) and infiltrated locally with 1% Lidocaine. Ultrasound demonstrated patency of the right basilic vein, and this was documented with an image. Under real-time ultrasound guidance, this vein was accessed with a 21 gauge micropuncture needle and image documentation was performed. A 0.018 wire was introduced in to the vein. Over this, a 5 [place] double lumen power PICC was advanced to the lower SVC/right atrial junction. Fluoroscopy during the procedure and fluoro spot radiograph confirms appropriate catheter position. The catheter was flushed and covered with a sterile dressing. COMPLICATIONS: None LENGTH: 37 cm IMPRESSION: Successful right arm power PICC line placement with ultrasound and fluoroscopic guidance. The catheter is ready for use. Electronically Signed   By: Marybelle Killings M.D.   On: 06/02/2015 15:21   Ir US Guide Vasc Access Right  06/02/2015  CLINICAL DATA:  Sickle cell disease EXAM: RIGHT UPPER EXTREMITY PICC LINE PLACEMENT WITH ULTRASOUND AND FLUOROSCOPIC GUIDANCE FLUOROSCOPY TIME:  36 seconds PROCEDURE: The patient was advised of the possible risks and complications and agreed to undergo the procedure. The patient was then brought to the angiographic suite for the procedure. The right arm was prepped with chlorhexidine, draped in the usual sterile fashion using maximum barrier technique (cap and mask, sterile gown, sterile gloves,  large sterile sheet, hand hygiene and cutaneous antisepsis) and infiltrated locally with 1% Lidocaine. Ultrasound demonstrated patency of the right basilic vein, and this was documented with an image. Under real-time ultrasound guidance, this vein was accessed with a 21 gauge micropuncture needle and image documentation was performed. A 0.018 wire was introduced in to the vein. Over this, a 5 [place] double lumen power PICC was advanced to the lower SVC/right atrial junction. Fluoroscopy during the procedure and fluoro spot radiograph confirms appropriate catheter position. The catheter was flushed and covered with a sterile dressing. COMPLICATIONS: None LENGTH: 37 cm IMPRESSION: Successful right arm power PICC line placement with ultrasound and fluoroscopic guidance. The catheter is ready for use. Electronically Signed   By: Marybelle Killings M.D.   On: 06/02/2015 15:21    Scheduled Meds: . enoxaparin (LOVENOX) injection  40 mg Subcutaneous Daily  . folic acid  2 mg Oral Daily  . HYDROmorphone   Intravenous 6 times per day  . methadone  5 mg Oral QHS  . multivitamin with minerals  2 tablet Oral Daily  . senna-docusate  1 tablet Oral BID  . sodium chloride flush  3 mL Intravenous Q12H   Continuous Infusions: .  sodium chloride 10 mL/hr at 06/29/15 1217   Time spent 25 minutes.

## 2015-06-30 ENCOUNTER — Ambulatory Visit: Payer: Medicare Other | Admitting: Family Medicine

## 2015-06-30 ENCOUNTER — Inpatient Hospital Stay (HOSPITAL_COMMUNITY): Payer: Medicare Other

## 2015-06-30 LAB — CBC WITH DIFFERENTIAL/PLATELET
BASOS ABS: 0 10*3/uL (ref 0.0–0.1)
Basophils Relative: 0 %
EOS PCT: 3 %
Eosinophils Absolute: 0.4 10*3/uL (ref 0.0–0.7)
HCT: 22.6 % — ABNORMAL LOW (ref 36.0–46.0)
HEMOGLOBIN: 7.8 g/dL — AB (ref 12.0–15.0)
LYMPHS ABS: 1.7 10*3/uL (ref 0.7–4.0)
LYMPHS PCT: 11 %
MCH: 32.6 pg (ref 26.0–34.0)
MCHC: 34.5 g/dL (ref 30.0–36.0)
MCV: 94.6 fL (ref 78.0–100.0)
Monocytes Absolute: 1.3 10*3/uL — ABNORMAL HIGH (ref 0.1–1.0)
Monocytes Relative: 8 %
NEUTROS ABS: 12 10*3/uL — AB (ref 1.7–7.7)
NEUTROS PCT: 78 %
Platelets: 437 10*3/uL — ABNORMAL HIGH (ref 150–400)
RBC: 2.39 MIL/uL — AB (ref 3.87–5.11)
RDW: 18.2 % — AB (ref 11.5–15.5)
WBC: 15.4 10*3/uL — AB (ref 4.0–10.5)

## 2015-06-30 LAB — BASIC METABOLIC PANEL
ANION GAP: 8 (ref 5–15)
BUN: 6 mg/dL (ref 6–20)
CHLORIDE: 106 mmol/L (ref 101–111)
CO2: 24 mmol/L (ref 22–32)
Calcium: 8.7 mg/dL — ABNORMAL LOW (ref 8.9–10.3)
Creatinine, Ser: 0.4 mg/dL — ABNORMAL LOW (ref 0.44–1.00)
GFR calc Af Amer: >60 mL/min (ref >60)
GLUCOSE: 94 mg/dL (ref 65–99)
POTASSIUM: 4.1 mmol/L (ref 3.5–5.1)
Sodium: 138 mmol/L (ref 135–145)

## 2015-06-30 LAB — RETICULOCYTES
RBC.: 2.39 MIL/uL — ABNORMAL LOW (ref 3.87–5.11)
Retic Ct Pct: >23 % — ABNORMAL HIGH (ref 0.4–3.1)

## 2015-06-30 LAB — PREPARE RBC (CROSSMATCH)

## 2015-06-30 MED ORDER — OXYCODONE HCL 5 MG PO TABS
15.0000 mg | ORAL_TABLET | ORAL | Status: DC
Start: 2015-06-30 — End: 2015-06-30
  Administered 2015-06-30: 15 mg via ORAL
  Filled 2015-06-30: qty 3

## 2015-06-30 MED ORDER — LIDOCAINE HCL 1 % IJ SOLN
INTRAMUSCULAR | Status: AC
  Filled 2015-06-30: qty 20

## 2015-06-30 MED ORDER — HYDROMORPHONE HCL 2 MG/ML IJ SOLN
2.0000 mg | INTRAMUSCULAR | Status: DC | PRN
  Administered 2015-06-30 – 2015-07-03 (×23): 2 mg via INTRAVENOUS
  Filled 2015-06-30 (×24): qty 1

## 2015-06-30 MED ORDER — LIDOCAINE HCL 1 % IJ SOLN
INTRAMUSCULAR | Status: DC | PRN
  Administered 2015-06-30: 5 mL

## 2015-06-30 MED ORDER — HYDROMORPHONE HCL 2 MG/ML IJ SOLN
2.0000 mg | INTRAMUSCULAR | Status: DC | PRN
  Administered 2015-06-30: 2 mg via SUBCUTANEOUS
  Filled 2015-06-30: qty 1

## 2015-06-30 MED ORDER — SODIUM CHLORIDE 0.9 % IV SOLN: Freq: Once | INTRAVENOUS | Status: DC

## 2015-06-30 MED ORDER — OXYCODONE HCL 5 MG PO TABS: 10.0000 mg | ORAL_TABLET | ORAL | Status: DC

## 2015-06-30 NOTE — Progress Notes (Signed)
MEDICATION RELATED CONSULT NOTE   IR Procedure Consult - Anticoagulant/Antiplatelet PTA/Inpatient Med List Review by Pharmacist   Procedure:  PICC placement Completed: [id number] hr today Procedural bleeding risk per IR MD assessment:  Low  Antithrombotic medications on inpatient or PTA profile prior to procedure: Prophylactic-dose LMWH (Lovenox 40 mg SQ q24h).  Recommended restart time per IR Post-Procedure Guidelines: at least 4 hrs post-procedure, at next scheduled dose time.  Plan: Continue prophylactic-dose LMWH, next dose due 10 pm  Clayburn Pert, PharmD, BCPS Pager: [phone] 06/30/2015  8:19 PM

## 2015-06-30 NOTE — Progress Notes (Signed)
SICKLE CELL SERVICE PROGRESS NOTE  [name] [id number] DOB: 1990/07/14 DOA: 06/28/2015 PCP: Angelica Chessman, MD  Assessment/Plan: Active Problems:   Hb-SS disease with crisis ([place])   Anemia of chronic disease   Leukocytosis   Second trimester fetus  1. Hb SS with crisis: Pt still rating pain as 7-8/10 and localized mainly to right knee and thigh. She has used 22.8 mg with 41/36:demands/deliveries in addition to 20 mg  the last 23 hours. Currently she has no IV access. So I will give analgesics orally and subcutaeously until IV access re-established. Then will resume PCA and IV route of medications.  2. Leukocytosis: Likely secondary to crisis. Pt has no overt clinical signs of infection. 3. Anemia of chronic disease: Hb stable at 7.8 g/dl. I have reviewed her Electrophoresis from 03/31/2015 and at that time her  of HB S was 81.7.%.  She has received 1  transfusion of 1 unit RBC's since then. I will likely transfuse 1 unit of RBC to ensure adequate fetal growth.     Goal during pregnancy is to keep Hb S <30% and Hb levels >8.Hemoglobin electrophoresis in process. Pt enquiring who will do her partial exchange transfusions during pregnancy. I have made NP [place] Hollis aware of patient's concern.  4. Chronic pain: Continue Methadone 5 mg q HS. 5. 2nd Trimester Pregnancy: Pt initially intended to terminate the pregnancy but has since changed her mind and plans to take the pregnancy to term. She has not yet had her initial visit for pre-natal care but is scheduled for Ob-Gyn visit on 07/01/2014. Will ask Ob-Rapid response to evaluate.  6. Constipation: Pt has not had a BM in 3 days. Will ak nurse to give Miralax today.  Code Status: Full Code Family Communication: N/A Disposition Plan: Not yet ready for discharge  [place].  Pager [phone]. If 7PM-7AM, please contact night-coverage.  06/30/2015, 12:14 PM  LOS: 2 days   Interim History: Pt rates pain as 7-8/10 and  localized and localized to right leg. Last BM 3 days ago.  Consultants:  None  Procedures:  None  Antibiotics:  None    Objective: Filed Vitals:   06/30/15 0633 06/30/15 0859 06/30/15 1006 06/30/15 1113  BP: 116/74  115/77   Pulse: 100  103   Temp: 98.2 F (36.8 C)  98.3 F (36.8 C)   TempSrc: Oral  Oral   Resp: 14 15 16 16   Height:      Weight: 174 lb 6.1 oz (79.1 kg)     SpO2: 97%  97%    Weight change: -2 lb 10.3 oz (-1.2 kg)  Intake/Output Summary (Last 24 hours) at 06/30/15 1214 Last data filed at 06/29/15 2105  Gross per 24 hour  Intake    120 ml  Output      0 ml  Net    120 ml    General: Alert, awake, oriented x3, in mild distress due to pain.  HEENT: [place]/AT PEERL, EOMI, anicteric Neck: Trachea midline,  no masses, no thyromegal,y no JVD, no carotid bruit OROPHARYNX:  Moist, No exudate/ erythema/lesions.  Heart: Regular rate and rhythm, without murmurs, rubs, gallops, PMI non-displaced, no heaves or thrills on palpation.  Lungs: Clear to auscultation, no wheezing or rhonchi noted. No increased vocal fremitus resonant to percussion  Abdomen: Soft, nontender, nondistended, positive bowel sounds, no masses no hepatosplenomegaly noted..  Neuro: No focal neurological deficits noted cranial nerves II through XII grossly intact.  Strength at functional baseline in bilateral  upper and lower extremities. Musculoskeletal: No warm swelling or erythema around joints, no spinal tenderness noted. Psychiatric: Patient alert and oriented x3, good insight and cognition, good recent to remote recall.    Data Reviewed: Basic Metabolic Panel:  Recent Labs Lab 06/28/15 1101 06/30/15 0358  NA 137 138  K 3.6 4.1  CL 108 106  CO2 21* 24  GLUCOSE 90 94  BUN 5* 6  CREATININE 0.31* 0.40*  CALCIUM 8.3* 8.7*   Liver Function Tests:  Recent Labs Lab 06/28/15 1101  AST 20  ALT 13*  ALKPHOS 48  BILITOT 2.6*  PROT 6.1*  ALBUMIN 3.1*   No results for input(s):  LIPASE, AMYLASE in the last 168 hours. No results for input(s): AMMONIA in the last 168 hours. CBC:  Recent Labs Lab 06/28/15 0225 06/28/15 1101 06/30/15 0358  WBC 18.9* 16.6* 15.4*  NEUTROABS  --  13.0* 12.0*  HGB 9.0* 7.8* 7.8*  HCT 25.7* 22.2* 22.6*  MCV 91.5 92.1 94.6  PLT 492* 380 437*   Cardiac Enzymes: No results for input(s): CKTOTAL, CKMB, CKMBINDEX, TROPONINI in the last 168 hours. BNP (last 3 results) No results for input(s): BNP in the last 8760 hours.  ProBNP (last 3 results) No results for input(s): PROBNP in the last 8760 hours.  CBG: No results for input(s): GLUCAP in the last 168 hours.  No results found for this or any previous visit (from the past 240 hour(s)).   Studies: Ir Fluoro Guide Cv Line Right  06/02/2015  CLINICAL DATA:  Sickle cell disease EXAM: RIGHT UPPER EXTREMITY PICC LINE PLACEMENT WITH ULTRASOUND AND FLUOROSCOPIC GUIDANCE FLUOROSCOPY TIME:  36 seconds PROCEDURE: The patient was advised of the possible risks and complications and agreed to undergo the procedure. The patient was then brought to the angiographic suite for the procedure. The right arm was prepped with chlorhexidine, draped in the usual sterile fashion using maximum barrier technique (cap and mask, sterile gown, sterile gloves, large sterile sheet, hand hygiene and cutaneous antisepsis) and infiltrated locally with 1% Lidocaine. Ultrasound demonstrated patency of the right basilic vein, and this was documented with an image. Under real-time ultrasound guidance, this vein was accessed with a 21 gauge micropuncture needle and image documentation was performed. A 0.018 wire was introduced in to the vein. Over this, a 5 [place] double lumen power PICC was advanced to the lower SVC/right atrial junction. Fluoroscopy during the procedure and fluoro spot radiograph confirms appropriate catheter position. The catheter was flushed and covered with a sterile dressing. COMPLICATIONS: None LENGTH: 37  cm IMPRESSION: Successful right arm power PICC line placement with ultrasound and fluoroscopic guidance. The catheter is ready for use. Electronically Signed   By: Marybelle Killings M.D.   On: 06/02/2015 15:21   Ir US Guide Vasc Access Right  06/02/2015  CLINICAL DATA:  Sickle cell disease EXAM: RIGHT UPPER EXTREMITY PICC LINE PLACEMENT WITH ULTRASOUND AND FLUOROSCOPIC GUIDANCE FLUOROSCOPY TIME:  36 seconds PROCEDURE: The patient was advised of the possible risks and complications and agreed to undergo the procedure. The patient was then brought to the angiographic suite for the procedure. The right arm was prepped with chlorhexidine, draped in the usual sterile fashion using maximum barrier technique (cap and mask, sterile gown, sterile gloves, large sterile sheet, hand hygiene and cutaneous antisepsis) and infiltrated locally with 1% Lidocaine. Ultrasound demonstrated patency of the right basilic vein, and this was documented with an image. Under real-time ultrasound guidance, this vein was accessed with a 21 gauge micropuncture  needle and image documentation was performed. A 0.018 wire was introduced in to the vein. Over this, a 5 [place] double lumen power PICC was advanced to the lower SVC/right atrial junction. Fluoroscopy during the procedure and fluoro spot radiograph confirms appropriate catheter position. The catheter was flushed and covered with a sterile dressing. COMPLICATIONS: None LENGTH: 37 cm IMPRESSION: Successful right arm power PICC line placement with ultrasound and fluoroscopic guidance. The catheter is ready for use. Electronically Signed   By: Marybelle Killings M.D.   On: 06/02/2015 15:21    Scheduled Meds: . enoxaparin (LOVENOX) injection  40 mg Subcutaneous Daily  . folic acid  2 mg Oral Daily  . HYDROmorphone   Intravenous 6 times per day  . methadone  5 mg Oral QHS  . multivitamin with minerals  2 tablet Oral Daily  . oxyCODONE  15 mg Oral Q4H  . senna-docusate  1 tablet Oral BID  .  sodium chloride flush  3 mL Intravenous Q12H   Continuous Infusions: . sodium chloride 10 mL/hr at 06/30/15 0155   Time spent 25 minutes.

## 2015-06-30 NOTE — Procedures (Signed)
Placement of left brachial PICC.  Tip at SVC/RA junction.  Length = 43 cm.

## 2015-06-30 NOTE — Progress Notes (Signed)
Called over to assess FHR . Pt [redacted]w[redacted]d, FHR doppler151.

## 2015-06-30 NOTE — Discharge Summary (Signed)
[name] MRN: [id number] DOB/AGE: [birth year] 25 y.o.  Admit date: 04/14/2015 Discharge date: 06/30/2015  Primary Care Physician:  Angelica Chessman, MD   Discharge Diagnoses:   Patient Active Problem List   Diagnosis Date Noted  . Mood swings ([place]) 11/08/2011    Priority: High  . Depression 01/06/2011    Priority: High  . Sickle cell disease ([place]) 01/08/2009    Priority: High  . Hb-SS disease with crisis ([place]) 06/28/2015  . Anemia of chronic disease   . Leukocytosis   . Second trimester fetus   . Migraines 11/08/2011  . GERD (gastroesophageal reflux disease) 02/17/2011  . Trichotillomania 01/08/2009    DISCHARGE MEDICATION:   Medication List    STOP taking these medications        aspirin-acetaminophen-caffeine 250-250-65 MG tablet  Commonly known as:  [place] taking these medications        folic acid 1 MG tablet  Commonly known as:  FOLVITETake 2 tablets (2 mg total) by mouth daily.  Take 2 tablets (2 mg total) by mouth daily.    methadone 5 MG tablet  Commonly known as:  DOLOPHINE  Take 1 tablet (5 mg) by mouth daily at night  ____________________________________________________________________  oxyCODONE 15 MG immediate release tablet  Commonly known as:  ROXICODONE  Take 1 tablet (15 mg) by mouth every 4 hours as needed for pain          Consults:     SIGNIFICANT DIAGNOSTIC STUDIES:  Ct Head Wo Contrast  04/16/2015 CLINICAL DATA: [place] tonight with loss of consciousness. Amnestic for the event. Now with supraorbital pain on the left. EXAM: CT HEAD WITHOUT CONTRAST TECHNIQUE: Contiguous axial images were obtained from the base of the skull through the vertex without intravenous contrast. COMPARISON: None. FINDINGS: There is no intracranial hemorrhage, mass or evidence of acute infarction. There is no extra-axial fluid collection. Gray matter and white matter appear normal. Cerebral volume is normal for age.  Brainstem and posterior fossa are unremarkable. The CSF spaces appear normal. The bony structures are intact. The visible portions of the paranasal sinuses are clear. IMPRESSION: Normal brain Electronically Signed By: Andreas Newport M.D. On: 04/16/2015 23:05         No results found for this or any previous visit (from the past 240 hour(s)).  BRIEF ADMITTING H & P: Inez Eddington Deitz is a 25 y.o. female with history of sickle cell disease, chronic pain, opioid tolerant, major depression, GERD and chronic migraine headaches was initially seen and evaluated in the ED this morning, and sent to the day hospital for extended observation and pain management. Patient was admitted recently to the hospital for intractable pain, discharged within the last week. Patient claims her pain as a 10 out of 10, mostly on her right leg and knee joints, she denies any fever, she denies headache or blurry vision. Patient is currently in the early stage of pregnancy. Patient claims compliant with her medications, but has been asking series of questions concerning her dosage, she wants her methadone increased from 5-10 mg and/or increase her oxycodone 15 mg to one and half tablets every 4 hours. 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 She denies any chills, no runny nose, no productive cough, denies abdominal pain, no nausea, no vomiting, no diarrhea. She denies any urinary symptoms.  [name] was admitted  to the day hospital with sickle cell painful crisis. She had very difficulty in establishing IV access, the IV team from the hospital could not establish a line. She had IV line on the left leg after failed trials. She was treated with rapid dosing dilaudid IV, weight based IV Dilaudid and PCA, and 2 L of IV fluids D5.45 Saline. [place] showed no significant improvement symptomatically, she ambulates with a limp, she still rates her pain at 8/10, she  desires to be admitted overnight for continuous IV fluid and pain management. Patient will be admitted to inpatient MedSurg floor.   Hospital Course:  Present on Admission:  Hb SS with crisis: Pt was admitted from the [facility]. She was treated with Dilaudid via PCA and clinician assisted doses of Dilaudid by IVP. Due to her state of pregnancy, she was unable to receive Toradol. He pain was refractory to the pain medications which I felt was worsened by her state of pregnancy and high oxygen requirement of the fetus. A review of her records showed that her last Hb S% documented on 03/31/2015 was 81.7%. She received a partial exchange transfusion after which she improved considerable. I have made a recommendation that her Hb levels be kept above 8 and Hb S% below 30% in order to ensure adequate fetal growth and minimize crises during the pregnancy.   Anemia of Chronic Disease: Pt was at her baseline Hb however in light of her pregnancy, she received a partial exchange transfusion of 1 unit in exchange of 300 ml of blood.   Leukocytosis: Pt had a mildly elevated WBC count but no evidence of infection. It was felt to be attributed to the crisis and concurrent inflammatory process.   S/P Fall: Pt had a fall during this hospitalization. She was assessed at the time and found to have no acute changes or injuries. She had no further complaints or sequela from the fall throughout the remainder of her hospital stay.  Disposition and Follow-up:  Pt is being discharged home in good condition. She is to pick up her prescription from the [facility] for her pain medications. Follow up with [facility] as scheduled.     Discharge Instructions    Activity as tolerated - No restrictions    Complete by:  As directed      Diet general    Complete by:  As directed            DISCHARGE EXAM:  General: Alert, awake, oriented x3, in no apparent distress. Vital Signs: BP 110/68, HR 82, T 98.6 F (37 C),  temperature source Oral,RR 16, height 5\' 1"  (1.549 m), weight 172 lb 1.6 oz (78.064 kg), last menstrual period 02/09/2015, SpO2 98 %, not currently breastfeeding. HEENT: [place]/AT PEERL, EOMI, anicteric Neck: Trachea midline, no masses, no thyromegal,y no JVD, no carotid bruit OROPHARYNX: Moist, No exudate/ erythema/lesions.  Heart: Regular rate and rhythm, without murmurs, rubs, gallops or S3. PMI non-displaced. Exam reveals no decreased pulses. Pulmonary/Chest: Normal effort. Breath sounds normal. No. Apnea. Clear to auscultation,no stridor,  no wheezing and no rhonchi noted. No respiratory distress and no tenderness noted. Abdomen: Soft, nontender, nondistended, normal bowel sounds, no masses no hepatosplenomegaly noted. No fluid wave and no ascites. There is no guarding or rebound. Neuro: Alert and oriented to person, place and time. Normal motor skills, Displays no atrophy or tremors and exhibits normal muscle tone.  No focal neurological deficits noted cranial nerves II through XII grossly intact. No sensory deficit noted.  Strength at baseline in bilateral upper and lower extremities. Gait normal. Musculoskeletal: No warm swelling or erythema around joints, no spinal tenderness noted. Psychiatric: Patient alert and oriented x3, good insight and cognition, good recent to remote recall. Mood, memory, affect and judgement normal Lymph node survey: No cervical axillary or inguinal lymphadenopathy noted. Skin: Skin is warm and dry. No bruising, no ecchymosis and no rash noted. Pt is not diaphoretic. No erythema. No pallor    Total time spent including face to face and decision making was greater than 30 minutes  Signed: MATTHEWS,MICHELLE A. 04/29/2015, 10:07 AM

## 2015-07-01 ENCOUNTER — Encounter (HOSPITAL_COMMUNITY): Payer: Self-pay | Admitting: *Deleted

## 2015-07-01 LAB — HEMOGLOBINOPATHY EVALUATION
Hgb A2 Quant: 4.5 % — ABNORMAL HIGH (ref 0.7–3.1)
Hgb A: 0 % — ABNORMAL LOW (ref 94.0–98.0)
Hgb C: 0 %
Hgb F Quant: 11.5 % — ABNORMAL HIGH (ref 0.0–2.0)
Hgb S Quant: 84 % — ABNORMAL HIGH

## 2015-07-01 MED ORDER — SODIUM CHLORIDE 0.9 % IV SOLN: Freq: Once | INTRAVENOUS | Status: DC

## 2015-07-01 NOTE — Progress Notes (Signed)
25 Spoke with Dr. Roselie Awkward regarding pregnancy dating for this patient.  Early [place] dating will be used.  Pregnancy is non-viable at this time.  [facility] RN will continue to perform doppler FHT's. Per Dr. Roselie Awkward there is not a need at this time for a formal OB US as one was performed on 05/02/15.  Patient will receive formal anatomy screening US on an outpatient basis.

## 2015-07-01 NOTE — Progress Notes (Signed)
SICKLE CELL SERVICE PROGRESS NOTE  [name] [id number] DOB: [birth year] DOA: 06/28/2015 PCP: Angelica Chessman, MD  Assessment/Plan: Active Problems:   Hb-SS disease with crisis ([place])   Anemia of chronic disease   Leukocytosis   Second trimester fetus  1. Hb SS with crisis: Pt reports that pain is localized mainly to right knee and thigh. She feels that she is about  50% of the way to being able to manage her pain with oral analgesics. She has used 21.2 mg with 33/32:demands/deliveries in addition to 12 mg  the last 23 hours. I will decrease frequency of clinician assisted doses to every 3 hours as needed. Re-assess to wean further tomorrow.  2. Leukocytosis: Likely secondary to crisis. Pt has no overt clinical signs of infection. 3. Anemia of chronic disease: Hb stable at 7.8 g/dl. I have reviewed her Electrophoresis from 03/31/2015 and at that time her  of HB S was 81.7.%.  She has received 1  transfusion of 1 unit RBC's since then. I will do a partial exchange transfusion 1 unit of RBC to ensure adequate fetal growth. Goal during pregnancy is to keep Hb S <30% and Hb levels >8.Hemoglobin electrophoresis in process. Pt enquiring who will do her partial exchange transfusions during pregnancy. I have made NP [place] Hollis aware of patient's concern.  4. Chronic pain: Continue Methadone 5 mg q HS. 5. 2nd Trimester Pregnancy: Pt initially intended to terminate the pregnancy but has since changed her mind and plans to take the pregnancy to term. She has not yet had her initial visit for pre-natal care but is scheduled for Ob-Gyn visit on 07/01/2014. Ob-Rapid response team appreciated. Approximate GA [redacted] weeks. 6. Constipation: Pt has not had a BM in 3 days. Will ask nurse to give Miralax today.  Code Status: Full Code Family Communication: N/A Disposition Plan: Not yet ready for discharge  [place].  Pager [phone]. If 7PM-7AM, please contact night-coverage.  07/01/2015, 1:34  PM  LOS: 3 days   Interim History: Pain improving slowly.   Consultants:  None  Procedures:  None  Antibiotics:  None    Objective: Filed Vitals:   07/01/15 0503 07/01/15 0749 07/01/15 0757 07/01/15 1324  BP: 91/59 117/66  115/68  Pulse: 92 95  99  Temp: 98.5 F (36.9 C)   98.8 F (37.1 C)  TempSrc: Oral   Oral  Resp: 14 16 14 18   Height:      Weight:      SpO2: 98%  96% 98%   Weight change:   Intake/Output Summary (Last 24 hours) at 07/01/15 1334 Last data filed at 06/30/15 2232  Gross per 24 hour  Intake      0 ml  Output   1000 ml  Net  -1000 ml    General: Alert, awake, oriented x3, in mild distress due to pain.  HEENT: [place]/AT PEERL, EOMI, anicteric Neck: Trachea midline,  no masses, no thyromegal,y no JVD, no carotid bruit OROPHARYNX:  Moist, No exudate/ erythema/lesions.  Heart: Regular rate and rhythm, without murmurs, rubs, gallops, PMI non-displaced, no heaves or thrills on palpation.  Lungs: Clear to auscultation, no wheezing or rhonchi noted. No increased vocal fremitus resonant to percussion  Abdomen: Gravid Neuro: No focal neurological deficits noted cranial nerves II through XII grossly intact.  Strength at functional baseline in bilateral upper and lower extremities. Musculoskeletal: No warm swelling or erythema around joints, no spinal tenderness noted. Psychiatric: Patient alert and oriented x3, good insight and cognition,  good recent to remote recall.    Data Reviewed: Basic Metabolic Panel:  Recent Labs Lab 06/28/15 1101 06/30/15 0358  NA 137 138  K 3.6 4.1  CL 108 106  CO2 21* 24  GLUCOSE 90 94  BUN 5* 6  CREATININE 0.31* 0.40*  CALCIUM 8.3* 8.7*   Liver Function Tests:  Recent Labs Lab 06/28/15 1101  AST 20  ALT 13*  ALKPHOS 48  BILITOT 2.6*  PROT 6.1*  ALBUMIN 3.1*   No results for input(s): LIPASE, AMYLASE in the last 168 hours. No results for input(s): AMMONIA in the last 168 hours. CBC:  Recent Labs Lab  06/28/15 0225 06/28/15 1101 06/30/15 0358  WBC 18.9* 16.6* 15.4*  NEUTROABS  --  13.0* 12.0*  HGB 9.0* 7.8* 7.8*  HCT 25.7* 22.2* 22.6*  MCV 91.5 92.1 94.6  PLT 492* 380 437*   Cardiac Enzymes: No results for input(s): CKTOTAL, CKMB, CKMBINDEX, TROPONINI in the last 168 hours. BNP (last 3 results) No results for input(s): BNP in the last 8760 hours.  ProBNP (last 3 results) No results for input(s): PROBNP in the last 8760 hours.  CBG: No results for input(s): GLUCAP in the last 168 hours.  No results found for this or any previous visit (from the past 240 hour(s)).   Studies: Ir Fluoro Guide Cv Line Left  06/30/2015  INDICATION: 25 year old with sickle cell crisis and needs central venous access. The patient is pregnant. Patient's abdomen was shielded for this procedure. EXAM: PICC LINE PLACEMENT WITH ULTRASOUND AND FLUOROSCOPIC GUIDANCE MEDICATIONS: None ANESTHESIA/SEDATION: None FLUOROSCOPY TIME:  Fluoroscopy Time: 6 seconds.  0.6 mGy COMPLICATIONS: None immediate. PROCEDURE: The patient was advised of the possible risks and complications and agreed to undergo the procedure. In particular, the risks of radiation exposure and pregnancy were discussed the patient. The patient was then brought to the angiographic suite for the procedure. The right arm was prepped with chlorhexidine, draped in the usual sterile fashion using maximum barrier technique (cap and mask, sterile gown, sterile gloves, large sterile sheet, hand hygiene and cutaneous antiseptic). Local anesthesia was attained by infiltration with 1% lidocaine. Complex venous anatomy in the right upper arm. Patient may have an early radial artery takeoff. Attempted to cannulate multiple veins in the right upper arm with ultrasound guidance but these attempts were unsuccessful. Therefore, attention was directed to the left arm. Left arm was prepped and draped in sterile fashion. Ultrasound demonstrated patency of the left brachial vein,  and this was documented with an image. Under real-time ultrasound guidance, this vein was accessed with a 21 gauge micropuncture needle and image documentation was performed. The needle was exchanged over a guidewire for a peel-away sheath through which a 43 cm 5 [place] dual lumen power injectable PICC was advanced, and positioned with its tip at the lower SVC/right atrial junction. Fluoroscopy during the procedure and fluoro spot radiograph confirms appropriate catheter position. The catheter was flushed, secured to the skin, and covered with a sterile dressing. IMPRESSION: Successful placement of a left arm PICC with sonographic and fluoroscopic guidance. The catheter is ready for use. Electronically Signed   By: Markus Daft M.D.   On: 06/30/2015 17:27   Ir Fluoro Guide Cv Line Right  06/02/2015  CLINICAL DATA:  Sickle cell disease EXAM: RIGHT UPPER EXTREMITY PICC LINE PLACEMENT WITH ULTRASOUND AND FLUOROSCOPIC GUIDANCE FLUOROSCOPY TIME:  36 seconds PROCEDURE: The patient was advised of the possible risks and complications and agreed to undergo the procedure. The patient was  then brought to the angiographic suite for the procedure. The right arm was prepped with chlorhexidine, draped in the usual sterile fashion using maximum barrier technique (cap and mask, sterile gown, sterile gloves, large sterile sheet, hand hygiene and cutaneous antisepsis) and infiltrated locally with 1% Lidocaine. Ultrasound demonstrated patency of the right basilic vein, and this was documented with an image. Under real-time ultrasound guidance, this vein was accessed with a 21 gauge micropuncture needle and image documentation was performed. A 0.018 wire was introduced in to the vein. Over this, a 5 [place] double lumen power PICC was advanced to the lower SVC/right atrial junction. Fluoroscopy during the procedure and fluoro spot radiograph confirms appropriate catheter position. The catheter was flushed and covered with a sterile  dressing. COMPLICATIONS: None LENGTH: 37 cm IMPRESSION: Successful right arm power PICC line placement with ultrasound and fluoroscopic guidance. The catheter is ready for use. Electronically Signed   By: Marybelle Killings M.D.   On: 06/02/2015 15:21   Ir US Guide Vasc Access Left  06/30/2015  INDICATION: 25 year old with sickle cell crisis and needs central venous access. The patient is pregnant. Patient's abdomen was shielded for this procedure. EXAM: PICC LINE PLACEMENT WITH ULTRASOUND AND FLUOROSCOPIC GUIDANCE MEDICATIONS: None ANESTHESIA/SEDATION: None FLUOROSCOPY TIME:  Fluoroscopy Time: 6 seconds.  0.6 mGy COMPLICATIONS: None immediate. PROCEDURE: The patient was advised of the possible risks and complications and agreed to undergo the procedure. In particular, the risks of radiation exposure and pregnancy were discussed the patient. The patient was then brought to the angiographic suite for the procedure. The right arm was prepped with chlorhexidine, draped in the usual sterile fashion using maximum barrier technique (cap and mask, sterile gown, sterile gloves, large sterile sheet, hand hygiene and cutaneous antiseptic). Local anesthesia was attained by infiltration with 1% lidocaine. Complex venous anatomy in the right upper arm. Patient may have an early radial artery takeoff. Attempted to cannulate multiple veins in the right upper arm with ultrasound guidance but these attempts were unsuccessful. Therefore, attention was directed to the left arm. Left arm was prepped and draped in sterile fashion. Ultrasound demonstrated patency of the left brachial vein, and this was documented with an image. Under real-time ultrasound guidance, this vein was accessed with a 21 gauge micropuncture needle and image documentation was performed. The needle was exchanged over a guidewire for a peel-away sheath through which a 43 cm 5 [place] dual lumen power injectable PICC was advanced, and positioned with its tip at the  lower SVC/right atrial junction. Fluoroscopy during the procedure and fluoro spot radiograph confirms appropriate catheter position. The catheter was flushed, secured to the skin, and covered with a sterile dressing. IMPRESSION: Successful placement of a left arm PICC with sonographic and fluoroscopic guidance. The catheter is ready for use. Electronically Signed   By: Markus Daft M.D.   On: 06/30/2015 17:27   Ir US Guide Vasc Access Right  06/02/2015  CLINICAL DATA:  Sickle cell disease EXAM: RIGHT UPPER EXTREMITY PICC LINE PLACEMENT WITH ULTRASOUND AND FLUOROSCOPIC GUIDANCE FLUOROSCOPY TIME:  36 seconds PROCEDURE: The patient was advised of the possible risks and complications and agreed to undergo the procedure. The patient was then brought to the angiographic suite for the procedure. The right arm was prepped with chlorhexidine, draped in the usual sterile fashion using maximum barrier technique (cap and mask, sterile gown, sterile gloves, large sterile sheet, hand hygiene and cutaneous antisepsis) and infiltrated locally with 1% Lidocaine. Ultrasound demonstrated patency of the right basilic vein,  and this was documented with an image. Under real-time ultrasound guidance, this vein was accessed with a 21 gauge micropuncture needle and image documentation was performed. A 0.018 wire was introduced in to the vein. Over this, a 5 [place] double lumen power PICC was advanced to the lower SVC/right atrial junction. Fluoroscopy during the procedure and fluoro spot radiograph confirms appropriate catheter position. The catheter was flushed and covered with a sterile dressing. COMPLICATIONS: None LENGTH: 37 cm IMPRESSION: Successful right arm power PICC line placement with ultrasound and fluoroscopic guidance. The catheter is ready for use. Electronically Signed   By: Marybelle Killings M.D.   On: 06/02/2015 15:21    Scheduled Meds: . sodium chloride   Intravenous Once  . sodium chloride   Intravenous Once  .  enoxaparin (LOVENOX) injection  40 mg Subcutaneous Daily  . folic acid  2 mg Oral Daily  . HYDROmorphone   Intravenous 6 times per day  . methadone  5 mg Oral QHS  . multivitamin with minerals  2 tablet Oral Daily  . senna-docusate  1 tablet Oral BID  . sodium chloride flush  3 mL Intravenous Q12H   Continuous Infusions: . sodium chloride 10 mL/hr at 07/01/15 0757   Time spent 25 minutes.

## 2015-07-01 NOTE — Care Management Important Message (Signed)
Important Message  Patient Details  Name: [name] MRN: [id number] Date of Birth: 28-Oct-1990   Medicare Important Message Given:  Yes    Camillo Flaming 07/01/2015, 11:13 AMImportant Message  Patient Details  Name: [name] MRN: [id number] Date of Birth: [birth year]   Medicare Important Message Given:  Yes    Camillo Flaming 07/01/2015, 11:13 AM

## 2015-07-02 ENCOUNTER — Encounter: Payer: Medicare Other | Admitting: Obstetrics & Gynecology

## 2015-07-02 ENCOUNTER — Telehealth: Payer: Self-pay | Admitting: Internal Medicine

## 2015-07-02 LAB — CBC WITH DIFFERENTIAL/PLATELET
BAND NEUTROPHILS: 0 %
BLASTS: 0 %
Basophils Absolute: 0 10*3/uL (ref 0.0–0.1)
Basophils Relative: 0 %
Eosinophils Absolute: 0.3 10*3/uL (ref 0.0–0.7)
Eosinophils Relative: 2 %
HEMATOCRIT: 24.1 % — AB (ref 36.0–46.0)
HEMOGLOBIN: 8.3 g/dL — AB (ref 12.0–15.0)
LYMPHS PCT: 18 %
Lymphs Abs: 2.3 10*3/uL (ref 0.7–4.0)
MCH: 32.3 pg (ref 26.0–34.0)
MCHC: 34.4 g/dL (ref 30.0–36.0)
MCV: 93.8 fL (ref 78.0–100.0)
MONOS PCT: 5 %
Metamyelocytes Relative: 0 %
Monocytes Absolute: 0.6 10*3/uL (ref 0.1–1.0)
Myelocytes: 0 %
NEUTROS ABS: 9.6 10*3/uL — AB (ref 1.7–7.7)
NEUTROS PCT: 75 %
NRBC: 8 /100{WBCs} — AB
OTHER: 0 %
Platelets: 392 10*3/uL (ref 150–400)
Promyelocytes Absolute: 0 %
RBC: 2.57 MIL/uL — AB (ref 3.87–5.11)
RDW: 17.8 % — AB (ref 11.5–15.5)
WBC: 12.8 10*3/uL — AB (ref 4.0–10.5)

## 2015-07-02 LAB — TYPE AND SCREEN
ABO/RH(D): B POS
ANTIBODY SCREEN: NEGATIVE
Donor AG Type: NEGATIVE
UNIT DIVISION: 0

## 2015-07-02 MED ORDER — OXYCODONE HCL 5 MG PO TABS
15.0000 mg | ORAL_TABLET | ORAL | Status: DC
  Administered 2015-07-02 – 2015-07-03 (×5): 15 mg via ORAL
  Filled 2015-07-02 (×5): qty 3

## 2015-07-02 NOTE — Progress Notes (Signed)
IVF KVO @10ml /hr, pt bag changed early, pt suspected changing rate on alaris pump.   Pump was locked. PCA syringe replaced and pump was found unlocked.   NT witness pump rate 811ml/hr.  Will continue to monitor.

## 2015-07-02 NOTE — Telephone Encounter (Signed)
Refill request for oxycodone 15mg  and methadone 5mg  lov is 05/29/2015 Please advise. Thanks!

## 2015-07-02 NOTE — Progress Notes (Signed)
SICKLE CELL SERVICE PROGRESS NOTE  [name] [id number] DOB: [birth year] DOA: 06/28/2015 PCP: Angelica Chessman, MD  Assessment/Plan: Active Problems:   Hb-SS disease with crisis ([place])   Anemia of chronic disease   Leukocytosis   Second trimester fetus  1. Hb SS with crisis: Pt reports that pain is localized mainly to right knee and thigh. She feels that she is having less pain today.  I will schedule oxycodone and discontinue PCA. Continue clinician assisted doses on a PRN basis every 3 hours as needed.Anticipate discharge home tomorrow.  2. Leukocytosis: Improved. Likely secondary to crisis. Pt has no overt clinical signs of infection. 3. Anemia of chronic disease: Hb stable at 8.3 g/dl post transfusion of 1 unit RBC.  Electrophoresis from 06/29/2015 shows  HB S is  84.%.  4. Chronic pain: Continue Methadone 5 mg q HS. 5. 2nd Trimester Pregnancy: Pt initially intended to terminate the pregnancy but has since changed her mind and plans to take the pregnancy to term. She has not yet had her initial visit for pre-natal care but was scheduled for Ob-Gyn visit on 24/16/2016. Ob-Rapid response team appreciated. Approximate GA [redacted] weeks. 6. Constipation: Pt had a BM after MiraLAX today.  Code Status: Full Code Family Communication: N/A Disposition Plan: Not yet ready for discharge  [place].  Pager [phone]. If 7PM-7AM, please contact night-coverage.  07/02/2015, 12:56 PM  LOS: 4 days   Interim History: Pain improving   Consultants:  None  Procedures:  None  Antibiotics:  None    Objective: Filed Vitals:   07/02/15 0612 07/02/15 0809 07/02/15 1111 07/02/15 1228  BP: 107/57  108/62   Pulse: 83  82   Temp: 97.4 F (36.3 C)  97.8 F (36.6 C)   TempSrc: Oral  Oral   Resp: 13 14 18 14   Height:      Weight: 176 lb 2.4 oz (79.9 kg)     SpO2: 95% 95% 96% 95%   Weight change:   Intake/Output Summary (Last 24 hours) at 07/02/15 1256 Last data filed at  07/02/15 0004  Gross per 24 hour  Intake    695 ml  Output   1050 ml  Net   -355 ml    General: Alert, awake, oriented x3, in no apparent distress.  HEENT: [place]/AT PEERL, EOMI, anicteric Neck: Trachea midline,  no masses, no thyromegal,y no JVD, no carotid bruit OROPHARYNX:  Moist, No exudate/ erythema/lesions.  Heart: Regular rate and rhythm, without murmurs, rubs, gallops, PMI non-displaced, no heaves or thrills on palpation.  Lungs: Clear to auscultation, no wheezing or rhonchi noted. No increased vocal fremitus resonant to percussion  Abdomen: Gravid Neuro: No focal neurological deficits noted cranial nerves II through XII grossly intact.  Strength at functional baseline in bilateral upper and lower extremities. Musculoskeletal: No warm swelling or erythema around joints, no spinal tenderness noted. Psychiatric: Patient alert and oriented x3, good insight and cognition, good recent to remote recall.    Data Reviewed: Basic Metabolic Panel:  Recent Labs Lab 06/28/15 1101 06/30/15 0358  NA 137 138  K 3.6 4.1  CL 108 106  CO2 21* 24  GLUCOSE 90 94  BUN 5* 6  CREATININE 0.31* 0.40*  CALCIUM 8.3* 8.7*   Liver Function Tests:  Recent Labs Lab 06/28/15 1101  AST 20  ALT 13*  ALKPHOS 48  BILITOT 2.6*  PROT 6.1*  ALBUMIN 3.1*   No results for input(s): LIPASE, AMYLASE in the last 168 hours. No results for  input(s): AMMONIA in the last 168 hours. CBC:  Recent Labs Lab 06/28/15 0225 06/28/15 1101 06/30/15 0358 07/02/15 1020  WBC 18.9* 16.6* 15.4* 12.8*  NEUTROABS  --  13.0* 12.0* 9.6*  HGB 9.0* 7.8* 7.8* 8.3*  HCT 25.7* 22.2* 22.6* 24.1*  MCV 91.5 92.1 94.6 93.8  PLT 492* 380 437* 392   Cardiac Enzymes: No results for input(s): CKTOTAL, CKMB, CKMBINDEX, TROPONINI in the last 168 hours. BNP (last 3 results) No results for input(s): BNP in the last 8760 hours.  ProBNP (last 3 results) No results for input(s): PROBNP in the last 8760 hours.  CBG: No  results for input(s): GLUCAP in the last 168 hours.  No results found for this or any previous visit (from the past 240 hour(s)).   Studies: Ir Fluoro Guide Cv Line Left  06/30/2015  INDICATION: 25 year old with sickle cell crisis and needs central venous access. The patient is pregnant. Patient's abdomen was shielded for this procedure. EXAM: PICC LINE PLACEMENT WITH ULTRASOUND AND FLUOROSCOPIC GUIDANCE MEDICATIONS: None ANESTHESIA/SEDATION: None FLUOROSCOPY TIME:  Fluoroscopy Time: 6 seconds.  0.6 mGy COMPLICATIONS: None immediate. PROCEDURE: The patient was advised of the possible risks and complications and agreed to undergo the procedure. In particular, the risks of radiation exposure and pregnancy were discussed the patient. The patient was then brought to the angiographic suite for the procedure. The right arm was prepped with chlorhexidine, draped in the usual sterile fashion using maximum barrier technique (cap and mask, sterile gown, sterile gloves, large sterile sheet, hand hygiene and cutaneous antiseptic). Local anesthesia was attained by infiltration with 1% lidocaine. Complex venous anatomy in the right upper arm. Patient may have an early radial artery takeoff. Attempted to cannulate multiple veins in the right upper arm with ultrasound guidance but these attempts were unsuccessful. Therefore, attention was directed to the left arm. Left arm was prepped and draped in sterile fashion. Ultrasound demonstrated patency of the left brachial vein, and this was documented with an image. Under real-time ultrasound guidance, this vein was accessed with a 21 gauge micropuncture needle and image documentation was performed. The needle was exchanged over a guidewire for a peel-away sheath through which a 43 cm 5 [place] dual lumen power injectable PICC was advanced, and positioned with its tip at the lower SVC/right atrial junction. Fluoroscopy during the procedure and fluoro spot radiograph confirms  appropriate catheter position. The catheter was flushed, secured to the skin, and covered with a sterile dressing. IMPRESSION: Successful placement of a left arm PICC with sonographic and fluoroscopic guidance. The catheter is ready for use. Electronically Signed   By: Markus Daft M.D.   On: 06/30/2015 17:27   Ir US Guide Vasc Access Left  06/30/2015  INDICATION: 25 year old with sickle cell crisis and needs central venous access. The patient is pregnant. Patient's abdomen was shielded for this procedure. EXAM: PICC LINE PLACEMENT WITH ULTRASOUND AND FLUOROSCOPIC GUIDANCE MEDICATIONS: None ANESTHESIA/SEDATION: None FLUOROSCOPY TIME:  Fluoroscopy Time: 6 seconds.  0.6 mGy COMPLICATIONS: None immediate. PROCEDURE: The patient was advised of the possible risks and complications and agreed to undergo the procedure. In particular, the risks of radiation exposure and pregnancy were discussed the patient. The patient was then brought to the angiographic suite for the procedure. The right arm was prepped with chlorhexidine, draped in the usual sterile fashion using maximum barrier technique (cap and mask, sterile gown, sterile gloves, large sterile sheet, hand hygiene and cutaneous antiseptic). Local anesthesia was attained by infiltration with 1% lidocaine. Complex venous  anatomy in the right upper arm. Patient may have an early radial artery takeoff. Attempted to cannulate multiple veins in the right upper arm with ultrasound guidance but these attempts were unsuccessful. Therefore, attention was directed to the left arm. Left arm was prepped and draped in sterile fashion. Ultrasound demonstrated patency of the left brachial vein, and this was documented with an image. Under real-time ultrasound guidance, this vein was accessed with a 21 gauge micropuncture needle and image documentation was performed. The needle was exchanged over a guidewire for a peel-away sheath through which a 43 cm 5 [place] dual lumen power  injectable PICC was advanced, and positioned with its tip at the lower SVC/right atrial junction. Fluoroscopy during the procedure and fluoro spot radiograph confirms appropriate catheter position. The catheter was flushed, secured to the skin, and covered with a sterile dressing. IMPRESSION: Successful placement of a left arm PICC with sonographic and fluoroscopic guidance. The catheter is ready for use. Electronically Signed   By: Markus Daft M.D.   On: 06/30/2015 17:27    Scheduled Meds: . sodium chloride   Intravenous Once  . sodium chloride   Intravenous Once  . enoxaparin (LOVENOX) injection  40 mg Subcutaneous Daily  . folic acid  2 mg Oral Daily  . methadone  5 mg Oral QHS  . multivitamin with minerals  2 tablet Oral Daily  . oxyCODONE  15 mg Oral Q4H  . senna-docusate  1 tablet Oral BID  . sodium chloride flush  3 mL Intravenous Q12H   Continuous Infusions: . sodium chloride 10 mL/hr at 07/02/15 0130   Time spent 25 minutes.

## 2015-07-02 NOTE — Care Management Note (Signed)
Case Management Note  Patient Details  Name: [name] MRN: [id number] Date of Birth: Jun 14, 1990  Subjective/Objective:    25 yo admitted with [facility]                Action/Plan: From home alone.  Expected Discharge Date:                  Expected Discharge Plan:  Home/Self Care  In-House Referral:     Discharge planning Services  CM Consult  Post Acute Care Choice:    Choice offered to:     DME Arranged:    DME Agency:     HH Arranged:    HH Agency:     Status of Service:  In process, will continue to follow  Medicare Important Message Given:  Yes Date Medicare IM Given:    Medicare IM give by:    Date Additional Medicare IM Given:    Additional Medicare Important Message give by:     If discussed at [place] of Stay Meetings, dates discussed:    Additional Comments: Chart reviewed and no CM needs identified or communicated at this time. CM will continue to follow. Marney Doctor RN,BSN,NCM [id number] Nancy Catalan, RN 07/02/2015, 2:01 PM

## 2015-07-03 ENCOUNTER — Other Ambulatory Visit: Payer: Self-pay | Admitting: Internal Medicine

## 2015-07-03 DIAGNOSIS — D571 Sickle-cell disease without crisis: Secondary | ICD-10-CM

## 2015-07-03 DIAGNOSIS — G8929 Other chronic pain: Secondary | ICD-10-CM

## 2015-07-03 MED ORDER — OXYCODONE HCL 15 MG PO TABS: 15.0000 mg | ORAL_TABLET | ORAL | Status: DC | PRN

## 2015-07-03 MED ORDER — METHADONE HCL 5 MG PO TABS: 5.0000 mg | ORAL_TABLET | Freq: Every day | ORAL | Status: DC

## 2015-07-03 MED FILL — oxyCODONE HCL 15 MG TABS: 15 | 15 days supply | Qty: 90 | Fill #0

## 2015-07-03 NOTE — Progress Notes (Signed)
Pt asking for an OBGYN appointment.  This CM printed out a list of OBGYNs in the area and left it with pt.  No other CM needs communicated. Marney Doctor, RN,BNS,NCM [phone]

## 2015-07-03 NOTE — Discharge Summary (Signed)
Physician Discharge Summary  Patient ID: [name] MRN: [id number] DOB/AGE: 09-18-1990 25 y.o.  Admit date: 06/28/2015 Discharge date: 07/03/2015  Admission Diagnoses:  Discharge Diagnoses:  Active Problems:   Hb-SS disease with crisis ([place])   Anemia of chronic disease   Leukocytosis   Second trimester fetus   Discharged Condition: good  Hospital Course: Patient was admitted with Sickle Cell painful crisis. She has 2nd trimester pregnancy that is doing well. She was treated with IV Dilaudid PCA, her methadone as well as Roxicodone. Patient has Leucocytosis. She has done well and has been transitioned to her home medications. She was seen and followed by OBGYN and Baby is fine. She will follow up with her PCP.  Consults: None  Significant Diagnostic Studies: labs: CBC, BMP checked all within normal limits.  Treatments: IV hydration and analgesia: Dilaudid  Discharge Exam: Blood pressure 104/62, pulse 82, temperature 98.8 F (37.1 C), temperature source Oral, resp. rate 14, height 5\' 2"  (1.575 m), weight 79.9 kg (176 lb 2.4 oz), last menstrual period 02/09/2015, SpO2 92 %, not currently breastfeeding. General appearance: alert, cooperative and no distress Head: Normocephalic, without obvious abnormality, atraumatic Eyes: conjunctivae/corneas clear. PERRL, EOM's intact. Fundi benign. Neck: no adenopathy, no carotid bruit, no JVD, supple, symmetrical, trachea midline and thyroid not enlarged, symmetric, no tenderness/mass/nodules Back: symmetric, no curvature. ROM normal. No CVA tenderness. Resp: clear to auscultation bilaterally Cardio: regular rate and rhythm, S1, S2 normal, no murmur, click, rub or gallop GI: soft, non-tender; bowel sounds normal; no masses,  no organomegaly Extremities: extremities normal, atraumatic, no cyanosis or edema Pulses: 2+ and symmetric Skin: Skin color, texture, turgor normal. No rashes or lesions Neurologic: Grossly normal  Disposition:  01-Home or Self Care     Medication List    STOP taking these medications        prenatal multivitamin Tabs tablet      TAKE these medications        acetaminophen 500 MG tablet  Commonly known as:  TYLENOL  Take 1,000 mg by mouth every 6 (six) hours as needed for moderate pain.     calcium carbonate 500 MG chewable tablet  Commonly known as:  TUMS - dosed in mg elemental calcium  Chew 3 tablets by mouth daily.     [employer] PRENATAL GUMMY 0.4-113.5 MG Chew  Chew 2-3 each by mouth daily.     folic acid 1 MG tablet  Commonly known as:  FOLVITE  Take 2 tablets (2 mg total) by mouth daily.     methadone 5 MG tablet  Commonly known as:  DOLOPHINE  Take 1 tablet (5 mg total) by mouth at bedtime.     oxyCODONE 15 MG immediate release tablet  Commonly known as:  ROXICODONE  Take 1 tablet (15 mg total) by mouth every 4 (four) hours as needed for pain.           Follow-up Information    Follow up with [facility].   Specialty:  Obstetrics and Gynecology   Why:  As scheduled, Return to MAU as needed for emergencies   Contact information:   [place] [phone]      Please follow up.   Why:  Sickle Cell Clinic as scheduled on 07/06/15      Signed: GARBA,LAWAL 07/03/2015, 10:19 AM  Time spent 33 minutes

## 2015-07-03 NOTE — Telephone Encounter (Signed)
Dr. Doreene Burke, Can you write rx for OXYCODNE 15mg  and METHADONE 5mg  for patient? She has been discharged today from hospital and is out of medication.  Oxycodone was last filled on 06/19/2015 at [facility] outpatient (I called and verified). LOV 05/29/2015 with [place]. Please advise. Thanks!

## 2015-07-03 NOTE — Progress Notes (Signed)
Pt discharged home in stable condition. Discharge instructions given. Pt verbalized understanding.  

## 2015-07-04 IMAGING — CR DG CHEST 2V
2 series · 2 of 2 positions shown · non-contrast
Comparison: 08/09/2014

CLINICAL DATA: Sickle cell crisis. Body aches and slight cough.
Patient is pregnant with abdominal shielding.

EXAM:
CHEST  2 VIEW

[w chest pa]
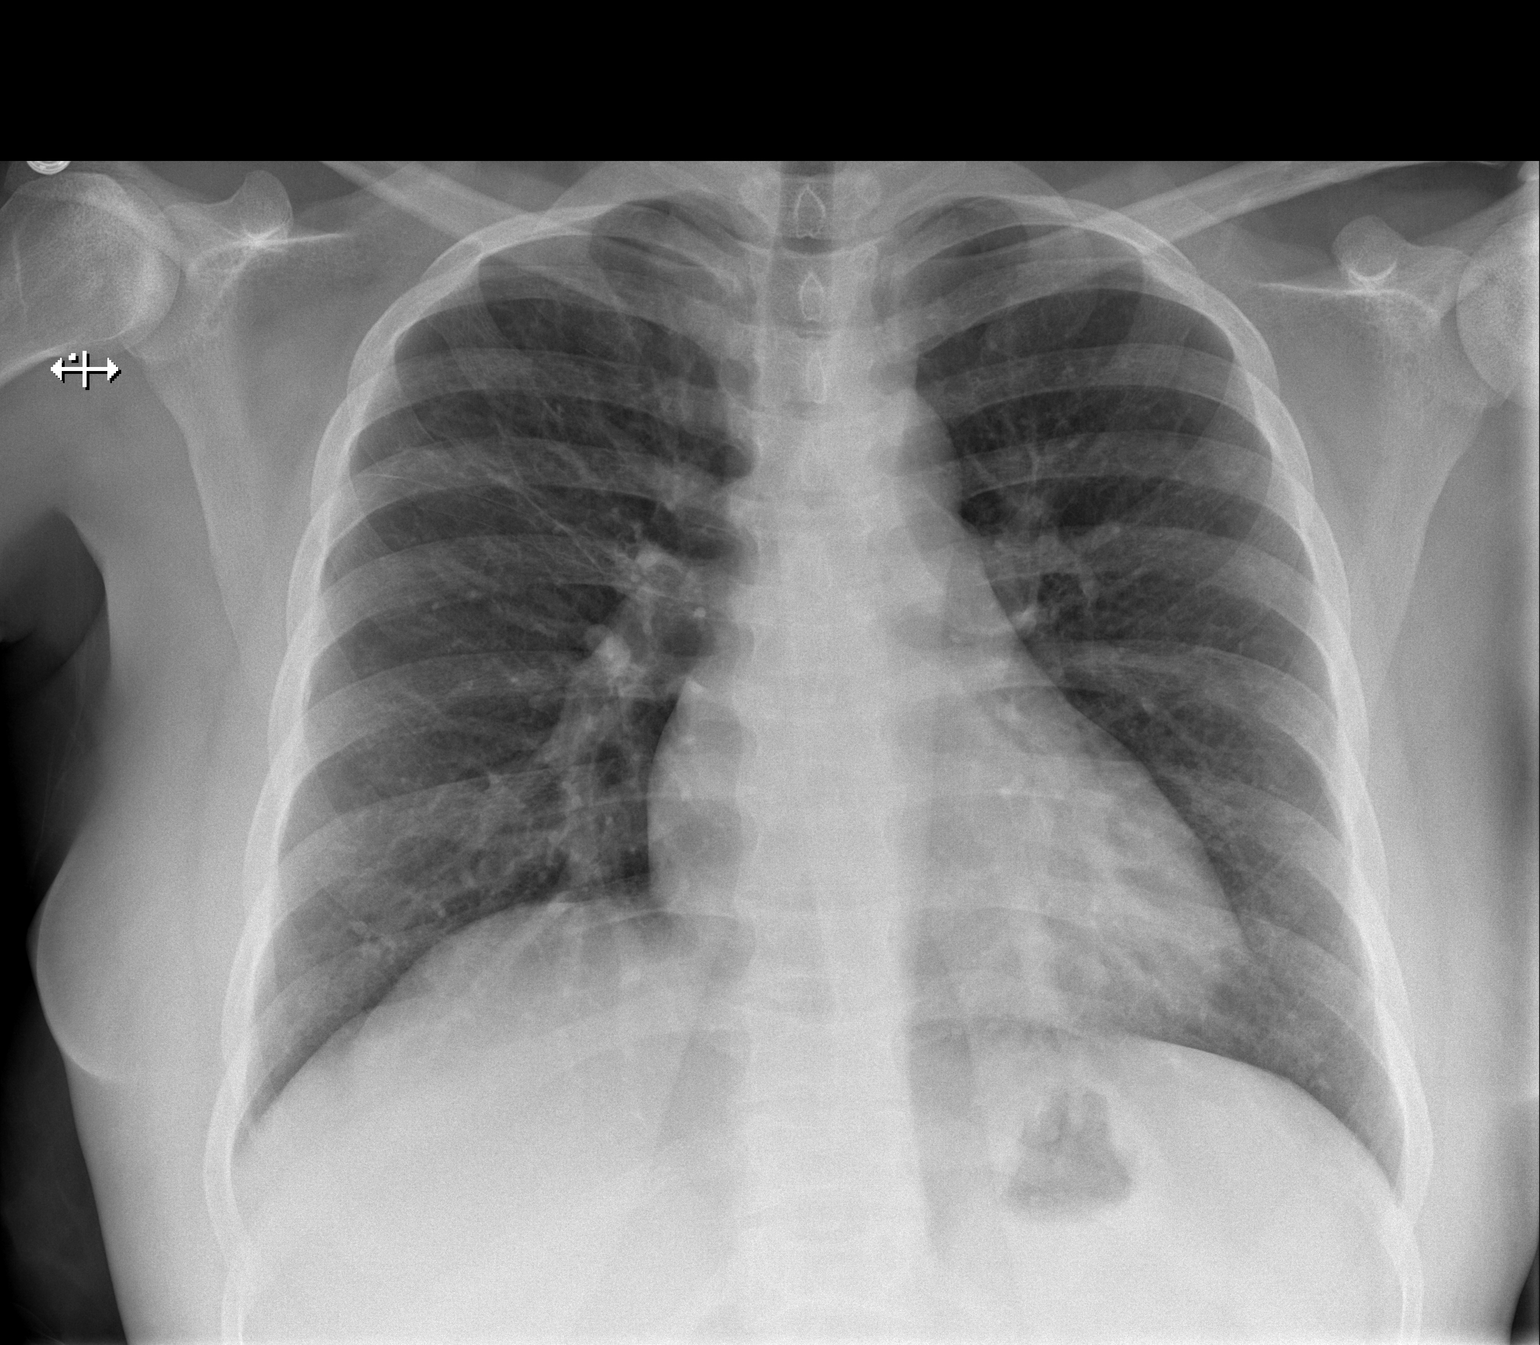

[w chest lat]
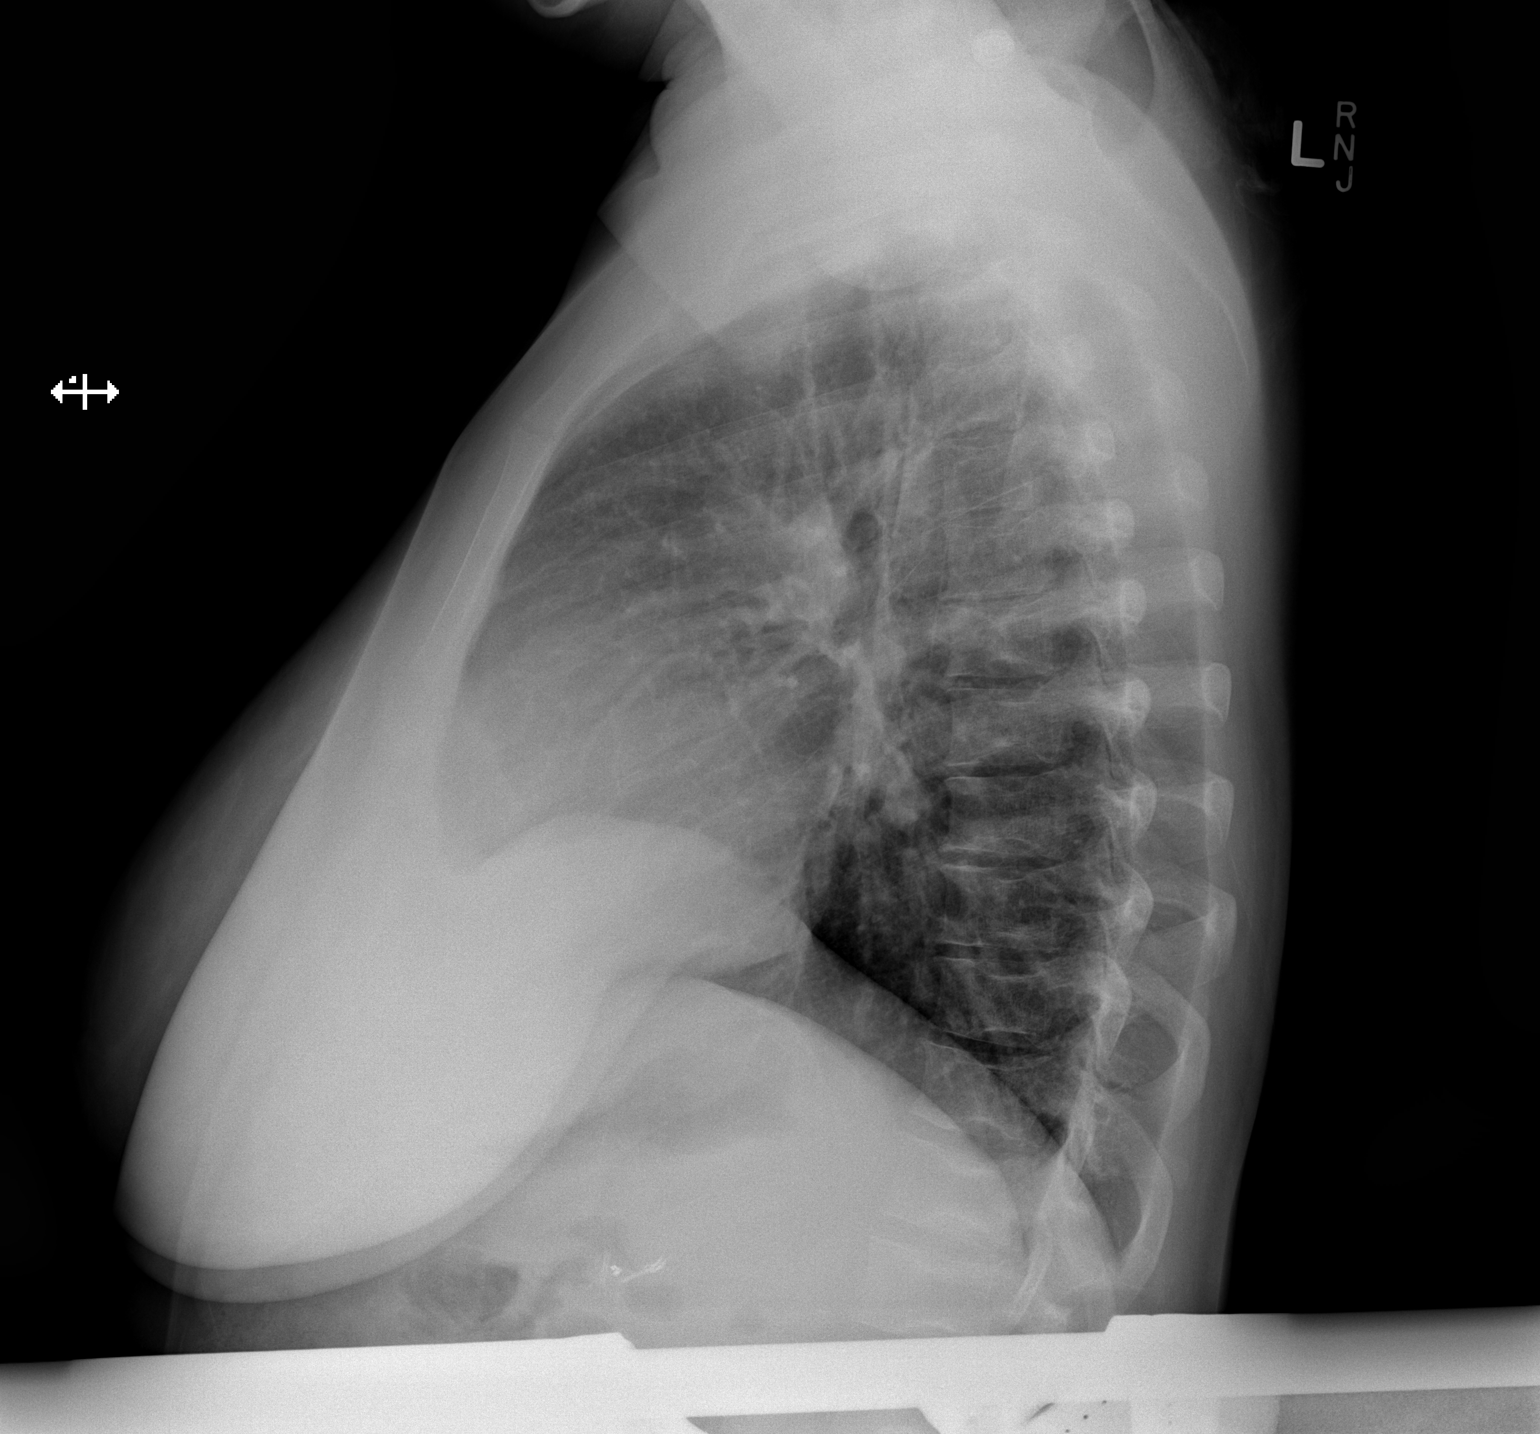

[2 of 2 positions shown; findings below may reference images not displayed]

FINDINGS: Normal heart size and pulmonary vascularity. No focal airspace
disease or consolidation in the lungs. No blunting of costophrenic
angles. No pneumothorax. Mediastinal contours appear intact. Bone
changes consistent with sickle cell.
IMPRESSION: No active cardiopulmonary disease.

## 2015-07-06 ENCOUNTER — Ambulatory Visit (INDEPENDENT_AMBULATORY_CARE_PROVIDER_SITE_OTHER): Payer: Medicare Other | Admitting: Family Medicine

## 2015-07-06 VITALS — BP 127/80 | HR 94 | Temp 98.1°F | Resp 16 | Ht 62.0 in | Wt 173.0 lb

## 2015-07-06 DIAGNOSIS — Z3480 Encounter for supervision of other normal pregnancy, unspecified trimester: Secondary | ICD-10-CM

## 2015-07-06 DIAGNOSIS — Z3492 Encounter for supervision of normal pregnancy, unspecified, second trimester: Secondary | ICD-10-CM

## 2015-07-06 DIAGNOSIS — D571 Sickle-cell disease without crisis: Secondary | ICD-10-CM | POA: Diagnosis not present

## 2015-07-06 DIAGNOSIS — Z331 Pregnant state, incidental: Secondary | ICD-10-CM | POA: Diagnosis not present

## 2015-07-06 LAB — CBC WITH DIFFERENTIAL/PLATELET
BASOS PCT: 0 % (ref 0–1)
Basophils Absolute: 0 10*3/uL (ref 0.0–0.1)
EOS ABS: 0.1 10*3/uL (ref 0.0–0.7)
EOS PCT: 1 % (ref 0–5)
HCT: 28 % — ABNORMAL LOW (ref 36.0–46.0)
HEMOGLOBIN: 9.4 g/dL — AB (ref 12.0–15.0)
Lymphocytes Relative: 15 % (ref 12–46)
Lymphs Abs: 2.1 10*3/uL (ref 0.7–4.0)
MCH: 31.5 pg (ref 26.0–34.0)
MCHC: 33.6 g/dL (ref 30.0–36.0)
MCV: 94 fL (ref 78.0–100.0)
MONO ABS: 1 10*3/uL (ref 0.1–1.0)
MONOS PCT: 7 % (ref 3–12)
MPV: 9.3 fL (ref 8.6–12.4)
Neutro Abs: 10.6 10*3/uL — ABNORMAL HIGH (ref 1.7–7.7)
Neutrophils Relative %: 77 % (ref 43–77)
PLATELETS: 404 10*3/uL — AB (ref 150–400)
RBC: 2.98 MIL/uL — AB (ref 3.87–5.11)
RDW: 16.6 % — AB (ref 11.5–15.5)
WBC: 13.8 10*3/uL — AB (ref 4.0–10.5)

## 2015-07-06 LAB — POCT URINALYSIS DIP (DEVICE)
Bilirubin Urine: NEGATIVE
GLUCOSE, UA: NEGATIVE mg/dL
HGB URINE DIPSTICK: NEGATIVE
KETONES UR: NEGATIVE mg/dL
Nitrite: NEGATIVE
PROTEIN: NEGATIVE mg/dL
SPECIFIC GRAVITY, URINE: 1.015 (ref 1.005–1.030)
UROBILINOGEN UA: 1 mg/dL (ref 0.0–1.0)
pH: 7 (ref 5.0–8.0)

## 2015-07-06 MED FILL — METHADONE HCL 5 MG TABLET: 5 | 30 days supply | Qty: 30 | Fill #0

## 2015-07-06 NOTE — Patient Instructions (Addendum)
Will schedule an ultrasoundSickle Cell Anemia, Adult Sickle cell anemia is a condition in which red blood cells have an abnormal "sickle" shape. This abnormal shape shortens the cells' life span, which results in a lower than normal concentration of red blood cells in the blood. The sickle shape also causes the cells to clump together and block free blood flow through the blood vessels. As a result, the tissues and organs of the body do not receive enough oxygen. Sickle cell anemia causes organ damage and pain and increases the risk of infection. CAUSES  Sickle cell anemia is a genetic disorder. Those who receive two copies of the gene have the condition, and those who receive one copy have the trait. RISK FACTORS The sickle cell gene is most common in people whose families originated in [place]. Other areas of the globe where sickle cell trait occurs include the [place], [place] and [place], and the [place].  SIGNS AND SYMPTOMS  Pain, especially in the extremities, back, chest, or abdomen (common). The pain may start suddenly or may develop following an illness, especially if there is dehydration. Pain can also occur due to overexertion or exposure to extreme temperature changes.  Frequent severe bacterial infections, especially certain types of pneumonia and meningitis.  Pain and swelling in the hands and feet.  Decreased activity.   Loss of appetite.   Change in behavior.  Headaches.  Seizures.  Shortness of breath or difficulty breathing.  Vision changes.  Skin ulcers. Those with the trait may not have symptoms or they may have mild symptoms.  DIAGNOSIS  Sickle cell anemia is diagnosed with blood tests that demonstrate the genetic trait. It is often diagnosed during the newborn period, due to mandatory testing nationwide. A variety of blood tests, X-rays, CT scans, MRI scans, ultrasounds, and lung function tests may also be done to monitor the  condition. TREATMENT  Sickle cell anemia may be treated with:  Medicines. You may be given pain medicines, antibiotic medicines (to treat and prevent infections) or medicines to increase the production of certain types of hemoglobin.  Fluids.  Oxygen.  Blood transfusions. HOME CARE INSTRUCTIONS   Drink enough fluid to keep your urine clear or pale yellow. Increase your fluid intake in hot weather and during exercise.  Do not smoke. Smoking lowers oxygen levels in the blood.   Only take over-the-counter or prescription medicines for pain, fever, or discomfort as directed by your health care provider.  Take antibiotics as directed by your health care provider. Make sure you finish them it even if you start to feel better.   Take supplements as directed by your health care provider.   Consider wearing a medical alert bracelet. This tells anyone caring for you in an emergency of your condition.   When traveling, keep your medical information, health care provider's names, and the medicines you take with you at all times.   If you develop a fever, do not take medicines to reduce the fever right away. This could cover up a problem that is developing. Notify your health care provider.  Keep all follow-up appointments with your health care provider. Sickle cell anemia requires regular medical care. SEEK MEDICAL CARE IF: You have a fever. SEEK IMMEDIATE MEDICAL CARE IF:   You feel dizzy or faint.   You have new abdominal pain, especially on the left side near the stomach area.   You develop a persistent, often uncomfortable and painful penile erection (priapism). If this is not  treated immediately it will lead to impotence.   You have numbness your arms or legs or you have a hard time moving them.   You have a hard time with speech.   You have a fever or persistent symptoms for more than 2-3 days.   You have a fever and your symptoms suddenly get worse.   You have  signs or symptoms of infection. These include:   Chills.   Abnormal tiredness (lethargy).   Irritability.   Poor eating.   Vomiting.   You develop pain that is not helped with medicine.   You develop shortness of breath.  You have pain in your chest.   You are coughing up pus-like or bloody sputum.   You develop a stiff neck.  Your feet or hands swell or have pain.  Your abdomen appears bloated.  You develop joint pain. MAKE SURE YOU:  Understand these instructions.   This information is not intended to replace advice given to you by your health care provider. Make sure you discuss any questions you have with your health care provider.   Document Released: 08/10/2005 Document Revised: 05/23/2014 Document Reviewed: 12/12/2012 Elsevier Interactive Patient Education [employer].

## 2015-07-06 NOTE — Progress Notes (Signed)
Subjective:    Patient ID: [name], female    DOB: 06/04/1990, 25 y.o.   MRN: [id number]  HPI [name], a 25 year old female with a history of sickle cell anemia, HbSS presents for a 1 month follow up. [name] is currently [redacted] weeks pregnant. She has not been evaluated by an obstetrician. She was referred to obstetrician previously and has an appointment on 07/20/2015. Patient states that she will be visiting [place] and will not be able to establish with obstetrician. She is asking for our office to reschedule her appointment and send her for an ultrasound. Patient is taking prenatal vitamins and folic acid consistently. She continues to take opiate medications for chronic pain consistent with sickle cell anemia. [name] states that she feels well and has minimal complaints. She maintains that current pain intensity is 4/10 to left neck. She last had Oxycodone 15 mg this am with moderate relief.  [name] denies headache, chest pains, fever, shortness of breath, abdominal pain, dysuria, nausea, vomiting, or diarrhea.  Past Medical History  Diagnosis Date  . Miscarriage 03/22/2011    Pt reports 2 miscarriages.  . Depression 01/06/2011  . GERD (gastroesophageal reflux disease) 02/17/2011  . Trichotillomania     h/o  . Blood transfusion     "lots"  . Sickle cell anemia with crisis ([place])   . Exertional dyspnea     "sometimes"  . Migraines 11/08/11    "@ least twice/month"  . Chronic back pain     "very severe; have knot in my back; from tight muscle; take RX and exercise for it"  . Mood swings ([place]) 11/08/11    "I go back and forth; real bad"  . Sickle cell anemia ([name])     Review of Systems  Constitutional: Negative.  Negative for fever and fatigue.  Eyes: Negative.  Negative for visual disturbance.  Respiratory: Negative.   Cardiovascular: Negative.   Gastrointestinal: Negative.   Endocrine: Negative.  Negative for polydipsia, polyphagia and polyuria.   Genitourinary: Negative.   Musculoskeletal: Positive for neck pain.  Skin: Negative.   Allergic/Immunologic: Negative.   Neurological: Negative.   Hematological: Negative.   Psychiatric/Behavioral: Negative.        Objective:   Physical Exam  Constitutional: She is oriented to person, place, and time. She appears well-developed and well-nourished.  HENT:  Head: Normocephalic and atraumatic.  Right Ear: External ear normal.  Left Ear: External ear normal.  Nose: Nose normal.  Mouth/Throat: Oropharynx is clear and moist.  Eyes: Conjunctivae and EOM are normal. Pupils are equal, round, and reactive to light.  Neck: Normal range of motion. Neck supple.  Cardiovascular: Normal rate, regular rhythm, normal heart sounds and intact distal pulses.   Pulmonary/Chest: Effort normal and breath sounds normal.  Abdominal: Soft. Bowel sounds are normal.  Musculoskeletal: Normal range of motion.       Right knee: She exhibits normal range of motion.       Left knee: She exhibits normal range of motion.  Neurological: She is alert and oriented to person, place, and time. She has normal reflexes.  Skin: Skin is warm and dry.  Psychiatric: She has a normal mood and affect. Her behavior is normal. Judgment and thought content normal.      BP 127/80 mmHg  Pulse 94  Temp(Src) 98.1 F (36.7 C) (Oral)  Resp 16  Ht 5\' 2"  (1.575 m)  Wt 173 lb (78.472 kg)  BMI 31.63 kg/m2  LMP 02/09/2015 Assessment & Plan:  1. Hb-SS disease without crisis ([place])  Continue folic acid 1 mg daily to prevent aplastic bone marrow crises.   Pulmonary evaluation - Patient denies severe recurrent wheezes, shortness of breath with exercise, or persistent cough. If these symptoms develop, pulmonary function tests with spirometry will be ordered, and if abnormal, plan on referral to Pulmonology for further evaluation.  Cardiac - Routine screening for pulmonary hypertension is not recommended.  Eye - High risk of  proliferative retinopathy. Annual eye exam with retinal exam recommended to patient. She states that she will schedule any eye examination.   Immunization status - Vaccinations up to date.    - POCT urinalysis dipstick - Hemoglobinopathy evaluation - CBC with Differential - COMPLETE METABOLIC PANEL WITH GFR - POCT urinalysis dip (device) - Ambulatory referral to Hematology   2. Second trimester pregnancy Patient has an appointment scheduled with obstetrician on 07/20/2015. [name] states that she will not be available on 3/6 to establish with ostetrician. Our office has attempted to call to schedule an earlier appointment, we were unable to speak with an associate to schedule. Will schedule an ultrasound. Also, patient may warrant further evaluation by a hematologist. Will check a hemoglobinopathy, to evaluate further. Prophylactic transfusion may be useful to maintain Hgb > 9 and <12 and percent hemoglobin S below 35-40%. Most start transfusions in 3rd trimester. Will discuss case further with Dr. Ned Clines.  - [place] OP OB Comp + 14 WK; Future - Ambulatory referral to Hematology  RTC: 1 month for sickle cell anemia and medication management Dorena Dew, FNP

## 2015-07-07 ENCOUNTER — Encounter: Payer: Self-pay | Admitting: Family Medicine

## 2015-07-07 DIAGNOSIS — Z3492 Encounter for supervision of normal pregnancy, unspecified, second trimester: Secondary | ICD-10-CM | POA: Insufficient documentation

## 2015-07-07 LAB — COMPLETE METABOLIC PANEL WITH GFR
ALBUMIN: 3.5 g/dL — AB (ref 3.6–5.1)
ALK PHOS: 57 U/L (ref 33–115)
ALT: 17 U/L (ref 6–29)
AST: 20 U/L (ref 10–30)
BUN: 5 mg/dL — ABNORMAL LOW (ref 7–25)
CHLORIDE: 104 mmol/L (ref 98–110)
CO2: 23 mmol/L (ref 20–31)
Calcium: 8.7 mg/dL (ref 8.6–10.2)
Creat: 0.43 mg/dL — ABNORMAL LOW (ref 0.50–1.10)
GFR, Est African American: >89 mL/min (ref >=60)
GLUCOSE: 86 mg/dL (ref 65–99)
POTASSIUM: 4.1 mmol/L (ref 3.5–5.3)
SODIUM: 137 mmol/L (ref 135–146)
Total Bilirubin: 2.2 mg/dL — ABNORMAL HIGH (ref 0.2–1.2)
Total Protein: 6.5 g/dL (ref 6.1–8.1)

## 2015-07-08 LAB — HEMOGLOBINOPATHY EVALUATION
HEMOGLOBIN OTHER: 0 %
HGB A2 QUANT: 2.8 % (ref 2.2–3.2)
HGB A: 15.1 % — AB (ref 96.8–97.8)
HGB F QUANT: 13.8 % — AB (ref 0.0–2.0)
HGB S QUANTITAION: 0 %

## 2015-07-09 ENCOUNTER — Other Ambulatory Visit: Payer: Self-pay | Admitting: Family Medicine

## 2015-07-09 DIAGNOSIS — Z3492 Encounter for supervision of normal pregnancy, unspecified, second trimester: Secondary | ICD-10-CM

## 2015-07-11 ENCOUNTER — Inpatient Hospital Stay (HOSPITAL_COMMUNITY)
Admission: EM | Admit: 2015-07-11 | Discharge: 2015-07-14 | DRG: 781 | Disposition: A | Discharge status: Home / Self Care | Payer: Medicare Other | Attending: Internal Medicine | Admitting: Internal Medicine

## 2015-07-11 ENCOUNTER — Encounter (HOSPITAL_COMMUNITY): Payer: Self-pay | Admitting: Emergency Medicine

## 2015-07-11 DIAGNOSIS — D638 Anemia in other chronic diseases classified elsewhere: Secondary | ICD-10-CM | POA: Diagnosis not present

## 2015-07-11 DIAGNOSIS — D72829 Elevated white blood cell count, unspecified: Secondary | ICD-10-CM | POA: Diagnosis not present

## 2015-07-11 DIAGNOSIS — O99012 Anemia complicating pregnancy, second trimester: Secondary | ICD-10-CM | POA: Diagnosis not present

## 2015-07-11 DIAGNOSIS — Z87891 Personal history of nicotine dependence: Secondary | ICD-10-CM

## 2015-07-11 DIAGNOSIS — R000 Tachycardia, unspecified: Secondary | ICD-10-CM | POA: Diagnosis present

## 2015-07-11 DIAGNOSIS — Z8249 Family history of ischemic heart disease and other diseases of the circulatory system: Secondary | ICD-10-CM

## 2015-07-11 DIAGNOSIS — K219 Gastro-esophageal reflux disease without esophagitis: Secondary | ICD-10-CM | POA: Diagnosis not present

## 2015-07-11 DIAGNOSIS — F329 Major depressive disorder, single episode, unspecified: Secondary | ICD-10-CM | POA: Diagnosis present

## 2015-07-11 DIAGNOSIS — Z79899 Other long term (current) drug therapy: Secondary | ICD-10-CM

## 2015-07-11 DIAGNOSIS — Z833 Family history of diabetes mellitus: Secondary | ICD-10-CM

## 2015-07-11 DIAGNOSIS — Z91018 Allergy to other foods: Secondary | ICD-10-CM

## 2015-07-11 DIAGNOSIS — Z3A23 23 weeks gestation of pregnancy: Secondary | ICD-10-CM | POA: Diagnosis not present

## 2015-07-11 DIAGNOSIS — O99612 Diseases of the digestive system complicating pregnancy, second trimester: Secondary | ICD-10-CM | POA: Diagnosis present

## 2015-07-11 DIAGNOSIS — D5700 Hb-SS disease with crisis, unspecified: Secondary | ICD-10-CM | POA: Diagnosis present

## 2015-07-11 DIAGNOSIS — Z3492 Encounter for supervision of normal pregnancy, unspecified, second trimester: Secondary | ICD-10-CM

## 2015-07-11 DIAGNOSIS — B009 Herpesviral infection, unspecified: Secondary | ICD-10-CM | POA: Diagnosis present

## 2015-07-11 DIAGNOSIS — G894 Chronic pain syndrome: Secondary | ICD-10-CM | POA: Diagnosis present

## 2015-07-11 DIAGNOSIS — N898 Other specified noninflammatory disorders of vagina: Secondary | ICD-10-CM | POA: Diagnosis present

## 2015-07-11 DIAGNOSIS — Z91040 Latex allergy status: Secondary | ICD-10-CM

## 2015-07-11 DIAGNOSIS — M62838 Other muscle spasm: Secondary | ICD-10-CM

## 2015-07-11 DIAGNOSIS — M549 Dorsalgia, unspecified: Secondary | ICD-10-CM | POA: Diagnosis present

## 2015-07-11 DIAGNOSIS — M542 Cervicalgia: Secondary | ICD-10-CM | POA: Diagnosis not present

## 2015-07-11 LAB — CBC WITH DIFFERENTIAL/PLATELET
BASOS ABS: 0 10*3/uL (ref 0.0–0.1)
Basophils Relative: 0 %
Eosinophils Absolute: 0.3 10*3/uL (ref 0.0–0.7)
Eosinophils Relative: 2 %
HEMATOCRIT: 25.8 % — AB (ref 36.0–46.0)
HEMOGLOBIN: 9.1 g/dL — AB (ref 12.0–15.0)
LYMPHS PCT: 16 %
Lymphs Abs: 3 10*3/uL (ref 0.7–4.0)
MCH: 32.7 pg (ref 26.0–34.0)
MCHC: 35.3 g/dL (ref 30.0–36.0)
MCV: 92.8 fL (ref 78.0–100.0)
MONO ABS: 1.7 10*3/uL — AB (ref 0.1–1.0)
MONOS PCT: 9 %
NEUTROS ABS: 13.4 10*3/uL — AB (ref 1.7–7.7)
NEUTROS PCT: 73 %
Platelets: 490 10*3/uL — ABNORMAL HIGH (ref 150–400)
RBC: 2.78 MIL/uL — ABNORMAL LOW (ref 3.87–5.11)
RDW: 16.1 % — AB (ref 11.5–15.5)
WBC: 18.5 10*3/uL — ABNORMAL HIGH (ref 4.0–10.5)

## 2015-07-11 MED ORDER — ONDANSETRON HCL 4 MG/2ML IJ SOLN
4.0000 mg | Freq: Once | INTRAMUSCULAR | Status: AC
  Administered 2015-07-11: 4 mg via INTRAVENOUS
  Filled 2015-07-11: qty 2

## 2015-07-11 MED ORDER — DIPHENHYDRAMINE HCL 50 MG/ML IJ SOLN
25.0000 mg | Freq: Once | INTRAMUSCULAR | Status: AC
  Administered 2015-07-11: 25 mg via INTRAVENOUS
  Filled 2015-07-11: qty 1

## 2015-07-11 MED ORDER — SODIUM CHLORIDE 0.9 % IV BOLUS (SEPSIS)
1000.0000 mL | Freq: Once | INTRAVENOUS | Status: AC
  Administered 2015-07-11: 1000 mL via INTRAVENOUS

## 2015-07-11 MED ORDER — HYDROMORPHONE HCL 2 MG/ML IJ SOLN
2.0000 mg | INTRAMUSCULAR | Status: AC | PRN
  Administered 2015-07-11 – 2015-07-12 (×3): 2 mg via INTRAVENOUS
  Filled 2015-07-11 (×3): qty 1

## 2015-07-11 NOTE — ED Notes (Signed)
Pt states that she has had a knot in her neck for about 2 weeks. She states today around 1500, she began suffering from sickle cell related pain in her shoulders. Pt states this is normal for her sickle cell pain. Pt is also [redacted] weeks pregnant. Pt last seen at the sickle cell clinic last Friday.

## 2015-07-12 ENCOUNTER — Encounter (HOSPITAL_COMMUNITY): Payer: Self-pay

## 2015-07-12 DIAGNOSIS — F329 Major depressive disorder, single episode, unspecified: Secondary | ICD-10-CM | POA: Diagnosis present

## 2015-07-12 DIAGNOSIS — R000 Tachycardia, unspecified: Secondary | ICD-10-CM | POA: Diagnosis present

## 2015-07-12 DIAGNOSIS — N898 Other specified noninflammatory disorders of vagina: Secondary | ICD-10-CM | POA: Diagnosis not present

## 2015-07-12 DIAGNOSIS — Z91040 Latex allergy status: Secondary | ICD-10-CM | POA: Diagnosis not present

## 2015-07-12 DIAGNOSIS — Z79899 Other long term (current) drug therapy: Secondary | ICD-10-CM | POA: Diagnosis not present

## 2015-07-12 DIAGNOSIS — K219 Gastro-esophageal reflux disease without esophagitis: Secondary | ICD-10-CM | POA: Diagnosis present

## 2015-07-12 DIAGNOSIS — D72829 Elevated white blood cell count, unspecified: Secondary | ICD-10-CM | POA: Diagnosis not present

## 2015-07-12 DIAGNOSIS — Z87891 Personal history of nicotine dependence: Secondary | ICD-10-CM | POA: Diagnosis not present

## 2015-07-12 DIAGNOSIS — G894 Chronic pain syndrome: Secondary | ICD-10-CM | POA: Diagnosis present

## 2015-07-12 DIAGNOSIS — Z91018 Allergy to other foods: Secondary | ICD-10-CM | POA: Diagnosis not present

## 2015-07-12 DIAGNOSIS — O99012 Anemia complicating pregnancy, second trimester: Secondary | ICD-10-CM | POA: Diagnosis present

## 2015-07-12 DIAGNOSIS — Z833 Family history of diabetes mellitus: Secondary | ICD-10-CM | POA: Diagnosis not present

## 2015-07-12 DIAGNOSIS — Z331 Pregnant state, incidental: Secondary | ICD-10-CM | POA: Diagnosis not present

## 2015-07-12 DIAGNOSIS — M549 Dorsalgia, unspecified: Secondary | ICD-10-CM | POA: Diagnosis present

## 2015-07-12 DIAGNOSIS — O99612 Diseases of the digestive system complicating pregnancy, second trimester: Secondary | ICD-10-CM | POA: Diagnosis present

## 2015-07-12 DIAGNOSIS — M542 Cervicalgia: Secondary | ICD-10-CM | POA: Diagnosis not present

## 2015-07-12 DIAGNOSIS — D5700 Hb-SS disease with crisis, unspecified: Secondary | ICD-10-CM | POA: Diagnosis not present

## 2015-07-12 DIAGNOSIS — D638 Anemia in other chronic diseases classified elsewhere: Secondary | ICD-10-CM | POA: Diagnosis present

## 2015-07-12 DIAGNOSIS — Z8249 Family history of ischemic heart disease and other diseases of the circulatory system: Secondary | ICD-10-CM | POA: Diagnosis not present

## 2015-07-12 DIAGNOSIS — Z3A23 23 weeks gestation of pregnancy: Secondary | ICD-10-CM | POA: Diagnosis not present

## 2015-07-12 LAB — COMPREHENSIVE METABOLIC PANEL
ALBUMIN: 3.6 g/dL (ref 3.5–5.0)
ALT: 19 U/L (ref 14–54)
ALT: 20 U/L (ref 14–54)
ANION GAP: 10 (ref 5–15)
AST: 21 U/L (ref 15–41)
AST: 24 U/L (ref 15–41)
Albumin: 3 g/dL — ABNORMAL LOW (ref 3.5–5.0)
Alkaline Phosphatase: 55 U/L (ref 38–126)
Alkaline Phosphatase: 50 U/L (ref 38–126)
Anion gap: 8 (ref 5–15)
BILIRUBIN TOTAL: 1.8 mg/dL — AB (ref 0.3–1.2)
BUN: 10 mg/dL (ref 6–20)
BUN: 8 mg/dL (ref 6–20)
CHLORIDE: 104 mmol/L (ref 101–111)
CHLORIDE: 110 mmol/L (ref 101–111)
CO2: 20 mmol/L — AB (ref 22–32)
CO2: 21 mmol/L — ABNORMAL LOW (ref 22–32)
CREATININE: 0.51 mg/dL (ref 0.44–1.00)
Calcium: 8.7 mg/dL — ABNORMAL LOW (ref 8.9–10.3)
Calcium: 8.2 mg/dL — ABNORMAL LOW (ref 8.9–10.3)
Creatinine, Ser: 0.72 mg/dL (ref 0.44–1.00)
GFR calc Af Amer: >60 mL/min (ref >60)
GFR calc non Af Amer: >60 mL/min (ref >60)
GLUCOSE: 89 mg/dL (ref 65–99)
GLUCOSE: 88 mg/dL (ref 65–99)
POTASSIUM: 3.8 mmol/L (ref 3.5–5.1)
Potassium: 3.9 mmol/L (ref 3.5–5.1)
Sodium: 134 mmol/L — ABNORMAL LOW (ref 135–145)
Sodium: 139 mmol/L (ref 135–145)
TOTAL PROTEIN: 6.1 g/dL — AB (ref 6.5–8.1)
Total Bilirubin: 2.1 mg/dL — ABNORMAL HIGH (ref 0.3–1.2)
Total Protein: 7 g/dL (ref 6.5–8.1)

## 2015-07-12 LAB — APTT: aPTT: 29 seconds (ref 24–37)

## 2015-07-12 LAB — RETICULOCYTES
RBC.: 2.84 MIL/uL — AB (ref 3.87–5.11)
RETIC COUNT ABSOLUTE: 528.2 10*3/uL — AB (ref 19.0–186.0)
RETIC CT PCT: 18.6 % — AB (ref 0.4–3.1)

## 2015-07-12 LAB — MAGNESIUM: Magnesium: 1.8 mg/dL (ref 1.7–2.4)

## 2015-07-12 LAB — PROTIME-INR
INR: 1.19 (ref 0.00–1.49)
PROTHROMBIN TIME: 14.9 s (ref 11.6–15.2)

## 2015-07-12 LAB — LACTATE DEHYDROGENASE: LDH: 210 U/L — ABNORMAL HIGH (ref 98–192)

## 2015-07-12 MED ORDER — METHADONE HCL 5 MG PO TABS
5.0000 mg | ORAL_TABLET | Freq: Every day | ORAL | Status: DC
  Administered 2015-07-12 – 2015-07-13 (×2): 5 mg via ORAL
  Filled 2015-07-12 (×2): qty 1

## 2015-07-12 MED ORDER — SODIUM CHLORIDE 0.9 % IV SOLN: INTRAVENOUS | Status: AC

## 2015-07-12 MED ORDER — FOLIC ACID 1 MG PO TABS
1.0000 mg | ORAL_TABLET | Freq: Every day | ORAL | Status: DC
  Administered 2015-07-12 – 2015-07-14 (×3): 1 mg via ORAL
  Filled 2015-07-12 (×3): qty 1

## 2015-07-12 MED ORDER — SENNOSIDES-DOCUSATE SODIUM 8.6-50 MG PO TABS
1.0000 | ORAL_TABLET | Freq: Two times a day (BID) | ORAL | Status: DC
  Administered 2015-07-12 – 2015-07-13 (×4): 1 via ORAL
  Filled 2015-07-12 (×5): qty 1

## 2015-07-12 MED ORDER — ENOXAPARIN SODIUM 40 MG/0.4ML ~~LOC~~ SOLN: 40.0000 mg | SUBCUTANEOUS | Status: DC

## 2015-07-12 MED ORDER — ONDANSETRON HCL 4 MG/2ML IJ SOLN
4.0000 mg | Freq: Four times a day (QID) | INTRAMUSCULAR | Status: DC | PRN
  Administered 2015-07-13: 4 mg via INTRAVENOUS
  Filled 2015-07-12: qty 2

## 2015-07-12 MED ORDER — HYDROMORPHONE 1 MG/ML IV SOLN
INTRAVENOUS | Status: DC
  Administered 2015-07-12: 13.02 mg via INTRAVENOUS
  Administered 2015-07-12: 04:00:00 via INTRAVENOUS
  Administered 2015-07-12: 9.8 mg via INTRAVENOUS
  Administered 2015-07-12: 14:00:00 via INTRAVENOUS
  Administered 2015-07-13: 3.8 mg via INTRAVENOUS
  Administered 2015-07-13: 09:00:00 via INTRAVENOUS
  Filled 2015-07-12 (×3): qty 25

## 2015-07-12 MED ORDER — CYCLOBENZAPRINE HCL 10 MG PO TABS: 10.0000 mg | ORAL_TABLET | Freq: Two times a day (BID) | ORAL | Status: DC | PRN

## 2015-07-12 MED ORDER — SODIUM CHLORIDE 0.9% FLUSH: 9.0000 mL | INTRAVENOUS | Status: DC | PRN

## 2015-07-12 MED ORDER — DEXTROSE-NACL 5-0.45 % IV SOLN: INTRAVENOUS | Status: DC

## 2015-07-12 MED ORDER — NALOXONE HCL 0.4 MG/ML IJ SOLN: 0.4000 mg | INTRAMUSCULAR | Status: DC | PRN

## 2015-07-12 MED ORDER — SENNOSIDES-DOCUSATE SODIUM 8.6-50 MG PO TABS: 1.0000 | ORAL_TABLET | Freq: Every evening | ORAL | Status: DC | PRN

## 2015-07-12 MED ORDER — DIPHENHYDRAMINE HCL 50 MG/ML IJ SOLN
INTRAMUSCULAR | Status: AC
  Filled 2015-07-12: qty 1

## 2015-07-12 MED ORDER — HYDROMORPHONE HCL 2 MG/ML IJ SOLN
2.0000 mg | INTRAMUSCULAR | Status: AC | PRN
  Administered 2015-07-12 (×3): 2 mg via INTRAVENOUS
  Filled 2015-07-12 (×2): qty 1

## 2015-07-12 MED ORDER — DEXTROSE-NACL 5-0.45 % IV SOLN
INTRAVENOUS | Status: DC
  Administered 2015-07-12 – 2015-07-13 (×3): via INTRAVENOUS

## 2015-07-12 MED ORDER — DIPHENHYDRAMINE HCL 50 MG/ML IJ SOLN
12.5000 mg | Freq: Once | INTRAMUSCULAR | Status: AC
  Administered 2015-07-12: 12.5 mg via INTRAVENOUS

## 2015-07-12 MED ORDER — PRENATAL MULTIVITAMIN CH
1.0000 | ORAL_TABLET | Freq: Every day | ORAL | Status: DC
  Administered 2015-07-12 – 2015-07-14 (×3): 1 via ORAL
  Filled 2015-07-12 (×3): qty 1

## 2015-07-12 MED ORDER — CVS PRENATAL GUMMY 0.4-113.5 MG PO CHEW: 2.0000 | CHEWABLE_TABLET | Freq: Every day | ORAL | Status: DC

## 2015-07-12 MED ORDER — DIPHENHYDRAMINE HCL 12.5 MG/5ML PO ELIX: 12.5000 mg | ORAL_SOLUTION | Freq: Four times a day (QID) | ORAL | Status: DC | PRN

## 2015-07-12 MED ORDER — DIPHENHYDRAMINE HCL 50 MG/ML IJ SOLN
12.5000 mg | Freq: Four times a day (QID) | INTRAMUSCULAR | Status: DC | PRN
  Administered 2015-07-12 – 2015-07-14 (×5): 12.5 mg via INTRAVENOUS
  Filled 2015-07-12 (×5): qty 1

## 2015-07-12 NOTE — Progress Notes (Signed)
Spoke with Dr. Harolyn Rutherford. Fetal monitoring orders changed to Doppler every day.

## 2015-07-12 NOTE — Progress Notes (Signed)
Patient admitted this morning with sickle cell painful crisis. She has second trimester pregnancy with frequent sickle cell crisis attacks. Currently on Dilaudid PCA and physician assisted dosing. She cannot get nonsteroidal anti-inflammatory agents due to pregnancy. I will restart her home methadone and continue hydration on follow-up.

## 2015-07-12 NOTE — ED Provider Notes (Signed)
CSN: [id number]     Arrival date & time 07/11/15  2201 History   First MD Initiated Contact with Patient 07/11/15 2309     Chief Complaint  Patient presents with  . Sickle Cell Pain Crisis     (Consider location/radiation/quality/duration/timing/severity/associated sxs/prior Treatment) HPI [name] is a 25 y.o. female with history of sickle cell anemia, presents to emergency department complaining of neck pain and bilateral shoulder pain. Patient states she developed a "knot" in severe pain in the left side of her neck several days ago. She believes that it triggered pain in her bilateral shoulders which she thinks is related to sickle cell crisis. She states "adenoma shoulders is from sickle cell, but I don't know what's wrong with my neck." She denies any injuries. She denies any weakness or numbness in her arms or legs. She denies any difficulty with use of her arms or legs. She has been taking her current pain medications which has not been helping. She states pain in her shoulder started today. Patient is [redacted] weeks pregnant. Denies abdominal pain, vaginal discharge, bleeding, any pregnancy related complaints.  Past Medical History  Diagnosis Date  . Miscarriage 03/22/2011    Pt reports 2 miscarriages.  . Depression 01/06/2011  . GERD (gastroesophageal reflux disease) 02/17/2011  . Trichotillomania     h/o  . Blood transfusion     "lots"  . Sickle cell anemia with crisis ([place])   . Exertional dyspnea     "sometimes"  . Migraines 11/08/11    "@ least twice/month"  . Chronic back pain     "very severe; have knot in my back; from tight muscle; take RX and exercise for it"  . Mood swings ([place]) 11/08/11    "I go back and forth; real bad"  . Sickle cell anemia [facility])    Past Surgical History  Procedure Laterality Date  . Cholecystectomy  05/2010  . Dilation and curettage of uterus  02/20/11    S/P miscarriage   Family History  Problem Relation Age of Onset  . Diabetes Maternal  Grandmother   . Diabetes Paternal Grandmother   . Hypertension Paternal Grandmother   . Diabetes Maternal Grandfather   . Sickle cell trait Mother   . Sickle cell trait Father    Social History  Substance Use Topics  . Smoking status: Former Smoker -- 0.25 packs/day for 1 years    Types: Cigarettes    Quit date: 03/25/2013  . Smokeless tobacco: Never Used  . Alcohol Use: No     Comment: pt states she quit marijuan in May 2013. Rare ETOH, + cigarettes.  She is enrolled in school   OB History    Gravida Para Term Preterm AB TAB SAB Ectopic Multiple Living   3 1 1  1  1   0 1      Obstetric Comments   Miscarried in October 2012 at about 7 weeks     Review of Systems  Constitutional: Negative for fever and chills.  Respiratory: Negative for cough, chest tightness and shortness of breath.   Cardiovascular: Negative for chest pain, palpitations and leg swelling.  Gastrointestinal: Negative for nausea, vomiting, abdominal pain and diarrhea.  Genitourinary: Negative for dysuria, flank pain, vaginal bleeding, vaginal discharge, vaginal pain and pelvic pain.  Musculoskeletal: Positive for myalgias, arthralgias and neck pain. Negative for neck stiffness.  Skin: Negative for rash.  Neurological: Negative for dizziness, weakness and headaches.  All other systems reviewed and are negative.  Allergies  Carrot; Carrot oil; and Latex  Home Medications   Prior to Admission medications   Medication Sig Start Date End Date Taking? Authorizing Provider  methadone (DOLOPHINE) 5 MG tablet Take 1 tablet (5 mg total) by mouth at bedtime. 07/03/15  Yes Olugbemiga Essie Christine, MD  oxyCODONE (ROXICODONE) 15 MG immediate release tablet Take 1 tablet (15 mg total) by mouth every 4 (four) hours as needed for pain. 07/03/15  Yes Tresa Garter, MD  Prenatal Vit-Min-FA-Fish Oil ([employer]S PRENATAL GUMMY) 0.4-113.5 MG CHEW Chew 2-3 each by mouth daily.   Yes Historical Provider, MD  folic acid (FOLVITE) 1  MG tablet Take 2 tablets (2 mg total) by mouth daily. Patient not taking: Reported on 06/28/2015 03/26/15   [name], MD   BP 110/74 mmHg  Pulse 98  Temp(Src) 98 F (36.7 C) (Oral)  Resp 18  Ht 5\' 1"  (1.549 m)  Wt 79.379 kg  BMI 33.08 kg/m2  SpO2 96%  LMP 02/09/2015 Physical Exam  Constitutional: She is oriented to person, place, and time. She appears well-developed and well-nourished. No distress.  HENT:  Head: Normocephalic.  Eyes: Conjunctivae are normal.  Neck: Neck supple.  Tenderness to palpation of the left cervical paraspinal muscles. Full range of motion of the neck. No midline tenderness. No palpable "knots" noted. The erythema, and no rashes, no swelling.  Cardiovascular: Normal rate, regular rhythm and normal heart sounds.   Pulmonary/Chest: Effort normal and breath sounds normal. No respiratory distress. She has no wheezes. She has no rales.  Abdominal: Soft. Bowel sounds are normal. She exhibits no distension. There is no tenderness. There is no rebound.  gravid  Musculoskeletal: She exhibits no edema.  Fulguration of bilateral upper lower extremities. Pain with range of motion of bilateral shoulders.  Neurological: She is alert and oriented to person, place, and time.  Skin: Skin is warm and dry.  Psychiatric: She has a normal mood and affect. Her behavior is normal.  Nursing note and vitals reviewed.   ED Course  Procedures (including critical care time) Labs Review Labs Reviewed  COMPREHENSIVE METABOLIC PANEL - Abnormal; Notable for the following:    Sodium 134 (*)    CO2 20 (*)    Calcium 8.7 (*)    Total Bilirubin 2.1 (*)    All other components within normal limits  CBC WITH DIFFERENTIAL/PLATELET - Abnormal; Notable for the following:    WBC 18.5 (*)    RBC 2.78 (*)    Hemoglobin 9.1 (*)    HCT 25.8 (*)    RDW 16.1 (*)    Platelets 490 (*)    Neutro Abs 13.4 (*)    Monocytes Absolute 1.7 (*)    All other components within normal limits   RETICULOCYTES - Abnormal; Notable for the following:    Retic Ct Pct 18.6 (*)    RBC. 2.84 (*)    Retic Count, Manual 528.2 (*)    All other components within normal limits    Imaging Review No results found. I have personally reviewed and evaluated these images and lab results as part of my medical decision-making.   EKG Interpretation None      MDM   Final diagnoses:  Muscle spasms of head or neck  Sickle cell crisis ([place])    patient is here with pain to the posterior left neck but she describes comes and goes. Most likely spasms based on exam. Patient has not had any injuries. No benign tenderness. No neuro  deficits on exam. Patient also believes that she is having a sickle cell crisis with pain in bilateral shoulders. Will start pain medications, check labs. Pt is pregnant, but has no pregnancy related complaints. FHT 140s. No chest pain or shortness of breath. No cough. Do not think patient has acute chest syndrome.  1:01 AM Labs a baseline. Hemoglobin 9.1. Patient received 2 doses of IV Dilaudid, 2 mg, states she's feeling better. States pain is still there but improved. Patient does not be admitted to the hospital. She wants to be discharged home. She will follow-up with her doctor.  Filed Vitals:   07/11/15 2232 07/12/15 0041  BP: 112/75 110/74  Pulse: 103 98  Temp: 98 F (36.7 C)   TempSrc: Oral   Resp: 20 18  Height: 5\' 1"  (1.549 m)   Weight: 79.379 kg   SpO2: 96% 96%     Deone Leifheit, PA-C 07/12/15 0121  1:29 AM After the patient was already discharged, she called me back in her room crying stating that she is actually feeling worse now. Patient already received 6 mg of Dilaudid. Patient wants to be admitted to the hospital for pain control. Will call [facility]ad  1:49 AM Spoke with Dr. Blaine Hamper, will admit.     Jeannett Senior, PA-C 07/12/15 0150  Merryl Hacker, MD 07/12/15 [phone]

## 2015-07-12 NOTE — Discharge Instructions (Signed)
Continue your pain medications. Take flexeril for muscle spasms. Continue to use heat and try massage and stretching. Follow up with your doctor.   Muscle Cramps and Spasms Muscle cramps and spasms occur when a muscle or muscles tighten and you have no control over this tightening (involuntary muscle contraction). They are a common problem and can develop in any muscle. The most common place is in the calf muscles of the leg. Both muscle cramps and muscle spasms are involuntary muscle contractions, but they also have differences:   Muscle cramps are sporadic and painful. They may last a few seconds to a quarter of an hour. Muscle cramps are often more forceful and last longer than muscle spasms.  Muscle spasms may or may not be painful. They may also last just a few seconds or much longer. CAUSES  It is uncommon for cramps or spasms to be due to a serious underlying problem. In many cases, the cause of cramps or spasms is unknown. Some common causes are:   Overexertion.   Overuse from repetitive motions (doing the same thing over and over).   Remaining in a certain position for a long period of time.   Improper preparation, form, or technique while performing a sport or activity.   Dehydration.   Injury.   Side effects of some medicines.   Abnormally low levels of the salts and ions in your blood (electrolytes), especially potassium and calcium. This could happen if you are taking water pills (diuretics) or you are pregnant.  Some underlying medical problems can make it more likely to develop cramps or spasms. These include, but are not limited to:   Diabetes.   Parkinson disease.   Hormone disorders, such as thyroid problems.   Alcohol abuse.   Diseases specific to muscles, joints, and bones.   Blood vessel disease where not enough blood is getting to the muscles.  HOME CARE INSTRUCTIONS   Stay well hydrated. Drink enough water and fluids to keep your urine  clear or pale yellow.  It may be helpful to massage, stretch, and relax the affected muscle.  For tight or tense muscles, use a warm towel, heating pad, or hot shower water directed to the affected area.  If you are sore or have pain after a cramp or spasm, applying ice to the affected area may relieve discomfort.  Put ice in a plastic bag.  Place a towel between your skin and the bag.  Leave the ice on for 15-20 minutes, 03-04 times a day.  Medicines used to treat a known cause of cramps or spasms may help reduce their frequency or severity. Only take over-the-counter or prescription medicines as directed by your caregiver. SEEK MEDICAL CARE IF:  Your cramps or spasms get more severe, more frequent, or do not improve over time.  MAKE SURE YOU:   Understand these instructions.  Will watch your condition.  Will get help right away if you are not doing well or get worse.   This information is not intended to replace advice given to you by your health care provider. Make sure you discuss any questions you have with your health care provider.   Document Released: 10/22/2001 Document Revised: 08/27/2012 Document Reviewed: 04/18/2012 Elsevier Interactive Patient Education [employer].

## 2015-07-12 NOTE — Progress Notes (Signed)
Utilization review completed.  

## 2015-07-12 NOTE — H&P (Signed)
[facility] History and Physical  [name] [id number] DOB: [birth year] DOA: 07/11/2015  Referring physician: ED physician PCP: Angelica Chessman, MD  Specialists:   Chief Complaint: Bilateral shoulder pain, left neck pain  HPI: [name] is a 25 y.o. female with PMH of sickle cell disease, chronic back pain, migraine headache, miscarriage, GERD, depression, 23 week pregnancy, who presents with bilateral shoulder pain and left neck pain.  Patient reports that she has been having left neck pain in the past 2 weeks, which is not relieved with her home oral pain medications. She started having left shoulder pain in the past 2, which has been progressively getting worse. Patient does not have weakness or numbness in her arms. No injury to her shoulder or neck. She does not have chest pain, cough, shortness breath, abdominal pain or diarrhea. She denies any symptoms of her UTI. She has chronic back pain which has not changed. Regarding her pregnancy, patient does not have any complications and no vaginal bleeding. Fetus moves normally.  In ED, patient was found to have WBC 18.5, stable hemoglobin 9.4 on 07/06/15-->9.1, urinalysis with small month leukocytes, transient tachycardia, electrolytes and renal function okay. Patient is admitted to inpatient for further interventional treatment.  EKG: Independently reviewed.  Not done in ED, will get one.   Where does patient live?   At home  Can patient participate in ADLs?  Yes    Review of Systems:   General: no fevers, chills, no changes in body weight, has fatigue HEENT: no blurry vision, hearing changes or sore throat Pulm: no dyspnea, coughing, wheezing CV: no chest pain, no palpitations Abd: no nausea, vomiting, abdominal pain, diarrhea, constipation GU: no dysuria, burning on urination, increased urinary frequency, hematuria  Ext: no leg edema Neuro: no unilateral weakness, numbness, or tingling, no vision change or  hearing loss Skin: no rash MSK: Has chronic back pain, has left neck pain and bilateral shoulder pain. Heme: No easy bruising.  Travel history: No recent long distant travel.  Allergy:  Allergies  Allergen Reactions  . Carrot [Daucus [place]] Hives, Swelling and Rash    Dietary:  Please do not send any form of carrot to patient  . Carrot Oil Hives and Swelling  . Latex Rash    *powdered Latex*     Past Medical History  Diagnosis Date  . Miscarriage 03/22/2011    Pt reports 2 miscarriages.  . Depression 01/06/2011  . GERD (gastroesophageal reflux disease) 02/17/2011  . Trichotillomania     h/o  . Blood transfusion     "lots"  . Sickle cell anemia with crisis ([place])   . Exertional dyspnea     "sometimes"  . Migraines 11/08/11    "@ least twice/month"  . Chronic back pain     "very severe; have knot in my back; from tight muscle; take RX and exercise for it"  . Mood swings ([place]) 11/08/11    "I go back and forth; real bad"  . Sickle cell anemia [facility])     Past Surgical History  Procedure Laterality Date  . Cholecystectomy  05/2010  . Dilation and curettage of uterus  02/20/11    S/P miscarriage    Social History:  reports that she quit smoking about 2 years ago. Her smoking use included Cigarettes. She has a .25 pack-year smoking history. She has never used smokeless tobacco. She reports that she does not drink alcohol or use illicit drugs.  Family History:  Family History  Problem  Relation Age of Onset  . Diabetes Maternal Grandmother   . Diabetes Paternal Grandmother   . Hypertension Paternal Grandmother   . Diabetes Maternal Grandfather   . Sickle cell trait Mother   . Sickle cell trait Father      Prior to Admission medications   Medication Sig Start Date End Date Taking? Authorizing Provider  methadone (DOLOPHINE) 5 MG tablet Take 1 tablet (5 mg total) by mouth at bedtime. 07/03/15  Yes Olugbemiga Essie Christine, MD  oxyCODONE (ROXICODONE) 15 MG immediate release tablet  Take 1 tablet (15 mg total) by mouth every 4 (four) hours as needed for pain. 07/03/15  Yes Tresa Garter, MD  Prenatal Vit-Min-FA-Fish Oil ([facility] PRENATAL GUMMY) 0.4-113.5 MG CHEW Chew 2-3 each by mouth daily.   Yes Historical Provider, MD  cyclobenzaprine (FLEXERIL) 10 MG tablet Take 1 tablet (10 mg total) by mouth 2 (two) times daily as needed for muscle spasms. 07/12/15   Tatyana Kirichenko, PA-C  folic acid (FOLVITE) 1 MG tablet Take 2 tablets (2 mg total) by mouth daily. Patient not taking: Reported on 06/28/2015 03/26/15   [name], MD    Physical Exam: Filed Vitals:   07/11/15 2232 07/12/15 0041  BP: 112/75 110/74  Pulse: 103 98  Temp: 98 F (36.7 C)   TempSrc: Oral   Resp: 20 18  Height: 5\' 1"  (1.549 m)   Weight: 79.379 kg (175 lb)   SpO2: 96% 96%   General: Not in acute distress HEENT:       Eyes: PERRL, EOMI, no scleral icterus.       ENT: No discharge from the ears and nose, no pharynx injection, no tonsillar enlargement.        Neck: No JVD, no bruit, no mass felt. Heme: No neck lymph node enlargement. Cardiac: S1/S2, RRR, No murmurs, No gallops or rubs. Pulm:  No rales, wheezing, rhonchi or rubs. Abd: Soft, nondistended, nontender, no rebound pain, no organomegaly, BS present. Ext: No pitting leg edema bilaterally. 2+DP/PT pulse bilaterally. Musculoskeletal: Severe tenderness over left neck.  Skin: No rashes.  Neuro: Alert, oriented X3, cranial nerves II-XII grossly intact, moves all extremities normally. Psych: Patient is not psychotic, no suicidal or hemocidal ideation.  Labs on Admission:  Basic Metabolic Panel:  Recent Labs Lab 07/06/15 1356 07/11/15 2303  NA 137 134*  K 4.1 3.8  CL 104 104  CO2 23 20*  GLUCOSE 86 89  BUN 5* 10  CREATININE 0.43* 0.72  CALCIUM 8.7 8.7*   Liver Function Tests:  Recent Labs Lab 07/06/15 1356 07/11/15 2303  AST 20 21  ALT 17 19  ALKPHOS 57 55  BILITOT 2.2* 2.1*  PROT 6.5 7.0  ALBUMIN 3.5* 3.6    No results for input(s): LIPASE, AMYLASE in the last 168 hours. No results for input(s): AMMONIA in the last 168 hours. CBC:  Recent Labs Lab 07/06/15 1356 07/11/15 2303  WBC 13.8* 18.5*  NEUTROABS 10.6* 13.4*  HGB 9.4* 9.1*  HCT 28.0* 25.8*  MCV 94.0 92.8  PLT 404* 490*   Cardiac Enzymes: No results for input(s): CKTOTAL, CKMB, CKMBINDEX, TROPONINI in the last 168 hours.  BNP (last 3 results) No results for input(s): BNP in the last 8760 hours.  ProBNP (last 3 results) No results for input(s): PROBNP in the last 8760 hours.  CBG: No results for input(s): GLUCAP in the last 168 hours.  Radiological Exams on Admission: No results found.  Assessment/Plan Principal Problem:   Sickle cell pain  crisis [facility]) Active Problems:   Sickle cell disease ([place])   Leukocytosis   Second trimester pregnancy   Sickle cell crisis ([place])   Sickle cell pain crisis [facility]): Patient has leukocytosis, but does not have signs of infection. No indication for antibiotics. Hemoglobin stable, does not need transfusion at this moment. I discussed with pharmasist regarding the pain management, was told that we can use standard PCA protocol.  -Admit to tele bed given tachycardia. -Start PCA protocol for pain -Folic acid -Benadryl for itch -IVF: 1L NS given in ED, then D5-1/2NS at 100 cc/h -Zofran for nausea  Leukocytosis: no signs of infection. Likely due to stress induced to demargination. Her UA has small bone leukocyte, but no any symptoms of UTI.  -will f/u by CBC -Bx and Ux  Second trimester pregnancy: 23 week pregnancy. No complications. No alarming symptoms or signs for abortion -continue prenatal vitamin -Continue fetal heart rate monitoring every shift.  DVT ppx: SCD Code Status: Full code Family Communication: None at bed side. Disposition Plan: Admit to inpatient   Date of Service 07/12/2015    Ivor Costa [facility] Pager [phone]  If 7PM-7AM, please contact  night-coverage [email or web address] Password [facility] 07/12/2015, 2:29 AM

## 2015-07-13 DIAGNOSIS — Z331 Pregnant state, incidental: Secondary | ICD-10-CM

## 2015-07-13 DIAGNOSIS — N898 Other specified noninflammatory disorders of vagina: Secondary | ICD-10-CM

## 2015-07-13 LAB — COMPREHENSIVE METABOLIC PANEL
ALBUMIN: 3 g/dL — AB (ref 3.5–5.0)
ALK PHOS: 49 U/L (ref 38–126)
ALT: 15 U/L (ref 14–54)
AST: 26 U/L (ref 15–41)
Anion gap: 7 (ref 5–15)
BUN: 6 mg/dL (ref 6–20)
CALCIUM: 8.4 mg/dL — AB (ref 8.9–10.3)
CHLORIDE: 105 mmol/L (ref 101–111)
CO2: 23 mmol/L (ref 22–32)
CREATININE: 0.4 mg/dL — AB (ref 0.44–1.00)
GFR calc non Af Amer: >60 mL/min (ref >60)
GLUCOSE: 92 mg/dL (ref 65–99)
Potassium: 4 mmol/L (ref 3.5–5.1)
SODIUM: 135 mmol/L (ref 135–145)
Total Bilirubin: 2.2 mg/dL — ABNORMAL HIGH (ref 0.3–1.2)
Total Protein: 5.8 g/dL — ABNORMAL LOW (ref 6.5–8.1)

## 2015-07-13 LAB — CBC WITH DIFFERENTIAL/PLATELET
BASOS ABS: 0 10*3/uL (ref 0.0–0.1)
Basophils Relative: 0 %
EOS ABS: 0.3 10*3/uL (ref 0.0–0.7)
Eosinophils Relative: 2 %
HCT: 23.3 % — ABNORMAL LOW (ref 36.0–46.0)
HEMOGLOBIN: 8.1 g/dL — AB (ref 12.0–15.0)
LYMPHS PCT: 9 %
Lymphs Abs: 1.4 10*3/uL (ref 0.7–4.0)
MCH: 32 pg (ref 26.0–34.0)
MCHC: 34.8 g/dL (ref 30.0–36.0)
MCV: 92.1 fL (ref 78.0–100.0)
MONO ABS: 1.4 10*3/uL — AB (ref 0.1–1.0)
MONOS PCT: 9 %
NEUTROS PCT: 80 %
NRBC: 2 /100{WBCs} — AB
Neutro Abs: 12.8 10*3/uL — ABNORMAL HIGH (ref 1.7–7.7)
PLATELETS: ADEQUATE 10*3/uL (ref 150–400)
RBC: 2.53 MIL/uL — AB (ref 3.87–5.11)
RDW: 16.5 % — ABNORMAL HIGH (ref 11.5–15.5)
WBC: 15.9 10*3/uL — AB (ref 4.0–10.5)

## 2015-07-13 LAB — URINE CULTURE: Culture: 1000

## 2015-07-13 MED ORDER — HYDROMORPHONE 1 MG/ML IV SOLN
INTRAVENOUS | Status: DC
  Administered 2015-07-13: 5 mg via INTRAVENOUS
  Administered 2015-07-13: 7 mg via INTRAVENOUS
  Administered 2015-07-13: 5 mg via INTRAVENOUS
  Administered 2015-07-14: 6 mg via INTRAVENOUS
  Administered 2015-07-14: 3 mg via INTRAVENOUS
  Administered 2015-07-14: 5 mg via INTRAVENOUS
  Administered 2015-07-14: 09:00:00 via INTRAVENOUS
  Administered 2015-07-14: 1 mg via INTRAVENOUS
  Filled 2015-07-13: qty 25

## 2015-07-13 MED ORDER — POLYETHYLENE GLYCOL 3350 17 G PO PACK
17.0000 g | PACK | Freq: Every day | ORAL | Status: DC
  Filled 2015-07-13: qty 1

## 2015-07-13 MED ORDER — HYDROMORPHONE HCL 2 MG/ML IJ SOLN
2.0000 mg | INTRAMUSCULAR | Status: DC | PRN
  Administered 2015-07-13 – 2015-07-14 (×7): 2 mg via INTRAVENOUS
  Filled 2015-07-13 (×7): qty 1

## 2015-07-13 NOTE — Progress Notes (Signed)
SICKLE CELL SERVICE PROGRESS NOTE  [name] [id number] DOB: Nov 05, 1990 DOA: 07/11/2015 PCP: Angelica Chessman, MD  Assessment/Plan: Principal Problem:   Sickle cell pain crisis ([place]) Active Problems:   Sickle cell disease ([place])   Leukocytosis   Second trimester pregnancy   Sickle cell crisis ([place])  1. Hb SS with crisis: Pt reports that pain is localized mainly to right shoulder.  She has used 21.2 mg with 33/32:demands/deliveries in addition to 12 mg  the last 23 hours. I will add clinician assisted doses to every 3 hours as needed. Re-assess to wean further tomorrow.  2. Leukocytosis: Likely secondary to crisis. Pt has no overt clinical signs of infection except in vaginal region. 3. Anemia of chronic disease: Hb stable at 8.1 g/dl. I have reviewed her Electrophoresis from 07/06/2015 and at that time her  of HB S was 84.%. Goal during pregnancy is to keep Hb S <30% and Hb levels >8. Will defer to out patient clinician. 4. Vaginal Lesions: Pt has 2 lesions at the base of the labia majora which have a white base and according to patient she thinks started out as a blister. There is associated itching. Will test for herpes.  5. Chronic pain: Continue Methadone 5 mg q HS. 6. 2nd Trimester Pregnancy: Pt is in her 23 rd week of pregnancy. She has not yet established with care with Ob-Gyn. Ob-Rapid response team appreciated. Last hemoglobin electrophoresis on 2/20 showed Hb S % at 84. The goal during pregnancy is a Hb of > 8g/dl and <10 g.dl with a Hb % < 30 so as to maintain proper fetal growth. (Hb > 10 g/dl places patient at risk of hyperviscosity complications).This should be maintained during the entire pregnancy and not just in the 3rd trimester.   Code Status: Full Code Family Communication: N/A Disposition Plan: Not yet ready for discharge  [place].  Pager [phone]. If 7PM-7AM, please contact night-coverage.  07/13/2015, 12:53 PM  LOS: 1 day   Interim  History: Pain improved and is rated as 7/10 and localized to right shoulder.  Consultants:  None  Procedures:  None  Antibiotics:  None    Objective: Filed Vitals:   07/13/15 0200 07/13/15 0552 07/13/15 0717 07/13/15 1200  BP: 116/60 98/60    Pulse: 81 96    Temp: 98 F (36.7 C) 98.2 F (36.8 C)    TempSrc: Oral Oral    Resp: 15 17 18 15   Height:      Weight:      SpO2: 98% 95% 99% 97%   Weight change:   Intake/Output Summary (Last 24 hours) at 07/13/15 1253 Last data filed at 07/13/15 1205  Gross per 24 hour  Intake 1753.34 ml  Output   2650 ml  Net -896.66 ml    General: Alert, awake, oriented x3, in mild distress due to pain.  HEENT: [place]/AT PEERL, EOMI, anicteric Neck: Trachea midline,  no masses, no thyromegal,y no JVD, no carotid bruit OROPHARYNX:  Moist, No exudate/ erythema/lesions.  Heart: Regular rate and rhythm, without murmurs, rubs, gallops, PMI non-displaced, no heaves or thrills on palpation.  Lungs: Clear to auscultation, no wheezing or rhonchi noted. No increased vocal fremitus resonant to percussion  Abdomen: Gravid Neuro: No focal neurological deficits noted cranial nerves II through XII grossly intact.  Strength at functional baseline in bilateral upper and lower extremities. Musculoskeletal: No warm swelling or erythema around joints, no spinal tenderness noted. Psychiatric: Patient alert and oriented x3, good insight and cognition,  good recent to remote recall. Genitalia: External genitalia normal appearing with no evidence of vaginal discharge. Pt does have 3 lesions at the base of the left labia majoris. The lesions are with a white base and non-draining.    Data Reviewed: Basic Metabolic Panel:  Recent Labs Lab 07/06/15 1356 07/11/15 2303 07/12/15 0248 07/13/15 0526  NA 137 134* 139 135  K 4.1 3.8 3.9 4.0  CL 104 104 110 105  CO2 23 20* 21* 23  GLUCOSE 86 89 88 92  BUN 5* 10 8 6   CREATININE 0.43* 0.72 0.51 0.40*  CALCIUM  8.7 8.7* 8.2* 8.4*  MG  --   --  1.8  --    Liver Function Tests:  Recent Labs Lab 07/06/15 1356 07/11/15 2303 07/12/15 0248 07/13/15 0526  AST 20 21 24 26   ALT 17 19 20 15   ALKPHOS 57 55 50 49  BILITOT 2.2* 2.1* 1.8* 2.2*  PROT 6.5 7.0 6.1* 5.8*  ALBUMIN 3.5* 3.6 3.0* 3.0*   No results for input(s): LIPASE, AMYLASE in the last 168 hours. No results for input(s): AMMONIA in the last 168 hours. CBC:  Recent Labs Lab 07/06/15 1356 07/11/15 2303 07/13/15 0526  WBC 13.8* 18.5* 15.9*  NEUTROABS 10.6* 13.4* 12.8*  HGB 9.4* 9.1* 8.1*  HCT 28.0* 25.8* 23.3*  MCV 94.0 92.8 92.1  PLT 404* 490* PLATELET CLUMPS NOTED ON SMEAR, COUNT APPEARS ADEQUATE   Cardiac Enzymes: No results for input(s): CKTOTAL, CKMB, CKMBINDEX, TROPONINI in the last 168 hours. BNP (last 3 results) No results for input(s): BNP in the last 8760 hours.  ProBNP (last 3 results) No results for input(s): PROBNP in the last 8760 hours.  CBG: No results for input(s): GLUCAP in the last 168 hours.  Recent Results (from the past 240 hour(s))  Urine culture     Status: None   Collection Time: 07/12/15  1:28 PM  Result Value Ref Range Status   Specimen Description URINE, CLEAN CATCH  Final   Special Requests NONE  Final   Culture   Final    1,000 COLONIES/mL INSIGNIFICANT GROWTH Performed at [facility]    Report Status 07/13/2015 FINAL  Final        Scheduled Meds: . folic acid  1 mg Oral Daily  . HYDROmorphone   Intravenous 6 times per day  . methadone  5 mg Oral QHS  . polyethylene glycol  17 g Oral Daily  . prenatal multivitamin  1 tablet Oral Q1200  . senna-docusate  1 tablet Oral BID   Continuous Infusions: . dextrose 5 % and 0.45% NaCl 100 mL/hr at 07/12/15 1427  . dextrose 5 % and 0.45% NaCl     Time spent 25 minutes.

## 2015-07-14 ENCOUNTER — Telehealth: Payer: Self-pay | Admitting: *Deleted

## 2015-07-14 DIAGNOSIS — B009 Herpesviral infection, unspecified: Secondary | ICD-10-CM | POA: Diagnosis present

## 2015-07-14 DIAGNOSIS — D571 Sickle-cell disease without crisis: Secondary | ICD-10-CM

## 2015-07-14 MED ORDER — OXYCODONE HCL 5 MG PO TABS
15.0000 mg | ORAL_TABLET | ORAL | Status: DC
  Administered 2015-07-14: 15 mg via ORAL
  Filled 2015-07-14: qty 3

## 2015-07-14 NOTE — Progress Notes (Signed)
Asked to look into chart and make recommendations. Patient has not had any prenatal care thus far.  She is missing a lot of needed care due to frequent hospitalization. She will need growth U/s q 4 wks after anatomy is completed. I will order a prenatal panel today. She will need a 1 hour glucola (50gm load) at 28 wks. Please keep [place] informed of her progress at [phone].

## 2015-07-14 NOTE — Progress Notes (Signed)
QD doppler performed.  Patient denies vaginal bleeding or leaking of fluid.  Denies cramping or contractions.

## 2015-07-14 NOTE — Telephone Encounter (Signed)
Pt called and needs a refill of her Oxycodone. Pt states that she is going out of town March 10 - March 26th. Her next refill will be due March 17th. She states if she can get another RX dated for March 17th so she can take with her to [place] to the pharamcy to get filled when it is due. Or can she get a 30 day supply of her medicine when she picks up her RX on Friday. Please advise provider. Thanks

## 2015-07-14 NOTE — Discharge Summary (Signed)
[name] MRN: [id number] DOB/AGE: [birth year] 25 y.o.  Admit date: 07/11/2015 Discharge date: 07/14/2015  Primary Care Physician:  Angelica Chessman, MD   Discharge Diagnoses:   Patient Active Problem List   Diagnosis Date Noted  . Mood swings ([place]) 11/08/2011    Priority: High  . Depression 01/06/2011    Priority: High  . Sickle cell disease ([place]) 01/08/2009    Priority: High  . Maternal sickle cell anemia affecting pregnancy, antepartum ([place]) 07/14/2015  . Sickle cell pain crisis ([place]) 07/12/2015  . Sickle cell crisis ([place]) 07/12/2015  . Second trimester pregnancy 07/07/2015  . 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 Anemia of chronic disease   . Leukocytosis   . Migraines 11/08/2011  . GERD (gastroesophageal reflux disease) 02/17/2011  . 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     folic acid 1 MG tablet  Commonly known as:  FOLVITE  Take 2 tablets (2 mg total) by mouth daily.     methadone 5 MG tablet  Commonly known as:  DOLOPHINE  Take 1 tablet (5 mg total) by mouth at bedtime.     oxyCODONE 15 MG immediate release tablet  Commonly known as:  ROXICODONE  Take 1 tablet (15 mg total) by mouth every 4 (four) hours as needed for pain.          Consults: Treatment Team:  Donnamae Jude, MD   SIGNIFICANT DIAGNOSTIC STUDIES:  Ir Fluoro Guide Cv Line Left  06/30/2015  INDICATION: 25 year old with sickle cell crisis and needs central venous access. The patient is pregnant. Patient's abdomen was shielded for this procedure. EXAM: PICC LINE PLACEMENT WITH ULTRASOUND AND FLUOROSCOPIC GUIDANCE MEDICATIONS: None ANESTHESIA/SEDATION: None FLUOROSCOPY TIME:  Fluoroscopy Time: 6 seconds.  0.6 mGy COMPLICATIONS: None immediate. PROCEDURE: The patient was advised of the possible risks and complications and agreed to undergo the  procedure. In particular, the risks of radiation exposure and pregnancy were discussed the patient. The patient was then brought to the angiographic suite for the procedure. The right arm was prepped with chlorhexidine, draped in the usual sterile fashion using maximum barrier technique (cap and mask, sterile gown, sterile gloves, large sterile sheet, hand hygiene and cutaneous antiseptic). Local anesthesia was attained by infiltration with 1% lidocaine. Complex venous anatomy in the right upper arm. Patient may have an early radial artery takeoff. Attempted to cannulate multiple veins in the right upper arm with ultrasound guidance but these attempts were unsuccessful. Therefore, attention was directed to the left arm. Left arm was prepped and draped in sterile fashion. Ultrasound demonstrated patency of the left brachial vein, and this was documented with an image. Under real-time ultrasound guidance, this vein was accessed with a 21 gauge micropuncture needle and image documentation was performed. The needle was exchanged over a guidewire for a peel-away sheath through which a 43 cm 5 [place] dual lumen power injectable PICC was advanced, and positioned with its tip at the lower SVC/right atrial junction. Fluoroscopy during the procedure and fluoro spot radiograph confirms appropriate catheter position. The catheter was flushed, secured to the skin, and covered with a sterile dressing. IMPRESSION: Successful placement of a left arm PICC with sonographic and fluoroscopic guidance. The catheter is ready for use. Electronically Signed   By: Markus Daft M.D.   On: 06/30/2015 17:27  Ir US Guide Vasc Access Left  06/30/2015  INDICATION: 25 year old with sickle cell crisis and needs central venous access. The patient is pregnant. Patient's abdomen was shielded for this procedure. EXAM: PICC LINE PLACEMENT WITH ULTRASOUND AND FLUOROSCOPIC GUIDANCE MEDICATIONS: None ANESTHESIA/SEDATION: None FLUOROSCOPY TIME:   Fluoroscopy Time: 6 seconds.  0.6 mGy COMPLICATIONS: None immediate. PROCEDURE: The patient was advised of the possible risks and complications and agreed to undergo the procedure. In particular, the risks of radiation exposure and pregnancy were discussed the patient. The patient was then brought to the angiographic suite for the procedure. The right arm was prepped with chlorhexidine, draped in the usual sterile fashion using maximum barrier technique (cap and mask, sterile gown, sterile gloves, large sterile sheet, hand hygiene and cutaneous antiseptic). Local anesthesia was attained by infiltration with 1% lidocaine. Complex venous anatomy in the right upper arm. Patient may have an early radial artery takeoff. Attempted to cannulate multiple veins in the right upper arm with ultrasound guidance but these attempts were unsuccessful. Therefore, attention was directed to the left arm. Left arm was prepped and draped in sterile fashion. Ultrasound demonstrated patency of the left brachial vein, and this was documented with an image. Under real-time ultrasound guidance, this vein was accessed with a 21 gauge micropuncture needle and image documentation was performed. The needle was exchanged over a guidewire for a peel-away sheath through which a 43 cm 5 [place] dual lumen power injectable PICC was advanced, and positioned with its tip at the lower SVC/right atrial junction. Fluoroscopy during the procedure and fluoro spot radiograph confirms appropriate catheter position. The catheter was flushed, secured to the skin, and covered with a sterile dressing. IMPRESSION: Successful placement of a left arm PICC with sonographic and fluoroscopic guidance. The catheter is ready for use. Electronically Signed   By: Markus Daft M.D.   On: 06/30/2015 17:27       Recent Results (from the past 240 hour(s))  Culture, blood (routine x 2)     Status: None (Preliminary result)   Collection Time: 07/12/15  2:40 AM  Result  Value Ref Range Status   Specimen Description BLOOD RIGHT WRIST  Final   Special Requests IN PEDIATRIC BOTTLE 2ML  Final   Culture   Final    NO GROWTH 1 DAY Performed at [facility]    Report Status PENDING  Incomplete  Culture, blood (routine x 2)     Status: None (Preliminary result)   Collection Time: 07/12/15  2:45 AM  Result Value Ref Range Status   Specimen Description BLOOD BLOOD RIGHT HAND  Final   Special Requests IN PEDIATRIC BOTTLE 2ML  Final   Culture   Final    NO GROWTH 1 DAY Performed at [facility]    Report Status PENDING  Incomplete  Urine culture     Status: None   Collection Time: 07/12/15  1:28 PM  Result Value Ref Range Status   Specimen Description URINE, CLEAN CATCH  Final   Special Requests NONE  Final   Culture   Final    1,000 COLONIES/mL INSIGNIFICANT GROWTH Performed at [facility]    Report Status 07/13/2015 FINAL  Final    BRIEF ADMITTING H & P: [name] is a 25 y.o. female with PMH of sickle cell disease, chronic back pain, migraine headache, miscarriage, GERD, depression, 23 week pregnancy, who presents with bilateral shoulder pain and left neck pain.  Patient reports that she has been having left  neck pain in the past 2 weeks, which is not relieved with her home oral pain medications. She started having left shoulder pain in the past 2, which has been progressively getting worse. Patient does not have weakness or numbness in her arms. No injury to her shoulder or neck. She does not have chest pain, cough, shortness breath, abdominal pain or diarrhea. She denies any symptoms of her UTI. She has chronic back pain which has not changed. Regarding her pregnancy, patient does not have any complications and no vaginal bleeding. Fetus moves normally.  In ED, patient was found to have WBC 18.5, stable hemoglobin 9.4 on 07/06/15-->9.1, urinalysis with small month leukocytes, transient tachycardia, electrolytes and renal  function okay. Patient is admitted to inpatient for further interventional treatment.   Hospital Course:  Present on Admission:  . Sickle cell pain crisis [facility]): Patient's pain was treated with Dilaudid via PCA, clinician assisted doses of Dilantin and IV fluids. The patient is unable to receive any NSAIDs due to her state of pregnancy. As the pain improve she was transitioned to oral oxycodone. Methadone 5 mg daily at bedtime was continued throughout hospital stay. At the time of discharge the patient's pain was at a level of 4-5 out of 10 which is at her baseline on a daily basis.  . Second trimester pregnancy: Patient has missed all her OB appointment due to hospitalizations. This patient is having frequent hospitalizations with her crisis. Her last hemoglobin electrophoresis shows that her hemoglobin S levels are greater than 80%. This places the patient and the fetus as risk for complications throughout the pregnancy. It is recommended that the patient should have either straight transfusions or exchange transfusions dependent upon her level of hemoglobin and probably about every 4-6 week interval to decrease the percentage of hemoglobin S, decrease hospitalizations throughout her pregnancy and improve oxygenation to the placenta was improved in growth of the fetus. I have spoken with Dr. Kennon Rounds who reviewed the patient's chart and recommended ultrasound every 4 weeks after the anatomy is completed to review growth of the fetus. I've also spoken with her primary care provider's practitioner Marcelene Butte who indicates that the patient is scheduled for a fetal ultrasound in the near future. I prenatal panel was ordered by Dr. Kennon Rounds today and is currently in process.  . Vaginal lesion: The patient has a vaginal lesion on the base of the labia majora on the left. She describes the original lesion is having a blister. This could very well be a herpetic lesion. Swabs and sent off for herpes PCR the lesion and  are pending at this time. This needs to be followed-up by her primary care physician and/or OB given the possible risks to the fetus.  . Leukocytosis: Patient had a mild leukocytosis without any evidence of an acute infection. It is likely related to her crisis however given the vaginal lesion this could also be related to a possible vaginal infection.  . chronic pain syndrome: Continue methadone 5 mg daily at bedtime.      Disposition and Follow-up:  Patient is discharged home in good condition. She's to follow-up with appointments as previously scheduled.     Discharge Instructions    Diet general    Complete by:  As directed      Increase activity slowly    Complete by:  As directed            DISCHARGE EXAM:  General: Alert, awake, oriented x3, in mild distress. Vital signs:  BP 92/46, HR 90, T 98.3 F (36.8 C), temperature source Oral, RR 14, height 5\' 1"  (1.549 m), weight 175 lb (79.379 kg), last menstrual period 02/09/2015, SpO2 95 % on RA, not currently breastfeeding. HEENT: Media/AT PEERL, EOMI, anicteric Neck: Trachea midline, no masses, no thyromegal,y no JVD, no carotid bruit OROPHARYNX: Moist, No exudate/ erythema/lesions.  Heart: Regular rate and rhythm, without murmurs, rubs, gallops or S3. PMI non-displaced. Exam reveals no decreased pulses. Pulmonary/Chest: Normal effort. Breath sounds normal. No. Apnea. Clear to auscultation,no stridor,  no wheezing and no rhonchi noted. No respiratory distress and no tenderness noted. Abdomen: Gravid  Neuro: Alert and oriented to person, place and time. Normal motor skills, Displays no atrophy or tremors and exhibits normal muscle tone.  No focal neurological deficits noted cranial nerves II through XII grossly intact. No sensory deficit noted. DTRs 2+ bilaterally upper and lower extremities. Strength at baseline in bilateral upper and lower extremities. Gait normal. Musculoskeletal: No warm swelling or erythema around joints, no  spinal tenderness noted. Psychiatric: Patient alert and oriented x3, good insight and cognition, good recent to remote recall. Lymph node survey: No cervical axillary or inguinal lymphadenopathy noted. Skin: Skin is warm and dry. No bruising, no ecchymosis and no rash noted. Pt is not diaphoretic. No erythema. No pallor Genitalia: External genitalia normal. Vaginal vault shows healthy tissue on visual inspection. No foul smelling discharge. Pt exhibits good lubrication with physiological discharge. No adnexal tenderness noted however there is a soft tissue mass felt on the anterior left vaginal vault.  Psychiatric: Mood, memory, affect and judgement normal     Recent Labs  07/12/15 0248 07/13/15 0526  NA 139 135  K 3.9 4.0  CL 110 105  CO2 21* 23  GLUCOSE 88 92  BUN 8 6  CREATININE 0.51 0.40*  CALCIUM 8.2* 8.4*  MG 1.8  --     Recent Labs  07/12/15 0248 07/13/15 0526  AST 24 26  ALT 20 15  ALKPHOS 50 49  BILITOT 1.8* 2.2*  PROT 6.1* 5.8*  ALBUMIN 3.0* 3.0*   No results for input(s): LIPASE, AMYLASE in the last 72 hours.  Recent Labs  07/11/15 2303 07/13/15 0526  WBC 18.5* 15.9*  NEUTROABS 13.4* 12.8*  HGB 9.1* 8.1*  HCT 25.8* 23.3*  MCV 92.8 92.1  PLT 490* PLATELET CLUMPS NOTED ON SMEAR, COUNT APPEARS ADEQUATE     Total time spent including face to face and decision making was greater than 30 minutes  Signed: MATTHEWS,MICHELLE A. 07/14/2015, 12:56 PM

## 2015-07-14 NOTE — Telephone Encounter (Signed)
Refill request for oxycodone. Please see note below from [facility]. LOV 07/06/2015. Please advise. Thanks!

## 2015-07-15 ENCOUNTER — Ambulatory Visit (HOSPITAL_COMMUNITY)
Admission: RE | Admit: 2015-07-15 | Discharge: 2015-07-15 | Disposition: A | Discharge status: Home / Self Care | Payer: Medicare Other | Source: Ambulatory Visit | Attending: Family Medicine | Admitting: Family Medicine

## 2015-07-15 ENCOUNTER — Other Ambulatory Visit: Payer: Self-pay | Admitting: Family Medicine

## 2015-07-15 ENCOUNTER — Encounter (HOSPITAL_COMMUNITY): Payer: Self-pay

## 2015-07-15 VITALS — BP 127/67 | HR 104 | Wt 172.4 lb

## 2015-07-15 DIAGNOSIS — O0932 Supervision of pregnancy with insufficient antenatal care, second trimester: Secondary | ICD-10-CM | POA: Diagnosis not present

## 2015-07-15 DIAGNOSIS — O99322 Drug use complicating pregnancy, second trimester: Secondary | ICD-10-CM | POA: Diagnosis not present

## 2015-07-15 DIAGNOSIS — Z3689 Encounter for other specified antenatal screening: Secondary | ICD-10-CM

## 2015-07-15 DIAGNOSIS — D571 Sickle-cell disease without crisis: Secondary | ICD-10-CM

## 2015-07-15 DIAGNOSIS — O99019 Anemia complicating pregnancy, unspecified trimester: Secondary | ICD-10-CM

## 2015-07-15 DIAGNOSIS — Z3A23 23 weeks gestation of pregnancy: Secondary | ICD-10-CM

## 2015-07-15 DIAGNOSIS — Z3A22 22 weeks gestation of pregnancy: Secondary | ICD-10-CM | POA: Diagnosis not present

## 2015-07-15 DIAGNOSIS — F1110 Opioid abuse, uncomplicated: Secondary | ICD-10-CM | POA: Insufficient documentation

## 2015-07-15 DIAGNOSIS — O99012 Anemia complicating pregnancy, second trimester: Secondary | ICD-10-CM | POA: Diagnosis not present

## 2015-07-15 DIAGNOSIS — Z36 Encounter for antenatal screening of mother: Secondary | ICD-10-CM | POA: Diagnosis present

## 2015-07-15 DIAGNOSIS — Z3492 Encounter for supervision of normal pregnancy, unspecified, second trimester: Secondary | ICD-10-CM

## 2015-07-15 LAB — HERPES SIMPLEX VIRUS(HSV) DNA BY PCR
HSV 1 DNA: NEGATIVE
HSV 2 DNA: POSITIVE — AB

## 2015-07-15 LAB — HEPATITIS B SURFACE ANTIGEN: Hepatitis B Surface Ag: NEGATIVE

## 2015-07-15 LAB — RUBELLA SCREEN: Rubella: 4.65 index (ref >0.99)

## 2015-07-15 LAB — HIV ANTIBODY (ROUTINE TESTING W REFLEX): HIV Screen 4th Generation wRfx: NONREACTIVE

## 2015-07-15 LAB — RPR: RPR: NONREACTIVE

## 2015-07-15 MED ORDER — OXYCODONE HCL 15 MG PO TABS: 15.0000 mg | ORAL_TABLET | ORAL | Status: DC | PRN

## 2015-07-15 NOTE — Telephone Encounter (Signed)
Reviewed [place] Substance Reporting system prior to prescribing opiate opiate medications, no inconsistencies noted.  Patient will be out of town for 2 week and is requesting a post dated prescription. I will not be able to write that at this time.  Meds ordered this encounter  Medications  . oxyCODONE (ROXICODONE) 15 MG immediate release tablet    Sig: Take 1 tablet (15 mg total) by mouth every 4 (four) hours as needed for pain.    Dispense:  90 tablet    Refill:  0    Order Specific Question:  Supervising Provider    Answer:  Tresa Garter [id number]    Dorena Dew, FNP

## 2015-07-17 ENCOUNTER — Telehealth: Payer: Self-pay | Admitting: *Deleted

## 2015-07-17 ENCOUNTER — Telehealth: Payer: Self-pay

## 2015-07-17 LAB — CULTURE, BLOOD (ROUTINE X 2)
CULTURE: NO GROWTH
Culture: NO GROWTH

## 2015-07-17 MED FILL — oxyCODONE HCL 15 MG TABS: 15 | 15 days supply | Qty: 90 | Fill #0

## 2015-07-17 NOTE — Telephone Encounter (Signed)
-----   Message from Dorena Dew, [place] sent at 07/17/2015 11:31 AM EST ----- Regarding: neck pain Yes, there are several that are pregnancy category B. However, she will have to be assessed prior to prescribing.  In the mean time, she can apply warm compresses to neck as needed. Do not add any OTC medications to current med regimen.   Thanks

## 2015-07-17 NOTE — Telephone Encounter (Signed)
Timmothy Sours, Will you call and schedule an appointment for patient to come in for a visit for this? Thanks!

## 2015-07-17 NOTE — Telephone Encounter (Signed)
Pt is complaining of neck pain. Pt wants to know if there is a RX for a muscle relaxer that she can take while she is pregnant. Please advise provider. Thanks

## 2015-07-17 NOTE — Telephone Encounter (Signed)
[place] PLEASE ADVISE. THANKS!

## 2015-07-20 ENCOUNTER — Encounter: Payer: Medicare Other | Admitting: Family Medicine

## 2015-07-20 ENCOUNTER — Encounter: Payer: Self-pay | Admitting: Family Medicine

## 2015-07-20 IMAGING — XA IR FLUORO GUIDE CV LINE*L*
1 series · 2 of 2 positions shown · non-contrast
Comparison: none

CLINICAL DATA: Sickle cell disease, pregnant, needs IV access

EXAM:
PICC PLACEMENT WITH ULTRASOUND AND FLUOROSCOPY
FLUOROSCOPY TIME:  0.1 minutes, 0.4 mGy
TECHNIQUE: After written informed consent was obtained, patient was placed in
the supine position on angiographic table. Patency of the left
brachial vein was confirmed with ultrasound with image
documentation. An appropriate skin site was determined. Skin site
was marked. Region was prepped using maximum barrier technique
including cap and mask, sterile gown, sterile gloves, large sterile
sheet, and Chlorhexidine as cutaneous antisepsis. The region was
infiltrated locally with 1% lidocaine. Under real-time ultrasound
guidance, the left brachial vein was accessed with a 21 gauge
micropuncture needle; the needle tip within the vein was confirmed
with ultrasound image documentation. Needle exchanged over a 018
guidewire for a peel-away sheath, through which a 5-French
double-lumen power injectable PICC trimmed to 38cm was advanced,
positioned with its tip near the cavoatrial junction. Spot chest
radiograph confirms appropriate catheter position. Catheter was
flushed per protocol and secured externally. The patient tolerated
procedure well.
COMPLICATIONS:
COMPLICATIONS
none

[Series 300: fl - angio · 2 of 2 slices shown]
[im 1/2]
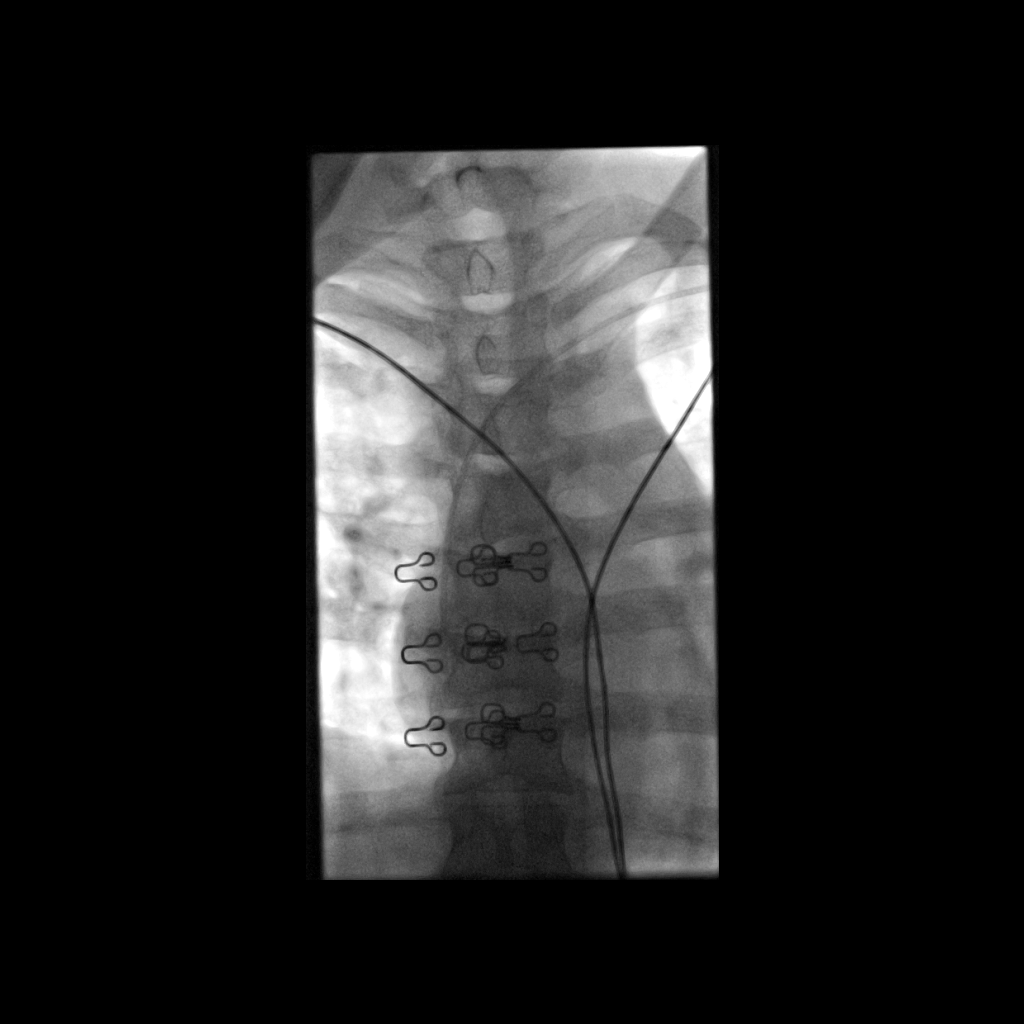
[im 2/2]
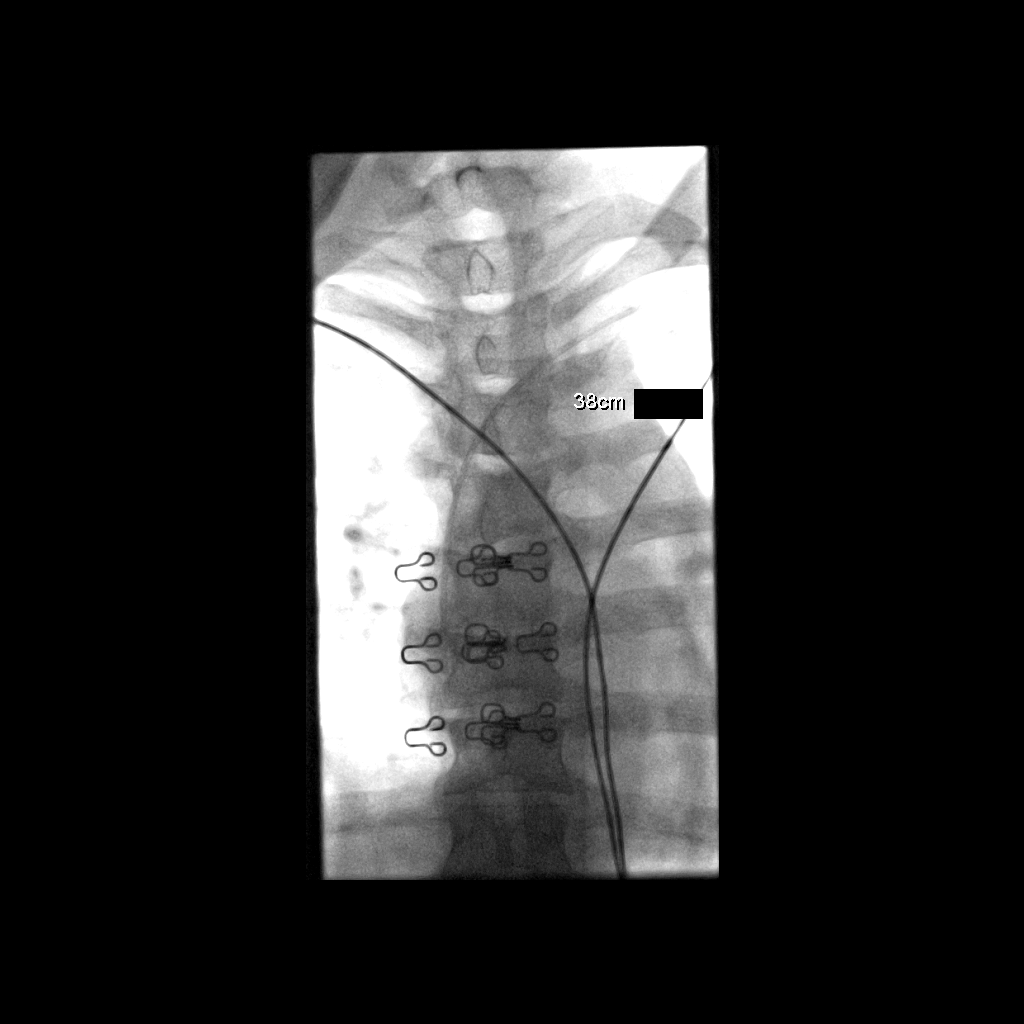

[2 of 2 positions shown; findings below may reference images not displayed]

IMPRESSION: 1. Technically successful five French double lumen power injectable
PICC placement

## 2015-07-20 IMAGING — US IR US GUIDE VASC ACCESS LEFT
1 series · 1 of 1 positions shown · non-contrast
Comparison: none

CLINICAL DATA: Sickle cell disease, pregnant, needs IV access

EXAM:
PICC PLACEMENT WITH ULTRASOUND AND FLUOROSCOPY
FLUOROSCOPY TIME:  0.1 minutes, 0.4 mGy
TECHNIQUE: After written informed consent was obtained, patient was placed in
the supine position on angiographic table. Patency of the left
brachial vein was confirmed with ultrasound with image
documentation. An appropriate skin site was determined. Skin site
was marked. Region was prepped using maximum barrier technique
including cap and mask, sterile gown, sterile gloves, large sterile
sheet, and Chlorhexidine as cutaneous antisepsis. The region was
infiltrated locally with 1% lidocaine. Under real-time ultrasound
guidance, the left brachial vein was accessed with a 21 gauge
micropuncture needle; the needle tip within the vein was confirmed
with ultrasound image documentation. Needle exchanged over a 018
guidewire for a peel-away sheath, through which a 5-French
double-lumen power injectable PICC trimmed to 38cm was advanced,
positioned with its tip near the cavoatrial junction. Spot chest
radiograph confirms appropriate catheter position. Catheter was
flushed per protocol and secured externally. The patient tolerated
procedure well.
COMPLICATIONS:
COMPLICATIONS
none

[Series 1: ir us guide vasc access left · 1 of 1 slices shown]
[im 1/1]
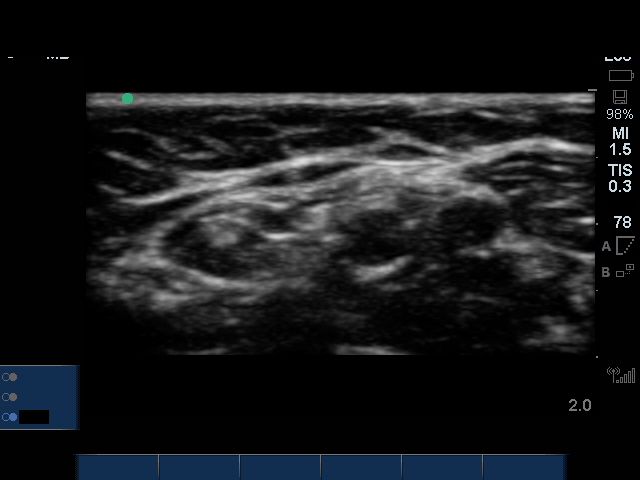

[1 of 1 positions shown; findings below may reference images not displayed]

IMPRESSION: 1. Technically successful five French double lumen power injectable
PICC placement

## 2015-07-23 ENCOUNTER — Encounter (HOSPITAL_COMMUNITY): Payer: Self-pay | Admitting: *Deleted

## 2015-07-23 ENCOUNTER — Ambulatory Visit: Payer: Medicare Other | Admitting: Family Medicine

## 2015-07-23 ENCOUNTER — Inpatient Hospital Stay (HOSPITAL_COMMUNITY)
Admission: AD | Admit: 2015-07-23 | Discharge: 2015-08-02 | DRG: 781 | Disposition: A | Discharge status: Home / Self Care | Payer: Medicare Other | Source: Ambulatory Visit | Attending: Internal Medicine | Admitting: Internal Medicine

## 2015-07-23 DIAGNOSIS — Z87891 Personal history of nicotine dependence: Secondary | ICD-10-CM

## 2015-07-23 DIAGNOSIS — E876 Hypokalemia: Secondary | ICD-10-CM | POA: Diagnosis present

## 2015-07-23 DIAGNOSIS — Z8249 Family history of ischemic heart disease and other diseases of the circulatory system: Secondary | ICD-10-CM

## 2015-07-23 DIAGNOSIS — Z833 Family history of diabetes mellitus: Secondary | ICD-10-CM

## 2015-07-23 DIAGNOSIS — D5700 Hb-SS disease with crisis, unspecified: Secondary | ICD-10-CM | POA: Diagnosis not present

## 2015-07-23 DIAGNOSIS — O99322 Drug use complicating pregnancy, second trimester: Secondary | ICD-10-CM | POA: Diagnosis present

## 2015-07-23 DIAGNOSIS — Z3A25 25 weeks gestation of pregnancy: Secondary | ICD-10-CM

## 2015-07-23 DIAGNOSIS — R768 Other specified abnormal immunological findings in serum: Secondary | ICD-10-CM | POA: Diagnosis present

## 2015-07-23 DIAGNOSIS — D571 Sickle-cell disease without crisis: Secondary | ICD-10-CM

## 2015-07-23 DIAGNOSIS — O99012 Anemia complicating pregnancy, second trimester: Secondary | ICD-10-CM | POA: Diagnosis not present

## 2015-07-23 DIAGNOSIS — F1490 Cocaine use, unspecified, uncomplicated: Secondary | ICD-10-CM | POA: Diagnosis present

## 2015-07-23 DIAGNOSIS — E860 Dehydration: Secondary | ICD-10-CM | POA: Diagnosis present

## 2015-07-23 DIAGNOSIS — A0819 Acute gastroenteropathy due to other small round viruses: Secondary | ICD-10-CM | POA: Diagnosis present

## 2015-07-23 DIAGNOSIS — O99019 Anemia complicating pregnancy, unspecified trimester: Secondary | ICD-10-CM

## 2015-07-23 MED ORDER — ONDANSETRON HCL 4 MG/2ML IJ SOLN
4.0000 mg | Freq: Four times a day (QID) | INTRAMUSCULAR | Status: DC | PRN
  Administered 2015-07-24: 4 mg via INTRAVENOUS
  Filled 2015-07-23: qty 2

## 2015-07-23 MED ORDER — SODIUM CHLORIDE 0.9 % IV SOLN
INTRAVENOUS | Status: DC
  Administered 2015-07-24 – 2015-07-25 (×4): via INTRAVENOUS

## 2015-07-23 NOTE — MAU Note (Signed)
Pt reports constant vomiting and diarrhea since 12 noon today, states the N/V has triggered her SS pain.

## 2015-07-24 ENCOUNTER — Encounter (HOSPITAL_COMMUNITY): Payer: Self-pay | Admitting: Anesthesiology

## 2015-07-24 ENCOUNTER — Encounter (HOSPITAL_COMMUNITY): Payer: Self-pay | Admitting: *Deleted

## 2015-07-24 ENCOUNTER — Inpatient Hospital Stay: Admission: AD | Admit: 2015-07-24 | Payer: Medicare Other | Source: Ambulatory Visit | Admitting: Internal Medicine

## 2015-07-24 DIAGNOSIS — D72829 Elevated white blood cell count, unspecified: Secondary | ICD-10-CM | POA: Diagnosis not present

## 2015-07-24 DIAGNOSIS — Z87891 Personal history of nicotine dependence: Secondary | ICD-10-CM | POA: Diagnosis not present

## 2015-07-24 DIAGNOSIS — Z8249 Family history of ischemic heart disease and other diseases of the circulatory system: Secondary | ICD-10-CM | POA: Diagnosis not present

## 2015-07-24 DIAGNOSIS — O99012 Anemia complicating pregnancy, second trimester: Principal | ICD-10-CM

## 2015-07-24 DIAGNOSIS — E860 Dehydration: Secondary | ICD-10-CM | POA: Diagnosis present

## 2015-07-24 DIAGNOSIS — E876 Hypokalemia: Secondary | ICD-10-CM | POA: Diagnosis present

## 2015-07-24 DIAGNOSIS — R894 Abnormal immunological findings in specimens from other organs, systems and tissues: Secondary | ICD-10-CM | POA: Diagnosis not present

## 2015-07-24 DIAGNOSIS — Z833 Family history of diabetes mellitus: Secondary | ICD-10-CM | POA: Diagnosis not present

## 2015-07-24 DIAGNOSIS — Z3A25 25 weeks gestation of pregnancy: Secondary | ICD-10-CM | POA: Diagnosis not present

## 2015-07-24 DIAGNOSIS — A0819 Acute gastroenteropathy due to other small round viruses: Secondary | ICD-10-CM | POA: Diagnosis present

## 2015-07-24 DIAGNOSIS — F1490 Cocaine use, unspecified, uncomplicated: Secondary | ICD-10-CM

## 2015-07-24 DIAGNOSIS — D638 Anemia in other chronic diseases classified elsewhere: Secondary | ICD-10-CM

## 2015-07-24 DIAGNOSIS — O99322 Drug use complicating pregnancy, second trimester: Secondary | ICD-10-CM | POA: Diagnosis present

## 2015-07-24 DIAGNOSIS — A0811 Acute gastroenteropathy due to Norwalk agent: Secondary | ICD-10-CM | POA: Diagnosis not present

## 2015-07-24 DIAGNOSIS — R768 Other specified abnormal immunological findings in serum: Secondary | ICD-10-CM | POA: Diagnosis present

## 2015-07-24 DIAGNOSIS — F1410 Cocaine abuse, uncomplicated: Secondary | ICD-10-CM | POA: Diagnosis not present

## 2015-07-24 DIAGNOSIS — D5700 Hb-SS disease with crisis, unspecified: Secondary | ICD-10-CM | POA: Diagnosis not present

## 2015-07-24 LAB — RAPID URINE DRUG SCREEN, HOSP PERFORMED
Amphetamines: NOT DETECTED
Barbiturates: NOT DETECTED
Benzodiazepines: NOT DETECTED
COCAINE: POSITIVE — AB
OPIATES: POSITIVE — AB
Tetrahydrocannabinol: NOT DETECTED

## 2015-07-24 LAB — COMPREHENSIVE METABOLIC PANEL
ALK PHOS: 60 U/L (ref 38–126)
ALT: 28 U/L (ref 14–54)
AST: 24 U/L (ref 15–41)
Albumin: 3 g/dL — ABNORMAL LOW (ref 3.5–5.0)
Anion gap: 5 (ref 5–15)
BILIRUBIN TOTAL: 2.6 mg/dL — AB (ref 0.3–1.2)
CALCIUM: 7.9 mg/dL — AB (ref 8.9–10.3)
CO2: 21 mmol/L — ABNORMAL LOW (ref 22–32)
CREATININE: 0.43 mg/dL — AB (ref 0.44–1.00)
Chloride: 109 mmol/L (ref 101–111)
GFR calc Af Amer: >60 mL/min (ref >60)
Glucose, Bld: 98 mg/dL (ref 65–99)
Potassium: 3.2 mmol/L — ABNORMAL LOW (ref 3.5–5.1)
Sodium: 135 mmol/L (ref 135–145)
TOTAL PROTEIN: 6.4 g/dL — AB (ref 6.5–8.1)

## 2015-07-24 LAB — RETICULOCYTES
RBC.: 2.5 MIL/uL — AB (ref 3.87–5.11)
RETIC COUNT ABSOLUTE: 417.5 10*3/uL — AB (ref 19.0–186.0)
Retic Ct Pct: 16.7 % — ABNORMAL HIGH (ref 0.4–3.1)

## 2015-07-24 LAB — LACTATE DEHYDROGENASE: LDH: 209 U/L — ABNORMAL HIGH (ref 98–192)

## 2015-07-24 LAB — CBC WITH DIFFERENTIAL/PLATELET
BASOS ABS: 0 10*3/uL (ref 0.0–0.1)
Basophils Relative: 0 %
EOS ABS: 0.1 10*3/uL (ref 0.0–0.7)
EOS PCT: 0 %
HCT: 22.3 % — ABNORMAL LOW (ref 36.0–46.0)
Hemoglobin: 7.8 g/dL — ABNORMAL LOW (ref 12.0–15.0)
Lymphocytes Relative: 9 %
Lymphs Abs: 1.5 10*3/uL (ref 0.7–4.0)
MCH: 31.3 pg (ref 26.0–34.0)
MCHC: 35 g/dL (ref 30.0–36.0)
MCV: 89.6 fL (ref 78.0–100.0)
Monocytes Absolute: 1 10*3/uL (ref 0.1–1.0)
Monocytes Relative: 7 %
Neutro Abs: 13.2 10*3/uL — ABNORMAL HIGH (ref 1.7–7.7)
Neutrophils Relative %: 84 %
PLATELETS: 483 10*3/uL — AB (ref 150–400)
RBC: 2.49 MIL/uL — AB (ref 3.87–5.11)
RDW: 15.8 % — ABNORMAL HIGH (ref 11.5–15.5)
WBC: 15.8 10*3/uL — AB (ref 4.0–10.5)

## 2015-07-24 LAB — MAGNESIUM: MAGNESIUM: 1.8 mg/dL (ref 1.7–2.4)

## 2015-07-24 LAB — TYPE AND SCREEN
ABO/RH(D): B POS
Antibody Screen: NEGATIVE

## 2015-07-24 LAB — OCCULT BLOOD X 1 CARD TO LAB, STOOL: Fecal Occult Bld: NEGATIVE

## 2015-07-24 MED ORDER — LOPERAMIDE HCL 2 MG PO CAPS
2.0000 mg | ORAL_CAPSULE | ORAL | Status: DC | PRN
  Filled 2015-07-24: qty 1

## 2015-07-24 MED ORDER — HYDROMORPHONE HCL 1 MG/ML IJ SOLN
1.0000 mg | INTRAMUSCULAR | Status: DC | PRN
  Administered 2015-07-24: 1 mg via INTRAVENOUS
  Filled 2015-07-24: qty 1

## 2015-07-24 MED ORDER — DIPHENHYDRAMINE HCL 50 MG/ML IJ SOLN: 12.5000 mg | Freq: Four times a day (QID) | INTRAMUSCULAR | Status: DC | PRN

## 2015-07-24 MED ORDER — DIPHENHYDRAMINE HCL 12.5 MG/5ML PO ELIX
12.5000 mg | ORAL_SOLUTION | Freq: Four times a day (QID) | ORAL | Status: DC | PRN
  Filled 2015-07-24: qty 5

## 2015-07-24 MED ORDER — CALCIUM CARBONATE ANTACID 500 MG PO CHEW
2.0000 | CHEWABLE_TABLET | ORAL | Status: DC | PRN
  Administered 2015-07-29 – 2015-08-01 (×2): 400 mg via ORAL
  Filled 2015-07-24 (×2): qty 2

## 2015-07-24 MED ORDER — ZOLPIDEM TARTRATE 5 MG PO TABS: 5.0000 mg | ORAL_TABLET | Freq: Every evening | ORAL | Status: DC | PRN

## 2015-07-24 MED ORDER — HYDROMORPHONE HCL 1 MG/ML IJ SOLN: 1.0000 mg | INTRAMUSCULAR | Status: DC | PRN

## 2015-07-24 MED ORDER — HYDROMORPHONE HCL 1 MG/ML IJ SOLN
1.0000 mg | INTRAMUSCULAR | Status: DC | PRN
  Administered 2015-07-24 – 2015-07-25 (×10): 1 mg via INTRAVENOUS
  Filled 2015-07-24 (×10): qty 1

## 2015-07-24 MED ORDER — ONDANSETRON HCL 4 MG/2ML IJ SOLN
4.0000 mg | Freq: Four times a day (QID) | INTRAMUSCULAR | Status: DC | PRN
Start: 2015-07-24 — End: 2015-07-25

## 2015-07-24 MED ORDER — DIPHENHYDRAMINE HCL 25 MG PO CAPS
25.0000 mg | ORAL_CAPSULE | Freq: Four times a day (QID) | ORAL | Status: DC | PRN
  Administered 2015-07-24: 25 mg via ORAL
  Filled 2015-07-24: qty 1

## 2015-07-24 MED ORDER — HYDROMORPHONE HCL 1 MG/ML IJ SOLN
1.0000 mg | Freq: Once | INTRAMUSCULAR | Status: AC
Start: 2015-07-24 — End: 2015-07-24
  Administered 2015-07-24: 1 mg via INTRAVENOUS
  Filled 2015-07-24: qty 1

## 2015-07-24 MED ORDER — FOLIC ACID 1 MG PO TABS
2.0000 mg | ORAL_TABLET | Freq: Every day | ORAL | Status: DC
Start: 2015-07-24 — End: 2015-08-02
  Administered 2015-07-24 – 2015-08-02 (×10): 2 mg via ORAL
  Filled 2015-07-24 (×11): qty 2

## 2015-07-24 MED ORDER — DOCUSATE SODIUM 100 MG PO CAPS: 100.0000 mg | ORAL_CAPSULE | Freq: Every day | ORAL | Status: DC

## 2015-07-24 MED ORDER — ENOXAPARIN SODIUM 40 MG/0.4ML ~~LOC~~ SOLN
40.0000 mg | SUBCUTANEOUS | Status: DC
  Administered 2015-07-24 – 2015-07-29 (×3): 40 mg via SUBCUTANEOUS
  Filled 2015-07-24 (×8): qty 0.4

## 2015-07-24 MED ORDER — PRENATAL MULTIVITAMIN CH
1.0000 | ORAL_TABLET | Freq: Every day | ORAL | Status: DC
  Administered 2015-07-24 – 2015-08-02 (×10): 1 via ORAL
  Filled 2015-07-24 (×10): qty 1

## 2015-07-24 MED ORDER — HYDROMORPHONE 1 MG/ML IV SOLN
INTRAVENOUS | Status: DC
  Administered 2015-07-24: 3.6 mg via INTRAVENOUS
  Administered 2015-07-24: 1.2 mg via INTRAVENOUS
  Administered 2015-07-25 (×2): 0.6 mg via INTRAVENOUS
  Administered 2015-07-25: 1.8 mg via INTRAVENOUS
  Filled 2015-07-24: qty 25

## 2015-07-24 MED ORDER — NALOXONE HCL 0.4 MG/ML IJ SOLN: 0.4000 mg | INTRAMUSCULAR | Status: DC | PRN

## 2015-07-24 MED ORDER — SODIUM CHLORIDE 0.9% FLUSH: 9.0000 mL | INTRAVENOUS | Status: DC | PRN

## 2015-07-24 MED ORDER — LOPERAMIDE HCL 2 MG PO CAPS
4.0000 mg | ORAL_CAPSULE | Freq: Once | ORAL | Status: AC
  Administered 2015-07-24: 4 mg via ORAL
  Filled 2015-07-24: qty 2

## 2015-07-24 MED ORDER — ACETAMINOPHEN 325 MG PO TABS: 650.0000 mg | ORAL_TABLET | ORAL | Status: DC | PRN

## 2015-07-24 MED ORDER — DOCUSATE SODIUM 100 MG PO CAPS: 100.0000 mg | ORAL_CAPSULE | Freq: Every day | ORAL | Status: DC | PRN

## 2015-07-24 NOTE — Consult Note (Signed)
[facility] Medical Consultation  [name] [id number] DOB: 1990/09/01 DOA: 07/23/2015 PCP: Angelica Chessman, MD   Requesting physician: Truett Mainland, MD Date of consultation: 07/24/2015 Reason for consultation: Sickle Cell Crisis  Impression/Recommendations Active Problems:   Sickle cell pain crisis ([place])   Cocaine use:     HSV-2 seropositive:      Anemia of Chronic Disease:     1. Sickle Cell Pain Crisis:  I agree with IVF, PCA and Clinician assisted doses of Dilaudid. Will reassess pain tomorrow.  2. Cocaine Use: Counseled against further use. 3. HSV Sero-Positive: Discussed with Dr. Nehemiah Settle and will defer to Ob-Gyn with regard to suppressive therapy in context of pregnancy. 4. Leukocytosis: Likely secondary to crisis. No evidence of infection. 5. Hypokalemia: Replace orally. 6. Anemia of Chronic Disease: Pt at a Hb of almost 8 g/dl. Will continue monitor.    I will followup again tomorrow. Please contact me if I can be of assistance in the meanwhile. Thank you for this consultation.  Chief Complaint:  Pain in back and legs x 4 days.  HPI:  This is an opiate tolerant patient with Hb SS who is also [redacted] weeks GA presents with pain characteristic of sickle cell crisis. She states that she tried to manage her pain with oral medications and was unsuccessful. She was found to have cocaine in her urine and admits to one time experimental use. She denies any fevers, chills, Nausea or diarrhea. I am asked to see for management of Sickle Cell Crisis.  Review of Systems:  A comprehensive review of systems was completed and all systems are negative except as noted in the HPI.   Past Medical History  Diagnosis Date  . Miscarriage 03/22/2011    Pt reports 2 miscarriages.  . Depression 01/06/2011  . GERD (gastroesophageal reflux disease) 02/17/2011  . Trichotillomania     h/o  . Blood transfusion     "lots"  . Sickle cell anemia with crisis ([place])   . Exertional  dyspnea     "sometimes"  . Migraines 11/08/11    "@ least twice/month"  . Chronic back pain     "very severe; have knot in my back; from tight muscle; take RX and exercise for it"  . Mood swings ([place]) 11/08/11    "I go back and forth; real bad"  . Sickle cell anemia ([name])   . Blood transfusion without reported diagnosis    Past Surgical History  Procedure Laterality Date  . Cholecystectomy  05/2010  . Dilation and curettage of uterus  02/20/11    S/P miscarriage   Social History:  reports that she quit smoking about 2 years ago. Her smoking use included Cigarettes. She has a .25 pack-year smoking history. She has never used smokeless tobacco. She reports that she does not drink alcohol or use illicit drugs.  Allergies  Allergen Reactions  . Carrot [Daucus Carota] Hives, Swelling and Rash    Dietary:  Please do not send any form of carrot to patient  . Carrot Oil Hives and Swelling  . Latex Rash    *powdered Latex*    Family History  Problem Relation Age of Onset  . Diabetes Maternal Grandmother   . Diabetes Paternal Grandmother   . Hypertension Paternal Grandmother   . Diabetes Maternal Grandfather   . Sickle cell trait Mother   . Sickle cell trait Father     Prior to Admission medications   Medication Sig Start Date End Date Taking? Authorizing  Provider  methadone (DOLOPHINE) 5 MG tablet Take 1 tablet (5 mg total) by mouth at bedtime. 07/03/15  Yes Olugbemiga Essie Christine, MD  oxyCODONE (ROXICODONE) 15 MG immediate release tablet Take 1 tablet (15 mg total) by mouth every 4 (four) hours as needed for pain. 07/15/15  Yes Dorena Dew, FNP  Prenatal Vit-Min-FA-Fish Oil ([facility] PRENATAL GUMMY) 0.4-113.5 MG CHEW Chew 2-3 each by mouth daily.   Yes Historical Provider, MD   Physical Exam: Blood pressure 102/61, pulse 93, temperature 98.4 F (36.9 C), temperature source Oral, resp. rate 20, height 5\' 1"  (1.549 m), weight 175 lb (79.379 kg), last menstrual period 02/09/2015, SpO2 95 %,  not currently breastfeeding. Filed Vitals:   07/23/15 2302 07/23/15 2337 07/24/15 0431 07/24/15 0433  BP: 123/65 108/69  102/61  Pulse: 113 105 99 93  Temp: 98.8 F (37.1 C)   98.4 F (36.9 C)  TempSrc: Oral   Oral  Resp: 20 20    Height:    5\' 1"  (1.549 m)  Weight:    175 lb (79.379 kg)  SpO2: 97%  95%    General: Alert, awake, oriented x3, in mild distress.  HEENT: Ester/AT PEERL, EOMI, anicteric Neck: Trachea midline, no masses, no thyromegal,y no JVD, no carotid bruit OROPHARYNX: Moist, No exudate/ erythema/lesions.  Heart: Regular rate and rhythm, without murmurs, rubs, gallops or S3. PMI non-displaced. Exam reveals no decreased pulses. Pulmonary/Chest: Normal effort. Breath sounds normal. No. Apnea. Clear to auscultation,no stridor,  no wheezing and no rhonchi noted. No respiratory distress and no tenderness noted. Abdomen: Gravid Neuro: Alert and oriented to person, place and time. Normal motor skills, Displays no atrophy or tremors and exhibits normal muscle tone.  No focal neurological deficits noted cranial nerves II through XII grossly intact. No sensory deficit noted. Strength at baseline in bilateral upper and lower extremities. Gait normal. Musculoskeletal: No warm swelling or erythema around joints, no spinal tenderness noted. Psychiatric: Patient alert and oriented x3, good insight and cognition, good recent to remote recall. Mood, memory, affect and judgement normal Skin: Skin is warm and dry. No bruising, no ecchymosis and no rash noted. Pt is not diaphoretic. No erythema. No pallor       Labs on Admission:  Basic Metabolic Panel:  Recent Labs Lab 07/24/15 0600  NA 135  K 3.2*  CL 109  CO2 21*  GLUCOSE 98  BUN <5*  CREATININE 0.43*  CALCIUM 7.9*  MG 1.8   Liver Function Tests:  Recent Labs Lab 07/24/15 0600  AST 24  ALT 28  ALKPHOS 60  BILITOT 2.6*  PROT 6.4*  ALBUMIN 3.0*   No results for input(s): LIPASE, AMYLASE in the last 168 hours. No  results for input(s): AMMONIA in the last 168 hours. CBC:  Recent Labs Lab 07/24/15 0600  WBC 15.8*  NEUTROABS 13.2*  HGB 7.8*  HCT 22.3*  MCV 89.6  PLT 483*   Cardiac Enzymes: No results for input(s): CKTOTAL, CKMB, CKMBINDEX, TROPONINI in the last 168 hours. BNP: Invalid input(s): POCBNP CBG: No results for input(s): GLUCAP in the last 168 hours.  Radiological Exams on Admission: No results found.    Time spent: 30 minutes  Grecia Lynk A. Pager [phone]  If 7PM-7AM, please contact night-coverage [email or web address] Password [email or web address] 07/24/2015, 11:22 AM

## 2015-07-24 NOTE — Progress Notes (Signed)
Spoke with Bill at [facility] who advised that it will be a few hours for transport to [facility].

## 2015-07-24 NOTE — Progress Notes (Signed)
Report called to [place]. Pt to transfer via [facility] to [facility].

## 2015-07-24 NOTE — Clinical SW OB High Risk (Addendum)
Clinical Social Work Antenatal   Clinical Social Worker:  Alphonzo Cruise, [place] Date/Time:  07/24/2015, 12:53 PM Gestational Age on Admission:  25 y.o. Admitting Diagnosis: Sickle Cell pain crisis   Expected Delivery Date:  11/06/15  Family/Home Environment  Home Address: [address], [place], [place] [address]   Household Member/Support Name:  Relationship:  Mother Other Support: Patient reports that the FOB is supportive, however, they are no longer in a relationship.  She states she has a 39 year old sister that can be supportive as well as another 24 year old whom she refers to as a sister, although they are not biologically related.  Overall, patient feels she lacks support.  She states her best friend lives in [place] and she does not get to see her.   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   Medicaid [facility]): [place].  Patient also receives Medicare. School: Patient was enrolled at [employer] in the [place] program, but is no longer in school at this time.  She is motivated to finish school and states she has applied to return to [employer] in the fall of 2017.     Current Grade:   Homebound Arranged:  N/A  Other Resources:  Medicaid, WIC  Cultural/Environment Issues Impacting Care: None stated.  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  Patient states she attends [employer] and that her faith is extremely important to her.   Strengths/Weaknesses/Factors to Consider  Concerns Related to Hospitalization: None stated.   Previous Pregnancies/Feelings Towards Pregnancy?  Concerns related to being/becoming a mother?: Patient reports that she is excited about the baby and that she is having a girl.  She has a 61 month old son at home and speaks positively about being a mother.  Social  Support (FOB? Who is/will be helping with baby/other kids?): Patient's son, Mat Carne, is being cared for by his MGM while patient is in the hospital.  Patient reports that FOB/Raishawn "Messiah" Wall is supportive, but that they are not in a relationship.  He is not the father of her first child, but they were together during that pregnancy.      Recent Stressful Life Events (life changes in past year?): Patient became a mother in July of 2016.  She has experienced numerous Sickle Cell pain crises, which have required hospitalization.     Prenatal Care/Education/Home Preparations: Not discussed at this time.   Domestic Violence (of any type):  No If Yes to Domestic Violence, Describe/Action Plan:    Substance Use During Pregnancy: Yes   Complete PHQ-9 (Depresssion Screening) on all Antenatal Patients PHQ-9 Score:  12   Follow-up Recommendations: CSW recommends outpatient counseling to address Depression.  CSW recommends building healthy relationships and is providing patient with information for area mom's groups, including Health Moms, Healthy Babies through the [place].  CSW suggests involvement in her church, as this is something she identifies as important to her.  CSW made referral to the Pregnancy Care Management program and patient will be assigned to an OB Case Manager.     Patient Advised/Response: Patient states agreement to all above recommendations.  She states that she has not been open to counseling when it has been recommended in the past, and that she is now.  She seems eager to enter treatment.  She states that she attends her church regularly,  and recently went to Wednesday night Bible study after being invited by her pastor, whom she states is new to the church.  She reports that transportation is often a barrier, but that there is a van on Wednesday nights that she can ride.  She states that it is also important to her that she takes her son to church.  She is open to attending  programs such as Healthy Moms, Healthy Babies and seems eager about this opportunity as well.     Clinical Assessment/Plan: CSW is familiar with patient from her last pregnancy/delivery in July of 2016.  CSW asked patient if she felt like talking at this time and patient agreed.  She appears to remember talking with this CSW in the past and presents as receptive and pleasant.  She states she is not feeling well and will be transferred to [facility] when a bed is available.  She reports that she usually gets her care at [facility], but that [facility] when she was in pain crisis because of the pregnancy as well as the wait times at [facility].  Patient informed CSW that her doctor is "disappointed in me."  CSW asked her to elaborate on this.  She reports that she tried cocaine for the first time this week, and, "that is not [place]."  CSW inquired about how patient feels about this and she reports that she "knows it was stupid" and that it was "a one time thing."  She informed CSW that she was feeling down and depressed and called a friend for support.  Her friend invited her over.  Patient states that she knows this friend uses cocaine, but that she has never used in front of her.  She states this time her friend used cocaine with patient present and offered it to her.  Patient reports that she initially said "no" to the offer, but after being asked again, she accepted.  She states her friend told her that she knew she was going through a hard time and felt like this would make her feel better.  Patient reports that she did not think the amount she used would show up on a drug screen, but that she is not trying to hide what she did.  She states she takes ownership of the decision to try the drug and commits to never using it again.  She states she did not like the way it made her feel and that she has no reason to ever try it again.  She added that she feels "people are making a big  deal about it."  CSW explained that cocaine use, especially in pregnancy, is a "big deal," but that it does not have to become a "big deal" to her if she truly makes this a one time occurrence.  Patient states that she needs to change her environment and the people she associates with.  She reports, "I have no friends." CSW explored ways of building healthy relationships and provided patient with information about area programs and opportunities.  MOB was very appreciative and interested.  CSW strongly encourages counseling to address her symptoms of depression and low self esteem.  Patient also states a willingness to enter treatment.  CSW made referral to the Healthy Start Program since this is a home based program, where transportation will not be a barrier to accessing care.  CSW also feels this program will benefit her as a parent.  CSW explained that after  3-6 months of in-home counseling, she may transition to office based counseling if she wishes. CSW inquired about transportation resources patient utilizes.  She states she uses Medicaid transportation for medical appointments.  CSW asked if she has ever applied for [employer] and she states she recently completed the application and is waiting to hear.  She states plans to get her own car.  CSW discussed stressors of car ownership, as patient also mentions financial hardship at times.  Patient feels she can afford a car and the associated fees with her tax return and states that having her own car will be a stress reliever.   Patient seemed very appreciative of the opportunity to talk with CSW and states this was positive for her.  CSW provided contact information and explained ongoing support services offered for the rest of her stay at [facility] hospital.

## 2015-07-24 NOTE — Progress Notes (Signed)
Spoke with Eddie Dibbles at [facility] for report.

## 2015-07-24 NOTE — MAU Note (Signed)
Phlebotomist unable to obtain blood for labs

## 2015-07-24 NOTE — Progress Notes (Signed)
[facility] left with patient transferring to [facility]. Pt on PCA pump. [facility] using our pump during transport.

## 2015-07-24 NOTE — Progress Notes (Signed)
[facility] arrived for pt  

## 2015-07-24 NOTE — Progress Notes (Signed)
Report called and transfer requested.  Spoke with Alvester Chou at [facility].

## 2015-07-24 NOTE — H&P (Signed)
FACULTY PRACTICE ANTEPARTUM ADMISSION HISTORY AND PHYSICAL NOTE   History of Present Illness: [name] is a 25 yo G3P1011 at [redacted]w[redacted]d with a PMH of sickle cell disease, chronic back pain, migraine headache, miscarriage, GERD, depression, who presents with 1 day hx of abdominal pain, leg pain, nausea, vomiting, and diarrhea.   Patient states that her vomiting and diarrhea started around noon today. She has had non-bloody non-bilious emesis multiple times. Stool is watery, no hematochezia. Admits to fecal incontinence with diarrhea. Abdominal pain and leg pain started after the episodes of emesis and diarrhea. She states this feels like she is having a pain crisis. Abdominal pain is located in the center of the abdomen and does not radiate. Also has pain in bilateral legs that started around the time the abdominal pain started. Denies fever.   Pregnancy thus far, per patient, has been uncomplicated. No contractions, vaginal bleeding, or loss of fluids. + FM. States she has not had any pain medication today. Was recently discharged from [facility] on 2/28 for sickle cell pain crisis.   MAU course:  0000: Patient having emesis and diarrhea in MAU. IVF started as patient appears dehydrated. If pt was euvolemic would start 1/2 NS instead. Labs ordered: CBC, retic, CMP, GI path panel, FOBT, and UDS. IV Dilaudid 1 mg q4 PRN for pain and PO Benadryl ordered for itching. No shortness of breath and lung exam WNL, no concern for acute chest syndrome at this time. FHT reactive.   0040: UDS positive for Opiates (takes prescribed opiates at home) and Cocaine. Pt admits she did "3 snorts" of cocaine last night for pain. States that was her first time trying cocaine.   0145: Pt resting comfortably in bed prior to examination. On physical exam: tenderness to light palpation of all 4 quadrants and tenderness to light palpation of right knee.  0245: Pt still complaining of pain. Per RN after 10 minutes of 1st  dose of Dilaudid, requested more pain medication. When pt told she is not scheduled for another dose until 4 hours, she asked to speak to physician. Pt became upset and said that is not what happens at the "other hospital" and she usually gets "pain medicine every 30 minutes" and that is what she would like here.  0300: I went to patient's bedside and explained it would be poor care to give her more opiod pain medication at this time. Explained side effects of higher doses of pain medication to patient and reasons why it is not indicated at this time. Patient not satisfied and would like to speak to Education officer, museum. Discussed with patient if she is still in pain after 1 hour, will give her another dose of Dilaudid. If she is still in pain after that, will need to admit to the hospital. Still unable to see labs as lab tech was not able to draw blood. Discussed with Nigel Berthold, CNM.   0330: Patient still in pain after Dilaudid 1 mg x 2. She feels like her pain will not be controlled if she goes home. No labs yet. Patient appears comfortable at this time but states pain is 8/10 in abdomen and legs.   Patient Active Problem List   Diagnosis Date Noted  . Cocaine use 07/24/2015  . 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 Sickle cell anemia with pain ([place]) 07/24/2015  . Maternal sickle cell anemia affecting pregnancy, antepartum ([place]) 07/14/2015  . Sickle cell pain crisis ([place]) 07/12/2015  . Sickle cell crisis ([place]) 07/12/2015  .  Second trimester pregnancy 07/07/2015  . Hb-SS disease with crisis ([place]) 06/28/2015  . Anemia of chronic disease   . Leukocytosis   . Mood swings ([place]) 11/08/2011  . Migraines 11/08/2011  . GERD (gastroesophageal reflux disease) 02/17/2011  . Depression 01/06/2011  . Sickle cell disease ([place]) 01/08/2009  . Trichotillomania 01/08/2009    Past Medical History  Diagnosis Date  . Miscarriage 03/22/2011    Pt reports 2 miscarriages.  . Depression 01/06/2011  . GERD  (gastroesophageal reflux disease) 02/17/2011  . Trichotillomania     h/o  . Blood transfusion     "lots"  . Sickle cell anemia with crisis ([place])   . Exertional dyspnea     "sometimes"  . Migraines 11/08/11    "@ least twice/month"  . Chronic back pain     "very severe; have knot in my back; from tight muscle; take RX and exercise for it"  . Mood swings ([place]) 11/08/11    "I go back and forth; real bad"  . Sickle cell anemia ([name])   . Blood transfusion without reported diagnosis     Past Surgical History  Procedure Laterality Date  . Cholecystectomy  05/2010  . Dilation and curettage of uterus  02/20/11    S/P miscarriage    OB History  Gravida Para Term Preterm AB SAB TAB Ectopic Multiple Living  3 1 1  1 1    0 1    # Outcome Date GA Lbr Len/2nd Weight Sex Delivery Anes PTL Lv  3 Current           2 Term 12/07/14 [redacted]w[redacted]d / 00:08 2.384 kg (5 lb 4.1 oz) M Vag-Spont EPI  Y  1 SAB 02/20/11             Comments: System Generated. Please review and update pregnancy details.    Obstetric Comments  Miscarried in October 2012 at about 7 weeks    Social History   Social History  . Marital Status: Single    Spouse Name: N/A  . Number of Children: N/A  . Years of Education: N/A   Social History Main Topics  . Smoking status: Former Smoker -- 0.25 packs/day for 1 years    Types: Cigarettes    Quit date: 03/25/2013  . Smokeless tobacco: Never Used  . Alcohol Use: No     Comment: pt states she quit marijuan in May 2013. Rare ETOH, + cigarettes.  She is enrolled in school  . Drug Use: No  . Sexual Activity: Yes    Birth Control/ Protection: None     Comment: last sex Jul 11 2015   Other Topics Concern  . 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 Diabetes Maternal Grandmother   . Diabetes Paternal Grandmother   .  Hypertension Paternal Grandmother   . Diabetes Maternal Grandfather   . Sickle cell trait Mother   . Sickle cell trait Father     Allergies  Allergen Reactions  . Carrot [Daucus Carota] Hives, Swelling and Rash    Dietary:  Please do not send any form of carrot to patient  . Carrot Oil Hives and Swelling  . Latex Rash    *powdered Latex*     Prescriptions prior to admission  Medication Sig Dispense Refill Last Dose  .  folic acid (FOLVITE) 1 MG tablet Take 2 tablets (2 mg total) by mouth daily. 60 tablet 2 07/23/2015 at Unknown time  . methadone (DOLOPHINE) 5 MG tablet Take 1 tablet (5 mg total) by mouth at bedtime. 30 tablet 0 07/22/2015 at Unknown time  . oxyCODONE (ROXICODONE) 15 MG immediate release tablet Take 1 tablet (15 mg total) by mouth every 4 (four) hours as needed for pain. 90 tablet 0 07/23/2015 at 1000  . Prenatal Vit-Min-FA-Fish Oil ([employer] PRENATAL GUMMY) 0.4-113.5 MG CHEW Chew 2-3 each by mouth daily.   07/23/2015 at Unknown time    Review of Systems - General ROS: negative for - chills, fatigue or fever Psychological ROS: negative for - anxiety or depression Hematological and Lymphatic ROS: positive for - sickle cell anemia Respiratory ROS: no cough, shortness of breath, or wheezing Cardiovascular ROS: no chest pain or dyspnea on exertion Gastrointestinal ROS: positive for - abdominal pain, diarrhea and nausea/vomiting Genito-Urinary ROS: no dysuria, trouble voiding, or hematuria Musculoskeletal ROS: positive for - joint pain and pain in leg - bilateral, knees Neurological ROS: no TIA or stroke symptoms Dermatological ROS: negative for dry skin and rash  Vitals:  BP 108/69 mmHg  Pulse 105  Temp(Src) 98.8 F (37.1 C) (Oral)  Resp 20  SpO2 97%  LMP 02/09/2015 Physical Examination: CONSTITUTIONAL: Well-developed, well-nourished female in no acute distress.  HENT:  Normocephalic, atraumatic, External right and left ear normal. Oropharynx is clear with tachy mucous  membranes EYES: Conjunctivae and EOM are normal. Pupils are equal, round, and reactive to light. No scleral icterus.  NECK: Normal range of motion, supple, no masses SKIN: Skin is warm and dry. No rash noted. Not diaphoretic. No erythema. No pallor. [place]: Alert and oriented to person, place, and time. Normal reflexes, muscle tone coordination. No cranial nerve deficit noted. PSYCHIATRIC: Normal mood and affect. Normal behavior. Normal judgment and thought content. CARDIOVASCULAR: Tachycardic, regular rhythm, 2+ distal pulses. RESPIRATORY: Effort and breath sounds normal, no problems with respiration noted ABDOMEN: Normal bowel sounds, tender to palpation in all 4 quadrants, distended;gravid. MUSCULOSKELETAL: Normal range of motion. No edema. Tenderness to palpation of right knee,   Cervix: Not evaluated. Fetal Monitoring:Baseline: 145 bpm, Variability: Good {> 6 bpm), Accelerations: Reactive and Decelerations: Absent Tocometer: No contractions  Labs:  Results for orders placed or performed during the hospital encounter of 07/23/15 (from the past 24 hour(s))  Urine rapid drug screen (hosp performed)   Collection Time: 07/23/15 11:05 PM  Result Value Ref Range   Opiates POSITIVE (A) NONE DETECTED   Cocaine POSITIVE (A) NONE DETECTED   Benzodiazepines NONE DETECTED NONE DETECTED   Amphetamines NONE DETECTED NONE DETECTED   Tetrahydrocannabinol NONE DETECTED NONE DETECTED   Barbiturates NONE DETECTED NONE DETECTED    Imaging Studies: Ir Fluoro Guide Cv Line Left  06/30/2015  INDICATION: 25 year old with sickle cell crisis and needs central venous access. The patient is pregnant. Patient's abdomen was shielded for this procedure. EXAM: PICC LINE PLACEMENT WITH ULTRASOUND AND FLUOROSCOPIC GUIDANCE MEDICATIONS: None ANESTHESIA/SEDATION: None FLUOROSCOPY TIME:  Fluoroscopy Time: 6 seconds.  0.6 mGy COMPLICATIONS: None immediate. PROCEDURE: The patient was advised of the possible risks and  complications and agreed to undergo the procedure. In particular, the risks of radiation exposure and pregnancy were discussed the patient. The patient was then brought to the angiographic suite for the procedure. The right arm was prepped with chlorhexidine, draped in the usual sterile fashion using maximum barrier technique (cap and mask, sterile gown, sterile gloves,  large sterile sheet, hand hygiene and cutaneous antiseptic). Local anesthesia was attained by infiltration with 1% lidocaine. Complex venous anatomy in the right upper arm. Patient may have an early radial artery takeoff. Attempted to cannulate multiple veins in the right upper arm with ultrasound guidance but these attempts were unsuccessful. Therefore, attention was directed to the left arm. Left arm was prepped and draped in sterile fashion. Ultrasound demonstrated patency of the left brachial vein, and this was documented with an image. Under real-time ultrasound guidance, this vein was accessed with a 21 gauge micropuncture needle and image documentation was performed. The needle was exchanged over a guidewire for a peel-away sheath through which a 43 cm 5 [place] dual lumen power injectable PICC was advanced, and positioned with its tip at the lower SVC/right atrial junction. Fluoroscopy during the procedure and fluoro spot radiograph confirms appropriate catheter position. The catheter was flushed, secured to the skin, and covered with a sterile dressing. IMPRESSION: Successful placement of a left arm PICC with sonographic and fluoroscopic guidance. The catheter is ready for use. Electronically Signed   By: Markus Daft M.D.   On: 06/30/2015 17:27   Ir US Guide Vasc Access Left  06/30/2015  INDICATION: 26 year old with sickle cell crisis and needs central venous access. The patient is pregnant. Patient's abdomen was shielded for this procedure. EXAM: PICC LINE PLACEMENT WITH ULTRASOUND AND FLUOROSCOPIC GUIDANCE MEDICATIONS: None  ANESTHESIA/SEDATION: None FLUOROSCOPY TIME:  Fluoroscopy Time: 6 seconds.  0.6 mGy COMPLICATIONS: None immediate. PROCEDURE: The patient was advised of the possible risks and complications and agreed to undergo the procedure. In particular, the risks of radiation exposure and pregnancy were discussed the patient. The patient was then brought to the angiographic suite for the procedure. The right arm was prepped with chlorhexidine, draped in the usual sterile fashion using maximum barrier technique (cap and mask, sterile gown, sterile gloves, large sterile sheet, hand hygiene and cutaneous antiseptic). Local anesthesia was attained by infiltration with 1% lidocaine. Complex venous anatomy in the right upper arm. Patient may have an early radial artery takeoff. Attempted to cannulate multiple veins in the right upper arm with ultrasound guidance but these attempts were unsuccessful. Therefore, attention was directed to the left arm. Left arm was prepped and draped in sterile fashion. Ultrasound demonstrated patency of the left brachial vein, and this was documented with an image. Under real-time ultrasound guidance, this vein was accessed with a 21 gauge micropuncture needle and image documentation was performed. The needle was exchanged over a guidewire for a peel-away sheath through which a 43 cm 5 [place] dual lumen power injectable PICC was advanced, and positioned with its tip at the lower SVC/right atrial junction. Fluoroscopy during the procedure and fluoro spot radiograph confirms appropriate catheter position. The catheter was flushed, secured to the skin, and covered with a sterile dressing. IMPRESSION: Successful placement of a left arm PICC with sonographic and fluoroscopic guidance. The catheter is ready for use. Electronically Signed   By: Markus Daft M.D.   On: 06/30/2015 17:27   [place] Mfm Ob Detail +14 Wk  07/15/2015  OBSTETRICAL ULTRASOUND: This exam was performed within a [facility]  [facility]. The OB US report was generated in the AS system, and faxed to the ordering physician.  This report is available in the [facility]. See the AS Obstetric US report via the Image Link.   Assessment and Plan: [name] is a 25 yo G3P1011 at [redacted]w[redacted]d with a PMH of sickle cell disease,  chronic back pain, migraine headache, miscarriage, GERD, depression, who presents with 1 day hx of abdominal pain, leg pain nausea, vomiting, and diarrhea.    Sickle Cell disease/sickle cell pain crisis: Persistent right knee pain and abdominal pain present after IV Dilaudid 1 mg x 2. No obvious joint swelling on physical exam but joint tenderness present in right knee. Abdominal exam still revealing diffuse tenderness to palpation. Afebrile with stable vital signs, slightly tachycardic likely from dehydration. - Admit to ante, attending Dr. Gala Romney - Vital signs per floor protocol - Hold home pain medication regimen (Methadone 5 mg daily and Roxicodone 15 mg q4 PRN) - Dilaudid 1 mg q 1 hr for pain for now. Will consult team for possible PCA protocol.  - PO Benadryl for itching. Avoid IV Benadryl with IV Dilaudid.  - Maintenance IVF NS @ 125cc/hr for now while rehydrating from emesis and diarrhea. When euvolemic, switch to D5 1/2 NS @ 100cc/hr.  - Need basic labs still when able to draw: CBC, retic, CMP. If still cannot get labs, do fingerstick hemoglobin. - Type and screen - Consider blood transfusion if hemoglobin < 7  - Folic Acid - STAT CXR for any signs of respiratory distress - Contact internal medicine/hematology for further recommendations  Nausea/Vomiting/Diarrhea/Abdominal Pain: Likely viral gastroenteritis. Abdominal pain unresolved with IV Dilaudid 1 mg x 2. Emesis resolved with Zofran, diarrhea resolved with Imodium. Other differentials include: vaso-occlusive process in abdomen, nausea and vomiting of pregnancy, food poisoning, symptomatic cholelithiasis, cholecystitis, appendicitis.   -  Fluids as above - PRN Zofran and Imodium - Clear liquid diet - Enteric precautions - Labs: C. Diff, Stool cx, FOBT, CBC, CMP  2nd Trimester Pregnancy: Upon chart review, patient has missed all her OB appointments due to hospitalizations. Pregnancy has been managed in inpatient setting. Pre-natal panel ordered on 2/28. FHM reactive in MAU.  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 No current active lesions per patient report - Consider suppressive therapy   Positive Cocaine on UDS: Patient admits she used cocaine last night for the 1st time, took "3 sniffs".  - Counseled patient on harmful effects of cocaine on her health and the health of the baby and encouraged her to never use again - Avoid beta blocker usage - Social work consulted   Carlyle Dolly, MD

## 2015-07-24 NOTE — MAU Note (Signed)
Pt requests pain medication more often than ordered.  MD informed of pt dissatisfaction.  House coverage informed of pt concerns and spoke with patient. Pt satisfied with hourly pain meds.

## 2015-07-25 LAB — GASTROINTESTINAL PANEL BY PCR, STOOL (REPLACES STOOL CULTURE)
ADENOVIRUS F40/41: NOT DETECTED
ASTROVIRUS: NOT DETECTED
CAMPYLOBACTER SPECIES: NOT DETECTED
Cryptosporidium: NOT DETECTED
Cyclospora cayetanensis: NOT DETECTED
E. coli O157: NOT DETECTED
ENTAMOEBA HISTOLYTICA: NOT DETECTED
ENTEROTOXIGENIC E COLI (ETEC): NOT DETECTED
Enteroaggregative E coli (EAEC): NOT DETECTED
Enteropathogenic E coli (EPEC): NOT DETECTED
GIARDIA LAMBLIA: NOT DETECTED
NOROVIRUS GI/GII: DETECTED — AB
PLESIMONAS SHIGELLOIDES: NOT DETECTED
Rotavirus A: NOT DETECTED
Salmonella species: NOT DETECTED
Sapovirus (I, II, IV, and V): NOT DETECTED
Shiga like toxin producing E coli (STEC): NOT DETECTED
Shigella/Enteroinvasive E coli (EIEC): NOT DETECTED
VIBRIO CHOLERAE: NOT DETECTED
Vibrio species: NOT DETECTED
Yersinia enterocolitica: NOT DETECTED

## 2015-07-25 LAB — BASIC METABOLIC PANEL
ANION GAP: 10 (ref 5–15)
BUN: <5 mg/dL — ABNORMAL LOW (ref 6–20)
CALCIUM: 8.4 mg/dL — AB (ref 8.9–10.3)
CO2: 19 mmol/L — AB (ref 22–32)
CREATININE: 0.37 mg/dL — AB (ref 0.44–1.00)
Chloride: 109 mmol/L (ref 101–111)
GLUCOSE: 90 mg/dL (ref 65–99)
POTASSIUM: 3.8 mmol/L (ref 3.5–5.1)
Sodium: 138 mmol/L (ref 135–145)

## 2015-07-25 MED ORDER — POLYETHYLENE GLYCOL 3350 17 G PO PACK
17.0000 g | PACK | Freq: Every day | ORAL | Status: DC
  Filled 2015-07-25: qty 1

## 2015-07-25 MED ORDER — SENNOSIDES-DOCUSATE SODIUM 8.6-50 MG PO TABS
1.0000 | ORAL_TABLET | Freq: Two times a day (BID) | ORAL | Status: DC
  Administered 2015-07-25 – 2015-08-02 (×16): 1 via ORAL
  Filled 2015-07-25 (×17): qty 1

## 2015-07-25 MED ORDER — DIPHENHYDRAMINE HCL 25 MG PO CAPS
25.0000 mg | ORAL_CAPSULE | Freq: Four times a day (QID) | ORAL | Status: DC | PRN
  Administered 2015-07-25 – 2015-08-02 (×7): 50 mg via ORAL
  Filled 2015-07-25 (×7): qty 2

## 2015-07-25 MED ORDER — POLYETHYLENE GLYCOL 3350 17 G PO PACK: 17.0000 g | PACK | Freq: Every day | ORAL | Status: DC | PRN

## 2015-07-25 MED ORDER — HYDROMORPHONE 1 MG/ML IV SOLN
INTRAVENOUS | Status: DC
  Administered 2015-07-25: 7.9 mg via INTRAVENOUS
  Administered 2015-07-25: 17:00:00 via INTRAVENOUS
  Administered 2015-07-26: 1.5 mg via INTRAVENOUS
  Administered 2015-07-26: 16:00:00 via INTRAVENOUS
  Administered 2015-07-26: 2.5 mg via INTRAVENOUS
  Administered 2015-07-26: 7.5 mg via INTRAVENOUS
  Administered 2015-07-26: 10 mg via INTRAVENOUS
  Administered 2015-07-27 (×2): 4.5 mg via INTRAVENOUS
  Administered 2015-07-27: 16:00:00 via INTRAVENOUS
  Administered 2015-07-27: 7 mg via INTRAVENOUS
  Administered 2015-07-27: 4 mg via INTRAVENOUS
  Administered 2015-07-27: 4.5 mg via INTRAVENOUS
  Administered 2015-07-27: 2 mg via INTRAVENOUS
  Administered 2015-07-27: 1 mg via INTRAVENOUS
  Administered 2015-07-28 (×2): 2 mg via INTRAVENOUS
  Filled 2015-07-25 (×3): qty 25

## 2015-07-25 MED ORDER — SODIUM CHLORIDE 0.9% FLUSH: 9.0000 mL | INTRAVENOUS | Status: DC | PRN

## 2015-07-25 MED ORDER — POTASSIUM CHLORIDE CRYS ER 20 MEQ PO TBCR
40.0000 meq | EXTENDED_RELEASE_TABLET | Freq: Two times a day (BID) | ORAL | Status: DC
  Administered 2015-07-25 – 2015-07-26 (×3): 40 meq via ORAL
  Filled 2015-07-25 (×3): qty 2

## 2015-07-25 MED ORDER — ONDANSETRON HCL 4 MG/2ML IJ SOLN
4.0000 mg | Freq: Four times a day (QID) | INTRAMUSCULAR | Status: DC | PRN
Start: 2015-07-25 — End: 2015-08-01

## 2015-07-25 MED ORDER — DEXTROSE-NACL 5-0.45 % IV SOLN
INTRAVENOUS | Status: DC
  Administered 2015-07-25 – 2015-08-01 (×17): via INTRAVENOUS

## 2015-07-25 MED ORDER — NALOXONE HCL 0.4 MG/ML IJ SOLN: 0.4000 mg | INTRAMUSCULAR | Status: DC | PRN

## 2015-07-25 MED ORDER — HYDROMORPHONE HCL 2 MG/ML IJ SOLN
2.0000 mg | INTRAMUSCULAR | Status: DC | PRN
  Administered 2015-07-25 – 2015-08-01 (×65): 2 mg via INTRAVENOUS
  Filled 2015-07-25 (×65): qty 1

## 2015-07-25 NOTE — Progress Notes (Signed)
Pt. Positive for Noro Virus. Remains on Enteric Precautions. MD notified, no new orders received.

## 2015-07-25 NOTE — Progress Notes (Signed)
Fetal Heart Rate=138 taken by Manuela Schwartz ED Charge RN

## 2015-07-25 NOTE — Progress Notes (Signed)
Pt observed eating chinese food that she ordered earlier in the shift.  Pt was informed of her clear liquid diet order and chose to order this meal.  Pt has not complained of any nausea, vomiting, or diarrhea post meal.

## 2015-07-25 NOTE — Progress Notes (Signed)
SICKLE CELL SERVICE PROGRESS NOTE  [name] [id number] DOB: 1990-09-09 DOA: 07/23/2015 PCP: Angelica Chessman, MD  Assessment/Plan: Active Problems:   Sickle cell pain crisis ([place])   Cocaine use   HSV-2 seropositive   Sickle cell anemia with pain ([place])  1. Hb SS with crisis: Pt reports that she still has pain mostly in RLE. She rates pain as 8/10. She has used 12.3 mg with 43/41: demands/deliveries in the last 24 hours. I have adjusted PCA to allow for accomodation of bolus dose ever hours. Also increased clinician assisted doses to 2 mg every  2. 2nd Trimester Pregnancy: Evaluated by Ob-rapid response team. 3. Anemia of Chronic Disease: Will check Hb tomorrow. 4. Hypokalemia:  5. Diarrhea: Pt had 2 loose stools yesterday. Stool sent for GI panel. She has had no further diarrhea and no fevers or abdominal pain. I doubt c-diff and have no clinical indicators of infective process.   Code Status: Full Code Family Communication: N/A Disposition Plan: Not yet ready for discharge  [place].  Pager [phone]. If 7PM-7AM, please contact night-coverage.  07/25/2015, 12:01 PM  LOS: 1 day  Interim History: Pt reports that she still has pain mostly in RLE. She rates pain as 8/10. She has used 12.3 mg with 43/41: demands/deliveries in the last 24 hours. She has had no stools in about 24 hours after having 2 episodes of loose stool testerday.  Consultants:  Ob-Gyn  Procedures:  None  Antibiotics:  None   Objective: Filed Vitals:   07/25/15 0200 07/25/15 0540 07/25/15 0600 07/25/15 0903  BP:  101/59    Pulse:  98    Temp:  98.1 F (36.7 C)    TempSrc:  Oral    Resp: 15 14 15 15   Height:      Weight:  172 lb 12.8 oz (78.382 kg)    SpO2: 99% 91% 98% 95%   Weight change: -2 lb 3.2 oz (-0.998 kg)  Intake/Output Summary (Last 24 hours) at 07/25/15 1201 Last data filed at 07/25/15 1124  Gross per 24 hour  Intake    500 ml  Output      0 ml  Net    500 ml     General: Alert, awake, oriented x3, in no acute distress.  HEENT: [place]/AT PEERL, EOMI, anixcteric Neck: Trachea midline,  no masses, no thyromegal,y no JVD, no carotid bruit OROPHARYNX:  Moist, No exudate/ erythema/lesions.  Heart: Regular rate and rhythm, without murmurs, rubs, gallops, PMI non-displaced, no heaves or thrills on palpation.  Lungs: Clear to auscultation, no wheezing or rhonchi noted. No increased vocal fremitus resonant to percussion  Abdomen: Soft, nontender, nondistended, positive bowel sounds, no masses no hepatosplenomegaly noted..  Neuro: No focal neurological deficits noted cranial nerves II through XII grossly intact.  Strength at functional baseline in bilateral upper and lower extremities. Musculoskeletal: No warm swelling or erythema around joints, no spinal tenderness noted. Psychiatric: Patient alert and oriented x3, good insight and cognition, good recent to remote recall.    Data Reviewed: Basic Metabolic Panel:  Recent Labs Lab 07/24/15 0600  NA 135  K 3.2*  CL 109  CO2 21*  GLUCOSE 98  BUN <5*  CREATININE 0.43*  CALCIUM 7.9*  MG 1.8   Liver Function Tests:  Recent Labs Lab 07/24/15 0600  AST 24  ALT 28  ALKPHOS 60  BILITOT 2.6*  PROT 6.4*  ALBUMIN 3.0*   No results for input(s): LIPASE, AMYLASE in the last 168 hours. No  results for input(s): AMMONIA in the last 168 hours. CBC:  Recent Labs Lab 07/24/15 0600  WBC 15.8*  NEUTROABS 13.2*  HGB 7.8*  HCT 22.3*  MCV 89.6  PLT 483*   Cardiac Enzymes: No results for input(s): CKTOTAL, CKMB, CKMBINDEX, TROPONINI in the last 168 hours. BNP (last 3 results) No results for input(s): BNP in the last 8760 hours.  ProBNP (last 3 results) No results for input(s): PROBNP in the last 8760 hours.  CBG: No results for input(s): GLUCAP in the last 168 hours.  No results found for this or any previous visit (from the past 240 hour(s)).   Studies: Ir Fluoro Guide Cv Line  Left  06/30/2015  INDICATION: 25 year old with sickle cell crisis and needs central venous access. The patient is pregnant. Patient's abdomen was shielded for this procedure. EXAM: PICC LINE PLACEMENT WITH ULTRASOUND AND FLUOROSCOPIC GUIDANCE MEDICATIONS: None ANESTHESIA/SEDATION: None FLUOROSCOPY TIME:  Fluoroscopy Time: 6 seconds.  0.6 mGy COMPLICATIONS: None immediate. PROCEDURE: The patient was advised of the possible risks and complications and agreed to undergo the procedure. In particular, the risks of radiation exposure and pregnancy were discussed the patient. The patient was then brought to the angiographic suite for the procedure. The right arm was prepped with chlorhexidine, draped in the usual sterile fashion using maximum barrier technique (cap and mask, sterile gown, sterile gloves, large sterile sheet, hand hygiene and cutaneous antiseptic). Local anesthesia was attained by infiltration with 1% lidocaine. Complex venous anatomy in the right upper arm. Patient may have an early radial artery takeoff. Attempted to cannulate multiple veins in the right upper arm with ultrasound guidance but these attempts were unsuccessful. Therefore, attention was directed to the left arm. Left arm was prepped and draped in sterile fashion. Ultrasound demonstrated patency of the left brachial vein, and this was documented with an image. Under real-time ultrasound guidance, this vein was accessed with a 21 gauge micropuncture needle and image documentation was performed. The needle was exchanged over a guidewire for a peel-away sheath through which a 43 cm 5 [place] dual lumen power injectable PICC was advanced, and positioned with its tip at the lower SVC/right atrial junction. Fluoroscopy during the procedure and fluoro spot radiograph confirms appropriate catheter position. The catheter was flushed, secured to the skin, and covered with a sterile dressing. IMPRESSION: Successful placement of a left arm PICC with  sonographic and fluoroscopic guidance. The catheter is ready for use. Electronically Signed   By: Markus Daft M.D.   On: 06/30/2015 17:27   Ir US Guide Vasc Access Left  06/30/2015  INDICATION: 25 year old with sickle cell crisis and needs central venous access. The patient is pregnant. Patient's abdomen was shielded for this procedure. EXAM: PICC LINE PLACEMENT WITH ULTRASOUND AND FLUOROSCOPIC GUIDANCE MEDICATIONS: None ANESTHESIA/SEDATION: None FLUOROSCOPY TIME:  Fluoroscopy Time: 6 seconds.  0.6 mGy COMPLICATIONS: None immediate. PROCEDURE: The patient was advised of the possible risks and complications and agreed to undergo the procedure. In particular, the risks of radiation exposure and pregnancy were discussed the patient. The patient was then brought to the angiographic suite for the procedure. The right arm was prepped with chlorhexidine, draped in the usual sterile fashion using maximum barrier technique (cap and mask, sterile gown, sterile gloves, large sterile sheet, hand hygiene and cutaneous antiseptic). Local anesthesia was attained by infiltration with 1% lidocaine. Complex venous anatomy in the right upper arm. Patient may have an early radial artery takeoff. Attempted to cannulate multiple veins in the right upper arm  with ultrasound guidance but these attempts were unsuccessful. Therefore, attention was directed to the left arm. Left arm was prepped and draped in sterile fashion. Ultrasound demonstrated patency of the left brachial vein, and this was documented with an image. Under real-time ultrasound guidance, this vein was accessed with a 21 gauge micropuncture needle and image documentation was performed. The needle was exchanged over a guidewire for a peel-away sheath through which a 43 cm 5 [place] dual lumen power injectable PICC was advanced, and positioned with its tip at the lower SVC/right atrial junction. Fluoroscopy during the procedure and fluoro spot radiograph confirms appropriate  catheter position. The catheter was flushed, secured to the skin, and covered with a sterile dressing. IMPRESSION: Successful placement of a left arm PICC with sonographic and fluoroscopic guidance. The catheter is ready for use. Electronically Signed   By: Markus Daft M.D.   On: 06/30/2015 17:27   [place] Mfm Ob Detail +14 Wk  07/15/2015  OBSTETRICAL ULTRASOUND: This exam was performed within a [facility]. The OB US report was generated in the AS system, and faxed to the ordering physician.  This report is available in the [facility]. See the AS Obstetric US report via the Image Link.   Scheduled Meds: . enoxaparin (LOVENOX) injection  40 mg Subcutaneous Q24H  . folic acid  2 mg Oral Daily  . HYDROmorphone   Intravenous 6 times per day  . prenatal multivitamin  1 tablet Oral Q1200  . senna-docusate  1 tablet Oral BID   Continuous Infusions: . dextrose 5 % and 0.45% NaCl 75 mL/hr at 07/25/15 1101    Time spent 30 minutes.

## 2015-07-26 DIAGNOSIS — F1410 Cocaine abuse, uncomplicated: Secondary | ICD-10-CM

## 2015-07-26 DIAGNOSIS — A0811 Acute gastroenteropathy due to Norwalk agent: Secondary | ICD-10-CM

## 2015-07-26 LAB — CBC WITH DIFFERENTIAL/PLATELET
BASOS ABS: 0 10*3/uL (ref 0.0–0.1)
Basophils Relative: 0 %
EOS ABS: 0.3 10*3/uL (ref 0.0–0.7)
Eosinophils Relative: 2 %
HEMATOCRIT: 22.9 % — AB (ref 36.0–46.0)
Hemoglobin: 7.6 g/dL — ABNORMAL LOW (ref 12.0–15.0)
LYMPHS ABS: 2.1 10*3/uL (ref 0.7–4.0)
Lymphocytes Relative: 16 %
MCH: 31.4 pg (ref 26.0–34.0)
MCHC: 33.2 g/dL (ref 30.0–36.0)
MCV: 94.6 fL (ref 78.0–100.0)
Monocytes Absolute: 1.5 10*3/uL — ABNORMAL HIGH (ref 0.1–1.0)
Monocytes Relative: 11 %
NEUTROS ABS: 9.4 10*3/uL — AB (ref 1.7–7.7)
Neutrophils Relative %: 71 %
Platelets: 448 10*3/uL — ABNORMAL HIGH (ref 150–400)
RBC: 2.42 MIL/uL — ABNORMAL LOW (ref 3.87–5.11)
RDW: 16.5 % — AB (ref 11.5–15.5)
WBC: 13.3 10*3/uL — ABNORMAL HIGH (ref 4.0–10.5)

## 2015-07-26 LAB — RETICULOCYTES
RBC.: 2.41 MIL/uL — AB (ref 3.87–5.11)
RETIC CT PCT: 21.3 % — AB (ref 0.4–3.1)
Retic Count, Absolute: 513.3 10*3/uL — ABNORMAL HIGH (ref 19.0–186.0)

## 2015-07-26 MED ORDER — POTASSIUM CHLORIDE 20 MEQ/15ML (10%) PO SOLN
40.0000 meq | Freq: Two times a day (BID) | ORAL | Status: DC
  Administered 2015-07-26: 40 meq via ORAL
  Filled 2015-07-26 (×2): qty 30

## 2015-07-26 NOTE — Progress Notes (Signed)
Patient called this RN to room to say that IV was leaking.  Upon entering room patient had removed IV dressing and catheter was exposed. Patient also used washcloth directly on site to dry it.  IV redressed.  Will continue to monitor.

## 2015-07-26 NOTE — Progress Notes (Signed)
SICKLE CELL SERVICE PROGRESS NOTE  [name] [id number] DOB: 1990/12/08 DOA: 24/01/2016 PCP: Angelica Chessman, MD  Assessment/Plan: Active Problems:   Sickle cell pain crisis ([place])   Cocaine use   HSV-2 seropositive   Sickle cell anemia with pain ([place])  1. Hb SS with crisis: Pt reports that she still has pain mostly in RLE. She rates pain as 8/10. She has used 23.9 mg with 49/47: demands/deliveries in the last 24 hours. Continue PCA PCA and clinician assisted doses at current dosing. 2. Diarrhea/Norovirus: Pt tested positive for Norovirus. Currently no further diarrhea. Continue supportive care 3. 2nd Trimester Pregnancy: Evaluated by Ob-rapid response team. 4. Anemia of Chronic Disease: Hemoglobin stable at 7.6 5. Hypokalemia: Resolved with oral replacement   Code Status: Full Code Family Communication: N/A Disposition Plan: Not yet ready for discharge  [place].  Pager [phone]. If 7PM-7AM, please contact night-coverage.  07/26/2015, 12:40 PM  LOS: 2 days  Interim History: Pt reports that she still has pain mostly in RLE. She rates pain as 8/10.  She has had no stools in about 24 hours after having 2 episodes of loose stool testerday.  Consultants:  Ob-Gyn  Procedures:  None  Antibiotics:  None   Objective: Filed Vitals:   07/26/15 0400 07/26/15 0624 07/26/15 0822 07/26/15 1135  BP:  99/57    Pulse:  79    Temp:  97.4 F (36.3 C)    TempSrc:  Oral    Resp: 22  12 17   Height:      Weight:      SpO2: 95% 96% 96% 97%   Weight change:   Intake/Output Summary (Last 24 hours) at 07/26/15 1240 Last data filed at 07/25/15 1400  Gross per 24 hour  Intake 463.75 ml  Output      0 ml  Net 463.75 ml    General: Alert, awake, oriented x3, in no acute distress.  HEENT: [place]/AT PEERL, EOMI, anixcteric Neck: Trachea midline,  no masses, no thyromegal,y no JVD, no carotid bruit OROPHARYNX:  Moist, No exudate/ erythema/lesions.  Heart: Regular  rate and rhythm, without murmurs, rubs, gallops, PMI non-displaced, no heaves or thrills on palpation.  Lungs: Clear to auscultation, no wheezing or rhonchi noted. No increased vocal fremitus resonant to percussion  Abdomen: Soft, nontender, nondistended, positive bowel sounds, no masses no hepatosplenomegaly noted..  Neuro: No focal neurological deficits noted cranial nerves II through XII grossly intact.  Strength at functional baseline in bilateral upper and lower extremities. Musculoskeletal: No warm swelling or erythema around joints, no spinal tenderness noted. Psychiatric: Patient alert and oriented x3, good insight and cognition, good recent to remote recall.    Data Reviewed: Basic Metabolic Panel:  Recent Labs Lab 07/24/15 0600 07/25/15 1225  NA 135 138  K 3.2* 3.8  CL 109 109  CO2 21* 19*  GLUCOSE 98 90  BUN <5* <5*  CREATININE 0.43* 0.37*  CALCIUM 7.9* 8.4*  MG 1.8  --    Liver Function Tests:  Recent Labs Lab 07/24/15 0600  AST 24  ALT 28  ALKPHOS 60  BILITOT 2.6*  PROT 6.4*  ALBUMIN 3.0*   No results for input(s): LIPASE, AMYLASE in the last 168 hours. No results for input(s): AMMONIA in the last 168 hours. CBC:  Recent Labs Lab 07/24/15 0600 07/26/15 0503  WBC 15.8* 13.3*  NEUTROABS 13.2* 9.4*  HGB 7.8* 7.6*  HCT 22.3* 22.9*  MCV 89.6 94.6  PLT 483* 448*   Cardiac Enzymes: No  results for input(s): CKTOTAL, CKMB, CKMBINDEX, TROPONINI in the last 168 hours. BNP (last 3 results) No results for input(s): BNP in the last 8760 hours.  ProBNP (last 3 results) No results for input(s): PROBNP in the last 8760 hours.  CBG: No results for input(s): GLUCAP in the last 168 hours.  Recent Results (from the past 240 hour(s))  Gastrointestinal Panel by PCR , Stool     Status: Abnormal   Collection Time: 07/24/15  3:00 PM  Result Value Ref Range Status   Campylobacter species NOT DETECTED NOT DETECTED Final   Plesimonas shigelloides NOT DETECTED NOT  DETECTED Final   Salmonella species NOT DETECTED NOT DETECTED Final   Yersinia enterocolitica NOT DETECTED NOT DETECTED Final   Vibrio species NOT DETECTED NOT DETECTED Final   Vibrio cholerae NOT DETECTED NOT DETECTED Final   Enteroaggregative E coli (EAEC) NOT DETECTED NOT DETECTED Final   Enteropathogenic E coli (EPEC) NOT DETECTED NOT DETECTED Final   Enterotoxigenic E coli (ETEC) NOT DETECTED NOT DETECTED Final   Shiga like toxin producing E coli (STEC) NOT DETECTED NOT DETECTED Final   E. coli O157 NOT DETECTED NOT DETECTED Final   Shigella/Enteroinvasive E coli (EIEC) NOT DETECTED NOT DETECTED Final   Cryptosporidium NOT DETECTED NOT DETECTED Final   Cyclospora cayetanensis NOT DETECTED NOT DETECTED Final   Entamoeba histolytica NOT DETECTED NOT DETECTED Final   Giardia lamblia NOT DETECTED NOT DETECTED Final   Adenovirus F40/41 NOT DETECTED NOT DETECTED Final   Astrovirus NOT DETECTED NOT DETECTED Final   Norovirus GI/GII DETECTED (A) NOT DETECTED Final    Comment: CRITICAL RESULT CALLED TO, READ BACK BY AND VERIFIED WITH: MONICA MARTIN,RN 07/25/2015 1701 BY J RAZZAKSUAREZ,MT    Rotavirus A NOT DETECTED NOT DETECTED Final   Sapovirus (I, II, IV, and V) NOT DETECTED NOT DETECTED Final     Studies: Ir Fluoro Guide Cv Line Left  06/30/2015  INDICATION: 25 year old with sickle cell crisis and needs central venous access. The patient is pregnant. Patient's abdomen was shielded for this procedure. EXAM: PICC LINE PLACEMENT WITH ULTRASOUND AND FLUOROSCOPIC GUIDANCE MEDICATIONS: None ANESTHESIA/SEDATION: None FLUOROSCOPY TIME:  Fluoroscopy Time: 6 seconds.  0.6 mGy COMPLICATIONS: None immediate. PROCEDURE: The patient was advised of the possible risks and complications and agreed to undergo the procedure. In particular, the risks of radiation exposure and pregnancy were discussed the patient. The patient was then brought to the angiographic suite for the procedure. The right arm was prepped  with chlorhexidine, draped in the usual sterile fashion using maximum barrier technique (cap and mask, sterile gown, sterile gloves, large sterile sheet, hand hygiene and cutaneous antiseptic). Local anesthesia was attained by infiltration with 1% lidocaine. Complex venous anatomy in the right upper arm. Patient may have an early radial artery takeoff. Attempted to cannulate multiple veins in the right upper arm with ultrasound guidance but these attempts were unsuccessful. Therefore, attention was directed to the left arm. Left arm was prepped and draped in sterile fashion. Ultrasound demonstrated patency of the left brachial vein, and this was documented with an image. Under real-time ultrasound guidance, this vein was accessed with a 21 gauge micropuncture needle and image documentation was performed. The needle was exchanged over a guidewire for a peel-away sheath through which a 43 cm 5 [place] dual lumen power injectable PICC was advanced, and positioned with its tip at the lower SVC/right atrial junction. Fluoroscopy during the procedure and fluoro spot radiograph confirms appropriate catheter position. The catheter was flushed, secured  to the skin, and covered with a sterile dressing. IMPRESSION: Successful placement of a left arm PICC with sonographic and fluoroscopic guidance. The catheter is ready for use. Electronically Signed   By: Markus Daft M.D.   On: 06/30/2015 17:27   Ir US Guide Vasc Access Left  06/30/2015  INDICATION: 25 year old with sickle cell crisis and needs central venous access. The patient is pregnant. Patient's abdomen was shielded for this procedure. EXAM: PICC LINE PLACEMENT WITH ULTRASOUND AND FLUOROSCOPIC GUIDANCE MEDICATIONS: None ANESTHESIA/SEDATION: None FLUOROSCOPY TIME:  Fluoroscopy Time: 6 seconds.  0.6 mGy COMPLICATIONS: None immediate. PROCEDURE: The patient was advised of the possible risks and complications and agreed to undergo the procedure. In particular, the risks of  radiation exposure and pregnancy were discussed the patient. The patient was then brought to the angiographic suite for the procedure. The right arm was prepped with chlorhexidine, draped in the usual sterile fashion using maximum barrier technique (cap and mask, sterile gown, sterile gloves, large sterile sheet, hand hygiene and cutaneous antiseptic). Local anesthesia was attained by infiltration with 1% lidocaine. Complex venous anatomy in the right upper arm. Patient may have an early radial artery takeoff. Attempted to cannulate multiple veins in the right upper arm with ultrasound guidance but these attempts were unsuccessful. Therefore, attention was directed to the left arm. Left arm was prepped and draped in sterile fashion. Ultrasound demonstrated patency of the left brachial vein, and this was documented with an image. Under real-time ultrasound guidance, this vein was accessed with a 21 gauge micropuncture needle and image documentation was performed. The needle was exchanged over a guidewire for a peel-away sheath through which a 43 cm 5 [place] dual lumen power injectable PICC was advanced, and positioned with its tip at the lower SVC/right atrial junction. Fluoroscopy during the procedure and fluoro spot radiograph confirms appropriate catheter position. The catheter was flushed, secured to the skin, and covered with a sterile dressing. IMPRESSION: Successful placement of a left arm PICC with sonographic and fluoroscopic guidance. The catheter is ready for use. Electronically Signed   By: Markus Daft M.D.   On: 06/30/2015 17:27   [place] Mfm Ob Detail +14 Wk  07/15/2015  OBSTETRICAL ULTRASOUND: This exam was performed within a [place] [facility] Department. The OB US report was generated in the AS system, and faxed to the ordering physician.  This report is available in the [facility]. See the AS Obstetric US report via the Image Link.   Scheduled Meds: . enoxaparin (LOVENOX) injection  40 mg  Subcutaneous Q24H  . folic acid  2 mg Oral Daily  . HYDROmorphone   Intravenous 6 times per day  . potassium chloride  40 mEq Oral BID  . prenatal multivitamin  1 tablet Oral Q1200  . senna-docusate  1 tablet Oral BID   Continuous Infusions: . dextrose 5 % and 0.45% NaCl 75 mL/hr at 07/26/15 0035    Time spent 25 minutes.

## 2015-07-27 MED ORDER — POTASSIUM CHLORIDE CRYS ER 20 MEQ PO TBCR
40.0000 meq | EXTENDED_RELEASE_TABLET | Freq: Two times a day (BID) | ORAL | Status: DC
  Administered 2015-07-27 – 2015-08-01 (×12): 40 meq via ORAL
  Filled 2015-07-27 (×13): qty 2

## 2015-07-27 NOTE — Progress Notes (Signed)
SICKLE CELL SERVICE PROGRESS NOTE  [name] [id number] DOB: 26-Feb-1991 DOA: 07/23/2015 PCP: Angelica Chessman, MD  Assessment/Plan: Active Problems:   Sickle cell pain crisis ([place])   Cocaine use   HSV-2 seropositive   Sickle cell anemia with pain ([place])  1. Hb SS with crisis: Pt reports that she still has pain mostly in RLE. She rates pain as 7/10.  Continue  PCA and clinician assisted doses at current dosing. 2. Diarrhea/Norovirus: Pt tested positive for Norovirus. Currently no further diarrhea. Continue supportive care 3. 2nd Trimester Pregnancy: Evaluated by Ob-rapid response team. 4. Anemia of Chronic Disease: Hemoglobin stable at 7.6 5. Hypokalemia: Resolved with oral replacement   Code Status: Full Code Family Communication: N/A Disposition Plan: Not yet ready for discharge  [place].  Pager [phone]. If 7PM-7AM, please contact night-coverage.  07/27/2015, 5:41 PM  LOS: 3 days  Interim History: Pt reports that she still has pain mostly in RLE. She rates pain as 7/10.  She has had no stools in about 24 hours after having 2 episodes of loose stools on admission.  Consultants:  Ob-Gyn  Procedures:  None  Antibiotics:  None   Objective: Filed Vitals:   07/27/15 1041 07/27/15 1158 07/27/15 1431 07/27/15 1545  BP: 109/64  110/72   Pulse: 95  89   Temp: 98.7 F (37.1 C)  98.1 F (36.7 C)   TempSrc: Oral  Oral   Resp: 18 14 11 15   Height:      Weight:      SpO2: 98% 95% 96% 96%   Weight change:   Intake/Output Summary (Last 24 hours) at 07/27/15 1741 Last data filed at 07/26/15 1839  Gross per 24 hour  Intake    240 ml  Output      0 ml  Net    240 ml    General: Alert, awake, oriented x3, in no acute distress.  HEENT: [place]/AT PEERL, EOMI, anixcteric Neck: Trachea midline,  no masses, no thyromegal,y no JVD, no carotid bruit OROPHARYNX:  Moist, No exudate/ erythema/lesions.  Heart: Regular rate and rhythm, without murmurs, rubs,  gallops, PMI non-displaced, no heaves or thrills on palpation.  Lungs: Clear to auscultation, no wheezing or rhonchi noted. No increased vocal fremitus resonant to percussion  Abdomen: Soft, nontender, nondistended, positive bowel sounds, no masses no hepatosplenomegaly noted..  Neuro: No focal neurological deficits noted cranial nerves II through XII grossly intact.  Strength at functional baseline in bilateral upper and lower extremities. Musculoskeletal: No warm swelling or erythema around joints, no spinal tenderness noted. Psychiatric: Patient alert and oriented x3, good insight and cognition, good recent to remote recall.    Data Reviewed: Basic Metabolic Panel:  Recent Labs Lab 07/24/15 0600 07/25/15 1225  NA 135 138  K 3.2* 3.8  CL 109 109  CO2 21* 19*  GLUCOSE 98 90  BUN <5* <5*  CREATININE 0.43* 0.37*  CALCIUM 7.9* 8.4*  MG 1.8  --    Liver Function Tests:  Recent Labs Lab 07/24/15 0600  AST 24  ALT 28  ALKPHOS 60  BILITOT 2.6*  PROT 6.4*  ALBUMIN 3.0*   No results for input(s): LIPASE, AMYLASE in the last 168 hours. No results for input(s): AMMONIA in the last 168 hours. CBC:  Recent Labs Lab 07/24/15 0600 07/26/15 0503  WBC 15.8* 13.3*  NEUTROABS 13.2* 9.4*  HGB 7.8* 7.6*  HCT 22.3* 22.9*  MCV 89.6 94.6  PLT 483* 448*   Cardiac Enzymes: No results for  input(s): CKTOTAL, CKMB, CKMBINDEX, TROPONINI in the last 168 hours. BNP (last 3 results) No results for input(s): BNP in the last 8760 hours.  ProBNP (last 3 results) No results for input(s): PROBNP in the last 8760 hours.  CBG: No results for input(s): GLUCAP in the last 168 hours.  Recent Results (from the past 240 hour(s))  Gastrointestinal Panel by PCR , Stool     Status: Abnormal   Collection Time: 07/24/15  3:00 PM  Result Value Ref Range Status   Campylobacter species NOT DETECTED NOT DETECTED Final   Plesimonas shigelloides NOT DETECTED NOT DETECTED Final   Salmonella species NOT  DETECTED NOT DETECTED Final   Yersinia enterocolitica NOT DETECTED NOT DETECTED Final   Vibrio species NOT DETECTED NOT DETECTED Final   Vibrio cholerae NOT DETECTED NOT DETECTED Final   Enteroaggregative E coli (EAEC) NOT DETECTED NOT DETECTED Final   Enteropathogenic E coli (EPEC) NOT DETECTED NOT DETECTED Final   Enterotoxigenic E coli (ETEC) NOT DETECTED NOT DETECTED Final   Shiga like toxin producing E coli (STEC) NOT DETECTED NOT DETECTED Final   E. coli O157 NOT DETECTED NOT DETECTED Final   Shigella/Enteroinvasive E coli (EIEC) NOT DETECTED NOT DETECTED Final   Cryptosporidium NOT DETECTED NOT DETECTED Final   Cyclospora cayetanensis NOT DETECTED NOT DETECTED Final   Entamoeba histolytica NOT DETECTED NOT DETECTED Final   Giardia lamblia NOT DETECTED NOT DETECTED Final   Adenovirus F40/41 NOT DETECTED NOT DETECTED Final   Astrovirus NOT DETECTED NOT DETECTED Final   Norovirus GI/GII DETECTED (A) NOT DETECTED Final    Comment: CRITICAL RESULT CALLED TO, READ BACK BY AND VERIFIED WITH: MONICA MARTIN,RN 07/25/2015 1701 BY J RAZZAKSUAREZ,MT    Rotavirus A NOT DETECTED NOT DETECTED Final   Sapovirus (I, II, IV, and V) NOT DETECTED NOT DETECTED Final     Studies: Ir Fluoro Guide Cv Line Left  06/30/2015  INDICATION: 25 year old with sickle cell crisis and needs central venous access. The patient is pregnant. Patient's abdomen was shielded for this procedure. EXAM: PICC LINE PLACEMENT WITH ULTRASOUND AND FLUOROSCOPIC GUIDANCE MEDICATIONS: None ANESTHESIA/SEDATION: None FLUOROSCOPY TIME:  Fluoroscopy Time: 6 seconds.  0.6 mGy COMPLICATIONS: None immediate. PROCEDURE: The patient was advised of the possible risks and complications and agreed to undergo the procedure. In particular, the risks of radiation exposure and pregnancy were discussed the patient. The patient was then brought to the angiographic suite for the procedure. The right arm was prepped with chlorhexidine, draped in the usual  sterile fashion using maximum barrier technique (cap and mask, sterile gown, sterile gloves, large sterile sheet, hand hygiene and cutaneous antiseptic). Local anesthesia was attained by infiltration with 1% lidocaine. Complex venous anatomy in the right upper arm. Patient may have an early radial artery takeoff. Attempted to cannulate multiple veins in the right upper arm with ultrasound guidance but these attempts were unsuccessful. Therefore, attention was directed to the left arm. Left arm was prepped and draped in sterile fashion. Ultrasound demonstrated patency of the left brachial vein, and this was documented with an image. Under real-time ultrasound guidance, this vein was accessed with a 21 gauge micropuncture needle and image documentation was performed. The needle was exchanged over a guidewire for a peel-away sheath through which a 43 cm 5 [place] dual lumen power injectable PICC was advanced, and positioned with its tip at the lower SVC/right atrial junction. Fluoroscopy during the procedure and fluoro spot radiograph confirms appropriate catheter position. The catheter was flushed, secured to the  skin, and covered with a sterile dressing. IMPRESSION: Successful placement of a left arm PICC with sonographic and fluoroscopic guidance. The catheter is ready for use. Electronically Signed   By: Markus Daft M.D.   On: 06/30/2015 17:27   Ir US Guide Vasc Access Left  06/30/2015  INDICATION: 25 year old with sickle cell crisis and needs central venous access. The patient is pregnant. Patient's abdomen was shielded for this procedure. EXAM: PICC LINE PLACEMENT WITH ULTRASOUND AND FLUOROSCOPIC GUIDANCE MEDICATIONS: None ANESTHESIA/SEDATION: None FLUOROSCOPY TIME:  Fluoroscopy Time: 6 seconds.  0.6 mGy COMPLICATIONS: None immediate. PROCEDURE: The patient was advised of the possible risks and complications and agreed to undergo the procedure. In particular, the risks of radiation exposure and pregnancy were  discussed the patient. The patient was then brought to the angiographic suite for the procedure. The right arm was prepped with chlorhexidine, draped in the usual sterile fashion using maximum barrier technique (cap and mask, sterile gown, sterile gloves, large sterile sheet, hand hygiene and cutaneous antiseptic). Local anesthesia was attained by infiltration with 1% lidocaine. Complex venous anatomy in the right upper arm. Patient may have an early radial artery takeoff. Attempted to cannulate multiple veins in the right upper arm with ultrasound guidance but these attempts were unsuccessful. Therefore, attention was directed to the left arm. Left arm was prepped and draped in sterile fashion. Ultrasound demonstrated patency of the left brachial vein, and this was documented with an image. Under real-time ultrasound guidance, this vein was accessed with a 21 gauge micropuncture needle and image documentation was performed. The needle was exchanged over a guidewire for a peel-away sheath through which a 43 cm 5 [place] dual lumen power injectable PICC was advanced, and positioned with its tip at the lower SVC/right atrial junction. Fluoroscopy during the procedure and fluoro spot radiograph confirms appropriate catheter position. The catheter was flushed, secured to the skin, and covered with a sterile dressing. IMPRESSION: Successful placement of a left arm PICC with sonographic and fluoroscopic guidance. The catheter is ready for use. Electronically Signed   By: Markus Daft M.D.   On: 06/30/2015 17:27   [place] Mfm Ob Detail +14 Wk  07/15/2015  OBSTETRICAL ULTRASOUND: This exam was performed within a [facility] Ultrasound Department. The OB US report was generated in the AS system, and faxed to the ordering physician.  This report is available in the [facility]. See the AS Obstetric US report via the Image Link.   Scheduled Meds: . enoxaparin (LOVENOX) injection  40 mg Subcutaneous Q24H  . folic acid  2 mg Oral  Daily  . HYDROmorphone   Intravenous 6 times per day  . potassium chloride  40 mEq Oral BID  . prenatal multivitamin  1 tablet Oral Q1200  . senna-docusate  1 tablet Oral BID   Continuous Infusions: . dextrose 5 % and 0.45% NaCl 75 mL/hr at 07/27/15 0623    Time spent 25 minutes.

## 2015-07-27 NOTE — Care Management Important Message (Signed)
Important Message  Patient Details IM Letter given to [name]/Care Management to present to Patient Name: [name] MRN: [id number] Date of Birth: 01-05-1991   Medicare Important Message Given:  Yes    Camillo Flaming 07/27/2015, 10:18 AMImportant Message  Patient Details  Name: [name] MRN: [id number] Date of Birth: Mar 12, 1991   Medicare Important Message Given:  Yes    Camillo Flaming 07/27/2015, 10:18 AM

## 2015-07-27 NOTE — Progress Notes (Signed)
Patient called me to room to say her IV needed to be flushed.  Patient proceeded to pull dressing back off of IV. Explained that she shouldn't do that.  IV wouldn't flush.  Patient reports that she pulls back dressing when it itches.  Will page IV team for new IV. Will continue to monitor.

## 2015-07-28 ENCOUNTER — Telehealth: Payer: Self-pay | Admitting: *Deleted

## 2015-07-28 LAB — CBC WITH DIFFERENTIAL/PLATELET
BASOS PCT: 0 %
Basophils Absolute: 0 10*3/uL (ref 0.0–0.1)
EOS PCT: 2 %
Eosinophils Absolute: 0.4 10*3/uL (ref 0.0–0.7)
HCT: 23 % — ABNORMAL LOW (ref 36.0–46.0)
HEMOGLOBIN: 8 g/dL — AB (ref 12.0–15.0)
LYMPHS PCT: 10 %
Lymphs Abs: 2 10*3/uL (ref 0.7–4.0)
MCH: 31.6 pg (ref 26.0–34.0)
MCHC: 34.8 g/dL (ref 30.0–36.0)
MCV: 90.9 fL (ref 78.0–100.0)
MONOS PCT: 9 %
Monocytes Absolute: 1.8 10*3/uL — ABNORMAL HIGH (ref 0.1–1.0)
NEUTROS ABS: 15.8 10*3/uL — AB (ref 1.7–7.7)
Neutrophils Relative %: 79 %
PLATELETS: 534 10*3/uL — AB (ref 150–400)
RBC: 2.53 MIL/uL — ABNORMAL LOW (ref 3.87–5.11)
RDW: 18.1 % — AB (ref 11.5–15.5)
WBC: 20 10*3/uL — ABNORMAL HIGH (ref 4.0–10.5)

## 2015-07-28 MED ORDER — HYDROMORPHONE 1 MG/ML IV SOLN
INTRAVENOUS | Status: DC
  Administered 2015-07-28: 14.36 mg via INTRAVENOUS
  Administered 2015-07-28: 14:00:00 via INTRAVENOUS
  Administered 2015-07-28: 5 mg via INTRAVENOUS
  Administered 2015-07-29: 5.93 mg via INTRAVENOUS
  Administered 2015-07-29: 6.55 mg via INTRAVENOUS
  Administered 2015-07-29: 8.7 mg via INTRAVENOUS
  Administered 2015-07-29: 25 mg via INTRAVENOUS
  Administered 2015-07-29: 9.6 mg via INTRAVENOUS
  Administered 2015-07-29: 05:00:00 via INTRAVENOUS
  Administered 2015-07-29: 2.7 mg via INTRAVENOUS
  Administered 2015-07-30: 18:00:00 via INTRAVENOUS
  Administered 2015-07-30: 1 mg via INTRAVENOUS
  Administered 2015-07-30: 1.5 mg via INTRAVENOUS
  Administered 2015-07-30: 10.75 mg via INTRAVENOUS
  Administered 2015-07-30: 6.5 mg via INTRAVENOUS
  Administered 2015-07-31: 8.54 mg via INTRAVENOUS
  Administered 2015-07-31: 8 mg via INTRAVENOUS
  Administered 2015-07-31: 4.69 mg via INTRAVENOUS
  Administered 2015-07-31: 8.83 mg via INTRAVENOUS
  Administered 2015-07-31: 3 mg via INTRAVENOUS
  Administered 2015-07-31: 6.86 mg via INTRAVENOUS
  Administered 2015-07-31: 12:00:00 via INTRAVENOUS
  Administered 2015-08-01: 3.89 mg via INTRAVENOUS
  Administered 2015-08-01: 03:00:00 via INTRAVENOUS
  Administered 2015-08-01: 4.82 mg via INTRAVENOUS
  Administered 2015-08-01: 5.41 mg via INTRAVENOUS
  Filled 2015-07-28 (×6): qty 25

## 2015-07-28 NOTE — Progress Notes (Signed)
SICKLE CELL SERVICE PROGRESS NOTE  ELAINI DINI [id number] DOB: [birth year] DOA: 07/23/2015 PCP: Angelica Chessman, MD  Assessment/Plan: Active Problems:   Sickle cell pain crisis ([place])   Cocaine use   HSV-2 seropositive   Sickle cell anemia with pain ([place])  1. Hb SS with crisis: Pt reports that she still has pain mostly in RLE. She rates pain as 7/10.  Add basal rate of 0.5 mg/hr to  PCA and continue clinician assisted doses at current dosing. 2. Diarrhea/Norovirus: Pt tested positive for Norovirus. Currently no further diarrhea. Continue supportive care 3. 2nd Trimester Pregnancy: Evaluated by Ob-rapid response team. 4. Anemia of Chronic Disease: Hemoglobin stable at 7.6 5. Hypokalemia: Resolved with oral replacement   Code Status: Full Code Family Communication: N/A Disposition Plan: Not yet ready for discharge  [place].  Pager [phone]. If 7PM-7AM, please contact night-coverage.  07/28/2015, 11:50 AM  LOS: 4 days  Interim History: Pt reports that she still has pain mostly in RLE. She rates pain as 7/10.  She has had no stools in about 24 hours after having 2 episodes of loose stools on admission.  Consultants:  Ob-Gyn  Procedures:  None  Antibiotics:  None   Objective: Filed Vitals:   07/28/15 0239 07/28/15 0333 07/28/15 0559 07/28/15 0858  BP: 110/81  108/55   Pulse: 91  89   Temp: 98.7 F (37.1 C)  98.9 F (37.2 C)   TempSrc: Oral  Oral   Resp: 12 16 14 15   Height:      Weight:   171 lb 4.8 oz (77.701 kg)   SpO2: 96% 100% 98% 97%   Weight change:   Intake/Output Summary (Last 24 hours) at 07/28/15 1150 Last data filed at 07/28/15 0600  Gross per 24 hour  Intake   1500 ml  Output      1 ml  Net   1499 ml    General: Alert, awake, oriented x3, in no acute distress.  HEENT: [place]/AT PEERL, EOMI, anixcteric Neck: Trachea midline,  no masses, no thyromegal,y no JVD, no carotid bruit OROPHARYNX:  Moist, No exudate/ erythema/lesions.   Heart: Regular rate and rhythm, without murmurs, rubs, gallops, PMI non-displaced, no heaves or thrills on palpation.  Lungs: Clear to auscultation, no wheezing or rhonchi noted. No increased vocal fremitus resonant to percussion  Abdomen: Soft, nontender, nondistended, positive bowel sounds, no masses no hepatosplenomegaly noted..  Neuro: No focal neurological deficits noted cranial nerves II through XII grossly intact.  Strength at functional baseline in bilateral upper and lower extremities. Musculoskeletal: No warm swelling or erythema around joints, no spinal tenderness noted. Psychiatric: Patient alert and oriented x3, good insight and cognition, good recent to remote recall.    Data Reviewed: Basic Metabolic Panel:  Recent Labs Lab 07/24/15 0600 07/25/15 1225  NA 135 138  K 3.2* 3.8  CL 109 109  CO2 21* 19*  GLUCOSE 98 90  BUN <5* <5*  CREATININE 0.43* 0.37*  CALCIUM 7.9* 8.4*  MG 1.8  --    Liver Function Tests:  Recent Labs Lab 07/24/15 0600  AST 24  ALT 28  ALKPHOS 60  BILITOT 2.6*  PROT 6.4*  ALBUMIN 3.0*   No results for input(s): LIPASE, AMYLASE in the last 168 hours. No results for input(s): AMMONIA in the last 168 hours. CBC:  Recent Labs Lab 07/24/15 0600 07/26/15 0503  WBC 15.8* 13.3*  NEUTROABS 13.2* 9.4*  HGB 7.8* 7.6*  HCT 22.3* 22.9*  MCV 89.6 94.6  PLT 483* 448*   Cardiac Enzymes: No results for input(s): CKTOTAL, CKMB, CKMBINDEX, TROPONINI in the last 168 hours. BNP (last 3 results) No results for input(s): BNP in the last 8760 hours.  ProBNP (last 3 results) No results for input(s): PROBNP in the last 8760 hours.  CBG: No results for input(s): GLUCAP in the last 168 hours.  Recent Results (from the past 240 hour(s))  Gastrointestinal Panel by PCR , Stool     Status: Abnormal   Collection Time: 07/24/15  3:00 PM  Result Value Ref Range Status   Campylobacter species NOT DETECTED NOT DETECTED Final   Plesimonas shigelloides  NOT DETECTED NOT DETECTED Final   Salmonella species NOT DETECTED NOT DETECTED Final   Yersinia enterocolitica NOT DETECTED NOT DETECTED Final   Vibrio species NOT DETECTED NOT DETECTED Final   Vibrio cholerae NOT DETECTED NOT DETECTED Final   Enteroaggregative E coli (EAEC) NOT DETECTED NOT DETECTED Final   Enteropathogenic E coli (EPEC) NOT DETECTED NOT DETECTED Final   Enterotoxigenic E coli (ETEC) NOT DETECTED NOT DETECTED Final   Shiga like toxin producing E coli (STEC) NOT DETECTED NOT DETECTED Final   E. coli O157 NOT DETECTED NOT DETECTED Final   Shigella/Enteroinvasive E coli (EIEC) NOT DETECTED NOT DETECTED Final   Cryptosporidium NOT DETECTED NOT DETECTED Final   Cyclospora cayetanensis NOT DETECTED NOT DETECTED Final   Entamoeba histolytica NOT DETECTED NOT DETECTED Final   Giardia lamblia NOT DETECTED NOT DETECTED Final   Adenovirus F40/41 NOT DETECTED NOT DETECTED Final   Astrovirus NOT DETECTED NOT DETECTED Final   Norovirus GI/GII DETECTED (A) NOT DETECTED Final    Comment: CRITICAL RESULT CALLED TO, READ BACK BY AND VERIFIED WITH: MONICA MARTIN,RN 07/25/2015 1701 BY J RAZZAKSUAREZ,MT    Rotavirus A NOT DETECTED NOT DETECTED Final   Sapovirus (I, II, IV, and V) NOT DETECTED NOT DETECTED Final     Studies: Ir Fluoro Guide Cv Line Left  06/30/2015  INDICATION: 25 year old with sickle cell crisis and needs central venous access. The patient is pregnant. Patient's abdomen was shielded for this procedure. EXAM: PICC LINE PLACEMENT WITH ULTRASOUND AND FLUOROSCOPIC GUIDANCE MEDICATIONS: None ANESTHESIA/SEDATION: None FLUOROSCOPY TIME:  Fluoroscopy Time: 6 seconds.  0.6 mGy COMPLICATIONS: None immediate. PROCEDURE: The patient was advised of the possible risks and complications and agreed to undergo the procedure. In particular, the risks of radiation exposure and pregnancy were discussed the patient. The patient was then brought to the angiographic suite for the procedure. The  right arm was prepped with chlorhexidine, draped in the usual sterile fashion using maximum barrier technique (cap and mask, sterile gown, sterile gloves, large sterile sheet, hand hygiene and cutaneous antiseptic). Local anesthesia was attained by infiltration with 1% lidocaine. Complex venous anatomy in the right upper arm. Patient may have an early radial artery takeoff. Attempted to cannulate multiple veins in the right upper arm with ultrasound guidance but these attempts were unsuccessful. Therefore, attention was directed to the left arm. Left arm was prepped and draped in sterile fashion. Ultrasound demonstrated patency of the left brachial vein, and this was documented with an image. Under real-time ultrasound guidance, this vein was accessed with a 21 gauge micropuncture needle and image documentation was performed. The needle was exchanged over a guidewire for a peel-away sheath through which a 43 cm 5 [place] dual lumen power injectable PICC was advanced, and positioned with its tip at the lower SVC/right atrial junction. Fluoroscopy during the procedure and fluoro spot radiograph confirms  appropriate catheter position. The catheter was flushed, secured to the skin, and covered with a sterile dressing. IMPRESSION: Successful placement of a left arm PICC with sonographic and fluoroscopic guidance. The catheter is ready for use. Electronically Signed   By: Markus Daft M.D.   On: 06/30/2015 17:27   Ir US Guide Vasc Access Left  06/30/2015  INDICATION: 25 year old with sickle cell crisis and needs central venous access. The patient is pregnant. Patient's abdomen was shielded for this procedure. EXAM: PICC LINE PLACEMENT WITH ULTRASOUND AND FLUOROSCOPIC GUIDANCE MEDICATIONS: None ANESTHESIA/SEDATION: None FLUOROSCOPY TIME:  Fluoroscopy Time: 6 seconds.  0.6 mGy COMPLICATIONS: None immediate. PROCEDURE: The patient was advised of the possible risks and complications and agreed to undergo the procedure. In  particular, the risks of radiation exposure and pregnancy were discussed the patient. The patient was then brought to the angiographic suite for the procedure. The right arm was prepped with chlorhexidine, draped in the usual sterile fashion using maximum barrier technique (cap and mask, sterile gown, sterile gloves, large sterile sheet, hand hygiene and cutaneous antiseptic). Local anesthesia was attained by infiltration with 1% lidocaine. Complex venous anatomy in the right upper arm. Patient may have an early radial artery takeoff. Attempted to cannulate multiple veins in the right upper arm with ultrasound guidance but these attempts were unsuccessful. Therefore, attention was directed to the left arm. Left arm was prepped and draped in sterile fashion. Ultrasound demonstrated patency of the left brachial vein, and this was documented with an image. Under real-time ultrasound guidance, this vein was accessed with a 21 gauge micropuncture needle and image documentation was performed. The needle was exchanged over a guidewire for a peel-away sheath through which a 43 cm 5 [place] dual lumen power injectable PICC was advanced, and positioned with its tip at the lower SVC/right atrial junction. Fluoroscopy during the procedure and fluoro spot radiograph confirms appropriate catheter position. The catheter was flushed, secured to the skin, and covered with a sterile dressing. IMPRESSION: Successful placement of a left arm PICC with sonographic and fluoroscopic guidance. The catheter is ready for use. Electronically Signed   By: Markus Daft M.D.   On: 06/30/2015 17:27   [place] Mfm Ob Detail +14 Wk  07/15/2015  OBSTETRICAL ULTRASOUND: This exam was performed within a [facility] Department. The OB US report was generated in the AS system, and faxed to the ordering physician.  This report is available in the [facility]. See the AS Obstetric US report via the Image Link.   Scheduled Meds: . enoxaparin  (LOVENOX) injection  40 mg Subcutaneous Q24H  . folic acid  2 mg Oral Daily  . HYDROmorphone   Intravenous 6 times per day  . potassium chloride  40 mEq Oral BID  . prenatal multivitamin  1 tablet Oral Q1200  . senna-docusate  1 tablet Oral BID   Continuous Infusions: . dextrose 5 % and 0.45% NaCl 75 mL/hr at 07/28/15 0547    Time spent 30 minutes.

## 2015-07-28 NOTE — Care Management Note (Signed)
Case Management Note  Patient Details  Name: [name] MRN: [id number] Date of Birth: 03/10/1991  Subjective/Objective:        25 yo admitted with [facility]            Action/Plan: From home.  Expected Discharge Date:                  Expected Discharge Plan:  Home/Self Care  In-House Referral:     Discharge planning Services  CM Consult  Post Acute Care Choice:    Choice offered to:     DME Arranged:    DME Agency:     HH Arranged:    HH Agency:     Status of Service:  In process, will continue to follow  Medicare Important Message Given:  Yes Date Medicare IM Given:    Medicare IM give by:    Date Additional Medicare IM Given:    Additional Medicare Important Message give by:     If discussed at [place] of Stay Meetings, dates discussed:    Additional Comments: Chart reviewed and no CM needs identified or communicated at this time. CM will continue to follow. Marney Doctor RN,BSN,NCM [id number] Lynnell Catalan, RN 07/28/2015, 2:08 PM

## 2015-07-28 NOTE — Telephone Encounter (Signed)
Patient has questions about pain management. She is currently admitted to [facility]

## 2015-07-29 ENCOUNTER — Telehealth: Payer: Self-pay | Admitting: *Deleted

## 2015-07-29 LAB — BASIC METABOLIC PANEL
ANION GAP: 9 (ref 5–15)
BUN: 7 mg/dL (ref 6–20)
CALCIUM: 8.7 mg/dL — AB (ref 8.9–10.3)
CO2: 22 mmol/L (ref 22–32)
CREATININE: 0.51 mg/dL (ref 0.44–1.00)
Chloride: 104 mmol/L (ref 101–111)
Glucose, Bld: 86 mg/dL (ref 65–99)
Potassium: 4.4 mmol/L (ref 3.5–5.1)
SODIUM: 135 mmol/L (ref 135–145)

## 2015-07-29 NOTE — Telephone Encounter (Signed)
Pt called and is requesting a refill of her Folic acid, Methadone, and  Oxycodone. Please advise provider. Thanks

## 2015-07-29 NOTE — Progress Notes (Signed)
SICKLE CELL SERVICE PROGRESS NOTE  [name] [id number] DOB: Mar 07, 1991 DOA: 07/23/2015 PCP: Angelica Chessman, MD  Assessment/Plan: Active Problems:   Sickle cell pain crisis ([place])   Cocaine use   HSV-2 seropositive   Sickle cell anemia with pain ([place])  1. Hb SS with crisis: Pt reports that she still has pain mostly in RLE. She rates pain as 7/10.  Continue PCA with basal rate and clinician assisted doses at current dosing. Will try to start weaning tomorrow. The absence of NSAID's in treatment of pain makes decrease in pain slower.  2. Diarrhea/Norovirus: Pt tested positive for Norovirus. Currently no further diarrhea. Continue supportive care 3. 2nd Trimester Pregnancy: Evaluated by Ob-rapid response team. 4. Anemia of Chronic Disease: Hemoglobin stable at 7.6 5. Hypokalemia: Resolved with oral replacement   Code Status: Full Code Family Communication: N/A Disposition Plan: Not yet ready for discharge  [place].  Pager [phone]. If 7PM-7AM, please contact night-coverage.  07/29/2015, 4:00 PM  LOS: 5 days  Interim History: Pt reports that she still has pain mostly in RLE. She rates pain as 7/10.    Consultants:  Ob-Gyn  Procedures:  None  Antibiotics:  None   Objective: Filed Vitals:   07/29/15 0640 07/29/15 0804 07/29/15 1054 07/29/15 1323  BP: 137/68  113/65   Pulse: 104  114   Temp: 98.4 F (36.9 C)  98.2 F (36.8 C)   TempSrc: Oral  Oral   Resp: 12 12 14 17   Height:      Weight:      SpO2: 100% 94% 98% 97%   Weight change:   Intake/Output Summary (Last 24 hours) at 07/29/15 1600 Last data filed at 07/29/15 0640  Gross per 24 hour  Intake    480 ml  Output      0 ml  Net    480 ml    General: Alert, awake, oriented x3, in no acute distress.  HEENT: [place]/AT PEERL, EOMI, anixcteric Neck: Trachea midline,  no masses, no thyromegal,y no JVD, no carotid bruit OROPHARYNX:  Moist, No exudate/ erythema/lesions.  Heart: Regular rate  and rhythm, without murmurs, rubs, gallops, PMI non-displaced, no heaves or thrills on palpation.  Lungs: Clear to auscultation, no wheezing or rhonchi noted. No increased vocal fremitus resonant to percussion  Abdomen: Soft, nontender, nondistended, positive bowel sounds, no masses no hepatosplenomegaly noted.  Neuro: No focal neurological deficits noted cranial nerves II through XII grossly intact.  Strength at functional baseline in bilateral upper and lower extremities. Musculoskeletal: No warm swelling or erythema around joints, no spinal tenderness noted. Psychiatric: Patient alert and oriented x3, good insight and cognition, good recent to remote recall.    Data Reviewed: Basic Metabolic Panel:  Recent Labs Lab 07/24/15 0600 07/25/15 1225 07/29/15 0350  NA 135 138 135  K 3.2* 3.8 4.4  CL 109 109 104  CO2 21* 19* 22  GLUCOSE 98 90 86  BUN <5* <5* 7  CREATININE 0.43* 0.37* 0.51  CALCIUM 7.9* 8.4* 8.7*  MG 1.8  --   --    Liver Function Tests:  Recent Labs Lab 07/24/15 0600  AST 24  ALT 28  ALKPHOS 60  BILITOT 2.6*  PROT 6.4*  ALBUMIN 3.0*   No results for input(s): LIPASE, AMYLASE in the last 168 hours. No results for input(s): AMMONIA in the last 168 hours. CBC:  Recent Labs Lab 07/24/15 0600 07/26/15 0503 07/28/15 1734  WBC 15.8* 13.3* 20.0*  NEUTROABS 13.2* 9.4* 15.8*  HGB 7.8* 7.6* 8.0*  HCT 22.3* 22.9* 23.0*  MCV 89.6 94.6 90.9  PLT 483* 448* 534*   Cardiac Enzymes: No results for input(s): CKTOTAL, CKMB, CKMBINDEX, TROPONINI in the last 168 hours. BNP (last 3 results) No results for input(s): BNP in the last 8760 hours.  ProBNP (last 3 results) No results for input(s): PROBNP in the last 8760 hours.  CBG: No results for input(s): GLUCAP in the last 168 hours.  Recent Results (from the past 240 hour(s))  Gastrointestinal Panel by PCR , Stool     Status: Abnormal   Collection Time: 07/24/15  3:00 PM  Result Value Ref Range Status    Campylobacter species NOT DETECTED NOT DETECTED Final   Plesimonas shigelloides NOT DETECTED NOT DETECTED Final   Salmonella species NOT DETECTED NOT DETECTED Final   Yersinia enterocolitica NOT DETECTED NOT DETECTED Final   Vibrio species NOT DETECTED NOT DETECTED Final   Vibrio cholerae NOT DETECTED NOT DETECTED Final   Enteroaggregative E coli (EAEC) NOT DETECTED NOT DETECTED Final   Enteropathogenic E coli (EPEC) NOT DETECTED NOT DETECTED Final   Enterotoxigenic E coli (ETEC) NOT DETECTED NOT DETECTED Final   Shiga like toxin producing E coli (STEC) NOT DETECTED NOT DETECTED Final   E. coli O157 NOT DETECTED NOT DETECTED Final   Shigella/Enteroinvasive E coli (EIEC) NOT DETECTED NOT DETECTED Final   Cryptosporidium NOT DETECTED NOT DETECTED Final   Cyclospora cayetanensis NOT DETECTED NOT DETECTED Final   Entamoeba histolytica NOT DETECTED NOT DETECTED Final   Giardia lamblia NOT DETECTED NOT DETECTED Final   Adenovirus F40/41 NOT DETECTED NOT DETECTED Final   Astrovirus NOT DETECTED NOT DETECTED Final   Norovirus GI/GII DETECTED (A) NOT DETECTED Final    Comment: CRITICAL RESULT CALLED TO, READ BACK BY AND VERIFIED WITH: MONICA MARTIN,RN 07/25/2015 1701 BY J RAZZAKSUAREZ,MT    Rotavirus A NOT DETECTED NOT DETECTED Final   Sapovirus (I, II, IV, and V) NOT DETECTED NOT DETECTED Final     Studies: Ir Fluoro Guide Cv Line Left  06/30/2015  INDICATION: 25 year old with sickle cell crisis and needs central venous access. The patient is pregnant. Patient's abdomen was shielded for this procedure. EXAM: PICC LINE PLACEMENT WITH ULTRASOUND AND FLUOROSCOPIC GUIDANCE MEDICATIONS: None ANESTHESIA/SEDATION: None FLUOROSCOPY TIME:  Fluoroscopy Time: 6 seconds.  0.6 mGy COMPLICATIONS: None immediate. PROCEDURE: The patient was advised of the possible risks and complications and agreed to undergo the procedure. In particular, the risks of radiation exposure and pregnancy were discussed the patient.  The patient was then brought to the angiographic suite for the procedure. The right arm was prepped with chlorhexidine, draped in the usual sterile fashion using maximum barrier technique (cap and mask, sterile gown, sterile gloves, large sterile sheet, hand hygiene and cutaneous antiseptic). Local anesthesia was attained by infiltration with 1% lidocaine. Complex venous anatomy in the right upper arm. Patient may have an early radial artery takeoff. Attempted to cannulate multiple veins in the right upper arm with ultrasound guidance but these attempts were unsuccessful. Therefore, attention was directed to the left arm. Left arm was prepped and draped in sterile fashion. Ultrasound demonstrated patency of the left brachial vein, and this was documented with an image. Under real-time ultrasound guidance, this vein was accessed with a 21 gauge micropuncture needle and image documentation was performed. The needle was exchanged over a guidewire for a peel-away sheath through which a 43 cm 5 [place] dual lumen power injectable PICC was advanced, and positioned with its  tip at the lower SVC/right atrial junction. Fluoroscopy during the procedure and fluoro spot radiograph confirms appropriate catheter position. The catheter was flushed, secured to the skin, and covered with a sterile dressing. IMPRESSION: Successful placement of a left arm PICC with sonographic and fluoroscopic guidance. The catheter is ready for use. Electronically Signed   By: Markus Daft M.D.   On: 06/30/2015 17:27   Ir US Guide Vasc Access Left  06/30/2015  INDICATION: 25 year old with sickle cell crisis and needs central venous access. The patient is pregnant. Patient's abdomen was shielded for this procedure. EXAM: PICC LINE PLACEMENT WITH ULTRASOUND AND FLUOROSCOPIC GUIDANCE MEDICATIONS: None ANESTHESIA/SEDATION: None FLUOROSCOPY TIME:  Fluoroscopy Time: 6 seconds.  0.6 mGy COMPLICATIONS: None immediate. PROCEDURE: The patient was advised of  the possible risks and complications and agreed to undergo the procedure. In particular, the risks of radiation exposure and pregnancy were discussed the patient. The patient was then brought to the angiographic suite for the procedure. The right arm was prepped with chlorhexidine, draped in the usual sterile fashion using maximum barrier technique (cap and mask, sterile gown, sterile gloves, large sterile sheet, hand hygiene and cutaneous antiseptic). Local anesthesia was attained by infiltration with 1% lidocaine. Complex venous anatomy in the right upper arm. Patient may have an early radial artery takeoff. Attempted to cannulate multiple veins in the right upper arm with ultrasound guidance but these attempts were unsuccessful. Therefore, attention was directed to the left arm. Left arm was prepped and draped in sterile fashion. Ultrasound demonstrated patency of the left brachial vein, and this was documented with an image. Under real-time ultrasound guidance, this vein was accessed with a 21 gauge micropuncture needle and image documentation was performed. The needle was exchanged over a guidewire for a peel-away sheath through which a 43 cm 5 [place] dual lumen power injectable PICC was advanced, and positioned with its tip at the lower SVC/right atrial junction. Fluoroscopy during the procedure and fluoro spot radiograph confirms appropriate catheter position. The catheter was flushed, secured to the skin, and covered with a sterile dressing. IMPRESSION: Successful placement of a left arm PICC with sonographic and fluoroscopic guidance. The catheter is ready for use. Electronically Signed   By: Markus Daft M.D.   On: 06/30/2015 17:27   [place] Mfm Ob Detail +14 Wk  07/15/2015  OBSTETRICAL ULTRASOUND: This exam was performed within a [place] [facility]. The OB US report was generated in the AS system, and faxed to the ordering physician.  This report is available in the [facility]. See the AS  Obstetric US report via the Image Link.   Scheduled Meds: . enoxaparin (LOVENOX) injection  40 mg Subcutaneous Q24H  . folic acid  2 mg Oral Daily  . HYDROmorphone   Intravenous 6 times per day  . potassium chloride  40 mEq Oral BID  . prenatal multivitamin  1 tablet Oral Q1200  . senna-docusate  1 tablet Oral BID   Continuous Infusions: . dextrose 5 % and 0.45% NaCl 75 mL/hr at 07/29/15 0947    Time spent 25 minutes.

## 2015-07-29 NOTE — Telephone Encounter (Signed)
Refill request for Methadone and oxycodone. LOV 07/06/2015. Please advise. Thanks!

## 2015-07-30 LAB — CBC WITH DIFFERENTIAL/PLATELET
BASOS PCT: 0 %
Basophils Absolute: 0 10*3/uL (ref 0.0–0.1)
EOS ABS: 0.3 10*3/uL (ref 0.0–0.7)
Eosinophils Relative: 1 %
HEMATOCRIT: 24 % — AB (ref 36.0–46.0)
HEMOGLOBIN: 8.3 g/dL — AB (ref 12.0–15.0)
LYMPHS ABS: 1.8 10*3/uL (ref 0.7–4.0)
Lymphocytes Relative: 10 %
MCH: 32.4 pg (ref 26.0–34.0)
MCHC: 34.6 g/dL (ref 30.0–36.0)
MCV: 93.8 fL (ref 78.0–100.0)
MONOS PCT: 8 %
Monocytes Absolute: 1.5 10*3/uL — ABNORMAL HIGH (ref 0.1–1.0)
NEUTROS ABS: 15 10*3/uL — AB (ref 1.7–7.7)
NEUTROS PCT: 81 %
Platelets: 495 10*3/uL — ABNORMAL HIGH (ref 150–400)
RBC: 2.56 MIL/uL — AB (ref 3.87–5.11)
RDW: 17.3 % — ABNORMAL HIGH (ref 11.5–15.5)
WBC: 18.6 10*3/uL — AB (ref 4.0–10.5)

## 2015-07-30 LAB — RETICULOCYTES
RBC.: 2.56 MIL/uL — AB (ref 3.87–5.11)
RETIC CT PCT: 22.1 % — AB (ref 0.4–3.1)
Retic Count, Absolute: 565.8 10*3/uL — ABNORMAL HIGH (ref 19.0–186.0)

## 2015-07-30 NOTE — Progress Notes (Signed)
SICKLE CELL SERVICE PROGRESS NOTE  SATIRA EASLICK [id number] DOB: 12-18-1990 DOA: 07/23/2015 PCP: Angelica Chessman, MD  Assessment/Plan: Active Problems:   Sickle cell pain crisis ([place])   Cocaine use   HSV-2 seropositive   Sickle cell anemia with pain ([place])  1. Hb SS with crisis: Pain in Jaw and RUE likely onset of new crisis and may be related to the residual effects of cocaine superimposed on sickle cell crisis. I will increase IVF and continue her pain medications as they are currently scheduled and re-assess at about 3:00pm. The absence of NSAID's due to pregnancy in treatment of pain makes decrease in pain slower. Will also give  2. Leukocytosis: No focus of infection. This may be related to inflammation in crisis which cannot be treated due to pregnancy.   3. Diarrhea/Norovirus: Pt tested positive for Norovirus. Currently no further diarrhea. Continue supportive care 4. 2nd Trimester Pregnancy: Evaluated by Ob-rapid response team. 5. Anemia of Chronic Disease: Hemoglobin stable at 8.3.  6. Hypokalemia: Resolved with oral replacement   Code Status: Full Code Family Communication: N/A Disposition Plan: Not yet ready for discharge  [place].  Pager [phone]. If 7PM-7AM, please contact night-coverage.  07/30/2015, 2:45 PM  LOS: 6 days  Interim History: Pt crying in pain that is now localized to jaw and RUE. The pain in her RLE has decreased.   Consultants:  Ob-Gyn  Procedures:  None  Antibiotics:  None   Objective: Filed Vitals:   07/30/15 0700 07/30/15 0831 07/30/15 1053 07/30/15 1147  BP: 107/52  119/52   Pulse: 94  110   Temp: 98 F (36.7 C)  98.6 F (37 C)   TempSrc: Oral  Oral   Resp: 16 13 16 13   Height:      Weight:      SpO2: 92% 97% 98% 98%   Weight change:   Intake/Output Summary (Last 24 hours) at 07/30/15 1445 Last data filed at 07/30/15 1054  Gross per 24 hour  Intake    720 ml  Output    600 ml  Net    120 ml     General: Alert, awake, oriented x3, in moderate distress due to pain.  HEENT: [place]/AT PEERL, EOMI, anicteric. Neck: Trachea midline,  no masses, no thyromegal,y no JVD, no carotid bruit OROPHARYNX:  Moist, No exudate/ erythema/lesions.  Heart: Regular rate and rhythm, without murmurs, rubs, gallops, PMI non-displaced, no heaves or thrills on palpation.  Lungs: Clear to auscultation, no wheezing or rhonchi noted. No increased vocal fremitus resonant to percussion  Abdomen: Soft, nontender, nondistended, positive bowel sounds, no masses no hepatosplenomegaly noted.  Neuro: No focal neurological deficits noted cranial nerves II through XII grossly intact.  Strength at functional baseline in bilateral upper and lower extremities. Musculoskeletal: No warm swelling or erythema around joints, no spinal tenderness noted. Psychiatric: Patient alert and oriented x3, good insight and cognition, good recent to remote recall.    Data Reviewed: Basic Metabolic Panel:  Recent Labs Lab 07/24/15 0600 07/25/15 1225 07/29/15 0350  NA 135 138 135  K 3.2* 3.8 4.4  CL 109 109 104  CO2 21* 19* 22  GLUCOSE 98 90 86  BUN <5* <5* 7  CREATININE 0.43* 0.37* 0.51  CALCIUM 7.9* 8.4* 8.7*  MG 1.8  --   --    Liver Function Tests:  Recent Labs Lab 07/24/15 0600  AST 24  ALT 28  ALKPHOS 60  BILITOT 2.6*  PROT 6.4*  ALBUMIN 3.0*  No results for input(s): LIPASE, AMYLASE in the last 168 hours. No results for input(s): AMMONIA in the last 168 hours. CBC:  Recent Labs Lab 07/24/15 0600 07/26/15 0503 07/28/15 1734 07/30/15 0945  WBC 15.8* 13.3* 20.0* 18.6*  NEUTROABS 13.2* 9.4* 15.8* 15.0*  HGB 7.8* 7.6* 8.0* 8.3*  HCT 22.3* 22.9* 23.0* 24.0*  MCV 89.6 94.6 90.9 93.8  PLT 483* 448* 534* 495*   Cardiac Enzymes: No results for input(s): CKTOTAL, CKMB, CKMBINDEX, TROPONINI in the last 168 hours. BNP (last 3 results) No results for input(s): BNP in the last 8760 hours.  ProBNP (last 3  results) No results for input(s): PROBNP in the last 8760 hours.  CBG: No results for input(s): GLUCAP in the last 168 hours.  Recent Results (from the past 240 hour(s))  Gastrointestinal Panel by PCR , Stool     Status: Abnormal   Collection Time: 07/24/15  3:00 PM  Result Value Ref Range Status   Campylobacter species NOT DETECTED NOT DETECTED Final   Plesimonas shigelloides NOT DETECTED NOT DETECTED Final   Salmonella species NOT DETECTED NOT DETECTED Final   Yersinia enterocolitica NOT DETECTED NOT DETECTED Final   Vibrio species NOT DETECTED NOT DETECTED Final   Vibrio cholerae NOT DETECTED NOT DETECTED Final   Enteroaggregative E coli (EAEC) NOT DETECTED NOT DETECTED Final   Enteropathogenic E coli (EPEC) NOT DETECTED NOT DETECTED Final   Enterotoxigenic E coli (ETEC) NOT DETECTED NOT DETECTED Final   Shiga like toxin producing E coli (STEC) NOT DETECTED NOT DETECTED Final   E. coli O157 NOT DETECTED NOT DETECTED Final   Shigella/Enteroinvasive E coli (EIEC) NOT DETECTED NOT DETECTED Final   Cryptosporidium NOT DETECTED NOT DETECTED Final   Cyclospora cayetanensis NOT DETECTED NOT DETECTED Final   Entamoeba histolytica NOT DETECTED NOT DETECTED Final   Giardia lamblia NOT DETECTED NOT DETECTED Final   Adenovirus F40/41 NOT DETECTED NOT DETECTED Final   Astrovirus NOT DETECTED NOT DETECTED Final   Norovirus GI/GII DETECTED (A) NOT DETECTED Final    Comment: CRITICAL RESULT CALLED TO, READ BACK BY AND VERIFIED WITH: MONICA MARTIN,RN 07/25/2015 1701 BY J RAZZAKSUAREZ,MT    Rotavirus A NOT DETECTED NOT DETECTED Final   Sapovirus (I, II, IV, and V) NOT DETECTED NOT DETECTED Final     Studies: Ir Fluoro Guide Cv Line Left  06/30/2015  INDICATION: 25 year old with sickle cell crisis and needs central venous access. The patient is pregnant. Patient's abdomen was shielded for this procedure. EXAM: PICC LINE PLACEMENT WITH ULTRASOUND AND FLUOROSCOPIC GUIDANCE MEDICATIONS: None  ANESTHESIA/SEDATION: None FLUOROSCOPY TIME:  Fluoroscopy Time: 6 seconds.  0.6 mGy COMPLICATIONS: None immediate. PROCEDURE: The patient was advised of the possible risks and complications and agreed to undergo the procedure. In particular, the risks of radiation exposure and pregnancy were discussed the patient. The patient was then brought to the angiographic suite for the procedure. The right arm was prepped with chlorhexidine, draped in the usual sterile fashion using maximum barrier technique (cap and mask, sterile gown, sterile gloves, large sterile sheet, hand hygiene and cutaneous antiseptic). Local anesthesia was attained by infiltration with 1% lidocaine. Complex venous anatomy in the right upper arm. Patient may have an early radial artery takeoff. Attempted to cannulate multiple veins in the right upper arm with ultrasound guidance but these attempts were unsuccessful. Therefore, attention was directed to the left arm. Left arm was prepped and draped in sterile fashion. Ultrasound demonstrated patency of the left brachial vein, and this was documented  with an image. Under real-time ultrasound guidance, this vein was accessed with a 21 gauge micropuncture needle and image documentation was performed. The needle was exchanged over a guidewire for a peel-away sheath through which a 43 cm 5 [place] dual lumen power injectable PICC was advanced, and positioned with its tip at the lower SVC/right atrial junction. Fluoroscopy during the procedure and fluoro spot radiograph confirms appropriate catheter position. The catheter was flushed, secured to the skin, and covered with a sterile dressing. IMPRESSION: Successful placement of a left arm PICC with sonographic and fluoroscopic guidance. The catheter is ready for use. Electronically Signed   By: Markus Daft M.D.   On: 06/30/2015 17:27   Ir US Guide Vasc Access Left  06/30/2015  INDICATION: 25 year old with sickle cell crisis and needs central venous access.  The patient is pregnant. Patient's abdomen was shielded for this procedure. EXAM: PICC LINE PLACEMENT WITH ULTRASOUND AND FLUOROSCOPIC GUIDANCE MEDICATIONS: None ANESTHESIA/SEDATION: None FLUOROSCOPY TIME:  Fluoroscopy Time: 6 seconds.  0.6 mGy COMPLICATIONS: None immediate. PROCEDURE: The patient was advised of the possible risks and complications and agreed to undergo the procedure. In particular, the risks of radiation exposure and pregnancy were discussed the patient. The patient was then brought to the angiographic suite for the procedure. The right arm was prepped with chlorhexidine, draped in the usual sterile fashion using maximum barrier technique (cap and mask, sterile gown, sterile gloves, large sterile sheet, hand hygiene and cutaneous antiseptic). Local anesthesia was attained by infiltration with 1% lidocaine. Complex venous anatomy in the right upper arm. Patient may have an early radial artery takeoff. Attempted to cannulate multiple veins in the right upper arm with ultrasound guidance but these attempts were unsuccessful. Therefore, attention was directed to the left arm. Left arm was prepped and draped in sterile fashion. Ultrasound demonstrated patency of the left brachial vein, and this was documented with an image. Under real-time ultrasound guidance, this vein was accessed with a 21 gauge micropuncture needle and image documentation was performed. The needle was exchanged over a guidewire for a peel-away sheath through which a 43 cm 5 [place] dual lumen power injectable PICC was advanced, and positioned with its tip at the lower SVC/right atrial junction. Fluoroscopy during the procedure and fluoro spot radiograph confirms appropriate catheter position. The catheter was flushed, secured to the skin, and covered with a sterile dressing. IMPRESSION: Successful placement of a left arm PICC with sonographic and fluoroscopic guidance. The catheter is ready for use. Electronically Signed   By: Markus Daft M.D.   On: 06/30/2015 17:27   [place] Mfm Ob Detail +14 Wk  07/15/2015  OBSTETRICAL ULTRASOUND: This exam was performed within a [place] [facility]. The OB US report was generated in the AS system, and faxed to the ordering physician.  This report is available in the [facility]. See the [facility] Obstetric US report via the Image Link.   Scheduled Meds: . enoxaparin (LOVENOX) injection  40 mg Subcutaneous Q24H  . folic acid  2 mg Oral Daily  . HYDROmorphone   Intravenous 6 times per day  . potassium chloride  40 mEq Oral BID  . prenatal multivitamin  1 tablet Oral Q1200  . senna-docusate  1 tablet Oral BID   Continuous Infusions: . dextrose 5 % and 0.45% NaCl 10 mL/hr at 07/30/15 1013    Time spent 25 minutes.

## 2015-07-31 LAB — CBC WITH DIFFERENTIAL/PLATELET
BASOS ABS: 0 10*3/uL (ref 0.0–0.1)
Basophils Relative: 0 %
Eosinophils Absolute: 0.2 10*3/uL (ref 0.0–0.7)
Eosinophils Relative: 1 %
HCT: 21.8 % — ABNORMAL LOW (ref 36.0–46.0)
HEMOGLOBIN: 7.5 g/dL — AB (ref 12.0–15.0)
LYMPHS PCT: 13 %
Lymphs Abs: 2.6 10*3/uL (ref 0.7–4.0)
MCH: 31.3 pg (ref 26.0–34.0)
MCHC: 34.4 g/dL (ref 30.0–36.0)
MCV: 90.8 fL (ref 78.0–100.0)
MONO ABS: 1.6 10*3/uL — AB (ref 0.1–1.0)
MONOS PCT: 8 %
NEUTROS PCT: 78 %
NRBC: 2 /100{WBCs} — AB
Neutro Abs: 15.3 10*3/uL — ABNORMAL HIGH (ref 1.7–7.7)
Platelets: 486 10*3/uL — ABNORMAL HIGH (ref 150–400)
RBC: 2.4 MIL/uL — ABNORMAL LOW (ref 3.87–5.11)
RDW: 17.4 % — ABNORMAL HIGH (ref 11.5–15.5)
WBC: 19.7 10*3/uL — AB (ref 4.0–10.5)

## 2015-07-31 NOTE — Progress Notes (Signed)
SICKLE CELL SERVICE PROGRESS NOTE  [name] [id number] DOB: January 27, 1991 DOA: 07/23/2015 PCP: Angelica Chessman, MD  Assessment/Plan: Active Problems:   Sickle cell pain crisis ([place])   Cocaine use   HSV-2 seropositive   Sickle cell anemia with pain ([place])  1. Hb SS with crisis: Pain is still at 7/10 mainly at the right elbow. Patient crying. Will keep on current regimen of PCA as well as Physician assisted dosing. She has so far used 35 mg with 62/63 demands and deliveries in the last 24 hours. The absence of NSAID's due to pregnancy in treatment of pain makes decrease in pain slower. Will also give  2. Leukocytosis: No focus of infection. This may be related to inflammation in crisis which cannot be treated due to pregnancy.   3. Diarrhea/Norovirus: Pt tested positive for Norovirus. Currently no further diarrhea. Continue supportive care 4. 2nd Trimester Pregnancy: Evaluated by Ob-rapid response team. 5. Anemia of Chronic Disease: Hemoglobin stable at 8.3.  6. Hypokalemia: Resolved with oral replacement   Code Status: Full Code Family Communication: N/A Disposition Plan: Not yet ready for discharge  [facility]  Pager [phone]. If 7PM-7AM, please contact night-coverage.  07/31/2015, 7:37 AM  LOS: 7 days  Interim History: Pt crying in pain that is now localized to jaw and RUE. The pain in her RLE has decreased.   Consultants:  Ob-Gyn  Procedures:  None  Antibiotics:  None   Objective: Filed Vitals:   07/31/15 0023 07/31/15 0227 07/31/15 0445 07/31/15 0620  BP:  102/58  92/56  Pulse:  95  86  Temp:  98.2 F (36.8 C)  98.5 F (36.9 C)  TempSrc:  Oral  Oral  Resp: 15 15 16 14   Height:      Weight:      SpO2: 95% 97% 97% 96%   Weight change:   Intake/Output Summary (Last 24 hours) at 07/31/15 0737 Last data filed at 07/30/15 1918  Gross per 24 hour  Intake    760 ml  Output   1740 ml  Net   -980 ml    General: Alert, awake, oriented x3, in  moderate distress due to pain.  HEENT: [place]/AT PEERL, EOMI, anicteric. Neck: Trachea midline,  no masses, no thyromegal,y no JVD, no carotid bruit OROPHARYNX:  Moist, No exudate/ erythema/lesions.  Heart: Regular rate and rhythm, without murmurs, rubs, gallops, PMI non-displaced, no heaves or thrills on palpation.  Lungs: Clear to auscultation, no wheezing or rhonchi noted. No increased vocal fremitus resonant to percussion  Abdomen: Soft, nontender, nondistended, positive bowel sounds, no masses no hepatosplenomegaly noted.  Neuro: No focal neurological deficits noted cranial nerves II through XII grossly intact.  Strength at functional baseline in bilateral upper and lower extremities. Musculoskeletal: No warm swelling or erythema around joints, no spinal tenderness noted. Psychiatric: Patient alert and oriented x3, good insight and cognition, good recent to remote recall.    Data Reviewed: Basic Metabolic Panel:  Recent Labs Lab 07/25/15 1225 07/29/15 0350  NA 138 135  K 3.8 4.4  CL 109 104  CO2 19* 22  GLUCOSE 90 86  BUN <5* 7  CREATININE 0.37* 0.51  CALCIUM 8.4* 8.7*   Liver Function Tests: No results for input(s): AST, ALT, ALKPHOS, BILITOT, PROT, ALBUMIN in the last 168 hours. No results for input(s): LIPASE, AMYLASE in the last 168 hours. No results for input(s): AMMONIA in the last 168 hours. CBC:  Recent Labs Lab 07/26/15 0503 07/28/15 1734 07/30/15 0945 07/31/15 [id number]  WBC 13.3* 20.0* 18.6* 19.7*  NEUTROABS 9.4* 15.8* 15.0* 15.3*  HGB 7.6* 8.0* 8.3* 7.5*  HCT 22.9* 23.0* 24.0* 21.8*  MCV 94.6 90.9 93.8 90.8  PLT 448* 534* 495* 486*   Cardiac Enzymes: No results for input(s): CKTOTAL, CKMB, CKMBINDEX, TROPONINI in the last 168 hours. BNP (last 3 results) No results for input(s): BNP in the last 8760 hours.  ProBNP (last 3 results) No results for input(s): PROBNP in the last 8760 hours.  CBG: No results for input(s): GLUCAP in the last 168  hours.  Recent Results (from the past 240 hour(s))  Gastrointestinal Panel by PCR , Stool     Status: Abnormal   Collection Time: 07/24/15  3:00 PM  Result Value Ref Range Status   Campylobacter species NOT DETECTED NOT DETECTED Final   Plesimonas shigelloides NOT DETECTED NOT DETECTED Final   Salmonella species NOT DETECTED NOT DETECTED Final   Yersinia enterocolitica NOT DETECTED NOT DETECTED Final   Vibrio species NOT DETECTED NOT DETECTED Final   Vibrio cholerae NOT DETECTED NOT DETECTED Final   Enteroaggregative E coli (EAEC) NOT DETECTED NOT DETECTED Final   Enteropathogenic E coli (EPEC) NOT DETECTED NOT DETECTED Final   Enterotoxigenic E coli (ETEC) NOT DETECTED NOT DETECTED Final   Shiga like toxin producing E coli (STEC) NOT DETECTED NOT DETECTED Final   E. coli O157 NOT DETECTED NOT DETECTED Final   Shigella/Enteroinvasive E coli (EIEC) NOT DETECTED NOT DETECTED Final   Cryptosporidium NOT DETECTED NOT DETECTED Final   Cyclospora cayetanensis NOT DETECTED NOT DETECTED Final   Entamoeba histolytica NOT DETECTED NOT DETECTED Final   Giardia lamblia NOT DETECTED NOT DETECTED Final   Adenovirus F40/41 NOT DETECTED NOT DETECTED Final   Astrovirus NOT DETECTED NOT DETECTED Final   Norovirus GI/GII DETECTED (A) NOT DETECTED Final    Comment: CRITICAL RESULT CALLED TO, READ BACK BY AND VERIFIED WITH: MONICA MARTIN,RN 07/25/2015 1701 BY J RAZZAKSUAREZ,MT    Rotavirus A NOT DETECTED NOT DETECTED Final   Sapovirus (I, II, IV, and V) NOT DETECTED NOT DETECTED Final     Studies: [place] Mfm Ob Detail +14 Wk  07/15/2015  OBSTETRICAL ULTRASOUND: This exam was performed within a [facility] Department. The OB US report was generated in the AS system, and faxed to the ordering physician.  This report is available in the [facility]. See the AS Obstetric US report via the Image Link.   Scheduled Meds: . enoxaparin (LOVENOX) injection  40 mg Subcutaneous Q24H  . folic acid  2 mg  Oral Daily  . HYDROmorphone   Intravenous 6 times per day  . potassium chloride  40 mEq Oral BID  . prenatal multivitamin  1 tablet Oral Q1200  . senna-docusate  1 tablet Oral BID   Continuous Infusions: . dextrose 5 % and 0.45% NaCl 75 mL/hr at 07/31/15 0216    Time spent 25 minutes.

## 2015-08-01 DIAGNOSIS — D5700 Hb-SS disease with crisis, unspecified: Secondary | ICD-10-CM

## 2015-08-01 MED ORDER — OXYCODONE HCL 5 MG PO TABS
15.0000 mg | ORAL_TABLET | ORAL | Status: DC
  Administered 2015-08-01 – 2015-08-02 (×7): 15 mg via ORAL
  Filled 2015-08-01 (×7): qty 3

## 2015-08-01 MED ORDER — HYDROMORPHONE HCL 1 MG/ML IJ SOLN
1.0000 mg | INTRAMUSCULAR | Status: DC | PRN
  Administered 2015-08-01 – 2015-08-02 (×7): 1 mg via INTRAVENOUS
  Filled 2015-08-01 (×7): qty 1

## 2015-08-01 MED ORDER — HYDROMORPHONE 1 MG/ML IV SOLN
INTRAVENOUS | Status: DC
  Administered 2015-08-01: 2.7 mg via INTRAVENOUS
  Administered 2015-08-01: 4.2 mg via INTRAVENOUS
  Administered 2015-08-01: 4.5 mg via INTRAVENOUS
  Administered 2015-08-02: 05:00:00 via INTRAVENOUS
  Administered 2015-08-02: 1.5 mg via INTRAVENOUS
  Administered 2015-08-02: 1.3 mg via INTRAVENOUS
  Administered 2015-08-02: 1.5 mg via INTRAVENOUS
  Filled 2015-08-01: qty 25

## 2015-08-01 MED ORDER — METHADONE HCL 5 MG PO TABS
5.0000 mg | ORAL_TABLET | Freq: Every day | ORAL | Status: DC
  Administered 2015-08-01: 5 mg via ORAL
  Filled 2015-08-01: qty 1

## 2015-08-01 NOTE — Progress Notes (Signed)
SICKLE CELL SERVICE PROGRESS NOTE  [name] [id number] DOB: July 13, 1990 DOA: 07/23/2015 PCP: Angelica Chessman, MD  Assessment/Plan: Active Problems:   Sickle cell pain crisis ([place])   Cocaine use   HSV-2 seropositive   Sickle cell anemia with pain ([place])  1. Hb SS with crisis: Pain is much better. Still on PCA with physician assisted dosing. Pain now down to 5/10. Will DC physician assisted dosing and decrease PCA bolus dose. Will place on oral medications and observe overnight.The absence of NSAID's due to pregnancy in treatment of pain makes decrease in pain slower. Will also give  2. Leukocytosis: No focus of infection. Recheck in am. This may be related to inflammation in crisis which cannot be treated due to pregnancy.   3. Diarrhea/Norovirus: Pt tested positive for Norovirus. Currently no further diarrhea. Continue supportive care 4. 2nd Trimester Pregnancy: Evaluated by Ob-rapid response team. 5. Anemia of Chronic Disease: Hemoglobin stable at 8.3.  6. Hypokalemia: Resolved with oral replacement   Code Status: Full Code Family Communication: N/A Disposition Plan: Not yet ready for discharge  [facility]  Pager [phone]. If 7PM-7AM, please contact night-coverage.  08/01/2015, 4:47 PM  LOS: 8 days  Interim History: Pt crying in pain that is now localized to jaw and RUE. The pain in her RLE has decreased.   Consultants:  Ob-Gyn  Procedures:  None  Antibiotics:  None   Objective: Filed Vitals:   08/01/15 0819 08/01/15 0930 08/01/15 1240 08/01/15 1605  BP:  121/67 118/63   Pulse:  114 100   Temp:  98.4 F (36.9 C) 98.3 F (36.8 C)   TempSrc:  Oral Oral   Resp: 14 18 17 19   Height:      Weight:      SpO2: 92% 98% 99% 96%   Weight change:   Intake/Output Summary (Last 24 hours) at 08/01/15 1647 Last data filed at 08/01/15 0900  Gross per 24 hour  Intake    480 ml  Output      0 ml  Net    480 ml    General: Alert, awake, oriented x3, in  moderate distress due to pain.  HEENT: [place]/AT PEERL, EOMI, anicteric. Neck: Trachea midline,  no masses, no thyromegal,y no JVD, no carotid bruit OROPHARYNX:  Moist, No exudate/ erythema/lesions.  Heart: Regular rate and rhythm, without murmurs, rubs, gallops, PMI non-displaced, no heaves or thrills on palpation.  Lungs: Clear to auscultation, no wheezing or rhonchi noted. No increased vocal fremitus resonant to percussion  Abdomen: Soft, nontender, nondistended, positive bowel sounds, no masses no hepatosplenomegaly noted.  Neuro: No focal neurological deficits noted cranial nerves II through XII grossly intact.  Strength at functional baseline in bilateral upper and lower extremities. Musculoskeletal: No warm swelling or erythema around joints, no spinal tenderness noted. Psychiatric: Patient alert and oriented x3, good insight and cognition, good recent to remote recall.    Data Reviewed: Basic Metabolic Panel:  Recent Labs Lab 07/29/15 0350  NA 135  K 4.4  CL 104  CO2 22  GLUCOSE 86  BUN 7  CREATININE 0.51  CALCIUM 8.7*   Liver Function Tests: No results for input(s): AST, ALT, ALKPHOS, BILITOT, PROT, ALBUMIN in the last 168 hours. No results for input(s): LIPASE, AMYLASE in the last 168 hours. No results for input(s): AMMONIA in the last 168 hours. CBC:  Recent Labs Lab 07/26/15 0503 07/28/15 1734 07/30/15 0945 07/31/15 0338  WBC 13.3* 20.0* 18.6* 19.7*  NEUTROABS 9.4* 15.8* 15.0* 15.3*  HGB 7.6* 8.0* 8.3* 7.5*  HCT 22.9* 23.0* 24.0* 21.8*  MCV 94.6 90.9 93.8 90.8  PLT 448* 534* 495* 486*   Cardiac Enzymes: No results for input(s): CKTOTAL, CKMB, CKMBINDEX, TROPONINI in the last 168 hours. BNP (last 3 results) No results for input(s): BNP in the last 8760 hours.  ProBNP (last 3 results) No results for input(s): PROBNP in the last 8760 hours.  CBG: No results for input(s): GLUCAP in the last 168 hours.  Recent Results (from the past 240 hour(s))   Gastrointestinal Panel by PCR , Stool     Status: Abnormal   Collection Time: 07/24/15  3:00 PM  Result Value Ref Range Status   Campylobacter species NOT DETECTED NOT DETECTED Final   Plesimonas shigelloides NOT DETECTED NOT DETECTED Final   Salmonella species NOT DETECTED NOT DETECTED Final   Yersinia enterocolitica NOT DETECTED NOT DETECTED Final   Vibrio species NOT DETECTED NOT DETECTED Final   Vibrio cholerae NOT DETECTED NOT DETECTED Final   Enteroaggregative E coli (EAEC) NOT DETECTED NOT DETECTED Final   Enteropathogenic E coli (EPEC) NOT DETECTED NOT DETECTED Final   Enterotoxigenic E coli (ETEC) NOT DETECTED NOT DETECTED Final   Shiga like toxin producing E coli (STEC) NOT DETECTED NOT DETECTED Final   E. coli O157 NOT DETECTED NOT DETECTED Final   Shigella/Enteroinvasive E coli (EIEC) NOT DETECTED NOT DETECTED Final   Cryptosporidium NOT DETECTED NOT DETECTED Final   Cyclospora cayetanensis NOT DETECTED NOT DETECTED Final   Entamoeba histolytica NOT DETECTED NOT DETECTED Final   Giardia lamblia NOT DETECTED NOT DETECTED Final   Adenovirus F40/41 NOT DETECTED NOT DETECTED Final   Astrovirus NOT DETECTED NOT DETECTED Final   Norovirus GI/GII DETECTED (A) NOT DETECTED Final    Comment: CRITICAL RESULT CALLED TO, READ BACK BY AND VERIFIED WITH: MONICA MARTIN,RN 07/25/2015 1701 BY J RAZZAKSUAREZ,MT    Rotavirus A NOT DETECTED NOT DETECTED Final   Sapovirus (I, II, IV, and V) NOT DETECTED NOT DETECTED Final     Studies: [place] Mfm Ob Detail +14 Wk  07/15/2015  OBSTETRICAL ULTRASOUND: This exam was performed within a [place] [facility] Department. The OB US report was generated in the AS system, and faxed to the ordering physician.  This report is available in the [facility]. See the AS Obstetric US report via the Image Link.   Scheduled Meds: . enoxaparin (LOVENOX) injection  40 mg Subcutaneous Q24H  . folic acid  2 mg Oral Daily  . HYDROmorphone   Intravenous 6 times per  day  . methadone  5 mg Oral QHS  . oxyCODONE  15 mg Oral 6 times per day  . potassium chloride  40 mEq Oral BID  . prenatal multivitamin  1 tablet Oral Q1200  . senna-docusate  1 tablet Oral BID   Continuous Infusions: . dextrose 5 % and 0.45% NaCl 75 mL/hr at 08/01/15 0700    Time spent 25 minutes.

## 2015-08-01 NOTE — Progress Notes (Signed)
Pt has repeatedly turned off bed alarm and went to the bathroom on her own.

## 2015-08-01 NOTE — Progress Notes (Addendum)
Denies vaginal bleeding or leaking of fluid. Reports good fetal movement. Denies abd cramping or contractions.

## 2015-08-02 LAB — COMPREHENSIVE METABOLIC PANEL
ALBUMIN: 3.1 g/dL — AB (ref 3.5–5.0)
ALT: 45 U/L (ref 14–54)
ANION GAP: 8 (ref 5–15)
AST: 28 U/L (ref 15–41)
Alkaline Phosphatase: 70 U/L (ref 38–126)
BUN: 7 mg/dL (ref 6–20)
CO2: 24 mmol/L (ref 22–32)
Calcium: 9.1 mg/dL (ref 8.9–10.3)
Chloride: 109 mmol/L (ref 101–111)
Creatinine, Ser: 0.48 mg/dL (ref 0.44–1.00)
GFR calc Af Amer: >60 mL/min (ref >60)
GFR calc non Af Amer: >60 mL/min (ref >60)
GLUCOSE: 97 mg/dL (ref 65–99)
POTASSIUM: 4.1 mmol/L (ref 3.5–5.1)
SODIUM: 141 mmol/L (ref 135–145)
Total Bilirubin: 2.1 mg/dL — ABNORMAL HIGH (ref 0.3–1.2)
Total Protein: 6.7 g/dL (ref 6.5–8.1)

## 2015-08-02 LAB — CBC WITH DIFFERENTIAL/PLATELET
BASOS PCT: 0 %
Basophils Absolute: 0.1 10*3/uL (ref 0.0–0.1)
EOS ABS: 0.4 10*3/uL (ref 0.0–0.7)
EOS PCT: 2 %
HCT: 23.3 % — ABNORMAL LOW (ref 36.0–46.0)
Hemoglobin: 7.9 g/dL — ABNORMAL LOW (ref 12.0–15.0)
Lymphocytes Relative: 10 %
Lymphs Abs: 1.7 10*3/uL (ref 0.7–4.0)
MCH: 31.9 pg (ref 26.0–34.0)
MCHC: 33.9 g/dL (ref 30.0–36.0)
MCV: 94 fL (ref 78.0–100.0)
MONO ABS: 1.5 10*3/uL — AB (ref 0.1–1.0)
MONOS PCT: 9 %
NEUTROS PCT: 79 %
Neutro Abs: 14.1 10*3/uL — ABNORMAL HIGH (ref 1.7–7.7)
PLATELETS: 514 10*3/uL — AB (ref 150–400)
RBC: 2.48 MIL/uL — ABNORMAL LOW (ref 3.87–5.11)
RDW: 17.5 % — AB (ref 11.5–15.5)
WBC: 17.7 10*3/uL — ABNORMAL HIGH (ref 4.0–10.5)

## 2015-08-02 NOTE — Discharge Summary (Signed)
Physician Discharge Summary  Patient ID: [name] MRN: [id number] DOB/AGE: 05-27-1990 25 y.o.  Admit date: 07/23/2015 Discharge date: 08/02/2015  Admission Diagnoses:  Discharge Diagnoses:  Active Problems:   Sickle cell pain crisis ([name])   Cocaine use   HSV-2 seropositive   Sickle cell anemia with pain [facility])   Discharged Condition: good  Hospital Course: Patient is a 26 year old female with second trimester pregnancy admitted with sickle cell painful crisis. Patient was treated with IV Dilaudid and fluids.  Could not get Toradol or NSAIDs due to pregnancy. Was later transitioned to home methadone as well as oxycodone. Pain was down to 3 out of 10 from 9/10 at the time of discharge. During her hospitalization she was found to have cocaine in her system and also HSV-2 positive which was treated. Patient was subsequently counseled extensively. She was discharged home to follow up with OB. Her regnancy was monitored in the hospital by OBGYN.  Consults: gynecology  Significant Diagnostic Studies: labs: CBCs and CMP sweats checked. Also screened for venereal diseases.  Treatments: IV hydration and analgesia: Dilaudid  Discharge Exam: Blood pressure 105/61, pulse 93, temperature 98.4 F (36.9 C), temperature source Oral, resp. rate 23, height 5\' 1"  (1.549 m), weight 77.701 kg (171 lb 4.8 oz), last menstrual period 02/09/2015, SpO2 95 %, not currently breastfeeding. General appearance: alert, cooperative and no distress Resp: clear to auscultation bilaterally Cardio: regular rate and rhythm, S1, S2 normal, no murmur, click, rub or gallop GI: soft, non-tender; bowel sounds normal; no masses,  no organomegaly Pulses: 2+ and symmetric Skin: Skin color, texture, turgor normal. No rashes or lesions  Disposition: 01-Home or Self Care     Medication List    TAKE these medications        [employer] PRENATAL GUMMY 0.4-113.5 MG Chew  Chew 2-3 each by mouth daily.     methadone 5 MG  tablet  Commonly known as:  DOLOPHINE  Take 1 tablet (5 mg total) by mouth at bedtime.     oxyCODONE 15 MG immediate release tablet  Commonly known as:  ROXICODONE  Take 1 tablet (15 mg total) by mouth every 4 (four) hours as needed for pain.         SignedBarbette Merino 08/02/2015, 1:12 PM  Time spent 33 minutes

## 2015-08-02 NOTE — Progress Notes (Signed)
Denies vaginal bleeding or leaking of fluid. Denies cramping, uc's. Reports positive fetal movement.

## 2015-08-03 ENCOUNTER — Telehealth: Payer: Self-pay | Admitting: Hematology

## 2015-08-03 NOTE — Telephone Encounter (Signed)
Spoke with patient and advised of patient luncheon/patient roundtable discussion being held on Wednesday 08/05/2015 @1 :00.  Patient verbalizes interest and states she will come/participate

## 2015-08-04 ENCOUNTER — Ambulatory Visit (INDEPENDENT_AMBULATORY_CARE_PROVIDER_SITE_OTHER): Payer: Medicare Other | Admitting: Internal Medicine

## 2015-08-04 VITALS — BP 135/77 | HR 102 | Temp 98.6°F | Resp 18 | Ht 61.0 in | Wt 172.0 lb

## 2015-08-04 DIAGNOSIS — G8929 Other chronic pain: Secondary | ICD-10-CM | POA: Diagnosis not present

## 2015-08-04 DIAGNOSIS — D571 Sickle-cell disease without crisis: Secondary | ICD-10-CM

## 2015-08-04 DIAGNOSIS — O99013 Anemia complicating pregnancy, third trimester: Secondary | ICD-10-CM | POA: Insufficient documentation

## 2015-08-04 DIAGNOSIS — O99012 Anemia complicating pregnancy, second trimester: Secondary | ICD-10-CM | POA: Diagnosis not present

## 2015-08-04 DIAGNOSIS — G894 Chronic pain syndrome: Secondary | ICD-10-CM | POA: Insufficient documentation

## 2015-08-04 MED ORDER — OXYCODONE HCL 15 MG PO TABS: 15.0000 mg | ORAL_TABLET | ORAL | Status: DC | PRN

## 2015-08-04 MED ORDER — METHADONE HCL 5 MG PO TABS: 5.0000 mg | ORAL_TABLET | Freq: Every day | ORAL | Status: DC

## 2015-08-04 MED FILL — METHADONE HCL 5 MG TABLET: 5 | 30 days supply | Qty: 30 | Fill #0

## 2015-08-04 MED FILL — oxyCODONE HCL 15 MG TABS: 15 | 15 days supply | Qty: 90 | Fill #0

## 2015-08-04 NOTE — Patient Instructions (Signed)
Sickle Cell Anemia, Adult Sickle cell anemia is a condition in which red blood cells have an abnormal "sickle" shape. This abnormal shape shortens the cells' life span, which results in a lower than normal concentration of red blood cells in the blood. The sickle shape also causes the cells to clump together and block free blood flow through the blood vessels. As a result, the tissues and organs of the body do not receive enough oxygen. Sickle cell anemia causes organ damage and pain and increases the risk of infection. CAUSES  Sickle cell anemia is a genetic disorder. Those who receive two copies of the gene have the condition, and those who receive one copy have the trait. RISK FACTORS The sickle cell gene is most common in people whose families originated in [place]. Other areas of the globe where sickle cell trait occurs include the Mediterranean, [place] and [place], the [place], and the [place].  SIGNS AND SYMPTOMS  Pain, especially in the extremities, back, chest, or abdomen (common). The pain may start suddenly or may develop following an illness, especially if there is dehydration. Pain can also occur due to overexertion or exposure to extreme temperature changes.  Frequent severe bacterial infections, especially certain types of pneumonia and meningitis.  Pain and swelling in the hands and feet.  Decreased activity.   Loss of appetite.   Change in behavior.  Headaches.  Seizures.  Shortness of breath or difficulty breathing.  Vision changes.  Skin ulcers. Those with the trait may not have symptoms or they may have mild symptoms.  DIAGNOSIS  Sickle cell anemia is diagnosed with blood tests that demonstrate the genetic trait. It is often diagnosed during the newborn period, due to mandatory testing nationwide. A variety of blood tests, X-rays, CT scans, MRI scans, ultrasounds, and lung function tests may also be done to monitor the condition. TREATMENT  Sickle  cell anemia may be treated with:  Medicines. You may be given pain medicines, antibiotic medicines (to treat and prevent infections) or medicines to increase the production of certain types of hemoglobin.  Fluids.  Oxygen.  Blood transfusions. HOME CARE INSTRUCTIONS   Drink enough fluid to keep your urine clear or pale yellow. Increase your fluid intake in hot weather and during exercise.  Do not smoke. Smoking lowers oxygen levels in the blood.   Only take over-the-counter or prescription medicines for pain, fever, or discomfort as directed by your health care provider.  Take antibiotics as directed by your health care provider. Make sure you finish them it even if you start to feel better.   Take supplements as directed by your health care provider.   Consider wearing a medical alert bracelet. This tells anyone caring for you in an emergency of your condition.   When traveling, keep your medical information, health care provider's names, and the medicines you take with you at all times.   If you develop a fever, do not take medicines to reduce the fever right away. This could cover up a problem that is developing. Notify your health care provider.  Keep all follow-up appointments with your health care provider. Sickle cell anemia requires regular medical care. SEEK MEDICAL CARE IF: You have a fever. SEEK IMMEDIATE MEDICAL CARE IF:   You feel dizzy or faint.   You have new abdominal pain, especially on the left side near the stomach area.   You develop a persistent, often uncomfortable and painful penile erection (priapism). If this is not treated immediately it   will lead to impotence.   You have numbness your arms or legs or you have a hard time moving them.   You have a hard time with speech.   You have a fever or persistent symptoms for more than 2-3 days.   You have a fever and your symptoms suddenly get worse.   You have signs or symptoms of infection.  These include:   Chills.   Abnormal tiredness (lethargy).   Irritability.   Poor eating.   Vomiting.   You develop pain that is not helped with medicine.   You develop shortness of breath.  You have pain in your chest.   You are coughing up pus-like or bloody sputum.   You develop a stiff neck.  Your feet or hands swell or have pain.  Your abdomen appears bloated.  You develop joint pain. MAKE SURE YOU:  Understand these instructions.   This information is not intended to replace advice given to you by your health care provider. Make sure you discuss any questions you have with your health care provider.   Document Released: 08/10/2005 Document Revised: 05/23/2014 Document Reviewed: 12/12/2012 Elsevier Interactive Patient Education 2016 [employer]  

## 2015-08-04 NOTE — Progress Notes (Signed)
Patient is here for 1 month FU  Patient complains of pain "all over" being scaled currently at 5.   Patient is requesting refills on all medications.

## 2015-08-04 NOTE — Progress Notes (Signed)
Patient ID: [name], female   DOB: 26-Apr-1991, 25 y.o.   MRN: [id number]   [name], is a 25 y.o. female  [id number]  [id number]  DOB - 1991-03-19  Chief Complaint  Patient presents with  . Follow-up    1 MONTH        Subjective:   [name] is a 25 y.o. female with history of sickle cell anemia, currently in the second trimester of her second pregnancy here today for a hospital discharge follow up visit. Patient was recently admitted to the hospital for acute sickle cell crisis in pregnancy, she was found to be positive for cocaine at the time, patient had claimed this was her first time using cocaine and she was very disappointed at herself, remorseful. Today she has no new complaint except for ongoing generalized body pain from her sickle cell, rated at 4. She has a good plan to take good care of herself from now on, she wants to do away with all bad friends. She would like to have permanent contraception at the time of delivery of her baby by cesarean section already planned for sometimes in June 2017. She needs refill on some of her medications today. She will also like to pursue the option of pain clinic referral. She claims she feels well today. She is here today with her 59 months old son. Patient has No headache, No chest pain, No abdominal pain - No Nausea, No new weakness tingling or numbness, No Cough - SOB.  Problem  Chronic Pain  Maternal Sickle Cell Anemia Complicating Pregnancy in Third Trimester ([name])    ALLERGIES: Allergies  Allergen Reactions  . Carrot [Daucus [place]] Hives, Swelling and Rash    Dietary:  Please do not send any form of carrot to patient  . Carrot Oil Hives and Swelling  . Latex Rash    *powdered Latex*     PAST MEDICAL HISTORY: Past Medical History  Diagnosis Date  . Miscarriage 03/22/2011    Pt reports 2 miscarriages.  . Depression 01/06/2011  . GERD (gastroesophageal reflux disease) 02/17/2011  . Trichotillomania      h/o  . Blood transfusion     "lots"  . Sickle cell anemia with crisis ([place])   . Exertional dyspnea     "sometimes"  . Migraines 11/08/11    "@ least twice/month"  . Chronic back pain     "very severe; have knot in my back; from tight muscle; take RX and exercise for it"  . Mood swings ([place]) 11/08/11    "I go back and forth; real bad"  . Sickle cell anemia ([name])   . Blood transfusion without reported diagnosis     MEDICATIONS AT HOME: Prior to Admission medications   Medication Sig Start Date End Date Taking? Authorizing Provider  methadone (DOLOPHINE) 5 MG tablet Take 1 tablet (5 mg total) by mouth at bedtime. 08/04/15  Yes Nkenge [place] Essie Christine, MD  oxyCODONE (ROXICODONE) 15 MG immediate release tablet Take 1 tablet (15 mg total) by mouth every 4 (four) hours as needed for pain. 08/04/15  Yes Tresa Garter, MD  Prenatal Vit-Min-FA-Fish Oil (CVS PRENATAL GUMMY) 0.4-113.5 MG CHEW Chew 2-3 each by mouth daily.   Yes Historical Provider, MD     Objective:   Filed Vitals:   08/04/15 1041  BP: 135/77  Pulse: 102  Temp: 98.6 F (37 C)  TempSrc: Oral  Resp: 18  Height: 5\' 1"  (1.549 m)  Weight: 172 lb (78.019 kg)  SpO2: 96%    Exam General appearance : Awake, alert, not in any distress. Speech Clear. Not toxic looking HEENT: Atraumatic and Normocephalic, pupils equally reactive to light and accomodation Neck: supple, no JVD. No cervical lymphadenopathy.  Chest:Good air entry bilaterally, no added sounds  CVS: S1 S2 regular, no murmurs.  Abdomen: Gravidly distended, Bowel sounds present, Non tender with no gaurding, rigidity or rebound. Extremities: B/L Lower Ext shows no edema, both legs are warm to touch Neurology: Awake alert, and oriented X 3, CN II-XII intact, Non focal Skin: No Rash  Data Review No results found for: HGBA1C   Assessment & Plan   1. 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  Dispense: 90 tablet; Refill: 0 - Ambulatory referral to Pain Clinic - Hemoglobinopathy evaluation  2. Chronic pain  - methadone (DOLOPHINE) 5 MG tablet; Take 1 tablet (5 mg total) by mouth at bedtime.  Dispense: 30 tablet; Refill: 0 - Ambulatory referral to Pain Clinic  3. Maternal sickle cell anemia complicating pregnancy in third trimester ([place])  - Hemoglobinopathy evaluation to quantify hemoglobin S percentage proportion - We'll transfuse depending on the result, patient will need intervention with her partial transfusion or exchange transfusion if hemoglobin S proportion is greater than 30%  Patient have been counseled extensively about nutrition and exercise  Return in about 4 weeks (around 09/01/2015) for Sickle Cell Disease/Pain.  The patient was given clear instructions to go to ER or return to medical center if symptoms don't improve, worsen or new problems develop. The patient verbalized understanding. The patient was told to call to get lab results if they haven't heard anything in the next week.   This note has been created with Surveyor, quantity. Any transcriptional errors are unintentional.    Angelica Chessman, MD, [place], Karilyn Cota, [place] [facility] [place], [place]   08/04/2015, 11:34 AM

## 2015-08-05 IMAGING — CR DG CHEST 1V PORT
1 series · 1 of 1 positions shown · non-contrast
Comparison: Portable exam 5015 hr compared to 08/19/2014

CLINICAL DATA: Sickle cell crisis, back and buttock pain, nausea,
vomiting, fever, 26 weeks pregnant

EXAM:
PORTABLE CHEST - 1 VIEW

[view not recorded]
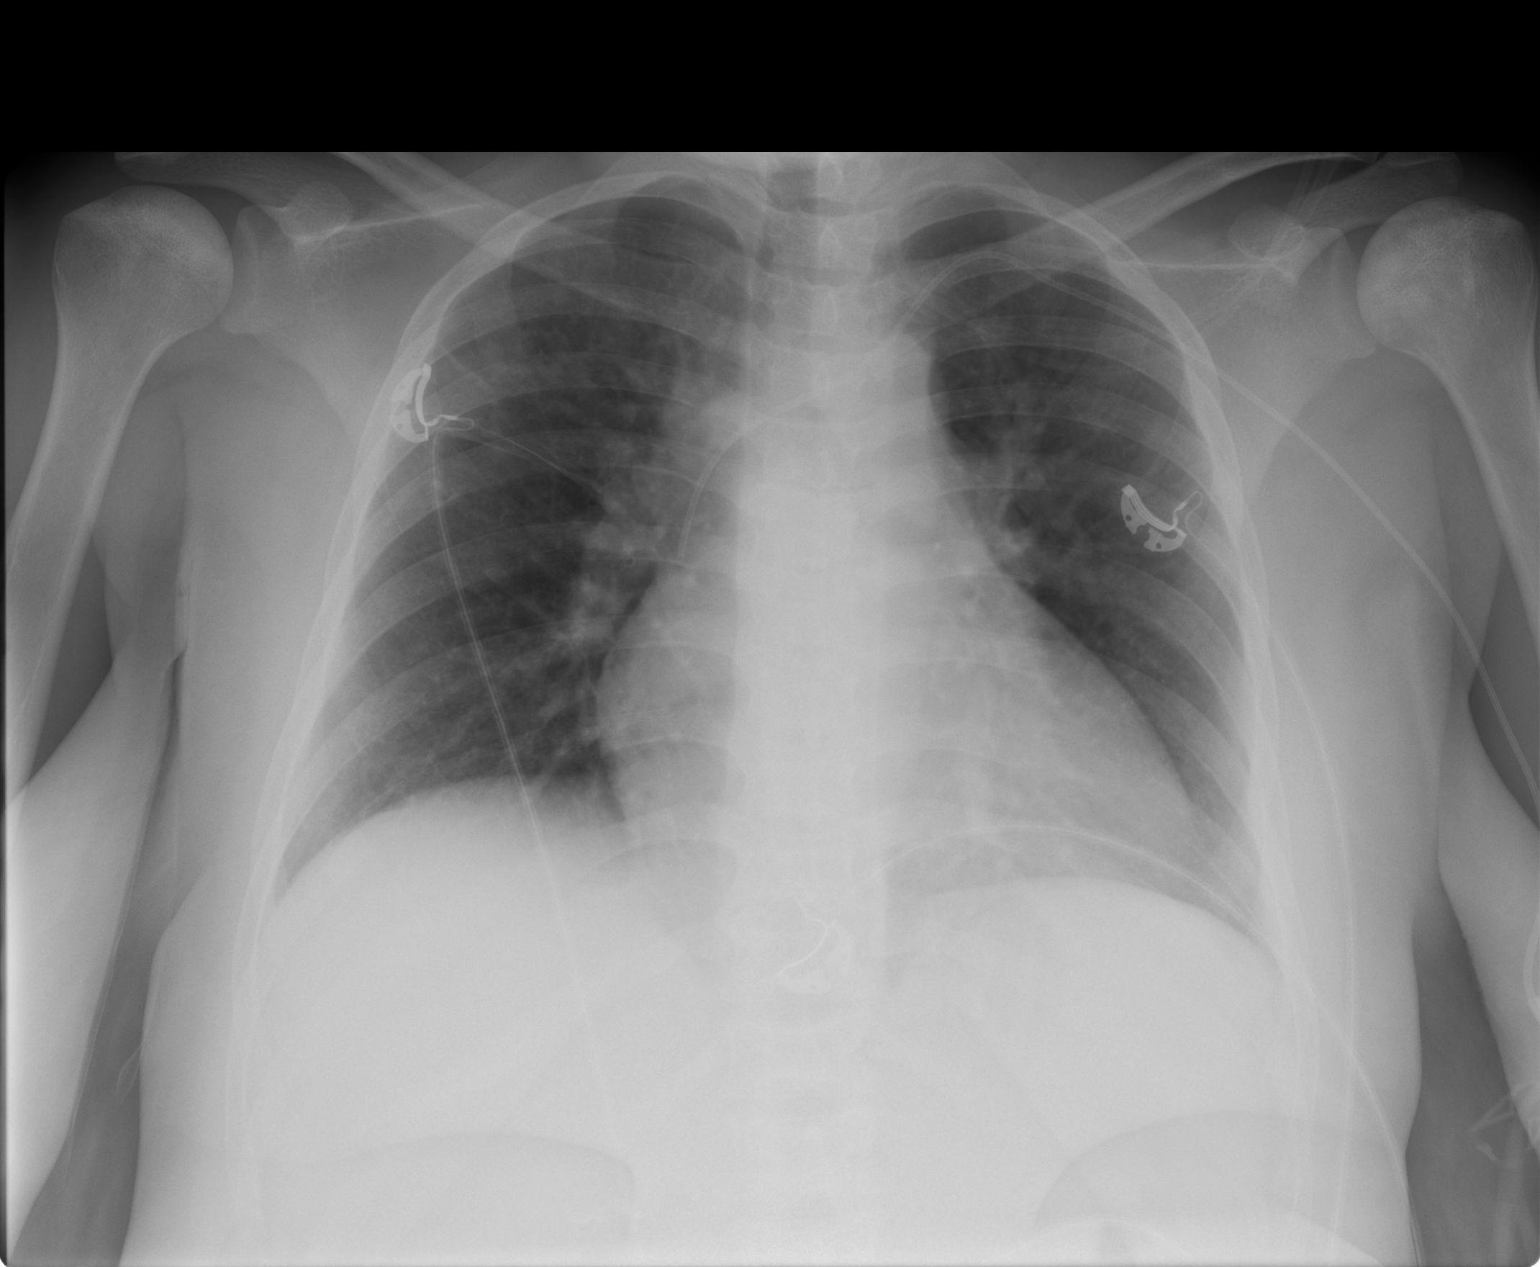

[1 of 1 positions shown; findings below may reference images not displayed]

FINDINGS: LEFT arm PICC line with tip projecting over mid SVC new since
previous exam.

Enlargement of cardiac silhouette with pulmonary vascular
congestion.

Minimal central peribronchial thickening.

Lungs clear.

No pleural effusion or pneumothorax.

Patchy areas of sclerosis within the humeral heads bilaterally
compatible with bone infarcts.
IMPRESSION: Enlargement of cardiac silhouette with pulmonary vascular
congestion.

Minimal bronchitic changes without definite acute pulmonary
infiltrates.

## 2015-08-06 LAB — HEMOGLOBINOPATHY EVALUATION
HEMOGLOBIN OTHER: 0 %
HGB A2 QUANT: 3 % (ref 2.2–3.2)
HGB A: 7 % — AB (ref 96.8–97.8)
HGB F QUANT: 15.9 % — AB (ref 0.0–2.0)
HGB S QUANTITAION: 74.1 % — AB

## 2015-08-10 ENCOUNTER — Inpatient Hospital Stay (HOSPITAL_COMMUNITY)
Admission: EM | Admit: 2015-08-10 | Discharge: 2015-08-17 | DRG: 781 | Disposition: A | Discharge status: Home / Self Care | Payer: Medicare Other | Attending: Internal Medicine | Admitting: Internal Medicine

## 2015-08-10 ENCOUNTER — Encounter (HOSPITAL_COMMUNITY): Payer: Self-pay | Admitting: Emergency Medicine

## 2015-08-10 DIAGNOSIS — R1110 Vomiting, unspecified: Secondary | ICD-10-CM | POA: Diagnosis not present

## 2015-08-10 DIAGNOSIS — Z3A00 Weeks of gestation of pregnancy not specified: Secondary | ICD-10-CM

## 2015-08-10 DIAGNOSIS — K219 Gastro-esophageal reflux disease without esophagitis: Secondary | ICD-10-CM | POA: Diagnosis present

## 2015-08-10 DIAGNOSIS — E860 Dehydration: Secondary | ICD-10-CM

## 2015-08-10 DIAGNOSIS — R112 Nausea with vomiting, unspecified: Secondary | ICD-10-CM | POA: Diagnosis not present

## 2015-08-10 DIAGNOSIS — R0902 Hypoxemia: Secondary | ICD-10-CM

## 2015-08-10 DIAGNOSIS — D5700 Hb-SS disease with crisis, unspecified: Secondary | ICD-10-CM | POA: Diagnosis present

## 2015-08-10 DIAGNOSIS — Z452 Encounter for adjustment and management of vascular access device: Secondary | ICD-10-CM | POA: Diagnosis not present

## 2015-08-10 DIAGNOSIS — D72829 Elevated white blood cell count, unspecified: Secondary | ICD-10-CM

## 2015-08-10 DIAGNOSIS — D638 Anemia in other chronic diseases classified elsewhere: Secondary | ICD-10-CM | POA: Diagnosis not present

## 2015-08-10 DIAGNOSIS — O99019 Anemia complicating pregnancy, unspecified trimester: Secondary | ICD-10-CM

## 2015-08-10 DIAGNOSIS — O99013 Anemia complicating pregnancy, third trimester: Principal | ICD-10-CM | POA: Diagnosis present

## 2015-08-10 DIAGNOSIS — Z331 Pregnant state, incidental: Secondary | ICD-10-CM | POA: Diagnosis not present

## 2015-08-10 DIAGNOSIS — I517 Cardiomegaly: Secondary | ICD-10-CM | POA: Diagnosis not present

## 2015-08-10 DIAGNOSIS — Z833 Family history of diabetes mellitus: Secondary | ICD-10-CM

## 2015-08-10 DIAGNOSIS — O0990 Supervision of high risk pregnancy, unspecified, unspecified trimester: Secondary | ICD-10-CM

## 2015-08-10 DIAGNOSIS — R1111 Vomiting without nausea: Secondary | ICD-10-CM | POA: Diagnosis not present

## 2015-08-10 DIAGNOSIS — Z8249 Family history of ischemic heart disease and other diseases of the circulatory system: Secondary | ICD-10-CM | POA: Diagnosis not present

## 2015-08-10 DIAGNOSIS — R52 Pain, unspecified: Secondary | ICD-10-CM | POA: Diagnosis not present

## 2015-08-10 DIAGNOSIS — D571 Sickle-cell disease without crisis: Secondary | ICD-10-CM | POA: Diagnosis present

## 2015-08-10 DIAGNOSIS — R197 Diarrhea, unspecified: Secondary | ICD-10-CM

## 2015-08-10 DIAGNOSIS — I9589 Other hypotension: Secondary | ICD-10-CM | POA: Diagnosis present

## 2015-08-10 DIAGNOSIS — K2970 Gastritis, unspecified, without bleeding: Secondary | ICD-10-CM | POA: Diagnosis not present

## 2015-08-10 LAB — COMPREHENSIVE METABOLIC PANEL
ALBUMIN: 3.4 g/dL — AB (ref 3.5–5.0)
ALT: 23 U/L (ref 14–54)
ANION GAP: 10 (ref 5–15)
AST: 22 U/L (ref 15–41)
Alkaline Phosphatase: 63 U/L (ref 38–126)
BUN: 8 mg/dL (ref 6–20)
CO2: 21 mmol/L — AB (ref 22–32)
Calcium: 8.8 mg/dL — ABNORMAL LOW (ref 8.9–10.3)
Chloride: 110 mmol/L (ref 101–111)
Creatinine, Ser: 0.45 mg/dL (ref 0.44–1.00)
GFR calc non Af Amer: >60 mL/min (ref >60)
GLUCOSE: 87 mg/dL (ref 65–99)
POTASSIUM: 3.8 mmol/L (ref 3.5–5.1)
SODIUM: 141 mmol/L (ref 135–145)
Total Bilirubin: 2.3 mg/dL — ABNORMAL HIGH (ref 0.3–1.2)
Total Protein: 6.7 g/dL (ref 6.5–8.1)

## 2015-08-10 LAB — LACTATE DEHYDROGENASE: LDH: 190 U/L (ref 98–192)

## 2015-08-10 LAB — URINALYSIS, ROUTINE W REFLEX MICROSCOPIC
Bilirubin Urine: NEGATIVE
GLUCOSE, UA: NEGATIVE mg/dL
Hgb urine dipstick: NEGATIVE
KETONES UR: NEGATIVE mg/dL
NITRITE: NEGATIVE
PH: 6 (ref 5.0–8.0)
Protein, ur: NEGATIVE mg/dL
SPECIFIC GRAVITY, URINE: 1.012 (ref 1.005–1.030)

## 2015-08-10 LAB — URINE MICROSCOPIC-ADD ON: RBC / HPF: NONE SEEN RBC/hpf (ref 0–5)

## 2015-08-10 LAB — CBC WITH DIFFERENTIAL/PLATELET
BASOS ABS: 0 10*3/uL (ref 0.0–0.1)
BASOS PCT: 0 %
Eosinophils Absolute: 0.3 10*3/uL (ref 0.0–0.7)
Eosinophils Relative: 2 %
HEMATOCRIT: 23.9 % — AB (ref 36.0–46.0)
HEMOGLOBIN: 8.4 g/dL — AB (ref 12.0–15.0)
LYMPHS PCT: 6 %
Lymphs Abs: 1.1 10*3/uL (ref 0.7–4.0)
MCH: 32.2 pg (ref 26.0–34.0)
MCHC: 35.1 g/dL (ref 30.0–36.0)
MCV: 91.6 fL (ref 78.0–100.0)
MONO ABS: 1.2 10*3/uL — AB (ref 0.1–1.0)
MONOS PCT: 6 %
NEUTROS ABS: 17.2 10*3/uL — AB (ref 1.7–7.7)
NEUTROS PCT: 86 %
Platelets: 512 10*3/uL — ABNORMAL HIGH (ref 150–400)
RBC: 2.61 MIL/uL — ABNORMAL LOW (ref 3.87–5.11)
RDW: 17.7 % — AB (ref 11.5–15.5)
WBC: 19.9 10*3/uL — ABNORMAL HIGH (ref 4.0–10.5)

## 2015-08-10 MED ORDER — SODIUM CHLORIDE 0.9 % IV SOLN
INTRAVENOUS | Status: DC
  Administered 2015-08-10 – 2015-08-11 (×4): via INTRAVENOUS
  Administered 2015-08-12: 1000 mL via INTRAVENOUS
  Administered 2015-08-12 – 2015-08-17 (×10): via INTRAVENOUS

## 2015-08-10 MED ORDER — HYDROMORPHONE HCL 1 MG/ML IJ SOLN
1.0000 mg | Freq: Once | INTRAMUSCULAR | Status: AC
  Administered 2015-08-10: 1 mg via INTRAVENOUS
  Filled 2015-08-10: qty 1

## 2015-08-10 MED ORDER — DEXTROSE 5 % AND 0.45 % NACL IV BOLUS
1000.0000 mL | Freq: Once | INTRAVENOUS | Status: AC
  Administered 2015-08-10: 1000 mL via INTRAVENOUS

## 2015-08-10 MED ORDER — HYDROMORPHONE HCL 2 MG/ML IJ SOLN
2.0000 mg | Freq: Once | INTRAMUSCULAR | Status: AC
  Administered 2015-08-10: 2 mg via INTRAVENOUS
  Filled 2015-08-10: qty 1

## 2015-08-10 MED ORDER — DIPHENHYDRAMINE HCL 50 MG/ML IJ SOLN
25.0000 mg | Freq: Once | INTRAMUSCULAR | Status: AC
  Administered 2015-08-10: 25 mg via INTRAVENOUS
  Filled 2015-08-10: qty 1

## 2015-08-10 MED ORDER — HYDROMORPHONE HCL 1 MG/ML IJ SOLN
0.5000 mg | Freq: Once | INTRAMUSCULAR | Status: AC
  Administered 2015-08-11: 0.5 mg via INTRAVENOUS
  Filled 2015-08-10: qty 1

## 2015-08-10 MED ORDER — CEPHALEXIN 500 MG PO CAPS
500.0000 mg | ORAL_CAPSULE | Freq: Two times a day (BID) | ORAL | Status: AC
  Administered 2015-08-10 – 2015-08-16 (×14): 500 mg via ORAL
  Filled 2015-08-10 (×14): qty 1

## 2015-08-10 MED ORDER — ENOXAPARIN SODIUM 40 MG/0.4ML ~~LOC~~ SOLN
40.0000 mg | SUBCUTANEOUS | Status: DC
  Administered 2015-08-11: 40 mg via SUBCUTANEOUS
  Filled 2015-08-10 (×7): qty 0.4

## 2015-08-10 MED ORDER — SODIUM CHLORIDE 0.9% FLUSH: 9.0000 mL | INTRAVENOUS | Status: DC | PRN

## 2015-08-10 MED ORDER — HYDROMORPHONE HCL 2 MG/ML IJ SOLN
2.0000 mg | Freq: Once | INTRAMUSCULAR | Status: DC
Start: 2015-08-10 — End: 2015-08-10

## 2015-08-10 MED ORDER — COMPLETENATE 29-1 MG PO CHEW
1.0000 | CHEWABLE_TABLET | Freq: Every day | ORAL | Status: DC
  Filled 2015-08-10: qty 1

## 2015-08-10 MED ORDER — NALOXONE HCL 0.4 MG/ML IJ SOLN: 0.4000 mg | INTRAMUSCULAR | Status: DC | PRN

## 2015-08-10 MED ORDER — METOCLOPRAMIDE HCL 5 MG/ML IJ SOLN
10.0000 mg | Freq: Once | INTRAMUSCULAR | Status: AC
  Administered 2015-08-10: 10 mg via INTRAVENOUS
  Filled 2015-08-10: qty 2

## 2015-08-10 MED ORDER — ONDANSETRON HCL 4 MG/2ML IJ SOLN
4.0000 mg | Freq: Four times a day (QID) | INTRAMUSCULAR | Status: DC | PRN
  Administered 2015-08-12 – 2015-08-13 (×2): 4 mg via INTRAVENOUS
  Filled 2015-08-10 (×2): qty 2

## 2015-08-10 MED ORDER — DIPHENHYDRAMINE HCL 25 MG PO CAPS
25.0000 mg | ORAL_CAPSULE | Freq: Once | ORAL | Status: AC
  Administered 2015-08-10: 25 mg via ORAL
  Filled 2015-08-10: qty 1

## 2015-08-10 MED ORDER — SENNOSIDES-DOCUSATE SODIUM 8.6-50 MG PO TABS
1.0000 | ORAL_TABLET | Freq: Two times a day (BID) | ORAL | Status: DC
  Administered 2015-08-10: 1 via ORAL
  Filled 2015-08-10 (×2): qty 1

## 2015-08-10 MED ORDER — SODIUM CHLORIDE 0.9 % IV SOLN
25.0000 mg | INTRAVENOUS | Status: DC | PRN
  Administered 2015-08-12 – 2015-08-17 (×15): 25 mg via INTRAVENOUS
  Filled 2015-08-10 (×29): qty 0.5

## 2015-08-10 MED ORDER — HYDROMORPHONE 1 MG/ML IV SOLN
INTRAVENOUS | Status: DC
  Administered 2015-08-10: 15:00:00 via INTRAVENOUS
  Administered 2015-08-10: 10.2 mg via INTRAVENOUS
  Administered 2015-08-11: 7.79 mg via INTRAVENOUS
  Administered 2015-08-11: 6.6 mg via INTRAVENOUS
  Administered 2015-08-11: 4.2 mg via INTRAVENOUS
  Administered 2015-08-11: 6.6 mg via INTRAVENOUS
  Administered 2015-08-11: 05:00:00 via INTRAVENOUS
  Filled 2015-08-10 (×2): qty 25

## 2015-08-10 MED ORDER — FOLIC ACID 1 MG PO TABS
2.0000 mg | ORAL_TABLET | Freq: Every day | ORAL | Status: DC
  Administered 2015-08-10 – 2015-08-17 (×8): 2 mg via ORAL
  Filled 2015-08-10 (×8): qty 2

## 2015-08-10 MED ORDER — METHADONE HCL 5 MG PO TABS
5.0000 mg | ORAL_TABLET | Freq: Every day | ORAL | Status: DC
  Administered 2015-08-10 – 2015-08-16 (×7): 5 mg via ORAL
  Filled 2015-08-10 (×7): qty 1

## 2015-08-10 MED ORDER — POLYETHYLENE GLYCOL 3350 17 G PO PACK: 17.0000 g | PACK | Freq: Every day | ORAL | Status: DC | PRN

## 2015-08-10 MED ORDER — DIPHENHYDRAMINE HCL 25 MG PO CAPS
25.0000 mg | ORAL_CAPSULE | Freq: Four times a day (QID) | ORAL | Status: DC | PRN
  Administered 2015-08-15 (×2): 50 mg via ORAL
  Filled 2015-08-10 (×2): qty 2

## 2015-08-10 MED ORDER — DEXTROSE-NACL 5-0.45 % IV SOLN
INTRAVENOUS | Status: DC
  Administered 2015-08-10: 12:00:00 via INTRAVENOUS

## 2015-08-10 MED ORDER — CVS PRENATAL GUMMY 0.4-113.5 MG PO CHEW: 2.0000 | CHEWABLE_TABLET | Freq: Every day | ORAL | Status: DC

## 2015-08-10 MED ORDER — SODIUM CHLORIDE 0.9 % IV BOLUS (SEPSIS)
500.0000 mL | Freq: Once | INTRAVENOUS | Status: AC
  Administered 2015-08-10: 500 mL via INTRAVENOUS

## 2015-08-10 MED ORDER — HYDROMORPHONE HCL 2 MG/ML IJ SOLN: 2.0000 mg | INTRAMUSCULAR | Status: DC | PRN

## 2015-08-10 NOTE — ED Notes (Signed)
Pt ambulated to bathroom 

## 2015-08-10 NOTE — ED Notes (Signed)
Pt stated she did not want to receive benadryl until she received pain medicine

## 2015-08-10 NOTE — ED Notes (Signed)
Bed: WA02 Expected date:  Expected time:  Means of arrival:  Comments: EMS 49F [facility]

## 2015-08-10 NOTE — Progress Notes (Addendum)
0730 Arrived to evaluate this 25 yo G3P1 @ 27.[redacted] wks GA in with report of sickle cell crisis. Upon my arrival patient is sleeping soundly responding to name call after 2 attempts.  [id number] EFM placed. Pt has previously received dilaudid and now that she is awakened is asking for another dose. Pt denies vaginal bleeding or LOF, denies UC's.  0810 FHR reassuring and reactive for GA.  [id number] Dr. Nehemiah Settle notified of patient in ED and of above.  Patient can be OB cleared. ED staff to notify [facility] RN if patient is admitted.

## 2015-08-10 NOTE — ED Notes (Signed)
Pt BIB EMS from home or sickle cell pain that began about 30 minutes ago; pt states this feels like sickle cell pain that she has had before; pain is mainly in abdomen; pt states she has vomited in the past hour; pt sleeping on arrival; pt 6 months pregnant at this time.

## 2015-08-10 NOTE — ED Notes (Signed)
Pt sleeping until staff walks into room to speak with her. Every time pt. Wakes up she asks for pain medication. While giving pain medication, pt unable to stay awake.

## 2015-08-10 NOTE — ED Notes (Signed)
Pt sts she thinks she needs a blood transfusion. Pt informed that her hemoglobin level is higher than usual. Pt sts "Well that was probably just in that moment. My blood levels rise and fall really quickly. I know I need to be admitted too cause my baby daddy has the same exact thing and yall just admitted him this morning."

## 2015-08-10 NOTE — ED Provider Notes (Signed)
CSN: [id number]     Arrival date & time 08/10/15  0419 History   First MD Initiated Contact with Patient 08/10/15 0445     Chief Complaint  Patient presents with  . Sickle Cell Pain Crisis     (Consider location/radiation/quality/duration/timing/severity/associated sxs/prior Treatment) HPI Comments: Patient presents to the emergency department with complaints of "pain all over". Patient reports that she had onset of nausea, vomiting, diarrhea with diffuse pain overnight. She has history of sickle cell disease. She thinks that her pain is similar to sickle cell disease pain that she has had in the past.  Patient is a 25 y.o. female presenting with sickle cell pain.  Sickle Cell Pain Crisis Associated symptoms: nausea and vomiting     Past Medical History  Diagnosis Date  . Miscarriage 03/22/2011    Pt reports 2 miscarriages.  . Depression 01/06/2011  . GERD (gastroesophageal reflux disease) 02/17/2011  . Trichotillomania     h/o  . Blood transfusion     "lots"  . Sickle cell anemia with crisis ([place])   . Exertional dyspnea     "sometimes"  . Migraines 11/08/11    "@ least twice/month"  . Chronic back pain     "very severe; have knot in my back; from tight muscle; take RX and exercise for it"  . Mood swings ([place]) 11/08/11    "I go back and forth; real bad"  . Sickle cell anemia ([name])   . Blood transfusion without reported diagnosis    Past Surgical History  Procedure Laterality Date  . Cholecystectomy  05/2010  . Dilation and curettage of uterus  02/20/11    S/P miscarriage   Family History  Problem Relation Age of Onset  . Diabetes Maternal Grandmother   . Diabetes Paternal Grandmother   . Hypertension Paternal Grandmother   . Diabetes Maternal Grandfather   . Sickle cell trait Mother   . Sickle cell trait Father    Social History  Substance Use Topics  . Smoking status: Former Smoker -- 0.25 packs/day for 1 years    Types: Cigarettes    Quit date: 03/25/2013  .  Smokeless tobacco: Never Used  . Alcohol Use: No     Comment: pt states she quit marijuan in May 2013. Rare ETOH, + cigarettes.  She is enrolled in school   OB History    Gravida Para Term Preterm AB TAB SAB Ectopic Multiple Living   3 1 1  1  1   0 1      Obstetric Comments   Miscarried in October 2012 at about 7 weeks     Review of Systems  Gastrointestinal: Positive for nausea, vomiting and diarrhea.  Musculoskeletal: Positive for myalgias.  All other systems reviewed and are negative.     Allergies  Carrot; Carrot oil; and Latex  Home Medications   Prior to Admission medications   Medication Sig Start Date End Date Taking? Authorizing Provider  ibuprofen (ADVIL,MOTRIN) 800 MG tablet Take 800 mg by mouth 3 (three) times daily as needed. For pain. 07/23/15  Yes Historical Provider, MD  methadone (DOLOPHINE) 5 MG tablet Take 1 tablet (5 mg total) by mouth at bedtime. 08/04/15  Yes Olugbemiga Essie Christine, MD  oxyCODONE (ROXICODONE) 15 MG immediate release tablet Take 1 tablet (15 mg total) by mouth every 4 (four) hours as needed for pain. 08/04/15  Yes Tresa Garter, MD  Prenatal Vit-Min-FA-Fish Oil ([employer] PRENATAL GUMMY) 0.4-113.5 MG CHEW Chew 2-3 each by mouth daily.  Yes Historical Provider, MD   BP 90/49 mmHg  Pulse 101  Temp(Src) 97.5 F (36.4 C) (Oral)  Resp 16  Ht 5\' 6"  (1.676 m)  Wt 180 lb (81.647 kg)  BMI 29.07 kg/m2  SpO2 96%  LMP 02/09/2015 Physical Exam  Constitutional: She is oriented to person, place, and time. She appears well-developed and well-nourished. No distress.  HENT:  Head: Normocephalic and atraumatic.  Right Ear: Hearing normal.  Left Ear: Hearing normal.  Nose: Nose normal.  Mouth/Throat: Oropharynx is clear and moist and mucous membranes are normal.  Eyes: Conjunctivae and EOM are normal. Pupils are equal, round, and reactive to light.  Neck: Normal range of motion. Neck supple.  Cardiovascular: Regular rhythm, S1 normal and S2 normal.   Exam reveals no gallop and no friction rub.   No murmur heard. Pulmonary/Chest: Effort normal and breath sounds normal. No respiratory distress. She exhibits no tenderness.  Abdominal: Soft. Normal appearance and bowel sounds are normal. There is no hepatosplenomegaly. There is no tenderness. There is no rebound, no guarding, no tenderness at McBurney's point and negative Murphy's sign. No hernia.  Musculoskeletal: Normal range of motion.  Neurological: She is alert and oriented to person, place, and time. She has normal strength. No cranial nerve deficit or sensory deficit. Coordination normal. GCS eye subscore is 4. GCS verbal subscore is 5. GCS motor subscore is 6.  Skin: Skin is warm, dry and intact. No rash noted. No cyanosis.  Psychiatric: She has a normal mood and affect. Her speech is normal and behavior is normal. Thought content normal.  Nursing note and vitals reviewed.   ED Course  Procedures (including critical care time) Labs Review Labs Reviewed  COMPREHENSIVE METABOLIC PANEL - Abnormal; Notable for the following:    CO2 21 (*)    Calcium 8.8 (*)    Albumin 3.4 (*)    Total Bilirubin 2.3 (*)    All other components within normal limits  CBC WITH DIFFERENTIAL/PLATELET - Abnormal; Notable for the following:    WBC 19.9 (*)    RBC 2.61 (*)    Hemoglobin 8.4 (*)    HCT 23.9 (*)    RDW 17.7 (*)    Platelets 512 (*)    Neutro Abs 17.2 (*)    Monocytes Absolute 1.2 (*)    All other components within normal limits  URINALYSIS, ROUTINE W REFLEX MICROSCOPIC (NOT AT [facility]) - Abnormal; Notable for the following:    Color, Urine AMBER (*)    APPearance CLOUDY (*)    Leukocytes, UA SMALL (*)    All other components within normal limits  URINE MICROSCOPIC-ADD ON - Abnormal; Notable for the following:    Squamous Epithelial / LPF 0-5 (*)    Bacteria, UA FEW (*)    All other components within normal limits    Imaging Review No results found. I have personally reviewed and  evaluated these images and lab results as part of my medical decision-making.   EKG Interpretation   Date/Time:  Monday August 10 2015 04:30:10 EDT Ventricular Rate:  97 PR Interval:  150 QRS Duration: 91 QT Interval:  359 QTC Calculation: 456 R Axis:   63 Text Interpretation:  Sinus rhythm Normal ECG Confirmed by POLLINA  MD,  CHRISTOPHER [id number]) on 08/10/2015 4:35:14 AM      MDM   Final diagnoses:  None  Sickle cell crisis Vomiting and diarrhea  Patient presents to the ER for evaluation of diffuse pain. Patient has history of sickle  cell disease and feels like she is experiencing a painful crisis. She is not experiencing any shortness of breath. There is no cough or chest pain. She is not hypoxic. Patient is expressing nausea, vomiting and diarrhea. She did have a crisis one week ago associated with the rotavirus gastroenteritis. Nausea, vomiting and diarrhea started again overnight. Because of the GI losses, she was given a small fluid bolus. Patient treated with analgesia, but given smaller doses because she was mildly hypotensive here in the ER and also because she is pregnant. Additionally, she was somewhat somnolent on arrival, having taken pain medicine at home. She has tolerated the pain medicine here in the ER. She is currently undergoing evaluation by OB/GYN as she is 6 months pregnant. Will continue analgesia, fluids, reevaluate after OB/GYN clears her from a pregnancy standpoint.    Orpah Greek, MD 08/10/15 [phone]

## 2015-08-10 NOTE — H&P (Signed)
Hospital Admission Note Date: 08/10/2015  Patient name: [name] Medical record number: [id number] Date of birth: [birth year] Age: 25 y.o. Gender: female PCP: Angelica Chessman, MD  Attending physician: [name], MD  Chief Complaint: Pain all over, vomiting and diarrhea 1 day  History of Present Illness: This is an opiate tolerant patient with Hb SS who was recently discharged from the hospital with a sickle cell crisis. She presents today with a sudden onset of pain all over but she states is consistent with the pain of her sickle cell crisis. She alsois she's been having vomiting and diarrhea since last night. She reports that the pain is "all over". It is throbbing and admittedly sharp in nature. It is nonradiating and not associated with any other symptoms. She specifically identifies the upper back, chest wall, legs and arms. She states that she's had several episodes of emesis Chapman nonbilious and non-hematemesis. She also reports diarrhea with her last stool being about 4 hours ago. She denies any fever, cough, dysuria or sick contacts.  In the emergency room she has received 3 doses of medications over a 9 hour period. She is also received a bolus of a liter of IV fluids without any further fluids. Naturally her pain at this point is at about a 10 out of 10 and I'm asked to admit the patient for acute sickle cell crisis.  Scheduled Meds: . cephALEXin  500 mg Oral Q12H  . enoxaparin (LOVENOX) injection  40 mg Subcutaneous Q24H  . folic acid  2 mg Oral Daily  . HYDROmorphone   Intravenous 6 times per day  .  HYDROmorphone (DILAUDID) injection  2 mg Intravenous Once  . methadone  5 mg Oral QHS  . [START ON 08/11/2015] prenatal vitamin w/FE, FA  1 tablet Oral Q1200  . senna-docusate  1 tablet Oral BID   Continuous Infusions: . sodium chloride     PRN Meds:.diphenhydrAMINE **OR** diphenhydrAMINE (BENADRYL) IVPB(SICKLE CELL ONLY), naloxone **AND** sodium chloride  flush, ondansetron (ZOFRAN) IV, polyethylene glycol Allergies: Carrot; Carrot oil; and Latex Past Medical History  Diagnosis Date  . Miscarriage 03/22/2011    Pt reports 2 miscarriages.  . Depression 01/06/2011  . GERD (gastroesophageal reflux disease) 02/17/2011  . Trichotillomania     h/o  . Blood transfusion     "lots"  . Sickle cell anemia with crisis ([place])   . Exertional dyspnea     "sometimes"  . Migraines 11/08/11    "@ least twice/month"  . Chronic back pain     "very severe; have knot in my back; from tight muscle; take RX and exercise for it"  . Mood swings ([place]) 11/08/11    "I go back and forth; real bad"  . Sickle cell anemia ([name])   . Blood transfusion without reported diagnosis    Past Surgical History  Procedure Laterality Date  . Cholecystectomy  05/2010  . Dilation and curettage of uterus  02/20/11    S/P miscarriage   Family History  Problem Relation Age of Onset  . Diabetes Maternal Grandmother   . Diabetes Paternal Grandmother   . Hypertension Paternal Grandmother   . Diabetes Maternal Grandfather   . Sickle cell trait Mother   . Sickle cell trait Father    Social History   Social History  . Marital Status: Single    Spouse Name: N/A  . Number of Children: N/A  . Years of Education: N/A   Occupational History  . Not on file.  Social History Main Topics  . Smoking status: Former Smoker -- 0.25 packs/day for 1 years    Types: Cigarettes    Quit date: 03/25/2013  . Smokeless tobacco: Never Used  . Alcohol Use: No     Comment: pt states she quit marijuan in May 2013. Rare ETOH, + cigarettes.  She is enrolled in school  . Drug Use: No  . Sexual Activity: Yes    Birth Control/ Protection: None     Comment: last sex Jul 11 2015   Other Topics Concern  . 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  HEENT: Staples/AT PEERL, EOMI Neck: Trachea midline,  no masses, no thyromegal,y no JVD, no carotid bruit OROPHARYNX:  Moist, No exudate/ erythema/lesions.  Heart: Regular rate and rhythm, without murmurs, rubs, gallops, PMI non-displaced, no heaves or thrills on palpation.  Lungs: Clear to auscultation, no wheezing or rhonchi noted. No increased vocal fremitus resonant to percussion.  Abdomen: Gravid abdomen. Neuro: No focal neurological deficits noted cranial nerves II through XII grossly intact.  Strength at functional baseline in bilateral upper and lower extremities. Musculoskeletal: No warm swelling or erythema around joints, no spinal tenderness noted. Psychiatric: Patient alert and oriented x3, good insight and cognition, good recent to remote recall. Lymph node survey: No cervical axillary or inguinal lymphadenopathy noted.  Lab results:  Recent Labs  08/10/15 0519  NA 141  K 3.8  CL 110  CO2 21*  GLUCOSE 87  BUN 8  CREATININE 0.45  CALCIUM 8.8*    Recent Labs  08/10/15 0519  AST 22  ALT 23  ALKPHOS 63  BILITOT 2.3*  PROT 6.7  ALBUMIN 3.4*   No results for input(s): LIPASE, AMYLASE in the last 72 hours.  Recent Labs  08/10/15 0519  WBC 19.9*  NEUTROABS 17.2*  HGB 8.4*  HCT 23.9*  MCV 91.6  PLT 512*   No results for input(s): CKTOTAL, CKMB, CKMBINDEX, TROPONINI in the last 72 hours. Invalid input(s): POCBNP No results for input(s): DDIMER in the last 72 hours. No results for input(s): HGBA1C in the last 72 hours. No results for input(s): CHOL, HDL, LDLCALC, TRIG, CHOLHDL, LDLDIRECT in the last 72 hours. No results for input(s): TSH, T4TOTAL, T3FREE, THYROIDAB in the last 72 hours.  Invalid input(s): FREET3 No results for input(s): VITAMINB12, FOLATE,  FERRITIN, TIBC, IRON, RETICCTPCT in the last 72 hours. Imaging results:  [place] Mfm Ob Detail +14 Wk  07/15/2015  OBSTETRICAL ULTRASOUND: This exam was performed within a [facility]. The OB US report was generated in the AS system, and faxed to the ordering physician.  This report is available in the [facility]. See the [facility] Obstetric US report via the Image Link.  Assessment and Plan: 1. HBSS with crisis: Patient currently describing pain is 10 out of 10. Her blood pressures are little bit on the low side and she has not received anything for pain for several hours. I will start her on a PCA and give her a single dose of Dilaudid 1 mg IV push. He is unable to receive Toradol due  to her pregnancy. Due to her state of the hydration I will give her 0.9 normal saline at a rate of 125 mL/hour. Will reassess blood pressures and effectiveness of pain regiment in a few hours. 2. Dehydration: This is likely secondary to her vomiting and diarrhea. Will rehydrate with normal saline. 3. Pyuria: In a pregnant patient this could represent a urinary tract infection. Thus I will treat empirically with Keflex and send urine for culture. 4. Vomiting and diarrhea: Pt was recently diagnosed with Norovirus and had a resolution of her symptoms. She reports a recurrence of diarrhea starting last night. Will monitor for continued diarrhea and if increased frequency will check for c-diff. She does have pyuria and this could be a reflection of UTI causing these symptoms. Will send urine for culture.  5. Hypotension: The patient likely has some degree of hypotension secondary to her pregnancy however she appears clinically dehydrated and I will give IV fluids 0.9 normal saline. 6. Leukocytosis: Currently the patient does not have any overt evidence of an infection. She does have diarrhea and has recently been diagnosed with [place] virus. She could have a GI or urinary tract infection. In the absence of a fever this  could also be just reactive to her sickle cell crisis. I will continue to monitor her white blood cell count. 7. Third trimester intrauterine pregnancy: She has been seen by the OB rapid response nurses who are in extension of the OB service. We'll place a consult for them so that they can monitor fetal viability a daily basis.  Time spent 1 hour  Aysia Lowder A. 08/10/2015, 2:32 PM

## 2015-08-10 NOTE — ED Notes (Signed)
Pt refusing to dress in hospital gown until in the room upstairs.

## 2015-08-10 NOTE — ED Notes (Signed)
Pt fell asleep twice during conversation with this nurse; pt appears drowsy and was difficult to arouse.

## 2015-08-10 NOTE — ED Notes (Signed)
RN Autumn attempting IV

## 2015-08-11 ENCOUNTER — Ambulatory Visit: Payer: Medicare Other | Admitting: Internal Medicine

## 2015-08-11 ENCOUNTER — Inpatient Hospital Stay (HOSPITAL_COMMUNITY): Payer: Medicare Other

## 2015-08-11 DIAGNOSIS — I9589 Other hypotension: Secondary | ICD-10-CM | POA: Diagnosis not present

## 2015-08-11 DIAGNOSIS — D5700 Hb-SS disease with crisis, unspecified: Secondary | ICD-10-CM | POA: Diagnosis not present

## 2015-08-11 DIAGNOSIS — O99013 Anemia complicating pregnancy, third trimester: Secondary | ICD-10-CM | POA: Diagnosis not present

## 2015-08-11 DIAGNOSIS — Z3A00 Weeks of gestation of pregnancy not specified: Secondary | ICD-10-CM | POA: Diagnosis not present

## 2015-08-11 DIAGNOSIS — D638 Anemia in other chronic diseases classified elsewhere: Secondary | ICD-10-CM | POA: Diagnosis not present

## 2015-08-11 DIAGNOSIS — E860 Dehydration: Secondary | ICD-10-CM | POA: Diagnosis not present

## 2015-08-11 LAB — BASIC METABOLIC PANEL
ANION GAP: 8 (ref 5–15)
BUN: 6 mg/dL (ref 6–20)
CHLORIDE: 111 mmol/L (ref 101–111)
CO2: 21 mmol/L — AB (ref 22–32)
Calcium: 8.4 mg/dL — ABNORMAL LOW (ref 8.9–10.3)
Creatinine, Ser: 0.44 mg/dL (ref 0.44–1.00)
GFR calc Af Amer: >60 mL/min (ref >60)
GLUCOSE: 94 mg/dL (ref 65–99)
POTASSIUM: 3.8 mmol/L (ref 3.5–5.1)
Sodium: 140 mmol/L (ref 135–145)

## 2015-08-11 LAB — CBC WITH DIFFERENTIAL/PLATELET
Basophils Absolute: 0 10*3/uL (ref 0.0–0.1)
Basophils Relative: 0 %
Eosinophils Absolute: 0.3 10*3/uL (ref 0.0–0.7)
Eosinophils Relative: 2 %
HEMATOCRIT: 22.4 % — AB (ref 36.0–46.0)
Hemoglobin: 7.6 g/dL — ABNORMAL LOW (ref 12.0–15.0)
LYMPHS PCT: 14 %
Lymphs Abs: 2.4 10*3/uL (ref 0.7–4.0)
MCH: 31 pg (ref 26.0–34.0)
MCHC: 33.9 g/dL (ref 30.0–36.0)
MCV: 91.4 fL (ref 78.0–100.0)
MONO ABS: 1.3 10*3/uL — AB (ref 0.1–1.0)
MONOS PCT: 8 %
NEUTROS ABS: 12.7 10*3/uL — AB (ref 1.7–7.7)
Neutrophils Relative %: 76 %
Platelets: 452 10*3/uL — ABNORMAL HIGH (ref 150–400)
RBC: 2.45 MIL/uL — ABNORMAL LOW (ref 3.87–5.11)
RDW: 17.5 % — AB (ref 11.5–15.5)
WBC: 16.7 10*3/uL — ABNORMAL HIGH (ref 4.0–10.5)

## 2015-08-11 LAB — RETICULOCYTES
RBC.: 2.45 MIL/uL — ABNORMAL LOW (ref 3.87–5.11)
Retic Count, Absolute: 494.9 10*3/uL — ABNORMAL HIGH (ref 19.0–186.0)
Retic Ct Pct: 20.2 % — ABNORMAL HIGH (ref 0.4–3.1)

## 2015-08-11 MED ORDER — HYDROMORPHONE HCL 2 MG/ML IJ SOLN
2.0000 mg | INTRAMUSCULAR | Status: AC
  Administered 2015-08-11 (×3): 2 mg via INTRAVENOUS
  Filled 2015-08-11 (×3): qty 1

## 2015-08-11 MED ORDER — HYDROMORPHONE HCL 2 MG/ML IJ SOLN
2.0000 mg | INTRAMUSCULAR | Status: DC | PRN
  Administered 2015-08-11 – 2015-08-15 (×35): 2 mg via INTRAVENOUS
  Filled 2015-08-11 (×36): qty 1

## 2015-08-11 MED ORDER — HYDROMORPHONE 1 MG/ML IV SOLN
INTRAVENOUS | Status: DC
  Administered 2015-08-11: 7.24 mg via INTRAVENOUS
  Administered 2015-08-11: 6.38 mg via INTRAVENOUS
  Administered 2015-08-12: 5.61 mg via INTRAVENOUS
  Administered 2015-08-12: 5.6 mg via INTRAVENOUS
  Administered 2015-08-12: 16:00:00 via INTRAVENOUS
  Administered 2015-08-12: 6.6 mg via INTRAVENOUS
  Administered 2015-08-12: 9.28 mg via INTRAVENOUS
  Administered 2015-08-13: via INTRAVENOUS
  Administered 2015-08-13: 4.7 mg via INTRAVENOUS
  Administered 2015-08-13: 6.82 mg via INTRAVENOUS
  Administered 2015-08-13: 5.71 mg via INTRAVENOUS
  Administered 2015-08-13: 4.69 mg via INTRAVENOUS
  Administered 2015-08-13: 6.4 mg via INTRAVENOUS
  Administered 2015-08-13: 11:00:00 via INTRAVENOUS
  Administered 2015-08-13: 3.63 mg via INTRAVENOUS
  Administered 2015-08-14: 18:00:00 via INTRAVENOUS
  Administered 2015-08-14: 7.29 mg via INTRAVENOUS
  Administered 2015-08-14: 6.4 mg via INTRAVENOUS
  Administered 2015-08-14: 9.1 mg via INTRAVENOUS
  Administered 2015-08-14: 2.52 mg via INTRAVENOUS
  Administered 2015-08-14: 5.49 mg via INTRAVENOUS
  Administered 2015-08-15: 11:00:00 via INTRAVENOUS
  Administered 2015-08-15: 5.75 mg via INTRAVENOUS
  Administered 2015-08-15: 6.2 mg via INTRAVENOUS
  Administered 2015-08-15: 6.07 mg via INTRAVENOUS
  Administered 2015-08-15: 10.67 mg via INTRAVENOUS
  Administered 2015-08-15: 3.8 mg via INTRAVENOUS
  Administered 2015-08-16: 25 mg via INTRAVENOUS
  Filled 2015-08-11 (×8): qty 25

## 2015-08-11 MED ORDER — PRENATAL MULTIVITAMIN CH
1.0000 | ORAL_TABLET | Freq: Every day | ORAL | Status: DC
  Administered 2015-08-11 – 2015-08-16 (×6): 1 via ORAL
  Filled 2015-08-11 (×7): qty 1

## 2015-08-11 MED ORDER — TETANUS-DIPHTH-ACELL PERTUSSIS 5-2.5-18.5 LF-MCG/0.5 IM SUSP
0.5000 mL | Freq: Once | INTRAMUSCULAR | Status: AC
  Administered 2015-08-11: 0.5 mL via INTRAMUSCULAR
  Filled 2015-08-11: qty 0.5

## 2015-08-11 NOTE — Progress Notes (Signed)
1620  Here to evaluate this 25 yo G3P1 @27 .[redacted] wks GA inpatient with sickle cell crisis.  Patient denies vaginal bleeding or leaking of fluid and contractions.  Reports frequent fetal movement. 1700 Patient is very animated today, talking loudly on the phone and moving around during fetal monitoring.  RN holding cardio on the whole time.  FHR reassuring for gestational age. Occasionally tracing maternal due to patient and fetal movements.  Frequent fetal movements palpated.  No contractions noted or reported by patient.  7  Dr. Kennon Rounds updated for today on patient and of above.  The medical record is reviewed by Dr. Kennon Rounds while we are on the phone.  Orders for 28 wk labs-glucose tolerance test, rpr, hiv ordered.  Tdap also ordered.

## 2015-08-11 NOTE — Care Management Note (Signed)
Case Management Note  Patient Details  Name: [name] MRN: [id number] Date of Birth: 04-01-1991  Subjective/Objective:         25 yo admitted with SCC. She is 6 months pregnant.           Action/Plan: From home alone.  Expected Discharge Date:  08/14/15               Expected Discharge Plan:  Home/Self Care  In-House Referral:     Discharge planning Services  CM Consult  Post Acute Care Choice:    Choice offered to:     DME Arranged:    DME Agency:     HH Arranged:    HH Agency:     Status of Service:  In process, will continue to follow  Medicare Important Message Given:    Date Medicare IM Given:    Medicare IM give by:    Date Additional Medicare IM Given:    Additional Medicare Important Message give by:     If discussed at [place] of Stay Meetings, dates discussed:    Additional Comments: Chart reviewed and no CM needs identified or communicated at this time. CM will continue to follow. Marney Doctor RN,BSN,NCM [id number] Lynnell Catalan, RN 08/11/2015, 10:40 AM

## 2015-08-11 NOTE — Consult Note (Signed)
   [facility] CM Inpatient Consult   08/11/2015  RAYCHELLE WHITEHILL 1990-06-05 [id number]     Spoke with patient at bedside about [place] Management program. Had a long lengthy conversation with patient. She was receptive with [facility] follow up and is agreeable to monthly home visits if needed. Confirmed Primary Care MD is with [facility]. She also endorses she goes to [place]. Reports she uses Medicaid transportation and is in the market for looking for a car soon and to move to a 2 bedroom apartment soon. She is currently 7 months pregnant and has an 58 month old at home. She states her primary focus is now her children who depend on her. Ms Garr indicates she plans on getting on track. States using cocaine was very stupid and she has since moved past that and it will never do it again. Ms. Carnley was pleasant and agreeable. Written consent obtained. She reports she has received at [facility] flier and magnet in the mail so she was somewhat familiar with the program. [facility] packet and contact information left at bedside. Will make inpatient RNCM aware that [facility] will follow up post discharge. Confirmed best contact number as [phone].   Marthenia Rolling, MSN-Ed, RN,BSN [facility] Liaison [phone]

## 2015-08-11 NOTE — Progress Notes (Signed)
SICKLE CELL SERVICE PROGRESS NOTE  [name] [id number] DOB: 1990-10-31 DOA: 08/10/2015 PCP: Angelica Chessman, MD  Assessment/Plan: Active Problems:   Hb-SS disease with crisis ([place])   Sickle cell pain crisis ([place])   Sickle cell anemia ([place])  1. Hb SS with crisis: Pain in bilateral upper shoulder area at an intensity of 10 out of 10. Patient is currently crying and writhing in pain. I will add a basal rate to her current PCA rate and give her clinician assisted doses on an as-needed basis for breakthrough pain. The absence of NSAID's due to pregnancy in treatment of pain makes decrease in pain slower. 2. Leukocytosis: No focus of infection. This may be related to inflammation in crisis which cannot be treated due to pregnancy.   3. Diarrhea: Pt reports that she's had 3 diarrheal stools the last being this morning. Since then she's had no further stool. Orders are placed for stool to be sent for C. difficile. Please note that the patient had no rotavirus on her last admission which is typically self-limited. 4. 2nd Trimester Pregnancy: Evaluated by Ob-rapid response team. 5. Anemia of Chronic Disease: Hemoglobin stable at 7.6.     Code Status: Full Code Family Communication: N/A Disposition Plan: Not yet ready for discharge  [place].  Pager [phone]. If 7PM-7AM, please contact night-coverage.  08/11/2015, 4:03 PM  LOS: 1 day  Interim History: Pt crying in pain that is now localized to jaw and RUE. The pain in her RLE has decreased.   Consultants:  Ob-Gyn  Procedures:  None  Antibiotics:  None   Objective: Filed Vitals:   08/11/15 0747 08/11/15 1010 08/11/15 1217 08/11/15 1449  BP:  94/40  104/49  Pulse:  101  90  Temp:  98.5 F (36.9 C)  98.2 F (36.8 C)  TempSrc:  Oral  Oral  Resp: 13 12 19 12   Height:      Weight:      SpO2: 91% 91% 96% 95%   Weight change:   Intake/Output Summary (Last 24 hours) at 08/11/15 1603 Last data filed at  08/11/15 1449  Gross per 24 hour  Intake    720 ml  Output   1400 ml  Net   -680 ml    General: Alert, awake, oriented x3, in moderate distress due to pain.  HEENT: [place]/AT PEERL, EOMI, anicteric. Neck: Trachea midline,  no masses, no thyromegal,y no JVD, no carotid bruit OROPHARYNX:  Moist, No exudate/ erythema/lesions.  Heart: Regular rate and rhythm, without murmurs, rubs, gallops, PMI non-displaced, no heaves or thrills on palpation.  Lungs: Clear to auscultation, no wheezing or rhonchi noted. No increased vocal fremitus resonant to percussion  Abdomen: Gravid abdomen Neuro: No focal neurological deficits noted cranial nerves II through XII grossly intact.  Strength at functional baseline in bilateral upper and lower extremities. Musculoskeletal: No warm swelling or erythema around joints, no spinal tenderness noted. Psychiatric: Patient alert and oriented x3, good insight and cognition, good recent to remote recall.    Data Reviewed: Basic Metabolic Panel:  Recent Labs Lab 08/10/15 0519 08/11/15 0351  NA 141 140  K 3.8 3.8  CL 110 111  CO2 21* 21*  GLUCOSE 87 94  BUN 8 6  CREATININE 0.45 0.44  CALCIUM 8.8* 8.4*   Liver Function Tests:  Recent Labs Lab 08/10/15 0519  AST 22  ALT 23  ALKPHOS 63  BILITOT 2.3*  PROT 6.7  ALBUMIN 3.4*   No results for input(s): LIPASE, AMYLASE  in the last 168 hours. No results for input(s): AMMONIA in the last 168 hours. CBC:  Recent Labs Lab 08/10/15 0519 08/11/15 0351  WBC 19.9* 16.7*  NEUTROABS 17.2* 12.7*  HGB 8.4* 7.6*  HCT 23.9* 22.4*  MCV 91.6 91.4  PLT 512* 452*   Cardiac Enzymes: No results for input(s): CKTOTAL, CKMB, CKMBINDEX, TROPONINI in the last 168 hours. BNP (last 3 results) No results for input(s): BNP in the last 8760 hours.  ProBNP (last 3 results) No results for input(s): PROBNP in the last 8760 hours.  CBG: No results for input(s): GLUCAP in the last 168 hours.  No results found for this or  any previous visit (from the past 240 hour(s)).   Studies: Dg Chest 2 View  08/11/2015  CLINICAL DATA:  Hypoxia. EXAM: CHEST  2 VIEW COMPARISON:  10/25/2014. FINDINGS: No focal infiltrate. Stable cardiomegaly, no pulmonary venous congestion. Right upper lobe pleural parenchymal thickening noted consistent with scarring. No interim change. Stable calcified pulmonary nodules consistent granulomas. No pleural effusion or pneumothorax. No acute bony abnormality . IMPRESSION: No acute cardiopulmonary disease. Stable cardiomegaly. Stable changes of right upper lobe pleural parenchymal scarring and calcified pulmonary nodules consistent with granulomas. Electronically Signed   By: Marcello Moores  Register   On: 08/11/2015 11:07   [place] Mfm Ob Detail +14 Wk  07/15/2015  OBSTETRICAL ULTRASOUND: This exam was performed within a [place] [facility] Department. The OB US report was generated in the AS system, and faxed to the ordering physician.  This report is available in the [facility]. See the AS Obstetric US report via the Image Link.   Scheduled Meds: . cephALEXin  500 mg Oral Q12H  . enoxaparin (LOVENOX) injection  40 mg Subcutaneous Q24H  . folic acid  2 mg Oral Daily  . HYDROmorphone   Intravenous 6 times per day  . methadone  5 mg Oral QHS  . prenatal multivitamin  1 tablet Oral Q1200   Continuous Infusions: . sodium chloride 125 mL/hr at 08/11/15 1226    Time spent 25 minutes.

## 2015-08-12 ENCOUNTER — Ambulatory Visit (HOSPITAL_COMMUNITY): Payer: Medicare Other

## 2015-08-12 ENCOUNTER — Encounter: Payer: Self-pay | Admitting: Obstetrics & Gynecology

## 2015-08-12 DIAGNOSIS — O0990 Supervision of high risk pregnancy, unspecified, unspecified trimester: Secondary | ICD-10-CM | POA: Insufficient documentation

## 2015-08-12 DIAGNOSIS — Z331 Pregnant state, incidental: Secondary | ICD-10-CM

## 2015-08-12 LAB — RPR: RPR Ser Ql: NONREACTIVE

## 2015-08-12 LAB — URINE CULTURE: Culture: 5000

## 2015-08-12 LAB — GLUCOSE TOLERANCE, 1 HOUR: GLUCOSE 1 HOUR GTT: 84 mg/dL (ref 70–140)

## 2015-08-12 LAB — GLUCOSE, BASELINE (LACTOSE TOLERANCE TEST): Glucose: 80 mg/dL (ref 70–115)

## 2015-08-12 LAB — HIV ANTIBODY (ROUTINE TESTING W REFLEX): HIV SCREEN 4TH GENERATION: NONREACTIVE

## 2015-08-12 NOTE — Progress Notes (Signed)
SICKLE CELL SERVICE PROGRESS NOTE  [name] [id number] DOB: [birth year] DOA: 08/10/2015 PCP: Angelica Chessman, MD  Assessment/Plan: Active Problems:   Hb-SS disease with crisis ([name])   Sickle cell pain crisis ([name])   Sickle cell anemia ([name])  1. Hb SS with crisis: P I will continue current  PCA settings with a basal rate and continue her clinician assisted doses on an as-needed basis for breakthrough pain. The absence of NSAID's due to pregnancy in treatment of pain makes decrease in pain slower. 2. Leukocytosis: No focus of infection. This may be related to inflammation in crisis which cannot be treated due to pregnancy.   3. Diarrhea: Since yesterday she has had no further stool. Will discontinue isolation and stool evaluation for c.diff. 4. 2nd Trimester Pregnancy: Evaluated by Ob-rapid response team. 5. Anemia of Chronic Disease: Hemoglobin stable at 7.6. I have reviewed her hemoglobin electrophoresis from 3/21 and % Hb S is still high at 74%. Will transfuse 1 unit RBC to decrease % Hb S.    Code Status: Full Code Family Communication: N/A Disposition Plan: Not yet ready for discharge  [place].  Pager [phone]. If 7PM-7AM, please contact night-coverage.  08/12/2015, 2:45 PM  LOS: 2 days  Interim History: Pt reports that her pain is down to a 8/10 from 10/10 and is now intermittent but still throbbing and sharp in nature.   Consultants:  Ob-Gyn  Procedures:  None  Antibiotics:  None   Objective: Filed Vitals:   08/12/15 0630 08/12/15 0800 08/12/15 0938 08/12/15 1242  BP: 111/69  113/65   Pulse: 101  97   Temp: 98.3 F (36.8 C)  98 F (36.7 C)   TempSrc: Oral  Oral   Resp: 16 16 17 11   Height:      Weight: 175 lb 14.8 oz (79.8 kg)     SpO2: 98% 96% 99% 95%   Weight change:   Intake/Output Summary (Last 24 hours) at 08/12/15 1445 Last data filed at 08/12/15 0602  Gross per 24 hour  Intake    120 ml  Output   1400 ml  Net  -1280  ml    General: Alert, awake, oriented x3, in moderate distress due to pain.  HEENT: [place]/AT PEERL, EOMI, anicteric. Neck: Trachea midline,  no masses, no thyromegal,y no JVD, no carotid bruit OROPHARYNX:  Moist, No exudate/ erythema/lesions.  Heart: Regular rate and rhythm, without murmurs, rubs, gallops, PMI non-displaced, no heaves or thrills on palpation.  Lungs: Clear to auscultation, no wheezing or rhonchi noted. No increased vocal fremitus resonant to percussion  Abdomen: Gravid abdomen Neuro: No focal neurological deficits noted cranial nerves II through XII grossly intact.  Strength at functional baseline in bilateral upper and lower extremities. Musculoskeletal: No warm swelling or erythema around joints, no spinal tenderness noted. Psychiatric: Patient alert and oriented x3, good insight and cognition, good recent to remote recall.    Data Reviewed: Basic Metabolic Panel:  Recent Labs Lab 08/10/15 0519 08/11/15 0351 08/12/15 0605  NA 141 140  --   K 3.8 3.8  --   CL 110 111  --   CO2 21* 21*  --   GLUCOSE 87 94 80  BUN 8 6  --   CREATININE 0.45 0.44  --   CALCIUM 8.8* 8.4*  --    Liver Function Tests:  Recent Labs Lab 08/10/15 0519  AST 22  ALT 23  ALKPHOS 63  BILITOT 2.3*  PROT 6.7  ALBUMIN 3.4*  No results for input(s): LIPASE, AMYLASE in the last 168 hours. No results for input(s): AMMONIA in the last 168 hours. CBC:  Recent Labs Lab 08/10/15 0519 08/11/15 0351  WBC 19.9* 16.7*  NEUTROABS 17.2* 12.7*  HGB 8.4* 7.6*  HCT 23.9* 22.4*  MCV 91.6 91.4  PLT 512* 452*   Cardiac Enzymes: No results for input(s): CKTOTAL, CKMB, CKMBINDEX, TROPONINI in the last 168 hours. BNP (last 3 results) No results for input(s): BNP in the last 8760 hours.  ProBNP (last 3 results) No results for input(s): PROBNP in the last 8760 hours.  CBG: No results for input(s): GLUCAP in the last 168 hours.  Recent Results (from the past 240 hour(s))  Urine culture      Status: None   Collection Time: 08/10/15 11:57 PM  Result Value Ref Range Status   Specimen Description URINE, CLEAN CATCH  Final   Special Requests NONE  Final   Culture   Final    5,000 COLONIES/mL INSIGNIFICANT GROWTH Performed at [facility]    Report Status 08/12/2015 FINAL  Final     Studies: Dg Chest 2 View  08/11/2015  CLINICAL DATA:  Hypoxia. EXAM: CHEST  2 VIEW COMPARISON:  10/25/2014. FINDINGS: No focal infiltrate. Stable cardiomegaly, no pulmonary venous congestion. Right upper lobe pleural parenchymal thickening noted consistent with scarring. No interim change. Stable calcified pulmonary nodules consistent granulomas. No pleural effusion or pneumothorax. No acute bony abnormality . IMPRESSION: No acute cardiopulmonary disease. Stable cardiomegaly. Stable changes of right upper lobe pleural parenchymal scarring and calcified pulmonary nodules consistent with granulomas. Electronically Signed   By: Marcello Moores  Register   On: 08/11/2015 11:07   [place] Mfm Ob Detail +14 Wk  07/15/2015  OBSTETRICAL ULTRASOUND: This exam was performed within a [place] Ultrasound Department. The OB US report was generated in the AS system, and faxed to the ordering physician.  This report is available in the [facility]. See the AS Obstetric US report via the Image Link.   Scheduled Meds: . cephALEXin  500 mg Oral Q12H  . enoxaparin (LOVENOX) injection  40 mg Subcutaneous Q24H  . folic acid  2 mg Oral Daily  . HYDROmorphone   Intravenous 6 times per day  . methadone  5 mg Oral QHS  . prenatal multivitamin  1 tablet Oral Q1200   Continuous Infusions: . sodium chloride 1,000 mL (08/12/15 0331)    Time spent 25 minutes.

## 2015-08-12 NOTE — Progress Notes (Signed)
Patient ID: [name], female   DOB: Nov 03, 1990, 25 y.o.   MRN: [id number] Pt has not had any further stools and she has no fevers. Her elevated WBC is easily attributable to her current sickle Cell Crisis. I have discontinued enteric precautions.

## 2015-08-12 NOTE — Progress Notes (Signed)
Spoke with Dr. Ihor Dow. She reviewed the chart while I spoke with her. Pt denies vaginal bleeding or leaking of fluid. No uc's noted on EFM, none felt by palpation. Pos fetal movement. Category1 tracing. Pt seems to be in good spirits today. Talking on her phone, talking with me. No further orders received. Pt did drink her glucola.

## 2015-08-13 ENCOUNTER — Inpatient Hospital Stay (HOSPITAL_COMMUNITY): Payer: Medicare Other

## 2015-08-13 LAB — PREPARE RBC (CROSSMATCH)

## 2015-08-13 MED ORDER — LIDOCAINE HCL 1 % IJ SOLN
INTRAMUSCULAR | Status: AC
  Filled 2015-08-13: qty 20

## 2015-08-13 MED ORDER — SODIUM CHLORIDE 0.9% FLUSH
10.0000 mL | INTRAVENOUS | Status: DC | PRN
  Administered 2015-08-15: 10 mL
  Filled 2015-08-13: qty 40

## 2015-08-13 MED ORDER — SODIUM CHLORIDE 0.9% FLUSH
10.0000 mL | Freq: Two times a day (BID) | INTRAVENOUS | Status: DC
  Administered 2015-08-13: 20 mL
  Administered 2015-08-14 – 2015-08-17 (×6): 10 mL

## 2015-08-13 MED ORDER — SODIUM CHLORIDE 0.9 % IV SOLN: Freq: Once | INTRAVENOUS | Status: DC

## 2015-08-13 NOTE — Procedures (Signed)
Interventional Radiology Procedure Note  Procedure: [place] tof  aLEFT arm dual-lumen PowerPICC.  Tip in SVC and ready for use.   Complications: None  Estimated Blood Loss:  0  Recommendations:  - Routine line care  Signed,  Criselda Peaches, MD

## 2015-08-13 NOTE — Progress Notes (Signed)
Called Dr Monia Pouch, updated on pt status, denies leaking fluid or bleeding, pt states feeling baby move, positive fetal  movement heard while monitoring. Informed of FHR tracing, no new orders at this time.

## 2015-08-13 NOTE — Progress Notes (Signed)
SICKLE CELL SERVICE PROGRESS NOTE  [name] [id number] DOB: 1990/10/03 DOA: 08/10/2015 PCP: Angelica Chessman, MD  Assessment/Plan: Active Problems:   Hb-SS disease with crisis ([place])   Sickle cell pain crisis ([place])   Sickle cell anemia ([place])  1. Hb SS with crisis: Appears to be doing better but pain is persistent.  She will get exchange transfusion today.  I will keep her on the Dilaudid PCA current dose until pain level drops. The absence of NSAID's due to pregnancy in treatment of pain makes decrease in pain slower. 2. Leukocytosis: Improved. No focus of infection. This may be related to inflammation in crisis which cannot be treated due to pregnancy.   3. Diarrhea: This is resolved.  4. 2nd Trimester Pregnancy: Evaluated by Ob-rapid response team. 5. Anemia of Chronic Disease: Patient's hemoglobin electrophoresis from 3/21 and % Hb S is still high at 74%. Will order extra transfusion to decrease her hemoglobin levels.  This would help with her oxygen carrying capacity.    Code Status: Full Code Family Communication: N/A Disposition Plan: Not yet ready for discharge  [facility]  Pager [phone]. If 7PM-7AM, please contact night-coverage.  08/13/2015, 10:35 PM  LOS: 3 days  Interim History: Pt reports that her pain is down to a 8/10 from 10/10 and is now intermittent but still throbbing and sharp in nature.   Consultants:  Ob-Gyn  Procedures:  None  Antibiotics:  None   Objective: Filed Vitals:   08/13/15 1957 08/13/15 2028 08/13/15 2030 08/13/15 2232  BP: 116/54  118/56 89/58  Pulse: 91  95 96  Temp: 97.5 F (36.4 C)  98.2 F (36.8 C) 98.4 F (36.9 C)  TempSrc: Oral  Oral Oral  Resp: 16 18 18 16   Height:      Weight:      SpO2: 94% 95% 95% 95%   Weight change:   Intake/Output Summary (Last 24 hours) at 08/13/15 2235 Last data filed at 08/13/15 2220  Gross per 24 hour  Intake   2370 ml  Output   4100 ml  Net  -1730 ml    General: Alert,  awake, oriented x3, in moderate distress due to pain.  HEENT: [place]/AT PEERL, EOMI, anicteric. Neck: Trachea midline,  no masses, no thyromegal,y no JVD, no carotid bruit OROPHARYNX:  Moist, No exudate/ erythema/lesions.  Heart: Regular rate and rhythm, without murmurs, rubs, gallops, PMI non-displaced, no heaves or thrills on palpation.  Lungs: Clear to auscultation, no wheezing or rhonchi noted. No increased vocal fremitus resonant to percussion  Abdomen: Gravid abdomen Neuro: No focal neurological deficits noted cranial nerves II through XII grossly intact.  Strength at functional baseline in bilateral upper and lower extremities. Musculoskeletal: No warm swelling or erythema around joints, no spinal tenderness noted. Psychiatric: Patient alert and oriented x3, good insight and cognition, good recent to remote recall.    Data Reviewed: Basic Metabolic Panel:  Recent Labs Lab 08/10/15 0519 08/11/15 0351 08/12/15 0605  NA 141 140  --   K 3.8 3.8  --   CL 110 111  --   CO2 21* 21*  --   GLUCOSE 87 94 80  BUN 8 6  --   CREATININE 0.45 0.44  --   CALCIUM 8.8* 8.4*  --    Liver Function Tests:  Recent Labs Lab 08/10/15 0519  AST 22  ALT 23  ALKPHOS 63  BILITOT 2.3*  PROT 6.7  ALBUMIN 3.4*   No results for input(s): LIPASE, AMYLASE in the  last 168 hours. No results for input(s): AMMONIA in the last 168 hours. CBC:  Recent Labs Lab 08/10/15 0519 08/11/15 0351  WBC 19.9* 16.7*  NEUTROABS 17.2* 12.7*  HGB 8.4* 7.6*  HCT 23.9* 22.4*  MCV 91.6 91.4  PLT 512* 452*   Cardiac Enzymes: No results for input(s): CKTOTAL, CKMB, CKMBINDEX, TROPONINI in the last 168 hours. BNP (last 3 results) No results for input(s): BNP in the last 8760 hours.  ProBNP (last 3 results) No results for input(s): PROBNP in the last 8760 hours.  CBG: No results for input(s): GLUCAP in the last 168 hours.  Recent Results (from the past 240 hour(s))  Urine culture     Status: None    Collection Time: 08/10/15 11:57 PM  Result Value Ref Range Status   Specimen Description URINE, CLEAN CATCH  Final   Special Requests NONE  Final   Culture   Final    5,000 COLONIES/mL INSIGNIFICANT GROWTH Performed at [facility]    Report Status 08/12/2015 FINAL  Final     Studies: Dg Chest 2 View  08/11/2015  CLINICAL DATA:  Hypoxia. EXAM: CHEST  2 VIEW COMPARISON:  10/25/2014. FINDINGS: No focal infiltrate. Stable cardiomegaly, no pulmonary venous congestion. Right upper lobe pleural parenchymal thickening noted consistent with scarring. No interim change. Stable calcified pulmonary nodules consistent granulomas. No pleural effusion or pneumothorax. No acute bony abnormality . IMPRESSION: No acute cardiopulmonary disease. Stable cardiomegaly. Stable changes of right upper lobe pleural parenchymal scarring and calcified pulmonary nodules consistent with granulomas. Electronically Signed   By: Marcello Moores  Register   On: 08/11/2015 11:07   Ir Fluoro Guide Cv Line Left  08/13/2015  INDICATION: 25 year old pregnant female with sickle cell disease currently and sickle cell crisis. She requires venous access for hydration and pain control. Prior attempts at right-sided PICC placement or unsuccessful. Left-sided placement will be attempted. EXAM: PICC LINE PLACEMENT WITH ULTRASOUND AND FLUOROSCOPIC GUIDANCE MEDICATIONS: None required ANESTHESIA/SEDATION: None FLUOROSCOPY TIME:  Fluoroscopy Time: 1 minutes 36 seconds (19 mGy). COMPLICATIONS: None immediate. PROCEDURE: The patient was advised of the possible risks and complications and agreed to undergo the procedure. The patient was then brought to the angiographic suite for the procedure. The left arm was prepped with chlorhexidine, draped in the usual sterile fashion using maximum barrier technique (cap and mask, sterile gown, sterile gloves, large sterile sheet, hand hygiene and cutaneous antisepsis) and infiltrated locally with 1% Lidocaine.  Ultrasound demonstrated patency of the left brachial vein, and this was documented with an image. Under real-time ultrasound guidance, this vein was accessed with a 21 gauge micropuncture needle and image documentation was performed. A 0.018 wire was introduced in to the vein. Over this, a 6 [place] dual lumen power injectable PICC was advanced to the lower SVC/right atrial junction. Fluoroscopy during the procedure and fluoro spot radiograph confirms appropriate catheter position. The catheter was flushed and covered with a sterile dressing. Catheter length: 40 cm IMPRESSION: Successful left arm power PICC line placement with ultrasound and fluoroscopic guidance. The catheter is ready for use. Electronically Signed   By: Jacqulynn Cadet M.D.   On: 08/13/2015 17:11   Ir US Guide Vasc Access Left  08/13/2015  INDICATION: 25 year old pregnant female with sickle cell disease currently and sickle cell crisis. She requires venous access for hydration and pain control. Prior attempts at right-sided PICC placement or unsuccessful. Left-sided placement will be attempted. EXAM: PICC LINE PLACEMENT WITH ULTRASOUND AND FLUOROSCOPIC GUIDANCE MEDICATIONS: None required ANESTHESIA/SEDATION: None FLUOROSCOPY  TIME:  Fluoroscopy Time: 1 minutes 36 seconds (19 mGy). COMPLICATIONS: None immediate. PROCEDURE: The patient was advised of the possible risks and complications and agreed to undergo the procedure. The patient was then brought to the angiographic suite for the procedure. The left arm was prepped with chlorhexidine, draped in the usual sterile fashion using maximum barrier technique (cap and mask, sterile gown, sterile gloves, large sterile sheet, hand hygiene and cutaneous antisepsis) and infiltrated locally with 1% Lidocaine. Ultrasound demonstrated patency of the left brachial vein, and this was documented with an image. Under real-time ultrasound guidance, this vein was accessed with a 21 gauge micropuncture needle and  image documentation was performed. A 0.018 wire was introduced in to the vein. Over this, a 6 [place] dual lumen power injectable PICC was advanced to the lower SVC/right atrial junction. Fluoroscopy during the procedure and fluoro spot radiograph confirms appropriate catheter position. The catheter was flushed and covered with a sterile dressing. Catheter length: 40 cm IMPRESSION: Successful left arm power PICC line placement with ultrasound and fluoroscopic guidance. The catheter is ready for use. Electronically Signed   By: Jacqulynn Cadet M.D.   On: 08/13/2015 17:11   [place] Mfm Ob Detail +14 Wk  07/15/2015  OBSTETRICAL ULTRASOUND: This exam was performed within a [place] Ultrasound Department. The OB US report was generated in the AS system, and faxed to the ordering physician.  This report is available in the [facility]. See the AS Obstetric US report via the Image [facility].   Scheduled Meds: . sodium chloride   Intravenous Once  . sodium chloride   Intravenous Once  . cephALEXin  500 mg Oral Q12H  . enoxaparin (LOVENOX) injection  40 mg Subcutaneous Q24H  . folic acid  2 mg Oral Daily  . HYDROmorphone   Intravenous 6 times per day  . lidocaine      . methadone  5 mg Oral QHS  . prenatal multivitamin  1 tablet Oral Q1200  . sodium chloride flush  10-40 mL Intracatheter Q12H   Continuous Infusions: . sodium chloride 125 mL/hr at 08/13/15 2220    Time spent 25 minutes.

## 2015-08-13 NOTE — Care Management Important Message (Signed)
Important Message  Patient Details  Name: KANIYA MCMICHEN MRN: [id number] Date of Birth: [birth year]   Medicare Important Message Given:  Yes    Camillo Flaming 08/13/2015, 9:04 AMImportant Message  Patient Details  Name: SHAWNTAYA MCCRELESS MRN: [id number] Date of Birth: 02-Aug-1990   Medicare Important Message Given:  Yes    Camillo Flaming 08/13/2015, 9:04 AM

## 2015-08-14 ENCOUNTER — Telehealth: Payer: Self-pay

## 2015-08-14 LAB — CBC WITH DIFFERENTIAL/PLATELET
BASOS PCT: 0 %
Basophils Absolute: 0 10*3/uL (ref 0.0–0.1)
EOS ABS: 0 10*3/uL (ref 0.0–0.7)
Eosinophils Relative: 0 %
HCT: 23.4 % — ABNORMAL LOW (ref 36.0–46.0)
Hemoglobin: 8.1 g/dL — ABNORMAL LOW (ref 12.0–15.0)
LYMPHS PCT: 11 %
Lymphs Abs: 1.8 10*3/uL (ref 0.7–4.0)
MCH: 30.8 pg (ref 26.0–34.0)
MCHC: 34.6 g/dL (ref 30.0–36.0)
MCV: 89 fL (ref 78.0–100.0)
MONO ABS: 1 10*3/uL (ref 0.1–1.0)
Metamyelocytes Relative: 4 %
Monocytes Relative: 6 %
Myelocytes: 1 %
NEUTROS ABS: 13.7 10*3/uL — AB (ref 1.7–7.7)
NEUTROS PCT: 78 %
NRBC: 10 /100{WBCs} — AB
PLATELETS: 394 10*3/uL (ref 150–400)
RBC: 2.63 MIL/uL — ABNORMAL LOW (ref 3.87–5.11)
RDW: 20.2 % — ABNORMAL HIGH (ref 11.5–15.5)
WBC: 16.5 10*3/uL — AB (ref 4.0–10.5)

## 2015-08-14 LAB — TYPE AND SCREEN
ABO/RH(D): B POS
ANTIBODY SCREEN: NEGATIVE
DONOR AG TYPE: NEGATIVE
Donor AG Type: NEGATIVE
UNIT DIVISION: 0
UNIT DIVISION: 0

## 2015-08-14 LAB — COMPREHENSIVE METABOLIC PANEL
ALK PHOS: 66 U/L (ref 38–126)
ALT: 24 U/L (ref 14–54)
ANION GAP: 6 (ref 5–15)
AST: 25 U/L (ref 15–41)
Albumin: 2.9 g/dL — ABNORMAL LOW (ref 3.5–5.0)
BUN: <5 mg/dL — ABNORMAL LOW (ref 6–20)
CALCIUM: 8.4 mg/dL — AB (ref 8.9–10.3)
CO2: 23 mmol/L (ref 22–32)
CREATININE: 0.41 mg/dL — AB (ref 0.44–1.00)
Chloride: 110 mmol/L (ref 101–111)
Glucose, Bld: 115 mg/dL — ABNORMAL HIGH (ref 65–99)
Potassium: 3.6 mmol/L (ref 3.5–5.1)
Sodium: 139 mmol/L (ref 135–145)
Total Bilirubin: 4.5 mg/dL — ABNORMAL HIGH (ref 0.3–1.2)
Total Protein: 5.9 g/dL — ABNORMAL LOW (ref 6.5–8.1)

## 2015-08-14 NOTE — Progress Notes (Signed)
Called Dr Elly Modena, informed of difficulty getting a continous fhr tracing, having to hold monitor by hand, baby moving constantly, pt is sleeping, informed of what tracing I do have. Ok to [employer] monitoring.

## 2015-08-14 NOTE — Telephone Encounter (Signed)
Pt called requesting a medication refill for Oxycodone, 15mg . Thanks!

## 2015-08-14 NOTE — Telephone Encounter (Signed)
Refill request for Oxycodone 15mg . Please advise. Thanks!

## 2015-08-14 NOTE — Progress Notes (Signed)
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 Hb SS with crisis: Appears to be doing better but pain is persistent.  Has exchange blood transfusion yesterday and H/h stable. Has received 35.76 mg with 45 demands and 40 deliveries.  I will keep her on the Dilaudid PCA current dose until pain level drops. The absence of NSAID's due to pregnancy in treatment of pain makes decrease in pain slower. 2. Leukocytosis: Improved. No focus of infection. This may be related to inflammation in crisis which cannot be treated due to pregnancy.   3. Diarrhea: This is resolved.  4. 2nd Trimester Pregnancy: Evaluated by Ob-rapid response team. 5. Anemia of Chronic Disease: Patient's hemoglobin electrophoresis from 3/21 and % Hb S is still high at 74%. Has had exchange transfusion to increase her hb AA levels. This would help with her oxygen carrying capacity.    Code Status: Full Code Family Communication: N/A Disposition Plan: Not yet ready for discharge  [facility]  Pager [phone]. If 7PM-7AM, please contact night-coverage.  08/14/2015, 7:31 AM  LOS: 4 days  Interim History: Pt reports that her pain is down to a 7/10. Has been exchange transfused.  Consultants:  Ob-Gyn  Procedures:  None  Antibiotics:  None   Objective: Filed Vitals:   08/13/15 2332 08/14/15 0103 08/14/15 0535 08/14/15 0555  BP:  94/55 110/67   Pulse:  85 85   Temp:  98.6 F (37 C) 98.6 F (37 C)   TempSrc:  Oral Oral   Resp: 18 18 13 12   Height:      Weight:   80.4 kg (177 lb 4 oz)   SpO2: 98% 93% 96%    Weight change:   Intake/Output Summary (Last 24 hours) at 08/14/15 0731 Last data filed at 08/14/15 0559  Gross per 24 hour  Intake    910 ml  Output   4900 ml  Net  -3990 ml    General:  Alert, awake, oriented x3, in moderate distress due to pain.  HEENT: [place]/AT PEERL, EOMI, anicteric. Neck: Trachea midline,  no masses, no thyromegal,y no JVD, no carotid bruit OROPHARYNX:  Moist, No exudate/ erythema/lesions.  Heart: Regular rate and rhythm, without murmurs, rubs, gallops, PMI non-displaced, no heaves or thrills on palpation.  Lungs: Clear to auscultation, no wheezing or rhonchi noted. No increased vocal fremitus resonant to percussion  Abdomen: Gravid abdomen Neuro: No focal neurological deficits noted cranial nerves II through XII grossly intact.  Strength at functional baseline in bilateral upper and lower extremities. Musculoskeletal: No warm swelling or erythema around joints, no spinal tenderness noted. Psychiatric: Patient alert and oriented x3, good insight and cognition, good recent to remote recall.    Data Reviewed: Basic Metabolic Panel:  Recent Labs Lab 08/10/15 0519 08/11/15 0351 08/12/15 0605 08/14/15 0615  NA 141 140  --  139  K 3.8 3.8  --  3.6  CL 110 111  --  110  CO2 21* 21*  --  23  GLUCOSE 87 94 80 115*  BUN 8 6  --  <5*  CREATININE 0.45 0.44  --  0.41*  CALCIUM 8.8* 8.4*  --  8.4*   Liver Function Tests:  Recent Labs Lab 08/10/15 0519 08/14/15 0615  AST 22 25  ALT 23 24  ALKPHOS 63  66  BILITOT 2.3* 4.5*  PROT 6.7 5.9*  ALBUMIN 3.4* 2.9*   No results for input(s): LIPASE, AMYLASE in the last 168 hours. No results for input(s): AMMONIA in the last 168 hours. CBC:  Recent Labs Lab 08/10/15 0519 08/11/15 0351 08/14/15 0615  WBC 19.9* 16.7* 16.5*  NEUTROABS 17.2* 12.7* 13.7*  HGB 8.4* 7.6* 8.1*  HCT 23.9* 22.4* 23.4*  MCV 91.6 91.4 89.0  PLT 512* 452* 394   Cardiac Enzymes: No results for input(s): CKTOTAL, CKMB, CKMBINDEX, TROPONINI in the last 168 hours. BNP (last 3 results) No results for input(s): BNP in the last 8760 hours.  ProBNP (last 3 results) No results for input(s): PROBNP in the last 8760  hours.  CBG: No results for input(s): GLUCAP in the last 168 hours.  Recent Results (from the past 240 hour(s))  Urine culture     Status: None   Collection Time: 08/10/15 11:57 PM  Result Value Ref Range Status   Specimen Description URINE, CLEAN CATCH  Final   Special Requests NONE  Final   Culture   Final    5,000 COLONIES/mL INSIGNIFICANT GROWTH Performed at [facility]    Report Status 08/12/2015 FINAL  Final     Studies: Dg Chest 2 View  08/11/2015  CLINICAL DATA:  Hypoxia. EXAM: CHEST  2 VIEW COMPARISON:  10/25/2014. FINDINGS: No focal infiltrate. Stable cardiomegaly, no pulmonary venous congestion. Right upper lobe pleural parenchymal thickening noted consistent with scarring. No interim change. Stable calcified pulmonary nodules consistent granulomas. No pleural effusion or pneumothorax. No acute bony abnormality . IMPRESSION: No acute cardiopulmonary disease. Stable cardiomegaly. Stable changes of right upper lobe pleural parenchymal scarring and calcified pulmonary nodules consistent with granulomas. Electronically Signed   By: Marcello Moores  Register   On: 08/11/2015 11:07   Ir Fluoro Guide Cv Line Left  08/13/2015  INDICATION: 25 year old pregnant female with sickle cell disease currently and sickle cell crisis. She requires venous access for hydration and pain control. Prior attempts at right-sided PICC placement or unsuccessful. Left-sided placement will be attempted. EXAM: PICC LINE PLACEMENT WITH ULTRASOUND AND FLUOROSCOPIC GUIDANCE MEDICATIONS: None required ANESTHESIA/SEDATION: None FLUOROSCOPY TIME:  Fluoroscopy Time: 1 minutes 36 seconds (19 mGy). COMPLICATIONS: None immediate. PROCEDURE: The patient was advised of the possible risks and complications and agreed to undergo the procedure. The patient was then brought to the angiographic suite for the procedure. The left arm was prepped with chlorhexidine, draped in the usual sterile fashion using maximum barrier technique  (cap and mask, sterile gown, sterile gloves, large sterile sheet, hand hygiene and cutaneous antisepsis) and infiltrated locally with 1% Lidocaine. Ultrasound demonstrated patency of the left brachial vein, and this was documented with an image. Under real-time ultrasound guidance, this vein was accessed with a 21 gauge micropuncture needle and image documentation was performed. A 0.018 wire was introduced in to the vein. Over this, a 6 [place] dual lumen power injectable PICC was advanced to the lower SVC/right atrial junction. Fluoroscopy during the procedure and fluoro spot radiograph confirms appropriate catheter position. The catheter was flushed and covered with a sterile dressing. Catheter length: 40 cm IMPRESSION: Successful left arm power PICC line placement with ultrasound and fluoroscopic guidance. The catheter is ready for use. Electronically Signed   By: Jacqulynn Cadet M.D.   On: 08/13/2015 17:11   Ir US Guide Vasc Access Left  08/13/2015  INDICATION: 25 year old pregnant female with sickle cell disease currently and sickle cell crisis. She requires venous access for hydration  and pain control. Prior attempts at right-sided PICC placement or unsuccessful. Left-sided placement will be attempted. EXAM: PICC LINE PLACEMENT WITH ULTRASOUND AND FLUOROSCOPIC GUIDANCE MEDICATIONS: None required ANESTHESIA/SEDATION: None FLUOROSCOPY TIME:  Fluoroscopy Time: 1 minutes 36 seconds (19 mGy). COMPLICATIONS: None immediate. PROCEDURE: The patient was advised of the possible risks and complications and agreed to undergo the procedure. The patient was then brought to the angiographic suite for the procedure. The left arm was prepped with chlorhexidine, draped in the usual sterile fashion using maximum barrier technique (cap and mask, sterile gown, sterile gloves, large sterile sheet, hand hygiene and cutaneous antisepsis) and infiltrated locally with 1% Lidocaine. Ultrasound demonstrated patency of the left  brachial vein, and this was documented with an image. Under real-time ultrasound guidance, this vein was accessed with a 21 gauge micropuncture needle and image documentation was performed. A 0.018 wire was introduced in to the vein. Over this, a 6 [place] dual lumen power injectable PICC was advanced to the lower SVC/right atrial junction. Fluoroscopy during the procedure and fluoro spot radiograph confirms appropriate catheter position. The catheter was flushed and covered with a sterile dressing. Catheter length: 40 cm IMPRESSION: Successful left arm power PICC line placement with ultrasound and fluoroscopic guidance. The catheter is ready for use. Electronically Signed   By: Jacqulynn Cadet M.D.   On: 08/13/2015 17:11   [place] Mfm Ob Detail +14 Wk  07/15/2015  OBSTETRICAL ULTRASOUND: This exam was performed within a [facility]. The OB US report was generated in the [facility] system, and faxed to the ordering physician.  This report is available in the [facility]. See the [facility] Obstetric US report via the Image Link.   Scheduled Meds: . sodium chloride   Intravenous Once  . sodium chloride   Intravenous Once  . cephALEXin  500 mg Oral Q12H  . enoxaparin (LOVENOX) injection  40 mg Subcutaneous Q24H  . folic acid  2 mg Oral Daily  . HYDROmorphone   Intravenous 6 times per day  . methadone  5 mg Oral QHS  . prenatal multivitamin  1 tablet Oral Q1200  . sodium chloride flush  10-40 mL Intracatheter Q12H   Continuous Infusions: . sodium chloride 125 mL/hr at 08/13/15 2220    Time spent 25 minutes.

## 2015-08-15 LAB — CBC
HEMATOCRIT: 22.8 % — AB (ref 36.0–46.0)
Hemoglobin: 7.9 g/dL — ABNORMAL LOW (ref 12.0–15.0)
MCH: 30.7 pg (ref 26.0–34.0)
MCHC: 34.6 g/dL (ref 30.0–36.0)
MCV: 88.7 fL (ref 78.0–100.0)
PLATELETS: 362 10*3/uL (ref 150–400)
RBC: 2.57 MIL/uL — AB (ref 3.87–5.11)
RDW: 21.1 % — AB (ref 11.5–15.5)
WBC: 16.8 10*3/uL — AB (ref 4.0–10.5)

## 2015-08-15 MED ORDER — HYDROMORPHONE HCL 1 MG/ML IJ SOLN
1.0000 mg | INTRAMUSCULAR | Status: DC | PRN
  Administered 2015-08-15 – 2015-08-16 (×5): 1 mg via INTRAVENOUS
  Filled 2015-08-15 (×5): qty 1

## 2015-08-15 NOTE — Progress Notes (Signed)
Pt sleeping at intervals. Awakens easily. No vaginal bleeding or leaking of fluid. FHR tracing appropriate for gestational age. No uc's, ui noted. Dr. Elonda Husky in surgery. Circulating RN notified that FHR tracing is appropriate for 28 1/[redacted] weeks gestation. Dr. Elonda Husky can talk with me later if he has questions.

## 2015-08-15 NOTE — Progress Notes (Signed)
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 Hb SS with crisis: Patient doing better. Pain is 6/10. On Dilaudid PCA and physician assisted dosing. Has received 38.43 mg with with 45 demands and 45 deliveries.  I will keep her on the Dilaudid PCA current dose but change Physician assisted dosing to 1 mg Q 4 hrs. Re-start oral home medications. 2. Leukocytosis: Improved. Mostly resolved. Re-check in am. 3. Diarrhea: This is resolved.  4. 2nd Trimester Pregnancy: Evaluated by Ob-rapid response team. Continue per OB 5. Anemia of Chronic Disease: Patient's hemoglobin electrophoresis from 3/21 and % Hb S is still high at 74%. Has had exchange transfusion to increase her hb AA levels. This would help with her oxygen carrying capacity.    Code Status: Full Code Family Communication: N/A Disposition Plan: Not yet ready for discharge  [facility]  Pager [phone]. If 7PM-7AM, please contact night-coverage.  08/15/2015, 4:45 PM  LOS: 5 days  Interim History: Pt reports that her pain is down to a 6/10. Has been exchange transfused.  Consultants:  Ob-Gyn  Procedures:  None  Antibiotics:  None   Objective: Filed Vitals:   08/15/15 0925 08/15/15 1149 08/15/15 1335 08/15/15 1628  BP: 113/66  118/73   Pulse: 100  100   Temp: 98.4 F (36.9 C)  98.4 F (36.9 C)   TempSrc: Oral  Oral   Resp: 19 11 16 12   Height:      Weight:      SpO2: 96% 93% 95% 98%   Weight change:   Intake/Output Summary (Last 24 hours) at 08/15/15 1645 Last data filed at 08/15/15 1300  Gross per 24 hour  Intake   1915 ml  Output   2450 ml  Net   -535 ml    General: Alert, awake, oriented x3, in moderate distress due to pain.  HEENT: [place]/AT PEERL, EOMI, anicteric. Neck: Trachea midline,  no  masses, no thyromegal,y no JVD, no carotid bruit OROPHARYNX:  Moist, No exudate/ erythema/lesions.  Heart: Regular rate and rhythm, without murmurs, rubs, gallops, PMI non-displaced, no heaves or thrills on palpation.  Lungs: Clear to auscultation, no wheezing or rhonchi noted. No increased vocal fremitus resonant to percussion  Abdomen: Gravid abdomen Neuro: No focal neurological deficits noted cranial nerves II through XII grossly intact.  Strength at functional baseline in bilateral upper and lower extremities. Musculoskeletal: No warm swelling or erythema around joints, no spinal tenderness noted. Psychiatric: Patient alert and oriented x3, good insight and cognition, good recent to remote recall.    Data Reviewed: Basic Metabolic Panel:  Recent Labs Lab 08/10/15 0519 08/11/15 0351 08/12/15 0605 08/14/15 0615  NA 141 140  --  139  K 3.8 3.8  --  3.6  CL 110 111  --  110  CO2 21* 21*  --  23  GLUCOSE 87 94 80 115*  BUN 8 6  --  <5*  CREATININE 0.45 0.44  --  0.41*  CALCIUM 8.8* 8.4*  --  8.4*   Liver Function Tests:  Recent Labs Lab 08/10/15 0519 08/14/15 0615  AST 22 25  ALT 23 24  ALKPHOS 63 66  BILITOT 2.3* 4.5*  PROT 6.7 5.9*  ALBUMIN 3.4* 2.9*   No results for input(s): LIPASE, AMYLASE in the last 168 hours. No  results for input(s): AMMONIA in the last 168 hours. CBC:  Recent Labs Lab 08/10/15 0519 08/11/15 0351 08/14/15 0615 08/15/15 1040  WBC 19.9* 16.7* 16.5* 16.8*  NEUTROABS 17.2* 12.7* 13.7*  --   HGB 8.4* 7.6* 8.1* 7.9*  HCT 23.9* 22.4* 23.4* 22.8*  MCV 91.6 91.4 89.0 88.7  PLT 512* 452* 394 362   Cardiac Enzymes: No results for input(s): CKTOTAL, CKMB, CKMBINDEX, TROPONINI in the last 168 hours. BNP (last 3 results) No results for input(s): BNP in the last 8760 hours.  ProBNP (last 3 results) No results for input(s): PROBNP in the last 8760 hours.  CBG: No results for input(s): GLUCAP in the last 168 hours.  Recent Results (from the  past 240 hour(s))  Urine culture     Status: None   Collection Time: 08/10/15 11:57 PM  Result Value Ref Range Status   Specimen Description URINE, CLEAN CATCH  Final   Special Requests NONE  Final   Culture   Final    5,000 COLONIES/mL INSIGNIFICANT GROWTH Performed at [facility]    Report Status 08/12/2015 FINAL  Final     Studies: Dg Chest 2 View  08/11/2015  CLINICAL DATA:  Hypoxia. EXAM: CHEST  2 VIEW COMPARISON:  10/25/2014. FINDINGS: No focal infiltrate. Stable cardiomegaly, no pulmonary venous congestion. Right upper lobe pleural parenchymal thickening noted consistent with scarring. No interim change. Stable calcified pulmonary nodules consistent granulomas. No pleural effusion or pneumothorax. No acute bony abnormality . IMPRESSION: No acute cardiopulmonary disease. Stable cardiomegaly. Stable changes of right upper lobe pleural parenchymal scarring and calcified pulmonary nodules consistent with granulomas. Electronically Signed   By: Marcello Moores  Register   On: 08/11/2015 11:07   Ir Fluoro Guide Cv Line Left  08/13/2015  INDICATION: 25 year old pregnant female with sickle cell disease currently and sickle cell crisis. She requires venous access for hydration and pain control. Prior attempts at right-sided PICC placement or unsuccessful. Left-sided placement will be attempted. EXAM: PICC LINE PLACEMENT WITH ULTRASOUND AND FLUOROSCOPIC GUIDANCE MEDICATIONS: None required ANESTHESIA/SEDATION: None FLUOROSCOPY TIME:  Fluoroscopy Time: 1 minutes 36 seconds (19 mGy). COMPLICATIONS: None immediate. PROCEDURE: The patient was advised of the possible risks and complications and agreed to undergo the procedure. The patient was then brought to the angiographic suite for the procedure. The left arm was prepped with chlorhexidine, draped in the usual sterile fashion using maximum barrier technique (cap and mask, sterile gown, sterile gloves, large sterile sheet, hand hygiene and cutaneous  antisepsis) and infiltrated locally with 1% Lidocaine. Ultrasound demonstrated patency of the left brachial vein, and this was documented with an image. Under real-time ultrasound guidance, this vein was accessed with a 21 gauge micropuncture needle and image documentation was performed. A 0.018 wire was introduced in to the vein. Over this, a 6 [place] dual lumen power injectable PICC was advanced to the lower SVC/right atrial junction. Fluoroscopy during the procedure and fluoro spot radiograph confirms appropriate catheter position. The catheter was flushed and covered with a sterile dressing. Catheter length: 40 cm IMPRESSION: Successful left arm power PICC line placement with ultrasound and fluoroscopic guidance. The catheter is ready for use. Electronically Signed   By: Jacqulynn Cadet M.D.   On: 08/13/2015 17:11   Ir US Guide Vasc Access Left  08/13/2015  INDICATION: 25 year old pregnant female with sickle cell disease currently and sickle cell crisis. She requires venous access for hydration and pain control. Prior attempts at right-sided PICC placement or unsuccessful. Left-sided placement will be attempted. EXAM:  PICC LINE PLACEMENT WITH ULTRASOUND AND FLUOROSCOPIC GUIDANCE MEDICATIONS: None required ANESTHESIA/SEDATION: None FLUOROSCOPY TIME:  Fluoroscopy Time: 1 minutes 36 seconds (19 mGy). COMPLICATIONS: None immediate. PROCEDURE: The patient was advised of the possible risks and complications and agreed to undergo the procedure. The patient was then brought to the angiographic suite for the procedure. The left arm was prepped with chlorhexidine, draped in the usual sterile fashion using maximum barrier technique (cap and mask, sterile gown, sterile gloves, large sterile sheet, hand hygiene and cutaneous antisepsis) and infiltrated locally with 1% Lidocaine. Ultrasound demonstrated patency of the left brachial vein, and this was documented with an image. Under real-time ultrasound guidance, this  vein was accessed with a 21 gauge micropuncture needle and image documentation was performed. A 0.018 wire was introduced in to the vein. Over this, a 6 [place] dual lumen power injectable PICC was advanced to the lower SVC/right atrial junction. Fluoroscopy during the procedure and fluoro spot radiograph confirms appropriate catheter position. The catheter was flushed and covered with a sterile dressing. Catheter length: 40 cm IMPRESSION: Successful left arm power PICC line placement with ultrasound and fluoroscopic guidance. The catheter is ready for use. Electronically Signed   By: Jacqulynn Cadet M.D.   On: 08/13/2015 17:11    Scheduled Meds: . sodium chloride   Intravenous Once  . sodium chloride   Intravenous Once  . cephALEXin  500 mg Oral Q12H  . enoxaparin (LOVENOX) injection  40 mg Subcutaneous Q24H  . folic acid  2 mg Oral Daily  . HYDROmorphone   Intravenous 6 times per day  . methadone  5 mg Oral QHS  . prenatal multivitamin  1 tablet Oral Q1200  . sodium chloride flush  10-40 mL Intracatheter Q12H   Continuous Infusions: . sodium chloride 125 mL/hr at 08/15/15 1152    Time spent 25 minutes.

## 2015-08-16 LAB — COMPREHENSIVE METABOLIC PANEL
ALBUMIN: 2.9 g/dL — AB (ref 3.5–5.0)
ALT: 25 U/L (ref 14–54)
AST: 22 U/L (ref 15–41)
Alkaline Phosphatase: 72 U/L (ref 38–126)
Anion gap: 7 (ref 5–15)
BILIRUBIN TOTAL: 3.2 mg/dL — AB (ref 0.3–1.2)
BUN: 9 mg/dL (ref 6–20)
CHLORIDE: 109 mmol/L (ref 101–111)
CO2: 25 mmol/L (ref 22–32)
CREATININE: 0.51 mg/dL (ref 0.44–1.00)
Calcium: 8.7 mg/dL — ABNORMAL LOW (ref 8.9–10.3)
GFR calc Af Amer: >60 mL/min (ref >60)
GLUCOSE: 87 mg/dL (ref 65–99)
POTASSIUM: 4.2 mmol/L (ref 3.5–5.1)
Sodium: 141 mmol/L (ref 135–145)
TOTAL PROTEIN: 6.2 g/dL — AB (ref 6.5–8.1)

## 2015-08-16 LAB — CBC WITH DIFFERENTIAL/PLATELET
BASOS ABS: 0 10*3/uL (ref 0.0–0.1)
BASOS PCT: 0 %
EOS PCT: 3 %
Eosinophils Absolute: 0.5 10*3/uL (ref 0.0–0.7)
HEMATOCRIT: 24.4 % — AB (ref 36.0–46.0)
Hemoglobin: 8.3 g/dL — ABNORMAL LOW (ref 12.0–15.0)
LYMPHS PCT: 14 %
Lymphs Abs: 2.2 10*3/uL (ref 0.7–4.0)
MCH: 30.6 pg (ref 26.0–34.0)
MCHC: 34 g/dL (ref 30.0–36.0)
MCV: 90 fL (ref 78.0–100.0)
MONO ABS: 0.5 10*3/uL (ref 0.1–1.0)
MONOS PCT: 3 %
NEUTROS PCT: 80 %
NRBC: 17 /100{WBCs} — AB
Neutro Abs: 12.2 10*3/uL — ABNORMAL HIGH (ref 1.7–7.7)
Platelets: 374 10*3/uL (ref 150–400)
RBC: 2.71 MIL/uL — ABNORMAL LOW (ref 3.87–5.11)
RDW: 20.8 % — AB (ref 11.5–15.5)
WBC: 15.4 10*3/uL — ABNORMAL HIGH (ref 4.0–10.5)

## 2015-08-16 MED ORDER — HYDROMORPHONE HCL 1 MG/ML IJ SOLN
INTRAMUSCULAR | Status: AC
  Filled 2015-08-16: qty 1

## 2015-08-16 MED ORDER — HYDROMORPHONE HCL 1 MG/ML IJ SOLN
1.0000 mg | Freq: Once | INTRAMUSCULAR | Status: AC
Start: 2015-08-16 — End: 2015-08-16
  Administered 2015-08-16: 1 mg via INTRAVENOUS

## 2015-08-16 MED ORDER — OXYCODONE HCL 5 MG PO TABS
15.0000 mg | ORAL_TABLET | ORAL | Status: DC
Start: 2015-08-16 — End: 2015-08-17
  Administered 2015-08-16 – 2015-08-17 (×6): 15 mg via ORAL
  Filled 2015-08-16 (×7): qty 3

## 2015-08-16 MED ORDER — HYDROMORPHONE 1 MG/ML IV SOLN
INTRAVENOUS | Status: DC
  Administered 2015-08-16: 10.29 mg via INTRAVENOUS
  Administered 2015-08-16: 7.8 mg via INTRAVENOUS
  Administered 2015-08-16: 8.4 mg via INTRAVENOUS
  Administered 2015-08-16: 15:00:00 via INTRAVENOUS
  Administered 2015-08-16: 6.6 mg via INTRAVENOUS
  Administered 2015-08-17: 17.9 mL via INTRAVENOUS
  Administered 2015-08-17: 3 mg via INTRAVENOUS
  Filled 2015-08-16: qty 25

## 2015-08-16 NOTE — Progress Notes (Signed)
SICKLE CELL SERVICE PROGRESS NOTE  ANGEL BUCKLEW [id number] DOB: 1990-06-17 DOA: 08/10/2015 PCP: Angelica Chessman, MD  Assessment/Plan: Active Problems:   Hb-SS disease with crisis ([place])   Sickle cell pain crisis ([place])   Sickle cell anemia ([place])  1. Hb SS with crisis: Patient doing better. Pain is 5/10. On Dilaudid PCA and physician assisted dosing. Has received 42 mg with with 52 demands and 52 deliveries.  I will Dc the physician assisted dosing and basal rate. Schedule oral short acting. May be Dc'd tomorrow if stable. Leukocytosis: Improved. Mostly resolved.  2. Diarrhea: This is resolved.  3. 2nd Trimester Pregnancy: Evaluated by Ob-rapid response team. Continue per OB 4. Anemia of Chronic Disease: Patient's hemoglobin electrophoresis from 3/21 and % Hb S is still high at 74%. Has had exchange transfusion to increase her hb AA levels. H/H remains stable. This would help with her oxygen carrying capacity.    Code Status: Full Code Family Communication: N/A Disposition Plan: Not yet ready for discharge  [facility]  Pager [phone]. If 7PM-7AM, please contact night-coverage.  08/16/2015, 11:09 AM  LOS: 6 days  Interim History: Pt reports that her pain is down to a 6/10. Has been exchange transfused.  Consultants:  Ob-Gyn  Procedures:  None  Antibiotics:  None   Objective: Filed Vitals:   08/16/15 0245 08/16/15 0639 08/16/15 0740 08/16/15 1026  BP: 93/50 105/64  107/52  Pulse: 84 89  84  Temp: 97.4 F (36.3 C) 98.2 F (36.8 C)  98.6 F (37 C)  TempSrc: Oral Oral  Oral  Resp: 12 14 13 16   Height:      Weight:  78.3 kg (172 lb 9.9 oz)    SpO2: 96% 97% 99% 96%   Weight change:   Intake/Output Summary (Last 24 hours) at 08/16/15 1109 Last data filed at 08/16/15 0900  Gross per 24 hour  Intake   3575 ml  Output   3100 ml  Net    475 ml    General: Alert, awake, oriented x3, in moderate distress due to pain.  HEENT: [place]/AT PEERL, EOMI,  anicteric. Neck: Trachea midline,  no masses, no thyromegal,y no JVD, no carotid bruit OROPHARYNX:  Moist, No exudate/ erythema/lesions.  Heart: Regular rate and rhythm, without murmurs, rubs, gallops, PMI non-displaced, no heaves or thrills on palpation.  Lungs: Clear to auscultation, no wheezing or rhonchi noted. No increased vocal fremitus resonant to percussion  Abdomen: Gravid abdomen Neuro: No focal neurological deficits noted cranial nerves II through XII grossly intact.  Strength at functional baseline in bilateral upper and lower extremities. Musculoskeletal: No warm swelling or erythema around joints, no spinal tenderness noted. Psychiatric: Patient alert and oriented x3, good insight and cognition, good recent to remote recall.    Data Reviewed: Basic Metabolic Panel:  Recent Labs Lab 08/10/15 0519 08/11/15 0351 08/12/15 0605 08/14/15 0615 08/16/15 0412  NA 141 140  --  139 141  K 3.8 3.8  --  3.6 4.2  CL 110 111  --  110 109  CO2 21* 21*  --  23 25  GLUCOSE 87 94 80 115* 87  BUN 8 6  --  <5* 9  CREATININE 0.45 0.44  --  0.41* 0.51  CALCIUM 8.8* 8.4*  --  8.4* 8.7*   Liver Function Tests:  Recent Labs Lab 08/10/15 0519 08/14/15 0615 08/16/15 0412  AST 22 25 22   ALT 23 24 25   ALKPHOS 63 66 72  BILITOT 2.3* 4.5* 3.2*  PROT  6.7 5.9* 6.2*  ALBUMIN 3.4* 2.9* 2.9*   No results for input(s): LIPASE, AMYLASE in the last 168 hours. No results for input(s): AMMONIA in the last 168 hours. CBC:  Recent Labs Lab 08/10/15 0519 08/11/15 0351 08/14/15 0615 08/15/15 1040 08/16/15 0412  WBC 19.9* 16.7* 16.5* 16.8* 15.4*  NEUTROABS 17.2* 12.7* 13.7*  --  12.2*  HGB 8.4* 7.6* 8.1* 7.9* 8.3*  HCT 23.9* 22.4* 23.4* 22.8* 24.4*  MCV 91.6 91.4 89.0 88.7 90.0  PLT 512* 452* 394 362 374   Cardiac Enzymes: No results for input(s): CKTOTAL, CKMB, CKMBINDEX, TROPONINI in the last 168 hours. BNP (last 3 results) No results for input(s): BNP in the last 8760  hours.  ProBNP (last 3 results) No results for input(s): PROBNP in the last 8760 hours.  CBG: No results for input(s): GLUCAP in the last 168 hours.  Recent Results (from the past 240 hour(s))  Urine culture     Status: None   Collection Time: 08/10/15 11:57 PM  Result Value Ref Range Status   Specimen Description URINE, CLEAN CATCH  Final   Special Requests NONE  Final   Culture   Final    5,000 COLONIES/mL INSIGNIFICANT GROWTH Performed at [facility]    Report Status 08/12/2015 FINAL  Final     Studies: Dg Chest 2 View  08/11/2015  CLINICAL DATA:  Hypoxia. EXAM: CHEST  2 VIEW COMPARISON:  10/25/2014. FINDINGS: No focal infiltrate. Stable cardiomegaly, no pulmonary venous congestion. Right upper lobe pleural parenchymal thickening noted consistent with scarring. No interim change. Stable calcified pulmonary nodules consistent granulomas. No pleural effusion or pneumothorax. No acute bony abnormality . IMPRESSION: No acute cardiopulmonary disease. Stable cardiomegaly. Stable changes of right upper lobe pleural parenchymal scarring and calcified pulmonary nodules consistent with granulomas. Electronically Signed   By: Marcello Moores  Register   On: 08/11/2015 11:07   Ir Fluoro Guide Cv Line Left  08/13/2015  INDICATION: 25 year old pregnant female with sickle cell disease currently and sickle cell crisis. She requires venous access for hydration and pain control. Prior attempts at right-sided PICC placement or unsuccessful. Left-sided placement will be attempted. EXAM: PICC LINE PLACEMENT WITH ULTRASOUND AND FLUOROSCOPIC GUIDANCE MEDICATIONS: None required ANESTHESIA/SEDATION: None FLUOROSCOPY TIME:  Fluoroscopy Time: 1 minutes 36 seconds (19 mGy). COMPLICATIONS: None immediate. PROCEDURE: The patient was advised of the possible risks and complications and agreed to undergo the procedure. The patient was then brought to the angiographic suite for the procedure. The left arm was prepped with  chlorhexidine, draped in the usual sterile fashion using maximum barrier technique (cap and mask, sterile gown, sterile gloves, large sterile sheet, hand hygiene and cutaneous antisepsis) and infiltrated locally with 1% Lidocaine. Ultrasound demonstrated patency of the left brachial vein, and this was documented with an image. Under real-time ultrasound guidance, this vein was accessed with a 21 gauge micropuncture needle and image documentation was performed. A 0.018 wire was introduced in to the vein. Over this, a 6 [place] dual lumen power injectable PICC was advanced to the lower SVC/right atrial junction. Fluoroscopy during the procedure and fluoro spot radiograph confirms appropriate catheter position. The catheter was flushed and covered with a sterile dressing. Catheter length: 40 cm IMPRESSION: Successful left arm power PICC line placement with ultrasound and fluoroscopic guidance. The catheter is ready for use. Electronically Signed   By: Jacqulynn Cadet M.D.   On: 08/13/2015 17:11   Ir US Guide Vasc Access Left  08/13/2015  INDICATION: 25 year old pregnant female with sickle  cell disease currently and sickle cell crisis. She requires venous access for hydration and pain control. Prior attempts at right-sided PICC placement or unsuccessful. Left-sided placement will be attempted. EXAM: PICC LINE PLACEMENT WITH ULTRASOUND AND FLUOROSCOPIC GUIDANCE MEDICATIONS: None required ANESTHESIA/SEDATION: None FLUOROSCOPY TIME:  Fluoroscopy Time: 1 minutes 36 seconds (19 mGy). COMPLICATIONS: None immediate. PROCEDURE: The patient was advised of the possible risks and complications and agreed to undergo the procedure. The patient was then brought to the angiographic suite for the procedure. The left arm was prepped with chlorhexidine, draped in the usual sterile fashion using maximum barrier technique (cap and mask, sterile gown, sterile gloves, large sterile sheet, hand hygiene and cutaneous antisepsis) and  infiltrated locally with 1% Lidocaine. Ultrasound demonstrated patency of the left brachial vein, and this was documented with an image. Under real-time ultrasound guidance, this vein was accessed with a 21 gauge micropuncture needle and image documentation was performed. A 0.018 wire was introduced in to the vein. Over this, a 6 [place] dual lumen power injectable PICC was advanced to the lower SVC/right atrial junction. Fluoroscopy during the procedure and fluoro spot radiograph confirms appropriate catheter position. The catheter was flushed and covered with a sterile dressing. Catheter length: 40 cm IMPRESSION: Successful left arm power PICC line placement with ultrasound and fluoroscopic guidance. The catheter is ready for use. Electronically Signed   By: Jacqulynn Cadet M.D.   On: 08/13/2015 17:11    Scheduled Meds: . sodium chloride   Intravenous Once  . sodium chloride   Intravenous Once  . cephALEXin  500 mg Oral Q12H  . enoxaparin (LOVENOX) injection  40 mg Subcutaneous Q24H  . folic acid  2 mg Oral Daily  . HYDROmorphone   Intravenous 6 times per day  . methadone  5 mg Oral QHS  . oxyCODONE  15 mg Oral Q4H  . prenatal multivitamin  1 tablet Oral Q1200  . sodium chloride flush  10-40 mL Intracatheter Q12H   Continuous Infusions: . sodium chloride 125 mL/hr at 08/16/15 0938    Time spent 25 minutes.

## 2015-08-16 NOTE — Progress Notes (Signed)
Dr. Elly Modena called. FHR tracing appropriate for 28 2/[redacted] weeks gestation. Category 1 tracing. Fetus active. Cardio handheld the whole time. No uc's. No vaginal bleeding or leaking of fluid. Pt sleeping at intervals. Talking with me some.

## 2015-08-17 LAB — CBC WITH DIFFERENTIAL/PLATELET
Basophils Absolute: 0 10*3/uL (ref 0.0–0.1)
Basophils Relative: 0 %
EOS PCT: 3 %
Eosinophils Absolute: 0.4 10*3/uL (ref 0.0–0.7)
HEMATOCRIT: 22.5 % — AB (ref 36.0–46.0)
HEMOGLOBIN: 7.7 g/dL — AB (ref 12.0–15.0)
LYMPHS PCT: 11 %
Lymphs Abs: 1.5 10*3/uL (ref 0.7–4.0)
MCH: 30.9 pg (ref 26.0–34.0)
MCHC: 34.2 g/dL (ref 30.0–36.0)
MCV: 90.4 fL (ref 78.0–100.0)
MONO ABS: 1.1 10*3/uL — AB (ref 0.1–1.0)
MONOS PCT: 8 %
NEUTROS PCT: 78 %
Neutro Abs: 10.8 10*3/uL — ABNORMAL HIGH (ref 1.7–7.7)
PLATELETS: 325 10*3/uL (ref 150–400)
RBC: 2.49 MIL/uL — AB (ref 3.87–5.11)
RDW: 19.5 % — ABNORMAL HIGH (ref 11.5–15.5)
WBC: 13.8 10*3/uL — ABNORMAL HIGH (ref 4.0–10.5)

## 2015-08-17 LAB — COMPREHENSIVE METABOLIC PANEL
ALK PHOS: 67 U/L (ref 38–126)
ALT: 25 U/L (ref 14–54)
AST: 22 U/L (ref 15–41)
Albumin: 2.7 g/dL — ABNORMAL LOW (ref 3.5–5.0)
Anion gap: 6 (ref 5–15)
BUN: 7 mg/dL (ref 6–20)
CALCIUM: 8.5 mg/dL — AB (ref 8.9–10.3)
CHLORIDE: 110 mmol/L (ref 101–111)
CO2: 24 mmol/L (ref 22–32)
CREATININE: 0.38 mg/dL — AB (ref 0.44–1.00)
GFR calc Af Amer: >60 mL/min (ref >60)
Glucose, Bld: 94 mg/dL (ref 65–99)
Potassium: 3.9 mmol/L (ref 3.5–5.1)
Sodium: 140 mmol/L (ref 135–145)
Total Bilirubin: 3 mg/dL — ABNORMAL HIGH (ref 0.3–1.2)
Total Protein: 5.8 g/dL — ABNORMAL LOW (ref 6.5–8.1)

## 2015-08-17 LAB — RETICULOCYTES
RBC.: 2.44 MIL/uL — ABNORMAL LOW (ref 3.87–5.11)
RETIC CT PCT: 21.9 % — AB (ref 0.4–3.1)
Retic Count, Absolute: 534.4 10*3/uL — ABNORMAL HIGH (ref 19.0–186.0)

## 2015-08-17 NOTE — Progress Notes (Addendum)
Dr. Nehemiah Settle is in surgery. FHR tracing is appropriate for gestational age.Baseline 135, 145. Minimal to moderate variability, no accels, no decels. Circulating RN can have him  call me if he has questions. Pt says she may be d/c home this evening, and she has an ultrasound tomorrow. No vaginal bleeding or leaking of fluid. No uc's seen on EFM or palpated. No c/o uc's or cramping by pt.

## 2015-08-17 NOTE — Discharge Summary (Signed)
[name] MRN: [id number] DOB/AGE: 01-15-1991 25 y.o.  Admit date: 08/10/2015 Discharge date: 08/17/2015  Primary Care Physician:  Angelica Chessman, MD   Discharge Diagnoses:   Patient Active Problem List   Diagnosis Date Noted  . Mood swings ([place]) 11/08/2011    Priority: High  . Depression 01/06/2011    Priority: High  . Sickle cell disease ([place]) 01/08/2009    Priority: High  . High risk for intrapartum complications, antepartum 08/12/2015  . Sickle cell anemia ([place]) 08/10/2015  . Chronic pain 08/04/2015  . Cocaine use 07/24/2015  . 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 Sickle cell anemia with pain ([place]) 07/24/2015  . Maternal sickle cell anemia affecting pregnancy, antepartum ([place]) 07/14/2015  . Sickle cell pain crisis ([place]) 07/12/2015  . Second trimester pregnancy 07/07/2015  . Hb-SS disease with crisis ([place]) 06/28/2015  . Anemia of chronic disease   . Leukocytosis   . Migraines 11/08/2011  . GERD (gastroesophageal reflux disease) 02/17/2011  . 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     methadone 5 MG tablet  Commonly known as:  DOLOPHINE  Take 1 tablet (5 mg total) by mouth at bedtime.     oxyCODONE 15 MG immediate release tablet  Commonly known as:  ROXICODONE  Take 1 tablet (15 mg total) by mouth every 4 (four) hours as needed for pain.          Consults: Treatment Team:  Donnamae Jude, MD   SIGNIFICANT DIAGNOSTIC STUDIES:  Dg Chest 2 View  08/11/2015  CLINICAL DATA:  Hypoxia. EXAM: CHEST  2 VIEW COMPARISON:  10/25/2014. FINDINGS: No focal infiltrate. Stable cardiomegaly, no pulmonary venous congestion. Right upper lobe pleural parenchymal thickening noted consistent with scarring. No interim change. Stable calcified pulmonary nodules consistent granulomas. No pleural effusion or pneumothorax. No acute bony  abnormality . IMPRESSION: No acute cardiopulmonary disease. Stable cardiomegaly. Stable changes of right upper lobe pleural parenchymal scarring and calcified pulmonary nodules consistent with granulomas. Electronically Signed   By: Marcello Moores  Register   On: 08/11/2015 11:07   Ir Fluoro Guide Cv Line Left  08/13/2015  INDICATION: 25 year old pregnant female with sickle cell disease currently and sickle cell crisis. She requires venous access for hydration and pain control. Prior attempts at right-sided PICC placement or unsuccessful. Left-sided placement will be attempted. EXAM: PICC LINE PLACEMENT WITH ULTRASOUND AND FLUOROSCOPIC GUIDANCE MEDICATIONS: None required ANESTHESIA/SEDATION: None FLUOROSCOPY TIME:  Fluoroscopy Time: 1 minutes 36 seconds (19 mGy). COMPLICATIONS: None immediate. PROCEDURE: The patient was advised of the possible risks and complications and agreed to undergo the procedure. The patient was then brought to the angiographic suite for the procedure. The left arm was prepped with chlorhexidine, draped in the usual sterile fashion using maximum barrier technique (cap and mask, sterile gown, sterile gloves, large sterile sheet, hand hygiene and cutaneous antisepsis) and infiltrated locally with 1% Lidocaine. Ultrasound demonstrated patency of the left brachial vein, and this was documented with an image. Under real-time ultrasound guidance, this vein was accessed with a 21 gauge micropuncture needle and image documentation was performed. A 0.018 wire was introduced in to the vein. Over this, a 6 [place] dual lumen power injectable PICC was advanced to the lower SVC/right atrial junction. Fluoroscopy during the procedure and fluoro spot radiograph confirms appropriate catheter position. The catheter was flushed and  covered with a sterile dressing. Catheter length: 40 cm IMPRESSION: Successful left arm power PICC line placement with ultrasound and fluoroscopic guidance. The catheter is ready for use.  Electronically Signed   By: Jacqulynn Cadet M.D.   On: 08/13/2015 17:11   Ir US Guide Vasc Access Left  08/13/2015  INDICATION: 25 year old pregnant female with sickle cell disease currently and sickle cell crisis. She requires venous access for hydration and pain control. Prior attempts at right-sided PICC placement or unsuccessful. Left-sided placement will be attempted. EXAM: PICC LINE PLACEMENT WITH ULTRASOUND AND FLUOROSCOPIC GUIDANCE MEDICATIONS: None required ANESTHESIA/SEDATION: None FLUOROSCOPY TIME:  Fluoroscopy Time: 1 minutes 36 seconds (19 mGy). COMPLICATIONS: None immediate. PROCEDURE: The patient was advised of the possible risks and complications and agreed to undergo the procedure. The patient was then brought to the angiographic suite for the procedure. The left arm was prepped with chlorhexidine, draped in the usual sterile fashion using maximum barrier technique (cap and mask, sterile gown, sterile gloves, large sterile sheet, hand hygiene and cutaneous antisepsis) and infiltrated locally with 1% Lidocaine. Ultrasound demonstrated patency of the left brachial vein, and this was documented with an image. Under real-time ultrasound guidance, this vein was accessed with a 21 gauge micropuncture needle and image documentation was performed. A 0.018 wire was introduced in to the vein. Over this, a 6 [place] dual lumen power injectable PICC was advanced to the lower SVC/right atrial junction. Fluoroscopy during the procedure and fluoro spot radiograph confirms appropriate catheter position. The catheter was flushed and covered with a sterile dressing. Catheter length: 40 cm IMPRESSION: Successful left arm power PICC line placement with ultrasound and fluoroscopic guidance. The catheter is ready for use. Electronically Signed   By: Jacqulynn Cadet M.D.   On: 08/13/2015 17:11       Recent Results (from the past 240 hour(s))  Urine culture     Status: None   Collection Time: 08/10/15 11:57  PM  Result Value Ref Range Status   Specimen Description URINE, CLEAN CATCH  Final   Special Requests NONE  Final   Culture   Final    5,000 COLONIES/mL INSIGNIFICANT GROWTH Performed at [facility]    Report Status 08/12/2015 FINAL  Final    BRIEF ADMITTING H & P: This is an opiate tolerant patient with Hb SS who was recently discharged from the hospital with a sickle cell crisis. She presents today with a sudden onset of pain all over but she states is consistent with the pain of her sickle cell crisis. She alsois she's been having vomiting and diarrhea since last night. She reports that the pain is "all over". It is throbbing and admittedly sharp in nature. It is nonradiating and not associated with any other symptoms. She specifically identifies the upper back, chest wall, legs and arms. She states that she's had several episodes of emesis [place] nonbilious and non-hematemesis. She also reports diarrhea with her last stool being about 4 hours ago. She denies any fever, cough, dysuria or sick contacts.  In the emergency room she has received 3 doses of medications over a 9 hour period. She is also received a bolus of a liter of IV fluids without any further fluids. Naturally her pain at this point is at about a 10/10 and I'm asked to admit the patient for acute sickle cell crisis.   Hospital Course:  Present on Admission:  . Sickle cell pain crisis ([place]): Pt presented in acute pain crisis with pain localized to  bilateral shoulders. She was managed with Dilaudid via PCA and via IVP. Her pain was prolonged likely due to the contraindication to use of NSAID's and also to her high Hb S levels in the state of pregnancy. Her % Hb S was at 74% which would lead to more frequent, intense and prolonged crisis with a Hb S level that high. Thus, she received an exchange transfusion (remioval of 500 ml and transfusion of 2 units) of RBC's during this hospitalization. I recommend a partial exchange/  straight transfusion in 4 weeks dependent on Hb/Hct levels.   . Dehydration: Pt was mildly dehydrated on admission likely due to N/V which she reported as occurring pre-hospital. Her volume was replaced via IVF and her diet was advanced to regular diet. At the time of discharge she was tolerating a regular diet without difficulty.  . Diarrhea: Pt reported diarrhea initially on admission. This was self limited and patient has had formed stool since. Last BM yesterday.  . 3rd Trimester Pregnancy: Pt was evaluated by Ob via Ob-rapid response team. She had no complications during hospitalization. Recommend partial exchange transfusion to a goal of Hb S of 30 %.    . Anemia of chronic disease: Recommend serial exchange transfusions during pregnancy to maintain Hb S <30% and Hb levels at/> 8 g.dl.   Disposition and Follow-up:  Pt is discharged home in good condition and is to follow up with her PMD within the next week.       Discharge Instructions    AMB Referral to [place] [facility]    Complete by:  As directed   Please assign to [place] for multiple hospital admits. Written consent signed. Currently at [facility]. Please assess need for [facility] Licensed CSW upon transition of care calls. Please contact with question. Marthenia Rolling, [place], [facility] [phone]  Reason for consult:  Please assign to [place]  Expected date of contact:  1-3 days (reserved for hospital discharges)     Activity as tolerated - No restrictions    Complete by:  As directed      Diet general    Complete by:  As directed            DISCHARGE EXAM:  General: Alert, awake, oriented x3, in no apparent distress. Vital Signs: BP 110/58, HR 90, T 97.9 F (36.6 C), temperature source Oral, RR 15, height 5\' 6"  (1.676 m), weight 172 lb 9.9 oz (78.3 kg), last menstrual period 02/09/2015, SpO2 97 % on RA, not currently breastfeeding. HEENT: [place]/AT PEERL, EOMI, anicteric Neck:  Trachea midline, no masses, no thyromegal,y no JVD, no carotid bruit OROPHARYNX: Moist, No exudate/ erythema/lesions.  Heart: Regular rate and rhythm, without murmurs, rubs, gallops or S3. PMI non-displaced. Exam reveals no decreased pulses. Pulmonary/Chest: Normal effort. Breath sounds normal. No. Apnea. Clear to auscultation,no stridor,  no wheezing and no rhonchi noted. No respiratory distress and no tenderness noted. Abdomen: Gravid Neuro: Alert and oriented to person, place and time. Normal motor skills, Displays no atrophy or tremors and exhibits normal muscle tone.  No focal neurological deficits noted cranial nerves II through XII grossly intact. No sensory deficit noted. Strength at baseline in bilateral upper and lower extremities. Gait normal. Musculoskeletal: No warm swelling or erythema around joints, no spinal tenderness noted. Psychiatric: Patient alert and oriented x3, good insight and cognition, good recent to remote recall. Mood, memory, affect and judgement normal Lymph node survey: No cervical axillary or inguinal lymphadenopathy noted. Skin:  Skin is warm and dry. No bruising, no ecchymosis and no rash noted. Pt is not diaphoretic. No erythema. No pallor      Recent Labs  08/16/15 0412 08/17/15 0522  NA 141 140  K 4.2 3.9  CL 109 110  CO2 25 24  GLUCOSE 87 94  BUN 9 7  CREATININE 0.51 0.38*  CALCIUM 8.7* 8.5*    Recent Labs  08/16/15 0412 08/17/15 0522  AST 22 22  ALT 25 25  ALKPHOS 72 67  BILITOT 3.2* 3.0*  PROT 6.2* 5.8*  ALBUMIN 2.9* 2.7*   No results for input(s): LIPASE, AMYLASE in the last 72 hours.  Recent Labs  08/16/15 0412 08/17/15 0522  WBC 15.4* 13.8*  NEUTROABS 12.2* 10.8*  HGB 8.3* 7.7*  HCT 24.4* 22.5*  MCV 90.0 90.4  PLT 374 325     Total time spent including face to face and decision making was greater than 30 minutes  Signed: Kwamaine Cuppett A. 08/17/2015, 10:49 AM

## 2015-08-17 NOTE — Progress Notes (Signed)
Patient discharged home, discharge instructions reviewed and she verbalized understanding.  PICC line DC'd by IV team, no active bleeding from site.  Patient transported via wheelchair, sister to transport her home.

## 2015-08-18 ENCOUNTER — Encounter (HOSPITAL_COMMUNITY): Payer: Self-pay

## 2015-08-18 ENCOUNTER — Ambulatory Visit (HOSPITAL_COMMUNITY)
Admit: 2015-08-18 | Discharge: 2015-08-18 | Disposition: A | Discharge status: Home / Self Care | Payer: Medicare Other | Attending: Maternal and Fetal Medicine | Admitting: Maternal and Fetal Medicine

## 2015-08-18 ENCOUNTER — Other Ambulatory Visit: Payer: Self-pay | Admitting: Internal Medicine

## 2015-08-18 ENCOUNTER — Other Ambulatory Visit: Payer: Self-pay | Admitting: *Deleted

## 2015-08-18 VITALS — BP 104/69 | HR 113 | Wt 173.5 lb

## 2015-08-18 DIAGNOSIS — O0990 Supervision of high risk pregnancy, unspecified, unspecified trimester: Secondary | ICD-10-CM

## 2015-08-18 DIAGNOSIS — O98312 Other infections with a predominantly sexual mode of transmission complicating pregnancy, second trimester: Secondary | ICD-10-CM | POA: Diagnosis not present

## 2015-08-18 DIAGNOSIS — D571 Sickle-cell disease without crisis: Secondary | ICD-10-CM

## 2015-08-18 DIAGNOSIS — O98512 Other viral diseases complicating pregnancy, second trimester: Secondary | ICD-10-CM | POA: Diagnosis not present

## 2015-08-18 DIAGNOSIS — O99019 Anemia complicating pregnancy, unspecified trimester: Secondary | ICD-10-CM

## 2015-08-18 DIAGNOSIS — O99322 Drug use complicating pregnancy, second trimester: Secondary | ICD-10-CM | POA: Insufficient documentation

## 2015-08-18 DIAGNOSIS — O0932 Supervision of pregnancy with insufficient antenatal care, second trimester: Secondary | ICD-10-CM | POA: Diagnosis not present

## 2015-08-18 DIAGNOSIS — B009 Herpesviral infection, unspecified: Secondary | ICD-10-CM | POA: Diagnosis not present

## 2015-08-18 DIAGNOSIS — A6000 Herpesviral infection of urogenital system, unspecified: Secondary | ICD-10-CM | POA: Diagnosis not present

## 2015-08-18 DIAGNOSIS — D5700 Hb-SS disease with crisis, unspecified: Secondary | ICD-10-CM

## 2015-08-18 DIAGNOSIS — F1110 Opioid abuse, uncomplicated: Secondary | ICD-10-CM | POA: Insufficient documentation

## 2015-08-18 DIAGNOSIS — O99012 Anemia complicating pregnancy, second trimester: Secondary | ICD-10-CM | POA: Insufficient documentation

## 2015-08-18 DIAGNOSIS — Z3A27 27 weeks gestation of pregnancy: Secondary | ICD-10-CM | POA: Diagnosis not present

## 2015-08-18 MED ORDER — OXYCODONE HCL 15 MG PO TABS: 15.0000 mg | ORAL_TABLET | ORAL | Status: DC | PRN

## 2015-08-18 MED FILL — oxyCODONE HCL 15 MG TABS: 15 | 15 days supply | Qty: 90 | Fill #0

## 2015-08-18 NOTE — Telephone Encounter (Signed)
Patient is aware of Oxycodone prescription being ready for pickup. Patient states she is on the way to the office. No further questions or concerns at this time.

## 2015-08-18 NOTE — Patient Outreach (Signed)
Referral received from hospital liaison to begin transition of care program once discharged.  Member admitted to hospital with diagnosis of sickle cell crisis, she is also currently pregnant.  According to chart, she has a history of acid reflux and depression.  Call placed to member at listed preferred number, [phone].  No answer, HIPPA compliant voice message left.  Will await call back, will make second attempt later this week.  Valente David, BSN, [place] [facility]  [facility] Care Manager [phone]

## 2015-08-20 ENCOUNTER — Other Ambulatory Visit: Payer: Self-pay | Admitting: *Deleted

## 2015-08-20 NOTE — Patient Outreach (Signed)
Second attempt made to contact member, this time successful.  She state that although she is "always in pain," she is feeling better today.  She reports that her ultrasound appointment was "good" denies concerns regarding pregnancy.  She states she has a follow up appointment with her PCP within the next couple weeks, denies the need for assistance with transportation, stating "I will have a car by then.  She does express interest in a program that she was told about while she was hospitalized at the [facility].  She states that the program is for single mothers, and that she is interested in more socialization.  Made aware that this care manager will inquire about this with a social worker and follow up with her.  She denies any further concerns, denies needing and pharmacy or social worker assistance at this time.  Contact information provided, encouraged to contact with questions.  Will continue with transition of care program next week.  Valente David, BSN, [place] [facility]  [facility] Care Manager [phone]

## 2015-08-26 ENCOUNTER — Other Ambulatory Visit: Payer: Self-pay | Admitting: *Deleted

## 2015-08-26 NOTE — Patient Outreach (Signed)
Weekly transition of care call placed to member.  She reports she is doing "good" and denies any complications with her pregnancy and denies any acute pain related to her sickle cell.  She states she will be getting her car tomorrow and will be able to drive herself to her follow up appointment next week.  Provided member with contact information for the [facility] contact person for the young, single mother's program she was referring to during the last conversation.  She is very appreciative, stating she is trying to make positive changes in her life.  She state she will be starting school, and have accomplished other personal goals she has set over the last several months.  Encouragement given to remain positive and request any assistance for resources the is in need of.    Denies questions at this time.  Will continue with transition of care calls next week.  Valente David, BSN, [place] Management  [facility] Care Manager [phone]

## 2015-08-27 ENCOUNTER — Telehealth: Payer: Self-pay

## 2015-08-27 NOTE — Telephone Encounter (Signed)
Pt is requesting medication refills on Oxycodone and Methydone. Thanks!

## 2015-08-27 NOTE — Telephone Encounter (Signed)
Refill request for Oxycodone and Methadone. LOV 08/04/2015. Please advise. Thanks!

## 2015-08-29 ENCOUNTER — Encounter: Payer: Self-pay | Admitting: *Deleted

## 2015-09-01 ENCOUNTER — Ambulatory Visit (INDEPENDENT_AMBULATORY_CARE_PROVIDER_SITE_OTHER): Payer: Medicare Other | Admitting: Internal Medicine

## 2015-09-01 ENCOUNTER — Encounter: Payer: Self-pay | Admitting: Internal Medicine

## 2015-09-01 VITALS — BP 114/52 | HR 100 | Temp 98.5°F | Resp 18 | Ht 62.0 in | Wt 175.0 lb

## 2015-09-01 DIAGNOSIS — D571 Sickle-cell disease without crisis: Secondary | ICD-10-CM | POA: Diagnosis not present

## 2015-09-01 DIAGNOSIS — G8929 Other chronic pain: Secondary | ICD-10-CM | POA: Diagnosis not present

## 2015-09-01 DIAGNOSIS — Z331 Pregnant state, incidental: Secondary | ICD-10-CM | POA: Diagnosis not present

## 2015-09-01 DIAGNOSIS — Z3493 Encounter for supervision of normal pregnancy, unspecified, third trimester: Secondary | ICD-10-CM | POA: Insufficient documentation

## 2015-09-01 MED ORDER — METHADONE HCL 5 MG PO TABS: 5.0000 mg | ORAL_TABLET | Freq: Every day | ORAL | Status: DC

## 2015-09-01 MED ORDER — OXYCODONE HCL 15 MG PO TABS: 15.0000 mg | ORAL_TABLET | ORAL | Status: DC | PRN

## 2015-09-01 MED FILL — METHADONE HCL 5 MG TABLET: 5 | 30 days supply | Qty: 30 | Fill #0

## 2015-09-01 MED FILL — oxyCODONE HCL 15 MG TABS: 15 | 15 days supply | Qty: 90 | Fill #0

## 2015-09-01 NOTE — Patient Instructions (Signed)
Third Trimester of Pregnancy The third trimester is from week 29 through week 42, months 7 through 9. The third trimester is a time when the fetus is growing rapidly. At the end of the ninth month, the fetus is about 20 inches in length and weighs 6-10 pounds.  BODY CHANGES Your body goes through many changes during pregnancy. The changes vary from woman to woman.   Your weight will continue to increase. You can expect to gain 25-35 pounds (11-16 kg) by the end of the pregnancy.  You may begin to get stretch marks on your hips, abdomen, and breasts.  You may urinate more often because the fetus is moving lower into your pelvis and pressing on your bladder.  You may develop or continue to have heartburn as a result of your pregnancy.  You may develop constipation because certain hormones are causing the muscles that push waste through your intestines to slow down.  You may develop hemorrhoids or swollen, bulging veins (varicose veins).  You may have pelvic pain because of the weight gain and pregnancy hormones relaxing your joints between the bones in your pelvis. Backaches may result from overexertion of the muscles supporting your posture.  You may have changes in your hair. These can include thickening of your hair, rapid growth, and changes in texture. Some women also have hair loss during or after pregnancy, or hair that feels dry or thin. Your hair will most likely return to normal after your baby is born.  Your breasts will continue to grow and be tender. A yellow discharge may leak from your breasts called colostrum.  Your belly button may stick out.  You may feel short of breath because of your expanding uterus.  You may notice the fetus "dropping," or moving lower in your abdomen.  You may have a bloody mucus discharge. This usually occurs a few days to a week before labor begins.  Your cervix becomes thin and soft (effaced) near your due date. WHAT TO EXPECT AT YOUR PRENATAL  EXAMS  You will have prenatal exams every 2 weeks until week 36. Then, you will have weekly prenatal exams. During a routine prenatal visit:  You will be weighed to make sure you and the fetus are growing normally.  Your blood pressure is taken.  Your abdomen will be measured to track your baby's growth.  The fetal heartbeat will be listened to.  Any test results from the previous visit will be discussed.  You may have a cervical check near your due date to see if you have effaced. At around 36 weeks, your caregiver will check your cervix. At the same time, your caregiver will also perform a test on the secretions of the vaginal tissue. This test is to determine if a type of bacteria, Group B streptococcus, is present. Your caregiver will explain this further. Your caregiver may ask you:  What your birth plan is.  How you are feeling.  If you are feeling the baby move.  If you have had any abnormal symptoms, such as leaking fluid, bleeding, severe headaches, or abdominal cramping.  If you are using any tobacco products, including cigarettes, chewing tobacco, and electronic cigarettes.  If you have any questions. Other tests or screenings that may be performed during your third trimester include:  Blood tests that check for low iron levels (anemia).  Fetal testing to check the health, activity level, and growth of the fetus. Testing is done if you have certain medical conditions or if  there are problems during the pregnancy.  HIV (human immunodeficiency virus) testing. If you are at high risk, you may be screened for HIV during your third trimester of pregnancy. FALSE LABOR You may feel small, irregular contractions that eventually go away. These are called Braxton Hicks contractions, or false labor. Contractions may last for hours, days, or even weeks before true labor sets in. If contractions come at regular intervals, intensify, or become painful, it is best to be seen by your  caregiver.  SIGNS OF LABOR   Menstrual-like cramps.  Contractions that are 5 minutes apart or less.  Contractions that start on the top of the uterus and spread down to the lower abdomen and back.  A sense of increased pelvic pressure or back pain.  A watery or bloody mucus discharge that comes from the vagina. If you have any of these signs before the 37th week of pregnancy, call your caregiver right away. You need to go to the hospital to get checked immediately. HOME CARE INSTRUCTIONS   Avoid all smoking, herbs, alcohol, and unprescribed drugs. These chemicals affect the formation and growth of the baby.  Do not use any tobacco products, including cigarettes, chewing tobacco, and electronic cigarettes. If you need help quitting, ask your health care provider. You may receive counseling support and other resources to help you quit.  Follow your caregiver's instructions regarding medicine use. There are medicines that are either safe or unsafe to take during pregnancy.  Exercise only as directed by your caregiver. Experiencing uterine cramps is a good sign to stop exercising.  Continue to eat regular, healthy meals.  Wear a good support bra for breast tenderness.  Do not use hot tubs, steam rooms, or saunas.  Wear your seat belt at all times when driving.  Avoid raw meat, uncooked cheese, cat litter boxes, and soil used by cats. These carry germs that can cause birth defects in the baby.  Take your prenatal vitamins.  Take 1500-2000 mg of calcium daily starting at the 20th week of pregnancy until you deliver your baby.  Try taking a stool softener (if your caregiver approves) if you develop constipation. Eat more high-fiber foods, such as fresh vegetables or fruit and whole grains. Drink plenty of fluids to keep your urine clear or pale yellow.  Take warm sitz baths to soothe any pain or discomfort caused by hemorrhoids. Use hemorrhoid cream if your caregiver approves.  If  you develop varicose veins, wear support hose. Elevate your feet for 15 minutes, 3-4 times a day. Limit salt in your diet.  Avoid heavy lifting, wear low heal shoes, and practice good posture.  Rest a lot with your legs elevated if you have leg cramps or low back pain.  Visit your dentist if you have not gone during your pregnancy. Use a soft toothbrush to brush your teeth and be gentle when you floss.  A sexual relationship may be continued unless your caregiver directs you otherwise.  Do not travel far distances unless it is absolutely necessary and only with the approval of your caregiver.  Take prenatal classes to understand, practice, and ask questions about the labor and delivery.  Make a trial run to the hospital.  Pack your hospital bag.  Prepare the baby's nursery.  Continue to go to all your prenatal visits as directed by your caregiver. SEEK MEDICAL CARE IF:  You are unsure if you are in labor or if your water has broken.  You have dizziness.  You have  mild pelvic cramps, pelvic pressure, or nagging pain in your abdominal area.  You have persistent nausea, vomiting, or diarrhea.  You have a bad smelling vaginal discharge.  You have pain with urination. SEEK IMMEDIATE MEDICAL CARE IF:   You have a fever.  You are leaking fluid from your vagina.  You have spotting or bleeding from your vagina.  You have severe abdominal cramping or pain.  You have rapid weight loss or gain.  You have shortness of breath with chest pain.  You notice sudden or extreme swelling of your face, hands, ankles, feet, or legs.  You have not felt your baby move in over an hour.  You have severe headaches that do not go away with medicine.  You have vision changes.   This information is not intended to replace advice given to you by your health care provider. Make sure you discuss any questions you have with your health care provider.   Document Released: 04/26/2001 Document  Revised: 05/23/2014 Document Reviewed: 07/03/2012 Elsevier Interactive Patient Education 2016 Elsevier Inc. Sickle Cell Anemia, Adult Sickle cell anemia is a condition in which red blood cells have an abnormal "sickle" shape. This abnormal shape shortens the cells' life span, which results in a lower than normal concentration of red blood cells in the blood. The sickle shape also causes the cells to clump together and block free blood flow through the blood vessels. As a result, the tissues and organs of the body do not receive enough oxygen. Sickle cell anemia causes organ damage and pain and increases the risk of infection. CAUSES  Sickle cell anemia is a genetic disorder. Those who receive two copies of the gene have the condition, and those who receive one copy have the trait. RISK FACTORS The sickle cell gene is most common in people whose families originated in [place]. Other areas of the globe where sickle cell trait occurs include the [place], [place] and [place], and the [place].  SIGNS AND SYMPTOMS  Pain, especially in the extremities, back, chest, or abdomen (common). The pain may start suddenly or may develop following an illness, especially if there is dehydration. Pain can also occur due to overexertion or exposure to extreme temperature changes.  Frequent severe bacterial infections, especially certain types of pneumonia and meningitis.  Pain and swelling in the hands and feet.  Decreased activity.   Loss of appetite.   Change in behavior.  Headaches.  Seizures.  Shortness of breath or difficulty breathing.  Vision changes.  Skin ulcers. Those with the trait may not have symptoms or they may have mild symptoms.  DIAGNOSIS  Sickle cell anemia is diagnosed with blood tests that demonstrate the genetic trait. It is often diagnosed during the newborn period, due to mandatory testing nationwide. A variety of blood tests, X-rays, CT scans,  MRI scans, ultrasounds, and lung function tests may also be done to monitor the condition. TREATMENT  Sickle cell anemia may be treated with:  Medicines. You may be given pain medicines, antibiotic medicines (to treat and prevent infections) or medicines to increase the production of certain types of hemoglobin.  Fluids.  Oxygen.  Blood transfusions. HOME CARE INSTRUCTIONS   Drink enough fluid to keep your urine clear or pale yellow. Increase your fluid intake in hot weather and during exercise.  Do not smoke. Smoking lowers oxygen levels in the blood.   Only take over-the-counter or prescription medicines for pain, fever, or discomfort as directed by your health care  provider.  Take antibiotics as directed by your health care provider. Make sure you finish them it even if you start to feel better.   Take supplements as directed by your health care provider.   Consider wearing a medical alert bracelet. This tells anyone caring for you in an emergency of your condition.   When traveling, keep your medical information, health care provider's names, and the medicines you take with you at all times.   If you develop a fever, do not take medicines to reduce the fever right away. This could cover up a problem that is developing. Notify your health care provider.  Keep all follow-up appointments with your health care provider. Sickle cell anemia requires regular medical care. SEEK MEDICAL CARE IF: You have a fever. SEEK IMMEDIATE MEDICAL CARE IF:   You feel dizzy or faint.   You have new abdominal pain, especially on the left side near the stomach area.   You develop a persistent, often uncomfortable and painful penile erection (priapism). If this is not treated immediately it will lead to impotence.   You have numbness your arms or legs or you have a hard time moving them.   You have a hard time with speech.   You have a fever or persistent symptoms for more than 2-3  days.   You have a fever and your symptoms suddenly get worse.   You have signs or symptoms of infection. These include:   Chills.   Abnormal tiredness (lethargy).   Irritability.   Poor eating.   Vomiting.   You develop pain that is not helped with medicine.   You develop shortness of breath.  You have pain in your chest.   You are coughing up pus-like or bloody sputum.   You develop a stiff neck.  Your feet or hands swell or have pain.  Your abdomen appears bloated.  You develop joint pain. MAKE SURE YOU:  Understand these instructions.   This information is not intended to replace advice given to you by your health care provider. Make sure you discuss any questions you have with your health care provider.   Document Released: 08/10/2005 Document Revised: 05/23/2014 Document Reviewed: 12/12/2012 Elsevier Interactive Patient Education [employer].

## 2015-09-01 NOTE — Progress Notes (Signed)
Patient is here for 1 month FU  Patient denies pain at this time.  Patient mentioned she will be monitored by [facility] from 32-37 weeks. Patient states she was advised to have a blood transfusion every month until the baby is born.

## 2015-09-01 NOTE — Progress Notes (Signed)
Patient ID: [name], female   DOB: 1991-01-31, 25 y.o.   MRN: [id number]   [name], is a 25 y.o. female  [id number]  [id number]  DOB - [birth year]  No chief complaint on file.       Subjective:   [name] is a 25 y.o. female with sickle cell anemia, now in third trimester of her second pregnancy here today for a follow up visit. Patient claimed that she is doing very well, has not been to the hospital or ED in more than 4 weeks, no significant pain, pregnancy is progressing well. Patient is very happy with herself, working with a Theme park manager from her church concerning her social lives and habits, now she has a car for mobility. She has no significant complaints today. She is scheduled for weekly follow-up with obstetrician from the 32nd week until delivery, today she is at 30 weeks of gestation. She needs refill on her pain medications. Patient has No headache, No chest pain, No abdominal pain - No Nausea, No new weakness tingling or numbness, No Cough - SOB.  Problem  Third Trimester Pregnancy    ALLERGIES: Allergies  Allergen Reactions  . Carrot [Daucus Carota] Hives, Swelling and Rash    Dietary:  Please do not send any form of carrot to patient  . Carrot Oil Hives and Swelling  . Latex Rash    *powdered Latex*     PAST MEDICAL HISTORY: Past Medical History  Diagnosis Date  . Miscarriage 03/22/2011    Pt reports 2 miscarriages.  . Depression 01/06/2011  . GERD (gastroesophageal reflux disease) 02/17/2011  . Trichotillomania     h/o  . Blood transfusion     "lots"  . Sickle cell anemia with crisis ([place])   . Exertional dyspnea     "sometimes"  . Migraines 11/08/11    "@ least twice/month"  . Chronic back pain     "very severe; have knot in my back; from tight muscle; take RX and exercise for it"  . Mood swings ([place]) 11/08/11    "I go back and forth; real bad"  . Sickle cell anemia ([name])   . Blood transfusion without reported diagnosis      MEDICATIONS AT HOME: Prior to Admission medications   Medication Sig Start Date End Date Taking? Authorizing Provider  folic acid (FOLVITE) 1 MG tablet Take 1 mg by mouth daily.   Yes Historical Provider, MD  methadone (DOLOPHINE) 5 MG tablet Take 1 tablet (5 mg total) by mouth at bedtime. 09/01/15  Yes Ernie Sagrero Essie Christine, MD  oxyCODONE (ROXICODONE) 15 MG immediate release tablet Take 1 tablet (15 mg total) by mouth every 4 (four) hours as needed for pain. 09/01/15  Yes Tresa Garter, MD  Prenatal Vit-Min-FA-Fish Oil ([facility] PRENATAL GUMMY) 0.4-113.5 MG CHEW Chew 2-3 each by mouth daily.   Yes Historical Provider, MD     Objective:   Filed Vitals:   09/01/15 0957  BP: 114/52  Pulse: 100  Temp: 98.5 F (36.9 C)  TempSrc: Oral  Resp: 18  Height: 5\' 2"  (1.575 m)  Weight: 175 lb (79.379 kg)  SpO2: 100%    Exam General appearance : Awake, alert, not in any distress. Speech Clear. Not toxic looking HEENT: Atraumatic and Normocephalic, pupils equally reactive to light and accomodation Neck: supple, no JVD. No cervical lymphadenopathy.  Chest: Good air entry bilaterally, no added sounds  CVS: S1 S2 regular, systolic flow murmurs, aortic area.  Abdomen: Bowel sounds present, Non  tender and not distended with no gaurding, rigidity or rebound. 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  Dispense: 90 tablet; Refill: 0  - Hemoglobinopathy evaluation: Check % quantity of hemoglobin S, patient may need partial exchange transfusion if greater than 30%  - CBC with Differential/Platelet  2. Third trimester pregnancy  -  Hemoglobinopathy evaluation: Check % quantity of hemoglobin S, patient may need partial exchange transfusion if greater than 30%  3. Chronic pain  - methadone (DOLOPHINE) 5 MG tablet; Take 1 tablet (5 mg total) by mouth at bedtime.  Dispense: 30 tablet; Refill: 0 Patient have been counseled extensively about nutrition and exercise  Return in about 4 weeks (around 09/29/2015) for Sickle Cell Disease/Pain.  The patient was given clear instructions to go to ER or return to medical center if symptoms don't improve, worsen or new problems develop. The patient verbalized understanding. The patient was told to call to get lab results if they haven't heard anything in the next week.   This note has been created with Surveyor, quantity. Any transcriptional errors are unintentional.    Angelica Chessman, MD, [place], Karilyn Cota, [place] [facility] [place], [place]   09/01/2015, 10:26 AM

## 2015-09-02 LAB — CBC WITH DIFFERENTIAL/PLATELET
BASOS PCT: 0 %
Basophils Absolute: 0 cells/uL (ref 0–200)
EOS PCT: 1 %
Eosinophils Absolute: 152 cells/uL (ref 15–500)
HCT: 28.1 % — ABNORMAL LOW (ref 35.0–45.0)
HEMOGLOBIN: 8.9 g/dL — AB (ref 11.7–15.5)
LYMPHS ABS: 1976 {cells}/uL (ref 850–3900)
Lymphocytes Relative: 13 %
MCH: 29.8 pg (ref 27.0–33.0)
MCHC: 31.7 g/dL — AB (ref 32.0–36.0)
MCV: 94 fL (ref 80.0–100.0)
MONO ABS: 1216 {cells}/uL — AB (ref 200–950)
MPV: 9.1 fL (ref 7.5–12.5)
Monocytes Relative: 8 %
NEUTROS ABS: 11856 {cells}/uL — AB (ref 1500–7800)
NEUTROS PCT: 78 %
Platelets: 480 10*3/uL — ABNORMAL HIGH (ref 140–400)
RBC: 2.99 MIL/uL — AB (ref 3.80–5.10)
RDW: 20 % — AB (ref 11.0–15.0)
WBC: 15.2 10*3/uL — AB (ref 3.8–10.8)

## 2015-09-03 ENCOUNTER — Other Ambulatory Visit: Payer: Self-pay | Admitting: *Deleted

## 2015-09-03 LAB — HEMOGLOBINOPATHY EVALUATION
HEMOGLOBIN OTHER: 0 %
HGB A2 QUANT: 2.9 % (ref 2.2–3.2)
Hgb A: 20.8 % — ABNORMAL LOW (ref 96.8–97.8)
Hgb F Quant: 10.8 % — ABNORMAL HIGH (ref 0.0–2.0)
Hgb S Quant: 65.5 % — ABNORMAL HIGH

## 2015-09-03 NOTE — Patient Outreach (Signed)
Weekly transition of care call placed to member.  She state she is driving and will call back once she is to her destination.  Will await call back.  Valente David, BSN, [place] Management  [facility] Care Manager [phone]

## 2015-09-07 ENCOUNTER — Encounter (HOSPITAL_COMMUNITY): Payer: Self-pay | Admitting: *Deleted

## 2015-09-07 ENCOUNTER — Other Ambulatory Visit: Payer: Self-pay | Admitting: Family Medicine

## 2015-09-07 ENCOUNTER — Inpatient Hospital Stay (HOSPITAL_COMMUNITY)
Admission: EM | Admit: 2015-09-07 | Discharge: 2015-09-14 | DRG: 781 | Disposition: A | Discharge status: Home / Self Care | Payer: Medicare Other | Attending: Internal Medicine | Admitting: Internal Medicine

## 2015-09-07 ENCOUNTER — Inpatient Hospital Stay (HOSPITAL_COMMUNITY): Admission: RE | Admit: 2015-09-07 | Payer: Medicare Other | Source: Ambulatory Visit

## 2015-09-07 ENCOUNTER — Emergency Department (HOSPITAL_COMMUNITY): Payer: Medicare Other

## 2015-09-07 DIAGNOSIS — R1084 Generalized abdominal pain: Secondary | ICD-10-CM | POA: Diagnosis not present

## 2015-09-07 DIAGNOSIS — D72829 Elevated white blood cell count, unspecified: Secondary | ICD-10-CM | POA: Diagnosis present

## 2015-09-07 DIAGNOSIS — F329 Major depressive disorder, single episode, unspecified: Secondary | ICD-10-CM | POA: Diagnosis present

## 2015-09-07 DIAGNOSIS — Z833 Family history of diabetes mellitus: Secondary | ICD-10-CM

## 2015-09-07 DIAGNOSIS — Z91018 Allergy to other foods: Secondary | ICD-10-CM | POA: Diagnosis not present

## 2015-09-07 DIAGNOSIS — Z331 Pregnant state, incidental: Secondary | ICD-10-CM | POA: Diagnosis not present

## 2015-09-07 DIAGNOSIS — Z8249 Family history of ischemic heart disease and other diseases of the circulatory system: Secondary | ICD-10-CM | POA: Diagnosis not present

## 2015-09-07 DIAGNOSIS — O9989 Other specified diseases and conditions complicating pregnancy, childbirth and the puerperium: Secondary | ICD-10-CM | POA: Diagnosis present

## 2015-09-07 DIAGNOSIS — O0990 Supervision of high risk pregnancy, unspecified, unspecified trimester: Secondary | ICD-10-CM

## 2015-09-07 DIAGNOSIS — K5900 Constipation, unspecified: Secondary | ICD-10-CM | POA: Diagnosis not present

## 2015-09-07 DIAGNOSIS — G43909 Migraine, unspecified, not intractable, without status migrainosus: Secondary | ICD-10-CM | POA: Diagnosis present

## 2015-09-07 DIAGNOSIS — Z3493 Encounter for supervision of normal pregnancy, unspecified, third trimester: Secondary | ICD-10-CM | POA: Diagnosis not present

## 2015-09-07 DIAGNOSIS — R112 Nausea with vomiting, unspecified: Secondary | ICD-10-CM | POA: Diagnosis present

## 2015-09-07 DIAGNOSIS — O99013 Anemia complicating pregnancy, third trimester: Principal | ICD-10-CM | POA: Diagnosis present

## 2015-09-07 DIAGNOSIS — R197 Diarrhea, unspecified: Secondary | ICD-10-CM | POA: Diagnosis present

## 2015-09-07 DIAGNOSIS — R110 Nausea: Secondary | ICD-10-CM | POA: Diagnosis not present

## 2015-09-07 DIAGNOSIS — R1110 Vomiting, unspecified: Secondary | ICD-10-CM

## 2015-09-07 DIAGNOSIS — R109 Unspecified abdominal pain: Secondary | ICD-10-CM | POA: Diagnosis not present

## 2015-09-07 DIAGNOSIS — Z3490 Encounter for supervision of normal pregnancy, unspecified, unspecified trimester: Secondary | ICD-10-CM

## 2015-09-07 DIAGNOSIS — Z452 Encounter for adjustment and management of vascular access device: Secondary | ICD-10-CM

## 2015-09-07 DIAGNOSIS — O99019 Anemia complicating pregnancy, unspecified trimester: Secondary | ICD-10-CM

## 2015-09-07 DIAGNOSIS — D5700 Hb-SS disease with crisis, unspecified: Secondary | ICD-10-CM

## 2015-09-07 DIAGNOSIS — Z91040 Latex allergy status: Secondary | ICD-10-CM

## 2015-09-07 DIAGNOSIS — O212 Late vomiting of pregnancy: Secondary | ICD-10-CM | POA: Diagnosis not present

## 2015-09-07 DIAGNOSIS — K219 Gastro-esophageal reflux disease without esophagitis: Secondary | ICD-10-CM | POA: Diagnosis present

## 2015-09-07 DIAGNOSIS — Z3A31 31 weeks gestation of pregnancy: Secondary | ICD-10-CM

## 2015-09-07 DIAGNOSIS — R1114 Bilious vomiting: Secondary | ICD-10-CM

## 2015-09-07 DIAGNOSIS — M549 Dorsalgia, unspecified: Secondary | ICD-10-CM | POA: Diagnosis present

## 2015-09-07 DIAGNOSIS — E860 Dehydration: Secondary | ICD-10-CM | POA: Diagnosis not present

## 2015-09-07 DIAGNOSIS — G8929 Other chronic pain: Secondary | ICD-10-CM | POA: Diagnosis present

## 2015-09-07 DIAGNOSIS — D571 Sickle-cell disease without crisis: Secondary | ICD-10-CM

## 2015-09-07 DIAGNOSIS — O99283 Endocrine, nutritional and metabolic diseases complicating pregnancy, third trimester: Secondary | ICD-10-CM | POA: Diagnosis present

## 2015-09-07 DIAGNOSIS — D638 Anemia in other chronic diseases classified elsewhere: Secondary | ICD-10-CM | POA: Diagnosis not present

## 2015-09-07 LAB — CBC WITH DIFFERENTIAL/PLATELET
BASOS ABS: 0 10*3/uL (ref 0.0–0.1)
Basophils Relative: 0 %
Eosinophils Absolute: 0.1 10*3/uL (ref 0.0–0.7)
Eosinophils Relative: 0 %
HEMATOCRIT: 28.4 % — AB (ref 36.0–46.0)
HEMOGLOBIN: 9.8 g/dL — AB (ref 12.0–15.0)
LYMPHS PCT: 5 %
Lymphs Abs: 0.9 10*3/uL (ref 0.7–4.0)
MCH: 31.1 pg (ref 26.0–34.0)
MCHC: 34.5 g/dL (ref 30.0–36.0)
MCV: 90.2 fL (ref 78.0–100.0)
MONO ABS: 1.2 10*3/uL — AB (ref 0.1–1.0)
Monocytes Relative: 7 %
NEUTROS ABS: 16.1 10*3/uL — AB (ref 1.7–7.7)
NEUTROS PCT: 88 %
Platelets: 507 10*3/uL — ABNORMAL HIGH (ref 150–400)
RBC: 3.15 MIL/uL — ABNORMAL LOW (ref 3.87–5.11)
RDW: 19.4 % — AB (ref 11.5–15.5)
WBC: 18.3 10*3/uL — ABNORMAL HIGH (ref 4.0–10.5)

## 2015-09-07 LAB — COMPREHENSIVE METABOLIC PANEL
ALBUMIN: 3.7 g/dL (ref 3.5–5.0)
ALK PHOS: 83 U/L (ref 38–126)
ALT: 31 U/L (ref 14–54)
AST: 27 U/L (ref 15–41)
Anion gap: 10 (ref 5–15)
BILIRUBIN TOTAL: 2.8 mg/dL — AB (ref 0.3–1.2)
CALCIUM: 8.8 mg/dL — AB (ref 8.9–10.3)
CO2: 20 mmol/L — ABNORMAL LOW (ref 22–32)
CREATININE: 0.46 mg/dL (ref 0.44–1.00)
Chloride: 108 mmol/L (ref 101–111)
GFR calc Af Amer: >60 mL/min (ref >60)
GLUCOSE: 109 mg/dL — AB (ref 65–99)
POTASSIUM: 3.5 mmol/L (ref 3.5–5.1)
Sodium: 138 mmol/L (ref 135–145)
TOTAL PROTEIN: 7.4 g/dL (ref 6.5–8.1)

## 2015-09-07 LAB — URINE MICROSCOPIC-ADD ON: RBC / HPF: NONE SEEN RBC/hpf (ref 0–5)

## 2015-09-07 LAB — URINALYSIS, ROUTINE W REFLEX MICROSCOPIC
BILIRUBIN URINE: NEGATIVE
GLUCOSE, UA: NEGATIVE mg/dL
HGB URINE DIPSTICK: NEGATIVE
KETONES UR: 40 mg/dL — AB
Nitrite: NEGATIVE
PROTEIN: NEGATIVE mg/dL
Specific Gravity, Urine: 1.011 (ref 1.005–1.030)
pH: 6 (ref 5.0–8.0)

## 2015-09-07 LAB — RETICULOCYTES
RBC.: 3.15 MIL/uL — ABNORMAL LOW (ref 3.87–5.11)
RETIC COUNT ABSOLUTE: 633.2 10*3/uL — AB (ref 19.0–186.0)
RETIC CT PCT: 20.1 % — AB (ref 0.4–3.1)

## 2015-09-07 LAB — PREPARE RBC (CROSSMATCH)

## 2015-09-07 LAB — LIPASE, BLOOD: Lipase: 25 U/L (ref 11–51)

## 2015-09-07 MED ORDER — DIPHENHYDRAMINE HCL 25 MG PO CAPS
25.0000 mg | ORAL_CAPSULE | ORAL | Status: DC | PRN
  Administered 2015-09-11: 25 mg via ORAL
  Filled 2015-09-07: qty 1

## 2015-09-07 MED ORDER — HYDROMORPHONE HCL 2 MG/ML IJ SOLN
2.0000 mg | INTRAMUSCULAR | Status: AC
  Administered 2015-09-07 – 2015-09-08 (×6): 2 mg via INTRAVENOUS
  Filled 2015-09-07 (×6): qty 1

## 2015-09-07 MED ORDER — HYDROMORPHONE HCL 1 MG/ML IJ SOLN
1.0000 mg | INTRAMUSCULAR | Status: AC
  Administered 2015-09-07 (×3): 1 mg via INTRAVENOUS
  Filled 2015-09-07 (×3): qty 1

## 2015-09-07 MED ORDER — SODIUM CHLORIDE 0.9 % IV SOLN
INTRAVENOUS | Status: DC
  Administered 2015-09-07 – 2015-09-09 (×4): via INTRAVENOUS

## 2015-09-07 MED ORDER — NALOXONE HCL 0.4 MG/ML IJ SOLN: 0.4000 mg | INTRAMUSCULAR | Status: DC | PRN

## 2015-09-07 MED ORDER — DEXTROSE 5 % AND 0.45 % NACL IV BOLUS
1000.0000 mL | Freq: Once | INTRAVENOUS | Status: AC
  Administered 2015-09-07: 1000 mL via INTRAVENOUS

## 2015-09-07 MED ORDER — HYDROMORPHONE HCL 2 MG/ML IJ SOLN: 2.5000 mg | Freq: Once | INTRAMUSCULAR | Status: DC

## 2015-09-07 MED ORDER — ONDANSETRON HCL 4 MG/2ML IJ SOLN: 4.0000 mg | Freq: Four times a day (QID) | INTRAMUSCULAR | Status: DC | PRN

## 2015-09-07 MED ORDER — SODIUM CHLORIDE 0.9% FLUSH: 9.0000 mL | INTRAVENOUS | Status: DC | PRN

## 2015-09-07 MED ORDER — PROMETHAZINE HCL 25 MG RE SUPP
25.0000 mg | Freq: Once | RECTAL | Status: DC
  Filled 2015-09-07: qty 1

## 2015-09-07 MED ORDER — CALCIUM CARBONATE ANTACID 500 MG PO CHEW
200.0000 mg | CHEWABLE_TABLET | ORAL | Status: DC | PRN
  Administered 2015-09-07: 200 mg via ORAL
  Filled 2015-09-07: qty 1

## 2015-09-07 MED ORDER — PROMETHAZINE HCL 25 MG PO TABS
25.0000 mg | ORAL_TABLET | Freq: Once | ORAL | Status: AC
  Administered 2015-09-07: 25 mg via ORAL
  Filled 2015-09-07: qty 1

## 2015-09-07 MED ORDER — METOCLOPRAMIDE HCL 5 MG/ML IJ SOLN
10.0000 mg | INTRAMUSCULAR | Status: AC
  Administered 2015-09-07: 10 mg via INTRAVENOUS
  Filled 2015-09-07: qty 2

## 2015-09-07 MED ORDER — METHADONE HCL 5 MG PO TABS
5.0000 mg | ORAL_TABLET | Freq: Every day | ORAL | Status: DC
  Administered 2015-09-07 – 2015-09-13 (×7): 5 mg via ORAL
  Filled 2015-09-07 (×7): qty 1

## 2015-09-07 MED ORDER — ONDANSETRON HCL 4 MG/2ML IJ SOLN
4.0000 mg | INTRAMUSCULAR | Status: DC | PRN
  Administered 2015-09-07: 4 mg via INTRAVENOUS
  Filled 2015-09-07 (×2): qty 2

## 2015-09-07 MED ORDER — PRENATAL MULTIVITAMIN CH
1.0000 | ORAL_TABLET | Freq: Every day | ORAL | Status: DC
  Administered 2015-09-07 – 2015-09-13 (×7): 1 via ORAL
  Filled 2015-09-07 (×8): qty 1

## 2015-09-07 MED ORDER — HYDROMORPHONE HCL 2 MG/ML IJ SOLN
2.0000 mg | Freq: Once | INTRAMUSCULAR | Status: AC
  Administered 2015-09-07: 2 mg via INTRAVENOUS
  Filled 2015-09-07: qty 1

## 2015-09-07 MED ORDER — SENNOSIDES-DOCUSATE SODIUM 8.6-50 MG PO TABS
1.0000 | ORAL_TABLET | Freq: Two times a day (BID) | ORAL | Status: DC
  Administered 2015-09-07 – 2015-09-13 (×13): 1 via ORAL
  Filled 2015-09-07 (×13): qty 1

## 2015-09-07 MED ORDER — POLYETHYLENE GLYCOL 3350 17 G PO PACK: 17.0000 g | PACK | Freq: Every day | ORAL | Status: DC | PRN

## 2015-09-07 MED ORDER — ONDANSETRON 4 MG PO TBDP
4.0000 mg | ORAL_TABLET | Freq: Once | ORAL | Status: AC
  Administered 2015-09-07: 4 mg via ORAL
  Filled 2015-09-07: qty 1

## 2015-09-07 MED ORDER — HYDROMORPHONE 1 MG/ML IV SOLN
INTRAVENOUS | Status: DC
  Administered 2015-09-07: 15:00:00 via INTRAVENOUS
  Administered 2015-09-07: 9.6 mg via INTRAVENOUS
  Administered 2015-09-08: 0.6 mg via INTRAVENOUS
  Administered 2015-09-08: 5.6 mg via INTRAVENOUS
  Administered 2015-09-08: 6 mg via INTRAVENOUS
  Administered 2015-09-08: 4.2 mg via INTRAVENOUS
  Administered 2015-09-08: 5.4 mg via INTRAVENOUS
  Administered 2015-09-08: 10:00:00 via INTRAVENOUS
  Administered 2015-09-08: 5.4 mg via INTRAVENOUS
  Administered 2015-09-08: 7.8 mg via INTRAVENOUS
  Administered 2015-09-09: 7.2 mg via INTRAVENOUS
  Administered 2015-09-09: 5.4 mg via INTRAVENOUS
  Administered 2015-09-09 (×2): via INTRAVENOUS
  Administered 2015-09-09: 7.8 mg via INTRAVENOUS
  Administered 2015-09-09: 2.4 mg via INTRAVENOUS
  Administered 2015-09-09: 5.4 mg via INTRAVENOUS
  Administered 2015-09-10: 0.6 mg via INTRAVENOUS
  Administered 2015-09-10: 19:00:00 via INTRAVENOUS
  Administered 2015-09-10: 5.4 mg via INTRAVENOUS
  Administered 2015-09-10: 7.2 mg via INTRAVENOUS
  Administered 2015-09-10: 6 mg via INTRAVENOUS
  Administered 2015-09-11: 4.8 mg via INTRAVENOUS
  Administered 2015-09-11: 7.8 mg via INTRAVENOUS
  Administered 2015-09-11: 1.8 mg via INTRAVENOUS
  Administered 2015-09-11: 0.6 mg via INTRAVENOUS
  Administered 2015-09-11: 15.6 mg via INTRAVENOUS
  Administered 2015-09-11: 11.18 mg via INTRAVENOUS
  Administered 2015-09-11: 3.6 mg via INTRAVENOUS
  Administered 2015-09-12: 09:00:00 via INTRAVENOUS
  Administered 2015-09-12: 6.6 mg via INTRAVENOUS
  Administered 2015-09-12: 5.39 mg via INTRAVENOUS
  Administered 2015-09-12: 6.6 mg via INTRAVENOUS
  Administered 2015-09-12: 1.8 mg via INTRAVENOUS
  Administered 2015-09-12: 1.4 mg via INTRAVENOUS
  Administered 2015-09-12: 6.6 mg via INTRAVENOUS
  Administered 2015-09-13: 14.39 mg via INTRAVENOUS
  Administered 2015-09-13: 7.5 mg via INTRAVENOUS
  Administered 2015-09-13: 7.8 mg via INTRAVENOUS
  Administered 2015-09-13: 22:00:00 via INTRAVENOUS
  Administered 2015-09-13: 8.4 mg via INTRAVENOUS
  Administered 2015-09-13: 1.8 mg via INTRAVENOUS
  Administered 2015-09-13: 06:00:00 via INTRAVENOUS
  Administered 2015-09-14: 6.6 mg via INTRAVENOUS
  Administered 2015-09-14: 4.8 mg via INTRAVENOUS
  Filled 2015-09-07 (×9): qty 25

## 2015-09-07 MED ORDER — HYDROMORPHONE HCL 1 MG/ML IJ SOLN
1.0000 mg | INTRAMUSCULAR | Status: DC | PRN
  Administered 2015-09-07: 1 mg via INTRAVENOUS
  Filled 2015-09-07: qty 1

## 2015-09-07 MED ORDER — HYDROMORPHONE HCL 2 MG/ML IJ SOLN
2.0000 mg | INTRAMUSCULAR | Status: DC | PRN
  Administered 2015-09-08 – 2015-09-10 (×14): 2 mg via INTRAVENOUS
  Filled 2015-09-07 (×14): qty 1

## 2015-09-07 MED ORDER — PROMETHAZINE HCL 25 MG/ML IJ SOLN
25.0000 mg | Freq: Once | INTRAMUSCULAR | Status: AC
  Administered 2015-09-07: 25 mg via INTRAVENOUS
  Filled 2015-09-07: qty 1

## 2015-09-07 MED ORDER — ENOXAPARIN SODIUM 40 MG/0.4ML ~~LOC~~ SOLN
40.0000 mg | SUBCUTANEOUS | Status: DC
  Administered 2015-09-07 – 2015-09-11 (×4): 40 mg via SUBCUTANEOUS
  Filled 2015-09-07 (×5): qty 0.4

## 2015-09-07 MED ORDER — SODIUM CHLORIDE 0.9 % IV SOLN: Freq: Once | INTRAVENOUS | Status: AC

## 2015-09-07 MED ORDER — DIPHENHYDRAMINE HCL 50 MG/ML IJ SOLN
25.0000 mg | Freq: Once | INTRAMUSCULAR | Status: AC
  Administered 2015-09-07: 25 mg via INTRAVENOUS
  Filled 2015-09-07: qty 1

## 2015-09-07 MED ORDER — SODIUM CHLORIDE 0.9 % IV SOLN
25.0000 mg | INTRAVENOUS | Status: DC | PRN
  Administered 2015-09-07 – 2015-09-13 (×14): 25 mg via INTRAVENOUS
  Filled 2015-09-07 (×30): qty 0.5

## 2015-09-07 MED ORDER — FOLIC ACID 1 MG PO TABS
1.0000 mg | ORAL_TABLET | Freq: Every day | ORAL | Status: DC
  Administered 2015-09-07 – 2015-09-13 (×7): 1 mg via ORAL
  Filled 2015-09-07 (×7): qty 1

## 2015-09-07 MED ORDER — SODIUM CHLORIDE 0.9 % IV BOLUS (SEPSIS)
1000.0000 mL | Freq: Once | INTRAVENOUS | Status: AC
  Administered 2015-09-07: 1000 mL via INTRAVENOUS

## 2015-09-07 NOTE — Progress Notes (Signed)
Note sent via email to [place] about pt admission

## 2015-09-07 NOTE — Progress Notes (Signed)
Entered in d/c instructions  [place] on 10/06/2015 Please go to your scheduled appt 10/06/15 at [place] [phone]

## 2015-09-07 NOTE — ED Notes (Signed)
Patient comes from home with c/o abdominal pain of acute onset this morning.  Patient is actively vomiting on exam and had 2 large formed bowel movements on arrival to ED.  Patient's emesis is bile yellow.  Her abdomen is soft, but tender to palpation.  Skin is warm and dry, patient MAE and appears to feel unwell, but is not in distress.  Patient is on cardiac and pulse oximetry monitoring.

## 2015-09-07 NOTE — Progress Notes (Addendum)
Called to come to [place] ED pt g3p1  30weeks today with vomiting and abdominal pain. NST reasurring, spoke with dr Nehemiah Settle, OB cleared.

## 2015-09-07 NOTE — Care Management Obs Status (Signed)
[place] NOTIFICATION   Patient Details  Name: [name] MRN: [id number] Date of Birth: [birth year]   Medicare Observation Status Notification Given:  Yes (r/o sickle cell crisis)    Robbie Lis, RN 09/07/2015, 12:45 PM

## 2015-09-07 NOTE — ED Notes (Signed)
Bed: WA03 Expected date:  Expected time:  Means of arrival:  Comments: EMS abd pain 

## 2015-09-07 NOTE — ED Provider Notes (Signed)
CSN: [id number]     Arrival date & time 09/07/15  0741 History   First MD Initiated Contact with Patient 09/07/15 [phone]     Chief Complaint  Patient presents with  . Abdominal Pain     (Consider location/radiation/quality/duration/timing/severity/associated sxs/prior Treatment) Patient is a 25 y.o. female presenting with abdominal pain. The history is provided by the patient and medical records. The history is limited by the condition of the patient (Pt actively vomiting/dry heaving).  Abdominal Pain   [name] is a 25 year old female, [redacted] weeks gestation, [id number], with sickle cell disease, chronic pain med and methadone use, presents to the ER with generalized abdominal pain, N, severe vomiting, incontinence of bowels, and back pain which began this morning at 530 am.  Pt presents via EMS with multiple vomiting episodes in route, arrived to ED with active vomiting with bilious emesis.  Abdominal pain is generalized.  Pt denies vaginal bleed, vaginal discharge, she states she feels fetal movement, denies contractions.  She complains of upper back pain, worse with inspiration, she denies SOB, cough.    OB History    Gravida Para Term Preterm AB TAB SAB Ectopic Multiple Living   3 1 1  1  1   0 1      Obstetric Comments   Miscarried in October 2012 at about 7 weeks       Past Medical History  Diagnosis Date  . Miscarriage 03/22/2011    Pt reports 2 miscarriages.  . Depression 01/06/2011  . GERD (gastroesophageal reflux disease) 02/17/2011  . Trichotillomania     h/o  . Blood transfusion     "lots"  . Sickle cell anemia with crisis ([place])   . Exertional dyspnea     "sometimes"  . Migraines 11/08/11    "@ least twice/month"  . Chronic back pain     "very severe; have knot in my back; from tight muscle; take RX and exercise for it"  . Mood swings ([place]) 11/08/11    "I go back and forth; real bad"  . Sickle cell anemia ([name])   . Blood transfusion without reported diagnosis     Past Surgical History  Procedure Laterality Date  . Cholecystectomy  05/2010  . Dilation and curettage of uterus  02/20/11    S/P miscarriage   Family History  Problem Relation Age of Onset  . Diabetes Maternal Grandmother   . Diabetes Paternal Grandmother   . Hypertension Paternal Grandmother   . Diabetes Maternal Grandfather   . Sickle cell trait Mother   . Sickle cell trait Father    Social History  Substance Use Topics  . Smoking status: Former Smoker -- 0.25 packs/day for 1 years    Types: Cigarettes    Quit date: 03/25/2013  . Smokeless tobacco: Never Used  . Alcohol Use: No     Comment: pt states she quit marijuan in May 2013. Rare ETOH, + cigarettes.  She is enrolled in school   OB History    Gravida Para Term Preterm AB TAB SAB Ectopic Multiple Living   3 1 1  1  1   0 1      Obstetric Comments   Miscarried in October 2012 at about 7 weeks     Review of Systems  Gastrointestinal: Positive for abdominal pain.      Allergies  Carrot; Carrot oil; and Latex  Home Medications   Prior to Admission medications   Medication Sig Start Date End Date Taking? Authorizing Provider  acetaminophen (TYLENOL) 500 MG tablet Take 1,000 mg by mouth daily as needed for moderate pain or headache.   Yes Historical Provider, MD  folic acid (FOLVITE) 1 MG tablet Take 1 mg by mouth daily.   Yes Historical Provider, MD  methadone (DOLOPHINE) 5 MG tablet Take 1 tablet (5 mg total) by mouth at bedtime. 09/01/15  Yes Olugbemiga Essie Christine, MD  oxyCODONE (ROXICODONE) 15 MG immediate release tablet Take 1 tablet (15 mg total) by mouth every 4 (four) hours as needed for pain. 09/01/15  Yes Tresa Garter, MD  Prenatal Vit-Min-FA-Fish Oil ([employer] PRENATAL GUMMY) 0.4-113.5 MG CHEW Chew 2-3 each by mouth daily.   Yes Historical Provider, MD   BP 97/61 mmHg  Pulse 102  Temp(Src) 98.6 F (37 C) (Oral)  Resp 18  Wt 79.379 kg  SpO2 95%  LMP 02/09/2015 Physical Exam  Constitutional: She is  oriented to person, place, and time. She appears well-developed and well-nourished. She appears distressed.  Pt appears ill, diaphoretic, actively vomiting bilious material, appears uncomfortable and tired, answering some questions with grunts and 1-2 word answers  HENT:  Head: Normocephalic and atraumatic.  Nose: Nose normal.  Oral mucosa dry, patient drooling bile  Eyes: Conjunctivae and EOM are normal. Pupils are equal, round, and reactive to light. Right eye exhibits no discharge. Left eye exhibits no discharge. No scleral icterus.  Neck: Normal range of motion. Neck supple.  Cardiovascular: Regular rhythm.   Tachycardic, regular, no murmur, gallop or rub auscultated, radial pulses 2+ dorsal pedis pulses 2+  Pulmonary/Chest: Effort normal. No stridor. No respiratory distress. She has no wheezes. She has no rales. She exhibits no tenderness.  Diminished breath sounds bilaterally the bases, no respiratory distress  Abdominal: There is tenderness. There is no rebound and no guarding.  Generalized abdominal tenderness to palpation, no contractions palpated   Musculoskeletal: She exhibits tenderness.  Diffuse TTP to the thoracic back, no step-offs palpated from cervical to lumbar spine  Neurological: She is alert and oriented to person, place, and time. She exhibits normal muscle tone. Coordination normal.  Skin: Skin is warm. No rash noted. She is diaphoretic. No erythema. No pallor.  Psychiatric: She has a normal mood and affect. Judgment and thought content normal.  Nursing note and vitals reviewed.   ED Course  Procedures (including critical care time) Labs Review Labs Reviewed  CBC WITH DIFFERENTIAL/PLATELET - Abnormal; Notable for the following:    WBC 18.3 (*)    RBC 3.15 (*)    Hemoglobin 9.8 (*)    HCT 28.4 (*)    RDW 19.4 (*)    Platelets 507 (*)    Neutro Abs 16.1 (*)    Monocytes Absolute 1.2 (*)    All other components within normal limits  COMPREHENSIVE METABOLIC  PANEL - Abnormal; Notable for the following:    CO2 20 (*)    Glucose, Bld 109 (*)    BUN <5 (*)    Calcium 8.8 (*)    Total Bilirubin 2.8 (*)    All other components within normal limits  RETICULOCYTES - Abnormal; Notable for the following:    Retic Ct Pct 20.1 (*)    RBC. 3.15 (*)    Retic Count, Manual 633.2 (*)    All other components within normal limits  URINE CULTURE  LIPASE, BLOOD  URINALYSIS, ROUTINE W REFLEX MICROSCOPIC (NOT AT [facility])  TYPE AND SCREEN    Imaging Review Dg Chest 2 View  09/07/2015  CLINICAL DATA:  Abdominal pain starting 5:30 this morning, history of sickle cell disease, [redacted] weeks pregnant EXAM: CHEST  2 VIEW COMPARISON:  08/11/2015 FINDINGS: Cardiomediastinal silhouette is stable. No acute infiltrate or pleural effusion. No pulmonary edema. Stable pleural parenchymal scarring in right upper lobe. Stable small calcified granulomas. Stable chronic sclerotic changes bilateral humeral heads consistent with sickle cell sequela. IMPRESSION: No acute infiltrate or pleural effusion. No pulmonary edema. Stable pleural parenchymal scarring in right upper lobe. Stable small calcified granulomas. Electronically Signed   By: Lahoma Crocker M.D.   On: 09/07/2015 09:24   I have personally reviewed and evaluated these images and lab results as part of my medical decision-making.   EKG Interpretation None      MDM   [redacted] week pregnant patient with sickle cell disease, presents emergency Department with sudden onset of abdominal pain and back pain associated with nausea and vomiting.  She was recently discharged, 2 weeks ago for similar complaint. She states she is not received blood transfusions since being in the hospital.  She denies fever, chest pain, shortness of breath.  8:55 AM - Nelta Numbers, RN Rapid OB states NST consistent with pt's baseline, slightly decreased reactivity secondary to chronic narcotic use, no decels, no bradycardia OB/GYN staff aware of pt in [facility]  ED, will watch for admission.  Suspect pt will be admitted.   Expand All Collapse All   Called to come to [name] ED pt g3p1 30weeks today with vomiting and abdominal pain. NST reasurring, spoke with dr Nehemiah Settle, OB cleared.           11:48 AM Patient workup significant for leukocytosis of 18.3, reticulocyte count 20.1, hemoglobin and 9.8, up from most recent, however may be artificially elevated secondary to dehydration. CXR w/o acute pathology, type and screen obtained, lipase negative. Patient has continued to vomit throughout her time in the ER, she presented tachycardic, blood pressure stable, she is arousable but appears sleepy. After 3 doses of IV pain medication with Benadryl and Reglan and Zofran patient continues to be in pain and vomiting. Phenergan given with increased Dilaudid dose.   Case discussed with Dr. Billy Fischer, who has also seen and evaluated pt.  Consult requested to Sickle Cell Medicine @ 10:25, will follow up as I have not yet received a call back 11:53 AM  Plan to admit patient for abd, N, V, loose stool today and dehydration.  Abdominal pain generalized w/o radiation, suspect viral illness, as I had no suspicion for cholecystitis, acute appendicitis or emergent OB/GYN concerns.  Urinalysis not yet resulted, UA with culture ordered.  Will watch for results as pt will need tx if she has a UTI.   Dr. Zigmund Daniel to admit to med/surg.  OB aware of pt. Dr. Zigmund Daniel requested 1L NS to hydrate, ordered.    Final diagnoses:  Bilious vomiting with nausea  Generalized abdominal pain     Delsa Grana, PA-C 09/07/15 [place], MD 09/09/15 1652  Gareth Morgan, MD 09/09/15 1654

## 2015-09-07 NOTE — ED Notes (Signed)
Per EMS - patient is [redacted] weeks pregnant and comes from home with c/o abdominal pain which started around 5:30 this morning.  The pain woke patient from her sleep.  Patient also began vomiting with onset of pain.  Patient denies vaginal discharge.  Patient feels fetus moving.  Patient has hx of [place] disease.  Patient did not receive fluids or anti-emetics.  Patient's vitals 110/80, HR 100 and 98% on RA.  CBG was 117.

## 2015-09-07 NOTE — H&P (Signed)
Hospital Admission Note Date: 09/07/2015  Patient name: [name] Medical record number: [id number] Date of birth: May 05, 1991 Age: 25 y.o. Gender: female PCP: Angelica Chessman, MD  Attending physician: [name], MD  Chief Complaint:Nausea, Vomiting and Diarrhea for 6 hours  History of Present Illness:This is an opiate tolerant patient with Hb SS who presents to the ED with co Nausea, vomiting and diarrhea since this morning. She reports that she's had about 15 episodes of emesis and about 2 episodes of diarrhea. She also reports pain in the abdomen and upper left shoulder region. She rates pain as 8/10 and describes it as throbbing and occasionally sharp. She has been seen by Ob-rapid response team per evaluation protocol from Obstetric service and is okay for admission to [facility] hospital. In the ED she received 3 doses of IV Dilaudid without any relief of pain. She is unable to receive NSAID's due to pregnancy. Her urine is negative for elements consistent with UTI.   She denies any fever, chills, contractions. Focal weakness, headaches or neck stiffness. IO am asked to admit her for dehydration and sickle cell crisis.  Scheduled Meds: .  HYDROmorphone (DILAUDID) injection  2.5 mg Intravenous Once   Continuous Infusions:  PRN Meds:.ondansetron Allergies: Carrot; Carrot oil; and Latex Past Medical History  Diagnosis Date  . Miscarriage 03/22/2011    Pt reports 2 miscarriages.  . Depression 01/06/2011  . GERD (gastroesophageal reflux disease) 02/17/2011  . Trichotillomania     h/o  . Blood transfusion     "lots"  . Sickle cell anemia with crisis ([place])   . Exertional dyspnea     "sometimes"  . Migraines 11/08/11    "@ least twice/month"  . Chronic back pain     "very severe; have knot in my back; from tight muscle; take RX and exercise for it"  . Mood swings ([place]) 11/08/11    "I go back and forth; real bad"  . Sickle cell anemia ([name])   . Blood transfusion without  reported diagnosis    Past Surgical History  Procedure Laterality Date  . Cholecystectomy  05/2010  . Dilation and curettage of uterus  02/20/11    S/P miscarriage   Family History  Problem Relation Age of Onset  . Diabetes Maternal Grandmother   . Diabetes Paternal Grandmother   . Hypertension Paternal Grandmother   . Diabetes Maternal Grandfather   . Sickle cell trait Mother   . Sickle cell trait Father    Social History   Social History  . Marital Status: Single    Spouse Name: N/A  . Number of Children: N/A  . Years of Education: N/A   Occupational History  . Not on file.   Social History Main Topics  . Smoking status: Former Smoker -- 0.25 packs/day for 1 years    Types: Cigarettes    Quit date: 03/25/2013  . Smokeless tobacco: Never Used  . Alcohol Use: No     Comment: pt states she quit marijuan in May 2013. Rare ETOH, + cigarettes.  She is enrolled in school  . Drug Use: No  . Sexual Activity: Yes    Birth Control/ Protection: None     Comment: last sex Jul 11 2015   Other Topics Concern  . 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  Physical Exam: No intake or output data in the 24 hours ending 09/07/15 1349 General: Alert, awake, oriented x3, in moderately acute distress.  HEENT: [place]/AT PEERL, EOMI, anicteric. Neck: Trachea midline,  no masses, no thyromegal,y no JVD, no carotid bruit OROPHARYNX:  Mucosa dry, No exudate/ erythema/lesions.  Heart: Regular rate and rhythm, without murmurs, rubs, gallops, PMI non-displaced, no heaves or thrills on palpation.  Lungs: Clear to auscultation, no wheezing or rhonchi noted. No increased vocal fremitus resonant to percussion  Abdomen: Gravid.   Neuro: No focal neurological deficits noted cranial nerves II through  XII grossly intact. DTRs 2+ bilaterally upper and lower extremities. Strength at functional baseline in bilateral upper and lower extremities. Musculoskeletal: No warm swelling or erythema around joints, no spinal tenderness noted. Psychiatric: Patient alert and oriented x3, good insight and cognition, good recent to remote recall. Lymph node survey: No cervical axillary or inguinal lymphadenopathy noted.  Lab results:  Recent Labs  09/07/15 0835  NA 138  K 3.5  CL 108  CO2 20*  GLUCOSE 109*  BUN <5*  CREATININE 0.46  CALCIUM 8.8*    Recent Labs  09/07/15 0835  AST 27  ALT 31  ALKPHOS 83  BILITOT 2.8*  PROT 7.4  ALBUMIN 3.7    Recent Labs  09/07/15 0835  LIPASE 25    Recent Labs  09/07/15 0835  WBC 18.3*  NEUTROABS 16.1*  HGB 9.8*  HCT 28.4*  MCV 90.2  PLT 507*   No results for input(s): CKTOTAL, CKMB, CKMBINDEX, TROPONINI in the last 72 hours. Invalid input(s): POCBNP No results for input(s): DDIMER in the last 72 hours. No results for input(s): HGBA1C in the last 72 hours. No results for input(s): CHOL, HDL, LDLCALC, TRIG, CHOLHDL, LDLDIRECT in the last 72 hours. No results for input(s): TSH, T4TOTAL, T3FREE, THYROIDAB in the last 72 hours.  Invalid input(s): FREET3  Recent Labs  09/07/15 0835  RETICCTPCT 20.1*   Imaging results:  Dg Chest 2 View  09/07/2015  CLINICAL DATA:  Abdominal pain starting 5:30 this morning, history of sickle cell disease, [redacted] weeks pregnant EXAM: CHEST  2 VIEW COMPARISON:  08/11/2015 FINDINGS: Cardiomediastinal silhouette is stable. No acute infiltrate or pleural effusion. No pulmonary edema. Stable pleural parenchymal scarring in right upper lobe. Stable small calcified granulomas. Stable chronic sclerotic changes bilateral humeral heads consistent with sickle cell sequela. IMPRESSION: No acute infiltrate or pleural effusion. No pulmonary edema. Stable pleural parenchymal scarring in right upper lobe. Stable small calcified  granulomas. Electronically Signed   By: Lahoma Crocker M.D.   On: 09/07/2015 09:24   Dg Chest 2 View  08/11/2015  CLINICAL DATA:  Hypoxia. EXAM: CHEST  2 VIEW COMPARISON:  10/25/2014. FINDINGS: No focal infiltrate. Stable cardiomegaly, no pulmonary venous congestion. Right upper lobe pleural parenchymal thickening noted consistent with scarring. No interim change. Stable calcified pulmonary nodules consistent granulomas. No pleural effusion or pneumothorax. No acute bony abnormality . IMPRESSION: No acute cardiopulmonary disease. Stable cardiomegaly. Stable changes of right upper lobe pleural parenchymal scarring and calcified pulmonary nodules consistent with granulomas. Electronically Signed   By: Marcello Moores  Register   On: 08/11/2015 11:07   Ir Fluoro Guide Cv Line Left  08/13/2015  INDICATION: 25 year old pregnant female with sickle cell disease currently and sickle cell crisis. She requires venous access for hydration and pain control. Prior attempts at right-sided PICC placement or unsuccessful. Left-sided placement will be attempted. EXAM:  PICC LINE PLACEMENT WITH ULTRASOUND AND FLUOROSCOPIC GUIDANCE MEDICATIONS: None required ANESTHESIA/SEDATION: None FLUOROSCOPY TIME:  Fluoroscopy Time: 1 minutes 36 seconds (19 mGy). COMPLICATIONS: None immediate. PROCEDURE: The patient was advised of the possible risks and complications and agreed to undergo the procedure. The patient was then brought to the angiographic suite for the procedure. The left arm was prepped with chlorhexidine, draped in the usual sterile fashion using maximum barrier technique (cap and mask, sterile gown, sterile gloves, large sterile sheet, hand hygiene and cutaneous antisepsis) and infiltrated locally with 1% Lidocaine. Ultrasound demonstrated patency of the left brachial vein, and this was documented with an image. Under real-time ultrasound guidance, this vein was accessed with a 21 gauge micropuncture needle and image documentation was  performed. A 0.018 wire was introduced in to the vein. Over this, a 6 [place] dual lumen power injectable PICC was advanced to the lower SVC/right atrial junction. Fluoroscopy during the procedure and fluoro spot radiograph confirms appropriate catheter position. The catheter was flushed and covered with a sterile dressing. Catheter length: 40 cm IMPRESSION: Successful left arm power PICC line placement with ultrasound and fluoroscopic guidance. The catheter is ready for use. Electronically Signed   By: Jacqulynn Cadet M.D.   On: 08/13/2015 17:11   Ir US Guide Vasc Access Left  08/13/2015  INDICATION: 25 year old pregnant female with sickle cell disease currently and sickle cell crisis. She requires venous access for hydration and pain control. Prior attempts at right-sided PICC placement or unsuccessful. Left-sided placement will be attempted. EXAM: PICC LINE PLACEMENT WITH ULTRASOUND AND FLUOROSCOPIC GUIDANCE MEDICATIONS: None required ANESTHESIA/SEDATION: None FLUOROSCOPY TIME:  Fluoroscopy Time: 1 minutes 36 seconds (19 mGy). COMPLICATIONS: None immediate. PROCEDURE: The patient was advised of the possible risks and complications and agreed to undergo the procedure. The patient was then brought to the angiographic suite for the procedure. The left arm was prepped with chlorhexidine, draped in the usual sterile fashion using maximum barrier technique (cap and mask, sterile gown, sterile gloves, large sterile sheet, hand hygiene and cutaneous antisepsis) and infiltrated locally with 1% Lidocaine. Ultrasound demonstrated patency of the left brachial vein, and this was documented with an image. Under real-time ultrasound guidance, this vein was accessed with a 21 gauge micropuncture needle and image documentation was performed. A 0.018 wire was introduced in to the vein. Over this, a 6 [place] dual lumen power injectable PICC was advanced to the lower SVC/right atrial junction. Fluoroscopy during the procedure  and fluoro spot radiograph confirms appropriate catheter position. The catheter was flushed and covered with a sterile dressing. Catheter length: 40 cm IMPRESSION: Successful left arm power PICC line placement with ultrasound and fluoroscopic guidance. The catheter is ready for use. Electronically Signed   By: Jacqulynn Cadet M.D.   On: 08/13/2015 17:11   [place] [facility] Ob Follow Up  08/18/2015  OBSTETRICAL ULTRASOUND: This exam was performed within a [facility] Ul[facility]ound Department. The OB US report was generated in the [facility] system, and faxed to the ordering physician.  This report is available in the [facility]. See the [facility] Obstetric US report via the Image Link.    Assessment and Plan: 1. Nausea and Vomiting: Etiology is unclear. This may be a viral illness or maybe secondary to crisis in the GI region. Nevertheless, she has no obvious source. Will treat supportively and observe clinical course. Advance diet as tolerated. 2. Diarrhea: She has had only 2 diarrheal stools in the last 24 hours. If diarrhea intensifies will obtain c.diff.  3. Dehydration: Pt is clinically dehydrated and this may be contributing to her pain. Although a hypotonic fluid is appropriate for treating SCD. She is volume depleted so will give isotonic fluids until volume replaced. 4. Hb SS with crisis: Crisis likely triggered by GI symptoms. Will treat pain with Dilaudid via PCA. Pt unable to receive NSAID's due to state of pregnancy. 5. 3rd Trimester Pregnancy: Pt to be evaluated on a daily basis by Ob-Gyn rapid response team as an extension of [place]. She was scheduled today for a partial exchange transfusion as a supportive measure for pregnancy in Hb SS. However due to her crisis she was unable to receive procedure. I have spoken with Dr. Doreene Burke her PMD who has requested exchange be done while hospitalized. It is very likely that she will require partial exchange transfusion as the high Hb S in pregnancy leads to worse crisis  and risk of IUGR in fetus,  6. Leukocytosis: Likely related to crisis, hemo-concentration and stress state.  7. DVT Prophylaxis: Lovenox daily.  Josselin Gaulin A. 09/07/2015, 1:49 PM

## 2015-09-07 NOTE — ED Notes (Signed)
Pt actively vomitting

## 2015-09-08 ENCOUNTER — Encounter (HOSPITAL_COMMUNITY): Payer: Medicare Other

## 2015-09-08 DIAGNOSIS — Z331 Pregnant state, incidental: Secondary | ICD-10-CM

## 2015-09-08 LAB — CBC WITH DIFFERENTIAL/PLATELET
Basophils Absolute: 0 K/uL (ref 0.0–0.1)
Basophils Relative: 0 %
Eosinophils Absolute: 0.1 K/uL (ref 0.0–0.7)
Eosinophils Relative: 1 %
HCT: 23.4 % — ABNORMAL LOW (ref 36.0–46.0)
Hemoglobin: 8.2 g/dL — ABNORMAL LOW (ref 12.0–15.0)
Lymphocytes Relative: 9 %
Lymphs Abs: 1.6 K/uL (ref 0.7–4.0)
MCH: 30.9 pg (ref 26.0–34.0)
MCHC: 35 g/dL (ref 30.0–36.0)
MCV: 88.3 fL (ref 78.0–100.0)
Monocytes Absolute: 1.8 K/uL — ABNORMAL HIGH (ref 0.1–1.0)
Monocytes Relative: 10 %
Neutro Abs: 14.8 K/uL — ABNORMAL HIGH (ref 1.7–7.7)
Neutrophils Relative %: 80 %
Platelets: 386 K/uL (ref 150–400)
RBC: 2.65 MIL/uL — ABNORMAL LOW (ref 3.87–5.11)
RDW: 19.2 % — ABNORMAL HIGH (ref 11.5–15.5)
WBC: 18.3 K/uL — ABNORMAL HIGH (ref 4.0–10.5)

## 2015-09-08 LAB — BASIC METABOLIC PANEL
Anion gap: 8 (ref 5–15)
BUN: <5 mg/dL — ABNORMAL LOW (ref 6–20)
CHLORIDE: 111 mmol/L (ref 101–111)
CO2: 20 mmol/L — AB (ref 22–32)
Calcium: 8.6 mg/dL — ABNORMAL LOW (ref 8.9–10.3)
Creatinine, Ser: 0.5 mg/dL (ref 0.44–1.00)
GFR calc non Af Amer: >60 mL/min (ref >60)
Glucose, Bld: 98 mg/dL (ref 65–99)
POTASSIUM: 3.7 mmol/L (ref 3.5–5.1)
SODIUM: 139 mmol/L (ref 135–145)

## 2015-09-08 IMAGING — CR DG CHEST 1V
1 series · 1 of 1 positions shown · non-contrast
Comparison: Chest radiograph performed 09/20/2014

CLINICAL DATA: Acute onset of shortness of breath and tachycardia.
Initial encounter.

EXAM:
CHEST  1 VIEW

[view not recorded]
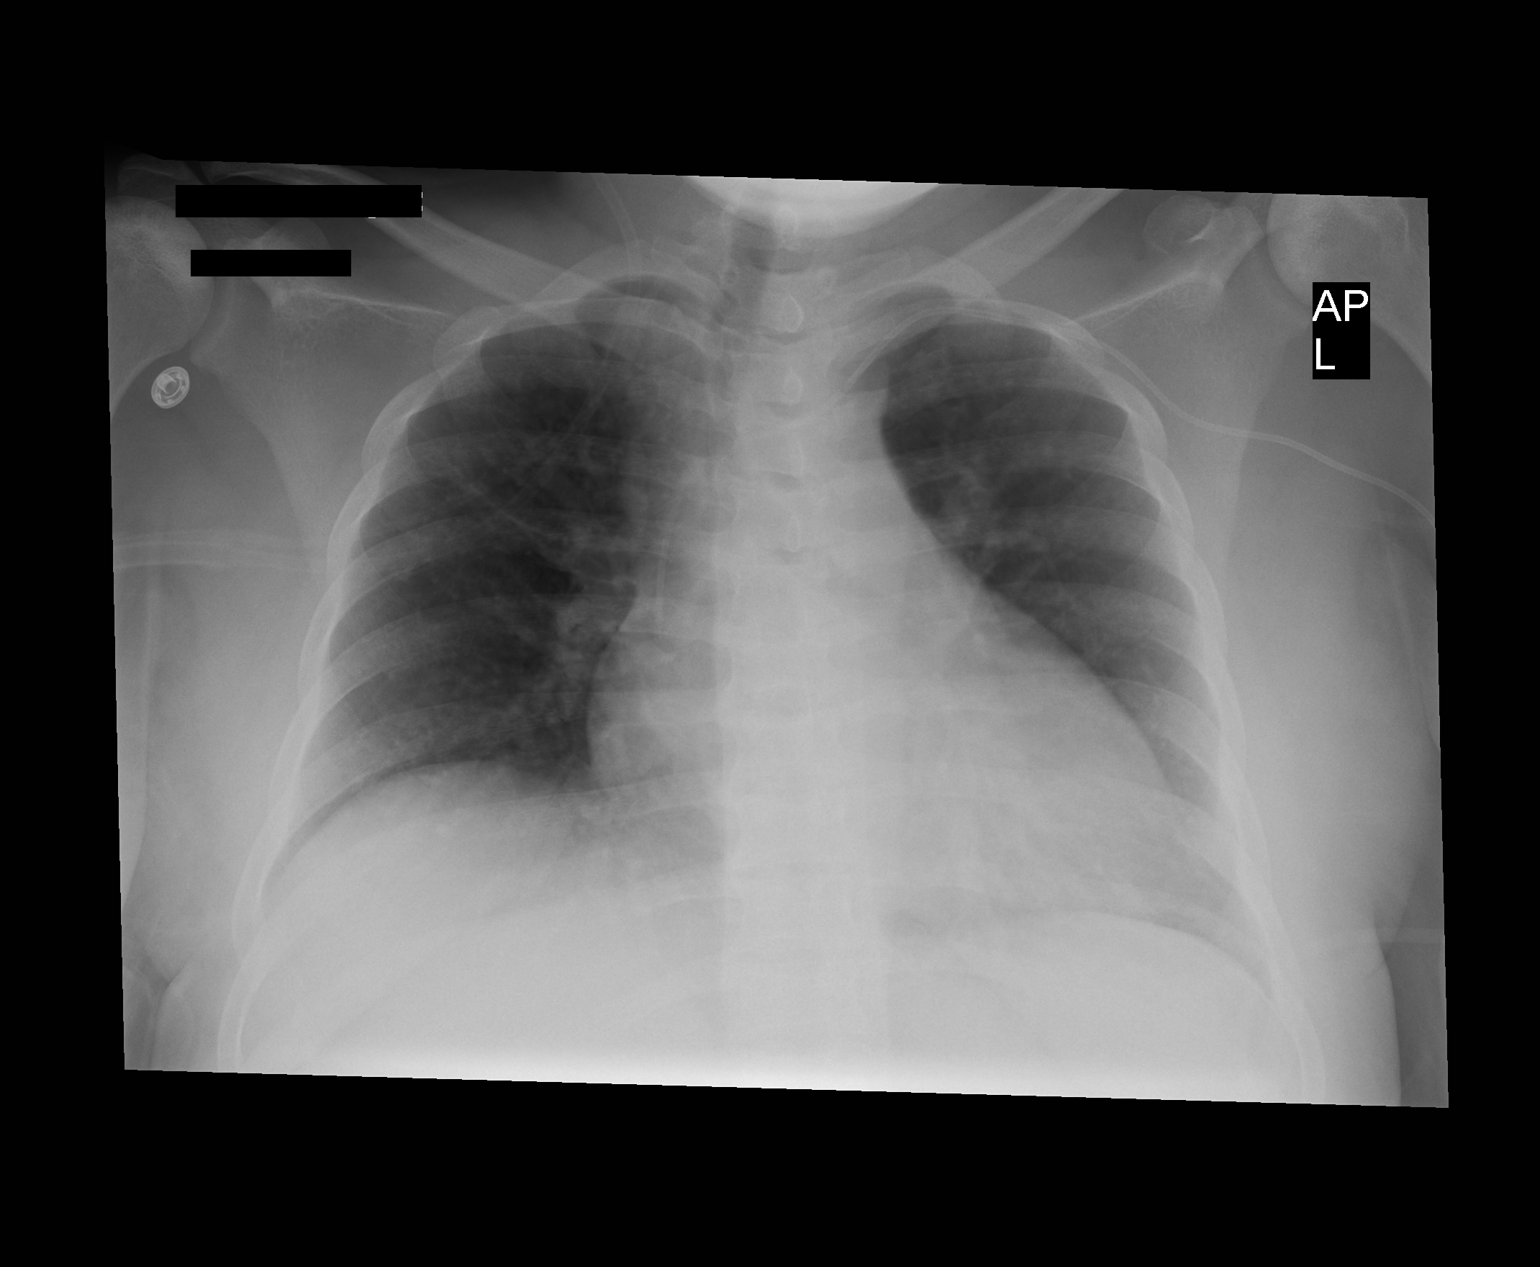

[1 of 1 positions shown; findings below may reference images not displayed]

FINDINGS: The patient's left PICC is noted ending about the distal SVC.

The lungs are well-aerated and clear. There is no evidence of focal
opacification, pleural effusion or pneumothorax.

The cardiomediastinal silhouette is borderline enlarged. No acute
osseous abnormalities are seen.
IMPRESSION: Borderline cardiomegaly; lungs remain grossly clear.

## 2015-09-08 NOTE — Progress Notes (Signed)
SICKLE CELL SERVICE PROGRESS NOTE  [name] [id number] DOB: 05-08-1991 DOA: 09/07/2015 PCP: Angelica Chessman, MD  Assessment/Plan: Active Problems:   Nausea and vomiting   Dehydration  1. Nausea, Vomiting and Diarrhea: Pt has had no further vomiting and is tolerating her diet well. She does still have nausea which is likely associated with opiates. She has had no further diarrhea. 2. Dehydration: resolved with IV hydration. 3. Hb SS with crisis: Pt reports that she is feeling better today and rates her pain at 8/10 as compared to 10/10 on admission. Will continue current dosing of medication. Will also consider partial exchange transfusion as a high % of Hb S will divert oxygen flow to fetus and away from mother causing prolonged and more intense pain crises and ischemia. 4. Leukocytosis: No evidence of infection. Will continue to monitor. 5. 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 6. Chronic pain: Continue Methadone 7. 3rd Trimester Pregnancy: Appreciate RROB team attention. Will continue to monitor pregnancy and fetus. Last electrophoresis shows % of Hb S >30 % so will consider partial exchange transfusion to support fetal growth and decrease complications of pregnancy.  Code Status: Full Code Family Communication: N/A Disposition Plan: Not yet ready for discharge  [place].  Pager [phone]. If 7PM-7AM, please contact night-coverage.  09/08/2015, 5:39 PM  LOS: 1 day   Interim History: Nausea/vomiting and diarrhea resolved. Rates pain 8/10 and localized top back and abdomen. Pt also c/o pain in the epigastric region Which she states is likel "heartburn".   Consultants:  RROB team  Procedures:  None  Antibiotics:  None   Objective: Filed Vitals:   09/08/15 0944 09/08/15 1202 09/08/15 1325 09/08/15 1646  BP: 103/71  95/47   Pulse: 105  101   Temp: 98.1 F (36.7 C)  97.8 F (36.6 C)    TempSrc: Oral  Oral   Resp: 15 12 11 16   Weight:      SpO2: 98% 98% 97% 98%   Weight change:   Intake/Output Summary (Last 24 hours) at 09/08/15 1739 Last data filed at 09/08/15 1326  Gross per 24 hour  Intake   1420 ml  Output      0 ml  Net   1420 ml    General: Alert, awake, oriented x3, in no acute distress.  HEENT: [place]/AT PEERL, EOMI, anicteric Neck: Trachea midline,  no masses, no thyromegal,y no JVD, no carotid bruit OROPHARYNX:  Moist, No exudate/ erythema/lesions.  Heart: Regular rate and rhythm, without murmurs, rubs, gallops, PMI non-displaced, no heaves or thrills on palpation.  Lungs: Clear to auscultation, no wheezing or rhonchi noted. No increased vocal fremitus resonant to percussion  Abdomen: Soft, nontender, nondistended, positive bowel sounds, no masses no hepatosplenomegaly noted..  Neuro: No focal neurological deficits noted cranial nerves II through XII grossly intact.  Strength at functional baseline in bilateral upper and lower extremities. Musculoskeletal: No warm swelling or erythema around joints, no spinal tenderness noted. Psychiatric: Patient alert and oriented x3, good insight and cognition, good recent to remote recall.    Data Reviewed: Basic Metabolic Panel:  Recent Labs Lab 09/07/15 0835 09/08/15 0409  NA 138 139  K 3.5 3.7  CL 108 111  CO2 20* 20*  GLUCOSE 109* 98  BUN <5* <5*  CREATININE 0.46 0.50  CALCIUM 8.8* 8.6*   Liver Function Tests:  Recent Labs Lab 09/07/15 0835  AST 27  ALT 31  ALKPHOS 83  BILITOT 2.8*  PROT 7.4  ALBUMIN 3.7    Recent Labs Lab 09/07/15 0835  LIPASE 25   No results for input(s): AMMONIA in the last 168 hours. CBC:  Recent Labs Lab 09/07/15 0835 09/08/15 0409  WBC 18.3* 18.3*  NEUTROABS 16.1* 14.8*  HGB 9.8* 8.2*  HCT 28.4* 23.4*  MCV 90.2 88.3  PLT 507* 386   Cardiac Enzymes: No results for input(s): CKTOTAL, CKMB, CKMBINDEX, TROPONINI in the last 168 hours. BNP (last 3  results) No results for input(s): BNP in the last 8760 hours.  ProBNP (last 3 results) No results for input(s): PROBNP in the last 8760 hours.  CBG: No results for input(s): GLUCAP in the last 168 hours.  Recent Results (from the past 240 hour(s))  Urine culture     Status: None (Preliminary result)   Collection Time: 09/07/15  4:16 PM  Result Value Ref Range Status   Specimen Description URINE, CLEAN CATCH  Final   Special Requests NONE  Final   Culture   Final    TOO YOUNG TO READ Performed at [facility]    Report Status PENDING  Incomplete     Studies: Dg Chest 2 View  09/07/2015  CLINICAL DATA:  Abdominal pain starting 5:30 this morning, history of sickle cell disease, [redacted] weeks pregnant EXAM: CHEST  2 VIEW COMPARISON:  08/11/2015 FINDINGS: Cardiomediastinal silhouette is stable. No acute infiltrate or pleural effusion. No pulmonary edema. Stable pleural parenchymal scarring in right upper lobe. Stable small calcified granulomas. Stable chronic sclerotic changes bilateral humeral heads consistent with sickle cell sequela. IMPRESSION: No acute infiltrate or pleural effusion. No pulmonary edema. Stable pleural parenchymal scarring in right upper lobe. Stable small calcified granulomas. Electronically Signed   By: Lahoma Crocker M.D.   On: 09/07/2015 09:24   Dg Chest 2 View  08/11/2015  CLINICAL DATA:  Hypoxia. EXAM: CHEST  2 VIEW COMPARISON:  10/25/2014. FINDINGS: No focal infiltrate. Stable cardiomegaly, no pulmonary venous congestion. Right upper lobe pleural parenchymal thickening noted consistent with scarring. No interim change. Stable calcified pulmonary nodules consistent granulomas. No pleural effusion or pneumothorax. No acute bony abnormality . IMPRESSION: No acute cardiopulmonary disease. Stable cardiomegaly. Stable changes of right upper lobe pleural parenchymal scarring and calcified pulmonary nodules consistent with granulomas. Electronically Signed   By: Marcello Moores   Register   On: 08/11/2015 11:07   Ir Fluoro Guide Cv Line Left  08/13/2015  INDICATION: 25 year old pregnant female with sickle cell disease currently and sickle cell crisis. She requires venous access for hydration and pain control. Prior attempts at right-sided PICC placement or unsuccessful. Left-sided placement will be attempted. EXAM: PICC LINE PLACEMENT WITH ULTRASOUND AND FLUOROSCOPIC GUIDANCE MEDICATIONS: None required ANESTHESIA/SEDATION: None FLUOROSCOPY TIME:  Fluoroscopy Time: 1 minutes 36 seconds (19 mGy). COMPLICATIONS: None immediate. PROCEDURE: The patient was advised of the possible risks and complications and agreed to undergo the procedure. The patient was then brought to the angiographic suite for the procedure. The left arm was prepped with chlorhexidine, draped in the usual sterile fashion using maximum barrier technique (cap and mask, sterile gown, sterile gloves, large sterile sheet, hand hygiene and cutaneous antisepsis) and infiltrated locally with 1% Lidocaine. Ultrasound demonstrated patency of the left brachial vein, and this was documented with an image. Under real-time ultrasound guidance, this vein was accessed with a 21 gauge micropuncture needle and image documentation was performed. A 0.018 wire was introduced in to the vein. Over this, a 6 [place] dual lumen power injectable PICC  was advanced to the lower SVC/right atrial junction. Fluoroscopy during the procedure and fluoro spot radiograph confirms appropriate catheter position. The catheter was flushed and covered with a sterile dressing. Catheter length: 40 cm IMPRESSION: Successful left arm power PICC line placement with ultrasound and fluoroscopic guidance. The catheter is ready for use. Electronically Signed   By: Jacqulynn Cadet M.D.   On: 08/13/2015 17:11   Ir US Guide Vasc Access Left  08/13/2015  INDICATION: 25 year old pregnant female with sickle cell disease currently and sickle cell crisis. She requires venous  access for hydration and pain control. Prior attempts at right-sided PICC placement or unsuccessful. Left-sided placement will be attempted. EXAM: PICC LINE PLACEMENT WITH ULTRASOUND AND FLUOROSCOPIC GUIDANCE MEDICATIONS: None required ANESTHESIA/SEDATION: None FLUOROSCOPY TIME:  Fluoroscopy Time: 1 minutes 36 seconds (19 mGy). COMPLICATIONS: None immediate. PROCEDURE: The patient was advised of the possible risks and complications and agreed to undergo the procedure. The patient was then brought to the angiographic suite for the procedure. The left arm was prepped with chlorhexidine, draped in the usual sterile fashion using maximum barrier technique (cap and mask, sterile gown, sterile gloves, large sterile sheet, hand hygiene and cutaneous antisepsis) and infiltrated locally with 1% Lidocaine. Ultrasound demonstrated patency of the left brachial vein, and this was documented with an image. Under real-time ultrasound guidance, this vein was accessed with a 21 gauge micropuncture needle and image documentation was performed. A 0.018 wire was introduced in to the vein. Over this, a 6 [place] dual lumen power injectable PICC was advanced to the lower SVC/right atrial junction. Fluoroscopy during the procedure and fluoro spot radiograph confirms appropriate catheter position. The catheter was flushed and covered with a sterile dressing. Catheter length: 40 cm IMPRESSION: Successful left arm power PICC line placement with ultrasound and fluoroscopic guidance. The catheter is ready for use. Electronically Signed   By: Jacqulynn Cadet M.D.   On: 08/13/2015 17:11   [place] Mfm Ob Follow Up  08/18/2015  OBSTETRICAL ULTRASOUND: This exam was performed within a [place] [facility]. The OB US report was generated in the AS system, and faxed to the ordering physician.  This report is available in the [facility]. See the AS Obstetric US report via the Image Link.   Scheduled Meds: . sodium chloride    Intravenous Once  . enoxaparin (LOVENOX) injection  40 mg Subcutaneous Q24H  . folic acid  1 mg Oral Daily  . HYDROmorphone   Intravenous Q4H  . methadone  5 mg Oral QHS  . prenatal multivitamin  1 tablet Oral Daily  . senna-docusate  1 tablet Oral BID   Continuous Infusions: . sodium chloride 150 mL/hr at 09/08/15 1448    Time spent 30 minutes.

## 2015-09-08 NOTE — Consult Note (Signed)
   [facility] CM Inpatient Consult   09/08/2015  [name] [birth year] [id number]   Patient is active with [place] [facility] services. Please see chart review tab then notes for patient outreach correspondence from [facility]. Went to bedside to speak with patient. She drifts on and off to sleep during bedside conversation. She is agreeable to [place] Management follow up post discharge. Will make inpatient RNCM aware that [place] Management is following.   Marthenia Rolling, MSN-Ed, RN,BSN [facility] Liaison [phone]

## 2015-09-08 NOTE — Progress Notes (Addendum)
1625 Patient is involved in heated telephone conversation while eating lunch.  She is sitting up and leaning forward and is uncooperative with fetal monitoring.  I informed patient that I would return in 10 min so that she can finish lunch and lean back in bed for monitoring.  89  Dr. Kennon Rounds updated on patient. Notified of EFM results and of patient's doses of dilaudid and benadryl today.  Advised RROB RN to compare today's tracing to yesterday's tracing.  Report of tracing for yesterday is similar.  Fetal movements are palpated by RROB RN and felt frequently by patient.  Patient denies abdominal pain, vaginal bleeding or leaking of fluid.

## 2015-09-09 LAB — URINE CULTURE

## 2015-09-09 IMAGING — CR DG CHEST 2V
2 series · 2 of 2 positions shown · non-contrast
Comparison: Portable examinations 10/24/2014 and 09/20/2014.

CLINICAL DATA: Fever with generalized body aches. Sickle cell
crisis.

EXAM:
CHEST  2 VIEW

[chest lat (1 of 2)]
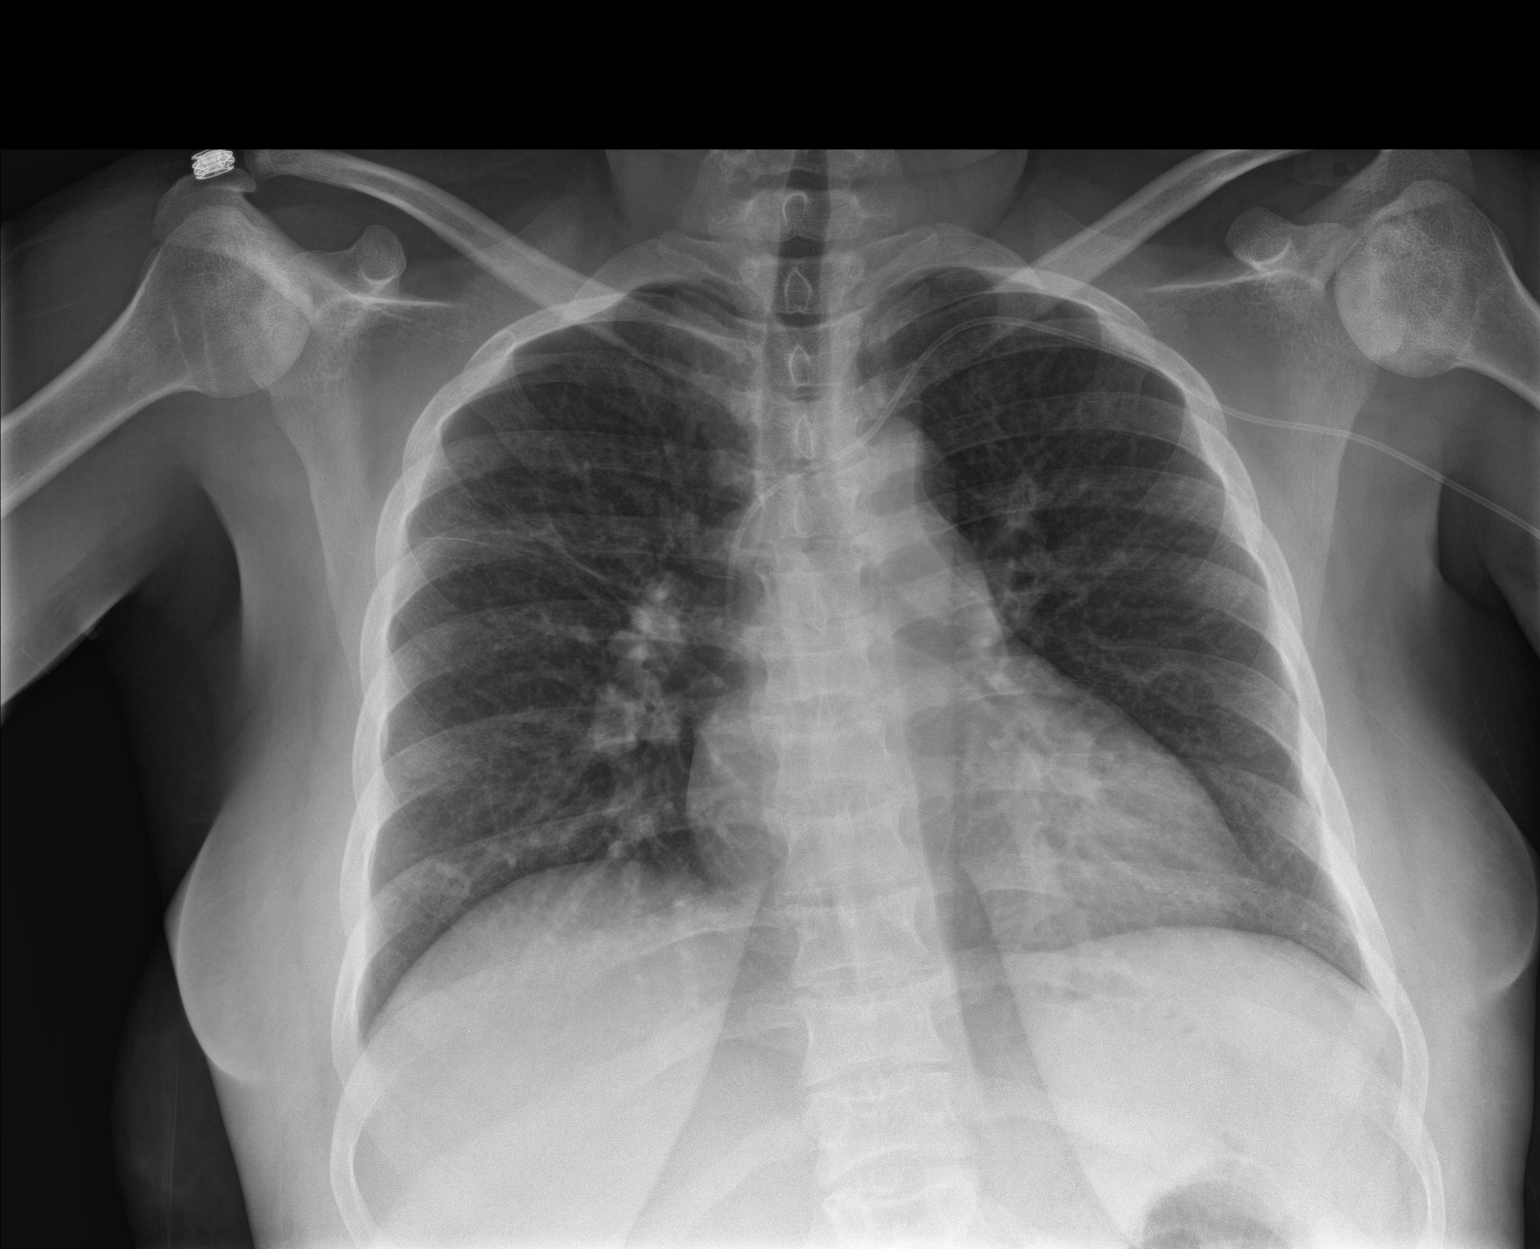

[chest lat (2 of 2)]
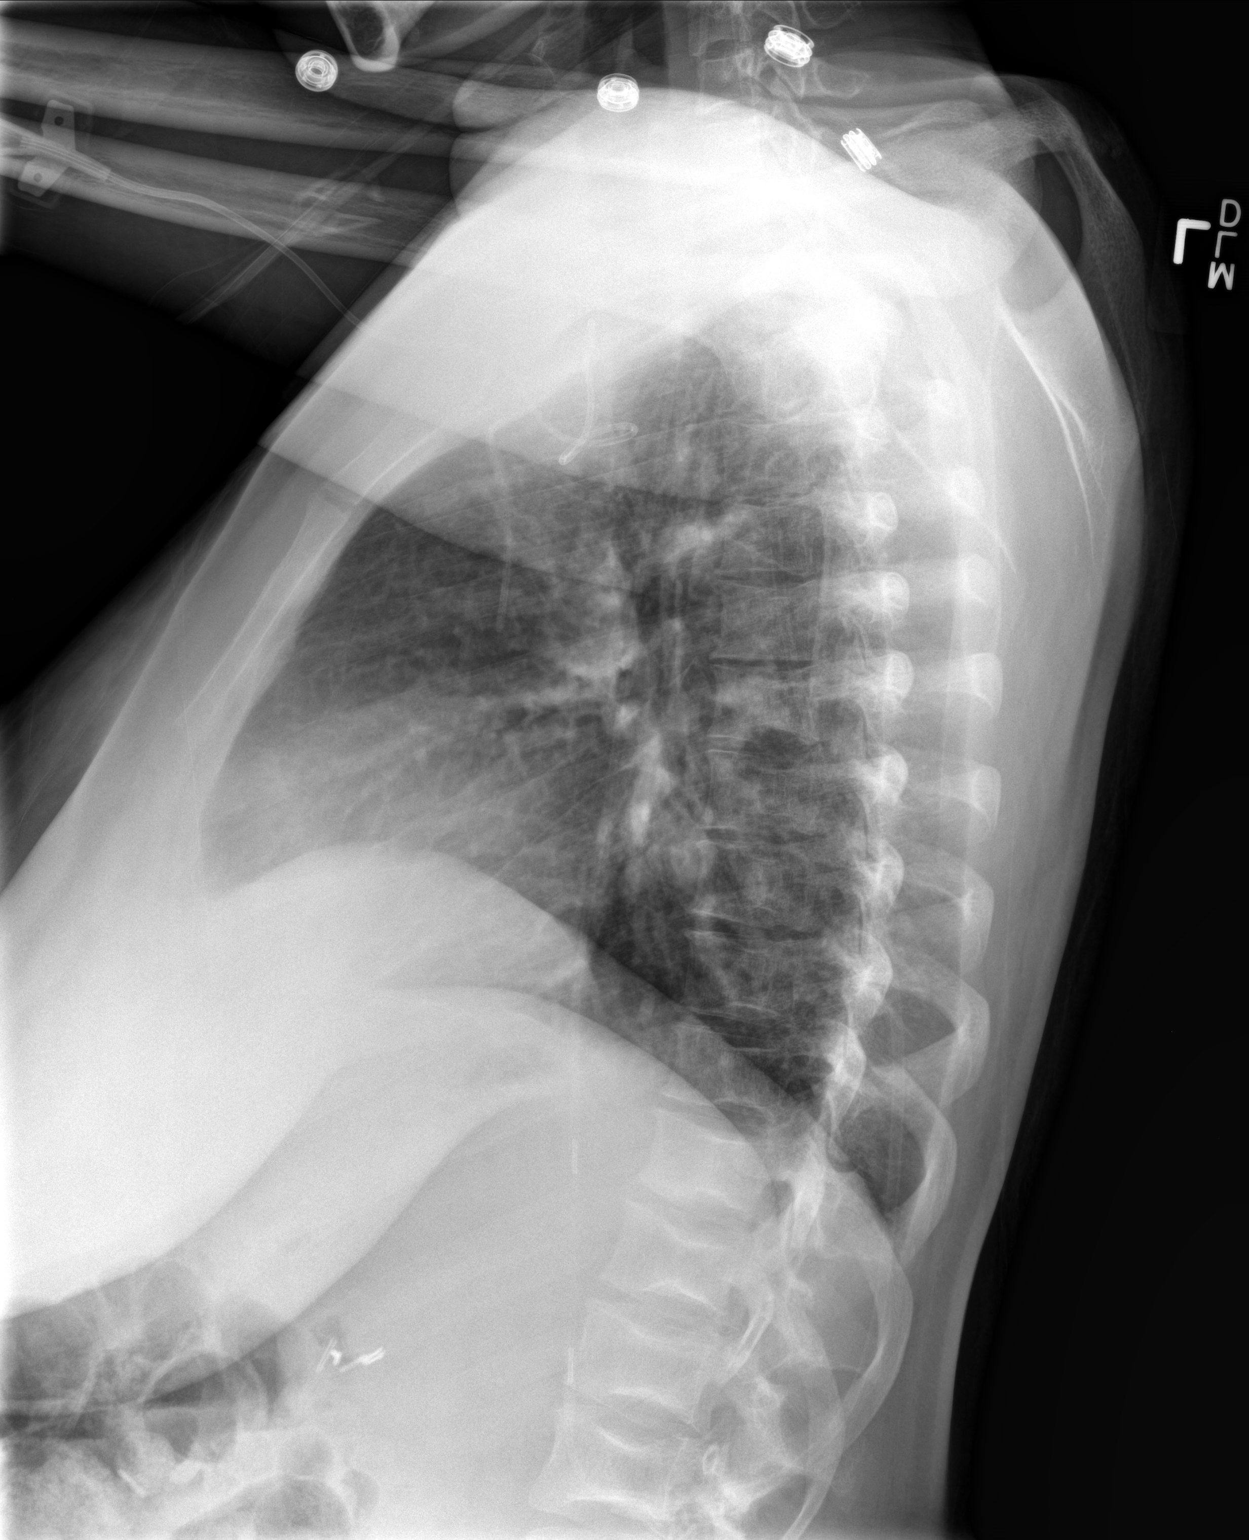

[2 of 2 positions shown; findings below may reference images not displayed]

FINDINGS: Left arm PICC appears unchanged in the lower SVC. The heart size and
mediastinal contours are stable. The lungs appear unchanged with
mild perihilar scarring or atelectasis. There is no edema, confluent
airspace opacity or pleural effusion. Chronic osseous changes of
sickle cell are again noted within the spine and humeral heads.
IMPRESSION: Stable chest.  No acute findings.

## 2015-09-09 MED ORDER — SODIUM CHLORIDE 0.9 % IV SOLN: Freq: Once | INTRAVENOUS | Status: DC

## 2015-09-09 NOTE — Plan of Care (Signed)
RN paged this NP after shift change re: exchange transfusion. IV team saw pt for exchange but stated pt needs central line placement by IR. This NP tried to get in touch with IR on call, [place], without a call back. Exchange transfusion will have to be done in am. Will report situation to attending in am.

## 2015-09-09 NOTE — Progress Notes (Signed)
Here to do NST, tracing reactive and reassuring, called Dr Elly Modena, informed of tracing. No new orders received. PT in good spirits today and was cooperative.

## 2015-09-09 NOTE — Progress Notes (Signed)
SICKLE CELL SERVICE PROGRESS NOTE  CHALET KETCHAM [id number] DOB: [birth year] DOA: 09/07/2015 PCP: Angelica Chessman, MD  Assessment/Plan: Active Problems:   Nausea and vomiting   Dehydration    1. Hb SS with crisis: Pt reports that she is feeling better today and rates her pain at 8/10 as compared to 10/10 on admission. Will continue current dosing of medication. Will also consider partial exchange transfusion as a high % of Hb S will divert oxygen flow to fetus and away from mother causing prolonged and more intense pain crises and ischemia. 2. Anemia of Chronic Disease associated with Hb SS: Hb levels adequate for Hb SS with pregnancy. 3. Leukocytosis: No evidence of infection. Will continue to monitor. 4. Nausea, Vomiting and Diarrhea: Pt has had no further vomiting and is tolerating her diet well. She does still have nausea which is likely associated with opiates. She has had no further diarrhea. 5. Dehydration: resolved with IV hydration. 6. Chronic pain: Continue Methadone 7. 3rd Trimester Pregnancy: Appreciate RROB team attention. Will continue to monitor pregnancy and fetus. Last electrophoresis shows % of Hb S >30 % so will consider partial exchange transfusion to support fetal growth and decrease complications of pregnancy.  Code Status: Full Code Family Communication: N/A Disposition Plan: Not yet ready for discharge  [place].  Pager [phone]. If 7PM-7AM, please contact night-coverage.  09/09/2015, 2:17 PM  LOS: 2 days   Interim History: Nausea/vomiting and diarrhea resolved. Rates pain 7/10 and localized top back and abdomen. Pt also c/o pain in the epigastric region Which she states is likel "heartburn".   Consultants:  RROB team  Procedures:  None  Antibiotics:  None   Objective: Filed Vitals:   09/09/15 0609 09/09/15 0800 09/09/15 1043 09/09/15 1200  BP: 101/60  98/45   Pulse: 105  95   Temp: 97.5 F (36.4 C)  98.3 F (36.8 C)    TempSrc: Oral  Oral   Resp: 15 14 13 14   Height:      Weight:      SpO2: 98% 99% 96% 98%   Weight change:   Intake/Output Summary (Last 24 hours) at 09/09/15 1417 Last data filed at 09/09/15 0610  Gross per 24 hour  Intake    360 ml  Output      0 ml  Net    360 ml    General: Alert, awake, oriented x3, in no acute distress.  HEENT: /AT PEERL, EOMI, anicteric Neck: Trachea midline,  no masses, no thyromegal,y no JVD, no carotid bruit OROPHARYNX:  Moist, No exudate/ erythema/lesions.  Heart: Regular rate and rhythm, without murmurs, rubs, gallops, PMI non-displaced, no heaves or thrills on palpation.  Lungs: Clear to auscultation, no wheezing or rhonchi noted. No increased vocal fremitus resonant to percussion  Abdomen: Soft, nontender, nondistended, positive bowel sounds, no masses no hepatosplenomegaly noted..  Neuro: No focal neurological deficits noted cranial nerves II through XII grossly intact.  Strength at functional baseline in bilateral upper and lower extremities. Musculoskeletal: No warm swelling or erythema around joints, no spinal tenderness noted. Psychiatric: Patient alert and oriented x3, good insight and cognition, good recent to remote recall.    Data Reviewed: Basic Metabolic Panel:  Recent Labs Lab 09/07/15 0835 09/08/15 0409  NA 138 139  K 3.5 3.7  CL 108 111  CO2 20* 20*  GLUCOSE 109* 98  BUN <5* <5*  CREATININE 0.46 0.50  CALCIUM 8.8* 8.6*   Liver Function Tests:  Recent Labs Lab 09/07/15 814 482 4848  AST 27  ALT 31  ALKPHOS 83  BILITOT 2.8*  PROT 7.4  ALBUMIN 3.7    Recent Labs Lab 09/07/15 0835  LIPASE 25   No results for input(s): AMMONIA in the last 168 hours. CBC:  Recent Labs Lab 09/07/15 0835 09/08/15 0409  WBC 18.3* 18.3*  NEUTROABS 16.1* 14.8*  HGB 9.8* 8.2*  HCT 28.4* 23.4*  MCV 90.2 88.3  PLT 507* 386   Cardiac Enzymes: No results for input(s): CKTOTAL, CKMB, CKMBINDEX, TROPONINI in the last 168 hours. BNP  (last 3 results) No results for input(s): BNP in the last 8760 hours.  ProBNP (last 3 results) No results for input(s): PROBNP in the last 8760 hours.  CBG: No results for input(s): GLUCAP in the last 168 hours.  Recent Results (from the past 240 hour(s))  Urine culture     Status: Abnormal   Collection Time: 09/07/15  4:16 PM  Result Value Ref Range Status   Specimen Description URINE, CLEAN CATCH  Final   Special Requests NONE  Final   Culture MULTIPLE SPECIES PRESENT, SUGGEST RECOLLECTION (A)  Final   Report Status 09/09/2015 FINAL  Final     Studies: Dg Chest 2 View  09/07/2015  CLINICAL DATA:  Abdominal pain starting 5:30 this morning, history of sickle cell disease, [redacted] weeks pregnant EXAM: CHEST  2 VIEW COMPARISON:  08/11/2015 FINDINGS: Cardiomediastinal silhouette is stable. No acute infiltrate or pleural effusion. No pulmonary edema. Stable pleural parenchymal scarring in right upper lobe. Stable small calcified granulomas. Stable chronic sclerotic changes bilateral humeral heads consistent with sickle cell sequela. IMPRESSION: No acute infiltrate or pleural effusion. No pulmonary edema. Stable pleural parenchymal scarring in right upper lobe. Stable small calcified granulomas. Electronically Signed   By: Lahoma Crocker M.D.   On: 09/07/2015 09:24   Dg Chest 2 View  08/11/2015  CLINICAL DATA:  Hypoxia. EXAM: CHEST  2 VIEW COMPARISON:  10/25/2014. FINDINGS: No focal infiltrate. Stable cardiomegaly, no pulmonary venous congestion. Right upper lobe pleural parenchymal thickening noted consistent with scarring. No interim change. Stable calcified pulmonary nodules consistent granulomas. No pleural effusion or pneumothorax. No acute bony abnormality . IMPRESSION: No acute cardiopulmonary disease. Stable cardiomegaly. Stable changes of right upper lobe pleural parenchymal scarring and calcified pulmonary nodules consistent with granulomas. Electronically Signed   By: Marcello Moores  Register   On:  08/11/2015 11:07   Ir Fluoro Guide Cv Line Left  08/13/2015  INDICATION: 25 year old pregnant female with sickle cell disease currently and sickle cell crisis. She requires venous access for hydration and pain control. Prior attempts at right-sided PICC placement or unsuccessful. Left-sided placement will be attempted. EXAM: PICC LINE PLACEMENT WITH ULTRASOUND AND FLUOROSCOPIC GUIDANCE MEDICATIONS: None required ANESTHESIA/SEDATION: None FLUOROSCOPY TIME:  Fluoroscopy Time: 1 minutes 36 seconds (19 mGy). COMPLICATIONS: None immediate. PROCEDURE: The patient was advised of the possible risks and complications and agreed to undergo the procedure. The patient was then brought to the angiographic suite for the procedure. The left arm was prepped with chlorhexidine, draped in the usual sterile fashion using maximum barrier technique (cap and mask, sterile gown, sterile gloves, large sterile sheet, hand hygiene and cutaneous antisepsis) and infiltrated locally with 1% Lidocaine. Ultrasound demonstrated patency of the left brachial vein, and this was documented with an image. Under real-time ultrasound guidance, this vein was accessed with a 21 gauge micropuncture needle and image documentation was performed. A 0.018 wire was introduced in to the vein. Over this, a 6 [place] dual lumen power injectable PICC  was advanced to the lower SVC/right atrial junction. Fluoroscopy during the procedure and fluoro spot radiograph confirms appropriate catheter position. The catheter was flushed and covered with a sterile dressing. Catheter length: 40 cm IMPRESSION: Successful left arm power PICC line placement with ultrasound and fluoroscopic guidance. The catheter is ready for use. Electronically Signed   By: Jacqulynn Cadet M.D.   On: 08/13/2015 17:11   Ir US Guide Vasc Access Left  08/13/2015  INDICATION: 25 year old pregnant female with sickle cell disease currently and sickle cell crisis. She requires venous access for  hydration and pain control. Prior attempts at right-sided PICC placement or unsuccessful. Left-sided placement will be attempted. EXAM: PICC LINE PLACEMENT WITH ULTRASOUND AND FLUOROSCOPIC GUIDANCE MEDICATIONS: None required ANESTHESIA/SEDATION: None FLUOROSCOPY TIME:  Fluoroscopy Time: 1 minutes 36 seconds (19 mGy). COMPLICATIONS: None immediate. PROCEDURE: The patient was advised of the possible risks and complications and agreed to undergo the procedure. The patient was then brought to the angiographic suite for the procedure. The left arm was prepped with chlorhexidine, draped in the usual sterile fashion using maximum barrier technique (cap and mask, sterile gown, sterile gloves, large sterile sheet, hand hygiene and cutaneous antisepsis) and infiltrated locally with 1% Lidocaine. Ultrasound demonstrated patency of the left brachial vein, and this was documented with an image. Under real-time ultrasound guidance, this vein was accessed with a 21 gauge micropuncture needle and image documentation was performed. A 0.018 wire was introduced in to the vein. Over this, a 6 [place] dual lumen power injectable PICC was advanced to the lower SVC/right atrial junction. Fluoroscopy during the procedure and fluoro spot radiograph confirms appropriate catheter position. The catheter was flushed and covered with a sterile dressing. Catheter length: 40 cm IMPRESSION: Successful left arm power PICC line placement with ultrasound and fluoroscopic guidance. The catheter is ready for use. Electronically Signed   By: Jacqulynn Cadet M.D.   On: 08/13/2015 17:11   [place] Mfm Ob Follow Up  08/18/2015  OBSTETRICAL ULTRASOUND: This exam was performed within a [place] [facility] Department. The OB US report was generated in the AS system, and faxed to the ordering physician.  This report is available in the [facility]. See the [facility] Obstetric US report via the Image Link.   Scheduled Meds: . sodium chloride   Intravenous Once  .  enoxaparin (LOVENOX) injection  40 mg Subcutaneous Q24H  . folic acid  1 mg Oral Daily  . HYDROmorphone   Intravenous Q4H  . methadone  5 mg Oral QHS  . prenatal multivitamin  1 tablet Oral Daily  . senna-docusate  1 tablet Oral BID   Continuous Infusions: . sodium chloride 10 mL/hr at 09/09/15 0826    Time spent 25 minutes.

## 2015-09-10 ENCOUNTER — Telehealth: Payer: Self-pay

## 2015-09-10 ENCOUNTER — Inpatient Hospital Stay (HOSPITAL_COMMUNITY): Payer: Medicare Other

## 2015-09-10 DIAGNOSIS — D638 Anemia in other chronic diseases classified elsewhere: Secondary | ICD-10-CM

## 2015-09-10 DIAGNOSIS — K5900 Constipation, unspecified: Secondary | ICD-10-CM

## 2015-09-10 LAB — CBC WITH DIFFERENTIAL/PLATELET
BASOS PCT: 0 %
Basophils Absolute: 0 10*3/uL (ref 0.0–0.1)
Eosinophils Absolute: 0.2 10*3/uL (ref 0.0–0.7)
Eosinophils Relative: 1 %
HEMATOCRIT: 23.7 % — AB (ref 36.0–46.0)
Hemoglobin: 8.2 g/dL — ABNORMAL LOW (ref 12.0–15.0)
Lymphocytes Relative: 10 %
Lymphs Abs: 1.7 10*3/uL (ref 0.7–4.0)
MCH: 30.9 pg (ref 26.0–34.0)
MCHC: 34.6 g/dL (ref 30.0–36.0)
MCV: 89.4 fL (ref 78.0–100.0)
MONO ABS: 1.1 10*3/uL — AB (ref 0.1–1.0)
MONOS PCT: 7 %
NEUTROS ABS: 13.4 10*3/uL — AB (ref 1.7–7.7)
Neutrophils Relative %: 82 %
Platelets: 347 10*3/uL (ref 150–400)
RBC: 2.65 MIL/uL — ABNORMAL LOW (ref 3.87–5.11)
RDW: 19.3 % — AB (ref 11.5–15.5)
WBC: 16.4 10*3/uL — ABNORMAL HIGH (ref 4.0–10.5)

## 2015-09-10 MED ORDER — HYDROMORPHONE HCL 2 MG/ML IJ SOLN
2.0000 mg | Freq: Once | INTRAMUSCULAR | Status: AC
  Administered 2015-09-10: 2 mg via INTRAVENOUS
  Filled 2015-09-10: qty 1

## 2015-09-10 MED ORDER — LIDOCAINE HCL 1 % IJ SOLN
INTRAMUSCULAR | Status: DC | PRN
  Administered 2015-09-10: 10 mL

## 2015-09-10 MED ORDER — HYDROMORPHONE HCL 2 MG/ML IJ SOLN
2.0000 mg | INTRAMUSCULAR | Status: DC | PRN
Start: 2015-09-10 — End: 2015-09-11
  Administered 2015-09-10 – 2015-09-11 (×4): 2 mg via INTRAVENOUS
  Filled 2015-09-10 (×5): qty 1

## 2015-09-10 MED ORDER — LIDOCAINE HCL 1 % IJ SOLN
INTRAMUSCULAR | Status: AC
  Filled 2015-09-10: qty 20

## 2015-09-10 NOTE — Progress Notes (Signed)
On call physician had to be paged due complications with drawing back blood  collection before partial blood transfusion. This nurse spiked blood and was ready for transfusion at 2036. 2040 Iv team nurse arrived to floor and began to try to pull blood from pt PICC located in left forearm, but  unfortunately had complications doing so. This nurse contacted charge nurse went over policies and procedures; contacted blood bank for consult; and informed on call hospitalist of situation. On call physician states she will put in new order for blood.

## 2015-09-10 NOTE — Telephone Encounter (Signed)
Refill request for oxycodone. LOV 09/01/2015. Please advise. Thanks!

## 2015-09-10 NOTE — Telephone Encounter (Signed)
Pt called and requested medication refill on Oxycodone, 15mg . Thanks!

## 2015-09-10 NOTE — Progress Notes (Signed)
SICKLE CELL SERVICE PROGRESS NOTE  [name] [id number] DOB: 1991-01-21 DOA: 09/07/2015 PCP: Angelica Chessman, MD  Assessment/Plan: Active Problems:   Nausea and vomiting   Dehydration    1. Hb SS with crisis: Pt reports that she is feeling better today and rates her pain at 7/10 as compared to 8/10 yesterday. Will continue current dosing of medication but will decrease frequency of Clinician assisted doses. Pt awaiting IV access placement to proceed with  partial exchange transfusion as a high % of Hb S will divert oxygen flow to fetus and away from mother causing prolonged and more intense pain crises and ischemia. 2. Anemia of Chronic Disease associated with Hb SS: Hb levels adequate for Hb SS with pregnancy. 3. Leukocytosis: No evidence of infection. Will continue to monitor. 4. Nausea, Vomiting and Diarrhea:Resolved. 5. Dehydration: resolved with IV hydration. 6. Chronic pain: Continue Methadone 7. 3rd Trimester Pregnancy: Appreciate RROB team attention. Will continue to monitor pregnancy and fetus. Last electrophoresis shows % of Hb S >30 % so will consider partial exchange transfusion to support fetal growth and decrease complications of pregnancy.  Code Status: Full Code Family Communication: N/A Disposition Plan: Not yet ready for discharge  [place].  Pager [phone]. If 7PM-7AM, please contact night-coverage.  09/10/2015, 11:37 AM  LOS: 3 days   Interim History: Rates pain 7/10 and localized top back . Last BM  Prior to admission.   Consultants:  RROB team  Procedures:  None  Antibiotics:  None   Objective: Filed Vitals:   09/10/15 0210 09/10/15 0400 09/10/15 0441 09/10/15 0737  BP: 114/65  107/62   Pulse: 97  96   Temp: 98.1 F (36.7 C)  98.6 F (37 C)   TempSrc: Axillary  Oral   Resp: 16 16 16 14   Height:      Weight:      SpO2: 96% 99% 96% 96%   Weight change:   Intake/Output Summary (Last 24 hours) at 09/10/15 1137 Last  data filed at 09/09/15 1835  Gross per 24 hour  Intake    360 ml  Output      0 ml  Net    360 ml    General: Alert, awake, oriented x3, in no acute distress.  HEENT: [place]/AT PEERL, EOMI, anicteric Neck: Trachea midline,  no masses, no thyromegal,y no JVD, no carotid bruit OROPHARYNX:  Moist, No exudate/ erythema/lesions.  Heart: Regular rate and rhythm, without murmurs, rubs, gallops, PMI non-displaced, no heaves or thrills on palpation.  Lungs: Clear to auscultation, no wheezing or rhonchi noted. No increased vocal fremitus resonant to percussion  Abdomen: Soft, nontender, nondistended, positive bowel sounds, no masses no hepatosplenomegaly noted..  Neuro: No focal neurological deficits noted cranial nerves II through XII grossly intact.  Strength at functional baseline in bilateral upper and lower extremities. Musculoskeletal: No warm swelling or erythema around joints, no spinal tenderness noted. Psychiatric: Patient alert and oriented x3, good insight and cognition, good recent to remote recall.    Data Reviewed: Basic Metabolic Panel:  Recent Labs Lab 09/07/15 0835 09/08/15 0409  NA 138 139  K 3.5 3.7  CL 108 111  CO2 20* 20*  GLUCOSE 109* 98  BUN <5* <5*  CREATININE 0.46 0.50  CALCIUM 8.8* 8.6*   Liver Function Tests:  Recent Labs Lab 09/07/15 0835  AST 27  ALT 31  ALKPHOS 83  BILITOT 2.8*  PROT 7.4  ALBUMIN 3.7    Recent Labs Lab 09/07/15 0835  LIPASE 25  No results for input(s): AMMONIA in the last 168 hours. CBC:  Recent Labs Lab 09/07/15 0835 09/08/15 0409 09/10/15 0357  WBC 18.3* 18.3* 16.4*  NEUTROABS 16.1* 14.8* 13.4*  HGB 9.8* 8.2* 8.2*  HCT 28.4* 23.4* 23.7*  MCV 90.2 88.3 89.4  PLT 507* 386 347   Cardiac Enzymes: No results for input(s): CKTOTAL, CKMB, CKMBINDEX, TROPONINI in the last 168 hours. BNP (last 3 results) No results for input(s): BNP in the last 8760 hours.  ProBNP (last 3 results) No results for input(s): PROBNP in  the last 8760 hours.  CBG: No results for input(s): GLUCAP in the last 168 hours.  Recent Results (from the past 240 hour(s))  Urine culture     Status: Abnormal   Collection Time: 09/07/15  4:16 PM  Result Value Ref Range Status   Specimen Description URINE, CLEAN CATCH  Final   Special Requests NONE  Final   Culture MULTIPLE SPECIES PRESENT, SUGGEST RECOLLECTION (A)  Final   Report Status 09/09/2015 FINAL  Final     Studies: Dg Chest 2 View  09/07/2015  CLINICAL DATA:  Abdominal pain starting 5:30 this morning, history of sickle cell disease, [redacted] weeks pregnant EXAM: CHEST  2 VIEW COMPARISON:  08/11/2015 FINDINGS: Cardiomediastinal silhouette is stable. No acute infiltrate or pleural effusion. No pulmonary edema. Stable pleural parenchymal scarring in right upper lobe. Stable small calcified granulomas. Stable chronic sclerotic changes bilateral humeral heads consistent with sickle cell sequela. IMPRESSION: No acute infiltrate or pleural effusion. No pulmonary edema. Stable pleural parenchymal scarring in right upper lobe. Stable small calcified granulomas. Electronically Signed   By: Lahoma Crocker M.D.   On: 09/07/2015 09:24   Ir Fluoro Guide Cv Line Left  08/13/2015  INDICATION: 25 year old pregnant female with sickle cell disease currently and sickle cell crisis. She requires venous access for hydration and pain control. Prior attempts at right-sided PICC placement or unsuccessful. Left-sided placement will be attempted. EXAM: PICC LINE PLACEMENT WITH ULTRASOUND AND FLUOROSCOPIC GUIDANCE MEDICATIONS: None required ANESTHESIA/SEDATION: None FLUOROSCOPY TIME:  Fluoroscopy Time: 1 minutes 36 seconds (19 mGy). COMPLICATIONS: None immediate. PROCEDURE: The patient was advised of the possible risks and complications and agreed to undergo the procedure. The patient was then brought to the angiographic suite for the procedure. The left arm was prepped with chlorhexidine, draped in the usual sterile  fashion using maximum barrier technique (cap and mask, sterile gown, sterile gloves, large sterile sheet, hand hygiene and cutaneous antisepsis) and infiltrated locally with 1% Lidocaine. Ultrasound demonstrated patency of the left brachial vein, and this was documented with an image. Under real-time ultrasound guidance, this vein was accessed with a 21 gauge micropuncture needle and image documentation was performed. A 0.018 wire was introduced in to the vein. Over this, a 6 [place] dual lumen power injectable PICC was advanced to the lower SVC/right atrial junction. Fluoroscopy during the procedure and fluoro spot radiograph confirms appropriate catheter position. The catheter was flushed and covered with a sterile dressing. Catheter length: 40 cm IMPRESSION: Successful left arm power PICC line placement with ultrasound and fluoroscopic guidance. The catheter is ready for use. Electronically Signed   By: Jacqulynn Cadet M.D.   On: 08/13/2015 17:11   Ir US Guide Vasc Access Left  08/13/2015  INDICATION: 25 year old pregnant female with sickle cell disease currently and sickle cell crisis. She requires venous access for hydration and pain control. Prior attempts at right-sided PICC placement or unsuccessful. Left-sided placement will be attempted. EXAM: PICC LINE PLACEMENT WITH  ULTRASOUND AND FLUOROSCOPIC GUIDANCE MEDICATIONS: None required ANESTHESIA/SEDATION: None FLUOROSCOPY TIME:  Fluoroscopy Time: 1 minutes 36 seconds (19 mGy). COMPLICATIONS: None immediate. PROCEDURE: The patient was advised of the possible risks and complications and agreed to undergo the procedure. The patient was then brought to the angiographic suite for the procedure. The left arm was prepped with chlorhexidine, draped in the usual sterile fashion using maximum barrier technique (cap and mask, sterile gown, sterile gloves, large sterile sheet, hand hygiene and cutaneous antisepsis) and infiltrated locally with 1% Lidocaine. Ultrasound  demonstrated patency of the left brachial vein, and this was documented with an image. Under real-time ultrasound guidance, this vein was accessed with a 21 gauge micropuncture needle and image documentation was performed. A 0.018 wire was introduced in to the vein. Over this, a 6 [place] dual lumen power injectable PICC was advanced to the lower SVC/right atrial junction. Fluoroscopy during the procedure and fluoro spot radiograph confirms appropriate catheter position. The catheter was flushed and covered with a sterile dressing. Catheter length: 40 cm IMPRESSION: Successful left arm power PICC line placement with ultrasound and fluoroscopic guidance. The catheter is ready for use. Electronically Signed   By: Jacqulynn Cadet M.D.   On: 08/13/2015 17:11   [place] [facility] Ob Follow Up  08/18/2015  OBSTETRICAL ULTRASOUND: This exam was performed within a [facility]. The OB US report was generated in the [facility] system, and faxed to the ordering physician.  This report is available in the [facility]. See the [facility] Obstetric US report via the Image Link.   Scheduled Meds: . sodium chloride   Intravenous Once  . enoxaparin (LOVENOX) injection  40 mg Subcutaneous Q24H  . folic acid  1 mg Oral Daily  . HYDROmorphone   Intravenous Q4H  . methadone  5 mg Oral QHS  . prenatal multivitamin  1 tablet Oral Daily  . senna-docusate  1 tablet Oral BID   Continuous Infusions: . sodium chloride 10 mL/hr at 09/09/15 0826    Time spent 25 minutes.

## 2015-09-10 NOTE — Progress Notes (Signed)
NST reactive and reassuring, pt feeling baby move well. Pt in good spirits today. Called dr Ihor Dow, she was in a surgery, will show her tracing when I get back to hospital.

## 2015-09-10 NOTE — Procedures (Signed)
Successful placement of dual lumen Midline PICC line to left brachial vein. Length 24 cm Tip at mid (L) subclavian No complications Ready for use.  No power injections  Vincent Gros, Eleasha Cataldo PA-C

## 2015-09-10 NOTE — Care Management Important Message (Signed)
Important Message  Patient Details  Name: TAIYA BOGUS MRN: [id number] Date of Birth: 1990/12/11   Medicare Important Message Given:  Yes    Camillo Flaming 09/10/2015, 10:05 AMImportant Message  Patient Details  Name: FLORA HILLSTEAD MRN: [id number] Date of Birth: 08-11-1990   Medicare Important Message Given:  Yes    Camillo Flaming 09/10/2015, 10:04 AM

## 2015-09-11 LAB — TYPE AND SCREEN
ABO/RH(D): B POS
Antibody Screen: NEGATIVE
DAT, IgG: NEGATIVE
Donor AG Type: NEGATIVE
Unit division: 0

## 2015-09-11 IMAGING — US IR US GUIDE VASC ACCESS RIGHT
1 series · 1 of 1 positions shown · non-contrast
Comparison: none

CLINICAL DATA: 23-year-old with sickle cell anemia, third trimester
pregnancy and concern for a left arm PICC line infection. Request
for new PICC line placement.

EXAM:
FLUOROSCOPIC AND ULTRASOUND GUIDED PLACEMENT OF RIGHT ARM PICC LINE
FLUOROSCOPY TIME:  6 seconds, 2.3 mGy
TECHNIQUE: The procedure was explained to the patient. The risks and benefits
of the procedure were discussed and the patient's questions were
addressed. Specifically, the risks of radiation were pregnancy were
discussed with the patient. Informed consent was obtained from the
patient. The patient's abdomen and pelvis was shielded with lead.

[Series 1: ir fluoro/shunt/fist · 1 of 1 slices shown]
[im 1/1]
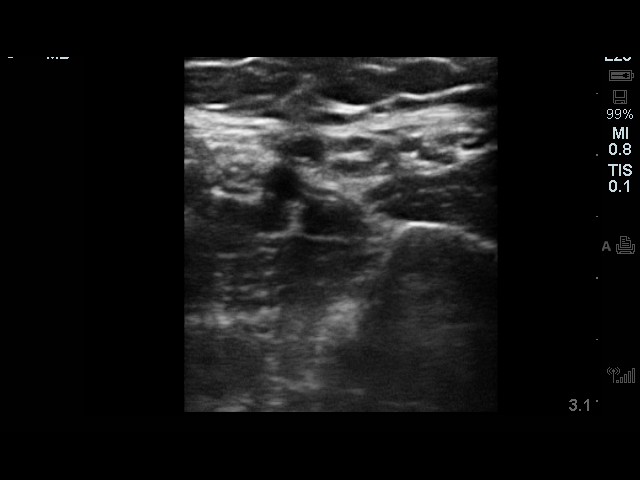

[1 of 1 positions shown; findings below may reference images not displayed]

The right arm was prepped with chlorhexidine, draped in the usual
sterile fashion using maximum barrier technique (cap and mask,
sterile gown, sterile gloves, large sterile sheet, hand hygiene and
cutaneous antiseptic). Local anesthesia was attained by infiltration
with 1% lidocaine.

Ultrasound demonstrated multiple right upper extremity veins.
Prominent vein which probably represents a brachial vein was
targeted. Under real-time ultrasound guidance, this vein was
accessed with a 21 gauge micropuncture needle and image
documentation was performed. The needle was exchanged over a
guidewire for a peel-away sheath through which a 36 cm 5 [place] dual
lumen power injectable PICC was advanced, and positioned with its
tip at the lower SVC/right atrial junction. Fluoroscopy during the
procedure and fluoro spot radiograph confirms appropriate catheter
position. The catheter was flushed, secured to the skin, and covered
with a sterile dressing. Left arm PICC line was removed at the end
of the procedure.
FINDINGS: Catheter tip near the superior cavoatrial junction.

COMPLICATIONS:
None.  The patient tolerated the procedure well.
IMPRESSION: Successful placement of a right arm PICC with sonographic and
fluoroscopic guidance. The catheter is ready for use.

## 2015-09-11 MED ORDER — HYDROMORPHONE HCL 2 MG/ML IJ SOLN
2.0000 mg | INTRAMUSCULAR | Status: DC | PRN
  Administered 2015-09-11 – 2015-09-13 (×15): 2 mg via INTRAVENOUS
  Filled 2015-09-11 (×16): qty 1

## 2015-09-11 MED ORDER — SODIUM CHLORIDE 0.9% FLUSH
10.0000 mL | Freq: Two times a day (BID) | INTRAVENOUS | Status: DC
  Administered 2015-09-11 – 2015-09-13 (×5): 10 mL

## 2015-09-11 MED ORDER — SODIUM CHLORIDE 0.9% FLUSH
10.0000 mL | INTRAVENOUS | Status: DC | PRN
Start: 2015-09-11 — End: 2015-09-14

## 2015-09-11 NOTE — Progress Notes (Signed)
SICKLE CELL SERVICE PROGRESS NOTE  [name] [id number] DOB: 04-Jan-1991 DOA: 09/07/2015 PCP: Angelica Chessman, MD  Assessment/Plan: Active Problems:   Nausea and vomiting   Dehydration    1. Hb SS with crisis: Pt reports that she is feeling better today and rates her pain at 7/10 . She has used 30 mg with 49/49: demands/deliveries in the last 24 hours.  Will continue current dosing of medication but will contiue frequency of Clinician assisted doses at q 3 hours. Pt currently completing partial exchange transfusion. Pt unable to receive Toradol due to pregnancy. 2. Anemia of Chronic Disease associated with Hb SS: Hb levels adequate for Hb SS with pregnancy. 3. Leukocytosis: No evidence of infection. Will continue to monitor. 4. Nausea, Vomiting and Diarrhea:Resolved. 5. Dehydration: resolved with IV hydration. 6. Chronic pain: Continue Methadone 7. 3rd Trimester Pregnancy: Appreciate RROB team attention. Will continue to monitor pregnancy and fetus. Last electrophoresis shows % of Hb S >30 % so will consider partial exchange transfusion to support fetal growth and decrease complications of pregnancy. 8. Constipation: Will ask nurse to give miraLAX today.  Code Status: Full Code Family Communication: N/A Disposition Plan: Not yet ready for discharge  [place].  Pager [phone]. If 7PM-7AM, please contact night-coverage.  09/11/2015, 7:17 AM  LOS: 4 days   Interim History: Rates pain 7/10 and localized top back . Last BM  Prior to admission.   Consultants:  RROB team  Procedures:  None  Antibiotics:  None   Objective: Filed Vitals:   09/11/15 0050 09/11/15 0438 09/11/15 0551 09/11/15 0631  BP:   112/67   Pulse:   95 97  Temp:   97.6 F (36.4 C) 98 F (36.7 C)  TempSrc:   Oral Axillary  Resp: 12 17 13 12   Height:      Weight:      SpO2: 96% 96% 98% 96%   Weight change:   Intake/Output Summary (Last 24 hours) at 09/11/15 0717 Last data  filed at 09/11/15 0556  Gross per 24 hour  Intake    580 ml  Output      0 ml  Net    580 ml    General: Alert, awake, oriented x3, in no acute distress.  HEENT: [place]/AT PEERL, EOMI, anicteric Neck: Trachea midline,  no masses, no thyromegal,y no JVD, no carotid bruit OROPHARYNX:  Moist, No exudate/ erythema/lesions.  Heart: Regular rate and rhythm, without murmurs, rubs, gallops, PMI non-displaced, no heaves or thrills on palpation.  Lungs: Clear to auscultation, no wheezing or rhonchi noted. No increased vocal fremitus resonant to percussion  Abdomen: Soft, nontender, nondistended, positive bowel sounds, no masses no hepatosplenomegaly noted.  Neuro: No focal neurological deficits noted cranial nerves II through XII grossly intact.  Strength at functional baseline in bilateral upper and lower extremities. Musculoskeletal: No warm swelling or erythema around joints, no spinal tenderness noted. Psychiatric: Patient alert and oriented x3, good insight and cognition, good recent to remote recall.    Data Reviewed: Basic Metabolic Panel:  Recent Labs Lab 09/07/15 0835 09/08/15 0409  NA 138 139  K 3.5 3.7  CL 108 111  CO2 20* 20*  GLUCOSE 109* 98  BUN <5* <5*  CREATININE 0.46 0.50  CALCIUM 8.8* 8.6*   Liver Function Tests:  Recent Labs Lab 09/07/15 0835  AST 27  ALT 31  ALKPHOS 83  BILITOT 2.8*  PROT 7.4  ALBUMIN 3.7    Recent Labs Lab 09/07/15 0835  LIPASE 25  No results for input(s): AMMONIA in the last 168 hours. CBC:  Recent Labs Lab 09/07/15 0835 09/08/15 0409 09/10/15 0357  WBC 18.3* 18.3* 16.4*  NEUTROABS 16.1* 14.8* 13.4*  HGB 9.8* 8.2* 8.2*  HCT 28.4* 23.4* 23.7*  MCV 90.2 88.3 89.4  PLT 507* 386 347   Cardiac Enzymes: No results for input(s): CKTOTAL, CKMB, CKMBINDEX, TROPONINI in the last 168 hours. BNP (last 3 results) No results for input(s): BNP in the last 8760 hours.  ProBNP (last 3 results) No results for input(s): PROBNP in the  last 8760 hours.  CBG: No results for input(s): GLUCAP in the last 168 hours.  Recent Results (from the past 240 hour(s))  Urine culture     Status: Abnormal   Collection Time: 09/07/15  4:16 PM  Result Value Ref Range Status   Specimen Description URINE, CLEAN CATCH  Final   Special Requests NONE  Final   Culture MULTIPLE SPECIES PRESENT, SUGGEST RECOLLECTION (A)  Final   Report Status 09/09/2015 FINAL  Final     Studies: Dg Chest 2 View  09/07/2015  CLINICAL DATA:  Abdominal pain starting 5:30 this morning, history of sickle cell disease, [redacted] weeks pregnant EXAM: CHEST  2 VIEW COMPARISON:  08/11/2015 FINDINGS: Cardiomediastinal silhouette is stable. No acute infiltrate or pleural effusion. No pulmonary edema. Stable pleural parenchymal scarring in right upper lobe. Stable small calcified granulomas. Stable chronic sclerotic changes bilateral humeral heads consistent with sickle cell sequela. IMPRESSION: No acute infiltrate or pleural effusion. No pulmonary edema. Stable pleural parenchymal scarring in right upper lobe. Stable small calcified granulomas. Electronically Signed   By: Lahoma Crocker M.D.   On: 09/07/2015 09:24   Ir Fluoro Guide Cv Line Left  09/10/2015  INDICATION: Poor venous access. Sickle cell anemia. Request PICC line placement. EXAM: LEFT UPPER EXTREMITY MIDLINE PICC LINE PLACEMENT WITH ULTRASOUND AND FLUOROSCOPIC GUIDANCE MEDICATIONS: None; The antibiotic was administered within an appropriate time interval prior to skin puncture. ANESTHESIA/SEDATION: Moderate Sedation Time:  None The patient was continuously monitored during the procedure by the interventional radiology nurse under my direct supervision. FLUOROSCOPY TIME:  Fluoroscopy Time: 9 minutes 36 seconds COMPLICATIONS: None immediate. PROCEDURE: The patient was advised of the possible risks and complications and agreed to undergo the procedure. The patient was then brought to the angiographic suite for the procedure. The  left arm was prepped with chlorhexidine, draped in the usual sterile fashion using maximum barrier technique (cap and mask, sterile gown, sterile gloves, large sterile sheet, hand hygiene and cutaneous antiseptic). Local anesthesia was attained by infiltration with 1% lidocaine. Ultrasound demonstrated patency of the brachial vein, and this was documented with an image. Under real-time ultrasound guidance, this vein was accessed with a 21 gauge micropuncture needle and image documentation was performed. The needle was exchanged over a guidewire for a peel-away sheath, however the catheter could not be advanced into the SVC despite multiple attempts. Ultimately, the decision was made to leave the catheter as a midline catheter and a 24 cm 5 French dual lumen PICC was advanced, and positioned with its tip at the mid left subclavian. Fluoroscopy during the procedure and fluoro spot radiograph confirms appropriate catheter position. The catheter was flushed, secured to the skin, and covered with a sterile dressing. IMPRESSION: Successful placement of a left arm midline PICC with sonographic and fluoroscopic guidance. The catheter is ready for use. Read by: Ascencion Dike PA-C Electronically Signed   By: Sandi Mariscal M.D.   On: 09/10/2015 16:53  Ir Fluoro Guide Cv Line Left  08/13/2015  INDICATION: 25 year old pregnant female with sickle cell disease currently and sickle cell crisis. She requires venous access for hydration and pain control. Prior attempts at right-sided PICC placement or unsuccessful. Left-sided placement will be attempted. EXAM: PICC LINE PLACEMENT WITH ULTRASOUND AND FLUOROSCOPIC GUIDANCE MEDICATIONS: None required ANESTHESIA/SEDATION: None FLUOROSCOPY TIME:  Fluoroscopy Time: 1 minutes 36 seconds (19 mGy). COMPLICATIONS: None immediate. PROCEDURE: The patient was advised of the possible risks and complications and agreed to undergo the procedure. The patient was then brought to the angiographic  suite for the procedure. The left arm was prepped with chlorhexidine, draped in the usual sterile fashion using maximum barrier technique (cap and mask, sterile gown, sterile gloves, large sterile sheet, hand hygiene and cutaneous antisepsis) and infiltrated locally with 1% Lidocaine. Ultrasound demonstrated patency of the left brachial vein, and this was documented with an image. Under real-time ultrasound guidance, this vein was accessed with a 21 gauge micropuncture needle and image documentation was performed. A 0.018 wire was introduced in to the vein. Over this, a 6 [place] dual lumen power injectable PICC was advanced to the lower SVC/right atrial junction. Fluoroscopy during the procedure and fluoro spot radiograph confirms appropriate catheter position. The catheter was flushed and covered with a sterile dressing. Catheter length: 40 cm IMPRESSION: Successful left arm power PICC line placement with ultrasound and fluoroscopic guidance. The catheter is ready for use. Electronically Signed   By: Jacqulynn Cadet M.D.   On: 08/13/2015 17:11   Ir US Guide Vasc Access Left  09/10/2015  INDICATION: Poor venous access. Sickle cell anemia. Request PICC line placement. EXAM: LEFT UPPER EXTREMITY MIDLINE PICC LINE PLACEMENT WITH ULTRASOUND AND FLUOROSCOPIC GUIDANCE MEDICATIONS: None; The antibiotic was administered within an appropriate time interval prior to skin puncture. ANESTHESIA/SEDATION: Moderate Sedation Time:  None The patient was continuously monitored during the procedure by the interventional radiology nurse under my direct supervision. FLUOROSCOPY TIME:  Fluoroscopy Time: 9 minutes 36 seconds COMPLICATIONS: None immediate. PROCEDURE: The patient was advised of the possible risks and complications and agreed to undergo the procedure. The patient was then brought to the angiographic suite for the procedure. The left arm was prepped with chlorhexidine, draped in the usual sterile fashion using maximum  barrier technique (cap and mask, sterile gown, sterile gloves, large sterile sheet, hand hygiene and cutaneous antiseptic). Local anesthesia was attained by infiltration with 1% lidocaine. Ultrasound demonstrated patency of the brachial vein, and this was documented with an image. Under real-time ultrasound guidance, this vein was accessed with a 21 gauge micropuncture needle and image documentation was performed. The needle was exchanged over a guidewire for a peel-away sheath, however the catheter could not be advanced into the SVC despite multiple attempts. Ultimately, the decision was made to leave the catheter as a midline catheter and a 24 cm 5 French dual lumen PICC was advanced, and positioned with its tip at the mid left subclavian. Fluoroscopy during the procedure and fluoro spot radiograph confirms appropriate catheter position. The catheter was flushed, secured to the skin, and covered with a sterile dressing. IMPRESSION: Successful placement of a left arm midline PICC with sonographic and fluoroscopic guidance. The catheter is ready for use. Read by: Ascencion Dike PA-C Electronically Signed   By: Sandi Mariscal M.D.   On: 09/10/2015 16:53   Ir US Guide Vasc Access Left  08/13/2015  INDICATION: 25 year old pregnant female with sickle cell disease currently and sickle cell crisis. She requires venous access  for hydration and pain control. Prior attempts at right-sided PICC placement or unsuccessful. Left-sided placement will be attempted. EXAM: PICC LINE PLACEMENT WITH ULTRASOUND AND FLUOROSCOPIC GUIDANCE MEDICATIONS: None required ANESTHESIA/SEDATION: None FLUOROSCOPY TIME:  Fluoroscopy Time: 1 minutes 36 seconds (19 mGy). COMPLICATIONS: None immediate. PROCEDURE: The patient was advised of the possible risks and complications and agreed to undergo the procedure. The patient was then brought to the angiographic suite for the procedure. The left arm was prepped with chlorhexidine, draped in the usual  sterile fashion using maximum barrier technique (cap and mask, sterile gown, sterile gloves, large sterile sheet, hand hygiene and cutaneous antisepsis) and infiltrated locally with 1% Lidocaine. Ultrasound demonstrated patency of the left brachial vein, and this was documented with an image. Under real-time ultrasound guidance, this vein was accessed with a 21 gauge micropuncture needle and image documentation was performed. A 0.018 wire was introduced in to the vein. Over this, a 6 [place] dual lumen power injectable PICC was advanced to the lower SVC/right atrial junction. Fluoroscopy during the procedure and fluoro spot radiograph confirms appropriate catheter position. The catheter was flushed and covered with a sterile dressing. Catheter length: 40 cm IMPRESSION: Successful left arm power PICC line placement with ultrasound and fluoroscopic guidance. The catheter is ready for use. Electronically Signed   By: Jacqulynn Cadet M.D.   On: 08/13/2015 17:11   [place] [facility] Ob Follow Up  08/18/2015  OBSTETRICAL ULTRASOUND: This exam was performed within a  Ultrasound Department. The OB US report was generated in the [facility] system, and faxed to the ordering physician.  This report is available in the [facility]. See the [facility] Obstetric US report via the Image Link.   Scheduled Meds: . sodium chloride   Intravenous Once  . enoxaparin (LOVENOX) injection  40 mg Subcutaneous Q24H  . folic acid  1 mg Oral Daily  . HYDROmorphone   Intravenous Q4H  . methadone  5 mg Oral QHS  . prenatal multivitamin  1 tablet Oral Daily  . senna-docusate  1 tablet Oral BID   Continuous Infusions: . sodium chloride 10 mL/hr at 09/09/15 0826    Time spent 25 minutes.

## 2015-09-11 NOTE — Progress Notes (Signed)
Pt vital signs pre blood administration were stable. 15 min after starting blood all vital signs were stable except BP. This nurse contacted on call doctor.

## 2015-09-11 NOTE — Progress Notes (Signed)
Dr. Glo Herring given daily update on patient.  Notified that patient is without OB complaints.  She denies vaginal bleeding, leaking of fluid, or contractions.  She reports good fetal movement. She reports that there are no plans for discharge in the next few days.

## 2015-09-11 NOTE — Progress Notes (Signed)
Spoke with Dr. Zigmund Daniel in regards to pt blood pressure during blood transfusion and MD states to run blood at normal rate (123ml/hr)

## 2015-09-12 DIAGNOSIS — R112 Nausea with vomiting, unspecified: Secondary | ICD-10-CM

## 2015-09-12 DIAGNOSIS — E860 Dehydration: Secondary | ICD-10-CM

## 2015-09-12 DIAGNOSIS — D5700 Hb-SS disease with crisis, unspecified: Secondary | ICD-10-CM

## 2015-09-12 MED ORDER — DEXTROSE-NACL 5-0.45 % IV SOLN
INTRAVENOUS | Status: DC
  Administered 2015-09-12 – 2015-09-14 (×4): via INTRAVENOUS

## 2015-09-12 NOTE — Progress Notes (Signed)
Patient ID: [name], female   DOB: 25-Feb-1991, 25 y.o.   MRN: [id number] SICKLE CELL SERVICE PROGRESS NOTE  [name] [id number] DOB: Oct 15, 1990 DOA: 09/07/2015 PCP: Angelica Chessman, MD  Assessment/Plan: Active Problems:   Nausea and vomiting   Dehydration  1. Hb SS with crisis: Pt is having more pain in her lower back and limbs today, rated at an 8/10 . She has not called for her clinician assisted dose this am. Will continue current dosing of medication and will contiue frequency of clinician assisted doses at q 3 hours. Pt unable to receive Toradol due to pregnancy. 2. Anemia of Chronic Disease associated with Hb SS: Hb levels adequate for Hb SS with pregnancy. 3. Leukocytosis: No evidence of infection. Will continue to monitor. 4. Nausea, Vomiting and Diarrhea: Resolved. 5. Chronic pain: Continue Methadone 6. 3rd Trimester Pregnancy: Seen by the OB team, no issues. Will continue to monitor. Patient had partial exchange transfusion yesterday to support fetal growth and decrease complications of pregnancy. For repeat H and H in am.  Code Status: Full Code Family Communication: No family member at the bedside Disposition Plan: Not yet ready for discharge  [name]  If 7PM-7AM, please contact night-coverage.  09/12/2015, 12:14 PM  LOS: 5 days   Interim History: Patient is day 5 on admission, still complaining of significant pain mostly in her lower back and limbs, requesting pain meds for breakthrough pain. She reports good fetal movement, no vaginal bleeding, no leakage.  Consultants:  Obstetrician  Procedures:  Partial Exchange transfusion  Antibiotics:  None  Objective: Filed Vitals:   09/12/15 0401 09/12/15 0553 09/12/15 0743 09/12/15 1210  BP:  117/74    Pulse:  104    Temp:  99.4 F (37.4 C)    TempSrc:  Oral    Resp: 15 14 14 16   Height:      Weight:      SpO2: 100% 100% 98% 98%   Weight change:   Intake/Output Summary  (Last 24 hours) at 09/12/15 1214 Last data filed at 09/12/15 0600  Gross per 24 hour  Intake    500 ml  Output      0 ml  Net    500 ml    General: Alert, awake, oriented x3, in no acute distress.  HEENT: [place]/AT [place], EOMI Neck: Trachea midline,  no masses, no thyromegal,y no JVD, no carotid bruit OROPHARYNX:  Moist, No exudate/ erythema/lesions.  Heart: Regular rate and rhythm, without murmurs, rubs, gallops, PMI non-displaced, no heaves or thrills on palpation.  Lungs: Clear to auscultation, no wheezing or rhonchi noted. No increased vocal fremitus resonant to percussion  Abdomen: Gravid, Soft, nontender, positive bowel sounds, no masses no hepatosplenomegaly noted..  Neuro: No focal neurological deficits noted cranial nerves II through XII grossly intact. DTRs 2+ bilaterally upper and lower extremities. Strength 5 out of 5 in bilateral upper and lower extremities. Musculoskeletal: No warm swelling or erythema around joints, no spinal tenderness noted. Psychiatric: Patient alert and oriented x3, good insight and cognition, good recent to remote recall. Lymph node survey: No cervical axillary or inguinal lymphadenopathy noted.   Data Reviewed: Basic Metabolic Panel:  Recent Labs Lab 09/07/15 0835 09/08/15 0409  NA 138 139  K 3.5 3.7  CL 108 111  CO2 20* 20*  GLUCOSE 109* 98  BUN <5* <5*  CREATININE 0.46 0.50  CALCIUM 8.8* 8.6*   Liver Function Tests:  Recent Labs Lab 09/07/15 0835  AST 27  ALT  31  ALKPHOS 83  BILITOT 2.8*  PROT 7.4  ALBUMIN 3.7    Recent Labs Lab 09/07/15 0835  LIPASE 25   No results for input(s): AMMONIA in the last 168 hours. CBC:  Recent Labs Lab 09/07/15 0835 09/08/15 0409 09/10/15 0357  WBC 18.3* 18.3* 16.4*  NEUTROABS 16.1* 14.8* 13.4*  HGB 9.8* 8.2* 8.2*  HCT 28.4* 23.4* 23.7*  MCV 90.2 88.3 89.4  PLT 507* 386 347   Cardiac Enzymes: No results for input(s): CKTOTAL, CKMB, CKMBINDEX, TROPONINI in the last 168 hours. BNP  (last 3 results) No results for input(s): BNP in the last 8760 hours.  ProBNP (last 3 results) No results for input(s): PROBNP in the last 8760 hours.  CBG: No results for input(s): GLUCAP in the last 168 hours.  Recent Results (from the past 240 hour(s))  Urine culture     Status: Abnormal   Collection Time: 09/07/15  4:16 PM  Result Value Ref Range Status   Specimen Description URINE, CLEAN CATCH  Final   Special Requests NONE  Final   Culture MULTIPLE SPECIES PRESENT, SUGGEST RECOLLECTION (A)  Final   Report Status 09/09/2015 FINAL  Final     Studies: Dg Chest 2 View  09/07/2015  CLINICAL DATA:  Abdominal pain starting 5:30 this morning, history of sickle cell disease, [redacted] weeks pregnant EXAM: CHEST  2 VIEW COMPARISON:  08/11/2015 FINDINGS: Cardiomediastinal silhouette is stable. No acute infiltrate or pleural effusion. No pulmonary edema. Stable pleural parenchymal scarring in right upper lobe. Stable small calcified granulomas. Stable chronic sclerotic changes bilateral humeral heads consistent with sickle cell sequela. IMPRESSION: No acute infiltrate or pleural effusion. No pulmonary edema. Stable pleural parenchymal scarring in right upper lobe. Stable small calcified granulomas. Electronically Signed   By: Lahoma Crocker M.D.   On: 09/07/2015 09:24   Ir Fluoro Guide Cv Line Left  09/10/2015  INDICATION: Poor venous access. Sickle cell anemia. Request PICC line placement. EXAM: LEFT UPPER EXTREMITY MIDLINE PICC LINE PLACEMENT WITH ULTRASOUND AND FLUOROSCOPIC GUIDANCE MEDICATIONS: None; The antibiotic was administered within an appropriate time interval prior to skin puncture. ANESTHESIA/SEDATION: Moderate Sedation Time:  None The patient was continuously monitored during the procedure by the interventional radiology nurse under my direct supervision. FLUOROSCOPY TIME:  Fluoroscopy Time: 9 minutes 36 seconds COMPLICATIONS: None immediate. PROCEDURE: The patient was advised of the possible  risks and complications and agreed to undergo the procedure. The patient was then brought to the angiographic suite for the procedure. The left arm was prepped with chlorhexidine, draped in the usual sterile fashion using maximum barrier technique (cap and mask, sterile gown, sterile gloves, large sterile sheet, hand hygiene and cutaneous antiseptic). Local anesthesia was attained by infiltration with 1% lidocaine. Ultrasound demonstrated patency of the brachial vein, and this was documented with an image. Under real-time ultrasound guidance, this vein was accessed with a 21 gauge micropuncture needle and image documentation was performed. The needle was exchanged over a guidewire for a peel-away sheath, however the catheter could not be advanced into the SVC despite multiple attempts. Ultimately, the decision was made to leave the catheter as a midline catheter and a 24 cm 5 French dual lumen PICC was advanced, and positioned with its tip at the mid left subclavian. Fluoroscopy during the procedure and fluoro spot radiograph confirms appropriate catheter position. The catheter was flushed, secured to the skin, and covered with a sterile dressing. IMPRESSION: Successful placement of a left arm midline PICC with sonographic and fluoroscopic guidance.  The catheter is ready for use. Read by: Ascencion Dike PA-C Electronically Signed   By: Sandi Mariscal M.D.   On: 09/10/2015 16:53   Ir Fluoro Guide Cv Line Left  08/13/2015  INDICATION: 25 year old pregnant female with sickle cell disease currently and sickle cell crisis. She requires venous access for hydration and pain control. Prior attempts at right-sided PICC placement or unsuccessful. Left-sided placement will be attempted. EXAM: PICC LINE PLACEMENT WITH ULTRASOUND AND FLUOROSCOPIC GUIDANCE MEDICATIONS: None required ANESTHESIA/SEDATION: None FLUOROSCOPY TIME:  Fluoroscopy Time: 1 minutes 36 seconds (19 mGy). COMPLICATIONS: None immediate. PROCEDURE: The patient  was advised of the possible risks and complications and agreed to undergo the procedure. The patient was then brought to the angiographic suite for the procedure. The left arm was prepped with chlorhexidine, draped in the usual sterile fashion using maximum barrier technique (cap and mask, sterile gown, sterile gloves, large sterile sheet, hand hygiene and cutaneous antisepsis) and infiltrated locally with 1% Lidocaine. Ultrasound demonstrated patency of the left brachial vein, and this was documented with an image. Under real-time ultrasound guidance, this vein was accessed with a 21 gauge micropuncture needle and image documentation was performed. A 0.018 wire was introduced in to the vein. Over this, a 6 [place] dual lumen power injectable PICC was advanced to the lower SVC/right atrial junction. Fluoroscopy during the procedure and fluoro spot radiograph confirms appropriate catheter position. The catheter was flushed and covered with a sterile dressing. Catheter length: 40 cm IMPRESSION: Successful left arm power PICC line placement with ultrasound and fluoroscopic guidance. The catheter is ready for use. Electronically Signed   By: Jacqulynn Cadet M.D.   On: 08/13/2015 17:11   Ir US Guide Vasc Access Left  09/10/2015  INDICATION: Poor venous access. Sickle cell anemia. Request PICC line placement. EXAM: LEFT UPPER EXTREMITY MIDLINE PICC LINE PLACEMENT WITH ULTRASOUND AND FLUOROSCOPIC GUIDANCE MEDICATIONS: None; The antibiotic was administered within an appropriate time interval prior to skin puncture. ANESTHESIA/SEDATION: Moderate Sedation Time:  None The patient was continuously monitored during the procedure by the interventional radiology nurse under my direct supervision. FLUOROSCOPY TIME:  Fluoroscopy Time: 9 minutes 36 seconds COMPLICATIONS: None immediate. PROCEDURE: The patient was advised of the possible risks and complications and agreed to undergo the procedure. The patient was then brought to  the angiographic suite for the procedure. The left arm was prepped with chlorhexidine, draped in the usual sterile fashion using maximum barrier technique (cap and mask, sterile gown, sterile gloves, large sterile sheet, hand hygiene and cutaneous antiseptic). Local anesthesia was attained by infiltration with 1% lidocaine. Ultrasound demonstrated patency of the brachial vein, and this was documented with an image. Under real-time ultrasound guidance, this vein was accessed with a 21 gauge micropuncture needle and image documentation was performed. The needle was exchanged over a guidewire for a peel-away sheath, however the catheter could not be advanced into the SVC despite multiple attempts. Ultimately, the decision was made to leave the catheter as a midline catheter and a 24 cm 5 French dual lumen PICC was advanced, and positioned with its tip at the mid left subclavian. Fluoroscopy during the procedure and fluoro spot radiograph confirms appropriate catheter position. The catheter was flushed, secured to the skin, and covered with a sterile dressing. IMPRESSION: Successful placement of a left arm midline PICC with sonographic and fluoroscopic guidance. The catheter is ready for use. Read by: Ascencion Dike PA-C Electronically Signed   By: Sandi Mariscal M.D.   On: 09/10/2015 16:53  Ir US Guide Vasc Access Left  08/13/2015  INDICATION: 25 year old pregnant female with sickle cell disease currently and sickle cell crisis. She requires venous access for hydration and pain control. Prior attempts at right-sided PICC placement or unsuccessful. Left-sided placement will be attempted. EXAM: PICC LINE PLACEMENT WITH ULTRASOUND AND FLUOROSCOPIC GUIDANCE MEDICATIONS: None required ANESTHESIA/SEDATION: None FLUOROSCOPY TIME:  Fluoroscopy Time: 1 minutes 36 seconds (19 mGy). COMPLICATIONS: None immediate. PROCEDURE: The patient was advised of the possible risks and complications and agreed to undergo the procedure. The  patient was then brought to the angiographic suite for the procedure. The left arm was prepped with chlorhexidine, draped in the usual sterile fashion using maximum barrier technique (cap and mask, sterile gown, sterile gloves, large sterile sheet, hand hygiene and cutaneous antisepsis) and infiltrated locally with 1% Lidocaine. Ultrasound demonstrated patency of the left brachial vein, and this was documented with an image. Under real-time ultrasound guidance, this vein was accessed with a 21 gauge micropuncture needle and image documentation was performed. A 0.018 wire was introduced in to the vein. Over this, a 6 [place] dual lumen power injectable PICC was advanced to the lower SVC/right atrial junction. Fluoroscopy during the procedure and fluoro spot radiograph confirms appropriate catheter position. The catheter was flushed and covered with a sterile dressing. Catheter length: 40 cm IMPRESSION: Successful left arm power PICC line placement with ultrasound and fluoroscopic guidance. The catheter is ready for use. Electronically Signed   By: Jacqulynn Cadet M.D.   On: 08/13/2015 17:11   [place] Mfm Ob Follow Up  08/18/2015  OBSTETRICAL ULTRASOUND: This exam was performed within a [facility]. The OB US report was generated in the AS system, and faxed to the ordering physician.  This report is available in the [facility]. See the [facility] Obstetric US report via the Image Link.   Scheduled Meds: . sodium chloride   Intravenous Once  . enoxaparin (LOVENOX) injection  40 mg Subcutaneous Q24H  . folic acid  1 mg Oral Daily  . HYDROmorphone   Intravenous Q4H  . methadone  5 mg Oral QHS  . prenatal multivitamin  1 tablet Oral Daily  . senna-docusate  1 tablet Oral BID  . sodium chloride flush  10-40 mL Intracatheter Q12H   Continuous Infusions: . sodium chloride 10 mL/hr at 09/09/15 [phone]

## 2015-09-12 NOTE — Progress Notes (Signed)
Spoke with Dr. Elly Modena. Pt has no OB complaints. Denies vaginal bleeding, leaking of fluid, or contractions. Says she feels her baby move. FHR tracing is a category 1 with no uc's.

## 2015-09-13 ENCOUNTER — Other Ambulatory Visit: Payer: Self-pay | Admitting: Internal Medicine

## 2015-09-13 DIAGNOSIS — D571 Sickle-cell disease without crisis: Secondary | ICD-10-CM

## 2015-09-13 LAB — BASIC METABOLIC PANEL
ANION GAP: 8 (ref 5–15)
BUN: <5 mg/dL — ABNORMAL LOW (ref 6–20)
CALCIUM: 8.4 mg/dL — AB (ref 8.9–10.3)
CHLORIDE: 104 mmol/L (ref 101–111)
CO2: 23 mmol/L (ref 22–32)
CREATININE: 0.44 mg/dL (ref 0.44–1.00)
GFR calc Af Amer: >60 mL/min (ref >60)
GFR calc non Af Amer: >60 mL/min (ref >60)
GLUCOSE: 93 mg/dL (ref 65–99)
Potassium: 3.7 mmol/L (ref 3.5–5.1)
Sodium: 135 mmol/L (ref 135–145)

## 2015-09-13 LAB — CBC WITH DIFFERENTIAL/PLATELET
Basophils Absolute: 0 10*3/uL (ref 0.0–0.1)
Basophils Relative: 0 %
EOS ABS: 0.3 10*3/uL (ref 0.0–0.7)
EOS PCT: 2 %
HEMATOCRIT: 22.6 % — AB (ref 36.0–46.0)
HEMOGLOBIN: 7.8 g/dL — AB (ref 12.0–15.0)
LYMPHS ABS: 2 10*3/uL (ref 0.7–4.0)
Lymphocytes Relative: 13 %
MCH: 30.5 pg (ref 26.0–34.0)
MCHC: 34.5 g/dL (ref 30.0–36.0)
MCV: 88.3 fL (ref 78.0–100.0)
MONOS PCT: 8 %
Monocytes Absolute: 1.3 10*3/uL — ABNORMAL HIGH (ref 0.1–1.0)
Neutro Abs: 12.3 10*3/uL — ABNORMAL HIGH (ref 1.7–7.7)
Neutrophils Relative %: 77 %
Platelets: 280 10*3/uL (ref 150–400)
RBC: 2.56 MIL/uL — ABNORMAL LOW (ref 3.87–5.11)
RDW: 20.7 % — AB (ref 11.5–15.5)
WBC: 16 10*3/uL — ABNORMAL HIGH (ref 4.0–10.5)

## 2015-09-13 MED ORDER — HYDROMORPHONE HCL 2 MG/ML IJ SOLN
2.0000 mg | INTRAMUSCULAR | Status: DC | PRN
  Administered 2015-09-13 – 2015-09-14 (×3): 2 mg via INTRAVENOUS
  Filled 2015-09-13 (×3): qty 1

## 2015-09-13 MED ORDER — OXYCODONE HCL 15 MG PO TABS: 15.0000 mg | ORAL_TABLET | ORAL | Status: DC | PRN

## 2015-09-13 NOTE — Progress Notes (Signed)
Spoke with Dr. [facility] Pies. Pt denies vaginal bleeding, leaking of fluid, or uc's. Says she feels the baby move. FHR tracings will be reviewed by Dr. [facility] Pies . Monitored pt for 1 hr today. FHR tracing category 1 with pos accels the last 15 min of tracing.

## 2015-09-13 NOTE — Progress Notes (Signed)
Dr. [facility] Pies on unit. Reveiwed FHR tracings

## 2015-09-13 NOTE — Progress Notes (Signed)
Patient ID: [name], female   DOB: 08-Aug-1990, 25 y.o.   MRN: [id number] SICKLE CELL SERVICE PROGRESS NOTE  [name] [id number] DOB: 02-Jan-1991 DOA: 09/07/2015 PCP: Angelica Chessman, MD  Assessment/Plan: Active Problems:   Nausea and vomiting   Dehydration  1. Hb SS with crisis: Pt is much better today, pain is reduced to about 6/10, pain still in her lower back and limbs but improved. Will continue current dosing of medication and will attempt to reduce frequency of clinician assisted doses. Pt unable to receive Toradol due to pregnancy. 2. Anemia of Chronic Disease associated with Hb SS: Hb levels is stable and adequate for Hb SS with pregnancy. 3. Leukocytosis: No evidence of infection. Will continue to monitor. 4. Nausea, Vomiting and Diarrhea: Resolved. 5. Chronic pain: Continue Methadone 6. 3rd Trimester Pregnancy: Seen by the OB team, no issues. Will continue to monitor. Patient had partial exchange transfusion 09/11/2015 to support fetal growth and decrease complications of pregnancy. Patient has OB appointment on Tuesday 09/15/2015 and PCP appointment on 09/22/2015.  Code Status: Full Code Family Communication: N/A Disposition Plan: Possible Discharge tomorrow  [name]  If 7PM-7AM, please contact night-coverage.  09/13/2015, 4:47 PM  LOS: 6 days   Consultants:  None  Procedures:  Partial Exchange transfusion  Antibiotics:  None  Objective: Filed Vitals:   09/13/15 0515 09/13/15 0533 09/13/15 0741 09/13/15 1200  BP: 102/81     Pulse: 100     Temp: 97.8 F (36.6 C)     TempSrc: Oral     Resp: 15 18 14 14   Height:      Weight:      SpO2: 98%  98% 96%   Weight change:   Intake/Output Summary (Last 24 hours) at 09/13/15 1647 Last data filed at 09/13/15 0551  Gross per 24 hour  Intake 1305.4 ml  Output      0 ml  Net 1305.4 ml    General: Alert, awake, oriented x3, in no acute distress.  HEENT: [place]/AT [place], EOMI Neck:  Trachea midline,  no masses, no thyromegal,y no JVD, no carotid bruit OROPHARYNX:  Moist, No exudate/ erythema/lesions.  Heart: Regular rate and rhythm, without murmurs, rubs, gallops, PMI non-displaced, no heaves or thrills on palpation.  Lungs: Clear to auscultation, no wheezing or rhonchi noted. No increased vocal fremitus resonant to percussion  Abdomen: Gravidly distended, Soft, nontender, positive bowel sounds, no masses no hepatosplenomegaly noted..  Neuro: No focal neurological deficits noted cranial nerves II through XII grossly intact. DTRs 2+ bilaterally upper and lower extremities. Strength 5 out of 5 in bilateral upper and lower extremities. Musculoskeletal: No warm swelling or erythema around joints, no spinal tenderness noted. Psychiatric: Patient alert and oriented x3, good insight and cognition, good recent to remote recall. Lymph node survey: No cervical axillary or inguinal lymphadenopathy noted.   Data Reviewed: Basic Metabolic Panel:  Recent Labs Lab 09/07/15 0835 09/08/15 0409 09/13/15 0543  NA 138 139 135  K 3.5 3.7 3.7  CL 108 111 104  CO2 20* 20* 23  GLUCOSE 109* 98 93  BUN <5* <5* <5*  CREATININE 0.46 0.50 0.44  CALCIUM 8.8* 8.6* 8.4*   Liver Function Tests:  Recent Labs Lab 09/07/15 0835  AST 27  ALT 31  ALKPHOS 83  BILITOT 2.8*  PROT 7.4  ALBUMIN 3.7    Recent Labs Lab 09/07/15 0835  LIPASE 25   No results for input(s): AMMONIA in the last 168 hours. CBC:  Recent Labs  Lab 09/07/15 0835 09/08/15 0409 09/10/15 0357 09/13/15 0543  WBC 18.3* 18.3* 16.4* 16.0*  NEUTROABS 16.1* 14.8* 13.4* 12.3*  HGB 9.8* 8.2* 8.2* 7.8*  HCT 28.4* 23.4* 23.7* 22.6*  MCV 90.2 88.3 89.4 88.3  PLT 507* 386 347 280   Cardiac Enzymes: No results for input(s): CKTOTAL, CKMB, CKMBINDEX, TROPONINI in the last 168 hours. BNP (last 3 results) No results for input(s): BNP in the last 8760 hours.  ProBNP (last 3 results) No results for input(s): PROBNP in  the last 8760 hours.  CBG: No results for input(s): GLUCAP in the last 168 hours.  Recent Results (from the past 240 hour(s))  Urine culture     Status: Abnormal   Collection Time: 09/07/15  4:16 PM  Result Value Ref Range Status   Specimen Description URINE, CLEAN CATCH  Final   Special Requests NONE  Final   Culture MULTIPLE SPECIES PRESENT, SUGGEST RECOLLECTION (A)  Final   Report Status 09/09/2015 FINAL  Final     Studies: Dg Chest 2 View  09/07/2015  CLINICAL DATA:  Abdominal pain starting 5:30 this morning, history of sickle cell disease, [redacted] weeks pregnant EXAM: CHEST  2 VIEW COMPARISON:  08/11/2015 FINDINGS: Cardiomediastinal silhouette is stable. No acute infiltrate or pleural effusion. No pulmonary edema. Stable pleural parenchymal scarring in right upper lobe. Stable small calcified granulomas. Stable chronic sclerotic changes bilateral humeral heads consistent with sickle cell sequela. IMPRESSION: No acute infiltrate or pleural effusion. No pulmonary edema. Stable pleural parenchymal scarring in right upper lobe. Stable small calcified granulomas. Electronically Signed   By: Lahoma Crocker M.D.   On: 09/07/2015 09:24   Ir Fluoro Guide Cv Line Left  09/10/2015  INDICATION: Poor venous access. Sickle cell anemia. Request PICC line placement. EXAM: LEFT UPPER EXTREMITY MIDLINE PICC LINE PLACEMENT WITH ULTRASOUND AND FLUOROSCOPIC GUIDANCE MEDICATIONS: None; The antibiotic was administered within an appropriate time interval prior to skin puncture. ANESTHESIA/SEDATION: Moderate Sedation Time:  None The patient was continuously monitored during the procedure by the interventional radiology nurse under my direct supervision. FLUOROSCOPY TIME:  Fluoroscopy Time: 9 minutes 36 seconds COMPLICATIONS: None immediate. PROCEDURE: The patient was advised of the possible risks and complications and agreed to undergo the procedure. The patient was then brought to the angiographic suite for the procedure.  The left arm was prepped with chlorhexidine, draped in the usual sterile fashion using maximum barrier technique (cap and mask, sterile gown, sterile gloves, large sterile sheet, hand hygiene and cutaneous antiseptic). Local anesthesia was attained by infiltration with 1% lidocaine. Ultrasound demonstrated patency of the brachial vein, and this was documented with an image. Under real-time ultrasound guidance, this vein was accessed with a 21 gauge micropuncture needle and image documentation was performed. The needle was exchanged over a guidewire for a peel-away sheath, however the catheter could not be advanced into the SVC despite multiple attempts. Ultimately, the decision was made to leave the catheter as a midline catheter and a 24 cm 5 French dual lumen PICC was advanced, and positioned with its tip at the mid left subclavian. Fluoroscopy during the procedure and fluoro spot radiograph confirms appropriate catheter position. The catheter was flushed, secured to the skin, and covered with a sterile dressing. IMPRESSION: Successful placement of a left arm midline PICC with sonographic and fluoroscopic guidance. The catheter is ready for use. Read by: Ascencion Dike PA-C Electronically Signed   By: Sandi Mariscal M.D.   On: 09/10/2015 16:53   Ir US Guide Vasc Access  Left  09/10/2015  INDICATION: Poor venous access. Sickle cell anemia. Request PICC line placement. EXAM: LEFT UPPER EXTREMITY MIDLINE PICC LINE PLACEMENT WITH ULTRASOUND AND FLUOROSCOPIC GUIDANCE MEDICATIONS: None; The antibiotic was administered within an appropriate time interval prior to skin puncture. ANESTHESIA/SEDATION: Moderate Sedation Time:  None The patient was continuously monitored during the procedure by the interventional radiology nurse under my direct supervision. FLUOROSCOPY TIME:  Fluoroscopy Time: 9 minutes 36 seconds COMPLICATIONS: None immediate. PROCEDURE: The patient was advised of the possible risks and complications and  agreed to undergo the procedure. The patient was then brought to the angiographic suite for the procedure. The left arm was prepped with chlorhexidine, draped in the usual sterile fashion using maximum barrier technique (cap and mask, sterile gown, sterile gloves, large sterile sheet, hand hygiene and cutaneous antiseptic). Local anesthesia was attained by infiltration with 1% lidocaine. Ultrasound demonstrated patency of the brachial vein, and this was documented with an image. Under real-time ultrasound guidance, this vein was accessed with a 21 gauge micropuncture needle and image documentation was performed. The needle was exchanged over a guidewire for a peel-away sheath, however the catheter could not be advanced into the SVC despite multiple attempts. Ultimately, the decision was made to leave the catheter as a midline catheter and a 24 cm 5 French dual lumen PICC was advanced, and positioned with its tip at the mid left subclavian. Fluoroscopy during the procedure and fluoro spot radiograph confirms appropriate catheter position. The catheter was flushed, secured to the skin, and covered with a sterile dressing. IMPRESSION: Successful placement of a left arm midline PICC with sonographic and fluoroscopic guidance. The catheter is ready for use. Read by: Ascencion Dike PA-C Electronically Signed   By: Sandi Mariscal M.D.   On: 09/10/2015 16:53   [place] [facility]fm Ob Follow Up  08/18/2015  OBSTETRICAL ULTRASOUND: This exam was performed within a [facility]. The OB US report was generated in the [facility] system, and faxed to the ordering physician.  This report is available in the [facility]. See the [facility] Obstetric US report via the Image Link.   Scheduled Meds: . enoxaparin (LOVENOX) injection  40 mg Subcutaneous Q24H  . folic acid  1 mg Oral Daily  . HYDROmorphone   Intravenous Q4H  . methadone  5 mg Oral QHS  . prenatal multivitamin  1 tablet Oral Daily  . senna-docusate  1 tablet Oral BID  .  sodium chloride flush  10-40 mL Intracatheter Q12H   Continuous Infusions: . sodium chloride 10 mL/hr at 09/09/15 0826  . dextrose 5 % and 0.45% NaCl 125 mL/hr at 09/12/15 2314    Active Problems:   Nausea and vomiting   Dehydration

## 2015-09-14 ENCOUNTER — Other Ambulatory Visit: Payer: Self-pay | Admitting: *Deleted

## 2015-09-14 LAB — TYPE AND SCREEN
ABO/RH(D): B POS
ANTIBODY SCREEN: POSITIVE
DAT, IgG: NEGATIVE
UNIT DIVISION: 0

## 2015-09-14 LAB — CBC WITH DIFFERENTIAL/PLATELET
BASOS ABS: 0 10*3/uL (ref 0.0–0.1)
BASOS PCT: 0 %
EOS PCT: 1 %
Eosinophils Absolute: 0.2 10*3/uL (ref 0.0–0.7)
HCT: 21.3 % — ABNORMAL LOW (ref 36.0–46.0)
HEMOGLOBIN: 7.4 g/dL — AB (ref 12.0–15.0)
LYMPHS ABS: 1.9 10*3/uL (ref 0.7–4.0)
LYMPHS PCT: 12 %
MCH: 30.5 pg (ref 26.0–34.0)
MCHC: 34.7 g/dL (ref 30.0–36.0)
MCV: 87.7 fL (ref 78.0–100.0)
MONOS PCT: 8 %
Monocytes Absolute: 1.3 10*3/uL — ABNORMAL HIGH (ref 0.1–1.0)
NEUTROS PCT: 79 %
Neutro Abs: 12.7 10*3/uL — ABNORMAL HIGH (ref 1.7–7.7)
PLATELETS: 313 10*3/uL (ref 150–400)
RBC: 2.43 MIL/uL — AB (ref 3.87–5.11)
RDW: 20.8 % — ABNORMAL HIGH (ref 11.5–15.5)
WBC: 16.1 10*3/uL — AB (ref 4.0–10.5)
nRBC: 2 /100 WBC — ABNORMAL HIGH

## 2015-09-14 LAB — RETICULOCYTES
RBC.: 2.41 MIL/uL — ABNORMAL LOW (ref 3.87–5.11)
RETIC CT PCT: 20.8 % — AB (ref 0.4–3.1)
Retic Count, Absolute: 501.3 10*3/uL — ABNORMAL HIGH (ref 19.0–186.0)

## 2015-09-14 LAB — CREATININE, SERUM
Creatinine, Ser: 0.5 mg/dL (ref 0.44–1.00)
GFR calc Af Amer: >60 mL/min (ref >60)
GFR calc non Af Amer: >60 mL/min (ref >60)

## 2015-09-14 MED ORDER — OXYCODONE HCL 5 MG PO TABS
15.0000 mg | ORAL_TABLET | ORAL | Status: DC
  Administered 2015-09-14: 15 mg via ORAL
  Filled 2015-09-14: qty 3

## 2015-09-14 MED FILL — oxyCODONE HCL 15 MG TABS: 15 | 15 days supply | Qty: 90 | Fill #0

## 2015-09-14 NOTE — Progress Notes (Signed)
Discharge instructions given to pt by [facility]. Pt did not want to wait to go over them, states she was going to leave without them because her grandmother was here to pick her up.

## 2015-09-14 NOTE — Discharge Summary (Signed)
[name] MRN: [id number] DOB/AGE: 18-Feb-1991 25 y.o.  Admit date: 09/07/2015 Discharge date: 09/14/2015  Primary Care Physician:  Angelica Chessman, MD   Discharge Diagnoses:   Patient Active Problem List   Diagnosis Date Noted  . Mood swings ([place]) 11/08/2011    Priority: High  . Depression 01/06/2011    Priority: High  . Sickle cell disease ([place]) 01/08/2009    Priority: High  . Nausea and vomiting 09/07/2015  . Dehydration 09/07/2015  . Third trimester pregnancy 09/01/2015  . High risk for intrapartum complications, antepartum 08/12/2015  . Sickle cell anemia ([place]) 08/10/2015  . Chronic pain 08/04/2015  . Cocaine use 07/24/2015  . 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 Sickle cell anemia with pain ([place]) 07/24/2015  . Maternal sickle cell anemia affecting pregnancy, antepartum ([place]) 07/14/2015  . Sickle cell pain crisis ([place]) 07/12/2015  . Second trimester pregnancy 07/07/2015  . 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 Anemia of chronic disease   . Leukocytosis   . Migraines 11/08/2011  . GERD (gastroesophageal reflux disease) 02/17/2011  . Trichotillomania 01/08/2009    DISCHARGE MEDICATION:   Medication List    TAKE these medications        acetaminophen 500 MG tablet  Commonly known as:  TYLENOL  Take 1,000 mg by mouth daily as needed for moderate pain or headache.     CVS PRENATAL GUMMY 0.4-113.5 MG Chew  Chew 2-3 each by mouth daily.     folic acid 1 MG tablet  Commonly known as:  FOLVITE  Take 1 mg by mouth daily.     methadone 5 MG tablet  Commonly known as:  DOLOPHINE  Take 1 tablet (5 mg total) by mouth at bedtime.     oxyCODONE 15 MG immediate release tablet  Commonly known as:  ROXICODONE  Take 1 tablet (15 mg total) by mouth every 4 (four) hours as needed for pain.          Consults: Treatment Team:  Truett Mainland, DO   SIGNIFICANT DIAGNOSTIC STUDIES:  Dg Chest 2 View  09/07/2015  CLINICAL DATA:  Abdominal pain starting  5:30 this morning, history of sickle cell disease, [redacted] weeks pregnant EXAM: CHEST  2 VIEW COMPARISON:  08/11/2015 FINDINGS: Cardiomediastinal silhouette is stable. No acute infiltrate or pleural effusion. No pulmonary edema. Stable pleural parenchymal scarring in right upper lobe. Stable small calcified granulomas. Stable chronic sclerotic changes bilateral humeral heads consistent with sickle cell sequela. IMPRESSION: No acute infiltrate or pleural effusion. No pulmonary edema. Stable pleural parenchymal scarring in right upper lobe. Stable small calcified granulomas. Electronically Signed   By: Lahoma Crocker M.D.   On: 09/07/2015 09:24   Ir Fluoro Guide Cv Line Left  09/10/2015  INDICATION: Poor venous access. Sickle cell anemia. Request PICC line placement. EXAM: LEFT UPPER EXTREMITY MIDLINE PICC LINE PLACEMENT WITH ULTRASOUND AND FLUOROSCOPIC GUIDANCE MEDICATIONS: None; The antibiotic was administered within an appropriate time interval prior to skin puncture. ANESTHESIA/SEDATION: Moderate Sedation Time:  None The patient was continuously monitored during the procedure by the interventional radiology nurse under my direct supervision. FLUOROSCOPY TIME:  Fluoroscopy Time: 9 minutes 36 seconds COMPLICATIONS: None immediate. PROCEDURE: The patient was advised of the possible risks and complications and agreed to undergo the procedure. The patient was then brought to the angiographic suite for the procedure. The left arm was prepped with chlorhexidine, draped in the usual sterile fashion using maximum barrier technique (cap and mask, sterile gown, sterile gloves, large sterile sheet,  hand hygiene and cutaneous antiseptic). Local anesthesia was attained by infiltration with 1% lidocaine. Ultrasound demonstrated patency of the brachial vein, and this was documented with an image. Under real-time ultrasound guidance, this vein was accessed with a 21 gauge micropuncture needle and image documentation was performed. The  needle was exchanged over a guidewire for a peel-away sheath, however the catheter could not be advanced into the SVC despite multiple attempts. Ultimately, the decision was made to leave the catheter as a midline catheter and a 24 cm 5 French dual lumen PICC was advanced, and positioned with its tip at the mid left subclavian. Fluoroscopy during the procedure and fluoro spot radiograph confirms appropriate catheter position. The catheter was flushed, secured to the skin, and covered with a sterile dressing. IMPRESSION: Successful placement of a left arm midline PICC with sonographic and fluoroscopic guidance. The catheter is ready for use. Read by: Ascencion Dike PA-C Electronically Signed   By: Sandi Mariscal M.D.   On: 09/10/2015 16:53   Ir US Guide Vasc Access Left  09/10/2015  INDICATION: Poor venous access. Sickle cell anemia. Request PICC line placement. EXAM: LEFT UPPER EXTREMITY MIDLINE PICC LINE PLACEMENT WITH ULTRASOUND AND FLUOROSCOPIC GUIDANCE MEDICATIONS: None; The antibiotic was administered within an appropriate time interval prior to skin puncture. ANESTHESIA/SEDATION: Moderate Sedation Time:  None The patient was continuously monitored during the procedure by the interventional radiology nurse under my direct supervision. FLUOROSCOPY TIME:  Fluoroscopy Time: 9 minutes 36 seconds COMPLICATIONS: None immediate. PROCEDURE: The patient was advised of the possible risks and complications and agreed to undergo the procedure. The patient was then brought to the angiographic suite for the procedure. The left arm was prepped with chlorhexidine, draped in the usual sterile fashion using maximum barrier technique (cap and mask, sterile gown, sterile gloves, large sterile sheet, hand hygiene and cutaneous antiseptic). Local anesthesia was attained by infiltration with 1% lidocaine. Ultrasound demonstrated patency of the brachial vein, and this was documented with an image. Under real-time ultrasound guidance,  this vein was accessed with a 21 gauge micropuncture needle and image documentation was performed. The needle was exchanged over a guidewire for a peel-away sheath, however the catheter could not be advanced into the SVC despite multiple attempts. Ultimately, the decision was made to leave the catheter as a midline catheter and a 24 cm 5 French dual lumen PICC was advanced, and positioned with its tip at the mid left subclavian. Fluoroscopy during the procedure and fluoro spot radiograph confirms appropriate catheter position. The catheter was flushed, secured to the skin, and covered with a sterile dressing. IMPRESSION: Successful placement of a left arm midline PICC with sonographic and fluoroscopic guidance. The catheter is ready for use. Read by: Ascencion Dike PA-C Electronically Signed   By: Sandi Mariscal M.D.   On: 09/10/2015 16:53   [place] [facility] Ob Follow Up  08/18/2015  OBSTETRICAL ULTRASOUND: This exam was performed within a [facility]. The OB US report was generated in the AS system, and faxed to the ordering physician.  This report is available in the [facility]. See the [facility] Obstetric US report via the Image Link.     Recent Results (from the past 240 hour(s))  Urine culture     Status: Abnormal   Collection Time: 09/07/15  4:16 PM  Result Value Ref Range Status   Specimen Description URINE, CLEAN CATCH  Final   Special Requests NONE  Final   Culture MULTIPLE SPECIES PRESENT, SUGGEST RECOLLECTION (A)  Final   Report Status 09/09/2015 FINAL  Final    BRIEF ADMITTING H & P: This is an opiate tolerant patient with Hb SS who presents to the ED with co Nausea, vomiting and diarrhea since this morning. She reports that she's had about 15 episodes of emesis and about 2 episodes of diarrhea. She also reports pain in the abdomen and upper left shoulder region. She rates pain as 8/10 and describes it as throbbing and occasionally sharp. She has been seen by Ob-rapid response team  per evaluation protocol from Obstetric service and is okay for admission to [facility] hospital. In the ED she received 3 doses of IV Dilaudid without any relief of pain. She is unable to receive NSAID's due to pregnancy. Her urine is negative for elements consistent with UTI.   She denies any fever, chills, contractions. Focal weakness, headaches or neck stiffness. IO am asked to admit her for dehydration and sickle cell crisis.   Hospital Course:  Present on Admission:  This is an opiate tolerant patient with hemoglobin SS who is admitted with acute sickle cell crisis, mild dehydration after she had nausea and vomiting and diarrhea for approximately 6 hours. Patient also has significant pain localized to the left shoulder and left arm. Additionally she is in the late stage of intrauterine pregnancy. In the hospital she was started on the Dilaudid PCA and clinician assisted doses of Dilaudid. She's unable to receive Toradol due to her pregnancy. She was also aggressively hydrated with IV fluids which led to resolution of her nausea vomiting and diarrhea. After day #1 in the hospital she is able to tolerate diet without any difficulty. She was progressively treated with IV Dilaudid and his pain resolved she was transitioned to oral analgesics. At the time of discharge the patient's pain was well-controlled on oral analgesics. The patient is in her late stage of pregnancy and at the time on the day of admission she was actually scheduled to have a partial exchange transfusion based upon her recent electrophoresis which showed her hemoglobin S to be greater than 60%. After discussion with her primary care provider and the severity of her pain in the hospital during describes it was decided to proceed with a partial exchange transfusion. The patient received 2 units of blood in partial exchange transfusion.  Her pregnancy was monitored by the [facility] team as an extension of the obstetric service. Pregnancy was  uneventful during this hospitalization.   Disposition and Follow-up: Patient is discharged home in stable condition. At the time of discharge the patient is ambulatory without any need for oxygen. She is to follow-up with her primary care physician in one to 2 days and with obstetrics as scheduled.     Discharge Instructions    Activity as tolerated - No restrictions    Complete by:  As directed      Diet general    Complete by:  As directed            DISCHARGE EXAM:  General: Alert, awake, oriented x3, in mild distress.  Vital signs: BP 112/75, HR 82, T 98 F (36.7 C), temperature source Oral, RR 19, height 5\' 2"  (1.575 m), weight 175 lb (79.379 kg), last menstrual period 02/09/2015, SpO2 98 % on RA, not currently breastfeeding. HEENT: [place]/AT PEERL, EOMI, anicteric Neck: Trachea midline, no masses, no thyromegal,y no JVD, no carotid bruit OROPHARYNX: Moist, No exudate/ erythema/lesions.  Heart: Regular rate and rhythm, without murmurs, rubs, gallops or S3. PMI non-displaced. Exam  reveals no decreased pulses. Pulmonary/Chest: Normal effort. Breath sounds normal. No. Apnea. Clear to auscultation,no stridor,  no wheezing and no rhonchi noted. No respiratory distress and no tenderness noted. Abdomen: Gravid Neuro: Alert and oriented to person, place and time. Normal motor skills, Displays no atrophy or tremors and exhibits normal muscle tone.  No focal neurological deficits noted cranial nerves II through XII grossly intact. No sensory deficit noted.  Strength at baseline in bilateral upper and lower extremities. Gait normal. Musculoskeletal: No warm swelling or erythema around joints, no spinal tenderness noted. Psychiatric: Patient alert and oriented x3, good insight and cognition, good recent to remote recall. Mood, memory, affect and judgement normal Lymph node survey: No cervical axillary or inguinal lymphadenopathy noted. Skin: Skin is warm and dry. No bruising, no ecchymosis and  no rash noted. Pt is not diaphoretic. No erythema. No pallor        Recent Labs  09/13/15 0543 09/14/15 0425  NA 135  --   K 3.7  --   CL 104  --   CO2 23  --   GLUCOSE 93  --   BUN <5*  --   CREATININE 0.44 0.50  CALCIUM 8.4*  --    No results for input(s): AST, ALT, ALKPHOS, BILITOT, PROT, ALBUMIN in the last 72 hours. No results for input(s): LIPASE, AMYLASE in the last 72 hours.  Recent Labs  09/13/15 0543 09/14/15 0425  WBC 16.0* 16.1*  NEUTROABS 12.3* 12.7*  HGB 7.8* 7.4*  HCT 22.6* 21.3*  MCV 88.3 87.7  PLT 280 313     Total time spent including face to face and decision making was greater than 30 minutes  Signed: MATTHEWS,MICHELLE A. 09/14/2015, 8:33 AM

## 2015-09-14 NOTE — Care Management Important Message (Signed)
Important Message  Patient Details  Name: [name] MRN: [id number] Date of Birth: 08-07-1990   Medicare Important Message Given:  Yes    Camillo Flaming 09/14/2015, 9:29 AMImportant Message  Patient Details  Name: [name] MRN: [id number] Date of Birth: [birth year]   Medicare Important Message Given:  Yes    Camillo Flaming 09/14/2015, 9:29 AM

## 2015-09-14 NOTE — Care Management Note (Signed)
Case Management Note  Patient Details  Name: [name] MRN: [id number] Date of Birth: 12/09/1990  Subjective/Objective: 25 y/o f admitted w/n/v. From home.                   Action/Plan:d/c home no needs or orders.   Expected Discharge Date:   (unknown)               Expected Discharge Plan:  Home/Self Care  In-House Referral:     Discharge planning Services  CM Consult  Post Acute Care Choice:    Choice offered to:     DME Arranged:    DME Agency:     HH Arranged:    [place] Agency:     Status of Service:  Completed, signed off  Medicare Important Message Given:  Yes Date Medicare IM Given:    Medicare IM give by:    Date Additional Medicare IM Given:    Additional Medicare Important Message give by:     If discussed at [place] of Stay Meetings, dates discussed:    Additional Comments:  Dessa Phi, RN 09/14/2015, 10:20 AM

## 2015-09-14 NOTE — Patient Outreach (Signed)
Member readmitted on 0000000 for complications of sickle cell & pregnancy, discharged on today, 5/1.  Will restart transition of care program tomorrow.  Valente David, BSN, [place] Management  [facility] Care Manager [phone]

## 2015-09-15 ENCOUNTER — Ambulatory Visit: Payer: Self-pay | Admitting: *Deleted

## 2015-09-15 ENCOUNTER — Ambulatory Visit (HOSPITAL_COMMUNITY): Admission: RE | Admit: 2015-09-15 | Payer: Medicare Other | Source: Ambulatory Visit

## 2015-09-16 ENCOUNTER — Other Ambulatory Visit: Payer: Self-pay | Admitting: *Deleted

## 2015-09-16 ENCOUNTER — Ambulatory Visit: Payer: Self-pay | Admitting: *Deleted

## 2015-09-16 NOTE — Patient Outreach (Signed)
Member recently discharged (5/1) after being readmitted to hospital with abdominal pain.  Call placed to restart transition of care program, no answer.  HIPPA compliant voice message left.  Will await call back.  Will follow up next week if no call returned.  Valente David, BSN, [place] Management  [facility] Care Manager [phone]

## 2015-09-17 ENCOUNTER — Other Ambulatory Visit: Payer: Self-pay | Admitting: *Deleted

## 2015-09-17 NOTE — Patient Outreach (Addendum)
Call placed to restart transition of care program and complete initial assessment, no answer. Second attempt since discharge.  HIPPA compliant voice message left. Will await call back. Will follow up next week if no call returned.  Valente David, BSN, [place] Management  [facility] Care Manager [phone]

## 2015-09-21 ENCOUNTER — Encounter: Payer: Self-pay | Admitting: *Deleted

## 2015-09-21 ENCOUNTER — Other Ambulatory Visit: Payer: Self-pay | Admitting: *Deleted

## 2015-09-21 NOTE — Patient Outreach (Signed)
Call placed to member again to restart transition of care and to complete initial assessment, no answer. HIPPA compliant voice message left. Will await call back.  This is the third consecutive unsuccessful attempt to contact member. Will send outreach letter, if no response in 10 days, will close case for inability to maintain contact.  Valente David, BSN, [place] Management  [facility] Care Manager [phone]

## 2015-09-22 ENCOUNTER — Other Ambulatory Visit (HOSPITAL_COMMUNITY): Payer: Self-pay | Admitting: Obstetrics and Gynecology

## 2015-09-22 ENCOUNTER — Ambulatory Visit (HOSPITAL_COMMUNITY)
Admission: RE | Admit: 2015-09-22 | Discharge: 2015-09-22 | Disposition: A | Discharge status: Home / Self Care | Payer: Medicare Other | Source: Ambulatory Visit | Attending: Internal Medicine | Admitting: Internal Medicine

## 2015-09-22 ENCOUNTER — Encounter (HOSPITAL_COMMUNITY): Payer: Self-pay

## 2015-09-22 ENCOUNTER — Encounter: Payer: Self-pay | Admitting: Internal Medicine

## 2015-09-22 ENCOUNTER — Telehealth: Payer: Self-pay

## 2015-09-22 ENCOUNTER — Inpatient Hospital Stay (HOSPITAL_COMMUNITY)
Admission: AD | Admit: 2015-09-22 | Discharge: 2015-09-27 | DRG: 781 | Disposition: A | Discharge status: Home / Self Care | Payer: Medicare Other | Source: Ambulatory Visit | Attending: Internal Medicine | Admitting: Internal Medicine

## 2015-09-22 ENCOUNTER — Ambulatory Visit (INDEPENDENT_AMBULATORY_CARE_PROVIDER_SITE_OTHER): Payer: Medicare Other | Admitting: Internal Medicine

## 2015-09-22 ENCOUNTER — Encounter (HOSPITAL_COMMUNITY): Payer: Self-pay | Admitting: *Deleted

## 2015-09-22 VITALS — BP 99/68 | HR 114 | Temp 98.8°F | Resp 18 | Ht 62.0 in | Wt 172.0 lb

## 2015-09-22 DIAGNOSIS — O98513 Other viral diseases complicating pregnancy, third trimester: Secondary | ICD-10-CM | POA: Insufficient documentation

## 2015-09-22 DIAGNOSIS — O99013 Anemia complicating pregnancy, third trimester: Principal | ICD-10-CM | POA: Diagnosis present

## 2015-09-22 DIAGNOSIS — M549 Dorsalgia, unspecified: Secondary | ICD-10-CM | POA: Diagnosis not present

## 2015-09-22 DIAGNOSIS — D5700 Hb-SS disease with crisis, unspecified: Secondary | ICD-10-CM | POA: Diagnosis not present

## 2015-09-22 DIAGNOSIS — B009 Herpesviral infection, unspecified: Secondary | ICD-10-CM

## 2015-09-22 DIAGNOSIS — G8929 Other chronic pain: Secondary | ICD-10-CM | POA: Diagnosis not present

## 2015-09-22 DIAGNOSIS — D571 Sickle-cell disease without crisis: Secondary | ICD-10-CM

## 2015-09-22 DIAGNOSIS — O99323 Drug use complicating pregnancy, third trimester: Secondary | ICD-10-CM

## 2015-09-22 DIAGNOSIS — Z79891 Long term (current) use of opiate analgesic: Secondary | ICD-10-CM | POA: Diagnosis not present

## 2015-09-22 DIAGNOSIS — Z79899 Other long term (current) drug therapy: Secondary | ICD-10-CM

## 2015-09-22 DIAGNOSIS — Z8249 Family history of ischemic heart disease and other diseases of the circulatory system: Secondary | ICD-10-CM | POA: Diagnosis not present

## 2015-09-22 DIAGNOSIS — Z87891 Personal history of nicotine dependence: Secondary | ICD-10-CM | POA: Diagnosis not present

## 2015-09-22 DIAGNOSIS — Z3A32 32 weeks gestation of pregnancy: Secondary | ICD-10-CM | POA: Diagnosis not present

## 2015-09-22 DIAGNOSIS — O98313 Other infections with a predominantly sexual mode of transmission complicating pregnancy, third trimester: Secondary | ICD-10-CM | POA: Diagnosis present

## 2015-09-22 DIAGNOSIS — K219 Gastro-esophageal reflux disease without esophagitis: Secondary | ICD-10-CM | POA: Diagnosis not present

## 2015-09-22 DIAGNOSIS — F1120 Opioid dependence, uncomplicated: Secondary | ICD-10-CM

## 2015-09-22 DIAGNOSIS — Z91018 Allergy to other foods: Secondary | ICD-10-CM | POA: Diagnosis not present

## 2015-09-22 DIAGNOSIS — A6000 Herpesviral infection of urogenital system, unspecified: Secondary | ICD-10-CM | POA: Diagnosis present

## 2015-09-22 DIAGNOSIS — D638 Anemia in other chronic diseases classified elsewhere: Secondary | ICD-10-CM | POA: Diagnosis present

## 2015-09-22 DIAGNOSIS — Z331 Pregnant state, incidental: Secondary | ICD-10-CM

## 2015-09-22 DIAGNOSIS — F1910 Other psychoactive substance abuse, uncomplicated: Secondary | ICD-10-CM

## 2015-09-22 DIAGNOSIS — O98519 Other viral diseases complicating pregnancy, unspecified trimester: Secondary | ICD-10-CM

## 2015-09-22 DIAGNOSIS — Z833 Family history of diabetes mellitus: Secondary | ICD-10-CM | POA: Diagnosis not present

## 2015-09-22 DIAGNOSIS — Z91040 Latex allergy status: Secondary | ICD-10-CM | POA: Diagnosis not present

## 2015-09-22 DIAGNOSIS — F1110 Opioid abuse, uncomplicated: Secondary | ICD-10-CM

## 2015-09-22 DIAGNOSIS — D72829 Elevated white blood cell count, unspecified: Secondary | ICD-10-CM | POA: Diagnosis present

## 2015-09-22 DIAGNOSIS — O0933 Supervision of pregnancy with insufficient antenatal care, third trimester: Secondary | ICD-10-CM

## 2015-09-22 DIAGNOSIS — O0990 Supervision of high risk pregnancy, unspecified, unspecified trimester: Secondary | ICD-10-CM

## 2015-09-22 DIAGNOSIS — Z3493 Encounter for supervision of normal pregnancy, unspecified, third trimester: Secondary | ICD-10-CM

## 2015-09-22 DIAGNOSIS — O99019 Anemia complicating pregnancy, unspecified trimester: Secondary | ICD-10-CM

## 2015-09-22 HISTORY — DX: Herpesviral infection of urogenital system, unspecified: A60.00

## 2015-09-22 LAB — CBC
HCT: 22.8 % — ABNORMAL LOW (ref 36.0–46.0)
HEMOGLOBIN: 7.9 g/dL — AB (ref 12.0–15.0)
MCH: 29.9 pg (ref 26.0–34.0)
MCHC: 34.6 g/dL (ref 30.0–36.0)
MCV: 86.4 fL (ref 78.0–100.0)
Platelets: 590 10*3/uL — ABNORMAL HIGH (ref 150–400)
RBC: 2.64 MIL/uL — AB (ref 3.87–5.11)
RDW: 21 % — ABNORMAL HIGH (ref 11.5–15.5)
WBC: 15.1 10*3/uL — ABNORMAL HIGH (ref 4.0–10.5)

## 2015-09-22 LAB — CBC WITH DIFFERENTIAL/PLATELET
BASOS ABS: 0 {cells}/uL (ref 0–200)
BASOS PCT: 0 %
EOS ABS: 151 {cells}/uL (ref 15–500)
Eosinophils Relative: 1 %
HCT: 25.1 % — ABNORMAL LOW (ref 35.0–45.0)
Hemoglobin: 8.2 g/dL — ABNORMAL LOW (ref 11.7–15.5)
Lymphocytes Relative: 8 %
Lymphs Abs: 1208 cells/uL (ref 850–3900)
MCH: 29.7 pg (ref 27.0–33.0)
MCHC: 32.7 g/dL (ref 32.0–36.0)
MCV: 90.9 fL (ref 80.0–100.0)
MONOS PCT: 7 %
MPV: 9.1 fL (ref 7.5–12.5)
Monocytes Absolute: 1057 cells/uL — ABNORMAL HIGH (ref 200–950)
Neutro Abs: 12684 cells/uL — ABNORMAL HIGH (ref 1500–7800)
Neutrophils Relative %: 84 %
PLATELETS: 562 10*3/uL — AB (ref 140–400)
RBC: 2.76 MIL/uL — ABNORMAL LOW (ref 3.80–5.10)
RDW: 22 % — AB (ref 11.0–15.0)
WBC: 15.1 10*3/uL — ABNORMAL HIGH (ref 3.8–10.8)

## 2015-09-22 LAB — TYPE AND SCREEN
ABO/RH(D): B POS
ANTIBODY SCREEN: NEGATIVE

## 2015-09-22 MED ORDER — HYDROMORPHONE 1 MG/ML IV SOLN
INTRAVENOUS | Status: DC
  Administered 2015-09-23: 1.7 mg via INTRAVENOUS
  Administered 2015-09-23: 1.8 mg via INTRAVENOUS
  Administered 2015-09-23: 01:00:00 via INTRAVENOUS
  Administered 2015-09-23: 3.3 mg via INTRAVENOUS
  Administered 2015-09-23: 0.9 mg via INTRAVENOUS
  Filled 2015-09-22: qty 25

## 2015-09-22 MED ORDER — CALCIUM CARBONATE ANTACID 500 MG PO CHEW
2.0000 | CHEWABLE_TABLET | ORAL | Status: DC | PRN
  Filled 2015-09-22: qty 2

## 2015-09-22 MED ORDER — ONDANSETRON HCL 4 MG/2ML IJ SOLN: 4.0000 mg | Freq: Four times a day (QID) | INTRAMUSCULAR | Status: DC | PRN

## 2015-09-22 MED ORDER — COMPLETENATE 29-1 MG PO CHEW
1.0000 | CHEWABLE_TABLET | Freq: Every day | ORAL | Status: DC
Start: 2015-09-23 — End: 2015-09-27
  Administered 2015-09-23 – 2015-09-26 (×4): 1 via ORAL
  Filled 2015-09-22 (×6): qty 1

## 2015-09-22 MED ORDER — LACTATED RINGERS IV SOLN
INTRAVENOUS | Status: DC
  Administered 2015-09-22: 125 mL/h via INTRAVENOUS

## 2015-09-22 MED ORDER — DIPHENHYDRAMINE HCL 12.5 MG/5ML PO ELIX
12.5000 mg | ORAL_SOLUTION | Freq: Four times a day (QID) | ORAL | Status: DC | PRN
  Filled 2015-09-22: qty 5

## 2015-09-22 MED ORDER — NALOXONE HCL 0.4 MG/ML IJ SOLN: 0.4000 mg | INTRAMUSCULAR | Status: DC | PRN

## 2015-09-22 MED ORDER — DEXTROSE-NACL 5-0.45 % IV SOLN
INTRAVENOUS | Status: DC
  Administered 2015-09-22: 100 mL/h via INTRAVENOUS
  Administered 2015-09-23 (×2): via INTRAVENOUS
  Administered 2015-09-23 – 2015-09-26 (×4): 1000 mL via INTRAVENOUS
  Administered 2015-09-26 (×2): via INTRAVENOUS

## 2015-09-22 MED ORDER — ZOLPIDEM TARTRATE 5 MG PO TABS: 5.0000 mg | ORAL_TABLET | Freq: Every evening | ORAL | Status: DC | PRN

## 2015-09-22 MED ORDER — DIPHENHYDRAMINE HCL 50 MG/ML IJ SOLN
INTRAMUSCULAR | Status: AC
  Filled 2015-09-22: qty 1

## 2015-09-22 MED ORDER — FOLIC ACID 1 MG PO TABS
1.0000 mg | ORAL_TABLET | Freq: Every day | ORAL | Status: DC
  Administered 2015-09-23 – 2015-09-27 (×5): 1 mg via ORAL
  Filled 2015-09-22 (×6): qty 1

## 2015-09-22 MED ORDER — SODIUM CHLORIDE 0.9% FLUSH: 9.0000 mL | INTRAVENOUS | Status: DC | PRN

## 2015-09-22 MED ORDER — DIPHENHYDRAMINE HCL 50 MG/ML IJ SOLN: 12.5000 mg | Freq: Four times a day (QID) | INTRAMUSCULAR | Status: DC | PRN

## 2015-09-22 MED ORDER — HYDROMORPHONE HCL 2 MG/ML IJ SOLN
2.0000 mg | INTRAMUSCULAR | Status: DC | PRN
  Administered 2015-09-22 – 2015-09-23 (×7): 2 mg via INTRAVENOUS
  Filled 2015-09-22 (×7): qty 1

## 2015-09-22 MED ORDER — ENOXAPARIN SODIUM 40 MG/0.4ML ~~LOC~~ SOLN
40.0000 mg | SUBCUTANEOUS | Status: DC
  Administered 2015-09-23: 40 mg via SUBCUTANEOUS
  Filled 2015-09-22 (×6): qty 0.4

## 2015-09-22 MED ORDER — HYDROMORPHONE HCL 2 MG/ML IJ SOLN: 2.0000 mg | INTRAMUSCULAR | Status: DC | PRN

## 2015-09-22 MED ORDER — ACETAMINOPHEN 500 MG PO TABS: 1000.0000 mg | ORAL_TABLET | Freq: Four times a day (QID) | ORAL | Status: DC | PRN

## 2015-09-22 MED ORDER — DIPHENHYDRAMINE HCL 50 MG/ML IJ SOLN
25.0000 mg | Freq: Four times a day (QID) | INTRAMUSCULAR | Status: DC | PRN
Start: 2015-09-22 — End: 2015-09-23
  Administered 2015-09-22: 25 mg via INTRAVENOUS
  Administered 2015-09-23: 50 mg via INTRAVENOUS
  Filled 2015-09-22: qty 1

## 2015-09-22 MED ORDER — DIPHENHYDRAMINE HCL 25 MG PO CAPS
25.0000 mg | ORAL_CAPSULE | ORAL | Status: DC | PRN
  Administered 2015-09-23: 25 mg via ORAL
  Filled 2015-09-22: qty 1

## 2015-09-22 MED ORDER — DIPHENHYDRAMINE HCL 12.5 MG/5ML PO ELIX
25.0000 mg | ORAL_SOLUTION | Freq: Four times a day (QID) | ORAL | Status: DC | PRN
  Filled 2015-09-22: qty 20

## 2015-09-22 MED ORDER — DOCUSATE SODIUM 100 MG PO CAPS
100.0000 mg | ORAL_CAPSULE | Freq: Every day | ORAL | Status: DC
  Administered 2015-09-23: 100 mg via ORAL
  Filled 2015-09-22 (×3): qty 1

## 2015-09-22 MED ORDER — CVS PRENATAL GUMMY 0.4-113.5 MG PO CHEW: 2.0000 | CHEWABLE_TABLET | Freq: Every day | ORAL | Status: DC

## 2015-09-22 MED ORDER — OXYCODONE HCL 5 MG PO TABS: 15.0000 mg | ORAL_TABLET | ORAL | Status: DC | PRN

## 2015-09-22 MED ORDER — HYDROMORPHONE 1 MG/ML IV SOLN: INTRAVENOUS | Status: DC

## 2015-09-22 MED ORDER — METHADONE HCL 5 MG PO TABS
5.0000 mg | ORAL_TABLET | Freq: Every day | ORAL | Status: DC
Start: 2015-09-22 — End: 2015-09-27
  Administered 2015-09-22 – 2015-09-26 (×5): 5 mg via ORAL
  Filled 2015-09-22 (×5): qty 1

## 2015-09-22 NOTE — Patient Instructions (Signed)
Sickle Cell Anemia, Adult Sickle cell anemia is a condition in which red blood cells have an abnormal "sickle" shape. This abnormal shape shortens the cells' life span, which results in a lower than normal concentration of red blood cells in the blood. The sickle shape also causes the cells to clump together and block free blood flow through the blood vessels. As a result, the tissues and organs of the body do not receive enough oxygen. Sickle cell anemia causes organ damage and pain and increases the risk of infection. CAUSES  Sickle cell anemia is a genetic disorder. Those who receive two copies of the gene have the condition, and those who receive one copy have the trait. RISK FACTORS The sickle cell gene is most common in people whose families originated in [place]. Other areas of the globe where sickle cell trait occurs include the Mediterranean, [place] and [place], the [place], and the [place].  SIGNS AND SYMPTOMS  Pain, especially in the extremities, back, chest, or abdomen (common). The pain may start suddenly or may develop following an illness, especially if there is dehydration. Pain can also occur due to overexertion or exposure to extreme temperature changes.  Frequent severe bacterial infections, especially certain types of pneumonia and meningitis.  Pain and swelling in the hands and feet.  Decreased activity.   Loss of appetite.   Change in behavior.  Headaches.  Seizures.  Shortness of breath or difficulty breathing.  Vision changes.  Skin ulcers. Those with the trait may not have symptoms or they may have mild symptoms.  DIAGNOSIS  Sickle cell anemia is diagnosed with blood tests that demonstrate the genetic trait. It is often diagnosed during the newborn period, due to mandatory testing nationwide. A variety of blood tests, X-rays, CT scans, MRI scans, ultrasounds, and lung function tests may also be done to monitor the condition. TREATMENT  Sickle  cell anemia may be treated with:  Medicines. You may be given pain medicines, antibiotic medicines (to treat and prevent infections) or medicines to increase the production of certain types of hemoglobin.  Fluids.  Oxygen.  Blood transfusions. HOME CARE INSTRUCTIONS   Drink enough fluid to keep your urine clear or pale yellow. Increase your fluid intake in hot weather and during exercise.  Do not smoke. Smoking lowers oxygen levels in the blood.   Only take over-the-counter or prescription medicines for pain, fever, or discomfort as directed by your health care provider.  Take antibiotics as directed by your health care provider. Make sure you finish them it even if you start to feel better.   Take supplements as directed by your health care provider.   Consider wearing a medical alert bracelet. This tells anyone caring for you in an emergency of your condition.   When traveling, keep your medical information, health care provider's names, and the medicines you take with you at all times.   If you develop a fever, do not take medicines to reduce the fever right away. This could cover up a problem that is developing. Notify your health care provider.  Keep all follow-up appointments with your health care provider. Sickle cell anemia requires regular medical care. SEEK MEDICAL CARE IF: You have a fever. SEEK IMMEDIATE MEDICAL CARE IF:   You feel dizzy or faint.   You have new abdominal pain, especially on the left side near the stomach area.   You develop a persistent, often uncomfortable and painful penile erection (priapism). If this is not treated immediately it   will lead to impotence.   You have numbness your arms or legs or you have a hard time moving them.   You have a hard time with speech.   You have a fever or persistent symptoms for more than 2-3 days.   You have a fever and your symptoms suddenly get worse.   You have signs or symptoms of infection.  These include:   Chills.   Abnormal tiredness (lethargy).   Irritability.   Poor eating.   Vomiting.   You develop pain that is not helped with medicine.   You develop shortness of breath.  You have pain in your chest.   You are coughing up pus-like or bloody sputum.   You develop a stiff neck.  Your feet or hands swell or have pain.  Your abdomen appears bloated.  You develop joint pain. MAKE SURE YOU:  Understand these instructions.   This information is not intended to replace advice given to you by your health care provider. Make sure you discuss any questions you have with your health care provider.   Document Released: 08/10/2005 Document Revised: 05/23/2014 Document Reviewed: 12/12/2012 Elsevier Interactive Patient Education 2016 [employer]  

## 2015-09-22 NOTE — MAU Note (Signed)
Fetal monitors dc'd per Dr Octaviano Glow reactive

## 2015-09-22 NOTE — Telephone Encounter (Signed)
Pt is requesting a medication refill for Oxycodone, 15mg . Thanks!

## 2015-09-22 NOTE — ED Notes (Signed)
Pt has had no prenatal care, only seeing internal medicine.  Informed patient the importance of prenatal care.  Appointment made in [facility] for 09/24/15 @ 9:30.  Pt made aware.

## 2015-09-22 NOTE — Progress Notes (Signed)
Patient ID: [name], female   DOB: 06-28-1990, 25 y.o.   MRN: [id number]   [name], is a 25 y.o. female  [id number]  [id number]  DOB - 1990/05/28  No chief complaint on file.       Subjective:   [name] is a 25 y.o. female with sickle cell anemia in the third trimester of her second pregnancy here today for a hospital discharge follow up visit. She also has appointment with obstetrician today for regular antenatal follow-up, she is now has weekly appointments. She has no complaint today. She needs blood draw for her hemoglobinopathy evaluation. She is compliant with her medications. Patient has No headache, No chest pain, No abdominal pain - No Nausea, No new weakness tingling or numbness, No Cough - SOB.  No problems updated.  ALLERGIES: Allergies  Allergen Reactions  . Carrot [Daucus [place]] Hives, Swelling and Rash    Dietary:  Please do not send any form of carrot to patient  . Carrot Oil Hives and Swelling  . Latex Rash    *powdered Latex*     PAST MEDICAL HISTORY: Past Medical History  Diagnosis Date  . Miscarriage 03/22/2011    Pt reports 2 miscarriages.  . Depression 01/06/2011  . GERD (gastroesophageal reflux disease) 02/17/2011  . Trichotillomania     h/o  . Blood transfusion     "lots"  . Sickle cell anemia with crisis ([place])   . Exertional dyspnea     "sometimes"  . Migraines 11/08/11    "@ least twice/month"  . Chronic back pain     "very severe; have knot in my back; from tight muscle; take RX and exercise for it"  . Mood swings ([place]) 11/08/11    "I go back and forth; real bad"  . Sickle cell anemia ([name])   . Blood transfusion without reported diagnosis     MEDICATIONS AT HOME: Prior to Admission medications   Medication Sig Start Date End Date Taking? Authorizing Provider  acetaminophen (TYLENOL) 500 MG tablet Take 1,000 mg by mouth daily as needed for moderate pain or headache.    Historical Provider, MD  folic acid  (FOLVITE) 1 MG tablet Take 1 mg by mouth daily.    Historical Provider, MD  methadone (DOLOPHINE) 5 MG tablet Take 1 tablet (5 mg total) by mouth at bedtime. 09/01/15   Tresa Garter, MD  oxyCODONE (ROXICODONE) 15 MG immediate release tablet Take 1 tablet (15 mg total) by mouth every 4 (four) hours as needed for pain. 09/13/15   Tresa Garter, MD  Prenatal Vit-Min-FA-Fish Oil (CVS PRENATAL GUMMY) 0.4-113.5 MG CHEW Chew 2-3 each by mouth daily.    Historical Provider, MD     Objective:   There were no vitals filed for this visit.  Exam General appearance : Awake, alert, not in any distress. Speech Clear. Not toxic looking HEENT: Atraumatic and Normocephalic, pupils equally reactive to light and accomodation Neck: supple, no JVD. No cervical lymphadenopathy.  Chest:Good air entry bilaterally, no added sounds  CVS: S1 S2 regular, no murmurs.  Abdomen: Gravidly distended, Bowel sounds present, Non tender, no gaurding, rigidity or rebound. Extremities: B/L Lower Ext shows no edema, both legs are warm to touch Neurology: Awake alert, and oriented X 3, CN II-XII intact, Non focal Skin:No Rash  Data Review No results found for: HGBA1C   Assessment & Plan   1. Third trimester pregnancy  - CBC with Differential - Hemoglobinopathy evaluation  2. Hb-SS disease without  crisis ([place])  - CBC with Differential - Hemoglobinopathy evaluation  Patient have been counseled extensively about nutrition and exercise  Return in about 4 weeks (around 10/20/2015) for Sickle Cell Disease/Pain.  The patient was given clear instructions to go to ER or return to medical center if symptoms don't improve, worsen or new problems develop. The patient verbalized understanding. The patient was told to call to get lab results if they haven't heard anything in the next week.   This note has been created with Surveyor, quantity. Any transcriptional errors are  unintentional.    Angelica Chessman, MD, [place], Karilyn Cota, [place] and [place], [place]   09/22/2015, 10:41 AM

## 2015-09-22 NOTE — MAU Note (Signed)
HELPED GET PT OUT OF CAR-   SAYS  [facility]  AND [facility] -  GOES  TO [facility]- WAS THERE  THIS  AM.    NO  VE -   SAYS  LEFT SHOULDER IS  PAINFUL.

## 2015-09-22 NOTE — H&P (Signed)
FACULTY PRACTICE ANTEPARTUM ADMISSION HISTORY AND PHYSICAL NOTE   History of Present Illness: [name] is a 25 y.o. G3P1011 at [redacted]w[redacted]d admitted for sickle cell crisis.  Pain is located in her left shoulder, back and abdomen.  Was recently discharged for similar symptoms from [facility] on 09/14/15; has had multiple admissions this pregnancy for these symptoms.  No acute obstetric concerns. Patient reports the fetal movement as active. Patient reports uterine contraction  activity as none. Patient reports  vaginal bleeding as none. Patient describes fluid per vagina as None. Fetal presentation is unsure.  Patient Active Problem List   Diagnosis Date Noted  . Nausea and vomiting 09/07/2015  . Dehydration 09/07/2015  . Third trimester pregnancy 09/01/2015  . High risk for intrapartum complications, antepartum 08/12/2015  . Sickle cell anemia ([place]) 08/10/2015  . Chronic pain 08/04/2015  . Cocaine use 07/24/2015  . 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 Sickle cell anemia with pain ([place]) 07/24/2015  . Maternal sickle cell anemia affecting pregnancy, antepartum ([place]) 07/14/2015  . Sickle cell pain crisis ([place]) 07/12/2015  . Second trimester pregnancy 07/07/2015  . 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 Anemia of chronic disease   . Leukocytosis   . Mood swings ([place]) 11/08/2011  . Migraines 11/08/2011  . GERD (gastroesophageal reflux disease) 02/17/2011  . Depression 01/06/2011  . Sickle cell disease ([place]) 01/08/2009  . Trichotillomania 01/08/2009    Past Medical History  Diagnosis Date  . Miscarriage 03/22/2011    Pt reports 2 miscarriages.  . Depression 01/06/2011  . GERD (gastroesophageal reflux disease) 02/17/2011  . Trichotillomania     h/o  . Blood transfusion     "lots"  . Sickle cell anemia with crisis ([place])   . Exertional dyspnea     "sometimes"  . Migraines 11/08/11    "@ least twice/month"  . Chronic back pain     "very severe; have knot in my back; from tight  muscle; take RX and exercise for it"  . Mood swings ([place]) 11/08/11    "I go back and forth; real bad"  . Sickle cell anemia ([name])   . Blood transfusion without reported diagnosis   . Genital HSV     Past Surgical History  Procedure Laterality Date  . Cholecystectomy  05/2010  . Dilation and curettage of uterus  02/20/11    S/P miscarriage    OB History  Gravida Para Term Preterm AB SAB TAB Ectopic Multiple Living  3 1 1  1 1    0 1    # Outcome Date GA Lbr Len/2nd Weight Sex Delivery Anes PTL Lv  3 Current           2 Term 12/07/14 [redacted]w[redacted]d / 00:08 5 lb 4.1 oz (2.384 kg) M Vag-Spont EPI  Y  1 SAB 02/20/11             Comments: System Generated. Please review and update pregnancy details.    Obstetric Comments  Miscarried in October 2012 at about 7 weeks    Social History   Social History  . Marital Status: Single    Spouse Name: N/A  . Number of Children: N/A  . Years of Education: N/A   Social History Main Topics  . Smoking status: Former Smoker -- 0.25 packs/day for 1 years    Types: Cigarettes    Quit date: 03/25/2013  . Smokeless tobacco: Never Used  . Alcohol Use: No     Comment: pt states she quit marijuan  in May 2013. Rare ETOH, + cigarettes.  She is enrolled in school  . 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 Sexual Activity: Yes    Birth Control/ Protection: None     Comment: last sex Jul 11 2015   Other Topics Concern  . 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 Diabetes Maternal Grandmother   . Diabetes Paternal Grandmother   . Hypertension Paternal Grandmother   . Diabetes Maternal Grandfather   . Sickle cell trait Mother   . Sickle cell trait Father     Allergies  Allergen Reactions  . Carrot [Daucus Carota] Hives, Swelling and Rash    Dietary:  Please do not send any form of carrot to patient   . Carrot Oil Hives and Swelling  . Latex Rash    *powdered Latex*     Prescriptions prior to admission  Medication Sig Dispense Refill Last Dose  . calcium carbonate (TUMS - DOSED IN MG ELEMENTAL CALCIUM) 500 MG chewable tablet Chew 2 tablets by mouth 2 (two) times daily as needed for indigestion or heartburn.   Past Week at Unknown time  . folic acid (FOLVITE) 1 MG tablet Take 1 mg by mouth daily.   09/22/2015 at Unknown time  . methadone (DOLOPHINE) 5 MG tablet Take 1 tablet (5 mg total) by mouth at bedtime. 30 tablet 0 09/21/2015 at Unknown time  . oxyCODONE (ROXICODONE) 15 MG immediate release tablet Take 1 tablet (15 mg total) by mouth every 4 (four) hours as needed for pain. 90 tablet 0 09/22/2015 at Unknown time  . Prenatal Vit-Fe Fumarate-FA (PRENATAL MULTIVITAMIN) TABS tablet Take 1 tablet by mouth daily at 12 noon.   09/22/2015 at Unknown time    Review of Systems - Negative except what is mentioned in HPI  Vitals:  BP 110/71 mmHg  Pulse 117  Resp 24  SpO2 98%  LMP 02/09/2015 Physical Examination: CONSTITUTIONAL: Well-developed, well-nourished female in no acute distress.  HENT:  Normocephalic, atraumatic, External right and left ear normal. Oropharynx is clear and moist EYES: Conjunctivae and EOM are normal. Pupils are equal, round, and reactive to light. No scleral icterus.  NECK: Normal range of motion, supple, no masses SKIN: Skin is warm and dry. No rash noted. Not diaphoretic. No erythema. No pallor. [place]: Alert and oriented to person, place, and time. Normal reflexes, muscle tone coordination. No cranial nerve deficit noted. PSYCHIATRIC: Normal mood and affect. Normal behavior. Normal judgment and thought content. CARDIOVASCULAR: Normal heart rate noted, regular rhythm RESPIRATORY: Effort and breath sounds normal, no problems with respiration noted ABDOMEN: Soft, nontender, nondistended, gravid. MUSCULOSKELETAL: Normal range of motion. No edema and no tenderness. 2+  distal pulses.  Cervix: Not evaluated.  Membranes:intact Fetal Monitoring:Baseline: 145 bpm, Variability: Moderate {> 6 bpm), Accelerations: Reactive and Decelerations: Absent Tocometer: Flat  Labs:  Results for orders placed or performed during the hospital encounter of 09/22/15 (from the past 24 hour(s))  CBC   Collection Time: 09/22/15  8:03 PM  Result Value Ref Range   WBC 15.1 (H) 4.0 - 10.5 K/uL   RBC 2.64 (L) 3.87 - 5.11 MIL/uL   Hemoglobin 7.9 (L) 12.0 - 15.0 g/dL   HCT 22.8 (L) 36.0 - 46.0 %   MCV 86.4 78.0 - 100.0 fL  MCH 29.9 26.0 - 34.0 pg   MCHC 34.6 30.0 - 36.0 g/dL   RDW 21.0 (H) 11.5 - 15.5 %   Platelets 590 (H) 150 - 400 K/uL    Imaging Studies: Dg Chest 2 View  09/07/2015  CLINICAL DATA:  Abdominal pain starting 5:30 this morning, history of sickle cell disease, [redacted] weeks pregnant EXAM: CHEST  2 VIEW COMPARISON:  08/11/2015 FINDINGS: Cardiomediastinal silhouette is stable. No acute infiltrate or pleural effusion. No pulmonary edema. Stable pleural parenchymal scarring in right upper lobe. Stable small calcified granulomas. Stable chronic sclerotic changes bilateral humeral heads consistent with sickle cell sequela. IMPRESSION: No acute infiltrate or pleural effusion. No pulmonary edema. Stable pleural parenchymal scarring in right upper lobe. Stable small calcified granulomas. Electronically Signed   By: Lahoma Crocker M.D.   On: 09/07/2015 09:24   Ir Fluoro Guide Cv Line Left  09/10/2015  INDICATION: Poor venous access. Sickle cell anemia. Request PICC line placement. EXAM: LEFT UPPER EXTREMITY MIDLINE PICC LINE PLACEMENT WITH ULTRASOUND AND FLUOROSCOPIC GUIDANCE MEDICATIONS: None; The antibiotic was administered within an appropriate time interval prior to skin puncture. ANESTHESIA/SEDATION: Moderate Sedation Time:  None The patient was continuously monitored during the procedure by the interventional radiology nurse under my direct supervision. FLUOROSCOPY TIME:   Fluoroscopy Time: 9 minutes 36 seconds COMPLICATIONS: None immediate. PROCEDURE: The patient was advised of the possible risks and complications and agreed to undergo the procedure. The patient was then brought to the angiographic suite for the procedure. The left arm was prepped with chlorhexidine, draped in the usual sterile fashion using maximum barrier technique (cap and mask, sterile gown, sterile gloves, large sterile sheet, hand hygiene and cutaneous antiseptic). Local anesthesia was attained by infiltration with 1% lidocaine. Ultrasound demonstrated patency of the brachial vein, and this was documented with an image. Under real-time ultrasound guidance, this vein was accessed with a 21 gauge micropuncture needle and image documentation was performed. The needle was exchanged over a guidewire for a peel-away sheath, however the catheter could not be advanced into the SVC despite multiple attempts. Ultimately, the decision was made to leave the catheter as a midline catheter and a 24 cm 5 French dual lumen PICC was advanced, and positioned with its tip at the mid left subclavian. Fluoroscopy during the procedure and fluoro spot radiograph confirms appropriate catheter position. The catheter was flushed, secured to the skin, and covered with a sterile dressing. IMPRESSION: Successful placement of a left arm midline PICC with sonographic and fluoroscopic guidance. The catheter is ready for use. Read by: Ascencion Dike PA-C Electronically Signed   By: Sandi Mariscal M.D.   On: 09/10/2015 16:53   Ir US Guide Vasc Access Left  09/10/2015  INDICATION: Poor venous access. Sickle cell anemia. Request PICC line placement. EXAM: LEFT UPPER EXTREMITY MIDLINE PICC LINE PLACEMENT WITH ULTRASOUND AND FLUOROSCOPIC GUIDANCE MEDICATIONS: None; The antibiotic was administered within an appropriate time interval prior to skin puncture. ANESTHESIA/SEDATION: Moderate Sedation Time:  None The patient was continuously monitored  during the procedure by the interventional radiology nurse under my direct supervision. FLUOROSCOPY TIME:  Fluoroscopy Time: 9 minutes 36 seconds COMPLICATIONS: None immediate. PROCEDURE: The patient was advised of the possible risks and complications and agreed to undergo the procedure. The patient was then brought to the angiographic suite for the procedure. The left arm was prepped with chlorhexidine, draped in the usual sterile fashion using maximum barrier technique (cap and mask, sterile gown, sterile gloves, large sterile sheet, hand hygiene and  cutaneous antiseptic). Local anesthesia was attained by infiltration with 1% lidocaine. Ultrasound demonstrated patency of the brachial vein, and this was documented with an image. Under real-time ultrasound guidance, this vein was accessed with a 21 gauge micropuncture needle and image documentation was performed. The needle was exchanged over a guidewire for a peel-away sheath, however the catheter could not be advanced into the SVC despite multiple attempts. Ultimately, the decision was made to leave the catheter as a midline catheter and a 24 cm 5 French dual lumen PICC was advanced, and positioned with its tip at the mid left subclavian. Fluoroscopy during the procedure and fluoro spot radiograph confirms appropriate catheter position. The catheter was flushed, secured to the skin, and covered with a sterile dressing. IMPRESSION: Successful placement of a left arm midline PICC with sonographic and fluoroscopic guidance. The catheter is ready for use. Read by: Ascencion Dike PA-C Electronically Signed   By: Sandi Mariscal M.D.   On: 09/10/2015 16:53   [place] Mfm Fetal Bpp Wo Non Stress  09/22/2015  OBSTETRICAL ULTRASOUND: This exam was performed within a [facility]. The OB US report was generated in the AS system, and faxed to the ordering physician.  This report is available in the [facility]. See the AS Obstetric US report via the Image  Link.  [place] Mfm Ob Follow Up  09/22/2015  OBSTETRICAL ULTRASOUND: This exam was performed within a [facility] Department. The OB US report was generated in the AS system, and faxed to the ordering physician.  This report is available in the [facility]. See the AS Obstetric US report via the Image Link.    Assessment and Plan: Patient Active Problem List   Diagnosis Date Noted  . Nausea and vomiting 09/07/2015  . Dehydration 09/07/2015  . Third trimester pregnancy 09/01/2015  . High risk for intrapartum complications, antepartum 08/12/2015  . Sickle cell anemia ([place]) 08/10/2015  . Chronic pain 08/04/2015  . Cocaine use 07/24/2015  . 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 Sickle cell anemia with pain ([place]) 07/24/2015  . Maternal sickle cell anemia affecting pregnancy, antepartum ([place]) 07/14/2015  . Sickle cell pain crisis ([place]) 07/12/2015  . Second trimester pregnancy 07/07/2015  . 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 Anemia of chronic disease   . Leukocytosis   . Mood swings ([place]) 11/08/2011  . Migraines 11/08/2011  . GERD (gastroesophageal reflux disease) 02/17/2011  . Depression 01/06/2011  . Sickle cell disease ([place]) 01/08/2009  . 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 Liston Alba (Sickle Cell Specialist). She will see her in the morning. Dilaudid 2 mg IV q2h prn breakthrough pain Ordered home medications: standing Oxycodone IR and Methadone Benadryl as needed for itching Daily NST, tocometer as needed Continue close observation   Verita Schneiders, MD, FACOG Attending [place], [facility]

## 2015-09-23 DIAGNOSIS — O0990 Supervision of high risk pregnancy, unspecified, unspecified trimester: Secondary | ICD-10-CM

## 2015-09-23 DIAGNOSIS — M549 Dorsalgia, unspecified: Secondary | ICD-10-CM | POA: Diagnosis not present

## 2015-09-23 DIAGNOSIS — Z3A32 32 weeks gestation of pregnancy: Secondary | ICD-10-CM | POA: Diagnosis not present

## 2015-09-23 DIAGNOSIS — O99013 Anemia complicating pregnancy, third trimester: Secondary | ICD-10-CM | POA: Diagnosis not present

## 2015-09-23 DIAGNOSIS — A6000 Herpesviral infection of urogenital system, unspecified: Secondary | ICD-10-CM | POA: Diagnosis not present

## 2015-09-23 DIAGNOSIS — D5700 Hb-SS disease with crisis, unspecified: Secondary | ICD-10-CM | POA: Diagnosis not present

## 2015-09-23 DIAGNOSIS — D638 Anemia in other chronic diseases classified elsewhere: Secondary | ICD-10-CM | POA: Diagnosis not present

## 2015-09-23 DIAGNOSIS — O98313 Other infections with a predominantly sexual mode of transmission complicating pregnancy, third trimester: Secondary | ICD-10-CM | POA: Diagnosis not present

## 2015-09-23 LAB — COMPREHENSIVE METABOLIC PANEL
ALT: 115 U/L — ABNORMAL HIGH (ref 14–54)
ANION GAP: 7 (ref 5–15)
AST: 64 U/L — ABNORMAL HIGH (ref 15–41)
Albumin: 2.8 g/dL — ABNORMAL LOW (ref 3.5–5.0)
Alkaline Phosphatase: 100 U/L (ref 38–126)
BUN: <5 mg/dL — ABNORMAL LOW (ref 6–20)
CALCIUM: 8.7 mg/dL — AB (ref 8.9–10.3)
CHLORIDE: 108 mmol/L (ref 101–111)
CO2: 22 mmol/L (ref 22–32)
Creatinine, Ser: 0.37 mg/dL — ABNORMAL LOW (ref 0.44–1.00)
GFR calc non Af Amer: >60 mL/min (ref >60)
Glucose, Bld: 97 mg/dL (ref 65–99)
POTASSIUM: 3.8 mmol/L (ref 3.5–5.1)
SODIUM: 137 mmol/L (ref 135–145)
Total Bilirubin: 3.1 mg/dL — ABNORMAL HIGH (ref 0.3–1.2)
Total Protein: 6.3 g/dL — ABNORMAL LOW (ref 6.5–8.1)

## 2015-09-23 LAB — LACTATE DEHYDROGENASE: LDH: 200 U/L — AB (ref 98–192)

## 2015-09-23 MED ORDER — ONDANSETRON HCL 4 MG/2ML IJ SOLN: 4.0000 mg | Freq: Four times a day (QID) | INTRAMUSCULAR | Status: DC | PRN

## 2015-09-23 MED ORDER — SODIUM CHLORIDE 0.9% FLUSH
9.0000 mL | INTRAVENOUS | Status: DC | PRN
Start: 2015-09-23 — End: 2015-09-27

## 2015-09-23 MED ORDER — DIPHENHYDRAMINE HCL 25 MG PO CAPS
25.0000 mg | ORAL_CAPSULE | Freq: Four times a day (QID) | ORAL | Status: DC | PRN
  Administered 2015-09-24 – 2015-09-26 (×6): 50 mg via ORAL
  Filled 2015-09-23 (×6): qty 2

## 2015-09-23 MED ORDER — HYDROMORPHONE 1 MG/ML IV SOLN
INTRAVENOUS | Status: DC
  Administered 2015-09-23: 15:00:00 via INTRAVENOUS
  Administered 2015-09-23: 4.8 mg via INTRAVENOUS
  Administered 2015-09-24: 13:00:00 via INTRAVENOUS
  Administered 2015-09-24: 4.2 mg via INTRAVENOUS
  Administered 2015-09-24: 12.4 mg via INTRAVENOUS
  Administered 2015-09-24: 2.4 mg via INTRAVENOUS
  Administered 2015-09-24: 4.8 mg via INTRAVENOUS
  Administered 2015-09-24: 3.6 mg via INTRAVENOUS
  Administered 2015-09-25: 4.19 mg via INTRAVENOUS
  Administered 2015-09-25: 6 mg via INTRAVENOUS
  Administered 2015-09-25: 0.6 mg via INTRAVENOUS
  Administered 2015-09-25: 4.5 mg via INTRAVENOUS
  Administered 2015-09-25: 6.6 mg via INTRAVENOUS
  Administered 2015-09-25: 5.4 mg via INTRAVENOUS
  Administered 2015-09-25: 07:00:00 via INTRAVENOUS
  Administered 2015-09-26: 2.4 mg via INTRAVENOUS
  Administered 2015-09-26: 4.8 mg via INTRAVENOUS
  Administered 2015-09-26: 08:00:00 via INTRAVENOUS
  Administered 2015-09-26: 5.4 mg via INTRAVENOUS
  Administered 2015-09-26: 7.8 mg via INTRAVENOUS
  Administered 2015-09-26: 6 mg via INTRAVENOUS
  Administered 2015-09-26: 0.6 mg via INTRAVENOUS
  Administered 2015-09-26: 23:00:00 via INTRAVENOUS
  Administered 2015-09-27: 6 mg via INTRAVENOUS
  Administered 2015-09-27 (×3): 4.8 mg via INTRAVENOUS
  Filled 2015-09-23 (×5): qty 25

## 2015-09-23 MED ORDER — DIPHENHYDRAMINE HCL 50 MG/ML IJ SOLN: 12.5000 mg | Freq: Four times a day (QID) | INTRAMUSCULAR | Status: DC | PRN

## 2015-09-23 MED ORDER — HYDROMORPHONE HCL 2 MG/ML IJ SOLN
2.0000 mg | INTRAMUSCULAR | Status: DC | PRN
  Administered 2015-09-24 – 2015-09-27 (×32): 2 mg via INTRAVENOUS
  Filled 2015-09-23 (×30): qty 1

## 2015-09-23 MED ORDER — POLYETHYLENE GLYCOL 3350 17 G PO PACK
17.0000 g | PACK | Freq: Every day | ORAL | Status: DC
  Administered 2015-09-24 – 2015-09-27 (×3): 17 g via ORAL
  Filled 2015-09-23 (×4): qty 1

## 2015-09-23 MED ORDER — NALOXONE HCL 0.4 MG/ML IJ SOLN: 0.4000 mg | INTRAMUSCULAR | Status: DC | PRN

## 2015-09-23 MED ORDER — SODIUM CHLORIDE 0.9 % IV SOLN
25.0000 mg | INTRAVENOUS | Status: DC | PRN
  Administered 2015-09-23 – 2015-09-24 (×2): 25 mg via INTRAVENOUS
  Filled 2015-09-23 (×5): qty 0.5

## 2015-09-23 MED ORDER — HYDROMORPHONE HCL 2 MG/ML IJ SOLN
2.0000 mg | INTRAMUSCULAR | Status: AC
  Administered 2015-09-23 (×4): 2 mg via INTRAVENOUS
  Filled 2015-09-23 (×5): qty 1

## 2015-09-23 MED ORDER — SENNOSIDES-DOCUSATE SODIUM 8.6-50 MG PO TABS
1.0000 | ORAL_TABLET | Freq: Two times a day (BID) | ORAL | Status: DC
  Administered 2015-09-23 – 2015-09-27 (×8): 1 via ORAL
  Filled 2015-09-23 (×8): qty 1

## 2015-09-23 MED ORDER — DIPHENHYDRAMINE HCL 12.5 MG/5ML PO ELIX: 12.5000 mg | ORAL_SOLUTION | Freq: Four times a day (QID) | ORAL | Status: DC | PRN

## 2015-09-23 NOTE — Progress Notes (Signed)
Patient ID: [name], female   DOB: Jan 22, 1991, 25 y.o.   MRN: [id number] See discharge summary

## 2015-09-23 NOTE — Progress Notes (Signed)
[facility] at bedside to transport to [place].  Report called to receiving nurse. Pt stable with SL.

## 2015-09-23 NOTE — Progress Notes (Signed)
[facility] COMPREHENSIVE PROGRESS NOTE  [name] is a 24 y.o. G3P1011 at [redacted]w[redacted]d who is admitted for sickle cell crisis.  Estimated Date of Delivery: 11/16/15 Fetal presentation is unsure.  Length of Stay:  1 Days. Admitted 09/22/2015  Subjective: Still reports 8/10 pain in left shoulder Patient reports good fetal movement.  She reports no uterine contractions, no bleeding and no loss of fluid per vagina.  Vitals:  Blood pressure 102/76, pulse 96, temperature 98.4 F (36.9 C), temperature source Oral, resp. rate 16, height 5\' 1"  (1.549 m), weight 173 lb (78.472 kg), last menstrual period 02/09/2015, SpO2 94 %, not currently breastfeeding. Physical Examination: CONSTITUTIONAL: Well-developed, well-nourished female in no acute distress.  HENT:  Normocephalic, atraumatic, External right and left ear normal. Oropharynx is clear and moist EYES: Conjunctivae and EOM are normal. Pupils are equal, round, and reactive to light. No scleral icterus.  NECK: Normal range of motion, supple, no masses SKIN: Skin is warm and dry. No rash noted. Not diaphoretic. No erythema. No pallor. [place]: Alert and oriented to person, place, and time. Normal reflexes, muscle tone coordination. No cranial nerve deficit noted. PSYCHIATRIC: Normal mood and affect. Normal behavior. Normal judgment and thought content. CARDIOVASCULAR: Normal heart rate noted, regular rhythm RESPIRATORY: Effort and breath sounds normal, no problems with respiration noted MUSCULOSKELETAL: Normal range of motion. No edema and no tenderness. 2+ distal pulses. ABDOMEN: Soft, nontender, nondistended, gravid. CERVIX:  Deferred  Fetal monitoring: FHR: 140 bpm, Variability: moderate, Accelerations: Present, Decelerations: Absent  Uterine activity: No contractions  Results for orders placed or performed during the hospital encounter of 09/22/15 (from the past 48 hour(s))  CBC     Status: Abnormal   Collection Time:  09/22/15  8:03 PM  Result Value Ref Range   WBC 15.1 (H) 4.0 - 10.5 K/uL   RBC 2.64 (L) 3.87 - 5.11 MIL/uL   Hemoglobin 7.9 (L) 12.0 - 15.0 g/dL   HCT 22.8 (L) 36.0 - 46.0 %   MCV 86.4 78.0 - 100.0 fL   MCH 29.9 26.0 - 34.0 pg   MCHC 34.6 30.0 - 36.0 g/dL   RDW 21.0 (H) 11.5 - 15.5 %   Platelets 590 (H) 150 - 400 K/uL  Type and screen [place]     Status: None   Collection Time: 09/22/15  8:03 PM  Result Value Ref Range   ABO/RH(D) B POS    Antibody Screen NEG    Sample Expiration 09/25/2015     [place] Mfm Fetal Bpp Wo Non Stress  09/22/2015  OBSTETRICAL ULTRASOUND: This exam was performed within a [facility]trasound Department. The OB US report was generated in the AS system, and faxed to the ordering physician.  This report is available in the [facility]. See the AS Obstetric US report via the Image Link.  [place] Mfm Ob Follow Up  09/22/2015  OBSTETRICAL ULTRASOUND: This exam was performed within a [facility]tras[facility] Department. The OB US report was generated in the AS system, and faxed to the ordering physician.  This report is available in the [facility]. See the AS Obstetric US report via the Image Link.   Current scheduled medications . diphenhydrAMINE      . docusate sodium  100 mg Oral Daily  . enoxaparin (LOVENOX) injection  40 mg Subcutaneous Q24H  . folic acid  1 mg Oral Daily  . HYDROmorphone   Intravenous Q4H  . methadone  5 mg Oral QHS  .  prenatal vitamin w/FE, FA  1 tablet Oral Q1200    I have reviewed the patient's current medications.  ASSESSMENT: Principal Problem:   Hb-SS disease with crisis ([place]) Active Problems:   Maternal sickle cell anemia affecting pregnancy, antepartum ([name])   PLAN: Continue Dilaudid PCA and Dilaudid 2 mg IV q2h prn breakthrough pains.  Oxycodone IR prn  and Methadone. Dr. Liston Alba (Sickle Cell Specialist) will see today and help with further pain management; appreciate her input. Benadryl as  needed for itching Reassuring FHT, no signs/symptoms of PTL or other maternal-fetal distress.  Continue daily NST, tocometer as needed Continue routine antenatal care.   Verita Schneiders, MD, [place] Attending [place], [facility]

## 2015-09-23 NOTE — Telephone Encounter (Signed)
Refill request for oxycodone 15mg . LOV 09/22/2015. Please advise. Thanks!

## 2015-09-23 NOTE — Discharge Summary (Signed)
OB Discharge Summary     Patient Name: [name] DOB: [birth year] MRN: [id number]  Date of admission: 09/22/2015 Delivering MD: This patient has no babies on file.  Date of discharge: 09/23/2015  Admitting diagnosis: Sickle cell crisis with left shoulder pain Intrauterine pregnancy: [redacted]w[redacted]d     Secondary diagnosis:  Principal Problem:   Hb-SS disease with crisis ([place]) Active Problems:   Maternal sickle cell anemia affecting pregnancy, antepartum ([place])  Additional problems:      Discharge diagnosis: Preterm pregnancy with active sickle cell crisis being transferred to the sickle cell team at [facility] course:  Patient admitted at 32 weeks 1 day for the management of left shoulder pain related to her sickle cell crisis. She was recently discharged from the hospital on 09/14/2015. Patient is currently without obstetrical concerns. She reports good fetal movement. She denies leakage of fluid, vaginal bleeding or regular contractions. Fetal monitoring revealed a category I tracing. The patient's pain has been managed with dilaudid pca, with IV dilaudid prn and oxycodone XR. Patient will be transferred to [facility] Sickle cell team for further management of her pain. Ob Rapid response will round daily on this patient. Chart review will also be performed daily   Physical exam  Filed Vitals:   09/23/15 0000 09/23/15 0100 09/23/15 0200 09/23/15 0600  BP:  109/64  102/76  Pulse: 105 112 104 96  Temp:    98.4 F (36.9 C)  TempSrc:    Oral  Resp:   16 16  Height:      Weight:      SpO2: 95% 95% 95% 94%   See AM progress note  Labs: Lab Results  Component Value Date   WBC 15.1* 09/22/2015   HGB 7.9* 09/22/2015   HCT 22.8* 09/22/2015   MCV 86.4 09/22/2015   PLT 590* 09/22/2015   CMP Latest Ref Rng 09/23/2015  Glucose 65 - 99 mg/dL 97  BUN 6 - 20 mg/dL PENDING  Creatinine  0.44 - 1.00 mg/dL 0.37(L)  Sodium 135 - 145 mmol/L 137  Potassium 3.5 - 5.1 mmol/L 3.8  Chloride 101 - 111 mmol/L 108  CO2 22 - 32 mmol/L 22  Calcium 8.9 - 10.3 mg/dL 8.7(L)  Total Protein 6.5 - 8.1 g/dL 6.3(L)  Total Bilirubin 0.3 - 1.2 mg/dL 3.1(H)  Alkaline Phos 38 - 126 U/L 100  AST 15 - 41 U/L 64(H)  ALT 14 - 54 U/L 115(H)    Discharge instruction: per After Visit Summary and "Baby and Me Booklet".  After visit meds:    Medication List    ASK your doctor about these medications        calcium carbonate 500 MG chewable tablet  Commonly known as:  TUMS - dosed in mg elemental calcium  Chew 2 tablets by mouth 2 (two) times daily as needed for indigestion or heartburn.     folic acid 1 MG tablet  Commonly known as:  FOLVITE  Take 1 mg by mouth daily.     methadone 5 MG tablet  Commonly known as:  DOLOPHINE  Take 1 tablet (5 mg total) by mouth at bedtime.     oxyCODONE 15 MG immediate release tablet  Commonly known as:  ROXICODONE  Take 1 tablet (15 mg total) by mouth every 4 (four) hours as needed for pain.     prenatal multivitamin Tabs tablet  Take 1 tablet by mouth daily at 12 noon.        Diet: routine diet  Activity: Advance as tolerated. Pelvic rest for 6 weeks.   Outpatient follow up: will be scheduled as the patient is nearing discharge from the hospital Follow up Appt:Future Appointments Date Time Provider [place]  09/24/2015 9:30 AM Truett Mainland, DO [place]  09/29/2015 11:00 AM WH-MFC [place] 4 WH-MFCUS MFC-US  10/06/2015 11:00 AM WH-MFC [place] 3 WH-MFCUS MFC-US  10/13/2015 11:15 AM WH-MFC [place] 4 WH-MFCUS MFC-US  10/20/2015 10:15 AM WH-MFC [place] 4 WH-MFCUS MFC-US  10/27/2015 10:15 AM WH-MFC [place] 4 WH-MFCUS MFC-US  11/04/2015 10:30 AM Dorena Dew, FNP [facility] None   Follow up Visit:No Follow-up on file.    09/23/2015 Jensen Cheramie, MD

## 2015-09-23 NOTE — Progress Notes (Signed)
SICKLE CELL SERVICE PROGRESS NOTE  MAURIE JOENS [id number] DOB: 30-Nov-1990 DOA: 09/22/2015 PCP: Angelica Chessman, MD  Assessment/Plan: Principal Problem:   Hb-SS disease with crisis ([place]) Active Problems:   Maternal sickle cell anemia affecting pregnancy, antepartum ([place])  1. Hb SS with crisis: This patient has had a very difficult pregnancy with multiple admissions. I feel that her inability to use NSAIDs as contributing considerably to her clinical course. Additionally she still wouldn't continues to have a high hemoglobin as which is keenly contributory to the clinical course. Patient is currently on a weight-based PCA. I will continue the PCA and add scheduled intermittent clinician assisted doses of Dilaudid. She is unable to receive NSAIDs due to her pregnancy and will continue to hydrate her with hypotonic fluid. 2. Leukocytosis: No evidence of infection. The leukocytosis is likely associated with the inflammatory state associated with her crisis. 3. Anemia of chronic disease: The patient has a hemoglobin of 7.9 which is essentially at the target hemoglobin of 8 recommended in a pregnant patient with sickle cell. I expect that with hydration she'll have some dilutional effect and may need a transfusion while she is hospitalized. 4. Chronic pain: Continue methadone 5 mg daily at bedtime. 5. Third trimester pregnancy: Patient alert trimester of her pregnancy. Approximately [redacted] weeks gestation age. She's been evaluated by obstetrics and DOB rapid response team will follow her daily over here.   Code Status: Full Code Family Communication: N/A Disposition Plan: Not yet ready for discharge  [place].  Pager [phone]. If 7PM-7AM, please contact night-coverage.  09/23/2015, 3:51 PM  LOS: 1 day   Interim History: This is an opiate tolerant patient with hemoglobin SS with frequent hospitalizations who presented to the [facility] emergency department last night with  complaints of acute sickle cell crisis while having what she thought was contractions. She was evaluated by the obstetrician's and found to be within normal limits with regard to her pregnancy. However she was admitted to [facility] hospital for management of sickle cell crisis. I received a call from the attending physician last night asking for guidance on dosing her pain medications. Based on the information presented to be a recommended the patient should be on a weight-based Dilaudid PCA with scheduled intermittent doses. However according to Dr. Elly Modena with my spoke this morning she said the patient was unable to receive weight-based dosing was its that based on a full dose Dilaudid PCA. The PCA was redosed based upon her weight and the patient was transferred over to [facility] for management of her sickle cell crisis. According to the patient she's been having pain in the shoulder region however the pain escalated in the past 24 hours to the point where she was able to manage him with oral analgesics. Denies any fevers, chills, vomiting, diarrhea or focal neurological deficits. Currently she rates her pain as 8/10 and localized to the left shoulder, back and abdomen.  Consultants:  Obstetrics rapid response team  Procedures:  None   Antibiotics:  None    Objective: Filed Vitals:   09/23/15 0600 09/23/15 1000 09/23/15 1349 09/23/15 1433  BP: 102/76     Pulse: 96     Temp: 98.4 F (36.9 C)     TempSrc: Oral     Resp: 16 16 16 15   Height:      Weight:      SpO2: 94% 97% 97% 97%   Weight change:  No intake or output data in the 24 hours ending  09/23/15 1551     General: Alert, awake, oriented x3, in no acute distress.  HEENT: [place]/AT PEERL, EOMI, anicteric Neck: Trachea midline,  no masses, no thyromegal,y no JVD, no carotid bruit OROPHARYNX:  Moist, No exudate/ erythema/lesions.  Heart: Regular rate and rhythm, without murmurs, rubs, gallops, PMI non-displaced, no  heaves or thrills on palpation.  Lungs: Clear to auscultation, no wheezing or rhonchi noted. No increased vocal fremitus resonant to percussion  Abdomen: Soft, nontender, nondistended, positive bowel sounds, no masses no hepatosplenomegaly noted.  Neuro: No focal neurological deficits noted cranial nerves II through XII grossly intact. Strength  in bilateral upper and lower extremities. Musculoskeletal: No warm swelling or erythema around joints, no spinal tenderness noted. Psychiatric: Patient alert and oriented x3, good insight and cognition, good recent to remote recall.    Data Reviewed: Basic Metabolic Panel:  Recent Labs Lab 09/23/15 0952  NA 137  K 3.8  CL 108  CO2 22  GLUCOSE 97  BUN <5*  CREATININE 0.37*  CALCIUM 8.7*   Liver Function Tests:  Recent Labs Lab 09/23/15 0952  AST 64*  ALT 115*  ALKPHOS 100  BILITOT 3.1*  PROT 6.3*  ALBUMIN 2.8*   No results for input(s): LIPASE, AMYLASE in the last 168 hours. No results for input(s): AMMONIA in the last 168 hours. CBC:  Recent Labs Lab 09/22/15 2003  WBC 15.1*  HGB 7.9*  HCT 22.8*  MCV 86.4  PLT 590*   Cardiac Enzymes: No results for input(s): CKTOTAL, CKMB, CKMBINDEX, TROPONINI in the last 168 hours. BNP (last 3 results) No results for input(s): BNP in the last 8760 hours.  ProBNP (last 3 results) No results for input(s): PROBNP in the last 8760 hours.  CBG: No results for input(s): GLUCAP in the last 168 hours.  No results found for this or any previous visit (from the past 240 hour(s)).   Studies: Dg Chest 2 View  09/07/2015  CLINICAL DATA:  Abdominal pain starting 5:30 this morning, history of sickle cell disease, [redacted] weeks pregnant EXAM: CHEST  2 VIEW COMPARISON:  08/11/2015 FINDINGS: Cardiomediastinal silhouette is stable. No acute infiltrate or pleural effusion. No pulmonary edema. Stable pleural parenchymal scarring in right upper lobe. Stable small calcified granulomas. Stable chronic  sclerotic changes bilateral humeral heads consistent with sickle cell sequela. IMPRESSION: No acute infiltrate or pleural effusion. No pulmonary edema. Stable pleural parenchymal scarring in right upper lobe. Stable small calcified granulomas. Electronically Signed   By: Lahoma Crocker M.D.   On: 09/07/2015 09:24   Ir Fluoro Guide Cv Line Left  09/10/2015  INDICATION: Poor venous access. Sickle cell anemia. Request PICC line placement. EXAM: LEFT UPPER EXTREMITY MIDLINE PICC LINE PLACEMENT WITH ULTRASOUND AND FLUOROSCOPIC GUIDANCE MEDICATIONS: None; The antibiotic was administered within an appropriate time interval prior to skin puncture. ANESTHESIA/SEDATION: Moderate Sedation Time:  None The patient was continuously monitored during the procedure by the interventional radiology nurse under my direct supervision. FLUOROSCOPY TIME:  Fluoroscopy Time: 9 minutes 36 seconds COMPLICATIONS: None immediate. PROCEDURE: The patient was advised of the possible risks and complications and agreed to undergo the procedure. The patient was then brought to the angiographic suite for the procedure. The left arm was prepped with chlorhexidine, draped in the usual sterile fashion using maximum barrier technique (cap and mask, sterile gown, sterile gloves, large sterile sheet, hand hygiene and cutaneous antiseptic). Local anesthesia was attained by infiltration with 1% lidocaine. Ultrasound demonstrated patency of the brachial vein, and this was documented with  an image. Under real-time ultrasound guidance, this vein was accessed with a 21 gauge micropuncture needle and image documentation was performed. The needle was exchanged over a guidewire for a peel-away sheath, however the catheter could not be advanced into the SVC despite multiple attempts. Ultimately, the decision was made to leave the catheter as a midline catheter and a 24 cm 5 French dual lumen PICC was advanced, and positioned with its tip at the mid left subclavian.  Fluoroscopy during the procedure and fluoro spot radiograph confirms appropriate catheter position. The catheter was flushed, secured to the skin, and covered with a sterile dressing. IMPRESSION: Successful placement of a left arm midline PICC with sonographic and fluoroscopic guidance. The catheter is ready for use. Read by: Ascencion Dike PA-C Electronically Signed   By: Sandi Mariscal M.D.   On: 09/10/2015 16:53   Ir US Guide Vasc Access Left  09/10/2015  INDICATION: Poor venous access. Sickle cell anemia. Request PICC line placement. EXAM: LEFT UPPER EXTREMITY MIDLINE PICC LINE PLACEMENT WITH ULTRASOUND AND FLUOROSCOPIC GUIDANCE MEDICATIONS: None; The antibiotic was administered within an appropriate time interval prior to skin puncture. ANESTHESIA/SEDATION: Moderate Sedation Time:  None The patient was continuously monitored during the procedure by the interventional radiology nurse under my direct supervision. FLUOROSCOPY TIME:  Fluoroscopy Time: 9 minutes 36 seconds COMPLICATIONS: None immediate. PROCEDURE: The patient was advised of the possible risks and complications and agreed to undergo the procedure. The patient was then brought to the angiographic suite for the procedure. The left arm was prepped with chlorhexidine, draped in the usual sterile fashion using maximum barrier technique (cap and mask, sterile gown, sterile gloves, large sterile sheet, hand hygiene and cutaneous antiseptic). Local anesthesia was attained by infiltration with 1% lidocaine. Ultrasound demonstrated patency of the brachial vein, and this was documented with an image. Under real-time ultrasound guidance, this vein was accessed with a 21 gauge micropuncture needle and image documentation was performed. The needle was exchanged over a guidewire for a peel-away sheath, however the catheter could not be advanced into the SVC despite multiple attempts. Ultimately, the decision was made to leave the catheter as a midline catheter and a  24 cm 5 French dual lumen PICC was advanced, and positioned with its tip at the mid left subclavian. Fluoroscopy during the procedure and fluoro spot radiograph confirms appropriate catheter position. The catheter was flushed, secured to the skin, and covered with a sterile dressing. IMPRESSION: Successful placement of a left arm midline PICC with sonographic and fluoroscopic guidance. The catheter is ready for use. Read by: Ascencion Dike PA-C Electronically Signed   By: Sandi Mariscal M.D.   On: 09/10/2015 16:53   [place] Mfm Fetal Bpp Wo Non Stress  09/22/2015  OBSTETRICAL ULTRASOUND: This exam was performed within a [place] Ultrasound Department. The OB US report was generated in the AS system, and faxed to the ordering physician.  This report is available in the [facility]. See the AS Obstetric US report via the Image Link.  [place] Mfm Ob Follow Up  09/22/2015  OBSTETRICAL ULTRASOUND: This exam was performed within a [place] Ultrasound Department. The OB US report was generated in the AS system, and faxed to the ordering physician.  This report is available in the [facility]. See the AS Obstetric US report via the Image Link.   Scheduled Meds: . enoxaparin (LOVENOX) injection  40 mg Subcutaneous Q24H  . folic acid  1 mg Oral Daily  . HYDROmorphone   Intravenous  Q4H  .  HYDROmorphone (DILAUDID) injection  2 mg Intravenous Q2H  . methadone  5 mg Oral QHS  . [START ON 09/24/2015] polyethylene glycol  17 g Oral Daily  . prenatal vitamin w/FE, FA  1 tablet Oral Q1200  . senna-docusate  1 tablet Oral BID   Continuous Infusions: . dextrose 5 % and 0.45% NaCl 100 mL/hr at 09/23/15 1432    Time spent 30 minutes.

## 2015-09-24 ENCOUNTER — Encounter: Payer: Medicare Other | Admitting: Family Medicine

## 2015-09-24 DIAGNOSIS — O98313 Other infections with a predominantly sexual mode of transmission complicating pregnancy, third trimester: Secondary | ICD-10-CM | POA: Diagnosis not present

## 2015-09-24 DIAGNOSIS — O99013 Anemia complicating pregnancy, third trimester: Secondary | ICD-10-CM | POA: Diagnosis not present

## 2015-09-24 DIAGNOSIS — Z3A32 32 weeks gestation of pregnancy: Secondary | ICD-10-CM | POA: Diagnosis not present

## 2015-09-24 DIAGNOSIS — D571 Sickle-cell disease without crisis: Secondary | ICD-10-CM | POA: Diagnosis not present

## 2015-09-24 DIAGNOSIS — D638 Anemia in other chronic diseases classified elsewhere: Secondary | ICD-10-CM

## 2015-09-24 DIAGNOSIS — O99019 Anemia complicating pregnancy, unspecified trimester: Secondary | ICD-10-CM | POA: Diagnosis not present

## 2015-09-24 DIAGNOSIS — M549 Dorsalgia, unspecified: Secondary | ICD-10-CM | POA: Diagnosis not present

## 2015-09-24 DIAGNOSIS — A6000 Herpesviral infection of urogenital system, unspecified: Secondary | ICD-10-CM | POA: Diagnosis not present

## 2015-09-24 DIAGNOSIS — D5700 Hb-SS disease with crisis, unspecified: Secondary | ICD-10-CM | POA: Diagnosis not present

## 2015-09-24 LAB — HEMOGLOBINOPATHY EVALUATION
Hemoglobin Other: 0 %
Hgb A2 Quant: 3 % (ref 2.2–3.2)
Hgb A: 18 % — ABNORMAL LOW (ref 96.8–97.8)
Hgb F Quant: 8.6 % — ABNORMAL HIGH (ref 0.0–2.0)
Hgb S Quant: 70.4 % — ABNORMAL HIGH

## 2015-09-24 NOTE — Progress Notes (Signed)
SICKLE CELL SERVICE PROGRESS NOTE  [name] [id number] DOB: 1991/02/20 DOA: 09/22/2015 PCP: Angelica Chessman, MD  Assessment/Plan: Principal Problem:   Hb-SS disease with crisis ([place]) Active Problems:   Maternal sickle cell anemia affecting pregnancy, antepartum ([place])  1. Hb SS with crisis: Continue Dilaudid PCA at current dose and and continue clinician assisted doses on a when necessary basis. She is eating and drinking well so I will decrease her IV fluids also. She is unable to receive NSAIDs due to her pregnancy and will continue to hydrate her with hypotonic fluid. Results of hemoglobin electrophoresis still not available. We'll follow-up tomorrow 2. Leukocytosis: No evidence of infection. The leukocytosis is likely associated with the inflammatory state associated with her crisis. 3. Anemia of chronic disease: The patient has a hemoglobin of 7.9 which is essentially at the target hemoglobin of 8 recommended in a pregnant patient with sickle cell. I expect that with hydration she'll have some dilutional effect and may need a transfusion while she is hospitalized. 4. Chronic pain: Continue methadone 5 mg daily at bedtime. 5. Third trimester pregnancy: Patient alert trimester of her pregnancy. Approximately [redacted] weeks gestation age. She's been evaluated by obstetrics and DOB rapid response team will follow her daily over here.   Code Status: Full Code Family Communication: N/A Disposition Plan: Not yet ready for discharge  [place].  Pager [phone]. If 7PM-7AM, please contact night-coverage.  09/24/2015, 1:05 PM  LOS: 2 days   Interim History: Patient reports her pain is 8-9/10 and essentially localized to the left shoulder and left upper back. She denies any other complaints at this time. She has used 23.8 mg with 39/37: [place] deliveries on the PCA in the last 24 hours. Additionally she has used 18 mg of Dilaudid and clinician assisted doses. The patient is  unable to receive Toradol secondary to current pregnancy. I spoke with her primary care physician Dr. Doreene Burke and he asked that I follow up on her hemoglobin electrophoresis that was done a few days ago. The patient is currently pregnant and has required serial exchange transfusions to support her pregnancy to a goal of hemoglobin S percentage of 30% and a total hemoglobin of 8 g/dL or above.  Consultants:  Obstetrics rapid response team  Procedures:  None   Antibiotics:  None    Objective: Filed Vitals:   09/24/15 0400 09/24/15 0627 09/24/15 0815 09/24/15 0950  BP:  98/72  117/82  Pulse:  101  119  Temp:  98.5 F (36.9 C)  98.4 F (36.9 C)  TempSrc:  Oral  Oral  Resp: 11 15 13 17   Height:      Weight:  184 lb 1.4 oz (83.5 kg)    SpO2: 100% 95% 97% 95%   Weight change: 11 lb 1.4 oz (5.028 kg)  Intake/Output Summary (Last 24 hours) at 09/24/15 1305 Last data filed at 09/24/15 0933  Gross per 24 hour  Intake    340 ml  Output   1750 ml  Net  -1410 ml       General: Alert, awake, oriented x3, in mild distress secondary to pain.  HEENT: [place]/AT PEERL, EOMI, anicteric Neck: Trachea midline,  no masses, no thyromegal,y no JVD, no carotid bruit OROPHARYNX:  Moist, No exudate/ erythema/lesions.  Heart: Regular rate and rhythm, without murmurs, rubs, gallops, PMI non-displaced, no heaves or thrills on palpation.  Lungs: Clear to auscultation, no wheezing or rhonchi noted. No increased vocal fremitus resonant to percussion  Abdomen: Gravid  Neuro: No focal neurological deficits noted cranial nerves II through XII grossly intact. Strength  in bilateral upper and lower extremities. Musculoskeletal: No warm swelling or erythema around joints, no spinal tenderness noted. Psychiatric: Patient alert and oriented x3, good insight and cognition, good recent to remote recall.    Data Reviewed: Basic Metabolic Panel:  Recent Labs Lab 09/23/15 0952  NA 137  K 3.8  CL 108  CO2  22  GLUCOSE 97  BUN <5*  CREATININE 0.37*  CALCIUM 8.7*   Liver Function Tests:  Recent Labs Lab 09/23/15 0952  AST 64*  ALT 115*  ALKPHOS 100  BILITOT 3.1*  PROT 6.3*  ALBUMIN 2.8*   No results for input(s): LIPASE, AMYLASE in the last 168 hours. No results for input(s): AMMONIA in the last 168 hours. CBC:  Recent Labs Lab 09/22/15 2003  WBC 15.1*  HGB 7.9*  HCT 22.8*  MCV 86.4  PLT 590*   Cardiac Enzymes: No results for input(s): CKTOTAL, CKMB, CKMBINDEX, TROPONINI in the last 168 hours. BNP (last 3 results) No results for input(s): BNP in the last 8760 hours.  ProBNP (last 3 results) No results for input(s): PROBNP in the last 8760 hours.  CBG: No results for input(s): GLUCAP in the last 168 hours.  No results found for this or any previous visit (from the past 240 hour(s)).   Studies: Dg Chest 2 View  09/07/2015  CLINICAL DATA:  Abdominal pain starting 5:30 this morning, history of sickle cell disease, [redacted] weeks pregnant EXAM: CHEST  2 VIEW COMPARISON:  08/11/2015 FINDINGS: Cardiomediastinal silhouette is stable. No acute infiltrate or pleural effusion. No pulmonary edema. Stable pleural parenchymal scarring in right upper lobe. Stable small calcified granulomas. Stable chronic sclerotic changes bilateral humeral heads consistent with sickle cell sequela. IMPRESSION: No acute infiltrate or pleural effusion. No pulmonary edema. Stable pleural parenchymal scarring in right upper lobe. Stable small calcified granulomas. Electronically Signed   By: Lahoma Crocker M.D.   On: 09/07/2015 09:24   Ir Fluoro Guide Cv Line Left  09/10/2015  INDICATION: Poor venous access. Sickle cell anemia. Request PICC line placement. EXAM: LEFT UPPER EXTREMITY MIDLINE PICC LINE PLACEMENT WITH ULTRASOUND AND FLUOROSCOPIC GUIDANCE MEDICATIONS: None; The antibiotic was administered within an appropriate time interval prior to skin puncture. ANESTHESIA/SEDATION: Moderate Sedation Time:  None The  patient was continuously monitored during the procedure by the interventional radiology nurse under my direct supervision. FLUOROSCOPY TIME:  Fluoroscopy Time: 9 minutes 36 seconds COMPLICATIONS: None immediate. PROCEDURE: The patient was advised of the possible risks and complications and agreed to undergo the procedure. The patient was then brought to the angiographic suite for the procedure. The left arm was prepped with chlorhexidine, draped in the usual sterile fashion using maximum barrier technique (cap and mask, sterile gown, sterile gloves, large sterile sheet, hand hygiene and cutaneous antiseptic). Local anesthesia was attained by infiltration with 1% lidocaine. Ultrasound demonstrated patency of the brachial vein, and this was documented with an image. Under real-time ultrasound guidance, this vein was accessed with a 21 gauge micropuncture needle and image documentation was performed. The needle was exchanged over a guidewire for a peel-away sheath, however the catheter could not be advanced into the SVC despite multiple attempts. Ultimately, the decision was made to leave the catheter as a midline catheter and a 24 cm 5 French dual lumen PICC was advanced, and positioned with its tip at the mid left subclavian. Fluoroscopy during the procedure and fluoro spot radiograph confirms appropriate  catheter position. The catheter was flushed, secured to the skin, and covered with a sterile dressing. IMPRESSION: Successful placement of a left arm midline PICC with sonographic and fluoroscopic guidance. The catheter is ready for use. Read by: Ascencion Dike PA-C Electronically Signed   By: Sandi Mariscal M.D.   On: 09/10/2015 16:53   Ir US Guide Vasc Access Left  09/10/2015  INDICATION: Poor venous access. Sickle cell anemia. Request PICC line placement. EXAM: LEFT UPPER EXTREMITY MIDLINE PICC LINE PLACEMENT WITH ULTRASOUND AND FLUOROSCOPIC GUIDANCE MEDICATIONS: None; The antibiotic was administered within an  appropriate time interval prior to skin puncture. ANESTHESIA/SEDATION: Moderate Sedation Time:  None The patient was continuously monitored during the procedure by the interventional radiology nurse under my direct supervision. FLUOROSCOPY TIME:  Fluoroscopy Time: 9 minutes 36 seconds COMPLICATIONS: None immediate. PROCEDURE: The patient was advised of the possible risks and complications and agreed to undergo the procedure. The patient was then brought to the angiographic suite for the procedure. The left arm was prepped with chlorhexidine, draped in the usual sterile fashion using maximum barrier technique (cap and mask, sterile gown, sterile gloves, large sterile sheet, hand hygiene and cutaneous antiseptic). Local anesthesia was attained by infiltration with 1% lidocaine. Ultrasound demonstrated patency of the brachial vein, and this was documented with an image. Under real-time ultrasound guidance, this vein was accessed with a 21 gauge micropuncture needle and image documentation was performed. The needle was exchanged over a guidewire for a peel-away sheath, however the catheter could not be advanced into the SVC despite multiple attempts. Ultimately, the decision was made to leave the catheter as a midline catheter and a 24 cm 5 French dual lumen PICC was advanced, and positioned with its tip at the mid left subclavian. Fluoroscopy during the procedure and fluoro spot radiograph confirms appropriate catheter position. The catheter was flushed, secured to the skin, and covered with a sterile dressing. IMPRESSION: Successful placement of a left arm midline PICC with sonographic and fluoroscopic guidance. The catheter is ready for use. Read by: Ascencion Dike PA-C Electronically Signed   By: Sandi Mariscal M.D.   On: 09/10/2015 16:53   [place] Mfm Fetal Bpp Wo Non Stress  09/22/2015  OBSTETRICAL ULTRASOUND: This exam was performed within a [place] Ultrasound Department. The OB US report was generated in the AS  system, and faxed to the ordering physician.  This report is available in the [facility]. See the AS Obstetric US report via the Image Link.  [place] Mfm Ob Follow Up  09/22/2015  OBSTETRICAL ULTRASOUND: This exam was performed within a [place] Ultrasound Department. The OB US report was generated in the AS system, and faxed to the ordering physician.  This report is available in the [facility]. See the AS Obstetric US report via the Image Link.   Scheduled Meds: . enoxaparin (LOVENOX) injection  40 mg Subcutaneous Q24H  . folic acid  1 mg Oral Daily  . HYDROmorphone   Intravenous Q4H  . methadone  5 mg Oral QHS  . polyethylene glycol  17 g Oral Daily  . prenatal vitamin w/FE, FA  1 tablet Oral Q1200  . senna-docusate  1 tablet Oral BID   Continuous Infusions: . dextrose 5 % and 0.45% NaCl 1,000 mL (09/23/15 2320)    Time spent 25 minutes.

## 2015-09-24 NOTE — Progress Notes (Signed)
NST reassuring, DR constant called informed of NST. Pt feeling baby move, audible movement heard while doing NST. Pt cooperative.

## 2015-09-24 NOTE — Care Management Note (Signed)
Case Management Note  Patient Details  Name: [name] MRN: [id number] Date of Birth: Jul 12, 1990  Subjective/Objective:       25 yo admitted with [facility]             Action/Plan: From home with children and plans to dc there.  Expected Discharge Date:                  Expected Discharge Plan:  Home/Self Care  In-House Referral:     Discharge planning Services  CM Consult  Post Acute Care Choice:    Choice offered to:     DME Arranged:    DME Agency:     HH Arranged:    HH Agency:     Status of Service:  In process, will continue to follow  Medicare Important Message Given:    Date Medicare IM Given:    Medicare IM give by:    Date Additional Medicare IM Given:    Additional Medicare Important Message give by:     If discussed at [place] of Stay Meetings, dates discussed:    Additional CommentsLynnell Catalan, RN 09/24/2015, 2:02 PM  [phone]

## 2015-09-24 NOTE — Progress Notes (Signed)
Pharmacy IV to PO conversion  The patient is receiving diphenhydramine by the intravenous route.  Based on the following criteria approved by the Pharmacy and [place], diphenhydramine is being converted to the equivalent oral dose form.   Not prescribed to treat or prevent a severe allergic reaction   Not prescribed as premedication prior to receiving blood product, biologic medication, antimicrobial, or chemotherapy agent   The patient has tolerated at least one dose of an oral or enteral medication   The patient has no evidence of active gastrointestinal bleeding or impaired GI absorption (gastrectomy, short bowel, patient on TNA or NPO).   The patient is not undergoing procedural sedation  If you have any questions about this conversion, please contact the Pharmacy Department (ext [phone]).  Thank you.  Reuel Boom, PharmD Pager: [phone] 09/24/2015, 2:20 PM

## 2015-09-24 NOTE — Progress Notes (Signed)
Audible fetal heart tone w/ doppler.wbb

## 2015-09-25 ENCOUNTER — Inpatient Hospital Stay (HOSPITAL_COMMUNITY): Payer: Medicare Other

## 2015-09-25 DIAGNOSIS — O99013 Anemia complicating pregnancy, third trimester: Secondary | ICD-10-CM | POA: Diagnosis not present

## 2015-09-25 DIAGNOSIS — A6000 Herpesviral infection of urogenital system, unspecified: Secondary | ICD-10-CM | POA: Diagnosis not present

## 2015-09-25 DIAGNOSIS — D5700 Hb-SS disease with crisis, unspecified: Secondary | ICD-10-CM | POA: Diagnosis not present

## 2015-09-25 DIAGNOSIS — D638 Anemia in other chronic diseases classified elsewhere: Secondary | ICD-10-CM | POA: Diagnosis not present

## 2015-09-25 DIAGNOSIS — Z452 Encounter for adjustment and management of vascular access device: Secondary | ICD-10-CM | POA: Diagnosis not present

## 2015-09-25 DIAGNOSIS — Z3A32 32 weeks gestation of pregnancy: Secondary | ICD-10-CM | POA: Diagnosis not present

## 2015-09-25 DIAGNOSIS — M549 Dorsalgia, unspecified: Secondary | ICD-10-CM | POA: Diagnosis not present

## 2015-09-25 DIAGNOSIS — O98313 Other infections with a predominantly sexual mode of transmission complicating pregnancy, third trimester: Secondary | ICD-10-CM | POA: Diagnosis not present

## 2015-09-25 LAB — CBC WITH DIFFERENTIAL/PLATELET
BASOS ABS: 0 10*3/uL (ref 0.0–0.1)
Basophils Relative: 0 %
EOS ABS: 0.4 10*3/uL (ref 0.0–0.7)
EOS PCT: 2 %
HCT: 21.6 % — ABNORMAL LOW (ref 36.0–46.0)
Hemoglobin: 7.4 g/dL — ABNORMAL LOW (ref 12.0–15.0)
LYMPHS ABS: 2.1 10*3/uL (ref 0.7–4.0)
Lymphocytes Relative: 13 %
MCH: 30.1 pg (ref 26.0–34.0)
MCHC: 34.3 g/dL (ref 30.0–36.0)
MCV: 87.8 fL (ref 78.0–100.0)
MONO ABS: 1 10*3/uL (ref 0.1–1.0)
MONOS PCT: 7 %
Neutro Abs: 12.2 10*3/uL — ABNORMAL HIGH (ref 1.7–7.7)
Neutrophils Relative %: 78 %
PLATELETS: 527 10*3/uL — AB (ref 150–400)
RBC: 2.46 MIL/uL — AB (ref 3.87–5.11)
RDW: 20.3 % — AB (ref 11.5–15.5)
WBC: 15.8 10*3/uL — ABNORMAL HIGH (ref 4.0–10.5)

## 2015-09-25 LAB — PREPARE RBC (CROSSMATCH)

## 2015-09-25 MED ORDER — SODIUM CHLORIDE 0.9 % IV BOLUS (SEPSIS)
500.0000 mL | Freq: Once | INTRAVENOUS | Status: AC
Start: 2015-09-25 — End: 2015-09-25
  Administered 2015-09-25: 500 mL via INTRAVENOUS

## 2015-09-25 MED ORDER — LIDOCAINE HCL 1 % IJ SOLN
INTRAMUSCULAR | Status: AC
  Filled 2015-09-25: qty 20

## 2015-09-25 MED ORDER — SODIUM CHLORIDE 0.9 % IV BOLUS (SEPSIS)
500.0000 mL | Freq: Once | INTRAVENOUS | Status: AC
  Administered 2015-09-25: 500 mL via INTRAVENOUS

## 2015-09-25 MED ORDER — SODIUM CHLORIDE 0.9 % IV BOLUS (SEPSIS)
500.0000 mL | Freq: Once | INTRAVENOUS | Status: DC
Start: 2015-09-25 — End: 2015-09-25

## 2015-09-25 MED ORDER — SODIUM CHLORIDE 0.9 % IV SOLN
Freq: Once | INTRAVENOUS | Status: AC
  Administered 2015-09-26: 01:00:00 via INTRAVENOUS

## 2015-09-25 NOTE — Progress Notes (Signed)
I called IR to check what time will be the PICC be placed and according to the nurse, IR physicians are busy and not sure what time the procedure will be performed.

## 2015-09-25 NOTE — Progress Notes (Signed)
IV nurse notified of exchange transfusion at around 1730.

## 2015-09-25 NOTE — Progress Notes (Signed)
1823  NST completed.  Patient denies vaginal bleeding, leaking of fluid, or abdominal pain.  Reports active fetal movement earlier in the day.  Occasional fetal movement palpated during NST.  FHR 135 with occasional 10 x 10, minimal variability, rare UC.  Dr. Elonda Husky notified of above.  Notified of patient questioning change in due date.  Aware of HSV diagnosis.  States suppressive treatment should be started at 34 wks.  No new orders received.

## 2015-09-25 NOTE — Care Management Important Message (Signed)
Important Message  Patient Details  Name: [name] MRN: [id number] Date of Birth: [birth year]   Medicare Important Message Given:  Yes    Camillo Flaming 09/25/2015, 10:14 AMImportant Message  Patient Details  Name: [name] MRN: [id number] Date of Birth: 1990/06/15   Medicare Important Message Given:  Yes    Camillo Flaming 09/25/2015, 10:14 AM

## 2015-09-25 NOTE — Progress Notes (Signed)
Blood removed for exchange transfusion with Sharolyn Douglas, RN bedside nurse monitoring patient throughout.  720 cc of 1000 cc removed and further removal discontinued due to SBP ranging from 74-88.  Sharolyn Douglas, RN notifying care provider of SBP and amount of blood removed.  Patient becoming sleepy during removal and dropped her phone several times due to nodding off.  PICC line flushed after each syringe for a total of 120 cc NS, patient also receiving 500 cc NS bolus during exchange removal period. Carolee Rota, RN VAST

## 2015-09-25 NOTE — Progress Notes (Signed)
OB nurse performing fetal nonstress test at this time. Will endorsed to night nurse.

## 2015-09-25 NOTE — Progress Notes (Signed)
Paper jammed, adjusted

## 2015-09-25 NOTE — Progress Notes (Signed)
PICC line site  R arm,clean dry and intact. Patient's vital signs stable at this time. Will continue to monitor.

## 2015-09-25 NOTE — Procedures (Signed)
US/fluoro guided right DL basilic vein PICC placed. Length 35 cm. Tip SVC/RA junction. No immediate complications. Medications used- 1% lidocaine to skin and SQ tissue.

## 2015-09-25 NOTE — Progress Notes (Signed)
MEDICATION RELATED CONSULT NOTE   IR Procedure Consult - Anticoagulant/Antiplatelet PTA/Inpatient Med List Review by Pharmacist    Procedure:   US/fluoro guided right DL basilic vein PICC placed. Completed: 5/12 1641 Post-Procedural bleeding risk per IR MD assessment:  Low  Antithrombotic medications on inpatient or PTA profile prior to procedure:   Lovenox 40 mg SQ q24h, last dose 5/10 at 0125 and patient has been refusing doses since then.   Recommended restart time per IR Post-Procedure Guidelines:  Day 0 (at least 4 hours or at next standard dose interval)   Plan:     Resume Lovenox 40 mg SQ q24h as scheduled at 2200 tonight.  Hershal Coria, PharmD, BCPS Pager: [phone] 09/25/2015 5:05 PM

## 2015-09-25 NOTE — Progress Notes (Signed)
SICKLE CELL SERVICE PROGRESS NOTE  [name] [id number] DOB: Apr 14, 1991 DOA: 09/22/2015 PCP: Angelica Chessman, MD  Assessment/Plan: Principal Problem:   Hb-SS disease with crisis ([place]) Active Problems:   Maternal sickle cell anemia affecting pregnancy, antepartum ([place])  1. Hb SS with crisis: Continue Dilaudid PCA at current dose and and continue clinician assisted doses on a when necessary basis. She is eating and drinking well so I will decrease her IV fluids also. She is unable to receive NSAIDs due to her pregnancy and will continue to hydrate her with hypotonic fluid. Results of hemoglobin electrophoresis still not available. We'll follow-up tomorrow 2. Leukocytosis: No evidence of infection. The leukocytosis is likely associated with the inflammatory state associated with her crisis. 3. Anemia of chronic disease: The patient has a hemoglobin of 7.9 which is essentially at the target hemoglobin of 8 recommended in a pregnant patient with sickle cell. I expect that with hydration she'll have some dilutional effect and may need a transfusion while she is hospitalized. 4. Chronic pain: Continue methadone 5 mg daily at bedtime. 5. Third trimester pregnancy: Patient alert trimester of her pregnancy. Approximately [redacted] weeks gestation age. A follow-up of her hemoglobin electrophoresis shows that her hemoglobin S percentage is at 70.4%. I have ordered a partial exchange transfusions with 1 L of blood out and 2 units of red blood cells transfused in. Patient will need PICC line placement in order for partial exchange transfusion to be done.   Code Status: Full Code Family Communication: N/A Disposition Plan: Not yet ready for discharge  [place].  Pager [phone]. If 7PM-7AM, please contact night-coverage.  09/25/2015, 4:30 PM  LOS: 3 days   Interim History: Patient reports her pain is 7/10 and essentially localized to the left shoulder. Patient also reports that she is able  to move her arm with greater [place] than yesterday. She denies any other complaints at this time. She has used 26.4 mg with 46/44: Demands deliveries on the PCA in the last 24 hours. Additionally she has used 20 mg of Dilaudid and clinician assisted doses. The patient is unable to receive Toradol secondary to current pregnancy. I spoke with her primary care physician Dr. Doreene Burke yesterday and he asked that I follow up on her hemoglobin electrophoresis that was done a few days ago. The patient is currently pregnant and has required serial exchange transfusions to support her pregnancy to a goal of hemoglobin S percentage of 30% and a total hemoglobin of 8 g/dL or above.  Consultants:  Obstetrics rapid response team  Procedures:  None   Antibiotics:  None    Objective: Filed Vitals:   09/25/15 0636 09/25/15 0750 09/25/15 1034 09/25/15 1200  BP: 116/83  122/65   Pulse: 103  100   Temp: 98.4 F (36.9 C)  97.7 F (36.5 C)   TempSrc: Oral  Oral   Resp: 13 12 14 13   Height:      Weight:      SpO2: 95% 97% 96% 98%   Weight change:   Intake/Output Summary (Last 24 hours) at 09/25/15 1630 Last data filed at 09/25/15 1441  Gross per 24 hour  Intake 1524.25 ml  Output      0 ml  Net 1524.25 ml       General: Alert, awake, oriented x3, in mild distress secondary to pain.  HEENT: [place]/AT PEERL, EOMI, anicteric Neck: Trachea midline,  no masses, no thyromegal,y no JVD, no carotid bruit OROPHARYNX:  Moist, No exudate/ erythema/lesions.  Heart: Regular rate and rhythm, without murmurs, rubs, gallops, PMI non-displaced, no heaves or thrills on palpation.  Lungs: Clear to auscultation, no wheezing or rhonchi noted. No increased vocal fremitus resonant to percussion  Abdomen: Gravid  Neuro: No focal neurological deficits noted cranial nerves II through XII grossly intact. Strength  in bilateral upper and lower extremities. Musculoskeletal: No warm swelling or erythema around joints, no  spinal tenderness noted. Psychiatric: Patient alert and oriented x3, good insight and cognition, good recent to remote recall.    Data Reviewed: Basic Metabolic Panel:  Recent Labs Lab 09/23/15 0952  NA 137  K 3.8  CL 108  CO2 22  GLUCOSE 97  BUN <5*  CREATININE 0.37*  CALCIUM 8.7*   Liver Function Tests:  Recent Labs Lab 09/23/15 0952  AST 64*  ALT 115*  ALKPHOS 100  BILITOT 3.1*  PROT 6.3*  ALBUMIN 2.8*   No results for input(s): LIPASE, AMYLASE in the last 168 hours. No results for input(s): AMMONIA in the last 168 hours. CBC:  Recent Labs Lab 09/22/15 1046 09/22/15 2003 09/25/15 1057  WBC 15.1* 15.1* 15.8*  NEUTROABS 12684*  --  12.2*  HGB 8.2* 7.9* 7.4*  HCT 25.1* 22.8* 21.6*  MCV 90.9 86.4 87.8  PLT 562* 590* 527*   Cardiac Enzymes: No results for input(s): CKTOTAL, CKMB, CKMBINDEX, TROPONINI in the last 168 hours. BNP (last 3 results) No results for input(s): BNP in the last 8760 hours.  ProBNP (last 3 results) No results for input(s): PROBNP in the last 8760 hours.  CBG: No results for input(s): GLUCAP in the last 168 hours.  No results found for this or any previous visit (from the past 240 hour(s)).   Studies: Dg Chest 2 View  09/07/2015  CLINICAL DATA:  Abdominal pain starting 5:30 this morning, history of sickle cell disease, [redacted] weeks pregnant EXAM: CHEST  2 VIEW COMPARISON:  08/11/2015 FINDINGS: Cardiomediastinal silhouette is stable. No acute infiltrate or pleural effusion. No pulmonary edema. Stable pleural parenchymal scarring in right upper lobe. Stable small calcified granulomas. Stable chronic sclerotic changes bilateral humeral heads consistent with sickle cell sequela. IMPRESSION: No acute infiltrate or pleural effusion. No pulmonary edema. Stable pleural parenchymal scarring in right upper lobe. Stable small calcified granulomas. Electronically Signed   By: Lahoma Crocker M.D.   On: 09/07/2015 09:24   Ir Fluoro Guide Cv Line  Left  09/10/2015  INDICATION: Poor venous access. Sickle cell anemia. Request PICC line placement. EXAM: LEFT UPPER EXTREMITY MIDLINE PICC LINE PLACEMENT WITH ULTRASOUND AND FLUOROSCOPIC GUIDANCE MEDICATIONS: None; The antibiotic was administered within an appropriate time interval prior to skin puncture. ANESTHESIA/SEDATION: Moderate Sedation Time:  None The patient was continuously monitored during the procedure by the interventional radiology nurse under my direct supervision. FLUOROSCOPY TIME:  Fluoroscopy Time: 9 minutes 36 seconds COMPLICATIONS: None immediate. PROCEDURE: The patient was advised of the possible risks and complications and agreed to undergo the procedure. The patient was then brought to the angiographic suite for the procedure. The left arm was prepped with chlorhexidine, draped in the usual sterile fashion using maximum barrier technique (cap and mask, sterile gown, sterile gloves, large sterile sheet, hand hygiene and cutaneous antiseptic). Local anesthesia was attained by infiltration with 1% lidocaine. Ultrasound demonstrated patency of the brachial vein, and this was documented with an image. Under real-time ultrasound guidance, this vein was accessed with a 21 gauge micropuncture needle and image documentation was performed. The needle was exchanged over a guidewire for a  peel-away sheath, however the catheter could not be advanced into the SVC despite multiple attempts. Ultimately, the decision was made to leave the catheter as a midline catheter and a 24 cm 5 French dual lumen PICC was advanced, and positioned with its tip at the mid left subclavian. Fluoroscopy during the procedure and fluoro spot radiograph confirms appropriate catheter position. The catheter was flushed, secured to the skin, and covered with a sterile dressing. IMPRESSION: Successful placement of a left arm midline PICC with sonographic and fluoroscopic guidance. The catheter is ready for use. Read by: Ascencion Dike  PA-C Electronically Signed   By: Sandi Mariscal M.D.   On: 09/10/2015 16:53   Ir US Guide Vasc Access Left  09/10/2015  INDICATION: Poor venous access. Sickle cell anemia. Request PICC line placement. EXAM: LEFT UPPER EXTREMITY MIDLINE PICC LINE PLACEMENT WITH ULTRASOUND AND FLUOROSCOPIC GUIDANCE MEDICATIONS: None; The antibiotic was administered within an appropriate time interval prior to skin puncture. ANESTHESIA/SEDATION: Moderate Sedation Time:  None The patient was continuously monitored during the procedure by the interventional radiology nurse under my direct supervision. FLUOROSCOPY TIME:  Fluoroscopy Time: 9 minutes 36 seconds COMPLICATIONS: None immediate. PROCEDURE: The patient was advised of the possible risks and complications and agreed to undergo the procedure. The patient was then brought to the angiographic suite for the procedure. The left arm was prepped with chlorhexidine, draped in the usual sterile fashion using maximum barrier technique (cap and mask, sterile gown, sterile gloves, large sterile sheet, hand hygiene and cutaneous antiseptic). Local anesthesia was attained by infiltration with 1% lidocaine. Ultrasound demonstrated patency of the brachial vein, and this was documented with an image. Under real-time ultrasound guidance, this vein was accessed with a 21 gauge micropuncture needle and image documentation was performed. The needle was exchanged over a guidewire for a peel-away sheath, however the catheter could not be advanced into the SVC despite multiple attempts. Ultimately, the decision was made to leave the catheter as a midline catheter and a 24 cm 5 French dual lumen PICC was advanced, and positioned with its tip at the mid left subclavian. Fluoroscopy during the procedure and fluoro spot radiograph confirms appropriate catheter position. The catheter was flushed, secured to the skin, and covered with a sterile dressing. IMPRESSION: Successful placement of a left arm midline  PICC with sonographic and fluoroscopic guidance. The catheter is ready for use. Read by: Ascencion Dike PA-C Electronically Signed   By: Sandi Mariscal M.D.   On: 09/10/2015 16:53   [place] Mfm Fetal Bpp Wo Non Stress  09/22/2015  OBSTETRICAL ULTRASOUND: This exam was performed within a [facility]. The OB US report was generated in the AS system, and faxed to the ordering physician.  This report is available in the [facility]. See the AS Obstetric US report via the Image Link.  [place] Mfm Ob Follow Up  09/22/2015  OBSTETRICAL ULTRASOUND: This exam was performed within a [facility] Department. The OB US report was generated in the AS system, and faxed to the ordering physician.  This report is available in the [facility]. See the AS Obstetric US report via the Image Link.   Scheduled Meds: . sodium chloride   Intravenous Once  . enoxaparin (LOVENOX) injection  40 mg Subcutaneous Q24H  . folic acid  1 mg Oral Daily  . HYDROmorphone   Intravenous Q4H  . lidocaine      . methadone  5 mg Oral QHS  . polyethylene glycol  17  g Oral Daily  . prenatal vitamin w/FE, FA  1 tablet Oral Q1200  . senna-docusate  1 tablet Oral BID  . sodium chloride  500 mL Intravenous Once   Continuous Infusions: . dextrose 5 % and 0.45% NaCl 1,000 mL (09/25/15 1516)    Time spent 25 minutes.

## 2015-09-25 NOTE — Care Management Important Message (Deleted)
Important Message  Patient Details  Name: [name] MRN: [id number] Date of Birth: [birth year]   Medicare Important Message Given:  Yes    Camillo Flaming 09/25/2015, 10:17 AMImportant Message  Patient Details  Name: [name] MRN: [id number] Date of Birth: Apr 25, 1991   Medicare Important Message Given:  Yes    Camillo Flaming 09/25/2015, 10:17 AM

## 2015-09-26 ENCOUNTER — Encounter (HOSPITAL_COMMUNITY): Payer: Self-pay | Admitting: Obstetrics & Gynecology

## 2015-09-26 DIAGNOSIS — O98313 Other infections with a predominantly sexual mode of transmission complicating pregnancy, third trimester: Secondary | ICD-10-CM | POA: Diagnosis not present

## 2015-09-26 DIAGNOSIS — A6000 Herpesviral infection of urogenital system, unspecified: Secondary | ICD-10-CM | POA: Diagnosis not present

## 2015-09-26 DIAGNOSIS — O99320 Drug use complicating pregnancy, unspecified trimester: Secondary | ICD-10-CM

## 2015-09-26 DIAGNOSIS — B009 Herpesviral infection, unspecified: Secondary | ICD-10-CM

## 2015-09-26 DIAGNOSIS — F1910 Other psychoactive substance abuse, uncomplicated: Secondary | ICD-10-CM

## 2015-09-26 DIAGNOSIS — D5700 Hb-SS disease with crisis, unspecified: Secondary | ICD-10-CM | POA: Diagnosis not present

## 2015-09-26 DIAGNOSIS — D571 Sickle-cell disease without crisis: Secondary | ICD-10-CM | POA: Diagnosis not present

## 2015-09-26 DIAGNOSIS — M549 Dorsalgia, unspecified: Secondary | ICD-10-CM | POA: Diagnosis not present

## 2015-09-26 DIAGNOSIS — O98519 Other viral diseases complicating pregnancy, unspecified trimester: Secondary | ICD-10-CM

## 2015-09-26 DIAGNOSIS — D638 Anemia in other chronic diseases classified elsewhere: Secondary | ICD-10-CM | POA: Diagnosis not present

## 2015-09-26 DIAGNOSIS — O99013 Anemia complicating pregnancy, third trimester: Secondary | ICD-10-CM | POA: Diagnosis not present

## 2015-09-26 DIAGNOSIS — Z3A32 32 weeks gestation of pregnancy: Secondary | ICD-10-CM | POA: Diagnosis not present

## 2015-09-26 DIAGNOSIS — O99019 Anemia complicating pregnancy, unspecified trimester: Secondary | ICD-10-CM | POA: Diagnosis not present

## 2015-09-26 NOTE — Progress Notes (Signed)
Patient ID: [name], female   DOB: Sep 12, 1990, 25 y.o.   MRN: [id number] SICKLE CELL SERVICE PROGRESS NOTE  [name] [id number] DOB: 07-Oct-1990 DOA: 09/22/2015 PCP: Angelica Chessman, MD  Assessment/Plan: Principal Problem:   Hb-SS disease with crisis [facility]) Active Problems:   Maternal sickle cell anemia affecting pregnancy, antepartum ([name])   Herpes simplex type 2 (HSV-2) infection affecting pregnancy, antepartum   Maternal substance abuse, antepartum   1. Hb SS with crisis: Patient rates her pain at 8/10 this morning, saying "I always have more pain when I wake up". Otherwise she feels a lot better, got partial exchange transfusion yesterday. Continue Dilaudid PCA at current dose, continue clinician assisted doses on a when necessary basis. She is eating and drinking well. She is unable to receive NSAIDs due to her pregnancy and will continue to hydrate her with hypotonic fluid. 2. Leukocytosis: No evidence of infection. The leukocytosis is likely associated with the inflammatory state associated with her crisis. Continue to monitor 3. Anemia of chronic disease: The patient has a hemoglobin of 7.4 before the partial exchange transfusion, target hemoglobin recommended in a pregnant patient with sickle cell is 8. Will check hemoglobin in am. 4. Chronic pain: Continue methadone 5 mg daily at bedtime. 5. Third trimester pregnancy: She is at [redacted] weeks gestation. She is status post partial exchange blood transfusion, patient is doing well post transfusion, will recheck hemoglobin in a.m. High risk pregnancy, her obstetrician is on board. No issues so far. 6.  Code Status: Full Code Family Communication: N/A Disposition Plan: For possible discharge tomorrow.  [name]  If 7PM-7AM, please contact night-coverage.  09/26/2015, 3:38 PM  LOS: 4 days   Interval History:  Patient reported increased pain this morning but usual for her. She has improved significantly  otherwise. No fever, no headache. She feels fetal movement.  Consultants:  Obstetrician  Procedures:  None  Antibiotics:  None  Objective: Filed Vitals:   09/26/15 0809 09/26/15 1036 09/26/15 1109 09/26/15 1500  BP:  107/48  124/72  Pulse:  118  104  Temp:  98.9 F (37.2 C)  98.4 F (36.9 C)  TempSrc:  Oral  Oral  Resp: 16 16 18 18   Height:      Weight:      SpO2: 98% 95% 98% 96%   Weight change:   Intake/Output Summary (Last 24 hours) at 09/26/15 1538 Last data filed at 09/26/15 1303  Gross per 24 hour  Intake   1929 ml  Output    720 ml  Net   1209 ml   General: Alert, awake, oriented x3, in no acute distress.  HEENT: [place]/AT PEERL, EOMI Neck: Trachea midline,  no masses, no thyromegal,y no JVD, no carotid bruit OROPHARYNX:  Moist, No exudate/ erythema/lesions.  Heart: Regular rate and rhythm, without murmurs, rubs, gallops, PMI non-displaced, no heaves or thrills on palpation.  Lungs: Clear to auscultation, no wheezing or rhonchi noted. No increased vocal fremitus resonant to percussion  Abdomen: Gravid. Soft, nontender, nondistended, positive bowel sounds, no masses no hepatosplenomegaly noted..  Neuro: No focal neurological deficits noted cranial nerves II through XII grossly intact. DTRs 2+ bilaterally upper and lower extremities. Strength 5 out of 5 in bilateral upper and lower extremities. Musculoskeletal: No warm swelling or erythema around joints, no spinal tenderness noted. Psychiatric: Patient alert and oriented x3, good insight and cognition, good recent to remote recall. Lymph node survey: No cervical axillary or inguinal lymphadenopathy noted.  Data Reviewed: Basic Metabolic  Panel:  Recent Labs Lab 09/23/15 0952  NA 137  K 3.8  CL 108  CO2 22  GLUCOSE 97  BUN <5*  CREATININE 0.37*  CALCIUM 8.7*   Liver Function Tests:  Recent Labs Lab 09/23/15 0952  AST 64*  ALT 115*  ALKPHOS 100  BILITOT 3.1*  PROT 6.3*  ALBUMIN 2.8*   No  results for input(s): LIPASE, AMYLASE in the last 168 hours. No results for input(s): AMMONIA in the last 168 hours. CBC:  Recent Labs Lab 09/22/15 1046 09/22/15 2003 09/25/15 1057  WBC 15.1* 15.1* 15.8*  NEUTROABS 12684*  --  12.2*  HGB 8.2* 7.9* 7.4*  HCT 25.1* 22.8* 21.6*  MCV 90.9 86.4 87.8  PLT 562* 590* 527*   Cardiac Enzymes: No results for input(s): CKTOTAL, CKMB, CKMBINDEX, TROPONINI in the last 168 hours. BNP (last 3 results) No results for input(s): BNP in the last 8760 hours.  ProBNP (last 3 results) No results for input(s): PROBNP in the last 8760 hours.  CBG: No results for input(s): GLUCAP in the last 168 hours.  No results found for this or any previous visit (from the past 240 hour(s)).   Studies: Dg Chest 2 View  09/07/2015  CLINICAL DATA:  Abdominal pain starting 5:30 this morning, history of sickle cell disease, [redacted] weeks pregnant EXAM: CHEST  2 VIEW COMPARISON:  08/11/2015 FINDINGS: Cardiomediastinal silhouette is stable. No acute infiltrate or pleural effusion. No pulmonary edema. Stable pleural parenchymal scarring in right upper lobe. Stable small calcified granulomas. Stable chronic sclerotic changes bilateral humeral heads consistent with sickle cell sequela. IMPRESSION: No acute infiltrate or pleural effusion. No pulmonary edema. Stable pleural parenchymal scarring in right upper lobe. Stable small calcified granulomas. Electronically Signed   By: Lahoma Crocker M.D.   On: 09/07/2015 09:24   Ir Fluoro Guide Cv Line Left  09/10/2015  INDICATION: Poor venous access. Sickle cell anemia. Request PICC line placement. EXAM: LEFT UPPER EXTREMITY MIDLINE PICC LINE PLACEMENT WITH ULTRASOUND AND FLUOROSCOPIC GUIDANCE MEDICATIONS: None; The antibiotic was administered within an appropriate time interval prior to skin puncture. ANESTHESIA/SEDATION: Moderate Sedation Time:  None The patient was continuously monitored during the procedure by the interventional radiology  nurse under my direct supervision. FLUOROSCOPY TIME:  Fluoroscopy Time: 9 minutes 36 seconds COMPLICATIONS: None immediate. PROCEDURE: The patient was advised of the possible risks and complications and agreed to undergo the procedure. The patient was then brought to the angiographic suite for the procedure. The left arm was prepped with chlorhexidine, draped in the usual sterile fashion using maximum barrier technique (cap and mask, sterile gown, sterile gloves, large sterile sheet, hand hygiene and cutaneous antiseptic). Local anesthesia was attained by infiltration with 1% lidocaine. Ultrasound demonstrated patency of the brachial vein, and this was documented with an image. Under real-time ultrasound guidance, this vein was accessed with a 21 gauge micropuncture needle and image documentation was performed. The needle was exchanged over a guidewire for a peel-away sheath, however the catheter could not be advanced into the SVC despite multiple attempts. Ultimately, the decision was made to leave the catheter as a midline catheter and a 24 cm 5 French dual lumen PICC was advanced, and positioned with its tip at the mid left subclavian. Fluoroscopy during the procedure and fluoro spot radiograph confirms appropriate catheter position. The catheter was flushed, secured to the skin, and covered with a sterile dressing. IMPRESSION: Successful placement of a left arm midline PICC with sonographic and fluoroscopic guidance. The catheter is ready  for use. Read by: Ascencion Dike PA-C Electronically Signed   By: Sandi Mariscal M.D.   On: 09/10/2015 16:53   Ir Fluoro Guide Cv Line Right  09/25/2015  INDICATION: Sickle-cell crisis, [redacted] weeks pregnant, poor venous access. Request made for central venous access for medications /fluids. EXAM: RIGHT UPPER EXTREMITY PICC LINE PLACEMENT WITH ULTRASOUND AND FLUOROSCOPIC GUIDANCE MEDICATIONS: 1% lidocaine to skin and subcutaneous tissue ANESTHESIA/SEDATION: None FLUOROSCOPY TIME:   Fluoroscopy Time:  24 seconds COMPLICATIONS: None immediate. PROCEDURE: The patient was advised of the possible risks and complications and agreed to undergo the procedure. The patient was then brought to the angiographic suite for the procedure. The right arm was prepped with chlorhexidine, draped in the usual sterile fashion using maximum barrier technique (cap and mask, sterile gown, sterile gloves, large sterile sheet, hand hygiene and cutaneous antisepsis) and infiltrated locally with 1% Lidocaine. Ultrasound demonstrated patency of the right basilic vein, and this was documented with an image. Under real-time ultrasound guidance, this vein was accessed with a 21 gauge micropuncture needle and image documentation was performed. A 0.018 wire was introduced in to the vein. Over this, a 5 [place] double lumen power injectable PICC was advanced to the lower SVC/right atrial junction. Fluoroscopy during the procedure and fluoro spot radiograph confirms appropriate catheter position. The catheter was flushed and covered with a sterile dressing. Catheter length: 35 cm IMPRESSION: Successful right arm Power PICC line placement with ultrasound and fluoroscopic guidance. The catheter is ready for use. Read by: Rowe Robert, PA-C Electronically Signed   By: Aletta Edouard M.D.   On: 09/25/2015 17:05   Ir US Guide Vasc Access Left  09/10/2015  INDICATION: Poor venous access. Sickle cell anemia. Request PICC line placement. EXAM: LEFT UPPER EXTREMITY MIDLINE PICC LINE PLACEMENT WITH ULTRASOUND AND FLUOROSCOPIC GUIDANCE MEDICATIONS: None; The antibiotic was administered within an appropriate time interval prior to skin puncture. ANESTHESIA/SEDATION: Moderate Sedation Time:  None The patient was continuously monitored during the procedure by the interventional radiology nurse under my direct supervision. FLUOROSCOPY TIME:  Fluoroscopy Time: 9 minutes 36 seconds COMPLICATIONS: None immediate. PROCEDURE: The patient was  advised of the possible risks and complications and agreed to undergo the procedure. The patient was then brought to the angiographic suite for the procedure. The left arm was prepped with chlorhexidine, draped in the usual sterile fashion using maximum barrier technique (cap and mask, sterile gown, sterile gloves, large sterile sheet, hand hygiene and cutaneous antiseptic). Local anesthesia was attained by infiltration with 1% lidocaine. Ultrasound demonstrated patency of the brachial vein, and this was documented with an image. Under real-time ultrasound guidance, this vein was accessed with a 21 gauge micropuncture needle and image documentation was performed. The needle was exchanged over a guidewire for a peel-away sheath, however the catheter could not be advanced into the SVC despite multiple attempts. Ultimately, the decision was made to leave the catheter as a midline catheter and a 24 cm 5 French dual lumen PICC was advanced, and positioned with its tip at the mid left subclavian. Fluoroscopy during the procedure and fluoro spot radiograph confirms appropriate catheter position. The catheter was flushed, secured to the skin, and covered with a sterile dressing. IMPRESSION: Successful placement of a left arm midline PICC with sonographic and fluoroscopic guidance. The catheter is ready for use. Read by: Ascencion Dike PA-C Electronically Signed   By: Sandi Mariscal M.D.   On: 09/10/2015 16:53   Ir US Guide Vasc Access Right  09/25/2015  INDICATION:  Sickle-cell crisis, [redacted] weeks pregnant, poor venous access. Request made for central venous access for medications /fluids. EXAM: RIGHT UPPER EXTREMITY PICC LINE PLACEMENT WITH ULTRASOUND AND FLUOROSCOPIC GUIDANCE MEDICATIONS: 1% lidocaine to skin and subcutaneous tissue ANESTHESIA/SEDATION: None FLUOROSCOPY TIME:  Fluoroscopy Time:  24 seconds COMPLICATIONS: None immediate. PROCEDURE: The patient was advised of the possible risks and complications and agreed to  undergo the procedure. The patient was then brought to the angiographic suite for the procedure. The right arm was prepped with chlorhexidine, draped in the usual sterile fashion using maximum barrier technique (cap and mask, sterile gown, sterile gloves, large sterile sheet, hand hygiene and cutaneous antisepsis) and infiltrated locally with 1% Lidocaine. Ultrasound demonstrated patency of the right basilic vein, and this was documented with an image. Under real-time ultrasound guidance, this vein was accessed with a 21 gauge micropuncture needle and image documentation was performed. A 0.018 wire was introduced in to the vein. Over this, a 5 [place] double lumen power injectable PICC was advanced to the lower SVC/right atrial junction. Fluoroscopy during the procedure and fluoro spot radiograph confirms appropriate catheter position. The catheter was flushed and covered with a sterile dressing. Catheter length: 35 cm IMPRESSION: Successful right arm Power PICC line placement with ultrasound and fluoroscopic guidance. The catheter is ready for use. Read by: Rowe Robert, PA-C Electronically Signed   By: Aletta Edouard M.D.   On: 09/25/2015 17:05   [place] Mfm Fetal Bpp Wo Non Stress  09/22/2015  OBSTETRICAL ULTRASOUND: This exam was performed within a [place] [facility]. The OB US report was generated in the AS system, and faxed to the ordering physician.  This report is available in the [facility]. See the AS Obstetric US report via the Image Link.  [place] Mfm Ob Follow Up  09/22/2015  OBSTETRICAL ULTRASOUND: This exam was performed within a [place] [facility]. The OB US report was generated in the AS system, and faxed to the ordering physician.  This report is available in the [facility]. See the AS Obstetric US report via the Image Link.  Scheduled Meds: . enoxaparin (LOVENOX) injection  40 mg Subcutaneous Q24H  . folic acid  1 mg Oral Daily  . HYDROmorphone   Intravenous Q4H  .  methadone  5 mg Oral QHS  . polyethylene glycol  17 g Oral Daily  . prenatal vitamin w/FE, FA  1 tablet Oral Q1200  . senna-docusate  1 tablet Oral BID   Continuous Infusions: . dextrose 5 % and 0.45% NaCl 100 mL/hr at 09/26/15 0930   Principal Problem:   Hb-SS disease with crisis [facility]) Active Problems:   Maternal sickle cell anemia affecting pregnancy, antepartum ([name])   Herpes simplex type 2 (HSV-2) infection affecting pregnancy, antepartum   Maternal substance abuse, antepartum

## 2015-09-26 NOTE — Progress Notes (Signed)
Nursing Note: Able to detect fetal heart rate w/ doppler.Pt resting quietly w/o complaints.wbb

## 2015-09-26 NOTE — Progress Notes (Signed)
Dr. Ihor Dow called. FHR baseline 135-140BPM, minimal variability, 10x10 accels, no decels. 3 mild uc's in 30 in. No vaginal bleeding or leaking of fluid. Pt says she might be dc'd home tomorrow. Does not want to start prophalaxsis for HSV until 36 weeks.

## 2015-09-26 NOTE — Progress Notes (Signed)
Nursing Note: At bedside with pt and IV nurses for exchange.Order to pull 1L of bllod but pt unable to tolerate BP dropped to 74/30 p-110.Iv nurse pulled 720 ml blood.Paged on-call and made aware of just mentioned.Orders for 500 NS bolus and will start 2 units of blood promptly.wbb

## 2015-09-26 NOTE — Progress Notes (Signed)
Pt resting quietly. Both units of blood in BP-110/74 p-92.Pt tolerated well w/o sx of reaction.wbb

## 2015-09-27 DIAGNOSIS — D638 Anemia in other chronic diseases classified elsewhere: Secondary | ICD-10-CM | POA: Diagnosis not present

## 2015-09-27 DIAGNOSIS — B009 Herpesviral infection, unspecified: Secondary | ICD-10-CM

## 2015-09-27 DIAGNOSIS — D5700 Hb-SS disease with crisis, unspecified: Secondary | ICD-10-CM | POA: Diagnosis not present

## 2015-09-27 DIAGNOSIS — O99013 Anemia complicating pregnancy, third trimester: Secondary | ICD-10-CM | POA: Diagnosis not present

## 2015-09-27 DIAGNOSIS — O98513 Other viral diseases complicating pregnancy, third trimester: Secondary | ICD-10-CM

## 2015-09-27 DIAGNOSIS — A6000 Herpesviral infection of urogenital system, unspecified: Secondary | ICD-10-CM | POA: Diagnosis not present

## 2015-09-27 DIAGNOSIS — M549 Dorsalgia, unspecified: Secondary | ICD-10-CM | POA: Diagnosis not present

## 2015-09-27 DIAGNOSIS — Z3A32 32 weeks gestation of pregnancy: Secondary | ICD-10-CM | POA: Diagnosis not present

## 2015-09-27 DIAGNOSIS — O98313 Other infections with a predominantly sexual mode of transmission complicating pregnancy, third trimester: Secondary | ICD-10-CM | POA: Diagnosis not present

## 2015-09-27 DIAGNOSIS — O99019 Anemia complicating pregnancy, unspecified trimester: Secondary | ICD-10-CM | POA: Diagnosis not present

## 2015-09-27 LAB — CBC WITH DIFFERENTIAL/PLATELET
BASOS PCT: 0 %
Basophils Absolute: 0 10*3/uL (ref 0.0–0.1)
EOS ABS: 0.4 10*3/uL (ref 0.0–0.7)
Eosinophils Relative: 3 %
HEMATOCRIT: 23.9 % — AB (ref 36.0–46.0)
HEMOGLOBIN: 8.3 g/dL — AB (ref 12.0–15.0)
LYMPHS ABS: 1.9 10*3/uL (ref 0.7–4.0)
Lymphocytes Relative: 13 %
MCH: 30.4 pg (ref 26.0–34.0)
MCHC: 34.7 g/dL (ref 30.0–36.0)
MCV: 87.5 fL (ref 78.0–100.0)
MONOS PCT: 8 %
Monocytes Absolute: 1.2 10*3/uL — ABNORMAL HIGH (ref 0.1–1.0)
NEUTROS ABS: 11.3 10*3/uL — AB (ref 1.7–7.7)
NEUTROS PCT: 76 %
Platelets: 473 10*3/uL — ABNORMAL HIGH (ref 150–400)
RBC: 2.73 MIL/uL — AB (ref 3.87–5.11)
RDW: 19.4 % — ABNORMAL HIGH (ref 11.5–15.5)
WBC: 14.8 10*3/uL — AB (ref 4.0–10.5)

## 2015-09-27 LAB — COMPREHENSIVE METABOLIC PANEL
ALBUMIN: 2.9 g/dL — AB (ref 3.5–5.0)
ALK PHOS: 103 U/L (ref 38–126)
ALT: 61 U/L — AB (ref 14–54)
ANION GAP: 8 (ref 5–15)
AST: 33 U/L (ref 15–41)
BILIRUBIN TOTAL: 2.3 mg/dL — AB (ref 0.3–1.2)
BUN: 6 mg/dL (ref 6–20)
CO2: 23 mmol/L (ref 22–32)
CREATININE: 0.55 mg/dL (ref 0.44–1.00)
Calcium: 8.5 mg/dL — ABNORMAL LOW (ref 8.9–10.3)
Chloride: 107 mmol/L (ref 101–111)
GFR calc Af Amer: >60 mL/min (ref >60)
GFR calc non Af Amer: >60 mL/min (ref >60)
GLUCOSE: 99 mg/dL (ref 65–99)
Potassium: 4 mmol/L (ref 3.5–5.1)
SODIUM: 138 mmol/L (ref 135–145)
TOTAL PROTEIN: 6.3 g/dL — AB (ref 6.5–8.1)

## 2015-09-27 LAB — AMNISURE RUPTURE OF MEMBRANE (ROM) NOT AT ARMC: Amnisure ROM: NEGATIVE

## 2015-09-27 LAB — WET PREP, GENITAL
Trich, Wet Prep: NONE SEEN
Yeast Wet Prep HPF POC: NONE SEEN

## 2015-09-27 MED ORDER — METRONIDAZOLE 500 MG PO TABS
500.0000 mg | ORAL_TABLET | Freq: Two times a day (BID) | ORAL | Status: DC
  Administered 2015-09-27: 500 mg via ORAL
  Filled 2015-09-27: qty 1

## 2015-09-27 MED ORDER — WITCH HAZEL-GLYCERIN EX PADS
MEDICATED_PAD | CUTANEOUS | Status: DC | PRN
  Filled 2015-09-27: qty 100

## 2015-09-27 MED ORDER — OXYCODONE HCL 15 MG PO TABS: 15.0000 mg | ORAL_TABLET | ORAL | Status: DC | PRN

## 2015-09-27 MED ORDER — METRONIDAZOLE 500 MG PO TABS: 500.0000 mg | ORAL_TABLET | Freq: Two times a day (BID) | ORAL | Status: AC

## 2015-09-27 NOTE — Discharge Summary (Addendum)
Physician Discharge Summary  [name] [id number] DOB: Feb 14, 1991 DOA: 09/22/2015  PCP: Angelica Chessman, MD  Admit date: 09/22/2015 Discharge date: 09/27/2015  Discharge Diagnoses:  Principal Problem:   Hb-SS disease with crisis [facility]) Active Problems:   Maternal sickle cell anemia affecting pregnancy, antepartum ([place])   Herpes simplex type 2 (HSV-2) infection affecting pregnancy, antepartum   Maternal substance abuse, antepartum  Discharge Condition: Stable  Diet: Regular Wt Readings from Last 3 Encounters:  09/24/15 83.5 kg (184 lb 1.4 oz)  09/22/15 78.472 kg (173 lb)  09/22/15 78.019 kg (172 lb)   History of present illness:  This is an opiate tolerant patient with hemoglobin SS with frequent hospitalizations who presented to the [facility] emergency department last night with complaints of acute sickle cell crisis while having what she thought was contractions. She was evaluated by the obstetrician's and found to be within normal limits with regard to her pregnancy. However she was admitted to [facility] hospital for management of sickle cell crisis. I received a call from the attending physician last night asking for guidance on dosing her pain medications. Based on the information presented to be a recommended the patient should be on a weight-based Dilaudid PCA with scheduled intermittent doses. However according to Dr. Elly Modena with my spoke this morning she said the patient was unable to receive weight-based dosing was its that based on a full dose Dilaudid PCA. The PCA was redosed based upon her weight and the patient was transferred over to [facility] for management of her sickle cell crisis. According to the patient she's been having pain in the shoulder region however the pain escalated in the past 24 hours to the point where she was able to manage him with oral analgesics. Denies any fevers, chills, vomiting, diarrhea or focal neurological deficits.  Currently she rates her pain as 8/10 and localized to the left shoulder, back and abdomen.  Hospital Course:  Patient was managed with Dilaudid PCA and other adjunct therapies. No Toradol because of pregnancy. Patient got partial exchange transfusion for Hemoglobin S of 70.4%. She complained of pain down the sides of her abdomen and tightness across the top of her abdomen on 09/26/2015, She also reported some increased discharge, and the RN reported that when she stood up from the bed, there was an approximately 8 inch long, 3 inch wide light yellow wet stain with some creamy white discharge as well on the sheet. Pt reports when she wipes, she gets a lot of creamy white discharge. OB Rapid Response team evaluated patient,  Amnisure Rupture was sent, came back negative, vaginal swab was present for BV, she was started on metronidazole. Hb was 8.3 at the time of discharge. Abdominal cramp resolved, her sickle cell pain returned to baseline, she had no new symptom. She was discharged home in stable condition. She has appointment with OB on Tuesday 09/29/2015 and Sickle cell clinic in one week.  Discharge Exam: Filed Vitals:   09/27/15 0800 09/27/15 1015  BP:  114/64  Pulse:  99  Temp:  98.7 F (37.1 C)  Resp: 16 18   Filed Vitals:   09/27/15 0329 09/27/15 0549 09/27/15 0800 09/27/15 1015  BP:  103/63  114/64  Pulse:  106  99  Temp:  97.9 F (36.6 C)  98.7 F (37.1 C)  TempSrc:  Oral  Oral  Resp: 16 16 16 18   Height:      Weight:      SpO2: 96% 98% 97%  98%   General: Alert, awake, oriented x3, in no acute distress.  HEENT: [place]/AT PEERL, EOMI Neck: Trachea midline, no masses, no thyromegal,y no JVD, no carotid bruit OROPHARYNX: Moist, No exudate/ erythema/lesions.  Heart: Regular rate and rhythm, without murmurs, rubs, gallops, PMI non-displaced, no heaves or thrills on palpation.  Lungs: Clear to auscultation, no wheezing or rhonchi noted. No increased vocal fremitus resonant to  percussion  Abdomen: Gravid. Soft, nontender, nondistended, positive bowel sounds, no masses no hepatosplenomegaly noted..  Neuro: No focal neurological deficits noted cranial nerves II through XII grossly intact. DTRs 2+ bilaterally upper and lower extremities. Strength 5 out of 5 in bilateral upper and lower extremities. Musculoskeletal: No warm swelling or erythema around joints, no spinal tenderness noted. Psychiatric: Patient alert and oriented x3, good insight and cognition, good recent to remote recall. Lymph node survey: No cervical axillary or inguinal lymphadenopathy noted.  Discharge Instructions    Medication List    TAKE these medications        calcium carbonate 500 MG chewable tablet  Commonly known as:  TUMS - dosed in mg elemental calcium  Chew 2 tablets by mouth 2 (two) times daily as needed for indigestion or heartburn.     folic acid 1 MG tablet  Commonly known as:  FOLVITE  Take 1 mg by mouth daily.     methadone 5 MG tablet  Commonly known as:  DOLOPHINE  Take 1 tablet (5 mg total) by mouth at bedtime.     metroNIDAZOLE 500 MG tablet  Commonly known as:  FLAGYL  Take 1 tablet (500 mg total) by mouth 2 (two) times daily.     oxyCODONE 15 MG immediate release tablet  Commonly known as:  ROXICODONE  Take 1 tablet (15 mg total) by mouth every 4 (four) hours as needed for pain.     prenatal multivitamin Tabs tablet  Take 1 tablet by mouth daily at 12 noon.       The results of significant diagnostics from this hospitalization (including imaging, microbiology, ancillary and laboratory) are listed below for reference.    Significant Diagnostic Studies: Dg Chest 2 View  09/07/2015  CLINICAL DATA:  Abdominal pain starting 5:30 this morning, history of sickle cell disease, [redacted] weeks pregnant EXAM: CHEST  2 VIEW COMPARISON:  08/11/2015 FINDINGS: Cardiomediastinal silhouette is stable. No acute infiltrate or pleural effusion. No pulmonary edema. Stable pleural  parenchymal scarring in right upper lobe. Stable small calcified granulomas. Stable chronic sclerotic changes bilateral humeral heads consistent with sickle cell sequela. IMPRESSION: No acute infiltrate or pleural effusion. No pulmonary edema. Stable pleural parenchymal scarring in right upper lobe. Stable small calcified granulomas. Electronically Signed   By: Lahoma Crocker M.D.   On: 09/07/2015 09:24   Ir Fluoro Guide Cv Line Left  09/10/2015  INDICATION: Poor venous access. Sickle cell anemia. Request PICC line placement. EXAM: LEFT UPPER EXTREMITY MIDLINE PICC LINE PLACEMENT WITH ULTRASOUND AND FLUOROSCOPIC GUIDANCE MEDICATIONS: None; The antibiotic was administered within an appropriate time interval prior to skin puncture. ANESTHESIA/SEDATION: Moderate Sedation Time:  None The patient was continuously monitored during the procedure by the interventional radiology nurse under my direct supervision. FLUOROSCOPY TIME:  Fluoroscopy Time: 9 minutes 36 seconds COMPLICATIONS: None immediate. PROCEDURE: The patient was advised of the possible risks and complications and agreed to undergo the procedure. The patient was then brought to the angiographic suite for the procedure. The left arm was prepped with chlorhexidine, draped in the usual sterile fashion using maximum  barrier technique (cap and mask, sterile gown, sterile gloves, large sterile sheet, hand hygiene and cutaneous antiseptic). Local anesthesia was attained by infiltration with 1% lidocaine. Ultrasound demonstrated patency of the brachial vein, and this was documented with an image. Under real-time ultrasound guidance, this vein was accessed with a 21 gauge micropuncture needle and image documentation was performed. The needle was exchanged over a guidewire for a peel-away sheath, however the catheter could not be advanced into the SVC despite multiple attempts. Ultimately, the decision was made to leave the catheter as a midline catheter and a 24 cm 5  French dual lumen PICC was advanced, and positioned with its tip at the mid left subclavian. Fluoroscopy during the procedure and fluoro spot radiograph confirms appropriate catheter position. The catheter was flushed, secured to the skin, and covered with a sterile dressing. IMPRESSION: Successful placement of a left arm midline PICC with sonographic and fluoroscopic guidance. The catheter is ready for use. Read by: Ascencion Dike PA-C Electronically Signed   By: Sandi Mariscal M.D.   On: 09/10/2015 16:53   Ir Fluoro Guide Cv Line Right  09/25/2015  INDICATION: Sickle-cell crisis, [redacted] weeks pregnant, poor venous access. Request made for central venous access for medications /fluids. EXAM: RIGHT UPPER EXTREMITY PICC LINE PLACEMENT WITH ULTRASOUND AND FLUOROSCOPIC GUIDANCE MEDICATIONS: 1% lidocaine to skin and subcutaneous tissue ANESTHESIA/SEDATION: None FLUOROSCOPY TIME:  Fluoroscopy Time:  24 seconds COMPLICATIONS: None immediate. PROCEDURE: The patient was advised of the possible risks and complications and agreed to undergo the procedure. The patient was then brought to the angiographic suite for the procedure. The right arm was prepped with chlorhexidine, draped in the usual sterile fashion using maximum barrier technique (cap and mask, sterile gown, sterile gloves, large sterile sheet, hand hygiene and cutaneous antisepsis) and infiltrated locally with 1% Lidocaine. Ultrasound demonstrated patency of the right basilic vein, and this was documented with an image. Under real-time ultrasound guidance, this vein was accessed with a 21 gauge micropuncture needle and image documentation was performed. A 0.018 wire was introduced in to the vein. Over this, a 5 [place] double lumen power injectable PICC was advanced to the lower SVC/right atrial junction. Fluoroscopy during the procedure and fluoro spot radiograph confirms appropriate catheter position. The catheter was flushed and covered with a sterile dressing.  Catheter length: 35 cm IMPRESSION: Successful right arm Power PICC line placement with ultrasound and fluoroscopic guidance. The catheter is ready for use. Read by: Rowe Robert, PA-C Electronically Signed   By: Aletta Edouard M.D.   On: 09/25/2015 17:05   Ir US Guide Vasc Access Left  09/10/2015  INDICATION: Poor venous access. Sickle cell anemia. Request PICC line placement. EXAM: LEFT UPPER EXTREMITY MIDLINE PICC LINE PLACEMENT WITH ULTRASOUND AND FLUOROSCOPIC GUIDANCE MEDICATIONS: None; The antibiotic was administered within an appropriate time interval prior to skin puncture. ANESTHESIA/SEDATION: Moderate Sedation Time:  None The patient was continuously monitored during the procedure by the interventional radiology nurse under my direct supervision. FLUOROSCOPY TIME:  Fluoroscopy Time: 9 minutes 36 seconds COMPLICATIONS: None immediate. PROCEDURE: The patient was advised of the possible risks and complications and agreed to undergo the procedure. The patient was then brought to the angiographic suite for the procedure. The left arm was prepped with chlorhexidine, draped in the usual sterile fashion using maximum barrier technique (cap and mask, sterile gown, sterile gloves, large sterile sheet, hand hygiene and cutaneous antiseptic). Local anesthesia was attained by infiltration with 1% lidocaine. Ultrasound demonstrated patency of the brachial vein,  and this was documented with an image. Under real-time ultrasound guidance, this vein was accessed with a 21 gauge micropuncture needle and image documentation was performed. The needle was exchanged over a guidewire for a peel-away sheath, however the catheter could not be advanced into the SVC despite multiple attempts. Ultimately, the decision was made to leave the catheter as a midline catheter and a 24 cm 5 French dual lumen PICC was advanced, and positioned with its tip at the mid left subclavian. Fluoroscopy during the procedure and fluoro spot  radiograph confirms appropriate catheter position. The catheter was flushed, secured to the skin, and covered with a sterile dressing. IMPRESSION: Successful placement of a left arm midline PICC with sonographic and fluoroscopic guidance. The catheter is ready for use. Read by: Ascencion Dike PA-C Electronically Signed   By: Sandi Mariscal M.D.   On: 09/10/2015 16:53   Ir US Guide Vasc Access Right  09/25/2015  INDICATION: Sickle-cell crisis, [redacted] weeks pregnant, poor venous access. Request made for central venous access for medications /fluids. EXAM: RIGHT UPPER EXTREMITY PICC LINE PLACEMENT WITH ULTRASOUND AND FLUOROSCOPIC GUIDANCE MEDICATIONS: 1% lidocaine to skin and subcutaneous tissue ANESTHESIA/SEDATION: None FLUOROSCOPY TIME:  Fluoroscopy Time:  24 seconds COMPLICATIONS: None immediate. PROCEDURE: The patient was advised of the possible risks and complications and agreed to undergo the procedure. The patient was then brought to the angiographic suite for the procedure. The right arm was prepped with chlorhexidine, draped in the usual sterile fashion using maximum barrier technique (cap and mask, sterile gown, sterile gloves, large sterile sheet, hand hygiene and cutaneous antisepsis) and infiltrated locally with 1% Lidocaine. Ultrasound demonstrated patency of the right basilic vein, and this was documented with an image. Under real-time ultrasound guidance, this vein was accessed with a 21 gauge micropuncture needle and image documentation was performed. A 0.018 wire was introduced in to the vein. Over this, a 5 [place] double lumen power injectable PICC was advanced to the lower SVC/right atrial junction. Fluoroscopy during the procedure and fluoro spot radiograph confirms appropriate catheter position. The catheter was flushed and covered with a sterile dressing. Catheter length: 35 cm IMPRESSION: Successful right arm Power PICC line placement with ultrasound and fluoroscopic guidance. The catheter is ready  for use. Read by: Rowe Robert, PA-C Electronically Signed   By: Aletta Edouard M.D.   On: 09/25/2015 17:05   [place] Mfm Fetal Bpp Wo Non Stress  09/22/2015  OBSTETRICAL ULTRASOUND: This exam was performed within a [place] [facility] Department. The OB US report was generated in the AS system, and faxed to the ordering physician.  This report is available in the [facility]. See the AS Obstetric US report via the Image Link.  [place] Mfm Ob Follow Up  09/22/2015  OBSTETRICAL ULTRASOUND: This exam was performed within a [facility] Department. The OB US report was generated in the AS system, and faxed to the ordering physician.  This report is available in the [facility]. See the AS Obstetric US report via the Image Link.   Microbiology: Recent Results (from the past 240 hour(s))  Wet prep, genital     Status: Abnormal   Collection Time: 09/27/15  2:10 AM  Result Value Ref Range Status   Yeast Wet Prep HPF POC NONE SEEN NONE SEEN Final   Trich, Wet Prep NONE SEEN NONE SEEN Final   Clue Cells Wet Prep HPF POC PRESENT (A) NONE SEEN Final   WBC, Wet Prep HPF POC MODERATE (A) NONE SEEN  Final   Sperm NO CHARGE  Final     Labs: Basic Metabolic Panel:  Recent Labs Lab 09/23/15 0952 09/27/15 0435  NA 137 138  K 3.8 4.0  CL 108 107  CO2 22 23  GLUCOSE 97 99  BUN <5* 6  CREATININE 0.37* 0.55  CALCIUM 8.7* 8.5*   Liver Function Tests:  Recent Labs Lab 09/23/15 0952 09/27/15 0435  AST 64* 33  ALT 115* 61*  ALKPHOS 100 103  BILITOT 3.1* 2.3*  PROT 6.3* 6.3*  ALBUMIN 2.8* 2.9*   No results for input(s): LIPASE, AMYLASE in the last 168 hours. No results for input(s): AMMONIA in the last 168 hours. CBC:  Recent Labs Lab 09/22/15 1046 09/22/15 2003 09/25/15 1057 09/27/15 0435  WBC 15.1* 15.1* 15.8* 14.8*  NEUTROABS 12684*  --  12.2* 11.3*  HGB 8.2* 7.9* 7.4* 8.3*  HCT 25.1* 22.8* 21.6* 23.9*  MCV 90.9 86.4 87.8 87.5  PLT 562* 590* 527* 473*   Time coordinating  discharge: 35 minutes  Signed:  Desere Gwin, [facility] 09/27/2015, 12:10 PM

## 2015-09-27 NOTE — Progress Notes (Signed)
Patient discharged to home, all discharge medications and instructions reviewed and questions answered.  Patient to be assisted to vehicle by wheelchair.  

## 2015-09-27 NOTE — Progress Notes (Signed)
RROB in to see pt due to large amounts of vaginal discharge, c/o pain in side and abdominal tightening. RROB collected a wet prep and amnisure and performed sve; wet prep sent; amnisure will be "run" at [facility]. Pt also reports some pain and swelling in anus; RROB checked and pt has 2 hemmerhoids. Pt reports no vaginal bleeding and positive fetal movement.

## 2015-09-27 NOTE — Progress Notes (Signed)
RROB spoke with Dr Ihor Dow, told of fhr reassurring, uc's 3-72min apart, pt says that it only feels like tightening; not painful. SVE 1/thick/ballotable, told wet prep results and of pt with hemerrhoids. Dr gave orders for flagyl 500mg  bid orally for 7 days and tucks pads. RROB told that will send amnisure when get back to [facility] and will contact Dr if positive.

## 2015-09-27 NOTE — Progress Notes (Signed)
Pt has complained of pain down the sides of her abdomen and tightness across the top of her abdomen tonight. She reported some increased discharge to me. When she stood up from the bed, there was a approximately 8 inch long, 3 inch wide light yellow wet stain with some creamy white discharge as well on the sheet. Pt reports when she wipes, she gets a lot of creamy white discharge. Called OB rapid response RN to inform her of the above and she will come shortly to assess the pt. Hortencia Conradi RN

## 2015-09-28 ENCOUNTER — Other Ambulatory Visit: Payer: Self-pay | Admitting: *Deleted

## 2015-09-28 LAB — TYPE AND SCREEN
ABO/RH(D): B POS
ANTIBODY SCREEN: NEGATIVE
DONOR AG TYPE: NEGATIVE
Donor AG Type: NEGATIVE
Unit division: 0
Unit division: 0

## 2015-09-28 NOTE — Patient Outreach (Signed)
Member recently discharged after being readmitted to hospital with sickle cell pain crisis complicated by pregnancy.  Call placed to restart transition of care program, no answer, HIPPA compliant voice message left.  Outreach letter sent out on 5/8 after unable to maintain contact prior to this most recent hospitalization.  Will continue to await response, if no response by 5/22, will close case.  Valente David, BSN, [place] [facility]  [facility] Care Manager [phone]

## 2015-09-29 ENCOUNTER — Encounter (HOSPITAL_COMMUNITY): Payer: Self-pay

## 2015-09-29 ENCOUNTER — Other Ambulatory Visit (HOSPITAL_COMMUNITY): Payer: Self-pay | Admitting: Obstetrics and Gynecology

## 2015-09-29 ENCOUNTER — Ambulatory Visit (HOSPITAL_COMMUNITY)
Admission: RE | Admit: 2015-09-29 | Discharge: 2015-09-29 | Disposition: A | Discharge status: Home / Self Care | Payer: Medicare Other | Source: Ambulatory Visit | Attending: Internal Medicine | Admitting: Internal Medicine

## 2015-09-29 VITALS — BP 110/47 | HR 109 | Wt 178.5 lb

## 2015-09-29 DIAGNOSIS — O0990 Supervision of high risk pregnancy, unspecified, unspecified trimester: Secondary | ICD-10-CM

## 2015-09-29 DIAGNOSIS — O98513 Other viral diseases complicating pregnancy, third trimester: Secondary | ICD-10-CM | POA: Insufficient documentation

## 2015-09-29 DIAGNOSIS — O0933 Supervision of pregnancy with insufficient antenatal care, third trimester: Secondary | ICD-10-CM | POA: Diagnosis not present

## 2015-09-29 DIAGNOSIS — B009 Herpesviral infection, unspecified: Secondary | ICD-10-CM | POA: Diagnosis not present

## 2015-09-29 DIAGNOSIS — O99013 Anemia complicating pregnancy, third trimester: Secondary | ICD-10-CM

## 2015-09-29 DIAGNOSIS — D571 Sickle-cell disease without crisis: Secondary | ICD-10-CM | POA: Diagnosis not present

## 2015-09-29 DIAGNOSIS — F1110 Opioid abuse, uncomplicated: Secondary | ICD-10-CM | POA: Insufficient documentation

## 2015-09-29 DIAGNOSIS — O99323 Drug use complicating pregnancy, third trimester: Secondary | ICD-10-CM

## 2015-09-29 DIAGNOSIS — Z3A33 33 weeks gestation of pregnancy: Secondary | ICD-10-CM | POA: Diagnosis not present

## 2015-09-29 DIAGNOSIS — IMO0002 Reserved for concepts with insufficient information to code with codable children: Secondary | ICD-10-CM

## 2015-09-29 DIAGNOSIS — O99019 Anemia complicating pregnancy, unspecified trimester: Secondary | ICD-10-CM

## 2015-09-29 DIAGNOSIS — O98519 Other viral diseases complicating pregnancy, unspecified trimester: Secondary | ICD-10-CM

## 2015-09-29 DIAGNOSIS — O99320 Drug use complicating pregnancy, unspecified trimester: Secondary | ICD-10-CM

## 2015-09-29 DIAGNOSIS — D5700 Hb-SS disease with crisis, unspecified: Secondary | ICD-10-CM

## 2015-10-03 ENCOUNTER — Encounter (HOSPITAL_COMMUNITY): Payer: Self-pay | Admitting: Emergency Medicine

## 2015-10-03 ENCOUNTER — Emergency Department (HOSPITAL_COMMUNITY)
Admission: EM | Admit: 2015-10-03 | Discharge: 2015-10-04 | Disposition: A | Discharge status: Home / Self Care | Payer: Medicare Other | Source: Home / Self Care | Attending: Emergency Medicine | Admitting: Emergency Medicine

## 2015-10-03 DIAGNOSIS — Z3A35 35 weeks gestation of pregnancy: Secondary | ICD-10-CM

## 2015-10-03 DIAGNOSIS — Z87891 Personal history of nicotine dependence: Secondary | ICD-10-CM

## 2015-10-03 DIAGNOSIS — Z91040 Latex allergy status: Secondary | ICD-10-CM

## 2015-10-03 DIAGNOSIS — D57219 Sickle-cell/Hb-C disease with crisis, unspecified: Secondary | ICD-10-CM | POA: Diagnosis not present

## 2015-10-03 DIAGNOSIS — D5700 Hb-SS disease with crisis, unspecified: Secondary | ICD-10-CM | POA: Diagnosis present

## 2015-10-03 DIAGNOSIS — F329 Major depressive disorder, single episode, unspecified: Secondary | ICD-10-CM | POA: Insufficient documentation

## 2015-10-03 DIAGNOSIS — Z3A34 34 weeks gestation of pregnancy: Secondary | ICD-10-CM | POA: Diagnosis not present

## 2015-10-03 DIAGNOSIS — N858 Other specified noninflammatory disorders of uterus: Secondary | ICD-10-CM | POA: Diagnosis not present

## 2015-10-03 DIAGNOSIS — O99013 Anemia complicating pregnancy, third trimester: Principal | ICD-10-CM | POA: Diagnosis present

## 2015-10-03 DIAGNOSIS — O9989 Other specified diseases and conditions complicating pregnancy, childbirth and the puerperium: Secondary | ICD-10-CM | POA: Diagnosis not present

## 2015-10-03 DIAGNOSIS — F1120 Opioid dependence, uncomplicated: Secondary | ICD-10-CM | POA: Diagnosis not present

## 2015-10-03 DIAGNOSIS — O479 False labor, unspecified: Secondary | ICD-10-CM

## 2015-10-03 DIAGNOSIS — D72829 Elevated white blood cell count, unspecified: Secondary | ICD-10-CM | POA: Diagnosis not present

## 2015-10-03 LAB — COMPREHENSIVE METABOLIC PANEL
ALBUMIN: 3.3 g/dL — AB (ref 3.5–5.0)
ALT: 62 U/L — AB (ref 14–54)
AST: 42 U/L — AB (ref 15–41)
Alkaline Phosphatase: 112 U/L (ref 38–126)
Anion gap: 6 (ref 5–15)
BILIRUBIN TOTAL: 2 mg/dL — AB (ref 0.3–1.2)
BUN: 6 mg/dL (ref 6–20)
CHLORIDE: 112 mmol/L — AB (ref 101–111)
CO2: 21 mmol/L — ABNORMAL LOW (ref 22–32)
CREATININE: 0.38 mg/dL — AB (ref 0.44–1.00)
Calcium: 8.8 mg/dL — ABNORMAL LOW (ref 8.9–10.3)
GFR calc Af Amer: >60 mL/min (ref >60)
GLUCOSE: 94 mg/dL (ref 65–99)
Potassium: 3.7 mmol/L (ref 3.5–5.1)
Sodium: 139 mmol/L (ref 135–145)
TOTAL PROTEIN: 6.4 g/dL — AB (ref 6.5–8.1)

## 2015-10-03 LAB — CBC WITH DIFFERENTIAL/PLATELET
Basophils Absolute: 0 10*3/uL (ref 0.0–0.1)
Basophils Relative: 0 %
Eosinophils Absolute: 0.2 10*3/uL (ref 0.0–0.7)
Eosinophils Relative: 2 %
HEMATOCRIT: 25.1 % — AB (ref 36.0–46.0)
HEMOGLOBIN: 8.7 g/dL — AB (ref 12.0–15.0)
LYMPHS ABS: 2.1 10*3/uL (ref 0.7–4.0)
LYMPHS PCT: 15 %
MCH: 29.2 pg (ref 26.0–34.0)
MCHC: 34.7 g/dL (ref 30.0–36.0)
MCV: 84.2 fL (ref 78.0–100.0)
MONO ABS: 1.4 10*3/uL — AB (ref 0.1–1.0)
MONOS PCT: 9 %
NEUTROS ABS: 10.8 10*3/uL — AB (ref 1.7–7.7)
Neutrophils Relative %: 74 %
Platelets: 490 10*3/uL — ABNORMAL HIGH (ref 150–400)
RBC: 2.98 MIL/uL — ABNORMAL LOW (ref 3.87–5.11)
RDW: 18.9 % — AB (ref 11.5–15.5)
WBC: 14.6 10*3/uL — ABNORMAL HIGH (ref 4.0–10.5)

## 2015-10-03 LAB — RETICULOCYTES
RBC.: 2.98 MIL/uL — ABNORMAL LOW (ref 3.87–5.11)
Retic Count, Absolute: 390.4 10*3/uL — ABNORMAL HIGH (ref 19.0–186.0)
Retic Ct Pct: 13.1 % — ABNORMAL HIGH (ref 0.4–3.1)

## 2015-10-03 MED ORDER — ONDANSETRON HCL 4 MG/2ML IJ SOLN
4.0000 mg | Freq: Once | INTRAMUSCULAR | Status: AC
  Administered 2015-10-03: 4 mg via INTRAVENOUS
  Filled 2015-10-03: qty 2

## 2015-10-03 MED ORDER — DIPHENHYDRAMINE HCL 25 MG PO CAPS
50.0000 mg | ORAL_CAPSULE | Freq: Once | ORAL | Status: AC
  Administered 2015-10-03: 50 mg via ORAL
  Filled 2015-10-03: qty 2

## 2015-10-03 MED ORDER — HYDROMORPHONE HCL 2 MG/ML IJ SOLN
2.0000 mg | INTRAMUSCULAR | Status: AC | PRN
  Administered 2015-10-03 – 2015-10-04 (×3): 2 mg via INTRAVENOUS
  Filled 2015-10-03 (×3): qty 1

## 2015-10-03 MED ORDER — SODIUM CHLORIDE 0.45 % IV SOLN
INTRAVENOUS | Status: DC
  Administered 2015-10-03: 22:00:00 via INTRAVENOUS

## 2015-10-03 NOTE — ED Notes (Signed)
Informed the pt that a urine specimen is needed. 

## 2015-10-03 NOTE — Discharge Instructions (Signed)
Follow-up closely with your [facility] physician. You are not in active labor today.  Follow closely with your sickle cell physician as well.  Return for worsening symptoms, including fever, vaginal bleeding or leakage of fluids, worsening pain, or any other symptoms concerning to you.  Sickle Cell Anemia, Adult Sickle cell anemia is a condition where your red blood cells are shaped like sickles. Red blood cells carry oxygen through the body. Sickle-shaped red blood cells do not live as long as normal red blood cells. They also clump together and block blood from flowing through the blood vessels. These things prevent the body from getting enough oxygen. Sickle cell anemia causes organ damage and pain. It also increases the risk of infection. HOME CARE  Drink enough fluid to keep your pee (urine) clear or pale yellow. Drink more in hot weather and during exercise.  Do not smoke. Smoking lowers oxygen levels in the blood.  Only take over-the-counter or prescription medicines as told by your doctor.  Take antibiotic medicines as told by your doctor. Make sure you finish them even if you start to feel better.  Take supplements as told by your doctor.  Consider wearing a medical alert bracelet. This tells anyone caring for you in an emergency of your condition.  When traveling, keep your medical information, doctors' names, and the medicines you take with you at all times.  If you have a fever, do not take fever medicines right away. This could cover up a problem. Tell your doctor.   Keep all follow-up visits with your doctor. Sickle cell anemia requires regular medical care. GET HELP IF: You have a fever. GET HELP RIGHT AWAY IF:  You feel dizzy or faint.  You have new belly (abdominal) pain, especially on the left side near the stomach area.  You have a lasting, often uncomfortable and painful erection of the penis (priapism). If it is not treated right away, you will become unable to have  sex (impotence).  You have numbness in your arms or legs or you have a hard time moving them.  You have a hard time talking.  You have a fever or lasting symptoms for more than 2-3 days.  You have a fever and your symptoms suddenly get worse.  You have signs or symptoms of infection. These include:  Chills.  Being more tired than normal (lethargy).  Irritability.  Poor eating.  Throwing up (vomiting).  You have pain that is not helped with medicine.  You have shortness of breath.  You have pain in your chest.  You are coughing up pus-like or bloody mucus.  You have a stiff neck.  Your feet or hands swell or have pain.  Your belly looks bloated.  Your joints hurt. MAKE SURE YOU:  Understand these instructions.  Will watch your condition.  Will get help right away if you are not doing well or get worse.   This information is not intended to replace advice given to you by your health care provider. Make sure you discuss any questions you have with your health care provider.   Document Released: 02/20/2013 Document Revised: 09/16/2014 Document Reviewed: 02/20/2013 Elsevier Interactive Patient Education [employer].

## 2015-10-03 NOTE — ED Notes (Signed)
This note also relates to the following rows which could not be included: Pulse Rate - Cannot attach notes to unvalidated device data SpO2 - Cannot attach notes to unvalidated device data   RROB spoke with Dr Harolyn Rutherford about pt's signs and symptoms, fhr, uc's, pain level; pt OB cleared.

## 2015-10-03 NOTE — ED Provider Notes (Signed)
CSN: [id number]     Arrival date & time 10/03/15  2009 History   First MD Initiated Contact with Patient 10/03/15 2045     Chief Complaint  Patient presents with  . Contractions  . Sickle Cell Pain Crisis     (Consider location/radiation/quality/duration/timing/severity/associated sxs/prior Treatment) HPI 25 year old female with history of sickle cell disease who presents with abdominal contractions and back pain. She is G3 P1 at [redacted] weeks gestational age. I was recently admitted to the hospital in April for abdominal pain, contractions, and pain crisis. States that she has been doing well, and this afternoon began to have recurrent contractions, irregular in nature. Denies any vaginal bleeding, leakage of fluid. Has passed small amount of mucus, and states that she has been taking Flagyl for recent diagnosis of BV. Denies nausea, vomiting, diarrhea, fevers, chills, dysuria or urinary frequency. States that with these abdominal contractions, she has been having back pain, which is typical for her pain crises. No abdominal pain outside of contractions. No numbness or weakness, bowel or urinary incontinence. Past Medical History  Diagnosis Date  . Miscarriage 03/22/2011    Pt reports 2 miscarriages.  . Depression 01/06/2011  . GERD (gastroesophageal reflux disease) 02/17/2011  . Trichotillomania     h/o  . Blood transfusion     "lots"  . Sickle cell anemia with crisis ([place])   . Exertional dyspnea     "sometimes"  . Migraines 11/08/11    "@ least twice/month"  . Chronic back pain     "very severe; have knot in my back; from tight muscle; take RX and exercise for it"  . Mood swings ([place]) 11/08/11    "I go back and forth; real bad"  . Sickle cell anemia ([name])   . Blood transfusion without reported diagnosis   . Genital HSV    Past Surgical History  Procedure Laterality Date  . Cholecystectomy  05/2010  . Dilation and curettage of uterus  02/20/11    S/P miscarriage   Family History   Problem Relation Age of Onset  . Diabetes Maternal Grandmother   . Diabetes Paternal Grandmother   . Hypertension Paternal Grandmother   . Diabetes Maternal Grandfather   . Sickle cell trait Mother   . Sickle cell trait Father    Social History  Substance Use Topics  . Smoking status: Former Smoker -- 0.25 packs/day for 1 years    Types: Cigarettes    Quit date: 03/25/2013  . Smokeless tobacco: Never Used  . Alcohol Use: No     Comment: pt states she quit marijuan in May 2013. Rare ETOH, + cigarettes.  She is enrolled in school   OB History    Gravida Para Term Preterm AB TAB SAB Ectopic Multiple Living   3 1 1  1  1   0 1      Obstetric Comments   Miscarried in October 2012 at about 7 weeks     Review of Systems 10/14 systems reviewed and are negative other than those stated in the HPI    Allergies  Carrot; Carrot oil; and Latex  Home Medications   Prior to Admission medications   Medication Sig Start Date End Date Taking? Authorizing Provider  calcium carbonate (TUMS - DOSED IN MG ELEMENTAL CALCIUM) 500 MG chewable tablet Chew 2 tablets by mouth 2 (two) times daily as needed for indigestion or heartburn.   Yes Historical Provider, MD  folic acid (FOLVITE) 1 MG tablet Take 1 mg by mouth daily.  Yes Historical Provider, MD  methadone (DOLOPHINE) 5 MG tablet Take 1 tablet (5 mg total) by mouth at bedtime. 09/01/15  Yes Tresa Garter, MD  metroNIDAZOLE (FLAGYL) 500 MG tablet Take 1 tablet (500 mg total) by mouth 2 (two) times daily. 09/27/15 10/04/15 Yes Olugbemiga Essie Christine, MD  oxyCODONE (ROXICODONE) 15 MG immediate release tablet Take 1 tablet (15 mg total) by mouth every 4 (four) hours as needed for pain. 09/27/15  Yes Tresa Garter, MD  Prenatal Vit-Fe Fumarate-FA (PRENATAL MULTIVITAMIN) TABS tablet Take 1 tablet by mouth daily at 12 noon.   Yes Historical Provider, MD   BP 100/83 mmHg  Pulse 99  Temp(Src) 98.6 F (37 C) (Oral)  Resp 16  SpO2 95%  LMP  02/09/2015 Physical Exam Physical Exam  Nursing note and vitals reviewed. Constitutional: Well developed, well nourished, non-toxic, and in no acute distress Head: Normocephalic and atraumatic.  Mouth/Throat: Oropharynx is clear and moist.  Neck: Normal range of motion. Neck supple.  Cardiovascular: Tachcyardic rate and regular rhythm.   Pulmonary/Chest: Effort normal and breath sounds normal.  Abdominal: Soft. Gravid. There is no tenderness. There is no rebound and no guarding.  Musculoskeletal: Normal range of motion. No edema. Neurological: Alert, no facial droop, fluent speech, moves all extremities symmetrically Skin: Skin is warm and dry.  Psychiatric: Cooperative  ED Course  Procedures (including critical care time) Labs Review Labs Reviewed  CBC WITH DIFFERENTIAL/PLATELET - Abnormal; Notable for the following:    WBC 14.6 (*)    RBC 2.98 (*)    Hemoglobin 8.7 (*)    HCT 25.1 (*)    RDW 18.9 (*)    Platelets 490 (*)    Neutro Abs 10.8 (*)    Monocytes Absolute 1.4 (*)    All other components within normal limits  RETICULOCYTES - Abnormal; Notable for the following:    Retic Ct Pct 13.1 (*)    RBC. 2.98 (*)    Retic Count, Manual 390.4 (*)    All other components within normal limits  COMPREHENSIVE METABOLIC PANEL - Abnormal; Notable for the following:    Chloride 112 (*)    CO2 21 (*)    Creatinine, Ser 0.38 (*)    Calcium 8.8 (*)    Total Protein 6.4 (*)    Albumin 3.3 (*)    AST 42 (*)    ALT 62 (*)    Total Bilirubin 2.0 (*)    All other components within normal limits  URINALYSIS, ROUTINE W REFLEX MICROSCOPIC (NOT AT [facility])    Imaging Review No results found. I have personally reviewed and evaluated these images and lab results as part of my medical decision-making.   EKG Interpretation None      MDM   Final diagnoses:  Sickle cell pain crisis ([name])  Irregular uterine contractions   25 year old female at [redacted] weeks GA who presents with  contractions and back pain c/w crisis. OB rapid RN evaluated patient on tocomotor. Having irregular occasional contractions but no signs of active labor. FHR 150s. On her exam, cervix 1/long. Cleared from OB standpoint. Treated for typical sickle cell crisis. Baseline anemia noted with appropriate reticulocytosis. Chronic leukocytosis noted, but no suspicion for acute infection at this time.  Given IV dilaudid, PO benadryl, 1/2 NS and will re-assess. Anticipate discharge if pain controlled.     Forde Dandy, MD 10/04/15 [phone]

## 2015-10-03 NOTE — ED Notes (Addendum)
Pt states that she was seen here on Wed for contractions and for SSC. Pt states that she goes into sickle cell pain when she has contractions. Pt currently is experiencing lower abdominal pain and a sensation to have a bowel movement. Pt is [redacted] weeks pregnant with her second child. Pt states she was induced last pregnancy. Although pt has been experiencing contractions since Wed, they are worse now and are about 4-5 min apart. Pt states she currently has pain in her back as well  Rapid OB has been called

## 2015-10-03 NOTE — ED Notes (Signed)
OB nurse at bedside

## 2015-10-04 DIAGNOSIS — O99013 Anemia complicating pregnancy, third trimester: Secondary | ICD-10-CM | POA: Diagnosis not present

## 2015-10-04 NOTE — ED Provider Notes (Signed)
Pt stable She has been cleared from [facility] standpoint She feels improved Will d/c home   Ripley Fraise, MD 10/04/15 [phone]

## 2015-10-05 ENCOUNTER — Telehealth: Payer: Self-pay

## 2015-10-05 ENCOUNTER — Encounter: Payer: Self-pay | Admitting: *Deleted

## 2015-10-05 ENCOUNTER — Other Ambulatory Visit: Payer: Self-pay | Admitting: *Deleted

## 2015-10-05 NOTE — Telephone Encounter (Signed)
Pt is requesting a medication refill for her Methodone and Oxycodone. Thanks!

## 2015-10-05 NOTE — Patient Outreach (Signed)
Outreach letter sent on 5/8, no response, will close case at this time.  Will send PCP and member case closure letter, will notify care management assistant of case closure.  Valente David, BSN, [facility]  [facility] Care Manager [phone]

## 2015-10-05 NOTE — Telephone Encounter (Signed)
Refill request for Methadone and Oyxcodone LOV 09/22/2015. Please advise. Thanks!

## 2015-10-06 ENCOUNTER — Encounter (HOSPITAL_COMMUNITY): Payer: Self-pay | Admitting: Emergency Medicine

## 2015-10-06 ENCOUNTER — Inpatient Hospital Stay (HOSPITAL_COMMUNITY)
Admission: EM | Admit: 2015-10-06 | Discharge: 2015-10-10 | DRG: 781 | Disposition: A | Discharge status: Home / Self Care | Payer: Medicare Other | Attending: Internal Medicine | Admitting: Internal Medicine

## 2015-10-06 ENCOUNTER — Ambulatory Visit: Payer: Medicare Other | Admitting: Internal Medicine

## 2015-10-06 ENCOUNTER — Encounter (HOSPITAL_COMMUNITY): Payer: Self-pay

## 2015-10-06 ENCOUNTER — Other Ambulatory Visit (HOSPITAL_COMMUNITY): Payer: Self-pay | Admitting: Obstetrics and Gynecology

## 2015-10-06 ENCOUNTER — Ambulatory Visit (HOSPITAL_COMMUNITY)
Admission: RE | Admit: 2015-10-06 | Discharge: 2015-10-06 | Disposition: A | Discharge status: Home / Self Care | Payer: Medicare Other | Source: Ambulatory Visit | Attending: Internal Medicine | Admitting: Internal Medicine

## 2015-10-06 DIAGNOSIS — O99323 Drug use complicating pregnancy, third trimester: Secondary | ICD-10-CM | POA: Diagnosis not present

## 2015-10-06 DIAGNOSIS — O99013 Anemia complicating pregnancy, third trimester: Secondary | ICD-10-CM

## 2015-10-06 DIAGNOSIS — B009 Herpesviral infection, unspecified: Secondary | ICD-10-CM | POA: Diagnosis present

## 2015-10-06 DIAGNOSIS — Z3A34 34 weeks gestation of pregnancy: Secondary | ICD-10-CM | POA: Diagnosis not present

## 2015-10-06 DIAGNOSIS — D571 Sickle-cell disease without crisis: Secondary | ICD-10-CM

## 2015-10-06 DIAGNOSIS — O0933 Supervision of pregnancy with insufficient antenatal care, third trimester: Secondary | ICD-10-CM

## 2015-10-06 DIAGNOSIS — D5700 Hb-SS disease with crisis, unspecified: Secondary | ICD-10-CM

## 2015-10-06 DIAGNOSIS — O99320 Drug use complicating pregnancy, unspecified trimester: Secondary | ICD-10-CM

## 2015-10-06 DIAGNOSIS — D57219 Sickle-cell/Hb-C disease with crisis, unspecified: Secondary | ICD-10-CM | POA: Diagnosis not present

## 2015-10-06 DIAGNOSIS — F1920 Other psychoactive substance dependence, uncomplicated: Secondary | ICD-10-CM

## 2015-10-06 DIAGNOSIS — R52 Pain, unspecified: Secondary | ICD-10-CM

## 2015-10-06 DIAGNOSIS — O0990 Supervision of high risk pregnancy, unspecified, unspecified trimester: Secondary | ICD-10-CM

## 2015-10-06 DIAGNOSIS — O99019 Anemia complicating pregnancy, unspecified trimester: Secondary | ICD-10-CM

## 2015-10-06 DIAGNOSIS — O98519 Other viral diseases complicating pregnancy, unspecified trimester: Secondary | ICD-10-CM

## 2015-10-06 DIAGNOSIS — D72829 Elevated white blood cell count, unspecified: Secondary | ICD-10-CM | POA: Diagnosis present

## 2015-10-06 DIAGNOSIS — F1120 Opioid dependence, uncomplicated: Secondary | ICD-10-CM

## 2015-10-06 DIAGNOSIS — IMO0002 Reserved for concepts with insufficient information to code with codable children: Secondary | ICD-10-CM

## 2015-10-06 DIAGNOSIS — Z3490 Encounter for supervision of normal pregnancy, unspecified, unspecified trimester: Secondary | ICD-10-CM

## 2015-10-06 LAB — COMPREHENSIVE METABOLIC PANEL
ALT: 59 U/L — AB (ref 14–54)
AST: 40 U/L (ref 15–41)
Albumin: 3.5 g/dL (ref 3.5–5.0)
Alkaline Phosphatase: 108 U/L (ref 38–126)
Anion gap: 7 (ref 5–15)
CHLORIDE: 107 mmol/L (ref 101–111)
CO2: 21 mmol/L — AB (ref 22–32)
CREATININE: 0.41 mg/dL — AB (ref 0.44–1.00)
Calcium: 8.7 mg/dL — ABNORMAL LOW (ref 8.9–10.3)
GFR calc Af Amer: >60 mL/min (ref >60)
GLUCOSE: 72 mg/dL (ref 65–99)
Potassium: 3.7 mmol/L (ref 3.5–5.1)
SODIUM: 135 mmol/L (ref 135–145)
Total Bilirubin: 3.1 mg/dL — ABNORMAL HIGH (ref 0.3–1.2)
Total Protein: 7.1 g/dL (ref 6.5–8.1)

## 2015-10-06 LAB — CBC WITH DIFFERENTIAL/PLATELET
Basophils Absolute: 0 10*3/uL (ref 0.0–0.1)
Basophils Relative: 0 %
EOS ABS: 0.1 10*3/uL (ref 0.0–0.7)
EOS PCT: 1 %
HCT: 27.1 % — ABNORMAL LOW (ref 36.0–46.0)
Hemoglobin: 9.5 g/dL — ABNORMAL LOW (ref 12.0–15.0)
LYMPHS ABS: 2 10*3/uL (ref 0.7–4.0)
Lymphocytes Relative: 13 %
MCH: 29.7 pg (ref 26.0–34.0)
MCHC: 35.1 g/dL (ref 30.0–36.0)
MCV: 84.7 fL (ref 78.0–100.0)
MONO ABS: 1 10*3/uL (ref 0.1–1.0)
MONOS PCT: 6 %
Neutro Abs: 12.2 10*3/uL — ABNORMAL HIGH (ref 1.7–7.7)
Neutrophils Relative %: 80 %
PLATELETS: 481 10*3/uL — AB (ref 150–400)
RBC: 3.2 MIL/uL — ABNORMAL LOW (ref 3.87–5.11)
RDW: 19.3 % — ABNORMAL HIGH (ref 11.5–15.5)
WBC: 15.2 10*3/uL — ABNORMAL HIGH (ref 4.0–10.5)

## 2015-10-06 LAB — RETICULOCYTES
RBC.: 3.21 MIL/uL — ABNORMAL LOW (ref 3.87–5.11)
RETIC CT PCT: 14.3 % — AB (ref 0.4–3.1)
Retic Count, Absolute: 459 10*3/uL — ABNORMAL HIGH (ref 19.0–186.0)

## 2015-10-06 LAB — LACTATE DEHYDROGENASE: LDH: 183 U/L (ref 98–192)

## 2015-10-06 MED ORDER — SODIUM CHLORIDE 0.45 % IV SOLN
INTRAVENOUS | Status: DC
  Administered 2015-10-06 – 2015-10-09 (×8): via INTRAVENOUS
  Administered 2015-10-10: 1000 mL via INTRAVENOUS

## 2015-10-06 MED ORDER — HYDROMORPHONE HCL 2 MG/ML IJ SOLN
0.0250 mg/kg | INTRAMUSCULAR | Status: AC
  Administered 2015-10-06: 2 mg via INTRAVENOUS
  Filled 2015-10-06: qty 1

## 2015-10-06 MED ORDER — ONDANSETRON HCL 4 MG/2ML IJ SOLN: 4.0000 mg | Freq: Four times a day (QID) | INTRAMUSCULAR | Status: DC | PRN

## 2015-10-06 MED ORDER — ACETAMINOPHEN 325 MG PO TABS: 650.0000 mg | ORAL_TABLET | Freq: Four times a day (QID) | ORAL | Status: DC | PRN

## 2015-10-06 MED ORDER — SODIUM CHLORIDE 0.9% FLUSH
9.0000 mL | INTRAVENOUS | Status: DC | PRN
  Administered 2015-10-07: 3 mL via INTRAVENOUS
  Filled 2015-10-06: qty 9

## 2015-10-06 MED ORDER — NALOXONE HCL 0.4 MG/ML IJ SOLN: 0.4000 mg | INTRAMUSCULAR | Status: DC | PRN

## 2015-10-06 MED ORDER — HYDROMORPHONE HCL 2 MG/ML IJ SOLN: 0.0375 mg/kg | INTRAMUSCULAR | Status: DC

## 2015-10-06 MED ORDER — HYDROMORPHONE HCL 2 MG/ML IJ SOLN: 0.0250 mg/kg | INTRAMUSCULAR | Status: AC

## 2015-10-06 MED ORDER — HYDROMORPHONE HCL 2 MG/ML IJ SOLN
0.0313 mg/kg | INTRAMUSCULAR | Status: AC
  Administered 2015-10-06: 2.5 mg via INTRAVENOUS
  Filled 2015-10-06: qty 2

## 2015-10-06 MED ORDER — OXYCODONE HCL 5 MG PO TABS: 15.0000 mg | ORAL_TABLET | ORAL | Status: DC | PRN

## 2015-10-06 MED ORDER — ONDANSETRON HCL 4 MG PO TABS: 4.0000 mg | ORAL_TABLET | Freq: Four times a day (QID) | ORAL | Status: DC | PRN

## 2015-10-06 MED ORDER — FOLIC ACID 1 MG PO TABS
1.0000 mg | ORAL_TABLET | Freq: Every day | ORAL | Status: DC
  Administered 2015-10-07 – 2015-10-10 (×4): 1 mg via ORAL
  Filled 2015-10-06 (×4): qty 1

## 2015-10-06 MED ORDER — ACETAMINOPHEN 650 MG RE SUPP: 650.0000 mg | Freq: Four times a day (QID) | RECTAL | Status: DC | PRN

## 2015-10-06 MED ORDER — HYDROMORPHONE HCL 2 MG/ML IJ SOLN
0.0313 mg/kg | INTRAMUSCULAR | Status: AC
  Filled 2015-10-06: qty 2

## 2015-10-06 MED ORDER — HYDROMORPHONE HCL 2 MG/ML IJ SOLN
0.0375 mg/kg | INTRAMUSCULAR | Status: AC
  Administered 2015-10-06: 3 mg via INTRAVENOUS
  Filled 2015-10-06: qty 2

## 2015-10-06 MED ORDER — HYDROMORPHONE HCL 2 MG/ML IJ SOLN
0.0375 mg/kg | INTRAMUSCULAR | Status: DC
  Administered 2015-10-06: 3 mg via INTRAVENOUS
  Filled 2015-10-06: qty 2

## 2015-10-06 MED ORDER — PRENATAL MULTIVITAMIN CH
1.0000 | ORAL_TABLET | Freq: Every day | ORAL | Status: DC
  Administered 2015-10-07 – 2015-10-10 (×4): 1 via ORAL
  Filled 2015-10-06 (×4): qty 1

## 2015-10-06 MED ORDER — HYDROMORPHONE 1 MG/ML IV SOLN
INTRAVENOUS | Status: DC
  Administered 2015-10-06: via INTRAVENOUS
  Administered 2015-10-07: 4.9 mg via INTRAVENOUS
  Administered 2015-10-07 (×2): 4.8 mg via INTRAVENOUS
  Administered 2015-10-07: 11.4 mg via INTRAVENOUS
  Administered 2015-10-07: 16:00:00 via INTRAVENOUS
  Administered 2015-10-07 (×2): 5.4 mg via INTRAVENOUS
  Administered 2015-10-08: 7.6 mg via INTRAVENOUS
  Administered 2015-10-08: 4.8 mg via INTRAVENOUS
  Administered 2015-10-08: 7.8 mg via INTRAVENOUS
  Administered 2015-10-08: 3.6 mg via INTRAVENOUS
  Administered 2015-10-08: 2.4 mg via INTRAVENOUS
  Administered 2015-10-09: 05:00:00 via INTRAVENOUS
  Administered 2015-10-09: 4.8 mg via INTRAVENOUS
  Administered 2015-10-09: 2.38 mg via INTRAVENOUS
  Administered 2015-10-09: 1.8 mg via INTRAVENOUS
  Administered 2015-10-09: 7.2 mg via INTRAVENOUS
  Administered 2015-10-09: 7.8 mg via INTRAVENOUS
  Administered 2015-10-10: 1.8 mg via INTRAVENOUS
  Administered 2015-10-10: via INTRAVENOUS
  Administered 2015-10-10: 10.2 mg via INTRAVENOUS
  Administered 2015-10-10: 4.8 mg via INTRAVENOUS
  Administered 2015-10-10: 7.64 mg via INTRAVENOUS
  Filled 2015-10-06 (×5): qty 25

## 2015-10-06 MED ORDER — HYDROMORPHONE HCL 2 MG/ML IJ SOLN: 0.0375 mg/kg | INTRAMUSCULAR | Status: AC

## 2015-10-06 MED ORDER — DIPHENHYDRAMINE HCL 25 MG PO CAPS: 25.0000 mg | ORAL_CAPSULE | Freq: Once | ORAL | Status: DC

## 2015-10-06 MED ORDER — METHADONE HCL 5 MG PO TABS
5.0000 mg | ORAL_TABLET | Freq: Every day | ORAL | Status: DC
  Administered 2015-10-06 – 2015-10-09 (×4): 5 mg via ORAL
  Filled 2015-10-06 (×4): qty 1

## 2015-10-06 MED ORDER — CALCIUM CARBONATE ANTACID 500 MG PO CHEW: 2.0000 | CHEWABLE_TABLET | Freq: Two times a day (BID) | ORAL | Status: DC | PRN

## 2015-10-06 MED ORDER — ONDANSETRON HCL 4 MG/2ML IJ SOLN
4.0000 mg | Freq: Once | INTRAMUSCULAR | Status: AC
  Administered 2015-10-06: 4 mg via INTRAVENOUS
  Filled 2015-10-06: qty 2

## 2015-10-06 MED ORDER — DIPHENHYDRAMINE HCL 25 MG PO CAPS
25.0000 mg | ORAL_CAPSULE | ORAL | Status: DC | PRN
  Administered 2015-10-06 (×2): 25 mg via ORAL
  Administered 2015-10-07 – 2015-10-10 (×6): 50 mg via ORAL
  Filled 2015-10-06: qty 1
  Filled 2015-10-06: qty 2
  Filled 2015-10-06: qty 1
  Filled 2015-10-06 (×5): qty 2

## 2015-10-06 NOTE — ED Notes (Signed)
Hospitalist at bedside 

## 2015-10-06 NOTE — ED Notes (Signed)
Spoke with rapid response OB nurse who is currently in another delivery, will be coming over shortly.

## 2015-10-06 NOTE — ED Notes (Signed)
Requested another nurse check on patient as I was in an emergency situation and unable to get into room.

## 2015-10-06 NOTE — ED Notes (Signed)
Have been attempting ultrasound IV since 16:30. Per patient request, I attempted 3 times, all unsuccessful. Notified another ultrasound IV certified nurse and the MD, next ultrasound nurse attempting currently

## 2015-10-06 NOTE — Progress Notes (Signed)
Dr Kennon Rounds notified of patient's arrival, complaints, and NST and BPP results; OB cleared at this time; orders given for daily NSTs while inpatient; ED provider notified

## 2015-10-06 NOTE — H&P (Signed)
History and Physical    [name] [id number] DOB: 08-23-1990 DOA: 10/06/2015  PCP: Angelica Chessman, MD  Patient coming from: Home  Chief Complaint: Intractable pain attributed to her sickle cell disease  HPI: HELIA HOUSEKNECHT is a 25 y.o. woman with a history of Hemoglobin SS disease who is [redacted] weeks pregnant with her second child.  She has had recurrent admissions this pregnancy for management of sickle cell pain crisis.  She feels that she has had increased pain during this pregnancy.  She feels that her symptoms were exacerbated this weekend by contractions and being outside in the heat for prolonged periods of time while attending a cookout.  She reports that she has been hydrating aggressively during this pregnancy.  She has been using her home doses of oxycodone and methadone per routine, as well as a heating pad for adjunct therapy.  She feels that her pain level has escalated in the past 24 hours, and she presented to the ED today after her OB U/S appointment for IV analgesics and admission.  Pain is intermittent, sometimes sharp then achy in nature.  It radiates from her left shoulder, down her back.  She feels that this is the typical location for her pain when she is having a crisis.  No headache.  Chronic N/V, unchanged from baseline.  No chest pain or shortness of breath.  ED Course: Patient still complaining of uncontrolled back pain after several doses of IV dilaudid.  Hospitalist asked to admit for observation for pain management.  Review of Systems: As per HPI otherwise 10 point review of systems negative.   Past Medical History  Diagnosis Date  . Miscarriage 03/22/2011    Pt reports 2 miscarriages.  . Depression 01/06/2011  . GERD (gastroesophageal reflux disease) 02/17/2011  . Trichotillomania     h/o  . Blood transfusion     "lots"  . Sickle cell anemia with crisis ([place])   . Exertional dyspnea     "sometimes"  . Migraines 11/08/11    "@ least twice/month"   . Chronic back pain     "very severe; have knot in my back; from tight muscle; take RX and exercise for it"  . Mood swings ([place]) 11/08/11    "I go back and forth; real bad"  . Sickle cell anemia ([name])   . Blood transfusion without reported diagnosis   . Genital HSV     Past Surgical History  Procedure Laterality Date  . Cholecystectomy  05/2010  . Dilation and curettage of uterus  02/20/11    S/P miscarriage     reports that she quit smoking about 2 years ago. Her smoking use included Cigarettes. She has a .25 pack-year smoking history. She has never used smokeless tobacco. She reports that she does not drink alcohol or use illicit drugs.  She is a Ship broker.  She has a 48 month old son.  Allergies  Allergen Reactions  . Carrot [Daucus Carota] Hives, Swelling and Rash    Dietary:  Please do not send any form of carrot to patient  . Carrot Oil Hives and Swelling  . Latex Rash    *powdered Latex*    Family History  Problem Relation Age of Onset  . Diabetes Maternal Grandmother   . Diabetes Paternal Grandmother   . Hypertension Paternal Grandmother   . Diabetes Maternal Grandfather   . Sickle cell trait Mother   . Sickle cell trait Father    Prior to Admission medications  Medication Sig Start Date End Date Taking? Authorizing Provider  acetaminophen (TYLENOL) 325 MG tablet Take 650 mg by mouth every 6 (six) hours as needed.   Yes Historical Provider, MD  calcium carbonate (TUMS - DOSED IN MG ELEMENTAL CALCIUM) 500 MG chewable tablet Chew 2 tablets by mouth 2 (two) times daily as needed for indigestion or heartburn.   Yes Historical Provider, MD  folic acid (FOLVITE) 1 MG tablet Take 1 mg by mouth daily.   Yes Historical Provider, MD  methadone (DOLOPHINE) 5 MG tablet Take 1 tablet (5 mg total) by mouth at bedtime. Patient taking differently: Take 5 mg by mouth daily.  09/01/15  Yes Olugbemiga Essie Christine, MD  oxyCODONE (ROXICODONE) 15 MG immediate release tablet Take 1 tablet (15  mg total) by mouth every 4 (four) hours as needed for pain. 09/27/15  Yes Tresa Garter, MD  Prenatal Vit-Fe Fumarate-FA (PRENATAL MULTIVITAMIN) TABS tablet Take 1 tablet by mouth daily at 12 noon.   Yes Historical Provider, MD    Physical Exam: Filed Vitals:   10/06/15 1757 10/06/15 1903 10/06/15 2218 10/06/15 2218  BP: 122/73 117/64  101/69  Pulse: 100 116  109  Temp:      TempSrc:      Resp: 16 20 18 18   Weight:      SpO2: 97% 95%  96%      Constitutional: NAD, calm, comfortable until asked to cooperate with physical exam Filed Vitals:   10/06/15 1757 10/06/15 1903 10/06/15 2218 10/06/15 2218  BP: 122/73 117/64  101/69  Pulse: 100 116  109  Temp:      TempSrc:      Resp: 16 20 18 18   Weight:      SpO2: 97% 95%  96%   Eyes: PERRL, lids and conjunctivae normal ENMT: Mucous membranes are moist. Posterior pharynx clear of any exudate or lesions.Normal dentition.  Neck: normal, supple Respiratory: Diminished bilaterally due to decreased inspiratory effort but no wheezing, no crackles. Normal respiratory effort. No accessory muscle use.  Cardiovascular: Regular rate and rhythm, no murmurs / rubs / gallops. No extremity edema. 2+ pedal pulses. Abdomen: Pregnant abdomen. Musculoskeletal: no clubbing / cyanosis. No joint deformity upper and lower extremities. Good ROM, no contractures. Normal muscle tone.  Skin: no rashes, lesions, ulcers. No induration Neurologic: CN 2-12 grossly intact. Sensation intact, Strength 5/5 in all 4.  Psychiatric: Normal judgment and insight. Alert and oriented x 3. Normal mood.   Labs on Admission: I have personally reviewed following labs and imaging studies  CBC:  Recent Labs Lab 10/03/15 2212 10/06/15 1723  WBC 14.6* 15.2*  NEUTROABS 10.8* 12.2*  HGB 8.7* 9.5*  HCT 25.1* 27.1*  MCV 84.2 84.7  PLT 490* 123XX123*   Basic Metabolic Panel:  Recent Labs Lab 10/03/15 2212 10/06/15 1723  NA 139 135  K 3.7 3.7  CL 112* 107  CO2 21*  21*  GLUCOSE 94 72  BUN 6 <5*  CREATININE 0.38* 0.41*  CALCIUM 8.8* 8.7*   GFR: Estimated Creatinine Clearance: 104.3 mL/min (by C-G formula based on Cr of 0.41). Liver Function Tests:  Recent Labs Lab 10/03/15 2212 10/06/15 1723  AST 42* 40  ALT 62* 59*  ALKPHOS 112 108  BILITOT 2.0* 3.1*  PROT 6.4* 7.1  ALBUMIN 3.3* 3.5   Anemia Panel:  Recent Labs  10/06/15 1723  RETICCTPCT 14.3*    Radiological Exams on Admission: [place] Mfm Fetal Bpp Wo Non Stress  10/06/2015  OBSTETRICAL ULTRASOUND: This  exam was performed within a [place] [facility]. The OB US report was generated in the AS system, and faxed to the ordering physician.  This report is available in the [facility]. See the AS Obstetric US report via the Image Link.  Assessment/Plan Principal Problem:   Hb-SS disease with crisis ([place]) Active Problems:   Leukocytosis   Sickle cell pain crisis ([name])   Maternal sickle cell anemia affecting pregnancy, antepartum ([name])   Narcotic dependence ([name])   Intractable pain  Intractable pain secondary to Sickle Cell pain crisis --Admit for observation --Dilaudid PCA, parameters as ordered by Dr. Zigmund Daniel during her last admission; confirmed with pharmacy.  Avoid anti-inflammatories due to pregnancy. --Anti-emetics, benadryl prn --Continue home dose of methadone  Leukocytosis --Chronic and stable  DVT prophylaxis: SCDs Code Status: FULL Family Communication: Patient alone at time of admission Disposition Plan: Discharge tomorrow Consults called: OB RN evaluated the patient in the ED Admission status: Observation, med surg   Eber Jones MD [facility]  If 7PM-7AM, please contact night-coverage [email or web address] Password [facility]  10/06/2015, 10:46 PM

## 2015-10-06 NOTE — ED Notes (Signed)
Rapid response OB called, notified team I was unable to pick up the phone due to an emergency situation in another room and asked for someone else to answer phone.

## 2015-10-06 NOTE — ED Notes (Signed)
Rapid response OBGYN RN called

## 2015-10-06 NOTE — ED Notes (Signed)
Patient here with complaints of sickle cell pain crisis since Saturday. Pain meds with no relief. [redacted] weeks pregnant.

## 2015-10-06 NOTE — ED Provider Notes (Signed)
CSN: [id number]     Arrival date & time 10/06/15  1441 History   First MD Initiated Contact with Patient 10/06/15 1617     Chief Complaint  Patient presents with  . Sickle Cell Pain Crisis     (Consider location/radiation/quality/duration/timing/severity/associated sxs/prior Treatment) The history is provided by the patient.     [name] is a 25 y.o. female who presents for her typical sickle cell crisis. She describes this as low back pain, unresponsive to her usual home medications. She denies uterine cramps, vaginal bleeding or drainage of fluid, abdominal pain, fever, chills, nausea, vomiting. She had an symmetric ultrasound today which she was told was normal. She has frequent episodes of sickle cell crisis, during her current pregnancy, which is 34 weeks. She reports having for blood transfusions during the current pregnancy. She was in the ED with a similar process. 3 days ago. There are no other known modifying factors.   Past Medical History  Diagnosis Date  . Miscarriage 03/22/2011    Pt reports 2 miscarriages.  . Depression 01/06/2011  . GERD (gastroesophageal reflux disease) 02/17/2011  . Trichotillomania     h/o  . Blood transfusion     "lots"  . Sickle cell anemia with crisis ([place])   . Exertional dyspnea     "sometimes"  . Migraines 11/08/11    "@ least twice/month"  . Chronic back pain     "very severe; have knot in my back; from tight muscle; take RX and exercise for it"  . Mood swings ([place]) 11/08/11    "I go back and forth; real bad"  . Sickle cell anemia ([name])   . Blood transfusion without reported diagnosis   . Genital HSV    Past Surgical History  Procedure Laterality Date  . Cholecystectomy  05/2010  . Dilation and curettage of uterus  02/20/11    S/P miscarriage   Family History  Problem Relation Age of Onset  . Diabetes Maternal Grandmother   . Diabetes Paternal Grandmother   . Hypertension Paternal Grandmother   . Diabetes Maternal  Grandfather   . Sickle cell trait Mother   . Sickle cell trait Father    Social History  Substance Use Topics  . Smoking status: Former Smoker -- 0.25 packs/day for 1 years    Types: Cigarettes    Quit date: 03/25/2013  . Smokeless tobacco: Never Used  . Alcohol Use: No     Comment: pt states she quit marijuan in May 2013. Rare ETOH, + cigarettes.  She is enrolled in school   OB History    Gravida Para Term Preterm AB TAB SAB Ectopic Multiple Living   3 1 1  1  1   0 1      Obstetric Comments   Miscarried in October 2012 at about 7 weeks     Review of Systems  All other systems reviewed and are negative.     Allergies  Carrot; Carrot oil; and Latex  Home Medications   Prior to Admission medications   Medication Sig Start Date End Date Taking? Authorizing Provider  acetaminophen (TYLENOL) 325 MG tablet Take 650 mg by mouth every 6 (six) hours as needed.   Yes Historical Provider, MD  calcium carbonate (TUMS - DOSED IN MG ELEMENTAL CALCIUM) 500 MG chewable tablet Chew 2 tablets by mouth 2 (two) times daily as needed for indigestion or heartburn.   Yes Historical Provider, MD  folic acid (FOLVITE) 1 MG tablet Take 1 mg by  mouth daily.   Yes Historical Provider, MD  methadone (DOLOPHINE) 5 MG tablet Take 1 tablet (5 mg total) by mouth at bedtime. Patient taking differently: Take 5 mg by mouth daily.  09/01/15  Yes Olugbemiga Essie Christine, MD  oxyCODONE (ROXICODONE) 15 MG immediate release tablet Take 1 tablet (15 mg total) by mouth every 4 (four) hours as needed for pain. 09/27/15  Yes Tresa Garter, MD  Prenatal Vit-Fe Fumarate-FA (PRENATAL MULTIVITAMIN) TABS tablet Take 1 tablet by mouth daily at 12 noon.   Yes Historical Provider, MD   BP 117/64 mmHg  Pulse 116  Temp(Src) 98.2 F (36.8 C) (Oral)  Resp 20  Wt 175 lb (79.379 kg)  SpO2 95%  LMP 02/09/2015 Physical Exam  Constitutional: She is oriented to person, place, and time. She appears well-developed and  well-nourished.  HENT:  Head: Normocephalic and atraumatic.  Right Ear: External ear normal.  Left Ear: External ear normal.  Eyes: Conjunctivae and EOM are normal. Pupils are equal, round, and reactive to light.  Neck: Normal range of motion and phonation normal. Neck supple.  Cardiovascular: Normal rate, regular rhythm and normal heart sounds.   Pulmonary/Chest: Effort normal and breath sounds normal. She exhibits no bony tenderness.  Abdominal: Soft. There is no tenderness.  Gravid. No uterine or abdominal tenderness.  Musculoskeletal: Normal range of motion.  Tender to light touch left lower back. Normal range of motion, back and neck.  Neurological: She is alert and oriented to person, place, and time. No cranial nerve deficit or sensory deficit. She exhibits normal muscle tone. Coordination normal.  Skin: Skin is warm, dry and intact.  Psychiatric: She has a normal mood and affect. Her behavior is normal. Judgment and thought content normal.  Nursing note and vitals reviewed.   ED Course  Procedures (including critical care time)  Shortly after he saw the patient, I asked the charge nurse to page the OB rapid response nurse.   Initial clinical impression- recurrent sickle cell crisis, complicated by pregnancy.   Medications  0.45 % sodium chloride infusion ( Intravenous New Bag/Given 10/06/15 1744)  diphenhydrAMINE (BENADRYL) capsule 25-50 mg (25 mg Oral Given 10/06/15 1752)  HYDROmorphone (DILAUDID) injection 3 mg (not administered)    Or  HYDROmorphone (DILAUDID) injection 3 mg (not administered)  HYDROmorphone (DILAUDID) injection 2 mg (2 mg Intravenous Given 10/06/15 1744)    Or  HYDROmorphone (DILAUDID) injection 2 mg ( Subcutaneous See Alternative 10/06/15 1744)  ondansetron (ZOFRAN) injection 4 mg (4 mg Intravenous Given 10/06/15 1752)  HYDROmorphone (DILAUDID) injection 2.5 mg (2.5 mg Intravenous Given 10/06/15 1838)    Or  HYDROmorphone (DILAUDID) injection 2.5 mg (  Subcutaneous See Alternative 10/06/15 1838)  HYDROmorphone (DILAUDID) injection 3 mg (3 mg Intravenous Given 10/06/15 2012)    Or  HYDROmorphone (DILAUDID) injection 3 mg ( Subcutaneous See Alternative 10/06/15 2012)    Patient Vitals for the past 24 hrs:  BP Temp Temp src Pulse Resp SpO2 Weight  10/06/15 1903 117/64 mmHg - - 116 20 95 % -  10/06/15 1757 122/73 mmHg - - 100 16 97 % -  10/06/15 1609 - - - - - - 175 lb (79.379 kg)  10/06/15 1519 110/62 mmHg 98.2 F (36.8 C) Oral 100 16 100 % -    10:06 PM Reevaluation with update and discussion. After initial assessment and treatment, an updated evaluation reveals She states that her pain has not been controlled. She would like to be admitted. Vital signs are reassuring. Joelle Flessner  L   10:08 PM-Consult complete with Hospitalist. Patient case explained and discussed. She agrees to admit patient for further evaluation and treatment. Call ended at 22:15  Labs Review Labs Reviewed  COMPREHENSIVE METABOLIC PANEL - Abnormal; Notable for the following:    CO2 21 (*)    BUN <5 (*)    Creatinine, Ser 0.41 (*)    Calcium 8.7 (*)    ALT 59 (*)    Total Bilirubin 3.1 (*)    All other components within normal limits  CBC WITH DIFFERENTIAL/PLATELET - Abnormal; Notable for the following:    WBC 15.2 (*)    RBC 3.20 (*)    Hemoglobin 9.5 (*)    HCT 27.1 (*)    RDW 19.3 (*)    Platelets 481 (*)    Neutro Abs 12.2 (*)    All other components within normal limits  RETICULOCYTES - Abnormal; Notable for the following:    Retic Ct Pct 14.3 (*)    RBC. 3.21 (*)    Retic Count, Manual 459.0 (*)    All other components within normal limits  LACTATE DEHYDROGENASE    Imaging Review [place] Mfm Fetal Bpp Wo Non Stress  10/06/2015  OBSTETRICAL ULTRASOUND: This exam was performed within a [place] Ultrasound Department. The OB US report was generated in the AS system, and faxed to the ordering physician.  This report is available in the [facility].  See the AS Obstetric US report via the Image Link.  I have personally reviewed and evaluated these images and lab results as part of my medical decision-making.   EKG Interpretation None      MDM   Final diagnoses:  Sickle cell crisis ([place])  Pregnancy    Sickle cell crisis, recurrent, with hemoglobin stable. Reticulocyte count is elevated, and LDH is normal. No apparent pregnancy complication. She was evaluated by OB rapid response nurse. He will require admission for stabilization.  Nursing Notes Reviewed/ Care Coordinated, and agree without changes. Applicable Imaging Reviewed.  Interpretation of Laboratory Data incorporated into ED treatment  Plan: Admit  Daleen Bo, MD 10/07/15 1230

## 2015-10-06 NOTE — ED Notes (Signed)
Rapid OB at bedside 

## 2015-10-06 NOTE — ED Notes (Signed)
Pt given turkey sandwich and apple juice

## 2015-10-06 NOTE — Progress Notes (Signed)
RROB called about patient's arrival to [facility] ED with complaints of back pain x2 days; patient is a G3P1 at 34 and 1/[redacted] weeks along in her pregnancy who has sickle cell; patient went to MFM appointment this morning for biweekly BPPs and BPP was 8/8; patient denies bleeding or leaking of fluid at this time and reports positive FM; IV fluids infusing at this time and EFM applied and assessing NST

## 2015-10-07 DIAGNOSIS — D571 Sickle-cell disease without crisis: Secondary | ICD-10-CM | POA: Diagnosis not present

## 2015-10-07 DIAGNOSIS — D72829 Elevated white blood cell count, unspecified: Secondary | ICD-10-CM | POA: Diagnosis present

## 2015-10-07 DIAGNOSIS — O99019 Anemia complicating pregnancy, unspecified trimester: Secondary | ICD-10-CM

## 2015-10-07 DIAGNOSIS — O99013 Anemia complicating pregnancy, third trimester: Secondary | ICD-10-CM | POA: Diagnosis present

## 2015-10-07 DIAGNOSIS — D5700 Hb-SS disease with crisis, unspecified: Secondary | ICD-10-CM | POA: Diagnosis not present

## 2015-10-07 DIAGNOSIS — Z3A35 35 weeks gestation of pregnancy: Secondary | ICD-10-CM | POA: Diagnosis not present

## 2015-10-07 DIAGNOSIS — F1120 Opioid dependence, uncomplicated: Secondary | ICD-10-CM | POA: Diagnosis present

## 2015-10-07 LAB — BASIC METABOLIC PANEL
ANION GAP: 7 (ref 5–15)
BUN: 6 mg/dL (ref 6–20)
CALCIUM: 9 mg/dL (ref 8.9–10.3)
CO2: 23 mmol/L (ref 22–32)
Chloride: 106 mmol/L (ref 101–111)
Creatinine, Ser: 0.45 mg/dL (ref 0.44–1.00)
GLUCOSE: 111 mg/dL — AB (ref 65–99)
POTASSIUM: 4 mmol/L (ref 3.5–5.1)
Sodium: 136 mmol/L (ref 135–145)

## 2015-10-07 LAB — CBC
HEMATOCRIT: 27.4 % — AB (ref 36.0–46.0)
Hemoglobin: 9.4 g/dL — ABNORMAL LOW (ref 12.0–15.0)
MCH: 29.7 pg (ref 26.0–34.0)
MCHC: 34.3 g/dL (ref 30.0–36.0)
MCV: 86.4 fL (ref 78.0–100.0)
PLATELETS: 509 10*3/uL — AB (ref 150–400)
RBC: 3.17 MIL/uL — AB (ref 3.87–5.11)
RDW: 18.9 % — ABNORMAL HIGH (ref 11.5–15.5)
WBC: 13.3 10*3/uL — AB (ref 4.0–10.5)

## 2015-10-07 MED ORDER — HYDROMORPHONE HCL 2 MG/ML IJ SOLN
2.0000 mg | INTRAMUSCULAR | Status: DC | PRN
  Administered 2015-10-07 (×3): 2 mg via INTRAVENOUS
  Filled 2015-10-07 (×3): qty 1

## 2015-10-07 MED ORDER — HYDROMORPHONE HCL 1 MG/ML IJ SOLN: 0.5000 mg | Freq: Once | INTRAMUSCULAR | Status: DC | PRN

## 2015-10-07 MED ORDER — HYDROMORPHONE HCL 2 MG/ML IJ SOLN
2.0000 mg | INTRAMUSCULAR | Status: DC
  Administered 2015-10-07 – 2015-10-09 (×22): 2 mg via INTRAVENOUS
  Filled 2015-10-07 (×23): qty 1

## 2015-10-07 NOTE — Progress Notes (Signed)
SICKLE CELL SERVICE PROGRESS NOTE  ANALLELY PRIVETT [id number] DOB: [birth year] DOA: 10/06/2015 PCP: Angelica Chessman, MD  Assessment/Plan: Principal Problem:   Hb-SS disease with crisis ([place]) Active Problems:   Leukocytosis   Sickle cell pain crisis ([place])   Maternal sickle cell anemia affecting pregnancy, antepartum ([place])   Narcotic dependence ([place])   Intractable pain  1. Hb SS with crisis:Pt admitted this morning. Medications reviewed. PCA at adequate dose. Will increase clinician assisted doses to every 2 hours. Pt unable to receive Toradol due ot pregnancy. 2. Leukocytosis: No evidence of infection. The leukocytosis is likely associated with the inflammatory state associated with her crisis. 3. Anemia of chronic disease: The patient has a hemoglobin of 9.4 which is essentially at the target hemoglobin of 8 recommended in a pregnant patient with sickle cell. I expect that with hydration she'll have some dilutional effect . 4. Chronic pain: Continue methadone 5 mg daily at bedtime. 5. Third trimester pregnancy: Patient alert trimester of her pregnancy. Approximately [redacted] weeks gestation age. She's been evaluated by obstetrics and DOB rapid response team will follow her daily over here.   Code Status: Full Code Family Communication: N/A Disposition Plan: Not yet ready for discharge  [place].  Pager [phone]. If 7PM-7AM, please contact night-coverage.  10/07/2015, 1:11 PM    Interim History: This is an opiate tolerant patient with hemoglobin SS with frequent hospitalizations who presented with complaints of acute sickle cell crisis. She was evaluated by the obstetrician's and found to be within normal limits with regard to her pregnancy. Today she reports pain all over. She also states that she felt a "ball" in the vaginal region while urinating. The Rapid response nurse has just completed her evaluation and fetus found to be WNL.   Consultants:  Obstetrics rapid  response team  Procedures:  None   Antibiotics:  None   Objective: Filed Vitals:   10/07/15 0642 10/07/15 0935 10/07/15 1049 10/07/15 1305  BP: 113/61  115/68   Pulse: 98  99   Temp: 98.2 F (36.8 C)  98.2 F (36.8 C)   TempSrc: Oral  Oral   Resp: 14 24 12 14   Height:      Weight: 174 lb 2.6 oz (79 kg)     SpO2: 96%  98% 99%   Weight change:   Intake/Output Summary (Last 24 hours) at 10/07/15 1311 Last data filed at 10/07/15 0100  Gross per 24 hour  Intake    240 ml  Output      0 ml  Net    240 ml       General: Alert, awake, oriented x3, in moderate distress secondary to pain.  HEENT: [place]/AT PEERL, EOMI, anicteric Neck: Trachea midline,  no masses, no thyromegal,y no JVD, no carotid bruit OROPHARYNX:  Moist, No exudate/ erythema/lesions.  Heart: Regular rate and rhythm, without murmurs, rubs, gallops, PMI non-displaced, no heaves or thrills on palpation.  Lungs: Clear to auscultation, no wheezing or rhonchi noted. No increased vocal fremitus resonant to percussion  Abdomen: Soft, nontender, nondistended, positive bowel sounds, no masses no hepatosplenomegaly noted.  Neuro: No focal neurological deficits noted cranial nerves II through XII grossly intact. Strength  in bilateral upper and lower extremities. Musculoskeletal: No warm swelling or erythema around joints, no spinal tenderness noted. Psychiatric: Patient alert and oriented x3, good insight and cognition, good recent to remote recall.    Data Reviewed: Basic Metabolic Panel:  Recent Labs Lab 10/03/15 2212 10/06/15 1723 10/07/15 [id number]  NA 139 135 136  K 3.7 3.7 4.0  CL 112* 107 106  CO2 21* 21* 23  GLUCOSE 94 72 111*  BUN 6 <5* 6  CREATININE 0.38* 0.41* 0.45  CALCIUM 8.8* 8.7* 9.0   Liver Function Tests:  Recent Labs Lab 10/03/15 2212 10/06/15 1723  AST 42* 40  ALT 62* 59*  ALKPHOS 112 108  BILITOT 2.0* 3.1*  PROT 6.4* 7.1  ALBUMIN 3.3* 3.5   No results for input(s): LIPASE,  AMYLASE in the last 168 hours. No results for input(s): AMMONIA in the last 168 hours. CBC:  Recent Labs Lab 10/03/15 2212 10/06/15 1723 10/07/15 0353  WBC 14.6* 15.2* 13.3*  NEUTROABS 10.8* 12.2*  --   HGB 8.7* 9.5* 9.4*  HCT 25.1* 27.1* 27.4*  MCV 84.2 84.7 86.4  PLT 490* 481* 509*   Cardiac Enzymes: No results for input(s): CKTOTAL, CKMB, CKMBINDEX, TROPONINI in the last 168 hours. BNP (last 3 results) No results for input(s): BNP in the last 8760 hours.  ProBNP (last 3 results) No results for input(s): PROBNP in the last 8760 hours.  CBG: No results for input(s): GLUCAP in the last 168 hours.  No results found for this or any previous visit (from the past 240 hour(s)).   Studies: Ir Fluoro Guide Cv Line Left  09/10/2015  INDICATION: Poor venous access. Sickle cell anemia. Request PICC line placement. EXAM: LEFT UPPER EXTREMITY MIDLINE PICC LINE PLACEMENT WITH ULTRASOUND AND FLUOROSCOPIC GUIDANCE MEDICATIONS: None; The antibiotic was administered within an appropriate time interval prior to skin puncture. ANESTHESIA/SEDATION: Moderate Sedation Time:  None The patient was continuously monitored during the procedure by the interventional radiology nurse under my direct supervision. FLUOROSCOPY TIME:  Fluoroscopy Time: 9 minutes 36 seconds COMPLICATIONS: None immediate. PROCEDURE: The patient was advised of the possible risks and complications and agreed to undergo the procedure. The patient was then brought to the angiographic suite for the procedure. The left arm was prepped with chlorhexidine, draped in the usual sterile fashion using maximum barrier technique (cap and mask, sterile gown, sterile gloves, large sterile sheet, hand hygiene and cutaneous antiseptic). Local anesthesia was attained by infiltration with 1% lidocaine. Ultrasound demonstrated patency of the brachial vein, and this was documented with an image. Under real-time ultrasound guidance, this vein was accessed with  a 21 gauge micropuncture needle and image documentation was performed. The needle was exchanged over a guidewire for a peel-away sheath, however the catheter could not be advanced into the SVC despite multiple attempts. Ultimately, the decision was made to leave the catheter as a midline catheter and a 24 cm 5 French dual lumen PICC was advanced, and positioned with its tip at the mid left subclavian. Fluoroscopy during the procedure and fluoro spot radiograph confirms appropriate catheter position. The catheter was flushed, secured to the skin, and covered with a sterile dressing. IMPRESSION: Successful placement of a left arm midline PICC with sonographic and fluoroscopic guidance. The catheter is ready for use. Read by: Ascencion Dike PA-C Electronically Signed   By: Sandi Mariscal M.D.   On: 09/10/2015 16:53   Ir Fluoro Guide Cv Line Right  09/25/2015  INDICATION: Sickle-cell crisis, [redacted] weeks pregnant, poor venous access. Request made for central venous access for medications /fluids. EXAM: RIGHT UPPER EXTREMITY PICC LINE PLACEMENT WITH ULTRASOUND AND FLUOROSCOPIC GUIDANCE MEDICATIONS: 1% lidocaine to skin and subcutaneous tissue ANESTHESIA/SEDATION: None FLUOROSCOPY TIME:  Fluoroscopy Time:  24 seconds COMPLICATIONS: None immediate. PROCEDURE: The patient was advised of the possible risks  and complications and agreed to undergo the procedure. The patient was then brought to the angiographic suite for the procedure. The right arm was prepped with chlorhexidine, draped in the usual sterile fashion using maximum barrier technique (cap and mask, sterile gown, sterile gloves, large sterile sheet, hand hygiene and cutaneous antisepsis) and infiltrated locally with 1% Lidocaine. Ultrasound demonstrated patency of the right basilic vein, and this was documented with an image. Under real-time ultrasound guidance, this vein was accessed with a 21 gauge micropuncture needle and image documentation was performed. A 0.018  wire was introduced in to the vein. Over this, a 5 [place] double lumen power injectable PICC was advanced to the lower SVC/right atrial junction. Fluoroscopy during the procedure and fluoro spot radiograph confirms appropriate catheter position. The catheter was flushed and covered with a sterile dressing. Catheter length: 35 cm IMPRESSION: Successful right arm Power PICC line placement with ultrasound and fluoroscopic guidance. The catheter is ready for use. Read by: Rowe Robert, PA-C Electronically Signed   By: Aletta Edouard M.D.   On: 09/25/2015 17:05   Ir US Guide Vasc Access Left  09/10/2015  INDICATION: Poor venous access. Sickle cell anemia. Request PICC line placement. EXAM: LEFT UPPER EXTREMITY MIDLINE PICC LINE PLACEMENT WITH ULTRASOUND AND FLUOROSCOPIC GUIDANCE MEDICATIONS: None; The antibiotic was administered within an appropriate time interval prior to skin puncture. ANESTHESIA/SEDATION: Moderate Sedation Time:  None The patient was continuously monitored during the procedure by the interventional radiology nurse under my direct supervision. FLUOROSCOPY TIME:  Fluoroscopy Time: 9 minutes 36 seconds COMPLICATIONS: None immediate. PROCEDURE: The patient was advised of the possible risks and complications and agreed to undergo the procedure. The patient was then brought to the angiographic suite for the procedure. The left arm was prepped with chlorhexidine, draped in the usual sterile fashion using maximum barrier technique (cap and mask, sterile gown, sterile gloves, large sterile sheet, hand hygiene and cutaneous antiseptic). Local anesthesia was attained by infiltration with 1% lidocaine. Ultrasound demonstrated patency of the brachial vein, and this was documented with an image. Under real-time ultrasound guidance, this vein was accessed with a 21 gauge micropuncture needle and image documentation was performed. The needle was exchanged over a guidewire for a peel-away sheath, however the  catheter could not be advanced into the SVC despite multiple attempts. Ultimately, the decision was made to leave the catheter as a midline catheter and a 24 cm 5 French dual lumen PICC was advanced, and positioned with its tip at the mid left subclavian. Fluoroscopy during the procedure and fluoro spot radiograph confirms appropriate catheter position. The catheter was flushed, secured to the skin, and covered with a sterile dressing. IMPRESSION: Successful placement of a left arm midline PICC with sonographic and fluoroscopic guidance. The catheter is ready for use. Read by: Ascencion Dike PA-C Electronically Signed   By: Sandi Mariscal M.D.   On: 09/10/2015 16:53   Ir US Guide Vasc Access Right  09/25/2015  INDICATION: Sickle-cell crisis, [redacted] weeks pregnant, poor venous access. Request made for central venous access for medications /fluids. EXAM: RIGHT UPPER EXTREMITY PICC LINE PLACEMENT WITH ULTRASOUND AND FLUOROSCOPIC GUIDANCE MEDICATIONS: 1% lidocaine to skin and subcutaneous tissue ANESTHESIA/SEDATION: None FLUOROSCOPY TIME:  Fluoroscopy Time:  24 seconds COMPLICATIONS: None immediate. PROCEDURE: The patient was advised of the possible risks and complications and agreed to undergo the procedure. The patient was then brought to the angiographic suite for the procedure. The right arm was prepped with chlorhexidine, draped in the usual sterile fashion  using maximum barrier technique (cap and mask, sterile gown, sterile gloves, large sterile sheet, hand hygiene and cutaneous antisepsis) and infiltrated locally with 1% Lidocaine. Ultrasound demonstrated patency of the right basilic vein, and this was documented with an image. Under real-time ultrasound guidance, this vein was accessed with a 21 gauge micropuncture needle and image documentation was performed. A 0.018 wire was introduced in to the vein. Over this, a 5 [place] double lumen power injectable PICC was advanced to the lower SVC/right atrial junction.  Fluoroscopy during the procedure and fluoro spot radiograph confirms appropriate catheter position. The catheter was flushed and covered with a sterile dressing. Catheter length: 35 cm IMPRESSION: Successful right arm Power PICC line placement with ultrasound and fluoroscopic guidance. The catheter is ready for use. Read by: Rowe Robert, PA-C Electronically Signed   By: Aletta Edouard M.D.   On: 09/25/2015 17:05   [place] Mfm Fetal Bpp Wo Non Stress  10/06/2015  OBSTETRICAL ULTRASOUND: This exam was performed within a [place] Ultrasound Department. The OB US report was generated in the AS system, and faxed to the ordering physician.  This report is available in the [facility]. See the AS Obstetric US report via the Image Link.  [place] Mfm Fetal Bpp Wo Non Stress  09/30/2015  OBSTETRICAL ULTRASOUND: This exam was performed within a [place] Ultrasound Department. The OB US report was generated in the AS system, and faxed to the ordering physician.  This report is available in the [facility]. See the AS Obstetric US report via the Image Link.  [place] Mfm Fetal Bpp Wo Non Stress  09/22/2015  OBSTETRICAL ULTRASOUND: This exam was performed within a [place] Ultrasound Department. The OB US report was generated in the AS system, and faxed to the ordering physician.  This report is available in the [facility]. See the AS Obstetric US report via the Image Link.  [place] Mfm Ob Follow Up  09/22/2015  OBSTETRICAL ULTRASOUND: This exam was performed within a [place] Ultrasound Department. The OB US report was generated in the AS system, and faxed to the ordering physician.  This report is available in the [facility]. See the AS Obstetric US report via the Image Link.   Scheduled Meds: . diphenhydrAMINE  25 mg Oral Once  . folic acid  1 mg Oral Daily  . HYDROmorphone   Intravenous Q4H  .  HYDROmorphone (DILAUDID) injection  2 mg Intravenous Q2H  . methadone  5 mg Oral QHS  . prenatal multivitamin  1 tablet  Oral Q1200   Continuous Infusions: . sodium chloride 75 mL/hr at 10/06/15 2359    Time spent 25 minutes.

## 2015-10-07 NOTE — Progress Notes (Signed)
Called dr Roselie Awkward, informed of reactive NST, no new orders at this time.

## 2015-10-07 NOTE — Progress Notes (Signed)
Patient was assisted to the bathroom, while sitting in the toilet bowl she claimed that she felt like a hard ball in her vaginal area.assisted back to bed and Rn did not see anything remarkable.Ob nurse informed.Md informed.

## 2015-10-08 DIAGNOSIS — D5700 Hb-SS disease with crisis, unspecified: Secondary | ICD-10-CM | POA: Diagnosis not present

## 2015-10-08 DIAGNOSIS — D571 Sickle-cell disease without crisis: Secondary | ICD-10-CM | POA: Diagnosis not present

## 2015-10-08 DIAGNOSIS — O99019 Anemia complicating pregnancy, unspecified trimester: Secondary | ICD-10-CM | POA: Diagnosis not present

## 2015-10-08 DIAGNOSIS — D72829 Elevated white blood cell count, unspecified: Secondary | ICD-10-CM

## 2015-10-08 DIAGNOSIS — O99013 Anemia complicating pregnancy, third trimester: Secondary | ICD-10-CM | POA: Diagnosis not present

## 2015-10-08 NOTE — Progress Notes (Signed)
1830  Here to see Ms. Gotschall for her QD NST.  Patient denies vaginal bleeding, leaking of fluid, or contractions.  She is 34.[redacted] wks GA today.  She is very animated and talking on the phone and moving about throughout NST.  Fetal movement palpated.  1903  EFM off.  FHR Category I, no UC's noted, artifact from patient and fetal movement noted. Dr. Roselie Awkward was called with daily update. No new orders received.

## 2015-10-08 NOTE — Care Management Note (Signed)
Case Management Note  Patient Details  Name: CYLAH FRIEDT MRN: [id number] Date of Birth: 24-Jan-1991  Subjective/Objective:  25 yo admitted with [facility]                  Action/Plan: From home with children.  Chart reviewed and no CM needs noted or communicated at this time.  CM will continue to follow.  Expected Discharge Date:                  Expected Discharge Plan:  Home/Self Care  In-House Referral:     Discharge planning Services  CM Consult  Post Acute Care Choice:    Choice offered to:     DME Arranged:    DME Agency:     HH Arranged:    HH Agency:     Status of Service:  In process, will continue to follow  Medicare Important Message Given:    Date Medicare IM Given:    Medicare IM give by:    Date Additional Medicare IM Given:    Additional Medicare Important Message give by:     If discussed at [place] of Stay Meetings, dates discussed:    Additional CommentsLynnell Catalan, RN 10/08/2015, 2:37 PM  [phone]

## 2015-10-08 NOTE — Progress Notes (Signed)
SICKLE CELL SERVICE PROGRESS NOTE  [name] [id number] DOB: 10/10/1990 DOA: 10/06/2015 PCP: Angelica Chessman, MD  Assessment/Plan: Principal Problem:   Hb-SS disease with crisis ([place]) Active Problems:   Leukocytosis   Sickle cell pain crisis ([place])   Maternal sickle cell anemia affecting pregnancy, antepartum ([name])   Sickle cell anemia ([name])   Narcotic dependence ([name])   Intractable pain  1. Hb SS with crisis: Continue PCA and clinician assisted Dilaudid at current doses. Patient eating and drinking well so will continue IV fluids and a decreased dose.  2. Leukocytosis: No evidence of infection. The leukocytosis is likely associated with the inflammatory state associated with her crisis. 3. Anemia of chronic disease: The patient has a hemoglobin of 9.4 which is essentially at the target hemoglobin of 8 recommended in a pregnant patient with sickle cell. I expect that with hydration she'll have some dilutional effect . 4. Chronic pain: Continue methadone 5 mg daily at bedtime. 5. Third trimester pregnancy: Patient alert trimester of her pregnancy. Approximately [redacted] weeks gestation age. She's been evaluated by obstetrics and DOB rapid response team will follow her daily over here.   Code Status: Full Code Family Communication: N/A Disposition Plan: Not yet ready for discharge  [place].  Pager [phone]. If 7PM-7AM, please contact night-coverage.  10/08/2015, 3:20 PM  LOS: 1 day   Interim History: Patient reports that she feels a little bit better today. She rates her pain as 8/10 and localized to low back and legs.  Consultants:  Obstetrics rapid response team  Procedures:  None   Antibiotics:  None   Objective: Filed Vitals:   10/08/15 0816 10/08/15 0945 10/08/15 1229 10/08/15 1246  BP:  125/65  99/80  Pulse:  105  77  Temp:  98.7 F (37.1 C)  98.2 F (36.8 C)  TempSrc:  Oral  Oral  Resp: 17 14 17 14   Height:      Weight:      SpO2: 96%  98% 97% 98%   Weight change: 10 lb 13.6 oz (4.92 kg)  Intake/Output Summary (Last 24 hours) at 10/08/15 1520 Last data filed at 10/08/15 1246  Gross per 24 hour  Intake 4062.09 ml  Output   1600 ml  Net 2462.09 ml       General: Alert, awake, oriented x3, in minimal distress secondary to pain.  HEENT: [place]/AT PEERL, EOMI, anicteric Neck: Trachea midline,  no masses, no thyromegal,y no JVD, no carotid bruit OROPHARYNX:  Moist, No exudate/ erythema/lesions.  Heart: Regular rate and rhythm, without murmurs, rubs, gallops, PMI non-displaced, no heaves or thrills on palpation.  Lungs: Clear to auscultation, no wheezing or rhonchi noted. No increased vocal fremitus resonant to percussion  Abdomen: Gravid Neuro: No focal neurological deficits noted cranial nerves II through XII grossly intact. Strength  in bilateral upper and lower extremities. Musculoskeletal: No warm swelling or erythema around joints, no spinal tenderness noted. Psychiatric: Patient alert and oriented x3, good insight and cognition, good recent to remote recall.    Data Reviewed: Basic Metabolic Panel:  Recent Labs Lab 10/03/15 2212 10/06/15 1723 10/07/15 0353  NA 139 135 136  K 3.7 3.7 4.0  CL 112* 107 106  CO2 21* 21* 23  GLUCOSE 94 72 111*  BUN 6 <5* 6  CREATININE 0.38* 0.41* 0.45  CALCIUM 8.8* 8.7* 9.0   Liver Function Tests:  Recent Labs Lab 10/03/15 2212 10/06/15 1723  AST 42* 40  ALT 62* 59*  ALKPHOS 112 108  BILITOT 2.0* 3.1*  PROT 6.4* 7.1  ALBUMIN 3.3* 3.5   No results for input(s): LIPASE, AMYLASE in the last 168 hours. No results for input(s): AMMONIA in the last 168 hours. CBC:  Recent Labs Lab 10/03/15 2212 10/06/15 1723 10/07/15 0353  WBC 14.6* 15.2* 13.3*  NEUTROABS 10.8* 12.2*  --   HGB 8.7* 9.5* 9.4*  HCT 25.1* 27.1* 27.4*  MCV 84.2 84.7 86.4  PLT 490* 481* 509*   Cardiac Enzymes: No results for input(s): CKTOTAL, CKMB, CKMBINDEX, TROPONINI in the last 168  hours. BNP (last 3 results) No results for input(s): BNP in the last 8760 hours.  ProBNP (last 3 results) No results for input(s): PROBNP in the last 8760 hours.  CBG: No results for input(s): GLUCAP in the last 168 hours.  No results found for this or any previous visit (from the past 240 hour(s)).   Studies: Ir Fluoro Guide Cv Line Left  09/10/2015  INDICATION: Poor venous access. Sickle cell anemia. Request PICC line placement. EXAM: LEFT UPPER EXTREMITY MIDLINE PICC LINE PLACEMENT WITH ULTRASOUND AND FLUOROSCOPIC GUIDANCE MEDICATIONS: None; The antibiotic was administered within an appropriate time interval prior to skin puncture. ANESTHESIA/SEDATION: Moderate Sedation Time:  None The patient was continuously monitored during the procedure by the interventional radiology nurse under my direct supervision. FLUOROSCOPY TIME:  Fluoroscopy Time: 9 minutes 36 seconds COMPLICATIONS: None immediate. PROCEDURE: The patient was advised of the possible risks and complications and agreed to undergo the procedure. The patient was then brought to the angiographic suite for the procedure. The left arm was prepped with chlorhexidine, draped in the usual sterile fashion using maximum barrier technique (cap and mask, sterile gown, sterile gloves, large sterile sheet, hand hygiene and cutaneous antiseptic). Local anesthesia was attained by infiltration with 1% lidocaine. Ultrasound demonstrated patency of the brachial vein, and this was documented with an image. Under real-time ultrasound guidance, this vein was accessed with a 21 gauge micropuncture needle and image documentation was performed. The needle was exchanged over a guidewire for a peel-away sheath, however the catheter could not be advanced into the SVC despite multiple attempts. Ultimately, the decision was made to leave the catheter as a midline catheter and a 24 cm 5 French dual lumen PICC was advanced, and positioned with its tip at the mid left  subclavian. Fluoroscopy during the procedure and fluoro spot radiograph confirms appropriate catheter position. The catheter was flushed, secured to the skin, and covered with a sterile dressing. IMPRESSION: Successful placement of a left arm midline PICC with sonographic and fluoroscopic guidance. The catheter is ready for use. Read by: Ascencion Dike PA-C Electronically Signed   By: Sandi Mariscal M.D.   On: 09/10/2015 16:53   Ir Fluoro Guide Cv Line Right  09/25/2015  INDICATION: Sickle-cell crisis, [redacted] weeks pregnant, poor venous access. Request made for central venous access for medications /fluids. EXAM: RIGHT UPPER EXTREMITY PICC LINE PLACEMENT WITH ULTRASOUND AND FLUOROSCOPIC GUIDANCE MEDICATIONS: 1% lidocaine to skin and subcutaneous tissue ANESTHESIA/SEDATION: None FLUOROSCOPY TIME:  Fluoroscopy Time:  24 seconds COMPLICATIONS: None immediate. PROCEDURE: The patient was advised of the possible risks and complications and agreed to undergo the procedure. The patient was then brought to the angiographic suite for the procedure. The right arm was prepped with chlorhexidine, draped in the usual sterile fashion using maximum barrier technique (cap and mask, sterile gown, sterile gloves, large sterile sheet, hand hygiene and cutaneous antisepsis) and infiltrated locally with 1% Lidocaine. Ultrasound demonstrated patency of the right basilic  vein, and this was documented with an image. Under real-time ultrasound guidance, this vein was accessed with a 21 gauge micropuncture needle and image documentation was performed. A 0.018 wire was introduced in to the vein. Over this, a 5 [place] double lumen power injectable PICC was advanced to the lower SVC/right atrial junction. Fluoroscopy during the procedure and fluoro spot radiograph confirms appropriate catheter position. The catheter was flushed and covered with a sterile dressing. Catheter length: 35 cm IMPRESSION: Successful right arm Power PICC line placement with  ultrasound and fluoroscopic guidance. The catheter is ready for use. Read by: Rowe Robert, PA-C Electronically Signed   By: Aletta Edouard M.D.   On: 09/25/2015 17:05   Ir US Guide Vasc Access Left  09/10/2015  INDICATION: Poor venous access. Sickle cell anemia. Request PICC line placement. EXAM: LEFT UPPER EXTREMITY MIDLINE PICC LINE PLACEMENT WITH ULTRASOUND AND FLUOROSCOPIC GUIDANCE MEDICATIONS: None; The antibiotic was administered within an appropriate time interval prior to skin puncture. ANESTHESIA/SEDATION: Moderate Sedation Time:  None The patient was continuously monitored during the procedure by the interventional radiology nurse under my direct supervision. FLUOROSCOPY TIME:  Fluoroscopy Time: 9 minutes 36 seconds COMPLICATIONS: None immediate. PROCEDURE: The patient was advised of the possible risks and complications and agreed to undergo the procedure. The patient was then brought to the angiographic suite for the procedure. The left arm was prepped with chlorhexidine, draped in the usual sterile fashion using maximum barrier technique (cap and mask, sterile gown, sterile gloves, large sterile sheet, hand hygiene and cutaneous antiseptic). Local anesthesia was attained by infiltration with 1% lidocaine. Ultrasound demonstrated patency of the brachial vein, and this was documented with an image. Under real-time ultrasound guidance, this vein was accessed with a 21 gauge micropuncture needle and image documentation was performed. The needle was exchanged over a guidewire for a peel-away sheath, however the catheter could not be advanced into the SVC despite multiple attempts. Ultimately, the decision was made to leave the catheter as a midline catheter and a 24 cm 5 French dual lumen PICC was advanced, and positioned with its tip at the mid left subclavian. Fluoroscopy during the procedure and fluoro spot radiograph confirms appropriate catheter position. The catheter was flushed, secured to the  skin, and covered with a sterile dressing. IMPRESSION: Successful placement of a left arm midline PICC with sonographic and fluoroscopic guidance. The catheter is ready for use. Read by: Ascencion Dike PA-C Electronically Signed   By: Sandi Mariscal M.D.   On: 09/10/2015 16:53   Ir US Guide Vasc Access Right  09/25/2015  INDICATION: Sickle-cell crisis, [redacted] weeks pregnant, poor venous access. Request made for central venous access for medications /fluids. EXAM: RIGHT UPPER EXTREMITY PICC LINE PLACEMENT WITH ULTRASOUND AND FLUOROSCOPIC GUIDANCE MEDICATIONS: 1% lidocaine to skin and subcutaneous tissue ANESTHESIA/SEDATION: None FLUOROSCOPY TIME:  Fluoroscopy Time:  24 seconds COMPLICATIONS: None immediate. PROCEDURE: The patient was advised of the possible risks and complications and agreed to undergo the procedure. The patient was then brought to the angiographic suite for the procedure. The right arm was prepped with chlorhexidine, draped in the usual sterile fashion using maximum barrier technique (cap and mask, sterile gown, sterile gloves, large sterile sheet, hand hygiene and cutaneous antisepsis) and infiltrated locally with 1% Lidocaine. Ultrasound demonstrated patency of the right basilic vein, and this was documented with an image. Under real-time ultrasound guidance, this vein was accessed with a 21 gauge micropuncture needle and image documentation was performed. A 0.018 wire was introduced in  to the vein. Over this, a 5 [place] double lumen power injectable PICC was advanced to the lower SVC/right atrial junction. Fluoroscopy during the procedure and fluoro spot radiograph confirms appropriate catheter position. The catheter was flushed and covered with a sterile dressing. Catheter length: 35 cm IMPRESSION: Successful right arm Power PICC line placement with ultrasound and fluoroscopic guidance. The catheter is ready for use. Read by: Rowe Robert, PA-C Electronically Signed   By: Aletta Edouard M.D.   On:  09/25/2015 17:05   [place] Mfm Fetal Bpp Wo Non Stress  10/06/2015  OBSTETRICAL ULTRASOUND: This exam was performed within a [place] [facility]. The OB US report was generated in the AS system, and faxed to the ordering physician.  This report is available in the [facility]. See the AS Obstetric US report via the Image Link.  [place] Mfm Fetal Bpp Wo Non Stress  09/30/2015  OBSTETRICAL ULTRASOUND: This exam was performed within a [place] [facility] Department. The OB US report was generated in the AS system, and faxed to the ordering physician.  This report is available in the [facility]. See the AS Obstetric US report via the Image Link.  [place] Mfm Fetal Bpp Wo Non Stress  09/22/2015  OBSTETRICAL ULTRASOUND: This exam was performed within a [place] [facility] Department. The OB US report was generated in the AS system, and faxed to the ordering physician.  This report is available in the [facility]. See the AS Obstetric US report via the Image Link.  [place] Mfm Ob Follow Up  09/22/2015  OBSTETRICAL ULTRASOUND: This exam was performed within a [place] [facility]ound Department. The OB US report was generated in the AS system, and faxed to the ordering physician.  This report is available in the [facility]. See the AS Obstetric US report via the Image Link.   Scheduled Meds: . folic acid  1 mg Oral Daily  . HYDROmorphone   Intravenous Q4H  .  HYDROmorphone (DILAUDID) injection  2 mg Intravenous Q2H  . methadone  5 mg Oral QHS  . prenatal multivitamin  1 tablet Oral Q1200   Continuous Infusions: . sodium chloride 125 mL/hr at 10/08/15 0553    Time spent 25 minutes.

## 2015-10-09 ENCOUNTER — Other Ambulatory Visit: Payer: Self-pay | Admitting: Internal Medicine

## 2015-10-09 DIAGNOSIS — D638 Anemia in other chronic diseases classified elsewhere: Secondary | ICD-10-CM

## 2015-10-09 DIAGNOSIS — O99013 Anemia complicating pregnancy, third trimester: Secondary | ICD-10-CM | POA: Diagnosis not present

## 2015-10-09 DIAGNOSIS — G8929 Other chronic pain: Secondary | ICD-10-CM

## 2015-10-09 DIAGNOSIS — D5700 Hb-SS disease with crisis, unspecified: Secondary | ICD-10-CM | POA: Diagnosis not present

## 2015-10-09 DIAGNOSIS — O99019 Anemia complicating pregnancy, unspecified trimester: Secondary | ICD-10-CM | POA: Diagnosis not present

## 2015-10-09 DIAGNOSIS — D571 Sickle-cell disease without crisis: Secondary | ICD-10-CM

## 2015-10-09 DIAGNOSIS — F1120 Opioid dependence, uncomplicated: Secondary | ICD-10-CM | POA: Diagnosis not present

## 2015-10-09 LAB — CBC WITH DIFFERENTIAL/PLATELET
BASOS ABS: 0 10*3/uL (ref 0.0–0.1)
BASOS PCT: 0 %
EOS PCT: 2 %
Eosinophils Absolute: 0.3 10*3/uL (ref 0.0–0.7)
HEMATOCRIT: 24.1 % — AB (ref 36.0–46.0)
HEMOGLOBIN: 8.2 g/dL — AB (ref 12.0–15.0)
Lymphocytes Relative: 13 %
Lymphs Abs: 2.2 10*3/uL (ref 0.7–4.0)
MCH: 29.7 pg (ref 26.0–34.0)
MCHC: 34 g/dL (ref 30.0–36.0)
MCV: 87.3 fL (ref 78.0–100.0)
Monocytes Absolute: 1.2 10*3/uL — ABNORMAL HIGH (ref 0.1–1.0)
Monocytes Relative: 7 %
NRBC: 2 /100{WBCs} — AB
Neutro Abs: 13.1 10*3/uL — ABNORMAL HIGH (ref 1.7–7.7)
Neutrophils Relative %: 78 %
PLATELETS: UNDETERMINED 10*3/uL (ref 150–400)
RBC: 2.76 MIL/uL — ABNORMAL LOW (ref 3.87–5.11)
RDW: 19.7 % — ABNORMAL HIGH (ref 11.5–15.5)
WBC: 16.8 10*3/uL — AB (ref 4.0–10.5)

## 2015-10-09 LAB — BASIC METABOLIC PANEL
ANION GAP: 5 (ref 5–15)
BUN: 7 mg/dL (ref 6–20)
CALCIUM: 8.4 mg/dL — AB (ref 8.9–10.3)
CO2: 23 mmol/L (ref 22–32)
Chloride: 106 mmol/L (ref 101–111)
Creatinine, Ser: 0.61 mg/dL (ref 0.44–1.00)
GLUCOSE: 85 mg/dL (ref 65–99)
POTASSIUM: 3.9 mmol/L (ref 3.5–5.1)
Sodium: 134 mmol/L — ABNORMAL LOW (ref 135–145)

## 2015-10-09 MED ORDER — OXYCODONE HCL 15 MG PO TABS: 15.0000 mg | ORAL_TABLET | ORAL | Status: DC | PRN

## 2015-10-09 MED ORDER — METHADONE HCL 5 MG PO TABS: 5.0000 mg | ORAL_TABLET | Freq: Every day | ORAL | Status: DC

## 2015-10-09 MED ORDER — HYDROMORPHONE HCL 2 MG/ML IJ SOLN
2.0000 mg | INTRAMUSCULAR | Status: DC | PRN
  Administered 2015-10-09 – 2015-10-10 (×9): 2 mg via INTRAVENOUS
  Filled 2015-10-09 (×9): qty 1

## 2015-10-09 NOTE — Progress Notes (Signed)
SICKLE CELL SERVICE PROGRESS NOTE  [name] [id number] DOB: 12-26-1990 DOA: 10/06/2015 PCP: Angelica Chessman, MD  Assessment/Plan: Principal Problem:   Hb-SS disease with crisis ([place]) Active Problems:   Leukocytosis   Sickle cell pain crisis ([place])   Maternal sickle cell anemia affecting pregnancy, antepartum ([name])   Sickle cell anemia ([name])   Narcotic dependence ([name])   Intractable pain  1. Hb SS with crisis: Continue PCA and clinician assisted Dilaudid at current doses. Patient eating and drinking well so will decrease IV fluids to [facility].  2. Leukocytosis: No evidence of infection. The leukocytosis is likely associated with the inflammatory state associated with her crisis. 3. Anemia of chronic disease: The patient has a hemoglobin of 8.2  which is essentially at the target hemoglobin of 8 recommended in a pregnant patient with sickle cell disease. 4. Chronic pain: Continue methadone 5 mg daily at bedtime. 5. Third trimester pregnancy: Patient alert trimester of her pregnancy. Approximately [redacted] weeks gestation age. She's been evaluated by obstetrics and DOB rapid response team following her daily over here.   Code Status: Full Code Family Communication: N/A Disposition Plan: Not yet ready for discharge  [place].  Pager [phone]. If 7PM-7AM, please contact night-coverage.  10/09/2015, 12:23 PM  LOS: 2 days   Interim History: Patient reports that she feels significantly better today. She rates her pain as 7/10 and localized to low back and legs. She has used 31 mg with 56/52:demands/deliveries on the PCA and 20 mg in clinician assisted doses in the last 24 hours.  Consultants:  Obstetrics rapid response team  Procedures:  None   Antibiotics:  None   Objective: Filed Vitals:   10/09/15 0359 10/09/15 0658 10/09/15 0823 10/09/15 1003  BP:  113/58  117/76  Pulse:  112  109  Temp:  98.5 F (36.9 C)  98.8 F (37.1 C)  TempSrc:  Oral  Oral  Resp:  16 16 16 12   Height:      Weight:      SpO2: 95% 96% 96% 97%   Weight change:   Intake/Output Summary (Last 24 hours) at 10/09/15 1223 Last data filed at 10/08/15 1818  Gross per 24 hour  Intake    640 ml  Output    800 ml  Net   -160 ml       General: Alert, awake, oriented x3, in minimal distress secondary to pain.  HEENT: [place]/AT PEERL, EOMI, anicteric Neck: Trachea midline,  no masses, no thyromegal,y no JVD, no carotid bruit OROPHARYNX:  Moist, No exudate/ erythema/lesions.  Heart: Regular rate and rhythm, without murmurs, rubs, gallops, PMI non-displaced, no heaves or thrills on palpation.  Lungs: Clear to auscultation, no wheezing or rhonchi noted. No increased vocal fremitus resonant to percussion  Abdomen: Gravid Neuro: No focal neurological deficits noted cranial nerves II through XII grossly intact. Strength  in bilateral upper and lower extremities. Musculoskeletal: No warm swelling or erythema around joints, no spinal tenderness noted. Psychiatric: Patient alert and oriented x3, good insight and cognition, good recent to remote recall.    Data Reviewed: Basic Metabolic Panel:  Recent Labs Lab 10/03/15 2212 10/06/15 1723 10/07/15 0353 10/09/15 0527  NA 139 135 136 134*  K 3.7 3.7 4.0 3.9  CL 112* 107 106 106  CO2 21* 21* 23 23  GLUCOSE 94 72 111* 85  BUN 6 <5* 6 7  CREATININE 0.38* 0.41* 0.45 0.61  CALCIUM 8.8* 8.7* 9.0 8.4*   Liver Function Tests:  Recent  Labs Lab 10/03/15 2212 10/06/15 1723  AST 42* 40  ALT 62* 59*  ALKPHOS 112 108  BILITOT 2.0* 3.1*  PROT 6.4* 7.1  ALBUMIN 3.3* 3.5   No results for input(s): LIPASE, AMYLASE in the last 168 hours. No results for input(s): AMMONIA in the last 168 hours. CBC:  Recent Labs Lab 10/03/15 2212 10/06/15 1723 10/07/15 0353 10/09/15 0527  WBC 14.6* 15.2* 13.3* 16.8*  NEUTROABS 10.8* 12.2*  --  13.1*  HGB 8.7* 9.5* 9.4* 8.2*  HCT 25.1* 27.1* 27.4* 24.1*  MCV 84.2 84.7 86.4 87.3  PLT 490*  481* 509* PLATELET CLUMPS NOTED ON SMEAR, UNABLE TO ESTIMATE   Cardiac Enzymes: No results for input(s): CKTOTAL, CKMB, CKMBINDEX, TROPONINI in the last 168 hours. BNP (last 3 results) No results for input(s): BNP in the last 8760 hours.  ProBNP (last 3 results) No results for input(s): PROBNP in the last 8760 hours.  CBG: No results for input(s): GLUCAP in the last 168 hours.  No results found for this or any previous visit (from the past 240 hour(s)).   Studies: Ir Fluoro Guide Cv Line Left  09/10/2015  INDICATION: Poor venous access. Sickle cell anemia. Request PICC line placement. EXAM: LEFT UPPER EXTREMITY MIDLINE PICC LINE PLACEMENT WITH ULTRASOUND AND FLUOROSCOPIC GUIDANCE MEDICATIONS: None; The antibiotic was administered within an appropriate time interval prior to skin puncture. ANESTHESIA/SEDATION: Moderate Sedation Time:  None The patient was continuously monitored during the procedure by the interventional radiology nurse under my direct supervision. FLUOROSCOPY TIME:  Fluoroscopy Time: 9 minutes 36 seconds COMPLICATIONS: None immediate. PROCEDURE: The patient was advised of the possible risks and complications and agreed to undergo the procedure. The patient was then brought to the angiographic suite for the procedure. The left arm was prepped with chlorhexidine, draped in the usual sterile fashion using maximum barrier technique (cap and mask, sterile gown, sterile gloves, large sterile sheet, hand hygiene and cutaneous antiseptic). Local anesthesia was attained by infiltration with 1% lidocaine. Ultrasound demonstrated patency of the brachial vein, and this was documented with an image. Under real-time ultrasound guidance, this vein was accessed with a 21 gauge micropuncture needle and image documentation was performed. The needle was exchanged over a guidewire for a peel-away sheath, however the catheter could not be advanced into the SVC despite multiple attempts. Ultimately, the  decision was made to leave the catheter as a midline catheter and a 24 cm 5 French dual lumen PICC was advanced, and positioned with its tip at the mid left subclavian. Fluoroscopy during the procedure and fluoro spot radiograph confirms appropriate catheter position. The catheter was flushed, secured to the skin, and covered with a sterile dressing. IMPRESSION: Successful placement of a left arm midline PICC with sonographic and fluoroscopic guidance. The catheter is ready for use. Read by: Ascencion Dike PA-C Electronically Signed   By: Sandi Mariscal M.D.   On: 09/10/2015 16:53   Ir Fluoro Guide Cv Line Right  09/25/2015  INDICATION: Sickle-cell crisis, [redacted] weeks pregnant, poor venous access. Request made for central venous access for medications /fluids. EXAM: RIGHT UPPER EXTREMITY PICC LINE PLACEMENT WITH ULTRASOUND AND FLUOROSCOPIC GUIDANCE MEDICATIONS: 1% lidocaine to skin and subcutaneous tissue ANESTHESIA/SEDATION: None FLUOROSCOPY TIME:  Fluoroscopy Time:  24 seconds COMPLICATIONS: None immediate. PROCEDURE: The patient was advised of the possible risks and complications and agreed to undergo the procedure. The patient was then brought to the angiographic suite for the procedure. The right arm was prepped with chlorhexidine, draped in the usual  sterile fashion using maximum barrier technique (cap and mask, sterile gown, sterile gloves, large sterile sheet, hand hygiene and cutaneous antisepsis) and infiltrated locally with 1% Lidocaine. Ultrasound demonstrated patency of the right basilic vein, and this was documented with an image. Under real-time ultrasound guidance, this vein was accessed with a 21 gauge micropuncture needle and image documentation was performed. A 0.018 wire was introduced in to the vein. Over this, a 5 [place] double lumen power injectable PICC was advanced to the lower SVC/right atrial junction. Fluoroscopy during the procedure and fluoro spot radiograph confirms appropriate catheter  position. The catheter was flushed and covered with a sterile dressing. Catheter length: 35 cm IMPRESSION: Successful right arm Power PICC line placement with ultrasound and fluoroscopic guidance. The catheter is ready for use. Read by: Rowe Robert, PA-C Electronically Signed   By: Aletta Edouard M.D.   On: 09/25/2015 17:05   Ir US Guide Vasc Access Left  09/10/2015  INDICATION: Poor venous access. Sickle cell anemia. Request PICC line placement. EXAM: LEFT UPPER EXTREMITY MIDLINE PICC LINE PLACEMENT WITH ULTRASOUND AND FLUOROSCOPIC GUIDANCE MEDICATIONS: None; The antibiotic was administered within an appropriate time interval prior to skin puncture. ANESTHESIA/SEDATION: Moderate Sedation Time:  None The patient was continuously monitored during the procedure by the interventional radiology nurse under my direct supervision. FLUOROSCOPY TIME:  Fluoroscopy Time: 9 minutes 36 seconds COMPLICATIONS: None immediate. PROCEDURE: The patient was advised of the possible risks and complications and agreed to undergo the procedure. The patient was then brought to the angiographic suite for the procedure. The left arm was prepped with chlorhexidine, draped in the usual sterile fashion using maximum barrier technique (cap and mask, sterile gown, sterile gloves, large sterile sheet, hand hygiene and cutaneous antiseptic). Local anesthesia was attained by infiltration with 1% lidocaine. Ultrasound demonstrated patency of the brachial vein, and this was documented with an image. Under real-time ultrasound guidance, this vein was accessed with a 21 gauge micropuncture needle and image documentation was performed. The needle was exchanged over a guidewire for a peel-away sheath, however the catheter could not be advanced into the SVC despite multiple attempts. Ultimately, the decision was made to leave the catheter as a midline catheter and a 24 cm 5 French dual lumen PICC was advanced, and positioned with its tip at the mid  left subclavian. Fluoroscopy during the procedure and fluoro spot radiograph confirms appropriate catheter position. The catheter was flushed, secured to the skin, and covered with a sterile dressing. IMPRESSION: Successful placement of a left arm midline PICC with sonographic and fluoroscopic guidance. The catheter is ready for use. Read by: Ascencion Dike PA-C Electronically Signed   By: Sandi Mariscal M.D.   On: 09/10/2015 16:53   Ir US Guide Vasc Access Right  09/25/2015  INDICATION: Sickle-cell crisis, [redacted] weeks pregnant, poor venous access. Request made for central venous access for medications /fluids. EXAM: RIGHT UPPER EXTREMITY PICC LINE PLACEMENT WITH ULTRASOUND AND FLUOROSCOPIC GUIDANCE MEDICATIONS: 1% lidocaine to skin and subcutaneous tissue ANESTHESIA/SEDATION: None FLUOROSCOPY TIME:  Fluoroscopy Time:  24 seconds COMPLICATIONS: None immediate. PROCEDURE: The patient was advised of the possible risks and complications and agreed to undergo the procedure. The patient was then brought to the angiographic suite for the procedure. The right arm was prepped with chlorhexidine, draped in the usual sterile fashion using maximum barrier technique (cap and mask, sterile gown, sterile gloves, large sterile sheet, hand hygiene and cutaneous antisepsis) and infiltrated locally with 1% Lidocaine. Ultrasound demonstrated patency of the right  basilic vein, and this was documented with an image. Under real-time ultrasound guidance, this vein was accessed with a 21 gauge micropuncture needle and image documentation was performed. A 0.018 wire was introduced in to the vein. Over this, a 5 [place] double lumen power injectable PICC was advanced to the lower SVC/right atrial junction. Fluoroscopy during the procedure and fluoro spot radiograph confirms appropriate catheter position. The catheter was flushed and covered with a sterile dressing. Catheter length: 35 cm IMPRESSION: Successful right arm Power PICC line placement  with ultrasound and fluoroscopic guidance. The catheter is ready for use. Read by: Rowe Robert, PA-C Electronically Signed   By: Aletta Edouard M.D.   On: 09/25/2015 17:05   [place] Mfm Fetal Bpp Wo Non Stress  10/06/2015  OBSTETRICAL ULTRASOUND: This exam was performed within a [facility] Department. The OB US report was generated in the AS system, and faxed to the ordering physician.  This report is available in the [facility]. See the AS Obstetric US report via the Image Link.  [place] Mfm Fetal Bpp Wo Non Stress  09/30/2015  OBSTETRICAL ULTRASOUND: This exam was performed within a [place] [facility] Department. The OB US report was generated in the AS system, and faxed to the ordering physician.  This report is available in the [facility]. See the AS Obstetric US report via the Image Link.  [place] Mfm Fetal Bpp Wo Non Stress  09/22/2015  OBSTETRICAL ULTRASOUND: This exam was performed within a [place] Ultrasound Department. The OB US report was generated in the AS system, and faxed to the ordering physician.  This report is available in the [facility]. See the AS Obstetric US report via the Image Link.  [place] Mfm Ob Follow Up  09/22/2015  OBSTETRICAL ULTRASOUND: This exam was performed within a  Ultrasound Department. The OB US report was generated in the AS system, and faxed to the ordering physician.  This report is available in the [facility]. See the AS Obstetric US report via the Image Link.   Scheduled Meds: . folic acid  1 mg Oral Daily  . HYDROmorphone   Intravenous Q4H  . methadone  5 mg Oral QHS  . prenatal multivitamin  1 tablet Oral Q1200   Continuous Infusions: . sodium chloride 75 mL/hr at 10/09/15 0401    Time spent 25 minutes.

## 2015-10-09 NOTE — Progress Notes (Signed)
Called dr Kennon Rounds informed of fhr tracing, sve, fullness felt in perineum. She will further evaluate after pt delivers. No new orders received. Pt denies leaking fluid or bleeding, or uc's. Baby moving well.

## 2015-10-09 NOTE — Progress Notes (Signed)
[name] informed [facility] that pt is feeling a "ball" in her vagina and would like [place] to evaluate. Called DR Kennon Rounds, informed of pt status, ok to check cervix, and to call back if pt needs further evaluation, informed NST in progress.

## 2015-10-10 DIAGNOSIS — D5700 Hb-SS disease with crisis, unspecified: Secondary | ICD-10-CM | POA: Diagnosis not present

## 2015-10-10 NOTE — Discharge Summary (Signed)
Physician Discharge Summary  Patient ID: [name] MRN: [id number] DOB/AGE: 07-Oct-1990 25 y.o.  Admit date: 10/06/2015 Discharge date: 10/10/2015  Admission Diagnoses:  Discharge Diagnoses:  Principal Problem:   Hb-SS disease with crisis [facility]) Active Problems:   Leukocytosis   Sickle cell pain crisis ([place])   Maternal sickle cell anemia affecting pregnancy, antepartum ([name])   Sickle cell anemia ([name])   Narcotic dependence ([place])   Intractable pain   Discharged Condition: good  Hospital Course: Patient is a 25 year old female with history of sickle cell disease who is pregnant in her third trimester presenting to the ER with nausea vomiting and pain. Patient appeared to be in her typical sickle cell crisis and was admitted to the hospital for sickle cell pain management. She was placed on Dilaudid PCA and IV fluids. Due to pregnancy she did not get any Toradol or nonsteroidals. She also has physician assisted dosing. Her initial hemoglobin was 8.7 but 80% of her hemoglobin was hemoglobin S. The last admission by mouth at that point she got exchange transfusion. At this point she did not get transfused but did much better. She was doing much better and transition to oral methadone prior to discharge home. She'll follow up with her primary care physician as well as OB/GYN.  Consults: None  Significant Diagnostic Studies: labs: Serial CBCs and CMP is done. Also had her baby followed closely by OB/GYN  Treatments: IV hydration and analgesia: Dilaudid  Discharge Exam: Blood pressure 112/71, pulse 108, temperature 98.6 F (37 C), temperature source Oral, resp. rate 16, height 5\' 1"  (1.549 m), weight 85.6 kg (188 lb 11.4 oz), last menstrual period 02/09/2015, SpO2 97 %, not currently breastfeeding. General appearance: alert, cooperative and no distress Head: Normocephalic, without obvious abnormality, atraumatic Back: symmetric, no curvature. ROM normal. No CVA tenderness. Resp:  clear to auscultation bilaterally Chest wall: no tenderness Cardio: regular rate and rhythm, S1, S2 normal, no murmur, click, rub or gallop GI: soft, non-tender; bowel sounds normal; no masses,  no organomegaly Extremities: extremities normal, atraumatic, no cyanosis or edema Pulses: 2+ and symmetric Skin: Skin color, texture, turgor normal. No rashes or lesions Neurologic: Grossly normal  Disposition: 01-Home or Self Care     Medication List    TAKE these medications        calcium carbonate 500 MG chewable tablet  Commonly known as:  TUMS - dosed in mg elemental calcium  Chew 2 tablets by mouth 2 (two) times daily as needed for indigestion or heartburn.     folic acid 1 MG tablet  Commonly known as:  FOLVITE  Take 1 mg by mouth daily.     methadone 5 MG tablet  Commonly known as:  DOLOPHINE  Take 1 tablet (5 mg total) by mouth daily.     oxyCODONE 15 MG immediate release tablet  Commonly known as:  ROXICODONE  Take 1 tablet (15 mg total) by mouth every 4 (four) hours as needed for pain.     prenatal multivitamin Tabs tablet  Take 1 tablet by mouth daily at 12 noon.     TYLENOL 325 MG tablet  Generic drug:  acetaminophen  Take 650 mg by mouth every 6 (six) hours as needed.         SignedBarbette Merino 10/10/2015, 10:08 AM  Time spent 32 minutes

## 2015-10-10 NOTE — Progress Notes (Signed)
Received report on pt.wbb

## 2015-10-10 NOTE — Progress Notes (Signed)
Patient discharged to home with friend, discharge instructions reviewed with patient who verbalized understanding. No new RX.

## 2015-10-13 ENCOUNTER — Encounter (HOSPITAL_COMMUNITY): Payer: Self-pay

## 2015-10-13 ENCOUNTER — Ambulatory Visit (HOSPITAL_COMMUNITY)
Admission: RE | Admit: 2015-10-13 | Discharge: 2015-10-13 | Disposition: A | Discharge status: Home / Self Care | Payer: Medicare Other | Source: Ambulatory Visit | Attending: Internal Medicine | Admitting: Internal Medicine

## 2015-10-13 ENCOUNTER — Other Ambulatory Visit (HOSPITAL_COMMUNITY): Payer: Self-pay | Admitting: Obstetrics and Gynecology

## 2015-10-13 VITALS — BP 112/73 | HR 98 | Wt 175.2 lb

## 2015-10-13 DIAGNOSIS — O0933 Supervision of pregnancy with insufficient antenatal care, third trimester: Secondary | ICD-10-CM

## 2015-10-13 DIAGNOSIS — A6000 Herpesviral infection of urogenital system, unspecified: Secondary | ICD-10-CM | POA: Diagnosis present

## 2015-10-13 DIAGNOSIS — D638 Anemia in other chronic diseases classified elsewhere: Secondary | ICD-10-CM | POA: Diagnosis present

## 2015-10-13 DIAGNOSIS — Z833 Family history of diabetes mellitus: Secondary | ICD-10-CM

## 2015-10-13 DIAGNOSIS — O99013 Anemia complicating pregnancy, third trimester: Secondary | ICD-10-CM

## 2015-10-13 DIAGNOSIS — Z832 Family history of diseases of the blood and blood-forming organs and certain disorders involving the immune mechanism: Secondary | ICD-10-CM

## 2015-10-13 DIAGNOSIS — Z91018 Allergy to other foods: Secondary | ICD-10-CM | POA: Diagnosis not present

## 2015-10-13 DIAGNOSIS — Z79899 Other long term (current) drug therapy: Secondary | ICD-10-CM

## 2015-10-13 DIAGNOSIS — O99323 Drug use complicating pregnancy, third trimester: Secondary | ICD-10-CM

## 2015-10-13 DIAGNOSIS — Z91040 Latex allergy status: Secondary | ICD-10-CM

## 2015-10-13 DIAGNOSIS — O98313 Other infections with a predominantly sexual mode of transmission complicating pregnancy, third trimester: Secondary | ICD-10-CM | POA: Diagnosis present

## 2015-10-13 DIAGNOSIS — O99019 Anemia complicating pregnancy, unspecified trimester: Secondary | ICD-10-CM

## 2015-10-13 DIAGNOSIS — D5700 Hb-SS disease with crisis, unspecified: Secondary | ICD-10-CM | POA: Diagnosis not present

## 2015-10-13 DIAGNOSIS — Z3A35 35 weeks gestation of pregnancy: Secondary | ICD-10-CM

## 2015-10-13 DIAGNOSIS — D571 Sickle-cell disease without crisis: Secondary | ICD-10-CM

## 2015-10-13 DIAGNOSIS — F1920 Other psychoactive substance dependence, uncomplicated: Secondary | ICD-10-CM

## 2015-10-13 DIAGNOSIS — F329 Major depressive disorder, single episode, unspecified: Secondary | ICD-10-CM | POA: Diagnosis not present

## 2015-10-13 DIAGNOSIS — O98513 Other viral diseases complicating pregnancy, third trimester: Secondary | ICD-10-CM

## 2015-10-13 DIAGNOSIS — Z3A36 36 weeks gestation of pregnancy: Secondary | ICD-10-CM

## 2015-10-13 DIAGNOSIS — B009 Herpesviral infection, unspecified: Secondary | ICD-10-CM

## 2015-10-13 DIAGNOSIS — O98519 Other viral diseases complicating pregnancy, unspecified trimester: Secondary | ICD-10-CM

## 2015-10-13 DIAGNOSIS — Z87891 Personal history of nicotine dependence: Secondary | ICD-10-CM | POA: Diagnosis not present

## 2015-10-13 DIAGNOSIS — O99320 Drug use complicating pregnancy, unspecified trimester: Secondary | ICD-10-CM

## 2015-10-13 DIAGNOSIS — D72829 Elevated white blood cell count, unspecified: Secondary | ICD-10-CM | POA: Diagnosis present

## 2015-10-13 DIAGNOSIS — K219 Gastro-esophageal reflux disease without esophagitis: Secondary | ICD-10-CM | POA: Diagnosis present

## 2015-10-13 DIAGNOSIS — Z79891 Long term (current) use of opiate analgesic: Secondary | ICD-10-CM | POA: Diagnosis not present

## 2015-10-13 DIAGNOSIS — O0990 Supervision of high risk pregnancy, unspecified, unspecified trimester: Secondary | ICD-10-CM

## 2015-10-13 DIAGNOSIS — IMO0002 Reserved for concepts with insufficient information to code with codable children: Secondary | ICD-10-CM

## 2015-10-13 DIAGNOSIS — D57219 Sickle-cell/Hb-C disease with crisis, unspecified: Secondary | ICD-10-CM | POA: Diagnosis not present

## 2015-10-13 DIAGNOSIS — Z8249 Family history of ischemic heart disease and other diseases of the circulatory system: Secondary | ICD-10-CM

## 2015-10-13 DIAGNOSIS — M79606 Pain in leg, unspecified: Secondary | ICD-10-CM | POA: Diagnosis present

## 2015-10-14 ENCOUNTER — Encounter (HOSPITAL_COMMUNITY): Payer: Self-pay

## 2015-10-14 ENCOUNTER — Emergency Department (HOSPITAL_COMMUNITY)
Admission: EM | Admit: 2015-10-14 | Discharge: 2015-10-15 | Disposition: A | Discharge status: Home / Self Care | Payer: Medicare Other | Source: Home / Self Care | Attending: Emergency Medicine | Admitting: Emergency Medicine

## 2015-10-14 DIAGNOSIS — M79606 Pain in leg, unspecified: Secondary | ICD-10-CM | POA: Diagnosis not present

## 2015-10-14 DIAGNOSIS — Z91040 Latex allergy status: Secondary | ICD-10-CM

## 2015-10-14 DIAGNOSIS — Z79891 Long term (current) use of opiate analgesic: Secondary | ICD-10-CM | POA: Insufficient documentation

## 2015-10-14 DIAGNOSIS — Z79899 Other long term (current) drug therapy: Secondary | ICD-10-CM | POA: Insufficient documentation

## 2015-10-14 DIAGNOSIS — D638 Anemia in other chronic diseases classified elsewhere: Secondary | ICD-10-CM | POA: Diagnosis not present

## 2015-10-14 DIAGNOSIS — Z3A36 36 weeks gestation of pregnancy: Secondary | ICD-10-CM | POA: Insufficient documentation

## 2015-10-14 DIAGNOSIS — K219 Gastro-esophageal reflux disease without esophagitis: Secondary | ICD-10-CM | POA: Diagnosis not present

## 2015-10-14 DIAGNOSIS — O99013 Anemia complicating pregnancy, third trimester: Secondary | ICD-10-CM | POA: Diagnosis not present

## 2015-10-14 DIAGNOSIS — O98313 Other infections with a predominantly sexual mode of transmission complicating pregnancy, third trimester: Secondary | ICD-10-CM | POA: Diagnosis not present

## 2015-10-14 DIAGNOSIS — D57219 Sickle-cell/Hb-C disease with crisis, unspecified: Secondary | ICD-10-CM | POA: Diagnosis not present

## 2015-10-14 DIAGNOSIS — Z87891 Personal history of nicotine dependence: Secondary | ICD-10-CM

## 2015-10-14 DIAGNOSIS — D5700 Hb-SS disease with crisis, unspecified: Secondary | ICD-10-CM | POA: Insufficient documentation

## 2015-10-14 DIAGNOSIS — O9913 Other diseases of the blood and blood-forming organs and certain disorders involving the immune mechanism complicating the puerperium: Secondary | ICD-10-CM

## 2015-10-14 NOTE — ED Notes (Signed)
Pt complains of back pain for two days

## 2015-10-15 ENCOUNTER — Encounter (HOSPITAL_COMMUNITY): Payer: Self-pay | Admitting: *Deleted

## 2015-10-15 ENCOUNTER — Inpatient Hospital Stay (HOSPITAL_COMMUNITY)
Admission: AD | Admit: 2015-10-15 | Discharge: 2015-10-18 | DRG: 781 | Disposition: A | Discharge status: Home / Self Care | Payer: Medicare Other | Source: Ambulatory Visit | Attending: Internal Medicine | Admitting: Internal Medicine

## 2015-10-15 ENCOUNTER — Encounter (HOSPITAL_COMMUNITY): Payer: Self-pay | Admitting: Emergency Medicine

## 2015-10-15 DIAGNOSIS — O99013 Anemia complicating pregnancy, third trimester: Secondary | ICD-10-CM | POA: Diagnosis not present

## 2015-10-15 DIAGNOSIS — O99323 Drug use complicating pregnancy, third trimester: Secondary | ICD-10-CM | POA: Diagnosis not present

## 2015-10-15 DIAGNOSIS — IMO0002 Reserved for concepts with insufficient information to code with codable children: Secondary | ICD-10-CM

## 2015-10-15 DIAGNOSIS — O99320 Drug use complicating pregnancy, unspecified trimester: Secondary | ICD-10-CM

## 2015-10-15 DIAGNOSIS — Z3A39 39 weeks gestation of pregnancy: Secondary | ICD-10-CM | POA: Diagnosis not present

## 2015-10-15 DIAGNOSIS — O0990 Supervision of high risk pregnancy, unspecified, unspecified trimester: Secondary | ICD-10-CM

## 2015-10-15 DIAGNOSIS — Z3A38 38 weeks gestation of pregnancy: Secondary | ICD-10-CM | POA: Diagnosis not present

## 2015-10-15 DIAGNOSIS — O99019 Anemia complicating pregnancy, unspecified trimester: Secondary | ICD-10-CM

## 2015-10-15 DIAGNOSIS — D638 Anemia in other chronic diseases classified elsewhere: Secondary | ICD-10-CM

## 2015-10-15 DIAGNOSIS — O98519 Other viral diseases complicating pregnancy, unspecified trimester: Secondary | ICD-10-CM

## 2015-10-15 DIAGNOSIS — D5700 Hb-SS disease with crisis, unspecified: Secondary | ICD-10-CM | POA: Diagnosis not present

## 2015-10-15 DIAGNOSIS — D571 Sickle-cell disease without crisis: Secondary | ICD-10-CM

## 2015-10-15 DIAGNOSIS — B009 Herpesviral infection, unspecified: Secondary | ICD-10-CM

## 2015-10-15 DIAGNOSIS — O321XX0 Maternal care for breech presentation, not applicable or unspecified: Secondary | ICD-10-CM | POA: Diagnosis not present

## 2015-10-15 DIAGNOSIS — O98513 Other viral diseases complicating pregnancy, third trimester: Secondary | ICD-10-CM | POA: Diagnosis not present

## 2015-10-15 LAB — URINALYSIS, ROUTINE W REFLEX MICROSCOPIC
BILIRUBIN URINE: NEGATIVE
BILIRUBIN URINE: NEGATIVE
GLUCOSE, UA: NEGATIVE mg/dL
GLUCOSE, UA: NEGATIVE mg/dL
Hgb urine dipstick: NEGATIVE
Hgb urine dipstick: NEGATIVE
KETONES UR: NEGATIVE mg/dL
Ketones, ur: NEGATIVE mg/dL
LEUKOCYTES UA: NEGATIVE
Leukocytes, UA: NEGATIVE
NITRITE: NEGATIVE
NITRITE: NEGATIVE
PH: 5.5 (ref 5.0–8.0)
PH: 6.5 (ref 5.0–8.0)
Protein, ur: NEGATIVE mg/dL
Protein, ur: NEGATIVE mg/dL
SPECIFIC GRAVITY, URINE: 1.012 (ref 1.005–1.030)

## 2015-10-15 LAB — WET PREP, GENITAL
Clue Cells Wet Prep HPF POC: NONE SEEN
Sperm: NONE SEEN
TRICH WET PREP: NONE SEEN
YEAST WET PREP: NONE SEEN

## 2015-10-15 LAB — CBC WITH DIFFERENTIAL/PLATELET
BASOS ABS: 0 10*3/uL (ref 0.0–0.1)
BASOS PCT: 0 %
Basophils Absolute: 0 10*3/uL (ref 0.0–0.1)
Basophils Relative: 0 %
EOS ABS: 0.2 10*3/uL (ref 0.0–0.7)
EOS PCT: 2 %
Eosinophils Absolute: 0.1 10*3/uL (ref 0.0–0.7)
Eosinophils Relative: 1 %
HCT: 25.7 % — ABNORMAL LOW (ref 36.0–46.0)
HEMATOCRIT: 23.6 % — AB (ref 36.0–46.0)
HEMOGLOBIN: 8.3 g/dL — AB (ref 12.0–15.0)
Hemoglobin: 8.7 g/dL — ABNORMAL LOW (ref 12.0–15.0)
LYMPHS ABS: 2.1 10*3/uL (ref 0.7–4.0)
LYMPHS PCT: 14 %
LYMPHS PCT: 14 %
Lymphs Abs: 1.8 10*3/uL (ref 0.7–4.0)
MCH: 29.4 pg (ref 26.0–34.0)
MCH: 30.4 pg (ref 26.0–34.0)
MCHC: 33.9 g/dL (ref 30.0–36.0)
MCHC: 35.2 g/dL (ref 30.0–36.0)
MCV: 86.4 fL (ref 78.0–100.0)
MCV: 86.8 fL (ref 78.0–100.0)
MONO ABS: 1.5 10*3/uL — AB (ref 0.1–1.0)
MONOS PCT: 10 %
MONOS PCT: 9 %
Monocytes Absolute: 1.1 10*3/uL — ABNORMAL HIGH (ref 0.1–1.0)
NEUTROS ABS: 9.9 10*3/uL — AB (ref 1.7–7.7)
NEUTROS PCT: 76 %
Neutro Abs: 11.2 10*3/uL — ABNORMAL HIGH (ref 1.7–7.7)
Neutrophils Relative %: 74 %
PLATELETS: 411 10*3/uL — AB (ref 150–400)
Platelets: 380 10*3/uL (ref 150–400)
RBC: 2.73 MIL/uL — ABNORMAL LOW (ref 3.87–5.11)
RBC: 2.96 MIL/uL — ABNORMAL LOW (ref 3.87–5.11)
RDW: 19.7 % — AB (ref 11.5–15.5)
RDW: 20.3 % — ABNORMAL HIGH (ref 11.5–15.5)
WBC: 13.1 10*3/uL — ABNORMAL HIGH (ref 4.0–10.5)
WBC: 15 10*3/uL — AB (ref 4.0–10.5)

## 2015-10-15 LAB — RETICULOCYTES
RBC.: 2.77 MIL/uL — AB (ref 3.87–5.11)
RBC.: 2.96 MIL/uL — AB (ref 3.87–5.11)
RETIC CT PCT: 19.1 % — AB (ref 0.4–3.1)
RETIC CT PCT: 19.2 % — AB (ref 0.4–3.1)
Retic Count, Absolute: 529.1 10*3/uL — ABNORMAL HIGH (ref 19.0–186.0)
Retic Count, Absolute: 568.3 10*3/uL — ABNORMAL HIGH (ref 19.0–186.0)

## 2015-10-15 LAB — PREPARE RBC (CROSSMATCH)

## 2015-10-15 LAB — CBC
HEMATOCRIT: 26.5 % — AB (ref 36.0–46.0)
Hemoglobin: 8.9 g/dL — ABNORMAL LOW (ref 12.0–15.0)
MCH: 29.3 pg (ref 26.0–34.0)
MCHC: 33.6 g/dL (ref 30.0–36.0)
MCV: 87.2 fL (ref 78.0–100.0)
Platelets: 421 10*3/uL — ABNORMAL HIGH (ref 150–400)
RBC: 3.04 MIL/uL — ABNORMAL LOW (ref 3.87–5.11)
RDW: 20.2 % — AB (ref 11.5–15.5)
WBC: 14.8 10*3/uL — ABNORMAL HIGH (ref 4.0–10.5)

## 2015-10-15 LAB — BASIC METABOLIC PANEL
Anion gap: 6 (ref 5–15)
CO2: 22 mmol/L (ref 22–32)
CREATININE: 0.45 mg/dL (ref 0.44–1.00)
Calcium: 8.3 mg/dL — ABNORMAL LOW (ref 8.9–10.3)
Chloride: 109 mmol/L (ref 101–111)
GFR calc non Af Amer: >60 mL/min (ref >60)
Glucose, Bld: 85 mg/dL (ref 65–99)
Potassium: 3.7 mmol/L (ref 3.5–5.1)
SODIUM: 137 mmol/L (ref 135–145)

## 2015-10-15 LAB — I-STAT CHEM 8, ED
BUN: 4 mg/dL — ABNORMAL LOW (ref 6–20)
CALCIUM ION: 1.15 mmol/L (ref 1.12–1.23)
CHLORIDE: 106 mmol/L (ref 101–111)
Creatinine, Ser: 0.5 mg/dL (ref 0.44–1.00)
GLUCOSE: 92 mg/dL (ref 65–99)
HCT: 27 % — ABNORMAL LOW (ref 36.0–46.0)
Hemoglobin: 9.2 g/dL — ABNORMAL LOW (ref 12.0–15.0)
Potassium: 3.8 mmol/L (ref 3.5–5.1)
SODIUM: 139 mmol/L (ref 135–145)
TCO2: 21 mmol/L (ref 0–100)

## 2015-10-15 LAB — LACTATE DEHYDROGENASE: LDH: 165 U/L (ref 98–192)

## 2015-10-15 MED ORDER — SODIUM CHLORIDE 0.9 % IV BOLUS (SEPSIS)
1000.0000 mL | Freq: Once | INTRAVENOUS | Status: AC
  Administered 2015-10-15: 1000 mL via INTRAVENOUS

## 2015-10-15 MED ORDER — DIPHENHYDRAMINE HCL 25 MG PO CAPS
25.0000 mg | ORAL_CAPSULE | ORAL | Status: DC | PRN
  Administered 2015-10-16: 50 mg via ORAL
  Filled 2015-10-15: qty 2

## 2015-10-15 MED ORDER — CALCIUM CARBONATE ANTACID 500 MG PO CHEW: 2.0000 | CHEWABLE_TABLET | Freq: Two times a day (BID) | ORAL | Status: DC | PRN

## 2015-10-15 MED ORDER — HYDROMORPHONE HCL 2 MG/ML IJ SOLN
2.0000 mg | Freq: Once | INTRAMUSCULAR | Status: AC
  Administered 2015-10-15: 2 mg via INTRAVENOUS
  Filled 2015-10-15: qty 1

## 2015-10-15 MED ORDER — SODIUM CHLORIDE 0.9% FLUSH: 9.0000 mL | INTRAVENOUS | Status: DC | PRN

## 2015-10-15 MED ORDER — HYDROMORPHONE HCL 2 MG/ML IJ SOLN
2.5000 mg | Freq: Once | INTRAMUSCULAR | Status: AC
  Administered 2015-10-15: 2.5 mg via INTRAVENOUS
  Filled 2015-10-15: qty 2

## 2015-10-15 MED ORDER — NALOXONE HCL 0.4 MG/ML IJ SOLN: 0.4000 mg | INTRAMUSCULAR | Status: DC | PRN

## 2015-10-15 MED ORDER — ONDANSETRON HCL 4 MG/2ML IJ SOLN
4.0000 mg | Freq: Once | INTRAMUSCULAR | Status: AC
  Administered 2015-10-15: 4 mg via INTRAVENOUS
  Filled 2015-10-15: qty 2

## 2015-10-15 MED ORDER — ONDANSETRON HCL 4 MG/2ML IJ SOLN: 4.0000 mg | Freq: Four times a day (QID) | INTRAMUSCULAR | Status: DC | PRN

## 2015-10-15 MED ORDER — POLYETHYLENE GLYCOL 3350 17 G PO PACK: 17.0000 g | PACK | Freq: Every day | ORAL | Status: DC | PRN

## 2015-10-15 MED ORDER — DIPHENHYDRAMINE HCL 25 MG PO CAPS
25.0000 mg | ORAL_CAPSULE | Freq: Once | ORAL | Status: AC
  Administered 2015-10-15: 25 mg via ORAL
  Filled 2015-10-15: qty 1

## 2015-10-15 MED ORDER — PRENATAL MULTIVITAMIN CH
1.0000 | ORAL_TABLET | Freq: Every day | ORAL | Status: DC
  Administered 2015-10-15 – 2015-10-18 (×4): 1 via ORAL
  Filled 2015-10-15 (×6): qty 1

## 2015-10-15 MED ORDER — HYDROMORPHONE HCL 2 MG/ML IJ SOLN
2.0000 mg | INTRAMUSCULAR | Status: DC | PRN
  Administered 2015-10-16 – 2015-10-18 (×20): 2 mg via INTRAVENOUS
  Filled 2015-10-15 (×20): qty 1

## 2015-10-15 MED ORDER — ONDANSETRON 4 MG PO TBDP
4.0000 mg | ORAL_TABLET | Freq: Once | ORAL | Status: AC
  Administered 2015-10-15: 4 mg via ORAL
  Filled 2015-10-15: qty 1

## 2015-10-15 MED ORDER — HYDROMORPHONE HCL 1 MG/ML IJ SOLN
1.0000 mg | Freq: Once | INTRAMUSCULAR | Status: AC
  Administered 2015-10-15: 1 mg via INTRAVENOUS
  Filled 2015-10-15: qty 1

## 2015-10-15 MED ORDER — SODIUM CHLORIDE 0.9 % IV SOLN
25.0000 mg | INTRAVENOUS | Status: DC | PRN
  Administered 2015-10-17 – 2015-10-18 (×5): 25 mg via INTRAVENOUS
  Filled 2015-10-15 (×11): qty 0.5

## 2015-10-15 MED ORDER — HYDROMORPHONE 1 MG/ML IV SOLN
INTRAVENOUS | Status: DC
  Administered 2015-10-15: 20:00:00 via INTRAVENOUS
  Administered 2015-10-16: 7.7 mg via INTRAVENOUS
  Administered 2015-10-16: 6.3 mg via INTRAVENOUS
  Administered 2015-10-16: 1.4 mg via INTRAVENOUS
  Administered 2015-10-16: 14:00:00 via INTRAVENOUS
  Administered 2015-10-16: 4.9 mg via INTRAVENOUS
  Administered 2015-10-16: 7 mg via INTRAVENOUS
  Administered 2015-10-16: 6.3 mg via INTRAVENOUS
  Administered 2015-10-16: 4.2 mg via INTRAVENOUS
  Administered 2015-10-17: 21:00:00 via INTRAVENOUS
  Administered 2015-10-17: 6.3 mg via INTRAVENOUS
  Administered 2015-10-17: 0.4 mg via INTRAVENOUS
  Administered 2015-10-17: 08:00:00 via INTRAVENOUS
  Administered 2015-10-17: 7 mg via INTRAVENOUS
  Administered 2015-10-17: 9.19 mg via INTRAVENOUS
  Administered 2015-10-17: 10.5 mg via INTRAVENOUS
  Administered 2015-10-18: 11:00:00 via INTRAVENOUS
  Administered 2015-10-18: 12.6 mg via INTRAVENOUS
  Administered 2015-10-18: 8.35 mg via INTRAVENOUS
  Administered 2015-10-18 (×2): 5.6 mg via INTRAVENOUS
  Filled 2015-10-15 (×5): qty 25

## 2015-10-15 MED ORDER — DEXTROSE-NACL 5-0.45 % IV SOLN
INTRAVENOUS | Status: DC
  Administered 2015-10-15 – 2015-10-18 (×11): via INTRAVENOUS

## 2015-10-15 MED ORDER — HYDROMORPHONE HCL 2 MG/ML IJ SOLN
2.0000 mg | INTRAMUSCULAR | Status: AC
  Administered 2015-10-15 – 2015-10-16 (×8): 2 mg via INTRAVENOUS
  Filled 2015-10-15 (×8): qty 1

## 2015-10-15 MED ORDER — SENNOSIDES-DOCUSATE SODIUM 8.6-50 MG PO TABS
1.0000 | ORAL_TABLET | Freq: Two times a day (BID) | ORAL | Status: DC
  Administered 2015-10-15 – 2015-10-18 (×6): 1 via ORAL
  Filled 2015-10-15 (×6): qty 1

## 2015-10-15 MED ORDER — SODIUM CHLORIDE 0.9 % IV SOLN
Freq: Once | INTRAVENOUS | Status: AC
  Administered 2015-10-15: 21:00:00 via INTRAVENOUS

## 2015-10-15 MED ORDER — FOLIC ACID 1 MG PO TABS
1.0000 mg | ORAL_TABLET | Freq: Every day | ORAL | Status: DC
  Administered 2015-10-15 – 2015-10-18 (×4): 1 mg via ORAL
  Filled 2015-10-15 (×4): qty 1

## 2015-10-15 MED ORDER — HYDROMORPHONE HCL 1 MG/ML IJ SOLN
0.5000 mg | Freq: Once | INTRAMUSCULAR | Status: AC
  Administered 2015-10-15: 0.5 mg via INTRAVENOUS
  Filled 2015-10-15: qty 1

## 2015-10-15 MED ORDER — ENOXAPARIN SODIUM 40 MG/0.4ML ~~LOC~~ SOLN
40.0000 mg | SUBCUTANEOUS | Status: DC
  Administered 2015-10-15: 40 mg via SUBCUTANEOUS
  Filled 2015-10-15 (×2): qty 0.4

## 2015-10-15 MED ORDER — LACTATED RINGERS IV SOLN
INTRAVENOUS | Status: DC
  Administered 2015-10-15: 12:00:00 via INTRAVENOUS

## 2015-10-15 MED ORDER — METHADONE HCL 5 MG PO TABS
5.0000 mg | ORAL_TABLET | Freq: Every day | ORAL | Status: DC
  Administered 2015-10-16 – 2015-10-18 (×3): 5 mg via ORAL
  Filled 2015-10-15 (×3): qty 1

## 2015-10-15 NOTE — MAU Provider Note (Signed)
History     CSN: [id number]  Arrival date and time: 10/15/15 1013   First Provider Initiated Contact with Patient 10/15/15 1121      Chief Complaint  Patient presents with  . Contractions   HPI Patient is 25 y.o. [id number] [redacted]w[redacted]d here with complaints of   #Leaking fluid: Reports increased creamy discharge starting at 10 PM. Reports no gush of fluid, no continued watery fluid. Only creamy. Reports some contractions/labor pain closer  +FM, denies LOF, VB, contractions, vaginal discharge.   #Sickle Cell Pain- in legs and back. Reports "this feels like a crisis."   OB History    Gravida Para Term Preterm AB TAB SAB Ectopic Multiple Living   3 1 1  1  1   0 1      Obstetric Comments   Miscarried in October 2012 at about 7 weeks      Past Medical History  Diagnosis Date  . Miscarriage 03/22/2011    Pt reports 2 miscarriages.  . Depression 01/06/2011  . GERD (gastroesophageal reflux disease) 02/17/2011  . Trichotillomania     h/o  . Blood transfusion     "lots"  . Sickle cell anemia with crisis ([place])   . Exertional dyspnea     "sometimes"  . Migraines 11/08/11    "@ least twice/month"  . Chronic back pain     "very severe; have knot in my back; from tight muscle; take RX and exercise for it"  . Mood swings ([place]) 11/08/11    "I go back and forth; real bad"  . Sickle cell anemia ([name])   . Blood transfusion without reported diagnosis   . Genital HSV     Past Surgical History  Procedure Laterality Date  . Cholecystectomy  05/2010  . Dilation and curettage of uterus  02/20/11    S/P miscarriage    Family History  Problem Relation Age of Onset  . Diabetes Maternal Grandmother   . Diabetes Paternal Grandmother   . Hypertension Paternal Grandmother   . Diabetes Maternal Grandfather   . Sickle cell trait Mother   . Sickle cell trait Father     Social History  Substance Use Topics  . Smoking status: Former Smoker -- 0.25 packs/day for 1 years    Types: Cigarettes   Quit date: 03/25/2013  . Smokeless tobacco: Never Used  . Alcohol Use: No     Comment: pt states she quit marijuan in May 2013. Rare ETOH, + cigarettes.  She is enrolled in school    Allergies:  Allergies  Allergen Reactions  . Carrot [Daucus Carota] Hives, Swelling and Rash    Dietary:  Please do not send any form of carrot to patient  . Carrot Oil Hives and Swelling  . Latex Rash    *powdered Latex*     Prescriptions prior to admission  Medication Sig Dispense Refill Last Dose  . acetaminophen (TYLENOL) 325 MG tablet Take 650 mg by mouth every 6 (six) hours as needed.   10/14/2015 at Unknown time  . calcium carbonate (TUMS - DOSED IN MG ELEMENTAL CALCIUM) 500 MG chewable tablet Chew 2 tablets by mouth 2 (two) times daily as needed for indigestion or heartburn.   10/13/2015 at Unknown time  . folic acid (FOLVITE) 1 MG tablet Take 1 mg by mouth daily.   10/14/2015 at Unknown time  . methadone (DOLOPHINE) 5 MG tablet Take 1 tablet (5 mg total) by mouth daily. 30 tablet 0 10/14/2015 at Unknown time  . oxyCODONE (  ROXICODONE) 15 MG immediate release tablet Take 1 tablet (15 mg total) by mouth every 4 (four) hours as needed for pain. 90 tablet 0 10/14/2015 at Unknown time  . Prenatal Vit-Fe Fumarate-FA (PRENATAL MULTIVITAMIN) TABS tablet Take 1 tablet by mouth daily at 12 noon.   10/14/2015 at Unknown time    Review of Systems  Constitutional: Negative for fever and chills.  Eyes: Negative for blurred vision and double vision.  Respiratory: Negative for cough and shortness of breath.   Cardiovascular: Negative for chest pain and orthopnea.  Gastrointestinal: Negative for nausea and vomiting.  Genitourinary: Negative for dysuria, frequency and flank pain.  Musculoskeletal: Positive for myalgias and back pain. Negative for joint pain. Neck pain: in thighs.  Skin: Negative for rash.  Neurological: Negative for dizziness, tingling, weakness and headaches.  Endo/Heme/Allergies: Does not  bruise/bleed easily.  Psychiatric/Behavioral: Negative for depression and suicidal ideas. The patient is not nervous/anxious.    Physical Exam   Blood pressure 115/62, pulse 100, temperature 98.3 F (36.8 C), temperature source Oral, resp. rate 16, last menstrual period 02/09/2015, SpO2 100 %, not currently breastfeeding.  Physical Exam  Nursing note and vitals reviewed. Constitutional: She is oriented to person, place, and time. She appears well-developed and well-nourished. No distress.  Pregnant female  HENT:  Head: Normocephalic and atraumatic.  Eyes: Conjunctivae are normal. No scleral icterus.  Neck: Normal range of motion. Neck supple.  Cardiovascular: Normal rate and intact distal pulses.   Respiratory: Effort normal. She exhibits no tenderness.  GI: Soft. There is no tenderness. There is no rebound and no guarding.  Gravid  Genitourinary: Vagina normal.  Musculoskeletal: Normal range of motion. She exhibits no edema.  Neurological: She is alert and oriented to person, place, and time.  Skin: Skin is warm and dry. No rash noted.  Psychiatric: She has a normal mood and affect.   Dilation: Fingertip Exam by:: Dr Ernestina Patches  MAU Course  Procedures  MDM  NST 140/mod/+accels, no decels Toco: q10 minutes  11:43 AM reviewed FERN slide, negative for ferning. + clumped epithelium  Results for orders placed or performed during the hospital encounter of 10/15/15 (from the past 24 hour(s))  Urinalysis, Routine w reflex microscopic (not at [facility])     Status: Abnormal   Collection Time: 10/15/15 10:45 AM  Result Value Ref Range   Color, Urine YELLOW YELLOW   APPearance CLEAR CLEAR   Specific Gravity, Urine <1.005 (L) 1.005 - 1.030   pH 5.5 5.0 - 8.0   Glucose, UA NEGATIVE NEGATIVE mg/dL   Hgb urine dipstick NEGATIVE NEGATIVE   Bilirubin Urine NEGATIVE NEGATIVE   Ketones, ur NEGATIVE NEGATIVE mg/dL   Protein, ur NEGATIVE NEGATIVE mg/dL   Nitrite NEGATIVE NEGATIVE    Leukocytes, UA NEGATIVE NEGATIVE   11:49 AM Will give LR bolus, Dilaudid, benadryl and zofran for sickle cell pain crisis.    Assessment and Plan   Edia Cirigliano Kurihara is a 25 y.o. G3P1011 at [redacted]w[redacted]d   #Concern for ROM: Ruled out ROM with pelvic exam. Not in labor #Sickle cell pain: I handed over care to the oncoming LD team, specifically Kathrine Haddock.    Juanita Craver [facility] 10/15/2015, 11:23 AM

## 2015-10-15 NOTE — ED Notes (Signed)
Patient continues to talk on the phone while trying to assess pain level.

## 2015-10-15 NOTE — Discharge Instructions (Signed)
Sickle Cell Anemia, Adult Sickle cell anemia is a condition in which red blood cells have an abnormal "sickle" shape. This abnormal shape shortens the cells' life span, which results in a lower than normal concentration of red blood cells in the blood. The sickle shape also causes the cells to clump together and block free blood flow through the blood vessels. As a result, the tissues and organs of the body do not receive enough oxygen. Sickle cell anemia causes organ damage and pain and increases the risk of infection. CAUSES  Sickle cell anemia is a genetic disorder. Those who receive two copies of the gene have the condition, and those who receive one copy have the trait. RISK FACTORS The sickle cell gene is most common in people whose families originated in [place]. Other areas of the globe where sickle cell trait occurs include the Mediterranean, [place] and [place], the [place], and the [place].  SIGNS AND SYMPTOMS  Pain, especially in the extremities, back, chest, or abdomen (common). The pain may start suddenly or may develop following an illness, especially if there is dehydration. Pain can also occur due to overexertion or exposure to extreme temperature changes.  Frequent severe bacterial infections, especially certain types of pneumonia and meningitis.  Pain and swelling in the hands and feet.  Decreased activity.   Loss of appetite.   Change in behavior.  Headaches.  Seizures.  Shortness of breath or difficulty breathing.  Vision changes.  Skin ulcers. Those with the trait may not have symptoms or they may have mild symptoms.  DIAGNOSIS  Sickle cell anemia is diagnosed with blood tests that demonstrate the genetic trait. It is often diagnosed during the newborn period, due to mandatory testing nationwide. A variety of blood tests, X-rays, CT scans, MRI scans, ultrasounds, and lung function tests may also be done to monitor the condition. TREATMENT  Sickle  cell anemia may be treated with:  Medicines. You may be given pain medicines, antibiotic medicines (to treat and prevent infections) or medicines to increase the production of certain types of hemoglobin.  Fluids.  Oxygen.  Blood transfusions. HOME CARE INSTRUCTIONS   Drink enough fluid to keep your urine clear or pale yellow. Increase your fluid intake in hot weather and during exercise.  Do not smoke. Smoking lowers oxygen levels in the blood.   Only take over-the-counter or prescription medicines for pain, fever, or discomfort as directed by your health care provider.  Take antibiotics as directed by your health care provider. Make sure you finish them it even if you start to feel better.   Take supplements as directed by your health care provider.   Consider wearing a medical alert bracelet. This tells anyone caring for you in an emergency of your condition.   When traveling, keep your medical information, health care provider's names, and the medicines you take with you at all times.   If you develop a fever, do not take medicines to reduce the fever right away. This could cover up a problem that is developing. Notify your health care provider.  Keep all follow-up appointments with your health care provider. Sickle cell anemia requires regular medical care. SEEK MEDICAL CARE IF: You have a fever. SEEK IMMEDIATE MEDICAL CARE IF:   You feel dizzy or faint.   You have new abdominal pain, especially on the left side near the stomach area.   You develop a persistent, often uncomfortable and painful penile erection (priapism). If this is not treated immediately it   will lead to impotence.   You have numbness your arms or legs or you have a hard time moving them.   You have a hard time with speech.   You have a fever or persistent symptoms for more than 2-3 days.   You have a fever and your symptoms suddenly get worse.   You have signs or symptoms of infection.  These include:   Chills.   Abnormal tiredness (lethargy).   Irritability.   Poor eating.   Vomiting.   You develop pain that is not helped with medicine.   You develop shortness of breath.  You have pain in your chest.   You are coughing up pus-like or bloody sputum.   You develop a stiff neck.  Your feet or hands swell or have pain.  Your abdomen appears bloated.  You develop joint pain. MAKE SURE YOU:  Understand these instructions.   This information is not intended to replace advice given to you by your health care provider. Make sure you discuss any questions you have with your health care provider.   Document Released: 08/10/2005 Document Revised: 05/23/2014 Document Reviewed: 12/12/2012 Elsevier Interactive Patient Education 2016 Elsevier Inc.  

## 2015-10-15 NOTE — ED Notes (Signed)
Per [facility] Patient received Dilaudid 2 mg IV x 3 per [facility]. Patient stated, "That nurse over there did not give me my last dose of pain medicine because she did not push it fast. Patient informed that she had a running Iv and the medication went through. Patient would not get off of her cell phone to be assessed. IV infusing without difficulty.

## 2015-10-15 NOTE — Progress Notes (Signed)
[place] ED and talked with Dr. Tyrone Nine regarding transfer of patient to [facility] for pain management. Recommended calling [facility] Cell clinic for possible management.  Called S[facility] Cell clinic at [phone] informed do not accept patients after 1 pm.  Dr. Tyrone Nine called again and will accept patient transfer.

## 2015-10-15 NOTE — Progress Notes (Signed)
MAU OB Note  Chart reviewed.  Patient here for SROM rule out and leg pain @ 35/3 wks. OB evaluation with negative toco, SVE and SROM evaluatiion, and rNST. AV VS normal. CBC stable, u/a negative. D/w Dr. Zigmund Daniel @ [facility] and her [facility] team and given presentation, patient doesn't warrant admission for crises/pain. Ultimate solution is exchange transfusions which she hasn't been compliant with. She set her up for T&S today at the [facility] and ET tomorrow. I d/w Ms. Bota the situation, and I told her our recommendations. She denies any OB s/s and no pulmonary s/s. At home, she is currently on oxycodone 15mg  q6h prn, per the patient and she is reluctant to take more than that; last dose this morning. I told her to call her primary team to ask about an increased dose to hold her till tomorrow, which she is amenable to. She is inquiring about one more dose of IV pain medication (dilaudid 2mg  IV given at 1230pm) and I told her that is fine. Also told patient to please keep all outpatient OB appts so we can start to pin down her delivery plan. Patient amenable to plan.  Durene Romans MD Attending Center for [employer] farm manager)

## 2015-10-15 NOTE — Progress Notes (Signed)
Called ED to get report, placed on hold for a long time, will wait for ED to call floor.

## 2015-10-15 NOTE — Progress Notes (Signed)
While preparing discharge information patient states that she is unable to be discharged and that she "knows when having a crisis" and desires to be transferred to [facility] for management.  Discussed with Dr. Ilda Basset > transfer patient to [facility] for management.

## 2015-10-15 NOTE — ED Notes (Signed)
Patient requested writer to get a Normal saline flush and push it fast because the nurse at [facility] did not do that and she is sure she id not get it because she did not feel it like she normally does. Writer explained to the patient that she had NS infusing and the medicine did infuse and it did not infuse like a push medication. Patient stated, "I know I did not get it because I didn't feel it."

## 2015-10-15 NOTE — ED Provider Notes (Signed)
CSN: [id number]     Arrival date & time 10/15/15  1013 History   First MD Initiated Contact with Patient 10/15/15 1649     Chief Complaint  Patient presents with  . Sickle Cell Pain Crisis  . Contractions     (Consider location/radiation/quality/duration/timing/severity/associated sxs/prior Treatment) HPI Comments: Patient here in transfer from [facility] hospital after being seen her for sickle cell crisis. She is currently [redacted] weeks pregnant and was found to have no acute condition involving her baby. States her pain is in her legs and consistent with her prior discomfort. Denies any fever or chills. No current vaginal bleeding. Received 3 doses of IV hydromorphone prior to arrival without total relief of her pain. Patient has been scheduled to have an exchange transfusion tomorrow at the sickle cell day hospital  Patient is a 25 y.o. female presenting with sickle cell pain. The history is provided by the patient and medical records.  Sickle Cell Pain Crisis   Past Medical History  Diagnosis Date  . Miscarriage 03/22/2011    Pt reports 2 miscarriages.  . Depression 01/06/2011  . GERD (gastroesophageal reflux disease) 02/17/2011  . Trichotillomania     h/o  . Blood transfusion     "lots"  . Sickle cell anemia with crisis ([place])   . Exertional dyspnea     "sometimes"  . Migraines 11/08/11    "@ least twice/month"  . Chronic back pain     "very severe; have knot in my back; from tight muscle; take RX and exercise for it"  . Mood swings ([place]) 11/08/11    "I go back and forth; real bad"  . Sickle cell anemia ([name])   . Blood transfusion without reported diagnosis   . Genital HSV    Past Surgical History  Procedure Laterality Date  . Cholecystectomy  05/2010  . Dilation and curettage of uterus  02/20/11    S/P miscarriage   Family History  Problem Relation Age of Onset  . Diabetes Maternal Grandmother   . Diabetes Paternal Grandmother   . Hypertension Paternal Grandmother   .  Diabetes Maternal Grandfather   . Sickle cell trait Mother   . Sickle cell trait Father    Social History  Substance Use Topics  . Smoking status: Former Smoker -- 0.25 packs/day for 1 years    Types: Cigarettes    Quit date: 03/25/2013  . Smokeless tobacco: Never Used  . Alcohol Use: No     Comment: pt states she quit marijuan in May 2013. Rare ETOH, + cigarettes.  She is enrolled in school   OB History    Gravida Para Term Preterm AB TAB SAB Ectopic Multiple Living   3 1 1  1  1   0 1      Obstetric Comments   Miscarried in October 2012 at about 7 weeks     Review of Systems  All other systems reviewed and are negative.     Allergies  Carrot; Carrot oil; and Latex  Home Medications   Prior to Admission medications   Medication Sig Start Date End Date Taking? Authorizing Provider  acetaminophen (TYLENOL) 325 MG tablet Take 650 mg by mouth every 6 (six) hours as needed for moderate pain.    Yes Historical Provider, MD  calcium carbonate (TUMS - DOSED IN MG ELEMENTAL CALCIUM) 500 MG chewable tablet Chew 2 tablets by mouth 2 (two) times daily as needed for indigestion or heartburn.   Yes Historical Provider, MD  folic acid (  FOLVITE) 1 MG tablet Take 1 mg by mouth daily.   Yes Historical Provider, MD  methadone (DOLOPHINE) 5 MG tablet Take 1 tablet (5 mg total) by mouth daily. 10/09/15  Yes Olugbemiga Essie Christine, MD  oxyCODONE (ROXICODONE) 15 MG immediate release tablet Take 1 tablet (15 mg total) by mouth every 4 (four) hours as needed for pain. 10/09/15  Yes Tresa Garter, MD  Prenatal Vit-Fe Fumarate-FA (PRENATAL MULTIVITAMIN) TABS tablet Take 1 tablet by mouth daily at 12 noon.   Yes Historical Provider, MD   BP 112/74 mmHg  Pulse 102  Temp(Src) 98.3 F (36.8 C) (Oral)  Resp 18  SpO2 98%  LMP 02/09/2015 Physical Exam  Constitutional: She is oriented to person, place, and time. She appears well-developed and well-nourished.  Non-toxic appearance. No distress.  HENT:   Head: Normocephalic and atraumatic.  Eyes: Conjunctivae, EOM and lids are normal. Pupils are equal, round, and reactive to light.  Neck: Normal range of motion. Neck supple. No tracheal deviation present. No thyroid mass present.  Cardiovascular: Normal rate, regular rhythm and normal heart sounds.  Exam reveals no gallop.   No murmur heard. Pulmonary/Chest: Effort normal and breath sounds normal. No stridor. No respiratory distress. She has no decreased breath sounds. She has no wheezes. She has no rhonchi. She has no rales.  Abdominal: Soft. Normal appearance and bowel sounds are normal. She exhibits no distension. There is no tenderness. There is no rebound and no CVA tenderness.  Musculoskeletal: Normal range of motion. She exhibits no edema or tenderness.  Neurological: She is alert and oriented to person, place, and time. She has normal strength. No cranial nerve deficit or sensory deficit. GCS eye subscore is 4. GCS verbal subscore is 5. GCS motor subscore is 6.  Skin: Skin is warm and dry. No abrasion and no rash noted.  Psychiatric: She has a normal mood and affect. Her speech is normal and behavior is normal.  Nursing note and vitals reviewed.   ED Course  Procedures (including critical care time) Labs Review Labs Reviewed  WET PREP, GENITAL - Abnormal; Notable for the following:    WBC, Wet Prep HPF POC MANY (*)    All other components within normal limits  URINALYSIS, ROUTINE W REFLEX MICROSCOPIC (NOT AT [facility]) - Abnormal; Notable for the following:    Specific Gravity, Urine <1.005 (*)    All other components within normal limits  CBC - Abnormal; Notable for the following:    WBC 14.8 (*)    RBC 3.04 (*)    Hemoglobin 8.9 (*)    HCT 26.5 (*)    RDW 20.2 (*)    Platelets 421 (*)    All other components within normal limits  CULTURE, BETA STREP (GROUP B ONLY)  TYPE AND SCREEN    Imaging Review No results found. I have personally reviewed and evaluated these images  and lab results as part of my medical decision-making.   EKG Interpretation None      MDM   Final diagnoses:  Herpes simplex type 2 (HSV-2) infection affecting pregnancy, antepartum, unspecified trimester  Maternal substance abuse, antepartum  High risk for intrapartum complications, antepartum  Maternal sickle cell anemia affecting pregnancy, antepartum ([place])    Case discussed with Dr. Zigmund Daniel who spoke with the [facility] doctor on-call. Because the patient is [redacted] weeks pregnant, the patient will need to be transferred back to [facility] hospital in a direct admit for further management of her sickle cell pain. I will  speak with the [facility] faculty practice and transfer the patient    Lacretia Leigh, MD 10/15/15 [phone]

## 2015-10-15 NOTE — ED Notes (Signed)
Per MD Zigmund Daniel she does not want the type and screen done in the ED

## 2015-10-15 NOTE — MAU Note (Signed)
Pt stated she was here last night and could not stay due to child care issues. Stated she started having increased pain and plevic pressure. Reports yellow/brown vaginal discaharge.

## 2015-10-15 NOTE — H&P (Signed)
Hospital Admission Note Date: 10/15/2015  Patient name: [name] Medical record number: [id number] Date of birth: 19-May-1990 Age: 25 y.o. Gender: female PCP: Angelica Chessman, MD  Attending physician: [name], MD  Chief Complaint:Nausea, Vomiting and Diarrhea for 6 hours  History of Present Illness:This is an opiate tolerant patient with Hb SS who was discharged from the hospital less than one week ago with sickle cell crisis. The patient is pregnant and near term and additionally has a high level of hemoglobin SS with and her blood. This makes her particularly susceptible for increased crises until she delivers the baby. She presented to [facility] emergency room for evaluation. The fetus was evaluation and found to be within normal limits. She continued to have pain and we are asked to see her for admission. I had a discussion with the OB/GYN Dr. Ilda Basset at [facility] hospital. Initial plan was made that the patient would go to the day hospital for type and cross for blood and then come back tomorrow to have partial exchange transfusion performed. However the patient was transferred to [facility] ED by Dr. Ilda Basset and I have been called to see the patient for evaluation for sickle cell crisis. In the emergency room at [facility] hospital she received 3 doses of Dilaudid 2 mg over the last 3 hours. Currently the patient rates her pain as 10 out of 10 localized mainly to her low back and right lower extremity. She describes the pain as being consistent with her usual sickle cell crisis.  She denies any nausea, vomiting, fever, chills, contractions. Focal weakness, headaches or neck stiffness. I am asked to admit her for sickle cell crisis.  Scheduled Meds:   Continuous Infusions: . lactated ringers 999 mL/hr at 10/15/15 1221   PRN Meds:. Allergies: Carrot; Carrot oil; and Latex Past Medical History  Diagnosis Date  . Miscarriage 03/22/2011    Pt reports 2 miscarriages.  .  Depression 01/06/2011  . GERD (gastroesophageal reflux disease) 02/17/2011  . Trichotillomania     h/o  . Blood transfusion     "lots"  . Sickle cell anemia with crisis ([place])   . Exertional dyspnea     "sometimes"  . Migraines 11/08/11    "@ least twice/month"  . Chronic back pain     "very severe; have knot in my back; from tight muscle; take RX and exercise for it"  . Mood swings ([place]) 11/08/11    "I go back and forth; real bad"  . Sickle cell anemia ([name])   . Blood transfusion without reported diagnosis   . Genital HSV    Past Surgical History  Procedure Laterality Date  . Cholecystectomy  05/2010  . Dilation and curettage of uterus  02/20/11    S/P miscarriage   Family History  Problem Relation Age of Onset  . Diabetes Maternal Grandmother   . Diabetes Paternal Grandmother   . Hypertension Paternal Grandmother   . Diabetes Maternal Grandfather   . Sickle cell trait Mother   . Sickle cell trait Father    Social History   Social History  . Marital Status: Single    Spouse Name: N/A  . Number of Children: N/A  . Years of Education: N/A   Occupational History  . Not on file.   Social History Main Topics  . Smoking status: Former Smoker -- 0.25 packs/day for 1 years    Types: Cigarettes    Quit date: 03/25/2013  . Smokeless tobacco: Never Used  .  Alcohol Use: No     Comment: pt states she quit marijuan in May 2013. Rare ETOH, + cigarettes.  She is enrolled in school  . 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 Sexual Activity: Yes    Birth Control/ Protection: None     Comment: last sex Jul 11 2015   Other Topics Concern  . 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  Physical Exam: No intake or output data in the 24 hours ending 10/15/15 1725 General: Alert,  awake, oriented x3, in severly acute distress.  HEENT: [place]/AT PEERL, EOMI, anicteric. Neck: Trachea midline,  no masses, no thyromegal,y no JVD, no carotid bruit OROPHARYNX:  Mucosa dry, No exudate/ erythema/lesions.  Heart: Regular rate and rhythm, without murmurs, rubs, gallops, PMI non-displaced, no heaves or thrills on palpation.  Lungs: Clear to auscultation, no wheezing or rhonchi noted. No increased vocal fremitus resonant to percussion  Abdomen: Gravid.   Neuro: No focal neurological deficits noted cranial nerves II through XII grossly intact. DTRs 2+ bilaterally upper and lower extremities. Strength at functional baseline in bilateral upper and lower extremities. Musculoskeletal: No warm swelling or erythema around joints, no spinal tenderness noted. Psychiatric: Patient alert and oriented x3, good insight and cognition, good recent to remote recall. Lymph node survey: No cervical axillary or inguinal lymphadenopathy noted.  Lab results:  Recent Labs  10/15/15 0023  NA 139  K 3.8  CL 106  GLUCOSE 92  BUN 4*  CREATININE 0.50   No results for input(s): AST, ALT, ALKPHOS, BILITOT, PROT, ALBUMIN in the last 72 hours. No results for input(s): LIPASE, AMYLASE in the last 72 hours.  Recent Labs  10/15/15 0014 10/15/15 0023 10/15/15 1215  WBC 15.0*  --  14.8*  NEUTROABS 11.2*  --   --   HGB 8.7* 9.2* 8.9*  HCT 25.7* 27.0* 26.5*  MCV 86.8  --  87.2  PLT 411*  --  421*   No results for input(s): CKTOTAL, CKMB, CKMBINDEX, TROPONINI in the last 72 hours. Invalid input(s): POCBNP No results for input(s): DDIMER in the last 72 hours. No results for input(s): HGBA1C in the last 72 hours. No results for input(s): CHOL, HDL, LDLCALC, TRIG, CHOLHDL, LDLDIRECT in the last 72 hours. No results for input(s): TSH, T4TOTAL, T3FREE, THYROIDAB in the last 72 hours.  Invalid input(s): FREET3  Recent Labs  10/15/15 0014  RETICCTPCT 19.2*   Imaging results:  Ir Fluoro Guide Cv Line  Right  09/25/2015  INDICATION: Sickle-cell crisis, [redacted] weeks pregnant, poor venous access. Request made for central venous access for medications /fluids. EXAM: RIGHT UPPER EXTREMITY PICC LINE PLACEMENT WITH ULTRASOUND AND FLUOROSCOPIC GUIDANCE MEDICATIONS: 1% lidocaine to skin and subcutaneous tissue ANESTHESIA/SEDATION: None FLUOROSCOPY TIME:  Fluoroscopy Time:  24 seconds COMPLICATIONS: None immediate. PROCEDURE: The patient was advised of the possible risks and complications and agreed to undergo the procedure. The patient was then brought to the angiographic suite for the procedure. The right arm was prepped with chlorhexidine, draped in the usual sterile fashion using maximum barrier technique (cap and mask, sterile gown, sterile gloves, large sterile sheet, hand hygiene and cutaneous antisepsis) and infiltrated locally with 1% Lidocaine. Ultrasound demonstrated patency of the right basilic vein, and  this was documented with an image. Under real-time ultrasound guidance, this vein was accessed with a 21 gauge micropuncture needle and image documentation was performed. A 0.018 wire was introduced in to the vein. Over this, a 5 [place] double lumen power injectable PICC was advanced to the lower SVC/right atrial junction. Fluoroscopy during the procedure and fluoro spot radiograph confirms appropriate catheter position. The catheter was flushed and covered with a sterile dressing. Catheter length: 35 cm IMPRESSION: Successful right arm Power PICC line placement with ultrasound and fluoroscopic guidance. The catheter is ready for use. Read by: Rowe Robert, PA-C Electronically Signed   By: Aletta Edouard M.D.   On: 09/25/2015 17:05   Ir US Guide Vasc Access Right  09/25/2015  INDICATION: Sickle-cell crisis, [redacted] weeks pregnant, poor venous access. Request made for central venous access for medications /fluids. EXAM: RIGHT UPPER EXTREMITY PICC LINE PLACEMENT WITH ULTRASOUND AND FLUOROSCOPIC GUIDANCE MEDICATIONS:  1% lidocaine to skin and subcutaneous tissue ANESTHESIA/SEDATION: None FLUOROSCOPY TIME:  Fluoroscopy Time:  24 seconds COMPLICATIONS: None immediate. PROCEDURE: The patient was advised of the possible risks and complications and agreed to undergo the procedure. The patient was then brought to the angiographic suite for the procedure. The right arm was prepped with chlorhexidine, draped in the usual sterile fashion using maximum barrier technique (cap and mask, sterile gown, sterile gloves, large sterile sheet, hand hygiene and cutaneous antisepsis) and infiltrated locally with 1% Lidocaine. Ultrasound demonstrated patency of the right basilic vein, and this was documented with an image. Under real-time ultrasound guidance, this vein was accessed with a 21 gauge micropuncture needle and image documentation was performed. A 0.018 wire was introduced in to the vein. Over this, a 5 [place] double lumen power injectable PICC was advanced to the lower SVC/right atrial junction. Fluoroscopy during the procedure and fluoro spot radiograph confirms appropriate catheter position. The catheter was flushed and covered with a sterile dressing. Catheter length: 35 cm IMPRESSION: Successful right arm Power PICC line placement with ultrasound and fluoroscopic guidance. The catheter is ready for use. Read by: Rowe Robert, PA-C Electronically Signed   By: Aletta Edouard M.D.   On: 09/25/2015 17:05   [place] Mfm Fetal Bpp Wo Non Stress  10/13/2015  OBSTETRICAL ULTRASOUND: This exam was performed within a [facility] Department. The OB US report was generated in the AS system, and faxed to the ordering physician.  This report is available in the [facility]. See the AS Obstetric US report via the Image Link.  [place] Mfm Fetal Bpp Wo Non Stress  10/06/2015  OBSTETRICAL ULTRASOUND: This exam was performed within a [facility]trasound Department. The OB US report was generated in the AS system, and faxed to the ordering  physician.  This report is available in the [facility]. See the AS Obstetric US report via the Image Link.  [place] Mfm Fetal Bpp Wo Non Stress  09/30/2015  OBSTETRICAL ULTRASOUND: This exam was performed within a [place] Ultrasound Department. The OB US report was generated in the AS system, and faxed to the ordering physician.  This report is available in the [facility]. See the AS Obstetric US report via the Image Link.  [place] Mfm Fetal Bpp Wo Non Stress  09/22/2015  OBSTETRICAL ULTRASOUND: This exam was performed within a [place] Ultrasound Department. The OB US report was generated in the AS system, and faxed to the ordering physician.  This report is available in the [facility]. See the AS Obstetric US  report via the Image Link.  [place] Mfm Ob Follow Up  10/13/2015  OBSTETRICAL ULTRASOUND: This exam was performed within a [place] Ultrasound Department. The OB US report was generated in the AS system, and faxed to the ordering physician.  This report is available in the [facility]. See the AS Obstetric US report via the Image Link.  [place] Mfm Ob Follow Up  09/22/2015  OBSTETRICAL ULTRASOUND: This exam was performed within a [facility] Ultrasound Department. The OB US report was generated in the AS system, and faxed to the ordering physician.  This report is available in the [facility]. See the AS Obstetric US report via the Image Link.    Assessment and Plan: 1. Hb SS with crisis: Crisis likely triggered by high percentage of hemoglobin S and near-term condition. Will treat pain with Dilaudid via PCA, and IV fluids. Pt unable to receive NSAID's due to state of pregnancy. 2. Anemia of chronic disease: Patient has a hemoglobin of 8.7 g/dL. Although she missed the target of hemoglobin levels. Her last hemoglobin S level was 79% which is much greater than the target of 30%. I will proceed with a partial exchange transfusion and reevaluate her for possible discharge tomorrow. 3. 3rd Trimester  Pregnancy: Pt to be evaluated on a daily basis by Ob-Gyn rapid response team as an extension of [place].  It is very likely that she will require partial exchange transfusion as the high Hb S in pregnancy leads to worse crisis and risk of IUGR in fetus,  4. Leukocytosis: Likely related to crisis, hemo-concentration and stress state.  5. DVT Prophylaxis: Lovenox daily.  Time spent on admission 1 hour  Ilyaas Musto A. 10/15/2015, 5:25 PM

## 2015-10-15 NOTE — ED Notes (Signed)
Patient transfer from [facility] with complaints of sickle cell pain crisis. Given 3 doses of Dilaudid without relief.

## 2015-10-15 NOTE — Progress Notes (Signed)
Admitted from ED sleepy, in no apparent distress.

## 2015-10-15 NOTE — ED Provider Notes (Signed)
CSN: [id number]     Arrival date & time 10/14/15  2318 History  By signing my name below, I, [name], attest that this documentation has been prepared under the direction and in the presence of Justine Cossin, MD. Electronically Signed: Randa [name], ED Scribe. 10/15/2015. 12:15 AM.      Chief Complaint  Patient presents with  . Sickle Cell Pain Crisis    Patient is a 25 y.o. female presenting with sickle cell pain. The history is provided by the patient. No language interpreter was used.  Sickle Cell Pain Crisis Location:  Back Severity:  Severe Onset quality:  Gradual Similar to previous crisis episodes: yes   Timing:  Constant Progression:  Unchanged Chronicity:  Recurrent Frequency of attacks:  Very frequent especially when she goes in the pool History of pulmonary emboli: no   Context: pregnancy   Context: not dehydration   Relieved by:  Nothing Ineffective treatments:  Prescription drugs Associated symptoms: no chest pain, no congestion, no cough, no fever, no shortness of breath, no vomiting and no wheezing   Risk factors: frequent admissions for pain and frequent pain crises    HPI Comments: [name] is a 25 y.o. female who presents to the Emergency Department complaining of sickle cell pain onset 2 days prior. Pt reports that her pain is located in her low back. Pt reports that she did get in a cold water. Pt reports that her sickle cell pain is sometimes brought on from getting in cold water. Pt reports that she has tried her pain medication with no relief. Pt reports that she [redacted] weeks pregnant. Pt states that she usually has relief with dilaudid. Pt reports that she was recent admitted for her sickle cell pain.  [id number]  Past Medical History  Diagnosis Date  . Miscarriage 03/22/2011    Pt reports 2 miscarriages.  . Depression 01/06/2011  . GERD (gastroesophageal reflux disease) 02/17/2011  . Trichotillomania     h/o  . Blood transfusion     "lots"   . Sickle cell anemia with crisis ([place])   . Exertional dyspnea     "sometimes"  . Migraines 11/08/11    "@ least twice/month"  . Chronic back pain     "very severe; have knot in my back; from tight muscle; take RX and exercise for it"  . Mood swings ([place]) 11/08/11    "I go back and forth; real bad"  . Sickle cell anemia ([name])   . Blood transfusion without reported diagnosis   . Genital HSV    Past Surgical History  Procedure Laterality Date  . Cholecystectomy  05/2010  . Dilation and curettage of uterus  02/20/11    S/P miscarriage   Family History  Problem Relation Age of Onset  . Diabetes Maternal Grandmother   . Diabetes Paternal Grandmother   . Hypertension Paternal Grandmother   . Diabetes Maternal Grandfather   . Sickle cell trait Mother   . Sickle cell trait Father    Social History  Substance Use Topics  . Smoking status: Former Smoker -- 0.25 packs/day for 1 years    Types: Cigarettes    Quit date: 03/25/2013  . Smokeless tobacco: Never Used  . Alcohol Use: No     Comment: pt states she quit marijuan in May 2013. Rare ETOH, + cigarettes.  She is enrolled in school   OB History    Gravida Para Term Preterm AB TAB SAB Ectopic Multiple Living   3 1  1  1  1   0 1      Obstetric Comments   Miscarried in October 2012 at about 7 weeks     Review of Systems  Constitutional: Negative for fever.  HENT: Negative for congestion.   Respiratory: Negative for cough, shortness of breath and wheezing.   Cardiovascular: Negative for chest pain.  Gastrointestinal: Negative for vomiting.  Musculoskeletal: Positive for back pain.  All other systems reviewed and are negative.     Allergies  Carrot; Carrot oil; and Latex  Home Medications   Prior to Admission medications   Medication Sig Start Date End Date Taking? Authorizing Provider  acetaminophen (TYLENOL) 325 MG tablet Take 650 mg by mouth every 6 (six) hours as needed.   Yes Historical Provider, MD  calcium  carbonate (TUMS - DOSED IN MG ELEMENTAL CALCIUM) 500 MG chewable tablet Chew 2 tablets by mouth 2 (two) times daily as needed for indigestion or heartburn.   Yes Historical Provider, MD  folic acid (FOLVITE) 1 MG tablet Take 1 mg by mouth daily.   Yes Historical Provider, MD  methadone (DOLOPHINE) 5 MG tablet Take 1 tablet (5 mg total) by mouth daily. 10/09/15  Yes Olugbemiga Essie Christine, MD  oxyCODONE (ROXICODONE) 15 MG immediate release tablet Take 1 tablet (15 mg total) by mouth every 4 (four) hours as needed for pain. 10/09/15  Yes Tresa Garter, MD  Prenatal Vit-Fe Fumarate-FA (PRENATAL MULTIVITAMIN) TABS tablet Take 1 tablet by mouth daily at 12 noon.   Yes Historical Provider, MD   BP 125/70 mmHg  Pulse 94  Temp(Src) 98.5 F (36.9 C) (Oral)  Resp 17  SpO2 98%  LMP 02/09/2015    Physical Exam  Constitutional: She is oriented to person, place, and time. She appears well-developed and well-nourished. No distress.  texting on the phone resting comfortably in the room at the time of the exam  HENT:  Head: Normocephalic and atraumatic.  Eyes: Conjunctivae and EOM are normal.  Neck: Normal range of motion. Neck supple. No tracheal deviation present.  Cardiovascular: Normal rate, regular rhythm and normal heart sounds.   Pulmonary/Chest: Effort normal. No respiratory distress. She has no wheezes. She has no rales.  Abdominal: Soft. Bowel sounds are normal. There is no tenderness. There is no rebound and no guarding.  Gravid abdomen, head palpable in pelvis, Positive fetal movement.   Musculoskeletal: Normal range of motion. She exhibits no edema or tenderness.  Neurological: She is alert and oriented to person, place, and time. She has normal reflexes.  Skin: Skin is warm and dry.  Psychiatric: She has a normal mood and affect. Her behavior is normal.  Nursing note and vitals reviewed.   ED Course  Procedures (including critical care time) DIAGNOSTIC STUDIES: Oxygen Saturation is  99% on RA, normal by my interpretation.    COORDINATION OF CARE: 12:14 AM-Discussed treatment plan with pt at bedside and pt agreed to plan.     Labs Review Labs Reviewed  CBC WITH DIFFERENTIAL/PLATELET - Abnormal; Notable for the following:    WBC 15.0 (*)    RBC 2.96 (*)    Hemoglobin 8.7 (*)    HCT 25.7 (*)    RDW 19.7 (*)    Platelets 411 (*)    Neutro Abs 11.2 (*)    Monocytes Absolute 1.5 (*)    All other components within normal limits  RETICULOCYTES - Abnormal; Notable for the following:    Retic Ct Pct 19.2 (*)    RBC.  2.96 (*)    Retic Count, Manual 568.3 (*)    All other components within normal limits  URINALYSIS, ROUTINE W REFLEX MICROSCOPIC (NOT AT [facility]) - Abnormal; Notable for the following:    APPearance CLOUDY (*)    All other components within normal limits  I-STAT CHEM 8, ED - Abnormal; Notable for the following:    BUN 4 (*)    Hemoglobin 9.2 (*)    HCT 27.0 (*)    All other components within normal limits    Imaging Review [place] Mfm Fetal Bpp Wo Non Stress  10/13/2015  OBSTETRICAL ULTRASOUND: This exam was performed within a [facility]. The OB US report was generated in the AS system, and faxed to the ordering physician.  This report is available in the [facility]. See the AS Obstetric US report via the Image Link.  [place] Mfm Ob Follow Up  10/13/2015  OBSTETRICAL ULTRASOUND: This exam was performed within a [facility]. The OB US report was generated in the AS system, and faxed to the ordering physician.  This report is available in the [facility]. See the AS Obstetric US report via the Image Link.  I have personally reviewed and evaluated these images and lab results as part of my medical decision-making.   EKG Interpretation None      MDM   Final diagnoses:  None   Filed Vitals:   10/15/15 0407 10/15/15 0430  BP: 109/72 108/60  Pulse: 96 101  Temp:    Resp: 18 18    Results for orders placed  or performed during the hospital encounter of 10/14/15  CBC with Differential/Platelet  Result Value Ref Range   WBC 15.0 (H) 4.0 - 10.5 K/uL   RBC 2.96 (L) 3.87 - 5.11 MIL/uL   Hemoglobin 8.7 (L) 12.0 - 15.0 g/dL   HCT 25.7 (L) 36.0 - 46.0 %   MCV 86.8 78.0 - 100.0 fL   MCH 29.4 26.0 - 34.0 pg   MCHC 33.9 30.0 - 36.0 g/dL   RDW 19.7 (H) 11.5 - 15.5 %   Platelets 411 (H) 150 - 400 K/uL   Neutrophils Relative % 74 %   Neutro Abs 11.2 (H) 1.7 - 7.7 K/uL   Lymphocytes Relative 14 %   Lymphs Abs 2.1 0.7 - 4.0 K/uL   Monocytes Relative 10 %   Monocytes Absolute 1.5 (H) 0.1 - 1.0 K/uL   Eosinophils Relative 2 %   Eosinophils Absolute 0.2 0.0 - 0.7 K/uL   Basophils Relative 0 %   Basophils Absolute 0.0 0.0 - 0.1 K/uL  Reticulocytes  Result Value Ref Range   Retic Ct Pct 19.2 (H) 0.4 - 3.1 %   RBC. 2.96 (L) 3.87 - 5.11 MIL/uL   Retic Count, Manual 568.3 (H) 19.0 - 186.0 K/uL  Urinalysis, Routine w reflex microscopic (not at [facility])  Result Value Ref Range   Color, Urine YELLOW YELLOW   APPearance CLOUDY (A) CLEAR   Specific Gravity, Urine 1.012 1.005 - 1.030   pH 6.5 5.0 - 8.0   Glucose, UA NEGATIVE NEGATIVE mg/dL   Hgb urine dipstick NEGATIVE NEGATIVE   Bilirubin Urine NEGATIVE NEGATIVE   Ketones, ur NEGATIVE NEGATIVE mg/dL   Protein, ur NEGATIVE NEGATIVE mg/dL   Nitrite NEGATIVE NEGATIVE   Leukocytes, UA NEGATIVE NEGATIVE  I-Stat Chem 8, ED  Result Value Ref Range   Sodium 139 135 - 145 mmol/L   Potassium 3.8 3.5 - 5.1 mmol/L  Chloride 106 101 - 111 mmol/L   BUN 4 (L) 6 - 20 mg/dL   Creatinine, Ser 0.50 0.44 - 1.00 mg/dL   Glucose, Bld 92 65 - 99 mg/dL   Calcium, Ion 1.15 1.12 - 1.23 mmol/L   TCO2 21 0 - 100 mmol/L   Hemoglobin 9.2 (L) 12.0 - 15.0 g/dL   HCT 27.0 (L) 36.0 - 46.0 %   Ir Fluoro Guide Cv Line Right  09/25/2015  INDICATION: Sickle-cell crisis, [redacted] weeks pregnant, poor venous access. Request made for central venous access for medications /fluids. EXAM: RIGHT  UPPER EXTREMITY PICC LINE PLACEMENT WITH ULTRASOUND AND FLUOROSCOPIC GUIDANCE MEDICATIONS: 1% lidocaine to skin and subcutaneous tissue ANESTHESIA/SEDATION: None FLUOROSCOPY TIME:  Fluoroscopy Time:  24 seconds COMPLICATIONS: None immediate. PROCEDURE: The patient was advised of the possible risks and complications and agreed to undergo the procedure. The patient was then brought to the angiographic suite for the procedure. The right arm was prepped with chlorhexidine, draped in the usual sterile fashion using maximum barrier technique (cap and mask, sterile gown, sterile gloves, large sterile sheet, hand hygiene and cutaneous antisepsis) and infiltrated locally with 1% Lidocaine. Ultrasound demonstrated patency of the right basilic vein, and this was documented with an image. Under real-time ultrasound guidance, this vein was accessed with a 21 gauge micropuncture needle and image documentation was performed. A 0.018 wire was introduced in to the vein. Over this, a 5 [place] double lumen power injectable PICC was advanced to the lower SVC/right atrial junction. Fluoroscopy during the procedure and fluoro spot radiograph confirms appropriate catheter position. The catheter was flushed and covered with a sterile dressing. Catheter length: 35 cm IMPRESSION: Successful right arm Power PICC line placement with ultrasound and fluoroscopic guidance. The catheter is ready for use. Read by: Rowe Robert, PA-C Electronically Signed   By: Aletta Edouard M.D.   On: 09/25/2015 17:05   Ir US Guide Vasc Access Right  09/25/2015  INDICATION: Sickle-cell crisis, [redacted] weeks pregnant, poor venous access. Request made for central venous access for medications /fluids. EXAM: RIGHT UPPER EXTREMITY PICC LINE PLACEMENT WITH ULTRASOUND AND FLUOROSCOPIC GUIDANCE MEDICATIONS: 1% lidocaine to skin and subcutaneous tissue ANESTHESIA/SEDATION: None FLUOROSCOPY TIME:  Fluoroscopy Time:  24 seconds COMPLICATIONS: None immediate. PROCEDURE: The  patient was advised of the possible risks and complications and agreed to undergo the procedure. The patient was then brought to the angiographic suite for the procedure. The right arm was prepped with chlorhexidine, draped in the usual sterile fashion using maximum barrier technique (cap and mask, sterile gown, sterile gloves, large sterile sheet, hand hygiene and cutaneous antisepsis) and infiltrated locally with 1% Lidocaine. Ultrasound demonstrated patency of the right basilic vein, and this was documented with an image. Under real-time ultrasound guidance, this vein was accessed with a 21 gauge micropuncture needle and image documentation was performed. A 0.018 wire was introduced in to the vein. Over this, a 5 [place] double lumen power injectable PICC was advanced to the lower SVC/right atrial junction. Fluoroscopy during the procedure and fluoro spot radiograph confirms appropriate catheter position. The catheter was flushed and covered with a sterile dressing. Catheter length: 35 cm IMPRESSION: Successful right arm Power PICC line placement with ultrasound and fluoroscopic guidance. The catheter is ready for use. Read by: Rowe Robert, PA-C Electronically Signed   By: Aletta Edouard M.D.   On: 09/25/2015 17:05   [place] Mfm Fetal Bpp Wo Non Stress  10/13/2015  OBSTETRICAL ULTRASOUND: This exam was performed within a [facility]. The OB US report was generated in the AS system, and faxed to the ordering physician.  This report is available in the [facility]. See the AS Obstetric US report via the Image Link.  [place] Mfm Fetal Bpp Wo Non Stress  10/06/2015  OBSTETRICAL ULTRASOUND: This exam was performed within a [facility] Department. The OB US report was generated in the AS system, and faxed to the ordering physician.  This report is available in the [facility]. See the AS Obstetric US report via the Image Link.  [place] Mfm Fetal Bpp Wo Non Stress  09/30/2015  OBSTETRICAL  ULTRASOUND: This exam was performed within a [place] [facility] [facility] Department. The OB US report was generated in the AS system, and faxed to the ordering physician.  This report is available in the [facility]. See the AS Obstetric US report via the Image Link.  [place] Mfm Fetal Bpp Wo Non Stress  09/22/2015  OBSTETRICAL ULTRASOUND: This exam was performed within a [place] Department. The OB US report was generated in the AS system, and faxed to the ordering physician.  This report is available in the [facility]. See the AS Obstetric US report via the Image Link.  [place] Mfm Ob Follow Up  10/13/2015  OBSTETRICAL ULTRASOUND: This exam was performed within a [facility] Department. The OB US report was generated in the AS system, and faxed to the ordering physician.  This report is available in the [facility]. See the AS Obstetric US report via the Image Link.  [place] Mfm Ob Follow Up  09/22/2015  OBSTETRICAL ULTRASOUND: This exam was performed within a [facility] Department. The OB US report was generated in the AS system, and faxed to the ordering physician.  This report is available in the [facility]. See the AS Obstetric US report via the Image Link.   Seen by OB rapid response.  Cleared by rapid response  Medications  sodium chloride 0.9 % bolus 1,000 mL (0 mLs Intravenous Stopped 10/15/15 0303)  HYDROmorphone (DILAUDID) injection 1 mg (1 mg Intravenous Given 10/15/15 0028)  diphenhydrAMINE (BENADRYL) capsule 25 mg (25 mg Oral Given 10/15/15 0028)  ondansetron (ZOFRAN) injection 4 mg (4 mg Intravenous Given 10/15/15 0028)  HYDROmorphone (DILAUDID) injection 1 mg (1 mg Intravenous Given 10/15/15 0147)  sodium chloride 0.9 % bolus 1,000 mL (0 mLs Intravenous Stopped 10/15/15 0329)  HYDROmorphone (DILAUDID) injection 1 mg (1 mg Intravenous Given 10/15/15 0326)  HYDROmorphone (DILAUDID) injection 0.5 mg (0.5 mg Intravenous Given 10/15/15 0421)   EDP was judicious with narcotics as  is not comfortable with neonatal CNS and cardiac depression this close to delivery.  While I understand this patient has a h/o  Polysubstance abuse and dependence, patient is sleeping and I highly doubt 10/10 pain.  She is stable for discharge at this time.    I personally performed the services described in this documentation, which was scribed in my presence. The recorded information has been reviewed and is accurate.        Veatrice Kells, MD 10/15/15 [phone]

## 2015-10-15 NOTE — MAU Note (Signed)
C/o ucs that started this AM between 1am-4am; was seen at [facility] ED last night; C/o ?SROM yesterday around 2200; hx of sickle cell and c/o sickle cell pain;

## 2015-10-15 NOTE — ED Notes (Signed)
Pt is [redacted] weeks pregnant. OB Rapid response paged. Spoke with Juliann Pulse. She is on her way.

## 2015-10-15 NOTE — Progress Notes (Signed)
Called by RN regarding patient report of  increased pain not improving with IV dilaudid and desire for transfer to [facility].  Dr. Ilda Basset notified regarding patient request.  Plan: MAU for reassessment.

## 2015-10-15 NOTE — ED Notes (Signed)
Bed: WA06 Expected date:  Expected time:  Means of arrival:  Comments: [facility] from [facility]

## 2015-10-16 ENCOUNTER — Observation Stay (HOSPITAL_COMMUNITY): Payer: Medicare Other

## 2015-10-16 DIAGNOSIS — Z3A36 36 weeks gestation of pregnancy: Secondary | ICD-10-CM | POA: Diagnosis not present

## 2015-10-16 DIAGNOSIS — B009 Herpesviral infection, unspecified: Secondary | ICD-10-CM | POA: Diagnosis not present

## 2015-10-16 DIAGNOSIS — D72829 Elevated white blood cell count, unspecified: Secondary | ICD-10-CM | POA: Diagnosis present

## 2015-10-16 DIAGNOSIS — F329 Major depressive disorder, single episode, unspecified: Secondary | ICD-10-CM | POA: Diagnosis present

## 2015-10-16 DIAGNOSIS — Z8249 Family history of ischemic heart disease and other diseases of the circulatory system: Secondary | ICD-10-CM | POA: Diagnosis not present

## 2015-10-16 DIAGNOSIS — Z452 Encounter for adjustment and management of vascular access device: Secondary | ICD-10-CM | POA: Diagnosis not present

## 2015-10-16 DIAGNOSIS — D5700 Hb-SS disease with crisis, unspecified: Secondary | ICD-10-CM | POA: Diagnosis not present

## 2015-10-16 DIAGNOSIS — Z3A35 35 weeks gestation of pregnancy: Secondary | ICD-10-CM | POA: Diagnosis not present

## 2015-10-16 DIAGNOSIS — Z79891 Long term (current) use of opiate analgesic: Secondary | ICD-10-CM | POA: Diagnosis not present

## 2015-10-16 DIAGNOSIS — Z79899 Other long term (current) drug therapy: Secondary | ICD-10-CM | POA: Diagnosis not present

## 2015-10-16 DIAGNOSIS — D571 Sickle-cell disease without crisis: Secondary | ICD-10-CM | POA: Diagnosis not present

## 2015-10-16 DIAGNOSIS — A6000 Herpesviral infection of urogenital system, unspecified: Secondary | ICD-10-CM | POA: Diagnosis present

## 2015-10-16 DIAGNOSIS — Z832 Family history of diseases of the blood and blood-forming organs and certain disorders involving the immune mechanism: Secondary | ICD-10-CM | POA: Diagnosis not present

## 2015-10-16 DIAGNOSIS — B951 Streptococcus, group B, as the cause of diseases classified elsewhere: Secondary | ICD-10-CM | POA: Diagnosis not present

## 2015-10-16 DIAGNOSIS — Z91040 Latex allergy status: Secondary | ICD-10-CM | POA: Diagnosis not present

## 2015-10-16 DIAGNOSIS — O99013 Anemia complicating pregnancy, third trimester: Secondary | ICD-10-CM | POA: Diagnosis present

## 2015-10-16 DIAGNOSIS — K219 Gastro-esophageal reflux disease without esophagitis: Secondary | ICD-10-CM | POA: Diagnosis present

## 2015-10-16 DIAGNOSIS — Z87891 Personal history of nicotine dependence: Secondary | ICD-10-CM | POA: Diagnosis not present

## 2015-10-16 DIAGNOSIS — Z91018 Allergy to other foods: Secondary | ICD-10-CM | POA: Diagnosis not present

## 2015-10-16 DIAGNOSIS — O98313 Other infections with a predominantly sexual mode of transmission complicating pregnancy, third trimester: Secondary | ICD-10-CM | POA: Diagnosis present

## 2015-10-16 DIAGNOSIS — O98519 Other viral diseases complicating pregnancy, unspecified trimester: Secondary | ICD-10-CM | POA: Diagnosis not present

## 2015-10-16 DIAGNOSIS — Z833 Family history of diabetes mellitus: Secondary | ICD-10-CM | POA: Diagnosis not present

## 2015-10-16 DIAGNOSIS — D638 Anemia in other chronic diseases classified elsewhere: Secondary | ICD-10-CM | POA: Diagnosis not present

## 2015-10-16 DIAGNOSIS — M79606 Pain in leg, unspecified: Secondary | ICD-10-CM | POA: Diagnosis not present

## 2015-10-16 MED ORDER — LIDOCAINE HCL 1 % IJ SOLN
INTRAMUSCULAR | Status: AC
  Filled 2015-10-16: qty 20

## 2015-10-16 NOTE — Progress Notes (Signed)
RROB called about patient's inpatient status and NST; consulted with Dr Elonda Husky about monitoring orders; pt denies pregnancy or labor s/s at this time with the exception of occasional contractions; EFM applied at this time

## 2015-10-16 NOTE — Procedures (Signed)
Interventional Radiology Procedure Note  Procedure:  Right arm PICC line placement  Complications:  None  Estimated Blood Loss: < 10 mL  Right arm DL Power PICC, 33 cm.  Tip at SVC/RA junction.  Venetia Night. Kathlene Cote, M.D Pager:  [phone]

## 2015-10-16 NOTE — Progress Notes (Signed)
SICKLE CELL SERVICE PROGRESS NOTE  [name] [id number] DOB: May 15, 1991 DOA: 10/15/2015 PCP: Angelica Chessman, MD  Assessment/Plan: Active Problems:   Hb-SS disease with crisis ([place])   Sickle cell crisis ([place])  1. Hb SS with crisis: Continue PCA and clinician assisted Dilaudid at current doses. Patient eating and drinking well so will continue IV fluids and a decreased dose.  2. Anemia of chronic disease: Patient awake and PICC line placement by fluoroscopy in order to perform partial exchange transfusion. Otherwise hemoglobin stable 3. Chronic pain: Continue methadone 5 mg daily at bedtime. 4. Third trimester pregnancy: Patient alert trimester of her pregnancy. Approximately [redacted] weeks gestation age. She's been evaluated by obstetrics and DOB rapid response team will follow her daily over here.   Code Status: Full Code Family Communication: N/A Disposition Plan: Not yet ready for discharge  [place].  Pager [phone]. If 7PM-7AM, please contact night-coverage.  10/16/2015, 2:13 PM    Interim History: Patient reports that she feels sore in her right thigh area today. She rates her pain as 8/10 and localized to low back and legs.  Consultants:  Obstetrics rapid response team  Procedures:  None   Antibiotics:  None   Objective: Filed Vitals:   10/16/15 0734 10/16/15 1052 10/16/15 1102 10/16/15 1330  BP:  122/78  116/80  Pulse:  108  106  Temp:  98.6 F (37 C)  98.6 F (37 C)  TempSrc:  Oral  Oral  Resp: 16 15 16 13   Height:      Weight:      SpO2: 98% 99% 96% 96%   Weight change:   Intake/Output Summary (Last 24 hours) at 10/16/15 1413 Last data filed at 10/16/15 [id number]  Gross per 24 hour  Intake 1546.3 ml  Output   1150 ml  Net  396.3 ml       General: Alert, awake, oriented x3, in moderate distress secondary to pain.  HEENT: [place]/AT [place]ERL, EOMI, anicteric Neck: Trachea midline,  no masses, no thyromegal,y no JVD, no carotid  bruit OROPHARYNX:  Moist, No exudate/ erythema/lesions.  Heart: Regular rate and rhythm, without murmurs, rubs, gallops, PMI non-displaced, no heaves or thrills on palpation.  Lungs: Clear to auscultation, no wheezing or rhonchi noted. No increased vocal fremitus resonant to percussion  Abdomen: Gravid Neuro: No focal neurological deficits noted cranial nerves II through XII grossly intact. Strength  in bilateral upper and lower extremities. Musculoskeletal: No warm swelling or erythema around joints, no spinal tenderness noted. Psychiatric: Patient alert and oriented x3, good insight and cognition, good recent to remote recall.    Data Reviewed: Basic Metabolic Panel:  Recent Labs Lab 10/15/15 0023 10/15/15 1729  NA 139 137  K 3.8 3.7  CL 106 109  CO2  --  22  GLUCOSE 92 85  BUN 4* <5*  CREATININE 0.50 0.45  CALCIUM  --  8.3*   Liver Function Tests: No results for input(s): AST, ALT, ALKPHOS, BILITOT, PROT, ALBUMIN in the last 168 hours. No results for input(s): LIPASE, AMYLASE in the last 168 hours. No results for input(s): AMMONIA in the last 168 hours. CBC:  Recent Labs Lab 10/15/15 0014 10/15/15 0023 10/15/15 1215 10/15/15 1933  WBC 15.0*  --  14.8* 13.1*  NEUTROABS 11.2*  --   --  9.9*  HGB 8.7* 9.2* 8.9* 8.3*  HCT 25.7* 27.0* 26.5* 23.6*  MCV 86.8  --  87.2 86.4  PLT 411*  --  421* 380   Cardiac Enzymes:  No results for input(s): CKTOTAL, CKMB, CKMBINDEX, TROPONINI in the last 168 hours. BNP (last 3 results) No results for input(s): BNP in the last 8760 hours.  ProBNP (last 3 results) No results for input(s): PROBNP in the last 8760 hours.  CBG: No results for input(s): GLUCAP in the last 168 hours.  Recent Results (from the past 240 hour(s))  Culture, beta strep (group b only)     Status: Abnormal (Preliminary result)   Collection Time: 10/15/15 11:35 AM  Result Value Ref Range Status   Specimen Description VAGINAL/RECTAL  Final   Special Requests  NONE  Final   Culture GROUP B STREP(S.AGALACTIAE)ISOLATED (A)  Final   Report Status PENDING  Incomplete  Wet prep, genital     Status: Abnormal   Collection Time: 10/15/15 11:35 AM  Result Value Ref Range Status   Yeast Wet Prep HPF POC NONE SEEN NONE SEEN Final   Trich, Wet Prep NONE SEEN NONE SEEN Final   Clue Cells Wet Prep HPF POC NONE SEEN NONE SEEN Final   WBC, Wet Prep HPF POC MANY (A) NONE SEEN Final    Comment: MODERATE BACTERIA SEEN   Sperm NONE SEEN  Final     Studies: Ir Fluoro Guide Cv Line Right  09/25/2015  INDICATION: Sickle-cell crisis, [redacted] weeks pregnant, poor venous access. Request made for central venous access for medications /fluids. EXAM: RIGHT UPPER EXTREMITY PICC LINE PLACEMENT WITH ULTRASOUND AND FLUOROSCOPIC GUIDANCE MEDICATIONS: 1% lidocaine to skin and subcutaneous tissue ANESTHESIA/SEDATION: None FLUOROSCOPY TIME:  Fluoroscopy Time:  24 seconds COMPLICATIONS: None immediate. PROCEDURE: The patient was advised of the possible risks and complications and agreed to undergo the procedure. The patient was then brought to the angiographic suite for the procedure. The right arm was prepped with chlorhexidine, draped in the usual sterile fashion using maximum barrier technique (cap and mask, sterile gown, sterile gloves, large sterile sheet, hand hygiene and cutaneous antisepsis) and infiltrated locally with 1% Lidocaine. Ultrasound demonstrated patency of the right basilic vein, and this was documented with an image. Under real-time ultrasound guidance, this vein was accessed with a 21 gauge micropuncture needle and image documentation was performed. A 0.018 wire was introduced in to the vein. Over this, a 5 [place] double lumen power injectable PICC was advanced to the lower SVC/right atrial junction. Fluoroscopy during the procedure and fluoro spot radiograph confirms appropriate catheter position. The catheter was flushed and covered with a sterile dressing. Catheter length:  35 cm IMPRESSION: Successful right arm Power PICC line placement with ultrasound and fluoroscopic guidance. The catheter is ready for use. Read by: Rowe Robert, PA-C Electronically Signed   By: Aletta Edouard M.D.   On: 09/25/2015 17:05   Ir US Guide Vasc Access Right  09/25/2015  INDICATION: Sickle-cell crisis, [redacted] weeks pregnant, poor venous access. Request made for central venous access for medications /fluids. EXAM: RIGHT UPPER EXTREMITY PICC LINE PLACEMENT WITH ULTRASOUND AND FLUOROSCOPIC GUIDANCE MEDICATIONS: 1% lidocaine to skin and subcutaneous tissue ANESTHESIA/SEDATION: None FLUOROSCOPY TIME:  Fluoroscopy Time:  24 seconds COMPLICATIONS: None immediate. PROCEDURE: The patient was advised of the possible risks and complications and agreed to undergo the procedure. The patient was then brought to the angiographic suite for the procedure. The right arm was prepped with chlorhexidine, draped in the usual sterile fashion using maximum barrier technique (cap and mask, sterile gown, sterile gloves, large sterile sheet, hand hygiene and cutaneous antisepsis) and infiltrated locally with 1% Lidocaine. Ultrasound demonstrated patency of the right basilic vein,  and this was documented with an image. Under real-time ultrasound guidance, this vein was accessed with a 21 gauge micropuncture needle and image documentation was performed. A 0.018 wire was introduced in to the vein. Over this, a 5 [place] double lumen power injectable PICC was advanced to the lower SVC/right atrial junction. Fluoroscopy during the procedure and fluoro spot radiograph confirms appropriate catheter position. The catheter was flushed and covered with a sterile dressing. Catheter length: 35 cm IMPRESSION: Successful right arm Power PICC line placement with ultrasound and fluoroscopic guidance. The catheter is ready for use. Read by: Rowe Robert, PA-C Electronically Signed   By: Aletta Edouard M.D.   On: 09/25/2015 17:05   [place] Mfm Fetal  Bpp Wo Non Stress  10/13/2015  OBSTETRICAL ULTRASOUND: This exam was performed within a [facility] Ultrasound Department. The OB US report was generated in the AS system, and faxed to the ordering physician.  This report is available in the [facility]. See the AS Obstetric US report via the Image Link.  [place] Mfm Fetal Bpp Wo Non Stress  10/06/2015  OBSTETRICAL ULTRASOUND: This exam was performed within a [facility] [facility]trasound Department. The OB US report was generated in the AS system, and faxed to the ordering physician.  This report is available in the [facility]. See the AS Obstetric US report via the Image Link.  [place] Mfm Fetal Bpp Wo Non Stress  09/30/2015  OBSTETRICAL ULTRASOUND: This exam was performed within a [place] [facility]trasound Department. The OB US report was generated in the AS system, and faxed to the ordering physician.  This report is available in the [facility]. See the AS Obstetric US report via the Image Link.  [place] Mfm Fetal Bpp Wo Non Stress  09/22/2015  OBSTETRICAL ULTRASOUND: This exam was performed within a [place] [facility]trasound Department. The OB US report was generated in the AS system, and faxed to the ordering physician.  This report is available in the [facility]. See the AS Obstetric US report via the Image Link.  [place] Mfm Ob Follow Up  10/13/2015  OBSTETRICAL ULTRASOUND: This exam was performed within a [facility]trasound Department. The OB US report was generated in the AS system, and faxed to the ordering physician.  This report is available in the [facility]. See the AS Obstetric US report via the Image Link.  [place] Mfm Ob Follow Up  09/22/2015  OBSTETRICAL ULTRASOUND: This exam was performed within a [facility] Ultrasound Department. The OB US report was generated in the AS system, and faxed to the ordering physician.  This report is available in the [facility]. See the AS Obstetric US report via the Image Link.   Scheduled Meds: . enoxaparin (LOVENOX)  injection  40 mg Subcutaneous Q24H  . folic acid  1 mg Oral Daily  . HYDROmorphone   Intravenous Q4H  . methadone  5 mg Oral Daily  . prenatal multivitamin  1 tablet Oral Q1200  . senna-docusate  1 tablet Oral BID   Continuous Infusions: . dextrose 5 % and 0.45% NaCl 100 mL/hr at 10/16/15 0440    Time spent 25 minutes.

## 2015-10-16 NOTE — Progress Notes (Signed)
1630-1800: Withdrew 1000cc blood from PICC per MD order. RN aware and vitals monitored. Pt. Tolerated procedure well.

## 2015-10-17 DIAGNOSIS — D5700 Hb-SS disease with crisis, unspecified: Secondary | ICD-10-CM

## 2015-10-17 DIAGNOSIS — Z3A35 35 weeks gestation of pregnancy: Secondary | ICD-10-CM

## 2015-10-17 DIAGNOSIS — B951 Streptococcus, group B, as the cause of diseases classified elsewhere: Secondary | ICD-10-CM

## 2015-10-17 DIAGNOSIS — O99013 Anemia complicating pregnancy, third trimester: Principal | ICD-10-CM

## 2015-10-17 LAB — CULTURE, BETA STREP (GROUP B ONLY)

## 2015-10-17 LAB — OB RESULTS CONSOLE GBS: GBS: POSITIVE

## 2015-10-17 MED ORDER — VALACYCLOVIR HCL 500 MG PO TABS
500.0000 mg | ORAL_TABLET | Freq: Two times a day (BID) | ORAL | Status: DC
  Administered 2015-10-17 – 2015-10-18 (×2): 500 mg via ORAL
  Filled 2015-10-17 (×3): qty 1

## 2015-10-17 MED ORDER — VALACYCLOVIR HCL 500 MG PO TABS
500.0000 mg | ORAL_TABLET | Freq: Every day | ORAL | Status: DC
  Administered 2015-10-17: 500 mg via ORAL
  Filled 2015-10-17: qty 1

## 2015-10-17 MED ORDER — SODIUM CHLORIDE 0.9 % IV BOLUS (SEPSIS)
500.0000 mL | Freq: Once | INTRAVENOUS | Status: AC
  Administered 2015-10-17: 500 mL via INTRAVENOUS

## 2015-10-17 NOTE — Progress Notes (Signed)
Patient ID: [name], female   DOB: 1990/09/30, 25 y.o.   MRN: [id number] SICKLE CELL SERVICE PROGRESS NOTE  EMMILEE ORSI [id number] DOB: [birth year] DOA: 10/15/2015 PCP: Angelica Chessman, MD  Assessment/Plan: Active Problems:   Hb-SS disease with crisis ([place])   Sickle cell crisis ([place])  1. Hb SS with crisis: Continue PCA and clinician assisted Dilaudid at current doses. Give 500 cc NS bolus and increase IVF rate to 125 cc/hr for some dehydration and mild to moderate variability on fetal monitoring.  2. Anemia of chronic disease: Patient had exchanged transfusion yesterday. Awaiting post transfusion test, ordered 3. Chronic pain: Continue methadone 5 mg daily at bedtime. 4. Third trimester pregnancy: Approximately [redacted] weeks gestation age. She's been evaluated by obstetrics and DOB rapid response team will follow her daily over here. Patient is + for HSV and GBS. Discussed with Dr. Nehemiah Settle, patient has been started on Valtrex, GBS will be treated at the time of delivery, no need to treat immediately.  Code Status: Full Code Family Communication: N/A Disposition Plan: Not yet ready for discharge  [place]  If 7PM-7AM, please contact night-coverage.  10/17/2015, 2:52 PM  LOS: 1 day   Interval History: Patient is + for HSV and GBS. Discussed with Dr. Nehemiah Settle, patient has been started on Valtrex, GBS will be treated at the time of delivery, no need to treat immediately. Patient said pain is better but still at 7/10. Had exchange transfusion yesterday, exchanged 1 L for 2 units of Packed Red Cells.   Consultants:  Obstetrician  Procedures:  None  Antibiotics:  None  Objective: Filed Vitals:   10/17/15 0722 10/17/15 0935 10/17/15 1123 10/17/15 1335  BP:  120/71  126/78  Pulse:  93  99  Temp:  98.6 F (37 C)  98.4 F (36.9 C)  TempSrc:  Oral  Oral  Resp: 17 17 20 18   Height:      Weight:      SpO2: 98% 97%  96%   Weight change:   Intake/Output  Summary (Last 24 hours) at 10/17/15 1452 Last data filed at 10/17/15 1341  Gross per 24 hour  Intake 6277.5 ml  Output   4250 ml  Net 2027.5 ml    General: Alert, awake, oriented x3, in no acute distress.  HEENT: [place]/AT [place]ER[place], EOMI Neck: Trachea midline,  no masses, no thyromegal,y no JVD, no carotid bruit OROPHARYNX:  Moist, No exudate/ erythema/lesions.  Heart: Regular rate and rhythm, without murmurs, rubs, gallops, PMI non-displaced, no heaves or thrills on palpation.  Lungs: Clear to auscultation, no wheezing or rhonchi noted. No increased vocal fremitus resonant to percussion  Abdomen: Gravid, Soft, nontender, positive bowel sounds, no masses no hepatosplenomegaly noted..  Neuro: No focal neurological deficits noted cranial nerves II through XII grossly intact. DTRs 2+ bilaterally upper and lower extremities. Strength 5 out of 5 in bilateral upper and lower extremities. Musculoskeletal: No warm swelling or erythema around joints, no spinal tenderness noted. Psychiatric: Patient alert and oriented x3, good insight and cognition, good recent to remote recall. Lymph node survey: No cervical axillary or inguinal lymphadenopathy noted.  Data Reviewed: Basic Metabolic Panel:  Recent Labs Lab 10/15/15 0023 10/15/15 1729  NA 139 137  K 3.8 3.7  CL 106 109  CO2  --  22  GLUCOSE 92 85  BUN 4* <5*  CREATININE 0.50 0.45  CALCIUM  --  8.3*   Liver Function Tests: No results for input(s): AST, ALT, ALKPHOS, BILITOT, PROT, ALBUMIN  in the last 168 hours. No results for input(s): LIPASE, AMYLASE in the last 168 hours. No results for input(s): AMMONIA in the last 168 hours. CBC:  Recent Labs Lab 10/15/15 0014 10/15/15 0023 10/15/15 1215 10/15/15 1933  WBC 15.0*  --  14.8* 13.1*  NEUTROABS 11.2*  --   --  9.9*  HGB 8.7* 9.2* 8.9* 8.3*  HCT 25.7* 27.0* 26.5* 23.6*  MCV 86.8  --  87.2 86.4  PLT 411*  --  421* 380   Cardiac Enzymes: No results for input(s): CKTOTAL, CKMB,  CKMBINDEX, TROPONINI in the last 168 hours. BNP (last 3 results) No results for input(s): BNP in the last 8760 hours.  ProBNP (last 3 results) No results for input(s): PROBNP in the last 8760 hours.  CBG: No results for input(s): GLUCAP in the last 168 hours.  Recent Results (from the past 240 hour(s))  Culture, beta strep (group b only)     Status: Abnormal   Collection Time: 10/15/15 11:35 AM  Result Value Ref Range Status   Specimen Description VAGINAL/RECTAL  Final   Special Requests NONE  Final   Culture (A)  Final    GROUP B STREP(S.AGALACTIAE)ISOLATED CRITICAL RESULT CALLED TO, READ BACK BY AND VERIFIED WITH: K OSBOURNE 10/17/15 @ 1229 M VESTAL Performed at [facility]    Report Status 10/17/2015 FINAL  Final  Wet prep, genital     Status: Abnormal   Collection Time: 10/15/15 11:35 AM  Result Value Ref Range Status   Yeast Wet Prep HPF POC NONE SEEN NONE SEEN Final   Trich, Wet Prep NONE SEEN NONE SEEN Final   Clue Cells Wet Prep HPF POC NONE SEEN NONE SEEN Final   WBC, Wet Prep HPF POC MANY (A) NONE SEEN Final    Comment: MODERATE BACTERIA SEEN   Sperm NONE SEEN  Final     Studies: Ir Fluoro Guide Cv Line Right  10/16/2015  CLINICAL DATA:  Sickle cell anemia with crisis during pregnancy. Poor IV access and need for PICC line. EXAM: POWER PICC LINE PLACEMENT WITH ULTRASOUND AND FLUOROSCOPIC GUIDANCE FLUOROSCOPY TIME:  54 seconds. PROCEDURE: The patient was advised of the possible risks and complications and agreed to undergo the procedure. The patient was then brought to the angiographic suite for the procedure. The right arm was prepped with chlorhexidine, draped in the usual sterile fashion using maximum barrier technique (cap and mask, sterile gown, sterile gloves, large sterile sheet, hand hygiene and cutaneous antisepsis) and infiltrated locally with 1% Lidocaine. Ultrasound demonstrated patency of the right brachial vein, and this was documented with an image.  Under real-time ultrasound guidance, this vein was accessed with a 21 gauge micropuncture needle and image documentation was performed. A 0.018 wire was introduced in to the vein. Over this, a 5.0 [place] dual lumen power injectable PICC was advanced to the lower SVC/right atrial junction. Fluoroscopy during the procedure and fluoro spot radiograph confirms appropriate catheter position. The catheter was flushed and covered with a sterile dressing. Catheter length: 33 cm COMPLICATIONS: None IMPRESSION: Successful right arm power injectable PICC line placement with ultrasound and fluoroscopic guidance. The catheter is ready for use. Electronically Signed   By: Aletta Edouard M.D.   On: 10/16/2015 15:43   Ir Fluoro Guide Cv Line Right  09/25/2015  INDICATION: Sickle-cell crisis, [redacted] weeks pregnant, poor venous access. Request made for central venous access for medications /fluids. EXAM: RIGHT UPPER EXTREMITY PICC LINE PLACEMENT WITH ULTRASOUND AND FLUOROSCOPIC GUIDANCE MEDICATIONS: 1%  lidocaine to skin and subcutaneous tissue ANESTHESIA/SEDATION: None FLUOROSCOPY TIME:  Fluoroscopy Time:  24 seconds COMPLICATIONS: None immediate. PROCEDURE: The patient was advised of the possible risks and complications and agreed to undergo the procedure. The patient was then brought to the angiographic suite for the procedure. The right arm was prepped with chlorhexidine, draped in the usual sterile fashion using maximum barrier technique (cap and mask, sterile gown, sterile gloves, large sterile sheet, hand hygiene and cutaneous antisepsis) and infiltrated locally with 1% Lidocaine. Ultrasound demonstrated patency of the right basilic vein, and this was documented with an image. Under real-time ultrasound guidance, this vein was accessed with a 21 gauge micropuncture needle and image documentation was performed. A 0.018 wire was introduced in to the vein. Over this, a 5 [place] double lumen power injectable PICC was advanced to  the lower SVC/right atrial junction. Fluoroscopy during the procedure and fluoro spot radiograph confirms appropriate catheter position. The catheter was flushed and covered with a sterile dressing. Catheter length: 35 cm IMPRESSION: Successful right arm Power PICC line placement with ultrasound and fluoroscopic guidance. The catheter is ready for use. Read by: Rowe Robert, PA-C Electronically Signed   By: Aletta Edouard M.D.   On: 09/25/2015 17:05   Ir US Guide Vasc Access Right  10/16/2015  CLINICAL DATA:  Sickle cell anemia with crisis during pregnancy. Poor IV access and need for PICC line. EXAM: POWER PICC LINE PLACEMENT WITH ULTRASOUND AND FLUOROSCOPIC GUIDANCE FLUOROSCOPY TIME:  54 seconds. PROCEDURE: The patient was advised of the possible risks and complications and agreed to undergo the procedure. The patient was then brought to the angiographic suite for the procedure. The right arm was prepped with chlorhexidine, draped in the usual sterile fashion using maximum barrier technique (cap and mask, sterile gown, sterile gloves, large sterile sheet, hand hygiene and cutaneous antisepsis) and infiltrated locally with 1% Lidocaine. Ultrasound demonstrated patency of the right brachial vein, and this was documented with an image. Under real-time ultrasound guidance, this vein was accessed with a 21 gauge micropuncture needle and image documentation was performed. A 0.018 wire was introduced in to the vein. Over this, a 5.0 [place] dual lumen power injectable PICC was advanced to the lower SVC/right atrial junction. Fluoroscopy during the procedure and fluoro spot radiograph confirms appropriate catheter position. The catheter was flushed and covered with a sterile dressing. Catheter length: 33 cm COMPLICATIONS: None IMPRESSION: Successful right arm power injectable PICC line placement with ultrasound and fluoroscopic guidance. The catheter is ready for use. Electronically Signed   By: Aletta Edouard M.D.    On: 10/16/2015 15:43   Ir US Guide Vasc Access Right  09/25/2015  INDICATION: Sickle-cell crisis, [redacted] weeks pregnant, poor venous access. Request made for central venous access for medications /fluids. EXAM: RIGHT UPPER EXTREMITY PICC LINE PLACEMENT WITH ULTRASOUND AND FLUOROSCOPIC GUIDANCE MEDICATIONS: 1% lidocaine to skin and subcutaneous tissue ANESTHESIA/SEDATION: None FLUOROSCOPY TIME:  Fluoroscopy Time:  24 seconds COMPLICATIONS: None immediate. PROCEDURE: The patient was advised of the possible risks and complications and agreed to undergo the procedure. The patient was then brought to the angiographic suite for the procedure. The right arm was prepped with chlorhexidine, draped in the usual sterile fashion using maximum barrier technique (cap and mask, sterile gown, sterile gloves, large sterile sheet, hand hygiene and cutaneous antisepsis) and infiltrated locally with 1% Lidocaine. Ultrasound demonstrated patency of the right basilic vein, and this was documented with an image. Under real-time ultrasound guidance, this vein was accessed with  a 21 gauge micropuncture needle and image documentation was performed. A 0.018 wire was introduced in to the vein. Over this, a 5 [place] double lumen power injectable PICC was advanced to the lower SVC/right atrial junction. Fluoroscopy during the procedure and fluoro spot radiograph confirms appropriate catheter position. The catheter was flushed and covered with a sterile dressing. Catheter length: 35 cm IMPRESSION: Successful right arm Power PICC line placement with ultrasound and fluoroscopic guidance. The catheter is ready for use. Read by: Rowe Robert, PA-C Electronically Signed   By: Aletta Edouard M.D.   On: 09/25/2015 17:05   [place] Mfm Fetal Bpp Wo Non Stress  10/13/2015  OBSTETRICAL ULTRASOUND: This exam was performed within a [place] Ultrasound Department. The OB US report was generated in the AS system, and faxed to the ordering physician.  This  report is available in the [facility]. See the AS Obstetric US report via the Image Link.  [place] Mfm Fetal Bpp Wo Non Stress  10/06/2015  OBSTETRICAL ULTRASOUND: This exam was performed within a [place] Ultrasound Department. The OB US report was generated in the AS system, and faxed to the ordering physician.  This report is available in the [facility]. See the AS Obstetric US report via the Image Link.  [place] Mfm Fetal Bpp Wo Non Stress  09/30/2015  OBSTETRICAL ULTRASOUND: This exam was performed within a [place] Ultrasound Department. The OB US report was generated in the AS system, and faxed to the ordering physician.  This report is available in the [facility]. See the [facility] Obstetric US report via the Image [facility].  [place] Mfm Fetal Bpp Wo Non Stress  09/22/2015  OBSTETRICAL ULTRASOUND: This exam was performed within a [place] Ultrasound Department. The OB US report was generated in the AS system, and faxed to the ordering physician.  This report is available in the [facility]. See the AS Obstetric US report via the [facility].  [place] Mfm Ob Follow Up  10/13/2015  OBSTETRICAL ULTRASOUND: This exam was performed within a [place] Ultrasound Department. The OB US report was generated in the AS system, and faxed to the ordering physician.  This report is available in the [facility]. See the [facility] Obstetric US report via the Image [facility].  [place] Mfm Ob Follow Up  09/22/2015  OBSTETRICAL ULTRASOUND: This exam was performed within a  Ultrasound Department. The OB US report was generated in the AS system, and faxed to the ordering physician.  This report is available in the [facility]. See the [facility] Obstetric US report via the [facility].   Scheduled Meds: . enoxaparin (LOVENOX) injection  40 mg Subcutaneous Q24H  . folic acid  1 mg Oral Daily  . HYDROmorphone   Intravenous Q4H  . methadone  5 mg Oral Daily  . prenatal multivitamin  1 tablet Oral Q1200  . senna-docusate  1 tablet Oral BID  .  valACYclovir  500 mg Oral BID   Continuous Infusions: . dextrose 5 % and 0.45% NaCl 125 mL/hr at 10/17/15 1118    Active Problems:   Hb-SS disease with crisis ([place])   Sickle cell crisis ([place])

## 2015-10-17 NOTE — Progress Notes (Signed)
CRITICAL VALUE ALERT  Critical value received:  Group Strep B+  Date of notification:  10/17/15  Time of notification:  12:29  Critical value read back:Yes.    Nurse who received alert:  Laural Benes  MD notified (1st page):  Dr. Doreene Burke  Time of first page:  1230  MD notified (2nd page):  Time of second page:  Responding MD:  Dr. Doreene Burke  Time MD responded:  1230

## 2015-10-17 NOTE — Progress Notes (Signed)
Patient ID: [name], female   DOB: [birth year], 25 y.o.   MRN: [id number]  NST reviewed.  Baseline 130, mod variability.  > 2 accelerations.  No decelerations.  Reactive tracing.  Vitals reviewed.  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  Truett Mainland, DO 10/17/2015 12:17 PM

## 2015-10-17 NOTE — Progress Notes (Signed)
Sickle cell MD here. Orders received for IVF bolus.

## 2015-10-17 NOTE — Progress Notes (Signed)
FHR baseline 130BPM, min- mod variability, no decels, 10x10 accels. Pt on her left side. Pt drank 4oz of applie juice.

## 2015-10-18 ENCOUNTER — Encounter: Payer: Self-pay | Admitting: Obstetrics and Gynecology

## 2015-10-18 DIAGNOSIS — D571 Sickle-cell disease without crisis: Secondary | ICD-10-CM

## 2015-10-18 DIAGNOSIS — B009 Herpesviral infection, unspecified: Secondary | ICD-10-CM

## 2015-10-18 DIAGNOSIS — O98519 Other viral diseases complicating pregnancy, unspecified trimester: Secondary | ICD-10-CM

## 2015-10-18 DIAGNOSIS — B951 Streptococcus, group B, as the cause of diseases classified elsewhere: Secondary | ICD-10-CM | POA: Insufficient documentation

## 2015-10-18 LAB — CBC WITH DIFFERENTIAL/PLATELET
BASOS ABS: 0 10*3/uL (ref 0.0–0.1)
Basophils Relative: 0 %
EOS ABS: 0.5 10*3/uL (ref 0.0–0.7)
EOS PCT: 3 %
HCT: 22.8 % — ABNORMAL LOW (ref 36.0–46.0)
HEMOGLOBIN: 7.9 g/dL — AB (ref 12.0–15.0)
LYMPHS PCT: 12 %
Lymphs Abs: 1.9 10*3/uL (ref 0.7–4.0)
MCH: 30.3 pg (ref 26.0–34.0)
MCHC: 34.6 g/dL (ref 30.0–36.0)
MCV: 87.4 fL (ref 78.0–100.0)
Monocytes Absolute: 1.1 10*3/uL — ABNORMAL HIGH (ref 0.1–1.0)
Monocytes Relative: 7 %
NEUTROS PCT: 78 %
Neutro Abs: 12.7 10*3/uL — ABNORMAL HIGH (ref 1.7–7.7)
PLATELETS: 291 10*3/uL (ref 150–400)
RBC: 2.61 MIL/uL — AB (ref 3.87–5.11)
RDW: 20.3 % — ABNORMAL HIGH (ref 11.5–15.5)
WBC: 16.3 10*3/uL — AB (ref 4.0–10.5)

## 2015-10-18 LAB — BASIC METABOLIC PANEL
ANION GAP: 6 (ref 5–15)
BUN: <5 mg/dL — ABNORMAL LOW (ref 6–20)
CO2: 23 mmol/L (ref 22–32)
Calcium: 8.1 mg/dL — ABNORMAL LOW (ref 8.9–10.3)
Chloride: 107 mmol/L (ref 101–111)
Creatinine, Ser: 0.52 mg/dL (ref 0.44–1.00)
GFR calc Af Amer: >60 mL/min (ref >60)
Glucose, Bld: 121 mg/dL — ABNORMAL HIGH (ref 65–99)
POTASSIUM: 3.4 mmol/L — AB (ref 3.5–5.1)
SODIUM: 136 mmol/L (ref 135–145)

## 2015-10-18 MED ORDER — VALACYCLOVIR HCL 500 MG PO TABS: 500.0000 mg | ORAL_TABLET | Freq: Two times a day (BID) | ORAL | Status: DC

## 2015-10-18 MED ORDER — HEPARIN SOD (PORK) LOCK FLUSH 100 UNIT/ML IV SOLN
250.0000 [IU] | INTRAVENOUS | Status: AC | PRN
  Administered 2015-10-18: 250 [IU]
  Filled 2015-10-18: qty 5

## 2015-10-18 MED ORDER — SODIUM CHLORIDE 0.9% FLUSH
10.0000 mL | INTRAVENOUS | Status: AC | PRN
  Administered 2015-10-18: 10 mL

## 2015-10-18 NOTE — Discharge Summary (Signed)
Physician Discharge Summary  [name] [id number] DOB: 03-06-1991 DOA: 10/15/2015  PCP: Angelica Chessman, MD  Admit date: 10/15/2015 Discharge date: 10/18/2015  Discharge Diagnoses:  Active Problems:   Hb-SS disease with crisis ([place])   Sickle cell crisis [facility])  Discharge Condition: Stable  Diet: Regular   Wt Readings from Last 3 Encounters:  10/15/15 79.379 kg (175 lb)  10/13/15 79.493 kg (175 lb 4 oz)  10/10/15 85.6 kg (188 lb 11.4 oz)   History of present illness:  This is an opiate tolerant patient with Hb SS who was discharged from the hospital less than one week prior with sickle cell crisis. The patient is pregnant and near term and additionally has a high level of hemoglobin SS. This makes her particularly susceptible for increased crises until she delivers the baby. She presented to [facility] emergency room for evaluation. The fetus was evaluated and found to be within normal limits. She continued to have pain and we are asked to see her for admission. I had a discussion with the OB/GYN Dr. Ilda Basset at [facility] hospital. Initial plan was made that the patient would go to the day hospital for type and cross for blood and then come back tomorrow to have partial exchange transfusion performed. However the patient was transferred to [facility] ED by Dr. Ilda Basset and I have been called to see the patient for evaluation for sickle cell crisis. In the emergency room at [facility] hospital she received 3 doses of Dilaudid 2 mg over the last 3 hours. Currently the patient rates her pain as 10 out of 10 localized mainly to her low back and right lower extremity. She describes the pain as being consistent with her usual sickle cell crisis. She denies any nausea, vomiting, fever, chills, contractions. Focal weakness, headaches or neck stiffness. I am asked to admit her for sickle cell crisis.   Hospital Course:  Patient was managed with Dilaudid PCA, clinician assisted doses and other  adjunct therapies including IVF. No Toradol because of pregnancy. Patient got partial exchange transfusion, 1 liter of blood exchanged for 2 units of packed red cell on 10/16/2015. 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 Discussed with Dr. Nehemiah Settle, patient has been started on Valtrex, GBS will be treated at the time of delivery, no need to treat immediately. Patient said pain is better and her sickle cell pain returned to baseline, she had no new symptom. She was discharged home in stable condition. She has appointment with OB on Tuesday 10/20/2015 and Sickle cell clinic same day. She was given prescription for Valtrex upon discharge. She will continue her pain medications of Methadone and Oxycodone at the current home doses, will review during outpatient visit.  Discharge Exam: Filed Vitals:   10/18/15 0344 10/18/15 0607  BP:  107/62  Pulse:  106  Temp:  97.6 F (36.4 C)  Resp: 14 14   Filed Vitals:   10/18/15 0012 10/18/15 0207 10/18/15 0344 10/18/15 0607  BP:  114/65  107/62  Pulse:  106  106  Temp:  97.6 F (36.4 C)  97.6 F (36.4 C)  TempSrc:  Oral  Oral  Resp: 16 16 14 14   Height:      Weight:      SpO2:  96%  100%   Discharge Instructions    Medication List    TAKE these medications        calcium carbonate 500 MG chewable tablet  Commonly known as:  TUMS - dosed in mg  elemental calcium  Chew 2 tablets by mouth 2 (two) times daily as needed for indigestion or heartburn.     folic acid 1 MG tablet  Commonly known as:  FOLVITE  Take 1 mg by mouth daily.     methadone 5 MG tablet  Commonly known as:  DOLOPHINE  Take 1 tablet (5 mg total) by mouth daily.     oxyCODONE 15 MG immediate release tablet  Commonly known as:  ROXICODONE  Take 1 tablet (15 mg total) by mouth every 4 (four) hours as needed for pain.     prenatal multivitamin Tabs tablet  Take 1 tablet by mouth daily at 12 noon.     TYLENOL 325 MG tablet  Generic drug:  acetaminophen  Take 650 mg by mouth every  6 (six) hours as needed for moderate pain.     valACYclovir 500 MG tablet  Commonly known as:  VALTREX  Take 1 tablet (500 mg total) by mouth 2 (two) times daily.        The results of significant diagnostics from this hospitalization (including imaging, microbiology, ancillary and laboratory) are listed below for reference.    Significant Diagnostic Studies: Ir Fluoro Guide Cv Line Right  10/16/2015  CLINICAL DATA:  Sickle cell anemia with crisis during pregnancy. Poor IV access and need for PICC line. EXAM: POWER PICC LINE PLACEMENT WITH ULTRASOUND AND FLUOROSCOPIC GUIDANCE FLUOROSCOPY TIME:  54 seconds. PROCEDURE: The patient was advised of the possible risks and complications and agreed to undergo the procedure. The patient was then brought to the angiographic suite for the procedure. The right arm was prepped with chlorhexidine, draped in the usual sterile fashion using maximum barrier technique (cap and mask, sterile gown, sterile gloves, large sterile sheet, hand hygiene and cutaneous antisepsis) and infiltrated locally with 1% Lidocaine. Ultrasound demonstrated patency of the right brachial vein, and this was documented with an image. Under real-time ultrasound guidance, this vein was accessed with a 21 gauge micropuncture needle and image documentation was performed. A 0.018 wire was introduced in to the vein. Over this, a 5.0 [place] dual lumen power injectable PICC was advanced to the lower SVC/right atrial junction. Fluoroscopy during the procedure and fluoro spot radiograph confirms appropriate catheter position. The catheter was flushed and covered with a sterile dressing. Catheter length: 33 cm COMPLICATIONS: None IMPRESSION: Successful right arm power injectable PICC line placement with ultrasound and fluoroscopic guidance. The catheter is ready for use. Electronically Signed   By: Aletta Edouard M.D.   On: 10/16/2015 15:43   Ir Fluoro Guide Cv Line Right  09/25/2015  INDICATION:  Sickle-cell crisis, [redacted] weeks pregnant, poor venous access. Request made for central venous access for medications /fluids. EXAM: RIGHT UPPER EXTREMITY PICC LINE PLACEMENT WITH ULTRASOUND AND FLUOROSCOPIC GUIDANCE MEDICATIONS: 1% lidocaine to skin and subcutaneous tissue ANESTHESIA/SEDATION: None FLUOROSCOPY TIME:  Fluoroscopy Time:  24 seconds COMPLICATIONS: None immediate. PROCEDURE: The patient was advised of the possible risks and complications and agreed to undergo the procedure. The patient was then brought to the angiographic suite for the procedure. The right arm was prepped with chlorhexidine, draped in the usual sterile fashion using maximum barrier technique (cap and mask, sterile gown, sterile gloves, large sterile sheet, hand hygiene and cutaneous antisepsis) and infiltrated locally with 1% Lidocaine. Ultrasound demonstrated patency of the right basilic vein, and this was documented with an image. Under real-time ultrasound guidance, this vein was accessed with a 21 gauge micropuncture needle and image documentation was performed. A  0.018 wire was introduced in to the vein. Over this, a 5 [place] double lumen power injectable PICC was advanced to the lower SVC/right atrial junction. Fluoroscopy during the procedure and fluoro spot radiograph confirms appropriate catheter position. The catheter was flushed and covered with a sterile dressing. Catheter length: 35 cm IMPRESSION: Successful right arm Power PICC line placement with ultrasound and fluoroscopic guidance. The catheter is ready for use. Read by: Rowe Robert, PA-C Electronically Signed   By: Aletta Edouard M.D.   On: 09/25/2015 17:05   Ir US Guide Vasc Access Right  10/16/2015  CLINICAL DATA:  Sickle cell anemia with crisis during pregnancy. Poor IV access and need for PICC line. EXAM: POWER PICC LINE PLACEMENT WITH ULTRASOUND AND FLUOROSCOPIC GUIDANCE FLUOROSCOPY TIME:  54 seconds. PROCEDURE: The patient was advised of the possible risks and  complications and agreed to undergo the procedure. The patient was then brought to the angiographic suite for the procedure. The right arm was prepped with chlorhexidine, draped in the usual sterile fashion using maximum barrier technique (cap and mask, sterile gown, sterile gloves, large sterile sheet, hand hygiene and cutaneous antisepsis) and infiltrated locally with 1% Lidocaine. Ultrasound demonstrated patency of the right brachial vein, and this was documented with an image. Under real-time ultrasound guidance, this vein was accessed with a 21 gauge micropuncture needle and image documentation was performed. A 0.018 wire was introduced in to the vein. Over this, a 5.0 [place] dual lumen power injectable PICC was advanced to the lower SVC/right atrial junction. Fluoroscopy during the procedure and fluoro spot radiograph confirms appropriate catheter position. The catheter was flushed and covered with a sterile dressing. Catheter length: 33 cm COMPLICATIONS: None IMPRESSION: Successful right arm power injectable PICC line placement with ultrasound and fluoroscopic guidance. The catheter is ready for use. Electronically Signed   By: Aletta Edouard M.D.   On: 10/16/2015 15:43   Ir US Guide Vasc Access Right  09/25/2015  INDICATION: Sickle-cell crisis, [redacted] weeks pregnant, poor venous access. Request made for central venous access for medications /fluids. EXAM: RIGHT UPPER EXTREMITY PICC LINE PLACEMENT WITH ULTRASOUND AND FLUOROSCOPIC GUIDANCE MEDICATIONS: 1% lidocaine to skin and subcutaneous tissue ANESTHESIA/SEDATION: None FLUOROSCOPY TIME:  Fluoroscopy Time:  24 seconds COMPLICATIONS: None immediate. PROCEDURE: The patient was advised of the possible risks and complications and agreed to undergo the procedure. The patient was then brought to the angiographic suite for the procedure. The right arm was prepped with chlorhexidine, draped in the usual sterile fashion using maximum barrier technique (cap and mask,  sterile gown, sterile gloves, large sterile sheet, hand hygiene and cutaneous antisepsis) and infiltrated locally with 1% Lidocaine. Ultrasound demonstrated patency of the right basilic vein, and this was documented with an image. Under real-time ultrasound guidance, this vein was accessed with a 21 gauge micropuncture needle and image documentation was performed. A 0.018 wire was introduced in to the vein. Over this, a 5 [place] double lumen power injectable PICC was advanced to the lower SVC/right atrial junction. Fluoroscopy during the procedure and fluoro spot radiograph confirms appropriate catheter position. The catheter was flushed and covered with a sterile dressing. Catheter length: 35 cm IMPRESSION: Successful right arm Power PICC line placement with ultrasound and fluoroscopic guidance. The catheter is ready for use. Read by: Rowe Robert, PA-C Electronically Signed   By: Aletta Edouard M.D.   On: 09/25/2015 17:05   [place] Mfm Fetal Bpp Wo Non Stress  10/13/2015  OBSTETRICAL ULTRASOUND: This exam was performed within a  [place] [facility]. The OB US report was generated in the AS system, and faxed to the ordering physician.  This report is available in the [facility]. See the AS Obstetric US report via the Image Link.  [place] Mfm Fetal Bpp Wo Non Stress  10/06/2015  OBSTETRICAL ULTRASOUND: This exam was performed within a [place] [facility] Department. The OB US report was generated in the AS system, and faxed to the ordering physician.  This report is available in the [facility]. See the AS Obstetric US report via the Image Link.  [place] Mfm Fetal Bpp Wo Non Stress  09/30/2015  OBSTETRICAL ULTRASOUND: This exam was performed within a [place] [facility]ound Department. The OB US report was generated in the AS system, and faxed to the ordering physician.  This report is available in the [facility]. See the AS Obstetric US report via the Image Link.  [place] Mfm Fetal Bpp Wo Non  Stress  09/22/2015  OBSTETRICAL ULTRASOUND: This exam was performed within a [place] [facility]trasound Department. The OB US report was generated in the AS system, and faxed to the ordering physician.  This report is available in the [facility]. See the AS Obstetric US report via the Image Link.  [place] Mfm Ob Follow Up  10/13/2015  OBSTETRICAL ULTRASOUND: This exam was performed within a [place] [facility] Department. The OB US report was generated in the AS system, and faxed to the ordering physician.  This report is available in the [facility]. See the AS Obstetric US report via the Image Link.  [place] Mfm Ob Follow Up  09/22/2015  OBSTETRICAL ULTRASOUND: This exam was performed within a [place] [facility]trasound Department. The OB US report was generated in the AS system, and faxed to the ordering physician.  This report is available in the [facility]. See the AS Obstetric US report via the Image Link.   Microbiology: Recent Results (from the past 240 hour(s))  Culture, beta strep (group b only)     Status: Abnormal   Collection Time: 10/15/15 11:35 AM  Result Value Ref Range Status   Specimen Description VAGINAL/RECTAL  Final   Special Requests NONE  Final   Culture (A)  Final    GROUP B STREP(S.AGALACTIAE)ISOLATED CRITICAL RESULT CALLED TO, READ BACK BY AND VERIFIED WITH: K OSBOURNE 10/17/15 @ 1229 M VESTAL Performed at [facility]    Report Status 10/17/2015 FINAL  Final  Wet prep, genital     Status: Abnormal   Collection Time: 10/15/15 11:35 AM  Result Value Ref Range Status   Yeast Wet Prep HPF POC NONE SEEN NONE SEEN Final   Trich, Wet Prep NONE SEEN NONE SEEN Final   Clue Cells Wet Prep HPF POC NONE SEEN NONE SEEN Final   WBC, Wet Prep HPF POC MANY (A) NONE SEEN Final    Comment: MODERATE BACTERIA SEEN   Sperm NONE SEEN  Final     Labs: Basic Metabolic Panel:  Recent Labs Lab 10/15/15 0023 10/15/15 1729 10/18/15 0627  NA 139 137 136  K 3.8 3.7 3.4*  CL 106 109 107   CO2  --  22 23  GLUCOSE 92 85 121*  BUN 4* <5* <5*  CREATININE 0.50 0.45 0.52  CALCIUM  --  8.3* 8.1*   Liver Function Tests: No results for input(s): AST, ALT, ALKPHOS, BILITOT, PROT, ALBUMIN in the last 168 hours. No results for input(s): LIPASE, AMYLASE in the last 168 hours. No results for  input(s): AMMONIA in the last 168 hours. CBC:  Recent Labs Lab 10/15/15 0014 10/15/15 0023 10/15/15 1215 10/15/15 1933 10/18/15 0627  WBC 15.0*  --  14.8* 13.1* 16.3*  NEUTROABS 11.2*  --   --  9.9* 12.7*  HGB 8.7* 9.2* 8.9* 8.3* 7.9*  HCT 25.7* 27.0* 26.5* 23.6* 22.8*  MCV 86.8  --  87.2 86.4 87.4  PLT 411*  --  421* 380 291   Cardiac Enzymes: No results for input(s): CKTOTAL, CKMB, CKMBINDEX, TROPONINI in the last 168 hours. BNP: Invalid input(s): POCBNP CBG: No results for input(s): GLUCAP in the last 168 hours.  Time coordinating discharge: 35 minutes  Signed:  Mikhael Hendriks, [facility] 10/18/2015, 8:09 AM

## 2015-10-19 ENCOUNTER — Telehealth (HOSPITAL_COMMUNITY): Payer: Self-pay | Admitting: *Deleted

## 2015-10-19 LAB — TYPE AND SCREEN
ABO/RH(D): B POS
ANTIBODY SCREEN: NEGATIVE
DONOR AG TYPE: NEGATIVE
Donor AG Type: NEGATIVE
UNIT DIVISION: 0
Unit division: 0

## 2015-10-19 NOTE — Telephone Encounter (Signed)
Expand All Collapse All   Called patient to extend an invitation for a Sickle Cell Education Luncheon on June 21 at 1 pm. She said she may be in "labor" that day but will come if she can.

## 2015-10-20 ENCOUNTER — Telehealth: Payer: Self-pay

## 2015-10-20 ENCOUNTER — Encounter (HOSPITAL_COMMUNITY): Payer: Self-pay

## 2015-10-20 ENCOUNTER — Other Ambulatory Visit: Payer: Self-pay | Admitting: Internal Medicine

## 2015-10-20 ENCOUNTER — Ambulatory Visit (HOSPITAL_COMMUNITY)
Admission: RE | Admit: 2015-10-20 | Discharge: 2015-10-20 | Disposition: A | Discharge status: Home / Self Care | Payer: Medicare Other | Source: Ambulatory Visit | Attending: Internal Medicine | Admitting: Internal Medicine

## 2015-10-20 VITALS — BP 124/100 | HR 105 | Wt 185.1 lb

## 2015-10-20 DIAGNOSIS — O26893 Other specified pregnancy related conditions, third trimester: Secondary | ICD-10-CM | POA: Diagnosis not present

## 2015-10-20 DIAGNOSIS — D571 Sickle-cell disease without crisis: Secondary | ICD-10-CM

## 2015-10-20 DIAGNOSIS — O0990 Supervision of high risk pregnancy, unspecified, unspecified trimester: Secondary | ICD-10-CM

## 2015-10-20 DIAGNOSIS — Z3A36 36 weeks gestation of pregnancy: Secondary | ICD-10-CM | POA: Insufficient documentation

## 2015-10-20 DIAGNOSIS — G894 Chronic pain syndrome: Secondary | ICD-10-CM | POA: Diagnosis not present

## 2015-10-20 DIAGNOSIS — F329 Major depressive disorder, single episode, unspecified: Secondary | ICD-10-CM | POA: Diagnosis not present

## 2015-10-20 DIAGNOSIS — B009 Herpesviral infection, unspecified: Secondary | ICD-10-CM

## 2015-10-20 DIAGNOSIS — O99013 Anemia complicating pregnancy, third trimester: Secondary | ICD-10-CM | POA: Insufficient documentation

## 2015-10-20 DIAGNOSIS — O99320 Drug use complicating pregnancy, unspecified trimester: Secondary | ICD-10-CM

## 2015-10-20 DIAGNOSIS — M79603 Pain in arm, unspecified: Secondary | ICD-10-CM | POA: Diagnosis present

## 2015-10-20 DIAGNOSIS — F1910 Other psychoactive substance abuse, uncomplicated: Secondary | ICD-10-CM

## 2015-10-20 DIAGNOSIS — O98519 Other viral diseases complicating pregnancy, unspecified trimester: Principal | ICD-10-CM

## 2015-10-20 DIAGNOSIS — IMO0002 Reserved for concepts with insufficient information to code with codable children: Secondary | ICD-10-CM

## 2015-10-20 DIAGNOSIS — K219 Gastro-esophageal reflux disease without esophagitis: Secondary | ICD-10-CM | POA: Diagnosis not present

## 2015-10-20 DIAGNOSIS — Z87891 Personal history of nicotine dependence: Secondary | ICD-10-CM | POA: Diagnosis not present

## 2015-10-20 DIAGNOSIS — O99019 Anemia complicating pregnancy, unspecified trimester: Secondary | ICD-10-CM

## 2015-10-20 LAB — HEMOGLOBINOPATHY EVALUATION
HGB A2 QUANT: 5.2 % — AB (ref 0.7–3.1)
HGB C: 0 %
HGB F QUANT: 4.1 % — AB (ref 0.0–2.0)
HGB S QUANTITAION: 52.6 % — AB
Hgb A: 38.1 % — ABNORMAL LOW (ref 94.0–98.0)

## 2015-10-20 MED ORDER — OXYCODONE HCL 15 MG PO TABS: 15.0000 mg | ORAL_TABLET | ORAL | Status: DC | PRN

## 2015-10-20 NOTE — Telephone Encounter (Signed)
Pt is requesting a medication refill for her Oxycodone, 10mg . Thanks!

## 2015-10-20 NOTE — Telephone Encounter (Signed)
Refill request for oxycodone 10mg . LOV 09/22/2015. Please advise. Thanks!

## 2015-10-21 ENCOUNTER — Encounter (HOSPITAL_COMMUNITY): Payer: Self-pay | Admitting: *Deleted

## 2015-10-21 ENCOUNTER — Inpatient Hospital Stay (HOSPITAL_COMMUNITY)
Admission: AD | Admit: 2015-10-21 | Discharge: 2015-10-21 | Disposition: A | Discharge status: Home / Self Care | Payer: Medicare Other | Source: Ambulatory Visit | Attending: Obstetrics and Gynecology | Admitting: Obstetrics and Gynecology

## 2015-10-21 DIAGNOSIS — Z87891 Personal history of nicotine dependence: Secondary | ICD-10-CM | POA: Insufficient documentation

## 2015-10-21 DIAGNOSIS — G894 Chronic pain syndrome: Secondary | ICD-10-CM | POA: Insufficient documentation

## 2015-10-21 DIAGNOSIS — O99013 Anemia complicating pregnancy, third trimester: Secondary | ICD-10-CM | POA: Insufficient documentation

## 2015-10-21 DIAGNOSIS — B009 Herpesviral infection, unspecified: Secondary | ICD-10-CM

## 2015-10-21 DIAGNOSIS — D571 Sickle-cell disease without crisis: Secondary | ICD-10-CM | POA: Insufficient documentation

## 2015-10-21 DIAGNOSIS — O99320 Drug use complicating pregnancy, unspecified trimester: Secondary | ICD-10-CM

## 2015-10-21 DIAGNOSIS — IMO0002 Reserved for concepts with insufficient information to code with codable children: Secondary | ICD-10-CM

## 2015-10-21 DIAGNOSIS — Z3A36 36 weeks gestation of pregnancy: Secondary | ICD-10-CM | POA: Insufficient documentation

## 2015-10-21 DIAGNOSIS — O98513 Other viral diseases complicating pregnancy, third trimester: Secondary | ICD-10-CM

## 2015-10-21 DIAGNOSIS — O26893 Other specified pregnancy related conditions, third trimester: Secondary | ICD-10-CM | POA: Insufficient documentation

## 2015-10-21 DIAGNOSIS — F329 Major depressive disorder, single episode, unspecified: Secondary | ICD-10-CM | POA: Insufficient documentation

## 2015-10-21 DIAGNOSIS — K219 Gastro-esophageal reflux disease without esophagitis: Secondary | ICD-10-CM | POA: Insufficient documentation

## 2015-10-21 DIAGNOSIS — O99019 Anemia complicating pregnancy, unspecified trimester: Secondary | ICD-10-CM

## 2015-10-21 DIAGNOSIS — O0990 Supervision of high risk pregnancy, unspecified, unspecified trimester: Secondary | ICD-10-CM

## 2015-10-21 LAB — COMPREHENSIVE METABOLIC PANEL
ALK PHOS: 126 U/L (ref 38–126)
ALT: 47 U/L (ref 14–54)
ANION GAP: 6 (ref 5–15)
AST: 36 U/L (ref 15–41)
Albumin: 3 g/dL — ABNORMAL LOW (ref 3.5–5.0)
BUN: <5 mg/dL — ABNORMAL LOW (ref 6–20)
CALCIUM: 8.5 mg/dL — AB (ref 8.9–10.3)
CO2: 22 mmol/L (ref 22–32)
Chloride: 108 mmol/L (ref 101–111)
Creatinine, Ser: 0.38 mg/dL — ABNORMAL LOW (ref 0.44–1.00)
Glucose, Bld: 80 mg/dL (ref 65–99)
Potassium: 3.5 mmol/L (ref 3.5–5.1)
SODIUM: 136 mmol/L (ref 135–145)
Total Bilirubin: 2.7 mg/dL — ABNORMAL HIGH (ref 0.3–1.2)
Total Protein: 6.6 g/dL (ref 6.5–8.1)

## 2015-10-21 LAB — RETICULOCYTES
RBC.: 3.02 MIL/uL — ABNORMAL LOW (ref 3.87–5.11)
RETIC COUNT ABSOLUTE: 480.2 10*3/uL — AB (ref 19.0–186.0)
Retic Ct Pct: 15.9 % — ABNORMAL HIGH (ref 0.4–3.1)

## 2015-10-21 LAB — CBC WITH DIFFERENTIAL/PLATELET
BASOS ABS: 0 10*3/uL (ref 0.0–0.1)
BASOS PCT: 0 %
EOS PCT: 1 %
Eosinophils Absolute: 0.1 10*3/uL (ref 0.0–0.7)
HEMATOCRIT: 26.1 % — AB (ref 36.0–46.0)
HEMOGLOBIN: 8.9 g/dL — AB (ref 12.0–15.0)
LYMPHS PCT: 16 %
Lymphs Abs: 2 10*3/uL (ref 0.7–4.0)
MCH: 29.5 pg (ref 26.0–34.0)
MCHC: 34.1 g/dL (ref 30.0–36.0)
MCV: 86.4 fL (ref 78.0–100.0)
MONOS PCT: 6 %
Monocytes Absolute: 0.8 10*3/uL (ref 0.1–1.0)
NEUTROS PCT: 77 %
Neutro Abs: 9.8 10*3/uL — ABNORMAL HIGH (ref 1.7–7.7)
Other: 0 %
PLATELETS: 356 10*3/uL (ref 150–400)
RBC: 3.02 MIL/uL — ABNORMAL LOW (ref 3.87–5.11)
RDW: 18.6 % — ABNORMAL HIGH (ref 11.5–15.5)
WBC: 12.7 10*3/uL — ABNORMAL HIGH (ref 4.0–10.5)

## 2015-10-21 LAB — URINALYSIS, ROUTINE W REFLEX MICROSCOPIC
BILIRUBIN URINE: NEGATIVE
Glucose, UA: NEGATIVE mg/dL
Ketones, ur: 15 mg/dL — AB
NITRITE: NEGATIVE
PH: 7 (ref 5.0–8.0)
Protein, ur: NEGATIVE mg/dL
Specific Gravity, Urine: 1.01 (ref 1.005–1.030)

## 2015-10-21 LAB — URINE MICROSCOPIC-ADD ON

## 2015-10-21 MED ORDER — HYDROMORPHONE HCL 2 MG PO TABS
2.0000 mg | ORAL_TABLET | Freq: Once | ORAL | Status: AC
Start: 2015-10-21 — End: 2015-10-21
  Administered 2015-10-21: 2 mg via ORAL
  Filled 2015-10-21: qty 1

## 2015-10-21 MED ORDER — SODIUM CHLORIDE 0.9% FLUSH
10.0000 mL | INTRAVENOUS | Status: DC | PRN
Start: 2015-10-21 — End: 2015-10-22
  Administered 2015-10-21: 10 mL
  Filled 2015-10-21 (×5): qty 10

## 2015-10-21 MED ORDER — HYDROMORPHONE HCL 1 MG/ML IJ SOLN
2.0000 mg | Freq: Once | INTRAMUSCULAR | Status: AC
  Administered 2015-10-21: 2 mg via INTRAVENOUS
  Filled 2015-10-21: qty 2

## 2015-10-21 MED ORDER — HEPARIN SOD (PORK) LOCK FLUSH 100 UNIT/ML IV SOLN
250.0000 [IU] | INTRAVENOUS | Status: AC | PRN
  Administered 2015-10-21: 250 [IU]
  Filled 2015-10-21: qty 3

## 2015-10-21 MED ORDER — DIPHENHYDRAMINE HCL 25 MG PO CAPS
25.0000 mg | ORAL_CAPSULE | Freq: Once | ORAL | Status: AC
  Administered 2015-10-21: 25 mg via ORAL
  Filled 2015-10-21: qty 1

## 2015-10-21 MED ORDER — SODIUM CHLORIDE 0.45 % IV SOLN
INTRAVENOUS | Status: DC
  Administered 2015-10-21: 500 mL/h via INTRAVENOUS

## 2015-10-21 MED ORDER — SODIUM CHLORIDE 0.45 % IV SOLN
INTRAVENOUS | Status: DC
  Administered 2015-10-21: 999 mL/h via INTRAVENOUS

## 2015-10-21 NOTE — MAU Provider Note (Signed)
MAU HISTORY AND PHYSICAL  Chief Complaint:  Arm Pain and Contractions   [name] is a 25 y.o.  G3P1011 with IUP at [redacted]w[redacted]d presenting for Arm Pain and Contractions Pt states that since 1:30PM has had intense left elbow pain that is not improved with her home oxycodone 15mg  she takes every 4 hours (last dose at around [facility]).  This was further compounded with her starting to have lower abdominal contractions like pain.  She also reports having difficulty keeping PO fluids and feels like she is "dehydrated".  Patient states she has been having  regular, every 5 minutes contractions, none vaginal bleeding, intact membranes, with active fetal movement.   Denies any LF, VB, urinary symptoms, HA, CP, SOB, RUQ pain, BLE swelling  She was recently discharged home from [facility] from crisis pain; there she received a partial exchanged transfusion.   Past Medical History  Diagnosis Date  . Miscarriage 03/22/2011    Pt reports 2 miscarriages.  . Depression 01/06/2011  . GERD (gastroesophageal reflux disease) 02/17/2011  . Trichotillomania     h/o  . Blood transfusion     "lots"  . Sickle cell anemia with crisis ([place])   . Exertional dyspnea     "sometimes"  . Migraines 11/08/11    "@ least twice/month"  . Chronic back pain     "very severe; have knot in my back; from tight muscle; take RX and exercise for it"  . Mood swings ([place]) 11/08/11    "I go back and forth; real bad"  . Sickle cell anemia ([name])   . Blood transfusion without reported diagnosis   . Genital HSV     Past Surgical History  Procedure Laterality Date  . Cholecystectomy  05/2010  . Dilation and curettage of uterus  02/20/11    S/P miscarriage    Family History  Problem Relation Age of Onset  . Diabetes Maternal Grandmother   . Diabetes Paternal Grandmother   . Hypertension Paternal Grandmother   . Diabetes Maternal Grandfather   . Sickle cell trait Mother   . Sickle cell trait Father     Social History   Substance Use Topics  . Smoking status: Former Smoker -- 0.25 packs/day for 1 years    Types: Cigarettes    Quit date: 03/25/2013  . Smokeless tobacco: Never Used  . Alcohol Use: No     Comment: pt states she quit marijuan in May 2013. Rare ETOH, + cigarettes.  She is enrolled in school    Allergies  Allergen Reactions  . Carrot [Daucus Carota] Hives, Swelling and Rash    Dietary:  Please do not send any form of carrot to patient  . Carrot Oil Hives and Swelling  . Latex Rash    *powdered Latex*     Prescriptions prior to admission  Medication Sig Dispense Refill Last Dose  . acetaminophen (TYLENOL) 325 MG tablet Take 650 mg by mouth every 6 (six) hours as needed for moderate pain.    Past Month at Unknown time  . calcium carbonate (TUMS - DOSED IN MG ELEMENTAL CALCIUM) 500 MG chewable tablet Chew 2 tablets by mouth 2 (two) times daily as needed for indigestion or heartburn.   Past Week at Unknown time  . folic acid (FOLVITE) 1 MG tablet Take 1 mg by mouth daily.   10/21/2015 at Unknown time  . methadone (DOLOPHINE) 5 MG tablet Take 1 tablet (5 mg total) by mouth daily. 30 tablet 0 10/20/2015 at Unknown  time  . [START ON 10/22/2015] oxyCODONE (ROXICODONE) 15 MG immediate release tablet Take 1 tablet (15 mg total) by mouth every 4 (four) hours as needed for pain. 90 tablet 0 10/21/2015 at 1200  . Prenatal Vit-Fe Fumarate-FA (PRENATAL MULTIVITAMIN) TABS tablet Take 1 tablet by mouth daily at 12 noon.   10/21/2015 at Unknown time  . valACYclovir (VALTREX) 500 MG tablet Take 1 tablet (500 mg total) by mouth 2 (two) times daily. 60 tablet 0 10/20/2015 at Unknown time    Review of Systems - Negative except for what is mentioned in HPI.  Physical Exam  Blood pressure 101/78, pulse 102, temperature 98.6 F (37 C), temperature source Oral, resp. rate 18, last menstrual period 02/09/2015, not currently breastfeeding. GENERAL: Well-developed, well-nourished female in no acute distress.  LUNGS: Clear to  auscultation bilaterally.  HEART: Regular rate and rhythm. ABDOMEN: Soft, nontender, nondistended, gravid.  EXTREMITIES: Nontender, no edema, 2+ distal pulses. Cervical Exam: Closed/thick/posterior Presentation: cephalic FHT:  Baseline [id number], +acels, no decels-reactive Contractions: q4 minutes   Labs: Results for orders placed or performed during the hospital encounter of 10/21/15 (from the past 24 hour(s))  CBC with Differential/Platelet   Collection Time: 10/21/15  6:20 PM  Result Value Ref Range   WBC 12.7 (H) 4.0 - 10.5 K/uL   RBC 3.02 (L) 3.87 - 5.11 MIL/uL   Hemoglobin 8.9 (L) 12.0 - 15.0 g/dL   HCT 26.1 (L) 36.0 - 46.0 %   MCV 86.4 78.0 - 100.0 fL   MCH 29.5 26.0 - 34.0 pg   MCHC 34.1 30.0 - 36.0 g/dL   RDW 18.6 (H) 11.5 - 15.5 %   Platelets 356 150 - 400 K/uL   Neutrophils Relative % 77 %   Lymphocytes Relative 16 %   Monocytes Relative 6 %   Eosinophils Relative 1 %   Basophils Relative 0 %   Other 0 %   Neutro Abs 9.8 (H) 1.7 - 7.7 K/uL   Lymphs Abs 2.0 0.7 - 4.0 K/uL   Monocytes Absolute 0.8 0.1 - 1.0 K/uL   Eosinophils Absolute 0.1 0.0 - 0.7 K/uL   Basophils Absolute 0.0 0.0 - 0.1 K/uL   RBC Morphology POLYCHROMASIA PRESENT   Reticulocytes   Collection Time: 10/21/15  6:20 PM  Result Value Ref Range   Retic Ct Pct 15.9 (H) 0.4 - 3.1 %   RBC. 3.02 (L) 3.87 - 5.11 MIL/uL   Retic Count, Manual 480.2 (H) 19.0 - 186.0 K/uL  Comprehensive metabolic panel   Collection Time: 10/21/15  6:20 PM  Result Value Ref Range   Sodium 136 135 - 145 mmol/L   Potassium 3.5 3.5 - 5.1 mmol/L   Chloride 108 101 - 111 mmol/L   CO2 22 22 - 32 mmol/L   Glucose, Bld 80 65 - 99 mg/dL   BUN <5 (L) 6 - 20 mg/dL   Creatinine, Ser 0.38 (L) 0.44 - 1.00 mg/dL   Calcium 8.5 (L) 8.9 - 10.3 mg/dL   Total Protein 6.6 6.5 - 8.1 g/dL   Albumin 3.0 (L) 3.5 - 5.0 g/dL   AST 36 15 - 41 U/L   ALT 47 14 - 54 U/L   Alkaline Phosphatase 126 38 - 126 U/L   Total Bilirubin 2.7 (H) 0.3 - 1.2 mg/dL    GFR calc non Af Amer >60 >60 mL/min   GFR calc Af Amer >60 >60 mL/min   Anion gap 6 5 - 15  Urinalysis, Routine w reflex microscopic (not at  [place])   Collection Time: 10/21/15  7:52 PM  Result Value Ref Range   Color, Urine YELLOW YELLOW   APPearance CLEAR CLEAR   Specific Gravity, Urine 1.010 1.005 - 1.030   pH 7.0 5.0 - 8.0   Glucose, UA NEGATIVE NEGATIVE mg/dL   Hgb urine dipstick TRACE (A) NEGATIVE   Bilirubin Urine NEGATIVE NEGATIVE   Ketones, ur 15 (A) NEGATIVE mg/dL   Protein, ur NEGATIVE NEGATIVE mg/dL   Nitrite NEGATIVE NEGATIVE   Leukocytes, UA TRACE (A) NEGATIVE  Urine microscopic-add on   Collection Time: 10/21/15  7:52 PM  Result Value Ref Range   Squamous Epithelial / LPF 0-5 (A) NONE SEEN   WBC, UA 0-5 0 - 5 WBC/hpf   RBC / HPF 0-5 0 - 5 RBC/hpf   Bacteria, UA RARE (A) NONE SEEN    Imaging Studies:  Ir Fluoro Guide Cv Line Right  10/16/2015  CLINICAL DATA:  Sickle cell anemia with crisis during pregnancy. Poor IV access and need for PICC line. EXAM: POWER PICC LINE PLACEMENT WITH ULTRASOUND AND FLUOROSCOPIC GUIDANCE FLUOROSCOPY TIME:  54 seconds. PROCEDURE: The patient was advised of the possible risks and complications and agreed to undergo the procedure. The patient was then brought to the angiographic suite for the procedure. The right arm was prepped with chlorhexidine, draped in the usual sterile fashion using maximum barrier technique (cap and mask, sterile gown, sterile gloves, large sterile sheet, hand hygiene and cutaneous antisepsis) and infiltrated locally with 1% Lidocaine. Ultrasound demonstrated patency of the right brachial vein, and this was documented with an image. Under real-time ultrasound guidance, this vein was accessed with a 21 gauge micropuncture needle and image documentation was performed. A 0.018 wire was introduced in to the vein. Over this, a 5.0 [place] dual lumen power injectable PICC was advanced to the lower SVC/right atrial junction.  Fluoroscopy during the procedure and fluoro spot radiograph confirms appropriate catheter position. The catheter was flushed and covered with a sterile dressing. Catheter length: 33 cm COMPLICATIONS: None IMPRESSION: Successful right arm power injectable PICC line placement with ultrasound and fluoroscopic guidance. The catheter is ready for use. Electronically Signed   By: Aletta Edouard M.D.   On: 10/16/2015 15:43   Ir Fluoro Guide Cv Line Right  09/25/2015  INDICATION: Sickle-cell crisis, [redacted] weeks pregnant, poor venous access. Request made for central venous access for medications /fluids. EXAM: RIGHT UPPER EXTREMITY PICC LINE PLACEMENT WITH ULTRASOUND AND FLUOROSCOPIC GUIDANCE MEDICATIONS: 1% lidocaine to skin and subcutaneous tissue ANESTHESIA/SEDATION: None FLUOROSCOPY TIME:  Fluoroscopy Time:  24 seconds COMPLICATIONS: None immediate. PROCEDURE: The patient was advised of the possible risks and complications and agreed to undergo the procedure. The patient was then brought to the angiographic suite for the procedure. The right arm was prepped with chlorhexidine, draped in the usual sterile fashion using maximum barrier technique (cap and mask, sterile gown, sterile gloves, large sterile sheet, hand hygiene and cutaneous antisepsis) and infiltrated locally with 1% Lidocaine. Ultrasound demonstrated patency of the right basilic vein, and this was documented with an image. Under real-time ultrasound guidance, this vein was accessed with a 21 gauge micropuncture needle and image documentation was performed. A 0.018 wire was introduced in to the vein. Over this, a 5 [place] double lumen power injectable PICC was advanced to the lower SVC/right atrial junction. Fluoroscopy during the procedure and fluoro spot radiograph confirms appropriate catheter position. The catheter was flushed and covered with a sterile dressing. Catheter length: 35 cm IMPRESSION: Successful  right arm Power PICC line placement with  ultrasound and fluoroscopic guidance. The catheter is ready for use. Read by: Rowe Robert, PA-C Electronically Signed   By: Aletta Edouard M.D.   On: 09/25/2015 17:05   Ir US Guide Vasc Access Right  10/16/2015  CLINICAL DATA:  Sickle cell anemia with crisis during pregnancy. Poor IV access and need for PICC line. EXAM: POWER PICC LINE PLACEMENT WITH ULTRASOUND AND FLUOROSCOPIC GUIDANCE FLUOROSCOPY TIME:  54 seconds. PROCEDURE: The patient was advised of the possible risks and complications and agreed to undergo the procedure. The patient was then brought to the angiographic suite for the procedure. The right arm was prepped with chlorhexidine, draped in the usual sterile fashion using maximum barrier technique (cap and mask, sterile gown, sterile gloves, large sterile sheet, hand hygiene and cutaneous antisepsis) and infiltrated locally with 1% Lidocaine. Ultrasound demonstrated patency of the right brachial vein, and this was documented with an image. Under real-time ultrasound guidance, this vein was accessed with a 21 gauge micropuncture needle and image documentation was performed. A 0.018 wire was introduced in to the vein. Over this, a 5.0 [place] dual lumen power injectable PICC was advanced to the lower SVC/right atrial junction. Fluoroscopy during the procedure and fluoro spot radiograph confirms appropriate catheter position. The catheter was flushed and covered with a sterile dressing. Catheter length: 33 cm COMPLICATIONS: None IMPRESSION: Successful right arm power injectable PICC line placement with ultrasound and fluoroscopic guidance. The catheter is ready for use. Electronically Signed   By: Aletta Edouard M.D.   On: 10/16/2015 15:43   Ir US Guide Vasc Access Right  09/25/2015  INDICATION: Sickle-cell crisis, [redacted] weeks pregnant, poor venous access. Request made for central venous access for medications /fluids. EXAM: RIGHT UPPER EXTREMITY PICC LINE PLACEMENT WITH ULTRASOUND AND FLUOROSCOPIC  GUIDANCE MEDICATIONS: 1% lidocaine to skin and subcutaneous tissue ANESTHESIA/SEDATION: None FLUOROSCOPY TIME:  Fluoroscopy Time:  24 seconds COMPLICATIONS: None immediate. PROCEDURE: The patient was advised of the possible risks and complications and agreed to undergo the procedure. The patient was then brought to the angiographic suite for the procedure. The right arm was prepped with chlorhexidine, draped in the usual sterile fashion using maximum barrier technique (cap and mask, sterile gown, sterile gloves, large sterile sheet, hand hygiene and cutaneous antisepsis) and infiltrated locally with 1% Lidocaine. Ultrasound demonstrated patency of the right basilic vein, and this was documented with an image. Under real-time ultrasound guidance, this vein was accessed with a 21 gauge micropuncture needle and image documentation was performed. A 0.018 wire was introduced in to the vein. Over this, a 5 [place] double lumen power injectable PICC was advanced to the lower SVC/right atrial junction. Fluoroscopy during the procedure and fluoro spot radiograph confirms appropriate catheter position. The catheter was flushed and covered with a sterile dressing. Catheter length: 35 cm IMPRESSION: Successful right arm Power PICC line placement with ultrasound and fluoroscopic guidance. The catheter is ready for use. Read by: Rowe Robert, PA-C Electronically Signed   By: Aletta Edouard M.D.   On: 09/25/2015 17:05   [place] Mfm Fetal Bpp Wo Non Stress  10/20/2015  OBSTETRICAL ULTRASOUND: This exam was performed within a [place] [facility] Department. The OB US report was generated in the AS system, and faxed to the ordering physician.  This report is available in the [facility]. See the AS Obstetric US report via the Image Link.  [place] Mfm Fetal Bpp Wo Non Stress  10/13/2015  OBSTETRICAL ULTRASOUND: This exam  was performed within a [place] Ultrasound Department. The OB US report was generated in the AS system, and faxed  to the ordering physician.  This report is available in the [facility]. See the AS Obstetric US report via the Image Link.  [place] Mfm Fetal Bpp Wo Non Stress  10/06/2015  OBSTETRICAL ULTRASOUND: This exam was performed within a [place] Ultrasound Department. The OB US report was generated in the AS system, and faxed to the ordering physician.  This report is available in the [facility]. See the AS Obstetric US report via the Image Link.  [place] Mfm Fetal Bpp Wo Non Stress  09/30/2015  OBSTETRICAL ULTRASOUND: This exam was performed within a [place] Ultrasound Department. The OB US report was generated in the AS system, and faxed to the ordering physician.  This report is available in the [facility]. See the AS Obstetric US report via the Image Link.  [place] Mfm Fetal Bpp Wo Non Stress  09/22/2015  OBSTETRICAL ULTRASOUND: This exam was performed within a [place] Ultrasound Department. The OB US report was generated in the AS system, and faxed to the ordering physician.  This report is available in the [facility]. See the AS Obstetric US report via the Image Link.  [place] Mfm Ob Follow Up  10/13/2015  OBSTETRICAL ULTRASOUND: This exam was performed within a [place] Ultrasound Department. The OB US report was generated in the AS system, and faxed to the ordering physician.  This report is available in the [facility]. See the AS Obstetric US report via the Image Link.  [place] Mfm Ob Follow Up  09/22/2015  OBSTETRICAL ULTRASOUND: This exam was performed within a [place] Ultrasound Department. The OB US report was generated in the AS system, and faxed to the ordering physician.  This report is available in the [facility]. See the AS Obstetric US report via the Image Link.  MDM:  Urine shows mild dehydration Patient states she is interested in going home without admission. Requesting IV fluids and IV pain medication.  Discussed patient with Dr. Glo Herring; plan to hydrated with 2 L of 0.45 NS and  given 2 mg of IV dilaudid and reevaluate the patients pain.  Cervix Rechecked after one hour and is unchanged from previous exam; like braxton hicks contractions/pelvic pressure.  Report given to [place] [address] who resumes care of the patient. Patient receiving IV fluids at this time.     Assessment: #Sickle cell #Chronic pain syndrome, opiate dependent   Plan: #Sickle cell #Chronic pain syndrome, opiate dependent   Melany Guernsey 6/7/20175:20 PM   Lezlie Lye, NP 10/21/2015 8:52 PM    OB Resident attestation:  I have seen and examined this patient; I agree with above documentation in the residents note.   [name] is a 25 y.o. G3P1011 reporting crisis pain and pelvic pressure. Here with concerns of possible labor. She is also requesting IV fluids and pain medication.  +FM, denies LOF, VB, vaginal discharge.  PE: BP 101/78 mmHg  Pulse 102  Temp(Src) 98.6 F (37 C) (Oral)  Resp 18  LMP 02/09/2015 Gen: calm comfortable, NAD Resp: normal effort, no distress Abd: gravid  ROS, labs, PMH reviewed NST reactive, Cat 1 tracing.   Plan: Report given to [place] [address] who resumes care of the patient. Patient receiving IV fluids at this time.   RN spoke with Dr. Glo Herring after IV fluids. Patient was given a second dose  of 2mg  dilaudid.   Patient was ok for DC home.   Lezlie Lye, NP 10/21/2015 8:54 PM

## 2015-10-21 NOTE — Discharge Instructions (Signed)
Braxton Hicks Contractions Contractions of the uterus can occur throughout pregnancy. Contractions are not always a sign that you are in labor.  WHAT ARE BRAXTON HICKS CONTRACTIONS?  Contractions that occur before labor are called Braxton Hicks contractions, or false labor. Toward the end of pregnancy (32-34 weeks), these contractions can develop more often and may become more forceful. This is not true labor because these contractions do not result in opening (dilatation) and thinning of the cervix. They are sometimes difficult to tell apart from true labor because these contractions can be forceful and people have different pain tolerances. You should not feel embarrassed if you go to the hospital with false labor. Sometimes, the only way to tell if you are in true labor is for your health care provider to look for changes in the cervix. If there are no prenatal problems or other health problems associated with the pregnancy, it is completely safe to be sent home with false labor and await the onset of true labor. HOW CAN YOU TELL THE DIFFERENCE BETWEEN TRUE AND FALSE LABOR? False Labor  The contractions of false labor are usually shorter and not as hard as those of true labor.   The contractions are usually irregular.   The contractions are often felt in the front of the lower abdomen and in the groin.   The contractions may go away when you walk around or change positions while lying down.   The contractions get weaker and are shorter lasting as time goes on.   The contractions do not usually become progressively stronger, regular, and closer together as with true labor.  True Labor 1. Contractions in true labor last 30-70 seconds, become very regular, usually become more intense, and increase in frequency.  2. The contractions do not go away with walking.  3. The discomfort is usually felt in the top of the uterus and spreads to the lower abdomen and low back.  4. True labor can  be determined by your health care provider with an exam. This will show that the cervix is dilating and getting thinner.  WHAT TO REMEMBER  Keep up with your usual exercises and follow other instructions given by your health care provider.   Take medicines as directed by your health care provider.   Keep your regular prenatal appointments.   Eat and drink lightly if you think you are going into labor.   If Braxton Hicks contractions are making you uncomfortable:   Change your position from lying down or resting to walking, or from walking to resting.   Sit and rest in a tub of warm water.   Drink 2-3 glasses of water. Dehydration may cause these contractions.   Do slow and deep breathing several times an hour.  WHEN SHOULD I SEEK IMMEDIATE MEDICAL CARE? Seek immediate medical care if:  Your contractions become stronger, more regular, and closer together.   You have fluid leaking or gushing from your vagina.   You have a fever.   You pass blood-tinged mucus.   You have vaginal bleeding.   You have continuous abdominal pain.   You have low back pain that you never had before.   You feel your baby's head pushing down and causing pelvic pressure.   Your baby is not moving as much as it used to.    This information is not intended to replace advice given to you by your health care provider. Make sure you discuss any questions you have with your health care  provider.   Document Released: 05/02/2005 Document Revised: 05/07/2013 Document Reviewed: 02/11/2013 Elsevier Interactive Patient Education 2016 [place].  Fetal Movement Counts Patient Name: __________________________________________________ Patient Due Date: ____________________ Performing a fetal movement count is highly recommended in high-risk pregnancies, but it is good for every pregnant woman to do. Your health care provider may ask you to start counting fetal movements at 28 weeks of the  pregnancy. Fetal movements often increase:  After eating a full meal.  After physical activity.  After eating or drinking something sweet or cold.  At rest. Pay attention to when you feel the baby is most active. This will help you notice a pattern of your baby's sleep and wake cycles and what factors contribute to an increase in fetal movement. It is important to perform a fetal movement count at the same time each day when your baby is normally most active.  HOW TO COUNT FETAL MOVEMENTS 5. Find a quiet and comfortable area to sit or lie down on your left side. Lying on your left side provides the best blood and oxygen circulation to your baby. 6. Write down the day and time on a sheet of paper or in a journal. 7. Start counting kicks, flutters, swishes, rolls, or jabs in a 2-hour period. You should feel at least 10 movements within 2 hours. 8. If you do not feel 10 movements in 2 hours, wait 2-3 hours and count again. Look for a change in the pattern or not enough counts in 2 hours. SEEK MEDICAL CARE IF:  You feel less than 10 counts in 2 hours, tried twice.  There is no movement in over an hour.  The pattern is changing or taking longer each day to reach 10 counts in 2 hours.  You feel the baby is not moving as he or she usually does. Date: ____________ Movements: ____________ Start time: ____________ [name] time: ____________  Date: ____________ Movements: ____________ Start time: ____________ [name] time: ____________ Date: ____________ Movements: ____________ Start time: ____________ [name] time: ____________ Date: ____________ Movements: ____________ Start time: ____________ [name] time: ____________ Date: ____________ Movements: ____________ Start time: ____________ [name] time: ____________ Date: ____________ Movements: ____________ Start time: ____________ [name] time: ____________ Date: ____________ Movements: ____________ Start time: ____________ [name] time:  ____________ Date: ____________ Movements: ____________ Start time: ____________ [name] time: ____________  Date: ____________ Movements: ____________ Start time: ____________ [name] time: ____________ Date: ____________ Movements: ____________ Start time: ____________ [name] time: ____________ Date: ____________ Movements: ____________ Start time: ____________ [name] time: ____________ Date: ____________ Movements: ____________ Start time: ____________ [name] time: ____________ Date: ____________ Movements: ____________ Start time: ____________ [name] time: ____________ Date: ____________ Movements: ____________ Start time: ____________ [name] time: ____________ Date: ____________ Movements: ____________ Start time: ____________ [name] time: ____________  Date: ____________ Movements: ____________ Start time: ____________ [name] time: ____________ Date: ____________ Movements: ____________ Start time: ____________ [name] time: ____________ Date: ____________ Movements: ____________ Start time: ____________ [name] time: ____________ Date: ____________ Movements: ____________ Start time: ____________ [name] time: ____________ Date: ____________ Movements: ____________ Start time: ____________ [name] time: ____________ Date: ____________ Movements: ____________ Start time: ____________ [name] time: ____________ Date: ____________ Movements: ____________ Start time: ____________ [name] time: ____________  Date: ____________ Movements: ____________ Start time: ____________ [name] time: ____________ Date: ____________ Movements: ____________ Start time: ____________ [name] time: ____________ Date: ____________ Movements: ____________ Start time: ____________ [name] time: ____________ Date: ____________ Movements: ____________ Start time: ____________ [name] time: ____________ Date: ____________ Movements: ____________ Start time: ____________ [name] time: ____________ Date: ____________ Movements:  ____________ Start time: ____________ [name]  time: ____________ Date: ____________ Movements: ____________ Start time: ____________ [name] time: ____________  Date: ____________ Movements: ____________ Start time: ____________ [name] time: ____________ Date: ____________ Movements: ____________ Start time: ____________ [name] time: ____________ Date: ____________ Movements: ____________ Start time: ____________ [name] time: ____________ Date: ____________ Movements: ____________ Start time: ____________ [name] time: ____________ Date: ____________ Movements: ____________ Start time: ____________ [name] time: ____________ Date: ____________ Movements: ____________ Start time: ____________ [name] time: ____________ Date: ____________ Movements: ____________ Start time: ____________ [name] time: ____________  Date: ____________ Movements: ____________ Start time: ____________ [name] time: ____________ Date: ____________ Movements: ____________ Start time: ____________ [name] time: ____________ Date: ____________ Movements: ____________ Start time: ____________ [name] time: ____________ Date: ____________ Movements: ____________ Start time: ____________ [name] time: ____________ Date: ____________ Movements: ____________ Start time: ____________ [name] time: ____________ Date: ____________ Movements: ____________ Start time: ____________ [name] time: ____________ Date: ____________ Movements: ____________ Start time: ____________ [name] time: ____________  Date: ____________ Movements: ____________ Start time: ____________ [name] time: ____________ Date: ____________ Movements: ____________ Start time: ____________ [name] time: ____________ Date: ____________ Movements: ____________ Start time: ____________ [name] time: ____________ Date: ____________ Movements: ____________ Start time: ____________ [name] time: ____________ Date: ____________ Movements: ____________ Start time: ____________ [name]  time: ____________ Date: ____________ Movements: ____________ Start time: ____________ [name] time: ____________ Date: ____________ Movements: ____________ Start time: ____________ [name] time: ____________  Date: ____________ Movements: ____________ Start time: ____________ [name] time: ____________ Date: ____________ Movements: ____________ Start time: ____________ [name] time: ____________ Date: ____________ Movements: ____________ Start time: ____________ [name] time: ____________ Date: ____________ Movements: ____________ Start time: ____________ [name] time: ____________ Date: ____________ Movements: ____________ Start time: ____________ [name] time: ____________ Date: ____________ Movements: ____________ Start time: ____________ [name] time: ____________   This information is not intended to replace advice given to you by your health care provider. Make sure you discuss any questions you have with your health care provider.   Document Released: 06/01/2006 Document Revised: 05/23/2014 Document Reviewed: 02/27/2012 Elsevier Interactive Patient Education [employer].

## 2015-10-21 NOTE — MAU Note (Signed)
Pain started yesterday morning in left elbow.  Saw dr yesterday, pain at that time was mild,  Has gotten much stronger now.  Has been contracting, now are every 5 min.  Feeling a lot of pressure.

## 2015-10-21 NOTE — MAU Note (Signed)
Pt called out asking for pain medication. States she "wants one more dose" before she is discharged home. States pain is 8/10. CNM notified.

## 2015-10-24 ENCOUNTER — Telehealth: Payer: Self-pay | Admitting: Obstetrics and Gynecology

## 2015-10-26 ENCOUNTER — Inpatient Hospital Stay (HOSPITAL_COMMUNITY)
Admission: AD | Admit: 2015-10-26 | Discharge: 2015-11-02 | DRG: 774 | Disposition: A | Discharge status: Home / Self Care | Payer: Medicare Other | Source: Ambulatory Visit | Attending: Obstetrics and Gynecology | Admitting: Obstetrics and Gynecology

## 2015-10-26 ENCOUNTER — Encounter (HOSPITAL_COMMUNITY): Payer: Self-pay | Admitting: *Deleted

## 2015-10-26 ENCOUNTER — Encounter (HOSPITAL_COMMUNITY): Payer: Medicare Other

## 2015-10-26 ENCOUNTER — Ambulatory Visit (INDEPENDENT_AMBULATORY_CARE_PROVIDER_SITE_OTHER): Payer: Medicare Other | Admitting: Family Medicine

## 2015-10-26 ENCOUNTER — Encounter: Payer: Self-pay | Admitting: Family Medicine

## 2015-10-26 ENCOUNTER — Ambulatory Visit: Payer: Medicare Other | Admitting: Family Medicine

## 2015-10-26 VITALS — BP 118/54 | HR 103 | Wt 175.1 lb

## 2015-10-26 DIAGNOSIS — O0990 Supervision of high risk pregnancy, unspecified, unspecified trimester: Secondary | ICD-10-CM

## 2015-10-26 DIAGNOSIS — O98519 Other viral diseases complicating pregnancy, unspecified trimester: Secondary | ICD-10-CM

## 2015-10-26 DIAGNOSIS — F1120 Opioid dependence, uncomplicated: Secondary | ICD-10-CM

## 2015-10-26 DIAGNOSIS — Z87891 Personal history of nicotine dependence: Secondary | ICD-10-CM

## 2015-10-26 DIAGNOSIS — O99323 Drug use complicating pregnancy, third trimester: Secondary | ICD-10-CM

## 2015-10-26 DIAGNOSIS — O9989 Other specified diseases and conditions complicating pregnancy, childbirth and the puerperium: Secondary | ICD-10-CM | POA: Diagnosis present

## 2015-10-26 DIAGNOSIS — O9902 Anemia complicating childbirth: Principal | ICD-10-CM | POA: Diagnosis present

## 2015-10-26 DIAGNOSIS — B951 Streptococcus, group B, as the cause of diseases classified elsewhere: Secondary | ICD-10-CM

## 2015-10-26 DIAGNOSIS — F1910 Other psychoactive substance abuse, uncomplicated: Secondary | ICD-10-CM

## 2015-10-26 DIAGNOSIS — O99013 Anemia complicating pregnancy, third trimester: Secondary | ICD-10-CM | POA: Diagnosis not present

## 2015-10-26 DIAGNOSIS — O99324 Drug use complicating childbirth: Secondary | ICD-10-CM | POA: Diagnosis present

## 2015-10-26 DIAGNOSIS — O99019 Anemia complicating pregnancy, unspecified trimester: Secondary | ICD-10-CM

## 2015-10-26 DIAGNOSIS — O99320 Drug use complicating pregnancy, unspecified trimester: Secondary | ICD-10-CM

## 2015-10-26 DIAGNOSIS — O99824 Streptococcus B carrier state complicating childbirth: Secondary | ICD-10-CM | POA: Diagnosis present

## 2015-10-26 DIAGNOSIS — F633 Trichotillomania: Secondary | ICD-10-CM | POA: Diagnosis not present

## 2015-10-26 DIAGNOSIS — D571 Sickle-cell disease without crisis: Secondary | ICD-10-CM | POA: Diagnosis not present

## 2015-10-26 DIAGNOSIS — O99820 Streptococcus B carrier state complicating pregnancy: Secondary | ICD-10-CM | POA: Diagnosis not present

## 2015-10-26 DIAGNOSIS — Z3A37 37 weeks gestation of pregnancy: Secondary | ICD-10-CM

## 2015-10-26 DIAGNOSIS — F329 Major depressive disorder, single episode, unspecified: Secondary | ICD-10-CM

## 2015-10-26 DIAGNOSIS — O9912 Other diseases of the blood and blood-forming organs and certain disorders involving the immune mechanism complicating childbirth: Secondary | ICD-10-CM | POA: Diagnosis present

## 2015-10-26 DIAGNOSIS — Z3A38 38 weeks gestation of pregnancy: Secondary | ICD-10-CM

## 2015-10-26 DIAGNOSIS — O9832 Other infections with a predominantly sexual mode of transmission complicating childbirth: Secondary | ICD-10-CM | POA: Diagnosis present

## 2015-10-26 DIAGNOSIS — A6009 Herpesviral infection of other urogenital tract: Secondary | ICD-10-CM

## 2015-10-26 DIAGNOSIS — Z8249 Family history of ischemic heart disease and other diseases of the circulatory system: Secondary | ICD-10-CM

## 2015-10-26 DIAGNOSIS — B009 Herpesviral infection, unspecified: Secondary | ICD-10-CM | POA: Diagnosis present

## 2015-10-26 DIAGNOSIS — Z79899 Other long term (current) drug therapy: Secondary | ICD-10-CM | POA: Diagnosis not present

## 2015-10-26 DIAGNOSIS — O99344 Other mental disorders complicating childbirth: Secondary | ICD-10-CM | POA: Diagnosis present

## 2015-10-26 DIAGNOSIS — O288 Other abnormal findings on antenatal screening of mother: Secondary | ICD-10-CM

## 2015-10-26 DIAGNOSIS — F1490 Cocaine use, unspecified, uncomplicated: Secondary | ICD-10-CM | POA: Diagnosis present

## 2015-10-26 DIAGNOSIS — O9962 Diseases of the digestive system complicating childbirth: Secondary | ICD-10-CM | POA: Diagnosis present

## 2015-10-26 DIAGNOSIS — D5700 Hb-SS disease with crisis, unspecified: Secondary | ICD-10-CM | POA: Diagnosis present

## 2015-10-26 DIAGNOSIS — O98313 Other infections with a predominantly sexual mode of transmission complicating pregnancy, third trimester: Secondary | ICD-10-CM | POA: Diagnosis not present

## 2015-10-26 DIAGNOSIS — Z833 Family history of diabetes mellitus: Secondary | ICD-10-CM

## 2015-10-26 DIAGNOSIS — K219 Gastro-esophageal reflux disease without esophagitis: Secondary | ICD-10-CM | POA: Diagnosis present

## 2015-10-26 DIAGNOSIS — D57819 Other sickle-cell disorders with crisis, unspecified: Secondary | ICD-10-CM | POA: Diagnosis not present

## 2015-10-26 DIAGNOSIS — IMO0002 Reserved for concepts with insufficient information to code with codable children: Secondary | ICD-10-CM

## 2015-10-26 DIAGNOSIS — O99343 Other mental disorders complicating pregnancy, third trimester: Secondary | ICD-10-CM | POA: Diagnosis not present

## 2015-10-26 DIAGNOSIS — G8929 Other chronic pain: Secondary | ICD-10-CM | POA: Diagnosis present

## 2015-10-26 DIAGNOSIS — O98513 Other viral diseases complicating pregnancy, third trimester: Secondary | ICD-10-CM

## 2015-10-26 DIAGNOSIS — D638 Anemia in other chronic diseases classified elsewhere: Secondary | ICD-10-CM | POA: Diagnosis present

## 2015-10-26 DIAGNOSIS — D72829 Elevated white blood cell count, unspecified: Secondary | ICD-10-CM | POA: Diagnosis present

## 2015-10-26 DIAGNOSIS — F1920 Other psychoactive substance dependence, uncomplicated: Secondary | ICD-10-CM

## 2015-10-26 DIAGNOSIS — A6000 Herpesviral infection of urogenital system, unspecified: Secondary | ICD-10-CM | POA: Diagnosis present

## 2015-10-26 DIAGNOSIS — Z9049 Acquired absence of other specified parts of digestive tract: Secondary | ICD-10-CM

## 2015-10-26 LAB — CBC WITH DIFFERENTIAL/PLATELET
BASOS PCT: 0 %
Basophils Absolute: 0 10*3/uL (ref 0.0–0.1)
EOS ABS: 0.2 10*3/uL (ref 0.0–0.7)
EOS PCT: 1 %
HCT: 27.3 % — ABNORMAL LOW (ref 36.0–46.0)
HEMOGLOBIN: 9.6 g/dL — AB (ref 12.0–15.0)
LYMPHS ABS: 2.2 10*3/uL (ref 0.7–4.0)
Lymphocytes Relative: 13 %
MCH: 30.6 pg (ref 26.0–34.0)
MCHC: 35.2 g/dL (ref 30.0–36.0)
MCV: 86.9 fL (ref 78.0–100.0)
MONO ABS: 1.1 10*3/uL — AB (ref 0.1–1.0)
MONOS PCT: 6 %
Neutro Abs: 14 10*3/uL — ABNORMAL HIGH (ref 1.7–7.7)
Neutrophils Relative %: 80 %
Platelets: 459 10*3/uL — ABNORMAL HIGH (ref 150–400)
RBC: 3.14 MIL/uL — ABNORMAL LOW (ref 3.87–5.11)
RDW: 19.7 % — AB (ref 11.5–15.5)
WBC: 17.5 10*3/uL — ABNORMAL HIGH (ref 4.0–10.5)

## 2015-10-26 LAB — URINE MICROSCOPIC-ADD ON: RBC / HPF: NONE SEEN RBC/hpf (ref 0–5)

## 2015-10-26 LAB — URINALYSIS, ROUTINE W REFLEX MICROSCOPIC
GLUCOSE, UA: NEGATIVE mg/dL
HGB URINE DIPSTICK: NEGATIVE
KETONES UR: NEGATIVE mg/dL
NITRITE: NEGATIVE
PH: 5.5 (ref 5.0–8.0)
Protein, ur: NEGATIVE mg/dL
Specific Gravity, Urine: 1.01 (ref 1.005–1.030)

## 2015-10-26 LAB — RAPID URINE DRUG SCREEN, HOSP PERFORMED
Amphetamines: NOT DETECTED
BARBITURATES: NOT DETECTED
Benzodiazepines: NOT DETECTED
COCAINE: NOT DETECTED
Opiates: NOT DETECTED
TETRAHYDROCANNABINOL: NOT DETECTED

## 2015-10-26 LAB — POCT URINALYSIS DIP (DEVICE)
Bilirubin Urine: NEGATIVE
GLUCOSE, UA: NEGATIVE mg/dL
Hgb urine dipstick: NEGATIVE
Ketones, ur: NEGATIVE mg/dL
NITRITE: NEGATIVE
PROTEIN: NEGATIVE mg/dL
Specific Gravity, Urine: <=1.005 (ref 1.005–1.030)
UROBILINOGEN UA: 2 mg/dL — AB (ref 0.0–1.0)
pH: 5.5 (ref 5.0–8.0)

## 2015-10-26 LAB — RETICULOCYTES
RBC.: 3.14 MIL/uL — AB (ref 3.87–5.11)
RETIC CT PCT: 16.4 % — AB (ref 0.4–3.1)
Retic Count, Absolute: 515 10*3/uL — ABNORMAL HIGH (ref 19.0–186.0)

## 2015-10-26 MED ORDER — HYDROMORPHONE 1 MG/ML IV SOLN
INTRAVENOUS | Status: DC
  Administered 2015-10-26: 23:00:00 via INTRAVENOUS
  Administered 2015-10-27: 2.4 mg via INTRAVENOUS
  Administered 2015-10-27: 1.5 mg via INTRAVENOUS
  Filled 2015-10-26 (×2): qty 25

## 2015-10-26 MED ORDER — VALACYCLOVIR HCL 500 MG PO TABS
500.0000 mg | ORAL_TABLET | Freq: Two times a day (BID) | ORAL | Status: DC
  Administered 2015-10-26 – 2015-10-30 (×9): 500 mg via ORAL
  Filled 2015-10-26 (×12): qty 1

## 2015-10-26 MED ORDER — DOCUSATE SODIUM 100 MG PO CAPS
100.0000 mg | ORAL_CAPSULE | Freq: Every day | ORAL | Status: DC
  Administered 2015-10-27 – 2015-10-30 (×4): 100 mg via ORAL
  Filled 2015-10-26 (×4): qty 1

## 2015-10-26 MED ORDER — METHADONE HCL 5 MG PO TABS
5.0000 mg | ORAL_TABLET | Freq: Every day | ORAL | Status: DC
  Administered 2015-10-27 – 2015-11-02 (×7): 5 mg via ORAL
  Filled 2015-10-26 (×7): qty 1

## 2015-10-26 MED ORDER — CALCIUM CARBONATE ANTACID 500 MG PO CHEW: 2.0000 | CHEWABLE_TABLET | ORAL | Status: DC | PRN

## 2015-10-26 MED ORDER — ENOXAPARIN SODIUM 40 MG/0.4ML ~~LOC~~ SOLN
40.0000 mg | SUBCUTANEOUS | Status: DC
Start: 2015-10-26 — End: 2015-10-30
  Administered 2015-10-26 – 2015-10-29 (×4): 40 mg via SUBCUTANEOUS
  Filled 2015-10-26 (×4): qty 0.4

## 2015-10-26 MED ORDER — FOLIC ACID 1 MG PO TABS
1.0000 mg | ORAL_TABLET | Freq: Every day | ORAL | Status: DC
  Administered 2015-10-27 – 2015-10-29 (×3): 1 mg via ORAL
  Filled 2015-10-26 (×5): qty 1

## 2015-10-26 MED ORDER — HYDROMORPHONE HCL 2 MG/ML IJ SOLN
2.0000 mg | Freq: Once | INTRAMUSCULAR | Status: AC
  Administered 2015-10-26: 2 mg via INTRAVENOUS
  Filled 2015-10-26: qty 1

## 2015-10-26 MED ORDER — HYDROMORPHONE HCL 2 MG/ML IJ SOLN: 2.0000 mg | Freq: Once | INTRAMUSCULAR | Status: DC

## 2015-10-26 MED ORDER — ONDANSETRON HCL 4 MG/2ML IJ SOLN
4.0000 mg | Freq: Once | INTRAMUSCULAR | Status: AC
  Administered 2015-10-26: 4 mg via INTRAVENOUS
  Filled 2015-10-26: qty 2

## 2015-10-26 MED ORDER — KCL IN DEXTROSE-NACL 10-5-0.45 MEQ/L-%-% IV SOLN
INTRAVENOUS | Status: DC
  Administered 2015-10-26 – 2015-10-30 (×13): via INTRAVENOUS
  Filled 2015-10-26 (×17): qty 1000

## 2015-10-26 MED ORDER — VALACYCLOVIR HCL 500 MG PO TABS: 500.0000 mg | ORAL_TABLET | Freq: Two times a day (BID) | ORAL | Status: DC

## 2015-10-26 MED ORDER — LACTATED RINGERS IV BOLUS (SEPSIS)
1000.0000 mL | Freq: Once | INTRAVENOUS | Status: AC
  Administered 2015-10-26: 1000 mL via INTRAVENOUS

## 2015-10-26 MED ORDER — NALOXONE HCL 0.4 MG/ML IJ SOLN: 0.4000 mg | INTRAMUSCULAR | Status: DC | PRN

## 2015-10-26 MED ORDER — ACETAMINOPHEN 325 MG PO TABS: 650.0000 mg | ORAL_TABLET | ORAL | Status: DC | PRN

## 2015-10-26 MED ORDER — DIPHENHYDRAMINE HCL 12.5 MG/5ML PO ELIX: 12.5000 mg | ORAL_SOLUTION | Freq: Four times a day (QID) | ORAL | Status: DC | PRN

## 2015-10-26 MED ORDER — PRENATAL MULTIVITAMIN CH
1.0000 | ORAL_TABLET | Freq: Every day | ORAL | Status: DC
Start: 2015-10-27 — End: 2015-10-30
  Administered 2015-10-27 – 2015-10-30 (×4): 1 via ORAL
  Filled 2015-10-26 (×4): qty 1

## 2015-10-26 MED ORDER — DIPHENHYDRAMINE HCL 50 MG/ML IJ SOLN
12.5000 mg | Freq: Four times a day (QID) | INTRAMUSCULAR | Status: DC | PRN
  Administered 2015-10-26 – 2015-10-27 (×2): 12.5 mg via INTRAVENOUS
  Filled 2015-10-26 (×2): qty 1

## 2015-10-26 MED ORDER — ZOLPIDEM TARTRATE 5 MG PO TABS: 5.0000 mg | ORAL_TABLET | Freq: Every evening | ORAL | Status: DC | PRN

## 2015-10-26 MED ORDER — SODIUM CHLORIDE 0.9% FLUSH: 9.0000 mL | INTRAVENOUS | Status: DC | PRN

## 2015-10-26 MED ORDER — DIPHENHYDRAMINE HCL 25 MG PO CAPS
50.0000 mg | ORAL_CAPSULE | Freq: Once | ORAL | Status: AC
  Administered 2015-10-26: 50 mg via ORAL
  Filled 2015-10-26: qty 2

## 2015-10-26 MED ORDER — ONDANSETRON HCL 4 MG/2ML IJ SOLN: 4.0000 mg | Freq: Four times a day (QID) | INTRAMUSCULAR | Status: DC | PRN

## 2015-10-26 MED ORDER — HYDROMORPHONE HCL 1 MG/ML IJ SOLN: 1.0000 mg | Freq: Once | INTRAMUSCULAR | Status: DC

## 2015-10-26 NOTE — MAU Provider Note (Signed)
MAU HISTORY AND PHYSICAL  Chief Complaint:  Sickle Cell Pain Crisis   [name] is a 25 y.o.  G3P1011 with IUP at [redacted]w[redacted]d presenting for Sickle Cell Pain Crisis .Pt states that today had sudden onset of right shoulder pain that started in mid afternoon.  Denies any HA, changes in vision, numbness/tingling/weakness in extremity, abdominal pain, swelling, CP, SOB, VB. LOF, urinary symptoms.  Pt was seen in clinic today and states she had no symptoms.  Today presents stating "I feel like I need to be admitted".  Most recently d/c from [facility] ED on 6/4 for leg pain resolved with 6mg  of IV dilaudid.  Today is requesting similar therapy. Pt also endorses that she has attempted over the past few days to wean off her oxycodone and reports some anxiety, cold sweats and diarrhea.   Patient states she has been having  none contractions, none vaginal bleeding, intact membranes, with active fetal movement.    Past Medical History  Diagnosis Date  . Miscarriage 03/22/2011    Pt reports 2 miscarriages.  . Depression 01/06/2011  . GERD (gastroesophageal reflux disease) 02/17/2011  . Trichotillomania     h/o  . Blood transfusion     "lots"  . Sickle cell anemia with crisis ( [place])   . Exertional dyspnea     "sometimes"  . Migraines 11/08/11    "@ least twice/month"  . Chronic back pain     "very severe; have knot in my back; from tight muscle; take RX and exercise for it"  . Mood swings ([place]) 11/08/11    "I go back and forth; real bad"  . Sickle cell anemia ([name])   . Blood transfusion without reported diagnosis   . Genital HSV     Past Surgical History  Procedure Laterality Date  . Cholecystectomy  05/2010  . Dilation and curettage of uterus  02/20/11    S/P miscarriage    Family History  Problem Relation Age of Onset  . Diabetes Maternal Grandmother   . Diabetes Paternal Grandmother   . Hypertension Paternal Grandmother   . Diabetes Maternal Grandfather   . Sickle cell trait Mother    . Sickle cell trait Father     Social History  Substance Use Topics  . Smoking status: Former Smoker -- 0.25 packs/day for 1 years    Types: Cigarettes    Quit date: 03/25/2013  . Smokeless tobacco: Never Used  . Alcohol Use: No     Comment: pt states she quit marijuan in May 2013. Rare ETOH, + cigarettes.  She is enrolled in school    Allergies  Allergen Reactions  . Carrot [Daucus Carota] Hives, Swelling and Rash    Dietary:  Please do not send any form of carrot to patient  . Carrot Oil Hives and Swelling  . Latex Rash    *powdered Latex*     Prescriptions prior to admission  Medication Sig Dispense Refill Last Dose  . calcium carbonate (TUMS - DOSED IN MG ELEMENTAL CALCIUM) 500 MG chewable tablet Chew 2 tablets by mouth 2 (two) times daily as needed for indigestion or heartburn.   Past Month at Unknown time  . folic acid (FOLVITE) 1 MG tablet Take 1 mg by mouth daily.   10/25/2015 at Unknown time  . methadone (DOLOPHINE) 5 MG tablet Take 1 tablet (5 mg total) by mouth daily. 30 tablet 0 10/25/2015 at Unknown time  . oxyCODONE (ROXICODONE) 15 MG immediate release tablet Take 1 tablet (15  mg total) by mouth every 4 (four) hours as needed for pain. 90 tablet 0 10/26/2015 at 1100  . Prenatal Vit-Fe Fumarate-FA (PRENATAL MULTIVITAMIN) TABS tablet Take 1 tablet by mouth daily at 12 noon.   10/26/2015 at Unknown time  . valACYclovir (VALTREX) 500 MG tablet Take 1 tablet (500 mg total) by mouth 2 (two) times daily. 60 tablet 0 Past Week at Unknown time    Review of Systems - Negative except for what is mentioned in HPI.  Physical Exam  Blood pressure 116/60, pulse 106, temperature 98.4 F (36.9 C), temperature source Oral, resp. rate 18, last menstrual period 01/24/2015, SpO2 99 %, not currently breastfeeding. GENERAL: Well-developed, well-nourished female in no acute distress.  LUNGS: Clear to auscultation bilaterally.  HEART: Regular rate and rhythm. ABDOMEN: Soft, nontender,  nondistended, gravid.  EXTREMITIES: Nontender, no edema, 2+ distal pulses. Cervical Exam: Deferred Presentation: cephalic FHT:  Baseline [id number], mod variability, +acels, decel x1- Cat I Contractions: None   Labs: Results for orders placed or performed in visit on 10/26/15 (from the past 24 hour(s))  POCT urinalysis dip (device)   Collection Time: 10/26/15  8:17 AM  Result Value Ref Range   Glucose, UA NEGATIVE NEGATIVE mg/dL   Bilirubin Urine NEGATIVE NEGATIVE   Ketones, ur NEGATIVE NEGATIVE mg/dL   Specific Gravity, Urine <=1.005 1.005 - 1.030   Hgb urine dipstick NEGATIVE NEGATIVE   pH 5.5 5.0 - 8.0   Protein, ur NEGATIVE NEGATIVE mg/dL   Urobilinogen, UA 2.0 (H) 0.0 - 1.0 mg/dL   Nitrite NEGATIVE NEGATIVE   Leukocytes, UA TRACE (A) NEGATIVE   *Note: Due to a large number of results and/or encounters for the requested time period, some results have not been displayed. A complete set of results can be found in Results Review.    Imaging Studies:  Ir Fluoro Guide Cv Line Right  10/16/2015  CLINICAL DATA:  Sickle cell anemia with crisis during pregnancy. Poor IV access and need for PICC line. EXAM: POWER PICC LINE PLACEMENT WITH ULTRASOUND AND FLUOROSCOPIC GUIDANCE FLUOROSCOPY TIME:  54 seconds. PROCEDURE: The patient was advised of the possible risks and complications and agreed to undergo the procedure. The patient was then brought to the angiographic suite for the procedure. The right arm was prepped with chlorhexidine, draped in the usual sterile fashion using maximum barrier technique (cap and mask, sterile gown, sterile gloves, large sterile sheet, hand hygiene and cutaneous antisepsis) and infiltrated locally with 1% Lidocaine. Ultrasound demonstrated patency of the right brachial vein, and this was documented with an image. Under real-time ultrasound guidance, this vein was accessed with a 21 gauge micropuncture needle and image documentation was performed. A 0.018 wire was introduced  in to the vein. Over this, a 5.0 [place] dual lumen power injectable PICC was advanced to the lower SVC/right atrial junction. Fluoroscopy during the procedure and fluoro spot radiograph confirms appropriate catheter position. The catheter was flushed and covered with a sterile dressing. Catheter length: 33 cm COMPLICATIONS: None IMPRESSION: Successful right arm power injectable PICC line placement with ultrasound and fluoroscopic guidance. The catheter is ready for use. Electronically Signed   By: Aletta Edouard M.D.   On: 10/16/2015 15:43   Ir US Guide Vasc Access Right  10/16/2015  CLINICAL DATA:  Sickle cell anemia with crisis during pregnancy. Poor IV access and need for PICC line. EXAM: POWER PICC LINE PLACEMENT WITH ULTRASOUND AND FLUOROSCOPIC GUIDANCE FLUOROSCOPY TIME:  54 seconds. PROCEDURE: The patient was advised of the possible risks  and complications and agreed to undergo the procedure. The patient was then brought to the angiographic suite for the procedure. The right arm was prepped with chlorhexidine, draped in the usual sterile fashion using maximum barrier technique (cap and mask, sterile gown, sterile gloves, large sterile sheet, hand hygiene and cutaneous antisepsis) and infiltrated locally with 1% Lidocaine. Ultrasound demonstrated patency of the right brachial vein, and this was documented with an image. Under real-time ultrasound guidance, this vein was accessed with a 21 gauge micropuncture needle and image documentation was performed. A 0.018 wire was introduced in to the vein. Over this, a 5.0 [place] dual lumen power injectable PICC was advanced to the lower SVC/right atrial junction. Fluoroscopy during the procedure and fluoro spot radiograph confirms appropriate catheter position. The catheter was flushed and covered with a sterile dressing. Catheter length: 33 cm COMPLICATIONS: None IMPRESSION: Successful right arm power injectable PICC line placement with ultrasound and fluoroscopic  guidance. The catheter is ready for use. Electronically Signed   By: Aletta Edouard M.D.   On: 10/16/2015 15:43   [place] Mfm Fetal Bpp Wo Non Stress  10/20/2015  OBSTETRICAL ULTRASOUND: This exam was performed within a [facility] Department. The OB US report was generated in the AS system, and faxed to the ordering physician.  This report is available in the [facility]. See the AS Obstetric US report via the Image Link.  [place] Mfm Fetal Bpp Wo Non Stress  10/13/2015  OBSTETRICAL ULTRASOUND: This exam was performed within a [facility]ound Department. The OB US report was generated in the AS system, and faxed to the ordering physician.  This report is available in the [facility]. See the AS Obstetric US report via the Image Link.  [place] Mfm Fetal Bpp Wo Non Stress  10/06/2015  OBSTETRICAL ULTRASOUND: This exam was performed within a [facility]trasound Department. The OB US report was generated in the AS system, and faxed to the ordering physician.  This report is available in the [facility]. See the AS Obstetric US report via the Image Link.  [place] Mfm Fetal Bpp Wo Non Stress  09/30/2015  OBSTETRICAL ULTRASOUND: This exam was performed within a [facility]ound Department. The OB US report was generated in the AS system, and faxed to the ordering physician.  This report is available in the [facility]. See the AS Obstetric US report via the Image Link.  [place] Mfm Ob Follow Up  10/13/2015  OBSTETRICAL ULTRASOUND: This exam was performed within a  Ultrasound Department. The OB US report was generated in the AS system, and faxed to the ordering physician.  This report is available in the [facility]. See the AS Obstetric US report via the Image Link.   Assessment: [name] is  25 y.o. G3P1011 at [redacted]w[redacted]d presents with Sickle Cell Pain Crisis .  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  DOA was negative for opiates, consider diversion since pt has multiple ED visits for opiate need despite oxycodone 15mg  q4hr prescribed by Heme/Onc.    Melany Guernsey 6/12/20174:23 PM

## 2015-10-26 NOTE — Progress Notes (Signed)
IOL scheduled 06/16 @ 700am

## 2015-10-26 NOTE — Progress Notes (Signed)
I was paged by Dr.Wouk to consult over the phone on management of [place] that Ms.Nooner is having. I agree with Dr.Wouk that the management will be deferred to the primary team and SS service who will be available in the morning. I agree with the current management with IVF, pain control with PCA. Hb is stable and there seems to urgent indication of transfusion. Vitals are stable. Patient has no urinary or respiratory symptoms and saturation on RA is > 90%. UA did not suggest any infection.

## 2015-10-26 NOTE — H&P (Signed)
ANTEPARTUM ADMISSION HISTORY AND PHYSICAL NOTE   History of Present Illness: [name] is a 25 y.o. G3P1011 at [redacted]w[redacted]d presenting with right arm pain which she says is typical of sickle cell pain crisis. Received dilaudid and if fluids in mau and pain continues. No difficulty breathing or chest pain, no dysuria or fevers. No vaginal bleeding or lof. Positive fetal movements.   Patient Active Problem List   Diagnosis Date Noted  . Sickle cell pain crisis ([name]) 10/26/2015  . Positive GBS test 10/18/2015  . Narcotic dependence ([name]) 10/06/2015  . 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 High risk for intrapartum complications, antepartum 08/12/2015  . Chronic pain 08/04/2015  . Cocaine use 07/24/2015  . Maternal sickle cell anemia affecting pregnancy, antepartum ([name]) 07/14/2015  . Anemia of chronic disease   . Migraines 11/08/2011  . GERD (gastroesophageal reflux disease) 02/17/2011  . Depression 01/06/2011  . Sickle cell disease ([name]) 01/08/2009  . Trichotillomania 01/08/2009    Past Medical History  Diagnosis Date  . Miscarriage 03/22/2011    Pt reports 2 miscarriages.  . Depression 01/06/2011  . GERD (gastroesophageal reflux disease) 02/17/2011  . Trichotillomania     h/o  . Blood transfusion     "lots"  . Sickle cell anemia with crisis ([name])   . Exertional dyspnea     "sometimes"  . Migraines 11/08/11    "@ least twice/month"  . Chronic back pain     "very severe; have knot in my back; from tight muscle; take RX and exercise for it"  . Mood swings ([name]) 11/08/11    "I go back and forth; real bad"  . Sickle cell anemia ([name])   . Blood transfusion without reported diagnosis   . Genital HSV     Past Surgical History  Procedure Laterality Date  . Cholecystectomy  05/2010  . Dilation and curettage of uterus  02/20/11    S/P miscarriage    OB History  Gravida Para Term  Preterm AB SAB TAB Ectopic Multiple Living  3 1 1  1 1    0 1    # Outcome Date GA Lbr Len/2nd Weight Sex Delivery Anes PTL Lv  3 Current           2 Term 12/07/14 [redacted]w[redacted]d / 00:08 5 lb 4.1 oz (2.384 kg) M Vag-Spont EPI  Y  1 SAB 02/20/11             Comments: System Generated. Please review and update pregnancy details.    Obstetric Comments  Miscarried in October 2012 at about 7 weeks    Social History   Social History  . Marital Status: Single    Spouse Name: N/A  . Number of Children: N/A  . Years of Education: N/A   Social History Main Topics  . Smoking status: Former Smoker -- 0.25 packs/day for 1 years    Types: Cigarettes    Quit date: 03/25/2013  . Smokeless tobacco: Never Used  . Alcohol Use: No     Comment: pt states she quit marijuan in May 2013. Rare ETOH, + cigarettes.  She is enrolled in school  . Drug Use: Yes    Special: Other-see comments     Comment: methadone  . Sexual Activity: Yes    Birth Control/ Protection: None     Comment: last sex Jul 11 2015   Other Topics Concern  . 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 Diabetes Maternal Grandmother   . Diabetes Paternal Grandmother   . Hypertension Paternal Grandmother   . Diabetes Maternal Grandfather   . Sickle cell trait Mother   . Sickle cell trait Father     Allergies  Allergen Reactions  . Carrot [Daucus Carota] Hives, Swelling and Rash    Dietary:  Please do not send any form of carrot to patient  . Carrot Oil Hives and Swelling  . Latex Rash    *powdered Latex*     Prescriptions prior to admission  Medication Sig Dispense Refill Last Dose  . calcium carbonate (TUMS - DOSED IN MG ELEMENTAL CALCIUM) 500 MG chewable tablet Chew 2 tablets by mouth 2 (two) times daily as needed for indigestion or heartburn.   Past Month at Unknown  time  . folic acid (FOLVITE) 1 MG tablet Take 1 mg by mouth daily.   10/25/2015 at Unknown time  . methadone (DOLOPHINE) 5 MG tablet Take 1 tablet (5 mg total) by mouth daily. 30 tablet 0 10/25/2015 at Unknown time  . oxyCODONE (ROXICODONE) 15 MG immediate release tablet Take 1 tablet (15 mg total) by mouth every 4 (four) hours as needed for pain. 90 tablet 0 10/26/2015 at 1100  . Prenatal Vit-Fe Fumarate-FA (PRENATAL MULTIVITAMIN) TABS tablet Take 1 tablet by mouth daily at 12 noon.   10/26/2015 at Unknown time    Review of Systems - Negative except as per hpi  Vitals:  BP 116/60 mmHg  Pulse 106  Temp(Src) 98.4 F (36.9 C) (Oral)  Resp 18  SpO2 99%  LMP 01/24/2015 Physical Examination: CONSTITUTIONAL: Well-developed, well-nourished female in pain HENT:  Normocephalic, atraumatic EYES: Conjunctivae and EOM are normal.  SKIN: Skin is warm and dry. No rash noted. Not diaphoretic. No erythema. No pallor. CARDIOVASCULAR: Normal heart rate noted, regular rhythm RESPIRATORY: Effort and breath sounds normal, CTAB ABDOMEN: Soft, nontender, nondistended, gravid. MUSCULOSKELETAL: Normal range of motion. No edema and no tenderness. 2+ distal pulses.  Membranes:intact Fetal Monitoring:140/mod/+a/-d Tocometer: Flat  Labs:  Results for orders placed or performed during the hospital encounter of 10/26/15 (from the past 24 hour(s))  Urinalysis, Routine w reflex microscopic (not at [facility])   Collection Time: 10/26/15  3:45 PM  Result Value Ref Range   Color, Urine AMBER (A) YELLOW   APPearance CLEAR CLEAR   Specific Gravity, Urine 1.010 1.005 - 1.030   pH 5.5 5.0 - 8.0   Glucose, UA NEGATIVE NEGATIVE mg/dL   Hgb urine dipstick NEGATIVE NEGATIVE   Bilirubin Urine SMALL (A) NEGATIVE   Ketones, ur NEGATIVE NEGATIVE mg/dL   Protein, ur NEGATIVE NEGATIVE mg/dL   Nitrite NEGATIVE NEGATIVE   Leukocytes, UA TRACE (A) NEGATIVE  Urine rapid drug screen (hosp performed)   Collection Time: 10/26/15  3:45  PM  Result Value Ref Range   Opiates NONE DETECTED NONE DETECTED   Cocaine NONE DETECTED NONE DETECTED   Benzodiazepines NONE DETECTED NONE DETECTED   Amphetamines NONE DETECTED NONE DETECTED   Tetrahydrocannabinol NONE DETECTED NONE DETECTED   Barbiturates NONE DETECTED NONE DETECTED  Urine microscopic-add on   Collection Time: 10/26/15  3:45 PM  Result Value Ref Range   Squamous Epithelial / LPF 0-5 (A) NONE SEEN   WBC, UA 0-5 0 - 5 WBC/hpf  RBC / HPF NONE SEEN 0 - 5 RBC/hpf   Bacteria, UA FEW (A) NONE SEEN  CBC with Differential/Platelet   Collection Time: 10/26/15  5:00 PM  Result Value Ref Range   WBC 17.5 (H) 4.0 - 10.5 K/uL   RBC 3.14 (L) 3.87 - 5.11 MIL/uL   Hemoglobin 9.6 (L) 12.0 - 15.0 g/dL   HCT 27.3 (L) 36.0 - 46.0 %   MCV 86.9 78.0 - 100.0 fL   MCH 30.6 26.0 - 34.0 pg   MCHC 35.2 30.0 - 36.0 g/dL   RDW 19.7 (H) 11.5 - 15.5 %   Platelets 459 (H) 150 - 400 K/uL   Neutrophils Relative % 80 %   Neutro Abs 14.0 (H) 1.7 - 7.7 K/uL   Lymphocytes Relative 13 %   Lymphs Abs 2.2 0.7 - 4.0 K/uL   Monocytes Relative 6 %   Monocytes Absolute 1.1 (H) 0.1 - 1.0 K/uL   Eosinophils Relative 1 %   Eosinophils Absolute 0.2 0.0 - 0.7 K/uL   Basophils Relative 0 %   Basophils Absolute 0.0 0.0 - 0.1 K/uL  Reticulocytes   Collection Time: 10/26/15  5:00 PM  Result Value Ref Range   Retic Ct Pct 16.4 (H) 0.4 - 3.1 %   RBC. 3.14 (L) 3.87 - 5.11 MIL/uL   Retic Count, Manual 515.0 (H) 19.0 - 186.0 K/uL  Results for orders placed or performed in visit on 10/26/15 (from the past 24 hour(s))  POCT urinalysis dip (device)   Collection Time: 10/26/15  8:17 AM  Result Value Ref Range   Glucose, UA NEGATIVE NEGATIVE mg/dL   Bilirubin Urine NEGATIVE NEGATIVE   Ketones, ur NEGATIVE NEGATIVE mg/dL   Specific Gravity, Urine <=1.005 1.005 - 1.030   Hgb urine dipstick NEGATIVE NEGATIVE   pH 5.5 5.0 - 8.0   Protein, ur NEGATIVE NEGATIVE mg/dL   Urobilinogen, UA 2.0 (H) 0.0 - 1.0 mg/dL    Nitrite NEGATIVE NEGATIVE   Leukocytes, UA TRACE (A) NEGATIVE   *Note: Due to a large number of results and/or encounters for the requested time period, some results have not been displayed. A complete set of results can be found in Results Review.    Imaging Studies: Ir Fluoro Guide Cv Line Right  10/16/2015  CLINICAL DATA:  Sickle cell anemia with crisis during pregnancy. Poor IV access and need for PICC line. EXAM: POWER PICC LINE PLACEMENT WITH ULTRASOUND AND FLUOROSCOPIC GUIDANCE FLUOROSCOPY TIME:  54 seconds. PROCEDURE: The patient was advised of the possible risks and complications and agreed to undergo the procedure. The patient was then brought to the angiographic suite for the procedure. The right arm was prepped with chlorhexidine, draped in the usual sterile fashion using maximum barrier technique (cap and mask, sterile gown, sterile gloves, large sterile sheet, hand hygiene and cutaneous antisepsis) and infiltrated locally with 1% Lidocaine. Ultrasound demonstrated patency of the right brachial vein, and this was documented with an image. Under real-time ultrasound guidance, this vein was accessed with a 21 gauge micropuncture needle and image documentation was performed. A 0.018 wire was introduced in to the vein. Over this, a 5.0 [place] dual lumen power injectable PICC was advanced to the lower SVC/right atrial junction. Fluoroscopy during the procedure and fluoro spot radiograph confirms appropriate catheter position. The catheter was flushed and covered with a sterile dressing. Catheter length: 33 cm COMPLICATIONS: None IMPRESSION: Successful right arm power injectable PICC line placement with ultrasound and fluoroscopic guidance. The catheter is ready for use.  Electronically Signed   By: Aletta Edouard M.D.   On: 10/16/2015 15:43   Ir US Guide Vasc Access Right  10/16/2015  CLINICAL DATA:  Sickle cell anemia with crisis during pregnancy. Poor IV access and need for PICC line. EXAM: POWER  PICC LINE PLACEMENT WITH ULTRASOUND AND FLUOROSCOPIC GUIDANCE FLUOROSCOPY TIME:  54 seconds. PROCEDURE: The patient was advised of the possible risks and complications and agreed to undergo the procedure. The patient was then brought to the angiographic suite for the procedure. The right arm was prepped with chlorhexidine, draped in the usual sterile fashion using maximum barrier technique (cap and mask, sterile gown, sterile gloves, large sterile sheet, hand hygiene and cutaneous antisepsis) and infiltrated locally with 1% Lidocaine. Ultrasound demonstrated patency of the right brachial vein, and this was documented with an image. Under real-time ultrasound guidance, this vein was accessed with a 21 gauge micropuncture needle and image documentation was performed. A 0.018 wire was introduced in to the vein. Over this, a 5.0 [place] dual lumen power injectable PICC was advanced to the lower SVC/right atrial junction. Fluoroscopy during the procedure and fluoro spot radiograph confirms appropriate catheter position. The catheter was flushed and covered with a sterile dressing. Catheter length: 33 cm COMPLICATIONS: None IMPRESSION: Successful right arm power injectable PICC line placement with ultrasound and fluoroscopic guidance. The catheter is ready for use. Electronically Signed   By: Aletta Edouard M.D.   On: 10/16/2015 15:43   [place] Mfm Fetal Bpp Wo Non Stress  10/20/2015  OBSTETRICAL ULTRASOUND: This exam was performed within a [place] Ultrasound Department. The OB US report was generated in the AS system, and faxed to the ordering physician.  This report is available in the [facility]. See the AS Obstetric US report via the Image Link.  [place] Mfm Fetal Bpp Wo Non Stress  10/13/2015  OBSTETRICAL ULTRASOUND: This exam was performed within a [place] Ultrasound Department. The OB US report was generated in the AS system, and faxed to the ordering physician.  This report is available in the [facility]. See  the AS Obstetric US report via the Image Link.  [place] Mfm Fetal Bpp Wo Non Stress  10/06/2015  OBSTETRICAL ULTRASOUND: This exam was performed within a [place] Ultrasound Department. The OB US report was generated in the AS system, and faxed to the ordering physician.  This report is available in the [facility]. See the AS Obstetric US report via the Image Link.  [place] Mfm Fetal Bpp Wo Non Stress  09/30/2015  OBSTETRICAL ULTRASOUND: This exam was performed within a [place] Ultrasound Department. The OB US report was generated in the AS system, and faxed to the ordering physician.  This report is available in the [facility]. See the AS Obstetric US report via the Image Link.  [place] Mfm Ob Follow Up  10/13/2015  OBSTETRICAL ULTRASOUND: This exam was performed within a  Ultrasound Department. The OB US report was generated in the AS system, and faxed to the ordering physician.  This report is available in the [facility]. See the AS Obstetric US report via the Image Link.    Assessment and Plan: Patient Active Problem List   Diagnosis Date Noted  . Sickle cell pain crisis ([place]) 10/26/2015  . Positive GBS test 10/18/2015  . Narcotic dependence ([place]) 10/06/2015  . 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 High risk for intrapartum complications,  antepartum 08/12/2015  . Chronic pain 08/04/2015  . Cocaine use 07/24/2015  . Maternal sickle cell anemia affecting pregnancy, antepartum ([place]) 07/14/2015  . Anemia of chronic disease   . Migraines 11/08/2011  . GERD (gastroesophageal reflux disease) 02/17/2011  . Depression 01/06/2011  . Sickle cell disease ([place]) 01/08/2009  . Trichotillomania 01/08/2009   # sickle cell pain crisis - no signs acute chest, no signs uti. Reticulocyte count appropriate - do not think aplastic crisis. Discussed w/ triad hospitalist. - admit for pain control with  dilaudid pca - continue home methadone - IV fluids @ 125/hr - lovenox for dvt ppx - sickle cell team consult in AM - NST q shift - f/u hemoglobinopathy eval and ldh  Admit to Antenatal Routine antenatal care  Desma Maxim, MD Faculty Practice, [facility]

## 2015-10-26 NOTE — MAU Note (Signed)
Patient states she was at [facility] today and her shoulders started hurting.  She took oxycodone pain seemed to be getting worse.  Came to MAU reporting sicklecell crisis.

## 2015-10-26 NOTE — Progress Notes (Signed)
Subjective:  [name] is a 25 y.o. G3P1011 at [redacted]w[redacted]d being seen today for ongoing prenatal care.  She is currently monitored for the following issues for this high-risk pregnancy and has Sickle cell disease ([place]); Trichotillomania; Depression; GERD (gastroesophageal reflux disease); Mood swings ([place]); Migraines; Anemia of chronic disease; Maternal sickle cell anemia affecting pregnancy, antepartum ([place]); Cocaine use; Chronic pain; High risk for intrapartum complications, antepartum; Herpes simplex type 2 (HSV-2) infection affecting pregnancy, antepartum; Maternal substance abuse, antepartum; Narcotic dependence ([place]); and Positive GBS test on her problem list.  Patient reports no complaints.  Contractions: Irregular.  .  Movement: Present. Denies leaking of fluid.   The following portions of the patient's history were reviewed and updated as appropriate: allergies, current medications, past family history, past medical history, past social history, past surgical history and problem list. Problem list updated.  Objective:   Filed Vitals:   10/26/15 0818  BP: 118/54  Pulse: 103  Weight: 175 lb 1.6 oz (79.425 kg)    Fetal Status: Fetal Heart Rate (bpm): 167   Movement: Present     General:  Alert, oriented and cooperative. Patient is in no acute distress.  Skin: Skin is warm and dry. No rash noted.   Cardiovascular: Normal heart rate noted  Respiratory: Normal respiratory effort, no problems with respiration noted  Abdomen: Soft, gravid, appropriate for gestational age. Pain/Pressure: Present     Pelvic: Cervical exam deferred        Extremities: Normal range of motion.     Mental Status: Normal mood and affect. Normal behavior. Normal judgment and thought content.   Urinalysis:      Assessment and Plan:  Pregnancy: G3P1011 at [redacted]w[redacted]d  1. Herpes simplex type 2 (HSV-2) infection affecting pregnancy, antepartum, third trimester - Discussed importance of taking medication -  valACYclovir (VALTREX) 500 MG tablet; Take 1 tablet (500 mg total) by mouth 2 (two) times daily.  Dispense: 60 tablet; Refill: 1  2. Maternal sickle cell anemia affecting pregnancy, antepartum ([name]) - BPP 8/8, no need for NST today. Next BPP scheduled for next week - IOL on 6/16 - U/A NO MICRO (81003)  3. Trichotillomania  4. High risk for intrapartum complications, antepartum Reviewed all of dating for patient. She has not been able to attend routine PNC. Pt reported that date of conception was 9/26, LMP 9/10 and she had a 13 wk [place]. I changed her dating with placed her at [redacted]w[redacted]d today. Given sickle cell-- needs IOL at 39 weeks which was scheduled.  Updated box  5. Maternal substance abuse, antepartum Cocaine in March 2017 Chronic narcotic use  6. Positive GBS test Reviewed PCN in labor   Term labor symptoms and general obstetric precautions including but not limited to vaginal bleeding, contractions, leaking of fluid and fetal movement were reviewed in detail with the patient. Please refer to After Visit Summary for other counseling recommendations.  Return in about 1 week (around 11/02/2015) for Routine prenatal care.  Future Appointments Date Time Provider [place]  10/27/2015 10:15 AM [place] 4 WH-MFCUS MFC-US  10/30/2015 7:00 AM WH-BSSCHED ROOM WH-BSSCHED None  11/03/2015 9:30 AM WH-MFC [place] 1 WH-MFCUS MFC-US  11/04/2015 10:30 AM Dorena Dew, FNP [facility] None    Caren Macadam, MD

## 2015-10-26 NOTE — Progress Notes (Signed)
Pt can not afford Valtrex she has not been taking it do to cost.

## 2015-10-27 ENCOUNTER — Inpatient Hospital Stay (HOSPITAL_COMMUNITY): Payer: Medicare Other

## 2015-10-27 ENCOUNTER — Ambulatory Visit (HOSPITAL_COMMUNITY)
Admission: RE | Admit: 2015-10-27 | Payer: Medicare Other | Source: Ambulatory Visit | Attending: Obstetrics and Gynecology | Admitting: Obstetrics and Gynecology

## 2015-10-27 ENCOUNTER — Encounter: Payer: Self-pay | Admitting: *Deleted

## 2015-10-27 ENCOUNTER — Encounter (HOSPITAL_COMMUNITY): Payer: Self-pay | Admitting: *Deleted

## 2015-10-27 DIAGNOSIS — D571 Sickle-cell disease without crisis: Secondary | ICD-10-CM | POA: Diagnosis not present

## 2015-10-27 DIAGNOSIS — O99019 Anemia complicating pregnancy, unspecified trimester: Secondary | ICD-10-CM | POA: Diagnosis not present

## 2015-10-27 DIAGNOSIS — F1120 Opioid dependence, uncomplicated: Secondary | ICD-10-CM | POA: Diagnosis present

## 2015-10-27 DIAGNOSIS — F329 Major depressive disorder, single episode, unspecified: Secondary | ICD-10-CM | POA: Diagnosis present

## 2015-10-27 DIAGNOSIS — O9989 Other specified diseases and conditions complicating pregnancy, childbirth and the puerperium: Secondary | ICD-10-CM | POA: Diagnosis present

## 2015-10-27 DIAGNOSIS — O99013 Anemia complicating pregnancy, third trimester: Secondary | ICD-10-CM

## 2015-10-27 DIAGNOSIS — O9902 Anemia complicating childbirth: Secondary | ICD-10-CM | POA: Diagnosis present

## 2015-10-27 DIAGNOSIS — O99344 Other mental disorders complicating childbirth: Secondary | ICD-10-CM | POA: Diagnosis present

## 2015-10-27 DIAGNOSIS — O9962 Diseases of the digestive system complicating childbirth: Secondary | ICD-10-CM | POA: Diagnosis present

## 2015-10-27 DIAGNOSIS — Z87891 Personal history of nicotine dependence: Secondary | ICD-10-CM | POA: Diagnosis not present

## 2015-10-27 DIAGNOSIS — A6000 Herpesviral infection of urogenital system, unspecified: Secondary | ICD-10-CM | POA: Diagnosis present

## 2015-10-27 DIAGNOSIS — Z3A38 38 weeks gestation of pregnancy: Secondary | ICD-10-CM | POA: Diagnosis not present

## 2015-10-27 DIAGNOSIS — O9912 Other diseases of the blood and blood-forming organs and certain disorders involving the immune mechanism complicating childbirth: Secondary | ICD-10-CM | POA: Diagnosis present

## 2015-10-27 DIAGNOSIS — O9832 Other infections with a predominantly sexual mode of transmission complicating childbirth: Secondary | ICD-10-CM | POA: Diagnosis present

## 2015-10-27 DIAGNOSIS — G8929 Other chronic pain: Secondary | ICD-10-CM | POA: Diagnosis present

## 2015-10-27 DIAGNOSIS — O99824 Streptococcus B carrier state complicating childbirth: Secondary | ICD-10-CM | POA: Diagnosis present

## 2015-10-27 DIAGNOSIS — O99324 Drug use complicating childbirth: Secondary | ICD-10-CM | POA: Diagnosis present

## 2015-10-27 DIAGNOSIS — Z79899 Other long term (current) drug therapy: Secondary | ICD-10-CM | POA: Diagnosis not present

## 2015-10-27 DIAGNOSIS — K219 Gastro-esophageal reflux disease without esophagitis: Secondary | ICD-10-CM | POA: Diagnosis present

## 2015-10-27 DIAGNOSIS — F1490 Cocaine use, unspecified, uncomplicated: Secondary | ICD-10-CM | POA: Diagnosis present

## 2015-10-27 DIAGNOSIS — D5700 Hb-SS disease with crisis, unspecified: Secondary | ICD-10-CM | POA: Diagnosis not present

## 2015-10-27 LAB — CREATININE, SERUM: CREATININE: 0.46 mg/dL (ref 0.44–1.00)

## 2015-10-27 MED ORDER — DIPHENHYDRAMINE HCL 25 MG PO CAPS
25.0000 mg | ORAL_CAPSULE | ORAL | Status: DC | PRN
  Administered 2015-10-27 – 2015-10-30 (×4): 25 mg via ORAL
  Filled 2015-10-27 (×4): qty 1

## 2015-10-27 MED ORDER — HYDROMORPHONE HCL 1 MG/ML IJ SOLN
1.0000 mg | INTRAMUSCULAR | Status: DC | PRN
  Administered 2015-10-27 – 2015-10-30 (×21): 1 mg via INTRAVENOUS
  Filled 2015-10-27 (×22): qty 1

## 2015-10-27 MED ORDER — NALOXONE HCL 0.4 MG/ML IJ SOLN: 0.4000 mg | INTRAMUSCULAR | Status: DC | PRN

## 2015-10-27 MED ORDER — SODIUM CHLORIDE 0.9% FLUSH: 9.0000 mL | INTRAVENOUS | Status: DC | PRN

## 2015-10-27 MED ORDER — HYDROMORPHONE 1 MG/ML IV SOLN
INTRAVENOUS | Status: DC
  Administered 2015-10-27: 9.5 mL via INTRAVENOUS
  Administered 2015-10-27: 2.1 mL via INTRAVENOUS
  Administered 2015-10-27: 9.8 mg via INTRAVENOUS
  Administered 2015-10-28: 15.4 mg via INTRAVENOUS
  Administered 2015-10-28: 7 mL via INTRAVENOUS
  Administered 2015-10-28: 7 mg via INTRAVENOUS
  Administered 2015-10-28: 15:00:00 via INTRAVENOUS
  Administered 2015-10-28: 7 mg via INTRAVENOUS
  Administered 2015-10-28: 9.5 mL via INTRAVENOUS
  Administered 2015-10-28: 25.9 mL via INTRAVENOUS
  Administered 2015-10-29: 5.9 mg via INTRAVENOUS
  Administered 2015-10-29: 2.8 mg via INTRAVENOUS
  Administered 2015-10-29: 23:00:00 via INTRAVENOUS
  Administered 2015-10-29: 5.6 mL via INTRAVENOUS
  Administered 2015-10-29: 7.7 mg via INTRAVENOUS
  Administered 2015-10-29: 2.5 mL via INTRAVENOUS
  Administered 2015-10-29: 9.2 mL via INTRAVENOUS
  Administered 2015-10-30: 14:00:00 via INTRAVENOUS
  Administered 2015-10-30: 4.2 mg via INTRAVENOUS
  Administered 2015-10-30: 9.8 mg via INTRAVENOUS
  Administered 2015-10-30: 8.97 mg via INTRAVENOUS
  Filled 2015-10-27 (×7): qty 25

## 2015-10-27 MED ORDER — SODIUM CHLORIDE 0.9 % IV SOLN
25.0000 mg | INTRAVENOUS | Status: DC | PRN
  Filled 2015-10-27: qty 0.5

## 2015-10-27 MED ORDER — VALACYCLOVIR HCL 500 MG PO TABS: 500.0000 mg | ORAL_TABLET | Freq: Two times a day (BID) | ORAL | Status: DC

## 2015-10-27 MED ORDER — ONDANSETRON HCL 4 MG/2ML IJ SOLN: 4.0000 mg | Freq: Four times a day (QID) | INTRAMUSCULAR | Status: DC | PRN

## 2015-10-27 NOTE — Progress Notes (Addendum)
Daily Antepartum Note  Admission Date: 10/26/2015 Current Date: 10/27/2015 7:26 AM  [name] is a 25 y.o. G3P1011 @ [redacted]w[redacted]d , HD#2, admitted for sickle cell pain.  Pregnancy complicated by: Patient Active Problem List   Diagnosis Date Noted  . Sickle cell pain crisis ([place]) 10/26/2015  . Positive GBS test 10/18/2015  . Narcotic dependence ([place]) 10/06/2015  . 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 High risk for intrapartum complications, antepartum 08/12/2015  . Chronic pain 08/04/2015  . Cocaine use 07/24/2015  . Maternal sickle cell anemia affecting pregnancy, antepartum ([place]) 07/14/2015  . Anemia of chronic disease   . Migraines 11/08/2011  . GERD (gastroesophageal reflux disease) 02/17/2011  . Depression 01/06/2011  . Sickle cell disease ([place]) 01/08/2009  . Trichotillomania 01/08/2009    Overnight/24hr events:  Patient used 4mg  of demand dilaudid PCA and 1mg  dilaudid for breakthrough  Subjective:  Patient states that left shoulder pain is stable. No fevers, chills, or OB s/s.   Objective:    Current Vital Signs 24h Vital Sign Ranges  T 98.3 F (36.8 C) Temp  Avg: 98.7 F (37.1 C)  Min: 98.3 F (36.8 C)  Max: 99.3 F (37.4 C)  BP (!) 105/55 mmHg BP  Min: 90/40  Max: 118/54  HR 84 Pulse  Avg: 100.5  Min: 84  Max: 108  RR 16 Resp  Avg: 16.7  Min: 14  Max: 18  SaO2 99 % Not Delivered SpO2  Avg: 98.1 %  Min: 97 %  Max: 99 %       24 Hour I/O Current Shift I/O  Time Ins Outs       Physical exam: General: Well nourished, well developed female in no acute distress. Abdomen: gravid nttp Cardiovascular: normal s1 and s2, no MRGs Respiratory: CTAB Extremities: no clubbing, cyanosis or edema Skin: Warm and dry.   Medications: Current Facility-Administered Medications  Medication Dose Route Frequency Provider Last Rate Last Dose  . acetaminophen (TYLENOL) tablet  650 mg  650 mg Oral Q4H PRN Gwynne Edinger, MD      . calcium carbonate (TUMS - dosed in mg elemental calcium) chewable tablet 400 mg of elemental calcium  2 tablet Oral Q4H PRN Gwynne Edinger, MD      . dextrose 5 % and 0.45 % NaCl with KCl 10 mEq/L infusion   Intravenous Continuous Gwynne Edinger, MD 125 mL/hr at 10/27/15 0513    . diphenhydrAMINE (BENADRYL) injection 12.5 mg  12.5 mg Intravenous Q6H PRN Gwynne Edinger, MD   12.5 mg at 10/26/15 2319   Or  . diphenhydrAMINE (BENADRYL) 12.5 MG/5ML elixir 12.5 mg  12.5 mg Oral Q6H PRN Gwynne Edinger, MD      . docusate sodium (COLACE) capsule 100 mg  100 mg Oral Daily Gwynne Edinger, MD      . enoxaparin (LOVENOX) injection 40 mg  40 mg Subcutaneous Q24H Gwynne Edinger, MD   40 mg at 10/26/15 2333  . folic acid (FOLVITE) tablet 1 mg  1 mg Oral Daily Gwynne Edinger, MD      . HYDROmorphone (DILAUDID) 1 mg/mL PCA injection   Intravenous Q4H Gwynne Edinger, MD      . HYDROmorphone (DILAUDID) injection 1 mg  1 mg Intravenous Q3H PRN Gwynne Edinger, MD   1 mg at 10/27/15 0657  . methadone (DOLOPHINE) tablet 5 mg  5 mg Oral  Daily Gwynne Edinger, MD      . naloxone [facility]) injection 0.4 mg  0.4 mg Intravenous PRN Gwynne Edinger, MD       And  . sodium chloride flush (NS) 0.9 % injection 9 mL  9 mL Intravenous PRN Gwynne Edinger, MD      . ondansetron [facility]) injection 4 mg  4 mg Intravenous Q6H PRN Gwynne Edinger, MD      . prenatal multivitamin tablet 1 tablet  1 tablet Oral Q1200 Gwynne Edinger, MD      . valACYclovir (VALTREX) tablet 500 mg  500 mg Oral BID Gwynne Edinger, MD   500 mg at 10/26/15 2333    Labs:   Recent Labs Lab 10/21/15 1820 10/26/15 1700  WBC 12.7* 17.5*  HGB 8.9* 9.6*  HCT 26.1* 27.3*  PLT 356 459*     Recent Labs Lab 10/21/15 1820 10/27/15 0015  NA 136  --   K 3.5  --   CL 108  --   CO2 22  --   BUN <5*  --   CREATININE 0.38* 0.46  CALCIUM 8.5*  --   PROT 6.6   --   BILITOT 2.7*  --   ALKPHOS 126  --   ALT 47  --   AST 36  --   GLUCOSE 80  --    Pending: hemoglobinopathy panel Negative UDS (rapid), u/a, retic (stable)  Radiology: none  Assessment & Plan:  Pt stable *IUP: routine care. qday PNV and bid NSTs. Set up for IOL for 6/16 (39wks) already but will need to verify with L&D. GBS tx in labor. BPP on 6/6 8/8, cephalic and [id number], AC 90%. -bid NSTs -rpt AFI today *Heme: call [place] team today. Follow up labs *Pain: continue on methadone and on dilaudid PCA. [place] team to adjust *HSV: continue with PPx. Spec exam at IOL *PPx: lovenox qday, OOB ad lib *Social: rapid UDS negative on admission  Durene Romans. MD Attending Center for [facility])

## 2015-10-27 NOTE — Consult Note (Signed)
Sickle Cell Service Medical Consultation  [name] [id number] DOB: [birth year] DOA: 10/26/2015 PCP: Angelica Chessman, MD   Requesting physician: Durene Romans MD Date of consultation: 10/27/2015 Reason for consultation: Sickle Cell Pain Crisis  Impression/Recommendations Active Problems:   Sickle cell pain crisis ([place])  Principal Problem:  Maternal sickle cell anemia affecting pregnancy, antepartum ([name]) Active Problems:  Sickle cell disease ([name])  Herpes simplex type 2 (HSV-2) infection affecting pregnancy, antepartum  Positive GBS test  Sickle cell pain crisis ([place])  1. Hb SS with crisis: Start weight-based Dilaudid PCA and clinician assisted doses every 3 hours prn breakthrough pain, IVF at 125 cc/hour, apply heating pad to affected areas. 2. Leukocytosis: No evidence of infection. This is likely associated with the inflammatory state associated with her crisis. 3. Anemia of chronic disease: Hemoglobin of is 9.6, appropriate for this level of pregnancy, will continue to monitor. Target Hb at this stage is 8.  4. Chronic pain: Continue methadone 5 mg daily at bedtime. 5. Third trimester pregnancy: Scheduled for induction of labor. Continue to monitor per OB  Sickle Cell Team will followup again tomorrow. Please contact me if I can be of assistance in the meanwhile. Thank you for this consultation.  Chief Complaint:  Left Shoulder Pain  HPI:  [name] is a 25 y.o. opiate tolerant patient with history of sickle cell disease and she is at 38+ weeks gestation who presented with right arm and left shoulder pain for few days duration. She says this is typical of her sickle cell pain crisis. She denies any headache, no changes in vision, no numbness or tingling, no abdominal pain, no chest pain, no shortness of breath, no urinary symptom. No fever. She is being admitted to antepartum unit for management of her sickle cell pain crisis and has been scheduled  to have induction of labor on 10/30/2015. Sickle cell medical service is therefore consulted. Patient is currently on low dose dilaudid PCA, and no clinician assisted dose. She is unable to get Toradol because of pregnancy. She tested + for GBS and would be treated intrapartum. 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 She does not have any other complaint.  Review of Systems:  Constitutional: Negative for fever, chills weight loss HENT: Negative for ear pain, nosebleeds, congestion, facial swelling, rhinorrhea, neck pain, neck stiffness and ear discharge.   Respiratory: Negative for cough, shortness of breath, wheezing  Cardiovascular: Negative for chest pain, palpitations and leg swelling.  Gastrointestinal: Negative for heartburn, abdominal pain, vomiting, diarrhea or consitpation Genitourinary: Negative for dysuria, urgency, frequency, hematuria Musculoskeletal: Left shoulder pain, low back pain Neurological: Negative for dizziness, seizures, syncope, focal weakness,  numbness and headaches.  Hematological: Does not bruise/bleed easily.  Psychiatric/Behavioral: Negative for hallucinations, confusion, dysphoric mood   Past Medical History  Diagnosis Date  . Miscarriage 03/22/2011    Pt reports 2 miscarriages.  . Depression 01/06/2011  . GERD (gastroesophageal reflux disease) 02/17/2011  . Trichotillomania     h/o  . Blood transfusion     "lots"  . Sickle cell anemia with crisis ([place])   . Exertional dyspnea     "sometimes"  . Migraines 11/08/11    "@ least twice/month"  . Chronic back pain     "very severe; have knot in my back; from tight muscle; take RX and exercise for it"  . Mood swings ([place]) 11/08/11    "I go back and forth; real bad"  . Sickle cell anemia ([name])   .  Blood transfusion without reported diagnosis   . Genital HSV    Past Surgical History  Procedure Laterality Date  . Cholecystectomy  05/2010  . Dilation and curettage of uterus  02/20/11    S/P miscarriage    Social History:  reports that she quit smoking about 2 years ago. Her smoking use included Cigarettes. She has a .25 pack-year smoking history. She has never used smokeless tobacco. She reports that she uses illicit drugs (Other-see comments). She reports that she does not drink alcohol.  Allergies  Allergen Reactions  . Carrot [Daucus Carota] Hives, Swelling and Rash    Dietary:  Please do not send any form of carrot to patient  . Carrot Oil Hives and Swelling  . Latex Rash    *powdered Latex*    Family History  Problem Relation Age of Onset  . Diabetes Maternal Grandmother   . Diabetes Paternal Grandmother   . Hypertension Paternal Grandmother   . Diabetes Maternal Grandfather   . Sickle cell trait Mother   . Sickle cell trait Father     Prior to Admission medications   Medication Sig Start Date End Date Taking? Authorizing Provider  calcium carbonate (TUMS - DOSED IN MG ELEMENTAL CALCIUM) 500 MG chewable tablet Chew 2 tablets by mouth 2 (two) times daily as needed for indigestion or heartburn.   Yes Historical Provider, MD  folic acid (FOLVITE) 1 MG tablet Take 1 mg by mouth daily.   Yes Historical Provider, MD  methadone (DOLOPHINE) 5 MG tablet Take 1 tablet (5 mg total) by mouth daily. 10/09/15  Yes Perina Salvaggio Essie Christine, MD  oxyCODONE (ROXICODONE) 15 MG immediate release tablet Take 1 tablet (15 mg total) by mouth every 4 (four) hours as needed for pain. 10/22/15  Yes Tresa Garter, MD  Prenatal Vit-Fe Fumarate-FA (PRENATAL MULTIVITAMIN) TABS tablet Take 1 tablet by mouth daily at 12 noon.   Yes Historical Provider, MD  valACYclovir (VALTREX) 500 MG tablet Take 1 tablet (500 mg total) by mouth 2 (two) times daily. 10/27/15   Caren Macadam, MD    Physical Exam: Blood pressure 96/36, pulse 93, temperature 98.9 F (37.2 C), temperature source Oral, resp. rate 17, weight 81.307 kg (179 lb 4 oz), last menstrual period 01/24/2015, SpO2 99 %, not currently  breastfeeding. @VITALS2 @ [employer]   10/27/15 0907  Weight: 81.307 kg (179 lb 4 oz)   No intake or output data in the 24 hours ending 10/27/15 1159   Constitutional: Appears well-developed and well-nourished. No distress. HENT: Normocephalic. External right and left ear normal. Oropharynx is clear and moist.  Eyes: Conjunctivae and EOM are normal. PERRLA, no scleral icterus.  Neck: Normal ROM. Neck supple. No JVD. No tracheal deviation. No thyromegaly.  CVS: RRR, S1/S2 +, no murmurs, no gallops, no carotid bruit.  Pulmonary: Effort and breath sounds normal, no stridor, rhonchi, wheezes, rales.  Abdominal: Soft. BS +,  gravid distension, no tenderness, rebound or guarding.  Musculoskeletal: Normal range of motion. No edema and no tenderness.  Neuro: Alert. Normal reflexes, muscle tone coordination. No cranial nerve deficit. Skin: Skin is warm and dry. No rash noted. Not diaphoretic. No erythema. No pallor.  Psychiatric: Normal mood and affect. Behavior, judgment, thought content normal.   Labs on Admission:  Basic Metabolic Panel:  Recent Labs Lab 10/21/15 1820 10/27/15 0015  NA 136  --   K 3.5  --   CL 108  --   CO2 22  --   GLUCOSE 80  --  BUN <5*  --   CREATININE 0.38* 0.46  CALCIUM 8.5*  --    Liver Function Tests:  Recent Labs Lab 10/21/15 1820  AST 36  ALT 47  ALKPHOS 126  BILITOT 2.7*  PROT 6.6  ALBUMIN 3.0*   No results for input(s): LIPASE, AMYLASE in the last 168 hours. No results for input(s): AMMONIA in the last 168 hours. CBC:  Recent Labs Lab 10/21/15 1820 10/26/15 1700  WBC 12.7* 17.5*  NEUTROABS 9.8* 14.0*  HGB 8.9* 9.6*  HCT 26.1* 27.3*  MCV 86.4 86.9  PLT 356 459*   Cardiac Enzymes: No results for input(s): CKTOTAL, CKMB, CKMBINDEX, TROPONINI in the last 168 hours. BNP: Invalid input(s): POCBNP CBG: No results for input(s): GLUCAP in the last 168 hours.  Time spent: 55 minutes  Joyceline Maiorino, MD [facility] To page rounding or on call physician  [email or web address] 10/27/2015, 11:59 AM

## 2015-10-27 NOTE — Progress Notes (Signed)
Chaplain received a referral from the staff Nurse to provide spiritual care support for this patient.  Chaplain provided a listening presence for the patient as she shared how she is feeling regarding her current medical and maternity status. She states she has many hopes and desires for her and her children and desired prayer that she will be able to provide a stable life for all of them. Chaplain offered a prayer of agreement for the patient's stated  needs, Chaplain will follow up with the patient for ongoing support. Chaplain Yaakov Guthrie [phone]

## 2015-10-27 NOTE — Progress Notes (Signed)
Verified sickle cell PCA setting with Vonzella Nipple, RN

## 2015-10-27 NOTE — ED Notes (Signed)
Chart dismissed.  This Agricultural consultant confirmed that the Pt is currently at [facility] who placed Pt's name in expected.

## 2015-10-27 NOTE — Addendum Note (Signed)
Addended by: Shelly Coss on: 10/27/2015 07:58 AM   Modules accepted: Orders

## 2015-10-28 DIAGNOSIS — O99013 Anemia complicating pregnancy, third trimester: Secondary | ICD-10-CM | POA: Diagnosis not present

## 2015-10-28 DIAGNOSIS — Z3A38 38 weeks gestation of pregnancy: Secondary | ICD-10-CM | POA: Diagnosis not present

## 2015-10-28 DIAGNOSIS — D5700 Hb-SS disease with crisis, unspecified: Secondary | ICD-10-CM | POA: Diagnosis not present

## 2015-10-28 DIAGNOSIS — Z3A37 37 weeks gestation of pregnancy: Secondary | ICD-10-CM

## 2015-10-28 DIAGNOSIS — O9902 Anemia complicating childbirth: Secondary | ICD-10-CM | POA: Diagnosis not present

## 2015-10-28 LAB — COMPREHENSIVE METABOLIC PANEL
ALBUMIN: 2.9 g/dL — AB (ref 3.5–5.0)
ALK PHOS: 138 U/L — AB (ref 38–126)
ALT: 47 U/L (ref 14–54)
ANION GAP: 3 — AB (ref 5–15)
AST: 29 U/L (ref 15–41)
BILIRUBIN TOTAL: 2.3 mg/dL — AB (ref 0.3–1.2)
CALCIUM: 8 mg/dL — AB (ref 8.9–10.3)
CO2: 23 mmol/L (ref 22–32)
Chloride: 106 mmol/L (ref 101–111)
Creatinine, Ser: 0.58 mg/dL (ref 0.44–1.00)
GFR calc Af Amer: >60 mL/min (ref >60)
GLUCOSE: 401 mg/dL — AB (ref 65–99)
Potassium: 4.7 mmol/L (ref 3.5–5.1)
Sodium: 132 mmol/L — ABNORMAL LOW (ref 135–145)
TOTAL PROTEIN: 5.7 g/dL — AB (ref 6.5–8.1)

## 2015-10-28 LAB — CBC WITH DIFFERENTIAL/PLATELET
BASOS ABS: 0 10*3/uL (ref 0.0–0.1)
BASOS PCT: 0 %
EOS PCT: 2 %
Eosinophils Absolute: 0.3 10*3/uL (ref 0.0–0.7)
HEMATOCRIT: 23 % — AB (ref 36.0–46.0)
HEMOGLOBIN: 7.9 g/dL — AB (ref 12.0–15.0)
LYMPHS PCT: 13 %
Lymphs Abs: 2 10*3/uL (ref 0.7–4.0)
MCH: 30.4 pg (ref 26.0–34.0)
MCHC: 34.3 g/dL (ref 30.0–36.0)
MCV: 88.5 fL (ref 78.0–100.0)
MONOS PCT: 9 %
Monocytes Absolute: 1.4 10*3/uL — ABNORMAL HIGH (ref 0.1–1.0)
Neutro Abs: 11.4 10*3/uL — ABNORMAL HIGH (ref 1.7–7.7)
Neutrophils Relative %: 76 %
OTHER: 0 %
Platelets: 358 10*3/uL (ref 150–400)
RBC: 2.6 MIL/uL — ABNORMAL LOW (ref 3.87–5.11)
RDW: 18.8 % — ABNORMAL HIGH (ref 11.5–15.5)
WBC: 15.1 10*3/uL — ABNORMAL HIGH (ref 4.0–10.5)

## 2015-10-28 MED ORDER — WITCH HAZEL-GLYCERIN EX PADS: MEDICATED_PAD | CUTANEOUS | Status: DC | PRN

## 2015-10-28 NOTE — Progress Notes (Signed)
FACULTY PRACTICE ANTEPARTUM(COMPREHENSIVE) NOTE  [name] is a 25 y.o. G3P1011 at [redacted]w[redacted]d by early ultrasound who is admitted for sickle cell crisis.   Fetal presentation is cephalic. Length of Stay:  1  Days  Subjective: Vulvar itching no discharge Patient reports the fetal movement as active. Patient reports uterine contraction  activity as ocasional. Patient reports  vaginal bleeding as none. Patient describes fluid per vagina as None.  Vitals:  Blood pressure 104/66, pulse 98, temperature 98.2 F (36.8 C), temperature source Oral, resp. rate 18, height 5\' 1"  (1.549 m), weight 183 lb 0.1 oz (83.011 kg), last menstrual period 01/24/2015, SpO2 38 %, not currently breastfeeding. Physical Examination:  General appearance - alert, well appearing, and in no distress Heart - normal rate and regular rhythm Abdomen - soft, nontender, nondistended Fundal Height:  size equals dates Cervical Exam: Not evaluated. No vulvar lesions or discharge noted, nurse present as chaperone. Extremities: extremities normal, atraumatic, no cyanosis or edema and Homans sign is negative, no sign of DVT Membranes:intact  FHR 150 baseline reactive  Labs:  No results found. However, due to the size of the patient record, not all encounters were searched. Please check Results Review for a complete set of results.  Imaging Studies:     Currently EPIC will not allow sonographic studies to automatically populate into notes.  In the meantime, copy and paste results into note or free text.  Medications:  Scheduled . docusate sodium  100 mg Oral Daily  . enoxaparin (LOVENOX) injection  40 mg Subcutaneous Q24H  . folic acid  1 mg Oral Daily  . HYDROmorphone   Intravenous Q4H  . methadone  5 mg Oral Daily  . prenatal multivitamin  1 tablet Oral Q1200  . valACYclovir  500 mg Oral BID   I have reviewed the patient's current medications.  ASSESSMENT: Patient Active Problem List   Diagnosis Date Noted  .  Sickle cell pain crisis ([place]) 10/26/2015  . Positive GBS test 10/18/2015  . Narcotic dependence ([place]) 10/06/2015  . 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 High risk for intrapartum complications, antepartum 08/12/2015  . Chronic pain 08/04/2015  . Cocaine use 07/24/2015  . Maternal sickle cell anemia affecting pregnancy, antepartum ([place]) 07/14/2015  . Anemia of chronic disease   . Migraines 11/08/2011  . GERD (gastroesophageal reflux disease) 02/17/2011  . Depression 01/06/2011  . Sickle cell disease ([place]) 01/08/2009  . 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 Medicine is following  ARNOLD,JAMES 10/28/2015,7:07 AM

## 2015-10-28 NOTE — Progress Notes (Addendum)
Pt was lying in bed when I arrived. She was awake and alert and appreciated the visit. She remembered me from our visits at [facility]. She shared she has a 69 mos old son and is about to have a girl. Pt said she is a little nervous about becoming a parent again. She said she had been wanted her to hurry and get here but admits now that its time she is a little nervous about knowing what to do. We discussed this for a moment and she reflected on how she knew what to do for her son and resolved she will also know for her daughter.  She enjoyed talking about her children; showing pictures and even her maternity shots. She also enjoyed talking about her faith and church. She asked for prayer before the conclusion of our visit and would like a follow-up visit. Please page if assistance is needed prior to next visit.  [place], M.Div. [phone]   10/28/15 2100  Clinical Encounter Type  Visited With Patient

## 2015-10-28 NOTE — Progress Notes (Signed)
Attempted to draw labs off PICC line, unsuccessful, IV team paged to draw labs and change PICC dressing

## 2015-10-28 NOTE — Progress Notes (Signed)
Patient ID: [name], female   DOB: Feb 06, 1991, 25 y.o.   MRN: [id number] Subjective:  Patient complained of on-going pain in her left shoulder and lower back. She rates it at 8/10. She is requesting dilaudid 2 mg every 2-3 hours plus her PCA. She is scheduled for induction of labor on 10/30/2015.  Objective:  Vital signs in last 24 hours:  Filed Vitals:   10/28/15 0628 10/28/15 0908 10/28/15 0946 10/28/15 1329  BP:  105/62    Pulse:  91    Temp:  97.9 F (36.6 C)    TempSrc:  Oral    Resp: 18 13 14 15   Height:      Weight:      SpO2: 38% 98%      Intake/Output from previous day:   Intake/Output Summary (Last 24 hours) at 10/28/15 1337 Last data filed at 10/28/15 1020  Gross per 24 hour  Intake   1720 ml  Output   1800 ml  Net    -80 ml    Physical Exam: General: Alert, awake, oriented x3, in no acute distress.  HEENT: [place]/AT PEERL, EOMI Neck: Trachea midline,  no masses, no thyromegal,y no JVD, no carotid bruit OROPHARYNX:  Moist, No exudate/ erythema/lesions.  Heart: Regular rate and rhythm, without murmurs, rubs, gallops, PMI non-displaced, no heaves or thrills on palpation.  Lungs: Clear to auscultation, no wheezing or rhonchi noted. No increased vocal fremitus resonant to percussion  Abdomen: Soft, nontender, gravid, positive bowel sounds, no masses no hepatosplenomegaly noted..  Neuro: No focal neurological deficits noted cranial nerves II through XII grossly intact. DTRs 2+ bilaterally upper and lower extremities. Strength 5 out of 5 in bilateral upper and lower extremities. Musculoskeletal: No warm swelling or erythema around joints, no spinal tenderness noted. Psychiatric: Patient alert and oriented x3, good insight and cognition, good recent to remote recall. Lymph node survey: No cervical axillary or inguinal lymphadenopathy noted.  Lab Results:  Basic Metabolic Panel:    Component Value Date/Time   NA 136 10/21/2015 1820   NA 139 05/06/2012 1645    K 3.5 10/21/2015 1820   K 3.9 05/06/2012 1645   CL 108 10/21/2015 1820   CL 110* 05/06/2012 1645   CO2 22 10/21/2015 1820   CO2 22 05/06/2012 1645   BUN <5* 10/21/2015 1820   BUN 9 05/06/2012 1645   CREATININE 0.46 10/27/2015 0015   CREATININE 0.43* 07/06/2015 1356   CREATININE 0.57* 05/06/2012 1645   GLUCOSE 80 10/21/2015 1820   GLUCOSE 80 08/12/2015 0605   GLUCOSE 84 05/06/2012 1645   CALCIUM 8.5* 10/21/2015 1820   CALCIUM 8.3* 05/06/2012 1645   CBC:    Component Value Date/Time   WBC 17.5* 10/26/2015 1700   WBC 14.2* 05/11/2012 0721   HGB 9.6* 10/26/2015 1700   HGB 8.7* 05/11/2012 0721   HCT 27.3* 10/26/2015 1700   HCT 26.9* 05/11/2012 0721   PLT 459* 10/26/2015 1700   PLT 497* 05/11/2012 0721   MCV 86.9 10/26/2015 1700   MCV 94 05/11/2012 0721   NEUTROABS 14.0* 10/26/2015 1700   NEUTROABS 10.9* 05/10/2012 0552   LYMPHSABS 2.2 10/26/2015 1700   LYMPHSABS 3.5 05/10/2012 0552   MONOABS 1.1* 10/26/2015 1700   MONOABS 1.7* 05/10/2012 0552   EOSABS 0.2 10/26/2015 1700   EOSABS 0.8* 05/10/2012 0552   BASOSABS 0.0 10/26/2015 1700   BASOSABS 0.1 05/10/2012 0552   BASOSABS 1 05/09/2012 0939    No results found for this or any previous visit (  from the past 240 hour(s)).  Studies/Results: [place] Mfm Ob Limited  10/27/2015  OBSTETRICAL ULTRASOUND: This exam was performed within a  Ultrasound Department. The OB US report was generated in the AS system, and faxed to the ordering physician.  This report is available in the [employer]. See the AS Obstetric US report via the Image Link.   Medications: Scheduled Meds: . docusate sodium  100 mg Oral Daily  . enoxaparin (LOVENOX) injection  40 mg Subcutaneous Q24H  . folic acid  1 mg Oral Daily  . HYDROmorphone   Intravenous Q4H  . methadone  5 mg Oral Daily  . prenatal multivitamin  1 tablet Oral Q1200  . valACYclovir  500 mg Oral BID   Continuous Infusions: . dextrose 5 % and 0.45 % NaCl with KCl 10 mEq/L 150  mL/hr at 10/28/15 1329   PRN Meds:.acetaminophen, calcium carbonate, diphenhydrAMINE **OR** diphenhydrAMINE (BENADRYL) IVPB(SICKLE CELL ONLY), HYDROmorphone (DILAUDID) injection, naloxone **AND** sodium chloride flush, ondansetron (ZOFRAN) IV, witch hazel-glycerin  Procedures:  Scheduled for Induction of Labor on 10/30/2015  Antibiotics:  On Valtrex for Genital Herpes  Assessment/Plan: Active Problems:   Sickle cell pain crisis ([place])  1. Hb SS with crisis: Continue PCA and clinician assisted Dilaudid at current doses.  2. Leukocytosis: No evidence of infection. The leukocytosis is likely associated with the inflammatory state associated with her crisis. 3. Anemia of chronic disease: The hemoglobin is stable at 7.9. Continue to monitor 4. Chronic pain: Continue methadone 5 mg daily at bedtime. 5. Third trimester pregnancy: Patient is [redacted]w[redacted]d of gestation, awaiting induction of labor at 39w. Continue maternal/fetal monitoring per OB Code Status: Full Code Family Communication: N/A Disposition Plan: Not yet ready for discharge  Raizel Wesolowski  If 7PM-7AM, please contact night-coverage.  10/28/2015, 1:37 PM  LOS: 1 day

## 2015-10-28 NOTE — Progress Notes (Signed)
Spoke with [facility] IV team, Christina (RN) stated [facility] or ICU to change dressing and get labs from PICC. Christina informed no ICU in house and [facility] never been checked off.

## 2015-10-29 ENCOUNTER — Inpatient Hospital Stay (HOSPITAL_COMMUNITY): Payer: Medicare Other

## 2015-10-29 ENCOUNTER — Telehealth: Payer: Self-pay

## 2015-10-29 DIAGNOSIS — Z3A38 38 weeks gestation of pregnancy: Secondary | ICD-10-CM | POA: Diagnosis not present

## 2015-10-29 DIAGNOSIS — D5700 Hb-SS disease with crisis, unspecified: Secondary | ICD-10-CM

## 2015-10-29 DIAGNOSIS — O99013 Anemia complicating pregnancy, third trimester: Secondary | ICD-10-CM | POA: Diagnosis not present

## 2015-10-29 DIAGNOSIS — O9902 Anemia complicating childbirth: Secondary | ICD-10-CM | POA: Diagnosis not present

## 2015-10-29 LAB — GLUCOSE, CAPILLARY: Glucose-Capillary: 102 mg/dL — ABNORMAL HIGH (ref 65–99)

## 2015-10-29 LAB — HEMOGLOBINOPATHY EVALUATION
HGB A2 QUANT: 4.9 % — AB (ref 0.7–3.1)
HGB C: 0 %
HGB F QUANT: 5.2 % — AB (ref 0.0–2.0)
Hgb A: 35.4 % — ABNORMAL LOW (ref 94.0–98.0)
Hgb S Quant: 54.5 % — ABNORMAL HIGH

## 2015-10-29 NOTE — Telephone Encounter (Signed)
Pt is requesting a medication refill for Oxycodone. Thanks!

## 2015-10-29 NOTE — Progress Notes (Signed)
Fetal movement palpated.

## 2015-10-29 NOTE — Progress Notes (Signed)
SICKLE CELL SERVICE PROGRESS NOTE  [name] [id number] DOB: [birth year] DOA: 10/26/2015 PCP: Angelica Chessman, MD  Assessment/Plan: Principal Problem:   Maternal sickle cell anemia affecting pregnancy, antepartum ([name]) Active Problems:   Sickle cell disease ([name])   Herpes simplex type 2 (HSV-2) infection affecting pregnancy, antepartum   Positive GBS test   Sickle cell pain crisis ([place])  1. Hb SS with crisis: Continue PCA and clinician assisted Dilaudid at current doses. Patient eating and drinking well so will continue IV fluids and a decreased dose.  2. Leukocytosis: No evidence of infection. The leukocytosis is likely associated with the inflammatory state associated with her crisis. 3. Anemia of chronic disease: The patient has a hemoglobin of 7.9  which is essentially at the target hemoglobin of 8 recommended in a pregnant patient with sickle cell. Would not transfuse at this time. 4. Chronic pain: Continue methadone 5 mg daily at bedtime. 5. Third trimester pregnancy: Patient is 27 + weeks gestation and is to be induced to morrow. Will defer to Ob-Gyn.   Code Status: Full Code Family Communication: N/A Disposition Plan: Not yet ready for discharge  [place].  Pager [phone]. If 7PM-7AM, please contact night-coverage.  10/29/2015, 4:50 PM  LOS: 2 days   Interim History: Patient reports that she feels a little bit better today. She rates her pain as 7-8/10 and localized to left shoulder and upper back.    Procedures:  None   Antibiotics:  None   Objective: Filed Vitals:   10/29/15 1310 10/29/15 1327 10/29/15 1424 10/29/15 1510  BP:    113/58  Pulse:    95  Temp:    98.4 F (36.9 C)  TempSrc:    Oral  Resp:   15 15  Height:      Weight:      SpO2: 93% 93%  95%   Weight change: 5 lb 12.1 oz (2.61 kg)  Intake/Output Summary (Last 24 hours) at 10/29/15 1650 Last data filed at 10/29/15 1215  Gross per 24 hour  Intake   1771 ml  Output    5950 ml  Net  -4179 ml       General: Alert, awake, oriented x3, in no apparent distress.  HEENT: [place]/AT PEERL, EOMI, anicteric Neck: Trachea midline,  no masses, no thyromegal,y no JVD, no carotid bruit OROPHARYNX:  Moist, No exudate/ erythema/lesions.  Heart: Regular rate and rhythm, without murmurs, rubs, gallops, PMI non-displaced, no heaves or thrills on palpation.  Lungs: Clear to auscultation, no wheezing or rhonchi noted. No increased vocal fremitus resonant to percussion  Abdomen: Gravid Neuro: No focal neurological deficits noted cranial nerves II through XII grossly intact. Strength  in bilateral upper and lower extremities. Musculoskeletal: No warm swelling or erythema around joints, no spinal tenderness noted. Psychiatric: Patient alert and oriented x3, good insight and cognition, good recent to remote recall.    Data Reviewed: Basic Metabolic Panel:  Recent Labs Lab 10/27/15 0015 10/28/15 1320  NA  --  132*  K  --  4.7  CL  --  106  CO2  --  23  GLUCOSE  --  401*  BUN  --  <5*  CREATININE 0.46 0.58  CALCIUM  --  8.0*   Liver Function Tests:  Recent Labs Lab 10/28/15 1320  AST 29  ALT 47  ALKPHOS 138*  BILITOT 2.3*  PROT 5.7*  ALBUMIN 2.9*   No results for input(s): LIPASE, AMYLASE in the last 168 hours. No results for  input(s): AMMONIA in the last 168 hours. CBC:  Recent Labs Lab 10/26/15 1700 10/28/15 1320  WBC 17.5* 15.1*  NEUTROABS 14.0* 11.4*  HGB 9.6* 7.9*  HCT 27.3* 23.0*  MCV 86.9 88.5  PLT 459* 358   Cardiac Enzymes: No results for input(s): CKTOTAL, CKMB, CKMBINDEX, TROPONINI in the last 168 hours. BNP (last 3 results) No results for input(s): BNP in the last 8760 hours.  ProBNP (last 3 results) No results for input(s): PROBNP in the last 8760 hours.  CBG:  Recent Labs Lab 10/29/15 0849  GLUCAP 102*    No results found for this or any previous visit (from the past 240 hour(s)).   Studies: Ir Fluoro Guide Cv Line  Right  10/16/2015  CLINICAL DATA:  Sickle cell anemia with crisis during pregnancy. Poor IV access and need for PICC line. EXAM: POWER PICC LINE PLACEMENT WITH ULTRASOUND AND FLUOROSCOPIC GUIDANCE FLUOROSCOPY TIME:  54 seconds. PROCEDURE: The patient was advised of the possible risks and complications and agreed to undergo the procedure. The patient was then brought to the angiographic suite for the procedure. The right arm was prepped with chlorhexidine, draped in the usual sterile fashion using maximum barrier technique (cap and mask, sterile gown, sterile gloves, large sterile sheet, hand hygiene and cutaneous antisepsis) and infiltrated locally with 1% Lidocaine. Ultrasound demonstrated patency of the right brachial vein, and this was documented with an image. Under real-time ultrasound guidance, this vein was accessed with a 21 gauge micropuncture needle and image documentation was performed. A 0.018 wire was introduced in to the vein. Over this, a 5.0 [place] dual lumen power injectable PICC was advanced to the lower SVC/right atrial junction. Fluoroscopy during the procedure and fluoro spot radiograph confirms appropriate catheter position. The catheter was flushed and covered with a sterile dressing. Catheter length: 33 cm COMPLICATIONS: None IMPRESSION: Successful right arm power injectable PICC line placement with ultrasound and fluoroscopic guidance. The catheter is ready for use. Electronically Signed   By: Aletta Edouard M.D.   On: 10/16/2015 15:43   Ir US Guide Vasc Access Right  10/16/2015  CLINICAL DATA:  Sickle cell anemia with crisis during pregnancy. Poor IV access and need for PICC line. EXAM: POWER PICC LINE PLACEMENT WITH ULTRASOUND AND FLUOROSCOPIC GUIDANCE FLUOROSCOPY TIME:  54 seconds. PROCEDURE: The patient was advised of the possible risks and complications and agreed to undergo the procedure. The patient was then brought to the angiographic suite for the procedure. The right arm was  prepped with chlorhexidine, draped in the usual sterile fashion using maximum barrier technique (cap and mask, sterile gown, sterile gloves, large sterile sheet, hand hygiene and cutaneous antisepsis) and infiltrated locally with 1% Lidocaine. Ultrasound demonstrated patency of the right brachial vein, and this was documented with an image. Under real-time ultrasound guidance, this vein was accessed with a 21 gauge micropuncture needle and image documentation was performed. A 0.018 wire was introduced in to the vein. Over this, a 5.0 [place] dual lumen power injectable PICC was advanced to the lower SVC/right atrial junction. Fluoroscopy during the procedure and fluoro spot radiograph confirms appropriate catheter position. The catheter was flushed and covered with a sterile dressing. Catheter length: 33 cm COMPLICATIONS: None IMPRESSION: Successful right arm power injectable PICC line placement with ultrasound and fluoroscopic guidance. The catheter is ready for use. Electronically Signed   By: Aletta Edouard M.D.   On: 10/16/2015 15:43   [place] Mfm Fetal Bpp Wo Non Stress  10/29/2015  OBSTETRICAL ULTRASOUND: This  exam was performed within a [facility] Ultrasound Department. The OB US report was generated in the AS system, and faxed to the ordering physician.  This report is available in the [facility]. See the AS Obstetric US report via the Image Link.  [place] Mfm Fetal Bpp Wo Non Stress  10/20/2015  OBSTETRICAL ULTRASOUND: This exam was performed within a [facility]trasound Department. The OB US report was generated in the AS system, and faxed to the ordering physician.  This report is available in the [facility]. See the AS Obstetric US report via the Image Link.  [place] Mfm Fetal Bpp Wo Non Stress  10/13/2015  OBSTETRICAL ULTRASOUND: This exam was performed within a [place] Ultrasound Department. The OB US report was generated in the AS system, and faxed to the ordering physician.  This report is  available in the [facility]. See the AS Obstetric US report via the Image Link.  [place] Mfm Fetal Bpp Wo Non Stress  10/06/2015  OBSTETRICAL ULTRASOUND: This exam was performed within a [facility] Ultrasound Department. The OB US report was generated in the AS system, and faxed to the ordering physician.  This report is available in the [facility]. See the AS Obstetric US report via the Image Link.  [place] Mfm Ob Follow Up  10/13/2015  OBSTETRICAL ULTRASOUND: This exam was performed within a  Ultrasound Department. The OB US report was generated in the AS system, and faxed to the ordering physician.  This report is available in the [facility]. See the AS Obstetric US report via the Image Link.  [place] Mfm Ob Limited  10/27/2015  OBSTETRICAL ULTRASOUND: This exam was performed within a  Ultrasound Department. The OB US report was generated in the AS system, and faxed to the ordering physician.  This report is available in the [facility]. See the AS Obstetric US report via the Image Link.   Scheduled Meds: . docusate sodium  100 mg Oral Daily  . enoxaparin (LOVENOX) injection  40 mg Subcutaneous Q24H  . folic acid  1 mg Oral Daily  . HYDROmorphone   Intravenous Q4H  . methadone  5 mg Oral Daily  . prenatal multivitamin  1 tablet Oral Q1200  . valACYclovir  500 mg Oral BID   Continuous Infusions: . dextrose 5 % and 0.45 % NaCl with KCl 10 mEq/L 150 mL/hr at 10/29/15 1145    Time spent 25 minutes.

## 2015-10-29 NOTE — Progress Notes (Signed)
FACULTY PRACTICE ANTEPARTUM(COMPREHENSIVE) NOTE  [name] is a 25 y.o. G3P1011 at [redacted]w[redacted]d by early ultrasound who is admitted for sickle cell crisis.   Fetal presentation is cephalic. Length of Stay:  2  Days  Subjective: Pain is improved, 5/10 compared to admission 10/10. Patient reports the fetal movement as active. Patient reports uterine contraction  activity as occasional. Patient reports  vaginal bleeding as none. Patient describes fluid per vagina as None.  Vitals:  Blood pressure 100/58, pulse 102, temperature 99 F (37.2 C), temperature source Oral, resp. rate 15, height 5\' 1"  (1.549 m), weight 185 lb 0.1 oz (83.918 kg), last menstrual period 01/24/2015, SpO2 97 %, not currently breastfeeding. Physical Examination:  General appearance - alert, well appearing, and in no distress Heart - normal rate and regular rhythm Abdomen - soft, nontender, nondistended Fundal Height:  size equals dates Cervical Exam: Not evaluated.  Extremities: extremities normal, atraumatic, no cyanosis or edema and Homans sign is negative, no sign of DVT Membranes:intact  FHR 150 baseline reactive  Labs:  Results for orders placed or performed during the hospital encounter of 10/26/15 (from the past 24 hour(s))  CBC with Differential/Platelet   Collection Time: 10/28/15  1:20 PM  Result Value Ref Range   WBC 15.1 (H) 4.0 - 10.5 K/uL   RBC 2.60 (L) 3.87 - 5.11 MIL/uL   Hemoglobin 7.9 (L) 12.0 - 15.0 g/dL   HCT 23.0 (L) 36.0 - 46.0 %   MCV 88.5 78.0 - 100.0 fL   MCH 30.4 26.0 - 34.0 pg   MCHC 34.3 30.0 - 36.0 g/dL   RDW 18.8 (H) 11.5 - 15.5 %   Platelets 358 150 - 400 K/uL   Neutrophils Relative % 76 %   Lymphocytes Relative 13 %   Monocytes Relative 9 %   Eosinophils Relative 2 %   Basophils Relative 0 %   Other 0 %   Neutro Abs 11.4 (H) 1.7 - 7.7 K/uL   Lymphs Abs 2.0 0.7 - 4.0 K/uL   Monocytes Absolute 1.4 (H) 0.1 - 1.0 K/uL   Eosinophils Absolute 0.3 0.0 - 0.7 K/uL   Basophils  Absolute 0.0 0.0 - 0.1 K/uL   RBC Morphology SICKLE CELLS    Smear Review LARGE PLATELETS PRESENT   Comprehensive metabolic panel   Collection Time: 10/28/15  1:20 PM  Result Value Ref Range   Sodium 132 (L) 135 - 145 mmol/L   Potassium 4.7 3.5 - 5.1 mmol/L   Chloride 106 101 - 111 mmol/L   CO2 23 22 - 32 mmol/L   Glucose, Bld 401 (H) 65 - 99 mg/dL   BUN <5 (L) 6 - 20 mg/dL   Creatinine, Ser 0.58 0.44 - 1.00 mg/dL   Calcium 8.0 (L) 8.9 - 10.3 mg/dL   Total Protein 5.7 (L) 6.5 - 8.1 g/dL   Albumin 2.9 (L) 3.5 - 5.0 g/dL   AST 29 15 - 41 U/L   ALT 47 14 - 54 U/L   Alkaline Phosphatase 138 (H) 38 - 126 U/L   Total Bilirubin 2.3 (H) 0.3 - 1.2 mg/dL   GFR calc non Af Amer >60 >60 mL/min   GFR calc Af Amer >60 >60 mL/min   Anion gap 3 (L) 5 - 15  Glucose, capillary   Collection Time: 10/29/15  8:49 AM  Result Value Ref Range   Glucose-Capillary 102 (H) 65 - 99 mg/dL   *Note: Due to a large number of results and/or encounters for the requested  time period, some results have not been displayed. A complete set of results can be found in Results Review.    Imaging Studies:     Currently EPIC will not allow sonographic studies to automatically populate into notes.  In the meantime, copy and paste results into note or free text.  Medications:  Scheduled . docusate sodium  100 mg Oral Daily  . enoxaparin (LOVENOX) injection  40 mg Subcutaneous Q24H  . folic acid  1 mg Oral Daily  . HYDROmorphone   Intravenous Q4H  . methadone  5 mg Oral Daily  . prenatal multivitamin  1 tablet Oral Q1200  . valACYclovir  500 mg Oral BID   I have reviewed the patient's current medications.  ASSESSMENT: Patient Active Problem List   Diagnosis Date Noted  . [redacted] weeks gestation of pregnancy   . Sickle cell pain crisis ([place]) 10/26/2015  . Positive GBS test 10/18/2015  . Narcotic dependence ([place]) 10/06/2015  . 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 High risk for intrapartum complications, antepartum 08/12/2015  . Chronic pain 08/04/2015  . Cocaine use 07/24/2015  . Maternal sickle cell anemia affecting pregnancy, antepartum ([place]) 07/14/2015  . Anemia of chronic disease   . Migraines 11/08/2011  . GERD (gastroesophageal reflux disease) 02/17/2011  . Depression 01/06/2011  . Sickle cell disease ([place]) 01/08/2009  . 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 Medicine is following IOL tomorrow as scheduled  ANYANWU,UGONNA A, MD 10/29/2015,10:43 AM

## 2015-10-29 NOTE — Progress Notes (Signed)
Lab stated they call if BLOOD SUGAR IS 550 critical  Value

## 2015-10-29 NOTE — Progress Notes (Signed)
Dr Harolyn Rutherford notified of blood  Sugar of 401 yesterday and  Finger stick of 102 today no new orders today  Will see pt later  This am  Lab called  To check on notification time of BS  And diabectic   Nurse    Called to verify   [facility]

## 2015-10-30 ENCOUNTER — Other Ambulatory Visit: Payer: Self-pay | Admitting: Internal Medicine

## 2015-10-30 ENCOUNTER — Encounter (HOSPITAL_COMMUNITY): Payer: Self-pay | Admitting: Anesthesiology

## 2015-10-30 ENCOUNTER — Inpatient Hospital Stay (HOSPITAL_COMMUNITY): Admission: RE | Admit: 2015-10-30 | Payer: Medicare Other | Source: Ambulatory Visit

## 2015-10-30 ENCOUNTER — Inpatient Hospital Stay (HOSPITAL_COMMUNITY): Payer: Medicare Other | Admitting: Anesthesiology

## 2015-10-30 DIAGNOSIS — O99019 Anemia complicating pregnancy, unspecified trimester: Secondary | ICD-10-CM | POA: Diagnosis not present

## 2015-10-30 DIAGNOSIS — Z3A39 39 weeks gestation of pregnancy: Secondary | ICD-10-CM | POA: Diagnosis not present

## 2015-10-30 DIAGNOSIS — D571 Sickle-cell disease without crisis: Secondary | ICD-10-CM | POA: Diagnosis not present

## 2015-10-30 DIAGNOSIS — O9902 Anemia complicating childbirth: Secondary | ICD-10-CM | POA: Diagnosis not present

## 2015-10-30 DIAGNOSIS — O321XX0 Maternal care for breech presentation, not applicable or unspecified: Secondary | ICD-10-CM

## 2015-10-30 DIAGNOSIS — D5700 Hb-SS disease with crisis, unspecified: Secondary | ICD-10-CM | POA: Diagnosis not present

## 2015-10-30 LAB — LACTATE DEHYDROGENASE: LDH: 194 U/L — ABNORMAL HIGH (ref 98–192)

## 2015-10-30 LAB — CBC WITH DIFFERENTIAL/PLATELET
BASOS ABS: 0 10*3/uL (ref 0.0–0.1)
Basophils Relative: 0 %
EOS PCT: 2 %
Eosinophils Absolute: 0.3 10*3/uL (ref 0.0–0.7)
HEMATOCRIT: 24.7 % — AB (ref 36.0–46.0)
HEMOGLOBIN: 8.5 g/dL — AB (ref 12.0–15.0)
LYMPHS PCT: 14 %
Lymphs Abs: 2.2 10*3/uL (ref 0.7–4.0)
MCH: 30.5 pg (ref 26.0–34.0)
MCHC: 34.4 g/dL (ref 30.0–36.0)
MCV: 88.5 fL (ref 78.0–100.0)
Monocytes Absolute: 0.9 10*3/uL (ref 0.1–1.0)
Monocytes Relative: 6 %
NEUTROS ABS: 12.2 10*3/uL — AB (ref 1.7–7.7)
NEUTROS PCT: 78 %
PLATELETS: 401 10*3/uL — AB (ref 150–400)
RBC: 2.79 MIL/uL — AB (ref 3.87–5.11)
RDW: 19.7 % — ABNORMAL HIGH (ref 11.5–15.5)
WBC: 15.5 10*3/uL — AB (ref 4.0–10.5)

## 2015-10-30 LAB — RAPID HIV SCREEN (HIV 1/2 AB+AG)
HIV 1/2 Antibodies: NONREACTIVE
HIV-1 P24 ANTIGEN - HIV24: NONREACTIVE

## 2015-10-30 LAB — BASIC METABOLIC PANEL
ANION GAP: 6 (ref 5–15)
BUN: <5 mg/dL — ABNORMAL LOW (ref 6–20)
CO2: 25 mmol/L (ref 22–32)
Calcium: 8.7 mg/dL — ABNORMAL LOW (ref 8.9–10.3)
Chloride: 103 mmol/L (ref 101–111)
Creatinine, Ser: 0.35 mg/dL — ABNORMAL LOW (ref 0.44–1.00)
Glucose, Bld: 83 mg/dL (ref 65–99)
POTASSIUM: 4 mmol/L (ref 3.5–5.1)
SODIUM: 134 mmol/L — AB (ref 135–145)

## 2015-10-30 MED ORDER — LACTATED RINGERS IV SOLN
INTRAVENOUS | Status: DC
  Administered 2015-10-30 – 2015-10-31 (×3): via INTRAVENOUS

## 2015-10-30 MED ORDER — LACTATED RINGERS IV SOLN: 500.0000 mL | INTRAVENOUS | Status: DC | PRN

## 2015-10-30 MED ORDER — SODIUM CHLORIDE 0.9% FLUSH: 9.0000 mL | INTRAVENOUS | Status: DC | PRN

## 2015-10-30 MED ORDER — DIPHENHYDRAMINE HCL 12.5 MG/5ML PO ELIX
12.5000 mg | ORAL_SOLUTION | Freq: Four times a day (QID) | ORAL | Status: DC | PRN
Start: 2015-10-30 — End: 2015-10-31
  Filled 2015-10-30: qty 5

## 2015-10-30 MED ORDER — PHENYLEPHRINE 40 MCG/ML (10ML) SYRINGE FOR IV PUSH (FOR BLOOD PRESSURE SUPPORT): 80.0000 ug | PREFILLED_SYRINGE | INTRAVENOUS | Status: DC | PRN

## 2015-10-30 MED ORDER — ONDANSETRON HCL 4 MG/2ML IJ SOLN: 4.0000 mg | Freq: Four times a day (QID) | INTRAMUSCULAR | Status: DC | PRN

## 2015-10-30 MED ORDER — HYDROMORPHONE 1 MG/ML IV SOLN: INTRAVENOUS | Status: DC

## 2015-10-30 MED ORDER — PHENYLEPHRINE 40 MCG/ML (10ML) SYRINGE FOR IV PUSH (FOR BLOOD PRESSURE SUPPORT)
80.0000 ug | PREFILLED_SYRINGE | INTRAVENOUS | Status: DC | PRN
  Filled 2015-10-30: qty 10

## 2015-10-30 MED ORDER — OXYTOCIN 40 UNITS IN LACTATED RINGERS INFUSION - SIMPLE MED
2.5000 [IU]/h | INTRAVENOUS | Status: DC
  Administered 2015-10-31: 39.96 [IU]/h via INTRAVENOUS
  Filled 2015-10-30: qty 1000

## 2015-10-30 MED ORDER — DIPHENHYDRAMINE HCL 12.5 MG/5ML PO ELIX
12.5000 mg | ORAL_SOLUTION | Freq: Four times a day (QID) | ORAL | Status: DC | PRN
  Filled 2015-10-30: qty 5

## 2015-10-30 MED ORDER — FLEET ENEMA 7-19 GM/118ML RE ENEM: 1.0000 | ENEMA | Freq: Every day | RECTAL | Status: DC | PRN

## 2015-10-30 MED ORDER — PENICILLIN G POTASSIUM 5000000 UNITS IJ SOLR
5.0000 10*6.[IU] | Freq: Once | INTRAVENOUS | Status: AC
  Administered 2015-10-31: 5 10*6.[IU] via INTRAVENOUS
  Filled 2015-10-30: qty 5

## 2015-10-30 MED ORDER — LACTATED RINGERS IV SOLN: 500.0000 mL | Freq: Once | INTRAVENOUS | Status: DC

## 2015-10-30 MED ORDER — OXYCODONE-ACETAMINOPHEN 5-325 MG PO TABS: 2.0000 | ORAL_TABLET | ORAL | Status: DC | PRN

## 2015-10-30 MED ORDER — NALOXONE HCL 0.4 MG/ML IJ SOLN: 0.4000 mg | INTRAMUSCULAR | Status: DC | PRN

## 2015-10-30 MED ORDER — LACTATED RINGERS IV SOLN
500.0000 mL | Freq: Once | INTRAVENOUS | Status: AC
  Administered 2015-10-30: 500 mL via INTRAVENOUS

## 2015-10-30 MED ORDER — MISOPROSTOL 50MCG HALF TABLET
50.0000 ug | ORAL_TABLET | Freq: Once | ORAL | Status: AC
  Administered 2015-10-30: 50 ug via ORAL
  Filled 2015-10-30: qty 0.5

## 2015-10-30 MED ORDER — LIDOCAINE HCL (PF) 1 % IJ SOLN: 30.0000 mL | INTRAMUSCULAR | Status: DC | PRN

## 2015-10-30 MED ORDER — EPHEDRINE 5 MG/ML INJ: 10.0000 mg | INTRAVENOUS | Status: DC | PRN

## 2015-10-30 MED ORDER — OXYCODONE-ACETAMINOPHEN 5-325 MG PO TABS: 1.0000 | ORAL_TABLET | ORAL | Status: DC | PRN

## 2015-10-30 MED ORDER — DIPHENHYDRAMINE HCL 50 MG/ML IJ SOLN
12.5000 mg | INTRAMUSCULAR | Status: DC | PRN
  Administered 2015-10-30: 12.5 mg via INTRAVENOUS
  Filled 2015-10-30: qty 1

## 2015-10-30 MED ORDER — OXYTOCIN BOLUS FROM INFUSION: 500.0000 mL | INTRAVENOUS | Status: DC

## 2015-10-30 MED ORDER — DIPHENHYDRAMINE HCL 50 MG/ML IJ SOLN: 12.5000 mg | Freq: Four times a day (QID) | INTRAMUSCULAR | Status: DC | PRN

## 2015-10-30 MED ORDER — MISOPROSTOL 25 MCG QUARTER TABLET
25.0000 ug | ORAL_TABLET | ORAL | Status: DC | PRN
  Administered 2015-10-30: 25 ug via VAGINAL
  Filled 2015-10-30: qty 0.25

## 2015-10-30 MED ORDER — FENTANYL 2.5 MCG/ML BUPIVACAINE 1/10 % EPIDURAL INFUSION (WH - ANES)
14.0000 mL/h | INTRAMUSCULAR | Status: DC | PRN
  Administered 2015-10-31: 14 mL/h via EPIDURAL
  Administered 2015-10-31: 10 mL/h via EPIDURAL
  Administered 2015-10-31: 14 mL/h via EPIDURAL
  Filled 2015-10-30 (×2): qty 125

## 2015-10-30 MED ORDER — LACTATED RINGERS IV SOLN
INTRAVENOUS | Status: DC
  Administered 2015-10-30: 12:00:00 via INTRAVENOUS

## 2015-10-30 MED ORDER — VALACYCLOVIR HCL 500 MG PO TABS: 500.0000 mg | ORAL_TABLET | Freq: Two times a day (BID) | ORAL | Status: DC

## 2015-10-30 MED ORDER — PENICILLIN G POTASSIUM 5000000 UNITS IJ SOLR
2.5000 10*6.[IU] | INTRAMUSCULAR | Status: DC
  Administered 2015-10-31 (×3): 2.5 10*6.[IU] via INTRAVENOUS
  Filled 2015-10-30 (×5): qty 2.5

## 2015-10-30 MED ORDER — FENTANYL CITRATE (PF) 100 MCG/2ML IJ SOLN: 50.0000 ug | INTRAMUSCULAR | Status: DC | PRN

## 2015-10-30 MED ORDER — HYDROMORPHONE 1 MG/ML IV SOLN
INTRAVENOUS | Status: DC
  Administered 2015-10-30: 18:00:00 via INTRAVENOUS

## 2015-10-30 MED ORDER — HYDROMORPHONE 1 MG/ML IV SOLN
INTRAVENOUS | Status: DC
  Administered 2015-10-30: 2.8 mg via INTRAVENOUS
  Administered 2015-10-30: 6.3 mg via INTRAVENOUS

## 2015-10-30 MED ORDER — FENTANYL 2.5 MCG/ML BUPIVACAINE 1/10 % EPIDURAL INFUSION (WH - ANES)
14.0000 mL/h | INTRAMUSCULAR | Status: DC | PRN
  Administered 2015-10-30: 14 mL/h via EPIDURAL
  Filled 2015-10-30: qty 125

## 2015-10-30 MED ORDER — SOD CITRATE-CITRIC ACID 500-334 MG/5ML PO SOLN
30.0000 mL | ORAL | Status: DC | PRN
  Filled 2015-10-30: qty 15

## 2015-10-30 MED ORDER — ACETAMINOPHEN 325 MG PO TABS: 650.0000 mg | ORAL_TABLET | ORAL | Status: DC | PRN

## 2015-10-30 MED ORDER — FAMOTIDINE IN NACL 20-0.9 MG/50ML-% IV SOLN
20.0000 mg | Freq: Once | INTRAVENOUS | Status: AC
Start: 2015-10-30 — End: 2015-10-30
  Administered 2015-10-30: 20 mg via INTRAVENOUS
  Filled 2015-10-30: qty 50

## 2015-10-30 MED ORDER — TERBUTALINE SULFATE 1 MG/ML IJ SOLN
0.2500 mg | INTRAMUSCULAR | Status: AC
  Administered 2015-10-30 (×2): 0.25 mg via SUBCUTANEOUS
  Filled 2015-10-30: qty 1

## 2015-10-30 MED ORDER — LIDOCAINE HCL (PF) 1 % IJ SOLN
INTRAMUSCULAR | Status: DC | PRN
  Administered 2015-10-30 (×2): 6 mL

## 2015-10-30 NOTE — Progress Notes (Signed)
Dr. In and visisted. Talked to pt about being induced today. Pt  Was transferred to room 171 at 0810a.m.. Stable. No noted distress.

## 2015-10-30 NOTE — Progress Notes (Signed)
PCA restarted with total dose given from syringe of 6.3mg , leaving 24.53ml in syringe for restart

## 2015-10-30 NOTE — Progress Notes (Signed)
Patient ID: [name], female   DOB: 19-Dec-1990, 25 y.o.   MRN: [id number] [place] COMPREHENSIVE PROGRESS NOTE  [name] is a 25 y.o. G3P1011 at [redacted]w[redacted]d  who is admitted for Sickle Cell crises.   Fetal presentation is breech. Length of Stay:  3  Days  Subjective: Pt denies new sx. She is upset about her version and IOL being delayed due to Lovenox not being held.  She reports + FM , No ctx, no LOF and no VB     Vitals:  Blood pressure 124/81, pulse 111, temperature 98.4 F (36.9 C), temperature source Oral, resp. rate 20, height 5\' 1"  (1.549 m), weight 188 lb (85.276 kg), last menstrual period 01/24/2015, SpO2 94 %, not currently breastfeeding. Physical Examination: General appearance - alert, well appearing, and in no distress Bedside sono: pt with continued breech presentation Cervical Exam: Not evaluated.  Membranes:intact  Fetal Monitoring:  Baseline: 150's bpm, Variability: Good {> 6 bpm), Accelerations: Reactive and TOCO: no ctx  Labs:  No results found. However, due to the size of the patient record, not all encounters were searched. Please check Results Review for a complete set of results.  Imaging Studies:    10/27/2015 at MFM U spt was vertex with AFI of 24  10/29/2015 at MFM breech/oblique with AFI 25.8  Medications:  Scheduled . docusate sodium  100 mg Oral Daily  . folic acid  1 mg Oral Daily  . HYDROmorphone   Intravenous Q4H  . methadone  5 mg Oral Daily  . prenatal multivitamin  1 tablet Oral Q1200  . valACYclovir  500 mg Oral BID  Dilaudid PCA   I have reviewed the patient's current medications.  ASSESSMENT: Patient Active Problem List   Diagnosis Date Noted  . Hb-SS disease with crisis ([place])   . Sickle cell pain crisis ([place]) 10/26/2015  . Positive GBS test 10/18/2015  . Narcotic dependence ([place]) 10/06/2015  . 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 High risk for intrapartum complications, antepartum 08/12/2015  . Chronic pain 08/04/2015  . Cocaine use 07/24/2015  . Maternal sickle cell anemia affecting pregnancy, antepartum ([place]) 07/14/2015  . Anemia of chronic disease   . Migraines 11/08/2011  . GERD (gastroesophageal reflux disease) 02/17/2011  . Depression 01/06/2011  . Sickle cell disease ([place]) 01/08/2009  . 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  I have explained to her that her version and IOL would need to be postponed by 24 hours.  Pt was upset but, her family expressed understanding for the delay.    Pt is scheduled to be transferred back to the Women's unit today with transfer back to L&D at 0600 for NST with epidural followed by a ECV and IOL.  Have scheduled this with L&D and reviewed this with the pt and her family.    Hold Lovenox NPO afer MN. Cont prev ante care Continue routine antenatal care.   [name], Talyah Seder 10/30/2015,9:59 AM

## 2015-10-30 NOTE — Anesthesia Preprocedure Evaluation (Addendum)
Anesthesia Evaluation  Patient identified by MRN, date of birth, ID band Patient awake  General Assessment Comment:Patient Active Problem List  Diagnosis Date Noted . Sickle cell pain crisis ([place]) 10/26/2015 . Positive GBS test 10/18/2015 . Narcotic dependence ([place]) 10/06/2015 . Herpes simplex type 2 (HSV-2) infection affecting pregnancy, antepartum 09/26/2015 . Maternal substance abuse, antepartum 09/26/2015 . High risk for intrapartum complications, antepartum 08/12/2015 . Chronic pain 08/04/2015 . Cocaine use 07/24/2015 . Maternal sickle cell anemia affecting pregnancy, antepartum ([place]) 07/14/2015 . Anemia of chronic disease  . Migraines 11/08/2011 . GERD (gastroesophageal reflux disease) 02/17/2011 . Depression 01/06/2011 . Sickle cell disease ([place]) 01/08/2009 . 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 . Depression 01/06/2011 . GERD (gastroesophageal reflux disease) 02/17/2011 . Trichotillomania    h/o . Blood transfusion    "lots" . Sickle cell anemia with crisis ([place])  . Exertional dyspnea    "sometimes" . Migraines 11/08/11   "@ least twice/month" . Chronic back pain    "very severe; have knot in my back; from tight muscle; take RX and exercise for it" . Mood swings ([place]) 11/08/11   "I go back and forth; real bad" . Sickle cell anemia ([name])  . Blood transfusion without reported diagnosis  . Genital HSV          Reviewed: Allergy & Precautions, NPO status , Patient's Chart, lab work & pertinent test results  Airway Mallampati: II  TM Distance: >3 FB Neck ROM: Full    Dental no notable dental hx.    Pulmonary shortness of breath, former smoker,    Pulmonary exam normal breath sounds clear to auscultation       Cardiovascular negative cardio ROS Normal cardiovascular exam Rhythm:Regular Rate:Normal     Neuro/Psych  Headaches, PSYCHIATRIC DISORDERS Depression    GI/Hepatic Neg liver ROS, GERD  Medicated,  Endo/Other  negative endocrine ROS  Renal/GU negative Renal ROS  negative genitourinary   Musculoskeletal negative musculoskeletal ROS (+)   Abdominal (+) + obese,   Peds negative pediatric ROS (+)  Hematology  (+) Sickle cell anemia and anemia ,   Anesthesia Other Findings   Reproductive/Obstetrics negative OB ROS                            Anesthesia Physical Anesthesia Plan  ASA: II  Anesthesia Plan: Epidural   Post-op Pain Management:    Induction: Intravenous  Airway Management Planned: Natural Airway  Additional Equipment:   Intra-op Plan:   Post-operative Plan:   Informed Consent: I have reviewed the patients History and Physical, chart, labs and discussed the procedure including the risks, benefits and alternatives for the proposed anesthesia with the patient or authorized representative who has indicated his/her understanding and acceptance.   Dental advisory given  Plan Discussed with: CRNA  Anesthesia Plan Comments: (Informed consent obtained prior to proceeding including risk of failure, 1% risk of PDPH, risk of minor discomfort and bruising.  Discussed rare but serious complications including epidural abscess, permanent nerve injury, epidural hematoma.  Discussed alternatives to epidural analgesia and patient desires to proceed.  Timeout performed pre-procedure verifying patient name, procedure, and platelet count.  Patient tolerated procedure well.   Received call from Dr. Ihor Dow about 1300 today regarding plan of care. Ms. Carver presents with sickle cell anemia with chronic pain issues and lovenox therapy.  She is a breech presentation and plan is for attempt  at version and followed by vaginal delivery. Last lovenox  dose was 2208 last night. Her dose is 40 mg SQ once per day.  Per ASRA guidelines, neuraxial block may be placed 10-12 hours after last dose. Now at 16 hours after last dose. Platelets 406K.)       Anesthesia Quick Evaluation

## 2015-10-30 NOTE — Consult Note (Signed)
[place] 10/30/2015    10:00 PM  Neonatal Medicine Consultation         [name]          MRN:  [id number]  I was called at the request of the patient's obstetrician (Dr. 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  The patient's prenatal course includes sickle cell disease.  She is at term and is being induced.  She is admitted to L&D, and is receiving treatment that includes her usual outpatient narcotic (Methadone 5 mg daily) as well as a PCA pump with Dilaudid.  As an outpatient, she has also been getting oxycodone 15 mg every 4 hours prn pain.  The baby is a girl.  Mom delivered a boy about 10 months ago that was admitted for about 3 weeks in our NICU due to NAS, so mom knows what to expect. I reviewed what is likely to happen following delivery (NAS symptoms), however not all babies demonstrate withdrawal symptoms that lead to NICU admission and pharmacological management.  We would want to observe the baby in the hospital for at least 5 days, allowing enough time for withdrawal symptoms to appear.    _____________________ Electronically Signed By: Roosevelt Locks, MD Neonatologist

## 2015-10-30 NOTE — Anesthesia Procedure Notes (Signed)
Epidural Patient location during procedure: OB  Staffing Anesthesiologist: Franne Grip  Preanesthetic Checklist Completed: patient identified, site marked, surgical consent, pre-op evaluation, timeout performed, IV checked, risks and benefits discussed and monitors and equipment checked  Epidural Patient position: sitting Prep: DuraPrep Patient monitoring: heart rate and blood pressure Approach: midline Location: L3-L4 Injection technique: LOR saline  Needle:  Needle type: Tuohy  Needle gauge: 17 G Needle length: 9 cm Needle insertion depth: 5 cm Catheter type: closed end flexible Catheter size: 19 Gauge Catheter at skin depth: 11 cm Test dose: negative and Other  Assessment Events: blood not aspirated, injection not painful, no injection resistance, negative IV test and no paresthesia  Additional Notes Non latex gloves used. Tolerated well. CO2 monitoring in place. Discussed with RN need to observe for sedation.Reason for block:procedure for pain

## 2015-10-30 NOTE — Progress Notes (Signed)
Dr [name] with anesthesia at bedside to reduce rate of epidural to 56ml/hr with no pca at this time. PCA restarted with PCA doses reduced per dr Newman Pies request.

## 2015-10-30 NOTE — Progress Notes (Signed)
Pt was lying in bed when [facility] arrived. Her mother and aunt were bedside. She said she had just woke up. She wants baby to come on. Otherwise pt said she is fine. Her mother and aunt joined [place] in a circle for prayer. Pt encouraged to ask if additional support is needed.  Chaplain Ernest Haber, M.Div.   10/30/15 2000  Clinical Encounter Type  Visited With Patient and family together

## 2015-10-30 NOTE — Progress Notes (Signed)
Patient ID: [name], female   DOB: Feb 08, 1991, 25 y.o.   MRN: [id number] After informed verbal consent, Terbutaline 0.25 mg SQ x 2 given, ECV was attempted under Ultrasound guidance. Successful conversion to vertex presentation.    FHR was reactive before and after the procedure.   Pt. Tolerated the procedure well.  She slept thorough most of the procedure.  Cervix 1.5cm/50%/vertex/ballotable  Cytotec 32mcg po orally for cervical ripening. PCN when ROM or reg ctx. Will adjust PCA    Vernel Langenderfer L. [name], M.D., Cherlynn June

## 2015-10-30 NOTE — Telephone Encounter (Signed)
Refill request for oxycodone. LOV 09/22/2015. Please advise. Thanks!

## 2015-10-30 NOTE — Progress Notes (Signed)
SICKLE CELL SERVICE PROGRESS NOTE  [name] [id number] DOB: 12/27/1990 DOA: 10/26/2015 PCP: Angelica Chessman, MD  Assessment/Plan: Principal Problem:   Maternal sickle cell anemia affecting pregnancy, antepartum ([name]) Active Problems:   Sickle cell disease ([name])   Herpes simplex type 2 (HSV-2) infection affecting pregnancy, antepartum   Positive GBS test   Sickle cell pain crisis ([place])   Hb-SS disease with crisis ([place])  1. Hb SS with crisis: Continue PCA and clinician assisted Dilaudid at current doses. Patient eating and drinking well so will continue IV fluids and a decreased dose.  2. Leukocytosis: No evidence of infection. The leukocytosis is likely associated with the inflammatory state associated with her crisis. 3. Anemia of chronic disease: The patient has a hemoglobin of 7.9  which is essentially at the target hemoglobin of 8 recommended in a pregnant patient with sickle cell. Would not transfuse at this time. 4. Chronic pain: Continue methadone 5 mg daily at bedtime. 5. Third trimester pregnancy: Patient is 51 + weeks gestation and is to be induced tomorrow. Will defer to Ob-Gyn.   Code Status: Full Code Family Communication: N/A Disposition Plan: Not yet ready for discharge  [place].  Pager [phone]. If 7PM-7AM, please contact night-coverage.  10/30/2015, 1:11 PM  LOS: 3 days   Interim History: Patient reports that she feels a little bit better today. She rates her pain as 7-8/10 and localized to left shoulder and upper back.    Procedures:  None   Antibiotics:  None   Objective: Filed Vitals:   10/30/15 0155 10/30/15 0210 10/30/15 0520 10/30/15 0821  BP:  121/58 112/47 124/81  Pulse: 102 104 107 111  Temp:  99.2 F (37.3 C) 98.4 F (36.9 C) 98.4 F (36.9 C)  TempSrc:  Oral Oral Oral  Resp: 18 20 14 20   Height:      Weight:   188 lb (85.276 kg)   SpO2: 96% 95% 94%    Weight change: 2 lb 15.9 oz (1.359 kg)  Intake/Output  Summary (Last 24 hours) at 10/30/15 1311 Last data filed at 10/30/15 0523  Gross per 24 hour  Intake 2110.38 ml  Output   1650 ml  Net 460.38 ml       General: Alert, awake, oriented x3, in no apparent distress.  HEENT: [place]/AT PEERL, EOMI, anicteric Neck: Trachea midline,  no masses, no thyromegal,y no JVD, no carotid bruit OROPHARYNX:  Moist, No exudate/ erythema/lesions.  Heart: Regular rate and rhythm, without murmurs, rubs, gallops, PMI non-displaced, no heaves or thrills on palpation.  Lungs: Clear to auscultation, no wheezing or rhonchi noted. No increased vocal fremitus resonant to percussion  Abdomen: Gravid Neuro: No focal neurological deficits noted cranial nerves II through XII grossly intact. Strength  in bilateral upper and lower extremities. Musculoskeletal: No warm swelling or erythema around joints, no spinal tenderness noted. Psychiatric: Patient alert and oriented x3, good insight and cognition, good recent to remote recall.    Data Reviewed: Basic Metabolic Panel:  Recent Labs Lab 10/27/15 0015 10/28/15 1320  NA  --  132*  K  --  4.7  CL  --  106  CO2  --  23  GLUCOSE  --  401*  BUN  --  <5*  CREATININE 0.46 0.58  CALCIUM  --  8.0*   Liver Function Tests:  Recent Labs Lab 10/28/15 1320  AST 29  ALT 47  ALKPHOS 138*  BILITOT 2.3*  PROT 5.7*  ALBUMIN 2.9*   No results  for input(s): LIPASE, AMYLASE in the last 168 hours. No results for input(s): AMMONIA in the last 168 hours. CBC:  Recent Labs Lab 10/26/15 1700 10/28/15 1320  WBC 17.5* 15.1*  NEUTROABS 14.0* 11.4*  HGB 9.6* 7.9*  HCT 27.3* 23.0*  MCV 86.9 88.5  PLT 459* 358   Cardiac Enzymes: No results for input(s): CKTOTAL, CKMB, CKMBINDEX, TROPONINI in the last 168 hours. BNP (last 3 results) No results for input(s): BNP in the last 8760 hours.  ProBNP (last 3 results) No results for input(s): PROBNP in the last 8760 hours.  CBG:  Recent Labs Lab 10/29/15 0849  GLUCAP  102*    No results found for this or any previous visit (from the past 240 hour(s)).   Studies: Ir Fluoro Guide Cv Line Right  10/16/2015  CLINICAL DATA:  Sickle cell anemia with crisis during pregnancy. Poor IV access and need for PICC line. EXAM: POWER PICC LINE PLACEMENT WITH ULTRASOUND AND FLUOROSCOPIC GUIDANCE FLUOROSCOPY TIME:  54 seconds. PROCEDURE: The patient was advised of the possible risks and complications and agreed to undergo the procedure. The patient was then brought to the angiographic suite for the procedure. The right arm was prepped with chlorhexidine, draped in the usual sterile fashion using maximum barrier technique (cap and mask, sterile gown, sterile gloves, large sterile sheet, hand hygiene and cutaneous antisepsis) and infiltrated locally with 1% Lidocaine. Ultrasound demonstrated patency of the right brachial vein, and this was documented with an image. Under real-time ultrasound guidance, this vein was accessed with a 21 gauge micropuncture needle and image documentation was performed. A 0.018 wire was introduced in to the vein. Over this, a 5.0 [place] dual lumen power injectable PICC was advanced to the lower SVC/right atrial junction. Fluoroscopy during the procedure and fluoro spot radiograph confirms appropriate catheter position. The catheter was flushed and covered with a sterile dressing. Catheter length: 33 cm COMPLICATIONS: None IMPRESSION: Successful right arm power injectable PICC line placement with ultrasound and fluoroscopic guidance. The catheter is ready for use. Electronically Signed   By: Aletta Edouard M.D.   On: 10/16/2015 15:43   Ir US Guide Vasc Access Right  10/16/2015  CLINICAL DATA:  Sickle cell anemia with crisis during pregnancy. Poor IV access and need for PICC line. EXAM: POWER PICC LINE PLACEMENT WITH ULTRASOUND AND FLUOROSCOPIC GUIDANCE FLUOROSCOPY TIME:  54 seconds. PROCEDURE: The patient was advised of the possible risks and complications and  agreed to undergo the procedure. The patient was then brought to the angiographic suite for the procedure. The right arm was prepped with chlorhexidine, draped in the usual sterile fashion using maximum barrier technique (cap and mask, sterile gown, sterile gloves, large sterile sheet, hand hygiene and cutaneous antisepsis) and infiltrated locally with 1% Lidocaine. Ultrasound demonstrated patency of the right brachial vein, and this was documented with an image. Under real-time ultrasound guidance, this vein was accessed with a 21 gauge micropuncture needle and image documentation was performed. A 0.018 wire was introduced in to the vein. Over this, a 5.0 [place] dual lumen power injectable PICC was advanced to the lower SVC/right atrial junction. Fluoroscopy during the procedure and fluoro spot radiograph confirms appropriate catheter position. The catheter was flushed and covered with a sterile dressing. Catheter length: 33 cm COMPLICATIONS: None IMPRESSION: Successful right arm power injectable PICC line placement with ultrasound and fluoroscopic guidance. The catheter is ready for use. Electronically Signed   By: Aletta Edouard M.D.   On: 10/16/2015 15:43   [place]  Mfm Fetal Bpp Wo Non Stress  10/29/2015  OBSTETRICAL ULTRASOUND: This exam was performed within a [place] Ultrasound Department. The OB US report was generated in the AS system, and faxed to the ordering physician.  This report is available in the [facility]. See the AS Obstetric US report via the Image Link.  [place] Mfm Fetal Bpp Wo Non Stress  10/20/2015  OBSTETRICAL ULTRASOUND: This exam was performed within a [place] Ultrasound Department. The OB US report was generated in the AS system, and faxed to the ordering physician.  This report is available in the [facility]. See the AS Obstetric US report via the Image Link.  [place] Mfm Fetal Bpp Wo Non Stress  10/13/2015  OBSTETRICAL ULTRASOUND: This exam was performed within a [place]  Ultrasound Department. The OB US report was generated in the AS system, and faxed to the ordering physician.  This report is available in the [facility]. See the AS Obstetric US report via the Image Link.  [place] Mfm Fetal Bpp Wo Non Stress  10/06/2015  OBSTETRICAL ULTRASOUND: This exam was performed within a [place] Ultrasound Department. The OB US report was generated in the AS system, and faxed to the ordering physician.  This report is available in the [facility]. See the AS Obstetric US report via the Image Link.  [place] Mfm Ob Follow Up  10/13/2015  OBSTETRICAL ULTRASOUND: This exam was performed within a [place] Ultrasound Department. The OB US report was generated in the AS system, and faxed to the ordering physician.  This report is available in the [facility]. See the AS Obstetric US report via the Image Link.  [place] Mfm Ob Limited  10/27/2015  OBSTETRICAL ULTRASOUND: This exam was performed within a [place] Ultrasound Department. The OB US report was generated in the AS system, and faxed to the ordering physician.  This report is available in the [facility]. See the AS Obstetric US report via the Image Link.   Scheduled Meds: . docusate sodium  100 mg Oral Daily  . folic acid  1 mg Oral Daily  . HYDROmorphone   Intravenous Q4H  . methadone  5 mg Oral Daily  . prenatal multivitamin  1 tablet Oral Q1200  . valACYclovir  500 mg Oral BID   Continuous Infusions: . lactated ringers 10 mL/hr at 10/30/15 1218    Time spent 25 minutes.

## 2015-10-30 NOTE — Progress Notes (Signed)
Epidural run at minimal dosing til labor develops, and will increase dilaudid PCA to full dosing for pt's arm and back pain for alleged pain crisis.

## 2015-10-30 NOTE — Progress Notes (Signed)
Patient ID: [name], female   DOB: 1991-01-25, 25 y.o.   MRN: [id number] I spoke to Dr. Delma Post from anesthesia and he agrees that pt can have regional anesthesia 12 hours after Lovenox.  Will have pt transferred to L&D for epidural and ECV and the IOL.  I spoke to pt and she agrees with plan of care.   Of note: it has been reported by nursing that pt is giving herself boluses of IVF after her Dilaudid PCA bolus.   I have explained to pt the dangers of this to her and her baby.  I have also notified her that if she boluses herself with Pitocin she could mortally injure herself and her baby. She reports that she will not adjust the IV again.  Yashas Camilli L. [name], M.D., Cherlynn June

## 2015-10-31 ENCOUNTER — Encounter (HOSPITAL_COMMUNITY): Payer: Self-pay | Admitting: *Deleted

## 2015-10-31 DIAGNOSIS — A6009 Herpesviral infection of other urogenital tract: Secondary | ICD-10-CM | POA: Diagnosis not present

## 2015-10-31 DIAGNOSIS — O9902 Anemia complicating childbirth: Secondary | ICD-10-CM | POA: Diagnosis not present

## 2015-10-31 DIAGNOSIS — Z3A38 38 weeks gestation of pregnancy: Secondary | ICD-10-CM

## 2015-10-31 DIAGNOSIS — D57819 Other sickle-cell disorders with crisis, unspecified: Secondary | ICD-10-CM | POA: Diagnosis not present

## 2015-10-31 DIAGNOSIS — O99019 Anemia complicating pregnancy, unspecified trimester: Secondary | ICD-10-CM | POA: Diagnosis not present

## 2015-10-31 DIAGNOSIS — D571 Sickle-cell disease without crisis: Secondary | ICD-10-CM | POA: Diagnosis not present

## 2015-10-31 DIAGNOSIS — O43893 Other placental disorders, third trimester: Secondary | ICD-10-CM | POA: Diagnosis not present

## 2015-10-31 DIAGNOSIS — O9832 Other infections with a predominantly sexual mode of transmission complicating childbirth: Secondary | ICD-10-CM | POA: Diagnosis not present

## 2015-10-31 LAB — TYPE AND SCREEN
ABO/RH(D): B POS
ANTIBODY SCREEN: NEGATIVE
UNIT DIVISION: 0
Unit division: 0

## 2015-10-31 LAB — RPR: RPR Ser Ql: NONREACTIVE

## 2015-10-31 MED ORDER — MISOPROSTOL 200 MCG PO TABS
ORAL_TABLET | ORAL | Status: AC
  Filled 2015-10-31: qty 5

## 2015-10-31 MED ORDER — SODIUM CHLORIDE 0.9% FLUSH: 9.0000 mL | INTRAVENOUS | Status: DC | PRN

## 2015-10-31 MED ORDER — ZOLPIDEM TARTRATE 5 MG PO TABS: 5.0000 mg | ORAL_TABLET | Freq: Every evening | ORAL | Status: DC | PRN

## 2015-10-31 MED ORDER — MISOPROSTOL 200 MCG PO TABS: 600.0000 ug | ORAL_TABLET | Freq: Once | ORAL | Status: DC | PRN

## 2015-10-31 MED ORDER — DIBUCAINE 1 % RE OINT
1.0000 "application " | TOPICAL_OINTMENT | RECTAL | Status: DC | PRN
  Filled 2015-10-31: qty 28

## 2015-10-31 MED ORDER — MAGNESIUM HYDROXIDE 400 MG/5ML PO SUSP: 30.0000 mL | ORAL | Status: DC | PRN

## 2015-10-31 MED ORDER — MEASLES, MUMPS & RUBELLA VAC ~~LOC~~ INJ
0.5000 mL | INJECTION | Freq: Once | SUBCUTANEOUS | Status: DC
  Filled 2015-10-31: qty 0.5

## 2015-10-31 MED ORDER — SIMETHICONE 80 MG PO CHEW: 80.0000 mg | CHEWABLE_TABLET | ORAL | Status: DC | PRN

## 2015-10-31 MED ORDER — OXYTOCIN 40 UNITS IN LACTATED RINGERS INFUSION - SIMPLE MED
1.0000 m[IU]/min | INTRAVENOUS | Status: DC
  Administered 2015-10-31: 2 m[IU]/min via INTRAVENOUS

## 2015-10-31 MED ORDER — COCONUT OIL OIL
1.0000 "application " | TOPICAL_OIL | Status: DC | PRN
  Filled 2015-10-31: qty 120

## 2015-10-31 MED ORDER — FERROUS SULFATE 325 (65 FE) MG PO TABS
325.0000 mg | ORAL_TABLET | Freq: Two times a day (BID) | ORAL | Status: DC
  Administered 2015-11-01 – 2015-11-02 (×3): 325 mg via ORAL
  Filled 2015-10-31 (×3): qty 1

## 2015-10-31 MED ORDER — DIPHENHYDRAMINE HCL 25 MG PO CAPS: 25.0000 mg | ORAL_CAPSULE | Freq: Four times a day (QID) | ORAL | Status: DC | PRN

## 2015-10-31 MED ORDER — ONDANSETRON HCL 4 MG/2ML IJ SOLN: 4.0000 mg | Freq: Four times a day (QID) | INTRAMUSCULAR | Status: DC | PRN

## 2015-10-31 MED ORDER — ONDANSETRON HCL 4 MG/2ML IJ SOLN: 4.0000 mg | INTRAMUSCULAR | Status: DC | PRN

## 2015-10-31 MED ORDER — ACETAMINOPHEN 325 MG PO TABS: 650.0000 mg | ORAL_TABLET | ORAL | Status: DC | PRN

## 2015-10-31 MED ORDER — BENZOCAINE-MENTHOL 20-0.5 % EX AERO
1.0000 "application " | INHALATION_SPRAY | CUTANEOUS | Status: DC | PRN
  Filled 2015-10-31: qty 56

## 2015-10-31 MED ORDER — NALOXONE HCL 0.4 MG/ML IJ SOLN: 0.4000 mg | INTRAMUSCULAR | Status: DC | PRN

## 2015-10-31 MED ORDER — PRENATAL MULTIVITAMIN CH
1.0000 | ORAL_TABLET | Freq: Every day | ORAL | Status: DC
  Administered 2015-11-01 – 2015-11-02 (×3): 1 via ORAL
  Filled 2015-10-31 (×2): qty 1

## 2015-10-31 MED ORDER — DIPHENHYDRAMINE HCL 12.5 MG/5ML PO ELIX
12.5000 mg | ORAL_SOLUTION | Freq: Four times a day (QID) | ORAL | Status: DC | PRN
  Filled 2015-10-31: qty 5

## 2015-10-31 MED ORDER — HYDROMORPHONE 1 MG/ML IV SOLN: INTRAVENOUS | Status: DC

## 2015-10-31 MED ORDER — ONDANSETRON HCL 4 MG PO TABS: 4.0000 mg | ORAL_TABLET | ORAL | Status: DC | PRN

## 2015-10-31 MED ORDER — TERBUTALINE SULFATE 1 MG/ML IJ SOLN: 0.2500 mg | Freq: Once | INTRAMUSCULAR | Status: DC | PRN

## 2015-10-31 MED ORDER — HYDROMORPHONE 1 MG/ML IV SOLN
INTRAVENOUS | Status: DC
  Administered 2015-10-31: 0.4 mg via INTRAVENOUS
  Administered 2015-10-31: 6.8 mg via INTRAVENOUS
  Administered 2015-10-31: 3 mg via INTRAVENOUS
  Administered 2015-11-01: 3.4 mg via INTRAVENOUS
  Administered 2015-11-01: 1 mg via INTRAVENOUS
  Filled 2015-10-31: qty 25

## 2015-10-31 MED ORDER — SENNOSIDES-DOCUSATE SODIUM 8.6-50 MG PO TABS
2.0000 | ORAL_TABLET | ORAL | Status: DC
  Administered 2015-10-31: 2 via ORAL
  Filled 2015-10-31 (×2): qty 2

## 2015-10-31 MED ORDER — DIPHENHYDRAMINE HCL 50 MG/ML IJ SOLN: 12.5000 mg | Freq: Four times a day (QID) | INTRAMUSCULAR | Status: DC | PRN

## 2015-10-31 MED ORDER — WITCH HAZEL-GLYCERIN EX PADS
1.0000 | MEDICATED_PAD | CUTANEOUS | Status: DC | PRN
Start: 2015-10-31 — End: 2015-11-02

## 2015-10-31 MED ORDER — TETANUS-DIPHTH-ACELL PERTUSSIS 5-2.5-18.5 LF-MCG/0.5 IM SUSP
0.5000 mL | Freq: Once | INTRAMUSCULAR | Status: DC
  Filled 2015-10-31: qty 0.5

## 2015-10-31 MED ORDER — IBUPROFEN 600 MG PO TABS
600.0000 mg | ORAL_TABLET | Freq: Four times a day (QID) | ORAL | Status: DC
  Administered 2015-10-31 – 2015-11-02 (×8): 600 mg via ORAL
  Filled 2015-10-31 (×7): qty 1

## 2015-10-31 NOTE — Progress Notes (Signed)
Labor Progress Note BRANDEN FERRAR is a 25 y.o. G3P1011 at [redacted]w[redacted]d presented for IOL 2/2 sickle cell anemia  S: sleeping soundly. Has PCA and Epidural in place.   O:  BP 125/67 mmHg  Pulse 105  Temp(Src) 98.5 F (36.9 C) (Oral)  Resp 19  Ht 5\' 1"  (1.549 m)  Wt 188 lb (85.276 kg)  BMI 35.54 kg/m2  SpO2 99%  LMP 01/24/2015 EFM: 150/mod/+accels, no decels  CVE: Dilation: 3 Effacement (%): 50 Cervical Position: Anterior Station: -3 Presentation: Vertex Exam by:: Lauretta Chester MD   A&P: 25 y.o. G3P1011 [redacted]w[redacted]d IOL for sickle cell #Labor: Progressing well. Will start pitocin #Pain: PCA and epidural. Wearing O2 given risk of hypoxia from respiratory depression #FWB: Cat I #GBS positive- start PCN  Caren Macadam, MD 2:37 AM

## 2015-10-31 NOTE — Progress Notes (Signed)
Patient ID: [name], female   DOB: 14-Apr-1991, 25 y.o.   MRN: [id number] [name] is a 25 y.o. G3P1011 at [redacted]w[redacted]d.  Subjective: Rectal pressure  Objective: BP 129/88 mmHg  Pulse 94  Temp(Src) 98.4 F (36.9 C) (Oral)  Resp 14  Ht 5\' 1"  (1.549 m)  Wt 188 lb (85.276 kg)  BMI 35.54 kg/m2  SpO2 98%  LMP 01/24/2015   FHT:  FHR: 130 bpm, variability: min,  accelerations:  10x10,  decelerations:  [place] UC:   Q 2-4 minutes, strong  Dilation: 8 Effacement (%): 100 Cervical Position: Anterior Station: 0 Presentation: Vertex Exam by:: Carter Kitten RNC  Labs: No results found. However, due to the size of the patient record, not all encounters were searched. Please check Results Review for a complete set of results.  Assessment / Plan: [redacted]w[redacted]d week IUP Labor: transition Fetal Wellbeing:  Category I-II Pain Control:  Epidural, Dilaudid PCA Anticipated MOD:  SVD  [place], CNM 10/31/2015 4:33 PM

## 2015-10-31 NOTE — Progress Notes (Signed)
Patient ID: [name], female   DOB: 1990/09/09, 25 y.o.   MRN: [id number] [name] is a 25 y.o. G3P1011 at [redacted]w[redacted]d.  Subjective: Comfortable w./ epidural.   Objective: BP 131/74 mmHg  Pulse 90  Temp(Src) 98.4 F (36.9 C) (Oral)  Resp 14  Ht 5\' 1"  (1.549 m)  Wt 188 lb (85.276 kg)  BMI 35.54 kg/m2  SpO2 97%  LMP 01/24/2015   FHT:  FHR: 135 bpm, variability: min-mod,  accelerations:  15x15,  decelerations:  none UC:   Q 2-4 minutes, strong  Dilation: 3.5 Effacement (%): 60 Cervical Position: Anterior Station: -2 Presentation: Vertex2 Exam by:: Marlou Porch CNM  Labs: No results found. However, due to the size of the patient record, not all encounters were searched. Please check Results Review for a complete set of results.  Assessment / Plan: [redacted]w[redacted]d week IUP Labor: Early Fetal Wellbeing:  Category I Pain Control:  Epidural and Dilaudid PCA Anticipated MOD:  SVD  [place], CNM 10/31/2015 11:45 AM

## 2015-10-31 NOTE — Progress Notes (Signed)
SICKLE CELL SERVICE PROGRESS NOTE  [name] [id number] DOB: March 21, 1991 DOA: 10/26/2015 PCP: Angelica Chessman, MD  Assessment/Plan: Principal Problem:   Maternal sickle cell anemia affecting pregnancy, antepartum ([name]) Active Problems:   Sickle cell disease ([name])   Herpes simplex type 2 (HSV-2) infection affecting pregnancy, antepartum   Positive GBS test   Sickle cell pain crisis ([place])   Hb-SS disease with crisis ([place])  1. Hb SS with crisis: Patient to be induced today. Will continue PCA and clinician assisted Dilaudid at current doses. Should also continue her antepartum dose of Lovenox. She is mostly having labor pain and not sickle cell crisis.  2. Leukocytosis: No evidence of infection. The leukocytosis is likely associated with the inflammatory state associated with her crisis. 3. Anemia of chronic disease: Hemoglobin has been at baseline for pregnancy. Will re-check in am. 4. Chronic pain: Continue methadone 5 mg daily at bedtime. Continue PCA for acute pain. 5. Third trimester pregnancy: Patient is 15 + weeks gestation and is to be induced today. Will defer to Ob-Gyn.   Code Status: Full Code Family Communication: N/A Disposition Plan: Not yet ready for discharge  [facility]  Pager [phone]. If 7PM-7AM, please contact night-coverage.  10/31/2015, 7:27 AM  LOS: 4 days   Interim History: Patient reports that she feels a little bit better today. She rates her pain as 6/10 and localized to left shoulder and upper back. She is to be induced today with her early labor.   Procedures:  None   Antibiotics:  None   Objective: Filed Vitals:   10/31/15 0530 10/31/15 0600 10/31/15 0630 10/31/15 0700  BP: 125/78 110/58 112/64 115/70  Pulse: 90 92 115 90  Temp:      TempSrc:      Resp:   14 14  Height:      Weight:      SpO2: 98%      Weight change:   Intake/Output Summary (Last 24 hours) at 10/31/15 0727 Last data filed at 10/31/15 [id number]  Gross per 24  hour  Intake      0 ml  Output   2200 ml  Net  -2200 ml       General: Alert, awake, oriented x3, in no apparent distress.  HEENT: [place]/AT PEERL, EOMI, anicteric Neck: Trachea midline,  no masses, no thyromegal,y no JVD, no carotid bruit OROPHARYNX:  Moist, No exudate/ erythema/lesions.  Heart: Regular rate and rhythm, without murmurs, rubs, gallops, PMI non-displaced, no heaves or thrills on palpation.  Lungs: Clear to auscultation, no wheezing or rhonchi noted. No increased vocal fremitus resonant to percussion  Abdomen: Gravid Neuro: No focal neurological deficits noted cranial nerves II through XII grossly intact. Strength  in bilateral upper and lower extremities. Musculoskeletal: No warm swelling or erythema around joints, no spinal tenderness noted. Psychiatric: Patient alert and oriented x3, good insight and cognition, good recent to remote recall.    Data Reviewed: Basic Metabolic Panel:  Recent Labs Lab 10/27/15 0015 10/28/15 1320 10/30/15 1310  NA  --  132* 134*  K  --  4.7 4.0  CL  --  106 103  CO2  --  23 25  GLUCOSE  --  401* 83  BUN  --  <5* <5*  CREATININE 0.46 0.58 0.35*  CALCIUM  --  8.0* 8.7*   Liver Function Tests:  Recent Labs Lab 10/28/15 1320  AST 29  ALT 47  ALKPHOS 138*  BILITOT 2.3*  PROT 5.7*  ALBUMIN 2.9*  No results for input(s): LIPASE, AMYLASE in the last 168 hours. No results for input(s): AMMONIA in the last 168 hours. CBC:  Recent Labs Lab 10/26/15 1700 10/28/15 1320 10/30/15 1310  WBC 17.5* 15.1* 15.5*  NEUTROABS 14.0* 11.4* 12.2*  HGB 9.6* 7.9* 8.5*  HCT 27.3* 23.0* 24.7*  MCV 86.9 88.5 88.5  PLT 459* 358 401*   Cardiac Enzymes: No results for input(s): CKTOTAL, CKMB, CKMBINDEX, TROPONINI in the last 168 hours. BNP (last 3 results) No results for input(s): BNP in the last 8760 hours.  ProBNP (last 3 results) No results for input(s): PROBNP in the last 8760 hours.  CBG:  Recent Labs Lab 10/29/15 0849   GLUCAP 102*    No results found for this or any previous visit (from the past 240 hour(s)).   Studies: Ir Fluoro Guide Cv Line Right  10/16/2015  CLINICAL DATA:  Sickle cell anemia with crisis during pregnancy. Poor IV access and need for PICC line. EXAM: POWER PICC LINE PLACEMENT WITH ULTRASOUND AND FLUOROSCOPIC GUIDANCE FLUOROSCOPY TIME:  54 seconds. PROCEDURE: The patient was advised of the possible risks and complications and agreed to undergo the procedure. The patient was then brought to the angiographic suite for the procedure. The right arm was prepped with chlorhexidine, draped in the usual sterile fashion using maximum barrier technique (cap and mask, sterile gown, sterile gloves, large sterile sheet, hand hygiene and cutaneous antisepsis) and infiltrated locally with 1% Lidocaine. Ultrasound demonstrated patency of the right brachial vein, and this was documented with an image. Under real-time ultrasound guidance, this vein was accessed with a 21 gauge micropuncture needle and image documentation was performed. A 0.018 wire was introduced in to the vein. Over this, a 5.0 [place] dual lumen power injectable PICC was advanced to the lower SVC/right atrial junction. Fluoroscopy during the procedure and fluoro spot radiograph confirms appropriate catheter position. The catheter was flushed and covered with a sterile dressing. Catheter length: 33 cm COMPLICATIONS: None IMPRESSION: Successful right arm power injectable PICC line placement with ultrasound and fluoroscopic guidance. The catheter is ready for use. Electronically Signed   By: Aletta Edouard M.D.   On: 10/16/2015 15:43   Ir US Guide Vasc Access Right  10/16/2015  CLINICAL DATA:  Sickle cell anemia with crisis during pregnancy. Poor IV access and need for PICC line. EXAM: POWER PICC LINE PLACEMENT WITH ULTRASOUND AND FLUOROSCOPIC GUIDANCE FLUOROSCOPY TIME:  54 seconds. PROCEDURE: The patient was advised of the possible risks and  complications and agreed to undergo the procedure. The patient was then brought to the angiographic suite for the procedure. The right arm was prepped with chlorhexidine, draped in the usual sterile fashion using maximum barrier technique (cap and mask, sterile gown, sterile gloves, large sterile sheet, hand hygiene and cutaneous antisepsis) and infiltrated locally with 1% Lidocaine. Ultrasound demonstrated patency of the right brachial vein, and this was documented with an image. Under real-time ultrasound guidance, this vein was accessed with a 21 gauge micropuncture needle and image documentation was performed. A 0.018 wire was introduced in to the vein. Over this, a 5.0 [place] dual lumen power injectable PICC was advanced to the lower SVC/right atrial junction. Fluoroscopy during the procedure and fluoro spot radiograph confirms appropriate catheter position. The catheter was flushed and covered with a sterile dressing. Catheter length: 33 cm COMPLICATIONS: None IMPRESSION: Successful right arm power injectable PICC line placement with ultrasound and fluoroscopic guidance. The catheter is ready for use. Electronically Signed   By: Eulas Post  Kathlene Cote M.D.   On: 10/16/2015 15:43   [place] Mfm Fetal Bpp Wo Non Stress  10/29/2015  OBSTETRICAL ULTRASOUND: This exam was performed within a [place] Ultrasound Department. The OB US report was generated in the AS system, and faxed to the ordering physician.  This report is available in the [facility]. See the [facility] Obstetric US report via the Image Link.  [place] Mfm Fetal Bpp Wo Non Stress  10/20/2015  OBSTETRICAL ULTRASOUND: This exam was performed within a [place] Ultrasound Department. The OB US report was generated in the AS system, and faxed to the ordering physician.  This report is available in the [facility]. See the [facility] Obstetric US report via the [facility].  [place] Mfm Fetal Bpp Wo Non Stress  10/13/2015  OBSTETRICAL ULTRASOUND: This exam was performed within a  [place] Ultrasound Department. The OB US report was generated in the AS system, and faxed to the ordering physician.  This report is available in the [facility]. See the [facility] Obstetric US report via the [facility].  [place] Mfm Fetal Bpp Wo Non Stress  10/06/2015  OBSTETRICAL ULTRASOUND: This exam was performed within a [place] Ultrasound Department. The OB US report was generated in the AS system, and faxed to the ordering physician.  This report is available in the [facility]. See the [facility] Obstetric US report via the [facility].  [place] Mfm Ob Follow Up  10/13/2015  OBSTETRICAL ULTRASOUND: This exam was performed within a [place] Ultrasound Department. The OB US report was generated in the AS system, and faxed to the ordering physician.  This report is available in the [facility]. See the [facility] Obstetric US report via the [facility].  [place] Mfm Ob Limited  10/27/2015  OBSTETRICAL ULTRASOUND: This exam was performed within a [place] Ultrasound Department. The OB US report was generated in the AS system, and faxed to the ordering physician.  This report is available in the [facility]. See the [facility] Obstetric US report via the [facility].   Scheduled Meds: . HYDROmorphone   Intravenous Q4H  . lactated ringers  500 mL Intravenous Once  . methadone  5 mg Oral Daily  . pencillin G potassium IV  2.5 Million Units Intravenous Q4H  . valACYclovir  500 mg Oral BID   Continuous Infusions: . fentaNYL 2.5 mcg/ml w/bupivacaine 1/10% in NS 17ml epidural infusion (124ml total) 14 mL/hr (10/31/15 0645)  . lactated ringers 125 mL/hr at 10/31/15 0154  . oxytocin 40 units in LR 1000 mL    . oxytocin    . oxytocin 2 milli-units/min (10/31/15 0250)    Time spent 25 minutes.

## 2015-10-31 NOTE — Progress Notes (Signed)
Anesthesia notified of pain management.  Increased epidural rate to 14cc/hr trom 10cc/hr.

## 2015-11-01 DIAGNOSIS — O99019 Anemia complicating pregnancy, unspecified trimester: Secondary | ICD-10-CM | POA: Diagnosis not present

## 2015-11-01 DIAGNOSIS — D571 Sickle-cell disease without crisis: Secondary | ICD-10-CM | POA: Diagnosis not present

## 2015-11-01 DIAGNOSIS — O9902 Anemia complicating childbirth: Secondary | ICD-10-CM | POA: Diagnosis not present

## 2015-11-01 LAB — TYPE AND SCREEN
ABO/RH(D): B POS
Antibody Screen: NEGATIVE
UNIT DIVISION: 0
Unit division: 0

## 2015-11-01 LAB — CBC
HEMATOCRIT: 22.6 % — AB (ref 36.0–46.0)
HEMOGLOBIN: 7.7 g/dL — AB (ref 12.0–15.0)
MCH: 29.8 pg (ref 26.0–34.0)
MCHC: 34.1 g/dL (ref 30.0–36.0)
MCV: 87.6 fL (ref 78.0–100.0)
Platelets: 372 10*3/uL (ref 150–400)
RBC: 2.58 MIL/uL — ABNORMAL LOW (ref 3.87–5.11)
RDW: 18.6 % — ABNORMAL HIGH (ref 11.5–15.5)
WBC: 22.4 10*3/uL — ABNORMAL HIGH (ref 4.0–10.5)

## 2015-11-01 MED ORDER — OXYCODONE-ACETAMINOPHEN 5-325 MG PO TABS
2.0000 | ORAL_TABLET | ORAL | Status: DC | PRN
  Administered 2015-11-01 (×2): 2 via ORAL
  Filled 2015-11-01 (×2): qty 2

## 2015-11-01 MED ORDER — SODIUM CHLORIDE 0.9% FLUSH: 9.0000 mL | INTRAVENOUS | Status: DC | PRN

## 2015-11-01 MED ORDER — SODIUM CHLORIDE 0.9% FLUSH
10.0000 mL | Freq: Two times a day (BID) | INTRAVENOUS | Status: DC
  Administered 2015-11-01: 10 mL

## 2015-11-01 MED ORDER — HYDROMORPHONE HCL 2 MG PO TABS
2.0000 mg | ORAL_TABLET | ORAL | Status: DC | PRN
  Filled 2015-11-01: qty 1

## 2015-11-01 MED ORDER — HYDROMORPHONE HCL 2 MG PO TABS
2.0000 mg | ORAL_TABLET | ORAL | Status: DC | PRN
  Administered 2015-11-01 – 2015-11-02 (×2): 2 mg via ORAL
  Filled 2015-11-01: qty 1

## 2015-11-01 MED ORDER — SODIUM CHLORIDE 0.9% FLUSH: 10.0000 mL | Freq: Two times a day (BID) | INTRAVENOUS | Status: DC

## 2015-11-01 MED ORDER — HYDROMORPHONE 1 MG/ML IV SOLN: INTRAVENOUS | Status: DC

## 2015-11-01 MED ORDER — OXYCODONE HCL 5 MG PO TABS
10.0000 mg | ORAL_TABLET | ORAL | Status: DC | PRN
  Administered 2015-11-01 – 2015-11-02 (×4): 10 mg via ORAL
  Filled 2015-11-01 (×4): qty 2

## 2015-11-01 MED ORDER — SODIUM CHLORIDE 0.9 % IV SOLN
INTRAVENOUS | Status: DC
  Administered 2015-11-01: 21:00:00 via INTRAVENOUS

## 2015-11-01 MED ORDER — NALOXONE HCL 0.4 MG/ML IJ SOLN: 0.4000 mg | INTRAMUSCULAR | Status: DC | PRN

## 2015-11-01 NOTE — Progress Notes (Signed)
OB Note Late entry for 1800.  [place] on call team paged but no answer, regarding PCA dosing and whether there's a need to restart her ppx lovenox that she was on during the antepartum period. They are rounding on her daily and can leave PCA at current dosing (epidural to be removed before going to post partum) and she wouldn't be started on lovenox after a vaginal delivery until 12-24hrs after delivery anyways, if they deemed her to still be in an acute crisis.  Will follow up in the AM  Durene Romans MD Attending Center for [facility])

## 2015-11-01 NOTE — Progress Notes (Signed)
Patient was asleep when i went in to check on her pain. I woke her up so she wanted more percocets pain was at a 5 will monitor.

## 2015-11-01 NOTE — Anesthesia Postprocedure Evaluation (Signed)
Anesthesia Post Note  [name]  Procedure(s) Performed: * No procedures listed *  Patient location during evaluation: Mother Baby Anesthesia Type: Epidural Level of consciousness: awake Pain management: satisfactory to patient Vital Signs Assessment: post-procedure vital signs reviewed and stable Respiratory status: spontaneous breathing Cardiovascular status: stable Anesthetic complications: no     Last Vitals:  Filed Vitals:   11/01/15 0537 11/01/15 1259  BP:  140/90  Pulse:  106  Temp:  37 C  Resp: 17 18    Last Pain:  Filed Vitals:   11/01/15 1300  PainSc: 5    Pain Goal: Patients Stated Pain Goal: 5 (10/30/15 0805)               Casimer Lanius

## 2015-11-01 NOTE — Discharge Summary (Signed)
OB Discharge Summary     Patient Name: [name] DOB: March 26, 1991 MRN: [id number]  Date of admission: 10/26/2015 Delivering MD: Manya Silvas   Date of discharge: 11/01/2015  Admitting diagnosis: 38w sickel cell crisis Intrauterine pregnancy: [redacted]w[redacted]d     Secondary diagnosis:  Principal Problem:   Maternal sickle cell anemia affecting pregnancy, antepartum ([name]) Active Problems:   Sickle cell disease ([name])   Herpes simplex type 2 (HSV-2) infection affecting pregnancy, antepartum   Positive GBS test   Sickle cell pain crisis ([place])   Hb-SS disease with crisis [facility])   Vaginal delivery  Additional problems: None     Discharge diagnosis: Term Pregnancy Delivered                                                                                                Post partum procedures:None  Augmentation: AROM, Pitocin and Cytotec  Complications: None  Hospital course:  Induction of Labor With Vaginal Delivery   25 y.o. yo [id number] at [redacted]w[redacted]d was admitted to the hospital 10/26/2015 for sickle cell crisis and induction of labour.  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  Pt was placed on Dilaudid PCA and continued methadone home dosing, despite this pt tampered on multiple occasions with pump in attempts to increase dose, which is concerning and placed pt at risk for switching to illegal substance or misuse of current prescribed opiates.  Would recommend in outpatient setting starting a slow wean and or switching to suboxone.  Otherwise pt had no other events and routine eversion with induction was successfully completed on 6/18.  Infant was taken directly to NICU postpartum due to high risk for neonatal withdrawal syndrome and 5 day observation.  Indication for induction: Sickle cell crisis.  Patient had an uncomplicated labor course as  follows: Membrane Rupture Time/Date: 3:15 PM ,10/31/2015   Intrapartum Procedures: Episiotomy: None [1]                                         Lacerations:  None [1]  Patient had delivery of a Viable infant.  Information for the patient's newborn:  Caselyn, Butt [id number]  Delivery Method: Vaginal, Spontaneous Delivery (Filed from Delivery Summary)   10/31/2015  Details of delivery can be found in separate delivery note.  Patient had a routine postpartum course. Patient is discharged home 11/01/2015.   Physical exam  Filed Vitals:   11/01/15 0440 11/01/15 0537 11/01/15 1259 11/01/15 1804  BP:   140/90 96/53  Pulse:   106 95  Temp:   98.6 F (37 C) 99.4 F (37.4 C)  TempSrc:   Oral Oral  Resp: 16 17 18 20   Height:      Weight:      SpO2:   96% 97%   General: alert, cooperative and no distress Lochia: appropriate Uterine Fundus: firm Incision: N/A DVT Evaluation:  No evidence of DVT seen on physical exam. Labs: Lab Results  Component Value Date   WBC 22.4* 11/01/2015   HGB 7.7* 11/01/2015   HCT 22.6* 11/01/2015   MCV 87.6 11/01/2015   PLT 372 11/01/2015   CMP Latest Ref Rng 10/30/2015  Glucose 65 - 99 mg/dL 83  BUN 6 - 20 mg/dL <5(L)  Creatinine 0.44 - 1.00 mg/dL 0.35(L)  Sodium 135 - 145 mmol/L 134(L)  Potassium 3.5 - 5.1 mmol/L 4.0  Chloride 101 - 111 mmol/L 103  CO2 22 - 32 mmol/L 25  Calcium 8.9 - 10.3 mg/dL 8.7(L)  Total Protein 6.5 - 8.1 g/dL -  Total Bilirubin 0.3 - 1.2 mg/dL -  Alkaline Phos 38 - 126 U/L -  AST 15 - 41 U/L -  ALT 14 - 54 U/L -    Discharge instruction: per After Visit Summary and "Baby and Me Booklet".  After visit meds:    Medication List    TAKE these medications        calcium carbonate 500 MG chewable tablet  Commonly known as:  TUMS - dosed in mg elemental calcium  Chew 2 tablets by mouth 2 (two) times daily as needed for indigestion or heartburn.     folic acid 1 MG tablet  Commonly known as:  FOLVITE  Take 1 mg  by mouth daily.     methadone 5 MG tablet  Commonly known as:  DOLOPHINE  Take 1 tablet (5 mg total) by mouth daily.     oxyCODONE 15 MG immediate release tablet  Commonly known as:  ROXICODONE  Take 1 tablet (15 mg total) by mouth every 4 (four) hours as needed for pain.     prenatal multivitamin Tabs tablet  Take 1 tablet by mouth daily at 12 noon.      ASK your doctor about these medications        valACYclovir 500 MG tablet  Commonly known as:  VALTREX  Take 1 tablet (500 mg total) by mouth 2 (two) times daily.  Ask about: Which instructions should I use?        Diet: routine diet  Activity: Advance as tolerated. Pelvic rest for 6 weeks.   Outpatient follow up:6 weeks Follow up Appt:Future Appointments Date Time Provider [place]  11/03/2015 9:30 AM [facility]-MFC [place] 1 WH-MFCUS MFC-US  11/04/2015 10:30 AM Dorena Dew, FNP [facility] None   Follow up Visit:No Follow-up on file.  Postpartum contraception: IUD Paragard  Newborn Data: Live born female  Birth Weight: 6 lb 15.8 oz (3170 g) APGAR: 8, 9  Baby Feeding: Breast Disposition:NICU   11/01/2015 Melany Guernsey, MD

## 2015-11-01 NOTE — Lactation Note (Signed)
This note was copied from a baby's chart. Lactation Consultation Note  Patient Name: [name] [id number] Date: 11/01/2015 Reason for consult: Initial assessment Mom reports she does plan to latch her baby and is asking for DEBP to start pumping. Baby in nursery presently, Mom reports she was not feeling well. Mom on methadone and understands importance of EBM to baby to minimize withdrawal symptoms. Her 50 month old was in NICU due to withdrawals last year. Discussed with Mom the danger of cocaine and advised she cannot breastfeed if using. Mom had positive drug screen for cocaine in March 2017, negative October 26, 2015. Mom reports understanding. Mom may be going back on sickle cell meds, advised she will need to check with MD about risk with BF.  With pumping advised Mom to pump every 3 hours for 15 minutes. RN to set up DEBP for Mom when she is feeling better. Mom reports she will need [facility] referral and may need WIC loaner at discharge. Did not complete WIC referral tonight. Encouraged to call for assist when ready to latch baby. Lactation brochure left for review.   Maternal Data    Feeding Feeding Type: Formula Nipple Type: Slow - flow  LATCH Score/Interventions                      Lactation Tools Discussed/Used Tools: Pump Breast pump type: Double-Electric Breast Pump WIC Program: Yes   Consult Status Consult Status: Follow-up Date: 11/02/15 Follow-up type: In-patient    Katrine Coho 11/01/2015, 10:10 PM

## 2015-11-01 NOTE — Progress Notes (Signed)
SICKLE CELL SERVICE PROGRESS NOTE  [name] [id number] DOB: Oct 23, 1990 DOA: 10/26/2015 PCP: Angelica Chessman, MD  Assessment/Plan: Principal Problem:   Maternal sickle cell anemia affecting pregnancy, antepartum ([name]) Active Problems:   Sickle cell disease ([name])   Herpes simplex type 2 (HSV-2) infection affecting pregnancy, antepartum   Positive GBS test   Sickle cell pain crisis ([place])   Hb-SS disease with crisis ([place])   Vaginal delivery  1. Hb SS with crisis: Will change patient's PCA dosing due to worsening pain after her epidural. This will last for less than 24 hours. We will advance oral medications thereafter. We will start ibuprofen or Toradol since patient has not delivered. 2. Leukocytosis: No evidence of infection. The leukocytosis is likely associated with the inflammatory state associated with her crisis. 3. Anemia of chronic disease: Hemoglobin has been at baseline for pregnancy. Will re-check in am. 4. Chronic pain: Continue methadone 5 mg daily at bedtime. Continue PCA for acute pain. 5. Postpartum: Patient has delivered a baby girl today. We will continue care per Obstetrics. 6.  Code Status: Full Code Family Communication: N/A Disposition Plan: Not yet ready for discharge  [facility]  Pager [phone]. If 7PM-7AM, please contact night-coverage.  11/01/2015, 5:47 PM  LOS: 5 days   Interim History: Patient reports that she is having worsening pain this morning since her epidural wore off. She is still on her methadone and low-dose PCA.  Procedures:  None   Antibiotics:  None   Objective: Filed Vitals:   11/01/15 0147 11/01/15 0440 11/01/15 0537 11/01/15 1259  BP:    140/90  Pulse:    106  Temp:    98.6 F (37 C)  TempSrc:    Oral  Resp: 18 16 17 18   Height:      Weight:      SpO2:    96%   Weight change:   Intake/Output Summary (Last 24 hours) at 11/01/15 1747 Last data filed at 10/31/15 1900  Gross per 24 hour  Intake      0 ml   Output    800 ml  Net   -800 ml       General: Alert, awake, oriented x3, in no apparent distress.  HEENT: [place]/AT PEERL, EOMI, anicteric Neck: Trachea midline,  no masses, no thyromegal,y no JVD, no carotid bruit OROPHARYNX:  Moist, No exudate/ erythema/lesions.  Heart: Regular rate and rhythm, without murmurs, rubs, gallops, PMI non-displaced, no heaves or thrills on palpation.  Lungs: Clear to auscultation, no wheezing or rhonchi noted. No increased vocal fremitus resonant to percussion  Abdomen: Gravid Neuro: No focal neurological deficits noted cranial nerves II through XII grossly intact. Strength  in bilateral upper and lower extremities. Musculoskeletal: No warm swelling or erythema around joints, no spinal tenderness noted. Psychiatric: Patient alert and oriented x3, good insight and cognition, good recent to remote recall.    Data Reviewed: Basic Metabolic Panel:  Recent Labs Lab 10/27/15 0015 10/28/15 1320 10/30/15 1310  NA  --  132* 134*  K  --  4.7 4.0  CL  --  106 103  CO2  --  23 25  GLUCOSE  --  401* 83  BUN  --  <5* <5*  CREATININE 0.46 0.58 0.35*  CALCIUM  --  8.0* 8.7*   Liver Function Tests:  Recent Labs Lab 10/28/15 1320  AST 29  ALT 47  ALKPHOS 138*  BILITOT 2.3*  PROT 5.7*  ALBUMIN 2.9*   No results for input(s):  LIPASE, AMYLASE in the last 168 hours. No results for input(s): AMMONIA in the last 168 hours. CBC:  Recent Labs Lab 10/26/15 1700 10/28/15 1320 10/30/15 1310 11/01/15 0500  WBC 17.5* 15.1* 15.5* 22.4*  NEUTROABS 14.0* 11.4* 12.2*  --   HGB 9.6* 7.9* 8.5* 7.7*  HCT 27.3* 23.0* 24.7* 22.6*  MCV 86.9 88.5 88.5 87.6  PLT 459* 358 401* 372   Cardiac Enzymes: No results for input(s): CKTOTAL, CKMB, CKMBINDEX, TROPONINI in the last 168 hours. BNP (last 3 results) No results for input(s): BNP in the last 8760 hours.  ProBNP (last 3 results) No results for input(s): PROBNP in the last 8760 hours.  CBG:  Recent  Labs Lab 10/29/15 0849  GLUCAP 102*    No results found for this or any previous visit (from the past 240 hour(s)).   Studies: Ir Fluoro Guide Cv Line Right  10/16/2015  CLINICAL DATA:  Sickle cell anemia with crisis during pregnancy. Poor IV access and need for PICC line. EXAM: POWER PICC LINE PLACEMENT WITH ULTRASOUND AND FLUOROSCOPIC GUIDANCE FLUOROSCOPY TIME:  54 seconds. PROCEDURE: The patient was advised of the possible risks and complications and agreed to undergo the procedure. The patient was then brought to the angiographic suite for the procedure. The right arm was prepped with chlorhexidine, draped in the usual sterile fashion using maximum barrier technique (cap and mask, sterile gown, sterile gloves, large sterile sheet, hand hygiene and cutaneous antisepsis) and infiltrated locally with 1% Lidocaine. Ultrasound demonstrated patency of the right brachial vein, and this was documented with an image. Under real-time ultrasound guidance, this vein was accessed with a 21 gauge micropuncture needle and image documentation was performed. A 0.018 wire was introduced in to the vein. Over this, a 5.0 [place] dual lumen power injectable PICC was advanced to the lower SVC/right atrial junction. Fluoroscopy during the procedure and fluoro spot radiograph confirms appropriate catheter position. The catheter was flushed and covered with a sterile dressing. Catheter length: 33 cm COMPLICATIONS: None IMPRESSION: Successful right arm power injectable PICC line placement with ultrasound and fluoroscopic guidance. The catheter is ready for use. Electronically Signed   By: Aletta Edouard M.D.   On: 10/16/2015 15:43   Ir US Guide Vasc Access Right  10/16/2015  CLINICAL DATA:  Sickle cell anemia with crisis during pregnancy. Poor IV access and need for PICC line. EXAM: POWER PICC LINE PLACEMENT WITH ULTRASOUND AND FLUOROSCOPIC GUIDANCE FLUOROSCOPY TIME:  54 seconds. PROCEDURE: The patient was advised of the  possible risks and complications and agreed to undergo the procedure. The patient was then brought to the angiographic suite for the procedure. The right arm was prepped with chlorhexidine, draped in the usual sterile fashion using maximum barrier technique (cap and mask, sterile gown, sterile gloves, large sterile sheet, hand hygiene and cutaneous antisepsis) and infiltrated locally with 1% Lidocaine. Ultrasound demonstrated patency of the right brachial vein, and this was documented with an image. Under real-time ultrasound guidance, this vein was accessed with a 21 gauge micropuncture needle and image documentation was performed. A 0.018 wire was introduced in to the vein. Over this, a 5.0 [place] dual lumen power injectable PICC was advanced to the lower SVC/right atrial junction. Fluoroscopy during the procedure and fluoro spot radiograph confirms appropriate catheter position. The catheter was flushed and covered with a sterile dressing. Catheter length: 33 cm COMPLICATIONS: None IMPRESSION: Successful right arm power injectable PICC line placement with ultrasound and fluoroscopic guidance. The catheter is ready for use. Electronically  Signed   By: Aletta Edouard M.D.   On: 10/16/2015 15:43   [place] Mfm Fetal Bpp Wo Non Stress  10/29/2015  OBSTETRICAL ULTRASOUND: This exam was performed within a [place] Ultrasound Department. The OB US report was generated in the AS system, and faxed to the ordering physician.  This report is available in the [facility]. See the AS Obstetric US report via the Image Link.  [place] Mfm Fetal Bpp Wo Non Stress  10/20/2015  OBSTETRICAL ULTRASOUND: This exam was performed within a [place] Ultrasound Department. The OB US report was generated in the AS system, and faxed to the ordering physician.  This report is available in the [facility]. See the AS Obstetric US report via the Image Link.  [place] Mfm Fetal Bpp Wo Non Stress  10/13/2015  OBSTETRICAL ULTRASOUND: This exam was  performed within a [place] Ultrasound Department. The OB US report was generated in the AS system, and faxed to the ordering physician.  This report is available in the [facility]. See the AS Obstetric US report via the Image Link.  [place] Mfm Fetal Bpp Wo Non Stress  10/06/2015  OBSTETRICAL ULTRASOUND: This exam was performed within a [place] Ultrasound Department. The OB US report was generated in the AS system, and faxed to the ordering physician.  This report is available in the [facility]. See the AS Obstetric US report via the Image Link.  [place] Mfm Ob Follow Up  10/13/2015  OBSTETRICAL ULTRASOUND: This exam was performed within a [place] Ultrasound Department. The OB US report was generated in the AS system, and faxed to the ordering physician.  This report is available in the [facility]. See the AS Obstetric US report via the Image Link.  [place] Mfm Ob Limited  10/27/2015  OBSTETRICAL ULTRASOUND: This exam was performed within a  Ultrasound Department. The OB US report was generated in the AS system, and faxed to the ordering physician.  This report is available in the [facility]. See the AS Obstetric US report via the Image Link.   Scheduled Meds: . ferrous sulfate  325 mg Oral BID WC  . ibuprofen  600 mg Oral Q6H  . measles, mumps and rubella vaccine  0.5 mL Subcutaneous Once  . methadone  5 mg Oral Daily  . prenatal multivitamin  1 tablet Oral Q1200  . senna-docusate  2 tablet Oral Q24H  . sodium chloride flush  10 mL Intracatheter Q12H  . Tdap  0.5 mL Intramuscular Once   Continuous Infusions:    Time spent 25 minutes.

## 2015-11-01 NOTE — Progress Notes (Signed)
PCA dc'd per CNM Elson Clan order. [name] stated it was ok to discontinue the PCA pump and to start patient on percocets every four hours. See order. Patient was notified of plan of action, she was ok with it. I told the patient to let the RN know if her pain gets worse and we can see about getting her more Pain medicine. Pain level now was a 5 out of 10.  Patient stated she was ok with trying the percocets right now. Antenatal RN was called to flush the patients PICC line located in right upper arm per protocol. Dr. Glo Herring was called to ask if he wanted the patient on Lovenox , he was in OR and said he would call me back later. I told patient about the Lovenox and she stated if it is ordered she does not want it. But patient is content with plan of care. All questions answered.

## 2015-11-01 NOTE — Progress Notes (Signed)
Post Partum Day 1 s/p vaginal delivery. With EBL 600 cc. Normal lochia since delivery. Subjective: Pt has continued to complain of shoulder pain, but management of pt pain has been more difficult due to pt's manipulation of PCA settings, so pt has been placed on po methadone this a.m and prn scale of percocet 2 tabs for mild pain, and dilaudid 2 mg q 4h for severe pain. Pt has called requesting eval for worsening shoulder pain and dizziness.  She is voiding regularly. She asks if transfusion is indicated. Pt labs reviewed. Pt had hgb drop 8.5 to 7.7 with delivery.  She mentions slight dizziness. May not be drinking adequate. PCA was d/c'd  As mentioned above.  Objective: Blood pressure 96/53, pulse 95, temperature 99.4 F (37.4 C), temperature source Oral, resp. rate 20, height 5\' 1"  (1.549 m), weight 85.276 kg (188 lb), last menstrual period 02/09/2015, SpO2 97 %, unknown if currently breastfeeding.  Physical Exam:  General: cooperative, appears older than stated age, mild distress and moderate distress Lochia: appropriate Uterine Fundus: firm Incision:  DVT Evaluation: No evidence of DVT seen on physical exam. PT REFUSES TO ALLOW FULL EXAM OF LEFT SHOULDER WHERE PAIN REPORTED. Peripheral pulse noted and skin warm/dry.  Recent Labs  10/30/15 1310 11/01/15 0500  HGB 8.5* 7.7*  HCT 24.7* 22.6*    Assessment/Plan: Chronic pain of chronic disease, possibly prolonged crisis, involving left shoulder primarily. May be dehydrated. Plan:  Restart IV fluids for hydration Discussed pain management in detail with pt , offering to repeat a.m. Methadone dose now,vs po dilaudid , but staying away from IV dilaudid.  Pt agrees to po dilaudid, with 2mg  q4 for pain 5-7, and 4mg  po for pain 8-10.  Will reassess near midnite.    LOS: 5 days   Kellianne Ek V 11/01/2015, 9:10 PM

## 2015-11-01 NOTE — Progress Notes (Signed)
Dr. Ernestina Patches called this am to get notified that the patient has been messing with the rate of her pain meds on her IV pump. She said they will have a meeting and decide plan of action. Patient aware.

## 2015-11-01 NOTE — Progress Notes (Signed)
Dr. Glo Herring called to be notified that patient still has not had a progress note put in for today. Dr. Glo Herring notified that patient did not want her lovenox. He was updated on the patient's pain status of a 5 -6 all day today with PCA pump and with PO meds. He stated he will put a note in on her. Dilaudid 2mg  ordered for breakthrough pain. Patient states her pain is still there at a 5 but is better with the current Oxycodone order. Will continue to monitor pain.

## 2015-11-01 NOTE — Progress Notes (Signed)
Patient complaining of pain and dizziness. Baby taken to nursery and MD called at 2040.  This nurse stayed with patient until Dr Glo Herring arrived at 2043 and patient's nurse was in room.

## 2015-11-01 NOTE — Progress Notes (Signed)
Dr. Ernestina Patches wanted to not increase the PCA rate until they have a plan of action. Patient was in her room laughing and states her pain is a 6.

## 2015-11-01 NOTE — Progress Notes (Signed)
Maintenance fluid IV pump found to be running at 587ml/hr. Pt states she doesn't know why it is running at that rate. Tape covering buttons seemed to be tampered with. Pt has been informed not to press any buttons on the pump. IV pump locked, tape reapplied and fluids returned to ordered rate. Provider notified.

## 2015-11-01 NOTE — Clinical Social Work Maternal (Signed)
  CLINICAL SOCIAL WORK MATERNAL/CHILD NOTE  Patient Details  Name: Girl Saamiya Steuart MRN: [id number] Date of Birth: 10/31/2014  Date:  11/01/2015  Clinical Social Worker Initiating Note:  Erasmo Downer Afreen Siebels, LCSW Date/ Time Initiated:  11/01/15/      Child's Name:  Royal   Legal Guardian:  Mother   Need for Interpreter:  None   Date of Referral:  10/31/15     Reason for Referral:  Current Substance Use/Substance Use During Pregnancy    Referral Source:  Physician   Address:  [place], [place] [address]  Phone number:  [phone]   Household Members:  Self, Minor Children   Natural Supports (not living in the home):  Friends, Immediate Family   Professional Supports:     Employment: Disabled   Type of Work:     Education:   (some college classes through [employer])   Museum/gallery curator Resources:  Medicaid   Other Resources:  [facility]   Cultural/Religious Considerations Which May Impact Care:  None indicated  Strengths:  Ability to meet basic needs , Home prepared for child    Risk Factors/Current Problems:      Cognitive State:  Alert , Able to Concentrate    Mood/Affect:  Blunted    CSW Assessment: CSW consulted due to history of substance abuse and current methadone use. MOB was cooperative but guarded during assessment. She lives in [place] with her 54 month old child. She reports adequate level of family and social support from others. She denies any immediate social needs. She reports that she receives SSDI and receives methadone maintenance from the [facility] in [place] to manage chronic pain issues. 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 MOB reports that she is familiar with the symptoms as her 29 month old baby also experienced symptoms. MOB has no further questions at this time. CSW will continue to follow if further needs arise.   CSW  Plan/Description:  Psychosocial Support and Ongoing Assessment of Needs    Fintan Grater, Casimiro Needle, LCSW 11/01/2015, 3:28 PM

## 2015-11-01 NOTE — Progress Notes (Signed)
Pt was sitting up eating when [facility] arrived. Her cousin was bedside along w/little Royelle. After visiting for a few minutes, she asked for prayer for her baby. Her cousin joined [place]. She was grateful for visit and asked for follow-up visits. Will refer her day CH. Please page if additional support is needed to prior to next visit. Chaplain Ernest Haber, M.Div.   11/01/15 1800  Clinical Encounter Type  Visited With Patient and family together

## 2015-11-01 NOTE — Progress Notes (Signed)
Went in to ask the Patient if the percocets were controlling her pain and she said they were not that she wanted to try the oxycodone without the tylenol in it. [name] notified and aware see orders.

## 2015-11-02 ENCOUNTER — Ambulatory Visit: Payer: Self-pay

## 2015-11-02 DIAGNOSIS — D5700 Hb-SS disease with crisis, unspecified: Secondary | ICD-10-CM | POA: Diagnosis not present

## 2015-11-02 DIAGNOSIS — O9902 Anemia complicating childbirth: Secondary | ICD-10-CM | POA: Diagnosis not present

## 2015-11-02 MED ORDER — METHADONE HCL 5 MG PO TABS: 5.0000 mg | ORAL_TABLET | Freq: Every day | ORAL | Status: DC

## 2015-11-02 MED ORDER — OXYCODONE-ACETAMINOPHEN 5-325 MG PO TABS: 1.0000 | ORAL_TABLET | Freq: Four times a day (QID) | ORAL | Status: DC | PRN

## 2015-11-02 MED ORDER — MEDROXYPROGESTERONE ACETATE 150 MG/ML IM SUSP
150.0000 mg | Freq: Once | INTRAMUSCULAR | Status: AC
  Administered 2015-11-02: 150 mg via INTRAMUSCULAR
  Filled 2015-11-02: qty 1

## 2015-11-02 MED ORDER — IBUPROFEN 600 MG PO TABS: 600.0000 mg | ORAL_TABLET | Freq: Four times a day (QID) | ORAL | Status: DC

## 2015-11-02 MED ORDER — OXYCODONE HCL 15 MG PO TABS: 15.0000 mg | ORAL_TABLET | Freq: Two times a day (BID) | ORAL | Status: DC

## 2015-11-02 NOTE — Care Management Important Message (Signed)
Important Message  Patient Details  Name: CARLYSIA MELNICHUK MRN: [id number] Date of Birth: 09/07/1990   Medicare Important Message Given:  Yes    Shelda Altes 11/02/2015, 10:38 AMImportant Message  Patient Details  Name: SWAPNA GRECH MRN: [id number] Date of Birth: 03-26-1991   Medicare Important Message Given:  Yes    Shelda Altes 11/02/2015, 10:38 AM

## 2015-11-02 NOTE — Lactation Note (Signed)
This note was copied from a baby's chart. Lactation Consultation Note  Patient Name: [name] [id number] Date: 11/02/2015 Reason for consult: Follow-up assessment;NICU baby   Back to mom's room to check on pump status, unable to speak with her as she was speaking with MD and getting into shower. Left Providing Milk for Your Baby in NICU Booklet. Left my number for mom to call me for pump rental      Maternal Data Formula Feeding for Exclusion: No Does the patient have breastfeeding experience prior to this delivery?: Yes  Feeding Feeding Type: Formula Nipple Type: Slow - flow  LATCH Score/Interventions                      Lactation Tools Discussed/Used WIC Program: Yes Pump Review: Setup, frequency, and cleaning (Mom declined having me help set up pump, saying she knows how. Pump kit is in room)   Consult Status Consult Status: Follow-up Date: 11/02/15 Follow-up type: In-patient    Debby Freiberg Suprina [place] 11/02/2015, 10:19 AM

## 2015-11-02 NOTE — Lactation Note (Signed)
This note was copied from a baby's chart. Lactation Consultation Note  Patient Name: [name] [id number] Date: 11/02/2015 Reason for consult: Follow-up assessment;NICU baby   Follow up with mom of 80 hour old infant. Infant is being transferred to NICU this morning for increasing NAS Scores. Mom noted to have 600 cc EBL after delivery. Infant has been receiving bottles of formula. Mom reported to me that she has latched infant to breast a few times and she did ok, although it is not documented in the flow sheet. Mom has not been pumping, she reports her breasts are feeling fuller today. Mom has a pump and pump kit in the room, she is eating breakfast currently and did not want me to show her how to set up the DEBP saying she knows how as she has a 71 month old that was in NICU. Mom reports she was able to pick up her [facility] pump on the way home with her son, she is planning to call [place] this morning to see when she can get in to get a pump. She has my number to call after she talks to [facility]. Discussed with her how WIC pump loaner program works. Mom is aware there a breast pumps in NICU for use also. Advised mom to ask for breast milk labels when visiting in NICU.    Maternal Data Formula Feeding for Exclusion: No Does the patient have breastfeeding experience prior to this delivery?: Yes  Feeding Feeding Type: Formula Nipple Type: Slow - flow  LATCH Score/Interventions                      Lactation Tools Discussed/Used WIC Program: Yes Pump Review: Setup, frequency, and cleaning (Mom declined having me help set up pump, saying she knows how. Pump kit is in room)   Consult Status Consult Status: Follow-up Date: 11/02/15 Follow-up type: In-patient    Debby Freiberg Saliyah Gillin 11/02/2015, 8:55 AM

## 2015-11-02 NOTE — Progress Notes (Signed)
SICKLE CELL SERVICE PROGRESS NOTE  [name] [id number] DOB: August 29, 1990 DOA: 10/26/2015 PCP: Angelica Chessman, MD  Assessment/Plan: Principal Problem:   Vaginal delivery Active Problems:   Sickle cell disease ([place])   Maternal sickle cell anemia affecting pregnancy, antepartum ([place])   Herpes simplex type 2 (HSV-2) infection affecting pregnancy, antepartum   Positive GBS test   Sickle cell pain crisis ([place])   Hb-SS disease with crisis ([place])  1. Hb SS with crisis: Continue on PCA and clinician assisted doses at this time. Pt to be induced today. 2. Leukocytosis: No evidence of infection. May be associated with peripartum condition. Would recheck after delivery. 3. Anemia of chronic disease: The patient has a hemoglobin of 7.7  which is essentially at the target hemoglobin of 8 recommended in a pregnant patient with sickle cell. Would not transfuse at this time. 4. Chronic pain: Continue methadone 5 mg daily at bedtime. 5. Third trimester pregnancy: Patient is 14 + weeks gestation and is to be induced today. Will defer to Ob-Gyn.   Code Status: Full Code Family Communication: N/A Disposition Plan: Not yet ready for discharge  [place].  Pager [phone]. If 7PM-7AM, please contact night-coverage.  11/02/2015, 10:05 AM  LOS: 6 days   Interim History: Patient reports that she's excited about her delivery today. She feels a little bit better today and rates her pain as 7-8/10 and localized to left shoulder and upper back.    Procedures:  None   Antibiotics:  None   Objective: Filed Vitals:   11/01/15 0537 11/01/15 1259 11/01/15 1804 11/02/15 0600  BP:  140/90 96/53 121/73  Pulse:  106 95 90  Temp:  98.6 F (37 C) 99.4 F (37.4 C) 98.1 F (36.7 C)  TempSrc:  Oral Oral Oral  Resp: 17 18 20 18   Height:      Weight:      SpO2:  96% 97%    Weight change:  No intake or output data in the 24 hours ending 11/02/15 1005     General: Alert, awake,  oriented x3, in no apparent distress.  HEENT: [place]/AT PEERL, EOMI, anicteric Neck: Trachea midline,  no masses, no thyromegal,y no JVD, no carotid bruit OROPHARYNX:  Moist, No exudate/ erythema/lesions.  Heart: Regular rate and rhythm, without murmurs, rubs, gallops, PMI non-displaced, no heaves or thrills on palpation.  Lungs: Clear to auscultation, no wheezing or rhonchi noted. No increased vocal fremitus resonant to percussion  Abdomen: Gravid Neuro: No focal neurological deficits noted cranial nerves II through XII grossly intact. Strength  in bilateral upper and lower extremities. Musculoskeletal: No warm swelling or erythema around joints, no spinal tenderness noted. Psychiatric: Patient alert and oriented x3, good insight and cognition, good recent to remote recall.    Data Reviewed: Basic Metabolic Panel:  Recent Labs Lab 10/27/15 0015 10/28/15 1320 10/30/15 1310  NA  --  132* 134*  K  --  4.7 4.0  CL  --  106 103  CO2  --  23 25  GLUCOSE  --  401* 83  BUN  --  <5* <5*  CREATININE 0.46 0.58 0.35*  CALCIUM  --  8.0* 8.7*   Liver Function Tests:  Recent Labs Lab 10/28/15 1320  AST 29  ALT 47  ALKPHOS 138*  BILITOT 2.3*  PROT 5.7*  ALBUMIN 2.9*   No results for input(s): LIPASE, AMYLASE in the last 168 hours. No results for input(s): AMMONIA in the last 168 hours. CBC:  Recent Labs Lab  10/26/15 1700 10/28/15 1320 10/30/15 1310 11/01/15 0500  WBC 17.5* 15.1* 15.5* 22.4*  NEUTROABS 14.0* 11.4* 12.2*  --   HGB 9.6* 7.9* 8.5* 7.7*  HCT 27.3* 23.0* 24.7* 22.6*  MCV 86.9 88.5 88.5 87.6  PLT 459* 358 401* 372   Cardiac Enzymes: No results for input(s): CKTOTAL, CKMB, CKMBINDEX, TROPONINI in the last 168 hours. BNP (last 3 results) No results for input(s): BNP in the last 8760 hours.  ProBNP (last 3 results) No results for input(s): PROBNP in the last 8760 hours.  CBG:  Recent Labs Lab 10/29/15 0849  GLUCAP 102*    No results found for this or any  previous visit (from the past 240 hour(s)).   Studies: Ir Fluoro Guide Cv Line Right  10/16/2015  CLINICAL DATA:  Sickle cell anemia with crisis during pregnancy. Poor IV access and need for PICC line. EXAM: POWER PICC LINE PLACEMENT WITH ULTRASOUND AND FLUOROSCOPIC GUIDANCE FLUOROSCOPY TIME:  54 seconds. PROCEDURE: The patient was advised of the possible risks and complications and agreed to undergo the procedure. The patient was then brought to the angiographic suite for the procedure. The right arm was prepped with chlorhexidine, draped in the usual sterile fashion using maximum barrier technique (cap and mask, sterile gown, sterile gloves, large sterile sheet, hand hygiene and cutaneous antisepsis) and infiltrated locally with 1% Lidocaine. Ultrasound demonstrated patency of the right brachial vein, and this was documented with an image. Under real-time ultrasound guidance, this vein was accessed with a 21 gauge micropuncture needle and image documentation was performed. A 0.018 wire was introduced in to the vein. Over this, a 5.0 [place] dual lumen power injectable PICC was advanced to the lower SVC/right atrial junction. Fluoroscopy during the procedure and fluoro spot radiograph confirms appropriate catheter position. The catheter was flushed and covered with a sterile dressing. Catheter length: 33 cm COMPLICATIONS: None IMPRESSION: Successful right arm power injectable PICC line placement with ultrasound and fluoroscopic guidance. The catheter is ready for use. Electronically Signed   By: Aletta Edouard M.D.   On: 10/16/2015 15:43   Ir US Guide Vasc Access Right  10/16/2015  CLINICAL DATA:  Sickle cell anemia with crisis during pregnancy. Poor IV access and need for PICC line. EXAM: POWER PICC LINE PLACEMENT WITH ULTRASOUND AND FLUOROSCOPIC GUIDANCE FLUOROSCOPY TIME:  54 seconds. PROCEDURE: The patient was advised of the possible risks and complications and agreed to undergo the procedure. The patient  was then brought to the angiographic suite for the procedure. The right arm was prepped with chlorhexidine, draped in the usual sterile fashion using maximum barrier technique (cap and mask, sterile gown, sterile gloves, large sterile sheet, hand hygiene and cutaneous antisepsis) and infiltrated locally with 1% Lidocaine. Ultrasound demonstrated patency of the right brachial vein, and this was documented with an image. Under real-time ultrasound guidance, this vein was accessed with a 21 gauge micropuncture needle and image documentation was performed. A 0.018 wire was introduced in to the vein. Over this, a 5.0 [place] dual lumen power injectable PICC was advanced to the lower SVC/right atrial junction. Fluoroscopy during the procedure and fluoro spot radiograph confirms appropriate catheter position. The catheter was flushed and covered with a sterile dressing. Catheter length: 33 cm COMPLICATIONS: None IMPRESSION: Successful right arm power injectable PICC line placement with ultrasound and fluoroscopic guidance. The catheter is ready for use. Electronically Signed   By: Aletta Edouard M.D.   On: 10/16/2015 15:43   [place] Mfm Fetal Bpp Wo Non Stress  10/29/2015  OBSTETRICAL ULTRASOUND: This exam was performed within a [place] [facility] Department. The OB US report was generated in the AS system, and faxed to the ordering physician.  This report is available in the [facility]. See the AS Obstetric US report via the Image Link.  [place] Mfm Fetal Bpp Wo Non Stress  10/20/2015  OBSTETRICAL ULTRASOUND: This exam was performed within a [place] Ul[facility]ound Department. The OB US report was generated in the AS system, and faxed to the ordering physician.  This report is available in the [facility]. See the AS Obstetric US report via the Image Link.  [place] Mfm Fetal Bpp Wo Non Stress  10/13/2015  OBSTETRICAL ULTRASOUND: This exam was performed within a [place] Ultrasound Department. The OB US report was  generated in the AS system, and faxed to the ordering physician.  This report is available in the [facility]. See the AS Obstetric US report via the Image Link.  [place] Mfm Fetal Bpp Wo Non Stress  10/06/2015  OBSTETRICAL ULTRASOUND: This exam was performed within a [place] Ultrasound Department. The OB US report was generated in the AS system, and faxed to the ordering physician.  This report is available in the [facility]. See the AS Obstetric US report via the Image Link.  [place] Mfm Ob Follow Up  10/13/2015  OBSTETRICAL ULTRASOUND: This exam was performed within a [facility]. The OB US report was generated in the AS system, and faxed to the ordering physician.  This report is available in the [facility]. See the AS Obstetric US report via the Image Link.  [place] Mfm Ob Limited  10/27/2015  OBSTETRICAL ULTRASOUND: This exam was performed within a [place] Ultrasound Department. The OB US report was generated in the AS system, and faxed to the ordering physician.  This report is available in the [facility]. See the AS Obstetric US report via the Image Link.   Scheduled Meds: . ferrous sulfate  325 mg Oral BID WC  . ibuprofen  600 mg Oral Q6H  . measles, mumps and rubella vaccine  0.5 mL Subcutaneous Once  . medroxyPROGESTERone  150 mg Intramuscular Once  . methadone  5 mg Oral Daily  . prenatal multivitamin  1 tablet Oral Q1200  . senna-docusate  2 tablet Oral Q24H  . sodium chloride flush  10 mL Intracatheter Q12H  . sodium chloride flush  10 mL Intravenous Q12H  . Tdap  0.5 mL Intramuscular Once   Continuous Infusions: . sodium chloride 125 mL/hr at 11/01/15 2118    Time spent 25 minutes.

## 2015-11-02 NOTE — Discharge Instructions (Signed)
Postpartum Care After Vaginal Delivery After you deliver your newborn (postpartum period), the usual stay in the hospital is 24-72 hours. If there were problems with your labor or delivery, or if you have other medical problems, you might be in the hospital longer.  While you are in the hospital, you will receive help and instructions on how to care for yourself and your newborn during the postpartum period.  While you are in the hospital: Be sure to tell your nurses if you have pain or discomfort, as well as where you feel the pain and what makes the pain worse. If you had an incision made near your vagina (episiotomy) or if you had some tearing during delivery, the nurses may put ice packs on your episiotomy or tear. The ice packs may help to reduce the pain and swelling. If you are breastfeeding, you may feel uncomfortable contractions of your uterus for a couple of weeks. This is normal. The contractions help your uterus get back to normal size. It is normal to have some bleeding after delivery. For the first 1-3 days after delivery, the flow is red and the amount may be similar to a period. It is common for the flow to start and stop. In the first few days, you may pass some small clots. Let your nurses know if you begin to pass large clots or your flow increases. Do not  flush blood clots down the toilet before having the nurse look at them. During the next 3-10 days after delivery, your flow should become more watery and pink or brown-tinged in color. Ten to fourteen days after delivery, your flow should be a small amount of yellowish-white discharge. The amount of your flow will decrease over the first few weeks after delivery. Your flow may stop in 6-8 weeks. Most women have had their flow stop by 12 weeks after delivery. You should change your sanitary pads frequently. Wash your hands thoroughly with soap and water for at least 20 seconds after changing pads, using the toilet, or before  holding or feeding your newborn. You should feel like you need to empty your bladder within the first 6-8 hours after delivery. In case you become weak, lightheaded, or faint, call your nurse before you get out of bed for the first time and before you take a shower for the first time. Within the first few days after delivery, your breasts may begin to feel tender and full. This is called engorgement. Breast tenderness usually goes away within 48-72 hours after engorgement occurs. You may also notice milk leaking from your breasts. If you are not breastfeeding, do not stimulate your breasts. Breast stimulation can make your breasts produce more milk. Spending as much time as possible with your newborn is very important. During this time, you and your newborn can feel close and get to know each other. Having your newborn stay in your room (rooming in) will help to strengthen the bond with your newborn. It will give you time to get to know your newborn and become comfortable caring for your newborn. Your hormones change after delivery. Sometimes the hormone changes can temporarily cause you to feel sad or tearful. These feelings should not last more than a few days. If these feelings last longer than that, you should talk to your caregiver. If desired, talk to your caregiver about methods of family planning or contraception. Talk to your caregiver about immunizations. Your caregiver may want you to have the following immunizations before leaving the  hospital: Tetanus, diphtheria, and pertussis (Tdap) or tetanus and diphtheria (Td) immunization. It is very important that you and your family (including grandparents) or others caring for your newborn are up-to-date with the Tdap or Td immunizations. The Tdap or Td immunization can help protect your newborn from getting ill. Rubella immunization. Varicella (chickenpox) immunization. Influenza immunization. You should receive this annual immunization if you did  not receive the immunization during your pregnancy.   This information is not intended to replace advice given to you by your health care provider. Make sure you discuss any questions you have with your health care provider.   Document Released: 02/27/2007 Document Revised: 01/25/2012 Document Reviewed: 12/28/2011 Elsevier Interactive Patient Education 2016 Elsevier Inc.  Chronic Pain Chronic pain can be defined as pain that is off and on and lasts for 3-6 months or longer. Many things cause chronic pain, which can make it difficult to make a diagnosis. There are many treatment options available for chronic pain. However, finding a treatment that works well for you may require trying various approaches until the right one is found. Many people benefit from a combination of two or more types of treatment to control their pain. SYMPTOMS  Chronic pain can occur anywhere in the body and can range from mild to very severe. Some types of chronic pain include:  Headache.  Low back pain.  Cancer pain.  Arthritis pain.  Neurogenic pain. This is pain resulting from damage to nerves. People with chronic pain may also have other symptoms such as:  Depression.  Anger.  Insomnia.  Anxiety. DIAGNOSIS  Your health care provider will help diagnose your condition over time. In many cases, the initial focus will be on excluding possible conditions that could be causing the pain. Depending on your symptoms, your health care provider may order tests to diagnose your condition. Some of these tests may include:   Blood tests.   CT scan.   MRI.   X-rays.   Ultrasounds.   Nerve conduction studies.  You may need to see a specialist.  TREATMENT  Finding treatment that works well may take time. You may be referred to a pain specialist. He or she may prescribe medicine or therapies, such as:   Mindful meditation or yoga.  Shots (injections) of numbing or pain-relieving medicines into the  spine or area of pain.  Local electrical stimulation.  Acupuncture.   Massage therapy.   Aroma, color, light, or sound therapy.   Biofeedback.   Working with a physical therapist to keep from getting stiff.   Regular, gentle exercise.   Cognitive or behavioral therapy.   Group support.  Sometimes, surgery may be recommended.  HOME CARE INSTRUCTIONS   Take all medicines as directed by your health care provider.   Lessen stress in your life by relaxing and doing things such as listening to calming music.   Exercise or be active as directed by your health care provider.   Eat a healthy diet and include things such as vegetables, fruits, fish, and lean meats in your diet.   Keep all follow-up appointments with your health care provider.   Attend a support group with others suffering from chronic pain. SEEK MEDICAL CARE IF:   Your pain gets worse.   You develop a new pain that was not there before.   You cannot tolerate medicines given to you by your health care provider.   You have new symptoms since your last visit with your health care provider.  SEEK IMMEDIATE MEDICAL CARE IF:   You feel weak.   You have decreased sensation or numbness.   You lose control of bowel or bladder function.   Your pain suddenly gets much worse.   You develop shaking.  You develop chills.  You develop confusion.  You develop chest pain.  You develop shortness of breath.  MAKE SURE YOU:  Understand these instructions.  Will watch your condition.  Will get help right away if you are not doing well or get worse.   This information is not intended to replace advice given to you by your health care provider. Make sure you discuss any questions you have with your health care provider.   Document Released: 01/22/2002 Document Revised: 01/02/2013 Document Reviewed: 10/26/2012 Elsevier Interactive Patient Education 2016 [employer].  Opioid Use  Disorder Opioid use disorder is a mental disorder. It is the continued nonmedical use of opioids in spite of risks to health and well-being. Misused opioids include the street drug heroin. They also include pain medicines such as morphine, hydrocodone, oxycodone, and fentanyl. Opioids are very addictive. People who misuse opioids get an exaggerated feeling of well-being. Opioid use disorder often disrupts activities at home, work, or school. It may cause mental or physical problems.  A family history of opioid use disorder puts you at higher risk of it. People with opioid use disorder often misuse other drugs or have mental illness such as depression, posttraumatic stress disorder, or antisocial personality disorder. They also are at risk of suicide and death from overdose. SIGNS AND SYMPTOMS  Signs and symptoms of opioid use disorder include:  Use of opioids in larger amounts or over a longer period than intended.  Unsuccessful attempts to cut down or control opioid use.  A lot of time spent obtaining, using, or recovering from the effects of opioids.  A strong desire or urge to use opioids (craving).  Continued use of opioids in spite of major problems at work, school, or home because of use.  Continued use of opioids in spite of relationship problems because of use.  Giving up or cutting down on important life activities because of opioid use.  Use of opioids over and over in situations when it is physically hazardous, such as driving a car.  Continued use of opioids in spite of a physical problem that is likely related to use. Physical problems can include:  Severe constipation.  Poor nutrition.  Infertility.  Tuberculosis.  Aspiration pneumonia.  Infections such as human immunodeficiency virus (HIV) and hepatitis (from injecting opioids).  Continued use of opioids in spite of a mental problem that is likely related to use. Mental problems can  include:  Depression.  Anxiety.  Hallucinations.  Sleep problems.  Loss of sexual function.  Need to use more and more opioids to get the same effect, or lessened effect over time with use of the same amount (tolerance).  Having withdrawal symptoms when opioid use is stopped, or using opioids to reduce or avoid withdrawal symptoms. Withdrawal symptoms include:  Depressed, anxious, or irritable mood.  Nausea, vomiting, diarrhea, or intestinal cramping.  Muscle aches or spasms.  Excessive tearing or runny nose.  Dilated pupils, sweating, or hairs standing on end.  Yawning.  Fever, raised blood pressure, or fast pulse.  Restlessness or trouble sleeping. This does not apply to people taking opioids for medical reasons only. DIAGNOSIS Opioid use disorder is diagnosed by your health care provider. You may be asked questions about your opioid use and  and how it affects your life. A physical exam may be done. A drug screen may be ordered. You may be referred to a mental health professional. The diagnosis of opioid use disorder requires at least two symptoms within 12 months. The type of opioid use disorder you have depends on the number of signs and symptoms you have. The type may be:  Mild. Two or three signs and symptoms.   Moderate. Four or five signs and symptoms.   Severe. Six or more signs and symptoms. TREATMENT  Treatment is usually provided by mental health professionals with training in substance use disorders.The following options are available:  Detoxification.This is the first step in treatment for withdrawal. It is medically supervised withdrawal with the use of medicines. These medicines lessen withdrawal symptoms. They also raise the chance of becoming opioid free.  Counseling, also known as talk therapy. Talk therapy addresses the reasons you use opioids. It also addresses ways to keep you from using again (relapse). The goals of talk therapy are to avoid  relapse by:  Identifying and avoiding triggers for use.  Finding healthy ways to cope with stress.  Learning how to handle cravings.  Support groups. Support groups provide emotional support, advice, and guidance.  A medicine that blocks opioid receptors in your brain. This medicine can reduce opioid cravings that lead to relapse. This medicine also blocks the desired opioid effect when relapse occurs.  Opioids that are taken by mouth in place of the misused opioid (opioid maintenance treatment). These medicines satisfy cravings but are safer than commonly misused opioids. This often is the best option for people who continue to relapse with other treatments. HOME CARE INSTRUCTIONS   Take medicines only as directed by your health care provider.  Check with your health care provider before starting new medicines.  Keep all follow-up visits as directed by your health care provider. SEEK MEDICAL CARE IF:  You are not able to take your medicines as directed.  Your symptoms get worse. SEEK IMMEDIATE MEDICAL CARE IF:  You have serious thoughts about hurting yourself or others.  You may have taken an overdose of opioids. [place] on Drug Abuse: motorcyclefax.com  Substance Abuse and Mental Health Services Administration: ktimeonline.com   This information is not intended to replace advice given to you by your health care provider. Make sure you discuss any questions you have with your health care provider.   Document Released: 02/27/2007 Document Revised: 05/23/2014 Document Reviewed: 05/15/2013 Elsevier Interactive Patient Education [employer].

## 2015-11-02 NOTE — Discharge Summary (Signed)
OB Discharge Summary     Patient Name: [name] DOB: May 24, 1990 MRN: [id number]  Date of admission: 10/26/2015 Delivering MD: Manya Silvas   Date of discharge: 11/02/2015  Admitting diagnosis: 38w sickel cell crisis Intrauterine pregnancy: [redacted]w[redacted]d     Secondary diagnosis:  Principal Problem:   Vaginal delivery Active Problems:   Sickle cell disease ([place])   Maternal sickle cell anemia affecting pregnancy, antepartum ([place])   Herpes simplex type 2 (HSV-2) infection affecting pregnancy, antepartum   Positive GBS test   Sickle cell pain crisis ([place])   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 yo [id number] at [redacted]w[redacted]d was admitted to the hospital 10/26/2015 for a sickle cell pain crisis and then stayed in the hospital until her scheduled induction of labor on 6/16.  Indication for induction: Sickle Cell disease.   At the time of induction the infant was found to be breech and the team performed a successful External Cephalic Version.  Patient had an uncomplicated labor course as follows: Membrane Rupture Time/Date: 3:15 PM ,10/31/2015   Intrapartum Procedures: Episiotomy: None [1]                                         Lacerations:  None [1]  Patient had delivery of a Viable infant.  Information for the patient's newborn:  [name], [name] [id number]  Delivery Method: Vaginal, Spontaneous Delivery (Filed from Delivery Summary)    10/31/2015. Details of delivery can be found in separate delivery note.  Of note the patient had early placement of an epidural and additionally had a dilaudid PCA that were cross titrated for pain.  She had reported sickle cell  pain in her left shoulder prior to induction. Patient had a routine postpartum course except for issues with pain management. The patient was follows by the hematology group throughout her stay. pn PPD#1 the patient was found to be changing her pump setting on there dilaudid PCA and this was discontinued and changed to oral medication. She was doing well from a post delivery standpoint. She was given Oxycodone 15mg  #10 tabs and Methadone 5mg  #5 tabs to bridger her to her appointment on 6/21. We discussed that further narcotic medications will be prescribed by her hematology team. Patient is discharged home 11/02/2015.   Physical exam  Filed Vitals:   11/01/15 0537 11/01/15 1259 11/01/15 1804 11/02/15 0600  BP:  140/90 96/53 121/73  Pulse:  106 95 90  Temp:  98.6 F (37 C) 99.4 F (37.4 C) 98.1 F (36.7 C)  TempSrc:  Oral Oral Oral  Resp: 17 18 20 18   Height:  Weight:      SpO2:  96% 97%    General: alert, cooperative and no distress Lochia: appropriate Uterine Fundus: firm Incision: N/A DVT Evaluation: No evidence of DVT seen on physical exam. Negative Homan's sign. Labs: Lab Results  Component Value Date   WBC 22.4* 11/01/2015   HGB 7.7* 11/01/2015   HCT 22.6* 11/01/2015   MCV 87.6 11/01/2015   PLT 372 11/01/2015   CMP Latest Ref Rng 10/30/2015  Glucose 65 - 99 mg/dL 83  BUN 6 - 20 mg/dL <5(L)  Creatinine 0.44 - 1.00 mg/dL 0.35(L)  Sodium 135 - 145 mmol/L 134(L)  Potassium 3.5 - 5.1 mmol/L 4.0  Chloride 101 - 111 mmol/L 103  CO2 22 - 32 mmol/L 25  Calcium 8.9 - 10.3 mg/dL 8.7(L)  Total Protein 6.5 - 8.1 g/dL -  Total Bilirubin 0.3 - 1.2 mg/dL -  Alkaline Phos 38 - 126 U/L -  AST 15 - 41 U/L -  ALT 14 - 54 U/L -    Discharge instruction: per After Visit Summary and "Baby and Me Booklet".  After visit meds:    Medication List    TAKE these medications        calcium carbonate 500 MG chewable tablet  Commonly known as:  TUMS - dosed in mg elemental calcium   Chew 2 tablets by mouth 2 (two) times daily as needed for indigestion or heartburn.     folic acid 1 MG tablet  Commonly known as:  FOLVITE  Take 1 mg by mouth daily.     ibuprofen 600 MG tablet  Commonly known as:  ADVIL,MOTRIN  Take 1 tablet (600 mg total) by mouth every 6 (six) hours.     methadone 5 MG tablet  Commonly known as:  DOLOPHINE  Take 1 tablet (5 mg total) by mouth daily.     methadone 5 MG tablet  Commonly known as:  DOLOPHINE  Take 1 tablet (5 mg total) by mouth daily.     oxyCODONE 15 MG immediate release tablet  Commonly known as:  ROXICODONE  Take 1 tablet (15 mg total) by mouth 2 (two) times daily.     prenatal multivitamin Tabs tablet  Take 1 tablet by mouth daily at 12 noon.        Diet: routine diet  Activity: Advance as tolerated. Pelvic rest for 6 weeks.   Outpatient follow up:4 weeks Follow up Appt: Future Appointments Date Time Provider [place]  11/04/2015 10:30 AM Dorena Dew, FNP [facility] None  12/07/2015 1:20 PM Mora Bellman, MD [facility]   Postpartum contraception: Depo Provera bridge to IUD placement in 4-6 weeks (desires LNG-IUD)  Newborn Data: Live born female  Birth Weight: 6 lb 15.8 oz (3170 g) APGAR: 8, 9  Baby Feeding: Breast Disposition:NICU for NAS   11/02/2015 Caren Macadam, MD

## 2015-11-02 NOTE — Lactation Note (Signed)
This note was copied from a baby's chart. Lactation Consultation Note  Patient Name: [name] [id number] Date: 11/02/2015   Follow up with mom in NICU. Mom reports she has not called WIC. She has my Phone # to call if she would like a pump rental today.    Maternal Data    Feeding Feeding Type: Formula Nipple Type: Slow - flow  LATCH Score/Interventions                      Lactation Tools Discussed/Used     Consult Status      Donn Pierini 11/02/2015, 1:38 PM

## 2015-11-03 ENCOUNTER — Ambulatory Visit (HOSPITAL_COMMUNITY): Admission: RE | Admit: 2015-11-03 | Payer: Medicare Other | Source: Ambulatory Visit

## 2015-11-03 ENCOUNTER — Other Ambulatory Visit: Payer: Self-pay | Admitting: Internal Medicine

## 2015-11-03 LAB — TYPE AND SCREEN
ABO/RH(D): B POS
Antibody Screen: NEGATIVE
DONOR AG TYPE: NEGATIVE
Donor AG Type: NEGATIVE
UNIT DIVISION: 0
UNIT DIVISION: 0
UNIT TAG COMMENT: NEGATIVE
UNIT TAG COMMENT: NEGATIVE

## 2015-11-04 ENCOUNTER — Ambulatory Visit: Payer: Medicare Other | Admitting: Family Medicine

## 2015-11-05 ENCOUNTER — Ambulatory Visit: Payer: Self-pay

## 2015-11-05 NOTE — Lactation Note (Signed)
This note was copied from a baby's chart. Lactation Consultation Note  Patient Name: [name] [id number] Date: 11/05/2015 Reason for consult: Follow-up assessment Baby at 5 days of life and mom is being D/C. She is a current [facility] client and has an apt on 11/18/15. She was issued a [employer] to be returned 11/19/15. All paper work and money were turned into the office. Reviewed milk handling/storage and pumping frequency. Demonstrated manual pump. She is aware of lactation services and support group. She will call as needed.   Maternal Data    Feeding Feeding Type: Breast Milk Nipple Type: Slow - flow Length of feed: 30 min  LATCH Score/Interventions                      Lactation Tools Discussed/Used Pump Review: Setup, frequency, and cleaning;Milk Storage Initiated by:: ES Date initiated:: 11/05/15   Consult Status Consult Status: PRN    Denzil Hughes 11/05/2015, 8:29 PM

## 2015-11-06 ENCOUNTER — Ambulatory Visit (HOSPITAL_COMMUNITY)
Admission: RE | Admit: 2015-11-06 | Discharge: 2015-11-06 | Disposition: A | Discharge status: Home / Self Care | Payer: Medicare Other | Source: Ambulatory Visit | Attending: Internal Medicine | Admitting: Internal Medicine

## 2015-11-06 DIAGNOSIS — D571 Sickle-cell disease without crisis: Secondary | ICD-10-CM | POA: Diagnosis not present

## 2015-11-06 NOTE — Progress Notes (Signed)
Pt discharged home with instructions; discharge instructions explained, given, and signed; all questions answered; pt alert, oriented, and ambulatory; no apparent distress; accompanied by family member and child; no complications noted

## 2015-11-06 NOTE — Progress Notes (Signed)
Pt arrived at day hospital with double lumen PICC line in place; dressing not intact; MD notified; order received for removal of PICC line; PICC line removed per policy; pt to remain for 30 minutes post removal; pt verbalizes understanding; will continue to monitor

## 2015-11-06 NOTE — Discharge Instructions (Signed)
PICC Removal A peripherally inserted central catheter (PICC) is a long, thin, flexible tube that a health care provider can insert into a vein in your upper arm. It is a type of IV. Having a PICC in place gives health care providers quick access to your veins. It is a good way to distribute medicines and fluids quickly throughout your body. LET YOUR HEALTH CARE PROVIDER KNOW ABOUT:  Any allergies you have.  All medicines you are taking, including vitamins, herbs, eye drops, creams, and over-the-counter medicines.  Previous problems you or members of your family have had with the use of anesthetics.  Any blood disorders you have.  Previous surgeries you have had.  Medical conditions you have. RISKS AND COMPLICATIONS Generally, this is a safe procedure. However, as with any procedure, problems can occur. Possible problems include:  Bleeding.  Infection. BEFORE THE PROCEDURE You need an order from your health care provider to have your PICC removed. Only a health care provider trained in PICC removal should take it out.You may have your PICC removed in the hospital or in an outpatient setting. PROCEDURE Having a PICC removed is usually painless. Removal of the tape that holds the PICC in place may be the most uncomfortable part. Do not take out the PICC yourself. Only a trained clinical professional, such as a PICC nurse, should remove it. If your health care provider thinks your PICC is infected, the tip may be sent to the lab for testing. After taking out your PICC, your health care provider may:   Hold gentle pressure on the exit site.  Apply some antibiotic ointment.  Place a small bandage over the insertion site. AFTER THE PROCEDURE You should be able to remove the bandage after 24 hours. Follow all your health care provider's instructions.   Keep the insertion site clean by washing it gently with soap and water.  Do not pick or remove a scab.  Avoid strenuous physical  activity for a day or two.  Let your health care provider know if you develop redness, soreness, bleeding, swelling, or drainage from the insertion site.  Let your health care provider know if you develop chills or fever.   This information is not intended to replace advice given to you by your health care provider. Make sure you discuss any questions you have with your health care provider.   Document Released: 10/20/2009 Document Revised: 09/16/2014 Document Reviewed: 02/22/2013 Elsevier Interactive Patient Education 2016 Elsevier Inc.  

## 2015-11-07 ENCOUNTER — Ambulatory Visit: Payer: Self-pay

## 2015-11-07 NOTE — Lactation Note (Signed)
This note was copied from a baby's chart. Lactation Consultation Note  Patient Name: [name] [id number] Date: 11/07/2015 Reason for consult: Follow-up assessment   Follow up with mom and baby on 3rd floor. Infant roomed in with mom last night. Mom reports she has tried to latch infant to breast and infant doesn't do too well. She reports she is pumping every 3 hours and getting up to 120 cc EBM. She reports she is not giving it to the infant so she can store it. She reports she does not want to give formula but has over 13 bottles stored. Enc her to give infant pumped EBM as available.   She did mention that she does not have her prescription filled for her Oxycodone. Did discuss with her that high doses of pain meds may not be best for the baby and if she has to resume medications for Sickle cell to please call us to discuss, she voiced understanding.   Mom agreed to assistance with latch prior to d/c. Left my number for mom to call with assistance with latch at next feeding. Infant currently sleeping.   Mom has a [facility] loaner pump at home and has a [facility] appt the first week of July to get a pump. Enc mom to call [place] with questions/concerns prn.   Maternal Data    Feeding Feeding Type: Bottle Fed - Formula Nipple Type: Slow - flow  LATCH Score/Interventions                      Lactation Tools Discussed/Used WIC Program: Yes Pump Review: Setup, frequency, and cleaning;Milk Storage   Consult Status Consult Status: Complete Date: 11/07/15 Follow-up type: Call as needed    Debby Freiberg [name] 11/07/2015, 11:12 AM

## 2015-11-08 ENCOUNTER — Other Ambulatory Visit: Payer: Self-pay

## 2015-11-08 ENCOUNTER — Emergency Department (HOSPITAL_COMMUNITY): Payer: Medicare Other

## 2015-11-08 ENCOUNTER — Encounter (HOSPITAL_COMMUNITY): Payer: Self-pay | Admitting: *Deleted

## 2015-11-08 ENCOUNTER — Inpatient Hospital Stay (HOSPITAL_COMMUNITY)
Admission: EM | Admit: 2015-11-08 | Discharge: 2015-11-12 | DRG: 776 | Disposition: A | Discharge status: Home / Self Care | Payer: Medicare Other | Attending: Internal Medicine | Admitting: Internal Medicine

## 2015-11-08 DIAGNOSIS — R509 Fever, unspecified: Secondary | ICD-10-CM

## 2015-11-08 DIAGNOSIS — A419 Sepsis, unspecified organism: Secondary | ICD-10-CM | POA: Diagnosis present

## 2015-11-08 DIAGNOSIS — O85 Puerperal sepsis: Secondary | ICD-10-CM | POA: Diagnosis not present

## 2015-11-08 DIAGNOSIS — D7589 Other specified diseases of blood and blood-forming organs: Secondary | ICD-10-CM | POA: Diagnosis not present

## 2015-11-08 DIAGNOSIS — R17 Unspecified jaundice: Secondary | ICD-10-CM | POA: Diagnosis present

## 2015-11-08 DIAGNOSIS — D473 Essential (hemorrhagic) thrombocythemia: Secondary | ICD-10-CM | POA: Diagnosis not present

## 2015-11-08 DIAGNOSIS — R079 Chest pain, unspecified: Secondary | ICD-10-CM | POA: Diagnosis not present

## 2015-11-08 DIAGNOSIS — Z87891 Personal history of nicotine dependence: Secondary | ICD-10-CM | POA: Diagnosis not present

## 2015-11-08 DIAGNOSIS — R0602 Shortness of breath: Secondary | ICD-10-CM | POA: Diagnosis not present

## 2015-11-08 DIAGNOSIS — F53 Puerperal psychosis: Secondary | ICD-10-CM | POA: Diagnosis not present

## 2015-11-08 DIAGNOSIS — N939 Abnormal uterine and vaginal bleeding, unspecified: Secondary | ICD-10-CM | POA: Diagnosis present

## 2015-11-08 DIAGNOSIS — D5700 Hb-SS disease with crisis, unspecified: Secondary | ICD-10-CM | POA: Diagnosis present

## 2015-11-08 DIAGNOSIS — D75839 Thrombocytosis, unspecified: Secondary | ICD-10-CM | POA: Diagnosis present

## 2015-11-08 LAB — URINE MICROSCOPIC-ADD ON: Bacteria, UA: NONE SEEN

## 2015-11-08 LAB — URINALYSIS, ROUTINE W REFLEX MICROSCOPIC
BILIRUBIN URINE: NEGATIVE
GLUCOSE, UA: NEGATIVE mg/dL
KETONES UR: NEGATIVE mg/dL
Nitrite: NEGATIVE
PH: 6.5 (ref 5.0–8.0)
PROTEIN: NEGATIVE mg/dL
Specific Gravity, Urine: 1.015 (ref 1.005–1.030)

## 2015-11-08 LAB — COMPREHENSIVE METABOLIC PANEL
ALBUMIN: 3.7 g/dL (ref 3.5–5.0)
ALK PHOS: 89 U/L (ref 38–126)
ALT: 21 U/L (ref 14–54)
ANION GAP: 6 (ref 5–15)
AST: 23 U/L (ref 15–41)
BUN: 9 mg/dL (ref 6–20)
CALCIUM: 9 mg/dL (ref 8.9–10.3)
CHLORIDE: 107 mmol/L (ref 101–111)
CO2: 22 mmol/L (ref 22–32)
Creatinine, Ser: 0.64 mg/dL (ref 0.44–1.00)
GFR calc non Af Amer: >60 mL/min (ref >60)
GLUCOSE: 88 mg/dL (ref 65–99)
Potassium: 3.9 mmol/L (ref 3.5–5.1)
SODIUM: 135 mmol/L (ref 135–145)
Total Bilirubin: 2.7 mg/dL — ABNORMAL HIGH (ref 0.3–1.2)
Total Protein: 7.5 g/dL (ref 6.5–8.1)

## 2015-11-08 LAB — CBC WITH DIFFERENTIAL/PLATELET
BASOS PCT: 0 %
Basophils Absolute: 0 10*3/uL (ref 0.0–0.1)
EOS ABS: 0 10*3/uL (ref 0.0–0.7)
EOS PCT: 0 %
HEMATOCRIT: 24.9 % — AB (ref 36.0–46.0)
HEMOGLOBIN: 8.6 g/dL — AB (ref 12.0–15.0)
LYMPHS PCT: 5 %
Lymphs Abs: 1.4 10*3/uL (ref 0.7–4.0)
MCH: 30.1 pg (ref 26.0–34.0)
MCHC: 34.5 g/dL (ref 30.0–36.0)
MCV: 87.1 fL (ref 78.0–100.0)
Monocytes Absolute: 1.7 10*3/uL — ABNORMAL HIGH (ref 0.1–1.0)
Monocytes Relative: 6 %
NEUTROS ABS: 25.6 10*3/uL — AB (ref 1.7–7.7)
NEUTROS PCT: 89 %
PLATELETS: 633 10*3/uL — AB (ref 150–400)
RBC: 2.86 MIL/uL — ABNORMAL LOW (ref 3.87–5.11)
RDW: 17.9 % — AB (ref 11.5–15.5)
WBC: 28.7 10*3/uL — ABNORMAL HIGH (ref 4.0–10.5)

## 2015-11-08 LAB — RETICULOCYTES
RBC.: 2.82 MIL/uL — ABNORMAL LOW (ref 3.87–5.11)
RETIC CT PCT: 20.4 % — AB (ref 0.4–3.1)
Retic Count, Absolute: 575.3 10*3/uL — ABNORMAL HIGH (ref 19.0–186.0)

## 2015-11-08 MED ORDER — HYDROMORPHONE HCL 1 MG/ML IJ SOLN
1.0000 mg | INTRAMUSCULAR | Status: DC | PRN
  Administered 2015-11-08: 1 mg via INTRAVENOUS
  Filled 2015-11-08: qty 1

## 2015-11-08 MED ORDER — DIPHENHYDRAMINE HCL 50 MG/ML IJ SOLN
25.0000 mg | Freq: Once | INTRAMUSCULAR | Status: AC
  Administered 2015-11-08: 25 mg via INTRAVENOUS
  Filled 2015-11-08: qty 1

## 2015-11-08 MED ORDER — ONDANSETRON HCL 4 MG/2ML IJ SOLN
4.0000 mg | Freq: Once | INTRAMUSCULAR | Status: AC
  Administered 2015-11-08: 4 mg via INTRAVENOUS
  Filled 2015-11-08: qty 2

## 2015-11-08 MED ORDER — ACETAMINOPHEN 325 MG PO TABS
650.0000 mg | ORAL_TABLET | Freq: Once | ORAL | Status: AC
  Administered 2015-11-08: 650 mg via ORAL
  Filled 2015-11-08: qty 2

## 2015-11-08 MED ORDER — SODIUM CHLORIDE 0.9 % IV BOLUS (SEPSIS)
2000.0000 mL | Freq: Once | INTRAVENOUS | Status: AC
  Administered 2015-11-08: 2000 mL via INTRAVENOUS

## 2015-11-08 MED ORDER — DIPHENHYDRAMINE HCL 50 MG/ML IJ SOLN
12.5000 mg | Freq: Once | INTRAMUSCULAR | Status: AC
  Administered 2015-11-08: 12.5 mg via INTRAVENOUS
  Filled 2015-11-08: qty 1

## 2015-11-08 MED ORDER — HYDROMORPHONE HCL 2 MG/ML IJ SOLN
2.0000 mg | Freq: Once | INTRAMUSCULAR | Status: AC
Start: 2015-11-08 — End: 2015-11-08
  Administered 2015-11-08: 2 mg via INTRAVENOUS
  Filled 2015-11-08: qty 1

## 2015-11-08 MED ORDER — SODIUM CHLORIDE 0.9 % IV BOLUS (SEPSIS)
500.0000 mL | Freq: Once | INTRAVENOUS | Status: AC
  Administered 2015-11-08: 500 mL via INTRAVENOUS

## 2015-11-08 MED ORDER — HYDROMORPHONE HCL 2 MG/ML IJ SOLN
2.0000 mg | Freq: Once | INTRAMUSCULAR | Status: AC
  Administered 2015-11-08: 2 mg via INTRAVENOUS
  Filled 2015-11-08: qty 1

## 2015-11-08 MED ORDER — DIPHENHYDRAMINE-ZINC ACETATE 2-0.1 % EX CREA
TOPICAL_CREAM | Freq: Three times a day (TID) | CUTANEOUS | Status: DC | PRN
  Administered 2015-11-08: via TOPICAL
  Filled 2015-11-08: qty 28

## 2015-11-08 MED ORDER — CEFTRIAXONE SODIUM 1 G IJ SOLR
1.0000 g | Freq: Once | INTRAMUSCULAR | Status: AC
  Administered 2015-11-08: 1 g via INTRAVENOUS
  Filled 2015-11-08: qty 10

## 2015-11-08 NOTE — ED Notes (Addendum)
Pt complains of sickle cell pain crisis since this morning. Pt had recent pregnancy 2 weeks ago. Pt states she has had vaginal bleeding since 6/17. Pt also complains of vaginal cramping.

## 2015-11-08 NOTE — ED Notes (Signed)
MD at bedside. 

## 2015-11-08 NOTE — ED Notes (Signed)
Pt ambulated to restroom with assistance.  

## 2015-11-08 NOTE — ED Provider Notes (Signed)
CSN: [id number]     Arrival date & time 11/08/15  1758 History   First MD Initiated Contact with Patient 11/08/15 1802     Chief Complaint  Patient presents with  . Sickle Cell Pain Crisis  . Vaginal Bleeding     (Consider location/radiation/quality/duration/timing/severity/associated sxs/prior Treatment) HPI Comments: 25 year old female with history of sickle cell disease, 2 weeks postpartum from a spontaneous vaginal delivery presents for pain. The patient states that she woke up feeling okay this morning and went and got breakfast for her and her sister and then she will return home she felt so awful she could not eat her breakfast. She reports feeling well yesterday. She reports that her pain started in her legs that she now has generalized pain throughout her body. She denies nausea or vomiting. No diarrhea or constipation. She does have pain in her chest as well as in the rest of her body. The pain in her chest is bilateral on the sides. She said she has had some mild shortness of breath. Denies cough. She denies abdominal pain. She does state that she's continued to have some vaginal bleeding from her vagina ever since her delivery. She denies passing large clots or large amounts of blood. She denies any known fever at home.   Past Medical History  Diagnosis Date  . Miscarriage 03/22/2011    Pt reports 2 miscarriages.  . Depression 01/06/2011  . GERD (gastroesophageal reflux disease) 02/17/2011  . Trichotillomania     h/o  . Blood transfusion     "lots"  . Sickle cell anemia with crisis ([place])   . Exertional dyspnea     "sometimes"  . Migraines 11/08/11    "@ least twice/month"  . Chronic back pain     "very severe; have knot in my back; from tight muscle; take RX and exercise for it"  . Mood swings ([place]) 11/08/11    "I go back and forth; real bad"  . Sickle cell anemia ([name])   . Blood transfusion without reported diagnosis   . Genital HSV    Past Surgical History  Procedure  Laterality Date  . Cholecystectomy  05/2010  . Dilation and curettage of uterus  02/20/11    S/P miscarriage   Family History  Problem Relation Age of Onset  . Diabetes Maternal Grandmother   . Diabetes Paternal Grandmother   . Hypertension Paternal Grandmother   . Diabetes Maternal Grandfather   . Sickle cell trait Mother   . Sickle cell trait Father    Social History  Substance Use Topics  . Smoking status: Former Smoker -- 0.25 packs/day for 1 years    Types: Cigarettes    Quit date: 03/25/2013  . Smokeless tobacco: Never Used  . Alcohol Use: No     Comment: pt states she quit marijuan in May 2013. Rare ETOH, + cigarettes.  She is enrolled in school   OB History    Gravida Para Term Preterm AB TAB SAB Ectopic Multiple Living   3 2 2  1  1   0 2      Obstetric Comments   Miscarried in October 2012 at about 7 weeks     Review of Systems  Constitutional: Positive for fatigue. Negative for fever and chills.  HENT: Negative for congestion, postnasal drip, rhinorrhea and sinus pressure.   Eyes: Negative for visual disturbance.  Respiratory: Positive for shortness of breath. Negative for cough, chest tightness and wheezing.   Cardiovascular: Positive for chest pain. Negative  for palpitations and leg swelling.  Gastrointestinal: Negative for nausea, vomiting, abdominal pain and diarrhea.  Genitourinary: Negative for dysuria, urgency and hematuria.  Musculoskeletal: Positive for myalgias. Negative for back pain.  Skin: Negative for rash.  Neurological: Negative for dizziness, weakness, light-headedness and headaches.  Hematological: Does not bruise/bleed easily.      Allergies  Carrot; Carrot oil; and Latex  Home Medications   Prior to Admission medications   Medication Sig Start Date End Date Taking? Authorizing Provider  calcium carbonate (TUMS - DOSED IN MG ELEMENTAL CALCIUM) 500 MG chewable tablet Chew 2 tablets by mouth 2 (two) times daily as needed for indigestion  or heartburn.   Yes Historical Provider, MD  folic acid (FOLVITE) 1 MG tablet Take 1 mg by mouth daily.   Yes Historical Provider, MD  ibuprofen (ADVIL,MOTRIN) 600 MG tablet Take 1 tablet (600 mg total) by mouth every 6 (six) hours. 11/02/15  Yes Caren Macadam, MD  methadone (DOLOPHINE) 5 MG tablet Take 1 tablet (5 mg total) by mouth daily. 10/09/15  Yes Olugbemiga Essie Christine, MD  oxyCODONE (ROXICODONE) 15 MG immediate release tablet Take 1 tablet (15 mg total) by mouth 2 (two) times daily. 11/02/15  Yes Caren Macadam, MD  Prenatal Vit-Fe Fumarate-FA (PRENATAL MULTIVITAMIN) TABS tablet Take 1 tablet by mouth daily at 12 noon.   Yes Historical Provider, MD   BP 120/80 mmHg  Pulse 110  Temp(Src) 101.4 F (38.6 C)  Resp 38  Ht 5\' 1"  (1.549 m)  Wt 165 lb (74.844 kg)  BMI 31.19 kg/m2  SpO2 92%  LMP 10/31/2015 Physical Exam  Constitutional: She is oriented to person, place, and time. She appears well-developed and well-nourished. No distress.  HENT:  Head: Normocephalic and atraumatic.  Right Ear: External ear normal.  Left Ear: External ear normal.  Nose: Nose normal.  Mouth/Throat: Oropharynx is clear and moist. No oropharyngeal exudate.  Eyes: EOM are normal. Pupils are equal, round, and reactive to light.  Neck: Normal range of motion. Neck supple.  Cardiovascular: Regular rhythm, normal heart sounds and intact distal pulses.  Tachycardia present.   No murmur heard. Pulmonary/Chest: Effort normal. No respiratory distress. She has no wheezes. She has no rales.  Abdominal: Soft. She exhibits no distension. There is no tenderness.  Genitourinary: There is bleeding (very small amount of minimal bleeding, no significant blood in the vaginal vault, no large clots) in the vagina. No signs of injury around the vagina. No vaginal discharge found.  Musculoskeletal: Normal range of motion. She exhibits no edema or tenderness.  Neurological: She is alert and oriented to person, place,  and time.  Skin: Skin is warm and dry. No rash noted. She is not diaphoretic.  Vitals reviewed.   ED Course  Procedures (including critical care time) Labs Review Labs Reviewed  COMPREHENSIVE METABOLIC PANEL - Abnormal; Notable for the following:    Total Bilirubin 2.7 (*)    All other components within normal limits  CBC WITH DIFFERENTIAL/PLATELET - Abnormal; Notable for the following:    WBC 28.7 (*)    RBC 2.86 (*)    Hemoglobin 8.6 (*)    HCT 24.9 (*)    RDW 17.9 (*)    Platelets 633 (*)    Neutro Abs 25.6 (*)    Monocytes Absolute 1.7 (*)    All other components within normal limits  URINALYSIS, ROUTINE W REFLEX MICROSCOPIC (NOT AT [facility]) - Abnormal; Notable for the following:    Hgb urine dipstick MODERATE (*)  Leukocytes, UA TRACE (*)    All other components within normal limits  URINE MICROSCOPIC-ADD ON - Abnormal; Notable for the following:    Squamous Epithelial / LPF 0-5 (*)    All other components within normal limits  RETICULOCYTES - Abnormal; Notable for the following:    Retic Ct Pct 20.4 (*)    RBC. 2.82 (*)    Retic Count, Manual 575.3 (*)    All other components within normal limits  CULTURE, BLOOD (ROUTINE X 2)  CULTURE, BLOOD (ROUTINE X 2)  URINE CULTURE  LACTIC ACID, PLASMA  LACTIC ACID, PLASMA  I-STAT TROPOININ, ED    Imaging Review Dg Chest 2 View  11/08/2015  CLINICAL DATA:  25 year old female with sickle cell crisis presenting with chest pain and shortness of breath EXAM: CHEST  2 VIEW COMPARISON:  None FINDINGS: Two views of the chest do not demonstrate a focal consolidation. There is no pleural effusion or pneumothorax. Top-normal cardiac silhouette. 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 There is central depression of the vertebral body endplates with H shaped vertebra compatible with chronic changes of sickle cell. No acute fracture. IMPRESSION: No acute cardiopulmonary process.  Electronically Signed   By: Anner Crete M.D.   On: 11/08/2015 19:48   I have personally reviewed and evaluated these images and lab results as part of my medical decision-making.   EKG Interpretation None      MDM  Patient seen and evaluated in stable condition. Patient febrile on arrival. Pelvic examination with what appears to be normal postpartum vaginal bleeding. Abdomen and uterus were nontender. Patient was given a dose of Rocephin secondary to fever. She was also given 2 half liters of IV fluids. Patient was given multiple doses of IV pain medication with some improvement in symptoms. Initially patient was refusing to stay in the hospital because she has a 14-week-old at home. Eventually she did agree to stay in the hospital. Blood cultures and urine culture were sent previously. Laboratory studies were consistent with sickle cell crisis. Chest x-ray without findings of acute chest. Patient was never hypoxic. Discussed case with Dr. Tamala Julian from [facility] who agreed with admission but did recommend that we obtain a CTA. I agreed with this plan and CTA was ordered. Patient is to be admitted to the stepdown unit under his care pending CTA results. Final diagnoses:  Fever, unspecified fever cause  Sickle cell crisis ([place])    1. Fever 2. Sickle cell crisis    Harvel Quale, MD 11/09/15 [phone]

## 2015-11-08 NOTE — ED Notes (Signed)
Pt returned from X Ray.

## 2015-11-08 NOTE — H&P (Addendum)
History and Physical    [name] [id number] DOB: 1990-05-18 DOA: 11/08/2015  Referring MD/NP/PA: Dr. Alfonse Spruce PCP: Angelica Chessman, MD  Patient coming from: Home   Chief Complaint: Bleeding, cramping, and feels drained  HPI: [name] is a 25 y.o. female with medical history significant of sickle cell anemia requiring blood transfusions in the past, miscarriages, and approximately 2 weeks postpartum from spontaneous vaginal delivery; who presents with series of complaints including bleeding, chest pain, central body aching, cramping, feeling drained that started today when she woke up. Yesterday patient noted feeling in her normal state of health except for the complaints of vaginal bleeding. This morning however she woke up this morning and felt as though her head, shoulders, back, legs and groin area were in severe constant achy pain. She tried taking her regular home medications and methadone, ibuprofen, use heating pad, and took a nap without relief of symptoms. Patient states that she's been waking up with cold sweats middle of the night and thought it was related to her pain medication. Patient reports utilizing tampons anywhere from 3-6 per day she's been bleeding off and on since she delivered her baby. She expresses that she was told not to use tampons after delivery, but she has used them anyway. Associated symptoms included some complaints of centralized chest pain with generalized shortness of breath with activity, but patient denies any significant cough, hemoptysis, lower leg swelling, or weight gain.   ED Course: Upon admission into the emergency department patient is evaluated and found to be febrile to 101.88F, respirations up to 30, heart rates up to 120, and blood pressure as well as 97/54. Labwork revealed WBC 28.7( increased from 22.4), hemoglobin 8.6, platelets 633, and total bilirubin 2.7. Urinalysis appeared clear any signs of infection. Evaluated in the ED  found to have minimal vaginal bleeding. CT angiogram was ordered to evaluate for possible clots given recent vaginal delivery.  Review of Systems: As per HPI otherwise 10 point review of systems negative.   Past Medical History  Diagnosis Date  . Miscarriage 03/22/2011    Pt reports 2 miscarriages.  . Depression 01/06/2011  . GERD (gastroesophageal reflux disease) 02/17/2011  . Trichotillomania     h/o  . Blood transfusion     "lots"  . Sickle cell anemia with crisis ([place])   . Exertional dyspnea     "sometimes"  . Migraines 11/08/11    "@ least twice/month"  . Chronic back pain     "very severe; have knot in my back; from tight muscle; take RX and exercise for it"  . Mood swings ([place]) 11/08/11    "I go back and forth; real bad"  . Sickle cell anemia ([name])   . Blood transfusion without reported diagnosis   . Genital HSV     Past Surgical History  Procedure Laterality Date  . Cholecystectomy  05/2010  . Dilation and curettage of uterus  02/20/11    S/P miscarriage     reports that she quit smoking about 2 years ago. Her smoking use included Cigarettes. She has a .25 pack-year smoking history. She has never used smokeless tobacco. She reports that she uses illicit drugs (Other-see comments). She reports that she does not drink alcohol.  Allergies  Allergen Reactions  . Carrot [Daucus Carota] Hives, Swelling and Rash    Dietary:  Please do not send any form of carrot to patient  . Carrot Oil Hives and Swelling  . Latex Rash    *  powdered Latex*     Family History  Problem Relation Age of Onset  . Diabetes Maternal Grandmother   . Diabetes Paternal Grandmother   . Hypertension Paternal Grandmother   . Diabetes Maternal Grandfather   . Sickle cell trait Mother   . Sickle cell trait Father     Prior to Admission medications   Medication Sig Start Date End Date Taking? Authorizing Provider  calcium carbonate (TUMS - DOSED IN MG ELEMENTAL CALCIUM) 500 MG chewable tablet Chew  2 tablets by mouth 2 (two) times daily as needed for indigestion or heartburn.   Yes Historical Provider, MD  folic acid (FOLVITE) 1 MG tablet Take 1 mg by mouth daily.   Yes Historical Provider, MD  ibuprofen (ADVIL,MOTRIN) 600 MG tablet Take 1 tablet (600 mg total) by mouth every 6 (six) hours. 11/02/15  Yes Caren Macadam, MD  methadone (DOLOPHINE) 5 MG tablet Take 1 tablet (5 mg total) by mouth daily. 10/09/15  Yes Olugbemiga Essie Christine, MD  oxyCODONE (ROXICODONE) 15 MG immediate release tablet Take 1 tablet (15 mg total) by mouth 2 (two) times daily. 11/02/15  Yes Caren Macadam, MD  Prenatal Vit-Fe Fumarate-FA (PRENATAL MULTIVITAMIN) TABS tablet Take 1 tablet by mouth daily at 12 noon.   Yes Historical Provider, MD    Physical Exam: Filed Vitals:   11/08/15 2130 11/08/15 2204 11/08/15 2230 11/08/15 2245  BP: 103/55 122/84 103/68   Pulse:  119  102  Temp:      Resp: 19 25  18   Height:      Weight:      SpO2:  98%  97%      Constitutional: NAD, calm, comfortable Filed Vitals:   11/08/15 2130 11/08/15 2204 11/08/15 2230 11/08/15 2245  BP: 103/55 122/84 103/68   Pulse:  119  102  Temp:      Resp: 19 25  18   Height:      Weight:      SpO2:  98%  97%   Eyes: PERRL, lids and conjunctivae normal ENMT: Mucous membranes are moist. Posterior pharynx clear of any exudate or lesions.Normal dentition.  Neck: normal, supple, no masses, no thyromegaly Respiratory: clear to auscultation bilaterally, no wheezing, no crackles. Normal respiratory effort. No accessory muscle use.  Cardiovascular: Regular rate and rhythm, no murmurs / rubs / gallops. No extremity edema. 2+ pedal pulses. No carotid bruits.  Abdomen: no tenderness, no masses palpated. No hepatosplenomegaly. Bowel sounds positive.  Musculoskeletal: no clubbing / cyanosis. No joint deformity upper and lower extremities. Good ROM, no contractures. Normal muscle tone.  Skin: no rashes, lesions, ulcers. No  induration Neurologic: CN 2-12 grossly intact. Sensation intact, DTR normal. Strength 5/5 in all 4.  Psychiatric: Normal judgment and insight. Alert and oriented x 3. Normal mood.     Labs on Admission: I have personally reviewed following labs and imaging studies  CBC:  Recent Labs Lab 11/08/15 1845  WBC 28.7*  NEUTROABS 25.6*  HGB 8.6*  HCT 24.9*  MCV 87.1  PLT 99991111*   Basic Metabolic Panel:  Recent Labs Lab 11/08/15 1845  NA 135  K 3.9  CL 107  CO2 22  GLUCOSE 88  BUN 9  CREATININE 0.64  CALCIUM 9.0   GFR: Estimated Creatinine Clearance: 101.2 mL/min (by C-G formula based on Cr of 0.64). Liver Function Tests:  Recent Labs Lab 11/08/15 1845  AST 23  ALT 21  ALKPHOS 89  BILITOT 2.7*  PROT 7.5  ALBUMIN 3.7  No results for input(s): LIPASE, AMYLASE in the last 168 hours. No results for input(s): AMMONIA in the last 168 hours. Coagulation Profile: No results for input(s): INR, PROTIME in the last 168 hours. Cardiac Enzymes: No results for input(s): CKTOTAL, CKMB, CKMBINDEX, TROPONINI in the last 168 hours. BNP (last 3 results) No results for input(s): PROBNP in the last 8760 hours. HbA1C: No results for input(s): HGBA1C in the last 72 hours. CBG: No results for input(s): GLUCAP in the last 168 hours. Lipid Profile: No results for input(s): CHOL, HDL, LDLCALC, TRIG, CHOLHDL, LDLDIRECT in the last 72 hours. Thyroid Function Tests: No results for input(s): TSH, T4TOTAL, FREET4, T3FREE, THYROIDAB in the last 72 hours. Anemia Panel:  Recent Labs  11/08/15 2142  RETICCTPCT 20.4*   Urine analysis:    Component Value Date/Time   COLORURINE YELLOW 11/08/2015 2046   COLORURINE Yellow 05/06/2012 1546   APPEARANCEUR CLEAR 11/08/2015 2046   APPEARANCEUR Clear 05/06/2012 1546   LABSPEC 1.015 11/08/2015 2046   LABSPEC 1.011 05/06/2012 1546   PHURINE 6.5 11/08/2015 2046   PHURINE 5.0 05/06/2012 1546   GLUCOSEU NEGATIVE 11/08/2015 2046   GLUCOSEU  Negative 05/06/2012 1546   HGBUR MODERATE* 11/08/2015 2046   HGBUR 3+ 05/06/2012 1546   HGBUR negative 01/08/2009 [place] 11/08/2015 2046   BILIRUBINUR Negative 05/06/2012 [place] 11/08/2015 2046   KETONESUR Negative 05/06/2012 [place] 11/08/2015 2046   PROTEINUR Negative 05/06/2012 1546   UROBILINOGEN 2.0* 10/26/2015 0817   NITRITE NEGATIVE 11/08/2015 2046   NITRITE Negative 05/06/2012 1546   LEUKOCYTESUR TRACE* 11/08/2015 2046   LEUKOCYTESUR Negative 05/06/2012 1546   Sepsis Labs: No results found for this or any previous visit (from the past 240 hour(s)).   Radiological Exams on Admission: Dg Chest 2 View  11/08/2015  CLINICAL DATA:  25 year old female with sickle cell crisis presenting with chest pain and shortness of breath EXAM: CHEST  2 VIEW COMPARISON:  None FINDINGS: Two views of the chest do not demonstrate a focal consolidation. There is no pleural effusion or pneumothorax. Top-normal cardiac silhouette. 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 There is central depression of the vertebral body endplates with H shaped vertebra compatible with chronic changes of sickle cell. No acute fracture. IMPRESSION: No acute cardiopulmonary process. Electronically Signed   By: Anner Crete M.D.   On: 11/08/2015 19:48    EKG: Independently reviewed. Sinus tachycardia. Assessment/Plan Sepsis: Acute. Patient presents with with WBC of 28.7, fever up to 101.20F, heart rates up to 120, respirations up to mid to upper 30s, and blood pressure as low as 95/53 on admission. Given recent bleeding postpartum and low-grade fever question possibility of underlying infection. Patient was initially started on Rocephin in the ED. Vancomycin added after history of patient using tampons following vaginal delivery. - Admit to stepdown  - Follow-up blood cultures - Check lactic acid level -  Empiric antibiotics of Rocephin and vancomycin if patient clinically declines may need to add on coverage for Salmonella - Checking CT angiogram of the chest to rule out possibility of acute clot given recent being postpartum - Tylenol when necessary fever - Check UDS given previous polysubstance abuse history  Sickle cell anemia with possible pain crisis:  Acute. Patient with centralized body ache complaints. Not treated with normal home medications of methadone and ibuprofen. - Sickle cell pain crisis orders are initiated - Partial Dilaudid PCA pump -  Continue  folic acid   Vaginal bleeding: Suspected to be normal after vaginal delivery evaluated in ED.  - Continue to monitor  Postpartum spontaneous vaginal delivery:  2 weeks ago  Thrombocytosis: Suspected be reactive. - Continue to monitor   Hyperbilirubinemia: Acute on chronic. Total bilirubin elevated at 2.7. - Continue to monitor    DVT prophylaxis: Lovenox Code Status: Full  Family Communication: no family present at bedside Disposition Plan: Undetermined  Consults called: none Admission status: Observation stepdown  Norval Morton MD [facility] Pager [phone]  If 7PM-7AM, please contact night-coverage [email or web address] Password [email or web address]  11/08/2015, 11:44 PM

## 2015-11-09 ENCOUNTER — Emergency Department (HOSPITAL_COMMUNITY): Payer: Medicare Other

## 2015-11-09 ENCOUNTER — Telehealth: Payer: Self-pay | Admitting: Internal Medicine

## 2015-11-09 ENCOUNTER — Observation Stay (HOSPITAL_COMMUNITY): Payer: Medicare Other

## 2015-11-09 DIAGNOSIS — N939 Abnormal uterine and vaginal bleeding, unspecified: Secondary | ICD-10-CM | POA: Diagnosis present

## 2015-11-09 DIAGNOSIS — A419 Sepsis, unspecified organism: Secondary | ICD-10-CM | POA: Diagnosis not present

## 2015-11-09 DIAGNOSIS — D75839 Thrombocytosis, unspecified: Secondary | ICD-10-CM | POA: Diagnosis present

## 2015-11-09 DIAGNOSIS — R509 Fever, unspecified: Secondary | ICD-10-CM | POA: Diagnosis not present

## 2015-11-09 DIAGNOSIS — O85 Puerperal sepsis: Secondary | ICD-10-CM | POA: Diagnosis present

## 2015-11-09 DIAGNOSIS — R17 Unspecified jaundice: Secondary | ICD-10-CM | POA: Diagnosis present

## 2015-11-09 DIAGNOSIS — D5700 Hb-SS disease with crisis, unspecified: Secondary | ICD-10-CM

## 2015-11-09 DIAGNOSIS — D473 Essential (hemorrhagic) thrombocythemia: Secondary | ICD-10-CM | POA: Diagnosis not present

## 2015-11-09 DIAGNOSIS — F53 Puerperal psychosis: Secondary | ICD-10-CM | POA: Diagnosis present

## 2015-11-09 DIAGNOSIS — D7589 Other specified diseases of blood and blood-forming organs: Secondary | ICD-10-CM | POA: Diagnosis present

## 2015-11-09 DIAGNOSIS — R079 Chest pain, unspecified: Secondary | ICD-10-CM | POA: Diagnosis not present

## 2015-11-09 DIAGNOSIS — Z87891 Personal history of nicotine dependence: Secondary | ICD-10-CM | POA: Diagnosis not present

## 2015-11-09 HISTORY — DX: Thrombocytosis, unspecified: D75.839

## 2015-11-09 LAB — RAPID URINE DRUG SCREEN, HOSP PERFORMED
Amphetamines: NOT DETECTED
BARBITURATES: NOT DETECTED
BENZODIAZEPINES: NOT DETECTED
COCAINE: NOT DETECTED
Opiates: POSITIVE — AB
TETRAHYDROCANNABINOL: NOT DETECTED

## 2015-11-09 LAB — LACTIC ACID, PLASMA
LACTIC ACID, VENOUS: 0.7 mmol/L (ref 0.5–2.0)
LACTIC ACID, VENOUS: 1.4 mmol/L (ref 0.5–2.0)

## 2015-11-09 LAB — I-STAT TROPONIN, ED: Troponin i, poc: 0.02 ng/mL (ref 0.00–0.08)

## 2015-11-09 LAB — MRSA PCR SCREENING: MRSA by PCR: NEGATIVE

## 2015-11-09 MED ORDER — SENNOSIDES-DOCUSATE SODIUM 8.6-50 MG PO TABS
1.0000 | ORAL_TABLET | Freq: Two times a day (BID) | ORAL | Status: DC
  Administered 2015-11-09 – 2015-11-12 (×7): 1 via ORAL
  Filled 2015-11-09 (×7): qty 1

## 2015-11-09 MED ORDER — DIPHENHYDRAMINE HCL 25 MG PO CAPS
25.0000 mg | ORAL_CAPSULE | ORAL | Status: DC | PRN
  Filled 2015-11-09: qty 2

## 2015-11-09 MED ORDER — ACETAMINOPHEN 325 MG PO TABS: 650.0000 mg | ORAL_TABLET | Freq: Four times a day (QID) | ORAL | Status: DC | PRN

## 2015-11-09 MED ORDER — KETOROLAC TROMETHAMINE 15 MG/ML IJ SOLN
30.0000 mg | Freq: Four times a day (QID) | INTRAMUSCULAR | Status: DC
  Administered 2015-11-09 – 2015-11-12 (×12): 30 mg via INTRAVENOUS
  Filled 2015-11-09 (×12): qty 2

## 2015-11-09 MED ORDER — ENOXAPARIN SODIUM 40 MG/0.4ML ~~LOC~~ SOLN
40.0000 mg | SUBCUTANEOUS | Status: DC
  Administered 2015-11-10 – 2015-11-11 (×2): 40 mg via SUBCUTANEOUS
  Filled 2015-11-09 (×3): qty 0.4

## 2015-11-09 MED ORDER — DEXTROSE-NACL 5-0.45 % IV SOLN
INTRAVENOUS | Status: DC
  Administered 2015-11-09 – 2015-11-11 (×3): via INTRAVENOUS

## 2015-11-09 MED ORDER — VANCOMYCIN HCL IN DEXTROSE 1-5 GM/200ML-% IV SOLN
1000.0000 mg | INTRAVENOUS | Status: DC
  Administered 2015-11-09: 1000 mg via INTRAVENOUS
  Filled 2015-11-09: qty 200

## 2015-11-09 MED ORDER — ENOXAPARIN SODIUM 80 MG/0.8ML ~~LOC~~ SOLN
75.0000 mg | Freq: Two times a day (BID) | SUBCUTANEOUS | Status: DC
  Administered 2015-11-09: 75 mg via SUBCUTANEOUS
  Filled 2015-11-09 (×3): qty 0.8

## 2015-11-09 MED ORDER — SODIUM CHLORIDE 0.9% FLUSH
9.0000 mL | INTRAVENOUS | Status: DC | PRN
Start: 2015-11-09 — End: 2015-11-12

## 2015-11-09 MED ORDER — NALOXONE HCL 0.4 MG/ML IJ SOLN: 0.4000 mg | INTRAMUSCULAR | Status: DC | PRN

## 2015-11-09 MED ORDER — FOLIC ACID 1 MG PO TABS
1.0000 mg | ORAL_TABLET | Freq: Every day | ORAL | Status: DC
  Administered 2015-11-09 – 2015-11-12 (×4): 1 mg via ORAL
  Filled 2015-11-09 (×4): qty 1

## 2015-11-09 MED ORDER — METHADONE HCL 5 MG PO TABS
5.0000 mg | ORAL_TABLET | Freq: Every day | ORAL | Status: DC
  Administered 2015-11-09 – 2015-11-12 (×4): 5 mg via ORAL
  Filled 2015-11-09 (×4): qty 1

## 2015-11-09 MED ORDER — DIPHENHYDRAMINE HCL 50 MG/ML IJ SOLN
12.5000 mg | Freq: Four times a day (QID) | INTRAMUSCULAR | Status: DC | PRN
  Administered 2015-11-09: 12.5 mg via INTRAVENOUS
  Filled 2015-11-09: qty 1

## 2015-11-09 MED ORDER — POLYETHYLENE GLYCOL 3350 17 G PO PACK: 17.0000 g | PACK | Freq: Every day | ORAL | Status: DC | PRN

## 2015-11-09 MED ORDER — SODIUM CHLORIDE 0.9% FLUSH: 9.0000 mL | INTRAVENOUS | Status: DC | PRN

## 2015-11-09 MED ORDER — HYDROMORPHONE 1 MG/ML IV SOLN
INTRAVENOUS | Status: DC
  Administered 2015-11-09: 0.7 mg via INTRAVENOUS
  Administered 2015-11-09: 1.2 mg via INTRAVENOUS
  Administered 2015-11-09: 1 mg via INTRAVENOUS
  Filled 2015-11-09: qty 25

## 2015-11-09 MED ORDER — HYDROMORPHONE 1 MG/ML IV SOLN
INTRAVENOUS | Status: DC
  Administered 2015-11-09: 7.5 mg via INTRAVENOUS
  Administered 2015-11-09: 7.2 mg via INTRAVENOUS
  Administered 2015-11-09: 1.8 mg via INTRAVENOUS
  Administered 2015-11-10: 8.16 mg via INTRAVENOUS
  Administered 2015-11-10: 6.4 mg via INTRAVENOUS
  Administered 2015-11-10 (×2): 5.6 mg via INTRAVENOUS
  Administered 2015-11-11: 2.4 mg via INTRAVENOUS
  Administered 2015-11-11 (×2): 8 mg via INTRAVENOUS
  Administered 2015-11-11: 5.6 mg via INTRAVENOUS
  Administered 2015-11-11: 11:00:00 via INTRAVENOUS
  Administered 2015-11-11: 7.2 mg via INTRAVENOUS
  Administered 2015-11-11: 4.8 mg via INTRAVENOUS
  Administered 2015-11-12: 1.6 mg via INTRAVENOUS
  Administered 2015-11-12: 01:00:00 via INTRAVENOUS
  Administered 2015-11-12: 7.4 mg via INTRAVENOUS
  Administered 2015-11-12: 8 mg via INTRAVENOUS
  Administered 2015-11-12: 4.8 mg via INTRAVENOUS
  Filled 2015-11-09 (×4): qty 25

## 2015-11-09 MED ORDER — VANCOMYCIN HCL IN DEXTROSE 1-5 GM/200ML-% IV SOLN
1000.0000 mg | Freq: Once | INTRAVENOUS | Status: AC
  Administered 2015-11-09: 1000 mg via INTRAVENOUS
  Filled 2015-11-09: qty 200

## 2015-11-09 MED ORDER — IOPAMIDOL (ISOVUE-370) INJECTION 76%
100.0000 mL | Freq: Once | INTRAVENOUS | Status: AC | PRN
Start: 2015-11-09 — End: 2015-11-09
  Administered 2015-11-09: 100 mL via INTRAVENOUS

## 2015-11-09 MED ORDER — KETOROLAC TROMETHAMINE 15 MG/ML IJ SOLN
15.0000 mg | Freq: Four times a day (QID) | INTRAMUSCULAR | Status: DC
  Administered 2015-11-09 (×2): 15 mg via INTRAVENOUS
  Filled 2015-11-09 (×2): qty 1

## 2015-11-09 MED ORDER — DIPHENHYDRAMINE HCL 12.5 MG/5ML PO ELIX: 12.5000 mg | ORAL_SOLUTION | Freq: Four times a day (QID) | ORAL | Status: DC | PRN

## 2015-11-09 MED ORDER — DEXTROSE 5 % IV SOLN
2.0000 g | INTRAVENOUS | Status: DC
  Administered 2015-11-09 – 2015-11-12 (×4): 2 g via INTRAVENOUS
  Filled 2015-11-09 (×4): qty 2

## 2015-11-09 MED ORDER — ONDANSETRON HCL 4 MG/2ML IJ SOLN
4.0000 mg | Freq: Four times a day (QID) | INTRAMUSCULAR | Status: DC | PRN
Start: 2015-11-09 — End: 2015-11-12

## 2015-11-09 MED ORDER — DIPHENHYDRAMINE HCL 50 MG/ML IJ SOLN
25.0000 mg | INTRAMUSCULAR | Status: DC | PRN
  Administered 2015-11-09 – 2015-11-11 (×9): 25 mg via INTRAVENOUS
  Filled 2015-11-09 (×16): qty 0.5

## 2015-11-09 MED ORDER — IBUPROFEN 200 MG PO TABS: 600.0000 mg | ORAL_TABLET | Freq: Four times a day (QID) | ORAL | Status: DC

## 2015-11-09 MED ORDER — HYDROMORPHONE HCL 2 MG/ML IJ SOLN
2.0000 mg | INTRAMUSCULAR | Status: DC | PRN
  Administered 2015-11-09 – 2015-11-11 (×14): 2 mg via INTRAVENOUS
  Filled 2015-11-09 (×13): qty 1

## 2015-11-09 NOTE — ED Notes (Signed)
MD made aware that CTA cannot be done with pt's current IV

## 2015-11-09 NOTE — Progress Notes (Signed)
Lovenox per Pharmacy for DVT Prophylaxis    Pharmacy has been consulted from dosing enoxaparin (lovenox) in this patient for DVT prophylaxis.  The pharmacist has reviewed pertinent labs (Hgb _8.6__; PLT_633__), patient weight (__78.3_kg) and renal function (CrCl_>90__mL/min) and decided that enoxaparin _40_mg SQ Q_24_Hrs is appropriate for this patient.  The pharmacy department will sign off at this time.  Please reconsult pharmacy if status changes or for further issues.  Thank you  Cyndia Diver PharmD, BCPS  11/09/2015, 6:06 AM

## 2015-11-09 NOTE — ED Notes (Signed)
Delay in transporting pt to ICU because pt needs to have CTA done

## 2015-11-09 NOTE — Progress Notes (Signed)
Patient ID: [name], female   DOB: 05-Jul-1990, 25 y.o.   MRN: [id number] Subjective: [name] is a 25 y.o. female with medical history significant of sickle cell anemia requiring blood transfusions in the past, miscarriages, and approximately 2 weeks postpartum from spontaneous vaginal delivery; who presented to the ED with series of complaints including vaginal bleeding, chest pain, central body aching, cramping, feeling drained that started today when she woke up. She was evaluated and found to have fever of > 101, tachycardia of up to 130, Tachypnea of up to 30 and leukocytosis of 28.7. She was admitted for Sepsis and sickle cell pain crisis.  This morning, she is complaining of significant pain, unimproved from time of admission, still feeling feverish, generally feels unwell. No cough, denies chest pain. No nausea or vomiting but has anorexia.   Objective:  Vital signs in last 24 hours:  Filed Vitals:   11/09/15 1000 11/09/15 1138 11/09/15 1200 11/09/15 1400  BP:    134/58  Pulse: 86   96  Temp:  98.2 F (36.8 C)    TempSrc:  Oral    Resp: 21  17 15   Height:      Weight:      SpO2: 98%  100% 100%    Intake/Output from previous day:   Intake/Output Summary (Last 24 hours) at 11/09/15 1501 Last data filed at 11/09/15 0500  Gross per 24 hour  Intake    175 ml  Output      0 ml  Net    175 ml    Physical Exam: General: Alert, awake, oriented x3, in no acute distress.  HEENT: [place]/AT PEERL, EOMI Neck: Trachea midline,  no masses, no thyromegal,y no JVD, no carotid bruit OROPHARYNX:  Moist, No exudate/ erythema/lesions.  Heart: Regular rate and rhythm, without murmurs, rubs, gallops, PMI non-displaced, no heaves or thrills on palpation.  Lungs: Clear to auscultation, no wheezing or rhonchi noted. No increased vocal fremitus resonant to percussion  Abdomen: Soft, nontender, nondistended, positive bowel sounds, no masses no hepatosplenomegaly noted..  Neuro: No  focal neurological deficits noted cranial nerves II through XII grossly intact. DTRs 2+ bilaterally upper and lower extremities. Strength 5 out of 5 in bilateral upper and lower extremities. Musculoskeletal: No warm swelling or erythema around joints, no spinal tenderness noted. Psychiatric: Patient alert and oriented x3, good insight and cognition, good recent to remote recall. Lymph node survey: No cervical axillary or inguinal lymphadenopathy noted.  Lab Results:  Basic Metabolic Panel:    Component Value Date/Time   NA 135 11/08/2015 1845   NA 139 05/06/2012 1645   K 3.9 11/08/2015 1845   K 3.9 05/06/2012 1645   CL 107 11/08/2015 1845   CL 110* 05/06/2012 1645   CO2 22 11/08/2015 1845   CO2 22 05/06/2012 1645   BUN 9 11/08/2015 1845   BUN 9 05/06/2012 1645   CREATININE 0.64 11/08/2015 1845   CREATININE 0.43* 07/06/2015 1356   CREATININE 0.57* 05/06/2012 1645   GLUCOSE 88 11/08/2015 1845   GLUCOSE 80 08/12/2015 0605   GLUCOSE 84 05/06/2012 1645   CALCIUM 9.0 11/08/2015 1845   CALCIUM 8.3* 05/06/2012 1645   CBC:    Component Value Date/Time   WBC 28.7* 11/08/2015 1845   WBC 14.2* 05/11/2012 0721   HGB 8.6* 11/08/2015 1845   HGB 8.7* 05/11/2012 0721   HCT 24.9* 11/08/2015 1845   HCT 26.9* 05/11/2012 0721   PLT 633* 11/08/2015 1845   PLT 497*  05/11/2012 0721   MCV 87.1 11/08/2015 1845   MCV 94 05/11/2012 0721   NEUTROABS 25.6* 11/08/2015 1845   NEUTROABS 10.9* 05/10/2012 0552   LYMPHSABS 1.4 11/08/2015 1845   LYMPHSABS 3.5 05/10/2012 0552   MONOABS 1.7* 11/08/2015 1845   MONOABS 1.7* 05/10/2012 0552   EOSABS 0.0 11/08/2015 1845   EOSABS 0.8* 05/10/2012 0552   BASOSABS 0.0 11/08/2015 1845   BASOSABS 0.1 05/10/2012 0552   BASOSABS 1 05/09/2012 0939    Recent Results (from the past 240 hour(s))  MRSA PCR Screening     Status: None   Collection Time: 11/09/15  4:07 AM  Result Value Ref Range Status   MRSA by PCR NEGATIVE NEGATIVE Final    Comment:        The  GeneXpert MRSA Assay (FDA approved for NASAL specimens only), is one component of a comprehensive MRSA colonization surveillance program. It is not intended to diagnose MRSA infection nor to guide or monitor treatment for MRSA infections.     Studies/Results: Dg Chest 2 View  11/08/2015  CLINICAL DATA:  25 year old female with sickle cell crisis presenting with chest pain and shortness of breath EXAM: CHEST  2 VIEW COMPARISON:  None FINDINGS: Two views of the chest do not demonstrate a focal consolidation. There is no pleural effusion or pneumothorax. Top-normal cardiac silhouette. 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 There is central depression of the vertebral body endplates with H shaped vertebra compatible with chronic changes of sickle cell. No acute fracture. IMPRESSION: No acute cardiopulmonary process. Electronically Signed   By: Anner Crete M.D.   On: 11/08/2015 19:48   Ct Angio Chest Pe W/cm &/or Wo Cm  11/09/2015  CLINICAL DATA:  Chest pain. Sickle cell disease. Two weeks postpartum. Leukocytosis. EXAM: CT ANGIOGRAPHY CHEST WITH CONTRAST TECHNIQUE: Multidetector CT imaging of the chest was performed using the standard protocol during bolus administration of intravenous contrast. Multiplanar CT image reconstructions and MIPs were obtained to evaluate the vascular anatomy. CONTRAST:  100 mL Isovue 370 intravenous COMPARISON:  Radiographs 11/08/2015 FINDINGS: Cardiovascular: Suboptimal opacification of the pulmonary vasculature. No large or central pulmonary emboli. No peripheral emboli are evident, but sensitivity for detection is reduced. Lungs: Clear except for minimal atelectatic appearing posterior lung base opacities. Mild linear scarring in the right middle lobe. Central airways: Patent and normal in caliber Effusions: None Lymphadenopathy: None Esophagus: Unremarkable Upper abdomen: No significant abnormality  Musculoskeletal: Sharply delimited central endplate depressions resulting in H-shaped vertebral bodies, a known association with sickle cell disease, due to microvascular endplate ischemia. Review of the MIP images confirms the above findings. IMPRESSION: Negative for acute pulmonary embolism although there is reduced sensitivity for detection of small peripheral emboli due to suboptimal pulmonary arterial opacification. No acute findings. Electronically Signed   By: Andreas Newport M.D.   On: 11/09/2015 02:41    Medications: Scheduled Meds: . cefTRIAXone (ROCEPHIN)  IV  2 g Intravenous Q24H  . [START ON 11/10/2015] enoxaparin (LOVENOX) injection  40 mg Subcutaneous Q24H  . folic acid  1 mg Oral Daily  . HYDROmorphone   Intravenous Q4H  . ketorolac  30 mg Intravenous Q6H  . methadone  5 mg Oral Daily  . senna-docusate  1 tablet Oral BID  . vancomycin  1,000 mg Intravenous Q24H   Continuous Infusions: . dextrose 5 % and 0.45% NaCl 125 mL/hr at 11/09/15 1228   PRN Meds:.acetaminophen, diphenhydrAMINE **OR** diphenhydrAMINE (BENADRYL) IVPB(SICKLE CELL ONLY), naloxone **AND**  sodium chloride flush, ondansetron (ZOFRAN) IV, polyethylene glycol  Consultants:  None  Procedures:  None  Antibiotics:  Day 2 on IV Vanco and Ceftriaxone  Assessment/Plan: Principal Problem:   Sepsis ([place]) Active Problems:   Sickle cell crisis ([name])   Thrombocytosis ([name])   Vaginal bleeding   Hyperbilirubinemia   Pyrexia  1. Sepsis: Patient was admitted with fever of up to 101.4*F, tachycardia of 120, tachypnea of up to 30 and hypotension. She is day 2 on IV antibiotics: Vancomycin and Rocephin. She has no new complaint, still feels feverish, pain uncontrolled due to inadequate pain meds dosage. Will reorder her PCA per SCD protocol, IVF and IV Toradol. Tylenol PRN for fever. Follow up blood and urine cultures. 2. Hb SS with crisis: Will adjust Dilaudid PCA and clinician assisted doses. Patient  encouraged to eat and drink well, ambulate as tolerated 3. Leukocytosis: Possibly from infection, patient using tampon post-partum, came in with fever and tachycardia. Continue antibiotics. 4. Anemia of chronic disease: The patient's Hb on admission was 8.6, which is at her baseline. Will monitor 5. Postpartum Depression: Psych consult will be considered if symptom persists 6. Chronic pain: Continue methadone 5 mg daily at bedtime.  Code Status: Full Code Family Communication: N/A Disposition Plan: Not yet ready for discharge  Stephanie Mcglone  If 7PM-7AM, please contact night-coverage.  11/09/2015, 3:01 PM  LOS: 0 days

## 2015-11-09 NOTE — Progress Notes (Signed)
Pharmacy Antibiotic Note  [name] is a 25 y.o. female admitted on 11/08/2015 with sepsis.  Pharmacy has been consulted for Vancomycin, ceftriaxone dosing.  Plan: Vancomycin 1gm IV every 8 hours.  Goal trough 15-20 mcg/mL.  Ceftriaxone 2gm iv q24hr    Height: 5\' 1"  (154.9 cm) Weight: 172 lb 9.9 oz (78.3 kg) IBW/kg (Calculated) : 47.8  Temp (24hrs), Avg:100 F (37.8 C), Min:98.6 F (37 C), Max:101.4 F (38.6 C)   Recent Labs Lab 11/08/15 1845 11/09/15 0201 11/09/15 0506  WBC 28.7*  --   --   CREATININE 0.64  --   --   LATICACIDVEN  --  0.7 1.4    Estimated Creatinine Clearance: 103.6 mL/min (by C-G formula based on Cr of 0.64).    Allergies  Allergen Reactions  . Carrot [Daucus Carota] Hives, Swelling and Rash    Dietary:  Please do not send any form of carrot to patient  . Carrot Oil Hives and Swelling  . Latex Rash    *powdered Latex*     Antimicrobials this admission: Vancomycin 6/26 >> Ceftriaxone 6/25 >>  Dose adjustments this admission: -  Microbiology results: Pending  Thank you for allowing pharmacy to be a part of this patient's care.  Nani Skillern Crowford 11/09/2015 6:05 AM

## 2015-11-10 DIAGNOSIS — N939 Abnormal uterine and vaginal bleeding, unspecified: Secondary | ICD-10-CM

## 2015-11-10 LAB — URINE CULTURE

## 2015-11-10 LAB — CBC WITH DIFFERENTIAL/PLATELET
Basophils Absolute: 0 10*3/uL (ref 0.0–0.1)
Basophils Relative: 0 %
EOS ABS: 0.5 10*3/uL (ref 0.0–0.7)
EOS PCT: 4 %
HCT: 20.6 % — ABNORMAL LOW (ref 36.0–46.0)
HEMOGLOBIN: 7.1 g/dL — AB (ref 12.0–15.0)
LYMPHS ABS: 1.9 10*3/uL (ref 0.7–4.0)
LYMPHS PCT: 15 %
MCH: 29.5 pg (ref 26.0–34.0)
MCHC: 34.5 g/dL (ref 30.0–36.0)
MCV: 85.5 fL (ref 78.0–100.0)
MONOS PCT: 10 %
Monocytes Absolute: 1.3 10*3/uL — ABNORMAL HIGH (ref 0.1–1.0)
Neutro Abs: 8.6 10*3/uL — ABNORMAL HIGH (ref 1.7–7.7)
Neutrophils Relative %: 71 %
PLATELETS: 522 10*3/uL — AB (ref 150–400)
RBC: 2.41 MIL/uL — AB (ref 3.87–5.11)
RDW: 17.5 % — ABNORMAL HIGH (ref 11.5–15.5)
WBC: 12.3 10*3/uL — AB (ref 4.0–10.5)

## 2015-11-10 LAB — COMPREHENSIVE METABOLIC PANEL
ALK PHOS: 76 U/L (ref 38–126)
ALT: 21 U/L (ref 14–54)
ANION GAP: 4 — AB (ref 5–15)
AST: 19 U/L (ref 15–41)
Albumin: 3.5 g/dL (ref 3.5–5.0)
BUN: 11 mg/dL (ref 6–20)
CALCIUM: 8.5 mg/dL — AB (ref 8.9–10.3)
CO2: 24 mmol/L (ref 22–32)
CREATININE: 0.52 mg/dL (ref 0.44–1.00)
Chloride: 112 mmol/L — ABNORMAL HIGH (ref 101–111)
Glucose, Bld: 92 mg/dL (ref 65–99)
Potassium: 4.5 mmol/L (ref 3.5–5.1)
SODIUM: 140 mmol/L (ref 135–145)
Total Bilirubin: 2.1 mg/dL — ABNORMAL HIGH (ref 0.3–1.2)
Total Protein: 6.9 g/dL (ref 6.5–8.1)

## 2015-11-10 MED ORDER — VANCOMYCIN HCL IN DEXTROSE 1-5 GM/200ML-% IV SOLN
1000.0000 mg | Freq: Three times a day (TID) | INTRAVENOUS | Status: DC
  Administered 2015-11-10 – 2015-11-11 (×4): 1000 mg via INTRAVENOUS
  Filled 2015-11-10 (×4): qty 200

## 2015-11-10 NOTE — Progress Notes (Signed)
[name] ID: [name], female   DOB: [birth year], 25 y.o.   MRN: [id number] Subjective:  [name] feels much better today, no more fever, no cough, no chest pain, no SOB. She ambulates in the room without difficulty. She rates her pain at 7 - 8/10.  Objective:  Vital signs in last 24 hours:  Filed Vitals:   11/10/15 0830 11/10/15 1100 11/10/15 1300 11/10/15 1335  BP:  128/74  131/107  Pulse:  99  96  Temp:  98.4 F (36.9 C)  98.7 F (37.1 C)  TempSrc:  Oral  Oral  Resp: 14 13 18 16   Height:      Weight:      SpO2: 99% 100% 100% 98%    Intake/Output from previous day:   Intake/Output Summary (Last 24 hours) at 11/10/15 1437 Last data filed at 11/10/15 1335  Gross per 24 hour  Intake    600 ml  Output   1300 ml  Net   -700 ml    Physical Exam: General: Alert, awake, oriented x3, in no acute distress.  HEENT: [place]/AT PEERL, EOMI Neck: Trachea midline,  no masses, no thyromegal,y no JVD, no carotid bruit OROPHARYNX:  Moist, No exudate/ erythema/lesions.  Heart: Regular rate and rhythm, without murmurs, rubs, gallops, PMI non-displaced, no heaves or thrills on palpation.  Lungs: Clear to auscultation, no wheezing or rhonchi noted. No increased vocal fremitus resonant to percussion  Abdomen: Soft, nontender, nondistended, positive bowel sounds, no masses no hepatosplenomegaly noted..  Neuro: No focal neurological deficits noted cranial nerves II through XII grossly intact. DTRs 2+ bilaterally upper and lower extremities. Strength 5 out of 5 in bilateral upper and lower extremities. Musculoskeletal: No warm swelling or erythema around joints, no spinal tenderness noted. Psychiatric: [name] alert and oriented x3, good insight and cognition, good recent to remote recall. Lymph node survey: No cervical axillary or inguinal lymphadenopathy noted.  Lab Results:  Basic Metabolic Panel:    Component Value Date/Time   NA 140 11/10/2015 0402   NA 139 05/06/2012 1645   K 4.5  11/10/2015 0402   K 3.9 05/06/2012 1645   CL 112* 11/10/2015 0402   CL 110* 05/06/2012 1645   CO2 24 11/10/2015 0402   CO2 22 05/06/2012 1645   BUN 11 11/10/2015 0402   BUN 9 05/06/2012 1645   CREATININE 0.52 11/10/2015 0402   CREATININE 0.43* 07/06/2015 1356   CREATININE 0.57* 05/06/2012 1645   GLUCOSE 92 11/10/2015 0402   GLUCOSE 80 08/12/2015 0605   GLUCOSE 84 05/06/2012 1645   CALCIUM 8.5* 11/10/2015 0402   CALCIUM 8.3* 05/06/2012 1645   CBC:    Component Value Date/Time   WBC 12.3* 11/10/2015 0402   WBC 14.2* 05/11/2012 0721   HGB 7.1* 11/10/2015 0402   HGB 8.7* 05/11/2012 0721   HCT 20.6* 11/10/2015 0402   HCT 26.9* 05/11/2012 0721   PLT 522* 11/10/2015 0402   PLT 497* 05/11/2012 0721   MCV 85.5 11/10/2015 0402   MCV 94 05/11/2012 0721   NEUTROABS 8.6* 11/10/2015 0402   NEUTROABS 10.9* 05/10/2012 0552   LYMPHSABS 1.9 11/10/2015 0402   LYMPHSABS 3.5 05/10/2012 0552   MONOABS 1.3* 11/10/2015 0402   MONOABS 1.7* 05/10/2012 0552   EOSABS 0.5 11/10/2015 0402   EOSABS 0.8* 05/10/2012 0552   BASOSABS 0.0 11/10/2015 0402   BASOSABS 0.1 05/10/2012 0552   BASOSABS 1 05/09/2012 0939    Recent Results (from the past 240 hour(s))  Urine culture  Status: Abnormal   Collection Time: 11/08/15  8:46 PM  Result Value Ref Range Status   Specimen Description URINE, CLEAN CATCH  Final   Special Requests NONE  Final   Culture MULTIPLE SPECIES PRESENT, SUGGEST RECOLLECTION (A)  Final   Report Status 11/10/2015 FINAL  Final  MRSA PCR Screening     Status: None   Collection Time: 11/09/15  4:07 AM  Result Value Ref Range Status   MRSA by PCR NEGATIVE NEGATIVE Final    Comment:        The GeneXpert MRSA Assay (FDA approved for NASAL specimens only), is one component of a comprehensive MRSA colonization surveillance program. It is not intended to diagnose MRSA infection nor to guide or monitor treatment for MRSA infections.     Studies/Results: Dg Chest 2  View  11/08/2015  CLINICAL DATA:  25 year old female with sickle cell crisis presenting with chest pain and shortness of breath EXAM: CHEST  2 VIEW COMPARISON:  None FINDINGS: Two views of the chest do not demonstrate a focal consolidation. There is no pleural effusion or pneumothorax. Top-normal cardiac silhouette. 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 There is central depression of the vertebral body endplates with H shaped vertebra compatible with chronic changes of sickle cell. No acute fracture. IMPRESSION: No acute cardiopulmonary process. Electronically Signed   By: Anner Crete M.D.   On: 11/08/2015 19:48   Ct Angio Chest Pe W/cm &/or Wo Cm  11/09/2015  CLINICAL DATA:  Chest pain. Sickle cell disease. Two weeks postpartum. Leukocytosis. EXAM: CT ANGIOGRAPHY CHEST WITH CONTRAST TECHNIQUE: Multidetector CT imaging of the chest was performed using the standard protocol during bolus administration of intravenous contrast. Multiplanar CT image reconstructions and MIPs were obtained to evaluate the vascular anatomy. CONTRAST:  100 mL Isovue 370 intravenous COMPARISON:  Radiographs 11/08/2015 FINDINGS: Cardiovascular: Suboptimal opacification of the pulmonary vasculature. No large or central pulmonary emboli. No peripheral emboli are evident, but sensitivity for detection is reduced. Lungs: Clear except for minimal atelectatic appearing posterior lung base opacities. Mild linear scarring in the right middle lobe. Central airways: Patent and normal in caliber Effusions: None Lymphadenopathy: None Esophagus: Unremarkable Upper abdomen: No significant abnormality Musculoskeletal: Sharply delimited central endplate depressions resulting in H-shaped vertebral bodies, a known association with sickle cell disease, due to microvascular endplate ischemia. Review of the MIP images confirms the above findings. IMPRESSION: Negative for acute pulmonary embolism  although there is reduced sensitivity for detection of small peripheral emboli due to suboptimal pulmonary arterial opacification. No acute findings. Electronically Signed   By: Andreas Newport M.D.   On: 11/09/2015 02:41    Medications: Scheduled Meds: . cefTRIAXone (ROCEPHIN)  IV  2 g Intravenous Q24H  . enoxaparin (LOVENOX) injection  40 mg Subcutaneous Q24H  . folic acid  1 mg Oral Daily  . HYDROmorphone   Intravenous Q4H  . ketorolac  30 mg Intravenous Q6H  . methadone  5 mg Oral Daily  . senna-docusate  1 tablet Oral BID  . vancomycin  1,000 mg Intravenous Q8H   Continuous Infusions: . dextrose 5 % and 0.45% NaCl 125 mL/hr at 11/09/15 1228   PRN Meds:.acetaminophen, diphenhydrAMINE **OR** diphenhydrAMINE (BENADRYL) IVPB(SICKLE CELL ONLY), HYDROmorphone (DILAUDID) injection, naloxone **AND** sodium chloride flush, ondansetron (ZOFRAN) IV, polyethylene glycol  Consultants:  None  Procedures:  None  Antibiotics:  Day 3 on IV Vancomycin and Ceftriaxone  Assessment/Plan: Principal Problem:   Sepsis ([place]) Active Problems:   Sickle  cell crisis ([place])   Thrombocytosis ([place])   Vaginal bleeding   Hyperbilirubinemia   Pyrexia  1. Sepsis: [name] was admitted with fever of up to 101.4*F, tachycardia of 120, tachypnea of up to 30 and hypotension. She is day 3 on IV antibiotics: Vancomycin and Rocephin. She has no new complaint. She feels much better today with the new PCA regimen. No more fever > 24 hours. Will continue her PCA IVF and IV Toradol at current doses. Continue clinician assisted doses prn. Blood is negative so far, urine sample contaminated. Labs in am 2. Hb SS with crisis: Will continue Dilaudid PCA and clinician assisted doses. [name] encouraged to eat and drink well, ambulate as tolerated 3. Leukocytosis: improved, now 12.3, down from 28.7. Continue antibiotics. 4. Anemia of chronic disease: Hb dropped to 7.1 from 8.6. Will continue to monitor 5. Postpartum  Depression: Psych consult will be considered if symptom persists, but so far on this admission, [name] is better, saying she felt ok yesterday after "pumping breast milk".  6. Chronic pain: Continue methadone 5 mg daily at bedtime.  Code Status: Full Code Family Communication: N/A Disposition Plan: For possible discharge in 24 - 48 hours  Yarden Hillis  If 7PM-7AM, please contact night-coverage.  11/10/2015, 2:37 PM  LOS: 1 day

## 2015-11-10 NOTE — Care Management Note (Signed)
Case Management Note  Patient Details  Name: [name] MRN: [id number] Date of Birth: [birth year]  Subjective/Objective:            25 yo admitted with Sepsis and SCC        Action/Plan: From home with family. 2 weeks postpartum. Chart reviewed and CM following for DC needs.  Expected Discharge Date:                  Expected Discharge Plan:  Home/Self Care  In-House Referral:     Discharge planning Services  CM Consult  Post Acute Care Choice:    Choice offered to:     DME Arranged:    DME Agency:     HH Arranged:    HH Agency:     Status of Service:  In process, will continue to follow  If discussed at Long Length of Stay Meetings, dates discussed:    Additional Comments:  Lynnell Catalan, RN 11/10/2015, 12:20 PM

## 2015-11-11 LAB — CBC WITH DIFFERENTIAL/PLATELET
Basophils Absolute: 0.1 10*3/uL (ref 0.0–0.1)
Basophils Relative: 0 %
EOS ABS: 0.8 10*3/uL — AB (ref 0.0–0.7)
EOS PCT: 5 %
HCT: 19.4 % — ABNORMAL LOW (ref 36.0–46.0)
Hemoglobin: 6.5 g/dL — CL (ref 12.0–15.0)
LYMPHS ABS: 2.6 10*3/uL (ref 0.7–4.0)
Lymphocytes Relative: 14 %
MCH: 28.6 pg (ref 26.0–34.0)
MCHC: 33.5 g/dL (ref 30.0–36.0)
MCV: 85.5 fL (ref 78.0–100.0)
MONO ABS: 1.5 10*3/uL — AB (ref 0.1–1.0)
Monocytes Relative: 8 %
Neutro Abs: 13.3 10*3/uL — ABNORMAL HIGH (ref 1.7–7.7)
Neutrophils Relative %: 73 %
PLATELETS: 520 10*3/uL — AB (ref 150–400)
RBC: 2.27 MIL/uL — AB (ref 3.87–5.11)
RDW: 18 % — AB (ref 11.5–15.5)
WBC: 18.3 10*3/uL — AB (ref 4.0–10.5)

## 2015-11-11 LAB — COMPREHENSIVE METABOLIC PANEL
ALBUMIN: 3.1 g/dL — AB (ref 3.5–5.0)
ALT: 18 U/L (ref 14–54)
ANION GAP: 6 (ref 5–15)
AST: 19 U/L (ref 15–41)
Alkaline Phosphatase: 80 U/L (ref 38–126)
BUN: 8 mg/dL (ref 6–20)
CHLORIDE: 110 mmol/L (ref 101–111)
CO2: 22 mmol/L (ref 22–32)
Calcium: 8.1 mg/dL — ABNORMAL LOW (ref 8.9–10.3)
Creatinine, Ser: 0.65 mg/dL (ref 0.44–1.00)
GFR calc Af Amer: >60 mL/min (ref >60)
GFR calc non Af Amer: >60 mL/min (ref >60)
GLUCOSE: 102 mg/dL — AB (ref 65–99)
POTASSIUM: 3.9 mmol/L (ref 3.5–5.1)
SODIUM: 138 mmol/L (ref 135–145)
Total Bilirubin: 2 mg/dL — ABNORMAL HIGH (ref 0.3–1.2)
Total Protein: 6 g/dL — ABNORMAL LOW (ref 6.5–8.1)

## 2015-11-11 LAB — RETICULOCYTES
RBC.: 2.25 MIL/uL — AB (ref 3.87–5.11)
RETIC COUNT ABSOLUTE: 445.5 10*3/uL — AB (ref 19.0–186.0)
Retic Ct Pct: 19.8 % — ABNORMAL HIGH (ref 0.4–3.1)

## 2015-11-11 MED ORDER — OXYCODONE HCL 5 MG PO TABS
15.0000 mg | ORAL_TABLET | ORAL | Status: DC
  Administered 2015-11-11 – 2015-11-12 (×6): 15 mg via ORAL
  Filled 2015-11-11 (×6): qty 3

## 2015-11-11 NOTE — Progress Notes (Signed)
SICKLE CELL SERVICE PROGRESS NOTE  GERALDIN DZIALO [id number] DOB: [birth year] DOA: 11/08/2015 PCP: Angelica Chessman, MD  Assessment/Plan: Principal Problem:   Sepsis ([place]) Active Problems:   Sickle cell crisis ([place])   Thrombocytosis ([place])   Vaginal bleeding   Hyperbilirubinemia   Pyrexia  1. Hb SS with crisis: Pt states that pain is improved and she feels that she can transition to oral analgesics. I will schedule Oxycodone and discontinue clinician assisited doses. Continue PCA for PRN use. Re-assess tomorrow for further weaning of medications.  2. Leukocytosis: Pt has had an increase in WBC today. However there is no evidence of infection. My suspicion is that this is due to increased bone marrow activity.  3. Acute blood loss anemia in setting of Anemia of chronic disease: Pt is about 2 weeks post partum and is having some ongoing vaginal bleeding.. She remains asymptomatic so will not transfuse at this time.  4. Chronic pain: Continue Methadone 5 mg HS.  5. Fever: Pt presented with a fever and sepsis physiology. She  Improved after IV hydration and concurrent antibiotics. However all cultures are negative so far. Will discontinue Vancomycin and observe.     Code Status: Full Code Family Communication: N/A Disposition Plan: Not yet ready for discharge  [place].  Pager [phone]. If 7PM-7AM, please contact night-coverage.  11/11/2015, 5:40 PM  LOS: 2 days   Interim History: Pt reports that her pain is improved. She also reports ongoing light vaginal bleeding post-partum and acknowledges that she knows that she can bleed up to 4-6 weeks post-partum. She also reports the sensation of "a ball" in th vaginal region. She denies any odor or vaginal discharge. She had the same complaint during an earlier admission and there were no objective findings.   Consultants:  None  Procedures:  None  Antibiotics:  Vancomycin 6/26 >>6/28  Ceftriaxone  6/25>>   Objective: Filed Vitals:   11/11/15 0738 11/11/15 1011 11/11/15 1234 11/11/15 1646  BP:  97/83    Pulse:  105    Temp:  98.7 F (37.1 C)    TempSrc:  Oral    Resp: 18 18 14 14   Height:      Weight:      SpO2: 96% 99% 98% 99%   Weight change: 4 lb 8 oz (2.041 kg)  Intake/Output Summary (Last 24 hours) at 11/11/15 1740 Last data filed at 11/11/15 1413  Gross per 24 hour  Intake    480 ml  Output   1850 ml  Net  -1370 ml    General: Alert, awake, oriented x3, in no acute distress.  HEENT: [place]/AT PEERL, EOMI, anicteric Neck: Trachea midline,  no masses, no thyromegal,y no JVD, no carotid bruit OROPHARYNX:  Moist, No exudate/ erythema/lesions.  Heart: Regular rate and rhythm, without murmurs, rubs, gallops, PMI non-displaced, no heaves or thrills on palpation.  Lungs: Clear to auscultation, no wheezing or rhonchi noted. No increased vocal fremitus resonant to percussion  Abdomen: Soft, nontender, nondistended, positive bowel sounds, no masses no hepatosplenomegaly noted.  Neuro: No focal neurological deficits noted cranial nerves II through XII grossly intact.  Strength at functional baseline in bilateral upper and lower extremities. Musculoskeletal: No warmth swelling or erythema around joints, no spinal tenderness noted. Psychiatric: Patient alert and oriented x3, good insight and cognition, good recent to remote recall. Genitalia: External inspection shows normal anatomy. No protrusion noted.    Data Reviewed: Basic Metabolic Panel:  Recent Labs Lab 11/08/15 1845 11/10/15 0402 11/11/15 [id number]  NA 135 140 138  K 3.9 4.5 3.9  CL 107 112* 110  CO2 22 24 22   GLUCOSE 88 92 102*  BUN 9 11 8   CREATININE 0.64 0.52 0.65  CALCIUM 9.0 8.5* 8.1*   Liver Function Tests:  Recent Labs Lab 11/08/15 1845 11/10/15 0402 11/11/15 0334  AST 23 19 19   ALT 21 21 18   ALKPHOS 89 76 80  BILITOT 2.7* 2.1* 2.0*  PROT 7.5 6.9 6.0*  ALBUMIN 3.7 3.5 3.1*   No results for  input(s): LIPASE, AMYLASE in the last 168 hours. No results for input(s): AMMONIA in the last 168 hours. CBC:  Recent Labs Lab 11/08/15 1845 11/10/15 0402 11/11/15 0334  WBC 28.7* 12.3* 18.3*  NEUTROABS 25.6* 8.6* 13.3*  HGB 8.6* 7.1* 6.5*  HCT 24.9* 20.6* 19.4*  MCV 87.1 85.5 85.5  PLT 633* 522* 520*   Cardiac Enzymes: No results for input(s): CKTOTAL, CKMB, CKMBINDEX, TROPONINI in the last 168 hours. BNP (last 3 results) No results for input(s): BNP in the last 8760 hours.  ProBNP (last 3 results) No results for input(s): PROBNP in the last 8760 hours.  CBG: No results for input(s): GLUCAP in the last 168 hours.  Recent Results (from the past 240 hour(s))  Blood culture (routine x 2)     Status: None (Preliminary result)   Collection Time: 11/08/15  7:45 PM  Result Value Ref Range Status   Specimen Description BLOOD LEFT HAND  Final   Special Requests BOTTLES DRAWN AEROBIC AND ANAEROBIC 5ML  Final   Culture   Final    NO GROWTH 2 DAYS Performed at [facility]    Report Status PENDING  Incomplete  Blood culture (routine x 2)     Status: None (Preliminary result)   Collection Time: 11/08/15  7:52 PM  Result Value Ref Range Status   Specimen Description BLOOD RIGHT HAND  Final   Special Requests BOTTLES DRAWN AEROBIC ONLY 7ML  Final   Culture   Final    NO GROWTH 2 DAYS Performed at [facility]    Report Status PENDING  Incomplete  Urine culture     Status: Abnormal   Collection Time: 11/08/15  8:46 PM  Result Value Ref Range Status   Specimen Description URINE, CLEAN CATCH  Final   Special Requests NONE  Final   Culture MULTIPLE SPECIES PRESENT, SUGGEST RECOLLECTION (A)  Final   Report Status 11/10/2015 FINAL  Final  MRSA PCR Screening     Status: None   Collection Time: 11/09/15  4:07 AM  Result Value Ref Range Status   MRSA by PCR NEGATIVE NEGATIVE Final    Comment:        The GeneXpert MRSA Assay (FDA approved for NASAL specimens only),  is one component of a comprehensive MRSA colonization surveillance program. It is not intended to diagnose MRSA infection nor to guide or monitor treatment for MRSA infections.      Studies: Dg Chest 2 View  11/08/2015  CLINICAL DATA:  25 year old female with sickle cell crisis presenting with chest pain and shortness of breath EXAM: CHEST  2 VIEW COMPARISON:  None FINDINGS: Two views of the chest do not demonstrate a focal consolidation. There is no pleural effusion or pneumothorax. Top-normal cardiac silhouette. 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 There is central depression of the vertebral body endplates with H shaped vertebra compatible with chronic changes of sickle cell. No acute fracture. IMPRESSION:  No acute cardiopulmonary process. Electronically Signed   By: Anner Crete M.D.   On: 11/08/2015 19:48   Ct Angio Chest Pe W/cm &/or Wo Cm  11/09/2015  CLINICAL DATA:  Chest pain. Sickle cell disease. Two weeks postpartum. Leukocytosis. EXAM: CT ANGIOGRAPHY CHEST WITH CONTRAST TECHNIQUE: Multidetector CT imaging of the chest was performed using the standard protocol during bolus administration of intravenous contrast. Multiplanar CT image reconstructions and MIPs were obtained to evaluate the vascular anatomy. CONTRAST:  100 mL Isovue 370 intravenous COMPARISON:  Radiographs 11/08/2015 FINDINGS: Cardiovascular: Suboptimal opacification of the pulmonary vasculature. No large or central pulmonary emboli. No peripheral emboli are evident, but sensitivity for detection is reduced. Lungs: Clear except for minimal atelectatic appearing posterior lung base opacities. Mild linear scarring in the right middle lobe. Central airways: Patent and normal in caliber Effusions: None Lymphadenopathy: None Esophagus: Unremarkable Upper abdomen: No significant abnormality Musculoskeletal: Sharply delimited central endplate depressions resulting in  H-shaped vertebral bodies, a known association with sickle cell disease, due to microvascular endplate ischemia. Review of the MIP images confirms the above findings. IMPRESSION: Negative for acute pulmonary embolism although there is reduced sensitivity for detection of small peripheral emboli due to suboptimal pulmonary arterial opacification. No acute findings. Electronically Signed   By: Andreas Newport M.D.   On: 11/09/2015 02:41   Ir Fluoro Guide Cv Line Right  10/16/2015  CLINICAL DATA:  Sickle cell anemia with crisis during pregnancy. Poor IV access and need for PICC line. EXAM: POWER PICC LINE PLACEMENT WITH ULTRASOUND AND FLUOROSCOPIC GUIDANCE FLUOROSCOPY TIME:  54 seconds. PROCEDURE: The patient was advised of the possible risks and complications and agreed to undergo the procedure. The patient was then brought to the angiographic suite for the procedure. The right arm was prepped with chlorhexidine, draped in the usual sterile fashion using maximum barrier technique (cap and mask, sterile gown, sterile gloves, large sterile sheet, hand hygiene and cutaneous antisepsis) and infiltrated locally with 1% Lidocaine. Ultrasound demonstrated patency of the right brachial vein, and this was documented with an image. Under real-time ultrasound guidance, this vein was accessed with a 21 gauge micropuncture needle and image documentation was performed. A 0.018 wire was introduced in to the vein. Over this, a 5.0 [place] dual lumen power injectable PICC was advanced to the lower SVC/right atrial junction. Fluoroscopy during the procedure and fluoro spot radiograph confirms appropriate catheter position. The catheter was flushed and covered with a sterile dressing. Catheter length: 33 cm COMPLICATIONS: None IMPRESSION: Successful right arm power injectable PICC line placement with ultrasound and fluoroscopic guidance. The catheter is ready for use. Electronically Signed   By: Aletta Edouard M.D.   On: 10/16/2015  15:43   Ir US Guide Vasc Access Right  10/16/2015  CLINICAL DATA:  Sickle cell anemia with crisis during pregnancy. Poor IV access and need for PICC line. EXAM: POWER PICC LINE PLACEMENT WITH ULTRASOUND AND FLUOROSCOPIC GUIDANCE FLUOROSCOPY TIME:  54 seconds. PROCEDURE: The patient was advised of the possible risks and complications and agreed to undergo the procedure. The patient was then brought to the angiographic suite for the procedure. The right arm was prepped with chlorhexidine, draped in the usual sterile fashion using maximum barrier technique (cap and mask, sterile gown, sterile gloves, large sterile sheet, hand hygiene and cutaneous antisepsis) and infiltrated locally with 1% Lidocaine. Ultrasound demonstrated patency of the right brachial vein, and this was documented with an image. Under real-time ultrasound guidance, this vein was accessed with a 21 gauge  micropuncture needle and image documentation was performed. A 0.018 wire was introduced in to the vein. Over this, a 5.0 [place] dual lumen power injectable PICC was advanced to the lower SVC/right atrial junction. Fluoroscopy during the procedure and fluoro spot radiograph confirms appropriate catheter position. The catheter was flushed and covered with a sterile dressing. Catheter length: 33 cm COMPLICATIONS: None IMPRESSION: Successful right arm power injectable PICC line placement with ultrasound and fluoroscopic guidance. The catheter is ready for use. Electronically Signed   By: Aletta Edouard M.D.   On: 10/16/2015 15:43   [place] Mfm Fetal Bpp Wo Non Stress  10/29/2015  OBSTETRICAL ULTRASOUND: This exam was performed within a [place] [facility]. The OB US report was generated in the AS system, and faxed to the ordering physician.  This report is available in the [facility]. See the AS Obstetric US report via the Image Link.  [place] Mfm Fetal Bpp Wo Non Stress  10/20/2015  OBSTETRICAL ULTRASOUND: This exam was performed within a  [place] [facility] Department. The OB US report was generated in the AS system, and faxed to the ordering physician.  This report is available in the [facility]. See the AS Obstetric US report via the Image Link.  [place] Mfm Fetal Bpp Wo Non Stress  10/13/2015  OBSTETRICAL ULTRASOUND: This exam was performed within a [place] [facility] Department. The OB US report was generated in the AS system, and faxed to the ordering physician.  This report is available in the [facility]. See the AS Obstetric US report via the Image Link.  [place] Mfm Ob Follow Up  10/13/2015  OBSTETRICAL ULTRASOUND: This exam was performed within a [place] [facility] Department. The OB US report was generated in the AS system, and faxed to the ordering physician.  This report is available in the [facility]. See the AS Obstetric US report via the Image Link.  [place] Mfm Ob Limited  10/27/2015  OBSTETRICAL ULTRASOUND: This exam was performed within a [place] [facility] Department. The OB US report was generated in the AS system, and faxed to the ordering physician.  This report is available in the [facility]. See the AS Obstetric US report via the Image Link.   Scheduled Meds: . cefTRIAXone (ROCEPHIN)  IV  2 g Intravenous Q24H  . enoxaparin (LOVENOX) injection  40 mg Subcutaneous Q24H  . folic acid  1 mg Oral Daily  . HYDROmorphone   Intravenous Q4H  . ketorolac  30 mg Intravenous Q6H  . methadone  5 mg Oral Daily  . oxyCODONE  15 mg Oral Q4H  . senna-docusate  1 tablet Oral BID   Continuous Infusions: . dextrose 5 % and 0.45% NaCl 125 mL/hr at 11/11/15 0255    Time spent 30 minutes. Greater than 50% involved face to face contact with the patient for assessment, counseling and coordination of care.

## 2015-11-11 NOTE — Progress Notes (Signed)
Critical lab value,hgb= 6.5, on call provider notified. No new orders at this time. Will continue to monitor.pro

## 2015-11-12 NOTE — Discharge Summary (Signed)
[name] MRN: [id number] DOB/AGE: 11/01/1990 24 y.o.  Admit date: 11/08/2015 Discharge date: 11/12/2015  Primary Care Physician:  Angelica Chessman, MD   Discharge Diagnoses:   Patient Active Problem List   Diagnosis Date Noted  . Depression 01/06/2011    Priority: High  . Sickle cell disease ([place]) 01/08/2009    Priority: High  . Sickle cell crisis ([place]) 11/09/2015  . Sepsis ([place]) 11/09/2015  . Thrombocytosis ([place]) 11/09/2015  . Vaginal bleeding 11/09/2015  . Hyperbilirubinemia 11/09/2015  . Pyrexia   . Vaginal delivery 10/31/2015  . Hb-SS disease with crisis ([place])   . Sickle cell pain crisis ([place]) 10/26/2015  . Positive GBS test 10/18/2015  . Narcotic dependence ([place]) 10/06/2015  . 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 High risk for intrapartum complications, antepartum 08/12/2015  . Chronic pain 08/04/2015  . Cocaine use 07/24/2015  . Maternal sickle cell anemia affecting pregnancy, antepartum ([place]) 07/14/2015  . Anemia of chronic disease   . Migraines 11/08/2011  . GERD (gastroesophageal reflux disease) 02/17/2011  . Trichotillomania 01/08/2009    DISCHARGE MEDICATION:   Medication List    TAKE these medications        calcium carbonate 500 MG chewable tablet  Commonly known as:  TUMS - dosed in mg elemental calcium  Chew 2 tablets by mouth 2 (two) times daily as needed for indigestion or heartburn.     folic acid 1 MG tablet  Commonly known as:  FOLVITE  Take 1 mg by mouth daily.     ibuprofen 600 MG tablet  Commonly known as:  ADVIL,MOTRIN  Take 1 tablet (600 mg total) by mouth every 6 (six) hours.     methadone 5 MG tablet  Commonly known as:  DOLOPHINE  Take 1 tablet (5 mg total) by mouth daily.     oxyCODONE 15 MG immediate release tablet  Commonly known as:  ROXICODONE  Take 1 tablet (15 mg total) by mouth 2 (two) times daily.     prenatal  multivitamin Tabs tablet  Take 1 tablet by mouth daily at 12 noon.          Consults:     SIGNIFICANT DIAGNOSTIC STUDIES:  Dg Chest 2 View  11/08/2015  CLINICAL DATA:  26 year old female with sickle cell crisis presenting with chest pain and shortness of breath EXAM: CHEST  2 VIEW COMPARISON:  None FINDINGS: Two views of the chest do not demonstrate a focal consolidation. There is no pleural effusion or pneumothorax. Top-normal cardiac silhouette. 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 There is central depression of the vertebral body endplates with H shaped vertebra compatible with chronic changes of sickle cell. No acute fracture. IMPRESSION: No acute cardiopulmonary process. Electronically Signed   By: Anner Crete M.D.   On: 11/08/2015 19:48   Ct Angio Chest Pe W/cm &/or Wo Cm  11/09/2015  CLINICAL DATA:  Chest pain. Sickle cell disease. Two weeks postpartum. Leukocytosis. EXAM: CT ANGIOGRAPHY CHEST WITH CONTRAST TECHNIQUE: Multidetector CT imaging of the chest was performed using the standard protocol during bolus administration of intravenous contrast. Multiplanar CT image reconstructions and MIPs were obtained to evaluate the vascular anatomy. CONTRAST:  100 mL Isovue 370 intravenous COMPARISON:  Radiographs 11/08/2015 FINDINGS: Cardiovascular: Suboptimal opacification of the pulmonary vasculature. No large or central pulmonary emboli. No peripheral emboli are evident, but sensitivity for detection is reduced. Lungs: Clear except  for minimal atelectatic appearing posterior lung base opacities. Mild linear scarring in the right middle lobe. Central airways: Patent and normal in caliber Effusions: None Lymphadenopathy: None Esophagus: Unremarkable Upper abdomen: No significant abnormality Musculoskeletal: Sharply delimited central endplate depressions resulting in H-shaped vertebral bodies, a known association with sickle cell  disease, due to microvascular endplate ischemia. Review of the MIP images confirms the above findings. IMPRESSION: Negative for acute pulmonary embolism although there is reduced sensitivity for detection of small peripheral emboli due to suboptimal pulmonary arterial opacification. No acute findings. Electronically Signed   By: Andreas Newport M.D.   On: 11/09/2015 02:41   Ir Fluoro Guide Cv Line Right  10/16/2015  CLINICAL DATA:  Sickle cell anemia with crisis during pregnancy. Poor IV access and need for PICC line. EXAM: POWER PICC LINE PLACEMENT WITH ULTRASOUND AND FLUOROSCOPIC GUIDANCE FLUOROSCOPY TIME:  54 seconds. PROCEDURE: The patient was advised of the possible risks and complications and agreed to undergo the procedure. The patient was then brought to the angiographic suite for the procedure. The right arm was prepped with chlorhexidine, draped in the usual sterile fashion using maximum barrier technique (cap and mask, sterile gown, sterile gloves, large sterile sheet, hand hygiene and cutaneous antisepsis) and infiltrated locally with 1% Lidocaine. Ultrasound demonstrated patency of the right brachial vein, and this was documented with an image. Under real-time ultrasound guidance, this vein was accessed with a 21 gauge micropuncture needle and image documentation was performed. A 0.018 wire was introduced in to the vein. Over this, a 5.0 [place] dual lumen power injectable PICC was advanced to the lower SVC/right atrial junction. Fluoroscopy during the procedure and fluoro spot radiograph confirms appropriate catheter position. The catheter was flushed and covered with a sterile dressing. Catheter length: 33 cm COMPLICATIONS: None IMPRESSION: Successful right arm power injectable PICC line placement with ultrasound and fluoroscopic guidance. The catheter is ready for use. Electronically Signed   By: Aletta Edouard M.D.   On: 10/16/2015 15:43   Ir US Guide Vasc Access Right  10/16/2015  CLINICAL  DATA:  Sickle cell anemia with crisis during pregnancy. Poor IV access and need for PICC line. EXAM: POWER PICC LINE PLACEMENT WITH ULTRASOUND AND FLUOROSCOPIC GUIDANCE FLUOROSCOPY TIME:  54 seconds. PROCEDURE: The patient was advised of the possible risks and complications and agreed to undergo the procedure. The patient was then brought to the angiographic suite for the procedure. The right arm was prepped with chlorhexidine, draped in the usual sterile fashion using maximum barrier technique (cap and mask, sterile gown, sterile gloves, large sterile sheet, hand hygiene and cutaneous antisepsis) and infiltrated locally with 1% Lidocaine. Ultrasound demonstrated patency of the right brachial vein, and this was documented with an image. Under real-time ultrasound guidance, this vein was accessed with a 21 gauge micropuncture needle and image documentation was performed. A 0.018 wire was introduced in to the vein. Over this, a 5.0 [place] dual lumen power injectable PICC was advanced to the lower SVC/right atrial junction. Fluoroscopy during the procedure and fluoro spot radiograph confirms appropriate catheter position. The catheter was flushed and covered with a sterile dressing. Catheter length: 33 cm COMPLICATIONS: None IMPRESSION: Successful right arm power injectable PICC line placement with ultrasound and fluoroscopic guidance. The catheter is ready for use. Electronically Signed   By: Aletta Edouard M.D.   On: 10/16/2015 15:43   [place] Mfm Fetal Bpp Wo Non Stress  10/29/2015  OBSTETRICAL ULTRASOUND: This exam was performed within a [facility].  The OB US report was generated in the AS system, and faxed to the ordering physician.  This report is available in the [facility]. See the AS Obstetric US report via the Image Link.  [place] Mfm Fetal Bpp Wo Non Stress  10/20/2015  OBSTETRICAL ULTRASOUND: This exam was performed within a [facility] Department. The OB US report was  generated in the AS system, and faxed to the ordering physician.  This report is available in the [facility]. See the AS Obstetric US report via the Image Link.  [place] Mfm Fetal Bpp Wo Non Stress  10/13/2015  OBSTETRICAL ULTRASOUND: This exam was performed within a [facility] Department. The OB US report was generated in the AS system, and faxed to the ordering physician.  This report is available in the [facility]. See the AS Obstetric US report via the Image Link.  [place] Mfm Ob Follow Up  10/13/2015  OBSTETRICAL ULTRASOUND: This exam was performed within a [facility] Department. The OB US report was generated in the AS system, and faxed to the ordering physician.  This report is available in the [facility]. See the AS Obstetric US report via the Image Link.  [place] Mfm Ob Limited  10/27/2015  OBSTETRICAL ULTRASOUND: This exam was performed within a [facility]trasound Department. The OB US report was generated in the AS system, and faxed to the ordering physician.  This report is available in the [facility]. See the AS Obstetric US report via the Image Link.      Recent Results (from the past 240 hour(s))  Blood culture (routine x 2)     Status: None (Preliminary result)   Collection Time: 11/08/15  7:45 PM  Result Value Ref Range Status   Specimen Description BLOOD LEFT HAND  Final   Special Requests BOTTLES DRAWN AEROBIC AND ANAEROBIC 5ML  Final   Culture   Final    NO GROWTH 3 DAYS Performed at [facility]    Report Status PENDING  Incomplete  Blood culture (routine x 2)     Status: None (Preliminary result)   Collection Time: 11/08/15  7:52 PM  Result Value Ref Range Status   Specimen Description BLOOD RIGHT HAND  Final   Special Requests BOTTLES DRAWN AEROBIC ONLY 7ML  Final   Culture   Final    NO GROWTH 3 DAYS Performed at [facility]    Report Status PENDING  Incomplete  Urine culture     Status: Abnormal   Collection Time: 11/08/15   8:46 PM  Result Value Ref Range Status   Specimen Description URINE, CLEAN CATCH  Final   Special Requests NONE  Final   Culture MULTIPLE SPECIES PRESENT, SUGGEST RECOLLECTION (A)  Final   Report Status 11/10/2015 FINAL  Final  MRSA PCR Screening     Status: None   Collection Time: 11/09/15  4:07 AM  Result Value Ref Range Status   MRSA by PCR NEGATIVE NEGATIVE Final    Comment:        The GeneXpert MRSA Assay (FDA approved for NASAL specimens only), is one component of a comprehensive MRSA colonization surveillance program. It is not intended to diagnose MRSA infection nor to guide or monitor treatment for MRSA infections.     BRIEF ADMITTING H & P: Landynn Macadams Strohmaier is a 26 y.o. female with medical history significant of sickle cell anemia requiring blood transfusions in the past, miscarriages, and approximately 2 weeks postpartum  from spontaneous vaginal delivery; who presents with series of complaints including bleeding, chest pain, central body aching, cramping, feeling drained that started today when she woke up. Yesterday patient noted feeling in her normal state of health except for the complaints of vaginal bleeding. This morning however she woke up this morning and felt as though her head, shoulders, back, legs and groin area were in severe constant achy pain. She tried taking her regular home medications and methadone, ibuprofen, use heating pad, and took a nap without relief of symptoms. Patient states that she's been waking up with cold sweats middle of the night and thought it was related to her pain medication. Patient reports utilizing tampons anywhere from 3-6 per day she's been bleeding off and on since she delivered her baby. She expresses that she was told not to use tampons after delivery, but she has used them anyway. Associated symptoms included some complaints of centralized chest pain with generalized shortness of breath with activity, but patient denies any  significant cough, hemoptysis, lower leg swelling, or weight gain.   Hospital Course:  Opiate tolerant patient with Hb SS admitted with sickle cell crisis. Please note the patient is recently (2 weeks) postpartum and continue to have some postpartum bleeding. This is likely what triggered crisis. Nevertheless she was started on Dilaudid PCA, Toradol and IV fluids. As her pain improved she was transitioned to oral analgesics. At the time of discharge the patient rates her pain as 5-6/10. She is discharged on her prehospitalization oral regimen. The patient also presented with a fever and has physiology consistent with sepsis. She was started on IV antibiotics including Rocephin and vancomycin. However all cultures were negative and the antibiotics were discontinued. The patient is afebrile without antibiotics and is being discharged home without any antibiotics. I suspect the fever was associated with her crisis. The patient also was having some postpartum bleeding which is expected within a 2 week postpartum. From her delivery. I spoke with GYN who confirmed that the patient can expect to have bleeding up to 4-6 weeks for postpartum. Nevertheless her hemoglobin is at baseline at the time of discharge. The patient also complained of feeling "ball" in the vaginal area. She was urinating well without any bleeding and there was no vaginal discharge. Inspection of external genitalia revealed normal genitalia.    Present on Admission:  . Sickle cell crisis ([place]) . Sepsis ([place]) . Thrombocytosis ([place]) . Vaginal bleeding . Hyperbilirubinemia  Disposition and Follow-up:  Pt discharged in good condition. She has an appointment to F/U with Gyn on 7/24 and [place] on 7/31.      Discharge Instructions    Activity as tolerated - No restrictions    Complete by:  As directed      Call MD for:  temperature >100.4    Complete by:  As directed      Diet general    Complete by:  As directed             DISCHARGE EXAM:  General: Alert, awake, oriented x3, in no apprent distress.   Vital signs: Blood pressure 132/90, pulse 96, temperature 98.4 F (36.9 C), temperature source Oral, resp. rate 14, height 5\' 1"  (1.549 m), weight 188 lb (85.276 kg), last menstrual period 10/31/2015, SpO2 96 %, currently breastfeeding. HEENT: [place]/AT PEERL, EOMI, anicteric Neck: Trachea midline, no masses, no thyromegal,y no JVD, no carotid bruit OROPHARYNX: Moist, No exudate/ erythema/lesions.  Heart: Regular rate and rhythm, without murmurs, rubs, gallops or S3.  PMI non-displaced. Exam reveals no decreased pulses. Pulmonary/Chest: Normal effort. Breath sounds normal. No. Apnea. Clear to auscultation,no stridor,  no wheezing and no rhonchi noted. No respiratory distress and no tenderness noted. Abdomen: Soft, nontender, nondistended, normal bowel sounds, no masses no hepatosplenomegaly noted. No fluid wave and no ascites. There is no guarding or rebound. Neuro: Alert and oriented to person, place and time. Normal motor skills, Displays no atrophy or tremors and exhibits normal muscle tone.  No focal neurological deficits noted cranial nerves II through XII grossly intact. No sensory deficit noted. DTRs 2+ bilaterally upper and lower extremities. Strength at baseline in bilateral upper and lower extremities. Gait normal. Musculoskeletal: No warmth swelling or erythema around joints, no spinal tenderness noted. Psychiatric: Patient alert and oriented x3, good insight and cognition, good recent to remote recall. Mood, memory, affect and judgement normal Skin: Skin is warm and dry. No bruising, no ecchymosis and no rash noted. Pt is not diaphoretic. No erythema. No pallor Genitalia: External genitalia normal. Vaginal vault shows healthy tissue on visual inspection. No foul smelling discharge.      Recent Labs  11/10/15 0402 11/11/15 0334  NA 140 138  K 4.5 3.9  CL 112* 110  CO2 24 22  GLUCOSE 92 102*  BUN  11 8  CREATININE 0.52 0.65  CALCIUM 8.5* 8.1*    Recent Labs  11/10/15 0402 11/11/15 0334  AST 19 19  ALT 21 18  ALKPHOS 76 80  BILITOT 2.1* 2.0*  PROT 6.9 6.0*  ALBUMIN 3.5 3.1*   No results for input(s): LIPASE, AMYLASE in the last 72 hours.  Recent Labs  11/10/15 0402 11/11/15 0334  WBC 12.3* 18.3*  NEUTROABS 8.6* 13.3*  HGB 7.1* 6.5*  HCT 20.6* 19.4*  MCV 85.5 85.5  PLT 522* 520*     Total time spent including face to face and decision making was greater than 30 minutes  Signed: MATTHEWS,MICHELLE A. 11/12/2015, 12:16 PM

## 2015-11-12 NOTE — Progress Notes (Signed)
Patient ambulatory around entire length of unit.  O2 sat maintained between 96 and 100% on room air.

## 2015-11-12 NOTE — Care Management Important Message (Signed)
Important Message  Patient Details  Name: LEASHIA POLCYN MRN: [id number] Date of Birth: January 15, 1991   Medicare Important Message Given:  Yes    Camillo Flaming 11/12/2015, 9:01 AMImportant Message  Patient Details  Name: TAQUANA WACHSMAN MRN: [id number] Date of Birth: [birth year]   Medicare Important Message Given:  Yes    Camillo Flaming 11/12/2015, 9:00 AM

## 2015-11-14 LAB — CULTURE, BLOOD (ROUTINE X 2)
CULTURE: NO GROWTH
CULTURE: NO GROWTH

## 2015-11-18 ENCOUNTER — Other Ambulatory Visit: Payer: Self-pay | Admitting: Internal Medicine

## 2015-11-18 ENCOUNTER — Other Ambulatory Visit: Payer: Self-pay | Admitting: Hematology

## 2015-11-18 DIAGNOSIS — G8929 Other chronic pain: Secondary | ICD-10-CM

## 2015-11-18 DIAGNOSIS — D571 Sickle-cell disease without crisis: Secondary | ICD-10-CM

## 2015-11-18 MED ORDER — OXYCODONE HCL 15 MG PO TABS: 15.0000 mg | ORAL_TABLET | ORAL | Status: DC | PRN

## 2015-11-18 MED ORDER — METHADONE HCL 5 MG PO TABS: 5.0000 mg | ORAL_TABLET | Freq: Every day | ORAL | Status: DC

## 2015-11-18 NOTE — Telephone Encounter (Signed)
Medication refill request for oxycodone 15 mg immediate release and methadone 5mg  / LOV 09/22/2015 /

## 2015-11-25 ENCOUNTER — Telehealth: Payer: Self-pay | Admitting: *Deleted

## 2015-11-25 NOTE — Telephone Encounter (Signed)
Refill request for oxycodone. LOV 09/22/2015 with jegede. Patient is also asking to start back on hydrea. Please advise. Thanks!

## 2015-11-25 NOTE — Telephone Encounter (Signed)
Patient called and needs a refill of her Oxycodone and she wants to start back on the Hydrea. She states she is not breast feeding anymore. Please advise the provder. Thanks .

## 2015-12-01 ENCOUNTER — Other Ambulatory Visit: Payer: Self-pay | Admitting: Internal Medicine

## 2015-12-01 DIAGNOSIS — D571 Sickle-cell disease without crisis: Secondary | ICD-10-CM

## 2015-12-01 MED ORDER — OXYCODONE HCL 15 MG PO TABS: 15.0000 mg | ORAL_TABLET | ORAL | Status: DC | PRN

## 2015-12-01 MED FILL — VALACYCLOVIR HCL 500 MG TAB: 500 | 30 days supply | Qty: 60 | Fill #0

## 2015-12-01 MED FILL — oxyCODONE HCL 15 MG TABS: 15 | 15 days supply | Qty: 90 | Fill #0

## 2015-12-01 MED FILL — IBUPROFEN 600 MG TABLET: 600 | 7 days supply | Qty: 30 | Fill #0

## 2015-12-01 NOTE — Telephone Encounter (Signed)
Dr. Doreene Burke Please advise. Thanks!

## 2015-12-04 ENCOUNTER — Other Ambulatory Visit: Payer: Self-pay | Admitting: Internal Medicine

## 2015-12-07 ENCOUNTER — Ambulatory Visit: Payer: Medicaid Other | Admitting: Obstetrics and Gynecology

## 2015-12-08 ENCOUNTER — Encounter (HOSPITAL_COMMUNITY): Payer: Self-pay

## 2015-12-08 ENCOUNTER — Emergency Department (HOSPITAL_COMMUNITY)
Admission: EM | Admit: 2015-12-08 | Discharge: 2015-12-09 | Disposition: A | Discharge status: Home / Self Care | Payer: Medicare Other | Attending: Emergency Medicine | Admitting: Emergency Medicine

## 2015-12-08 ENCOUNTER — Emergency Department (HOSPITAL_COMMUNITY): Payer: Medicare Other

## 2015-12-08 DIAGNOSIS — R509 Fever, unspecified: Secondary | ICD-10-CM | POA: Diagnosis not present

## 2015-12-08 DIAGNOSIS — F329 Major depressive disorder, single episode, unspecified: Secondary | ICD-10-CM | POA: Diagnosis not present

## 2015-12-08 DIAGNOSIS — D5700 Hb-SS disease with crisis, unspecified: Secondary | ICD-10-CM | POA: Diagnosis not present

## 2015-12-08 DIAGNOSIS — Z7982 Long term (current) use of aspirin: Secondary | ICD-10-CM | POA: Diagnosis not present

## 2015-12-08 DIAGNOSIS — N939 Abnormal uterine and vaginal bleeding, unspecified: Secondary | ICD-10-CM | POA: Diagnosis present

## 2015-12-08 DIAGNOSIS — Z87891 Personal history of nicotine dependence: Secondary | ICD-10-CM | POA: Diagnosis not present

## 2015-12-08 DIAGNOSIS — F439 Reaction to severe stress, unspecified: Secondary | ICD-10-CM | POA: Diagnosis not present

## 2015-12-08 DIAGNOSIS — Z791 Long term (current) use of non-steroidal anti-inflammatories (NSAID): Secondary | ICD-10-CM | POA: Diagnosis not present

## 2015-12-08 DIAGNOSIS — Z79899 Other long term (current) drug therapy: Secondary | ICD-10-CM | POA: Diagnosis not present

## 2015-12-08 DIAGNOSIS — D571 Sickle-cell disease without crisis: Secondary | ICD-10-CM | POA: Diagnosis not present

## 2015-12-08 LAB — CBC WITH DIFFERENTIAL/PLATELET
BASOS PCT: 1 %
Basophils Absolute: 0.1 10*3/uL (ref 0.0–0.1)
EOS ABS: 0.6 10*3/uL (ref 0.0–0.7)
Eosinophils Relative: 5 %
HCT: 21.3 % — ABNORMAL LOW (ref 36.0–46.0)
Hemoglobin: 7.3 g/dL — ABNORMAL LOW (ref 12.0–15.0)
Lymphocytes Relative: 20 %
Lymphs Abs: 2.3 10*3/uL (ref 0.7–4.0)
MCH: 28.7 pg (ref 26.0–34.0)
MCHC: 34.3 g/dL (ref 30.0–36.0)
MCV: 83.9 fL (ref 78.0–100.0)
MONO ABS: 1.4 10*3/uL — AB (ref 0.1–1.0)
Monocytes Relative: 12 %
NEUTROS ABS: 7.3 10*3/uL (ref 1.7–7.7)
NEUTROS PCT: 62 %
PLATELETS: 358 10*3/uL (ref 150–400)
RBC: 2.54 MIL/uL — ABNORMAL LOW (ref 3.87–5.11)
RDW: 19.2 % — AB (ref 11.5–15.5)
WBC: 11.7 10*3/uL — ABNORMAL HIGH (ref 4.0–10.5)

## 2015-12-08 LAB — WET PREP, GENITAL
Clue Cells Wet Prep HPF POC: NONE SEEN
Sperm: NONE SEEN
Trich, Wet Prep: NONE SEEN
YEAST WET PREP: NONE SEEN

## 2015-12-08 LAB — COMPREHENSIVE METABOLIC PANEL
ALK PHOS: 53 U/L (ref 38–126)
ALT: 15 U/L (ref 14–54)
AST: 25 U/L (ref 15–41)
Albumin: 4.4 g/dL (ref 3.5–5.0)
Anion gap: 7 (ref 5–15)
BUN: 5 mg/dL — ABNORMAL LOW (ref 6–20)
CALCIUM: 8.7 mg/dL — AB (ref 8.9–10.3)
CO2: 23 mmol/L (ref 22–32)
CREATININE: 0.75 mg/dL (ref 0.44–1.00)
Chloride: 108 mmol/L (ref 101–111)
GFR calc non Af Amer: >60 mL/min (ref >60)
GLUCOSE: 98 mg/dL (ref 65–99)
Potassium: 3.3 mmol/L — ABNORMAL LOW (ref 3.5–5.1)
SODIUM: 138 mmol/L (ref 135–145)
Total Bilirubin: 2.2 mg/dL — ABNORMAL HIGH (ref 0.3–1.2)
Total Protein: 7.5 g/dL (ref 6.5–8.1)

## 2015-12-08 MED ORDER — ONDANSETRON HCL 4 MG/2ML IJ SOLN
4.0000 mg | Freq: Once | INTRAMUSCULAR | Status: AC
  Administered 2015-12-08: 4 mg via INTRAVENOUS
  Filled 2015-12-08: qty 2

## 2015-12-08 MED ORDER — DIPHENHYDRAMINE HCL 50 MG/ML IJ SOLN
12.5000 mg | Freq: Once | INTRAMUSCULAR | Status: AC
  Administered 2015-12-09: 12.5 mg via INTRAVENOUS
  Filled 2015-12-08: qty 1

## 2015-12-08 MED ORDER — DIPHENHYDRAMINE HCL 50 MG/ML IJ SOLN
12.5000 mg | Freq: Once | INTRAMUSCULAR | Status: AC
Start: 2015-12-08 — End: 2015-12-08
  Administered 2015-12-08: 12.5 mg via INTRAVENOUS
  Filled 2015-12-08: qty 1

## 2015-12-08 MED ORDER — SODIUM CHLORIDE 0.45 % IV SOLN
INTRAVENOUS | Status: DC
  Administered 2015-12-08: 23:00:00 via INTRAVENOUS

## 2015-12-08 MED ORDER — HYDROMORPHONE HCL 2 MG/ML IJ SOLN: 0.0313 mg/kg | INTRAMUSCULAR | Status: AC

## 2015-12-08 MED ORDER — HYDROMORPHONE HCL 2 MG/ML IJ SOLN
0.0250 mg/kg | INTRAMUSCULAR | Status: AC
  Administered 2015-12-08: 1.9 mg via INTRAVENOUS
  Filled 2015-12-08: qty 1

## 2015-12-08 MED ORDER — HYDROMORPHONE HCL 2 MG/ML IJ SOLN
0.0313 mg/kg | INTRAMUSCULAR | Status: AC
  Administered 2015-12-09: 2.3 mg via INTRAVENOUS
  Filled 2015-12-08: qty 2

## 2015-12-08 MED ORDER — HYDROMORPHONE HCL 2 MG/ML IJ SOLN: 0.0250 mg/kg | INTRAMUSCULAR | Status: AC

## 2015-12-08 MED ORDER — KETOROLAC TROMETHAMINE 30 MG/ML IJ SOLN
30.0000 mg | INTRAMUSCULAR | Status: AC
  Administered 2015-12-08: 30 mg via INTRAVENOUS
  Filled 2015-12-08: qty 1

## 2015-12-08 NOTE — ED Provider Notes (Signed)
[name] Provider Note   CSN: [id number] Arrival date & time: 12/08/15  2001  First Provider Contact:  First MD Initiated Contact with Patient 12/08/15 2145        History   Chief Complaint Chief Complaint  Patient presents with  . Sickle Cell Pain Crisis    RIGHT HIP SINCE SUNDAY  . Vaginal Bleeding    SINCE 10/31/15    HPI [name] is a 25 y.o. female.  She presents for evaluation of typical pain from sickle cell, in her low back, which is radiating to her right groin. Symptom onset 2 days ago and she believes it is caused by "stress". She has people, friends and family with whom she can relate to. She has the name of a therapist that she can see if needed. She delivered a child about 6 weeks ago vaginally, and has had persistent vaginal bleeding since that time. She also feels like "there is a ball" at the opening to the vagina. She had an appointment with her gynecologist yesterday, but deferred that because she was at home with her child having a birthday party. She denies fever, chills, nausea, vomiting, cough, shortness of breath or chest pain. She's been using her usual narcotic medicine at home, both methadone and oxycodone, without relief. There are no other known modifying factors.   HPI  Past Medical History:  Diagnosis Date  . Blood transfusion    "lots"  . Blood transfusion without reported diagnosis   . Chronic back pain    "very severe; have knot in my back; from tight muscle; take RX and exercise for it"  . Depression 01/06/2011  . Exertional dyspnea    "sometimes"  . Genital HSV   . GERD (gastroesophageal reflux disease) 02/17/2011  . Migraines 11/08/11   "@ least twice/month"  . 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  . Mood swings ([place]) 11/08/11   "I go back and forth; real bad"  . Sickle cell anemia ([name])   . Sickle cell anemia with crisis ([place])   . Trichotillomania    h/o    Patient Active Problem List   Diagnosis  Date Noted  . Sickle cell crisis ([place]) 11/09/2015  . Sepsis ([place]) 11/09/2015  . Thrombocytosis ([place]) 11/09/2015  . Vaginal bleeding 11/09/2015  . Hyperbilirubinemia 11/09/2015  . Pyrexia   . Vaginal delivery 10/31/2015  . Hb-SS disease with crisis ([place])   . Sickle cell pain crisis ([place]) 10/26/2015  . Positive GBS test 10/18/2015  . Narcotic dependence ([place]) 10/06/2015  . 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 High risk for intrapartum complications, antepartum 08/12/2015  . Chronic pain 08/04/2015  . Cocaine use 07/24/2015  . Maternal sickle cell anemia affecting pregnancy, antepartum ([place]) 07/14/2015  . Anemia of chronic disease   . Migraines 11/08/2011  . GERD (gastroesophageal reflux disease) 02/17/2011  . Depression 01/06/2011  . Sickle cell disease ([place]) 01/08/2009  . 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 CHOLECYSTECTOMY  05/2010  . DILATION AND CURETTAGE OF UTERUS  02/20/11   S/P miscarriage    OB History    Gravida Para Term Preterm AB Living   3 2 2   1 2    SAB TAB Ectopic Multiple Live Births   1     0 2      Obstetric Comments   Miscarried in October 2012  at about 7 weeks       Home Medications    Prior to Admission medications   Medication Sig Start Date End Date Taking? Authorizing Provider  aspirin-acetaminophen-caffeine (EXCEDRIN MIGRAINE) [phone] MG tablet Take 1 tablet by mouth every 6 (six) hours as needed for headache.   Yes Historical Provider, MD  calcium carbonate (TUMS - DOSED IN MG ELEMENTAL CALCIUM) 500 MG chewable tablet Chew 2 tablets by mouth 2 (two) times daily as needed for indigestion or heartburn.   Yes Historical Provider, MD  ibuprofen (ADVIL,MOTRIN) 600 MG tablet Take 1 tablet (600 mg total) by mouth every 6 (six) hours. 11/02/15  Yes Caren Macadam, MD  methadone (DOLOPHINE) 5 MG  tablet Take 1 tablet (5 mg total) by mouth daily. 11/18/15  Yes Olugbemiga Essie Christine, MD  oxyCODONE (ROXICODONE) 15 MG immediate release tablet Take 1 tablet (15 mg total) by mouth every 4 (four) hours as needed for pain. 12/02/15  Yes Tresa Garter, MD  valACYclovir (VALTREX) 500 MG tablet Take 2 tablets by mouth daily. 12/01/15  Yes Historical Provider, MD    Family History Family History  Problem Relation Age of Onset  . Sickle cell trait Mother   . Sickle cell trait Father   . Diabetes Maternal Grandmother   . Diabetes Paternal Grandmother   . Hypertension Paternal Grandmother   . Diabetes Maternal Grandfather     Social History Social History  Substance Use Topics  . Smoking status: Former Smoker    Packs/day: 0.25    Years: 1.00    Types: Cigarettes    Quit date: 03/25/2013  . Smokeless tobacco: Never Used  . Alcohol use No     Comment: pt states she quit marijuan in May 2013. Rare ETOH, + cigarettes.  She is enrolled in school     Allergies   Carrot [daucus carota]; Carrot oil; and Latex   Review of Systems Review of Systems  All other systems reviewed and are negative.    Physical Exam Updated Vital Signs BP 127/66   Pulse 85   Temp 99.6 F (37.6 C) (Oral)   Resp 15   Ht 5\' 1"  (1.549 m)   Wt 165 lb (74.8 kg)   LMP 10/31/2015 Comment: 2 weeks post delivery  SpO2 95%   Breastfeeding? No   BMI 31.18 kg/m   Physical Exam  Constitutional: She is oriented to person, place, and time. She appears well-developed and well-nourished. No distress.  HENT:  Head: Normocephalic and atraumatic.  Eyes: Conjunctivae are normal.  Neck: Neck supple.  Cardiovascular: Normal rate and regular rhythm.   No murmur heard. Pulmonary/Chest: Effort normal and breath sounds normal. No respiratory distress.  Abdominal: Soft. She exhibits no distension. There is no tenderness.  Genitourinary:  Genitourinary Comments: Normal external female genitalia. Scant amount of  old-appearing blood in the vagina, which is emanating from the cervical os. There is a mild prolapse of tissue anteriorly in the vagina. It reduces easily. This likely explains the "ball" that she feels in her vagina.  Musculoskeletal: She exhibits no edema.  Diffuse lumbar tenderness, mild to palpation. She is able to sit up, but this causes low back pain. Somewhat decreased range of motion right hip secondary to pain.  Neurological: She is alert and oriented to person, place, and time. No cranial nerve deficit. Coordination normal.  Skin: Skin is warm and dry.  Psychiatric: She has a normal mood and affect.  Nursing note and vitals reviewed.  ED Treatments / Results  Labs (all labs ordered are listed, but only abnormal results are displayed) Labs Reviewed  WET PREP, GENITAL - Abnormal; Notable for the following:       Result Value   WBC, Wet Prep HPF POC FEW (*)    All other components within normal limits  COMPREHENSIVE METABOLIC PANEL - Abnormal; Notable for the following:    Potassium 3.3 (*)    BUN 5 (*)    Calcium 8.7 (*)    Total Bilirubin 2.2 (*)    All other components within normal limits  CBC WITH DIFFERENTIAL/PLATELET - Abnormal; Notable for the following:    WBC 11.7 (*)    RBC 2.54 (*)    Hemoglobin 7.3 (*)    HCT 21.3 (*)    RDW 19.2 (*)    Monocytes Absolute 1.4 (*)    All other components within normal limits  GC/CHLAMYDIA PROBE AMP ([place]) NOT AT [facility]    EKG  EKG Interpretation None       Radiology Dg Chest 2 View  Result Date: 12/08/2015 CLINICAL DATA:  Sickle cell crisis.  Fever for 2 days. EXAM: CHEST  2 VIEW COMPARISON:  Chest x-ray 11/08/2015. FINDINGS: The heart size and mediastinal contours are within normal limits. Both lungs are clear. 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 IMPRESSION: No active cardiopulmonary disease. Electronically Signed   By: Misty Stanley M.D.   On: 12/08/2015  21:50   Procedures Procedures (including critical care time)  Medications Ordered in ED Medications  0.45 % sodium chloride infusion ( Intravenous New Bag/Given 12/08/15 2302)  HYDROmorphone (DILAUDID) injection 2.3 mg (not administered)    Or  HYDROmorphone (DILAUDID) injection 2.3 mg (not administered)  diphenhydrAMINE (BENADRYL) injection 12.5 mg (not administered)  ketorolac (TORADOL) 30 MG/ML injection 30 mg (30 mg Intravenous Given 12/08/15 2300)  HYDROmorphone (DILAUDID) injection 1.9 mg (1.9 mg Intravenous Given 12/08/15 2300)    Or  HYDROmorphone (DILAUDID) injection 1.9 mg ( Subcutaneous See Alternative 12/08/15 2300)  ondansetron (ZOFRAN) injection 4 mg (4 mg Intravenous Given 12/08/15 2300)  diphenhydrAMINE (BENADRYL) injection 12.5 mg (12.5 mg Intravenous Given 12/08/15 2300)     Initial Impression / Assessment and Plan / ED Course  I have reviewed the triage vital signs and the nursing notes.  Pertinent labs & imaging results that were available during my care of the patient were reviewed by me and considered in my medical decision making (see chart for details).  Clinical Course    Medications  0.45 % sodium chloride infusion ( Intravenous New Bag/Given 12/08/15 2302)  HYDROmorphone (DILAUDID) injection 2.3 mg (not administered)    Or  HYDROmorphone (DILAUDID) injection 2.3 mg (not administered)  diphenhydrAMINE (BENADRYL) injection 12.5 mg (not administered)  ketorolac (TORADOL) 30 MG/ML injection 30 mg (30 mg Intravenous Given 12/08/15 2300)  HYDROmorphone (DILAUDID) injection 1.9 mg (1.9 mg Intravenous Given 12/08/15 2300)    Or  HYDROmorphone (DILAUDID) injection 1.9 mg ( Subcutaneous See Alternative 12/08/15 2300)  ondansetron (ZOFRAN) injection 4 mg (4 mg Intravenous Given 12/08/15 2300)  diphenhydrAMINE (BENADRYL) injection 12.5 mg (12.5 mg Intravenous Given 12/08/15 2300)    Patient Vitals for the past 24 hrs:  BP Temp Temp src Pulse Resp SpO2 Height Weight   12/08/15 2303 127/66 - - 85 15 95 % - -  12/08/15 2249 107/73 - - 85 16 96 % - -  12/08/15 2136 107/73 - - - 19 - - -  12/08/15 2035  120/84 99.6 F (37.6 C) Oral 110 24 - 5\' 1"  (1.549 m) 165 lb (74.8 kg)    11:56 PM Reevaluation with update and discussion. After initial assessment and treatment, an updated evaluation reveals She is ready to go home after her second dose of medications for pain is given. She is hungry, eating cake now and wants a sandwich. Lavanya Roa L    Final Clinical Impressions(s) / ED Diagnoses   Final diagnoses:  Sickle cell anemia with pain ([name])  Stress   Sickle cell anemia with nonspecific pain, possibly stress-related. Hemoglobin is stable. No indicators for further treatment in hospital at this time. Doubt serous bacterial infection. Metabolic instability or impending vascular collapse.  Nursing Notes Reviewed/ Care Coordinated Applicable Imaging Reviewed Interpretation of Laboratory Data incorporated into ED treatment  The patient appears reasonably screened and/or stabilized for discharge and I doubt any other medical condition or other [facility] requiring further screening, evaluation, or treatment in the ED at this time prior to discharge.  Plan: Home Medications- continue; Home Treatments- rest, stress management; return here if the recommended treatment, does not improve the symptoms; Recommended follow up- PCP prn   New Prescriptions New Prescriptions   No medications on file     Daleen Bo, MD 12/09/15 0001

## 2015-12-08 NOTE — ED Notes (Signed)
This RN attempted x1, another RN attempted x1, will use assistance of IV ultrasound when pt returns from xray.

## 2015-12-08 NOTE — ED Triage Notes (Signed)
PT C/O RIGHT HIP/SIDE PAIN SINCE Sunday DUE TO SICKLE CELL DISEASE. PT ALSO C/O VAGINAL BLEEDING SINCE 10/31/15, WHICH WAS THE DAY SHE GAVE BIRTH. PT STATES SHE IS ON THE DEPO, BUT MISSED HER DOSE ON Monday. PT STATES SHE FEELS "A BALL IS HANGING OUT OF HER VAGINA."

## 2015-12-09 NOTE — Discharge Instructions (Signed)
Work on stress management  Be careful with your diet.  See your doctor as soon as possible for further treatment as needed

## 2015-12-10 LAB — GC/CHLAMYDIA PROBE AMP (~~LOC~~) NOT AT ARMC
CHLAMYDIA, DNA PROBE: NEGATIVE
NEISSERIA GONORRHEA: NEGATIVE

## 2015-12-11 ENCOUNTER — Other Ambulatory Visit: Payer: Self-pay | Admitting: Internal Medicine

## 2015-12-11 ENCOUNTER — Telehealth: Payer: Self-pay

## 2015-12-11 NOTE — Telephone Encounter (Signed)
Refill for oxycodone. Please advise. Thanks!

## 2015-12-14 ENCOUNTER — Emergency Department (HOSPITAL_COMMUNITY)
Admission: EM | Admit: 2015-12-14 | Discharge: 2015-12-15 | Disposition: A | Discharge status: Home / Self Care | Payer: Medicare Other | Attending: Emergency Medicine | Admitting: Emergency Medicine

## 2015-12-14 ENCOUNTER — Ambulatory Visit: Payer: Medicaid Other | Admitting: Family Medicine

## 2015-12-14 ENCOUNTER — Encounter (HOSPITAL_COMMUNITY): Payer: Self-pay | Admitting: Emergency Medicine

## 2015-12-14 DIAGNOSIS — D5700 Hb-SS disease with crisis, unspecified: Secondary | ICD-10-CM | POA: Diagnosis not present

## 2015-12-14 DIAGNOSIS — Z7982 Long term (current) use of aspirin: Secondary | ICD-10-CM | POA: Insufficient documentation

## 2015-12-14 DIAGNOSIS — Z79899 Other long term (current) drug therapy: Secondary | ICD-10-CM | POA: Diagnosis not present

## 2015-12-14 DIAGNOSIS — D571 Sickle-cell disease without crisis: Secondary | ICD-10-CM | POA: Diagnosis not present

## 2015-12-14 DIAGNOSIS — Z91040 Latex allergy status: Secondary | ICD-10-CM | POA: Diagnosis not present

## 2015-12-14 DIAGNOSIS — Z791 Long term (current) use of non-steroidal anti-inflammatories (NSAID): Secondary | ICD-10-CM | POA: Insufficient documentation

## 2015-12-14 DIAGNOSIS — Z87891 Personal history of nicotine dependence: Secondary | ICD-10-CM | POA: Diagnosis not present

## 2015-12-14 NOTE — ED Triage Notes (Signed)
Pt states that she started having L leg pain tonight related to her sickle cell crisis. States she took 1 15mg  oxycodone. Has pcp appt tomorrow morning. Alert and oriented.

## 2015-12-15 ENCOUNTER — Ambulatory Visit (INDEPENDENT_AMBULATORY_CARE_PROVIDER_SITE_OTHER): Payer: Medicare Other | Admitting: Internal Medicine

## 2015-12-15 ENCOUNTER — Other Ambulatory Visit: Payer: Self-pay | Admitting: Internal Medicine

## 2015-12-15 VITALS — BP 108/64 | HR 74 | Temp 98.8°F | Resp 18 | Ht 61.0 in | Wt 167.0 lb

## 2015-12-15 DIAGNOSIS — G8929 Other chronic pain: Secondary | ICD-10-CM

## 2015-12-15 DIAGNOSIS — D571 Sickle-cell disease without crisis: Secondary | ICD-10-CM | POA: Diagnosis not present

## 2015-12-15 DIAGNOSIS — D5700 Hb-SS disease with crisis, unspecified: Secondary | ICD-10-CM | POA: Diagnosis not present

## 2015-12-15 LAB — COMPREHENSIVE METABOLIC PANEL
ALT: 14 U/L (ref 14–54)
ANION GAP: 7 (ref 5–15)
AST: 19 U/L (ref 15–41)
Albumin: 4.3 g/dL (ref 3.5–5.0)
Alkaline Phosphatase: 50 U/L (ref 38–126)
BUN: 6 mg/dL (ref 6–20)
CALCIUM: 8.8 mg/dL — AB (ref 8.9–10.3)
CHLORIDE: 108 mmol/L (ref 101–111)
CO2: 25 mmol/L (ref 22–32)
CREATININE: 0.64 mg/dL (ref 0.44–1.00)
Glucose, Bld: 107 mg/dL — ABNORMAL HIGH (ref 65–99)
Potassium: 3.5 mmol/L (ref 3.5–5.1)
SODIUM: 140 mmol/L (ref 135–145)
Total Bilirubin: 1.9 mg/dL — ABNORMAL HIGH (ref 0.3–1.2)
Total Protein: 7.2 g/dL (ref 6.5–8.1)

## 2015-12-15 LAB — CBC WITH DIFFERENTIAL/PLATELET
BASOS ABS: 0.1 10*3/uL (ref 0.0–0.1)
BASOS PCT: 1 %
EOS PCT: 4 %
Eosinophils Absolute: 0.3 10*3/uL (ref 0.0–0.7)
HCT: 30.1 % — ABNORMAL LOW (ref 36.0–46.0)
Hemoglobin: 10 g/dL — ABNORMAL LOW (ref 12.0–15.0)
LYMPHS ABS: 2.1 10*3/uL (ref 0.7–4.0)
LYMPHS PCT: 32 %
MCH: 28.2 pg (ref 26.0–34.0)
MCHC: 33.2 g/dL (ref 30.0–36.0)
MCV: 85 fL (ref 78.0–100.0)
MONO ABS: 0.3 10*3/uL (ref 0.1–1.0)
Monocytes Relative: 5 %
NEUTROS PCT: 58 %
Neutro Abs: 3.8 10*3/uL (ref 1.7–7.7)
PLATELETS: ADEQUATE 10*3/uL (ref 150–400)
RBC: 3.54 MIL/uL — AB (ref 3.87–5.11)
RDW: 19.1 % — AB (ref 11.5–15.5)
WBC: 6.6 10*3/uL (ref 4.0–10.5)
nRBC: 6 /100 WBC — ABNORMAL HIGH

## 2015-12-15 LAB — RETICULOCYTES
RBC.: 3.54 MIL/uL — AB (ref 3.87–5.11)
RETIC COUNT ABSOLUTE: 569.9 10*3/uL — AB (ref 19.0–186.0)
Retic Ct Pct: 16.1 % — ABNORMAL HIGH (ref 0.4–3.1)

## 2015-12-15 MED ORDER — ONDANSETRON HCL 4 MG/2ML IJ SOLN
4.0000 mg | Freq: Once | INTRAMUSCULAR | Status: AC
  Administered 2015-12-15: 4 mg via INTRAVENOUS
  Filled 2015-12-15: qty 2

## 2015-12-15 MED ORDER — HYDROMORPHONE HCL 2 MG/ML IJ SOLN
2.0000 mg | Freq: Once | INTRAMUSCULAR | Status: AC
  Administered 2015-12-15: 2 mg via INTRAVENOUS
  Filled 2015-12-15: qty 1

## 2015-12-15 MED ORDER — HYDROXYUREA 500 MG PO CAPS: 500.0000 mg | ORAL_CAPSULE | Freq: Every day | ORAL | 3 refills | Status: DC

## 2015-12-15 MED ORDER — DIPHENHYDRAMINE HCL 25 MG PO CAPS
50.0000 mg | ORAL_CAPSULE | Freq: Once | ORAL | Status: AC
  Administered 2015-12-15: 50 mg via ORAL
  Filled 2015-12-15: qty 2

## 2015-12-15 MED ORDER — SODIUM CHLORIDE 0.9 % IV BOLUS (SEPSIS)
2000.0000 mL | Freq: Once | INTRAVENOUS | Status: AC
  Administered 2015-12-15: 2000 mL via INTRAVENOUS

## 2015-12-15 MED ORDER — HYDROMORPHONE HCL 1 MG/ML IJ SOLN: 1.0000 mg | Freq: Once | INTRAMUSCULAR | Status: DC

## 2015-12-15 MED ORDER — OXYCODONE HCL 15 MG PO TABS: 15.0000 mg | ORAL_TABLET | ORAL | 0 refills | Status: DC | PRN

## 2015-12-15 MED FILL — oxyCODONE HCL 15 MG TABS: 15 | 15 days supply | Qty: 90 | Fill #0

## 2015-12-15 NOTE — ED Notes (Signed)
Pt ambulatory and independent at discharge.  Verbalized understanding of discharge instructions 

## 2015-12-15 NOTE — ED Provider Notes (Signed)
[facility] DEPT Provider Note   CSN: [id number] Arrival date & time: 12/14/15  2301  First Provider Contact:  First MD Initiated Contact with Patient 12/15/15 0300        History   Chief Complaint Chief Complaint  Patient presents with  . Sickle Cell Pain Crisis    HPI [name] is a 25 y.o. female.  Patient with a history of sickle cell anemia presents with typical pain in lower extremities. No fever, vomiting, chest pain, cough, SOB. She states pain started last night after running errands with her kids all day and has worsened since arrival to the hospital. She denies fever but reports chills, "can't get warm."   The history is provided by the patient. No language interpreter was used.    Past Medical History:  Diagnosis Date  . Blood transfusion    "lots"  . Blood transfusion without reported diagnosis   . Chronic back pain    "very severe; have knot in my back; from tight muscle; take RX and exercise for it"  . Depression 01/06/2011  . Exertional dyspnea    "sometimes"  . Genital HSV   . GERD (gastroesophageal reflux disease) 02/17/2011  . Migraines 11/08/11   "@ least twice/month"  . 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  . Mood swings ([place]) 11/08/11   "I go back and forth; real bad"  . Sickle cell anemia ([name])   . Sickle cell anemia with crisis ([place])   . Trichotillomania    h/o    Patient Active Problem List   Diagnosis Date Noted  . Sickle cell crisis ([place]) 11/09/2015  . Sepsis ([place]) 11/09/2015  . Thrombocytosis ([place]) 11/09/2015  . Vaginal bleeding 11/09/2015  . Hyperbilirubinemia 11/09/2015  . Pyrexia   . Vaginal delivery 10/31/2015  . Hb-SS disease with crisis ([place])   . Sickle cell pain crisis ([place]) 10/26/2015  . Positive GBS test 10/18/2015  . Narcotic dependence ([place]) 10/06/2015  . 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  High risk for intrapartum complications, antepartum 08/12/2015  . Chronic pain 08/04/2015  . Cocaine use 07/24/2015  . Maternal sickle cell anemia affecting pregnancy, antepartum ([place]) 07/14/2015  . Anemia of chronic disease   . Migraines 11/08/2011  . GERD (gastroesophageal reflux disease) 02/17/2011  . Depression 01/06/2011  . Sickle cell disease ([place]) 01/08/2009  . 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 CHOLECYSTECTOMY  05/2010  . DILATION AND CURETTAGE OF UTERUS  02/20/11   S/P miscarriage    OB History    Gravida Para Term Preterm AB Living   3 2 2   1 2    SAB TAB Ectopic Multiple Live Births   1     0 2      Obstetric Comments   Miscarried in October 2012 at about 7 weeks       Home Medications    Prior to Admission medications   Medication Sig Start Date End Date Taking? Authorizing Provider  aspirin-acetaminophen-caffeine (EXCEDRIN MIGRAINE) [phone] MG tablet Take 1 tablet by mouth every 6 (six) hours as needed for headache.   Yes Historical Provider, MD  calcium carbonate (TUMS - DOSED IN MG ELEMENTAL CALCIUM) 500 MG chewable tablet Chew 2 tablets by mouth 2 (two) times daily as needed for indigestion or heartburn.   Yes Historical Provider, MD  ibuprofen (ADVIL,MOTRIN) 600 MG tablet Take 1  tablet (600 mg total) by mouth every 6 (six) hours. 11/02/15  Yes Caren Macadam, MD  methadone (DOLOPHINE) 5 MG tablet Take 1 tablet (5 mg total) by mouth daily. 11/18/15  Yes Olugbemiga Essie Christine, MD  oxyCODONE (ROXICODONE) 15 MG immediate release tablet Take 1 tablet (15 mg total) by mouth every 4 (four) hours as needed for pain. 12/02/15  Yes Tresa Garter, MD  valACYclovir (VALTREX) 500 MG tablet Take 1,000 mg by mouth daily as needed (for flares.).  12/01/15  Yes Historical Provider, MD    Family History Family History  Problem Relation Age of Onset  . Sickle cell trait Mother   . Sickle cell trait Father   .  Diabetes Maternal Grandmother   . Diabetes Paternal Grandmother   . Hypertension Paternal Grandmother   . Diabetes Maternal Grandfather     Social History Social History  Substance Use Topics  . Smoking status: Former Smoker    Packs/day: 0.25    Years: 1.00    Types: Cigarettes    Quit date: 03/25/2013  . Smokeless tobacco: Never Used  . Alcohol use No     Comment: pt states she quit marijuan in May 2013. Rare ETOH, + cigarettes.  She is enrolled in school     Allergies   Carrot [daucus carota]; Carrot oil; and Latex   Review of Systems Review of Systems  Constitutional: Negative for chills and fever.  Respiratory: Negative.  Negative for cough and shortness of breath.   Cardiovascular: Negative.  Negative for chest pain.  Gastrointestinal: Negative.  Negative for abdominal pain, nausea and vomiting.  Genitourinary: Negative.   Musculoskeletal: Positive for arthralgias and myalgias.  Skin: Negative.  Negative for color change.  Neurological: Negative.  Negative for syncope and weakness.     Physical Exam Updated Vital Signs BP 107/64 (BP Location: Right Arm)   Pulse 67   Temp 99.5 F (37.5 C) (Oral)   Resp 22   LMP 12/11/2015 (Approximate)   SpO2 100%   Physical Exam  Constitutional: She appears well-developed and well-nourished.  HENT:  Head: Normocephalic.  Neck: Normal range of motion. Neck supple.  Cardiovascular: Normal rate and regular rhythm.   Pulmonary/Chest: Effort normal and breath sounds normal.  Abdominal: Soft. Bowel sounds are normal. There is no tenderness. There is no rebound and no guarding.  Musculoskeletal: Normal range of motion. She exhibits no edema.  Neurological: She is alert. No cranial nerve deficit.  Skin: Skin is warm and dry. No rash noted. No erythema.  Psychiatric: She has a normal mood and affect.     ED Treatments / Results  Labs (all labs ordered are listed, but only abnormal results are displayed) Labs Reviewed    COMPREHENSIVE METABOLIC PANEL  CBC WITH DIFFERENTIAL/PLATELET  RETICULOCYTES  PREGNANCY, URINE  URINALYSIS, ROUTINE W REFLEX MICROSCOPIC (NOT AT [facility])    EKG  EKG Interpretation None       Radiology No results found.  Procedures Procedures (including critical care time)  Medications Ordered in ED Medications  ondansetron (ZOFRAN) injection 4 mg (not administered)  sodium chloride 0.9 % bolus 2,000 mL (not administered)  HYDROmorphone (DILAUDID) injection 2 mg (not administered)     Initial Impression / Assessment and Plan / ED Course  I have reviewed the triage vital signs and the nursing notes.  Pertinent labs & imaging results that were available during my care of the patient were reviewed by me and considered in my medical decision making (see chart  for details).  Clinical Course    Patient with history of sickle cell with pain typical of sickle cell crisis. No fever, chest pain. She reports she has an appointment with her doctor later this morning and reports she does not want to be admitted.  Difficult IV start with delay in pain management. IV Dilaudid, benadryl, Zofran provided. She feels minimal relief and is requesting second dose Dilaudid, which is provided.  Reassessment. She reports feeling better. Discussed discharge home. Will give one further dose of pain medication and discharge. Patient in agreement and reports being ready for discharge.   Final Clinical Impressions(s) / ED Diagnoses   Final diagnoses:  None   1. Sickle cell anemia with pain  New Prescriptions New Prescriptions   No medications on file     Charlann Lange, PA-C 12/15/15 0629    Charlann Lange, PA-C [id number]    Delora Fuel, MD [id number]

## 2015-12-15 NOTE — Patient Instructions (Signed)
Hydroxyurea capsules What is this medicine? HYDROXYUREA (hye drox ee yoor EE a) is a chemotherapy drug. This medicine is used to treat certain types of leukemias and head and neck cancer. It is also used to control the painful crises of sickle cell anemia. This medicine may be used for other purposes; ask your health care provider or pharmacist if you have questions. What should I tell my health care provider before I take this medicine? They need to know if you have any of these conditions: -HIV or AIDS -kidney disease or on hemodialysis -liver disease -low blood counts, like low white cell, platelet, or red cell counts -prior or current interferon therapy -recent or ongoing radiation therapy -an unusual or allergic reaction to hydroxyurea, other chemotherapy, other medicines, foods, dyes, or preservatives -pregnant or trying to get pregnant -breast-feeding How should I use this medicine? Take this medicine by mouth with a glass of water. Follow the directions on the prescription label. Take your medicine at regular intervals. Do not take it more often than directed. Do not stop taking except on your doctor's advice. People who are not taking this medicine should not be exposed to it. Wash your hands before and after handling your bottle or medicine. Caregivers should wear disposable gloves if they must touch the bottle or medicine. Clean up any medicine powder that spills with a damp disposable towel and throw the towel away in a closed container, such as a plastic bag. Talk to your pediatrician regarding the use of this medicine in children. Special care may be needed. Overdosage: If you think you have taken too much of this medicine contact a poison control center or emergency room at once. NOTE: This medicine is only for you. Do not share this medicine with others. What if I miss a dose? If you miss a dose, take it as soon as you can. If it is almost time for your next dose, take only that  dose. Do not take double or extra doses. What may interact with this medicine? This medicine may also interact with the following medications: -didanosine -stavudine -live virus vaccines This list may not describe all possible interactions. Give your health care provider a list of all the medicines, herbs, non-prescription drugs, or dietary supplements you use. Also tell them if you smoke, drink alcohol, or use illegal drugs. Some items may interact with your medicine. What should I watch for while using this medicine? This drug may make you feel generally unwell. This is not uncommon, as chemotherapy can affect healthy cells as well as cancer cells. Report any side effects. Continue your course of treatment even though you feel ill unless your doctor tells you to stop. You will receive regular blood tests during your treatment. Call your doctor or health care professional for advice if you get a fever, chills or sore throat, or other symptoms of a cold or flu. Do not treat yourself. This drug decreases your body's ability to fight infections. Try to avoid being around people who are sick. This medicine may increase your risk to bruise or bleed. Call your doctor or health care professional if you notice any unusual bleeding. Talk to your doctor about your risk of cancer. You may be more at risk for certain types of cancers if you take this medicine. You may need blood work done while you are taking this medicine. Do not become pregnant while taking this medicine or for at least 6 months after stopping it. Women should inform their   doctor if they wish to become pregnant or think they might be pregnant. Men should not father a child while taking this medicine and for at least a year after stopping it. There is a potential for serious side effects to an unborn child. Talk to your health care professional or pharmacist for more information. Do not breast-feed an infant while taking this medicine. This may  interfere with the ability to have or father a child. You should talk with your doctor or health care professional if you are concerned about your fertility. What side effects may I notice from receiving this medicine? Side effects that you should report to your doctor or health care professional as soon as possible: -allergic reactions like skin rash, itching or hives, swelling of the face, lips, or tongue -low blood counts - this medicine may decrease the number of white blood cells, red blood cells and platelets. You may be at increased risk for infections and bleeding. -signs of infection - fever or chills, cough, sore throat, pain or difficulty passing urine -signs of decreased platelets or bleeding - bruising, pinpoint red spots on the skin, black, tarry stools, blood in the urine -signs of decreased red blood cells - unusually weak or tired, fainting spells, lightheadedness -breathing problems -burning, redness or pain at the site of any radiation therapy -changes in skin color -confusion -mouth sores -pain, tingling, numbness in the hands or feet -seizures -skin ulcers -trouble passing urine or change in the amount of urine -vomiting Side effects that usually do not require medical attention (report to your doctor or health care professional if they continue or are bothersome): -headache -loss of appetite -red color to the face This list may not describe all possible side effects. Call your doctor for medical advice about side effects. You may report side effects to FDA at 1-800-FDA-1088. Where should I keep my medicine? Keep out of the reach of children. See product for storage instructions. Each product may have different instructions. Keep tightly closed. Throw away any unused medicine after the expiration date. NOTE: This sheet is a summary. It may not cover all possible information. If you have questions about this medicine, talk to your doctor, pharmacist, or health care  provider.    2016, Elsevier/Gold Standard. (2014-08-15 16:51:41) Sickle Cell Anemia, Adult Sickle cell anemia is a condition in which red blood cells have an abnormal "sickle" shape. This abnormal shape shortens the cells' life span, which results in a lower than normal concentration of red blood cells in the blood. The sickle shape also causes the cells to clump together and block free blood flow through the blood vessels. As a result, the tissues and organs of the body do not receive enough oxygen. Sickle cell anemia causes organ damage and pain and increases the risk of infection. CAUSES  Sickle cell anemia is a genetic disorder. Those who receive two copies of the gene have the condition, and those who receive one copy have the trait. RISK FACTORS The sickle cell gene is most common in people whose families originated in [place]. Other areas of the globe where sickle cell trait occurs include the [place], [place] and [place], and the [place].  SIGNS AND SYMPTOMS  Pain, especially in the extremities, back, chest, or abdomen (common). The pain may start suddenly or may develop following an illness, especially if there is dehydration. Pain can also occur due to overexertion or exposure to extreme temperature changes.  Frequent severe bacterial infections, especially certain  types of pneumonia and meningitis.  Pain and swelling in the hands and feet.  Decreased activity.   Loss of appetite.   Change in behavior.  Headaches.  Seizures.  Shortness of breath or difficulty breathing.  Vision changes.  Skin ulcers. Those with the trait may not have symptoms or they may have mild symptoms.  DIAGNOSIS  Sickle cell anemia is diagnosed with blood tests that demonstrate the genetic trait. It is often diagnosed during the newborn period, due to mandatory testing nationwide. A variety of blood tests, X-rays, CT scans, MRI scans, ultrasounds, and lung function  tests may also be done to monitor the condition. TREATMENT  Sickle cell anemia may be treated with:  Medicines. You may be given pain medicines, antibiotic medicines (to treat and prevent infections) or medicines to increase the production of certain types of hemoglobin.  Fluids.  Oxygen.  Blood transfusions. HOME CARE INSTRUCTIONS   Drink enough fluid to keep your urine clear or pale yellow. Increase your fluid intake in hot weather and during exercise.  Do not smoke. Smoking lowers oxygen levels in the blood.   Only take over-the-counter or prescription medicines for pain, fever, or discomfort as directed by your health care provider.  Take antibiotics as directed by your health care provider. Make sure you finish them it even if you start to feel better.   Take supplements as directed by your health care provider.   Consider wearing a medical alert bracelet. This tells anyone caring for you in an emergency of your condition.   When traveling, keep your medical information, health care provider's names, and the medicines you take with you at all times.   If you develop a fever, do not take medicines to reduce the fever right away. This could cover up a problem that is developing. Notify your health care provider.  Keep all follow-up appointments with your health care provider. Sickle cell anemia requires regular medical care. SEEK MEDICAL CARE IF: You have a fever. SEEK IMMEDIATE MEDICAL CARE IF:   You feel dizzy or faint.   You have new abdominal pain, especially on the left side near the stomach area.   You develop a persistent, often uncomfortable and painful penile erection (priapism). If this is not treated immediately it will lead to impotence.   You have numbness your arms or legs or you have a hard time moving them.   You have a hard time with speech.   You have a fever or persistent symptoms for more than 2-3 days.   You have a fever and your  symptoms suddenly get worse.   You have signs or symptoms of infection. These include:   Chills.   Abnormal tiredness (lethargy).   Irritability.   Poor eating.   Vomiting.   You develop pain that is not helped with medicine.   You develop shortness of breath.  You have pain in your chest.   You are coughing up pus-like or bloody sputum.   You develop a stiff neck.  Your feet or hands swell or have pain.  Your abdomen appears bloated.  You develop joint pain. MAKE SURE YOU:  Understand these instructions.   This information is not intended to replace advice given to you by your health care provider. Make sure you discuss any questions you have with your health care provider.   Document Released: 08/10/2005 Document Revised: 05/23/2014 Document Reviewed: 12/12/2012 Elsevier Interactive Patient Education [employer].

## 2015-12-15 NOTE — Progress Notes (Signed)
Patient is here for FU  Patient complains of leg pain being present.  Patient also complains of not being able to focus on school assignments.  Patient has not taken medication today because she was released from the hospital this morning.

## 2015-12-15 NOTE — Progress Notes (Signed)
[name], is a 25 y.o. female  [id number]  [id number]  DOB - 03/20/1991  Chief Complaint  Patient presents with  . Follow-up        Subjective:   [name] is a 25 y.o. female with sickle cell disease, approx 7 weeks postpartum here today for a follow up ED visit. Patient was seen in the ED earlier this morning for pain crisis from her sickle cell. She is still drowsy from IV dilaudid given before discharge from the ED. She has no new complaint today but needs refill of her pain medication. We discussed restarting her hydroxyurea now that she is done with pregnancy and breastfeeding. She does not intend to have more babies and she has an appointment with the gynecologist for contraception this week. She denies any fever no urinary symptom, no yellowness of the eyes. Her pain is better controlled at this time. She denies being depressed, no suicidal ideation or thought. Patient has No headache, No chest pain, No abdominal pain - No Nausea, No new weakness tingling or numbness, No Cough - SOB.  Problem  Hb-SS disease without crisis ([place])    ALLERGIES: Allergies  Allergen Reactions  . Carrot [Daucus Carota] Hives, Swelling and Rash    Dietary:  Please do not send any form of carrot to patient  . Carrot Oil Hives and Swelling  . Latex Rash    *powdered Latex*     PAST MEDICAL HISTORY: Past Medical History:  Diagnosis Date  . Blood transfusion    "lots"  . Blood transfusion without reported diagnosis   . Chronic back pain    "very severe; have knot in my back; from tight muscle; take RX and exercise for it"  . Depression 01/06/2011  . Exertional dyspnea    "sometimes"  . Genital HSV   . GERD (gastroesophageal reflux disease) 02/17/2011  . Migraines 11/08/11   "@ least twice/month"  . 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  . Mood swings ([place]) 11/08/11   "I go back and forth; real bad"  . Sickle cell anemia ([name])   . Sickle cell anemia with  crisis ([place])   . Trichotillomania    h/o    MEDICATIONS AT HOME: Prior to Admission medications   Medication Sig Start Date End Date Taking? Authorizing Provider  aspirin-acetaminophen-caffeine (EXCEDRIN MIGRAINE) [phone] MG tablet Take 1 tablet by mouth every 6 (six) hours as needed for headache.   Yes Historical Provider, MD  calcium carbonate (TUMS - DOSED IN MG ELEMENTAL CALCIUM) 500 MG chewable tablet Chew 2 tablets by mouth 2 (two) times daily as needed for indigestion or heartburn.   Yes Historical Provider, MD  ibuprofen (ADVIL,MOTRIN) 600 MG tablet Take 1 tablet (600 mg total) by mouth every 6 (six) hours. 11/02/15  Yes Caren Macadam, MD  methadone (DOLOPHINE) 5 MG tablet Take 1 tablet (5 mg total) by mouth daily. 11/18/15  Yes Pearley Baranek Essie Christine, MD  oxyCODONE (ROXICODONE) 15 MG immediate release tablet Take 1 tablet (15 mg total) by mouth every 4 (four) hours as needed for pain. 12/15/15  Yes Tresa Garter, MD  valACYclovir (VALTREX) 500 MG tablet Take 1,000 mg by mouth daily as needed (for flares.).  12/01/15  Yes Historical Provider, MD  hydroxyurea (HYDREA) 500 MG capsule Take 1 capsule (500 mg total) by mouth daily. May take with food to minimize GI side effects. 12/15/15   Tresa Garter, MD     Objective:   Vitals:  12/15/15 1037  BP: 108/64  Pulse: 74  Resp: 18  Temp: 98.8 F (37.1 C)  TempSrc: Oral  SpO2: 100%  Weight: 167 lb (75.8 kg)  Height: 5\' 1"  (1.549 m)    Exam General appearance : Awake, alert, not in any distress. Speech Clear. Not toxic looking HEENT: Atraumatic and Normocephalic, pupils equally reactive to light and accomodation Neck: Supple, no JVD. No cervical lymphadenopathy.  Chest: Good air entry bilaterally, no added sounds  CVS: S1 S2 regular, no murmurs.  Abdomen: Bowel sounds present, Non tender and not distended with no gaurding, rigidity or rebound. Extremities: B/L Lower Ext shows no edema, both legs are warm to  touch Neurology: Awake alert, and oriented X 3, CN II-XII intact, Non focal Skin: No Rash  Data Review No results found for: HGBA1C   Assessment & Plan   1. Hb-SS disease without crisis ([place])  Will restart patient on Hydrea at a lower dose then titrate back up to pre-pregnancy dose. We discussed the need for good hydration, monitoring of hydration status, avoidance of heat, cold, stress, and infection triggers. We discussed the risks and benefits of Hydrea, including bone marrow suppression, the possibility of GI upset, skin ulcers, hair thinning, and teratogenicity. The patient was reminded of the need to seek medical attention of any symptoms of bleeding, anemia, or infection. Continue folic acid 1 mg daily to prevent aplastic bone marrow crises.  Refill - oxyCODONE (ROXICODONE) 15 MG immediate release tablet; Take 1 tablet (15 mg total) by mouth every 4 (four) hours as needed for pain.  Dispense: 90 tablet; Refill: 0 - hydroxyurea (HYDREA) 500 MG capsule; Take 1 capsule (500 mg total) by mouth daily. May take with food to minimize GI side effects.  Dispense: 30 capsule; Refill: 3  2. Chronic pain  We agreed on Opiate dose and amount of pills  per month. We discussed that pt is to receive Schedule II prescriptions only from our clinic. Pt is also aware that the prescription history is available to [place] online through the [facility] CSRS. Controlled substance agreement reviewed and signed. We reminded Khadeeja that all patients receiving Schedule II narcotics must be seen for follow within one month of prescription being requested. We reviewed the terms of our pain agreement, including the need to keep medicines in a safe locked location away from children or pets, and the need to report excess sedation or constipation, measures to avoid constipation, and policies related to early refills and stolen prescriptions. According to the [place] [facility] program, we have reviewed details related to  analgesia, adverse effects and aberrant behaviors.  Patient have been counseled extensively about nutrition and exercise  Return in about 4 weeks (around 01/12/2016) for Sickle Cell Disease/Pain.  The patient was given clear instructions to go to ER or return to medical center if symptoms don't improve, worsen or new problems develop. The patient verbalized understanding. The patient was told to call to get lab results if they haven't heard anything in the next week.   This note has been created with Surveyor, quantity. Any transcriptional errors are unintentional.    Angelica Chessman, MD, [place], [place], [place], [place] and [facility] [place], [place]   12/15/2015, 11:20 AM

## 2015-12-15 NOTE — ED Notes (Signed)
Pt given sandwich and apple juice  

## 2015-12-15 NOTE — ED Notes (Signed)
IV team at bedside 

## 2015-12-16 ENCOUNTER — Other Ambulatory Visit: Payer: Self-pay | Admitting: Internal Medicine

## 2015-12-16 ENCOUNTER — Telehealth (HOSPITAL_COMMUNITY): Payer: Self-pay | Admitting: Hematology

## 2015-12-16 DIAGNOSIS — D571 Sickle-cell disease without crisis: Secondary | ICD-10-CM

## 2015-12-16 NOTE — Telephone Encounter (Signed)
Called patient and advised that orders had been placed for port-a-cath placement.  I spoke with Interventional radiology and they have orders and will be reaching out to patient.  I explained all this to patient and she is aware.  I advised patient to be on the look out for that department to call her.  Patient verbalizes understanding.

## 2015-12-21 ENCOUNTER — Telehealth: Payer: Self-pay

## 2015-12-21 ENCOUNTER — Other Ambulatory Visit (HOSPITAL_COMMUNITY): Payer: Self-pay | Admitting: Family Medicine

## 2015-12-21 DIAGNOSIS — G8929 Other chronic pain: Secondary | ICD-10-CM

## 2015-12-21 MED ORDER — METHADONE HCL 5 MG PO TABS: 5.0000 mg | ORAL_TABLET | Freq: Every day | ORAL | 0 refills | Status: DC

## 2015-12-21 NOTE — Telephone Encounter (Signed)
Refill Request for methadone. LOV 12/15/2015. Please advise.

## 2015-12-22 ENCOUNTER — Other Ambulatory Visit: Payer: Self-pay | Admitting: Physician Assistant

## 2015-12-22 ENCOUNTER — Other Ambulatory Visit: Payer: Self-pay | Admitting: General Surgery

## 2015-12-23 ENCOUNTER — Ambulatory Visit (HOSPITAL_COMMUNITY)
Admission: RE | Admit: 2015-12-23 | Discharge: 2015-12-23 | Disposition: A | Discharge status: Home / Self Care | Payer: Medicare Other | Source: Ambulatory Visit | Attending: Internal Medicine | Admitting: Internal Medicine

## 2015-12-23 ENCOUNTER — Encounter (HOSPITAL_COMMUNITY): Payer: Self-pay

## 2015-12-23 ENCOUNTER — Other Ambulatory Visit: Payer: Self-pay | Admitting: Internal Medicine

## 2015-12-23 DIAGNOSIS — D571 Sickle-cell disease without crisis: Secondary | ICD-10-CM | POA: Diagnosis not present

## 2015-12-23 DIAGNOSIS — F329 Major depressive disorder, single episode, unspecified: Secondary | ICD-10-CM | POA: Insufficient documentation

## 2015-12-23 DIAGNOSIS — M549 Dorsalgia, unspecified: Secondary | ICD-10-CM | POA: Diagnosis not present

## 2015-12-23 DIAGNOSIS — G43909 Migraine, unspecified, not intractable, without status migrainosus: Secondary | ICD-10-CM | POA: Insufficient documentation

## 2015-12-23 DIAGNOSIS — A6000 Herpesviral infection of urogenital system, unspecified: Secondary | ICD-10-CM | POA: Diagnosis not present

## 2015-12-23 DIAGNOSIS — G8929 Other chronic pain: Secondary | ICD-10-CM | POA: Diagnosis not present

## 2015-12-23 DIAGNOSIS — Z91040 Latex allergy status: Secondary | ICD-10-CM | POA: Insufficient documentation

## 2015-12-23 DIAGNOSIS — Z452 Encounter for adjustment and management of vascular access device: Secondary | ICD-10-CM | POA: Diagnosis not present

## 2015-12-23 DIAGNOSIS — Z832 Family history of diseases of the blood and blood-forming organs and certain disorders involving the immune mechanism: Secondary | ICD-10-CM | POA: Diagnosis not present

## 2015-12-23 DIAGNOSIS — K219 Gastro-esophageal reflux disease without esophagitis: Secondary | ICD-10-CM | POA: Diagnosis not present

## 2015-12-23 DIAGNOSIS — Z833 Family history of diabetes mellitus: Secondary | ICD-10-CM | POA: Diagnosis not present

## 2015-12-23 DIAGNOSIS — Z87891 Personal history of nicotine dependence: Secondary | ICD-10-CM | POA: Diagnosis not present

## 2015-12-23 DIAGNOSIS — I878 Other specified disorders of veins: Secondary | ICD-10-CM | POA: Diagnosis not present

## 2015-12-23 DIAGNOSIS — Z8249 Family history of ischemic heart disease and other diseases of the circulatory system: Secondary | ICD-10-CM | POA: Insufficient documentation

## 2015-12-23 DIAGNOSIS — F633 Trichotillomania: Secondary | ICD-10-CM | POA: Insufficient documentation

## 2015-12-23 HISTORY — PX: IR GENERIC HISTORICAL: IMG1180011

## 2015-12-23 MED ORDER — LIDOCAINE HCL 1 % IJ SOLN
INTRAMUSCULAR | Status: AC
  Filled 2015-12-23: qty 20

## 2015-12-23 MED ORDER — HEPARIN SOD (PORK) LOCK FLUSH 100 UNIT/ML IV SOLN
INTRAVENOUS | Status: AC
  Filled 2015-12-23: qty 5

## 2015-12-23 MED ORDER — CEFAZOLIN SODIUM-DEXTROSE 2-4 GM/100ML-% IV SOLN
2.0000 g | INTRAVENOUS | Status: AC
  Administered 2015-12-23: 2 g via INTRAVENOUS
  Filled 2015-12-23: qty 100

## 2015-12-23 MED ORDER — HEPARIN SOD (PORK) LOCK FLUSH 100 UNIT/ML IV SOLN
INTRAVENOUS | Status: AC | PRN
  Administered 2015-12-23: 500 [IU]

## 2015-12-23 MED ORDER — FENTANYL CITRATE (PF) 100 MCG/2ML IJ SOLN
INTRAMUSCULAR | Status: AC
  Filled 2015-12-23: qty 4

## 2015-12-23 MED ORDER — FENTANYL CITRATE (PF) 100 MCG/2ML IJ SOLN
INTRAMUSCULAR | Status: AC | PRN
  Administered 2015-12-23: 25 ug via INTRAVENOUS
  Administered 2015-12-23: 50 ug via INTRAVENOUS

## 2015-12-23 MED ORDER — MIDAZOLAM HCL 2 MG/2ML IJ SOLN
INTRAMUSCULAR | Status: AC
  Filled 2015-12-23: qty 6

## 2015-12-23 MED ORDER — SODIUM CHLORIDE 0.9 % IV SOLN
INTRAVENOUS | Status: DC
  Administered 2015-12-23: 12:00:00 via INTRAVENOUS

## 2015-12-23 MED ORDER — MIDAZOLAM HCL 2 MG/2ML IJ SOLN
INTRAMUSCULAR | Status: AC | PRN
  Administered 2015-12-23 (×5): 1 mg via INTRAVENOUS

## 2015-12-23 NOTE — Procedures (Signed)
Interventional Radiology Procedure Note  Procedure: Placement of a right IJ approach single lumen PowerPort.  Tip is positioned at the superior cavoatrial junction and catheter is ready for immediate use.  Complications: No immediate Recommendations:  - Ok to shower tomorrow - Do not submerge for 7 days - Routine line care   Signed,  Vesna Kable K. Rosaly Labarbera, MD   

## 2015-12-23 NOTE — H&P (Signed)
Chief Complaint: poor venous access  Referring Physician: Dr. Angelica Chessman  Supervising Physician: Jacqulynn Cadet  Patient Status:  Out-pt  HPI: [name] is an 25 y.o. female with a history of sickle cell anemia.  She struggles with poor venous access.  She was recently in the ED due to a sickle cell pain crisis.  She then followed up with Dr. Doreene Burke.  Due to poor venous access a request has been made for a PAC insertion.  She denies any complaints.  Past Medical History:  Past Medical History:  Diagnosis Date  . Blood transfusion    "lots"  . Blood transfusion without reported diagnosis   . Chronic back pain    "very severe; have knot in my back; from tight muscle; take RX and exercise for it"  . Depression 01/06/2011  . Exertional dyspnea    "sometimes"  . Genital HSV   . GERD (gastroesophageal reflux disease) 02/17/2011  . Migraines 11/08/11   "@ least twice/month"  . 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  . Mood swings ([place]) 11/08/11   "I go back and forth; real bad"  . Sickle cell anemia ([name])   . Sickle cell anemia with crisis ([place])   . Trichotillomania    h/o    Past Surgical History:  Past Surgical History:  Procedure Laterality Date  . CHOLECYSTECTOMY  05/2010  . DILATION AND CURETTAGE OF UTERUS  02/20/11   S/P miscarriage    Family History:  Family History  Problem Relation Age of Onset  . Sickle cell trait Mother   . Sickle cell trait Father   . Diabetes Maternal Grandmother   . Diabetes Paternal Grandmother   . Hypertension Paternal Grandmother   . Diabetes Maternal Grandfather     Social History:  reports that she quit smoking about 2 years ago. Her smoking use included Cigarettes. She has a 0.25 pack-year smoking history. She has never used smokeless tobacco. She reports that she uses drugs, including Other-see comments. She reports that she does not drink alcohol.  Allergies:  Allergies  Allergen Reactions  .  Carrot [Daucus Carota] Hives, Swelling and Rash    Dietary:  Please do not send any form of carrot to patient  . Carrot Oil Hives and Swelling  . Latex Rash    *powdered Latex*     Medications: Medications reviewed in Epic  Please HPI for pertinent positives, otherwise complete 10 system ROS negative.  Mallampati Score: MD Evaluation Airway: WNL Heart: WNL Abdomen: WNL Chest/ Lungs: WNL ASA  Classification: 2 Mallampati/Airway Score: One  Physical Exam: BP 110/70   Pulse 83   Temp 99 F (37.2 C) (Oral)   Resp 18   LMP 12/11/2015 (Approximate)   SpO2 100%  There is no height or weight on file to calculate BMI. General: pleasant, WD, WN black female who is laying in bed in NAD HEENT: head is normocephalic, atraumatic.  Sclera are noninjected.  PERRL.  Ears and nose without any masses or lesions.  Mouth is pink and moist Heart: regular, rate, and rhythm.  Normal s1,s2. No obvious murmurs, gallops, or rubs noted.  Palpable radial and pedal pulses bilaterally Lungs: CTAB, no wheezes, rhonchi, or rales noted.  Respiratory effort nonlabored Abd: soft, NT, ND, +BS, no masses, hernias, or organomegaly Psych: A&Ox3 with an appropriate affect.   Labs: Pending  Imaging: No results found.  Assessment/Plan 1. Poor Venous Access, SSA -we will plan to place a PAC today.  The patient has no infectious symptoms or other complaints. -her labs are pending, but her vital signs have been reviewed -Risks and Benefits discussed with the patient including, but not limited to bleeding, infection, pneumothorax, or fibrin sheath development and need for additional procedures. All of the patient's questions were answered, patient is agreeable to proceed. Consent signed and in chart.  Thank you for this interesting consult.  I greatly enjoyed meeting [name] and look forward to participating in their care.  A copy of this report was sent to the requesting provider on this  date.  Electronically Signed: Henreitta Cea 12/23/2015, 11:52 AM   I spent a total of  30 Minutes   in face to face in clinical consultation, greater than 50% of which was counseling/coordinating care for poor venous access

## 2015-12-23 NOTE — Discharge Instructions (Signed)
Implanted Port Home Guide °An implanted port is a type of central line that is placed under the skin. Central lines are used to provide IV access when treatment or nutrition needs to be given through a person's veins. Implanted ports are used for long-term IV access. An implanted port may be placed because:  °· You need IV medicine that would be irritating to the small veins in your hands or arms.   °· You need long-term IV medicines, such as antibiotics.   °· You need IV nutrition for a long period.   °· You need frequent blood draws for lab tests.   °· You need dialysis.   °Implanted ports are usually placed in the chest area, but they can also be placed in the upper arm, the abdomen, or the leg. An implanted port has two main parts:  °· Reservoir. The reservoir is round and will appear as a small, raised area under your skin. The reservoir is the part where a needle is inserted to give medicines or draw blood.   °· Catheter. The catheter is a thin, flexible tube that extends from the reservoir. The catheter is placed into a large vein. Medicine that is inserted into the reservoir goes into the catheter and then into the vein.   °HOW WILL I CARE FOR MY INCISION SITE? °Do not get the incision site wet. Bathe or shower as directed by your health care provider.  °HOW IS MY PORT ACCESSED? °Special steps must be taken to access the port:  °· Before the port is accessed, a numbing cream can be placed on the skin. This helps numb the skin over the port site.   °· Your health care provider uses a sterile technique to access the port. °¨ Your health care provider must put on a mask and sterile gloves. °¨ The skin over your port is cleaned carefully with an antiseptic and allowed to dry. °¨ The port is gently pinched between sterile gloves, and a needle is inserted into the port. °· Only "non-coring" port needles should be used to access the port. Once the port is accessed, a blood return should be checked. This helps  ensure that the port is in the vein and is not clogged.   °· If your port needs to remain accessed for a constant infusion, a clear (transparent) bandage will be placed over the needle site. The bandage and needle will need to be changed every week, or as directed by your health care provider.   °· Keep the bandage covering the needle clean and dry. Do not get it wet. Follow your health care provider's instructions on how to take a shower or bath while the port is accessed.   °· If your port does not need to stay accessed, no bandage is needed over the port.   °WHAT IS FLUSHING? °Flushing helps keep the port from getting clogged. Follow your health care provider's instructions on how and when to flush the port. Ports are usually flushed with saline solution or a medicine called heparin. The need for flushing will depend on how the port is used.  °· If the port is used for intermittent medicines or blood draws, the port will need to be flushed:   °¨ After medicines have been given.   °¨ After blood has been drawn.   °¨ As part of routine maintenance.   °· If a constant infusion is running, the port may not need to be flushed.   °HOW LONG WILL MY PORT STAY IMPLANTED? °The port can stay in for as long as your health care   provider thinks it is needed. When it is time for the port to come out, surgery will be done to remove it. The procedure is similar to the one performed when the port was put in.  °WHEN SHOULD I SEEK IMMEDIATE MEDICAL CARE? °When you have an implanted port, you should seek immediate medical care if:  °· You notice a bad smell coming from the incision site.   °· You have swelling, redness, or drainage at the incision site.   °· You have more swelling or pain at the port site or the surrounding area.   °· You have a fever that is not controlled with medicine. °  °This information is not intended to replace advice given to you by your health care provider. Make sure you discuss any questions you have with  your health care provider. °  °Document Released: 05/02/2005 Document Revised: 02/20/2013 Document Reviewed: 01/07/2013 °Elsevier Interactive Patient Education ©2016 [employer] °Implanted Port Insertion, Care After °Refer to this sheet in the next few weeks. These instructions provide you with information on caring for yourself after your procedure. Your health care provider may also give you more specific instructions. Your treatment has been planned according to current medical practices, but problems sometimes occur. Call your health care provider if you have any problems or questions after your procedure. °WHAT TO EXPECT AFTER THE PROCEDURE °After your procedure, it is typical to have the following:  °· Discomfort at the port insertion site. Ice packs to the area will help. °· Bruising on the skin over the port. This will subside in 3-4 days. °HOME CARE INSTRUCTIONS °· After your port is placed, you will get a manufacturer's information card. The card has information about your port. Keep this card with you at all times.   °· Know what kind of port you have. There are many types of ports available.   °· Wear a medical alert bracelet in case of an emergency. This can help alert health care workers that you have a port.   °· The port can stay in for as long as your health care provider believes it is necessary.   °· A home health care nurse may give medicines and take care of the port.   °· You or a family member can get special training and directions for giving medicine and taking care of the port at home.   °SEEK MEDICAL CARE IF:  °· Your port does not flush or you are unable to get a blood return.   °· You have a fever or chills. °SEEK IMMEDIATE MEDICAL CARE IF: °· You have new fluid or pus coming from your incision.   °· You notice a bad smell coming from your incision site.   °· You have swelling, pain, or more redness at the incision or port site.   °· You have chest pain or shortness of breath. °  °This  information is not intended to replace advice given to you by your health care provider. Make sure you discuss any questions you have with your health care provider. °  °Document Released: 02/20/2013 Document Revised: 05/07/2013 Document Reviewed: 02/20/2013 °Elsevier Interactive Patient Education ©2016 [employer] °Moderate Conscious Sedation, Adult °Sedation is the use of medicines to promote relaxation and relieve discomfort and anxiety. Moderate conscious sedation is a type of sedation. Under moderate conscious sedation you are less alert than normal but are still able to respond to instructions or stimulation. Moderate conscious sedation is used during short medical and dental procedures. It is milder than deep sedation or general anesthesia and   allows you to return to your regular activities sooner. °LET YOUR HEALTH CARE PROVIDER KNOW ABOUT:  °· Any allergies you have. °· All medicines you are taking, including vitamins, herbs, eye drops, creams, and over-the-counter medicines. °· Use of steroids (by mouth or creams). °· Previous problems you or members of your family have had with the use of anesthetics. °· Any blood disorders you have. °· Previous surgeries you have had. °· Medical conditions you have. °· Possibility of pregnancy, if this applies. °· Use of cigarettes, alcohol, or illegal drugs. °RISKS AND COMPLICATIONS °Generally, this is a safe procedure. However, as with any procedure, problems can occur. Possible problems include: °· Oversedation. °· Trouble breathing on your own. You may need to have a breathing tube until you are awake and breathing on your own. °· Allergic reaction to any of the medicines used for the procedure. °BEFORE THE PROCEDURE °· You may have blood tests done. These tests can help show how well your kidneys and liver are working. They can also show how well your blood clots. °· A physical exam will be done.   °· Only take medicines as directed by your health care provider.  You may need to stop taking medicines (such as blood thinners, aspirin, or nonsteroidal anti-inflammatory drugs) before the procedure.   °· Do not eat or drink at least 6 hours before the procedure or as directed by your health care provider. °· Arrange for a responsible adult, family member, or friend to take you home after the procedure. He or she should stay with you for at least 24 hours after the procedure, until the medicine has worn off. °PROCEDURE  °· An intravenous (IV) catheter will be inserted into one of your veins. Medicine will be able to flow directly into your body through this catheter. You may be given medicine through this tube to help prevent pain and help you relax. °· The medical or dental procedure will be done. °AFTER THE PROCEDURE °· You will stay in a recovery area until the medicine has worn off. Your blood pressure and pulse will be checked.   °·  Depending on the procedure you had, you may be allowed to go home when you can tolerate liquids and your pain is under control. °  °This information is not intended to replace advice given to you by your health care provider. Make sure you discuss any questions you have with your health care provider. °  °Document Released: 01/25/2001 Document Revised: 05/23/2014 Document Reviewed: 01/07/2013 °Elsevier Interactive Patient Education ©2016 [employer] ° °

## 2015-12-25 ENCOUNTER — Telehealth: Payer: Self-pay

## 2015-12-26 ENCOUNTER — Encounter (HOSPITAL_COMMUNITY): Payer: Self-pay

## 2015-12-26 ENCOUNTER — Inpatient Hospital Stay (HOSPITAL_COMMUNITY)
Admission: EM | Admit: 2015-12-26 | Discharge: 2015-12-29 | DRG: 812 | Disposition: A | Discharge status: Home / Self Care | Payer: Medicare Other | Attending: Internal Medicine | Admitting: Internal Medicine

## 2015-12-26 DIAGNOSIS — Z9049 Acquired absence of other specified parts of digestive tract: Secondary | ICD-10-CM | POA: Diagnosis not present

## 2015-12-26 DIAGNOSIS — Z87891 Personal history of nicotine dependence: Secondary | ICD-10-CM

## 2015-12-26 DIAGNOSIS — D638 Anemia in other chronic diseases classified elsewhere: Secondary | ICD-10-CM | POA: Diagnosis present

## 2015-12-26 DIAGNOSIS — Z79899 Other long term (current) drug therapy: Secondary | ICD-10-CM | POA: Diagnosis not present

## 2015-12-26 DIAGNOSIS — Z833 Family history of diabetes mellitus: Secondary | ICD-10-CM

## 2015-12-26 DIAGNOSIS — F329 Major depressive disorder, single episode, unspecified: Secondary | ICD-10-CM | POA: Diagnosis not present

## 2015-12-26 DIAGNOSIS — Z8249 Family history of ischemic heart disease and other diseases of the circulatory system: Secondary | ICD-10-CM

## 2015-12-26 DIAGNOSIS — G43909 Migraine, unspecified, not intractable, without status migrainosus: Secondary | ICD-10-CM | POA: Diagnosis present

## 2015-12-26 DIAGNOSIS — D5700 Hb-SS disease with crisis, unspecified: Secondary | ICD-10-CM | POA: Diagnosis not present

## 2015-12-26 DIAGNOSIS — R112 Nausea with vomiting, unspecified: Secondary | ICD-10-CM | POA: Diagnosis present

## 2015-12-26 DIAGNOSIS — K219 Gastro-esophageal reflux disease without esophagitis: Secondary | ICD-10-CM | POA: Diagnosis not present

## 2015-12-26 DIAGNOSIS — D72829 Elevated white blood cell count, unspecified: Secondary | ICD-10-CM | POA: Diagnosis present

## 2015-12-26 LAB — CBC WITH DIFFERENTIAL/PLATELET
BASOS ABS: 0 10*3/uL (ref 0.0–0.1)
BASOS PCT: 0 %
EOS PCT: 3 %
Eosinophils Absolute: 0.5 10*3/uL (ref 0.0–0.7)
HEMATOCRIT: 23.5 % — AB (ref 36.0–46.0)
HEMOGLOBIN: 8 g/dL — AB (ref 12.0–15.0)
LYMPHS PCT: 17 %
Lymphs Abs: 2.9 10*3/uL (ref 0.7–4.0)
MCH: 28.2 pg (ref 26.0–34.0)
MCHC: 34 g/dL (ref 30.0–36.0)
MCV: 82.7 fL (ref 78.0–100.0)
MONOS PCT: 10 %
Monocytes Absolute: 1.7 10*3/uL — ABNORMAL HIGH (ref 0.1–1.0)
NEUTROS ABS: 12 10*3/uL — AB (ref 1.7–7.7)
Neutrophils Relative %: 70 %
Platelets: 654 10*3/uL — ABNORMAL HIGH (ref 150–400)
RBC: 2.84 MIL/uL — ABNORMAL LOW (ref 3.87–5.11)
RDW: 16.7 % — ABNORMAL HIGH (ref 11.5–15.5)
WBC: 17.1 10*3/uL — ABNORMAL HIGH (ref 4.0–10.5)

## 2015-12-26 LAB — RETICULOCYTES
RBC.: 2.79 MIL/uL — AB (ref 3.87–5.11)
Retic Count, Absolute: 212 10*3/uL — ABNORMAL HIGH (ref 19.0–186.0)
Retic Ct Pct: 7.6 % — ABNORMAL HIGH (ref 0.4–3.1)

## 2015-12-26 LAB — COMPREHENSIVE METABOLIC PANEL
ALBUMIN: 4.1 g/dL (ref 3.5–5.0)
ALT: 12 U/L — ABNORMAL LOW (ref 14–54)
ANION GAP: 7 (ref 5–15)
AST: 20 U/L (ref 15–41)
Alkaline Phosphatase: 58 U/L (ref 38–126)
BUN: 11 mg/dL (ref 6–20)
CHLORIDE: 106 mmol/L (ref 101–111)
CO2: 24 mmol/L (ref 22–32)
Calcium: 8.9 mg/dL (ref 8.9–10.3)
Creatinine, Ser: 0.66 mg/dL (ref 0.44–1.00)
GFR calc Af Amer: >60 mL/min (ref >60)
GFR calc non Af Amer: >60 mL/min (ref >60)
GLUCOSE: 117 mg/dL — AB (ref 65–99)
POTASSIUM: 3.5 mmol/L (ref 3.5–5.1)
SODIUM: 137 mmol/L (ref 135–145)
TOTAL PROTEIN: 7.6 g/dL (ref 6.5–8.1)
Total Bilirubin: 1.8 mg/dL — ABNORMAL HIGH (ref 0.3–1.2)

## 2015-12-26 MED ORDER — SODIUM CHLORIDE 0.9 % IV BOLUS (SEPSIS)
1000.0000 mL | Freq: Once | INTRAVENOUS | Status: AC
  Administered 2015-12-26: 1000 mL via INTRAVENOUS

## 2015-12-26 MED ORDER — HYDROMORPHONE HCL 2 MG/ML IJ SOLN
2.0000 mg | Freq: Once | INTRAMUSCULAR | Status: AC
  Administered 2015-12-26: 2 mg via INTRAVENOUS
  Filled 2015-12-26: qty 1

## 2015-12-26 MED ORDER — ONDANSETRON HCL 4 MG/2ML IJ SOLN
4.0000 mg | Freq: Once | INTRAMUSCULAR | Status: AC
  Administered 2015-12-26: 4 mg via INTRAVENOUS
  Filled 2015-12-26: qty 2

## 2015-12-26 MED ORDER — DIPHENHYDRAMINE HCL 25 MG PO CAPS
50.0000 mg | ORAL_CAPSULE | Freq: Once | ORAL | Status: AC
  Administered 2015-12-26: 50 mg via ORAL
  Filled 2015-12-26: qty 2

## 2015-12-26 NOTE — ED Provider Notes (Signed)
[facility] DEPT Provider Note   CSN: [id number] Arrival date & time: 12/26/15  2225  First Provider Contact:  First MD Initiated Contact with Patient 12/26/15 2303   By signing my name below, I, Dora Sims, attest that this documentation has been prepared under the direction and in the presence of Charlann Lange, PA-C. Electronically Signed: Dora Sims, Scribe. 12/26/2015. 11:03 PM.   History   Chief Complaint Chief Complaint  Patient presents with  . Sickle Cell Pain Crisis  . Nausea    The history is provided by the patient. No language interpreter was used.     HPI Comments: [name] is a 25 y.o. female with PMHx of sickle cell anemia and chronic back pain who presents to the Emergency Department complaining of sudden onset, constant, back pain beginning several hours ago. She also notes abdominal pain, nausea, and vomiting x2; she reports her abdominal pain began last night. She states her symptoms are similar to previous pains associated with sickle cell. She has not had any BM's today and denies passing gas as well. She notes h/o cholecystectomy but denies h/o splenectomy. Pt states she has been taking her regular home medications as instructed. She denies any other symptoms or complaints.  Past Medical History:  Diagnosis Date  . Blood transfusion    "lots"  . Blood transfusion without reported diagnosis   . Chronic back pain    "very severe; have knot in my back; from tight muscle; take RX and exercise for it"  . Depression 01/06/2011  . Exertional dyspnea    "sometimes"  . Genital HSV   . GERD (gastroesophageal reflux disease) 02/17/2011  . Migraines 11/08/11   "@ least twice/month"  . 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  . Mood swings ([place]) 11/08/11   "I go back and forth; real bad"  . Sickle cell anemia ([name])   . Sickle cell anemia with crisis ([place])   . Trichotillomania    h/o    Patient Active Problem List   Diagnosis Date  Noted  . Sickle cell crisis ([place]) 11/09/2015  . Sepsis ([place]) 11/09/2015  . Thrombocytosis ([place]) 11/09/2015  . Vaginal bleeding 11/09/2015  . Hyperbilirubinemia 11/09/2015  . Pyrexia   . Vaginal delivery 10/31/2015  . Hb-SS disease with crisis ([place])   . Sickle cell pain crisis ([place]) 10/26/2015  . Positive GBS test 10/18/2015  . Narcotic dependence ([place]) 10/06/2015  . 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 High risk for intrapartum complications, antepartum 08/12/2015  . Chronic pain 08/04/2015  . Cocaine use 07/24/2015  . Maternal sickle cell anemia affecting pregnancy, antepartum ([place]) 07/14/2015  . Anemia of chronic disease   . 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 Migraines 11/08/2011  . GERD (gastroesophageal reflux disease) 02/17/2011  . Depression 01/06/2011  . Sickle cell disease ([place]) 01/08/2009  . 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 CHOLECYSTECTOMY  05/2010  . DILATION AND CURETTAGE OF UTERUS  02/20/11   S/P miscarriage  . IR GENERIC HISTORICAL  12/23/2015   IR FLUORO GUIDE CV LINE RIGHT 12/23/2015 Jacqulynn Cadet, MD [facility]-INTERV RAD  . IR GENERIC HISTORICAL  12/23/2015   IR US GUIDE VASC ACCESS RIGHT 12/23/2015 Jacqulynn Cadet, MD [facility]-INTER[facility] RAD    OB History    Gravida Para Term Preterm AB Living   3 2 2   1  2  SAB TAB Ectopic Multiple Live Births   1     0 2      Obstetric Comments   Miscarried in October 2012 at about 7 weeks       Home Medications    Prior to Admission medications   Medication Sig Start Date End Date Taking? Authorizing Provider  aspirin-acetaminophen-caffeine (EXCEDRIN MIGRAINE) [phone] MG tablet Take 1 tablet by mouth every 6 (six) hours as needed for headache.    Historical Provider, MD  calcium carbonate (TUMS - DOSED IN MG ELEMENTAL CALCIUM) 500 MG chewable tablet Chew 2 tablets  by mouth 2 (two) times daily as needed for indigestion or heartburn.    Historical Provider, MD  hydroxyurea (HYDREA) 500 MG capsule Take 1 capsule (500 mg total) by mouth daily. May take with food to minimize GI side effects. 12/15/15   Tresa Garter, MD  ibuprofen (ADVIL,MOTRIN) 600 MG tablet Take 1 tablet (600 mg total) by mouth every 6 (six) hours. 11/02/15   Caren Macadam, MD  methadone (DOLOPHINE) 5 MG tablet Take 1 tablet (5 mg total) by mouth daily. 12/21/15   Micheline Chapman, NP  oxyCODONE (ROXICODONE) 15 MG immediate release tablet Take 1 tablet (15 mg total) by mouth every 4 (four) hours as needed for pain. 12/15/15   Tresa Garter, MD  valACYclovir (VALTREX) 500 MG tablet Take 1,000 mg by mouth daily as needed (for flares.).  12/01/15   Historical Provider, MD    Family History Family History  Problem Relation Age of Onset  . Sickle cell trait Mother   . Sickle cell trait Father   . Diabetes Maternal Grandmother   . Diabetes Paternal Grandmother   . Hypertension Paternal Grandmother   . Diabetes Maternal Grandfather     Social History Social History  Substance Use Topics  . Smoking status: Former Smoker    Packs/day: 0.25    Years: 1.00    Types: Cigarettes    Quit date: 03/25/2013  . Smokeless tobacco: Never Used  . Alcohol use No     Comment: pt states she quit marijuan in May 2013. Rare ETOH, + cigarettes.  She is enrolled in school     Allergies   Carrot [daucus carota]; Carrot oil; and Latex   Review of Systems Review of Systems  Gastrointestinal: Positive for abdominal pain, nausea and vomiting.  Musculoskeletal: Positive for back pain.     Physical Exam Updated Vital Signs BP 121/81   Pulse 114   Temp 98.1 F (36.7 C) (Oral)   Resp 18   Ht 5\' 1"  (1.549 m)   Wt 167 lb (75.8 kg)   LMP 12/11/2015 (Approximate)   SpO2 100%   BMI 31.55 kg/m   Physical Exam  Constitutional: She is oriented to person, place, and time. She appears  well-developed and well-nourished. No distress.  HENT:  Head: Normocephalic and atraumatic.  Eyes: Conjunctivae and EOM are normal.  Neck: Neck supple. No tracheal deviation present.  Cardiovascular: Normal rate and regular rhythm.   No murmur heard. Pulmonary/Chest: Effort normal and breath sounds normal. No respiratory distress.  Lungs are clear to auscultation.  Abdominal: She exhibits no distension. There is tenderness.  Diffuse abdominal tenderness, no distension.  Musculoskeletal: Normal range of motion.  Neurological: She is alert and oriented to person, place, and time.  Skin: Skin is warm and dry.  Psychiatric: She has a normal mood and affect. Her behavior is normal.  Nursing note and vitals reviewed.  ED Treatments / Results  Labs (all labs ordered are listed, but only abnormal results are displayed) Labs Reviewed  COMPREHENSIVE METABOLIC PANEL  CBC WITH DIFFERENTIAL/PLATELET  RETICULOCYTES    EKG  EKG Interpretation None       Radiology No results found.  Procedures Procedures (including critical care time)  DIAGNOSTIC STUDIES: Oxygen Saturation is 100% on RA, normal by my interpretation.    COORDINATION OF CARE: 11:03 PM Discussed treatment plan with pt at bedside and pt agreed to plan.   Medications Ordered in ED Medications  HYDROmorphone (DILAUDID) injection 2 mg (not administered)  sodium chloride 0.9 % bolus 1,000 mL (not administered)  diphenhydrAMINE (BENADRYL) capsule 50 mg (not administered)  ondansetron (ZOFRAN) injection 4 mg (not administered)     Initial Impression / Assessment and Plan / ED Course  I have reviewed the triage vital signs and the nursing notes.  Pertinent labs & imaging results that were available during my care of the patient were reviewed by me and considered in my medical decision making (see chart for details).  Clinical Course    Patient with sickle cell anemia presents with typical pain in lower back and  bilateral hips. No chest pain or fever. Multiple admissions for crises.   Attempt to manage pain is unsuccessful and after 4 doses of Dilaudid without improvement patient is admitted to [facility].    I personally performed the services described in this documentation, which was scribed in my presence. The recorded information has been reviewed and is accurate.    Final Clinical Impressions(s) / ED Diagnoses   Final diagnoses:  None  Sickle cell anemia with pain  New Prescriptions New Prescriptions   No medications on file     Charlann Lange, PA-C 12/27/15 [place], MD 12/27/15 [phone]

## 2015-12-26 NOTE — ED Triage Notes (Signed)
Pt states that she started having sickle cell pain in her back about 4-5 hours ago when she woke up. Pt is also complaining of nausea and states that she has had 2 episodes of emesis today. Pain 10/10.

## 2015-12-27 DIAGNOSIS — G43909 Migraine, unspecified, not intractable, without status migrainosus: Secondary | ICD-10-CM | POA: Diagnosis present

## 2015-12-27 DIAGNOSIS — K219 Gastro-esophageal reflux disease without esophagitis: Secondary | ICD-10-CM | POA: Diagnosis present

## 2015-12-27 DIAGNOSIS — R112 Nausea with vomiting, unspecified: Secondary | ICD-10-CM | POA: Diagnosis not present

## 2015-12-27 DIAGNOSIS — Z833 Family history of diabetes mellitus: Secondary | ICD-10-CM | POA: Diagnosis not present

## 2015-12-27 DIAGNOSIS — D5700 Hb-SS disease with crisis, unspecified: Secondary | ICD-10-CM | POA: Diagnosis not present

## 2015-12-27 DIAGNOSIS — Z8249 Family history of ischemic heart disease and other diseases of the circulatory system: Secondary | ICD-10-CM | POA: Diagnosis not present

## 2015-12-27 DIAGNOSIS — Z9049 Acquired absence of other specified parts of digestive tract: Secondary | ICD-10-CM | POA: Diagnosis not present

## 2015-12-27 DIAGNOSIS — D638 Anemia in other chronic diseases classified elsewhere: Secondary | ICD-10-CM | POA: Diagnosis not present

## 2015-12-27 DIAGNOSIS — F329 Major depressive disorder, single episode, unspecified: Secondary | ICD-10-CM | POA: Diagnosis present

## 2015-12-27 DIAGNOSIS — D72829 Elevated white blood cell count, unspecified: Secondary | ICD-10-CM | POA: Diagnosis present

## 2015-12-27 DIAGNOSIS — Z79899 Other long term (current) drug therapy: Secondary | ICD-10-CM | POA: Diagnosis not present

## 2015-12-27 DIAGNOSIS — Z87891 Personal history of nicotine dependence: Secondary | ICD-10-CM | POA: Diagnosis not present

## 2015-12-27 LAB — RAPID URINE DRUG SCREEN, HOSP PERFORMED
AMPHETAMINES: POSITIVE — AB
Barbiturates: NOT DETECTED
Benzodiazepines: NOT DETECTED
Cocaine: NOT DETECTED
Opiates: POSITIVE — AB
TETRAHYDROCANNABINOL: NOT DETECTED

## 2015-12-27 LAB — HCG, QUANTITATIVE, PREGNANCY: hCG, Beta Chain, Quant, S: 1 m[IU]/mL (ref <5)

## 2015-12-27 LAB — LIPASE, BLOOD: LIPASE: 25 U/L (ref 11–51)

## 2015-12-27 LAB — LACTATE DEHYDROGENASE: LDH: 256 U/L — AB (ref 98–192)

## 2015-12-27 MED ORDER — HYDROMORPHONE HCL 2 MG/ML IJ SOLN
2.0000 mg | Freq: Once | INTRAMUSCULAR | Status: AC
  Administered 2015-12-27: 2 mg via INTRAVENOUS
  Filled 2015-12-27: qty 1

## 2015-12-27 MED ORDER — DIPHENHYDRAMINE HCL 50 MG PO CAPS
50.0000 mg | ORAL_CAPSULE | Freq: Four times a day (QID) | ORAL | Status: DC | PRN
  Administered 2015-12-27 – 2015-12-29 (×3): 50 mg via ORAL
  Filled 2015-12-27 (×3): qty 1

## 2015-12-27 MED ORDER — ASPIRIN-ACETAMINOPHEN-CAFFEINE 250-250-65 MG PO TABS
1.0000 | ORAL_TABLET | Freq: Four times a day (QID) | ORAL | Status: DC | PRN
  Filled 2015-12-27: qty 1

## 2015-12-27 MED ORDER — DEXTROSE-NACL 5-0.45 % IV SOLN
INTRAVENOUS | Status: DC
  Administered 2015-12-27 (×3): via INTRAVENOUS
  Administered 2015-12-27 – 2015-12-28 (×3): 1000 mL via INTRAVENOUS
  Administered 2015-12-28: 18:00:00 via INTRAVENOUS

## 2015-12-27 MED ORDER — SODIUM CHLORIDE 0.9% FLUSH: 9.0000 mL | INTRAVENOUS | Status: DC | PRN

## 2015-12-27 MED ORDER — DIPHENHYDRAMINE HCL 50 MG/ML IJ SOLN
12.5000 mg | Freq: Four times a day (QID) | INTRAMUSCULAR | Status: DC | PRN
  Administered 2015-12-27: 12.5 mg via INTRAVENOUS
  Filled 2015-12-27: qty 1

## 2015-12-27 MED ORDER — ENOXAPARIN SODIUM 40 MG/0.4ML ~~LOC~~ SOLN
40.0000 mg | SUBCUTANEOUS | Status: DC
  Administered 2015-12-27: 40 mg via SUBCUTANEOUS
  Filled 2015-12-27 (×3): qty 0.4

## 2015-12-27 MED ORDER — IBUPROFEN 200 MG PO TABS
600.0000 mg | ORAL_TABLET | Freq: Four times a day (QID) | ORAL | Status: DC
Start: 2015-12-27 — End: 2015-12-29
  Administered 2015-12-27 – 2015-12-29 (×10): 600 mg via ORAL
  Filled 2015-12-27 (×10): qty 3

## 2015-12-27 MED ORDER — DIPHENHYDRAMINE HCL 25 MG PO CAPS
25.0000 mg | ORAL_CAPSULE | Freq: Four times a day (QID) | ORAL | Status: DC | PRN
  Administered 2015-12-27: 25 mg via ORAL
  Filled 2015-12-27: qty 1

## 2015-12-27 MED ORDER — HYDROMORPHONE HCL 2 MG/ML IJ SOLN
2.0000 mg | INTRAMUSCULAR | Status: DC | PRN
  Administered 2015-12-27 – 2015-12-29 (×15): 2 mg via INTRAVENOUS
  Filled 2015-12-27 (×14): qty 1

## 2015-12-27 MED ORDER — HYDROMORPHONE HCL 2 MG/ML IJ SOLN
INTRAMUSCULAR | Status: AC
  Filled 2015-12-27: qty 1

## 2015-12-27 MED ORDER — HYDROXYUREA 500 MG PO CAPS
500.0000 mg | ORAL_CAPSULE | Freq: Every day | ORAL | Status: DC
  Administered 2015-12-27 – 2015-12-29 (×3): 500 mg via ORAL
  Filled 2015-12-27 (×3): qty 1

## 2015-12-27 MED ORDER — CALCIUM CARBONATE ANTACID 500 MG PO CHEW
2.0000 | CHEWABLE_TABLET | Freq: Two times a day (BID) | ORAL | Status: DC | PRN
  Filled 2015-12-27: qty 2

## 2015-12-27 MED ORDER — NALOXONE HCL 0.4 MG/ML IJ SOLN: 0.4000 mg | INTRAMUSCULAR | Status: DC | PRN

## 2015-12-27 MED ORDER — POLYETHYLENE GLYCOL 3350 17 G PO PACK
17.0000 g | PACK | Freq: Every day | ORAL | Status: DC | PRN
  Administered 2015-12-28: 17 g via ORAL
  Filled 2015-12-27 (×2): qty 1

## 2015-12-27 MED ORDER — ONDANSETRON HCL 4 MG/2ML IJ SOLN: 4.0000 mg | Freq: Three times a day (TID) | INTRAMUSCULAR | Status: DC | PRN

## 2015-12-27 MED ORDER — METHADONE HCL 5 MG PO TABS
5.0000 mg | ORAL_TABLET | Freq: Every day | ORAL | Status: DC
  Administered 2015-12-27 – 2015-12-29 (×3): 5 mg via ORAL
  Filled 2015-12-27 (×3): qty 1

## 2015-12-27 MED ORDER — SENNOSIDES-DOCUSATE SODIUM 8.6-50 MG PO TABS
1.0000 | ORAL_TABLET | Freq: Two times a day (BID) | ORAL | Status: DC
  Administered 2015-12-27 – 2015-12-29 (×5): 1 via ORAL
  Filled 2015-12-27 (×5): qty 1

## 2015-12-27 MED ORDER — DIPHENHYDRAMINE HCL 12.5 MG/5ML PO ELIX
12.5000 mg | ORAL_SOLUTION | Freq: Four times a day (QID) | ORAL | Status: DC | PRN
  Filled 2015-12-27: qty 5

## 2015-12-27 MED ORDER — HYDROMORPHONE HCL 1 MG/ML IJ SOLN
1.0000 mg | Freq: Once | INTRAMUSCULAR | Status: AC
  Administered 2015-12-27: 1 mg via INTRAVENOUS
  Filled 2015-12-27: qty 1

## 2015-12-27 MED ORDER — HYDROMORPHONE 1 MG/ML IV SOLN
INTRAVENOUS | Status: DC
  Administered 2015-12-27: 4.9 mg via INTRAVENOUS
  Administered 2015-12-27: 6.6 mg via INTRAVENOUS
  Administered 2015-12-27: 1 mg via INTRAVENOUS
  Administered 2015-12-27: 7 mg via INTRAVENOUS
  Administered 2015-12-27: 25 mg via INTRAVENOUS
  Administered 2015-12-27: 4.2 mg via INTRAVENOUS
  Administered 2015-12-28: 6.3 mg via INTRAVENOUS
  Administered 2015-12-28: 9.1 mg via INTRAVENOUS
  Administered 2015-12-28: 5.6 mg via INTRAVENOUS
  Administered 2015-12-28: 2.1 mg via INTRAVENOUS
  Administered 2015-12-28: 3.5 mg via INTRAVENOUS
  Administered 2015-12-28: 4.9 mg via INTRAVENOUS
  Administered 2015-12-28: 18:00:00 via INTRAVENOUS
  Administered 2015-12-29: 4.2 mg via INTRAVENOUS
  Administered 2015-12-29: 14:00:00 via INTRAVENOUS
  Filled 2015-12-27 (×4): qty 25

## 2015-12-27 NOTE — ED Notes (Signed)
PA at bedside.

## 2015-12-27 NOTE — H&P (Signed)
History and Physical    [name] [id number] DOB: 21-Apr-1991 DOA: 12/26/2015  Referring MD/NP/PA:   PCP: Angelica Chessman, MD   Patient coming from:  The patient is coming from home.  At baseline, pt is independent for most of ADL.        Chief Complaint: back pain, nausea and vomiting  HPI: [name] is a 25 y.o. female with medical history significant of sickle cell disease, migraine headache, GERD, depression, who presents with beck pain, nausea and vomiting.  Patient states that she started having sudden onset back pain last night. Initially her back pain is in lower back, but now involving the whole back, worse in lower back. The pain is constant, 10 out of 10 in severity, nonradiating. It is not aggravated or alleviated by any known factors. She states her symptoms are similar to previous pains associated with sickle cell. No leg numbness or weakness. No incontinence of bladder or bowel movement. She has nausea and vomited twice without blood in the vomitus. She had mild abdominal pain, which has resolved. No diarrhea. Patient denies chest pain, cough, shortness breath, fever or chills. No unilateral weakness.  ED Course: pt was found to have hemoglobin 10.0 on 12/15/15-->8.0 today, WBC 17.1, temperature normal, slightly tachycardia, no tachypnea, O2 sat 100% on RA, electrolytes renal function okay. Patient is placed on MedSurg bed for further eval and treatment.  Review of Systems:   General: no fevers, chills, no changes in body weight, has poor appetite, has fatigue HEENT: no blurry vision, hearing changes or sore throat Pulm: no dyspnea, coughing, wheezing CV: no chest pain, no palpitations Abd: has nausea, vomiting, abdominal pain, nodiarrhea, constipation GU: no dysuria, burning on urination, increased urinary frequency, hematuria  Ext: no leg edema Neuro: no unilateral weakness, numbness, or tingling, no vision change or hearing loss Skin: no rash MSK:  has severe back pain Heme: No easy bruising.  Travel history: No recent long distant travel.  Allergy:  Allergies  Allergen Reactions  . Carrot [Daucus Carota] Hives, Swelling and Rash    Dietary:  Please do not send any form of carrot to patient  . Carrot Oil Hives and Swelling  . Latex Rash    *powdered Latex*     Past Medical History:  Diagnosis Date  . Blood transfusion    "lots"  . Blood transfusion without reported diagnosis   . Chronic back pain    "very severe; have knot in my back; from tight muscle; take RX and exercise for it"  . Depression 01/06/2011  . Exertional dyspnea    "sometimes"  . Genital HSV   . GERD (gastroesophageal reflux disease) 02/17/2011  . Migraines 11/08/11   "@ least twice/month"  . 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  . Mood swings ([place]) 11/08/11   "I go back and forth; real bad"  . Sickle cell anemia ([name])   . Sickle cell anemia with crisis ([place])   . Trichotillomania    h/o    Past Surgical History:  Procedure Laterality Date  . CHOLECYSTECTOMY  05/2010  . DILATION AND CURETTAGE OF UTERUS  02/20/11   S/P miscarriage  . IR GENERIC HISTORICAL  12/23/2015   IR FLUORO GUIDE CV LINE RIGHT 12/23/2015 Jacqulynn Cadet, MD [facility]  . IR GENERIC HISTORICAL  12/23/2015   IR US GUIDE VASC ACCESS RIGHT 12/23/2015 Jacqulynn Cadet, MD [facility]    Social History:  reports that she quit smoking about 2 years  ago. Her smoking use included Cigarettes. She has a 0.25 pack-year smoking history. She has never used smokeless tobacco. She reports that she uses drugs, including Other-see comments. She reports that she does not drink alcohol.  Family History:  Family History  Problem Relation Age of Onset  . Sickle cell trait Mother   . Sickle cell trait Father   . Diabetes Maternal Grandmother   . Diabetes Paternal Grandmother   . Hypertension Paternal Grandmother   . Diabetes Maternal Grandfather      Prior to Admission medications    Medication Sig Start Date End Date Taking? Authorizing Provider  aspirin-acetaminophen-caffeine (EXCEDRIN MIGRAINE) [phone] MG tablet Take 1 tablet by mouth every 6 (six) hours as needed for headache.   Yes Historical Provider, MD  calcium carbonate (TUMS - DOSED IN MG ELEMENTAL CALCIUM) 500 MG chewable tablet Chew 2 tablets by mouth 2 (two) times daily as needed for indigestion or heartburn.   Yes Historical Provider, MD  hydroxyurea (HYDREA) 500 MG capsule Take 1 capsule (500 mg total) by mouth daily. May take with food to minimize GI side effects. 12/15/15  Yes Tresa Garter, MD  ibuprofen (ADVIL,MOTRIN) 600 MG tablet Take 1 tablet (600 mg total) by mouth every 6 (six) hours. 11/02/15  Yes Caren Macadam, MD  methadone (DOLOPHINE) 5 MG tablet Take 1 tablet (5 mg total) by mouth daily. 12/21/15  Yes Micheline Chapman, NP  oxyCODONE (ROXICODONE) 15 MG immediate release tablet Take 1 tablet (15 mg total) by mouth every 4 (four) hours as needed for pain. 12/15/15  Yes Tresa Garter, MD  valACYclovir (VALTREX) 500 MG tablet Take 1,000 mg by mouth daily as needed (for flares.).  12/01/15  Yes Historical Provider, MD    Physical Exam: Vitals:   12/27/15 0230 12/27/15 0300 12/27/15 0330 12/27/15 0345  BP: 108/77 109/67    Pulse:   105 99  Resp:      Temp:      TempSrc:      SpO2:   95% 99%  Weight:      Height:       General: Not in acute distress HEENT:       Eyes: PERRL, EOMI, no scleral icterus.       ENT: No discharge from the ears and nose, no pharynx injection, no tonsillar enlargement.        Neck: No JVD, no bruit, no mass felt. Heme: No neck lymph node enlargement. Cardiac: S1/S2, RRR, No murmurs, No gallops or rubs. Pulm: No rales, wheezing, rhonchi or rubs. Has Port-A line in R upper chest with clean surroundings. Abd: Soft, nondistended, nontender, no rebound pain, no organomegaly, BS present. GU: No hematuria Ext: No pitting leg edema bilaterally. 2+DP/PT  pulse bilaterally. Musculoskeletal: Has lower back tenderness Skin: No rashes.  Neuro: Alert, oriented X3, cranial nerves II-XII grossly intact, moves all extremities normally.  Psych: Patient is not psychotic, no suicidal or hemocidal ideation.  Labs on Admission: I have personally reviewed following labs and imaging studies  CBC:  Recent Labs Lab 12/26/15 2307  WBC 17.1*  NEUTROABS 12.0*  HGB 8.0*  HCT 23.5*  MCV 82.7  PLT 0000000*   Basic Metabolic Panel:  Recent Labs Lab 12/26/15 2307  NA 137  K 3.5  CL 106  CO2 24  GLUCOSE 117*  BUN 11  CREATININE 0.66  CALCIUM 8.9   GFR: Estimated Creatinine Clearance: 101.9 mL/min (by C-G formula based on SCr of 0.8 mg/dL). Liver Function  Tests:  Recent Labs Lab 12/26/15 2307  AST 20  ALT 12*  ALKPHOS 58  BILITOT 1.8*  PROT 7.6  ALBUMIN 4.1   No results for input(s): LIPASE, AMYLASE in the last 168 hours. No results for input(s): AMMONIA in the last 168 hours. Coagulation Profile: No results for input(s): INR, PROTIME in the last 168 hours. Cardiac Enzymes: No results for input(s): CKTOTAL, CKMB, CKMBINDEX, TROPONINI in the last 168 hours. BNP (last 3 results) No results for input(s): PROBNP in the last 8760 hours. HbA1C: No results for input(s): HGBA1C in the last 72 hours. CBG: No results for input(s): GLUCAP in the last 168 hours. Lipid Profile: No results for input(s): CHOL, HDL, LDLCALC, TRIG, CHOLHDL, LDLDIRECT in the last 72 hours. Thyroid Function Tests: No results for input(s): TSH, T4TOTAL, FREET4, T3FREE, THYROIDAB in the last 72 hours. Anemia Panel:  Recent Labs  12/26/15 2307  RETICCTPCT 7.6*   Urine analysis:    Component Value Date/Time   COLORURINE YELLOW 11/08/2015 2046   APPEARANCEUR CLEAR 11/08/2015 2046   APPEARANCEUR Clear 05/06/2012 1546   LABSPEC 1.015 11/08/2015 2046   LABSPEC 1.011 05/06/2012 1546   PHURINE 6.5 11/08/2015 2046   GLUCOSEU NEGATIVE 11/08/2015 2046   GLUCOSEU  Negative 05/06/2012 1546   HGBUR MODERATE (A) 11/08/2015 2046   HGBUR negative 01/08/2009 1329   BILIRUBINUR NEGATIVE 11/08/2015 2046   BILIRUBINUR Negative 05/06/2012 [place] 11/08/2015 2046   PROTEINUR NEGATIVE 11/08/2015 2046   UROBILINOGEN 2.0 (H) 10/26/2015 0817   NITRITE NEGATIVE 11/08/2015 2046   LEUKOCYTESUR TRACE (A) 11/08/2015 2046   LEUKOCYTESUR Negative 05/06/2012 1546   Sepsis Labs: @LABRCNTIP (procalcitonin:4,lacticidven:4) )No results found for this or any previous visit (from the past 240 hour(s)).   Radiological Exams on Admission: No results found.   EKG: Not done in ED, will get one.   Assessment/Plan Principal Problem:   Sickle cell pain crisis ([name]) Active Problems:   Migraines   Anemia of chronic disease   Hb-SS disease with crisis ([name])   Nausea & vomiting   Sickle cell pain crisis and Hb-SS disease: No CP, SOB. Oxygen saturation 100% on room air. Hemoglobin dropped by 2 g, hemodynamically stable currently. No need for blood transfusion now.  -Will place on Med-surg bed for obs. -Hold transfusion now -continue hydroxyurea -continue home Ibuprofen and Methadone -Benadryl for itch -IVF: 1LNS in ED, then D5-1/2NS at 150 cc/h -Zofran for nausea -Check LDH -Start high dose PCA protocol for pain: loading dose 1 mg, bolus dose 0.7 mg, lockout interval 10 min and one hour dose limit at 4.2 mg.  This is based FYI recommendation by Dr. Rodman Key  Migraines: -prn Excedrin  Nausea & vomiting: Most likely due to severe pain. No diarrhea. Abdominal pain has resolved. -When necessary Zofran for nausea -Check lipase  Leukocytosis: no signs of infection. Likely due to stress induced to demargination. -follow up by CBC  DVT ppx: SQ Lovenox Code Status: Full code Family Communication: None at bed side. Disposition Plan:  Anticipate discharge back to previous home environment Consults called:  none Admission status:   medical floor/obs     Date of Service 12/27/2015    Ivor Costa [facility] Pager [phone]  If 7PM-7AM, please contact night-coverage [email or web address] Password [email or web address] 12/27/2015, 4:07 AM

## 2015-12-27 NOTE — ED Notes (Signed)
Nasal Cannula applied at 3L; pt's O2 improved to 94%

## 2015-12-28 DIAGNOSIS — D5700 Hb-SS disease with crisis, unspecified: Principal | ICD-10-CM

## 2015-12-28 LAB — CBC
HEMATOCRIT: 19.5 % — AB (ref 36.0–46.0)
HEMOGLOBIN: 6.8 g/dL — AB (ref 12.0–15.0)
MCH: 28.5 pg (ref 26.0–34.0)
MCHC: 34.9 g/dL (ref 30.0–36.0)
MCV: 81.6 fL (ref 78.0–100.0)
Platelets: 554 10*3/uL — ABNORMAL HIGH (ref 150–400)
RBC: 2.39 MIL/uL — ABNORMAL LOW (ref 3.87–5.11)
RDW: 17.7 % — ABNORMAL HIGH (ref 11.5–15.5)
WBC: 14.8 10*3/uL — ABNORMAL HIGH (ref 4.0–10.5)

## 2015-12-28 NOTE — Progress Notes (Signed)
RNs walked into room for bedside report and saw IVF going at 999/hr.  Pt then tried to quickly change rate back to 150.  RN educated patient and emphasized that she is not to manipulate pump.  IV pump locked by RN.

## 2015-12-28 NOTE — Progress Notes (Signed)
Subjective: A 25 year old female well known to our service admitted yesterday with sickle cell painful crisis. Patient just recently had a child birth. She is on Dilaudid PCA with Toradol and physician assistant  Dosing of 2 mg  Every 3 hours.  She still complains of 8 out of 10 pain  In her back and legs. Pain is particularly in the area she had epidural.  Objective: Vital signs in last 24 hours: Temp:  [98.1 F (36.7 C)-98.8 F (37.1 C)] 98.4 F (36.9 C) (08/14 0620) Pulse Rate:  [90-103] 99 (08/14 0620) Resp:  [11-18] 18 (08/14 0620) BP: (105-125)/(58-87) 112/87 (08/14 0620) SpO2:  [92 %-100 %] 96 % (08/14 0620) FiO2 (%):  [21 %] 21 % (08/13 1600) Weight:  [81.4 kg (179 lb 7.3 oz)] 81.4 kg (179 lb 7.3 oz) (08/14 0620) Weight change: 5.649 kg (12 lb 7.3 oz) Last BM Date: 12/28/15  Intake/Output from previous day: 08/13 0701 - 08/14 0700 In: 2386 [P.O.:720; I.V.:1666] Out: 2600 [Urine:2600] Intake/Output this shift: No intake/output data recorded.  General appearance: alert, cooperative and no distress Neck: no adenopathy, no carotid bruit, no JVD, supple, symmetrical, trachea midline and thyroid not enlarged, symmetric, no tenderness/mass/nodules Back: symmetric, no curvature. ROM normal. No CVA tenderness. Resp: clear to auscultation bilaterally Cardio: regular rate and rhythm, S1, S2 normal, no murmur, click, rub or gallop GI: soft, non-tender; bowel sounds normal; no masses,  no organomegaly Extremities: extremities normal, atraumatic, no cyanosis or edema Pulses: 2+ and symmetric Skin: Skin color, texture, turgor normal. No rashes or lesions Neurologic: Grossly normal  Lab Results:  Recent Labs  12/26/15 2307  WBC 17.1*  HGB 8.0*  HCT 23.5*  PLT 654*   BMET  Recent Labs  12/26/15 2307  NA 137  K 3.5  CL 106  CO2 24  GLUCOSE 117*  BUN 11  CREATININE 0.66  CALCIUM 8.9    Studies/Results: No results found.  Medications: I have reviewed the patient's  current medications.  Assessment/Plan: A 25 year old female admitted with sickle cell painful crisis.  #1 sickle cell painful crisis:  Patient seems to be doing  Better on current regimen. I will continue his current Dilaudid PCA dose and Physician assisted dosing.  Continue Toradol and monitor patient's response. If no response overnight I will follow the change  Pravastatin. #2 anemia of chronic disease: Recheck labs for hemoglobin. No evidence of hemolytic crisis as of yesterday. #3 leukocytosis: Most likely due to vaso-occlusive crisis.  Follow white count. #4 nausea was vomiting: Patient has not had any nausea vomiting since admission. Diet has been addressed.  LOS: 1 day   GARBA,LAWAL 12/28/2015, 7:23 AM

## 2015-12-28 NOTE — Telephone Encounter (Signed)
Refill request for oxycodone. Please advise. Thanks!  

## 2015-12-29 ENCOUNTER — Other Ambulatory Visit: Payer: Self-pay | Admitting: Internal Medicine

## 2015-12-29 DIAGNOSIS — D638 Anemia in other chronic diseases classified elsewhere: Secondary | ICD-10-CM

## 2015-12-29 DIAGNOSIS — D571 Sickle-cell disease without crisis: Secondary | ICD-10-CM

## 2015-12-29 LAB — COMPREHENSIVE METABOLIC PANEL
ALT: 12 U/L — ABNORMAL LOW (ref 14–54)
AST: 22 U/L (ref 15–41)
Albumin: 3.9 g/dL (ref 3.5–5.0)
Alkaline Phosphatase: 55 U/L (ref 38–126)
Anion gap: 3 — ABNORMAL LOW (ref 5–15)
BILIRUBIN TOTAL: 2 mg/dL — AB (ref 0.3–1.2)
BUN: 8 mg/dL (ref 6–20)
CHLORIDE: 110 mmol/L (ref 101–111)
CO2: 25 mmol/L (ref 22–32)
CREATININE: 0.72 mg/dL (ref 0.44–1.00)
Calcium: 8.5 mg/dL — ABNORMAL LOW (ref 8.9–10.3)
GFR calc Af Amer: >60 mL/min (ref >60)
GLUCOSE: 97 mg/dL (ref 65–99)
Potassium: 3.9 mmol/L (ref 3.5–5.1)
Sodium: 138 mmol/L (ref 135–145)
Total Protein: 6.6 g/dL (ref 6.5–8.1)

## 2015-12-29 LAB — CBC WITH DIFFERENTIAL/PLATELET
Basophils Absolute: 0 10*3/uL (ref 0.0–0.1)
Basophils Relative: 0 %
EOS PCT: 8 %
Eosinophils Absolute: 1.1 10*3/uL — ABNORMAL HIGH (ref 0.0–0.7)
HEMATOCRIT: 18.8 % — AB (ref 36.0–46.0)
Hemoglobin: 6.6 g/dL — CL (ref 12.0–15.0)
LYMPHS ABS: 3.1 10*3/uL (ref 0.7–4.0)
Lymphocytes Relative: 22 %
MCH: 28.6 pg (ref 26.0–34.0)
MCHC: 35.1 g/dL (ref 30.0–36.0)
MCV: 81.4 fL (ref 78.0–100.0)
MONOS PCT: 11 %
Monocytes Absolute: 1.5 10*3/uL — ABNORMAL HIGH (ref 0.1–1.0)
NEUTROS ABS: 8.3 10*3/uL — AB (ref 1.7–7.7)
Neutrophils Relative %: 59 %
Platelets: 509 10*3/uL — ABNORMAL HIGH (ref 150–400)
RBC: 2.31 MIL/uL — ABNORMAL LOW (ref 3.87–5.11)
RDW: 19 % — AB (ref 11.5–15.5)
WBC: 14 10*3/uL — ABNORMAL HIGH (ref 4.0–10.5)

## 2015-12-29 MED ORDER — HEPARIN SOD (PORK) LOCK FLUSH 100 UNIT/ML IV SOLN
500.0000 [IU] | Freq: Once | INTRAVENOUS | Status: DC
  Filled 2015-12-29: qty 5

## 2015-12-29 MED ORDER — SODIUM CHLORIDE 0.9% FLUSH: 10.0000 mL | Freq: Two times a day (BID) | INTRAVENOUS | Status: DC

## 2015-12-29 MED ORDER — OXYCODONE HCL 15 MG PO TABS: 15.0000 mg | ORAL_TABLET | ORAL | 0 refills | Status: DC | PRN

## 2015-12-29 MED ORDER — SODIUM CHLORIDE 0.9% FLUSH: 10.0000 mL | INTRAVENOUS | Status: DC | PRN

## 2015-12-29 MED FILL — oxyCODONE HCL 15 MG TABS: 15 | 15 days supply | Qty: 90 | Fill #0

## 2015-12-29 NOTE — Care Management Note (Signed)
Case Management Note  Patient Details  Name: [name] MRN: [id number] Date of Birth: 15-Nov-1990  Subjective/Objective:      25 yo admitted with [facility]              Action/Plan: From home with children. Chart reviewed and CM following for DC needs.  Expected Discharge Date:                  Expected Discharge Plan:  Home/Self Care  In-House Referral:     Discharge planning Services  CM Consult  Post Acute Care Choice:    Choice offered to:     DME Arranged:    DME Agency:     HH Arranged:    HH Agency:     Status of Service:  In process, will continue to follow  If discussed at Long Length of Stay Meetings, dates discussed:    Additional CommentsLynnell Catalan, RN 12/29/2015, 2:15 PM  [phone]

## 2015-12-29 NOTE — Discharge Summary (Signed)
Physician Discharge Summary  [name] [id number] DOB: 11-Nov-1990 DOA: 12/26/2015  PCP: Angelica Chessman, MD  Admit date: 12/26/2015  Discharge date: 12/29/2015  Discharge Diagnoses:  Principal Problem:   Sickle cell pain crisis ([place]) Active Problems:   Migraines   Anemia of chronic disease   Hb-SS disease with crisis ([place])   Sickle cell crisis ([name])   Nausea & vomiting   Discharge Condition: Stable  Disposition:  Follow-up Information    Leeum Sankey, MD Follow up in 2 week(s).   Specialty:  Internal Medicine Contact information: [place] [id number] [phone]           Diet: Regular  Wt Readings from Last 3 Encounters:  12/29/15 188 lb 0.8 oz (85.3 kg)  12/23/15 167 lb (75.8 kg)  12/15/15 167 lb (75.8 kg)   History of present illness:   [name] is a 25 y.o. female with medical history significant of sickle cell disease, migraine headache, GERD, depression, who presents with beck pain, nausea and vomiting. Patient states that she started having sudden onset back pain last night. Initially her back pain is in lower back, but now involving the whole back, worse in lower back. The pain is constant, 10 out of 10 in severity, nonradiating. It is not aggravated or alleviated by any known factors. She states her symptoms are similar to previous pains associated with sickle cell. No leg numbness or weakness. No incontinence of bladder or bowel movement. She has nausea and vomited twice without blood in the vomitus. She had mild abdominal pain, which has resolved. No diarrhea. Patient denies chest pain, cough, shortness breath, fever or chills. No unilateral weakness.  ED Course: Pt was found to have hemoglobin 10.0 on 12/15/15-->8.0 today, WBC 17.1, temperature normal, slightly tachycardia, no tachypnea, O2 sat 100% on RA, electrolytes renal function okay. Patient is placed on MedSurg bed for further eval and  treatment  Hospital Course:  Patient was admitted for sickle cell pain crisis and managed with appropriate protocol, IVF, Dilaudid PCA and other adjunct therapies. She improved gradually and as at today doing very well, excited about her birthday coming up tomorrow, wants to go home to celebrate. She said her pain is at baseline. There was a report of patient manipulating her IV fluid rate and tampering with the PCA pump setting during this admission. She has had this behavior repeatedly in previous admissions as well. Patient was counseled extensively. At the time of discharge, she was hemodynamically stable and pain at baseline. She will follow up with me at the sickle cell clinic.   Discharge Exam: Vitals:   12/29/15 1000 12/29/15 1156  BP: 119/83   Pulse: (!) 112   Resp: 18 18  Temp: 98.4 F (36.9 C)    Vitals:   12/29/15 0640 12/29/15 0800 12/29/15 1000 12/29/15 1156  BP: 118/70  119/83   Pulse: 99  (!) 112   Resp: 16 18 18 18   Temp: 99 F (37.2 C)  98.4 F (36.9 C)   TempSrc: Oral  Oral   SpO2: 100% 94% 96% 96%  Weight: 188 lb 0.8 oz (85.3 kg)     Height:        General appearance : Awake, alert, not in any distress. Speech Clear. Not toxic looking HEENT: Atraumatic and Normocephalic, pupils equally reactive to light and accomodation Neck: Supple, no JVD. No cervical lymphadenopathy.  Chest: Good air entry bilaterally, no added sounds  CVS: S1 S2 regular,  no murmurs.  Abdomen: Bowel sounds present, Non tender and not distended with no gaurding, rigidity or rebound. Extremities: B/L Lower Ext shows no edema, both legs are warm to touch Neurology: Awake alert, and oriented X 3, CN II-XII intact, Non focal Skin: No Rash  Discharge Instructions  Discharge Instructions    Diet - low sodium heart healthy    Complete by:  As directed   Increase activity slowly    Complete by:  As directed       Medication List    TAKE these medications   aspirin-acetaminophen-caffeine  250-250-65 MG tablet Commonly known as:  EXCEDRIN MIGRAINE Take 1 tablet by mouth every 6 (six) hours as needed for headache.   calcium carbonate 500 MG chewable tablet Commonly known as:  TUMS - dosed in mg elemental calcium Chew 2 tablets by mouth 2 (two) times daily as needed for indigestion or heartburn.   hydroxyurea 500 MG capsule Commonly known as:  HYDREA Take 1 capsule (500 mg total) by mouth daily. May take with food to minimize GI side effects.   ibuprofen 600 MG tablet Commonly known as:  ADVIL,MOTRIN Take 1 tablet (600 mg total) by mouth every 6 (six) hours.   methadone 5 MG tablet Commonly known as:  DOLOPHINE Take 1 tablet (5 mg total) by mouth daily.   oxyCODONE 15 MG immediate release tablet Commonly known as:  ROXICODONE Take 1 tablet (15 mg total) by mouth every 4 (four) hours as needed for pain. Start taking on:  12/30/2015   valACYclovir 500 MG tablet Commonly known as:  VALTREX Take 1,000 mg by mouth daily as needed (for flares.).        The results of significant diagnostics from this hospitalization (including imaging, microbiology, ancillary and laboratory) are listed below for reference.    Significant Diagnostic Studies: Dg Chest 2 View  Result Date: 12/08/2015 CLINICAL DATA:  Sickle cell crisis.  Fever for 2 days. EXAM: CHEST  2 VIEW COMPARISON:  Chest x-ray 11/08/2015. FINDINGS: The heart size and mediastinal contours are within normal limits. Both lungs are clear. 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 IMPRESSION: No active cardiopulmonary disease. Electronically Signed   By: Misty Stanley M.D.   On: 12/08/2015 21:50  Ir Fluoro Guide Cv Line Right  Result Date: 12/23/2015 INDICATION: 25 year old female with sickle cell disease and very poor venous access. She is here for placement of a port catheter to provide durable venous access. EXAM: IMPLANTED PORT A CATH PLACEMENT WITH ULTRASOUND AND FLUOROSCOPIC  GUIDANCE MEDICATIONS: 2 g Ancef; The antibiotic was administered within an appropriate time interval prior to skin puncture. ANESTHESIA/SEDATION: Versed 5 mg IV; Fentanyl 75 mcg IV; Moderate Sedation Time:  28 minutes The patient's level of consciousness and vital signs were continuously monitored during the procedure by the interventional radiology nurse under my direct supervision. FLUOROSCOPY TIME:  0 minutes, 12 seconds (1.7 mGy) COMPLICATIONS: None immediate. PROCEDURE: The right neck and chest was prepped with chlorhexidine, and draped in the usual sterile fashion using maximum barrier technique (cap and mask, sterile gown, sterile gloves, large sterile sheet, hand hygiene and cutaneous antiseptic). Antibiotic prophylaxis was provided with 2g Ancef administered IV one hour prior to skin incision. Local anesthesia was attained by infiltration with 1% lidocaine with epinephrine. Ultrasound demonstrated patency of the right internal jugular vein, and this was documented with an image. Under real-time ultrasound guidance, this vein was accessed with a 21 gauge micropuncture needle and image documentation was  performed. A small dermatotomy was made at the access site with an 11 scalpel. A 0.018" wire was advanced into the SVC and the access needle exchanged for a 82F micropuncture vascular sheath. The 0.018" wire was then removed and a 0.035" wire advanced into the IVC. An appropriate location for the subcutaneous reservoir was selected below the clavicle and an incision was made through the skin and underlying soft tissues. The subcutaneous tissues were then dissected using a combination of blunt and sharp surgical technique and a pocket was formed. A single lumen power injectable portacatheter was then tunneled through the subcutaneous tissues from the pocket to the dermatotomy and the port reservoir placed within the subcutaneous pocket. The venous access site was then serially dilated and a peel away vascular  sheath placed over the wire. The wire was removed and the port catheter advanced into position under fluoroscopic guidance. The catheter tip is positioned in the upper right atrium. This was documented with a spot image. The portacatheter was then tested and found to flush and aspirate well. The port was flushed with saline followed by 100 units/mL heparinized saline. The pocket was then closed in two layers using first subdermal inverted interrupted absorbable sutures followed by a running subcuticular suture. The epidermis was then sealed with Dermabond. The dermatotomy at the venous access site was also sealed with Dermabond. IMPRESSION: Successful placement of a right IJ approach Power Port with ultrasound and fluoroscopic guidance. The catheter is ready for use. Signed, Criselda Peaches, MD [facility] Radiology Electronically Signed   By: Jacqulynn Cadet M.D.   On: 12/23/2015 15:29   Ir US Guide Vasc Access Right  Result Date: 12/23/2015 INDICATION: 25 year old female with sickle cell disease and very poor venous access. She is here for placement of a port catheter to provide durable venous access. EXAM: IMPLANTED PORT A CATH PLACEMENT WITH ULTRASOUND AND FLUOROSCOPIC GUIDANCE MEDICATIONS: 2 g Ancef; The antibiotic was administered within an appropriate time interval prior to skin puncture. ANESTHESIA/SEDATION: Versed 5 mg IV; Fentanyl 75 mcg IV; Moderate Sedation Time:  28 minutes The patient's level of consciousness and vital signs were continuously monitored during the procedure by the interventional radiology nurse under my direct supervision. FLUOROSCOPY TIME:  0 minutes, 12 seconds (1.7 mGy) COMPLICATIONS: None immediate. PROCEDURE: The right neck and chest was prepped with chlorhexidine, and draped in the usual sterile fashion using maximum barrier technique (cap and mask, sterile gown, sterile gloves, large sterile sheet, hand hygiene and  cutaneous antiseptic). Antibiotic prophylaxis was provided with 2g Ancef administered IV one hour prior to skin incision. Local anesthesia was attained by infiltration with 1% lidocaine with epinephrine. Ultrasound demonstrated patency of the right internal jugular vein, and this was documented with an image. Under real-time ultrasound guidance, this vein was accessed with a 21 gauge micropuncture needle and image documentation was performed. A small dermatotomy was made at the access site with an 11 scalpel. A 0.018" wire was advanced into the SVC and the access needle exchanged for a 82F micropuncture vascular sheath. The 0.018" wire was then removed and a 0.035" wire advanced into the IVC. An appropriate location for the subcutaneous reservoir was selected below the clavicle and an incision was made through the skin and underlying soft tissues. The subcutaneous tissues were then dissected using a combination of blunt and sharp surgical technique and a pocket was formed. A single lumen power injectable portacatheter was then tunneled through the subcutaneous tissues from the pocket  to the dermatotomy and the port reservoir placed within the subcutaneous pocket. The venous access site was then serially dilated and a peel away vascular sheath placed over the wire. The wire was removed and the port catheter advanced into position under fluoroscopic guidance. The catheter tip is positioned in the upper right atrium. This was documented with a spot image. The portacatheter was then tested and found to flush and aspirate well. The port was flushed with saline followed by 100 units/mL heparinized saline. The pocket was then closed in two layers using first subdermal inverted interrupted absorbable sutures followed by a running subcuticular suture. The epidermis was then sealed with Dermabond. The dermatotomy at the venous access site was also sealed with Dermabond. IMPRESSION: Successful placement of a right IJ approach  Power Port with ultrasound and fluoroscopic guidance. The catheter is ready for use. Signed, Criselda Peaches, MD [facility] Radiology Electronically Signed   By: Jacqulynn Cadet M.D.   On: 12/23/2015 15:29    Microbiology: No results found for this or any previous visit (from the past 240 hour(s)).   Labs: Basic Metabolic Panel:  Recent Labs Lab 12/26/15 2307 12/29/15 0500  NA 137 138  K 3.5 3.9  CL 106 110  CO2 24 25  GLUCOSE 117* 97  BUN 11 8  CREATININE 0.66 0.72  CALCIUM 8.9 8.5*   Liver Function Tests:  Recent Labs Lab 12/26/15 2307 12/29/15 0500  AST 20 22  ALT 12* 12*  ALKPHOS 58 55  BILITOT 1.8* 2.0*  PROT 7.6 6.6  ALBUMIN 4.1 3.9    Recent Labs Lab 12/26/15 2307  LIPASE 25   No results for input(s): AMMONIA in the last 168 hours. CBC:  Recent Labs Lab 12/26/15 2307 12/28/15 0855 12/29/15 0500  WBC 17.1* 14.8* 14.0*  NEUTROABS 12.0*  --  8.3*  HGB 8.0* 6.8* 6.6*  HCT 23.5* 19.5* 18.8*  MCV 82.7 81.6 81.4  PLT 654* 554* 509*   Cardiac Enzymes: No results for input(s): CKTOTAL, CKMB, CKMBINDEX, TROPONINI in the last 168 hours. BNP: Invalid input(s): POCBNP CBG: No results for input(s): GLUCAP in the last 168 hours.  Time coordinating discharge: 50 minutes  Signed:  Lalla Laham, [facility] 12/29/2015, 2:20 PM

## 2015-12-31 ENCOUNTER — Ambulatory Visit: Payer: Medicare Other | Admitting: Family

## 2016-01-03 ENCOUNTER — Encounter (HOSPITAL_COMMUNITY): Payer: Self-pay | Admitting: Emergency Medicine

## 2016-01-03 ENCOUNTER — Emergency Department (HOSPITAL_COMMUNITY): Payer: Medicare Other

## 2016-01-03 ENCOUNTER — Emergency Department (HOSPITAL_COMMUNITY)
Admission: EM | Admit: 2016-01-03 | Discharge: 2016-01-03 | Disposition: A | Discharge status: Home / Self Care | Payer: Medicare Other | Source: Home / Self Care | Attending: Emergency Medicine | Admitting: Emergency Medicine

## 2016-01-03 DIAGNOSIS — Z87891 Personal history of nicotine dependence: Secondary | ICD-10-CM

## 2016-01-03 DIAGNOSIS — D72829 Elevated white blood cell count, unspecified: Secondary | ICD-10-CM | POA: Diagnosis present

## 2016-01-03 DIAGNOSIS — Z91018 Allergy to other foods: Secondary | ICD-10-CM | POA: Diagnosis not present

## 2016-01-03 DIAGNOSIS — Z91040 Latex allergy status: Secondary | ICD-10-CM

## 2016-01-03 DIAGNOSIS — R05 Cough: Secondary | ICD-10-CM | POA: Insufficient documentation

## 2016-01-03 DIAGNOSIS — Z7982 Long term (current) use of aspirin: Secondary | ICD-10-CM

## 2016-01-03 DIAGNOSIS — Z8249 Family history of ischemic heart disease and other diseases of the circulatory system: Secondary | ICD-10-CM | POA: Diagnosis not present

## 2016-01-03 DIAGNOSIS — M62830 Muscle spasm of back: Secondary | ICD-10-CM | POA: Diagnosis not present

## 2016-01-03 DIAGNOSIS — F1190 Opioid use, unspecified, uncomplicated: Secondary | ICD-10-CM

## 2016-01-03 DIAGNOSIS — Z79899 Other long term (current) drug therapy: Secondary | ICD-10-CM

## 2016-01-03 DIAGNOSIS — M549 Dorsalgia, unspecified: Secondary | ICD-10-CM | POA: Diagnosis present

## 2016-01-03 DIAGNOSIS — G894 Chronic pain syndrome: Secondary | ICD-10-CM | POA: Diagnosis present

## 2016-01-03 DIAGNOSIS — G43909 Migraine, unspecified, not intractable, without status migrainosus: Secondary | ICD-10-CM | POA: Diagnosis not present

## 2016-01-03 DIAGNOSIS — D5700 Hb-SS disease with crisis, unspecified: Secondary | ICD-10-CM

## 2016-01-03 DIAGNOSIS — Z833 Family history of diabetes mellitus: Secondary | ICD-10-CM

## 2016-01-03 DIAGNOSIS — Q8901 Asplenia (congenital): Secondary | ICD-10-CM | POA: Diagnosis not present

## 2016-01-03 DIAGNOSIS — D7589 Other specified diseases of blood and blood-forming organs: Secondary | ICD-10-CM | POA: Diagnosis not present

## 2016-01-03 DIAGNOSIS — A6000 Herpesviral infection of urogenital system, unspecified: Secondary | ICD-10-CM | POA: Diagnosis not present

## 2016-01-03 DIAGNOSIS — D473 Essential (hemorrhagic) thrombocythemia: Secondary | ICD-10-CM | POA: Diagnosis not present

## 2016-01-03 DIAGNOSIS — D571 Sickle-cell disease without crisis: Secondary | ICD-10-CM | POA: Diagnosis not present

## 2016-01-03 LAB — COMPREHENSIVE METABOLIC PANEL
ALBUMIN: 4.7 g/dL (ref 3.5–5.0)
ALK PHOS: 52 U/L (ref 38–126)
ALT: 15 U/L (ref 14–54)
ANION GAP: 5 (ref 5–15)
AST: 18 U/L (ref 15–41)
BILIRUBIN TOTAL: 1.9 mg/dL — AB (ref 0.3–1.2)
BUN: 11 mg/dL (ref 6–20)
CALCIUM: 9.4 mg/dL (ref 8.9–10.3)
CO2: 23 mmol/L (ref 22–32)
Chloride: 111 mmol/L (ref 101–111)
Creatinine, Ser: 0.51 mg/dL (ref 0.44–1.00)
GFR calc non Af Amer: >60 mL/min (ref >60)
GLUCOSE: 91 mg/dL (ref 65–99)
POTASSIUM: 4 mmol/L (ref 3.5–5.1)
SODIUM: 139 mmol/L (ref 135–145)
TOTAL PROTEIN: 7.6 g/dL (ref 6.5–8.1)

## 2016-01-03 LAB — TYPE AND SCREEN
ABO/RH(D): B POS
ANTIBODY SCREEN: NEGATIVE

## 2016-01-03 LAB — CBC WITH DIFFERENTIAL/PLATELET
BASOS ABS: 0 10*3/uL (ref 0.0–0.1)
Basophils Relative: 0 %
EOS ABS: 0.5 10*3/uL (ref 0.0–0.7)
Eosinophils Relative: 4 %
HEMATOCRIT: 25.2 % — AB (ref 36.0–46.0)
HEMOGLOBIN: 8.6 g/dL — AB (ref 12.0–15.0)
LYMPHS PCT: 7 %
Lymphs Abs: 0.8 10*3/uL (ref 0.7–4.0)
MCH: 29.9 pg (ref 26.0–34.0)
MCHC: 34.1 g/dL (ref 30.0–36.0)
MCV: 87.5 fL (ref 78.0–100.0)
MONOS PCT: 6 %
Monocytes Absolute: 0.7 10*3/uL (ref 0.1–1.0)
NEUTROS ABS: 9.9 10*3/uL — AB (ref 1.7–7.7)
NEUTROS PCT: 83 %
Platelets: 543 10*3/uL — ABNORMAL HIGH (ref 150–400)
RBC: 2.88 MIL/uL — AB (ref 3.87–5.11)
RDW: 22.4 % — ABNORMAL HIGH (ref 11.5–15.5)
WBC: 11.9 10*3/uL — ABNORMAL HIGH (ref 4.0–10.5)

## 2016-01-03 LAB — RETICULOCYTES
RBC.: 2.88 MIL/uL — AB (ref 3.87–5.11)
RETIC COUNT ABSOLUTE: 616.3 10*3/uL — AB (ref 19.0–186.0)
RETIC CT PCT: 21.4 % — AB (ref 0.4–3.1)

## 2016-01-03 MED ORDER — DIPHENHYDRAMINE HCL 50 MG/ML IJ SOLN
25.0000 mg | Freq: Once | INTRAMUSCULAR | Status: AC
  Administered 2016-01-03: 25 mg via INTRAVENOUS
  Filled 2016-01-03: qty 1

## 2016-01-03 MED ORDER — HYDROMORPHONE HCL 2 MG/ML IJ SOLN: 0.0250 mg/kg | INTRAMUSCULAR | Status: AC

## 2016-01-03 MED ORDER — DIPHENHYDRAMINE HCL 25 MG PO CAPS
25.0000 mg | ORAL_CAPSULE | Freq: Once | ORAL | Status: AC
  Administered 2016-01-03: 25 mg via ORAL
  Filled 2016-01-03: qty 1

## 2016-01-03 MED ORDER — HYDROMORPHONE HCL 2 MG/ML IJ SOLN
0.0375 mg/kg | INTRAMUSCULAR | Status: AC
  Administered 2016-01-03: 3.2 mg via INTRAVENOUS
  Filled 2016-01-03: qty 2

## 2016-01-03 MED ORDER — ONDANSETRON HCL 4 MG/2ML IJ SOLN
4.0000 mg | INTRAMUSCULAR | Status: DC | PRN
  Administered 2016-01-03: 4 mg via INTRAVENOUS
  Filled 2016-01-03: qty 2

## 2016-01-03 MED ORDER — DEXTROSE-NACL 5-0.45 % IV SOLN: INTRAVENOUS | Status: DC

## 2016-01-03 MED ORDER — HYDROMORPHONE HCL 2 MG/ML IJ SOLN: 0.0313 mg/kg | INTRAMUSCULAR | Status: AC

## 2016-01-03 MED ORDER — HYDROMORPHONE HCL 2 MG/ML IJ SOLN
0.0250 mg/kg | INTRAMUSCULAR | Status: AC
  Administered 2016-01-03: 2.1 mg via INTRAVENOUS
  Filled 2016-01-03: qty 2

## 2016-01-03 MED ORDER — HYDROMORPHONE HCL 2 MG/ML IJ SOLN
0.0313 mg/kg | INTRAMUSCULAR | Status: AC
  Administered 2016-01-03: 2.7 mg via INTRAVENOUS
  Filled 2016-01-03: qty 2

## 2016-01-03 MED ORDER — HEPARIN SOD (PORK) LOCK FLUSH 100 UNIT/ML IV SOLN
500.0000 [IU] | Freq: Once | INTRAVENOUS | Status: AC
  Administered 2016-01-03: 500 [IU]
  Filled 2016-01-03: qty 5

## 2016-01-03 MED ORDER — SODIUM CHLORIDE 0.9 % IV BOLUS (SEPSIS)
1000.0000 mL | Freq: Once | INTRAVENOUS | Status: AC
  Administered 2016-01-03: 1000 mL via INTRAVENOUS

## 2016-01-03 MED ORDER — HYDROMORPHONE HCL 2 MG/ML IJ SOLN: 0.0375 mg/kg | INTRAMUSCULAR | Status: AC

## 2016-01-03 NOTE — ED Notes (Signed)
PA at bedside.

## 2016-01-03 NOTE — ED Notes (Signed)
Pt given apple juice and graham crackers x 6 packages, per request.

## 2016-01-03 NOTE — ED Notes (Signed)
Pt reports that she feels "much better," but pain score has not decreased from 7/10.

## 2016-01-03 NOTE — ED Provider Notes (Signed)
[facility] DEPT Provider Note   CSN: [id number] Arrival date & time: 01/03/16  1055     History   Chief Complaint Chief Complaint  Patient presents with  . Back Pain  . Sickle Cell Pain Crisis    HPI [name] is a 25 y.o. female.  [name] is a 25 y.o. female with history of SCD, depression, GERD presents to ED with complaint of back pain and sickle cell pain crisis. Patient states she woke up at 12am with diffuse back pain. Pain significant enough to impact her sleep. She took her 15mg  of oxycodone at 4am and her methadone at 9am with minimal relief of pain. Pain is 8-9/10.  No recent trauma. No IVDU, no fever, no h/o cancer, no unexpected weight loss, no urinary retention or incontinence, no bowel incontinence, no saddle anesthesia. She also endorses non-productive cough that started this morning. She denies fever, chest pain, shortness of breath, abdominal pain, N/V, dysuria, hematuria, rash, dizziness, lightheadedness, headache, or syncope. She was recently admitted for intractable pain secondary to sickle cell pain crisis and d/c on 12/29/15. She is followed by Dr. Doreene Burke for her SCD. She take oxycodone, methadone, and hydroxyurea for management of pain.       Past Medical History:  Diagnosis Date  . Blood transfusion    "lots"  . Blood transfusion without reported diagnosis   . Chronic back pain    "very severe; have knot in my back; from tight muscle; take RX and exercise for it"  . Depression 01/06/2011  . Exertional dyspnea    "sometimes"  . Genital HSV   . GERD (gastroesophageal reflux disease) 02/17/2011  . Migraines 11/08/11   "@ least twice/month"  . 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  . Mood swings ([place]) 11/08/11   "I go back and forth; real bad"  . Sickle cell anemia ([name])   . Sickle cell anemia with crisis ([place])   . Trichotillomania    h/o    Patient Active Problem List   Diagnosis Date Noted  . Nausea & vomiting  12/27/2015  . Sickle cell crisis ([place]) 11/09/2015  . Sepsis ([place]) 11/09/2015  . Thrombocytosis ([place]) 11/09/2015  . Vaginal bleeding 11/09/2015  . Hyperbilirubinemia 11/09/2015  . Pyrexia   . Vaginal delivery 10/31/2015  . Hb-SS disease with crisis ([place])   . Sickle cell pain crisis ([place]) 10/26/2015  . Positive GBS test 10/18/2015  . Narcotic dependence ([place]) 10/06/2015  . 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 High risk for intrapartum complications, antepartum 08/12/2015  . Chronic pain 08/04/2015  . Cocaine use 07/24/2015  . Maternal sickle cell anemia affecting pregnancy, antepartum ([place]) 07/14/2015  . Anemia of chronic disease   . 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 Migraines 11/08/2011  . GERD (gastroesophageal reflux disease) 02/17/2011  . Depression 01/06/2011  . Sickle cell disease ([place]) 01/08/2009  . 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 CHOLECYSTECTOMY  05/2010  . DILATION AND CURETTAGE OF UTERUS  02/20/11   S/P miscarriage  . IR GENERIC HISTORICAL  12/23/2015   IR FLUORO GUIDE CV LINE RIGHT 12/23/2015 Jacqulynn Cadet, MD [facility]  . IR GENERIC HISTORICAL  12/23/2015   IR US GUIDE VASC ACCESS RIGHT 12/23/2015 Jacqulynn Cadet, MD [facility]    OB History    Gravida Para Term Preterm AB Living  3 2 2   1 2    SAB TAB Ectopic Multiple Live Births   1     0 2      Obstetric Comments   Miscarried in October 2012 at about 7 weeks       Home Medications    Prior to Admission medications   Medication Sig Start Date End Date Taking? Authorizing Provider  aspirin-acetaminophen-caffeine (EXCEDRIN MIGRAINE) [id number] MG tablet Take 2 tablets by mouth every 6 (six) hours as needed for headache.    Yes Historical Provider, MD  hydroxyurea (HYDREA) 500 MG capsule Take 1 capsule (500 mg total) by mouth daily. May  take with food to minimize GI side effects. 12/15/15  Yes Tresa Garter, MD  ibuprofen (ADVIL,MOTRIN) 600 MG tablet Take 600 mg by mouth every 6 (six) hours as needed for mild pain or moderate pain.   Yes Historical Provider, MD  methadone (DOLOPHINE) 5 MG tablet Take 1 tablet (5 mg total) by mouth daily. 12/21/15  Yes Micheline Chapman, NP  oxyCODONE (ROXICODONE) 15 MG immediate release tablet Take 1 tablet (15 mg total) by mouth every 4 (four) hours as needed for pain. 12/30/15 01/14/16 Yes Tresa Garter, MD  valACYclovir (VALTREX) 500 MG tablet Take 1,000 mg by mouth daily.    Yes Historical Provider, MD    Family History Family History  Problem Relation Age of Onset  . Sickle cell trait Mother   . Sickle cell trait Father   . Diabetes Maternal Grandmother   . Diabetes Paternal Grandmother   . Hypertension Paternal Grandmother   . Diabetes Maternal Grandfather     Social History Social History  Substance Use Topics  . Smoking status: Former Smoker    Packs/day: 0.25    Years: 1.00    Types: Cigarettes    Quit date: 03/25/2013  . Smokeless tobacco: Never Used  . Alcohol use No     Comment: pt states she quit marijuan in May 2013. Rare ETOH, + cigarettes.  She is enrolled in school     Allergies   Food and Latex   Review of Systems Review of Systems  Constitutional: Negative for chills, diaphoresis and fever.  HENT: Negative for trouble swallowing.   Eyes: Negative for visual disturbance.  Respiratory: Positive for cough. Negative for shortness of breath.   Cardiovascular: Negative for chest pain.  Gastrointestinal: Negative for abdominal pain, blood in stool, constipation, diarrhea, nausea and vomiting.  Genitourinary: Negative for dysuria and hematuria.  Musculoskeletal: Positive for back pain. Negative for neck pain.  Skin: Negative for rash.  Neurological: Negative for dizziness, syncope, weakness, light-headedness, numbness and headaches.     Physical  Exam Updated Vital Signs BP 112/81 (BP Location: Right Arm)   Pulse 97   Temp 99.1 F (37.3 C) (Oral)   Resp 14   Ht 5\' 1"  (1.549 m)   LMP 01/03/2016   SpO2 96%   Physical Exam  Constitutional: She appears well-developed and well-nourished. No distress.  HENT:  Head: Normocephalic and atraumatic.  Mouth/Throat: Oropharynx is clear and moist. No oropharyngeal exudate.  Eyes: Conjunctivae and EOM are normal. Pupils are equal, round, and reactive to light. Right eye exhibits no discharge. Left eye exhibits no discharge. No scleral icterus.  Neck: Normal range of motion. Neck supple.  Cardiovascular: Normal rate, regular rhythm, normal heart sounds and intact distal pulses.   No murmur heard. Pulmonary/Chest: Effort normal and breath sounds normal. No respiratory distress. She has no wheezes. She has  no rales.    Abdominal: Soft. Bowel sounds are normal. She exhibits no distension. There is no tenderness. There is no rigidity, no rebound and no guarding.  Musculoskeletal: Normal range of motion. She exhibits no edema.  TTP of C-, T-, L- spine, paraspinal, and paravertebral muscles.   Lymphadenopathy:    She has no cervical adenopathy.  Neurological: She is alert. She is not disoriented. Coordination normal. GCS eye subscore is 4. GCS verbal subscore is 5. GCS motor subscore is 6. She displays no Babinski's sign on the right side. She displays no Babinski's sign on the left side.  Mental Status:  Alert, thought content appropriate, able to give a coherent history. Speech fluent without evidence of aphasia. Able to follow 2 step commands without difficulty.  Cranial Nerves:  II:  Peripheral visual fields grossly normal, pupils equal, round, reactive to light III,IV, VI: ptosis not present, extra-ocular motions intact bilaterally  V,VII: smile symmetric, facial light touch sensation equal VIII: hearing grossly normal to voice  X: uvula elevates symmetrically  XI: bilateral shoulder  shrug symmetric and strong XII: midline tongue extension without fassiculations Motor:  Normal tone. 5/5 in upper and lower extremities bilaterally including strong and equal grip strength and dorsiflexion/plantar flexion Sensory: light touch normal in all extremities. Cerebellar: normal finger-to-nose with bilateral upper extremities Gait: normal gait and balance CV: distal pulses palpable throughout   Skin: Skin is warm and dry. She is not diaphoretic.  Psychiatric: She has a normal mood and affect. Her behavior is normal.     ED Treatments / Results  Labs (all labs ordered are listed, but only abnormal results are displayed) Labs Reviewed  COMPREHENSIVE METABOLIC PANEL - Abnormal; Notable for the following:       Result Value   Total Bilirubin 1.9 (*)    All other components within normal limits  CBC WITH DIFFERENTIAL/PLATELET - Abnormal; Notable for the following:    WBC 11.9 (*)    RBC 2.88 (*)    Hemoglobin 8.6 (*)    HCT 25.2 (*)    RDW 22.4 (*)    Platelets 543 (*)    Neutro Abs 9.9 (*)    All other components within normal limits  RETICULOCYTES - Abnormal; Notable for the following:    Retic Ct Pct 21.4 (*)    RBC. 2.88 (*)    Retic Count, Manual 616.3 (*)    All other components within normal limits  TYPE AND SCREEN    EKG  EKG Interpretation  Date/Time:  Sunday January 03 2016 13:51:24 EDT Ventricular Rate:  89 PR Interval:    QRS Duration: 78 QT Interval:  389 QTC Calculation: 474 R Axis:   79 Text Interpretation:  Sinus rhythm Low voltage, precordial leads Borderline T wave abnormalities No significant change since last tracing Confirmed by YAO  MD, DAVID ([id number]) on 01/03/2016 5:00:09 PM       Radiology Dg Chest 2 View  Result Date: 01/03/2016 CLINICAL DATA:  Cough, back pain, history of sickle cell pain EXAM: CHEST  2 VIEW COMPARISON:  12/08/2015 FINDINGS: Cardiomediastinal silhouette is stable. No acute infiltrate or pleural effusion. No pulmonary  edema. There is right IJ Port-A-Cath with tip in distal SVC. No pneumothorax. Bony thorax is unremarkable. IMPRESSION: No active cardiopulmonary disease. Electronically Signed   By: Lahoma Crocker M.D.   On: 01/03/2016 13:08    Procedures Procedures (including critical care time)  Medications Ordered in ED Medications  ondansetron (ZOFRAN) injection 4 mg (4 mg  Intravenous Given 01/03/16 1313)  HYDROmorphone (DILAUDID) injection 2.1 mg (2.1 mg Intravenous Given 01/03/16 1316)    Or  HYDROmorphone (DILAUDID) injection 2.1 mg ( Subcutaneous See Alternative 01/03/16 1316)  diphenhydrAMINE (BENADRYL) injection 25 mg (25 mg Intravenous Given 01/03/16 1314)  sodium chloride 0.9 % bolus 1,000 mL (0 mLs Intravenous Stopped 01/03/16 1421)  HYDROmorphone (DILAUDID) injection 2.7 mg (2.7 mg Intravenous Given 01/03/16 1418)    Or  HYDROmorphone (DILAUDID) injection 2.7 mg ( Subcutaneous See Alternative 01/03/16 1418)  diphenhydrAMINE (BENADRYL) capsule 25 mg (25 mg Oral Given 01/03/16 1440)  HYDROmorphone (DILAUDID) injection 3.2 mg (3.2 mg Intravenous Given 01/03/16 1544)    Or  HYDROmorphone (DILAUDID) injection 3.2 mg ( Subcutaneous See Alternative 01/03/16 1544)  heparin lock flush 100 unit/mL (500 Units Intracatheter Given 01/03/16 1657)     Initial Impression / Assessment and Plan / ED Course  I have reviewed the triage vital signs and the nursing notes.  Pertinent labs & imaging results that were available during my care of the patient were reviewed by me and considered in my medical decision making (see chart for details).  Clinical Course  Value Comment By Time   On re-evaluation patient endorses improvement in pain to 6/10. Baseline pain is 4/10. States she thinks one more dose of pain medication and she should be okay to go home. Desires to go home.  Roxanna Mew, [place] 08/20 1525  DG Chest 2 View Normal cardiac silhouette. No evidence of consolidation, effusion, or PTX. No free air under  diaphragm.  Roxanna Mew, [place] 08/20 1552   Patient reports pain is 5/10. She is requesting to go home.  Roxanna Mew, [place] 08/20 [id number]  ED EKG reviewed Roxanna Mew, PA-C 08/20 1709    Patient presents to ED with complaint of diffuse back pain, patient states this is typical of her sickle cell pain crisis. Patient is afebrile and non-toxic appearing in NAD. She is resting comfortably in bed. Physical exam remarkable for TTP of spine, paraspinal, and paravertebral muscles.  No recent trauma. No IVDU, no fever, no h/o cancer, no unexpected weight loss, no urinary retention or incontinence, no bowel incontinence, no saddle anesthesia. No focal neurologic deficits. Patient recently d/c on 12/29/15 following sickle cell pain crisis. Will check CBC, CMP, retics. Patient has cough - will order CXR and EKG. IVF, pain medication initiated.   Labs stable compared to previous. CXR negative for acute process. EKG shows no acute changes. On re-evaluation patient reports pain 5/10, she is requesting to go home. Encouraged follow up with Dr. Doreene Burke. Continue home medications as prescribed. Return precautions discussed. Patient voiced understanding and is agreeable.    Final Clinical Impressions(s) / ED Diagnoses   Final diagnoses:  Sickle cell pain crisis [facility])    New Prescriptions Discharge Medication List as of 01/03/2016  4:40 PM       Roxanna Mew, PA-C 01/03/16 [place] Yao, MD 01/03/16 1731

## 2016-01-03 NOTE — ED Triage Notes (Signed)
Patient c/o generalized back pain that started last night. Patient unable to sleep due to the pain.

## 2016-01-03 NOTE — ED Notes (Signed)
Pt c/o back pain.  Sts she has not been hydrating d/t the pain.  Pt reports this is her typical Sickle Cell crisis.

## 2016-01-03 NOTE — Discharge Instructions (Signed)
Read the information below.   Your labs are stable compared to previous. Your chest x-ray did not show any acute abnormality. Take your home medications as prescribed.  Be sure to follow up with Dr. Doreene Burke regarding management of your sickle cell.  You may return to the Emergency Department at any time for worsening condition or any new symptoms that concern you. Return to ED if you develop fever, chest pain, shortness of breath, unable to control pain at home.

## 2016-01-03 NOTE — ED Notes (Signed)
Patient transported to X-ray 

## 2016-01-04 ENCOUNTER — Encounter (HOSPITAL_COMMUNITY): Payer: Self-pay | Admitting: Emergency Medicine

## 2016-01-04 DIAGNOSIS — D5700 Hb-SS disease with crisis, unspecified: Secondary | ICD-10-CM | POA: Diagnosis not present

## 2016-01-04 DIAGNOSIS — D571 Sickle-cell disease without crisis: Secondary | ICD-10-CM | POA: Diagnosis not present

## 2016-01-04 NOTE — ED Triage Notes (Signed)
Pt states that she was here 2 days ago and was d/c hoping she could 'fight off' the pain at home but returns with back pain r/t her sickle cell crisis. States she took oxycodone 15mg  at home PTA without relief. Alert and oriented.

## 2016-01-05 ENCOUNTER — Inpatient Hospital Stay (HOSPITAL_COMMUNITY)
Admission: EM | Admit: 2016-01-05 | Discharge: 2016-01-09 | DRG: 812 | Disposition: A | Discharge status: Home / Self Care | Payer: Medicare Other | Attending: Internal Medicine | Admitting: Internal Medicine

## 2016-01-05 DIAGNOSIS — Q8901 Asplenia (congenital): Secondary | ICD-10-CM | POA: Diagnosis not present

## 2016-01-05 DIAGNOSIS — Z79899 Other long term (current) drug therapy: Secondary | ICD-10-CM | POA: Diagnosis not present

## 2016-01-05 DIAGNOSIS — Z87891 Personal history of nicotine dependence: Secondary | ICD-10-CM | POA: Diagnosis not present

## 2016-01-05 DIAGNOSIS — Z833 Family history of diabetes mellitus: Secondary | ICD-10-CM | POA: Diagnosis not present

## 2016-01-05 DIAGNOSIS — D5700 Hb-SS disease with crisis, unspecified: Secondary | ICD-10-CM | POA: Diagnosis not present

## 2016-01-05 DIAGNOSIS — D7589 Other specified diseases of blood and blood-forming organs: Secondary | ICD-10-CM | POA: Diagnosis not present

## 2016-01-05 DIAGNOSIS — M549 Dorsalgia, unspecified: Secondary | ICD-10-CM | POA: Diagnosis not present

## 2016-01-05 DIAGNOSIS — Z8249 Family history of ischemic heart disease and other diseases of the circulatory system: Secondary | ICD-10-CM | POA: Diagnosis not present

## 2016-01-05 DIAGNOSIS — Z91018 Allergy to other foods: Secondary | ICD-10-CM | POA: Diagnosis not present

## 2016-01-05 DIAGNOSIS — Z7982 Long term (current) use of aspirin: Secondary | ICD-10-CM | POA: Diagnosis not present

## 2016-01-05 DIAGNOSIS — M62830 Muscle spasm of back: Secondary | ICD-10-CM | POA: Diagnosis not present

## 2016-01-05 DIAGNOSIS — A6000 Herpesviral infection of urogenital system, unspecified: Secondary | ICD-10-CM | POA: Diagnosis present

## 2016-01-05 DIAGNOSIS — D473 Essential (hemorrhagic) thrombocythemia: Secondary | ICD-10-CM | POA: Diagnosis present

## 2016-01-05 DIAGNOSIS — D72829 Elevated white blood cell count, unspecified: Secondary | ICD-10-CM | POA: Diagnosis present

## 2016-01-05 DIAGNOSIS — Z91040 Latex allergy status: Secondary | ICD-10-CM | POA: Diagnosis not present

## 2016-01-05 DIAGNOSIS — G43909 Migraine, unspecified, not intractable, without status migrainosus: Secondary | ICD-10-CM | POA: Diagnosis not present

## 2016-01-05 DIAGNOSIS — G894 Chronic pain syndrome: Secondary | ICD-10-CM | POA: Diagnosis present

## 2016-01-05 LAB — BASIC METABOLIC PANEL
Anion gap: 6 (ref 5–15)
BUN: 7 mg/dL (ref 6–20)
CALCIUM: 9.2 mg/dL (ref 8.9–10.3)
CO2: 23 mmol/L (ref 22–32)
CREATININE: 0.58 mg/dL (ref 0.44–1.00)
Chloride: 109 mmol/L (ref 101–111)
GFR calc Af Amer: >60 mL/min (ref >60)
GLUCOSE: 92 mg/dL (ref 65–99)
POTASSIUM: 3.5 mmol/L (ref 3.5–5.1)
Sodium: 138 mmol/L (ref 135–145)

## 2016-01-05 LAB — CBC WITH DIFFERENTIAL/PLATELET
Basophils Absolute: 0 10*3/uL (ref 0.0–0.1)
Basophils Relative: 0 %
EOS ABS: 0.9 10*3/uL — AB (ref 0.0–0.7)
EOS PCT: 6 %
HCT: 23.1 % — ABNORMAL LOW (ref 36.0–46.0)
Hemoglobin: 7.9 g/dL — ABNORMAL LOW (ref 12.0–15.0)
LYMPHS ABS: 2.7 10*3/uL (ref 0.7–4.0)
LYMPHS PCT: 19 %
MCH: 28.9 pg (ref 26.0–34.0)
MCHC: 34.2 g/dL (ref 30.0–36.0)
MCV: 84.6 fL (ref 78.0–100.0)
MONO ABS: 1.5 10*3/uL — AB (ref 0.1–1.0)
MONOS PCT: 11 %
Neutro Abs: 8.8 10*3/uL — ABNORMAL HIGH (ref 1.7–7.7)
Neutrophils Relative %: 64 %
PLATELETS: 440 10*3/uL — AB (ref 150–400)
RBC: 2.73 MIL/uL — ABNORMAL LOW (ref 3.87–5.11)
RDW: 20.4 % — AB (ref 11.5–15.5)
WBC: 14 10*3/uL — ABNORMAL HIGH (ref 4.0–10.5)

## 2016-01-05 LAB — RETICULOCYTES
RBC.: 2.73 MIL/uL — ABNORMAL LOW (ref 3.87–5.11)
Retic Count, Absolute: 442.3 10*3/uL — ABNORMAL HIGH (ref 19.0–186.0)
Retic Ct Pct: 16.2 % — ABNORMAL HIGH (ref 0.4–3.1)

## 2016-01-05 MED ORDER — ONDANSETRON 4 MG PO TBDP
4.0000 mg | ORAL_TABLET | Freq: Once | ORAL | Status: AC
  Administered 2016-01-05: 4 mg via ORAL
  Filled 2016-01-05: qty 1

## 2016-01-05 MED ORDER — HYDROMORPHONE HCL 2 MG/ML IJ SOLN: 0.0375 mg/kg | INTRAMUSCULAR | Status: AC

## 2016-01-05 MED ORDER — ONDANSETRON HCL 4 MG/2ML IJ SOLN
4.0000 mg | Freq: Once | INTRAMUSCULAR | Status: AC
  Administered 2016-01-05: 4 mg via INTRAVENOUS
  Filled 2016-01-05: qty 2

## 2016-01-05 MED ORDER — DEXTROSE-NACL 5-0.45 % IV SOLN
INTRAVENOUS | Status: DC
  Administered 2016-01-06: 1000 mL via INTRAVENOUS
  Administered 2016-01-06 (×2): via INTRAVENOUS
  Administered 2016-01-07: 1000 mL via INTRAVENOUS
  Administered 2016-01-07: 19:00:00 via INTRAVENOUS
  Administered 2016-01-07: 1000 mL via INTRAVENOUS
  Administered 2016-01-08: 21:00:00 via INTRAVENOUS
  Administered 2016-01-08: 1000 mL via INTRAVENOUS
  Administered 2016-01-08 – 2016-01-09 (×3): via INTRAVENOUS

## 2016-01-05 MED ORDER — HYDROMORPHONE HCL 2 MG/ML IJ SOLN
0.0375 mg/kg | INTRAMUSCULAR | Status: AC
  Administered 2016-01-05: 2.8 mg via INTRAVENOUS
  Filled 2016-01-05: qty 2

## 2016-01-05 MED ORDER — SODIUM CHLORIDE 0.9 % IV BOLUS (SEPSIS)
1000.0000 mL | Freq: Once | INTRAVENOUS | Status: AC
  Administered 2016-01-05: 1000 mL via INTRAVENOUS

## 2016-01-05 MED ORDER — ONDANSETRON HCL 4 MG/2ML IJ SOLN
4.0000 mg | Freq: Four times a day (QID) | INTRAMUSCULAR | Status: DC | PRN
  Administered 2016-01-06: 4 mg via INTRAVENOUS
  Filled 2016-01-05: qty 2

## 2016-01-05 MED ORDER — DIPHENHYDRAMINE HCL 25 MG PO CAPS
25.0000 mg | ORAL_CAPSULE | Freq: Once | ORAL | Status: AC
  Administered 2016-01-05: 25 mg via ORAL
  Filled 2016-01-05: qty 1

## 2016-01-05 MED ORDER — METHADONE HCL 5 MG PO TABS
5.0000 mg | ORAL_TABLET | Freq: Every day | ORAL | Status: DC
  Administered 2016-01-05 – 2016-01-09 (×5): 5 mg via ORAL
  Filled 2016-01-05 (×5): qty 1

## 2016-01-05 MED ORDER — HYDROMORPHONE HCL 2 MG/ML IJ SOLN: 0.0250 mg/kg | INTRAMUSCULAR | Status: AC

## 2016-01-05 MED ORDER — HYDROXYUREA 500 MG PO CAPS
500.0000 mg | ORAL_CAPSULE | Freq: Every day | ORAL | Status: DC
  Administered 2016-01-05 – 2016-01-09 (×5): 500 mg via ORAL
  Filled 2016-01-05 (×5): qty 1

## 2016-01-05 MED ORDER — SODIUM CHLORIDE 0.9 % IV SOLN
25.0000 mg | INTRAVENOUS | Status: DC | PRN
  Administered 2016-01-06: 25 mg via INTRAVENOUS
  Filled 2016-01-05 (×4): qty 0.5

## 2016-01-05 MED ORDER — DIPHENHYDRAMINE HCL 25 MG PO CAPS
25.0000 mg | ORAL_CAPSULE | ORAL | Status: DC | PRN
  Administered 2016-01-05: 25 mg via ORAL
  Administered 2016-01-07 – 2016-01-08 (×3): 50 mg via ORAL
  Filled 2016-01-05: qty 2
  Filled 2016-01-05: qty 1
  Filled 2016-01-05 (×3): qty 2

## 2016-01-05 MED ORDER — NALOXONE HCL 0.4 MG/ML IJ SOLN: 0.4000 mg | INTRAMUSCULAR | Status: DC | PRN

## 2016-01-05 MED ORDER — KETOROLAC TROMETHAMINE 30 MG/ML IJ SOLN
30.0000 mg | Freq: Four times a day (QID) | INTRAMUSCULAR | Status: DC | PRN
  Administered 2016-01-06: 30 mg via INTRAVENOUS
  Filled 2016-01-05: qty 1

## 2016-01-05 MED ORDER — SODIUM CHLORIDE 0.9% FLUSH: 9.0000 mL | INTRAVENOUS | Status: DC | PRN

## 2016-01-05 MED ORDER — ENOXAPARIN SODIUM 40 MG/0.4ML ~~LOC~~ SOLN
40.0000 mg | SUBCUTANEOUS | Status: DC
  Administered 2016-01-08: 40 mg via SUBCUTANEOUS
  Filled 2016-01-05 (×3): qty 0.4

## 2016-01-05 MED ORDER — HYDROMORPHONE 1 MG/ML IV SOLN
INTRAVENOUS | Status: DC
  Administered 2016-01-05: 3.5 mg via INTRAVENOUS
  Administered 2016-01-05: 7 mg via INTRAVENOUS
  Administered 2016-01-05: 11:00:00 via INTRAVENOUS
  Administered 2016-01-06: 8.4 mg via INTRAVENOUS
  Administered 2016-01-06: 4.9 mg via INTRAVENOUS
  Administered 2016-01-06: 11.9 mg via INTRAVENOUS
  Administered 2016-01-06: 6.24 mg via INTRAVENOUS
  Administered 2016-01-06: 5.9 mg via INTRAVENOUS
  Administered 2016-01-06: 18:00:00 via INTRAVENOUS
  Administered 2016-01-06: 4.9 mg via INTRAVENOUS
  Administered 2016-01-07: 7.7 mg via INTRAVENOUS
  Administered 2016-01-07: 4.2 mg via INTRAVENOUS
  Administered 2016-01-07: 5.6 mg via INTRAVENOUS
  Administered 2016-01-07: 3.5 mg via INTRAVENOUS
  Administered 2016-01-07: 4.2 mg via INTRAVENOUS
  Administered 2016-01-07 – 2016-01-08 (×2): 4.9 mg via INTRAVENOUS
  Administered 2016-01-08: 15.7 mg via INTRAVENOUS
  Administered 2016-01-08: 11.9 mg via INTRAVENOUS
  Administered 2016-01-08: 12.6 mg via INTRAVENOUS
  Administered 2016-01-08: 11:00:00 via INTRAVENOUS
  Administered 2016-01-08: 2.6 mg via INTRAVENOUS
  Administered 2016-01-08: 4.9 mg via INTRAVENOUS
  Administered 2016-01-09: 2.1 mg via INTRAVENOUS
  Administered 2016-01-09: 8.4 mg via INTRAVENOUS
  Administered 2016-01-09: 0.7 mg via INTRAVENOUS
  Administered 2016-01-09: 6.3 mg via INTRAVENOUS
  Filled 2016-01-05 (×6): qty 25

## 2016-01-05 MED ORDER — KETOROLAC TROMETHAMINE 30 MG/ML IJ SOLN
30.0000 mg | Freq: Once | INTRAMUSCULAR | Status: AC
  Administered 2016-01-05: 30 mg via INTRAVENOUS
  Filled 2016-01-05: qty 1

## 2016-01-05 MED ORDER — HYDROMORPHONE HCL 2 MG/ML IJ SOLN
0.0313 mg/kg | INTRAMUSCULAR | Status: AC
  Administered 2016-01-05: 2.3 mg via INTRAVENOUS
  Filled 2016-01-05 (×2): qty 2

## 2016-01-05 MED ORDER — HYDROMORPHONE HCL 2 MG/ML IJ SOLN: 0.0313 mg/kg | INTRAMUSCULAR | Status: AC

## 2016-01-05 MED ORDER — SENNA 8.6 MG PO TABS
1.0000 | ORAL_TABLET | Freq: Every day | ORAL | Status: DC
  Administered 2016-01-05 – 2016-01-09 (×5): 8.6 mg via ORAL
  Filled 2016-01-05 (×5): qty 1

## 2016-01-05 MED ORDER — FOLIC ACID 1 MG PO TABS
1.0000 mg | ORAL_TABLET | Freq: Every day | ORAL | Status: DC
  Administered 2016-01-05 – 2016-01-09 (×5): 1 mg via ORAL
  Filled 2016-01-05 (×5): qty 1

## 2016-01-05 MED ORDER — HYDROMORPHONE HCL 2 MG/ML IJ SOLN
2.0000 mg | INTRAMUSCULAR | Status: DC | PRN
  Administered 2016-01-05 – 2016-01-09 (×27): 2 mg via INTRAVENOUS
  Filled 2016-01-05 (×28): qty 1

## 2016-01-05 MED ORDER — HYDROMORPHONE HCL 2 MG/ML IJ SOLN
0.0250 mg/kg | INTRAMUSCULAR | Status: AC
  Administered 2016-01-05: 1.8 mg via INTRAVENOUS
  Filled 2016-01-05: qty 1

## 2016-01-05 NOTE — ED Notes (Signed)
Patient c/o back pain r/t her SCC. Patient reports she was seen here two days ago and they wanted to admit her but she wanted to go home and attempt pain management at home. Patient states she takes 15mg  Oxycodone q4h PRN, took a dose at noon and again at 2000. Patient with tenderness on palpation of her back by MD. Patient is A&Ox4, NAD noted. Patient denies chest pain, cough, or SOB.

## 2016-01-05 NOTE — ED Notes (Signed)
After speaking with MD patient requested Benadryl and Zofran. MD notified. Orders written and acknowledged.

## 2016-01-05 NOTE — ED Provider Notes (Signed)
[facility] DEPT Provider Note   CSN: [id number] Arrival date & time: 01/04/16  2310  By signing my name below, I, Higinio Plan, attest that this documentation has been prepared under the direction and in the presence of Everlene Balls, MD . Electronically Signed: Higinio Plan, Scribe. 01/05/2016. 2:14 AM.  History   Chief Complaint Chief Complaint  Patient presents with  . Sickle Cell Pain Crisis   The history is provided by the patient. No language interpreter was used.   HPI Comments: [name] is a 25 y.o. female with chronic back pain and sickle cell anemia, who presents to the Emergency Department complaining of gradually worsening, lower back pain due to sickle cell pain that began 2 days ago and worsened today. Pt reports she is a SS sickle cell patient and was seen in the ED for similar symptoms on 8/20. Pt reports she requested to leave ED to be with her kids and was still experiencing pain when she left. She states she normally takes 15 mg oxycodone every 4 hours as needed for pain; she states her last dosage was at 8:00 PM with no relief. Pt denies chest pain and coughing. She states she is not currently followed by a hematologist due to a change in her insurance.   Past Medical History:  Diagnosis Date  . Blood transfusion    "lots"  . Blood transfusion without reported diagnosis   . Chronic back pain    "very severe; have knot in my back; from tight muscle; take RX and exercise for it"  . Depression 01/06/2011  . Exertional dyspnea    "sometimes"  . Genital HSV   . GERD (gastroesophageal reflux disease) 02/17/2011  . Migraines 11/08/11   "@ least twice/month"  . 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  . Mood swings ([place]) 11/08/11   "I go back and forth; real bad"  . Sickle cell anemia ([name])   . Sickle cell anemia with crisis ([place])   . Trichotillomania    h/o    Patient Active Problem List   Diagnosis Date Noted  . Nausea & vomiting 12/27/2015  .  Sickle cell crisis ([place]) 11/09/2015  . Sepsis ([place]) 11/09/2015  . Thrombocytosis ([place]) 11/09/2015  . Vaginal bleeding 11/09/2015  . Hyperbilirubinemia 11/09/2015  . Pyrexia   . Vaginal delivery 10/31/2015  . Hb-SS disease with crisis ([place])   . Sickle cell pain crisis ([place]) 10/26/2015  . Positive GBS test 10/18/2015  . Narcotic dependence ([place]) 10/06/2015  . 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 High risk for intrapartum complications, antepartum 08/12/2015  . Chronic pain 08/04/2015  . Cocaine use 07/24/2015  . Maternal sickle cell anemia affecting pregnancy, antepartum ([place]) 07/14/2015  . Anemia of chronic disease   . 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 Migraines 11/08/2011  . GERD (gastroesophageal reflux disease) 02/17/2011  . Depression 01/06/2011  . Sickle cell disease ([place]) 01/08/2009  . 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 CHOLECYSTECTOMY  05/2010  . DILATION AND CURETTAGE OF UTERUS  02/20/11   S/P miscarriage  . IR GENERIC HISTORICAL  12/23/2015   IR FLUORO GUIDE CV LINE RIGHT 12/23/2015 Jacqulynn Cadet, MD [facility]-INTERV RAD  . IR GENERIC HISTORICAL  12/23/2015   IR US GUIDE VASC ACCESS RIGHT 12/23/2015 Jacqulynn Cadet, MD [facility]-INTER[facility]    OB History    D[place]  Term Preterm AB Living   3 2 2   1 2    SAB TAB Ectopic Multiple Live Births   1     0 2      Obstetric Comments   Miscarried in October 2012 at about 7 weeks     Home Medications    Prior to Admission medications   Medication Sig Start Date End Date Taking? Authorizing Provider  aspirin-acetaminophen-caffeine (EXCEDRIN MIGRAINE) [id number] MG tablet Take 2 tablets by mouth every 6 (six) hours as needed for headache.    Yes Historical Provider, MD  hydroxyurea (HYDREA) 500 MG capsule Take 1 capsule (500 mg total) by mouth daily. May take with food to  minimize GI side effects. 12/15/15  Yes Tresa Garter, MD  ibuprofen (ADVIL,MOTRIN) 600 MG tablet Take 600 mg by mouth every 6 (six) hours as needed for mild pain or moderate pain.   Yes Historical Provider, MD  methadone (DOLOPHINE) 5 MG tablet Take 1 tablet (5 mg total) by mouth daily. 12/21/15  Yes Micheline Chapman, NP  oxyCODONE (ROXICODONE) 15 MG immediate release tablet Take 1 tablet (15 mg total) by mouth every 4 (four) hours as needed for pain. 12/30/15 01/14/16 Yes Tresa Garter, MD  valACYclovir (VALTREX) 500 MG tablet Take 1,000 mg by mouth daily.    Yes Historical Provider, MD    Family History Family History  Problem Relation Age of Onset  . Sickle cell trait Mother   . Sickle cell trait Father   . Diabetes Maternal Grandmother   . Diabetes Paternal Grandmother   . Hypertension Paternal Grandmother   . Diabetes Maternal Grandfather     Social History Social History  Substance Use Topics  . Smoking status: Former Smoker    Packs/day: 0.25    Years: 1.00    Types: Cigarettes    Quit date: 03/25/2013  . Smokeless tobacco: Never Used  . Alcohol use No     Comment: pt states she quit marijuan in May 2013. Rare ETOH, + cigarettes.  She is enrolled in school   Allergies   Food and Latex  Review of Systems Review of Systems 10 systems reviewed and all are negative for acute change except as noted in the HPI.  Physical Exam Updated Vital Signs BP 110/81 (BP Location: Left Arm)   Pulse 86   Temp 99 F (37.2 C) (Oral)   Resp 20   Ht 5\' 9"  (1.753 m)   Wt 163 lb (73.9 kg)   LMP 01/03/2016   SpO2 96%   BMI 24.07 kg/m   Physical Exam  Constitutional: She is oriented to person, place, and time. She appears well-developed and well-nourished. No distress.  HENT:  Head: Normocephalic and atraumatic.  Nose: Nose normal.  Mouth/Throat: Oropharynx is clear and moist. No oropharyngeal exudate.  Eyes: Conjunctivae and EOM are normal. Pupils are equal, round, and  reactive to light. No scleral icterus.  Neck: Normal range of motion. Neck supple. No JVD present. No tracheal deviation present. No thyromegaly present.  Cardiovascular: Normal rate, regular rhythm and normal heart sounds.  Exam reveals no gallop and no friction rub.   No murmur heard. Pulmonary/Chest: Effort normal and breath sounds normal. No respiratory distress. She has no wheezes. She exhibits no tenderness.  Abdominal: Soft. Bowel sounds are normal. She exhibits no distension and no mass. There is no tenderness. There is no rebound and no guarding.  Musculoskeletal: Normal range of motion. She exhibits no edema or tenderness.  Lymphadenopathy:  She has no cervical adenopathy.  Neurological: She is alert and oriented to person, place, and time. No cranial nerve deficit. She exhibits normal muscle tone.  Skin: Skin is warm and dry. No rash noted. No erythema. No pallor.  Nursing note and vitals reviewed.  ED Treatments / Results  Labs (all labs ordered are listed, but only abnormal results are displayed) Labs Reviewed  CBC WITH DIFFERENTIAL/PLATELET - Abnormal; Notable for the following:       Result Value   WBC 14.0 (*)    RBC 2.73 (*)    Hemoglobin 7.9 (*)    HCT 23.1 (*)    RDW 20.4 (*)    Platelets 440 (*)    Neutro Abs 8.8 (*)    Monocytes Absolute 1.5 (*)    Eosinophils Absolute 0.9 (*)    All other components within normal limits  RETICULOCYTES - Abnormal; Notable for the following:    Retic Ct Pct 16.2 (*)    RBC. 2.73 (*)    Retic Count, Manual 442.3 (*)    All other components within normal limits  BASIC METABOLIC PANEL    EKG  EKG Interpretation None      Radiology Dg Chest 2 View  Result Date: 01/03/2016 CLINICAL DATA:  Cough, back pain, history of sickle cell pain EXAM: CHEST  2 VIEW COMPARISON:  12/08/2015 FINDINGS: Cardiomediastinal silhouette is stable. No acute infiltrate or pleural effusion. No pulmonary edema. There is right IJ Port-A-Cath with  tip in distal SVC. No pneumothorax. Bony thorax is unremarkable. IMPRESSION: No active cardiopulmonary disease. Electronically Signed   By: Lahoma Crocker M.D.   On: 01/03/2016 13:08   Procedures Procedures  DIAGNOSTIC STUDIES:  Oxygen Saturation is 96% on RA, normal by my interpretation.    COORDINATION OF CARE:  2:06 AM Discussed treatment plan with pt at bedside and pt agreed to plan.  Medications Ordered in ED Medications  HYDROmorphone (DILAUDID) injection 2.8 mg (not administered)    Or  HYDROmorphone (DILAUDID) injection 2.8 mg (not administered)  HYDROmorphone (DILAUDID) injection 1.8 mg (1.8 mg Intravenous Given 01/05/16 0240)    Or  HYDROmorphone (DILAUDID) injection 1.8 mg ( Subcutaneous See Alternative 01/05/16 0240)  sodium chloride 0.9 % bolus 1,000 mL (0 mLs Intravenous Stopped 01/05/16 0359)  ketorolac (TORADOL) 30 MG/ML injection 30 mg (30 mg Intravenous Given 01/05/16 0240)  ondansetron (ZOFRAN-ODT) disintegrating tablet 4 mg (4 mg Oral Given 01/05/16 0241)  diphenhydrAMINE (BENADRYL) capsule 25 mg (25 mg Oral Given 01/05/16 0241)  ondansetron (ZOFRAN) injection 4 mg (4 mg Intravenous Given 01/05/16 0416)  HYDROmorphone (DILAUDID) injection 2.3 mg (2.3 mg Intravenous Given 01/05/16 0417)    Or  HYDROmorphone (DILAUDID) injection 2.3 mg ( Subcutaneous See Alternative 01/05/16 0417)    Initial Impression / Assessment and Plan / ED Course  I have reviewed the triage vital signs and the nursing notes.  Pertinent labs & imaging results that were available during my care of the patient were reviewed by me and considered in my medical decision making (see chart for details).  Clinical Course      I personally performed the services described in this documentation, which was scribed in my presence. The recorded information has been reviewed and is accurate.   Final Clinical Impressions(s) / ED Diagnoses   Final diagnoses:  None    New Prescriptions New Prescriptions    No medications on file  Patient persists emergency department for typical sickle cell pain in her back. Labs unremarkable. She was  given 3 doses of Dilaudid per protocol.She continues to complain of pain. I requested admission with Dr. Alcario Drought who accepts to [facility].   Everlene Balls, MD 01/05/16 [phone]

## 2016-01-05 NOTE — H&P (Signed)
H&P   Patient Demographics:    [name], is a 25 y.o. female  MRN: [id number]   DOB - 09-Sep-1990  Admit Date - 01/05/2016  Outpatient Primary MD for the patient is Angelica Chessman, MD  Outpatient Specialists: None   Patient coming from: Home  Chief Complaint  Patient presents with  . Sickle Cell Pain Crisis      HPI:    [name]  is a 25 y.o. femalewith medical history significant of sickle cell disease, migraine headache, GERD, depression, who presents with severe back pain. Patient states that she started having sudden onset back pain last night. Initially her back pain is in lower back, but now involving the wholeback, worse in lower back. The pain is constant, 10 out of 10 in severity, nonradiating. It is not aggravated or alleviated by any known factors. She states her symptoms are similar to previous pains associated with sickle cell.She was seen and evaluated in the ED 2 days ago, all physical and lab findings were reassuring, she was managed appropriately and discharged home from the ED. She denies any leg numbness or weakness. No incontinence of bladder or bowel movement. She has nausea but no vomiting. She hadmild abdominal pain, which has resolved. No diarrhea. Patient denies chest pain, cough, shortness breath, fever or chills. No unilateral weakness.    Review of systems:    In addition to the HPI above,  No Fever-chills, No Headache, No changes with Vision or hearing, No problems swallowing food or Liquids, No Chest pain, Cough or Shortness of Breath, No Abdominal pain, No Nausea or Vommitting, Bowel movements are regular, No Blood in stool or Urine, No dysuria, No new skin rashes or bruises, No new joints pains-aches,  No new weakness, tingling, numbness in any extremity, No recent weight gain or loss, No polyuria, polydypsia or polyphagia, No significant Mental Stressors.  A full 10 point Review of Systems was done, except as stated above,  all other Review of Systems were negative.  With Past History of the following :    Past Medical History:  Diagnosis Date  . Blood transfusion    "lots"  . Blood transfusion without reported diagnosis   . Chronic back pain    "very severe; have knot in my back; from tight muscle; take RX and exercise for it"  . Depression 01/06/2011  . Exertional dyspnea    "sometimes"  . Genital HSV   . GERD (gastroesophageal reflux disease) 02/17/2011  . Migraines 11/08/11   "@ least twice/month"  . 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  . Mood swings ([place]) 11/08/11   "I go back and forth; real bad"  . Sickle cell anemia ([name])   . Sickle cell anemia with crisis ([place])   . Trichotillomania    h/o      Past Surgical History:  Procedure Laterality Date  . CHOLECYSTECTOMY  05/2010  . DILATION AND CURETTAGE OF UTERUS  02/20/11   S/P miscarriage  . IR GENERIC HISTORICAL  12/23/2015   IR FLUORO GUIDE CV LINE RIGHT 12/23/2015 Jacqulynn Cadet, MD [facility]  . IR GENERIC HISTORICAL  12/23/2015   IR US GUIDE VASC ACCESS RIGHT 12/23/2015 Jacqulynn Cadet, MD [facility]      Social History:     Social History  Substance Use Topics  . Smoking status: Former Smoker    Packs/day: 0.25    Years: 1.00    Types: Cigarettes    Quit date: 03/25/2013  . Smokeless  tobacco: Never Used  . Alcohol use No     Comment: pt states she quit marijuan in May 2013. Rare ETOH, + cigarettes.  She is enrolled in school      Family History :     Family History  Problem Relation Age of Onset  . Sickle cell trait Mother   . Sickle cell trait Father   . Diabetes Maternal Grandmother   . Diabetes Paternal Grandmother   . Hypertension Paternal Grandmother   . Diabetes Maternal Grandfather     Home Medications:   Prior to Admission medications   Medication Sig Start Date End Date Taking? Authorizing Provider  aspirin-acetaminophen-caffeine (EXCEDRIN MIGRAINE) [id number] MG tablet Take 2 tablets by  mouth every 6 (six) hours as needed for headache.    Yes Historical Provider, MD  hydroxyurea (HYDREA) 500 MG capsule Take 1 capsule (500 mg total) by mouth daily. May take with food to minimize GI side effects. 12/15/15  Yes Tresa Garter, MD  ibuprofen (ADVIL,MOTRIN) 600 MG tablet Take 600 mg by mouth every 6 (six) hours as needed for mild pain or moderate pain.   Yes Historical Provider, MD  methadone (DOLOPHINE) 5 MG tablet Take 1 tablet (5 mg total) by mouth daily. 12/21/15  Yes Micheline Chapman, NP  oxyCODONE (ROXICODONE) 15 MG immediate release tablet Take 1 tablet (15 mg total) by mouth every 4 (four) hours as needed for pain. 12/30/15 01/14/16 Yes Tresa Garter, MD  valACYclovir (VALTREX) 500 MG tablet Take 1,000 mg by mouth daily.    Yes Historical Provider, MD     Allergies:     Allergies  Allergen Reactions  . Food Hives and Other (See Comments)    Pt states that she is allergic to carrots.   . Latex Rash     Physical Exam:   Vitals  Blood pressure (!) 107/56, pulse 98, temperature 98 F (36.7 C), temperature source Oral, resp. rate 16, height 5\' 9"  (1.753 m), weight 163 lb 5.8 oz (74.1 kg), last menstrual period 01/03/2016, SpO2 99 %, not currently breastfeeding.  General: Alert, awake, oriented x3, in no acute distress.  HEENT: [place]/AT PEERL, EOMI, anicteric Neck: Trachea midline,  no masses, no thyromegal,y no JVD, no carotid bruit OROPHARYNX:  Moist, No exudate/ erythema/lesions.  Heart: Regular rate and rhythm, without murmurs, rubs, gallops, PMI non-displaced, no heaves or thrills on palpation.  Lungs: Clear to auscultation, no wheezing or rhonchi noted. No increased vocal fremitus resonant to percussion  Abdomen: Soft, nontender, nondistended, positive bowel sounds, no masses no hepatosplenomegaly noted.  Neuro: No focal neurological deficits noted cranial nerves II through XII grossly intact.  Strength at functional baseline in bilateral upper and lower  extremities. Musculoskeletal: Whole back showed mild tenderness to touch. No warmth swelling or erythema around joints, no spinal tenderness noted. Psychiatric: Patient alert and oriented x3, good insight and cognition, good recent to remote recall. Genitalia: External inspection shows normal anatomy. No protrusion noted.    Data Review:    CBC  Recent Labs Lab 01/03/16 1318 01/05/16 0242  WBC 11.9* 14.0*  HGB 8.6* 7.9*  HCT 25.2* 23.1*  PLT 543* 440*  MCV 87.5 84.6  MCH 29.9 28.9  MCHC 34.1 34.2  RDW 22.4* 20.4*  LYMPHSABS 0.8 2.7  MONOABS 0.7 1.5*  EOSABS 0.5 0.9*  BASOSABS 0.0 0.0   ------------------------------------------------------------------------------------------------------------------  Chemistries   Recent Labs Lab 01/03/16 1318 01/05/16 0242  NA 139 138  K 4.0 3.5  CL 111 109  CO2 23 23  GLUCOSE 91 92  BUN 11 7  CREATININE 0.51 0.58  CALCIUM 9.4 9.2  AST 18  --   ALT 15  --   ALKPHOS 52  --   BILITOT 1.9*  --    ------------------------------------------------------------------------------------------------------------------ estimated creatinine clearance is 113.3 mL/min (by C-G formula based on SCr of 0.8 mg/dL). ------------------------------------------------------------------------------------------------------------------ No results for input(s): TSH, T4TOTAL, T3FREE, THYROIDAB in the last 72 hours.  Invalid input(s): FREET3  Coagulation profile No results for input(s): INR, PROTIME in the last 168 hours. ------------------------------------------------------------------------------------------------------------------- No results for input(s): DDIMER in the last 72 hours. -------------------------------------------------------------------------------------------------------------------  Cardiac Enzymes No results for input(s): CKMB, TROPONINI, MYOGLOBIN in the last 168 hours.  Invalid input(s):  CK ------------------------------------------------------------------------------------------------------------------ No results found for: BNP   ---------------------------------------------------------------------------------------------------------------  Urinalysis    Component Value Date/Time   COLORURINE YELLOW 11/08/2015 2046   APPEARANCEUR CLEAR 11/08/2015 2046   APPEARANCEUR Clear 05/06/2012 1546   LABSPEC 1.015 11/08/2015 2046   LABSPEC 1.011 05/06/2012 1546   PHURINE 6.5 11/08/2015 2046   GLUCOSEU NEGATIVE 11/08/2015 2046   GLUCOSEU Negative 05/06/2012 1546   HGBUR MODERATE (A) 11/08/2015 2046   HGBUR negative 01/08/2009 1329   BILIRUBINUR NEGATIVE 11/08/2015 2046   BILIRUBINUR Negative 05/06/2012 [place] 11/08/2015 2046   PROTEINUR NEGATIVE 11/08/2015 2046   UROBILINOGEN 2.0 (H) 10/26/2015 0817   NITRITE NEGATIVE 11/08/2015 2046   LEUKOCYTESUR TRACE (A) 11/08/2015 2046   LEUKOCYTESUR Negative 05/06/2012 1546    ----------------------------------------------------------------------------------------------------------------   Imaging Results:    Dg Chest 2 View  Result Date: 01/03/2016 CLINICAL DATA:  Cough, back pain, history of sickle cell pain EXAM: CHEST  2 VIEW COMPARISON:  12/08/2015 FINDINGS: Cardiomediastinal silhouette is stable. No acute infiltrate or pleural effusion. No pulmonary edema. There is right IJ Port-A-Cath with tip in distal SVC. No pneumothorax. Bony thorax is unremarkable. IMPRESSION: No active cardiopulmonary disease. Electronically Signed   By: Lahoma Crocker M.D.   On: 01/03/2016 13:08    Assessment & Plan:    Active Problems:   Sickle cell pain crisis ([name])   Sickle cell anemia with crisis ([place])  1. Hb SS with crisis: Patient will be started on IVF, IV Toradol, IV Dilaudid via PCA and clinician assistant doses.   2. Leukocytosis: Pt has had an increase in WBC today. However there is no evidence of infection. My  suspicion is that this is due to increased bone marrow activity.   3. Chronic pain Syndrome: Continue Methadone 5 mg QHS.   Patient is known from previous admissions to self-regulate her dilaudid PCA pump setting and IVF rate. She has been fore-warned about this behavior. She verbalized understanding and promised not to do such thing anymore.   DVT Prophylaxis Heparin -  Lovenox   AM Labs Ordered, also please review Full Orders  Family Communication: Admission, patients condition and plan of care including tests being ordered have been discussed with the patient who indicate understanding and agree with the plan and Code Status.  Code Status Full  Likely DC to Home  Condition GUARDED   Consults called: None   Admission status: Inpatient  Time spent in minutes : 50 minutes   Davon Folta MD, MHA, CPE on 01/05/2016 at 8:40 AM

## 2016-01-06 LAB — CBC WITH DIFFERENTIAL/PLATELET
BASOS ABS: 0.1 10*3/uL (ref 0.0–0.1)
Basophils Relative: 1 %
EOS ABS: 2.1 10*3/uL — AB (ref 0.0–0.7)
EOS PCT: 16 %
HCT: 20.9 % — ABNORMAL LOW (ref 36.0–46.0)
Hemoglobin: 7.1 g/dL — ABNORMAL LOW (ref 12.0–15.0)
Lymphocytes Relative: 24 %
Lymphs Abs: 3.2 10*3/uL (ref 0.7–4.0)
MCH: 28.5 pg (ref 26.0–34.0)
MCHC: 34 g/dL (ref 30.0–36.0)
MCV: 83.9 fL (ref 78.0–100.0)
Monocytes Absolute: 1.3 10*3/uL — ABNORMAL HIGH (ref 0.1–1.0)
Monocytes Relative: 10 %
Neutro Abs: 6.7 10*3/uL (ref 1.7–7.7)
Neutrophils Relative %: 50 %
PLATELETS: 417 10*3/uL — AB (ref 150–400)
RBC: 2.49 MIL/uL — AB (ref 3.87–5.11)
RDW: 20 % — ABNORMAL HIGH (ref 11.5–15.5)
WBC: 13.4 10*3/uL — AB (ref 4.0–10.5)

## 2016-01-06 LAB — BASIC METABOLIC PANEL
Anion gap: 5 (ref 5–15)
BUN: 8 mg/dL (ref 6–20)
CALCIUM: 9 mg/dL (ref 8.9–10.3)
CO2: 25 mmol/L (ref 22–32)
CREATININE: 0.53 mg/dL (ref 0.44–1.00)
Chloride: 109 mmol/L (ref 101–111)
GFR calc non Af Amer: >60 mL/min (ref >60)
GLUCOSE: 102 mg/dL — AB (ref 65–99)
Potassium: 4.1 mmol/L (ref 3.5–5.1)
Sodium: 139 mmol/L (ref 135–145)

## 2016-01-06 LAB — RETICULOCYTES
RBC.: 2.49 MIL/uL — ABNORMAL LOW (ref 3.87–5.11)
RETIC COUNT ABSOLUTE: 326.2 10*3/uL — AB (ref 19.0–186.0)
RETIC CT PCT: 13.1 % — AB (ref 0.4–3.1)

## 2016-01-06 MED ORDER — SODIUM CHLORIDE 0.9% FLUSH: 10.0000 mL | INTRAVENOUS | Status: DC | PRN

## 2016-01-06 MED ORDER — KETOROLAC TROMETHAMINE 30 MG/ML IJ SOLN
30.0000 mg | Freq: Four times a day (QID) | INTRAMUSCULAR | Status: DC | PRN
  Administered 2016-01-06: 30 mg via INTRAVENOUS
  Filled 2016-01-06 (×2): qty 1

## 2016-01-06 MED ORDER — PHENOL 1.4 % MT LIQD
1.0000 | OROMUCOSAL | Status: DC | PRN
  Filled 2016-01-06: qty 177

## 2016-01-06 MED ORDER — SODIUM CHLORIDE 0.9% FLUSH
10.0000 mL | Freq: Two times a day (BID) | INTRAVENOUS | Status: DC
  Administered 2016-01-07 – 2016-01-08 (×2): 10 mL

## 2016-01-06 MED ORDER — IBUPROFEN 200 MG PO TABS: 600.0000 mg | ORAL_TABLET | Freq: Four times a day (QID) | ORAL | Status: DC | PRN

## 2016-01-06 NOTE — Progress Notes (Signed)
Nursing Note: Pt c/o itching and sore throat.Offered po benaryl and pt refused,crying says she cannot swallow it because her throat is sore.Ordered chloraseptic spray and will give IV benadryl..wbb

## 2016-01-06 NOTE — Progress Notes (Signed)
PROGRESS NOTE                                                                                                                                                                                                             Patient Demographics:    [name], is a 25 y.o. female, DOB - [birth year], [id number]  Admit date - 01/05/2016   Admitting Physician Etta Quill, DO  Outpatient Primary MD for the patient is Angelica Chessman, MD  LOS - 1  Outpatient Specialists: S[facility] cell clinic   Chief Complaint  Patient presents with  . Sickle Cell Pain Crisis       Brief Narrative  25 year old female with history of sickle cell disease, migraine headaches, GERD, depression presented with severe low back pain of acute onset. She is typical of her sickle cell pain crisis. Vitals in the ED and labs reassuring. Admitted on Dilaudid PCA for sickle cell pain crisis.    Subjective:   Patient complain of pain in her lower and upper back. Has severe nausea.   Assessment  & Plan :    Active Problems:   Sickle cell anemia with crisis ([name]) On Dilaudid PCA with moderate relief, continue IV Toradol, IV fluids, antiemetics. Resume home dose methadone. Wean PCA as tolerated. IV Dilaudid 2 mg every 3 hours as needed for breakthrough pain. Monitor for respiratory distress or lethargy while on PCA.  Leukocytosis Likely reactive. Monitor.    Code Status : Full code  Family Communication  : None at bedside  Disposition Plan  : Home possibly tomorrow if able to wean off Dilaudid PCA.  Barriers For Discharge : Active symptoms  Consults  :  None  Procedures  : None  DVT Prophylaxis  :  Lovenox -  Lab Results  Component Value Date   PLT 417 (H) 01/06/2016    Antibiotics  :  None  Anti-infectives    None        Objective:   Vitals:   01/06/16 0800 01/06/16 1036 01/06/16 1155 01/06/16 1444  BP:  122/67   133/82  Pulse:  (!) 102  99  Resp: 13 16 19 18   Temp:  98.6 F (37 C)  98 F (36.7 C)  TempSrc:  Oral  Oral  SpO2: 100% 98% 96% 97%  Weight:  Height:        Wt Readings from Last 3 Encounters:  01/05/16 74.1 kg (163 lb 5.8 oz)  12/29/15 85.3 kg (188 lb 0.8 oz)  12/23/15 75.8 kg (167 lb)     Intake/Output Summary (Last 24 hours) at 01/06/16 1619 Last data filed at 01/06/16 1405  Gross per 24 hour  Intake           4480.2 ml  Output                0 ml  Net           4480.2 ml     Physical Exam  Gen: Stress with nausea and pain HEENT: Pallor present, dry mucosa, supple neck Chest: clear b/l, no added sounds CVS: N S1&S2, no murmurs, rubs or gallop GI: soft, NT, ND, BS+ Musculoskeletal: warm, back tenderness,     Data Review:    CBC  Recent Labs Lab 01/03/16 1318 01/05/16 0242 01/06/16 0600  WBC 11.9* 14.0* 13.4*  HGB 8.6* 7.9* 7.1*  HCT 25.2* 23.1* 20.9*  PLT 543* 440* 417*  MCV 87.5 84.6 83.9  MCH 29.9 28.9 28.5  MCHC 34.1 34.2 34.0  RDW 22.4* 20.4* 20.0*  LYMPHSABS 0.8 2.7 3.2  MONOABS 0.7 1.5* 1.3*  EOSABS 0.5 0.9* 2.1*  BASOSABS 0.0 0.0 0.1    Chemistries   Recent Labs Lab 01/03/16 1318 01/05/16 0242 01/06/16 0600  NA 139 138 139  K 4.0 3.5 4.1  CL 111 109 109  CO2 23 23 25   GLUCOSE 91 92 102*  BUN 11 7 8   CREATININE 0.51 0.58 0.53  CALCIUM 9.4 9.2 9.0  AST 18  --   --   ALT 15  --   --   ALKPHOS 52  --   --   BILITOT 1.9*  --   --    ------------------------------------------------------------------------------------------------------------------ No results for input(s): CHOL, HDL, LDLCALC, TRIG, CHOLHDL, LDLDIRECT in the last 72 hours.  No results found for: HGBA1C ------------------------------------------------------------------------------------------------------------------ No results for input(s): TSH, T4TOTAL, T3FREE, THYROIDAB in the last 72 hours.  Invalid input(s):  FREET3 ------------------------------------------------------------------------------------------------------------------  Recent Labs  01/05/16 0242 01/06/16 0600  RETICCTPCT 16.2* 13.1*    Coagulation profile No results for input(s): INR, PROTIME in the last 168 hours.  No results for input(s): DDIMER in the last 72 hours.  Cardiac Enzymes No results for input(s): CKMB, TROPONINI, MYOGLOBIN in the last 168 hours.  Invalid input(s): CK ------------------------------------------------------------------------------------------------------------------ No results found for: BNP  Inpatient Medications  Scheduled Meds: . enoxaparin (LOVENOX) injection  40 mg Subcutaneous Q24H  . folic acid  1 mg Oral Daily  . HYDROmorphone   Intravenous Q4H  . hydroxyurea  500 mg Oral Daily  . methadone  5 mg Oral Daily  . senna  1 tablet Oral Daily   Continuous Infusions: . dextrose 5 % and 0.45% NaCl 150 mL/hr at 01/06/16 1315   PRN Meds:.diphenhydrAMINE **OR** diphenhydrAMINE (BENADRYL) IVPB(SICKLE CELL ONLY), HYDROmorphone (DILAUDID) injection, ibuprofen **OR** ketorolac, [START ON 01/10/2016] ibuprofen, naloxone **AND** sodium chloride flush, ondansetron (ZOFRAN) IV  Micro Results No results found for this or any previous visit (from the past 240 hour(s)).  Radiology Reports Dg Chest 2 View  Result Date: 01/03/2016 CLINICAL DATA:  Cough, back pain, history of sickle cell pain EXAM: CHEST  2 VIEW COMPARISON:  12/08/2015 FINDINGS: Cardiomediastinal silhouette is stable. No acute infiltrate or pleural effusion. No pulmonary edema. There is right IJ Port-A-Cath with tip in distal SVC. No pneumothorax.  Bony thorax is unremarkable. IMPRESSION: No active cardiopulmonary disease. Electronically Signed   By: Lahoma Crocker M.D.   On: 01/03/2016 13:08   Dg Chest 2 View  Result Date: 12/08/2015 CLINICAL DATA:  Sickle cell crisis.  Fever for 2 days. EXAM: CHEST  2 VIEW COMPARISON:  Chest x-ray  11/08/2015. FINDINGS: The heart size and mediastinal contours are within normal limits. Both lungs are clear. 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 IMPRESSION: No active cardiopulmonary disease. Electronically Signed   By: Misty Stanley M.D.   On: 12/08/2015 21:50  Ir Fluoro Guide Cv Line Right  Result Date: 12/23/2015 INDICATION: 25 year old female with sickle cell disease and very poor venous access. She is here for placement of a port catheter to provide durable venous access. EXAM: IMPLANTED PORT A CATH PLACEMENT WITH ULTRASOUND AND FLUOROSCOPIC GUIDANCE MEDICATIONS: 2 g Ancef; The antibiotic was administered within an appropriate time interval prior to skin puncture. ANESTHESIA/SEDATION: Versed 5 mg IV; Fentanyl 75 mcg IV; Moderate Sedation Time:  28 minutes The patient's level of consciousness and vital signs were continuously monitored during the procedure by the interventional radiology nurse under my direct supervision. FLUOROSCOPY TIME:  0 minutes, 12 seconds (1.7 mGy) COMPLICATIONS: None immediate. PROCEDURE: The right neck and chest was prepped with chlorhexidine, and draped in the usual sterile fashion using maximum barrier technique (cap and mask, sterile gown, sterile gloves, large sterile sheet, hand hygiene and cutaneous antiseptic). Antibiotic prophylaxis was provided with 2g Ancef administered IV one hour prior to skin incision. Local anesthesia was attained by infiltration with 1% lidocaine with epinephrine. Ultrasound demonstrated patency of the right internal jugular vein, and this was documented with an image. Under real-time ultrasound guidance, this vein was accessed with a 21 gauge micropuncture needle and image documentation was performed. A small dermatotomy was made at the access site with an 11 scalpel. A 0.018" wire was advanced into the SVC and the access needle exchanged for a 51F micropuncture vascular sheath. The 0.018" wire was then  removed and a 0.035" wire advanced into the IVC. An appropriate location for the subcutaneous reservoir was selected below the clavicle and an incision was made through the skin and underlying soft tissues. The subcutaneous tissues were then dissected using a combination of blunt and sharp surgical technique and a pocket was formed. A single lumen power injectable portacatheter was then tunneled through the subcutaneous tissues from the pocket to the dermatotomy and the port reservoir placed within the subcutaneous pocket. The venous access site was then serially dilated and a peel away vascular sheath placed over the wire. The wire was removed and the port catheter advanced into position under fluoroscopic guidance. The catheter tip is positioned in the upper right atrium. This was documented with a spot image. The portacatheter was then tested and found to flush and aspirate well. The port was flushed with saline followed by 100 units/mL heparinized saline. The pocket was then closed in two layers using first subdermal inverted interrupted absorbable sutures followed by a running subcuticular suture. The epidermis was then sealed with Dermabond. The dermatotomy at the venous access site was also sealed with Dermabond. IMPRESSION: Successful placement of a right IJ approach Power Port with ultrasound and fluoroscopic guidance. The catheter is ready for use. Signed, Criselda Peaches, MD [facility] Electronically Signed   By: Jacqulynn Cadet M.D.   On: 12/23/2015 15:29   Ir US Guide Vasc Access Right  Result  Date: 12/23/2015 INDICATION: 25 year old female with sickle cell disease and very poor venous access. She is here for placement of a port catheter to provide durable venous access. EXAM: IMPLANTED PORT A CATH PLACEMENT WITH ULTRASOUND AND FLUOROSCOPIC GUIDANCE MEDICATIONS: 2 g Ancef; The antibiotic was administered within an appropriate time  interval prior to skin puncture. ANESTHESIA/SEDATION: Versed 5 mg IV; Fentanyl 75 mcg IV; Moderate Sedation Time:  28 minutes The patient's level of consciousness and vital signs were continuously monitored during the procedure by the interventional radiology nurse under my direct supervision. FLUOROSCOPY TIME:  0 minutes, 12 seconds (1.7 mGy) COMPLICATIONS: None immediate. PROCEDURE: The right neck and chest was prepped with chlorhexidine, and draped in the usual sterile fashion using maximum barrier technique (cap and mask, sterile gown, sterile gloves, large sterile sheet, hand hygiene and cutaneous antiseptic). Antibiotic prophylaxis was provided with 2g Ancef administered IV one hour prior to skin incision. Local anesthesia was attained by infiltration with 1% lidocaine with epinephrine. Ultrasound demonstrated patency of the right internal jugular vein, and this was documented with an image. Under real-time ultrasound guidance, this vein was accessed with a 21 gauge micropuncture needle and image documentation was performed. A small dermatotomy was made at the access site with an 11 scalpel. A 0.018" wire was advanced into the SVC and the access needle exchanged for a 74F micropuncture vascular sheath. The 0.018" wire was then removed and a 0.035" wire advanced into the IVC. An appropriate location for the subcutaneous reservoir was selected below the clavicle and an incision was made through the skin and underlying soft tissues. The subcutaneous tissues were then dissected using a combination of blunt and sharp surgical technique and a pocket was formed. A single lumen power injectable portacatheter was then tunneled through the subcutaneous tissues from the pocket to the dermatotomy and the port reservoir placed within the subcutaneous pocket. The venous access site was then serially dilated and a peel away vascular sheath placed over the wire. The wire was removed and the port catheter advanced into position  under fluoroscopic guidance. The catheter tip is positioned in the upper right atrium. This was documented with a spot image. The portacatheter was then tested and found to flush and aspirate well. The port was flushed with saline followed by 100 units/mL heparinized saline. The pocket was then closed in two layers using first subdermal inverted interrupted absorbable sutures followed by a running subcuticular suture. The epidermis was then sealed with Dermabond. The dermatotomy at the venous access site was also sealed with Dermabond. IMPRESSION: Successful placement of a right IJ approach Power Port with ultrasound and fluoroscopic guidance. The catheter is ready for use. Signed, Criselda Peaches, MD [facility] Electronically Signed   By: Jacqulynn Cadet M.D.   On: 12/23/2015 15:29    Time Spent in minutes  25   Louellen Molder M.D on 01/06/2016 at 4:19 PM  Between 7am to 7pm - Pager - [phone]  After 7pm go to [email or web address] - password [facility]  [facility] -  Office  [phone]

## 2016-01-07 MED ORDER — GUAIFENESIN-DM 100-10 MG/5ML PO SYRP: 5.0000 mL | ORAL_SOLUTION | ORAL | Status: DC | PRN

## 2016-01-07 MED ORDER — MAGIC MOUTHWASH
5.0000 mL | Freq: Four times a day (QID) | ORAL | Status: DC | PRN
  Filled 2016-01-07: qty 5

## 2016-01-07 NOTE — Care Management Note (Signed)
Case Management Note  Patient Details  Name: [name] MRN: [id number] Date of Birth: 05/15/1991  Subjective/Objective:     25 yo admitted with [facility]               Action/Plan: From home with family. Chart reviewed and CM following for DC needs.  Expected Discharge Date:   (UNKNOWN)               Expected Discharge Plan:  Home/Self Care  In-House Referral:     Discharge planning Services  CM Consult  Post Acute Care Choice:    Choice offered to:     DME Arranged:    DME Agency:     HH Arranged:    HH Agency:     Status of Service:  In process, will continue to follow  If discussed at Long Length of Stay Meetings, dates discussed:    Additional CommentsLynnell Catalan, RN 01/07/2016, 2:17 PM  [phone]

## 2016-01-07 NOTE — Consult Note (Signed)
   [facility] CM Inpatient Consult   01/07/2016  VALISSA IMONDI [birth year] [id number]    Patient screened for [place] [facility] services. She was active in the past. Martin Majestic to bedside to remind her to contact [employer] Management program if needed. She is agreeable to this. [facility] Care Management brochure with contact information left at bedside. Inpatient RNCM aware.    Marthenia Rolling, MSN-Ed, RN,BSN [facility] Liaison [phone]

## 2016-01-07 NOTE — Progress Notes (Signed)
PROGRESS NOTE                                                                                                                                                                                                             Patient Demographics:    [name], is a 25 y.o. female, DOB - 04/18/1991, [id number]  Admit date - 01/05/2016   Admitting Physician Etta Quill, DO  Outpatient Primary MD for the patient is Angelica Chessman, MD  LOS - 2  Outpatient Specialists: [facility]   Chief Complaint  Patient presents with  . Sickle Cell Pain Crisis       Brief Narrative  25 year old female with history of sickle cell disease, migraine headaches, GERD, depression presented with severe low back pain of acute onset. She is typical of her sickle cell pain crisis. Vitals in the ED and labs reassuring. Admitted on Dilaudid PCA for sickle cell pain crisis.    Subjective:   Pain better since admission but still significant and unable to sleep well last night. C/o pain on swallowing and dry cough   Assessment  & Plan :    Active Problems:   Sickle cell anemia with crisis ([place]) On Dilaudid PCA with Some improvement, still in pain. Patient has maxed out her on her PCA dosing. continue IV Toradol, IV fluids, antiemetics. Continue home dose methadone. Wean PCA as tolerated. IV Dilaudid 2 mg every 3 hours as needed for breakthrough pain. Monitor for respiratory distress or lethargy while on PCA.  Leukocytosis Likely reactive.     Code Status : Full code  Family Communication  : None at bedside  Disposition Plan  : Home possibly tomorrow if able to wean off Dilaudid PCA.  Barriers For Discharge : Active symptoms  Consults  :  None  Procedures  : None  DVT Prophylaxis  :  Lovenox -  Lab Results  Component Value Date   PLT 417 (H) 01/06/2016    Antibiotics  :  None  Anti-infectives    None         Objective:   Vitals:   01/07/16 0700 01/07/16 0855 01/07/16 0958 01/07/16 1323  BP: 102/60  120/78   Pulse: 87  81   Resp: 12 14 13 16   Temp: 98.3 F (36.8 C)  99.1 F (37.3 C)  TempSrc: Oral  Oral   SpO2: 96% 95% 98% 93%  Weight:      Height:        Wt Readings from Last 3 Encounters:  01/05/16 74.1 kg (163 lb 5.8 oz)  12/29/15 85.3 kg (188 lb 0.8 oz)  12/23/15 75.8 kg (167 lb)     Intake/Output Summary (Last 24 hours) at 01/07/16 1328 Last data filed at 01/07/16 0950  Gross per 24 hour  Intake           2383.3 ml  Output                0 ml  Net           2383.3 ml     Physical Exam  Gen: Still has low back pain HEENT:  moist cause Chest: clear b/l, no added sounds CVS: N S1&S2, no murmurs, rubs or gallop GI: soft, NT, ND, BS+ Musculoskeletal: warm, back tenderness,     Data Review:    CBC  Recent Labs Lab 01/03/16 1318 01/05/16 0242 01/06/16 0600  WBC 11.9* 14.0* 13.4*  HGB 8.6* 7.9* 7.1*  HCT 25.2* 23.1* 20.9*  PLT 543* 440* 417*  MCV 87.5 84.6 83.9  MCH 29.9 28.9 28.5  MCHC 34.1 34.2 34.0  RDW 22.4* 20.4* 20.0*  LYMPHSABS 0.8 2.7 3.2  MONOABS 0.7 1.5* 1.3*  EOSABS 0.5 0.9* 2.1*  BASOSABS 0.0 0.0 0.1    Chemistries   Recent Labs Lab 01/03/16 1318 01/05/16 0242 01/06/16 0600  NA 139 138 139  K 4.0 3.5 4.1  CL 111 109 109  CO2 23 23 25   GLUCOSE 91 92 102*  BUN 11 7 8   CREATININE 0.51 0.58 0.53  CALCIUM 9.4 9.2 9.0  AST 18  --   --   ALT 15  --   --   ALKPHOS 52  --   --   BILITOT 1.9*  --   --    ------------------------------------------------------------------------------------------------------------------ No results for input(s): CHOL, HDL, LDLCALC, TRIG, CHOLHDL, LDLDIRECT in the last 72 hours.  No results found for: HGBA1C ------------------------------------------------------------------------------------------------------------------ No results for input(s): TSH, T4TOTAL, T3FREE, THYROIDAB in the last 72  hours.  Invalid input(s): FREET3 ------------------------------------------------------------------------------------------------------------------  Recent Labs  01/05/16 0242 01/06/16 0600  RETICCTPCT 16.2* 13.1*    Coagulation profile No results for input(s): INR, PROTIME in the last 168 hours.  No results for input(s): DDIMER in the last 72 hours.  Cardiac Enzymes No results for input(s): CKMB, TROPONINI, MYOGLOBIN in the last 168 hours.  Invalid input(s): CK ------------------------------------------------------------------------------------------------------------------ No results found for: BNP  Inpatient Medications  Scheduled Meds: . enoxaparin (LOVENOX) injection  40 mg Subcutaneous Q24H  . folic acid  1 mg Oral Daily  . HYDROmorphone   Intravenous Q4H  . hydroxyurea  500 mg Oral Daily  . methadone  5 mg Oral Daily  . senna  1 tablet Oral Daily  . sodium chloride flush  10-40 mL Intracatheter Q12H   Continuous Infusions: . dextrose 5 % and 0.45% NaCl 1,000 mL (01/07/16 0407)   PRN Meds:.diphenhydrAMINE **OR** diphenhydrAMINE (BENADRYL) IVPB(SICKLE CELL ONLY), HYDROmorphone (DILAUDID) injection, ibuprofen **OR** ketorolac, [START ON 01/10/2016] ibuprofen, naloxone **AND** sodium chloride flush, ondansetron (ZOFRAN) IV, phenol, sodium chloride flush  Micro Results No results found for this or any previous visit (from the past 240 hour(s)).  Radiology Reports Dg Chest 2 View  Result Date: 01/03/2016 CLINICAL DATA:  Cough, back pain, history of sickle cell pain EXAM: CHEST  2 VIEW COMPARISON:  12/08/2015 FINDINGS: Cardiomediastinal silhouette is stable. No acute infiltrate or pleural effusion. No pulmonary edema. There is right IJ Port-A-Cath with tip in distal SVC. No pneumothorax. Bony thorax is unremarkable. IMPRESSION: No active cardiopulmonary disease. Electronically Signed   By: Lahoma Crocker M.D.   On: 01/03/2016 13:08   Dg Chest 2 View  Result Date:  12/08/2015 CLINICAL DATA:  Sickle cell crisis.  Fever for 2 days. EXAM: CHEST  2 VIEW COMPARISON:  Chest x-ray 11/08/2015. FINDINGS: The heart size and mediastinal contours are within normal limits. Both lungs are clear. 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 IMPRESSION: No active cardiopulmonary disease. Electronically Signed   By: Misty Stanley M.D.   On: 12/08/2015 21:50  Ir Fluoro Guide Cv Line Right  Result Date: 12/23/2015 INDICATION: 25 year old female with sickle cell disease and very poor venous access. She is here for placement of a port catheter to provide durable venous access. EXAM: IMPLANTED PORT A CATH PLACEMENT WITH ULTRASOUND AND FLUOROSCOPIC GUIDANCE MEDICATIONS: 2 g Ancef; The antibiotic was administered within an appropriate time interval prior to skin puncture. ANESTHESIA/SEDATION: Versed 5 mg IV; Fentanyl 75 mcg IV; Moderate Sedation Time:  28 minutes The patient's level of consciousness and vital signs were continuously monitored during the procedure by the interventional radiology nurse under my direct supervision. FLUOROSCOPY TIME:  0 minutes, 12 seconds (1.7 mGy) COMPLICATIONS: None immediate. PROCEDURE: The right neck and chest was prepped with chlorhexidine, and draped in the usual sterile fashion using maximum barrier technique (cap and mask, sterile gown, sterile gloves, large sterile sheet, hand hygiene and cutaneous antiseptic). Antibiotic prophylaxis was provided with 2g Ancef administered IV one hour prior to skin incision. Local anesthesia was attained by infiltration with 1% lidocaine with epinephrine. Ultrasound demonstrated patency of the right internal jugular vein, and this was documented with an image. Under real-time ultrasound guidance, this vein was accessed with a 21 gauge micropuncture needle and image documentation was performed. A small dermatotomy was made at the access site with an 11 scalpel. A 0.018" wire was advanced into  the SVC and the access needle exchanged for a 68F micropuncture vascular sheath. The 0.018" wire was then removed and a 0.035" wire advanced into the IVC. An appropriate location for the subcutaneous reservoir was selected below the clavicle and an incision was made through the skin and underlying soft tissues. The subcutaneous tissues were then dissected using a combination of blunt and sharp surgical technique and a pocket was formed. A single lumen power injectable portacatheter was then tunneled through the subcutaneous tissues from the pocket to the dermatotomy and the port reservoir placed within the subcutaneous pocket. The venous access site was then serially dilated and a peel away vascular sheath placed over the wire. The wire was removed and the port catheter advanced into position under fluoroscopic guidance. The catheter tip is positioned in the upper right atrium. This was documented with a spot image. The portacatheter was then tested and found to flush and aspirate well. The port was flushed with saline followed by 100 units/mL heparinized saline. The pocket was then closed in two layers using first subdermal inverted interrupted absorbable sutures followed by a running subcuticular suture. The epidermis was then sealed with Dermabond. The dermatotomy at the venous access site was also sealed with Dermabond. IMPRESSION: Successful placement of a right IJ approach Power Port with ultrasound and fluoroscopic guidance. The catheter is ready for use. Signed, Criselda Peaches, MD [facility] Inter[facility] Radiology  [facility] Radiology Electronically Signed   By: Jacqulynn Cadet M.D.   On: 12/23/2015 15:29   Ir US Guide Vasc Access Right  Result Date: 12/23/2015 INDICATION: 25 year old female with sickle cell disease and very poor venous access. She is here for placement of a port catheter to provide durable venous access. EXAM: IMPLANTED PORT A CATH PLACEMENT WITH ULTRASOUND AND  FLUOROSCOPIC GUIDANCE MEDICATIONS: 2 g Ancef; The antibiotic was administered within an appropriate time interval prior to skin puncture. ANESTHESIA/SEDATION: Versed 5 mg IV; Fentanyl 75 mcg IV; Moderate Sedation Time:  28 minutes The patient's level of consciousness and vital signs were continuously monitored during the procedure by the interventional radiology nurse under my direct supervision. FLUOROSCOPY TIME:  0 minutes, 12 seconds (1.7 mGy) COMPLICATIONS: None immediate. PROCEDURE: The right neck and chest was prepped with chlorhexidine, and draped in the usual sterile fashion using maximum barrier technique (cap and mask, sterile gown, sterile gloves, large sterile sheet, hand hygiene and cutaneous antiseptic). Antibiotic prophylaxis was provided with 2g Ancef administered IV one hour prior to skin incision. Local anesthesia was attained by infiltration with 1% lidocaine with epinephrine. Ultrasound demonstrated patency of the right internal jugular vein, and this was documented with an image. Under real-time ultrasound guidance, this vein was accessed with a 21 gauge micropuncture needle and image documentation was performed. A small dermatotomy was made at the access site with an 11 scalpel. A 0.018" wire was advanced into the SVC and the access needle exchanged for a 82F micropuncture vascular sheath. The 0.018" wire was then removed and a 0.035" wire advanced into the IVC. An appropriate location for the subcutaneous reservoir was selected below the clavicle and an incision was made through the skin and underlying soft tissues. The subcutaneous tissues were then dissected using a combination of blunt and sharp surgical technique and a pocket was formed. A single lumen power injectable portacatheter was then tunneled through the subcutaneous tissues from the pocket to the dermatotomy and the port reservoir placed within the subcutaneous pocket. The venous access site was then serially dilated and a peel  away vascular sheath placed over the wire. The wire was removed and the port catheter advanced into position under fluoroscopic guidance. The catheter tip is positioned in the upper right atrium. This was documented with a spot image. The portacatheter was then tested and found to flush and aspirate well. The port was flushed with saline followed by 100 units/mL heparinized saline. The pocket was then closed in two layers using first subdermal inverted interrupted absorbable sutures followed by a running subcuticular suture. The epidermis was then sealed with Dermabond. The dermatotomy at the venous access site was also sealed with Dermabond. IMPRESSION: Successful placement of a right IJ approach Power Port with ultrasound and fluoroscopic guidance. The catheter is ready for use. Signed, Criselda Peaches, MD [facility]iology [facility] Radiology Electronically Signed   By: Jacqulynn Cadet M.D.   On: 12/23/2015 15:29    Time Spent in minutes  25   Louellen Molder M.D on 01/07/2016 at 1:28 PM  Between 7am to 7pm - Pager - [phone]  After 7pm go to [email or web address] - password [email or web address]  [facility] -  Office  [phone]

## 2016-01-08 NOTE — Progress Notes (Signed)
Subjective: A 25 year old female admitted with sickle cell painful crisis. Pain is currently down to 7 out of 10 from 10 out of 10. She is still on Dilaudid PCA and has used 32 mg in the last 24 hours. No fever and no chills no shortness of breath. Patient denied any significant pain. She thinks she is improving more and more every day.  Objective: Vital signs in last 24 hours: Temp:  [98.2 F (36.8 C)-98.7 F (37.1 C)] 98.5 F (36.9 C) (08/25 1340) Pulse Rate:  [90-100] 90 (08/25 1340) Resp:  [11-17] 13 (08/25 1629) BP: (88-120)/(57-67) 105/57 (08/25 1340) SpO2:  [93 %-97 %] 93 % (08/25 1629) Weight:  [74.3 kg (163 lb 11.2 oz)] 74.3 kg (163 lb 11.2 oz) (08/25 0519) Weight change:  Last BM Date: 01/06/16  Intake/Output from previous day: 08/24 0701 - 08/25 0700 In: 2680 [P.O.:1395; I.V.:1285] Out: -  Intake/Output this shift: Total I/O In: 2200 [P.O.:400; I.V.:1800] Out: -   General appearance: alert, cooperative and no distress Resp: clear to auscultation bilaterally Chest wall: no tenderness Cardio: regular rate and rhythm, S1, S2 normal, no murmur, click, rub or gallop GI: soft, non-tender; bowel sounds normal; no masses,  no organomegaly Extremities: extremities normal, atraumatic, no cyanosis or edema Pulses: 2+ and symmetric Skin: Skin color, texture, turgor normal. No rashes or lesions Neurologic: Grossly normal  Lab Results:  Recent Labs  01/06/16 0600  WBC 13.4*  HGB 7.1*  HCT 20.9*  PLT 417*   BMET  Recent Labs  01/06/16 0600  NA 139  K 4.1  CL 109  CO2 25  GLUCOSE 102*  BUN 8  CREATININE 0.53  CALCIUM 9.0    Studies/Results: No results found.  Medications: I have reviewed the patient's current medications.  Assessment/Plan: A 25 year old female admitted with sickle cell painful crisis.  #1 sickle cell painful crisis: I will keep patient on current Dilaudid PCA with Toradol. I will stop physician assisted dosing. Continue plan for  discharge tomorrow  #2 sickle cell anemia:  I will recheck CBC to follow her hemoglobin.  #3 leukocytosis: Due to vaso-occlusive crisis.  Continue monitoring.  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

## 2016-01-09 DIAGNOSIS — D5700 Hb-SS disease with crisis, unspecified: Principal | ICD-10-CM

## 2016-01-09 LAB — COMPREHENSIVE METABOLIC PANEL
ALK PHOS: 51 U/L (ref 38–126)
ALT: 14 U/L (ref 14–54)
AST: 19 U/L (ref 15–41)
Albumin: 3.7 g/dL (ref 3.5–5.0)
Anion gap: 5 (ref 5–15)
BUN: 7 mg/dL (ref 6–20)
CALCIUM: 8.6 mg/dL — AB (ref 8.9–10.3)
CHLORIDE: 108 mmol/L (ref 101–111)
CO2: 26 mmol/L (ref 22–32)
CREATININE: 0.53 mg/dL (ref 0.44–1.00)
GFR calc non Af Amer: >60 mL/min (ref >60)
Glucose, Bld: 110 mg/dL — ABNORMAL HIGH (ref 65–99)
Potassium: 3.7 mmol/L (ref 3.5–5.1)
SODIUM: 139 mmol/L (ref 135–145)
Total Bilirubin: 1.7 mg/dL — ABNORMAL HIGH (ref 0.3–1.2)
Total Protein: 6.3 g/dL — ABNORMAL LOW (ref 6.5–8.1)

## 2016-01-09 LAB — CBC WITH DIFFERENTIAL/PLATELET
BASOS PCT: 1 %
Basophils Absolute: 0.2 10*3/uL — ABNORMAL HIGH (ref 0.0–0.1)
Eosinophils Absolute: 2 10*3/uL — ABNORMAL HIGH (ref 0.0–0.7)
Eosinophils Relative: 12 %
HCT: 19.2 % — ABNORMAL LOW (ref 36.0–46.0)
HEMOGLOBIN: 6.7 g/dL — AB (ref 12.0–15.0)
LYMPHS PCT: 14 %
Lymphs Abs: 2.4 10*3/uL (ref 0.7–4.0)
MCH: 29.9 pg (ref 26.0–34.0)
MCHC: 34.9 g/dL (ref 30.0–36.0)
MCV: 85.7 fL (ref 78.0–100.0)
MONOS PCT: 7 %
Monocytes Absolute: 1.2 10*3/uL — ABNORMAL HIGH (ref 0.1–1.0)
NEUTROS ABS: 11 10*3/uL — AB (ref 1.7–7.7)
NEUTROS PCT: 66 %
PLATELETS: 429 10*3/uL — AB (ref 150–400)
RBC: 2.24 MIL/uL — ABNORMAL LOW (ref 3.87–5.11)
RDW: 22.4 % — ABNORMAL HIGH (ref 11.5–15.5)
WBC: 16.8 10*3/uL — ABNORMAL HIGH (ref 4.0–10.5)

## 2016-01-09 MED ORDER — CYCLOBENZAPRINE HCL 10 MG PO TABS: 10.0000 mg | ORAL_TABLET | Freq: Three times a day (TID) | ORAL | 0 refills | Status: DC | PRN

## 2016-01-09 MED ORDER — CYCLOBENZAPRINE HCL 10 MG PO TABS: 10.0000 mg | ORAL_TABLET | Freq: Three times a day (TID) | ORAL | Status: DC | PRN

## 2016-01-09 NOTE — Progress Notes (Signed)
CRITICAL VALUE ALERT  Critical value received:  Hgb 6.7  Date of notification:  01/09/16  Time of notification:  0623  Critical value read back:Yes.    Nurse who received alert:  Kyra Searles  MD notified (1st page):  NP Donnal Debar  Time of first page:  0627  MD notified (2nd page):  Time of second page:  Responding MD:  NP Donnal Debar  Time MD responded:  [phone]

## 2016-01-09 NOTE — Progress Notes (Signed)
Patient discharged to home, all discharge medications and instructions reviewed and questions answered.  Patient declined wheelchair assistance to vehicle states will ambulate.  

## 2016-01-09 NOTE — Discharge Summary (Signed)
Physician Discharge Summary  Patient ID: CORRA BENCH MRN: [id number] DOB/AGE: [birth year] 24 y.o.  Admit date: 01/05/2016 Discharge date: 01/09/2016  Admission Diagnoses:  Discharge Diagnoses:  Active Problems:   Sickle cell pain crisis ([place])   Sickle cell anemia with crisis [facility])   Discharged Condition: good  Hospital Course: patient was admitted with sickle cell painful crisis. She had a history of  Recurrent crisis. She was placed on her home methadone dose PCA as well as Toradol.  She also has some medication for breakthrough pain. She gradually improved. PCA dose was titrated down and patient currently off PCA. Pain is down from 10 out of 10-3 out of 10. She was discharged home on home regimen to follow up with her PCP. She has some back spasms and we gave her Flexeril.  Consults: None  Significant Diagnostic Studies: labs: CBCs and CMPs checked within normal limits  Treatments: IV hydration and analgesia: acetaminophen and Dilaudid  Discharge Exam: Blood pressure 112/61, pulse 87, temperature 98.7 F (37.1 C), temperature source Oral, resp. rate 14, height 5\' 9"  (1.753 m), weight 81.4 kg (179 lb 7.3 oz), last menstrual period 01/03/2016, SpO2 100 %, not currently breastfeeding. General appearance: alert, cooperative and no distress Head: Normocephalic, without obvious abnormality, atraumatic Neck: no adenopathy, no carotid bruit, no JVD, supple, symmetrical, trachea midline and thyroid not enlarged, symmetric, no tenderness/mass/nodules Back: symmetric, no curvature. ROM normal. No CVA tenderness. Resp: clear to auscultation bilaterally Chest wall: no tenderness Cardio: regular rate and rhythm, S1, S2 normal, no murmur, click, rub or gallop GI: soft, non-tender; bowel sounds normal; no masses,  no organomegaly Extremities: extremities normal, atraumatic, no cyanosis or edema Skin: Skin color, texture, turgor normal. No rashes or lesions Neurologic: Grossly  normal  Disposition: 01-Home or Self Care     Medication List    TAKE these medications   aspirin-acetaminophen-caffeine 250-250-65 MG tablet Commonly known as:  EXCEDRIN MIGRAINE Take 2 tablets by mouth every 6 (six) hours as needed for headache.   cyclobenzaprine 10 MG tablet Commonly known as:  FLEXERIL Take 1 tablet (10 mg total) by mouth 3 (three) times daily as needed for muscle spasms.   hydroxyurea 500 MG capsule Commonly known as:  HYDREA Take 1 capsule (500 mg total) by mouth daily. May take with food to minimize GI side effects.   ibuprofen 600 MG tablet Commonly known as:  ADVIL,MOTRIN Take 600 mg by mouth every 6 (six) hours as needed for mild pain or moderate pain.   methadone 5 MG tablet Commonly known as:  DOLOPHINE Take 1 tablet (5 mg total) by mouth daily.   oxyCODONE 15 MG immediate release tablet Commonly known as:  ROXICODONE Take 1 tablet (15 mg total) by mouth every 4 (four) hours as needed for pain.   valACYclovir 500 MG tablet Commonly known as:  VALTREX Take 1,000 mg by mouth daily.        SignedBarbette Merino 01/09/2016, 10:59 AM Time spent 33 minutes

## 2016-01-11 ENCOUNTER — Telehealth: Payer: Self-pay

## 2016-01-11 NOTE — Telephone Encounter (Signed)
Refill request for oxycodone and methadone. LOV 12/15/2015. Please advise. Thanks!

## 2016-01-12 ENCOUNTER — Other Ambulatory Visit: Payer: Self-pay | Admitting: Internal Medicine

## 2016-01-12 DIAGNOSIS — D571 Sickle-cell disease without crisis: Secondary | ICD-10-CM

## 2016-01-12 MED ORDER — OXYCODONE HCL 15 MG PO TABS: 15.0000 mg | ORAL_TABLET | ORAL | 0 refills | Status: DC | PRN

## 2016-01-12 MED FILL — oxyCODONE HCL 15 MG TABS: 15 | 15 days supply | Qty: 90 | Fill #0

## 2016-01-19 ENCOUNTER — Other Ambulatory Visit: Payer: Self-pay | Admitting: Internal Medicine

## 2016-01-19 ENCOUNTER — Telehealth: Payer: Self-pay

## 2016-01-19 DIAGNOSIS — G8929 Other chronic pain: Secondary | ICD-10-CM

## 2016-01-19 MED ORDER — METHADONE HCL 5 MG PO TABS: 5.0000 mg | ORAL_TABLET | Freq: Every day | ORAL | 0 refills | Status: DC

## 2016-01-19 MED FILL — METHADONE HCL 5 MG TABLET: 5 | 30 days supply | Qty: 30 | Fill #0

## 2016-01-19 NOTE — Telephone Encounter (Signed)
Not due for Oxycodone refill until 9/15. Thanks

## 2016-01-22 ENCOUNTER — Other Ambulatory Visit: Payer: Self-pay | Admitting: Family Medicine

## 2016-01-22 ENCOUNTER — Telehealth: Payer: Self-pay

## 2016-01-22 DIAGNOSIS — D571 Sickle-cell disease without crisis: Secondary | ICD-10-CM

## 2016-01-22 MED ORDER — OXYCODONE HCL 15 MG PO TABS: 15.0000 mg | ORAL_TABLET | ORAL | 0 refills | Status: DC | PRN

## 2016-01-22 NOTE — Telephone Encounter (Signed)
Refill request for oxycodone 15mg . LOV 12/15/2015. Please advise. Thanks!

## 2016-01-25 ENCOUNTER — Encounter (HOSPITAL_COMMUNITY): Payer: Self-pay | Admitting: *Deleted

## 2016-01-25 ENCOUNTER — Emergency Department (HOSPITAL_COMMUNITY)
Admission: EM | Admit: 2016-01-25 | Discharge: 2016-01-25 | Disposition: A | Discharge status: Home / Self Care | Payer: Medicare Other | Attending: Emergency Medicine | Admitting: Emergency Medicine

## 2016-01-25 DIAGNOSIS — Z87891 Personal history of nicotine dependence: Secondary | ICD-10-CM | POA: Insufficient documentation

## 2016-01-25 DIAGNOSIS — Z79899 Other long term (current) drug therapy: Secondary | ICD-10-CM | POA: Diagnosis not present

## 2016-01-25 DIAGNOSIS — Z7982 Long term (current) use of aspirin: Secondary | ICD-10-CM | POA: Diagnosis not present

## 2016-01-25 DIAGNOSIS — Z791 Long term (current) use of non-steroidal anti-inflammatories (NSAID): Secondary | ICD-10-CM | POA: Insufficient documentation

## 2016-01-25 DIAGNOSIS — D5700 Hb-SS disease with crisis, unspecified: Secondary | ICD-10-CM | POA: Diagnosis not present

## 2016-01-25 DIAGNOSIS — Z79891 Long term (current) use of opiate analgesic: Secondary | ICD-10-CM | POA: Insufficient documentation

## 2016-01-25 LAB — COMPREHENSIVE METABOLIC PANEL
ALT: 16 U/L (ref 14–54)
AST: 24 U/L (ref 15–41)
Albumin: 5.1 g/dL — ABNORMAL HIGH (ref 3.5–5.0)
Alkaline Phosphatase: 54 U/L (ref 38–126)
Anion gap: 7 (ref 5–15)
BILIRUBIN TOTAL: 2 mg/dL — AB (ref 0.3–1.2)
BUN: 7 mg/dL (ref 6–20)
CALCIUM: 9.2 mg/dL (ref 8.9–10.3)
CO2: 24 mmol/L (ref 22–32)
Chloride: 106 mmol/L (ref 101–111)
Creatinine, Ser: 0.61 mg/dL (ref 0.44–1.00)
Glucose, Bld: 90 mg/dL (ref 65–99)
POTASSIUM: 4 mmol/L (ref 3.5–5.1)
Sodium: 137 mmol/L (ref 135–145)
TOTAL PROTEIN: 8 g/dL (ref 6.5–8.1)

## 2016-01-25 LAB — CBC WITH DIFFERENTIAL/PLATELET
BASOS ABS: 0 10*3/uL (ref 0.0–0.1)
Basophils Relative: 0 %
EOS ABS: 1.2 10*3/uL — AB (ref 0.0–0.7)
Eosinophils Relative: 9 %
HCT: 24.7 % — ABNORMAL LOW (ref 36.0–46.0)
HEMOGLOBIN: 8.5 g/dL — AB (ref 12.0–15.0)
LYMPHS PCT: 22 %
Lymphs Abs: 3 10*3/uL (ref 0.7–4.0)
MCH: 29.4 pg (ref 26.0–34.0)
MCHC: 34.4 g/dL (ref 30.0–36.0)
MCV: 85.5 fL (ref 78.0–100.0)
MONOS PCT: 8 %
Monocytes Absolute: 1.1 10*3/uL — ABNORMAL HIGH (ref 0.1–1.0)
NEUTROS ABS: 8.4 10*3/uL — AB (ref 1.7–7.7)
NEUTROS PCT: 61 %
PLATELETS: 632 10*3/uL — AB (ref 150–400)
RBC: 2.89 MIL/uL — AB (ref 3.87–5.11)
RDW: 18.8 % — ABNORMAL HIGH (ref 11.5–15.5)
WBC: 13.7 10*3/uL — AB (ref 4.0–10.5)

## 2016-01-25 LAB — RETICULOCYTES
RBC.: 2.95 MIL/uL — AB (ref 3.87–5.11)
RETIC COUNT ABSOLUTE: 463.2 10*3/uL — AB (ref 19.0–186.0)
Retic Ct Pct: 15.7 % — ABNORMAL HIGH (ref 0.4–3.1)

## 2016-01-25 LAB — I-STAT BETA HCG BLOOD, ED (MC, WL, AP ONLY)

## 2016-01-25 MED ORDER — HYDROMORPHONE HCL 1 MG/ML IJ SOLN
2.0000 mg | Freq: Once | INTRAMUSCULAR | Status: AC
Start: 2016-01-25 — End: 2016-01-25
  Administered 2016-01-25: 2 mg via INTRAVENOUS
  Filled 2016-01-25: qty 2

## 2016-01-25 MED ORDER — HEPARIN SOD (PORK) LOCK FLUSH 100 UNIT/ML IV SOLN
500.0000 [IU] | Freq: Once | INTRAVENOUS | Status: AC
  Administered 2016-01-25: 500 [IU]
  Filled 2016-01-25: qty 5

## 2016-01-25 MED ORDER — SODIUM CHLORIDE 0.45 % IV SOLN
INTRAVENOUS | Status: DC
  Administered 2016-01-25: 20:00:00 via INTRAVENOUS

## 2016-01-25 MED ORDER — ONDANSETRON HCL 4 MG/2ML IJ SOLN
4.0000 mg | INTRAMUSCULAR | Status: DC | PRN
  Administered 2016-01-25: 4 mg via INTRAVENOUS
  Filled 2016-01-25: qty 2

## 2016-01-25 MED ORDER — DIPHENHYDRAMINE HCL 50 MG/ML IJ SOLN
25.0000 mg | Freq: Once | INTRAMUSCULAR | Status: AC
  Administered 2016-01-25: 25 mg via INTRAVENOUS
  Filled 2016-01-25: qty 1

## 2016-01-25 MED ORDER — HYDROMORPHONE HCL 1 MG/ML IJ SOLN
2.0000 mg | Freq: Once | INTRAMUSCULAR | Status: AC
  Administered 2016-01-25: 2 mg via INTRAVENOUS
  Filled 2016-01-25: qty 2

## 2016-01-25 MED ORDER — HYDROMORPHONE HCL 2 MG/ML IJ SOLN
2.0000 mg | Freq: Once | INTRAMUSCULAR | Status: AC
  Administered 2016-01-25: 2 mg via INTRAVENOUS
  Filled 2016-01-25: qty 1

## 2016-01-25 NOTE — ED Provider Notes (Signed)
[facility] DEPT Provider Note   CSN: [id number] Arrival date & time: 01/25/16  1526     History   Chief Complaint Chief Complaint  Patient presents with  . Sickle Cell Pain Crisis    HPI [name] is a 25 y.o. female.  25 year old female with history of sickle cell disease presents with her usual pain crisis. Pain is in her back as well as her legs. Denies any cough or congestion. No fever or chills. No urinary symptoms. States she's been compliant with her home meds and has not helped her symptoms. No focal weakness noted. Did have emesis 1 today. Patient states when she gets like this she can sometimes go home and did not require admission.      Past Medical History:  Diagnosis Date  . Blood transfusion    "lots"  . Blood transfusion without reported diagnosis   . Chronic back pain    "very severe; have knot in my back; from tight muscle; take RX and exercise for it"  . Depression 01/06/2011  . Exertional dyspnea    "sometimes"  . Genital HSV   . GERD (gastroesophageal reflux disease) 02/17/2011  . Migraines 11/08/11   "@ least twice/month"  . 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  . Mood swings ([place]) 11/08/11   "I go back and forth; real bad"  . Sickle cell anemia ([name])   . Sickle cell anemia with crisis ([place])   . Trichotillomania    h/o    Patient Active Problem List   Diagnosis Date Noted  . Sickle cell anemia with crisis ([place]) 01/05/2016  . Nausea & vomiting 12/27/2015  . Sickle cell crisis ([place]) 11/09/2015  . Sepsis ([place]) 11/09/2015  . Thrombocytosis ([place]) 11/09/2015  . Vaginal bleeding 11/09/2015  . Hyperbilirubinemia 11/09/2015  . Pyrexia   . Vaginal delivery 10/31/2015  . Hb-SS disease with crisis ([place])   . Sickle cell pain crisis ([place]) 10/26/2015  . Positive GBS test 10/18/2015  . Narcotic dependence ([place]) 10/06/2015  . 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 Maternal  substance abuse, antepartum 09/26/2015  . High risk for intrapartum complications, antepartum 08/12/2015  . Chronic pain 08/04/2015  . Cocaine use 07/24/2015  . Maternal sickle cell anemia affecting pregnancy, antepartum ([place]) 07/14/2015  . Anemia of chronic disease   . Sickle cell anemia with pain ([place]) 05/28/2013  . Hb-SS disease without crisis ([place]) 02/26/2013  . Migraines 11/08/2011  . GERD (gastroesophageal reflux disease) 02/17/2011  . Depression 01/06/2011  . Sickle cell disease ([place]) 01/08/2009  . 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 CHOLECYSTECTOMY  05/2010  . DILATION AND CURETTAGE OF UTERUS  02/20/11   S/P miscarriage  . IR GENERIC HISTORICAL  12/23/2015   IR FLUORO GUIDE CV LINE RIGHT 12/23/2015 Jacqulynn Cadet, MD [facility]  . IR GENERIC HISTORICAL  12/23/2015   IR US GUIDE VASC ACCESS RIGHT 12/23/2015 Jacqulynn Cadet, MD [facility]    OB History    Gravida Para Term Preterm AB Living   3 2 2   1 2    SAB TAB Ectopic Multiple Live Births   1     0 2      Obstetric Comments   Miscarried in October 2012 at about 7 weeks       Home Medications    Prior to Admission medications   Medication Sig Start Date End Date Taking? Authorizing Provider  acetaminophen (TYLENOL) 500 MG tablet Take 1,000 mg by mouth daily as needed for moderate pain.   Yes Historical Provider, MD  aspirin-acetaminophen-caffeine (EXCEDRIN MIGRAINE) [phone] MG tablet Take 2 tablets by mouth every 6 (six) hours as needed for headache.    Yes Historical Provider, MD  cyclobenzaprine (FLEXERIL) 10 MG tablet Take 1 tablet (10 mg total) by mouth 3 (three) times daily as needed for muscle spasms. 01/09/16  Yes Elwyn Reach, MD  hydroxyurea (HYDREA) 500 MG capsule Take 1 capsule (500 mg total) by mouth daily. May take with food to minimize GI side effects. 12/15/15  Yes Tresa Garter, MD  ibuprofen (ADVIL,MOTRIN) 600 MG tablet Take 600 mg by  mouth every 6 (six) hours as needed for mild pain or moderate pain.   Yes Historical Provider, MD  methadone (DOLOPHINE) 5 MG tablet Take 1 tablet (5 mg total) by mouth daily. 01/19/16  Yes Olugbemiga Essie Christine, MD  oxyCODONE (ROXICODONE) 15 MG immediate release tablet Take 1 tablet (15 mg total) by mouth every 4 (four) hours as needed for pain. 01/22/16 02/06/16 Yes Micheline Chapman, NP  valACYclovir (VALTREX) 500 MG tablet Take 1,000 mg by mouth daily.    Yes Historical Provider, MD    Family History Family History  Problem Relation Age of Onset  . Sickle cell trait Mother   . Sickle cell trait Father   . Diabetes Maternal Grandmother   . Diabetes Paternal Grandmother   . Hypertension Paternal Grandmother   . Diabetes Maternal Grandfather     Social History Social History  Substance Use Topics  . Smoking status: Former Smoker    Packs/day: 0.25    Years: 1.00    Types: Cigarettes    Quit date: 03/25/2013  . Smokeless tobacco: Never Used  . Alcohol use No     Comment: pt states she quit marijuan in May 2013. Rare ETOH, + cigarettes.  She is enrolled in school     Allergies   Food and Latex   Review of Systems Review of Systems  All other systems reviewed and are negative.    Physical Exam Updated Vital Signs BP 133/80 (BP Location: Left Arm)   Pulse 110   Temp 98.8 F (37.1 C) (Oral)   Resp 20   Wt 73.9 kg   LMP 01/03/2016   SpO2 96%   BMI 24.07 kg/m   Physical Exam  Constitutional: She is oriented to person, place, and time. She appears well-developed and well-nourished.  Non-toxic appearance. No distress.  HENT:  Head: Normocephalic and atraumatic.  Eyes: Conjunctivae, EOM and lids are normal. Pupils are equal, round, and reactive to light.  Neck: Normal range of motion. Neck supple. No tracheal deviation present. No thyroid mass present.  Cardiovascular: Normal rate, regular rhythm and normal heart sounds.  Exam reveals no gallop.   No murmur  heard. Pulmonary/Chest: Effort normal and breath sounds normal. No stridor. No respiratory distress. She has no decreased breath sounds. She has no wheezes. She has no rhonchi. She has no rales.  Abdominal: Soft. Normal appearance and bowel sounds are normal. She exhibits no distension. There is no tenderness. There is no rebound and no CVA tenderness.  Musculoskeletal: Normal range of motion. She exhibits no edema or tenderness.  Neurological: She is alert and oriented to person, place, and time. She has normal strength. No cranial nerve deficit or sensory deficit. GCS eye subscore is 4. GCS verbal subscore is 5. GCS motor subscore is 6.  Skin: Skin is warm and dry. No abrasion and no rash noted.  Psychiatric: She has a normal mood and affect. Her speech is normal and behavior is normal.  Nursing note and vitals reviewed.    ED Treatments / Results  Labs (all labs ordered are listed, but only abnormal results are displayed) Labs Reviewed  COMPREHENSIVE METABOLIC PANEL - Abnormal; Notable for the following:       Result Value   Albumin 5.1 (*)    Total Bilirubin 2.0 (*)    All other components within normal limits  CBC WITH DIFFERENTIAL/PLATELET - Abnormal; Notable for the following:    WBC 13.7 (*)    RBC 2.89 (*)    Hemoglobin 8.5 (*)    HCT 24.7 (*)    RDW 18.8 (*)    Platelets 632 (*)    Neutro Abs 8.4 (*)    Monocytes Absolute 1.1 (*)    Eosinophils Absolute 1.2 (*)    All other components within normal limits  RETICULOCYTES - Abnormal; Notable for the following:    Retic Ct Pct 15.7 (*)    RBC. 2.95 (*)    Retic Count, Manual 463.2 (*)    All other components within normal limits  I-STAT BETA HCG BLOOD, ED ([facility], [facility], [facility] ONLY)    EKG  EKG Interpretation None       Radiology No results found.  Procedures Procedures (including critical care time)  Medications Ordered in ED Medications  0.45 % sodium chloride infusion ( Intravenous New Bag/Given 01/25/16 2017)   ondansetron (ZOFRAN) injection 4 mg (4 mg Intravenous Given 01/25/16 2017)  diphenhydrAMINE (BENADRYL) injection 25 mg (25 mg Intravenous Given 01/25/16 2017)  HYDROmorphone (DILAUDID) injection 2 mg (2 mg Intravenous Given 01/25/16 2017)    Followed by  HYDROmorphone (DILAUDID) injection 2 mg (2 mg Intravenous Given 01/25/16 2112)  diphenhydrAMINE (BENADRYL) injection 25 mg (25 mg Intravenous Given 01/25/16 2133)  HYDROmorphone (DILAUDID) injection 2 mg (2 mg Intravenous Given 01/25/16 2213)     Initial Impression / Assessment and Plan / ED Course  I have reviewed the triage vital signs and the nursing notes.  Pertinent labs & imaging results that were available during my care of the patient were reviewed by me and considered in my medical decision making (see chart for details).  Clinical Course    Patient's emesis has been controlled here. They said she feels much better after 3 doses of hydromorphone. We'll follow-up with the sickle cell clinic tomorrow  Final Clinical Impressions(s) / ED Diagnoses   Final diagnoses:  None    New Prescriptions New Prescriptions   No medications on file     Lacretia Leigh, MD 01/25/16 2237

## 2016-01-25 NOTE — ED Triage Notes (Signed)
Pt with sickle cell disease complains of generalized body aches and vomiting today. Pt took tylenol and 15mg  oxycodone before arriving to ED.

## 2016-01-26 MED FILL — oxyCODONE HCL 15 MG TABS: 15 | 15 days supply | Qty: 90 | Fill #0

## 2016-01-29 DIAGNOSIS — Z832 Family history of diseases of the blood and blood-forming organs and certain disorders involving the immune mechanism: Secondary | ICD-10-CM | POA: Diagnosis not present

## 2016-01-29 DIAGNOSIS — M549 Dorsalgia, unspecified: Secondary | ICD-10-CM | POA: Diagnosis not present

## 2016-01-29 DIAGNOSIS — D5700 Hb-SS disease with crisis, unspecified: Principal | ICD-10-CM | POA: Diagnosis present

## 2016-01-29 DIAGNOSIS — Z79891 Long term (current) use of opiate analgesic: Secondary | ICD-10-CM | POA: Diagnosis not present

## 2016-01-29 DIAGNOSIS — D638 Anemia in other chronic diseases classified elsewhere: Secondary | ICD-10-CM | POA: Diagnosis not present

## 2016-01-29 DIAGNOSIS — E876 Hypokalemia: Secondary | ICD-10-CM | POA: Diagnosis not present

## 2016-01-29 DIAGNOSIS — R000 Tachycardia, unspecified: Secondary | ICD-10-CM | POA: Diagnosis present

## 2016-01-29 DIAGNOSIS — Z91040 Latex allergy status: Secondary | ICD-10-CM

## 2016-01-29 DIAGNOSIS — R112 Nausea with vomiting, unspecified: Secondary | ICD-10-CM | POA: Diagnosis not present

## 2016-01-29 DIAGNOSIS — D72829 Elevated white blood cell count, unspecified: Secondary | ICD-10-CM | POA: Diagnosis present

## 2016-01-29 DIAGNOSIS — Z79899 Other long term (current) drug therapy: Secondary | ICD-10-CM

## 2016-01-29 DIAGNOSIS — K219 Gastro-esophageal reflux disease without esophagitis: Secondary | ICD-10-CM | POA: Diagnosis not present

## 2016-01-29 DIAGNOSIS — E860 Dehydration: Secondary | ICD-10-CM | POA: Diagnosis present

## 2016-01-29 DIAGNOSIS — Z8249 Family history of ischemic heart disease and other diseases of the circulatory system: Secondary | ICD-10-CM | POA: Diagnosis not present

## 2016-01-29 DIAGNOSIS — Z87891 Personal history of nicotine dependence: Secondary | ICD-10-CM | POA: Diagnosis not present

## 2016-01-29 DIAGNOSIS — G8929 Other chronic pain: Secondary | ICD-10-CM | POA: Diagnosis not present

## 2016-01-29 DIAGNOSIS — Z833 Family history of diabetes mellitus: Secondary | ICD-10-CM

## 2016-01-30 ENCOUNTER — Encounter (HOSPITAL_COMMUNITY): Payer: Self-pay | Admitting: Emergency Medicine

## 2016-01-30 ENCOUNTER — Inpatient Hospital Stay (HOSPITAL_COMMUNITY)
Admission: EM | Admit: 2016-01-30 | Discharge: 2016-02-05 | DRG: 812 | Disposition: A | Discharge status: Home / Self Care | Payer: Medicare Other | Attending: Internal Medicine | Admitting: Internal Medicine

## 2016-01-30 DIAGNOSIS — M549 Dorsalgia, unspecified: Secondary | ICD-10-CM

## 2016-01-30 DIAGNOSIS — E876 Hypokalemia: Secondary | ICD-10-CM | POA: Diagnosis present

## 2016-01-30 DIAGNOSIS — G8929 Other chronic pain: Secondary | ICD-10-CM

## 2016-01-30 DIAGNOSIS — D571 Sickle-cell disease without crisis: Secondary | ICD-10-CM

## 2016-01-30 DIAGNOSIS — D5700 Hb-SS disease with crisis, unspecified: Secondary | ICD-10-CM | POA: Diagnosis present

## 2016-01-30 DIAGNOSIS — R112 Nausea with vomiting, unspecified: Secondary | ICD-10-CM | POA: Diagnosis present

## 2016-01-30 DIAGNOSIS — K219 Gastro-esophageal reflux disease without esophagitis: Secondary | ICD-10-CM

## 2016-01-30 LAB — CBC WITH DIFFERENTIAL/PLATELET
BASOS ABS: 0.1 10*3/uL (ref 0.0–0.1)
Basophils Relative: 1 %
Eosinophils Absolute: 1 10*3/uL — ABNORMAL HIGH (ref 0.0–0.7)
Eosinophils Relative: 9 %
HCT: 25.2 % — ABNORMAL LOW (ref 36.0–46.0)
HEMOGLOBIN: 8.7 g/dL — AB (ref 12.0–15.0)
LYMPHS ABS: 2.7 10*3/uL (ref 0.7–4.0)
LYMPHS PCT: 24 %
MCH: 29.2 pg (ref 26.0–34.0)
MCHC: 34.5 g/dL (ref 30.0–36.0)
MCV: 84.6 fL (ref 78.0–100.0)
Monocytes Absolute: 1.2 10*3/uL — ABNORMAL HIGH (ref 0.1–1.0)
Monocytes Relative: 11 %
NEUTROS PCT: 55 %
Neutro Abs: 6.2 10*3/uL (ref 1.7–7.7)
Platelets: 505 10*3/uL — ABNORMAL HIGH (ref 150–400)
RBC: 2.98 MIL/uL — AB (ref 3.87–5.11)
RDW: 19.8 % — ABNORMAL HIGH (ref 11.5–15.5)
WBC: 11.2 10*3/uL — AB (ref 4.0–10.5)

## 2016-01-30 LAB — COMPREHENSIVE METABOLIC PANEL
ALT: 20 U/L (ref 14–54)
ANION GAP: 6 (ref 5–15)
AST: 22 U/L (ref 15–41)
Albumin: 4.4 g/dL (ref 3.5–5.0)
Alkaline Phosphatase: 54 U/L (ref 38–126)
BUN: 7 mg/dL (ref 6–20)
CHLORIDE: 108 mmol/L (ref 101–111)
CO2: 24 mmol/L (ref 22–32)
Calcium: 8.8 mg/dL — ABNORMAL LOW (ref 8.9–10.3)
Creatinine, Ser: 0.72 mg/dL (ref 0.44–1.00)
Glucose, Bld: 95 mg/dL (ref 65–99)
POTASSIUM: 3.4 mmol/L — AB (ref 3.5–5.1)
Sodium: 138 mmol/L (ref 135–145)
TOTAL PROTEIN: 7.3 g/dL (ref 6.5–8.1)
Total Bilirubin: 2.6 mg/dL — ABNORMAL HIGH (ref 0.3–1.2)

## 2016-01-30 LAB — RETICULOCYTES
RBC.: 2.98 MIL/uL — ABNORMAL LOW (ref 3.87–5.11)
RETIC COUNT ABSOLUTE: 631.8 10*3/uL — AB (ref 19.0–186.0)
RETIC CT PCT: 21.2 % — AB (ref 0.4–3.1)

## 2016-01-30 LAB — HCG, QUANTITATIVE, PREGNANCY: HCG, BETA CHAIN, QUANT, S: 1 m[IU]/mL (ref <5)

## 2016-01-30 LAB — LIPASE, BLOOD: LIPASE: 31 U/L (ref 11–51)

## 2016-01-30 MED ORDER — HYDROMORPHONE HCL 2 MG/ML IJ SOLN
0.0250 mg/kg | INTRAMUSCULAR | Status: AC
  Administered 2016-01-30: 1.8 mg via INTRAVENOUS
  Filled 2016-01-30: qty 1

## 2016-01-30 MED ORDER — HYDROMORPHONE 1 MG/ML IV SOLN
INTRAVENOUS | Status: DC
  Administered 2016-01-30: 10:00:00 via INTRAVENOUS
  Administered 2016-01-30: 7 mg via INTRAVENOUS
  Administered 2016-01-30: 5.9 mg via INTRAVENOUS
  Administered 2016-01-30: 23:00:00 via INTRAVENOUS
  Administered 2016-01-30: 2.8 mg via INTRAVENOUS
  Administered 2016-01-30: 8.27 mg via INTRAVENOUS
  Administered 2016-01-31: 1.4 mg via INTRAVENOUS
  Administered 2016-01-31: 6.3 mg via INTRAVENOUS
  Administered 2016-01-31 (×2): 4.2 mg via INTRAVENOUS
  Administered 2016-01-31: 0.7 mg via INTRAVENOUS
  Administered 2016-01-31: 15:00:00 via INTRAVENOUS
  Administered 2016-01-31: 9.8 mg via INTRAVENOUS
  Administered 2016-02-01: 11.2 mg via INTRAVENOUS
  Administered 2016-02-01: 4.9 mg via INTRAVENOUS
  Administered 2016-02-01: 2.1 mg via INTRAVENOUS
  Administered 2016-02-01: 8.4 mg via INTRAVENOUS
  Administered 2016-02-01: 4.6 mg via INTRAVENOUS
  Administered 2016-02-01: 14:00:00 via INTRAVENOUS
  Administered 2016-02-01: 7 mg via INTRAVENOUS
  Filled 2016-01-30 (×4): qty 25

## 2016-01-30 MED ORDER — DIPHENHYDRAMINE HCL 50 MG/ML IJ SOLN
25.0000 mg | Freq: Once | INTRAMUSCULAR | Status: AC
  Administered 2016-01-30: 25 mg via INTRAVENOUS
  Filled 2016-01-30: qty 1

## 2016-01-30 MED ORDER — DIPHENHYDRAMINE HCL 12.5 MG/5ML PO ELIX
12.5000 mg | ORAL_SOLUTION | Freq: Four times a day (QID) | ORAL | Status: DC | PRN
  Filled 2016-01-30: qty 5

## 2016-01-30 MED ORDER — HYDROXYUREA 500 MG PO CAPS
500.0000 mg | ORAL_CAPSULE | Freq: Every day | ORAL | Status: DC
  Administered 2016-01-30 – 2016-02-01 (×3): 500 mg via ORAL
  Filled 2016-01-30 (×3): qty 1

## 2016-01-30 MED ORDER — INFLUENZA VAC SPLIT QUAD 0.5 ML IM SUSY
0.5000 mL | PREFILLED_SYRINGE | INTRAMUSCULAR | Status: DC
  Filled 2016-01-30 (×2): qty 0.5

## 2016-01-30 MED ORDER — KETOROLAC TROMETHAMINE 30 MG/ML IJ SOLN
30.0000 mg | INTRAMUSCULAR | Status: AC
  Administered 2016-01-30: 30 mg via INTRAVENOUS
  Filled 2016-01-30: qty 1

## 2016-01-30 MED ORDER — POTASSIUM CHLORIDE CRYS ER 20 MEQ PO TBCR
20.0000 meq | EXTENDED_RELEASE_TABLET | Freq: Once | ORAL | Status: AC
Start: 2016-01-30 — End: 2016-01-30
  Administered 2016-01-30: 20 meq via ORAL
  Filled 2016-01-30: qty 1

## 2016-01-30 MED ORDER — METHADONE HCL 5 MG PO TABS: 5.0000 mg | ORAL_TABLET | Freq: Every day | ORAL | Status: DC

## 2016-01-30 MED ORDER — VALACYCLOVIR HCL 500 MG PO TABS
1000.0000 mg | ORAL_TABLET | Freq: Every day | ORAL | Status: DC
  Administered 2016-01-30 – 2016-02-05 (×7): 1000 mg via ORAL
  Filled 2016-01-30 (×7): qty 2

## 2016-01-30 MED ORDER — ONDANSETRON HCL 4 MG/2ML IJ SOLN
4.0000 mg | INTRAMUSCULAR | Status: DC | PRN
  Administered 2016-01-30: 4 mg via INTRAVENOUS
  Filled 2016-01-30: qty 2

## 2016-01-30 MED ORDER — ENOXAPARIN SODIUM 40 MG/0.4ML ~~LOC~~ SOLN
40.0000 mg | SUBCUTANEOUS | Status: DC
  Filled 2016-01-30 (×4): qty 0.4

## 2016-01-30 MED ORDER — DIPHENHYDRAMINE HCL 50 MG/ML IJ SOLN: 12.5000 mg | Freq: Four times a day (QID) | INTRAMUSCULAR | Status: DC | PRN

## 2016-01-30 MED ORDER — POTASSIUM CHLORIDE 20 MEQ/15ML (10%) PO SOLN
20.0000 meq | Freq: Once | ORAL | Status: DC
Start: 2016-01-30 — End: 2016-01-30
  Filled 2016-01-30: qty 15

## 2016-01-30 MED ORDER — SENNOSIDES-DOCUSATE SODIUM 8.6-50 MG PO TABS
1.0000 | ORAL_TABLET | Freq: Two times a day (BID) | ORAL | Status: DC
  Administered 2016-01-30 – 2016-02-05 (×12): 1 via ORAL
  Filled 2016-01-30 (×12): qty 1

## 2016-01-30 MED ORDER — SODIUM CHLORIDE 0.9 % IV BOLUS (SEPSIS): 1000.0000 mL | Freq: Once | INTRAVENOUS | Status: DC

## 2016-01-30 MED ORDER — DEXTROSE-NACL 5-0.45 % IV SOLN
INTRAVENOUS | Status: AC
  Administered 2016-01-30 (×2): via INTRAVENOUS

## 2016-01-30 MED ORDER — HYDROMORPHONE HCL 2 MG/ML IJ SOLN: 0.0250 mg/kg | INTRAMUSCULAR | Status: AC

## 2016-01-30 MED ORDER — ACETAMINOPHEN 500 MG PO TABS: 1000.0000 mg | ORAL_TABLET | Freq: Every day | ORAL | Status: DC | PRN

## 2016-01-30 MED ORDER — CYCLOBENZAPRINE HCL 10 MG PO TABS
10.0000 mg | ORAL_TABLET | Freq: Three times a day (TID) | ORAL | Status: DC | PRN
Start: 2016-01-30 — End: 2016-02-05
  Administered 2016-02-04 – 2016-02-05 (×2): 10 mg via ORAL
  Filled 2016-01-30 (×2): qty 1

## 2016-01-30 MED ORDER — HYDROMORPHONE HCL 2 MG/ML IJ SOLN
2.0000 mg | INTRAMUSCULAR | Status: DC | PRN
  Administered 2016-01-30 – 2016-02-01 (×13): 2 mg via INTRAVENOUS
  Filled 2016-01-30 (×13): qty 1

## 2016-01-30 MED ORDER — ONDANSETRON HCL 4 MG/2ML IJ SOLN: 4.0000 mg | Freq: Three times a day (TID) | INTRAMUSCULAR | Status: DC | PRN

## 2016-01-30 MED ORDER — HYDROMORPHONE HCL 2 MG/ML IJ SOLN
2.3000 mg | INTRAMUSCULAR | Status: AC
  Administered 2016-01-30: 2.3 mg via INTRAVENOUS
  Filled 2016-01-30: qty 2

## 2016-01-30 MED ORDER — NALOXONE HCL 0.4 MG/ML IJ SOLN: 0.4000 mg | INTRAMUSCULAR | Status: DC | PRN

## 2016-01-30 MED ORDER — POLYETHYLENE GLYCOL 3350 17 G PO PACK: 17.0000 g | PACK | Freq: Every day | ORAL | Status: DC | PRN

## 2016-01-30 MED ORDER — SODIUM CHLORIDE 0.9% FLUSH: 9.0000 mL | INTRAVENOUS | Status: DC | PRN

## 2016-01-30 MED ORDER — HYDROMORPHONE HCL 2 MG/ML IJ SOLN
2.7000 mg | INTRAMUSCULAR | Status: AC
  Administered 2016-01-30: 2.7 mg via INTRAVENOUS
  Filled 2016-01-30: qty 2

## 2016-01-30 MED ORDER — FAMOTIDINE 20 MG PO TABS: 20.0000 mg | ORAL_TABLET | Freq: Every day | ORAL | Status: DC | PRN

## 2016-01-30 MED ORDER — ASPIRIN-ACETAMINOPHEN-CAFFEINE 250-250-65 MG PO TABS: 2.0000 | ORAL_TABLET | Freq: Four times a day (QID) | ORAL | Status: DC | PRN

## 2016-01-30 MED ORDER — DEXTROSE-NACL 5-0.45 % IV SOLN
INTRAVENOUS | Status: DC
  Administered 2016-01-30: 150 mL/h via INTRAVENOUS

## 2016-01-30 MED ORDER — IBUPROFEN 200 MG PO TABS: 600.0000 mg | ORAL_TABLET | Freq: Four times a day (QID) | ORAL | Status: DC | PRN

## 2016-01-30 NOTE — ED Notes (Signed)
MD at bedside. 

## 2016-01-30 NOTE — ED Triage Notes (Signed)
Pt states has been having generalized aching pain over her entire back with nausea/vomiting x 2 days.

## 2016-01-30 NOTE — ED Notes (Signed)
Pt. Informs me that she is still having significant back pain. I notify Dr. Roxanne Mins of this and he places hospitalist consult.  Pt. Is oriented x 4 with clear speech.

## 2016-01-30 NOTE — H&P (Addendum)
History and Physical    [name] [id number] DOB: 29-Dec-1990 DOA: 01/30/2016  Referring MD/NP/PA:   PCP: Angelica Chessman, MD   Patient coming from:  The patient is coming from home.  At baseline, pt is independent for most of ADL.   Chief Complaint: Worsening back pain, nausea, vomiting  HPI: [name] is a 25 y.o. female with medical history significant of sickle cell disease, chronic back pain, GERD, depression, who presents with worsening back pain, nausea, vomiting.  Patient reports that he has been having nauseas and vomiting for 3days. He vomited 1 to 3 times each day with blood in the vomitus. Patient does not have diarrhea or abdominal pain. No symptoms of UTI. She states that her pain is also getting worse. She has beenpain all over, but worse on both side of the back. The back pain is constant, 10 out of 10 severity, nonradiating. No leg numbness or weakness. No incontinence of bladder or bowel movement. It is not aggravated or alleviated anginal factors. Patient denies chest pain, shortness, cough, fever or chills.  ED Course: pt was found to have WBC 11.2, hemoglobin 8.7, temperature normal, tachycardia, but no tachypnea, potassium 3.4, creatinine normal. Patient is placed on MedSurg bed for observation.  Review of Systems:   General: no fevers, chills, no changes in body weight, has poor appetite, has fatigue HEENT: no blurry vision, hearing changes or sore throat Respiratory: no dyspnea, coughing, wheezing CV: no chest pain, no palpitations GI: has nausea, vomiting, no abdominal pain, diarrhea, constipation GU: no dysuria, burning on urination, increased urinary frequency, hematuria  Ext: no leg edema Neuro: no unilateral weakness, numbness, or tingling, no vision change or hearing loss Skin: no rash, no skin tear. MSK: has severe pain on both side of back Heme: No easy bruising.  Travel history: No recent long distant travel.  Allergy:    Allergies  Allergen Reactions  . Food Hives and Other (See Comments)    Pt states that she is allergic to carrots.   . Latex Rash    Past Medical History:  Diagnosis Date  . Blood transfusion    "lots"  . Blood transfusion without reported diagnosis   . Chronic back pain    "very severe; have knot in my back; from tight muscle; take RX and exercise for it"  . Depression 01/06/2011  . Exertional dyspnea    "sometimes"  . Genital HSV   . GERD (gastroesophageal reflux disease) 02/17/2011  . Migraines 11/08/11   "@ least twice/month"  . 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  . Mood swings ([place]) 11/08/11   "I go back and forth; real bad"  . Sickle cell anemia ([name])   . Sickle cell anemia with crisis ([place])   . Trichotillomania    h/o    Past Surgical History:  Procedure Laterality Date  . CHOLECYSTECTOMY  05/2010  . DILATION AND CURETTAGE OF UTERUS  02/20/11   S/P miscarriage  . IR GENERIC HISTORICAL  12/23/2015   IR FLUORO GUIDE CV LINE RIGHT 12/23/2015 Jacqulynn Cadet, MD [facility]  . IR GENERIC HISTORICAL  12/23/2015   IR US GUIDE VASC ACCESS RIGHT 12/23/2015 Jacqulynn Cadet, MD [facility]    Social History:  reports that she quit smoking about 2 years ago. Her smoking use included Cigarettes. She has a 0.25 pack-year smoking history. She has never used smokeless tobacco. She reports that she uses drugs, including Other-see comments. She reports that she does not  drink alcohol.  Family History:  Family History  Problem Relation Age of Onset  . Sickle cell trait Mother   . Sickle cell trait Father   . Diabetes Maternal Grandmother   . Diabetes Paternal Grandmother   . Hypertension Paternal Grandmother   . Diabetes Maternal Grandfather      Prior to Admission medications   Medication Sig Start Date End Date Taking? Authorizing Provider  acetaminophen (TYLENOL) 500 MG tablet Take 1,000 mg by mouth daily as needed for moderate pain.   Yes Historical  Provider, MD  aspirin-acetaminophen-caffeine (EXCEDRIN MIGRAINE) [phone] MG tablet Take 2 tablets by mouth every 6 (six) hours as needed for headache.    Yes Historical Provider, MD  cyclobenzaprine (FLEXERIL) 10 MG tablet Take 1 tablet (10 mg total) by mouth 3 (three) times daily as needed for muscle spasms. 01/09/16  Yes Elwyn Reach, MD  hydroxyurea (HYDREA) 500 MG capsule Take 1 capsule (500 mg total) by mouth daily. May take with food to minimize GI side effects. 12/15/15  Yes Tresa Garter, MD  ibuprofen (ADVIL,MOTRIN) 600 MG tablet Take 600 mg by mouth every 6 (six) hours as needed for mild pain or moderate pain.   Yes Historical Provider, MD  methadone (DOLOPHINE) 5 MG tablet Take 1 tablet (5 mg total) by mouth daily. 01/19/16  Yes Olugbemiga Essie Christine, MD  oxyCODONE (ROXICODONE) 15 MG immediate release tablet Take 1 tablet (15 mg total) by mouth every 4 (four) hours as needed for pain. 01/22/16 02/06/16 Yes Micheline Chapman, NP  valACYclovir (VALTREX) 500 MG tablet Take 1,000 mg by mouth daily.    Yes Historical Provider, MD    Physical Exam: Vitals:   01/30/16 0450 01/30/16 0704 01/30/16 0820 01/30/16 0851  BP: 106/60 112/65 115/87 (!) 104/51  Pulse: 84 101 97 89  Resp: 20 18 18 18   Temp:  98.4 F (36.9 C) 98.5 F (36.9 C) 99 F (37.2 C)  TempSrc:  Axillary Oral Oral  SpO2: 95% 96% 98% 92%   General: Not in acute distress. Dry mucus membrane. HEENT:       Eyes: PERRL, EOMI, no scleral icterus.       ENT: No discharge from the ears and nose, no pharynx injection, no tonsillar enlargement.        Neck: No JVD, no bruit, no mass felt. Heme: No neck lymph node enlargement. Cardiac: S1/S2, RRR, No murmurs, No gallops or rubs. Respiratory:  No rales, wheezing, rhonchi or rubs. GI: Soft, nondistended, nontender, no rebound pain, no organomegaly, BS present. GU: No hematuria Ext: No pitting leg edema bilaterally. 2+DP/PT pulse bilaterally. Musculoskeletal: tenderness over  both sides of back Skin: No rashes.  Neuro: Alert, oriented X3, cranial nerves II-XII grossly intact, moves all extremities normally. Psych: Patient is not psychotic, no suicidal or hemocidal ideation.  Labs on Admission: I have personally reviewed following labs and imaging studies  CBC:  Recent Labs Lab 01/25/16 1745 01/30/16 0416  WBC 13.7* 11.2*  NEUTROABS 8.4* 6.2  HGB 8.5* 8.7*  HCT 24.7* 25.2*  MCV 85.5 84.6  PLT 632* [id number]*   Basic Metabolic Panel:  Recent Labs Lab 01/25/16 1745 01/30/16 0416  NA 137 138  K 4.0 3.4*  CL 106 108  CO2 24 24  GLUCOSE 90 95  BUN 7 7  CREATININE 0.61 0.72  CALCIUM 9.2 8.8*   GFR: Estimated Creatinine Clearance: 113.3 mL/min (by C-G formula based on SCr of 0.72 mg/dL). Liver Function Tests:  Recent Labs  Lab 01/25/16 1745 01/30/16 0416  AST 24 22  ALT 16 20  ALKPHOS 54 54  BILITOT 2.0* 2.6*  PROT 8.0 7.3  ALBUMIN 5.1* 4.4   No results for input(s): LIPASE, AMYLASE in the last 168 hours. No results for input(s): AMMONIA in the last 168 hours. Coagulation Profile: No results for input(s): INR, PROTIME in the last 168 hours. Cardiac Enzymes: No results for input(s): CKTOTAL, CKMB, CKMBINDEX, TROPONINI in the last 168 hours. BNP (last 3 results) No results for input(s): PROBNP in the last 8760 hours. HbA1C: No results for input(s): HGBA1C in the last 72 hours. CBG: No results for input(s): GLUCAP in the last 168 hours. Lipid Profile: No results for input(s): CHOL, HDL, LDLCALC, TRIG, CHOLHDL, LDLDIRECT in the last 72 hours. Thyroid Function Tests: No results for input(s): TSH, T4TOTAL, FREET4, T3FREE, THYROIDAB in the last 72 hours. Anemia Panel:  Recent Labs  01/30/16 0416  RETICCTPCT 21.2*   Urine analysis:    Component Value Date/Time   COLORURINE YELLOW 11/08/2015 2046   APPEARANCEUR CLEAR 11/08/2015 2046   APPEARANCEUR Clear 05/06/2012 1546   LABSPEC 1.015 11/08/2015 2046   LABSPEC 1.011 05/06/2012 1546     PHURINE 6.5 11/08/2015 2046   GLUCOSEU NEGATIVE 11/08/2015 2046   GLUCOSEU Negative 05/06/2012 1546   HGBUR MODERATE (A) 11/08/2015 2046   HGBUR negative 01/08/2009 1329   BILIRUBINUR NEGATIVE 11/08/2015 2046   BILIRUBINUR Negative 05/06/2012 [place] 11/08/2015 2046   PROTEINUR NEGATIVE 11/08/2015 2046   UROBILINOGEN 2.0 (H) 10/26/2015 0817   NITRITE NEGATIVE 11/08/2015 2046   LEUKOCYTESUR TRACE (A) 11/08/2015 2046   LEUKOCYTESUR Negative 05/06/2012 1546   Sepsis Labs: @LABRCNTIP (procalcitonin:4,lacticidven:4) )No results found for this or any previous visit (from the past 240 hour(s)).   Radiological Exams on Admission: No results found.   EKG:   Reviewed independently, QTC 525, sinus rhythm, Q-wave in lead 3, mild T-wave inversion in V1-V 2   Assessment/Plan Principal Problem:   Sickle cell pain crisis ([name]) Active Problems:   Sickle cell disease ([name])   Hypokalemia   Nausea & vomiting   Sickle cell pain crisis [facility]): Hemoglobin stable, 8.5 on 01/25/16-->8.7 today,does not need transfusion at this moment. No fever. No indication for antibiotics. Patient is clinically dehydrated, which likely triggered his pain.  -Will place on med-surg bed for obs. -Start PCA protocol for pain: -Start high dose PCA protocol for pain: bolus dose 0.7 mg, lockout interval 10 min and one hour dose limit at 4.2 mg.  -continue home Ibuprofen and Methadone -continue hydroxyurea -Benadryl for itch -IVF: 1L NS bolus and then D5-1/2NS at 120 cc/h  Addendum: EKG showed QTc 525 -will d/c Zofran and methodone -repeat ekg in AM  Nausea & vomiting: Etiology is not clear. May be due to viral gastritis. -Zofran for nausea -Check lipase -IV fluids as above  Hypokalemia: K= 3.4 on admission. - Repleted  Leukocytosis: WBC 11.2 no signs of infection. Likely due to stress induced to demargination. -follow up by CBC  DVT ppx:  SQ Lovenox Code Status: Full code Family  Communication: None at bed side. Disposition Plan:  Anticipate discharge back to previous home environment Consults called:  none Admission status: medical floor/obs   Date of Service 01/30/2016    Ivor Costa [facility] Pager [phone]  If 7PM-7AM, please contact night-coverage [email or web address] Password [email or web address] 01/30/2016, 9:03 AM

## 2016-01-30 NOTE — ED Provider Notes (Addendum)
[name] DEPT Provider Note   CSN: [id number] Arrival date & time: 01/29/16  2316  By signing my name below, I, Dolores Hoose, attest that this documentation has been prepared under the direction and in the presence of Delora Fuel, MD . Electronically Signed: Dolores Hoose, Scribe. 01/30/2016. 2:58 AM.  History   Chief Complaint Chief Complaint  Patient presents with  . Back Pain   The history is provided by the patient. No language interpreter was used.     HPI Comments:  [name] is a 25 y.o. female with PMHx of sickle cell who presents to the Emergency Department complaining of sudden-onset unchanged back pain onset 2 days ago. Pt notes that the pain is typical of her usual pain crisis. She endorses associated vomiting (x2 yesterday) and nausea. She denies any fever, chills, diaphoresis or abdominal pain.   Past Medical History:  Diagnosis Date  . Blood transfusion    "lots"  . Blood transfusion without reported diagnosis   . Chronic back pain    "very severe; have knot in my back; from tight muscle; take RX and exercise for it"  . Depression 01/06/2011  . Exertional dyspnea    "sometimes"  . Genital HSV   . GERD (gastroesophageal reflux disease) 02/17/2011  . Migraines 11/08/11   "@ least twice/month"  . 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  . Mood swings ([place]) 11/08/11   "I go back and forth; real bad"  . Sickle cell anemia ([name])   . Sickle cell anemia with crisis ([place])   . Trichotillomania    h/o    Patient Active Problem List   Diagnosis Date Noted  . Sickle cell anemia with crisis ([place]) 01/05/2016  . Nausea & vomiting 12/27/2015  . Sickle cell crisis ([place]) 11/09/2015  . Sepsis ([place]) 11/09/2015  . Thrombocytosis ([place]) 11/09/2015  . Vaginal bleeding 11/09/2015  . Hyperbilirubinemia 11/09/2015  . Pyrexia   . Vaginal delivery 10/31/2015  . Hb-SS disease with crisis ([place])   . Sickle cell pain crisis ([place]) 10/26/2015  . Positive GBS  test 10/18/2015  . Narcotic dependence ([place]) 10/06/2015  . 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 High risk for intrapartum complications, antepartum 08/12/2015  . Chronic pain 08/04/2015  . Cocaine use 07/24/2015  . Maternal sickle cell anemia affecting pregnancy, antepartum ([place]) 07/14/2015  . Anemia of chronic disease   . Sickle cell anemia with pain ([place]) 05/28/2013  . Hb-SS disease without crisis ([place]) 02/26/2013  . Migraines 11/08/2011  . GERD (gastroesophageal reflux disease) 02/17/2011  . Depression 01/06/2011  . Sickle cell disease ([place]) 01/08/2009  . 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 CHOLECYSTECTOMY  05/2010  . DILATION AND CURETTAGE OF UTERUS  02/20/11   S/P miscarriage  . IR GENERIC HISTORICAL  12/23/2015   IR FLUORO GUIDE CV LINE RIGHT 12/23/2015 Jacqulynn Cadet, MD [facility]-INTER[facility] RAD  . IR GENERIC HISTORICAL  12/23/2015   IR US GUIDE VASC ACCESS RIGHT 12/23/2015 Jacqulynn Cadet, MD [facility]    OB History    Gravida Para Term Preterm AB Living   3 2 2   1 2    SAB TAB Ectopic Multiple Live Births   1     0 2      Obstetric Comments   Miscarried in October 2012 at about 7 weeks       Home Medications  Prior to Admission medications   Medication Sig Start Date End Date Taking? Authorizing Provider  acetaminophen (TYLENOL) 500 MG tablet Take 1,000 mg by mouth daily as needed for moderate pain.    Historical Provider, MD  aspirin-acetaminophen-caffeine (EXCEDRIN MIGRAINE) [phone] MG tablet Take 2 tablets by mouth every 6 (six) hours as needed for headache.     Historical Provider, MD  cyclobenzaprine (FLEXERIL) 10 MG tablet Take 1 tablet (10 mg total) by mouth 3 (three) times daily as needed for muscle spasms. 01/09/16   Elwyn Reach, MD  hydroxyurea (HYDREA) 500 MG capsule Take 1 capsule (500 mg total) by mouth  daily. May take with food to minimize GI side effects. 12/15/15   Tresa Garter, MD  ibuprofen (ADVIL,MOTRIN) 600 MG tablet Take 600 mg by mouth every 6 (six) hours as needed for mild pain or moderate pain.    Historical Provider, MD  methadone (DOLOPHINE) 5 MG tablet Take 1 tablet (5 mg total) by mouth daily. 01/19/16   Tresa Garter, MD  oxyCODONE (ROXICODONE) 15 MG immediate release tablet Take 1 tablet (15 mg total) by mouth every 4 (four) hours as needed for pain. 01/22/16 02/06/16  Micheline Chapman, NP  valACYclovir (VALTREX) 500 MG tablet Take 1,000 mg by mouth daily.     Historical Provider, MD    Family History Family History  Problem Relation Age of Onset  . Sickle cell trait Mother   . Sickle cell trait Father   . Diabetes Maternal Grandmother   . Diabetes Paternal Grandmother   . Hypertension Paternal Grandmother   . Diabetes Maternal Grandfather     Social History Social History  Substance Use Topics  . Smoking status: Former Smoker    Packs/day: 0.25    Years: 1.00    Types: Cigarettes    Quit date: 03/25/2013  . Smokeless tobacco: Never Used  . Alcohol use No     Comment: pt states she quit marijuan in May 2013. Rare ETOH, + cigarettes.  She is enrolled in school     Allergies   Food and Latex   Review of Systems Review of Systems  Constitutional: Negative for chills, diaphoresis and fever.  Gastrointestinal: Positive for nausea and vomiting.  Musculoskeletal: Positive for back pain.  All other systems reviewed and are negative.    Physical Exam Updated Vital Signs BP 111/64 (BP Location: Left Arm)   Pulse 99   Temp 99.8 F (37.7 C) (Oral)   Resp 20   LMP 01/03/2016   SpO2 100%   Physical Exam  Constitutional: She appears well-developed and well-nourished. No distress.  HENT:  Head: Normocephalic and atraumatic.  Eyes: Conjunctivae are normal.  Neck: Neck supple.  Cardiovascular: Normal rate and regular rhythm.   No murmur  heard. Systolic ejection murmur over left external border.  Pulmonary/Chest: Effort normal and breath sounds normal. No respiratory distress.  Abdominal: Soft. There is no tenderness.  Musculoskeletal: She exhibits no edema.  Neurological: She is alert.  Skin: Skin is warm and dry.  Psychiatric: She has a normal mood and affect.  Nursing note and vitals reviewed.    ED Treatments / Results  DIAGNOSTIC STUDIES:  Oxygen Saturation is 100% on RA, normal by my interpretation.    COORDINATION OF CARE:  2:59 AM Discussed treatment plan with pt at bedside which included IV fluids and pain medication and pt agreed to plan.  Labs (all labs ordered are listed, but only abnormal results are displayed) Labs Reviewed  COMPREHENSIVE METABOLIC PANEL - Abnormal; Notable for the following:       Result Value   Potassium 3.4 (*)    Calcium 8.8 (*)    Total Bilirubin 2.6 (*)    All other components within normal limits  CBC WITH DIFFERENTIAL/PLATELET - Abnormal; Notable for the following:    WBC 11.2 (*)    RBC 2.98 (*)    Hemoglobin 8.7 (*)    HCT 25.2 (*)    RDW 19.8 (*)    Platelets 505 (*)    Monocytes Absolute 1.2 (*)    Eosinophils Absolute 1.0 (*)    All other components within normal limits  RETICULOCYTES - Abnormal; Notable for the following:    Retic Ct Pct 21.2 (*)    RBC. 2.98 (*)    Retic Count, Manual 631.8 (*)    All other components within normal limits    Procedures Procedures (including critical care time)  Medications Ordered in ED Medications  dextrose 5 %-0.45 % sodium chloride infusion (150 mL/hr Intravenous New Bag/Given 01/30/16 0354)  ondansetron (ZOFRAN) injection 4 mg (4 mg Intravenous Given 01/30/16 0401)  HYDROmorphone (DILAUDID) injection 2.7 mg (not administered)    Or  HYDROmorphone (DILAUDID) injection 1.8 mg (not administered)  diphenhydrAMINE (BENADRYL) injection 25 mg (not administered)  ketorolac (TORADOL) 30 MG/ML injection 30 mg (30 mg  Intravenous Given 01/30/16 0411)  HYDROmorphone (DILAUDID) injection 1.8 mg (1.8 mg Intravenous Given 01/30/16 0413)    Or  HYDROmorphone (DILAUDID) injection 1.8 mg ( Subcutaneous See Alternative 01/30/16 0413)  diphenhydrAMINE (BENADRYL) injection 25 mg (25 mg Intravenous Given 01/30/16 0409)  HYDROmorphone (DILAUDID) injection 2.3 mg (2.3 mg Intravenous Given 01/30/16 0544)    Or  HYDROmorphone (DILAUDID) injection 1.8 mg ( Subcutaneous See Alternative 01/30/16 0544)     Initial Impression / Assessment and Plan / ED Course  I have reviewed the triage vital signs and the nursing notes.  Pertinent labs & imaging results that were available during my care of the patient were reviewed by me and considered in my medical decision making (see chart for details).  Clinical Course   Back pain consistent with sickle cell vaso-occlusive crisis. No evidence of acute chest syndrome. No obvious underlying infection. She has been vomiting so she is given IV fluids and ondansetron. She is given a series of injections of hydromorphone as well as a single dose of ketorolac. She had good response with this and is discharged home with instructions to follow-up with her PCP. Old records are reviewed, and she has multiple hospital admissions and ED visits for sickle cell disease. Last ED visit was on September 11.  Final Clinical Impressions(s) / ED Diagnoses   Final diagnoses:  Vaso-occlusive sickle cell crisis [facility])    New Prescriptions New Prescriptions   No medications on file   I personally performed the services described in this documentation, which was scribed in my presence. The recorded information has been reviewed and is accurate.       Delora Fuel, MD [id number]  After patient was set up for discharge, she stated her pain got worse and did not feel that she was ready to go home. Arrangements were made for hospitalization. Cases been discussed with Dr. Blaine Hamper of triad hospitalists who agrees  to come to evaluate the patient.    Delora Fuel, MD [id number]

## 2016-01-31 DIAGNOSIS — Z8249 Family history of ischemic heart disease and other diseases of the circulatory system: Secondary | ICD-10-CM | POA: Diagnosis not present

## 2016-01-31 DIAGNOSIS — G894 Chronic pain syndrome: Secondary | ICD-10-CM | POA: Diagnosis not present

## 2016-01-31 DIAGNOSIS — K219 Gastro-esophageal reflux disease without esophagitis: Secondary | ICD-10-CM | POA: Diagnosis not present

## 2016-01-31 DIAGNOSIS — E860 Dehydration: Secondary | ICD-10-CM | POA: Diagnosis not present

## 2016-01-31 DIAGNOSIS — Z833 Family history of diabetes mellitus: Secondary | ICD-10-CM | POA: Diagnosis not present

## 2016-01-31 DIAGNOSIS — M549 Dorsalgia, unspecified: Secondary | ICD-10-CM | POA: Diagnosis not present

## 2016-01-31 DIAGNOSIS — I4581 Long QT syndrome: Secondary | ICD-10-CM | POA: Diagnosis not present

## 2016-01-31 DIAGNOSIS — R112 Nausea with vomiting, unspecified: Secondary | ICD-10-CM | POA: Diagnosis not present

## 2016-01-31 DIAGNOSIS — R000 Tachycardia, unspecified: Secondary | ICD-10-CM | POA: Diagnosis not present

## 2016-01-31 DIAGNOSIS — D72829 Elevated white blood cell count, unspecified: Secondary | ICD-10-CM | POA: Diagnosis not present

## 2016-01-31 DIAGNOSIS — Z79899 Other long term (current) drug therapy: Secondary | ICD-10-CM | POA: Diagnosis not present

## 2016-01-31 DIAGNOSIS — Z832 Family history of diseases of the blood and blood-forming organs and certain disorders involving the immune mechanism: Secondary | ICD-10-CM | POA: Diagnosis not present

## 2016-01-31 DIAGNOSIS — Z79891 Long term (current) use of opiate analgesic: Secondary | ICD-10-CM | POA: Diagnosis not present

## 2016-01-31 DIAGNOSIS — E876 Hypokalemia: Secondary | ICD-10-CM

## 2016-01-31 DIAGNOSIS — D638 Anemia in other chronic diseases classified elsewhere: Secondary | ICD-10-CM | POA: Diagnosis not present

## 2016-01-31 DIAGNOSIS — K5900 Constipation, unspecified: Secondary | ICD-10-CM | POA: Diagnosis not present

## 2016-01-31 DIAGNOSIS — Z91040 Latex allergy status: Secondary | ICD-10-CM | POA: Diagnosis not present

## 2016-01-31 DIAGNOSIS — Z87891 Personal history of nicotine dependence: Secondary | ICD-10-CM | POA: Diagnosis not present

## 2016-01-31 DIAGNOSIS — G8929 Other chronic pain: Secondary | ICD-10-CM | POA: Diagnosis present

## 2016-01-31 DIAGNOSIS — D5700 Hb-SS disease with crisis, unspecified: Secondary | ICD-10-CM | POA: Diagnosis not present

## 2016-01-31 LAB — COMPREHENSIVE METABOLIC PANEL
ALBUMIN: 3.8 g/dL (ref 3.5–5.0)
ALT: 17 U/L (ref 14–54)
AST: 19 U/L (ref 15–41)
Alkaline Phosphatase: 50 U/L (ref 38–126)
Anion gap: 4 — ABNORMAL LOW (ref 5–15)
BILIRUBIN TOTAL: 2.1 mg/dL — AB (ref 0.3–1.2)
BUN: 7 mg/dL (ref 6–20)
CHLORIDE: 109 mmol/L (ref 101–111)
CO2: 25 mmol/L (ref 22–32)
CREATININE: 0.68 mg/dL (ref 0.44–1.00)
Calcium: 8.5 mg/dL — ABNORMAL LOW (ref 8.9–10.3)
GFR calc Af Amer: >60 mL/min (ref >60)
GFR calc non Af Amer: >60 mL/min (ref >60)
GLUCOSE: 95 mg/dL (ref 65–99)
POTASSIUM: 4 mmol/L (ref 3.5–5.1)
Sodium: 138 mmol/L (ref 135–145)
TOTAL PROTEIN: 6.3 g/dL — AB (ref 6.5–8.1)

## 2016-01-31 LAB — CBC WITH DIFFERENTIAL/PLATELET
BASOS ABS: 0.2 10*3/uL — AB (ref 0.0–0.1)
Basophils Relative: 1 %
EOS PCT: 9 %
Eosinophils Absolute: 1.4 10*3/uL — ABNORMAL HIGH (ref 0.0–0.7)
HEMATOCRIT: 20.9 % — AB (ref 36.0–46.0)
Hemoglobin: 7 g/dL — ABNORMAL LOW (ref 12.0–15.0)
LYMPHS ABS: 3.5 10*3/uL (ref 0.7–4.0)
LYMPHS PCT: 23 %
MCH: 29.5 pg (ref 26.0–34.0)
MCHC: 33.5 g/dL (ref 30.0–36.0)
MCV: 88.2 fL (ref 78.0–100.0)
MONOS PCT: 10 %
Monocytes Absolute: 1.5 10*3/uL — ABNORMAL HIGH (ref 0.1–1.0)
NEUTROS PCT: 57 %
Neutro Abs: 8.6 10*3/uL — ABNORMAL HIGH (ref 1.7–7.7)
Platelets: 491 10*3/uL — ABNORMAL HIGH (ref 150–400)
RBC: 2.37 MIL/uL — AB (ref 3.87–5.11)
RDW: 21.4 % — AB (ref 11.5–15.5)
WBC: 15.2 10*3/uL — AB (ref 4.0–10.5)

## 2016-01-31 NOTE — Progress Notes (Signed)
Patient ID: LOURENA LICHT, female   DOB: August 11, 1990, 25 y.o.   MRN: [id number] Subjective:  [name] is a 25 y.o. female with medical history significant of sickle cell disease, chronic back pain, GERD, depression, who presents with worsening back pain, nausea, vomiting. She was admitted for dehydration and consequent sickle cell crisis. She is doing much better today, pain is down to 7/10. No fever. She had a prolonged QT interval on EKG, methadone on hold. Hypokalemia replaced. No chest pain. No cough, no SOB.  Objective:  Vital signs in last 24 hours:  Vitals:   01/31/16 0956 01/31/16 1310 01/31/16 1346 01/31/16 1525  BP:   117/61   Pulse:   92   Resp: 16 15 15 16   Temp:   98.8 F (37.1 C)   TempSrc:   Oral   SpO2: 97% 98% 98% 98%  Weight:      Height:        Intake/Output from previous day:   Intake/Output Summary (Last 24 hours) at 01/31/16 1629 Last data filed at 01/31/16 0805  Gross per 24 hour  Intake            272.4 ml  Output                0 ml  Net            272.4 ml    Physical Exam: General: Alert, awake, oriented x3, in no acute distress.  HEENT: [place]/AT PEERL, EOMI Neck: Trachea midline,  no masses, no thyromegal,y no JVD, no carotid bruit OROPHARYNX:  Moist, No exudate/ erythema/lesions.  Heart: Regular rate and rhythm, without murmurs, rubs, gallops, PMI non-displaced, no heaves or thrills on palpation.  Lungs: Clear to auscultation, no wheezing or rhonchi noted. No increased vocal fremitus resonant to percussion  Abdomen: Soft, nontender, nondistended, positive bowel sounds, no masses no hepatosplenomegaly noted..  Neuro: No focal neurological deficits noted cranial nerves II through XII grossly intact. DTRs 2+ bilaterally upper and lower extremities. Strength 5 out of 5 in bilateral upper and lower extremities. Musculoskeletal: No warm swelling or erythema around joints, no spinal tenderness noted. Psychiatric: Patient alert and oriented x3,  good insight and cognition, good recent to remote recall. Lymph node survey: No cervical axillary or inguinal lymphadenopathy noted.  Lab Results:  Basic Metabolic Panel:    Component Value Date/Time   NA 138 01/31/2016 0500   NA 139 05/06/2012 1645   K 4.0 01/31/2016 0500   K 3.9 05/06/2012 1645   CL 109 01/31/2016 0500   CL 110 (H) 05/06/2012 1645   CO2 25 01/31/2016 0500   CO2 22 05/06/2012 1645   BUN 7 01/31/2016 0500   BUN 9 05/06/2012 1645   CREATININE 0.68 01/31/2016 0500   CREATININE 0.43 (L) 07/06/2015 1356   GLUCOSE 95 01/31/2016 0500   GLUCOSE 84 05/06/2012 1645   CALCIUM 8.5 (L) 01/31/2016 0500   CALCIUM 8.3 (L) 05/06/2012 1645   CBC:    Component Value Date/Time   WBC 15.2 (H) 01/31/2016 0500   HGB 7.0 (L) 01/31/2016 0500   HGB 8.7 (L) 05/11/2012 0721   HCT 20.9 (L) 01/31/2016 0500   HCT 26.9 (L) 05/11/2012 0721   PLT 491 (H) 01/31/2016 0500   PLT 497 (H) 05/11/2012 0721   MCV 88.2 01/31/2016 0500   MCV 94 05/11/2012 0721   NEUTROABS 8.6 (H) 01/31/2016 0500   NEUTROABS 10.9 (H) 05/10/2012 0552   LYMPHSABS 3.5 01/31/2016 0500  LYMPHSABS 3.5 05/10/2012 0552   MONOABS 1.5 (H) 01/31/2016 0500   MONOABS 1.7 (H) 05/10/2012 0552   EOSABS 1.4 (H) 01/31/2016 0500   EOSABS 0.8 (H) 05/10/2012 0552   BASOSABS 0.2 (H) 01/31/2016 0500   BASOSABS 0.1 05/10/2012 0552    No results found for this or any previous visit (from the past 240 hour(s)).  Studies/Results: No results found.  Medications: Scheduled Meds: . enoxaparin (LOVENOX) injection  40 mg Subcutaneous Q24H  . HYDROmorphone   Intravenous Q4H  . hydroxyurea  500 mg Oral Daily  . Influenza vac split quadrivalent PF  0.5 mL Intramuscular Tomorrow-1000  . senna-docusate  1 tablet Oral BID  . sodium chloride  1,000 mL Intravenous Once  . valACYclovir  1,000 mg Oral Daily   Continuous Infusions:  PRN Meds:.acetaminophen, aspirin-acetaminophen-caffeine, cyclobenzaprine, diphenhydrAMINE **OR**  diphenhydrAMINE, HYDROmorphone (DILAUDID) injection, ibuprofen, naloxone **AND** sodium chloride flush, polyethylene glycol  Consultants:  None  Procedures:  None  Antibiotics:  None  Assessment/Plan: Principal Problem:   Sickle cell pain crisis ([place]) Active Problems:   Sickle cell disease ([name])   Hypokalemia   Nausea & vomiting   1. Hb SS with crisis: Patient slowly improving. Will continue Dilaudid PCA and clinician assisted doses at the current dose. Patient encouraged to eat and drink well, ambulate as tolerated 2. Leukocytosis: No evidence of infection, no fever. Will continue to monitor. No indication for antibiotics at this time 3. Anemia of chronic disease: Hb dropped to 7.0 from 876. Will continue to monitor 4. Prolonged QT Interval: Hypokalemia Vs Methadone... Potassium is back to normal, will repeat EKG tomorrow before restarting methadone, if QT interval is still prolonged, then may be due to Methadone and will remain on hold.  5. Chronic pain: Methadone on hold  Code Status: Full Code Family Communication: N/A Disposition Plan: Not yet ready for discharge  Theresa Wedel  If 7PM-7AM, please contact night-coverage.  01/31/2016, 4:29 PM  LOS: 0 days

## 2016-01-31 NOTE — Care Management Obs Status (Signed)
[facility] NOTIFICATION   Patient Details  Name: [name] MRN: [id number] Date of Birth: 1991-05-05   Medicare Observation Status Notification Given:  Yes    Erenest Rasher, RN 01/31/2016, 3:22 PM

## 2016-02-01 DIAGNOSIS — D638 Anemia in other chronic diseases classified elsewhere: Secondary | ICD-10-CM

## 2016-02-01 DIAGNOSIS — D72829 Elevated white blood cell count, unspecified: Secondary | ICD-10-CM

## 2016-02-01 DIAGNOSIS — D5700 Hb-SS disease with crisis, unspecified: Principal | ICD-10-CM

## 2016-02-01 MED ORDER — DIPHENHYDRAMINE HCL 50 MG PO CAPS: 50.0000 mg | ORAL_CAPSULE | Freq: Four times a day (QID) | ORAL | Status: DC | PRN

## 2016-02-01 MED ORDER — HYDROMORPHONE HCL 2 MG/ML IJ SOLN
2.0000 mg | INTRAMUSCULAR | Status: DC | PRN
  Administered 2016-02-01 – 2016-02-02 (×7): 2 mg via INTRAVENOUS
  Filled 2016-02-01 (×7): qty 1

## 2016-02-01 MED ORDER — DIPHENHYDRAMINE HCL 25 MG PO CAPS: 25.0000 mg | ORAL_CAPSULE | ORAL | Status: DC | PRN

## 2016-02-01 MED ORDER — SODIUM CHLORIDE 0.9% FLUSH: 9.0000 mL | INTRAVENOUS | Status: DC | PRN

## 2016-02-01 MED ORDER — HYDROMORPHONE HCL 2 MG/ML IJ SOLN: 2.0000 mg | INTRAMUSCULAR | Status: DC | PRN

## 2016-02-01 MED ORDER — SODIUM CHLORIDE 0.9 % IV SOLN: 25.0000 mg | INTRAVENOUS | Status: DC | PRN

## 2016-02-01 MED ORDER — HYDROXYUREA 500 MG PO CAPS
500.0000 mg | ORAL_CAPSULE | Freq: Once | ORAL | Status: AC
  Administered 2016-02-01: 500 mg via ORAL
  Filled 2016-02-01: qty 1

## 2016-02-01 MED ORDER — HYDROXYUREA 500 MG PO CAPS
1000.0000 mg | ORAL_CAPSULE | Freq: Every day | ORAL | Status: DC
  Administered 2016-02-02 – 2016-02-05 (×4): 1000 mg via ORAL
  Filled 2016-02-01 (×4): qty 2

## 2016-02-01 MED ORDER — KETOROLAC TROMETHAMINE 30 MG/ML IJ SOLN
30.0000 mg | Freq: Four times a day (QID) | INTRAMUSCULAR | Status: DC
Start: 2016-02-01 — End: 2016-02-05
  Administered 2016-02-01 – 2016-02-05 (×17): 30 mg via INTRAVENOUS
  Filled 2016-02-01 (×17): qty 1

## 2016-02-01 MED ORDER — ONDANSETRON HCL 4 MG/2ML IJ SOLN: 4.0000 mg | Freq: Four times a day (QID) | INTRAMUSCULAR | Status: DC | PRN

## 2016-02-01 MED ORDER — NALOXONE HCL 0.4 MG/ML IJ SOLN: 0.4000 mg | INTRAMUSCULAR | Status: DC | PRN

## 2016-02-01 MED ORDER — HYDROMORPHONE 1 MG/ML IV SOLN: INTRAVENOUS | Status: DC

## 2016-02-01 MED ORDER — SODIUM CHLORIDE 0.9 % IV SOLN
25.0000 mg | INTRAVENOUS | Status: DC | PRN
  Filled 2016-02-01: qty 0.5

## 2016-02-01 MED ORDER — HYDROMORPHONE 1 MG/ML IV SOLN
INTRAVENOUS | Status: DC
  Administered 2016-02-02: 09:00:00 via INTRAVENOUS
  Administered 2016-02-02: 9.8 mg via INTRAVENOUS
  Administered 2016-02-02: 7 mg via INTRAVENOUS
  Administered 2016-02-02: 2.8 mg via INTRAVENOUS
  Administered 2016-02-02: 14.2 mg via INTRAVENOUS
  Administered 2016-02-02: 4.5 mg via INTRAVENOUS
  Administered 2016-02-02: 2.8 mg via INTRAVENOUS
  Administered 2016-02-02: 25 mg via INTRAVENOUS
  Administered 2016-02-03: 7.7 mg via INTRAVENOUS
  Administered 2016-02-03: 6.3 mg via INTRAVENOUS
  Administered 2016-02-03: 17:00:00 via INTRAVENOUS
  Administered 2016-02-03: 4.9 mg via INTRAVENOUS
  Administered 2016-02-03: 1.4 mg via INTRAVENOUS
  Administered 2016-02-03: 7 mg via INTRAVENOUS
  Administered 2016-02-03: 12.6 mg via INTRAVENOUS
  Administered 2016-02-04: 4.9 mg via INTRAVENOUS
  Administered 2016-02-04: 08:00:00 via INTRAVENOUS
  Administered 2016-02-04: 8.4 mg via INTRAVENOUS
  Administered 2016-02-04: 7.7 mg via INTRAVENOUS
  Administered 2016-02-04 (×2): 6.3 mg via INTRAVENOUS
  Administered 2016-02-04: 4.2 mg via INTRAVENOUS
  Administered 2016-02-05: 6.3 mg via INTRAVENOUS
  Administered 2016-02-05: 11.9 mg via INTRAVENOUS
  Administered 2016-02-05: 12:00:00 via INTRAVENOUS
  Administered 2016-02-05: 4.9 mg via INTRAVENOUS
  Filled 2016-02-01 (×6): qty 25

## 2016-02-01 MED ORDER — DIPHENHYDRAMINE HCL 25 MG PO CAPS
25.0000 mg | ORAL_CAPSULE | Freq: Four times a day (QID) | ORAL | Status: DC | PRN
  Administered 2016-02-01 – 2016-02-05 (×9): 50 mg via ORAL
  Filled 2016-02-01 (×9): qty 2

## 2016-02-01 NOTE — Progress Notes (Signed)
SICKLE CELL SERVICE PROGRESS NOTE  [name] [id number] DOB: [birth year] DOA: 01/30/2016 PCP: Angelica Chessman, MD  Assessment/Plan: Principal Problem:   Sickle cell pain crisis ([place]) Active Problems:   Sickle cell disease ([place])   Hypokalemia   Nausea & vomiting  1. Hb SS with crisis: Pt reports pain as 8/10 and localized to low low back and left flank. She has used 25.9 mg with 38/37:demands/deliveries in the last 24 hours. I will continue PCA at current dose and change Ibuprofen to Toradol 30 mg. Methadone has been discontinued due prolonged QTc on EKG.  2. Leukocytosis: No evidence of infection. Suspect that this is related to crisis. A chronic leukocytosis is a poor prognostic factor in SCD. Thus I will increase her Hydrea to 1000 mg (13.5 mg/kg) daily as she is not at the MTD and actually subtherapeutic (6.7 mg/kg) for her weight. Pt agreeable to increase in dose. She should have her labs checked at the  2 & 4 week interval.  If tolerating increased dose at 4 weeks should be increased to an alternating 1000 mg and 1500 mg QOD. At the time the dose would be therapeutic (approx16.9 mg/kg) and should be titrated no more frequently than every 12 weeks to MTD.  3. Anemia of Chronic Disease: Hb at baseline and stable.  4. Chronic pain: Pt was on Methadone. It has been discontinued and I would recommend giving a trial of no long acting and increasing her Hydrea while maximizing her short acting opiates. Pt has done well in the past with just short acting opiates when her Hydrea was therapeutic.   5. Psychosocial: Pt relates that she had a severe bout of post-partum depression and coped mostly by sleeping and eating. She is feeling better but feels that she still needs some psychosocial support. She will call the SW from the [facility] and schedule her therapy sessions.   Code Status: Full Code Family Communication: N/A Disposition Plan: Not yet ready for  discharge  [place].  Pager [phone]. If 7PM-7AM, please contact night-coverage.  02/01/2016, 12:05 PM  LOS: 1 day   Interim History: Pt reports pain as 8/10 and localized to low low back and left flank. Pt has not had a BM since admission.   Consultants:  None  Procedures:  None  Antibiotics:  None   Objective: Vitals:   02/01/16 0607 02/01/16 0610 02/01/16 0815 02/01/16 1009  BP: 109/67   (!) 106/56  Pulse: 93   78  Resp: 18 12 14 10   Temp: 99.2 F (37.3 C)   98.5 F (36.9 C)  TempSrc: Oral   Oral  SpO2: 99%  95% 95%  Weight:      Height:       Weight change:   Intake/Output Summary (Last 24 hours) at 02/01/16 1205 Last data filed at 02/01/16 1010  Gross per 24 hour  Intake           2049.3 ml  Output             3700 ml  Net          -1650.7 ml    General: Alert, awake, oriented x3, in no acute distress.  HEENT: [place]/AT PEERL, EOMI, anicteric Neck: Trachea midline,  no masses, no thyromegal,y no JVD, no carotid bruit OROPHARYNX:  Moist, No exudate/ erythema/lesions.  Heart: Regular rate and rhythm, without murmurs, rubs, gallops, PMI non-displaced, no heaves or thrills on palpation.  Lungs: Clear to auscultation, no  wheezing or rhonchi noted. No increased vocal fremitus resonant to percussion  Abdomen: Soft, nontender, nondistended, positive bowel sounds, no masses no hepatosplenomegaly noted..  Neuro: No focal neurological deficits noted cranial nerves II through XII grossly intact. Strength at functional baseline in bilateral upper and lower extremities. Musculoskeletal: No warmth swelling or erythema around joints, no spinal tenderness noted. Psychiatric: Patient alert and oriented x3, good insight and cognition, good recent to remote recall.    Data Reviewed: Basic Metabolic Panel:  Recent Labs Lab 01/25/16 1745 01/30/16 0416 01/31/16 0500  NA 137 138 138  K 4.0 3.4* 4.0  CL 106 108 109  CO2 24 24 25   GLUCOSE 90 95 95  BUN 7 7 7    CREATININE 0.61 0.72 0.68  CALCIUM 9.2 8.8* 8.5*   Liver Function Tests:  Recent Labs Lab 01/25/16 1745 01/30/16 0416 01/31/16 0500  AST 24 22 19   ALT 16 20 17   ALKPHOS 54 54 50  BILITOT 2.0* 2.6* 2.1*  PROT 8.0 7.3 6.3*  ALBUMIN 5.1* 4.4 3.8    Recent Labs Lab 01/30/16 1315  LIPASE 31   No results for input(s): AMMONIA in the last 168 hours. CBC:  Recent Labs Lab 01/25/16 1745 01/30/16 0416 01/31/16 0500  WBC 13.7* 11.2* 15.2*  NEUTROABS 8.4* 6.2 8.6*  HGB 8.5* 8.7* 7.0*  HCT 24.7* 25.2* 20.9*  MCV 85.5 84.6 88.2  PLT 632* 505* 491*   Cardiac Enzymes: No results for input(s): CKTOTAL, CKMB, CKMBINDEX, TROPONINI in the last 168 hours. BNP (last 3 results) No results for input(s): BNP in the last 8760 hours.  ProBNP (last 3 results) No results for input(s): PROBNP in the last 8760 hours.  CBG: No results for input(s): GLUCAP in the last 168 hours.  No results found for this or any previous visit (from the past 240 hour(s)).   Studies: Dg Chest 2 View  Result Date: 01/03/2016 CLINICAL DATA:  Cough, back pain, history of sickle cell pain EXAM: CHEST  2 VIEW COMPARISON:  12/08/2015 FINDINGS: Cardiomediastinal silhouette is stable. No acute infiltrate or pleural effusion. No pulmonary edema. There is right IJ Port-A-Cath with tip in distal SVC. No pneumothorax. Bony thorax is unremarkable. IMPRESSION: No active cardiopulmonary disease. Electronically Signed   By: Lahoma Crocker M.D.   On: 01/03/2016 13:08    Scheduled Meds: . enoxaparin (LOVENOX) injection  40 mg Subcutaneous Q24H  . HYDROmorphone   Intravenous Q4H  . hydroxyurea  500 mg Oral Once   Followed by  . [START ON 02/02/2016] hydroxyurea  1,000 mg Oral Daily  . Influenza vac split quadrivalent PF  0.5 mL Intramuscular Tomorrow-1000  . senna-docusate  1 tablet Oral BID  . sodium chloride  1,000 mL Intravenous Once  . valACYclovir  1,000 mg Oral Daily   Continuous Infusions:   Principal Problem:    Sickle cell pain crisis ([name]) Active Problems:   Sickle cell disease ([name])   Hypokalemia   Nausea & vomiting     In excess of 25 minutes spent during this visit. Greater than 50% involved face to face contact with the patient for assessment, counseling and coordination of care.

## 2016-02-02 LAB — CBC WITH DIFFERENTIAL/PLATELET
BASOS ABS: 0.1 10*3/uL (ref 0.0–0.1)
BASOS PCT: 0 %
EOS ABS: 1.6 10*3/uL — AB (ref 0.0–0.7)
Eosinophils Relative: 11 %
HCT: 20.4 % — ABNORMAL LOW (ref 36.0–46.0)
HEMOGLOBIN: 7 g/dL — AB (ref 12.0–15.0)
Lymphocytes Relative: 15 %
Lymphs Abs: 2.3 10*3/uL (ref 0.7–4.0)
MCH: 30.4 pg (ref 26.0–34.0)
MCHC: 34.3 g/dL (ref 30.0–36.0)
MCV: 88.7 fL (ref 78.0–100.0)
MONOS PCT: 10 %
Monocytes Absolute: 1.6 10*3/uL — ABNORMAL HIGH (ref 0.1–1.0)
NEUTROS ABS: 10.1 10*3/uL — AB (ref 1.7–7.7)
NEUTROS PCT: 64 %
PLATELETS: 476 10*3/uL — AB (ref 150–400)
RBC: 2.3 MIL/uL — ABNORMAL LOW (ref 3.87–5.11)
RDW: 22 % — ABNORMAL HIGH (ref 11.5–15.5)
WBC: 15.1 10*3/uL — AB (ref 4.0–10.5)

## 2016-02-02 LAB — RETICULOCYTES
RBC.: 2.3 MIL/uL — AB (ref 3.87–5.11)
RETIC CT PCT: 19.2 % — AB (ref 0.4–3.1)
Retic Count, Absolute: 441.6 10*3/uL — ABNORMAL HIGH (ref 19.0–186.0)

## 2016-02-02 MED ORDER — SODIUM CHLORIDE 0.9% FLUSH: 10.0000 mL | INTRAVENOUS | Status: DC | PRN

## 2016-02-02 MED ORDER — HYDROMORPHONE HCL 2 MG/ML IJ SOLN
2.0000 mg | INTRAMUSCULAR | Status: AC
  Administered 2016-02-02 (×2): 2 mg via INTRAVENOUS
  Filled 2016-02-02 (×3): qty 1

## 2016-02-02 MED ORDER — HYDROMORPHONE HCL 2 MG/ML IJ SOLN
2.0000 mg | INTRAMUSCULAR | Status: DC | PRN
  Administered 2016-02-02 – 2016-02-04 (×16): 2 mg via INTRAVENOUS
  Filled 2016-02-02 (×16): qty 1

## 2016-02-02 NOTE — Care Management Note (Signed)
Case Management Note  Patient Details  Name: [name] MRN: [id number] Date of Birth: Apr 28, 1991  Subjective/Objective:    25 yo admitted with [facility]                Action/Plan: From home with family. Chart reviewed and no CM needs identified or communicated at this time. CM will continue to follow.  Expected Discharge Date:                  Expected Discharge Plan:  Home/Self Care  In-House Referral:     Discharge planning Services  CM Consult  Post Acute Care Choice:    Choice offered to:     DME Arranged:    DME Agency:     HH Arranged:    HH Agency:     Status of Service:  In process, will continue to follow  If discussed at Long Length of Stay Meetings, dates discussed:    Additional CommentsLynnell Catalan, RN 02/02/2016, 1:19 PM  [phone]

## 2016-02-02 NOTE — Progress Notes (Signed)
SICKLE CELL SERVICE PROGRESS NOTE  [name] [id number] DOB: 1990-10-03 DOA: 01/30/2016 PCP: Angelica Chessman, MD  Assessment/Plan: Principal Problem:   Sickle cell pain crisis ([place]) Active Problems:   Sickle cell disease ([place])   Hypokalemia   Nausea & vomiting  1. Hb SS with crisis: Pt reports pain as 8/10 and localized to low low back and left flank. She has used 35.59 mg with 54/48:demands/deliveries in the last 24 hours. I will continue PCA and Toradol at current dose and schedule clinician assisted doses. Methadone has been discontinued due prolonged QTc on EKG. Will consider starting alternate long acting opiate if not achieving control of pain in a few days. 2. Leukocytosis: No evidence of infection. Suspect that this is related to crisis. A chronic leukocytosis is a poor prognostic factor in SCD. Thus I will increase her Hydrea to 1000 mg (13.5 mg/kg) daily as she is not at the MTD and actually subtherapeutic (6.7 mg/kg) for her weight. Pt agreeable to increase in dose. She should have her labs checked at the  2 & 4 week interval.  If tolerating increased dose at 4 weeks should be increased to an alternating 1000 mg and 1500 mg QOD. At the time the dose would be therapeutic (approx16.9 mg/kg) and should be titrated no more frequently than every 12 weeks to MTD.  3. Anemia of Chronic Disease: Hb at baseline and stable.  4. Chronic pain: Pt was on Methadone. It has been discontinued due to prolonged QTc. Pt has done well in the past with just short acting opiates when her Hydrea was therapeutic.  Labs from today pending to check indices. 5. Psychosocial: Pt relates that she had a severe bout of post-partum depression and coped mostly by sleeping and eating. She is feeling better but feels that she still needs some psychosocial support. [facility] here to see patient.   Code Status: Full Code Family Communication: N/A Disposition Plan: Not  yet ready for discharge  [place].  Pager [phone]. If 7PM-7AM, please contact night-coverage.  02/02/2016, 10:37 AM  LOS: 2 days   Interim History: Pt reports pain as 8/10 and localized to low low back and left flank. Pt has not had a BM since admission. She has used 35.59 mg with 54/48:demands/deliveries in the last 24 hours.  Consultants:  None  Procedures:  None  Antibiotics:  None   Objective: Vitals:   02/02/16 0636 02/02/16 0807 02/02/16 0855 02/02/16 0929  BP: 122/68   129/76  Pulse: 88   98  Resp: 16 16 12 19   Temp: 98.9 F (37.2 C)   98.8 F (37.1 C)  TempSrc: Oral   Oral  SpO2: 100% 100% 95% 99%  Weight:      Height:       Weight change:   Intake/Output Summary (Last 24 hours) at 02/02/16 1037 Last data filed at 02/02/16 0933  Gross per 24 hour  Intake              340 ml  Output             3300 ml  Net            -2960 ml    General: Alert, awake, oriented x3, in no acute distress.  HEENT: [place]/AT PEERL, EOMI, anicteric Neck: Trachea midline,  no masses, no thyromegal,y no JVD, no carotid bruit OROPHARYNX:  Moist, No exudate/ erythema/lesions.  Heart: Regular rate and rhythm, without murmurs, rubs,  gallops, PMI non-displaced, no heaves or thrills on palpation.  Lungs: Clear to auscultation, no wheezing or rhonchi noted. No increased vocal fremitus resonant to percussion  Abdomen: Soft, nontender, nondistended, positive bowel sounds, no masses no hepatosplenomegaly noted..  Neuro: No focal neurological deficits noted cranial nerves II through XII grossly intact. Strength at functional baseline in bilateral upper and lower extremities. Musculoskeletal: No warmth swelling or erythema around joints, no spinal tenderness noted. Psychiatric: Patient alert and oriented x3, good insight and cognition, good recent to remote recall.    Data Reviewed: Basic Metabolic Panel:  Recent Labs Lab 01/30/16 0416 01/31/16 0500  NA 138 138  K 3.4*  4.0  CL 108 109  CO2 24 25  GLUCOSE 95 95  BUN 7 7  CREATININE 0.72 0.68  CALCIUM 8.8* 8.5*   Liver Function Tests:  Recent Labs Lab 01/30/16 0416 01/31/16 0500  AST 22 19  ALT 20 17  ALKPHOS 54 50  BILITOT 2.6* 2.1*  PROT 7.3 6.3*  ALBUMIN 4.4 3.8    Recent Labs Lab 01/30/16 1315  LIPASE 31   No results for input(s): AMMONIA in the last 168 hours. CBC:  Recent Labs Lab 01/30/16 0416 01/31/16 0500  WBC 11.2* 15.2*  NEUTROABS 6.2 8.6*  HGB 8.7* 7.0*  HCT 25.2* 20.9*  MCV 84.6 88.2  PLT 505* 491*   Cardiac Enzymes: No results for input(s): CKTOTAL, CKMB, CKMBINDEX, TROPONINI in the last 168 hours. BNP (last 3 results) No results for input(s): BNP in the last 8760 hours.  ProBNP (last 3 results) No results for input(s): PROBNP in the last 8760 hours.  CBG: No results for input(s): GLUCAP in the last 168 hours.  No results found for this or any previous visit (from the past 240 hour(s)).   Studies: Dg Chest 2 View  Result Date: 01/03/2016 CLINICAL DATA:  Cough, back pain, history of sickle cell pain EXAM: CHEST  2 VIEW COMPARISON:  12/08/2015 FINDINGS: Cardiomediastinal silhouette is stable. No acute infiltrate or pleural effusion. No pulmonary edema. There is right IJ Port-A-Cath with tip in distal SVC. No pneumothorax. Bony thorax is unremarkable. IMPRESSION: No active cardiopulmonary disease. Electronically Signed   By: Lahoma Crocker M.D.   On: 01/03/2016 13:08    Scheduled Meds: . enoxaparin (LOVENOX) injection  40 mg Subcutaneous Q24H  . HYDROmorphone   Intravenous Q4H  .  HYDROmorphone (DILAUDID) injection  2 mg Intravenous Q2H  . hydroxyurea  1,000 mg Oral Daily  . Influenza vac split quadrivalent PF  0.5 mL Intramuscular Tomorrow-1000  . ketorolac  30 mg Intravenous Q6H  . senna-docusate  1 tablet Oral BID  . sodium chloride  1,000 mL Intravenous Once  . valACYclovir  1,000 mg Oral Daily   Continuous Infusions:   Principal Problem:   Sickle  cell pain crisis ([name]) Active Problems:   Sickle cell disease ([name])   Hypokalemia   Nausea & vomiting     In excess of 25 minutes spent during this visit. Greater than 50% involved face to face contact with the patient for assessment, counseling and coordination of care.

## 2016-02-03 DIAGNOSIS — K5900 Constipation, unspecified: Secondary | ICD-10-CM

## 2016-02-03 DIAGNOSIS — I4581 Long QT syndrome: Secondary | ICD-10-CM

## 2016-02-03 DIAGNOSIS — G894 Chronic pain syndrome: Secondary | ICD-10-CM

## 2016-02-03 NOTE — Progress Notes (Signed)
SICKLE CELL SERVICE PROGRESS NOTE  [name] [id number] DOB: 10/26/1990 DOA: 01/30/2016 PCP: Angelica Chessman, MD  Assessment/Plan: Principal Problem:   Sickle cell pain crisis ([name]) Active Problems:   Sickle cell disease ([name])   Hypokalemia   Nausea & vomiting  1. Hb SS with crisis: Pt reports pain as 8/10 and localized to low low back and left flank. She has used 35.59 mg with 54/48:demands/deliveries in the last 24 hours. I will continue PCA and Toradol at current dose and schedule clinician assisted doses. Methadone has been discontinued due prolonged QTc on EKG. Will consider starting alternate long acting opiate if not achieving control of pain in a few days. Plan to wean to oral analgesics tomorrow. 2. Leukocytosis: No evidence of infection. Suspect that this is related to crisis. A chronic leukocytosis is a poor prognostic factor in SCD.  3. Anemia of Chronic Disease: Hb at baseline and stable. Hydrea increased to 1000 mg (13.5 mg/kg) daily as she is not at the MTD and actually subtherapeutic (6.7 mg/kg) for her weight. Pt agreeable to increase in dose. So far indices stable. She should have her labs checked at the  2 & 4 week interval.  If tolerating increased dose at 4 weeks should be increased to an alternating 1000 mg and 1500 mg QOD. At the time the dose would be therapeutic (approx16.9 mg/kg) and should be titrated no more frequently than every 12 weeks to MTD.  4. Chronic pain: Pt was on Methadone. It has been discontinued due to prolonged QTc. Pt has done well in the past with just short acting opiates when her Hydrea was therapeutic.   5. Psychosocial: Pt relates that she had a severe bout of post-partum depression and coped mostly by sleeping and eating. She is feeling better but feels that she still needs some psychosocial support. [facility] here to see patient.   Code Status: Full Code Family Communication: N/A Disposition  Plan: Anticipate discharge in 69 hours  [name] A.  Pager [phone]. If 7PM-7AM, please contact night-coverage.  02/03/2016, 12:59 PM  LOS: 3 days   Interim History: Pt reports pain as 6/10 and localized to  low back and left arm. Pt has not had a BM since admission. She has used 37.1 mg with 54/53:demands/deliveries in addition to 16 mg in the last 24 hours  Pt had a small BM last night.  Consultants:  None  Procedures:  None  Antibiotics:  None   Objective: Vitals:   02/03/16 0527 02/03/16 0855 02/03/16 1023 02/03/16 1210  BP: (!) 101/54  113/75   Pulse: 93  93   Resp: 17 19 12 11   Temp: 97.7 F (36.5 C)  98.2 F (36.8 C)   TempSrc: Oral  Oral   SpO2: 100% 96% 98% 99%  Weight:      Height:       Weight change:   Intake/Output Summary (Last 24 hours) at 02/03/16 1259 Last data filed at 02/03/16 0527  Gross per 24 hour  Intake              240 ml  Output             2400 ml  Net            -2160 ml    General: Alert, awake, oriented x3, in no acute distress.  HEENT: [place]/AT PEERL, EOMI, anicteric Neck: Trachea midline,  no masses, no thyromegal,y no JVD, no carotid bruit  OROPHARYNX:  Moist, No exudate/ erythema/lesions.  Heart: Regular rate and rhythm, without murmurs, rubs, gallops, PMI non-displaced, no heaves or thrills on palpation.  Lungs: Clear to auscultation, no wheezing or rhonchi noted. No increased vocal fremitus resonant to percussion  Abdomen: Soft, nontender, nondistended, positive bowel sounds, no masses no hepatosplenomegaly noted..  Neuro: No focal neurological deficits noted cranial nerves II through XII grossly intact. Strength at functional baseline in bilateral upper and lower extremities. Musculoskeletal: No warmth swelling or erythema around joints, no spinal tenderness noted. Psychiatric: Patient alert and oriented x3, good insight and cognition, good recent to remote recall.    Data Reviewed: Basic Metabolic Panel:  Recent  Labs Lab 01/30/16 0416 01/31/16 0500  NA 138 138  K 3.4* 4.0  CL 108 109  CO2 24 25  GLUCOSE 95 95  BUN 7 7  CREATININE 0.72 0.68  CALCIUM 8.8* 8.5*   Liver Function Tests:  Recent Labs Lab 01/30/16 0416 01/31/16 0500  AST 22 19  ALT 20 17  ALKPHOS 54 50  BILITOT 2.6* 2.1*  PROT 7.3 6.3*  ALBUMIN 4.4 3.8    Recent Labs Lab 01/30/16 1315  LIPASE 31   No results for input(s): AMMONIA in the last 168 hours. CBC:  Recent Labs Lab 01/30/16 0416 01/31/16 0500 02/02/16 1342  WBC 11.2* 15.2* 15.1*  NEUTROABS 6.2 8.6* 10.1*  HGB 8.7* 7.0* 7.0*  HCT 25.2* 20.9* 20.4*  MCV 84.6 88.2 88.7  PLT 505* 491* 476*   Cardiac Enzymes: No results for input(s): CKTOTAL, CKMB, CKMBINDEX, TROPONINI in the last 168 hours. BNP (last 3 results) No results for input(s): BNP in the last 8760 hours.  ProBNP (last 3 results) No results for input(s): PROBNP in the last 8760 hours.  CBG: No results for input(s): GLUCAP in the last 168 hours.  No results found for this or any previous visit (from the past 240 hour(s)).   Studies: No results found.  Scheduled Meds: . enoxaparin (LOVENOX) injection  40 mg Subcutaneous Q24H  . HYDROmorphone   Intravenous Q4H  . hydroxyurea  1,000 mg Oral Daily  . Influenza vac split quadrivalent PF  0.5 mL Intramuscular Tomorrow-1000  . ketorolac  30 mg Intravenous Q6H  . senna-docusate  1 tablet Oral BID  . sodium chloride  1,000 mL Intravenous Once  . valACYclovir  1,000 mg Oral Daily   Continuous Infusions:   Principal Problem:   Sickle cell pain crisis ([name]) Active Problems:   Sickle cell disease ([name])   Hypokalemia   Nausea & vomiting     In excess of 25 minutes spent during this visit. Greater than 50% involved face to face contact with the patient for assessment, counseling and coordination of care.

## 2016-02-04 MED ORDER — OXYCODONE HCL 5 MG PO TABS
15.0000 mg | ORAL_TABLET | ORAL | Status: DC
  Administered 2016-02-04 – 2016-02-05 (×8): 15 mg via ORAL
  Filled 2016-02-04 (×8): qty 3

## 2016-02-04 NOTE — Care Management Important Message (Signed)
Important Message  Patient Details  Name: SABRIA SEEKINGS MRN: [id number] Date of Birth: April 10, 1991   Medicare Important Message Given:  Yes    Camillo Flaming 02/04/2016, 10:08 AMImportant Message  Patient Details  Name: DEICY HOLLAR MRN: [id number] Date of Birth: Sep 20, 1990   Medicare Important Message Given:  Yes    Camillo Flaming 02/04/2016, 10:08 AM

## 2016-02-05 LAB — BASIC METABOLIC PANEL
ANION GAP: 6 (ref 5–15)
BUN: 10 mg/dL (ref 6–20)
CALCIUM: 8.7 mg/dL — AB (ref 8.9–10.3)
CO2: 26 mmol/L (ref 22–32)
Chloride: 107 mmol/L (ref 101–111)
Creatinine, Ser: 0.57 mg/dL (ref 0.44–1.00)
GLUCOSE: 99 mg/dL (ref 65–99)
POTASSIUM: 4 mmol/L (ref 3.5–5.1)
Sodium: 139 mmol/L (ref 135–145)

## 2016-02-05 LAB — CBC WITH DIFFERENTIAL/PLATELET
Basophils Absolute: 0.1 10*3/uL (ref 0.0–0.1)
Basophils Relative: 0 %
Eosinophils Absolute: 1.2 10*3/uL — ABNORMAL HIGH (ref 0.0–0.7)
Eosinophils Relative: 10 %
HEMATOCRIT: 19 % — AB (ref 36.0–46.0)
HEMOGLOBIN: 6.6 g/dL — AB (ref 12.0–15.0)
LYMPHS ABS: 2.4 10*3/uL (ref 0.7–4.0)
LYMPHS PCT: 20 %
MCH: 30.3 pg (ref 26.0–34.0)
MCHC: 34.7 g/dL (ref 30.0–36.0)
MCV: 87.2 fL (ref 78.0–100.0)
MONO ABS: 1.3 10*3/uL — AB (ref 0.1–1.0)
MONOS PCT: 11 %
NEUTROS ABS: 7.2 10*3/uL (ref 1.7–7.7)
NEUTROS PCT: 59 %
Platelets: 449 10*3/uL — ABNORMAL HIGH (ref 150–400)
RBC: 2.18 MIL/uL — ABNORMAL LOW (ref 3.87–5.11)
RDW: 20.8 % — AB (ref 11.5–15.5)
WBC: 12.1 10*3/uL — ABNORMAL HIGH (ref 4.0–10.5)

## 2016-02-05 LAB — RETICULOCYTES
RBC.: 2.18 MIL/uL — ABNORMAL LOW (ref 3.87–5.11)
Retic Count, Absolute: 477.4 10*3/uL — ABNORMAL HIGH (ref 19.0–186.0)
Retic Ct Pct: 21.9 % — ABNORMAL HIGH (ref 0.4–3.1)

## 2016-02-05 MED ORDER — HEPARIN SOD (PORK) LOCK FLUSH 100 UNIT/ML IV SOLN
500.0000 [IU] | INTRAVENOUS | Status: AC | PRN
  Administered 2016-02-05: 500 [IU]
  Filled 2016-02-05: qty 5

## 2016-02-05 MED ORDER — HYDROXYUREA 500 MG PO CAPS: 1000.0000 mg | ORAL_CAPSULE | Freq: Every day | ORAL | 1 refills | Status: DC

## 2016-02-05 MED ORDER — SODIUM CHLORIDE 0.9% FLUSH
10.0000 mL | INTRAVENOUS | Status: AC | PRN
  Administered 2016-02-05: 10 mL

## 2016-02-05 NOTE — Progress Notes (Signed)
SICKLE CELL SERVICE PROGRESS NOTE  Nancy MCCAUSLAND [id number] DOB: Dec 27, 1990 DOA: 01/30/2016 PCP: Angelica Chessman, MD  Assessment/Plan: Principal Problem:   Sickle cell pain crisis ([place]) Active Problems:   Sickle cell disease ([place])   Hypokalemia   Nausea & vomiting  1. Hb SS with crisis: Pt reports pain as 6/10 and localized to low low back and legs.  I will continue PCA and Toradol at current dose and schedule Oxycodone. Methadone has been discontinued due prolonged QTc on EKG. Anticipate discharge home tomorrow. 2. Leukocytosis: No evidence of infection. Suspect that this is related to crisis. A chronic leukocytosis is a poor prognostic factor in SCD.  3. Anemia of Chronic Disease: Hb at baseline and stable. Hydrea was  increased to 1000 mg (13.5 mg/kg) daily 2 days ago as she is not at the MTD and actually subtherapeutic (6.7 mg/kg) for her weight. Pt agreeable to increase in dose. So far indices stable. She should have her labs checked at the  2 & 4 week interval.  If tolerating increased dose at 4 weeks should be increased to an alternating 1000 mg and 1500 mg QOD. At the time the dose would be therapeutic (approx16.9 mg/kg) and should be titrated no more frequently than every 12 weeks to MTD.  4. Chronic pain: Pt was on Methadone. It has been discontinued due to prolonged QTc. Pt has done well in the past with just short acting opiates when her Hydrea was therapeutic.   5. Psychosocial: Pt relates that she had a severe bout of post-partum depression and coped mostly by sleeping and eating. She is feeling better but feels that she still needs some psychosocial support.   Code Status: Full Code Family Communication: N/A Disposition Plan: Anticipate discharge tomorrow  [place] Schmeling A.  Pager [phone]. If 7PM-7AM, please contact night-coverage.  02/05/2016, 10:47 AM  LOS: 5 days   Interim History: Pt reports pain as 6/10 and localized to  low back and left arm. Pt has not  had a BM since admission. She has used 35 mg with 51/50:demands/deliveries in addition to 20 mg in the last 24 hours  Pt had a small BM last night.  Consultants:  None  Procedures:  None  Antibiotics:  None   Objective: Vitals:   02/04/16 2359 02/05/16 0448 02/05/16 0613 02/05/16 0801  BP:   113/77   Pulse:   98   Resp: 16 16 16 14   Temp:   98.6 F (37 C)   TempSrc:   Oral   SpO2: 98% 97% 100% 99%  Weight:   74.2 kg (163 lb 9.3 oz)   Height:       Weight change: 0.1 kg (3.5 oz)  Intake/Output Summary (Last 24 hours) at 02/05/16 1047 Last data filed at 02/05/16 0844  Gross per 24 hour  Intake              595 ml  Output              450 ml  Net              145 ml    General: Alert, awake, oriented x3, in no acute distress.  HEENT: [place]/AT PEERL, EOMI, anicteric Neck: Trachea midline,  no masses, no thyromegal,y no JVD, no carotid bruit OROPHARYNX:  Moist, No exudate/ erythema/lesions.  Heart: Regular rate and rhythm, without murmurs, rubs, gallops, PMI non-displaced, no heaves or thrills on palpation.  Lungs: Clear to auscultation, no wheezing or rhonchi noted. No increased  vocal fremitus resonant to percussion  Abdomen: Soft, nontender, nondistended, positive bowel sounds, no masses no hepatosplenomegaly noted..  Neuro: No focal neurological deficits noted cranial nerves II through XII grossly intact. Strength at functional baseline in bilateral upper and lower extremities. Musculoskeletal: No warmth swelling or erythema around joints, no spinal tenderness noted. Psychiatric: Patient alert and oriented x3, good insight and cognition, good recent to remote recall.    Data Reviewed: Basic Metabolic Panel:  Recent Labs Lab 01/30/16 0416 01/31/16 0500  NA 138 138  K 3.4* 4.0  CL 108 109  CO2 24 25  GLUCOSE 95 95  BUN 7 7  CREATININE 0.72 0.68  CALCIUM 8.8* 8.5*   Liver Function Tests:  Recent Labs Lab 01/30/16 0416 01/31/16 0500  AST 22 19  ALT 20  17  ALKPHOS 54 50  BILITOT 2.6* 2.1*  PROT 7.3 6.3*  ALBUMIN 4.4 3.8    Recent Labs Lab 01/30/16 1315  LIPASE 31   No results for input(s): AMMONIA in the last 168 hours. CBC:  Recent Labs Lab 01/30/16 0416 01/31/16 0500 02/02/16 1342  WBC 11.2* 15.2* 15.1*  NEUTROABS 6.2 8.6* 10.1*  HGB 8.7* 7.0* 7.0*  HCT 25.2* 20.9* 20.4*  MCV 84.6 88.2 88.7  PLT 505* 491* 476*   Cardiac Enzymes: No results for input(s): CKTOTAL, CKMB, CKMBINDEX, TROPONINI in the last 168 hours. BNP (last 3 results) No results for input(s): BNP in the last 8760 hours.  ProBNP (last 3 results) No results for input(s): PROBNP in the last 8760 hours.  CBG: No results for input(s): GLUCAP in the last 168 hours.  No results found for this or any previous visit (from the past 240 hour(s)).   Studies: No results found.  Scheduled Meds: . enoxaparin (LOVENOX) injection  40 mg Subcutaneous Q24H  . HYDROmorphone   Intravenous Q4H  . hydroxyurea  1,000 mg Oral Daily  . Influenza vac split quadrivalent PF  0.5 mL Intramuscular Tomorrow-1000  . ketorolac  30 mg Intravenous Q6H  . oxyCODONE  15 mg Oral Q4H  . senna-docusate  1 tablet Oral BID  . sodium chloride  1,000 mL Intravenous Once  . valACYclovir  1,000 mg Oral Daily   Continuous Infusions:   Principal Problem:   Sickle cell pain crisis ([name]) Active Problems:   Sickle cell disease ([name])   Hypokalemia   Nausea & vomiting     In excess of 25 minutes spent during this visit. Greater than 50% involved face to face contact with the patient for assessment, counseling and coordination of care.

## 2016-02-05 NOTE — Discharge Summary (Signed)
[name] MRN: [id number] DOB/AGE: 1990/09/29 25 y.o.  Admit date: 01/30/2016 Discharge date: 02/05/2016  Primary Care Physician:  Angelica Chessman, MD   Discharge Diagnoses:   Patient Active Problem List   Diagnosis Date Noted  . Depression 01/06/2011    Priority: High  . Sickle cell disease ([place]) 01/08/2009    Priority: High  . Chronic back pain   . Sickle cell anemia with crisis ([place]) 01/05/2016  . Nausea & vomiting 12/27/2015  . Sickle cell crisis ([place]) 11/09/2015  . Sepsis ([place]) 11/09/2015  . Thrombocytosis ([place]) 11/09/2015  . Vaginal bleeding 11/09/2015  . Hyperbilirubinemia 11/09/2015  . Pyrexia   . Vaginal delivery 10/31/2015  . Sickle cell pain crisis ([place]) 10/26/2015  . Positive GBS test 10/18/2015  . Narcotic dependence ([place]) 10/06/2015  . 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 High risk for intrapartum complications, antepartum 08/12/2015  . Chronic pain 08/04/2015  . Cocaine use 07/24/2015  . Maternal sickle cell anemia affecting pregnancy, antepartum ([place]) 07/14/2015  . Anemia of chronic disease   . Hypokalemia 10/29/2014  . 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 Migraines 11/08/2011  . GERD (gastroesophageal reflux disease) 02/17/2011  . Trichotillomania 01/08/2009    DISCHARGE MEDICATION:   Medication List    STOP taking these medications   acetaminophen 500 MG tablet Commonly known as:  TYLENOL   methadone 5 MG tablet Commonly known as:  DOLOPHINE     TAKE these medications   aspirin-acetaminophen-caffeine 250-250-65 MG tablet Commonly known as:  EXCEDRIN MIGRAINE Take 2 tablets by mouth every 6 (six) hours as needed for headache.   cyclobenzaprine 10 MG tablet Commonly known as:  FLEXERIL Take 1 tablet (10 mg total) by mouth 3 (three) times daily as needed for muscle spasms.   hydroxyurea 500 MG capsule Commonly known as:   HYDREA Take 2 capsules (1,000 mg total) by mouth daily. May take with food to minimize GI side effects. Start taking on:  02/06/2016 What changed:  how much to take   ibuprofen 600 MG tablet Commonly known as:  ADVIL,MOTRIN Take 600 mg by mouth every 6 (six) hours as needed for mild pain or moderate pain.   oxyCODONE 15 MG immediate release tablet Commonly known as:  ROXICODONE Take 1 tablet (15 mg total) by mouth every 4 (four) hours as needed for pain.   valACYclovir 500 MG tablet Commonly known as:  VALTREX Take 1,000 mg by mouth daily.         Consults:    SIGNIFICANT DIAGNOSTIC STUDIES:  No results found.     No results found for this or any previous visit (from the past 240 hour(s)).  BRIEF ADMITTING H & P: [name] is a 25 y.o. female with medical history significant of sickle cell disease, chronic back pain, GERD, depression, who presents with worsening back pain, nausea, vomiting.  Patient reports that he has been having nauseas and vomiting for 3days. He vomited 1 to 3 times each day with blood in the vomitus. Patient does not have diarrhea or abdominal pain. No symptoms of UTI. She states that her pain is also getting worse. She has beenpain all over, but worse on both side of the back. The back pain is constant, 10 out of 10 severity, nonradiating. No leg numbness or weakness. No incontinence of bladder or bowel movement. It is not aggravated or alleviated anginal factors. Patient denies chest pain,  shortness, cough, fever or chills.  ED Course: pt was found to have WBC 11.2, hemoglobin 8.7, temperature normal, tachycardia, but no tachypnea, potassium 3.4, creatinine normal. Patient is placed on MedSurg bed for observation.   Hospital Course:  Present on Admission: Patient is admitted with acute sickle cell crisis. She was managed with IV Dilaudid via PCA, Toradol and IV fluids. His pain is all she was transitioned to her oral analgesics.  The patient  had leukocytosis which was felt to be related to her crisis. At the time of discharge leukocytosis had resolved. Additionally the patient is on hydroxyurea and is on a subtherapeutic dose during his hospitalization her hydroxyurea was increased from 500 mg to 1000 mg daily. I've advised the patient that she should have her hemoglobin ANC reticulocytes checked in about 2 weeks. Tolerating the dose I would recommend that her dose be increased at 5 mg/kg daily  Every 6 weeks until she is at the maximum tolerated dose.during hospitalization she also had a mild hypokalemia and her potassium was replaced orally.  . Sickle cell pain crisis ([place]) . Nausea & vomiting . Hypokalemia . Sickle cell disease ([place])   Disposition and Follow-up:  Patient is discharged home in stable condition and is to follow-up with her primary care physician as scheduled. Discharge Instructions    Activity as tolerated - No restrictions    Complete by:  As directed    Diet general    Complete by:  As directed       DISCHARGE EXAM:  General: Alert, awake, oriented x3, in mild distress.  HEENT: [place]/AT PEERL, EOMI, anicteric Neck: Trachea midline, no masses, no thyromegal,y no JVD, no carotid bruit OROPHARYNX: Moist, No exudate/ erythema/lesions.  Heart: Regular rate and rhythm, without murmurs, rubs, gallops or S3. PMI non-displaced. Exam reveals no decreased pulses. Pulmonary/Chest: Normal effort. Breath sounds normal. No. Apnea. Clear to auscultation,no stridor,  no wheezing and no rhonchi noted. No respiratory distress and no tenderness noted. Abdomen: Soft, nontender, nondistended, normal bowel sounds, no masses no hepatosplenomegaly noted. No fluid wave and no ascites. There is no guarding or rebound. Neuro: Alert and oriented to person, place and time. Normal motor skills, Displays no atrophy or tremors and exhibits normal muscle tone.  No focal neurological deficits noted cranial nerves II through XII grossly  intact. No sensory deficit noted. Strength at baseline in bilateral upper and lower extremities. Gait normal. Musculoskeletal: No warmth swelling or erythema around joints, no spinal tenderness noted. Psychiatric: Patient alert and oriented x3, good insight and cognition, good recent to remote recall. Mood,  affect and judgement normal. Skin: Skin is warm and dry. No bruising, no ecchymosis and no rash noted. Pt is not diaphoretic. No erythema. No pallor    Blood pressure 103/76, pulse 96, temperature 98.9 F (37.2 C), temperature source Oral, resp. rate 14, height 5\' 9"  (1.753 m), weight 74.2 kg (163 lb 9.3 oz), SpO2 99 %, not currently breastfeeding.   Recent Labs  02/05/16 1100  NA 139  K 4.0  CL 107  CO2 26  GLUCOSE 99  BUN 10  CREATININE 0.57  CALCIUM 8.7*   No results for input(s): AST, ALT, ALKPHOS, BILITOT, PROT, ALBUMIN in the last 72 hours. No results for input(s): LIPASE, AMYLASE in the last 72 hours.  Recent Labs  02/02/16 1342 02/05/16 1100  WBC 15.1* 12.1*  NEUTROABS 10.1* 7.2  HGB 7.0* 6.6*  HCT 20.4* 19.0*  MCV 88.7 87.2  PLT 476* 449*  Total time spent including face to face and decision making was greater than 30 minutes  Signed: Natlie Asfour A. 02/05/2016, 1:31 PM

## 2016-02-05 NOTE — Progress Notes (Signed)
Discharge instructions reviewed with patient, questions answered, verbalized understanding.  Port a cath flushed as per protocol and deaccessed.  Patient transported via wheelchair to front of hospital to be taken home by family.

## 2016-02-09 ENCOUNTER — Other Ambulatory Visit: Payer: Self-pay | Admitting: Internal Medicine

## 2016-02-09 ENCOUNTER — Telehealth: Payer: Self-pay

## 2016-02-09 DIAGNOSIS — D571 Sickle-cell disease without crisis: Secondary | ICD-10-CM

## 2016-02-09 MED ORDER — OXYCODONE HCL 15 MG PO TABS: 15.0000 mg | ORAL_TABLET | ORAL | 0 refills | Status: DC | PRN

## 2016-02-09 MED ORDER — CYCLOBENZAPRINE HCL 10 MG PO TABS: 10.0000 mg | ORAL_TABLET | Freq: Three times a day (TID) | ORAL | 0 refills | Status: DC | PRN

## 2016-02-16 ENCOUNTER — Non-Acute Institutional Stay (HOSPITAL_BASED_OUTPATIENT_CLINIC_OR_DEPARTMENT_OTHER)
Admission: AD | Admit: 2016-02-16 | Discharge: 2016-02-16 | Disposition: A | Discharge status: Home / Self Care | Payer: Medicare Other | Source: Ambulatory Visit | Attending: Internal Medicine | Admitting: Internal Medicine

## 2016-02-16 ENCOUNTER — Encounter (HOSPITAL_COMMUNITY): Payer: Self-pay | Admitting: *Deleted

## 2016-02-16 ENCOUNTER — Encounter (HOSPITAL_COMMUNITY): Payer: Self-pay | Admitting: Emergency Medicine

## 2016-02-16 ENCOUNTER — Emergency Department (HOSPITAL_COMMUNITY)
Admission: EM | Admit: 2016-02-16 | Discharge: 2016-02-16 | Disposition: A | Discharge status: Home / Self Care | Payer: Medicare Other | Attending: Emergency Medicine | Admitting: Emergency Medicine

## 2016-02-16 ENCOUNTER — Other Ambulatory Visit: Payer: Self-pay | Admitting: Internal Medicine

## 2016-02-16 DIAGNOSIS — M25521 Pain in right elbow: Secondary | ICD-10-CM | POA: Diagnosis not present

## 2016-02-16 DIAGNOSIS — M79601 Pain in right arm: Secondary | ICD-10-CM | POA: Diagnosis present

## 2016-02-16 DIAGNOSIS — D5700 Hb-SS disease with crisis, unspecified: Secondary | ICD-10-CM | POA: Insufficient documentation

## 2016-02-16 DIAGNOSIS — Z91040 Latex allergy status: Secondary | ICD-10-CM | POA: Diagnosis not present

## 2016-02-16 DIAGNOSIS — Z79899 Other long term (current) drug therapy: Secondary | ICD-10-CM | POA: Insufficient documentation

## 2016-02-16 DIAGNOSIS — Z87891 Personal history of nicotine dependence: Secondary | ICD-10-CM | POA: Insufficient documentation

## 2016-02-16 DIAGNOSIS — Z79891 Long term (current) use of opiate analgesic: Secondary | ICD-10-CM

## 2016-02-16 DIAGNOSIS — Z7982 Long term (current) use of aspirin: Secondary | ICD-10-CM | POA: Diagnosis not present

## 2016-02-16 LAB — RETICULOCYTES
RBC.: 2.72 MIL/uL — ABNORMAL LOW (ref 3.87–5.11)
RETIC COUNT ABSOLUTE: 312.8 10*3/uL — AB (ref 19.0–186.0)
Retic Ct Pct: 11.5 % — ABNORMAL HIGH (ref 0.4–3.1)

## 2016-02-16 LAB — CBC WITH DIFFERENTIAL/PLATELET
BASOS PCT: 1 %
Basophils Absolute: 0.1 10*3/uL (ref 0.0–0.1)
EOS PCT: 8 %
Eosinophils Absolute: 1.1 10*3/uL — ABNORMAL HIGH (ref 0.0–0.7)
HEMATOCRIT: 23.4 % — AB (ref 36.0–46.0)
Hemoglobin: 8.2 g/dL — ABNORMAL LOW (ref 12.0–15.0)
Lymphocytes Relative: 23 %
Lymphs Abs: 3.2 10*3/uL (ref 0.7–4.0)
MCH: 30.4 pg (ref 26.0–34.0)
MCHC: 35 g/dL (ref 30.0–36.0)
MCV: 86.7 fL (ref 78.0–100.0)
MONO ABS: 1.1 10*3/uL — AB (ref 0.1–1.0)
Monocytes Relative: 8 %
NEUTROS ABS: 8.5 10*3/uL — AB (ref 1.7–7.7)
NEUTROS PCT: 60 %
Platelets: 563 10*3/uL — ABNORMAL HIGH (ref 150–400)
RBC: 2.7 MIL/uL — ABNORMAL LOW (ref 3.87–5.11)
RDW: 18.5 % — AB (ref 11.5–15.5)
WBC: 14 10*3/uL — ABNORMAL HIGH (ref 4.0–10.5)

## 2016-02-16 LAB — COMPREHENSIVE METABOLIC PANEL
ALBUMIN: 4.3 g/dL (ref 3.5–5.0)
ALT: 19 U/L (ref 14–54)
AST: 24 U/L (ref 15–41)
Alkaline Phosphatase: 52 U/L (ref 38–126)
Anion gap: 6 (ref 5–15)
BUN: 10 mg/dL (ref 6–20)
CHLORIDE: 108 mmol/L (ref 101–111)
CO2: 24 mmol/L (ref 22–32)
CREATININE: 0.58 mg/dL (ref 0.44–1.00)
Calcium: 8.9 mg/dL (ref 8.9–10.3)
GFR calc non Af Amer: >60 mL/min (ref >60)
GLUCOSE: 99 mg/dL (ref 65–99)
Potassium: 3.8 mmol/L (ref 3.5–5.1)
SODIUM: 138 mmol/L (ref 135–145)
Total Bilirubin: 1.9 mg/dL — ABNORMAL HIGH (ref 0.3–1.2)
Total Protein: 7.3 g/dL (ref 6.5–8.1)

## 2016-02-16 MED ORDER — ONDANSETRON HCL 4 MG/2ML IJ SOLN
4.0000 mg | Freq: Once | INTRAMUSCULAR | Status: AC
Start: 2016-02-16 — End: 2016-02-16
  Administered 2016-02-16: 4 mg via INTRAVENOUS
  Filled 2016-02-16: qty 2

## 2016-02-16 MED ORDER — HYDROMORPHONE HCL 2 MG/ML IJ SOLN: 0.0250 mg/kg | INTRAMUSCULAR | Status: AC

## 2016-02-16 MED ORDER — SODIUM CHLORIDE 0.9 % IV SOLN
25.0000 mg | INTRAVENOUS | Status: DC | PRN
  Filled 2016-02-16: qty 0.5

## 2016-02-16 MED ORDER — HEPARIN SOD (PORK) LOCK FLUSH 100 UNIT/ML IV SOLN
500.0000 [IU] | Freq: Once | INTRAVENOUS | Status: DC
  Filled 2016-02-16: qty 5

## 2016-02-16 MED ORDER — ONDANSETRON HCL 4 MG/2ML IJ SOLN
4.0000 mg | Freq: Four times a day (QID) | INTRAMUSCULAR | Status: DC | PRN
Start: 2016-02-16 — End: 2016-02-17

## 2016-02-16 MED ORDER — KETOROLAC TROMETHAMINE 30 MG/ML IJ SOLN
30.0000 mg | Freq: Four times a day (QID) | INTRAMUSCULAR | Status: DC
  Administered 2016-02-16: 30 mg via INTRAVENOUS
  Filled 2016-02-16: qty 1

## 2016-02-16 MED ORDER — SENNOSIDES-DOCUSATE SODIUM 8.6-50 MG PO TABS
1.0000 | ORAL_TABLET | Freq: Two times a day (BID) | ORAL | Status: DC
  Administered 2016-02-16: 1 via ORAL
  Filled 2016-02-16: qty 1

## 2016-02-16 MED ORDER — DIPHENHYDRAMINE HCL 25 MG PO CAPS: 25.0000 mg | ORAL_CAPSULE | ORAL | Status: DC | PRN

## 2016-02-16 MED ORDER — HYDROMORPHONE HCL 2 MG/ML IJ SOLN
0.0250 mg/kg | INTRAMUSCULAR | Status: AC
  Administered 2016-02-16: 1.8 mg via INTRAVENOUS
  Filled 2016-02-16: qty 1

## 2016-02-16 MED ORDER — MORPHINE SULFATE ER 30 MG PO TBCR: 30.0000 mg | EXTENDED_RELEASE_TABLET | Freq: Two times a day (BID) | ORAL | 0 refills | Status: AC

## 2016-02-16 MED ORDER — SODIUM CHLORIDE 0.9% FLUSH
9.0000 mL | INTRAVENOUS | Status: DC | PRN
  Administered 2016-02-16: 10 mL via INTRAVENOUS
  Filled 2016-02-16: qty 9

## 2016-02-16 MED ORDER — DEXTROSE-NACL 5-0.45 % IV SOLN
INTRAVENOUS | Status: DC
  Administered 2016-02-16: 14:00:00 via INTRAVENOUS

## 2016-02-16 MED ORDER — HYDROMORPHONE 1 MG/ML IV SOLN
INTRAVENOUS | Status: DC
  Administered 2016-02-16: 14 mg via INTRAVENOUS
  Administered 2016-02-16: 11:00:00 via INTRAVENOUS
  Filled 2016-02-16: qty 25

## 2016-02-16 MED ORDER — POLYETHYLENE GLYCOL 3350 17 G PO PACK: 17.0000 g | PACK | Freq: Every day | ORAL | Status: DC | PRN

## 2016-02-16 MED ORDER — NALOXONE HCL 0.4 MG/ML IJ SOLN: 0.4000 mg | INTRAMUSCULAR | Status: DC | PRN

## 2016-02-16 MED ORDER — DEXTROSE-NACL 5-0.45 % IV SOLN
INTRAVENOUS | Status: DC
  Administered 2016-02-16 (×2): via INTRAVENOUS

## 2016-02-16 MED ORDER — HYDROMORPHONE HCL 2 MG/ML IJ SOLN
2.0000 mg | Freq: Once | INTRAMUSCULAR | Status: AC
  Administered 2016-02-16: 2 mg via INTRAVENOUS
  Filled 2016-02-16: qty 1

## 2016-02-16 MED ORDER — HEPARIN SOD (PORK) LOCK FLUSH 100 UNIT/ML IV SOLN
500.0000 [IU] | INTRAVENOUS | Status: AC | PRN
  Administered 2016-02-16: 500 [IU]
  Filled 2016-02-16: qty 5

## 2016-02-16 MED ORDER — DIPHENHYDRAMINE HCL 50 MG/ML IJ SOLN
25.0000 mg | Freq: Once | INTRAMUSCULAR | Status: AC
  Administered 2016-02-16: 25 mg via INTRAVENOUS
  Filled 2016-02-16: qty 1

## 2016-02-16 MED ORDER — HYDROMORPHONE HCL 2 MG/ML IJ SOLN
2.0000 mg | Freq: Once | INTRAMUSCULAR | Status: AC
Start: 2016-02-16 — End: 2016-02-16
  Administered 2016-02-16: 2 mg via INTRAVENOUS
  Filled 2016-02-16: qty 1

## 2016-02-16 MED ORDER — KETOROLAC TROMETHAMINE 15 MG/ML IJ SOLN
15.0000 mg | Freq: Once | INTRAMUSCULAR | Status: AC
  Administered 2016-02-16: 15 mg via INTRAVENOUS
  Filled 2016-02-16: qty 1

## 2016-02-16 NOTE — ED Triage Notes (Signed)
Pt states that today she started having pain in her legs and her R arm. Leg pain subsided but has come back. Arm hurts worst. Feels like sickle cell pain. Alert and oriented.

## 2016-02-16 NOTE — ED Provider Notes (Signed)
Signed out by Dr Ralene Bathe at [facility] that plan is for patient to go to [facility] this AM.  I spoke with Dr Doreene Burke, who accepts patient, indicating we can send her to clinic now.  Patient to be transported to the clinic from the ED.  Patient is awake and alert. Appears comfortable. Current BP 105/64.    Lajean Saver, MD 02/16/16 [phone]

## 2016-02-16 NOTE — Discharge Instructions (Signed)

## 2016-02-16 NOTE — Discharge Summary (Signed)
Physician Discharge Summary  [name] [id number] DOB: Nov 01, 1990 DOA: 02/16/2016  PCP: Angelica Chessman, MD  Admit date: 02/16/2016  Discharge date: 02/16/2016  Time spent: 30 minutes  Discharge Diagnoses:  Active Problems:   Sickle cell anemia with pain [facility])   Discharge Condition: Stable  Diet recommendation: Regular  Filed Weights   02/16/16 1025  Weight: 163 lb (73.9 kg)    History of present illness:  [name] is a 25 y.o. female with history of sickle cell disease, who initially presented to the Emergency Department complaining of gradual onset, constant sickle cell pain in right arm and right leg onset 6 PM last night. Pain began in her knees bilaterally and right arm. Pt went for a walk and her knee pain subsided, but right arm pain began to gradually worsen. She went to bed at 9 PM after taking 15 mg of Oxycodone. Pt woke up 12 AM with increased right arm pain. Denies chest pain, fever, diarrhea, abdominal pain. She was managed in the ED with IVF and pain medications but pain persists and she was transitioned to the day hospital for extended observation and continued pain control. She has no urinary symptom, no joint swelling or redness. She developed prolonged QTc which was thought to be from Methadone, currently on hold, going to start a different long acting narcotic  Hospital Course:  CHEYENNE INGEMI was admitted to the day hospital with sickle cell painful crisis. Patient was treated with weight based IV Dilaudid PCA, IV Toradol as well as IV fluids. Lillyanne showed significant improvement symptomatically, pain improved from 8 to 4/10 at the time of discharge. Patient was discharged home is a hemodynamically stable condition. [place] will follow-up at the clinic as previously scheduled, continue with home medications as per prior to admission.  Discharge Instructions We discussed the need for good hydration, monitoring of hydration status, avoidance  of heat, cold, stress, and infection triggers. We discussed the need to be compliant with taking Hydrea. Norissa was reminded of the need to seek medical attention of any symptoms of bleeding, anemia, or infection occurs.  Discharge Exam: Vitals:   02/16/16 1339 02/16/16 1443  BP: 124/79 118/68  Pulse: (!) 109 (!) 103  Resp: 16 12  Temp:     General appearance: alert, cooperative and no distress Eyes: conjunctivae/corneas clear. PERRL, EOM's intact. Fundi benign. Neck: no adenopathy, no carotid bruit, no JVD, supple, symmetrical, trachea midline and thyroid not enlarged, symmetric, no tenderness/mass/nodules Back: symmetric, no curvature. ROM normal. No CVA tenderness. Resp: clear to auscultation bilaterally Chest wall: no tenderness Cardio: regular rate and rhythm, S1, S2 normal, no murmur, click, rub or gallop GI: soft, non-tender; bowel sounds normal; no masses, no organomegaly Extremities: extremities normal, atraumatic, no cyanosis or edema Pulses: 2+ and symmetric Skin: Skin color, texture, turgor normal. No rashes or lesions Neurologic: Grossly normal   Current Discharge Medication List    CONTINUE these medications which have NOT CHANGED   Details  aspirin-acetaminophen-caffeine (EXCEDRIN MIGRAINE) 250-250-65 MG tablet Take 2 tablets by mouth every 6 (six) hours as needed for headache.     cyclobenzaprine (FLEXERIL) 10 MG tablet Take 1 tablet (10 mg total) by mouth 3 (three) times daily as needed for muscle spasms. Qty: 60 tablet, Refills: 0    hydroxyurea (HYDREA) 500 MG capsule Take 2 capsules (1,000 mg total) by mouth daily. May take with food to minimize GI side effects. Qty: 60 capsule, Refills: 1    oxyCODONE (ROXICODONE) 15 MG  immediate release tablet Take 1 tablet (15 mg total) by mouth every 4 (four) hours as needed for pain. Qty: 90 tablet, Refills: 0   Associated Diagnoses: Hb-SS disease without crisis ([name])    ibuprofen (ADVIL,MOTRIN) 600 MG tablet Take  600 mg by mouth every 6 (six) hours as needed for mild pain or moderate pain.       Allergies  Allergen Reactions  . Food Hives and Other (See Comments)    Pt states that she is allergic to carrots.   . Latex Rash    Significant Diagnostic Studies: No results found.  Signed:  Angelica Chessman MD, [place], [place], [place], [place]   02/16/2016, 3:20 PM

## 2016-02-16 NOTE — H&P (Signed)
[facility] History and Physical  [name] [id number] DOB: 1990/11/26 DOA: 02/16/2016  PCP: Angelica Chessman, MD   Chief Complaint: Transitioned from ED for Sickle Cell pain  HPI: [name] is a 25 y.o. female with history of sickle cell disease, who initially presented to the Emergency Department complaining of gradual onset, constant sickle cell pain in right arm and right leg onset 6 PM last night. Pain began in her knees bilaterally and right arm. Pt went for a walk and her knee pain subsided, but right arm pain began to gradually worsen. She went to bed at 9 PM after taking 15 mg of Oxycodone. Pt woke up 12 AM with increased right arm pain. Denies chest pain, fever, diarrhea, abdominal pain. She was managed in the ED with IVF and pain medications but pain persists and she was transitioned to the day hospital for extended observation and continued pain control. She has no urinary symptom, no joint swelling or redness. She developed prolonged QTc which was thought to be from Methadone, currently on hold, going to start a different long acting narcotic.   Systemic Review: General: The patient denies anorexia, fever, weight loss Cardiac: Denies chest pain, syncope, palpitations, pedal edema  Respiratory: Denies cough, shortness of breath, wheezing GI: Denies severe indigestion/heartburn, abdominal pain, nausea, vomiting, diarrhea and constipation GU: Denies hematuria, incontinence, dysuria  Musculoskeletal: Denies arthritis  Skin: Denies suspicious skin lesions Neurologic: Denies focal weakness or numbness, change in vision  Past Medical History:  Diagnosis Date  . Blood transfusion    "lots"  . Blood transfusion without reported diagnosis   . Chronic back pain    "very severe; have knot in my back; from tight muscle; take RX and exercise for it"  . Depression 01/06/2011  . Exertional dyspnea    "sometimes"  . Genital HSV   . GERD (gastroesophageal  reflux disease) 02/17/2011  . Migraines 11/08/11   "@ least twice/month"  . 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  . Mood swings ([place]) 11/08/11   "I go back and forth; real bad"  . Sickle cell anemia ([name])   . Sickle cell anemia with crisis ([place])   . Trichotillomania    h/o    Past Surgical History:  Procedure Laterality Date  . CHOLECYSTECTOMY  05/2010  . DILATION AND CURETTAGE OF UTERUS  02/20/11   S/P miscarriage  . IR GENERIC HISTORICAL  12/23/2015   IR FLUORO GUIDE CV LINE RIGHT 12/23/2015 Jacqulynn Cadet, MD [facility]-INTERV RAD  . IR GENERIC HISTORICAL  12/23/2015   IR US GUIDE VASC ACCESS RIGHT 12/23/2015 Jacqulynn Cadet, MD [facility]-INTERV RAD    Allergies  Allergen Reactions  . Food Hives and Other (See Comments)    Pt states that she is allergic to carrots.   . Latex Rash    Family History  Problem Relation Age of Onset  . Sickle cell trait Mother   . Sickle cell trait Father   . Diabetes Maternal Grandmother   . Diabetes Paternal Grandmother   . Hypertension Paternal Grandmother   . Diabetes Maternal Grandfather       Prior to Admission medications   Medication Sig Start Date End Date Taking? Authorizing Provider  aspirin-acetaminophen-caffeine (EXCEDRIN MIGRAINE) [phone] MG tablet Take 2 tablets by mouth every 6 (six) hours as needed for headache.    Yes Historical Provider, MD  cyclobenzaprine (FLEXERIL) 10 MG tablet Take 1 tablet (10 mg total) by mouth 3 (three) times daily as  needed for muscle spasms. 02/09/16  Yes Tresa Garter, MD  hydroxyurea (HYDREA) 500 MG capsule Take 2 capsules (1,000 mg total) by mouth daily. May take with food to minimize GI side effects. 02/06/16  Yes [name], MD  oxyCODONE (ROXICODONE) 15 MG immediate release tablet Take 1 tablet (15 mg total) by mouth every 4 (four) hours as needed for pain. 02/09/16 02/24/16 Yes Tresa Garter, MD  ibuprofen (ADVIL,MOTRIN) 600 MG tablet Take 600 mg by mouth every 6 (six) hours  as needed for mild pain or moderate pain.    Historical Provider, MD     Physical Exam: Vitals:   02/16/16 1239 02/16/16 1339 02/16/16 1443 02/16/16 1602  BP: 115/62 124/79 118/68   Pulse: (!) 107 (!) 109 (!) 103   Resp: 12 16 12 15   Temp:      TempSrc:      SpO2: 98% 99% 99% 99%  Weight:      Height:        General: Alert, awake, afebrile, anicteric, not in obvious distress HEENT: Normocephalic and Atraumatic, Mucous membranes pink                PERRLA; EOM intact; No scleral icterus,                 Nares: Patent, Oropharynx: Clear, Fair Dentition                 Neck: FROM, no cervical lymphadenopathy, thyromegaly, carotid bruit or JVD;  CHEST WALL: No tenderness  CHEST: Normal respiration, clear to auscultation bilaterally  HEART: Regular rate and rhythm; no murmurs rubs or gallops  BACK: No kyphosis or scoliosis; no CVA tenderness  ABDOMEN: Positive Bowel Sounds, soft, non-tender; no masses, no organomegaly EXTREMITIES: No cyanosis, clubbing, or edema SKIN:  no rash or ulceration  CNS: Alert and Oriented x 4, Nonfocal exam, CN 2-12 intact  Labs on Admission:  Basic Metabolic Panel:  Recent Labs Lab 02/16/16 0146  NA 138  K 3.8  CL 108  CO2 24  GLUCOSE 99  BUN 10  CREATININE 0.58  CALCIUM 8.9   Liver Function Tests:  Recent Labs Lab 02/16/16 0146  AST 24  ALT 19  ALKPHOS 52  BILITOT 1.9*  PROT 7.3  ALBUMIN 4.3   No results for input(s): LIPASE, AMYLASE in the last 168 hours. No results for input(s): AMMONIA in the last 168 hours. CBC:  Recent Labs Lab 02/16/16 0146  WBC 14.0*  NEUTROABS 8.5*  HGB 8.2*  HCT 23.4*  MCV 86.7  PLT 563*   Cardiac Enzymes: No results for input(s): CKTOTAL, CKMB, CKMBINDEX, TROPONINI in the last 168 hours.  BNP (last 3 results) No results for input(s): BNP in the last 8760 hours.  ProBNP (last 3 results) No results for input(s): PROBNP in the last 8760 hours.  CBG: No results for input(s): GLUCAP in the  last 168 hours.   Assessment/Plan Active Problems:   Sickle cell anemia with pain ([place])   Admits to the Day Hospital  IVF D5 .45% Saline @ 125 mls/hour  Weight based Dilaudid PCA started within 30 minutes of admission  IV Toradol 30 mg Q 6 H  Monitor vitals very closely, Re-evaluate pain scale every hour  2 L of Oxygen by [place]  Patient will be re-evaluated for pain in the context of function and relationship to baseline as care progresses.  If no significant relieve from pain (remains above 5/10) will transfer patient to inpatient  services for further evaluation and management  Code Status: Full  Family Communication: None  DVT Prophylaxis: Ambulate as tolerated   Time spent: 61 Minutes  Alto Gandolfo, MD, MHA, FACP, FAAP, CPE  If 7PM-7AM, please contact night-coverage [email or web address] 02/16/2016,

## 2016-02-16 NOTE — Discharge Instructions (Signed)
Go directly to the [facility] Cell Clinic.   You were given pain medication in the ER - no driving for the next 8 hours, or any time when taking narcotic pain medication.

## 2016-02-16 NOTE — ED Notes (Signed)
Pt's port continues with D5NS for d/c to sickle cell center.

## 2016-02-16 NOTE — ED Notes (Signed)
Pt awake and states she wishes to go to sickle cell clinic instead of admission. Pt resting comfortably.

## 2016-02-16 NOTE — Progress Notes (Signed)
Admitted to [facility] from [facility] ED for complaints of 9/10 pain in arms. Port was accessed from the ED. Treated with IV fluids, IV Toradol and PCA Dilaudid. Pain level down to 5/10 at time of discharge and patient feels that she can manage her pain at home with po pain medicine. Went over discharge instructions and copy given to patient. Alert, oriented and ambulatory at time of discharge. Patient has a ride waiting for her.

## 2016-02-16 NOTE — ED Provider Notes (Signed)
[facility] DEPT Provider Note   CSN: [id number] Arrival date & time: 02/16/16  0106  By signing my name below, I, Gwenlyn Fudge, attest that this documentation has been prepared under the direction and in the presence of Quintella Reichert, MD. Electronically Signed: Gwenlyn Fudge, ED Scribe. 02/16/16. 2:03 AM.   History   Chief Complaint Chief Complaint  Patient presents with  . Sickle Cell Pain Crisis   The history is provided by the patient. No language interpreter was used.    HPI Comments: [name] is a 25 y.o. female with PMHx of Sickle Cell Anemia who presents to the Emergency Department complaining of gradual onset, constant sickle cell pain in right arm and right leg onset 6 PM tonight. Pain began in her knees bilaterally and right arm. Pt went for a walk and her knee pain subsided, but right arm pain began to gradually worsen. She went to bed at 9 PM after taking 15 mg of Oxycodone. Pt woke up 12 AM with increased right arm pain. She was taken off her Methadone by her PCP 10 days ago. Pt states she is currently receiving a Depo shot. Denies chest pain, fever, diarrhea, abdominal pain.  Past Medical History:  Diagnosis Date  . Blood transfusion    "lots"  . Blood transfusion without reported diagnosis   . Chronic back pain    "very severe; have knot in my back; from tight muscle; take RX and exercise for it"  . Depression 01/06/2011  . Exertional dyspnea    "sometimes"  . Genital HSV   . GERD (gastroesophageal reflux disease) 02/17/2011  . Migraines 11/08/11   "@ least twice/month"  . 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  . Mood swings ([place]) 11/08/11   "I go back and forth; real bad"  . Sickle cell anemia ([name])   . Sickle cell anemia with crisis ([place])   . Trichotillomania    h/o    Patient Active Problem List   Diagnosis Date Noted  . Chronic back pain   . Sickle cell anemia with crisis ([place]) 01/05/2016  . Nausea & vomiting 12/27/2015  .  Sickle cell crisis ([place]) 11/09/2015  . Sepsis ([place]) 11/09/2015  . Thrombocytosis ([place]) 11/09/2015  . Vaginal bleeding 11/09/2015  . Hyperbilirubinemia 11/09/2015  . Pyrexia   . Vaginal delivery 10/31/2015  . Sickle cell pain crisis ([place]) 10/26/2015  . Positive GBS test 10/18/2015  . Narcotic dependence ([place]) 10/06/2015  . 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 High risk for intrapartum complications, antepartum 08/12/2015  . Chronic pain 08/04/2015  . Cocaine use 07/24/2015  . Maternal sickle cell anemia affecting pregnancy, antepartum ([place]) 07/14/2015  . Anemia of chronic disease   . Hypokalemia 10/29/2014  . 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 Migraines 11/08/2011  . GERD (gastroesophageal reflux disease) 02/17/2011  . Depression 01/06/2011  . Sickle cell disease ([place]) 01/08/2009  . 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 CHOLECYSTECTOMY  05/2010  . DILATION AND CURETTAGE OF UTERUS  02/20/11   S/P miscarriage  . IR GENERIC HISTORICAL  12/23/2015   IR FLUORO GUIDE CV LINE RIGHT 12/23/2015 Jacqulynn Cadet, MD [facility]  . IR GENERIC HISTORICAL  12/23/2015   IR US GUIDE VASC ACCESS RIGHT 12/23/2015 Jacqulynn Cadet, MD [facility]    OB History    Gravida Para Term Preterm AB Living  3 2 2   1 2    SAB TAB Ectopic Multiple Live Births   1     0 2      Obstetric Comments   Miscarried in October 2012 at about 7 weeks       Home Medications    Prior to Admission medications   Medication Sig Start Date End Date Taking? Authorizing Provider  aspirin-acetaminophen-caffeine (EXCEDRIN MIGRAINE) [phone] MG tablet Take 2 tablets by mouth every 6 (six) hours as needed for headache.    Yes Historical Provider, MD  cyclobenzaprine (FLEXERIL) 10 MG tablet Take 1 tablet (10 mg total) by mouth 3 (three) times daily as needed  for muscle spasms. 02/09/16  Yes Tresa Garter, MD  hydroxyurea (HYDREA) 500 MG capsule Take 2 capsules (1,000 mg total) by mouth daily. May take with food to minimize GI side effects. 02/06/16  Yes [name], MD  ibuprofen (ADVIL,MOTRIN) 600 MG tablet Take 600 mg by mouth every 6 (six) hours as needed for mild pain or moderate pain.   Yes Historical Provider, MD  oxyCODONE (ROXICODONE) 15 MG immediate release tablet Take 1 tablet (15 mg total) by mouth every 4 (four) hours as needed for pain. 02/09/16 02/24/16 Yes Tresa Garter, MD    Family History Family History  Problem Relation Age of Onset  . Sickle cell trait Mother   . Sickle cell trait Father   . Diabetes Maternal Grandmother   . Diabetes Paternal Grandmother   . Hypertension Paternal Grandmother   . Diabetes Maternal Grandfather     Social History Social History  Substance Use Topics  . Smoking status: Former Smoker    Packs/day: 0.25    Years: 1.00    Types: Cigarettes    Quit date: 03/25/2013  . Smokeless tobacco: Never Used  . Alcohol use No     Comment: pt states she quit marijuan in May 2013. Rare ETOH, + cigarettes.  She is enrolled in school     Allergies   Food and Latex   Review of Systems Review of Systems  Constitutional: Negative for fever.  Musculoskeletal: Positive for arthralgias.  All other systems reviewed and are negative.    Physical Exam Updated Vital Signs BP 113/73   Pulse 106   Temp 98.5 F (36.9 C) (Oral)   Resp 17   Ht 5\' 1"  (1.549 m)   Wt 163 lb (73.9 kg)   SpO2 96%   BMI 30.80 kg/m   Physical Exam  Constitutional: She is oriented to person, place, and time. She appears well-developed and well-nourished.  HENT:  Head: Normocephalic and atraumatic.  Cardiovascular: Normal rate and regular rhythm.   No murmur heard. 2+ Radial pulse  Pulmonary/Chest: Effort normal and breath sounds normal. No respiratory distress.  Abdominal: Soft. There is no  tenderness. There is no rebound and no guarding.  Musculoskeletal: She exhibits no edema or tenderness.  Tenderness to the right elbow and knee without erythema or edema [place] in the right anterior chest wall  Neurological: She is alert and oriented to person, place, and time.  Skin: Skin is warm and dry.  Psychiatric: She has a normal mood and affect. Her behavior is normal.  Nursing note and vitals reviewed.  ED Treatments / Results  DIAGNOSTIC STUDIES: Oxygen Saturation is 100% on RA, normal by my interpretation.    COORDINATION OF CARE: 1:56 AM Discussed treatment plan with pt at bedside which includes EKG, Dilaudid and lab work and pt agreed to  plan.  Labs (all labs ordered are listed, but only abnormal results are displayed) Labs Reviewed  COMPREHENSIVE METABOLIC PANEL - Abnormal; Notable for the following:       Result Value   Total Bilirubin 1.9 (*)    All other components within normal limits  CBC WITH DIFFERENTIAL/PLATELET - Abnormal; Notable for the following:    WBC 14.0 (*)    RBC 2.70 (*)    Hemoglobin 8.2 (*)    HCT 23.4 (*)    RDW 18.5 (*)    Platelets 563 (*)    Neutro Abs 8.5 (*)    Monocytes Absolute 1.1 (*)    Eosinophils Absolute 1.1 (*)    All other components within normal limits  RETICULOCYTES - Abnormal; Notable for the following:    Retic Ct Pct 11.5 (*)    RBC. 2.72 (*)    Retic Count, Manual 312.8 (*)    All other components within normal limits    EKG  EKG Interpretation  Date/Time:  Tuesday February 16 2016 02:28:21 EDT Ventricular Rate:  97 PR Interval:    QRS Duration: 93 QT Interval:  350 QTC Calculation: 445 R Axis:   60 Text Interpretation:  Sinus rhythm Probable left atrial enlargement Confirmed by Hazle Coca [id number]) on 02/16/2016 2:46:00 AM       Radiology No results found.  Procedures Procedures (including critical care time)  Medications Ordered in ED Medications  dextrose 5 %-0.45 % sodium chloride infusion (  Intravenous New Bag/Given 02/16/16 0400)  HYDROmorphone (DILAUDID) injection 1.8 mg (1.8 mg Intravenous Given 02/16/16 0230)    Or  HYDROmorphone (DILAUDID) injection 1.8 mg ( Subcutaneous See Alternative 02/16/16 0230)  diphenhydrAMINE (BENADRYL) injection 25 mg (25 mg Intravenous Given 02/16/16 0231)  ondansetron (ZOFRAN) injection 4 mg (4 mg Intravenous Given 02/16/16 0314)  HYDROmorphone (DILAUDID) injection 2 mg (2 mg Intravenous Given 02/16/16 0315)  ketorolac (TORADOL) 15 MG/ML injection 15 mg (15 mg Intravenous Given 02/16/16 0314)  HYDROmorphone (DILAUDID) injection 2 mg (2 mg Intravenous Given 02/16/16 0355)  diphenhydrAMINE (BENADRYL) injection 25 mg (25 mg Intravenous Given 02/16/16 0355)     Initial Impression / Assessment and Plan / ED Course  I have reviewed the triage vital signs and the nursing notes.  Pertinent labs & imaging results that were available during my care of the patient were reviewed by me and considered in my medical decision making (see chart for details).  Clinical Course    Patient with history of sickle cell disease here with pain that is typical for her pain crises. No evidence of acute infectious process at this time. Her labs are stable. Patient received multiple doses of pain medications in the emergency department. She is improved but complains of ongoing pain and would like to be transferred to the sickle cell center (that is not currently open). She declines admission to the hospital for pain crisis. Plan to have her wait in the emergency department until sickle cell center opens for further evaluation. Patient care transferred pending reassessment.  Final Clinical Impressions(s) / ED Diagnoses   Final diagnoses:  None   I personally performed the services described in this documentation, which was scribed in my presence. The recorded information has been reviewed and is accurate.    New Prescriptions New Prescriptions   No medications on file       Quintella Reichert, MD 02/16/16 [phone]

## 2016-02-18 MED FILL — MORPHINE SULF 30 MG TAB SA: 30 | 15 days supply | Qty: 30 | Fill #0

## 2016-02-23 ENCOUNTER — Encounter: Payer: Self-pay | Admitting: Internal Medicine

## 2016-02-23 ENCOUNTER — Ambulatory Visit (INDEPENDENT_AMBULATORY_CARE_PROVIDER_SITE_OTHER): Payer: Medicare Other | Admitting: Internal Medicine

## 2016-02-23 VITALS — BP 113/58 | HR 86 | Temp 100.0°F | Resp 18 | Ht 62.0 in | Wt 172.0 lb

## 2016-02-23 DIAGNOSIS — D571 Sickle-cell disease without crisis: Secondary | ICD-10-CM

## 2016-02-23 MED ORDER — OXYCODONE HCL 15 MG PO TABS: 15.0000 mg | ORAL_TABLET | ORAL | 0 refills | Status: DC | PRN

## 2016-02-23 MED FILL — oxyCODONE HCL 15 MG TABS: 15 | 15 days supply | Qty: 90 | Fill #0

## 2016-02-23 NOTE — Progress Notes (Signed)
[name], is a 25 y.o. female  [id number]  [id number]  DOB - 1990/10/16  Chief Complaint  Patient presents with  . Follow-up       Subjective:   [name] is a 25 y.o. female with sickle cell disease here today for a follow up visit. She is here today with her two kids, she has no new complaints. She claims her new pain regimen is working well (MS Contin 30 mg Q12H and Oxycodone 15 mg Q4H PRN). She said she is adherent with her hydroxyurea and other home medications. She denies being depressed, she actually said she is out of the "blues". Patient has No headache, No chest pain, No abdominal pain - No Nausea, No new weakness tingling or numbness, No Cough - SOB.  No problems updated.  ALLERGIES: Allergies  Allergen Reactions  . Food Hives and Other (See Comments)    Pt states that she is allergic to carrots.   . Latex Rash    PAST MEDICAL HISTORY: Past Medical History:  Diagnosis Date  . Blood transfusion    "lots"  . Blood transfusion without reported diagnosis   . Chronic back pain    "very severe; have knot in my back; from tight muscle; take RX and exercise for it"  . Depression 01/06/2011  . Exertional dyspnea    "sometimes"  . Genital HSV   . GERD (gastroesophageal reflux disease) 02/17/2011  . Migraines 11/08/11   "@ least twice/month"  . 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  . Mood swings ([place]) 11/08/11   "I go back and forth; real bad"  . Sickle cell anemia ([name])   . Sickle cell anemia with crisis ([place])   . Trichotillomania    h/o    MEDICATIONS AT HOME: Prior to Admission medications   Medication Sig Start Date End Date Taking? Authorizing Provider  aspirin-acetaminophen-caffeine (EXCEDRIN MIGRAINE) [phone] MG tablet Take 2 tablets by mouth every 6 (six) hours as needed for headache.    Yes Historical Provider, MD  cyclobenzaprine (FLEXERIL) 10 MG tablet Take 1 tablet (10 mg total) by mouth 3 (three) times daily as  needed for muscle spasms. 02/09/16  Yes Tresa Garter, MD  hydroxyurea (HYDREA) 500 MG capsule Take 2 capsules (1,000 mg total) by mouth daily. May take with food to minimize GI side effects. 02/06/16  Yes [name], MD  ibuprofen (ADVIL,MOTRIN) 600 MG tablet Take 600 mg by mouth every 6 (six) hours as needed for mild pain or moderate pain.   Yes Historical Provider, MD  morphine (MS CONTIN) 30 MG 12 hr tablet Take 1 tablet (30 mg total) by mouth every 12 (twelve) hours. 02/17/16 03/03/16 Yes Erubiel Manasco Essie Christine, MD  oxyCODONE (ROXICODONE) 15 MG immediate release tablet Take 1 tablet (15 mg total) by mouth every 4 (four) hours as needed for pain. 02/24/16 03/10/16 Yes Tresa Garter, MD    Objective:   Vitals:   02/23/16 1108  BP: (!) 113/58  Pulse: 86  Resp: 18  Temp: 100 F (37.8 C)  TempSrc: Oral  SpO2: 100%  Weight: 172 lb (78 kg)  Height: 5\' 2"  (1.575 m)   Exam General appearance : Awake, alert, not in any distress. Speech Clear. Not toxic looking HEENT: Atraumatic and Normocephalic, pupils equally reactive to light and accomodation Neck: Supple, no JVD. No cervical lymphadenopathy.  Chest: Good air entry bilaterally, no added sounds  CVS: S1 S2 regular, no murmurs.  Abdomen: Bowel sounds present,  Non tender and not distended with no gaurding, rigidity or rebound. Extremities: B/L Lower Ext shows no edema, both legs are warm to touch Neurology: Awake alert, and oriented X 3, CN II-XII intact, Non focal Skin: No Rash  Data Review No results found for: HGBA1C  Assessment & Plan   1. Hb-SS disease without crisis ([place]) Refill - oxyCODONE (ROXICODONE) 15 MG immediate release tablet; Take 1 tablet (15 mg total) by mouth every 4 (four) hours as needed for pain.  Dispense: 90 tablet; Refill: 0  Continue Hydrea. We discussed the need for good hydration, monitoring of hydration status, avoidance of heat, cold, stress, and infection triggers. We discussed the  risks and benefits of Hydrea, including bone marrow suppression, the possibility of GI upset, skin ulcers, hair thinning, and teratogenicity. The patient was reminded of the need to seek medical attention of any symptoms of bleeding, anemia, or infection. Continue folic acid 1 mg daily to prevent aplastic bone marrow crises.   Pulmonary evaluation - Patient denies severe recurrent wheezes, shortness of breath with exercise, or persistent cough. If these symptoms develop, pulmonary function tests with spirometry will be ordered, and if abnormal, plan on referral to Pulmonology for further evaluation.  Immunization status - Yearly influenza vaccination is recommended, as well as being up to date with Meningococcal and Pneumococcal vaccines.   Acute and chronic painful episodes - We agreed on Opiate dose and amount of pills  per month. We discussed that pt is to receive Schedule II prescriptions only from our clinic. Pt is also aware that the prescription history is available to [place] online through the [facility] CSRS. Controlled substance agreement reviewed and signed. We reminded [name] that all patients receiving Schedule II narcotics must be seen for follow within one month of prescription being requested. We reviewed the terms of our pain agreement, including the need to keep medicines in a safe locked location away from children or pets, and the need to report excess sedation or constipation, measures to avoid constipation, and policies related to early refills and stolen prescriptions. According to the [place] [facility] program, we have reviewed details related to analgesia, adverse effects and aberrant behaviors.  Patient have been counseled extensively about nutrition and exercise.  Return in about 4 weeks (around 03/22/2016) for Routine Follow Up, Sickle Cell Disease/Pain.  The patient was given clear instructions to go to ER or return to medical center if symptoms don't improve, worsen or new  problems develop. The patient verbalized understanding. The patient was told to call to get lab results if they haven't heard anything in the next week.   This note has been created with Surveyor, quantity. Any transcriptional errors are unintentional.    Angelica Chessman, MD, [place], [place], [place], [place] and [place], [place]   02/23/2016, 11:49 AM

## 2016-02-23 NOTE — Progress Notes (Signed)
Patient is here for FU SCD  Patient denies pain at this time.  Patient has taken medication today. Patient has not eaten today.

## 2016-02-23 NOTE — Patient Instructions (Signed)
Sickle Cell Anemia, Adult Sickle cell anemia is a condition in which red blood cells have an abnormal "sickle" shape. This abnormal shape shortens the cells' life span, which results in a lower than normal concentration of red blood cells in the blood. The sickle shape also causes the cells to clump together and block free blood flow through the blood vessels. As a result, the tissues and organs of the body do not receive enough oxygen. Sickle cell anemia causes organ damage and pain and increases the risk of infection. CAUSES  Sickle cell anemia is a genetic disorder. Those who receive two copies of the gene have the condition, and those who receive one copy have the trait. RISK FACTORS The sickle cell gene is most common in people whose families originated in [place]. Other areas of the globe where sickle cell trait occurs include the Mediterranean, [place] and [place], the [place], and the [place].  SIGNS AND SYMPTOMS  Pain, especially in the extremities, back, chest, or abdomen (common). The pain may start suddenly or may develop following an illness, especially if there is dehydration. Pain can also occur due to overexertion or exposure to extreme temperature changes.  Frequent severe bacterial infections, especially certain types of pneumonia and meningitis.  Pain and swelling in the hands and feet.  Decreased activity.   Loss of appetite.   Change in behavior.  Headaches.  Seizures.  Shortness of breath or difficulty breathing.  Vision changes.  Skin ulcers. Those with the trait may not have symptoms or they may have mild symptoms.  DIAGNOSIS  Sickle cell anemia is diagnosed with blood tests that demonstrate the genetic trait. It is often diagnosed during the newborn period, due to mandatory testing nationwide. A variety of blood tests, X-rays, CT scans, MRI scans, ultrasounds, and lung function tests may also be done to monitor the condition. TREATMENT  Sickle  cell anemia may be treated with:  Medicines. You may be given pain medicines, antibiotic medicines (to treat and prevent infections) or medicines to increase the production of certain types of hemoglobin.  Fluids.  Oxygen.  Blood transfusions. HOME CARE INSTRUCTIONS   Drink enough fluid to keep your urine clear or pale yellow. Increase your fluid intake in hot weather and during exercise.  Do not smoke. Smoking lowers oxygen levels in the blood.   Only take over-the-counter or prescription medicines for pain, fever, or discomfort as directed by your health care provider.  Take antibiotics as directed by your health care provider. Make sure you finish them it even if you start to feel better.   Take supplements as directed by your health care provider.   Consider wearing a medical alert bracelet. This tells anyone caring for you in an emergency of your condition.   When traveling, keep your medical information, health care provider's names, and the medicines you take with you at all times.   If you develop a fever, do not take medicines to reduce the fever right away. This could cover up a problem that is developing. Notify your health care provider.  Keep all follow-up appointments with your health care provider. Sickle cell anemia requires regular medical care. SEEK MEDICAL CARE IF: You have a fever. SEEK IMMEDIATE MEDICAL CARE IF:   You feel dizzy or faint.   You have new abdominal pain, especially on the left side near the stomach area.   You develop a persistent, often uncomfortable and painful penile erection (priapism). If this is not treated immediately it   will lead to impotence.   You have numbness your arms or legs or you have a hard time moving them.   You have a hard time with speech.   You have a fever or persistent symptoms for more than 2-3 days.   You have a fever and your symptoms suddenly get worse.   You have signs or symptoms of infection.  These include:   Chills.   Abnormal tiredness (lethargy).   Irritability.   Poor eating.   Vomiting.   You develop pain that is not helped with medicine.   You develop shortness of breath.  You have pain in your chest.   You are coughing up pus-like or bloody sputum.   You develop a stiff neck.  Your feet or hands swell or have pain.  Your abdomen appears bloated.  You develop joint pain. MAKE SURE YOU:  Understand these instructions.   This information is not intended to replace advice given to you by your health care provider. Make sure you discuss any questions you have with your health care provider.   Document Released: 08/10/2005 Document Revised: 05/23/2014 Document Reviewed: 12/12/2012 Elsevier Interactive Patient Education 2016 [employer]  

## 2016-02-26 ENCOUNTER — Emergency Department (HOSPITAL_COMMUNITY): Payer: Medicare Other

## 2016-02-26 ENCOUNTER — Inpatient Hospital Stay (HOSPITAL_COMMUNITY)
Admission: EM | Admit: 2016-02-26 | Discharge: 2016-03-03 | DRG: 812 | Disposition: A | Discharge status: Home / Self Care | Payer: Medicare Other | Attending: Internal Medicine | Admitting: Internal Medicine

## 2016-02-26 ENCOUNTER — Encounter (HOSPITAL_COMMUNITY): Payer: Self-pay | Admitting: Emergency Medicine

## 2016-02-26 DIAGNOSIS — F1410 Cocaine abuse, uncomplicated: Secondary | ICD-10-CM | POA: Diagnosis not present

## 2016-02-26 DIAGNOSIS — W06XXXA Fall from bed, initial encounter: Secondary | ICD-10-CM | POA: Diagnosis not present

## 2016-02-26 DIAGNOSIS — Z79899 Other long term (current) drug therapy: Secondary | ICD-10-CM

## 2016-02-26 DIAGNOSIS — F339 Major depressive disorder, recurrent, unspecified: Secondary | ICD-10-CM

## 2016-02-26 DIAGNOSIS — X58XXXA Exposure to other specified factors, initial encounter: Secondary | ICD-10-CM | POA: Diagnosis present

## 2016-02-26 DIAGNOSIS — Z87891 Personal history of nicotine dependence: Secondary | ICD-10-CM | POA: Diagnosis not present

## 2016-02-26 DIAGNOSIS — N939 Abnormal uterine and vaginal bleeding, unspecified: Secondary | ICD-10-CM | POA: Diagnosis not present

## 2016-02-26 DIAGNOSIS — W19XXXA Unspecified fall, initial encounter: Secondary | ICD-10-CM | POA: Diagnosis not present

## 2016-02-26 DIAGNOSIS — T671XXA Heat syncope, initial encounter: Secondary | ICD-10-CM | POA: Diagnosis present

## 2016-02-26 DIAGNOSIS — D5700 Hb-SS disease with crisis, unspecified: Principal | ICD-10-CM | POA: Diagnosis present

## 2016-02-26 DIAGNOSIS — Y92239 Unspecified place in hospital as the place of occurrence of the external cause: Secondary | ICD-10-CM

## 2016-02-26 DIAGNOSIS — Y92230 Patient room in hospital as the place of occurrence of the external cause: Secondary | ICD-10-CM

## 2016-02-26 DIAGNOSIS — W010XXA Fall on same level from slipping, tripping and stumbling without subsequent striking against object, initial encounter: Secondary | ICD-10-CM | POA: Diagnosis present

## 2016-02-26 DIAGNOSIS — F329 Major depressive disorder, single episode, unspecified: Secondary | ICD-10-CM | POA: Diagnosis present

## 2016-02-26 DIAGNOSIS — R55 Syncope and collapse: Secondary | ICD-10-CM

## 2016-02-26 DIAGNOSIS — R109 Unspecified abdominal pain: Secondary | ICD-10-CM | POA: Diagnosis not present

## 2016-02-26 DIAGNOSIS — R112 Nausea with vomiting, unspecified: Secondary | ICD-10-CM

## 2016-02-26 DIAGNOSIS — D638 Anemia in other chronic diseases classified elsewhere: Secondary | ICD-10-CM | POA: Diagnosis present

## 2016-02-26 DIAGNOSIS — R45851 Suicidal ideations: Secondary | ICD-10-CM | POA: Diagnosis not present

## 2016-02-26 DIAGNOSIS — E860 Dehydration: Secondary | ICD-10-CM | POA: Diagnosis present

## 2016-02-26 DIAGNOSIS — D72829 Elevated white blood cell count, unspecified: Secondary | ICD-10-CM | POA: Diagnosis not present

## 2016-02-26 DIAGNOSIS — R1110 Vomiting, unspecified: Secondary | ICD-10-CM | POA: Diagnosis present

## 2016-02-26 DIAGNOSIS — R079 Chest pain, unspecified: Secondary | ICD-10-CM | POA: Diagnosis not present

## 2016-02-26 DIAGNOSIS — S31801A Laceration without foreign body of unspecified buttock, initial encounter: Secondary | ICD-10-CM | POA: Diagnosis present

## 2016-02-26 DIAGNOSIS — E876 Hypokalemia: Secondary | ICD-10-CM | POA: Diagnosis not present

## 2016-02-26 DIAGNOSIS — R918 Other nonspecific abnormal finding of lung field: Secondary | ICD-10-CM | POA: Diagnosis not present

## 2016-02-26 LAB — CBC WITH DIFFERENTIAL/PLATELET
BASOS ABS: 0 10*3/uL (ref 0.0–0.1)
Basophils Relative: 0 %
EOS ABS: 0 10*3/uL (ref 0.0–0.7)
Eosinophils Relative: 0 %
HCT: 20.3 % — ABNORMAL LOW (ref 36.0–46.0)
HEMOGLOBIN: 7.4 g/dL — AB (ref 12.0–15.0)
LYMPHS ABS: 1.7 10*3/uL (ref 0.7–4.0)
Lymphocytes Relative: 5 %
MCH: 29.7 pg (ref 26.0–34.0)
MCHC: 36.5 g/dL — AB (ref 30.0–36.0)
MCV: 81.5 fL (ref 78.0–100.0)
MONO ABS: 1.4 10*3/uL — AB (ref 0.1–1.0)
MONOS PCT: 4 %
NEUTROS ABS: 31.8 10*3/uL — AB (ref 1.7–7.7)
Neutrophils Relative %: 91 %
PLATELETS: 359 10*3/uL (ref 150–400)
RBC: 2.49 MIL/uL — AB (ref 3.87–5.11)
RDW: 18.6 % — AB (ref 11.5–15.5)
WBC: 34.9 10*3/uL — AB (ref 4.0–10.5)

## 2016-02-26 LAB — URINALYSIS, ROUTINE W REFLEX MICROSCOPIC
BILIRUBIN URINE: NEGATIVE
GLUCOSE, UA: NEGATIVE mg/dL
HGB URINE DIPSTICK: NEGATIVE
Ketones, ur: NEGATIVE mg/dL
Leukocytes, UA: NEGATIVE
Nitrite: NEGATIVE
PH: 6 (ref 5.0–8.0)
Protein, ur: NEGATIVE mg/dL
SPECIFIC GRAVITY, URINE: 1.016 (ref 1.005–1.030)

## 2016-02-26 LAB — COMPREHENSIVE METABOLIC PANEL
ALK PHOS: 49 U/L (ref 38–126)
ALT: 19 U/L (ref 14–54)
ANION GAP: 7 (ref 5–15)
AST: 51 U/L — AB (ref 15–41)
Albumin: 4.6 g/dL (ref 3.5–5.0)
BILIRUBIN TOTAL: 3.9 mg/dL — AB (ref 0.3–1.2)
BUN: 14 mg/dL (ref 6–20)
CALCIUM: 9.2 mg/dL (ref 8.9–10.3)
CO2: 25 mmol/L (ref 22–32)
Chloride: 108 mmol/L (ref 101–111)
Creatinine, Ser: 0.89 mg/dL (ref 0.44–1.00)
GFR calc Af Amer: >60 mL/min (ref >60)
GLUCOSE: 101 mg/dL — AB (ref 65–99)
POTASSIUM: 4 mmol/L (ref 3.5–5.1)
Sodium: 140 mmol/L (ref 135–145)
TOTAL PROTEIN: 7.6 g/dL (ref 6.5–8.1)

## 2016-02-26 LAB — RETICULOCYTES
RBC.: 2.48 MIL/uL — AB (ref 3.87–5.11)
RETIC CT PCT: 12.2 % — AB (ref 0.4–3.1)
Retic Count, Absolute: 302.6 10*3/uL — ABNORMAL HIGH (ref 19.0–186.0)

## 2016-02-26 LAB — LIPASE, BLOOD: Lipase: 25 U/L (ref 11–51)

## 2016-02-26 LAB — I-STAT BETA HCG BLOOD, ED (MC, WL, AP ONLY): I-stat hCG, quantitative: <5 m[IU]/mL (ref <5)

## 2016-02-26 LAB — TROPONIN I: Troponin I: <0.03 ng/mL (ref <0.03)

## 2016-02-26 MED ORDER — HYDROMORPHONE HCL 1 MG/ML IJ SOLN
1.0000 mg | INTRAMUSCULAR | Status: DC | PRN
  Administered 2016-02-26 (×2): 1 mg via INTRAVENOUS
  Filled 2016-02-26 (×3): qty 1

## 2016-02-26 MED ORDER — SODIUM CHLORIDE 0.9% FLUSH: 9.0000 mL | INTRAVENOUS | Status: DC | PRN

## 2016-02-26 MED ORDER — ENOXAPARIN SODIUM 40 MG/0.4ML ~~LOC~~ SOLN
40.0000 mg | SUBCUTANEOUS | Status: DC
  Administered 2016-02-26 – 2016-02-28 (×2): 40 mg via SUBCUTANEOUS
  Filled 2016-02-26 (×5): qty 0.4

## 2016-02-26 MED ORDER — DIPHENHYDRAMINE HCL 25 MG PO CAPS
25.0000 mg | ORAL_CAPSULE | ORAL | Status: DC | PRN
  Administered 2016-02-26 – 2016-02-28 (×4): 50 mg via ORAL
  Administered 2016-02-28: 25 mg via ORAL
  Filled 2016-02-26 (×5): qty 2

## 2016-02-26 MED ORDER — SODIUM CHLORIDE 0.9 % IV SOLN: 250.0000 mL | INTRAVENOUS | Status: DC | PRN

## 2016-02-26 MED ORDER — DIPHENHYDRAMINE HCL 50 MG/ML IJ SOLN
25.0000 mg | Freq: Once | INTRAMUSCULAR | Status: AC
  Administered 2016-02-26: 25 mg via INTRAVENOUS
  Filled 2016-02-26: qty 1

## 2016-02-26 MED ORDER — ONDANSETRON HCL 4 MG/2ML IJ SOLN
4.0000 mg | Freq: Once | INTRAMUSCULAR | Status: AC
  Administered 2016-02-26: 4 mg via INTRAVENOUS
  Filled 2016-02-26: qty 2

## 2016-02-26 MED ORDER — FAMOTIDINE 20 MG PO TABS
20.0000 mg | ORAL_TABLET | Freq: Two times a day (BID) | ORAL | Status: DC
  Administered 2016-02-26 – 2016-03-03 (×11): 20 mg via ORAL
  Filled 2016-02-26 (×12): qty 1

## 2016-02-26 MED ORDER — IOPAMIDOL (ISOVUE-370) INJECTION 76%
100.0000 mL | Freq: Once | INTRAVENOUS | Status: AC | PRN
  Administered 2016-02-26: 100 mL via INTRAVENOUS

## 2016-02-26 MED ORDER — SODIUM CHLORIDE 0.9% FLUSH: 3.0000 mL | INTRAVENOUS | Status: DC | PRN

## 2016-02-26 MED ORDER — KETOROLAC TROMETHAMINE 15 MG/ML IJ SOLN
15.0000 mg | Freq: Once | INTRAMUSCULAR | Status: AC
  Administered 2016-02-26: 15 mg via INTRAVENOUS
  Filled 2016-02-26: qty 1

## 2016-02-26 MED ORDER — POLYETHYLENE GLYCOL 3350 17 G PO PACK
17.0000 g | PACK | Freq: Every day | ORAL | Status: DC | PRN
  Administered 2016-03-01 – 2016-03-03 (×2): 17 g via ORAL
  Filled 2016-02-26 (×2): qty 1

## 2016-02-26 MED ORDER — HYDROXYUREA 500 MG PO CAPS
1000.0000 mg | ORAL_CAPSULE | Freq: Every day | ORAL | Status: DC
  Administered 2016-02-26 – 2016-02-28 (×3): 1000 mg via ORAL
  Filled 2016-02-26 (×3): qty 2

## 2016-02-26 MED ORDER — SENNOSIDES-DOCUSATE SODIUM 8.6-50 MG PO TABS
1.0000 | ORAL_TABLET | Freq: Two times a day (BID) | ORAL | Status: DC
  Administered 2016-02-26 – 2016-03-03 (×12): 1 via ORAL
  Filled 2016-02-26 (×12): qty 1

## 2016-02-26 MED ORDER — IOPAMIDOL (ISOVUE-300) INJECTION 61%
15.0000 mL | Freq: Once | INTRAVENOUS | Status: AC | PRN
  Administered 2016-02-26: 15 mL via INTRAVENOUS

## 2016-02-26 MED ORDER — KETOROLAC TROMETHAMINE 30 MG/ML IJ SOLN
30.0000 mg | Freq: Four times a day (QID) | INTRAMUSCULAR | Status: AC
  Administered 2016-02-26 – 2016-03-02 (×20): 30 mg via INTRAVENOUS
  Filled 2016-02-26 (×20): qty 1

## 2016-02-26 MED ORDER — HYDROMORPHONE HCL 2 MG/ML IJ SOLN
2.0000 mg | Freq: Once | INTRAMUSCULAR | Status: AC
  Administered 2016-02-26: 2 mg via INTRAVENOUS
  Filled 2016-02-26: qty 1

## 2016-02-26 MED ORDER — NALOXONE HCL 0.4 MG/ML IJ SOLN: 0.4000 mg | INTRAMUSCULAR | Status: DC | PRN

## 2016-02-26 MED ORDER — HYDROMORPHONE 1 MG/ML IV SOLN
INTRAVENOUS | Status: DC
  Administered 2016-02-26: 19:00:00 via INTRAVENOUS
  Administered 2016-02-27: 6.6 mg via INTRAVENOUS
  Administered 2016-02-27: 3 mg via INTRAVENOUS
  Administered 2016-02-27: 1.2 mg via INTRAVENOUS
  Administered 2016-02-27: 13:00:00 via INTRAVENOUS
  Administered 2016-02-27: 4 mg via INTRAVENOUS
  Administered 2016-02-27: 5.4 mg via INTRAVENOUS
  Administered 2016-02-27: 4 mg via INTRAVENOUS
  Administered 2016-02-27: 8 mg via INTRAVENOUS
  Administered 2016-02-28: 3 mg via INTRAVENOUS
  Administered 2016-02-28: 14:00:00 via INTRAVENOUS
  Administered 2016-02-28: 3.6 mg via INTRAVENOUS
  Administered 2016-02-28: 4.2 mg via INTRAVENOUS
  Administered 2016-02-28: 3.6 mg via INTRAVENOUS
  Administered 2016-02-28: 3 mg via INTRAVENOUS
  Administered 2016-02-29: 4.8 mg via INTRAVENOUS
  Administered 2016-02-29: 4.2 mg via INTRAVENOUS
  Administered 2016-02-29: 6 mg via INTRAVENOUS
  Administered 2016-02-29: 5.4 mg via INTRAVENOUS
  Administered 2016-02-29: 4.8 mg via INTRAVENOUS
  Administered 2016-02-29: 13:00:00 via INTRAVENOUS
  Administered 2016-02-29: 6 mg via INTRAVENOUS
  Administered 2016-03-01: 4.8 mg via INTRAVENOUS
  Administered 2016-03-01: 10:00:00 via INTRAVENOUS
  Administered 2016-03-01: 3.6 mg via INTRAVENOUS
  Administered 2016-03-01: 3 mg via INTRAVENOUS
  Filled 2016-02-26 (×5): qty 25

## 2016-02-26 MED ORDER — FAMOTIDINE IN NACL 20-0.9 MG/50ML-% IV SOLN
20.0000 mg | Freq: Two times a day (BID) | INTRAVENOUS | Status: DC
  Filled 2016-02-26 (×3): qty 50

## 2016-02-26 MED ORDER — DEXTROSE 5 % IV BOLUS
1000.0000 mL | Freq: Once | INTRAVENOUS | Status: AC
  Administered 2016-02-26: 1000 mL via INTRAVENOUS

## 2016-02-26 MED ORDER — HYDROMORPHONE HCL 2 MG/ML IJ SOLN: 2.0000 mg | Freq: Once | INTRAMUSCULAR | Status: DC

## 2016-02-26 MED ORDER — SODIUM CHLORIDE 0.9% FLUSH: 10.0000 mL | INTRAVENOUS | Status: DC | PRN

## 2016-02-26 MED ORDER — HYDROMORPHONE HCL 2 MG/ML IJ SOLN
2.0000 mg | INTRAMUSCULAR | Status: DC
  Administered 2016-02-26 – 2016-02-29 (×28): 2 mg via INTRAVENOUS
  Filled 2016-02-26 (×30): qty 1

## 2016-02-26 MED ORDER — MORPHINE SULFATE ER 30 MG PO TBCR
30.0000 mg | EXTENDED_RELEASE_TABLET | Freq: Two times a day (BID) | ORAL | Status: DC
  Administered 2016-02-26 – 2016-03-03 (×12): 30 mg via ORAL
  Filled 2016-02-26 (×12): qty 1

## 2016-02-26 MED ORDER — SODIUM CHLORIDE 0.9% FLUSH
3.0000 mL | Freq: Two times a day (BID) | INTRAVENOUS | Status: DC
  Administered 2016-02-29 – 2016-03-01 (×2): 3 mL via INTRAVENOUS

## 2016-02-26 MED ORDER — CYCLOBENZAPRINE HCL 10 MG PO TABS: 10.0000 mg | ORAL_TABLET | Freq: Three times a day (TID) | ORAL | Status: DC | PRN

## 2016-02-26 MED ORDER — SODIUM CHLORIDE 0.9 % IV BOLUS (SEPSIS): 1000.0000 mL | Freq: Once | INTRAVENOUS | Status: DC

## 2016-02-26 MED ORDER — SODIUM CHLORIDE 0.9 % IV SOLN
25.0000 mg | INTRAVENOUS | Status: DC | PRN
  Filled 2016-02-26: qty 0.5

## 2016-02-26 MED ORDER — ONDANSETRON HCL 4 MG/2ML IJ SOLN
4.0000 mg | Freq: Four times a day (QID) | INTRAMUSCULAR | Status: DC | PRN
  Administered 2016-02-26: 4 mg via INTRAVENOUS
  Filled 2016-02-26: qty 2

## 2016-02-26 MED ORDER — DEXTROSE-NACL 5-0.45 % IV SOLN
INTRAVENOUS | Status: DC
  Administered 2016-02-26 – 2016-03-02 (×10): via INTRAVENOUS

## 2016-02-26 NOTE — H&P (Signed)
Hospital Admission Note Date: 02/26/2016  Patient name: HOPELYNN FUKUHARA Medical record number: [id number] Date of birth: [birth year] Age: 25 y.o. Gender: female PCP: Angelica Chessman, MD  Attending physician: [name], MD  Chief Complaint: Pain in back and legs x 3 days. Emesis x 1 day.  History of Present Illness:Lainie is an opiate tolerant patient with Hb SS who reports pain in back and legs typical of her sickle cell crisis which has been present for the last few days since the change in weather. She tried to manage her pain with oral analgesics and was doing well until yesterday when she started having emesis and was unable to keep down her medications. She continued to vomit until her arrival to the ED and this improved after she received anti-emetics and IVF. Her pain escalated to 10/10 when she started having emesis and she reports that she has been unable to get any sleep in the last 24 hours. She also reports an episode of syncope while folding clothes for her kids. She denies dizziness or palpitations prior to syncopal episode but does report being "hot and sweaty". Her emesis  Started after the syncopal episode. She states that her pain is presently at 10 /10 and the dose of medication received in the ED has not affected her pain. She denies any other associated symptoms: fever, chills, cough, diarrhea.  In the ED she received 2 doses of Dilaudid 1 mg  and dose of Toradol and her pain is still 10/10. I am asked to admit her for sickle cell Crisis and syncope.   Scheduled Meds:  Continuous Infusions:  PRN Meds:.HYDROmorphone (DILAUDID) injection Allergies: Food and Latex Past Medical History:  Diagnosis Date  . Blood transfusion    "lots"  . Blood transfusion without reported diagnosis   . Chronic back pain    "very severe; have knot in my back; from tight muscle; take RX and exercise for it"  . Depression 01/06/2011  . Exertional dyspnea    "sometimes"  .  Genital HSV   . GERD (gastroesophageal reflux disease) 02/17/2011  . Migraines 11/08/11   "@ least twice/month"  . 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  . Mood swings ([place]) 11/08/11   "I go back and forth; real bad"  . Sickle cell anemia ([name])   . Sickle cell anemia with crisis ([place])   . Trichotillomania    h/o   Past Surgical History:  Procedure Laterality Date  . CHOLECYSTECTOMY  05/2010  . DILATION AND CURETTAGE OF UTERUS  02/20/11   S/P miscarriage  . IR GENERIC HISTORICAL  12/23/2015   IR FLUORO GUIDE CV LINE RIGHT 12/23/2015 Jacqulynn Cadet, MD [facility]  . IR GENERIC HISTORICAL  12/23/2015   IR US GUIDE VASC ACCESS RIGHT 12/23/2015 Jacqulynn Cadet, MD [facility]   Family History  Problem Relation Age of Onset  . Sickle cell trait Mother   . Sickle cell trait Father   . Diabetes Maternal Grandmother   . Diabetes Paternal Grandmother   . Hypertension Paternal Grandmother   . Diabetes Maternal Grandfather    Social History   Social History  . Marital status: Single    Spouse name: N/A  . Number of children: N/A  . Years of education: N/A   Occupational History  . Not on file.   Social History Main Topics  . Smoking status: Former Smoker    Packs/day: 0.25    Years: 1.00    Types: Cigarettes  Quit date: 03/25/2013  . Smokeless tobacco: Never Used  . Alcohol use No     Comment: pt states she quit marijuan in May 2013. Rare ETOH, + cigarettes.  She is enrolled in school  . 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 Sexual activity: Yes    Birth control/ protection: None     Comment: last sex Jul 11 2015   Other Topics Concern  . 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 Physical Exam: No intake or output  data in the 24 hours ending 02/26/16 1632 General: Alert, awake, oriented x3, in moderate distress.  HEENT: [place]/AT PEERL, EOMI, anicteric Neck: Trachea midline,  no masses, no thyromegal,y no JVD, no carotid bruit OROPHARYNX:  Moist, No exudate/ erythema/lesions.  Heart: Regular rate and rhythm, without murmurs, rubs, gallops, PMI non-displaced, no heaves or thrills on palpation.  Lungs: Clear to auscultation, no wheezing or rhonchi noted. No increased vocal fremitus resonant to percussion  Abdomen: Soft, nontender, nondistended, positive bowel sounds, no masses no hepatosplenomegaly noted..  Neuro: No focal neurological deficits noted cranial nerves II through XII grossly intact.  Strength at functional baseline in bilateral upper and lower extremities. Musculoskeletal: No warmth swelling or erythema around joints, no spinal tenderness noted. Psychiatric: Patient alert and oriented x3, good insight and cognition, good recent to remote recall. Lymph node survey: No cervical axillary or inguinal lymphadenopathy noted.  Lab results:  Recent Labs  02/26/16 1052  NA 140  K 4.0  CL 108  CO2 25  GLUCOSE 101*  BUN 14  CREATININE 0.89  CALCIUM 9.2    Recent Labs  02/26/16 1052  AST 51*  ALT 19  ALKPHOS 49  BILITOT 3.9*  PROT 7.6  ALBUMIN 4.6    Recent Labs  02/26/16 1052  LIPASE 25    Recent Labs  02/26/16 1052  WBC 34.9*  NEUTROABS 31.8*  HGB 7.4*  HCT 20.3*  MCV 81.5  PLT 359   No results for input(s): CKTOTAL, CKMB, CKMBINDEX, TROPONINI in the last 72 hours. Invalid input(s): POCBNP No results for input(s): DDIMER in the last 72 hours. No results for input(s): HGBA1C in the last 72 hours. No results for input(s): CHOL, HDL, LDLCALC, TRIG, CHOLHDL, LDLDIRECT in the last 72 hours. No results for input(s): TSH, T4TOTAL, T3FREE, THYROIDAB in the last 72 hours.  Invalid input(s): FREET3  Recent Labs  02/26/16 1052  RETICCTPCT 12.2*   Imaging results:  Dg  Chest 2 View  Result Date: 02/26/2016 CLINICAL DATA:  Chest pain, sickle cell crisis. EXAM: CHEST  2 VIEW COMPARISON:  Radiographs of January 03, 2016. FINDINGS: The heart size and mediastinal contours are within normal limits. Both lungs are clear. No pneumothorax or pleural effusion is noted. Right internal jugular Port-A-Cath is unchanged in position. Skeletal changes are noted consistent with history of sickle cell disease. IMPRESSION: No active cardiopulmonary disease. Electronically Signed   By: Marijo Conception, M.D.   On: 02/26/2016 10:31   Ct Angio Chest Pe W And/or Wo Contrast  Result Date: 02/26/2016 CLINICAL DATA:  25 year old with sickle cell anemia. Patient complains of back and flank pain. EXAM: CT ANGIOGRAPHY CHEST CT ABDOMEN AND PELVIS WITH CONTRAST TECHNIQUE:  Multidetector CT imaging of the chest was performed using the standard protocol during bolus administration of intravenous contrast. Multiplanar CT image reconstructions and MIPs were obtained to evaluate the vascular anatomy. Multidetector CT imaging of the abdomen and pelvis was performed using the standard protocol during bolus administration of intravenous contrast. CONTRAST:  130 mL Isovue 370 There was an issue with contrast leaking from the Port-A-Cath. As a result, contrast was given on 2 occasions. COMPARISON:  07/18/2012 and 11/30/2011 FINDINGS: CTA CHEST FINDINGS Cardiovascular: Study is not adequate to evaluate for pulmonary embolism. There is motion artifact and mixing of contrast and blood in the lobar and distal pulmonary arteries. Normal caliber of the thoracic aorta. Incidentally, the left vertebral artery originates off of the arch which is a normal variant. Right thyroid lobe appears to mildly enlarged but poorly characterized. Central line tip extends into the right atrium region. Mediastinum/Nodes: Small amount of anterior mediastinal tissue is suggestive for residual thymus. Small lymph nodes in the prevascular  space. Overall, there is not significant chest lymphadenopathy. No significant axillary lymphadenopathy. Lungs/Pleura: Trachea and mainstem bronchi are patent. Stable pleural-based nodule in the right upper lobe on sequence 17, image 16. Subtle nodular density in the right lower lobe on image 41 is essentially unchanged. There is a bilobed nodular structure in the superior segment of left lower lobe that measures roughly 6 mm and stable. This is seen on sequence 17, image 42. No large areas of airspace disease or consolidation. Musculoskeletal: Again noted is marked sclerosis in the left humeral head which could be related to avascular necrosis. Review of the MIP images confirms the above findings. CT ABDOMEN and PELVIS FINDINGS Hepatobiliary: Gallbladder has been removed. Normal appearance of the liver without significant biliary dilatation. Portal venous system is patent. Pancreas: Normal appearance of the pancreas without inflammation or duct dilatation. Spleen: The spleen has diffuse nodularity and similar to the previous examination. Similar finding was noted on a CT from 11/30/2011. No significant splenic enlargement. Adrenals/Urinary Tract: Normal adrenal glands. Normal appearance of both kidneys without hydronephrosis. Urinary bladder is unremarkable. Limited evaluation for renal calculi because there is contrast in the renal collecting systems. Stomach/Bowel: Stomach is within normal limits. Appendix appears normal. No evidence of bowel wall thickening, distention, or inflammatory changes. Vascular/Lymphatic: No significant vascular findings are present. No enlarged abdominal or pelvic lymph nodes. Reproductive: Uterus and bilateral adnexa are unremarkable. Other: No free fluid. No free air. Abdominal wall laxity near the umbilicus. There is a tiny umbilical hernia containing fat. Musculoskeletal: Changes in the vertebral body endplates compatible with sickle cell disease. Partially visualized lucency in  the proximal right femur is incompletely evaluated. Patchy sclerosis in both femoral heads suggestive for AVN. Review of the MIP images confirms the above findings. IMPRESSION: Suboptimal evaluation for pulmonary emboli due to poor opacification of the pulmonary arteries as described. Pulmonary emboli cannot be excluded on this examination. No acute chest abnormality. Stable small pulmonary nodules that are probably postinflammatory. No acute abnormality in the abdomen or pelvis. Stable nodularity throughout the spleen. This nodularity has been present since 11/30/2011 and suggestive for a benign etiology. This is most likely associated with the sickle cell anemia. Diffuse bone changes compatible with sickle cell anemia. 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 Electronically Signed   By: Markus Daft M.D.   On: 02/26/2016 14:17   Ct Abdomen Pelvis W Contrast  Result Date: 02/26/2016 CLINICAL DATA:  25 year old with sickle cell anemia.  Patient complains of back and flank pain. EXAM: CT ANGIOGRAPHY CHEST CT ABDOMEN AND PELVIS WITH CONTRAST TECHNIQUE: Multidetector CT imaging of the chest was performed using the standard protocol during bolus administration of intravenous contrast. Multiplanar CT image reconstructions and MIPs were obtained to evaluate the vascular anatomy. Multidetector CT imaging of the abdomen and pelvis was performed using the standard protocol during bolus administration of intravenous contrast. CONTRAST:  130 mL Isovue 370 There was an issue with contrast leaking from the Port-A-Cath. As a result, contrast was given on 2 occasions. COMPARISON:  07/18/2012 and 11/30/2011 FINDINGS: CTA CHEST FINDINGS Cardiovascular: Study is not adequate to evaluate for pulmonary embolism. There is motion artifact and mixing of contrast and blood in the lobar and distal pulmonary arteries. Normal caliber of the thoracic aorta. Incidentally, the left vertebral artery  originates off of the arch which is a normal variant. Right thyroid lobe appears to mildly enlarged but poorly characterized. Central line tip extends into the right atrium region. Mediastinum/Nodes: Small amount of anterior mediastinal tissue is suggestive for residual thymus. Small lymph nodes in the prevascular space. Overall, there is not significant chest lymphadenopathy. No significant axillary lymphadenopathy. Lungs/Pleura: Trachea and mainstem bronchi are patent. Stable pleural-based nodule in the right upper lobe on sequence 17, image 16. Subtle nodular density in the right lower lobe on image 41 is essentially unchanged. There is a bilobed nodular structure in the superior segment of left lower lobe that measures roughly 6 mm and stable. This is seen on sequence 17, image 42. No large areas of airspace disease or consolidation. Musculoskeletal: Again noted is marked sclerosis in the left humeral head which could be related to avascular necrosis. Review of the MIP images confirms the above findings. CT ABDOMEN and PELVIS FINDINGS Hepatobiliary: Gallbladder has been removed. Normal appearance of the liver without significant biliary dilatation. Portal venous system is patent. Pancreas: Normal appearance of the pancreas without inflammation or duct dilatation. Spleen: The spleen has diffuse nodularity and similar to the previous examination. Similar finding was noted on a CT from 11/30/2011. No significant splenic enlargement. Adrenals/Urinary Tract: Normal adrenal glands. Normal appearance of both kidneys without hydronephrosis. Urinary bladder is unremarkable. Limited evaluation for renal calculi because there is contrast in the renal collecting systems. Stomach/Bowel: Stomach is within normal limits. Appendix appears normal. No evidence of bowel wall thickening, distention, or inflammatory changes. Vascular/Lymphatic: No significant vascular findings are present. No enlarged abdominal or pelvic lymph  nodes. Reproductive: Uterus and bilateral adnexa are unremarkable. Other: No free fluid. No free air. Abdominal wall laxity near the umbilicus. There is a tiny umbilical hernia containing fat. Musculoskeletal: Changes in the vertebral body endplates compatible with sickle cell disease. Partially visualized lucency in the proximal right femur is incompletely evaluated. Patchy sclerosis in both femoral heads suggestive for AVN. Review of the MIP images confirms the above findings. IMPRESSION: Suboptimal evaluation for pulmonary emboli due to poor opacification of the pulmonary arteries as described. Pulmonary emboli cannot be excluded on this examination. No acute chest abnormality. Stable small pulmonary nodules that are probably postinflammatory. No acute abnormality in the abdomen or pelvis. Stable nodularity throughout the spleen. This nodularity has been present since 11/30/2011 and suggestive for a benign etiology. This is most likely associated with the sickle cell anemia. Diffuse bone changes compatible with sickle cell anemia. 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 Electronically Signed   By: Markus Daft M.D.   On: 02/26/2016 14:17  EKG: Non-specific T-wave changes.  Assessment and Plan: 1. Syncope: From the description it appears that she may have "fainted" likely due to elevated ambient temperatures combined with a state of dehydration. However she does have risk associated with sickle cell disease. So will obtain cardiac enzymes and monitor on telemetry.  2. Hb SS with Crisis: Will treat with Dilaudid PCA, Clinician assisted doses and Toradol. Continue IVF and re-evaluate pain tomorrow.  3. Leukocytosis: Marked leukocytosis associated with crisis and dehydrated state. No evidence of infection. Expect to see some improvement in WBC with hydration and  treatment of crisis.  4. Dehydration: Pt still mildly dehydrated. Continue IVF. 5. Emesis: She is  currently eating a sandwich. Will continue to give trial of diet.  6. Anemia of Chronic Disease: Hb at baseline. Continue to monitor. 7. DVT Prophylaxis: Lovenox.  Time spent during this visit was 65 mionutes.   MATTHEWS,MICHELLE A. 02/26/2016, 4:32 PM

## 2016-02-26 NOTE — ED Provider Notes (Signed)
[facility] DEPT Provider Note   CSN: [id number] Arrival date & time: 02/26/16  [id number]     History   Chief Complaint Chief Complaint  Patient presents with  . Sickle Cell Pain Crisis    HPI [name] is a 25 y.o. female.   Sickle Cell Pain Crisis  Location:  Back, L side and R side Severity:  Severe Onset quality:  Gradual Duration:  12 hours Similar to previous crisis episodes: yes   Timing:  Constant Progression:  Worsening Sickle cell genotype:  SS Usual hemoglobin level:  8 Date of last transfusion:  June History of pulmonary emboli: no   Context: dehydration   Context: not alcohol consumption, not change in medication, not infection and not pregnancy   Relieved by:  Nothing Associated symptoms: chest pain (with emesis only) and nausea   Associated symptoms: no congestion, no cough, no fever, no headaches, no shortness of breath, no vomiting and no wheezing   Risk factors: cholecystectomy   Risk factors: no prior acute chest   No prior history of pulmonary embolisms.   Patient also reports syncopal episode last night after taking her oxycodone. Patient does not remember the episode however reports that her roommate found her on the floor. She had no preceding prodromal symptoms. Patient reports that this is similar to prior episodes in the past.  Past Medical History:  Diagnosis Date  . Blood transfusion    "lots"  . Blood transfusion without reported diagnosis   . Chronic back pain    "very severe; have knot in my back; from tight muscle; take RX and exercise for it"  . Depression 01/06/2011  . Exertional dyspnea    "sometimes"  . Genital HSV   . GERD (gastroesophageal reflux disease) 02/17/2011  . Migraines 11/08/11   "@ least twice/month"  . 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  . Mood swings ([place]) 11/08/11   "I go back and forth; real bad"  . Sickle cell anemia ([name])   . Sickle cell anemia with crisis ([employer])   .  Trichotillomania    h/o    Patient Active Problem List   Diagnosis Date Noted  . Sickle cell anemia with pain ([place]) 02/16/2016  . Chronic back pain   . Sickle cell anemia with crisis ([place]) 01/05/2016  . Nausea & vomiting 12/27/2015  . Sickle cell crisis ([place]) 11/09/2015  . Sepsis ([place]) 11/09/2015  . Thrombocytosis ([place]) 11/09/2015  . Vaginal bleeding 11/09/2015  . Hyperbilirubinemia 11/09/2015  . Pyrexia   . Vaginal delivery 10/31/2015  . Sickle cell pain crisis ([place]) 10/26/2015  . Positive GBS test 10/18/2015  . Narcotic dependence ([place]) 10/06/2015  . 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 High risk for intrapartum complications, antepartum 08/12/2015  . Chronic pain 08/04/2015  . Cocaine use 07/24/2015  . Maternal sickle cell anemia affecting pregnancy, antepartum ([place]) 07/14/2015  . Anemia of chronic disease   . Hypokalemia 10/29/2014  . 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 Migraines 11/08/2011  . GERD (gastroesophageal reflux disease) 02/17/2011  . Depression 01/06/2011  . Sickle cell disease ([place]) 01/08/2009  . 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 CHOLECYSTECTOMY  05/2010  . DILATION AND CURETTAGE OF UTERUS  02/20/11   S/P miscarriage  . IR GENERIC HISTORICAL  12/23/2015   IR FLUORO GUIDE CV LINE RIGHT 12/23/2015 Jacqulynn Cadet, MD [facility]-INTER[facility]  RAD  . IR GENERIC HISTORICAL  12/23/2015   IR US GUIDE VASC ACCESS RIGHT 12/23/2015 Jacqulynn Cadet, MD [facility] RAD    OB History    Gravida Para Term Preterm AB Living   3 2 2   1 2    SAB TAB Ectopic Multiple Live Births   1     0 2      Obstetric Comments   Miscarried in October 2012 at about 7 weeks       Home Medications    Prior to Admission medications   Medication Sig Start Date End Date Taking? Authorizing Provider  aspirin-acetaminophen-caffeine  (EXCEDRIN MIGRAINE) [phone] MG tablet Take 2 tablets by mouth every 6 (six) hours as needed for headache.    Yes Historical Provider, MD  cyclobenzaprine (FLEXERIL) 10 MG tablet Take 1 tablet (10 mg total) by mouth 3 (three) times daily as needed for muscle spasms. 02/09/16  Yes Tresa Garter, MD  hydroxyurea (HYDREA) 500 MG capsule Take 2 capsules (1,000 mg total) by mouth daily. May take with food to minimize GI side effects. 02/06/16  Yes [name], MD  ibuprofen (ADVIL,MOTRIN) 600 MG tablet Take 600 mg by mouth every 6 (six) hours as needed for mild pain or moderate pain.   Yes Historical Provider, MD  morphine (MS CONTIN) 30 MG 12 hr tablet Take 1 tablet (30 mg total) by mouth every 12 (twelve) hours. 02/17/16 03/03/16 Yes Olugbemiga Essie Christine, MD  oxyCODONE (ROXICODONE) 15 MG immediate release tablet Take 1 tablet (15 mg total) by mouth every 4 (four) hours as needed for pain. 02/24/16 03/10/16 Yes Tresa Garter, MD    Family History Family History  Problem Relation Age of Onset  . Sickle cell trait Mother   . Sickle cell trait Father   . Diabetes Maternal Grandmother   . Diabetes Paternal Grandmother   . Hypertension Paternal Grandmother   . Diabetes Maternal Grandfather     Social History Social History  Substance Use Topics  . Smoking status: Former Smoker    Packs/day: 0.25    Years: 1.00    Types: Cigarettes    Quit date: 03/25/2013  . Smokeless tobacco: Never Used  . Alcohol use No     Comment: pt states she quit marijuan in May 2013. Rare ETOH, + cigarettes.  She is enrolled in school     Allergies   Food and Latex   Review of Systems Review of Systems  Constitutional: Negative for fever.  HENT: Negative for congestion.   Respiratory: Negative for cough, shortness of breath and wheezing.   Cardiovascular: Positive for chest pain (with emesis only).  Gastrointestinal: Positive for nausea. Negative for vomiting.  Neurological: Negative for  headaches.  All other systems reviewed and are negative.    Physical Exam Updated Vital Signs BP 120/66   Pulse 94   Temp 98.2 F (36.8 C) (Oral)   Resp 18   LMP 12/26/2015   SpO2 91%   Physical Exam  Constitutional: She is oriented to person, place, and time. She appears well-developed and well-nourished. No distress.  HENT:  Head: Normocephalic and atraumatic.  Nose: Nose normal.  Eyes: Conjunctivae and EOM are normal. Pupils are equal, round, and reactive to light. Right eye exhibits no discharge. Left eye exhibits no discharge. No scleral icterus.  Neck: Normal range of motion. Neck supple.  Cardiovascular: Normal rate and regular rhythm.  Exam reveals no gallop and no friction rub.   No murmur heard. Pulmonary/Chest:  Effort normal and breath sounds normal. No stridor. No respiratory distress. She has no rales.  Abdominal: Soft. She exhibits no distension. There is generalized tenderness. There is CVA tenderness. There is no rigidity, no rebound, no guarding, no tenderness at McBurney's point and negative Murphy's sign.  Musculoskeletal: She exhibits no edema or tenderness.       Arms: Neurological: She is alert and oriented to person, place, and time.  Skin: Skin is warm and dry. No rash noted. She is not diaphoretic. No erythema.  Psychiatric: She has a normal mood and affect.  Vitals reviewed.    ED Treatments / Results  Labs (all labs ordered are listed, but only abnormal results are displayed) Labs Reviewed  COMPREHENSIVE METABOLIC PANEL - Abnormal; Notable for the following:       Result Value   Glucose, Bld 101 (*)    AST 51 (*)    Total Bilirubin 3.9 (*)    All other components within normal limits  CBC WITH DIFFERENTIAL/PLATELET - Abnormal; Notable for the following:    WBC 34.9 (*)    RBC 2.49 (*)    Hemoglobin 7.4 (*)    HCT 20.3 (*)    MCHC 36.5 (*)    RDW 18.6 (*)    Neutro Abs 31.8 (*)    Monocytes Absolute 1.4 (*)    All other components within  normal limits  RETICULOCYTES - Abnormal; Notable for the following:    Retic Ct Pct 12.2 (*)    RBC. 2.48 (*)    Retic Count, Manual 302.6 (*)    All other components within normal limits  LIPASE, BLOOD  URINALYSIS, ROUTINE W REFLEX MICROSCOPIC (NOT AT [facility])  I-STAT BETA HCG BLOOD, ED ([facility], [facility], [facility] ONLY)    EKG  EKG Interpretation  Date/Time:  Friday February 26 2016 10:02:45 EDT Ventricular Rate:  105 PR Interval:    QRS Duration: 89 QT Interval:  290 QTC Calculation: 384 R Axis:   66 Text Interpretation:  Sinus tachycardia Borderline T abnormalities, anterior leads No significant change since last tracing Confirmed by [facility] MD, Dandre Sisler ([id number]) on 02/26/2016 11:29:05 AM       Radiology Dg Chest 2 View  Result Date: 02/26/2016 CLINICAL DATA:  Chest pain, sickle cell crisis. EXAM: CHEST  2 VIEW COMPARISON:  Radiographs of January 03, 2016. FINDINGS: The heart size and mediastinal contours are within normal limits. Both lungs are clear. No pneumothorax or pleural effusion is noted. Right internal jugular Port-A-Cath is unchanged in position. Skeletal changes are noted consistent with history of sickle cell disease. IMPRESSION: No active cardiopulmonary disease. Electronically Signed   By: Marijo Conception, M.D.   On: 02/26/2016 10:31   Ct Angio Chest Pe W And/or Wo Contrast  Result Date: 02/26/2016 CLINICAL DATA:  25 year old with sickle cell anemia. Patient complains of back and flank pain. EXAM: CT ANGIOGRAPHY CHEST CT ABDOMEN AND PELVIS WITH CONTRAST TECHNIQUE: Multidetector CT imaging of the chest was performed using the standard protocol during bolus administration of intravenous contrast. Multiplanar CT image reconstructions and MIPs were obtained to evaluate the vascular anatomy. Multidetector CT imaging of the abdomen and pelvis was performed using the standard protocol during bolus administration of intravenous contrast. CONTRAST:  130 mL Isovue 370 There was an issue with  contrast leaking from the Port-A-Cath. As a result, contrast was given on 2 occasions. COMPARISON:  07/18/2012 and 11/30/2011 FINDINGS: CTA CHEST FINDINGS Cardiovascular: Study is not adequate to evaluate for pulmonary embolism. There is  motion artifact and mixing of contrast and blood in the lobar and distal pulmonary arteries. Normal caliber of the thoracic aorta. Incidentally, the left vertebral artery originates off of the arch which is a normal variant. Right thyroid lobe appears to mildly enlarged but poorly characterized. Central line tip extends into the right atrium region. Mediastinum/Nodes: Small amount of anterior mediastinal tissue is suggestive for residual thymus. Small lymph nodes in the prevascular space. Overall, there is not significant chest lymphadenopathy. No significant axillary lymphadenopathy. Lungs/Pleura: Trachea and mainstem bronchi are patent. Stable pleural-based nodule in the right upper lobe on sequence 17, image 16. Subtle nodular density in the right lower lobe on image 41 is essentially unchanged. There is a bilobed nodular structure in the superior segment of left lower lobe that measures roughly 6 mm and stable. This is seen on sequence 17, image 42. No large areas of airspace disease or consolidation. Musculoskeletal: Again noted is marked sclerosis in the left humeral head which could be related to avascular necrosis. Review of the MIP images confirms the above findings. CT ABDOMEN and PELVIS FINDINGS Hepatobiliary: Gallbladder has been removed. Normal appearance of the liver without significant biliary dilatation. Portal venous system is patent. Pancreas: Normal appearance of the pancreas without inflammation or duct dilatation. Spleen: The spleen has diffuse nodularity and similar to the previous examination. Similar finding was noted on a CT from 11/30/2011. No significant splenic enlargement. Adrenals/Urinary Tract: Normal adrenal glands. Normal appearance of both kidneys  without hydronephrosis. Urinary bladder is unremarkable. Limited evaluation for renal calculi because there is contrast in the renal collecting systems. Stomach/Bowel: Stomach is within normal limits. Appendix appears normal. No evidence of bowel wall thickening, distention, or inflammatory changes. Vascular/Lymphatic: No significant vascular findings are present. No enlarged abdominal or pelvic lymph nodes. Reproductive: Uterus and bilateral adnexa are unremarkable. Other: No free fluid. No free air. Abdominal wall laxity near the umbilicus. There is a tiny umbilical hernia containing fat. Musculoskeletal: Changes in the vertebral body endplates compatible with sickle cell disease. Partially visualized lucency in the proximal right femur is incompletely evaluated. Patchy sclerosis in both femoral heads suggestive for AVN. Review of the MIP images confirms the above findings. IMPRESSION: Suboptimal evaluation for pulmonary emboli due to poor opacification of the pulmonary arteries as described. Pulmonary emboli cannot be excluded on this examination. No acute chest abnormality. Stable small pulmonary nodules that are probably postinflammatory. No acute abnormality in the abdomen or pelvis. Stable nodularity throughout the spleen. This nodularity has been present since 11/30/2011 and suggestive for a benign etiology. This is most likely associated with the sickle cell anemia. Diffuse bone changes compatible with sickle cell anemia. 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 Electronically Signed   By: Markus Daft M.D.   On: 02/26/2016 14:17   Ct Abdomen Pelvis W Contrast  Result Date: 02/26/2016 CLINICAL DATA:  25 year old with sickle cell anemia. Patient complains of back and flank pain. EXAM: CT ANGIOGRAPHY CHEST CT ABDOMEN AND PELVIS WITH CONTRAST TECHNIQUE: Multidetector CT imaging of the chest was performed using the standard protocol during bolus administration of  intravenous contrast. Multiplanar CT image reconstructions and MIPs were obtained to evaluate the vascular anatomy. Multidetector CT imaging of the abdomen and pelvis was performed using the standard protocol during bolus administration of intravenous contrast. CONTRAST:  130 mL Isovue 370 There was an issue with contrast leaking from the Port-A-Cath. As a result, contrast was given on 2 occasions. COMPARISON:  07/18/2012  and 11/30/2011 FINDINGS: CTA CHEST FINDINGS Cardiovascular: Study is not adequate to evaluate for pulmonary embolism. There is motion artifact and mixing of contrast and blood in the lobar and distal pulmonary arteries. Normal caliber of the thoracic aorta. Incidentally, the left vertebral artery originates off of the arch which is a normal variant. Right thyroid lobe appears to mildly enlarged but poorly characterized. Central line tip extends into the right atrium region. Mediastinum/Nodes: Small amount of anterior mediastinal tissue is suggestive for residual thymus. Small lymph nodes in the prevascular space. Overall, there is not significant chest lymphadenopathy. No significant axillary lymphadenopathy. Lungs/Pleura: Trachea and mainstem bronchi are patent. Stable pleural-based nodule in the right upper lobe on sequence 17, image 16. Subtle nodular density in the right lower lobe on image 41 is essentially unchanged. There is a bilobed nodular structure in the superior segment of left lower lobe that measures roughly 6 mm and stable. This is seen on sequence 17, image 42. No large areas of airspace disease or consolidation. Musculoskeletal: Again noted is marked sclerosis in the left humeral head which could be related to avascular necrosis. Review of the MIP images confirms the above findings. CT ABDOMEN and PELVIS FINDINGS Hepatobiliary: Gallbladder has been removed. Normal appearance of the liver without significant biliary dilatation. Portal venous system is patent. Pancreas: Normal  appearance of the pancreas without inflammation or duct dilatation. Spleen: The spleen has diffuse nodularity and similar to the previous examination. Similar finding was noted on a CT from 11/30/2011. No significant splenic enlargement. Adrenals/Urinary Tract: Normal adrenal glands. Normal appearance of both kidneys without hydronephrosis. Urinary bladder is unremarkable. Limited evaluation for renal calculi because there is contrast in the renal collecting systems. Stomach/Bowel: Stomach is within normal limits. Appendix appears normal. No evidence of bowel wall thickening, distention, or inflammatory changes. Vascular/Lymphatic: No significant vascular findings are present. No enlarged abdominal or pelvic lymph nodes. Reproductive: Uterus and bilateral adnexa are unremarkable. Other: No free fluid. No free air. Abdominal wall laxity near the umbilicus. There is a tiny umbilical hernia containing fat. Musculoskeletal: Changes in the vertebral body endplates compatible with sickle cell disease. Partially visualized lucency in the proximal right femur is incompletely evaluated. Patchy sclerosis in both femoral heads suggestive for AVN. Review of the MIP images confirms the above findings. IMPRESSION: Suboptimal evaluation for pulmonary emboli due to poor opacification of the pulmonary arteries as described. Pulmonary emboli cannot be excluded on this examination. No acute chest abnormality. Stable small pulmonary nodules that are probably postinflammatory. No acute abnormality in the abdomen or pelvis. Stable nodularity throughout the spleen. This nodularity has been present since 11/30/2011 and suggestive for a benign etiology. This is most likely associated with the sickle cell anemia. Diffuse bone changes compatible with sickle cell anemia. 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 Electronically Signed   By: Markus Daft M.D.   On: 02/26/2016 14:17     Procedures Procedures (including critical care time)  Medications Ordered in ED Medications  HYDROmorphone (DILAUDID) injection 1 mg (1 mg Intravenous Given 02/26/16 1159)  ondansetron (ZOFRAN) injection 4 mg (4 mg Intravenous Given 02/26/16 1159)  dextrose 5 % bolus 1,000 mL (1,000 mLs Intravenous New Bag/Given 02/26/16 1159)  diphenhydrAMINE (BENADRYL) injection 25 mg (25 mg Intravenous Given 02/26/16 1159)  iopamidol (ISOVUE-300) 61 % injection 15 mL (15 mLs Intravenous Contrast Given 02/26/16 1153)  iopamidol (ISOVUE-370) 76 % injection 100 mL (100 mLs Intravenous Contrast Given 02/26/16 1306)  ketorolac (TORADOL)  15 MG/ML injection 15 mg (15 mg Intravenous Given 02/26/16 1408)     Initial Impression / Assessment and Plan / ED Course  I have reviewed the triage vital signs and the nursing notes.  Pertinent labs & imaging results that were available during my care of the patient were reviewed by me and considered in my medical decision making (see chart for details).  Clinical Course   1. syncope EKG without any acute ischemic changes, arrhythmias, Brugada, prolonged intervals. Given the patient's chest pain CTA PE study was obtained that was of poor quality however no large central emboli noted. Patient's tachycardia improved following IV fluids. She is not hypoxic or hypotensive. Beta hCG negative.  2. SSC Labs his significant leukocytosis which may be emargination from emesis however we'll rule out infectious process and acute chest. Chest x-ray unremarkable. CTA of the chest confirming. CT of the abdomen without any acute inflammatory or infectious process. Urine without evidence of infection. Labs consistent with acute sickle cell crisis. Patient provided with IV fluids and analgesia in the ED with minimal improvement. Patient will be admitted for continued management.  Appreciate hospitalist admission.  Final Clinical Impressions(s) / ED Diagnoses   Final diagnoses:   Sickle cell pain crisis ([place])  Chest pain, unspecified type  Nausea and vomiting, intractability of vomiting not specified, unspecified vomiting type   Disposition: Admit  Condition: Stable    Fatima Blank, MD 02/26/16 [phone]

## 2016-02-26 NOTE — ED Notes (Signed)
Report given to the unit

## 2016-02-26 NOTE — Progress Notes (Signed)
Pt arrived from ED on stretcher, slid herself to bed. Pt oriented to callbell and environment, POC discussed. Awaiting pharmacy to profile PCA in pyxis and will set PCA up. Pt resting comfortably with Kpad at present.

## 2016-02-26 NOTE — ED Notes (Signed)
Unable to collect labs patient has a port she wants access

## 2016-02-26 NOTE — ED Notes (Signed)
Pt cannot use restroom at this time, aware urine specimen is needed.  

## 2016-02-26 NOTE — ED Notes (Signed)
Patient c/o back pain and flank pain bilaterally since last night.  Patient also c/o N/V since last night.  Patient states she believe she had a syncopal episode last night as well.  Patient denies diarrhea and fever.

## 2016-02-27 DIAGNOSIS — R1110 Vomiting, unspecified: Secondary | ICD-10-CM | POA: Diagnosis not present

## 2016-02-27 DIAGNOSIS — S31801A Laceration without foreign body of unspecified buttock, initial encounter: Secondary | ICD-10-CM | POA: Diagnosis not present

## 2016-02-27 DIAGNOSIS — T671XXA Heat syncope, initial encounter: Secondary | ICD-10-CM | POA: Diagnosis not present

## 2016-02-27 DIAGNOSIS — D638 Anemia in other chronic diseases classified elsewhere: Secondary | ICD-10-CM | POA: Diagnosis not present

## 2016-02-27 DIAGNOSIS — F329 Major depressive disorder, single episode, unspecified: Secondary | ICD-10-CM | POA: Diagnosis not present

## 2016-02-27 DIAGNOSIS — R112 Nausea with vomiting, unspecified: Secondary | ICD-10-CM | POA: Diagnosis not present

## 2016-02-27 DIAGNOSIS — D5700 Hb-SS disease with crisis, unspecified: Secondary | ICD-10-CM | POA: Diagnosis not present

## 2016-02-27 LAB — CBC WITH DIFFERENTIAL/PLATELET
BASOS ABS: 0 10*3/uL (ref 0.0–0.1)
BASOS PCT: 0 %
Eosinophils Absolute: 0.9 10*3/uL — ABNORMAL HIGH (ref 0.0–0.7)
Eosinophils Relative: 3 %
HCT: 19.3 % — ABNORMAL LOW (ref 36.0–46.0)
Hemoglobin: 7 g/dL — ABNORMAL LOW (ref 12.0–15.0)
Lymphocytes Relative: 16 %
Lymphs Abs: 4.2 10*3/uL — ABNORMAL HIGH (ref 0.7–4.0)
MCH: 29.5 pg (ref 26.0–34.0)
MCHC: 36.3 g/dL — AB (ref 30.0–36.0)
MCV: 81.4 fL (ref 78.0–100.0)
MONO ABS: 2.1 10*3/uL — AB (ref 0.1–1.0)
Monocytes Relative: 8 %
NEUTROS ABS: 20.1 10*3/uL — AB (ref 1.7–7.7)
Neutrophils Relative %: 74 %
PLATELETS: 451 10*3/uL — AB (ref 150–400)
RBC: 2.37 MIL/uL — AB (ref 3.87–5.11)
RDW: 20.2 % — ABNORMAL HIGH (ref 11.5–15.5)
WBC: 27.3 10*3/uL — AB (ref 4.0–10.5)

## 2016-02-27 LAB — BASIC METABOLIC PANEL
Anion gap: 6 (ref 5–15)
BUN: 10 mg/dL (ref 6–20)
CO2: 26 mmol/L (ref 22–32)
CREATININE: 0.95 mg/dL (ref 0.44–1.00)
Calcium: 8.5 mg/dL — ABNORMAL LOW (ref 8.9–10.3)
Chloride: 105 mmol/L (ref 101–111)
Glucose, Bld: 113 mg/dL — ABNORMAL HIGH (ref 65–99)
POTASSIUM: 3.7 mmol/L (ref 3.5–5.1)
SODIUM: 137 mmol/L (ref 135–145)

## 2016-02-27 LAB — TROPONIN I

## 2016-02-27 MED ORDER — DULOXETINE HCL 30 MG PO CPEP
30.0000 mg | ORAL_CAPSULE | Freq: Every day | ORAL | Status: DC
  Administered 2016-02-27 – 2016-03-03 (×6): 30 mg via ORAL
  Filled 2016-02-27 (×6): qty 1

## 2016-02-27 NOTE — Progress Notes (Signed)
Subjective:  [name] is an opiate tolerant patient with Hb SS who was admitted yesterday for Sickle Cell Pain crisis and Syncope. She reports ongoing pain, 9/10 this morning but no more vomiting, no abdominal pain, no dizziness and no chest pain. She is tolerating PO intake without difficulty. She has no fever.    Objective:  Vital signs in last 24 hours:  Vitals:   02/27/16 0730 02/27/16 1018 02/27/16 1200 02/27/16 1421  BP:  114/65  108/62  Pulse:  91  94  Resp: 18 12 18 12   Temp:  98.9 F (37.2 C)  98.1 F (36.7 C)  TempSrc:  Oral  Oral  SpO2: 92% 92% 92% 96%  Weight:      Height:       Intake/Output from previous day:  Intake/Output Summary (Last 24 hours) at 02/27/16 1459 Last data filed at 02/27/16 1045  Gross per 24 hour  Intake          1977.08 ml  Output             1450 ml  Net           527.08 ml   Physical Exam: General: Alert, awake, oriented x3, in no acute distress.  HEENT: [place]/AT PEERL, EOMI Neck: Trachea midline,  no masses, no thyromegal,y no JVD, no carotid bruit OROPHARYNX:  Moist, No exudate/ erythema/lesions.  Heart: Regular rate and rhythm, without murmurs, rubs, gallops, PMI non-displaced, no heaves or thrills on palpation.  Lungs: Clear to auscultation, no wheezing or rhonchi noted. No increased vocal fremitus resonant to percussion  Abdomen: Soft, nontender, nondistended, positive bowel sounds, no masses no hepatosplenomegaly noted..  Neuro: No focal neurological deficits noted cranial nerves II through XII grossly intact. DTRs 2+ bilaterally upper and lower extremities. Strength 5 out of 5 in bilateral upper and lower extremities. Musculoskeletal: No warm swelling or erythema around joints, no spinal tenderness noted. Psychiatric: Patient alert and oriented x3, good insight and cognition, good recent to remote recall. Lymph node survey: No cervical axillary or inguinal lymphadenopathy noted.  Lab Results:  Basic Metabolic Panel:    Component  Value Date/Time   NA 137 02/27/2016 0025   NA 139 05/06/2012 1645   K 3.7 02/27/2016 0025   K 3.9 05/06/2012 1645   CL 105 02/27/2016 0025   CL 110 (H) 05/06/2012 1645   CO2 26 02/27/2016 0025   CO2 22 05/06/2012 1645   BUN 10 02/27/2016 0025   BUN 9 05/06/2012 1645   CREATININE 0.95 02/27/2016 0025   CREATININE 0.43 (L) 07/06/2015 1356   GLUCOSE 113 (H) 02/27/2016 0025   GLUCOSE 84 05/06/2012 1645   CALCIUM 8.5 (L) 02/27/2016 0025   CALCIUM 8.3 (L) 05/06/2012 1645   CBC:    Component Value Date/Time   WBC 27.3 (H) 02/27/2016 0025   HGB 7.0 (L) 02/27/2016 0025   HGB 8.7 (L) 05/11/2012 0721   HCT 19.3 (L) 02/27/2016 0025   HCT 26.9 (L) 05/11/2012 0721   PLT 451 (H) 02/27/2016 0025   PLT 497 (H) 05/11/2012 0721   MCV 81.4 02/27/2016 0025   MCV 94 05/11/2012 0721   NEUTROABS 20.1 (H) 02/27/2016 0025   NEUTROABS 10.9 (H) 05/10/2012 0552   LYMPHSABS 4.2 (H) 02/27/2016 0025   LYMPHSABS 3.5 05/10/2012 0552   MONOABS 2.1 (H) 02/27/2016 0025   MONOABS 1.7 (H) 05/10/2012 0552   EOSABS 0.9 (H) 02/27/2016 0025   EOSABS 0.8 (H) 05/10/2012 0552   BASOSABS 0.0 02/27/2016 0025  BASOSABS 0.1 05/10/2012 0552   No results found for this or any previous visit (from the past 240 hour(s)).  Studies/Results: Dg Chest 2 View  Result Date: 02/26/2016 CLINICAL DATA:  Chest pain, sickle cell crisis. EXAM: CHEST  2 VIEW COMPARISON:  Radiographs of January 03, 2016. FINDINGS: The heart size and mediastinal contours are within normal limits. Both lungs are clear. No pneumothorax or pleural effusion is noted. Right internal jugular Port-A-Cath is unchanged in position. Skeletal changes are noted consistent with history of sickle cell disease. IMPRESSION: No active cardiopulmonary disease. Electronically Signed   By: Marijo Conception, M.D.   On: 02/26/2016 10:31   Ct Angio Chest Pe W And/or Wo Contrast  Result Date: 02/26/2016 CLINICAL DATA:  25 year old with sickle cell anemia. Patient complains  of back and flank pain. EXAM: CT ANGIOGRAPHY CHEST CT ABDOMEN AND PELVIS WITH CONTRAST TECHNIQUE: Multidetector CT imaging of the chest was performed using the standard protocol during bolus administration of intravenous contrast. Multiplanar CT image reconstructions and MIPs were obtained to evaluate the vascular anatomy. Multidetector CT imaging of the abdomen and pelvis was performed using the standard protocol during bolus administration of intravenous contrast. CONTRAST:  130 mL Isovue 370 There was an issue with contrast leaking from the Port-A-Cath. As a result, contrast was given on 2 occasions. COMPARISON:  07/18/2012 and 11/30/2011 FINDINGS: CTA CHEST FINDINGS Cardiovascular: Study is not adequate to evaluate for pulmonary embolism. There is motion artifact and mixing of contrast and blood in the lobar and distal pulmonary arteries. Normal caliber of the thoracic aorta. Incidentally, the left vertebral artery originates off of the arch which is a normal variant. Right thyroid lobe appears to mildly enlarged but poorly characterized. Central line tip extends into the right atrium region. Mediastinum/Nodes: Small amount of anterior mediastinal tissue is suggestive for residual thymus. Small lymph nodes in the prevascular space. Overall, there is not significant chest lymphadenopathy. No significant axillary lymphadenopathy. Lungs/Pleura: Trachea and mainstem bronchi are patent. Stable pleural-based nodule in the right upper lobe on sequence 17, image 16. Subtle nodular density in the right lower lobe on image 41 is essentially unchanged. There is a bilobed nodular structure in the superior segment of left lower lobe that measures roughly 6 mm and stable. This is seen on sequence 17, image 42. No large areas of airspace disease or consolidation. Musculoskeletal: Again noted is marked sclerosis in the left humeral head which could be related to avascular necrosis. Review of the MIP images confirms the above  findings. CT ABDOMEN and PELVIS FINDINGS Hepatobiliary: Gallbladder has been removed. Normal appearance of the liver without significant biliary dilatation. Portal venous system is patent. Pancreas: Normal appearance of the pancreas without inflammation or duct dilatation. Spleen: The spleen has diffuse nodularity and similar to the previous examination. Similar finding was noted on a CT from 11/30/2011. No significant splenic enlargement. Adrenals/Urinary Tract: Normal adrenal glands. Normal appearance of both kidneys without hydronephrosis. Urinary bladder is unremarkable. Limited evaluation for renal calculi because there is contrast in the renal collecting systems. Stomach/Bowel: Stomach is within normal limits. Appendix appears normal. No evidence of bowel wall thickening, distention, or inflammatory changes. Vascular/Lymphatic: No significant vascular findings are present. No enlarged abdominal or pelvic lymph nodes. Reproductive: Uterus and bilateral adnexa are unremarkable. Other: No free fluid. No free air. Abdominal wall laxity near the umbilicus. There is a tiny umbilical hernia containing fat. Musculoskeletal: Changes in the vertebral body endplates compatible with sickle cell disease. Partially visualized lucency in  the proximal right femur is incompletely evaluated. Patchy sclerosis in both femoral heads suggestive for AVN. Review of the MIP images confirms the above findings. IMPRESSION: Suboptimal evaluation for pulmonary emboli due to poor opacification of the pulmonary arteries as described. Pulmonary emboli cannot be excluded on this examination. No acute chest abnormality. Stable small pulmonary nodules that are probably postinflammatory. No acute abnormality in the abdomen or pelvis. Stable nodularity throughout the spleen. This nodularity has been present since 11/30/2011 and suggestive for a benign etiology. This is most likely associated with the sickle cell anemia. Diffuse bone changes  compatible with sickle cell anemia. 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 Electronically Signed   By: Markus Daft M.D.   On: 02/26/2016 14:17   Ct Abdomen Pelvis W Contrast  Result Date: 02/26/2016 CLINICAL DATA:  25 year old with sickle cell anemia. Patient complains of back and flank pain. EXAM: CT ANGIOGRAPHY CHEST CT ABDOMEN AND PELVIS WITH CONTRAST TECHNIQUE: Multidetector CT imaging of the chest was performed using the standard protocol during bolus administration of intravenous contrast. Multiplanar CT image reconstructions and MIPs were obtained to evaluate the vascular anatomy. Multidetector CT imaging of the abdomen and pelvis was performed using the standard protocol during bolus administration of intravenous contrast. CONTRAST:  130 mL Isovue 370 There was an issue with contrast leaking from the Port-A-Cath. As a result, contrast was given on 2 occasions. COMPARISON:  07/18/2012 and 11/30/2011 FINDINGS: CTA CHEST FINDINGS Cardiovascular: Study is not adequate to evaluate for pulmonary embolism. There is motion artifact and mixing of contrast and blood in the lobar and distal pulmonary arteries. Normal caliber of the thoracic aorta. Incidentally, the left vertebral artery originates off of the arch which is a normal variant. Right thyroid lobe appears to mildly enlarged but poorly characterized. Central line tip extends into the right atrium region. Mediastinum/Nodes: Small amount of anterior mediastinal tissue is suggestive for residual thymus. Small lymph nodes in the prevascular space. Overall, there is not significant chest lymphadenopathy. No significant axillary lymphadenopathy. Lungs/Pleura: Trachea and mainstem bronchi are patent. Stable pleural-based nodule in the right upper lobe on sequence 17, image 16. Subtle nodular density in the right lower lobe on image 41 is essentially unchanged. There is a bilobed nodular structure in the superior  segment of left lower lobe that measures roughly 6 mm and stable. This is seen on sequence 17, image 42. No large areas of airspace disease or consolidation. Musculoskeletal: Again noted is marked sclerosis in the left humeral head which could be related to avascular necrosis. Review of the MIP images confirms the above findings. CT ABDOMEN and PELVIS FINDINGS Hepatobiliary: Gallbladder has been removed. Normal appearance of the liver without significant biliary dilatation. Portal venous system is patent. Pancreas: Normal appearance of the pancreas without inflammation or duct dilatation. Spleen: The spleen has diffuse nodularity and similar to the previous examination. Similar finding was noted on a CT from 11/30/2011. No significant splenic enlargement. Adrenals/Urinary Tract: Normal adrenal glands. Normal appearance of both kidneys without hydronephrosis. Urinary bladder is unremarkable. Limited evaluation for renal calculi because there is contrast in the renal collecting systems. Stomach/Bowel: Stomach is within normal limits. Appendix appears normal. No evidence of bowel wall thickening, distention, or inflammatory changes. Vascular/Lymphatic: No significant vascular findings are present. No enlarged abdominal or pelvic lymph nodes. Reproductive: Uterus and bilateral adnexa are unremarkable. Other: No free fluid. No free air. Abdominal wall laxity near the umbilicus. There is a tiny umbilical hernia  containing fat. Musculoskeletal: Changes in the vertebral body endplates compatible with sickle cell disease. Partially visualized lucency in the proximal right femur is incompletely evaluated. Patchy sclerosis in both femoral heads suggestive for AVN. Review of the MIP images confirms the above findings. IMPRESSION: Suboptimal evaluation for pulmonary emboli due to poor opacification of the pulmonary arteries as described. Pulmonary emboli cannot be excluded on this examination. No acute chest abnormality. Stable  small pulmonary nodules that are probably postinflammatory. No acute abnormality in the abdomen or pelvis. Stable nodularity throughout the spleen. This nodularity has been present since 11/30/2011 and suggestive for a benign etiology. This is most likely associated with the sickle cell anemia. Diffuse bone changes compatible with sickle cell anemia. 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 Electronically Signed   By: Markus Daft M.D.   On: 02/26/2016 14:17   Medications: Scheduled Meds: . enoxaparin (LOVENOX) injection  40 mg Subcutaneous Q24H  . famotidine  20 mg Oral Q12H   Or  . famotidine (PEPCID) IV  20 mg Intravenous Q12H  . HYDROmorphone   Intravenous Q4H  .  HYDROmorphone (DILAUDID) injection  2 mg Intravenous Q2H  . hydroxyurea  1,000 mg Oral Daily  . ketorolac  30 mg Intravenous Q6H  . morphine  30 mg Oral Q12H  . senna-docusate  1 tablet Oral BID  . sodium chloride flush  3 mL Intravenous Q12H   Continuous Infusions: . dextrose 5 % and 0.45% NaCl 125 mL/hr at 02/27/16 0829   PRN Meds:.sodium chloride, cyclobenzaprine, diphenhydrAMINE **OR** diphenhydrAMINE (BENADRYL) IVPB(SICKLE CELL ONLY), naloxone **AND** sodium chloride flush, ondansetron (ZOFRAN) IV, polyethylene glycol, sodium chloride flush, sodium chloride flush  Consultants:  None  Procedures:  None  Antibiotics:  None  Assessment/Plan: Active Problems:   Hb-SS disease with crisis ([place]) 1. Syncope: Cardiac enzymes normal, EKG showed sinus tachycardia, but cardiac monitor has shown no significant findings, HR ranges between 91 - 98. Patient has no further symptom. Will continue to monitor 2. Hb SS with Crisis: Will continue with Dilaudid PCA, Clinician assisted doses and Toradol. Continue IVF and other adjunct therapies.  3. Leukocytosis: Marked leukocytosis associated with crisis and dehydration. No evidence of infection. WBC count decreasing with hydration and   treatment of crisis. Will continue to monitor  4. Dehydration: Pt still mildly dehydrated. Continue IVF. 5. Anemia of Chronic Disease: Hb at baseline. Continue to monitor.  Code Status: Full Code Family Communication: N/A Disposition Plan: Not yet ready for discharge  [name]  If 7PM-7AM, please contact night-coverage.  02/27/2016, 2:59 PM  LOS: 1 day Patient ID: [name], female   DOB: 1991/03/14, 25 y.o.   MRN: [id number]

## 2016-02-28 DIAGNOSIS — D638 Anemia in other chronic diseases classified elsewhere: Secondary | ICD-10-CM | POA: Diagnosis not present

## 2016-02-28 DIAGNOSIS — F329 Major depressive disorder, single episode, unspecified: Secondary | ICD-10-CM | POA: Diagnosis not present

## 2016-02-28 DIAGNOSIS — T671XXA Heat syncope, initial encounter: Secondary | ICD-10-CM | POA: Diagnosis not present

## 2016-02-28 DIAGNOSIS — R1110 Vomiting, unspecified: Secondary | ICD-10-CM | POA: Diagnosis not present

## 2016-02-28 DIAGNOSIS — S31801A Laceration without foreign body of unspecified buttock, initial encounter: Secondary | ICD-10-CM | POA: Diagnosis not present

## 2016-02-28 DIAGNOSIS — D5700 Hb-SS disease with crisis, unspecified: Secondary | ICD-10-CM | POA: Diagnosis not present

## 2016-02-28 NOTE — Progress Notes (Signed)
Patient ID: RANASIA MASSARELLI, female   DOB: 05/21/1990, 25 y.o.   MRN: [id number] Subjective:  Patient slowly improving but pain still at 8/10. No more vomiting, no nausea, no dizziness, no headache, no fever. She is currently on IVF, Toradol, dilaudid PCA and clinician assisted doses Q2H prn. She has no new complaint today.  Objective:  Vital signs in last 24 hours:  Vitals:   02/28/16 0502 02/28/16 0732 02/28/16 0735 02/28/16 1200  BP: (!) 121/95     Pulse: 98     Resp: 16 14 12 11   Temp: 99.3 F (37.4 C)     TempSrc: Oral     SpO2: 93% 94% 93% 94%  Weight:      Height:        Intake/Output from previous day:   Intake/Output Summary (Last 24 hours) at 02/28/16 1245 Last data filed at 02/28/16 1200  Gross per 24 hour  Intake             4090 ml  Output             2400 ml  Net             1690 ml    Physical Exam: General: Alert, awake, oriented x3, in no acute distress.  HEENT: [place]/AT PEERL, EOMI Neck: Trachea midline,  no masses, no thyromegal,y no JVD, no carotid bruit OROPHARYNX:  Moist, No exudate/ erythema/lesions.  Heart: Regular rate and rhythm, without murmurs, rubs, gallops, PMI non-displaced, no heaves or thrills on palpation.  Lungs: Clear to auscultation, no wheezing or rhonchi noted. No increased vocal fremitus resonant to percussion  Abdomen: Soft, nontender, nondistended, positive bowel sounds, no masses no hepatosplenomegaly noted..  Neuro: No focal neurological deficits noted cranial nerves II through XII grossly intact. DTRs 2+ bilaterally upper and lower extremities. Strength 5 out of 5 in bilateral upper and lower extremities. Musculoskeletal: No warm swelling or erythema around joints, no spinal tenderness noted. Psychiatric: Patient alert and oriented x3, good insight and cognition, good recent to remote recall. Lymph node survey: No cervical axillary or inguinal lymphadenopathy noted.  Lab Results:  Basic Metabolic Panel:    Component Value  Date/Time   NA 137 02/27/2016 0025   NA 139 05/06/2012 1645   K 3.7 02/27/2016 0025   K 3.9 05/06/2012 1645   CL 105 02/27/2016 0025   CL 110 (H) 05/06/2012 1645   CO2 26 02/27/2016 0025   CO2 22 05/06/2012 1645   BUN 10 02/27/2016 0025   BUN 9 05/06/2012 1645   CREATININE 0.95 02/27/2016 0025   CREATININE 0.43 (L) 07/06/2015 1356   GLUCOSE 113 (H) 02/27/2016 0025   GLUCOSE 84 05/06/2012 1645   CALCIUM 8.5 (L) 02/27/2016 0025   CALCIUM 8.3 (L) 05/06/2012 1645   CBC:    Component Value Date/Time   WBC 27.3 (H) 02/27/2016 0025   HGB 7.0 (L) 02/27/2016 0025   HGB 8.7 (L) 05/11/2012 0721   HCT 19.3 (L) 02/27/2016 0025   HCT 26.9 (L) 05/11/2012 0721   PLT 451 (H) 02/27/2016 0025   PLT 497 (H) 05/11/2012 0721   MCV 81.4 02/27/2016 0025   MCV 94 05/11/2012 0721   NEUTROABS 20.1 (H) 02/27/2016 0025   NEUTROABS 10.9 (H) 05/10/2012 0552   LYMPHSABS 4.2 (H) 02/27/2016 0025   LYMPHSABS 3.5 05/10/2012 0552   MONOABS 2.1 (H) 02/27/2016 0025   MONOABS 1.7 (H) 05/10/2012 0552   EOSABS 0.9 (H) 02/27/2016 0025   EOSABS 0.8 (H) 05/10/2012  0552   BASOSABS 0.0 02/27/2016 0025   BASOSABS 0.1 05/10/2012 0552    No results found for this or any previous visit (from the past 240 hour(s)).  Studies/Results: Ct Angio Chest Pe W And/or Wo Contrast  Result Date: 02/26/2016 CLINICAL DATA:  25 year old with sickle cell anemia. Patient complains of back and flank pain. EXAM: CT ANGIOGRAPHY CHEST CT ABDOMEN AND PELVIS WITH CONTRAST TECHNIQUE: Multidetector CT imaging of the chest was performed using the standard protocol during bolus administration of intravenous contrast. Multiplanar CT image reconstructions and MIPs were obtained to evaluate the vascular anatomy. Multidetector CT imaging of the abdomen and pelvis was performed using the standard protocol during bolus administration of intravenous contrast. CONTRAST:  130 mL Isovue 370 There was an issue with contrast leaking from the Port-A-Cath. As  a result, contrast was given on 2 occasions. COMPARISON:  07/18/2012 and 11/30/2011 FINDINGS: CTA CHEST FINDINGS Cardiovascular: Study is not adequate to evaluate for pulmonary embolism. There is motion artifact and mixing of contrast and blood in the lobar and distal pulmonary arteries. Normal caliber of the thoracic aorta. Incidentally, the left vertebral artery originates off of the arch which is a normal variant. Right thyroid lobe appears to mildly enlarged but poorly characterized. Central line tip extends into the right atrium region. Mediastinum/Nodes: Small amount of anterior mediastinal tissue is suggestive for residual thymus. Small lymph nodes in the prevascular space. Overall, there is not significant chest lymphadenopathy. No significant axillary lymphadenopathy. Lungs/Pleura: Trachea and mainstem bronchi are patent. Stable pleural-based nodule in the right upper lobe on sequence 17, image 16. Subtle nodular density in the right lower lobe on image 41 is essentially unchanged. There is a bilobed nodular structure in the superior segment of left lower lobe that measures roughly 6 mm and stable. This is seen on sequence 17, image 42. No large areas of airspace disease or consolidation. Musculoskeletal: Again noted is marked sclerosis in the left humeral head which could be related to avascular necrosis. Review of the MIP images confirms the above findings. CT ABDOMEN and PELVIS FINDINGS Hepatobiliary: Gallbladder has been removed. Normal appearance of the liver without significant biliary dilatation. Portal venous system is patent. Pancreas: Normal appearance of the pancreas without inflammation or duct dilatation. Spleen: The spleen has diffuse nodularity and similar to the previous examination. Similar finding was noted on a CT from 11/30/2011. No significant splenic enlargement. Adrenals/Urinary Tract: Normal adrenal glands. Normal appearance of both kidneys without hydronephrosis. Urinary bladder is  unremarkable. Limited evaluation for renal calculi because there is contrast in the renal collecting systems. Stomach/Bowel: Stomach is within normal limits. Appendix appears normal. No evidence of bowel wall thickening, distention, or inflammatory changes. Vascular/Lymphatic: No significant vascular findings are present. No enlarged abdominal or pelvic lymph nodes. Reproductive: Uterus and bilateral adnexa are unremarkable. Other: No free fluid. No free air. Abdominal wall laxity near the umbilicus. There is a tiny umbilical hernia containing fat. Musculoskeletal: Changes in the vertebral body endplates compatible with sickle cell disease. Partially visualized lucency in the proximal right femur is incompletely evaluated. Patchy sclerosis in both femoral heads suggestive for AVN. Review of the MIP images confirms the above findings. IMPRESSION: Suboptimal evaluation for pulmonary emboli due to poor opacification of the pulmonary arteries as described. Pulmonary emboli cannot be excluded on this examination. No acute chest abnormality. Stable small pulmonary nodules that are probably postinflammatory. No acute abnormality in the abdomen or pelvis. Stable nodularity throughout the spleen. This nodularity has been present since 11/30/2011  and suggestive for a benign etiology. This is most likely associated with the sickle cell anemia. Diffuse bone changes compatible with sickle cell anemia. 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 Electronically Signed   By: Markus Daft M.D.   On: 02/26/2016 14:17   Ct Abdomen Pelvis W Contrast  Result Date: 02/26/2016 CLINICAL DATA:  25 year old with sickle cell anemia. Patient complains of back and flank pain. EXAM: CT ANGIOGRAPHY CHEST CT ABDOMEN AND PELVIS WITH CONTRAST TECHNIQUE: Multidetector CT imaging of the chest was performed using the standard protocol during bolus administration of intravenous contrast. Multiplanar CT image  reconstructions and MIPs were obtained to evaluate the vascular anatomy. Multidetector CT imaging of the abdomen and pelvis was performed using the standard protocol during bolus administration of intravenous contrast. CONTRAST:  130 mL Isovue 370 There was an issue with contrast leaking from the Port-A-Cath. As a result, contrast was given on 2 occasions. COMPARISON:  07/18/2012 and 11/30/2011 FINDINGS: CTA CHEST FINDINGS Cardiovascular: Study is not adequate to evaluate for pulmonary embolism. There is motion artifact and mixing of contrast and blood in the lobar and distal pulmonary arteries. Normal caliber of the thoracic aorta. Incidentally, the left vertebral artery originates off of the arch which is a normal variant. Right thyroid lobe appears to mildly enlarged but poorly characterized. Central line tip extends into the right atrium region. Mediastinum/Nodes: Small amount of anterior mediastinal tissue is suggestive for residual thymus. Small lymph nodes in the prevascular space. Overall, there is not significant chest lymphadenopathy. No significant axillary lymphadenopathy. Lungs/Pleura: Trachea and mainstem bronchi are patent. Stable pleural-based nodule in the right upper lobe on sequence 17, image 16. Subtle nodular density in the right lower lobe on image 41 is essentially unchanged. There is a bilobed nodular structure in the superior segment of left lower lobe that measures roughly 6 mm and stable. This is seen on sequence 17, image 42. No large areas of airspace disease or consolidation. Musculoskeletal: Again noted is marked sclerosis in the left humeral head which could be related to avascular necrosis. Review of the MIP images confirms the above findings. CT ABDOMEN and PELVIS FINDINGS Hepatobiliary: Gallbladder has been removed. Normal appearance of the liver without significant biliary dilatation. Portal venous system is patent. Pancreas: Normal appearance of the pancreas without inflammation  or duct dilatation. Spleen: The spleen has diffuse nodularity and similar to the previous examination. Similar finding was noted on a CT from 11/30/2011. No significant splenic enlargement. Adrenals/Urinary Tract: Normal adrenal glands. Normal appearance of both kidneys without hydronephrosis. Urinary bladder is unremarkable. Limited evaluation for renal calculi because there is contrast in the renal collecting systems. Stomach/Bowel: Stomach is within normal limits. Appendix appears normal. No evidence of bowel wall thickening, distention, or inflammatory changes. Vascular/Lymphatic: No significant vascular findings are present. No enlarged abdominal or pelvic lymph nodes. Reproductive: Uterus and bilateral adnexa are unremarkable. Other: No free fluid. No free air. Abdominal wall laxity near the umbilicus. There is a tiny umbilical hernia containing fat. Musculoskeletal: Changes in the vertebral body endplates compatible with sickle cell disease. Partially visualized lucency in the proximal right femur is incompletely evaluated. Patchy sclerosis in both femoral heads suggestive for AVN. Review of the MIP images confirms the above findings. IMPRESSION: Suboptimal evaluation for pulmonary emboli due to poor opacification of the pulmonary arteries as described. Pulmonary emboli cannot be excluded on this examination. No acute chest abnormality. Stable small pulmonary nodules that are probably postinflammatory. No acute  abnormality in the abdomen or pelvis. Stable nodularity throughout the spleen. This nodularity has been present since 11/30/2011 and suggestive for a benign etiology. This is most likely associated with the sickle cell anemia. Diffuse bone changes compatible with sickle cell anemia. 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 Electronically Signed   By: Markus Daft M.D.   On: 02/26/2016 14:17    Medications: Scheduled Meds: . DULoxetine  30 mg Oral Daily   . enoxaparin (LOVENOX) injection  40 mg Subcutaneous Q24H  . famotidine  20 mg Oral Q12H  . HYDROmorphone   Intravenous Q4H  .  HYDROmorphone (DILAUDID) injection  2 mg Intravenous Q2H  . hydroxyurea  1,000 mg Oral Daily  . ketorolac  30 mg Intravenous Q6H  . morphine  30 mg Oral Q12H  . senna-docusate  1 tablet Oral BID  . sodium chloride flush  3 mL Intravenous Q12H   Continuous Infusions: . dextrose 5 % and 0.45% NaCl 125 mL/hr at 02/28/16 1234   PRN Meds:.sodium chloride, cyclobenzaprine, diphenhydrAMINE **OR** diphenhydrAMINE (BENADRYL) IVPB(SICKLE CELL ONLY), naloxone **AND** sodium chloride flush, ondansetron (ZOFRAN) IV, polyethylene glycol, sodium chloride flush, sodium chloride flush  Consultants:  None  Procedures:  None  Antibiotics:  None  Assessment/Plan: Active Problems:   Hb-SS disease with crisis ([place])  1. Syncope: Cardiac enzymes normal, EKG showed sinus tachycardia, but cardiac monitor has shown no significant findings, HR ranges between 91 - 98. Patient has no further symptom. Will discontinue cardiac monitoring, transfer patient to medsurg 2. Hb SS with Crisis: Will continue with Dilaudid PCA, Clinician assisted doses and Toradol at the current doses because patient continued to have pain rated at 8/10. Continue IVF and other adjunct therapies.  3. Leukocytosis: Marked leukocytosis associated with crisis and dehydration. No evidence of infection. WBC count decreasing with hydration and  treatment of crisis. Will continue to monitor. Labs in am 4. Dehydration: Continue IVF and reevaluate tomorrow 5. Anemia of Chronic Disease: Hb at baseline. Continue to monitor.  Code Status: Full Code Family Communication: N/A Disposition Plan: Not yet ready for discharge  Livio Ledwith  If 7PM-7AM, please contact night-coverage.  02/28/2016, 12:45 PM  LOS: 2 days

## 2016-02-28 NOTE — Progress Notes (Signed)
Report given to Waller, RN, on 3W. Pt aware of transfer to 1336 and agreeable.

## 2016-02-29 DIAGNOSIS — R1110 Vomiting, unspecified: Secondary | ICD-10-CM | POA: Diagnosis not present

## 2016-02-29 DIAGNOSIS — D638 Anemia in other chronic diseases classified elsewhere: Secondary | ICD-10-CM | POA: Diagnosis not present

## 2016-02-29 DIAGNOSIS — F329 Major depressive disorder, single episode, unspecified: Secondary | ICD-10-CM | POA: Diagnosis not present

## 2016-02-29 DIAGNOSIS — T671XXA Heat syncope, initial encounter: Secondary | ICD-10-CM | POA: Diagnosis not present

## 2016-02-29 DIAGNOSIS — D72829 Elevated white blood cell count, unspecified: Secondary | ICD-10-CM | POA: Diagnosis not present

## 2016-02-29 DIAGNOSIS — S31801A Laceration without foreign body of unspecified buttock, initial encounter: Secondary | ICD-10-CM | POA: Diagnosis not present

## 2016-02-29 DIAGNOSIS — D5700 Hb-SS disease with crisis, unspecified: Secondary | ICD-10-CM | POA: Diagnosis not present

## 2016-02-29 LAB — CBC WITH DIFFERENTIAL/PLATELET
BAND NEUTROPHILS: 1 %
BLASTS: 0 %
Basophils Absolute: 0.3 10*3/uL — ABNORMAL HIGH (ref 0.0–0.1)
Basophils Relative: 2 %
EOS ABS: 2.1 10*3/uL — AB (ref 0.0–0.7)
Eosinophils Relative: 15 %
HEMATOCRIT: 16.7 % — AB (ref 36.0–46.0)
Hemoglobin: 5.9 g/dL — CL (ref 12.0–15.0)
LYMPHS PCT: 17 %
Lymphs Abs: 2.4 10*3/uL (ref 0.7–4.0)
MCH: 29.8 pg (ref 26.0–34.0)
MCHC: 35.3 g/dL (ref 30.0–36.0)
MCV: 84.3 fL (ref 78.0–100.0)
MONOS PCT: 9 %
Metamyelocytes Relative: 0 %
Monocytes Absolute: 1.3 10*3/uL — ABNORMAL HIGH (ref 0.1–1.0)
Myelocytes: 0 %
NEUTROS ABS: 8.1 10*3/uL — AB (ref 1.7–7.7)
Neutrophils Relative %: 56 %
OTHER: 0 %
Platelets: 409 10*3/uL — ABNORMAL HIGH (ref 150–400)
Promyelocytes Absolute: 0 %
RBC: 1.98 MIL/uL — AB (ref 3.87–5.11)
RDW: 23.3 % — AB (ref 11.5–15.5)
WBC: 14.2 10*3/uL — ABNORMAL HIGH (ref 4.0–10.5)
nRBC: 5 /100 WBC — ABNORMAL HIGH

## 2016-02-29 LAB — TYPE AND SCREEN
ABO/RH(D): B POS
Antibody Screen: NEGATIVE

## 2016-02-29 LAB — COMPREHENSIVE METABOLIC PANEL
ALBUMIN: 3.6 g/dL (ref 3.5–5.0)
ALT: 21 U/L (ref 14–54)
ANION GAP: 4 — AB (ref 5–15)
AST: 25 U/L (ref 15–41)
Alkaline Phosphatase: 47 U/L (ref 38–126)
BILIRUBIN TOTAL: 2.7 mg/dL — AB (ref 0.3–1.2)
BUN: 7 mg/dL (ref 6–20)
CHLORIDE: 109 mmol/L (ref 101–111)
CO2: 27 mmol/L (ref 22–32)
Calcium: 8.3 mg/dL — ABNORMAL LOW (ref 8.9–10.3)
Creatinine, Ser: 0.85 mg/dL (ref 0.44–1.00)
GFR calc Af Amer: >60 mL/min (ref >60)
GFR calc non Af Amer: >60 mL/min (ref >60)
GLUCOSE: 110 mg/dL — AB (ref 65–99)
POTASSIUM: 4 mmol/L (ref 3.5–5.1)
SODIUM: 140 mmol/L (ref 135–145)
TOTAL PROTEIN: 6.3 g/dL — AB (ref 6.5–8.1)

## 2016-02-29 LAB — RETICULOCYTES
RBC.: 1.95 MIL/uL — AB (ref 3.87–5.11)
Retic Ct Pct: >23 % — ABNORMAL HIGH (ref 0.4–3.1)

## 2016-02-29 LAB — LACTATE DEHYDROGENASE: LDH: 327 U/L — ABNORMAL HIGH (ref 98–192)

## 2016-02-29 MED ORDER — SODIUM CHLORIDE 0.9 % IV SOLN
25.0000 mg | INTRAVENOUS | Status: DC | PRN
  Filled 2016-02-29: qty 0.5

## 2016-02-29 MED ORDER — ZINC OXIDE 40 % EX OINT
1.0000 "application " | TOPICAL_OINTMENT | Freq: Two times a day (BID) | CUTANEOUS | Status: DC | PRN
  Filled 2016-02-29: qty 57

## 2016-02-29 MED ORDER — HYDROMORPHONE HCL 2 MG/ML IJ SOLN
2.0000 mg | INTRAMUSCULAR | Status: DC | PRN
  Administered 2016-02-29 – 2016-03-01 (×11): 2 mg via INTRAVENOUS
  Filled 2016-02-29 (×10): qty 1

## 2016-02-29 MED ORDER — DIPHENHYDRAMINE HCL 25 MG PO CAPS
25.0000 mg | ORAL_CAPSULE | ORAL | Status: DC | PRN
  Administered 2016-03-01 – 2016-03-03 (×6): 25 mg via ORAL
  Filled 2016-02-29 (×7): qty 1

## 2016-02-29 NOTE — Progress Notes (Signed)
PHARMACY BRIEF NOTE: HYDROXYUREA   By [facility] policy, hydroxyurea is automatically held when any of the following laboratory values occur:  ANC < 2 K  Pltc < 80K in sickle-cell patients; < 100K in other patients  Hgb <= 6 in sickle-cell patients; < 8 in other patients  Reticulocytes < 80K when Hgb < 9  Hydroxyurea has been held (discontinued from profile) per policy.   Hgb: 5.9  Dolly Rias RPh 02/29/2016, 9:40 AM Pager [phone]

## 2016-02-29 NOTE — Progress Notes (Addendum)
CRITICAL VALUE ALERT  Critical value received:  Hemoglobin 5.9  Date of notification:  02/29/16  Time of notification:  [id number]  Critical value read back:Yes.    Nurse who received alert:  Hortencia Conradi RN   MD notified (1st page):  Schorr NP  Time of first page:  [phone]  MD notified (2nd page):  Time of second page:  Responding MD:  Schorr NP  Time MD responded:  [phone]

## 2016-02-29 NOTE — Progress Notes (Signed)
SICKLE CELL SERVICE PROGRESS NOTE  [name] [id number] DOB: June 21, 1990 DOA: 02/26/2016 PCP: Angelica Chessman, MD  Assessment/Plan: Active Problems:   Hb-SS disease with crisis ([place])  1. Hb SS with crisis: Continue PCA at current dose and change intermittent doses to PRN basis. Decrease IVF to [facility]. 2. Leukocytosis: Decreased from 34.9K to 14K today,. The leukocytosis is likely due to sickle cell crisis. Will continue to monitor. 3. Anemia of chronic disease: Pt has had a decrease in Hb which is likely dilutional. I have  stopped the IVF and am checking LDH as patient has no evidence of blood loss. Pt hemodynamically stable. Will hold on any transfusion.  4. Chronic pain: Per office notes and [facility] review patient is prescribed MS Contin 30 mg BID. However she reports that she has been taking MS Contin 60 mg daily with the consent of her PMD. I will continue MS Contin 30 mg BID as prescribed.  5. Skin tear: Pt has a skin tear in the crevice of the buttocks wihtout any evidence of infection. Will recommend barrier creak to prevent excess moisture and maceration of the area, and leave area open to air.   Code Status: Full Code Family Communication: N/A Disposition Plan: Not yet ready for discharge  [place].  Pager [phone]. If 7PM-7AM, please contact night-coverage.  02/29/2016, 11:53 AM  LOS: 3 days   Interim History: Pt reports that her pain is 7/10 today and localized to her back down from 10/10. Patient reports that she has been taking 60 mg of MS contin daily at home instead of 30 mg BID. She reports that she discussed this with her PMD and was advised by him that it was okay to change her usage from 30 mg BID to 60 mg daily. I have advised patient that while hospitalized she will receive prescription as written- 30 mg BID.   Consultants:  None  Procedures:  None  Antibiotics:  None    Objective: Vitals:   02/29/16 0400 02/29/16 0612 02/29/16  0759 02/29/16 0959  BP:  121/76  112/63  Pulse:  (!) 108  100  Resp: 14 12 17 16   Temp:  98.4 F (36.9 C)  97.9 F (36.6 C)  TempSrc:  Oral  Oral  SpO2: 94% 100% 92% 93%  Weight:  74 kg (163 lb 2.3 oz)    Height:       Weight change:   Intake/Output Summary (Last 24 hours) at 02/29/16 1153 Last data filed at 02/29/16 0959  Gross per 24 hour  Intake          2619.58 ml  Output              900 ml  Net          1719.58 ml    General: Alert, awake, oriented x3, in no acute distress.  HEENT: [place]/AT PEERL, EOMI, anicteric Neck: Trachea midline,  no masses, no thyromegal,y no JVD, no carotid bruit Heart: Regular rate and rhythm, without murmurs, rubs, gallops, PMI non-displaced, no heaves or thrills on palpation.  Lungs: Clear to auscultation, no wheezing or rhonchi noted. No increased vocal fremitus resonant to percussion  Abdomen: Soft, nontender, nondistended, positive bowel sounds, no masses no hepatosplenomegaly noted.  Neuro: No focal neurological deficits noted cranial nerves II through XII grossly intact.  Strength at baseline in bilateral upper and lower extremities. Musculoskeletal: No warmth swelling or erythema around joints, no spinal tenderness noted. Psychiatric: Patient alert and oriented x3, good insight  and cognition, good recent to remote recall. Skin: Small area of tear in the crevice of the buttocks. No erythema, warmth or redness.    Data Reviewed: Basic Metabolic Panel:  Recent Labs Lab 02/26/16 1052 02/27/16 0025 02/29/16 0405  NA 140 137 140  K 4.0 3.7 4.0  CL 108 105 109  CO2 25 26 27   GLUCOSE 101* 113* 110*  BUN 14 10 7   CREATININE 0.89 0.95 0.85  CALCIUM 9.2 8.5* 8.3*   Liver Function Tests:  Recent Labs Lab 02/26/16 1052 02/29/16 0405  AST 51* 25  ALT 19 21  ALKPHOS 49 47  BILITOT 3.9* 2.7*  PROT 7.6 6.3*  ALBUMIN 4.6 3.6    Recent Labs Lab 02/26/16 1052  LIPASE 25   No results for input(s): AMMONIA in the last 168  hours. CBC:  Recent Labs Lab 02/26/16 1052 02/27/16 0025 02/29/16 0405  WBC 34.9* 27.3* 14.2*  NEUTROABS 31.8* 20.1* 8.1*  HGB 7.4* 7.0* 5.9*  HCT 20.3* 19.3* 16.7*  MCV 81.5 81.4 84.3  PLT 359 451* 409*   Cardiac Enzymes:  Recent Labs Lab 02/26/16 1859 02/27/16 0025 02/27/16 0627  TROPONINI <0.03 <0.03 <0.03   BNP (last 3 results) No results for input(s): BNP in the last 8760 hours.  ProBNP (last 3 results) No results for input(s): PROBNP in the last 8760 hours.  CBG: No results for input(s): GLUCAP in the last 168 hours.  No results found for this or any previous visit (from the past 240 hour(s)).   Studies: Dg Chest 2 View  Result Date: 02/26/2016 CLINICAL DATA:  Chest pain, sickle cell crisis. EXAM: CHEST  2 VIEW COMPARISON:  Radiographs of January 03, 2016. FINDINGS: The heart size and mediastinal contours are within normal limits. Both lungs are clear. No pneumothorax or pleural effusion is noted. Right internal jugular Port-A-Cath is unchanged in position. Skeletal changes are noted consistent with history of sickle cell disease. IMPRESSION: No active cardiopulmonary disease. Electronically Signed   By: Marijo Conception, M.D.   On: 02/26/2016 10:31   Ct Angio Chest Pe W And/or Wo Contrast  Result Date: 02/26/2016 CLINICAL DATA:  25 year old with sickle cell anemia. Patient complains of back and flank pain. EXAM: CT ANGIOGRAPHY CHEST CT ABDOMEN AND PELVIS WITH CONTRAST TECHNIQUE: Multidetector CT imaging of the chest was performed using the standard protocol during bolus administration of intravenous contrast. Multiplanar CT image reconstructions and MIPs were obtained to evaluate the vascular anatomy. Multidetector CT imaging of the abdomen and pelvis was performed using the standard protocol during bolus administration of intravenous contrast. CONTRAST:  130 mL Isovue 370 There was an issue with contrast leaking from the Port-A-Cath. As a result, contrast was given  on 2 occasions. COMPARISON:  07/18/2012 and 11/30/2011 FINDINGS: CTA CHEST FINDINGS Cardiovascular: Study is not adequate to evaluate for pulmonary embolism. There is motion artifact and mixing of contrast and blood in the lobar and distal pulmonary arteries. Normal caliber of the thoracic aorta. Incidentally, the left vertebral artery originates off of the arch which is a normal variant. Right thyroid lobe appears to mildly enlarged but poorly characterized. Central line tip extends into the right atrium region. Mediastinum/Nodes: Small amount of anterior mediastinal tissue is suggestive for residual thymus. Small lymph nodes in the prevascular space. Overall, there is not significant chest lymphadenopathy. No significant axillary lymphadenopathy. Lungs/Pleura: Trachea and mainstem bronchi are patent. Stable pleural-based nodule in the right upper lobe on sequence 17, image 16. Subtle nodular density in the  right lower lobe on image 41 is essentially unchanged. There is a bilobed nodular structure in the superior segment of left lower lobe that measures roughly 6 mm and stable. This is seen on sequence 17, image 42. No large areas of airspace disease or consolidation. Musculoskeletal: Again noted is marked sclerosis in the left humeral head which could be related to avascular necrosis. Review of the MIP images confirms the above findings. CT ABDOMEN and PELVIS FINDINGS Hepatobiliary: Gallbladder has been removed. Normal appearance of the liver without significant biliary dilatation. Portal venous system is patent. Pancreas: Normal appearance of the pancreas without inflammation or duct dilatation. Spleen: The spleen has diffuse nodularity and similar to the previous examination. Similar finding was noted on a CT from 11/30/2011. No significant splenic enlargement. Adrenals/Urinary Tract: Normal adrenal glands. Normal appearance of both kidneys without hydronephrosis. Urinary bladder is unremarkable. Limited  evaluation for renal calculi because there is contrast in the renal collecting systems. Stomach/Bowel: Stomach is within normal limits. Appendix appears normal. No evidence of bowel wall thickening, distention, or inflammatory changes. Vascular/Lymphatic: No significant vascular findings are present. No enlarged abdominal or pelvic lymph nodes. Reproductive: Uterus and bilateral adnexa are unremarkable. Other: No free fluid. No free air. Abdominal wall laxity near the umbilicus. There is a tiny umbilical hernia containing fat. Musculoskeletal: Changes in the vertebral body endplates compatible with sickle cell disease. Partially visualized lucency in the proximal right femur is incompletely evaluated. Patchy sclerosis in both femoral heads suggestive for AVN. Review of the MIP images confirms the above findings. IMPRESSION: Suboptimal evaluation for pulmonary emboli due to poor opacification of the pulmonary arteries as described. Pulmonary emboli cannot be excluded on this examination. No acute chest abnormality. Stable small pulmonary nodules that are probably postinflammatory. No acute abnormality in the abdomen or pelvis. Stable nodularity throughout the spleen. This nodularity has been present since 11/30/2011 and suggestive for a benign etiology. This is most likely associated with the sickle cell anemia. Diffuse bone changes compatible with sickle cell anemia. 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 Electronically Signed   By: Markus Daft M.D.   On: 02/26/2016 14:17   Ct Abdomen Pelvis W Contrast  Result Date: 02/26/2016 CLINICAL DATA:  25 year old with sickle cell anemia. Patient complains of back and flank pain. EXAM: CT ANGIOGRAPHY CHEST CT ABDOMEN AND PELVIS WITH CONTRAST TECHNIQUE: Multidetector CT imaging of the chest was performed using the standard protocol during bolus administration of intravenous contrast. Multiplanar CT image reconstructions and  MIPs were obtained to evaluate the vascular anatomy. Multidetector CT imaging of the abdomen and pelvis was performed using the standard protocol during bolus administration of intravenous contrast. CONTRAST:  130 mL Isovue 370 There was an issue with contrast leaking from the Port-A-Cath. As a result, contrast was given on 2 occasions. COMPARISON:  07/18/2012 and 11/30/2011 FINDINGS: CTA CHEST FINDINGS Cardiovascular: Study is not adequate to evaluate for pulmonary embolism. There is motion artifact and mixing of contrast and blood in the lobar and distal pulmonary arteries. Normal caliber of the thoracic aorta. Incidentally, the left vertebral artery originates off of the arch which is a normal variant. Right thyroid lobe appears to mildly enlarged but poorly characterized. Central line tip extends into the right atrium region. Mediastinum/Nodes: Small amount of anterior mediastinal tissue is suggestive for residual thymus. Small lymph nodes in the prevascular space. Overall, there is not significant chest lymphadenopathy. No significant axillary lymphadenopathy. Lungs/Pleura: Trachea and mainstem bronchi are patent.  Stable pleural-based nodule in the right upper lobe on sequence 17, image 16. Subtle nodular density in the right lower lobe on image 41 is essentially unchanged. There is a bilobed nodular structure in the superior segment of left lower lobe that measures roughly 6 mm and stable. This is seen on sequence 17, image 42. No large areas of airspace disease or consolidation. Musculoskeletal: Again noted is marked sclerosis in the left humeral head which could be related to avascular necrosis. Review of the MIP images confirms the above findings. CT ABDOMEN and PELVIS FINDINGS Hepatobiliary: Gallbladder has been removed. Normal appearance of the liver without significant biliary dilatation. Portal venous system is patent. Pancreas: Normal appearance of the pancreas without inflammation or duct dilatation.  Spleen: The spleen has diffuse nodularity and similar to the previous examination. Similar finding was noted on a CT from 11/30/2011. No significant splenic enlargement. Adrenals/Urinary Tract: Normal adrenal glands. Normal appearance of both kidneys without hydronephrosis. Urinary bladder is unremarkable. Limited evaluation for renal calculi because there is contrast in the renal collecting systems. Stomach/Bowel: Stomach is within normal limits. Appendix appears normal. No evidence of bowel wall thickening, distention, or inflammatory changes. Vascular/Lymphatic: No significant vascular findings are present. No enlarged abdominal or pelvic lymph nodes. Reproductive: Uterus and bilateral adnexa are unremarkable. Other: No free fluid. No free air. Abdominal wall laxity near the umbilicus. There is a tiny umbilical hernia containing fat. Musculoskeletal: Changes in the vertebral body endplates compatible with sickle cell disease. Partially visualized lucency in the proximal right femur is incompletely evaluated. Patchy sclerosis in both femoral heads suggestive for AVN. Review of the MIP images confirms the above findings. IMPRESSION: Suboptimal evaluation for pulmonary emboli due to poor opacification of the pulmonary arteries as described. Pulmonary emboli cannot be excluded on this examination. No acute chest abnormality. Stable small pulmonary nodules that are probably postinflammatory. No acute abnormality in the abdomen or pelvis. Stable nodularity throughout the spleen. This nodularity has been present since 11/30/2011 and suggestive for a benign etiology. This is most likely associated with the sickle cell anemia. Diffuse bone changes compatible with sickle cell anemia. 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 Electronically Signed   By: Markus Daft M.D.   On: 02/26/2016 14:17    Scheduled Meds: . DULoxetine  30 mg Oral Daily  . enoxaparin (LOVENOX) injection   40 mg Subcutaneous Q24H  . famotidine  20 mg Oral Q12H  . HYDROmorphone   Intravenous Q4H  . ketorolac  30 mg Intravenous Q6H  . morphine  30 mg Oral Q12H  . senna-docusate  1 tablet Oral BID  . sodium chloride flush  3 mL Intravenous Q12H   Continuous Infusions: . dextrose 5 % and 0.45% NaCl 125 mL/hr at 02/29/16 1020    Active Problems:   Hb-SS disease with crisis ([name])   In excess of 30 minutes spent during this visit. Greater than 50% involved face to face contact with the patient for assessment, counseling and coordination of care.

## 2016-03-01 DIAGNOSIS — F329 Major depressive disorder, single episode, unspecified: Secondary | ICD-10-CM | POA: Diagnosis not present

## 2016-03-01 DIAGNOSIS — S31801A Laceration without foreign body of unspecified buttock, initial encounter: Secondary | ICD-10-CM | POA: Diagnosis not present

## 2016-03-01 DIAGNOSIS — D5700 Hb-SS disease with crisis, unspecified: Secondary | ICD-10-CM | POA: Diagnosis not present

## 2016-03-01 DIAGNOSIS — T671XXA Heat syncope, initial encounter: Secondary | ICD-10-CM | POA: Diagnosis not present

## 2016-03-01 DIAGNOSIS — R1110 Vomiting, unspecified: Secondary | ICD-10-CM | POA: Diagnosis not present

## 2016-03-01 DIAGNOSIS — D638 Anemia in other chronic diseases classified elsewhere: Secondary | ICD-10-CM | POA: Diagnosis not present

## 2016-03-01 LAB — CBC WITH DIFFERENTIAL/PLATELET
BASOS ABS: 0 10*3/uL (ref 0.0–0.1)
BASOS PCT: 0 %
Band Neutrophils: 0 %
Blasts: 0 %
Eosinophils Absolute: 2.4 10*3/uL — ABNORMAL HIGH (ref 0.0–0.7)
Eosinophils Relative: 15 %
HCT: 18.3 % — ABNORMAL LOW (ref 36.0–46.0)
Hemoglobin: 6.3 g/dL — CL (ref 12.0–15.0)
LYMPHS ABS: 3.2 10*3/uL (ref 0.7–4.0)
Lymphocytes Relative: 20 %
MCH: 30.4 pg (ref 26.0–34.0)
MCHC: 34.4 g/dL (ref 30.0–36.0)
MCV: 88.4 fL (ref 78.0–100.0)
METAMYELOCYTES PCT: 0 %
MONO ABS: 1.4 10*3/uL — AB (ref 0.1–1.0)
MYELOCYTES: 2 %
Monocytes Relative: 9 %
NEUTROS PCT: 54 %
NRBC: 26 /100{WBCs} — AB
Neutro Abs: 9.1 10*3/uL — ABNORMAL HIGH (ref 1.7–7.7)
Other: 0 %
PLATELETS: 474 10*3/uL — AB (ref 150–400)
PROMYELOCYTES ABS: 0 %
RBC: 2.07 MIL/uL — AB (ref 3.87–5.11)
RDW: 27 % — AB (ref 11.5–15.5)
WBC: 16.1 10*3/uL — AB (ref 4.0–10.5)

## 2016-03-01 MED ORDER — HYDROMORPHONE 1 MG/ML IV SOLN
INTRAVENOUS | Status: DC
  Administered 2016-03-01: 11.7 mg via INTRAVENOUS
  Administered 2016-03-01: 6.5 mg via INTRAVENOUS
  Administered 2016-03-02: 5 mg via INTRAVENOUS
  Administered 2016-03-02: 10:00:00 via INTRAVENOUS
  Administered 2016-03-02: 3.5 mg via INTRAVENOUS
  Administered 2016-03-02: 6.5 mg via INTRAVENOUS
  Administered 2016-03-02: 2 mg via INTRAVENOUS
  Administered 2016-03-03: 7 mg via INTRAVENOUS
  Administered 2016-03-03: 2.5 mg via INTRAVENOUS
  Administered 2016-03-03: 2 mg via INTRAVENOUS
  Administered 2016-03-03: 05:00:00 via INTRAVENOUS
  Administered 2016-03-03: 7.5 mg via INTRAVENOUS
  Administered 2016-03-03: 8.5 mg via INTRAVENOUS
  Filled 2016-03-01 (×2): qty 25

## 2016-03-01 MED ORDER — HYDROMORPHONE HCL 2 MG/ML IJ SOLN
2.0000 mg | INTRAMUSCULAR | Status: DC | PRN
  Administered 2016-03-01 – 2016-03-02 (×7): 2 mg via INTRAVENOUS
  Filled 2016-03-01 (×7): qty 1

## 2016-03-01 NOTE — Progress Notes (Signed)
SICKLE CELL SERVICE PROGRESS NOTE  [name] [id number] DOB: 06/14/1990 DOA: 02/26/2016 PCP: Angelica Chessman, MD  Assessment/Plan: Active Problems:   Hb-SS disease with crisis ([place])  1. Hb SS with crisis: Continue PCA at current dose and continue intermittent doses on a PRN basis. Decrease IVF to [facility]. 2. Leukocytosis: At 16K today,. The leukocytosis is likely due to sickle cell crisis. Will continue to monitor. 3. Anemia of chronic disease: Hb today is 6.3 which is an increase since yesterday. Will continue to hold on transfusion.  4. Chronic pain: Per office notes and [facility] review patient is prescribed MS Contin 30 mg BID. However she reports that she has been taking MS Contin 60 mg daily with the consent of her PMD. I will continue MS Contin 30 mg BID as prescribed.  5. Skin tear: Pt has a skin tear in the crevice of the buttocks wihtout any evidence of infection. Continue Desitin barrier cream to prevent excess moisture and maceration of the area, and leave area open to air.   Code Status: Full Code Family Communication: N/A Disposition Plan: Not yet ready for discharge  [place].  Pager [phone]. If 7PM-7AM, please contact night-coverage.  03/01/2016, 6:14 PM  LOS: 4 days   Interim History: Pt reports that her pain is 7/10 today and localized to her back down from 10/10. She has used 30 mg with 52/50:demands/deliveries in addition to 22 mg in clinician assisted doses in the last 24 hours.   Consultants:  None  Procedures:  None  Antibiotics:  None    Objective: Vitals:   03/01/16 0935 03/01/16 1325 03/01/16 1649 03/01/16 1750  BP: 129/80 121/60  128/84  Pulse: (!) 103 88  98  Resp: 14 15 13 16   Temp: 98.6 F (37 C) 98.4 F (36.9 C)  99 F (37.2 C)  TempSrc: Oral Oral  Oral  SpO2: 100% 99% 92% 95%  Weight:      Height:       Weight change:   Intake/Output Summary (Last 24 hours) at 03/01/16 1814 Last data filed at 03/01/16  1325  Gross per 24 hour  Intake              480 ml  Output                0 ml  Net              480 ml    General: Alert, awake, oriented x3, in no acute distress.  HEENT: [place]/AT PEERL, EOMI, anicteric Heart: Regular rate and rhythm, without murmurs, rubs, gallops, PMI non-displaced, no heaves or thrills on palpation.  Lungs: Clear to auscultation, no wheezing or rhonchi noted. No increased vocal fremitus resonant to percussion  Abdomen: Soft, nontender, nondistended, positive bowel sounds, no masses no hepatosplenomegaly noted.  Neuro: No focal neurological deficits noted cranial nerves II through XII grossly intact.  Strength at baseline in bilateral upper and lower extremities. Musculoskeletal: No warmth swelling or erythema around joints, no spinal tenderness noted. Psychiatric: Patient alert and oriented x3, good insight and cognition, good recent to remote recall. Skin: Small area of tear in the crevice of the buttocks. No erythema, warmth or redness. Unchanged form yesterday.   Data Reviewed: Basic Metabolic Panel:  Recent Labs Lab 02/26/16 1052 02/27/16 0025 02/29/16 0405  NA 140 137 140  K 4.0 3.7 4.0  CL 108 105 109  CO2 25 26 27   GLUCOSE 101* 113* 110*  BUN 14 10 7  CREATININE 0.89 0.95 0.85  CALCIUM 9.2 8.5* 8.3*   Liver Function Tests:  Recent Labs Lab 02/26/16 1052 02/29/16 0405  AST 51* 25  ALT 19 21  ALKPHOS 49 47  BILITOT 3.9* 2.7*  PROT 7.6 6.3*  ALBUMIN 4.6 3.6    Recent Labs Lab 02/26/16 1052  LIPASE 25   No results for input(s): AMMONIA in the last 168 hours. CBC:  Recent Labs Lab 02/26/16 1052 02/27/16 0025 02/29/16 0405 03/01/16 1030  WBC 34.9* 27.3* 14.2* 16.1*  NEUTROABS 31.8* 20.1* 8.1* 9.1*  HGB 7.4* 7.0* 5.9* 6.3*  HCT 20.3* 19.3* 16.7* 18.3*  MCV 81.5 81.4 84.3 88.4  PLT 359 451* 409* 474*   Cardiac Enzymes:  Recent Labs Lab 02/26/16 1859 02/27/16 0025 02/27/16 0627  TROPONINI <0.03 <0.03 <0.03   BNP (last 3  results) No results for input(s): BNP in the last 8760 hours.  ProBNP (last 3 results) No results for input(s): PROBNP in the last 8760 hours.  CBG: No results for input(s): GLUCAP in the last 168 hours.  No results found for this or any previous visit (from the past 240 hour(s)).   Studies: Dg Chest 2 View  Result Date: 02/26/2016 CLINICAL DATA:  Chest pain, sickle cell crisis. EXAM: CHEST  2 VIEW COMPARISON:  Radiographs of January 03, 2016. FINDINGS: The heart size and mediastinal contours are within normal limits. Both lungs are clear. No pneumothorax or pleural effusion is noted. Right internal jugular Port-A-Cath is unchanged in position. Skeletal changes are noted consistent with history of sickle cell disease. IMPRESSION: No active cardiopulmonary disease. Electronically Signed   By: Marijo Conception, M.D.   On: 02/26/2016 10:31   Ct Angio Chest Pe W And/or Wo Contrast  Result Date: 02/26/2016 CLINICAL DATA:  25 year old with sickle cell anemia. Patient complains of back and flank pain. EXAM: CT ANGIOGRAPHY CHEST CT ABDOMEN AND PELVIS WITH CONTRAST TECHNIQUE: Multidetector CT imaging of the chest was performed using the standard protocol during bolus administration of intravenous contrast. Multiplanar CT image reconstructions and MIPs were obtained to evaluate the vascular anatomy. Multidetector CT imaging of the abdomen and pelvis was performed using the standard protocol during bolus administration of intravenous contrast. CONTRAST:  130 mL Isovue 370 There was an issue with contrast leaking from the Port-A-Cath. As a result, contrast was given on 2 occasions. COMPARISON:  07/18/2012 and 11/30/2011 FINDINGS: CTA CHEST FINDINGS Cardiovascular: Study is not adequate to evaluate for pulmonary embolism. There is motion artifact and mixing of contrast and blood in the lobar and distal pulmonary arteries. Normal caliber of the thoracic aorta. Incidentally, the left vertebral artery originates  off of the arch which is a normal variant. Right thyroid lobe appears to mildly enlarged but poorly characterized. Central line tip extends into the right atrium region. Mediastinum/Nodes: Small amount of anterior mediastinal tissue is suggestive for residual thymus. Small lymph nodes in the prevascular space. Overall, there is not significant chest lymphadenopathy. No significant axillary lymphadenopathy. Lungs/Pleura: Trachea and mainstem bronchi are patent. Stable pleural-based nodule in the right upper lobe on sequence 17, image 16. Subtle nodular density in the right lower lobe on image 41 is essentially unchanged. There is a bilobed nodular structure in the superior segment of left lower lobe that measures roughly 6 mm and stable. This is seen on sequence 17, image 42. No large areas of airspace disease or consolidation. Musculoskeletal: Again noted is marked sclerosis in the left humeral head which could be related to avascular necrosis.  Review of the MIP images confirms the above findings. CT ABDOMEN and PELVIS FINDINGS Hepatobiliary: Gallbladder has been removed. Normal appearance of the liver without significant biliary dilatation. Portal venous system is patent. Pancreas: Normal appearance of the pancreas without inflammation or duct dilatation. Spleen: The spleen has diffuse nodularity and similar to the previous examination. Similar finding was noted on a CT from 11/30/2011. No significant splenic enlargement. Adrenals/Urinary Tract: Normal adrenal glands. Normal appearance of both kidneys without hydronephrosis. Urinary bladder is unremarkable. Limited evaluation for renal calculi because there is contrast in the renal collecting systems. Stomach/Bowel: Stomach is within normal limits. Appendix appears normal. No evidence of bowel wall thickening, distention, or inflammatory changes. Vascular/Lymphatic: No significant vascular findings are present. No enlarged abdominal or pelvic lymph nodes.  Reproductive: Uterus and bilateral adnexa are unremarkable. Other: No free fluid. No free air. Abdominal wall laxity near the umbilicus. There is a tiny umbilical hernia containing fat. Musculoskeletal: Changes in the vertebral body endplates compatible with sickle cell disease. Partially visualized lucency in the proximal right femur is incompletely evaluated. Patchy sclerosis in both femoral heads suggestive for AVN. Review of the MIP images confirms the above findings. IMPRESSION: Suboptimal evaluation for pulmonary emboli due to poor opacification of the pulmonary arteries as described. Pulmonary emboli cannot be excluded on this examination. No acute chest abnormality. Stable small pulmonary nodules that are probably postinflammatory. No acute abnormality in the abdomen or pelvis. Stable nodularity throughout the spleen. This nodularity has been present since 11/30/2011 and suggestive for a benign etiology. This is most likely associated with the sickle cell anemia. Diffuse bone changes compatible with sickle cell anemia. 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 Electronically Signed   By: Markus Daft M.D.   On: 02/26/2016 14:17   Ct Abdomen Pelvis W Contrast  Result Date: 02/26/2016 CLINICAL DATA:  25 year old with sickle cell anemia. Patient complains of back and flank pain. EXAM: CT ANGIOGRAPHY CHEST CT ABDOMEN AND PELVIS WITH CONTRAST TECHNIQUE: Multidetector CT imaging of the chest was performed using the standard protocol during bolus administration of intravenous contrast. Multiplanar CT image reconstructions and MIPs were obtained to evaluate the vascular anatomy. Multidetector CT imaging of the abdomen and pelvis was performed using the standard protocol during bolus administration of intravenous contrast. CONTRAST:  130 mL Isovue 370 There was an issue with contrast leaking from the Port-A-Cath. As a result, contrast was given on 2 occasions.  COMPARISON:  07/18/2012 and 11/30/2011 FINDINGS: CTA CHEST FINDINGS Cardiovascular: Study is not adequate to evaluate for pulmonary embolism. There is motion artifact and mixing of contrast and blood in the lobar and distal pulmonary arteries. Normal caliber of the thoracic aorta. Incidentally, the left vertebral artery originates off of the arch which is a normal variant. Right thyroid lobe appears to mildly enlarged but poorly characterized. Central line tip extends into the right atrium region. Mediastinum/Nodes: Small amount of anterior mediastinal tissue is suggestive for residual thymus. Small lymph nodes in the prevascular space. Overall, there is not significant chest lymphadenopathy. No significant axillary lymphadenopathy. Lungs/Pleura: Trachea and mainstem bronchi are patent. Stable pleural-based nodule in the right upper lobe on sequence 17, image 16. Subtle nodular density in the right lower lobe on image 41 is essentially unchanged. There is a bilobed nodular structure in the superior segment of left lower lobe that measures roughly 6 mm and stable. This is seen on sequence 17, image 42. No large areas of airspace disease or consolidation.  Musculoskeletal: Again noted is marked sclerosis in the left humeral head which could be related to avascular necrosis. Review of the MIP images confirms the above findings. CT ABDOMEN and PELVIS FINDINGS Hepatobiliary: Gallbladder has been removed. Normal appearance of the liver without significant biliary dilatation. Portal venous system is patent. Pancreas: Normal appearance of the pancreas without inflammation or duct dilatation. Spleen: The spleen has diffuse nodularity and similar to the previous examination. Similar finding was noted on a CT from 11/30/2011. No significant splenic enlargement. Adrenals/Urinary Tract: Normal adrenal glands. Normal appearance of both kidneys without hydronephrosis. Urinary bladder is unremarkable. Limited evaluation for renal  calculi because there is contrast in the renal collecting systems. Stomach/Bowel: Stomach is within normal limits. Appendix appears normal. No evidence of bowel wall thickening, distention, or inflammatory changes. Vascular/Lymphatic: No significant vascular findings are present. No enlarged abdominal or pelvic lymph nodes. Reproductive: Uterus and bilateral adnexa are unremarkable. Other: No free fluid. No free air. Abdominal wall laxity near the umbilicus. There is a tiny umbilical hernia containing fat. Musculoskeletal: Changes in the vertebral body endplates compatible with sickle cell disease. Partially visualized lucency in the proximal right femur is incompletely evaluated. Patchy sclerosis in both femoral heads suggestive for AVN. Review of the MIP images confirms the above findings. IMPRESSION: Suboptimal evaluation for pulmonary emboli due to poor opacification of the pulmonary arteries as described. Pulmonary emboli cannot be excluded on this examination. No acute chest abnormality. Stable small pulmonary nodules that are probably postinflammatory. No acute abnormality in the abdomen or pelvis. Stable nodularity throughout the spleen. This nodularity has been present since 11/30/2011 and suggestive for a benign etiology. This is most likely associated with the sickle cell anemia. Diffuse bone changes compatible with sickle cell anemia. 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 Electronically Signed   By: Markus Daft M.D.   On: 02/26/2016 14:17    Scheduled Meds: . DULoxetine  30 mg Oral Daily  . enoxaparin (LOVENOX) injection  40 mg Subcutaneous Q24H  . famotidine  20 mg Oral Q12H  . HYDROmorphone   Intravenous Q4H  . ketorolac  30 mg Intravenous Q6H  . morphine  30 mg Oral Q12H  . senna-docusate  1 tablet Oral BID  . sodium chloride flush  3 mL Intravenous Q12H   Continuous Infusions: . dextrose 5 % and 0.45% NaCl 10 mL/hr at 02/29/16 1229      In excess of 30 minutes spent during this visit. Greater than 50% involved face to face contact with the patient for assessment, counseling and coordination of care.

## 2016-03-01 NOTE — Care Management Important Message (Signed)
Important Message  Patient Details  Name: [name] MRN: [id number] Date of Birth: Jul 17, 1990   Medicare Important Message Given:  Yes    Camillo Flaming 03/01/2016, 10:40 AMImportant Message  Patient Details  Name: [name] MRN: [id number] Date of Birth: [birth year]   Medicare Important Message Given:  Yes    Camillo Flaming 03/01/2016, 10:40 AM

## 2016-03-01 NOTE — Care Management Note (Signed)
Case Management Note  Patient Details  Name: Nancy STADTLER MRN: [id number] Date of Birth: 1990/10/29  Subjective/Objective:            25 yo admitted with [facility]        Action/Plan: From home with family. Chart reviewed and CM following for DC needs.  Expected Discharge Date:   (unknown)               Expected Discharge Plan:  Home/Self Care  In-House Referral:     Discharge planning Services  CM Consult  Post Acute Care Choice:    Choice offered to:     DME Arranged:    DME Agency:     HH Arranged:    HH Agency:     Status of Service:  In process, will continue to follow  If discussed at Long Length of Stay Meetings, dates discussed:    Additional CommentsLynnell Catalan, RN 03/01/2016, 10:26 AM  [phone]

## 2016-03-02 DIAGNOSIS — Y92239 Unspecified place in hospital as the place of occurrence of the external cause: Secondary | ICD-10-CM

## 2016-03-02 DIAGNOSIS — D638 Anemia in other chronic diseases classified elsewhere: Secondary | ICD-10-CM | POA: Diagnosis not present

## 2016-03-02 DIAGNOSIS — D5700 Hb-SS disease with crisis, unspecified: Secondary | ICD-10-CM | POA: Diagnosis not present

## 2016-03-02 DIAGNOSIS — W19XXXA Unspecified fall, initial encounter: Secondary | ICD-10-CM | POA: Diagnosis not present

## 2016-03-02 MED ORDER — OXYCODONE HCL 5 MG PO TABS
15.0000 mg | ORAL_TABLET | ORAL | Status: DC
  Administered 2016-03-02 – 2016-03-03 (×8): 15 mg via ORAL
  Filled 2016-03-02 (×8): qty 3

## 2016-03-02 NOTE — Progress Notes (Addendum)
Patient called staff to room at 1230.  Patient stated that she had fallen as she was trying to go to the bathroom.  "I just woke up and was trying to get to the bathroom and slipped and fell."  Patient was found by Mariann Laster, NT sitting on the edge of the bed.  Pump was found on its side with IV fluid on the floor.  Patient complaining of increased back pain and left wrist pain.  In addition, patient IV pump was found to have the IV fluid rate set at 779ml/hr when orders were for 56ml/hr.  When patient was asked if she had manipulated pump she stated that she "didn't touch the pump".  Dr. Zigmund Daniel was notified and came to assess the patient.  Patient was educated that she would be in yellow socks, on the bed alarm, and that she would need to call for assist with ambulation.  Patient has no new orders.  Will continue to monitor patient.

## 2016-03-02 NOTE — Progress Notes (Addendum)
SICKLE CELL SERVICE PROGRESS NOTE  [name] [id number] DOB: May 22, 1990 DOA: 02/26/2016 PCP: Angelica Chessman, MD  Assessment/Plan: Active Problems:   Hb-SS disease with crisis ([name])  1. Hb SS with crisis: Schedule Oxycodone and continue PCA at current dose. Discontinue intermittent doses on a PRN basis. Decrease IVF to [facility]. 2. Leukocytosis: The leukocytosis is likely due to sickle cell crisis. Will continue to monitor. 3. Anemia of chronic Re-check Hb and reticulocytes tomorrow. Currently Hydrea on hold.  4. Chronic pain: Per office notes and [facility] review patient is prescribed MS Contin 30 mg BID. However she reports that she has been taking MS Contin 60 mg daily with the consent of her PMD. I will continue MS Contin 30 mg BID as prescribed.  5. Skin tear: Pt has a skin tear in the crevice of the buttocks wihtout any evidence of infection. Continue Desitin barrier cream to prevent excess moisture and maceration of the area, and leave area open to air.   Code Status: Full Code Family Communication: N/A Disposition Plan: Not yet ready for discharge  [place].  Pager [phone]. If 7PM-7AM, please contact night-coverage.  03/02/2016, 3:13 PM  LOS: 24 days   Interim History: Pt reports that her pain in back has improved however pain in  LUE  is 6/10 today. She has used 25.3 mg with 50/49:demands/deliveries in addition to 12 mg in clinician assisted doses in the last 24 hours.   Consultants:  None  Procedures:  None  Antibiotics:  None    Objective:  03/02/16 0627 03/02/16 0936 03/02/16 1010  BP: (!) 100/55 126/63   Pulse: 89 94   Resp:  14 10  Temp: 98.6 F (37 C) 98.7 F (37.1 C)   TempSrc: Oral Oral   SpO2: 96% 97% 100%  Weight:     Height:      Weight change:   Intake/Output Summary (Last 24 hours) at 03/02/16 1046 Last data filed at 03/02/16 0314  Gross per 24 hour  Intake              600 ml  Output                0 ml  Net               600 ml    General: Alert, awake, oriented x3, in no acute distress.  HEENT: [place]/AT PEERL, EOMI, anicteric Heart: Regular rate and rhythm, without murmurs, rubs, gallops, PMI non-displaced, no heaves or thrills on palpation.  Lungs: Clear to auscultation, no wheezing or rhonchi noted. No increased vocal fremitus resonant to percussion  Abdomen: Soft, nontender, nondistended, positive bowel sounds, no masses no hepatosplenomegaly noted.  Neuro: No focal neurological deficits noted cranial nerves II through XII grossly intact.  Strength at baseline in bilateral upper and lower extremities. Musculoskeletal: No warmth swelling or erythema around joints, no spinal tenderness noted. Psychiatric: Patient alert and oriented x3, good insight and cognition, good recent to remote recall. Skin: Small area of tear in the crevice of the buttocks. No erythema, warmth or redness. Unchanged form yesterday.   Data Reviewed: Basic Metabolic Panel:  Recent Labs Lab 02/26/16 1052 02/27/16 0025 02/29/16 0405  NA 140 137 140  K 4.0 3.7 4.0  CL 108 105 109  CO2 25 26 27   GLUCOSE 101* 113* 110*  BUN 14 10 7   CREATININE 0.89 0.95 0.85  CALCIUM 9.2 8.5* 8.3*   Liver Function Tests:  Recent Labs Lab 02/26/16 1052 02/29/16  0405  AST 51* 25  ALT 19 21  ALKPHOS 49 47  BILITOT 3.9* 2.7*  PROT 7.6 6.3*  ALBUMIN 4.6 3.6    Recent Labs Lab 02/26/16 1052  LIPASE 25   No results for input(s): AMMONIA in the last 168 hours. CBC:  Recent Labs Lab 02/26/16 1052 02/27/16 0025 02/29/16 0405 03/01/16 1030  WBC 34.9* 27.3* 14.2* 16.1*  NEUTROABS 31.8* 20.1* 8.1* 9.1*  HGB 7.4* 7.0* 5.9* 6.3*  HCT 20.3* 19.3* 16.7* 18.3*  MCV 81.5 81.4 84.3 88.4  PLT 359 451* 409* 474*   Cardiac Enzymes:  Recent Labs Lab 02/26/16 1859 02/27/16 0025 02/27/16 0627  TROPONINI <0.03 <0.03 <0.03   BNP (last 3 results) No results for input(s): BNP in the last 8760 hours.  ProBNP (last 3 results) No  results for input(s): PROBNP in the last 8760 hours.  CBG: No results for input(s): GLUCAP in the last 168 hours.  No results found for this or any previous visit (from the past 240 hour(s)).   Studies: Dg Chest 2 View  Result Date: 02/26/2016 CLINICAL DATA:  Chest pain, sickle cell crisis. EXAM: CHEST  2 VIEW COMPARISON:  Radiographs of January 03, 2016. FINDINGS: The heart size and mediastinal contours are within normal limits. Both lungs are clear. No pneumothorax or pleural effusion is noted. Right internal jugular Port-A-Cath is unchanged in position. Skeletal changes are noted consistent with history of sickle cell disease. IMPRESSION: No active cardiopulmonary disease. Electronically Signed   By: Marijo Conception, M.D.   On: 02/26/2016 10:31   Ct Angio Chest Pe W And/or Wo Contrast  Result Date: 02/26/2016 CLINICAL DATA:  25 year old with sickle cell anemia. Patient complains of back and flank pain. EXAM: CT ANGIOGRAPHY CHEST CT ABDOMEN AND PELVIS WITH CONTRAST TECHNIQUE: Multidetector CT imaging of the chest was performed using the standard protocol during bolus administration of intravenous contrast. Multiplanar CT image reconstructions and MIPs were obtained to evaluate the vascular anatomy. Multidetector CT imaging of the abdomen and pelvis was performed using the standard protocol during bolus administration of intravenous contrast. CONTRAST:  130 mL Isovue 370 There was an issue with contrast leaking from the Port-A-Cath. As a result, contrast was given on 2 occasions. COMPARISON:  07/18/2012 and 11/30/2011 FINDINGS: CTA CHEST FINDINGS Cardiovascular: Study is not adequate to evaluate for pulmonary embolism. There is motion artifact and mixing of contrast and blood in the lobar and distal pulmonary arteries. Normal caliber of the thoracic aorta. Incidentally, the left vertebral artery originates off of the arch which is a normal variant. Right thyroid lobe appears to mildly enlarged but  poorly characterized. Central line tip extends into the right atrium region. Mediastinum/Nodes: Small amount of anterior mediastinal tissue is suggestive for residual thymus. Small lymph nodes in the prevascular space. Overall, there is not significant chest lymphadenopathy. No significant axillary lymphadenopathy. Lungs/Pleura: Trachea and mainstem bronchi are patent. Stable pleural-based nodule in the right upper lobe on sequence 17, image 16. Subtle nodular density in the right lower lobe on image 41 is essentially unchanged. There is a bilobed nodular structure in the superior segment of left lower lobe that measures roughly 6 mm and stable. This is seen on sequence 17, image 42. No large areas of airspace disease or consolidation. Musculoskeletal: Again noted is marked sclerosis in the left humeral head which could be related to avascular necrosis. Review of the MIP images confirms the above findings. CT ABDOMEN and PELVIS FINDINGS Hepatobiliary: Gallbladder has been removed. Normal appearance  of the liver without significant biliary dilatation. Portal venous system is patent. Pancreas: Normal appearance of the pancreas without inflammation or duct dilatation. Spleen: The spleen has diffuse nodularity and similar to the previous examination. Similar finding was noted on a CT from 11/30/2011. No significant splenic enlargement. Adrenals/Urinary Tract: Normal adrenal glands. Normal appearance of both kidneys without hydronephrosis. Urinary bladder is unremarkable. Limited evaluation for renal calculi because there is contrast in the renal collecting systems. Stomach/Bowel: Stomach is within normal limits. Appendix appears normal. No evidence of bowel wall thickening, distention, or inflammatory changes. Vascular/Lymphatic: No significant vascular findings are present. No enlarged abdominal or pelvic lymph nodes. Reproductive: Uterus and bilateral adnexa are unremarkable. Other: No free fluid. No free air.  Abdominal wall laxity near the umbilicus. There is a tiny umbilical hernia containing fat. Musculoskeletal: Changes in the vertebral body endplates compatible with sickle cell disease. Partially visualized lucency in the proximal right femur is incompletely evaluated. Patchy sclerosis in both femoral heads suggestive for AVN. Review of the MIP images confirms the above findings. IMPRESSION: Suboptimal evaluation for pulmonary emboli due to poor opacification of the pulmonary arteries as described. Pulmonary emboli cannot be excluded on this examination. No acute chest abnormality. Stable small pulmonary nodules that are probably postinflammatory. No acute abnormality in the abdomen or pelvis. Stable nodularity throughout the spleen. This nodularity has been present since 11/30/2011 and suggestive for a benign etiology. This is most likely associated with the sickle cell anemia. Diffuse bone changes compatible with sickle cell anemia. 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 Electronically Signed   By: Markus Daft M.D.   On: 02/26/2016 14:17   Ct Abdomen Pelvis W Contrast  Result Date: 02/26/2016 CLINICAL DATA:  25 year old with sickle cell anemia. Patient complains of back and flank pain. EXAM: CT ANGIOGRAPHY CHEST CT ABDOMEN AND PELVIS WITH CONTRAST TECHNIQUE: Multidetector CT imaging of the chest was performed using the standard protocol during bolus administration of intravenous contrast. Multiplanar CT image reconstructions and MIPs were obtained to evaluate the vascular anatomy. Multidetector CT imaging of the abdomen and pelvis was performed using the standard protocol during bolus administration of intravenous contrast. CONTRAST:  130 mL Isovue 370 There was an issue with contrast leaking from the Port-A-Cath. As a result, contrast was given on 2 occasions. COMPARISON:  07/18/2012 and 11/30/2011 FINDINGS: CTA CHEST FINDINGS Cardiovascular: Study is not adequate  to evaluate for pulmonary embolism. There is motion artifact and mixing of contrast and blood in the lobar and distal pulmonary arteries. Normal caliber of the thoracic aorta. Incidentally, the left vertebral artery originates off of the arch which is a normal variant. Right thyroid lobe appears to mildly enlarged but poorly characterized. Central line tip extends into the right atrium region. Mediastinum/Nodes: Small amount of anterior mediastinal tissue is suggestive for residual thymus. Small lymph nodes in the prevascular space. Overall, there is not significant chest lymphadenopathy. No significant axillary lymphadenopathy. Lungs/Pleura: Trachea and mainstem bronchi are patent. Stable pleural-based nodule in the right upper lobe on sequence 17, image 16. Subtle nodular density in the right lower lobe on image 41 is essentially unchanged. There is a bilobed nodular structure in the superior segment of left lower lobe that measures roughly 6 mm and stable. This is seen on sequence 17, image 42. No large areas of airspace disease or consolidation. Musculoskeletal: Again noted is marked sclerosis in the left humeral head which could be related to avascular necrosis. Review of the  MIP images confirms the above findings. CT ABDOMEN and PELVIS FINDINGS Hepatobiliary: Gallbladder has been removed. Normal appearance of the liver without significant biliary dilatation. Portal venous system is patent. Pancreas: Normal appearance of the pancreas without inflammation or duct dilatation. Spleen: The spleen has diffuse nodularity and similar to the previous examination. Similar finding was noted on a CT from 11/30/2011. No significant splenic enlargement. Adrenals/Urinary Tract: Normal adrenal glands. Normal appearance of both kidneys without hydronephrosis. Urinary bladder is unremarkable. Limited evaluation for renal calculi because there is contrast in the renal collecting systems. Stomach/Bowel: Stomach is within normal  limits. Appendix appears normal. No evidence of bowel wall thickening, distention, or inflammatory changes. Vascular/Lymphatic: No significant vascular findings are present. No enlarged abdominal or pelvic lymph nodes. Reproductive: Uterus and bilateral adnexa are unremarkable. Other: No free fluid. No free air. Abdominal wall laxity near the umbilicus. There is a tiny umbilical hernia containing fat. Musculoskeletal: Changes in the vertebral body endplates compatible with sickle cell disease. Partially visualized lucency in the proximal right femur is incompletely evaluated. Patchy sclerosis in both femoral heads suggestive for AVN. Review of the MIP images confirms the above findings. IMPRESSION: Suboptimal evaluation for pulmonary emboli due to poor opacification of the pulmonary arteries as described. Pulmonary emboli cannot be excluded on this examination. No acute chest abnormality. Stable small pulmonary nodules that are probably postinflammatory. No acute abnormality in the abdomen or pelvis. Stable nodularity throughout the spleen. This nodularity has been present since 11/30/2011 and suggestive for a benign etiology. This is most likely associated with the sickle cell anemia. Diffuse bone changes compatible with sickle cell anemia. 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 Electronically Signed   By: Markus Daft M.D.   On: 02/26/2016 14:17    Scheduled Meds: . DULoxetine  30 mg Oral Daily  . enoxaparin (LOVENOX) injection  40 mg Subcutaneous Q24H  . famotidine  20 mg Oral Q12H  . HYDROmorphone   Intravenous Q4H  . morphine  30 mg Oral Q12H  . oxyCODONE  15 mg Oral Q4H  . senna-docusate  1 tablet Oral BID  . sodium chloride flush  3 mL Intravenous Q12H   Continuous Infusions: . dextrose 5 % and 0.45% NaCl 10 mL/hr at 03/02/16 0357     In excess of 30 minutes spent during this visit. Greater than 50% involved face to face contact with the patient  for assessment, counseling and coordination of care.   Shiven Junious A.    Addendum: Received page from nurse to the effect that patient had an unwitnessed fall and was found on the bed by staff after getting up off the floor. Patient reports that she slipped in her shoes while going to the bathroom and fell on her buttocks and left wrist.   Back examined and not deformity or tenderness noted in the vertebral region. She does have some tenderness in the area of the left iliac crest abut patient has normal mobility. Pt also has pain and tenderness in the left wrist but no swelling or deformity noted and patient has full AROM.   Will re-examine and re-evaluate tomorrow. Continue current treatment.  Tiondra Fang A.

## 2016-03-03 DIAGNOSIS — R112 Nausea with vomiting, unspecified: Secondary | ICD-10-CM | POA: Diagnosis not present

## 2016-03-03 DIAGNOSIS — W19XXXA Unspecified fall, initial encounter: Secondary | ICD-10-CM | POA: Diagnosis not present

## 2016-03-03 DIAGNOSIS — R1110 Vomiting, unspecified: Secondary | ICD-10-CM | POA: Diagnosis not present

## 2016-03-03 DIAGNOSIS — D5700 Hb-SS disease with crisis, unspecified: Secondary | ICD-10-CM | POA: Diagnosis not present

## 2016-03-03 DIAGNOSIS — F339 Major depressive disorder, recurrent, unspecified: Secondary | ICD-10-CM

## 2016-03-03 DIAGNOSIS — R55 Syncope and collapse: Secondary | ICD-10-CM

## 2016-03-03 DIAGNOSIS — T671XXA Heat syncope, initial encounter: Secondary | ICD-10-CM | POA: Diagnosis not present

## 2016-03-03 DIAGNOSIS — D638 Anemia in other chronic diseases classified elsewhere: Secondary | ICD-10-CM | POA: Diagnosis not present

## 2016-03-03 DIAGNOSIS — S31801A Laceration without foreign body of unspecified buttock, initial encounter: Secondary | ICD-10-CM | POA: Diagnosis not present

## 2016-03-03 DIAGNOSIS — F329 Major depressive disorder, single episode, unspecified: Secondary | ICD-10-CM | POA: Diagnosis not present

## 2016-03-03 LAB — CBC WITH DIFFERENTIAL/PLATELET
BASOS PCT: 0 %
Basophils Absolute: 0 10*3/uL (ref 0.0–0.1)
EOS PCT: 7 %
Eosinophils Absolute: 0.9 10*3/uL — ABNORMAL HIGH (ref 0.0–0.7)
HEMATOCRIT: 21.4 % — AB (ref 36.0–46.0)
HEMOGLOBIN: 7.2 g/dL — AB (ref 12.0–15.0)
LYMPHS ABS: 2.2 10*3/uL (ref 0.7–4.0)
Lymphocytes Relative: 18 %
MCH: 31.6 pg (ref 26.0–34.0)
MCHC: 33.6 g/dL (ref 30.0–36.0)
MCV: 93.9 fL (ref 78.0–100.0)
Monocytes Absolute: 1.5 10*3/uL — ABNORMAL HIGH (ref 0.1–1.0)
Monocytes Relative: 12 %
NEUTROS ABS: 7.7 10*3/uL (ref 1.7–7.7)
Neutrophils Relative %: 63 %
Platelets: 482 10*3/uL — ABNORMAL HIGH (ref 150–400)
RBC: 2.28 MIL/uL — ABNORMAL LOW (ref 3.87–5.11)
RDW: 26.3 % — ABNORMAL HIGH (ref 11.5–15.5)
WBC: 12.3 10*3/uL — ABNORMAL HIGH (ref 4.0–10.5)
nRBC: 57 /100 WBC — ABNORMAL HIGH

## 2016-03-03 LAB — RETICULOCYTES
RBC.: 2.28 MIL/uL — AB (ref 3.87–5.11)
Retic Ct Pct: >23 % — ABNORMAL HIGH (ref 0.4–3.1)

## 2016-03-03 MED ORDER — DULOXETINE HCL 30 MG PO CPEP: 30.0000 mg | ORAL_CAPSULE | Freq: Every day | ORAL | 0 refills | Status: DC

## 2016-03-03 MED ORDER — HEPARIN SOD (PORK) LOCK FLUSH 100 UNIT/ML IV SOLN
500.0000 [IU] | INTRAVENOUS | Status: DC | PRN
  Filled 2016-03-03: qty 5

## 2016-03-03 NOTE — Progress Notes (Signed)
Nursing Discharge Summary  Patient ID: [name] MRN: [id number] DOB/AGE: 07-30-1990 25 y.o.  Admit date: 02/26/2016 Discharge date: 03/03/2016  Discharged Condition: good  Disposition: 01-Home or Self Care  Follow-up Information    Angelica Chessman, MD Follow up on 03/29/2016.   Specialty:  Internal Medicine Why:  10:00am Contact information: [address] [place] [address] [phone]           Prescriptions Given: Prescription for Cymbalta called into pharmacy.  Follow up appointments and medications discussed.  Patient verbalized understanding without further questions.  Means of Discharge: Patient to be taken downstairs via wheelchair to be discharged home.   Signed: Buel Ream 03/03/2016, 5:41 PM

## 2016-03-03 NOTE — Discharge Summary (Signed)
[name] MRN: [id number] DOB/AGE: 12-28-1990 25 y.o.  Admit date: 02/26/2016 Discharge date: 03/03/2016  Primary Care Physician:  Angelica Chessman, MD   Discharge Diagnoses:   Patient Active Problem List   Diagnosis Date Noted  . Major depression, chronic 01/06/2011    Priority: High  . Sickle cell disease ([place]) 01/08/2009    Priority: High  . Fall during current hospitalization   . Sickle cell anemia with pain ([place]) 02/16/2016  . Chronic back pain   . Sickle cell anemia with crisis ([place]) 01/05/2016  . Nausea & vomiting 12/27/2015  . Sickle cell crisis ([place]) 11/09/2015  . Hyperbilirubinemia 11/09/2015  . Sickle cell pain crisis ([place]) 10/26/2015  . Nausea and vomiting 09/07/2015  . Chronic pain 08/04/2015  . Anemia of chronic disease   . 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 Trichotillomania 01/08/2009    DISCHARGE MEDICATION:   Medication List    TAKE these medications   aspirin-acetaminophen-caffeine 250-250-65 MG tablet Commonly known as:  EXCEDRIN MIGRAINE Take 2 tablets by mouth every 6 (six) hours as needed for headache.   cyclobenzaprine 10 MG tablet Commonly known as:  FLEXERIL Take 1 tablet (10 mg total) by mouth 3 (three) times daily as needed for muscle spasms.   DULoxetine 30 MG capsule Commonly known as:  CYMBALTA Take 1 capsule (30 mg total) by mouth daily. Start taking on:  03/04/2016   hydroxyurea 500 MG capsule Commonly known as:  HYDREA Take 2 capsules (1,000 mg total) by mouth daily. May take with food to minimize GI side effects.   ibuprofen 600 MG tablet Commonly known as:  ADVIL,MOTRIN Take 600 mg by mouth every 6 (six) hours as needed for mild pain or moderate pain.   morphine 30 MG 12 hr tablet Commonly known as:  MS CONTIN Take 1 tablet (30 mg total) by mouth every 12 (twelve) hours.   oxyCODONE 15 MG immediate release tablet Commonly known as:  ROXICODONE Take 1 tablet (15 mg total) by mouth every 4 (four)  hours as needed for pain.         Consults:    SIGNIFICANT DIAGNOSTIC STUDIES:  Dg Chest 2 View  Result Date: 02/26/2016 CLINICAL DATA:  Chest pain, sickle cell crisis. EXAM: CHEST  2 VIEW COMPARISON:  Radiographs of January 03, 2016. FINDINGS: The heart size and mediastinal contours are within normal limits. Both lungs are clear. No pneumothorax or pleural effusion is noted. Right internal jugular Port-A-Cath is unchanged in position. Skeletal changes are noted consistent with history of sickle cell disease. IMPRESSION: No active cardiopulmonary disease. Electronically Signed   By: Marijo Conception, M.D.   On: 02/26/2016 10:31   Ct Angio Chest Pe W And/or Wo Contrast  Result Date: 02/26/2016 CLINICAL DATA:  25 year old with sickle cell anemia. Patient complains of back and flank pain. EXAM: CT ANGIOGRAPHY CHEST CT ABDOMEN AND PELVIS WITH CONTRAST TECHNIQUE: Multidetector CT imaging of the chest was performed using the standard protocol during bolus administration of intravenous contrast. Multiplanar CT image reconstructions and MIPs were obtained to evaluate the vascular anatomy. Multidetector CT imaging of the abdomen and pelvis was performed using the standard protocol during bolus administration of intravenous contrast. CONTRAST:  130 mL Isovue 370 There was an issue with contrast leaking from the Port-A-Cath. As a result, contrast was given on 2 occasions. COMPARISON:  07/18/2012 and 11/30/2011 FINDINGS: CTA CHEST FINDINGS Cardiovascular: Study is not adequate to evaluate for pulmonary embolism. There is motion artifact and mixing  of contrast and blood in the lobar and distal pulmonary arteries. Normal caliber of the thoracic aorta. Incidentally, the left vertebral artery originates off of the arch which is a normal variant. Right thyroid lobe appears to mildly enlarged but poorly characterized. Central line tip extends into the right atrium region. Mediastinum/Nodes: Small amount of anterior  mediastinal tissue is suggestive for residual thymus. Small lymph nodes in the prevascular space. Overall, there is not significant chest lymphadenopathy. No significant axillary lymphadenopathy. Lungs/Pleura: Trachea and mainstem bronchi are patent. Stable pleural-based nodule in the right upper lobe on sequence 17, image 16. Subtle nodular density in the right lower lobe on image 41 is essentially unchanged. There is a bilobed nodular structure in the superior segment of left lower lobe that measures roughly 6 mm and stable. This is seen on sequence 17, image 42. No large areas of airspace disease or consolidation. Musculoskeletal: Again noted is marked sclerosis in the left humeral head which could be related to avascular necrosis. Review of the MIP images confirms the above findings. CT ABDOMEN and PELVIS FINDINGS Hepatobiliary: Gallbladder has been removed. Normal appearance of the liver without significant biliary dilatation. Portal venous system is patent. Pancreas: Normal appearance of the pancreas without inflammation or duct dilatation. Spleen: The spleen has diffuse nodularity and similar to the previous examination. Similar finding was noted on a CT from 11/30/2011. No significant splenic enlargement. Adrenals/Urinary Tract: Normal adrenal glands. Normal appearance of both kidneys without hydronephrosis. Urinary bladder is unremarkable. Limited evaluation for renal calculi because there is contrast in the renal collecting systems. Stomach/Bowel: Stomach is within normal limits. Appendix appears normal. No evidence of bowel wall thickening, distention, or inflammatory changes. Vascular/Lymphatic: No significant vascular findings are present. No enlarged abdominal or pelvic lymph nodes. Reproductive: Uterus and bilateral adnexa are unremarkable. Other: No free fluid. No free air. Abdominal wall laxity near the umbilicus. There is a tiny umbilical hernia containing fat. Musculoskeletal: Changes in the  vertebral body endplates compatible with sickle cell disease. Partially visualized lucency in the proximal right femur is incompletely evaluated. Patchy sclerosis in both femoral heads suggestive for AVN. Review of the MIP images confirms the above findings. IMPRESSION: Suboptimal evaluation for pulmonary emboli due to poor opacification of the pulmonary arteries as described. Pulmonary emboli cannot be excluded on this examination. No acute chest abnormality. Stable small pulmonary nodules that are probably postinflammatory. No acute abnormality in the abdomen or pelvis. Stable nodularity throughout the spleen. This nodularity has been present since 11/30/2011 and suggestive for a benign etiology. This is most likely associated with the sickle cell anemia. Diffuse bone changes compatible with sickle cell anemia. 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 Electronically Signed   By: Markus Daft M.D.   On: 02/26/2016 14:17   Ct Abdomen Pelvis W Contrast  Result Date: 02/26/2016 CLINICAL DATA:  25 year old with sickle cell anemia. Patient complains of back and flank pain. EXAM: CT ANGIOGRAPHY CHEST CT ABDOMEN AND PELVIS WITH CONTRAST TECHNIQUE: Multidetector CT imaging of the chest was performed using the standard protocol during bolus administration of intravenous contrast. Multiplanar CT image reconstructions and MIPs were obtained to evaluate the vascular anatomy. Multidetector CT imaging of the abdomen and pelvis was performed using the standard protocol during bolus administration of intravenous contrast. CONTRAST:  130 mL Isovue 370 There was an issue with contrast leaking from the Port-A-Cath. As a result, contrast was given on 2 occasions. COMPARISON:  07/18/2012 and 11/30/2011 FINDINGS: CTA  CHEST FINDINGS Cardiovascular: Study is not adequate to evaluate for pulmonary embolism. There is motion artifact and mixing of contrast and blood in the lobar and distal  pulmonary arteries. Normal caliber of the thoracic aorta. Incidentally, the left vertebral artery originates off of the arch which is a normal variant. Right thyroid lobe appears to mildly enlarged but poorly characterized. Central line tip extends into the right atrium region. Mediastinum/Nodes: Small amount of anterior mediastinal tissue is suggestive for residual thymus. Small lymph nodes in the prevascular space. Overall, there is not significant chest lymphadenopathy. No significant axillary lymphadenopathy. Lungs/Pleura: Trachea and mainstem bronchi are patent. Stable pleural-based nodule in the right upper lobe on sequence 17, image 16. Subtle nodular density in the right lower lobe on image 41 is essentially unchanged. There is a bilobed nodular structure in the superior segment of left lower lobe that measures roughly 6 mm and stable. This is seen on sequence 17, image 42. No large areas of airspace disease or consolidation. Musculoskeletal: Again noted is marked sclerosis in the left humeral head which could be related to avascular necrosis. Review of the MIP images confirms the above findings. CT ABDOMEN and PELVIS FINDINGS Hepatobiliary: Gallbladder has been removed. Normal appearance of the liver without significant biliary dilatation. Portal venous system is patent. Pancreas: Normal appearance of the pancreas without inflammation or duct dilatation. Spleen: The spleen has diffuse nodularity and similar to the previous examination. Similar finding was noted on a CT from 11/30/2011. No significant splenic enlargement. Adrenals/Urinary Tract: Normal adrenal glands. Normal appearance of both kidneys without hydronephrosis. Urinary bladder is unremarkable. Limited evaluation for renal calculi because there is contrast in the renal collecting systems. Stomach/Bowel: Stomach is within normal limits. Appendix appears normal. No evidence of bowel wall thickening, distention, or inflammatory changes.  Vascular/Lymphatic: No significant vascular findings are present. No enlarged abdominal or pelvic lymph nodes. Reproductive: Uterus and bilateral adnexa are unremarkable. Other: No free fluid. No free air. Abdominal wall laxity near the umbilicus. There is a tiny umbilical hernia containing fat. Musculoskeletal: Changes in the vertebral body endplates compatible with sickle cell disease. Partially visualized lucency in the proximal right femur is incompletely evaluated. Patchy sclerosis in both femoral heads suggestive for AVN. Review of the MIP images confirms the above findings. IMPRESSION: Suboptimal evaluation for pulmonary emboli due to poor opacification of the pulmonary arteries as described. Pulmonary emboli cannot be excluded on this examination. No acute chest abnormality. Stable small pulmonary nodules that are probably postinflammatory. No acute abnormality in the abdomen or pelvis. Stable nodularity throughout the spleen. This nodularity has been present since 11/30/2011 and suggestive for a benign etiology. This is most likely associated with the sickle cell anemia. Diffuse bone changes compatible with sickle cell anemia. 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 Electronically Signed   By: Markus Daft M.D.   On: 02/26/2016 14:17      No results found for this or any previous visit (from the past 240 hour(s)).  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 She tried to manage her pain with oral analgesics and was doing well until yesterday when she started having emesis and was unable to keep down her medications. She continued to vomit until her arrival to the ED and this improved  after she received anti-emetics and IVF. Her pain escalated to 10/10 when she started having emesis and  she reports that she has been unable to get any sleep in the last 24 hours. She also reports an episode of syncope while folding clothes for her kids. She denies dizziness or palpitations prior to syncopal episode but does report being "hot and sweaty". Her emesis  Started after the syncopal episode. She states that her pain is presently at 10 /10 and the dose of medication received in the ED has not affected her pain. She denies any other associated symptoms: fever, chills, cough, diarrhea.  In the ED she received 2 doses of Dilaudid 1 mg  and dose of Toradol and her pain is still 10/10. I am asked to admit her for sickle cell Crisis and syncope.   Hospital Course:  Present on Admission: . Hb-SS disease with crisis [facility]): Pt was managed with Dilaudid via PCA, Toradol and IVF. As the pain resolved she was transitioned to oral analgesics. She is discharged home on pre-hospital regimen.  . Anemia of Chronic Disease: Hb remain ed stable except for dilutional effect which corrected after fluids were discontinued. Hydrea was held for 1 day bur resumed and patient is discharged home on Hydrea 1000 mg daily. . Fall: Pt sustained a fall when getting out of bed. She had no discernable tissue injury and was able to ambulate without difficulty and perform ADL's without pain.  Leukocytosis: Pt had a markedly elevated WBC count on admission. That was all attributed to her sickle cell crisis. With treatment of the crisis and hydration, the WBC count decreased and was 12.3K at the time of discharge. . Syncope: Pt reported a syncopal episode which fit the description of heat syncope. She had no focal neurological deficits and was admitted to a telemetry unit. She demonstrated no cardiac electrical activity and had no further syncopal episodes.  . Emesis: Pt reported emesis pre-admission. However after hydration the emesis resolved and she had no further emesis during the hospitalization.  . Chronic Depression:  Continue Cymbalta.   Disposition and Follow-up:  Pt discharged home in good condition and is to follow up with her PMD as scheduled.   Discharge Instructions    Activity as tolerated - No restrictions    Complete by:  As directed    Diet general    Complete by:  As directed       DISCHARGE EXAM:  General: Alert, awake, oriented x3, in no apparent distress.  HEENT: [place]/AT PEERL, EOMI, anicteric Neck: Trachea midline, no masses, no thyromegal,y no JVD, no carotid bruit OROPHARYNX: Moist, No exudate/ erythema/lesions.  Heart: Regular rate and rhythm, without murmurs, rubs, gallops or S3. PMI non-displaced. Exam reveals no decreased pulses. Pulmonary/Chest: Normal effort. Breath sounds normal. No. Apnea. Clear to auscultation,no stridor,  no wheezing and no rhonchi noted. No respiratory distress and no tenderness noted. Abdomen: Soft, nontender, nondistended, normal bowel sounds, no masses no hepatosplenomegaly noted. No fluid wave and no ascites. There is no guarding or rebound. Neuro: Alert and oriented to person, place and time. Normal motor skills, Displays no atrophy or tremors and exhibits normal muscle tone.  No focal neurological deficits noted cranial nerves II through XII grossly intact. No sensory deficit noted. Strength at baseline in bilateral upper and lower extremities. Gait normal. Musculoskeletal: No warmth swelling or erythema around joints, no spinal tenderness noted. Psychiatric: Patient alert and oriented x3, good insight and cognition, Mood, memory, affect and judgement normal. Lymph  node survey: No cervical axillary or inguinal lymphadenopathy noted. Skin: Skin is warm and dry. No bruising, no ecchymosis and no rash noted. Pt is not diaphoretic. No erythema. No pallor   Blood pressure 113/60, pulse 90, temperature 98.1 F (36.7 C), temperature source Oral, resp. rate 16, height 5\' 2"  (1.575 m), weight 74 kg (163 lb 2.3 oz), last menstrual period 12/26/2015, SpO2 98  %, not currently breastfeeding.  No results for input(s): NA, K, CL, CO2, GLUCOSE, BUN, CREATININE, CALCIUM, MG, PHOS in the last 72 hours. No results for input(s): AST, ALT, ALKPHOS, BILITOT, PROT, ALBUMIN in the last 72 hours. No results for input(s): LIPASE, AMYLASE in the last 72 hours.  Recent Labs  03/01/16 1030 03/03/16 1145  WBC 16.1* 12.3*  NEUTROABS 9.1* 7.7  HGB 6.3* 7.2*  HCT 18.3* 21.4*  MCV 88.4 93.9  PLT 474* 482*     Total time spent including face to face and decision making was greater than 30 minutes  Signed: Quintavious Rinck A. 03/03/2016, 4:59 PM

## 2016-03-07 ENCOUNTER — Telehealth: Payer: Self-pay

## 2016-03-07 ENCOUNTER — Other Ambulatory Visit: Payer: Self-pay | Admitting: Internal Medicine

## 2016-03-07 DIAGNOSIS — D571 Sickle-cell disease without crisis: Secondary | ICD-10-CM

## 2016-03-07 MED ORDER — MORPHINE SULFATE ER 30 MG PO TBCR: 30.0000 mg | EXTENDED_RELEASE_TABLET | Freq: Two times a day (BID) | ORAL | 0 refills | Status: DC

## 2016-03-07 MED ORDER — OXYCODONE HCL 15 MG PO TABS: 15.0000 mg | ORAL_TABLET | ORAL | 0 refills | Status: DC | PRN

## 2016-03-08 MED FILL — MORPHINE SULF 30 MG TAB SA: 30 | 30 days supply | Qty: 60 | Fill #0

## 2016-03-14 ENCOUNTER — Other Ambulatory Visit: Payer: Self-pay | Admitting: Internal Medicine

## 2016-03-14 ENCOUNTER — Telehealth: Payer: Self-pay

## 2016-03-14 MED ORDER — HYDROXYUREA 500 MG PO CAPS: 1000.0000 mg | ORAL_CAPSULE | Freq: Every day | ORAL | 3 refills | Status: DC

## 2016-03-14 MED ORDER — IBUPROFEN 600 MG PO TABS: 600.0000 mg | ORAL_TABLET | Freq: Four times a day (QID) | ORAL | 1 refills | Status: DC | PRN

## 2016-03-16 IMAGING — US US OB COMP LESS 14 WK
1 series · 14 of 24 positions shown · non-contrast
Comparison: none

CLINICAL DATA: 24-year-old pregnant female with pelvic pain and
vomiting for 1 day. Uncertain dates.

EXAM:
LIMITED OBSTETRIC ULTRASOUND

[Series 1: us ob comp less 14 wk · 0.23mm/px · 14 of 24 slices shown]
[im 1/24]
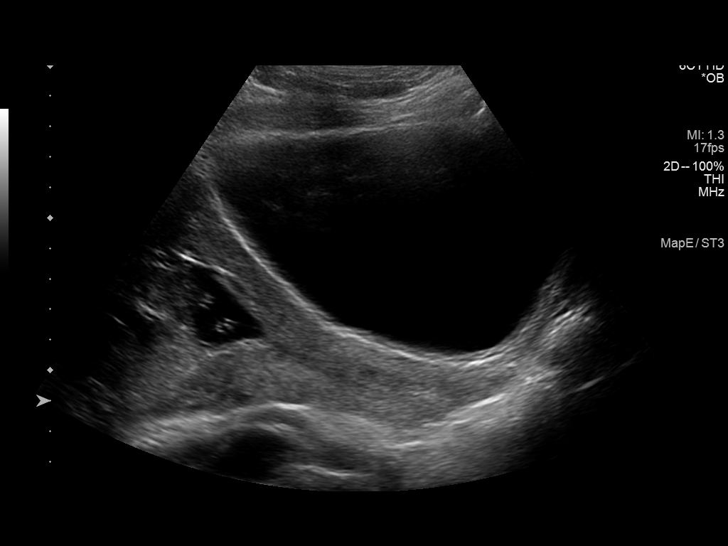
[im 3/24]
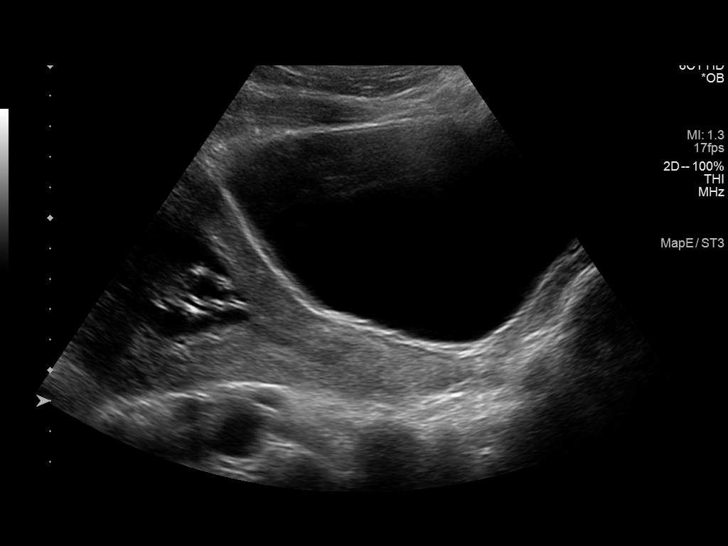
[im 5/24]
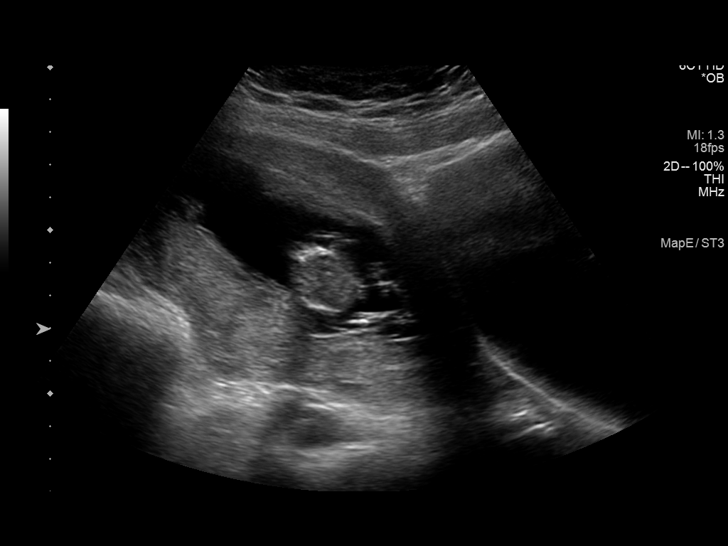
[im 7/24]
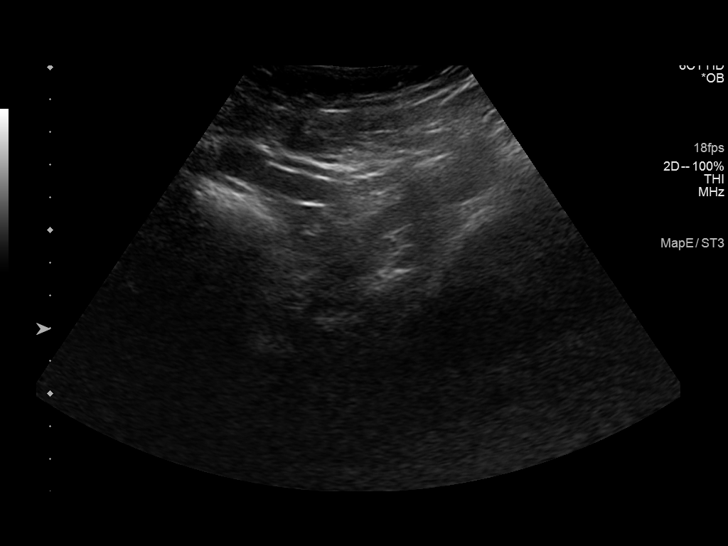
[im 8/24]
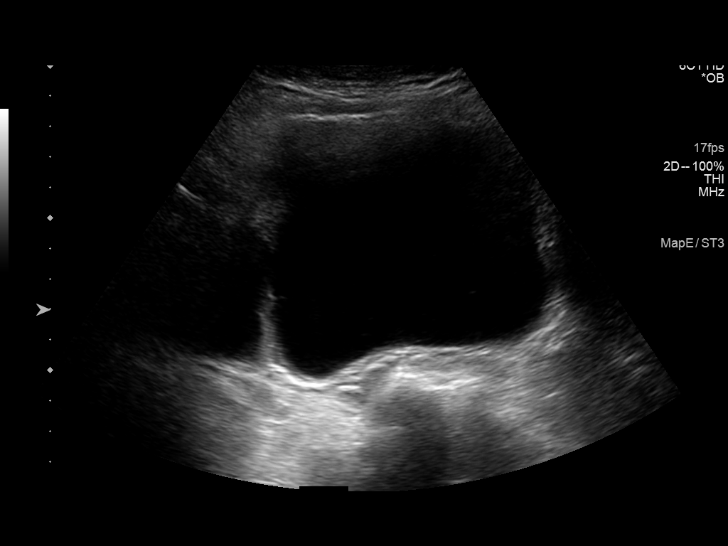
[im 10/24]
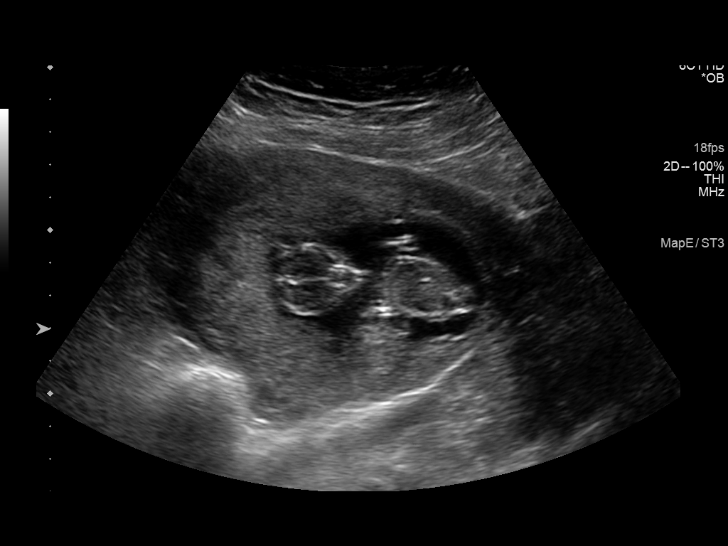
[im 12/24]
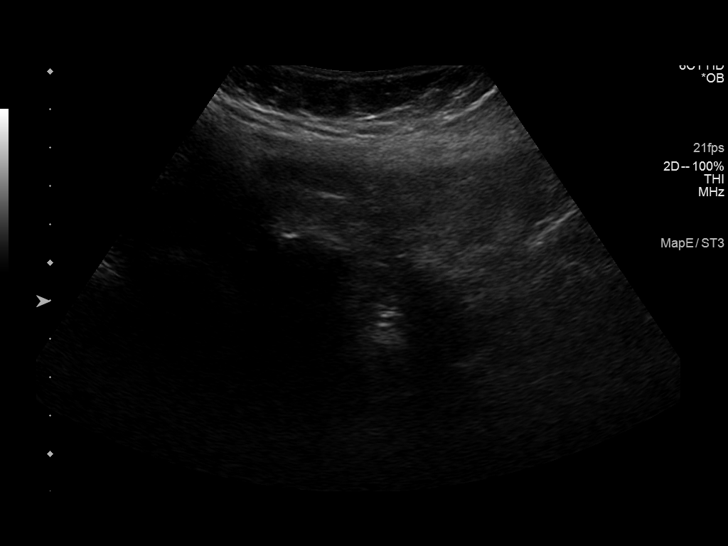
[im 13/24]
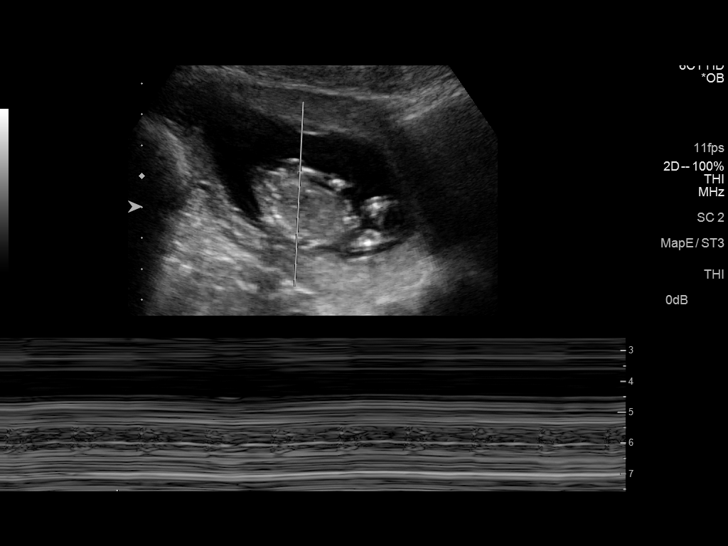
[im 15/24]
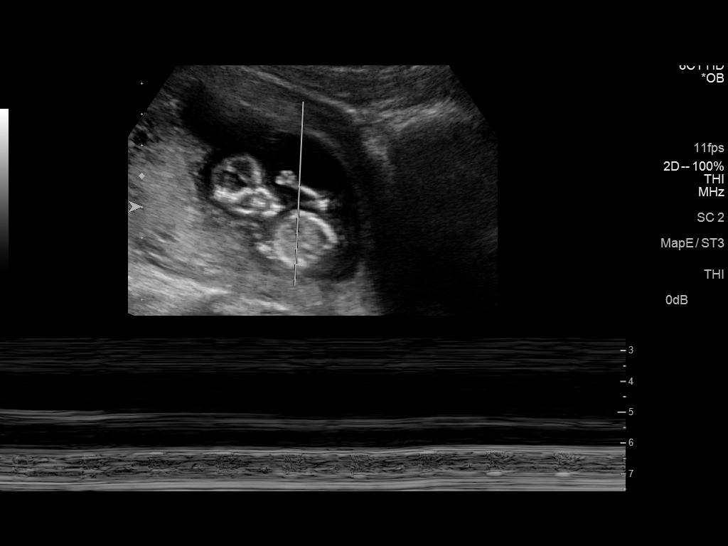
[im 17/24]
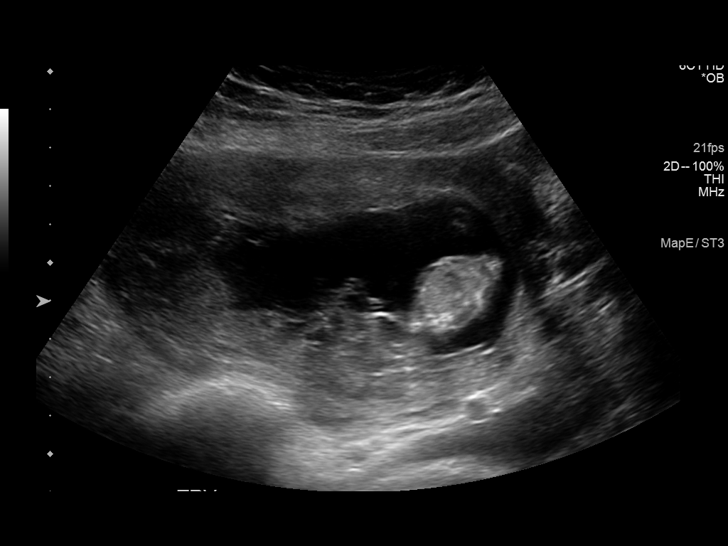
[im 19/24]
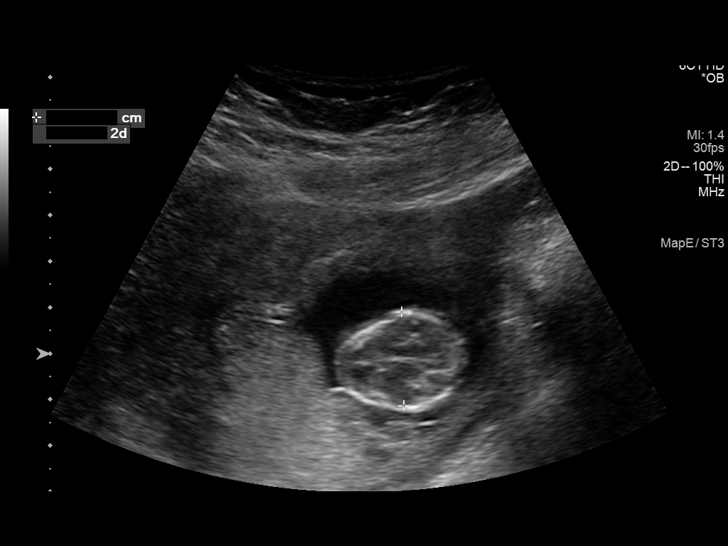
[im 20/24]
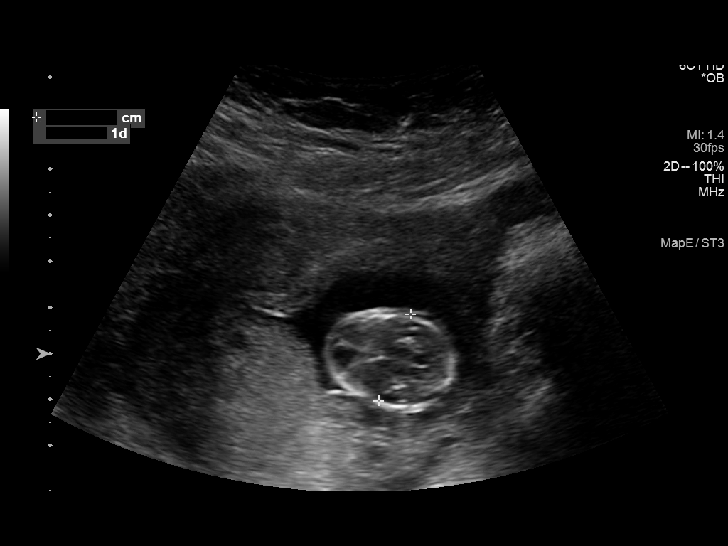
[im 22/24]
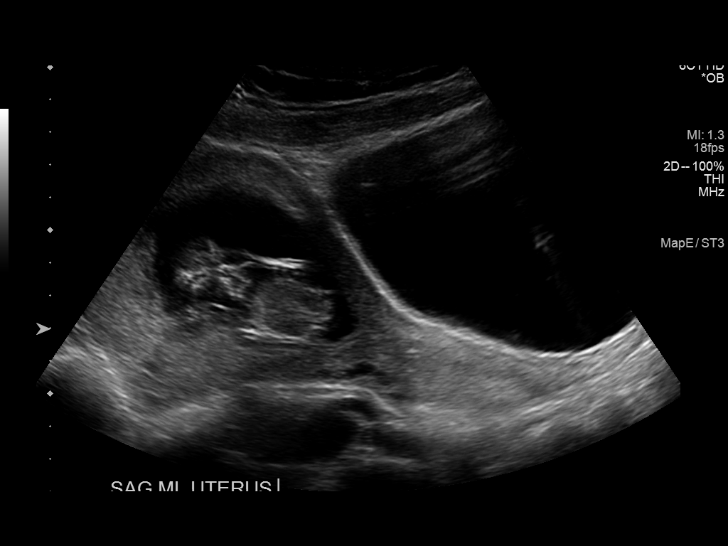
[im 24/24]
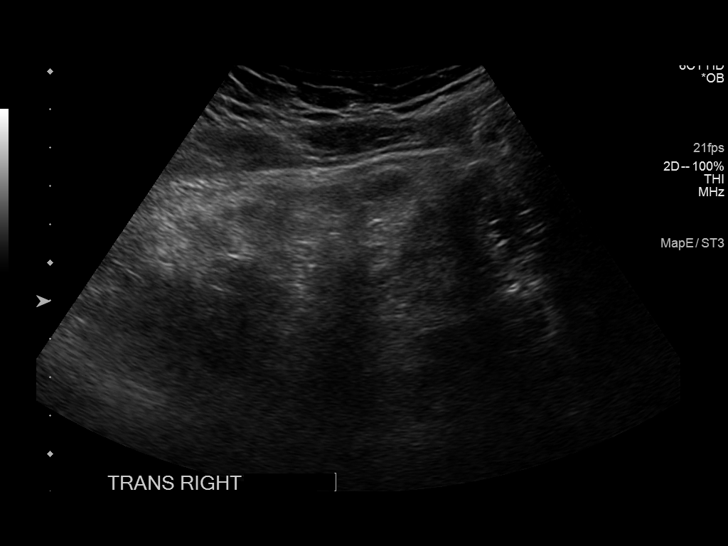

[14 of 24 positions shown; findings below may reference images not displayed]

FINDINGS: Number of Fetuses: 1

Heart Rate:  150 bpm

Movement: Yes

Presentation: Transverse head maternal right

Placental Location: Posterior

Previa: No

Amniotic Fluid (Subjective):  Within normal limits.

BPD:  2.01cm 13w  1d

MATERNAL FINDINGS:

Cervix:  Appears closed.

Uterus/Adnexae: Ovaries not visualized. No subchorionic hemorrhage,
free fluid or adnexal mass.
IMPRESSION: Single living intrauterine gestation with estimated gestational age
of 13 weeks 1 day by this ultrasound.

No acute abnormalities identified.

This exam is performed on an emergent basis and does not
comprehensively evaluate fetal size, dating, or anatomy; follow-up
complete OB US should be considered if further fetal assessment is
warranted.

## 2016-03-18 ENCOUNTER — Telehealth: Payer: Self-pay

## 2016-03-18 ENCOUNTER — Other Ambulatory Visit: Payer: Self-pay | Admitting: Internal Medicine

## 2016-03-19 ENCOUNTER — Emergency Department (HOSPITAL_COMMUNITY)
Admission: EM | Admit: 2016-03-19 | Discharge: 2016-03-19 | Disposition: A | Discharge status: Home / Self Care | Payer: Medicare Other | Attending: Emergency Medicine | Admitting: Emergency Medicine

## 2016-03-19 ENCOUNTER — Encounter (HOSPITAL_COMMUNITY): Payer: Self-pay | Admitting: Oncology

## 2016-03-19 DIAGNOSIS — Z91040 Latex allergy status: Secondary | ICD-10-CM | POA: Insufficient documentation

## 2016-03-19 DIAGNOSIS — D5700 Hb-SS disease with crisis, unspecified: Secondary | ICD-10-CM | POA: Insufficient documentation

## 2016-03-19 DIAGNOSIS — Z87891 Personal history of nicotine dependence: Secondary | ICD-10-CM | POA: Diagnosis not present

## 2016-03-19 DIAGNOSIS — D571 Sickle-cell disease without crisis: Secondary | ICD-10-CM | POA: Diagnosis not present

## 2016-03-19 DIAGNOSIS — Z7982 Long term (current) use of aspirin: Secondary | ICD-10-CM | POA: Diagnosis not present

## 2016-03-19 LAB — CBC WITH DIFFERENTIAL/PLATELET
BASOS PCT: 1 %
Basophils Absolute: 0.1 10*3/uL (ref 0.0–0.1)
Eosinophils Absolute: 1 10*3/uL — ABNORMAL HIGH (ref 0.0–0.7)
Eosinophils Relative: 7 %
HEMATOCRIT: 22.3 % — AB (ref 36.0–46.0)
Hemoglobin: 7.8 g/dL — ABNORMAL LOW (ref 12.0–15.0)
LYMPHS PCT: 27 %
Lymphs Abs: 4.1 10*3/uL — ABNORMAL HIGH (ref 0.7–4.0)
MCH: 30.5 pg (ref 26.0–34.0)
MCHC: 35 g/dL (ref 30.0–36.0)
MCV: 87.1 fL (ref 78.0–100.0)
MONO ABS: 1.4 10*3/uL — AB (ref 0.1–1.0)
MONOS PCT: 9 %
NEUTROS ABS: 8.6 10*3/uL — AB (ref 1.7–7.7)
Neutrophils Relative %: 56 %
Platelets: 569 10*3/uL — ABNORMAL HIGH (ref 150–400)
RBC: 2.56 MIL/uL — ABNORMAL LOW (ref 3.87–5.11)
RDW: 19.2 % — AB (ref 11.5–15.5)
WBC: 15.3 10*3/uL — ABNORMAL HIGH (ref 4.0–10.5)

## 2016-03-19 LAB — COMPREHENSIVE METABOLIC PANEL
ALBUMIN: 4.2 g/dL (ref 3.5–5.0)
ALK PHOS: 56 U/L (ref 38–126)
ALT: 14 U/L (ref 14–54)
ANION GAP: 9 (ref 5–15)
AST: 15 U/L (ref 15–41)
BUN: 9 mg/dL (ref 6–20)
CALCIUM: 8.8 mg/dL — AB (ref 8.9–10.3)
CO2: 25 mmol/L (ref 22–32)
Chloride: 104 mmol/L (ref 101–111)
Creatinine, Ser: 0.61 mg/dL (ref 0.44–1.00)
GFR calc Af Amer: >60 mL/min (ref >60)
GFR calc non Af Amer: >60 mL/min (ref >60)
GLUCOSE: 92 mg/dL (ref 65–99)
Potassium: 3.7 mmol/L (ref 3.5–5.1)
SODIUM: 138 mmol/L (ref 135–145)
Total Bilirubin: 2 mg/dL — ABNORMAL HIGH (ref 0.3–1.2)
Total Protein: 6.8 g/dL (ref 6.5–8.1)

## 2016-03-19 LAB — RETICULOCYTES
RBC.: 2.56 MIL/uL — ABNORMAL LOW (ref 3.87–5.11)
Retic Count, Absolute: 422.4 10*3/uL — ABNORMAL HIGH (ref 19.0–186.0)
Retic Ct Pct: 16.5 % — ABNORMAL HIGH (ref 0.4–3.1)

## 2016-03-19 MED ORDER — DIPHENHYDRAMINE HCL 25 MG PO CAPS
25.0000 mg | ORAL_CAPSULE | ORAL | Status: DC | PRN
  Administered 2016-03-19: 25 mg via ORAL
  Filled 2016-03-19: qty 1

## 2016-03-19 MED ORDER — HEPARIN SOD (PORK) LOCK FLUSH 100 UNIT/ML IV SOLN
500.0000 [IU] | Freq: Once | INTRAVENOUS | Status: AC
  Administered 2016-03-19: 500 [IU]
  Filled 2016-03-19: qty 5

## 2016-03-19 MED ORDER — HYDROMORPHONE HCL 2 MG/ML IJ SOLN
2.0000 mg | INTRAMUSCULAR | Status: AC
  Filled 2016-03-19: qty 1

## 2016-03-19 MED ORDER — KETOROLAC TROMETHAMINE 30 MG/ML IJ SOLN
30.0000 mg | INTRAMUSCULAR | Status: AC
  Administered 2016-03-19: 30 mg via INTRAVENOUS
  Filled 2016-03-19: qty 1

## 2016-03-19 MED ORDER — HYDROMORPHONE HCL 2 MG/ML IJ SOLN: 2.0000 mg | INTRAMUSCULAR | Status: AC

## 2016-03-19 MED ORDER — HYDROMORPHONE HCL 2 MG/ML IJ SOLN
2.0000 mg | INTRAMUSCULAR | Status: AC
  Administered 2016-03-19: 2 mg via INTRAVENOUS
  Filled 2016-03-19: qty 1

## 2016-03-19 MED ORDER — ONDANSETRON HCL 4 MG/2ML IJ SOLN
4.0000 mg | INTRAMUSCULAR | Status: DC | PRN
  Administered 2016-03-19: 4 mg via INTRAVENOUS
  Filled 2016-03-19: qty 2

## 2016-03-19 MED ORDER — SODIUM CHLORIDE 0.45 % IV SOLN
INTRAVENOUS | Status: DC
  Administered 2016-03-19: 07:00:00 via INTRAVENOUS

## 2016-03-19 NOTE — ED Provider Notes (Signed)
[facility] DEPT Provider Note   CSN: [id number] Arrival date & time: 03/19/16  0308     History   Chief Complaint Chief Complaint  Patient presents with  . Sickle Cell Pain Crisis    HPI [name] is a 25 y.o. female.  The history is provided by the patient. No language interpreter was used.  Sickle Cell Pain Crisis    Past Medical History:  Diagnosis Date  . Blood transfusion    "lots"  . Blood transfusion without reported diagnosis   . Chronic back pain    "very severe; have knot in my back; from tight muscle; take RX and exercise for it"  . Depression 01/06/2011  . Exertional dyspnea    "sometimes"  . Genital HSV   . GERD (gastroesophageal reflux disease) 02/17/2011  . Migraines 11/08/11   "@ least twice/month"  . 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  . Mood swings ([place]) 11/08/11   "I go back and forth; real bad"  . Sickle cell anemia ([name])   . Sickle cell anemia with crisis ([place])   . Trichotillomania    h/o    Patient Active Problem List   Diagnosis Date Noted  . Syncope   . 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 Fall during current hospitalization   . 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 Sickle cell anemia with pain ([place]) 02/16/2016  . Chronic back pain   . Sickle cell anemia with crisis ([place]) 01/05/2016  . Nausea & vomiting 12/27/2015  . Sickle cell crisis ([place]) 11/09/2015  . Sepsis ([place]) 11/09/2015  . Thrombocytosis ([place]) 11/09/2015  . Vaginal bleeding 11/09/2015  . Hyperbilirubinemia 11/09/2015  . Pyrexia   . Vaginal delivery 10/31/2015  . Sickle cell pain crisis ([place]) 10/26/2015  . Positive GBS test 10/18/2015  . Narcotic dependence ([place]) 10/06/2015  . 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 Nausea and vomiting 09/07/2015  . High risk for intrapartum complications, antepartum 08/12/2015  .  Chronic pain 08/04/2015  . Cocaine use 07/24/2015  . Maternal sickle cell anemia affecting pregnancy, antepartum ([place]) 07/14/2015  . Anemia of chronic disease   . Hypokalemia 10/29/2014  . 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 Migraines 11/08/2011  . GERD (gastroesophageal reflux disease) 02/17/2011  . 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 Sickle cell disease ([place]) 01/08/2009  . 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 CHOLECYSTECTOMY  05/2010  . DILATION AND CURETTAGE OF UTERUS  02/20/11   S/P miscarriage  . IR GENERIC HISTORICAL  12/23/2015   IR FLUORO GUIDE CV LINE RIGHT 12/23/2015 Jacqulynn Cadet, MD [facility] RAD  . IR GENERIC HISTORICAL  12/23/2015   IR US GUIDE VASC ACCESS RIGHT 12/23/2015 Jacqulynn Cadet, MD [facility]    OB History    Gravida Para Term Preterm AB Living   3 2 2   1 2    SAB TAB Ectopic Multiple Live Births   1     0 2      Obstetric Comments   Miscarried in October 2012 at about 7 weeks       Home Medications    Prior to Admission medications   Medication Sig Start Date End Date Taking? Authorizing Provider  aspirin-acetaminophen-caffeine (EXCEDRIN MIGRAINE) [phone] MG tablet Take 2 tablets by mouth every 6 (six) hours as needed for headache.  Yes Historical Provider, MD  cyclobenzaprine (FLEXERIL) 10 MG tablet Take 1 tablet (10 mg total) by mouth 3 (three) times daily as needed for muscle spasms. 02/09/16  Yes Tresa Garter, MD  DULoxetine (CYMBALTA) 30 MG capsule Take 1 capsule (30 mg total) by mouth daily. 03/04/16  Yes [name], MD  hydroxyurea (HYDREA) 500 MG capsule Take 2 capsules (1,000 mg total) by mouth daily. May take with food to minimize GI side effects. 03/14/16  Yes Tresa Garter, MD  ibuprofen (ADVIL,MOTRIN) 600 MG tablet Take 1 tablet (600 mg total) by mouth every 6 (six) hours as needed for mild pain or moderate pain. 03/14/16  Yes Olugbemiga Essie Christine,  MD  morphine (MS CONTIN) 30 MG 12 hr tablet Take 1 tablet (30 mg total) by mouth every 12 (twelve) hours. 03/07/16  Yes Olugbemiga Essie Christine, MD  oxyCODONE (ROXICODONE) 15 MG immediate release tablet Take 1 tablet (15 mg total) by mouth every 4 (four) hours as needed for pain. 03/10/16 04/09/16 Yes Tresa Garter, MD    Family History Family History  Problem Relation Age of Onset  . Sickle cell trait Mother   . Sickle cell trait Father   . Diabetes Maternal Grandmother   . Diabetes Paternal Grandmother   . Hypertension Paternal Grandmother   . Diabetes Maternal Grandfather     Social History Social History  Substance Use Topics  . Smoking status: Former Smoker    Packs/day: 0.25    Years: 1.00    Types: Cigarettes    Quit date: 03/25/2013  . Smokeless tobacco: Never Used  . Alcohol use No     Comment: pt states she quit marijuan in May 2013. Rare ETOH, + cigarettes.  She is enrolled in school     Allergies   Food and Latex   Review of Systems Review of Systems   Physical Exam Updated Vital Signs BP 111/58 (BP Location: Left Arm)   Pulse 99   Temp 97.9 F (36.6 C) (Oral)   Resp 17   Ht 5\' 2"  (1.575 m)   Wt 78.9 kg   LMP 12/15/2015 (Approximate)   SpO2 97%   BMI 31.83 kg/m   Physical Exam   ED Treatments / Results  Labs (all labs ordered are listed, but only abnormal results are displayed) Labs Reviewed  COMPREHENSIVE METABOLIC PANEL  CBC WITH DIFFERENTIAL/PLATELET  RETICULOCYTES    EKG  EKG Interpretation None       Radiology No results found.  Procedures Procedures (including critical care time)  Medications Ordered in ED Medications  0.45 % sodium chloride infusion (not administered)  ketorolac (TORADOL) 30 MG/ML injection 30 mg (not administered)  HYDROmorphone (DILAUDID) injection 2 mg (not administered)    Or  HYDROmorphone (DILAUDID) injection 2 mg (not administered)  HYDROmorphone (DILAUDID) injection 2 mg (not  administered)    Or  HYDROmorphone (DILAUDID) injection 2 mg (not administered)  HYDROmorphone (DILAUDID) injection 2 mg (not administered)    Or  HYDROmorphone (DILAUDID) injection 2 mg (not administered)  diphenhydrAMINE (BENADRYL) capsule 25-50 mg (not administered)  ondansetron (ZOFRAN) injection 4 mg (not administered)     Initial Impression / Assessment and Plan / ED Course  I have reviewed the triage vital signs and the nursing notes.  Pertinent labs & imaging results that were available during my care of the patient were reviewed by me and considered in my medical decision making (see chart for details).  Clinical Course   5:40 - [address]  being accessed now. Patient sleeping, snoring, in NAD. VSS. Medication orders postponed until reassessment.  Final Clinical Impressions(s) / ED Diagnoses   Final diagnoses:  None    New Prescriptions New Prescriptions   No medications on file     Dennie Bible 03/19/16 0630    April Palumbo, MD 03/19/16 [phone]

## 2016-03-19 NOTE — ED Notes (Signed)
Dilaudid scanned and given at 0800, not showing in [place]

## 2016-03-19 NOTE — ED Notes (Signed)
Bed: WA25 Expected date:  Expected time:  Means of arrival:  Comments: Hold for triage 1 

## 2016-03-19 NOTE — ED Notes (Cosign Needed)
Patient was signed out to me at the beginning of shift. She has history of sickle cell anemia presenting with complaints of sickle cell related pain. Please refer to Charlann Lange, PA-C notes for a more comprehensive detail of her ER visit. Patient received several doses of pain medication while she is in the ER and states that her pain is now manageable and requests to be discharged. She will follow-up with her doctor for further care. Patient is stable for discharge.  BP 118/67   Pulse 102   Temp 97.6 F (36.4 C) (Oral)   Resp 18   Ht 5\' 2"  (1.575 m)   Wt 79.4 kg   LMP 12/15/2015 (Approximate)   SpO2 (!) 87%   BMI 32.02 kg/m   Results for orders placed or performed during the hospital encounter of 03/19/16  Comprehensive metabolic panel  Result Value Ref Range   Sodium 138 135 - 145 mmol/L   Potassium 3.7 3.5 - 5.1 mmol/L   Chloride 104 101 - 111 mmol/L   CO2 25 22 - 32 mmol/L   Glucose, Bld 92 65 - 99 mg/dL   BUN 9 6 - 20 mg/dL   Creatinine, Ser 0.61 0.44 - 1.00 mg/dL   Calcium 8.8 (L) 8.9 - 10.3 mg/dL   Total Protein 6.8 6.5 - 8.1 g/dL   Albumin 4.2 3.5 - 5.0 g/dL   AST 15 15 - 41 U/L   ALT 14 14 - 54 U/L   Alkaline Phosphatase 56 38 - 126 U/L   Total Bilirubin 2.0 (H) 0.3 - 1.2 mg/dL   GFR calc non Af Amer >60 >60 mL/min   GFR calc Af Amer >60 >60 mL/min   Anion gap 9 5 - 15  CBC with Differential  Result Value Ref Range   WBC 15.3 (H) 4.0 - 10.5 K/uL   RBC 2.56 (L) 3.87 - 5.11 MIL/uL   Hemoglobin 7.8 (L) 12.0 - 15.0 g/dL   HCT 22.3 (L) 36.0 - 46.0 %   MCV 87.1 78.0 - 100.0 fL   MCH 30.5 26.0 - 34.0 pg   MCHC 35.0 30.0 - 36.0 g/dL   RDW 19.2 (H) 11.5 - 15.5 %   Platelets 569 (H) 150 - 400 K/uL   Neutrophils Relative % 56 %   Neutro Abs 8.6 (H) 1.7 - 7.7 K/uL   Lymphocytes Relative 27 %   Lymphs Abs 4.1 (H) 0.7 - 4.0 K/uL   Monocytes Relative 9 %   Monocytes Absolute 1.4 (H) 0.1 - 1.0 K/uL   Eosinophils Relative 7 %   Eosinophils Absolute 1.0 (H) 0.0 - 0.7 K/uL    Basophils Relative 1 %   Basophils Absolute 0.1 0.0 - 0.1 K/uL  Reticulocytes  Result Value Ref Range   Retic Ct Pct 16.5 (H) 0.4 - 3.1 %   RBC. 2.56 (L) 3.87 - 5.11 MIL/uL   Retic Count, Manual 422.4 (H) 19.0 - 186.0 K/uL   *Note: Due to a large number of results and/or encounters for the requested time period, some results have not been displayed. A complete set of results can be found in Results Review.   Dg Chest 2 View  Result Date: 02/26/2016 CLINICAL DATA:  Chest pain, sickle cell crisis. EXAM: CHEST  2 VIEW COMPARISON:  Radiographs of January 03, 2016. FINDINGS: The heart size and mediastinal contours are within normal limits. Both lungs are clear. No pneumothorax or pleural effusion is noted. Right internal jugular Port-A-Cath is unchanged in position. Skeletal changes are  noted consistent with history of sickle cell disease. IMPRESSION: No active cardiopulmonary disease. Electronically Signed   By: Marijo Conception, M.D.   On: 02/26/2016 10:31   Ct Angio Chest Pe W And/or Wo Contrast  Result Date: 02/26/2016 CLINICAL DATA:  25 year old with sickle cell anemia. Patient complains of back and flank pain. EXAM: CT ANGIOGRAPHY CHEST CT ABDOMEN AND PELVIS WITH CONTRAST TECHNIQUE: Multidetector CT imaging of the chest was performed using the standard protocol during bolus administration of intravenous contrast. Multiplanar CT image reconstructions and MIPs were obtained to evaluate the vascular anatomy. Multidetector CT imaging of the abdomen and pelvis was performed using the standard protocol during bolus administration of intravenous contrast. CONTRAST:  130 mL Isovue 370 There was an issue with contrast leaking from the Port-A-Cath. As a result, contrast was given on 2 occasions. COMPARISON:  07/18/2012 and 11/30/2011 FINDINGS: CTA CHEST FINDINGS Cardiovascular: Study is not adequate to evaluate for pulmonary embolism. There is motion artifact and mixing of contrast and blood in the lobar  and distal pulmonary arteries. Normal caliber of the thoracic aorta. Incidentally, the left vertebral artery originates off of the arch which is a normal variant. Right thyroid lobe appears to mildly enlarged but poorly characterized. Central line tip extends into the right atrium region. Mediastinum/Nodes: Small amount of anterior mediastinal tissue is suggestive for residual thymus. Small lymph nodes in the prevascular space. Overall, there is not significant chest lymphadenopathy. No significant axillary lymphadenopathy. Lungs/Pleura: Trachea and mainstem bronchi are patent. Stable pleural-based nodule in the right upper lobe on sequence 17, image 16. Subtle nodular density in the right lower lobe on image 41 is essentially unchanged. There is a bilobed nodular structure in the superior segment of left lower lobe that measures roughly 6 mm and stable. This is seen on sequence 17, image 42. No large areas of airspace disease or consolidation. Musculoskeletal: Again noted is marked sclerosis in the left humeral head which could be related to avascular necrosis. Review of the MIP images confirms the above findings. CT ABDOMEN and PELVIS FINDINGS Hepatobiliary: Gallbladder has been removed. Normal appearance of the liver without significant biliary dilatation. Portal venous system is patent. Pancreas: Normal appearance of the pancreas without inflammation or duct dilatation. Spleen: The spleen has diffuse nodularity and similar to the previous examination. Similar finding was noted on a CT from 11/30/2011. No significant splenic enlargement. Adrenals/Urinary Tract: Normal adrenal glands. Normal appearance of both kidneys without hydronephrosis. Urinary bladder is unremarkable. Limited evaluation for renal calculi because there is contrast in the renal collecting systems. Stomach/Bowel: Stomach is within normal limits. Appendix appears normal. No evidence of bowel wall thickening, distention, or inflammatory changes.  Vascular/Lymphatic: No significant vascular findings are present. No enlarged abdominal or pelvic lymph nodes. Reproductive: Uterus and bilateral adnexa are unremarkable. Other: No free fluid. No free air. Abdominal wall laxity near the umbilicus. There is a tiny umbilical hernia containing fat. Musculoskeletal: Changes in the vertebral body endplates compatible with sickle cell disease. Partially visualized lucency in the proximal right femur is incompletely evaluated. Patchy sclerosis in both femoral heads suggestive for AVN. Review of the MIP images confirms the above findings. IMPRESSION: Suboptimal evaluation for pulmonary emboli due to poor opacification of the pulmonary arteries as described. Pulmonary emboli cannot be excluded on this examination. No acute chest abnormality. Stable small pulmonary nodules that are probably postinflammatory. No acute abnormality in the abdomen or pelvis. Stable nodularity throughout the spleen. This nodularity has been present since 11/30/2011 and suggestive  for a benign etiology. This is most likely associated with the sickle cell anemia. Diffuse bone changes compatible with sickle cell anemia. 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 Electronically Signed   By: Markus Daft M.D.   On: 02/26/2016 14:17   Ct Abdomen Pelvis W Contrast  Result Date: 02/26/2016 CLINICAL DATA:  25 year old with sickle cell anemia. Patient complains of back and flank pain. EXAM: CT ANGIOGRAPHY CHEST CT ABDOMEN AND PELVIS WITH CONTRAST TECHNIQUE: Multidetector CT imaging of the chest was performed using the standard protocol during bolus administration of intravenous contrast. Multiplanar CT image reconstructions and MIPs were obtained to evaluate the vascular anatomy. Multidetector CT imaging of the abdomen and pelvis was performed using the standard protocol during bolus administration of intravenous contrast. CONTRAST:  130 mL Isovue 370 There was  an issue with contrast leaking from the Port-A-Cath. As a result, contrast was given on 2 occasions. COMPARISON:  07/18/2012 and 11/30/2011 FINDINGS: CTA CHEST FINDINGS Cardiovascular: Study is not adequate to evaluate for pulmonary embolism. There is motion artifact and mixing of contrast and blood in the lobar and distal pulmonary arteries. Normal caliber of the thoracic aorta. Incidentally, the left vertebral artery originates off of the arch which is a normal variant. Right thyroid lobe appears to mildly enlarged but poorly characterized. Central line tip extends into the right atrium region. Mediastinum/Nodes: Small amount of anterior mediastinal tissue is suggestive for residual thymus. Small lymph nodes in the prevascular space. Overall, there is not significant chest lymphadenopathy. No significant axillary lymphadenopathy. Lungs/Pleura: Trachea and mainstem bronchi are patent. Stable pleural-based nodule in the right upper lobe on sequence 17, image 16. Subtle nodular density in the right lower lobe on image 41 is essentially unchanged. There is a bilobed nodular structure in the superior segment of left lower lobe that measures roughly 6 mm and stable. This is seen on sequence 17, image 42. No large areas of airspace disease or consolidation. Musculoskeletal: Again noted is marked sclerosis in the left humeral head which could be related to avascular necrosis. Review of the MIP images confirms the above findings. CT ABDOMEN and PELVIS FINDINGS Hepatobiliary: Gallbladder has been removed. Normal appearance of the liver without significant biliary dilatation. Portal venous system is patent. Pancreas: Normal appearance of the pancreas without inflammation or duct dilatation. Spleen: The spleen has diffuse nodularity and similar to the previous examination. Similar finding was noted on a CT from 11/30/2011. No significant splenic enlargement. Adrenals/Urinary Tract: Normal adrenal glands. Normal appearance of  both kidneys without hydronephrosis. Urinary bladder is unremarkable. Limited evaluation for renal calculi because there is contrast in the renal collecting systems. Stomach/Bowel: Stomach is within normal limits. Appendix appears normal. No evidence of bowel wall thickening, distention, or inflammatory changes. Vascular/Lymphatic: No significant vascular findings are present. No enlarged abdominal or pelvic lymph nodes. Reproductive: Uterus and bilateral adnexa are unremarkable. Other: No free fluid. No free air. Abdominal wall laxity near the umbilicus. There is a tiny umbilical hernia containing fat. Musculoskeletal: Changes in the vertebral body endplates compatible with sickle cell disease. Partially visualized lucency in the proximal right femur is incompletely evaluated. Patchy sclerosis in both femoral heads suggestive for AVN. Review of the MIP images confirms the above findings. IMPRESSION: Suboptimal evaluation for pulmonary emboli due to poor opacification of the pulmonary arteries as described. Pulmonary emboli cannot be excluded on this examination. No acute chest abnormality. Stable small pulmonary nodules that are probably postinflammatory. No acute abnormality in  the abdomen or pelvis. Stable nodularity throughout the spleen. This nodularity has been present since 11/30/2011 and suggestive for a benign etiology. This is most likely associated with the sickle cell anemia. Diffuse bone changes compatible with sickle cell anemia. 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 Electronically Signed   By: Markus Daft M.D.   On: 02/26/2016 14:17        Domenic Moras, PA-C 03/19/16 [place], PA-C 03/19/16 1006

## 2016-03-19 NOTE — ED Notes (Signed)
Patient is resting comfortably.Unable to stay awake for full pain assessment. Mumbled 8 then started back with snoring resp. VS updated. Continue to monitor.

## 2016-03-19 NOTE — ED Notes (Signed)
Patient is resting, Hydromorhone given due to pain level. Juice and crackers provided, pt states she is hoping this will settle her pain so she can go home

## 2016-03-19 NOTE — ED Provider Notes (Signed)
[facility] DEPT Provider Note   CSN: [id number] Arrival date & time: 03/19/16  0308     History   Chief Complaint Chief Complaint  Patient presents with  . Sickle Cell Pain Crisis    HPI [name] is a 25 y.o. female.  Patient with history of sickle cell (HB-SS) presents with joint pain limited to right knee. No swelling, injury, redness or fever. She denies chest pain, cough, nausea, vomiting. She has been taking her regular medications without relief. Symptoms started yesterday and became worse throughout the day.   The history is provided by the patient. No language interpreter was used.  Sickle Cell Pain Crisis  Location:  Lower extremity Associated symptoms: sore throat   Associated symptoms: no chest pain, no congestion, no cough, no fever, no nausea, no shortness of breath and no vomiting     Past Medical History:  Diagnosis Date  . Blood transfusion    "lots"  . Blood transfusion without reported diagnosis   . Chronic back pain    "very severe; have knot in my back; from tight muscle; take RX and exercise for it"  . Depression 01/06/2011  . Exertional dyspnea    "sometimes"  . Genital HSV   . GERD (gastroesophageal reflux disease) 02/17/2011  . Migraines 11/08/11   "@ least twice/month"  . 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  . Mood swings ([name]) 11/08/11   "I go back and forth; real bad"  . Sickle cell anemia ([name])   . Sickle cell anemia with crisis ([place])   . Trichotillomania    h/o    Patient Active Problem List   Diagnosis Date Noted  . Syncope   . Episode of recurrent major depressive disorder ([place])   . Fall during current hospitalization   . Hb-SS disease with crisis ([place]) 02/26/2016  . Sickle cell anemia with pain ([place]) 02/16/2016  . Chronic back pain   . Sickle cell anemia with crisis ([place]) 01/05/2016  . Nausea & vomiting 12/27/2015  . Sickle cell crisis ([place]) 11/09/2015  . Sepsis ([place]) 11/09/2015  . Thrombocytosis  ([place]) 11/09/2015  . Vaginal bleeding 11/09/2015  . Hyperbilirubinemia 11/09/2015  . Pyrexia   . Vaginal delivery 10/31/2015  . Sickle cell pain crisis ([place]) 10/26/2015  . Positive GBS test 10/18/2015  . Narcotic dependence ([place]) 10/06/2015  . 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 Nausea and vomiting 09/07/2015  . High risk for intrapartum complications, antepartum 08/12/2015  . Chronic pain 08/04/2015  . Cocaine use 07/24/2015  . Maternal sickle cell anemia affecting pregnancy, antepartum ([place]) 07/14/2015  . Anemia of chronic disease   . Hypokalemia 10/29/2014  . Hb-SS disease without crisis ([place]) 02/26/2013  . Migraines 11/08/2011  . GERD (gastroesophageal reflux disease) 02/17/2011  . 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 Sickle cell disease ([place]) 01/08/2009  . 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 CHOLECYSTECTOMY  05/2010  . DILATION AND CURETTAGE OF UTERUS  02/20/11   S/P miscarriage  . IR GENERIC HISTORICAL  12/23/2015   IR FLUORO GUIDE CV LINE RIGHT 12/23/2015 Jacqulynn Cadet, MD [facility]  . IR GENERIC HISTORICAL  12/23/2015   IR US GUIDE VASC ACCESS RIGHT 12/23/2015 Jacqulynn Cadet, MD [facility]    OB History    Gravida Para Term Preterm AB Living   3 2 2   1  2  SAB TAB Ectopic Multiple Live Births   1     0 2      Obstetric Comments   Miscarried in October 2012 at about 7 weeks       Home Medications    Prior to Admission medications   Medication Sig Start Date End Date Taking? Authorizing Provider  aspirin-acetaminophen-caffeine (EXCEDRIN MIGRAINE) [phone] MG tablet Take 2 tablets by mouth every 6 (six) hours as needed for headache.    Yes Historical Provider, MD  cyclobenzaprine (FLEXERIL) 10 MG tablet Take 1 tablet (10 mg total) by mouth 3 (three) times daily as needed for muscle spasms. 02/09/16  Yes  Tresa Garter, MD  DULoxetine (CYMBALTA) 30 MG capsule Take 1 capsule (30 mg total) by mouth daily. 03/04/16  Yes [name], MD  hydroxyurea (HYDREA) 500 MG capsule Take 2 capsules (1,000 mg total) by mouth daily. May take with food to minimize GI side effects. 03/14/16  Yes Tresa Garter, MD  ibuprofen (ADVIL,MOTRIN) 600 MG tablet Take 1 tablet (600 mg total) by mouth every 6 (six) hours as needed for mild pain or moderate pain. 03/14/16  Yes Olugbemiga Essie Christine, MD  morphine (MS CONTIN) 30 MG 12 hr tablet Take 1 tablet (30 mg total) by mouth every 12 (twelve) hours. 03/07/16  Yes Olugbemiga Essie Christine, MD  oxyCODONE (ROXICODONE) 15 MG immediate release tablet Take 1 tablet (15 mg total) by mouth every 4 (four) hours as needed for pain. 03/10/16 04/09/16 Yes Tresa Garter, MD    Family History Family History  Problem Relation Age of Onset  . Sickle cell trait Mother   . Sickle cell trait Father   . Diabetes Maternal Grandmother   . Diabetes Paternal Grandmother   . Hypertension Paternal Grandmother   . Diabetes Maternal Grandfather     Social History Social History  Substance Use Topics  . Smoking status: Former Smoker    Packs/day: 0.25    Years: 1.00    Types: Cigarettes    Quit date: 03/25/2013  . Smokeless tobacco: Never Used  . Alcohol use No     Comment: pt states she quit marijuan in May 2013. Rare ETOH, + cigarettes.  She is enrolled in school     Allergies   Food and Latex   Review of Systems Review of Systems  Constitutional: Negative for fever.  HENT: Positive for sore throat. Negative for congestion and trouble swallowing.   Respiratory: Negative for cough and shortness of breath.   Cardiovascular: Negative for chest pain.  Gastrointestinal: Negative for abdominal pain, nausea and vomiting.  Musculoskeletal: Positive for arthralgias.  Skin: Negative for color change.  Neurological: Negative.      Physical Exam Updated Vital  Signs BP 101/72 (BP Location: Right Arm)   Pulse 92   Temp 97.8 F (36.6 C) (Oral)   Resp 16   Ht 5\' 2"  (1.575 m)   Wt 78.9 kg   LMP 12/15/2015 (Approximate)   SpO2 94%   BMI 31.83 kg/m   Physical Exam  Constitutional: She is oriented to person, place, and time. She appears well-developed and well-nourished. No distress.  HENT:  Head: Normocephalic.  Eyes: Conjunctivae are normal.  Neck: Normal range of motion. Neck supple.  Cardiovascular: Normal rate and regular rhythm.   Pulmonary/Chest: Effort normal and breath sounds normal.  Abdominal: Soft. Bowel sounds are normal. There is no tenderness. There is no rebound and no guarding.  Musculoskeletal: Normal range of motion.  Right knee  without significant swelling or warmth. FROM. Distal pulses intact. No calf or thigh tenderness.   Neurological: She is alert and oriented to person, place, and time.  Skin: Skin is warm and dry. No rash noted.  Psychiatric: She has a normal mood and affect.     ED Treatments / Results  Labs (all labs ordered are listed, but only abnormal results are displayed) Labs Reviewed  CBC WITH DIFFERENTIAL/PLATELET - Abnormal; Notable for the following:       Result Value   WBC 15.3 (*)    RBC 2.56 (*)    Hemoglobin 7.8 (*)    HCT 22.3 (*)    RDW 19.2 (*)    Platelets 569 (*)    Neutro Abs 8.6 (*)    Lymphs Abs 4.1 (*)    Monocytes Absolute 1.4 (*)    Eosinophils Absolute 1.0 (*)    All other components within normal limits  RETICULOCYTES - Abnormal; Notable for the following:    Retic Ct Pct 16.5 (*)    RBC. 2.56 (*)    Retic Count, Manual 422.4 (*)    All other components within normal limits  COMPREHENSIVE METABOLIC PANEL    EKG  EKG Interpretation None       Radiology No results found.  Procedures Procedures (including critical care time)  Medications Ordered in ED Medications  0.45 % sodium chloride infusion (not administered)  ketorolac (TORADOL) 30 MG/ML injection 30  mg (not administered)  HYDROmorphone (DILAUDID) injection 2 mg (not administered)    Or  HYDROmorphone (DILAUDID) injection 2 mg (not administered)  HYDROmorphone (DILAUDID) injection 2 mg (not administered)    Or  HYDROmorphone (DILAUDID) injection 2 mg (not administered)  HYDROmorphone (DILAUDID) injection 2 mg (not administered)    Or  HYDROmorphone (DILAUDID) injection 2 mg (not administered)  diphenhydrAMINE (BENADRYL) capsule 25-50 mg (not administered)  ondansetron (ZOFRAN) injection 4 mg (not administered)     Initial Impression / Assessment and Plan / ED Course  I have reviewed the triage vital signs and the nursing notes.  Pertinent labs & imaging results that were available during my care of the patient were reviewed by me and considered in my medical decision making (see chart for details).  Clinical Course   Patient with history of sickle cell here with typical pain in right knee. No fever. Sickle Cell protocol in place.  Patient care signed out to Domenic Moras, PA-C, for further management and disposition.  Final Clinical Impressions(s) / ED Diagnoses   Final diagnoses:  None    New Prescriptions New Prescriptions   No medications on file     Dennie Bible 03/19/16 [id number]    April Palumbo, MD 03/19/16 [phone]

## 2016-03-19 NOTE — ED Triage Notes (Signed)
Pt presents d/t typical sickle cell pain in her right knee since yesterday morning.  Reports taking her home medications w/o relief.  Rates pain 8/10.

## 2016-03-22 ENCOUNTER — Other Ambulatory Visit: Payer: Self-pay

## 2016-03-22 ENCOUNTER — Other Ambulatory Visit: Payer: Self-pay | Admitting: Internal Medicine

## 2016-03-22 DIAGNOSIS — D571 Sickle-cell disease without crisis: Secondary | ICD-10-CM

## 2016-03-22 MED ORDER — OXYCODONE HCL 15 MG PO TABS: 15.0000 mg | ORAL_TABLET | ORAL | 0 refills | Status: DC | PRN

## 2016-03-22 MED FILL — oxyCODONE HCL 15 MG TABS: 15 | 15 days supply | Qty: 90 | Fill #0

## 2016-03-22 NOTE — Patient Outreach (Signed)
[facility]) Care Management  03/22/2016  [name] 06/13/1990 [id number]   Referral date:  03/21/16  Referral Source:  Emergency department census Referral reason:  4 or more emergency department visits within the past 6 months.   Telephone call to patient regarding ED referral.  Unable to reach patient or leave voice message due to mailbox being full.    RNCM will attempt 2nd telephone call to patient within 1 week.   [name] RN,BSN,CCM [facility] Telephonic  [phone]

## 2016-03-24 ENCOUNTER — Other Ambulatory Visit: Payer: Self-pay | Admitting: Internal Medicine

## 2016-03-24 ENCOUNTER — Other Ambulatory Visit: Payer: Self-pay

## 2016-03-24 DIAGNOSIS — D571 Sickle-cell disease without crisis: Secondary | ICD-10-CM

## 2016-03-24 MED ORDER — OXYCODONE HCL 15 MG PO TABS: 15.0000 mg | ORAL_TABLET | ORAL | 0 refills | Status: DC | PRN

## 2016-03-24 MED FILL — oxyCODONE HCL 15 MG TABS: 15 | 15 days supply | Qty: 90 | Fill #0

## 2016-03-24 NOTE — Patient Outreach (Signed)
[facility]) Care Management  03/24/2016  [name] 28-Jan-1991 [id number]  Referral date:  03/21/16             Referral Source:  Emergency department census Referral reason:  4 or more emergency department visits within the past 6 months.   Second telephone call to patient regarding emergency department census referral. Unable to reach patient or leave voice message due to mailbox being full.   PLAN:  RNCM will attempt 3rd telephone outreach to patient within 1 week.  [name] RN,BSN,CCM [facility] Telephonic  [phone]

## 2016-03-28 ENCOUNTER — Other Ambulatory Visit: Payer: Self-pay

## 2016-03-28 NOTE — Patient Outreach (Signed)
[facility]) Care Management  03/28/2016  [name] Nov 08, 1990 [id number]  Third telephone call to patient regarding ED census referral. Unable to reach patient.  HIPAA compliant voice message left with call back phone number.   PLAN; RNCM will send patient outreach letter to attempt contact.   [name] RN,BSN,CCM [facility] Telephonic  [phone]

## 2016-03-29 ENCOUNTER — Ambulatory Visit: Payer: Medicare Other | Admitting: Internal Medicine

## 2016-03-31 ENCOUNTER — Inpatient Hospital Stay (HOSPITAL_COMMUNITY)
Admission: EM | Admit: 2016-03-31 | Discharge: 2016-04-07 | DRG: 881 | Disposition: A | Discharge status: Home / Self Care | Payer: Medicare Other | Attending: Internal Medicine | Admitting: Internal Medicine

## 2016-03-31 ENCOUNTER — Encounter (HOSPITAL_COMMUNITY): Payer: Self-pay | Admitting: Nurse Practitioner

## 2016-03-31 DIAGNOSIS — D72829 Elevated white blood cell count, unspecified: Secondary | ICD-10-CM | POA: Diagnosis not present

## 2016-03-31 DIAGNOSIS — G8929 Other chronic pain: Secondary | ICD-10-CM | POA: Diagnosis present

## 2016-03-31 DIAGNOSIS — F53 Postpartum depression: Secondary | ICD-10-CM

## 2016-03-31 DIAGNOSIS — D5700 Hb-SS disease with crisis, unspecified: Secondary | ICD-10-CM | POA: Diagnosis present

## 2016-03-31 DIAGNOSIS — Z8249 Family history of ischemic heart disease and other diseases of the circulatory system: Secondary | ICD-10-CM | POA: Diagnosis not present

## 2016-03-31 DIAGNOSIS — Z91040 Latex allergy status: Secondary | ICD-10-CM

## 2016-03-31 DIAGNOSIS — N939 Abnormal uterine and vaginal bleeding, unspecified: Secondary | ICD-10-CM

## 2016-03-31 DIAGNOSIS — Z818 Family history of other mental and behavioral disorders: Secondary | ICD-10-CM | POA: Diagnosis not present

## 2016-03-31 DIAGNOSIS — F1410 Cocaine abuse, uncomplicated: Secondary | ICD-10-CM | POA: Diagnosis not present

## 2016-03-31 DIAGNOSIS — M549 Dorsalgia, unspecified: Secondary | ICD-10-CM | POA: Diagnosis present

## 2016-03-31 DIAGNOSIS — E876 Hypokalemia: Secondary | ICD-10-CM | POA: Diagnosis not present

## 2016-03-31 DIAGNOSIS — R45851 Suicidal ideations: Secondary | ICD-10-CM | POA: Diagnosis not present

## 2016-03-31 DIAGNOSIS — D638 Anemia in other chronic diseases classified elsewhere: Secondary | ICD-10-CM

## 2016-03-31 DIAGNOSIS — Z79899 Other long term (current) drug therapy: Secondary | ICD-10-CM

## 2016-03-31 DIAGNOSIS — O99345 Other mental disorders complicating the puerperium: Secondary | ICD-10-CM

## 2016-03-31 DIAGNOSIS — N920 Excessive and frequent menstruation with regular cycle: Secondary | ICD-10-CM

## 2016-03-31 DIAGNOSIS — F329 Major depressive disorder, single episode, unspecified: Secondary | ICD-10-CM | POA: Diagnosis not present

## 2016-03-31 DIAGNOSIS — Z8489 Family history of other specified conditions: Secondary | ICD-10-CM | POA: Diagnosis not present

## 2016-03-31 DIAGNOSIS — Z91018 Allergy to other foods: Secondary | ICD-10-CM | POA: Diagnosis not present

## 2016-03-31 DIAGNOSIS — D62 Acute posthemorrhagic anemia: Secondary | ICD-10-CM | POA: Diagnosis not present

## 2016-03-31 DIAGNOSIS — Z87891 Personal history of nicotine dependence: Secondary | ICD-10-CM

## 2016-03-31 DIAGNOSIS — Z833 Family history of diabetes mellitus: Secondary | ICD-10-CM | POA: Diagnosis not present

## 2016-03-31 LAB — CBC WITH DIFFERENTIAL/PLATELET
Basophils Absolute: 0.1 10*3/uL (ref 0.0–0.1)
Basophils Relative: 1 %
Eosinophils Absolute: 0.1 10*3/uL (ref 0.0–0.7)
Eosinophils Relative: 1 %
HCT: 21.3 % — ABNORMAL LOW (ref 36.0–46.0)
Hemoglobin: 7.4 g/dL — ABNORMAL LOW (ref 12.0–15.0)
Lymphocytes Relative: 25 %
Lymphs Abs: 2.5 10*3/uL (ref 0.7–4.0)
MCH: 29.1 pg (ref 26.0–34.0)
MCHC: 34.7 g/dL (ref 30.0–36.0)
MCV: 83.9 fL (ref 78.0–100.0)
Monocytes Absolute: 1.5 10*3/uL — ABNORMAL HIGH (ref 0.1–1.0)
Monocytes Relative: 15 %
Neutro Abs: 5.6 10*3/uL (ref 1.7–7.7)
Neutrophils Relative %: 58 %
Platelets: 440 10*3/uL — ABNORMAL HIGH (ref 150–400)
RBC: 2.54 MIL/uL — ABNORMAL LOW (ref 3.87–5.11)
RDW: 17.9 % — ABNORMAL HIGH (ref 11.5–15.5)
WBC: 9.8 10*3/uL (ref 4.0–10.5)

## 2016-03-31 LAB — RETICULOCYTES
RBC.: 2.54 MIL/uL — ABNORMAL LOW (ref 3.87–5.11)
Retic Count, Absolute: 342.9 10*3/uL — ABNORMAL HIGH (ref 19.0–186.0)
Retic Ct Pct: 13.5 % — ABNORMAL HIGH (ref 0.4–3.1)

## 2016-03-31 LAB — BASIC METABOLIC PANEL
Anion gap: 8 (ref 5–15)
BUN: <5 mg/dL — ABNORMAL LOW (ref 6–20)
CO2: 24 mmol/L (ref 22–32)
Calcium: 8.5 mg/dL — ABNORMAL LOW (ref 8.9–10.3)
Chloride: 108 mmol/L (ref 101–111)
Creatinine, Ser: 0.73 mg/dL (ref 0.44–1.00)
GFR calc Af Amer: >60 mL/min (ref >60)
GFR calc non Af Amer: >60 mL/min (ref >60)
Glucose, Bld: 89 mg/dL (ref 65–99)
Potassium: 3.1 mmol/L — ABNORMAL LOW (ref 3.5–5.1)
Sodium: 140 mmol/L (ref 135–145)

## 2016-03-31 MED ORDER — SODIUM CHLORIDE 0.45 % IV SOLN
INTRAVENOUS | Status: DC
  Administered 2016-03-31: 11:00:00 via INTRAVENOUS

## 2016-03-31 MED ORDER — ENOXAPARIN SODIUM 40 MG/0.4ML ~~LOC~~ SOLN
40.0000 mg | SUBCUTANEOUS | Status: DC
  Administered 2016-04-01 – 2016-04-03 (×3): 40 mg via SUBCUTANEOUS
  Filled 2016-03-31 (×5): qty 0.4

## 2016-03-31 MED ORDER — MORPHINE SULFATE ER 30 MG PO TBCR
30.0000 mg | EXTENDED_RELEASE_TABLET | Freq: Two times a day (BID) | ORAL | Status: DC
  Administered 2016-03-31 – 2016-04-07 (×14): 30 mg via ORAL
  Filled 2016-03-31 (×5): qty 1
  Filled 2016-03-31: qty 2
  Filled 2016-03-31 (×8): qty 1

## 2016-03-31 MED ORDER — SENNOSIDES-DOCUSATE SODIUM 8.6-50 MG PO TABS
1.0000 | ORAL_TABLET | Freq: Two times a day (BID) | ORAL | Status: DC
  Administered 2016-03-31 – 2016-04-06 (×13): 1 via ORAL
  Filled 2016-03-31 (×13): qty 1

## 2016-03-31 MED ORDER — NALOXONE HCL 0.4 MG/ML IJ SOLN: 0.4000 mg | INTRAMUSCULAR | Status: DC | PRN

## 2016-03-31 MED ORDER — DIPHENHYDRAMINE HCL 25 MG PO CAPS
50.0000 mg | ORAL_CAPSULE | Freq: Once | ORAL | Status: AC
  Administered 2016-03-31: 50 mg via ORAL
  Filled 2016-03-31: qty 2

## 2016-03-31 MED ORDER — DIPHENHYDRAMINE HCL 25 MG PO CAPS
25.0000 mg | ORAL_CAPSULE | ORAL | Status: DC | PRN
  Administered 2016-04-01 – 2016-04-04 (×5): 50 mg via ORAL
  Filled 2016-03-31 (×6): qty 2

## 2016-03-31 MED ORDER — HYDROMORPHONE 1 MG/ML IV SOLN
INTRAVENOUS | Status: DC
  Administered 2016-03-31: 17:00:00 via INTRAVENOUS
  Administered 2016-03-31: 3.6 mg via INTRAVENOUS
  Administered 2016-03-31: 2.4 mg via INTRAVENOUS
  Administered 2016-04-01: 1.79 mg via INTRAVENOUS
  Administered 2016-04-01: 6.6 mg via INTRAVENOUS
  Administered 2016-04-01: 0.6 mg via INTRAVENOUS
  Administered 2016-04-01: 11:00:00 via INTRAVENOUS
  Administered 2016-04-01: 6 mg via INTRAVENOUS
  Administered 2016-04-01: 6.6 mg via INTRAVENOUS
  Administered 2016-04-01: 4.8 mg via INTRAVENOUS
  Administered 2016-04-02: 1.8 mg via INTRAVENOUS
  Administered 2016-04-02: 4.2 mg via INTRAVENOUS
  Administered 2016-04-02: 3.6 mg via INTRAVENOUS
  Administered 2016-04-02: 20:00:00 via INTRAVENOUS
  Administered 2016-04-02: 1.8 mg via INTRAVENOUS
  Administered 2016-04-03 (×2): 3.6 mg via INTRAVENOUS
  Administered 2016-04-03: 7.2 mg via INTRAVENOUS
  Administered 2016-04-03: 2.8 mg via INTRAVENOUS
  Administered 2016-04-03: 4.2 mg via INTRAVENOUS
  Administered 2016-04-03: 3.6 mg via INTRAVENOUS
  Administered 2016-04-04: 1.8 mg via INTRAVENOUS
  Administered 2016-04-04: 6 mg via INTRAVENOUS
  Administered 2016-04-04: 2.4 mg via INTRAVENOUS
  Administered 2016-04-04: 1.8 mg via INTRAVENOUS
  Administered 2016-04-04: 6.2 mg via INTRAVENOUS
  Administered 2016-04-05: 1.2 mg via INTRAVENOUS
  Administered 2016-04-05: 05:00:00 via INTRAVENOUS
  Administered 2016-04-05: 5.4 mg via INTRAVENOUS
  Administered 2016-04-05: 3.5 mg via INTRAVENOUS
  Administered 2016-04-05: 4.8 mg via INTRAVENOUS
  Administered 2016-04-05: 1.8 mg via INTRAVENOUS
  Administered 2016-04-05: 10.8 mg via INTRAVENOUS
  Administered 2016-04-06: 6.6 mg via INTRAVENOUS
  Administered 2016-04-06: 1.2 mg via INTRAVENOUS
  Administered 2016-04-06: 7.2 mg via INTRAVENOUS
  Administered 2016-04-06: 25 mg via INTRAVENOUS
  Administered 2016-04-06: 2.4 mg via INTRAVENOUS
  Administered 2016-04-06: 9.2 mg via INTRAVENOUS
  Administered 2016-04-07: 10.2 mg via INTRAVENOUS
  Administered 2016-04-07: 3 mg via INTRAVENOUS
  Administered 2016-04-07: 12:00:00 via INTRAVENOUS
  Administered 2016-04-07: 0.6 mg via INTRAVENOUS
  Administered 2016-04-07: 4.2 mg via INTRAVENOUS
  Filled 2016-03-31 (×9): qty 25

## 2016-03-31 MED ORDER — HYDROMORPHONE HCL 2 MG/ML IJ SOLN
2.0000 mg | Freq: Once | INTRAMUSCULAR | Status: AC
  Administered 2016-03-31: 2 mg via INTRAVENOUS
  Filled 2016-03-31: qty 1

## 2016-03-31 MED ORDER — DULOXETINE HCL 30 MG PO CPEP
30.0000 mg | ORAL_CAPSULE | Freq: Every day | ORAL | Status: DC
  Administered 2016-03-31 – 2016-04-07 (×8): 30 mg via ORAL
  Filled 2016-03-31 (×8): qty 1

## 2016-03-31 MED ORDER — KETOROLAC TROMETHAMINE 15 MG/ML IJ SOLN
15.0000 mg | Freq: Once | INTRAMUSCULAR | Status: AC
  Administered 2016-03-31: 15 mg via INTRAVENOUS
  Filled 2016-03-31: qty 1

## 2016-03-31 MED ORDER — ONDANSETRON 8 MG PO TBDP: 8.0000 mg | ORAL_TABLET | Freq: Once | ORAL | Status: DC

## 2016-03-31 MED ORDER — POTASSIUM CHLORIDE CRYS ER 20 MEQ PO TBCR
40.0000 meq | EXTENDED_RELEASE_TABLET | Freq: Two times a day (BID) | ORAL | Status: DC
  Administered 2016-03-31 – 2016-04-07 (×14): 40 meq via ORAL
  Filled 2016-03-31 (×14): qty 2

## 2016-03-31 MED ORDER — DIPHENHYDRAMINE HCL 25 MG PO CAPS
25.0000 mg | ORAL_CAPSULE | Freq: Once | ORAL | Status: AC
  Administered 2016-03-31: 25 mg via ORAL
  Filled 2016-03-31: qty 1

## 2016-03-31 MED ORDER — ONDANSETRON HCL 4 MG/2ML IJ SOLN: 4.0000 mg | Freq: Four times a day (QID) | INTRAMUSCULAR | Status: DC | PRN

## 2016-03-31 MED ORDER — SODIUM CHLORIDE 0.45 % IV SOLN
INTRAVENOUS | Status: DC
  Administered 2016-03-31 – 2016-04-05 (×8): via INTRAVENOUS

## 2016-03-31 MED ORDER — POLYETHYLENE GLYCOL 3350 17 G PO PACK
17.0000 g | PACK | Freq: Every day | ORAL | Status: DC | PRN
  Administered 2016-04-02 – 2016-04-04 (×2): 17 g via ORAL
  Filled 2016-03-31 (×2): qty 1

## 2016-03-31 MED ORDER — KETOROLAC TROMETHAMINE 30 MG/ML IJ SOLN
30.0000 mg | Freq: Four times a day (QID) | INTRAMUSCULAR | Status: AC
  Administered 2016-03-31 – 2016-04-05 (×20): 30 mg via INTRAVENOUS
  Filled 2016-03-31 (×20): qty 1

## 2016-03-31 MED ORDER — DIPHENHYDRAMINE HCL 50 MG/ML IJ SOLN
25.0000 mg | INTRAMUSCULAR | Status: DC | PRN
  Filled 2016-03-31: qty 0.5

## 2016-03-31 MED ORDER — HYDROXYUREA 500 MG PO CAPS
1000.0000 mg | ORAL_CAPSULE | Freq: Every day | ORAL | Status: DC
  Administered 2016-04-01 – 2016-04-03 (×3): 1000 mg via ORAL
  Filled 2016-03-31 (×3): qty 2

## 2016-03-31 MED ORDER — SODIUM CHLORIDE 0.9% FLUSH: 9.0000 mL | INTRAVENOUS | Status: DC | PRN

## 2016-03-31 NOTE — H&P (Signed)
Hospital Admission Note Date: 03/31/2016  Patient name: [name] Medical record number: [id number] Date of birth: 06-22-1990 Age: 25 y.o. Gender: female PCP: Angelica Chessman, MD  Attending physician: [name], MD  Chief Complaint:Pain consistent with Sickle Cell Crisis x 3 days.  History of Present Illness:This is an opiate tolerant patient with Hb SS who has been experiencing post-partum depression and per patient has had no psychotherapy since delivering her baby. She states that she has been having suicidal ideations intermittently- last time this morning and states that she has plan. Pt states that she started snorting cocaine for the last 2 weeks hoping that it would give her the courage to end her misery. She feels very despondent and states that she has no one in her corner and feels that she is unable to get out of the spiral of being unable to go to school or ger a job.With regard to her SCD, she states that she has been having pain all over mostly her back,arms and legs currently at an intensity of 10/10. She denies any fevers, chills vomiting or diarrhea. She is very teary and expresses hopelessness.  Scheduled Meds: . DULoxetine  30 mg Oral Daily  . enoxaparin (LOVENOX) injection  40 mg Subcutaneous Q24H  . HYDROmorphone   Intravenous Q4H  . hydroxyurea  1,000 mg Oral Daily  . ketorolac  30 mg Intravenous Q6H  . morphine  30 mg Oral Q12H  . senna-docusate  1 tablet Oral BID   Continuous Infusions: . sodium chloride     PRN Meds:.diphenhydrAMINE **OR** diphenhydrAMINE (BENADRYL) IVPB(SICKLE CELL ONLY), naloxone **AND** sodium chloride flush, ondansetron (ZOFRAN) IV, polyethylene glycol Allergies: Food and Latex Past Medical History:  Diagnosis Date  . Blood transfusion    "lots"  . Blood transfusion without reported diagnosis   . Chronic back pain    "very severe; have knot in my back; from tight muscle; take RX and exercise for it"  . Depression  01/06/2011  . Exertional dyspnea    "sometimes"  . Genital HSV   . GERD (gastroesophageal reflux disease) 02/17/2011  . Migraines 11/08/11   "@ least twice/month"  . 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  . Mood swings ([place]) 11/08/11   "I go back and forth; real bad"  . Sickle cell anemia ([name])   . Sickle cell anemia with crisis ([place])   . Trichotillomania    h/o   Past Surgical History:  Procedure Laterality Date  . CHOLECYSTECTOMY  05/2010  . DILATION AND CURETTAGE OF UTERUS  02/20/11   S/P miscarriage  . IR GENERIC HISTORICAL  12/23/2015   IR FLUORO GUIDE CV LINE RIGHT 12/23/2015 Jacqulynn Cadet, MD [facility]INTER[facility]  . IR GENERIC HISTORICAL  12/23/2015   IR US GUIDE VASC ACCESS RIGHT 12/23/2015 Jacqulynn Cadet, MD [facility]   Family History  Problem Relation Age of Onset  . Sickle cell trait Mother   . Sickle cell trait Father   . Diabetes Maternal Grandmother   . Diabetes Paternal Grandmother   . Hypertension Paternal Grandmother   . Diabetes Maternal Grandfather    Social History   Social History  . Marital status: Single    Spouse name: N/A  . Number of children: N/A  . Years of education: N/A   Occupational History  . Not on file.   Social History Main Topics  . Smoking status: Former Smoker    Packs/day: 0.25    Years: 1.00    Types:  Cigarettes    Quit date: 03/25/2013  . Smokeless tobacco: Never Used  . Alcohol use No     Comment: pt states she quit marijuan in May 2013. Rare ETOH, + cigarettes.  She is enrolled in school  . Drug use:     Types: Other-see comments, Cocaine  . Sexual activity: Yes    Birth control/ protection: None     Comment: last sex Jul 11 2015   Other Topics Concern  . 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 Physical Exam: No intake or output data in the 24 hours ending 03/31/16 1638 General: Alert, awake, oriented x3, in no acute distress. Crying and distraught. Disheveled appearing.   HEENT: [place]/AT PEERL, EOMI, anciteric. Lower lids puffy. Neck: Trachea midline,  no masses, no thyromegal,y no JVD, no carotid bruit OROPHARYNX:  Moist, No exudate/ erythema/lesions.  Heart: Regular rate and rhythm, without murmurs, rubs, gallops, PMI non-displaced, no heaves or thrills on palpation.  Lungs: Clear to auscultation, no wheezing or rhonchi noted. No increased vocal fremitus resonant to percussion  Abdomen: Soft, nontender, nondistended, positive bowel sounds, no masses no hepatosplenomegaly noted. Neuro: No focal neurological deficits noted cranial nerves II through XII grossly intact. Strength at baseline in bilateral upper and lower extremities. Musculoskeletal: No warmth swelling or erythema around joints, no spinal tenderness noted. Psychiatric: Patient alert and oriented x3, distraught and crying.   Lab results:  Recent Labs  03/31/16 1106  NA 140  K 3.1*  CL 108  CO2 24  GLUCOSE 89  BUN <5*  CREATININE 0.73  CALCIUM 8.5*   No results for input(s): AST, ALT, ALKPHOS, BILITOT, PROT, ALBUMIN in the last 72 hours. No results for input(s): LIPASE, AMYLASE in the last 72 hours.  Recent Labs  03/31/16 1106  WBC 9.8  NEUTROABS 5.6  HGB 7.4*  HCT 21.3*  MCV 83.9  PLT 440*   No results for input(s): CKTOTAL, CKMB, CKMBINDEX, TROPONINI in the last 72 hours. Invalid input(s): POCBNP No results for input(s): DDIMER in the last 72 hours. No results for input(s): HGBA1C in the last 72 hours. No results for input(s): CHOL, HDL, LDLCALC, TRIG, CHOLHDL, LDLDIRECT in the last 72 hours. No results for input(s): TSH, T4TOTAL, T3FREE, THYROIDAB in the last 72 hours.  Invalid input(s): FREET3  Recent Labs  03/31/16 1106  RETICCTPCT 13.5*   Imaging results:  No results  found. Other results:   Assessment and Plan:  1. Hb SS with crisis: Pt is certainly opiate tolerant considering her cocaine use in addition to her pain medications. Start Dilaudid PCA, Toradol and IVF.  2. Suicidal Ideations: Will recommend a sitter for safety. Psychiatric consult and SW consult.  3. Hypokalemia: Replace orally 4. Anemia of Chronic Disease: Hb stable. 5. Substance Abuse: This is a new habit for this patient  which appears to be fueled by depression. She will likely benefit from Suboxone therapy for both pain and risk of addiction. Will discuss with psychiatry.  In excess of 60 minutes spent during this visit. Greater than 50 % in face to face contact with the patient for assessment, counseling and coordination of care.  Fadil Macmaster A. 03/31/2016, 4:38 PM

## 2016-03-31 NOTE — Discharge Instructions (Signed)
Please read attached information. If you experience any new or worsening signs or symptoms please return to the emergency room for evaluation. Please follow-up with your primary care provider or specialist as discussed.  °

## 2016-03-31 NOTE — Progress Notes (Signed)
   03/31/16 1800  Clinical Encounter Type  Visited With Patient  Visit Type Psychological support  Counseling intern visited with patient at chaplain's recommendation. Patient was very upset and  tearful when counselor arrived. Patient stated that she had not eaten or slept for several days. She expressed feeling that she had let herself and others down after having done well for the past couple of months. Patient expressed that she feels spiritually attacked, as she had begun attending church more frequently. Patient indicated that while she is grateful for the support she receives from family and friends around caring for her two young children, she does not feel supported in fulfilling her needs and in pursuing her life goals. She expressed feeling constrained by having to stay at home most of the time due to caring for her children or to health concerns.  Counselor affirmed patient's need to feel supported and acknowledged her ability to recognize her current state as a setback that she can recover from. Patient accepted counselor's offer to check in tomorrow to help her begin thinking about how to get back on track.   Lamount Cohen, Counseling Intern Department for Spiritual Care and [facility] Supervisor- Scientist, research (physical sciences)

## 2016-03-31 NOTE — ED Triage Notes (Signed)
Patient started having pain all over yesterday due to her sickle cell however states she has been using cocaine everyday this week and has not been to sleep in 3 days.

## 2016-03-31 NOTE — ED Provider Notes (Signed)
[facility] DEPT Provider Note   CSN: [id number] Arrival date & time: 03/31/16  0911     History   Chief Complaint Chief Complaint  Patient presents with  . Sickle Cell Pain Crisis  . cocain abuse    HPI [name] is a 25 y.o. female.  HPI   25 year old female presents today with sickle cell pain crisis. Patient reports symptoms started yesterday with diffuse body aches. She reports pain to her lower extremities upper extremities and back. She reports the presentation is typical of previous episodes, and notes that she has been using cocaine to help alleviate her symptoms. She reports most recent dose of cocaine was this morning. Patient notes she's also used oxycodone 20 mg at home with no significant improvement in her symptoms. Patient denies any fever, chest pain, neck stiffness, headache,  or shortness of breath.     Past Medical History:  Diagnosis Date  . Blood transfusion    "lots"  . Blood transfusion without reported diagnosis   . Chronic back pain    "very severe; have knot in my back; from tight muscle; take RX and exercise for it"  . Depression 01/06/2011  . Exertional dyspnea    "sometimes"  . Genital HSV   . GERD (gastroesophageal reflux disease) 02/17/2011  . Migraines 11/08/11   "@ least twice/month"  . 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  . Mood swings ([place]) 11/08/11   "I go back and forth; real bad"  . Sickle cell anemia ([name])   . Sickle cell anemia with crisis ([place])   . Trichotillomania    h/o    Patient Active Problem List   Diagnosis Date Noted  . Syncope   . Episode of recurrent major depressive disorder ([place])   . Fall during current hospitalization   . 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 Sickle cell anemia with pain ([place]) 02/16/2016  . Chronic back pain   . Sickle cell anemia with crisis ([place]) 01/05/2016  . Nausea & vomiting 12/27/2015  . Sickle cell crisis ([place]) 11/09/2015  . Sepsis ([place]) 11/09/2015    . Thrombocytosis ([place]) 11/09/2015  . Vaginal bleeding 11/09/2015  . Hyperbilirubinemia 11/09/2015  . Pyrexia   . Vaginal delivery 10/31/2015  . Sickle cell pain crisis ([place]) 10/26/2015  . Positive GBS test 10/18/2015  . Narcotic dependence ([place]) 10/06/2015  . 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 Nausea and vomiting 09/07/2015  . High risk for intrapartum complications, antepartum 08/12/2015  . Chronic pain 08/04/2015  . Cocaine use 07/24/2015  . Maternal sickle cell anemia affecting pregnancy, antepartum ([place]) 07/14/2015  . Anemia of chronic disease   . Hypokalemia 10/29/2014  . Hb-SS disease without crisis ([place]) 02/26/2013  . Migraines 11/08/2011  . GERD (gastroesophageal reflux disease) 02/17/2011  . 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 Sickle cell disease ([place]) 01/08/2009  . 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 CHOLECYSTECTOMY  05/2010  . DILATION AND CURETTAGE OF UTERUS  02/20/11   S/P miscarriage  . IR GENERIC HISTORICAL  12/23/2015   IR FLUORO GUIDE CV LINE RIGHT 12/23/2015 Jacqulynn Cadet, MD [facility]INTER[facility] RAD  . IR GENERIC HISTORICAL  12/23/2015   IR US GUIDE VASC ACCESS RIGHT 12/23/2015 Jacqulynn Cadet, MD [facility]    OB History    Gravida Para Term Preterm AB Living   3 2 2  1 2   SAB TAB Ectopic Multiple Live Births   1     0 2      Obstetric Comments   Miscarried in October 2012 at about 7 weeks       Home Medications    Prior to Admission medications   Medication Sig Start Date End Date Taking? Authorizing Provider  aspirin-acetaminophen-caffeine (EXCEDRIN MIGRAINE) [phone] MG tablet Take 2 tablets by mouth every 6 (six) hours as needed for headache.    Yes Historical Provider, MD  DULoxetine (CYMBALTA) 30 MG capsule Take 1 capsule (30 mg total) by mouth daily. 03/04/16  Yes [name], MD   hydroxyurea (HYDREA) 500 MG capsule Take 2 capsules (1,000 mg total) by mouth daily. May take with food to minimize GI side effects. Patient taking differently: Take 500 mg by mouth 2 (two) times daily. May take with food to minimize GI side effects. 03/14/16  Yes Tresa Garter, MD  ibuprofen (ADVIL,MOTRIN) 600 MG tablet Take 1 tablet (600 mg total) by mouth every 6 (six) hours as needed for mild pain or moderate pain. 03/14/16  Yes Olugbemiga Essie Christine, MD  morphine (MS CONTIN) 30 MG 12 hr tablet Take 1 tablet (30 mg total) by mouth every 12 (twelve) hours. 03/07/16  Yes Olugbemiga Essie Christine, MD  oxyCODONE (ROXICODONE) 15 MG immediate release tablet Take 1 tablet (15 mg total) by mouth every 4 (four) hours as needed for pain. 03/24/16 04/08/16 Yes Tresa Garter, MD  cyclobenzaprine (FLEXERIL) 10 MG tablet Take 1 tablet (10 mg total) by mouth 3 (three) times daily as needed for muscle spasms. Patient not taking: Reported on 03/31/2016 02/09/16   Tresa Garter, MD    Family History Family History  Problem Relation Age of Onset  . Sickle cell trait Mother   . Sickle cell trait Father   . Diabetes Maternal Grandmother   . Diabetes Paternal Grandmother   . Hypertension Paternal Grandmother   . Diabetes Maternal Grandfather     Social History Social History  Substance Use Topics  . Smoking status: Former Smoker    Packs/day: 0.25    Years: 1.00    Types: Cigarettes    Quit date: 03/25/2013  . Smokeless tobacco: Never Used  . Alcohol use No     Comment: pt states she quit marijuan in May 2013. Rare ETOH, + cigarettes.  She is enrolled in school     Allergies   Food and Latex   Review of Systems Review of Systems  All other systems reviewed and are negative.   Physical Exam Updated Vital Signs BP 125/67 (BP Location: Left Arm)   Pulse 104   Temp 98 F (36.7 C) (Oral)   Resp 16   Ht 5\' 2"  (1.575 m)   Wt 79.4 kg   LMP 12/15/2015 (Approximate)   SpO2 99%    BMI 32.01 kg/m   Physical Exam  Constitutional: She is oriented to person, place, and time. She appears well-developed and well-nourished.  HENT:  Head: Normocephalic and atraumatic.  Eyes: Conjunctivae and EOM are normal. Pupils are equal, round, and reactive to light. Right eye exhibits no discharge. Left eye exhibits no discharge. No scleral icterus.  Neck: Normal range of motion. No JVD present.  Full active pain free range of motion of the neck; supple  Cardiovascular: Normal rate, regular rhythm, normal heart sounds and intact distal pulses.  Exam reveals no gallop and no friction rub.   No murmur heard. Pulmonary/Chest:  Effort normal and breath sounds normal. No stridor. No respiratory distress. She has no wheezes. She has no rales. She exhibits no tenderness.  Nontender to palpation  Abdominal: Soft. She exhibits no distension and no mass. There is no tenderness. There is no rebound and no guarding.  Musculoskeletal: Normal range of motion.  No obvious swelling, warmth touch, signs of infection to the upper lower extremities chest back or abdomen. Muscular compartments are soft, joints are supple with no swelling, warmth to touch, or decreased range of motion  TTP to lower extremities bilateral, TTP of back   Lymphadenopathy:    She has no cervical adenopathy.  Neurological: She is alert and oriented to person, place, and time.  Skin: Skin is warm and dry. No rash noted. She is not diaphoretic. No erythema. No pallor.  Psychiatric: She has a normal mood and affect. Her behavior is normal. Judgment and thought content normal.  Nursing note and vitals reviewed.    ED Treatments / Results  Labs (all labs ordered are listed, but only abnormal results are displayed) Labs Reviewed  CBC WITH DIFFERENTIAL/PLATELET - Abnormal; Notable for the following:       Result Value   RBC 2.54 (*)    Hemoglobin 7.4 (*)    HCT 21.3 (*)    RDW 17.9 (*)    Platelets 440 (*)    Monocytes  Absolute 1.5 (*)    All other components within normal limits  BASIC METABOLIC PANEL - Abnormal; Notable for the following:    Potassium 3.1 (*)    BUN <5 (*)    Calcium 8.5 (*)    All other components within normal limits  RETICULOCYTES - Abnormal; Notable for the following:    Retic Ct Pct 13.5 (*)    RBC. 2.54 (*)    Retic Count, Manual 342.9 (*)    All other components within normal limits  RAPID URINE DRUG SCREEN, HOSP PERFORMED    EKG  EKG Interpretation None       Radiology No results found.  Procedures Procedures (including critical care time)  Medications Ordered in ED Medications  0.45 % sodium chloride infusion ( Intravenous New Bag/Given 03/31/16 1104)  HYDROmorphone (DILAUDID) injection 2 mg (2 mg Intravenous Given 03/31/16 1103)  diphenhydrAMINE (BENADRYL) capsule 25 mg (25 mg Oral Given 03/31/16 1103)  ketorolac (TORADOL) 15 MG/ML injection 15 mg (15 mg Intravenous Given 03/31/16 1103)  HYDROmorphone (DILAUDID) injection 2 mg (2 mg Intravenous Given 03/31/16 1303)  HYDROmorphone (DILAUDID) injection 2 mg (2 mg Intravenous Given 03/31/16 1410)  diphenhydrAMINE (BENADRYL) capsule 50 mg (50 mg Oral Given 03/31/16 1410)  HYDROmorphone (DILAUDID) injection 2 mg (2 mg Intravenous Given 03/31/16 1545)     Initial Impression / Assessment and Plan / ED Course  I have reviewed the triage vital signs and the nursing notes.  Pertinent labs & imaging results that were available during my care of the patient were reviewed by me and considered in my medical decision making (see chart for details).  Clinical Course      Final Clinical Impressions(s) / ED Diagnoses   Final diagnoses:  Sickle cell pain crisis ([place])    Labs: CBC, BMP, reticulocytes   Imaging:  Consults: Sickle cell  Therapeutics:Dilaudid, Benadryl, Toradol, half-normal saline  Discharge Meds:   Assessment/Plan:  25 year old female presents today with typical sickle cell pain crisis.  Patient's labs were ordered and pain medication ordered. Spoke with sickle cell clinic, with likely transfer over to clinic if labs  return. Significant delay in laboratory results, unable to make contact with sickle cell clinic to transfer patient denies this was lunchtime. He was able to speak with the clinic after lunch, he indicated he was too late for transfer at this time. Patient will be managed here in the ED.  4:23 PM   Recheck the patient shows that she has unhooked herself from the monitor and is sleeping in exam bed. She was woken up and informed that she needed to keep the monitor on as this is very important for her safety while giving her pain medication. Patient reports dramatic improvement in her symptoms.   Upon reevaluation patient is sobbing, reporting that her pain return. Patient was offered more pain medication here in the ED, she reports that she will need hospital admission as her pain is not improving. This is reasonable at this time, sickle cell service consult and who agreed to hospital admission.  Patient has no acute findings on my physical exam for laboratory analysis there would require continued evaluation or management in the ED setting. She is stable for hospital admission.         New Prescriptions Current Discharge Medication List       Okey Regal, PA-C 03/31/16 [place], MD 04/02/16 1118

## 2016-03-31 NOTE — Progress Notes (Signed)
Received referral from RN.  Pt has had previous contact with spiritual care for support around sickle cell and prior maternity admissions.  Discussed patient needs with RN.   Chaplain and master's counseling intern with Spiritual care will follow for emotional and spiritual support during inpatient hospitalization.

## 2016-04-01 DIAGNOSIS — Z79899 Other long term (current) drug therapy: Secondary | ICD-10-CM | POA: Diagnosis not present

## 2016-04-01 DIAGNOSIS — F329 Major depressive disorder, single episode, unspecified: Secondary | ICD-10-CM | POA: Diagnosis not present

## 2016-04-01 DIAGNOSIS — Z91018 Allergy to other foods: Secondary | ICD-10-CM | POA: Diagnosis not present

## 2016-04-01 DIAGNOSIS — D638 Anemia in other chronic diseases classified elsewhere: Secondary | ICD-10-CM | POA: Diagnosis not present

## 2016-04-01 DIAGNOSIS — D62 Acute posthemorrhagic anemia: Secondary | ICD-10-CM | POA: Diagnosis not present

## 2016-04-01 DIAGNOSIS — R45851 Suicidal ideations: Secondary | ICD-10-CM | POA: Diagnosis not present

## 2016-04-01 DIAGNOSIS — F1410 Cocaine abuse, uncomplicated: Secondary | ICD-10-CM | POA: Diagnosis not present

## 2016-04-01 DIAGNOSIS — Z91040 Latex allergy status: Secondary | ICD-10-CM

## 2016-04-01 DIAGNOSIS — Z833 Family history of diabetes mellitus: Secondary | ICD-10-CM | POA: Diagnosis not present

## 2016-04-01 DIAGNOSIS — Z818 Family history of other mental and behavioral disorders: Secondary | ICD-10-CM | POA: Diagnosis not present

## 2016-04-01 DIAGNOSIS — D5700 Hb-SS disease with crisis, unspecified: Secondary | ICD-10-CM | POA: Diagnosis not present

## 2016-04-01 DIAGNOSIS — Z8249 Family history of ischemic heart disease and other diseases of the circulatory system: Secondary | ICD-10-CM | POA: Diagnosis not present

## 2016-04-01 DIAGNOSIS — M549 Dorsalgia, unspecified: Secondary | ICD-10-CM | POA: Diagnosis not present

## 2016-04-01 DIAGNOSIS — Z87891 Personal history of nicotine dependence: Secondary | ICD-10-CM | POA: Diagnosis not present

## 2016-04-01 MED ORDER — HYDROMORPHONE HCL 2 MG/ML IJ SOLN
2.0000 mg | INTRAMUSCULAR | Status: DC
  Administered 2016-04-01 – 2016-04-04 (×36): 2 mg via INTRAVENOUS
  Filled 2016-04-01 (×36): qty 1
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
## 2016-04-01 NOTE — Progress Notes (Signed)
LCSWA received call from [facility] and reported requested information.   Kathrin Greathouse, Latanya Presser, MSW Clinical Social Worker 5E and Psychiatric Service Line [phone] 04/01/2016  5:52 PM

## 2016-04-01 NOTE — Progress Notes (Signed)
   04/01/16 1500  Clinical Encounter Type  Visited With Patient  Visit Type Initial;Psychological support;Spiritual support  Referral From Nurse  Consult/Referral To Chaplain  Spiritual Encounters  Spiritual Needs Emotional;Other (Comment);Prayer (Pastoral Conversation/Support)  Stress Factors  Patient Stress Factors Other (Comment) (Not able to assess )   I visited with the patient per referral by the nurse for prayer. The patient was dozing in and out throughout our entire visit. [name] could not lay still. She stated that she would like prayer, but could not stay awake long enough to pray with me. I prayed at the patient's bedside. I will return at a later time when the patient is able to have a conversation.   Please, contact Spiritual Care for further assistance.   [place] M.Div.
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
## 2016-04-01 NOTE — Consult Note (Signed)
[facility] Face-to-Face Psychiatry Consult   Reason for Consult:  Depression secondary to substance abuse vs post partum Referring Physician:  Dr. Zigmund Daniel Patient Identification: [name] MRN:  [id number] Principal Diagnosis: Major depression, chronic Diagnosis:   Patient Active Problem List   Diagnosis Date Noted  . Syncope [R55]   . Episode of recurrent major depressive disorder ([place]) [F33.9]   . Fall during current hospitalization [W19.XXXA, Y92.239]   . 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 Sickle cell anemia with pain ([place]) [D57.00] 02/16/2016  . Chronic back pain [M54.9, G89.29]   . Sickle cell anemia with crisis ([place]) [D57.00] 01/05/2016  . Nausea & vomiting [R11.2] 12/27/2015  . Sickle cell crisis ([place]) [D57.00] 11/09/2015  . Sepsis ([place]) [A41.9] 11/09/2015  . Thrombocytosis ([place]) [D47.3] 11/09/2015  . Vaginal bleeding [N93.9] 11/09/2015  . Hyperbilirubinemia [E80.6] 11/09/2015  . Pyrexia [R50.9]   . Vaginal delivery [O80] 10/31/2015  . Sickle cell pain crisis ([place]) [D57.00] 10/26/2015  . Positive GBS test [B95.1] 10/18/2015  . Narcotic dependence ([place]) [F11.20] 10/06/2015  . Herpes simplex type 2 (HSV-2) infection affecting pregnancy, antepartum [O98.519, B00.9] 09/26/2015  . Maternal substance abuse, antepartum [IMO0002] 09/26/2015  . Nausea and vomiting [R11.2] 09/07/2015  . High risk for intrapartum complications, antepartum [O09.90] 08/12/2015  . Chronic pain [G89.29] 08/04/2015  . Cocaine use [F14.10] 07/24/2015  . Maternal sickle cell anemia affecting pregnancy, antepartum ([place]) [O99.019, D57.1] 07/14/2015  . Anemia of chronic disease [D63.8]   . Hypokalemia [E87.6] 10/29/2014  . Hb-SS disease without crisis ([place]) [D57.1] 02/26/2013  . Migraines [G43.909] 11/08/2011  . GERD (gastroesophageal reflux disease) [K21.9] 02/17/2011  . Major depression, chronic [F32.9] 01/06/2011  . Sickle cell disease ([place]) [D57.1] 01/08/2009  . 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 female patient admitted with sickle cell crisis, substance abuse and post partum depression.  HPI: [name] is a 25 years old female admitted to [facility] with the sickle cell crisis, chronic pain. Patient seen, chart reviewed and case discussed with Dr. Zigmund Daniel and LCSW. Patient case worker/friend from sickle cell service line being at bedside and patient requested her to stay in the room during this evaluation. Patient has been somewhat sedated with the medication and frequently dozing off every 2 minutes during this evaluation so that  believe the information obtained 2 days not going to be reliable. Patient endorses having it 2 young children about 61 months old and 49 months old baby. Patient reported the father of the baby has been incarcerated and she has been struggling with the supporting date needs and paying rent for the house. Patient is also reported has few family members and friends she has been caring for the child device been hospitalized. Patient endorses postpartum depression and not receiving outpatient psychiatric services a constant services. Patient also endorses substance abuse on and off. He should urine drug screen is positive for amphetamines, opiates, and cocaine in her previous hospitalization in March, June and August 2017. Patient to urine drug screen for this admission was still pending. Patient endorses suicidal thoughts but no intention or plans. Patient is currently on suicide watch which patient has been disagree with that. Patient cannot contract reliably during this evaluation so recommending evaluation when patient is able to participate without drowsiness or dozing off.  Medical history: This is an opiate tolerant patient with Hb SS who has been  experiencing post-partum depression and per patient has had no psychotherapy since delivering  her baby. She states that she has been having suicidal ideations intermittently- last time this morning and states that she has plan. Pt states that she started snorting cocaine for the last 2 weeks hoping that it would give her the courage to end her misery. She feels very despondent and states that she has no one in her corner and feels that she is unable to get out of the spiral of being unable to go to school or ger a job.With regard to her SCD, she states that she has been having pain all over mostly her back,arms and legs currently at an intensity of 10/10. She denies any fevers, chills vomiting or diarrhea. She is very teary and expresses hopelessness.  Past Psychiatric History: No acute psych admissions.  Risk to Self: Is patient at risk for suicide?: No Risk to Others:   Prior Inpatient Therapy:   Prior Outpatient Therapy:    Past Medical History:  Past Medical History:  Diagnosis Date  . Blood transfusion    "lots"  . Blood transfusion without reported diagnosis   . Chronic back pain    "very severe; have knot in my back; from tight muscle; take RX and exercise for it"  . Depression 01/06/2011  . Exertional dyspnea    "sometimes"  . Genital HSV   . GERD (gastroesophageal reflux disease) 02/17/2011  . Migraines 11/08/11   "@ least twice/month"  . 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  . Mood swings ([place]) 11/08/11   "I go back and forth; real bad"  . Sickle cell anemia ([name])   . Sickle cell anemia with crisis ([place])   . Trichotillomania    h/o    Past Surgical History:  Procedure Laterality Date  . CHOLECYSTECTOMY  05/2010  . DILATION AND CURETTAGE OF UTERUS  02/20/11   S/P miscarriage  . IR GENERIC HISTORICAL  12/23/2015   IR FLUORO GUIDE CV LINE RIGHT 12/23/2015 Jacqulynn Cadet, MD [facility]-INTERV RAD  . IR GENERIC HISTORICAL  12/23/2015   IR US GUIDE VASC ACCESS RIGHT 12/23/2015 Jacqulynn Cadet, MD [facility]-INTERV RAD   Family History:  Family History  Problem Relation Age  of Onset  . Sickle cell trait Mother   . Sickle cell trait Father   . Diabetes Maternal Grandmother   . Diabetes Paternal Grandmother   . Hypertension Paternal Grandmother   . Diabetes Maternal Grandfather    Family Psychiatric  History: Noncontributory Social History:  History  Alcohol Use No    Comment: pt states she quit marijuan in May 2013. Rare ETOH, + cigarettes.  She is enrolled in school     History  Drug Use  . Types: Other-see comments, Cocaine    Social History   Social History  . Marital status: Single    Spouse name: N/A  . Number of children: N/A  . Years of education: N/A   Social History Main Topics  . Smoking status: Former Smoker    Packs/day: 0.25    Years: 1.00    Types: Cigarettes    Quit date: 03/25/2013  . Smokeless tobacco: Never Used  . Alcohol use No     Comment: pt states she quit marijuan in May 2013. Rare ETOH, + cigarettes.  She is enrolled in school  . Drug use:     Types: Other-see comments, Cocaine  . Sexual activity: Yes    Birth control/ protection: None  Comment: last sex Jul 11 2015   Other Topics Concern  . 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 Food Hives and Other (See Comments)    Pt states that she is allergic to carrots.   . Latex Rash    Labs:  Results for orders placed or performed during the hospital encounter of 03/31/16 (from the past 48 hour(s))  CBC with Differential     Status: Abnormal   Collection Time: 03/31/16 11:06 AM  Result Value Ref Range   WBC 9.8 4.0 - 10.5 K/uL   RBC 2.54 (L) 3.87 - 5.11 MIL/uL   Hemoglobin 7.4 (L) 12.0 - 15.0 g/dL   HCT 21.3 (L) 36.0 - 46.0 %   MCV 83.9 78.0 - 100.0 fL   MCH 29.1 26.0 - 34.0 pg   MCHC 34.7 30.0 - 36.0 g/dL   RDW 17.9 (H) 11.5 - 15.5 %   Platelets 440 (H) 150 - 400 K/uL    Neutrophils Relative % 58 %   Lymphocytes Relative 25 %   Monocytes Relative 15 %   Eosinophils Relative 1 %   Basophils Relative 1 %   Neutro Abs 5.6 1.7 - 7.7 K/uL   Lymphs Abs 2.5 0.7 - 4.0 K/uL   Monocytes Absolute 1.5 (H) 0.1 - 1.0 K/uL   Eosinophils Absolute 0.1 0.0 - 0.7 K/uL   Basophils Absolute 0.1 0.0 - 0.1 K/uL   RBC Morphology POLYCHROMASIA PRESENT     Comment: TARGET CELLS SICKLE CELLS ELLIPTOCYTES HOWELL/JOLLY BODIES   Basic metabolic panel     Status: Abnormal   Collection Time: 03/31/16 11:06 AM  Result Value Ref Range   Sodium 140 135 - 145 mmol/L   Potassium 3.1 (L) 3.5 - 5.1 mmol/L   Chloride 108 101 - 111 mmol/L   CO2 24 22 - 32 mmol/L   Glucose, Bld 89 65 - 99 mg/dL   BUN <5 (L) 6 - 20 mg/dL   Creatinine, Ser 0.73 0.44 - 1.00 mg/dL   Calcium 8.5 (L) 8.9 - 10.3 mg/dL   GFR calc non Af Amer >60 >60 mL/min   GFR calc Af Amer >60 >60 mL/min    Comment: (NOTE) The eGFR has been calculated using the CKD EPI equation. This calculation has not been validated in all clinical situations. eGFR's persistently <60 mL/min signify possible Chronic Kidney Disease.    Anion gap 8 5 - 15  Reticulocytes     Status: Abnormal   Collection Time: 03/31/16 11:06 AM  Result Value Ref Range   Retic Ct Pct 13.5 (H) 0.4 - 3.1 %   RBC. 2.54 (L) 3.87 - 5.11 MIL/uL   Retic Count, Manual 342.9 (H) 19.0 - 186.0 K/uL   *Note: Due to a large number of results and/or encounters for the requested time period, some results have not been displayed. A complete set of results can be found in Results Review.    Current Facility-Administered Medications  Medication Dose Route Frequency Provider Last Rate Last Dose  . 0.45 % sodium chloride infusion   Intravenous Continuous [name], MD 125 mL/hr at 04/01/16 [phone]    . diphenhydrAMINE (BENADRYL) capsule 25-50 mg  25-50 mg Oral Q4H PRN [name], MD       Or  .  diphenhydrAMINE (BENADRYL) 25 mg in sodium chloride 0.9 %  50 mL IVPB  25 mg Intravenous Q4H PRN [name], MD      . DULoxetine (CYMBALTA) DR capsule 30 mg  30 mg Oral Daily [name], MD   30 mg at 03/31/16 1841  . enoxaparin (LOVENOX) injection 40 mg  40 mg Subcutaneous Q24H [name], MD      . HYDROmorphone (DILAUDID) 1 mg/mL PCA injection   Intravenous Q4H [name], MD      . hydroxyurea (HYDREA) capsule 1,000 mg  1,000 mg Oral Daily [name], MD      . ketorolac (TORADOL) 30 MG/ML injection 30 mg  30 mg Intravenous Q6H [name], MD   30 mg at 04/01/16 0521  . morphine (MS CONTIN) 12 hr tablet 30 mg  30 mg Oral Q12H [name], MD   30 mg at 03/31/16 2129  . naloxone [facility]) injection 0.4 mg  0.4 mg Intravenous PRN [name], MD       And  . sodium chloride flush (NS) 0.9 % injection 9 mL  9 mL Intravenous PRN [name], MD      . ondansetron [facility]) injection 4 mg  4 mg Intravenous Q6H PRN [name], MD      . polyethylene glycol (MIRALAX / GLYCOLAX) packet 17 g  17 g Oral Daily PRN [name], MD      . potassium chloride SA (K-DUR,KLOR-CON) CR tablet 40 mEq  40 mEq Oral BID [name], MD   40 mEq at 03/31/16 2129  . senna-docusate (Senokot-S) tablet 1 tablet  1 tablet Oral BID [name], MD   1 tablet at 03/31/16 2129    Musculoskeletal: Strength & Muscle Tone: decreased Gait & Station: unable to stand Patient leans: N/A  Psychiatric Specialty Exam: Physical Exam as per history and physical   ROS generalized body pains, drowsiness but denied nausea, vomiting, abdomen pain, shortness of breath and spent.  No Fever-chills, No Headache, No changes with Vision or hearing, reports vertigo No problems swallowing food or Liquids, No Chest pain, Cough or Shortness of Breath, No Abdominal pain, No Nausea or Vommitting, Bowel movements are regular, No Blood in stool or Urine, No dysuria, No new skin rashes or  bruises, No new joints pains-aches,  No new weakness, tingling, numbness in any extremity, No recent weight gain or loss, No polyuria, polydypsia or polyphagia,   A full 10 point Review of Systems was done, except as stated above, all other Review of Systems were negative.  Blood pressure 109/64, pulse (!) 109, temperature 98.2 F (36.8 C), temperature source Oral, resp. rate 13, height 5' 2"  (1.575 m), weight 79.4 kg (175 lb), last menstrual period 12/15/2015, SpO2 93 %, not currently breastfeeding.Body mass index is 32.01 kg/m.  General Appearance: Bizarre, Disheveled and Guarded  Eye Contact:  Fair  Speech:  Clear and Coherent  Volume:  Decreased  Mood:  Depressed  Affect:  Constricted and Depressed  Thought Process:  Coherent and Goal Directed  Orientation:  Full (Time, Place, and Person)  Thought Content:  Rumination and Tangential  Suicidal Thoughts:  Yes.  without intent/plan  Homicidal Thoughts:  No  Memory:  Immediate;   Fair Recent;   Fair Remote;   Poor  Judgement:  Impaired  Insight:  Fair  Psychomotor Activity:  Decreased  Concentration:  Concentration: Fair and Attention Span: Poor  Recall:  [place] of Knowledge:  Good  Language:  Good  Akathisia:  Negative  Handed:  Right  AIMS (if indicated):     Assets:  Communication Skills Desire for Improvement Financial Resources/Insurance Housing Leisure Time Resilience  ADL's:  Impaired  Cognition:  Impaired,  Mild  Sleep:        Treatment Plan Summary: 25 years old female with sickle cell disease, polysubstance abuse and possible postpartum depression admitted for sickle cell crisis pain and also endorses symptoms of depression and suicidal thoughts without intention or plan.  Urine drug screen is pending Recommended reevaluation when patient is more appropriate for the evaluation and can't provide reliable information and able to make decisions regarding treatment needs  Continue Cymbalta 30 mg daily  which can be helpful for depression and also chronic pain secondary to sickle cell disease Safety concerns: Continue safety sitter Daily contact with patient to assess and evaluate symptoms and progress in treatment and Medication management  Disposition: Recommended psychiatric evaluation and patient is more appropriate and coherent. Supportive therapy provided about ongoing stressors.  Ambrose [place], MD 04/01/2016 9:21 AM
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
## 2016-04-02 ENCOUNTER — Inpatient Hospital Stay (HOSPITAL_COMMUNITY): Payer: Medicare Other

## 2016-04-02 DIAGNOSIS — N939 Abnormal uterine and vaginal bleeding, unspecified: Secondary | ICD-10-CM

## 2016-04-02 DIAGNOSIS — D5700 Hb-SS disease with crisis, unspecified: Secondary | ICD-10-CM | POA: Diagnosis not present

## 2016-04-02 DIAGNOSIS — D62 Acute posthemorrhagic anemia: Secondary | ICD-10-CM | POA: Diagnosis not present

## 2016-04-02 DIAGNOSIS — F329 Major depressive disorder, single episode, unspecified: Secondary | ICD-10-CM | POA: Diagnosis not present

## 2016-04-02 DIAGNOSIS — M549 Dorsalgia, unspecified: Secondary | ICD-10-CM | POA: Diagnosis not present

## 2016-04-02 DIAGNOSIS — D72829 Elevated white blood cell count, unspecified: Secondary | ICD-10-CM

## 2016-04-02 DIAGNOSIS — R45851 Suicidal ideations: Secondary | ICD-10-CM | POA: Diagnosis not present

## 2016-04-02 DIAGNOSIS — D638 Anemia in other chronic diseases classified elsewhere: Secondary | ICD-10-CM | POA: Diagnosis not present

## 2016-04-02 LAB — URINALYSIS, ROUTINE W REFLEX MICROSCOPIC
BILIRUBIN URINE: NEGATIVE
Glucose, UA: NEGATIVE mg/dL
KETONES UR: NEGATIVE mg/dL
Leukocytes, UA: NEGATIVE
NITRITE: NEGATIVE
PH: 6 (ref 5.0–8.0)
Protein, ur: NEGATIVE mg/dL
SPECIFIC GRAVITY, URINE: 1.013 (ref 1.005–1.030)

## 2016-04-02 LAB — CBC WITH DIFFERENTIAL/PLATELET
BASOS ABS: 0 10*3/uL (ref 0.0–0.1)
Basophils Relative: 0 %
EOS ABS: 0.8 10*3/uL — AB (ref 0.0–0.7)
Eosinophils Relative: 3 %
HCT: 19.3 % — ABNORMAL LOW (ref 36.0–46.0)
HEMOGLOBIN: 6.9 g/dL — AB (ref 12.0–15.0)
LYMPHS PCT: 8 %
Lymphs Abs: 2.1 10*3/uL (ref 0.7–4.0)
MCH: 30 pg (ref 26.0–34.0)
MCHC: 35.8 g/dL (ref 30.0–36.0)
MCV: 83.9 fL (ref 78.0–100.0)
MONOS PCT: 8 %
Monocytes Absolute: 2.1 10*3/uL — ABNORMAL HIGH (ref 0.1–1.0)
NEUTROS PCT: 81 %
Neutro Abs: 20.8 10*3/uL — ABNORMAL HIGH (ref 1.7–7.7)
PLATELETS: 495 10*3/uL — AB (ref 150–400)
RBC: 2.3 MIL/uL — AB (ref 3.87–5.11)
RDW: 20.4 % — ABNORMAL HIGH (ref 11.5–15.5)
WBC: 25.8 10*3/uL — AB (ref 4.0–10.5)

## 2016-04-02 LAB — RETICULOCYTES
RBC.: 2.3 MIL/uL — AB (ref 3.87–5.11)
RETIC CT PCT: 15.7 % — AB (ref 0.4–3.1)
Retic Count, Absolute: 361.1 10*3/uL — ABNORMAL HIGH (ref 19.0–186.0)

## 2016-04-02 LAB — PREGNANCY, URINE: Preg Test, Ur: NEGATIVE

## 2016-04-02 LAB — URINE MICROSCOPIC-ADD ON

## 2016-04-02 LAB — PREPARE RBC (CROSSMATCH)

## 2016-04-02 MED ORDER — SODIUM CHLORIDE 0.9 % IV SOLN: Freq: Once | INTRAVENOUS | Status: DC
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
## 2016-04-02 NOTE — Progress Notes (Signed)
CSW received phone call from [place] in regards to report made by CSW yesterday. Mr. [name] reported that he would be interviewing patient tomorrow.

## 2016-04-03 ENCOUNTER — Inpatient Hospital Stay (HOSPITAL_COMMUNITY): Payer: Medicare Other

## 2016-04-03 DIAGNOSIS — D638 Anemia in other chronic diseases classified elsewhere: Secondary | ICD-10-CM | POA: Diagnosis not present

## 2016-04-03 DIAGNOSIS — F53 Postpartum depression: Secondary | ICD-10-CM

## 2016-04-03 DIAGNOSIS — Z91018 Allergy to other foods: Secondary | ICD-10-CM

## 2016-04-03 DIAGNOSIS — Z8489 Family history of other specified conditions: Secondary | ICD-10-CM | POA: Diagnosis not present

## 2016-04-03 DIAGNOSIS — O99345 Other mental disorders complicating the puerperium: Secondary | ICD-10-CM

## 2016-04-03 DIAGNOSIS — D72829 Elevated white blood cell count, unspecified: Secondary | ICD-10-CM | POA: Diagnosis not present

## 2016-04-03 DIAGNOSIS — R45851 Suicidal ideations: Secondary | ICD-10-CM | POA: Diagnosis not present

## 2016-04-03 DIAGNOSIS — Z87891 Personal history of nicotine dependence: Secondary | ICD-10-CM

## 2016-04-03 DIAGNOSIS — D62 Acute posthemorrhagic anemia: Secondary | ICD-10-CM | POA: Diagnosis not present

## 2016-04-03 DIAGNOSIS — Z8249 Family history of ischemic heart disease and other diseases of the circulatory system: Secondary | ICD-10-CM | POA: Diagnosis not present

## 2016-04-03 DIAGNOSIS — D5700 Hb-SS disease with crisis, unspecified: Secondary | ICD-10-CM | POA: Diagnosis not present

## 2016-04-03 DIAGNOSIS — Z833 Family history of diabetes mellitus: Secondary | ICD-10-CM

## 2016-04-03 DIAGNOSIS — Z79899 Other long term (current) drug therapy: Secondary | ICD-10-CM

## 2016-04-03 DIAGNOSIS — M549 Dorsalgia, unspecified: Secondary | ICD-10-CM | POA: Diagnosis not present

## 2016-04-03 DIAGNOSIS — F329 Major depressive disorder, single episode, unspecified: Secondary | ICD-10-CM | POA: Diagnosis not present

## 2016-04-03 LAB — BASIC METABOLIC PANEL
Anion gap: 4 — ABNORMAL LOW (ref 5–15)
BUN: 7 mg/dL (ref 6–20)
CALCIUM: 8.2 mg/dL — AB (ref 8.9–10.3)
CO2: 29 mmol/L (ref 22–32)
CREATININE: 0.79 mg/dL (ref 0.44–1.00)
Chloride: 108 mmol/L (ref 101–111)
GFR calc Af Amer: >60 mL/min (ref >60)
GLUCOSE: 132 mg/dL — AB (ref 65–99)
POTASSIUM: 3.9 mmol/L (ref 3.5–5.1)
SODIUM: 141 mmol/L (ref 135–145)

## 2016-04-03 LAB — CBC WITH DIFFERENTIAL/PLATELET
Basophils Absolute: 0 10*3/uL (ref 0.0–0.1)
Basophils Relative: 0 %
EOS ABS: 1 10*3/uL — AB (ref 0.0–0.7)
EOS PCT: 5 %
HCT: 17 % — ABNORMAL LOW (ref 36.0–46.0)
Hemoglobin: 6 g/dL — CL (ref 12.0–15.0)
LYMPHS ABS: 3 10*3/uL (ref 0.7–4.0)
LYMPHS PCT: 15 %
MCH: 30.5 pg (ref 26.0–34.0)
MCHC: 35.3 g/dL (ref 30.0–36.0)
MCV: 86.3 fL (ref 78.0–100.0)
MONO ABS: 1.8 10*3/uL — AB (ref 0.1–1.0)
Monocytes Relative: 9 %
Neutro Abs: 14.2 10*3/uL — ABNORMAL HIGH (ref 1.7–7.7)
Neutrophils Relative %: 71 %
PLATELETS: 435 10*3/uL — AB (ref 150–400)
RBC: 1.97 MIL/uL — AB (ref 3.87–5.11)
RDW: 21 % — AB (ref 11.5–15.5)
WBC: 20 10*3/uL — AB (ref 4.0–10.5)

## 2016-04-03 LAB — RETICULOCYTES
RBC.: 1.97 MIL/uL — ABNORMAL LOW (ref 3.87–5.11)
RETIC CT PCT: 18.1 % — AB (ref 0.4–3.1)
Retic Count, Absolute: 356.6 10*3/uL — ABNORMAL HIGH (ref 19.0–186.0)

## 2016-04-03 MED ORDER — ALUM & MAG HYDROXIDE-SIMETH 200-200-20 MG/5ML PO SUSP: 30.0000 mL | Freq: Once | ORAL | Status: DC
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
## 2016-04-04 DIAGNOSIS — M549 Dorsalgia, unspecified: Secondary | ICD-10-CM | POA: Diagnosis not present

## 2016-04-04 DIAGNOSIS — F329 Major depressive disorder, single episode, unspecified: Secondary | ICD-10-CM | POA: Diagnosis not present

## 2016-04-04 DIAGNOSIS — D638 Anemia in other chronic diseases classified elsewhere: Secondary | ICD-10-CM | POA: Diagnosis not present

## 2016-04-04 DIAGNOSIS — R45851 Suicidal ideations: Secondary | ICD-10-CM | POA: Diagnosis not present

## 2016-04-04 DIAGNOSIS — D62 Acute posthemorrhagic anemia: Secondary | ICD-10-CM | POA: Diagnosis not present

## 2016-04-04 DIAGNOSIS — D5700 Hb-SS disease with crisis, unspecified: Secondary | ICD-10-CM | POA: Diagnosis not present

## 2016-04-04 DIAGNOSIS — F53 Puerperal psychosis: Secondary | ICD-10-CM | POA: Diagnosis not present

## 2016-04-04 LAB — CBC WITH DIFFERENTIAL/PLATELET
BAND NEUTROPHILS: 0 %
BASOS PCT: 1 %
Basophils Absolute: 0.2 10*3/uL — ABNORMAL HIGH (ref 0.0–0.1)
Blasts: 0 %
EOS PCT: 9 %
Eosinophils Absolute: 1.7 10*3/uL — ABNORMAL HIGH (ref 0.0–0.7)
HCT: 15.4 % — ABNORMAL LOW (ref 36.0–46.0)
HEMOGLOBIN: 5.4 g/dL — AB (ref 12.0–15.0)
LYMPHS ABS: 2.2 10*3/uL (ref 0.7–4.0)
LYMPHS PCT: 12 %
MCH: 30.5 pg (ref 26.0–34.0)
MCHC: 35.1 g/dL (ref 30.0–36.0)
MCV: 87 fL (ref 78.0–100.0)
MONO ABS: 0.6 10*3/uL (ref 0.1–1.0)
MONOS PCT: 3 %
Metamyelocytes Relative: 0 %
Myelocytes: 0 %
NEUTROS ABS: 14 10*3/uL — AB (ref 1.7–7.7)
NEUTROS PCT: 75 %
NRBC: 7 /100{WBCs} — AB
OTHER: 0 %
PLATELETS: 458 10*3/uL — AB (ref 150–400)
Promyelocytes Absolute: 0 %
RBC: 1.77 MIL/uL — ABNORMAL LOW (ref 3.87–5.11)
RDW: 23.5 % — AB (ref 11.5–15.5)
WBC: 18.7 10*3/uL — ABNORMAL HIGH (ref 4.0–10.5)

## 2016-04-04 LAB — RETICULOCYTES
RBC.: 1.78 MIL/uL — ABNORMAL LOW (ref 3.87–5.11)
Retic Ct Pct: >23 % — ABNORMAL HIGH (ref 0.4–3.1)

## 2016-04-04 LAB — PREPARE RBC (CROSSMATCH)

## 2016-04-04 MED ORDER — HYDROMORPHONE HCL 2 MG/ML IJ SOLN
2.0000 mg | INTRAMUSCULAR | Status: DC
  Administered 2016-04-04 – 2016-04-05 (×7): 2 mg via INTRAVENOUS
  Filled 2016-04-04 (×7): qty 1

## 2016-04-04 MED ORDER — HYDROMORPHONE HCL 2 MG/ML IJ SOLN
2.0000 mg | Freq: Once | INTRAMUSCULAR | Status: AC
  Administered 2016-04-04: 2 mg via INTRAVENOUS

## 2016-04-04 MED ORDER — MEDROXYPROGESTERONE ACETATE 10 MG PO TABS
20.0000 mg | ORAL_TABLET | Freq: Every day | ORAL | Status: DC
  Administered 2016-04-04 – 2016-04-06 (×3): 20 mg via ORAL
  Filled 2016-04-04 (×3): qty 2

## 2016-04-04 MED ORDER — SODIUM CHLORIDE 0.9 % IV SOLN
Freq: Once | INTRAVENOUS | Status: AC
  Administered 2016-04-04: 10:00:00 via INTRAVENOUS

## 2016-04-04 MED ORDER — HYDROMORPHONE BOLUS VIA INFUSION: 2.0000 mg | Freq: Once | INTRAVENOUS | Status: DC
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
## 2016-04-04 NOTE — Progress Notes (Signed)
LCSWA completed assessment with patient.  Left voicemail for  Marge Duncans at [facility] and [place], will follow up in the a.m  Kathrin Greathouse, Latanya Presser, MSW Clinical Social Worker 5E and [place] [phone] 04/04/2016  4:38 PM

## 2016-04-04 NOTE — Progress Notes (Signed)
Pt is receiving Dilaudid via PCA infusion through her port, so IV team was called to insert a peripheral IV for blood administration. Pt was not compliant with having the IV placed and IV nurse had to leave the floor due to time constraints. This RN attempted an IV stick, but was not successful. Pt's MD was notified and this RN was told to pause the PCA pump and infuse the blood.

## 2016-04-05 ENCOUNTER — Telehealth: Payer: Self-pay

## 2016-04-05 DIAGNOSIS — D638 Anemia in other chronic diseases classified elsewhere: Secondary | ICD-10-CM | POA: Diagnosis not present

## 2016-04-05 DIAGNOSIS — D62 Acute posthemorrhagic anemia: Secondary | ICD-10-CM | POA: Diagnosis not present

## 2016-04-05 DIAGNOSIS — F53 Puerperal psychosis: Secondary | ICD-10-CM | POA: Diagnosis not present

## 2016-04-05 DIAGNOSIS — M549 Dorsalgia, unspecified: Secondary | ICD-10-CM | POA: Diagnosis not present

## 2016-04-05 DIAGNOSIS — D5700 Hb-SS disease with crisis, unspecified: Secondary | ICD-10-CM | POA: Diagnosis not present

## 2016-04-05 DIAGNOSIS — F329 Major depressive disorder, single episode, unspecified: Secondary | ICD-10-CM | POA: Diagnosis not present

## 2016-04-05 DIAGNOSIS — D72829 Elevated white blood cell count, unspecified: Secondary | ICD-10-CM | POA: Diagnosis not present

## 2016-04-05 DIAGNOSIS — R45851 Suicidal ideations: Secondary | ICD-10-CM | POA: Diagnosis not present

## 2016-04-05 LAB — TYPE AND SCREEN
ABO/RH(D): B POS
ANTIBODY SCREEN: NEGATIVE
Donor AG Type: NEGATIVE
UNIT DIVISION: 0
UNIT DIVISION: 0

## 2016-04-05 MED ORDER — HYDROMORPHONE HCL 2 MG/ML IJ SOLN
2.0000 mg | INTRAMUSCULAR | Status: DC | PRN
  Administered 2016-04-05 – 2016-04-06 (×5): 2 mg via INTRAVENOUS
  Filled 2016-04-05 (×5): qty 1

## 2016-04-05 NOTE — Progress Notes (Addendum)
Confirmed appointment with Binnie Rail at  [facility] December 6 @3 :00 pm  New Appointment set for [facility] April 27, 2016 at 1:30pm Patient agreeable to above date and time.  Patient provided with copy of information.  No other needs identified at this time.   Kathrin Greathouse, Latanya Presser, MSW Clinical Social Worker 5E and Psychiatric Service Line [phone] 04/05/2016  11:40 AM
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
## 2016-04-05 NOTE — Consult Note (Signed)
   [facility] CM Inpatient Consult   04/05/2016  [name] 09/23/1990 [id number]    Spoke with [name] at bedside to discuss [facility] services. Made her aware that [facility] has been attempting to reach her prior to admission. Also discussed her involvement with [facility] [facility] in the past. States "I can use whatever help I can get". States she has been under a lot of stress lately and has states she has been suffering from postpartum depression. Endorses that she is followed by [facility] as well but could use the additional follow up post hospital discharge. Written consent obtained.   Denies having issues with transportation or with obtaining medications. [name] was engaged in conversation and states she really wants to do better. Confirmed best contact number as [phone]. Confirmed Primary Care MD as Dr. Doreene Burke.    Will request for [name] to be assigned to [place] for post discharge calls for multiple admissions. Will ask for [facility] RNCM to make referral to [facility] LCSW once contact has been made post discharge.    Marthenia Rolling, MSN-Ed, RN,BSN [facility] Liaison [phone]

## 2016-04-06 ENCOUNTER — Other Ambulatory Visit: Payer: Self-pay | Admitting: Internal Medicine

## 2016-04-06 ENCOUNTER — Other Ambulatory Visit: Payer: Self-pay | Admitting: Family Medicine

## 2016-04-06 DIAGNOSIS — F329 Major depressive disorder, single episode, unspecified: Secondary | ICD-10-CM | POA: Diagnosis not present

## 2016-04-06 DIAGNOSIS — D571 Sickle-cell disease without crisis: Secondary | ICD-10-CM

## 2016-04-06 DIAGNOSIS — D638 Anemia in other chronic diseases classified elsewhere: Secondary | ICD-10-CM | POA: Diagnosis not present

## 2016-04-06 DIAGNOSIS — R45851 Suicidal ideations: Secondary | ICD-10-CM | POA: Diagnosis not present

## 2016-04-06 DIAGNOSIS — D5700 Hb-SS disease with crisis, unspecified: Secondary | ICD-10-CM | POA: Diagnosis not present

## 2016-04-06 DIAGNOSIS — D62 Acute posthemorrhagic anemia: Secondary | ICD-10-CM | POA: Diagnosis not present

## 2016-04-06 DIAGNOSIS — M549 Dorsalgia, unspecified: Secondary | ICD-10-CM | POA: Diagnosis not present

## 2016-04-06 LAB — CBC WITH DIFFERENTIAL/PLATELET
BAND NEUTROPHILS: 0 %
BLASTS: 0 %
Basophils Absolute: 0.3 10*3/uL — ABNORMAL HIGH (ref 0.0–0.1)
Basophils Relative: 2 %
EOS ABS: 0.8 10*3/uL — AB (ref 0.0–0.7)
Eosinophils Relative: 5 %
HEMATOCRIT: 21 % — AB (ref 36.0–46.0)
HEMOGLOBIN: 7 g/dL — AB (ref 12.0–15.0)
LYMPHS PCT: 22 %
Lymphs Abs: 3.3 10*3/uL (ref 0.7–4.0)
MCH: 30.2 pg (ref 26.0–34.0)
MCHC: 33.3 g/dL (ref 30.0–36.0)
MCV: 90.5 fL (ref 78.0–100.0)
Metamyelocytes Relative: 0 %
Monocytes Absolute: 0.8 10*3/uL (ref 0.1–1.0)
Monocytes Relative: 5 %
Myelocytes: 0 %
NEUTROS PCT: 66 %
Neutro Abs: 9.9 10*3/uL — ABNORMAL HIGH (ref 1.7–7.7)
OTHER: 0 %
PROMYELOCYTES ABS: 0 %
Platelets: 519 10*3/uL — ABNORMAL HIGH (ref 150–400)
RBC: 2.32 MIL/uL — AB (ref 3.87–5.11)
RDW: 22.9 % — ABNORMAL HIGH (ref 11.5–15.5)
WBC: 15.1 10*3/uL — AB (ref 4.0–10.5)
nRBC: 0 /100 WBC

## 2016-04-06 LAB — RETICULOCYTES
RBC.: 2.32 MIL/uL — AB (ref 3.87–5.11)
Retic Ct Pct: >23 % — ABNORMAL HIGH (ref 0.4–3.1)

## 2016-04-06 MED ORDER — MORPHINE SULFATE ER 30 MG PO TBCR
30.0000 mg | EXTENDED_RELEASE_TABLET | Freq: Two times a day (BID) | ORAL | 0 refills | Status: DC
Start: 2016-04-06 — End: 2016-05-03

## 2016-04-06 MED ORDER — MEGESTROL ACETATE 40 MG PO TABS: 40.0000 mg | ORAL_TABLET | Freq: Two times a day (BID) | ORAL | Status: DC

## 2016-04-06 MED ORDER — OXYCODONE HCL 15 MG PO TABS: 15.0000 mg | ORAL_TABLET | ORAL | 0 refills | Status: DC | PRN

## 2016-04-06 MED ORDER — SODIUM CHLORIDE 0.9% FLUSH
10.0000 mL | INTRAVENOUS | Status: DC | PRN
Start: 2016-04-06 — End: 2016-04-07

## 2016-04-06 MED ORDER — OXYCODONE HCL 5 MG PO TABS
15.0000 mg | ORAL_TABLET | ORAL | Status: DC
  Administered 2016-04-06 – 2016-04-07 (×7): 15 mg via ORAL
  Filled 2016-04-06 (×7): qty 3

## 2016-04-06 MED ORDER — MEGESTROL ACETATE 40 MG PO TABS
40.0000 mg | ORAL_TABLET | Freq: Three times a day (TID) | ORAL | Status: DC
  Administered 2016-04-07: 40 mg via ORAL
  Filled 2016-04-06 (×2): qty 1

## 2016-04-06 MED ORDER — MEGESTROL ACETATE 40 MG PO TABS
40.0000 mg | ORAL_TABLET | Freq: Once | ORAL | Status: AC
Start: 2016-04-06 — End: 2016-04-06
  Administered 2016-04-06: 40 mg via ORAL
  Filled 2016-04-06: qty 1

## 2016-04-06 MED ORDER — MEGESTROL ACETATE 40 MG PO TABS: 40.0000 mg | ORAL_TABLET | Freq: Every day | ORAL | Status: DC
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
## 2016-04-06 NOTE — Telephone Encounter (Signed)
ERROR

## 2016-04-06 NOTE — Progress Notes (Signed)
   04/06/16 1700  Clinical Encounter Type  Visited With Patient  Visit Type Psychological support  Counseling intern visited with patient to provide continued emotional support. Patient was alert and oriented and was sitting up in bed when counselor arrived. Patient described at length her current and former relationships, including her relationships with her children's fathers. Patient described feeling a great deal of loss and betrayal when her son's father walked out of their lives without offering any kind of parental or emotional support. She also described feeling conflicted by her relationship with her daughter's father. She notes that he has hinted at wanting her to wait for him to be released from prison so they can begin a family. She does not share his feelings but does not want to hurt him because she feels she is the most supportive person in his life right now. She stated that the person she does want to have a future with is not accepted by her family, and she has to hide their relationship in order not to be criticized and judged.  Counselor affirmed patient's feelings of anger and rejection. Counselor noted that patient has spent a lot of time and effort considering what would make the men in her life happy but not as much working toward what would be best for her.   Lamount Cohen, Counseling Intern Department for Spiritual Care and [facility] Supervisor - Scientist, research (physical sciences)

## 2016-04-07 DIAGNOSIS — F53 Puerperal psychosis: Secondary | ICD-10-CM | POA: Diagnosis not present

## 2016-04-07 MED ORDER — MEGESTROL ACETATE 40 MG PO TABS
40.0000 mg | ORAL_TABLET | Freq: Every day | ORAL | Status: DC
Start: 2016-04-13 — End: 2016-04-20

## 2016-04-07 MED ORDER — MEGESTROL ACETATE 40 MG PO TABS: 40.0000 mg | ORAL_TABLET | Freq: Three times a day (TID) | ORAL | 0 refills | Status: DC

## 2016-04-07 MED ORDER — HEPARIN SOD (PORK) LOCK FLUSH 100 UNIT/ML IV SOLN
500.0000 [IU] | INTRAVENOUS | Status: DC | PRN
  Filled 2016-04-07: qty 5

## 2016-04-07 MED ORDER — HEPARIN SOD (PORK) LOCK FLUSH 100 UNIT/ML IV SOLN: 500.0000 [IU] | INTRAVENOUS | Status: DC

## 2016-04-07 MED ORDER — MEGESTROL ACETATE 40 MG PO TABS: 40.0000 mg | ORAL_TABLET | Freq: Two times a day (BID) | ORAL | Status: DC

## 2016-04-07 NOTE — Progress Notes (Signed)
Pt discharged home in stable condition. Discharge instructions and scripts given. Pt verbalized understanding. No immediate questions or concerns.

## 2016-04-07 NOTE — Discharge Summary (Signed)
Physician Discharge Summary  Patient ID: [name] MRN: [id number] DOB/AGE: 1991-02-08 25 y.o.  Admit date: 03/31/2016 Discharge date: 04/07/2016  Admission Diagnoses:  Discharge Diagnoses:  Principal Problem:   Post partum depression Active Problems:   Major depression, chronic   Sickle cell anemia with pain ([name])   Hb-SS disease with crisis [facility])   Discharged Condition: good  Hospital Course: Patient was admitted with sickle cell painful crisis as well as hemolytic crisis. She had vaginal loss as well. Patient was treated with provera and transfused 2 units of PRBC. Patient was also treated with IV Dilaudid PCA with Toradol and IVF. Patient transitioned to oral medications over time. She did very well and was discharged in good health.  Consults: None  Significant Diagnostic Studies: labs: CBCs and CMPs checked serially. Had decreased HB as was discharged.  Treatments: IV hydration and analgesia: Dilaudid  Discharge Exam: Blood pressure 122/62, pulse 89, temperature 98.9 F (37.2 C), temperature source Oral, resp. rate 14, height 5\' 2"  (1.575 m), weight 85 kg (187 lb 6.3 oz), last menstrual period 12/15/2015, SpO2 97 %, not currently breastfeeding. General appearance: alert, cooperative and no distress Neck: no adenopathy, no carotid bruit, no JVD, supple, symmetrical, trachea midline and thyroid not enlarged, symmetric, no tenderness/mass/nodules Back: symmetric, no curvature. ROM normal. No CVA tenderness. Resp: clear to auscultation bilaterally Chest wall: no tenderness Cardio: regular rate and rhythm, S1, S2 normal, no murmur, click, rub or gallop GI: soft, non-tender; bowel sounds normal; no masses,  no organomegaly Extremities: extremities normal, atraumatic, no cyanosis or edema Pulses: 2+ and symmetric Skin: Skin color, texture, turgor normal. No rashes or lesions Neurologic: Grossly normal  Disposition: 01-Home or Self Care  Discharge Instructions    AMB Referral to [facility]    Complete by:  As directed    Please assign to [place] for post discharge calls. Has had multiple admissions. Please assess need for [facility] LCSW involvement upon toc. Currently at [facility]. Thanks. Marthenia Rolling, MSN-Ed, RN,BSN [facility] [phone]   Reason for consult:  Please assign to [place] for post discharge calls   Expected date of contact:  1-3 days (reserved for hospital discharges)       Medication List    TAKE these medications   aspirin-acetaminophen-caffeine 250-250-65 MG tablet Commonly known as:  EXCEDRIN MIGRAINE Take 2 tablets by mouth every 6 (six) hours as needed for headache.   cyclobenzaprine 10 MG tablet Commonly known as:  FLEXERIL Take 1 tablet (10 mg total) by mouth 3 (three) times daily as needed for muscle spasms.   DULoxetine 30 MG capsule Commonly known as:  CYMBALTA Take 1 capsule (30 mg total) by mouth daily.   hydroxyurea 500 MG capsule Commonly known as:  HYDREA Take 2 capsules (1,000 mg total) by mouth daily. May take with food to minimize GI side effects. What changed:  how much to take  when to take this  additional instructions   ibuprofen 600 MG tablet Commonly known as:  ADVIL,MOTRIN Take 1 tablet (600 mg total) by mouth every 6 (six) hours as needed for mild pain or moderate pain.   megestrol 40 MG tablet Commonly known as:  MEGACE Take 1 tablet (40 mg total) by mouth 3 (three) times daily.   megestrol 40 MG tablet Commonly known as:  MEGACE Take 1 tablet (40 mg total) by mouth 2 (two) times daily. Start taking on:  04/10/2016   megestrol 40 MG tablet Commonly known  as:  MEGACE Take 1 tablet (40 mg total) by mouth daily. Start taking on:  04/13/2016   morphine 30 MG 12 hr tablet Commonly known as:  MS CONTIN Take 1 tablet (30 mg total) by mouth every 12 (twelve) hours.   oxyCODONE 15 MG immediate release tablet Commonly known as:  ROXICODONE Take 1  tablet (15 mg total) by mouth every 4 (four) hours as needed for pain.      Follow-up Information    Angelica Chessman, MD.   Specialty:  Internal Medicine Contact information: [place] [address] [phone]        [facility].   Contact information: [place] [phone]          Signed: Barbette Merino 04/07/2016, 2:28 PM   Time spent 33 minutes

## 2016-04-08 ENCOUNTER — Other Ambulatory Visit: Payer: Self-pay | Admitting: *Deleted

## 2016-04-08 NOTE — Patient Outreach (Signed)
[facility]) Care Management  04/08/2016  [name] [birth year] [id number]   New referral received from hospital liaison as member was admitted to hospital recently with complications of sickle cell.  She was discharge 11/23.  Call placed to member's listed preferred number, unable to leave a message due to voice mailbox being full.  Will await call back, if no call back will follow up next week.  Valente David, [place], MSN [place] [phone]

## 2016-04-09 NOTE — Progress Notes (Signed)
Contacted by Dr. Zigmund Daniel for additional recommendations for patient's continued vaginal bleeding. Patient was started on provera but continues to have 6-8 soaked pads daily.   Recommended megace taper which was initiated with the help of PharmD.   Recommended patient have follow up with OB/GYN after discharge for IUD placement, possible Mirena to help with vaginal bleeding.   Caren Macadam, MD, MPH, ABFM Attending [place] for [facility]

## 2016-04-11 ENCOUNTER — Other Ambulatory Visit: Payer: Self-pay

## 2016-04-11 NOTE — Patient Outreach (Addendum)
[place] [facility]) Care Management  04/11/2016  JERNI DARWIN Dec 15, 1990 [id number]  No response from patient after 3 telephone calls and letter outreach attempt.    PLAN; RNCM will refer patient to clinical case manager to close due to being unable to contact patient. (correction RNCM will notify community case manager patient will be closed to telephonic at this time due to being unable to contact.) RNCM will notify patients primary MD of closure. (correction: RNCM will notify community case manager patient will be closed to telephonic at this time due to being unable to contact.)  [name] RN,BSN,CCM [facility] Telephonic  [phone]

## 2016-04-12 ENCOUNTER — Other Ambulatory Visit: Payer: Self-pay | Admitting: *Deleted

## 2016-04-12 ENCOUNTER — Ambulatory Visit: Payer: Medicaid Other | Admitting: Internal Medicine

## 2016-04-12 NOTE — Patient Outreach (Signed)
[place]) Care Management  04/12/2016  TAJE LILL 09-13-1990 [id number]   2nd attempt made to contact member for transition of care program, unsuccessful.  HIPAA complaint voice message left.  Will await call back, if no call back, will follow up tomorrow with 3rd attempt.  Valente David, [place], MSN [place] [phone]

## 2016-04-13 ENCOUNTER — Other Ambulatory Visit: Payer: Self-pay | Admitting: *Deleted

## 2016-04-13 NOTE — Patient Outreach (Signed)
[facility]) Care Management  04/13/2016  [name] [birth year] [id number]   3rd attempt made to initiate transition of care program.  Member state that this is not a good time to talk, request that she call this care manager back within the hour.  Will await call back.  If no call back, will make final attempt to engage within the next 2 days.  Valente David, [place], MSN [place] [phone]

## 2016-04-15 ENCOUNTER — Encounter: Payer: Self-pay | Admitting: *Deleted

## 2016-04-15 ENCOUNTER — Other Ambulatory Visit: Payer: Self-pay | Admitting: *Deleted

## 2016-04-15 NOTE — Patient Outreach (Signed)
[facility]) Care Management  04/15/2016  NIKKA GRIESMER July 26, 1990 [id number]  4th attempt made to contact member and engage in [facility] services, unsuccessful.  Will send outreach letter, if no response in 10 days will close case.  Valente David, [place], MSN [place] [phone]

## 2016-04-16 IMAGING — US IR US GUIDE VASC ACCESS RIGHT
1 series · 1 of 1 positions shown · non-contrast
Comparison: none

CLINICAL DATA: Sickle cell disease

[Series 1: ir fluoro/shunt/fist · 1 of 1 slices shown]
[im 1/1]
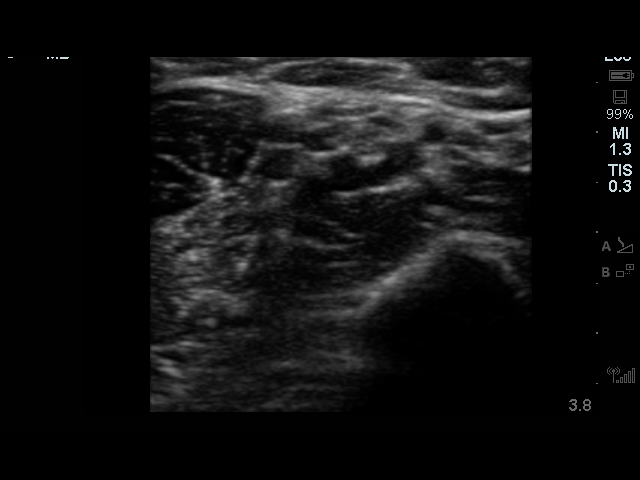

[1 of 1 positions shown; findings below may reference images not displayed]

EXAM:
RIGHT UPPER EXTREMITY PICC LINE PLACEMENT WITH ULTRASOUND AND
FLUOROSCOPIC GUIDANCE

FLUOROSCOPY TIME:  36 seconds

PROCEDURE:
The patient was advised of the possible risks and complications and
agreed to undergo the procedure. The patient was then brought to the
angiographic suite for the procedure.

The right arm was prepped with chlorhexidine, draped in the usual
sterile fashion using maximum barrier technique (cap and mask,
sterile gown, sterile gloves, large sterile sheet, hand hygiene and
cutaneous antisepsis) and infiltrated locally with 1% Lidocaine.

Ultrasound demonstrated patency of the right basilic vein, and this
was documented with an image. Under real-time ultrasound guidance,
this vein was accessed with a 21 gauge micropuncture needle and
image documentation was performed. A [DATE] wire was introduced in to
the vein. Over this, a 5 French double lumen power PICC was advanced
to the lower SVC/right atrial junction. Fluoroscopy during the
procedure and fluoro spot radiograph confirms appropriate catheter
position. The catheter was flushed and covered with a sterile
dressing.

COMPLICATIONS:
None

LENGTH:
37 cm
IMPRESSION: Successful right arm power PICC line placement with ultrasound and
fluoroscopic guidance. The catheter is ready for use.

## 2016-04-17 ENCOUNTER — Emergency Department (HOSPITAL_COMMUNITY)
Admission: EM | Admit: 2016-04-17 | Discharge: 2016-04-17 | Disposition: A | Discharge status: Home / Self Care | Payer: Medicare Other | Source: Home / Self Care | Attending: Emergency Medicine | Admitting: Emergency Medicine

## 2016-04-17 ENCOUNTER — Emergency Department (HOSPITAL_COMMUNITY): Payer: Medicare Other

## 2016-04-17 ENCOUNTER — Encounter (HOSPITAL_COMMUNITY): Payer: Self-pay | Admitting: Emergency Medicine

## 2016-04-17 ENCOUNTER — Inpatient Hospital Stay (HOSPITAL_COMMUNITY)
Admission: EM | Admit: 2016-04-17 | Discharge: 2016-04-20 | DRG: 299 | Disposition: A | Discharge status: Home / Self Care | Payer: Medicare Other | Attending: Internal Medicine | Admitting: Internal Medicine

## 2016-04-17 DIAGNOSIS — I82622 Acute embolism and thrombosis of deep veins of left upper extremity: Principal | ICD-10-CM | POA: Diagnosis present

## 2016-04-17 DIAGNOSIS — Z833 Family history of diabetes mellitus: Secondary | ICD-10-CM | POA: Diagnosis not present

## 2016-04-17 DIAGNOSIS — D638 Anemia in other chronic diseases classified elsewhere: Secondary | ICD-10-CM | POA: Diagnosis not present

## 2016-04-17 DIAGNOSIS — D571 Sickle-cell disease without crisis: Secondary | ICD-10-CM | POA: Diagnosis not present

## 2016-04-17 DIAGNOSIS — F329 Major depressive disorder, single episode, unspecified: Secondary | ICD-10-CM | POA: Diagnosis present

## 2016-04-17 DIAGNOSIS — D5700 Hb-SS disease with crisis, unspecified: Secondary | ICD-10-CM | POA: Diagnosis present

## 2016-04-17 DIAGNOSIS — F1120 Opioid dependence, uncomplicated: Secondary | ICD-10-CM | POA: Diagnosis present

## 2016-04-17 DIAGNOSIS — Z87891 Personal history of nicotine dependence: Secondary | ICD-10-CM

## 2016-04-17 DIAGNOSIS — F1490 Cocaine use, unspecified, uncomplicated: Secondary | ICD-10-CM | POA: Diagnosis present

## 2016-04-17 DIAGNOSIS — Z79899 Other long term (current) drug therapy: Secondary | ICD-10-CM | POA: Diagnosis not present

## 2016-04-17 DIAGNOSIS — G8929 Other chronic pain: Secondary | ICD-10-CM | POA: Diagnosis not present

## 2016-04-17 DIAGNOSIS — Z86718 Personal history of other venous thrombosis and embolism: Secondary | ICD-10-CM | POA: Diagnosis present

## 2016-04-17 DIAGNOSIS — D62 Acute posthemorrhagic anemia: Secondary | ICD-10-CM | POA: Diagnosis not present

## 2016-04-17 DIAGNOSIS — F1410 Cocaine abuse, uncomplicated: Secondary | ICD-10-CM | POA: Diagnosis not present

## 2016-04-17 DIAGNOSIS — Z8249 Family history of ischemic heart disease and other diseases of the circulatory system: Secondary | ICD-10-CM | POA: Diagnosis not present

## 2016-04-17 DIAGNOSIS — K219 Gastro-esophageal reflux disease without esophagitis: Secondary | ICD-10-CM | POA: Diagnosis present

## 2016-04-17 DIAGNOSIS — D72829 Elevated white blood cell count, unspecified: Secondary | ICD-10-CM | POA: Diagnosis present

## 2016-04-17 DIAGNOSIS — Z91040 Latex allergy status: Secondary | ICD-10-CM

## 2016-04-17 DIAGNOSIS — Z91018 Allergy to other foods: Secondary | ICD-10-CM

## 2016-04-17 DIAGNOSIS — M79609 Pain in unspecified limb: Secondary | ICD-10-CM

## 2016-04-17 DIAGNOSIS — M79602 Pain in left arm: Secondary | ICD-10-CM | POA: Diagnosis not present

## 2016-04-17 DIAGNOSIS — R112 Nausea with vomiting, unspecified: Secondary | ICD-10-CM | POA: Diagnosis present

## 2016-04-17 DIAGNOSIS — K5903 Drug induced constipation: Secondary | ICD-10-CM | POA: Diagnosis not present

## 2016-04-17 LAB — RAPID URINE DRUG SCREEN, HOSP PERFORMED
Amphetamines: NOT DETECTED
Barbiturates: NOT DETECTED
Benzodiazepines: NOT DETECTED
Cocaine: POSITIVE — AB
OPIATES: POSITIVE — AB
Tetrahydrocannabinol: NOT DETECTED

## 2016-04-17 LAB — CBC WITH DIFFERENTIAL/PLATELET
BASOS PCT: 1 %
Basophils Absolute: 0.1 10*3/uL (ref 0.0–0.1)
EOS ABS: 0.5 10*3/uL (ref 0.0–0.7)
Eosinophils Relative: 4 %
HEMATOCRIT: 27.8 % — AB (ref 36.0–46.0)
HEMOGLOBIN: 9.5 g/dL — AB (ref 12.0–15.0)
LYMPHS ABS: 2.7 10*3/uL (ref 0.7–4.0)
Lymphocytes Relative: 24 %
MCH: 29.1 pg (ref 26.0–34.0)
MCHC: 34.2 g/dL (ref 30.0–36.0)
MCV: 85.3 fL (ref 78.0–100.0)
MONOS PCT: 9 %
Monocytes Absolute: 1 10*3/uL (ref 0.1–1.0)
NEUTROS ABS: 7 10*3/uL (ref 1.7–7.7)
NEUTROS PCT: 62 %
Platelets: 633 10*3/uL — ABNORMAL HIGH (ref 150–400)
RBC: 3.26 MIL/uL — AB (ref 3.87–5.11)
RDW: 16.5 % — ABNORMAL HIGH (ref 11.5–15.5)
WBC: 11.2 10*3/uL — AB (ref 4.0–10.5)

## 2016-04-17 LAB — I-STAT BETA HCG BLOOD, ED (MC, WL, AP ONLY)

## 2016-04-17 LAB — BASIC METABOLIC PANEL
ANION GAP: 5 (ref 5–15)
BUN: 6 mg/dL (ref 6–20)
CALCIUM: 8.8 mg/dL — AB (ref 8.9–10.3)
CO2: 24 mmol/L (ref 22–32)
CREATININE: 0.62 mg/dL (ref 0.44–1.00)
Chloride: 108 mmol/L (ref 101–111)
GFR calc non Af Amer: >60 mL/min (ref >60)
Glucose, Bld: 89 mg/dL (ref 65–99)
Potassium: 3.6 mmol/L (ref 3.5–5.1)
SODIUM: 137 mmol/L (ref 135–145)

## 2016-04-17 LAB — RETICULOCYTES
RBC.: 3.26 MIL/uL — AB (ref 3.87–5.11)
RETIC CT PCT: 7.6 % — AB (ref 0.4–3.1)
Retic Count, Absolute: 247.8 10*3/uL — ABNORMAL HIGH (ref 19.0–186.0)

## 2016-04-17 MED ORDER — POTASSIUM CHLORIDE IN NACL 20-0.45 MEQ/L-% IV SOLN
INTRAVENOUS | Status: DC
  Administered 2016-04-17 – 2016-04-20 (×6): via INTRAVENOUS
  Filled 2016-04-17 (×9): qty 1000

## 2016-04-17 MED ORDER — ONDANSETRON HCL 4 MG/2ML IJ SOLN
4.0000 mg | INTRAMUSCULAR | Status: DC | PRN
  Administered 2016-04-17 (×2): 4 mg via INTRAVENOUS
  Filled 2016-04-17 (×2): qty 2

## 2016-04-17 MED ORDER — HYDROMORPHONE 1 MG/ML IV SOLN
INTRAVENOUS | Status: DC
  Administered 2016-04-17: 22:00:00 via INTRAVENOUS
  Administered 2016-04-18: 2.7 mg via INTRAVENOUS
  Administered 2016-04-18: 2.8 mg via INTRAVENOUS
  Administered 2016-04-18: 1.5 mg via INTRAVENOUS
  Administered 2016-04-18: 2.7 mg via INTRAVENOUS
  Filled 2016-04-17: qty 25

## 2016-04-17 MED ORDER — HYDROMORPHONE HCL 1 MG/ML IJ SOLN: 1.0000 mg | INTRAMUSCULAR | Status: DC | PRN

## 2016-04-17 MED ORDER — DIPHENHYDRAMINE HCL 25 MG PO CAPS: 25.0000 mg | ORAL_CAPSULE | ORAL | Status: DC | PRN

## 2016-04-17 MED ORDER — HYDROMORPHONE HCL 2 MG/ML IJ SOLN: 2.0000 mg | INTRAMUSCULAR | Status: AC

## 2016-04-17 MED ORDER — DIPHENHYDRAMINE HCL 25 MG PO CAPS
25.0000 mg | ORAL_CAPSULE | ORAL | Status: DC | PRN
  Administered 2016-04-17: 25 mg via ORAL
  Filled 2016-04-17 (×2): qty 1

## 2016-04-17 MED ORDER — ACETAMINOPHEN 500 MG PO TABS
1000.0000 mg | ORAL_TABLET | Freq: Once | ORAL | Status: DC
  Filled 2016-04-17: qty 2

## 2016-04-17 MED ORDER — DIPHENHYDRAMINE HCL 50 MG/ML IJ SOLN
25.0000 mg | INTRAMUSCULAR | Status: DC | PRN
  Administered 2016-04-17 – 2016-04-18 (×2): 25 mg via INTRAVENOUS
  Filled 2016-04-17 (×5): qty 0.5

## 2016-04-17 MED ORDER — KETOROLAC TROMETHAMINE 30 MG/ML IJ SOLN
30.0000 mg | Freq: Four times a day (QID) | INTRAMUSCULAR | Status: DC | PRN
  Administered 2016-04-17 – 2016-04-18 (×2): 30 mg via INTRAVENOUS
  Filled 2016-04-17 (×2): qty 1

## 2016-04-17 MED ORDER — HYDROMORPHONE HCL 2 MG/ML IJ SOLN
2.0000 mg | INTRAMUSCULAR | Status: DC
  Administered 2016-04-17 – 2016-04-18 (×10): 2 mg via INTRAVENOUS
  Filled 2016-04-17 (×11): qty 1

## 2016-04-17 MED ORDER — SODIUM CHLORIDE 0.9 % IV BOLUS (SEPSIS)
1000.0000 mL | Freq: Once | INTRAVENOUS | Status: AC
  Administered 2016-04-17: 1000 mL via INTRAVENOUS

## 2016-04-17 MED ORDER — SODIUM CHLORIDE 0.9% FLUSH
3.0000 mL | Freq: Two times a day (BID) | INTRAVENOUS | Status: DC
  Administered 2016-04-18: 3 mL via INTRAVENOUS

## 2016-04-17 MED ORDER — HYDROMORPHONE HCL 2 MG/ML IJ SOLN
2.0000 mg | Freq: Once | INTRAMUSCULAR | Status: DC
  Filled 2016-04-17: qty 1

## 2016-04-17 MED ORDER — SENNOSIDES-DOCUSATE SODIUM 8.6-50 MG PO TABS
1.0000 | ORAL_TABLET | Freq: Two times a day (BID) | ORAL | Status: DC
  Administered 2016-04-17 – 2016-04-20 (×6): 1 via ORAL
  Filled 2016-04-17 (×6): qty 1

## 2016-04-17 MED ORDER — PROMETHAZINE HCL 25 MG RE SUPP: 12.5000 mg | RECTAL | Status: DC | PRN

## 2016-04-17 MED ORDER — HYDROMORPHONE HCL 2 MG/ML IJ SOLN
2.0000 mg | INTRAMUSCULAR | Status: AC
  Administered 2016-04-17: 2 mg via INTRAVENOUS
  Filled 2016-04-17: qty 1

## 2016-04-17 MED ORDER — MORPHINE SULFATE ER 30 MG PO TBCR
30.0000 mg | EXTENDED_RELEASE_TABLET | Freq: Two times a day (BID) | ORAL | Status: DC
  Administered 2016-04-17 – 2016-04-20 (×6): 30 mg via ORAL
  Filled 2016-04-17 (×6): qty 1

## 2016-04-17 MED ORDER — KETOROLAC TROMETHAMINE 30 MG/ML IJ SOLN
30.0000 mg | INTRAMUSCULAR | Status: AC
  Administered 2016-04-17: 30 mg via INTRAVENOUS
  Filled 2016-04-17: qty 1

## 2016-04-17 MED ORDER — POLYETHYLENE GLYCOL 3350 17 G PO PACK
17.0000 g | PACK | Freq: Every day | ORAL | Status: DC | PRN
  Administered 2016-04-19: 17 g via ORAL
  Filled 2016-04-17: qty 1

## 2016-04-17 MED ORDER — HYDROXYUREA 500 MG PO CAPS
500.0000 mg | ORAL_CAPSULE | Freq: Two times a day (BID) | ORAL | Status: DC
  Administered 2016-04-17 – 2016-04-20 (×6): 500 mg via ORAL
  Filled 2016-04-17 (×6): qty 1

## 2016-04-17 MED ORDER — KETOROLAC TROMETHAMINE 60 MG/2ML IM SOLN
60.0000 mg | Freq: Once | INTRAMUSCULAR | Status: DC
  Filled 2016-04-17: qty 2

## 2016-04-17 MED ORDER — PANTOPRAZOLE SODIUM 40 MG PO TBEC
40.0000 mg | DELAYED_RELEASE_TABLET | Freq: Every day | ORAL | Status: DC
  Administered 2016-04-17 – 2016-04-20 (×4): 40 mg via ORAL
  Filled 2016-04-17 (×4): qty 1

## 2016-04-17 MED ORDER — HYDROMORPHONE HCL 2 MG/ML IJ SOLN: 2.0000 mg | INTRAMUSCULAR | Status: DC

## 2016-04-17 MED ORDER — ENOXAPARIN SODIUM 100 MG/ML ~~LOC~~ SOLN: 1.0000 mg/kg | Freq: Two times a day (BID) | SUBCUTANEOUS | Status: DC

## 2016-04-17 MED ORDER — NALOXONE HCL 0.4 MG/ML IJ SOLN: 0.4000 mg | INTRAMUSCULAR | Status: DC | PRN

## 2016-04-17 MED ORDER — SODIUM CHLORIDE 0.9% FLUSH: 9.0000 mL | INTRAVENOUS | Status: DC | PRN

## 2016-04-17 MED ORDER — ENOXAPARIN SODIUM 100 MG/ML ~~LOC~~ SOLN
1.0000 mg/kg | Freq: Two times a day (BID) | SUBCUTANEOUS | Status: DC
  Administered 2016-04-18 – 2016-04-19 (×3): 85 mg via SUBCUTANEOUS
  Filled 2016-04-17 (×3): qty 1

## 2016-04-17 MED ORDER — PROMETHAZINE HCL 25 MG PO TABS: 12.5000 mg | ORAL_TABLET | ORAL | Status: DC | PRN

## 2016-04-17 MED ORDER — ENOXAPARIN SODIUM 100 MG/ML ~~LOC~~ SOLN
1.0000 mg/kg | Freq: Once | SUBCUTANEOUS | Status: DC
  Administered 2016-04-17: 85 mg via SUBCUTANEOUS
  Filled 2016-04-17: qty 1

## 2016-04-17 NOTE — ED Notes (Signed)
Dr.Ortiz at bedside to evaluate pt

## 2016-04-17 NOTE — ED Notes (Signed)
Pt refused IM/PO medications, sts needs to have her port accessed and needs IV medications as well as IV fluids.EDP notified.

## 2016-04-17 NOTE — ED Triage Notes (Signed)
Patient c/o left arm pain that been going on since yesterday. Patient has sickle cell. Patient took oxy and morphine without any relief.

## 2016-04-17 NOTE — Progress Notes (Addendum)
VASCULAR LAB PRELIMINARY  PRELIMINARY  PRELIMINARY  PRELIMINARY  Left upper extremity venous duplex completed.    Preliminary report:  There is acute, non occlusive DVT noted in the brachial vein in the left upper extremity.   Gave report to Dr. Ellwood Sayers, [facility], RVT 04/17/2016, 7:31 PM

## 2016-04-17 NOTE — ED Notes (Signed)
Pt cannot use restroom at this time, aware urine specimen is needed.  

## 2016-04-17 NOTE — ED Provider Notes (Signed)
[place] DEPT Provider Note   CSN: [id number] Arrival date & time: 04/17/16  1152     History   Chief Complaint Chief Complaint  Patient presents with  . Arm Pain  . Sickle Cell Pain Crisis    HPI [name] is a 25 y.o. female.  25 yo F with left arm pain. Woke up with it this morning.  Denies injury.  Woke up with it like this.  Worse with movement and palpation.     The history is provided by the patient.  Arm Pain  This is a new problem. The current episode started yesterday. The problem occurs constantly. The problem has not changed since onset.Pertinent negatives include no chest pain, no headaches and no shortness of breath. The symptoms are aggravated by bending and twisting. Nothing relieves the symptoms. She has tried nothing for the symptoms.  Sickle Cell Pain Crisis  Associated symptoms: no chest pain, no congestion, no fever, no headaches, no nausea, no shortness of breath, no vomiting and no wheezing     Past Medical History:  Diagnosis Date  . Blood transfusion    "lots"  . Blood transfusion without reported diagnosis   . Chronic back pain    "very severe; have knot in my back; from tight muscle; take RX and exercise for it"  . Depression 01/06/2011  . Exertional dyspnea    "sometimes"  . Genital HSV   . GERD (gastroesophageal reflux disease) 02/17/2011  . Migraines 11/08/11   "@ least twice/month"  . 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  . Mood swings ([place]) 11/08/11   "I go back and forth; real bad"  . Sickle cell anemia ([name])   . Sickle cell anemia with crisis ([place])   . Trichotillomania    h/o    Patient Active Problem List   Diagnosis Date Noted  . Post partum depression 04/03/2016  . Syncope   . 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 Fall during current hospitalization   . 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 Sickle cell anemia with pain ([place]) 02/16/2016  . Chronic back pain   . Sickle  cell anemia with crisis ([place]) 01/05/2016  . Nausea & vomiting 12/27/2015  . Sickle cell crisis ([place]) 11/09/2015  . Sepsis ([place]) 11/09/2015  . Thrombocytosis ([place]) 11/09/2015  . Vaginal bleeding 11/09/2015  . Hyperbilirubinemia 11/09/2015  . Pyrexia   . Vaginal delivery 10/31/2015  . Sickle cell pain crisis ([place]) 10/26/2015  . Positive GBS test 10/18/2015  . Narcotic dependence ([place]) 10/06/2015  . 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 Nausea and vomiting 09/07/2015  . High risk for intrapartum complications, antepartum 08/12/2015  . Chronic pain 08/04/2015  . Cocaine use 07/24/2015  . Maternal sickle cell anemia affecting pregnancy, antepartum ([place]) 07/14/2015  . Anemia of chronic disease   . Hypokalemia 10/29/2014  . Hb-SS disease without crisis ([place]) 02/26/2013  . Migraines 11/08/2011  . GERD (gastroesophageal reflux disease) 02/17/2011  . 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 Sickle cell disease ([place]) 01/08/2009  . 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 CHOLECYSTECTOMY  05/2010  . DILATION AND CURETTAGE OF UTERUS  02/20/11   S/P miscarriage  . IR GENERIC HISTORICAL  12/23/2015   IR FLUORO GUIDE CV LINE RIGHT 12/23/2015 Jacqulynn Cadet, MD [facility]-INTERV RAD  . IR GENERIC HISTORICAL  12/23/2015  IR US GUIDE VASC ACCESS RIGHT 12/23/2015 Jacqulynn Cadet, MD [facility]-INTERV RAD    OB History    Gravida Para Term Preterm AB Living   3 2 2   1 2    SAB TAB Ectopic Multiple Live Births   1     0 2      Obstetric Comments   Miscarried in October 2012 at about 7 weeks       Home Medications    Prior to Admission medications   Medication Sig Start Date End Date Taking? Authorizing Provider  aspirin-acetaminophen-caffeine (EXCEDRIN MIGRAINE) [phone] MG tablet Take 2 tablets by mouth every 6 (six) hours as needed for headache.    Yes  Historical Provider, MD  cyclobenzaprine (FLEXERIL) 10 MG tablet Take 1 tablet (10 mg total) by mouth 3 (three) times daily as needed for muscle spasms. 02/09/16  Yes Tresa Garter, MD  hydroxyurea (HYDREA) 500 MG capsule Take 2 capsules (1,000 mg total) by mouth daily. May take with food to minimize GI side effects. Patient taking differently: Take 500 mg by mouth 2 (two) times daily. May take with food to minimize GI side effects. 03/14/16  Yes Tresa Garter, MD  ibuprofen (ADVIL,MOTRIN) 600 MG tablet Take 1 tablet (600 mg total) by mouth every 6 (six) hours as needed for mild pain or moderate pain. 03/14/16  Yes Olugbemiga Essie Christine, MD  morphine (MS CONTIN) 30 MG 12 hr tablet Take 1 tablet (30 mg total) by mouth every 12 (twelve) hours. 04/06/16  Yes Olugbemiga Essie Christine, MD  oxyCODONE (ROXICODONE) 15 MG immediate release tablet Take 1 tablet (15 mg total) by mouth every 4 (four) hours as needed for pain. 04/06/16 04/21/16 Yes Olugbemiga Essie Christine, MD  DULoxetine (CYMBALTA) 30 MG capsule Take 1 capsule (30 mg total) by mouth daily. Patient not taking: Reported on 04/17/2016 03/04/16   [name], MD  megestrol (MEGACE) 40 MG tablet Take 1 tablet (40 mg total) by mouth 3 (three) times daily. Patient not taking: Reported on 04/17/2016 04/07/16   Elwyn Reach, MD  megestrol (MEGACE) 40 MG tablet Take 1 tablet (40 mg total) by mouth 2 (two) times daily. Patient not taking: Reported on 04/17/2016 04/10/16   Elwyn Reach, MD  megestrol (MEGACE) 40 MG tablet Take 1 tablet (40 mg total) by mouth daily. Patient not taking: Reported on 04/17/2016 04/13/16   Elwyn Reach, MD    Family History Family History  Problem Relation Age of Onset  . Sickle cell trait Mother   . Sickle cell trait Father   . Diabetes Maternal Grandmother   . Diabetes Paternal Grandmother   . Hypertension Paternal Grandmother   . Diabetes Maternal Grandfather     Social History Social History    Substance Use Topics  . Smoking status: Former Smoker    Packs/day: 0.25    Years: 1.00    Types: Cigarettes    Quit date: 03/25/2013  . Smokeless tobacco: Never Used  . Alcohol use No     Comment: pt states she quit marijuan in May 2013. Rare ETOH, + cigarettes.  She is enrolled in school     Allergies   Food and Latex   Review of Systems Review of Systems  Constitutional: Negative for chills and fever.  HENT: Negative for congestion and rhinorrhea.   Eyes: Negative for redness and visual disturbance.  Respiratory: Negative for shortness of breath and wheezing.   Cardiovascular: Negative for chest pain and palpitations.  Gastrointestinal: Negative for nausea and vomiting.  Genitourinary: Negative for dysuria and urgency.  Musculoskeletal: Positive for myalgias. Negative for arthralgias.  Skin: Negative for pallor and wound.  Neurological: Negative for dizziness and headaches.     Physical Exam Updated Vital Signs BP 115/74 (BP Location: Right Arm)   Pulse 94   Temp 98.9 F (37.2 C) (Oral)   Resp 14   Ht 5\' 1"  (1.549 m)   Wt 187 lb (84.8 kg)   LMP 04/10/2016   SpO2 95%   BMI 35.33 kg/m   Physical Exam  Constitutional: She is oriented to person, place, and time. She appears well-developed and well-nourished. No distress.  HENT:  Head: Normocephalic and atraumatic.  Eyes: EOM are normal. Pupils are equal, round, and reactive to light.  Neck: Normal range of motion. Neck supple.  Cardiovascular: Normal rate and regular rhythm.  Exam reveals no gallop and no friction rub.   No murmur heard. Pulmonary/Chest: Effort normal. She has no wheezes. She has no rales.  Abdominal: Soft. She exhibits no distension. There is no tenderness.  Musculoskeletal: She exhibits tenderness (TTP about the L distal bicep.  ). She exhibits no edema.  Neurological: She is alert and oriented to person, place, and time.  Skin: Skin is warm and dry. She is not diaphoretic.  Psychiatric:  She has a normal mood and affect. Her behavior is normal.  Nursing note and vitals reviewed.    ED Treatments / Results  Labs (all labs ordered are listed, but only abnormal results are displayed) Labs Reviewed  CBC WITH DIFFERENTIAL/PLATELET - Abnormal; Notable for the following:       Result Value   WBC 11.2 (*)    RBC 3.26 (*)    Hemoglobin 9.5 (*)    HCT 27.8 (*)    RDW 16.5 (*)    Platelets 633 (*)    All other components within normal limits  RETICULOCYTES - Abnormal; Notable for the following:    Retic Ct Pct 7.6 (*)    RBC. 3.26 (*)    Retic Count, Manual 247.8 (*)    All other components within normal limits  BASIC METABOLIC PANEL - Abnormal; Notable for the following:    Calcium 8.8 (*)    All other components within normal limits  RAPID URINE DRUG SCREEN, HOSP PERFORMED - Abnormal; Notable for the following:    Opiates POSITIVE (*)    Cocaine POSITIVE (*)    All other components within normal limits  I-STAT BETA HCG BLOOD, ED ([facility], [facility], [facility] ONLY)    EKG  EKG Interpretation None       Radiology Dg Humerus Left  Result Date: 04/17/2016 CLINICAL DATA:  Diffuse left arm pain. No known injury. Sickle cell disease. EXAM: LEFT HUMERUS - 2+ VIEW COMPARISON:  Left shoulder dated 05/06/2012 and 12/11/2011 and left humerus dated 03/11/2009. FINDINGS: Patchy sclerosis is again demonstrated in the humeral head. No other abnormalities are seen. 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 No acute abnormality. Electronically Signed   By: Claudie Revering M.D.   On: 04/17/2016 13:19    Procedures Procedures (including critical care time)  Medications Ordered in ED Medications  acetaminophen (TYLENOL) tablet 1,000 mg (1,000 mg Oral Refused 04/17/16 1409)  HYDROmorphone (DILAUDID) injection 2 mg (2 mg Intravenous Given 04/17/16 2010)    Or  HYDROmorphone (DILAUDID) injection 2 mg ( Subcutaneous See Alternative 04/17/16 2010)  diphenhydrAMINE (BENADRYL)  capsule 25-50 mg (25 mg Oral Given 04/17/16  2011)  ondansetron [facility]) injection 4 mg (4 mg Intravenous Given 04/17/16 2010)  enoxaparin (LOVENOX) injection 85 mg (not administered)  sodium chloride 0.9 % bolus 1,000 mL (not administered)  ketorolac (TORADOL) 30 MG/ML injection 30 mg (30 mg Intravenous Given 04/17/16 1404)  HYDROmorphone (DILAUDID) injection 2 mg (2 mg Intravenous Given 04/17/16 1404)    Or  HYDROmorphone (DILAUDID) injection 2 mg ( Subcutaneous See Alternative 04/17/16 1404)  HYDROmorphone (DILAUDID) injection 2 mg (2 mg Intravenous Given 04/17/16 1437)    Or  HYDROmorphone (DILAUDID) injection 2 mg ( Subcutaneous See Alternative 04/17/16 1437)  HYDROmorphone (DILAUDID) injection 2 mg (2 mg Intravenous Given 04/17/16 1525)    Or  HYDROmorphone (DILAUDID) injection 2 mg ( Subcutaneous See Alternative 04/17/16 1525)     Initial Impression / Assessment and Plan / ED Course  I have reviewed the triage vital signs and the nursing notes.  Pertinent labs & imaging results that were available during my care of the patient were reviewed by me and considered in my medical decision making (see chart for details).  Clinical Course     25 yo F with left biceps pain.  Likely muscle strain.  Patient is convinced that this is a sickle cell pain crisis.  Requesting IV narcotics.  Xray with no acute finding.  Patient requesting admission post 3 doses of meds.  Discussed with sickle cell oncall, requesting US of the LUE then he will eval at bedside.   The patients results and plan were reviewed and discussed.   Any x-rays performed were independently reviewed by myself.   Differential diagnosis were considered with the presenting HPI.  Medications  acetaminophen (TYLENOL) tablet 1,000 mg (1,000 mg Oral Refused 04/17/16 1409)  HYDROmorphone (DILAUDID) injection 2 mg (2 mg Intravenous Given 04/17/16 2010)    Or  HYDROmorphone (DILAUDID) injection 2 mg ( Subcutaneous See Alternative 04/17/16  2010)  diphenhydrAMINE (BENADRYL) capsule 25-50 mg (25 mg Oral Given 04/17/16 2011)  ondansetron (ZOFRAN) injection 4 mg (4 mg Intravenous Given 04/17/16 2010)  enoxaparin (LOVENOX) injection 85 mg (not administered)  sodium chloride 0.9 % bolus 1,000 mL (not administered)  ketorolac (TORADOL) 30 MG/ML injection 30 mg (30 mg Intravenous Given 04/17/16 1404)  HYDROmorphone (DILAUDID) injection 2 mg (2 mg Intravenous Given 04/17/16 1404)    Or  HYDROmorphone (DILAUDID) injection 2 mg ( Subcutaneous See Alternative 04/17/16 1404)  HYDROmorphone (DILAUDID) injection 2 mg (2 mg Intravenous Given 04/17/16 1437)    Or  HYDROmorphone (DILAUDID) injection 2 mg ( Subcutaneous See Alternative 04/17/16 1437)  HYDROmorphone (DILAUDID) injection 2 mg (2 mg Intravenous Given 04/17/16 1525)    Or  HYDROmorphone (DILAUDID) injection 2 mg ( Subcutaneous See Alternative 04/17/16 1525)    Vitals:   04/17/16 1829 04/17/16 1829 04/17/16 1953 04/17/16 2028  BP: 107/77 107/77 109/79 115/74  Pulse: 97 103 101 94  Resp: 26 20 18 14   Temp:      TempSrc:  Oral    SpO2: 99% 94% 97% 95%  Weight:      Height:        Final diagnoses:  Sickle cell anemia with pain ([place])      Final Clinical Impressions(s) / ED Diagnoses   Final diagnoses:  Sickle cell anemia with pain [facility])    New Prescriptions New Prescriptions   No medications on file     Deno Etienne, DO 04/17/16 2041

## 2016-04-17 NOTE — H&P (Signed)
History and Physical    RHYANNA LAUREL [id number] DOB: [birth year] DOA: 04/17/2016  PCP: Angelica Chessman, MD   Patient coming from: Home.  Chief Complaint: Left arm pain.  HPI: [name] is a 25 y.o. female with medical history significant of sickle cell disease, sickle cell anemia, migraine headaches, GERD, genital HSV, depression who is coming to the emergency department with complaints of left arm pain since yesterday which did not respond to her MS Contin and oxycodone IR at home. She denies fever, chills, but feels fatigue. She denies headache, sore throat, productive cough, dyspnea, chest pain, palpitations, dizziness, diaphoresis, abdominal pain, diarrhea, constipation, melena, hematochezia, dysuria or hematuria.  ED Course: The patient received IV fluids, supplemental oxygen and multiple rounds of hydromorphone IV. Her workup showed WBCs of 11.2, hemoglobin of 9.5 g/dL, platelet 633, positive opiates and cocaine on urine toxicology. Doppler US of LUE showed non-occlusive DVT of the brachial vein.  Review of Systems: As per HPI otherwise 10 point review of systems negative.    Past Medical History:  Diagnosis Date  . Blood transfusion    "lots"  . Blood transfusion without reported diagnosis   . Chronic back pain    "very severe; have knot in my back; from tight muscle; take RX and exercise for it"  . Depression 01/06/2011  . Exertional dyspnea    "sometimes"  . Genital HSV   . GERD (gastroesophageal reflux disease) 02/17/2011  . Migraines 11/08/11   "@ least twice/month"  . 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  . Mood swings ([place]) 11/08/11   "I go back and forth; real bad"  . Sickle cell anemia ([name])   . Sickle cell anemia with crisis ([place])   . Trichotillomania    h/o    Past Surgical History:  Procedure Laterality Date  . CHOLECYSTECTOMY  05/2010  . DILATION AND CURETTAGE OF UTERUS  02/20/11   S/P miscarriage  . IR GENERIC HISTORICAL   12/23/2015   IR FLUORO GUIDE CV LINE RIGHT 12/23/2015 Jacqulynn Cadet, MD [facility]-INTERV RAD  . IR GENERIC HISTORICAL  12/23/2015   IR US GUIDE VASC ACCESS RIGHT 12/23/2015 Jacqulynn Cadet, MD [facility]INTER[facility]     reports that she quit smoking about 3 years ago. Her smoking use included Cigarettes. She has a 0.25 pack-year smoking history. She has never used smokeless tobacco. She reports that she uses drugs, including Other-see comments and Cocaine. She reports that she does not drink alcohol.  Allergies  Allergen Reactions  . Food Hives and Other (See Comments)    Pt states that she is allergic to carrots.   . Latex Rash    Family History  Problem Relation Age of Onset  . Sickle cell trait Mother   . Sickle cell trait Father   . Diabetes Maternal Grandmother   . Diabetes Paternal Grandmother   . Hypertension Paternal Grandmother   . Diabetes Maternal Grandfather    Prior to Admission medications   Medication Sig Start Date End Date Taking? Authorizing Provider  aspirin-acetaminophen-caffeine (EXCEDRIN MIGRAINE) [phone] MG tablet Take 2 tablets by mouth every 6 (six) hours as needed for headache.    Yes Historical Provider, MD  cyclobenzaprine (FLEXERIL) 10 MG tablet Take 1 tablet (10 mg total) by mouth 3 (three) times daily as needed for muscle spasms. 02/09/16  Yes Tresa Garter, MD  hydroxyurea (HYDREA) 500 MG capsule Take 2 capsules (1,000 mg total) by mouth daily. May take with food to minimize GI  side effects. Patient taking differently: Take 500 mg by mouth 2 (two) times daily. May take with food to minimize GI side effects. 03/14/16  Yes Tresa Garter, MD  ibuprofen (ADVIL,MOTRIN) 600 MG tablet Take 1 tablet (600 mg total) by mouth every 6 (six) hours as needed for mild pain or moderate pain. 03/14/16  Yes Olugbemiga Essie Christine, MD  morphine (MS CONTIN) 30 MG 12 hr tablet Take 1 tablet (30 mg total) by mouth every 12 (twelve) hours. 04/06/16  Yes Olugbemiga Essie Christine, MD    oxyCODONE (ROXICODONE) 15 MG immediate release tablet Take 1 tablet (15 mg total) by mouth every 4 (four) hours as needed for pain. 04/06/16 04/21/16 Yes Olugbemiga Essie Christine, MD  DULoxetine (CYMBALTA) 30 MG capsule Take 1 capsule (30 mg total) by mouth daily. Patient not taking: Reported on 04/17/2016 03/04/16   [name], MD  megestrol (MEGACE) 40 MG tablet Take 1 tablet (40 mg total) by mouth 3 (three) times daily. Patient not taking: Reported on 04/17/2016 04/07/16   Elwyn Reach, MD  megestrol (MEGACE) 40 MG tablet Take 1 tablet (40 mg total) by mouth 2 (two) times daily. Patient not taking: Reported on 04/17/2016 04/10/16   Elwyn Reach, MD  megestrol (MEGACE) 40 MG tablet Take 1 tablet (40 mg total) by mouth daily. Patient not taking: Reported on 04/17/2016 04/13/16   Elwyn Reach, MD    Physical Exam:  Constitutional: Tearful, in emotional/physical distress due to pain. Vitals:   04/17/16 1829 04/17/16 1953 04/17/16 2028 04/17/16 2107  BP: 107/77 109/79 115/74 107/79  Pulse: 103 101 94 91  Resp: 20 18 14    Temp:    98.1 F (36.7 C)  TempSrc: Oral   Oral  SpO2: 94% 97% 95% 95%  Weight:      Height:       Eyes: PERRL, lids and conjunctivae normal ENMT: Mucous membranes and lips are dry. Posterior pharynx clear of any exudate or lesions. Neck: Normal, supple, no masses, no thyromegaly Respiratory: Clear to auscultation bilaterally, no wheezing, no crackles. Normal respiratory effort. No accessory muscle use.  Cardiovascular: Regular rate and rhythm, no murmurs / rubs / gallops. No extremity edema. 2+ pedal pulses. No carotid bruits.  Abdomen: no tenderness, no masses palpated. No hepatosplenomegaly. Bowel sounds positive.  Musculoskeletal: no clubbing / cyanosis. Decreased ROM of left arm due to pain, no contractures. Normal muscle tone.  Skin: no rashes, lesions, ulcers. Neurologic: CN 2-12 grossly intact. Sensation intact, DTR normal. Strength 5/5 in all 4.   Psychiatric:  Alert and oriented x 4. Mildly anxious mood due to pain.    Labs on Admission: I have personally reviewed following labs and imaging studies  CBC:  Recent Labs Lab 04/17/16 1339  WBC 11.2*  NEUTROABS 7.0  HGB 9.5*  HCT 27.8*  MCV 85.3  PLT [id number]*   Basic Metabolic Panel:  Recent Labs Lab 04/17/16 1339  NA 137  K 3.6  CL 108  CO2 24  GLUCOSE 89  BUN 6  CREATININE 0.62  CALCIUM 8.8*   GFR: Estimated Creatinine Clearance: 107.2 mL/min (by C-G formula based on SCr of 0.62 mg/dL). Liver Function Tests: No results for input(s): AST, ALT, ALKPHOS, BILITOT, PROT, ALBUMIN in the last 168 hours. No results for input(s): LIPASE, AMYLASE in the last 168 hours. No results for input(s): AMMONIA in the last 168 hours. Coagulation Profile: No results for input(s): INR, PROTIME in the last 168 hours. Cardiac Enzymes: No results  for input(s): CKTOTAL, CKMB, CKMBINDEX, TROPONINI in the last 168 hours. BNP (last 3 results) No results for input(s): PROBNP in the last 8760 hours. HbA1C: No results for input(s): HGBA1C in the last 72 hours. CBG: No results for input(s): GLUCAP in the last 168 hours. Lipid Profile: No results for input(s): CHOL, HDL, LDLCALC, TRIG, CHOLHDL, LDLDIRECT in the last 72 hours. Thyroid Function Tests: No results for input(s): TSH, T4TOTAL, FREET4, T3FREE, THYROIDAB in the last 72 hours. Anemia Panel:  Recent Labs  04/17/16 1339  RETICCTPCT 7.6*   Urine analysis:    Component Value Date/Time   COLORURINE YELLOW 04/02/2016 1241   APPEARANCEUR CLEAR 04/02/2016 1241   APPEARANCEUR Clear 05/06/2012 1546   LABSPEC 1.013 04/02/2016 1241   LABSPEC 1.011 05/06/2012 1546   PHURINE 6.0 04/02/2016 1241   GLUCOSEU NEGATIVE 04/02/2016 1241   GLUCOSEU Negative 05/06/2012 1546   HGBUR LARGE (A) 04/02/2016 1241   HGBUR negative 01/08/2009 1329   BILIRUBINUR NEGATIVE 04/02/2016 1241   BILIRUBINUR Negative 05/06/2012 1546   KETONESUR NEGATIVE  04/02/2016 1241   PROTEINUR NEGATIVE 04/02/2016 1241   UROBILINOGEN 2.0 (H) 10/26/2015 0817   NITRITE NEGATIVE 04/02/2016 1241   LEUKOCYTESUR NEGATIVE 04/02/2016 1241   LEUKOCYTESUR Negative 05/06/2012 1546    Radiological Exams on Admission: Dg Humerus Left  Result Date: 04/17/2016 CLINICAL DATA:  Diffuse left arm pain. No known injury. Sickle cell disease. EXAM: LEFT HUMERUS - 2+ VIEW COMPARISON:  Left shoulder dated 05/06/2012 and 12/11/2011 and left humerus dated 03/11/2009. FINDINGS: Patchy sclerosis is again demonstrated in the humeral head. No other abnormalities are seen. 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 No acute abnormality. Electronically Signed   By: Claudie Revering M.D.   On: 04/17/2016 13:19    Assessment/Plan Principal Problem:   Sickle cell pain crisis ([place]) Admit to telemetry/inpatient. Continue supplemental oxygen. Continue IV fluids. Continue analgesics, antihistamine and not the antiemetics as needed.  Active Problems:   Acute deep vein thrombosis (DVT) of left upper extremity ([name]) Analgesics as needed. Continue Lovenox 1 mg/kg every 12 hours. We will need oral anticoagulation therapy for at 3-6 months.    GERD (gastroesophageal reflux disease)   Cocaine use She denied recreational drug use when asked.    Nausea and vomiting Continue IV fluids. Continue antiemetics as needed.    Narcotic dependence ([place]) The patient is constantly worry about her pain level. Consider referral to pain clinic.    DVT prophylaxis: Lovenox SQ. Code Status: Full code. Family Communication:  Disposition Plan: Admit for pain management and anticoagulation therapy. Consults called:  Admission status: Inpatient/   Reubin Milan MD [facility] Pager [phone].  If 7PM-7AM, please contact night-coverage [email or web address] Password [facility]  04/17/2016, 9:17 PM

## 2016-04-18 ENCOUNTER — Other Ambulatory Visit: Payer: Self-pay | Admitting: Internal Medicine

## 2016-04-18 ENCOUNTER — Telehealth: Payer: Self-pay

## 2016-04-18 ENCOUNTER — Other Ambulatory Visit: Payer: Self-pay | Admitting: *Deleted

## 2016-04-18 ENCOUNTER — Encounter: Payer: Self-pay | Admitting: *Deleted

## 2016-04-18 DIAGNOSIS — D5700 Hb-SS disease with crisis, unspecified: Secondary | ICD-10-CM

## 2016-04-18 DIAGNOSIS — D571 Sickle-cell disease without crisis: Secondary | ICD-10-CM

## 2016-04-18 DIAGNOSIS — F1120 Opioid dependence, uncomplicated: Secondary | ICD-10-CM

## 2016-04-18 DIAGNOSIS — F329 Major depressive disorder, single episode, unspecified: Secondary | ICD-10-CM

## 2016-04-18 DIAGNOSIS — I82622 Acute embolism and thrombosis of deep veins of left upper extremity: Principal | ICD-10-CM

## 2016-04-18 LAB — CBC WITH DIFFERENTIAL/PLATELET
BASOS PCT: 1 %
Basophils Absolute: 0.1 10*3/uL (ref 0.0–0.1)
EOS ABS: 0.8 10*3/uL — AB (ref 0.0–0.7)
EOS PCT: 6 %
HEMATOCRIT: 24.4 % — AB (ref 36.0–46.0)
HEMOGLOBIN: 8.4 g/dL — AB (ref 12.0–15.0)
LYMPHS PCT: 29 %
Lymphs Abs: 3.9 10*3/uL (ref 0.7–4.0)
MCH: 30 pg (ref 26.0–34.0)
MCHC: 34.4 g/dL (ref 30.0–36.0)
MCV: 87.1 fL (ref 78.0–100.0)
MONOS PCT: 10 %
Monocytes Absolute: 1.3 10*3/uL — ABNORMAL HIGH (ref 0.1–1.0)
NEUTROS ABS: 7.3 10*3/uL (ref 1.7–7.7)
NEUTROS PCT: 54 %
PLATELETS: 596 10*3/uL — AB (ref 150–400)
RBC: 2.8 MIL/uL — ABNORMAL LOW (ref 3.87–5.11)
RDW: 17.3 % — ABNORMAL HIGH (ref 11.5–15.5)
WBC: 13.4 10*3/uL — ABNORMAL HIGH (ref 4.0–10.5)

## 2016-04-18 LAB — COMPREHENSIVE METABOLIC PANEL
ALK PHOS: 57 U/L (ref 38–126)
ALT: 13 U/L — AB (ref 14–54)
AST: 17 U/L (ref 15–41)
Albumin: 4.3 g/dL (ref 3.5–5.0)
Anion gap: 7 (ref 5–15)
BUN: 10 mg/dL (ref 6–20)
CALCIUM: 8.7 mg/dL — AB (ref 8.9–10.3)
CHLORIDE: 109 mmol/L (ref 101–111)
CO2: 25 mmol/L (ref 22–32)
CREATININE: 0.92 mg/dL (ref 0.44–1.00)
Glucose, Bld: 94 mg/dL (ref 65–99)
Potassium: 4.1 mmol/L (ref 3.5–5.1)
Sodium: 141 mmol/L (ref 135–145)
Total Bilirubin: 3.2 mg/dL — ABNORMAL HIGH (ref 0.3–1.2)
Total Protein: 7.1 g/dL (ref 6.5–8.1)

## 2016-04-18 MED ORDER — OXYCODONE HCL 15 MG PO TABS: 15.0000 mg | ORAL_TABLET | ORAL | 0 refills | Status: DC | PRN

## 2016-04-18 MED ORDER — NALOXONE HCL 0.4 MG/ML IJ SOLN: 0.4000 mg | INTRAMUSCULAR | Status: DC | PRN

## 2016-04-18 MED ORDER — DIPHENHYDRAMINE HCL 50 MG/ML IJ SOLN
25.0000 mg | INTRAMUSCULAR | Status: DC | PRN
  Filled 2016-04-18: qty 0.5

## 2016-04-18 MED ORDER — SODIUM CHLORIDE 0.9% FLUSH: 9.0000 mL | INTRAVENOUS | Status: DC | PRN

## 2016-04-18 MED ORDER — ONDANSETRON HCL 4 MG/2ML IJ SOLN: 4.0000 mg | Freq: Four times a day (QID) | INTRAMUSCULAR | Status: DC | PRN

## 2016-04-18 MED ORDER — SODIUM CHLORIDE 0.9% FLUSH
10.0000 mL | INTRAVENOUS | Status: DC | PRN
  Administered 2016-04-18: 10 mL
  Filled 2016-04-18: qty 40

## 2016-04-18 MED ORDER — HYDROMORPHONE 1 MG/ML IV SOLN
INTRAVENOUS | Status: DC
  Administered 2016-04-18: 3.6 mg via INTRAVENOUS
  Administered 2016-04-18: 2.4 mg via INTRAVENOUS
  Administered 2016-04-18: 1.8 mg via INTRAVENOUS
  Administered 2016-04-19: 4.2 mg via INTRAVENOUS
  Administered 2016-04-19: 6.6 mg via INTRAVENOUS
  Administered 2016-04-19: 1.2 mg via INTRAVENOUS
  Administered 2016-04-19: 1.8 mg via INTRAVENOUS
  Administered 2016-04-19: 0.6 mg via INTRAVENOUS
  Administered 2016-04-19: 12:00:00 via INTRAVENOUS
  Administered 2016-04-20: 3.6 mg via INTRAVENOUS
  Administered 2016-04-20: 3.2 mg via INTRAVENOUS
  Administered 2016-04-20: 4.2 mg via INTRAVENOUS
  Administered 2016-04-20: 10:00:00 via INTRAVENOUS
  Administered 2016-04-20: 6 mg via INTRAVENOUS
  Filled 2016-04-18 (×2): qty 25

## 2016-04-18 MED ORDER — HYDROMORPHONE HCL 2 MG/ML IJ SOLN
2.0000 mg | INTRAMUSCULAR | Status: DC | PRN
  Administered 2016-04-18 – 2016-04-20 (×12): 2 mg via INTRAVENOUS
  Filled 2016-04-18 (×12): qty 1

## 2016-04-18 MED ORDER — DIPHENHYDRAMINE HCL 25 MG PO CAPS
25.0000 mg | ORAL_CAPSULE | ORAL | Status: DC | PRN
  Administered 2016-04-18 – 2016-04-19 (×2): 50 mg via ORAL
  Filled 2016-04-18 (×2): qty 2

## 2016-04-18 MED ORDER — HYDROXYUREA 500 MG PO CAPS: 1000.0000 mg | ORAL_CAPSULE | Freq: Every day | ORAL | 3 refills | Status: DC

## 2016-04-18 NOTE — Progress Notes (Signed)
   04/18/16 1500  Clinical Encounter Type  Visited With Patient  Visit Type Psychological support  Counseling intern visited with patient to offer emotional support and remained with her as she transitioned between floors. Patient was groggy and dozed in and out of sleep for most of the session but asked counselor to stay with her to talk. She stated that she hasn't been getting sleep, that it is hard for her to sleep for any extended time at home because she only sleeps when they sleep, which is usually only briefly. Patient stated that she has continued to feel significant stress since her last admission.  Patient reported that she recently decided to move herself and her children to [facility] at the beginning of the year to live with her father, where she feels she will be more supported (especially regarding help with child care) and have better opportunities to pursue her dreams. She stated that her father has been inviting her to come to [facility] for a few years, but she has not gone out of fear of failing at trying something new. Patient identified her mother's criticism the primary reason for the move. When asked how she plans to navigate telling her mother about moving, she stated that she plans to pack up her belongings and leave at the appointed time without an explanation. Counselor noted patient pulling out her hair throughout the session and asked if she was feeling anxious. Patient stated that she felt fidgety but could not identify any anxiety-producing thoughts. Counselor will review treatment options (especially cognitive-behavioral techniques) to help patient reduce this behavior.  [name] A. Philbert Riser, Counseling Intern Department for Spiritual Care and [employer] Supervisor - Scientist, research (physical sciences)

## 2016-04-18 NOTE — Progress Notes (Signed)
Patient was up and bathing for over an hour tonight and did not want the telemetry on while she was bathing.

## 2016-04-18 NOTE — Consult Note (Signed)
   [facility] CM Inpatient Consult   04/18/2016  [name] 05-Oct-1990 [id number]   [facility] Care Management follow up. Spoke with Milan who indicates she has had difficulty in maintaining contact with [name] post  hospital discharge. Please see chart review then notes for further details from [place].   Spoke with [name] to discuss. She states " I have been having a lot going on and been having phone issues". States she would rather contact [place] Management if needed. Denies needs for ongoing attempts with [place] Management follow up at this time. Will make inpatient RNCM aware and will notify [place] team.  Marthenia Rolling, [place], RN,BSN [facility] Liaison [phone]

## 2016-04-18 NOTE — Telephone Encounter (Signed)
Refilled. Hydroxyurea e-prescribed.

## 2016-04-18 NOTE — Progress Notes (Signed)
SICKLE CELL SERVICE PROGRESS NOTE  [name] [id number] DOB: [birth year] DOA: 04/17/2016 PCP: Angelica Chessman, MD  Assessment/Plan: Principal Problem:   Sickle cell pain crisis ([place]) Active Problems:   GERD (gastroesophageal reflux disease)   Cocaine use   Nausea and vomiting   Narcotic dependence ([place])   Acute deep vein thrombosis (DVT) of left upper extremity ([place])  1. LUE Acute DVT: Pt started on Lovenox and will transition to either Xarelto or Eliquis tomorrow. She will need to be on anti-coagulation for 6 weeks. 2. Hb SS with crisis: Will adjust PCA to custom dose and change clinician assisted doses to 2 mg every 3 hours as needed. Continue Toradol and IVF.  3. Leukocytosis: Pt has a mild leukocytosis without any evidence of infection.  4. Anemia of chronic disease: Hb stable.  5. Chronic pain: Continue MS Contin.   Code Status: Full Code Family Communication: N/A Disposition Plan: Not yet ready for discharge  [place].  Pager [phone]. If 7PM-7AM, please contact night-coverage.  04/18/2016, 3:40 PM  LOS: 1 day   Interim History: Pt states that her pain is currently 8/10 and localized mostly to the LUE but also to the low back.   Consultants:  None  Procedures:  None  Antibiotics:  None   Objective: Vitals:   04/18/16 0737 04/18/16 1200 04/18/16 1308 04/18/16 1510  BP:   (!) 127/95   Pulse:   (!) 104   Resp: 14 16 18 13   Temp:   98.7 F (37.1 C)   TempSrc:   Oral   SpO2: 95% 95% 97% 94%  Weight:      Height:       Weight change:   Intake/Output Summary (Last 24 hours) at 04/18/16 1540 Last data filed at 04/18/16 0904  Gross per 24 hour  Intake           2082.5 ml  Output                0 ml  Net           2082.5 ml    General: Alert, awake, oriented x3, in no acute distress.  HEENT: [place]/AT PEERL, EOMI, anicteric Neck: Trachea midline,  no masses, no thyromegal,y no JVD, no carotid bruit OROPHARYNX:  Moist, No exudate/  erythema/lesions.  Heart: Regular rate and rhythm, without murmurs, rubs, gallops, PMI non-displaced, no heaves or thrills on palpation.  Lungs: Clear to auscultation, no wheezing or rhonchi noted. No increased vocal fremitus resonant to percussion  Abdomen: Soft, nontender, nondistended, positive bowel sounds, no masses no hepatosplenomegaly noted..  Neuro: No focal neurological deficits noted cranial nerves II through XII grossly intact.  Strength at functional baseline in bilateral upper and lower extremities. Musculoskeletal: No warm swelling or erythema around joints, no spinal tenderness noted. Psychiatric: Patient alert and oriented x3, good insight and cognition, good recent to remote recall.    Data Reviewed: Basic Metabolic Panel:  Recent Labs Lab 04/17/16 1339 04/18/16 0445  NA 137 141  K 3.6 4.1  CL 108 109  CO2 24 25  GLUCOSE 89 94  BUN 6 10  CREATININE 0.62 0.92  CALCIUM 8.8* 8.7*   Liver Function Tests:  Recent Labs Lab 04/18/16 0445  AST 17  ALT 13*  ALKPHOS 57  BILITOT 3.2*  PROT 7.1  ALBUMIN 4.3   No results for input(s): LIPASE, AMYLASE in the last 168 hours. No results for input(s): AMMONIA in the last 168 hours. CBC:  Recent Labs  Lab 04/17/16 1339 04/18/16 0445  WBC 11.2* 13.4*  NEUTROABS 7.0 7.3  HGB 9.5* 8.4*  HCT 27.8* 24.4*  MCV 85.3 87.1  PLT 633* 596*   Cardiac Enzymes: No results for input(s): CKTOTAL, CKMB, CKMBINDEX, TROPONINI in the last 168 hours. BNP (last 3 results) No results for input(s): BNP in the last 8760 hours.  ProBNP (last 3 results) No results for input(s): PROBNP in the last 8760 hours.  CBG: No results for input(s): GLUCAP in the last 168 hours.  No results found for this or any previous visit (from the past 240 hour(s)).   Studies: US Transvaginal Non-ob  Result Date: 04/03/2016 CLINICAL DATA:  Excessive vaginal bleeding. EXAM: TRANSABDOMINAL AND TRANSVAGINAL ULTRASOUND OF PELVIS TECHNIQUE: Both  transabdominal and transvaginal ultrasound examinations of the pelvis were performed. Transabdominal technique was performed for global imaging of the pelvis including uterus, ovaries, adnexal regions, and pelvic cul-de-sac. It was necessary to proceed with endovaginal exam following the transabdominal exam to visualize the uterus, ovaries, and adnexa. COMPARISON:  CT of 02/26/2016. FINDINGS: Uterus Measurements: 7.4 x 4.1 x 5.2 cm. No fibroids or other mass visualized. Endometrium Thickness: Normal, 3 mm.  No focal abnormality visualized. Right ovary Measurements: 2.0 x 1.4 x 1.5 cm. Normal in morphology. Left ovary Measurements: 4.0 x 2.4 x 2.2 cm. An anechoic lesion within measures 2.5 cm and is likely new since the prior CT. An area of curvilinear complexity within or adjacent to this lesion measures on the order of 5 mm. IMPRESSION: 1. No explanation for excessive vaginal bleeding. 2. Left ovarian lesion which is likely a complex dominant follicle or cyst, especially given absence of lesion in this area on 02/26/2016 CT. Given complexity within, consider follow-up with ultrasound at 6-12 weeks. Electronically Signed   By: Abigail Miyamoto M.D.   On: 04/03/2016 15:55   US Pelvis Complete  Result Date: 04/03/2016 CLINICAL DATA:  Excessive vaginal bleeding. EXAM: TRANSABDOMINAL AND TRANSVAGINAL ULTRASOUND OF PELVIS TECHNIQUE: Both transabdominal and transvaginal ultrasound examinations of the pelvis were performed. Transabdominal technique was performed for global imaging of the pelvis including uterus, ovaries, adnexal regions, and pelvic cul-de-sac. It was necessary to proceed with endovaginal exam following the transabdominal exam to visualize the uterus, ovaries, and adnexa. COMPARISON:  CT of 02/26/2016. FINDINGS: Uterus Measurements: 7.4 x 4.1 x 5.2 cm. No fibroids or other mass visualized. Endometrium Thickness: Normal, 3 mm.  No focal abnormality visualized. Right ovary Measurements: 2.0 x 1.4 x 1.5 cm.  Normal in morphology. Left ovary Measurements: 4.0 x 2.4 x 2.2 cm. An anechoic lesion within measures 2.5 cm and is likely new since the prior CT. An area of curvilinear complexity within or adjacent to this lesion measures on the order of 5 mm. IMPRESSION: 1. No explanation for excessive vaginal bleeding. 2. Left ovarian lesion which is likely a complex dominant follicle or cyst, especially given absence of lesion in this area on 02/26/2016 CT. Given complexity within, consider follow-up with ultrasound at 6-12 weeks. Electronically Signed   By: Abigail Miyamoto M.D.   On: 04/03/2016 15:55   Dg Humerus Left  Result Date: 04/17/2016 CLINICAL DATA:  Diffuse left arm pain. No known injury. Sickle cell disease. EXAM: LEFT HUMERUS - 2+ VIEW COMPARISON:  Left shoulder dated 05/06/2012 and 12/11/2011 and left humerus dated 03/11/2009. FINDINGS: Patchy sclerosis is again demonstrated in the humeral head. No other abnormalities are seen. 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 No acute abnormality. Electronically Signed  By: Claudie Revering M.D.   On: 04/17/2016 13:19    Scheduled Meds: . acetaminophen  1,000 mg Oral Once  . enoxaparin (LOVENOX) injection  1 mg/kg Subcutaneous Q12H  . HYDROmorphone   Intravenous Q4H  . hydroxyurea  500 mg Oral BID  . morphine  30 mg Oral Q12H  . pantoprazole  40 mg Oral Daily  . senna-docusate  1 tablet Oral BID  . sodium chloride flush  3 mL Intravenous Q12H   Continuous Infusions: . 0.45 % NaCl with KCl 20 mEq / L 125 mL/hr at 04/18/16 1221    Principal Problem:   Sickle cell pain crisis ([name]) Active Problems:   GERD (gastroesophageal reflux disease)   Cocaine use   Nausea and vomiting   Narcotic dependence ([place])   Acute deep vein thrombosis (DVT) of left upper extremity ([name])   In excess of 25 minutes spent during this visit. Greater than 50% involved face to face contact with the patient for assessment, counseling and coordination  of care.

## 2016-04-18 NOTE — Patient Outreach (Signed)
[facility]) Care Management  04/18/2016  OSIE EAST 15-Jan-1991 [id number]   Noted that member readmitted to [facility] liaison notified of admission.  Later notified by liaison that member has declined services.  Will notify care management assistant and primary MD of case closure.  Valente David, [place], MSN [place] [phone]

## 2016-04-18 NOTE — Progress Notes (Signed)
Pharmacy IV to PO conversion  The patient is receiving diphenhydramine by the intravenous route.  Based on the following criteria approved by the Pharmacy and [place], diphenhydramine is being converted to the equivalent oral dose form.   Not prescribed to treat or prevent a severe allergic reaction   Not prescribed as premedication prior to receiving blood product, biologic medication, antimicrobial, or chemotherapy agent   The patient has tolerated at least one dose of an oral or enteral medication   The patient has no evidence of active gastrointestinal bleeding or impaired GI absorption (gastrectomy, short bowel, patient on TNA or NPO).   The patient is not undergoing procedural sedation  If you have any questions about this conversion, please contact the Pharmacy Department (ext [phone]).  Thank you.  Reuel Boom, PharmD Pager: [phone] 04/18/2016, 12:04 PM

## 2016-04-19 DIAGNOSIS — D638 Anemia in other chronic diseases classified elsewhere: Secondary | ICD-10-CM

## 2016-04-19 MED ORDER — RIVAROXABAN 20 MG PO TABS: 20.0000 mg | ORAL_TABLET | Freq: Every day | ORAL | Status: DC

## 2016-04-19 MED ORDER — RIVAROXABAN 15 MG PO TABS
15.0000 mg | ORAL_TABLET | Freq: Two times a day (BID) | ORAL | Status: DC
  Administered 2016-04-19 – 2016-04-20 (×2): 15 mg via ORAL
  Filled 2016-04-19 (×3): qty 1

## 2016-04-19 NOTE — Progress Notes (Signed)
ANTICOAGULATION CONSULT NOTE - Initial Consult  Pharmacy Consult for xarelto Indication: VTE treatment  Allergies  Allergen Reactions  . Food Hives and Other (See Comments)    Pt states that she is allergic to carrots.   . Latex Rash    Patient Measurements: Height: 5\' 1"  (154.9 cm) Weight: 187 lb 6.3 oz (85 kg) IBW/kg (Calculated) : 47.8   Vital Signs: Temp: 99.1 F (37.3 C) (12/05 1330) Temp Source: Oral (12/05 1330) BP: 113/74 (12/05 1330) Pulse Rate: 107 (12/05 1330)  Labs:  Recent Labs  04/17/16 1339 04/18/16 0445  HGB 9.5* 8.4*  HCT 27.8* 24.4*  PLT 633* 596*  CREATININE 0.62 0.92    Estimated Creatinine Clearance: 93.3 mL/min (by C-G formula based on SCr of 0.92 mg/dL).   Medical History: Past Medical History:  Diagnosis Date  . Blood transfusion    "lots"  . Blood transfusion without reported diagnosis   . Chronic back pain    "very severe; have knot in my back; from tight muscle; take RX and exercise for it"  . Depression 01/06/2011  . Exertional dyspnea    "sometimes"  . Genital HSV   . GERD (gastroesophageal reflux disease) 02/17/2011  . Migraines 11/08/11   "@ least twice/month"  . 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  . Mood swings ([place]) 11/08/11   "I go back and forth; real bad"  . Sickle cell anemia ([name])   . Sickle cell anemia with crisis ([place])   . Trichotillomania    h/o     Assessment: 25 y.o. female with medical history significant of sickle cell disease, sickle cell anemia, migraine headaches, GERD, genital HSV, depression who came to the ED with complaints of left arm pain on 12/3, Doppler US of LUE showed non-occlusive DVT of the brachial vein.  Pt initially started on lovenox, now pharmacy consulted to dose xarelto.  Goal of Therapy:  Therapeutic anticoagulation  Dose appropriate for renal function  Plan:  xarelto 15mg  po twice daily for 21 days then xarelto 20mg  once daily with supper Pharmacy will provide  education  Dolly Rias RPh 04/19/2016, 2:55 PM Pager [phone]

## 2016-04-19 NOTE — Progress Notes (Signed)
IVFs hung at 1525 at a rate of 10cc/hr.  Entered patient room because pump was beeping and IVF were going at 125.  I stated to patient I just hung this bag at 10cc/hr and somehow its back to 125cc/hr.  Patient stated "I didn't touch it".  Will continue to monitor.

## 2016-04-19 NOTE — Progress Notes (Signed)
SICKLE CELL SERVICE PROGRESS NOTE  [name] [id number] DOB: Mar 20, 1991 DOA: 04/17/2016 PCP: Angelica Chessman, MD  Assessment/Plan: Principal Problem:   Sickle cell pain crisis ([place]) Active Problems:   GERD (gastroesophageal reflux disease)   Cocaine use   Nausea and vomiting   Narcotic dependence ([place])   Acute deep vein thrombosis (DVT) of left upper extremity ([place])  1. LUE Acute DVT: Pt started on Lovenox and will transition to either Xarelto today. She will need to be on anti-coagulation for 6 months. 2. Hb SS with crisis: Continue PCA and clinician assisted Dilaudid at current dose. Continue Toradol. Decrease IVF to [facility] as patient eating and drinking well.  3. Leukocytosis: Pt has a mild leukocytosis without any evidence of infection.  4. Anemia of chronic disease: Hb stable.  5. Chronic pain: Continue MS Contin.  6. Constipation: Pt has not had a BM for 2 days. Receiving Senakot-S and Miralax on a PRN basis.   Code Status: Full Code Family Communication: N/A Disposition Plan: Not yet ready for discharge  [place].  Pager [phone]. If 7PM-7AM, please contact night-coverage.  04/19/2016, 10:52 AM  LOS: 2 days   Interim History: Pt states that her pain is currently 7/10 and localized mostly to the LUE . Low back pain resolved. Last BM 2 day s ago. Pt reports a good nights sleep last night.  Consultants:  None  Procedures:  None  Antibiotics:  None   Objective: Vitals:   04/19/16 0457 04/19/16 0509 04/19/16 0830 04/19/16 1010  BP: 110/72   107/77  Pulse: (!) 105   96  Resp: 19 16 17 13   Temp: 98.7 F (37.1 C)   98.4 F (36.9 C)  TempSrc: Oral   Oral  SpO2: 97%  98% 96%  Weight: 85 kg (187 lb 6.3 oz)     Height:       Weight change: 0.177 kg (6.3 oz)  Intake/Output Summary (Last 24 hours) at 04/19/16 1052 Last data filed at 04/19/16 1010  Gross per 24 hour  Intake              480 ml  Output                0 ml  Net               480 ml    General: Alert, awake, oriented x3, in no acute distress. Well appearing. HEENT: [place]/AT PEERL, EOMI, anicteric Neck: Trachea midline,  no masses, no thyromegal,y no JVD, no carotid bruit OROPHARYNX:  Moist, No exudate/ erythema/lesions.  Heart: Regular rate and rhythm, without murmurs, rubs, gallops, PMI non-displaced, no heaves or thrills on palpation.  Lungs: Clear to auscultation, no wheezing or rhonchi noted. No increased vocal fremitus resonant to percussion  Abdomen: Soft, nontender, nondistended, positive bowel sounds, no masses no hepatosplenomegaly noted.  Neuro: No focal neurological deficits noted cranial nerves II through XII grossly intact.  Strength at functional baseline in bilateral upper and lower extremities. Musculoskeletal: No warm swelling or erythema around joints, no spinal tenderness noted. Psychiatric: Patient alert and oriented x3, good insight and cognition, good recent to remote recall.    Data Reviewed: Basic Metabolic Panel:  Recent Labs Lab 04/17/16 1339 04/18/16 0445  NA 137 141  K 3.6 4.1  CL 108 109  CO2 24 25  GLUCOSE 89 94  BUN 6 10  CREATININE 0.62 0.92  CALCIUM 8.8* 8.7*   Liver Function Tests:  Recent Labs Lab 04/18/16 0445  AST 17  ALT 13*  ALKPHOS 57  BILITOT 3.2*  PROT 7.1  ALBUMIN 4.3   No results for input(s): LIPASE, AMYLASE in the last 168 hours. No results for input(s): AMMONIA in the last 168 hours. CBC:  Recent Labs Lab 04/17/16 1339 04/18/16 0445  WBC 11.2* 13.4*  NEUTROABS 7.0 7.3  HGB 9.5* 8.4*  HCT 27.8* 24.4*  MCV 85.3 87.1  PLT 633* 596*   Cardiac Enzymes: No results for input(s): CKTOTAL, CKMB, CKMBINDEX, TROPONINI in the last 168 hours. BNP (last 3 results) No results for input(s): BNP in the last 8760 hours.  ProBNP (last 3 results) No results for input(s): PROBNP in the last 8760 hours.  CBG: No results for input(s): GLUCAP in the last 168 hours.  No results found for this or any  previous visit (from the past 240 hour(s)).   Studies: US Transvaginal Non-ob  Result Date: 04/03/2016 CLINICAL DATA:  Excessive vaginal bleeding. EXAM: TRANSABDOMINAL AND TRANSVAGINAL ULTRASOUND OF PELVIS TECHNIQUE: Both transabdominal and transvaginal ultrasound examinations of the pelvis were performed. Transabdominal technique was performed for global imaging of the pelvis including uterus, ovaries, adnexal regions, and pelvic cul-de-sac. It was necessary to proceed with endovaginal exam following the transabdominal exam to visualize the uterus, ovaries, and adnexa. COMPARISON:  CT of 02/26/2016. FINDINGS: Uterus Measurements: 7.4 x 4.1 x 5.2 cm. No fibroids or other mass visualized. Endometrium Thickness: Normal, 3 mm.  No focal abnormality visualized. Right ovary Measurements: 2.0 x 1.4 x 1.5 cm. Normal in morphology. Left ovary Measurements: 4.0 x 2.4 x 2.2 cm. An anechoic lesion within measures 2.5 cm and is likely new since the prior CT. An area of curvilinear complexity within or adjacent to this lesion measures on the order of 5 mm. IMPRESSION: 1. No explanation for excessive vaginal bleeding. 2. Left ovarian lesion which is likely a complex dominant follicle or cyst, especially given absence of lesion in this area on 02/26/2016 CT. Given complexity within, consider follow-up with ultrasound at 6-12 weeks. Electronically Signed   By: Abigail Miyamoto M.D.   On: 04/03/2016 15:55   US Pelvis Complete  Result Date: 04/03/2016 CLINICAL DATA:  Excessive vaginal bleeding. EXAM: TRANSABDOMINAL AND TRANSVAGINAL ULTRASOUND OF PELVIS TECHNIQUE: Both transabdominal and transvaginal ultrasound examinations of the pelvis were performed. Transabdominal technique was performed for global imaging of the pelvis including uterus, ovaries, adnexal regions, and pelvic cul-de-sac. It was necessary to proceed with endovaginal exam following the transabdominal exam to visualize the uterus, ovaries, and adnexa.  COMPARISON:  CT of 02/26/2016. FINDINGS: Uterus Measurements: 7.4 x 4.1 x 5.2 cm. No fibroids or other mass visualized. Endometrium Thickness: Normal, 3 mm.  No focal abnormality visualized. Right ovary Measurements: 2.0 x 1.4 x 1.5 cm. Normal in morphology. Left ovary Measurements: 4.0 x 2.4 x 2.2 cm. An anechoic lesion within measures 2.5 cm and is likely new since the prior CT. An area of curvilinear complexity within or adjacent to this lesion measures on the order of 5 mm. IMPRESSION: 1. No explanation for excessive vaginal bleeding. 2. Left ovarian lesion which is likely a complex dominant follicle or cyst, especially given absence of lesion in this area on 02/26/2016 CT. Given complexity within, consider follow-up with ultrasound at 6-12 weeks. Electronically Signed   By: Abigail Miyamoto M.D.   On: 04/03/2016 15:55   Dg Humerus Left  Result Date: 04/17/2016 CLINICAL DATA:  Diffuse left arm pain. No known injury. Sickle cell disease. EXAM: LEFT HUMERUS - 2+ VIEW COMPARISON:  Left shoulder dated 05/06/2012 and 12/11/2011 and left humerus dated 03/11/2009. FINDINGS: Patchy sclerosis is again demonstrated in the humeral head. No other abnormalities are seen. 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 No acute abnormality. Electronically Signed   By: Claudie Revering M.D.   On: 04/17/2016 13:19    Scheduled Meds: . acetaminophen  1,000 mg Oral Once  . enoxaparin (LOVENOX) injection  1 mg/kg Subcutaneous Q12H  . HYDROmorphone   Intravenous Q4H  . hydroxyurea  500 mg Oral BID  . morphine  30 mg Oral Q12H  . pantoprazole  40 mg Oral Daily  . senna-docusate  1 tablet Oral BID  . sodium chloride flush  3 mL Intravenous Q12H   Continuous Infusions: . 0.45 % NaCl with KCl 20 mEq / L 125 mL/hr at 04/19/16 0830    Principal Problem:   Sickle cell pain crisis ([name]) Active Problems:   GERD (gastroesophageal reflux disease)   Cocaine use   Nausea and vomiting   Narcotic  dependence ([place])   Acute deep vein thrombosis (DVT) of left upper extremity ([name])   In excess of 25 minutes spent during this visit. Greater than 50% involved face to face contact with the patient for assessment, counseling and coordination of care.

## 2016-04-19 NOTE — Consult Note (Signed)
   [facility] CM Inpatient Consult   04/19/2016  KIMIAH COSENTINO 13-Jan-1991 [id number]    [facility] Care Management bedside visit. Spoke with Claurissa who endorses that she has multiple cell phones that have had issues. She reiterates that she has had "alot going on" and usually too busy to talk when [facility] calls. Provided Claurissa with writer's personal work cell number to call if and when she is ready to be engaged with [place] Management services once she gets home.  Marthenia Rolling, MSN-Ed, RN,BSN [facility] Liaison [phone]

## 2016-04-20 ENCOUNTER — Ambulatory Visit (HOSPITAL_COMMUNITY): Payer: Medicaid Other | Admitting: Licensed Clinical Social Worker

## 2016-04-20 MED ORDER — OXYCODONE HCL 5 MG PO TABS
15.0000 mg | ORAL_TABLET | ORAL | Status: DC
  Administered 2016-04-20 (×2): 15 mg via ORAL
  Filled 2016-04-20 (×2): qty 3

## 2016-04-20 MED ORDER — HEPARIN SOD (PORK) LOCK FLUSH 100 UNIT/ML IV SOLN
500.0000 [IU] | INTRAVENOUS | Status: AC | PRN
  Administered 2016-04-20: 500 [IU]
  Filled 2016-04-20: qty 5

## 2016-04-20 MED ORDER — LIP MEDEX EX OINT
TOPICAL_OINTMENT | CUTANEOUS | Status: AC
  Administered 2016-04-20: 16:00:00
  Filled 2016-04-20: qty 7

## 2016-04-20 MED ORDER — RIVAROXABAN (XARELTO) VTE STARTER PACK (15 & 20 MG): ORAL_TABLET | ORAL | 0 refills | Status: DC

## 2016-04-20 NOTE — Progress Notes (Signed)
Nursing Discharge Summary  Patient ID: NAEVIA MACARIO MRN: [id number] DOB/AGE: 09/16/1990 25 y.o.  Admit date: 04/17/2016 Discharge date: 04/20/2016  Discharged Condition: good  Disposition: 01-Home or Self Care    Prescriptions Given: Prescription for Oxycodone IR and Xarelto given.  Patient verbalized understanding of follow up appointments.    Means of Discharge: Patient chose to ambulate downstairs to be discharged home via private vehicle.    Signed: Buel Ream 04/20/2016, 4:06 PM

## 2016-04-20 NOTE — Progress Notes (Signed)
Called to the room by the patient to state that her IVFs were empty.  On bedside report the patient's IVF rate was going at 10cc/hr with a volume of 60cc left.  I asked the patient what 60/10 was which she answered 6.  I explained that that means that her IVFs should have lasted 6 more hours and it had only been 30 mins since I left from doing bedside report.  Explained that I didn't understand what was going on with her pump but it was clearly being manipulated.  Patient stated "I know what it looks like and you talked to me about this yesterday".  I explained that it was a safety issue and pump shouldn't be manipulated.

## 2016-04-20 NOTE — Care Management Note (Signed)
Case Management Note  Patient Details  Name: [name] MRN: [id number] Date of Birth: 1990/05/26  Subjective/Objective:       25 yo admitted with [facility]             Action/Plan: From home with children. Chart reviewed and CM following for DC needs.  Expected Discharge Date:                  Expected Discharge Plan:     In-House Referral:     Discharge planning Services     Post Acute Care Choice:    Choice offered to:     DME Arranged:    DME Agency:     HH Arranged:    HH Agency:     Status of Service:     If discussed at [employer], dates discussed:    Additional CommentsLynnell Catalan, RN 04/20/2016, 1:41 PM  [phone]

## 2016-04-20 NOTE — Discharge Summary (Signed)
[name] MRN: [id number] DOB/AGE: 24-22-1992 25 y.o.  Admit date: 04/17/2016 Discharge date: 04/20/2016  Primary Care Physician:  Angelica Chessman, MD   Discharge Diagnoses:   Patient Active Problem List   Diagnosis Date Noted  . Major depression, chronic 01/06/2011    Priority: High  . Sickle cell disease ([place]) 01/08/2009    Priority: High  . Acute deep vein thrombosis (DVT) of left upper extremity ([place]) 04/17/2016  . Post partum depression 04/03/2016  . Syncope   . Episode of recurrent major depressive disorder ([place])   . Fall during current hospitalization   . Hb-SS disease with crisis ([place]) 02/26/2016  . Sickle cell anemia with pain ([place]) 02/16/2016  . Chronic back pain   . Sickle cell anemia with crisis ([place]) 01/05/2016  . Nausea & vomiting 12/27/2015  . Sickle cell crisis ([place]) 11/09/2015  . Sepsis ([place]) 11/09/2015  . Thrombocytosis ([place]) 11/09/2015  . Vaginal bleeding 11/09/2015  . Hyperbilirubinemia 11/09/2015  . Pyrexia   . Vaginal delivery 10/31/2015  . Sickle cell pain crisis ([place]) 10/26/2015  . Positive GBS test 10/18/2015  . Narcotic dependence ([place]) 10/06/2015  . 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 Nausea and vomiting 09/07/2015  . High risk for intrapartum complications, antepartum 08/12/2015  . Chronic pain 08/04/2015  . Cocaine use 07/24/2015  . Maternal sickle cell anemia affecting pregnancy, antepartum ([place]) 07/14/2015  . Anemia of chronic disease   . Hypokalemia 10/29/2014  . Hb-SS disease without crisis ([place]) 02/26/2013  . Migraines 11/08/2011  . GERD (gastroesophageal reflux disease) 02/17/2011  . Trichotillomania 01/08/2009    DISCHARGE MEDICATION:   Medication List    STOP taking these medications   megestrol 40 MG tablet Commonly known as:  MEGACE     TAKE these medications   aspirin-acetaminophen-caffeine 250-250-65 MG  tablet Commonly known as:  EXCEDRIN MIGRAINE Take 2 tablets by mouth every 6 (six) hours as needed for headache.   cyclobenzaprine 10 MG tablet Commonly known as:  FLEXERIL Take 1 tablet (10 mg total) by mouth 3 (three) times daily as needed for muscle spasms.   DULoxetine 30 MG capsule Commonly known as:  CYMBALTA Take 1 capsule (30 mg total) by mouth daily.   hydroxyurea 500 MG capsule Commonly known as:  HYDREA Take 2 capsules (1,000 mg total) by mouth daily. May take with food to minimize GI side effects. What changed:  how much to take  when to take this  additional instructions   ibuprofen 600 MG tablet Commonly known as:  ADVIL,MOTRIN Take 1 tablet (600 mg total) by mouth every 6 (six) hours as needed for mild pain or moderate pain.   morphine 30 MG 12 hr tablet Commonly known as:  MS CONTIN Take 1 tablet (30 mg total) by mouth every 12 (twelve) hours.   oxyCODONE 15 MG immediate release tablet Commonly known as:  ROXICODONE Take 1 tablet (15 mg total) by mouth every 4 (four) hours as needed for pain.   Rivaroxaban 15 & 20 MG Tbpk Commonly known as:  XARELTO STARTER PACK Take as directed on package: Start with one 15mg  tablet by mouth twice a day with food. On Day 22, switch to one 20mg  tablet once a day with food.         Consults:    SIGNIFICANT DIAGNOSTIC STUDIES:  US Transvaginal Non-ob  Result Date: 04/03/2016 CLINICAL DATA:  Excessive vaginal bleeding. EXAM: TRANSABDOMINAL AND TRANSVAGINAL  ULTRASOUND OF PELVIS TECHNIQUE: Both transabdominal and transvaginal ultrasound examinations of the pelvis were performed. Transabdominal technique was performed for global imaging of the pelvis including uterus, ovaries, adnexal regions, and pelvic cul-de-sac. It was necessary to proceed with endovaginal exam following the transabdominal exam to visualize the uterus, ovaries, and adnexa. COMPARISON:  CT of 02/26/2016. FINDINGS: Uterus Measurements: 7.4 x 4.1 x 5.2  cm. No fibroids or other mass visualized. Endometrium Thickness: Normal, 3 mm.  No focal abnormality visualized. Right ovary Measurements: 2.0 x 1.4 x 1.5 cm. Normal in morphology. Left ovary Measurements: 4.0 x 2.4 x 2.2 cm. An anechoic lesion within measures 2.5 cm and is likely new since the prior CT. An area of curvilinear complexity within or adjacent to this lesion measures on the order of 5 mm. IMPRESSION: 1. No explanation for excessive vaginal bleeding. 2. Left ovarian lesion which is likely a complex dominant follicle or cyst, especially given absence of lesion in this area on 02/26/2016 CT. Given complexity within, consider follow-up with ultrasound at 6-12 weeks. Electronically Signed   By: Abigail Miyamoto M.D.   On: 04/03/2016 15:55   US Pelvis Complete  Result Date: 04/03/2016 CLINICAL DATA:  Excessive vaginal bleeding. EXAM: TRANSABDOMINAL AND TRANSVAGINAL ULTRASOUND OF PELVIS TECHNIQUE: Both transabdominal and transvaginal ultrasound examinations of the pelvis were performed. Transabdominal technique was performed for global imaging of the pelvis including uterus, ovaries, adnexal regions, and pelvic cul-de-sac. It was necessary to proceed with endovaginal exam following the transabdominal exam to visualize the uterus, ovaries, and adnexa. COMPARISON:  CT of 02/26/2016. FINDINGS: Uterus Measurements: 7.4 x 4.1 x 5.2 cm. No fibroids or other mass visualized. Endometrium Thickness: Normal, 3 mm.  No focal abnormality visualized. Right ovary Measurements: 2.0 x 1.4 x 1.5 cm. Normal in morphology. Left ovary Measurements: 4.0 x 2.4 x 2.2 cm. An anechoic lesion within measures 2.5 cm and is likely new since the prior CT. An area of curvilinear complexity within or adjacent to this lesion measures on the order of 5 mm. IMPRESSION: 1. No explanation for excessive vaginal bleeding. 2. Left ovarian lesion which is likely a complex dominant follicle or cyst, especially given absence of lesion in this area on  02/26/2016 CT. Given complexity within, consider follow-up with ultrasound at 6-12 weeks. Electronically Signed   By: Abigail Miyamoto M.D.   On: 04/03/2016 15:55   Dg Humerus Left  Result Date: 04/17/2016 CLINICAL DATA:  Diffuse left arm pain. No known injury. Sickle cell disease. EXAM: LEFT HUMERUS - 2+ VIEW COMPARISON:  Left shoulder dated 05/06/2012 and 12/11/2011 and left humerus dated 03/11/2009. FINDINGS: Patchy sclerosis is again demonstrated in the humeral head. No other abnormalities are seen. 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 No acute abnormality. Electronically Signed   By: Claudie Revering M.D.   On: 04/17/2016 13:19      No results found for this or any previous visit (from the past 240 hour(s)).  BRIEF ADMITTING H & P:  [name] is a 25 y.o. female with medical history significant of sickle cell disease, sickle cell anemia, migraine headaches, GERD, genital HSV, depression who is coming to the emergency department with complaints of left arm pain since yesterday which did not respond to her MS Contin and oxycodone IR at home. She denies fever, chills, but feels fatigue. She denies headache, sore throat, productive cough, dyspnea, chest pain, palpitations, dizziness, diaphoresis, abdominal pain, diarrhea, constipation, melena, hematochezia, dysuria or hematuria.  ED Course: The  patient received IV fluids, supplemental oxygen and multiple rounds of hydromorphone IV. Her workup showed WBCs of 11.2, hemoglobin of 9.5 g/dL, platelet 633, positive opiates and cocaine on urine toxicology. Doppler US of LUE showed non-occlusive DVT of the brachial vein.   Hospital Course:  Present on Admission: . Sickle cell pain crisis ([place]) . Acute deep vein thrombosis (DVT) of left upper extremity ([place]) . Nausea and vomiting . Narcotic dependence ([place]) Pt was diagnosed with an acute LUE DVT and started on Lovenox. She was transitioned to Xarelto and received  information on Xarelto from Pharmacist. She is discharged home on Xarelto 15 mg BID for 21 days then to transition to 20 mg daily to complete 6 months of therapy. Pt also had sickle cell crisis and was treated with IV Dilaudid via PCA. She was transitioned to Oxycodone today. She insisted on being discharged home before a reasonable time of observation could be had on the Oxycodone as she stated that she had no-one to care for her 2 infant children. She is discharged home on Oxycodone and MS Contin. Of note she admitted to tampering with the IV rate on her PCA on one instance today and nurse reported that rate was noted to be changed at least on one other instance today. Pt was counseled not to tamper with rate on the IV. Drug urine screen was found to be positive. However patient denies use since last discharge  Disposition and Follow-up: Pt is discharged home in stable condition and is to follow up with her PMD on 04/26/2016. She also has an appointment with OB-Gyn tomorrow 04/21/2016.    DISCHARGE EXAM:  General: Alert, awake, oriented x3, in no apparent distress.  Vital Signs : Per nurse patient refused vital signs being taken.   HEENT: [place]/AT PEERL, EOMI, anicteric Neck: Trachea midline, no masses, no thyromegal,y no JVD, no carotid bruit OROPHARYNX: Moist, No exudate/ erythema/lesions.  Heart: Regular rate and rhythm, without murmurs, rubs, gallops or S3. PMI non-displaced. Exam reveals no decreased pulses. Pulmonary/Chest: Normal effort. Breath sounds normal. No. Apnea. Clear to auscultation,no stridor,  no wheezing and no rhonchi noted. No respiratory distress and no tenderness noted. Abdomen: Soft, nontender, nondistended, normal bowel sounds, no masses no hepatosplenomegaly noted. No fluid wave and no ascites. There is no guarding or rebound. Neuro: Alert and oriented to person, place and time. Normal motor skills, Displays no atrophy or tremors and exhibits normal muscle tone.  No focal  neurological deficits noted cranial nerves II through XII grossly intact. No sensory deficit noted. Strength at baseline in bilateral upper and lower extremities. Gait normal. Musculoskeletal: No warmth swelling or erythema around joints, no spinal tenderness noted. Mild swelling of Left arm.  Psychiatric: Patient alert and oriented x3, good insight and cognition, good recent to remote recall.Mood, affect and judgement normal Lymph node survey: No cervical axillary or inguinal lymphadenopathy noted. Skin: Skin is warm and dry. No bruising, no ecchymosis and no rash noted. Pt is not diaphoretic. No erythema. No pallor      Recent Labs  04/17/16 1339 04/18/16 0445  NA 137 141  K 3.6 4.1  CL 108 109  CO2 24 25  GLUCOSE 89 94  BUN 6 10  CREATININE 0.62 0.92  CALCIUM 8.8* 8.7*    Recent Labs  04/18/16 0445  AST 17  ALT 13*  ALKPHOS 57  BILITOT 3.2*  PROT 7.1  ALBUMIN 4.3   No results for input(s): LIPASE, AMYLASE in the last 72 hours.  Recent Labs  04/17/16 1339 04/18/16 0445  WBC 11.2* 13.4*  NEUTROABS 7.0 7.3  HGB 9.5* 8.4*  HCT 27.8* 24.4*  MCV 85.3 87.1  PLT 633* 596*     Total time spent including face to face and decision making was greater than 30 minutes  Signed: Lynelle Weiler A. 04/20/2016, 12:51 PM

## 2016-04-20 NOTE — Discharge Instructions (Signed)
Information on my medicine - XARELTO (rivaroxaban)  This medication education was reviewed with me or my healthcare representative as part of my discharge preparation.  The pharmacist that spoke with me during my hospital stay was:  Angela Adam, [place]? Xarelto was prescribed to treat blood clots that may have been found in the veins of your legs (deep vein thrombosis) or in your lungs (pulmonary embolism) and to reduce the risk of them occurring again.  What do you need to know about Xarelto? The starting dose is one 15 mg tablet taken TWICE daily with food for the FIRST 21 DAYS then on (enter date)  05/11/16  the dose is changed to one 20 mg tablet taken ONCE A DAY with your evening meal.  DO NOT stop taking Xarelto without talking to the health care provider who prescribed the medication.  Refill your prescription for 20 mg tablets before you run out.  After discharge, you should have regular check-up appointments with your healthcare provider that is prescribing your Xarelto.  In the future your dose may need to be changed if your kidney function changes by a significant amount.  What do you do if you miss a dose? If you are taking Xarelto TWICE DAILY and you miss a dose, take it as soon as you remember. You may take two 15 mg tablets (total 30 mg) at the same time then resume your regularly scheduled 15 mg twice daily the next day.  If you are taking Xarelto ONCE DAILY and you miss a dose, take it as soon as you remember on the same day then continue your regularly scheduled once daily regimen the next day. Do not take two doses of Xarelto at the same time.   Important Safety Information Xarelto is a blood thinner medicine that can cause bleeding. You should call your healthcare provider right away if you experience any of the following: ? Bleeding from an injury or your nose that does not stop. ? Unusual colored urine (red or dark brown) or  unusual colored stools (red or black). ? Unusual bruising for unknown reasons. ? A serious fall or if you hit your head (even if there is no bleeding).  Some medicines may interact with Xarelto and might increase your risk of bleeding while on Xarelto. To help avoid this, consult your healthcare provider or pharmacist prior to using any new prescription or non-prescription medications, including herbals, vitamins, non-steroidal anti-inflammatory drugs (NSAIDs) and supplements.  This website has more information on Xarelto: [email or web address]

## 2016-04-20 NOTE — Progress Notes (Signed)
Entered patient's room to find that the IVF rate was turned up to 555cc/hr.  Woke patient up and asked her to look at her pump.  Patient stated "I did change it this time but I didn't the other times."  The IVF rate was decreased back down to 10cc/hr.  Notified the MD and the IV pump was then turned off.  Educated patient that she couldn't be allowed to continue to manipulate her pump because it was putting her safety at risk.

## 2016-04-21 ENCOUNTER — Ambulatory Visit: Payer: Medicare Other | Admitting: Obstetrics & Gynecology

## 2016-04-24 ENCOUNTER — Inpatient Hospital Stay (HOSPITAL_COMMUNITY)
Admission: EM | Admit: 2016-04-24 | Discharge: 2016-05-02 | DRG: 812 | Disposition: A | Discharge status: Home / Self Care | Payer: Medicare Other | Attending: Internal Medicine | Admitting: Internal Medicine

## 2016-04-24 ENCOUNTER — Encounter (HOSPITAL_COMMUNITY): Payer: Self-pay | Admitting: Emergency Medicine

## 2016-04-24 DIAGNOSIS — Z7901 Long term (current) use of anticoagulants: Secondary | ICD-10-CM | POA: Diagnosis not present

## 2016-04-24 DIAGNOSIS — D72829 Elevated white blood cell count, unspecified: Secondary | ICD-10-CM | POA: Diagnosis not present

## 2016-04-24 DIAGNOSIS — Z87891 Personal history of nicotine dependence: Secondary | ICD-10-CM

## 2016-04-24 DIAGNOSIS — Z9114 Patient's other noncompliance with medication regimen: Secondary | ICD-10-CM

## 2016-04-24 DIAGNOSIS — I82622 Acute embolism and thrombosis of deep veins of left upper extremity: Secondary | ICD-10-CM | POA: Diagnosis present

## 2016-04-24 DIAGNOSIS — Z7982 Long term (current) use of aspirin: Secondary | ICD-10-CM | POA: Diagnosis not present

## 2016-04-24 DIAGNOSIS — F331 Major depressive disorder, recurrent, moderate: Secondary | ICD-10-CM

## 2016-04-24 DIAGNOSIS — M549 Dorsalgia, unspecified: Secondary | ICD-10-CM | POA: Diagnosis present

## 2016-04-24 DIAGNOSIS — G894 Chronic pain syndrome: Secondary | ICD-10-CM | POA: Diagnosis present

## 2016-04-24 DIAGNOSIS — D638 Anemia in other chronic diseases classified elsewhere: Secondary | ICD-10-CM | POA: Diagnosis not present

## 2016-04-24 DIAGNOSIS — Z91018 Allergy to other foods: Secondary | ICD-10-CM | POA: Diagnosis not present

## 2016-04-24 DIAGNOSIS — Z79899 Other long term (current) drug therapy: Secondary | ICD-10-CM | POA: Diagnosis not present

## 2016-04-24 DIAGNOSIS — F329 Major depressive disorder, single episode, unspecified: Secondary | ICD-10-CM

## 2016-04-24 DIAGNOSIS — K5903 Drug induced constipation: Secondary | ICD-10-CM | POA: Diagnosis not present

## 2016-04-24 DIAGNOSIS — K5909 Other constipation: Secondary | ICD-10-CM | POA: Diagnosis present

## 2016-04-24 DIAGNOSIS — Z91040 Latex allergy status: Secondary | ICD-10-CM

## 2016-04-24 DIAGNOSIS — D5700 Hb-SS disease with crisis, unspecified: Secondary | ICD-10-CM | POA: Diagnosis not present

## 2016-04-24 DIAGNOSIS — G8929 Other chronic pain: Secondary | ICD-10-CM | POA: Diagnosis present

## 2016-04-24 DIAGNOSIS — D57219 Sickle-cell/Hb-C disease with crisis, unspecified: Secondary | ICD-10-CM | POA: Diagnosis not present

## 2016-04-24 LAB — COMPREHENSIVE METABOLIC PANEL
ALK PHOS: 51 U/L (ref 38–126)
ALT: 22 U/L (ref 14–54)
ANION GAP: 9 (ref 5–15)
AST: 18 U/L (ref 15–41)
Albumin: 4.7 g/dL (ref 3.5–5.0)
BUN: 6 mg/dL (ref 6–20)
CALCIUM: 9.4 mg/dL (ref 8.9–10.3)
CHLORIDE: 107 mmol/L (ref 101–111)
CO2: 23 mmol/L (ref 22–32)
Creatinine, Ser: 0.62 mg/dL (ref 0.44–1.00)
GFR calc non Af Amer: >60 mL/min (ref >60)
Glucose, Bld: 102 mg/dL — ABNORMAL HIGH (ref 65–99)
Potassium: 3.7 mmol/L (ref 3.5–5.1)
SODIUM: 139 mmol/L (ref 135–145)
Total Bilirubin: 2.3 mg/dL — ABNORMAL HIGH (ref 0.3–1.2)
Total Protein: 7.6 g/dL (ref 6.5–8.1)

## 2016-04-24 LAB — CBC WITH DIFFERENTIAL/PLATELET
Basophils Absolute: 0 10*3/uL (ref 0.0–0.1)
Basophils Relative: 0 %
Eosinophils Absolute: 0 10*3/uL (ref 0.0–0.7)
Eosinophils Relative: 0 %
HCT: 25.7 % — ABNORMAL LOW (ref 36.0–46.0)
Hemoglobin: 8.9 g/dL — ABNORMAL LOW (ref 12.0–15.0)
Lymphocytes Relative: 17 %
Lymphs Abs: 2.1 10*3/uL (ref 0.7–4.0)
MCH: 30.1 pg (ref 26.0–34.0)
MCHC: 34.6 g/dL (ref 30.0–36.0)
MCV: 86.8 fL (ref 78.0–100.0)
Monocytes Absolute: 1 10*3/uL (ref 0.1–1.0)
Monocytes Relative: 8 %
Neutro Abs: 8.9 10*3/uL — ABNORMAL HIGH (ref 1.7–7.7)
Neutrophils Relative %: 75 %
Platelets: 692 10*3/uL — ABNORMAL HIGH (ref 150–400)
RBC: 2.96 MIL/uL — ABNORMAL LOW (ref 3.87–5.11)
RDW: 19.2 % — ABNORMAL HIGH (ref 11.5–15.5)
WBC: 12 10*3/uL — ABNORMAL HIGH (ref 4.0–10.5)

## 2016-04-24 LAB — RAPID URINE DRUG SCREEN, HOSP PERFORMED
AMPHETAMINES: NOT DETECTED
Barbiturates: NOT DETECTED
Benzodiazepines: NOT DETECTED
Cocaine: POSITIVE — AB
Opiates: POSITIVE — AB
TETRAHYDROCANNABINOL: NOT DETECTED

## 2016-04-24 LAB — I-STAT BETA HCG BLOOD, ED (MC, WL, AP ONLY): I-stat hCG, quantitative: <5 m[IU]/mL (ref <5)

## 2016-04-24 LAB — RETICULOCYTES
RBC.: 2.96 MIL/uL — ABNORMAL LOW (ref 3.87–5.11)
RETIC COUNT ABSOLUTE: 497.3 10*3/uL — AB (ref 19.0–186.0)
RETIC CT PCT: 16.8 % — AB (ref 0.4–3.1)

## 2016-04-24 MED ORDER — HYDROMORPHONE 1 MG/ML IV SOLN
INTRAVENOUS | Status: DC
  Administered 2016-04-24: 6.9 mg via INTRAVENOUS
  Administered 2016-04-24: 9.7 mg via INTRAVENOUS
  Administered 2016-04-24 (×2): via INTRAVENOUS
  Administered 2016-04-25: 2 mg via INTRAVENOUS
  Administered 2016-04-25: 9.6 mg via INTRAVENOUS
  Administered 2016-04-25: 9 mg via INTRAVENOUS
  Administered 2016-04-25: 3.5 mg via INTRAVENOUS
  Administered 2016-04-25: 8.9 mg via INTRAVENOUS
  Administered 2016-04-25: 3.4 mg via INTRAVENOUS
  Administered 2016-04-25: 14:00:00 via INTRAVENOUS
  Administered 2016-04-25: 2 mg via INTRAVENOUS
  Administered 2016-04-25: 8.3 mg via INTRAVENOUS
  Administered 2016-04-26: 10:00:00 via INTRAVENOUS
  Administered 2016-04-26: 5.2 mg via INTRAVENOUS
  Administered 2016-04-26: 2 mg via INTRAVENOUS
  Administered 2016-04-26: 5.6 mg via INTRAVENOUS
  Administered 2016-04-26: 2.7 mg via INTRAVENOUS
  Administered 2016-04-26: 2.1 mg via INTRAVENOUS
  Filled 2016-04-24 (×4): qty 25

## 2016-04-24 MED ORDER — SODIUM CHLORIDE 0.9% FLUSH: 9.0000 mL | INTRAVENOUS | Status: DC | PRN

## 2016-04-24 MED ORDER — HYDROMORPHONE HCL 2 MG/ML IJ SOLN: 2.0000 mg | INTRAMUSCULAR | Status: DC

## 2016-04-24 MED ORDER — DEXTROSE-NACL 5-0.45 % IV SOLN
INTRAVENOUS | Status: DC
  Administered 2016-04-24 – 2016-04-25 (×4): via INTRAVENOUS
  Administered 2016-04-25 – 2016-04-26 (×2): 1000 mL via INTRAVENOUS
  Administered 2016-04-26: 17:00:00 via INTRAVENOUS
  Administered 2016-04-27: 50 mL/h via INTRAVENOUS
  Administered 2016-04-27: 1000 mL via INTRAVENOUS
  Administered 2016-04-28 – 2016-04-30 (×4): via INTRAVENOUS

## 2016-04-24 MED ORDER — KETOROLAC TROMETHAMINE 30 MG/ML IJ SOLN
30.0000 mg | Freq: Four times a day (QID) | INTRAMUSCULAR | Status: AC
  Administered 2016-04-24 – 2016-04-29 (×20): 30 mg via INTRAVENOUS
  Filled 2016-04-24 (×20): qty 1

## 2016-04-24 MED ORDER — CYCLOBENZAPRINE HCL 10 MG PO TABS
10.0000 mg | ORAL_TABLET | Freq: Three times a day (TID) | ORAL | Status: DC | PRN
  Administered 2016-04-28: 10 mg via ORAL
  Filled 2016-04-24: qty 1

## 2016-04-24 MED ORDER — ONDANSETRON HCL 4 MG/2ML IJ SOLN
4.0000 mg | INTRAMUSCULAR | Status: DC | PRN
  Administered 2016-04-24: 4 mg via INTRAVENOUS
  Filled 2016-04-24: qty 2

## 2016-04-24 MED ORDER — MORPHINE SULFATE ER 30 MG PO TBCR
30.0000 mg | EXTENDED_RELEASE_TABLET | Freq: Two times a day (BID) | ORAL | Status: DC
  Administered 2016-04-24 – 2016-05-02 (×17): 30 mg via ORAL
  Filled 2016-04-24 (×17): qty 1

## 2016-04-24 MED ORDER — RIVAROXABAN 20 MG PO TABS: 20.0000 mg | ORAL_TABLET | Freq: Every day | ORAL | Status: DC

## 2016-04-24 MED ORDER — DULOXETINE HCL 30 MG PO CPEP
30.0000 mg | ORAL_CAPSULE | Freq: Every day | ORAL | Status: DC
  Administered 2016-04-24 – 2016-05-02 (×9): 30 mg via ORAL
  Filled 2016-04-24 (×9): qty 1

## 2016-04-24 MED ORDER — SODIUM CHLORIDE 0.9 % IV SOLN
25.0000 mg | INTRAVENOUS | Status: DC | PRN
  Filled 2016-04-24: qty 0.5

## 2016-04-24 MED ORDER — NALOXONE HCL 0.4 MG/ML IJ SOLN: 0.4000 mg | INTRAMUSCULAR | Status: DC | PRN

## 2016-04-24 MED ORDER — HYDROMORPHONE HCL 2 MG/ML IJ SOLN
2.0000 mg | Freq: Once | INTRAMUSCULAR | Status: AC
  Administered 2016-04-24: 2 mg via INTRAVENOUS
  Filled 2016-04-24: qty 1

## 2016-04-24 MED ORDER — POLYETHYLENE GLYCOL 3350 17 G PO PACK: 17.0000 g | PACK | Freq: Every day | ORAL | Status: DC | PRN

## 2016-04-24 MED ORDER — DIPHENHYDRAMINE HCL 50 MG/ML IJ SOLN
25.0000 mg | Freq: Once | INTRAMUSCULAR | Status: AC
  Administered 2016-04-24: 25 mg via INTRAVENOUS
  Filled 2016-04-24: qty 1

## 2016-04-24 MED ORDER — HYDROMORPHONE BOLUS VIA INFUSION
2.0000 mg | INTRAVENOUS | Status: DC | PRN
  Administered 2016-04-24 – 2016-04-25 (×7): 2 mg via INTRAVENOUS
  Filled 2016-04-24: qty 2

## 2016-04-24 MED ORDER — RIVAROXABAN 15 MG PO TABS
15.0000 mg | ORAL_TABLET | Freq: Two times a day (BID) | ORAL | Status: DC
  Administered 2016-04-24 – 2016-05-02 (×16): 15 mg via ORAL
  Filled 2016-04-24 (×20): qty 1

## 2016-04-24 MED ORDER — SENNOSIDES-DOCUSATE SODIUM 8.6-50 MG PO TABS
1.0000 | ORAL_TABLET | Freq: Two times a day (BID) | ORAL | Status: DC
  Administered 2016-04-24 – 2016-05-02 (×17): 1 via ORAL
  Filled 2016-04-24 (×17): qty 1

## 2016-04-24 MED ORDER — SODIUM CHLORIDE 0.45 % IV SOLN
INTRAVENOUS | Status: DC
  Administered 2016-04-24: 05:00:00 via INTRAVENOUS

## 2016-04-24 MED ORDER — KETOROLAC TROMETHAMINE 30 MG/ML IJ SOLN
30.0000 mg | INTRAMUSCULAR | Status: AC
  Administered 2016-04-24: 30 mg via INTRAVENOUS
  Filled 2016-04-24: qty 1

## 2016-04-24 MED ORDER — DIPHENHYDRAMINE HCL 25 MG PO CAPS
25.0000 mg | ORAL_CAPSULE | ORAL | Status: DC | PRN
  Administered 2016-04-24 – 2016-04-27 (×4): 50 mg via ORAL
  Filled 2016-04-24 (×5): qty 2

## 2016-04-24 MED ORDER — HYDROXYUREA 500 MG PO CAPS
1000.0000 mg | ORAL_CAPSULE | Freq: Every day | ORAL | Status: DC
  Administered 2016-04-24 – 2016-05-02 (×9): 1000 mg via ORAL
  Filled 2016-04-24 (×9): qty 2

## 2016-04-24 NOTE — ED Notes (Signed)
Received pt report at the beginning of shift.  Pt with hx of sickle cell disease presenting with bilateral knees/thighs pain similar to prior sickle cell disease.  Labs are at baseline.  Pt received 3 separate dosage of pain medications (dilaudid 6mg  and toradol 30mg ) with minimal improvement of sxs.  Will consult for admission for further pain management.   7:40 AM Appreciate consultation from Dr. Jonelle Sidle who request for a UDS to be obtained and he will see pt in the ER and will determine disposition. Pt with hx of cocaine abuse.   9:09 AM Dr. Jonelle Sidle has seen and evaluated pt and will admit for further care  Results for orders placed or performed during the hospital encounter of 04/24/16  CBC with Differential  Result Value Ref Range   WBC 12.0 (H) 4.0 - 10.5 K/uL   RBC 2.96 (L) 3.87 - 5.11 MIL/uL   Hemoglobin 8.9 (L) 12.0 - 15.0 g/dL   HCT 25.7 (L) 36.0 - 46.0 %   MCV 86.8 78.0 - 100.0 fL   MCH 30.1 26.0 - 34.0 pg   MCHC 34.6 30.0 - 36.0 g/dL   RDW 19.2 (H) 11.5 - 15.5 %   Platelets 692 (H) 150 - 400 K/uL   Neutrophils Relative % 75 %   Neutro Abs 8.9 (H) 1.7 - 7.7 K/uL   Lymphocytes Relative 17 %   Lymphs Abs 2.1 0.7 - 4.0 K/uL   Monocytes Relative 8 %   Monocytes Absolute 1.0 0.1 - 1.0 K/uL   Eosinophils Relative 0 %   Eosinophils Absolute 0.0 0.0 - 0.7 K/uL   Basophils Relative 0 %   Basophils Absolute 0.0 0.0 - 0.1 K/uL  Reticulocytes  Result Value Ref Range   Retic Ct Pct 16.8 (H) 0.4 - 3.1 %   RBC. 2.96 (L) 3.87 - 5.11 MIL/uL   Retic Count, Manual 497.3 (H) 19.0 - 186.0 K/uL  Comprehensive metabolic panel  Result Value Ref Range   Sodium 139 135 - 145 mmol/L   Potassium 3.7 3.5 - 5.1 mmol/L   Chloride 107 101 - 111 mmol/L   CO2 23 22 - 32 mmol/L   Glucose, Bld 102 (H) 65 - 99 mg/dL   BUN 6 6 - 20 mg/dL   Creatinine, Ser 0.62 0.44 - 1.00 mg/dL   Calcium 9.4 8.9 - 10.3 mg/dL   Total Protein 7.6 6.5 - 8.1 g/dL   Albumin 4.7 3.5 - 5.0 g/dL   AST 18 15 - 41 U/L   ALT  22 14 - 54 U/L   Alkaline Phosphatase 51 38 - 126 U/L   Total Bilirubin 2.3 (H) 0.3 - 1.2 mg/dL   GFR calc non Af Amer >60 >60 mL/min   GFR calc Af Amer >60 >60 mL/min   Anion gap 9 5 - 15  Rapid urine drug screen (hospital performed)  Result Value Ref Range   Opiates POSITIVE (A) NONE DETECTED   Cocaine POSITIVE (A) NONE DETECTED   Benzodiazepines NONE DETECTED NONE DETECTED   Amphetamines NONE DETECTED NONE DETECTED   Tetrahydrocannabinol NONE DETECTED NONE DETECTED   Barbiturates NONE DETECTED NONE DETECTED  I-Stat Beta hCG blood, ED ([facility], [facility], [facility] only)  Result Value Ref Range   I-stat hCG, quantitative <5.0 <5 mIU/mL   Comment 3           *Note: Due to a large number of results and/or encounters for the requested time period, some results have not been displayed. A complete set of results  can be found in Results Review.   US Transvaginal Non-ob  Result Date: 04/03/2016 CLINICAL DATA:  Excessive vaginal bleeding. EXAM: TRANSABDOMINAL AND TRANSVAGINAL ULTRASOUND OF PELVIS TECHNIQUE: Both transabdominal and transvaginal ultrasound examinations of the pelvis were performed. Transabdominal technique was performed for global imaging of the pelvis including uterus, ovaries, adnexal regions, and pelvic cul-de-sac. It was necessary to proceed with endovaginal exam following the transabdominal exam to visualize the uterus, ovaries, and adnexa. COMPARISON:  CT of 02/26/2016. FINDINGS: Uterus Measurements: 7.4 x 4.1 x 5.2 cm. No fibroids or other mass visualized. Endometrium Thickness: Normal, 3 mm.  No focal abnormality visualized. Right ovary Measurements: 2.0 x 1.4 x 1.5 cm. Normal in morphology. Left ovary Measurements: 4.0 x 2.4 x 2.2 cm. An anechoic lesion within measures 2.5 cm and is likely new since the prior CT. An area of curvilinear complexity within or adjacent to this lesion measures on the order of 5 mm. IMPRESSION: 1. No explanation for excessive vaginal bleeding. 2. Left ovarian lesion  which is likely a complex dominant follicle or cyst, especially given absence of lesion in this area on 02/26/2016 CT. Given complexity within, consider follow-up with ultrasound at 6-12 weeks. Electronically Signed   By: Abigail Miyamoto M.D.   On: 04/03/2016 15:55   US Pelvis Complete  Result Date: 04/03/2016 CLINICAL DATA:  Excessive vaginal bleeding. EXAM: TRANSABDOMINAL AND TRANSVAGINAL ULTRASOUND OF PELVIS TECHNIQUE: Both transabdominal and transvaginal ultrasound examinations of the pelvis were performed. Transabdominal technique was performed for global imaging of the pelvis including uterus, ovaries, adnexal regions, and pelvic cul-de-sac. It was necessary to proceed with endovaginal exam following the transabdominal exam to visualize the uterus, ovaries, and adnexa. COMPARISON:  CT of 02/26/2016. FINDINGS: Uterus Measurements: 7.4 x 4.1 x 5.2 cm. No fibroids or other mass visualized. Endometrium Thickness: Normal, 3 mm.  No focal abnormality visualized. Right ovary Measurements: 2.0 x 1.4 x 1.5 cm. Normal in morphology. Left ovary Measurements: 4.0 x 2.4 x 2.2 cm. An anechoic lesion within measures 2.5 cm and is likely new since the prior CT. An area of curvilinear complexity within or adjacent to this lesion measures on the order of 5 mm. IMPRESSION: 1. No explanation for excessive vaginal bleeding. 2. Left ovarian lesion which is likely a complex dominant follicle or cyst, especially given absence of lesion in this area on 02/26/2016 CT. Given complexity within, consider follow-up with ultrasound at 6-12 weeks. Electronically Signed   By: Abigail Miyamoto M.D.   On: 04/03/2016 15:55   Dg Humerus Left  Result Date: 04/17/2016 CLINICAL DATA:  Diffuse left arm pain. No known injury. Sickle cell disease. EXAM: LEFT HUMERUS - 2+ VIEW COMPARISON:  Left shoulder dated 05/06/2012 and 12/11/2011 and left humerus dated 03/11/2009. FINDINGS: Patchy sclerosis is again demonstrated in the humeral head. No other  abnormalities are seen. 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 No acute abnormality. Electronically Signed   By: Claudie Revering M.D.   On: 04/17/2016 13:19      Domenic Moras, PA-C 04/24/16 [id number]

## 2016-04-24 NOTE — ED Triage Notes (Signed)
Pt presents to ED with c/o sickle cell pain crisis--- reports progressive and worsening pain to both lower legs and left arm.  Pt also reports that she was recently diagnosed with "blood clots" in the left arm.

## 2016-04-24 NOTE — Progress Notes (Signed)
[place] for Xarelto Indication: DVT  Allergies  Allergen Reactions  . Food Hives and Other (See Comments)    Pt states that she is allergic to carrots.   . Latex Rash   Patient Measurements: Height: 5\' 1"  (154.9 cm) Weight: 175 lb (79.4 kg) IBW/kg (Calculated) : 47.8  Vital Signs: Temp: 99.2 F (37.3 C) (12/10 1000) Temp Source: Oral (12/10 1000) BP: 85/55 (12/10 1000) Pulse Rate: 82 (12/10 1000)  Labs:  Recent Labs  04/24/16 0442  HGB 8.9*  HCT 25.7*  PLT 692*  CREATININE 0.62   Estimated Creatinine Clearance: 103.4 mL/min (by C-G formula based on SCr of 0.62 mg/dL).  Medical History: Past Medical History:  Diagnosis Date  . Blood transfusion    "lots"  . Blood transfusion without reported diagnosis   . Chronic back pain    "very severe; have knot in my back; from tight muscle; take RX and exercise for it"  . Depression 01/06/2011  . Exertional dyspnea    "sometimes"  . Genital HSV   . GERD (gastroesophageal reflux disease) 02/17/2011  . Migraines 11/08/11   "@ least twice/month"  . 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  . Mood swings ([place]) 11/08/11   "I go back and forth; real bad"  . Sickle cell anemia ([name])   . Sickle cell anemia with crisis ([place])   . Trichotillomania    h/o   Medications:  Scheduled:  . DULoxetine  30 mg Oral Daily  . HYDROmorphone   Intravenous Q4H  . hydroxyurea  1,000 mg Oral Daily  . ketorolac  30 mg Intravenous Q6H  . morphine  30 mg Oral Q12H  . rivaroxaban  15 mg Oral BID WC  . [START ON 05/11/2016] rivaroxaban  20 mg Oral Q supper  . senna-docusate  1 tablet Oral BID   Assessment: 16 yoF with hx sickle cell anemia, to ED 12/10 with c/o bilateral knee and thigh pain. Recent ED visit: found LUE DVT: started on Xarelto 12/5 with 15mg  bid, then 20mg  daily to begin 12/27  Goal of Therapy:  Monitor platelets by anticoagulation protocol: Yes   Plan:  Continue Xarelto  15mg  bid with meals, then 20mg  daily beginning 12/27  Bowman Higbie L 04/24/2016,11:16 AM

## 2016-04-24 NOTE — H&P (Signed)
[name] is an 25 y.o. female.   Chief Complaint: Pain in legs and back HPI: A 25 yo female with known Sickle cell disease here with pain in her left arm, back and legs. Has been having pain since she left the hospital a few days ago. She left prematurely due to family reasons. She has since been feeling worst in the last 2 days and could not control her pain at home. She rates the pain as 10/10. Has received multiple doses of Dilaudid in the ER but not getting better. No fever, no NVD. Her hemoglobin remains stable.  Past Medical History:  Diagnosis Date  . Blood transfusion    "lots"  . Blood transfusion without reported diagnosis   . Chronic back pain    "very severe; have knot in my back; from tight muscle; take RX and exercise for it"  . Depression 01/06/2011  . Exertional dyspnea    "sometimes"  . Genital HSV   . GERD (gastroesophageal reflux disease) 02/17/2011  . Migraines 11/08/11   "@ least twice/month"  . 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  . Mood swings ([place]) 11/08/11   "I go back and forth; real bad"  . Sickle cell anemia ([name])   . Sickle cell anemia with crisis ([place])   . Trichotillomania    h/o    Past Surgical History:  Procedure Laterality Date  . CHOLECYSTECTOMY  05/2010  . DILATION AND CURETTAGE OF UTERUS  02/20/11   S/P miscarriage  . IR GENERIC HISTORICAL  12/23/2015   IR FLUORO GUIDE CV LINE RIGHT 12/23/2015 Jacqulynn Cadet, MD [facility]  . IR GENERIC HISTORICAL  12/23/2015   IR US GUIDE VASC ACCESS RIGHT 12/23/2015 Jacqulynn Cadet, MD [facility]    Family History  Problem Relation Age of Onset  . Sickle cell trait Mother   . Sickle cell trait Father   . Diabetes Maternal Grandmother   . Diabetes Paternal Grandmother   . Hypertension Paternal Grandmother   . Diabetes Maternal Grandfather    Social History:  reports that she quit smoking about 3 years ago. Her smoking use included Cigarettes. She has a 0.25 pack-year smoking  history. She has never used smokeless tobacco. She reports that she uses drugs, including Other-see comments and Cocaine. She reports that she does not drink alcohol.  Allergies:  Allergies  Allergen Reactions  . Food Hives and Other (See Comments)    Pt states that she is allergic to carrots.   . Latex Rash     (Not in a hospital admission)  Results for orders placed or performed during the hospital encounter of 04/24/16 (from the past 48 hour(s))  CBC with Differential     Status: Abnormal   Collection Time: 04/24/16  4:42 AM  Result Value Ref Range   WBC 12.0 (H) 4.0 - 10.5 K/uL   RBC 2.96 (L) 3.87 - 5.11 MIL/uL   Hemoglobin 8.9 (L) 12.0 - 15.0 g/dL   HCT 25.7 (L) 36.0 - 46.0 %   MCV 86.8 78.0 - 100.0 fL   MCH 30.1 26.0 - 34.0 pg   MCHC 34.6 30.0 - 36.0 g/dL   RDW 19.2 (H) 11.5 - 15.5 %   Platelets 692 (H) 150 - 400 K/uL   Neutrophils Relative % 75 %   Neutro Abs 8.9 (H) 1.7 - 7.7 K/uL   Lymphocytes Relative 17 %   Lymphs Abs 2.1 0.7 - 4.0 K/uL   Monocytes Relative 8 %  Monocytes Absolute 1.0 0.1 - 1.0 K/uL   Eosinophils Relative 0 %   Eosinophils Absolute 0.0 0.0 - 0.7 K/uL   Basophils Relative 0 %   Basophils Absolute 0.0 0.0 - 0.1 K/uL  Reticulocytes     Status: Abnormal   Collection Time: 04/24/16  4:42 AM  Result Value Ref Range   Retic Ct Pct 16.8 (H) 0.4 - 3.1 %   RBC. 2.96 (L) 3.87 - 5.11 MIL/uL   Retic Count, Manual 497.3 (H) 19.0 - 186.0 K/uL  Comprehensive metabolic panel     Status: Abnormal   Collection Time: 04/24/16  4:42 AM  Result Value Ref Range   Sodium 139 135 - 145 mmol/L   Potassium 3.7 3.5 - 5.1 mmol/L   Chloride 107 101 - 111 mmol/L   CO2 23 22 - 32 mmol/L   Glucose, Bld 102 (H) 65 - 99 mg/dL   BUN 6 6 - 20 mg/dL   Creatinine, Ser 0.62 0.44 - 1.00 mg/dL   Calcium 9.4 8.9 - 10.3 mg/dL   Total Protein 7.6 6.5 - 8.1 g/dL   Albumin 4.7 3.5 - 5.0 g/dL   AST 18 15 - 41 U/L   ALT 22 14 - 54 U/L   Alkaline Phosphatase 51 38 - 126 U/L    Total Bilirubin 2.3 (H) 0.3 - 1.2 mg/dL   GFR calc non Af Amer >60 >60 mL/min   GFR calc Af Amer >60 >60 mL/min    Comment: (NOTE) The eGFR has been calculated using the CKD EPI equation. This calculation has not been validated in all clinical situations. eGFR's persistently <60 mL/min signify possible Chronic Kidney Disease.    Anion gap 9 5 - 15  I-Stat Beta hCG blood, ED ([facility], [facility], [facility] only)     Status: None   Collection Time: 04/24/16  4:50 AM  Result Value Ref Range   I-stat hCG, quantitative <5.0 <5 mIU/mL   Comment 3            Comment:   GEST. AGE      CONC.  (mIU/mL)   <=1 WEEK        5 - 50     2 WEEKS       50 - 500     3 WEEKS       100 - 10,000     4 WEEKS     1,000 - 30,000        FEMALE AND NON-PREGNANT FEMALE:     LESS THAN 5 mIU/mL   Rapid urine drug screen (hospital performed)     Status: Abnormal   Collection Time: 04/24/16  8:00 AM  Result Value Ref Range   Opiates POSITIVE (A) NONE DETECTED   Cocaine POSITIVE (A) NONE DETECTED   Benzodiazepines NONE DETECTED NONE DETECTED   Amphetamines NONE DETECTED NONE DETECTED   Tetrahydrocannabinol NONE DETECTED NONE DETECTED   Barbiturates NONE DETECTED NONE DETECTED    Comment:        DRUG SCREEN FOR MEDICAL PURPOSES ONLY.  IF CONFIRMATION IS NEEDED FOR ANY PURPOSE, NOTIFY LAB WITHIN 5 DAYS.        LOWEST DETECTABLE LIMITS FOR URINE DRUG SCREEN Drug Class       Cutoff (ng/mL) Amphetamine      1000 Barbiturate      200 Benzodiazepine   161 Tricyclics       096 Opiates          300 Cocaine  300 THC              50    *Note: Due to a large number of results and/or encounters for the requested time period, some results have not been displayed. A complete set of results can be found in Results Review.   No results found.  Review of Systems  Constitutional: Negative.   HENT: Negative.   Eyes: Negative.   Respiratory: Negative.   Cardiovascular: Negative.   Gastrointestinal: Negative.    Genitourinary: Negative.   Musculoskeletal: Positive for back pain and myalgias.  Skin: Negative.   Neurological: Negative.   Endo/Heme/Allergies: Negative.   Psychiatric/Behavioral: Negative.     Blood pressure 116/81, pulse 99, temperature 98.9 F (37.2 C), temperature source Oral, resp. rate 18, height 5' 1" (1.549 m), weight 79.4 kg (175 lb), last menstrual period 04/10/2016, SpO2 96 %, not currently breastfeeding. Physical Exam  Constitutional: She is oriented to person, place, and time. She appears well-developed and well-nourished.  HENT:  Head: Normocephalic and atraumatic.  Eyes: Conjunctivae are normal. Pupils are equal, round, and reactive to light.  Neck: Normal range of motion. Neck supple.  Cardiovascular: Normal rate and regular rhythm.   Respiratory: Effort normal and breath sounds normal.  GI: Soft. Bowel sounds are normal.  Musculoskeletal: Normal range of motion.  Neurological: She is alert and oriented to person, place, and time.  Skin: Skin is warm and dry.  Psychiatric: She exhibits a depressed mood.     Assessment/Plan A 25 YO female with sickle cell painful crisis  1. Sickle Cell painful crisis: Patient's symptoms may have been exacerbated by the weather. Start dilaudid PCA with toradol and IVF. Continue long acting MS Contin. Follow closely.  2. Anemia of chronic disease: Hb stable. Better than her last hemoglobin in the hospital  3. Depression: continue home medications  4. Leucocytosis: Follow WBC closely. Most likely due to [place].  Barbette Merino, MD 04/24/2016, 8:49 AM

## 2016-04-24 NOTE — ED Provider Notes (Signed)
[facility] DEPT Provider Note   CSN: [id number] Arrival date & time: 04/24/16  0357     History   Chief Complaint Chief Complaint  Patient presents with  . Sickle Cell Pain Crisis    HPI [name] is a 25 y.o. female.  HPI Patient with SCD presents with her typical sickle cell pain.  Patient reports her symptoms started yesterday with aching in her bilateral thighs and knees. She states this is typical of her sickle cell pain. She has been taking morphine and oxycodone IR without relief. She also reports persistent left upper extremity pain. Patient was discharged 3 days ago for sickle cell pain crisis and left nonocclusive DVT of the brachial vein. She has been taking Xarelto. She denies any headache, cough, chest pain, shortness of breath, fever, numbness, or weakness.  She states she thinks this crisis is related to the weather.  Past Medical History:  Diagnosis Date  . Blood transfusion    "lots"  . Blood transfusion without reported diagnosis   . Chronic back pain    "very severe; have knot in my back; from tight muscle; take RX and exercise for it"  . Depression 01/06/2011  . Exertional dyspnea    "sometimes"  . Genital HSV   . GERD (gastroesophageal reflux disease) 02/17/2011  . Migraines 11/08/11   "@ least twice/month"  . 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  . Mood swings ([place]) 11/08/11   "I go back and forth; real bad"  . Sickle cell anemia ([name])   . Sickle cell anemia with crisis ([place])   . Trichotillomania    h/o    Patient Active Problem List   Diagnosis Date Noted  . Acute deep vein thrombosis (DVT) of left upper extremity ([place]) 04/17/2016  . Post partum depression 04/03/2016  . Syncope   . Episode of recurrent major depressive disorder ([place])   . Fall during current hospitalization   . 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 Sickle cell anemia with pain ([place]) 02/16/2016  . Chronic back pain   . Sickle cell anemia with  crisis ([place]) 01/05/2016  . Nausea & vomiting 12/27/2015  . Sickle cell crisis ([place]) 11/09/2015  . Sepsis ([place]) 11/09/2015  . Thrombocytosis ([place]) 11/09/2015  . Vaginal bleeding 11/09/2015  . Hyperbilirubinemia 11/09/2015  . Pyrexia   . Vaginal delivery 10/31/2015  . Sickle cell pain crisis ([place]) 10/26/2015  . Positive GBS test 10/18/2015  . Narcotic dependence ([place]) 10/06/2015  . 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 Nausea and vomiting 09/07/2015  . High risk for intrapartum complications, antepartum 08/12/2015  . Chronic pain 08/04/2015  . Cocaine use 07/24/2015  . Maternal sickle cell anemia affecting pregnancy, antepartum ([place]) 07/14/2015  . Anemia of chronic disease   . Hypokalemia 10/29/2014  . Hb-SS disease without crisis ([place]) 02/26/2013  . Migraines 11/08/2011  . GERD (gastroesophageal reflux disease) 02/17/2011  . 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 Sickle cell disease ([place]) 01/08/2009  . 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 CHOLECYSTECTOMY  05/2010  . DILATION AND CURETTAGE OF UTERUS  02/20/11   S/P miscarriage  . IR GENERIC HISTORICAL  12/23/2015   IR FLUORO GUIDE CV LINE RIGHT 12/23/2015 Jacqulynn Cadet, MD [facility]-INTERV RAD  . IR GENERIC HISTORICAL  12/23/2015   IR US GUIDE VASC ACCESS RIGHT 12/23/2015 Jacqulynn Cadet, MD [facility]INTER[facility]  OB History    Gravida Para Term Preterm AB Living   3 2 2   1 2    SAB TAB Ectopic Multiple Live Births   1     0 2      Obstetric Comments   Miscarried in October 2012 at about 7 weeks       Home Medications    Prior to Admission medications   Medication Sig Start Date End Date Taking? Authorizing Provider  aspirin-acetaminophen-caffeine (EXCEDRIN MIGRAINE) [phone] MG tablet Take 2 tablets by mouth every 6 (six) hours as needed for headache.     Historical Provider, MD    cyclobenzaprine (FLEXERIL) 10 MG tablet Take 1 tablet (10 mg total) by mouth 3 (three) times daily as needed for muscle spasms. 02/09/16   Tresa Garter, MD  DULoxetine (CYMBALTA) 30 MG capsule Take 1 capsule (30 mg total) by mouth daily. Patient not taking: Reported on 04/17/2016 03/04/16   [name], MD  hydroxyurea (HYDREA) 500 MG capsule Take 2 capsules (1,000 mg total) by mouth daily. May take with food to minimize GI side effects. 04/18/16   Tresa Garter, MD  ibuprofen (ADVIL,MOTRIN) 600 MG tablet Take 1 tablet (600 mg total) by mouth every 6 (six) hours as needed for mild pain or moderate pain. 03/14/16   Tresa Garter, MD  morphine (MS CONTIN) 30 MG 12 hr tablet Take 1 tablet (30 mg total) by mouth every 12 (twelve) hours. 04/06/16   Tresa Garter, MD  oxyCODONE (ROXICODONE) 15 MG immediate release tablet Take 1 tablet (15 mg total) by mouth every 4 (four) hours as needed for pain. 04/18/16 05/03/16  Tresa Garter, MD  Rivaroxaban (XARELTO STARTER PACK) 15 & 20 MG TBPK Take as directed on package: Start with one 15mg  tablet by mouth twice a day with food. On Day 22, switch to one 20mg  tablet once a day with food. 04/20/16   [name], MD    Family History Family History  Problem Relation Age of Onset  . Sickle cell trait Mother   . Sickle cell trait Father   . Diabetes Maternal Grandmother   . Diabetes Paternal Grandmother   . Hypertension Paternal Grandmother   . Diabetes Maternal Grandfather     Social History Social History  Substance Use Topics  . Smoking status: Former Smoker    Packs/day: 0.25    Years: 1.00    Types: Cigarettes    Quit date: 03/25/2013  . Smokeless tobacco: Never Used  . Alcohol use No     Comment: pt states she quit marijuan in May 2013. Rare ETOH, + cigarettes.  She is enrolled in school     Allergies   Food and Latex   Review of Systems Review of Systems All other systems negative unless  otherwise stated in HPI   Physical Exam Updated Vital Signs BP 111/88   Pulse 98   Temp 98.9 F (37.2 C) (Oral)   Resp 18   Ht 5\' 1"  (1.549 m)   Wt 79.4 kg   LMP 04/10/2016   SpO2 99%   BMI 33.07 kg/m   Physical Exam  Constitutional: She is oriented to person, place, and time. She appears well-developed and well-nourished.  Non-toxic appearance. She does not have a sickly appearance. She does not appear ill.  HENT:  Head: Normocephalic and atraumatic.  Mouth/Throat: Oropharynx is clear and moist.  Eyes: Conjunctivae are normal.  Neck: Normal range of motion. Neck supple.  Cardiovascular: Normal rate and regular rhythm.   Pulmonary/Chest: Effort normal and breath sounds normal. No accessory muscle usage or stridor. No respiratory distress. She has no wheezes. She has no rhonchi. She has no rales.  Abdominal: Soft. Bowel sounds are normal. She exhibits no distension. There is no tenderness.  Musculoskeletal: Normal range of motion. She exhibits tenderness.  Mild tenderness over left distal bicep without erythema or warmth.  Diffuse tenderness of lower extremities, specifically bilateral thighs and knees.  No swelling, warmth, erythema, or bruising.   Lymphadenopathy:    She has no cervical adenopathy.  Neurological: She is alert and oriented to person, place, and time.  Speech clear without dysarthria.  Skin: Skin is warm and dry.  Psychiatric: She has a normal mood and affect. Her behavior is normal.     ED Treatments / Results  Labs (all labs ordered are listed, but only abnormal results are displayed) Labs Reviewed  CBC WITH DIFFERENTIAL/PLATELET - Abnormal; Notable for the following:       Result Value   WBC 12.0 (*)    RBC 2.96 (*)    Hemoglobin 8.9 (*)    HCT 25.7 (*)    RDW 19.2 (*)    Platelets 692 (*)    Neutro Abs 8.9 (*)    All other components within normal limits  RETICULOCYTES - Abnormal; Notable for the following:    Retic Ct Pct 16.8 (*)    RBC.  2.96 (*)    Retic Count, Manual 497.3 (*)    All other components within normal limits  COMPREHENSIVE METABOLIC PANEL - Abnormal; Notable for the following:    Glucose, Bld 102 (*)    Total Bilirubin 2.3 (*)    All other components within normal limits  I-STAT BETA HCG BLOOD, ED ([facility], [facility], [facility] ONLY)    EKG  EKG Interpretation None       Radiology No results found.  Procedures Procedures (including critical care time)  Medications Ordered in ED Medications  0.45 % sodium chloride infusion ( Intravenous New Bag/Given 04/24/16 0444)  ondansetron (ZOFRAN) injection 4 mg (4 mg Intravenous Given 04/24/16 0444)  ketorolac (TORADOL) 30 MG/ML injection 30 mg (30 mg Intravenous Given 04/24/16 0444)  diphenhydrAMINE (BENADRYL) injection 25 mg (25 mg Intravenous Given 04/24/16 0445)  HYDROmorphone (DILAUDID) injection 2 mg (2 mg Intravenous Given 04/24/16 0445)  HYDROmorphone (DILAUDID) injection 2 mg (2 mg Intravenous Given 04/24/16 0515)  HYDROmorphone (DILAUDID) injection 2 mg (2 mg Intravenous Given 04/24/16 0559)     Initial Impression / Assessment and Plan / ED Course  I have reviewed the triage vital signs and the nursing notes.  Pertinent labs & imaging results that were available during my care of the patient were reviewed by me and considered in my medical decision making (see chart for details).  Clinical Course    Patient presents with her typical sickle cell pain crisis uncontrolled on home meds as well as persistent LUE pain secondary to non-occlusive DVT.  She has been compliant with Xarelto and denies any fever, cough, CP, or SOB.  Vitals reassuring. Patient has received 2 dose 2mg  Dilaudid and Toradol with continued pain.  Third dose just given at 6 AM, plan to reassess.  If continued pain, she will need admission for IV pain control.  Patient care hand off to PA Bowie at shift change who will follow up on 3rd dose and disposition.  Final Clinical Impressions(s) / ED  Diagnoses   Final diagnoses:  Sickle  cell pain crisis [facility])    New Prescriptions New Prescriptions   No medications on file     Gloriann Loan, PA-C 04/24/16 0604    Ripley Fraise, MD 04/24/16 [phone]

## 2016-04-24 NOTE — ED Notes (Signed)
Patient provided with bedside commode. Patient currently attempting to give urine sample. Call light within reach.

## 2016-04-25 DIAGNOSIS — D5700 Hb-SS disease with crisis, unspecified: Secondary | ICD-10-CM | POA: Diagnosis not present

## 2016-04-25 DIAGNOSIS — G8929 Other chronic pain: Secondary | ICD-10-CM

## 2016-04-25 DIAGNOSIS — F329 Major depressive disorder, single episode, unspecified: Secondary | ICD-10-CM

## 2016-04-25 DIAGNOSIS — K5909 Other constipation: Secondary | ICD-10-CM | POA: Diagnosis not present

## 2016-04-25 DIAGNOSIS — D638 Anemia in other chronic diseases classified elsewhere: Secondary | ICD-10-CM | POA: Diagnosis not present

## 2016-04-25 DIAGNOSIS — I82622 Acute embolism and thrombosis of deep veins of left upper extremity: Secondary | ICD-10-CM | POA: Diagnosis not present

## 2016-04-25 LAB — CBC WITH DIFFERENTIAL/PLATELET
Basophils Absolute: 0 10*3/uL (ref 0.0–0.1)
Basophils Relative: 0 %
EOS PCT: 9 %
Eosinophils Absolute: 1.3 10*3/uL — ABNORMAL HIGH (ref 0.0–0.7)
HEMATOCRIT: 20 % — AB (ref 36.0–46.0)
Hemoglobin: 6.9 g/dL — CL (ref 12.0–15.0)
Lymphocytes Relative: 21 %
Lymphs Abs: 3 10*3/uL (ref 0.7–4.0)
MCH: 30 pg (ref 26.0–34.0)
MCHC: 34.5 g/dL (ref 30.0–36.0)
MCV: 87 fL (ref 78.0–100.0)
MONOS PCT: 12 %
Monocytes Absolute: 1.7 10*3/uL — ABNORMAL HIGH (ref 0.1–1.0)
NEUTROS PCT: 58 %
Neutro Abs: 8.2 10*3/uL — ABNORMAL HIGH (ref 1.7–7.7)
PLATELETS: 562 10*3/uL — AB (ref 150–400)
RBC: 2.3 MIL/uL — AB (ref 3.87–5.11)
RDW: 19.6 % — ABNORMAL HIGH (ref 11.5–15.5)
WBC: 14.2 10*3/uL — ABNORMAL HIGH (ref 4.0–10.5)

## 2016-04-25 LAB — COMPREHENSIVE METABOLIC PANEL
ALT: 21 U/L (ref 14–54)
AST: 22 U/L (ref 15–41)
Albumin: 4.1 g/dL (ref 3.5–5.0)
Alkaline Phosphatase: 57 U/L (ref 38–126)
Anion gap: 4 — ABNORMAL LOW (ref 5–15)
BUN: 13 mg/dL (ref 6–20)
CHLORIDE: 106 mmol/L (ref 101–111)
CO2: 28 mmol/L (ref 22–32)
Calcium: 8.5 mg/dL — ABNORMAL LOW (ref 8.9–10.3)
Creatinine, Ser: 0.88 mg/dL (ref 0.44–1.00)
Glucose, Bld: 104 mg/dL — ABNORMAL HIGH (ref 65–99)
POTASSIUM: 4.1 mmol/L (ref 3.5–5.1)
Sodium: 138 mmol/L (ref 135–145)
Total Bilirubin: 3 mg/dL — ABNORMAL HIGH (ref 0.3–1.2)
Total Protein: 6.8 g/dL (ref 6.5–8.1)

## 2016-04-25 LAB — PREPARE RBC (CROSSMATCH)

## 2016-04-25 MED ORDER — SODIUM CHLORIDE 0.9% FLUSH: 10.0000 mL | INTRAVENOUS | Status: DC | PRN

## 2016-04-25 MED ORDER — HYDROMORPHONE HCL 1 MG/ML IJ SOLN
1.0000 mg | INTRAMUSCULAR | Status: DC | PRN
  Administered 2016-04-25 – 2016-04-26 (×3): 1 mg via INTRAVENOUS
  Filled 2016-04-25 (×2): qty 1

## 2016-04-25 MED ORDER — SODIUM CHLORIDE 0.9 % IV SOLN
Freq: Once | INTRAVENOUS | Status: AC
  Administered 2016-04-26: 03:00:00 via INTRAVENOUS

## 2016-04-25 MED ORDER — OXYCODONE HCL 5 MG PO TABS
20.0000 mg | ORAL_TABLET | ORAL | Status: DC
  Administered 2016-04-25 – 2016-05-02 (×40): 20 mg via ORAL
  Filled 2016-04-25 (×42): qty 4

## 2016-04-25 NOTE — Significant Event (Signed)
CRITICAL VALUE ALERT Critical value received:  Hemoglobin 6.9  Date of notification:  04/25/2016  Time of notification:  2132  Critical value read back:yes  Nurse who received alert:  Dorothyann Peng Montel Vanderhoof,rn  MD notified (1st page):  Fransisca Connors  Time of first page:  2135  MD notified (2nd page):  Time of second page:  Responding MD:  Fransisca Connors  Time MD responded:  2140

## 2016-04-25 NOTE — Progress Notes (Signed)
SICKLE CELL SERVICE PROGRESS NOTE  [name] [id number] DOB: [birth year] DOA: 04/24/2016 PCP: Angelica Chessman, MD  Assessment/Plan: Principal Problem:   Hb-SS disease with crisis ([place]) Active Problems:   Major depression, chronic   Chronic pain   Sickle cell anemia with crisis ([place])  1. Hb SS with crisis: Will schedule Oxycodone in addition to Dilaudid PCA and clinician assisted doses on a PRN basis. Pt is also continued on Toradol.  2. DVT LUE: Pt  Recently started on anticoagulation she is currently on 15 mg BID until 05/10/2016 then she should change to 20 mg daily.  3. Leukocytosis: Pt has a mild leukocytosis which is likely due to her crisis. She  has no current evidence of infection. 4. Depression: Pt was prescribed Cymbalta on a previous admission after being evaluated by Psychiatry. She reports that she has not been taking the medication as she did not know that she was supposed to be taking it.  5. Anemia of Chronic Disease: Hb stable.  6. Chronic pain Continue MS Contin.  7. Medication non-compliance: It appears that patient does not have a good understanding of her medications although she appears to talk a good game of understanding. May need a nurse to help with medication management.   Code Status: Full Code Family Communication: N/A Disposition Plan: Not yet ready for discharge  [place].  Pager [phone]. If 7PM-7AM, please contact night-coverage.  04/25/2016, 10:27 AM  LOS: 1 day   Admitting History: [name] is an opiate tolerant patient with Hb SS, major depression, post-partum depression, chronic opiate use and insufficient coping with 2 children under the age of 66 y/o. She was hospitalized last week for acute DVT and sickle cell crisis. Although she reported that her pain she admits that she requested discharge pre-maturely due to her not having anyone to keep her children while she was hospitalized. She returned yesterday with c/o pain and  crisis. Today she rates her pain at 8/10 and localized to BLE's and LUE.   Consultants:  None  Procedures:  None  Antibiotics:  None   Objective: Vitals:   04/25/16 0036 04/25/16 0216 04/25/16 0419 04/25/16 0759  BP:  (!) 129/91    Pulse:  (!) 108    Resp: 16 16 14 14   Temp:  98.9 F (37.2 C)    TempSrc:  Oral    SpO2: 94% 99% 95% 92%  Weight:      Height:       Weight change:   Intake/Output Summary (Last 24 hours) at 04/25/16 1027 Last data filed at 04/25/16 0454  Gross per 24 hour  Intake          2855.83 ml  Output                0 ml  Net          2855.83 ml    General: Alert, awake, oriented x3, in moderate distress.  HEENT: [place]/AT PEERL, EOMI, anicteric Neck: Trachea midline,  no masses, no thyromegal,y no JVD, no carotid bruit OROPHARYNX:  Moist, No exudate/ erythema/lesions.  Heart: Regular rate and rhythm, without murmurs, rubs, gallops, PMI non-displaced, no heaves or thrills on palpation.  Lungs: Clear to auscultation, no wheezing or rhonchi noted. No increased vocal fremitus resonant to percussion. Abdomen: Soft, nontender, nondistended, positive bowel sounds, no masses no hepatosplenomegaly noted.  Neuro: No focal neurological deficits noted cranial nerves II through XII grossly intact.  Strength at baseline in bilateral upper and lower extremities.  Musculoskeletal: No warmth swelling or erythema around joints, no spinal tenderness noted. Psychiatric: Patient alert and oriented x3, good insight and cognition, good recent to remote recall.    Data Reviewed: Basic Metabolic Panel:  Recent Labs Lab 04/24/16 0442  NA 139  K 3.7  CL 107  CO2 23  GLUCOSE 102*  BUN 6  CREATININE 0.62  CALCIUM 9.4   Liver Function Tests:  Recent Labs Lab 04/24/16 0442  AST 18  ALT 22  ALKPHOS 51  BILITOT 2.3*  PROT 7.6  ALBUMIN 4.7   No results for input(s): LIPASE, AMYLASE in the last 168 hours. No results for input(s): AMMONIA in the last 168  hours. CBC:  Recent Labs Lab 04/24/16 0442  WBC 12.0*  NEUTROABS 8.9*  HGB 8.9*  HCT 25.7*  MCV 86.8  PLT 692*   Cardiac Enzymes: No results for input(s): CKTOTAL, CKMB, CKMBINDEX, TROPONINI in the last 168 hours. BNP (last 3 results) No results for input(s): BNP in the last 8760 hours.  ProBNP (last 3 results) No results for input(s): PROBNP in the last 8760 hours.  CBG: No results for input(s): GLUCAP in the last 168 hours.  No results found for this or any previous visit (from the past 240 hour(s)).   Studies: US Transvaginal Non-ob  Result Date: 04/03/2016 CLINICAL DATA:  Excessive vaginal bleeding. EXAM: TRANSABDOMINAL AND TRANSVAGINAL ULTRASOUND OF PELVIS TECHNIQUE: Both transabdominal and transvaginal ultrasound examinations of the pelvis were performed. Transabdominal technique was performed for global imaging of the pelvis including uterus, ovaries, adnexal regions, and pelvic cul-de-sac. It was necessary to proceed with endovaginal exam following the transabdominal exam to visualize the uterus, ovaries, and adnexa. COMPARISON:  CT of 02/26/2016. FINDINGS: Uterus Measurements: 7.4 x 4.1 x 5.2 cm. No fibroids or other mass visualized. Endometrium Thickness: Normal, 3 mm.  No focal abnormality visualized. Right ovary Measurements: 2.0 x 1.4 x 1.5 cm. Normal in morphology. Left ovary Measurements: 4.0 x 2.4 x 2.2 cm. An anechoic lesion within measures 2.5 cm and is likely new since the prior CT. An area of curvilinear complexity within or adjacent to this lesion measures on the order of 5 mm. IMPRESSION: 1. No explanation for excessive vaginal bleeding. 2. Left ovarian lesion which is likely a complex dominant follicle or cyst, especially given absence of lesion in this area on 02/26/2016 CT. Given complexity within, consider follow-up with ultrasound at 6-12 weeks. Electronically Signed   By: Abigail Miyamoto M.D.   On: 04/03/2016 15:55   US Pelvis Complete  Result Date:  04/03/2016 CLINICAL DATA:  Excessive vaginal bleeding. EXAM: TRANSABDOMINAL AND TRANSVAGINAL ULTRASOUND OF PELVIS TECHNIQUE: Both transabdominal and transvaginal ultrasound examinations of the pelvis were performed. Transabdominal technique was performed for global imaging of the pelvis including uterus, ovaries, adnexal regions, and pelvic cul-de-sac. It was necessary to proceed with endovaginal exam following the transabdominal exam to visualize the uterus, ovaries, and adnexa. COMPARISON:  CT of 02/26/2016. FINDINGS: Uterus Measurements: 7.4 x 4.1 x 5.2 cm. No fibroids or other mass visualized. Endometrium Thickness: Normal, 3 mm.  No focal abnormality visualized. Right ovary Measurements: 2.0 x 1.4 x 1.5 cm. Normal in morphology. Left ovary Measurements: 4.0 x 2.4 x 2.2 cm. An anechoic lesion within measures 2.5 cm and is likely new since the prior CT. An area of curvilinear complexity within or adjacent to this lesion measures on the order of 5 mm. IMPRESSION: 1. No explanation for excessive vaginal bleeding. 2. Left ovarian lesion which is likely a  complex dominant follicle or cyst, especially given absence of lesion in this area on 02/26/2016 CT. Given complexity within, consider follow-up with ultrasound at 6-12 weeks. Electronically Signed   By: Abigail Miyamoto M.D.   On: 04/03/2016 15:55   Dg Humerus Left  Result Date: 04/17/2016 CLINICAL DATA:  Diffuse left arm pain. No known injury. Sickle cell disease. EXAM: LEFT HUMERUS - 2+ VIEW COMPARISON:  Left shoulder dated 05/06/2012 and 12/11/2011 and left humerus dated 03/11/2009. FINDINGS: Patchy sclerosis is again demonstrated in the humeral head. No other abnormalities are seen. 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 No acute abnormality. Electronically Signed   By: Claudie Revering M.D.   On: 04/17/2016 13:19    Scheduled Meds: . DULoxetine  30 mg Oral Daily  . HYDROmorphone   Intravenous Q4H  . hydroxyurea  1,000 mg Oral  Daily  . ketorolac  30 mg Intravenous Q6H  . morphine  30 mg Oral Q12H  . rivaroxaban  15 mg Oral BID WC  . [START ON 05/11/2016] rivaroxaban  20 mg Oral Q supper  . senna-docusate  1 tablet Oral BID   Continuous Infusions: . sodium chloride Stopped (04/24/16 1230)  . dextrose 5 % and 0.45% NaCl 125 mL/hr at 04/25/16 1026    Principal Problem:   Hb-SS disease with crisis [facility]) Active Problems:   Major depression, chronic   Chronic pain   Sickle cell anemia with crisis ([place])      In excess of 25 minutes. Greater than 50% involved face to face contact with the patient for assessment, counseling and coordination of care.

## 2016-04-25 NOTE — Progress Notes (Signed)
Patient oriented to room and PCA and IVFs initiated.  Patient immediately stated "I won't mess with my IV pump cause I know I've done that in the past".  Patient educated that manipulating her pump puts her at risk and only nursing should manipulate the equipment.  Patient also educated that if she does manipulate the pump her IV would be turned off and the MD notified.  Patient verbalized understanding.

## 2016-04-26 ENCOUNTER — Ambulatory Visit: Payer: Medicaid Other | Admitting: Internal Medicine

## 2016-04-26 DIAGNOSIS — D638 Anemia in other chronic diseases classified elsewhere: Secondary | ICD-10-CM | POA: Diagnosis not present

## 2016-04-26 DIAGNOSIS — F329 Major depressive disorder, single episode, unspecified: Secondary | ICD-10-CM | POA: Diagnosis not present

## 2016-04-26 DIAGNOSIS — G8929 Other chronic pain: Secondary | ICD-10-CM | POA: Diagnosis not present

## 2016-04-26 DIAGNOSIS — D5700 Hb-SS disease with crisis, unspecified: Secondary | ICD-10-CM | POA: Diagnosis not present

## 2016-04-26 MED ORDER — HYDROMORPHONE 1 MG/ML IV SOLN
INTRAVENOUS | Status: DC
  Administered 2016-04-26: 14.8 mg via INTRAVENOUS
  Administered 2016-04-26: 2 mg via INTRAVENOUS
  Administered 2016-04-26: 9.6 mg via INTRAVENOUS
  Administered 2016-04-26 (×3): 2 mg via INTRAVENOUS
  Administered 2016-04-26: 25 mg via INTRAVENOUS
  Administered 2016-04-27: 13.8 mg via INTRAVENOUS
  Administered 2016-04-27: 2 mg via INTRAVENOUS
  Administered 2016-04-27: 7.4 mg via INTRAVENOUS
  Administered 2016-04-27 (×2): 2 mg via INTRAVENOUS
  Administered 2016-04-27: 5.2 mg via INTRAVENOUS
  Administered 2016-04-27: 2.6 mg via INTRAVENOUS
  Administered 2016-04-27: 11.2 mg via INTRAVENOUS
  Administered 2016-04-27 (×5): 2 mg via INTRAVENOUS
  Administered 2016-04-27: 14:00:00 via INTRAVENOUS
  Administered 2016-04-28: 6.2 mg via INTRAVENOUS
  Administered 2016-04-28 (×2): 2 mg via INTRAVENOUS
  Administered 2016-04-28: 5.6 mg via INTRAVENOUS
  Administered 2016-04-28 (×4): 2 mg via INTRAVENOUS
  Administered 2016-04-28: 0.6 mg via INTRAVENOUS
  Administered 2016-04-28: 21:00:00 via INTRAVENOUS
  Administered 2016-04-28: 8.6 mg via INTRAVENOUS
  Administered 2016-04-28: 7.6 mg via INTRAVENOUS
  Administered 2016-04-28: 6.4 mg via INTRAVENOUS
  Administered 2016-04-28: 06:00:00 via INTRAVENOUS
  Administered 2016-04-28 – 2016-04-29 (×2): 2 mg via INTRAVENOUS
  Administered 2016-04-29: 9.3 mg via INTRAVENOUS
  Administered 2016-04-29: 6.2 mg via INTRAVENOUS
  Administered 2016-04-29 (×3): 2 mg via INTRAVENOUS
  Administered 2016-04-29: 8 mg via INTRAVENOUS
  Administered 2016-04-29: 4.4 mg via INTRAVENOUS
  Administered 2016-04-29: 11:00:00 via INTRAVENOUS
  Filled 2016-04-26 (×5): qty 25

## 2016-04-26 NOTE — Progress Notes (Signed)
SICKLE CELL SERVICE PROGRESS NOTE  [name] [id number] DOB: 01/31/1991 DOA: 04/24/2016 PCP: Angelica Chessman, MD  Assessment/Plan: Principal Problem:   Hb-SS disease with crisis ([place]) Active Problems:   Major depression, chronic   Chronic pain   Sickle cell anemia with crisis ([place])  1. Hb SS with crisis: Will schedule Oxycodone in addition to Dilaudid PCA and clinician assisted doses on a PRN basis. Pt is also continued on Toradol.  2. DVT LUE: Pt  Recently started on anticoagulation she is currently on 15 mg BID until 05/10/2016 then she should change to 20 mg daily.  3. Leukocytosis: Pt has a mild leukocytosis which is likely due to her crisis. She  has no current evidence of infection. 4. Depression: Pt was prescribed Cymbalta on a previous admission after being evaluated by Psychiatry. She reports that she has not been taking the medication as she did not know that she was supposed to be taking it.  5. Anemia of Chronic Disease: Pt was ordered and received a bold transfusion overnight for reasons that are unclear as her Hb was 6.9 and patient was asymptomatic.   6. Chronic pain Continue MS Contin.  7. Medication non-compliance: It appears that patient does not have a good understanding of her medications although she appears to talk a good game of understanding. May need a nurse to help with medication management.   Code Status: Full Code Family Communication: N/A Disposition Plan: Not yet ready for discharge  [place].  Pager [phone]. If 7PM-7AM, please contact night-coverage.  04/26/2016, 11:06 AM  LOS: 2 days   Interim History: Pt reports that her pain in 8/10 in right knee and thigh, and  5/10 in left knee and thigh. Pin is also localized to left arm at intensity of 7/10. Pt has used  41.8 mg with 44/34:demands/deliveries Pt is using heating pad and has been advised to use K-pad  over her garments so as to prevent burn to the skin. She acknowledges  understanding but prefers to use the K-pad directly to skin despite risk. She had a BM this morning.  Pt states that she only has a BM about 3 x week on a regular basis.  Consultants:  None  Procedures:  None  Antibiotics:  None   Objective: Vitals:   04/26/16 0400 04/26/16 0422 04/26/16 0524 04/26/16 0812  BP:  119/74 118/64   Pulse:  89 98   Resp: 14 17 16 14   Temp:  98.5 F (36.9 C) 98.5 F (36.9 C)   TempSrc:  Oral Oral   SpO2: 97% 95% 99% 93%  Weight:      Height:       Weight change:   Intake/Output Summary (Last 24 hours) at 04/26/16 1106 Last data filed at 04/26/16 0415  Gross per 24 hour  Intake          1953.33 ml  Output                0 ml  Net          1953.33 ml    General: Alert, awake, oriented x3, in moderate distress.  HEENT: [place]/AT PEERL, EOMI, anicteric Neck: Trachea midline,  no masses, no thyromegal,y no JVD, no carotid bruit OROPHARYNX:  Moist, No exudate/ erythema/lesions.  Heart: Regular rate and rhythm, without murmurs, rubs, gallops, PMI non-displaced, no heaves or thrills on palpation.  Lungs: Clear to auscultation, no wheezing or rhonchi noted. No increased vocal fremitus resonant to percussion. Abdomen: Soft,  nontender, nondistended, positive bowel sounds, no masses no hepatosplenomegaly noted.  Neuro: No focal neurological deficits noted cranial nerves II through XII grossly intact.  Strength at baseline in bilateral upper and lower extremities. Musculoskeletal: No warmth swelling or erythema around joints, no spinal tenderness noted. Psychiatric: Patient alert and oriented x3, good insight and cognition, good recent to remote recall.    Data Reviewed: Basic Metabolic Panel:  Recent Labs Lab 04/24/16 0442 04/25/16 2042  NA 139 138  K 3.7 4.1  CL 107 106  CO2 23 28  GLUCOSE 102* 104*  BUN 6 13  CREATININE 0.62 0.88  CALCIUM 9.4 8.5*   Liver Function Tests:  Recent Labs Lab 04/24/16 0442 04/25/16 2042  AST 18 22   ALT 22 21  ALKPHOS 51 57  BILITOT 2.3* 3.0*  PROT 7.6 6.8  ALBUMIN 4.7 4.1   No results for input(s): LIPASE, AMYLASE in the last 168 hours. No results for input(s): AMMONIA in the last 168 hours. CBC:  Recent Labs Lab 04/24/16 0442 04/25/16 2042  WBC 12.0* 14.2*  NEUTROABS 8.9* 8.2*  HGB 8.9* 6.9*  HCT 25.7* 20.0*  MCV 86.8 87.0  PLT 692* 562*   Cardiac Enzymes: No results for input(s): CKTOTAL, CKMB, CKMBINDEX, TROPONINI in the last 168 hours. BNP (last 3 results) No results for input(s): BNP in the last 8760 hours.  ProBNP (last 3 results) No results for input(s): PROBNP in the last 8760 hours.  CBG: No results for input(s): GLUCAP in the last 168 hours.  No results found for this or any previous visit (from the past 240 hour(s)).   Studies: US Transvaginal Non-ob  Result Date: 04/03/2016 CLINICAL DATA:  Excessive vaginal bleeding. EXAM: TRANSABDOMINAL AND TRANSVAGINAL ULTRASOUND OF PELVIS TECHNIQUE: Both transabdominal and transvaginal ultrasound examinations of the pelvis were performed. Transabdominal technique was performed for global imaging of the pelvis including uterus, ovaries, adnexal regions, and pelvic cul-de-sac. It was necessary to proceed with endovaginal exam following the transabdominal exam to visualize the uterus, ovaries, and adnexa. COMPARISON:  CT of 02/26/2016. FINDINGS: Uterus Measurements: 7.4 x 4.1 x 5.2 cm. No fibroids or other mass visualized. Endometrium Thickness: Normal, 3 mm.  No focal abnormality visualized. Right ovary Measurements: 2.0 x 1.4 x 1.5 cm. Normal in morphology. Left ovary Measurements: 4.0 x 2.4 x 2.2 cm. An anechoic lesion within measures 2.5 cm and is likely new since the prior CT. An area of curvilinear complexity within or adjacent to this lesion measures on the order of 5 mm. IMPRESSION: 1. No explanation for excessive vaginal bleeding. 2. Left ovarian lesion which is likely a complex dominant follicle or cyst, especially  given absence of lesion in this area on 02/26/2016 CT. Given complexity within, consider follow-up with ultrasound at 6-12 weeks. Electronically Signed   By: Abigail Miyamoto M.D.   On: 04/03/2016 15:55   US Pelvis Complete  Result Date: 04/03/2016 CLINICAL DATA:  Excessive vaginal bleeding. EXAM: TRANSABDOMINAL AND TRANSVAGINAL ULTRASOUND OF PELVIS TECHNIQUE: Both transabdominal and transvaginal ultrasound examinations of the pelvis were performed. Transabdominal technique was performed for global imaging of the pelvis including uterus, ovaries, adnexal regions, and pelvic cul-de-sac. It was necessary to proceed with endovaginal exam following the transabdominal exam to visualize the uterus, ovaries, and adnexa. COMPARISON:  CT of 02/26/2016. FINDINGS: Uterus Measurements: 7.4 x 4.1 x 5.2 cm. No fibroids or other mass visualized. Endometrium Thickness: Normal, 3 mm.  No focal abnormality visualized. Right ovary Measurements: 2.0 x 1.4 x 1.5 cm. Normal  in morphology. Left ovary Measurements: 4.0 x 2.4 x 2.2 cm. An anechoic lesion within measures 2.5 cm and is likely new since the prior CT. An area of curvilinear complexity within or adjacent to this lesion measures on the order of 5 mm. IMPRESSION: 1. No explanation for excessive vaginal bleeding. 2. Left ovarian lesion which is likely a complex dominant follicle or cyst, especially given absence of lesion in this area on 02/26/2016 CT. Given complexity within, consider follow-up with ultrasound at 6-12 weeks. Electronically Signed   By: Abigail Miyamoto M.D.   On: 04/03/2016 15:55   Dg Humerus Left  Result Date: 04/17/2016 CLINICAL DATA:  Diffuse left arm pain. No known injury. Sickle cell disease. EXAM: LEFT HUMERUS - 2+ VIEW COMPARISON:  Left shoulder dated 05/06/2012 and 12/11/2011 and left humerus dated 03/11/2009. FINDINGS: Patchy sclerosis is again demonstrated in the humeral head. No other abnormalities are seen. 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 No acute abnormality. Electronically Signed   By: Claudie Revering M.D.   On: 04/17/2016 13:19    Scheduled Meds: . DULoxetine  30 mg Oral Daily  . HYDROmorphone   Intravenous Q4H  . hydroxyurea  1,000 mg Oral Daily  . ketorolac  30 mg Intravenous Q6H  . morphine  30 mg Oral Q12H  . oxyCODONE  20 mg Oral Q4H  . rivaroxaban  15 mg Oral BID WC  . [START ON 05/11/2016] rivaroxaban  20 mg Oral Q supper  . senna-docusate  1 tablet Oral BID   Continuous Infusions: . dextrose 5 % and 0.45% NaCl 1,000 mL (04/25/16 2026)    Principal Problem:   Hb-SS disease with crisis ([place]) Active Problems:   Major depression, chronic   Chronic pain   Sickle cell anemia with crisis ([place])      In excess of 25 minutes. Greater than 50% involved face to face contact with the patient for assessment, counseling and coordination of care.

## 2016-04-26 NOTE — Care Management Note (Signed)
Case Management Note  Patient Details  Name: [name] MRN: [id number] Date of Birth: 1990/10/07  Subjective/Objective:    25 yo admitted with SCC.                Action/Plan: From home with children. Chart reviewed and CM following for DC needs.  Expected Discharge Date:   (UNKNOWN)               Expected Discharge Plan:  Home/Self Care  In-House Referral:     Discharge planning Services  CM Consult  Post Acute Care Choice:    Choice offered to:     DME Arranged:    DME Agency:     HH Arranged:    HH Agency:     Status of Service:  In process, will continue to follow  If discussed at Long Length of Stay Meetings, dates discussed:    Additional CommentsLynnell Catalan, RN 04/26/2016, 11:08 AM [phone]

## 2016-04-26 NOTE — Progress Notes (Signed)
Nursing Note: Blood hung per policy.[email or web address]

## 2016-04-27 ENCOUNTER — Ambulatory Visit (HOSPITAL_COMMUNITY): Payer: Medicaid Other | Admitting: Psychology

## 2016-04-27 ENCOUNTER — Encounter (HOSPITAL_COMMUNITY): Payer: Self-pay | Admitting: Psychology

## 2016-04-27 DIAGNOSIS — K5903 Drug induced constipation: Secondary | ICD-10-CM

## 2016-04-27 DIAGNOSIS — D5700 Hb-SS disease with crisis, unspecified: Secondary | ICD-10-CM | POA: Diagnosis not present

## 2016-04-27 DIAGNOSIS — G8929 Other chronic pain: Secondary | ICD-10-CM | POA: Diagnosis not present

## 2016-04-27 DIAGNOSIS — F329 Major depressive disorder, single episode, unspecified: Secondary | ICD-10-CM | POA: Diagnosis not present

## 2016-04-27 LAB — TYPE AND SCREEN
Blood Product Expiration Date: 201712302359
ISSUE DATE / TIME: 201712120246
Unit Type and Rh: 5100

## 2016-04-27 NOTE — Progress Notes (Signed)
SICKLE CELL SERVICE PROGRESS NOTE  [name] [id number] DOB: March 31, 1991 DOA: 04/24/2016 PCP: Angelica Chessman, MD  Assessment/Plan: Principal Problem:   Hb-SS disease with crisis ([place]) Active Problems:   Major depression, chronic   Chronic pain   Sickle cell anemia with crisis ([place])  1. Hb SS with crisis: Will continue Oxycodone in addition to Dilaudid PCA and clinician assisted doses on a PRN basis. Pt is also continued on Toradol. Re-evaluate to wean pain medications tomorrow. 2. DVT LUE: Pt  Recently started on anticoagulation she is currently on 15 mg BID until 05/10/2016 then she should change to 20 mg daily.  3. Leukocytosis: Pt has a mild leukocytosis which is likely due to her crisis. She  has no current evidence of infection. 4. Depression: Pt was prescribed Cymbalta on a previous admission after being evaluated by Psychiatry. She reports that she has not been taking the medication as she did not know that she was supposed to be taking it. Cymbalta has been resumed on admission. Pt has checked with out-patient pharmacy and they have the prescription for Cymbalta available since October. Pt to pick up Cymbalta from Pharmacy upon discharge.  5. Anemia of Chronic Disease: Pt was ordered and received a bold transfusion overnight for reasons that are unclear as her Hb was 6.9 and patient was asymptomatic.   6. Chronic pain Continue MS Contin.  7. Medication non-compliance: It appears that patient does not have a good understanding of her medications although she appears to talk a good game of understanding. May need a nurse to help with medication management.  8. Chronic Constipation: Pt had a small BM yesterday. Requested Miralax today.   Code Status: Full Code Family Communication: N/A Disposition Plan: Not yet ready for discharge  [place].  Pager [phone]. If 7PM-7AM, please contact night-coverage.  04/27/2016, 9:08 AM  LOS: 3 days   Interim History: Pt  reports that her pain in 7/10 in right knee and thigh, and  5/10 in left knee and thigh. Pain is also localized to left arm at intensity of 6/10. Pt has used  41.8 mg with 44/34:demands/deliveries Pt is using heating pad and has been advised to use K-pad  over her garments so as to prevent burn to the skin. She acknowledges understanding but prefers to use the K-pad directly to skin despite risk. She had a BM this morning.  Pt states that she only has a BM about 3 x week on a regular basis.  Consultants:  None  Procedures:  None  Antibiotics:  None   Objective: Vitals:   04/27/16 0417 04/27/16 0528 04/27/16 0720 04/27/16 0819  BP:  140/75    Pulse:  100    Resp: 17 14 13 12   Temp:  98.7 F (37.1 C)    TempSrc:  Oral    SpO2: 91% 93% 93% 94%  Weight:      Height:       Weight change:   Intake/Output Summary (Last 24 hours) at 04/27/16 0908 Last data filed at 04/27/16 0742  Gross per 24 hour  Intake              840 ml  Output                0 ml  Net              840 ml    General: Alert, awake, oriented x3, in mild distress. Better appearing than yesterday. HEENT: [place]/AT PEERL, EOMI,  mild icterus.  Neck: Trachea midline,  no masses, no thyromegal,y no JVD, no carotid bruit OROPHARYNX:  Moist, No exudate/ erythema/lesions.  Heart: Regular rate and rhythm, without murmurs, rubs, gallops, PMI non-displaced, no heaves or thrills on palpation.  Lungs: Clear to auscultation, no wheezing or rhonchi noted. No increased vocal fremitus resonant to percussion. Abdomen: Soft, nontender, nondistended, positive bowel sounds, no masses no hepatosplenomegaly noted.  Neuro: No focal neurological deficits noted cranial nerves II through XII grossly intact.  Strength at baseline in bilateral upper and lower extremities. Musculoskeletal: No warmth swelling or erythema around joints, no spinal tenderness noted. Psychiatric: Patient alert and oriented x3, good insight and cognition, good  recent to remote recall.    Data Reviewed: Basic Metabolic Panel:  Recent Labs Lab 04/24/16 0442 04/25/16 2042  NA 139 138  K 3.7 4.1  CL 107 106  CO2 23 28  GLUCOSE 102* 104*  BUN 6 13  CREATININE 0.62 0.88  CALCIUM 9.4 8.5*   Liver Function Tests:  Recent Labs Lab 04/24/16 0442 04/25/16 2042  AST 18 22  ALT 22 21  ALKPHOS 51 57  BILITOT 2.3* 3.0*  PROT 7.6 6.8  ALBUMIN 4.7 4.1   No results for input(s): LIPASE, AMYLASE in the last 168 hours. No results for input(s): AMMONIA in the last 168 hours. CBC:  Recent Labs Lab 04/24/16 0442 04/25/16 2042  WBC 12.0* 14.2*  NEUTROABS 8.9* 8.2*  HGB 8.9* 6.9*  HCT 25.7* 20.0*  MCV 86.8 87.0  PLT 692* 562*   Cardiac Enzymes: No results for input(s): CKTOTAL, CKMB, CKMBINDEX, TROPONINI in the last 168 hours. BNP (last 3 results) No results for input(s): BNP in the last 8760 hours.  ProBNP (last 3 results) No results for input(s): PROBNP in the last 8760 hours.  CBG: No results for input(s): GLUCAP in the last 168 hours.  No results found for this or any previous visit (from the past 240 hour(s)).   Studies: US Transvaginal Non-ob  Result Date: 04/03/2016 CLINICAL DATA:  Excessive vaginal bleeding. EXAM: TRANSABDOMINAL AND TRANSVAGINAL ULTRASOUND OF PELVIS TECHNIQUE: Both transabdominal and transvaginal ultrasound examinations of the pelvis were performed. Transabdominal technique was performed for global imaging of the pelvis including uterus, ovaries, adnexal regions, and pelvic cul-de-sac. It was necessary to proceed with endovaginal exam following the transabdominal exam to visualize the uterus, ovaries, and adnexa. COMPARISON:  CT of 02/26/2016. FINDINGS: Uterus Measurements: 7.4 x 4.1 x 5.2 cm. No fibroids or other mass visualized. Endometrium Thickness: Normal, 3 mm.  No focal abnormality visualized. Right ovary Measurements: 2.0 x 1.4 x 1.5 cm. Normal in morphology. Left ovary Measurements: 4.0 x 2.4 x 2.2  cm. An anechoic lesion within measures 2.5 cm and is likely new since the prior CT. An area of curvilinear complexity within or adjacent to this lesion measures on the order of 5 mm. IMPRESSION: 1. No explanation for excessive vaginal bleeding. 2. Left ovarian lesion which is likely a complex dominant follicle or cyst, especially given absence of lesion in this area on 02/26/2016 CT. Given complexity within, consider follow-up with ultrasound at 6-12 weeks. Electronically Signed   By: Abigail Miyamoto M.D.   On: 04/03/2016 15:55   US Pelvis Complete  Result Date: 04/03/2016 CLINICAL DATA:  Excessive vaginal bleeding. EXAM: TRANSABDOMINAL AND TRANSVAGINAL ULTRASOUND OF PELVIS TECHNIQUE: Both transabdominal and transvaginal ultrasound examinations of the pelvis were performed. Transabdominal technique was performed for global imaging of the pelvis including uterus, ovaries, adnexal regions, and pelvic cul-de-sac.  It was necessary to proceed with endovaginal exam following the transabdominal exam to visualize the uterus, ovaries, and adnexa. COMPARISON:  CT of 02/26/2016. FINDINGS: Uterus Measurements: 7.4 x 4.1 x 5.2 cm. No fibroids or other mass visualized. Endometrium Thickness: Normal, 3 mm.  No focal abnormality visualized. Right ovary Measurements: 2.0 x 1.4 x 1.5 cm. Normal in morphology. Left ovary Measurements: 4.0 x 2.4 x 2.2 cm. An anechoic lesion within measures 2.5 cm and is likely new since the prior CT. An area of curvilinear complexity within or adjacent to this lesion measures on the order of 5 mm. IMPRESSION: 1. No explanation for excessive vaginal bleeding. 2. Left ovarian lesion which is likely a complex dominant follicle or cyst, especially given absence of lesion in this area on 02/26/2016 CT. Given complexity within, consider follow-up with ultrasound at 6-12 weeks. Electronically Signed   By: Abigail Miyamoto M.D.   On: 04/03/2016 15:55   Dg Humerus Left  Result Date: 04/17/2016 CLINICAL DATA:   Diffuse left arm pain. No known injury. Sickle cell disease. EXAM: LEFT HUMERUS - 2+ VIEW COMPARISON:  Left shoulder dated 05/06/2012 and 12/11/2011 and left humerus dated 03/11/2009. FINDINGS: Patchy sclerosis is again demonstrated in the humeral head. No other abnormalities are seen. 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 No acute abnormality. Electronically Signed   By: Claudie Revering M.D.   On: 04/17/2016 13:19    Scheduled Meds: . DULoxetine  30 mg Oral Daily  . HYDROmorphone   Intravenous Q4H  . hydroxyurea  1,000 mg Oral Daily  . ketorolac  30 mg Intravenous Q6H  . morphine  30 mg Oral Q12H  . oxyCODONE  20 mg Oral Q4H  . rivaroxaban  15 mg Oral BID WC  . [START ON 05/11/2016] rivaroxaban  20 mg Oral Q supper  . senna-docusate  1 tablet Oral BID   Continuous Infusions: . dextrose 5 % and 0.45% NaCl 1,000 mL (04/27/16 0412)    Principal Problem:   Hb-SS disease with crisis ([place]) Active Problems:   Major depression, chronic   Chronic pain   Sickle cell anemia with crisis ([place])      In excess of 25 minutes. Greater than 50% involved face to face contact with the patient for assessment, counseling and coordination of care.

## 2016-04-27 NOTE — Progress Notes (Signed)
   04/27/16 1400  Clinical Encounter Type  Visited With Patient  Visit Type Psychological support  Counseling intern met with patient to offer emotional support. Patient was initially groggy, as she she stated she has just awakened, but became more alert, oriented, and she conversed clearly with counselor throughout session. Patient stated she continues to be in conflict with her family regarding her boyfriend and  described their concerns. Patient stated that she feels torn between wanting the support of both her family and her boyfriend. Patient maintained that she plans to join her father in [place] and wants her boyfriend to come with her, though she has not discussed this plan with her father. Patient reported that due to the blood clot in her arm, she anticipates more frequent hospital admissions over the next 6 months. During next session, counselor explore how this will impact her decision to move.  Throughout session, patient counselor asked patient to describe how she benefits from the relationship and whether it is in line with her vision for her life.  Jazier Mcglamery A. Philbert Riser, Counseling Intern Department for Spiritual Care and [employer] Supervisor - Scientist, research (physical sciences)

## 2016-04-27 NOTE — Progress Notes (Signed)
[name] is a 25 y.o. female patient who didn't show for her appointment today.  Per medical chart it appears pt may current be admitted to hospital for medical care.          Jan Fireman, LPC

## 2016-04-28 DIAGNOSIS — K5909 Other constipation: Secondary | ICD-10-CM | POA: Diagnosis not present

## 2016-04-28 DIAGNOSIS — D72829 Elevated white blood cell count, unspecified: Secondary | ICD-10-CM | POA: Diagnosis not present

## 2016-04-28 DIAGNOSIS — F329 Major depressive disorder, single episode, unspecified: Secondary | ICD-10-CM | POA: Diagnosis not present

## 2016-04-28 DIAGNOSIS — I82622 Acute embolism and thrombosis of deep veins of left upper extremity: Secondary | ICD-10-CM | POA: Diagnosis not present

## 2016-04-28 DIAGNOSIS — D5700 Hb-SS disease with crisis, unspecified: Secondary | ICD-10-CM | POA: Diagnosis not present

## 2016-04-28 DIAGNOSIS — G8929 Other chronic pain: Secondary | ICD-10-CM | POA: Diagnosis not present

## 2016-04-28 DIAGNOSIS — D638 Anemia in other chronic diseases classified elsewhere: Secondary | ICD-10-CM | POA: Diagnosis not present

## 2016-04-28 LAB — CBC WITH DIFFERENTIAL/PLATELET
BAND NEUTROPHILS: 0 %
BASOS ABS: 0.2 10*3/uL — AB (ref 0.0–0.1)
BASOS PCT: 1 %
Blasts: 0 %
EOS ABS: 1.1 10*3/uL — AB (ref 0.0–0.7)
EOS PCT: 6 %
HEMATOCRIT: 22.1 % — AB (ref 36.0–46.0)
Hemoglobin: 7.5 g/dL — ABNORMAL LOW (ref 12.0–15.0)
LYMPHS ABS: 3.9 10*3/uL (ref 0.7–4.0)
Lymphocytes Relative: 21 %
MCH: 30.2 pg (ref 26.0–34.0)
MCHC: 33.9 g/dL (ref 30.0–36.0)
MCV: 89.1 fL (ref 78.0–100.0)
MONOS PCT: 3 %
Metamyelocytes Relative: 0 %
Monocytes Absolute: 0.6 10*3/uL (ref 0.1–1.0)
Myelocytes: 1 %
NEUTROS ABS: 12.7 10*3/uL — AB (ref 1.7–7.7)
Neutrophils Relative %: 68 %
Other: 0 %
Platelets: 585 10*3/uL — ABNORMAL HIGH (ref 150–400)
Promyelocytes Absolute: 0 %
RBC: 2.48 MIL/uL — AB (ref 3.87–5.11)
RDW: 22.4 % — AB (ref 11.5–15.5)
WBC: 18.5 10*3/uL — ABNORMAL HIGH (ref 4.0–10.5)
nRBC: 0 /100 WBC

## 2016-04-28 LAB — BASIC METABOLIC PANEL
ANION GAP: 5 (ref 5–15)
BUN: 11 mg/dL (ref 6–20)
CALCIUM: 8.5 mg/dL — AB (ref 8.9–10.3)
CO2: 30 mmol/L (ref 22–32)
Chloride: 103 mmol/L (ref 101–111)
Creatinine, Ser: 0.74 mg/dL (ref 0.44–1.00)
GFR calc Af Amer: >60 mL/min (ref >60)
GFR calc non Af Amer: >60 mL/min (ref >60)
GLUCOSE: 101 mg/dL — AB (ref 65–99)
Potassium: 3.9 mmol/L (ref 3.5–5.1)
Sodium: 138 mmol/L (ref 135–145)

## 2016-04-28 LAB — RETICULOCYTES
RBC.: 2.48 MIL/uL — AB (ref 3.87–5.11)
Retic Count, Absolute: 518.3 10*3/uL — ABNORMAL HIGH (ref 19.0–186.0)
Retic Ct Pct: 20.9 % — ABNORMAL HIGH (ref 0.4–3.1)

## 2016-04-28 NOTE — Progress Notes (Signed)
   04/28/16 1700  Clinical Encounter Type  Visited With Patient  Visit Type Psychological support  Counseling intern visited with patient to offer emotional support. Patient was alert and oriented and spoke clearly with counselor throughout session. Patient was upset when counselor arrived because her children are ill, and she expressed frustration over not being able to be with them to soothe and care for them. During the session, patient heard from her family members who were taking the children to another hospital, and patient seemed to be somewhat assured. 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 Patient noted that she became depressed when she had to leave school because she enjoyed the work and had been excited and highly motivated to finish. Patient explained that not having childcare was a barrier to her not being able to finish earlier but that she plans to re-enroll once the children are old enough to attend daycare. Patient still has the goal of moving to [facility] and stated she would find a doctor there for sickle cell and blood clot treatment.  Gwenette Wellons A. Philbert Riser, Counseling Intern Department for Spiritual Care and [employer] Supervisor - Scientist, research (physical sciences)

## 2016-04-28 NOTE — Progress Notes (Signed)
SICKLE CELL SERVICE PROGRESS NOTE  CORABELL GOLDADE [id number] DOB: 06/18/1990 DOA: 04/24/2016 PCP: Angelica Chessman, MD  Assessment/Plan: Principal Problem:   Hb-SS disease with crisis ([place]) Active Problems:   Major depression, chronic   Chronic pain   Sickle cell anemia with crisis ([place])  1. Hb SS with crisis: Will continue current regimen for at least 1 more day. She has been using progressively less IV Dilaudid on the PCA and function is starting to improve. Continue Oxycodone in addition to Dilaudid PCA and clinician assisted doses on a PRN basis. Pt is also continued on Toradol.  2. DVT LUE: Pt  Recently started on anticoagulation she is currently on 15 mg BID until 05/10/2016 then she should change to 20 mg daily.  3. Leukocytosis: Pt has an increased WBC count today. Will check a U/A for any evidence of UTI as she has no other overt evidence of an infection.  4. Depression: Pt was prescribed Cymbalta on a previous admission after being evaluated by Psychiatry. She reports that she has not been taking the medication as she did not know that she was supposed to be taking it. Cymbalta has been resumed on admission. Pt has checked with out-patient pharmacy and they have the prescription for Cymbalta available since October. Pt to pick up Cymbalta from Pharmacy upon discharge.  5. Anemia of Chronic Disease: Pt was ordered and received a bold transfusion overnight for reasons that are unclear as her Hb was 6.9 and patient was asymptomatic.   6. Chronic pain Continue MS Contin.  7. Medication non-compliance: It appears that patient does not have a good understanding of her medications although she appears to talk a good game of understanding. May need a nurse to help with medication management.  8. Chronic Constipation: Pt had a small BM yesterday. Requested Miralax today.   Code Status: Full Code Family Communication: N/A Disposition Plan: Anticipate discharge home in 48 hours.    Eiliyah Reh A.  Pager [phone]. If 7PM-7AM, please contact night-coverage.  04/28/2016, 2:28 PM  LOS: 4 days   Interim History: Pt reports that her pain in 7/10 in right knee and thigh, and  5/10 in left knee and thigh. Pain is also localized to left arm at intensity of 6/10. Pt has used  34.4 mg with 37/34:demands/deliveries Pt is starting to ambulate around unit but still having significant pain while ambulating. Last BM yesterday.   Consultants:  None  Procedures:  None  Antibiotics:  None   Objective: Vitals:   04/28/16 0619 04/28/16 0731 04/28/16 1022 04/28/16 1331  BP:   119/78   Pulse:   95   Resp: 17 13 14 16   Temp:   98.4 F (36.9 C)   TempSrc:   Oral   SpO2: 96% 90% 92% 99%  Weight:      Height:       Weight change:   Intake/Output Summary (Last 24 hours) at 04/28/16 1428 Last data filed at 04/28/16 1022  Gross per 24 hour  Intake          1845.83 ml  Output                0 ml  Net          1845.83 ml    General: Alert, awake, oriented x3, in mild distress. Better appearing than yesterday. HEENT: [place]/AT PEERL, EOMI, mild icterus.  Neck: Trachea midline,  no masses, no thyromegal,y no JVD, no carotid bruit OROPHARYNX:  Moist, No exudate/  erythema/lesions.  Heart: Regular rate and rhythm, without murmurs, rubs, gallops, PMI non-displaced, no heaves or thrills on palpation.  Lungs: Clear to auscultation, no wheezing or rhonchi noted. No increased vocal fremitus resonant to percussion. Abdomen: Soft, nontender, nondistended, positive bowel sounds, no masses no hepatosplenomegaly noted.  Neuro: No focal neurological deficits noted cranial nerves II through XII grossly intact.  Strength at baseline in bilateral upper and lower extremities. Musculoskeletal: No warmth swelling or erythema around joints, no spinal tenderness noted. Psychiatric: Patient alert and oriented x3, good insight and cognition, good recent to remote recall.    Data  Reviewed: Basic Metabolic Panel:  Recent Labs Lab 04/24/16 0442 04/25/16 2042 04/28/16 0600  NA 139 138 138  K 3.7 4.1 3.9  CL 107 106 103  CO2 23 28 30   GLUCOSE 102* 104* 101*  BUN 6 13 11   CREATININE 0.62 0.88 0.74  CALCIUM 9.4 8.5* 8.5*   Liver Function Tests:  Recent Labs Lab 04/24/16 0442 04/25/16 2042  AST 18 22  ALT 22 21  ALKPHOS 51 57  BILITOT 2.3* 3.0*  PROT 7.6 6.8  ALBUMIN 4.7 4.1   No results for input(s): LIPASE, AMYLASE in the last 168 hours. No results for input(s): AMMONIA in the last 168 hours. CBC:  Recent Labs Lab 04/24/16 0442 04/25/16 2042 04/28/16 0600  WBC 12.0* 14.2* 18.5*  NEUTROABS 8.9* 8.2* 12.7*  HGB 8.9* 6.9* 7.5*  HCT 25.7* 20.0* 22.1*  MCV 86.8 87.0 89.1  PLT 692* 562* 585*   Cardiac Enzymes: No results for input(s): CKTOTAL, CKMB, CKMBINDEX, TROPONINI in the last 168 hours. BNP (last 3 results) No results for input(s): BNP in the last 8760 hours.  ProBNP (last 3 results) No results for input(s): PROBNP in the last 8760 hours.  CBG: No results for input(s): GLUCAP in the last 168 hours.  No results found for this or any previous visit (from the past 240 hour(s)).   Studies: US Transvaginal Non-ob  Result Date: 04/03/2016 CLINICAL DATA:  Excessive vaginal bleeding. EXAM: TRANSABDOMINAL AND TRANSVAGINAL ULTRASOUND OF PELVIS TECHNIQUE: Both transabdominal and transvaginal ultrasound examinations of the pelvis were performed. Transabdominal technique was performed for global imaging of the pelvis including uterus, ovaries, adnexal regions, and pelvic cul-de-sac. It was necessary to proceed with endovaginal exam following the transabdominal exam to visualize the uterus, ovaries, and adnexa. COMPARISON:  CT of 02/26/2016. FINDINGS: Uterus Measurements: 7.4 x 4.1 x 5.2 cm. No fibroids or other mass visualized. Endometrium Thickness: Normal, 3 mm.  No focal abnormality visualized. Right ovary Measurements: 2.0 x 1.4 x 1.5 cm.  Normal in morphology. Left ovary Measurements: 4.0 x 2.4 x 2.2 cm. An anechoic lesion within measures 2.5 cm and is likely new since the prior CT. An area of curvilinear complexity within or adjacent to this lesion measures on the order of 5 mm. IMPRESSION: 1. No explanation for excessive vaginal bleeding. 2. Left ovarian lesion which is likely a complex dominant follicle or cyst, especially given absence of lesion in this area on 02/26/2016 CT. Given complexity within, consider follow-up with ultrasound at 6-12 weeks. Electronically Signed   By: Abigail Miyamoto M.D.   On: 04/03/2016 15:55   US Pelvis Complete  Result Date: 04/03/2016 CLINICAL DATA:  Excessive vaginal bleeding. EXAM: TRANSABDOMINAL AND TRANSVAGINAL ULTRASOUND OF PELVIS TECHNIQUE: Both transabdominal and transvaginal ultrasound examinations of the pelvis were performed. Transabdominal technique was performed for global imaging of the pelvis including uterus, ovaries, adnexal regions, and pelvic cul-de-sac. It was necessary  to proceed with endovaginal exam following the transabdominal exam to visualize the uterus, ovaries, and adnexa. COMPARISON:  CT of 02/26/2016. FINDINGS: Uterus Measurements: 7.4 x 4.1 x 5.2 cm. No fibroids or other mass visualized. Endometrium Thickness: Normal, 3 mm.  No focal abnormality visualized. Right ovary Measurements: 2.0 x 1.4 x 1.5 cm. Normal in morphology. Left ovary Measurements: 4.0 x 2.4 x 2.2 cm. An anechoic lesion within measures 2.5 cm and is likely new since the prior CT. An area of curvilinear complexity within or adjacent to this lesion measures on the order of 5 mm. IMPRESSION: 1. No explanation for excessive vaginal bleeding. 2. Left ovarian lesion which is likely a complex dominant follicle or cyst, especially given absence of lesion in this area on 02/26/2016 CT. Given complexity within, consider follow-up with ultrasound at 6-12 weeks. Electronically Signed   By: Abigail Miyamoto M.D.   On: 04/03/2016 15:55    Dg Humerus Left  Result Date: 04/17/2016 CLINICAL DATA:  Diffuse left arm pain. No known injury. Sickle cell disease. EXAM: LEFT HUMERUS - 2+ VIEW COMPARISON:  Left shoulder dated 05/06/2012 and 12/11/2011 and left humerus dated 03/11/2009. FINDINGS: Patchy sclerosis is again demonstrated in the humeral head. No other abnormalities are seen. 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 No acute abnormality. Electronically Signed   By: Claudie Revering M.D.   On: 04/17/2016 13:19    Scheduled Meds: . DULoxetine  30 mg Oral Daily  . HYDROmorphone   Intravenous Q4H  . hydroxyurea  1,000 mg Oral Daily  . ketorolac  30 mg Intravenous Q6H  . morphine  30 mg Oral Q12H  . oxyCODONE  20 mg Oral Q4H  . rivaroxaban  15 mg Oral BID WC  . [START ON 05/11/2016] rivaroxaban  20 mg Oral Q supper  . senna-docusate  1 tablet Oral BID   Continuous Infusions: . dextrose 5 % and 0.45% NaCl 50 mL/hr at 04/28/16 0410    Principal Problem:   Hb-SS disease with crisis [facility]) Active Problems:   Major depression, chronic   Chronic pain   Sickle cell anemia with crisis ([place])      In excess of 25 minutes. Greater than 50% involved face to face contact with the patient for assessment, counseling and coordination of care.

## 2016-04-28 NOTE — Progress Notes (Addendum)
[place] for Xarelto Indication: DVT  Allergies  Allergen Reactions  . Food Hives and Other (See Comments)    Pt states that she is allergic to carrots.   . Latex Rash   Patient Measurements: Height: 5\' 1"  (154.9 cm) Weight: 175 lb (79.4 kg) IBW/kg (Calculated) : 47.8  Vital Signs: Temp: 98.7 F (37.1 C) (12/14 0545) Temp Source: Oral (12/14 0545) BP: 120/77 (12/14 0545) Pulse Rate: 95 (12/14 0545)  Labs:  Recent Labs  04/25/16 2042 04/28/16 0600  HGB 6.9* 7.5*  HCT 20.0* 22.1*  PLT 562* 585*  CREATININE 0.88 0.74   Estimated Creatinine Clearance: 103.4 mL/min (by C-G formula based on SCr of 0.74 mg/dL).  Medications:  Scheduled:  . DULoxetine  30 mg Oral Daily  . HYDROmorphone   Intravenous Q4H  . hydroxyurea  1,000 mg Oral Daily  . ketorolac  30 mg Intravenous Q6H  . morphine  30 mg Oral Q12H  . oxyCODONE  20 mg Oral Q4H  . rivaroxaban  15 mg Oral BID WC  . [START ON 05/11/2016] rivaroxaban  20 mg Oral Q supper  . senna-docusate  1 tablet Oral BID   Assessment: 65 yoF with hx sickle cell anemia, to ED 12/10 with c/o bilateral knee and thigh pain. Recent ED visit: found LUE DVT: started on Xarelto 12/5 with 15mg  bid, then 20mg  daily to begin 12/27  Today, 04/28/2016: Hgb improved Plt high SCr wnl, stable  Goal of Therapy:  Monitor platelets by anticoagulation protocol: Yes   Plan:  Continue Xarelto 15mg  bid with meals, then 20mg  daily beginning 12/27 Renal function stable, pharmacy will follow peripherally for CBC issues  Reuel Boom, PharmD, BCPS Pager: [phone] 04/28/2016, 10:18 AM

## 2016-04-28 NOTE — Progress Notes (Addendum)
Entered room this am and patient had IVF running at 333 ml/hr (only supposed to be running at 50cc/hr).  Patient has been educated numerous times about the risks of changing her IVF on her pump.  She was again re-educated and a sign placed on her pump to remind her not to do this. Patient acted like she was too lethargic to understand.  Will talk to patient when she is more awake.  Patient is apparently able to unlock pump aswell.  Will continue to reinforce education and monitor patient. [name]

## 2016-04-28 NOTE — Care Management Important Message (Signed)
Important Message  Patient Details  Name: [name] MRN: [id number] Date of Birth: [birth year]   Medicare Important Message Given:  Yes    Camillo Flaming 04/28/2016, 11:00 AMImportant Message  Patient Details  Name: [name] MRN: [id number] Date of Birth: 12/27/1990   Medicare Important Message Given:  Yes    Camillo Flaming 04/28/2016, 11:00 AM

## 2016-04-29 DIAGNOSIS — F329 Major depressive disorder, single episode, unspecified: Secondary | ICD-10-CM | POA: Diagnosis not present

## 2016-04-29 DIAGNOSIS — K5909 Other constipation: Secondary | ICD-10-CM | POA: Diagnosis not present

## 2016-04-29 DIAGNOSIS — G8929 Other chronic pain: Secondary | ICD-10-CM | POA: Diagnosis not present

## 2016-04-29 DIAGNOSIS — I82622 Acute embolism and thrombosis of deep veins of left upper extremity: Secondary | ICD-10-CM | POA: Diagnosis not present

## 2016-04-29 DIAGNOSIS — D5700 Hb-SS disease with crisis, unspecified: Secondary | ICD-10-CM | POA: Diagnosis not present

## 2016-04-29 DIAGNOSIS — D638 Anemia in other chronic diseases classified elsewhere: Secondary | ICD-10-CM | POA: Diagnosis not present

## 2016-04-29 LAB — CBC WITH DIFFERENTIAL/PLATELET
BASOS PCT: 1 %
Basophils Absolute: 0.2 10*3/uL — ABNORMAL HIGH (ref 0.0–0.1)
EOS ABS: 1.1 10*3/uL — AB (ref 0.0–0.7)
EOS PCT: 6 %
HEMATOCRIT: 21.4 % — AB (ref 36.0–46.0)
HEMOGLOBIN: 7.3 g/dL — AB (ref 12.0–15.0)
LYMPHS PCT: 16 %
Lymphs Abs: 3 10*3/uL (ref 0.7–4.0)
MCH: 30.5 pg (ref 26.0–34.0)
MCHC: 34.1 g/dL (ref 30.0–36.0)
MCV: 89.5 fL (ref 78.0–100.0)
MONOS PCT: 10 %
Monocytes Absolute: 1.9 10*3/uL — ABNORMAL HIGH (ref 0.1–1.0)
NRBC: 16 /100{WBCs} — AB
Neutro Abs: 12.7 10*3/uL — ABNORMAL HIGH (ref 1.7–7.7)
Neutrophils Relative %: 67 %
Platelets: 540 10*3/uL — ABNORMAL HIGH (ref 150–400)
RBC: 2.39 MIL/uL — ABNORMAL LOW (ref 3.87–5.11)
RDW: 22.8 % — ABNORMAL HIGH (ref 11.5–15.5)
WBC: 18.9 10*3/uL — ABNORMAL HIGH (ref 4.0–10.5)

## 2016-04-29 MED ORDER — HYDROMORPHONE HCL 2 MG/ML IJ SOLN
2.0000 mg | INTRAMUSCULAR | Status: AC
  Administered 2016-04-29 – 2016-04-30 (×11): 2 mg via INTRAVENOUS
  Filled 2016-04-29 (×11): qty 1

## 2016-04-29 MED ORDER — HYDROMORPHONE 1 MG/ML IV SOLN
INTRAVENOUS | Status: DC
  Administered 2016-04-29: 8 mg via INTRAVENOUS
  Administered 2016-04-29: 4.8 mg via INTRAVENOUS
  Administered 2016-04-30 (×2): 4.2 mg via INTRAVENOUS
  Administered 2016-04-30: 4.8 mg via INTRAVENOUS
  Administered 2016-04-30: 7.2 mg via INTRAVENOUS
  Administered 2016-04-30: 1.2 mg via INTRAVENOUS
  Administered 2016-04-30: via INTRAVENOUS
  Administered 2016-04-30: 4.2 mg via INTRAVENOUS
  Administered 2016-04-30: 25 mg via INTRAVENOUS
  Administered 2016-05-01: 20:00:00 via INTRAVENOUS
  Administered 2016-05-01: 6.4 mg via INTRAVENOUS
  Administered 2016-05-01: 7.2 mg via INTRAVENOUS
  Administered 2016-05-01: 4.2 mg via INTRAVENOUS
  Administered 2016-05-01: 2.4 mg via INTRAVENOUS
  Administered 2016-05-01: 7.2 mg via INTRAVENOUS
  Administered 2016-05-01: 8.97 mg via INTRAVENOUS
  Administered 2016-05-02: 13:00:00 via INTRAVENOUS
  Administered 2016-05-02: 6.6 mg via INTRAVENOUS
  Administered 2016-05-02: 4.8 mg via INTRAVENOUS
  Administered 2016-05-02: 7.8 mg via INTRAVENOUS
  Administered 2016-05-02: 6 mg via INTRAVENOUS
  Filled 2016-04-29 (×4): qty 25

## 2016-04-29 MED ORDER — HYDROXYZINE HCL 10 MG PO TABS
10.0000 mg | ORAL_TABLET | Freq: Three times a day (TID) | ORAL | Status: DC | PRN
  Filled 2016-04-29: qty 1

## 2016-04-29 NOTE — Progress Notes (Signed)
Accompanied Raynald Blend to evaluate if patient's friend had come back to pick up the infant.  Patient was found to still be alone with the infant with the infant laying on the bed.  Margarita Grizzle educated the patient for a second time that the infant needed to be strapped into her carrier for safety reasons.  Patient stated "I really don't want to put her in there because she's sick.  I'll just hold her".  Patient educated that a note would be documented.  Patient also educated that if someone wasn't here to pick up patient within 30 minutes that social work would be called because at this point it is a child endangerment issue.  Patient verbalized understanding.

## 2016-04-29 NOTE — Progress Notes (Signed)
SICKLE CELL SERVICE PROGRESS NOTE  [name] [id number] DOB: 1991-02-24 DOA: 04/24/2016 PCP: Angelica Chessman, MD  Assessment/Plan: Principal Problem:   Hb-SS disease with crisis ([place]) Active Problems:   Major depression, chronic   Chronic pain   Sickle cell anemia with crisis ([place])  1. Hb SS with crisis:it appears that patient has gone back into crisis as indicated by the elevation in her WBC with a Neutrophilic and monocytic predominance. Will continue PCA and scheduled Oxycodone and MS Contin. Also schedule Clinician assisted doses of Dilaudid every 2 hours and re-assess tomorrow.  2. DVT LUE: Pt  Recently started on anticoagulation she is currently on 15 mg BID until 05/10/2016 then she should change to 20 mg daily.  3. Leukocytosis: Pt has an increased WBC count today. This is likely a reflection of her going back into crisis. Will check a U/A for any evidence of UTI as she has no other overt evidence of an infection.  4. Depression: Pt was prescribed Cymbalta on a previous admission after being evaluated by Psychiatry. She reports that she has not been taking the medication as she did not know that she was supposed to be taking it. Cymbalta has been resumed on admission. Pt has checked with out-patient pharmacy and they have the prescription for Cymbalta available since October. Pt to pick up Cymbalta from Pharmacy upon discharge.  5. Anemia of Chronic Disease:Hb currently at 6.9 d/dL today after a transfusion 2 days ago.  6. Chronic pain Continue MS Contin.  7. Medication non-compliance: It appears that patient does not have a good understanding of her medications although she appears to talk a good game of understanding. May need a nurse to help with medication management.  8. Chronic Constipation: Pt had a large BM today.  9. Psychosocial: Pt's infant child was brought to her in the room this morning and per policy and  Due to risk to the infant and patient, narcotic  medications were held until the safety could be achieved with regard to patient and infant. Opioid mediations were held and resumed at 10:45 am today (3.5 hours).   Code Status: Full Code Family Communication: N/A Disposition Plan: Not yet ready for discharge   [place].  Pager [phone]. If 7PM-7AM, please contact night-coverage.  04/29/2016, 2:24 PM  LOS: 5 days   Interim History: Pt reports that her pain in 9/10 in B/L knees and left thigh. Pain is also localized to left arm at intensity of 8/10. Pt has used  45.9 mg with 61/50:demands/deliveries  Last BM today.   Consultants:  None  Procedures:  None  Antibiotics:  None   Objective: Vitals:   04/29/16 0609 04/29/16 0820 04/29/16 1048 04/29/16 1121  BP: 111/64  127/74   Pulse: 92  (!) 101   Resp: 15 (!) 21 14 11   Temp: 97.5 F (36.4 C)  97.9 F (36.6 C)   TempSrc: Oral  Oral   SpO2: 96% 95% 97% 95%  Weight:      Height:       Weight change:   Intake/Output Summary (Last 24 hours) at 04/29/16 1424 Last data filed at 04/29/16 0817  Gross per 24 hour  Intake          1516.57 ml  Output                0 ml  Net          1516.57 ml    General: Alert, awake, oriented x3, in  severe distress due to pain. HEENT: [place]/AT PEERL, EOMI, mild icterus.  Neck: Trachea midline,  no masses, no thyromegal,y no JVD, no carotid bruit OROPHARYNX:  Moist, No exudate/ erythema/lesions.  Heart: Regular rate and rhythm, without murmurs, rubs, gallops, PMI non-displaced, no heaves or thrills on palpation.  Lungs: Clear to auscultation, no wheezing or rhonchi noted. No increased vocal fremitus resonant to percussion. Abdomen: Soft, nontender, nondistended, positive bowel sounds, no masses no hepatosplenomegaly noted.  Neuro: No focal neurological deficits noted cranial nerves II through XII grossly intact.  Strength at baseline in bilateral upper and lower extremities. Musculoskeletal: No warmth swelling or erythema around  joints, no spinal tenderness noted. However extremities very tender to touch. Psychiatric: Patient alert and oriented x3, good insight and cognition, good recent to remote recall.    Data Reviewed: Basic Metabolic Panel:  Recent Labs Lab 04/24/16 0442 04/25/16 2042 04/28/16 0600  NA 139 138 138  K 3.7 4.1 3.9  CL 107 106 103  CO2 23 28 30   GLUCOSE 102* 104* 101*  BUN 6 13 11   CREATININE 0.62 0.88 0.74  CALCIUM 9.4 8.5* 8.5*   Liver Function Tests:  Recent Labs Lab 04/24/16 0442 04/25/16 2042  AST 18 22  ALT 22 21  ALKPHOS 51 57  BILITOT 2.3* 3.0*  PROT 7.6 6.8  ALBUMIN 4.7 4.1   No results for input(s): LIPASE, AMYLASE in the last 168 hours. No results for input(s): AMMONIA in the last 168 hours. CBC:  Recent Labs Lab 04/24/16 0442 04/25/16 2042 04/28/16 0600 04/29/16 0617  WBC 12.0* 14.2* 18.5* 18.9*  NEUTROABS 8.9* 8.2* 12.7* 12.7*  HGB 8.9* 6.9* 7.5* 7.3*  HCT 25.7* 20.0* 22.1* 21.4*  MCV 86.8 87.0 89.1 89.5  PLT 692* 562* 585* 540*   Cardiac Enzymes: No results for input(s): CKTOTAL, CKMB, CKMBINDEX, TROPONINI in the last 168 hours. BNP (last 3 results) No results for input(s): BNP in the last 8760 hours.  ProBNP (last 3 results) No results for input(s): PROBNP in the last 8760 hours.  CBG: No results for input(s): GLUCAP in the last 168 hours.  No results found for this or any previous visit (from the past 240 hour(s)).   Studies: US Transvaginal Non-ob  Result Date: 04/03/2016 CLINICAL DATA:  Excessive vaginal bleeding. EXAM: TRANSABDOMINAL AND TRANSVAGINAL ULTRASOUND OF PELVIS TECHNIQUE: Both transabdominal and transvaginal ultrasound examinations of the pelvis were performed. Transabdominal technique was performed for global imaging of the pelvis including uterus, ovaries, adnexal regions, and pelvic cul-de-sac. It was necessary to proceed with endovaginal exam following the transabdominal exam to visualize the uterus, ovaries, and adnexa.  COMPARISON:  CT of 02/26/2016. FINDINGS: Uterus Measurements: 7.4 x 4.1 x 5.2 cm. No fibroids or other mass visualized. Endometrium Thickness: Normal, 3 mm.  No focal abnormality visualized. Right ovary Measurements: 2.0 x 1.4 x 1.5 cm. Normal in morphology. Left ovary Measurements: 4.0 x 2.4 x 2.2 cm. An anechoic lesion within measures 2.5 cm and is likely new since the prior CT. An area of curvilinear complexity within or adjacent to this lesion measures on the order of 5 mm. IMPRESSION: 1. No explanation for excessive vaginal bleeding. 2. Left ovarian lesion which is likely a complex dominant follicle or cyst, especially given absence of lesion in this area on 02/26/2016 CT. Given complexity within, consider follow-up with ultrasound at 6-12 weeks. Electronically Signed   By: Abigail Miyamoto M.D.   On: 04/03/2016 15:55   US Pelvis Complete  Result Date: 04/03/2016 CLINICAL DATA:  Excessive vaginal bleeding. EXAM: TRANSABDOMINAL AND TRANSVAGINAL ULTRASOUND OF PELVIS TECHNIQUE: Both transabdominal and transvaginal ultrasound examinations of the pelvis were performed. Transabdominal technique was performed for global imaging of the pelvis including uterus, ovaries, adnexal regions, and pelvic cul-de-sac. It was necessary to proceed with endovaginal exam following the transabdominal exam to visualize the uterus, ovaries, and adnexa. COMPARISON:  CT of 02/26/2016. FINDINGS: Uterus Measurements: 7.4 x 4.1 x 5.2 cm. No fibroids or other mass visualized. Endometrium Thickness: Normal, 3 mm.  No focal abnormality visualized. Right ovary Measurements: 2.0 x 1.4 x 1.5 cm. Normal in morphology. Left ovary Measurements: 4.0 x 2.4 x 2.2 cm. An anechoic lesion within measures 2.5 cm and is likely new since the prior CT. An area of curvilinear complexity within or adjacent to this lesion measures on the order of 5 mm. IMPRESSION: 1. No explanation for excessive vaginal bleeding. 2. Left ovarian lesion which is likely a complex  dominant follicle or cyst, especially given absence of lesion in this area on 02/26/2016 CT. Given complexity within, consider follow-up with ultrasound at 6-12 weeks. Electronically Signed   By: Abigail Miyamoto M.D.   On: 04/03/2016 15:55   Dg Humerus Left  Result Date: 04/17/2016 CLINICAL DATA:  Diffuse left arm pain. No known injury. Sickle cell disease. EXAM: LEFT HUMERUS - 2+ VIEW COMPARISON:  Left shoulder dated 05/06/2012 and 12/11/2011 and left humerus dated 03/11/2009. FINDINGS: Patchy sclerosis is again demonstrated in the humeral head. No other abnormalities are seen. 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 No acute abnormality. Electronically Signed   By: Claudie Revering M.D.   On: 04/17/2016 13:19    Scheduled Meds: . DULoxetine  30 mg Oral Daily  . HYDROmorphone   Intravenous Q4H  . hydroxyurea  1,000 mg Oral Daily  . morphine  30 mg Oral Q12H  . oxyCODONE  20 mg Oral Q4H  . rivaroxaban  15 mg Oral BID WC  . [START ON 05/11/2016] rivaroxaban  20 mg Oral Q supper  . senna-docusate  1 tablet Oral BID   Continuous Infusions: . dextrose 5 % and 0.45% NaCl 10 mL/hr at 04/28/16 2359    Principal Problem:   Hb-SS disease with crisis [facility]) Active Problems:   Major depression, chronic   Chronic pain   Sickle cell anemia with crisis ([place])      In excess of 25 minutes. Greater than 50% involved face to face contact with the patient for assessment, counseling and coordination of care.

## 2016-04-29 NOTE — Progress Notes (Signed)
Patient educated that her infant couldn't be left alone with her without another adult present per the hospital policy.  AC Laurie accompanied me during this time to educate the patient that is a safety concern particularly if something was to happen to the patient we want someone that could be responsible for the child.  Patient verbalized understanding and Margarita Grizzle spoke with friend of the patient to ask her to come back up to pick up the child.  Patient's PCA turned off until friend can come back and pick up child and MD notified.

## 2016-04-29 NOTE — Progress Notes (Signed)
Entered patient's room and she had pulled off her PAC dressing entirely.  Patient was educated on infection risks and told not to mess with PAC dressing.  Patient reported it was itching and she had pulled it off in her sleep. Sterile technique was performed and dressing/biopatch was changed. Will continue to educate patient and monitor site. Azzie Glatter [place]

## 2016-04-29 NOTE — Progress Notes (Addendum)
I was made aware 0845 that a patient was on 3W with an infant in her room.  It was reported that a friend dropped the infant off and left the room.  No other adult is present to care for the child at the present time.  I went into the patients room to talk with her concerning the above situation and inquire on the return of a caretaker for the infant.  The patient verbalized, "She will be coming back."  I informed the patient of the safety concern for the patient, infant and the [facility] staff.  We are unable to care for the infant and the patient has medications on board that restricts her ability to care for the infant.  I instructed the patient on the safety measure of not allowing the baby in the patients hospital bed as this could create a dangerous situation for both the patient as well as the infant.  The patient verbalized understanding and agreed to place the infant in the child safety seat with harness for the baby's protection.  A family member was called and asked to return to the hospital by the patient and myself for the safety of the patient and infant.  Family verbalized they would be coming in the next thirty minutes.  Rollene Fare RN was present in the room. Jena Gauss Department Director and Drue Dun Assistant director were notified of the above conversation and safety concern.  Staff closely monitoring till adult returns to care for the baby.  Call light within reach, patient denies questions at this time. 0930- Back to evaluate situation with patient and infant child.  The patient and child were found in the bed together with patient feeding the baby a bottle.  I spoke with the patient concerning an estimated arrival time of adult to care for the baby.  The patient verbalized within the next thirty mins.  I educated the patient on the baby's safety of needing to be in the child protective seat until adult could care for the baby. CN aware of the situation and staff monitoring. 1000-Back to reevaluate  patient and baby.  No adult present in the room at this time.  I requested that the patient call the caregiver and see approximate time of arrival.  I spoke with the family member per the consent of the patient.  Family member said, "I will be there in ten minutes."   I spoke at great lengths with the patient concerning the immediate need of a caretaker for the baby to allow the baby to stay at the bedside.  I expressed we could look into resources if needed for child care and that Social work would need to be notified if the caretaker was unable to come by 1030.  Safety risk were discussed and patient was able to verbalize and repeat risk. Dr. Zigmund Daniel called and I spoke with her concerning [place] developing a collaborative approach to helping the patient and baby.  The plan is for adult supervision by friend or family member at the bedside or friend or family to take the baby home to care for.  Should the friend or family member not return to pick baby up, social worker Roselyn Reef has been called and message left for resources concerning this situation.   1030-per patient mother in to pick baby up and has taken baby to care for.  Patient verbalized her consent to this and expresses no concerns, questions or needs at this time.  Patient resting quietly in bed,  pain medicine given by Norberta Keens and Dr. Zigmund Daniel notified patient and baby are both safe.  I will still reach out to social work on resources we can provide to the patient.

## 2016-04-29 NOTE — Progress Notes (Signed)
Patient continues to turn her bed alarm off after educating patient about her safety and her history of falls. Patient has verbalized understanding surrounding keeping bed alarm on however patient turns off bed alarm and gets up to the restroom on her own.

## 2016-04-30 DIAGNOSIS — D638 Anemia in other chronic diseases classified elsewhere: Secondary | ICD-10-CM | POA: Diagnosis not present

## 2016-04-30 DIAGNOSIS — F329 Major depressive disorder, single episode, unspecified: Secondary | ICD-10-CM | POA: Diagnosis not present

## 2016-04-30 DIAGNOSIS — G8929 Other chronic pain: Secondary | ICD-10-CM | POA: Diagnosis not present

## 2016-04-30 DIAGNOSIS — D5700 Hb-SS disease with crisis, unspecified: Secondary | ICD-10-CM | POA: Diagnosis not present

## 2016-04-30 DIAGNOSIS — I82622 Acute embolism and thrombosis of deep veins of left upper extremity: Secondary | ICD-10-CM | POA: Diagnosis not present

## 2016-04-30 DIAGNOSIS — K5909 Other constipation: Secondary | ICD-10-CM | POA: Diagnosis not present

## 2016-04-30 LAB — CBC WITH DIFFERENTIAL/PLATELET
BASOS PCT: 0 %
Basophils Absolute: 0 10*3/uL (ref 0.0–0.1)
EOS PCT: 8 %
Eosinophils Absolute: 1.2 10*3/uL — ABNORMAL HIGH (ref 0.0–0.7)
HEMATOCRIT: 19.8 % — AB (ref 36.0–46.0)
HEMOGLOBIN: 6.6 g/dL — AB (ref 12.0–15.0)
LYMPHS ABS: 2.4 10*3/uL (ref 0.7–4.0)
Lymphocytes Relative: 16 %
MCH: 30 pg (ref 26.0–34.0)
MCHC: 33.3 g/dL (ref 30.0–36.0)
MCV: 90 fL (ref 78.0–100.0)
MONOS PCT: 12 %
Monocytes Absolute: 1.8 10*3/uL — ABNORMAL HIGH (ref 0.1–1.0)
Neutro Abs: 9.5 10*3/uL — ABNORMAL HIGH (ref 1.7–7.7)
Neutrophils Relative %: 64 %
Platelets: 450 10*3/uL — ABNORMAL HIGH (ref 150–400)
RBC: 2.2 MIL/uL — AB (ref 3.87–5.11)
RDW: 21.3 % — ABNORMAL HIGH (ref 11.5–15.5)
WBC: 14.9 10*3/uL — AB (ref 4.0–10.5)

## 2016-04-30 LAB — URINALYSIS, ROUTINE W REFLEX MICROSCOPIC
BILIRUBIN URINE: NEGATIVE
GLUCOSE, UA: NEGATIVE mg/dL
Ketones, ur: NEGATIVE mg/dL
LEUKOCYTES UA: NEGATIVE
NITRITE: NEGATIVE
PH: 6 (ref 5.0–8.0)
Protein, ur: NEGATIVE mg/dL
SPECIFIC GRAVITY, URINE: 1.006 (ref 1.005–1.030)

## 2016-04-30 LAB — RETICULOCYTES
RBC.: 2.2 MIL/uL — AB (ref 3.87–5.11)
RETIC COUNT ABSOLUTE: 473 10*3/uL — AB (ref 19.0–186.0)
Retic Ct Pct: 21.5 % — ABNORMAL HIGH (ref 0.4–3.1)

## 2016-04-30 LAB — LACTATE DEHYDROGENASE: LDH: 277 U/L — ABNORMAL HIGH (ref 98–192)

## 2016-04-30 NOTE — Progress Notes (Signed)
CRITICAL VALUE ALERT  Critical value received:  HGB 6.6  Date of notification:  04/30/2016    Time of notification:  7:33 AM   Critical value read back:Yes.    Nurse who received alert:  Jeanie Sewer   MD notified (1st page):  Dr Clementeen Graham  Time of first page:  7:33 AM   MD notified (2nd page):  Time of second page:  Responding MD  Time MD responded:

## 2016-04-30 NOTE — Progress Notes (Signed)
IV pump exchanged per conversation with MD and old pump tagged and sent to be evaluated for malfunction. Will continue to monitor.

## 2016-04-30 NOTE — Progress Notes (Signed)
RN hung fluids for patient at 0936 at a rate of 100cc/hr for a volume of 1000cc.  At 1530 patient up to ambulate around the unit and RN noted that the volume left to be infused is 130cc.  RN questioned pt regarding this VTBI as it is too low for the rate at which the fluids have been infusing and for the timeframe in which they have infused.  Pt denies any pump manipulation, stating "i have been asleep".  Rn pointed out that pt has been awake as RN has been in room multiple times to administer medications and check on pt.  RN followed up with patient at approximately 1620 and noted that the pump now states volume to be infused 310cc.  RN questioned the pt about how the volume has changed on the pump and noted that the bag of fluid hanging is the original bag hung by this RN and does not in fact have enough volume to reflect the amount left to be infused.  Patient states that she has not changed the pump and does not know how to change the volume to be infused.  RN notified MD of this information.  MD questioned RN regarding pt condition and if any negative effects have been noted.  Pt remains alert and oriented, awake and coherent.  Respiratory pattern in unchanged.  At this time no changes are to be made to patient plan of care per MD, however MD will round on patient shortly and requests that RN is present in room at that time. Will continue to monitor.

## 2016-04-30 NOTE — Progress Notes (Signed)
SICKLE CELL SERVICE PROGRESS NOTE  MERAV BURCHILL [id number] DOB: October 27, 1990 DOA: 04/24/2016 PCP: Angelica Chessman, MD  Assessment/Plan: Principal Problem:   Hb-SS disease with crisis ([place]) Active Problems:   Major depression, chronic   Chronic pain   Sickle cell anemia with crisis ([place])  1. Hb SS with crisis: Will continue PCA and scheduled Oxycodone and MS Contin. Change Clinician assisted doses of Dilaudid every 2 hours on a PRN basis and re-assess tomorrow.  2. DVT LUE: Pt  Recently started on anticoagulation she is currently on 15 mg BID until 05/10/2016 then she should change to 20 mg daily.  3. Leukocytosis:Improved with treatment of crisis.  U/A negative for UTI.  4. Depression: Pt was prescribed Cymbalta on a previous admission after being evaluated by Psychiatry. She reports that she has not been taking the medication as she did not know that she was supposed to be taking it. Cymbalta has been resumed on admission. Pt has checked with out-patient pharmacy and they have the prescription for Cymbalta available since October. Pt to pick up Cymbalta from Pharmacy upon discharge.  5. Anemia of Chronic Disease:Hb stable at 6.6 g/dL today after a transfusion 2 days ago.  6. Chronic pain Continue MS Contin.  7. Medication non-compliance: It appears that patient does not have a good understanding of her medications although she appears to talk a good game of understanding. May need a nurse to help with medication management.  8. Chronic Constipation: Pt had a large BM today.  9. Medical Risk/Ethical responsibility: Nurse reports that there was a discrepancy in the rate and volume  of the patients fluids. The patient denies tampering with the rate on the infusion pump and additionally she was not observed tampering with the pump. Her Physical examination is not indicative of any excessive opiates or any adverse events or effects. I have spoken with the patient in the presence of the  nurse and reinforced informed consent with regard to her medications and medical treatment while under my care. Pt has had an admitted history of tampering with the pump on her last admission stating that she thought that the increased fluids would help her to feel better. I have explained to any patient changing the rate at which fluids are infused constituted prescribing and this can only be done by Medical personal licensed to prescribe. She verbalized understanding but continued to denies tampering with infusion pump. In the absence of any indication of realized medical risk I am continuing her care as prescribed, The pump is being exchanged and sent to biomedical engineering for evaluation of function and safety. I have discussed with nurse that if there is any further concern a safety sitter may be considered.   Code Status: Full Code Family Communication: N/A Disposition Plan: Not yet ready for discharge   [place].  Pager [phone]. If 7PM-7AM, please contact night-coverage.  04/30/2016, 5:26 PM  LOS: 6 days   Interim History: Pt reports that her pain in 7/10 in B/L knees and left thigh. Pain is also localized to left arm at intensity of 7/10. Pt has used 28.2 mg with 51/47:demands/deliveries  Last BM yesterday.   Consultants:  None  Procedures:  None  Antibiotics:  None   Objective: Vitals:   04/30/16 1004 04/30/16 1200 04/30/16 1500 04/30/16 1600  BP: 108/76  104/71   Pulse: 80  81   Resp: 10 12 12 13   Temp: 98.2 F (36.8 C)  99.1 F (37.3 C)   TempSrc: Oral  Oral   SpO2: 99% 98% 99% 100%  Weight:      Height:       Weight change:   Intake/Output Summary (Last 24 hours) at 04/30/16 1726 Last data filed at 04/30/16 1515  Gross per 24 hour  Intake             3045 ml  Output             3400 ml  Net             -355 ml    General: Alert, awake, oriented x3, in no apparent distress.  HEENT: [place]/AT PEERL, EOMI, anicteric, peri-orbital puffiness  present.   Neck: Trachea midline,  no masses, no thyromegal,y no JVD, no carotid bruit OROPHARYNX:  Moist, No exudate/ erythema/lesions.  Heart: Regular rate and rhythm, without murmurs, rubs, gallops, PMI non-displaced, no heaves or thrills on palpation.  Lungs: Clear to auscultation, no wheezing or rhonchi noted. No increased vocal fremitus resonant to percussion. Abdomen: Soft, nontender, nondistended, positive bowel sounds, no masses no hepatosplenomegaly noted.  Neuro: No focal neurological deficits noted cranial nerves II through XII grossly intact.  Strength at baseline in bilateral upper and lower extremities. Musculoskeletal: No warmth swelling or erythema around joints, no spinal tenderness noted. However extremities very tender to touch. Psychiatric: Patient alert and oriented x3, good insight and cognition, good recent to remote recall.    Data Reviewed: Basic Metabolic Panel:  Recent Labs Lab 04/24/16 0442 04/25/16 2042 04/28/16 0600  NA 139 138 138  K 3.7 4.1 3.9  CL 107 106 103  CO2 23 28 30   GLUCOSE 102* 104* 101*  BUN 6 13 11   CREATININE 0.62 0.88 0.74  CALCIUM 9.4 8.5* 8.5*   Liver Function Tests:  Recent Labs Lab 04/24/16 0442 04/25/16 2042  AST 18 22  ALT 22 21  ALKPHOS 51 57  BILITOT 2.3* 3.0*  PROT 7.6 6.8  ALBUMIN 4.7 4.1   No results for input(s): LIPASE, AMYLASE in the last 168 hours. No results for input(s): AMMONIA in the last 168 hours. CBC:  Recent Labs Lab 04/24/16 0442 04/25/16 2042 04/28/16 0600 04/29/16 0617 04/30/16 0602  WBC 12.0* 14.2* 18.5* 18.9* 14.9*  NEUTROABS 8.9* 8.2* 12.7* 12.7* 9.5*  HGB 8.9* 6.9* 7.5* 7.3* 6.6*  HCT 25.7* 20.0* 22.1* 21.4* 19.8*  MCV 86.8 87.0 89.1 89.5 90.0  PLT 692* 562* 585* 540* 450*   Cardiac Enzymes: No results for input(s): CKTOTAL, CKMB, CKMBINDEX, TROPONINI in the last 168 hours. BNP (last 3 results) No results for input(s): BNP in the last 8760 hours.  ProBNP (last 3 results) No  results for input(s): PROBNP in the last 8760 hours.  CBG: No results for input(s): GLUCAP in the last 168 hours.  No results found for this or any previous visit (from the past 240 hour(s)).   Studies: US Transvaginal Non-ob  Result Date: 04/03/2016 CLINICAL DATA:  Excessive vaginal bleeding. EXAM: TRANSABDOMINAL AND TRANSVAGINAL ULTRASOUND OF PELVIS TECHNIQUE: Both transabdominal and transvaginal ultrasound examinations of the pelvis were performed. Transabdominal technique was performed for global imaging of the pelvis including uterus, ovaries, adnexal regions, and pelvic cul-de-sac. It was necessary to proceed with endovaginal exam following the transabdominal exam to visualize the uterus, ovaries, and adnexa. COMPARISON:  CT of 02/26/2016. FINDINGS: Uterus Measurements: 7.4 x 4.1 x 5.2 cm. No fibroids or other mass visualized. Endometrium Thickness: Normal, 3 mm.  No focal abnormality visualized. Right ovary Measurements: 2.0 x 1.4 x 1.5 cm.  Normal in morphology. Left ovary Measurements: 4.0 x 2.4 x 2.2 cm. An anechoic lesion within measures 2.5 cm and is likely new since the prior CT. An area of curvilinear complexity within or adjacent to this lesion measures on the order of 5 mm. IMPRESSION: 1. No explanation for excessive vaginal bleeding. 2. Left ovarian lesion which is likely a complex dominant follicle or cyst, especially given absence of lesion in this area on 02/26/2016 CT. Given complexity within, consider follow-up with ultrasound at 6-12 weeks. Electronically Signed   By: Abigail Miyamoto M.D.   On: 04/03/2016 15:55   US Pelvis Complete  Result Date: 04/03/2016 CLINICAL DATA:  Excessive vaginal bleeding. EXAM: TRANSABDOMINAL AND TRANSVAGINAL ULTRASOUND OF PELVIS TECHNIQUE: Both transabdominal and transvaginal ultrasound examinations of the pelvis were performed. Transabdominal technique was performed for global imaging of the pelvis including uterus, ovaries, adnexal regions, and pelvic  cul-de-sac. It was necessary to proceed with endovaginal exam following the transabdominal exam to visualize the uterus, ovaries, and adnexa. COMPARISON:  CT of 02/26/2016. FINDINGS: Uterus Measurements: 7.4 x 4.1 x 5.2 cm. No fibroids or other mass visualized. Endometrium Thickness: Normal, 3 mm.  No focal abnormality visualized. Right ovary Measurements: 2.0 x 1.4 x 1.5 cm. Normal in morphology. Left ovary Measurements: 4.0 x 2.4 x 2.2 cm. An anechoic lesion within measures 2.5 cm and is likely new since the prior CT. An area of curvilinear complexity within or adjacent to this lesion measures on the order of 5 mm. IMPRESSION: 1. No explanation for excessive vaginal bleeding. 2. Left ovarian lesion which is likely a complex dominant follicle or cyst, especially given absence of lesion in this area on 02/26/2016 CT. Given complexity within, consider follow-up with ultrasound at 6-12 weeks. Electronically Signed   By: Abigail Miyamoto M.D.   On: 04/03/2016 15:55   Dg Humerus Left  Result Date: 04/17/2016 CLINICAL DATA:  Diffuse left arm pain. No known injury. Sickle cell disease. EXAM: LEFT HUMERUS - 2+ VIEW COMPARISON:  Left shoulder dated 05/06/2012 and 12/11/2011 and left humerus dated 03/11/2009. FINDINGS: Patchy sclerosis is again demonstrated in the humeral head. No other abnormalities are seen. 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 No acute abnormality. Electronically Signed   By: Claudie Revering M.D.   On: 04/17/2016 13:19    Scheduled Meds: . DULoxetine  30 mg Oral Daily  . HYDROmorphone   Intravenous Q4H  . hydroxyurea  1,000 mg Oral Daily  . morphine  30 mg Oral Q12H  . oxyCODONE  20 mg Oral Q4H  . rivaroxaban  15 mg Oral BID WC  . [START ON 05/11/2016] rivaroxaban  20 mg Oral Q supper  . senna-docusate  1 tablet Oral BID   Continuous Infusions: . dextrose 5 % and 0.45% NaCl 100 mL/hr at 04/30/16 1725    Principal Problem:   Hb-SS disease with crisis  [facility]) Active Problems:   Major depression, chronic   Chronic pain   Sickle cell anemia with crisis ([place])      In excess of 25 minutes. Greater than 50% involved face to face contact with the patient for assessment, counseling and coordination of care.

## 2016-04-30 NOTE — Progress Notes (Signed)
Patient ambulated on room air, O2 sats 99-100%.

## 2016-05-01 DIAGNOSIS — G8929 Other chronic pain: Secondary | ICD-10-CM | POA: Diagnosis not present

## 2016-05-01 DIAGNOSIS — K5909 Other constipation: Secondary | ICD-10-CM | POA: Diagnosis not present

## 2016-05-01 DIAGNOSIS — F329 Major depressive disorder, single episode, unspecified: Secondary | ICD-10-CM | POA: Diagnosis not present

## 2016-05-01 DIAGNOSIS — D5700 Hb-SS disease with crisis, unspecified: Secondary | ICD-10-CM | POA: Diagnosis not present

## 2016-05-01 DIAGNOSIS — D638 Anemia in other chronic diseases classified elsewhere: Secondary | ICD-10-CM | POA: Diagnosis not present

## 2016-05-01 DIAGNOSIS — I82622 Acute embolism and thrombosis of deep veins of left upper extremity: Secondary | ICD-10-CM | POA: Diagnosis not present

## 2016-05-01 LAB — RETICULOCYTES
RBC.: 2.77 MIL/uL — ABNORMAL LOW (ref 3.87–5.11)
RETIC CT PCT: 23 % — AB (ref 0.4–3.1)
Retic Count, Absolute: 637.1 10*3/uL — ABNORMAL HIGH (ref 19.0–186.0)

## 2016-05-01 NOTE — Progress Notes (Signed)
SICKLE CELL SERVICE PROGRESS NOTE  [name] [id number] DOB: 08-28-1990 DOA: 04/24/2016 PCP: Angelica Chessman, MD  Assessment/Plan: Principal Problem:   Hb-SS disease with crisis ([place]) Active Problems:   Major depression, chronic   Chronic pain   Sickle cell anemia with crisis ([place])  1. Hb SS with crisis: Will continue PCA and scheduled Oxycodone and MS Contin. Discontinue Clinician assisted doses of Dilaudid every 2 hours  tomorrow. Anticipate discharge home tomorrow.  2. DVT LUE: Pt  Recently started on anticoagulation she is currently on 15 mg BID until 05/10/2016 then she should change to 20 mg daily.  3. Leukocytosis:Improved with treatment of crisis.  U/A negative for UTI.  4. Depression: Pt was prescribed Cymbalta on a previous admission after being evaluated by Psychiatry. She reports that she has not been taking the medication as she did not know that she was supposed to be taking it. Cymbalta has been resumed on admission. Pt has checked with out-patient pharmacy and they have the prescription for Cymbalta available since October. Pt to pick up Cymbalta from Pharmacy upon discharge.  5. Anemia of Chronic Disease:Hb stable at 6.6 g/dL today after a transfusion 2 days ago.  6. Chronic pain Continue MS Contin.  7. Medication non-compliance: It appears that patient does not have a good understanding of her medications although she appears to talk a good game of understanding. May need a nurse to help with medication management.  8. Chronic Constipation: Pt had a large BM last night.     Code Status: Full Code Family Communication: N/A Disposition Plan: Not yet ready for discharge   [place].  Pager [phone]. If 7PM-7AM, please contact night-coverage.  05/01/2016, 3:13 PM  LOS: 7 days   Interim History: Pt reports that her pain in 7/10 in B/L knees and left thigh. Pain is also localized to left arm at intensity of 7/10. Pt has used 26.97 mg with  46/45:demands/deliveries  Last BM last night.  Consultants:  None  Procedures:  None  Antibiotics:  None   Objective: Vitals:   05/01/16 0800 05/01/16 1015 05/01/16 1200 05/01/16 1431  BP:  115/74  114/73  Pulse:  89  90  Resp: 14 13 15 14   Temp:  98.7 F (37.1 C)  98.9 F (37.2 C)  TempSrc:  Oral  Oral  SpO2: 100% 97% 98% 98%  Weight:      Height:       Weight change: 0.153 kg (5.4 oz)  Intake/Output Summary (Last 24 hours) at 05/01/16 1513 Last data filed at 05/01/16 1431  Gross per 24 hour  Intake             2475 ml  Output             1500 ml  Net              975 ml    General: Alert, awake, oriented x3, in no apparent distress. Able to ambulate with minimal pain. HEENT: [place]/AT PEERL, EOMI, anicteric, peri-orbital puffiness no longer present.  Heart: Regular rate and rhythm, without murmurs, rubs, gallops, PMI non-displaced, no heaves or thrills on palpation.  Lungs: Clear to auscultation, no wheezing or rhonchi noted. No increased vocal fremitus resonant to percussion. Abdomen: Soft, nontender, nondistended, positive bowel sounds, no masses no hepatosplenomegaly noted.  Neuro: No focal neurological deficits noted cranial nerves II through XII grossly intact.  Strength at baseline in bilateral upper and lower extremities. Musculoskeletal: No warmth swelling or erythema around  joints, no spinal tenderness noted. However extremities very tender to touch. Psychiatric: Patient alert and oriented x3, good insight and cognition, good recent to remote recall.    Data Reviewed: Basic Metabolic Panel:  Recent Labs Lab 04/25/16 2042 04/28/16 0600  NA 138 138  K 4.1 3.9  CL 106 103  CO2 28 30  GLUCOSE 104* 101*  BUN 13 11  CREATININE 0.88 0.74  CALCIUM 8.5* 8.5*   Liver Function Tests:  Recent Labs Lab 04/25/16 2042  AST 22  ALT 21  ALKPHOS 57  BILITOT 3.0*  PROT 6.8  ALBUMIN 4.1   No results for input(s): LIPASE, AMYLASE in the last 168  hours. No results for input(s): AMMONIA in the last 168 hours. CBC:  Recent Labs Lab 04/25/16 2042 04/28/16 0600 04/29/16 0617 04/30/16 0602  WBC 14.2* 18.5* 18.9* 14.9*  NEUTROABS 8.2* 12.7* 12.7* 9.5*  HGB 6.9* 7.5* 7.3* 6.6*  HCT 20.0* 22.1* 21.4* 19.8*  MCV 87.0 89.1 89.5 90.0  PLT 562* 585* 540* 450*   Cardiac Enzymes: No results for input(s): CKTOTAL, CKMB, CKMBINDEX, TROPONINI in the last 168 hours. BNP (last 3 results) No results for input(s): BNP in the last 8760 hours.  ProBNP (last 3 results) No results for input(s): PROBNP in the last 8760 hours.  CBG: No results for input(s): GLUCAP in the last 168 hours.  No results found for this or any previous visit (from the past 240 hour(s)).   Studies: US Transvaginal Non-ob  Result Date: 04/03/2016 CLINICAL DATA:  Excessive vaginal bleeding. EXAM: TRANSABDOMINAL AND TRANSVAGINAL ULTRASOUND OF PELVIS TECHNIQUE: Both transabdominal and transvaginal ultrasound examinations of the pelvis were performed. Transabdominal technique was performed for global imaging of the pelvis including uterus, ovaries, adnexal regions, and pelvic cul-de-sac. It was necessary to proceed with endovaginal exam following the transabdominal exam to visualize the uterus, ovaries, and adnexa. COMPARISON:  CT of 02/26/2016. FINDINGS: Uterus Measurements: 7.4 x 4.1 x 5.2 cm. No fibroids or other mass visualized. Endometrium Thickness: Normal, 3 mm.  No focal abnormality visualized. Right ovary Measurements: 2.0 x 1.4 x 1.5 cm. Normal in morphology. Left ovary Measurements: 4.0 x 2.4 x 2.2 cm. An anechoic lesion within measures 2.5 cm and is likely new since the prior CT. An area of curvilinear complexity within or adjacent to this lesion measures on the order of 5 mm. IMPRESSION: 1. No explanation for excessive vaginal bleeding. 2. Left ovarian lesion which is likely a complex dominant follicle or cyst, especially given absence of lesion in this area on  02/26/2016 CT. Given complexity within, consider follow-up with ultrasound at 6-12 weeks. Electronically Signed   By: Abigail Miyamoto M.D.   On: 04/03/2016 15:55   US Pelvis Complete  Result Date: 04/03/2016 CLINICAL DATA:  Excessive vaginal bleeding. EXAM: TRANSABDOMINAL AND TRANSVAGINAL ULTRASOUND OF PELVIS TECHNIQUE: Both transabdominal and transvaginal ultrasound examinations of the pelvis were performed. Transabdominal technique was performed for global imaging of the pelvis including uterus, ovaries, adnexal regions, and pelvic cul-de-sac. It was necessary to proceed with endovaginal exam following the transabdominal exam to visualize the uterus, ovaries, and adnexa. COMPARISON:  CT of 02/26/2016. FINDINGS: Uterus Measurements: 7.4 x 4.1 x 5.2 cm. No fibroids or other mass visualized. Endometrium Thickness: Normal, 3 mm.  No focal abnormality visualized. Right ovary Measurements: 2.0 x 1.4 x 1.5 cm. Normal in morphology. Left ovary Measurements: 4.0 x 2.4 x 2.2 cm. An anechoic lesion within measures 2.5 cm and is likely new since the prior CT. An  area of curvilinear complexity within or adjacent to this lesion measures on the order of 5 mm. IMPRESSION: 1. No explanation for excessive vaginal bleeding. 2. Left ovarian lesion which is likely a complex dominant follicle or cyst, especially given absence of lesion in this area on 02/26/2016 CT. Given complexity within, consider follow-up with ultrasound at 6-12 weeks. Electronically Signed   By: Abigail Miyamoto M.D.   On: 04/03/2016 15:55   Dg Humerus Left  Result Date: 04/17/2016 CLINICAL DATA:  Diffuse left arm pain. No known injury. Sickle cell disease. EXAM: LEFT HUMERUS - 2+ VIEW COMPARISON:  Left shoulder dated 05/06/2012 and 12/11/2011 and left humerus dated 03/11/2009. FINDINGS: Patchy sclerosis is again demonstrated in the humeral head. No other abnormalities are seen. 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 No acute  abnormality. Electronically Signed   By: Claudie Revering M.D.   On: 04/17/2016 13:19    Scheduled Meds: . DULoxetine  30 mg Oral Daily  . HYDROmorphone   Intravenous Q4H  . hydroxyurea  1,000 mg Oral Daily  . morphine  30 mg Oral Q12H  . oxyCODONE  20 mg Oral Q4H  . rivaroxaban  15 mg Oral BID WC  . [START ON 05/11/2016] rivaroxaban  20 mg Oral Q supper  . senna-docusate  1 tablet Oral BID   Continuous Infusions: . dextrose 5 % and 0.45% NaCl 10 mL/hr at 04/30/16 1728    Principal Problem:   Hb-SS disease with crisis [facility]) Active Problems:   Major depression, chronic   Chronic pain   Sickle cell anemia with crisis ([place])      In excess of 25 minutes. Greater than 50% involved face to face contact with the patient for assessment, counseling and coordination of care.

## 2016-05-02 ENCOUNTER — Telehealth: Payer: Self-pay

## 2016-05-02 DIAGNOSIS — Z7901 Long term (current) use of anticoagulants: Secondary | ICD-10-CM | POA: Diagnosis not present

## 2016-05-02 DIAGNOSIS — G8929 Other chronic pain: Secondary | ICD-10-CM | POA: Diagnosis not present

## 2016-05-02 DIAGNOSIS — D5700 Hb-SS disease with crisis, unspecified: Secondary | ICD-10-CM | POA: Diagnosis not present

## 2016-05-02 DIAGNOSIS — A6009 Herpesviral infection of other urogenital tract: Secondary | ICD-10-CM

## 2016-05-02 DIAGNOSIS — F329 Major depressive disorder, single episode, unspecified: Secondary | ICD-10-CM | POA: Diagnosis not present

## 2016-05-02 LAB — CBC WITH DIFFERENTIAL/PLATELET
Basophils Absolute: 0 10*3/uL (ref 0.0–0.1)
Basophils Relative: 0 %
EOS PCT: 7 %
Eosinophils Absolute: 0.9 10*3/uL — ABNORMAL HIGH (ref 0.0–0.7)
HEMATOCRIT: 25 % — AB (ref 36.0–46.0)
HEMOGLOBIN: 8.4 g/dL — AB (ref 12.0–15.0)
LYMPHS ABS: 2.9 10*3/uL (ref 0.7–4.0)
Lymphocytes Relative: 23 %
MCH: 30.3 pg (ref 26.0–34.0)
MCHC: 33.6 g/dL (ref 30.0–36.0)
MCV: 90.3 fL (ref 78.0–100.0)
MONO ABS: 1.7 10*3/uL — AB (ref 0.1–1.0)
Monocytes Relative: 14 %
NEUTROS ABS: 6.9 10*3/uL (ref 1.7–7.7)
Neutrophils Relative %: 56 %
Platelets: 546 10*3/uL — ABNORMAL HIGH (ref 150–400)
RBC: 2.77 MIL/uL — AB (ref 3.87–5.11)
RDW: 21.1 % — AB (ref 11.5–15.5)
WBC: 12.4 10*3/uL — AB (ref 4.0–10.5)
nRBC: 13 /100 WBC — ABNORMAL HIGH

## 2016-05-02 MED ORDER — DULOXETINE HCL 30 MG PO CPEP: 30.0000 mg | ORAL_CAPSULE | Freq: Every day | ORAL | Status: DC

## 2016-05-02 MED ORDER — HEPARIN SOD (PORK) LOCK FLUSH 100 UNIT/ML IV SOLN: 500.0000 [IU] | INTRAVENOUS | Status: DC

## 2016-05-02 MED ORDER — VALACYCLOVIR HCL 1 G PO TABS: 1000.0000 mg | ORAL_TABLET | Freq: Every day | ORAL | 1 refills | Status: DC

## 2016-05-02 MED ORDER — HEPARIN SOD (PORK) LOCK FLUSH 100 UNIT/ML IV SOLN
500.0000 [IU] | INTRAVENOUS | Status: DC | PRN
  Filled 2016-05-02: qty 5

## 2016-05-02 NOTE — Consult Note (Signed)
   [facility] CM Inpatient Consult   05/02/2016  [name] February 14, 1991 [id number]    [facility] Care Management follow up on referral. Telephone call made to speak with [name] about re-engaging with [facility] services. She was going over discharge paperwork at the time. [place] will follow up on referral post discharge for medication management. Discussed referral with [place].    Marthenia Rolling, MSN-Ed, RN,BSN [facility] Liaison [phone]

## 2016-05-02 NOTE — Discharge Summary (Signed)
[name] MRN: [id number] DOB/AGE: December 06, 1990 25 y.o.  Admit date: 04/24/2016 Discharge date: 05/02/2016  Primary Care Physician:  Angelica Chessman, MD   Discharge Diagnoses:   Patient Active Problem List   Diagnosis Date Noted  . Major depression, chronic 01/06/2011    Priority: High  . Sickle cell disease ([place]) 01/08/2009    Priority: High  . Acute deep vein thrombosis (DVT) of left upper extremity ([place]) 04/17/2016  . Post partum depression 04/03/2016  . Episode of recurrent major depressive disorder ([place])   . Chronic back pain   . Hyperbilirubinemia 11/09/2015  . Narcotic dependence ([place]) 10/06/2015  . Herpes simplex type 2 (HSV-2) infection  09/26/2015  . Chronic pain 08/04/2015  . Cocaine use 07/24/2015  . Anemia of chronic disease   . Hb-SS disease without crisis ([place]) 02/26/2013  . Migraines 11/08/2011  . GERD (gastroesophageal reflux disease) 02/17/2011  . Trichotillomania 01/08/2009    DISCHARGE MEDICATION: Allergies as of 05/02/2016      Reactions   Food Hives, Other (See Comments)   Pt states that she is allergic to carrots.    Latex Rash      Medication List    TAKE these medications   aspirin-acetaminophen-caffeine 250-250-65 MG tablet Commonly known as:  EXCEDRIN MIGRAINE Take 2 tablets by mouth every 6 (six) hours as needed for headache.   cyclobenzaprine 10 MG tablet Commonly known as:  FLEXERIL Take 1 tablet (10 mg total) by mouth 3 (three) times daily as needed for muscle spasms.   DULoxetine 30 MG capsule Commonly known as:  CYMBALTA Take 1 capsule (30 mg total) by mouth daily. Start taking on:  05/03/2016   hydroxyurea 500 MG capsule Commonly known as:  HYDREA Take 2 capsules (1,000 mg total) by mouth daily. May take with food to minimize GI side effects.   ibuprofen 600 MG tablet Commonly known as:  ADVIL,MOTRIN Take 1 tablet (600 mg total) by mouth every 6 (six) hours as needed for mild pain or moderate pain.   morphine 30  MG 12 hr tablet Commonly known as:  MS CONTIN Take 1 tablet (30 mg total) by mouth every 12 (twelve) hours.   oxyCODONE 15 MG immediate release tablet Commonly known as:  ROXICODONE Take 1 tablet (15 mg total) by mouth every 4 (four) hours as needed for pain.   Rivaroxaban 15 & 20 MG Tbpk Commonly known as:  XARELTO STARTER PACK Take as directed on package: Start with one 15mg  tablet by mouth twice a day with food. On Day 22, switch to one 20mg  tablet once a day with food.   Valacyclovir Commonly known as: VALTREX Take 1 tablet (1000 mg total) by mouth daily       Consults:    SIGNIFICANT DIAGNOSTIC STUDIES:  US Transvaginal Non-ob  Result Date: 04/03/2016 CLINICAL DATA:  Excessive vaginal bleeding. EXAM: TRANSABDOMINAL AND TRANSVAGINAL ULTRASOUND OF PELVIS TECHNIQUE: Both transabdominal and transvaginal ultrasound examinations of the pelvis were performed. Transabdominal technique was performed for global imaging of the pelvis including uterus, ovaries, adnexal regions, and pelvic cul-de-sac. It was necessary to proceed with endovaginal exam following the transabdominal exam to visualize the uterus, ovaries, and adnexa. COMPARISON:  CT of 02/26/2016. FINDINGS: Uterus Measurements: 7.4 x 4.1 x 5.2 cm. No fibroids or other mass visualized. Endometrium Thickness: Normal, 3 mm.  No focal abnormality visualized. Right ovary Measurements: 2.0 x 1.4 x 1.5 cm. Normal in morphology. Left ovary Measurements: 4.0 x 2.4 x 2.2 cm. An anechoic lesion within  measures 2.5 cm and is likely new since the prior CT. An area of curvilinear complexity within or adjacent to this lesion measures on the order of 5 mm. IMPRESSION: 1. No explanation for excessive vaginal bleeding. 2. Left ovarian lesion which is likely a complex dominant follicle or cyst, especially given absence of lesion in this area on 02/26/2016 CT. Given complexity within, consider follow-up with ultrasound at 6-12 weeks. Electronically Signed    By: Abigail Miyamoto M.D.   On: 04/03/2016 15:55   US Pelvis Complete  Result Date: 04/03/2016 CLINICAL DATA:  Excessive vaginal bleeding. EXAM: TRANSABDOMINAL AND TRANSVAGINAL ULTRASOUND OF PELVIS TECHNIQUE: Both transabdominal and transvaginal ultrasound examinations of the pelvis were performed. Transabdominal technique was performed for global imaging of the pelvis including uterus, ovaries, adnexal regions, and pelvic cul-de-sac. It was necessary to proceed with endovaginal exam following the transabdominal exam to visualize the uterus, ovaries, and adnexa. COMPARISON:  CT of 02/26/2016. FINDINGS: Uterus Measurements: 7.4 x 4.1 x 5.2 cm. No fibroids or other mass visualized. Endometrium Thickness: Normal, 3 mm.  No focal abnormality visualized. Right ovary Measurements: 2.0 x 1.4 x 1.5 cm. Normal in morphology. Left ovary Measurements: 4.0 x 2.4 x 2.2 cm. An anechoic lesion within measures 2.5 cm and is likely new since the prior CT. An area of curvilinear complexity within or adjacent to this lesion measures on the order of 5 mm. IMPRESSION: 1. No explanation for excessive vaginal bleeding. 2. Left ovarian lesion which is likely a complex dominant follicle or cyst, especially given absence of lesion in this area on 02/26/2016 CT. Given complexity within, consider follow-up with ultrasound at 6-12 weeks. Electronically Signed   By: Abigail Miyamoto M.D.   On: 04/03/2016 15:55   Dg Humerus Left  Result Date: 04/17/2016 CLINICAL DATA:  Diffuse left arm pain. No known injury. Sickle cell disease. EXAM: LEFT HUMERUS - 2+ VIEW COMPARISON:  Left shoulder dated 05/06/2012 and 12/11/2011 and left humerus dated 03/11/2009. FINDINGS: Patchy sclerosis is again demonstrated in the humeral head. No other abnormalities are seen. 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 No acute abnormality. Electronically Signed   By: Claudie Revering M.D.   On: 04/17/2016 13:19     No results found for this  or any previous visit (from the past 240 hour(s)).  BRIEF ADMITTING H & P: A 25 yo female with known Sickle cell disease here with pain in her left arm, back and legs. Has been having pain since she left the hospital a few days ago. She left prematurely due to family reasons. She has since been feeling worst in the last 2 days and could not control her pain at home. She rates the pain as 10/10. Has received multiple doses of Dilaudid in the ER but not getting better. No fever, no NVD. Her hemoglobin remains stable.   Hospital Course:  Present on Admission: . Sickle cell anemia with crisis ([place]) . Hb-SS disease with crisis ([place]) . Major depression, chronic . Chronic pain  Pt was admitted with sickle cell crisis. She was managed with IV Dilaudid via PCA, clinician assisted doses of Dilaudid and Toradol. As her pain improved, she medications were weaned however she went back into crisis likely due to changes in weather. Treatment was re-escalated and as pain and function improved, medications were weaned. At the time of discharge her she is ambulatory without any difficulty. She still has pain in the LUE at the area of the DVT however it  has not impeded function of her arm. She has an anemia of chronic disease and received a transfusion with Hb of 6.9 g/dLfor reasons that are unclear. Nevertheless, at the time of discharge her Hb was 8.4 g/dL. Patient reports that she ran out of Hydrea and was without it for a week. Hydrea was continued during hospitalization. She is reminded to call ahead for her prescriptions. Of note pt had been having vaginal bleeding in the last month. However  Vaginal bleeding has resolved although patient admits that she did not complete the prescription of Provera. Pt has a h/o chronic constipation but was able to have good BM's with Senna-S. Pt has  H/o major depression and was prescribed Cymbalta which she has not been taking. She has called the Pharmacy and the Prescription has  been prepared and is ready for pick-up. Pt was recently diagnosed with a DVT of the LUE. She was continued on Xarelto during the hospitalization. Today patient also reports that she has Valtrex at home and has been taking it on a daily basis. However she did not report this as a current medication. I have instructed patient to continue the Valtrex at home. Pt has a poor understanding of her medications and health in general. I will ask [facility] to follow up with at home.   Disposition and Follow-up: Pt is discharged home in good condition and is to follow up with her PMD at appointment scheduled for 05/24/2016.    DISCHARGE EXAM:  General: Alert, awake, oriented x3, in no apparent distress.  HEENT: [place]/AT PEERL, EOMI, anicteric Neck: Trachea midline, no masses, no thyromegal,y no JVD, no carotid bruit OROPHARYNX: Moist, No exudate/ erythema/lesions.  Heart: Regular rate and rhythm, without murmurs, rubs, gallops or S3. PMI non-displaced. Exam reveals no decreased pulses. Pulmonary/Chest: Normal effort. Breath sounds normal. No. Apnea. Clear to auscultation,no stridor,  no wheezing and no rhonchi noted. No respiratory distress and no tenderness noted. Abdomen: Soft, nontender, nondistended, normal bowel sounds, no masses no hepatosplenomegaly noted. No fluid wave and no ascites. There is no guarding or rebound. Neuro: Alert and oriented to person, place and time. Normal motor skills, Displays no atrophy or tremors and exhibits normal muscle tone.  No focal neurological deficits noted cranial nerves II through XII grossly intact. No sensory deficit noted. Strength at baseline in bilateral upper and lower extremities. Gait normal. Musculoskeletal: No warmth swelling or erythema around joints, no spinal tenderness noted. Psychiatric: Patient alert and oriented x3, good insight and cognition, good recent to remote recall. Mood, affect and judgement normal Skin: Skin is warm and dry. No bruising, no ecchymosis  and no rash noted. Pt is not diaphoretic. No erythema. No pallor   Blood pressure 120/78, pulse 91, temperature 98.6 F (37 C), temperature source Oral, resp. rate 16, height 5\' 1"  (1.549 m), weight 81.8 kg (180 lb 5.4 oz), last menstrual period 04/10/2016, SpO2 99 %, not currently breastfeeding.   No results for input(s): NA, K, CL, CO2, GLUCOSE, BUN, CREATININE, CALCIUM, MG, PHOS in the last 72 hours. No results for input(s): AST, ALT, ALKPHOS, BILITOT, PROT, ALBUMIN in the last 72 hours. No results for input(s): LIPASE, AMYLASE in the last 72 hours.  Recent Labs  04/30/16 0602 05/01/16 1703  WBC 14.9* 12.4*  NEUTROABS 9.5* 6.9  HGB 6.6* 8.4*  HCT 19.8* 25.0*  MCV 90.0 90.3  PLT 450* 546*     Total time spent including face to face and decision making was greater than 30 minutes  Signed: Melis Trochez A. 05/02/2016, 12:15 PM

## 2016-05-02 NOTE — Care Management Important Message (Signed)
Important Message  Patient Details  Name: [name] MRN: [id number] Date of Birth: Apr 04, 1991   Medicare Important Message Given:  Yes    Kerin Salen 05/02/2016, 11:05 AM

## 2016-05-02 NOTE — Progress Notes (Signed)
Patient discharged to home, all discharge medications and instructions reviewed and questions answered.  Patient to be assisted to vehicle by wheelchair.  

## 2016-05-03 ENCOUNTER — Other Ambulatory Visit: Payer: Self-pay | Admitting: Internal Medicine

## 2016-05-03 ENCOUNTER — Other Ambulatory Visit: Payer: Self-pay | Admitting: *Deleted

## 2016-05-03 DIAGNOSIS — D571 Sickle-cell disease without crisis: Secondary | ICD-10-CM

## 2016-05-03 MED ORDER — MORPHINE SULFATE ER 30 MG PO TBCR: 30.0000 mg | EXTENDED_RELEASE_TABLET | Freq: Two times a day (BID) | ORAL | 0 refills | Status: DC

## 2016-05-03 MED ORDER — CYCLOBENZAPRINE HCL 10 MG PO TABS: 10.0000 mg | ORAL_TABLET | Freq: Three times a day (TID) | ORAL | 0 refills | Status: DC | PRN

## 2016-05-03 MED ORDER — OXYCODONE HCL 15 MG PO TABS: 15.0000 mg | ORAL_TABLET | ORAL | 0 refills | Status: DC | PRN

## 2016-05-03 NOTE — Patient Outreach (Signed)
[facility]) Care Management  05/03/2016  [name] 1991/01/23 [id number]   Member has been difficult to reach for the past several months (since March 2017), recently discussed with leadership during difficult case discussion, and case was closed as member was not ready for engagement.  New referral received from attending MD while hospitalize, discharged on yesterday, 12/18.  Call placed today to discuss engagement with [facility], no answer.  HIPAA compliant voice message left.  Will await call back.  Will make 2nd attempt to contact again tomorrow and discuss again during difficult case discussion this week.  Valente David, [place], MSN [place] [phone]

## 2016-05-04 ENCOUNTER — Other Ambulatory Visit: Payer: Self-pay | Admitting: *Deleted

## 2016-05-04 NOTE — Patient Outreach (Signed)
[facility]) Care Management  05/04/2016  [name] 09-15-1990 [id number]   2nd attempt made to contact member, unsuccessful.  HIPAA compliant voice message left.  Will await call back, if no call back will follow up tomorrow.  Valente David, [place], MSN [place] [phone]

## 2016-05-05 ENCOUNTER — Encounter: Payer: Self-pay | Admitting: *Deleted

## 2016-05-05 ENCOUNTER — Other Ambulatory Visit: Payer: Self-pay | Admitting: *Deleted

## 2016-05-05 NOTE — Patient Outreach (Signed)
[facility]) Care Management  05/05/2016  NORRENE WHALLEY April 02, 1991 [id number]   3rd unsuccessful attempt to contact member after most recent discharge.  HIPAA compliant voice message left.  Will await call back.  Will send outreach letter.  If no response in 10 days, will close case.  Valente David, [place], MSN [place] [phone]

## 2016-05-06 ENCOUNTER — Encounter (HOSPITAL_COMMUNITY): Payer: Self-pay | Admitting: Emergency Medicine

## 2016-05-06 ENCOUNTER — Emergency Department (HOSPITAL_COMMUNITY)
Admission: EM | Admit: 2016-05-06 | Discharge: 2016-05-06 | Disposition: A | Discharge status: Home / Self Care | Payer: Medicare Other | Attending: Emergency Medicine | Admitting: Emergency Medicine

## 2016-05-06 ENCOUNTER — Ambulatory Visit: Payer: Self-pay | Admitting: *Deleted

## 2016-05-06 DIAGNOSIS — Z91040 Latex allergy status: Secondary | ICD-10-CM | POA: Insufficient documentation

## 2016-05-06 DIAGNOSIS — Z7902 Long term (current) use of antithrombotics/antiplatelets: Secondary | ICD-10-CM | POA: Insufficient documentation

## 2016-05-06 DIAGNOSIS — D57219 Sickle-cell/Hb-C disease with crisis, unspecified: Secondary | ICD-10-CM | POA: Diagnosis not present

## 2016-05-06 DIAGNOSIS — Z87891 Personal history of nicotine dependence: Secondary | ICD-10-CM | POA: Insufficient documentation

## 2016-05-06 DIAGNOSIS — D5700 Hb-SS disease with crisis, unspecified: Secondary | ICD-10-CM | POA: Diagnosis present

## 2016-05-06 DIAGNOSIS — Z79899 Other long term (current) drug therapy: Secondary | ICD-10-CM | POA: Insufficient documentation

## 2016-05-06 DIAGNOSIS — D571 Sickle-cell disease without crisis: Secondary | ICD-10-CM | POA: Diagnosis not present

## 2016-05-06 DIAGNOSIS — Z7982 Long term (current) use of aspirin: Secondary | ICD-10-CM | POA: Diagnosis not present

## 2016-05-06 LAB — CBC WITH DIFFERENTIAL/PLATELET
BASOS ABS: 0 10*3/uL (ref 0.0–0.1)
BASOS PCT: 0 %
Eosinophils Absolute: 0.1 10*3/uL (ref 0.0–0.7)
Eosinophils Relative: 1 %
HEMATOCRIT: 28.3 % — AB (ref 36.0–46.0)
HEMOGLOBIN: 9.6 g/dL — AB (ref 12.0–15.0)
Lymphocytes Relative: 12 %
Lymphs Abs: 1.1 10*3/uL (ref 0.7–4.0)
MCH: 30.1 pg (ref 26.0–34.0)
MCHC: 33.9 g/dL (ref 30.0–36.0)
MCV: 88.7 fL (ref 78.0–100.0)
MONOS PCT: 9 %
Monocytes Absolute: 0.8 10*3/uL (ref 0.1–1.0)
NEUTROS ABS: 7.3 10*3/uL (ref 1.7–7.7)
NEUTROS PCT: 78 %
Platelets: 546 10*3/uL — ABNORMAL HIGH (ref 150–400)
RBC: 3.19 MIL/uL — AB (ref 3.87–5.11)
RDW: 18.3 % — ABNORMAL HIGH (ref 11.5–15.5)
WBC: 9.3 10*3/uL (ref 4.0–10.5)

## 2016-05-06 LAB — COMPREHENSIVE METABOLIC PANEL
ALK PHOS: 57 U/L (ref 38–126)
ALT: 21 U/L (ref 14–54)
ANION GAP: 9 (ref 5–15)
AST: 21 U/L (ref 15–41)
Albumin: 4.5 g/dL (ref 3.5–5.0)
BUN: 6 mg/dL (ref 6–20)
CALCIUM: 9.2 mg/dL (ref 8.9–10.3)
CHLORIDE: 105 mmol/L (ref 101–111)
CO2: 24 mmol/L (ref 22–32)
Creatinine, Ser: 0.63 mg/dL (ref 0.44–1.00)
GFR calc non Af Amer: >60 mL/min (ref >60)
Glucose, Bld: 103 mg/dL — ABNORMAL HIGH (ref 65–99)
Potassium: 3.5 mmol/L (ref 3.5–5.1)
SODIUM: 138 mmol/L (ref 135–145)
Total Bilirubin: 1.9 mg/dL — ABNORMAL HIGH (ref 0.3–1.2)
Total Protein: 7.8 g/dL (ref 6.5–8.1)

## 2016-05-06 LAB — RETICULOCYTES
RBC.: 3.19 MIL/uL — AB (ref 3.87–5.11)
RETIC COUNT ABSOLUTE: 350.9 10*3/uL — AB (ref 19.0–186.0)
Retic Ct Pct: 11 % — ABNORMAL HIGH (ref 0.4–3.1)

## 2016-05-06 MED ORDER — DIPHENHYDRAMINE HCL 50 MG/ML IJ SOLN
25.0000 mg | Freq: Once | INTRAMUSCULAR | Status: AC
  Administered 2016-05-06: 25 mg via INTRAVENOUS
  Filled 2016-05-06: qty 1

## 2016-05-06 MED ORDER — KETOROLAC TROMETHAMINE 30 MG/ML IJ SOLN
30.0000 mg | Freq: Once | INTRAMUSCULAR | Status: AC
  Administered 2016-05-06: 30 mg via INTRAVENOUS
  Filled 2016-05-06: qty 1

## 2016-05-06 MED ORDER — SODIUM CHLORIDE 0.9 % IV BOLUS (SEPSIS)
1000.0000 mL | Freq: Once | INTRAVENOUS | Status: AC
  Administered 2016-05-06: 1000 mL via INTRAVENOUS

## 2016-05-06 MED ORDER — HYDROMORPHONE HCL 2 MG/ML IJ SOLN
2.0000 mg | Freq: Once | INTRAMUSCULAR | Status: AC
  Administered 2016-05-06: 2 mg via INTRAVENOUS
  Filled 2016-05-06: qty 1

## 2016-05-06 MED ORDER — ONDANSETRON HCL 4 MG/2ML IJ SOLN: 4.0000 mg | Freq: Once | INTRAMUSCULAR | Status: DC

## 2016-05-06 MED ORDER — HYDROMORPHONE HCL 2 MG/ML IJ SOLN
0.5000 mg | Freq: Once | INTRAMUSCULAR | Status: AC
  Administered 2016-05-06: 0.5 mg via SUBCUTANEOUS
  Filled 2016-05-06: qty 1

## 2016-05-06 MED ORDER — HYDROMORPHONE HCL 2 MG/ML IJ SOLN
1.0000 mg | Freq: Once | INTRAMUSCULAR | Status: AC
  Administered 2016-05-06: 1 mg via INTRAVENOUS
  Filled 2016-05-06: qty 1

## 2016-05-06 MED ORDER — ONDANSETRON HCL 4 MG/2ML IJ SOLN
4.0000 mg | Freq: Once | INTRAMUSCULAR | Status: AC
  Administered 2016-05-06: 4 mg via INTRAVENOUS
  Filled 2016-05-06: qty 2

## 2016-05-06 MED ORDER — HEPARIN SOD (PORK) LOCK FLUSH 100 UNIT/ML IV SOLN
500.0000 [IU] | Freq: Once | INTRAVENOUS | Status: AC
  Administered 2016-05-06: 500 [IU]
  Filled 2016-05-06: qty 5

## 2016-05-06 NOTE — ED Notes (Signed)
Pt given apple juice  

## 2016-05-06 NOTE — ED Notes (Signed)
Pt complaint of worsening left arm pain since discharge for blood clot in left arm.

## 2016-05-06 NOTE — ED Notes (Signed)
Graham crackers and ice water given to pt.

## 2016-05-06 NOTE — ED Triage Notes (Signed)
Pt reports continued arm pain from blood clot since discharge from hospital on Tuesday. Hx of sickle cell.

## 2016-05-06 NOTE — Discharge Instructions (Signed)
Please maintain your outpatient appointment with your provider. If you experience any worsening of symptom including, increased pain, swelling, chest pain, shortness of breath or any concerning symptoms return to the emergency department.

## 2016-05-06 NOTE — ED Notes (Signed)
Awaiting channel for pump prior to start of bolus. Pt given ham sandwich, vanilla pudding, and ice water.

## 2016-05-06 NOTE — ED Provider Notes (Signed)
[facility] DEPT Provider Note   CSN: [id number] Arrival date & time: 05/06/16  0957     History   Chief Complaint Chief Complaint  Patient presents with  . Sickle Cell Pain Crisis  . Arm Pain    HPI [name] is a 25 y.o. female history of sickle cell anemia presenting to the emergency department with worsening left arm pain. She was recently discharged on 05/02/2016 for sickle cell crisis of her left arm. Her pain had been well controlled with oxycodone since then. She woke up early this morning with pain that was not controlled with oxycodone. She states that her pain is 9 out of 10 and was better when she sleeps worse when she moves or attempts to lift her son. She denies any chest pain, shortness of breath, nausea, vomiting, dizziness, or any other symptoms at this time. She is hoping to get pain control and go home.  HPI  Past Medical History:  Diagnosis Date  . Blood transfusion    "lots"  . Blood transfusion without reported diagnosis   . Chronic back pain    "very severe; have knot in my back; from tight muscle; take RX and exercise for it"  . Depression 01/06/2011  . Exertional dyspnea    "sometimes"  . Genital HSV   . GERD (gastroesophageal reflux disease) 02/17/2011  . Migraines 11/08/11   "@ least twice/month"  . 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  . Mood swings ([place]) 11/08/11   "I go back and forth; real bad"  . Sickle cell anemia ([name])   . Sickle cell anemia with crisis ([place])   . Trichotillomania    h/o    Patient Active Problem List   Diagnosis Date Noted  . Acute deep vein thrombosis (DVT) of left upper extremity ([place]) 04/17/2016  . Post partum depression 04/03/2016  . Episode of recurrent major depressive disorder ([place])   . Sickle cell anemia with pain ([place]) 02/16/2016  . Thrombocytosis ([place]) 11/09/2015  . Hyperbilirubinemia 11/09/2015  . Pyrexia   . Narcotic dependence ([place]) 10/06/2015  . 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 High risk for intrapartum complications, antepartum 08/12/2015  . Chronic pain 08/04/2015  . Cocaine use 07/24/2015  . Herpes simplex 07/14/2015  . Anemia of chronic disease   . 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 Sickle cell disease ([place]) 01/08/2009  . 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 CHOLECYSTECTOMY  05/2010  . DILATION AND CURETTAGE OF UTERUS  02/20/11   S/P miscarriage  . IR GENERIC HISTORICAL  12/23/2015   IR FLUORO GUIDE CV LINE RIGHT 12/23/2015 Jacqulynn Cadet, MD [facility]  . IR GENERIC HISTORICAL  12/23/2015   IR US GUIDE VASC ACCESS RIGHT 12/23/2015 Jac[facility]lynn Cadet, MD [facility]    OB History    Gravida Para Term Preterm AB Living   3 2 2   1 2    SAB TAB Ectopic Multiple Live Births   1     0 2      Obstetric Comments   Miscarried in October 2012 at about 7 weeks       Home Medications    Prior to Admission medications   Medication Sig Start Date End Date Taking? Authorizing Provider  aspirin-acetaminophen-caffeine (EXCEDRIN MIGRAINE) [phone] MG tablet Take 2 tablets by mouth every 6 (six) hours as needed for headache.    Yes Historical  Provider, MD  hydroxyurea (HYDREA) 500 MG capsule Take 2 capsules (1,000 mg total) by mouth daily. May take with food to minimize GI side effects. 04/18/16  Yes Tresa Garter, MD  ibuprofen (ADVIL,MOTRIN) 600 MG tablet Take 1 tablet (600 mg total) by mouth every 6 (six) hours as needed for mild pain or moderate pain. 03/14/16  Yes Olugbemiga Essie Christine, MD  morphine (MS CONTIN) 30 MG 12 hr tablet Take 1 tablet (30 mg total) by mouth every 12 (twelve) hours. 05/03/16  Yes Olugbemiga Essie Christine, MD  oxyCODONE (ROXICODONE) 15 MG immediate release tablet Take 1 tablet (15 mg total) by mouth every 4 (four) hours as needed for pain. 05/03/16 05/18/16 Yes Tresa Garter, MD  Rivaroxaban (XARELTO STARTER  PACK) 15 & 20 MG TBPK Take as directed on package: Start with one 15mg  tablet by mouth twice a day with food. On Day 22, switch to one 20mg  tablet once a day with food. 04/20/16  Yes [name], MD  valACYclovir (VALTREX) 1000 MG tablet Take 1 tablet (1,000 mg total) by mouth daily. 05/02/16  Yes [name], MD  cyclobenzaprine (FLEXERIL) 10 MG tablet Take 1 tablet (10 mg total) by mouth 3 (three) times daily as needed for muscle spasms. Patient not taking: Reported on 05/06/2016 05/03/16   Tresa Garter, MD  DULoxetine (CYMBALTA) 30 MG capsule Take 1 capsule (30 mg total) by mouth daily. Patient not taking: Reported on 05/06/2016 05/03/16   [name], MD    Family History Family History  Problem Relation Age of Onset  . Sickle cell trait Mother   . Sickle cell trait Father   . Diabetes Maternal Grandmother   . Diabetes Paternal Grandmother   . Hypertension Paternal Grandmother   . Diabetes Maternal Grandfather     Social History Social History  Substance Use Topics  . Smoking status: Former Smoker    Packs/day: 0.25    Years: 1.00    Types: Cigarettes    Quit date: 03/25/2013  . Smokeless tobacco: Never Used  . Alcohol use No     Comment: pt states she quit marijuan in May 2013. Rare ETOH, + cigarettes.  She is enrolled in school     Allergies   Food and Latex   Review of Systems Review of Systems  Constitutional: Negative for chills, diaphoresis and fever.  HENT: Negative for ear pain and sore throat.   Eyes: Negative for pain and visual disturbance.  Respiratory: Negative for cough, choking, chest tightness, shortness of breath, wheezing and stridor.   Cardiovascular: Negative for chest pain, palpitations and leg swelling.  Gastrointestinal: Negative for abdominal pain, nausea and vomiting.       She experienced nausea and vomiting yesterday but states that she had not eaten all day. She denies nausea or vomiting since the pain began  this morning.  Genitourinary: Negative for dysuria and hematuria.  Musculoskeletal: Positive for arthralgias and back pain. Negative for neck pain and neck stiffness.       Patient reports chronic back pain. Shoulder and elbow pain on the left  Skin: Negative for color change, pallor, rash and wound.  Neurological: Negative for dizziness, tremors, seizures, syncope, weakness, light-headedness, numbness and headaches.  All other systems reviewed and are negative.    Physical Exam Updated Vital Signs BP 97/61 (BP Location: Right Arm)   Pulse 104   Temp 98.3 F (36.8 C) (Oral)   Resp 18   Ht 5\' 1"  (1.549  m)   Wt 79.4 kg   LMP 04/10/2016   SpO2 95%   BMI 33.07 kg/m   Physical Exam  Constitutional: She appears well-developed and well-nourished. No distress.  Afebrile, non-toxic appearing lying in bed in mild discomfort, no acute distress  HENT:  Head: Normocephalic and atraumatic.  Eyes: Conjunctivae and EOM are normal. Pupils are equal, round, and reactive to light. Right eye exhibits no discharge. Left eye exhibits no discharge. No scleral icterus.  Neck: Normal range of motion. Neck supple.  Cardiovascular: Normal rate, regular rhythm, normal heart sounds and intact distal pulses.   No murmur heard. Pulmonary/Chest: Effort normal and breath sounds normal. No respiratory distress. She has no wheezes. She has no rales. She exhibits no tenderness.  Abdominal: Soft. She exhibits no distension. There is no tenderness.  Musculoskeletal: She exhibits no edema or deformity.  patient exhibits tenderness to light touch of her left arm from the shoulder to elbow  Neurological: She is alert. No sensory deficit.  Skin: Skin is warm and dry. Capillary refill takes less than 2 seconds. No rash noted. She is not diaphoretic. No erythema. No pallor.  Psychiatric: She has a normal mood and affect. Her behavior is normal.  Nursing note and vitals reviewed.    ED Treatments / Results   Labs (all labs ordered are listed, but only abnormal results are displayed) Labs Reviewed  COMPREHENSIVE METABOLIC PANEL - Abnormal; Notable for the following:       Result Value   Glucose, Bld 103 (*)    Total Bilirubin 1.9 (*)    All other components within normal limits  CBC WITH DIFFERENTIAL/PLATELET - Abnormal; Notable for the following:    RBC 3.19 (*)    Hemoglobin 9.6 (*)    HCT 28.3 (*)    RDW 18.3 (*)    Platelets 546 (*)    All other components within normal limits  RETICULOCYTES - Abnormal; Notable for the following:    Retic Ct Pct 11.0 (*)    RBC. 3.19 (*)    Retic Count, Manual 350.9 (*)    All other components within normal limits    EKG  EKG Interpretation  Date/Time:  Friday May 06 2016 10:19:02 EST Ventricular Rate:  96 PR Interval:    QRS Duration: 82 QT Interval:  367 QTC Calculation: 464 R Axis:   73 Text Interpretation:  Sinus rhythm non specific t wave changes resolved since last tracing Confirmed by KNAPP  MD-J, JON [id number]) on 05/06/2016 11:03:45 AM       Radiology No results found.  Procedures Procedures (including critical care time)  Medications Ordered in ED Medications  HYDROmorphone (DILAUDID) injection 0.5 mg (0.5 mg Subcutaneous Given 05/06/16 1103)  ondansetron (ZOFRAN) injection 4 mg (4 mg Intravenous Given 05/06/16 1204)  HYDROmorphone (DILAUDID) injection 2 mg (2 mg Intravenous Given 05/06/16 1204)  sodium chloride 0.9 % bolus 1,000 mL (0 mLs Intravenous Stopped 05/06/16 1349)  ketorolac (TORADOL) 30 MG/ML injection 30 mg (30 mg Intravenous Given 05/06/16 1204)  diphenhydrAMINE (BENADRYL) injection 25 mg (25 mg Intravenous Given 05/06/16 1204)  ondansetron (ZOFRAN) injection 4 mg (4 mg Intravenous Given 05/06/16 1311)  HYDROmorphone (DILAUDID) injection 2 mg (2 mg Intravenous Given 05/06/16 1311)  diphenhydrAMINE (BENADRYL) injection 25 mg (25 mg Intravenous Given 05/06/16 1311)  HYDROmorphone (DILAUDID) injection 1 mg  (1 mg Intravenous Given 05/06/16 1424)     Initial Impression / Assessment and Plan / ED Course  I have reviewed the triage vital signs  and the nursing notes.  Pertinent labs & imaging results that were available during my care of the patient were reviewed by me and considered in my medical decision making (see chart for details).  Clinical Course    25 y/o recently discharged for sickle cell crisis of left arm and currently on xarelto for DVT of left arm. Presenting with poorly controlled pain in her left arm since this morning. No relief with oxycodone. Patient is in no acute distress, no sob or c/p.  Administered fluids and analgesia and observed.   12:45: patient asked nurse for more pain medication. Her pain is down to 8/10 12:50: reassessed her and she was resting comfortably. Stated that she felt better but still had 7/10 pain. Repeated doses per Dr. Roderic Palau. She has eaten and feels better. 1:50 reassessed and she states that she feels better but is still in 7/10 pain. Gave 1mg  dilaudid and she states she will be ready to go home. She was in no acute distress and stable to go home. She states that her mother will be driving her and she needs to get to her son. She asked to wait 30 minutes and get some more crackers and water before she goes. Patient was well-appearing and resting comfortably while in the ED.  Discussed strict return precautions. Patient was advised to return to the emergency department if experiencing any worsening of symptoms. Patient understood instructions and agreed with discharge plan.  Patient was discussed with Dr. Roderic Palau who also has seen patient and agrees with assessment and plan.  Final Clinical Impressions(s) / ED Diagnoses   Final diagnoses:  Sickle cell anemia with pain [facility])    New Prescriptions New Prescriptions   No medications on file     Dossie Der 05/06/16 1458    Milton Ferguson, MD 05/07/16 [phone]

## 2016-05-06 NOTE — ED Notes (Signed)
Provider notified of last collected VS. Verbal to continue to discharge.

## 2016-05-11 ENCOUNTER — Telehealth: Payer: Self-pay

## 2016-05-13 ENCOUNTER — Other Ambulatory Visit: Payer: Self-pay | Admitting: Internal Medicine

## 2016-05-13 DIAGNOSIS — D571 Sickle-cell disease without crisis: Secondary | ICD-10-CM

## 2016-05-13 MED ORDER — OXYCODONE HCL 15 MG PO TABS: 15.0000 mg | ORAL_TABLET | ORAL | 0 refills | Status: DC | PRN

## 2016-05-14 IMAGING — US IR FLUORO GUIDE CV LINE*L*
1 series · 1 of 1 positions shown · non-contrast
Comparison: none

INDICATION: 24-year-old with sickle cell crisis and needs central venous access.
The patient is pregnant. Patient's abdomen was shielded for this
procedure.

[Series 1: ir fluoro/shunt/fist · 1 of 1 slices shown]
[im 1/1]
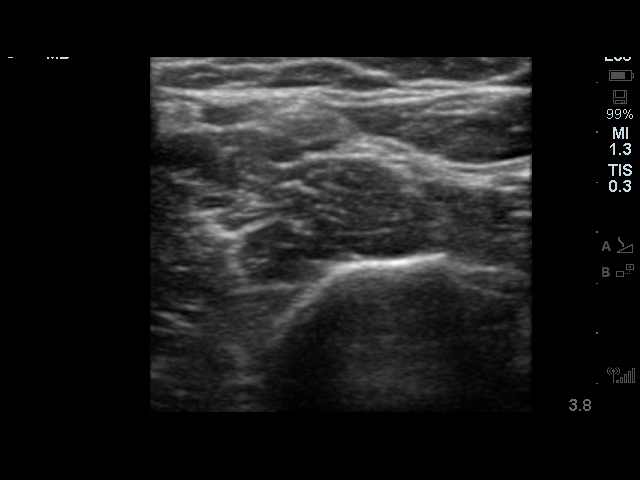

[1 of 1 positions shown; findings below may reference images not displayed]

EXAM:
PICC LINE PLACEMENT WITH ULTRASOUND AND FLUOROSCOPIC GUIDANCE

MEDICATIONS:
None

ANESTHESIA/SEDATION:
None

FLUOROSCOPY TIME:  Fluoroscopy Time: 6 seconds.  0.6 mGy

COMPLICATIONS:
None immediate.

PROCEDURE:
The patient was advised of the possible risks and complications and
agreed to undergo the procedure. In particular, the risks of
radiation exposure and pregnancy were discussed the patient. The
patient was then brought to the angiographic suite for the
procedure.

The right arm was prepped with chlorhexidine, draped in the usual
sterile fashion using maximum barrier technique (cap and mask,
sterile gown, sterile gloves, large sterile sheet, hand hygiene and
cutaneous antiseptic). Local anesthesia was attained by infiltration
with 1% lidocaine. Complex venous anatomy in the right upper arm.
Patient may have an early radial artery takeoff. Attempted to
cannulate multiple veins in the right upper arm with ultrasound
guidance but these attempts were unsuccessful. Therefore, attention
was directed to the left arm. Left arm was prepped and draped in
sterile fashion.

Ultrasound demonstrated patency of the left brachial vein, and this
was documented with an image. Under real-time ultrasound guidance,
this vein was accessed with a 21 gauge micropuncture needle and
image documentation was performed. The needle was exchanged over a
guidewire for a peel-away sheath through which a 43 cm 5 French dual
lumen power injectable PICC was advanced, and positioned with its
tip at the lower SVC/right atrial junction. Fluoroscopy during the
procedure and fluoro spot radiograph confirms appropriate catheter
position. The catheter was flushed, secured to the skin, and covered
with a sterile dressing.
IMPRESSION: Successful placement of a left arm PICC with sonographic and
fluoroscopic guidance. The catheter is ready for use.

## 2016-05-17 ENCOUNTER — Encounter: Payer: Self-pay | Admitting: Family Medicine

## 2016-05-19 ENCOUNTER — Encounter (HOSPITAL_COMMUNITY): Payer: Self-pay | Admitting: Emergency Medicine

## 2016-05-19 ENCOUNTER — Inpatient Hospital Stay (HOSPITAL_COMMUNITY): Payer: Medicare Other

## 2016-05-19 ENCOUNTER — Other Ambulatory Visit: Payer: Self-pay | Admitting: *Deleted

## 2016-05-19 ENCOUNTER — Encounter: Payer: Self-pay | Admitting: *Deleted

## 2016-05-19 ENCOUNTER — Inpatient Hospital Stay (HOSPITAL_COMMUNITY)
Admission: EM | Admit: 2016-05-19 | Discharge: 2016-05-25 | DRG: 812 | Disposition: A | Discharge status: Home / Self Care | Payer: Medicare Other | Attending: Internal Medicine | Admitting: Internal Medicine

## 2016-05-19 DIAGNOSIS — Z7901 Long term (current) use of anticoagulants: Secondary | ICD-10-CM

## 2016-05-19 DIAGNOSIS — F329 Major depressive disorder, single episode, unspecified: Secondary | ICD-10-CM | POA: Diagnosis present

## 2016-05-19 DIAGNOSIS — Z86718 Personal history of other venous thrombosis and embolism: Secondary | ICD-10-CM

## 2016-05-19 DIAGNOSIS — Z79899 Other long term (current) drug therapy: Secondary | ICD-10-CM | POA: Diagnosis not present

## 2016-05-19 DIAGNOSIS — R0602 Shortness of breath: Secondary | ICD-10-CM | POA: Diagnosis not present

## 2016-05-19 DIAGNOSIS — G8929 Other chronic pain: Secondary | ICD-10-CM | POA: Diagnosis present

## 2016-05-19 DIAGNOSIS — F1490 Cocaine use, unspecified, uncomplicated: Secondary | ICD-10-CM | POA: Diagnosis present

## 2016-05-19 DIAGNOSIS — D5700 Hb-SS disease with crisis, unspecified: Principal | ICD-10-CM | POA: Diagnosis present

## 2016-05-19 DIAGNOSIS — D638 Anemia in other chronic diseases classified elsewhere: Secondary | ICD-10-CM | POA: Diagnosis present

## 2016-05-19 DIAGNOSIS — Z87891 Personal history of nicotine dependence: Secondary | ICD-10-CM | POA: Diagnosis not present

## 2016-05-19 DIAGNOSIS — Z91018 Allergy to other foods: Secondary | ICD-10-CM | POA: Diagnosis not present

## 2016-05-19 DIAGNOSIS — R52 Pain, unspecified: Secondary | ICD-10-CM

## 2016-05-19 DIAGNOSIS — F1410 Cocaine abuse, uncomplicated: Secondary | ICD-10-CM | POA: Diagnosis not present

## 2016-05-19 DIAGNOSIS — Z91040 Latex allergy status: Secondary | ICD-10-CM | POA: Diagnosis not present

## 2016-05-19 DIAGNOSIS — R079 Chest pain, unspecified: Secondary | ICD-10-CM | POA: Diagnosis not present

## 2016-05-19 DIAGNOSIS — A6000 Herpesviral infection of urogenital system, unspecified: Secondary | ICD-10-CM | POA: Diagnosis present

## 2016-05-19 DIAGNOSIS — D571 Sickle-cell disease without crisis: Secondary | ICD-10-CM | POA: Diagnosis present

## 2016-05-19 DIAGNOSIS — K219 Gastro-esophageal reflux disease without esophagitis: Secondary | ICD-10-CM | POA: Diagnosis present

## 2016-05-19 DIAGNOSIS — G894 Chronic pain syndrome: Secondary | ICD-10-CM | POA: Diagnosis present

## 2016-05-19 DIAGNOSIS — M549 Dorsalgia, unspecified: Secondary | ICD-10-CM | POA: Diagnosis present

## 2016-05-19 LAB — CBC WITH DIFFERENTIAL/PLATELET
BASOS PCT: 0 %
Basophils Absolute: 0 10*3/uL (ref 0.0–0.1)
EOS ABS: 0.4 10*3/uL (ref 0.0–0.7)
Eosinophils Relative: 4 %
HCT: 27.9 % — ABNORMAL LOW (ref 36.0–46.0)
Hemoglobin: 9.7 g/dL — ABNORMAL LOW (ref 12.0–15.0)
Lymphocytes Relative: 18 %
Lymphs Abs: 2.1 10*3/uL (ref 0.7–4.0)
MCH: 29.2 pg (ref 26.0–34.0)
MCHC: 34.8 g/dL (ref 30.0–36.0)
MCV: 84 fL (ref 78.0–100.0)
MONO ABS: 1.5 10*3/uL — AB (ref 0.1–1.0)
MONOS PCT: 13 %
NEUTROS ABS: 7.5 10*3/uL (ref 1.7–7.7)
Neutrophils Relative %: 65 %
PLATELETS: 537 10*3/uL — AB (ref 150–400)
RBC: 3.32 MIL/uL — ABNORMAL LOW (ref 3.87–5.11)
RDW: 16.2 % — AB (ref 11.5–15.5)
WBC: 11.6 10*3/uL — ABNORMAL HIGH (ref 4.0–10.5)

## 2016-05-19 LAB — COMPREHENSIVE METABOLIC PANEL
ALBUMIN: 4.6 g/dL (ref 3.5–5.0)
ALK PHOS: 58 U/L (ref 38–126)
ALT: 12 U/L — AB (ref 14–54)
ANION GAP: 8 (ref 5–15)
AST: 14 U/L — ABNORMAL LOW (ref 15–41)
BILIRUBIN TOTAL: 3.1 mg/dL — AB (ref 0.3–1.2)
BUN: 5 mg/dL — ABNORMAL LOW (ref 6–20)
CALCIUM: 8.8 mg/dL — AB (ref 8.9–10.3)
CO2: 24 mmol/L (ref 22–32)
CREATININE: 0.58 mg/dL (ref 0.44–1.00)
Chloride: 105 mmol/L (ref 101–111)
GFR calc non Af Amer: >60 mL/min (ref >60)
GLUCOSE: 96 mg/dL (ref 65–99)
Potassium: 3.1 mmol/L — ABNORMAL LOW (ref 3.5–5.1)
SODIUM: 137 mmol/L (ref 135–145)
TOTAL PROTEIN: 6.9 g/dL (ref 6.5–8.1)

## 2016-05-19 LAB — RETICULOCYTES
RBC.: 3.32 MIL/uL — AB (ref 3.87–5.11)
RETIC COUNT ABSOLUTE: 272.2 10*3/uL — AB (ref 19.0–186.0)
RETIC CT PCT: 8.2 % — AB (ref 0.4–3.1)

## 2016-05-19 MED ORDER — HYDROMORPHONE HCL 2 MG/ML IJ SOLN: 2.0000 mg | INTRAMUSCULAR | Status: DC

## 2016-05-19 MED ORDER — POLYETHYLENE GLYCOL 3350 17 G PO PACK
17.0000 g | PACK | Freq: Every day | ORAL | Status: DC | PRN
  Administered 2016-05-24: 17 g via ORAL
  Filled 2016-05-19: qty 1

## 2016-05-19 MED ORDER — DULOXETINE HCL 30 MG PO CPEP
30.0000 mg | ORAL_CAPSULE | Freq: Every day | ORAL | Status: DC
  Administered 2016-05-20 – 2016-05-25 (×6): 30 mg via ORAL
  Filled 2016-05-19 (×6): qty 1

## 2016-05-19 MED ORDER — HYDROMORPHONE HCL 2 MG/ML IJ SOLN
2.0000 mg | INTRAMUSCULAR | Status: AC
Start: 2016-05-19 — End: 2016-05-19
  Administered 2016-05-19: 2 mg via INTRAVENOUS
  Filled 2016-05-19: qty 1

## 2016-05-19 MED ORDER — ONDANSETRON HCL 4 MG/2ML IJ SOLN: 4.0000 mg | Freq: Four times a day (QID) | INTRAMUSCULAR | Status: DC | PRN

## 2016-05-19 MED ORDER — DIPHENHYDRAMINE HCL 50 MG/ML IJ SOLN: 12.5000 mg | Freq: Four times a day (QID) | INTRAMUSCULAR | Status: DC | PRN

## 2016-05-19 MED ORDER — RIVAROXABAN 20 MG PO TABS
20.0000 mg | ORAL_TABLET | Freq: Every day | ORAL | Status: DC
  Administered 2016-05-19 – 2016-05-25 (×7): 20 mg via ORAL
  Filled 2016-05-19 (×7): qty 1

## 2016-05-19 MED ORDER — DEXTROSE-NACL 5-0.45 % IV SOLN
INTRAVENOUS | Status: DC
  Administered 2016-05-19: 15:00:00 via INTRAVENOUS

## 2016-05-19 MED ORDER — HYDROMORPHONE HCL 2 MG/ML IJ SOLN: 2.0000 mg | INTRAMUSCULAR | Status: AC

## 2016-05-19 MED ORDER — HYDROXYUREA 500 MG PO CAPS
1000.0000 mg | ORAL_CAPSULE | Freq: Every day | ORAL | Status: DC
Start: 2016-05-19 — End: 2016-05-25
  Administered 2016-05-19 – 2016-05-25 (×7): 1000 mg via ORAL
  Filled 2016-05-19 (×7): qty 2

## 2016-05-19 MED ORDER — DIPHENHYDRAMINE HCL 50 MG/ML IJ SOLN
25.0000 mg | Freq: Once | INTRAMUSCULAR | Status: AC
  Administered 2016-05-19: 25 mg via INTRAVENOUS
  Filled 2016-05-19: qty 1

## 2016-05-19 MED ORDER — SENNOSIDES-DOCUSATE SODIUM 8.6-50 MG PO TABS
1.0000 | ORAL_TABLET | Freq: Two times a day (BID) | ORAL | Status: DC
  Administered 2016-05-19 – 2016-05-25 (×12): 1 via ORAL
  Filled 2016-05-19 (×12): qty 1

## 2016-05-19 MED ORDER — ASPIRIN-ACETAMINOPHEN-CAFFEINE 250-250-65 MG PO TABS
2.0000 | ORAL_TABLET | Freq: Four times a day (QID) | ORAL | Status: DC | PRN
  Filled 2016-05-19: qty 2

## 2016-05-19 MED ORDER — DIPHENHYDRAMINE HCL 25 MG PO CAPS
25.0000 mg | ORAL_CAPSULE | ORAL | Status: DC | PRN
  Filled 2016-05-19: qty 1

## 2016-05-19 MED ORDER — KETOROLAC TROMETHAMINE 15 MG/ML IJ SOLN
15.0000 mg | Freq: Four times a day (QID) | INTRAMUSCULAR | Status: DC
  Administered 2016-05-19 – 2016-05-20 (×3): 15 mg via INTRAVENOUS
  Filled 2016-05-19 (×3): qty 1

## 2016-05-19 MED ORDER — DIPHENHYDRAMINE HCL 12.5 MG/5ML PO ELIX: 12.5000 mg | ORAL_SOLUTION | Freq: Four times a day (QID) | ORAL | Status: DC | PRN

## 2016-05-19 MED ORDER — DIPHENHYDRAMINE HCL 50 MG/ML IJ SOLN
25.0000 mg | INTRAMUSCULAR | Status: DC | PRN
  Filled 2016-05-19: qty 0.5

## 2016-05-19 MED ORDER — POTASSIUM CHLORIDE CRYS ER 20 MEQ PO TBCR
40.0000 meq | EXTENDED_RELEASE_TABLET | Freq: Once | ORAL | Status: AC
  Administered 2016-05-19: 40 meq via ORAL
  Filled 2016-05-19: qty 2

## 2016-05-19 MED ORDER — ONDANSETRON HCL 4 MG/2ML IJ SOLN
4.0000 mg | INTRAMUSCULAR | Status: DC | PRN
  Administered 2016-05-19: 4 mg via INTRAVENOUS
  Filled 2016-05-19: qty 2

## 2016-05-19 MED ORDER — KETOROLAC TROMETHAMINE 30 MG/ML IJ SOLN
30.0000 mg | Freq: Once | INTRAMUSCULAR | Status: AC
  Administered 2016-05-19: 30 mg via INTRAVENOUS
  Filled 2016-05-19: qty 1

## 2016-05-19 MED ORDER — SODIUM CHLORIDE 0.9% FLUSH: 9.0000 mL | INTRAVENOUS | Status: DC | PRN

## 2016-05-19 MED ORDER — HYDROMORPHONE HCL 2 MG/ML IJ SOLN
2.0000 mg | INTRAMUSCULAR | Status: AC
  Administered 2016-05-19: 2 mg via INTRAVENOUS
  Filled 2016-05-19: qty 1

## 2016-05-19 MED ORDER — ONDANSETRON HCL 4 MG/2ML IJ SOLN: 4.0000 mg | INTRAMUSCULAR | Status: DC | PRN

## 2016-05-19 MED ORDER — DIPHENHYDRAMINE HCL 25 MG PO CAPS
25.0000 mg | ORAL_CAPSULE | ORAL | Status: DC | PRN
  Administered 2016-05-19: 50 mg via ORAL
  Filled 2016-05-19: qty 2

## 2016-05-19 MED ORDER — HYDROMORPHONE 1 MG/ML IV SOLN
INTRAVENOUS | Status: DC
  Administered 2016-05-19: 25 mg via INTRAVENOUS
  Administered 2016-05-20: 4.6 mg via INTRAVENOUS
  Administered 2016-05-20: 1.54 mg via INTRAVENOUS
  Administered 2016-05-20: 1.2 mg via INTRAVENOUS
  Administered 2016-05-20: 1.5 mL via INTRAVENOUS
  Filled 2016-05-19: qty 25

## 2016-05-19 MED ORDER — DEXTROSE-NACL 5-0.45 % IV SOLN
INTRAVENOUS | Status: DC
  Administered 2016-05-19: 1000 mL via INTRAVENOUS
  Administered 2016-05-19 – 2016-05-21 (×4): via INTRAVENOUS
  Administered 2016-05-21: 1000 mL via INTRAVENOUS
  Administered 2016-05-22: 06:00:00 via INTRAVENOUS
  Administered 2016-05-23: 1000 mL via INTRAVENOUS
  Administered 2016-05-23 – 2016-05-25 (×4): via INTRAVENOUS

## 2016-05-19 MED ORDER — NALOXONE HCL 0.4 MG/ML IJ SOLN: 0.4000 mg | INTRAMUSCULAR | Status: DC | PRN

## 2016-05-19 MED ORDER — ONDANSETRON HCL 4 MG PO TABS
4.0000 mg | ORAL_TABLET | ORAL | Status: DC | PRN
  Administered 2016-05-20 – 2016-05-23 (×3): 4 mg via ORAL
  Filled 2016-05-19 (×3): qty 1

## 2016-05-19 MED ORDER — HYDROMORPHONE HCL 2 MG/ML IJ SOLN
2.0000 mg | INTRAMUSCULAR | Status: AC
  Administered 2016-05-19 – 2016-05-20 (×3): 2 mg via INTRAVENOUS
  Filled 2016-05-19 (×3): qty 1

## 2016-05-19 MED ORDER — MORPHINE SULFATE ER 30 MG PO TBCR
30.0000 mg | EXTENDED_RELEASE_TABLET | Freq: Two times a day (BID) | ORAL | Status: DC
  Administered 2016-05-19 – 2016-05-25 (×12): 30 mg via ORAL
  Filled 2016-05-19 (×12): qty 1

## 2016-05-19 NOTE — ED Provider Notes (Signed)
[facility] DEPT Provider Note   CSN: [id number] Arrival date & time: 05/19/16  1244     History   Chief Complaint Chief Complaint  Patient presents with  . Sickle Cell Pain Crisis  . Back Pain    HPI [name] is a 26 y.o. female.  HPI   Back pain beginning last night. Feels the same as prior sickle cell> Not relieved by home oxycodone. Pain severe, 10/10, located diffusely across lower back. No fevers, no chest pain, no dyspnea. Reports has  Chronic back pain but pain beginning last night feels like her sickle cell. No urinary symptoms, no nausea/no vomiting.   Past Medical History:  Diagnosis Date  . Blood transfusion    "lots"  . Blood transfusion without reported diagnosis   . Chronic back pain    "very severe; have knot in my back; from tight muscle; take RX and exercise for it"  . Depression 01/06/2011  . Exertional dyspnea    "sometimes"  . Genital HSV   . GERD (gastroesophageal reflux disease) 02/17/2011  . Migraines 11/08/11   "@ least twice/month"  . 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  . Mood swings ([place]) 11/08/11   "I go back and forth; real bad"  . Sickle cell anemia ([name])   . Sickle cell anemia with crisis ([place])   . Trichotillomania    h/o    Patient Active Problem List   Diagnosis Date Noted  . Sickle cell anemia ([place]) 05/19/2016  . Vasoocclusive sickle cell crisis ([place]) 05/19/2016  . Acute deep vein thrombosis (DVT) of left upper extremity ([place]) 04/17/2016  . Post partum depression 04/03/2016  . Episode of recurrent major depressive disorder ([place])   . Sickle cell anemia with pain ([place]) 02/16/2016  . Thrombocytosis ([place]) 11/09/2015  . Hyperbilirubinemia 11/09/2015  . Pyrexia   . Narcotic dependence ([place]) 10/06/2015  . 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 High risk for intrapartum complications, antepartum 08/12/2015  . Chronic pain 08/04/2015  . Cocaine use 07/24/2015  . Herpes simplex 07/14/2015  .  Anemia of chronic disease   . Leukocytosis   . 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 Sickle cell disease ([place]) 01/08/2009  . 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 CHOLECYSTECTOMY  05/2010  . DILATION AND CURETTAGE OF UTERUS  02/20/11   S/P miscarriage  . IR GENERIC HISTORICAL  12/23/2015   IR FLUORO GUIDE CV LINE RIGHT 12/23/2015 Jac[facility]nn Cadet, MD [facility]  . IR GENERIC HISTORICAL  12/23/2015   IR US GUIDE VASC ACCESS RIGHT 12/23/2015 Jacqulynn Cadet, MD [facility]    OB History    Gravida Para Term Preterm AB Living   3 2 2   1 2    SAB TAB Ectopic Multiple Live Births   1     0 2      Obstetric Comments   Miscarried in October 2012 at about 7 weeks       Home Medications    Prior to Admission medications   Medication Sig Start Date End Date Taking? Authorizing Provider  aspirin-acetaminophen-caffeine (EXCEDRIN MIGRAINE) [phone] MG tablet Take 2 tablets by mouth every 6 (six) hours as needed for headache.    Yes Historical Provider, MD  cyclobenzaprine (FLEXERIL) 10 MG tablet Take 1 tablet (10 mg total) by mouth 3 (three) times daily as needed for muscle spasms. 05/03/16  Yes Olugbemiga Essie Christine,  MD  hydroxyurea (HYDREA) 500 MG capsule Take 2 capsules (1,000 mg total) by mouth daily. May take with food to minimize GI side effects. 04/18/16  Yes Tresa Garter, MD  ibuprofen (ADVIL,MOTRIN) 600 MG tablet Take 1 tablet (600 mg total) by mouth every 6 (six) hours as needed for mild pain or moderate pain. 03/14/16  Yes Olugbemiga Essie Christine, MD  morphine (MS CONTIN) 30 MG 12 hr tablet Take 1 tablet (30 mg total) by mouth every 12 (twelve) hours. 05/03/16  Yes Olugbemiga Essie Christine, MD  oxyCODONE (ROXICODONE) 15 MG immediate release tablet Take 1 tablet (15 mg total) by mouth every 4 (four) hours as needed for pain. 05/18/16 06/02/16 Yes Tresa Garter, MD  Rivaroxaban (XARELTO  STARTER PACK) 15 & 20 MG TBPK Take as directed on package: Start with one 15mg  tablet by mouth twice a day with food. On Day 22, switch to one 20mg  tablet once a day with food. 04/20/16  Yes [name], MD  valACYclovir (VALTREX) 1000 MG tablet Take 1 tablet (1,000 mg total) by mouth daily. 05/02/16  Yes [name], MD  DULoxetine (CYMBALTA) 30 MG capsule Take 1 capsule (30 mg total) by mouth daily. Patient not taking: Reported on 05/06/2016 05/03/16   [name], MD    Family History Family History  Problem Relation Age of Onset  . Sickle cell trait Mother   . Sickle cell trait Father   . Diabetes Maternal Grandmother   . Diabetes Paternal Grandmother   . Hypertension Paternal Grandmother   . Diabetes Maternal Grandfather     Social History Social History  Substance Use Topics  . Smoking status: Former Smoker    Packs/day: 0.25    Years: 1.00    Types: Cigarettes    Quit date: 03/25/2013  . Smokeless tobacco: Never Used  . Alcohol use No     Comment: pt states she quit marijuan in May 2013. Rare ETOH, + cigarettes.  She is enrolled in school     Allergies   Food and Latex   Review of Systems Review of Systems  Constitutional: Negative for fever.  HENT: Negative for sore throat.   Eyes: Negative for visual disturbance.  Respiratory: Negative for cough and shortness of breath.   Cardiovascular: Negative for chest pain.  Gastrointestinal: Negative for abdominal pain, nausea and vomiting.  Genitourinary: Negative for difficulty urinating.  Musculoskeletal: Positive for back pain. Negative for neck pain.  Skin: Negative for rash.  Neurological: Negative for syncope and headaches.     Physical Exam Updated Vital Signs BP 102/76 (BP Location: Left Arm)   Pulse 87   Temp 98.2 F (36.8 C) (Oral)   Resp 18   SpO2 100%   Physical Exam  Constitutional: She is oriented to person, place, and time. She appears well-developed and well-nourished.  No distress.  HENT:  Head: Normocephalic and atraumatic.  Eyes: Conjunctivae and EOM are normal.  Neck: Normal range of motion.  Cardiovascular: Normal rate, regular rhythm, normal heart sounds and intact distal pulses.  Exam reveals no gallop and no friction rub.   No murmur heard. Pulmonary/Chest: Effort normal and breath sounds normal. No respiratory distress. She has no wheezes. She has no rales.  Abdominal: Soft. She exhibits no distension. There is no tenderness. There is no guarding.  Musculoskeletal: She exhibits no edema.       Lumbar back: She exhibits tenderness and bony tenderness.  Neurological: She is alert and oriented to person,  place, and time.  Skin: Skin is warm and dry. No rash noted. She is not diaphoretic. No erythema.  Nursing note and vitals reviewed.    ED Treatments / Results  Labs (all labs ordered are listed, but only abnormal results are displayed) Labs Reviewed  COMPREHENSIVE METABOLIC PANEL - Abnormal; Notable for the following:       Result Value   Potassium 3.1 (*)    BUN 5 (*)    Calcium 8.8 (*)    AST 14 (*)    ALT 12 (*)    Total Bilirubin 3.1 (*)    All other components within normal limits  CBC WITH DIFFERENTIAL/PLATELET - Abnormal; Notable for the following:    WBC 11.6 (*)    RBC 3.32 (*)    Hemoglobin 9.7 (*)    HCT 27.9 (*)    RDW 16.2 (*)    Platelets 537 (*)    Monocytes Absolute 1.5 (*)    All other components within normal limits  RETICULOCYTES - Abnormal; Notable for the following:    Retic Ct Pct 8.2 (*)    RBC. 3.32 (*)    Retic Count, Manual 272.2 (*)    All other components within normal limits  URINALYSIS, ROUTINE W REFLEX MICROSCOPIC    EKG  EKG Interpretation None       Radiology No results found.  Procedures Procedures (including critical care time)  Medications Ordered in ED Medications  dextrose 5 %-0.45 % sodium chloride infusion ( Intravenous New Bag/Given 05/19/16 1508)  ondansetron (ZOFRAN)  injection 4 mg (4 mg Intravenous Given 05/19/16 1507)  HYDROmorphone (DILAUDID) injection 2 mg (not administered)    Or  HYDROmorphone (DILAUDID) injection 2 mg (not administered)  HYDROmorphone (DILAUDID) injection 2 mg (2 mg Intravenous Given 05/19/16 1508)    Or  HYDROmorphone (DILAUDID) injection 2 mg ( Subcutaneous See Alternative 05/19/16 1508)  HYDROmorphone (DILAUDID) injection 2 mg (2 mg Intravenous Given 05/19/16 1557)    Or  HYDROmorphone (DILAUDID) injection 2 mg ( Subcutaneous See Alternative 05/19/16 1557)  HYDROmorphone (DILAUDID) injection 2 mg (2 mg Intravenous Given 05/19/16 1652)    Or  HYDROmorphone (DILAUDID) injection 2 mg ( Subcutaneous See Alternative 05/19/16 1652)  diphenhydrAMINE (BENADRYL) injection 25 mg (25 mg Intravenous Given 05/19/16 1507)  potassium chloride SA (K-DUR,KLOR-CON) CR tablet 40 mEq (40 mEq Oral Given 05/19/16 1652)  ketorolac (TORADOL) 30 MG/ML injection 30 mg (30 mg Intravenous Given 05/19/16 1814)     Initial Impression / Assessment and Plan / ED Course  I have reviewed the triage vital signs and the nursing notes.  Pertinent labs & imaging results that were available during my care of the patient were reviewed by me and considered in my medical decision making (see chart for details).  Clinical Course    26 year old female with history of sickle cell disease presents with concern for lower back pain consistent with prior sickle cell pain crises. Patient has no fever, no chest pain, no shortness of breath. Doesn't have any focal tenderness on exam, have low suspicion for fracture or acute spinal infection. No urinary symptoms, an overall low suspicion for urinary source of back pain. Abdominal exam is benign. Labs show hemoglobin at or better than baseline. Consider acute exacerbation of chronic pain versus sickle cell pain. Given toradol, dilaudid 2mg  x3 with continuing pain. Will admit for further care.   Final Clinical Impressions(s) / ED Diagnoses    Final diagnoses:  Sickle cell pain crisis ([place])  New Prescriptions New Prescriptions   No medications on file     Gareth Morgan, MD 05/19/16 [phone]

## 2016-05-19 NOTE — ED Notes (Signed)
Pt requested IV benadryl instead of oral.  MD notified and ordered changed.

## 2016-05-19 NOTE — Progress Notes (Signed)
Patient received from ED. Patient immediately called service response to order food . Patient is standing in the middle of the floor arguing with service response about meal nurse and nurse tech unable to get patient settled in at this time. Oncoming nurse made aware.

## 2016-05-19 NOTE — Progress Notes (Signed)
ANTICOAGULATION CONSULT NOTE - Initial Consult  Pharmacy Consult for Xarelto Indication: DVT  Allergies  Allergen Reactions  . Food Hives and Other (See Comments)    Pt states that she is allergic to carrots.   . Latex Rash    Patient Measurements:    Vital Signs: Temp: 98.4 F (36.9 C) (01/04 1920) Temp Source: Oral (01/04 1920) BP: 91/76 (01/04 1920) Pulse Rate: 88 (01/04 1920)  Labs:  Recent Labs  05/19/16 1455  HGB 9.7*  HCT 27.9*  PLT 537*  CREATININE 0.58    Estimated Creatinine Clearance: 103.4 mL/min (by C-G formula based on SCr of 0.58 mg/dL).   Medical History: Past Medical History:  Diagnosis Date  . Blood transfusion    "lots"  . Blood transfusion without reported diagnosis   . Chronic back pain    "very severe; have knot in my back; from tight muscle; take RX and exercise for it"  . Depression 01/06/2011  . Exertional dyspnea    "sometimes"  . Genital HSV   . GERD (gastroesophageal reflux disease) 02/17/2011  . Migraines 11/08/11   "@ least twice/month"  . 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  . Mood swings ([place]) 11/08/11   "I go back and forth; real bad"  . Sickle cell anemia ([name])   . Sickle cell anemia with crisis ([place])   . Trichotillomania    h/o    Medications:  Scheduled:  . [START ON 05/20/2016] DULoxetine  30 mg Oral Daily  . HYDROmorphone   Intravenous Q4H  .  HYDROmorphone (DILAUDID) injection  2 mg Intravenous Q2H  . hydroxyurea  1,000 mg Oral Daily  . ketorolac  15 mg Intravenous Q6H  . morphine  30 mg Oral Q12H  . senna-docusate  1 tablet Oral BID   Infusions:  . dextrose 5 % and 0.45% NaCl      Assessment: 26 yo female presented with Sickle cell crisis to continue patient's Xarelto for DVT per pharmacy dosing. Per last admission, patient should have finished 15mg  BID 3 week BID dosing and started 20mg  once daily on 12/27. Baseline CBC good. Per PTA med list, patient has not had Xarelto in the past  week  Goal of Therapy:  monitor CBC and renal function   Plan:  1) Start Xarelto 20mg  once daily 2) Monitor bleelding, CBC and renal function   [name] Saran, PharmD, BCPS Pager [phone] 05/19/2016 7:26 PM

## 2016-05-19 NOTE — ED Triage Notes (Signed)
Pt reports back pain from sickle cell since last night. Location typical for crisis. No emesis or fever at home.

## 2016-05-19 NOTE — H&P (Signed)
History and Physical    [name] [id number] DOB: 1990/07/31 DOA: 05/19/2016  PCP: Angelica Chessman, MD  Patient coming from: home  Chief Complaint: back pain  HPI: [name] is a 26 y.o. female with medical history significant of significant for sickle cell recently discharged from the hospital on 05/02/2016 comes in complaining of back pain and bilateral lower extremity pain for the last 3 days. She relates she has been trying to control her pain at home with her regimen but it has not improved. She related has progressively gotten worse over the last 24 hours. She denies any fever cough shortness of breath.  ED Course:  In the ED a CBC was done that showed a mild leukocytosis with mild thrombocytosis, she was given several doses of IV Dilaudid in total 6 mg with no improvement in her pain  Review of Systems: As per HPI otherwise 10 point review of systems negative.    Past Medical History:  Diagnosis Date  . Blood transfusion    "lots"  . Blood transfusion without reported diagnosis   . Chronic back pain    "very severe; have knot in my back; from tight muscle; take RX and exercise for it"  . Depression 01/06/2011  . Exertional dyspnea    "sometimes"  . Genital HSV   . GERD (gastroesophageal reflux disease) 02/17/2011  . Migraines 11/08/11   "@ least twice/month"  . 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  . Mood swings ([place]) 11/08/11   "I go back and forth; real bad"  . Sickle cell anemia ([name])   . Sickle cell anemia with crisis ([place])   . Trichotillomania    h/o    Past Surgical History:  Procedure Laterality Date  . CHOLECYSTECTOMY  05/2010  . DILATION AND CURETTAGE OF UTERUS  02/20/11   S/P miscarriage  . IR GENERIC HISTORICAL  12/23/2015   IR FLUORO GUIDE CV LINE RIGHT 12/23/2015 Jacqulynn Cadet, MD [facility]  . IR GENERIC HISTORICAL  12/23/2015   IR US GUIDE VASC ACCESS RIGHT 12/23/2015 Jacqulynn Cadet, MD [facility]     reports  that she quit smoking about 3 years ago. Her smoking use included Cigarettes. She has a 0.25 pack-year smoking history. She has never used smokeless tobacco. She reports that she uses drugs, including Other-see comments and Cocaine. She reports that she does not drink alcohol.  Allergies  Allergen Reactions  . Food Hives and Other (See Comments)    Pt states that she is allergic to carrots.   . Latex Rash    Family History  Problem Relation Age of Onset  . Sickle cell trait Mother   . Sickle cell trait Father   . Diabetes Maternal Grandmother   . Diabetes Paternal Grandmother   . Hypertension Paternal Grandmother   . Diabetes Maternal Grandfather     Prior to Admission medications   Medication Sig Start Date End Date Taking? Authorizing Provider  aspirin-acetaminophen-caffeine (EXCEDRIN MIGRAINE) [phone] MG tablet Take 2 tablets by mouth every 6 (six) hours as needed for headache.    Yes Historical Provider, MD  cyclobenzaprine (FLEXERIL) 10 MG tablet Take 1 tablet (10 mg total) by mouth 3 (three) times daily as needed for muscle spasms. 05/03/16  Yes Tresa Garter, MD  hydroxyurea (HYDREA) 500 MG capsule Take 2 capsules (1,000 mg total) by mouth daily. May take with food to minimize GI side effects. 04/18/16  Yes Tresa Garter, MD  ibuprofen (ADVIL,MOTRIN) 600  MG tablet Take 1 tablet (600 mg total) by mouth every 6 (six) hours as needed for mild pain or moderate pain. 03/14/16  Yes Olugbemiga Essie Christine, MD  morphine (MS CONTIN) 30 MG 12 hr tablet Take 1 tablet (30 mg total) by mouth every 12 (twelve) hours. 05/03/16  Yes Olugbemiga Essie Christine, MD  oxyCODONE (ROXICODONE) 15 MG immediate release tablet Take 1 tablet (15 mg total) by mouth every 4 (four) hours as needed for pain. 05/18/16 06/02/16 Yes Tresa Garter, MD  Rivaroxaban (XARELTO STARTER PACK) 15 & 20 MG TBPK Take as directed on package: Start with one 15mg  tablet by mouth twice a day with food. On Day 22, switch to  one 20mg  tablet once a day with food. 04/20/16  Yes [name], MD  valACYclovir (VALTREX) 1000 MG tablet Take 1 tablet (1,000 mg total) by mouth daily. 05/02/16  Yes [name], MD  DULoxetine (CYMBALTA) 30 MG capsule Take 1 capsule (30 mg total) by mouth daily. Patient not taking: Reported on 05/06/2016 05/03/16   [name], MD    Physical Exam: Vitals:   05/19/16 1308 05/19/16 1405  BP: 102/68 102/76  Pulse: 92 87  Resp: 18 18  Temp: 98 F (36.7 C) 98.2 F (36.8 C)  TempSrc: Oral Oral  SpO2: 97% 100%      Constitutional: NAD, calm, comfortable Vitals:   05/19/16 1308 05/19/16 1405  BP: 102/68 102/76  Pulse: 92 87  Resp: 18 18  Temp: 98 F (36.7 C) 98.2 F (36.8 C)  TempSrc: Oral Oral  SpO2: 97% 100%   Eyes: PERRL, lids and conjunctivae normal ENMT: Mucous membranes are moist. Posterior pharynx clear of any exudate or lesions.Normal dentition.  Neck: normal, supple, no masses, no thyromegaly Respiratory: clear to auscultation bilaterallyNormal respiratory effort.  Cardiovascular: Regular rate and rhythm, no murmurs / rubs / gallops.  Abdomen: no tenderness, no masses palpated. No hepatosplenomegaly. Bowel sounds positive.  Musculoskeletal: no clubbing / cyanosis. Skin: no rashes, lesions, ulcers. No induration Neurologic: CN 2-12 grossly intact. Sensation intact, DTR normal. Strength 5/5 in all 4.  Psychiatric: Normal judgment and insight. Alert and oriented x 3. Normal mood.   Labs on Admission: I have personally reviewed following labs and imaging studies  CBC:  Recent Labs Lab 05/19/16 1455  WBC 11.6*  NEUTROABS 7.5  HGB 9.7*  HCT 27.9*  MCV 84.0  PLT 123456*   Basic Metabolic Panel:  Recent Labs Lab 05/19/16 1455  NA 137  K 3.1*  CL 105  CO2 24  GLUCOSE 96  BUN 5*  CREATININE 0.58  CALCIUM 8.8*   GFR: Estimated Creatinine Clearance: 103.4 mL/min (by C-G formula based on SCr of 0.58 mg/dL). Liver Function  Tests:  Recent Labs Lab 05/19/16 1455  AST 14*  ALT 12*  ALKPHOS 58  BILITOT 3.1*  PROT 6.9  ALBUMIN 4.6   No results for input(s): LIPASE, AMYLASE in the last 168 hours. No results for input(s): AMMONIA in the last 168 hours. Coagulation Profile: No results for input(s): INR, PROTIME in the last 168 hours. Cardiac Enzymes: No results for input(s): CKTOTAL, CKMB, CKMBINDEX, TROPONINI in the last 168 hours. BNP (last 3 results) No results for input(s): PROBNP in the last 8760 hours. HbA1C: No results for input(s): HGBA1C in the last 72 hours. CBG: No results for input(s): GLUCAP in the last 168 hours. Lipid Profile: No results for input(s): CHOL, HDL, LDLCALC, TRIG, CHOLHDL, LDLDIRECT in the last 72 hours. Thyroid  Function Tests: No results for input(s): TSH, T4TOTAL, FREET4, T3FREE, THYROIDAB in the last 72 hours. Anemia Panel:  Recent Labs  05/19/16 1455  RETICCTPCT 8.2*   Urine analysis:    Component Value Date/Time   COLORURINE YELLOW 04/30/2016 1010   APPEARANCEUR CLEAR 04/30/2016 1010   APPEARANCEUR Clear 05/06/2012 1546   LABSPEC 1.006 04/30/2016 1010   LABSPEC 1.011 05/06/2012 1546   PHURINE 6.0 04/30/2016 1010   GLUCOSEU NEGATIVE 04/30/2016 1010   GLUCOSEU Negative 05/06/2012 1546   HGBUR SMALL (A) 04/30/2016 1010   HGBUR negative 01/08/2009 1329   BILIRUBINUR NEGATIVE 04/30/2016 1010   BILIRUBINUR Negative 05/06/2012 1546   KETONESUR NEGATIVE 04/30/2016 1010   PROTEINUR NEGATIVE 04/30/2016 1010   UROBILINOGEN 2.0 (H) 10/26/2015 0817   NITRITE NEGATIVE 04/30/2016 1010   LEUKOCYTESUR NEGATIVE 04/30/2016 1010   LEUKOCYTESUR Negative 05/06/2012 1546   Sepsis Labs: !!!!!!!!!!!!!!!!!!!!!!!!!!!!!!!!!!!!!!!!!!!! @LABRCNTIP (procalcitonin:4,lacticidven:4) )No results found for this or any previous visit (from the past 240 hour(s)).   Radiological Exams on Admission: No results found.  EKG: Independently reviewed. None  Assessment/Plan Active  Problems:   Anemia of chronic disease   Cocaine use   Chronic pain   Sickle cell anemia ([name])  I will go ahead and continue her on D5 and half-normal saline, she denies any fevers she has a mild leukocytosis which is probably stress demargination. We'll check a chest x-ray, will start her on IV PCA pump and IV scheduled for 3 doses. We'll start her on oral Benadryl for itching orally. Continue MS Contin home dose. Her hemoglobin seems to be stable compared to previous values. Continue to monitor fever curve check blood cultures if she spikes a fever. Give her a diet measure strict I's and O's and daily weights.   DVT prophylaxis: xarelto Code Status: full Family Communication: none Consults called: None Admission status: inpatient   Charlynne Cousins MD [facility] Pager [phone]  If 7PM-7AM, please contact night-coverage [email or web address] Password [facility]  05/19/2016, 6:04 PM

## 2016-05-19 NOTE — Patient Outreach (Signed)
[facility]) Care Management  05/19/2016  GERELDINE NUCCIO [birth year] [id number]   Outreach letter sent on 12/21, no response.  Ability to contact has been ongoing issue since case initiallly opened March 2017.  Will notify member, primary MD, and care management assistant of case closure due to inability to obtain/maintain contact.  Valente David, [place], MSN [place] [phone]

## 2016-05-20 DIAGNOSIS — G8929 Other chronic pain: Secondary | ICD-10-CM

## 2016-05-20 DIAGNOSIS — F1410 Cocaine abuse, uncomplicated: Secondary | ICD-10-CM

## 2016-05-20 DIAGNOSIS — M549 Dorsalgia, unspecified: Secondary | ICD-10-CM | POA: Diagnosis not present

## 2016-05-20 DIAGNOSIS — D5700 Hb-SS disease with crisis, unspecified: Principal | ICD-10-CM

## 2016-05-20 DIAGNOSIS — D638 Anemia in other chronic diseases classified elsewhere: Secondary | ICD-10-CM

## 2016-05-20 LAB — URINALYSIS, ROUTINE W REFLEX MICROSCOPIC
BILIRUBIN URINE: NEGATIVE
GLUCOSE, UA: NEGATIVE mg/dL
Hgb urine dipstick: NEGATIVE
KETONES UR: NEGATIVE mg/dL
Nitrite: NEGATIVE
PH: 6 (ref 5.0–8.0)
Protein, ur: NEGATIVE mg/dL
SPECIFIC GRAVITY, URINE: 1.012 (ref 1.005–1.030)

## 2016-05-20 LAB — RETICULOCYTES
RBC.: 2.77 MIL/uL — AB (ref 3.87–5.11)
RETIC CT PCT: 9 % — AB (ref 0.4–3.1)
Retic Count, Absolute: 249.3 10*3/uL — ABNORMAL HIGH (ref 19.0–186.0)

## 2016-05-20 LAB — DIFFERENTIAL
BASOS ABS: 0.1 10*3/uL (ref 0.0–0.1)
Basophils Relative: 0 %
EOS ABS: 0.7 10*3/uL (ref 0.0–0.7)
EOS PCT: 5 %
LYMPHS ABS: 3.9 10*3/uL (ref 0.7–4.0)
Lymphocytes Relative: 25 %
MONOS PCT: 13 %
Monocytes Absolute: 2.1 10*3/uL — ABNORMAL HIGH (ref 0.1–1.0)
NEUTROS PCT: 57 %
Neutro Abs: 9 10*3/uL — ABNORMAL HIGH (ref 1.7–7.7)

## 2016-05-20 LAB — CBC
HCT: 23.4 % — ABNORMAL LOW (ref 36.0–46.0)
HEMOGLOBIN: 8.3 g/dL — AB (ref 12.0–15.0)
MCH: 29.9 pg (ref 26.0–34.0)
MCHC: 35.5 g/dL (ref 30.0–36.0)
MCV: 84.2 fL (ref 78.0–100.0)
Platelets: 450 10*3/uL — ABNORMAL HIGH (ref 150–400)
RBC: 2.78 MIL/uL — AB (ref 3.87–5.11)
RDW: 16.3 % — ABNORMAL HIGH (ref 11.5–15.5)
WBC: 15.8 10*3/uL — AB (ref 4.0–10.5)

## 2016-05-20 MED ORDER — HYDROMORPHONE 1 MG/ML IV SOLN
INTRAVENOUS | Status: DC
  Administered 2016-05-20: 4.79 mg via INTRAVENOUS
  Administered 2016-05-20: 4.7 mg via INTRAVENOUS
  Administered 2016-05-20: 2 mg via INTRAVENOUS
  Administered 2016-05-21: 3.3 mg via INTRAVENOUS
  Administered 2016-05-21: 25 mg via INTRAVENOUS
  Administered 2016-05-21: 2.6 mg via INTRAVENOUS
  Administered 2016-05-21: 2 mg via INTRAVENOUS
  Administered 2016-05-21: 6.2 mg via INTRAVENOUS
  Administered 2016-05-21: 4.8 mg via INTRAVENOUS
  Administered 2016-05-21: 2 mg via INTRAVENOUS
  Administered 2016-05-21: 6.3 mg via INTRAVENOUS
  Administered 2016-05-21: 5.6 mg via INTRAVENOUS
  Administered 2016-05-21: 25 mg via INTRAVENOUS
  Administered 2016-05-21: 2 mg via INTRAVENOUS
  Administered 2016-05-22: 2.6 mg via INTRAVENOUS
  Administered 2016-05-22: 2 mg via INTRAVENOUS
  Administered 2016-05-22: 25 mg via INTRAVENOUS
  Administered 2016-05-22: 3.6 mg via INTRAVENOUS
  Administered 2016-05-22: 4.4 mg via INTRAVENOUS
  Administered 2016-05-22 (×2): 2 mg via INTRAVENOUS
  Administered 2016-05-22: 5.6 mg via INTRAVENOUS
  Administered 2016-05-22: 4.79 mg via INTRAVENOUS
  Administered 2016-05-22: 2.1 mg via INTRAVENOUS
  Administered 2016-05-23: 5.4 mg via INTRAVENOUS
  Administered 2016-05-23: 5 mg via INTRAVENOUS
  Administered 2016-05-23: 2 mg via INTRAVENOUS
  Administered 2016-05-23: 25 mg via INTRAVENOUS
  Administered 2016-05-23 (×3): 2 mg via INTRAVENOUS
  Administered 2016-05-23: 5 mg via INTRAVENOUS
  Administered 2016-05-23: 2 mg via INTRAVENOUS
  Administered 2016-05-23: 3 mg via INTRAVENOUS
  Administered 2016-05-23: 2 mg via INTRAVENOUS
  Administered 2016-05-24: 25 mg via INTRAVENOUS
  Administered 2016-05-24: 1.2 mg via INTRAVENOUS
  Administered 2016-05-24: 5 mg via INTRAVENOUS
  Administered 2016-05-24: 2 mg via INTRAVENOUS
  Administered 2016-05-24: 5 mg via INTRAVENOUS
  Administered 2016-05-24: 2 mg via INTRAVENOUS
  Administered 2016-05-24: 10.2 mg via INTRAVENOUS
  Administered 2016-05-24: 11.6 mg via INTRAVENOUS
  Filled 2016-05-20 (×5): qty 25

## 2016-05-20 MED ORDER — HYDROMORPHONE HCL 2 MG/ML IJ SOLN
2.0000 mg | Freq: Once | INTRAMUSCULAR | Status: AC | PRN
  Administered 2016-05-20: 2 mg via INTRAVENOUS
  Filled 2016-05-20: qty 1

## 2016-05-20 MED ORDER — DIPHENHYDRAMINE HCL 50 MG/ML IJ SOLN
25.0000 mg | INTRAMUSCULAR | Status: DC | PRN
  Filled 2016-05-20: qty 0.5

## 2016-05-20 MED ORDER — ONDANSETRON HCL 4 MG/2ML IJ SOLN
4.0000 mg | Freq: Four times a day (QID) | INTRAMUSCULAR | Status: DC | PRN
  Administered 2016-05-22: 4 mg via INTRAVENOUS
  Filled 2016-05-20: qty 2

## 2016-05-20 MED ORDER — SODIUM CHLORIDE 0.9% FLUSH
10.0000 mL | Freq: Two times a day (BID) | INTRAVENOUS | Status: DC
  Administered 2016-05-20: 10 mL
  Administered 2016-05-23 – 2016-05-24 (×2): 20 mL

## 2016-05-20 MED ORDER — NALOXONE HCL 0.4 MG/ML IJ SOLN: 0.4000 mg | INTRAMUSCULAR | Status: DC | PRN

## 2016-05-20 MED ORDER — DIPHENHYDRAMINE HCL 25 MG PO CAPS
25.0000 mg | ORAL_CAPSULE | ORAL | Status: DC | PRN
  Administered 2016-05-21 – 2016-05-24 (×2): 50 mg via ORAL
  Filled 2016-05-20 (×2): qty 2

## 2016-05-20 MED ORDER — SODIUM CHLORIDE 0.9% FLUSH
9.0000 mL | INTRAVENOUS | Status: DC | PRN
Start: 2016-05-20 — End: 2016-05-25

## 2016-05-20 MED ORDER — SODIUM CHLORIDE 0.9% FLUSH: 10.0000 mL | INTRAVENOUS | Status: DC | PRN

## 2016-05-20 MED ORDER — KETOROLAC TROMETHAMINE 15 MG/ML IJ SOLN
30.0000 mg | Freq: Four times a day (QID) | INTRAMUSCULAR | Status: AC
  Administered 2016-05-20 – 2016-05-24 (×16): 30 mg via INTRAVENOUS
  Filled 2016-05-20 (×16): qty 2

## 2016-05-20 MED ORDER — HYDROMORPHONE 1 MG/ML IV SOLN: INTRAVENOUS | Status: DC

## 2016-05-20 NOTE — Progress Notes (Signed)
SICKLE CELL SERVICE PROGRESS NOTE  [name] [id number] DOB: 1990/08/29 DOA: 05/19/2016 PCP: Angelica Chessman, MD  Assessment/Plan: Active Problems:   Anemia of chronic disease   Cocaine use   Chronic pain   Sickle cell anemia ([name])   Vasoocclusive sickle cell crisis ([place])  1. Hb SS with crisis: Pt is opiate tolerant and c/o pain in low back at 8/10. She is currently in a full dose PCA which is inadequate given her tolerance. Will change PCA to individualized dose of Dilaudid 0.6 mg demand, and 2 mg bolus every 3 hours. Continue Toradol and IVF.  2. Leukocytosis: Likely related to crisis.No evidence of infection.  3. Anemia of Chronic Disease: Hb at baseline. Continue. Pt admits that she has not been taking her Hydrea every day.  4. Chronic pain: Continue MS Contin.  5. Substance Abuse: Pt admits that she has been using cocaine on a habitual basis since November. She has explored detox via [facility] but apparently need a letter form her PMD in order to enter residentiall treatment.  Code Status: Full Code Family Communication: N/A Disposition Plan: Not yet ready for discharge  [place].  Pager [phone]. If 7PM-7AM, please contact night-coverage.  05/20/2016, 1:00 PM  LOS: 1 day   Interim History: Pt was placed on inadequate pain regimen given her Opiate tolerance. She rate her pain as 8/10 and localized to her lower back. She has used 7.54 mg with 27/24:demands/deliveries since admission. She also reports that she has been using cocaine habitually and last use was 2 days ago.   Consultants:  None  Procedures:  None  Antibiotics:  None   Objective: Vitals:   05/20/16 0715 05/20/16 0817 05/20/16 1048 05/20/16 1218  BP:   114/78   Pulse: 95  93   Resp: (!) 115 19 11 19   Temp:   98.4 F (36.9 C)   TempSrc:   Oral   SpO2:  92% 96% 97%  Weight: 76.7 kg (169 lb 3.2 oz)     Height:       Weight change:   Intake/Output Summary (Last 24 hours) at  05/20/16 1300 Last data filed at 05/20/16 1256  Gross per 24 hour  Intake              240 ml  Output                0 ml  Net              240 ml    General: Alert, awake, oriented x3, in mderate distress.  HEENT: [place]/AT PEERL, EOMI, anicteric. Neck: Trachea midline,  no masses, no thyromegal,y no JVD, no carotid bruit OROPHARYNX:  Moist, No exudate/ erythema/lesions.  Heart: Regular rate and rhythm, without murmurs, rubs, gallops, PMI non-displaced, no heaves or thrills on palpation.  Lungs: Clear to auscultation, no wheezing or rhonchi noted. No increased vocal fremitus resonant to percussion  Abdomen: Soft, nontender, nondistended, positive bowel sounds, no masses no hepatosplenomegaly noted..  Neuro: No focal neurological deficits noted cranial nerves II through XII grossly intact.  Strength at functional baseline in bilateral upper and lower extremities. Musculoskeletal: No warmth swelling or erythema around joints, no spinal tenderness noted. Psychiatric: Patient alert and oriented x3, good insight and cognition, good recent to remote recall.    Data Reviewed: Basic Metabolic Panel:  Recent Labs Lab 05/19/16 1455  NA 137  K 3.1*  CL 105  CO2 24  GLUCOSE 96  BUN 5*  CREATININE  0.58  CALCIUM 8.8*   Liver Function Tests:  Recent Labs Lab 05/19/16 1455  AST 14*  ALT 12*  ALKPHOS 58  BILITOT 3.1*  PROT 6.9  ALBUMIN 4.6   No results for input(s): LIPASE, AMYLASE in the last 168 hours. No results for input(s): AMMONIA in the last 168 hours. CBC:  Recent Labs Lab 05/19/16 1455 05/20/16 0625  WBC 11.6* 15.8*  NEUTROABS 7.5 9.0*  HGB 9.7* 8.3*  HCT 27.9* 23.4*  MCV 84.0 84.2  PLT 537* 450*   Cardiac Enzymes: No results for input(s): CKTOTAL, CKMB, CKMBINDEX, TROPONINI in the last 168 hours. BNP (last 3 results) No results for input(s): BNP in the last 8760 hours.  ProBNP (last 3 results) No results for input(s): PROBNP in the last 8760  hours.  CBG: No results for input(s): GLUCAP in the last 168 hours.  No results found for this or any previous visit (from the past 240 hour(s)).   Studies: Dg Chest 1 View  Result Date: 05/19/2016 CLINICAL DATA:  Back pain, chest pain and shortness of breath. EXAM: CHEST 1 VIEW COMPARISON:  Body CT 02/26/2016 FINDINGS: Injectable right IJ approach central venous catheter terminates in the superior vena cava. The cardiac silhouette is mildly enlarged. Mediastinal contours appear intact. There is no evidence of focal airspace consolidation, pleural effusion or pneumothorax. Osseous structures are without acute abnormality. 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 Soft tissues are grossly normal. IMPRESSION: Mildly enlarged cardiac silhouette, otherwise no evidence of acute disease. Electronically Signed   By: Fidela Salisbury M.D.   On: 05/19/2016 20:02    Scheduled Meds: . DULoxetine  30 mg Oral Daily  . HYDROmorphone   Intravenous Q4H  . hydroxyurea  1,000 mg Oral Daily  . ketorolac  30 mg Intravenous Q6H  . morphine  30 mg Oral Q12H  . rivaroxaban  20 mg Oral Q supper  . senna-docusate  1 tablet Oral BID  . sodium chloride flush  10-40 mL Intracatheter Q12H   Continuous Infusions: . dextrose 5 % and 0.45% NaCl 100 mL/hr at 05/20/16 1004    Active Problems:   Anemia of chronic disease   Cocaine use   Chronic pain   Sickle cell anemia ([name])   Vasoocclusive sickle cell crisis ([name])    In excess of 25 minutes spent during this visit. Greater than 50% involved face to face contact with the patient for assessment, counseling and coordination of care.

## 2016-05-21 DIAGNOSIS — G8929 Other chronic pain: Secondary | ICD-10-CM | POA: Diagnosis not present

## 2016-05-21 DIAGNOSIS — D5700 Hb-SS disease with crisis, unspecified: Secondary | ICD-10-CM | POA: Diagnosis not present

## 2016-05-21 DIAGNOSIS — M549 Dorsalgia, unspecified: Secondary | ICD-10-CM | POA: Diagnosis not present

## 2016-05-21 DIAGNOSIS — D638 Anemia in other chronic diseases classified elsewhere: Secondary | ICD-10-CM | POA: Diagnosis not present

## 2016-05-21 NOTE — Progress Notes (Signed)
Patient ID: [name], female   DOB: 11-06-1990, 26 y.o.   MRN: [id number] Subjective:  Patient admitted for sickle cell pain crisis. No new complaint. Pain still at 8/10. No fever, no chest pain, no SOB.   Objective:  Vital signs in last 24 hours:  Vitals:   05/21/16 0428 05/21/16 0429 05/21/16 0741 05/21/16 0900  BP: 119/78   111/70  Pulse: 100   90  Resp: 18  16 18   Temp: 98 F (36.7 C)   99.1 F (37.3 C)  TempSrc: Oral   Oral  SpO2: 96%  100% 95%  Weight:  77.1 kg (170 lb)    Height:        Intake/Output from previous day:   Intake/Output Summary (Last 24 hours) at 05/21/16 1149 Last data filed at 05/21/16 1140  Gross per 24 hour  Intake             3186 ml  Output             2575 ml  Net              611 ml    Physical Exam: General: Alert, awake, oriented x3, in no acute distress.  HEENT: [place]/AT PEERL, EOMI Neck: Trachea midline,  no masses, no thyromegal,y no JVD, no carotid bruit OROPHARYNX:  Moist, No exudate/ erythema/lesions.  Heart: Regular rate and rhythm, without murmurs, rubs, gallops, PMI non-displaced, no heaves or thrills on palpation.  Lungs: Clear to auscultation, no wheezing or rhonchi noted. No increased vocal fremitus resonant to percussion  Abdomen: Soft, nontender, nondistended, positive bowel sounds, no masses no hepatosplenomegaly noted..  Neuro: No focal neurological deficits noted cranial nerves II through XII grossly intact. DTRs 2+ bilaterally upper and lower extremities. Strength 5 out of 5 in bilateral upper and lower extremities. Musculoskeletal: No warm swelling or erythema around joints, no spinal tenderness noted. Psychiatric: Patient alert and oriented x3, good insight and cognition, good recent to remote recall. Lymph node survey: No cervical axillary or inguinal lymphadenopathy noted.  Lab Results:  Basic Metabolic Panel:    Component Value Date/Time   NA 137 05/19/2016 1455   NA 139 05/06/2012 1645   K 3.1 (L)  05/19/2016 1455   K 3.9 05/06/2012 1645   CL 105 05/19/2016 1455   CL 110 (H) 05/06/2012 1645   CO2 24 05/19/2016 1455   CO2 22 05/06/2012 1645   BUN 5 (L) 05/19/2016 1455   BUN 9 05/06/2012 1645   CREATININE 0.58 05/19/2016 1455   CREATININE 0.43 (L) 07/06/2015 1356   GLUCOSE 96 05/19/2016 1455   GLUCOSE 84 05/06/2012 1645   CALCIUM 8.8 (L) 05/19/2016 1455   CALCIUM 8.3 (L) 05/06/2012 1645   CBC:    Component Value Date/Time   WBC 15.8 (H) 05/20/2016 0625   HGB 8.3 (L) 05/20/2016 0625   HGB 8.7 (L) 05/11/2012 0721   HCT 23.4 (L) 05/20/2016 0625   HCT 26.9 (L) 05/11/2012 0721   PLT 450 (H) 05/20/2016 0625   PLT 497 (H) 05/11/2012 0721   MCV 84.2 05/20/2016 0625   MCV 94 05/11/2012 0721   NEUTROABS 9.0 (H) 05/20/2016 0625   NEUTROABS 10.9 (H) 05/10/2012 0552   LYMPHSABS 3.9 05/20/2016 0625   LYMPHSABS 3.5 05/10/2012 0552   MONOABS 2.1 (H) 05/20/2016 0625   MONOABS 1.7 (H) 05/10/2012 0552   EOSABS 0.7 05/20/2016 0625   EOSABS 0.8 (H) 05/10/2012 0552   BASOSABS 0.1 05/20/2016 0625   BASOSABS 0.1 05/10/2012  0552    No results found for this or any previous visit (from the past 240 hour(s)).  Studies/Results: Dg Chest 1 View  Result Date: 05/19/2016 CLINICAL DATA:  Back pain, chest pain and shortness of breath. EXAM: CHEST 1 VIEW COMPARISON:  Body CT 02/26/2016 FINDINGS: Injectable right IJ approach central venous catheter terminates in the superior vena cava. The cardiac silhouette is mildly enlarged. Mediastinal contours appear intact. There is no evidence of focal airspace consolidation, pleural effusion or pneumothorax. Osseous structures are without acute abnormality. 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 Soft tissues are grossly normal. IMPRESSION: Mildly enlarged cardiac silhouette, otherwise no evidence of acute disease. Electronically Signed   By: Fidela Salisbury M.D.   On: 05/19/2016 20:02    Medications: Scheduled  Meds: . DULoxetine  30 mg Oral Daily  . HYDROmorphone   Intravenous Q4H  . hydroxyurea  1,000 mg Oral Daily  . ketorolac  30 mg Intravenous Q6H  . morphine  30 mg Oral Q12H  . rivaroxaban  20 mg Oral Q supper  . senna-docusate  1 tablet Oral BID  . sodium chloride flush  10-40 mL Intracatheter Q12H   Continuous Infusions: . dextrose 5 % and 0.45% NaCl 1,000 mL (05/21/16 0354)   PRN Meds:.aspirin-acetaminophen-caffeine, diphenhydrAMINE **OR** diphenhydrAMINE (BENADRYL) IVPB(SICKLE CELL ONLY), naloxone **AND** sodium chloride flush, ondansetron (ZOFRAN) IV, ondansetron **OR** [DISCONTINUED] ondansetron (ZOFRAN) IV, polyethylene glycol, sodium chloride flush  Consultants:  None  Procedures:  None  Antibiotics:  None  Assessment/Plan: Active Problems:   Anemia of chronic disease   Cocaine use   Chronic pain   Sickle cell anemia ([name])   Vasoocclusive sickle cell crisis ([place])  1. Hb SS with crisis: Pt is opiate tolerant and c/o pain in low back, still at 8/10. Will continue her dilaudid PCA, Continue Toradol and IVF. Benadryl by mouth only. 2. Leukocytosis: Likely related to crisis. No evidence of infection.  3. Anemia of Chronic Disease: Hb at baseline. Continue Hydrea. 4. Chronic pain: Continue MS Contin.  5. Substance use disoerder: Pt admits that she has been using cocaine since November 2017 but not daily. She agrees to going for detox and may be Rehab  Code Status: Full Code Family Communication: N/A Disposition Plan: Not yet ready for discharge  Rahul Malinak  If 7PM-7AM, please contact night-coverage.  05/21/2016, 11:49 AM  LOS: 2 days

## 2016-05-22 ENCOUNTER — Inpatient Hospital Stay (HOSPITAL_COMMUNITY): Payer: Medicare Other

## 2016-05-22 DIAGNOSIS — D5700 Hb-SS disease with crisis, unspecified: Secondary | ICD-10-CM | POA: Diagnosis not present

## 2016-05-22 DIAGNOSIS — S4992XA Unspecified injury of left shoulder and upper arm, initial encounter: Secondary | ICD-10-CM | POA: Diagnosis not present

## 2016-05-22 DIAGNOSIS — M549 Dorsalgia, unspecified: Secondary | ICD-10-CM | POA: Diagnosis not present

## 2016-05-22 DIAGNOSIS — F1410 Cocaine abuse, uncomplicated: Secondary | ICD-10-CM | POA: Diagnosis not present

## 2016-05-22 DIAGNOSIS — D638 Anemia in other chronic diseases classified elsewhere: Secondary | ICD-10-CM | POA: Diagnosis not present

## 2016-05-22 NOTE — Progress Notes (Signed)
X ray of the left shoulder shows  no evidence of acute fracture or dislocation.

## 2016-05-22 NOTE — Progress Notes (Signed)
.  Patient called from her room that she fell and needs help , the nurse and the techs went to patient's room and found patient on the floor, head to toe assessment and vital signs done , Skin intact,helped patient back to bed patient c/o left shoulder,Dr Doreene Burke was notified, ordered x ray to left shoulder.

## 2016-05-22 NOTE — Progress Notes (Signed)
Subjective:  Patient admitted for sickle cell pain crisis. No new complaint except for ongoing pain. Pain still at 8/10. No fever, no chest pain, no SOB.   Objective:  Vital signs in last 24 hours:  Vitals:   05/22/16 0540 05/22/16 0545 05/22/16 0632 05/22/16 0740  BP:   118/81   Pulse:   84   Resp: 12 12 14 14   Temp:   98.4 F (36.9 C)   TempSrc:   Oral   SpO2:   100% 94%  Weight:   82.1 kg (181 lb)   Height:        Intake/Output from previous day:   Intake/Output Summary (Last 24 hours) at 05/22/16 1043 Last data filed at 05/22/16 [id number]  Gross per 24 hour  Intake             3101 ml  Output             2450 ml  Net              651 ml    Physical Exam: General: Alert, awake, oriented x3, in no acute distress.  HEENT: [place]/AT PEERL, EOMI Neck: Trachea midline,  no masses, no thyromegal,y no JVD, no carotid bruit OROPHARYNX:  Moist, No exudate/ erythema/lesions.  Heart: Regular rate and rhythm, without murmurs, rubs, gallops, PMI non-displaced, no heaves or thrills on palpation.  Lungs: Clear to auscultation, no wheezing or rhonchi noted. No increased vocal fremitus resonant to percussion  Abdomen: Soft, nontender, nondistended, positive bowel sounds, no masses no hepatosplenomegaly noted..  Neuro: No focal neurological deficits noted cranial nerves II through XII grossly intact. DTRs 2+ bilaterally upper and lower extremities. Strength 5 out of 5 in bilateral upper and lower extremities. Musculoskeletal: No warm swelling or erythema around joints, no spinal tenderness noted. Psychiatric: Patient alert and oriented x3, good insight and cognition, good recent to remote recall. Lymph node survey: No cervical axillary or inguinal lymphadenopathy noted.  Lab Results:  Basic Metabolic Panel:    Component Value Date/Time   NA 137 05/19/2016 1455   NA 139 05/06/2012 1645   K 3.1 (L) 05/19/2016 1455   K 3.9 05/06/2012 1645   CL 105 05/19/2016 1455   CL 110 (H) 05/06/2012  1645   CO2 24 05/19/2016 1455   CO2 22 05/06/2012 1645   BUN 5 (L) 05/19/2016 1455   BUN 9 05/06/2012 1645   CREATININE 0.58 05/19/2016 1455   CREATININE 0.43 (L) 07/06/2015 1356   GLUCOSE 96 05/19/2016 1455   GLUCOSE 84 05/06/2012 1645   CALCIUM 8.8 (L) 05/19/2016 1455   CALCIUM 8.3 (L) 05/06/2012 1645   CBC:    Component Value Date/Time   WBC 15.8 (H) 05/20/2016 0625   HGB 8.3 (L) 05/20/2016 0625   HGB 8.7 (L) 05/11/2012 0721   HCT 23.4 (L) 05/20/2016 0625   HCT 26.9 (L) 05/11/2012 0721   PLT 450 (H) 05/20/2016 0625   PLT 497 (H) 05/11/2012 0721   MCV 84.2 05/20/2016 0625   MCV 94 05/11/2012 0721   NEUTROABS 9.0 (H) 05/20/2016 0625   NEUTROABS 10.9 (H) 05/10/2012 0552   LYMPHSABS 3.9 05/20/2016 0625   LYMPHSABS 3.5 05/10/2012 0552   MONOABS 2.1 (H) 05/20/2016 0625   MONOABS 1.7 (H) 05/10/2012 0552   EOSABS 0.7 05/20/2016 0625   EOSABS 0.8 (H) 05/10/2012 0552   BASOSABS 0.1 05/20/2016 0625   BASOSABS 0.1 05/10/2012 0552    No results found for this or any previous visit (from the  past 240 hour(s)).  Studies/Results: No results found.  Medications: Scheduled Meds: . DULoxetine  30 mg Oral Daily  . HYDROmorphone   Intravenous Q4H  . hydroxyurea  1,000 mg Oral Daily  . ketorolac  30 mg Intravenous Q6H  . morphine  30 mg Oral Q12H  . rivaroxaban  20 mg Oral Q supper  . senna-docusate  1 tablet Oral BID  . sodium chloride flush  10-40 mL Intracatheter Q12H   Continuous Infusions: . dextrose 5 % and 0.45% NaCl 100 mL/hr at 05/22/16 0540   PRN Meds:.aspirin-acetaminophen-caffeine, diphenhydrAMINE **OR** diphenhydrAMINE (BENADRYL) IVPB(SICKLE CELL ONLY), naloxone **AND** sodium chloride flush, ondansetron (ZOFRAN) IV, ondansetron **OR** [DISCONTINUED] ondansetron (ZOFRAN) IV, polyethylene glycol, sodium chloride flush  Consultants:  None  Procedures:  None  Antibiotics:  None  Assessment/Plan: Active Problems:   Anemia of chronic disease   Cocaine  use   Chronic pain   Sickle cell anemia ([name])   Vasoocclusive sickle cell crisis ([place])  1. Hb SS with crisis: Pt is opiate tolerant and c/o pain in low back, still at 8/10 today. Will continue her dilaudid PCA, Continue Toradol and IVF. Benadryl by mouth only. 2. Leukocytosis: Likely related to crisis. No evidence of infection.  3. Anemia of Chronic Disease: Hb at baseline. Continue Hydrea. 4. Chronic pain: Continue MS Contin.  5. Substance use disoerder: Pt admits that she has been using cocaine since November 2017 but not daily. She agrees to going for detox and may be Rehab upon discharge  Code Status: Full Code Family Communication: N/A Disposition Plan: Not yet ready for discharge  [name]  If 7PM-7AM, please contact night-coverage.  05/22/2016, 10:43 AM  LOS: 3 days Patient ID: [name], female   DOB: October 17, 1990, 26 y.o.   MRN: [id number]

## 2016-05-22 NOTE — Progress Notes (Signed)
ANTICOAGULATION CONSULT NOTE - Follow Up  Pharmacy Consult for Xarelto Indication: DVT  Allergies  Allergen Reactions  . Food Hives and Other (See Comments)    Pt states that she is allergic to carrots.   . Latex Rash    Patient Measurements: Height: 5\' 1"  (154.9 cm) Weight: 181 lb (82.1 kg) IBW/kg (Calculated) : 47.8  Vital Signs: Temp: 98.4 F (36.9 C) (01/07 0632) Temp Source: Oral (01/07 [id number]) BP: 118/81 (01/07 [id number]) Pulse Rate: 84 (01/07 0632)  Labs:  Recent Labs  05/19/16 1455 05/20/16 0625  HGB 9.7* 8.3*  HCT 27.9* 23.4*  PLT 537* 450*  CREATININE 0.58  --     Estimated Creatinine Clearance: 105.3 mL/min (by C-G formula based on SCr of 0.58 mg/dL).   Medical History: Past Medical History:  Diagnosis Date  . Blood transfusion    "lots"  . Blood transfusion without reported diagnosis   . Chronic back pain    "very severe; have knot in my back; from tight muscle; take RX and exercise for it"  . Depression 01/06/2011  . Exertional dyspnea    "sometimes"  . Genital HSV   . GERD (gastroesophageal reflux disease) 02/17/2011  . Migraines 11/08/11   "@ least twice/month"  . 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  . Mood swings ([place]) 11/08/11   "I go back and forth; real bad"  . Sickle cell anemia ([name])   . Sickle cell anemia with crisis ([place])   . Trichotillomania    h/o    Medications:  Scheduled:  . DULoxetine  30 mg Oral Daily  . HYDROmorphone   Intravenous Q4H  . hydroxyurea  1,000 mg Oral Daily  . ketorolac  30 mg Intravenous Q6H  . morphine  30 mg Oral Q12H  . rivaroxaban  20 mg Oral Q supper  . senna-docusate  1 tablet Oral BID  . sodium chloride flush  10-40 mL Intracatheter Q12H   Infusions:  . dextrose 5 % and 0.45% NaCl 100 mL/hr at 05/22/16 0540    Assessment: 26 yo female presented with Sickle cell crisis to continue patient's Xarelto for DVT per pharmacy dosing. Per last admission, patient should have finished 15mg  BID  3 week BID dosing and started 20mg  once daily on 12/27. Baseline CBC good. Per PTA med list, patient has not had Xarelto in the past week  Goal of Therapy:  monitor CBC and renal function   Plan:  1) Continue Xarelto 20mg  once daily 2) Monitor bleelding, CBC and renal function (labs ordered for tomorrow)   Peggyann Juba, PharmD, BCPS Pager: [phone] 05/22/2016 10:27 AM

## 2016-05-23 DIAGNOSIS — M549 Dorsalgia, unspecified: Secondary | ICD-10-CM | POA: Diagnosis not present

## 2016-05-23 DIAGNOSIS — D62 Acute posthemorrhagic anemia: Secondary | ICD-10-CM

## 2016-05-23 DIAGNOSIS — D5700 Hb-SS disease with crisis, unspecified: Secondary | ICD-10-CM | POA: Diagnosis not present

## 2016-05-23 LAB — BASIC METABOLIC PANEL
Anion gap: 4 — ABNORMAL LOW (ref 5–15)
BUN: 6 mg/dL (ref 6–20)
CALCIUM: 8 mg/dL — AB (ref 8.9–10.3)
CO2: 30 mmol/L (ref 22–32)
CREATININE: 0.55 mg/dL (ref 0.44–1.00)
Chloride: 104 mmol/L (ref 101–111)
GFR calc non Af Amer: >60 mL/min (ref >60)
Glucose, Bld: 98 mg/dL (ref 65–99)
Potassium: 3.7 mmol/L (ref 3.5–5.1)
Sodium: 138 mmol/L (ref 135–145)

## 2016-05-23 LAB — CBC WITH DIFFERENTIAL/PLATELET
BAND NEUTROPHILS: 0 %
BASOS ABS: 0 10*3/uL (ref 0.0–0.1)
BASOS PCT: 0 %
Blasts: 0 %
EOS ABS: 1.1 10*3/uL — AB (ref 0.0–0.7)
EOS PCT: 10 %
HCT: 17.7 % — ABNORMAL LOW (ref 36.0–46.0)
HEMOGLOBIN: 6.1 g/dL — AB (ref 12.0–15.0)
Lymphocytes Relative: 19 %
Lymphs Abs: 2.1 10*3/uL (ref 0.7–4.0)
MCH: 29.8 pg (ref 26.0–34.0)
MCHC: 34.5 g/dL (ref 30.0–36.0)
MCV: 86.3 fL (ref 78.0–100.0)
METAMYELOCYTES PCT: 0 %
MONO ABS: 0.6 10*3/uL (ref 0.1–1.0)
MYELOCYTES: 0 %
Monocytes Relative: 5 %
NEUTROS PCT: 66 %
NRBC: 10 /100{WBCs} — AB
Neutro Abs: 7.2 10*3/uL (ref 1.7–7.7)
PLATELETS: 399 10*3/uL (ref 150–400)
PROMYELOCYTES ABS: 0 %
RBC: 2.05 MIL/uL — ABNORMAL LOW (ref 3.87–5.11)
RDW: 19.3 % — ABNORMAL HIGH (ref 11.5–15.5)
WBC: 11 10*3/uL — ABNORMAL HIGH (ref 4.0–10.5)

## 2016-05-23 LAB — RETICULOCYTES
RBC.: 2.03 MIL/uL — AB (ref 3.87–5.11)
RETIC COUNT ABSOLUTE: 280.1 10*3/uL — AB (ref 19.0–186.0)
RETIC CT PCT: 13.8 % — AB (ref 0.4–3.1)

## 2016-05-23 MED ORDER — MEDROXYPROGESTERONE ACETATE 150 MG/ML IM SUSP
150.0000 mg | Freq: Once | INTRAMUSCULAR | Status: AC
  Administered 2016-05-23: 150 mg via INTRAMUSCULAR
  Filled 2016-05-23: qty 1

## 2016-05-23 NOTE — Progress Notes (Signed)
Ambulating in hallway oxygen saturation ranged from 95%-100% and HR was 105-111 bpm.

## 2016-05-23 NOTE — Progress Notes (Signed)
SICKLE CELL SERVICE PROGRESS NOTE  [name] [id number] DOB: [birth year] DOA: 05/19/2016 PCP: Angelica Chessman, MD  Assessment/Plan: Active Problems:   Anemia of chronic disease   Cocaine use   Chronic pain   Sickle cell anemia ([name])   Vasoocclusive sickle cell crisis ([place])  1. Hb SS with crisis: Pt is opiate tolerant and c/o pain in low back at 8/10. Continue PCA at current dose and continue clinician assisted doses at 2 mg bolus every 3 hours PRN. Continue Toradol and decrease IVF to [facility].  2. Leukocytosis: Likely related to crisis.No evidence of infection.  3. Anemia of Chronic Disease: Hb decrease to 6.1 g/dL. This is likely due to acute blood loss with menses and hemo-dilution. Will decrease IVF and give Depo-provera to abort menses. Repeat Hb tomorrow. 4. Acute Blood Loss: Secondary to menses. Will give Depo-Provera to abort menses. If still bleeding tomorrow will give premarin IV. She will need to follow up with Gyn as an out-patient. 5. Chronic pain: Continue MS Contin.  6. Substance Abuse: Pt now a habitual Cocaine user and is requesting placement in drug rehab. SW consult generated.  Code Status: Full Code Family Communication: Mother at bedside and updated per patients request. Disposition Plan: Not yet ready for discharge  [place].  Pager [phone]. If 7PM-7AM, please contact night-coverage.  05/23/2016, 5:17 PM  LOS: 4 days   Interim History: Pt reports pain in bones at intensity of 8/10. The pain is localized to BLE's, low back and arms. She has used 33 mg with 45/35:demands/edliveries in the last 24 hours. Pt also started on her Menses today and has used 7 sanitary pads today. Pt also verbalizes an expectation of being discharged to [facility] from the hospital. However she has not at any time expressed a desire for rehab and assumed that the arrangements that she has made with the SW at the [facility]) would somehow be conveyed to the  hospital personal and Providers. I have spoken with Dr. Doreene Burke who rounded on patient during the weekend and he also was unaware of this expectation and reports that his discussion with the patient  was that he would see her at her clinic appointment tomorrow (expectation was discharge today) as she needs a letter of medical clearenace from him and along with the SW they would plan for her entry into rehab. Pt denies that understanding but did verbalize that she was expected to keep her appointment tomorrow in the clinic.    Consultants:  SW for drug rehab placement  Procedures:  None  Antibiotics:  None   Objective: Vitals:   05/23/16 1353 05/23/16 1430 05/23/16 1646 05/23/16 1647  BP:  94/78    Pulse:  90 (!) 111   Resp: 16 12  13   Temp:  98 F (36.7 C)    TempSrc:  Oral    SpO2: 99% 94% 99% 95%  Weight:      Height:       Weight change: 0 kg (0 lb)  Intake/Output Summary (Last 24 hours) at 05/23/16 1717 Last data filed at 05/23/16 1430  Gross per 24 hour  Intake              240 ml  Output             3100 ml  Net            -2860 ml    General: Alert, awake, oriented x3, in moderate distress.  HEENT: [place]/AT  PEERL, EOMI, anicteric. Neck: Trachea midline,  no masses, no thyromegal,y no JVD, no carotid bruit OROPHARYNX:  Moist, No exudate/ erythema/lesions.  Heart: Regular rate and rhythm, without murmurs, rubs, gallops, PMI non-displaced, no heaves or thrills on palpation.  Lungs: Clear to auscultation, no wheezing or rhonchi noted. No increased vocal fremitus resonant to percussion  Abdomen: Soft, nontender, nondistended, positive bowel sounds, no masses no hepatosplenomegaly noted..  Neuro: No focal neurological deficits noted cranial nerves II through XII grossly intact.  Strength at functional baseline in bilateral upper and lower extremities. Musculoskeletal: No warmth swelling or erythema around joints, no spinal tenderness noted. Psychiatric: Patient alert and  oriented x3, good insight and cognition, good recent to remote recall.    Data Reviewed: Basic Metabolic Panel:  Recent Labs Lab 05/19/16 1455 05/23/16 0800  NA 137 138  K 3.1* 3.7  CL 105 104  CO2 24 30  GLUCOSE 96 98  BUN 5* 6  CREATININE 0.58 0.55  CALCIUM 8.8* 8.0*   Liver Function Tests:  Recent Labs Lab 05/19/16 1455  AST 14*  ALT 12*  ALKPHOS 58  BILITOT 3.1*  PROT 6.9  ALBUMIN 4.6   No results for input(s): LIPASE, AMYLASE in the last 168 hours. No results for input(s): AMMONIA in the last 168 hours. CBC:  Recent Labs Lab 05/19/16 1455 05/20/16 0625 05/23/16 0800  WBC 11.6* 15.8* 11.0*  NEUTROABS 7.5 9.0* 7.2  HGB 9.7* 8.3* 6.1*  HCT 27.9* 23.4* 17.7*  MCV 84.0 84.2 86.3  PLT 537* 450* 399   Cardiac Enzymes: No results for input(s): CKTOTAL, CKMB, CKMBINDEX, TROPONINI in the last 168 hours. BNP (last 3 results) No results for input(s): BNP in the last 8760 hours.  ProBNP (last 3 results) No results for input(s): PROBNP in the last 8760 hours.  CBG: No results for input(s): GLUCAP in the last 168 hours.  No results found for this or any previous visit (from the past 240 hour(s)).   Studies: Dg Chest 1 View  Result Date: 05/19/2016 CLINICAL DATA:  Back pain, chest pain and shortness of breath. EXAM: CHEST 1 VIEW COMPARISON:  Body CT 02/26/2016 FINDINGS: Injectable right IJ approach central venous catheter terminates in the superior vena cava. The cardiac silhouette is mildly enlarged. Mediastinal contours appear intact. There is no evidence of focal airspace consolidation, pleural effusion or pneumothorax. Osseous structures are without acute abnormality. 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 Soft tissues are grossly normal. IMPRESSION: Mildly enlarged cardiac silhouette, otherwise no evidence of acute disease. Electronically Signed   By: Fidela Salisbury M.D.   On: 05/19/2016 20:02   Dg Shoulder  Left  Result Date: 05/22/2016 CLINICAL DATA:  26 year old who fell out of her bed and injured the left shoulder. Initial encounter. Current history of sickle cell disease. EXAM: LEFT SHOULDER - 2+ VIEW COMPARISON:  Left humerus x-rays 04/17/2016. Left shoulder x-rays 05/06/2012, 12/11/2011. FINDINGS: Examination was performed portably, in the axial images therefore less than optimal. No evidence of acute fracture or glenohumeral dislocation. Acromioclavicular joint intact. Osteonecrosis involving the humeral head is again noted, and there is no evidence of osteochondral fracture. IMPRESSION: No acute osseous abnormality. 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 Electronically Signed   By: Evangeline Dakin M.D.   On: 05/22/2016 16:37    Scheduled Meds: . DULoxetine  30 mg Oral Daily  . HYDROmorphone   Intravenous Q4H  . hydroxyurea  1,000 mg Oral Daily  . ketorolac  30 mg  Intravenous Q6H  . medroxyPROGESTERone  150 mg Intramuscular Once  . morphine  30 mg Oral Q12H  . rivaroxaban  20 mg Oral Q supper  . senna-docusate  1 tablet Oral BID  . sodium chloride flush  10-40 mL Intracatheter Q12H   Continuous Infusions: . dextrose 5 % and 0.45% NaCl 1,000 mL (05/23/16 1140)    Active Problems:   Anemia of chronic disease   Cocaine use   Chronic pain   Sickle cell anemia ([name])   Vasoocclusive sickle cell crisis ([name])    In excess of 40 minutes spent during this visit. Greater than 50% involved face to face contact with the patient for assessment, counseling and coordination of care.

## 2016-05-23 NOTE — Progress Notes (Signed)
CRITICAL VALUE ALERT  Critical value received:  hgb 6.1  Date of notification:  05/23/16  Time of notification:  0910  Critical value read back:Yes.    Nurse who received alert:  **[place]ers  MD notified (1st page):  Dr. Zigmund Daniel  Time of first page:  0915  MD notified (2nd page):  Time of second page:  Responding MD:  Dr,Mathews  Time MD responded:  [id number]

## 2016-05-23 NOTE — Progress Notes (Signed)
Endorsed to [employer].

## 2016-05-23 NOTE — Care Management Important Message (Signed)
Important Message  Patient Details  Name: [name] MRN: [id number] Date of Birth: November 16, 1990   Medicare Important Message Given:  Yes    Kerin Salen 05/23/2016, 2:40 [place] Message  Patient Details  Name: [name] MRN: [id number] Date of Birth: October 19, 1990   Medicare Important Message Given:  Yes    Kerin Salen 05/23/2016, 2:40 PM

## 2016-05-23 NOTE — Progress Notes (Signed)
Dilaudid PCA Syringe was empty. Syringe discarded in sharps container. Second RN witness Nancy Marus RN

## 2016-05-24 ENCOUNTER — Ambulatory Visit: Payer: Self-pay | Admitting: Internal Medicine

## 2016-05-24 ENCOUNTER — Encounter: Payer: Self-pay | Admitting: Internal Medicine

## 2016-05-24 DIAGNOSIS — F1410 Cocaine abuse, uncomplicated: Secondary | ICD-10-CM | POA: Diagnosis not present

## 2016-05-24 DIAGNOSIS — D638 Anemia in other chronic diseases classified elsewhere: Secondary | ICD-10-CM | POA: Diagnosis not present

## 2016-05-24 DIAGNOSIS — G8929 Other chronic pain: Secondary | ICD-10-CM | POA: Diagnosis not present

## 2016-05-24 DIAGNOSIS — M549 Dorsalgia, unspecified: Secondary | ICD-10-CM | POA: Diagnosis not present

## 2016-05-24 DIAGNOSIS — D5700 Hb-SS disease with crisis, unspecified: Secondary | ICD-10-CM | POA: Diagnosis not present

## 2016-05-24 LAB — RETICULOCYTES
RBC.: 2.22 MIL/uL — ABNORMAL LOW (ref 3.87–5.11)
Retic Count, Absolute: 421.8 10*3/uL — ABNORMAL HIGH (ref 19.0–186.0)
Retic Ct Pct: 19 % — ABNORMAL HIGH (ref 0.4–3.1)

## 2016-05-24 LAB — CBC WITH DIFFERENTIAL/PLATELET
BASOS PCT: 0 %
Basophils Absolute: 0 10*3/uL (ref 0.0–0.1)
Eosinophils Absolute: 0.5 10*3/uL (ref 0.0–0.7)
Eosinophils Relative: 6 %
HCT: 19.6 % — ABNORMAL LOW (ref 36.0–46.0)
Hemoglobin: 6.6 g/dL — CL (ref 12.0–15.0)
LYMPHS ABS: 1.9 10*3/uL (ref 0.7–4.0)
Lymphocytes Relative: 21 %
MCH: 29.7 pg (ref 26.0–34.0)
MCHC: 33.7 g/dL (ref 30.0–36.0)
MCV: 88.3 fL (ref 78.0–100.0)
MONO ABS: 0.8 10*3/uL (ref 0.1–1.0)
Monocytes Relative: 9 %
NEUTROS ABS: 5.8 10*3/uL (ref 1.7–7.7)
Neutrophils Relative %: 64 %
PLATELETS: 487 10*3/uL — AB (ref 150–400)
RBC: 2.22 MIL/uL — ABNORMAL LOW (ref 3.87–5.11)
RDW: 19.7 % — AB (ref 11.5–15.5)
WBC: 9 10*3/uL (ref 4.0–10.5)

## 2016-05-24 MED ORDER — OXYCODONE HCL 5 MG PO TABS
15.0000 mg | ORAL_TABLET | ORAL | Status: DC
  Administered 2016-05-24 – 2016-05-25 (×7): 15 mg via ORAL
  Filled 2016-05-24 (×7): qty 3

## 2016-05-24 MED ORDER — HYDROMORPHONE 1 MG/ML IV SOLN
INTRAVENOUS | Status: DC
  Administered 2016-05-24: 2.4 mg via INTRAVENOUS
  Administered 2016-05-24: 13.6 mg via INTRAVENOUS
  Administered 2016-05-25: 1.8 mg via INTRAVENOUS
  Administered 2016-05-25: 6 mg via INTRAVENOUS
  Administered 2016-05-25: 4.8 mg via INTRAVENOUS
  Administered 2016-05-25: 6.6 mg via INTRAVENOUS
  Filled 2016-05-24 (×2): qty 25

## 2016-05-24 NOTE — Progress Notes (Addendum)
CSW spoke briefly with Rosann Auerbach from [facility] this afternoon. Rosann Auerbach reports that she has the letter required by [facility] for admission. Rosann Auerbach reports that pt needs to self refer to [facility]. Unfortunately we were disconnected at that point of conversation. Message left for Rosann Auerbach to contact CSW to continue conversation regarding pt's d/c planning.  Werner Lean LCSW [id number]  15:49  CSW spoke with Rosann Auerbach from [place] to continue d/c planning. As requested by Rosann Auerbach, CSW contacted [facility] and clinicals sent to facility for review. Shayla from [facility] reports that pt must detox from narcotics prior to admission. CSW will continue to follow to assist with d/c planning needs.  Werner Lean LCSW [phone]

## 2016-05-24 NOTE — Clinical Social Work Note (Signed)
Clinical Social Work Assessment  Patient Details  Name: [name] MRN: [id number] Date of Birth: Apr 21, 1991  Date of referral:  05/24/16               Reason for consult:  Discharge Planning, Substance Use/ETOH Abuse                Permission sought to share information with:  Facility Art therapist granted to share information::  Yes, Verbal Permission Granted  Name::        Agency::     Relationship::     Contact Information:     Housing/Transportation Living arrangements for the past 2 months:  Apartment Source of Information:  Patient Patient Interpreter Needed:  None Criminal Activity/Legal Involvement Pertinent to Current Situation/Hospitalization:  No - Comment as needed Significant Relationships:  Significant Other, Other Family Members, Parents Lives with:  Minor Children Do you feel safe going back to the place where you live?  Yes Need for family participation in patient care:  No (Coment)  Care giving concerns: Ongoing SA.   Social Worker assessment / plan:  Pt hospitalized from home with sickle cell anemia. CSW consulted to assist with SA resources. CSW met with pt at bedside. Pt reports cocaine use began last November. Pt reports she began using cocaine  help deal with depression. Pt feels she is addicted to cocaine and is requesting residential treatment for SA. CSW spoke with Dr. Zigmund Daniel and was provided with sickle cell csw contact info for additional assistance with treatment plan and possible [facility] placement. CSW has left a vm for Rosann Auerbach and will continue to follow to assist with SA treatment.  Employment status:  Disabled (Comment on whether or not currently receiving Disability) Insurance information:  Medicare PT Recommendations:  Not assessed at this time Information / Referral to community resources:  Residential Substance Abuse Treatment Options, Outpatient Substance Abuse Treatment Options  Patient/Family's Response to care:  Pt  is requesting SA treatment.  Patient/Family's Understanding of and Emotional Response to Diagnosis, Current Treatment, and Prognosis:  Pt is aware of her medical status. She is motivated to seek treatment for SA.   Emotional Assessment Appearance:  Appears stated age Attitude/Demeanor/Rapport:  Other (cooperative) Affect (typically observed):  Calm, Appropriate Orientation:  Oriented to Self, Oriented to Place, Oriented to  Time, Oriented to Situation Alcohol / Substance use:  Illicit Drugs Psych involvement (Current and /or in the community):  No (Comment)  Discharge Needs  Concerns to be addressed:  Discharge Planning Concerns, Substance Abuse Concerns Readmission within the last 30 days:  No Current discharge risk:  Substance Abuse Barriers to Discharge:  No Barriers Identified   Luretha Rued, [place] 05/24/2016, 10:49 AM

## 2016-05-24 NOTE — Progress Notes (Signed)
SICKLE CELL SERVICE PROGRESS NOTE  [name] [id number] DOB: Aug 15, 1990 DOA: 05/19/2016 PCP: Angelica Chessman, MD  Assessment/Plan: Active Problems:   Anemia of chronic disease   Cocaine use   Chronic pain   Sickle cell anemia ([name])   Vasoocclusive sickle cell crisis ([place])  1. Hb SS with crisis:  Continue PCA at current dose. Schedule Oxycodone and discontinue clinician assisted doses. Toradol discontinued today. 2. Leukocytosis: resolved.  3. Anemia of Chronic Disease: Hb increased toto 6.6 g/dL since yesterday. Menses stopped this afternoon. Pt asymptomatic from anemia. 4. Acute Blood Loss: Secondary to menses. Menses aborted after Depo-Provera given. If any further bleeding will give oral Provera as she has had a DVT and is not able to receive Premarin. 5. Chronic pain: Continue MS Contin.  6. Substance Abuse: Pt seen by SW and [facility] will not accept until patient de-toxed off opiates. The plan is for her to go to a 3 day in-patient detox program and then be admitted to [facility]. If this can be arranged by tomorrow then she will be discharged ot the Detox program. If not she will be discharged home and this will be arranged in conjunction with the SW from [place] ([facility])  Code Status: Full Code Family Communication: Mother at bedside and updated per patients request. Disposition Plan: Not yet ready for discharge  [place].  Pager [phone]. If 7PM-7AM, please contact night-coverage.  05/24/2016, 4:36 PM  LOS: 5 days   Interim History: Pt reports that pain is 7/10 and localized to her lower back. Pt states that she used 7 pads today. She states that she is still bleeding however when I examined the sanitary napkin that she has been wearing for at lease 3 hours has absolutely no blood on it. Pt understands that her discharge is planned for tomorrow and the discharge will be to home unless detox can be arranged and then she will go to detox.  Pt had a BM  today.  Consultants:  SW for drug rehab placement  Procedures:  None  Antibiotics:  None   Objective: Vitals:   05/24/16 0938 05/24/16 1115 05/24/16 1158 05/24/16 1434  BP: 115/76   120/78  Pulse: 88   94  Resp: 16 15 12 16   Temp: 99 F (37.2 C)   98.8 F (37.1 C)  TempSrc: Oral   Oral  SpO2: 100% 99% 98% 98%  Weight:      Height:       Weight change: 0.318 kg (11.2 oz)  Intake/Output Summary (Last 24 hours) at 05/24/16 1636 Last data filed at 05/24/16 0938  Gross per 24 hour  Intake              240 ml  Output             1950 ml  Net            -1710 ml    General: Alert, awake, oriented x3, in no apparent distress.  HEENT: [place]/AT PEERL, EOMI, anicteric. Neck: Trachea midline,  no masses, no thyromegal,y no JVD, no carotid bruit OROPHARYNX:  Moist, No exudate/ erythema/lesions.  Heart: Regular rate and rhythm, without murmurs, rubs, gallops, PMI non-displaced, no heaves or thrills on palpation.  Lungs: Clear to auscultation, no wheezing or rhonchi noted. No increased vocal fremitus resonant to percussion. Abdomen: Soft, nontender, nondistended, positive bowel sounds, no masses no hepatosplenomegaly noted.  Neuro: No focal neurological deficits noted cranial nerves II through XII grossly intact.  Strength at functional baseline in bilateral upper and lower extremities. Musculoskeletal: No warmth swelling or erythema around joints, no spinal tenderness noted. Psychiatric: Patient alert and oriented x3, good insight and cognition, good recent to remote recall.    Data Reviewed: Basic Metabolic Panel:  Recent Labs Lab 05/19/16 1455 05/23/16 0800  NA 137 138  K 3.1* 3.7  CL 105 104  CO2 24 30  GLUCOSE 96 98  BUN 5* 6  CREATININE 0.58 0.55  CALCIUM 8.8* 8.0*   Liver Function Tests:  Recent Labs Lab 05/19/16 1455  AST 14*  ALT 12*  ALKPHOS 58  BILITOT 3.1*  PROT 6.9  ALBUMIN 4.6   No results for input(s): LIPASE, AMYLASE in the last 168  hours. No results for input(s): AMMONIA in the last 168 hours. CBC:  Recent Labs Lab 05/19/16 1455 05/20/16 0625 05/23/16 0800 05/24/16 1109  WBC 11.6* 15.8* 11.0* 9.0  NEUTROABS 7.5 9.0* 7.2 5.8  HGB 9.7* 8.3* 6.1* 6.6*  HCT 27.9* 23.4* 17.7* 19.6*  MCV 84.0 84.2 86.3 88.3  PLT 537* 450* 399 487*   Cardiac Enzymes: No results for input(s): CKTOTAL, CKMB, CKMBINDEX, TROPONINI in the last 168 hours. BNP (last 3 results) No results for input(s): BNP in the last 8760 hours.  ProBNP (last 3 results) No results for input(s): PROBNP in the last 8760 hours.  CBG: No results for input(s): GLUCAP in the last 168 hours.  No results found for this or any previous visit (from the past 240 hour(s)).   Studies: Dg Chest 1 View  Result Date: 05/19/2016 CLINICAL DATA:  Back pain, chest pain and shortness of breath. EXAM: CHEST 1 VIEW COMPARISON:  Body CT 02/26/2016 FINDINGS: Injectable right IJ approach central venous catheter terminates in the superior vena cava. The cardiac silhouette is mildly enlarged. Mediastinal contours appear intact. There is no evidence of focal airspace consolidation, pleural effusion or pneumothorax. Osseous structures are without acute abnormality. 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 Soft tissues are grossly normal. IMPRESSION: Mildly enlarged cardiac silhouette, otherwise no evidence of acute disease. Electronically Signed   By: Fidela Salisbury M.D.   On: 05/19/2016 20:02   Dg Shoulder Left  Result Date: 05/22/2016 CLINICAL DATA:  26 year old who fell out of her bed and injured the left shoulder. Initial encounter. Current history of sickle cell disease. EXAM: LEFT SHOULDER - 2+ VIEW COMPARISON:  Left humerus x-rays 04/17/2016. Left shoulder x-rays 05/06/2012, 12/11/2011. FINDINGS: Examination was performed portably, in the axial images therefore less than optimal. No evidence of acute fracture or glenohumeral  dislocation. Acromioclavicular joint intact. Osteonecrosis involving the humeral head is again noted, and there is no evidence of osteochondral fracture. IMPRESSION: No acute osseous abnormality. 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 Electronically Signed   By: Evangeline Dakin M.D.   On: 05/22/2016 16:37    Scheduled Meds: . DULoxetine  30 mg Oral Daily  . HYDROmorphone   Intravenous Q4H  . hydroxyurea  1,000 mg Oral Daily  . morphine  30 mg Oral Q12H  . rivaroxaban  20 mg Oral Q supper  . senna-docusate  1 tablet Oral BID  . sodium chloride flush  10-40 mL Intracatheter Q12H   Continuous Infusions: . dextrose 5 % and 0.45% NaCl 10 mL/hr at 05/24/16 1422    Active Problems:   Anemia of chronic disease   Cocaine use   Chronic pain   Sickle cell anemia ([name])   Vasoocclusive sickle cell crisis ([place])  In excess of 25 minutes spent during this visit. Greater than 50% involved face to face contact with the patient for assessment, counseling and coordination of care.

## 2016-05-25 DIAGNOSIS — Z7901 Long term (current) use of anticoagulants: Secondary | ICD-10-CM

## 2016-05-25 DIAGNOSIS — D638 Anemia in other chronic diseases classified elsewhere: Secondary | ICD-10-CM | POA: Diagnosis not present

## 2016-05-25 DIAGNOSIS — G8929 Other chronic pain: Secondary | ICD-10-CM | POA: Diagnosis not present

## 2016-05-25 DIAGNOSIS — D5700 Hb-SS disease with crisis, unspecified: Secondary | ICD-10-CM | POA: Diagnosis not present

## 2016-05-25 DIAGNOSIS — F1410 Cocaine abuse, uncomplicated: Secondary | ICD-10-CM | POA: Diagnosis not present

## 2016-05-25 MED ORDER — MEDROXYPROGESTERONE ACETATE 10 MG PO TABS: 10.0000 mg | ORAL_TABLET | Freq: Every day | ORAL | 0 refills | Status: DC

## 2016-05-25 MED ORDER — MEDROXYPROGESTERONE ACETATE 10 MG PO TABS
10.0000 mg | ORAL_TABLET | Freq: Every day | ORAL | Status: DC
  Administered 2016-05-25: 10 mg via ORAL
  Filled 2016-05-25: qty 1

## 2016-05-25 MED ORDER — HEPARIN SOD (PORK) LOCK FLUSH 100 UNIT/ML IV SOLN
500.0000 [IU] | Freq: Once | INTRAVENOUS | Status: AC
  Administered 2016-05-25: 500 [IU] via INTRAVENOUS
  Filled 2016-05-25: qty 5

## 2016-05-25 MED ORDER — ESTROGENS CONJUGATED 25 MG IJ SOLR: 25.0000 mg | INTRAMUSCULAR | Status: DC

## 2016-05-25 NOTE — Discharge Summary (Signed)
[name] MRN: [id number] DOB/AGE: 1990-12-26 26 y.o.  Admit date: 05/19/2016 Discharge date: 05/25/2016  Primary Care Physician:  Nancy Chessman, MD   Discharge Diagnoses:   Patient Active Problem List   Diagnosis Date Noted  . Major depression, chronic 01/06/2011    Priority: High  . Sickle cell disease ([place]) 01/08/2009    Priority: High  . Sickle cell anemia ([place]) 05/19/2016  . Vasoocclusive sickle cell crisis ([place]) 05/19/2016  . Acute deep vein thrombosis (DVT) of left upper extremity ([place]) 04/17/2016  . Post partum depression 04/03/2016  . Episode of recurrent major depressive disorder ([place])   . Sickle cell anemia with pain ([place]) 02/16/2016  . Thrombocytosis ([place]) 11/09/2015  . Hyperbilirubinemia 11/09/2015  . Pyrexia   . Narcotic dependence ([place]) 10/06/2015  . 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 High risk for intrapartum complications, antepartum 08/12/2015  . Chronic pain 08/04/2015  . Cocaine use 07/24/2015  . Herpes simplex 07/14/2015  . Anemia of chronic disease   . Leukocytosis   . Hb-SS disease without crisis ([place]) 02/26/2013  . Sickle cell pain crisis ([place]) 03/29/2012  . 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    Latex Rash      Medication List    TAKE these medications   aspirin-acetaminophen-caffeine 250-250-65 MG tablet Commonly known as:  EXCEDRIN MIGRAINE Take 2 tablets by mouth every 6 (six) hours as needed for headache.   cyclobenzaprine 10 MG tablet Commonly known as:  FLEXERIL Take 1 tablet (10 mg total) by mouth 3 (three) times daily as needed for muscle spasms.   DULoxetine 30 MG capsule Commonly known as:  CYMBALTA Take 1 capsule (30 mg total) by mouth daily.   hydroxyurea 500 MG capsule Commonly known as:  HYDREA Take 2 capsules (1,000 mg total) by mouth daily. May take with  food to minimize GI side effects.   ibuprofen 600 MG tablet Commonly known as:  ADVIL,MOTRIN Take 1 tablet (600 mg total) by mouth every 6 (six) hours as needed for mild pain or moderate pain.   medroxyPROGESTERone 10 MG tablet Commonly known as:  PROVERA Take 1 tablet (10 mg total) by mouth daily.   morphine 30 MG 12 hr tablet Commonly known as:  MS CONTIN Take 1 tablet (30 mg total) by mouth every 12 (twelve) hours.   oxyCODONE 15 MG immediate release tablet Commonly known as:  ROXICODONE Take 1 tablet (15 mg total) by mouth every 4 (four) hours as needed for pain.   Rivaroxaban 15 & 20 MG Tbpk Commonly known as:  XARELTO STARTER PACK Take as directed on package: Start with one 15mg  tablet by mouth twice a day with food. On Day 22, switch to one 20mg  tablet once a day with food.   valACYclovir 1000 MG tablet Commonly known as:  VALTREX Take 1 tablet (1,000 mg total) by mouth daily.         Consults:    SIGNIFICANT DIAGNOSTIC STUDIES:  Dg Chest 1 View  Result Date: 05/19/2016 CLINICAL DATA:  Back pain, chest pain and shortness of breath. EXAM: CHEST 1 VIEW COMPARISON:  Body CT 02/26/2016 FINDINGS: Injectable right IJ approach central venous catheter terminates in the superior vena cava. The cardiac silhouette is mildly enlarged. Mediastinal contours appear intact. There is no evidence of focal airspace consolidation, pleural effusion or pneumothorax.  Osseous structures are without acute abnormality. 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 Soft tissues are grossly normal. IMPRESSION: Mildly enlarged cardiac silhouette, otherwise no evidence of acute disease. Electronically Signed   By: Fidela Salisbury M.D.   On: 05/19/2016 20:02   Dg Shoulder Left  Result Date: 05/22/2016 CLINICAL DATA:  26 year old who fell out of her bed and injured the left shoulder. Initial encounter. Current history of sickle cell disease. EXAM: LEFT SHOULDER -  2+ VIEW COMPARISON:  Left humerus x-rays 04/17/2016. Left shoulder x-rays 05/06/2012, 12/11/2011. FINDINGS: Examination was performed portably, in the axial images therefore less than optimal. No evidence of acute fracture or glenohumeral dislocation. Acromioclavicular joint intact. Osteonecrosis involving the humeral head is again noted, and there is no evidence of osteochondral fracture. IMPRESSION: No acute osseous abnormality. 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 Electronically Signed   By: Evangeline Dakin M.D.   On: 05/22/2016 16:37      No results found for this or any previous visit (from the past 240 hour(s)).  BRIEF ADMITTING H & P:  [name] is a 26 y.o. female with medical history significant of significant for sickle cell recently discharged from the hospital on 05/02/2016 comes in complaining of back pain and bilateral lower extremity pain for the last 3 days. She relates she has been trying to control her pain at home with her regimen but it has not improved. She related has progressively gotten worse over the last 24 hours. She denies any fever cough shortness of breath.  ED Course:  In the ED a CBC was done that showed a mild leukocytosis with mild thrombocytosis, she was given several doses of IV Dilaudid in total 6 mg with no improvement in her pain   Hospital Course:  Present on Admission: . Sickle cell anemia ([place]) . Anemia of chronic disease . Chronic pain . Cocaine use . Vasoocclusive sickle cell crisis ([name])  Pt with Hb SS and habitual cocaine use was admitted with sickle cell crisis. She was managed with Dilaudid via PCA , Toradol and IVF. As her pain improved, transition was made to oral analgesics. Pt also has been having habitual cocaine use which has increased her tolerance to medications thus requiring higher doses of medications to treat her pain. During the course of her hospitalization, she started having her menses leading to acute  blood loss thus complication her hospital course. However her Hb remained high enough that she did not require a blood transfusion. She was given a dose of Depo-provera 150 mg and then started on oral provera. She was unable to receive Premarin due to a h/o DVT. At the time of discharge, her menses had slowed and she was only requiring 1-2 sanitary pads. She is to follow up with her Gyn for placement of an IUD.   With regard to her substance abuse, she is ready to go to rehab. She was accepted by [facility] but they want her to be off opiates altogether. The SW has been in contact with the SW from [place] and the plan is for arrangements to be made for a 3-day detox to be arranged by the SW from [place] [facility]) and then to be transferred to [facility] for in-patient substance rehab.  Pt was also continue on her Xarelto for h/o DVT and Cymbalta for depression    Disposition and Follow-up: Pt is discharged home in stable condition. She is to follow up in the [facility]  with her PMD-Dr. Doreene Burke,    DISCHARGE EXAM:  General: Alert, awake, oriented x3, in mild distress.  HEENT: [place]/AT PEERL, EOMI, anicteric Neck: Trachea midline, no masses, no thyromegal,y no JVD, no carotid bruit OROPHARYNX: Moist, No exudate/ erythema/lesions.  Heart: Regular rate and rhythm, without murmurs, rubs, gallops or S3. PMI non-displaced. Exam reveals no decreased pulses. Pulmonary/Chest: Normal effort. Breath sounds normal. No. Apnea. Clear to auscultation,no stridor,  no wheezing and no rhonchi noted. No respiratory distress and no tenderness noted. Abdomen: Soft, nontender, nondistended, normal bowel sounds, no masses no hepatosplenomegaly noted. No fluid wave and no ascites. There is no guarding or rebound. Neuro: Alert and oriented to person, place and time. Normal motor skills, Displays no atrophy or tremors and exhibits normal muscle tone.  No focal neurological deficits noted cranial nerves II through XII  grossly intact. No sensory deficit noted. Strength at baseline in bilateral upper and lower extremities. Gait normal. Musculoskeletal: No warmth swelling or erythema around joints, no spinal tenderness noted. Psychiatric: Patient alert and oriented x3, good insight and cognition, good recent to remote recall. Mood, memory, affect and judgement normal Lymph node survey: No cervical axillary or inguinal lymphadenopathy noted. Skin: Skin is warm and dry. No bruising, no ecchymosis and no rash noted. Pt is not diaphoretic. No erythema. No pallor   Blood pressure (!) 117/52, pulse (!) 54, temperature 99.8 F (37.7 C), temperature source Oral, resp. rate 15, height 5\' 1"  (1.549 m), weight 82.4 kg (181 lb 11.2 oz), last menstrual period 04/14/2016, SpO2 99 %, not currently breastfeeding.   Recent Labs  05/23/16 0800  NA 138  K 3.7  CL 104  CO2 30  GLUCOSE 98  BUN 6  CREATININE 0.55  CALCIUM 8.0*   No results for input(s): AST, ALT, ALKPHOS, BILITOT, PROT, ALBUMIN in the last 72 hours. No results for input(s): LIPASE, AMYLASE in the last 72 hours.  Recent Labs  05/23/16 0800 05/24/16 1109  WBC 11.0* 9.0  NEUTROABS 7.2 5.8  HGB 6.1* 6.6*  HCT 17.7* 19.6*  MCV 86.3 88.3  PLT 399 487*     Total time spent including face to face and decision making was greater than 30 minutes  Signed: Blessing Zaucha A. 05/25/2016, 10:59 AM

## 2016-05-25 NOTE — Progress Notes (Signed)
CSW assisting with d/c planning. Pt will be dc home today. CSW has left a message for sickle cell social worker that pt will return home at d/c and to assist, if possible, with detox program arrangements, as discussed on 1/9. Once detoxed pt is interested in [facility] program. Clinicals sent to [facility] 1/9. Pt has [facility] # to follow up with self referral once detox is completed.  Werner Lean LCSW [phone]

## 2016-05-25 NOTE — Progress Notes (Signed)
ANTICOAGULATION CONSULT NOTE - Follow Up  Pharmacy Consult for Xarelto Indication: DVT  Allergies  Allergen Reactions  . Food Hives and Other (See Comments)    Pt states that she is allergic to carrots.   . Latex Rash    Patient Measurements: Height: 5\' 1"  (154.9 cm) Weight: 181 lb 11.2 oz (82.4 kg) IBW/kg (Calculated) : 47.8  Vital Signs: Temp: 99.8 F (37.7 C) (01/10 1007) Temp Source: Oral (01/10 1007) BP: 117/52 (01/10 1007) Pulse Rate: 54 (01/10 1007)  Labs:  Recent Labs  05/23/16 0800 05/24/16 1109  HGB 6.1* 6.6*  HCT 17.7* 19.6*  PLT 399 487*  CREATININE 0.55  --     Estimated Creatinine Clearance: 105.4 mL/min (by C-G formula based on SCr of 0.55 mg/dL).   Medical History: Past Medical History:  Diagnosis Date  . Blood transfusion    "lots"  . Blood transfusion without reported diagnosis   . Chronic back pain    "very severe; have knot in my back; from tight muscle; take RX and exercise for it"  . Depression 01/06/2011  . Exertional dyspnea    "sometimes"  . Genital HSV   . GERD (gastroesophageal reflux disease) 02/17/2011  . Migraines 11/08/11   "@ least twice/month"  . 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  . Mood swings ([place]) 11/08/11   "I go back and forth; real bad"  . Sickle cell anemia ([name])   . Sickle cell anemia with crisis ([place])   . Trichotillomania    h/o    Medications:  Scheduled:  . DULoxetine  30 mg Oral Daily  . heparin lock flush  500 Units Intravenous Once  . HYDROmorphone   Intravenous Q4H  . hydroxyurea  1,000 mg Oral Daily  . medroxyPROGESTERone  10 mg Oral Daily  . morphine  30 mg Oral Q12H  . oxyCODONE  15 mg Oral Q4H  . rivaroxaban  20 mg Oral Q supper  . senna-docusate  1 tablet Oral BID  . sodium chloride flush  10-40 mL Intracatheter Q12H   Infusions:  . dextrose 5 % and 0.45% NaCl 10 mL/hr at 05/25/16 1107    Assessment: 26 yo female presented with Sickle cell crisis to continue patient's  Xarelto for DVT per pharmacy dosing. Per last admission, patient should have finished 15mg  BID 3 week BID dosing and started 20mg  once daily on 12/27. Baseline CBC good. Per PTA med list, patient has not had Xarelto in the past week   05/25/2016 Labs ok No signs/symptoms of bleeding  Goal of Therapy:  monitor CBC and renal function   Plan:  1) Continue Xarelto 20mg  once daily 2) Monitor bleelding, CBC and renal function    Dolly Rias RPh 05/25/2016, 12:05 PM Pager [phone]

## 2016-05-27 ENCOUNTER — Other Ambulatory Visit: Payer: Self-pay | Admitting: Family Medicine

## 2016-05-27 ENCOUNTER — Encounter (HOSPITAL_COMMUNITY): Payer: Self-pay

## 2016-05-27 ENCOUNTER — Emergency Department (HOSPITAL_COMMUNITY)
Admission: EM | Admit: 2016-05-27 | Discharge: 2016-05-27 | Disposition: A | Discharge status: Home / Self Care | Payer: Medicare Other | Source: Home / Self Care | Attending: Emergency Medicine | Admitting: Emergency Medicine

## 2016-05-27 ENCOUNTER — Non-Acute Institutional Stay (HOSPITAL_BASED_OUTPATIENT_CLINIC_OR_DEPARTMENT_OTHER)
Admission: AD | Admit: 2016-05-27 | Discharge: 2016-05-27 | Disposition: A | Discharge status: Home / Self Care | Payer: Medicare Other | Source: Ambulatory Visit | Attending: Internal Medicine | Admitting: Internal Medicine

## 2016-05-27 ENCOUNTER — Other Ambulatory Visit: Payer: Medicare Other

## 2016-05-27 DIAGNOSIS — Z91040 Latex allergy status: Secondary | ICD-10-CM | POA: Insufficient documentation

## 2016-05-27 DIAGNOSIS — K219 Gastro-esophageal reflux disease without esophagitis: Secondary | ICD-10-CM | POA: Diagnosis not present

## 2016-05-27 DIAGNOSIS — Z79891 Long term (current) use of opiate analgesic: Secondary | ICD-10-CM | POA: Insufficient documentation

## 2016-05-27 DIAGNOSIS — D72829 Elevated white blood cell count, unspecified: Secondary | ICD-10-CM | POA: Diagnosis not present

## 2016-05-27 DIAGNOSIS — Z833 Family history of diabetes mellitus: Secondary | ICD-10-CM

## 2016-05-27 DIAGNOSIS — F1120 Opioid dependence, uncomplicated: Secondary | ICD-10-CM | POA: Diagnosis not present

## 2016-05-27 DIAGNOSIS — Z87891 Personal history of nicotine dependence: Secondary | ICD-10-CM

## 2016-05-27 DIAGNOSIS — Z7982 Long term (current) use of aspirin: Secondary | ICD-10-CM | POA: Insufficient documentation

## 2016-05-27 DIAGNOSIS — Z79899 Other long term (current) drug therapy: Secondary | ICD-10-CM

## 2016-05-27 DIAGNOSIS — M545 Low back pain: Secondary | ICD-10-CM | POA: Diagnosis present

## 2016-05-27 DIAGNOSIS — D638 Anemia in other chronic diseases classified elsewhere: Secondary | ICD-10-CM | POA: Diagnosis present

## 2016-05-27 DIAGNOSIS — D5700 Hb-SS disease with crisis, unspecified: Principal | ICD-10-CM | POA: Diagnosis present

## 2016-05-27 DIAGNOSIS — Z7901 Long term (current) use of anticoagulants: Secondary | ICD-10-CM

## 2016-05-27 DIAGNOSIS — Z8249 Family history of ischemic heart disease and other diseases of the circulatory system: Secondary | ICD-10-CM

## 2016-05-27 DIAGNOSIS — Z86718 Personal history of other venous thrombosis and embolism: Secondary | ICD-10-CM

## 2016-05-27 DIAGNOSIS — Z91018 Allergy to other foods: Secondary | ICD-10-CM

## 2016-05-27 DIAGNOSIS — G8929 Other chronic pain: Secondary | ICD-10-CM | POA: Diagnosis not present

## 2016-05-27 DIAGNOSIS — G894 Chronic pain syndrome: Secondary | ICD-10-CM

## 2016-05-27 DIAGNOSIS — F1490 Cocaine use, unspecified, uncomplicated: Secondary | ICD-10-CM | POA: Diagnosis not present

## 2016-05-27 DIAGNOSIS — I82622 Acute embolism and thrombosis of deep veins of left upper extremity: Secondary | ICD-10-CM | POA: Diagnosis not present

## 2016-05-27 DIAGNOSIS — F329 Major depressive disorder, single episode, unspecified: Secondary | ICD-10-CM | POA: Insufficient documentation

## 2016-05-27 DIAGNOSIS — R05 Cough: Secondary | ICD-10-CM | POA: Diagnosis not present

## 2016-05-27 LAB — CBC WITH DIFFERENTIAL/PLATELET
BASOS ABS: 0.1 10*3/uL (ref 0.0–0.1)
Basophils Relative: 1 %
EOS ABS: 0.2 10*3/uL (ref 0.0–0.7)
Eosinophils Relative: 2 %
HCT: 24 % — ABNORMAL LOW (ref 36.0–46.0)
Hemoglobin: 8 g/dL — ABNORMAL LOW (ref 12.0–15.0)
LYMPHS PCT: 25 %
Lymphs Abs: 2.5 10*3/uL (ref 0.7–4.0)
MCH: 30.7 pg (ref 26.0–34.0)
MCHC: 33.3 g/dL (ref 30.0–36.0)
MCV: 92 fL (ref 78.0–100.0)
MONOS PCT: 12 %
Monocytes Absolute: 1.2 10*3/uL — ABNORMAL HIGH (ref 0.1–1.0)
NEUTROS PCT: 60 %
Neutro Abs: 6 10*3/uL (ref 1.7–7.7)
PLATELETS: 519 10*3/uL — AB (ref 150–400)
RBC: 2.61 MIL/uL — AB (ref 3.87–5.11)
RDW: 22.6 % — ABNORMAL HIGH (ref 11.5–15.5)
WBC: 10 10*3/uL (ref 4.0–10.5)
nRBC: 22 /100 WBC — ABNORMAL HIGH

## 2016-05-27 LAB — COMPREHENSIVE METABOLIC PANEL
ALT: 35 U/L (ref 14–54)
ANION GAP: 6 (ref 5–15)
AST: 23 U/L (ref 15–41)
Albumin: 4.3 g/dL (ref 3.5–5.0)
Alkaline Phosphatase: 55 U/L (ref 38–126)
BILIRUBIN TOTAL: 1.6 mg/dL — AB (ref 0.3–1.2)
BUN: 6 mg/dL (ref 6–20)
CHLORIDE: 107 mmol/L (ref 101–111)
CO2: 25 mmol/L (ref 22–32)
Calcium: 8.9 mg/dL (ref 8.9–10.3)
Creatinine, Ser: 0.54 mg/dL (ref 0.44–1.00)
Glucose, Bld: 101 mg/dL — ABNORMAL HIGH (ref 65–99)
POTASSIUM: 3.4 mmol/L — AB (ref 3.5–5.1)
Sodium: 138 mmol/L (ref 135–145)
TOTAL PROTEIN: 7.3 g/dL (ref 6.5–8.1)

## 2016-05-27 LAB — RAPID URINE DRUG SCREEN, HOSP PERFORMED
Amphetamines: NOT DETECTED
BARBITURATES: NOT DETECTED
Benzodiazepines: NOT DETECTED
COCAINE: NOT DETECTED
OPIATES: POSITIVE — AB
TETRAHYDROCANNABINOL: NOT DETECTED

## 2016-05-27 LAB — RETICULOCYTES: RBC.: 2.61 MIL/uL — AB (ref 3.87–5.11)

## 2016-05-27 MED ORDER — HYDROMORPHONE HCL 2 MG/ML IJ SOLN: 2.0000 mg | INTRAMUSCULAR | Status: AC

## 2016-05-27 MED ORDER — HYDROMORPHONE HCL 2 MG/ML IJ SOLN: 2.0000 mg | INTRAMUSCULAR | Status: DC

## 2016-05-27 MED ORDER — NALOXONE HCL 0.4 MG/ML IJ SOLN: 0.4000 mg | INTRAMUSCULAR | Status: DC | PRN

## 2016-05-27 MED ORDER — KETOROLAC TROMETHAMINE 15 MG/ML IJ SOLN
15.0000 mg | INTRAMUSCULAR | Status: AC
  Administered 2016-05-27: 15 mg via INTRAVENOUS
  Filled 2016-05-27: qty 1

## 2016-05-27 MED ORDER — DIPHENHYDRAMINE HCL 50 MG/ML IJ SOLN
25.0000 mg | Freq: Once | INTRAMUSCULAR | Status: AC
  Administered 2016-05-27: 25 mg via INTRAVENOUS
  Filled 2016-05-27: qty 1

## 2016-05-27 MED ORDER — SODIUM CHLORIDE 0.45 % IV SOLN
INTRAVENOUS | Status: DC
  Administered 2016-05-27: 09:00:00 via INTRAVENOUS

## 2016-05-27 MED ORDER — DIPHENHYDRAMINE HCL 25 MG PO CAPS: 25.0000 mg | ORAL_CAPSULE | ORAL | Status: DC | PRN

## 2016-05-27 MED ORDER — SODIUM CHLORIDE 0.9% FLUSH: 9.0000 mL | INTRAVENOUS | Status: DC | PRN

## 2016-05-27 MED ORDER — HYDROMORPHONE HCL 2 MG/ML IJ SOLN
2.0000 mg | INTRAMUSCULAR | Status: AC
  Administered 2016-05-27: 2 mg via INTRAVENOUS
  Filled 2016-05-27: qty 1

## 2016-05-27 MED ORDER — SODIUM CHLORIDE 0.9 % IV SOLN
25.0000 mg | INTRAVENOUS | Status: DC | PRN
  Filled 2016-05-27: qty 0.5

## 2016-05-27 MED ORDER — HEPARIN SOD (PORK) LOCK FLUSH 100 UNIT/ML IV SOLN
500.0000 [IU] | INTRAVENOUS | Status: DC | PRN
  Filled 2016-05-27: qty 5

## 2016-05-27 MED ORDER — SODIUM CHLORIDE 0.9% FLUSH
10.0000 mL | INTRAVENOUS | Status: AC | PRN
  Administered 2016-05-27: 10 mL

## 2016-05-27 MED ORDER — HYDROMORPHONE 1 MG/ML IV SOLN
INTRAVENOUS | Status: DC
  Administered 2016-05-27: 12:00:00 via INTRAVENOUS
  Administered 2016-05-27: 8.4 mg via INTRAVENOUS
  Filled 2016-05-27: qty 25

## 2016-05-27 MED ORDER — DEXTROSE-NACL 5-0.45 % IV SOLN
INTRAVENOUS | Status: DC
  Administered 2016-05-27: 12:00:00 via INTRAVENOUS

## 2016-05-27 MED ORDER — HEPARIN SOD (PORK) LOCK FLUSH 100 UNIT/ML IV SOLN: 500.0000 [IU] | Freq: Once | INTRAVENOUS | Status: DC

## 2016-05-27 MED ORDER — HYDROMORPHONE HCL 1 MG/ML IJ SOLN
0.5000 mg | Freq: Once | INTRAMUSCULAR | Status: AC
  Administered 2016-05-27: 0.5 mg via SUBCUTANEOUS
  Filled 2016-05-27: qty 1

## 2016-05-27 MED ORDER — HEPARIN SOD (PORK) LOCK FLUSH 100 UNIT/ML IV SOLN
500.0000 [IU] | INTRAVENOUS | Status: AC | PRN
  Administered 2016-05-27: 500 [IU]

## 2016-05-27 MED ORDER — ONDANSETRON HCL 4 MG/2ML IJ SOLN: 4.0000 mg | Freq: Four times a day (QID) | INTRAMUSCULAR | Status: DC | PRN

## 2016-05-27 MED ORDER — ONDANSETRON HCL 4 MG/2ML IJ SOLN
4.0000 mg | INTRAMUSCULAR | Status: DC | PRN
  Administered 2016-05-27: 4 mg via INTRAVENOUS
  Filled 2016-05-27: qty 2

## 2016-05-27 MED ORDER — SODIUM CHLORIDE 0.9% FLUSH: 10.0000 mL | INTRAVENOUS | Status: DC | PRN

## 2016-05-27 NOTE — Progress Notes (Signed)
Pt was admitted to the [facility] for treatment from the ER. Pt was drowsy on admitted but easy to orient. Pt was treated with IV fluids and Dilaudid PCA. Pt's pain was 8/10 on admission and down to 6/10 at discharge. Pt was alert,oriened and ambulatory at discharge. Discharge instructions given with verbal understanding. An appointment was scheduled for Feb. 13, 2018.

## 2016-05-27 NOTE — ED Triage Notes (Signed)
Pt states that she is experiencing sickle cell pain mostly in her back, but she states it hurts all over. She states that she has not taken anything for pain this morning. Denies SOB, chest pain, or N/V. A&Ox4. Ambulatory.

## 2016-05-27 NOTE — Discharge Summary (Signed)
[facility] Discharge Summary   Patient ID: [name] MRN: [id number] DOB/AGE: 25/20/1992 25 y.o.  Admit date: 05/27/2016 Discharge date: 05/27/2016  Primary Care Physician:  Angelica Chessman, MD  Admission Diagnoses:  Active Problems:   Sickle cell pain crisis [facility])   Discharge Medications:  No current facility-administered medications on file prior to encounter.    Current Outpatient Prescriptions on File Prior to Encounter  Medication Sig Dispense Refill  . aspirin-acetaminophen-caffeine (EXCEDRIN MIGRAINE) 250-250-65 MG tablet Take 2 tablets by mouth every 6 (six) hours as needed for headache.     . cyclobenzaprine (FLEXERIL) 10 MG tablet Take 1 tablet (10 mg total) by mouth 3 (three) times daily as needed for muscle spasms. 60 tablet 0  . DULoxetine (CYMBALTA) 30 MG capsule Take 1 capsule (30 mg total) by mouth daily. (Patient not taking: Reported on 05/27/2016)    . hydroxyurea (HYDREA) 500 MG capsule Take 2 capsules (1,000 mg total) by mouth daily. May take with food to minimize GI side effects. 180 capsule 3  . ibuprofen (ADVIL,MOTRIN) 600 MG tablet Take 1 tablet (600 mg total) by mouth every 6 (six) hours as needed for mild pain or moderate pain. 60 tablet 1  . medroxyPROGESTERone (PROVERA) 10 MG tablet Take 1 tablet (10 mg total) by mouth daily. (Patient not taking: Reported on 05/27/2016) 9 tablet 0  . morphine (MS CONTIN) 30 MG 12 hr tablet Take 1 tablet (30 mg total) by mouth every 12 (twelve) hours. 60 tablet 0  . oxyCODONE (ROXICODONE) 15 MG immediate release tablet Take 1 tablet (15 mg total) by mouth every 4 (four) hours as needed for pain. 90 tablet 0  . Rivaroxaban (XARELTO STARTER PACK) 15 & 20 MG TBPK Take as directed on package: Start with one 15mg  tablet by mouth twice a day with food. On Day 22, switch to one 20mg  tablet once a day with food. (Patient taking differently: Take 20 mg by mouth daily. ) 51 each 0  . valACYclovir (VALTREX) 1000 MG  tablet Take 1 tablet (1,000 mg total) by mouth daily. (Patient taking differently: Take 1,000 mg by mouth daily as needed (cold sores). ) 30 tablet 1  . [DISCONTINUED] medroxyPROGESTERone (DEPO-PROVERA) 150 MG/ML injection Inject 150 mg into the muscle every 3 (three) months. Last dose 04/20/11       Consults:  None  Significant Diagnostic Studies:  Dg Chest 1 View  Result Date: 05/19/2016 CLINICAL DATA:  Back pain, chest pain and shortness of breath. EXAM: CHEST 1 VIEW COMPARISON:  Body CT 02/26/2016 FINDINGS: Injectable right IJ approach central venous catheter terminates in the superior vena cava. The cardiac silhouette is mildly enlarged. Mediastinal contours appear intact. There is no evidence of focal airspace consolidation, pleural effusion or pneumothorax. Osseous structures are without acute abnormality. 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 Soft tissues are grossly normal. IMPRESSION: Mildly enlarged cardiac silhouette, otherwise no evidence of acute disease. Electronically Signed   By: Fidela Salisbury M.D.   On: 05/19/2016 20:02   Dg Shoulder Left  Result Date: 05/22/2016 CLINICAL DATA:  26 year old who fell out of her bed and injured the left shoulder. Initial encounter. Current history of sickle cell disease. EXAM: LEFT SHOULDER - 2+ VIEW COMPARISON:  Left humerus x-rays 04/17/2016. Left shoulder x-rays 05/06/2012, 12/11/2011. FINDINGS: Examination was performed portably, in the axial images therefore less than optimal. No evidence of acute fracture or glenohumeral dislocation. Acromioclavicular joint intact. Osteonecrosis involving the humeral head  is again noted, and there is no evidence of osteochondral fracture. IMPRESSION: No acute osseous abnormality. 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 Electronically Signed   By: Evangeline Dakin M.D.   On: 05/22/2016 16:37     [facility] Course: [name], a 26  year old female was admitted for extended observation for pain control. Patient transitioned from the emergency department in stable condition.  Pain intensity was reduced from 8/10 to Patient used a total of 8.4 mg with 14 demands and 12 deliveries.  Patient says that she is unable to provide a urine sample.  She is alert, oriented, and ambulatory  Recommend that she follow up with Dr. Doreene Burke as scheduled.   The patient was given clear instructions to go to ER or return to medical center if symptoms do not improve, worsen or new problems develop. The patient verbalized understanding.  Physical Exam at Discharge:   BP 108/64 (BP Location: Right Arm)   Pulse 98   Resp 15   LMP 04/14/2016   SpO2 100%   General Appearance:    Alert, cooperative, no distress, appears stated age  Head:    Normocephalic, without obvious abnormality, atraumatic  Eyes:    PERRL, conjunctiva/corneas clear, EOM's intact, fundi    benign, both eyes  Back:     Symmetric, no curvature, ROM normal, no CVA tenderness  Lungs:     Clear to auscultation bilaterally, respirations unlabored  Chest Wall:    No tenderness or deformity   Heart:    Regular rate and rhythm, S1 and S2 normal, no murmur, rub   or gallop  Abdomen:     Soft, non-tender, bowel sounds active all four quadrants,    no masses, no organomegaly  Extremities:   Extremities normal, atraumatic, no cyanosis or edema  Pulses:   2+ and symmetric all extremities  Skin:   Skin color, texture, turgor normal, no rashes or lesions  Lymph nodes:   Cervical, supraclavicular, and axillary nodes normal  Neurologic:   CNII-XII intact, normal strength, sensation and reflexes    throughout    Disposition at Discharge: 02-Transferred to [facility]  Discharge Orders:   Condition at Discharge:   Stable  Time spent on Discharge:  15 minutes  Signed: Maida Widger M 05/27/2016, 4:04 PM

## 2016-05-27 NOTE — H&P (Signed)
[facility] History and Physical   Date: 05/27/2016  Patient name: [name] Medical record number: [id number] Date of birth: 06-Feb-1991 Age: 26 y.o. Gender: female PCP: Angelica Chessman, MD  Attending physician: Tresa Garter, MD  Chief Complaint: Low back pain  History of Present Illness: [name], a 26 year old female with a history of sickle cell anemia, HbSS presents complaining of low back pain. Patient was transitioned from the [facility] Emergency Department. [name] attributes current sickle cell pain crisis to being out of home medications. She received Dilaudid 6 mg IV in the emergency department without sustained relief. Laboratory values are consistent with baseline. Current pain intensity is 8/10 described as constant and throbbing. [name] denies headache, shortness of breath, chest pain, nausea, vomiting, or diarrhea.   Meds: Prescriptions Prior to Admission  Medication Sig Dispense Refill Last Dose  . aspirin-acetaminophen-caffeine (EXCEDRIN MIGRAINE) 250-250-65 MG tablet Take 2 tablets by mouth every 6 (six) hours as needed for headache.    unknown  . cyclobenzaprine (FLEXERIL) 10 MG tablet Take 1 tablet (10 mg total) by mouth 3 (three) times daily as needed for muscle spasms. 60 tablet 0 05/26/2016 at Unknown time  . DULoxetine (CYMBALTA) 30 MG capsule Take 1 capsule (30 mg total) by mouth daily. (Patient not taking: Reported on 05/27/2016)   Not Taking at Unknown time  . hydroxyurea (HYDREA) 500 MG capsule Take 2 capsules (1,000 mg total) by mouth daily. May take with food to minimize GI side effects. 180 capsule 3 05/26/2016 at Unknown time  . ibuprofen (ADVIL,MOTRIN) 600 MG tablet Take 1 tablet (600 mg total) by mouth every 6 (six) hours as needed for mild pain or moderate pain. 60 tablet 1 05/26/2016 at Unknown time  . medroxyPROGESTERone (PROVERA) 10 MG tablet Take 1 tablet (10 mg total) by mouth daily. (Patient not taking:  Reported on 05/27/2016) 9 tablet 0 Not Taking at Unknown time  . morphine (MS CONTIN) 30 MG 12 hr tablet Take 1 tablet (30 mg total) by mouth every 12 (twelve) hours. 60 tablet 0 05/26/2016 at Unknown time  . oxyCODONE (ROXICODONE) 15 MG immediate release tablet Take 1 tablet (15 mg total) by mouth every 4 (four) hours as needed for pain. 90 tablet 0 05/26/2016 at Unknown time  . Rivaroxaban (XARELTO STARTER PACK) 15 & 20 MG TBPK Take as directed on package: Start with one 15mg  tablet by mouth twice a day with food. On Day 22, switch to one 20mg  tablet once a day with food. (Patient taking differently: Take 20 mg by mouth daily. ) 51 each 0 05/26/2016 at 1100  . valACYclovir (VALTREX) 1000 MG tablet Take 1 tablet (1,000 mg total) by mouth daily. (Patient taking differently: Take 1,000 mg by mouth daily as needed (cold sores). ) 30 tablet 1 unknown    Allergies: Food and Latex Past Medical History:  Diagnosis Date  . Blood transfusion    "lots"  . Blood transfusion without reported diagnosis   . Chronic back pain    "very severe; have knot in my back; from tight muscle; take RX and exercise for it"  . Depression 01/06/2011  . Exertional dyspnea    "sometimes"  . Genital HSV   . GERD (gastroesophageal reflux disease) 02/17/2011  . Migraines 11/08/11   "@ least twice/month"  . 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  . Mood swings ([place]) 11/08/11   "I go back and forth; real bad"  .  Sickle cell anemia ([name])   . Sickle cell anemia with crisis ([name])   . Trichotillomania    h/o   Past Surgical History:  Procedure Laterality Date  . CHOLECYSTECTOMY  05/2010  . DILATION AND CURETTAGE OF UTERUS  02/20/11   S/P miscarriage  . IR GENERIC HISTORICAL  12/23/2015   IR FLUORO GUIDE CV LINE RIGHT 12/23/2015 Jacqulynn Cadet, MD [facility]  . IR GENERIC HISTORICAL  12/23/2015   IR US GUIDE VASC ACCESS RIGHT 12/23/2015 Jacqulynn Cadet, MD [facility]   Family History  Problem Relation Age of  Onset  . Sickle cell trait Mother   . Sickle cell trait Father   . Diabetes Maternal Grandmother   . Diabetes Paternal Grandmother   . Hypertension Paternal Grandmother   . Diabetes Maternal Grandfather    Social History   Social History  . Marital status: Single    Spouse name: N/A  . Number of children: N/A  . Years of education: N/A   Occupational History  . Not on file.   Social History Main Topics  . Smoking status: Former Smoker    Packs/day: 0.25    Years: 1.00    Types: Cigarettes    Quit date: 03/25/2013  . Smokeless tobacco: Never Used  . Alcohol use No     Comment: pt states she quit marijuan in May 2013. Rare ETOH, + cigarettes.  She is enrolled in school  . Drug use:     Types: Other-see comments, Cocaine  . Sexual activity: Yes    Birth control/ protection: None     Comment: last sex Jul 11 2015   Other Topics Concern  . 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 LMP 04/14/2016   General Appearance:    Alert, cooperative, mild distress, appears stated age  Head:    Normocephalic, without obvious abnormality, atraumatic  Eyes:    PERRL, conjunctiva/corneas clear, EOM's intact, fundi    benign, both eyes  Throat:   Lips, mucosa, and tongue normal; teeth and gums normal  Neck:   Supple,  symmetrical, trachea midline, no adenopathy;    thyroid:  no enlargement/tenderness/nodules; no carotid   bruit or JVD  Back:     Symmetric, no curvature, ROM decreased,  CVA tenderness  Lungs:     Clear to auscultation bilaterally, respirations unlabored  Chest Wall:    No tenderness or deformity   Heart:    Regular rate and rhythm, S1 and S2 normal, no murmur, rub   or gallop  Abdomen:     Soft, non-tender, bowel sounds active all four quadrants,    no masses, no organomegaly  Extremities:   Extremities normal, atraumatic, no cyanosis or edema  Pulses:   2+ and symmetric all extremities  Skin:   Skin color, texture, turgor normal, no rashes or lesions  Lymph nodes:   Cervical, supraclavicular, and axillary nodes normal  Neurologic:   CNII-XII intact, normal strength, sensation  and reflexes    throughout     Lab results:Results for orders placed or performed during the hospital encounter of 05/27/16 (from the past 24 hour(s))  Comprehensive metabolic panel     Status: Abnormal   Collection Time: 05/27/16  8:22 AM  Result Value Ref Range   Sodium 138 135 - 145 mmol/L   Potassium 3.4 (L) 3.5 - 5.1 mmol/L   Chloride 107 101 - 111 mmol/L   CO2 25 22 - 32 mmol/L   Glucose, Bld 101 (H) 65 - 99 mg/dL   BUN 6 6 - 20 mg/dL   Creatinine, Ser 0.54 0.44 - 1.00 mg/dL   Calcium 8.9 8.9 - 10.3 mg/dL   Total Protein 7.3 6.5 - 8.1 g/dL   Albumin 4.3 3.5 - 5.0 g/dL   AST 23 15 - 41 U/L   ALT 35 14 - 54 U/L   Alkaline Phosphatase 55 38 - 126 U/L   Total Bilirubin 1.6 (H) 0.3 - 1.2 mg/dL   GFR calc non Af Amer >60 >60 mL/min   GFR calc Af Amer >60 >60 mL/min   Anion gap 6 5 - 15  CBC with Differential     Status: Abnormal   Collection Time: 05/27/16  8:22 AM  Result Value Ref Range   WBC 10.0 4.0 - 10.5 K/uL   RBC 2.61 (L) 3.87 - 5.11 MIL/uL   Hemoglobin 8.0 (L) 12.0 - 15.0 g/dL   HCT 24.0 (L) 36.0 - 46.0 %   MCV 92.0 78.0 - 100.0 fL   MCH 30.7 26.0 - 34.0 pg   MCHC 33.3 30.0 - 36.0 g/dL    RDW 22.6 (H) 11.5 - 15.5 %   Platelets 519 (H) 150 - 400 K/uL   Neutrophils Relative % 60 %   Lymphocytes Relative 25 %   Monocytes Relative 12 %   Eosinophils Relative 2 %   Basophils Relative 1 %   nRBC 22 (H) 0 /100 WBC   Neutro Abs 6.0 1.7 - 7.7 K/uL   Lymphs Abs 2.5 0.7 - 4.0 K/uL   Monocytes Absolute 1.2 (H) 0.1 - 1.0 K/uL   Eosinophils Absolute 0.2 0.0 - 0.7 K/uL   Basophils Absolute 0.1 0.0 - 0.1 K/uL   RBC Morphology TARGET CELLS    Smear Review LARGE PLATELETS PRESENT   Reticulocytes     Status: Abnormal   Collection Time: 05/27/16  8:22 AM  Result Value Ref Range   Retic Ct Pct >23.0 (H) 0.4 - 3.1 %   RBC. 2.61 (L) 3.87 - 5.11 MIL/uL   Retic Count, Manual NOT CALCULATED 19.0 - 186.0 K/uL   *Note: Due to a large number of results and/or encounters for the requested time period, some results have not been displayed. A complete set of results can be found in Results Review.    Imaging results:  No results found.   Assessment & Plan:  Patient will be admitted to the day infusion center for extended observation  Start IV D5.45 for cellular rehydration at 125/hr  Start Dilaudid PCA High Concentration per weight based protocol.   Patient will be re-evaluated for pain intensity in the context of function and relationship to baseline as care progresses.  If no significant pain relief, will transfer patient to inpatient services for a higher level of care.   Reviewed labs, consistent with baseline   Fuller Makin M 05/27/2016, 12:00 PM

## 2016-05-27 NOTE — ED Provider Notes (Signed)
[facility] DEPT Provider Note   CSN: [id number] Arrival date & time: 05/27/16  0239     History   Chief Complaint Chief Complaint  Patient presents with  . Sickle Cell Pain Crisis    HPI Ashari Morreale Randle is a 26 y.o. female.  HPI Patient presents 2 days after her discharge from our affiliated facility. Patient presents with low back pain. She states that since discharge she has had ongoing back pain.  She has no home narcotics for pain control. On discharge, 2 days ago, the patient was advised to proceed to a rehabilitation facility, where she hoped to have detox. However, the patient went home. She notes that without medication her pain has become more severe, is throughout the lower back, nonradiating. She has no other new complaints, and specifically denies chest pain, fever, nausea, vomiting or other changes from her baseline condition.  Past Medical History:  Diagnosis Date  . Blood transfusion    "lots"  . Blood transfusion without reported diagnosis   . Chronic back pain    "very severe; have knot in my back; from tight muscle; take RX and exercise for it"  . Depression 01/06/2011  . Exertional dyspnea    "sometimes"  . Genital HSV   . GERD (gastroesophageal reflux disease) 02/17/2011  . Migraines 11/08/11   "@ least twice/month"  . 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  . Mood swings ([place]) 11/08/11   "I go back and forth; real bad"  . Sickle cell anemia ([name])   . Sickle cell anemia with crisis ([place])   . Trichotillomania    h/o    Patient Active Problem List   Diagnosis Date Noted  . Sickle cell anemia ([place]) 05/19/2016  . Vasoocclusive sickle cell crisis ([place]) 05/19/2016  . Acute deep vein thrombosis (DVT) of left upper extremity ([place]) 04/17/2016  . Post partum depression 04/03/2016  . 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 Sickle cell anemia with pain ([place]) 02/16/2016  . Thrombocytosis ([place]) 11/09/2015  .  Hyperbilirubinemia 11/09/2015  . Pyrexia   . Narcotic dependence ([place]) 10/06/2015  . 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 High risk for intrapartum complications, antepartum 08/12/2015  . Chronic pain 08/04/2015  . Cocaine use 07/24/2015  . Herpes simplex 07/14/2015  . Anemia of chronic disease   . Leukocytosis   . Hb-SS disease without crisis ([place]) 02/26/2013  . Sickle cell pain crisis ([place]) 03/29/2012  . 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 Sickle cell disease ([place]) 01/08/2009  . 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 CHOLECYSTECTOMY  05/2010  . DILATION AND CURETTAGE OF UTERUS  02/20/11   S/P miscarriage  . IR GENERIC HISTORICAL  12/23/2015   IR FLUORO GUIDE CV LINE RIGHT 12/23/2015 Jacqulynn Cadet, MD [facility]  . IR GENERIC HISTORICAL  12/23/2015   IR US GUIDE VASC ACCESS RIGHT 12/23/2015 Jacqulynn Cadet, MD [facility]    OB History    Gravida Para Term Preterm AB Living   3 2 2   1 2    SAB TAB Ectopic Multiple Live Births   1     0 2      Obstetric Comments   Miscarried in October 2012 at about 7 weeks       Home Medications    Prior to Admission medications   Medication Sig Start Date End Date Taking? Authorizing Provider  aspirin-acetaminophen-caffeine (EXCEDRIN MIGRAINE) [phone] MG tablet Take  2 tablets by mouth every 6 (six) hours as needed for headache.     Historical Provider, MD  cyclobenzaprine (FLEXERIL) 10 MG tablet Take 1 tablet (10 mg total) by mouth 3 (three) times daily as needed for muscle spasms. 05/03/16   Tresa Garter, MD  DULoxetine (CYMBALTA) 30 MG capsule Take 1 capsule (30 mg total) by mouth daily. Patient not taking: Reported on 05/06/2016 05/03/16   [name], MD  hydroxyurea (HYDREA) 500 MG capsule Take 2 capsules (1,000 mg total) by mouth daily. May take with food to minimize GI side effects. 04/18/16   Tresa Garter, MD  ibuprofen (ADVIL,MOTRIN) 600  MG tablet Take 1 tablet (600 mg total) by mouth every 6 (six) hours as needed for mild pain or moderate pain. 03/14/16   Tresa Garter, MD  medroxyPROGESTERone (PROVERA) 10 MG tablet Take 1 tablet (10 mg total) by mouth daily. 05/25/16   [name], MD  morphine (MS CONTIN) 30 MG 12 hr tablet Take 1 tablet (30 mg total) by mouth every 12 (twelve) hours. 05/03/16   Tresa Garter, MD  oxyCODONE (ROXICODONE) 15 MG immediate release tablet Take 1 tablet (15 mg total) by mouth every 4 (four) hours as needed for pain. 05/18/16 06/02/16  Tresa Garter, MD  Rivaroxaban (XARELTO STARTER PACK) 15 & 20 MG TBPK Take as directed on package: Start with one 15mg  tablet by mouth twice a day with food. On Day 22, switch to one 20mg  tablet once a day with food. 04/20/16   [name], MD  valACYclovir (VALTREX) 1000 MG tablet Take 1 tablet (1,000 mg total) by mouth daily. 05/02/16   [name], MD    Family History Family History  Problem Relation Age of Onset  . Sickle cell trait Mother   . Sickle cell trait Father   . Diabetes Maternal Grandmother   . Diabetes Paternal Grandmother   . Hypertension Paternal Grandmother   . Diabetes Maternal Grandfather     Social History Social History  Substance Use Topics  . Smoking status: Former Smoker    Packs/day: 0.25    Years: 1.00    Types: Cigarettes    Quit date: 03/25/2013  . Smokeless tobacco: Never Used  . Alcohol use No     Comment: pt states she quit marijuan in May 2013. Rare ETOH, + cigarettes.  She is enrolled in school     Allergies   Food and Latex   Review of Systems Review of Systems  Constitutional:       Per HPI, otherwise negative  HENT:       Per HPI, otherwise negative  Respiratory:       Per HPI, otherwise negative  Cardiovascular:       Per HPI, otherwise negative  Gastrointestinal: Negative for vomiting.  Endocrine:       Negative aside from HPI  Genitourinary:       Neg aside  from HPI   Musculoskeletal:       Per HPI, otherwise negative  Skin: Negative.   Allergic/Immunologic: Positive for immunocompromised state.  Neurological: Negative for syncope.     Physical Exam Updated Vital Signs BP 135/95   Pulse 103   Temp 99.3 F (37.4 C) (Oral)   Resp 18   LMP 04/14/2016   SpO2 98%   Physical Exam  Constitutional: She is oriented to person, place, and time. She appears well-developed and well-nourished. No distress.  HENT:  Head: Normocephalic  and atraumatic.  Eyes: Conjunctivae and EOM are normal.  Cardiovascular: Normal rate and regular rhythm.   Pulmonary/Chest: Effort normal and breath sounds normal. No stridor. No respiratory distress.  Right upper chest wall port unremarkable  Abdominal: She exhibits no distension.  Musculoskeletal: She exhibits no edema.  Neurological: She is alert and oriented to person, place, and time. No cranial nerve deficit.  Skin: Skin is warm and dry.  Psychiatric: She has a normal mood and affect.  Nursing note and vitals reviewed.    ED Treatments / Results  Labs (all labs ordered are listed, but only abnormal results are displayed) Labs Reviewed  COMPREHENSIVE METABOLIC PANEL  CBC WITH DIFFERENTIAL/PLATELET  RETICULOCYTES    EKG  EKG Interpretation None      Chart review notable for 16 emergency department visits, 9 admissions over the past 6 months, including one from this week.   Radiology No results found.  Procedures Procedures (including critical care time)  Medications Ordered in ED Medications  ketorolac (TORADOL) 15 MG/ML injection 15 mg (not administered)  0.45 % sodium chloride infusion (not administered)  HYDROmorphone (DILAUDID) injection 2 mg (not administered)    Or  HYDROmorphone (DILAUDID) injection 2 mg (not administered)  HYDROmorphone (DILAUDID) injection 2 mg (not administered)    Or  HYDROmorphone (DILAUDID) injection 2 mg (not administered)  HYDROmorphone (DILAUDID)  injection 2 mg (not administered)    Or  HYDROmorphone (DILAUDID) injection 2 mg (not administered)  HYDROmorphone (DILAUDID) injection 2 mg (not administered)    Or  HYDROmorphone (DILAUDID) injection 2 mg (not administered)  ondansetron (ZOFRAN) injection 4 mg (not administered)  diphenhydrAMINE (BENADRYL) injection 25 mg (not administered)  HYDROmorphone (DILAUDID) injection 0.5 mg (0.5 mg Subcutaneous Given 05/27/16 0304)     Initial Impression / Assessment and Plan / ED Course  I have reviewed the triage vital signs and the nursing notes.  Pertinent labs & imaging results that were available during my care of the patient were reviewed by me and considered in my medical decision making (see chart for details).  10:19 AM I d/w ss clinic and the patient will transfer there  Clinical Course    Patient with sickle cell disease presents with ongoing low back pain, and no medication. Here, no evidence for decompensated state. Patient received IV fluids, analgesics, antiemetics, antihistamines. I discussed patient's case with our sickle cell team for transfer to our sickle cell clinic for further evaluation and management.    Carmin Muskrat, MD 05/27/16 1019

## 2016-05-27 NOTE — ED Notes (Signed)
Pt upset speaking on the phone

## 2016-05-29 ENCOUNTER — Encounter (HOSPITAL_COMMUNITY): Payer: Self-pay | Admitting: Emergency Medicine

## 2016-05-29 ENCOUNTER — Inpatient Hospital Stay (HOSPITAL_COMMUNITY)
Admission: EM | Admit: 2016-05-29 | Discharge: 2016-05-31 | DRG: 812 | Disposition: A | Discharge status: Home / Self Care | Payer: Medicare Other | Attending: Internal Medicine | Admitting: Internal Medicine

## 2016-05-29 ENCOUNTER — Emergency Department (HOSPITAL_COMMUNITY): Payer: Medicare Other

## 2016-05-29 DIAGNOSIS — Z86718 Personal history of other venous thrombosis and embolism: Secondary | ICD-10-CM | POA: Diagnosis present

## 2016-05-29 DIAGNOSIS — D5700 Hb-SS disease with crisis, unspecified: Secondary | ICD-10-CM | POA: Diagnosis present

## 2016-05-29 DIAGNOSIS — R05 Cough: Secondary | ICD-10-CM | POA: Diagnosis not present

## 2016-05-29 DIAGNOSIS — I82622 Acute embolism and thrombosis of deep veins of left upper extremity: Secondary | ICD-10-CM

## 2016-05-29 DIAGNOSIS — D72829 Elevated white blood cell count, unspecified: Secondary | ICD-10-CM | POA: Diagnosis not present

## 2016-05-29 DIAGNOSIS — F1490 Cocaine use, unspecified, uncomplicated: Secondary | ICD-10-CM | POA: Diagnosis present

## 2016-05-29 DIAGNOSIS — K2970 Gastritis, unspecified, without bleeding: Secondary | ICD-10-CM | POA: Diagnosis not present

## 2016-05-29 DIAGNOSIS — F1410 Cocaine abuse, uncomplicated: Secondary | ICD-10-CM | POA: Diagnosis not present

## 2016-05-29 DIAGNOSIS — R10817 Generalized abdominal tenderness: Secondary | ICD-10-CM | POA: Diagnosis not present

## 2016-05-29 LAB — COMPREHENSIVE METABOLIC PANEL
ALK PHOS: 58 U/L (ref 38–126)
ALT: 35 U/L (ref 14–54)
ANION GAP: 7 (ref 5–15)
AST: 24 U/L (ref 15–41)
Albumin: 4.6 g/dL (ref 3.5–5.0)
BILIRUBIN TOTAL: 1.3 mg/dL — AB (ref 0.3–1.2)
BUN: 6 mg/dL (ref 6–20)
CALCIUM: 8.9 mg/dL (ref 8.9–10.3)
CO2: 23 mmol/L (ref 22–32)
Chloride: 110 mmol/L (ref 101–111)
Creatinine, Ser: 0.64 mg/dL (ref 0.44–1.00)
GFR calc non Af Amer: >60 mL/min (ref >60)
Glucose, Bld: 106 mg/dL — ABNORMAL HIGH (ref 65–99)
Potassium: 3.7 mmol/L (ref 3.5–5.1)
SODIUM: 140 mmol/L (ref 135–145)
TOTAL PROTEIN: 7.2 g/dL (ref 6.5–8.1)

## 2016-05-29 LAB — CBC WITH DIFFERENTIAL/PLATELET
BASOS ABS: 0 10*3/uL (ref 0.0–0.1)
Basophils Relative: 0 %
EOS ABS: 0.1 10*3/uL (ref 0.0–0.7)
Eosinophils Relative: 1 %
HCT: 25.4 % — ABNORMAL LOW (ref 36.0–46.0)
HEMOGLOBIN: 8.9 g/dL — AB (ref 12.0–15.0)
LYMPHS PCT: 19 %
Lymphs Abs: 2 10*3/uL (ref 0.7–4.0)
MCH: 31.8 pg (ref 26.0–34.0)
MCHC: 35 g/dL (ref 30.0–36.0)
MCV: 90.7 fL (ref 78.0–100.0)
Monocytes Absolute: 1.4 10*3/uL — ABNORMAL HIGH (ref 0.1–1.0)
Monocytes Relative: 13 %
NEUTROS PCT: 67 %
Neutro Abs: 7.1 10*3/uL (ref 1.7–7.7)
Platelets: 516 10*3/uL — ABNORMAL HIGH (ref 150–400)
RBC: 2.8 MIL/uL — AB (ref 3.87–5.11)
RDW: 21.3 % — ABNORMAL HIGH (ref 11.5–15.5)
WBC: 10.6 10*3/uL — ABNORMAL HIGH (ref 4.0–10.5)

## 2016-05-29 LAB — URINALYSIS, ROUTINE W REFLEX MICROSCOPIC
Bilirubin Urine: NEGATIVE
GLUCOSE, UA: NEGATIVE mg/dL
HGB URINE DIPSTICK: NEGATIVE
Ketones, ur: NEGATIVE mg/dL
NITRITE: NEGATIVE
Protein, ur: NEGATIVE mg/dL
SPECIFIC GRAVITY, URINE: 1.01 (ref 1.005–1.030)
pH: 7 (ref 5.0–8.0)

## 2016-05-29 LAB — INFLUENZA PANEL BY PCR (TYPE A & B)
INFLBPCR: NEGATIVE
Influenza A By PCR: NEGATIVE

## 2016-05-29 LAB — RETICULOCYTES
RBC.: 2.8 MIL/uL — AB (ref 3.87–5.11)
RETIC CT PCT: 19.4 % — AB (ref 0.4–3.1)
Retic Count, Absolute: 543.2 10*3/uL — ABNORMAL HIGH (ref 19.0–186.0)

## 2016-05-29 LAB — RAPID URINE DRUG SCREEN, HOSP PERFORMED
AMPHETAMINES: NOT DETECTED
BARBITURATES: NOT DETECTED
Benzodiazepines: NOT DETECTED
Cocaine: NOT DETECTED
Opiates: POSITIVE — AB
Tetrahydrocannabinol: NOT DETECTED

## 2016-05-29 MED ORDER — VALACYCLOVIR HCL 500 MG PO TABS: 1000.0000 mg | ORAL_TABLET | Freq: Every day | ORAL | Status: DC | PRN

## 2016-05-29 MED ORDER — DIPHENHYDRAMINE HCL 25 MG PO TABS
25.0000 mg | ORAL_TABLET | Freq: Every evening | ORAL | Status: DC | PRN
  Filled 2016-05-29: qty 1

## 2016-05-29 MED ORDER — HYDROMORPHONE HCL 2 MG/ML IJ SOLN
2.0000 mg | INTRAMUSCULAR | Status: AC
Start: 2016-05-29 — End: 2016-05-29
  Administered 2016-05-29: 2 mg via INTRAVENOUS
  Filled 2016-05-29: qty 1

## 2016-05-29 MED ORDER — HYDROMORPHONE HCL 2 MG/ML IJ SOLN: 2.0000 mg | INTRAMUSCULAR | Status: AC

## 2016-05-29 MED ORDER — SODIUM CHLORIDE 0.9% FLUSH: 9.0000 mL | INTRAVENOUS | Status: DC | PRN

## 2016-05-29 MED ORDER — DM-GUAIFENESIN ER 30-600 MG PO TB12
1.0000 | ORAL_TABLET | Freq: Two times a day (BID) | ORAL | Status: DC
  Administered 2016-05-29 – 2016-05-30 (×2): 1 via ORAL
  Filled 2016-05-29 (×2): qty 1

## 2016-05-29 MED ORDER — HYDROMORPHONE HCL 1 MG/ML IJ SOLN
0.5000 mg | Freq: Once | INTRAMUSCULAR | Status: AC
  Administered 2016-05-29: 0.5 mg via SUBCUTANEOUS
  Filled 2016-05-29: qty 1

## 2016-05-29 MED ORDER — ONDANSETRON HCL 4 MG/2ML IJ SOLN
4.0000 mg | INTRAMUSCULAR | Status: DC | PRN
  Administered 2016-05-29 (×2): 4 mg via INTRAVENOUS
  Filled 2016-05-29: qty 2

## 2016-05-29 MED ORDER — HYDROMORPHONE HCL 2 MG/ML IJ SOLN
2.0000 mg | INTRAMUSCULAR | Status: DC | PRN
  Administered 2016-05-29 – 2016-05-30 (×5): 2 mg via INTRAVENOUS
  Filled 2016-05-29 (×5): qty 1

## 2016-05-29 MED ORDER — KETOROLAC TROMETHAMINE 15 MG/ML IJ SOLN
15.0000 mg | Freq: Four times a day (QID) | INTRAMUSCULAR | Status: DC
  Administered 2016-05-29 – 2016-05-30 (×2): 15 mg via INTRAVENOUS
  Filled 2016-05-29 (×2): qty 1

## 2016-05-29 MED ORDER — NALOXONE HCL 0.4 MG/ML IJ SOLN: 0.4000 mg | INTRAMUSCULAR | Status: DC | PRN

## 2016-05-29 MED ORDER — DIPHENHYDRAMINE HCL 50 MG/ML IJ SOLN
25.0000 mg | Freq: Once | INTRAMUSCULAR | Status: AC
  Administered 2016-05-29: 25 mg via INTRAVENOUS
  Filled 2016-05-29: qty 1

## 2016-05-29 MED ORDER — HYDROMORPHONE HCL 2 MG/ML IJ SOLN: 2.0000 mg | INTRAMUSCULAR | Status: DC

## 2016-05-29 MED ORDER — PROMETHAZINE HCL 25 MG PO TABS: 12.5000 mg | ORAL_TABLET | ORAL | Status: DC | PRN

## 2016-05-29 MED ORDER — CYCLOBENZAPRINE HCL 10 MG PO TABS
10.0000 mg | ORAL_TABLET | Freq: Three times a day (TID) | ORAL | Status: DC | PRN
  Administered 2016-05-30: 10 mg via ORAL
  Filled 2016-05-29: qty 1

## 2016-05-29 MED ORDER — HYDROMORPHONE 1 MG/ML IV SOLN
INTRAVENOUS | Status: DC
  Administered 2016-05-29: 20:00:00 via INTRAVENOUS
  Administered 2016-05-30: 6.3 mg via INTRAVENOUS
  Administered 2016-05-30: 4.9 mg via INTRAVENOUS
  Administered 2016-05-30: 15:00:00 via INTRAVENOUS
  Filled 2016-05-29 (×2): qty 25

## 2016-05-29 MED ORDER — MORPHINE SULFATE ER 30 MG PO TBCR
30.0000 mg | EXTENDED_RELEASE_TABLET | Freq: Two times a day (BID) | ORAL | Status: DC
  Administered 2016-05-29 – 2016-05-31 (×4): 30 mg via ORAL
  Filled 2016-05-29 (×4): qty 1

## 2016-05-29 MED ORDER — POLYETHYLENE GLYCOL 3350 17 G PO PACK: 17.0000 g | PACK | Freq: Every day | ORAL | Status: DC | PRN

## 2016-05-29 MED ORDER — SODIUM CHLORIDE 0.45 % IV SOLN
INTRAVENOUS | Status: DC
  Administered 2016-05-29 – 2016-05-30 (×2): via INTRAVENOUS

## 2016-05-29 MED ORDER — HYDROMORPHONE HCL 2 MG/ML IJ SOLN
2.0000 mg | INTRAMUSCULAR | Status: AC
Start: 2016-05-29 — End: 2016-05-29

## 2016-05-29 MED ORDER — DIPHENHYDRAMINE HCL 25 MG PO CAPS: 25.0000 mg | ORAL_CAPSULE | Freq: Every evening | ORAL | Status: DC | PRN

## 2016-05-29 MED ORDER — KETOROLAC TROMETHAMINE 15 MG/ML IJ SOLN
15.0000 mg | INTRAMUSCULAR | Status: AC
  Administered 2016-05-29: 15 mg via INTRAVENOUS
  Filled 2016-05-29: qty 1

## 2016-05-29 MED ORDER — PROMETHAZINE HCL 25 MG RE SUPP: 12.5000 mg | RECTAL | Status: DC | PRN

## 2016-05-29 MED ORDER — OSELTAMIVIR PHOSPHATE 75 MG PO CAPS
75.0000 mg | ORAL_CAPSULE | Freq: Two times a day (BID) | ORAL | Status: DC
  Administered 2016-05-29: 75 mg via ORAL
  Filled 2016-05-29: qty 1

## 2016-05-29 MED ORDER — HYDROMORPHONE HCL 2 MG/ML IJ SOLN
2.0000 mg | INTRAMUSCULAR | Status: DC
  Administered 2016-05-29 (×3): 2 mg via INTRAVENOUS
  Filled 2016-05-29 (×3): qty 1

## 2016-05-29 MED ORDER — SODIUM CHLORIDE 0.9 % IV SOLN
25.0000 mg | INTRAVENOUS | Status: DC | PRN
  Administered 2016-05-29 – 2016-05-30 (×2): 25 mg via INTRAVENOUS
  Filled 2016-05-29 (×5): qty 0.5

## 2016-05-29 MED ORDER — DIPHENHYDRAMINE HCL 25 MG PO CAPS
25.0000 mg | ORAL_CAPSULE | ORAL | Status: DC | PRN
  Administered 2016-05-30: 50 mg via ORAL
  Filled 2016-05-29: qty 2

## 2016-05-29 MED ORDER — SENNOSIDES-DOCUSATE SODIUM 8.6-50 MG PO TABS
1.0000 | ORAL_TABLET | Freq: Two times a day (BID) | ORAL | Status: DC
  Administered 2016-05-29 – 2016-05-31 (×4): 1 via ORAL
  Filled 2016-05-29 (×4): qty 1

## 2016-05-29 MED ORDER — ONDANSETRON HCL 4 MG/2ML IJ SOLN: 4.0000 mg | Freq: Four times a day (QID) | INTRAMUSCULAR | Status: DC | PRN

## 2016-05-29 MED ORDER — HYDROMORPHONE HCL 2 MG/ML IJ SOLN
2.0000 mg | INTRAMUSCULAR | Status: AC
  Administered 2016-05-29: 2 mg via INTRAVENOUS
  Filled 2016-05-29: qty 1

## 2016-05-29 MED ORDER — HYDROXYUREA 500 MG PO CAPS
1000.0000 mg | ORAL_CAPSULE | Freq: Every day | ORAL | Status: DC
  Administered 2016-05-30 – 2016-05-31 (×2): 1000 mg via ORAL
  Filled 2016-05-29 (×2): qty 2

## 2016-05-29 MED ORDER — MEDROXYPROGESTERONE ACETATE 150 MG/ML IM SUSP: 150.0000 mg | INTRAMUSCULAR | Status: DC

## 2016-05-29 MED ORDER — RIVAROXABAN 20 MG PO TABS
20.0000 mg | ORAL_TABLET | ORAL | Status: DC
  Administered 2016-05-29 – 2016-05-30 (×2): 20 mg via ORAL
  Filled 2016-05-29 (×3): qty 1

## 2016-05-29 NOTE — ED Triage Notes (Signed)
Pt with Hx sickle cell anemia c/o abdominal pain and back pain onset last night, cough onset last night. Reports pain to be due to sickle cell anemia.

## 2016-05-29 NOTE — ED Provider Notes (Signed)
[facility] DE[facility] Provider Note   CSN: [id number] Arrival date & time: 05/29/16  1231     History   Chief Complaint Chief Complaint  Patient presents with  . Sickle Cell Pain Crisis  . Cough    HPI [name] is a 26 y.o. female.  HPI  26 year old female who presents with sickle cell pain crisis. History of sickle cell, chronic back pain. [facility] for pain crisis. Felt improved at discharged, but last night around MN began to have progressively worsening atraumatic low back pain consistent with typical sickle cell crisis. Believes it's the cold that exacerbates it. Took home narcotic and benadryl, w/o improved symptoms. With mild nonproductive cough for one day, but no fever, chest pain, dyspnea, sore throat, congestion, runny nose. No n/v/d. Noticed intermittent abdominal discomfort since taking narcotic yesterday on empty stomach. Normal BM yesterday.   Past Medical History:  Diagnosis Date  . Blood transfusion    "lots"  . Blood transfusion without reported diagnosis   . Chronic back pain    "very severe; have knot in my back; from tight muscle; take RX and exercise for it"  . Depression 01/06/2011  . Exertional dyspnea    "sometimes"  . Genital HSV   . GERD (gastroesophageal reflux disease) 02/17/2011  . Migraines 11/08/11   "@ least twice/month"  . 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  . Mood swings ([place]) 11/08/11   "I go back and forth; real bad"  . Sickle cell anemia ([name])   . Sickle cell anemia with crisis ([place])   . Trichotillomania    h/o    Patient Active Problem List   Diagnosis Date Noted  . Sickle cell anemia ([place]) 05/19/2016  . Vasoocclusive sickle cell crisis ([place]) 05/19/2016  . Acute deep vein thrombosis (DVT) of left upper extremity ([place]) 04/17/2016  . Post partum depression 04/03/2016  . Episode of recurrent major depressive disorder ([place])   . Sickle cell anemia with pain ([place])  02/16/2016  . Thrombocytosis ([place]) 11/09/2015  . Hyperbilirubinemia 11/09/2015  . Pyrexia   . Narcotic dependence ([place]) 10/06/2015  . 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 High risk for intrapartum complications, antepartum 08/12/2015  . Chronic pain 08/04/2015  . Cocaine use 07/24/2015  . Herpes simplex 07/14/2015  . Anemia of chronic disease   . Leukocytosis   . 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 Sickle cell pain crisis ([place]) 03/29/2012  . 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 Sickle cell disease ([place]) 01/08/2009  . 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 CHOLECYSTECTOMY  05/2010  . DILATION AND CURETTAGE OF UTERUS  02/20/11   S/P miscarriage  . IR GENERIC HISTORICAL  12/23/2015   IR FLUORO GUIDE CV LINE RIGHT 12/23/2015 [facility]nn Cadet, MD [facility]  . IR GENERIC HISTORICAL  12/23/2015   IR US GUIDE VASC ACCESS RIGHT 12/23/2015 [facility]nn Cadet, MD [facility]    OB History    Gravida Para Term Preterm AB Living   3 2 2   1 2    SAB TAB Ectopic Multiple Live Births   1     0 2      Obstetric Comments   Miscarried in October 2012 at about 7 weeks       Home Medications    Prior to Admission medications   Medication Sig Start Date End Date Taking? Authorizing Provider  aspirin-acetaminophen-caffeine (EXCEDRIN MIGRAINE) 250-250-65 MG tablet Take 2 tablets by mouth every 6 (six) hours as needed for headache.    Yes Historical Provider, MD  cyclobenzaprine (FLEXERIL) 10 MG tablet Take 1 tablet (10 mg total) by mouth 3 (three) times daily as needed for muscle spasms. 05/03/16  Yes Tresa Garter, MD  diphenhydrAMINE (BENADRYL) 25 MG tablet Take 25 mg by mouth at bedtime as needed for sleep.   Yes Historical Provider, MD  hydroxyurea (HYDREA) 500 MG capsule Take 2 capsules (1,000 mg total) by mouth daily. May take with food to minimize GI side effects. 04/18/16  Yes Tresa Garter, MD    ibuprofen (ADVIL,MOTRIN) 600 MG tablet Take 1 tablet (600 mg total) by mouth every 6 (six) hours as needed for mild pain or moderate pain. 03/14/16  Yes Tresa Garter, MD  medroxyPROGESTERone (DEPO-PROVERA) 150 MG/ML injection Inject 150 mg into the muscle every 3 (three) months.   Yes Historical Provider, MD  morphine (MS CONTIN) 30 MG 12 hr tablet Take 1 tablet (30 mg total) by mouth every 12 (twelve) hours. Patient taking differently: Take 60 mg by mouth every 12 (twelve) hours.  05/03/16  Yes Olugbemiga Essie Christine, MD  oxyCODONE (ROXICODONE) 15 MG immediate release tablet Take 1 tablet (15 mg total) by mouth every 4 (four) hours as needed for pain. 05/18/16 06/02/16 Yes Tresa Garter, MD  valACYclovir (VALTREX) 1000 MG tablet Take 1 tablet (1,000 mg total) by mouth daily. Patient taking differently: Take 1,000 mg by mouth daily as needed (cold sores).  05/02/16  Yes [name], MD  DULoxetine (CYMBALTA) 30 MG capsule Take 1 capsule (30 mg total) by mouth daily. Patient not taking: Reported on 05/29/2016 05/03/16   [name], MD  medroxyPROGESTERone (PROVERA) 10 MG tablet Take 1 tablet (10 mg total) by mouth daily. Patient not taking: Reported on 05/29/2016 05/25/16   [name], MD  Rivaroxaban (XARELTO STARTER PACK) 15 & 20 MG TBPK Take as directed on package: Start with one 15mg  tablet by mouth twice a day with food. On Day 22, switch to one 20mg  tablet once a day with food. Patient not taking: Reported on 05/29/2016 04/20/16   [name], MD    Family History Family History  Problem Relation Age of Onset  . Sickle cell trait Mother   . Sickle cell trait Father   . Diabetes Maternal Grandmother   . Diabetes Paternal Grandmother   . Hypertension Paternal Grandmother   . Diabetes Maternal Grandfather     Social History Social History  Substance Use Topics  . Smoking status: Former Smoker    Packs/day: 0.25    Years: 1.00    Types:  Cigarettes    Quit date: 03/25/2013  . Smokeless tobacco: Never Used  . Alcohol use No     Comment: pt states she quit marijuan in May 2013. Rare ETOH, + cigarettes.  She is enrolled in school     Allergies   Food and Latex   Review of Systems Review of Systems 10/14 systems reviewed and are negative other than those stated in the HPI   Physical Exam Updated Vital Signs BP 96/62   Pulse 94   Temp 98.6 F (37 C) (Oral)   Resp 16   Wt 181 lb (82.1 kg)   LMP 04/14/2016   SpO2 98%   BMI 34.20 kg/m   Physical Exam Physical Exam  Nursing note and vitals reviewed. Constitutional: Well developed, well  nourished, non-toxic, and in no acute distress Head: Normocephalic and atraumatic.  Mouth/Throat: Oropharynx is clear and moist.  Neck: Normal range of motion. Neck supple.  Cardiovascular: Normal rate and regular rhythm.   Pulmonary/Chest: Effort normal and breath sounds normal.  Abdominal: Soft. There is mild generalized tenderness. There is no rebound and no guarding.  Musculoskeletal: Normal range of motion.  Neurological: Alert, no facial droop, fluent speech, moves all extremities symmetrically Skin: Skin is warm and dry.  Psychiatric: Cooperative   ED Treatments / Results  Labs (all labs ordered are listed, but only abnormal results are displayed) Labs Reviewed  COMPREHENSIVE METABOLIC PANEL - Abnormal; Notable for the following:       Result Value   Glucose, Bld 106 (*)    Total Bilirubin 1.3 (*)    All other components within normal limits  CBC WITH DIFFERENTIAL/PLATELET - Abnormal; Notable for the following:    WBC 10.6 (*)    RBC 2.80 (*)    Hemoglobin 8.9 (*)    HCT 25.4 (*)    RDW 21.3 (*)    Platelets 516 (*)    Monocytes Absolute 1.4 (*)    All other components within normal limits  RETICULOCYTES - Abnormal; Notable for the following:    Retic Ct Pct 19.4 (*)    RBC. 2.80 (*)    Retic Count, Manual 543.2 (*)    All other components within normal  limits  URINALYSIS, ROUTINE W REFLEX MICROSCOPIC - Abnormal; Notable for the following:    Leukocytes, UA TRACE (*)    Bacteria, UA RARE (*)    Squamous Epithelial / LPF 0-5 (*)    All other components within normal limits  URINE CULTURE  LIPASE, BLOOD  INFLUENZA PANEL BY PCR (TYPE A & B, H1N1)  I-STAT BETA HCG BLOOD, ED ([facility], [facility], [facility] ONLY)    EKG  EKG Interpretation None       Radiology Dg Chest 2 View  Result Date: 05/29/2016 CLINICAL DATA:  Pt with Hx sickle cell anemia c/o abdominal pain and back pain onset last night, cough onset last night. Reports pain to be due to sickle cell anemia GERD, former smoker x 3 years ago EXAM: CHEST  2 VIEW COMPARISON:  05/19/2016 FINDINGS: Cardiac silhouette is normal in size. No mediastinal or hilar masses. No evidence of adenopathy. Clear lungs.  No pleural effusion.  No pneumothorax. Right anterior chest wall Port-A-Cath is stable with its tip in the lower superior vena cava. 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 IMPRESSION: No active cardiopulmonary disease. Electronically Signed   By: Lajean Manes M.D.   On: 05/29/2016 16:42    Procedures Procedures (including critical care time)  Medications Ordered in ED Medications  0.45 % sodium chloride infusion ( Intravenous New Bag/Given 05/29/16 1425)  HYDROmorphone (DILAUDID) injection 2 mg (2 mg Intravenous Not Given 05/29/16 1630)    Or  HYDROmorphone (DILAUDID) injection 2 mg ( Subcutaneous See Alternative 05/29/16 1630)  HYDROmorphone (DILAUDID) injection 2 mg (2 mg Intravenous Given 05/29/16 1639)    Or  HYDROmorphone (DILAUDID) injection 2 mg ( Subcutaneous See Alternative 05/29/16 1639)  ondansetron (ZOFRAN) injection 4 mg (4 mg Intravenous Given 05/29/16 1424)  diphenhydrAMINE (BENADRYL) injection 25 mg (not administered)  HYDROmorphone (DILAUDID) injection 0.5 mg (0.5 mg Subcutaneous Given 05/29/16 1350)  ketorolac (TORADOL) 15 MG/ML injection 15 mg (15  mg Intravenous Given 05/29/16 1423)  HYDROmorphone (DILAUDID) injection 2 mg (2 mg Intravenous Given 05/29/16 1424)  Or  HYDROmorphone (DILAUDID) injection 2 mg ( Subcutaneous See Alternative 05/29/16 1424)  HYDROmorphone (DILAUDID) injection 2 mg (2 mg Intravenous Given 05/29/16 1518)    Or  HYDROmorphone (DILAUDID) injection 2 mg ( Subcutaneous See Alternative 05/29/16 1518)  diphenhydrAMINE (BENADRYL) injection 25 mg (25 mg Intravenous Given 05/29/16 1423)     Initial Impression / Assessment and Plan / ED Course  I have reviewed the triage vital signs and the nursing notes.  Pertinent labs & imaging results that were available during my care of the patient were reviewed by me and considered in my medical decision making (see chart for details).  Clinical Course     Presenting with sickle cell pain crisis, which she reports is her typical crisis symptoms. Well appearing. No acute distress. Vital signs w/in normal limits. No hypoxia or tachypnea. No concerns for acute chest. Abdomen soft and benign. Back pain typical of sickle cell crisis. No fever and no concerns for acute/emergent spinal cord or spine processes.  Given toradol, dilaudid x 4, benadryl, zofran, 0.45 NS at continouous.  Still 8/10 back pain. Requesting admission. Admit to hospitalist service for pain management. Discussed with Dr. Ree Kida.  Final Clinical Impressions(s) / ED Diagnoses   Final diagnoses:  Sickle cell pain crisis [facility])    New Prescriptions New Prescriptions   No medications on file     Forde Dandy, MD 05/29/16 1724

## 2016-05-29 NOTE — H&P (Addendum)
[facility] History and Physical  [name] [id number] DOB: May 18, 1990 DOA: 05/29/2016  PCP: Angelica Chessman, MD  Patient coming from: Home  Chief Complaint: Back pain  HPI: [name] is a 26 y.o. female with a medical history of sickle cell, chronic back pain, recently discharged 05/27/2016 after being emitted for sickle cell crisis. Patient was thought to be improved at discharge and she went home and stated she was feeling okay. Patient was able to hang out with her sister over the weekend however yesterday evening patient started to feel lower back pain as well as abdominal pain, rates it as 8/10. Patient states she feels that the cold weather has exacerbated. She also feels that she has had nonproductive cough with upper respiratory symptoms. The patient has had mild chills at home. She denies any ill contacts, chest pain, shortness of breath, headache, dizziness, nausea, vomiting, diarrhea or constipation. Patient states her last bowel movement was yesterday. She did not try taking her home medications for her pain as she felt that it would be ineffective.  ED Course: Found to be in sickle cell crises.  Given multiple doses of Dilaudid, Toradol, Zofran and Benadryl. Based on IV fluids. Chest x-ray and UA unremarkable for infection. TRH for admission.  Review of Systems:  All other systems reviewed and are negative.   Past Medical History:  Diagnosis Date  . Blood transfusion    "lots"  . Blood transfusion without reported diagnosis   . Chronic back pain    "very severe; have knot in my back; from tight muscle; take RX and exercise for it"  . Depression 01/06/2011  . Exertional dyspnea    "sometimes"  . Genital HSV   . GERD (gastroesophageal reflux disease) 02/17/2011  . Migraines 11/08/11   "@ least twice/month"  . 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  . Mood swings ([place]) 11/08/11   "I go back and forth; real bad"  . Sickle cell anemia  ([name])   . Sickle cell anemia with crisis ([place])   . Trichotillomania    h/o    Past Surgical History:  Procedure Laterality Date  . CHOLECYSTECTOMY  05/2010  . DILATION AND CURETTAGE OF UTERUS  02/20/11   S/P miscarriage  . IR GENERIC HISTORICAL  12/23/2015   IR FLUORO GUIDE CV LINE RIGHT 12/23/2015 [facility] Cadet, [facility]  . IR GENERIC HISTORICAL  12/23/2015   IR US GUIDE VASC ACCESS RIGHT 12/23/2015 [facility]nn Cadet, MD [facility]    Social History:  reports that she quit smoking about 3 years ago. Her smoking use included Cigarettes. She has a 0.25 pack-year smoking history. She has never used smokeless tobacco. She reports that she uses drugs, including Other-see comments and Cocaine. She reports that she does not drink alcohol.   Allergies  Allergen Reactions  . Food Hives and Other (See Comments)    Pt states that she is allergic to carrots.   . Latex Rash    Family History  Problem Relation Age of Onset  . Sickle cell trait Mother   . Sickle cell trait Father   . Diabetes Maternal Grandmother   . Diabetes Paternal Grandmother   . Hypertension Paternal Grandmother   . Diabetes Maternal Grandfather     Prior to Admission medications   Medication Sig Start Date End Date Taking? Authorizing Provider  aspirin-acetaminophen-caffeine (EXCEDRIN MIGRAINE) [phone] MG tablet Take 2 tablets by mouth every 6 (six) hours as needed for headache.  Yes Historical Provider, MD  cyclobenzaprine (FLEXERIL) 10 MG tablet Take 1 tablet (10 mg total) by mouth 3 (three) times daily as needed for muscle spasms. 05/03/16  Yes Tresa Garter, MD  diphenhydrAMINE (BENADRYL) 25 MG tablet Take 25 mg by mouth at bedtime as needed for sleep.   Yes Historical Provider, MD  hydroxyurea (HYDREA) 500 MG capsule Take 2 capsules (1,000 mg total) by mouth daily. May take with food to minimize GI side effects. 04/18/16  Yes Tresa Garter, MD  ibuprofen (ADVIL,MOTRIN) 600 MG tablet Take 1  tablet (600 mg total) by mouth every 6 (six) hours as needed for mild pain or moderate pain. 03/14/16  Yes Tresa Garter, MD  medroxyPROGESTERone (DEPO-PROVERA) 150 MG/ML injection Inject 150 mg into the muscle every 3 (three) months.   Yes Historical Provider, MD  morphine (MS CONTIN) 30 MG 12 hr tablet Take 1 tablet (30 mg total) by mouth every 12 (twelve) hours. Patient taking differently: Take 60 mg by mouth every 12 (twelve) hours.  05/03/16  Yes Olugbemiga Essie Christine, MD  oxyCODONE (ROXICODONE) 15 MG immediate release tablet Take 1 tablet (15 mg total) by mouth every 4 (four) hours as needed for pain. 05/18/16 06/02/16 Yes Tresa Garter, MD  valACYclovir (VALTREX) 1000 MG tablet Take 1 tablet (1,000 mg total) by mouth daily. Patient taking differently: Take 1,000 mg by mouth daily as needed (cold sores).  05/02/16  Yes [name], MD    Physical Exam: Vitals:   05/29/16 1545 05/29/16 1600  BP: 101/62 96/62  Pulse: 92 94  Resp: 16   Temp:       General: Well developed, well nourished, NAD, appears stated age  HEENT: NCAT, PERRLA, EOMI, Anicteic Sclera, mucous membranes moist.   Neck: Supple, no JVD, no masses  Cardiovascular: S1 S2 auscultated, no rubs, murmurs or gallops. Regular rate and rhythm.  Respiratory: Clear to auscultation bilaterally with equal chest rise  Abdomen: Soft, mild generalized tenderness, nondistended, + bowel sounds  Extremities: warm dry without cyanosis clubbing or edema  Neuro: AAOx3, cranial nerves grossly intact. Strength 5/5 in patient's upper and lower extremities bilaterally  Skin: Without rashes exudates or nodules  Psych: Anxious, tearful  Labs on Admission: I have personally reviewed following labs and imaging studies CBC:  Recent Labs Lab 05/23/16 0800 05/24/16 1109 05/27/16 0822 05/29/16 1346  WBC 11.0* 9.0 10.0 10.6*  NEUTROABS 7.2 5.8 6.0 7.1  HGB 6.1* 6.6* 8.0* 8.9*  HCT 17.7* 19.6* 24.0* 25.4*  MCV 86.3  88.3 92.0 90.7  PLT 399 487* 519* [id number]*   Basic Metabolic Panel:  Recent Labs Lab 05/23/16 0800 05/27/16 0822 05/29/16 1346  NA 138 138 140  K 3.7 3.4* 3.7  CL 104 107 110  CO2 30 25 23   GLUCOSE 98 101* 106*  BUN 6 6 6   CREATININE 0.55 0.54 0.64  CALCIUM 8.0* 8.9 8.9   GFR: Estimated Creatinine Clearance: 105.3 mL/min (by C-G formula based on SCr of 0.64 mg/dL). Liver Function Tests:  Recent Labs Lab 05/27/16 0822 05/29/16 1346  AST 23 24  ALT 35 35  ALKPHOS 55 58  BILITOT 1.6* 1.3*  PROT 7.3 7.2  ALBUMIN 4.3 4.6   No results for input(s): LIPASE, AMYLASE in the last 168 hours. No results for input(s): AMMONIA in the last 168 hours. Coagulation Profile: No results for input(s): INR, PROTIME in the last 168 hours. Cardiac Enzymes: No results for input(s): CKTOTAL, CKMB, CKMBINDEX, TROPONINI in the last  168 hours. BNP (last 3 results) No results for input(s): PROBNP in the last 8760 hours. HbA1C: No results for input(s): HGBA1C in the last 72 hours. CBG: No results for input(s): GLUCAP in the last 168 hours. Lipid Profile: No results for input(s): CHOL, HDL, LDLCALC, TRIG, CHOLHDL, LDLDIRECT in the last 72 hours. Thyroid Function Tests: No results for input(s): TSH, T4TOTAL, FREET4, T3FREE, THYROIDAB in the last 72 hours. Anemia Panel:  Recent Labs  05/27/16 0822 05/29/16 1346  RETICCTPCT >23.0* 19.4*   Urine analysis:    Component Value Date/Time   COLORURINE YELLOW 05/29/2016 1512   APPEARANCEUR CLEAR 05/29/2016 1512   APPEARANCEUR Clear 05/06/2012 1546   LABSPEC 1.010 05/29/2016 1512   LABSPEC 1.011 05/06/2012 1546   PHURINE 7.0 05/29/2016 1512   GLUCOSEU NEGATIVE 05/29/2016 1512   GLUCOSEU Negative 05/06/2012 1546   HGBUR NEGATIVE 05/29/2016 1512   HGBUR negative 01/08/2009 1329   BILIRUBINUR NEGATIVE 05/29/2016 1512   BILIRUBINUR Negative 05/06/2012 1546   KETONESUR NEGATIVE 05/29/2016 1512   PROTEINUR NEGATIVE 05/29/2016 1512    UROBILINOGEN 2.0 (H) 10/26/2015 0817   NITRITE NEGATIVE 05/29/2016 1512   LEUKOCYTESUR TRACE (A) 05/29/2016 1512   LEUKOCYTESUR Negative 05/06/2012 1546   Sepsis Labs: @LABRCNTIP (procalcitonin:4,lacticidven:4) )No results found for this or any previous visit (from the past 240 hour(s)).   Radiological Exams on Admission: Dg Chest 2 View  Result Date: 05/29/2016 CLINICAL DATA:  Pt with Hx sickle cell anemia c/o abdominal pain and back pain onset last night, cough onset last night. Reports pain to be due to sickle cell anemia GERD, former smoker x 3 years ago EXAM: CHEST  2 VIEW COMPARISON:  05/19/2016 FINDINGS: Cardiac silhouette is normal in size. No mediastinal or hilar masses. No evidence of adenopathy. Clear lungs.  No pleural effusion.  No pneumothorax. Right anterior chest wall Port-A-Cath is stable with its tip in the lower superior vena cava. 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 IMPRESSION: No active cardiopulmonary disease. Electronically Signed   By: Lajean Manes M.D.   On: 05/29/2016 16:42    EKG: Independently reviewed. None  Assessment/Plan  Sickle cell crisis and anemia/Back pain/Abdominal pain -Hemoglobin stable at 8.9 today -order set utilized -Place patient on dilaudid PCA pump and will place on dilaudid 2mg  q3hours PRN, morphine-MS Contin 30mg  BID -Continue toradol, IVF, benadryl, antiemetics -Sickle cell team to see in the morning -CXR unremarkable for infection  Remote history of acute DVT left upper extremity -Patient was using Xarelto, ran out 2 days ago on the Xarelto starter pack. -Will resume Xarelto 20 mg daily -Obtain upper extremity ultrasound  History of cocaine abuse -Drug screen ordered  Possible URI with cough -Will have patient tested for influenza -Continue antitussives for cough -started on tamiflu empirically, if PCR negative, would discontinue  Mild leukocytosis -WBC 10.6 -Possibly reactive -CXR and  UA unremarkable for infection -Continue to monitor CBC -Blood cultures pending  DVT prophylaxis: Xarelto  Code Status: Full  Family Communication: none at bedside. Admission, patients condition and plan of care including tests being ordered have been discussed with the patient, who indicates understanding and agrees with the plan and Code Status.  Disposition Plan: Home when stable  Consults called: None   Admission status: Observation   Time spent: 65 minutes  Damita Eppard D.O. [facility] Pager [phone]  If 7PM-7AM, please contact night-coverage [email or web address] Password [facility] 05/29/2016, 5:44 PM

## 2016-05-30 ENCOUNTER — Encounter (HOSPITAL_COMMUNITY): Payer: Self-pay

## 2016-05-30 ENCOUNTER — Observation Stay (HOSPITAL_COMMUNITY): Payer: Medicare Other

## 2016-05-30 ENCOUNTER — Encounter (HOSPITAL_COMMUNITY): Payer: Self-pay | Admitting: *Deleted

## 2016-05-30 DIAGNOSIS — Z86718 Personal history of other venous thrombosis and embolism: Secondary | ICD-10-CM | POA: Diagnosis not present

## 2016-05-30 DIAGNOSIS — F1123 Opioid dependence with withdrawal: Secondary | ICD-10-CM

## 2016-05-30 DIAGNOSIS — M545 Low back pain: Secondary | ICD-10-CM | POA: Diagnosis present

## 2016-05-30 DIAGNOSIS — M79609 Pain in unspecified limb: Secondary | ICD-10-CM

## 2016-05-30 DIAGNOSIS — D5700 Hb-SS disease with crisis, unspecified: Secondary | ICD-10-CM | POA: Diagnosis present

## 2016-05-30 DIAGNOSIS — Z7901 Long term (current) use of anticoagulants: Secondary | ICD-10-CM | POA: Diagnosis not present

## 2016-05-30 DIAGNOSIS — K219 Gastro-esophageal reflux disease without esophagitis: Secondary | ICD-10-CM | POA: Diagnosis present

## 2016-05-30 DIAGNOSIS — Z91018 Allergy to other foods: Secondary | ICD-10-CM | POA: Diagnosis not present

## 2016-05-30 DIAGNOSIS — Z833 Family history of diabetes mellitus: Secondary | ICD-10-CM | POA: Diagnosis not present

## 2016-05-30 DIAGNOSIS — I82622 Acute embolism and thrombosis of deep veins of left upper extremity: Secondary | ICD-10-CM | POA: Diagnosis present

## 2016-05-30 DIAGNOSIS — Z87891 Personal history of nicotine dependence: Secondary | ICD-10-CM | POA: Diagnosis not present

## 2016-05-30 DIAGNOSIS — Z79899 Other long term (current) drug therapy: Secondary | ICD-10-CM | POA: Diagnosis not present

## 2016-05-30 DIAGNOSIS — F1120 Opioid dependence, uncomplicated: Secondary | ICD-10-CM | POA: Diagnosis present

## 2016-05-30 DIAGNOSIS — Z91040 Latex allergy status: Secondary | ICD-10-CM | POA: Diagnosis not present

## 2016-05-30 DIAGNOSIS — G8929 Other chronic pain: Secondary | ICD-10-CM | POA: Diagnosis present

## 2016-05-30 DIAGNOSIS — D72829 Elevated white blood cell count, unspecified: Secondary | ICD-10-CM

## 2016-05-30 DIAGNOSIS — Z8249 Family history of ischemic heart disease and other diseases of the circulatory system: Secondary | ICD-10-CM | POA: Diagnosis not present

## 2016-05-30 DIAGNOSIS — F329 Major depressive disorder, single episode, unspecified: Secondary | ICD-10-CM | POA: Diagnosis present

## 2016-05-30 DIAGNOSIS — D638 Anemia in other chronic diseases classified elsewhere: Secondary | ICD-10-CM | POA: Diagnosis present

## 2016-05-30 DIAGNOSIS — F1490 Cocaine use, unspecified, uncomplicated: Secondary | ICD-10-CM | POA: Diagnosis present

## 2016-05-30 LAB — CBC
HEMATOCRIT: 21.8 % — AB (ref 36.0–46.0)
HEMOGLOBIN: 7.4 g/dL — AB (ref 12.0–15.0)
MCH: 30.2 pg (ref 26.0–34.0)
MCHC: 33.9 g/dL (ref 30.0–36.0)
MCV: 89 fL (ref 78.0–100.0)
Platelets: 435 10*3/uL — ABNORMAL HIGH (ref 150–400)
RBC: 2.45 MIL/uL — AB (ref 3.87–5.11)
RDW: 19.3 % — ABNORMAL HIGH (ref 11.5–15.5)
WBC: 12.8 10*3/uL — AB (ref 4.0–10.5)

## 2016-05-30 LAB — DIFFERENTIAL
BASOS ABS: 0.1 10*3/uL (ref 0.0–0.1)
BASOS PCT: 0 %
Eosinophils Absolute: 0.3 10*3/uL (ref 0.0–0.7)
Eosinophils Relative: 2 %
Lymphocytes Relative: 31 %
Lymphs Abs: 4 10*3/uL (ref 0.7–4.0)
MONO ABS: 1.4 10*3/uL — AB (ref 0.1–1.0)
Monocytes Relative: 11 %
NEUTROS ABS: 7.3 10*3/uL (ref 1.7–7.7)
Neutrophils Relative %: 56 %

## 2016-05-30 LAB — BASIC METABOLIC PANEL
Anion gap: 5 (ref 5–15)
BUN: 7 mg/dL (ref 6–20)
CHLORIDE: 108 mmol/L (ref 101–111)
CO2: 25 mmol/L (ref 22–32)
Calcium: 8.3 mg/dL — ABNORMAL LOW (ref 8.9–10.3)
Creatinine, Ser: 0.72 mg/dL (ref 0.44–1.00)
GFR calc non Af Amer: >60 mL/min (ref >60)
Glucose, Bld: 91 mg/dL (ref 65–99)
POTASSIUM: 4.1 mmol/L (ref 3.5–5.1)
Sodium: 138 mmol/L (ref 135–145)

## 2016-05-30 LAB — RETICULOCYTES
RBC.: 2.45 MIL/uL — AB (ref 3.87–5.11)
RETIC COUNT ABSOLUTE: 343 10*3/uL — AB (ref 19.0–186.0)
Retic Ct Pct: 14 % — ABNORMAL HIGH (ref 0.4–3.1)

## 2016-05-30 LAB — LIPASE, BLOOD: Lipase: 27 U/L (ref 11–51)

## 2016-05-30 MED ORDER — KETOROLAC TROMETHAMINE 15 MG/ML IJ SOLN
30.0000 mg | Freq: Four times a day (QID) | INTRAMUSCULAR | Status: DC
  Administered 2016-05-30 – 2016-05-31 (×4): 30 mg via INTRAVENOUS
  Filled 2016-05-30 (×5): qty 2

## 2016-05-30 MED ORDER — SALINE SPRAY 0.65 % NA SOLN
1.0000 | NASAL | Status: DC | PRN
  Administered 2016-05-30: 1 via NASAL
  Filled 2016-05-30: qty 44

## 2016-05-30 MED ORDER — OXYCODONE HCL 5 MG PO TABS
15.0000 mg | ORAL_TABLET | ORAL | Status: DC
  Administered 2016-05-30 – 2016-05-31 (×4): 15 mg via ORAL
  Filled 2016-05-30 (×4): qty 3

## 2016-05-30 MED ORDER — HYDROXYZINE HCL 10 MG PO TABS
10.0000 mg | ORAL_TABLET | Freq: Three times a day (TID) | ORAL | Status: DC | PRN
  Administered 2016-05-30: 10 mg via ORAL
  Filled 2016-05-30 (×2): qty 1

## 2016-05-30 MED ORDER — VALACYCLOVIR HCL 500 MG PO TABS
1000.0000 mg | ORAL_TABLET | Freq: Every day | ORAL | Status: DC
  Administered 2016-05-31: 1000 mg via ORAL
  Filled 2016-05-30: qty 2

## 2016-05-30 MED ORDER — HYDROMORPHONE 1 MG/ML IV SOLN
INTRAVENOUS | Status: DC
  Administered 2016-05-30: 2 mg via INTRAVENOUS
  Administered 2016-05-30: 1 mg via INTRAVENOUS
  Administered 2016-05-30: 3.5 mg via INTRAVENOUS
  Administered 2016-05-31: 2 mg via INTRAVENOUS
  Administered 2016-05-31: 17.2 mg via INTRAVENOUS
  Administered 2016-05-31 (×5): 2 mg via INTRAVENOUS
  Filled 2016-05-30: qty 25

## 2016-05-30 NOTE — Progress Notes (Signed)
This RN and Brandi, NT went in to educate patient on remaining safe while she is here in the hospital.  Explained to patient that due to her frequent falls on previous admissions the expectation is that the bed alarm remains on at all times.  If she needs to get up to ambulate in the hall or do other activities then she needs to have a staff member with her during that time.  Patient bed alarm has been turned off and staff members stated that it wasn't them.  Patient verbalized understanding and we will continue to monitor.

## 2016-05-30 NOTE — Progress Notes (Signed)
SICKLE CELL SERVICE PROGRESS NOTE  [name] [id number] DOB: Oct 23, 1990 DOA: 05/29/2016 PCP: Angelica Chessman, MD  Assessment/Plan: Active Problems:   Sickle cell pain crisis ([place])   Leukocytosis   Cocaine use   Acute deep vein thrombosis (DVT) of left upper extremity ([name])   Sickle cell anemia with crisis ([place])  1. Hb SS with crisis: I am not convinced that patient is in crisis. Rather her symptoms and circumstances (see below) are consistent with symptoms of early withdrawal. These symptoms have resolved since starting on treatment. I will transition patient to her Oxycodone. Continue Nancy Contin and Toradol and plan for discharge home tomorrow.   2. Leukocytosis: Mild without any signs of infection.  3. Anemia of chronic disease: Hb stable.  4. Chronic pain: Continue Nancy Contin.  5. Opiate Dependence: it is my medical opinion that [name] has an opiate addiction and needs rehab. This was being arranged for her and she has now changed her mind and has decided not to enter into treatment. I have explained to her that addiction is a medical condition that warrants treatment. However going to a pain clinic is not a substitute for treatent for addiction.   Code Status: Full Code Family Communication: N/A Disposition Plan: Not yet ready for discharge  [place].  Pager [phone]. If 7PM-7AM, please contact night-coverage.  05/30/2016, 4:47 PM  LOS: 0 days   Interim History: Pt was actually out of medications this weekend which is the most likely cause for her presentation to the ED. She is scheduled to pick up her medications tomorrow. She states that her pain is localized to her low back and is at an intensity of 7/10. She is inconsistent in her reports of pain medication use and her answers to questions are in defense of her actions instead of answering the questions. As far as I can gather, patient used more of her medication than prescribed prior to her last  admission. She had minimal medications left over and ran out of medications on either Wednesday or Thursday of last week. She went to the [facility] for treatment on Friday and was discharged. She emphatically states that she was feeling "fine" on Saturday. As a matter of fact she reports "just like I used to feel before taking opiates". Then she presented to he ED on Sunday with c/o body aches, vomiting and pain and with no opiate medication to treat her pain at home.   Consultants:  None  Procedures:  None  Antibiotics:  None   Objective: Vitals:   05/30/16 0815 05/30/16 0957 05/30/16 1344 05/30/16 1437  BP:  118/82  116/73  Pulse:  (!) 105  (!) 102  Resp: 15 18 15 16  Temp:  98.5 F (36.9 C)  99.2 F (37.3 C)  TempSrc:  Oral  Oral  SpO2: 96% 100% 98% 97%  Weight:      Height:       Weight change:   Intake/Output Summary (Last 24 hours) at 05/30/16 1647 Last data filed at 05/30/16 0730  Gross per 24 hour  Intake          3367.92 ml  Output                0 ml  Net          33 67.92 ml    General: Alert, awake, oriented x3, in no acute distress. WNWD female in NAD. HEENT: [place]/AT PEERL, EOMI, anicteric Neck: Trachea midline,  no masses,  no thyromegal,y no JVD, no carotid bruit OROPHARYNX:  Moist, No exudate/ erythema/lesions.  Heart: Regular rate and rhythm, without murmurs, rubs, gallops, PMI non-displaced, no heaves or thrills on palpation.  Lungs: Clear to auscultation, no wheezing or rhonchi noted. No increased vocal fremitus resonant to percussion  Abdomen: Soft, nontender, nondistended, positive bowel sounds, no masses no hepatosplenomegaly noted.  Neuro: No focal neurological deficits noted cranial nerves II through XII grossly intact.  Strength at functional baseline in bilateral upper and lower extremities. Musculoskeletal: No warmth swelling or erythema around joints, no spinal tenderness noted. Psychiatric: Patient alert and oriented x3, good insight and  cognition, good recent to remote recall. .   Data Reviewed: Basic Metabolic Panel:  Recent Labs Lab 05/27/16 0822 05/29/16 1346 05/30/16 1456  NA 138 140 138  K 3.4* 3.7 4.1  CL 107 110 108  CO2 25 23 25   GLUCOSE 101* 106* 91  BUN 6 6 7   CREATININE 0.54 0.64 0.72  CALCIUM 8.9 8.9 8.3*   Liver Function Tests:  Recent Labs Lab 05/27/16 0822 05/29/16 1346  AST 23 24  ALT 35 35  ALKPHOS 55 58  BILITOT 1.6* 1.3*  PROT 7.3 7.2  ALBUMIN 4.3 4.6    Recent Labs Lab 05/30/16 1456  LIPASE 27   No results for input(s): AMMONIA in the last 168 hours. CBC:  Recent Labs Lab 05/24/16 1109 05/27/16 0822 05/29/16 1346 05/30/16 1456  WBC 9.0 10.0 10.6* 12.8*  NEUTROABS 5.8 6.0 7.1  --   HGB 6.6* 8.0* 8.9* 7.4*  HCT 19.6* 24.0* 25.4* 21.8*  MCV 88.3 92.0 90.7 89.0  PLT 487* 519* 516* 435*   Cardiac Enzymes: No results for input(s): CKTOTAL, CKMB, CKMBINDEX, TROPONINI in the last 168 hours. BNP (last 3 results) No results for input(s): BNP in the last 8760 hours.  ProBNP (last 3 results) No results for input(s): PROBNP in the last 8760 hours.  CBG: No results for input(s): GLUCAP in the last 168 hours.  No results found for this or any previous visit (from the past 240 hour(s)).   Studies: Dg Chest 1 View  Result Date: 05/19/2016 CLINICAL DATA:  Back pain, chest pain and shortness of breath. EXAM: CHEST 1 VIEW COMPARISON:  Body CT 02/26/2016 FINDINGS: Injectable right IJ approach central venous catheter terminates in the superior vena cava. The cardiac silhouette is mildly enlarged. Mediastinal contours appear intact. There is no evidence of focal airspace consolidation, pleural effusion or pneumothorax. Osseous structures are without acute abnormality. 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 Soft tissues are grossly normal. IMPRESSION: Mildly enlarged cardiac silhouette, otherwise no evidence of acute disease. Electronically  Signed   By: Fidela Salisbury M.D.   On: 05/19/2016 20:02   Dg Chest 2 View  Result Date: 05/29/2016 CLINICAL DATA:  Pt with Hx sickle cell anemia c/o abdominal pain and back pain onset last night, cough onset last night. Reports pain to be due to sickle cell anemia GERD, former smoker x 3 years ago EXAM: CHEST  2 VIEW COMPARISON:  05/19/2016 FINDINGS: Cardiac silhouette is normal in size. No mediastinal or hilar masses. No evidence of adenopathy. Clear lungs.  No pleural effusion.  No pneumothorax. Right anterior chest wall Port-A-Cath is stable with its tip in the lower superior vena cava. 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 IMPRESSION: No active cardiopulmonary disease. Electronically Signed   By: Lajean Manes M.D.   On: 05/29/2016 16:42   Dg  Shoulder Left  Result Date: 05/22/2016 CLINICAL DATA:  26 year old who fell out of her bed and injured the left shoulder. Initial encounter. Current history of sickle cell disease. EXAM: LEFT SHOULDER - 2+ VIEW COMPARISON:  Left humerus x-rays 04/17/2016. Left shoulder x-rays 05/06/2012, 12/11/2011. FINDINGS: Examination was performed portably, in the axial images therefore less than optimal. No evidence of acute fracture or glenohumeral dislocation. Acromioclavicular joint intact. Osteonecrosis involving the humeral head is again noted, and there is no evidence of osteochondral fracture. IMPRESSION: No acute osseous abnormality. 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 Electronically Signed   By: Evangeline Dakin M.D.   On: 05/22/2016 16:37    Scheduled Meds: . HYDROmorphone   Intravenous Q4H  . hydroxyurea  1,000 mg Oral Daily  . ketorolac  30 mg Intravenous Q6H  . morphine  30 mg Oral Q12H  . rivaroxaban  20 mg Oral Q24H  . senna-docusate  1 tablet Oral BID  . valACYclovir  1,000 mg Oral Daily   Continuous Infusions: . sodium chloride 125 mL/hr at 05/30/16 1248    Active Problems:   Sickle  cell pain crisis ([name])   Leukocytosis   Cocaine use   Acute deep vein thrombosis (DVT) of left upper extremity ([name])   Sickle cell anemia with crisis ([place])     In excess of 35 minutes spent during this visit. Greater than 50% involved face to face contact with the patient for assessment, counseling and coordination of care.

## 2016-05-30 NOTE — Progress Notes (Signed)
Patient was observed getting up to restroom without assistance.  Patient stated "The bed alarm didn't go off when I got up" -- however bed alarm was initiated.  Patient re-educated surrounding her safety and encouraged to call for help.  Will continue to monitor.

## 2016-05-30 NOTE — Progress Notes (Signed)
VASCULAR LAB PRELIMINARY  PRELIMINARY  PRELIMINARY  PRELIMINARY  Bilateral upper extremity venous duplex completed.    Preliminary report:  The DVT found in the left brachial vein 12/17, remains, but is now subacute.  There is no other thrombosis noted.    Gave report to [place], Therapist, sports.  Ramina Hulet, RVT 05/30/2016, 10:02 AM

## 2016-05-31 ENCOUNTER — Encounter (HOSPITAL_COMMUNITY): Payer: Self-pay | Admitting: *Deleted

## 2016-05-31 ENCOUNTER — Other Ambulatory Visit: Payer: Self-pay | Admitting: Internal Medicine

## 2016-05-31 DIAGNOSIS — F1910 Other psychoactive substance abuse, uncomplicated: Secondary | ICD-10-CM | POA: Diagnosis not present

## 2016-05-31 DIAGNOSIS — M545 Low back pain: Secondary | ICD-10-CM | POA: Diagnosis not present

## 2016-05-31 DIAGNOSIS — D571 Sickle-cell disease without crisis: Secondary | ICD-10-CM

## 2016-05-31 DIAGNOSIS — D5700 Hb-SS disease with crisis, unspecified: Secondary | ICD-10-CM | POA: Diagnosis not present

## 2016-05-31 DIAGNOSIS — J392 Other diseases of pharynx: Secondary | ICD-10-CM

## 2016-05-31 DIAGNOSIS — G894 Chronic pain syndrome: Secondary | ICD-10-CM

## 2016-05-31 DIAGNOSIS — Z7901 Long term (current) use of anticoagulants: Secondary | ICD-10-CM

## 2016-05-31 LAB — URINE CULTURE

## 2016-05-31 MED ORDER — HEPARIN SOD (PORK) LOCK FLUSH 100 UNIT/ML IV SOLN: 500.0000 [IU] | INTRAVENOUS | Status: DC

## 2016-05-31 MED ORDER — HEPARIN SOD (PORK) LOCK FLUSH 100 UNIT/ML IV SOLN
500.0000 [IU] | INTRAVENOUS | Status: DC | PRN
  Administered 2016-05-31: 500 [IU]
  Filled 2016-05-31: qty 5

## 2016-05-31 MED ORDER — MORPHINE SULFATE ER 30 MG PO TBCR: 30.0000 mg | EXTENDED_RELEASE_TABLET | Freq: Two times a day (BID) | ORAL | 0 refills | Status: DC

## 2016-05-31 MED ORDER — AMOXICILLIN-POT CLAVULANATE 875-125 MG PO TABS: 1.0000 | ORAL_TABLET | Freq: Two times a day (BID) | ORAL | 0 refills | Status: DC

## 2016-05-31 MED ORDER — AMOXICILLIN-POT CLAVULANATE 875-125 MG PO TABS
1.0000 | ORAL_TABLET | Freq: Two times a day (BID) | ORAL | Status: DC
  Administered 2016-05-31: 1 via ORAL
  Filled 2016-05-31: qty 1

## 2016-05-31 MED ORDER — SODIUM CHLORIDE 0.9 % IV SOLN
25.0000 mg | Freq: Once | INTRAVENOUS | Status: AC
  Administered 2016-05-31: 25 mg via INTRAVENOUS
  Filled 2016-05-31: qty 0.5

## 2016-05-31 MED ORDER — OXYCODONE HCL 15 MG PO TABS: 15.0000 mg | ORAL_TABLET | ORAL | 0 refills | Status: DC | PRN

## 2016-05-31 MED FILL — oxyCODONE HCL 15 MG TABS: 15 | 15 days supply | Qty: 90 | Fill #0

## 2016-05-31 MED FILL — MORPHINE SULF ER 30 MG TAB: 30 | 30 days supply | Qty: 60 | Fill #0

## 2016-05-31 NOTE — Progress Notes (Signed)
Patient d/c home. Stable. 

## 2016-05-31 NOTE — Discharge Summary (Signed)
DAGNEY SAVASTANO MRN: [id number] DOB/AGE: 1990/06/28 26 y.o.  Admit date: 05/29/2016 Discharge date: 05/31/2016    Recommendations: 1. Follow up on throat culture and Rapid Strept swab. 2. Pill counts regularly performed for evaluation of appropriate usage of her Opiates. . 3. Pt be encouraged to pursue in-patient drug rehab. 4. Follow up evaluation for Pharyngeal evaluation and referral to ENT if not resolving  Primary Care Physician:  Angelica Chessman, MD   Discharge Diagnoses:   Patient Active Problem List   Diagnosis Date Noted  . Major depression, chronic 01/06/2011    Priority: High  . Sickle cell disease ([place]) 01/08/2009    Priority: High  . Sickle cell anemia with crisis ([place]) 05/29/2016  . Sickle cell anemia ([place]) 05/19/2016  . Vasoocclusive sickle cell crisis ([place]) 05/19/2016  . Acute deep vein thrombosis (DVT) of left upper extremity ([place]) 04/17/2016  . Post partum depression 04/03/2016  . Episode of recurrent major depressive disorder ([place])   . Sickle cell anemia with pain ([place]) 02/16/2016  . Thrombocytosis ([place]) 11/09/2015  . Hyperbilirubinemia 11/09/2015  . Pyrexia   . Narcotic dependence ([place]) 10/06/2015  . 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 High risk for intrapartum complications, antepartum 08/12/2015  . Chronic pain 08/04/2015  . Cocaine use 07/24/2015  . Herpes simplex 07/14/2015  . Anemia of chronic disease   . Leukocytosis   . Hb-SS disease without crisis ([place]) 02/26/2013  . Sickle cell pain crisis ([place]) 03/29/2012  . 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    Latex Rash      Medication List    TAKE these medications   amoxicillin-clavulanate 875-125 MG tablet Commonly known as:  AUGMENTIN Take 1 tablet by mouth every 12 (twelve) hours.   aspirin-acetaminophen-caffeine 250-250-65 MG  tablet Commonly known as:  EXCEDRIN MIGRAINE Take 2 tablets by mouth every 6 (six) hours as needed for headache.   cyclobenzaprine 10 MG tablet Commonly known as:  FLEXERIL Take 1 tablet (10 mg total) by mouth 3 (three) times daily as needed for muscle spasms.   diphenhydrAMINE 25 MG tablet Commonly known as:  BENADRYL Take 25 mg by mouth at bedtime as needed for sleep.   hydroxyurea 500 MG capsule Commonly known as:  HYDREA Take 2 capsules (1,000 mg total) by mouth daily. May take with food to minimize GI side effects.   ibuprofen 600 MG tablet Commonly known as:  ADVIL,MOTRIN Take 1 tablet (600 mg total) by mouth every 6 (six) hours as needed for mild pain or moderate pain.   morphine 30 MG 12 hr tablet Commonly known as:  MS CONTIN Take 1 tablet (30 mg total) by mouth every 12 (twelve) hours. What changed:  how much to take   oxyCODONE 15 MG immediate release tablet Commonly known as:  ROXICODONE Take 1 tablet (15 mg total) by mouth every 4 (four) hours as needed for pain.   rivaroxaban 20 MG Tabs tablet Commonly known as:  XARELTO Take 20 mg by mouth daily with supper.   valACYclovir 1000 MG tablet Commonly known as:  VALTREX Take 1 tablet (1,000 mg total) by mouth daily. What changed:  when to take this  reasons to take this         Consults:    SIGNIFICANT DIAGNOSTIC STUDIES:  Dg Chest 1 View  Result Date: 05/19/2016  CLINICAL DATA:  Back pain, chest pain and shortness of breath. EXAM: CHEST 1 VIEW COMPARISON:  Body CT 02/26/2016 FINDINGS: Injectable right IJ approach central venous catheter terminates in the superior vena cava. The cardiac silhouette is mildly enlarged. Mediastinal contours appear intact. There is no evidence of focal airspace consolidation, pleural effusion or pneumothorax. Osseous structures are without acute abnormality. 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 Soft tissues are grossly normal.  IMPRESSION: Mildly enlarged cardiac silhouette, otherwise no evidence of acute disease. Electronically Signed   By: Fidela Salisbury M.D.   On: 05/19/2016 20:02   Dg Chest 2 View  Result Date: 05/29/2016 CLINICAL DATA:  Pt with Hx sickle cell anemia c/o abdominal pain and back pain onset last night, cough onset last night. Reports pain to be due to sickle cell anemia GERD, former smoker x 3 years ago EXAM: CHEST  2 VIEW COMPARISON:  05/19/2016 FINDINGS: Cardiac silhouette is normal in size. No mediastinal or hilar masses. No evidence of adenopathy. Clear lungs.  No pleural effusion.  No pneumothorax. Right anterior chest wall Port-A-Cath is stable with its tip in the lower superior vena cava. 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 IMPRESSION: No active cardiopulmonary disease. Electronically Signed   By: Lajean Manes M.D.   On: 05/29/2016 16:42   Dg Shoulder Left  Result Date: 05/22/2016 CLINICAL DATA:  26 year old who fell out of her bed and injured the left shoulder. Initial encounter. Current history of sickle cell disease. EXAM: LEFT SHOULDER - 2+ VIEW COMPARISON:  Left humerus x-rays 04/17/2016. Left shoulder x-rays 05/06/2012, 12/11/2011. FINDINGS: Examination was performed portably, in the axial images therefore less than optimal. No evidence of acute fracture or glenohumeral dislocation. Acromioclavicular joint intact. Osteonecrosis involving the humeral head is again noted, and there is no evidence of osteochondral fracture. IMPRESSION: No acute osseous abnormality. 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 Electronically Signed   By: Evangeline Dakin M.D.   On: 05/22/2016 16:37       Recent Results (from the past 240 hour(s))  Urine culture     Status: Abnormal   Collection Time: 05/29/16  3:12 PM  Result Value Ref Range Status   Specimen Description URINE, RANDOM  Final   Special Requests NONE  Final   Culture MULTIPLE SPECIES  PRESENT, SUGGEST RECOLLECTION (A)  Final   Report Status 05/31/2016 FINAL  Final    BRIEF ADMITTING H & P: Xin Halsted Consoli is a 26 y.o. female with a medical history of sickle cell, chronic back pain, recently discharged 05/27/2016 after being emitted for sickle cell crisis. Patient was thought to be improved at discharge and she went home and stated she was feeling okay. Patient was able to hang out with her sister over the weekend however yesterday evening patient started to feel lower back pain as well as abdominal pain, rates it as 8/10. Patient states she feels that the cold weather has exacerbated. She also feels that she has had nonproductive cough with upper respiratory symptoms. The patient has had mild chills at home. She denies any ill contacts, chest pain, shortness of breath, headache, dizziness, nausea, vomiting, diarrhea or constipation. Patient states her last bowel movement was yesterday. She did not try taking her home medications for her pain as she felt that it would be ineffective.  ED Course: Found to be in sickle cell crises.  Given multiple doses of Dilaudid, Toradol, Zofran and Benadryl. Based on IV fluids.  Chest x-ray and UA unremarkable for infection. TRH for admission.   Hospital Course:  Present on Admission: . Sickle cell pain crisis ([place]) . Sickle cell anemia with crisis ([place]) . Leukocytosis . Cocaine use . Acute deep vein thrombosis (DVT) of left upper extremity ([name])  Opiate tolerant patient with Hb SS admitted with pain characteristic of Sickle Cell Crisis. However it is my assessment after an in-depth conversation with the patient that her pain was triggered by withdrawal as she was without her medications- as a result of reported increased use earlier in the month. She presented with body aches and chills and emesis. Pt also reported an episode of diarrhea prior to presenting to the hospital. All of these symptoms resolved with initiation of opiates. She was  initially managed with IV Dilaudid and then transitioned to her oral regimen of MS Contin and Oxycodone. She is  To pick up her prescriptions from Dr. Christa See office today upon discharge.   Pharyngeal Ulceration: Pt has an ulceration on the right posterior Pharynx. Swab has been sent for rapid strept and culture. Pt as no palpable mass or swelling noted in the sub-mandibular region on examination. However due to her risk if this is strept, she has been started empirically on Augmentin. If this does not resolve, I would recommend an out patient evaluation by ENT and probable biopsy to ensure this is not a malignancy.   Otherwise patient was continued on her usual medications for her chronic conditions including Xarelto for LUE DVT treatment, MS Contin for chronic pain syndrome and Hydrea for Sickle Cell Disease. As a clarification, patient on her last admission received Depo Provera to abort vaginal bleeding. However contrary to what is noted in her home medication list, she is not on Depo-provera on an every 3 month basis. Pt is to follow up with either her PMD or Gynecologist regarding contraception.     Disposition and Follow-up: Pt is discharged home in stable condition and is to follow up with her PMD Dr. Doreene Burke as noted on 2/13/12018 at 10:30 am. She is to pick up her Opiate prescriptions form the clinic today.   DISCHARGE EXAM:  General: Alert, awake, oriented x3, in mild distress.  HEENT: [place]/AT PEERL, EOMI, anicteric Neck: Trachea midline, no masses, no thyromegal,y no JVD, no carotid bruit OROPHARYNX: Moist, No exudate/ erythema. However she does have an area of ulceration noted on the right side of the posterior Pharynx. The is no purulent discahrge or bleeding noted on the ulcerated area.   Heart: Regular rate and rhythm, without murmurs, rubs, gallops or S3. PMI non-displaced. Exam reveals no decreased pulses. Pulmonary/Chest: Normal effort. Breath sounds normal. No. Apnea. Clear to  auscultation,no stridor,  no wheezing and no rhonchi noted. No respiratory distress and no tenderness noted. Abdomen: Soft, nontender, nondistended, normal bowel sounds, no masses no hepatosplenomegaly noted. No fluid wave and no ascites. There is no guarding or rebound. Neuro: Alert and oriented to person, place and time. Normal motor skills, Displays no atrophy or tremors and exhibits normal muscle tone.  No focal neurological deficits noted cranial nerves II through XII grossly intact. No sensory deficit noted.  Strength at baseline in bilateral upper and lower extremities. Gait normal. Musculoskeletal: No warmth swelling or erythema around joints, no spinal tenderness noted. Psychiatric: Patient alert and oriented x3, good insight and cognition, good recent to remote recall. Mood, affect and judgement normal Lymph node survey: No cervical axillary or inguinal lymphadenopathy noted. Skin: Skin is warm and dry.  No bruising, no ecchymosis and no rash noted. Pt is not diaphoretic. No erythema. No pallor     Blood pressure 98/65, pulse 95, temperature 97.7 F (36.5 C), temperature source Oral, resp. rate 16, height 5\' 1"  (1.549 m), weight 84.1 kg (185 lb 6.5 oz), last menstrual period 04/14/2016, SpO2 100 %, not currently breastfeeding.   Recent Labs  05/29/16 1346 05/30/16 1456  NA 140 138  K 3.7 4.1  CL 110 108  CO2 23 25  GLUCOSE 106* 91  BUN 6 7  CREATININE 0.64 0.72  CALCIUM 8.9 8.3*    Recent Labs  05/29/16 1346  AST 24  ALT 35  ALKPHOS 58  BILITOT 1.3*  PROT 7.2  ALBUMIN 4.6    Recent Labs  05/30/16 1456  LIPASE 27    Recent Labs  05/29/16 1346 05/30/16 1456  WBC 10.6* 12.8*  NEUTROABS 7.1 7.3  HGB 8.9* 7.4*  HCT 25.4* 21.8*  MCV 90.7 89.0  PLT 516* 435*     Total time spent including face to face and decision making was greater than 30 minutes  Signed: Krist Rosenboom A. 05/31/2016, 12:32 PM

## 2016-05-31 NOTE — Consult Note (Signed)
   [facility] CM Inpatient Consult   05/31/2016  [name] 18-Aug-1990 [id number]    Received call from Dr. Zigmund Daniel regarding reasons why [facility] is not attempting to follow [name] any longer. Writer advised to speak with [name] to discuss further.   Spoke with [name] at length to discuss ongoing attempts by [name] RNCM to reach her once she discharges from the hospital without success. [name], once again, states she has been having trouble with her cell phone. Also states she does not open up to people that easily and does not like to discuss her personal life. Discussed that [facility] has always been a voluntary program and that if she was not interested in the program she did not have to consent, in the past, for [facility] Care Management follow up. Made her aware that since she has not engaged with [place] post hospital discharge, [facility] will not attempt to follow.   Also discussed her substance abuse and decision in not engaging with a substance abuse treatment program. She states she wanted to do rehab at first but changed her mind. States she is relying on God to help her with her issues and she plans on not doing anymore cocaine. States she has not done cocaine in the past several weeks. Made Claurissa aware that since she is not participating in a substance abuse program and/or is not currently following the recommendations to do so, [facility] Care Management will not follow.   [name] often shares her feelings and willingness to get help with Probation officer during bedside encounters. However, once [name] is discharged from the hospital she does not engage with [place] by not responding to calls and the like. Of note, [name] states she has started working with the [employer] who has been assisting her.    [name] understands that [facility] will not follow due to her lack and willingness to  participate with [facility] once discharged and due to her not participating in recommended treatment program for substance abuse. Dr. Zigmund Daniel aware of bedside encounter.    Marthenia Rolling, MSN-Ed, RN,BSN [facility] Liaison [phone]

## 2016-05-31 NOTE — Progress Notes (Signed)
Discharge instructions given to patient,verbalized understanding. Also discussed follow-up appointment and where to pick-up oral antibiotics. Awaiting for mother to transport her home.

## 2016-05-31 NOTE — Progress Notes (Signed)
IV site unremarkable.Fushed and deaccesed per protocol.

## 2016-05-31 NOTE — Consult Note (Signed)
   [facility] CM Inpatient Consult   05/31/2016  ADRIANA MCKINSEY 01-02-1991 [id number]    [facility] Care Management follow up. Due to consistent unsuccessful attempts in outreaching to Ms. Middendorf upon previous hospital discharges and due to her not seeking treatment for substance abuse or for opiate addiction, [facility] Care Management unable to follow Ms. Penix at this time. Discussed the above with [place] Management Assistant Director. Will make inpatient RNCM aware. Of note, please see chart review then notes for recent patient outreach attempts by [facility] RNCM.   Marthenia Rolling, MSN-Ed, RN,BSN [facility] Liaison [phone]

## 2016-05-31 NOTE — Progress Notes (Signed)
SATURATION QUALIFICATIONS: (This note is used to comply with regulatory documentation for home oxygen)  Patient Saturations on Room Air at Rest = 100%  Patient Saturations on Room Air while Ambulating = 100%  Patient Saturations on **0* Liters of oxygen while Ambulating = *0**%  Please briefly explain why patient needs home oxygen:

## 2016-06-01 LAB — PAIN MGMT, PROFILE 8 W/CONF, U
6 Acetylmorphine: NEGATIVE ng/mL (ref <10)
Alcohol Metabolites: NEGATIVE ng/mL (ref <500)
Amphetamines: NEGATIVE ng/mL (ref <500)
BENZODIAZEPINES: NEGATIVE ng/mL (ref <100)
Buprenorphine: NEGATIVE ng/mL (ref <5)
Cocaine Metabolite: NEGATIVE ng/mL (ref <150)
Codeine: NEGATIVE ng/mL (ref <50)
Creatinine: 12.3 mg/dL — ABNORMAL LOW (ref >=20.0)
HYDROCODONE: NEGATIVE ng/mL (ref <50)
Hydromorphone: 1603 ng/mL — ABNORMAL HIGH (ref <50)
MDMA: NEGATIVE ng/mL (ref <500)
Marijuana Metabolite: NEGATIVE ng/mL (ref <20)
Morphine: 269 ng/mL — ABNORMAL HIGH (ref <50)
NORHYDROCODONE: NEGATIVE ng/mL (ref <50)
OPIATES: POSITIVE ng/mL — AB (ref <100)
OXIDANT: NEGATIVE ug/mL (ref <200)
Oxycodone: NEGATIVE ng/mL (ref <100)
PLEASE NOTE: 0
Specific Gravity: 1.002 — ABNORMAL LOW (ref >=1.003)
pH: 6.66 (ref 4.5–9.0)

## 2016-06-01 LAB — RPR: RPR Ser Ql: NONREACTIVE

## 2016-06-01 LAB — HIV ANTIBODY (ROUTINE TESTING W REFLEX): HIV Screen 4th Generation wRfx: NONREACTIVE

## 2016-06-03 LAB — CULTURE, GROUP A STREP (THRC)

## 2016-06-04 LAB — CULTURE, BLOOD (ROUTINE X 2)
CULTURE: NO GROWTH
Culture: NO GROWTH

## 2016-06-07 ENCOUNTER — Telehealth: Payer: Self-pay | Admitting: Hematology

## 2016-06-07 NOTE — Telephone Encounter (Signed)
Left message asking patient to call [facility].  Reminded patient about the document she signed on 05/31/2016 agreeing to have her narcotic medications counted every 2 days.  Advised the importance of keeping up with the count.  Asked patient to give me a call back to advise when she can come in as she is already past due for a medication count.

## 2016-06-10 ENCOUNTER — Inpatient Hospital Stay (HOSPITAL_COMMUNITY)
Admission: EM | Admit: 2016-06-10 | Discharge: 2016-06-15 | DRG: 812 | Disposition: A | Discharge status: Home / Self Care | Payer: Medicare Other | Attending: Internal Medicine | Admitting: Internal Medicine

## 2016-06-10 ENCOUNTER — Inpatient Hospital Stay (HOSPITAL_COMMUNITY): Payer: Medicare Other

## 2016-06-10 ENCOUNTER — Other Ambulatory Visit: Payer: Self-pay

## 2016-06-10 ENCOUNTER — Emergency Department (HOSPITAL_COMMUNITY): Payer: Medicare Other

## 2016-06-10 ENCOUNTER — Encounter (HOSPITAL_COMMUNITY): Payer: Self-pay | Admitting: Emergency Medicine

## 2016-06-10 DIAGNOSIS — Z79891 Long term (current) use of opiate analgesic: Secondary | ICD-10-CM | POA: Diagnosis not present

## 2016-06-10 DIAGNOSIS — D5700 Hb-SS disease with crisis, unspecified: Principal | ICD-10-CM | POA: Diagnosis present

## 2016-06-10 DIAGNOSIS — Y92009 Unspecified place in unspecified non-institutional (private) residence as the place of occurrence of the external cause: Secondary | ICD-10-CM

## 2016-06-10 DIAGNOSIS — D638 Anemia in other chronic diseases classified elsewhere: Secondary | ICD-10-CM | POA: Diagnosis present

## 2016-06-10 DIAGNOSIS — W19XXXA Unspecified fall, initial encounter: Secondary | ICD-10-CM

## 2016-06-10 DIAGNOSIS — R55 Syncope and collapse: Secondary | ICD-10-CM | POA: Diagnosis present

## 2016-06-10 DIAGNOSIS — K219 Gastro-esophageal reflux disease without esophagitis: Secondary | ICD-10-CM | POA: Diagnosis present

## 2016-06-10 DIAGNOSIS — Z91040 Latex allergy status: Secondary | ICD-10-CM

## 2016-06-10 DIAGNOSIS — Z833 Family history of diabetes mellitus: Secondary | ICD-10-CM

## 2016-06-10 DIAGNOSIS — S299XXA Unspecified injury of thorax, initial encounter: Secondary | ICD-10-CM | POA: Diagnosis not present

## 2016-06-10 DIAGNOSIS — T402X5A Adverse effect of other opioids, initial encounter: Secondary | ICD-10-CM | POA: Diagnosis present

## 2016-06-10 DIAGNOSIS — Z87891 Personal history of nicotine dependence: Secondary | ICD-10-CM | POA: Diagnosis not present

## 2016-06-10 DIAGNOSIS — R400 Somnolence: Secondary | ICD-10-CM | POA: Diagnosis not present

## 2016-06-10 DIAGNOSIS — Z9289 Personal history of other medical treatment: Secondary | ICD-10-CM

## 2016-06-10 DIAGNOSIS — M545 Low back pain: Secondary | ICD-10-CM | POA: Diagnosis not present

## 2016-06-10 DIAGNOSIS — Z79899 Other long term (current) drug therapy: Secondary | ICD-10-CM

## 2016-06-10 DIAGNOSIS — D72829 Elevated white blood cell count, unspecified: Secondary | ICD-10-CM | POA: Diagnosis present

## 2016-06-10 DIAGNOSIS — F329 Major depressive disorder, single episode, unspecified: Secondary | ICD-10-CM | POA: Diagnosis present

## 2016-06-10 DIAGNOSIS — D68318 Other hemorrhagic disorder due to intrinsic circulating anticoagulants, antibodies, or inhibitors: Secondary | ICD-10-CM | POA: Diagnosis not present

## 2016-06-10 DIAGNOSIS — I2699 Other pulmonary embolism without acute cor pulmonale: Secondary | ICD-10-CM

## 2016-06-10 DIAGNOSIS — Z8249 Family history of ischemic heart disease and other diseases of the circulatory system: Secondary | ICD-10-CM

## 2016-06-10 DIAGNOSIS — G894 Chronic pain syndrome: Secondary | ICD-10-CM | POA: Diagnosis not present

## 2016-06-10 DIAGNOSIS — Z7901 Long term (current) use of anticoagulants: Secondary | ICD-10-CM | POA: Diagnosis not present

## 2016-06-10 DIAGNOSIS — R51 Headache: Secondary | ICD-10-CM | POA: Diagnosis not present

## 2016-06-10 DIAGNOSIS — Z91018 Allergy to other foods: Secondary | ICD-10-CM | POA: Diagnosis not present

## 2016-06-10 DIAGNOSIS — D571 Sickle-cell disease without crisis: Secondary | ICD-10-CM

## 2016-06-10 DIAGNOSIS — Z86718 Personal history of other venous thrombosis and embolism: Secondary | ICD-10-CM | POA: Diagnosis not present

## 2016-06-10 DIAGNOSIS — S0990XA Unspecified injury of head, initial encounter: Secondary | ICD-10-CM | POA: Diagnosis not present

## 2016-06-10 DIAGNOSIS — M546 Pain in thoracic spine: Secondary | ICD-10-CM | POA: Diagnosis not present

## 2016-06-10 DIAGNOSIS — G43909 Migraine, unspecified, not intractable, without status migrainosus: Secondary | ICD-10-CM | POA: Diagnosis present

## 2016-06-10 DIAGNOSIS — G8929 Other chronic pain: Secondary | ICD-10-CM | POA: Diagnosis present

## 2016-06-10 DIAGNOSIS — F1190 Opioid use, unspecified, uncomplicated: Secondary | ICD-10-CM | POA: Diagnosis not present

## 2016-06-10 DIAGNOSIS — S199XXA Unspecified injury of neck, initial encounter: Secondary | ICD-10-CM | POA: Diagnosis not present

## 2016-06-10 DIAGNOSIS — S4992XA Unspecified injury of left shoulder and upper arm, initial encounter: Secondary | ICD-10-CM | POA: Diagnosis not present

## 2016-06-10 DIAGNOSIS — W1830XA Fall on same level, unspecified, initial encounter: Secondary | ICD-10-CM | POA: Diagnosis present

## 2016-06-10 DIAGNOSIS — F1110 Opioid abuse, uncomplicated: Secondary | ICD-10-CM | POA: Diagnosis not present

## 2016-06-10 DIAGNOSIS — R404 Transient alteration of awareness: Secondary | ICD-10-CM | POA: Diagnosis not present

## 2016-06-10 LAB — URINALYSIS, ROUTINE W REFLEX MICROSCOPIC
BACTERIA UA: NONE SEEN
Bilirubin Urine: NEGATIVE
GLUCOSE, UA: NEGATIVE mg/dL
Hgb urine dipstick: NEGATIVE
KETONES UR: NEGATIVE mg/dL
Nitrite: NEGATIVE
PROTEIN: NEGATIVE mg/dL
Specific Gravity, Urine: 1.003 — ABNORMAL LOW (ref 1.005–1.030)
pH: 6 (ref 5.0–8.0)

## 2016-06-10 LAB — ECHOCARDIOGRAM COMPLETE
Height: 62 in
Weight: 2960 oz

## 2016-06-10 LAB — COMPREHENSIVE METABOLIC PANEL
ALBUMIN: 4 g/dL (ref 3.5–5.0)
ALT: 19 U/L (ref 14–54)
ANION GAP: 9 (ref 5–15)
AST: 25 U/L (ref 15–41)
Alkaline Phosphatase: 51 U/L (ref 38–126)
BUN: 11 mg/dL (ref 6–20)
CHLORIDE: 105 mmol/L (ref 101–111)
CO2: 23 mmol/L (ref 22–32)
Calcium: 9 mg/dL (ref 8.9–10.3)
Creatinine, Ser: 0.66 mg/dL (ref 0.44–1.00)
GFR calc Af Amer: >60 mL/min (ref >60)
GFR calc non Af Amer: >60 mL/min (ref >60)
GLUCOSE: 99 mg/dL (ref 65–99)
POTASSIUM: 3.6 mmol/L (ref 3.5–5.1)
SODIUM: 137 mmol/L (ref 135–145)
Total Bilirubin: 2.7 mg/dL — ABNORMAL HIGH (ref 0.3–1.2)
Total Protein: 6.4 g/dL — ABNORMAL LOW (ref 6.5–8.1)

## 2016-06-10 LAB — CBC WITH DIFFERENTIAL/PLATELET
BASOS ABS: 0 10*3/uL (ref 0.0–0.1)
Basophils Relative: 0 %
Eosinophils Absolute: 0.5 10*3/uL (ref 0.0–0.7)
Eosinophils Relative: 4 %
HCT: 23.5 % — ABNORMAL LOW (ref 36.0–46.0)
HEMOGLOBIN: 8.2 g/dL — AB (ref 12.0–15.0)
Lymphocytes Relative: 27 %
Lymphs Abs: 3.1 10*3/uL (ref 0.7–4.0)
MCH: 30.6 pg (ref 26.0–34.0)
MCHC: 34.9 g/dL (ref 30.0–36.0)
MCV: 87.7 fL (ref 78.0–100.0)
MONO ABS: 1.4 10*3/uL — AB (ref 0.1–1.0)
MONOS PCT: 12 %
NEUTROS PCT: 57 %
Neutro Abs: 6.5 10*3/uL (ref 1.7–7.7)
Platelets: 551 10*3/uL — ABNORMAL HIGH (ref 150–400)
RBC: 2.68 MIL/uL — AB (ref 3.87–5.11)
RDW: 17.5 % — AB (ref 11.5–15.5)
WBC: 11.5 10*3/uL — AB (ref 4.0–10.5)

## 2016-06-10 LAB — I-STAT BETA HCG BLOOD, ED (MC, WL, AP ONLY): I-stat hCG, quantitative: <5 m[IU]/mL (ref <5)

## 2016-06-10 LAB — RETICULOCYTES
RBC.: 2.68 MIL/uL — AB (ref 3.87–5.11)
Retic Count, Absolute: 364.5 10*3/uL — ABNORMAL HIGH (ref 19.0–186.0)
Retic Ct Pct: 13.6 % — ABNORMAL HIGH (ref 0.4–3.1)

## 2016-06-10 MED ORDER — KETOROLAC TROMETHAMINE 30 MG/ML IJ SOLN
30.0000 mg | Freq: Four times a day (QID) | INTRAMUSCULAR | Status: AC
  Administered 2016-06-10 – 2016-06-15 (×19): 30 mg via INTRAVENOUS
  Filled 2016-06-10 (×19): qty 1

## 2016-06-10 MED ORDER — KETOROLAC TROMETHAMINE 30 MG/ML IJ SOLN
30.0000 mg | INTRAMUSCULAR | Status: AC
  Administered 2016-06-10: 30 mg via INTRAVENOUS
  Filled 2016-06-10: qty 1

## 2016-06-10 MED ORDER — HYDROMORPHONE 1 MG/ML IV SOLN
INTRAVENOUS | Status: DC
  Administered 2016-06-10: 06:00:00 via INTRAVENOUS
  Administered 2016-06-10: 6.9 mg via INTRAVENOUS
  Administered 2016-06-10: 6.3 mg via INTRAVENOUS
  Administered 2016-06-10: 22:00:00 via INTRAVENOUS
  Administered 2016-06-11 (×2): 5.6 mg via INTRAVENOUS
  Administered 2016-06-11: 7 mg via INTRAVENOUS
  Administered 2016-06-11: 2.1 mg via INTRAVENOUS
  Filled 2016-06-10 (×2): qty 25

## 2016-06-10 MED ORDER — MORPHINE SULFATE ER 30 MG PO TBCR
30.0000 mg | EXTENDED_RELEASE_TABLET | Freq: Two times a day (BID) | ORAL | Status: DC
  Administered 2016-06-10 – 2016-06-15 (×11): 30 mg via ORAL
  Filled 2016-06-10 (×11): qty 1

## 2016-06-10 MED ORDER — IOPAMIDOL (ISOVUE-370) INJECTION 76%
INTRAVENOUS | Status: AC
  Administered 2016-06-10: 100 mL
  Filled 2016-06-10: qty 100

## 2016-06-10 MED ORDER — HYDROMORPHONE HCL 2 MG/ML IJ SOLN: 2.0000 mg | INTRAMUSCULAR | Status: AC

## 2016-06-10 MED ORDER — SODIUM CHLORIDE 0.45 % IV BOLUS
1000.0000 mL | Freq: Once | INTRAVENOUS | Status: AC
  Administered 2016-06-10: 1000 mL via INTRAVENOUS

## 2016-06-10 MED ORDER — HYDROMORPHONE HCL 2 MG/ML IJ SOLN: 2.0000 mg | INTRAMUSCULAR | Status: DC | PRN

## 2016-06-10 MED ORDER — SODIUM CHLORIDE 0.9% FLUSH
10.0000 mL | Freq: Two times a day (BID) | INTRAVENOUS | Status: DC
  Administered 2016-06-14: 10 mL

## 2016-06-10 MED ORDER — ONDANSETRON HCL 4 MG/2ML IJ SOLN
4.0000 mg | INTRAMUSCULAR | Status: DC | PRN
  Administered 2016-06-10: 4 mg via INTRAVENOUS
  Filled 2016-06-10: qty 2

## 2016-06-10 MED ORDER — TECHNETIUM TO 99M ALBUMIN AGGREGATED
4.1000 | Freq: Once | INTRAVENOUS | Status: AC | PRN
  Administered 2016-06-10: 4.1 via INTRAVENOUS

## 2016-06-10 MED ORDER — TECHNETIUM TC 99M DIETHYLENETRIAME-PENTAACETIC ACID: 30.9000 | Freq: Once | INTRAVENOUS | Status: DC | PRN

## 2016-06-10 MED ORDER — HYDROMORPHONE HCL 2 MG/ML IJ SOLN
2.0000 mg | INTRAMUSCULAR | Status: AC
  Administered 2016-06-10: 2 mg via INTRAVENOUS
  Filled 2016-06-10: qty 1

## 2016-06-10 MED ORDER — DIPHENHYDRAMINE HCL 25 MG PO CAPS
25.0000 mg | ORAL_CAPSULE | ORAL | Status: DC | PRN
  Administered 2016-06-10 – 2016-06-15 (×4): 25 mg via ORAL
  Filled 2016-06-10 (×4): qty 1
  Filled 2016-06-10: qty 2

## 2016-06-10 MED ORDER — HYDROMORPHONE HCL 2 MG/ML IJ SOLN
2.0000 mg | INTRAMUSCULAR | Status: DC
  Administered 2016-06-10 (×5): 2 mg via INTRAVENOUS
  Filled 2016-06-10 (×5): qty 1

## 2016-06-10 MED ORDER — HYDROMORPHONE HCL 2 MG/ML IJ SOLN
2.0000 mg | INTRAMUSCULAR | Status: DC | PRN
  Administered 2016-06-10 – 2016-06-11 (×4): 2 mg via INTRAVENOUS
  Filled 2016-06-10 (×4): qty 1

## 2016-06-10 MED ORDER — SODIUM CHLORIDE 0.9% FLUSH
10.0000 mL | INTRAVENOUS | Status: DC | PRN
  Administered 2016-06-14: 10 mL
  Filled 2016-06-10: qty 40

## 2016-06-10 MED ORDER — SODIUM CHLORIDE 0.9% FLUSH: 9.0000 mL | INTRAVENOUS | Status: DC | PRN

## 2016-06-10 MED ORDER — HYDROMORPHONE HCL 2 MG/ML IJ SOLN: 2.0000 mg | INTRAMUSCULAR | Status: DC

## 2016-06-10 MED ORDER — DIPHENHYDRAMINE HCL 50 MG/ML IJ SOLN
25.0000 mg | Freq: Once | INTRAMUSCULAR | Status: AC
  Administered 2016-06-10: 25 mg via INTRAVENOUS
  Filled 2016-06-10: qty 1

## 2016-06-10 MED ORDER — ENOXAPARIN SODIUM 40 MG/0.4ML ~~LOC~~ SOLN: 40.0000 mg | SUBCUTANEOUS | Status: DC

## 2016-06-10 MED ORDER — NALOXONE HCL 0.4 MG/ML IJ SOLN: 0.4000 mg | INTRAMUSCULAR | Status: DC | PRN

## 2016-06-10 MED ORDER — DEXTROSE-NACL 5-0.45 % IV SOLN
INTRAVENOUS | Status: DC
  Administered 2016-06-10 – 2016-06-15 (×13): via INTRAVENOUS

## 2016-06-10 MED ORDER — RIVAROXABAN 20 MG PO TABS
20.0000 mg | ORAL_TABLET | Freq: Every day | ORAL | Status: DC
  Administered 2016-06-10 – 2016-06-14 (×5): 20 mg via ORAL
  Filled 2016-06-10 (×6): qty 1

## 2016-06-10 MED ORDER — SENNOSIDES-DOCUSATE SODIUM 8.6-50 MG PO TABS
1.0000 | ORAL_TABLET | Freq: Two times a day (BID) | ORAL | Status: DC
  Administered 2016-06-10 – 2016-06-15 (×10): 1 via ORAL
  Filled 2016-06-10 (×12): qty 1

## 2016-06-10 MED ORDER — VALACYCLOVIR HCL 500 MG PO TABS
1000.0000 mg | ORAL_TABLET | Freq: Every day | ORAL | Status: DC | PRN
  Filled 2016-06-10: qty 2

## 2016-06-10 MED ORDER — SODIUM CHLORIDE 0.9 % IV SOLN
25.0000 mg | INTRAVENOUS | Status: DC | PRN
  Filled 2016-06-10: qty 0.5

## 2016-06-10 MED ORDER — POLYETHYLENE GLYCOL 3350 17 G PO PACK
17.0000 g | PACK | Freq: Every day | ORAL | Status: DC | PRN
  Administered 2016-06-12: 17 g via ORAL
  Filled 2016-06-10: qty 1

## 2016-06-10 MED ORDER — HYDROXYUREA 500 MG PO CAPS
1000.0000 mg | ORAL_CAPSULE | Freq: Every day | ORAL | Status: DC
  Administered 2016-06-10 – 2016-06-15 (×6): 1000 mg via ORAL
  Filled 2016-06-10 (×6): qty 2

## 2016-06-10 NOTE — ED Notes (Signed)
[facility] agrees not to give pt pain med at this time until b/p improves.  Pt refuses to leave on EMS stretcher until after she receives more pain meds.  Gearlean Alf RN in to talk to pt.

## 2016-06-10 NOTE — ED Notes (Signed)
Placed order for breakfast tray. Per pt, ordered "scrambled eggs, pork sausage, grits, biscuit, 4 butters, and 4 salts."

## 2016-06-10 NOTE — ED Notes (Signed)
Pt taken to restroom via wheelchair.

## 2016-06-10 NOTE — Progress Notes (Signed)
  Echocardiogram 2D Echocardiogram has been performed.  Darlina Sicilian M 06/10/2016, 1:20 PM

## 2016-06-10 NOTE — ED Notes (Signed)
Went into room with [facility] to give pain med per pt's request.  Pt was supine on stretcher with eyes closed   B/P 97/68.  Med will not be given at this time due to blood pressure.

## 2016-06-10 NOTE — H&P (Signed)
History and Physical    [name] [id number] DOB: 09-11-1990 DOA: 06/10/2016   PCP: Angelica Chessman, MD Chief Complaint:  Chief Complaint  Patient presents with  . Loss of Consciousness  . Sickle Cell Pain Crisis    HPI: [name] is a 26 y.o. female with medical history significant of sickle cell disease requiring frequent admissions who presents to the ED following a witnessed syncopal episode earlier this evening. According to medic report, this patient was walking throughout her home this evening when family members saw the patient syncopize, fall forward from standing. She struck her face against the floor. No obvious trauma with medic. She has been ambulatory with medic. At interview, the patient is reporting midline back pain which she believes is the result of her fall. Pain is 10/10 in severity. She denies any preceding symptoms prior to her fall. She is also reporting 9/10 pain along her L arm for 2 days which is consistent with her typical sickle cell pain crisis presentations. She denies any nausea or vomiting. Denies any chest pain or head pain. She denies any changes in sensation or strength distally. She has no other complaints at this time.  Patient denies overuse of narcotic medications at home and states she is taking them as prescribed.   ED Course: Workup negative for fall injury.  Multiple rounds of dilaudid have failed to control pain.    Review of Systems: As per HPI otherwise 10 point review of systems negative.    Past Medical History:  Diagnosis Date  . Blood transfusion    "lots"  . Blood transfusion without reported diagnosis   . Chronic back pain    "very severe; have knot in my back; from tight muscle; take RX and exercise for it"  . Depression 01/06/2011  . Exertional dyspnea    "sometimes"  . Genital HSV   . GERD (gastroesophageal reflux disease) 02/17/2011  . Migraines 11/08/11   "@ least twice/month"  . 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  . Mood swings ([place]) 11/08/11   "I go back and forth; real bad"  . Sickle cell anemia ([name])   . Sickle cell anemia with crisis ([place])   . Trichotillomania    h/o    Past Surgical History:  Procedure Laterality Date  . CHOLECYSTECTOMY  05/2010  . DILATION AND CURETTAGE OF UTERUS  02/20/11   S/P miscarriage  . IR GENERIC HISTORICAL  12/23/2015   IR FLUORO GUIDE CV LINE RIGHT 12/23/2015 Jacqulynn Cadet, MD [facility]  . IR GENERIC HISTORICAL  12/23/2015   IR US GUIDE VASC ACCESS RIGHT 12/23/2015 Jacqulynn Cadet, MD [facility]     reports that she quit smoking about 3 years ago. Her smoking use included Cigarettes. She has a 0.25 pack-year smoking history. She has never used smokeless tobacco. She reports that she uses drugs, including Other-see comments and Cocaine. She reports that she does not drink alcohol.  Allergies  Allergen Reactions  . Food Hives and Other (See Comments)    Pt states that she is allergic to carrots.   . Latex Rash    Family History  Problem Relation Age of Onset  . Sickle cell trait Mother   . Sickle cell trait Father   . Diabetes Maternal Grandmother   . Diabetes Paternal Grandmother   . Hypertension Paternal Grandmother   . Diabetes Maternal Grandfather       Prior to Admission medications   Medication Sig Start Date End  Date Taking? Authorizing Provider  aspirin-acetaminophen-caffeine (EXCEDRIN MIGRAINE) [phone] MG tablet Take 2 tablets by mouth every 6 (six) hours as needed for headache.    Yes Historical Provider, MD  diphenhydrAMINE (BENADRYL) 25 MG tablet Take 25 mg by mouth at bedtime as needed for sleep.   Yes Historical Provider, MD  hydroxyurea (HYDREA) 500 MG capsule Take 2 capsules (1,000 mg total) by mouth daily. May take with food to minimize GI side effects. 04/18/16  Yes Tresa Garter, MD  ibuprofen (ADVIL,MOTRIN) 600 MG tablet Take 1 tablet (600 mg total) by mouth every 6 (six) hours as needed for mild  pain or moderate pain. 03/14/16  Yes Olugbemiga Essie Christine, MD  morphine (MS CONTIN) 30 MG 12 hr tablet Take 1 tablet (30 mg total) by mouth every 12 (twelve) hours. 05/31/16  Yes Olugbemiga Essie Christine, MD  oxyCODONE (ROXICODONE) 15 MG immediate release tablet Take 1 tablet (15 mg total) by mouth every 4 (four) hours as needed for pain. 05/31/16 06/15/16 Yes Tresa Garter, MD  rivaroxaban (XARELTO) 20 MG TABS tablet Take 20 mg by mouth daily with supper.   Yes Historical Provider, MD  valACYclovir (VALTREX) 1000 MG tablet Take 1 tablet (1,000 mg total) by mouth daily. Patient taking differently: Take 1,000 mg by mouth daily as needed (cold sores).  05/02/16  Yes [name], MD  amoxicillin-clavulanate (AUGMENTIN) 875-125 MG tablet Take 1 tablet by mouth every 12 (twelve) hours. Patient not taking: Reported on 06/10/2016 05/31/16   [name], MD  cyclobenzaprine (FLEXERIL) 10 MG tablet Take 1 tablet (10 mg total) by mouth 3 (three) times daily as needed for muscle spasms. Patient not taking: Reported on 06/10/2016 05/03/16   Tresa Garter, MD    Physical Exam: Vitals:   06/10/16 0145 06/10/16 0200 06/10/16 0215 06/10/16 0230  BP: 110/71 110/71 123/81 119/83  Pulse: 94 97 104 102  Resp: 16 22 19 12   Temp:      TempSrc:      SpO2: 99% 100% 99% 100%  Weight:      Height:          Constitutional: NAD, does appear to have some pain, is sitting up and wide awake despite the massive quantity of dilaudid she has received, is tachycardic to 110s. Eyes: PERRL, lids and conjunctivae normal ENMT: Mucous membranes are moist. Posterior pharynx clear of any exudate or lesions.Normal dentition.  Neck: normal, supple, no masses, no thyromegaly Respiratory: clear to auscultation bilaterally, no wheezing, no crackles. Normal respiratory effort. No accessory muscle use.  Cardiovascular: Tachycardic, regular rhythm, no murmurs / rubs / gallops. No extremity edema. 2+ pedal pulses. No  carotid bruits.  Abdomen: no tenderness, no masses palpated. No hepatosplenomegaly. Bowel sounds positive.  Musculoskeletal: no clubbing / cyanosis. No joint deformity upper and lower extremities. Good ROM, no contractures. Normal muscle tone.  Skin: no rashes, lesions, ulcers. No induration Neurologic: CN 2-12 grossly intact. Sensation intact, DTR normal. Strength 5/5 in all 4.  Psychiatric: Normal judgment and insight. Alert and oriented x 3. Normal mood.    Labs on Admission: I have personally reviewed following labs and imaging studies  CBC:  Recent Labs Lab 06/10/16 0208  WBC 11.5*  NEUTROABS 6.5  HGB 8.2*  HCT 23.5*  MCV 87.7  PLT [id number]*   Basic Metabolic Panel:  Recent Labs Lab 06/10/16 0208  NA 137  K 3.6  CL 105  CO2 23  GLUCOSE 99  BUN 11  CREATININE 0.66  CALCIUM 9.0   GFR: Estimated Creatinine Clearance: 108.9 mL/min (by C-G formula based on SCr of 0.66 mg/dL). Liver Function Tests:  Recent Labs Lab 06/10/16 0208  AST 25  ALT 19  ALKPHOS 51  BILITOT 2.7*  PROT 6.4*  ALBUMIN 4.0   No results for input(s): LIPASE, AMYLASE in the last 168 hours. No results for input(s): AMMONIA in the last 168 hours. Coagulation Profile: No results for input(s): INR, PROTIME in the last 168 hours. Cardiac Enzymes: No results for input(s): CKTOTAL, CKMB, CKMBINDEX, TROPONINI in the last 168 hours. BNP (last 3 results) No results for input(s): PROBNP in the last 8760 hours. HbA1C: No results for input(s): HGBA1C in the last 72 hours. CBG: No results for input(s): GLUCAP in the last 168 hours. Lipid Profile: No results for input(s): CHOL, HDL, LDLCALC, TRIG, CHOLHDL, LDLDIRECT in the last 72 hours. Thyroid Function Tests: No results for input(s): TSH, T4TOTAL, FREET4, T3FREE, THYROIDAB in the last 72 hours. Anemia Panel:  Recent Labs  06/10/16 0208  RETICCTPCT 13.6*   Urine analysis:    Component Value Date/Time   COLORURINE STRAW (A) 06/10/2016 0421    APPEARANCEUR CLEAR 06/10/2016 0421   APPEARANCEUR Clear 05/06/2012 1546   LABSPEC 1.003 (L) 06/10/2016 0421   LABSPEC 1.011 05/06/2012 1546   PHURINE 6.0 06/10/2016 0421   GLUCOSEU NEGATIVE 06/10/2016 0421   GLUCOSEU Negative 05/06/2012 1546   HGBUR NEGATIVE 06/10/2016 0421   HGBUR negative 01/08/2009 1329   BILIRUBINUR NEGATIVE 06/10/2016 0421   BILIRUBINUR Negative 05/06/2012 1546   KETONESUR NEGATIVE 06/10/2016 0421   PROTEINUR NEGATIVE 06/10/2016 0421   UROBILINOGEN 2.0 (H) 10/26/2015 0817   NITRITE NEGATIVE 06/10/2016 0421   LEUKOCYTESUR TRACE (A) 06/10/2016 0421   LEUKOCYTESUR Negative 05/06/2012 1546   Sepsis Labs: @LABRCNTIP (procalcitonin:4,lacticidven:4) ) Recent Results (from the past 240 hour(s))  Culture, group A strep     Status: None   Collection Time: 05/31/16 11:49 AM  Result Value Ref Range Status   Specimen Description THROAT  Final   Special Requests NONE  Final   Culture   Final    NO GROUP A STREP (S.PYOGENES) ISOLATED Performed at [facility], [name] [address] [place], [place] [address]    Report Status 06/03/2016 FINAL  Final     Radiological Exams on Admission: Dg Thoracic Spine W/swimmers  Result Date: 06/10/2016 CLINICAL DATA:  Pain after fall. EXAM: THORACIC SPINE - 3 VIEWS COMPARISON:  None. FINDINGS: There is no evidence of thoracic spine fracture. H-shaped thoracic vertebra consistent with history of sickle cell anemia. Alignment is normal. No other significant bone abnormalities are identified. Port catheter tip is seen in the lower superior vena cava. IMPRESSION: No acute osseous abnormality of the thoracic spine. Port catheter tip in the distal SVC. H-shaped vertebral bodies consistent with sickle-cell. Electronically Signed   By: Ashley Royalty M.D.   On: 06/10/2016 03:43   Dg Lumbar Spine Complete  Result Date: 06/10/2016 CLINICAL DATA:  Patient fell.  Syncope.  Back pain. EXAM: LUMBAR SPINE - COMPLETE 4+ VIEW COMPARISON:  None. FINDINGS:  There is no evidence of lumbar spine fracture. Alignment is normal. Intervertebral disc spaces are maintained. H-shaped vertebral bodies throughout the visualized thoracolumbar spine would be in keeping with vertebral body changes related to sickle cell anemia. Cholecystectomy. IMPRESSION: H-shaped vertebral bodies consistent with sickle cell anemia. No acute osseous abnormality. Electronically Signed   By: Ashley Royalty M.D.   On: 06/10/2016 03:39   Ct Head Wo Contrast  Result Date: 06/10/2016 CLINICAL DATA:  Sickle-cell crisis and migraine. Patient fell. Syncope. EXAM: CT HEAD WITHOUT CONTRAST CT CERVICAL SPINE WITHOUT CONTRAST TECHNIQUE: Multidetector CT imaging of the head and cervical spine was performed following the standard protocol without intravenous contrast. Multiplanar CT image reconstructions of the cervical spine were also generated. COMPARISON:  None. FINDINGS: CT HEAD FINDINGS Brain: No acute intracranial hemorrhage, large vascular territory infarction, edema or midline shift. Ventricles and sulci are within normal limits for age. Parafalcine calcifications are noted anteriorly. Vascular: No large vascular territory infarct. No hyperdense vessels. No unexpected calcifications. Skull: Thickened bony calvarium consistent with history of sickle cell anemia. No acute fracture. Sinuses/Orbits: No acute finding. Other: None. CT CERVICAL SPINE FINDINGS Alignment: Normal Skull base and vertebrae: Thickened bony calvarium consistent with history of sickle cell. H-shaped vertebral bodies noted of the T1 and T2 vertebral bodies consistent with sickle cell. Soft tissues and spinal canal: No prevertebral fluid or swelling. No visible canal hematoma. Disc levels: No focal disc herniation. No significant neural foraminal encroachment. Upper chest: Negative. Other: None IMPRESSION: Thickened bony calvarium and and H-shaped upper thoracic vertebra consistent with sickle cell anemia. No acute intracranial nor  cervical spinal abnormality. Electronically Signed   By: Ashley Royalty M.D.   On: 06/10/2016 03:49   Ct Cervical Spine Wo Contrast  Result Date: 06/10/2016 CLINICAL DATA:  Sickle-cell crisis and migraine. Patient fell. Syncope. EXAM: CT HEAD WITHOUT CONTRAST CT CERVICAL SPINE WITHOUT CONTRAST TECHNIQUE: Multidetector CT imaging of the head and cervical spine was performed following the standard protocol without intravenous contrast. Multiplanar CT image reconstructions of the cervical spine were also generated. COMPARISON:  None. FINDINGS: CT HEAD FINDINGS Brain: No acute intracranial hemorrhage, large vascular territory infarction, edema or midline shift. Ventricles and sulci are within normal limits for age. Parafalcine calcifications are noted anteriorly. Vascular: No large vascular territory infarct. No hyperdense vessels. No unexpected calcifications. Skull: Thickened bony calvarium consistent with history of sickle cell anemia. No acute fracture. Sinuses/Orbits: No acute finding. Other: None. CT CERVICAL SPINE FINDINGS Alignment: Normal Skull base and vertebrae: Thickened bony calvarium consistent with history of sickle cell. H-shaped vertebral bodies noted of the T1 and T2 vertebral bodies consistent with sickle cell. Soft tissues and spinal canal: No prevertebral fluid or swelling. No visible canal hematoma. Disc levels: No focal disc herniation. No significant neural foraminal encroachment. Upper chest: Negative. Other: None IMPRESSION: Thickened bony calvarium and and H-shaped upper thoracic vertebra consistent with sickle cell anemia. No acute intracranial nor cervical spinal abnormality. Electronically Signed   By: Ashley Royalty M.D.   On: 06/10/2016 03:49   Dg Humerus Left  Result Date: 06/10/2016 CLINICAL DATA:  Patient fell.  Sickle cell anemia. EXAM: LEFT HUMERUS - 2+ VIEW COMPARISON:  05/22/2016 left shoulder radiographs FINDINGS: Informed sclerotic appearance of the left humeral head consistent  with osteonecrosis. No loss of the sphericity of the humeral head on current exam. No acute fracture nor bone destruction. IMPRESSION: Mild sclerosis of the humeral head consistent with osteonecrosis. No acute osseous abnormality. Electronically Signed   By: Ashley Royalty M.D.   On: 06/10/2016 03:41    EKG: Independently reviewed.  Assessment/Plan Principal Problem:   Sickle cell pain crisis ([place]) Active Problems:   Syncope    1. Sickle cell pain crisis - 1. Sickle cell pain pathway 2. Dilaudid PCA per her standing order dosing 3. Scheduled ketorlac 4. Continue home MS contin, hold home oxy 5. Continue hydrea 2. Syncope - 1. This patient  has persistent tachycardia to 110s, history of L arm DVT, and actually STILL had L arm "subacute" DVT when they re-ultrasounded her L arm a mere 11 days ago.  Patient admits to missing "about 3" days worth of xarelto because she ran out and "needs to get this refilled". 2. Given the above, I am concerned about the possibility of PE in this patient, and she is not felt to be low risk. 3. Will order CTA chest PE study to r/o PE 4. 2d echo 5. Tele monitor   DVT prophylaxis: Lovenox Code Status: Full Family Communication: No family in room Consults called: None Admission status: Admit to inpatient   Etta Quill DO [facility] Pager [phone] from 7PM-7AM  If 7AM-7PM, please contact the day physician for the patient [email or web address] Password [email or web address]  06/10/2016, 5:18 AM

## 2016-06-10 NOTE — ED Provider Notes (Signed)
[facility] DEPT Provider Note   CSN: [id number] Arrival date & time: 06/10/16  0100  By signing my name below, I, [name], attest that this documentation has been prepared under the direction and in the presence of Delora Fuel, MD. Electronically Signed: Oleh [name], Scribe. 06/10/16. 1:20 AM.   History   Chief Complaint Chief Complaint  Patient presents with  . Loss of Consciousness  . Sickle Cell Pain Crisis    HPI [name] is a 26 y.o. female with history of sickle cell disease requiring frequent admissions who presents to the ED following a witnessed syncopal episode earlier this evening. According to medic report, this patient was walking throughout her home this evening when family members saw the patient syncopize, fall forward from standing. She struck her face against the floor. No obvious trauma with medic. She has been ambulatory with medic. At interview, the patient is reporting midline back pain which she believes is the result of her fall. Pain is 10/10 in severity. She denies any preceding symptoms prior to her fall. She is also reporting 9/10 pain along her L arm for 2 days which is consistent with her typical sickle cell pain crisis presentations. She denies any nausea or vomiting. Denies any chest pain or head pain. She denies any changes in sensation or strength distally. She has no other complaints at this time.  The history is provided by the patient. No language interpreter was used.    Past Medical History:  Diagnosis Date  . Blood transfusion    "lots"  . Blood transfusion without reported diagnosis   . Chronic back pain    "very severe; have knot in my back; from tight muscle; take RX and exercise for it"  . Depression 01/06/2011  . Exertional dyspnea    "sometimes"  . Genital HSV   . GERD (gastroesophageal reflux disease) 02/17/2011  . Migraines 11/08/11   "@ least twice/month"  . Miscarriage 03/22/2011   Pt reports 2  miscarriages.  . Mood swings ([place]) 11/08/11   "I go back and forth; real bad"  . Sickle cell anemia ([name])   . Sickle cell anemia with crisis ([place])   . Trichotillomania    h/o    Patient Active Problem List   Diagnosis Date Noted  . Sickle cell anemia with crisis ([place]) 05/29/2016  . Sickle cell anemia ([place]) 05/19/2016  . Vasoocclusive sickle cell crisis ([place]) 05/19/2016  . Acute deep vein thrombosis (DVT) of left upper extremity ([place]) 04/17/2016  . Post partum depression 04/03/2016  . 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 Sickle cell anemia with pain ([place]) 02/16/2016  . Thrombocytosis ([place]) 11/09/2015  . Hyperbilirubinemia 11/09/2015  . Pyrexia   . Narcotic dependence ([place]) 10/06/2015  . 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 High risk for intrapartum complications, antepartum 08/12/2015  . Chronic pain 08/04/2015  . Cocaine use 07/24/2015  . Herpes simplex 07/14/2015  . Anemia of chronic disease   . Leukocytosis   . 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 Sickle cell pain crisis ([place]) 03/29/2012  . 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 Sickle cell disease ([place]) 01/08/2009  . 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 CHOLECYSTECTOMY  05/2010  . DILATION AND CURETTAGE OF UTERUS  02/20/11   S/P miscarriage  . IR GENERIC HISTORICAL  12/23/2015   IR FLUORO GUIDE CV LINE RIGHT 12/23/2015 Jacqulynn Cadet, MD [facility]  . IR  GENERIC HISTORICAL  12/23/2015   IR US GUIDE VASC ACCESS RIGHT 12/23/2015 Jacqulynn Cadet, MD [facility]    OB History    Gravida Para Term Preterm AB Living   3 2 2   1 2    SAB TAB Ectopic Multiple Live Births   1     0 2      Obstetric Comments   Miscarried in October 2012 at about 7 weeks       Home Medications    Prior to Admission medications   Medication Sig Start Date End Date Taking? Authorizing Provider  amoxicillin-clavulanate (AUGMENTIN) 875-125 MG  tablet Take 1 tablet by mouth every 12 (twelve) hours. 05/31/16   [name], MD  aspirin-acetaminophen-caffeine (EXCEDRIN MIGRAINE) [phone] MG tablet Take 2 tablets by mouth every 6 (six) hours as needed for headache.     Historical Provider, MD  cyclobenzaprine (FLEXERIL) 10 MG tablet Take 1 tablet (10 mg total) by mouth 3 (three) times daily as needed for muscle spasms. 05/03/16   Tresa Garter, MD  diphenhydrAMINE (BENADRYL) 25 MG tablet Take 25 mg by mouth at bedtime as needed for sleep.    Historical Provider, MD  hydroxyurea (HYDREA) 500 MG capsule Take 2 capsules (1,000 mg total) by mouth daily. May take with food to minimize GI side effects. 04/18/16   Tresa Garter, MD  ibuprofen (ADVIL,MOTRIN) 600 MG tablet Take 1 tablet (600 mg total) by mouth every 6 (six) hours as needed for mild pain or moderate pain. 03/14/16   Tresa Garter, MD  morphine (MS CONTIN) 30 MG 12 hr tablet Take 1 tablet (30 mg total) by mouth every 12 (twelve) hours. 05/31/16   Tresa Garter, MD  oxyCODONE (ROXICODONE) 15 MG immediate release tablet Take 1 tablet (15 mg total) by mouth every 4 (four) hours as needed for pain. 05/31/16 06/15/16  Tresa Garter, MD  rivaroxaban (XARELTO) 20 MG TABS tablet Take 20 mg by mouth daily with supper.    Historical Provider, MD  valACYclovir (VALTREX) 1000 MG tablet Take 1 tablet (1,000 mg total) by mouth daily. Patient taking differently: Take 1,000 mg by mouth daily as needed (cold sores).  05/02/16   [name], MD    Family History Family History  Problem Relation Age of Onset  . Sickle cell trait Mother   . Sickle cell trait Father   . Diabetes Maternal Grandmother   . Diabetes Paternal Grandmother   . Hypertension Paternal Grandmother   . Diabetes Maternal Grandfather     Social History Social History  Substance Use Topics  . Smoking status: Former Smoker    Packs/day: 0.25    Years: 1.00    Types: Cigarettes    Quit  date: 03/25/2013  . Smokeless tobacco: Never Used  . Alcohol use No     Comment: pt states she quit marijuan in May 2013. Rare ETOH, + cigarettes.  She is enrolled in school     Allergies   Food and Latex   Review of Systems Review of Systems  Musculoskeletal: Positive for back pain.  Neurological: Positive for syncope. Negative for weakness, numbness and headaches.  All other systems reviewed and are negative.    Physical Exam Updated Vital Signs SpO2 97%   Physical Exam  Constitutional: She is oriented to person, place, and time. She appears well-developed and well-nourished.  HENT:  Head: Normocephalic and atraumatic.  Eyes: EOM are normal. Pupils are equal, round, and reactive to light.  Neck: Normal range of motion. Neck supple. No JVD present.  Cardiovascular: Normal rate, regular rhythm and normal heart sounds.   No murmur heard. Pulmonary/Chest: Effort normal and breath sounds normal. She has no wheezes. She has no rales. She exhibits no tenderness.  There is a MediPort present in her L anterior chest  Abdominal: Soft. Bowel sounds are normal. She exhibits no distension and no mass. There is no tenderness.  Musculoskeletal: She exhibits no edema.  There is pain while ranging the patient's L shoulder. There is tenderness to the entire thoracic and lumbar spine. Tenderness to the L upper arm.   Lymphadenopathy:    She has no cervical adenopathy.  Neurological: She is alert and oriented to person, place, and time. No cranial nerve deficit. She exhibits normal muscle tone. Coordination normal.  Skin: Skin is warm and dry. No rash noted.  Psychiatric: She has a normal mood and affect. Her behavior is normal. Judgment and thought content normal.  Nursing note and vitals reviewed.    ED Treatments / Results  DIAGNOSTIC STUDIES: Oxygen Saturation is 97 percent on room air which is normal by my interpretation.    COORDINATION OF CARE: 1:23 AM Discussed treatment plan  with pt at bedside and pt agreed to plan.  Labs (all labs ordered are listed, but only abnormal results are displayed) Labs Reviewed  CBC WITH DIFFERENTIAL/PLATELET - Abnormal; Notable for the following:       Result Value   WBC 11.5 (*)    RBC 2.68 (*)    Hemoglobin 8.2 (*)    HCT 23.5 (*)    RDW 17.5 (*)    Platelets 551 (*)    Monocytes Absolute 1.4 (*)    All other components within normal limits  COMPREHENSIVE METABOLIC PANEL - Abnormal; Notable for the following:    Total Protein 6.4 (*)    Total Bilirubin 2.7 (*)    All other components within normal limits  URINALYSIS, ROUTINE W REFLEX MICROSCOPIC - Abnormal; Notable for the following:    Color, Urine STRAW (*)    Specific Gravity, Urine 1.003 (*)    Leukocytes, UA TRACE (*)    Squamous Epithelial / LPF 0-5 (*)    All other components within normal limits  RETICULOCYTES - Abnormal; Notable for the following:    Retic Ct Pct 13.6 (*)    RBC. 2.68 (*)    Retic Count, Manual 364.5 (*)    All other components within normal limits  I-STAT BETA HCG BLOOD, ED ([facility], [facility], [facility] ONLY)    EKG  EKG Interpretation  Date/Time:  Friday June 10 2016 01:03:01 EST Ventricular Rate:  99 PR Interval:  158 QRS Duration: 84 QT Interval:  358 QTC Calculation: 459 R Axis:   49 Text Interpretation:  Normal sinus rhythm Possible Left atrial enlargement Abnormal ECG When compared with ECG of 05/06/2016, No significant change was found Confirmed by [facility]  MD, Sedrick Tober ([id number]) on 06/10/2016 1:08:23 AM       Radiology Dg Thoracic Spine W/swimmers  Result Date: 06/10/2016 CLINICAL DATA:  Pain after fall. EXAM: THORACIC SPINE - 3 VIEWS COMPARISON:  None. FINDINGS: There is no evidence of thoracic spine fracture. H-shaped thoracic vertebra consistent with history of sickle cell anemia. Alignment is normal. No other significant bone abnormalities are identified. Port catheter tip is seen in the lower superior vena cava. IMPRESSION: No acute  osseous abnormality of the thoracic spine. Port catheter tip in the distal SVC. H-shaped vertebral bodies consistent  with sickle-cell. Electronically Signed   By: Ashley Royalty M.D.   On: 06/10/2016 03:43   Dg Lumbar Spine Complete  Result Date: 06/10/2016 CLINICAL DATA:  Patient fell.  Syncope.  Back pain. EXAM: LUMBAR SPINE - COMPLETE 4+ VIEW COMPARISON:  None. FINDINGS: There is no evidence of lumbar spine fracture. Alignment is normal. Intervertebral disc spaces are maintained. H-shaped vertebral bodies throughout the visualized thoracolumbar spine would be in keeping with vertebral body changes related to sickle cell anemia. Cholecystectomy. IMPRESSION: H-shaped vertebral bodies consistent with sickle cell anemia. No acute osseous abnormality. Electronically Signed   By: Ashley Royalty M.D.   On: 06/10/2016 03:39   Ct Head Wo Contrast  Result Date: 06/10/2016 CLINICAL DATA:  Sickle-cell crisis and migraine. Patient fell. Syncope. EXAM: CT HEAD WITHOUT CONTRAST CT CERVICAL SPINE WITHOUT CONTRAST TECHNIQUE: Multidetector CT imaging of the head and cervical spine was performed following the standard protocol without intravenous contrast. Multiplanar CT image reconstructions of the cervical spine were also generated. COMPARISON:  None. FINDINGS: CT HEAD FINDINGS Brain: No acute intracranial hemorrhage, large vascular territory infarction, edema or midline shift. Ventricles and sulci are within normal limits for age. Parafalcine calcifications are noted anteriorly. Vascular: No large vascular territory infarct. No hyperdense vessels. No unexpected calcifications. Skull: Thickened bony calvarium consistent with history of sickle cell anemia. No acute fracture. Sinuses/Orbits: No acute finding. Other: None. CT CERVICAL SPINE FINDINGS Alignment: Normal Skull base and vertebrae: Thickened bony calvarium consistent with history of sickle cell. H-shaped vertebral bodies noted of the T1 and T2 vertebral bodies  consistent with sickle cell. Soft tissues and spinal canal: No prevertebral fluid or swelling. No visible canal hematoma. Disc levels: No focal disc herniation. No significant neural foraminal encroachment. Upper chest: Negative. Other: None IMPRESSION: Thickened bony calvarium and and H-shaped upper thoracic vertebra consistent with sickle cell anemia. No acute intracranial nor cervical spinal abnormality. Electronically Signed   By: Ashley Royalty M.D.   On: 06/10/2016 03:49   Ct Cervical Spine Wo Contrast  Result Date: 06/10/2016 CLINICAL DATA:  Sickle-cell crisis and migraine. Patient fell. Syncope. EXAM: CT HEAD WITHOUT CONTRAST CT CERVICAL SPINE WITHOUT CONTRAST TECHNIQUE: Multidetector CT imaging of the head and cervical spine was performed following the standard protocol without intravenous contrast. Multiplanar CT image reconstructions of the cervical spine were also generated. COMPARISON:  None. FINDINGS: CT HEAD FINDINGS Brain: No acute intracranial hemorrhage, large vascular territory infarction, edema or midline shift. Ventricles and sulci are within normal limits for age. Parafalcine calcifications are noted anteriorly. Vascular: No large vascular territory infarct. No hyperdense vessels. No unexpected calcifications. Skull: Thickened bony calvarium consistent with history of sickle cell anemia. No acute fracture. Sinuses/Orbits: No acute finding. Other: None. CT CERVICAL SPINE FINDINGS Alignment: Normal Skull base and vertebrae: Thickened bony calvarium consistent with history of sickle cell. H-shaped vertebral bodies noted of the T1 and T2 vertebral bodies consistent with sickle cell. Soft tissues and spinal canal: No prevertebral fluid or swelling. No visible canal hematoma. Disc levels: No focal disc herniation. No significant neural foraminal encroachment. Upper chest: Negative. Other: None IMPRESSION: Thickened bony calvarium and and H-shaped upper thoracic vertebra consistent with sickle cell  anemia. No acute intracranial nor cervical spinal abnormality. Electronically Signed   By: Ashley Royalty M.D.   On: 06/10/2016 03:49   Dg Humerus Left  Result Date: 06/10/2016 CLINICAL DATA:  Patient fell.  Sickle cell anemia. EXAM: LEFT HUMERUS - 2+ VIEW COMPARISON:  05/22/2016 left shoulder radiographs FINDINGS: Informed sclerotic  appearance of the left humeral head consistent with osteonecrosis. No loss of the sphericity of the humeral head on current exam. No acute fracture nor bone destruction. IMPRESSION: Mild sclerosis of the humeral head consistent with osteonecrosis. No acute osseous abnormality. Electronically Signed   By: Ashley Royalty M.D.   On: 06/10/2016 03:41    Procedures Procedures (including critical care time)  Medications Ordered in ED Medications  HYDROmorphone (DILAUDID) injection 2 mg (2 mg Intravenous Given 06/10/16 0532)    Or  HYDROmorphone (DILAUDID) injection 2 mg ( Subcutaneous See Alternative 06/10/16 0532)  ondansetron (ZOFRAN) injection 4 mg (4 mg Intravenous Given 06/10/16 0333)  sodium chloride flush (NS) 0.9 % injection 10-40 mL (10 mLs Intracatheter Not Given 06/10/16 0336)  sodium chloride flush (NS) 0.9 % injection 10-40 mL (not administered)  senna-docusate (Senokot-S) tablet 1 tablet (not administered)  polyethylene glycol (MIRALAX / GLYCOLAX) packet 17 g (not administered)  ketorolac (TORADOL) 30 MG/ML injection 30 mg (30 mg Intravenous Given 06/10/16 0535)  naloxone (NARCAN) injection 0.4 mg (not administered)    And  sodium chloride flush (NS) 0.9 % injection 9 mL (not administered)  diphenhydrAMINE (BENADRYL) capsule 25-50 mg (not administered)    Or  diphenhydrAMINE (BENADRYL) 25 mg in sodium chloride 0.9 % 50 mL IVPB (not administered)  HYDROmorphone (DILAUDID) 1 mg/mL PCA injection (not administered)  rivaroxaban (XARELTO) tablet 20 mg (not administered)  valACYclovir (VALTREX) tablet 1,000 mg (not administered)  morphine (MS CONTIN) 12 hr tablet 30  mg (not administered)  hydroxyurea (HYDREA) capsule 1,000 mg (not administered)  dextrose 5 %-0.45 % sodium chloride infusion (not administered)  ketorolac (TORADOL) 30 MG/ML injection 30 mg (30 mg Intravenous Given 06/10/16 0218)  HYDROmorphone (DILAUDID) injection 2 mg (2 mg Intravenous Given 06/10/16 0218)    Or  HYDROmorphone (DILAUDID) injection 2 mg ( Subcutaneous See Alternative 06/10/16 0218)  HYDROmorphone (DILAUDID) injection 2 mg (2 mg Intravenous Given 06/10/16 0333)    Or  HYDROmorphone (DILAUDID) injection 2 mg ( Subcutaneous See Alternative 06/10/16 0333)  HYDROmorphone (DILAUDID) injection 2 mg (2 mg Intravenous Given 06/10/16 0431)    Or  HYDROmorphone (DILAUDID) injection 2 mg ( Subcutaneous See Alternative 06/10/16 0431)  diphenhydrAMINE (BENADRYL) injection 25 mg (25 mg Intravenous Given 06/10/16 0218)  sodium chloride 0.45 % bolus 1,000 mL (1,000 mLs Intravenous New Bag/Given 06/10/16 0331)  diphenhydrAMINE (BENADRYL) injection 25 mg (25 mg Intravenous Given 06/10/16 0455)  iopamidol (ISOVUE-370) 76 % injection (100 mLs  Contrast Given 06/10/16 0557)     Initial Impression / Assessment and Plan / ED Course  I have reviewed the triage vital signs and the nursing notes.  Pertinent labs & imaging results that were available during my care of the patient were reviewed by me and considered in my medical decision making (see chart for details).  Patient is to significant complaints. Syncopal episode which is concerning because she had no warning that she was going to pass out. Also, she has a sickle cell painful crisis. She started on sickle cell protocol. She sent for CT of head which is unremarkable. Back pain and arm pain which may be from fall and may be from sickle cell disease-x-rays obtained which showed no acute injury. Laboratory workup showed stable anemia. CT of head and cervical spine were unremarkable. She will failed to get adequate pain relief with 3 doses of  hydromorphone. She did have a brief period of hypotension and was given IV fluids with improvement in blood pressure. Case is discussed  with Dr. Alcario Drought of [facility] who will come to admit her for both sickle cell crisis and syncope evaluation. Old records are reviewed, and she has multiple admissions for sickle cell disease, and also has a recent admission for pulmonary embolism.  Final Clinical Impressions(s) / ED Diagnoses   Final diagnoses:  Fall at home, initial encounter  Sickle cell crisis ([place])  Syncope, unspecified syncope type  Chronic anticoagulation    New Prescriptions New Prescriptions   No medications on file    I personally performed the services described in this documentation, which was scribed in my presence. The recorded information has been reviewed and is accurate.     Delora Fuel, MD [id number]

## 2016-06-10 NOTE — ED Notes (Signed)
Pt taken to [facility] 

## 2016-06-10 NOTE — ED Triage Notes (Signed)
Pt from home by EMS. Family called after pt passed out and fell face-forward from standing. No obvious facial trauma or pain for EMS. Pt reported sickle-cell crisis with 9/10 pain in her L arm for the last day. Also reports bilat flank pain. Per EMS, pt CAOx4 on scene and able to ambulate well.

## 2016-06-10 NOTE — ED Notes (Signed)
Patient transported to CT 

## 2016-06-10 NOTE — Progress Notes (Signed)
Subjective: Patient admitted this morning, see detailed H&P by Dr Alcario Drought,   26 y.o. female with medical history significant ofsickle cell disease requiring frequent admissions who presents to the ED following a witnessed syncopal episode earlier this evening. According to medic report, this patient was walking throughout her home this evening when family members saw the patient syncopize, fall forward from standing. She struck her face against the floor.   This morning patient still complains of pain, was started on Dilaudid PCA pump. CT angiogram was inconclusive for pulmonary embolism, VQ scan recommended.  Vitals:   06/10/16 0949 06/10/16 1115  BP: 106/63 123/66  Pulse: 108 88  Resp: 18 13  Temp:        A/P Sickle cell pain crisis Syncope  Continue Dilaudid PCA Will order VQ scan to rule out pulmonary embolism Patient will be transferred [facility] when bed becomes available.    [facility] Hospitalist [phone]

## 2016-06-10 NOTE — ED Notes (Signed)
Patient transported to X-ray 

## 2016-06-11 DIAGNOSIS — R400 Somnolence: Secondary | ICD-10-CM | POA: Diagnosis not present

## 2016-06-11 DIAGNOSIS — G43909 Migraine, unspecified, not intractable, without status migrainosus: Secondary | ICD-10-CM | POA: Diagnosis not present

## 2016-06-11 DIAGNOSIS — R55 Syncope and collapse: Secondary | ICD-10-CM

## 2016-06-11 DIAGNOSIS — Z7901 Long term (current) use of anticoagulants: Secondary | ICD-10-CM

## 2016-06-11 DIAGNOSIS — D72829 Elevated white blood cell count, unspecified: Secondary | ICD-10-CM | POA: Diagnosis not present

## 2016-06-11 DIAGNOSIS — G894 Chronic pain syndrome: Secondary | ICD-10-CM

## 2016-06-11 DIAGNOSIS — D638 Anemia in other chronic diseases classified elsewhere: Secondary | ICD-10-CM | POA: Diagnosis not present

## 2016-06-11 DIAGNOSIS — D5700 Hb-SS disease with crisis, unspecified: Principal | ICD-10-CM

## 2016-06-11 DIAGNOSIS — F1110 Opioid abuse, uncomplicated: Secondary | ICD-10-CM | POA: Diagnosis not present

## 2016-06-11 LAB — BASIC METABOLIC PANEL
Anion gap: 3 — ABNORMAL LOW (ref 5–15)
BUN: 9 mg/dL (ref 6–20)
CALCIUM: 8.5 mg/dL — AB (ref 8.9–10.3)
CO2: 25 mmol/L (ref 22–32)
Chloride: 110 mmol/L (ref 101–111)
Creatinine, Ser: 0.53 mg/dL (ref 0.44–1.00)
GFR calc Af Amer: >60 mL/min (ref >60)
GLUCOSE: 101 mg/dL — AB (ref 65–99)
Potassium: 4.1 mmol/L (ref 3.5–5.1)
Sodium: 138 mmol/L (ref 135–145)

## 2016-06-11 MED ORDER — HYDROMORPHONE 1 MG/ML IV SOLN
INTRAVENOUS | Status: DC
  Administered 2016-06-11: 5.2 mg via INTRAVENOUS
  Administered 2016-06-11: 4.8 mg via INTRAVENOUS
  Administered 2016-06-12: 25 mg via INTRAVENOUS
  Administered 2016-06-12: 7.2 mg via INTRAVENOUS
  Administered 2016-06-12: 1 mg via INTRAVENOUS
  Administered 2016-06-12: 4.2 mg via INTRAVENOUS
  Administered 2016-06-12: 6 mg via INTRAVENOUS
  Administered 2016-06-12: 4.8 mg via INTRAVENOUS
  Administered 2016-06-13: 5.4 mg via INTRAVENOUS
  Administered 2016-06-13: 1.8 mg via INTRAVENOUS
  Administered 2016-06-13: 3 mg via INTRAVENOUS
  Administered 2016-06-13: 25 mg via INTRAVENOUS
  Administered 2016-06-13: 7.2 mg via INTRAVENOUS
  Administered 2016-06-13: 7.8 mg via INTRAVENOUS
  Administered 2016-06-14: 1.8 mg via INTRAVENOUS
  Administered 2016-06-14: 4.8 mg via INTRAVENOUS
  Administered 2016-06-14: 3.6 mg via INTRAVENOUS
  Administered 2016-06-14: 6.6 mg via INTRAVENOUS
  Administered 2016-06-14: 25 mg via INTRAVENOUS
  Filled 2016-06-11 (×4): qty 25

## 2016-06-11 MED ORDER — HYDROMORPHONE HCL 1 MG/ML IJ SOLN
1.0000 mg | Freq: Once | INTRAMUSCULAR | Status: AC
  Administered 2016-06-11: 1 mg via INTRAVENOUS
  Filled 2016-06-11: qty 1

## 2016-06-11 NOTE — Progress Notes (Signed)
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 Syncope: So far all studies negative. Will obtain MRA to evaluate cerebral vasculature.  2. Hb SS with crisis: Pt feels that her pain of sickle cell increased after she fell during syncopal episode. Decrease PCA bolus dose to 0.6 mg as patient somnolent after current PCA bolus dose. Continue Toradol, MS Contin and IVF. Will consider scheduling Oxycodone once renal function assessed. 3. Leukocytosis: Pt has mild elevation in WBC but no evidence of infection.  4. Anemia of chronic disease. Hb stable. Continue Hydrea. 5. Chronic Anticoagulation: Pt on Xarelto chronically for DVT. Continue.  6. Chronic pain: Continue MS Contin   Code Status: Full Code Family Communication: N/A Disposition Plan: Not yet ready for discharge  [place].  Pager [phone]. If 7PM-7AM, please contact night-coverage.  06/11/2016, 12:18 PM  LOS: 1 day   Interim History: Pt reports pain in LUE characteristic of sickle cell crisis at an intensity of 8/10. However she also has some pain in the upper back which she reports came on after she fell. She has used 34.2 mg with 47/46: demands/deliveries in the last 24 hours.  Consultants:  None  Procedures:  None  Antibiotics:  None   Objective: Vitals:   06/11/16 0543 06/11/16 0700 06/11/16 0800 06/11/16 1147  BP: 108/67 128/71    Pulse: 82 97    Resp: 18 11 13 12   Temp: 99.2 F (37.3 C) 97.9 F (36.6 C)    TempSrc: Oral Oral    SpO2: 94% 98% 98% 97%  Weight:      Height:       Weight change:   Intake/Output Summary (Last 24 hours) at 06/11/16 1218 Last data filed at 06/11/16 1147  Gross per 24 hour  Intake          1061.25 ml  Output             1500 ml  Net          -438.75 ml    General: Pt sleeping but  easily arousable. She is oriented x3, in no acute distress.  HEENT: [place]/AT PEERL, EOMI, anicteric Neck: Trachea midline,  no masses, no thyromegal,y no JVD, no carotid bruit OROPHARYNX:  Moist, No exudate/ erythema/lesions.  Heart: Regular rate and rhythm, without murmurs, rubs, gallops, PMI non-displaced, no heaves or thrills on palpation.  Lungs: Clear to auscultation, no wheezing or rhonchi noted. No increased vocal fremitus resonant to percussion  Abdomen: Soft, nontender, nondistended, positive bowel sounds, no masses no hepatosplenomegaly noted..  Neuro: No focal neurological deficits noted cranial nerves II through XII grossly intact.  Strength at baseline in bilateral upper and lower extremities. Musculoskeletal: No warmth swelling or erythema around joints, no spinal tenderness noted. Psychiatric: Patient alert and oriented x3, good insight and cognition, good recent to remote recall.    Data Reviewed: Basic Metabolic Panel:  Recent Labs Lab 06/10/16 0208  NA 137  K 3.6  CL 105  CO2 23  GLUCOSE 99  BUN 11  CREATININE 0.66  CALCIUM 9.0   Liver Function Tests:  Recent Labs Lab 06/10/16 0208  AST 25  ALT 19  ALKPHOS 51  BILITOT 2.7*  PROT 6.4*  ALBUMIN 4.0   No results for input(s): LIPASE, AMYLASE in the last 168 hours. No results for input(s): AMMONIA in the  last 168 hours. CBC:  Recent Labs Lab 06/10/16 0208  WBC 11.5*  NEUTROABS 6.5  HGB 8.2*  HCT 23.5*  MCV 87.7  PLT 551*   Cardiac Enzymes: No results for input(s): CKTOTAL, CKMB, CKMBINDEX, TROPONINI in the last 168 hours. BNP (last 3 results) No results for input(s): BNP in the last 8760 hours.  ProBNP (last 3 results) No results for input(s): PROBNP in the last 8760 hours.  CBG: No results for input(s): GLUCAP in the last 168 hours.  No results found for this or any previous visit (from the past 240 hour(s)).   Studies: Dg Chest 1 View  Result Date: 05/19/2016 CLINICAL DATA:  Back pain,  chest pain and shortness of breath. EXAM: CHEST 1 VIEW COMPARISON:  Body CT 02/26/2016 FINDINGS: Injectable right IJ approach central venous catheter terminates in the superior vena cava. The cardiac silhouette is mildly enlarged. Mediastinal contours appear intact. There is no evidence of focal airspace consolidation, pleural effusion or pneumothorax. Osseous structures are without acute abnormality. 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 Soft tissues are grossly normal. IMPRESSION: Mildly enlarged cardiac silhouette, otherwise no evidence of acute disease. Electronically Signed   By: Fidela Salisbury M.D.   On: 05/19/2016 20:02   Dg Chest 2 View  Result Date: 05/29/2016 CLINICAL DATA:  Pt with Hx sickle cell anemia c/o abdominal pain and back pain onset last night, cough onset last night. Reports pain to be due to sickle cell anemia GERD, former smoker x 3 years ago EXAM: CHEST  2 VIEW COMPARISON:  05/19/2016 FINDINGS: Cardiac silhouette is normal in size. No mediastinal or hilar masses. No evidence of adenopathy. Clear lungs.  No pleural effusion.  No pneumothorax. Right anterior chest wall Port-A-Cath is stable with its tip in the lower superior vena cava. 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 IMPRESSION: No active cardiopulmonary disease. Electronically Signed   By: Lajean Manes M.D.   On: 05/29/2016 16:42   Dg Thoracic Spine W/swimmers  Result Date: 06/10/2016 CLINICAL DATA:  Pain after fall. EXAM: THORACIC SPINE - 3 VIEWS COMPARISON:  None. FINDINGS: There is no evidence of thoracic spine fracture. H-shaped thoracic vertebra consistent with history of sickle cell anemia. Alignment is normal. No other significant bone abnormalities are identified. Port catheter tip is seen in the lower superior vena cava. IMPRESSION: No acute osseous abnormality of the thoracic spine. Port catheter tip in the distal SVC. H-shaped  vertebral bodies consistent with sickle-cell. Electronically Signed   By: Ashley Royalty M.D.   On: 06/10/2016 03:43   Dg Lumbar Spine Complete  Result Date: 06/10/2016 CLINICAL DATA:  Patient fell.  Syncope.  Back pain. EXAM: LUMBAR SPINE - COMPLETE 4+ VIEW COMPARISON:  None. FINDINGS: There is no evidence of lumbar spine fracture. Alignment is normal. Intervertebral disc spaces are maintained. H-shaped vertebral bodies throughout the visualized thoracolumbar spine would be in keeping with vertebral body changes related to sickle cell anemia. Cholecystectomy. IMPRESSION: H-shaped vertebral bodies consistent with sickle cell anemia. No acute osseous abnormality. Electronically Signed   By: Ashley Royalty M.D.   On: 06/10/2016 03:39   Ct Head Wo Contrast  Result Date: 06/10/2016 CLINICAL DATA:  Sickle-cell crisis and migraine. Patient fell. Syncope. EXAM: CT HEAD WITHOUT CONTRAST CT CERVICAL SPINE WITHOUT CONTRAST TECHNIQUE: Multidetector CT imaging of the head and cervical spine was performed following the standard protocol without intravenous contrast. Multiplanar CT image reconstructions of the cervical spine were also generated.  COMPARISON:  None. FINDINGS: CT HEAD FINDINGS Brain: No acute intracranial hemorrhage, large vascular territory infarction, edema or midline shift. Ventricles and sulci are within normal limits for age. Parafalcine calcifications are noted anteriorly. Vascular: No large vascular territory infarct. No hyperdense vessels. No unexpected calcifications. Skull: Thickened bony calvarium consistent with history of sickle cell anemia. No acute fracture. Sinuses/Orbits: No acute finding. Other: None. CT CERVICAL SPINE FINDINGS Alignment: Normal Skull base and vertebrae: Thickened bony calvarium consistent with history of sickle cell. H-shaped vertebral bodies noted of the T1 and T2 vertebral bodies consistent with sickle cell. Soft tissues and spinal canal: No prevertebral fluid or swelling. No  visible canal hematoma. Disc levels: No focal disc herniation. No significant neural foraminal encroachment. Upper chest: Negative. Other: None IMPRESSION: Thickened bony calvarium and and H-shaped upper thoracic vertebra consistent with sickle cell anemia. No acute intracranial nor cervical spinal abnormality. Electronically Signed   By: Ashley Royalty M.D.   On: 06/10/2016 03:49   Ct Angio Chest Pe W Or Wo Contrast  Result Date: 06/10/2016 CLINICAL DATA:  26 year old female with syncope. History of sickle cell anemia. EXAM: CT ANGIOGRAPHY CHEST WITH CONTRAST TECHNIQUE: Multidetector CT imaging of the chest was performed using the standard protocol during bolus administration of intravenous contrast. Multiplanar CT image reconstructions and MIPs were obtained to evaluate the vascular anatomy. CONTRAST:  48 cc Isovue 370 COMPARISON:  Chest radiograph dated 05/29/2016 and CT dated 02/26/2016 FINDINGS: Cardiovascular: There is mild cardiomegaly with slight interval increase in the size of the heart since the prior CT. No pericardial effusion. Right pectoral infusion catheter with tip in the right atrium. The thoracic aorta appears unremarkable. The origins of the great vessels of the aortic arch appear patent. Evaluation of the pulmonary arteries is limited due to suboptimal opacification and timing of the contrast. Apparent intraluminal low density involving the segmental branches of the right lower lobe (series 6 image 130) is indeterminate and may be artifactual and related to suboptimal enhancement of the vessel. Pulmonary embolus is not entirely excluded. Correlation with clinical exam recommended. A V/Q scan may provide better evaluation if there is high clinical concern for acute pulmonary embolus. Mediastinum/Nodes: There is no hilar or mediastinal adenopathy. The esophagus is grossly unremarkable. No thyroid nodules identified. Lungs/Pleura: There is a stable 6 mm nodule or 2 adjacent nodules in the superior  segment of the left lower lobe as seen on the prior CT. Right upper lobe linear atelectasis/scarring noted. The lungs are otherwise clear. There is no pleural effusion or pneumothorax. The central airways are patent. Upper Abdomen: Lobulated heterogeneous spleen similar to prior CT likely sequela of sickle cell disease. Musculoskeletal: There is central depression of the thoracic vertebra sequela of sickle cell disease. No acute fracture. Review of the MIP images confirms the above findings. IMPRESSION: This study is essentially nondiagnostic for evaluation of pulmonary embolism due to suboptimal opacification of the pulmonary arteries. Intraluminal low attenuating area involving the segmental branches of the right lower lobe may be artifactual and related to suboptimal opacification of the these branches. Pulmonary embolus is not entirely excluded. Correlation with clinical exam recommended. V/Q scan may provide better evaluation if there is high clinical concern for acute pulmonary embolus. No focal pulmonary consolidation. Stable appearing left lower lobe pulmonary nodule. Mild cardiomegaly with slight interval increase in the cardiac size since the prior CT. Stable appearing heterogeneous and irregular spleen. Electronically Signed   By: Anner Crete M.D.   On: 06/10/2016 07:07   Ct  Cervical Spine Wo Contrast  Result Date: 06/10/2016 CLINICAL DATA:  Sickle-cell crisis and migraine. Patient fell. Syncope. EXAM: CT HEAD WITHOUT CONTRAST CT CERVICAL SPINE WITHOUT CONTRAST TECHNIQUE: Multidetector CT imaging of the head and cervical spine was performed following the standard protocol without intravenous contrast. Multiplanar CT image reconstructions of the cervical spine were also generated. COMPARISON:  None. FINDINGS: CT HEAD FINDINGS Brain: No acute intracranial hemorrhage, large vascular territory infarction, edema or midline shift. Ventricles and sulci are within normal limits for age. Parafalcine  calcifications are noted anteriorly. Vascular: No large vascular territory infarct. No hyperdense vessels. No unexpected calcifications. Skull: Thickened bony calvarium consistent with history of sickle cell anemia. No acute fracture. Sinuses/Orbits: No acute finding. Other: None. CT CERVICAL SPINE FINDINGS Alignment: Normal Skull base and vertebrae: Thickened bony calvarium consistent with history of sickle cell. H-shaped vertebral bodies noted of the T1 and T2 vertebral bodies consistent with sickle cell. Soft tissues and spinal canal: No prevertebral fluid or swelling. No visible canal hematoma. Disc levels: No focal disc herniation. No significant neural foraminal encroachment. Upper chest: Negative. Other: None IMPRESSION: Thickened bony calvarium and and H-shaped upper thoracic vertebra consistent with sickle cell anemia. No acute intracranial nor cervical spinal abnormality. Electronically Signed   By: Ashley Royalty M.D.   On: 06/10/2016 03:49   Nm Pulmonary Perf And Vent  Result Date: 06/10/2016 CLINICAL DATA:  Syncope.  Sickle cell. EXAM: NUCLEAR MEDICINE VENTILATION - PERFUSION LUNG SCAN TECHNIQUE: Ventilation images were obtained in multiple projections using inhaled aerosol Tc-59m DTPA. Perfusion images were obtained in multiple projections after intravenous injection of Tc-52m MAA. RADIOPHARMACEUTICALS:  30.9 mCi Technetium-57m DTPA aerosol inhalation and 4.1 mCi Technetium-72m MAA IV COMPARISON:  Chest x-ray 06/10/2016.  Chest CT 06/10/2016 FINDINGS: No significant focal ventilation-perfusion mismatches noted. This is a low probability scan for pulmonary embolus . IMPRESSION: Low probability pulmonary embolus. Electronically Signed   By: Marcello Moores  Register   On: 06/10/2016 13:34   Dg Chest Port 1 View  Result Date: 06/10/2016 CLINICAL DATA:  Syncope.  History of sickle cell anemia. EXAM: PORTABLE CHEST 1 VIEW COMPARISON:  CT 06/10/2016 . FINDINGS: PowerPort catheter noted with lead tip in the  superior vena cava. Mild cardiomegaly. No evidence of congestive heart failure. No pleural effusion or pneumothorax. No acute bony abnormality. Sclerotic changes of both humeral heads most likely from avascular necrosis. IMPRESSION: 1. PowerPort catheter noted with tip projected over superior vena cava. 2. Borderline cardiomegaly.  No CHF.  No acute pulmonary disease. 3.  Avascular necrosis both humeral heads. Electronically Signed   By: Marcello Moores  Register   On: 06/10/2016 13:32   Dg Shoulder Left  Result Date: 05/22/2016 CLINICAL DATA:  26 year old who fell out of her bed and injured the left shoulder. Initial encounter. Current history of sickle cell disease. EXAM: LEFT SHOULDER - 2+ VIEW COMPARISON:  Left humerus x-rays 04/17/2016. Left shoulder x-rays 05/06/2012, 12/11/2011. FINDINGS: Examination was performed portably, in the axial images therefore less than optimal. No evidence of acute fracture or glenohumeral dislocation. Acromioclavicular joint intact. Osteonecrosis involving the humeral head is again noted, and there is no evidence of osteochondral fracture. IMPRESSION: No acute osseous abnormality. 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 Electronically Signed   By: Evangeline Dakin M.D.   On: 05/22/2016 16:37   Dg Humerus Left  Result Date: 06/10/2016 CLINICAL DATA:  Patient fell.  Sickle cell anemia. EXAM: LEFT HUMERUS - 2+ VIEW COMPARISON:  05/22/2016 left shoulder radiographs FINDINGS: Informed sclerotic appearance of  the left humeral head consistent with osteonecrosis. No loss of the sphericity of the humeral head on current exam. No acute fracture nor bone destruction. IMPRESSION: Mild sclerosis of the humeral head consistent with osteonecrosis. No acute osseous abnormality. Electronically Signed   By: Ashley Royalty M.D.   On: 06/10/2016 03:41    Scheduled Meds: . HYDROmorphone   Intravenous Q4H  . hydroxyurea  1,000 mg Oral Q breakfast  . ketorolac  30 mg Intravenous Q6H  .  morphine  30 mg Oral Q12H  . rivaroxaban  20 mg Oral Q supper  . senna-docusate  1 tablet Oral BID  . sodium chloride flush  10-40 mL Intracatheter Q12H   Continuous Infusions: . dextrose 5 % and 0.45% NaCl 75 mL/hr at 06/11/16 0456    Principal Problem:   Sickle cell pain crisis [facility]) Active Problems:   Syncope     In excess of 25 minutes spent during this visit. Greater than 50% involved face to face contact with the patient for assessment, counseling and coordination of care.

## 2016-06-12 DIAGNOSIS — G43909 Migraine, unspecified, not intractable, without status migrainosus: Secondary | ICD-10-CM | POA: Diagnosis not present

## 2016-06-12 DIAGNOSIS — D638 Anemia in other chronic diseases classified elsewhere: Secondary | ICD-10-CM | POA: Diagnosis not present

## 2016-06-12 DIAGNOSIS — R55 Syncope and collapse: Secondary | ICD-10-CM | POA: Diagnosis not present

## 2016-06-12 DIAGNOSIS — D72829 Elevated white blood cell count, unspecified: Secondary | ICD-10-CM | POA: Diagnosis not present

## 2016-06-12 DIAGNOSIS — D5700 Hb-SS disease with crisis, unspecified: Secondary | ICD-10-CM | POA: Diagnosis not present

## 2016-06-12 DIAGNOSIS — R400 Somnolence: Secondary | ICD-10-CM | POA: Diagnosis not present

## 2016-06-12 LAB — CBC WITH DIFFERENTIAL/PLATELET
BASOS ABS: 0.1 10*3/uL (ref 0.0–0.1)
Basophils Relative: 1 %
EOS PCT: 9 %
Eosinophils Absolute: 1.1 10*3/uL — ABNORMAL HIGH (ref 0.0–0.7)
HCT: 21.6 % — ABNORMAL LOW (ref 36.0–46.0)
Hemoglobin: 7.3 g/dL — ABNORMAL LOW (ref 12.0–15.0)
LYMPHS ABS: 2.2 10*3/uL (ref 0.7–4.0)
LYMPHS PCT: 18 %
MCH: 29.3 pg (ref 26.0–34.0)
MCHC: 33.8 g/dL (ref 30.0–36.0)
MCV: 86.7 fL (ref 78.0–100.0)
MONO ABS: 1.2 10*3/uL — AB (ref 0.1–1.0)
Monocytes Relative: 10 %
Neutro Abs: 7.5 10*3/uL (ref 1.7–7.7)
Neutrophils Relative %: 62 %
PLATELETS: 561 10*3/uL — AB (ref 150–400)
RBC: 2.49 MIL/uL — ABNORMAL LOW (ref 3.87–5.11)
RDW: 18 % — AB (ref 11.5–15.5)
WBC: 12.1 10*3/uL — ABNORMAL HIGH (ref 4.0–10.5)

## 2016-06-12 LAB — RETICULOCYTES
RBC.: 2.49 MIL/uL — ABNORMAL LOW (ref 3.87–5.11)
Retic Count, Absolute: 395.9 10*3/uL — ABNORMAL HIGH (ref 19.0–186.0)
Retic Ct Pct: 15.9 % — ABNORMAL HIGH (ref 0.4–3.1)

## 2016-06-12 MED ORDER — HYDROMORPHONE HCL 2 MG/ML IJ SOLN
2.0000 mg | INTRAMUSCULAR | Status: DC
  Administered 2016-06-12 – 2016-06-14 (×15): 2 mg via INTRAVENOUS
  Filled 2016-06-12 (×15): qty 1

## 2016-06-12 NOTE — Progress Notes (Signed)
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 Syncope: So far all studies negative. Will obtain MRA to evaluate cerebral vasculature. Continue Telemetry monitoring as patietn still c/o dizziness even at rest. 2. Hb SS with crisis: Pt feels that her pain of sickle cell increased after she fell during syncopal episode. Continue PCA bolus dose to 0.6 mg and add scheduled clinician assisted doses of Dilaudid.Continue Toradol, MS Contin and decrease IVF to [facility].  Re-evaluate pain regimen tomorrow.  3. Leukocytosis: Pt has mild elevation in WBC but no evidence of infection.  4. Anemia of chronic disease. Hb stable. Continue Hydrea. 5. Chronic Anticoagulation: Pt on Xarelto chronically for DVT. Continue.  6. Chronic pain: Continue MS Contin   Code Status: Full Code Family Communication: N/A Disposition Plan: Not yet ready for discharge  [place].  Pager [phone]. If 7PM-7AM, please contact night-coverage.  06/12/2016, 4:01 PM  LOS: 2 days   Interim History: Pt reports pain in LUE characteristic of sickle cell crisis at an intensity of 8/10. However she also has some pain in the upper back which she reports came on after she fell. She has used 34.8mg  with 67/59: demands/deliveries in the last 24 hours.  Consultants:  None  Procedures:  None  Antibiotics:  None   Objective: Vitals:   06/12/16 0510 06/12/16 0800 06/12/16 1200 06/12/16 1421  BP:    111/70  Pulse:    94  Resp: 15 10 11 12   Temp:    97.5 F (36.4 C)  TempSrc:    Oral  SpO2: 99% 97% 97% 99%  Weight:      Height:       Weight change:   Intake/Output Summary (Last 24 hours) at 06/12/16 1601 Last data filed at 06/12/16 1229  Gross per 24 hour  Intake           3110.2 ml  Output             4500 ml  Net           -1389.8 ml    General: Pt sleeping but easily arousable. She is oriented x3, in mild distress due to pain.  HEENT: [place]/AT PEERL, EOMI, anicteric  Heart: Regular rate and rhythm, without murmurs, rubs, gallops, PMI non-displaced, no heaves or thrills on palpation.  Lungs: Clear to auscultation, no wheezing or rhonchi noted. No increased vocal fremitus resonant to percussion  Abdomen: Soft, nontender, nondistended, positive bowel sounds, no masses no hepatosplenomegaly noted.  Neuro: No focal neurological deficits noted cranial nerves II through XII grossly intact.  Strength at baseline in bilateral upper and lower extremities. Musculoskeletal: No warmth swelling or erythema around joints, no spinal tenderness noted. Psychiatric: Patient alert and oriented x3, good insight and cognition, good recent to remote recall.    Data Reviewed: Basic Metabolic Panel:  Recent Labs Lab 06/10/16 0208 06/11/16 1259  NA 137 138  K 3.6 4.1  CL 105 110  CO2 23 25  GLUCOSE 99 101*  BUN 11 9  CREATININE 0.66 0.53  CALCIUM 9.0 8.5*   Liver Function Tests:  Recent Labs Lab 06/10/16 0208  AST 25  ALT 19  ALKPHOS 51  BILITOT 2.7*  PROT 6.4*  ALBUMIN 4.0   No results for input(s): LIPASE, AMYLASE in the last 168 hours. No results for input(s): AMMONIA in the  last 168 hours. CBC:  Recent Labs Lab 06/10/16 0208 06/12/16 0507  WBC 11.5* 12.1*  NEUTROABS 6.5 7.5  HGB 8.2* 7.3*  HCT 23.5* 21.6*  MCV 87.7 86.7  PLT 551* 561*   Cardiac Enzymes: No results for input(s): CKTOTAL, CKMB, CKMBINDEX, TROPONINI in the last 168 hours. BNP (last 3 results) No results for input(s): BNP in the last 8760 hours.  ProBNP (last 3 results) No results for input(s): PROBNP in the last 8760 hours.  CBG: No results for input(s): GLUCAP in the last 168 hours.  No results found for this or any previous visit (from the past 240 hour(s)).   Studies: Dg Chest 1 View  Result Date: 05/19/2016 CLINICAL  DATA:  Back pain, chest pain and shortness of breath. EXAM: CHEST 1 VIEW COMPARISON:  Body CT 02/26/2016 FINDINGS: Injectable right IJ approach central venous catheter terminates in the superior vena cava. The cardiac silhouette is mildly enlarged. Mediastinal contours appear intact. There is no evidence of focal airspace consolidation, pleural effusion or pneumothorax. Osseous structures are without acute abnormality. 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 Soft tissues are grossly normal. IMPRESSION: Mildly enlarged cardiac silhouette, otherwise no evidence of acute disease. Electronically Signed   By: Fidela Salisbury M.D.   On: 05/19/2016 20:02   Dg Chest 2 View  Result Date: 05/29/2016 CLINICAL DATA:  Pt with Hx sickle cell anemia c/o abdominal pain and back pain onset last night, cough onset last night. Reports pain to be due to sickle cell anemia GERD, former smoker x 3 years ago EXAM: CHEST  2 VIEW COMPARISON:  05/19/2016 FINDINGS: Cardiac silhouette is normal in size. No mediastinal or hilar masses. No evidence of adenopathy. Clear lungs.  No pleural effusion.  No pneumothorax. Right anterior chest wall Port-A-Cath is stable with its tip in the lower superior vena cava. 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 IMPRESSION: No active cardiopulmonary disease. Electronically Signed   By: Lajean Manes M.D.   On: 05/29/2016 16:42   Dg Thoracic Spine W/swimmers  Result Date: 06/10/2016 CLINICAL DATA:  Pain after fall. EXAM: THORACIC SPINE - 3 VIEWS COMPARISON:  None. FINDINGS: There is no evidence of thoracic spine fracture. H-shaped thoracic vertebra consistent with history of sickle cell anemia. Alignment is normal. No other significant bone abnormalities are identified. Port catheter tip is seen in the lower superior vena cava. IMPRESSION: No acute osseous abnormality of the thoracic spine. Port catheter tip in the distal  SVC. H-shaped vertebral bodies consistent with sickle-cell. Electronically Signed   By: Ashley Royalty M.D.   On: 06/10/2016 03:43   Dg Lumbar Spine Complete  Result Date: 06/10/2016 CLINICAL DATA:  Patient fell.  Syncope.  Back pain. EXAM: LUMBAR SPINE - COMPLETE 4+ VIEW COMPARISON:  None. FINDINGS: There is no evidence of lumbar spine fracture. Alignment is normal. Intervertebral disc spaces are maintained. H-shaped vertebral bodies throughout the visualized thoracolumbar spine would be in keeping with vertebral body changes related to sickle cell anemia. Cholecystectomy. IMPRESSION: H-shaped vertebral bodies consistent with sickle cell anemia. No acute osseous abnormality. Electronically Signed   By: Ashley Royalty M.D.   On: 06/10/2016 03:39   Ct Head Wo Contrast  Result Date: 06/10/2016 CLINICAL DATA:  Sickle-cell crisis and migraine. Patient fell. Syncope. EXAM: CT HEAD WITHOUT CONTRAST CT CERVICAL SPINE WITHOUT CONTRAST TECHNIQUE: Multidetector CT imaging of the head and cervical spine was performed following the standard protocol without intravenous contrast. Multiplanar CT image  reconstructions of the cervical spine were also generated. COMPARISON:  None. FINDINGS: CT HEAD FINDINGS Brain: No acute intracranial hemorrhage, large vascular territory infarction, edema or midline shift. Ventricles and sulci are within normal limits for age. Parafalcine calcifications are noted anteriorly. Vascular: No large vascular territory infarct. No hyperdense vessels. No unexpected calcifications. Skull: Thickened bony calvarium consistent with history of sickle cell anemia. No acute fracture. Sinuses/Orbits: No acute finding. Other: None. CT CERVICAL SPINE FINDINGS Alignment: Normal Skull base and vertebrae: Thickened bony calvarium consistent with history of sickle cell. H-shaped vertebral bodies noted of the T1 and T2 vertebral bodies consistent with sickle cell. Soft tissues and spinal canal: No prevertebral fluid or  swelling. No visible canal hematoma. Disc levels: No focal disc herniation. No significant neural foraminal encroachment. Upper chest: Negative. Other: None IMPRESSION: Thickened bony calvarium and and H-shaped upper thoracic vertebra consistent with sickle cell anemia. No acute intracranial nor cervical spinal abnormality. Electronically Signed   By: Ashley Royalty M.D.   On: 06/10/2016 03:49   Ct Angio Chest Pe W Or Wo Contrast  Result Date: 06/10/2016 CLINICAL DATA:  26 year old female with syncope. History of sickle cell anemia. EXAM: CT ANGIOGRAPHY CHEST WITH CONTRAST TECHNIQUE: Multidetector CT imaging of the chest was performed using the standard protocol during bolus administration of intravenous contrast. Multiplanar CT image reconstructions and MIPs were obtained to evaluate the vascular anatomy. CONTRAST:  48 cc Isovue 370 COMPARISON:  Chest radiograph dated 05/29/2016 and CT dated 02/26/2016 FINDINGS: Cardiovascular: There is mild cardiomegaly with slight interval increase in the size of the heart since the prior CT. No pericardial effusion. Right pectoral infusion catheter with tip in the right atrium. The thoracic aorta appears unremarkable. The origins of the great vessels of the aortic arch appear patent. Evaluation of the pulmonary arteries is limited due to suboptimal opacification and timing of the contrast. Apparent intraluminal low density involving the segmental branches of the right lower lobe (series 6 image 130) is indeterminate and may be artifactual and related to suboptimal enhancement of the vessel. Pulmonary embolus is not entirely excluded. Correlation with clinical exam recommended. A V/Q scan may provide better evaluation if there is high clinical concern for acute pulmonary embolus. Mediastinum/Nodes: There is no hilar or mediastinal adenopathy. The esophagus is grossly unremarkable. No thyroid nodules identified. Lungs/Pleura: There is a stable 6 mm nodule or 2 adjacent nodules in  the superior segment of the left lower lobe as seen on the prior CT. Right upper lobe linear atelectasis/scarring noted. The lungs are otherwise clear. There is no pleural effusion or pneumothorax. The central airways are patent. Upper Abdomen: Lobulated heterogeneous spleen similar to prior CT likely sequela of sickle cell disease. Musculoskeletal: There is central depression of the thoracic vertebra sequela of sickle cell disease. No acute fracture. Review of the MIP images confirms the above findings. IMPRESSION: This study is essentially nondiagnostic for evaluation of pulmonary embolism due to suboptimal opacification of the pulmonary arteries. Intraluminal low attenuating area involving the segmental branches of the right lower lobe may be artifactual and related to suboptimal opacification of the these branches. Pulmonary embolus is not entirely excluded. Correlation with clinical exam recommended. V/Q scan may provide better evaluation if there is high clinical concern for acute pulmonary embolus. No focal pulmonary consolidation. Stable appearing left lower lobe pulmonary nodule. Mild cardiomegaly with slight interval increase in the cardiac size since the prior CT. Stable appearing heterogeneous and irregular spleen. Electronically Signed   By: Laren Everts.D.  On: 06/10/2016 07:07   Ct Cervical Spine Wo Contrast  Result Date: 06/10/2016 CLINICAL DATA:  Sickle-cell crisis and migraine. Patient fell. Syncope. EXAM: CT HEAD WITHOUT CONTRAST CT CERVICAL SPINE WITHOUT CONTRAST TECHNIQUE: Multidetector CT imaging of the head and cervical spine was performed following the standard protocol without intravenous contrast. Multiplanar CT image reconstructions of the cervical spine were also generated. COMPARISON:  None. FINDINGS: CT HEAD FINDINGS Brain: No acute intracranial hemorrhage, large vascular territory infarction, edema or midline shift. Ventricles and sulci are within normal limits for age.  Parafalcine calcifications are noted anteriorly. Vascular: No large vascular territory infarct. No hyperdense vessels. No unexpected calcifications. Skull: Thickened bony calvarium consistent with history of sickle cell anemia. No acute fracture. Sinuses/Orbits: No acute finding. Other: None. CT CERVICAL SPINE FINDINGS Alignment: Normal Skull base and vertebrae: Thickened bony calvarium consistent with history of sickle cell. H-shaped vertebral bodies noted of the T1 and T2 vertebral bodies consistent with sickle cell. Soft tissues and spinal canal: No prevertebral fluid or swelling. No visible canal hematoma. Disc levels: No focal disc herniation. No significant neural foraminal encroachment. Upper chest: Negative. Other: None IMPRESSION: Thickened bony calvarium and and H-shaped upper thoracic vertebra consistent with sickle cell anemia. No acute intracranial nor cervical spinal abnormality. Electronically Signed   By: Ashley Royalty M.D.   On: 06/10/2016 03:49   Nm Pulmonary Perf And Vent  Result Date: 06/10/2016 CLINICAL DATA:  Syncope.  Sickle cell. EXAM: NUCLEAR MEDICINE VENTILATION - PERFUSION LUNG SCAN TECHNIQUE: Ventilation images were obtained in multiple projections using inhaled aerosol Tc-75m DTPA. Perfusion images were obtained in multiple projections after intravenous injection of Tc-86m MAA. RADIOPHARMACEUTICALS:  30.9 mCi Technetium-30m DTPA aerosol inhalation and 4.1 mCi Technetium-79m MAA IV COMPARISON:  Chest x-ray 06/10/2016.  Chest CT 06/10/2016 FINDINGS: No significant focal ventilation-perfusion mismatches noted. This is a low probability scan for pulmonary embolus . IMPRESSION: Low probability pulmonary embolus. Electronically Signed   By: Marcello Moores  Register   On: 06/10/2016 13:34   Dg Chest Port 1 View  Result Date: 06/10/2016 CLINICAL DATA:  Syncope.  History of sickle cell anemia. EXAM: PORTABLE CHEST 1 VIEW COMPARISON:  CT 06/10/2016 . FINDINGS: PowerPort catheter noted with lead tip  in the superior vena cava. Mild cardiomegaly. No evidence of congestive heart failure. No pleural effusion or pneumothorax. No acute bony abnormality. Sclerotic changes of both humeral heads most likely from avascular necrosis. IMPRESSION: 1. PowerPort catheter noted with tip projected over superior vena cava. 2. Borderline cardiomegaly.  No CHF.  No acute pulmonary disease. 3.  Avascular necrosis both humeral heads. Electronically Signed   By: Marcello Moores  Register   On: 06/10/2016 13:32   Dg Shoulder Left  Result Date: 05/22/2016 CLINICAL DATA:  26 year old who fell out of her bed and injured the left shoulder. Initial encounter. Current history of sickle cell disease. EXAM: LEFT SHOULDER - 2+ VIEW COMPARISON:  Left humerus x-rays 04/17/2016. Left shoulder x-rays 05/06/2012, 12/11/2011. FINDINGS: Examination was performed portably, in the axial images therefore less than optimal. No evidence of acute fracture or glenohumeral dislocation. Acromioclavicular joint intact. Osteonecrosis involving the humeral head is again noted, and there is no evidence of osteochondral fracture. IMPRESSION: No acute osseous abnormality. 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 Electronically Signed   By: Evangeline Dakin M.D.   On: 05/22/2016 16:37   Dg Humerus Left  Result Date: 06/10/2016 CLINICAL DATA:  Patient fell.  Sickle cell anemia. EXAM: LEFT HUMERUS - 2+ VIEW COMPARISON:  05/22/2016 left shoulder  radiographs FINDINGS: Informed sclerotic appearance of the left humeral head consistent with osteonecrosis. No loss of the sphericity of the humeral head on current exam. No acute fracture nor bone destruction. IMPRESSION: Mild sclerosis of the humeral head consistent with osteonecrosis. No acute osseous abnormality. Electronically Signed   By: Ashley Royalty M.D.   On: 06/10/2016 03:41    Scheduled Meds: . HYDROmorphone   Intravenous Q4H  .  HYDROmorphone (DILAUDID) injection  2 mg Intravenous Q3H  . hydroxyurea   1,000 mg Oral Q breakfast  . ketorolac  30 mg Intravenous Q6H  . morphine  30 mg Oral Q12H  . rivaroxaban  20 mg Oral Q supper  . senna-docusate  1 tablet Oral BID  . sodium chloride flush  10-40 mL Intracatheter Q12H   Continuous Infusions: . 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 Greater than 50% involved face to face contact with the patient for assessment, counseling and coordination of care.

## 2016-06-13 ENCOUNTER — Telehealth (HOSPITAL_COMMUNITY): Payer: Self-pay | Admitting: Hematology

## 2016-06-13 ENCOUNTER — Inpatient Hospital Stay (HOSPITAL_COMMUNITY): Payer: Medicare Other

## 2016-06-13 DIAGNOSIS — D5700 Hb-SS disease with crisis, unspecified: Secondary | ICD-10-CM | POA: Diagnosis not present

## 2016-06-13 DIAGNOSIS — R51 Headache: Secondary | ICD-10-CM | POA: Diagnosis not present

## 2016-06-13 DIAGNOSIS — R55 Syncope and collapse: Secondary | ICD-10-CM | POA: Diagnosis not present

## 2016-06-13 DIAGNOSIS — D72829 Elevated white blood cell count, unspecified: Secondary | ICD-10-CM | POA: Diagnosis not present

## 2016-06-13 DIAGNOSIS — R400 Somnolence: Secondary | ICD-10-CM | POA: Diagnosis not present

## 2016-06-13 DIAGNOSIS — F1190 Opioid use, unspecified, uncomplicated: Secondary | ICD-10-CM | POA: Diagnosis not present

## 2016-06-13 DIAGNOSIS — G43909 Migraine, unspecified, not intractable, without status migrainosus: Secondary | ICD-10-CM | POA: Diagnosis not present

## 2016-06-13 DIAGNOSIS — D638 Anemia in other chronic diseases classified elsewhere: Secondary | ICD-10-CM | POA: Diagnosis not present

## 2016-06-13 MED ORDER — LORAZEPAM 2 MG/ML IJ SOLN
1.0000 mg | Freq: Once | INTRAMUSCULAR | Status: AC
  Administered 2016-06-13: 1 mg via INTRAVENOUS
  Filled 2016-06-13: qty 1

## 2016-06-13 NOTE — Progress Notes (Signed)
Patient reported during MRI that she is claustrophobic. Paged MD on call. Received orders for one time dose of Ativan 1 mg IV.

## 2016-06-13 NOTE — Progress Notes (Signed)
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 Syncope: So far all studies negative.  MRA scheduled for today. Will discontinue telemetry if MRA non-concerning.  2. Hb SS with crisis: Continue PCA and Toradol at current dose. Continue scheduled clinician assisted doses of Dilaudid and re-evaluate pain for weaning of opiates and transition to Oxycodone.  Continue MS Contin for Chronic pain. 3. Leukocytosis: Pt has mild elevation in WBC but no evidence of infection.  4. Anemia of chronic disease. Hb stable. Continue Hydrea. 5. Chronic Anticoagulation: Pt on Xarelto chronically for DVT. Continue Xarelto.  6. Chronic pain: Continue MS Contin   Code Status: Full Code Family Communication: N/A Disposition Plan: Not yet ready for discharge  [place].  Pager [phone]. If 7PM-7AM, please contact night-coverage.  06/13/2016, 4:06 PM  LOS: 3 days   Interim History: Pt reports pain still in LUE characteristic of sickle cell crisis at an intensity of 7/10.  She has used 28.77 mg with 52/48: demands/deliveries on the PCA in addition to 16 mg of IV Dilaudid in the last 24 hours.  Consultants:  None  Procedures:  None  Antibiotics:  None   Objective: Vitals:   06/13/16 0400 06/13/16 0800 06/13/16 1200 06/13/16 1553  BP:  118/74 133/71   Pulse:  93 99   Resp: 13 13 15 17   Temp:  075-GRM F (36.9 C) 99.3 F (37.4 C)   TempSrc:  Oral Oral   SpO2: 94% 99% 94% 93%  Weight:      Height:       Weight change:   Intake/Output Summary (Last 24 hours) at 06/13/16 1606 Last data filed at 06/13/16 1223  Gross per 24 hour  Intake              600 ml  Output             1650 ml  Net            -1050 ml    General: Pt A&O x 3 eating lunch.  She is oriented x3, in mild distress due to pain.  HEENT:  [place]/AT PEERL, EOMI, anicteric  Heart: Regular rate and rhythm, without murmurs, rubs, gallops, PMI non-displaced, no heaves or thrills on palpation.  Lungs: Clear to auscultation, no wheezing or rhonchi noted. No increased vocal fremitus resonant to percussion  Abdomen: Soft, nontender, nondistended, positive bowel sounds, no masses no hepatosplenomegaly noted.  Neuro: No focal neurological deficits noted cranial nerves II through XII grossly intact.  Strength at baseline in bilateral upper and lower extremities. Musculoskeletal: No warmth swelling or erythema around joints, no spinal tenderness noted. Psychiatric: Patient alert and oriented x3, good insight and cognition, good recent to remote recall.    Data Reviewed: Basic Metabolic Panel:  Recent Labs Lab 06/10/16 0208 06/11/16 1259  NA 137 138  K 3.6 4.1  CL 105 110  CO2 23 25  GLUCOSE 99 101*  BUN 11 9  CREATININE 0.66 0.53  CALCIUM 9.0 8.5*   Liver Function Tests:  Recent Labs Lab 06/10/16 0208  AST 25  ALT 19  ALKPHOS 51  BILITOT 2.7*  PROT 6.4*  ALBUMIN 4.0   No results for input(s): LIPASE, AMYLASE in the last 168 hours. No results for input(s): AMMONIA in the last 168 hours. CBC:  Recent Labs Lab 06/10/16 0208 06/12/16  0507  WBC 11.5* 12.1*  NEUTROABS 6.5 7.5  HGB 8.2* 7.3*  HCT 23.5* 21.6*  MCV 87.7 86.7  PLT 551* 561*   Cardiac Enzymes: No results for input(s): CKTOTAL, CKMB, CKMBINDEX, TROPONINI in the last 168 hours. BNP (last 3 results) No results for input(s): BNP in the last 8760 hours.  ProBNP (last 3 results) No results for input(s): PROBNP in the last 8760 hours.  CBG: No results for input(s): GLUCAP in the last 168 hours.  No results found for this or any previous visit (from the past 240 hour(s)).   Studies: Dg Chest 1 View  Result Date: 05/19/2016 CLINICAL DATA:  Back pain, chest pain and shortness of breath. EXAM: CHEST 1 VIEW COMPARISON:  Body CT 02/26/2016 FINDINGS:  Injectable right IJ approach central venous catheter terminates in the superior vena cava. The cardiac silhouette is mildly enlarged. Mediastinal contours appear intact. There is no evidence of focal airspace consolidation, pleural effusion or pneumothorax. Osseous structures are without acute abnormality. 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 Soft tissues are grossly normal. IMPRESSION: Mildly enlarged cardiac silhouette, otherwise no evidence of acute disease. Electronically Signed   By: Fidela Salisbury M.D.   On: 05/19/2016 20:02   Dg Chest 2 View  Result Date: 05/29/2016 CLINICAL DATA:  Pt with Hx sickle cell anemia c/o abdominal pain and back pain onset last night, cough onset last night. Reports pain to be due to sickle cell anemia GERD, former smoker x 3 years ago EXAM: CHEST  2 VIEW COMPARISON:  05/19/2016 FINDINGS: Cardiac silhouette is normal in size. No mediastinal or hilar masses. No evidence of adenopathy. Clear lungs.  No pleural effusion.  No pneumothorax. Right anterior chest wall Port-A-Cath is stable with its tip in the lower superior vena cava. 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 IMPRESSION: No active cardiopulmonary disease. Electronically Signed   By: Lajean Manes M.D.   On: 05/29/2016 16:42   Dg Thoracic Spine W/swimmers  Result Date: 06/10/2016 CLINICAL DATA:  Pain after fall. EXAM: THORACIC SPINE - 3 VIEWS COMPARISON:  None. FINDINGS: There is no evidence of thoracic spine fracture. H-shaped thoracic vertebra consistent with history of sickle cell anemia. Alignment is normal. No other significant bone abnormalities are identified. Port catheter tip is seen in the lower superior vena cava. IMPRESSION: No acute osseous abnormality of the thoracic spine. Port catheter tip in the distal SVC. H-shaped vertebral bodies consistent with sickle-cell. Electronically Signed   By: Ashley Royalty M.D.   On:  06/10/2016 03:43   Dg Lumbar Spine Complete  Result Date: 06/10/2016 CLINICAL DATA:  Patient fell.  Syncope.  Back pain. EXAM: LUMBAR SPINE - COMPLETE 4+ VIEW COMPARISON:  None. FINDINGS: There is no evidence of lumbar spine fracture. Alignment is normal. Intervertebral disc spaces are maintained. H-shaped vertebral bodies throughout the visualized thoracolumbar spine would be in keeping with vertebral body changes related to sickle cell anemia. Cholecystectomy. IMPRESSION: H-shaped vertebral bodies consistent with sickle cell anemia. No acute osseous abnormality. Electronically Signed   By: Ashley Royalty M.D.   On: 06/10/2016 03:39   Ct Head Wo Contrast  Result Date: 06/10/2016 CLINICAL DATA:  Sickle-cell crisis and migraine. Patient fell. Syncope. EXAM: CT HEAD WITHOUT CONTRAST CT CERVICAL SPINE WITHOUT CONTRAST TECHNIQUE: Multidetector CT imaging of the head and cervical spine was performed following the standard protocol without intravenous contrast. Multiplanar CT image reconstructions of the cervical spine were also generated. COMPARISON:  None.  FINDINGS: CT HEAD FINDINGS Brain: No acute intracranial hemorrhage, large vascular territory infarction, edema or midline shift. Ventricles and sulci are within normal limits for age. Parafalcine calcifications are noted anteriorly. Vascular: No large vascular territory infarct. No hyperdense vessels. No unexpected calcifications. Skull: Thickened bony calvarium consistent with history of sickle cell anemia. No acute fracture. Sinuses/Orbits: No acute finding. Other: None. CT CERVICAL SPINE FINDINGS Alignment: Normal Skull base and vertebrae: Thickened bony calvarium consistent with history of sickle cell. H-shaped vertebral bodies noted of the T1 and T2 vertebral bodies consistent with sickle cell. Soft tissues and spinal canal: No prevertebral fluid or swelling. No visible canal hematoma. Disc levels: No focal disc herniation. No significant neural foraminal  encroachment. Upper chest: Negative. Other: None IMPRESSION: Thickened bony calvarium and and H-shaped upper thoracic vertebra consistent with sickle cell anemia. No acute intracranial nor cervical spinal abnormality. Electronically Signed   By: Ashley Royalty M.D.   On: 06/10/2016 03:49   Ct Angio Chest Pe W Or Wo Contrast  Result Date: 06/10/2016 CLINICAL DATA:  26 year old female with syncope. History of sickle cell anemia. EXAM: CT ANGIOGRAPHY CHEST WITH CONTRAST TECHNIQUE: Multidetector CT imaging of the chest was performed using the standard protocol during bolus administration of intravenous contrast. Multiplanar CT image reconstructions and MIPs were obtained to evaluate the vascular anatomy. CONTRAST:  48 cc Isovue 370 COMPARISON:  Chest radiograph dated 05/29/2016 and CT dated 02/26/2016 FINDINGS: Cardiovascular: There is mild cardiomegaly with slight interval increase in the size of the heart since the prior CT. No pericardial effusion. Right pectoral infusion catheter with tip in the right atrium. The thoracic aorta appears unremarkable. The origins of the great vessels of the aortic arch appear patent. Evaluation of the pulmonary arteries is limited due to suboptimal opacification and timing of the contrast. Apparent intraluminal low density involving the segmental branches of the right lower lobe (series 6 image 130) is indeterminate and may be artifactual and related to suboptimal enhancement of the vessel. Pulmonary embolus is not entirely excluded. Correlation with clinical exam recommended. A V/Q scan may provide better evaluation if there is high clinical concern for acute pulmonary embolus. Mediastinum/Nodes: There is no hilar or mediastinal adenopathy. The esophagus is grossly unremarkable. No thyroid nodules identified. Lungs/Pleura: There is a stable 6 mm nodule or 2 adjacent nodules in the superior segment of the left lower lobe as seen on the prior CT. Right upper lobe linear  atelectasis/scarring noted. The lungs are otherwise clear. There is no pleural effusion or pneumothorax. The central airways are patent. Upper Abdomen: Lobulated heterogeneous spleen similar to prior CT likely sequela of sickle cell disease. Musculoskeletal: There is central depression of the thoracic vertebra sequela of sickle cell disease. No acute fracture. Review of the MIP images confirms the above findings. IMPRESSION: This study is essentially nondiagnostic for evaluation of pulmonary embolism due to suboptimal opacification of the pulmonary arteries. Intraluminal low attenuating area involving the segmental branches of the right lower lobe may be artifactual and related to suboptimal opacification of the these branches. Pulmonary embolus is not entirely excluded. Correlation with clinical exam recommended. V/Q scan may provide better evaluation if there is high clinical concern for acute pulmonary embolus. No focal pulmonary consolidation. Stable appearing left lower lobe pulmonary nodule. Mild cardiomegaly with slight interval increase in the cardiac size since the prior CT. Stable appearing heterogeneous and irregular spleen. Electronically Signed   By: Anner Crete M.D.   On: 06/10/2016 07:07   Ct Cervical Spine Wo  Contrast  Result Date: 06/10/2016 CLINICAL DATA:  Sickle-cell crisis and migraine. Patient fell. Syncope. EXAM: CT HEAD WITHOUT CONTRAST CT CERVICAL SPINE WITHOUT CONTRAST TECHNIQUE: Multidetector CT imaging of the head and cervical spine was performed following the standard protocol without intravenous contrast. Multiplanar CT image reconstructions of the cervical spine were also generated. COMPARISON:  None. FINDINGS: CT HEAD FINDINGS Brain: No acute intracranial hemorrhage, large vascular territory infarction, edema or midline shift. Ventricles and sulci are within normal limits for age. Parafalcine calcifications are noted anteriorly. Vascular: No large vascular territory infarct.  No hyperdense vessels. No unexpected calcifications. Skull: Thickened bony calvarium consistent with history of sickle cell anemia. No acute fracture. Sinuses/Orbits: No acute finding. Other: None. CT CERVICAL SPINE FINDINGS Alignment: Normal Skull base and vertebrae: Thickened bony calvarium consistent with history of sickle cell. H-shaped vertebral bodies noted of the T1 and T2 vertebral bodies consistent with sickle cell. Soft tissues and spinal canal: No prevertebral fluid or swelling. No visible canal hematoma. Disc levels: No focal disc herniation. No significant neural foraminal encroachment. Upper chest: Negative. Other: None IMPRESSION: Thickened bony calvarium and and H-shaped upper thoracic vertebra consistent with sickle cell anemia. No acute intracranial nor cervical spinal abnormality. Electronically Signed   By: Ashley Royalty M.D.   On: 06/10/2016 03:49   Nm Pulmonary Perf And Vent  Result Date: 06/10/2016 CLINICAL DATA:  Syncope.  Sickle cell. EXAM: NUCLEAR MEDICINE VENTILATION - PERFUSION LUNG SCAN TECHNIQUE: Ventilation images were obtained in multiple projections using inhaled aerosol Tc-61m DTPA. Perfusion images were obtained in multiple projections after intravenous injection of Tc-75m MAA. RADIOPHARMACEUTICALS:  30.9 mCi Technetium-66m DTPA aerosol inhalation and 4.1 mCi Technetium-75m MAA IV COMPARISON:  Chest x-ray 06/10/2016.  Chest CT 06/10/2016 FINDINGS: No significant focal ventilation-perfusion mismatches noted. This is a low probability scan for pulmonary embolus . IMPRESSION: Low probability pulmonary embolus. Electronically Signed   By: Marcello Moores  Register   On: 06/10/2016 13:34   Dg Chest Port 1 View  Result Date: 06/10/2016 CLINICAL DATA:  Syncope.  History of sickle cell anemia. EXAM: PORTABLE CHEST 1 VIEW COMPARISON:  CT 06/10/2016 . FINDINGS: PowerPort catheter noted with lead tip in the superior vena cava. Mild cardiomegaly. No evidence of congestive heart failure. No  pleural effusion or pneumothorax. No acute bony abnormality. Sclerotic changes of both humeral heads most likely from avascular necrosis. IMPRESSION: 1. PowerPort catheter noted with tip projected over superior vena cava. 2. Borderline cardiomegaly.  No CHF.  No acute pulmonary disease. 3.  Avascular necrosis both humeral heads. Electronically Signed   By: Marcello Moores  Register   On: 06/10/2016 13:32   Dg Shoulder Left  Result Date: 05/22/2016 CLINICAL DATA:  26 year old who fell out of her bed and injured the left shoulder. Initial encounter. Current history of sickle cell disease. EXAM: LEFT SHOULDER - 2+ VIEW COMPARISON:  Left humerus x-rays 04/17/2016. Left shoulder x-rays 05/06/2012, 12/11/2011. FINDINGS: Examination was performed portably, in the axial images therefore less than optimal. No evidence of acute fracture or glenohumeral dislocation. Acromioclavicular joint intact. Osteonecrosis involving the humeral head is again noted, and there is no evidence of osteochondral fracture. IMPRESSION: No acute osseous abnormality. 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 Electronically Signed   By: Evangeline Dakin M.D.   On: 05/22/2016 16:37   Dg Humerus Left  Result Date: 06/10/2016 CLINICAL DATA:  Patient fell.  Sickle cell anemia. EXAM: LEFT HUMERUS - 2+ VIEW COMPARISON:  05/22/2016 left shoulder radiographs FINDINGS: Informed sclerotic appearance of the left humeral  head consistent with osteonecrosis. No loss of the sphericity of the humeral head on current exam. No acute fracture nor bone destruction. IMPRESSION: Mild sclerosis of the humeral head consistent with osteonecrosis. No acute osseous abnormality. Electronically Signed   By: Ashley Royalty M.D.   On: 06/10/2016 03:41    Scheduled Meds: . HYDROmorphone   Intravenous Q4H  .  HYDROmorphone (DILAUDID) injection  2 mg Intravenous Q3H  . hydroxyurea  1,000 mg Oral Q breakfast  . ketorolac  30 mg Intravenous Q6H  . morphine  30 mg Oral  Q12H  . rivaroxaban  20 mg Oral Q supper  . senna-docusate  1 tablet Oral BID  . sodium chloride flush  10-40 mL Intracatheter Q12H   Continuous Infusions: . dextrose 5 % and 0.45% NaCl 75 mL/hr at 06/13/16 1601    Principal Problem:   Sickle cell pain crisis ([name]) Active Problems:   Syncope     In excess of 25 minutes spent during this visit. Greater than 50% involved face to face contact with the patient for assessment, counseling and coordination of care.

## 2016-06-13 NOTE — Telephone Encounter (Signed)
Patient's mother calling in to ask who the [facility] is ran by.  I proceeded to ask questions to clarify, and was cut off.  Patient's mother insisted that she didn't want to speak with me.  She then asked about board of directors.  Before I could answer questions, caller disconnected.

## 2016-06-14 DIAGNOSIS — D638 Anemia in other chronic diseases classified elsewhere: Secondary | ICD-10-CM | POA: Diagnosis not present

## 2016-06-14 DIAGNOSIS — R55 Syncope and collapse: Secondary | ICD-10-CM | POA: Diagnosis not present

## 2016-06-14 DIAGNOSIS — D5700 Hb-SS disease with crisis, unspecified: Secondary | ICD-10-CM | POA: Diagnosis not present

## 2016-06-14 DIAGNOSIS — R400 Somnolence: Secondary | ICD-10-CM | POA: Diagnosis not present

## 2016-06-14 DIAGNOSIS — G43909 Migraine, unspecified, not intractable, without status migrainosus: Secondary | ICD-10-CM | POA: Diagnosis not present

## 2016-06-14 DIAGNOSIS — D72829 Elevated white blood cell count, unspecified: Secondary | ICD-10-CM | POA: Diagnosis not present

## 2016-06-14 LAB — CBC WITH DIFFERENTIAL/PLATELET
BASOS PCT: 1 %
Basophils Absolute: 0.1 10*3/uL (ref 0.0–0.1)
EOS ABS: 1.1 10*3/uL — AB (ref 0.0–0.7)
EOS PCT: 9 %
HCT: 19.9 % — ABNORMAL LOW (ref 36.0–46.0)
Hemoglobin: 6.9 g/dL — CL (ref 12.0–15.0)
Lymphocytes Relative: 22 %
Lymphs Abs: 2.7 10*3/uL (ref 0.7–4.0)
MCH: 30.1 pg (ref 26.0–34.0)
MCHC: 34.7 g/dL (ref 30.0–36.0)
MCV: 86.9 fL (ref 78.0–100.0)
MONO ABS: 1.3 10*3/uL — AB (ref 0.1–1.0)
MONOS PCT: 10 %
Neutro Abs: 7.4 10*3/uL (ref 1.7–7.7)
Neutrophils Relative %: 59 %
PLATELETS: 567 10*3/uL — AB (ref 150–400)
RBC: 2.29 MIL/uL — ABNORMAL LOW (ref 3.87–5.11)
RDW: 19.5 % — AB (ref 11.5–15.5)
WBC: 12.5 10*3/uL — ABNORMAL HIGH (ref 4.0–10.5)

## 2016-06-14 LAB — BASIC METABOLIC PANEL
Anion gap: 5 (ref 5–15)
BUN: 8 mg/dL (ref 6–20)
CALCIUM: 8.5 mg/dL — AB (ref 8.9–10.3)
CO2: 25 mmol/L (ref 22–32)
Chloride: 108 mmol/L (ref 101–111)
Creatinine, Ser: 0.55 mg/dL (ref 0.44–1.00)
GFR calc Af Amer: >60 mL/min (ref >60)
GFR calc non Af Amer: >60 mL/min (ref >60)
GLUCOSE: 112 mg/dL — AB (ref 65–99)
Potassium: 3.9 mmol/L (ref 3.5–5.1)
Sodium: 138 mmol/L (ref 135–145)

## 2016-06-14 LAB — RETICULOCYTES
RBC.: 2.29 MIL/uL — AB (ref 3.87–5.11)
RETIC CT PCT: 18.9 % — AB (ref 0.4–3.1)
Retic Count, Absolute: 432.8 10*3/uL — ABNORMAL HIGH (ref 19.0–186.0)

## 2016-06-14 MED ORDER — OXYCODONE HCL 5 MG PO TABS
15.0000 mg | ORAL_TABLET | ORAL | Status: DC
Start: 2016-06-14 — End: 2016-06-15
  Administered 2016-06-14 – 2016-06-15 (×6): 15 mg via ORAL
  Filled 2016-06-14 (×6): qty 3

## 2016-06-14 MED ORDER — HYDROMORPHONE 1 MG/ML IV SOLN
INTRAVENOUS | Status: DC
  Administered 2016-06-14: 6 mg via INTRAVENOUS
  Administered 2016-06-14: 0.6 mg via INTRAVENOUS
  Filled 2016-06-14: qty 25

## 2016-06-14 NOTE — Care Management Important Message (Signed)
Important Message  Patient Details  Name: [name] MRN: [id number] Date of Birth: [birth year]   Medicare Important Message Given:  Yes    Kerin Salen 06/14/2016, 11:06 AMImportant Message  Patient Details  Name: [name] MRN: [id number] Date of Birth: August 20, 1990   Medicare Important Message Given:  Yes    Kerin Salen 06/14/2016, 11:06 AM

## 2016-06-14 NOTE — Progress Notes (Signed)
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 Syncope: So far all studies injcluding MRA negative. Will discontinue telemetry.   2. Hb SS with crisis: Continue PCA and Toradol at current dose. Discontinue clinician assisted doses and schedule Oxycodone. Continue MS Contin for Chronic pain. 3. Leukocytosis: Pt has mild elevation in WBC but no evidence of infection.  4. Anemia of chronic disease. Hb stable with RPI of 3.3.  Continue Hydrea. 5. Chronic Anticoagulation: Pt on Xarelto chronically for DVT. Continue Xarelto.  6. Chronic pain: Continue MS Contin   Code Status: Full Code Family Communication: N/A Disposition Plan: Anticipate discharge home tomorrow.  MATTHEWS,MICHELLE A.  Pager [phone]. If 7PM-7AM, please contact night-coverage.  06/14/2016, 3:42 PM  LOS: 4 days   Interim History: Pt reports pain still in LUE characteristic of sickle cell crisis at an intensity of 5/10.  She has used 26.79 mg with 46/44: demands/deliveries on the PCA in addition to 14 mg of IV Dilaudid in the last 24 hours.  Consultants:  None  Procedures:  None  Antibiotics:  None   Objective: Vitals:   06/14/16 0800 06/14/16 0819 06/14/16 1200 06/14/16 1226  BP:  121/70  104/66  Pulse:  (!) 110  99  Resp: 12 14 15 12   Temp:  98.7 F (37.1 C)  99.8 F (37.7 C)  TempSrc:  Oral  Oral  SpO2: 99% 100% 100% 98%  Weight:      Height:       Weight change:   Intake/Output Summary (Last 24 hours) at 06/14/16 1542 Last data filed at 06/14/16 1425  Gross per 24 hour  Intake           2032.5 ml  Output             3050 ml  Net          -1017.5 ml    General: Pt A&O x 3 eating lunch.  She is oriented x3, in mild distress due to pain.  HEENT: [place]/AT PEERL, EOMI, anicteric  Heart: Regular rate and rhythm,  without murmurs, rubs, gallops, PMI non-displaced, no heaves or thrills on palpation.  Lungs: Clear to auscultation, no wheezing or rhonchi noted. No increased vocal fremitus resonant to percussion  Abdomen: Soft, nontender, nondistended, positive bowel sounds, no masses no hepatosplenomegaly noted.  Neuro: No focal neurological deficits noted cranial nerves II through XII grossly intact.  Strength at baseline in bilateral upper and lower extremities. Musculoskeletal: No warmth swelling or erythema around joints, no spinal tenderness noted. Psychiatric: Patient alert and oriented x3, good insight and cognition, good recent to remote recall.    Data Reviewed: Basic Metabolic Panel:  Recent Labs Lab 06/10/16 0208 06/11/16 1259 06/14/16 1054  NA 137 138 138  K 3.6 4.1 3.9  CL 105 110 108  CO2 23 25 25   GLUCOSE 99 101* 112*  BUN 11 9 8   CREATININE 0.66 0.53 0.55  CALCIUM 9.0 8.5* 8.5*   Liver Function Tests:  Recent Labs Lab 06/10/16 0208  AST 25  ALT 19  ALKPHOS 51  BILITOT 2.7*  PROT 6.4*  ALBUMIN 4.0   No results for input(s): LIPASE, AMYLASE in the last 168 hours. No results for input(s): AMMONIA in the last 168 hours. CBC:  Recent Labs Lab 06/10/16 0208 06/12/16 0507 06/14/16 1054  WBC 11.5* 12.1*  12.5*  NEUTROABS 6.5 7.5 7.4  HGB 8.2* 7.3* 6.9*  HCT 23.5* 21.6* 19.9*  MCV 87.7 86.7 86.9  PLT 551* 561* 567*   Cardiac Enzymes: No results for input(s): CKTOTAL, CKMB, CKMBINDEX, TROPONINI in the last 168 hours. BNP (last 3 results) No results for input(s): BNP in the last 8760 hours.  ProBNP (last 3 results) No results for input(s): PROBNP in the last 8760 hours.  CBG: No results for input(s): GLUCAP in the last 168 hours.  No results found for this or any previous visit (from the past 240 hour(s)).   Studies: Dg Chest 1 View  Result Date: 05/19/2016 CLINICAL DATA:  Back pain, chest pain and shortness of breath. EXAM: CHEST 1 VIEW COMPARISON:  Body  CT 02/26/2016 FINDINGS: Injectable right IJ approach central venous catheter terminates in the superior vena cava. The cardiac silhouette is mildly enlarged. Mediastinal contours appear intact. There is no evidence of focal airspace consolidation, pleural effusion or pneumothorax. Osseous structures are without acute abnormality. 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 Soft tissues are grossly normal. IMPRESSION: Mildly enlarged cardiac silhouette, otherwise no evidence of acute disease. Electronically Signed   By: Fidela Salisbury M.D.   On: 05/19/2016 20:02   Dg Chest 2 View  Result Date: 05/29/2016 CLINICAL DATA:  Pt with Hx sickle cell anemia c/o abdominal pain and back pain onset last night, cough onset last night. Reports pain to be due to sickle cell anemia GERD, former smoker x 3 years ago EXAM: CHEST  2 VIEW COMPARISON:  05/19/2016 FINDINGS: Cardiac silhouette is normal in size. No mediastinal or hilar masses. No evidence of adenopathy. Clear lungs.  No pleural effusion.  No pneumothorax. Right anterior chest wall Port-A-Cath is stable with its tip in the lower superior vena cava. 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 IMPRESSION: No active cardiopulmonary disease. Electronically Signed   By: Lajean Manes M.D.   On: 05/29/2016 16:42   Dg Thoracic Spine W/swimmers  Result Date: 06/10/2016 CLINICAL DATA:  Pain after fall. EXAM: THORACIC SPINE - 3 VIEWS COMPARISON:  None. FINDINGS: There is no evidence of thoracic spine fracture. H-shaped thoracic vertebra consistent with history of sickle cell anemia. Alignment is normal. No other significant bone abnormalities are identified. Port catheter tip is seen in the lower superior vena cava. IMPRESSION: No acute osseous abnormality of the thoracic spine. Port catheter tip in the distal SVC. H-shaped vertebral bodies consistent with sickle-cell. Electronically Signed   By: Ashley Royalty M.D.   On: 06/10/2016 03:43   Dg Lumbar Spine Complete  Result Date: 06/10/2016 CLINICAL DATA:  Patient fell.  Syncope.  Back pain. EXAM: LUMBAR SPINE - COMPLETE 4+ VIEW COMPARISON:  None. FINDINGS: There is no evidence of lumbar spine fracture. Alignment is normal. Intervertebral disc spaces are maintained. H-shaped vertebral bodies throughout the visualized thoracolumbar spine would be in keeping with vertebral body changes related to sickle cell anemia. Cholecystectomy. IMPRESSION: H-shaped vertebral bodies consistent with sickle cell anemia. No acute osseous abnormality. Electronically Signed   By: Ashley Royalty M.D.   On: 06/10/2016 03:39   Ct Head Wo Contrast  Result Date: 06/10/2016 CLINICAL DATA:  Sickle-cell crisis and migraine. Patient fell. Syncope. EXAM: CT HEAD WITHOUT CONTRAST CT CERVICAL SPINE WITHOUT CONTRAST TECHNIQUE: Multidetector CT imaging of the head and cervical spine was performed following the standard protocol without intravenous contrast. Multiplanar CT image reconstructions of the cervical spine were also generated. COMPARISON:  None. FINDINGS: CT HEAD FINDINGS Brain: No acute intracranial hemorrhage, large vascular territory infarction, edema or midline shift. Ventricles and sulci are within normal limits for age. Parafalcine calcifications are noted anteriorly. Vascular: No large vascular territory infarct. No hyperdense vessels. No unexpected calcifications. Skull: Thickened bony calvarium consistent with history of sickle cell anemia. No acute fracture. Sinuses/Orbits: No acute finding. Other: None. CT CERVICAL SPINE FINDINGS Alignment: Normal Skull base and vertebrae: Thickened bony calvarium consistent with history of sickle cell. H-shaped vertebral bodies noted of the T1 and T2 vertebral bodies consistent with sickle cell. Soft tissues and spinal canal: No prevertebral fluid or swelling. No visible canal hematoma. Disc levels: No focal disc herniation. No significant  neural foraminal encroachment. Upper chest: Negative. Other: None IMPRESSION: Thickened bony calvarium and and H-shaped upper thoracic vertebra consistent with sickle cell anemia. No acute intracranial nor cervical spinal abnormality. Electronically Signed   By: Ashley Royalty M.D.   On: 06/10/2016 03:49   Ct Angio Chest Pe W Or Wo Contrast  Result Date: 06/10/2016 CLINICAL DATA:  26 year old female with syncope. History of sickle cell anemia. EXAM: CT ANGIOGRAPHY CHEST WITH CONTRAST TECHNIQUE: Multidetector CT imaging of the chest was performed using the standard protocol during bolus administration of intravenous contrast. Multiplanar CT image reconstructions and MIPs were obtained to evaluate the vascular anatomy. CONTRAST:  48 cc Isovue 370 COMPARISON:  Chest radiograph dated 05/29/2016 and CT dated 02/26/2016 FINDINGS: Cardiovascular: There is mild cardiomegaly with slight interval increase in the size of the heart since the prior CT. No pericardial effusion. Right pectoral infusion catheter with tip in the right atrium. The thoracic aorta appears unremarkable. The origins of the great vessels of the aortic arch appear patent. Evaluation of the pulmonary arteries is limited due to suboptimal opacification and timing of the contrast. Apparent intraluminal low density involving the segmental branches of the right lower lobe (series 6 image 130) is indeterminate and may be artifactual and related to suboptimal enhancement of the vessel. Pulmonary embolus is not entirely excluded. Correlation with clinical exam recommended. A V/Q scan may provide better evaluation if there is high clinical concern for acute pulmonary embolus. Mediastinum/Nodes: There is no hilar or mediastinal adenopathy. The esophagus is grossly unremarkable. No thyroid nodules identified. Lungs/Pleura: There is a stable 6 mm nodule or 2 adjacent nodules in the superior segment of the left lower lobe as seen on the prior CT. Right upper lobe  linear atelectasis/scarring noted. The lungs are otherwise clear. There is no pleural effusion or pneumothorax. The central airways are patent. Upper Abdomen: Lobulated heterogeneous spleen similar to prior CT likely sequela of sickle cell disease. Musculoskeletal: There is central depression of the thoracic vertebra sequela of sickle cell disease. No acute fracture. Review of the MIP images confirms the above findings. IMPRESSION: This study is essentially nondiagnostic for evaluation of pulmonary embolism due to suboptimal opacification of the pulmonary arteries. Intraluminal low attenuating area involving the segmental branches of the right lower lobe may be artifactual and related to suboptimal opacification of the these branches. Pulmonary embolus is not entirely excluded. Correlation with clinical exam recommended. V/Q scan may provide better evaluation if there is high clinical concern for acute pulmonary embolus. No focal pulmonary consolidation. Stable appearing left lower lobe pulmonary nodule. Mild cardiomegaly with slight interval increase in the cardiac size since the prior CT. Stable appearing heterogeneous and irregular spleen. Electronically Signed   By: Anner Crete M.D.   On: 06/10/2016 07:07   Ct Cervical Spine  Wo Contrast  Result Date: 06/10/2016 CLINICAL DATA:  Sickle-cell crisis and migraine. Patient fell. Syncope. EXAM: CT HEAD WITHOUT CONTRAST CT CERVICAL SPINE WITHOUT CONTRAST TECHNIQUE: Multidetector CT imaging of the head and cervical spine was performed following the standard protocol without intravenous contrast. Multiplanar CT image reconstructions of the cervical spine were also generated. COMPARISON:  None. FINDINGS: CT HEAD FINDINGS Brain: No acute intracranial hemorrhage, large vascular territory infarction, edema or midline shift. Ventricles and sulci are within normal limits for age. Parafalcine calcifications are noted anteriorly. Vascular: No large vascular territory  infarct. No hyperdense vessels. No unexpected calcifications. Skull: Thickened bony calvarium consistent with history of sickle cell anemia. No acute fracture. Sinuses/Orbits: No acute finding. Other: None. CT CERVICAL SPINE FINDINGS Alignment: Normal Skull base and vertebrae: Thickened bony calvarium consistent with history of sickle cell. H-shaped vertebral bodies noted of the T1 and T2 vertebral bodies consistent with sickle cell. Soft tissues and spinal canal: No prevertebral fluid or swelling. No visible canal hematoma. Disc levels: No focal disc herniation. No significant neural foraminal encroachment. Upper chest: Negative. Other: None IMPRESSION: Thickened bony calvarium and and H-shaped upper thoracic vertebra consistent with sickle cell anemia. No acute intracranial nor cervical spinal abnormality. Electronically Signed   By: Ashley Royalty M.D.   On: 06/10/2016 03:49   Mr Jodene Nam Head Wo Contrast  Result Date: 06/13/2016 CLINICAL DATA:  26 y/o  F; sickle cell pain crisis with syncope. EXAM: MRA HEAD WITHOUT CONTRAST TECHNIQUE: Angiographic images of the Circle of Willis were obtained using MRA technique without intravenous contrast. COMPARISON:  06/10/2016 CT head. FINDINGS: Extensive motion artifact from the upper cervical through the circle of Willis to the level of foramen of [place]. Suboptimal assessment for stenosis or aneurysm. Bilateral internal carotid arteries, anterior cerebral arteries, and middle cerebral arteries are patent. Symmetric distal circulation. Bilateral vertebral arteries, the basilar artery, and bilateral posterior cerebral arteries are patent. Symmetric distal circulation. IMPRESSION: Extensive motion artifact through the circle of Willis. No large vessel occlusion. Symmetric distal circulation. Electronically Signed   By: Kristine Garbe M.D.   On: 06/13/2016 19:21   Nm Pulmonary Perf And Vent  Result Date: 06/10/2016 CLINICAL DATA:  Syncope.  Sickle cell. EXAM: NUCLEAR  MEDICINE VENTILATION - PERFUSION LUNG SCAN TECHNIQUE: Ventilation images were obtained in multiple projections using inhaled aerosol Tc-66m DTPA. Perfusion images were obtained in multiple projections after intravenous injection of Tc-60m MAA. RADIOPHARMACEUTICALS:  30.9 mCi Technetium-64m DTPA aerosol inhalation and 4.1 mCi Technetium-28m MAA IV COMPARISON:  Chest x-ray 06/10/2016.  Chest CT 06/10/2016 FINDINGS: No significant focal ventilation-perfusion mismatches noted. This is a low probability scan for pulmonary embolus . IMPRESSION: Low probability pulmonary embolus. Electronically Signed   By: Marcello Moores  Register   On: 06/10/2016 13:34   Dg Chest Port 1 View  Result Date: 06/10/2016 CLINICAL DATA:  Syncope.  History of sickle cell anemia. EXAM: PORTABLE CHEST 1 VIEW COMPARISON:  CT 06/10/2016 . FINDINGS: PowerPort catheter noted with lead tip in the superior vena cava. Mild cardiomegaly. No evidence of congestive heart failure. No pleural effusion or pneumothorax. No acute bony abnormality. Sclerotic changes of both humeral heads most likely from avascular necrosis. IMPRESSION: 1. PowerPort catheter noted with tip projected over superior vena cava. 2. Borderline cardiomegaly.  No CHF.  No acute pulmonary disease. 3.  Avascular necrosis both humeral heads. Electronically Signed   By: Marcello Moores  Register   On: 06/10/2016 13:32   Dg Shoulder Left  Result Date: 05/22/2016 CLINICAL DATA:  26 year old who  fell out of her bed and injured the left shoulder. Initial encounter. Current history of sickle cell disease. EXAM: LEFT SHOULDER - 2+ VIEW COMPARISON:  Left humerus x-rays 04/17/2016. Left shoulder x-rays 05/06/2012, 12/11/2011. FINDINGS: Examination was performed portably, in the axial images therefore less than optimal. No evidence of acute fracture or glenohumeral dislocation. Acromioclavicular joint intact. Osteonecrosis involving the humeral head is again noted, and there is no evidence of osteochondral  fracture. IMPRESSION: No acute osseous abnormality. 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 Electronically Signed   By: Evangeline Dakin M.D.   On: 05/22/2016 16:37   Dg Humerus Left  Result Date: 06/10/2016 CLINICAL DATA:  Patient fell.  Sickle cell anemia. EXAM: LEFT HUMERUS - 2+ VIEW COMPARISON:  05/22/2016 left shoulder radiographs FINDINGS: Informed sclerotic appearance of the left humeral head consistent with osteonecrosis. No loss of the sphericity of the humeral head on current exam. No acute fracture nor bone destruction. IMPRESSION: Mild sclerosis of the humeral head consistent with osteonecrosis. No acute osseous abnormality. Electronically Signed   By: Ashley Royalty M.D.   On: 06/10/2016 03:41    Scheduled Meds: . HYDROmorphone   Intravenous Q4H  . hydroxyurea  1,000 mg Oral Q breakfast  . ketorolac  30 mg Intravenous Q6H  . morphine  30 mg Oral Q12H  . oxyCODONE  15 mg Oral Q4H  . rivaroxaban  20 mg Oral Q supper  . senna-docusate  1 tablet Oral BID  . sodium chloride flush  10-40 mL Intracatheter Q12H   Continuous Infusions: . 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 Greater than 50% involved face to face contact with the patient for assessment, counseling and coordination of care.

## 2016-06-14 NOTE — Progress Notes (Signed)
CRITICAL VALUE ALERT  Critical value received:  Hemoglobin 6.9  Date of notification:  06/14/2016  Time of notification:  [id number]  Critical value read back:Yes.    Nurse who received alert:  Tinnie Gens  MD notified (1st page):  Liston Alba MD  Time of first page:  1147  MD notified (2nd page):  Time of second page:  Responding MD:    Time MD responded:

## 2016-06-15 ENCOUNTER — Other Ambulatory Visit: Payer: Self-pay | Admitting: Internal Medicine

## 2016-06-15 DIAGNOSIS — D638 Anemia in other chronic diseases classified elsewhere: Secondary | ICD-10-CM | POA: Diagnosis not present

## 2016-06-15 DIAGNOSIS — Z7901 Long term (current) use of anticoagulants: Secondary | ICD-10-CM

## 2016-06-15 DIAGNOSIS — D5700 Hb-SS disease with crisis, unspecified: Secondary | ICD-10-CM | POA: Diagnosis not present

## 2016-06-15 DIAGNOSIS — R55 Syncope and collapse: Secondary | ICD-10-CM | POA: Diagnosis not present

## 2016-06-15 MED ORDER — OXYCODONE HCL 15 MG PO TABS: 15.0000 mg | ORAL_TABLET | ORAL | 0 refills | Status: AC | PRN

## 2016-06-15 MED ORDER — HEPARIN SOD (PORK) LOCK FLUSH 100 UNIT/ML IV SOLN: 500.0000 [IU] | INTRAVENOUS | Status: DC | PRN

## 2016-06-15 NOTE — Discharge Summary (Signed)
[name] MRN: [id number] DOB/AGE: 1990-07-05 26 y.o.  Admit date: 06/10/2016 Discharge date: 06/15/2016  Primary Care Physician:  Angelica Chessman, MD   Discharge Diagnoses:   Patient Active Problem List   Diagnosis Date Noted  . Major depression, chronic 01/06/2011    Priority: High  . Sickle cell disease ([place]) 01/08/2009    Priority: High  . Sickle cell anemia ([place]) 05/19/2016  . History of DVT (deep vein thrombosis) 04/17/2016  . Post partum depression 04/03/2016  . Episode of recurrent major depressive disorder ([place])   . Sickle cell anemia with pain ([place]) 02/16/2016  . Thrombocytosis ([place]) 11/09/2015  . Hyperbilirubinemia 11/09/2015  . Narcotic dependence ([place]) 10/06/2015  . 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 High risk for intrapartum complications, antepartum 08/12/2015  . Chronic pain 08/04/2015  . Cocaine use 07/24/2015  . Herpes simplex 07/14/2015  . Anemia of chronic disease   . 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 Trichotillomania 01/08/2009    DISCHARGE MEDICATION: Allergies as of 06/15/2016      Reactions   Food Hives, Other (See Comments)   Pt states that she is allergic to carrots.    Latex Rash      Medication List    TAKE these medications   aspirin-acetaminophen-caffeine 250-250-65 MG tablet Commonly known as:  EXCEDRIN MIGRAINE Take 2 tablets by mouth every 6 (six) hours as needed for headache.   diphenhydrAMINE 25 MG tablet Commonly known as:  BENADRYL Take 25 mg by mouth at bedtime as needed for sleep.   hydroxyurea 500 MG capsule Commonly known as:  HYDREA Take 2 capsules (1,000 mg total) by mouth daily. May take with food to minimize GI side effects.   ibuprofen 600 MG tablet Commonly known as:  ADVIL,MOTRIN Take 1 tablet (600 mg total) by mouth every 6 (six) hours as needed for mild pain or moderate pain.   morphine 30 MG 12 hr tablet Commonly known as:  MS CONTIN Take 1 tablet (30 mg total) by  mouth every 12 (twelve) hours.   oxyCODONE 15 MG immediate release tablet Commonly known as:  ROXICODONE Take 1 tablet (15 mg total) by mouth every 4 (four) hours as needed for pain.   rivaroxaban 20 MG Tabs tablet Commonly known as:  XARELTO Take 20 mg by mouth daily with supper.   valACYclovir 1000 MG tablet Commonly known as:  VALTREX Take 1 tablet (1,000 mg total) by mouth daily. What changed:  when to take this  reasons to take this         Consults:    SIGNIFICANT DIAGNOSTIC STUDIES:  Dg Chest 1 View  Result Date: 05/19/2016 CLINICAL DATA:  Back pain, chest pain and shortness of breath. EXAM: CHEST 1 VIEW COMPARISON:  Body CT 02/26/2016 FINDINGS: Injectable right IJ approach central venous catheter terminates in the superior vena cava. The cardiac silhouette is mildly enlarged. Mediastinal contours appear intact. There is no evidence of focal airspace consolidation, pleural effusion or pneumothorax. Osseous structures are without acute abnormality. 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 Soft tissues are grossly normal. IMPRESSION: Mildly enlarged cardiac silhouette, otherwise no evidence of acute disease. Electronically Signed   By: Fidela Salisbury M.D.   On: 05/19/2016 20:02   Dg Chest 2 View  Result Date: 05/29/2016 CLINICAL DATA:  Pt with Hx sickle cell anemia c/o abdominal pain and back pain onset last night, cough onset last night. Reports pain to be due to sickle cell  anemia GERD, former smoker x 3 years ago EXAM: CHEST  2 VIEW COMPARISON:  05/19/2016 FINDINGS: Cardiac silhouette is normal in size. No mediastinal or hilar masses. No evidence of adenopathy. Clear lungs.  No pleural effusion.  No pneumothorax. Right anterior chest wall Port-A-Cath is stable with its tip in the lower superior vena cava. 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 IMPRESSION: No active cardiopulmonary disease.  Electronically Signed   By: Lajean Manes M.D.   On: 05/29/2016 16:42   Dg Thoracic Spine W/swimmers  Result Date: 06/10/2016 CLINICAL DATA:  Pain after fall. EXAM: THORACIC SPINE - 3 VIEWS COMPARISON:  None. FINDINGS: There is no evidence of thoracic spine fracture. H-shaped thoracic vertebra consistent with history of sickle cell anemia. Alignment is normal. No other significant bone abnormalities are identified. Port catheter tip is seen in the lower superior vena cava. IMPRESSION: No acute osseous abnormality of the thoracic spine. Port catheter tip in the distal SVC. H-shaped vertebral bodies consistent with sickle-cell. Electronically Signed   By: Ashley Royalty M.D.   On: 06/10/2016 03:43   Dg Lumbar Spine Complete  Result Date: 06/10/2016 CLINICAL DATA:  Patient fell.  Syncope.  Back pain. EXAM: LUMBAR SPINE - COMPLETE 4+ VIEW COMPARISON:  None. FINDINGS: There is no evidence of lumbar spine fracture. Alignment is normal. Intervertebral disc spaces are maintained. H-shaped vertebral bodies throughout the visualized thoracolumbar spine would be in keeping with vertebral body changes related to sickle cell anemia. Cholecystectomy. IMPRESSION: H-shaped vertebral bodies consistent with sickle cell anemia. No acute osseous abnormality. Electronically Signed   By: Ashley Royalty M.D.   On: 06/10/2016 03:39   Ct Head Wo Contrast  Result Date: 06/10/2016 CLINICAL DATA:  Sickle-cell crisis and migraine. Patient fell. Syncope. EXAM: CT HEAD WITHOUT CONTRAST CT CERVICAL SPINE WITHOUT CONTRAST TECHNIQUE: Multidetector CT imaging of the head and cervical spine was performed following the standard protocol without intravenous contrast. Multiplanar CT image reconstructions of the cervical spine were also generated. COMPARISON:  None. FINDINGS: CT HEAD FINDINGS Brain: No acute intracranial hemorrhage, large vascular territory infarction, edema or midline shift. Ventricles and sulci are within normal limits for age.  Parafalcine calcifications are noted anteriorly. Vascular: No large vascular territory infarct. No hyperdense vessels. No unexpected calcifications. Skull: Thickened bony calvarium consistent with history of sickle cell anemia. No acute fracture. Sinuses/Orbits: No acute finding. Other: None. CT CERVICAL SPINE FINDINGS Alignment: Normal Skull base and vertebrae: Thickened bony calvarium consistent with history of sickle cell. H-shaped vertebral bodies noted of the T1 and T2 vertebral bodies consistent with sickle cell. Soft tissues and spinal canal: No prevertebral fluid or swelling. No visible canal hematoma. Disc levels: No focal disc herniation. No significant neural foraminal encroachment. Upper chest: Negative. Other: None IMPRESSION: Thickened bony calvarium and and H-shaped upper thoracic vertebra consistent with sickle cell anemia. No acute intracranial nor cervical spinal abnormality. Electronically Signed   By: Ashley Royalty M.D.   On: 06/10/2016 03:49   Ct Angio Chest Pe W Or Wo Contrast  Result Date: 06/10/2016 CLINICAL DATA:  26 year old female with syncope. History of sickle cell anemia. EXAM: CT ANGIOGRAPHY CHEST WITH CONTRAST TECHNIQUE: Multidetector CT imaging of the chest was performed using the standard protocol during bolus administration of intravenous contrast. Multiplanar CT image reconstructions and MIPs were obtained to evaluate the vascular anatomy. CONTRAST:  48 cc Isovue 370 COMPARISON:  Chest radiograph dated 05/29/2016 and CT dated 02/26/2016 FINDINGS: Cardiovascular: There is mild cardiomegaly with slight interval  increase in the size of the heart since the prior CT. No pericardial effusion. Right pectoral infusion catheter with tip in the right atrium. The thoracic aorta appears unremarkable. The origins of the great vessels of the aortic arch appear patent. Evaluation of the pulmonary arteries is limited due to suboptimal opacification and timing of the contrast. Apparent  intraluminal low density involving the segmental branches of the right lower lobe (series 6 image 130) is indeterminate and may be artifactual and related to suboptimal enhancement of the vessel. Pulmonary embolus is not entirely excluded. Correlation with clinical exam recommended. A V/Q scan may provide better evaluation if there is high clinical concern for acute pulmonary embolus. Mediastinum/Nodes: There is no hilar or mediastinal adenopathy. The esophagus is grossly unremarkable. No thyroid nodules identified. Lungs/Pleura: There is a stable 6 mm nodule or 2 adjacent nodules in the superior segment of the left lower lobe as seen on the prior CT. Right upper lobe linear atelectasis/scarring noted. The lungs are otherwise clear. There is no pleural effusion or pneumothorax. The central airways are patent. Upper Abdomen: Lobulated heterogeneous spleen similar to prior CT likely sequela of sickle cell disease. Musculoskeletal: There is central depression of the thoracic vertebra sequela of sickle cell disease. No acute fracture. Review of the MIP images confirms the above findings. IMPRESSION: This study is essentially nondiagnostic for evaluation of pulmonary embolism due to suboptimal opacification of the pulmonary arteries. Intraluminal low attenuating area involving the segmental branches of the right lower lobe may be artifactual and related to suboptimal opacification of the these branches. Pulmonary embolus is not entirely excluded. Correlation with clinical exam recommended. V/Q scan may provide better evaluation if there is high clinical concern for acute pulmonary embolus. No focal pulmonary consolidation. Stable appearing left lower lobe pulmonary nodule. Mild cardiomegaly with slight interval increase in the cardiac size since the prior CT. Stable appearing heterogeneous and irregular spleen. Electronically Signed   By: Anner Crete M.D.   On: 06/10/2016 07:07   Ct Cervical Spine Wo  Contrast  Result Date: 06/10/2016 CLINICAL DATA:  Sickle-cell crisis and migraine. Patient fell. Syncope. EXAM: CT HEAD WITHOUT CONTRAST CT CERVICAL SPINE WITHOUT CONTRAST TECHNIQUE: Multidetector CT imaging of the head and cervical spine was performed following the standard protocol without intravenous contrast. Multiplanar CT image reconstructions of the cervical spine were also generated. COMPARISON:  None. FINDINGS: CT HEAD FINDINGS Brain: No acute intracranial hemorrhage, large vascular territory infarction, edema or midline shift. Ventricles and sulci are within normal limits for age. Parafalcine calcifications are noted anteriorly. Vascular: No large vascular territory infarct. No hyperdense vessels. No unexpected calcifications. Skull: Thickened bony calvarium consistent with history of sickle cell anemia. No acute fracture. Sinuses/Orbits: No acute finding. Other: None. CT CERVICAL SPINE FINDINGS Alignment: Normal Skull base and vertebrae: Thickened bony calvarium consistent with history of sickle cell. H-shaped vertebral bodies noted of the T1 and T2 vertebral bodies consistent with sickle cell. Soft tissues and spinal canal: No prevertebral fluid or swelling. No visible canal hematoma. Disc levels: No focal disc herniation. No significant neural foraminal encroachment. Upper chest: Negative. Other: None IMPRESSION: Thickened bony calvarium and and H-shaped upper thoracic vertebra consistent with sickle cell anemia. No acute intracranial nor cervical spinal abnormality. Electronically Signed   By: Ashley Royalty M.D.   On: 06/10/2016 03:49   Mr Jodene Nam Head Wo Contrast  Result Date: 06/13/2016 CLINICAL DATA:  26 y/o  F; sickle cell pain crisis with syncope. EXAM: MRA HEAD WITHOUT CONTRAST TECHNIQUE: Angiographic  images of the Circle of Willis were obtained using MRA technique without intravenous contrast. COMPARISON:  06/10/2016 CT head. FINDINGS: Extensive motion artifact from the upper cervical through  the circle of Willis to the level of foramen of [place]. Suboptimal assessment for stenosis or aneurysm. Bilateral internal carotid arteries, anterior cerebral arteries, and middle cerebral arteries are patent. Symmetric distal circulation. Bilateral vertebral arteries, the basilar artery, and bilateral posterior cerebral arteries are patent. Symmetric distal circulation. IMPRESSION: Extensive motion artifact through the circle of Willis. No large vessel occlusion. Symmetric distal circulation. Electronically Signed   By: Kristine Garbe M.D.   On: 06/13/2016 19:21   Nm Pulmonary Perf And Vent  Result Date: 06/10/2016 CLINICAL DATA:  Syncope.  Sickle cell. EXAM: NUCLEAR MEDICINE VENTILATION - PERFUSION LUNG SCAN TECHNIQUE: Ventilation images were obtained in multiple projections using inhaled aerosol Tc-47m DTPA. Perfusion images were obtained in multiple projections after intravenous injection of Tc-1m MAA. RADIOPHARMACEUTICALS:  30.9 mCi Technetium-36m DTPA aerosol inhalation and 4.1 mCi Technetium-56m MAA IV COMPARISON:  Chest x-ray 06/10/2016.  Chest CT 06/10/2016 FINDINGS: No significant focal ventilation-perfusion mismatches noted. This is a low probability scan for pulmonary embolus . IMPRESSION: Low probability pulmonary embolus. Electronically Signed   By: Marcello Moores  Register   On: 06/10/2016 13:34   Dg Chest Port 1 View  Result Date: 06/10/2016 CLINICAL DATA:  Syncope.  History of sickle cell anemia. EXAM: PORTABLE CHEST 1 VIEW COMPARISON:  CT 06/10/2016 . FINDINGS: PowerPort catheter noted with lead tip in the superior vena cava. Mild cardiomegaly. No evidence of congestive heart failure. No pleural effusion or pneumothorax. No acute bony abnormality. Sclerotic changes of both humeral heads most likely from avascular necrosis. IMPRESSION: 1. PowerPort catheter noted with tip projected over superior vena cava. 2. Borderline cardiomegaly.  No CHF.  No acute pulmonary disease. 3.  Avascular  necrosis both humeral heads. Electronically Signed   By: Marcello Moores  Register   On: 06/10/2016 13:32   Dg Shoulder Left  Result Date: 05/22/2016 CLINICAL DATA:  26 year old who fell out of her bed and injured the left shoulder. Initial encounter. Current history of sickle cell disease. EXAM: LEFT SHOULDER - 2+ VIEW COMPARISON:  Left humerus x-rays 04/17/2016. Left shoulder x-rays 05/06/2012, 12/11/2011. FINDINGS: Examination was performed portably, in the axial images therefore less than optimal. No evidence of acute fracture or glenohumeral dislocation. Acromioclavicular joint intact. Osteonecrosis involving the humeral head is again noted, and there is no evidence of osteochondral fracture. IMPRESSION: No acute osseous abnormality. 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 Electronically Signed   By: Evangeline Dakin M.D.   On: 05/22/2016 16:37   Dg Humerus Left  Result Date: 06/10/2016 CLINICAL DATA:  Patient fell.  Sickle cell anemia. EXAM: LEFT HUMERUS - 2+ VIEW COMPARISON:  05/22/2016 left shoulder radiographs FINDINGS: Informed sclerotic appearance of the left humeral head consistent with osteonecrosis. No loss of the sphericity of the humeral head on current exam. No acute fracture nor bone destruction. IMPRESSION: Mild sclerosis of the humeral head consistent with osteonecrosis. No acute osseous abnormality. Electronically Signed   By: Ashley Royalty M.D.   On: 06/10/2016 03:41      No results found for this or any previous visit (from the past 240 hour(s)).  BRIEF ADMITTING H & P: Miquelle Hunkele Jemmott is a 26 y.o. female with medical history significant ofsickle cell disease requiring frequent admissions who presents to the ED following a witnessed syncopal episode earlier this evening. According to medic report, this patient was walking  throughout her home this evening when family members saw the patient syncopize, fall forward from standing. She struck her face against the floor. No  obvious trauma with medic. She has been ambulatory with medic. At interview, the patient is reporting midlineback pain which she believes is the result of her fall. Pain is 10/10 in severity. She denies any preceding symptoms prior to her fall. She is also reporting 9/10 pain along her L arm for 2 days which is consistent with her typical sickle cell pain crisis presentations. She denies any nausea or vomiting. Denies any chest pain or head pain. She denies any changes in sensation or strength distally. She has no other complaints at this time.  Patient denies overuse of narcotic medications at home and states she is taking them as prescribed.    Hospital Course:  Present on Admission: . (Resolved) Sickle cell pain crisis ([place]) . (Resolved) Syncope   Opiate tolerant patient with Hb SS admitted with Syncopal episode and Sickle Cell Crisis. She was worked up for Syncope including CTA, V/Q scan, MRA, CT head and Cervical Spine, Telemetry monitoring and ECHO all of which were negative. She had no further episodes of syncope or dizziness. For her sickle cell Crisis, she was managed with Dilaudid via the PCA, Toradol and IVF. As her pain decreased, she was transitioned to oral analgesics and is being discharged home on her Pre-hospital regimen of MS Contin and Oxycodone. Pt initially stated that she did not have her medications, however upon review if the [facility] it indicated that she had MS Contin filled on 05/31/2016 for 30 days and Oxycodone 10 mg also filled on 1/16 for 15 days. When confronted with this information, she admitted that she had her MS Contin for another 2 weeks but had only 11 tabs of the Oxycodone remaining. I spoke with Dr. Doreene Burke- her PMD and he asked that she be given a prescription for 5 days of Oxycodone and he would see her in the clinic thereafter.   Disposition and Follow-up: Pt discharged home in good condition and is to follow up with her PMD within one week. A Prescription for  Oxycodone 15 mg #30 tabs was given to patient at the time of discharge at the request of Dr. Doreene Burke.  Discharge Instructions    Activity as tolerated - No restrictions    Complete by:  As directed    Diet general    Complete by:  As directed       DISCHARGE EXAM:  General: Alert, awake, oriented x3, in no apparent distress.  HEENT: [place]/AT PEERL, EOMI, anicteric Neck: Trachea midline, no masses, no thyromegal,y no JVD, no carotid bruit OROPHARYNX: Moist, No exudate/ erythema/lesions.  Heart: Regular rate and rhythm, without murmurs, rubs, gallops or S3. PMI non-displaced. Exam reveals no decreased pulses. Pulmonary/Chest: Normal effort. Breath sounds normal. No. Apnea. Clear to auscultation,no stridor,  no wheezing and no rhonchi noted. No respiratory distress and no tenderness noted. Abdomen: Soft, nontender, nondistended, normal bowel sounds, no masses no hepatosplenomegaly noted. No fluid wave and no ascites. There is no guarding or rebound. Neuro: Alert and oriented to person, place and time. Normal motor skills, Displays no atrophy or tremors and exhibits normal muscle tone.  No focal neurological deficits noted cranial nerves II through XII grossly intact. No sensory deficit noted. Strength at baseline in bilateral upper and lower extremities. Gait normal. Musculoskeletal: No warmth swelling or erythema around joints, no spinal tenderness noted. Psychiatric: Patient alert and oriented x3, good  insight and cognition, good recent to remote recall. Mood, affect and judgement normal Lymph node survey: No cervical axillary or inguinal lymphadenopathy noted. Skin: Skin is warm and dry. No bruising, no ecchymosis and no rash noted. Pt is not diaphoretic. No erythema. No pallor    Blood pressure (!) 104/53, pulse 100, temperature 98.5 F (36.9 C), temperature source Oral, resp. rate 16, height 5\' 2"  (1.575 m), weight 83.9 kg (185 lb), last menstrual period 04/09/2016, SpO2 97 %, not  currently breastfeeding.   Recent Labs  06/14/16 1054  NA 138  K 3.9  CL 108  CO2 25  GLUCOSE 112*  BUN 8  CREATININE 0.55  CALCIUM 8.5*   No results for input(s): AST, ALT, ALKPHOS, BILITOT, PROT, ALBUMIN in the last 72 hours. No results for input(s): LIPASE, AMYLASE in the last 72 hours.  Recent Labs  06/14/16 1054  WBC 12.5*  NEUTROABS 7.4  HGB 6.9*  HCT 19.9*  MCV 86.9  PLT 567*     Total time spent including face to face and decision making was greater than 30 minutes  Signed: Michall Noffke A. 06/15/2016, 8:52 AM

## 2016-06-15 NOTE — Progress Notes (Signed)
IV team called at 1107 to deaccess PAC.  When team arrived @ 1115, pt was in bathroom and would not come out.  Team left and told pt she would be back.  Pt family was here to pick pt up for discharge @ 1140.  Pt removed own needle from [facility] port.  She would not wait for team to come back (which returned @ 1155).  Pt left with discharge instructions.    Danton Clap, RN

## 2016-06-15 NOTE — Progress Notes (Signed)
Pt discharged to home with friend.  Pt verbalized understanding of discharge instructions and follow up care.  All belongings sent home with pt including cell phone.  Education provided re: pain management, when to call MD and follow up care.  Danton Clap, RN

## 2016-06-23 ENCOUNTER — Emergency Department (HOSPITAL_COMMUNITY)
Admission: EM | Admit: 2016-06-23 | Discharge: 2016-06-24 | Disposition: A | Discharge status: Home / Self Care | Payer: Medicare Other | Source: Home / Self Care | Attending: Emergency Medicine | Admitting: Emergency Medicine

## 2016-06-23 ENCOUNTER — Encounter (HOSPITAL_COMMUNITY): Payer: Self-pay | Admitting: Emergency Medicine

## 2016-06-23 DIAGNOSIS — Z832 Family history of diseases of the blood and blood-forming organs and certain disorders involving the immune mechanism: Secondary | ICD-10-CM

## 2016-06-23 DIAGNOSIS — Z9049 Acquired absence of other specified parts of digestive tract: Secondary | ICD-10-CM | POA: Diagnosis not present

## 2016-06-23 DIAGNOSIS — Z87891 Personal history of nicotine dependence: Secondary | ICD-10-CM

## 2016-06-23 DIAGNOSIS — D5700 Hb-SS disease with crisis, unspecified: Secondary | ICD-10-CM | POA: Insufficient documentation

## 2016-06-23 DIAGNOSIS — F329 Major depressive disorder, single episode, unspecified: Secondary | ICD-10-CM | POA: Diagnosis present

## 2016-06-23 DIAGNOSIS — Z7901 Long term (current) use of anticoagulants: Secondary | ICD-10-CM

## 2016-06-23 DIAGNOSIS — Z91040 Latex allergy status: Secondary | ICD-10-CM

## 2016-06-23 DIAGNOSIS — G894 Chronic pain syndrome: Secondary | ICD-10-CM | POA: Diagnosis present

## 2016-06-23 DIAGNOSIS — Z79899 Other long term (current) drug therapy: Secondary | ICD-10-CM

## 2016-06-23 DIAGNOSIS — K219 Gastro-esophageal reflux disease without esophagitis: Secondary | ICD-10-CM | POA: Diagnosis not present

## 2016-06-23 DIAGNOSIS — Z6832 Body mass index (BMI) 32.0-32.9, adult: Secondary | ICD-10-CM | POA: Diagnosis not present

## 2016-06-23 DIAGNOSIS — Z8249 Family history of ischemic heart disease and other diseases of the circulatory system: Secondary | ICD-10-CM | POA: Diagnosis not present

## 2016-06-23 DIAGNOSIS — N920 Excessive and frequent menstruation with regular cycle: Secondary | ICD-10-CM | POA: Diagnosis not present

## 2016-06-23 DIAGNOSIS — Z79891 Long term (current) use of opiate analgesic: Secondary | ICD-10-CM

## 2016-06-23 DIAGNOSIS — R918 Other nonspecific abnormal finding of lung field: Secondary | ICD-10-CM | POA: Diagnosis not present

## 2016-06-23 DIAGNOSIS — D638 Anemia in other chronic diseases classified elsewhere: Secondary | ICD-10-CM | POA: Diagnosis present

## 2016-06-23 DIAGNOSIS — I82622 Acute embolism and thrombosis of deep veins of left upper extremity: Secondary | ICD-10-CM | POA: Diagnosis present

## 2016-06-23 DIAGNOSIS — Z833 Family history of diabetes mellitus: Secondary | ICD-10-CM | POA: Diagnosis not present

## 2016-06-23 DIAGNOSIS — Z7982 Long term (current) use of aspirin: Secondary | ICD-10-CM | POA: Insufficient documentation

## 2016-06-23 DIAGNOSIS — Z91018 Allergy to other foods: Secondary | ICD-10-CM | POA: Diagnosis not present

## 2016-06-23 DIAGNOSIS — K120 Recurrent oral aphthae: Secondary | ICD-10-CM | POA: Diagnosis not present

## 2016-06-23 DIAGNOSIS — G43909 Migraine, unspecified, not intractable, without status migrainosus: Secondary | ICD-10-CM | POA: Diagnosis present

## 2016-06-23 DIAGNOSIS — M545 Low back pain: Secondary | ICD-10-CM | POA: Diagnosis present

## 2016-06-23 LAB — COMPREHENSIVE METABOLIC PANEL WITH GFR
ALT: 18 U/L (ref 14–54)
AST: 23 U/L (ref 15–41)
Albumin: 4.3 g/dL (ref 3.5–5.0)
Alkaline Phosphatase: 54 U/L (ref 38–126)
Anion gap: 7 (ref 5–15)
BUN: 12 mg/dL (ref 6–20)
CO2: 24 mmol/L (ref 22–32)
Calcium: 8.9 mg/dL (ref 8.9–10.3)
Chloride: 107 mmol/L (ref 101–111)
Creatinine, Ser: 0.72 mg/dL (ref 0.44–1.00)
GFR calc Af Amer: >60 mL/min
GFR calc non Af Amer: >60 mL/min
Glucose, Bld: 125 mg/dL — ABNORMAL HIGH (ref 65–99)
Potassium: 3.5 mmol/L (ref 3.5–5.1)
Sodium: 138 mmol/L (ref 135–145)
Total Bilirubin: 1.8 mg/dL — ABNORMAL HIGH (ref 0.3–1.2)
Total Protein: 7.1 g/dL (ref 6.5–8.1)

## 2016-06-23 LAB — CBC WITH DIFFERENTIAL/PLATELET
BASOS ABS: 0.1 10*3/uL (ref 0.0–0.1)
BASOS PCT: 0 %
EOS ABS: 0.6 10*3/uL (ref 0.0–0.7)
EOS PCT: 5 %
HCT: 23.9 % — ABNORMAL LOW (ref 36.0–46.0)
Hemoglobin: 8.5 g/dL — ABNORMAL LOW (ref 12.0–15.0)
Lymphocytes Relative: 28 %
Lymphs Abs: 3.8 10*3/uL (ref 0.7–4.0)
MCH: 32 pg (ref 26.0–34.0)
MCHC: 35.6 g/dL (ref 30.0–36.0)
MCV: 89.8 fL (ref 78.0–100.0)
MONO ABS: 0.9 10*3/uL (ref 0.1–1.0)
MONOS PCT: 7 %
NEUTROS ABS: 8.2 10*3/uL — AB (ref 1.7–7.7)
Neutrophils Relative %: 60 %
PLATELETS: 509 10*3/uL — AB (ref 150–400)
RBC: 2.66 MIL/uL — ABNORMAL LOW (ref 3.87–5.11)
RDW: 17.9 % — AB (ref 11.5–15.5)
WBC: 13.6 10*3/uL — ABNORMAL HIGH (ref 4.0–10.5)

## 2016-06-23 LAB — RETICULOCYTES
RBC.: 2.66 MIL/uL — AB (ref 3.87–5.11)
RETIC COUNT ABSOLUTE: 383 10*3/uL — AB (ref 19.0–186.0)
RETIC CT PCT: 14.4 % — AB (ref 0.4–3.1)

## 2016-06-23 LAB — PREGNANCY, URINE: Preg Test, Ur: NEGATIVE

## 2016-06-23 MED ORDER — HYDROMORPHONE HCL 2 MG/ML IJ SOLN
2.0000 mg | INTRAMUSCULAR | Status: AC
  Administered 2016-06-23 – 2016-06-24 (×3): 2 mg via INTRAVENOUS
  Filled 2016-06-23 (×3): qty 1

## 2016-06-23 MED ORDER — KETOROLAC TROMETHAMINE 30 MG/ML IJ SOLN
30.0000 mg | Freq: Once | INTRAMUSCULAR | Status: AC
  Administered 2016-06-23: 30 mg via INTRAVENOUS
  Filled 2016-06-23: qty 1

## 2016-06-23 MED ORDER — HYDROMORPHONE HCL 1 MG/ML IJ SOLN
0.5000 mg | Freq: Once | INTRAMUSCULAR | Status: AC
  Administered 2016-06-23: 0.5 mg via SUBCUTANEOUS
  Filled 2016-06-23: qty 1

## 2016-06-23 MED ORDER — DIPHENHYDRAMINE HCL 50 MG/ML IJ SOLN
25.0000 mg | Freq: Once | INTRAMUSCULAR | Status: AC
  Administered 2016-06-23: 25 mg via INTRAVENOUS
  Filled 2016-06-23: qty 1

## 2016-06-23 MED ORDER — ONDANSETRON HCL 4 MG/2ML IJ SOLN
4.0000 mg | Freq: Once | INTRAMUSCULAR | Status: AC
  Administered 2016-06-23: 4 mg via INTRAVENOUS
  Filled 2016-06-23: qty 2

## 2016-06-23 MED ORDER — SODIUM CHLORIDE 0.9 % IV BOLUS (SEPSIS)
1000.0000 mL | Freq: Once | INTRAVENOUS | Status: AC
  Administered 2016-06-23: 1000 mL via INTRAVENOUS

## 2016-06-23 NOTE — ED Triage Notes (Signed)
Per pt, states back pain for 2 days-states she has been hydrating self and taking home meds without relief

## 2016-06-23 NOTE — ED Provider Notes (Signed)
[facility] DEPT Provider Note   CSN: [id number] Arrival date & time: 06/23/16  1747    History   Chief Complaint Chief Complaint  Patient presents with  . Sickle Cell Pain Crisis    HPI [name] is a 26 y.o. female.  26 year old female with a history of sickle cell anemia presents to the emergency department for evaluation of pain consistent with sickle cell crisis. Patient states that she has been happening aching pain in her back 2 days. She believes that her worsening symptoms are due to her menstrual cycle. She states that she has been taking her home pain medications and hydroxyurea without relief. She has been hydrating well with fluids. She denies missing any doses of her Xarelto. Patient does state that she recently changed from Dr. Doreene Burke at the [facility]le cell clinic to Dr. Noah Delaine. She reports that Dr. Doreene Burke wanted her to sign a contract requiring her to present to the clinic for pill counts every 48 hours.     Past Medical History:  Diagnosis Date  . Blood transfusion    "lots"  . Blood transfusion without reported diagnosis   . Chronic back pain    "very severe; have knot in my back; from tight muscle; take RX and exercise for it"  . Depression 01/06/2011  . Exertional dyspnea    "sometimes"  . Genital HSV   . GERD (gastroesophageal reflux disease) 02/17/2011  . Migraines 11/08/11   "@ least twice/month"  . 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  . Mood swings ([place]) 11/08/11   "I go back and forth; real bad"  . Sickle cell anemia ([name])   . Sickle cell anemia with crisis ([place])   . Trichotillomania    h/o    Patient Active Problem List   Diagnosis Date Noted  . Chronic anticoagulation   . Sickle cell anemia ([place]) 05/19/2016  . History of DVT (deep vein thrombosis) 04/17/2016  . Post partum depression 04/03/2016  . 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 Sickle cell anemia with pain ([place]) 02/16/2016  . Thrombocytosis  ([place]) 11/09/2015  . Hyperbilirubinemia 11/09/2015  . Narcotic dependence ([place]) 10/06/2015  . 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 High risk for intrapartum complications, antepartum 08/12/2015  . Chronic pain 08/04/2015  . Cocaine use 07/24/2015  . Herpes simplex 07/14/2015  . Anemia of chronic disease   . 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 Sickle cell disease ([place]) 01/08/2009  . 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 CHOLECYSTECTOMY  05/2010  . DILATION AND CURETTAGE OF UTERUS  02/20/11   S/P miscarriage  . IR GENERIC HISTORICAL  12/23/2015   IR FLUORO GUIDE CV LINE RIGHT 12/23/2015 Jacqulynn Cadet, MD [facility]  . IR GENERIC HISTORICAL  12/23/2015   IR US GUIDE VASC ACCESS RIGHT 12/23/2015 Jacqulynn Cadet, MD [facility]    OB History    Gravida Para Term Preterm AB Living   3 2 2   1 2    SAB TAB Ectopic Multiple Live Births   1     0 2      Obstetric Comments   Miscarried in October 2012 at about 7 weeks       Home Medications    Prior to Admission medications   Medication Sig Start Date End Date Taking? Authorizing Provider  aspirin-acetaminophen-caffeine (EXCEDRIN MIGRAINE) [id number] MG tablet Take 2 tablets  by mouth every 6 (six) hours as needed for headache.    Yes Historical Provider, MD  diphenhydrAMINE (BENADRYL) 25 MG tablet Take 25 mg by mouth at bedtime as needed for sleep.   Yes Historical Provider, MD  hydroxyurea (HYDREA) 500 MG capsule Take 2 capsules (1,000 mg total) by mouth daily. May take with food to minimize GI side effects. 04/18/16  Yes Tresa Garter, MD  ibuprofen (ADVIL,MOTRIN) 600 MG tablet Take 1 tablet (600 mg total) by mouth every 6 (six) hours as needed for mild pain or moderate pain. 03/14/16  Yes Olugbemiga Essie Christine, MD  morphine (MS CONTIN) 30 MG 12 hr tablet Take 1 tablet (30 mg total) by mouth every 12 (twelve) hours.  05/31/16  Yes Olugbemiga Essie Christine, MD  oxyCODONE (ROXICODONE) 15 MG immediate release tablet Take 15 mg by mouth 4 (four) times daily. 06/18/16  Yes Historical Provider, MD  rivaroxaban (XARELTO) 20 MG TABS tablet Take 20 mg by mouth daily with supper.   Yes Historical Provider, MD  valACYclovir (VALTREX) 1000 MG tablet Take 1 tablet (1,000 mg total) by mouth daily. Patient taking differently: Take 1,000 mg by mouth daily as needed (cold sores).  05/02/16  Yes [name], MD    Family History Family History  Problem Relation Age of Onset  . Sickle cell trait Mother   . Sickle cell trait Father   . Diabetes Maternal Grandmother   . Diabetes Paternal Grandmother   . Hypertension Paternal Grandmother   . Diabetes Maternal Grandfather     Social History Social History  Substance Use Topics  . Smoking status: Former Smoker    Packs/day: 0.25    Years: 1.00    Types: Cigarettes    Quit date: 03/25/2013  . Smokeless tobacco: Never Used  . Alcohol use No     Comment: pt states she quit marijuan in May 2013. Rare ETOH, + cigarettes.  She is enrolled in school     Allergies   Food and Latex   Review of Systems Review of Systems Ten systems reviewed and are negative for acute change, except as noted in the HPI.    Physical Exam Updated Vital Signs BP 121/56 (BP Location: Left Arm)   Pulse 93   Temp 99.8 F (37.7 C) (Oral)   Resp 18   Ht 5\' 2"  (1.575 m)   Wt 81.6 kg   LMP 06/23/2016   SpO2 96%   BMI 32.92 kg/m   Physical Exam  Constitutional: She is oriented to person, place, and time. She appears well-developed and well-nourished. No distress.  Nontoxic and in NAD  HENT:  Head: Normocephalic and atraumatic.  Eyes: Conjunctivae and EOM are normal. No scleral icterus.  Neck: Normal range of motion.  Cardiovascular: Normal rate, regular rhythm and intact distal pulses.   Pulmonary/Chest: Effort normal. No respiratory distress. She has no wheezes. She has no  rales.  Respirations even and unlabored  Abdominal: Soft. She exhibits no distension and no mass. There is no guarding.  Soft, obese abdomen. No focal TTP. No rigidity or peritoneal signs.  Musculoskeletal: Normal range of motion.  Neurological: She is alert and oriented to person, place, and time. She exhibits normal muscle tone. Coordination normal.  Skin: Skin is warm and dry. No rash noted. She is not diaphoretic. No erythema. No pallor.  Psychiatric: She has a normal mood and affect. Her behavior is normal.  Nursing note and vitals reviewed.    ED Treatments / Results  Labs (all labs ordered are listed, but only abnormal results are displayed) Labs Reviewed  COMPREHENSIVE METABOLIC PANEL - Abnormal; Notable for the following:       Result Value   Glucose, Bld 125 (*)    Total Bilirubin 1.8 (*)    All other components within normal limits  CBC WITH DIFFERENTIAL/PLATELET - Abnormal; Notable for the following:    WBC 13.6 (*)    RBC 2.66 (*)    Hemoglobin 8.5 (*)    HCT 23.9 (*)    RDW 17.9 (*)    Platelets 509 (*)    Neutro Abs 8.2 (*)    All other components within normal limits  RETICULOCYTES - Abnormal; Notable for the following:    Retic Ct Pct 14.4 (*)    RBC. 2.66 (*)    Retic Count, Manual 383.0 (*)    All other components within normal limits  PREGNANCY, URINE    EKG  EKG Interpretation None       Radiology No results found.  Procedures Procedures (including critical care time)  Medications Ordered in ED Medications  HYDROmorphone (DILAUDID) injection 0.5 mg (0.5 mg Subcutaneous Given 06/23/16 2142)  HYDROmorphone (DILAUDID) injection 2 mg (2 mg Intravenous Given 06/24/16 0015)  ketorolac (TORADOL) 30 MG/ML injection 30 mg (30 mg Intravenous Given 06/23/16 2228)  ondansetron (ZOFRAN) injection 4 mg (4 mg Intravenous Given 06/23/16 2225)  diphenhydrAMINE (BENADRYL) injection 25 mg (25 mg Intravenous Given 06/23/16 2228)  sodium chloride 0.9 % bolus 1,000 mL  (0 mLs Intravenous Stopped 06/24/16 0016)     Initial Impression / Assessment and Plan / ED Course  I have reviewed the triage vital signs and the nursing notes.  Pertinent labs & imaging results that were available during my care of the patient were reviewed by me and considered in my medical decision making (see chart for details).      26 year old female presents to the emergency department for evaluation of back pain. She reports daily back pain associated with her sickle cell which waxes and wanes in severity. Patient has been seen multiple times in the emergency department. She is afebrile and denies chest pain. No complaints of shortness of breath or hypoxia. Laboratory workup is consistent with prior visits. The patient has been managed with Toradol as well as 2 mg IV Dilaudid 3. On reassessment, the patient expresses comfort with further management on an outpatient basis. She has morphine tablets at home to take. Return precautions discussed and provided. Patient discharged in stable condition with no unaddressed concerns.   Final Clinical Impressions(s) / ED Diagnoses   Final diagnoses:  Sickle-cell disease with pain [facility])    New Prescriptions New Prescriptions   No medications on file     Antonietta Breach, PA-C 06/24/16 0100    Orlie Dakin, MD 06/24/16 [phone]

## 2016-06-24 DIAGNOSIS — D5700 Hb-SS disease with crisis, unspecified: Secondary | ICD-10-CM | POA: Diagnosis not present

## 2016-06-24 MED ORDER — HEPARIN SOD (PORK) LOCK FLUSH 100 UNIT/ML IV SOLN
500.0000 [IU] | Freq: Once | INTRAVENOUS | Status: AC
  Administered 2016-06-24: 500 [IU]
  Filled 2016-06-24: qty 5

## 2016-06-25 IMAGING — DX DG CHEST 2V
2 series · 2 of 2 positions shown · non-contrast
Comparison: 10/25/2014.

CLINICAL DATA: Hypoxia.

EXAM:
CHEST  2 VIEW

[chest pa]
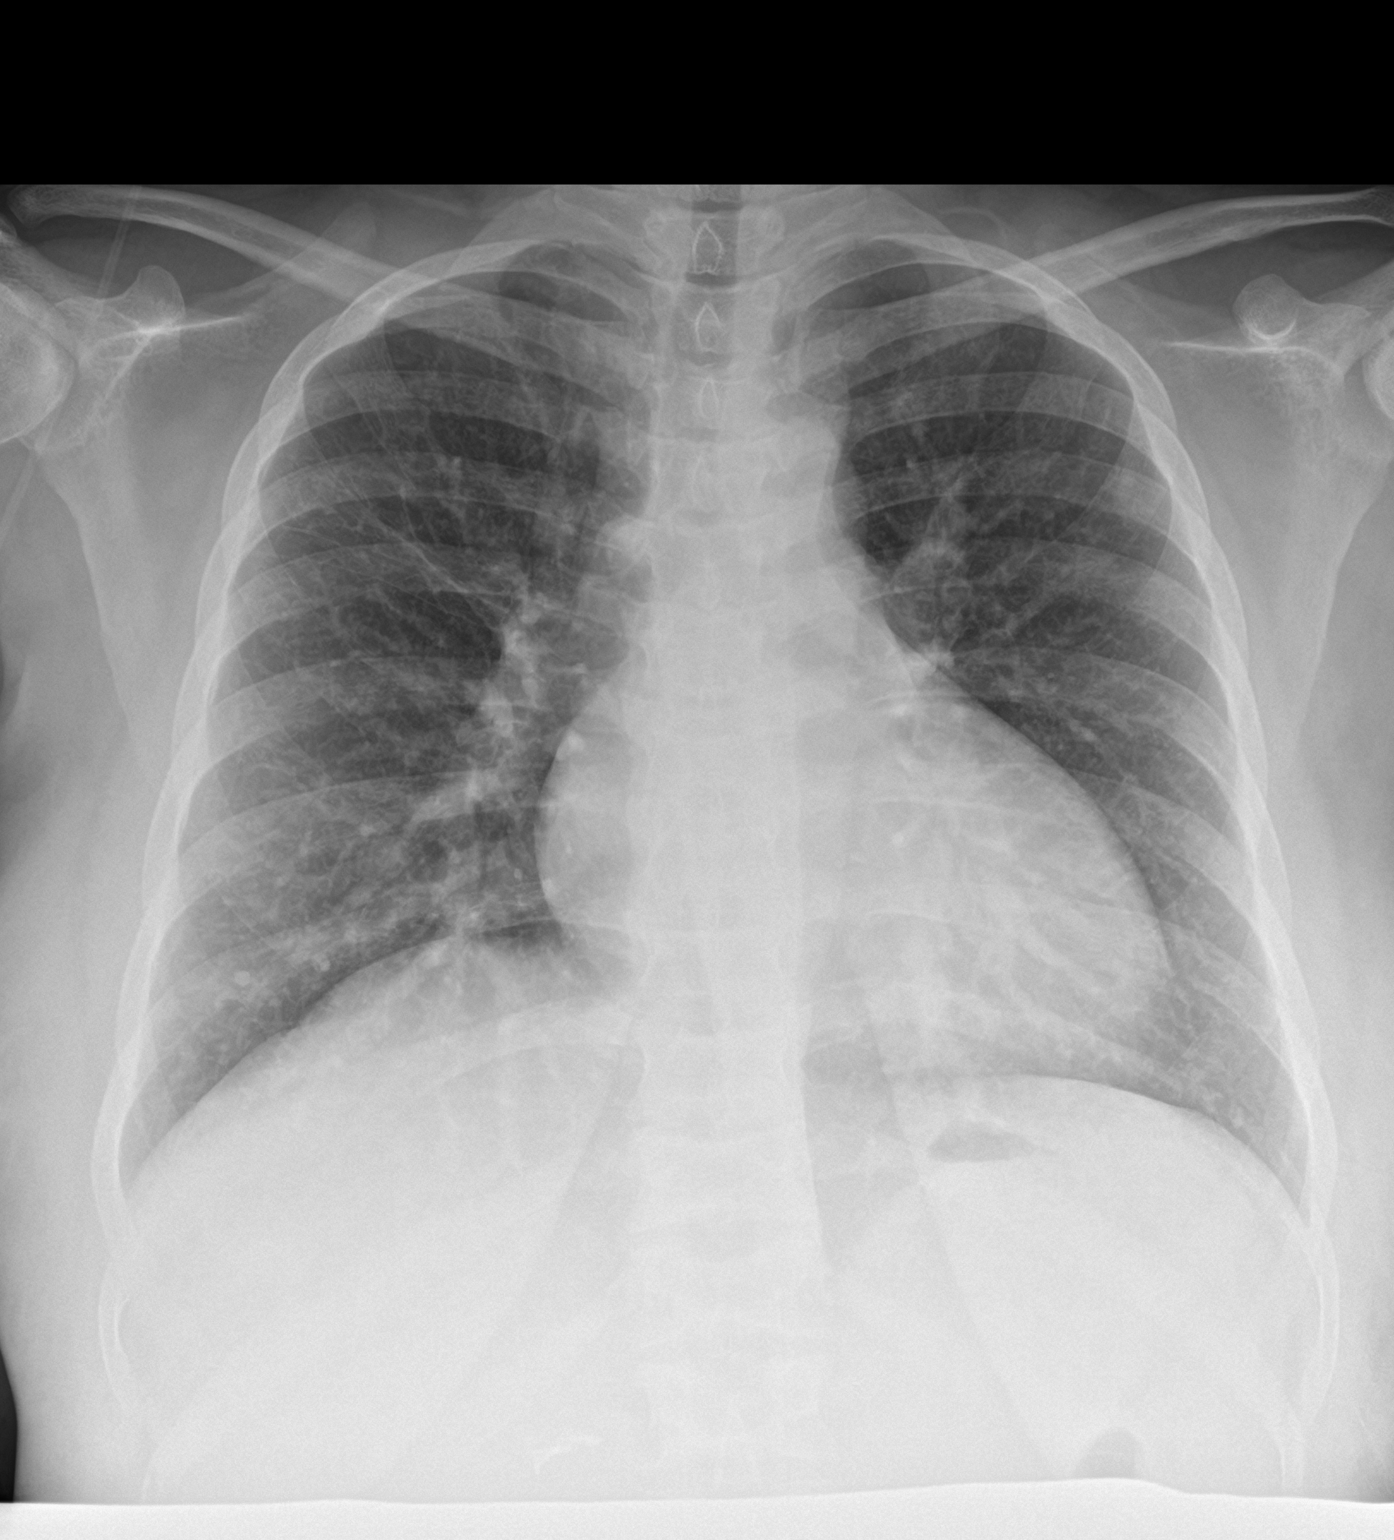

[chest lat]
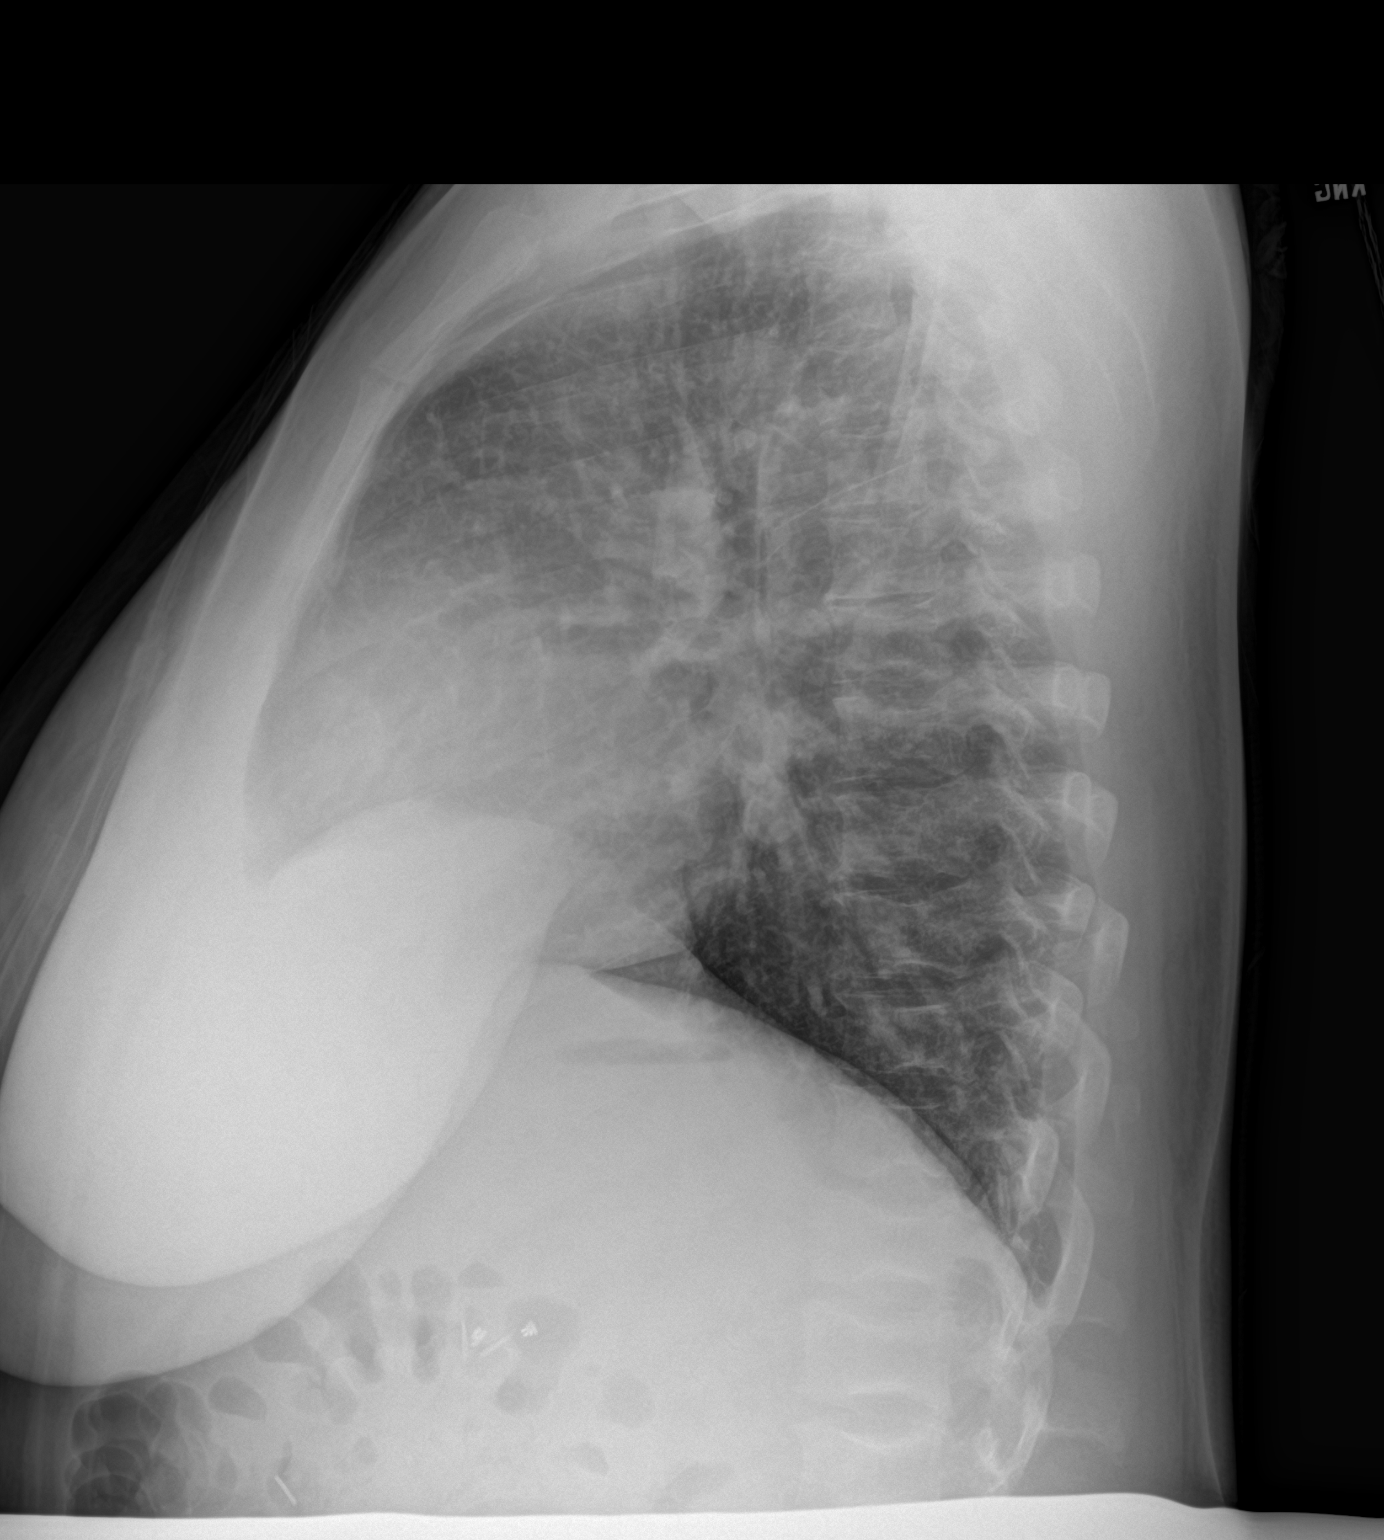

[2 of 2 positions shown; findings below may reference images not displayed]

FINDINGS: No focal infiltrate. Stable cardiomegaly, no pulmonary venous
congestion. Right upper lobe pleural parenchymal thickening noted
consistent with scarring. No interim change. Stable calcified
pulmonary nodules consistent granulomas. No pleural effusion or
pneumothorax. No acute bony abnormality .
IMPRESSION: No acute cardiopulmonary disease. Stable cardiomegaly. Stable
changes of right upper lobe pleural parenchymal scarring and
calcified pulmonary nodules consistent with granulomas.

## 2016-06-26 ENCOUNTER — Emergency Department (HOSPITAL_COMMUNITY): Payer: Medicare Other

## 2016-06-26 ENCOUNTER — Encounter (HOSPITAL_COMMUNITY): Payer: Self-pay

## 2016-06-26 ENCOUNTER — Inpatient Hospital Stay (HOSPITAL_COMMUNITY)
Admission: EM | Admit: 2016-06-26 | Discharge: 2016-07-02 | DRG: 812 | Disposition: A | Discharge status: Home / Self Care | Payer: Medicare Other | Attending: Internal Medicine | Admitting: Internal Medicine

## 2016-06-26 DIAGNOSIS — Z91018 Allergy to other foods: Secondary | ICD-10-CM | POA: Diagnosis not present

## 2016-06-26 DIAGNOSIS — G8929 Other chronic pain: Secondary | ICD-10-CM | POA: Diagnosis not present

## 2016-06-26 DIAGNOSIS — Z8249 Family history of ischemic heart disease and other diseases of the circulatory system: Secondary | ICD-10-CM | POA: Diagnosis not present

## 2016-06-26 DIAGNOSIS — D5700 Hb-SS disease with crisis, unspecified: Secondary | ICD-10-CM | POA: Diagnosis present

## 2016-06-26 DIAGNOSIS — K219 Gastro-esophageal reflux disease without esophagitis: Secondary | ICD-10-CM | POA: Diagnosis not present

## 2016-06-26 DIAGNOSIS — Z91040 Latex allergy status: Secondary | ICD-10-CM | POA: Diagnosis not present

## 2016-06-26 DIAGNOSIS — M542 Cervicalgia: Secondary | ICD-10-CM | POA: Diagnosis not present

## 2016-06-26 DIAGNOSIS — R918 Other nonspecific abnormal finding of lung field: Secondary | ICD-10-CM | POA: Diagnosis present

## 2016-06-26 DIAGNOSIS — K120 Recurrent oral aphthae: Secondary | ICD-10-CM | POA: Diagnosis present

## 2016-06-26 DIAGNOSIS — G43909 Migraine, unspecified, not intractable, without status migrainosus: Secondary | ICD-10-CM | POA: Diagnosis present

## 2016-06-26 DIAGNOSIS — Z79891 Long term (current) use of opiate analgesic: Secondary | ICD-10-CM | POA: Diagnosis not present

## 2016-06-26 DIAGNOSIS — M545 Low back pain: Secondary | ICD-10-CM | POA: Diagnosis not present

## 2016-06-26 DIAGNOSIS — Z79899 Other long term (current) drug therapy: Secondary | ICD-10-CM | POA: Diagnosis not present

## 2016-06-26 DIAGNOSIS — Z833 Family history of diabetes mellitus: Secondary | ICD-10-CM | POA: Diagnosis not present

## 2016-06-26 DIAGNOSIS — D62 Acute posthemorrhagic anemia: Secondary | ICD-10-CM | POA: Diagnosis not present

## 2016-06-26 DIAGNOSIS — G894 Chronic pain syndrome: Secondary | ICD-10-CM | POA: Diagnosis not present

## 2016-06-26 DIAGNOSIS — R221 Localized swelling, mass and lump, neck: Secondary | ICD-10-CM | POA: Diagnosis not present

## 2016-06-26 DIAGNOSIS — D638 Anemia in other chronic diseases classified elsewhere: Secondary | ICD-10-CM | POA: Diagnosis present

## 2016-06-26 DIAGNOSIS — Z7901 Long term (current) use of anticoagulants: Secondary | ICD-10-CM | POA: Diagnosis not present

## 2016-06-26 DIAGNOSIS — Z832 Family history of diseases of the blood and blood-forming organs and certain disorders involving the immune mechanism: Secondary | ICD-10-CM | POA: Diagnosis not present

## 2016-06-26 DIAGNOSIS — Z6832 Body mass index (BMI) 32.0-32.9, adult: Secondary | ICD-10-CM | POA: Diagnosis not present

## 2016-06-26 DIAGNOSIS — Z9049 Acquired absence of other specified parts of digestive tract: Secondary | ICD-10-CM | POA: Diagnosis not present

## 2016-06-26 DIAGNOSIS — F329 Major depressive disorder, single episode, unspecified: Secondary | ICD-10-CM | POA: Diagnosis present

## 2016-06-26 DIAGNOSIS — Z87891 Personal history of nicotine dependence: Secondary | ICD-10-CM | POA: Diagnosis not present

## 2016-06-26 DIAGNOSIS — N920 Excessive and frequent menstruation with regular cycle: Secondary | ICD-10-CM | POA: Diagnosis not present

## 2016-06-26 DIAGNOSIS — I82622 Acute embolism and thrombosis of deep veins of left upper extremity: Secondary | ICD-10-CM | POA: Diagnosis present

## 2016-06-26 LAB — COMPREHENSIVE METABOLIC PANEL
ALT: 15 U/L (ref 14–54)
AST: 18 U/L (ref 15–41)
Albumin: 4.4 g/dL (ref 3.5–5.0)
Alkaline Phosphatase: 48 U/L (ref 38–126)
Anion gap: 7 (ref 5–15)
BUN: 11 mg/dL (ref 6–20)
CO2: 23 mmol/L (ref 22–32)
Calcium: 8.7 mg/dL — ABNORMAL LOW (ref 8.9–10.3)
Chloride: 109 mmol/L (ref 101–111)
Creatinine, Ser: 1.05 mg/dL — ABNORMAL HIGH (ref 0.44–1.00)
GFR calc Af Amer: >60 mL/min (ref >60)
GFR calc non Af Amer: >60 mL/min (ref >60)
Glucose, Bld: 99 mg/dL (ref 65–99)
Potassium: 3.5 mmol/L (ref 3.5–5.1)
Sodium: 139 mmol/L (ref 135–145)
Total Bilirubin: 1.6 mg/dL — ABNORMAL HIGH (ref 0.3–1.2)
Total Protein: 7.2 g/dL (ref 6.5–8.1)

## 2016-06-26 LAB — CBC WITH DIFFERENTIAL/PLATELET
Basophils Absolute: 0.1 10*3/uL (ref 0.0–0.1)
Basophils Relative: 1 %
Eosinophils Absolute: 0.6 10*3/uL (ref 0.0–0.7)
Eosinophils Relative: 5 %
HCT: 23.4 % — ABNORMAL LOW (ref 36.0–46.0)
Hemoglobin: 8 g/dL — ABNORMAL LOW (ref 12.0–15.0)
Lymphocytes Relative: 35 %
Lymphs Abs: 3.8 10*3/uL (ref 0.7–4.0)
MCH: 30.4 pg (ref 26.0–34.0)
MCHC: 34.2 g/dL (ref 30.0–36.0)
MCV: 89 fL (ref 78.0–100.0)
Monocytes Absolute: 1.3 10*3/uL — ABNORMAL HIGH (ref 0.1–1.0)
Monocytes Relative: 12 %
Neutro Abs: 5.3 10*3/uL (ref 1.7–7.7)
Neutrophils Relative %: 47 %
Platelets: 520 10*3/uL — ABNORMAL HIGH (ref 150–400)
RBC: 2.63 MIL/uL — ABNORMAL LOW (ref 3.87–5.11)
RDW: 17.7 % — ABNORMAL HIGH (ref 11.5–15.5)
WBC: 11 10*3/uL — ABNORMAL HIGH (ref 4.0–10.5)

## 2016-06-26 LAB — URINALYSIS, ROUTINE W REFLEX MICROSCOPIC
BILIRUBIN URINE: NEGATIVE
Glucose, UA: NEGATIVE mg/dL
HGB URINE DIPSTICK: NEGATIVE
Ketones, ur: NEGATIVE mg/dL
NITRITE: NEGATIVE
PROTEIN: NEGATIVE mg/dL
Specific Gravity, Urine: 1.006 (ref 1.005–1.030)
pH: 6 (ref 5.0–8.0)

## 2016-06-26 LAB — POC URINE PREG, ED: PREG TEST UR: NEGATIVE

## 2016-06-26 LAB — RETICULOCYTES
RBC.: 2.63 MIL/uL — ABNORMAL LOW (ref 3.87–5.11)
Retic Count, Absolute: 365.6 10*3/uL — ABNORMAL HIGH (ref 19.0–186.0)
Retic Ct Pct: 13.9 % — ABNORMAL HIGH (ref 0.4–3.1)

## 2016-06-26 MED ORDER — ONDANSETRON HCL 4 MG/2ML IJ SOLN
4.0000 mg | Freq: Four times a day (QID) | INTRAMUSCULAR | Status: DC | PRN
  Administered 2016-06-30: 4 mg via INTRAVENOUS
  Filled 2016-06-26: qty 2

## 2016-06-26 MED ORDER — DIPHENHYDRAMINE HCL 50 MG/ML IJ SOLN
25.0000 mg | Freq: Once | INTRAMUSCULAR | Status: AC
  Administered 2016-06-26: 25 mg via INTRAVENOUS
  Filled 2016-06-26: qty 1

## 2016-06-26 MED ORDER — SENNOSIDES-DOCUSATE SODIUM 8.6-50 MG PO TABS
1.0000 | ORAL_TABLET | Freq: Two times a day (BID) | ORAL | Status: DC
  Administered 2016-06-27 – 2016-07-02 (×9): 1 via ORAL
  Filled 2016-06-26 (×10): qty 1

## 2016-06-26 MED ORDER — SODIUM CHLORIDE 0.9 % IV SOLN
25.0000 mg | INTRAVENOUS | Status: DC | PRN
  Filled 2016-06-26: qty 0.5

## 2016-06-26 MED ORDER — HYDROMORPHONE HCL 2 MG/ML IJ SOLN: 2.0000 mg | INTRAMUSCULAR | Status: AC

## 2016-06-26 MED ORDER — HYDROMORPHONE HCL 2 MG/ML IJ SOLN
2.0000 mg | INTRAMUSCULAR | Status: AC
  Administered 2016-06-26: 2 mg via INTRAVENOUS

## 2016-06-26 MED ORDER — VALACYCLOVIR HCL 500 MG PO TABS
1000.0000 mg | ORAL_TABLET | Freq: Every day | ORAL | Status: DC | PRN
  Filled 2016-06-26: qty 2

## 2016-06-26 MED ORDER — HYDROMORPHONE HCL 2 MG/ML IJ SOLN: 2.0000 mg | INTRAMUSCULAR | Status: DC

## 2016-06-26 MED ORDER — MORPHINE SULFATE ER 30 MG PO TBCR
30.0000 mg | EXTENDED_RELEASE_TABLET | Freq: Two times a day (BID) | ORAL | Status: DC
  Administered 2016-06-26 – 2016-07-02 (×12): 30 mg via ORAL
  Filled 2016-06-26 (×12): qty 1

## 2016-06-26 MED ORDER — HYDROMORPHONE HCL 2 MG/ML IJ SOLN
2.0000 mg | INTRAMUSCULAR | Status: AC
  Administered 2016-06-26: 2 mg via INTRAVENOUS
  Filled 2016-06-26 (×2): qty 1

## 2016-06-26 MED ORDER — HYDROMORPHONE 1 MG/ML IV SOLN
INTRAVENOUS | Status: DC
  Administered 2016-06-26: via INTRAVENOUS
  Administered 2016-06-27: 11.2 mg via INTRAVENOUS
  Administered 2016-06-27: 10.9 mg via INTRAVENOUS
  Administered 2016-06-27: 0.7 mg via INTRAVENOUS
  Administered 2016-06-27: 2.8 mg via INTRAVENOUS
  Administered 2016-06-27: 4.2 mg via INTRAVENOUS
  Administered 2016-06-27: 14:00:00 via INTRAVENOUS
  Administered 2016-06-27: 9.8 mg via INTRAVENOUS
  Administered 2016-06-27: 4.9 mg via INTRAVENOUS
  Administered 2016-06-28: 5.4 mg via INTRAVENOUS
  Administered 2016-06-28: 11.2 mg via INTRAVENOUS
  Administered 2016-06-28: 4.2 mg via INTRAVENOUS
  Administered 2016-06-28 (×2): 5.6 mg via INTRAVENOUS
  Administered 2016-06-28: 1 mg via INTRAVENOUS
  Administered 2016-06-28: 10.5 mg via INTRAVENOUS
  Administered 2016-06-28: 16:00:00 via INTRAVENOUS
  Administered 2016-06-29: 25 mg via INTRAVENOUS
  Administered 2016-06-29: 0.7 mg via INTRAVENOUS
  Administered 2016-06-29: 11:00:00 via INTRAVENOUS
  Administered 2016-06-29: 2.8 mg via INTRAVENOUS
  Administered 2016-06-29: 7.7 mg via INTRAVENOUS
  Administered 2016-06-29: 11 mg via INTRAVENOUS
  Administered 2016-06-29: 2.8 mg via INTRAVENOUS
  Administered 2016-06-29: 9.1 mg via INTRAVENOUS
  Administered 2016-06-30 (×2): 7 mg via INTRAVENOUS
  Administered 2016-06-30: 5.6 mg via INTRAVENOUS
  Administered 2016-06-30: 3.5 mg via INTRAVENOUS
  Administered 2016-06-30 – 2016-07-01 (×2): via INTRAVENOUS
  Administered 2016-07-01: 13.3 mg via INTRAVENOUS
  Administered 2016-07-01: 2.1 mg via INTRAVENOUS
  Administered 2016-07-01: 8.4 mg via INTRAVENOUS
  Administered 2016-07-01: 9.1 mg via INTRAVENOUS
  Administered 2016-07-01: 18:00:00 via INTRAVENOUS
  Administered 2016-07-01: 1.4 mg via INTRAVENOUS
  Administered 2016-07-02: 8.4 mg via INTRAVENOUS
  Administered 2016-07-02: 12.6 mg via INTRAVENOUS
  Administered 2016-07-02: 03:00:00 via INTRAVENOUS
  Administered 2016-07-02: 2.8 mg via INTRAVENOUS
  Filled 2016-06-26 (×10): qty 25

## 2016-06-26 MED ORDER — POLYETHYLENE GLYCOL 3350 17 G PO PACK
17.0000 g | PACK | Freq: Every day | ORAL | Status: DC | PRN
  Administered 2016-06-29: 17 g via ORAL
  Filled 2016-06-26: qty 1

## 2016-06-26 MED ORDER — SODIUM CHLORIDE 0.9% FLUSH: 9.0000 mL | INTRAVENOUS | Status: DC | PRN

## 2016-06-26 MED ORDER — KETOROLAC TROMETHAMINE 30 MG/ML IJ SOLN
30.0000 mg | Freq: Four times a day (QID) | INTRAMUSCULAR | Status: AC
  Administered 2016-06-26 – 2016-07-01 (×19): 30 mg via INTRAVENOUS
  Filled 2016-06-26 (×19): qty 1

## 2016-06-26 MED ORDER — AZITHROMYCIN 250 MG PO TABS
250.0000 mg | ORAL_TABLET | Freq: Every day | ORAL | Status: DC
  Administered 2016-06-27 – 2016-06-28 (×2): 250 mg via ORAL
  Filled 2016-06-26 (×2): qty 1

## 2016-06-26 MED ORDER — HYDROMORPHONE HCL 2 MG/ML IJ SOLN
2.0000 mg | INTRAMUSCULAR | Status: DC
Start: 2016-06-26 — End: 2016-06-27
  Administered 2016-06-26: 2 mg via INTRAVENOUS
  Filled 2016-06-26: qty 1

## 2016-06-26 MED ORDER — NALOXONE HCL 0.4 MG/ML IJ SOLN: 0.4000 mg | INTRAMUSCULAR | Status: DC | PRN

## 2016-06-26 MED ORDER — DIPHENHYDRAMINE HCL 25 MG PO CAPS
25.0000 mg | ORAL_CAPSULE | ORAL | Status: DC | PRN
  Administered 2016-06-26 – 2016-06-27 (×2): 50 mg via ORAL
  Administered 2016-06-28 – 2016-06-29 (×3): 25 mg via ORAL
  Administered 2016-06-30 – 2016-07-02 (×6): 50 mg via ORAL
  Filled 2016-06-26: qty 2
  Filled 2016-06-26: qty 1
  Filled 2016-06-26 (×7): qty 2
  Filled 2016-06-26: qty 1
  Filled 2016-06-26: qty 2

## 2016-06-26 MED ORDER — AZITHROMYCIN 250 MG PO TABS
500.0000 mg | ORAL_TABLET | Freq: Every day | ORAL | Status: AC
  Administered 2016-06-26: 500 mg via ORAL
  Filled 2016-06-26: qty 2

## 2016-06-26 MED ORDER — SODIUM CHLORIDE 0.45 % IV SOLN
INTRAVENOUS | Status: AC
  Administered 2016-06-26 – 2016-06-27 (×3): via INTRAVENOUS

## 2016-06-26 MED ORDER — HYDROMORPHONE HCL 2 MG/ML IJ SOLN
2.0000 mg | INTRAMUSCULAR | Status: AC
  Administered 2016-06-26: 2 mg via INTRAVENOUS
  Filled 2016-06-26: qty 1

## 2016-06-26 MED ORDER — HYDROXYUREA 500 MG PO CAPS
1000.0000 mg | ORAL_CAPSULE | Freq: Every day | ORAL | Status: DC
  Administered 2016-06-27 – 2016-07-02 (×6): 1000 mg via ORAL
  Filled 2016-06-26 (×6): qty 2

## 2016-06-26 MED ORDER — RIVAROXABAN 20 MG PO TABS
20.0000 mg | ORAL_TABLET | Freq: Every day | ORAL | Status: DC
  Administered 2016-06-27 – 2016-07-02 (×5): 20 mg via ORAL
  Filled 2016-06-26 (×6): qty 1

## 2016-06-26 NOTE — ED Triage Notes (Signed)
PT C/O BACK PAIN DUE TO SICKLE CELL DISEASE. PT ALSO C/O A SHARPER PAIN THE LEFT-SIDE OF HER BACK THAT STARTED WHEN SHE WOKE UP THIS MORNING. PT DENIES INJURY, BUT STS THIS PAIN IS DIFFERENT FROM HER SS PAIN. SHE DID TAKE HER PAIN MEDICATION W/O RELIEF. PT STS SHE HAS A BLOOD CLOT IN THE LEFT ARM AND IS TAKING XARELTO.

## 2016-06-26 NOTE — ED Provider Notes (Signed)
[facility] DEPT Provider Note   CSN: [id number] Arrival date & time: 06/26/16  1818     History   Chief Complaint Chief Complaint  Patient presents with  . Sickle Cell Pain Crisis  . Back Pain    HPI [name] is a 26 y.o. female.  HPI   [name] is a 26 y.o. female, with a history of Sickle cell anemia, presenting to the ED with back pain for the past week. Pt complains of lower back pain that is consistent with her sickle cell pain. She also complains of aching/sharp 10/10 pain that extends from her left lower back to her left flank, following her left ribs. This pain also feels like sickle cell pain. Last oxycodone 15 mg was at 3pm today. Last MS Contin was about 11 am today.   Pt states she has been on her menstrual cycle, bleeds heavily, and adds that this can bring on sickle cell pain. She has been changing her tampon/pad every 4 hours.  Pt adds that she had a left UE DVT that was found about 3 weeks ago and is still compliant with her Xarelto.   Denies cough, SOB, fever/chills, N/V/D, dizziness, or any other complaints.    Past Medical History:  Diagnosis Date  . Blood transfusion    "lots"  . Blood transfusion without reported diagnosis   . Chronic back pain    "very severe; have knot in my back; from tight muscle; take RX and exercise for it"  . Depression 01/06/2011  . Exertional dyspnea    "sometimes"  . Genital HSV   . GERD (gastroesophageal reflux disease) 02/17/2011  . Migraines 11/08/11   "@ least twice/month"  . 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  . Mood swings ([place]) 11/08/11   "I go back and forth; real bad"  . Sickle cell anemia ([name])   . Sickle cell anemia with crisis ([place])   . Trichotillomania    h/o    Patient Active Problem List   Diagnosis Date Noted  . Chronic anticoagulation   . Sickle cell anemia ([place]) 05/19/2016  . History of DVT (deep vein thrombosis) 04/17/2016  . Post partum depression 04/03/2016   . Episode of recurrent major depressive disorder ([place])   . Sickle cell anemia with pain ([place]) 02/16/2016  . Thrombocytosis ([place]) 11/09/2015  . Hyperbilirubinemia 11/09/2015  . Narcotic dependence ([place]) 10/06/2015  . 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 High risk for intrapartum complications, antepartum 08/12/2015  . Chronic pain 08/04/2015  . Cocaine use 07/24/2015  . Herpes simplex 07/14/2015  . Anemia of chronic disease   . 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 Sickle cell disease ([place]) 01/08/2009  . 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 CHOLECYSTECTOMY  05/2010  . DILATION AND CURETTAGE OF UTERUS  02/20/11   S/P miscarriage  . IR GENERIC HISTORICAL  12/23/2015   IR FLUORO GUIDE CV LINE RIGHT 12/23/2015 Jacqulynn Cadet, MD [facility]  . IR GENERIC HISTORICAL  12/23/2015   IR US GUIDE VASC ACCESS RIGHT 12/23/2015 Jacqulynn Cadet, MD [facility]    OB History    Gravida Para Term Preterm AB Living   3 2 2   1 2    SAB TAB Ectopic Multiple Live Births   1     0 2      Obstetric Comments   Miscarried in October 2012  at about 7 weeks       Home Medications    Prior to Admission medications   Medication Sig Start Date End Date Taking? Authorizing Provider  aspirin-acetaminophen-caffeine (EXCEDRIN MIGRAINE) [phone] MG tablet Take 2 tablets by mouth every 6 (six) hours as needed for headache.    Yes Historical Provider, MD  hydroxyurea (HYDREA) 500 MG capsule Take 2 capsules (1,000 mg total) by mouth daily. May take with food to minimize GI side effects. 04/18/16  Yes Tresa Garter, MD  ibuprofen (ADVIL,MOTRIN) 600 MG tablet Take 1 tablet (600 mg total) by mouth every 6 (six) hours as needed for mild pain or moderate pain. 03/14/16  Yes Olugbemiga Essie Christine, MD  morphine (MS CONTIN) 30 MG 12 hr tablet Take 1 tablet (30 mg total) by mouth every 12 (twelve)  hours. 05/31/16  Yes Olugbemiga Essie Christine, MD  oxyCODONE (ROXICODONE) 15 MG immediate release tablet Take 15 mg by mouth 4 (four) times daily. 06/18/16  Yes Historical Provider, MD  rivaroxaban (XARELTO) 20 MG TABS tablet Take 20 mg by mouth daily.    Yes Historical Provider, MD  valACYclovir (VALTREX) 1000 MG tablet Take 1 tablet (1,000 mg total) by mouth daily. Patient taking differently: Take 1,000 mg by mouth daily as needed (cold sores).  05/02/16  Yes [name], MD    Family History Family History  Problem Relation Age of Onset  . Sickle cell trait Mother   . Sickle cell trait Father   . Diabetes Maternal Grandmother   . Diabetes Paternal Grandmother   . Hypertension Paternal Grandmother   . Diabetes Maternal Grandfather     Social History Social History  Substance Use Topics  . Smoking status: Former Smoker    Packs/day: 0.25    Years: 1.00    Types: Cigarettes    Quit date: 03/25/2013  . Smokeless tobacco: Never Used  . Alcohol use No     Comment: pt states she quit marijuan in May 2013. Rare ETOH, + cigarettes.  She is enrolled in school     Allergies   Food and Latex   Review of Systems Review of Systems  Constitutional: Negative for chills and fever.  Respiratory: Negative for cough and shortness of breath.   Cardiovascular: Negative for chest pain.  Gastrointestinal: Negative for abdominal pain, diarrhea, nausea and vomiting.  Musculoskeletal: Positive for back pain.       Left rib tenderness  Skin: Negative for rash.  Neurological: Negative for dizziness, weakness, light-headedness, numbness and headaches.  All other systems reviewed and are negative.    Physical Exam Updated Vital Signs BP 105/68   Pulse 94   Resp 14   Ht 5\' 2"  (1.575 m)   Wt 81.6 kg   LMP 06/23/2016   SpO2 99%   BMI 32.92 kg/m   Physical Exam  Constitutional: She appears well-developed and well-nourished. No distress.  HENT:  Head: Normocephalic and atraumatic.    Eyes: Conjunctivae are normal.  Neck: Neck supple.  Cardiovascular: Normal rate, regular rhythm, normal heart sounds and intact distal pulses.   Pulmonary/Chest: Effort normal and breath sounds normal. No respiratory distress.  No noted increased work of breathing. Pt speaks in full sentences without difficulty.   Abdominal: Soft. There is no tenderness. There is no guarding.  Musculoskeletal: She exhibits tenderness. She exhibits no edema.  Tenderness to left thoracic back in the region of ribs 6-9, extending around to the left flank, and into the anterior left ribs.  Tenderness to the bilateral lumbar musculature.   Lymphadenopathy:    She has no cervical adenopathy.  Neurological: She is alert.  Skin: Skin is warm and dry. She is not diaphoretic.  Psychiatric: She has a normal mood and affect. Her behavior is normal.  Nursing note and vitals reviewed.    ED Treatments / Results  Labs (all labs ordered are listed, but only abnormal results are displayed) Labs Reviewed  COMPREHENSIVE METABOLIC PANEL - Abnormal; Notable for the following:       Result Value   Creatinine, Ser 1.05 (*)    Calcium 8.7 (*)    Total Bilirubin 1.6 (*)    All other components within normal limits  CBC WITH DIFFERENTIAL/PLATELET - Abnormal; Notable for the following:    WBC 11.0 (*)    RBC 2.63 (*)    Hemoglobin 8.0 (*)    HCT 23.4 (*)    RDW 17.7 (*)    Platelets 520 (*)    Monocytes Absolute 1.3 (*)    All other components within normal limits  RETICULOCYTES - Abnormal; Notable for the following:    Retic Ct Pct 13.9 (*)    RBC. 2.63 (*)    Retic Count, Manual 365.6 (*)    All other components within normal limits  URINALYSIS, ROUTINE W REFLEX MICROSCOPIC - Abnormal; Notable for the following:    Color, Urine STRAW (*)    Leukocytes, UA TRACE (*)    Bacteria, UA RARE (*)    Squamous Epithelial / LPF 0-5 (*)    All other components within normal limits  POC URINE PREG, ED   Hemoglobin   Date Value Ref Range Status  06/26/2016 8.0 (L) 12.0 - 15.0 g/dL Final  06/23/2016 8.5 (L) 12.0 - 15.0 g/dL Final  06/14/2016 6.9 (LL) 12.0 - 15.0 g/dL Final    Comment:    REPEATED TO VERIFY CRITICAL RESULT CALLED TO, READ BACK BY AND VERIFIED WITH: KAUR,B. RN AT 1145 06/14/16 COHEN,K   06/12/2016 7.3 (L) 12.0 - 15.0 g/dL Final   HGB  Date Value Ref Range Status  05/11/2012 8.7 (L) 12.0 - 16.0 g/dL Final  05/10/2012 8.8 (L) 12.0 - 16.0 g/dL Final  05/09/2012 8.2 (L) 12.0 - 16.0 g/dL Final  05/08/2012 7.9 (L) 12.0 - 16.0 g/dL Final   BUN  Date Value Ref Range Status  06/26/2016 11 6 - 20 mg/dL Final  06/23/2016 12 6 - 20 mg/dL Final  06/14/2016 8 6 - 20 mg/dL Final  06/11/2016 9 6 - 20 mg/dL Final  05/06/2012 9 7 - 18 mg/dL Final   Creat  Date Value Ref Range Status  07/06/2015 0.43 (L) 0.50 - 1.10 mg/dL Final  05/29/2015 0.43 (L) 0.50 - 1.10 mg/dL Final  04/23/2014 0.83 0.50 - 1.10 mg/dL Final  03/10/2014 0.60 0.50 - 1.10 mg/dL Final   Creatinine, Ser  Date Value Ref Range Status  06/26/2016 1.05 (H) 0.44 - 1.00 mg/dL Final  06/23/2016 0.72 0.44 - 1.00 mg/dL Final  06/14/2016 0.55 0.44 - 1.00 mg/dL Final  06/11/2016 0.53 0.44 - 1.00 mg/dL Final     EKG  EKG Interpretation None       Radiology Dg Chest 2 View  Result Date: 06/26/2016 CLINICAL DATA:  Chest soreness for 1 day.  Sickle cell anemia. EXAM: CHEST  2 VIEW COMPARISON:  June 10, 2016 FINDINGS: Bony sclerosis, most prominent in the humeral head stomach consistent with known sickle cell. No pneumothorax. The heart size is mildly enlarged. The  hila and mediastinum are normal. Mild opacity in the right mid lung not seen previously. No other acute pulmonary abnormalities. Right Port-A-Cath is in good position. IMPRESSION: 1. Right mid lung infiltrate.  Recommend follow-up to resolution. 2. Sickle cell changes to the bones. Electronically Signed   By: Dorise Bullion III M.D   On: 06/26/2016 20:19     Procedures Procedures (including critical care time)  Medications Ordered in ED Medications  0.45 % sodium chloride infusion ( Intravenous New Bag/Given 06/26/16 1938)  HYDROmorphone (DILAUDID) injection 2 mg (2 mg Intravenous Given 06/26/16 2129)    Or  HYDROmorphone (DILAUDID) injection 2 mg ( Subcutaneous See Alternative 06/26/16 2129)  azithromycin (ZITHROMAX) tablet 500 mg (not administered)    Followed by  azithromycin (ZITHROMAX) tablet 250 mg (not administered)  naloxone (NARCAN) injection 0.4 mg (not administered)    And  sodium chloride flush (NS) 0.9 % injection 9 mL (not administered)  ondansetron (ZOFRAN) injection 4 mg (not administered)  diphenhydrAMINE (BENADRYL) capsule 25-50 mg (not administered)    Or  diphenhydrAMINE (BENADRYL) 25 mg in sodium chloride 0.9 % 50 mL IVPB (not administered)  HYDROmorphone (DILAUDID) 1 mg/mL PCA injection (not administered)  HYDROmorphone (DILAUDID) injection 2 mg (2 mg Intravenous Given 06/26/16 2051)    Or  HYDROmorphone (DILAUDID) injection 2 mg ( Subcutaneous See Alternative 06/26/16 2051)  HYDROmorphone (DILAUDID) injection 2 mg (2 mg Intravenous Given 06/26/16 2016)    Or  HYDROmorphone (DILAUDID) injection 2 mg ( Subcutaneous See Alternative 06/26/16 2016)  HYDROmorphone (DILAUDID) injection 2 mg (2 mg Intravenous Given 06/26/16 1939)    Or  HYDROmorphone (DILAUDID) injection 2 mg ( Subcutaneous See Alternative 06/26/16 1939)  diphenhydrAMINE (BENADRYL) injection 25 mg (25 mg Intravenous Given 06/26/16 1939)  diphenhydrAMINE (BENADRYL) injection 25 mg (25 mg Intravenous Given 06/26/16 2114)     Initial Impression / Assessment and Plan / ED Course  I have reviewed the triage vital signs and the nursing notes.  Pertinent labs & imaging results that were available during my care of the patient were reviewed by me and considered in my medical decision making (see chart for details).     Patient presents with pain consistent with  her sickle cell pain. Possible developing infiltrate in the right lung noted on chest x-ray. Though the patient denies cough or fever, her sickle cell makes her high risk and therefore this chest x-ray finding will likely need to be treated.  Patient is nontoxic appearing, afebrile, not tachycardic, not tachypneic, not hypotensive, maintains SPO2 of 99% on room air, and is in no apparent distress. Patient has no signs of sepsis or other serious or life-threatening condition. This patient does not present as having an acute chest.  After 3 rounds of Dilaudid, patient still maintains that she is in 8/10 pain. Continues to deny shortness of breath. Admission for continued pain management.  9:45 PM Spoke with Dr. Alcario Drought, hospitalist, who agreed to admit the patient.   Findings and plan of care discussed with Virgel Manifold, MD.    Vitals:   06/26/16 1930 06/26/16 1941 06/26/16 1942 06/26/16 2144  BP: 124/84   131/83  Pulse: 88   105  Resp: 19   18  Temp:   99.1 F (37.3 C)   TempSrc:   Oral   SpO2: 99%   97%  Weight:  82.6 kg    Height:         Final Clinical Impressions(s) / ED Diagnoses   Final diagnoses:  Sickle  cell pain crisis [facility])    New Prescriptions New Prescriptions   No medications on file     Layla Maw 06/26/16 2151    Virgel Manifold, MD 07/04/16 [phone]

## 2016-06-26 NOTE — ED Notes (Signed)
Pt asked to please not push buttons on IV pump. She stated that she understood.

## 2016-06-26 NOTE — ED Notes (Signed)
ED Provider at bedside. 

## 2016-06-26 NOTE — H&P (Signed)
History and Physical    [name] [id number] DOB: April 05, 1991 DOA: 06/26/2016   PCP: Angelica Chessman, MD Chief Complaint:  Chief Complaint  Patient presents with  . Sickle Cell Pain Crisis  . Back Pain    HPI: [name] is a 26 y.o. female with medical history significant of HGB-SS disease requiring frequent admissions.  Patient presents to the ED with c/o back and flank pain.  Back pain is typical of sickle cell pain crisis.  Pain is aching and sharp.  Pain is constant.  Last oxy was at 3pm today last MS contin at 11am today.  Patient also adds that she has heavy bleeding from menstruation and this usually brings on her sickle cell pain.  She has mild SOB, no cough, no fever, no chills.  ED Course: HGB 8, CXR shows RML infiltrate.  WBC 11k, Tm 99.x, Satting 99% on room air.  Review of Systems: As per HPI otherwise 10 point review of systems negative.    Past Medical History:  Diagnosis Date  . Blood transfusion    "lots"  . Blood transfusion without reported diagnosis   . Chronic back pain    "very severe; have knot in my back; from tight muscle; take RX and exercise for it"  . Depression 01/06/2011  . Exertional dyspnea    "sometimes"  . Genital HSV   . GERD (gastroesophageal reflux disease) 02/17/2011  . Migraines 11/08/11   "@ least twice/month"  . 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  . Mood swings ([place]) 11/08/11   "I go back and forth; real bad"  . Sickle cell anemia ([name])   . Sickle cell anemia with crisis ([place])   . Trichotillomania    h/o    Past Surgical History:  Procedure Laterality Date  . CHOLECYSTECTOMY  05/2010  . DILATION AND CURETTAGE OF UTERUS  02/20/11   S/P miscarriage  . IR GENERIC HISTORICAL  12/23/2015   IR FLUORO GUIDE CV LINE RIGHT 12/23/2015 Jacqulynn Cadet, MD [facility]  . IR GENERIC HISTORICAL  12/23/2015   IR US GUIDE VASC ACCESS RIGHT 12/23/2015 Jacqulynn Cadet, MD [facility]     reports that she  quit smoking about 3 years ago. Her smoking use included Cigarettes. She has a 0.25 pack-year smoking history. She has never used smokeless tobacco. She reports that she uses drugs, including Other-see comments and Cocaine. She reports that she does not drink alcohol.  Allergies  Allergen Reactions  . Food Hives and Other (See Comments)    Pt states that she is allergic to carrots.   . Latex Rash    Family History  Problem Relation Age of Onset  . Sickle cell trait Mother   . Sickle cell trait Father   . Diabetes Maternal Grandmother   . Diabetes Paternal Grandmother   . Hypertension Paternal Grandmother   . Diabetes Maternal Grandfather       Prior to Admission medications   Medication Sig Start Date End Date Taking? Authorizing Provider  aspirin-acetaminophen-caffeine (EXCEDRIN MIGRAINE) [phone] MG tablet Take 2 tablets by mouth every 6 (six) hours as needed for headache.    Yes Historical Provider, MD  hydroxyurea (HYDREA) 500 MG capsule Take 2 capsules (1,000 mg total) by mouth daily. May take with food to minimize GI side effects. 04/18/16  Yes Tresa Garter, MD  ibuprofen (ADVIL,MOTRIN) 600 MG tablet Take 1 tablet (600 mg total) by mouth every 6 (six) hours as needed for mild pain  or moderate pain. 03/14/16  Yes Olugbemiga Essie Christine, MD  morphine (MS CONTIN) 30 MG 12 hr tablet Take 1 tablet (30 mg total) by mouth every 12 (twelve) hours. 05/31/16  Yes Olugbemiga Essie Christine, MD  oxyCODONE (ROXICODONE) 15 MG immediate release tablet Take 15 mg by mouth 4 (four) times daily. 06/18/16  Yes Historical Provider, MD  rivaroxaban (XARELTO) 20 MG TABS tablet Take 20 mg by mouth daily.    Yes Historical Provider, MD  valACYclovir (VALTREX) 1000 MG tablet Take 1 tablet (1,000 mg total) by mouth daily. Patient taking differently: Take 1,000 mg by mouth daily as needed (cold sores).  05/02/16  Yes [name], MD    Physical Exam: Vitals:   06/26/16 1930 06/26/16 1941 06/26/16  1942 06/26/16 2144  BP: 124/84   131/83  Pulse: 88   105  Resp: 19   18  Temp:   99.1 F (37.3 C)   TempSrc:   Oral   SpO2: 99%   97%  Weight:  82.6 kg (182 lb)    Height:          Constitutional: NAD, calm, comfortable Eyes: PERRL, lids and conjunctivae normal ENMT: Mucous membranes are moist. Posterior pharynx clear of any exudate or lesions.Normal dentition.  Neck: normal, supple, no masses, no thyromegaly Respiratory: clear to auscultation bilaterally, no wheezing, no crackles. Normal respiratory effort. No accessory muscle use.  Cardiovascular: Regular rate and rhythm, no murmurs / rubs / gallops. No extremity edema. 2+ pedal pulses. No carotid bruits.  Abdomen: no tenderness, no masses palpated. No hepatosplenomegaly. Bowel sounds positive.  Musculoskeletal: no clubbing / cyanosis. No joint deformity upper and lower extremities. Good ROM, no contractures. Normal muscle tone.  Skin: no rashes, lesions, ulcers. No induration Neurologic: CN 2-12 grossly intact. Sensation intact, DTR normal. Strength 5/5 in all 4.  Psychiatric: Normal judgment and insight. Alert and oriented x 3. Normal mood.    Labs on Admission: I have personally reviewed following labs and imaging studies  CBC:  Recent Labs Lab 06/23/16 2143 06/26/16 1848  WBC 13.6* 11.0*  NEUTROABS 8.2* 5.3  HGB 8.5* 8.0*  HCT 23.9* 23.4*  MCV 89.8 89.0  PLT 509* [id number]*   Basic Metabolic Panel:  Recent Labs Lab 06/23/16 2143 06/26/16 1848  NA 138 139  K 3.5 3.5  CL 107 109  CO2 24 23  GLUCOSE 125* 99  BUN 12 11  CREATININE 0.72 1.05*  CALCIUM 8.9 8.7*   GFR: Estimated Creatinine Clearance: 82.3 mL/min (by C-G formula based on SCr of 1.05 mg/dL (H)). Liver Function Tests:  Recent Labs Lab 06/23/16 2143 06/26/16 1848  AST 23 18  ALT 18 15  ALKPHOS 54 48  BILITOT 1.8* 1.6*  PROT 7.1 7.2  ALBUMIN 4.3 4.4   No results for input(s): LIPASE, AMYLASE in the last 168 hours. No results for input(s):  AMMONIA in the last 168 hours. Coagulation Profile: No results for input(s): INR, PROTIME in the last 168 hours. Cardiac Enzymes: No results for input(s): CKTOTAL, CKMB, CKMBINDEX, TROPONINI in the last 168 hours. BNP (last 3 results) No results for input(s): PROBNP in the last 8760 hours. HbA1C: No results for input(s): HGBA1C in the last 72 hours. CBG: No results for input(s): GLUCAP in the last 168 hours. Lipid Profile: No results for input(s): CHOL, HDL, LDLCALC, TRIG, CHOLHDL, LDLDIRECT in the last 72 hours. Thyroid Function Tests: No results for input(s): TSH, T4TOTAL, FREET4, T3FREE, THYROIDAB in the last 72 hours. Anemia Panel:  Recent Labs  06/26/16 1848  RETICCTPCT 13.9*   Urine analysis:    Component Value Date/Time   COLORURINE STRAW (A) 06/26/2016 2016   APPEARANCEUR CLEAR 06/26/2016 2016   APPEARANCEUR Clear 05/06/2012 1546   LABSPEC 1.006 06/26/2016 2016   LABSPEC 1.011 05/06/2012 1546   PHURINE 6.0 06/26/2016 2016   GLUCOSEU NEGATIVE 06/26/2016 2016   GLUCOSEU Negative 05/06/2012 1546   HGBUR NEGATIVE 06/26/2016 2016   HGBUR negative 01/08/2009 [place] NEGATIVE 06/26/2016 2016   BILIRUBINUR Negative 05/06/2012 [place] 06/26/2016 2016   PROTEINUR NEGATIVE 06/26/2016 2016   UROBILINOGEN 2.0 (H) 10/26/2015 0817   NITRITE NEGATIVE 06/26/2016 2016   LEUKOCYTESUR TRACE (A) 06/26/2016 2016   LEUKOCYTESUR Negative 05/06/2012 1546   Sepsis Labs: @LABRCNTIP (procalcitonin:4,lacticidven:4) )No results found for this or any previous visit (from the past 240 hour(s)).   Radiological Exams on Admission: Dg Chest 2 View  Result Date: 06/26/2016 CLINICAL DATA:  Chest soreness for 1 day.  Sickle cell anemia. EXAM: CHEST  2 VIEW COMPARISON:  June 10, 2016 FINDINGS: Bony sclerosis, most prominent in the humeral head stomach consistent with known sickle cell. No pneumothorax. The heart size is mildly enlarged. The hila and mediastinum are  normal. Mild opacity in the right mid lung not seen previously. No other acute pulmonary abnormalities. Right Port-A-Cath is in good position. IMPRESSION: 1. Right mid lung infiltrate.  Recommend follow-up to resolution. 2. Sickle cell changes to the bones. Electronically Signed   By: Dorise Bullion III M.D   On: 06/26/2016 20:19    EKG: Independently reviewed.  Assessment/Plan Principal Problem:   Sickle cell crisis ([name]) Active Problems:   Chronic pain   Chronic anticoagulation   Infiltrate of lung present on imaging of chest    1. Sickle cell crisis - 1. Dilaudid PCA per standing orders 2. Scheduled ketorlac 3. Continue MS contin 4. Continue Hydrea 5. Daily CBC 2. Infiltrate of lung - 1. Continuous pulse ox and tele monitor 2. Will put patient on azithromycin for now 3. If she worsens then add rocephin 4. At this point will hold off on transfusion since she is very minimally symptomatic and has O2 sat of 99% on room air 5. If this is acute chest syndrome it is extremely mild at this point, but do need to follow in case it worsens. 3. Chronic anticoagulation - continue xarelto   DVT prophylaxis: Lovenox Code Status: Full Family Communication: No family in room Consults called: None Admission status: Admit to inpatient   Etta Quill DO [facility] Pager [phone] from 7PM-7AM  If 7AM-7PM, please contact the day physician for the patient [email or web address] Password [facility]  06/26/2016, 9:55 PM

## 2016-06-27 DIAGNOSIS — R918 Other nonspecific abnormal finding of lung field: Secondary | ICD-10-CM

## 2016-06-27 DIAGNOSIS — K120 Recurrent oral aphthae: Secondary | ICD-10-CM

## 2016-06-27 DIAGNOSIS — D5700 Hb-SS disease with crisis, unspecified: Principal | ICD-10-CM

## 2016-06-27 DIAGNOSIS — G8929 Other chronic pain: Secondary | ICD-10-CM

## 2016-06-27 DIAGNOSIS — Z7901 Long term (current) use of anticoagulants: Secondary | ICD-10-CM

## 2016-06-27 LAB — CBC
HEMATOCRIT: 23.3 % — AB (ref 36.0–46.0)
HEMOGLOBIN: 8.1 g/dL — AB (ref 12.0–15.0)
MCH: 30.9 pg (ref 26.0–34.0)
MCHC: 34.8 g/dL (ref 30.0–36.0)
MCV: 88.9 fL (ref 78.0–100.0)
PLATELETS: 513 10*3/uL — AB (ref 150–400)
RBC: 2.62 MIL/uL — AB (ref 3.87–5.11)
RDW: 17.6 % — ABNORMAL HIGH (ref 11.5–15.5)
WBC: 12.4 10*3/uL — AB (ref 4.0–10.5)

## 2016-06-27 LAB — RETICULOCYTES
RBC.: 2.63 MIL/uL — ABNORMAL LOW (ref 3.87–5.11)
RETIC COUNT ABSOLUTE: 370.8 10*3/uL — AB (ref 19.0–186.0)
Retic Ct Pct: 14.1 % — ABNORMAL HIGH (ref 0.4–3.1)

## 2016-06-27 LAB — DIFFERENTIAL
Basophils Absolute: 0.1 10*3/uL (ref 0.0–0.1)
Basophils Relative: 1 %
EOS ABS: 0.7 10*3/uL (ref 0.0–0.7)
EOS PCT: 5 %
LYMPHS ABS: 2.8 10*3/uL (ref 0.7–4.0)
Lymphocytes Relative: 21 %
MONOS PCT: 12 %
Monocytes Absolute: 1.6 10*3/uL — ABNORMAL HIGH (ref 0.1–1.0)
Neutro Abs: 8 10*3/uL — ABNORMAL HIGH (ref 1.7–7.7)
Neutrophils Relative %: 61 %

## 2016-06-27 LAB — RAPID URINE DRUG SCREEN, HOSP PERFORMED
AMPHETAMINES: NOT DETECTED
BENZODIAZEPINES: NOT DETECTED
Barbiturates: NOT DETECTED
COCAINE: NOT DETECTED
OPIATES: POSITIVE — AB
Tetrahydrocannabinol: NOT DETECTED

## 2016-06-27 IMAGING — XA IR US GUIDE VASC ACCESS LEFT
1 series · 1 of 1 positions shown · non-contrast
Comparison: none

INDICATION: 24-year-old pregnant female with sickle cell disease currently and
sickle cell crisis. She requires venous access for hydration and
pain control. Prior attempts at right-sided PICC placement or
unsuccessful. Left-sided placement will be attempted.

[Series 300: ir fluoro guide cv line left · 1 of 1 slices shown]
[im 1/1]
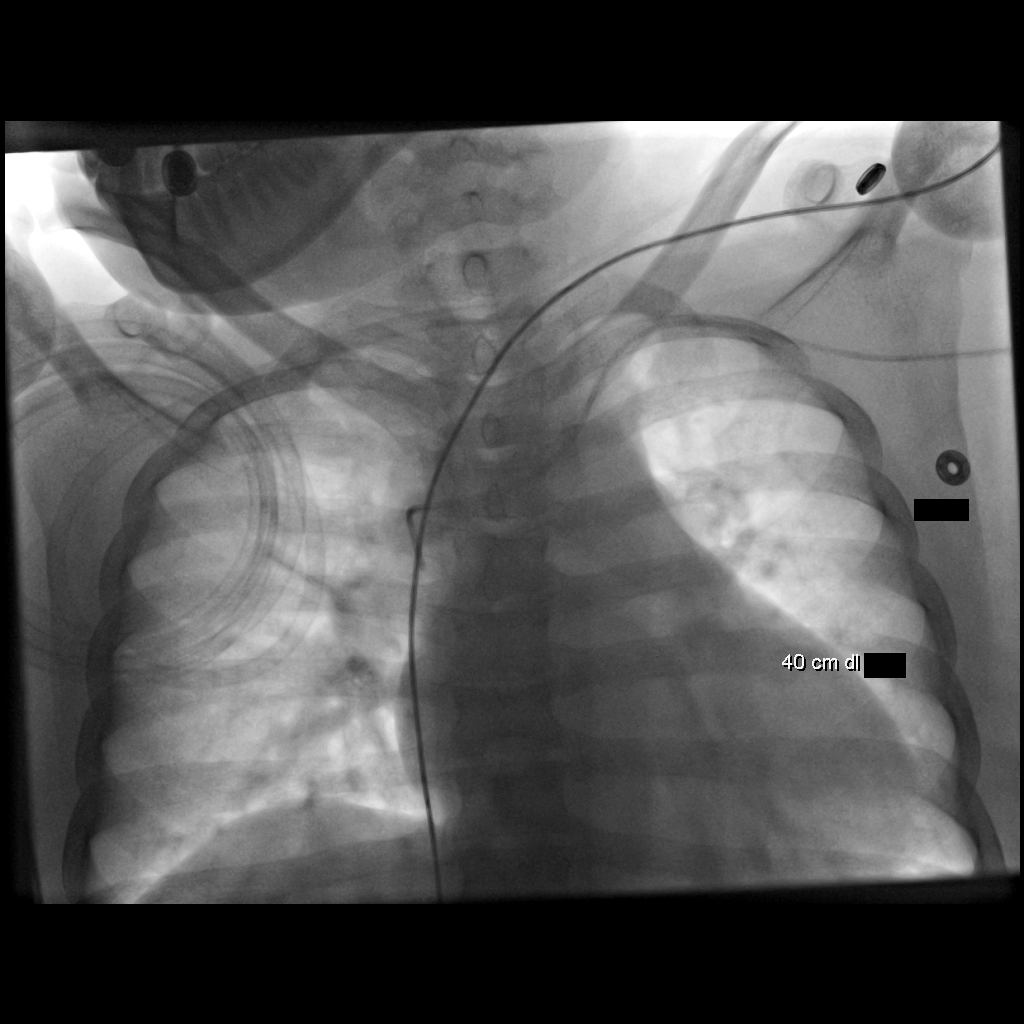

[1 of 1 positions shown; findings below may reference images not displayed]

EXAM:
PICC LINE PLACEMENT WITH ULTRASOUND AND FLUOROSCOPIC GUIDANCE

MEDICATIONS:
None required

ANESTHESIA/SEDATION:
None

FLUOROSCOPY TIME:  Fluoroscopy Time: 1 minutes 36 seconds (19 mGy).

COMPLICATIONS:
None immediate.

PROCEDURE:
The patient was advised of the possible risks and complications and
agreed to undergo the procedure. The patient was then brought to the
angiographic suite for the procedure.

The left arm was prepped with chlorhexidine, draped in the usual
sterile fashion using maximum barrier technique (cap and mask,
sterile gown, sterile gloves, large sterile sheet, hand hygiene and
cutaneous antisepsis) and infiltrated locally with 1% Lidocaine.

Ultrasound demonstrated patency of the left brachial vein, and this
was documented with an image. Under real-time ultrasound guidance,
this vein was accessed with a 21 gauge micropuncture needle and
image documentation was performed. A [DATE] wire was introduced in to
the vein. Over this, a 6 French dual lumen power injectable PICC was
advanced to the lower SVC/right atrial junction. Fluoroscopy during
the procedure and fluoro spot radiograph confirms appropriate
catheter position. The catheter was flushed and covered with a
sterile dressing.

Catheter length: 40 cm
IMPRESSION: Successful left arm power PICC line placement with ultrasound and
fluoroscopic guidance. The catheter is ready for use.

## 2016-06-27 MED ORDER — MAGIC MOUTHWASH W/LIDOCAINE
5.0000 mL | Freq: Four times a day (QID) | ORAL | Status: AC
  Administered 2016-06-27 – 2016-06-28 (×2): 5 mL via ORAL
  Filled 2016-06-27 (×9): qty 5

## 2016-06-27 MED ORDER — MAGIC MOUTHWASH
5.0000 mL | Freq: Four times a day (QID) | ORAL | Status: DC | PRN
  Filled 2016-06-27: qty 5

## 2016-06-27 MED ORDER — SODIUM CHLORIDE 0.9% FLUSH
10.0000 mL | INTRAVENOUS | Status: DC | PRN
  Administered 2016-06-30: 20 mL
  Filled 2016-06-27: qty 40

## 2016-06-27 MED ORDER — HYDROMORPHONE HCL 2 MG/ML IJ SOLN
2.0000 mg | Freq: Once | INTRAMUSCULAR | Status: AC
  Administered 2016-06-27: 2 mg via INTRAVENOUS
  Filled 2016-06-27: qty 1

## 2016-06-27 NOTE — Progress Notes (Addendum)
SICKLE CELL SERVICE PROGRESS NOTE  MIKKEL COFER [id number] DOB: [birth year] DOA: 06/26/2016 PCP: Angelica Chessman, MD  Assessment/Plan: Principal Problem:   Sickle cell crisis ([place]) Active Problems:   Chronic pain   Chronic anticoagulation   Infiltrate of lung present on imaging of chest  1. Hb SS with crisis: Pt has used an average of 1.5 mg/hr of dilaudid on the PCA. Encouraged increased use and will re-evaluate in 4 hours for further adjustments. Continue PCA at current dose, Toradol and Decrease IVF to [facility] after 24 hours of fluids.  2. 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  Pt has ulcerations in the roof of the mouth and int he left upper buccal mucosa. Will give magic mouthwash with Lidocaine x 2 days and then change to Magic Mouthwash without Lidocaine. 3. Leukocytosis: Pt has no clinical signs of infection. Will observe without antibiotics.  4. Anemia of Chronic Disease: Hb at baseline. Will observe.  5. Chronic pain: Pt now being followed by Dr. Alyson Ingles and is managed with MS Contin. Continue MS Contin 30 mg q 12 hours.  6. Chronic Anticoagulation: Continue Xarelto. 7. H/O Cocaine Use: Pt reports that she has not used nay Cocaine since 05/17/2016.   Code Status: Full Code Family Communication: N/A Disposition Plan: Not yet ready for discharge  [place].  Pager [phone]. If 7PM-7AM, please contact night-coverage.  06/27/2016, 12:32 PM  LOS: 1 day   Admitting History: Pt reports pain as 10/10 and currently localized to the low back, legs and arms. She has used the PCA at an average of 1.7 mg/hr. 19.6 mg in the last 11 hours, with 57/28:demands/deliveries. Pt complains of pain and bleeding from the roof of her mouth, and mouth pain in the filtrum of the tongue as well as in the throat. She has a h/o tonsillar stones and admits that she has been manipulating the posterior pharynx with a q-tip in efforts to remove  the stones as was instructed in a non-medically sanctioned video that she looked at on-line. She denies any oral sexual activity.  Pt advised of the expectation that she will wear the CO2 monitor during use of the PCA. She acknowledged understanding and agreed.  Consultants:  None  Procedures:  None  Antibiotics:  Azithromycin 2/11 >>2/12    Objective: Vitals:   06/27/16 0146 06/27/16 0430 06/27/16 0505 06/27/16 0807  BP:   116/86   Pulse:   (!) 110   Resp: 16 16 18 13   Temp:   99.5 F (37.5 C)   TempSrc:   Oral   SpO2: 98% 98% 99% 99%  Weight:      Height:       Weight change:   Intake/Output Summary (Last 24 hours) at 06/27/16 1232 Last data filed at 06/27/16 0900  Gross per 24 hour  Intake             1635 ml  Output              500 ml  Net             1135 ml    General: Alert, awake, oriented x3, in moderately acute distress.  HEENT: /AT PEERL, EOMI, anicteric Neck: Trachea midline,  no masses, no thyromegal,y no JVD, no carotid bruit OROPHARYNX:  Moist, No exudate in posterior pharynx. However she does have an area of whitish plaque in the roof of the mouth and in the left buccal  mucosa. There is an area of clotted blood around the plaque on the roof of the mouth.   Heart: Regular rate and rhythm, without murmurs, rubs, gallops, PMI non-displaced, no heaves or thrills on palpation.  Lungs: Clear to auscultation, no wheezing or rhonchi noted. No increased vocal fremitus resonant to percussion  Abdomen: Soft, nontender, nondistended, positive bowel sounds, no masses no hepatosplenomegaly noted..  Neuro: No focal neurological deficits noted cranial nerves II through XII grossly intact.  Strength at functional baseline in bilateral upper and lower extremities. Musculoskeletal: No warmth swelling or erythema around joints, no spinal tenderness noted. Psychiatric: Patient alert and oriented x3, good insight and cognition, good recent to remote recall. .   Data  Reviewed: Basic Metabolic Panel:  Recent Labs Lab 06/23/16 2143 06/26/16 1848  NA 138 139  K 3.5 3.5  CL 107 109  CO2 24 23  GLUCOSE 125* 99  BUN 12 11  CREATININE 0.72 1.05*  CALCIUM 8.9 8.7*   Liver Function Tests:  Recent Labs Lab 06/23/16 2143 06/26/16 1848  AST 23 18  ALT 18 15  ALKPHOS 54 48  BILITOT 1.8* 1.6*  PROT 7.1 7.2  ALBUMIN 4.3 4.4   No results for input(s): LIPASE, AMYLASE in the last 168 hours. No results for input(s): AMMONIA in the last 168 hours. CBC:  Recent Labs Lab 06/23/16 2143 06/26/16 1848 06/27/16 0640  WBC 13.6* 11.0* 12.4*  NEUTROABS 8.2* 5.3 8.0*  HGB 8.5* 8.0* 8.1*  HCT 23.9* 23.4* 23.3*  MCV 89.8 89.0 88.9  PLT 509* 520* 513*   Cardiac Enzymes: No results for input(s): CKTOTAL, CKMB, CKMBINDEX, TROPONINI in the last 168 hours. BNP (last 3 results) No results for input(s): BNP in the last 8760 hours.  ProBNP (last 3 results) No results for input(s): PROBNP in the last 8760 hours.  CBG: No results for input(s): GLUCAP in the last 168 hours.  No results found for this or any previous visit (from the past 240 hour(s)).   Studies: Dg Chest 2 View  Result Date: 06/26/2016 CLINICAL DATA:  Chest soreness for 1 day.  Sickle cell anemia. EXAM: CHEST  2 VIEW COMPARISON:  June 10, 2016 FINDINGS: Bony sclerosis, most prominent in the humeral head stomach consistent with known sickle cell. No pneumothorax. The heart size is mildly enlarged. The hila and mediastinum are normal. Mild opacity in the right mid lung not seen previously. No other acute pulmonary abnormalities. Right Port-A-Cath is in good position. IMPRESSION: 1. Right mid lung infiltrate.  Recommend follow-up to resolution. 2. Sickle cell changes to the bones. Electronically Signed   By: Dorise Bullion III M.D   On: 06/26/2016 20:19   Dg Chest 2 View  Result Date: 05/29/2016 CLINICAL DATA:  Pt with Hx sickle cell anemia c/o abdominal pain and back pain onset last  night, cough onset last night. Reports pain to be due to sickle cell anemia GERD, former smoker x 3 years ago EXAM: CHEST  2 VIEW COMPARISON:  05/19/2016 FINDINGS: Cardiac silhouette is normal in size. No mediastinal or hilar masses. No evidence of adenopathy. Clear lungs.  No pleural effusion.  No pneumothorax. Right anterior chest wall Port-A-Cath is stable with its tip in the lower superior vena cava. 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 IMPRESSION: No active cardiopulmonary disease. Electronically Signed   By: Lajean Manes M.D.   On: 05/29/2016 16:42   Dg Thoracic Spine W/swimmers  Result Date: 06/10/2016 CLINICAL DATA:  Pain after fall.  EXAM: THORACIC SPINE - 3 VIEWS COMPARISON:  None. FINDINGS: There is no evidence of thoracic spine fracture. H-shaped thoracic vertebra consistent with history of sickle cell anemia. Alignment is normal. No other significant bone abnormalities are identified. Port catheter tip is seen in the lower superior vena cava. IMPRESSION: No acute osseous abnormality of the thoracic spine. Port catheter tip in the distal SVC. H-shaped vertebral bodies consistent with sickle-cell. Electronically Signed   By: Ashley Royalty M.D.   On: 06/10/2016 03:43   Dg Lumbar Spine Complete  Result Date: 06/10/2016 CLINICAL DATA:  Patient fell.  Syncope.  Back pain. EXAM: LUMBAR SPINE - COMPLETE 4+ VIEW COMPARISON:  None. FINDINGS: There is no evidence of lumbar spine fracture. Alignment is normal. Intervertebral disc spaces are maintained. H-shaped vertebral bodies throughout the visualized thoracolumbar spine would be in keeping with vertebral body changes related to sickle cell anemia. Cholecystectomy. IMPRESSION: H-shaped vertebral bodies consistent with sickle cell anemia. No acute osseous abnormality. Electronically Signed   By: Ashley Royalty M.D.   On: 06/10/2016 03:39   Ct Head Wo Contrast  Result Date: 06/10/2016 CLINICAL DATA:  Sickle-cell crisis  and migraine. Patient fell. Syncope. EXAM: CT HEAD WITHOUT CONTRAST CT CERVICAL SPINE WITHOUT CONTRAST TECHNIQUE: Multidetector CT imaging of the head and cervical spine was performed following the standard protocol without intravenous contrast. Multiplanar CT image reconstructions of the cervical spine were also generated. COMPARISON:  None. FINDINGS: CT HEAD FINDINGS Brain: No acute intracranial hemorrhage, large vascular territory infarction, edema or midline shift. Ventricles and sulci are within normal limits for age. Parafalcine calcifications are noted anteriorly. Vascular: No large vascular territory infarct. No hyperdense vessels. No unexpected calcifications. Skull: Thickened bony calvarium consistent with history of sickle cell anemia. No acute fracture. Sinuses/Orbits: No acute finding. Other: None. CT CERVICAL SPINE FINDINGS Alignment: Normal Skull base and vertebrae: Thickened bony calvarium consistent with history of sickle cell. H-shaped vertebral bodies noted of the T1 and T2 vertebral bodies consistent with sickle cell. Soft tissues and spinal canal: No prevertebral fluid or swelling. No visible canal hematoma. Disc levels: No focal disc herniation. No significant neural foraminal encroachment. Upper chest: Negative. Other: None IMPRESSION: Thickened bony calvarium and and H-shaped upper thoracic vertebra consistent with sickle cell anemia. No acute intracranial nor cervical spinal abnormality. Electronically Signed   By: Ashley Royalty M.D.   On: 06/10/2016 03:49   Ct Angio Chest Pe W Or Wo Contrast  Result Date: 06/10/2016 CLINICAL DATA:  26 year old female with syncope. History of sickle cell anemia. EXAM: CT ANGIOGRAPHY CHEST WITH CONTRAST TECHNIQUE: Multidetector CT imaging of the chest was performed using the standard protocol during bolus administration of intravenous contrast. Multiplanar CT image reconstructions and MIPs were obtained to evaluate the vascular anatomy. CONTRAST:  48 cc  Isovue 370 COMPARISON:  Chest radiograph dated 05/29/2016 and CT dated 02/26/2016 FINDINGS: Cardiovascular: There is mild cardiomegaly with slight interval increase in the size of the heart since the prior CT. No pericardial effusion. Right pectoral infusion catheter with tip in the right atrium. The thoracic aorta appears unremarkable. The origins of the great vessels of the aortic arch appear patent. Evaluation of the pulmonary arteries is limited due to suboptimal opacification and timing of the contrast. Apparent intraluminal low density involving the segmental branches of the right lower lobe (series 6 image 130) is indeterminate and may be artifactual and related to suboptimal enhancement of the vessel. Pulmonary embolus is not entirely excluded. Correlation with clinical exam recommended. A V/Q scan  may provide better evaluation if there is high clinical concern for acute pulmonary embolus. Mediastinum/Nodes: There is no hilar or mediastinal adenopathy. The esophagus is grossly unremarkable. No thyroid nodules identified. Lungs/Pleura: There is a stable 6 mm nodule or 2 adjacent nodules in the superior segment of the left lower lobe as seen on the prior CT. Right upper lobe linear atelectasis/scarring noted. The lungs are otherwise clear. There is no pleural effusion or pneumothorax. The central airways are patent. Upper Abdomen: Lobulated heterogeneous spleen similar to prior CT likely sequela of sickle cell disease. Musculoskeletal: There is central depression of the thoracic vertebra sequela of sickle cell disease. No acute fracture. Review of the MIP images confirms the above findings. IMPRESSION: This study is essentially nondiagnostic for evaluation of pulmonary embolism due to suboptimal opacification of the pulmonary arteries. Intraluminal low attenuating area involving the segmental branches of the right lower lobe may be artifactual and related to suboptimal opacification of the these branches.  Pulmonary embolus is not entirely excluded. Correlation with clinical exam recommended. V/Q scan may provide better evaluation if there is high clinical concern for acute pulmonary embolus. No focal pulmonary consolidation. Stable appearing left lower lobe pulmonary nodule. Mild cardiomegaly with slight interval increase in the cardiac size since the prior CT. Stable appearing heterogeneous and irregular spleen. Electronically Signed   By: Anner Crete M.D.   On: 06/10/2016 07:07   Ct Cervical Spine Wo Contrast  Result Date: 06/10/2016 CLINICAL DATA:  Sickle-cell crisis and migraine. Patient fell. Syncope. EXAM: CT HEAD WITHOUT CONTRAST CT CERVICAL SPINE WITHOUT CONTRAST TECHNIQUE: Multidetector CT imaging of the head and cervical spine was performed following the standard protocol without intravenous contrast. Multiplanar CT image reconstructions of the cervical spine were also generated. COMPARISON:  None. FINDINGS: CT HEAD FINDINGS Brain: No acute intracranial hemorrhage, large vascular territory infarction, edema or midline shift. Ventricles and sulci are within normal limits for age. Parafalcine calcifications are noted anteriorly. Vascular: No large vascular territory infarct. No hyperdense vessels. No unexpected calcifications. Skull: Thickened bony calvarium consistent with history of sickle cell anemia. No acute fracture. Sinuses/Orbits: No acute finding. Other: None. CT CERVICAL SPINE FINDINGS Alignment: Normal Skull base and vertebrae: Thickened bony calvarium consistent with history of sickle cell. H-shaped vertebral bodies noted of the T1 and T2 vertebral bodies consistent with sickle cell. Soft tissues and spinal canal: No prevertebral fluid or swelling. No visible canal hematoma. Disc levels: No focal disc herniation. No significant neural foraminal encroachment. Upper chest: Negative. Other: None IMPRESSION: Thickened bony calvarium and and H-shaped upper thoracic vertebra consistent with  sickle cell anemia. No acute intracranial nor cervical spinal abnormality. Electronically Signed   By: Ashley Royalty M.D.   On: 06/10/2016 03:49   Mr Jodene Nam Head Wo Contrast  Result Date: 06/13/2016 CLINICAL DATA:  26 y/o  F; sickle cell pain crisis with syncope. EXAM: MRA HEAD WITHOUT CONTRAST TECHNIQUE: Angiographic images of the Circle of Willis were obtained using MRA technique without intravenous contrast. COMPARISON:  06/10/2016 CT head. FINDINGS: Extensive motion artifact from the upper cervical through the circle of Willis to the level of foramen of [place]ro. Suboptimal assessment for stenosis or aneurysm. Bilateral internal carotid arteries, anterior cerebral arteries, and middle cerebral arteries are patent. Symmetric distal circulation. Bilateral vertebral arteries, the basilar artery, and bilateral posterior cerebral arteries are patent. Symmetric distal circulation. IMPRESSION: Extensive motion artifact through the circle of Willis. No large vessel occlusion. Symmetric distal circulation. Electronically Signed   By: Edgardo Roys.D.  On: 06/13/2016 19:21   Nm Pulmonary Perf And Vent  Result Date: 06/10/2016 CLINICAL DATA:  Syncope.  Sickle cell. EXAM: NUCLEAR MEDICINE VENTILATION - PERFUSION LUNG SCAN TECHNIQUE: Ventilation images were obtained in multiple projections using inhaled aerosol Tc-91m DTPA. Perfusion images were obtained in multiple projections after intravenous injection of Tc-98m MAA. RADIOPHARMACEUTICALS:  30.9 mCi Technetium-24m DTPA aerosol inhalation and 4.1 mCi Technetium-81m MAA IV COMPARISON:  Chest x-ray 06/10/2016.  Chest CT 06/10/2016 FINDINGS: No significant focal ventilation-perfusion mismatches noted. This is a low probability scan for pulmonary embolus . IMPRESSION: Low probability pulmonary embolus. Electronically Signed   By: Marcello Moores  Register   On: 06/10/2016 13:34   Dg Chest Port 1 View  Result Date: 06/10/2016 CLINICAL DATA:  Syncope.  History of sickle  cell anemia. EXAM: PORTABLE CHEST 1 VIEW COMPARISON:  CT 06/10/2016 . FINDINGS: PowerPort catheter noted with lead tip in the superior vena cava. Mild cardiomegaly. No evidence of congestive heart failure. No pleural effusion or pneumothorax. No acute bony abnormality. Sclerotic changes of both humeral heads most likely from avascular necrosis. IMPRESSION: 1. PowerPort catheter noted with tip projected over superior vena cava. 2. Borderline cardiomegaly.  No CHF.  No acute pulmonary disease. 3.  Avascular necrosis both humeral heads. Electronically Signed   By: Marcello Moores  Register   On: 06/10/2016 13:32   Dg Humerus Left  Result Date: 06/10/2016 CLINICAL DATA:  Patient fell.  Sickle cell anemia. EXAM: LEFT HUMERUS - 2+ VIEW COMPARISON:  05/22/2016 left shoulder radiographs FINDINGS: Informed sclerotic appearance of the left humeral head consistent with osteonecrosis. No loss of the sphericity of the humeral head on current exam. No acute fracture nor bone destruction. IMPRESSION: Mild sclerosis of the humeral head consistent with osteonecrosis. No acute osseous abnormality. Electronically Signed   By: Ashley Royalty M.D.   On: 06/10/2016 03:41    Scheduled Meds: . azithromycin  250 mg Oral Daily  . HYDROmorphone   Intravenous Q4H  . hydroxyurea  1,000 mg Oral Daily  . ketorolac  30 mg Intravenous Q6H  . magic mouthwash w/lidocaine  5 mL Oral QID  . morphine  30 mg Oral Q12H  . rivaroxaban  20 mg Oral Q lunch  . senna-docusate  1 tablet Oral BID   Continuous Infusions: . sodium chloride 125 mL/hr at 06/27/16 [id number]    Principal Problem:   Sickle cell crisis [facility]) Active Problems:   Chronic pain   Chronic anticoagulation   Infiltrate of lung present on imaging of chest    In excess of 35 minutes spent during this visit. Greater than 50% involved face to face contact with the patient for assessment, counseling and coordination of care.

## 2016-06-27 NOTE — Progress Notes (Signed)
Patient ID: [name], female   DOB: 12/01/1990, 26 y.o.   MRN: [id number] Called by nurse for patient reporting pain escalating and requesting Dilaudid 2 mg every 2 hours by IVP. I explained to patient this morning that it was important for her increase use of PCA before scheduled doses were added to regimen. Pt sill only using PCA less than 50% of time available time despite being awake. She then reported that she was having chest pain.  Requested an EKG which showed non-specific S-T changes. When I arrived in the room. She was sitting in the bed and requested more pain medication but did not once refer to the pain in her chest. When asked she stated that her chest pain had resolved and she thought it was gas. She reports that the button on the PCA has not been lighting up green when bolus available. Nurse made aware and will attend to rectifying problem. She has received a 1 time dose of Dilaudid IVP. If still having pain will increase PCA bolus dose.   [name] A.

## 2016-06-27 NOTE — Progress Notes (Signed)
Report called to RN Festus Holts on 3W- Pt to be transferred to room 1332. Pt currently in restroom, will transfer via W/C once ready.

## 2016-06-28 ENCOUNTER — Ambulatory Visit: Payer: Self-pay | Admitting: Internal Medicine

## 2016-06-28 ENCOUNTER — Inpatient Hospital Stay (HOSPITAL_COMMUNITY): Payer: Medicare Other

## 2016-06-28 DIAGNOSIS — M542 Cervicalgia: Secondary | ICD-10-CM

## 2016-06-28 DIAGNOSIS — R221 Localized swelling, mass and lump, neck: Secondary | ICD-10-CM

## 2016-06-28 MED ORDER — HYDROMORPHONE HCL 2 MG/ML IJ SOLN
2.0000 mg | INTRAMUSCULAR | Status: DC | PRN
  Administered 2016-06-29 – 2016-06-30 (×5): 2 mg via INTRAVENOUS
  Filled 2016-06-28 (×7): qty 1

## 2016-06-28 MED ORDER — HYDROMORPHONE HCL 2 MG/ML IJ SOLN
2.0000 mg | INTRAMUSCULAR | Status: AC
  Administered 2016-06-28 – 2016-06-29 (×8): 2 mg via INTRAVENOUS
  Filled 2016-06-28 (×6): qty 1

## 2016-06-28 MED ORDER — PANTOPRAZOLE SODIUM 40 MG PO TBEC
40.0000 mg | DELAYED_RELEASE_TABLET | Freq: Every day | ORAL | Status: DC
  Administered 2016-06-30 – 2016-07-02 (×3): 40 mg via ORAL
  Filled 2016-06-28 (×3): qty 1

## 2016-06-28 NOTE — Progress Notes (Signed)
SICKLE CELL SERVICE PROGRESS NOTE  [name] [id number] DOB: [birth year] DOA: 06/26/2016 PCP: Angelica Chessman, MD  Assessment/Plan: Principal Problem:   Sickle cell crisis ([place]) Active Problems:   Chronic pain   Chronic anticoagulation   Infiltrate of lung present on imaging of chest  1. Hb SS with crisis: Will continue PCA bolus dose at 0.7 , Toradol and continue IVF at [facility].  Will add clinician assisted doses as she typically becomes narcotized at a PCA bolus dose of 0.8 mg and then is unable to use the PCA to control her pain.  2. Aphtous Ulcers:Improved with use of  magic mouthwash. She denies any further mouth pain and is refusing magic mouthwash currently. 3. Menstrual Blood Loss: Will keep track of the amount of pads and blood on the pads as currently the pad has only scant amount of blood and Hb is WNL's from yesterday. 4. Left neck pain: X-ray neck soft tissue. 5. Leukocytosis: Pt has no clinical signs of infection. Will observe without antibiotics.  6. Anemia of Chronic Disease: Hb at baseline. Will observe.  7. Chronic pain: Pt now being followed by Dr. Alyson Ingles and is managed with MS Contin. Continue MS Contin 30 mg q 12 hours.  8. Chronic Anticoagulation: Continue Xarelto. 9. H/O Cocaine Use: Pt reports that she has not used nay Cocaine since 05/17/2016.   Code Status: Full Code Family Communication: N/A Disposition Plan: Not yet ready for discharge  [place].  Pager [phone]. If 7PM-7AM, please contact night-coverage.  06/28/2016, 12:25 PM  LOS: 2 days   Admitting History: Pt reports pain as 9/10 and currently localized to the low back, ribs, and arms. She has used 33.6 mg in the last 16 hours, with 62/48:demands/deliveries. Pt  Now complains of pain in the left neck and throat. She reports that she has no furthter pain in her mouth. She has a h/o tonsillar stones and admits that she has been manipulating the posterior pharynx with a q-tip in efforts  to remove the stones as was instructed in a non-medically sanctioned video that she looked at on-line. She denies any oral sexual activity.  Pt advised of the expectation that she will wear the CO2 monitor during use of the PCA. She acknowledged understanding and agreed. Pt initially reported that she was having menstrual bleeding since her last discharge. However she now reports that she started having menstrual bleeding since 2/5 and it is not yet resolved. She states that she did not obtain Provera as prescribed after last hospitalization and that menses stopped about a week after her last discharge from the hospital. Pt expressing frustration at the fact that she was being asked by the nurse about a change in IV rate from 20 ml/hr to 125 ml/hr since she was last in the room. (Pt has a h/o changing the rates on the IV pump). I explained to the patient that I was unable to address her concerns of being judged by her past actions and advised that she speak with the unit manager. I also offered her [employer] as she seemed upset and she agreed.   Consultants:  None  Procedures:  None  Antibiotics:  Azithromycin 2/11 >>2/12   Objective: Vitals:   06/28/16 0424 06/28/16 0437 06/28/16 0642 06/28/16 0751  BP:   (!) 116/95   Pulse:   81   Resp: 10 11 16 16   Temp:   98.5 F (36.9 C)   TempSrc:   Oral   SpO2: 98% 100%  97% 100%  Weight:   82.1 kg (181 lb)   Height:       Weight change: 0.454 kg (1 lb)  Intake/Output Summary (Last 24 hours) at 06/28/16 1225 Last data filed at 06/28/16 0950  Gross per 24 hour  Intake             1605 ml  Output             1900 ml  Net             -295 ml    General: Alert, awake, oriented x3, in moderately acute distress. Crying stating that she is frustrated.  HEENT: [place]/AT PEERL, EOMI, anicteric Neck: Trachea midline,  no masses, no thyromegal,y no JVD, no carotid bruit OROPHARYNX:  Moist, No exudate in posterior pharynx. However she does have  an area of whitish plaque in the roof of the mouth and in the left buccal mucosa. Posterior Pharynx without exudate or erythema.  She has palpable swelling and lymphadenopathy at the left lateral neck. Will obtain soft tissue x-ray of her neck. Heart: Regular rate and rhythm, without murmurs, rubs, gallops, PMI non-displaced, no heaves or thrills on palpation.  Lungs: Clear to auscultation, no wheezing or rhonchi noted. No increased vocal fremitus resonant to percussion  Abdomen: Soft, nontender, nondistended, positive bowel sounds, no masses no hepatosplenomegaly noted..  Neuro: No focal neurological deficits noted cranial nerves II through XII grossly intact.  Strength at functional baseline in bilateral upper and lower extremities. Musculoskeletal: No warmth swelling or erythema around joints, no spinal tenderness noted. Psychiatric: Patient alert and oriented x3, crying as the nurse commented that there was a change in IV rate that was not made by the nurse..   Data Reviewed: Basic Metabolic Panel:  Recent Labs Lab 06/23/16 2143 06/26/16 1848  NA 138 139  K 3.5 3.5  CL 107 109  CO2 24 23  GLUCOSE 125* 99  BUN 12 11  CREATININE 0.72 1.05*  CALCIUM 8.9 8.7*   Liver Function Tests:  Recent Labs Lab 06/23/16 2143 06/26/16 1848  AST 23 18  ALT 18 15  ALKPHOS 54 48  BILITOT 1.8* 1.6*  PROT 7.1 7.2  ALBUMIN 4.3 4.4   No results for input(s): LIPASE, AMYLASE in the last 168 hours. No results for input(s): AMMONIA in the last 168 hours. CBC:  Recent Labs Lab 06/23/16 2143 06/26/16 1848 06/27/16 0640  WBC 13.6* 11.0* 12.4*  NEUTROABS 8.2* 5.3 8.0*  HGB 8.5* 8.0* 8.1*  HCT 23.9* 23.4* 23.3*  MCV 89.8 89.0 88.9  PLT 509* 520* 513*   Cardiac Enzymes: No results for input(s): CKTOTAL, CKMB, CKMBINDEX, TROPONINI in the last 168 hours. BNP (last 3 results) No results for input(s): BNP in the last 8760 hours.  ProBNP (last 3 results) No results for input(s): PROBNP in  the last 8760 hours.  CBG: No results for input(s): GLUCAP in the last 168 hours.  No results found for this or any previous visit (from the past 240 hour(s)).   Studies: Dg Chest 2 View  Result Date: 06/26/2016 CLINICAL DATA:  Chest soreness for 1 day.  Sickle cell anemia. EXAM: CHEST  2 VIEW COMPARISON:  June 10, 2016 FINDINGS: Bony sclerosis, most prominent in the humeral head stomach consistent with known sickle cell. No pneumothorax. The heart size is mildly enlarged. The hila and mediastinum are normal. Mild opacity in the right mid lung not seen previously. No other acute pulmonary abnormalities. Right Port-A-Cath is in good  position. IMPRESSION: 1. Right mid lung infiltrate.  Recommend follow-up to resolution. 2. Sickle cell changes to the bones. Electronically Signed   By: Dorise Bullion III M.D   On: 06/26/2016 20:19   Dg Chest 2 View  Result Date: 05/29/2016 CLINICAL DATA:  Pt with Hx sickle cell anemia c/o abdominal pain and back pain onset last night, cough onset last night. Reports pain to be due to sickle cell anemia GERD, former smoker x 3 years ago EXAM: CHEST  2 VIEW COMPARISON:  05/19/2016 FINDINGS: Cardiac silhouette is normal in size. No mediastinal or hilar masses. No evidence of adenopathy. Clear lungs.  No pleural effusion.  No pneumothorax. Right anterior chest wall Port-A-Cath is stable with its tip in the lower superior vena cava. 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 IMPRESSION: No active cardiopulmonary disease. Electronically Signed   By: Lajean Manes M.D.   On: 05/29/2016 16:42   Dg Thoracic Spine W/swimmers  Result Date: 06/10/2016 CLINICAL DATA:  Pain after fall. EXAM: THORACIC SPINE - 3 VIEWS COMPARISON:  None. FINDINGS: There is no evidence of thoracic spine fracture. H-shaped thoracic vertebra consistent with history of sickle cell anemia. Alignment is normal. No other significant bone abnormalities are identified. Port  catheter tip is seen in the lower superior vena cava. IMPRESSION: No acute osseous abnormality of the thoracic spine. Port catheter tip in the distal SVC. H-shaped vertebral bodies consistent with sickle-cell. Electronically Signed   By: Ashley Royalty M.D.   On: 06/10/2016 03:43   Dg Lumbar Spine Complete  Result Date: 06/10/2016 CLINICAL DATA:  Patient fell.  Syncope.  Back pain. EXAM: LUMBAR SPINE - COMPLETE 4+ VIEW COMPARISON:  None. FINDINGS: There is no evidence of lumbar spine fracture. Alignment is normal. Intervertebral disc spaces are maintained. H-shaped vertebral bodies throughout the visualized thoracolumbar spine would be in keeping with vertebral body changes related to sickle cell anemia. Cholecystectomy. IMPRESSION: H-shaped vertebral bodies consistent with sickle cell anemia. No acute osseous abnormality. Electronically Signed   By: Ashley Royalty M.D.   On: 06/10/2016 03:39   Ct Head Wo Contrast  Result Date: 06/10/2016 CLINICAL DATA:  Sickle-cell crisis and migraine. Patient fell. Syncope. EXAM: CT HEAD WITHOUT CONTRAST CT CERVICAL SPINE WITHOUT CONTRAST TECHNIQUE: Multidetector CT imaging of the head and cervical spine was performed following the standard protocol without intravenous contrast. Multiplanar CT image reconstructions of the cervical spine were also generated. COMPARISON:  None. FINDINGS: CT HEAD FINDINGS Brain: No acute intracranial hemorrhage, large vascular territory infarction, edema or midline shift. Ventricles and sulci are within normal limits for age. Parafalcine calcifications are noted anteriorly. Vascular: No large vascular territory infarct. No hyperdense vessels. No unexpected calcifications. Skull: Thickened bony calvarium consistent with history of sickle cell anemia. No acute fracture. Sinuses/Orbits: No acute finding. Other: None. CT CERVICAL SPINE FINDINGS Alignment: Normal Skull base and vertebrae: Thickened bony calvarium consistent with history of sickle cell.  H-shaped vertebral bodies noted of the T1 and T2 vertebral bodies consistent with sickle cell. Soft tissues and spinal canal: No prevertebral fluid or swelling. No visible canal hematoma. Disc levels: No focal disc herniation. No significant neural foraminal encroachment. Upper chest: Negative. Other: None IMPRESSION: Thickened bony calvarium and and H-shaped upper thoracic vertebra consistent with sickle cell anemia. No acute intracranial nor cervical spinal abnormality. Electronically Signed   By: Ashley Royalty M.D.   On: 06/10/2016 03:49   Ct Angio Chest Pe W Or Wo Contrast  Result Date: 06/10/2016 CLINICAL  DATA:  26 year old female with syncope. History of sickle cell anemia. EXAM: CT ANGIOGRAPHY CHEST WITH CONTRAST TECHNIQUE: Multidetector CT imaging of the chest was performed using the standard protocol during bolus administration of intravenous contrast. Multiplanar CT image reconstructions and MIPs were obtained to evaluate the vascular anatomy. CONTRAST:  48 cc Isovue 370 COMPARISON:  Chest radiograph dated 05/29/2016 and CT dated 02/26/2016 FINDINGS: Cardiovascular: There is mild cardiomegaly with slight interval increase in the size of the heart since the prior CT. No pericardial effusion. Right pectoral infusion catheter with tip in the right atrium. The thoracic aorta appears unremarkable. The origins of the great vessels of the aortic arch appear patent. Evaluation of the pulmonary arteries is limited due to suboptimal opacification and timing of the contrast. Apparent intraluminal low density involving the segmental branches of the right lower lobe (series 6 image 130) is indeterminate and may be artifactual and related to suboptimal enhancement of the vessel. Pulmonary embolus is not entirely excluded. Correlation with clinical exam recommended. A V/Q scan may provide better evaluation if there is high clinical concern for acute pulmonary embolus. Mediastinum/Nodes: There is no hilar or mediastinal  adenopathy. The esophagus is grossly unremarkable. No thyroid nodules identified. Lungs/Pleura: There is a stable 6 mm nodule or 2 adjacent nodules in the superior segment of the left lower lobe as seen on the prior CT. Right upper lobe linear atelectasis/scarring noted. The lungs are otherwise clear. There is no pleural effusion or pneumothorax. The central airways are patent. Upper Abdomen: Lobulated heterogeneous spleen similar to prior CT likely sequela of sickle cell disease. Musculoskeletal: There is central depression of the thoracic vertebra sequela of sickle cell disease. No acute fracture. Review of the MIP images confirms the above findings. IMPRESSION: This study is essentially nondiagnostic for evaluation of pulmonary embolism due to suboptimal opacification of the pulmonary arteries. Intraluminal low attenuating area involving the segmental branches of the right lower lobe may be artifactual and related to suboptimal opacification of the these branches. Pulmonary embolus is not entirely excluded. Correlation with clinical exam recommended. V/Q scan may provide better evaluation if there is high clinical concern for acute pulmonary embolus. No focal pulmonary consolidation. Stable appearing left lower lobe pulmonary nodule. Mild cardiomegaly with slight interval increase in the cardiac size since the prior CT. Stable appearing heterogeneous and irregular spleen. Electronically Signed   By: Anner Crete M.D.   On: 06/10/2016 07:07   Ct Cervical Spine Wo Contrast  Result Date: 06/10/2016 CLINICAL DATA:  Sickle-cell crisis and migraine. Patient fell. Syncope. EXAM: CT HEAD WITHOUT CONTRAST CT CERVICAL SPINE WITHOUT CONTRAST TECHNIQUE: Multidetector CT imaging of the head and cervical spine was performed following the standard protocol without intravenous contrast. Multiplanar CT image reconstructions of the cervical spine were also generated. COMPARISON:  None. FINDINGS: CT HEAD FINDINGS Brain: No  acute intracranial hemorrhage, large vascular territory infarction, edema or midline shift. Ventricles and sulci are within normal limits for age. Parafalcine calcifications are noted anteriorly. Vascular: No large vascular territory infarct. No hyperdense vessels. No unexpected calcifications. Skull: Thickened bony calvarium consistent with history of sickle cell anemia. No acute fracture. Sinuses/Orbits: No acute finding. Other: None. CT CERVICAL SPINE FINDINGS Alignment: Normal Skull base and vertebrae: Thickened bony calvarium consistent with history of sickle cell. H-shaped vertebral bodies noted of the T1 and T2 vertebral bodies consistent with sickle cell. Soft tissues and spinal canal: No prevertebral fluid or swelling. No visible canal hematoma. Disc levels: No focal disc herniation. No significant neural  foraminal encroachment. Upper chest: Negative. Other: None IMPRESSION: Thickened bony calvarium and and H-shaped upper thoracic vertebra consistent with sickle cell anemia. No acute intracranial nor cervical spinal abnormality. Electronically Signed   By: Ashley Royalty M.D.   On: 06/10/2016 03:49   Mr Jodene Nam Head Wo Contrast  Result Date: 06/13/2016 CLINICAL DATA:  26 y/o  F; sickle cell pain crisis with syncope. EXAM: MRA HEAD WITHOUT CONTRAST TECHNIQUE: Angiographic images of the Circle of Willis were obtained using MRA technique without intravenous contrast. COMPARISON:  06/10/2016 CT head. FINDINGS: Extensive motion artifact from the upper cervical through the circle of Willis to the level of foramen of [place]. Suboptimal assessment for stenosis or aneurysm. Bilateral internal carotid arteries, anterior cerebral arteries, and middle cerebral arteries are patent. Symmetric distal circulation. Bilateral vertebral arteries, the basilar artery, and bilateral posterior cerebral arteries are patent. Symmetric distal circulation. IMPRESSION: Extensive motion artifact through the circle of Willis. No large  vessel occlusion. Symmetric distal circulation. Electronically Signed   By: Kristine Garbe M.D.   On: 06/13/2016 19:21   Nm Pulmonary Perf And Vent  Result Date: 06/10/2016 CLINICAL DATA:  Syncope.  Sickle cell. EXAM: NUCLEAR MEDICINE VENTILATION - PERFUSION LUNG SCAN TECHNIQUE: Ventilation images were obtained in multiple projections using inhaled aerosol Tc-16m DTPA. Perfusion images were obtained in multiple projections after intravenous injection of Tc-73m MAA. RADIOPHARMACEUTICALS:  30.9 mCi Technetium-39m DTPA aerosol inhalation and 4.1 mCi Technetium-36m MAA IV COMPARISON:  Chest x-ray 06/10/2016.  Chest CT 06/10/2016 FINDINGS: No significant focal ventilation-perfusion mismatches noted. This is a low probability scan for pulmonary embolus . IMPRESSION: Low probability pulmonary embolus. Electronically Signed   By: Marcello Moores  Register   On: 06/10/2016 13:34   Dg Chest Port 1 View  Result Date: 06/10/2016 CLINICAL DATA:  Syncope.  History of sickle cell anemia. EXAM: PORTABLE CHEST 1 VIEW COMPARISON:  CT 06/10/2016 . FINDINGS: PowerPort catheter noted with lead tip in the superior vena cava. Mild cardiomegaly. No evidence of congestive heart failure. No pleural effusion or pneumothorax. No acute bony abnormality. Sclerotic changes of both humeral heads most likely from avascular necrosis. IMPRESSION: 1. PowerPort catheter noted with tip projected over superior vena cava. 2. Borderline cardiomegaly.  No CHF.  No acute pulmonary disease. 3.  Avascular necrosis both humeral heads. Electronically Signed   By: Marcello Moores  Register   On: 06/10/2016 13:32   Dg Humerus Left  Result Date: 06/10/2016 CLINICAL DATA:  Patient fell.  Sickle cell anemia. EXAM: LEFT HUMERUS - 2+ VIEW COMPARISON:  05/22/2016 left shoulder radiographs FINDINGS: Informed sclerotic appearance of the left humeral head consistent with osteonecrosis. No loss of the sphericity of the humeral head on current exam. No acute fracture nor  bone destruction. IMPRESSION: Mild sclerosis of the humeral head consistent with osteonecrosis. No acute osseous abnormality. Electronically Signed   By: Ashley Royalty M.D.   On: 06/10/2016 03:41    Scheduled Meds: . HYDROmorphone   Intravenous Q4H  . hydroxyurea  1,000 mg Oral Daily  . ketorolac  30 mg Intravenous Q6H  . magic mouthwash w/lidocaine  5 mL Oral QID  . morphine  30 mg Oral Q12H  . rivaroxaban  20 mg Oral Q lunch  . senna-docusate  1 tablet Oral BID   Continuous Infusions:   Principal Problem:   Sickle cell crisis ([name]) Active Problems:   Chronic pain   Chronic anticoagulation   Infiltrate of lung present on imaging of chest    In excess of 35 minutes spent  during this visit. Greater than 50% involved face to face contact with the patient for assessment, counseling and coordination of care.

## 2016-06-28 NOTE — Progress Notes (Signed)
Tele sitter monitor called to inform me that the patient had disconnected the tele monitor. In having conversation with [name] explained to her the need to monitor for her safety. Patient still refused to use the tele sitter. Charge nurse was notified

## 2016-06-28 NOTE — Progress Notes (Signed)
I went to patient's room and found that patient has been giving herself boluses from her iv fluid, the rate has been changed from 20 cc/hr to 125 cc/hr, patient had also turned off her bed alarm.  I notified [name] who happened to be in the patient's room. [name] told me to notify Richardson Landry. Tele sitter was ordered for patient.

## 2016-06-28 NOTE — Progress Notes (Signed)
Called to department by primary nurse secondary to patient disconnecting the tele monitor device.  Patient very upset and tearful upon arrival to unit.  Patient states "It is my right to not have the monitor or physical sitter in my room."    Patient feels as if she is being punished for things that have occurred during previous admissions.   Explained to the patient that the tele monitor was in place to keep her safe, patient continues to refuse. Patient is alert and oriented and has agreed to call for assistance before ambulating, bed alarm also activated.  Patient also stated that she will not adjust the IVF rate as she had done in the past.   Tele monitor removed from room and primary nurse updated.

## 2016-06-29 DIAGNOSIS — D62 Acute posthemorrhagic anemia: Secondary | ICD-10-CM

## 2016-06-29 LAB — CBC WITH DIFFERENTIAL/PLATELET
Basophils Absolute: 0 10*3/uL (ref 0.0–0.1)
Basophils Relative: 0 %
EOS ABS: 1.1 10*3/uL — AB (ref 0.0–0.7)
Eosinophils Relative: 6 %
HEMATOCRIT: 19.5 % — AB (ref 36.0–46.0)
Hemoglobin: 7 g/dL — ABNORMAL LOW (ref 12.0–15.0)
Lymphocytes Relative: 17 %
Lymphs Abs: 2.8 10*3/uL (ref 0.7–4.0)
MCH: 32.3 pg (ref 26.0–34.0)
MCHC: 35.9 g/dL (ref 30.0–36.0)
MCV: 89.9 fL (ref 78.0–100.0)
MONO ABS: 1.3 10*3/uL — AB (ref 0.1–1.0)
MONOS PCT: 8 %
NEUTROS ABS: 11.1 10*3/uL — AB (ref 1.7–7.7)
Neutrophils Relative %: 69 %
Platelets: 505 10*3/uL — ABNORMAL HIGH (ref 150–400)
RBC: 2.17 MIL/uL — ABNORMAL LOW (ref 3.87–5.11)
RDW: 18.2 % — AB (ref 11.5–15.5)
WBC: 16.3 10*3/uL — ABNORMAL HIGH (ref 4.0–10.5)

## 2016-06-29 LAB — BASIC METABOLIC PANEL
ANION GAP: 4 — AB (ref 5–15)
BUN: 9 mg/dL (ref 6–20)
CALCIUM: 8.7 mg/dL — AB (ref 8.9–10.3)
CHLORIDE: 110 mmol/L (ref 101–111)
CO2: 25 mmol/L (ref 22–32)
Creatinine, Ser: 0.85 mg/dL (ref 0.44–1.00)
GFR calc Af Amer: >60 mL/min (ref >60)
GFR calc non Af Amer: >60 mL/min (ref >60)
GLUCOSE: 108 mg/dL — AB (ref 65–99)
POTASSIUM: 4 mmol/L (ref 3.5–5.1)
Sodium: 139 mmol/L (ref 135–145)

## 2016-06-29 LAB — RETICULOCYTES
RBC.: 2.17 MIL/uL — AB (ref 3.87–5.11)
Retic Count, Absolute: 275.6 10*3/uL — ABNORMAL HIGH (ref 19.0–186.0)
Retic Ct Pct: 12.7 % — ABNORMAL HIGH (ref 0.4–3.1)

## 2016-06-29 NOTE — Progress Notes (Signed)
This morning around 0830 I heard patients bed alarm sounding - on arrival to room patient was in bathroom - alarm was being reset by another RN.  When the patient exited the bathroom I went in to the room to speak with her.  I noted that the rate of her IV pump for was 125 ml/hr instead of the ordered 20 ml/hr.  She asked what I was looking at and I replied the IV rate was incorrect.  She stated that she did not touch the IV pump.  Adline Peals entered the room and confirmed that she had just hung a 253ml bag of NS and the rate was 47ml/hr a few minutes ago.  I changed the rate back to 43ml/hr and locked the pump.  The patient stated that the pump was locked earlier and she unlocked it to silence the beeping.  We explained that she should not be pressing anything on the pump except for the PCA controller button.   I spoke with Serina Cowper CNO and she came back to the patient room with me.  She explained her concern about the changing IV fluid rates and that we would be exchanging the pump to ensure there were no issues with the equipment.  This pump was just inspected in January as well.  She also informed the patient that if there continued to be changes in the rate with the new pump that the IV would need to be removed for her safety.  Patient stated that she understood.  When I returned to the room around 1000 the rate remained at 48ml/hr and the pump was locked.  Discussed with Adline Peals and we will continue to monitor closely.

## 2016-06-29 NOTE — Progress Notes (Signed)
SICKLE CELL SERVICE PROGRESS NOTE  [name] [id number] DOB: 14-Mar-1996 DOA: 06/26/2016 PCP: Angelica Chessman, MD  Assessment/Plan: Principal Problem:   Sickle cell crisis ([place]) Active Problems:   Chronic pain   Chronic anticoagulation   Infiltrate of lung present on imaging of chest  1. Hb SS with crisis: Will continue PCA bolus dose at 0.7 , Toradol and continue IVF at [facility].  Will add clinician assisted doses as she typically becomes narcotized at a PCA bolus dose of 0.8 mg and then is unable to use the PCA to control her pain.  2. Aphtous Ulcers:Improved with use of  magic mouthwash. She denies any further mouth pain and is refusing magic mouthwash currently. 3. Menstrual Blood Loss: Nurse reports 6 sanitary pads with light amount of blood. Will hold on any steroid intervention as flow decreasing and pain improving.  4. Left neck pain: Resolved without intervenetion 5. Leukocytosis: Pt has no clinical signs of infection. Will observe without antibiotics.  6. Anemia of Chronic Disease: Hb at baseline. Will observe.  7. Chronic pain: Pt now being followed by Dr. Alyson Ingles and is managed with MS Contin. Continue MS Contin 30 mg q 12 hours.  8. Chronic Anticoagulation: Continue Xarelto. 9. H/O Cocaine Use: Pt reports that she has not used nay Cocaine since 05/17/2016. UDS negative for any THC or Cocaine.   Code Status: Full Code Family Communication: N/A Disposition Plan: Anticipate discharge in 48 hours.   [name] A.  Pager [phone]. If 7PM-7AM, please contact night-coverage.  06/29/2016, 3:17 PM  LOS: 3 days   Interim History: Pt reports that she no longer has any moth or throat pain. Pt's last BM was yesterday. Pain rates pain at 8/10 and localized to her left upper back.  Consultants:  None  Procedures:  None  Antibiotics:  Azithromycin 2/11 >>2/12   Objective: Vitals:   06/29/16 0744 06/29/16 1030 06/29/16 1245 06/29/16 1512  BP:  112/81  103/61   Pulse:  (!) 103  91  Resp: 14 11 13 10   Temp:  98.2 F (36.8 C)  97.5 F (36.4 C)  TempSrc:  Oral  Oral  SpO2: 99% 98% 96% 97%  Weight:      Height:       Weight change: 5.899 kg (13 lb 0.1 oz)  Intake/Output Summary (Last 24 hours) at 06/29/16 1517 Last data filed at 06/29/16 1031  Gross per 24 hour  Intake             1005 ml  Output             1000 ml  Net                5 ml    General: Alert, awake, oriented x3, in moderately acute distress. Crying stating that she is frustrated.  HEENT: [place]/AT PEERL, EOMI, anicteric OROPHARYNX:  Moist, No exudate in posterior pharynx.  Heart: Regular rate and rhythm, without murmurs, rubs, gallops, PMI non-displaced, no heaves or thrills on palpation.  Lungs: Clear to auscultation, no wheezing or rhonchi noted. No increased vocal fremitus resonant to percussion  Abdomen: Soft, nontender, nondistended, positive bowel sounds, no masses no hepatosplenomegaly noted.  Neuro: No focal neurological deficits noted cranial nerves II through XII grossly intact.  Strength at functional baseline in bilateral upper and lower extremities. Musculoskeletal: No warmth swelling or erythema around joints, no spinal tenderness noted. Psychiatric: Patient alert and oriented x3   Data Reviewed: Basic Metabolic Panel:  Recent Labs Lab  06/23/16 2143 06/26/16 1848 06/29/16 0458  NA 138 139 139  K 3.5 3.5 4.0  CL 107 109 110  CO2 24 23 25   GLUCOSE 125* 99 108*  BUN 12 11 9   CREATININE 0.72 1.05* 0.85  CALCIUM 8.9 8.7* 8.7*   Liver Function Tests:  Recent Labs Lab 06/23/16 2143 06/26/16 1848  AST 23 18  ALT 18 15  ALKPHOS 54 48  BILITOT 1.8* 1.6*  PROT 7.1 7.2  ALBUMIN 4.3 4.4   No results for input(s): LIPASE, AMYLASE in the last 168 hours. No results for input(s): AMMONIA in the last 168 hours. CBC:  Recent Labs Lab 06/23/16 2143 06/26/16 1848 06/27/16 0640 06/29/16 0458  WBC 13.6* 11.0* 12.4* 16.3*  NEUTROABS 8.2* 5.3 8.0*  11.1*  HGB 8.5* 8.0* 8.1* 7.0*  HCT 23.9* 23.4* 23.3* 19.5*  MCV 89.8 89.0 88.9 89.9  PLT 509* 520* 513* 505*   Cardiac Enzymes: No results for input(s): CKTOTAL, CKMB, CKMBINDEX, TROPONINI in the last 168 hours. BNP (last 3 results) No results for input(s): BNP in the last 8760 hours.  ProBNP (last 3 results) No results for input(s): PROBNP in the last 8760 hours.  CBG: No results for input(s): GLUCAP in the last 168 hours.  No results found for this or any previous visit (from the past 240 hour(s)).   Studies: Dg Neck Soft Tissue  Result Date: 06/28/2016 CLINICAL DATA:  Neck pain. EXAM: NECK SOFT TISSUES - 1+ VIEW COMPARISON:  MRI 06/13/2016.  CT 06/10/2016. FINDINGS: Loss of normal cervical lordosis. No acute bony abnormality. Soft tissue structures are unremarkable. If symptoms remain contrast-enhanced neck CT can be obtained . Prior port catheter noted. Pulmonary apices are clear. IMPRESSION: 1. Loss of normal cervical lordosis. No acute or focal bony abnormality. 2. Soft tissue structures are unremarkable. Electronically Signed   By: Marcello Moores  Register   On: 06/28/2016 14:33   Dg Chest 2 View  Result Date: 06/26/2016 CLINICAL DATA:  Chest soreness for 1 day.  Sickle cell anemia. EXAM: CHEST  2 VIEW COMPARISON:  June 10, 2016 FINDINGS: Bony sclerosis, most prominent in the humeral head stomach consistent with known sickle cell. No pneumothorax. The heart size is mildly enlarged. The hila and mediastinum are normal. Mild opacity in the right mid lung not seen previously. No other acute pulmonary abnormalities. Right Port-A-Cath is in good position. IMPRESSION: 1. Right mid lung infiltrate.  Recommend follow-up to resolution. 2. Sickle cell changes to the bones. Electronically Signed   By: Dorise Bullion III M.D   On: 06/26/2016 20:19   Dg Thoracic Spine W/swimmers  Result Date: 06/10/2016 CLINICAL DATA:  Pain after fall. EXAM: THORACIC SPINE - 3 VIEWS COMPARISON:  None. FINDINGS:  There is no evidence of thoracic spine fracture. H-shaped thoracic vertebra consistent with history of sickle cell anemia. Alignment is normal. No other significant bone abnormalities are identified. Port catheter tip is seen in the lower superior vena cava. IMPRESSION: No acute osseous abnormality of the thoracic spine. Port catheter tip in the distal SVC. H-shaped vertebral bodies consistent with sickle-cell. Electronically Signed   By: Ashley Royalty M.D.   On: 06/10/2016 03:43   Dg Lumbar Spine Complete  Result Date: 06/10/2016 CLINICAL DATA:  Patient fell.  Syncope.  Back pain. EXAM: LUMBAR SPINE - COMPLETE 4+ VIEW COMPARISON:  None. FINDINGS: There is no evidence of lumbar spine fracture. Alignment is normal. Intervertebral disc spaces are maintained. H-shaped vertebral bodies throughout the visualized thoracolumbar spine would be in keeping  with vertebral body changes related to sickle cell anemia. Cholecystectomy. IMPRESSION: H-shaped vertebral bodies consistent with sickle cell anemia. No acute osseous abnormality. Electronically Signed   By: Ashley Royalty M.D.   On: 06/10/2016 03:39   Ct Head Wo Contrast  Result Date: 06/10/2016 CLINICAL DATA:  Sickle-cell crisis and migraine. Patient fell. Syncope. EXAM: CT HEAD WITHOUT CONTRAST CT CERVICAL SPINE WITHOUT CONTRAST TECHNIQUE: Multidetector CT imaging of the head and cervical spine was performed following the standard protocol without intravenous contrast. Multiplanar CT image reconstructions of the cervical spine were also generated. COMPARISON:  None. FINDINGS: CT HEAD FINDINGS Brain: No acute intracranial hemorrhage, large vascular territory infarction, edema or midline shift. Ventricles and sulci are within normal limits for age. Parafalcine calcifications are noted anteriorly. Vascular: No large vascular territory infarct. No hyperdense vessels. No unexpected calcifications. Skull: Thickened bony calvarium consistent with history of sickle cell  anemia. No acute fracture. Sinuses/Orbits: No acute finding. Other: None. CT CERVICAL SPINE FINDINGS Alignment: Normal Skull base and vertebrae: Thickened bony calvarium consistent with history of sickle cell. H-shaped vertebral bodies noted of the T1 and T2 vertebral bodies consistent with sickle cell. Soft tissues and spinal canal: No prevertebral fluid or swelling. No visible canal hematoma. Disc levels: No focal disc herniation. No significant neural foraminal encroachment. Upper chest: Negative. Other: None IMPRESSION: Thickened bony calvarium and and H-shaped upper thoracic vertebra consistent with sickle cell anemia. No acute intracranial nor cervical spinal abnormality. Electronically Signed   By: Ashley Royalty M.D.   On: 06/10/2016 03:49   Ct Angio Chest Pe W Or Wo Contrast  Result Date: 06/10/2016 CLINICAL DATA:  26 year old female with syncope. History of sickle cell anemia. EXAM: CT ANGIOGRAPHY CHEST WITH CONTRAST TECHNIQUE: Multidetector CT imaging of the chest was performed using the standard protocol during bolus administration of intravenous contrast. Multiplanar CT image reconstructions and MIPs were obtained to evaluate the vascular anatomy. CONTRAST:  48 cc Isovue 370 COMPARISON:  Chest radiograph dated 05/29/2016 and CT dated 02/26/2016 FINDINGS: Cardiovascular: There is mild cardiomegaly with slight interval increase in the size of the heart since the prior CT. No pericardial effusion. Right pectoral infusion catheter with tip in the right atrium. The thoracic aorta appears unremarkable. The origins of the great vessels of the aortic arch appear patent. Evaluation of the pulmonary arteries is limited due to suboptimal opacification and timing of the contrast. Apparent intraluminal low density involving the segmental branches of the right lower lobe (series 6 image 130) is indeterminate and may be artifactual and related to suboptimal enhancement of the vessel. Pulmonary embolus is not entirely  excluded. Correlation with clinical exam recommended. A V/Q scan may provide better evaluation if there is high clinical concern for acute pulmonary embolus. Mediastinum/Nodes: There is no hilar or mediastinal adenopathy. The esophagus is grossly unremarkable. No thyroid nodules identified. Lungs/Pleura: There is a stable 6 mm nodule or 2 adjacent nodules in the superior segment of the left lower lobe as seen on the prior CT. Right upper lobe linear atelectasis/scarring noted. The lungs are otherwise clear. There is no pleural effusion or pneumothorax. The central airways are patent. Upper Abdomen: Lobulated heterogeneous spleen similar to prior CT likely sequela of sickle cell disease. Musculoskeletal: There is central depression of the thoracic vertebra sequela of sickle cell disease. No acute fracture. Review of the MIP images confirms the above findings. IMPRESSION: This study is essentially nondiagnostic for evaluation of pulmonary embolism due to suboptimal opacification of the pulmonary arteries. Intraluminal low attenuating  area involving the segmental branches of the right lower lobe may be artifactual and related to suboptimal opacification of the these branches. Pulmonary embolus is not entirely excluded. Correlation with clinical exam recommended. V/Q scan may provide better evaluation if there is high clinical concern for acute pulmonary embolus. No focal pulmonary consolidation. Stable appearing left lower lobe pulmonary nodule. Mild cardiomegaly with slight interval increase in the cardiac size since the prior CT. Stable appearing heterogeneous and irregular spleen. Electronically Signed   By: Anner Crete M.D.   On: 06/10/2016 07:07   Ct Cervical Spine Wo Contrast  Result Date: 06/10/2016 CLINICAL DATA:  Sickle-cell crisis and migraine. Patient fell. Syncope. EXAM: CT HEAD WITHOUT CONTRAST CT CERVICAL SPINE WITHOUT CONTRAST TECHNIQUE: Multidetector CT imaging of the head and cervical spine  was performed following the standard protocol without intravenous contrast. Multiplanar CT image reconstructions of the cervical spine were also generated. COMPARISON:  None. FINDINGS: CT HEAD FINDINGS Brain: No acute intracranial hemorrhage, large vascular territory infarction, edema or midline shift. Ventricles and sulci are within normal limits for age. Parafalcine calcifications are noted anteriorly. Vascular: No large vascular territory infarct. No hyperdense vessels. No unexpected calcifications. Skull: Thickened bony calvarium consistent with history of sickle cell anemia. No acute fracture. Sinuses/Orbits: No acute finding. Other: None. CT CERVICAL SPINE FINDINGS Alignment: Normal Skull base and vertebrae: Thickened bony calvarium consistent with history of sickle cell. H-shaped vertebral bodies noted of the T1 and T2 vertebral bodies consistent with sickle cell. Soft tissues and spinal canal: No prevertebral fluid or swelling. No visible canal hematoma. Disc levels: No focal disc herniation. No significant neural foraminal encroachment. Upper chest: Negative. Other: None IMPRESSION: Thickened bony calvarium and and H-shaped upper thoracic vertebra consistent with sickle cell anemia. No acute intracranial nor cervical spinal abnormality. Electronically Signed   By: Ashley Royalty M.D.   On: 06/10/2016 03:49   Mr Jodene Nam Head Wo Contrast  Result Date: 06/13/2016 CLINICAL DATA:  26 y/o  F; sickle cell pain crisis with syncope. EXAM: MRA HEAD WITHOUT CONTRAST TECHNIQUE: Angiographic images of the Circle of Willis were obtained using MRA technique without intravenous contrast. COMPARISON:  06/10/2016 CT head. FINDINGS: Extensive motion artifact from the upper cervical through the circle of Willis to the level of foramen of [place]. Suboptimal assessment for stenosis or aneurysm. Bilateral internal carotid arteries, anterior cerebral arteries, and middle cerebral arteries are patent. Symmetric distal circulation.  Bilateral vertebral arteries, the basilar artery, and bilateral posterior cerebral arteries are patent. Symmetric distal circulation. IMPRESSION: Extensive motion artifact through the circle of Willis. No large vessel occlusion. Symmetric distal circulation. Electronically Signed   By: Kristine Garbe M.D.   On: 06/13/2016 19:21   Nm Pulmonary Perf And Vent  Result Date: 06/10/2016 CLINICAL DATA:  Syncope.  Sickle cell. EXAM: NUCLEAR MEDICINE VENTILATION - PERFUSION LUNG SCAN TECHNIQUE: Ventilation images were obtained in multiple projections using inhaled aerosol Tc-33m DTPA. Perfusion images were obtained in multiple projections after intravenous injection of Tc-106m MAA. RADIOPHARMACEUTICALS:  30.9 mCi Technetium-32m DTPA aerosol inhalation and 4.1 mCi Technetium-77m MAA IV COMPARISON:  Chest x-ray 06/10/2016.  Chest CT 06/10/2016 FINDINGS: No significant focal ventilation-perfusion mismatches noted. This is a low probability scan for pulmonary embolus . IMPRESSION: Low probability pulmonary embolus. Electronically Signed   By: Marcello Moores  Register   On: 06/10/2016 13:34   Dg Chest Port 1 View  Result Date: 06/10/2016 CLINICAL DATA:  Syncope.  History of sickle cell anemia. EXAM: PORTABLE CHEST 1 VIEW COMPARISON:  CT  06/10/2016 . FINDINGS: PowerPort catheter noted with lead tip in the superior vena cava. Mild cardiomegaly. No evidence of congestive heart failure. No pleural effusion or pneumothorax. No acute bony abnormality. Sclerotic changes of both humeral heads most likely from avascular necrosis. IMPRESSION: 1. PowerPort catheter noted with tip projected over superior vena cava. 2. Borderline cardiomegaly.  No CHF.  No acute pulmonary disease. 3.  Avascular necrosis both humeral heads. Electronically Signed   By: Marcello Moores  Register   On: 06/10/2016 13:32   Dg Humerus Left  Result Date: 06/10/2016 CLINICAL DATA:  Patient fell.  Sickle cell anemia. EXAM: LEFT HUMERUS - 2+ VIEW COMPARISON:   05/22/2016 left shoulder radiographs FINDINGS: Informed sclerotic appearance of the left humeral head consistent with osteonecrosis. No loss of the sphericity of the humeral head on current exam. No acute fracture nor bone destruction. IMPRESSION: Mild sclerosis of the humeral head consistent with osteonecrosis. No acute osseous abnormality. Electronically Signed   By: Ashley Royalty M.D.   On: 06/10/2016 03:41    Scheduled Meds: . HYDROmorphone   Intravenous Q4H  . hydroxyurea  1,000 mg Oral Daily  . ketorolac  30 mg Intravenous Q6H  . morphine  30 mg Oral Q12H  . pantoprazole  40 mg Oral Q0600  . rivaroxaban  20 mg Oral Q lunch  . senna-docusate  1 tablet Oral BID   Continuous Infusions:   Principal Problem:   Sickle cell crisis ([name]) Active Problems:   Chronic pain   Chronic anticoagulation   Infiltrate of lung present on imaging of chest    In excess of 35 minutes spent during this visit. Greater than 50% involved face to face contact with the patient for assessment, counseling and coordination of care.

## 2016-06-30 DIAGNOSIS — D638 Anemia in other chronic diseases classified elsewhere: Secondary | ICD-10-CM

## 2016-06-30 LAB — CBC WITH DIFFERENTIAL/PLATELET
BASOS ABS: 0.1 10*3/uL (ref 0.0–0.1)
BASOS PCT: 0 %
EOS ABS: 0.8 10*3/uL — AB (ref 0.0–0.7)
EOS PCT: 7 %
HCT: 18.8 % — ABNORMAL LOW (ref 36.0–46.0)
Hemoglobin: 6.6 g/dL — CL (ref 12.0–15.0)
Lymphocytes Relative: 12 %
Lymphs Abs: 1.4 10*3/uL (ref 0.7–4.0)
MCH: 31.6 pg (ref 26.0–34.0)
MCHC: 35.1 g/dL (ref 30.0–36.0)
MCV: 90 fL (ref 78.0–100.0)
MONO ABS: 1.1 10*3/uL — AB (ref 0.1–1.0)
MONOS PCT: 10 %
Neutro Abs: 8.3 10*3/uL — ABNORMAL HIGH (ref 1.7–7.7)
Neutrophils Relative %: 71 %
PLATELETS: 446 10*3/uL — AB (ref 150–400)
RBC: 2.09 MIL/uL — ABNORMAL LOW (ref 3.87–5.11)
RDW: 18.4 % — AB (ref 11.5–15.5)
WBC: 11.7 10*3/uL — ABNORMAL HIGH (ref 4.0–10.5)

## 2016-06-30 LAB — RETICULOCYTES
RBC.: 2.09 MIL/uL — AB (ref 3.87–5.11)
RETIC COUNT ABSOLUTE: 315.6 10*3/uL — AB (ref 19.0–186.0)
Retic Ct Pct: 15.1 % — ABNORMAL HIGH (ref 0.4–3.1)

## 2016-06-30 MED ORDER — HYDROMORPHONE HCL 2 MG/ML IJ SOLN
2.0000 mg | INTRAMUSCULAR | Status: DC | PRN
  Administered 2016-06-30 – 2016-07-02 (×10): 2 mg via INTRAVENOUS
  Filled 2016-06-30 (×10): qty 1

## 2016-06-30 MED ORDER — NORETHINDRONE ACETATE 5 MG PO TABS
10.0000 mg | ORAL_TABLET | Freq: Two times a day (BID) | ORAL | Status: DC
  Filled 2016-06-30: qty 2

## 2016-06-30 MED ORDER — MEDROXYPROGESTERONE ACETATE 10 MG PO TABS: 10.0000 mg | ORAL_TABLET | Freq: Every day | ORAL | Status: DC

## 2016-06-30 MED ORDER — SODIUM CHLORIDE 0.9 % IV SOLN
INTRAVENOUS | Status: DC | PRN
  Administered 2016-06-30 – 2016-07-01 (×3): via INTRAVENOUS

## 2016-06-30 MED ORDER — OXYCODONE HCL 5 MG PO TABS
15.0000 mg | ORAL_TABLET | ORAL | Status: DC
  Administered 2016-06-30 – 2016-07-02 (×13): 15 mg via ORAL
  Filled 2016-06-30 (×14): qty 3

## 2016-06-30 MED ORDER — NORETHINDRONE ACETATE 5 MG PO TABS
10.0000 mg | ORAL_TABLET | Freq: Four times a day (QID) | ORAL | Status: DC
  Administered 2016-06-30 – 2016-07-02 (×7): 10 mg via ORAL
  Filled 2016-06-30 (×9): qty 2

## 2016-06-30 NOTE — Progress Notes (Signed)
Called to patient room by NT at 1545.  Patient in bathroom - IVF pump channel is not working - lights flashing.  Also noted SPO2 channel to be turned off.  Spoke with patient and she stated that her SPO2 sensor got wet and she turned off the SPO2 channel. Drue Dun and I spoke with patient about the IV pump and she denied manipulating the IVF rate.  New bag of IVF 229ml was hung at 8AM and should have lasted until  Approximately 9PM.  RN Butch Penny changed bag around 1450PM.  Patient stated she did not know who unlocked the pump as it was locked when the new bag of IVF was hung.  We provided patient with new SPO2 sensor and exchanged the pump and channel that was not working and are sending to [employer].  We again confirmed with patient that she understood the only button she was to press was the PCA controller.  She should not be silencing the pump, etc.  Patient complained of alarm volume and this was decreased and she is to call the nurse when her IV is beeping so they can address.  IVF was programmed with 258ml volume to be infused and rate of 51ml/hr at 1600. IV pump also locked at this time.  Discussed with patients assigned Sandie Ano RN.

## 2016-06-30 NOTE — Progress Notes (Signed)
Agree with am nurse assessment. Will continue to monitor.

## 2016-06-30 NOTE — Progress Notes (Signed)
SICKLE CELL SERVICE PROGRESS NOTE  [name] [id number] DOB: [birth year] DOA: 06/26/2016 PCP: Angelica Chessman, MD  Assessment/Plan: Principal Problem:   Sickle cell crisis ([place]) Active Problems:   Chronic pain   Chronic anticoagulation   Infiltrate of lung present on imaging of chest  1. Hb SS with crisis: Will schedule Oxycodone and change clinician assisted doses to every 4 hours as needed. Continue PCA at current dose and continue ,Toradol and  IVF at [facility].   2. Menstrual Bleeding: Pt continues to have significant menstrual flow. Will start on Aygestin 10 mgm QID then taper as menstrual flow decreases.  3. Aphtous Ulcers: Resolved.  4. Left neck pain: Resolved without intervenetion 5. Leukocytosis: Pt has no clinical signs of infection. Will observe without antibiotics.  6. Acuter Blood Loss in the setting of Anemia of Chronic Disease:There has been a mild decrease in Hb but patient has a robust reticulocytosis. Will continue to observe.  7. Chronic pain: Continue MS Contin 30 mg q 12 hours.  8. Chronic Anticoagulation: Continue Xarelto. 9. H/O Cocaine Use: Pt reports that she has not used nay Cocaine since 05/17/2016. UDS negative for any THC or Cocaine.   Code Status: Full Code Family Communication: N/A Disposition Plan: Anticipate discharge in 24 hours.   Kaloni Bisaillon A.  Pager [phone]. If 7PM-7AM, please contact night-coverage.  06/30/2016, 12:39 PM  LOS: 4 days   Interim History: Pt reports that she no longer has any mouth or throat pain. Pt's last BM was yesterday. Pain rates pain at 7/10 and localized to her left upper back.  Consultants:  None  Procedures:  None  Antibiotics:  Azithromycin 2/11 >>2/12   Objective: Vitals:   06/30/16 0508 06/30/16 0817 06/30/16 1047 06/30/16 1142  BP: 126/69  96/65   Pulse: 98  100   Resp: 10 16 12 13   Temp: 98.7 F (37.1 C)  99.6 F (37.6 C)   TempSrc: Oral  Oral   SpO2: 100% 96% 96% 97%  Weight:  91.1 kg (200 lb 13.4 oz)     Height:       Weight change: 3.1 kg (6 lb 13.3 oz)  Intake/Output Summary (Last 24 hours) at 06/30/16 1239 Last data filed at 06/30/16 1049  Gross per 24 hour  Intake              374 ml  Output             1600 ml  Net            -1226 ml    General: Alert, awake, oriented x3, in mild distress due to pain.  HEENT: [place]/AT PEERL, EOMI, anicteric OROPHARYNX:  Moist, No exudate in posterior pharynx.  Heart: Regular rate and rhythm, without murmurs, rubs, gallops, PMI non-displaced, no heaves or thrills on palpation.  Lungs: Clear to auscultation, no wheezing or rhonchi noted. No increased vocal fremitus resonant to percussion  Abdomen: Soft, nontender, nondistended, positive bowel sounds, no masses no hepatosplenomegaly noted.  Neuro: No focal neurological deficits noted cranial nerves II through XII grossly intact.  Strength at functional baseline in bilateral upper and lower extremities. Musculoskeletal: No warmth swelling or erythema around joints, no spinal tenderness noted. Psychiatric: Patient alert and oriented x3. Affect much calmer today and patient appears composed.    Data Reviewed: Basic Metabolic Panel:  Recent Labs Lab 06/23/16 2143 06/26/16 1848 06/29/16 0458  NA 138 139 139  K 3.5 3.5 4.0  CL 107 109 110  CO2 24  23 25  GLUCOSE 125* 99 108*  BUN 12 11 9   CREATININE 0.72 1.05* 0.85  CALCIUM 8.9 8.7* 8.7*   Liver Function Tests:  Recent Labs Lab 06/23/16 2143 06/26/16 1848  AST 23 18  ALT 18 15  ALKPHOS 54 48  BILITOT 1.8* 1.6*  PROT 7.1 7.2  ALBUMIN 4.3 4.4   No results for input(s): LIPASE, AMYLASE in the last 168 hours. No results for input(s): AMMONIA in the last 168 hours. CBC:  Recent Labs Lab 06/23/16 2143 06/26/16 1848 06/27/16 0640 06/29/16 0458 06/30/16 0542  WBC 13.6* 11.0* 12.4* 16.3* 11.7*  NEUTROABS 8.2* 5.3 8.0* 11.1* 8.3*  HGB 8.5* 8.0* 8.1* 7.0* 6.6*  HCT 23.9* 23.4* 23.3* 19.5* 18.8*  MCV 89.8  89.0 88.9 89.9 90.0  PLT 509* 520* 513* 505* 446*   Cardiac Enzymes: No results for input(s): CKTOTAL, CKMB, CKMBINDEX, TROPONINI in the last 168 hours. BNP (last 3 results) No results for input(s): BNP in the last 8760 hours.  ProBNP (last 3 results) No results for input(s): PROBNP in the last 8760 hours.  CBG: No results for input(s): GLUCAP in the last 168 hours.  No results found for this or any previous visit (from the past 240 hour(s)).   Studies: Dg Neck Soft Tissue  Result Date: 06/28/2016 CLINICAL DATA:  Neck pain. EXAM: NECK SOFT TISSUES - 1+ VIEW COMPARISON:  MRI 06/13/2016.  CT 06/10/2016. FINDINGS: Loss of normal cervical lordosis. No acute bony abnormality. Soft tissue structures are unremarkable. If symptoms remain contrast-enhanced neck CT can be obtained . Prior port catheter noted. Pulmonary apices are clear. IMPRESSION: 1. Loss of normal cervical lordosis. No acute or focal bony abnormality. 2. Soft tissue structures are unremarkable. Electronically Signed   By: Marcello Moores  Register   On: 06/28/2016 14:33   Dg Chest 2 View  Result Date: 06/26/2016 CLINICAL DATA:  Chest soreness for 1 day.  Sickle cell anemia. EXAM: CHEST  2 VIEW COMPARISON:  June 10, 2016 FINDINGS: Bony sclerosis, most prominent in the humeral head stomach consistent with known sickle cell. No pneumothorax. The heart size is mildly enlarged. The hila and mediastinum are normal. Mild opacity in the right mid lung not seen previously. No other acute pulmonary abnormalities. Right Port-A-Cath is in good position. IMPRESSION: 1. Right mid lung infiltrate.  Recommend follow-up to resolution. 2. Sickle cell changes to the bones. Electronically Signed   By: Dorise Bullion III M.D   On: 06/26/2016 20:19   Dg Thoracic Spine W/swimmers  Result Date: 06/10/2016 CLINICAL DATA:  Pain after fall. EXAM: THORACIC SPINE - 3 VIEWS COMPARISON:  None. FINDINGS: There is no evidence of thoracic spine fracture. H-shaped  thoracic vertebra consistent with history of sickle cell anemia. Alignment is normal. No other significant bone abnormalities are identified. Port catheter tip is seen in the lower superior vena cava. IMPRESSION: No acute osseous abnormality of the thoracic spine. Port catheter tip in the distal SVC. H-shaped vertebral bodies consistent with sickle-cell. Electronically Signed   By: Ashley Royalty M.D.   On: 06/10/2016 03:43   Dg Lumbar Spine Complete  Result Date: 06/10/2016 CLINICAL DATA:  Patient fell.  Syncope.  Back pain. EXAM: LUMBAR SPINE - COMPLETE 4+ VIEW COMPARISON:  None. FINDINGS: There is no evidence of lumbar spine fracture. Alignment is normal. Intervertebral disc spaces are maintained. H-shaped vertebral bodies throughout the visualized thoracolumbar spine would be in keeping with vertebral body changes related to sickle cell anemia. Cholecystectomy. IMPRESSION: H-shaped vertebral bodies consistent with  sickle cell anemia. No acute osseous abnormality. Electronically Signed   By: Ashley Royalty M.D.   On: 06/10/2016 03:39   Ct Head Wo Contrast  Result Date: 06/10/2016 CLINICAL DATA:  Sickle-cell crisis and migraine. Patient fell. Syncope. EXAM: CT HEAD WITHOUT CONTRAST CT CERVICAL SPINE WITHOUT CONTRAST TECHNIQUE: Multidetector CT imaging of the head and cervical spine was performed following the standard protocol without intravenous contrast. Multiplanar CT image reconstructions of the cervical spine were also generated. COMPARISON:  None. FINDINGS: CT HEAD FINDINGS Brain: No acute intracranial hemorrhage, large vascular territory infarction, edema or midline shift. Ventricles and sulci are within normal limits for age. Parafalcine calcifications are noted anteriorly. Vascular: No large vascular territory infarct. No hyperdense vessels. No unexpected calcifications. Skull: Thickened bony calvarium consistent with history of sickle cell anemia. No acute fracture. Sinuses/Orbits: No acute finding.  Other: None. CT CERVICAL SPINE FINDINGS Alignment: Normal Skull base and vertebrae: Thickened bony calvarium consistent with history of sickle cell. H-shaped vertebral bodies noted of the T1 and T2 vertebral bodies consistent with sickle cell. Soft tissues and spinal canal: No prevertebral fluid or swelling. No visible canal hematoma. Disc levels: No focal disc herniation. No significant neural foraminal encroachment. Upper chest: Negative. Other: None IMPRESSION: Thickened bony calvarium and and H-shaped upper thoracic vertebra consistent with sickle cell anemia. No acute intracranial nor cervical spinal abnormality. Electronically Signed   By: Ashley Royalty M.D.   On: 06/10/2016 03:49   Ct Angio Chest Pe W Or Wo Contrast  Result Date: 06/10/2016 CLINICAL DATA:  26 year old female with syncope. History of sickle cell anemia. EXAM: CT ANGIOGRAPHY CHEST WITH CONTRAST TECHNIQUE: Multidetector CT imaging of the chest was performed using the standard protocol during bolus administration of intravenous contrast. Multiplanar CT image reconstructions and MIPs were obtained to evaluate the vascular anatomy. CONTRAST:  48 cc Isovue 370 COMPARISON:  Chest radiograph dated 05/29/2016 and CT dated 02/26/2016 FINDINGS: Cardiovascular: There is mild cardiomegaly with slight interval increase in the size of the heart since the prior CT. No pericardial effusion. Right pectoral infusion catheter with tip in the right atrium. The thoracic aorta appears unremarkable. The origins of the great vessels of the aortic arch appear patent. Evaluation of the pulmonary arteries is limited due to suboptimal opacification and timing of the contrast. Apparent intraluminal low density involving the segmental branches of the right lower lobe (series 6 image 130) is indeterminate and may be artifactual and related to suboptimal enhancement of the vessel. Pulmonary embolus is not entirely excluded. Correlation with clinical exam recommended. A V/Q  scan may provide better evaluation if there is high clinical concern for acute pulmonary embolus. Mediastinum/Nodes: There is no hilar or mediastinal adenopathy. The esophagus is grossly unremarkable. No thyroid nodules identified. Lungs/Pleura: There is a stable 6 mm nodule or 2 adjacent nodules in the superior segment of the left lower lobe as seen on the prior CT. Right upper lobe linear atelectasis/scarring noted. The lungs are otherwise clear. There is no pleural effusion or pneumothorax. The central airways are patent. Upper Abdomen: Lobulated heterogeneous spleen similar to prior CT likely sequela of sickle cell disease. Musculoskeletal: There is central depression of the thoracic vertebra sequela of sickle cell disease. No acute fracture. Review of the MIP images confirms the above findings. IMPRESSION: This study is essentially nondiagnostic for evaluation of pulmonary embolism due to suboptimal opacification of the pulmonary arteries. Intraluminal low attenuating area involving the segmental branches of the right lower lobe may be artifactual and related to  suboptimal opacification of the these branches. Pulmonary embolus is not entirely excluded. Correlation with clinical exam recommended. V/Q scan may provide better evaluation if there is high clinical concern for acute pulmonary embolus. No focal pulmonary consolidation. Stable appearing left lower lobe pulmonary nodule. Mild cardiomegaly with slight interval increase in the cardiac size since the prior CT. Stable appearing heterogeneous and irregular spleen. Electronically Signed   By: Anner Crete M.D.   On: 06/10/2016 07:07   Ct Cervical Spine Wo Contrast  Result Date: 06/10/2016 CLINICAL DATA:  Sickle-cell crisis and migraine. Patient fell. Syncope. EXAM: CT HEAD WITHOUT CONTRAST CT CERVICAL SPINE WITHOUT CONTRAST TECHNIQUE: Multidetector CT imaging of the head and cervical spine was performed following the standard protocol without  intravenous contrast. Multiplanar CT image reconstructions of the cervical spine were also generated. COMPARISON:  None. FINDINGS: CT HEAD FINDINGS Brain: No acute intracranial hemorrhage, large vascular territory infarction, edema or midline shift. Ventricles and sulci are within normal limits for age. Parafalcine calcifications are noted anteriorly. Vascular: No large vascular territory infarct. No hyperdense vessels. No unexpected calcifications. Skull: Thickened bony calvarium consistent with history of sickle cell anemia. No acute fracture. Sinuses/Orbits: No acute finding. Other: None. CT CERVICAL SPINE FINDINGS Alignment: Normal Skull base and vertebrae: Thickened bony calvarium consistent with history of sickle cell. H-shaped vertebral bodies noted of the T1 and T2 vertebral bodies consistent with sickle cell. Soft tissues and spinal canal: No prevertebral fluid or swelling. No visible canal hematoma. Disc levels: No focal disc herniation. No significant neural foraminal encroachment. Upper chest: Negative. Other: None IMPRESSION: Thickened bony calvarium and and H-shaped upper thoracic vertebra consistent with sickle cell anemia. No acute intracranial nor cervical spinal abnormality. Electronically Signed   By: Ashley Royalty M.D.   On: 06/10/2016 03:49   Mr Jodene Nam Head Wo Contrast  Result Date: 06/13/2016 CLINICAL DATA:  26 y/o  F; sickle cell pain crisis with syncope. EXAM: MRA HEAD WITHOUT CONTRAST TECHNIQUE: Angiographic images of the Circle of Willis were obtained using MRA technique without intravenous contrast. COMPARISON:  06/10/2016 CT head. FINDINGS: Extensive motion artifact from the upper cervical through the circle of Willis to the level of foramen of [place]. Suboptimal assessment for stenosis or aneurysm. Bilateral internal carotid arteries, anterior cerebral arteries, and middle cerebral arteries are patent. Symmetric distal circulation. Bilateral vertebral arteries, the basilar artery, and  bilateral posterior cerebral arteries are patent. Symmetric distal circulation. IMPRESSION: Extensive motion artifact through the circle of Willis. No large vessel occlusion. Symmetric distal circulation. Electronically Signed   By: Kristine Garbe M.D.   On: 06/13/2016 19:21   Nm Pulmonary Perf And Vent  Result Date: 06/10/2016 CLINICAL DATA:  Syncope.  Sickle cell. EXAM: NUCLEAR MEDICINE VENTILATION - PERFUSION LUNG SCAN TECHNIQUE: Ventilation images were obtained in multiple projections using inhaled aerosol Tc-40m DTPA. Perfusion images were obtained in multiple projections after intravenous injection of Tc-1m MAA. RADIOPHARMACEUTICALS:  30.9 mCi Technetium-64m DTPA aerosol inhalation and 4.1 mCi Technetium-69m MAA IV COMPARISON:  Chest x-ray 06/10/2016.  Chest CT 06/10/2016 FINDINGS: No significant focal ventilation-perfusion mismatches noted. This is a low probability scan for pulmonary embolus . IMPRESSION: Low probability pulmonary embolus. Electronically Signed   By: Marcello Moores  Register   On: 06/10/2016 13:34   Dg Chest Port 1 View  Result Date: 06/10/2016 CLINICAL DATA:  Syncope.  History of sickle cell anemia. EXAM: PORTABLE CHEST 1 VIEW COMPARISON:  CT 06/10/2016 . FINDINGS: PowerPort catheter noted with lead tip in the superior vena cava. Mild cardiomegaly.  No evidence of congestive heart failure. No pleural effusion or pneumothorax. No acute bony abnormality. Sclerotic changes of both humeral heads most likely from avascular necrosis. IMPRESSION: 1. PowerPort catheter noted with tip projected over superior vena cava. 2. Borderline cardiomegaly.  No CHF.  No acute pulmonary disease. 3.  Avascular necrosis both humeral heads. Electronically Signed   By: Marcello Moores  Register   On: 06/10/2016 13:32   Dg Humerus Left  Result Date: 06/10/2016 CLINICAL DATA:  Patient fell.  Sickle cell anemia. EXAM: LEFT HUMERUS - 2+ VIEW COMPARISON:  05/22/2016 left shoulder radiographs FINDINGS: Informed  sclerotic appearance of the left humeral head consistent with osteonecrosis. No loss of the sphericity of the humeral head on current exam. No acute fracture nor bone destruction. IMPRESSION: Mild sclerosis of the humeral head consistent with osteonecrosis. No acute osseous abnormality. Electronically Signed   By: Ashley Royalty M.D.   On: 06/10/2016 03:41    Scheduled Meds: . HYDROmorphone   Intravenous Q4H  . hydroxyurea  1,000 mg Oral Daily  . ketorolac  30 mg Intravenous Q6H  . morphine  30 mg Oral Q12H  . norethindrone  10 mg Oral QID  . oxyCODONE  15 mg Oral Q4H  . pantoprazole  40 mg Oral Q0600  . rivaroxaban  20 mg Oral Q lunch  . senna-docusate  1 tablet Oral BID   Continuous Infusions:   Principal Problem:   Sickle cell crisis ([name]) Active Problems:   Chronic pain   Chronic anticoagulation   Infiltrate of lung present on imaging of chest    In excess of 25 minutes spent during this visit. Greater than 50% involved face to face contact with the patient for assessment, counseling and coordination of care.

## 2016-06-30 NOTE — Progress Notes (Signed)
   06/30/16 1700  Clinical Encounter Type  Visited With Patient  Visit Type Psychological support  Counseling intern visited with patient to offer emotional support. Patient appeared alert, oriented x3, and engaged clearly with counselor throughout visit. Patient reported she was doing well emotionally, that she has recently made a number of positive life changes, including ending a relationship and devoting more time to her spiritual growth. Patient also described education and career goals that she hopes will eventually lead to financial independence. Patient described changing some of her medical providers, whom she felt were penalizing her for past behaviors, especially related to her use of cocaine. Patient reported that she felt her former providers were trying to force her to go to rehab for her drug use, which patient refused as she felt her drug use was not so much of a problem that she needed rehab. Patient indicated she is relying on God to help her stay off drugs. Patient reported that she listens to gospel music to keep her from saying hurtful things when feels she is being confronted, and she reads and journals about Bible passages twice daily to relieve stress. Counselor affirmed patient's coping strategies and positive outlook.  Lamount Cohen, Counseling Intern Supervisor - Scientist, research (physical sciences)

## 2016-06-30 NOTE — Significant Event (Signed)
CRITICAL VALUE ALERT  Critical value received:  Hemoglobin 6.6  Date of notification:  06/30/2016  Time of notification:  0615 yes Critical value read back:  Nurse who received alert: Shannon,rn  MD notified (1st page):  Belenda Cruise  schorr,NP  Time of first page:  0625  MD notified (2nd page):  Time of second page:  Responding MD:  Fransisca Connors  Time MD responded:  [phone]

## 2016-07-01 NOTE — Progress Notes (Signed)
Nursing Note: 11.9 mg cleared from pca pump.Resp-14 PO2 100 % on r/a.End tidal co2 30.Pt awake alert.wbb

## 2016-07-01 NOTE — Progress Notes (Signed)
At beginning of shift this RN cleared the infused volume on channel A that is the channel for her NS at [facility]. This RN has kept pump locked thorough out the shift. Pt's pump has been at 36ml/hr each time this RN has entered the room and a new 276ml bag of NS was hung in the 2200 hour of 06/30/16. At 0330 it was noted that NS bag was more then 2/3s of the amount transfused. Pump showed a volume infused amount of 305.63ml since 1910. When questioned, pt denies any understanding of why the volume infused is almost twice what should be infused. She didn't have an issue with her tubing be disconnected however that doesn't explain the increase in volume infused on the pump itself. Charge RN made aware at this time. Will continue to monitor.

## 2016-07-01 NOTE — Progress Notes (Signed)
SICKLE CELL SERVICE PROGRESS NOTE  [name] [id number] DOB: 17-Mar-1991 DOA: 06/26/2016 PCP: Angelica Chessman, MD  Assessment/Plan: Principal Problem:   Sickle cell crisis ([place]) Active Problems:   Chronic pain   Chronic anticoagulation   Infiltrate of lung present on imaging of chest  1. Hb SS with crisis: Will continue PCA current dose with Toradol and continue IVF at [facility].  Will continue clinician assisted doses. She has been Counseled on using her PCA and has been reportedly tampering with it. I have counseled her and informed her that she will be discharged if found to be tampering with the machine again.  2. Aphtous Ulcers: No complaint. Eating and drinking well. She is refusing magic mouthwash currently. 3. Menstrual Blood Loss: Will continue to hold on any steroid intervention as flow decreasing and pain improving.  4. Left neck pain: Resolved without intervention 5. Leukocytosis: Pt has no clinical signs of infection. Will observe without antibiotics.  6. Anemia of Chronic Disease: Hb at baseline. Will observe.  7. Chronic pain:  Continue MS Contin 30 mg q 12 hours.  8. Chronic Anticoagulation: Continue Xarelto. 9. H/O Cocaine Use: Pt reports that she has not used nay Cocaine since 05/17/2016. UDS negative for any THC or Cocaine.   Code Status: Full Code Family Communication: N/A Disposition Plan: Anticipate discharge in 48 hours.   [facility]  Pager [phone]. If 7PM-7AM, please contact night-coverage.  07/01/2016, 5:45 PM  LOS: 5 days   Interim History: Pt reports no complaint.  Pain rates pain at 8/10 and localized to her left upper back. Has used 32.9 mg with 62/47 Demands/Delivery in the last 24 hours  Consultants:  None  Procedures:  None  Antibiotics:  Azithromycin 2/11 >>2/12   Objective: Vitals:   07/01/16 1030 07/01/16 1235 07/01/16 1331 07/01/16 1550  BP:   107/66   Pulse:   90   Resp: 16 16 12 11   Temp:      TempSrc:      SpO2: 96%  97% 100%   Weight:      Height:       Weight change:   Intake/Output Summary (Last 24 hours) at 07/01/16 1745 Last data filed at 07/01/16 1500  Gross per 24 hour  Intake          1287.33 ml  Output             2650 ml  Net         -1362.67 ml    General: Alert, awake, oriented x3, in moderately acute distress. Crying stating that she is frustrated.  HEENT: Chicken/AT PEERL, EOMI, anicteric OROPHARYNX:  Moist, No exudate in posterior pharynx.  Heart: Regular rate and rhythm, without murmurs, rubs, gallops, PMI non-displaced, no heaves or thrills on palpation.  Lungs: Clear to auscultation, no wheezing or rhonchi noted. No increased vocal fremitus resonant to percussion  Abdomen: Soft, nontender, nondistended, positive bowel sounds, no masses no hepatosplenomegaly noted.  Neuro: No focal neurological deficits noted cranial nerves II through XII grossly intact.  Strength at functional baseline in bilateral upper and lower extremities. Musculoskeletal: No warmth swelling or erythema around joints, no spinal tenderness noted. Psychiatric: Patient alert and oriented x3   Data Reviewed: Basic Metabolic Panel:  Recent Labs Lab 06/26/16 1848 06/29/16 0458  NA 139 139  K 3.5 4.0  CL 109 110  CO2 23 25  GLUCOSE 99 108*  BUN 11 9  CREATININE 1.05* 0.85  CALCIUM 8.7* 8.7*   Liver Function  Tests:  Recent Labs Lab 06/26/16 1848  AST 18  ALT 15  ALKPHOS 48  BILITOT 1.6*  PROT 7.2  ALBUMIN 4.4   No results for input(s): LIPASE, AMYLASE in the last 168 hours. No results for input(s): AMMONIA in the last 168 hours. CBC:  Recent Labs Lab 06/26/16 1848 06/27/16 0640 06/29/16 0458 06/30/16 0542  WBC 11.0* 12.4* 16.3* 11.7*  NEUTROABS 5.3 8.0* 11.1* 8.3*  HGB 8.0* 8.1* 7.0* 6.6*  HCT 23.4* 23.3* 19.5* 18.8*  MCV 89.0 88.9 89.9 90.0  PLT 520* 513* 505* 446*   Cardiac Enzymes: No results for input(s): CKTOTAL, CKMB, CKMBINDEX, TROPONINI in the last 168 hours. BNP (last 3  results) No results for input(s): BNP in the last 8760 hours.  ProBNP (last 3 results) No results for input(s): PROBNP in the last 8760 hours.  CBG: No results for input(s): GLUCAP in the last 168 hours.  No results found for this or any previous visit (from the past 240 hour(s)).   Studies: Dg Neck Soft Tissue  Result Date: 06/28/2016 CLINICAL DATA:  Neck pain. EXAM: NECK SOFT TISSUES - 1+ VIEW COMPARISON:  MRI 06/13/2016.  CT 06/10/2016. FINDINGS: Loss of normal cervical lordosis. No acute bony abnormality. Soft tissue structures are unremarkable. If symptoms remain contrast-enhanced neck CT can be obtained . Prior port catheter noted. Pulmonary apices are clear. IMPRESSION: 1. Loss of normal cervical lordosis. No acute or focal bony abnormality. 2. Soft tissue structures are unremarkable. Electronically Signed   By: Marcello Moores  Register   On: 06/28/2016 14:33   Dg Chest 2 View  Result Date: 06/26/2016 CLINICAL DATA:  Chest soreness for 1 day.  Sickle cell anemia. EXAM: CHEST  2 VIEW COMPARISON:  June 10, 2016 FINDINGS: Bony sclerosis, most prominent in the humeral head stomach consistent with known sickle cell. No pneumothorax. The heart size is mildly enlarged. The hila and mediastinum are normal. Mild opacity in the right mid lung not seen previously. No other acute pulmonary abnormalities. Right Port-A-Cath is in good position. IMPRESSION: 1. Right mid lung infiltrate.  Recommend follow-up to resolution. 2. Sickle cell changes to the bones. Electronically Signed   By: Dorise Bullion III M.D   On: 06/26/2016 20:19   Dg Thoracic Spine W/swimmers  Result Date: 06/10/2016 CLINICAL DATA:  Pain after fall. EXAM: THORACIC SPINE - 3 VIEWS COMPARISON:  None. FINDINGS: There is no evidence of thoracic spine fracture. H-shaped thoracic vertebra consistent with history of sickle cell anemia. Alignment is normal. No other significant bone abnormalities are identified. Port catheter tip is seen in the  lower superior vena cava. IMPRESSION: No acute osseous abnormality of the thoracic spine. Port catheter tip in the distal SVC. H-shaped vertebral bodies consistent with sickle-cell. Electronically Signed   By: Ashley Royalty M.D.   On: 06/10/2016 03:43   Dg Lumbar Spine Complete  Result Date: 06/10/2016 CLINICAL DATA:  Patient fell.  Syncope.  Back pain. EXAM: LUMBAR SPINE - COMPLETE 4+ VIEW COMPARISON:  None. FINDINGS: There is no evidence of lumbar spine fracture. Alignment is normal. Intervertebral disc spaces are maintained. H-shaped vertebral bodies throughout the visualized thoracolumbar spine would be in keeping with vertebral body changes related to sickle cell anemia. Cholecystectomy. IMPRESSION: H-shaped vertebral bodies consistent with sickle cell anemia. No acute osseous abnormality. Electronically Signed   By: Ashley Royalty M.D.   On: 06/10/2016 03:39   Ct Head Wo Contrast  Result Date: 06/10/2016 CLINICAL DATA:  Sickle-cell crisis and migraine. Patient fell. Syncope. EXAM:  CT HEAD WITHOUT CONTRAST CT CERVICAL SPINE WITHOUT CONTRAST TECHNIQUE: Multidetector CT imaging of the head and cervical spine was performed following the standard protocol without intravenous contrast. Multiplanar CT image reconstructions of the cervical spine were also generated. COMPARISON:  None. FINDINGS: CT HEAD FINDINGS Brain: No acute intracranial hemorrhage, large vascular territory infarction, edema or midline shift. Ventricles and sulci are within normal limits for age. Parafalcine calcifications are noted anteriorly. Vascular: No large vascular territory infarct. No hyperdense vessels. No unexpected calcifications. Skull: Thickened bony calvarium consistent with history of sickle cell anemia. No acute fracture. Sinuses/Orbits: No acute finding. Other: None. CT CERVICAL SPINE FINDINGS Alignment: Normal Skull base and vertebrae: Thickened bony calvarium consistent with history of sickle cell. H-shaped vertebral bodies  noted of the T1 and T2 vertebral bodies consistent with sickle cell. Soft tissues and spinal canal: No prevertebral fluid or swelling. No visible canal hematoma. Disc levels: No focal disc herniation. No significant neural foraminal encroachment. Upper chest: Negative. Other: None IMPRESSION: Thickened bony calvarium and and H-shaped upper thoracic vertebra consistent with sickle cell anemia. No acute intracranial nor cervical spinal abnormality. Electronically Signed   By: Ashley Royalty M.D.   On: 06/10/2016 03:49   Ct Angio Chest Pe W Or Wo Contrast  Result Date: 06/10/2016 CLINICAL DATA:  26 year old female with syncope. History of sickle cell anemia. EXAM: CT ANGIOGRAPHY CHEST WITH CONTRAST TECHNIQUE: Multidetector CT imaging of the chest was performed using the standard protocol during bolus administration of intravenous contrast. Multiplanar CT image reconstructions and MIPs were obtained to evaluate the vascular anatomy. CONTRAST:  48 cc Isovue 370 COMPARISON:  Chest radiograph dated 05/29/2016 and CT dated 02/26/2016 FINDINGS: Cardiovascular: There is mild cardiomegaly with slight interval increase in the size of the heart since the prior CT. No pericardial effusion. Right pectoral infusion catheter with tip in the right atrium. The thoracic aorta appears unremarkable. The origins of the great vessels of the aortic arch appear patent. Evaluation of the pulmonary arteries is limited due to suboptimal opacification and timing of the contrast. Apparent intraluminal low density involving the segmental branches of the right lower lobe (series 6 image 130) is indeterminate and may be artifactual and related to suboptimal enhancement of the vessel. Pulmonary embolus is not entirely excluded. Correlation with clinical exam recommended. A V/Q scan may provide better evaluation if there is high clinical concern for acute pulmonary embolus. Mediastinum/Nodes: There is no hilar or mediastinal adenopathy. The esophagus  is grossly unremarkable. No thyroid nodules identified. Lungs/Pleura: There is a stable 6 mm nodule or 2 adjacent nodules in the superior segment of the left lower lobe as seen on the prior CT. Right upper lobe linear atelectasis/scarring noted. The lungs are otherwise clear. There is no pleural effusion or pneumothorax. The central airways are patent. Upper Abdomen: Lobulated heterogeneous spleen similar to prior CT likely sequela of sickle cell disease. Musculoskeletal: There is central depression of the thoracic vertebra sequela of sickle cell disease. No acute fracture. Review of the MIP images confirms the above findings. IMPRESSION: This study is essentially nondiagnostic for evaluation of pulmonary embolism due to suboptimal opacification of the pulmonary arteries. Intraluminal low attenuating area involving the segmental branches of the right lower lobe may be artifactual and related to suboptimal opacification of the these branches. Pulmonary embolus is not entirely excluded. Correlation with clinical exam recommended. V/Q scan may provide better evaluation if there is high clinical concern for acute pulmonary embolus. No focal pulmonary consolidation. Stable appearing left lower lobe  pulmonary nodule. Mild cardiomegaly with slight interval increase in the cardiac size since the prior CT. Stable appearing heterogeneous and irregular spleen. Electronically Signed   By: Anner Crete M.D.   On: 06/10/2016 07:07   Ct Cervical Spine Wo Contrast  Result Date: 06/10/2016 CLINICAL DATA:  Sickle-cell crisis and migraine. Patient fell. Syncope. EXAM: CT HEAD WITHOUT CONTRAST CT CERVICAL SPINE WITHOUT CONTRAST TECHNIQUE: Multidetector CT imaging of the head and cervical spine was performed following the standard protocol without intravenous contrast. Multiplanar CT image reconstructions of the cervical spine were also generated. COMPARISON:  None. FINDINGS: CT HEAD FINDINGS Brain: No acute intracranial  hemorrhage, large vascular territory infarction, edema or midline shift. Ventricles and sulci are within normal limits for age. Parafalcine calcifications are noted anteriorly. Vascular: No large vascular territory infarct. No hyperdense vessels. No unexpected calcifications. Skull: Thickened bony calvarium consistent with history of sickle cell anemia. No acute fracture. Sinuses/Orbits: No acute finding. Other: None. CT CERVICAL SPINE FINDINGS Alignment: Normal Skull base and vertebrae: Thickened bony calvarium consistent with history of sickle cell. H-shaped vertebral bodies noted of the T1 and T2 vertebral bodies consistent with sickle cell. Soft tissues and spinal canal: No prevertebral fluid or swelling. No visible canal hematoma. Disc levels: No focal disc herniation. No significant neural foraminal encroachment. Upper chest: Negative. Other: None IMPRESSION: Thickened bony calvarium and and H-shaped upper thoracic vertebra consistent with sickle cell anemia. No acute intracranial nor cervical spinal abnormality. Electronically Signed   By: Ashley Royalty M.D.   On: 06/10/2016 03:49   Mr Jodene Nam Head Wo Contrast  Result Date: 06/13/2016 CLINICAL DATA:  26 y/o  F; sickle cell pain crisis with syncope. EXAM: MRA HEAD WITHOUT CONTRAST TECHNIQUE: Angiographic images of the Circle of Willis were obtained using MRA technique without intravenous contrast. COMPARISON:  06/10/2016 CT head. FINDINGS: Extensive motion artifact from the upper cervical through the circle of Willis to the level of foramen of [place]. Suboptimal assessment for stenosis or aneurysm. Bilateral internal carotid arteries, anterior cerebral arteries, and middle cerebral arteries are patent. Symmetric distal circulation. Bilateral vertebral arteries, the basilar artery, and bilateral posterior cerebral arteries are patent. Symmetric distal circulation. IMPRESSION: Extensive motion artifact through the circle of Willis. No large vessel occlusion.  Symmetric distal circulation. Electronically Signed   By: Kristine Garbe M.D.   On: 06/13/2016 19:21   Nm Pulmonary Perf And Vent  Result Date: 06/10/2016 CLINICAL DATA:  Syncope.  Sickle cell. EXAM: NUCLEAR MEDICINE VENTILATION - PERFUSION LUNG SCAN TECHNIQUE: Ventilation images were obtained in multiple projections using inhaled aerosol Tc-84m DTPA. Perfusion images were obtained in multiple projections after intravenous injection of Tc-60m MAA. RADIOPHARMACEUTICALS:  30.9 mCi Technetium-23m DTPA aerosol inhalation and 4.1 mCi Technetium-47m MAA IV COMPARISON:  Chest x-ray 06/10/2016.  Chest CT 06/10/2016 FINDINGS: No significant focal ventilation-perfusion mismatches noted. This is a low probability scan for pulmonary embolus . IMPRESSION: Low probability pulmonary embolus. Electronically Signed   By: Marcello Moores  Register   On: 06/10/2016 13:34   Dg Chest Port 1 View  Result Date: 06/10/2016 CLINICAL DATA:  Syncope.  History of sickle cell anemia. EXAM: PORTABLE CHEST 1 VIEW COMPARISON:  CT 06/10/2016 . FINDINGS: PowerPort catheter noted with lead tip in the superior vena cava. Mild cardiomegaly. No evidence of congestive heart failure. No pleural effusion or pneumothorax. No acute bony abnormality. Sclerotic changes of both humeral heads most likely from avascular necrosis. IMPRESSION: 1. PowerPort catheter noted with tip projected over superior vena cava. 2. Borderline cardiomegaly.  No CHF.  No acute pulmonary disease. 3.  Avascular necrosis both humeral heads. Electronically Signed   By: Marcello Moores  Register   On: 06/10/2016 13:32   Dg Humerus Left  Result Date: 06/10/2016 CLINICAL DATA:  Patient fell.  Sickle cell anemia. EXAM: LEFT HUMERUS - 2+ VIEW COMPARISON:  05/22/2016 left shoulder radiographs FINDINGS: Informed sclerotic appearance of the left humeral head consistent with osteonecrosis. No loss of the sphericity of the humeral head on current exam. No acute fracture nor bone destruction.  IMPRESSION: Mild sclerosis of the humeral head consistent with osteonecrosis. No acute osseous abnormality. Electronically Signed   By: Ashley Royalty M.D.   On: 06/10/2016 03:41    Scheduled Meds: . HYDROmorphone   Intravenous Q4H  . hydroxyurea  1,000 mg Oral Daily  . ketorolac  30 mg Intravenous Q6H  . morphine  30 mg Oral Q12H  . norethindrone  10 mg Oral QID  . oxyCODONE  15 mg Oral Q4H  . pantoprazole  40 mg Oral Q0600  . rivaroxaban  20 mg Oral Q lunch  . senna-docusate  1 tablet Oral BID   Continuous Infusions:   Principal Problem:   Sickle cell crisis ([name]) Active Problems:   Chronic pain   Chronic anticoagulation   Infiltrate of lung present on imaging of chest    In excess of 35 minutes spent during this visit. Greater than 50% involved face to face contact with the patient for assessment, counseling and coordination of care.

## 2016-07-01 NOTE — Progress Notes (Signed)
Pt used 3 maxi pads during the night. Bright red blood that was minimal in amount. RN for today made aware.

## 2016-07-01 NOTE — Care Management Important Message (Signed)
Important Message  Patient Details  Name: [name] MRN: [id number] Date of Birth: [birth year]   Medicare Important Message Given:  Yes    Kerin Salen 07/01/2016, 11:23 [place] Message  Patient Details  Name: [name] MRN: [id number] Date of Birth: 06-Dec-1990   Medicare Important Message Given:  Yes    Kerin Salen 07/01/2016, 11:22 AM

## 2016-07-02 LAB — CBC
HEMATOCRIT: 17.4 % — AB (ref 36.0–46.0)
Hemoglobin: 6.2 g/dL — CL (ref 12.0–15.0)
MCH: 32 pg (ref 26.0–34.0)
MCHC: 35.6 g/dL (ref 30.0–36.0)
MCV: 89.7 fL (ref 78.0–100.0)
Platelets: 490 10*3/uL — ABNORMAL HIGH (ref 150–400)
RBC: 1.94 MIL/uL — ABNORMAL LOW (ref 3.87–5.11)
RDW: 18.2 % — AB (ref 11.5–15.5)
WBC: 12.4 10*3/uL — ABNORMAL HIGH (ref 4.0–10.5)

## 2016-07-02 IMAGING — US US MFM OB FOLLOW-UP
1 series · 14 of 28 positions shown · non-contrast
Comparison: none

[Series 1: us mfm ob follow-up · 33 acquisitions, 14 frames shown]
[im 2/33]
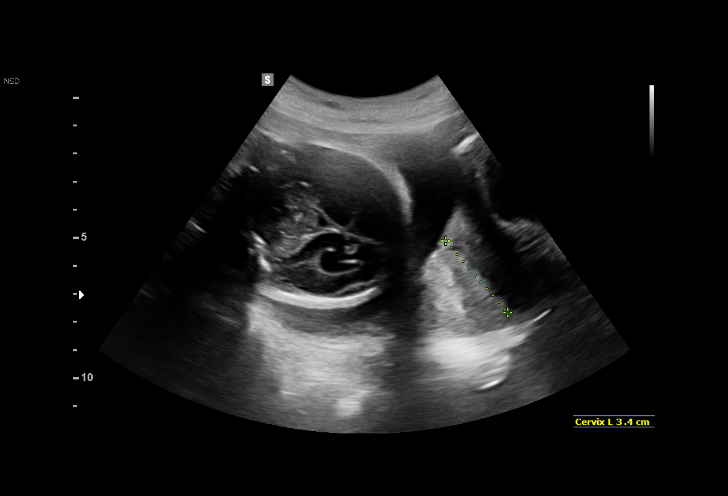
[im 4/33]
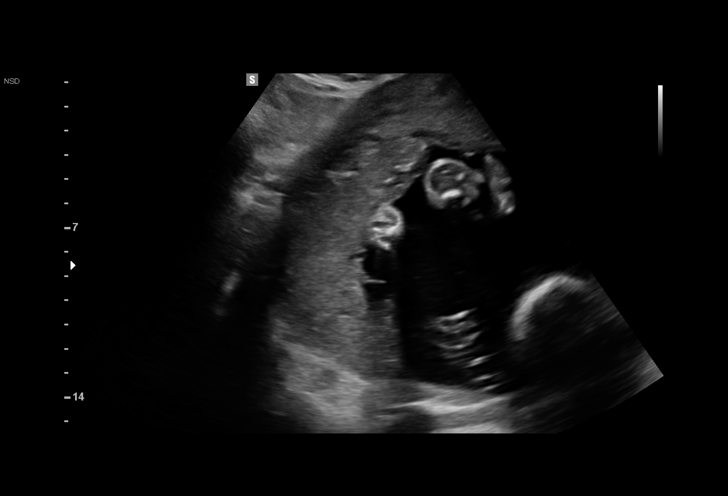
[im 6/33]
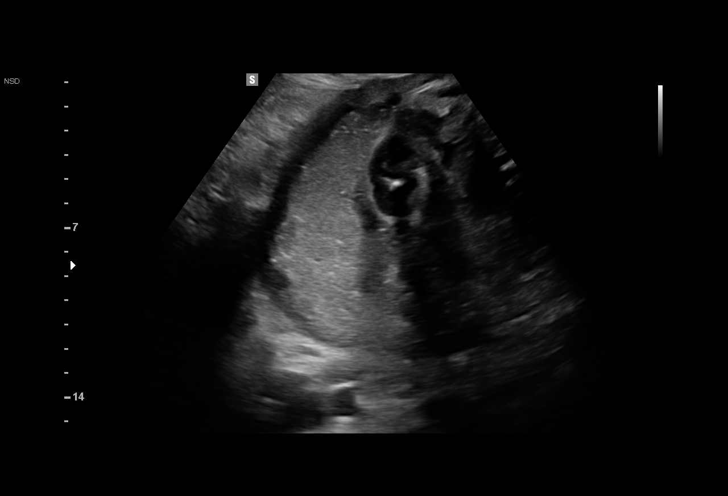
[im 9/33]
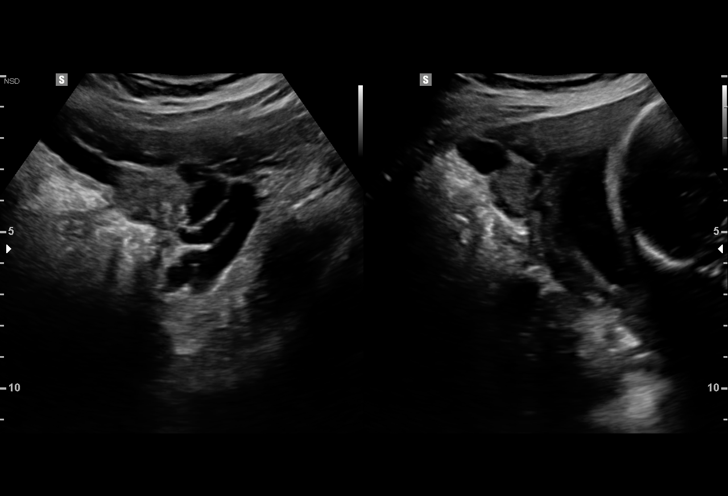
[im 11/33]
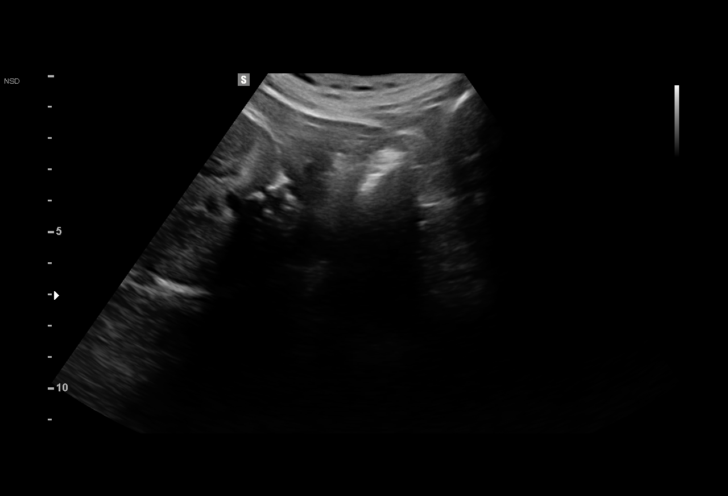
[im 14/33]
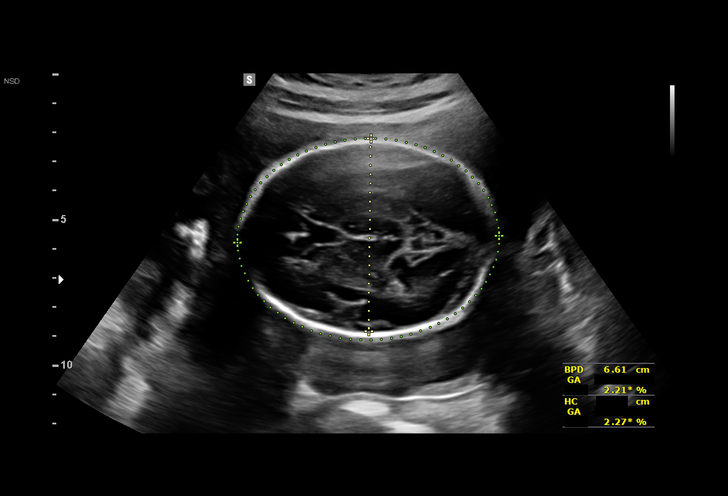
[im 16/33]
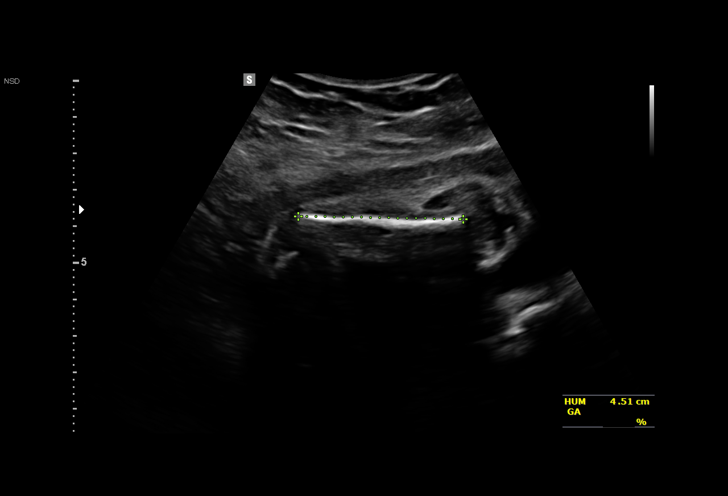
[im 18/33]
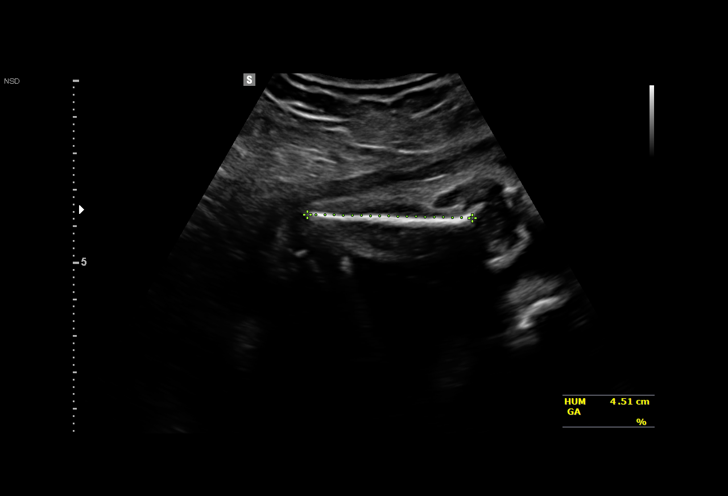
[im 21/33]
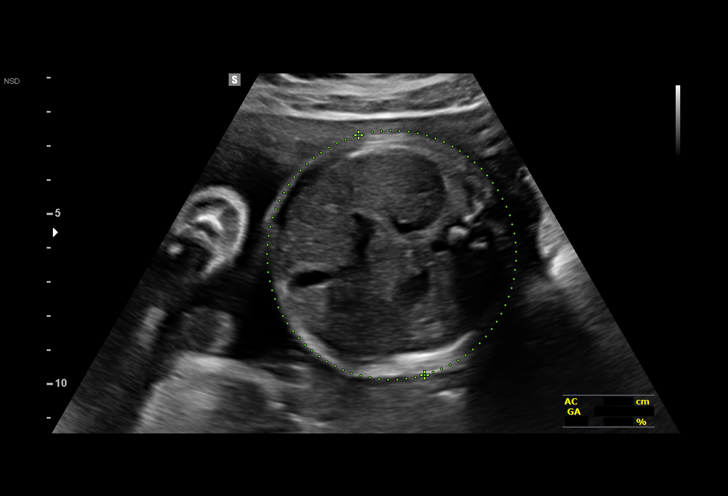
[im 23/33]
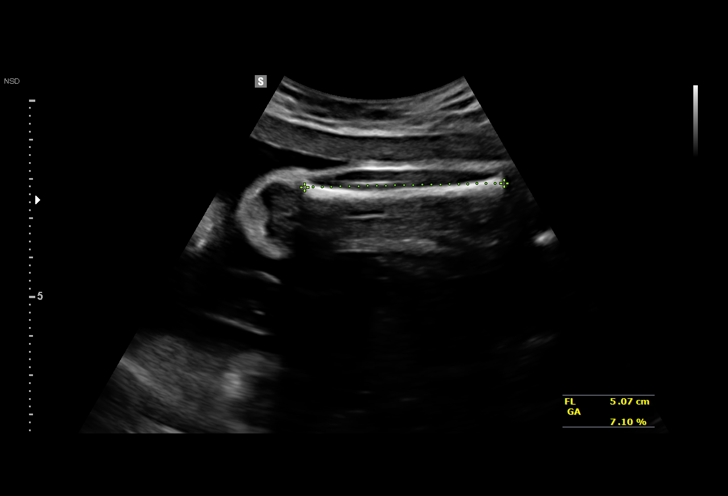
[im 25/33]
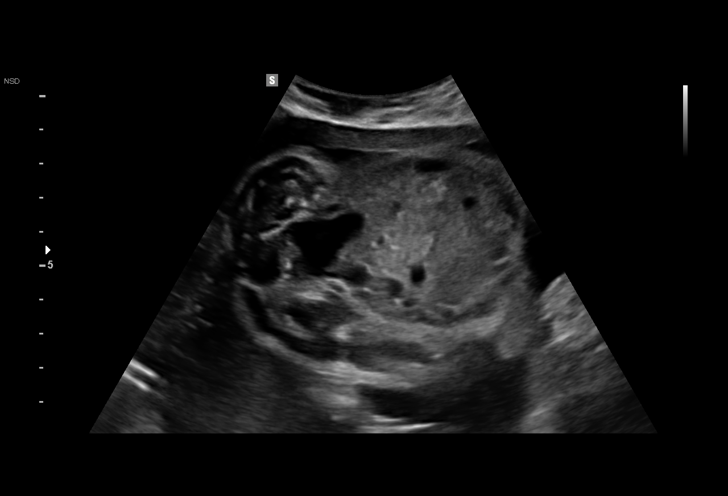
[im 28/33]
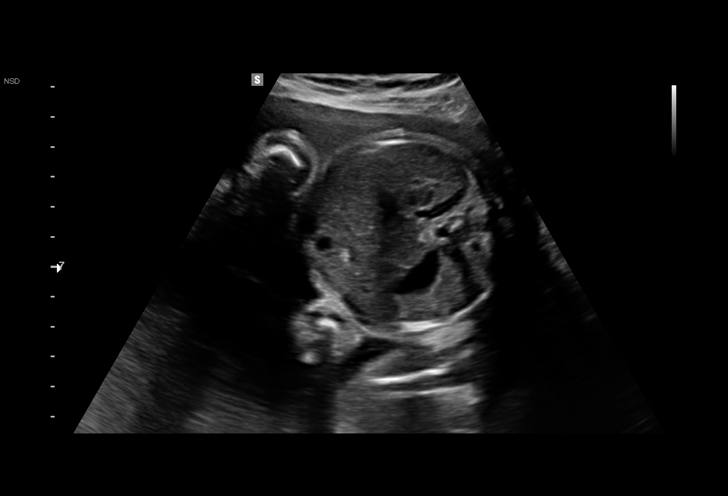
[im 30/33]
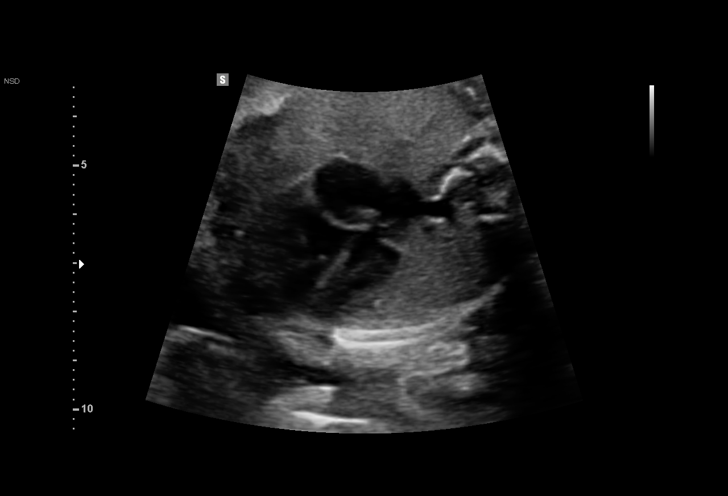
[im 33/33]
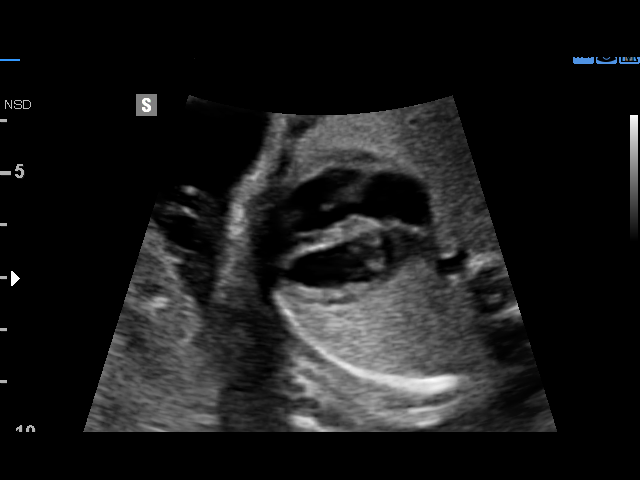

[14 of 28 positions shown; findings below may reference images not displayed]

[facility]yn Clinic
[REDACTED]-
Faculty Physician

1  CHUCHIN FRANCES            [id number]      [id number]     [id number]
Indications

Methadone use
Maternal sickle cell anemia, second trimester  O99.012,
Late prenatal care, second trimester
27 weeks gestation of pregnancy
Herpes simplex virus ([place]) 2
OB History

Gravidity:    3         Term:   1        Prem:   0        SAB:   1
TOP:          0       Ectopic:  0        Living: 1
Fetal Evaluation

Num Of Fetuses:     1
Cardiac Activity:   Observed
Presentation:       Cephalic
Placenta:           Posterior, above cervical os
P. Cord Insertion:  Previously Visualized

Amniotic Fluid
AFI FV:      Subjectively within normal limits
AFI Sum:     18.3    cm      71   %Tile     Larg Pckt:     6.8  cm
RUQ:   4.63   cm    RLQ:    3.5    cm    LUQ:   3.37    cm   LLQ:    6.8    cm
Biometry

BPD:      66.5  mm     G. Age:  26w 6d                  CI:        71.17   %   70 - 86
FL/HC:      20.3   %   18.6 -
HC:      251.1  mm     G. Age:  27w 2d         26  %    HC/AC:      1.10       1.05 -
AC:      229.2  mm     G. Age:  27w 2d         47  %    FL/BPD:     76.5   %   71 - 87
FL:       50.9  mm     G. Age:  27w 2d         39  %    FL/AC:      22.2   %   20 - 24
HUM:      44.9  mm     G. Age:  26w 4d         34  %

Est. FW:    5393  gm      2 lb 5 oz     55  %
Gestational Age

LMP:           27w 1d       Date:   02/09/15                 EDD:   11/16/15
U/S Today:     27w 1d                                        EDD:   11/16/15
Best:          27w 1d    Det. By:   LMP  (02/09/15)          EDD:   11/16/15
Anatomy

Cranium:          Appears normal         LVOT:             Previously seen
Fetal Cavum:      Previously seen        Aortic Arch:      Previously seen
Ventricles:       Appears normal         Ductal Arch:      Previously seen
Choroid Plexus:   Previously seen        Diaphragm:        Previously seen
Cerebellum:       Previously seen        Stomach:          Appears normal, left
sided
Posterior Fossa:  Previously seen        Abdomen:          Appears normal
Nuchal Fold:      Not applicable (>20    Abdominal Wall:   Previously seen
wks GA)
Face:             Orbits and profile     Cord Vessels:     Previously seen
previously seen
Lips:             Previously seen        Kidneys:          Appear normal
Palate:           Not well visualized    Bladder:          Appears normal
Fetal Thoracic:   Previously seen        Spine:            Previously seen
Heart:            Appears normal         Upper             Previously seen
(4CH, axis, and        Extremities:
situs)
RVOT:             Previously seen        Lower             Previously seen
Extremities:

Other:  Female gender previously seen. Heels previously seen.
Cervix Uterus Adnexa

Cervix
Length:            3.4  cm.
Normal appearance by transabdominal scan.

Left Ovary
Not visualized.

Right Ovary
Size(cm)       2.7 x    1.58   x  2.23      Vol(ml): 5
Within normal limits.

Adnexa:       No abnormality visualized.
Impression

SIUP at 83w5d, gestation complicated by sickle cell anemia
EFW 55th%'le
no dysmorphic features
no previa
Recommendations

1. interval growth monthly
2. antenatal testing at 32 weeks until delivery at 38-39 weeks
(risk for IUFD/adverse outcome with sickle cell anemia
requiring repeat exchange transfusions in gestation).

## 2016-07-02 MED ORDER — HEPARIN SOD (PORK) LOCK FLUSH 100 UNIT/ML IV SOLN
500.0000 [IU] | INTRAVENOUS | Status: AC | PRN
Start: 2016-07-02 — End: 2016-07-02
  Administered 2016-07-02: 500 [IU]
  Filled 2016-07-02: qty 5

## 2016-07-02 NOTE — Progress Notes (Signed)
Discharge instructions given,discussed with patient, verbalized understanding. All questions answered appropriately.

## 2016-07-02 NOTE — Progress Notes (Signed)
Five  golf ball size blood clots from vagina noted patient with  a total of 5 pads with minimal blood noted on pads.

## 2016-07-02 NOTE — Progress Notes (Signed)
Patient d/c home. Stable at d/c.

## 2016-07-02 NOTE — Progress Notes (Signed)
Texted Dr. Jonelle Sidle earlier on the results of hemoglobin level.

## 2016-07-02 NOTE — Progress Notes (Signed)
Patient on her menstrual cycle. Noted 2 small blood clots on pad and also has several blood clots in the collection hat.

## 2016-07-02 NOTE — Progress Notes (Signed)
This Probation officer called Dr. Connye Burkitt on the results of hemoglobin.

## 2016-07-02 NOTE — Discharge Summary (Signed)
Physician Discharge Summary  Patient ID: [name] MRN: [id number] DOB/AGE: 10-07-1990 26 y.o.  Admit date: 06/26/2016 Discharge date: 07/02/2016  Admission Diagnoses:  Discharge Diagnoses:  Principal Problem:   Sickle cell crisis ([place]) Active Problems:   Chronic pain   Chronic anticoagulation   Infiltrate of lung present on imaging of chest   Discharged Condition: good  Hospital Course: Patient is a 26 year old female admitted with sickle cell painful crisis in the setting of known chronic pain syndrome. She was presumed to have had acute chest syndrome was some infiltrate in her lungs initially antibiotics was started. This was Iater discontinued after patient was deemed not to have any infection. She had a pulse also has and then started having menstrual period in the hospital. She has heavy periods that required Aygestin 10 mgm QID then tapered as menstrual flow decreased. She finally felt much better prior to discharge. No transfusion was required. She was on Xarelto which was also maintained. At the time of discharge patient was back to baseline. She will continue her home medications and follow up with her PCP.  Consults: None  Significant Diagnostic Studies: labs: Serial CBCs and CMP is checked throughout  Treatments: IV hydration, antibiotics: azithromycin and analgesia: Dilaudid  Discharge Exam: Blood pressure 124/71, pulse 100, temperature 98.3 F (36.8 C), temperature source Oral, resp. rate 13, height 5\' 2"  (1.575 m), weight 91.2 kg (201 lb 1 oz), last menstrual period 06/23/2016, SpO2 97 %, not currently breastfeeding. General appearance: alert, cooperative and no distress Back: symmetric, no curvature. ROM normal. No CVA tenderness. Cardio: regular rate and rhythm, S1, S2 normal, no murmur, click, rub or gallop GI: soft, non-tender; bowel sounds normal; no masses,  no organomegaly Extremities: extremities normal, atraumatic, no cyanosis or edema Pulses: 2+ and  symmetric Skin: Skin color, texture, turgor normal. No rashes or lesions Neurologic: Grossly normal  Disposition: 01-Home or Self Care  Discharge Instructions    Diet - low sodium heart healthy    Complete by:  As directed    Discharge instructions    Complete by:  As directed    Increase activity slowly    Complete by:  As directed      Allergies as of 07/02/2016      Reactions   Food Hives, Other (See Comments)   Pt states that she is allergic to carrots.    Latex Rash      Medication List    TAKE these medications   aspirin-acetaminophen-caffeine 250-250-65 MG tablet Commonly known as:  EXCEDRIN MIGRAINE Take 2 tablets by mouth every 6 (six) hours as needed for headache.   hydroxyurea 500 MG capsule Commonly known as:  HYDREA Take 2 capsules (1,000 mg total) by mouth daily. May take with food to minimize GI side effects.   ibuprofen 600 MG tablet Commonly known as:  ADVIL,MOTRIN Take 1 tablet (600 mg total) by mouth every 6 (six) hours as needed for mild pain or moderate pain.   morphine 30 MG 12 hr tablet Commonly known as:  MS CONTIN Take 1 tablet (30 mg total) by mouth every 12 (twelve) hours.   oxyCODONE 15 MG immediate release tablet Commonly known as:  ROXICODONE Take 15 mg by mouth 4 (four) times daily.   rivaroxaban 20 MG Tabs tablet Commonly known as:  XARELTO Take 20 mg by mouth daily.   valACYclovir 1000 MG tablet Commonly known as:  VALTREX Take 1 tablet (1,000 mg total) by mouth daily. What changed:  when to  take this  reasons to take this        Signed: Tyler Robidoux,LAWAL 07/02/2016, 3:09 PM   Time spent 34 minutes

## 2016-07-13 ENCOUNTER — Observation Stay (HOSPITAL_COMMUNITY)
Admission: EM | Admit: 2016-07-13 | Discharge: 2016-07-13 | Disposition: A | Discharge status: Home / Self Care | Payer: Medicare Other | Attending: Internal Medicine | Admitting: Internal Medicine

## 2016-07-13 ENCOUNTER — Encounter (HOSPITAL_COMMUNITY): Payer: Self-pay | Admitting: Emergency Medicine

## 2016-07-13 ENCOUNTER — Emergency Department (HOSPITAL_COMMUNITY)
Admission: EM | Admit: 2016-07-13 | Discharge: 2016-07-13 | Disposition: A | Discharge status: Home / Self Care | Payer: Medicare Other | Source: Home / Self Care | Attending: Emergency Medicine | Admitting: Emergency Medicine

## 2016-07-13 DIAGNOSIS — N939 Abnormal uterine and vaginal bleeding, unspecified: Secondary | ICD-10-CM

## 2016-07-13 DIAGNOSIS — Z79891 Long term (current) use of opiate analgesic: Secondary | ICD-10-CM | POA: Insufficient documentation

## 2016-07-13 DIAGNOSIS — Z79899 Other long term (current) drug therapy: Secondary | ICD-10-CM | POA: Insufficient documentation

## 2016-07-13 DIAGNOSIS — Z87891 Personal history of nicotine dependence: Secondary | ICD-10-CM

## 2016-07-13 DIAGNOSIS — Z7901 Long term (current) use of anticoagulants: Secondary | ICD-10-CM | POA: Insufficient documentation

## 2016-07-13 DIAGNOSIS — Z7982 Long term (current) use of aspirin: Secondary | ICD-10-CM

## 2016-07-13 DIAGNOSIS — Z86718 Personal history of other venous thrombosis and embolism: Secondary | ICD-10-CM | POA: Insufficient documentation

## 2016-07-13 DIAGNOSIS — D5700 Hb-SS disease with crisis, unspecified: Secondary | ICD-10-CM | POA: Insufficient documentation

## 2016-07-13 DIAGNOSIS — R1084 Generalized abdominal pain: Secondary | ICD-10-CM | POA: Insufficient documentation

## 2016-07-13 DIAGNOSIS — Z7902 Long term (current) use of antithrombotics/antiplatelets: Secondary | ICD-10-CM | POA: Insufficient documentation

## 2016-07-13 DIAGNOSIS — Z91040 Latex allergy status: Secondary | ICD-10-CM | POA: Insufficient documentation

## 2016-07-13 DIAGNOSIS — D571 Sickle-cell disease without crisis: Principal | ICD-10-CM | POA: Insufficient documentation

## 2016-07-13 LAB — I-STAT CHEM 8, ED
BUN: 9 mg/dL (ref 6–20)
CHLORIDE: 107 mmol/L (ref 101–111)
CREATININE: 0.7 mg/dL (ref 0.44–1.00)
Calcium, Ion: 1.13 mmol/L — ABNORMAL LOW (ref 1.15–1.40)
GLUCOSE: 121 mg/dL — AB (ref 65–99)
HEMATOCRIT: 26 % — AB (ref 36.0–46.0)
HEMOGLOBIN: 8.8 g/dL — AB (ref 12.0–15.0)
POTASSIUM: 3.5 mmol/L (ref 3.5–5.1)
Sodium: 143 mmol/L (ref 135–145)
TCO2: 23 mmol/L (ref 0–100)

## 2016-07-13 LAB — COMPREHENSIVE METABOLIC PANEL
ALBUMIN: 4 g/dL (ref 3.5–5.0)
ALT: 17 U/L (ref 14–54)
AST: 20 U/L (ref 15–41)
Alkaline Phosphatase: 55 U/L (ref 38–126)
Anion gap: 5 (ref 5–15)
BUN: 12 mg/dL (ref 6–20)
CHLORIDE: 112 mmol/L — AB (ref 101–111)
CO2: 24 mmol/L (ref 22–32)
CREATININE: 0.57 mg/dL (ref 0.44–1.00)
Calcium: 8.8 mg/dL — ABNORMAL LOW (ref 8.9–10.3)
GFR calc non Af Amer: >60 mL/min (ref >60)
Glucose, Bld: 81 mg/dL (ref 65–99)
Potassium: 4.1 mmol/L (ref 3.5–5.1)
SODIUM: 141 mmol/L (ref 135–145)
Total Bilirubin: 1.7 mg/dL — ABNORMAL HIGH (ref 0.3–1.2)
Total Protein: 6.6 g/dL (ref 6.5–8.1)

## 2016-07-13 LAB — CBC WITH DIFFERENTIAL/PLATELET
BASOS PCT: 1 %
Basophils Absolute: 0.1 10*3/uL (ref 0.0–0.1)
EOS PCT: 8 %
Eosinophils Absolute: 0.8 10*3/uL — ABNORMAL HIGH (ref 0.0–0.7)
HEMATOCRIT: 23.5 % — AB (ref 36.0–46.0)
Hemoglobin: 8.2 g/dL — ABNORMAL LOW (ref 12.0–15.0)
Lymphocytes Relative: 34 %
Lymphs Abs: 3.6 10*3/uL (ref 0.7–4.0)
MCH: 30.4 pg (ref 26.0–34.0)
MCHC: 34.9 g/dL (ref 30.0–36.0)
MCV: 87 fL (ref 78.0–100.0)
MONO ABS: 1.1 10*3/uL — AB (ref 0.1–1.0)
Monocytes Relative: 10 %
NEUTROS PCT: 47 %
Neutro Abs: 5 10*3/uL (ref 1.7–7.7)
Platelets: 440 10*3/uL — ABNORMAL HIGH (ref 150–400)
RBC: 2.7 MIL/uL — ABNORMAL LOW (ref 3.87–5.11)
RDW: 16.2 % — AB (ref 11.5–15.5)
WBC: 10.6 10*3/uL — ABNORMAL HIGH (ref 4.0–10.5)

## 2016-07-13 LAB — URINALYSIS, ROUTINE W REFLEX MICROSCOPIC
BACTERIA UA: NONE SEEN
Bilirubin Urine: NEGATIVE
GLUCOSE, UA: NEGATIVE mg/dL
Ketones, ur: NEGATIVE mg/dL
Nitrite: NEGATIVE
PH: 5 (ref 5.0–8.0)
Protein, ur: NEGATIVE mg/dL
SPECIFIC GRAVITY, URINE: 1.011 (ref 1.005–1.030)

## 2016-07-13 LAB — GC/CHLAMYDIA PROBE AMP (~~LOC~~) NOT AT ARMC
Chlamydia: NEGATIVE
NEISSERIA GONORRHEA: NEGATIVE

## 2016-07-13 LAB — WET PREP, GENITAL
CLUE CELLS WET PREP: NONE SEEN
SPERM: NONE SEEN
Trich, Wet Prep: NONE SEEN
YEAST WET PREP: NONE SEEN

## 2016-07-13 LAB — RETICULOCYTES
RBC.: 2.7 MIL/uL — ABNORMAL LOW (ref 3.87–5.11)
RETIC COUNT ABSOLUTE: 237.6 10*3/uL — AB (ref 19.0–186.0)
Retic Ct Pct: 8.8 % — ABNORMAL HIGH (ref 0.4–3.1)

## 2016-07-13 MED ORDER — PROMETHAZINE HCL 25 MG PO TABS: 25.0000 mg | ORAL_TABLET | ORAL | Status: DC | PRN

## 2016-07-13 MED ORDER — HYDROMORPHONE HCL 2 MG/ML IJ SOLN
1.0000 mg | INTRAMUSCULAR | Status: AC | PRN
  Administered 2016-07-13 (×3): 1 mg via INTRAVENOUS
  Filled 2016-07-13 (×3): qty 1

## 2016-07-13 MED ORDER — ONDANSETRON HCL 4 MG/2ML IJ SOLN
4.0000 mg | Freq: Once | INTRAMUSCULAR | Status: AC
  Administered 2016-07-13: 4 mg via INTRAVENOUS
  Filled 2016-07-13: qty 2

## 2016-07-13 MED ORDER — DEXTROSE-NACL 5-0.45 % IV SOLN
INTRAVENOUS | Status: DC
  Administered 2016-07-13: 09:00:00 via INTRAVENOUS

## 2016-07-13 MED ORDER — HYDROMORPHONE HCL 2 MG/ML IJ SOLN: 2.0000 mg | INTRAMUSCULAR | Status: AC

## 2016-07-13 MED ORDER — DIPHENHYDRAMINE HCL 25 MG PO CAPS
50.0000 mg | ORAL_CAPSULE | Freq: Once | ORAL | Status: AC
  Administered 2016-07-13: 50 mg via ORAL
  Filled 2016-07-13: qty 2

## 2016-07-13 MED ORDER — HYDROMORPHONE HCL 2 MG/ML IJ SOLN
2.0000 mg | INTRAMUSCULAR | Status: AC
  Administered 2016-07-13: 2 mg via INTRAVENOUS

## 2016-07-13 MED ORDER — DEXTROSE-NACL 5-0.45 % IV SOLN: INTRAVENOUS | Status: DC

## 2016-07-13 MED ORDER — HYDROMORPHONE HCL 2 MG/ML IJ SOLN: 1.0000 mg | INTRAMUSCULAR | Status: DC | PRN

## 2016-07-13 MED ORDER — HYDROMORPHONE HCL 2 MG/ML IJ SOLN
2.0000 mg | INTRAMUSCULAR | Status: AC
  Administered 2016-07-13: 2 mg via INTRAVENOUS
  Filled 2016-07-13: qty 1

## 2016-07-13 MED ORDER — HEPARIN SOD (PORK) LOCK FLUSH 100 UNIT/ML IV SOLN
500.0000 [IU] | Freq: Once | INTRAVENOUS | Status: AC
  Administered 2016-07-13: 500 [IU]
  Filled 2016-07-13: qty 5

## 2016-07-13 MED ORDER — KETOROLAC TROMETHAMINE 30 MG/ML IJ SOLN
30.0000 mg | INTRAMUSCULAR | Status: AC
  Administered 2016-07-13: 30 mg via INTRAVENOUS
  Filled 2016-07-13: qty 1

## 2016-07-13 MED ORDER — KETOROLAC TROMETHAMINE 30 MG/ML IJ SOLN
30.0000 mg | Freq: Once | INTRAMUSCULAR | Status: AC
  Administered 2016-07-13: 30 mg via INTRAVENOUS
  Filled 2016-07-13: qty 1

## 2016-07-13 MED ORDER — HYDROMORPHONE HCL 2 MG/ML IJ SOLN
2.0000 mg | INTRAMUSCULAR | Status: AC
  Administered 2016-07-13: 2 mg via INTRAVENOUS
  Filled 2016-07-13 (×2): qty 1

## 2016-07-13 MED ORDER — DIPHENHYDRAMINE HCL 50 MG/ML IJ SOLN
25.0000 mg | Freq: Once | INTRAMUSCULAR | Status: AC
  Administered 2016-07-13: 25 mg via INTRAVENOUS
  Filled 2016-07-13: qty 1

## 2016-07-13 NOTE — ED Provider Notes (Signed)
[place] DEPT Provider Note   CSN: [id number] Arrival date & time: 07/13/16  [id number]     History   Chief Complaint Chief Complaint  Patient presents with  . Vaginal Bleeding  . Sickle Cell Pain Crisis    HPI [name] is a 26 y.o. female.  HPI  26 y.o. female with a hx of Sickle Cell Anemia, presents to the Emergency Department today complaining of vaginal bleeding with onset 8 days ago. Notes generalized abdominal pain. Seen at [facility] ED for same earlier this morning. Pt was going to be admitted for pain control, but pt declined admission and chose to go home instead. Reports here due to unhappiness with care at [facility]. Pt has not tried any medications for relief. Reports no change in symptoms from [facility] ED to here. Pt notes going from 1.5-2 boxes of feminine pads in 2 days with vaginal bleeding. States he is in her general sickle cell pain crisis due to prolonged vaginal bleeding. Pt with depo shot at beginning of January. No vaginal discharge. No hematuria. No dysuria. No recent sexual intercourse. Pt is on Xarelto due to previous clot in arm. No new symptoms noted.    Past Medical History:  Diagnosis Date  . Blood transfusion    "lots"  . Blood transfusion without reported diagnosis   . Chronic back pain    "very severe; have knot in my back; from tight muscle; take RX and exercise for it"  . Depression 01/06/2011  . Exertional dyspnea    "sometimes"  . Genital HSV   . GERD (gastroesophageal reflux disease) 02/17/2011  . Migraines 11/08/11   "@ least twice/month"  . 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  . Mood swings ([place]) 11/08/11   "I go back and forth; real bad"  . Sickle cell anemia ([name])   . Sickle cell anemia with crisis ([place])   . Trichotillomania    h/o    Patient Active Problem List   Diagnosis Date Noted  . Sickle cell crisis ([place]) 06/26/2016  . Infiltrate of lung present on imaging of chest 06/26/2016  . Chronic anticoagulation   . Sickle  cell anemia ([place]) 05/19/2016  . History of DVT (deep vein thrombosis) 04/17/2016  . Post partum depression 04/03/2016  . 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 Sickle cell anemia with pain ([place]) 02/16/2016  . Thrombocytosis ([place]) 11/09/2015  . Hyperbilirubinemia 11/09/2015  . Narcotic dependence ([place]) 10/06/2015  . 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 High risk for intrapartum complications, antepartum 08/12/2015  . Chronic pain 08/04/2015  . Cocaine use 07/24/2015  . Herpes simplex 07/14/2015  . Anemia of chronic disease   . 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 Sickle cell disease ([place]) 01/08/2009  . 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 CHOLECYSTECTOMY  05/2010  . DILATION AND CURETTAGE OF UTERUS  02/20/11   S/P miscarriage  . IR GENERIC HISTORICAL  12/23/2015   IR FLUORO GUIDE CV LINE RIGHT 12/23/2015 Jacqulynn Cadet, MD [facility]-INTERV RAD  . IR GENERIC HISTORICAL  12/23/2015   IR US GUIDE VASC ACCESS RIGHT 12/23/2015 Jacqulynn Cadet, MD [facility]-INTERV RAD    OB History    Gravida Para Term Preterm AB Living   3 2 2   1 2    SAB TAB Ectopic Multiple Live Births   1     0 2  Obstetric Comments   Miscarried in October 2012 at about 7 weeks       Home Medications    Prior to Admission medications   Medication Sig Start Date End Date Taking? Authorizing Provider  aspirin-acetaminophen-caffeine (EXCEDRIN MIGRAINE) [id number] MG tablet Take 2 tablets by mouth every 6 (six) hours as needed for headache.     Historical Provider, MD  hydroxyurea (HYDREA) 500 MG capsule Take 2 capsules (1,000 mg total) by mouth daily. May take with food to minimize GI side effects. 04/18/16   Tresa Garter, MD  ibuprofen (ADVIL,MOTRIN) 600 MG tablet Take 1 tablet (600 mg total) by mouth every 6 (six) hours as needed for mild pain or moderate pain. 03/14/16   Tresa Garter, MD  morphine (MS CONTIN) 30 MG 12 hr tablet Take 1 tablet (30 mg total) by mouth every 12 (twelve) hours. Patient not taking: Reported on 07/13/2016 05/31/16   Tresa Garter, MD  morphine (MS CONTIN) 60 MG 12 hr tablet Take 1 tablet by mouth 2 (two) times daily. 07/05/16   Historical Provider, MD  oxyCODONE (ROXICODONE) 15 MG immediate release tablet Take 15 mg by mouth 4 (four) times daily. 06/18/16   Historical Provider, MD  rivaroxaban (XARELTO) 20 MG TABS tablet Take 20 mg by mouth daily.     Historical Provider, MD  valACYclovir (VALTREX) 1000 MG tablet Take 1 tablet (1,000 mg total) by mouth daily. Patient taking differently: Take 1,000 mg by mouth daily as needed (cold sores).  05/02/16   [name], MD    Family History Family History  Problem Relation Age of Onset  . Sickle cell trait Mother   . Sickle cell trait Father   . Diabetes Maternal Grandmother   . Diabetes Paternal Grandmother   . Hypertension Paternal Grandmother   . Diabetes Maternal Grandfather     Social History Social History  Substance Use Topics  . Smoking status: Former Smoker    Packs/day: 0.25    Years: 1.00    Types: Cigarettes    Quit date: 03/25/2013  . Smokeless tobacco: Never Used  . Alcohol use No     Comment: pt states she quit marijuan in May 2013. Rare ETOH, + cigarettes.  She is enrolled in school   Allergies   Food and Latex   Review of Systems Review of Systems ROS reviewed and all are negative for acute change except as noted in the HPI.  Physical Exam Updated Vital Signs BP 102/68   Pulse 87   Temp 99.4 F (37.4 C)   Resp 12   LMP 06/23/2016   SpO2 98%   Physical Exam  Constitutional: She is oriented to person, place, and time. Vital signs are normal. She appears well-developed and well-nourished.  HENT:  Head: Normocephalic and atraumatic.  Right Ear: Hearing normal.  Left Ear: Hearing normal.  Eyes: Conjunctivae and EOM are normal. Pupils are  equal, round, and reactive to light.  Neck: Normal range of motion. Neck supple.  Cardiovascular: Normal rate, regular rhythm, normal heart sounds and intact distal pulses.   Pulmonary/Chest: Effort normal and breath sounds normal.  Abdominal: Soft. There is no tenderness.  Musculoskeletal: Normal range of motion.  Neurological: She is alert and oriented to person, place, and time.  Skin: Skin is warm and dry.  Psychiatric: She has a normal mood and affect. Her speech is normal and behavior is normal. Thought content normal.  Nursing note and vitals reviewed.  ED Treatments /  Results  Labs (all labs ordered are listed, but only abnormal results are displayed) Labs Reviewed  URINALYSIS, ROUTINE W REFLEX MICROSCOPIC - Abnormal; Notable for the following:       Result Value   Hgb urine dipstick LARGE (*)    Leukocytes, UA TRACE (*)    Squamous Epithelial / LPF 0-5 (*)    All other components within normal limits  I-STAT CHEM 8, ED - Abnormal; Notable for the following:    Glucose, Bld 121 (*)    Calcium, Ion 1.13 (*)    Hemoglobin 8.8 (*)    HCT 26.0 (*)    All other components within normal limits    EKG  EKG Interpretation None      Radiology No results found.  Procedures Procedures (including critical care time)  Medications Ordered in ED Medications  dextrose 5 %-0.45 % sodium chloride infusion ( Intravenous New Bag/Given 07/13/16 0832)  HYDROmorphone (DILAUDID) injection 2 mg (2 mg Intravenous Given 07/13/16 0914)    Or  HYDROmorphone (DILAUDID) injection 2 mg ( Subcutaneous See Alternative 07/13/16 0914)  heparin lock flush 100 unit/mL (not administered)  ketorolac (TORADOL) 30 MG/ML injection 30 mg (30 mg Intravenous Given 07/13/16 0831)  diphenhydrAMINE (BENADRYL) injection 25 mg (25 mg Intravenous Given 07/13/16 0831)  HYDROmorphone (DILAUDID) injection 2 mg (2 mg Intravenous Given 07/13/16 0831)    Or  HYDROmorphone (DILAUDID) injection 2 mg ( Subcutaneous See  Alternative 07/13/16 0831)  HYDROmorphone (DILAUDID) injection 2 mg (2 mg Intravenous Given 07/13/16 1000)    Or  HYDROmorphone (DILAUDID) injection 2 mg ( Subcutaneous See Alternative 07/13/16 1000)  ondansetron (ZOFRAN) injection 4 mg (4 mg Intravenous Given 07/13/16 0831)     Initial Impression / Assessment and Plan / ED Course  I have reviewed the triage vital signs and the nursing notes.  Pertinent labs & imaging results that were available during my care of the patient were reviewed by me and considered in my medical decision making (see chart for details).  Final Clinical Impressions(s) / ED Diagnoses  {I have reviewed and evaluated the relevant laboratory values.   {I have reviewed the relevant previous healthcare records.  {I obtained HPI from historian. {Patient discussed with supervising physician.  ED Course:  Assessment: Pt is a 26 y.o. female with hx Sickle Cell Anemia who presents with sickle cell pain which is typical for patient. Notes vaginal bleeding as well that she feels is exacerbating her pain. Seen at [facility] earlier this morning for same. Left due to unhappiness with treatment. Declined admission due to child care issues at home. On exam, pt in NAD. Nontoxic/nonseptic appearing. VSS. Afebrile. Lungs CTA. Heart RRR. Abdomen nontender soft. iStat chem8 with Hgb 8.8. UA unremarkable. Given sickle cell pain medications per protocol x 3 in ED. Pt requested DC after third dose. Discussed vaginal bleeding with patient. Recently on Depo Shot in January. No change in bleeding since [facility]. Unremarkable exam per previous provider. Plan is to DC home with follow up to [facility]. Pt agrees with plan.  At time of discharge, Patient is in no acute distress. Vital Signs are stable. Patient is able to ambulate. Patient able to tolerate PO.   Disposition/Plan:  DC Home Additional Verbal discharge instructions given and discussed with patient.  Pt Instructed to f/u with PCP in the next week for  evaluation and treatment of symptoms. Return precautions given Pt acknowledges and agrees with plan  Supervising Physician Fatima Blank, MD  Final diagnoses:  Sickle cell pain crisis [facility])  Vaginal bleeding    New Prescriptions New Prescriptions   No medications on file     Shary Decamp, PA-C 07/13/16 [place], MD 07/13/16 1736

## 2016-07-13 NOTE — Discharge Instructions (Signed)
Please read and follow all provided instructions.  Your diagnoses today include:  1. Sickle cell pain crisis ([place])   2. Vaginal bleeding    Tests performed today include: Vital signs. See below for your results today.   Medications prescribed:  Take as prescribed   Home care instructions:  Follow any educational materials contained in this packet.  Follow-up instructions: Please follow-up with your primary care provider for further evaluation of symptoms and treatment   Return instructions:  Please return to the Emergency Department if you do not get better, if you get worse, or new symptoms OR  - Fever (temperature greater than 101.46F)  - Bleeding that does not stop with holding pressure to the area    -Severe pain (please note that you may be more sore the day after your accident)  - Chest Pain  - Difficulty breathing  - Severe nausea or vomiting  - Inability to tolerate food and liquids  - Passing out  - Skin becoming red around your wounds  - Change in mental status (confusion or lethargy)  - New numbness or weakness    Please return if you have any other emergent concerns.  Additional Information:  Your vital signs today were: BP 103/58    Pulse 87    Temp 99.4 F (37.4 C)    Resp 14    LMP 06/23/2016    SpO2 97%  If your blood pressure (BP) was elevated above 135/85 this visit, please have this repeated by your doctor within one month. ---------------

## 2016-07-13 NOTE — ED Notes (Signed)
Pt tolerating oral fluids 

## 2016-07-13 NOTE — ED Triage Notes (Signed)
Pt states that she has had back pain x 2 days and vaginal bleeding x 1 month. States she has run out of her provera that usually stops the bleeding. Feels like she is having sickle cell crisis. Alert and oriented.

## 2016-07-13 NOTE — ED Notes (Addendum)
Pt told by RN that MD Adeleke would only give 1 mg dilaudid and that he would not give IV benadryl.  Pt told RN that she didn't want him as MD anymore and would 'stay until 7 when the new doctors came in'.  MD and charge RN Terri alerted.  MD verbally ordered oral benadryl but told pt that he would not give 1 mg dilaudid and that her options were discharge or admittance.  Pt refused to leave saying she would wait for another MD and would not be admitted, would not let RN near her to deaccess port or to give last dilaudid dose.  Pt yelled at MD saying that he needed to get out of her face and that he didn't know how to properly treat [place] patients.  Stated she was going to call the [facility] clinic and report MD as well as staff for poor treatment.  Told the RN and Charge Terri that she wasn't going to leave until she was properly treated.

## 2016-07-13 NOTE — ED Notes (Signed)
Spoke at length with pt who states she doesn't want to be admitted because of her children but also feels that she requires more pain medication for her [facility] management.  Pt still angry but calm and said that she would take the benadryl and last dilaudid 1mg  dose that was scheduled for earlier.

## 2016-07-13 NOTE — ED Notes (Signed)
Pt asking for 2mg  dose again as well as IV benadryl and toradol.  md aware

## 2016-07-13 NOTE — H&P (Deleted)
Received from Dr. Claudine Mouton. Ms. Pendergast is a 26 year old female with sickle cell disease; who presents with low back pain similar to previous sickle cell crisis. Patient reports per protocol by vaginal bleeding. ED physician perform vaginal exam and noted minimal bleeding.  Vital signs stable. Lab work reveals WBC 10.6, hemoglobin 8.2(baseline 6-7), platelets 440. Follow-up GC chlamydia probe. Given 3 mg of Dilaudid IV, Toradol, and Benadryl.TRH called to admit.

## 2016-07-13 NOTE — ED Provider Notes (Addendum)
[facility] DEPT Provider Note   CSN: [id number] Arrival date & time: 07/13/16  0116   By signing my name below, I, [name], attest that this documentation has been prepared under the direction and in the presence of Everlene Balls, MD. Electronically Signed: Soijett [name], ED Scribe. 07/13/16.  3:51 AM.  History   Chief Complaint Chief Complaint  Patient presents with  . Vaginal Bleeding  . Sickle Cell Pain Crisis    HPI [name] is a 26 y.o. female with a PMHx of sickle cell anemia, who presents to the Emergency Department complaining of vaginal bleeding onset 8 days ago. Pt reports associated generalized abdominal pain. Pt has not tried any medications for the relief of her symptoms. She notes that she recently ran out of her prescription provera. Pt states she went through 1.5 boxes of feminine pads in 2 days due to her increased vagina bleeding. She reports that she is in a sickle cell crisis due to her prolonged vaginal bleeding. She denies vaginal discharge, hematuria, and any other symptoms. Denies recent sexual intercourse. Pt states that she is on xarelto.    The history is provided by the patient. No language interpreter was used.    Past Medical History:  Diagnosis Date  . Blood transfusion    "lots"  . Blood transfusion without reported diagnosis   . Chronic back pain    "very severe; have knot in my back; from tight muscle; take RX and exercise for it"  . Depression 01/06/2011  . Exertional dyspnea    "sometimes"  . Genital HSV   . GERD (gastroesophageal reflux disease) 02/17/2011  . Migraines 11/08/11   "@ least twice/month"  . 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  . Mood swings ([place]) 11/08/11   "I go back and forth; real bad"  . Sickle cell anemia ([name])   . Sickle cell anemia with crisis ([place])   . Trichotillomania    h/o    Patient Active Problem List   Diagnosis Date Noted  . Sickle cell crisis ([place]) 06/26/2016  . Infiltrate  of lung present on imaging of chest 06/26/2016  . Chronic anticoagulation   . Sickle cell anemia ([place]) 05/19/2016  . History of DVT (deep vein thrombosis) 04/17/2016  . Post partum depression 04/03/2016  . 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 Sickle cell anemia with pain ([place]) 02/16/2016  . Thrombocytosis ([place]) 11/09/2015  . Hyperbilirubinemia 11/09/2015  . Narcotic dependence ([place]) 10/06/2015  . 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 High risk for intrapartum complications, antepartum 08/12/2015  . Chronic pain 08/04/2015  . Cocaine use 07/24/2015  . Herpes simplex 07/14/2015  . Anemia of chronic disease   . 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 Sickle cell disease ([place]) 01/08/2009  . 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 CHOLECYSTECTOMY  05/2010  . DILATION AND CURETTAGE OF UTERUS  02/20/11   S/P miscarriage  . IR GENERIC HISTORICAL  12/23/2015   IR FLUORO GUIDE CV LINE RIGHT 12/23/2015 Jacqulynn Cadet, MD [facility]  . IR GENERIC HISTORICAL  12/23/2015   IR US GUIDE VASC ACCESS RIGHT 12/23/2015 Jacqulynn Cadet, MD [facility]    OB History    Gravida Para Term Preterm AB Living   3 2 2   1 2    SAB TAB Ectopic Multiple Live Births   1  0 2      Obstetric Comments   Miscarried in October 2012 at about 7 weeks       Home Medications    Prior to Admission medications   Medication Sig Start Date End Date Taking? Authorizing Provider  aspirin-acetaminophen-caffeine (EXCEDRIN MIGRAINE) [phone] MG tablet Take 2 tablets by mouth every 6 (six) hours as needed for headache.     Historical Provider, MD  hydroxyurea (HYDREA) 500 MG capsule Take 2 capsules (1,000 mg total) by mouth daily. May take with food to minimize GI side effects. 04/18/16   Tresa Garter, MD  ibuprofen (ADVIL,MOTRIN) 600 MG tablet Take 1 tablet (600 mg total) by mouth  every 6 (six) hours as needed for mild pain or moderate pain. 03/14/16   Tresa Garter, MD  morphine (MS CONTIN) 30 MG 12 hr tablet Take 1 tablet (30 mg total) by mouth every 12 (twelve) hours. 05/31/16   Tresa Garter, MD  oxyCODONE (ROXICODONE) 15 MG immediate release tablet Take 15 mg by mouth 4 (four) times daily. 06/18/16   Historical Provider, MD  rivaroxaban (XARELTO) 20 MG TABS tablet Take 20 mg by mouth daily.     Historical Provider, MD  valACYclovir (VALTREX) 1000 MG tablet Take 1 tablet (1,000 mg total) by mouth daily. Patient taking differently: Take 1,000 mg by mouth daily as needed (cold sores).  05/02/16   [name], MD    Family History Family History  Problem Relation Age of Onset  . Sickle cell trait Mother   . Sickle cell trait Father   . Diabetes Maternal Grandmother   . Diabetes Paternal Grandmother   . Hypertension Paternal Grandmother   . Diabetes Maternal Grandfather     Social History Social History  Substance Use Topics  . Smoking status: Former Smoker    Packs/day: 0.25    Years: 1.00    Types: Cigarettes    Quit date: 03/25/2013  . Smokeless tobacco: Never Used  . Alcohol use No     Comment: pt states she quit marijuan in May 2013. Rare ETOH, + cigarettes.  She is enrolled in school     Allergies   Food and Latex   Review of Systems Review of Systems  A complete 10 system review of systems was obtained and all systems are negative except as noted in the HPI and PMH.   Physical Exam Updated Vital Signs BP 105/69 (BP Location: Right Arm)   Pulse 83   Temp 98.7 F (37.1 C) (Oral)   Resp 14   Wt 182 lb (82.6 kg)   LMP 06/23/2016   SpO2 98%   BMI 33.29 kg/m   Physical Exam  Constitutional: She is oriented to person, place, and time. She appears well-developed and well-nourished. No distress.  HENT:  Head: Normocephalic and atraumatic.  Nose: Nose normal.  Mouth/Throat: Oropharynx is clear and moist. No oropharyngeal  exudate.  Eyes: Conjunctivae and EOM are normal. Pupils are equal, round, and reactive to light. No scleral icterus.  Neck: Normal range of motion. Neck supple. No JVD present. No tracheal deviation present. No thyromegaly present.  Cardiovascular: Normal rate, regular rhythm and normal heart sounds.  Exam reveals no gallop and no friction rub.   No murmur heard. Pulmonary/Chest: Effort normal and breath sounds normal. No respiratory distress. She has no wheezes. She exhibits no tenderness.  Abdominal: Soft. Bowel sounds are normal. She exhibits no distension and no mass. There is no tenderness. There is no rebound  and no guarding.  Genitourinary:  Genitourinary Comments: Chaperone present for exam. Minimal amounts of vaginal bleeding seen. No CMT or adnexal tenderness  Musculoskeletal: Normal range of motion. She exhibits no edema or tenderness.  Lymphadenopathy:    She has no cervical adenopathy.  Neurological: She is alert and oriented to person, place, and time. No cranial nerve deficit. She exhibits normal muscle tone.  Skin: Skin is warm and dry. No rash noted. No erythema. No pallor.  Nursing note and vitals reviewed.    ED Treatments / Results  DIAGNOSTIC STUDIES: Oxygen Saturation is 98% on RA, nl by my interpretation.    COORDINATION OF CARE: 3:35 AM Discussed treatment plan with pt at bedside which includes labs, pelvic exam, wet prep, and pt agreed to plan.   Labs (all labs ordered are listed, but only abnormal results are displayed) Labs Reviewed  COMPREHENSIVE METABOLIC PANEL  CBC WITH DIFFERENTIAL/PLATELET  RETICULOCYTES    EKG  EKG Interpretation None       Radiology No results found.  Procedures Procedures (including critical care time)  Medications Ordered in ED Medications - No data to display   Initial Impression / Assessment and Plan / ED Course  I have reviewed the triage vital signs and the nursing notes.  Pertinent labs & imaging results  that were available during my care of the patient were reviewed by me and considered in my medical decision making (see chart for details).     Patient presents to the ED for vaginal bleeding and sickle cell pain. She thinks the bleeding caused the pain.  Pelvic exam does not reveal any significant bleed, only trace blood present. Nothing active.  She was given dilaudid x 3 doses for her sickle cell pain.  If continued pain, will admit for further control.  5:32 AM Patient continues to have pain. Will admit to hospitalist for further care.  Final Clinical Impressions(s) / ED Diagnoses   Final diagnoses:  None    New Prescriptions New Prescriptions   No medications on file     I personally performed the services described in this documentation, which was scribed in my presence. The recorded information has been reviewed and is accurate.      Everlene Balls, MD 07/13/16 0608   6:33 AM PAtient states she now feels better and wants to go home. I spoke with dr. Tamala Julian to cancel the admission. Patient DC;ed in good condition.   Everlene Balls, MD 07/13/16 [id number]

## 2016-07-13 NOTE — ED Notes (Signed)
Pt states that she wants to be discharged.  Charge RN, MD, and hospitalist alerted.  Port will be deaccessed.  Pt understands discharge and that she is refusing admission.  States she will go to [facility] instead.

## 2016-07-13 NOTE — ED Notes (Signed)
Pt states she was just seen at [facility] for vaginal bleeding, was unhappy with care there, pt here for same reason.

## 2016-07-13 NOTE — ED Notes (Signed)
Pt reports that the 1 mg dose isn't enough to control her pain and that she usually gets 2 mg.  RN alerted MD.

## 2016-07-13 NOTE — Progress Notes (Signed)
Received from Dr. Claudine Mouton. [name] is a 26 year old female with sickle cell disease; who presents with low back pain similar to previous sickle cell crisis. Patient reports per protocol by vaginal bleeding. ED physician perform vaginal exam and noted minimal bleeding.  Vital signs stable. Lab work reveals WBC 10.6, hemoglobin 8.2(baseline 6-7), platelets 440. Follow-up GC chlamydia probe. Given 3 mg of Dilaudid IV, Toradol, and Benadryl.TRH called to admit.

## 2016-07-14 DIAGNOSIS — Z79891 Long term (current) use of opiate analgesic: Secondary | ICD-10-CM | POA: Diagnosis not present

## 2016-07-24 ENCOUNTER — Encounter (HOSPITAL_COMMUNITY): Payer: Self-pay | Admitting: Oncology

## 2016-07-24 DIAGNOSIS — Z7901 Long term (current) use of anticoagulants: Secondary | ICD-10-CM

## 2016-07-24 DIAGNOSIS — Z7982 Long term (current) use of aspirin: Secondary | ICD-10-CM

## 2016-07-24 DIAGNOSIS — D5700 Hb-SS disease with crisis, unspecified: Principal | ICD-10-CM | POA: Diagnosis present

## 2016-07-24 DIAGNOSIS — Z91040 Latex allergy status: Secondary | ICD-10-CM

## 2016-07-24 DIAGNOSIS — Z91018 Allergy to other foods: Secondary | ICD-10-CM

## 2016-07-24 DIAGNOSIS — M79604 Pain in right leg: Secondary | ICD-10-CM | POA: Diagnosis present

## 2016-07-24 DIAGNOSIS — Z79899 Other long term (current) drug therapy: Secondary | ICD-10-CM | POA: Diagnosis not present

## 2016-07-24 DIAGNOSIS — R000 Tachycardia, unspecified: Secondary | ICD-10-CM | POA: Diagnosis not present

## 2016-07-24 DIAGNOSIS — Z87891 Personal history of nicotine dependence: Secondary | ICD-10-CM

## 2016-07-24 DIAGNOSIS — F329 Major depressive disorder, single episode, unspecified: Secondary | ICD-10-CM | POA: Diagnosis present

## 2016-07-24 DIAGNOSIS — K219 Gastro-esophageal reflux disease without esophagitis: Secondary | ICD-10-CM | POA: Diagnosis not present

## 2016-07-24 DIAGNOSIS — D72829 Elevated white blood cell count, unspecified: Secondary | ICD-10-CM | POA: Diagnosis not present

## 2016-07-24 DIAGNOSIS — Z86718 Personal history of other venous thrombosis and embolism: Secondary | ICD-10-CM

## 2016-07-24 DIAGNOSIS — D638 Anemia in other chronic diseases classified elsewhere: Secondary | ICD-10-CM | POA: Diagnosis not present

## 2016-07-24 DIAGNOSIS — Z79891 Long term (current) use of opiate analgesic: Secondary | ICD-10-CM

## 2016-07-24 DIAGNOSIS — D571 Sickle-cell disease without crisis: Secondary | ICD-10-CM | POA: Diagnosis not present

## 2016-07-24 DIAGNOSIS — G894 Chronic pain syndrome: Secondary | ICD-10-CM | POA: Diagnosis not present

## 2016-07-24 NOTE — ED Triage Notes (Signed)
Pt c/o typical sickle cell pain to right leg.  Pt rates pain 8/10.  Pt has taken home medication w/o relief.

## 2016-07-25 ENCOUNTER — Emergency Department (HOSPITAL_COMMUNITY)
Admission: EM | Admit: 2016-07-25 | Discharge: 2016-07-25 | Disposition: A | Discharge status: Home / Self Care | Payer: Medicare Other | Source: Home / Self Care | Attending: Emergency Medicine | Admitting: Emergency Medicine

## 2016-07-25 ENCOUNTER — Inpatient Hospital Stay (HOSPITAL_COMMUNITY)
Admission: EM | Admit: 2016-07-25 | Discharge: 2016-07-29 | DRG: 812 | Disposition: A | Discharge status: Home / Self Care | Payer: Medicare Other | Attending: Internal Medicine | Admitting: Internal Medicine

## 2016-07-25 ENCOUNTER — Encounter (HOSPITAL_COMMUNITY): Payer: Self-pay | Admitting: Family Medicine

## 2016-07-25 ENCOUNTER — Encounter (HOSPITAL_COMMUNITY): Payer: Self-pay | Admitting: Emergency Medicine

## 2016-07-25 ENCOUNTER — Observation Stay (HOSPITAL_COMMUNITY): Payer: Medicare Other

## 2016-07-25 DIAGNOSIS — Z86718 Personal history of other venous thrombosis and embolism: Secondary | ICD-10-CM

## 2016-07-25 DIAGNOSIS — M79604 Pain in right leg: Secondary | ICD-10-CM | POA: Diagnosis not present

## 2016-07-25 DIAGNOSIS — Z7901 Long term (current) use of anticoagulants: Secondary | ICD-10-CM | POA: Insufficient documentation

## 2016-07-25 DIAGNOSIS — Z87891 Personal history of nicotine dependence: Secondary | ICD-10-CM | POA: Insufficient documentation

## 2016-07-25 DIAGNOSIS — G894 Chronic pain syndrome: Secondary | ICD-10-CM

## 2016-07-25 DIAGNOSIS — D5700 Hb-SS disease with crisis, unspecified: Secondary | ICD-10-CM

## 2016-07-25 DIAGNOSIS — F1990 Other psychoactive substance use, unspecified, uncomplicated: Secondary | ICD-10-CM

## 2016-07-25 DIAGNOSIS — M25561 Pain in right knee: Secondary | ICD-10-CM | POA: Diagnosis not present

## 2016-07-25 DIAGNOSIS — D72829 Elevated white blood cell count, unspecified: Secondary | ICD-10-CM | POA: Diagnosis present

## 2016-07-25 DIAGNOSIS — Z91040 Latex allergy status: Secondary | ICD-10-CM

## 2016-07-25 DIAGNOSIS — D571 Sickle-cell disease without crisis: Secondary | ICD-10-CM | POA: Diagnosis not present

## 2016-07-25 DIAGNOSIS — Z7982 Long term (current) use of aspirin: Secondary | ICD-10-CM

## 2016-07-25 LAB — COMPREHENSIVE METABOLIC PANEL
ALBUMIN: 4.2 g/dL (ref 3.5–5.0)
ALT: 15 U/L (ref 14–54)
ALT: 16 U/L (ref 14–54)
ANION GAP: 5 (ref 5–15)
ANION GAP: 6 (ref 5–15)
AST: 20 U/L (ref 15–41)
AST: 20 U/L (ref 15–41)
Albumin: 4.4 g/dL (ref 3.5–5.0)
Alkaline Phosphatase: 56 U/L (ref 38–126)
Alkaline Phosphatase: 55 U/L (ref 38–126)
BUN: 13 mg/dL (ref 6–20)
BUN: 12 mg/dL (ref 6–20)
CALCIUM: 9.2 mg/dL (ref 8.9–10.3)
CHLORIDE: 108 mmol/L (ref 101–111)
CO2: 22 mmol/L (ref 22–32)
CO2: 25 mmol/L (ref 22–32)
CREATININE: 0.66 mg/dL (ref 0.44–1.00)
Calcium: 8.8 mg/dL — ABNORMAL LOW (ref 8.9–10.3)
Chloride: 110 mmol/L (ref 101–111)
Creatinine, Ser: 0.62 mg/dL (ref 0.44–1.00)
GFR calc Af Amer: >60 mL/min (ref >60)
GFR calc non Af Amer: >60 mL/min (ref >60)
GLUCOSE: 120 mg/dL — AB (ref 65–99)
Glucose, Bld: 109 mg/dL — ABNORMAL HIGH (ref 65–99)
POTASSIUM: 3.6 mmol/L (ref 3.5–5.1)
Potassium: 3.6 mmol/L (ref 3.5–5.1)
SODIUM: 137 mmol/L (ref 135–145)
SODIUM: 139 mmol/L (ref 135–145)
Total Bilirubin: 1.9 mg/dL — ABNORMAL HIGH (ref 0.3–1.2)
Total Bilirubin: 2.3 mg/dL — ABNORMAL HIGH (ref 0.3–1.2)
Total Protein: 7.2 g/dL (ref 6.5–8.1)
Total Protein: 7 g/dL (ref 6.5–8.1)

## 2016-07-25 LAB — CBC WITH DIFFERENTIAL/PLATELET
BASOS ABS: 0.1 10*3/uL (ref 0.0–0.1)
BASOS PCT: 1 %
Basophils Absolute: 0.1 10*3/uL (ref 0.0–0.1)
Basophils Relative: 1 %
EOS ABS: 0.9 10*3/uL — AB (ref 0.0–0.7)
EOS PCT: 7 %
Eosinophils Absolute: 0.7 10*3/uL (ref 0.0–0.7)
Eosinophils Relative: 7 %
HEMATOCRIT: 23.6 % — AB (ref 36.0–46.0)
HEMATOCRIT: 24 % — AB (ref 36.0–46.0)
HEMOGLOBIN: 8.7 g/dL — AB (ref 12.0–15.0)
Hemoglobin: 8.3 g/dL — ABNORMAL LOW (ref 12.0–15.0)
LYMPHS ABS: 2.6 10*3/uL (ref 0.7–4.0)
LYMPHS ABS: 3.4 10*3/uL (ref 0.7–4.0)
Lymphocytes Relative: 24 %
Lymphocytes Relative: 25 %
MCH: 30.6 pg (ref 26.0–34.0)
MCH: 31.9 pg (ref 26.0–34.0)
MCHC: 35.2 g/dL (ref 30.0–36.0)
MCHC: 36.3 g/dL — AB (ref 30.0–36.0)
MCV: 87.1 fL (ref 78.0–100.0)
MCV: 87.9 fL (ref 78.0–100.0)
MONO ABS: 1 10*3/uL (ref 0.1–1.0)
MONOS PCT: 10 %
MONOS PCT: 9 %
Monocytes Absolute: 1.3 10*3/uL — ABNORMAL HIGH (ref 0.1–1.0)
NEUTROS ABS: 6.2 10*3/uL (ref 1.7–7.7)
NEUTROS ABS: 8.2 10*3/uL — AB (ref 1.7–7.7)
NEUTROS PCT: 58 %
Neutrophils Relative %: 58 %
Platelets: 464 10*3/uL — ABNORMAL HIGH (ref 150–400)
Platelets: 485 10*3/uL — ABNORMAL HIGH (ref 150–400)
RBC: 2.71 MIL/uL — ABNORMAL LOW (ref 3.87–5.11)
RBC: 2.73 MIL/uL — AB (ref 3.87–5.11)
RDW: 17.9 % — ABNORMAL HIGH (ref 11.5–15.5)
RDW: 18.4 % — AB (ref 11.5–15.5)
WBC: 10.6 10*3/uL — ABNORMAL HIGH (ref 4.0–10.5)
WBC: 13.9 10*3/uL — AB (ref 4.0–10.5)

## 2016-07-25 LAB — RETICULOCYTES
RBC.: 2.71 MIL/uL — ABNORMAL LOW (ref 3.87–5.11)
RBC.: 2.73 MIL/uL — AB (ref 3.87–5.11)
RETIC COUNT ABSOLUTE: 382.1 10*3/uL — AB (ref 19.0–186.0)
RETIC CT PCT: 13 % — AB (ref 0.4–3.1)
Retic Count, Absolute: 354.9 10*3/uL — ABNORMAL HIGH (ref 19.0–186.0)
Retic Ct Pct: 14.1 % — ABNORMAL HIGH (ref 0.4–3.1)

## 2016-07-25 IMAGING — XA IR US GUIDE VASC ACCESS LEFT
1 series · 1 of 1 positions shown · non-contrast
Comparison: none

INDICATION: Poor venous access. Sickle cell anemia. Request PICC line placement.

[Series 300: line placements · 1 of 1 slices shown]
[im 1/1]
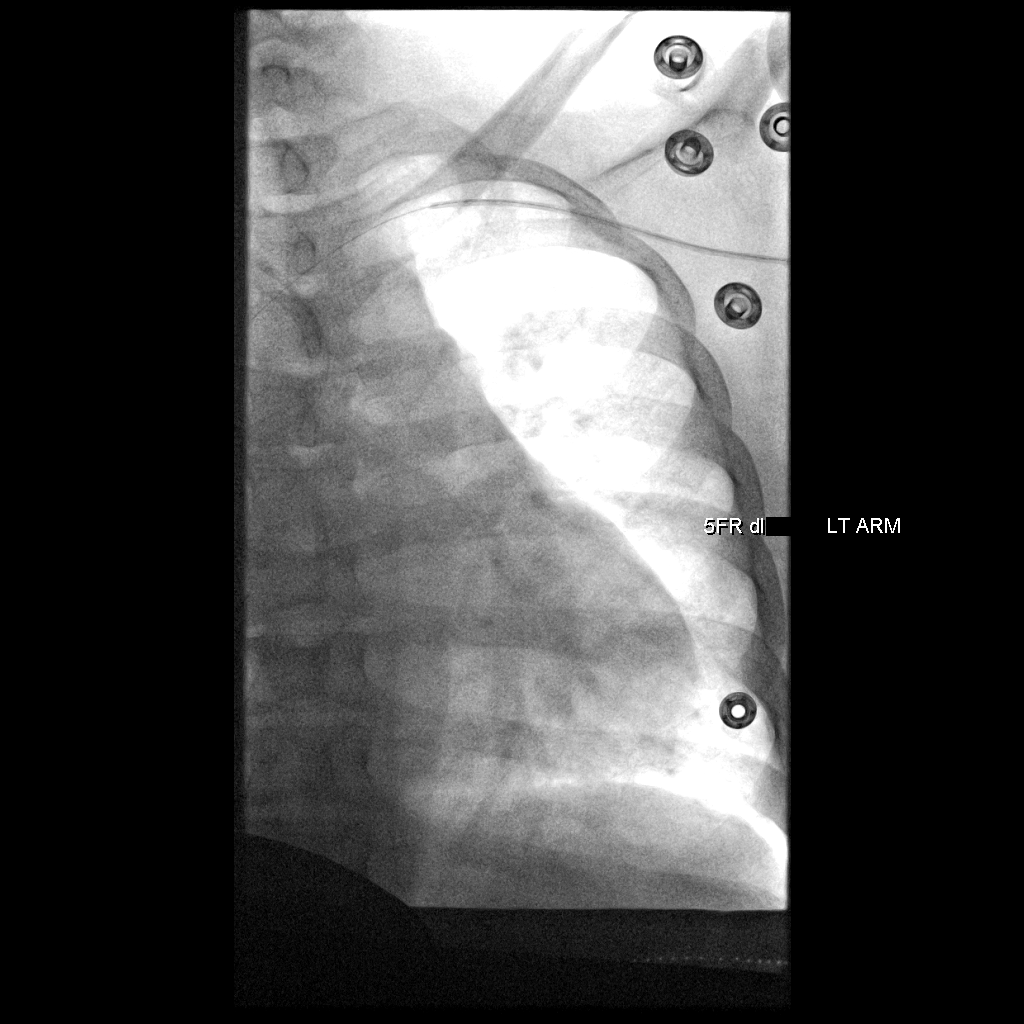

[1 of 1 positions shown; findings below may reference images not displayed]

EXAM:
LEFT UPPER EXTREMITY MIDLINE PICC LINE PLACEMENT WITH ULTRASOUND AND
FLUOROSCOPIC GUIDANCE

MEDICATIONS:
None; The antibiotic was administered within an appropriate time
interval prior to skin puncture.

ANESTHESIA/SEDATION:
Moderate Sedation Time:  None

The patient was continuously monitored during the procedure by the
interventional radiology nurse under my direct supervision.

FLUOROSCOPY TIME:  Fluoroscopy Time: 9 minutes 36 seconds

COMPLICATIONS:
None immediate.

PROCEDURE:
The patient was advised of the possible risks and complications and
agreed to undergo the procedure. The patient was then brought to the
angiographic suite for the procedure.

The left arm was prepped with chlorhexidine, draped in the usual
sterile fashion using maximum barrier technique (cap and mask,
sterile gown, sterile gloves, large sterile sheet, hand hygiene and
cutaneous antiseptic). Local anesthesia was attained by infiltration
with 1% lidocaine.

Ultrasound demonstrated patency of the brachial vein, and this was
documented with an image. Under real-time ultrasound guidance, this
vein was accessed with a 21 gauge micropuncture needle and image
documentation was performed. The needle was exchanged over a
guidewire for a peel-away sheath, however the catheter could not be
advanced into the SVC despite multiple attempts. Ultimately, the
decision was made to leave the catheter as a midline catheter and a
24 cm 5 French dual lumen PICC was advanced, and positioned with its
tip at the mid left subclavian. Fluoroscopy during the procedure and
fluoro spot radiograph confirms appropriate catheter position. The
catheter was flushed, secured to the skin, and covered with a
sterile dressing.
IMPRESSION: Successful placement of a left arm midline PICC with sonographic and
fluoroscopic guidance. The catheter is ready for use.

## 2016-07-25 MED ORDER — HYDROMORPHONE HCL 2 MG/ML IJ SOLN
2.0000 mg | Freq: Once | INTRAMUSCULAR | Status: AC
  Administered 2016-07-25: 2 mg via INTRAVENOUS
  Filled 2016-07-25: qty 1

## 2016-07-25 MED ORDER — SODIUM CHLORIDE 0.9% FLUSH: 9.0000 mL | INTRAVENOUS | Status: DC | PRN

## 2016-07-25 MED ORDER — DIPHENHYDRAMINE HCL 50 MG/ML IJ SOLN: 12.5000 mg | Freq: Four times a day (QID) | INTRAMUSCULAR | Status: DC | PRN

## 2016-07-25 MED ORDER — HYDROMORPHONE HCL 1 MG/ML IJ SOLN
2.0000 mg | INTRAMUSCULAR | Status: AC
  Administered 2016-07-25: 2 mg via INTRAVENOUS
  Filled 2016-07-25: qty 2

## 2016-07-25 MED ORDER — HYDROMORPHONE HCL 1 MG/ML IJ SOLN: 2.0000 mg | INTRAMUSCULAR | Status: AC

## 2016-07-25 MED ORDER — DIPHENHYDRAMINE HCL 50 MG/ML IJ SOLN
25.0000 mg | Freq: Once | INTRAMUSCULAR | Status: AC
  Administered 2016-07-25: 25 mg via INTRAVENOUS
  Filled 2016-07-25: qty 1

## 2016-07-25 MED ORDER — SODIUM CHLORIDE 0.45 % IV SOLN
INTRAVENOUS | Status: DC
  Administered 2016-07-25: 01:00:00 via INTRAVENOUS

## 2016-07-25 MED ORDER — SENNOSIDES-DOCUSATE SODIUM 8.6-50 MG PO TABS
1.0000 | ORAL_TABLET | Freq: Two times a day (BID) | ORAL | Status: DC
  Administered 2016-07-26 – 2016-07-29 (×7): 1 via ORAL
  Filled 2016-07-25 (×8): qty 1

## 2016-07-25 MED ORDER — HYDROMORPHONE HCL 2 MG/ML IJ SOLN: 2.0000 mg | Freq: Once | INTRAMUSCULAR | Status: DC

## 2016-07-25 MED ORDER — HYDROMORPHONE HCL 1 MG/ML IJ SOLN
2.0000 mg | INTRAMUSCULAR | Status: DC
  Administered 2016-07-25: 2 mg via INTRAVENOUS
  Filled 2016-07-25: qty 2

## 2016-07-25 MED ORDER — SODIUM CHLORIDE 0.45 % IV SOLN
INTRAVENOUS | Status: DC
  Administered 2016-07-25: 50 mL/h via INTRAVENOUS

## 2016-07-25 MED ORDER — HYDROMORPHONE HCL 1 MG/ML IJ SOLN: 1.0000 mg | Freq: Once | INTRAMUSCULAR | Status: DC

## 2016-07-25 MED ORDER — ASPIRIN-ACETAMINOPHEN-CAFFEINE 250-250-65 MG PO TABS
2.0000 | ORAL_TABLET | Freq: Four times a day (QID) | ORAL | Status: DC | PRN
  Filled 2016-07-25 (×2): qty 2

## 2016-07-25 MED ORDER — ONDANSETRON HCL 4 MG/2ML IJ SOLN
4.0000 mg | Freq: Once | INTRAMUSCULAR | Status: AC
  Administered 2016-07-25: 4 mg via INTRAVENOUS
  Filled 2016-07-25: qty 2

## 2016-07-25 MED ORDER — KETOROLAC TROMETHAMINE 30 MG/ML IJ SOLN
30.0000 mg | Freq: Four times a day (QID) | INTRAMUSCULAR | Status: DC
  Administered 2016-07-26 – 2016-07-29 (×15): 30 mg via INTRAVENOUS
  Filled 2016-07-25 (×16): qty 1

## 2016-07-25 MED ORDER — RIVAROXABAN 20 MG PO TABS
20.0000 mg | ORAL_TABLET | Freq: Every day | ORAL | Status: DC
  Administered 2016-07-26 – 2016-07-29 (×4): 20 mg via ORAL
  Filled 2016-07-25 (×4): qty 1

## 2016-07-25 MED ORDER — ONDANSETRON HCL 4 MG/2ML IJ SOLN: 4.0000 mg | Freq: Four times a day (QID) | INTRAMUSCULAR | Status: DC | PRN

## 2016-07-25 MED ORDER — DIPHENHYDRAMINE HCL 25 MG PO CAPS
25.0000 mg | ORAL_CAPSULE | ORAL | Status: DC | PRN
  Administered 2016-07-26: 50 mg via ORAL
  Filled 2016-07-25: qty 2

## 2016-07-25 MED ORDER — KETOROLAC TROMETHAMINE 30 MG/ML IJ SOLN
30.0000 mg | Freq: Once | INTRAMUSCULAR | Status: AC
  Administered 2016-07-25: 30 mg via INTRAVENOUS
  Filled 2016-07-25: qty 1

## 2016-07-25 MED ORDER — DIPHENHYDRAMINE HCL 12.5 MG/5ML PO ELIX: 12.5000 mg | ORAL_SOLUTION | Freq: Four times a day (QID) | ORAL | Status: DC | PRN

## 2016-07-25 MED ORDER — HYDROMORPHONE 1 MG/ML IV SOLN
INTRAVENOUS | Status: DC
  Administered 2016-07-26: 5.1 mg via INTRAVENOUS
  Administered 2016-07-26: via INTRAVENOUS
  Administered 2016-07-26: 0.6 mg via INTRAVENOUS
  Filled 2016-07-25: qty 25

## 2016-07-25 MED ORDER — HYDROMORPHONE HCL 1 MG/ML IJ SOLN
2.0000 mg | Freq: Once | INTRAMUSCULAR | Status: AC
  Administered 2016-07-25: 2 mg via INTRAVENOUS
  Filled 2016-07-25: qty 2

## 2016-07-25 MED ORDER — POLYETHYLENE GLYCOL 3350 17 G PO PACK: 17.0000 g | PACK | Freq: Every day | ORAL | Status: DC | PRN

## 2016-07-25 MED ORDER — HYDROXYUREA 500 MG PO CAPS
1000.0000 mg | ORAL_CAPSULE | Freq: Every day | ORAL | Status: DC
  Administered 2016-07-26 – 2016-07-27 (×2): 1000 mg via ORAL
  Filled 2016-07-25 (×2): qty 2

## 2016-07-25 MED ORDER — NALOXONE HCL 0.4 MG/ML IJ SOLN: 0.4000 mg | INTRAMUSCULAR | Status: DC | PRN

## 2016-07-25 MED ORDER — SODIUM CHLORIDE 0.9 % IV BOLUS (SEPSIS)
1000.0000 mL | Freq: Once | INTRAVENOUS | Status: AC
  Administered 2016-07-25: 1000 mL via INTRAVENOUS

## 2016-07-25 MED ORDER — KETOROLAC TROMETHAMINE 30 MG/ML IJ SOLN
15.0000 mg | Freq: Once | INTRAMUSCULAR | Status: AC
  Administered 2016-07-25: 15 mg via INTRAVENOUS
  Filled 2016-07-25: qty 1

## 2016-07-25 MED ORDER — VALACYCLOVIR HCL 500 MG PO TABS
1000.0000 mg | ORAL_TABLET | Freq: Every day | ORAL | Status: DC
  Administered 2016-07-26 – 2016-07-29 (×4): 1000 mg via ORAL
  Filled 2016-07-25 (×4): qty 2

## 2016-07-25 MED ORDER — DEXTROSE-NACL 5-0.45 % IV SOLN
INTRAVENOUS | Status: DC
  Administered 2016-07-26 – 2016-07-29 (×9): via INTRAVENOUS

## 2016-07-25 MED ORDER — HEPARIN SOD (PORK) LOCK FLUSH 100 UNIT/ML IV SOLN
500.0000 [IU] | Freq: Once | INTRAVENOUS | Status: AC
  Administered 2016-07-25: 500 [IU]
  Filled 2016-07-25: qty 5

## 2016-07-25 MED ORDER — OXYCODONE HCL 5 MG PO TABS: 15.0000 mg | ORAL_TABLET | Freq: Once | ORAL | Status: DC

## 2016-07-25 MED ORDER — KETOROLAC TROMETHAMINE 30 MG/ML IJ SOLN
30.0000 mg | INTRAMUSCULAR | Status: AC
  Administered 2016-07-25: 30 mg via INTRAVENOUS
  Filled 2016-07-25: qty 1

## 2016-07-25 MED ORDER — HYDROMORPHONE HCL 1 MG/ML IJ SOLN: 2.0000 mg | INTRAMUSCULAR | Status: DC

## 2016-07-25 MED ORDER — SODIUM CHLORIDE 0.9 % IV SOLN: 25.0000 mg | INTRAVENOUS | Status: DC | PRN

## 2016-07-25 NOTE — ED Triage Notes (Signed)
Pt to ED with c/o sickle cell pain in right knee.  Onset yesterday. Was seen at [facility] but st's she was not happy with her treatment there so she came here.

## 2016-07-25 NOTE — ED Notes (Signed)
Made Dr Laneta Simmers aware that patient would like to be discharged.

## 2016-07-25 NOTE — ED Provider Notes (Signed)
[facility] DEPT Provider Note   CSN: [id number] Arrival date & time: 07/25/16  1809     History   Chief Complaint Chief Complaint  Patient presents with  . Sickle Cell Pain Crisis    HPI Daryan Cagley Coryell is a 26 y.o. female.  Patient is a 26 year old female with history of sickle cell disease. She presents for evaluation of right knee pain. This began several days ago in the absence of any injury or trauma. She has attempted to manage her pain at home, however is been unsuccessful. She was seen early this a.m. and treated for a sickle cell crisis. She returns today complaining that her pain is no better.  She was seen at [facility] just prior to coming here. She had laboratory studies done which were consistent with previous studies. She did not stay for further treatment as she did not like the treating physician.   The history is provided by the patient.  Sickle Cell Pain Crisis  Pain location: Right knee. Severity:  Severe Duration:  3 days Similar to previous crisis episodes: yes   Timing:  Constant Progression:  Worsening Chronicity:  Recurrent Relieved by:  Nothing Worsened by:  Activity and movement Ineffective treatments:  None tried   Past Medical History:  Diagnosis Date  . Blood transfusion    "lots"  . Blood transfusion without reported diagnosis   . Chronic back pain    "very severe; have knot in my back; from tight muscle; take RX and exercise for it"  . Depression 01/06/2011  . Exertional dyspnea    "sometimes"  . Genital HSV   . GERD (gastroesophageal reflux disease) 02/17/2011  . Migraines 11/08/11   "@ least twice/month"  . 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  . Mood swings ([place]) 11/08/11   "I go back and forth; real bad"  . Sickle cell anemia ([name])   . Sickle cell anemia with crisis ([name])   . Trichotillomania    h/o    Patient Active Problem List   Diagnosis Date Noted  . Sickle cell crisis ([place]) 06/26/2016    . Infiltrate of lung present on imaging of chest 06/26/2016  . Chronic anticoagulation   . Sickle cell anemia ([place]) 05/19/2016  . History of DVT (deep vein thrombosis) 04/17/2016  . Post partum depression 04/03/2016  . Episode of recurrent major depressive disorder ([place])   . Sickle cell anemia with pain ([place]) 02/16/2016  . Thrombocytosis ([place]) 11/09/2015  . Hyperbilirubinemia 11/09/2015  . Narcotic dependence ([place]) 10/06/2015  . 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 High risk for intrapartum complications, antepartum 08/12/2015  . Chronic pain 08/04/2015  . Cocaine use 07/24/2015  . Herpes simplex 07/14/2015  . Anemia of chronic disease   . 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 Sickle cell disease ([place]) 01/08/2009  . 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 CHOLECYSTECTOMY  05/2010  . DILATION AND CURETTAGE OF UTERUS  02/20/11   S/P miscarriage  . IR GENERIC HISTORICAL  12/23/2015   IR FLUORO GUIDE CV LINE RIGHT 12/23/2015 Jacqulynn Cadet, MD [facility]  . IR GENERIC HISTORICAL  12/23/2015   IR US GUIDE VASC ACCESS RIGHT 12/23/2015 Jacqulynn Cadet, MD [facility]    OB History    Gravida Para Term Preterm AB Living   3 2 2   1 2    SAB TAB Ectopic Multiple Live Births  1     0 2      Obstetric Comments   Miscarried in October 2012 at about 7 weeks       Home Medications    Prior to Admission medications   Medication Sig Start Date End Date Taking? Authorizing Provider  aspirin-acetaminophen-caffeine (EXCEDRIN MIGRAINE) [phone] MG tablet Take 2 tablets by mouth every 6 (six) hours as needed for headache.     Historical Provider, MD  hydroxyurea (HYDREA) 500 MG capsule Take 2 capsules (1,000 mg total) by mouth daily. May take with food to minimize GI side effects. 04/18/16   Tresa Garter, MD  ibuprofen (ADVIL,MOTRIN) 600 MG tablet Take 1 tablet (600 mg  total) by mouth every 6 (six) hours as needed for mild pain or moderate pain. Patient not taking: Reported on 07/25/2016 03/14/16   Tresa Garter, MD  morphine (MS CONTIN) 30 MG 12 hr tablet Take 1 tablet (30 mg total) by mouth every 12 (twelve) hours. Patient not taking: Reported on 07/13/2016 05/31/16   Tresa Garter, MD  morphine (MS CONTIN) 60 MG 12 hr tablet Take 60 mg by mouth every 12 (twelve) hours.  07/05/16   Historical Provider, MD  Multiple Vitamins-Minerals (MULTIVITAMIN ADULTS PO) Take 1 tablet by mouth daily.    Historical Provider, MD  oxyCODONE (ROXICODONE) 15 MG immediate release tablet Take 15 mg by mouth every 4 (four) hours as needed for pain.  06/18/16   Historical Provider, MD  rivaroxaban (XARELTO) 20 MG TABS tablet Take 20 mg by mouth daily.     Historical Provider, MD  valACYclovir (VALTREX) 1000 MG tablet Take 1 tablet (1,000 mg total) by mouth daily. 05/02/16   [name], MD    Family History Family History  Problem Relation Age of Onset  . Sickle cell trait Mother   . Sickle cell trait Father   . Diabetes Maternal Grandmother   . Diabetes Paternal Grandmother   . Hypertension Paternal Grandmother   . Diabetes Maternal Grandfather     Social History Social History  Substance Use Topics  . Smoking status: Former Smoker    Packs/day: 0.25    Years: 1.00    Types: Cigarettes    Quit date: 03/25/2013  . Smokeless tobacco: Never Used  . Alcohol use Yes     Comment: Twice a month     Allergies   Food and Latex   Review of Systems Review of Systems  All other systems reviewed and are negative.    Physical Exam Updated Vital Signs BP 113/62 (BP Location: Left Arm)   Pulse 96   Temp 99.7 F (37.6 C) (Oral)   Resp 20   SpO2 100%   Physical Exam  Constitutional: She is oriented to person, place, and time. She appears well-developed and well-nourished. No distress.  HENT:  Head: Normocephalic and atraumatic.  Neck: Normal  range of motion. Neck supple.  Cardiovascular: Normal rate and regular rhythm.  Exam reveals no gallop and no friction rub.   No murmur heard. Pulmonary/Chest: Effort normal and breath sounds normal. No respiratory distress. She has no wheezes.  Abdominal: Soft. Bowel sounds are normal. She exhibits no distension. There is no tenderness.  Musculoskeletal: Normal range of motion.  The right knee appears to have a small effusion. There is pain with range of motion. No redness or significant warmth.   Neurological: She is alert and oriented to person, place, and time.  Skin: Skin is warm and dry. She is  not diaphoretic.  Nursing note and vitals reviewed.    ED Treatments / Results  Labs (all labs ordered are listed, but only abnormal results are displayed) Labs Reviewed - No data to display  EKG  EKG Interpretation None       Radiology No results found.  Procedures Procedures (including critical care time)  Medications Ordered in ED Medications  sodium chloride 0.9 % bolus 1,000 mL (not administered)  HYDROmorphone (DILAUDID) injection 2 mg (not administered)  diphenhydrAMINE (BENADRYL) injection 25 mg (not administered)  ondansetron (ZOFRAN) injection 4 mg (not administered)  ketorolac (TORADOL) 30 MG/ML injection 30 mg (not administered)     Initial Impression / Assessment and Plan / ED Course  I have reviewed the triage vital signs and the nursing notes.  Pertinent labs & imaging results that were available during my care of the patient were reviewed by me and considered in my medical decision making (see chart for details).  Patient pain unable to be adequately controlled in the ER. I've discussed this with Dr. Tamala Julian from the hospitalist service who agrees to admit for treatment of her sickle cell crisis.  Final Clinical Impressions(s) / ED Diagnoses   Final diagnoses:  None    New Prescriptions New Prescriptions   No medications on file     Veryl Speak,  MD 07/25/16 2236

## 2016-07-25 NOTE — ED Triage Notes (Signed)
Patient is complaining of sickle cell pain in the right knee that started yesterday morning when she woke up. Pt denies recent injury. Pt reports she was at [facility] ED last night for the pain. Pt reports she has took Oxycodone 15mg  around 10:00am.

## 2016-07-25 NOTE — ED Provider Notes (Signed)
[facility] DEPT Provider Note   CSN: [id number] Arrival date & time: 07/25/16  1542     History   Chief Complaint Chief Complaint  Patient presents with  . Sickle Cell Pain Crisis    HPI [name] is a 26 y.o. female.  The history is provided by the patient.  Sickle Cell Pain Crisis  Location:  Lower extremity Severity:  Moderate Onset quality:  Gradual Duration:  2 days Similar to previous crisis episodes: yes   Timing:  Constant Progression:  Unchanged Chronicity:  Chronic Sickle cell genotype:  SS Frequency of attacks:  Weekly Relieved by:  Nothing Worsened by:  Nothing Ineffective treatments: oxycodone and MS ER. Associated symptoms: no chest pain, no fever, no headaches, no shortness of breath, no swelling of legs and no vomiting   Risk factors: frequent pain crises     Past Medical History:  Diagnosis Date  . Blood transfusion    "lots"  . Blood transfusion without reported diagnosis   . Chronic back pain    "very severe; have knot in my back; from tight muscle; take RX and exercise for it"  . Depression 01/06/2011  . Exertional dyspnea    "sometimes"  . Genital HSV   . GERD (gastroesophageal reflux disease) 02/17/2011  . Migraines 11/08/11   "@ least twice/month"  . 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  . Mood swings ([place]) 11/08/11   "I go back and forth; real bad"  . Sickle cell anemia ([name])   . Sickle cell anemia with crisis ([place])   . Trichotillomania    h/o    Patient Active Problem List   Diagnosis Date Noted  . Sickle cell crisis ([place]) 06/26/2016  . Infiltrate of lung present on imaging of chest 06/26/2016  . Chronic anticoagulation   . Sickle cell anemia ([place]) 05/19/2016  . History of DVT (deep vein thrombosis) 04/17/2016  . Post partum depression 04/03/2016  . Episode of recurrent major depressive disorder ([place])   . Sickle cell anemia with pain ([place]) 02/16/2016  . Thrombocytosis ([place]) 11/09/2015  .  Hyperbilirubinemia 11/09/2015  . Narcotic dependence ([place]) 10/06/2015  . 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 High risk for intrapartum complications, antepartum 08/12/2015  . Chronic pain 08/04/2015  . Cocaine use 07/24/2015  . Herpes simplex 07/14/2015  . Anemia of chronic disease   . 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 Sickle cell disease ([place]) 01/08/2009  . 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 CHOLECYSTECTOMY  05/2010  . DILATION AND CURETTAGE OF UTERUS  02/20/11   S/P miscarriage  . IR GENERIC HISTORICAL  12/23/2015   IR FLUORO GUIDE CV LINE RIGHT 12/23/2015 Jacqulynn Cadet, MD [facility]  . IR GENERIC HISTORICAL  12/23/2015   IR US GUIDE VASC ACCESS RIGHT 12/23/2015 Jac[facility]lynn Cadet, MD [facility]    OB History    Gravida Para Term Preterm AB Living   3 2 2   1 2    SAB TAB Ectopic Multiple Live Births   1     0 2      Obstetric Comments   Miscarried in October 2012 at about 7 weeks       Home Medications    Prior to Admission medications   Medication Sig Start Date End Date Taking? Authorizing Provider  aspirin-acetaminophen-caffeine (EXCEDRIN MIGRAINE) [phone] MG tablet Take 2 tablets by mouth every 6 (six) hours  as needed for headache.    Yes Historical Provider, MD  hydroxyurea (HYDREA) 500 MG capsule Take 2 capsules (1,000 mg total) by mouth daily. May take with food to minimize GI side effects. 04/18/16  Yes Tresa Garter, MD  morphine (MS CONTIN) 60 MG 12 hr tablet Take 60 mg by mouth every 12 (twelve) hours.  07/05/16  Yes Historical Provider, MD  Multiple Vitamins-Minerals (MULTIVITAMIN ADULTS PO) Take 1 tablet by mouth daily.   Yes Historical Provider, MD  oxyCODONE (ROXICODONE) 15 MG immediate release tablet Take 15 mg by mouth every 4 (four) hours as needed for pain.  06/18/16  Yes Historical Provider, MD  rivaroxaban (XARELTO) 20 MG TABS  tablet Take 20 mg by mouth daily.    Yes Historical Provider, MD  valACYclovir (VALTREX) 1000 MG tablet Take 1 tablet (1,000 mg total) by mouth daily. 05/02/16  Yes [name], MD  ibuprofen (ADVIL,MOTRIN) 600 MG tablet Take 1 tablet (600 mg total) by mouth every 6 (six) hours as needed for mild pain or moderate pain. Patient not taking: Reported on 07/25/2016 03/14/16   Tresa Garter, MD  morphine (MS CONTIN) 30 MG 12 hr tablet Take 1 tablet (30 mg total) by mouth every 12 (twelve) hours. Patient not taking: Reported on 07/13/2016 05/31/16   Tresa Garter, MD    Family History Family History  Problem Relation Age of Onset  . Sickle cell trait Mother   . Sickle cell trait Father   . Diabetes Maternal Grandmother   . Diabetes Paternal Grandmother   . Hypertension Paternal Grandmother   . Diabetes Maternal Grandfather     Social History Social History  Substance Use Topics  . Smoking status: Former Smoker    Packs/day: 0.25    Years: 1.00    Types: Cigarettes    Quit date: 03/25/2013  . Smokeless tobacco: Never Used  . Alcohol use Yes     Comment: Twice a month     Allergies   Food and Latex   Review of Systems Review of Systems  Constitutional: Negative for fever.  Respiratory: Negative for shortness of breath.   Cardiovascular: Negative for chest pain.  Gastrointestinal: Negative for vomiting.  Neurological: Negative for headaches.  All other systems reviewed and are negative.    Physical Exam Updated Vital Signs BP 125/67 (BP Location: Left Arm)   Pulse 95   Temp 99.5 F (37.5 C) (Oral)   Resp 20   Ht 5\' 2"  (1.575 m)   Wt 180 lb (81.6 kg)   SpO2 97%   BMI 32.92 kg/m   Physical Exam  Constitutional: She is oriented to person, place, and time. She appears well-developed and well-nourished. No distress.  HENT:  Head: Normocephalic.  Nose: Nose normal.  Eyes: Conjunctivae are normal.  Neck: Neck supple. No tracheal deviation present.    Cardiovascular: Normal rate and regular rhythm.   Pulmonary/Chest: Effort normal. No respiratory distress.  Abdominal: Soft. She exhibits no distension.  Musculoskeletal:       Right knee: She exhibits normal range of motion, no swelling, no effusion and no deformity.  Neurological: She is alert and oriented to person, place, and time.  Skin: Skin is warm and dry.  Psychiatric: She has a normal mood and affect.  Vitals reviewed.    ED Treatments / Results  Labs (all labs ordered are listed, but only abnormal results are displayed) Labs Reviewed  COMPREHENSIVE METABOLIC PANEL - Abnormal; Notable for the following:  Result Value   Glucose, Bld 120 (*)    Calcium 8.8 (*)    Total Bilirubin 2.3 (*)    All other components within normal limits  CBC WITH DIFFERENTIAL/PLATELET - Abnormal; Notable for the following:    WBC 10.6 (*)    RBC 2.71 (*)    Hemoglobin 8.3 (*)    HCT 23.6 (*)    RDW 18.4 (*)    Platelets 464 (*)    All other components within normal limits  RETICULOCYTES - Abnormal; Notable for the following:    Retic Ct Pct 14.1 (*)    RBC. 2.71 (*)    Retic Count, Manual 382.1 (*)    All other components within normal limits    EKG  EKG Interpretation None       Radiology No results found.  Procedures Procedures (including critical care time)  Medications Ordered in ED Medications  0.45 % sodium chloride infusion ( Intravenous Stopped 07/25/16 1746)  oxyCODONE (Oxy IR/ROXICODONE) immediate release tablet 15 mg (15 mg Oral Refused 07/25/16 1741)  ketorolac (TORADOL) 30 MG/ML injection 15 mg (15 mg Intravenous Given 07/25/16 1637)  HYDROmorphone (DILAUDID) injection 2 mg (2 mg Intravenous Given 07/25/16 1637)  heparin lock flush 100 unit/mL (500 Units Intracatheter Given 07/25/16 1743)     Initial Impression / Assessment and Plan / ED Course  I have reviewed the triage vital signs and the nursing notes.  Pertinent labs & imaging results that were  available during my care of the patient were reviewed by me and considered in my medical decision making (see chart for details).     Patient presents with pain typical of sickle cell pain starting yesterday over right knee. Home pain meds not working effectively. No shortness of breath, no fevers, no signs of acute chest and Pt otherwise in NAD objectively other than complaint of pain. CBC, Retic count to assess for aplastic crisis. Given IVF and pain medicine with plan to admit vs discharge based on response and results of testing.  Of note, patient feels with chronic daily pain and has 19 visits to the ED in 6 months. She is averaging roughly one visit per week over the last few months which is more consistent with chronic pain then exacerbations of acute sickle cell crisis. She has switched primary care physicians and left the sickle cell clinic. Dr. Noah Delaine has doubled her oxycodone 15 mg prescription to 180 tablets for 30 days supply and doubled her morphine sulfate ER prescription to 60 mg every 12 hours. She has received 120x 30 and 60 mg MS Contin pills and 480x 15 mg oxycodone pills from 3 different prescribers since January 1.  At this time she appears to be high risk for high ED utilization for chronic pain. After a dose of IV narcotic pain medication I offered the patient her home oral medication regimen and admission to continue monitoring if needed but do not feel that further IV narcotic administration is warranted given her current appearance. Patient declined home oxycodone therapy and is requesting discharge. Plan to follow up with PCP as needed and return precautions discussed for worsening or new concerning symptoms.   Final Clinical Impressions(s) / ED Diagnoses   Final diagnoses:  Sickle cell anemia with pain ([name])  Chronic pain syndrome    New Prescriptions New Prescriptions   No medications on file     Leo Grosser, MD 07/26/16 [phone]

## 2016-07-25 NOTE — ED Notes (Signed)
Pt came in through ED doors from lobby.  RN from [facility] called this RN stating pt had come through doors and was demanding a room since she had seen other patients come back before she did.  POD A RN brought pt to D33, pt given a gown to change into.

## 2016-07-25 NOTE — Discharge Instructions (Signed)
Drink plenty of fluids. Take your medications as prescribed. Recheck if you get worse or get chest pain, cough or fever.

## 2016-07-25 NOTE — ED Notes (Signed)
Patient requesting to see another EDP due to Dr Laneta Simmers making her feel she is pain medication seeking and he will not be following the 3 dose protocol.   Made Sports administrator aware.  Matt RN and this RN went and spoke with Dr Laneta Simmers.  Dr Lita Mains and Dr Oleta Mouse agreed with Dr Jonelle Sports treatmet plan.  Matt RN and this RN went and made patient aware of discussion with EDPs.

## 2016-07-25 NOTE — ED Notes (Signed)
[facility] contacted for tx to [facility]; Tamala Julian sending, [facility] receiving

## 2016-07-25 NOTE — ED Notes (Signed)
[facility] given report to transport patient to 3W at [facility] long. Belongings taken with patient by transport service.

## 2016-07-25 NOTE — H&P (Signed)
History and Physical    [name]:[id number] DOB: [birth year] DOA: 07/25/2016  Referring MD/NP/PA: Dr. Stark Jock PCP: Angelica Chessman, MD  Patient coming from: Home  Chief Complaint: Right knee pain  HPI: [name] is a 26 y.o. female with medical history significant of sickle cell anemia requiring blood transfusions, pain crises, DVT on Xarelto, HSV2; who presents with right knee pain since yesterday morning. Pain is described as tingling/numbness feeling that is constant and will radiate down to her foot. Rates pain as a 8 out of 10 at its worst. Pain is worse with ambulation and movement of the left knee. Tried utilizing home oral medications of oxycodone and MS Contin without relief of symptoms. Denies any recent falls/ trauma to the affected knee, chest pain, fever, chills, vomiting, shortness of breath, or cough.  This is not where her sickle cell pain normally occurs. Associated symptoms include swelling the right knee. Patient reports this is the third time being evaluated in the last 24 hours. She was first seen at the emergency department, given pain medication with relief of symptoms, and was able to be discharged home. However, the pain returned and she went to [name] emergency department, but reports not being given any pain medications and therefore left. She then came to [facility] for further treatment. She sees Dr. Ricke Hey and Dr. Zigmund Daniel for her sickle cell care.   ED Course: On admission his emergency department patient was seen to be afebrile, pulse from 91-121, and other vitals within normal limits. Lab work revealed WBC 10.6, hemoglobin 8.3, platelets 464, total bilirubin 2.3, and all other labs relatively within normal limits. Patient was given 4 doses of 2 mg of Dilaudid while in the ED. Transfer to [facility] to telemetry bed.  Review of Systems: As per HPI otherwise 10 point review of systems negative.   Past Medical History:  Diagnosis Date   . Blood transfusion    "lots"  . Blood transfusion without reported diagnosis   . Chronic back pain    "very severe; have knot in my back; from tight muscle; take RX and exercise for it"  . Depression 01/06/2011  . Exertional dyspnea    "sometimes"  . Genital HSV   . GERD (gastroesophageal reflux disease) 02/17/2011  . Migraines 11/08/11   "@ least twice/month"  . 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  . Mood swings ([place]) 11/08/11   "I go back and forth; real bad"  . Sickle cell anemia ([name])   . Sickle cell anemia with crisis ([place])   . Trichotillomania    h/o    Past Surgical History:  Procedure Laterality Date  . CHOLECYSTECTOMY  05/2010  . DILATION AND CURETTAGE OF UTERUS  02/20/11   S/P miscarriage  . IR GENERIC HISTORICAL  12/23/2015   IR FLUORO GUIDE CV LINE RIGHT 12/23/2015 Jacqulynn Cadet, MD [facility]  . IR GENERIC HISTORICAL  12/23/2015   IR US GUIDE VASC ACCESS RIGHT 12/23/2015 Jacqulynn Cadet, MD [facility]     reports that she quit smoking about 3 years ago. Her smoking use included Cigarettes. She has a 0.25 pack-year smoking history. She has never used smokeless tobacco. She reports that she drinks alcohol. She reports that she does not use drugs.  Allergies  Allergen Reactions  . Food Hives and Other (See Comments)    Pt states that she is allergic to carrots.   . Latex Rash    Family History  Problem  Relation Age of Onset  . Sickle cell trait Mother   . Sickle cell trait Father   . Diabetes Maternal Grandmother   . Diabetes Paternal Grandmother   . Hypertension Paternal Grandmother   . Diabetes Maternal Grandfather     Prior to Admission medications   Medication Sig Start Date End Date Taking? Authorizing Provider  aspirin-acetaminophen-caffeine (EXCEDRIN MIGRAINE) [phone] MG tablet Take 2 tablets by mouth every 6 (six) hours as needed for headache.    Yes Historical Provider, MD  BIOTIN PO Take 2 tablets by mouth daily.   Yes  Historical Provider, MD  hydroxyurea (HYDREA) 500 MG capsule Take 2 capsules (1,000 mg total) by mouth daily. May take with food to minimize GI side effects. 04/18/16  Yes Tresa Garter, MD  morphine (MS CONTIN) 60 MG 12 hr tablet Take 60 mg by mouth every 12 (twelve) hours.  07/05/16  Yes Historical Provider, MD  Multiple Vitamins-Minerals ([employer] DAILY ENERGY) TABS Take 2 tablets by mouth daily.   Yes Historical Provider, MD  oxyCODONE (ROXICODONE) 15 MG immediate release tablet Take 15 mg by mouth every 4 (four) hours as needed for pain.  06/18/16  Yes Historical Provider, MD  Phytosterols ([employer] ADVANTAGE PO) Take 2 tablets by mouth daily.   Yes Historical Provider, MD  rivaroxaban (XARELTO) 20 MG TABS tablet Take 20 mg by mouth daily.    Yes Historical Provider, MD  valACYclovir (VALTREX) 1000 MG tablet Take 1 tablet (1,000 mg total) by mouth daily. 05/02/16  Yes [name], MD    Physical Exam:    Constitutional: Young female who appears to be in discomfort. Vitals:   07/25/16 1816 07/25/16 2030  BP: 113/62 118/68  Pulse: 96 90  Resp: 20 18  Temp: 99.7 F (37.6 C)   TempSrc: Oral   SpO2: 100% 100%   Eyes: PERRL, lids and conjunctivae normal ENMT: Mucous membranes are moist. Posterior pharynx clear of any exudate or lesions. Normal dentition.  Neck: normal, supple, no masses, no thyromegaly Respiratory: clear to auscultation bilaterally, no wheezing, no crackles. Normal respiratory effort. No accessory muscle use.  Cardiovascular: Tachycardic, no murmurs / rubs / gallops. No extremity edema. 2+ pedal pulses. No carotid bruits.  Abdomen: no tenderness, no masses palpated. No hepatosplenomegaly. Bowel sounds positive.  Musculoskeletal: no clubbing / cyanosis. Mild swelling of the right knee. No significant joint deformity upper and lower extremities. Good ROM, no contractures. Normal muscle tone.  Skin: no rashes, lesions, ulcers. No induration Neurologic: CN  2-12 grossly intact. Sensation intact, DTR normal. Strength 5/5 in all 4.  Psychiatric: Normal judgment and insight. Alert and oriented x 3. Normal mood.     Labs on Admission: I have personally reviewed following labs and imaging studies  CBC:  Recent Labs Lab 07/25/16 0018 07/25/16 1608  WBC 13.9* 10.6*  NEUTROABS 8.2* 6.2  HGB 8.7* 8.3*  HCT 24.0* 23.6*  MCV 87.9 87.1  PLT 485* 102*   Basic Metabolic Panel:  Recent Labs Lab 07/25/16 0018 07/25/16 1608  NA 139 137  K 3.6 3.6  CL 108 110  CO2 25 22  GLUCOSE 109* 120*  BUN 13 12  CREATININE 0.66 0.62  CALCIUM 9.2 8.8*   GFR: Estimated Creatinine Clearance: 107.3 mL/min (by C-G formula based on SCr of 0.62 mg/dL). Liver Function Tests:  Recent Labs Lab 07/25/16 0018 07/25/16 1608  AST 20 20  ALT 15 16  ALKPHOS 56 55  BILITOT 1.9* 2.3*  PROT 7.2  7.0  ALBUMIN 4.4 4.2   No results for input(s): LIPASE, AMYLASE in the last 168 hours. No results for input(s): AMMONIA in the last 168 hours. Coagulation Profile: No results for input(s): INR, PROTIME in the last 168 hours. Cardiac Enzymes: No results for input(s): CKTOTAL, CKMB, CKMBINDEX, TROPONINI in the last 168 hours. BNP (last 3 results) No results for input(s): PROBNP in the last 8760 hours. HbA1C: No results for input(s): HGBA1C in the last 72 hours. CBG: No results for input(s): GLUCAP in the last 168 hours. Lipid Profile: No results for input(s): CHOL, HDL, LDLCALC, TRIG, CHOLHDL, LDLDIRECT in the last 72 hours. Thyroid Function Tests: No results for input(s): TSH, T4TOTAL, FREET4, T3FREE, THYROIDAB in the last 72 hours. Anemia Panel:  Recent Labs  07/25/16 0018 07/25/16 1608  RETICCTPCT 13.0* 14.1*   Urine analysis:    Component Value Date/Time   COLORURINE YELLOW 07/13/2016 0948   APPEARANCEUR CLEAR 07/13/2016 0948   APPEARANCEUR Clear 05/06/2012 1546   LABSPEC 1.011 07/13/2016 0948   LABSPEC 1.011 05/06/2012 1546   PHURINE 5.0  07/13/2016 0948   GLUCOSEU NEGATIVE 07/13/2016 0948   GLUCOSEU Negative 05/06/2012 1546   HGBUR LARGE (A) 07/13/2016 0948   HGBUR negative 01/08/2009 [place] 07/13/2016 0948   BILIRUBINUR Negative 05/06/2012 1546   KETONESUR NEGATIVE 07/13/2016 0948   PROTEINUR NEGATIVE 07/13/2016 0948   UROBILINOGEN 2.0 (H) 10/26/2015 0817   NITRITE NEGATIVE 07/13/2016 0948   LEUKOCYTESUR TRACE (A) 07/13/2016 0948   LEUKOCYTESUR Negative 05/06/2012 1546   Sepsis Labs: No results found for this or any previous visit (from the past 240 hour(s)).   Radiological Exams on Admission: No results found.   Assessment/Plan Sickle cell Anemia with pain crisis/ right knee pain: Acute. Right knee pain unresolved with normal home medications. Hemoglobin noted to be a 8.3, which appears to be around patient's baseline. - Admit to telemetry bed at [facility] long - Sickle cell order set initiated  - Dilaudid PCA pump per protocol  - check right knee x-ray - Continue hydroxyurea  Leukocytosis: Acute. WBC initially was elevated at 13.9 repeat was noted to be 10.6 - Repeat CBC in a.m. - Check urinalysis  Tachycardia: Acute. Home heart rates in the 110-120's initially on admission. Suspect that this could be secondary to patient pain. - Check EKG  DVT: Patient reportedly diagnosed with a deep view of the right upper extremity which she was started on Xarelto and has been taking regularly - Continue Xarelto   Hyperbilirubinemia: chronic. Initial bilirubin 2.3 DVT prophylaxis: Xarelto  Code Status: Full Family Communication:  No family present at bedside Disposition Plan: Likely discharge home if medically stable  Consults called: none Admission status: Observation  Norval Morton MD [facility] Pager [phone]  If 7PM-7AM, please contact night-coverage [email or web address] Password [email or web address]  07/25/2016, 10:20 PM

## 2016-07-25 NOTE — ED Notes (Signed)
Patient made aware that EDP ordered Oxy 15mg . Patient reports that she has those at home ad she wants to go home now.  Patient requesting this RN to de-access her port a cath.

## 2016-07-25 NOTE — ED Provider Notes (Signed)
[facility] DEPT Provider Note   CSN: [id number] Arrival date & time: 07/24/16  2329   By signing my name below, I, Delton Prairie, attest that this documentation has been prepared under the direction and in the presence of Rolland Porter, MD  Electronically Signed: Delton Prairie, ED Scribe. 07/25/16. 12:50 AM.  Time seen 12:21 AM   History   Chief Complaint Chief Complaint  Patient presents with  . Sickle Cell Pain Crisis    The history is provided by the patient. No language interpreter was used.   HPI Comments:  [name] is a 26 y.o. female, with a hx of sickles cell anemia, who presents to the Emergency Department complaining of sudden onset, moderate, "8/10" sickle cell pain to her right lower extremity onset this morning. Pt states her pain begins at her right knee and radiates down to her right foot. She also reports tingling to these areas and states her pain is made worse if her heel is touched. She has had this pain before, but not as often as her usual back and LUE painful crisises. Pt reports her episodes of sickle cell pain crisis have been more frequent over the past year and states she thinks this is due to recent cocaine use (from November to January) and because of her pregnancy (she delivered in June, states she has 2 children only 10 months apart in age) and her husband was put in prison. Pt states she began using this drug due to stressful factors in her life but notes she has been clean for 2 months. She has taken 15 mg oxycontin 15 mg  and her usual medications including hydroxyurea today with no relief. Pt denies fevers, chest pain, SOB, cough, nausea, vomiting, diarrhea. She denies alcohol use or any other associated symptoms. Pt is a non-smoker.   PCP: Dr. Louretta Shorten   Past Medical History:  Diagnosis Date  . Blood transfusion    "lots"  . Blood transfusion without reported diagnosis   . Chronic back pain    "very severe; have knot in my back; from tight  muscle; take RX and exercise for it"  . Depression 01/06/2011  . Exertional dyspnea    "sometimes"  . Genital HSV   . GERD (gastroesophageal reflux disease) 02/17/2011  . Migraines 11/08/11   "@ least twice/month"  . 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  . Mood swings ([place]) 11/08/11   "I go back and forth; real bad"  . Sickle cell anemia ([name])   . Sickle cell anemia with crisis ([place])   . Trichotillomania    h/o    Patient Active Problem List   Diagnosis Date Noted  . Sickle cell crisis ([place]) 06/26/2016  . Infiltrate of lung present on imaging of chest 06/26/2016  . Chronic anticoagulation   . Sickle cell anemia ([place]) 05/19/2016  . History of DVT (deep vein thrombosis) 04/17/2016  . Post partum depression 04/03/2016  . Episode of recurrent major depressive disorder ([place])   . Sickle cell anemia with pain ([place]) 02/16/2016  . Thrombocytosis ([place]) 11/09/2015  . Hyperbilirubinemia 11/09/2015  . Narcotic dependence ([place]) 10/06/2015  . 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 High risk for intrapartum complications, antepartum 08/12/2015  . Chronic pain 08/04/2015  . Cocaine use 07/24/2015  . Herpes simplex 07/14/2015  . Anemia of chronic disease   . 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 Sickle cell disease ([place]) 01/08/2009  .  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 CHOLECYSTECTOMY  05/2010  . DILATION AND CURETTAGE OF UTERUS  02/20/11   S/P miscarriage  . IR GENERIC HISTORICAL  12/23/2015   IR FLUORO GUIDE CV LINE RIGHT 12/23/2015 Jacqulynn Cadet, MD [facility]INTER[facility] RAD  . IR GENERIC HISTORICAL  12/23/2015   IR US GUIDE VASC ACCESS RIGHT 12/23/2015 Jacqulynn Cadet, MD [facility]    OB History    Gravida Para Term Preterm AB Living   3 2 2   1 2    SAB TAB Ectopic Multiple Live Births   1     0 2      Obstetric Comments   Miscarried in October 2012 at about 7  weeks       Home Medications    Prior to Admission medications   Medication Sig Start Date End Date Taking? Authorizing Provider  aspirin-acetaminophen-caffeine (EXCEDRIN MIGRAINE) [phone] MG tablet Take 2 tablets by mouth every 6 (six) hours as needed for headache.    Yes Historical Provider, MD  hydroxyurea (HYDREA) 500 MG capsule Take 2 capsules (1,000 mg total) by mouth daily. May take with food to minimize GI side effects. 04/18/16  Yes Tresa Garter, MD  ibuprofen (ADVIL,MOTRIN) 600 MG tablet Take 1 tablet (600 mg total) by mouth every 6 (six) hours as needed for mild pain or moderate pain. 03/14/16  Yes Tresa Garter, MD  morphine (MS CONTIN) 60 MG 12 hr tablet Take 120 mg by mouth daily.  07/05/16  Yes Historical Provider, MD  oxyCODONE (ROXICODONE) 15 MG immediate release tablet Take 15 mg by mouth every 4 (four) hours as needed for pain.  06/18/16  Yes Historical Provider, MD  rivaroxaban (XARELTO) 20 MG TABS tablet Take 20 mg by mouth daily.    Yes Historical Provider, MD  valACYclovir (VALTREX) 1000 MG tablet Take 1 tablet (1,000 mg total) by mouth daily. Patient taking differently: Take 1,000 mg by mouth daily as needed (cold sores).  05/02/16  Yes [name], MD  morphine (MS CONTIN) 30 MG 12 hr tablet Take 1 tablet (30 mg total) by mouth every 12 (twelve) hours. Patient not taking: Reported on 07/13/2016 05/31/16   Tresa Garter, MD    Family History Family History  Problem Relation Age of Onset  . Sickle cell trait Mother   . Sickle cell trait Father   . Diabetes Maternal Grandmother   . Diabetes Paternal Grandmother   . Hypertension Paternal Grandmother   . Diabetes Maternal Grandfather     Social History Social History  Substance Use Topics  . Smoking status: Former Smoker    Packs/day: 0.25    Years: 1.00    Types: Cigarettes    Quit date: 03/25/2013  . Smokeless tobacco: Never Used  . Alcohol use No     Comment: pt states she quit  marijuan in May 2013. Rare ETOH, + cigarettes.  She is enrolled in school  on disability Cocaine abuse   Allergies   Food and Latex   Review of Systems Review of Systems  Constitutional: Negative for fever.  Respiratory: Negative for cough and shortness of breath.   Cardiovascular: Negative for chest pain.  Gastrointestinal: Negative for diarrhea, nausea and vomiting.  Musculoskeletal: Positive for myalgias.  All other systems reviewed and are negative.    Physical Exam Updated Vital Signs BP (!) 101/50   Pulse 110   Temp 98.3 F (36.8 C) (Oral)   Resp  20   SpO2 96%   Vital signs normal except for tachycardia   Physical Exam  Constitutional: She is oriented to person, place, and time. She appears well-developed and well-nourished.  Non-toxic appearance. She does not appear ill. No distress.  Smiling, cooperative, in NAD  HENT:  Head: Normocephalic and atraumatic.  Right Ear: External ear normal.  Left Ear: External ear normal.  Nose: Nose normal. No mucosal edema or rhinorrhea.  Mouth/Throat: Oropharynx is clear and moist and mucous membranes are normal. No dental abscesses or uvula swelling.  Eyes: Conjunctivae and EOM are normal. Pupils are equal, round, and reactive to light.  Neck: Normal range of motion and full passive range of motion without pain. Neck supple.  Cardiovascular: Normal rate, regular rhythm and normal heart sounds.  Exam reveals no gallop and no friction rub.   No murmur heard. Pulmonary/Chest: Effort normal and breath sounds normal. No respiratory distress. She has no wheezes. She has no rhonchi. She has no rales. She exhibits no tenderness and no crepitus.  Abdominal: Soft. Normal appearance and bowel sounds are normal. She exhibits no distension. There is no tenderness. There is no rebound and no guarding.  Musculoskeletal: Normal range of motion. She exhibits edema and tenderness.  Moves all extremities well. She has some mild diffuse swelling  of right knee without obvious effusion. No erythema of skin. No increased warmth. Ankle and foot not swollen  Neurological: She is alert and oriented to person, place, and time. She has normal strength. No cranial nerve deficit.  Skin: Skin is warm, dry and intact. No rash noted. No erythema. No pallor.  Psychiatric: She has a normal mood and affect. Her speech is normal and behavior is normal. Her mood appears not anxious.  Nursing note and vitals reviewed.    ED Treatments / Results  Labs (all labs ordered are listed, but only abnormal results are displayed) Results for orders placed or performed during the hospital encounter of 07/25/16  Comprehensive metabolic panel  Result Value Ref Range   Sodium 139 135 - 145 mmol/L   Potassium 3.6 3.5 - 5.1 mmol/L   Chloride 108 101 - 111 mmol/L   CO2 25 22 - 32 mmol/L   Glucose, Bld 109 (H) 65 - 99 mg/dL   BUN 13 6 - 20 mg/dL   Creatinine, Ser 0.66 0.44 - 1.00 mg/dL   Calcium 9.2 8.9 - 10.3 mg/dL   Total Protein 7.2 6.5 - 8.1 g/dL   Albumin 4.4 3.5 - 5.0 g/dL   AST 20 15 - 41 U/L   ALT 15 14 - 54 U/L   Alkaline Phosphatase 56 38 - 126 U/L   Total Bilirubin 1.9 (H) 0.3 - 1.2 mg/dL   GFR calc non Af Amer >60 >60 mL/min   GFR calc Af Amer >60 >60 mL/min   Anion gap 6 5 - 15  CBC with Differential  Result Value Ref Range   WBC 13.9 (H) 4.0 - 10.5 K/uL   RBC 2.73 (L) 3.87 - 5.11 MIL/uL   Hemoglobin 8.7 (L) 12.0 - 15.0 g/dL   HCT 24.0 (L) 36.0 - 46.0 %   MCV 87.9 78.0 - 100.0 fL   MCH 31.9 26.0 - 34.0 pg   MCHC 36.3 (H) 30.0 - 36.0 g/dL   RDW 17.9 (H) 11.5 - 15.5 %   Platelets 485 (H) 150 - 400 K/uL   Neutrophils Relative % 58 %   Neutro Abs 8.2 (H) 1.7 - 7.7 K/uL  Lymphocytes Relative 24 %   Lymphs Abs 3.4 0.7 - 4.0 K/uL   Monocytes Relative 10 %   Monocytes Absolute 1.3 (H) 0.1 - 1.0 K/uL   Eosinophils Relative 7 %   Eosinophils Absolute 0.9 (H) 0.0 - 0.7 K/uL   Basophils Relative 1 %   Basophils Absolute 0.1 0.0 - 0.1 K/uL    Reticulocytes  Result Value Ref Range   Retic Ct Pct 13.0 (H) 0.4 - 3.1 %   RBC. 2.73 (L) 3.87 - 5.11 MIL/uL   Retic Count, Manual 354.9 (H) 19.0 - 186.0 K/uL   *Note: Due to a large number of results and/or encounters for the requested time period, some results have not been displayed. A complete set of results can be found in Results Review.   Laboratory interpretation all normal except stable anemia, elevated retic count, leukocytosis   Radiology No results found.  Procedures Procedures (including critical care time)  Medications Ordered in ED Medications  0.45 % sodium chloride infusion ( Intravenous Stopped 07/25/16 0343)  HYDROmorphone (DILAUDID) injection 2 mg (2 mg Intravenous Given 07/25/16 0339)    Or  HYDROmorphone (DILAUDID) injection 2 mg ( Subcutaneous See Alternative 07/25/16 0339)  heparin lock flush 100 unit/mL (not administered)  ketorolac (TORADOL) 30 MG/ML injection 30 mg (30 mg Intravenous Given 07/25/16 0045)  HYDROmorphone (DILAUDID) injection 2 mg (2 mg Intravenous Given 07/25/16 0045)    Or  HYDROmorphone (DILAUDID) injection 2 mg ( Subcutaneous See Alternative 07/25/16 0045)  HYDROmorphone (DILAUDID) injection 2 mg (2 mg Intravenous Given 07/25/16 0139)    Or  HYDROmorphone (DILAUDID) injection 2 mg ( Subcutaneous See Alternative 07/25/16 0139)  HYDROmorphone (DILAUDID) injection 2 mg (2 mg Intravenous Given 07/25/16 0217)    Or  HYDROmorphone (DILAUDID) injection 2 mg ( Subcutaneous See Alternative 07/25/16 0217)  diphenhydrAMINE (BENADRYL) injection 25 mg (25 mg Intravenous Given 07/25/16 0044)  ondansetron (ZOFRAN) injection 4 mg (4 mg Intravenous Given 07/25/16 0045)  diphenhydrAMINE (BENADRYL) injection 25 mg (25 mg Intravenous Given 07/25/16 0225)     Initial Impression / Assessment and Plan / ED Course  I have reviewed the triage vital signs and the nursing notes.  Pertinent labs & imaging results that were available during my care of the patient were  reviewed by me and considered in my medical decision making (see chart for details).     DIAGNOSTIC STUDIES:  Oxygen Saturation is 100% on RA, normal by my interpretation.    COORDINATION OF CARE:  12:32 AM Discussed treatment plan with pt at bedside and pt agreed to plan. Pt was started on the sickle cell protocol with IV fluids and IV pain medications.   3:39 AM Pt states she feels better and notes her pain is down to a "6/10".  Her pain when initially seen was an "8/10". She states she is ready to be discharged.    Final Clinical Impressions(s) / ED Diagnoses   Final diagnoses:  Sickle cell crisis ([place])    Plan discharge  Rolland Porter, MD, FACEP   I personally performed the services described in this documentation, which was scribed in my presence. The recorded information has been reviewed and considered.  Rolland Porter, MD, Barbette Or, MD 07/25/16 [phone]

## 2016-07-25 NOTE — ED Notes (Signed)
Delo MD at bedside. 

## 2016-07-25 NOTE — ED Notes (Signed)
Per Almyra Free NT, patient stated that she couldn't wait any longer for discharge paperwork and left before receiving them.

## 2016-07-25 NOTE — ED Notes (Signed)
ED Provider at bedside. 

## 2016-07-26 ENCOUNTER — Encounter (HOSPITAL_COMMUNITY): Payer: Self-pay | Admitting: Nurse Practitioner

## 2016-07-26 DIAGNOSIS — Z91040 Latex allergy status: Secondary | ICD-10-CM | POA: Diagnosis not present

## 2016-07-26 DIAGNOSIS — Z91018 Allergy to other foods: Secondary | ICD-10-CM | POA: Diagnosis not present

## 2016-07-26 DIAGNOSIS — Z86718 Personal history of other venous thrombosis and embolism: Secondary | ICD-10-CM

## 2016-07-26 DIAGNOSIS — F329 Major depressive disorder, single episode, unspecified: Secondary | ICD-10-CM | POA: Diagnosis present

## 2016-07-26 DIAGNOSIS — Z7901 Long term (current) use of anticoagulants: Secondary | ICD-10-CM | POA: Diagnosis not present

## 2016-07-26 DIAGNOSIS — Z87891 Personal history of nicotine dependence: Secondary | ICD-10-CM | POA: Diagnosis not present

## 2016-07-26 DIAGNOSIS — Z79899 Other long term (current) drug therapy: Secondary | ICD-10-CM | POA: Diagnosis not present

## 2016-07-26 DIAGNOSIS — D638 Anemia in other chronic diseases classified elsewhere: Secondary | ICD-10-CM

## 2016-07-26 DIAGNOSIS — Z79891 Long term (current) use of opiate analgesic: Secondary | ICD-10-CM | POA: Diagnosis not present

## 2016-07-26 DIAGNOSIS — D5700 Hb-SS disease with crisis, unspecified: Principal | ICD-10-CM

## 2016-07-26 DIAGNOSIS — K219 Gastro-esophageal reflux disease without esophagitis: Secondary | ICD-10-CM | POA: Diagnosis not present

## 2016-07-26 DIAGNOSIS — D72829 Elevated white blood cell count, unspecified: Secondary | ICD-10-CM | POA: Diagnosis not present

## 2016-07-26 DIAGNOSIS — M79604 Pain in right leg: Secondary | ICD-10-CM | POA: Diagnosis not present

## 2016-07-26 DIAGNOSIS — Z7982 Long term (current) use of aspirin: Secondary | ICD-10-CM | POA: Diagnosis not present

## 2016-07-26 DIAGNOSIS — M79605 Pain in left leg: Secondary | ICD-10-CM | POA: Diagnosis not present

## 2016-07-26 DIAGNOSIS — R000 Tachycardia, unspecified: Secondary | ICD-10-CM | POA: Diagnosis not present

## 2016-07-26 LAB — BASIC METABOLIC PANEL
Anion gap: 3 — ABNORMAL LOW (ref 5–15)
BUN: 9 mg/dL (ref 6–20)
CHLORIDE: 111 mmol/L (ref 101–111)
CO2: 24 mmol/L (ref 22–32)
Calcium: 8.4 mg/dL — ABNORMAL LOW (ref 8.9–10.3)
Creatinine, Ser: 0.62 mg/dL (ref 0.44–1.00)
GFR calc Af Amer: >60 mL/min (ref >60)
GFR calc non Af Amer: >60 mL/min (ref >60)
Glucose, Bld: 109 mg/dL — ABNORMAL HIGH (ref 65–99)
POTASSIUM: 4.1 mmol/L (ref 3.5–5.1)
SODIUM: 138 mmol/L (ref 135–145)

## 2016-07-26 LAB — URINALYSIS, ROUTINE W REFLEX MICROSCOPIC
Bilirubin Urine: NEGATIVE
GLUCOSE, UA: NEGATIVE mg/dL
Hgb urine dipstick: NEGATIVE
Ketones, ur: NEGATIVE mg/dL
Nitrite: NEGATIVE
PH: 5 (ref 5.0–8.0)
Protein, ur: NEGATIVE mg/dL
SPECIFIC GRAVITY, URINE: 1.012 (ref 1.005–1.030)

## 2016-07-26 MED ORDER — SODIUM CHLORIDE 0.9 % IV SOLN
25.0000 mg | INTRAVENOUS | Status: DC | PRN
  Filled 2016-07-26: qty 0.5

## 2016-07-26 MED ORDER — DIPHENHYDRAMINE HCL 25 MG PO CAPS
25.0000 mg | ORAL_CAPSULE | ORAL | Status: DC | PRN
  Administered 2016-07-26 – 2016-07-27 (×2): 25 mg via ORAL
  Administered 2016-07-28 – 2016-07-29 (×2): 50 mg via ORAL
  Filled 2016-07-26: qty 2
  Filled 2016-07-26 (×2): qty 1
  Filled 2016-07-26: qty 2

## 2016-07-26 MED ORDER — OXYCODONE HCL 5 MG PO TABS
15.0000 mg | ORAL_TABLET | ORAL | Status: DC
  Administered 2016-07-26 – 2016-07-29 (×19): 15 mg via ORAL
  Filled 2016-07-26 (×19): qty 3

## 2016-07-26 MED ORDER — SODIUM CHLORIDE 0.9% FLUSH: 9.0000 mL | INTRAVENOUS | Status: DC | PRN

## 2016-07-26 MED ORDER — SODIUM CHLORIDE 0.9% FLUSH: 10.0000 mL | INTRAVENOUS | Status: DC | PRN

## 2016-07-26 MED ORDER — HYDROMORPHONE 1 MG/ML IV SOLN
INTRAVENOUS | Status: DC
  Administered 2016-07-26: 1.6 mg via INTRAVENOUS
  Administered 2016-07-26: 15 mg via INTRAVENOUS
  Administered 2016-07-26: 18:00:00 via INTRAVENOUS
  Administered 2016-07-26: 6 mg via INTRAVENOUS
  Administered 2016-07-27: 11:00:00 via INTRAVENOUS
  Administered 2016-07-27: 7.2 mg via INTRAVENOUS
  Administered 2016-07-27: 8.4 mg via INTRAVENOUS
  Administered 2016-07-27 (×2): 6 mg via INTRAVENOUS
  Administered 2016-07-27: 4.8 mg via INTRAVENOUS
  Administered 2016-07-27: 5.4 mg via INTRAVENOUS
  Administered 2016-07-28: via INTRAVENOUS
  Administered 2016-07-28: 2 mg via INTRAVENOUS
  Administered 2016-07-28: 4.5 mg via INTRAVENOUS
  Administered 2016-07-28: 1 mg via INTRAVENOUS
  Administered 2016-07-28: 21:00:00 via INTRAVENOUS
  Administered 2016-07-28: 6.5 mg via INTRAVENOUS
  Administered 2016-07-28: 9.5 mg via INTRAVENOUS
  Administered 2016-07-29: 6.6 mg via INTRAVENOUS
  Administered 2016-07-29: 10.6 mg via INTRAVENOUS
  Administered 2016-07-29: 10.5 mg via INTRAVENOUS
  Administered 2016-07-29: 7.8 mg via INTRAVENOUS
  Filled 2016-07-26 (×5): qty 25

## 2016-07-26 MED ORDER — ONDANSETRON HCL 4 MG/2ML IJ SOLN: 4.0000 mg | Freq: Four times a day (QID) | INTRAMUSCULAR | Status: DC | PRN

## 2016-07-26 MED ORDER — L-GLUTAMINE ORAL POWDER
15.0000 g | PACK | Freq: Two times a day (BID) | ORAL | Status: DC
  Administered 2016-07-27 – 2016-07-29 (×4): 15 g via ORAL
  Filled 2016-07-26 (×8): qty 3

## 2016-07-26 MED ORDER — NALOXONE HCL 0.4 MG/ML IJ SOLN: 0.4000 mg | INTRAMUSCULAR | Status: DC | PRN

## 2016-07-26 NOTE — Progress Notes (Signed)
SICKLE CELL SERVICE PROGRESS NOTE  [name] VXB:[id number] DOB: 1990/10/11 DOA: 07/25/2016 PCP: Angelica Chessman, MD  Assessment/Plan: Principal Problem:   Sickle cell anemia with crisis ([place]) Active Problems:   Leukocytosis   Hyperbilirubinemia   History of DVT (deep vein thrombosis)   Right leg pain  1. Hb SS with crisis: Continue PCA at current setting. Continue Toradol and start on Endari 15 g BID. Continue IVF.  2. Leukocytosis: Pt had a very mild leukocytosis on admission. This is likely a reflection of her crisis as she has no evidence of infection. She leukocytes in the urine which is likely chronic. However U/C pending. Pt asymptomatic so will await results of U/C to intiiate any treatment.  3. Anemia of Chronic Disease: Pt's Hb at baseline now that she is being compliant with Hydrea and additionally she has not had any recent menstrual bleeding. Will continue Hydrea and continue to monitor. Current RPI 3.7. 4. Chronic pain: Continue MS Contin and will also schedule Oxycodone as she takes it ATC at home.    Code Status: Full Code Family Communication: N/A Disposition Plan: Not yet ready for discharge  [place].  Pager [phone]. If 7PM-7AM, please contact night-coverage.  07/26/2016, 1:59 PM  LOS: 0 days   Interim History: Pt um ambulating with limp due to pain in right knee. She appears well- actually better than she has been on the prior recent admissions. She states that her pain is localized to her right knee which per patient was swollen 2 days ago. She rates the pain at an intensity of 7/10. She has used 13.8mg  with 39/29:demands/deliveries in the last 14 hours.    Consultants:  None  Procedures:  None  Antibiotics:  None   Objective: Vitals:   07/26/16 0027 07/26/16 0400 07/26/16 0550 07/26/16 0947  BP:   110/71 111/67  Pulse:   83 82  Resp: 15 14 14 16   Temp:   98.4 F (36.9 C) 98.6 F (37 C)  TempSrc:   Oral Oral  SpO2: 97%  98% 98% 98%  Weight:      Height:       Weight change:   Intake/Output Summary (Last 24 hours) at 07/26/16 1359 Last data filed at 07/26/16 0953  Gross per 24 hour  Intake           693.75 ml  Output             1000 ml  Net          -306.25 ml    General: Alert, awake, oriented x3, in mild acute distress due to pain in the left knee.  HEENT: [place]/AT PEERL, EOMI, anicteric.  Neck: Trachea midline,  no masses, no thyromegal,y no JVD, no carotid bruit OROPHARYNX:  Moist, No exudate/ erythema/lesions.  Heart: Regular rate and rhythm, without murmurs, rubs, gallops, PMI non-displaced, no heaves or thrills on palpation.  Lungs: Clear to auscultation, no wheezing or rhonchi noted. No increased vocal fremitus resonant to percussion  Abdomen: Soft, nontender, nondistended, positive bowel sounds, no masses no hepatosplenomegaly noted.  Neuro: No focal neurological deficits noted cranial nerves II through XII grossly intact.  Strength at functional baseline in bilateral upper and lower extremities. Musculoskeletal: No warmth swelling or erythema around joints, no spinal tenderness noted. Psychiatric: Patient alert and oriented x3, good insight and cognition, good recent to remote recall.    Data Reviewed: Basic Metabolic Panel:  Recent Labs Lab 07/25/16 0018 07/25/16 1608 07/26/16 0929  NA 139  137 138  K 3.6 3.6 4.1  CL 108 110 111  CO2 25 22 24   GLUCOSE 109* 120* 109*  BUN 13 12 9   CREATININE 0.66 0.62 0.62  CALCIUM 9.2 8.8* 8.4*   Liver Function Tests:  Recent Labs Lab 07/25/16 0018 07/25/16 1608  AST 20 20  ALT 15 16  ALKPHOS 56 55  BILITOT 1.9* 2.3*  PROT 7.2 7.0  ALBUMIN 4.4 4.2   No results for input(s): LIPASE, AMYLASE in the last 168 hours. No results for input(s): AMMONIA in the last 168 hours. CBC:  Recent Labs Lab 07/25/16 0018 07/25/16 1608  WBC 13.9* 10.6*  NEUTROABS 8.2* 6.2  HGB 8.7* 8.3*  HCT 24.0* 23.6*  MCV 87.9 87.1  PLT 485* 464*    Cardiac Enzymes: No results for input(s): CKTOTAL, CKMB, CKMBINDEX, TROPONINI in the last 168 hours. BNP (last 3 results) No results for input(s): BNP in the last 8760 hours.  ProBNP (last 3 results) No results for input(s): PROBNP in the last 8760 hours.  CBG: No results for input(s): GLUCAP in the last 168 hours.  No results found for this or any previous visit (from the past 240 hour(s)).   Studies: Dg Neck Soft Tissue  Result Date: 06/28/2016 CLINICAL DATA:  Neck pain. EXAM: NECK SOFT TISSUES - 1+ VIEW COMPARISON:  MRI 06/13/2016.  CT 06/10/2016. FINDINGS: Loss of normal cervical lordosis. No acute bony abnormality. Soft tissue structures are unremarkable. If symptoms remain contrast-enhanced neck CT can be obtained . Prior port catheter noted. Pulmonary apices are clear. IMPRESSION: 1. Loss of normal cervical lordosis. No acute or focal bony abnormality. 2. Soft tissue structures are unremarkable. Electronically Signed   By: Marcello Moores  Register   On: 06/28/2016 14:33   Dg Chest 2 View  Result Date: 06/26/2016 CLINICAL DATA:  Chest soreness for 1 day.  Sickle cell anemia. EXAM: CHEST  2 VIEW COMPARISON:  June 10, 2016 FINDINGS: Bony sclerosis, most prominent in the humeral head stomach consistent with known sickle cell. No pneumothorax. The heart size is mildly enlarged. The hila and mediastinum are normal. Mild opacity in the right mid lung not seen previously. No other acute pulmonary abnormalities. Right Port-A-Cath is in good position. IMPRESSION: 1. Right mid lung infiltrate.  Recommend follow-up to resolution. 2. Sickle cell changes to the bones. Electronically Signed   By: Dorise Bullion III M.D   On: 06/26/2016 20:19   Dg Knee Right Port  Result Date: 07/25/2016 CLINICAL DATA:  26 year old female with right knee pain and swelling. EXAM: PORTABLE RIGHT KNEE - 1-2 VIEW COMPARISON:  Radiograph dated 05/28/2013 FINDINGS: There is no acute fracture or dislocation. The bones are  well mineralized. Areas of heterogeneity in the proximal tibia again noted as seen on the prior exam. No arthritic changes. No joint effusion. The soft tissues appear unremarkable. IMPRESSION: No acute osseous pathology.  No interval change. Electronically Signed   By: Anner Crete M.D.   On: 07/25/2016 23:45    Scheduled Meds: . HYDROmorphone   Intravenous Q4H  . hydroxyurea  1,000 mg Oral Daily  . ketorolac  30 mg Intravenous Q6H  . L-glutamine  15 g Oral BID  . oxyCODONE  15 mg Oral Q4H  . rivaroxaban  20 mg Oral Q supper  . senna-docusate  1 tablet Oral BID  . valACYclovir  1,000 mg Oral Daily   Continuous Infusions: . dextrose 5 % and 0.45% NaCl 125 mL/hr at 07/26/16 1117    Principal Problem:  Sickle cell anemia with crisis [facility]) Active Problems:   Leukocytosis   Hyperbilirubinemia   History of DVT (deep vein thrombosis)   Right leg pain    In excess of 25 minutes spent during this visit. Greater than 50% involved face to face contact with the patient for assessment, counseling and coordination of care.

## 2016-07-26 NOTE — Progress Notes (Signed)
Pt. Transported by [facility]. No report received from RN at [facility] ED. [facility] Admit system paged to get diet orders for pt. PCA pump set up, pt. Is in bed resting. Will continue to monitor.

## 2016-07-27 DIAGNOSIS — M79605 Pain in left leg: Secondary | ICD-10-CM

## 2016-07-27 DIAGNOSIS — D72829 Elevated white blood cell count, unspecified: Secondary | ICD-10-CM

## 2016-07-27 MED ORDER — HYDROXYUREA 500 MG PO CAPS
1000.0000 mg | ORAL_CAPSULE | ORAL | Status: DC
  Administered 2016-07-29: 1000 mg via ORAL
  Filled 2016-07-27: qty 2

## 2016-07-27 MED ORDER — HYDROMORPHONE HCL 1 MG/ML IJ SOLN
1.0000 mg | Freq: Once | INTRAMUSCULAR | Status: AC
  Administered 2016-07-27: 1 mg via INTRAVENOUS
  Filled 2016-07-27: qty 1

## 2016-07-27 MED ORDER — HYDROXYUREA 500 MG PO CAPS
1500.0000 mg | ORAL_CAPSULE | ORAL | Status: DC
  Administered 2016-07-28: 1500 mg via ORAL
  Filled 2016-07-27: qty 3

## 2016-07-27 NOTE — Progress Notes (Signed)
Pt c/o increase pain, increase swelling in anterior portion of left foot. Pt has been using PCA as ordered and additional med and treatment options ordered has been ineffective. Pt pain is 9/10. SRP, RN

## 2016-07-27 NOTE — Progress Notes (Signed)
SICKLE CELL SERVICE PROGRESS NOTE  [name] [id number]:[id number] DOB: [birth year] DOA: 07/25/2016 PCP: Angelica Chessman, MD  Assessment/Plan: Principal Problem:   Sickle cell anemia with crisis ([place]) Active Problems:   Leukocytosis   Hyperbilirubinemia   History of DVT (deep vein thrombosis)   Right leg pain  1. Hb SS with crisis: Will continue PCA , Toradol and Will decrease IVF to [facility]. Pt was started on Endari yesterday but states that she was told by a nurse that it was for constipation so she refused. Pt given written information on Endari and will consider. 2. Leukocytosis: Mild leukocytosis. Likely related to crisis. Continue to monitor. 3. Anemia of chronic Disease: Hb stable. Pt is currently below the starting dose of 15 mg/kg. Will increase to alternating 1000 and 1500 mg QOD which will bring patient up to dose of 15.24 mg /kg. 4. Chronic pain: Continue MS Contin 5. Chronic Anticoagulation: Pt had recent acute DVT and is on Xarelto 20 mg daily.   Code Status: Full Code Family Communication: N/A Disposition Plan: Not yet ready for discharge  [name].  Pager [phone]. If 7PM-7AM, please contact night-coverage.  07/27/2016, 5:16 PM  LOS: 1 day  Interim History: Pt reports that pain is localized to LLE and right foot. Pain has be as high as 8/10 but is now down to 7/10 after clinician assisted dose. This is her 1st requested dose for today so will not schedule any doses. She has used 40.2 mg on the PCA  With 71/67:demands/deliveries. Pt had a BM last night.   Consultants:  None   Procedures:  None  Antibiotics:   None   Objective: Vitals:   07/27/16 0800 07/27/16 1057 07/27/16 1252 07/27/16 1548  BP:    (!) 128/95  Pulse:    88  Resp: 14 14 13 19   Temp:    98.7 F (37.1 C)  TempSrc:    Oral  SpO2: 100% 97% 98% 100%  Weight:      Height:       Weight change:   Intake/Output Summary (Last 24 hours) at 07/27/16 1716 Last data filed at  07/27/16 1548  Gross per 24 hour  Intake             2375 ml  Output             2000 ml  Net              375 ml    General: Alert, awake, oriented x3, in no acute distress.  HEENT: [place]/AT PEERL, EOMI, anicteric Neck: Trachea midline,  no masses, no thyromegal,y no JVD, no carotid bruit OROPHARYNX:  Moist, No exudate/ erythema/lesions.  Heart: Regular rate and rhythm, without murmurs, rubs, gallops, PMI non-displaced, no heaves or thrills on palpation.  Lungs: Clear to auscultation, no wheezing or rhonchi noted. No increased vocal fremitus resonant to percussion.  Abdomen: Soft, nontender, nondistended, positive bowel sounds, no masses no hepatosplenomegaly noted. Neuro: No focal neurological deficits noted cranial nerves II through XII grossly intact.  Strength at functional baseline in bilateral upper and lower extremities. Musculoskeletal: No warmth swelling or erythema around joints, no spinal tenderness noted. Psychiatric: Patient alert and oriented x3, good insight and cognition, good recent to remote recall.    Data Reviewed: Basic Metabolic Panel:  Recent Labs Lab 07/25/16 0018 07/25/16 1608 07/26/16 0929  NA 139 137 138  K 3.6 3.6 4.1  CL 108 110 111  CO2 25 22 24   GLUCOSE 109* 120*  109*  BUN 13 12 9   CREATININE 0.66 0.62 0.62  CALCIUM 9.2 8.8* 8.4*   Liver Function Tests:  Recent Labs Lab 07/25/16 0018 07/25/16 1608  AST 20 20  ALT 15 16  ALKPHOS 56 55  BILITOT 1.9* 2.3*  PROT 7.2 7.0  ALBUMIN 4.4 4.2   No results for input(s): LIPASE, AMYLASE in the last 168 hours. No results for input(s): AMMONIA in the last 168 hours. CBC:  Recent Labs Lab 07/25/16 0018 07/25/16 1608  WBC 13.9* 10.6*  NEUTROABS 8.2* 6.2  HGB 8.7* 8.3*  HCT 24.0* 23.6*  MCV 87.9 87.1  PLT 485* 464*   Cardiac Enzymes: No results for input(s): CKTOTAL, CKMB, CKMBINDEX, TROPONINI in the last 168 hours. BNP (last 3 results) No results for input(s): BNP in the last 8760  hours.  ProBNP (last 3 results) No results for input(s): PROBNP in the last 8760 hours.  CBG: No results for input(s): GLUCAP in the last 168 hours.  No results found for this or any previous visit (from the past 240 hour(s)).   Studies: Dg Neck Soft Tissue  Result Date: 06/28/2016 CLINICAL DATA:  Neck pain. EXAM: NECK SOFT TISSUES - 1+ VIEW COMPARISON:  MRI 06/13/2016.  CT 06/10/2016. FINDINGS: Loss of normal cervical lordosis. No acute bony abnormality. Soft tissue structures are unremarkable. If symptoms remain contrast-enhanced neck CT can be obtained . Prior port catheter noted. Pulmonary apices are clear. IMPRESSION: 1. Loss of normal cervical lordosis. No acute or focal bony abnormality. 2. Soft tissue structures are unremarkable. Electronically Signed   By: Marcello Moores  Register   On: 06/28/2016 14:33   Dg Knee Right Port  Result Date: 07/25/2016 CLINICAL DATA:  26 year old female with right knee pain and swelling. EXAM: PORTABLE RIGHT KNEE - 1-2 VIEW COMPARISON:  Radiograph dated 05/28/2013 FINDINGS: There is no acute fracture or dislocation. The bones are well mineralized. Areas of heterogeneity in the proximal tibia again noted as seen on the prior exam. No arthritic changes. No joint effusion. The soft tissues appear unremarkable. IMPRESSION: No acute osseous pathology.  No interval change. Electronically Signed   By: Anner Crete M.D.   On: 07/25/2016 23:45    Scheduled Meds: . HYDROmorphone   Intravenous Q4H  . hydroxyurea  1,000 mg Oral Daily  . ketorolac  30 mg Intravenous Q6H  . L-glutamine  15 g Oral BID  . oxyCODONE  15 mg Oral Q4H  . rivaroxaban  20 mg Oral Q supper  . senna-docusate  1 tablet Oral BID  . valACYclovir  1,000 mg Oral Daily   Continuous Infusions: . dextrose 5 % and 0.45% NaCl 125 mL/hr at 07/27/16 1253    Principal Problem:   Sickle cell anemia with crisis [facility]) Active Problems:   Leukocytosis   Hyperbilirubinemia   History of DVT (deep vein  thrombosis)   Right leg pain   In excess of 25 minutes spent during this visit. Greater than 50% involved face to face contact with the patient for assessment, counseling and coordination of care.

## 2016-07-27 NOTE — Care Management Note (Signed)
Case Management Note  Patient Details  Name: [name] MRN: [id number] Date of Birth: Jul 17, 1990  Subjective/Objective:   Sickle cell anemia with crisis [facility]) Active Problems:   Leukocytosis   Hyperbilirubinemia   History of DVT (deep vein thrombosis)   Right leg pain                Action/Plan: Plan to dc home with no needs at present time.   Expected Discharge Date:   08/01/16               Expected Discharge Plan:  Home/Self Care  In-House Referral:  NA  Discharge planning Services  CM Consult  Post Acute Care Choice:  NA Choice offered to:  NA  DME Arranged:  N/A DME Agency:  NA  HH Arranged:  NA HH Agency:  NA  Status of Service:  In process, will continue to follow  If discussed at Long Length of Stay Meetings, dates discussed:    Additional CommentsPurcell Mouton, RN 07/27/2016, 12:01 PM

## 2016-07-28 LAB — RETICULOCYTES
RBC.: 2.51 MIL/uL — AB (ref 3.87–5.11)
RETIC COUNT ABSOLUTE: 374 10*3/uL — AB (ref 19.0–186.0)
Retic Ct Pct: 14.9 % — ABNORMAL HIGH (ref 0.4–3.1)

## 2016-07-28 LAB — CBC WITH DIFFERENTIAL/PLATELET
Basophils Absolute: 0.1 10*3/uL (ref 0.0–0.1)
Basophils Relative: 0 %
EOS PCT: 8 %
Eosinophils Absolute: 1.2 10*3/uL — ABNORMAL HIGH (ref 0.0–0.7)
HCT: 22.3 % — ABNORMAL LOW (ref 36.0–46.0)
Hemoglobin: 7.8 g/dL — ABNORMAL LOW (ref 12.0–15.0)
LYMPHS ABS: 2.5 10*3/uL (ref 0.7–4.0)
LYMPHS PCT: 17 %
MCH: 31.1 pg (ref 26.0–34.0)
MCHC: 35 g/dL (ref 30.0–36.0)
MCV: 88.8 fL (ref 78.0–100.0)
MONO ABS: 1.4 10*3/uL — AB (ref 0.1–1.0)
Monocytes Relative: 9 %
Neutro Abs: 9.6 10*3/uL — ABNORMAL HIGH (ref 1.7–7.7)
Neutrophils Relative %: 66 %
Platelets: 432 10*3/uL — ABNORMAL HIGH (ref 150–400)
RBC: 2.51 MIL/uL — AB (ref 3.87–5.11)
RDW: 18.3 % — AB (ref 11.5–15.5)
WBC: 14.7 10*3/uL — ABNORMAL HIGH (ref 4.0–10.5)

## 2016-07-28 MED ORDER — MUSCLE RUB 10-15 % EX CREA
TOPICAL_CREAM | CUTANEOUS | Status: DC | PRN
  Administered 2016-07-28: 05:00:00 via TOPICAL
  Filled 2016-07-28: qty 85

## 2016-07-28 NOTE — Consult Note (Addendum)
   [facility] CM Inpatient Consult   07/28/2016  [name] Mar 28, 1991 [id number]    Due to consistent unsuccessful attempts in outreaching to Ms. Lamore upon previous hospital discharges, [facility] Care Management unable to follow. Discussed previously with [facility] Assistant Director. Ms. Goracke made aware of this as well. Spoke with Ms. Sippel at bedside and she also indicates her Primary Care MD is now Dr. Ricke Hey. She states she no longer goes to [facility]. Please note that Dr. Ricke Hey is not at [facility] Provider. Will make inpatient RNCM aware that [facility] unable to follow.    Marthenia Rolling, MSN-Ed, RN,BSN [facility] Liaison [phone]

## 2016-07-28 NOTE — Progress Notes (Signed)
Patient ID: [name], female   DOB: 1991-02-09, 26 y.o.   MRN: [id number] Subjective:  Patient claims she still has pain in her right knee joint, although better than yesterday, now 7/10. She denies fever, no chest pain, no abdominal pain. She is tolerating PO intake without restrictions.  Objective:  Vital signs in last 24 hours:  Vitals:   07/28/16 1013 07/28/16 1358 07/28/16 1401 07/28/16 1719  BP: 107/60  (!) 104/58   Pulse: 90  100   Resp: 15 15 15 12   Temp: 98.4 F (36.9 C)  98.6 F (37 C)   TempSrc: Oral  Oral   SpO2: 99% 97% 97% 98%  Weight:      Height:       Intake/Output from previous day:   Intake/Output Summary (Last 24 hours) at 07/28/16 1754 Last data filed at 07/28/16 1402  Gross per 24 hour  Intake          2520.83 ml  Output             4300 ml  Net         -1779.17 ml   Physical Exam: General: Alert, awake, oriented x3, in no acute distress.  HEENT: [place]/AT PEERL, EOMI Neck: Trachea midline,  no masses, no thyromegal,y no JVD, no carotid bruit OROPHARYNX:  Moist, No exudate/ erythema/lesions.  Heart: Regular rate and rhythm, without murmurs, rubs, gallops, PMI non-displaced, no heaves or thrills on palpation.  Lungs: Clear to auscultation, no wheezing or rhonchi noted. No increased vocal fremitus resonant to percussion  Abdomen: Soft, nontender, nondistended, positive bowel sounds, no masses no hepatosplenomegaly noted..  Neuro: No focal neurological deficits noted cranial nerves II through XII grossly intact. DTRs 2+ bilaterally upper and lower extremities. Strength 5 out of 5 in bilateral upper and lower extremities. Musculoskeletal: No warm swelling or erythema around joints, no spinal tenderness noted. Psychiatric: Patient alert and oriented x3, good insight and cognition, good recent to remote recall. Lymph node survey: No cervical axillary or inguinal lymphadenopathy noted.  Lab Results:  Basic Metabolic Panel:    Component Value  Date/Time   NA 138 07/26/2016 0929   NA 139 05/06/2012 1645   K 4.1 07/26/2016 0929   K 3.9 05/06/2012 1645   CL 111 07/26/2016 0929   CL 110 (H) 05/06/2012 1645   CO2 24 07/26/2016 0929   CO2 22 05/06/2012 1645   BUN 9 07/26/2016 0929   BUN 9 05/06/2012 1645   CREATININE 0.62 07/26/2016 0929   CREATININE 0.43 (L) 07/06/2015 1356   GLUCOSE 109 (H) 07/26/2016 0929   GLUCOSE 84 05/06/2012 1645   CALCIUM 8.4 (L) 07/26/2016 0929   CALCIUM 8.3 (L) 05/06/2012 1645   CBC:    Component Value Date/Time   WBC 14.7 (H) 07/28/2016 0425   HGB 7.8 (L) 07/28/2016 0425   HGB 8.7 (L) 05/11/2012 0721   HCT 22.3 (L) 07/28/2016 0425   HCT 26.9 (L) 05/11/2012 0721   PLT 432 (H) 07/28/2016 0425   PLT 497 (H) 05/11/2012 0721   MCV 88.8 07/28/2016 0425   MCV 94 05/11/2012 0721   NEUTROABS 9.6 (H) 07/28/2016 0425   NEUTROABS 10.9 (H) 05/10/2012 0552   LYMPHSABS 2.5 07/28/2016 0425   LYMPHSABS 3.5 05/10/2012 0552   MONOABS 1.4 (H) 07/28/2016 0425   MONOABS 1.7 (H) 05/10/2012 0552   EOSABS 1.2 (H) 07/28/2016 0425   EOSABS 0.8 (H) 05/10/2012 0552   BASOSABS 0.1 07/28/2016 0425   BASOSABS 0.1 05/10/2012 8676  No results found for this or any previous visit (from the past 240 hour(s)).  Studies/Results: No results found.  Medications: Scheduled Meds: . HYDROmorphone   Intravenous Q4H  . hydroxyurea  1,500 mg Oral QODAY   And  . [START ON 07/29/2016] hydroxyurea  1,000 mg Oral QODAY  . ketorolac  30 mg Intravenous Q6H  . L-glutamine  15 g Oral BID  . oxyCODONE  15 mg Oral Q4H  . rivaroxaban  20 mg Oral Q supper  . senna-docusate  1 tablet Oral BID  . valACYclovir  1,000 mg Oral Daily   Continuous Infusions: . dextrose 5 % and 0.45% NaCl 10 mL/hr at 07/28/16 0037   PRN Meds:.aspirin-acetaminophen-caffeine, diphenhydrAMINE **OR** diphenhydrAMINE (BENADRYL) IVPB(SICKLE CELL ONLY), MUSCLE RUB, naloxone **AND** sodium chloride flush, ondansetron (ZOFRAN) IV, polyethylene glycol, sodium  chloride flush  Consultants:  None  Procedures:  None  Antibiotics:  None  Assessment/Plan: Principal Problem:   Sickle cell anemia with crisis ([place]) Active Problems:   Leukocytosis   Hyperbilirubinemia   History of DVT (deep vein thrombosis)   Right leg pain  1. Hb SS with crisis: Will continue PCA and Toradol.  2. Leukocytosis: Mild leukocytosis. Likely related to crisis. Continue to monitor. 3. Anemia of chronic Disease: Hb stable. Continue hydroxyurea 4. Chronic pain: Continue MS Contin 5. Chronic Anticoagulation: Pt had recent acute DVT and is on Xarelto 20 mg daily.  Code Status: Full Code Family Communication: N/A Disposition Plan: Not yet ready for discharge  Lorita Forinash  If 7PM-7AM, please contact night-coverage.  07/28/2016, 5:54 PM  LOS: 2 days

## 2016-07-29 DIAGNOSIS — M79604 Pain in right leg: Secondary | ICD-10-CM

## 2016-07-29 MED ORDER — HEPARIN SOD (PORK) LOCK FLUSH 100 UNIT/ML IV SOLN
500.0000 [IU] | INTRAVENOUS | Status: AC | PRN
  Administered 2016-07-29: 500 [IU]

## 2016-07-29 MED ORDER — HYDROMORPHONE HCL 1 MG/ML IJ SOLN
2.0000 mg | Freq: Once | INTRAMUSCULAR | Status: AC
  Administered 2016-07-29: 2 mg via INTRAVENOUS
  Filled 2016-07-29: qty 2

## 2016-07-29 NOTE — Discharge Summary (Signed)
Physician Discharge Summary  DIAHN WAIDELICH YTK:[id number] DOB: 1991/01/17 DOA: 07/25/2016  PCP: Ricke Hey, MD  Admit date: 07/25/2016  Discharge date: 07/29/2016  Discharge Diagnoses:  Principal Problem:   Sickle cell anemia with crisis [facility]) Active Problems:   Leukocytosis   Hyperbilirubinemia   History of DVT (deep vein thrombosis)   Right leg pain   Discharge Condition: Stable  Disposition: Stable   Diet: Regular  Wt Readings from Last 3 Encounters:  07/26/16 82.3 kg (181 lb 7 oz)  07/25/16 81.6 kg (180 lb)  07/13/16 82.6 kg (182 lb)    History of present illness:  [name] is a 26 y.o. female with medical history significant of sickle cell anemia requiring blood transfusions, pain crises, DVT on Xarelto, HSV2; who presents with right knee pain since yesterday morning. Pain is described as tingling/numbness feeling that is constant and will radiate down to her foot. Rates pain as a 8 out of 10 at its worst. Pain is worse with ambulation and movement of the left knee. Tried utilizing home oral medications of oxycodone and MS Contin without relief of symptoms. Denies any recent falls/ trauma to the affected knee, chest pain, fever, chills, vomiting, shortness of breath, or cough.  This is not where her sickle cell pain normally occurs. Associated symptoms include swelling the right knee. Patient reports this is the third time being evaluated in the last 24 hours. She was first seen at the emergency department, given pain medication with relief of symptoms, and was able to be discharged home. However, the pain returned and she went to [facility] emergency department, but reports not being given any pain medications and therefore left. She then came to [facility] for further treatment. She sees Dr. Ricke Hey and Dr. Zigmund Daniel for her sickle cell care.        ED Course: On admission his emergency department patient was seen to be afebrile, pulse from 91-121,  and other vitals within normal limits. Lab work revealed WBC 10.6, hemoglobin 8.3, platelets 464, total bilirubin 2.3, and all other labs relatively within normal limits. Patient was given 4 doses of 2 mg of Dilaudid while in the ED. Transfer to [facility] to telemetry bed.  Hospital Course:  Patient was admitted for sickle cell anemia with pain crisis/right knee pain, she was admitted to telemetry unit and managed with the standard sickle cell pain management protocol. R Knee Xray was unremarkable, no effusion. She had mild leukocytosis, likely reflection of crisis, no evidence of infection. Hemoglobin was stable, she was maintained on her hydroxyurea. She improved significantly, able to ambulate without significant pain, right knee swelling and pain resolved. Patient remained hemodynamically stable and she was discharged home in stable condition to follow up with her PCP within one week of this discharge.   Discharge Exam: Vitals:   07/29/16 0530 07/29/16 0926  BP: 131/64   Pulse: 100   Resp: 18 12  Temp: 98 F (36.7 C)    Vitals:   07/29/16 0023 07/29/16 0356 07/29/16 0530 07/29/16 0926  BP:   131/64   Pulse:   100   Resp: 18 18 18 12   Temp:   98 F (36.7 C)   TempSrc:   Oral   SpO2: 97% 98% 100% 98%  Weight:      Height:        General appearance : Awake, alert, not in any distress. Speech Clear. Not toxic looking HEENT: Atraumatic and Normocephalic, pupils equally reactive to  light and accomodation Neck: Supple, no JVD. No cervical lymphadenopathy.  Chest: Good air entry bilaterally, no added sounds  CVS: S1 S2 regular, no murmurs.  Abdomen: Bowel sounds present, Non tender and not distended with no gaurding, rigidity or rebound. Extremities: B/L Lower Ext shows no edema, both legs are warm to touch Neurology: Awake alert, and oriented X 3, CN II-XII intact, Non focal Skin: No Rash  Discharge Instructions  Discharge Instructions    Diet - low sodium heart healthy     Complete by:  As directed    Increase activity slowly    Complete by:  As directed      Allergies as of 07/29/2016      Reactions   Food Hives, Other (See Comments)   Pt states that she is allergic to carrots.    Latex Rash      Medication List    TAKE these medications   aspirin-acetaminophen-caffeine 250-250-65 MG tablet Commonly known as:  EXCEDRIN MIGRAINE Take 2 tablets by mouth every 6 (six) hours as needed for headache.   BAYER HEART HEALTH ADVANTAGE PO Take 2 tablets by mouth daily.   BIOTIN PO Take 2 tablets by mouth daily.   [employer] DAILY ENERGY Tabs Take 2 tablets by mouth daily.   hydroxyurea 500 MG capsule Commonly known as:  HYDREA Take 2 capsules (1,000 mg total) by mouth daily. May take with food to minimize GI side effects.   morphine 60 MG 12 hr tablet Commonly known as:  MS CONTIN Take 60 mg by mouth every 12 (twelve) hours.   oxyCODONE 15 MG immediate release tablet Commonly known as:  ROXICODONE Take 15 mg by mouth every 4 (four) hours as needed for pain.   rivaroxaban 20 MG Tabs tablet Commonly known as:  XARELTO Take 20 mg by mouth daily.   valACYclovir 1000 MG tablet Commonly known as:  VALTREX Take 1 tablet (1,000 mg total) by mouth daily.        The results of significant diagnostics from this hospitalization (including imaging, microbiology, ancillary and laboratory) are listed below for reference.    Significant Diagnostic Studies: Dg Knee Right Port  Result Date: 07/25/2016 CLINICAL DATA:  26 year old female with right knee pain and swelling. EXAM: PORTABLE RIGHT KNEE - 1-2 VIEW COMPARISON:  Radiograph dated 05/28/2013 FINDINGS: There is no acute fracture or dislocation. The bones are well mineralized. Areas of heterogeneity in the proximal tibia again noted as seen on the prior exam. No arthritic changes. No joint effusion. The soft tissues appear unremarkable. IMPRESSION: No acute osseous pathology.  No interval change.  Electronically Signed   By: Anner Crete M.D.   On: 07/25/2016 23:45    Microbiology: No results found for this or any previous visit (from the past 240 hour(s)).   Labs: Basic Metabolic Panel:  Recent Labs Lab 07/25/16 0018 07/25/16 1608 07/26/16 0929  NA 139 137 138  K 3.6 3.6 4.1  CL 108 110 111  CO2 25 22 24   GLUCOSE 109* 120* 109*  BUN 13 12 9   CREATININE 0.66 0.62 0.62  CALCIUM 9.2 8.8* 8.4*   Liver Function Tests:  Recent Labs Lab 07/25/16 0018 07/25/16 1608  AST 20 20  ALT 15 16  ALKPHOS 56 55  BILITOT 1.9* 2.3*  PROT 7.2 7.0  ALBUMIN 4.4 4.2   No results for input(s): LIPASE, AMYLASE in the last 168 hours. No results for input(s): AMMONIA in the last 168 hours. CBC:  Recent Labs Lab 07/25/16  0018 07/25/16 1608 07/28/16 0425  WBC 13.9* 10.6* 14.7*  NEUTROABS 8.2* 6.2 9.6*  HGB 8.7* 8.3* 7.8*  HCT 24.0* 23.6* 22.3*  MCV 87.9 87.1 88.8  PLT 485* 464* 432*   Cardiac Enzymes: No results for input(s): CKTOTAL, CKMB, CKMBINDEX, TROPONINI in the last 168 hours. BNP: Invalid input(s): POCBNP CBG: No results for input(s): GLUCAP in the last 168 hours.  Time coordinating discharge: 50 minutes  Signed:  Arius Harnois, [place] [facility] 07/29/2016, 11:40 AM

## 2016-08-10 ENCOUNTER — Encounter (HOSPITAL_COMMUNITY): Payer: Self-pay | Admitting: Emergency Medicine

## 2016-08-10 ENCOUNTER — Emergency Department (HOSPITAL_COMMUNITY)
Admission: EM | Admit: 2016-08-10 | Discharge: 2016-08-10 | Disposition: A | Discharge status: Home / Self Care | Payer: Medicare Other | Attending: Emergency Medicine | Admitting: Emergency Medicine

## 2016-08-10 DIAGNOSIS — Z87891 Personal history of nicotine dependence: Secondary | ICD-10-CM | POA: Diagnosis not present

## 2016-08-10 DIAGNOSIS — D5700 Hb-SS disease with crisis, unspecified: Secondary | ICD-10-CM | POA: Diagnosis not present

## 2016-08-10 DIAGNOSIS — Z79899 Other long term (current) drug therapy: Secondary | ICD-10-CM | POA: Diagnosis not present

## 2016-08-10 DIAGNOSIS — Z91040 Latex allergy status: Secondary | ICD-10-CM | POA: Diagnosis not present

## 2016-08-10 DIAGNOSIS — Z7901 Long term (current) use of anticoagulants: Secondary | ICD-10-CM | POA: Insufficient documentation

## 2016-08-10 DIAGNOSIS — Z7982 Long term (current) use of aspirin: Secondary | ICD-10-CM | POA: Insufficient documentation

## 2016-08-10 LAB — RETICULOCYTES
RBC.: 2.79 MIL/uL — ABNORMAL LOW (ref 3.87–5.11)
Retic Count, Absolute: 267.8 10*3/uL — ABNORMAL HIGH (ref 19.0–186.0)
Retic Ct Pct: 9.6 % — ABNORMAL HIGH (ref 0.4–3.1)

## 2016-08-10 LAB — CBC WITH DIFFERENTIAL/PLATELET
Basophils Absolute: 0.1 10*3/uL (ref 0.0–0.1)
Basophils Relative: 1 %
Eosinophils Absolute: 0.8 10*3/uL — ABNORMAL HIGH (ref 0.0–0.7)
Eosinophils Relative: 8 %
HCT: 24.3 % — ABNORMAL LOW (ref 36.0–46.0)
HEMOGLOBIN: 8.7 g/dL — AB (ref 12.0–15.0)
LYMPHS PCT: 28 %
Lymphs Abs: 2.8 10*3/uL (ref 0.7–4.0)
MCH: 31.2 pg (ref 26.0–34.0)
MCHC: 35.8 g/dL (ref 30.0–36.0)
MCV: 87.1 fL (ref 78.0–100.0)
Monocytes Absolute: 1.1 10*3/uL — ABNORMAL HIGH (ref 0.1–1.0)
Monocytes Relative: 11 %
NEUTROS PCT: 52 %
Neutro Abs: 5.4 10*3/uL (ref 1.7–7.7)
Platelets: 490 10*3/uL — ABNORMAL HIGH (ref 150–400)
RBC: 2.79 MIL/uL — AB (ref 3.87–5.11)
RDW: 16.7 % — ABNORMAL HIGH (ref 11.5–15.5)
WBC: 10.2 10*3/uL (ref 4.0–10.5)

## 2016-08-10 LAB — COMPREHENSIVE METABOLIC PANEL
ALT: 14 U/L (ref 14–54)
AST: 19 U/L (ref 15–41)
Albumin: 4.2 g/dL (ref 3.5–5.0)
Alkaline Phosphatase: 55 U/L (ref 38–126)
Anion gap: 4 — ABNORMAL LOW (ref 5–15)
BUN: 13 mg/dL (ref 6–20)
CO2: 25 mmol/L (ref 22–32)
Calcium: 8.7 mg/dL — ABNORMAL LOW (ref 8.9–10.3)
Chloride: 108 mmol/L (ref 101–111)
Creatinine, Ser: 0.66 mg/dL (ref 0.44–1.00)
Glucose, Bld: 95 mg/dL (ref 65–99)
Potassium: 3.7 mmol/L (ref 3.5–5.1)
Sodium: 137 mmol/L (ref 135–145)
Total Bilirubin: 2.4 mg/dL — ABNORMAL HIGH (ref 0.3–1.2)
Total Protein: 7 g/dL (ref 6.5–8.1)

## 2016-08-10 LAB — LACTATE DEHYDROGENASE: LDH: 226 U/L — AB (ref 98–192)

## 2016-08-10 MED ORDER — HEPARIN SOD (PORK) LOCK FLUSH 100 UNIT/ML IV SOLN
500.0000 [IU] | Freq: Once | INTRAVENOUS | Status: AC
  Administered 2016-08-10: 500 [IU]
  Filled 2016-08-10: qty 5

## 2016-08-10 MED ORDER — HYDROMORPHONE HCL 1 MG/ML IJ SOLN
2.0000 mg | Freq: Once | INTRAMUSCULAR | Status: AC
  Administered 2016-08-10: 2 mg via INTRAVENOUS
  Filled 2016-08-10: qty 2

## 2016-08-10 MED ORDER — DIPHENHYDRAMINE HCL 25 MG PO CAPS
25.0000 mg | ORAL_CAPSULE | Freq: Once | ORAL | Status: AC
  Administered 2016-08-10: 25 mg via ORAL
  Filled 2016-08-10: qty 1

## 2016-08-10 MED ORDER — SODIUM CHLORIDE 0.9 % IV BOLUS (SEPSIS)
1000.0000 mL | Freq: Once | INTRAVENOUS | Status: AC
  Administered 2016-08-10: 1000 mL via INTRAVENOUS

## 2016-08-10 MED ORDER — ONDANSETRON HCL 4 MG/2ML IJ SOLN
4.0000 mg | Freq: Once | INTRAMUSCULAR | Status: AC
  Administered 2016-08-10: 4 mg via INTRAVENOUS
  Filled 2016-08-10: qty 2

## 2016-08-10 MED ORDER — KETOROLAC TROMETHAMINE 30 MG/ML IJ SOLN
30.0000 mg | Freq: Once | INTRAMUSCULAR | Status: AC
  Administered 2016-08-10: 30 mg via INTRAVENOUS
  Filled 2016-08-10: qty 1

## 2016-08-10 MED ORDER — PROMETHAZINE HCL 25 MG/ML IJ SOLN
12.5000 mg | Freq: Once | INTRAMUSCULAR | Status: AC
  Administered 2016-08-10: 12.5 mg via INTRAVENOUS
  Filled 2016-08-10: qty 1

## 2016-08-10 MED ORDER — DIPHENHYDRAMINE HCL 25 MG PO CAPS
50.0000 mg | ORAL_CAPSULE | Freq: Once | ORAL | Status: AC
  Administered 2016-08-10: 50 mg via ORAL
  Filled 2016-08-10: qty 2

## 2016-08-10 MED ORDER — HYDROMORPHONE HCL 1 MG/ML IJ SOLN
1.0000 mg | Freq: Once | INTRAMUSCULAR | Status: AC
  Administered 2016-08-10: 1 mg via INTRAVENOUS
  Filled 2016-08-10: qty 1

## 2016-08-10 NOTE — Discharge Instructions (Addendum)
As discussed, please follow up with your sickle cell Provider for long term pain management.  Return to the Emergency department if you experience swelling, redness, warmth, fever/chills, chest pain, shortness of breath, weakness or any other new concerning symptoms.

## 2016-08-10 NOTE — ED Notes (Signed)
Report given to ER RN

## 2016-08-10 NOTE — ED Notes (Signed)
ED Provider at bedside. 

## 2016-08-10 NOTE — ED Provider Notes (Signed)
[facility] DEPT Provider Note   CSN: [id number] Arrival date & time: 08/10/16  0510     History   Chief Complaint Chief Complaint  Patient presents with  . Sickle Cell Pain Crisis    HPI [name] is a 26 y.o. female presenting with sudden onset sharp stabbing sickle cell pain in her right knee. She states that she had some pain before taking her opioids before when she went to bed at 10 PM last night. She woke up in worse pain at 4 AM and drove herself to the emergency department. She states that it hurts to put weight on her right knee and she has a limp. She denies chest pain, shortness of breath, fever, chills, erythema, nausea/vomiting or any other pain.  HPI  Past Medical History:  Diagnosis Date  . Blood transfusion    "lots"  . Blood transfusion without reported diagnosis   . Chronic back pain    "very severe; have knot in my back; from tight muscle; take RX and exercise for it"  . Depression 01/06/2011  . Exertional dyspnea    "sometimes"  . Genital HSV   . GERD (gastroesophageal reflux disease) 02/17/2011  . Migraines 11/08/11   "@ least twice/month"  . 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  . Mood swings ([place]) 11/08/11   "I go back and forth; real bad"  . Sickle cell anemia ([name])   . Sickle cell anemia with crisis ([place])   . Trichotillomania    h/o    Patient Active Problem List   Diagnosis Date Noted  . Sickle cell anemia with crisis ([place]) 07/25/2016  . Right leg pain 07/25/2016  . Sickle cell crisis ([place]) 06/26/2016  . Infiltrate of lung present on imaging of chest 06/26/2016  . Chronic anticoagulation   . Sickle cell anemia ([place]) 05/19/2016  . History of DVT (deep vein thrombosis) 04/17/2016  . Post partum depression 04/03/2016  . Episode of recurrent major depressive disorder ([place])   . Sickle cell anemia with pain ([place]) 02/16/2016  . Thrombocytosis ([place]) 11/09/2015  . Hyperbilirubinemia 11/09/2015  . Narcotic dependence  ([place]) 10/06/2015  . 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 High risk for intrapartum complications, antepartum 08/12/2015  . Chronic pain 08/04/2015  . Cocaine use 07/24/2015  . Herpes simplex 07/14/2015  . Anemia of chronic disease   . Leukocytosis   . 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 Sickle cell pain crisis ([place]) 03/29/2012  . 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 Sickle cell disease ([place]) 01/08/2009  . 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 CHOLECYSTECTOMY  05/2010  . DILATION AND CURETTAGE OF UTERUS  02/20/11   S/P miscarriage  . IR GENERIC HISTORICAL  12/23/2015   IR FLUORO GUIDE CV LINE RIGHT 12/23/2015 Jacqulynn Cadet, MD [facility]  . IR GENERIC HISTORICAL  12/23/2015   IR US GUIDE VASC ACCESS RIGHT 12/23/2015 Jacqulynn Cadet, MD [facility]    OB History    Gravida Para Term Preterm AB Living   3 2 2   1 2    SAB TAB Ectopic Multiple Live Births   1     0 2      Obstetric Comments   Miscarried in October 2012 at about 7 weeks       Home Medications    Prior to Admission medications   Medication Sig Start Date End Date Taking? Authorizing Provider  aspirin-acetaminophen-caffeine (EXCEDRIN MIGRAINE)  250-250-65 MG tablet Take 2 tablets by mouth every 6 (six) hours as needed for headache.     Historical Provider, MD  BIOTIN PO Take 2 tablets by mouth daily.    Historical Provider, MD  hydroxyurea (HYDREA) 500 MG capsule Take 2 capsules (1,000 mg total) by mouth daily. May take with food to minimize GI side effects. 04/18/16   Tresa Garter, MD  morphine (MS CONTIN) 60 MG 12 hr tablet Take 60 mg by mouth every 12 (twelve) hours.  07/05/16   Historical Provider, MD  Multiple Vitamins-Minerals ([employer] DAILY ENERGY) TABS Take 2 tablets by mouth daily.    Historical Provider, MD  oxyCODONE (ROXICODONE) 15 MG immediate release tablet Take 15 mg by mouth every 4 (four) hours as needed for pain.   06/18/16   Historical Provider, MD  Phytosterols ([employer] ADVANTAGE PO) Take 2 tablets by mouth daily.    Historical Provider, MD  rivaroxaban (XARELTO) 20 MG TABS tablet Take 20 mg by mouth daily.     Historical Provider, MD  valACYclovir (VALTREX) 1000 MG tablet Take 1 tablet (1,000 mg total) by mouth daily. 05/02/16   [name], MD    Family History Family History  Problem Relation Age of Onset  . Sickle cell trait Mother   . Sickle cell trait Father   . Diabetes Maternal Grandmother   . Diabetes Paternal Grandmother   . Hypertension Paternal Grandmother   . Diabetes Maternal Grandfather     Social History Social History  Substance Use Topics  . Smoking status: Former Smoker    Packs/day: 0.25    Years: 1.00    Types: Cigarettes    Quit date: 03/25/2013  . Smokeless tobacco: Never Used  . Alcohol use Yes     Comment: Twice a month     Allergies   Food and Latex   Review of Systems Review of Systems  Constitutional: Negative for chills and fever.  HENT: Negative for ear pain and sore throat.   Eyes: Negative for pain and visual disturbance.  Respiratory: Negative for cough, choking, chest tightness, shortness of breath, wheezing and stridor.   Cardiovascular: Negative for chest pain, palpitations and leg swelling.  Gastrointestinal: Negative for abdominal distention, abdominal pain, nausea and vomiting.  Genitourinary: Negative for dysuria and hematuria.  Musculoskeletal: Positive for arthralgias and gait problem. Negative for back pain, myalgias, neck pain and neck stiffness.  Skin: Negative for color change, pallor and rash.  Neurological: Positive for headaches. Negative for dizziness, seizures, syncope and weakness.     Physical Exam Updated Vital Signs BP 114/82 (BP Location: Right Arm)   Pulse 84   Temp 98 F (36.7 C) (Oral)   Resp 16   Ht 5\' 1"  (1.549 m)   Wt 79.4 kg   SpO2 97%   BMI 33.07 kg/m   Physical Exam    Constitutional: She appears well-developed and well-nourished. No distress.  Patient is afebrile, nontoxic-appearing, sitting in bed in apparent discomfort. Patient is labile.  HENT:  Head: Normocephalic and atraumatic.  Eyes: Conjunctivae and EOM are normal.  Neck: Normal range of motion. Neck supple.  Cardiovascular: Normal rate, regular rhythm, normal heart sounds and intact distal pulses.   No murmur heard. Pulmonary/Chest: Effort normal and breath sounds normal. No respiratory distress. She has no wheezes. She has no rales. She exhibits no tenderness.  Abdominal: Soft. She exhibits no distension. There is no tenderness.  Musculoskeletal: She exhibits tenderness. She exhibits no edema or  deformity.  Strong dorsalis pedis pulses distally. Reduced rom of right  knee due to pain. No erythema or warmth  Neurological: She is alert.  Skin: Skin is warm and dry. Capillary refill takes less than 2 seconds. No rash noted. She is not diaphoretic. No erythema. No pallor.  Psychiatric: She has a normal mood and affect.  Nursing note and vitals reviewed.    ED Treatments / Results  Labs (all labs ordered are listed, but only abnormal results are displayed) Labs Reviewed  COMPREHENSIVE METABOLIC PANEL - Abnormal; Notable for the following:       Result Value   Calcium 8.7 (*)    Total Bilirubin 2.4 (*)    Anion gap 4 (*)    All other components within normal limits  CBC WITH DIFFERENTIAL/PLATELET - Abnormal; Notable for the following:    RBC 2.79 (*)    Hemoglobin 8.7 (*)    HCT 24.3 (*)    RDW 16.7 (*)    Platelets 490 (*)    Monocytes Absolute 1.1 (*)    Eosinophils Absolute 0.8 (*)    All other components within normal limits  RETICULOCYTES - Abnormal; Notable for the following:    Retic Ct Pct 9.6 (*)    RBC. 2.79 (*)    Retic Count, Manual 267.8 (*)    All other components within normal limits  LACTATE DEHYDROGENASE - Abnormal; Notable for the following:    LDH 226 (*)     All other components within normal limits   Hemoglobin  Date Value Ref Range Status  08/10/2016 8.7 (L) 12.0 - 15.0 g/dL Final  07/28/2016 7.8 (L) 12.0 - 15.0 g/dL Final  07/25/2016 8.3 (L) 12.0 - 15.0 g/dL Final  07/25/2016 8.7 (L) 12.0 - 15.0 g/dL Final   HGB  Date Value Ref Range Status  05/11/2012 8.7 (L) 12.0 - 16.0 g/dL Final  05/10/2012 8.8 (L) 12.0 - 16.0 g/dL Final  05/09/2012 8.2 (L) 12.0 - 16.0 g/dL Final  05/08/2012 7.9 (L) 12.0 - 16.0 g/dL Final     EKG  EKG Interpretation None       Radiology No results found.  Procedures Procedures (including critical care time)  Medications Ordered in ED Medications  ketorolac (TORADOL) 30 MG/ML injection 30 mg (not administered)  HYDROmorphone (DILAUDID) injection 2 mg (not administered)  promethazine (PHENERGAN) injection 12.5 mg (not administered)  diphenhydrAMINE (BENADRYL) capsule 50 mg (50 mg Oral Given 08/10/16 0645)  HYDROmorphone (DILAUDID) injection 1 mg (1 mg Intravenous Given 08/10/16 0646)  sodium chloride 0.9 % bolus 1,000 mL (1,000 mLs Intravenous New Bag/Given 08/10/16 0646)  ondansetron (ZOFRAN) injection 4 mg (4 mg Intravenous Given 08/10/16 0646)     Initial Impression / Assessment and Plan / ED Course  I have reviewed the triage vital signs and the nursing notes.  Pertinent labs & imaging results that were available during my care of the patient were reviewed by me and considered in my medical decision making (see chart for details).     Patient presents with sudden onset sickle cell sharp stabbing rt knee pain. She was recently admitted for same. She is non-toxic appearing, afebrile and with stable vital signs.  Given IV fluids, pain was managed well in the emergency department On reassessment after initial pain managment, patient reported improvement in pain from > 10/10 to now 8/10. On second reassessment, she reports some improvement in pain but still experiencing moderate pain. After third  dose of analgesic, she had significantly improved and  was able to walk and stand on her knee with only mild discomfort. She was ready to go home and has communicate to me that she was not going to stay if she had to be admitted initially.   Her exam is reassuring, labs are at baseline for her, she does not have any chest pain, sob, fever, erythema or warmth at the knee or signs of infection. She is well appearing, afebrile and non-toxic.  Will discharge home with close follow up with pcp. Discussed strict return precautions and advised to return to the emergency department if experiencing any new or worsening symptoms. Instructions were understood and patient agreed with discharge plan.   Final Clinical Impressions(s) / ED Diagnoses   Final diagnoses:  Sickle cell pain crisis [facility])    New Prescriptions New Prescriptions   No medications on file     Emeline General, Hershal Coria 08/10/16 [place], MD 08/11/16 [phone]

## 2016-08-10 NOTE — ED Triage Notes (Signed)
Pt reports sickle cell pain with primary location in right knee. Pt reports being admitted to hospital several weeks ago for same. Pt reports pain started at appx 2200 last night.

## 2016-08-15 DIAGNOSIS — Z79891 Long term (current) use of opiate analgesic: Secondary | ICD-10-CM | POA: Diagnosis not present

## 2016-08-20 ENCOUNTER — Inpatient Hospital Stay (HOSPITAL_COMMUNITY)
Admission: EM | Admit: 2016-08-20 | Discharge: 2016-08-30 | DRG: 812 | Disposition: A | Discharge status: Home / Self Care | Payer: Medicare Other | Attending: Internal Medicine | Admitting: Internal Medicine

## 2016-08-20 ENCOUNTER — Encounter (HOSPITAL_COMMUNITY): Payer: Self-pay | Admitting: *Deleted

## 2016-08-20 DIAGNOSIS — Z87891 Personal history of nicotine dependence: Secondary | ICD-10-CM | POA: Diagnosis not present

## 2016-08-20 DIAGNOSIS — E860 Dehydration: Secondary | ICD-10-CM

## 2016-08-20 DIAGNOSIS — Z8249 Family history of ischemic heart disease and other diseases of the circulatory system: Secondary | ICD-10-CM

## 2016-08-20 DIAGNOSIS — Z79899 Other long term (current) drug therapy: Secondary | ICD-10-CM

## 2016-08-20 DIAGNOSIS — Z91040 Latex allergy status: Secondary | ICD-10-CM

## 2016-08-20 DIAGNOSIS — K529 Noninfective gastroenteritis and colitis, unspecified: Secondary | ICD-10-CM | POA: Diagnosis present

## 2016-08-20 DIAGNOSIS — K219 Gastro-esophageal reflux disease without esophagitis: Secondary | ICD-10-CM | POA: Diagnosis present

## 2016-08-20 DIAGNOSIS — D72829 Elevated white blood cell count, unspecified: Secondary | ICD-10-CM | POA: Diagnosis present

## 2016-08-20 DIAGNOSIS — Z79891 Long term (current) use of opiate analgesic: Secondary | ICD-10-CM | POA: Diagnosis not present

## 2016-08-20 DIAGNOSIS — Z7901 Long term (current) use of anticoagulants: Secondary | ICD-10-CM | POA: Diagnosis not present

## 2016-08-20 DIAGNOSIS — Z86718 Personal history of other venous thrombosis and embolism: Secondary | ICD-10-CM | POA: Diagnosis not present

## 2016-08-20 DIAGNOSIS — Z833 Family history of diabetes mellitus: Secondary | ICD-10-CM | POA: Diagnosis not present

## 2016-08-20 DIAGNOSIS — E876 Hypokalemia: Secondary | ICD-10-CM | POA: Diagnosis present

## 2016-08-20 DIAGNOSIS — R000 Tachycardia, unspecified: Secondary | ICD-10-CM | POA: Diagnosis not present

## 2016-08-20 DIAGNOSIS — D638 Anemia in other chronic diseases classified elsewhere: Secondary | ICD-10-CM | POA: Diagnosis not present

## 2016-08-20 DIAGNOSIS — D5700 Hb-SS disease with crisis, unspecified: Secondary | ICD-10-CM | POA: Diagnosis present

## 2016-08-20 DIAGNOSIS — N179 Acute kidney failure, unspecified: Secondary | ICD-10-CM | POA: Diagnosis present

## 2016-08-20 DIAGNOSIS — F329 Major depressive disorder, single episode, unspecified: Secondary | ICD-10-CM | POA: Diagnosis present

## 2016-08-20 DIAGNOSIS — A6000 Herpesviral infection of urogenital system, unspecified: Secondary | ICD-10-CM | POA: Diagnosis present

## 2016-08-20 DIAGNOSIS — G894 Chronic pain syndrome: Secondary | ICD-10-CM | POA: Diagnosis not present

## 2016-08-20 DIAGNOSIS — R112 Nausea with vomiting, unspecified: Secondary | ICD-10-CM

## 2016-08-20 DIAGNOSIS — R197 Diarrhea, unspecified: Secondary | ICD-10-CM

## 2016-08-20 LAB — RETICULOCYTES
RBC.: 3.31 MIL/uL — ABNORMAL LOW (ref 3.87–5.11)
RETIC COUNT ABSOLUTE: 456.8 10*3/uL — AB (ref 19.0–186.0)
RETIC CT PCT: 13.8 % — AB (ref 0.4–3.1)

## 2016-08-20 LAB — CBC WITH DIFFERENTIAL/PLATELET
BASOS ABS: 0 10*3/uL (ref 0.0–0.1)
Basophils Relative: 0 %
EOS ABS: 0.2 10*3/uL (ref 0.0–0.7)
EOS PCT: 1 %
HCT: 29.1 % — ABNORMAL LOW (ref 36.0–46.0)
Hemoglobin: 10.3 g/dL — ABNORMAL LOW (ref 12.0–15.0)
LYMPHS PCT: 3 %
Lymphs Abs: 0.7 10*3/uL (ref 0.7–4.0)
MCH: 31.1 pg (ref 26.0–34.0)
MCHC: 35.4 g/dL (ref 30.0–36.0)
MCV: 87.9 fL (ref 78.0–100.0)
MONO ABS: 0.8 10*3/uL (ref 0.1–1.0)
Monocytes Relative: 4 %
Neutro Abs: 17.3 10*3/uL — ABNORMAL HIGH (ref 1.7–7.7)
Neutrophils Relative %: 92 %
PLATELETS: 505 10*3/uL — AB (ref 150–400)
RBC: 3.31 MIL/uL — ABNORMAL LOW (ref 3.87–5.11)
RDW: 17.6 % — ABNORMAL HIGH (ref 11.5–15.5)
WBC: 18.9 10*3/uL — ABNORMAL HIGH (ref 4.0–10.5)

## 2016-08-20 LAB — COMPREHENSIVE METABOLIC PANEL
ALBUMIN: 4.5 g/dL (ref 3.5–5.0)
ALT: 46 U/L (ref 14–54)
AST: 47 U/L — ABNORMAL HIGH (ref 15–41)
Alkaline Phosphatase: 65 U/L (ref 38–126)
Anion gap: 6 (ref 5–15)
BILIRUBIN TOTAL: 2.7 mg/dL — AB (ref 0.3–1.2)
BUN: 9 mg/dL (ref 6–20)
CO2: 24 mmol/L (ref 22–32)
Calcium: 9 mg/dL (ref 8.9–10.3)
Chloride: 109 mmol/L (ref 101–111)
Creatinine, Ser: 0.48 mg/dL (ref 0.44–1.00)
GFR calc Af Amer: >60 mL/min (ref >60)
GFR calc non Af Amer: >60 mL/min (ref >60)
GLUCOSE: 128 mg/dL — AB (ref 65–99)
Potassium: 3.7 mmol/L (ref 3.5–5.1)
Sodium: 139 mmol/L (ref 135–145)
TOTAL PROTEIN: 7.7 g/dL (ref 6.5–8.1)

## 2016-08-20 LAB — LIPASE, BLOOD: LIPASE: 27 U/L (ref 11–51)

## 2016-08-20 IMAGING — US US MFM FETAL BPP W/O NON-STRESS
1 series · 13 of 24 positions shown · non-contrast
Comparison: none

[Series 1: us mfm fetal bpp w/o non-stress · 24 acquisitions, 13 frames shown]
[im 1/24]
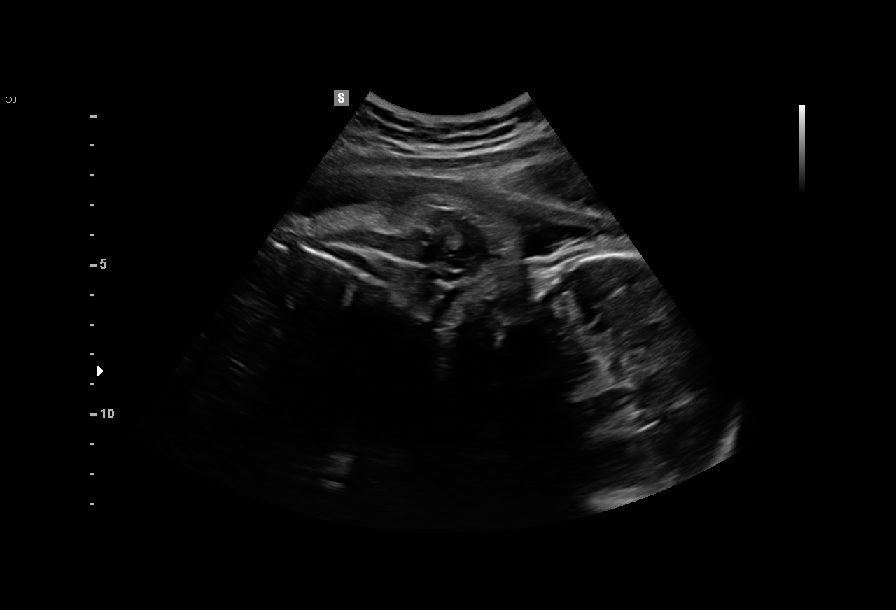
[im 3/24]
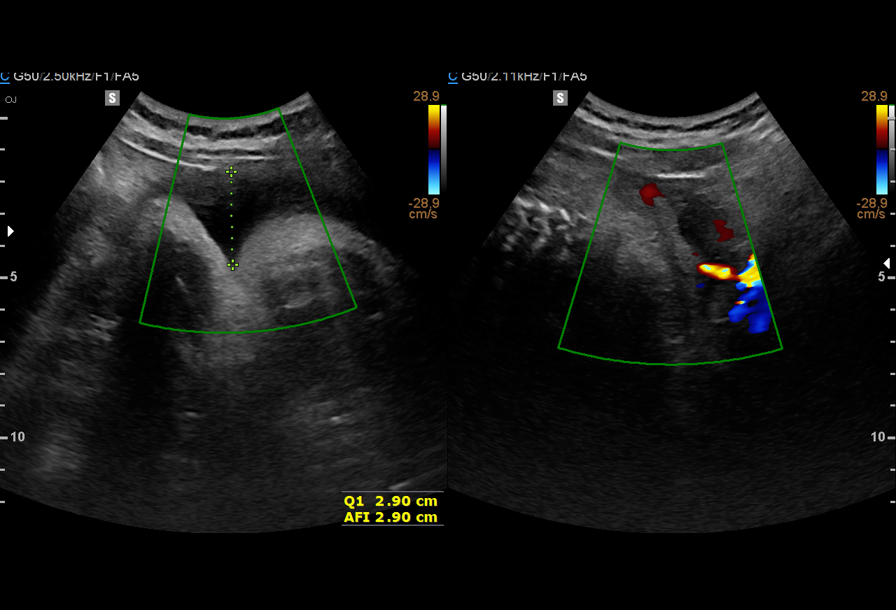
[im 5/24]
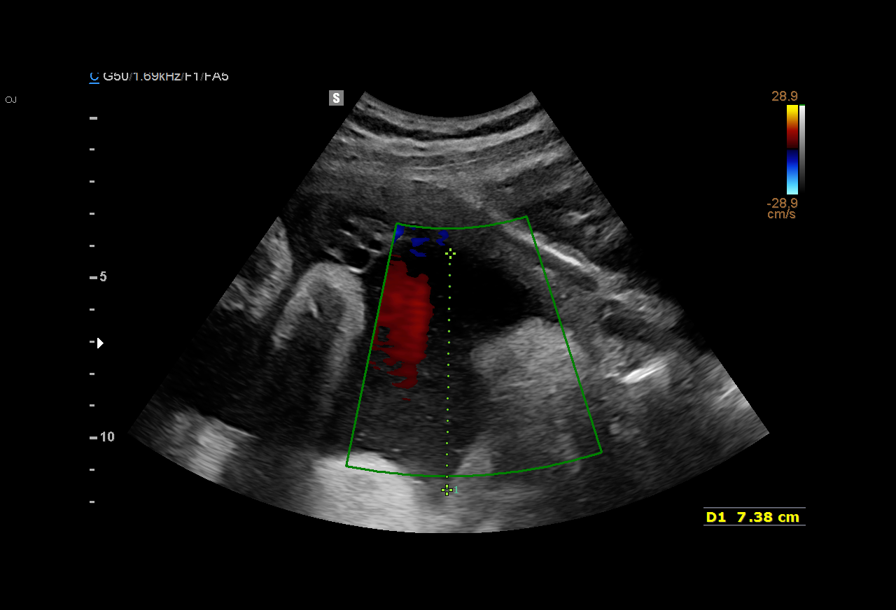
[im 7/24]
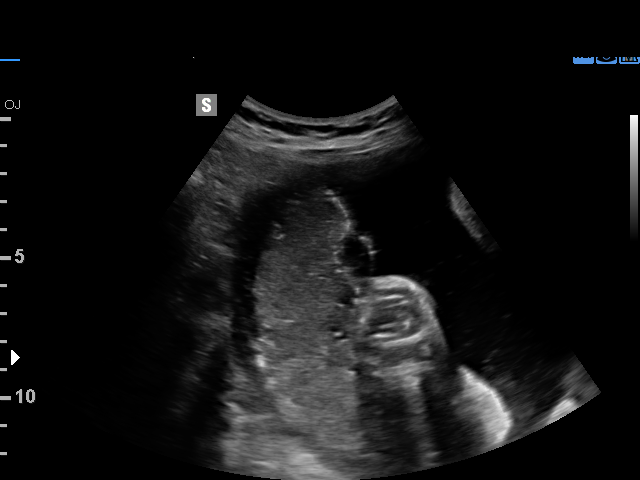
[im 9/24]
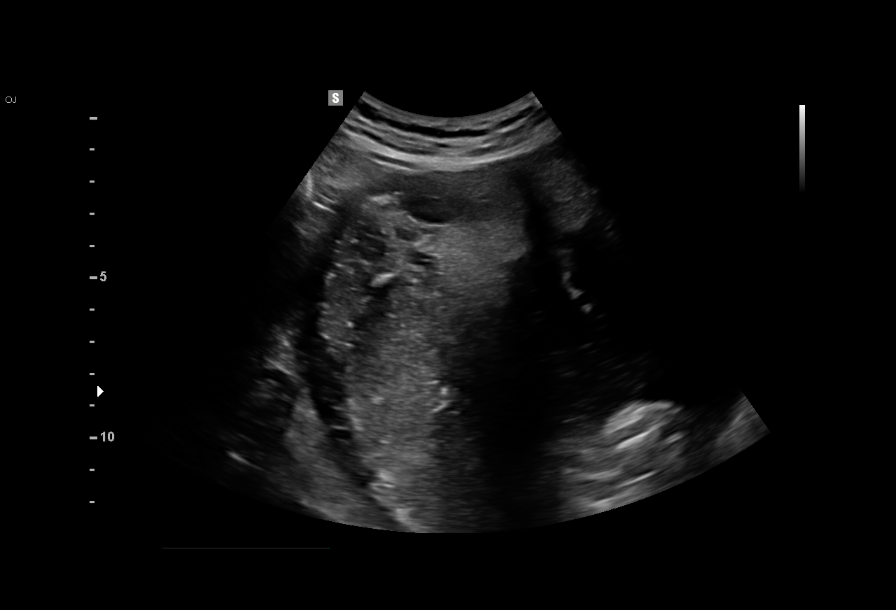
[im 11/24]
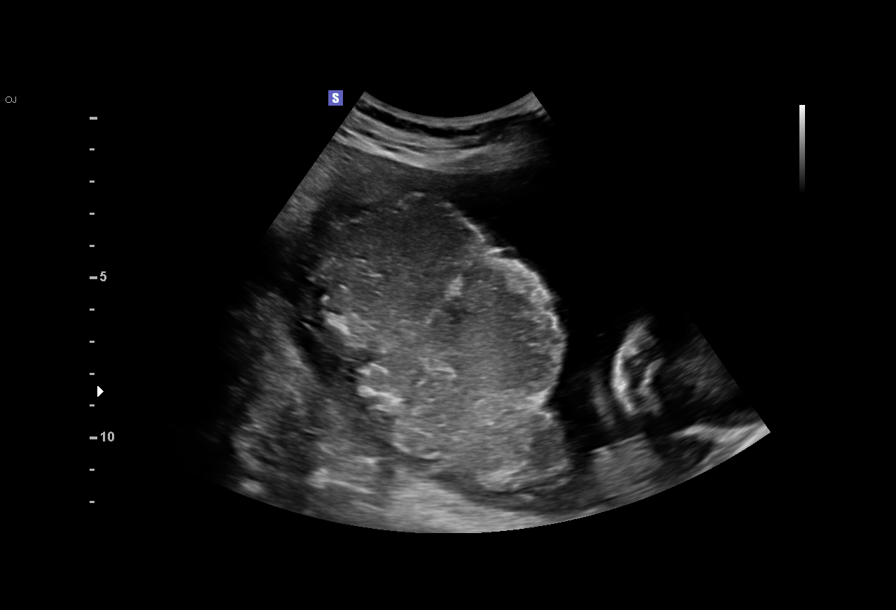
[im 13/24]
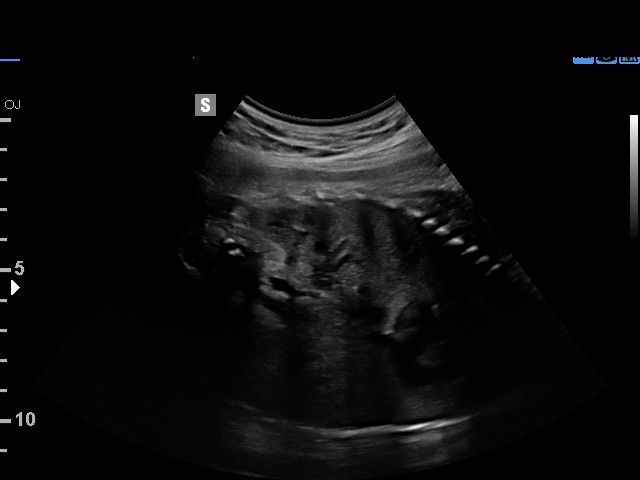
[im 14/24]
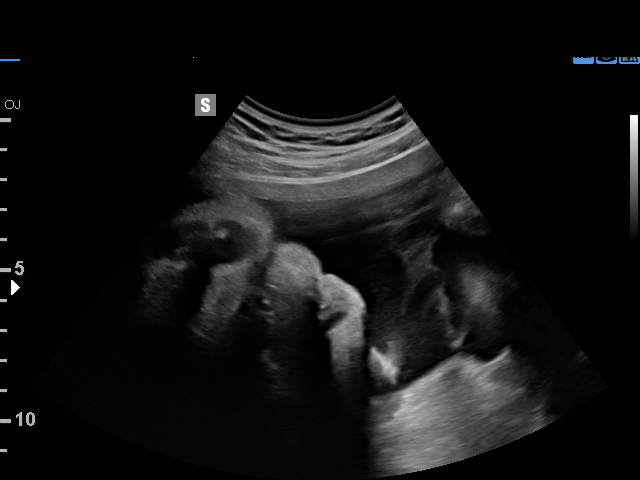
[im 16/24]
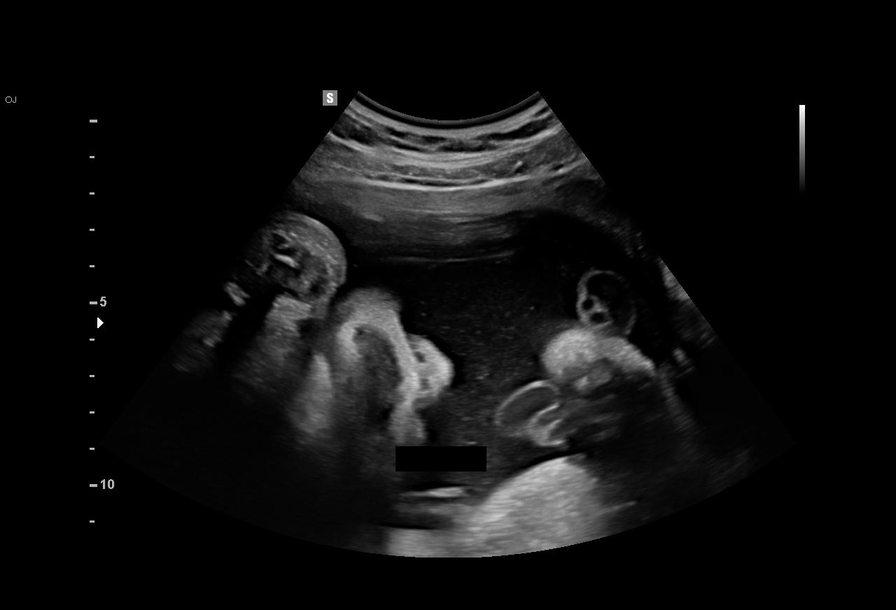
[im 18/24]
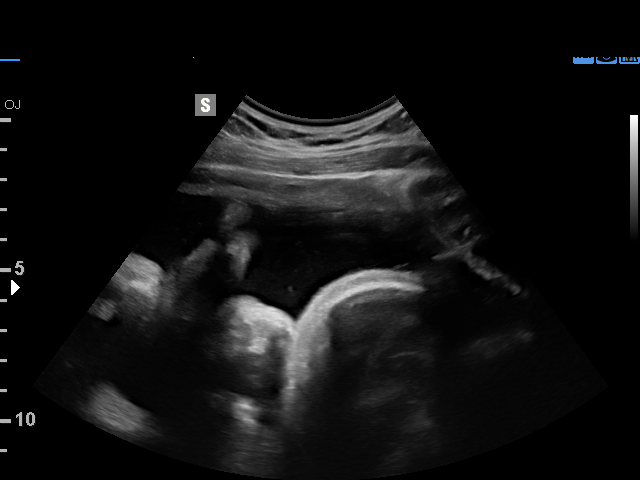
[im 20/24]
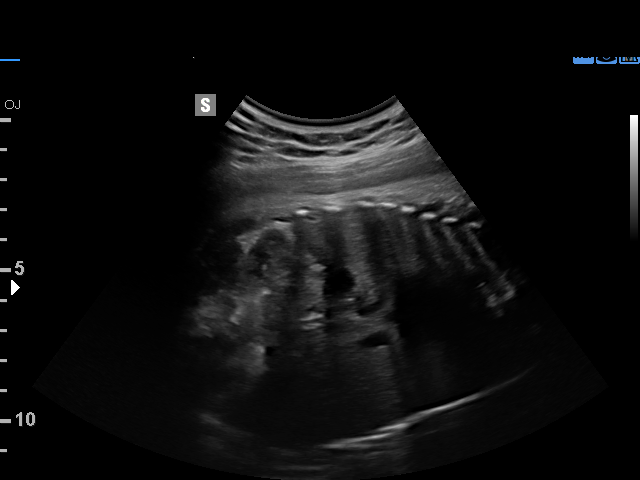
[im 22/24]
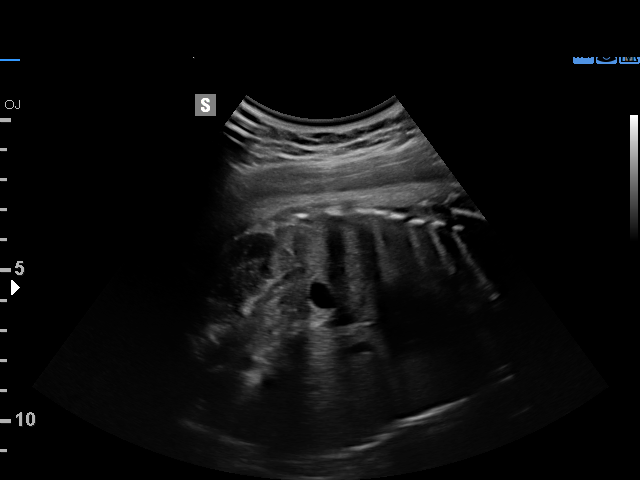
[im 24/24]
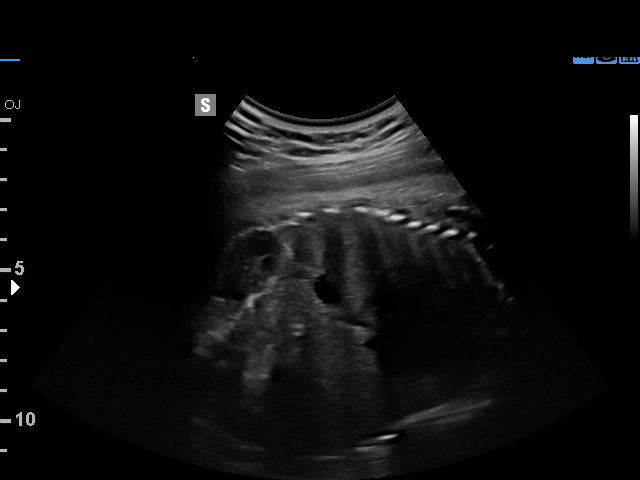

[13 of 24 positions shown; findings below may reference images not displayed]

[facility]/Gyn Clinic
[REDACTED]-
Faculty Physician

Indications

34 weeks gestation of pregnancy
Methadone use
Maternal sickle cell anemia, third trimester   O99.013,
Late to prenatal care, third trimester
Herpes simplex virus ([place]) 2
OB History

Gravidity:    3         Term:   1        Prem:   0        SAB:   1
TOP:          0       Ectopic:  0        Living: 1
Fetal Evaluation

Num Of Fetuses:     1
Fetal Heart         148
Rate(bpm):
Cardiac Activity:   Observed
Presentation:       Cephalic

Amniotic Fluid
AFI FV:      Subjectively upper-normal

AFI Sum(cm)     %Tile       Largest Pocket(cm)
19.88           74

RUQ(cm)                     LUQ(cm)        LLQ(cm)
2.9
Biophysical Evaluation

Amniotic F.V:   Within normal limits       F. Tone:        Observed
F. Movement:    Observed                   Score:          [DATE]
F. Breathing:   Observed
Gestational Age

LMP:           34w 1d       Date:   02/09/15                 EDD:   11/16/15
Best:          34w 1d    Det. By:   LMP  (02/09/15)          EDD:   11/16/15
Impression

Single IUP at 34w 1d
Sickle cell anemia
BPP [DATE]
Normal amniotic fluid volume
Recommendations

Ultrasound for growth in 2 weeks
Continue antenatal testing - 2x weekly NSTS with weekly
AFIs or weekly BPPs

## 2016-08-20 MED ORDER — HYDROMORPHONE HCL 2 MG/ML IJ SOLN
2.0000 mg | INTRAMUSCULAR | Status: AC
  Administered 2016-08-20: 2 mg via INTRAVENOUS
  Filled 2016-08-20: qty 1

## 2016-08-20 MED ORDER — PROCHLORPERAZINE EDISYLATE 5 MG/ML IJ SOLN
10.0000 mg | Freq: Once | INTRAMUSCULAR | Status: AC
  Administered 2016-08-20: 10 mg via INTRAVENOUS
  Filled 2016-08-20: qty 2

## 2016-08-20 MED ORDER — HYDROMORPHONE HCL 2 MG/ML IJ SOLN: 2.0000 mg | INTRAMUSCULAR | Status: AC

## 2016-08-20 MED ORDER — NALOXONE HCL 0.4 MG/ML IJ SOLN: 0.4000 mg | INTRAMUSCULAR | Status: DC | PRN

## 2016-08-20 MED ORDER — SODIUM CHLORIDE 0.9% FLUSH: 9.0000 mL | INTRAVENOUS | Status: DC | PRN

## 2016-08-20 MED ORDER — SENNOSIDES-DOCUSATE SODIUM 8.6-50 MG PO TABS
1.0000 | ORAL_TABLET | Freq: Two times a day (BID) | ORAL | Status: DC
  Administered 2016-08-22 – 2016-08-30 (×13): 1 via ORAL
  Filled 2016-08-20 (×17): qty 1

## 2016-08-20 MED ORDER — ONDANSETRON HCL 4 MG/2ML IJ SOLN: 4.0000 mg | Freq: Four times a day (QID) | INTRAMUSCULAR | Status: DC | PRN

## 2016-08-20 MED ORDER — VALACYCLOVIR HCL 500 MG PO TABS
1000.0000 mg | ORAL_TABLET | Freq: Every day | ORAL | Status: DC
  Administered 2016-08-20 – 2016-08-30 (×11): 1000 mg via ORAL
  Filled 2016-08-20 (×11): qty 2

## 2016-08-20 MED ORDER — KETOROLAC TROMETHAMINE 30 MG/ML IJ SOLN
15.0000 mg | Freq: Four times a day (QID) | INTRAMUSCULAR | Status: DC
  Administered 2016-08-20 – 2016-08-21 (×4): 15 mg via INTRAVENOUS
  Filled 2016-08-20 (×4): qty 1

## 2016-08-20 MED ORDER — HYDROMORPHONE HCL 2 MG/ML IJ SOLN: 2.0000 mg | INTRAMUSCULAR | Status: DC

## 2016-08-20 MED ORDER — DEXTROSE-NACL 5-0.45 % IV SOLN
INTRAVENOUS | Status: DC
  Administered 2016-08-20 – 2016-08-21 (×3): via INTRAVENOUS
  Administered 2016-08-22 – 2016-08-23 (×2): 1000 mL via INTRAVENOUS

## 2016-08-20 MED ORDER — HYDROMORPHONE HCL 2 MG/ML IJ SOLN
2.0000 mg | INTRAMUSCULAR | Status: DC
  Administered 2016-08-20: 2 mg via INTRAVENOUS
  Filled 2016-08-20: qty 1

## 2016-08-20 MED ORDER — KETOROLAC TROMETHAMINE 30 MG/ML IJ SOLN: 30.0000 mg | Freq: Once | INTRAMUSCULAR | Status: DC

## 2016-08-20 MED ORDER — SODIUM CHLORIDE 0.9 % IV SOLN
25.0000 mg | INTRAVENOUS | Status: DC | PRN
  Filled 2016-08-20: qty 0.5

## 2016-08-20 MED ORDER — RIVAROXABAN 20 MG PO TABS
20.0000 mg | ORAL_TABLET | Freq: Every day | ORAL | Status: DC
  Administered 2016-08-21 – 2016-08-30 (×10): 20 mg via ORAL
  Filled 2016-08-20 (×11): qty 1

## 2016-08-20 MED ORDER — HYDROMORPHONE 1 MG/ML IV SOLN
INTRAVENOUS | Status: DC
  Administered 2016-08-20: 2.4 mg via INTRAVENOUS
  Administered 2016-08-20: 19:00:00 via INTRAVENOUS
  Administered 2016-08-21: 4.8 mg via INTRAVENOUS
  Administered 2016-08-21: 10.2 mg via INTRAVENOUS
  Administered 2016-08-21: 10.8 mg via INTRAVENOUS
  Administered 2016-08-21: 14:00:00 via INTRAVENOUS
  Administered 2016-08-21: 3 mg via INTRAVENOUS
  Administered 2016-08-21: 2.4 mg via INTRAVENOUS
  Administered 2016-08-21 – 2016-08-22 (×3): 5.4 mg via INTRAVENOUS
  Administered 2016-08-22: 25 mg via INTRAVENOUS
  Administered 2016-08-22: 5.4 mg via INTRAVENOUS
  Administered 2016-08-22: 6 mg via INTRAVENOUS
  Administered 2016-08-22: 13.12 mg via INTRAVENOUS
  Administered 2016-08-22: 04:00:00 via INTRAVENOUS
  Administered 2016-08-23: 0.6 mg via INTRAVENOUS
  Administered 2016-08-23: 12:00:00 via INTRAVENOUS
  Administered 2016-08-23: 5.4 mg via INTRAVENOUS
  Administered 2016-08-23: 8.2 mg via INTRAVENOUS
  Administered 2016-08-23: 3 mg via INTRAVENOUS
  Filled 2016-08-20 (×5): qty 25

## 2016-08-20 MED ORDER — POLYETHYLENE GLYCOL 3350 17 G PO PACK
17.0000 g | PACK | Freq: Every day | ORAL | Status: DC | PRN
  Administered 2016-08-23: 17 g via ORAL
  Filled 2016-08-20: qty 1

## 2016-08-20 MED ORDER — DIPHENHYDRAMINE HCL 25 MG PO CAPS
25.0000 mg | ORAL_CAPSULE | ORAL | Status: DC | PRN
  Administered 2016-08-20: 25 mg via ORAL
  Filled 2016-08-20: qty 1

## 2016-08-20 MED ORDER — DIPHENHYDRAMINE HCL 25 MG PO CAPS
25.0000 mg | ORAL_CAPSULE | ORAL | Status: DC | PRN
Start: 2016-08-20 — End: 2016-08-30
  Administered 2016-08-21: 25 mg via ORAL
  Filled 2016-08-20: qty 1

## 2016-08-20 MED ORDER — PROMETHAZINE HCL 25 MG RE SUPP
25.0000 mg | Freq: Four times a day (QID) | RECTAL | Status: DC | PRN
  Administered 2016-08-20: 25 mg via RECTAL
  Filled 2016-08-20: qty 1

## 2016-08-20 MED ORDER — SODIUM CHLORIDE 0.9 % IV BOLUS (SEPSIS)
1000.0000 mL | Freq: Once | INTRAVENOUS | Status: AC
  Administered 2016-08-20: 1000 mL via INTRAVENOUS

## 2016-08-20 MED ORDER — MORPHINE SULFATE ER 30 MG PO TBCR
60.0000 mg | EXTENDED_RELEASE_TABLET | Freq: Two times a day (BID) | ORAL | Status: DC
Start: 2016-08-20 — End: 2016-08-30
  Administered 2016-08-20 – 2016-08-30 (×20): 60 mg via ORAL
  Filled 2016-08-20 (×9): qty 2
  Filled 2016-08-20: qty 4
  Filled 2016-08-20 (×10): qty 2

## 2016-08-20 MED ORDER — ONDANSETRON HCL 4 MG/2ML IJ SOLN
4.0000 mg | INTRAMUSCULAR | Status: DC | PRN
  Administered 2016-08-20 (×2): 4 mg via INTRAVENOUS
  Filled 2016-08-20 (×2): qty 2

## 2016-08-20 NOTE — H&P (Signed)
Hospital Admission Note Date: 08/20/2016  Patient name: [name] Medical record number: [id number] Date of birth: 24-May-1990 Age: 26 y.o. Gender: female PCP: Ricke Hey, MD  Attending physician: [name], MD  Chief Complaint:Vomiting and diarrhea x 1 day, Pain in back today  History of Present Illness:This is an opiate tolerant patient with Hb SS who presented to the ED with c/o vomiting and diarrhea since yesterday. Pt states that she has had multiple episodes of loose stool and also emesis that is non bilious and non hematemetous in nature. Pt had 2 episodes of emesis and diarrhea in the ED.She denies any fevers, chills, SOB, cough, dysuria or vaginal discharge. She does admit to sick contacts who also have vomiting and diarrhea.  Pt reports that she awoke this morning with pain typical of her sickle cell crisis at a level of 10/10 and localized to her low back. She received a 1L bolus of 0.9 NS and 3 doses of Dilaudid in the ED and still her pain is 10 /10. She has continued emesis ad diarrhea and continue to have pain. I am asked to admit [name] for gastroenteritis and sickle cell crisis.   Scheduled Meds: .  HYDROmorphone (DILAUDID) injection  2 mg Intravenous Q1H   Or  .  HYDROmorphone (DILAUDID) injection  2 mg Subcutaneous Q1H  . ketorolac  30 mg Intravenous Once   Continuous Infusions: . dextrose 5 % and 0.45% NaCl 125 mL/hr at 08/20/16 1341   PRN Meds:.diphenhydrAMINE, ondansetron, promethazine Allergies: Food and Latex Past Medical History:  Diagnosis Date  . Blood transfusion    "lots"  . Blood transfusion without reported diagnosis   . Chronic back pain    "very severe; have knot in my back; from tight muscle; take RX and exercise for it"  . Depression 01/06/2011  . Exertional dyspnea    "sometimes"  . Genital HSV   . GERD (gastroesophageal reflux disease) 02/17/2011  . Migraines 11/08/11   "@ least twice/month"  . 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  . Mood swings ([place]) 11/08/11   "I go back and forth; real bad"  . Sickle cell anemia ([name])   . Sickle cell anemia with crisis ([place])   . Trichotillomania    h/o   Past Surgical History:  Procedure Laterality Date  . CHOLECYSTECTOMY  05/2010  . DILATION AND CURETTAGE OF UTERUS  02/20/11   S/P miscarriage  . IR GENERIC HISTORICAL  12/23/2015   IR FLUORO GUIDE CV LINE RIGHT 12/23/2015 Jacqulynn Cadet, MD [facility]  . IR GENERIC HISTORICAL  12/23/2015   IR US GUIDE VASC ACCESS RIGHT 12/23/2015 Jacqulynn Cadet, MD [facility]   Family History  Problem Relation Age of Onset  . Sickle cell trait Mother   . Sickle cell trait Father   . Diabetes Maternal Grandmother   . Diabetes Paternal Grandmother   . Hypertension Paternal Grandmother   . Diabetes Maternal Grandfather    Social History   Social History  . Marital status: Single    Spouse name: N/A  . Number of children: N/A  . Years of education: N/A   Occupational History  . Not on file.   Social History Main Topics  . Smoking status: Former Smoker    Packs/day: 0.25    Years: 1.00    Types: Cigarettes    Quit date: 03/25/2013  . Smokeless tobacco: Never Used  . Alcohol use Yes     Comment: Twice a month  .  Drug use: No     Comment: "Clean for 3 months"  . Sexual activity: Yes    Birth control/ protection: None     Comment: last sex Jul 11 2015   Other Topics Concern  . Not on file   Social History Narrative   Lives  With mother   FOB is supportive-supposed to be moving next month   Is a student at [employer] time   Review of Systems: Pertinent items noted in HPI and remainder of comprehensive ROS otherwise negative. Physical Exam:  Intake/Output Summary (Last 24 hours) at 08/20/16 1752 Last data filed at 08/20/16 1458  Gross per 24 hour  Intake             1000 ml  Output              100 ml  Net              900 ml   General: Alert, awake, oriented x3, in moderate distress due  to pain and vomiting.  HEENT: [place]/AT PEERL, EOMI, anicteric Neck: Trachea midline,  no masses, no thyromegal,y no JVD, no carotid bruit OROPHARYNX:  Mucoas tachy. No exudate/ erythema/lesions.  Heart: Rate tachycardic. Pt hasregular rhythm, without murmurs, rubs, gallops, PMI non-displaced, no heaves or thrills on palpation.  Lungs: Clear to auscultation, no wheezing or rhonchi noted. No increased vocal fremitus resonant to percussion  Abdomen: Soft, generalized tenderness, nondistended, positive bowel sounds, no masses no hepatosplenomegaly noted. CVS tenderness bilaterally.  Neuro: No focal neurological deficits noted cranial nerves II through XII grossly intact. Strength at functional baseline in bilateral upper and lower extremities. Musculoskeletal: No warmth swelling or erythema around joints, no spinal tenderness noted. Psychiatric: Patient alert and oriented x3, good insight and cognition, good recent to remote recall. .  Lab results:  Recent Labs  08/20/16 1336  NA 139  K 3.7  CL 109  CO2 24  GLUCOSE 128*  BUN 9  CREATININE 0.48  CALCIUM 9.0    Recent Labs  08/20/16 1336  AST 47*  ALT 46  ALKPHOS 65  BILITOT 2.7*  PROT 7.7  ALBUMIN 4.5   No results for input(s): LIPASE, AMYLASE in the last 72 hours.  Recent Labs  08/20/16 1336  WBC 18.9*  NEUTROABS 17.3*  HGB 10.3*  HCT 29.1*  MCV 87.9  PLT 505*   No results for input(s): CKTOTAL, CKMB, CKMBINDEX, TROPONINI in the last 72 hours. Invalid input(s): POCBNP No results for input(s): DDIMER in the last 72 hours. No results for input(s): HGBA1C in the last 72 hours. No results for input(s): CHOL, HDL, LDLCALC, TRIG, CHOLHDL, LDLDIRECT in the last 72 hours. No results for input(s): TSH, T4TOTAL, T3FREE, THYROIDAB in the last 72 hours.  Invalid input(s): FREET3  Recent Labs  08/20/16 1336  RETICCTPCT 13.8*   Imaging results:  Dg Knee Right Port  Result Date: 07/25/2016 CLINICAL DATA:  26 year old female  with right knee pain and swelling. EXAM: PORTABLE RIGHT KNEE - 1-2 VIEW COMPARISON:  Radiograph dated 05/28/2013 FINDINGS: There is no acute fracture or dislocation. The bones are well mineralized. Areas of heterogeneity in the proximal tibia again noted as seen on the prior exam. No arthritic changes. No joint effusion. The soft tissues appear unremarkable. IMPRESSION: No acute osseous pathology.  No interval change. Electronically Signed   By: Anner Crete M.D.   On: 07/25/2016 23:45    Assessment and Plan: 1. Acute gastroenteritis: Etiology unclear. Will check stool fro c.diff and  if negative and still having diarrhea will check GI panel. Pt also has CVA tenderness so will check urinalysis.  2. Hb SS with crisis: Pt unable to tolerate oral intake. Will start PCA and she will  require a basal dose until she can take her MS Contin by mouth. Toradol and IVF.  3. Anemia of Chronic Disease: Hb stable 4. Mild Dehydration: Continue IVF. 5. Leukocytosis: Multifactorial due to gastroenteritis, crisis and dehydration.  6. Chronic Anticoagulation: Continue DOAC 7. Emesis: IV Zofran and may need to add compazine if not sufficient.   Timathy Newberry A. 08/20/2016, 5:52 PM    Conception Doebler A.  Pager [phone]. If 7PM-7AM, please contact night-coverage.

## 2016-08-20 NOTE — ED Triage Notes (Signed)
Pt complains of sickle cell pain crisis and n/v/d/abd pain since this morning. Pt states she has 10/10 pain in back.

## 2016-08-20 NOTE — ED Provider Notes (Signed)
[facility] DEPT Provider Note   CSN: [id number] Arrival date & time: 08/20/16  1207     History   Chief Complaint Chief Complaint  Patient presents with  . Sickle Cell Pain Crisis  . Emesis  . Diarrhea    HPI [name] is a 26 y.o. female.  HPI   Presents with concern for nausea, vomiting and diarrhea beginning this morning. Reports that she had a friend with similar symptoms. Reports the problem approximately 5 episodes of diarrhea, 4-5 episodes of emesis. Denies black or bloody stools. Denies fevers. Denies chest pain, shortness of breath or cough. Reports diffuse abdominal pain. Reports after she developed these symptoms, she developed back pain which is consistent with prior sickle cell disease. Denies loss control of bowel or bladder, numbness or weakness.  Past Medical History:  Diagnosis Date  . Blood transfusion    "lots"  . Blood transfusion without reported diagnosis   . Chronic back pain    "very severe; have knot in my back; from tight muscle; take RX and exercise for it"  . Depression 01/06/2011  . Exertional dyspnea    "sometimes"  . Genital HSV   . GERD (gastroesophageal reflux disease) 02/17/2011  . Migraines 11/08/11   "@ least twice/month"  . 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  . Mood swings ([place]) 11/08/11   "I go back and forth; real bad"  . Sickle cell anemia ([name])   . Sickle cell anemia with crisis ([place])   . Trichotillomania    h/o    Patient Active Problem List   Diagnosis Date Noted  . 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 Sickle cell anemia with crisis ([place]) 07/25/2016  . Right leg pain 07/25/2016  . Sickle cell crisis ([place]) 06/26/2016  . Infiltrate of lung present on imaging of chest 06/26/2016  . Chronic anticoagulation   . Sickle cell anemia ([place]) 05/19/2016  . History of DVT (deep vein thrombosis) 04/17/2016  . Post partum depression 04/03/2016  . 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 Sickle cell anemia with pain ([place]) 02/16/2016  . Thrombocytosis ([place]) 11/09/2015  . Hyperbilirubinemia 11/09/2015  . Narcotic dependence ([place]) 10/06/2015  . 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 High risk for intrapartum complications, antepartum 08/12/2015  . Chronic pain 08/04/2015  . Cocaine use 07/24/2015  . Herpes simplex 07/14/2015  . Anemia of chronic disease   . Leukocytosis   . Hb-SS disease without crisis ([place]) 02/26/2013  . Sickle cell pain crisis ([place]) 03/29/2012  . 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 Sickle cell disease ([place]) 01/08/2009  . 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 CHOLECYSTECTOMY  05/2010  . DILATION AND CURETTAGE OF UTERUS  02/20/11   S/P miscarriage  . IR GENERIC HISTORICAL  12/23/2015   IR FLUORO GUIDE CV LINE RIGHT 12/23/2015 Jacqulynn Cadet, MD [facility]-INTERV RAD  . IR GENERIC HISTORICAL  12/23/2015   IR US GUIDE VASC ACCESS RIGHT 12/23/2015 Jacqulynn Cadet, MD [facility]-INTERV RAD    OB History    Gravida Para Term Preterm AB Living   3 2 2   1 2    SAB TAB Ectopic Multiple Live Births   1     0 2      Obstetric Comments   Miscarried in October 2012 at about 7 weeks       Home Medications    Prior to Admission medications   Medication  Sig Start Date End Date Taking? Authorizing Provider  aspirin-acetaminophen-caffeine (EXCEDRIN MIGRAINE) [phone] MG tablet Take 2 tablets by mouth every 6 (six) hours as needed for headache.    Yes Historical Provider, MD  BIOTIN PO Take 2 tablets by mouth daily.   Yes Historical Provider, MD  hydroxyurea (HYDREA) 500 MG capsule Take 2 capsules (1,000 mg total) by mouth daily. May take with food to minimize GI side effects. 04/18/16  Yes Tresa Garter, MD  morphine (MS CONTIN) 60 MG 12 hr tablet Take 60 mg by mouth every 12 (twelve) hours.  07/05/16  Yes Historical Provider, MD  Multiple Vitamins-Minerals ([employer] DAILY ENERGY) TABS Take 2 tablets by mouth  daily.   Yes Historical Provider, MD  oxyCODONE (ROXICODONE) 15 MG immediate release tablet Take 15 mg by mouth every 4 (four) hours as needed for pain.  06/18/16  Yes Historical Provider, MD  Phytosterols ([employer]VANTAGE PO) Take 2 tablets by mouth daily.   Yes Historical Provider, MD  rivaroxaban (XARELTO) 20 MG TABS tablet Take 20 mg by mouth daily.    Yes Historical Provider, MD  valACYclovir (VALTREX) 1000 MG tablet Take 1 tablet (1,000 mg total) by mouth daily. 05/02/16  Yes [name], MD    Family History Family History  Problem Relation Age of Onset  . Sickle cell trait Mother   . Sickle cell trait Father   . Diabetes Maternal Grandmother   . Diabetes Paternal Grandmother   . Hypertension Paternal Grandmother   . Diabetes Maternal Grandfather     Social History Social History  Substance Use Topics  . Smoking status: Former Smoker    Packs/day: 0.25    Years: 1.00    Types: Cigarettes    Quit date: 03/25/2013  . Smokeless tobacco: Never Used  . Alcohol use Yes     Comment: Twice a month     Allergies   Food and Latex   Review of Systems Review of Systems  Constitutional: Negative for fever.  HENT: Negative for sore throat.   Eyes: Negative for visual disturbance.  Respiratory: Negative for cough and shortness of breath.   Cardiovascular: Negative for chest pain.  Gastrointestinal: Positive for abdominal pain, diarrhea, nausea and vomiting.  Genitourinary: Negative for difficulty urinating and dysuria.  Musculoskeletal: Positive for back pain. Negative for neck pain.  Skin: Negative for rash.  Neurological: Negative for syncope and headaches.     Physical Exam Updated Vital Signs BP 105/77 (BP Location: Right Arm)   Pulse 100   Temp 98.8 F (37.1 C) (Oral)   Resp 16   Ht 5\' 1"  (1.549 m)   Wt 180 lb (81.6 kg)   SpO2 95%   BMI 34.01 kg/m   Physical Exam  Constitutional: She is oriented to person, place, and time. She appears  well-developed and well-nourished. No distress.  HENT:  Head: Normocephalic and atraumatic.  Eyes: Conjunctivae and EOM are normal.  Neck: Normal range of motion.  Cardiovascular: Normal rate, regular rhythm, normal heart sounds and intact distal pulses.  Exam reveals no gallop and no friction rub.   No murmur heard. Pulmonary/Chest: Effort normal and breath sounds normal. No respiratory distress. She has no wheezes. She has no rales.  Abdominal: Soft. She exhibits no distension. There is tenderness (diffuse). There is no guarding, no tenderness at McBurney's point and negative Murphy's sign.  Musculoskeletal: She exhibits no edema.       Lumbar back: She exhibits tenderness.  Neurological: She  is alert and oriented to person, place, and time.  Skin: Skin is warm and dry. No rash noted. She is not diaphoretic. No erythema.  Nursing note and vitals reviewed.    ED Treatments / Results  Labs (all labs ordered are listed, but only abnormal results are displayed) Labs Reviewed  COMPREHENSIVE METABOLIC PANEL - Abnormal; Notable for the following:       Result Value   Glucose, Bld 128 (*)    AST 47 (*)    Total Bilirubin 2.7 (*)    All other components within normal limits  RETICULOCYTES - Abnormal; Notable for the following:    Retic Ct Pct 13.8 (*)    RBC. 3.31 (*)    Retic Count, Manual 456.8 (*)    All other components within normal limits  CBC WITH DIFFERENTIAL/PLATELET - Abnormal; Notable for the following:    WBC 18.9 (*)    RBC 3.31 (*)    Hemoglobin 10.3 (*)    HCT 29.1 (*)    RDW 17.6 (*)    Platelets 505 (*)    Neutro Abs 17.3 (*)    All other components within normal limits    EKG  EKG Interpretation None       Radiology No results found.  Procedures Procedures (including critical care time)  Medications Ordered in ED Medications  dextrose 5 %-0.45 % sodium chloride infusion ( Intravenous Transfusing/Transfer 08/20/16 1731)  HYDROmorphone (DILAUDID)  injection 2 mg (2 mg Intravenous Given 08/20/16 1621)    Or  HYDROmorphone (DILAUDID) injection 2 mg ( Subcutaneous See Alternative 08/20/16 1621)  diphenhydrAMINE (BENADRYL) capsule 25-50 mg (25 mg Oral Given 08/20/16 1500)  ondansetron (ZOFRAN) injection 4 mg (4 mg Intravenous Given 08/20/16 1341)  promethazine (PHENERGAN) suppository 25 mg (25 mg Rectal Given 08/20/16 1458)  ketorolac (TORADOL) 30 MG/ML injection 30 mg (not administered)  sodium chloride 0.9 % bolus 1,000 mL (0 mLs Intravenous Stopped 08/20/16 1458)  HYDROmorphone (DILAUDID) injection 2 mg (2 mg Intravenous Given 08/20/16 1341)    Or  HYDROmorphone (DILAUDID) injection 2 mg ( Subcutaneous See Alternative 08/20/16 1341)  HYDROmorphone (DILAUDID) injection 2 mg (2 mg Intravenous Given 08/20/16 1416)    Or  HYDROmorphone (DILAUDID) injection 2 mg ( Subcutaneous See Alternative 08/20/16 1416)  HYDROmorphone (DILAUDID) injection 2 mg (2 mg Intravenous Given 08/20/16 1458)    Or  HYDROmorphone (DILAUDID) injection 2 mg ( Subcutaneous See Alternative 08/20/16 1458)     Initial Impression / Assessment and Plan / ED Course  I have reviewed the triage vital signs and the nursing notes.  Pertinent labs & imaging results that were available during my care of the patient were reviewed by me and considered in my medical decision making (see chart for details).    26 year old female with a history of sickle cell disease, DVT, narcotic dependence presents with concern for nausea, vomiting, diarrhea and back pain consistent with prior sickle cell pain crises. Patient's abdominal exam is benign, and have low suspicion for acute intra-abdominal pathology. Suspect given her sick contacts, nausea vomiting and diarrhea she likely has a viral gastroenteritis. After receiving Zofran and Phenergan, she is able to tolerate by mouth fluids. Rehydrated with 1 L of normal saline, and then initiated D5 half-normal for treatment of sickle cell pain crisis.  Patient received  3 doses of 2 mg IV Dilaudid with continuing back pain. We'll admit to Dr. Zigmund Daniel for sickle cell pain crisis.   Final Clinical Impressions(s) / ED Diagnoses   Final  diagnoses:  Sickle cell pain crisis ([place])  Nausea vomiting and diarrhea    New Prescriptions Current Discharge Medication List       Gareth Morgan, MD 08/20/16 1751

## 2016-08-21 DIAGNOSIS — N179 Acute kidney failure, unspecified: Secondary | ICD-10-CM | POA: Diagnosis not present

## 2016-08-21 DIAGNOSIS — K529 Noninfective gastroenteritis and colitis, unspecified: Secondary | ICD-10-CM | POA: Diagnosis not present

## 2016-08-21 DIAGNOSIS — R000 Tachycardia, unspecified: Secondary | ICD-10-CM | POA: Diagnosis not present

## 2016-08-21 DIAGNOSIS — E860 Dehydration: Secondary | ICD-10-CM | POA: Diagnosis not present

## 2016-08-21 DIAGNOSIS — D638 Anemia in other chronic diseases classified elsewhere: Secondary | ICD-10-CM | POA: Diagnosis not present

## 2016-08-21 DIAGNOSIS — D5700 Hb-SS disease with crisis, unspecified: Secondary | ICD-10-CM | POA: Diagnosis not present

## 2016-08-21 DIAGNOSIS — E876 Hypokalemia: Secondary | ICD-10-CM | POA: Diagnosis not present

## 2016-08-21 DIAGNOSIS — R112 Nausea with vomiting, unspecified: Secondary | ICD-10-CM | POA: Diagnosis not present

## 2016-08-21 LAB — URINALYSIS, ROUTINE W REFLEX MICROSCOPIC
BILIRUBIN URINE: NEGATIVE
GLUCOSE, UA: NEGATIVE mg/dL
Ketones, ur: NEGATIVE mg/dL
LEUKOCYTES UA: NEGATIVE
NITRITE: NEGATIVE
PH: 5 (ref 5.0–8.0)
Protein, ur: NEGATIVE mg/dL
SPECIFIC GRAVITY, URINE: 1.012 (ref 1.005–1.030)

## 2016-08-21 MED ORDER — KETOROLAC TROMETHAMINE 30 MG/ML IJ SOLN
30.0000 mg | Freq: Four times a day (QID) | INTRAMUSCULAR | Status: DC
  Administered 2016-08-21 – 2016-08-24 (×10): 30 mg via INTRAVENOUS
  Filled 2016-08-21 (×10): qty 1

## 2016-08-21 MED ORDER — HYDROXYUREA 500 MG PO CAPS
1000.0000 mg | ORAL_CAPSULE | Freq: Every day | ORAL | Status: DC
  Administered 2016-08-21 – 2016-08-30 (×10): 1000 mg via ORAL
  Filled 2016-08-21 (×10): qty 2

## 2016-08-21 MED ORDER — L-GLUTAMINE ORAL POWDER
15.0000 g | PACK | Freq: Two times a day (BID) | ORAL | Status: DC
  Administered 2016-08-21 – 2016-08-30 (×14): 15 g via ORAL
  Filled 2016-08-21 (×18): qty 3

## 2016-08-21 MED ORDER — CAMPHOR-MENTHOL 0.5-0.5 % EX LOTN
TOPICAL_LOTION | CUTANEOUS | Status: DC | PRN
  Filled 2016-08-21: qty 222

## 2016-08-21 NOTE — Progress Notes (Signed)
SICKLE CELL SERVICE PROGRESS NOTE  [name] GYJ:[id number] DOB: 1990-07-07 DOA: 08/20/2016 PCP: Ricke Hey, MD  Assessment/Plan: Active Problems:   Sickle cell pain crisis ([place])   Hb-SS disease with crisis ([place])  1. Hb SS with crisis: Continue PCA and increase to 0.6 mg. Continue Toradol and decrease IVF to [facility].  2. Anemia of chronic disease: Hb stable.  3. Chronic pain: Continue MS Contin 4. Chronic Anticoagulation: Continue Xarelto 5. Constipation: If no BM by tomorrow will give MiraLax.  Code Status: Full Code Family Communication: N/A Disposition Plan: Not yet ready for discharge  [place].  Pager [phone]. If 7PM-7AM, please contact night-coverage.  08/21/2016, 3:25 PM  LOS: 1 day   Interim History: Pt reports pain only minimally improved and localized to the low back. Sh has used 37 mg of Dilaudid on the PCA since admission. She has not had a BM  Consultants:  None  Procedures:  None  Antibiotics:  None   Objective: Vitals:   08/21/16 0836 08/21/16 1002 08/21/16 1114 08/21/16 1305  BP:  (!) 99/53  (!) 103/51  Pulse:  (!) 104  (!) 101  Resp: 15 16 12 14   Temp:  98.3 F (36.8 C)  97.9 F (36.6 C)  TempSrc:  Oral  Oral  SpO2: 99% 97% 98% 92%  Weight:      Height:       Weight change:   Intake/Output Summary (Last 24 hours) at 08/21/16 1525 Last data filed at 08/21/16 0900  Gross per 24 hour  Intake          2269.58 ml  Output             1000 ml  Net          1269.58 ml    General: Alert, awake, oriented x3, in mild distress.  HEENT: [place]/AT PEERL, EOMI, anicteric Neck: Trachea midline,  no masses, no thyromegal,y no JVD, no carotid bruit OROPHARYNX:  Moist, No exudate/ erythema/lesions.  Heart: Regular rate and rhythm, without murmurs, rubs, gallops, PMI non-displaced, no heaves or thrills on palpation.  Lungs: Clear to auscultation, no wheezing or rhonchi noted. No increased vocal fremitus resonant to percussion  Abdomen:  Soft, nontender, nondistended, positive bowel sounds, no masses no hepatosplenomegaly noted.  Neuro: No focal neurological deficits noted cranial nerves II through XII grossly intact.  Strength at baseline in bilateral upper and lower extremities. Musculoskeletal: No warmth swelling or erythema around joints, no spinal tenderness noted. Psychiatric: Patient alert and oriented x3, good insight and cognition, good recent to remote recall.    Data Reviewed: Basic Metabolic Panel:  Recent Labs Lab 08/20/16 1336  NA 139  K 3.7  CL 109  CO2 24  GLUCOSE 128*  BUN 9  CREATININE 0.48  CALCIUM 9.0   Liver Function Tests:  Recent Labs Lab 08/20/16 1336  AST 47*  ALT 46  ALKPHOS 65  BILITOT 2.7*  PROT 7.7  ALBUMIN 4.5    Recent Labs Lab 08/20/16 1830  LIPASE 27   No results for input(s): AMMONIA in the last 168 hours. CBC:  Recent Labs Lab 08/20/16 1336  WBC 18.9*  NEUTROABS 17.3*  HGB 10.3*  HCT 29.1*  MCV 87.9  PLT 505*   Cardiac Enzymes: No results for input(s): CKTOTAL, CKMB, CKMBINDEX, TROPONINI in the last 168 hours. BNP (last 3 results) No results for input(s): BNP in the last 8760 hours.  ProBNP (last 3 results) No results for input(s): PROBNP in the last  8760 hours.  CBG: No results for input(s): GLUCAP in the last 168 hours.  No results found for this or any previous visit (from the past 240 hour(s)).   Studies: Dg Knee Right Port  Result Date: 07/25/2016 CLINICAL DATA:  26 year old female with right knee pain and swelling. EXAM: PORTABLE RIGHT KNEE - 1-2 VIEW COMPARISON:  Radiograph dated 05/28/2013 FINDINGS: There is no acute fracture or dislocation. The bones are well mineralized. Areas of heterogeneity in the proximal tibia again noted as seen on the prior exam. No arthritic changes. No joint effusion. The soft tissues appear unremarkable. IMPRESSION: No acute osseous pathology.  No interval change. Electronically Signed   By: Anner Crete  M.D.   On: 07/25/2016 23:45    Scheduled Meds: . HYDROmorphone   Intravenous Q4H  . hydroxyurea  1,000 mg Oral Daily  . ketorolac  30 mg Intravenous Q6H  . morphine  60 mg Oral Q12H  . rivaroxaban  20 mg Oral Q breakfast  . senna-docusate  1 tablet Oral BID  . valACYclovir  1,000 mg Oral Daily   Continuous Infusions: . dextrose 5 % and 0.45% NaCl 125 mL/hr at 08/21/16 1335    Active Problems:   Sickle cell pain crisis ([place])   Hb-SS disease with crisis ([name])     In excess of 25 minutes spent during this visit. In excess of 50% involved face to face contact with the patient for assessment, counseling and coordination of care.

## 2016-08-22 NOTE — Progress Notes (Signed)
Pt ambulated around unit x2, tolerated well. Pt has been very pleasant today, states she has learned her lesson and will not touch the alaris pump anymore.

## 2016-08-22 NOTE — Progress Notes (Signed)
RN entered patient's room at 0344 (08/22/2016) to check on patient and noticed the patient's O2 Sat. Monitor and CO2 end tidal monitor off. Patient denied touching the IV pump. This occurred the previous night (08/20/2016) as well, but the patient had changed the continuous fluid rate to 575ml/hr from 155ml/hr. Patient educated numerous times regarding safety and how to get assistance if IV pump alarms.

## 2016-08-23 DIAGNOSIS — D638 Anemia in other chronic diseases classified elsewhere: Secondary | ICD-10-CM | POA: Diagnosis not present

## 2016-08-23 DIAGNOSIS — K529 Noninfective gastroenteritis and colitis, unspecified: Secondary | ICD-10-CM | POA: Diagnosis not present

## 2016-08-23 DIAGNOSIS — D5700 Hb-SS disease with crisis, unspecified: Secondary | ICD-10-CM | POA: Diagnosis not present

## 2016-08-23 DIAGNOSIS — G894 Chronic pain syndrome: Secondary | ICD-10-CM | POA: Diagnosis not present

## 2016-08-23 DIAGNOSIS — R112 Nausea with vomiting, unspecified: Secondary | ICD-10-CM | POA: Diagnosis not present

## 2016-08-23 DIAGNOSIS — E876 Hypokalemia: Secondary | ICD-10-CM | POA: Diagnosis not present

## 2016-08-23 DIAGNOSIS — N179 Acute kidney failure, unspecified: Secondary | ICD-10-CM | POA: Diagnosis not present

## 2016-08-23 DIAGNOSIS — R000 Tachycardia, unspecified: Secondary | ICD-10-CM | POA: Diagnosis not present

## 2016-08-23 DIAGNOSIS — E860 Dehydration: Secondary | ICD-10-CM | POA: Diagnosis not present

## 2016-08-23 MED ORDER — OXYCODONE HCL 5 MG PO TABS
15.0000 mg | ORAL_TABLET | ORAL | Status: DC
  Administered 2016-08-23 – 2016-08-30 (×40): 15 mg via ORAL
  Filled 2016-08-23 (×40): qty 3

## 2016-08-23 MED ORDER — HYDROMORPHONE 1 MG/ML IV SOLN
INTRAVENOUS | Status: DC
  Administered 2016-08-23: 14 mg via INTRAVENOUS
  Administered 2016-08-23: 9.7 mg via INTRAVENOUS
  Administered 2016-08-24: 1.5 mg via INTRAVENOUS
  Administered 2016-08-24: 7 mg via INTRAVENOUS
  Administered 2016-08-24: 3.5 mg via INTRAVENOUS
  Administered 2016-08-24: 10 mg via INTRAVENOUS
  Administered 2016-08-24: 25 mg via INTRAVENOUS
  Administered 2016-08-24: 14.46 mg via INTRAVENOUS
  Administered 2016-08-25: 10:00:00 via INTRAVENOUS
  Administered 2016-08-25: 9 mg via INTRAVENOUS
  Administered 2016-08-25: 8.5 mg via INTRAVENOUS
  Administered 2016-08-25: 6.5 mg via INTRAVENOUS
  Administered 2016-08-25 (×2): 1 mg via INTRAVENOUS
  Administered 2016-08-25: 3.5 mg via INTRAVENOUS
  Filled 2016-08-23 (×3): qty 25

## 2016-08-23 NOTE — Progress Notes (Signed)
SICKLE CELL SERVICE PROGRESS NOTE  [name] FVC:[id number] DOB: 09-May-1991 DOA: 08/20/2016 PCP: Ricke Hey, MD  Assessment/Plan: Active Problems:   Sickle cell pain crisis ([place])   Hb-SS disease with crisis ([place])  1. Hb SS with crisis: Will schedule Oxycodone and decrease PCA to 0.5 mg bolus. Continue Toradol and decrease IVF to 50 mL/hr  2. Leukocytosis: Likely related to crisis. Will recheck labs tomorrow.  3. Anemia of chronic Disease: Hb stable. Check labs tomorrow. 4. Chronic pain: Continue MS Contin 5. Chronic Anticoagulation: Pt had recent acute DVT and is on Xarelto 20 mg daily.   Code Status: Full Code Family Communication: N/A Disposition Plan: Not yet ready for discharge  [place].  Pager [phone]. If 7PM-7AM, please contact night-coverage.  08/23/2016, 1:26 PM  LOS: 3 days  Interim History: Pt reports that pain is localized to LLE and right foot. Pain has be as high as 8/10 but is now down to 7/10 after clinician assisted dose. This is her 1st requested dose for today so will not schedule any doses. She has used 13.8 mg on the PCA  With 23/23:demands/deliveries in the last 24 hours. Pt had a BM last night.   Consultants:  None   Procedures:  None  Antibiotics:   None   Objective: Vitals:   08/23/16 0645 08/23/16 0800 08/23/16 0900 08/23/16 1222  BP: 101/70  121/84   Pulse: 83  89   Resp: 13 16 13 13   Temp: 98.7 F (37.1 C)  98.2 F (36.8 C)   TempSrc: Oral  Oral   SpO2: 95% 99% 100% 99%  Weight: 80.1 kg (176 lb 8 oz)     Height:       Weight change: -1.769 kg (-3 lb 14.4 oz)  Intake/Output Summary (Last 24 hours) at 08/23/16 1326 Last data filed at 08/23/16 1025  Gross per 24 hour  Intake            797.5 ml  Output             1800 ml  Net          -1002.5 ml    General: Alert, awake, oriented x3, in no acute distress.  HEENT: [place]/AT PEERL, EOMI, anicteric Neck: Trachea midline,  no masses, no thyromegal,y no JVD, no  carotid bruit OROPHARYNX:  Moist, No exudate/ erythema/lesions.  Heart: Regular rate and rhythm, without murmurs, rubs, gallops, PMI non-displaced, no heaves or thrills on palpation.  Lungs: Clear to auscultation, no wheezing or rhonchi noted. No increased vocal fremitus resonant to percussion.  Abdomen: Soft, nontender, nondistended, positive bowel sounds, no masses no hepatosplenomegaly noted. Neuro: No focal neurological deficits noted cranial nerves II through XII grossly intact.  Strength at functional baseline in bilateral upper and lower extremities. Musculoskeletal: No warmth swelling or erythema around joints, no spinal tenderness noted. Psychiatric: Patient alert and oriented x3, good insight and cognition, good recent to remote recall.    Data Reviewed: Basic Metabolic Panel:  Recent Labs Lab 08/20/16 1336  NA 139  K 3.7  CL 109  CO2 24  GLUCOSE 128*  BUN 9  CREATININE 0.48  CALCIUM 9.0   Liver Function Tests:  Recent Labs Lab 08/20/16 1336  AST 47*  ALT 46  ALKPHOS 65  BILITOT 2.7*  PROT 7.7  ALBUMIN 4.5    Recent Labs Lab 08/20/16 1830  LIPASE 27   No results for input(s): AMMONIA in the last 168 hours. CBC:  Recent Labs Lab 08/20/16  1336  WBC 18.9*  NEUTROABS 17.3*  HGB 10.3*  HCT 29.1*  MCV 87.9  PLT 505*   Cardiac Enzymes: No results for input(s): CKTOTAL, CKMB, CKMBINDEX, TROPONINI in the last 168 hours. BNP (last 3 results) No results for input(s): BNP in the last 8760 hours.  ProBNP (last 3 results) No results for input(s): PROBNP in the last 8760 hours.  CBG: No results for input(s): GLUCAP in the last 168 hours.  No results found for this or any previous visit (from the past 240 hour(s)).   Studies: Dg Knee Right Port  Result Date: 07/25/2016 CLINICAL DATA:  26 year old female with right knee pain and swelling. EXAM: PORTABLE RIGHT KNEE - 1-2 VIEW COMPARISON:  Radiograph dated 05/28/2013 FINDINGS: There is no acute fracture  or dislocation. The bones are well mineralized. Areas of heterogeneity in the proximal tibia again noted as seen on the prior exam. No arthritic changes. No joint effusion. The soft tissues appear unremarkable. IMPRESSION: No acute osseous pathology.  No interval change. Electronically Signed   By: Anner Crete M.D.   On: 07/25/2016 23:45    Scheduled Meds: . HYDROmorphone   Intravenous Q4H  . hydroxyurea  1,000 mg Oral Daily  . ketorolac  30 mg Intravenous Q6H  . L-glutamine  15 g Oral BID  . morphine  60 mg Oral Q12H  . oxyCODONE  15 mg Oral Q4H  . rivaroxaban  20 mg Oral Q breakfast  . senna-docusate  1 tablet Oral BID  . valACYclovir  1,000 mg Oral Daily   Continuous Infusions: . dextrose 5 % and 0.45% NaCl 1,000 mL (08/23/16 0408)    Active Problems:   Sickle cell pain crisis ([place])   Hb-SS disease with crisis ([place])   In excess of 25 minutes spent during this visit. Greater than 50% involved face to face contact with the patient for assessment, counseling and coordination of care.

## 2016-08-23 NOTE — Consult Note (Signed)
   [facility] CM Inpatient Consult   08/23/2016  [name] July 13, 1990 [id number]    Patient showing up as pending for potential [facility] Care Management services on report. However, [name] no longer has a [employer]. She now goes to Dr. Ricke Hey who is not a [facility] Provider. Therefore, no longer eligible for [facility] Care Management services. Will notify [place] Management office staff.     Marthenia Rolling, MSN-Ed, RN,BSN [facility] Liaison [phone]

## 2016-08-24 DIAGNOSIS — E876 Hypokalemia: Secondary | ICD-10-CM

## 2016-08-24 DIAGNOSIS — D638 Anemia in other chronic diseases classified elsewhere: Secondary | ICD-10-CM | POA: Diagnosis not present

## 2016-08-24 DIAGNOSIS — N179 Acute kidney failure, unspecified: Secondary | ICD-10-CM | POA: Diagnosis not present

## 2016-08-24 DIAGNOSIS — E860 Dehydration: Secondary | ICD-10-CM | POA: Diagnosis not present

## 2016-08-24 DIAGNOSIS — R000 Tachycardia, unspecified: Secondary | ICD-10-CM | POA: Diagnosis not present

## 2016-08-24 DIAGNOSIS — R112 Nausea with vomiting, unspecified: Secondary | ICD-10-CM | POA: Diagnosis not present

## 2016-08-24 DIAGNOSIS — D5700 Hb-SS disease with crisis, unspecified: Secondary | ICD-10-CM | POA: Diagnosis not present

## 2016-08-24 DIAGNOSIS — K529 Noninfective gastroenteritis and colitis, unspecified: Secondary | ICD-10-CM | POA: Diagnosis not present

## 2016-08-24 LAB — CBC WITH DIFFERENTIAL/PLATELET
BASOS ABS: 0.1 10*3/uL (ref 0.0–0.1)
BASOS PCT: 1 %
EOS ABS: 0.5 10*3/uL (ref 0.0–0.7)
Eosinophils Relative: 6 %
HCT: 18.7 % — ABNORMAL LOW (ref 36.0–46.0)
HEMOGLOBIN: 6.6 g/dL — AB (ref 12.0–15.0)
LYMPHS PCT: 25 %
Lymphs Abs: 2 10*3/uL (ref 0.7–4.0)
MCH: 30.6 pg (ref 26.0–34.0)
MCHC: 35.3 g/dL (ref 30.0–36.0)
MCV: 86.6 fL (ref 78.0–100.0)
MONOS PCT: 11 %
Monocytes Absolute: 0.9 10*3/uL (ref 0.1–1.0)
NEUTROS ABS: 4.4 10*3/uL (ref 1.7–7.7)
NEUTROS PCT: 57 %
PLATELETS: 370 10*3/uL (ref 150–400)
RBC: 2.16 MIL/uL — ABNORMAL LOW (ref 3.87–5.11)
RDW: 16.7 % — ABNORMAL HIGH (ref 11.5–15.5)
WBC: 7.9 10*3/uL (ref 4.0–10.5)

## 2016-08-24 LAB — BASIC METABOLIC PANEL
Anion gap: 4 — ABNORMAL LOW (ref 5–15)
BUN: 5 mg/dL — AB (ref 6–20)
CO2: 22 mmol/L (ref 22–32)
CREATININE: 1.02 mg/dL — AB (ref 0.44–1.00)
Calcium: 6.8 mg/dL — ABNORMAL LOW (ref 8.9–10.3)
Chloride: 108 mmol/L (ref 101–111)
Glucose, Bld: 409 mg/dL — ABNORMAL HIGH (ref 65–99)
Potassium: 2.7 mmol/L — CL (ref 3.5–5.1)
SODIUM: 134 mmol/L — AB (ref 135–145)

## 2016-08-24 LAB — RETICULOCYTES
RBC.: 2.16 MIL/uL — ABNORMAL LOW (ref 3.87–5.11)
RETIC COUNT ABSOLUTE: 246.2 10*3/uL — AB (ref 19.0–186.0)
Retic Ct Pct: 11.4 % — ABNORMAL HIGH (ref 0.4–3.1)

## 2016-08-24 LAB — LACTATE DEHYDROGENASE: LDH: 241 U/L — AB (ref 98–192)

## 2016-08-24 LAB — MAGNESIUM: MAGNESIUM: 1.3 mg/dL — AB (ref 1.7–2.4)

## 2016-08-24 MED ORDER — POTASSIUM CHLORIDE CRYS ER 20 MEQ PO TBCR
40.0000 meq | EXTENDED_RELEASE_TABLET | ORAL | Status: AC
  Administered 2016-08-24 (×2): 40 meq via ORAL
  Filled 2016-08-24 (×2): qty 2

## 2016-08-24 MED ORDER — SODIUM CHLORIDE 0.9 % IV SOLN
INTRAVENOUS | Status: AC
  Administered 2016-08-24: 12:00:00 via INTRAVENOUS

## 2016-08-24 NOTE — Progress Notes (Signed)
SICKLE CELL SERVICE PROGRESS NOTE  [name] LKG:[id number] DOB: 1991/02/09 DOA: 08/20/2016 PCP: Ricke Hey, MD  Assessment/Plan: Active Problems:   Sickle cell pain crisis ([place])   Hb-SS disease with crisis ([place])  1. Acute Kidney Injury: Labs from this morning indicates an increase in Cr level. I am unsure that these results actually reflect [name] current renal function. However I have held Toradol and increased IVF. I will recheck labs in the morning.   2. Hypokalemia: Oral replacement ordered but not yet completed thus will hold intended afternoon BMET evaluation of blood until tomorrow morning as yield would be low since intervention not completed.  3. Hb SS with crisis: Will schedule Oxycodone and decrease PCA to 0.5 mg bolus. Discontinue Toradol in light of increased Cr. Anticipate discharge home tomorrow.  4. Leukocytosis: Labs indicate resolution but this appears to be an error given previous trend of labs. 5. Anemia of chronic Disease: Hb has decreased which again does not follow [name] trend of labs. Will repeat tomorrow.  6. Chronic pain: Continue MS Contin 7. Chronic Anticoagulation: Pt had recent acute DVT and is on Xarelto 20 mg daily.   Code Status: Full Code Family Communication: N/A Disposition Plan:Anticipate discharge tomorrow.   Lunetta Marina A.  Pager [phone]. If 7PM-7AM, please contact night-coverage.  08/24/2016, 3:43 PM  LOS: 4 days  Interim History: Pt reports that pain is  Improved and localized low back.to LLE. Pt was intending to go home today. However I have explained that [name] decrease in renal function, Potassium and Hb will delay discharge until tomorrow as long as there is no further derangement.    Consultants:  None   Procedures:  None  Antibiotics:   None   Objective: Vitals:   08/24/16 0648 08/24/16 0936 08/24/16 1138 08/24/16 1425  BP: 95/63 (!) 116/55  114/69  Pulse: 89 73  89  Resp: 11 10 19 12   Temp: 98.7 F (37.1 C)  98.2 F (36.8 C)  97 F (36.1 C)  TempSrc: Oral Oral  Oral  SpO2: 99% 94% 100% 99%  Weight:      Height:       Weight change:   Intake/Output Summary (Last 24 hours) at 08/24/16 1543 Last data filed at 08/24/16 1257  Gross per 24 hour  Intake              880 ml  Output             2600 ml  Net            -1720 ml    General: Alert, awake, oriented x3, in no acute distress. Well appearing. HEENT: [place]/AT PEERL, EOMI, anicteric Neck: Trachea midline,  no masses, no thyromegal,y no JVD, no carotid bruit OROPHARYNX:  Moist, No exudate/ erythema/lesions.  Heart: Regular rate and rhythm, without murmurs, rubs, gallops, PMI non-displaced, no heaves or thrills on palpation.  Lungs: Clear to auscultation, no wheezing or rhonchi noted. No increased vocal fremitus resonant to percussion.  Abdomen: Soft, nontender, nondistended, positive bowel sounds, no masses no hepatosplenomegaly noted. Neuro: No focal neurological deficits noted cranial nerves II through XII grossly intact.  Strength at functional baseline in bilateral upper and lower extremities. Musculoskeletal: No warmth swelling or erythema around joints, no spinal tenderness noted. Psychiatric: Patient alert and oriented x3, good insight and cognition, good recent to remote recall.    Data Reviewed: Basic Metabolic Panel:  Recent Labs Lab 08/20/16 1336 08/24/16 0635  NA 139 134*  K 3.7  2.7*  CL 109 108  CO2 24 22  GLUCOSE 128* 409*  BUN 9 5*  CREATININE 0.48 1.02*  CALCIUM 9.0 6.8*  MG  --  1.3*   Liver Function Tests:  Recent Labs Lab 08/20/16 1336  AST 47*  ALT 46  ALKPHOS 65  BILITOT 2.7*  PROT 7.7  ALBUMIN 4.5    Recent Labs Lab 08/20/16 1830  LIPASE 27   No results for input(s): AMMONIA in the last 168 hours. CBC:  Recent Labs Lab 08/20/16 1336 08/24/16 0635  WBC 18.9* 7.9  NEUTROABS 17.3* 4.4  HGB 10.3* 6.6*  HCT 29.1* 18.7*  MCV 87.9 86.6  PLT 505* 370   Cardiac Enzymes: No results  for input(s): CKTOTAL, CKMB, CKMBINDEX, TROPONINI in the last 168 hours. BNP (last 3 results) No results for input(s): BNP in the last 8760 hours.  ProBNP (last 3 results) No results for input(s): PROBNP in the last 8760 hours.  CBG: No results for input(s): GLUCAP in the last 168 hours.  No results found for this or any previous visit (from the past 240 hour(s)).   Studies: Dg Knee Right Port  Result Date: 07/25/2016 CLINICAL DATA:  26 year old female with right knee pain and swelling. EXAM: PORTABLE RIGHT KNEE - 1-2 VIEW COMPARISON:  Radiograph dated 05/28/2013 FINDINGS: There is no acute fracture or dislocation. The bones are well mineralized. Areas of heterogeneity in the proximal tibia again noted as seen on the prior exam. No arthritic changes. No joint effusion. The soft tissues appear unremarkable. IMPRESSION: No acute osseous pathology.  No interval change. Electronically Signed   By: Anner Crete M.D.   On: 07/25/2016 23:45    Scheduled Meds: . HYDROmorphone   Intravenous Q4H  . hydroxyurea  1,000 mg Oral Daily  . L-glutamine  15 g Oral BID  . morphine  60 mg Oral Q12H  . oxyCODONE  15 mg Oral Q4H  . rivaroxaban  20 mg Oral Q breakfast  . senna-docusate  1 tablet Oral BID  . valACYclovir  1,000 mg Oral Daily   Continuous Infusions: . sodium chloride 100 mL/hr at 08/24/16 1131    Active Problems:   Sickle cell pain crisis ([name])   Hb-SS disease with crisis ([place])   In excess of 25 minutes spent during this visit. Greater than 50% involved face to face contact with the patient for assessment, counseling and coordination of care.

## 2016-08-24 NOTE — Progress Notes (Signed)
CRITICAL VALUE ALERT  Critical value received:  Potassium 2.7, HGB 6.6  Date of notification:  08/24/2017  Time of notification:  0827  Critical value read back:Yes.    Nurse who received alert:  Marta Lamas RN  MD notified (1st page):  Dr. Zigmund Daniel  Time of first page:  0830  MD notified (2nd page):  Time of second page:  Responding MD:  Dr. Zigmund Daniel  Time MD responded:  8882

## 2016-08-25 DIAGNOSIS — R000 Tachycardia, unspecified: Secondary | ICD-10-CM | POA: Diagnosis not present

## 2016-08-25 DIAGNOSIS — D72829 Elevated white blood cell count, unspecified: Secondary | ICD-10-CM | POA: Diagnosis not present

## 2016-08-25 DIAGNOSIS — R112 Nausea with vomiting, unspecified: Secondary | ICD-10-CM | POA: Diagnosis not present

## 2016-08-25 DIAGNOSIS — D5700 Hb-SS disease with crisis, unspecified: Secondary | ICD-10-CM | POA: Diagnosis not present

## 2016-08-25 DIAGNOSIS — E876 Hypokalemia: Secondary | ICD-10-CM | POA: Diagnosis not present

## 2016-08-25 DIAGNOSIS — Z86718 Personal history of other venous thrombosis and embolism: Secondary | ICD-10-CM | POA: Diagnosis not present

## 2016-08-25 DIAGNOSIS — A6000 Herpesviral infection of urogenital system, unspecified: Secondary | ICD-10-CM | POA: Diagnosis not present

## 2016-08-25 DIAGNOSIS — K529 Noninfective gastroenteritis and colitis, unspecified: Secondary | ICD-10-CM | POA: Diagnosis not present

## 2016-08-25 DIAGNOSIS — Z87891 Personal history of nicotine dependence: Secondary | ICD-10-CM | POA: Diagnosis not present

## 2016-08-25 DIAGNOSIS — E860 Dehydration: Secondary | ICD-10-CM | POA: Diagnosis not present

## 2016-08-25 DIAGNOSIS — F329 Major depressive disorder, single episode, unspecified: Secondary | ICD-10-CM | POA: Diagnosis not present

## 2016-08-25 DIAGNOSIS — N179 Acute kidney failure, unspecified: Secondary | ICD-10-CM | POA: Diagnosis not present

## 2016-08-25 DIAGNOSIS — K219 Gastro-esophageal reflux disease without esophagitis: Secondary | ICD-10-CM | POA: Diagnosis not present

## 2016-08-25 LAB — CBC WITH DIFFERENTIAL/PLATELET
BASOS ABS: 0.2 10*3/uL — AB (ref 0.0–0.1)
Basophils Relative: 2 %
EOS PCT: 6 %
Eosinophils Absolute: 0.7 10*3/uL (ref 0.0–0.7)
HEMATOCRIT: 23.6 % — AB (ref 36.0–46.0)
Hemoglobin: 8 g/dL — ABNORMAL LOW (ref 12.0–15.0)
LYMPHS ABS: 3.4 10*3/uL (ref 0.7–4.0)
Lymphocytes Relative: 31 %
MCH: 29.4 pg (ref 26.0–34.0)
MCHC: 33.9 g/dL (ref 30.0–36.0)
MCV: 86.8 fL (ref 78.0–100.0)
MONO ABS: 1.1 10*3/uL — AB (ref 0.1–1.0)
Monocytes Relative: 10 %
NEUTROS ABS: 5.6 10*3/uL (ref 1.7–7.7)
Neutrophils Relative %: 51 %
Platelets: 447 10*3/uL — ABNORMAL HIGH (ref 150–400)
RBC: 2.72 MIL/uL — AB (ref 3.87–5.11)
RDW: 17.6 % — AB (ref 11.5–15.5)
WBC: 11 10*3/uL — AB (ref 4.0–10.5)

## 2016-08-25 LAB — BASIC METABOLIC PANEL
Anion gap: 6 (ref 5–15)
BUN: 9 mg/dL (ref 6–20)
CO2: 27 mmol/L (ref 22–32)
CREATININE: 0.66 mg/dL (ref 0.44–1.00)
Calcium: 8.7 mg/dL — ABNORMAL LOW (ref 8.9–10.3)
Chloride: 106 mmol/L (ref 101–111)
GFR calc Af Amer: >60 mL/min (ref >60)
Glucose, Bld: 92 mg/dL (ref 65–99)
Potassium: 4.1 mmol/L (ref 3.5–5.1)
SODIUM: 139 mmol/L (ref 135–145)

## 2016-08-25 LAB — RETICULOCYTES
RBC.: 2.72 MIL/uL — AB (ref 3.87–5.11)
Retic Count, Absolute: 340 10*3/uL — ABNORMAL HIGH (ref 19.0–186.0)
Retic Ct Pct: 12.5 % — ABNORMAL HIGH (ref 0.4–3.1)

## 2016-08-25 MED ORDER — LORAZEPAM 2 MG/ML IJ SOLN
0.5000 mg | Freq: Once | INTRAMUSCULAR | Status: AC
  Administered 2016-08-25: 0.5 mg via INTRAVENOUS
  Filled 2016-08-25: qty 1

## 2016-08-25 MED ORDER — HYDROMORPHONE BOLUS VIA INFUSION
1.0000 mg | Freq: Once | INTRAVENOUS | Status: DC
  Administered 2016-08-25: 1 mg via INTRAVENOUS

## 2016-08-25 MED ORDER — HYDROMORPHONE HCL 2 MG/ML IJ SOLN
2.0000 mg | INTRAMUSCULAR | Status: AC
  Administered 2016-08-25 – 2016-08-26 (×12): 2 mg via INTRAVENOUS
  Filled 2016-08-25 (×12): qty 1

## 2016-08-25 MED ORDER — KETOROLAC TROMETHAMINE 30 MG/ML IJ SOLN
30.0000 mg | Freq: Once | INTRAMUSCULAR | Status: AC
  Administered 2016-08-25: 30 mg via INTRAVENOUS
  Filled 2016-08-25: qty 1

## 2016-08-25 MED ORDER — HYDROMORPHONE 1 MG/ML IV SOLN
INTRAVENOUS | Status: DC
  Administered 2016-08-25: 1 mg via INTRAVENOUS
  Administered 2016-08-25: 2 mg via INTRAVENOUS
  Administered 2016-08-26: 1 mg via INTRAVENOUS
  Administered 2016-08-26: 7.2 mg via INTRAVENOUS
  Administered 2016-08-26: 17:00:00 via INTRAVENOUS
  Administered 2016-08-26: 3 mg via INTRAVENOUS
  Administered 2016-08-26: 6 mg via INTRAVENOUS
  Administered 2016-08-27: 9.6 mg via INTRAVENOUS
  Administered 2016-08-27: 21:00:00 via INTRAVENOUS
  Administered 2016-08-27: 7.2 mg via INTRAVENOUS
  Administered 2016-08-27: 4.8 mg via INTRAVENOUS
  Administered 2016-08-27: 1 mg via INTRAVENOUS
  Administered 2016-08-28: 7.8 mg via INTRAVENOUS
  Administered 2016-08-28: 4.79 mg via INTRAVENOUS
  Administered 2016-08-28: 10:00:00 via INTRAVENOUS
  Administered 2016-08-28: 3.6 mg via INTRAVENOUS
  Administered 2016-08-28: 7.8 mg via INTRAVENOUS
  Administered 2016-08-28: 12 mg via INTRAVENOUS
  Administered 2016-08-28: 3.6 mg via INTRAVENOUS
  Administered 2016-08-29 (×2): 4.8 mg via INTRAVENOUS
  Administered 2016-08-29: 8.4 mg via INTRAVENOUS
  Administered 2016-08-29: 6 mg via INTRAVENOUS
  Administered 2016-08-29 (×2): via INTRAVENOUS
  Administered 2016-08-29: 6 mg via INTRAVENOUS
  Administered 2016-08-29: 9 mg via INTRAVENOUS
  Administered 2016-08-30: 30 mg via INTRAVENOUS
  Administered 2016-08-30: 13.79 mg via INTRAVENOUS
  Administered 2016-08-30: 5.6 mg via INTRAVENOUS
  Administered 2016-08-30: 3.6 mg via INTRAVENOUS
  Administered 2016-08-30: 0 mg via INTRAVENOUS
  Filled 2016-08-25 (×8): qty 25

## 2016-08-25 MED ORDER — HYDROMORPHONE HCL 2 MG/ML IJ SOLN
2.0000 mg | Freq: Once | INTRAMUSCULAR | Status: AC
  Administered 2016-08-25: 2 mg via INTRAVENOUS

## 2016-08-25 MED ORDER — MUSCLE RUB 10-15 % EX CREA
TOPICAL_CREAM | CUTANEOUS | Status: DC | PRN
  Administered 2016-08-26: 01:00:00 via TOPICAL
  Filled 2016-08-25: qty 85

## 2016-08-25 NOTE — Care Management Important Message (Signed)
Important Message  Patient Details  Name: [name] MRN: [id number] Date of Birth: 12/16/1990   Medicare Important Message Given:  Yes    Kerin Salen 08/25/2016, 10:06 AMImportant Message  Patient Details  Name: [name] MRN: [id number] Date of Birth: July 24, 1990   Medicare Important Message Given:  Yes    Kerin Salen 08/25/2016, 10:06 AM

## 2016-08-25 NOTE — Progress Notes (Signed)
Patient refuses to ambulate in hallway at this times states that her shoulder is hurting and she can not walk until she gets something for shoulder pain. Will notify MD when MD is on call at 0800.

## 2016-08-25 NOTE — Progress Notes (Signed)
Patient has requested to take a hot shower for muscle relaxation. Dr. Zigmund Daniel notified and made aware of patients request and MD has denied patients request at this time due to BP elevated and uncontrolled pain. Patient made aware.

## 2016-08-25 NOTE — Progress Notes (Signed)
   08/25/16 1300  Clinical Encounter Type  Visited With Patient  Visit Type Psychological support;Social support  Counselor visited briefly with patient to offer emotional and social support. Patient appeared tearful and indicated she was experiencing a great deal of pain throughout her body, and counselor concluded that it was not a good time to talk. Counselor explained that this might be our last encounter, as counselor's internship ends in 2 weeks. Patient indicated that she will most likely remain admitted over the weekend due to her level of pain, so counselor will check in with patient again as schedules permit.   Lamount Cohen, Counseling Intern Department for Spiritual Care and [facility] Supervisor - [employer], [place]

## 2016-08-25 NOTE — Progress Notes (Addendum)
SICKLE CELL SERVICE PROGRESS NOTE  [name] ION:[id number] DOB: 01/13/1991 DOA: 08/20/2016 PCP: Ricke Hey, MD  Assessment/Plan: Active Problems:   Hypokalemia   Anemia of chronic disease   Leukocytosis   History of DVT (deep vein thrombosis)   Hb-SS disease with crisis ([place])   Acute kidney injury ([place])  1. Hb SS with crisis: Pt crying in sever pain which is a departure from yesterday. Will resume Toradol and increase PCA bolus back to 0.6 mg. Re-assess pain this afternoon.  2. Acute Kidney Injury: Resolved. It is likely that the lab results from yesterday were in accurate. 3. Hypokalemia: Resolved with oral replacement.  4. Leukocytosis: Likely secondary to crisis. No evidence of infection.  5. Anemia of chronic Disease: Hb back at baseline without any intervention. Continue Hydrea. 6. Chronic pain: Continue MS Contin 7. Chronic Anticoagulation: Pt had recent acute DVT and is on Xarelto 20 mg daily.   Code Status: Full Code Family Communication: N/A Disposition Plan:Not ready fro discharge today.   MATTHEWS,MICHELLE A.  Pager [phone]. If 7PM-7AM, please contact night-coverage.  08/25/2016, 1:03 PM  LOS: 5 days  Interim History: Received a call from patients nurse this morning stating that patient was crying and reporting an increase in pain to a new location (right shoulder) at an intensity of 10/10. Medication given and patient still c/o pain 10/10 and now down RUE and left knee. Pt reports that she had a sudden onset of throbbing pain to the right shoulder starting about 5:30 this morning that was severe enough to awaken her from sleep. Currently she is still c/o sever pain in RUE at an intensity of 10/10. She denies any other symptoms and except for pain in the location of left knee. Pt has maintained her appetite. She has  used 42.96 mg of Dilaudid in the last 24 hours with 85/82:demands/deliveries.    Consultants:  None    Procedures:  None  Antibiotics:   None   Objective: Vitals:   08/25/16 0415 08/25/16 0935 08/25/16 0944 08/25/16 1149  BP: 130/75 121/71    Pulse: 89 84    Resp: 18 11 12 14   Temp: 98.4 F (36.9 C) 98.7 F (37.1 C)    TempSrc: Oral Oral    SpO2: 98% 98% 97% 99%  Weight:      Height:       Weight change:   Intake/Output Summary (Last 24 hours) at 08/25/16 1303 Last data filed at 08/25/16 7253  Gross per 24 hour  Intake              400 ml  Output             3400 ml  Net            -3000 ml    General: Alert, awake, oriented x3, in moderately acute distress due to pain. HEENT: [place]/AT PEERL, EOMI, anicteric Neck: Trachea midline,  no masses, no thyromegal,y no JVD, no carotid bruit OROPHARYNX:  Moist, No exudate/ erythema/lesions.  Heart: Regular rate and rhythm, without murmurs, rubs, gallops, PMI non-displaced, no heaves or thrills on palpation.  Lungs: Clear to auscultation, no wheezing or rhonchi noted. No increased vocal fremitus resonant to percussion.  Abdomen: Soft, nontender, nondistended, positive bowel sounds, no masses no hepatosplenomegaly noted. Neuro: No focal neurological deficits noted cranial nerves II through XII grossly intact.  Strength at functional baseline in bilateral upper and lower extremities. Musculoskeletal: No warmth swelling or erythema around joints, no spinal tenderness noted.  Psychiatric: Patient alert and oriented x3, good insight and cognition, good recent to remote recall.    Data Reviewed: Basic Metabolic Panel:  Recent Labs Lab 08/20/16 1336 08/24/16 0635 08/25/16 0658  NA 139 134* 139  K 3.7 2.7* 4.1  CL 109 108 106  CO2 24 22 27   GLUCOSE 128* 409* 92  BUN 9 5* 9  CREATININE 0.48 1.02* 0.66  CALCIUM 9.0 6.8* 8.7*  MG  --  1.3*  --    Liver Function Tests:  Recent Labs Lab 08/20/16 1336  AST 47*  ALT 46  ALKPHOS 65  BILITOT 2.7*  PROT 7.7  ALBUMIN 4.5    Recent Labs Lab 08/20/16 1830  LIPASE 27    No results for input(s): AMMONIA in the last 168 hours. CBC:  Recent Labs Lab 08/20/16 1336 08/24/16 0635 08/25/16 0658  WBC 18.9* 7.9 11.0*  NEUTROABS 17.3* 4.4 5.6  HGB 10.3* 6.6* 8.0*  HCT 29.1* 18.7* 23.6*  MCV 87.9 86.6 86.8  PLT 505* 370 447*   Cardiac Enzymes: No results for input(s): CKTOTAL, CKMB, CKMBINDEX, TROPONINI in the last 168 hours. BNP (last 3 results) No results for input(s): BNP in the last 8760 hours.  ProBNP (last 3 results) No results for input(s): PROBNP in the last 8760 hours.  CBG: No results for input(s): GLUCAP in the last 168 hours.  No results found for this or any previous visit (from the past 240 hour(s)).   Studies: No results found.  Scheduled Meds: . HYDROmorphone   Intravenous Q4H  . hydroxyurea  1,000 mg Oral Daily  . L-glutamine  15 g Oral BID  . morphine  60 mg Oral Q12H  . oxyCODONE  15 mg Oral Q4H  . rivaroxaban  20 mg Oral Q breakfast  . senna-docusate  1 tablet Oral BID  . valACYclovir  1,000 mg Oral Daily   Continuous Infusions:   Active Problems:   Hypokalemia   Anemia of chronic disease   Leukocytosis   History of DVT (deep vein thrombosis)   Hb-SS disease with crisis ([place])   Acute kidney injury ([place])   In excess of 25 minutes spent during this visit. Greater than 50% involved face to face contact with the patient for assessment, counseling and coordination of care.    3:22 pm Nurse reports patient crying out stating worst pain in her arm that she has ever had. HR in 140's.   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 Will schedule Dilaudid every 2 hours in addition to her PCA and continue Toradol. Also will obtain a 12 lead-EKG.   MATTHEWS,MICHELLE A.

## 2016-08-26 DIAGNOSIS — E876 Hypokalemia: Secondary | ICD-10-CM | POA: Diagnosis not present

## 2016-08-26 DIAGNOSIS — N179 Acute kidney failure, unspecified: Secondary | ICD-10-CM | POA: Diagnosis not present

## 2016-08-26 DIAGNOSIS — K529 Noninfective gastroenteritis and colitis, unspecified: Secondary | ICD-10-CM | POA: Diagnosis not present

## 2016-08-26 DIAGNOSIS — R112 Nausea with vomiting, unspecified: Secondary | ICD-10-CM | POA: Diagnosis not present

## 2016-08-26 DIAGNOSIS — D5700 Hb-SS disease with crisis, unspecified: Secondary | ICD-10-CM | POA: Diagnosis not present

## 2016-08-26 DIAGNOSIS — E860 Dehydration: Secondary | ICD-10-CM | POA: Diagnosis not present

## 2016-08-26 DIAGNOSIS — R000 Tachycardia, unspecified: Secondary | ICD-10-CM | POA: Diagnosis not present

## 2016-08-26 LAB — CBC WITH DIFFERENTIAL/PLATELET
Basophils Absolute: 0.2 10*3/uL — ABNORMAL HIGH (ref 0.0–0.1)
Basophils Relative: 1 %
EOS PCT: 1 %
Eosinophils Absolute: 0.2 10*3/uL (ref 0.0–0.7)
HEMATOCRIT: 23.9 % — AB (ref 36.0–46.0)
HEMOGLOBIN: 8.5 g/dL — AB (ref 12.0–15.0)
LYMPHS ABS: 2.3 10*3/uL (ref 0.7–4.0)
LYMPHS PCT: 14 %
MCH: 31 pg (ref 26.0–34.0)
MCHC: 35.6 g/dL (ref 30.0–36.0)
MCV: 87.2 fL (ref 78.0–100.0)
MONOS PCT: 12 %
Monocytes Absolute: 1.9 10*3/uL — ABNORMAL HIGH (ref 0.1–1.0)
Neutro Abs: 11.5 10*3/uL — ABNORMAL HIGH (ref 1.7–7.7)
Neutrophils Relative %: 72 %
Platelets: 477 10*3/uL — ABNORMAL HIGH (ref 150–400)
RBC: 2.74 MIL/uL — AB (ref 3.87–5.11)
RDW: 17.9 % — ABNORMAL HIGH (ref 11.5–15.5)
WBC: 16.1 10*3/uL — AB (ref 4.0–10.5)

## 2016-08-26 LAB — COMPREHENSIVE METABOLIC PANEL
ALBUMIN: 3.9 g/dL (ref 3.5–5.0)
ALK PHOS: 56 U/L (ref 38–126)
ALT: 68 U/L — AB (ref 14–54)
ANION GAP: 8 (ref 5–15)
AST: 38 U/L (ref 15–41)
BUN: 8 mg/dL (ref 6–20)
CHLORIDE: 103 mmol/L (ref 101–111)
CO2: 25 mmol/L (ref 22–32)
CREATININE: 0.53 mg/dL (ref 0.44–1.00)
Calcium: 8.7 mg/dL — ABNORMAL LOW (ref 8.9–10.3)
GFR calc Af Amer: >60 mL/min (ref >60)
Glucose, Bld: 123 mg/dL — ABNORMAL HIGH (ref 65–99)
POTASSIUM: 3.6 mmol/L (ref 3.5–5.1)
Sodium: 136 mmol/L (ref 135–145)
Total Bilirubin: 2.4 mg/dL — ABNORMAL HIGH (ref 0.3–1.2)
Total Protein: 7.2 g/dL (ref 6.5–8.1)

## 2016-08-26 NOTE — Progress Notes (Signed)
SICKLE CELL SERVICE PROGRESS NOTE  [name] KGM:[id number] DOB: 09/19/1990 DOA: 08/20/2016 PCP: Ricke Hey, MD  Assessment/Plan: Active Problems:   Hypokalemia   Anemia of chronic disease   Leukocytosis   History of DVT (deep vein thrombosis)   Hb-SS disease with crisis ([place])   Acute kidney injury ([place])  1. Hb SS with crisis: Pt still complaining of 10 out of 10 pain especially in her arms. She is on the PCA protocol appears to be using it adequately. She denied any fever or chills. She was able to walk to the bathroom but not able to use her arms. At this point I'll continue with the Dilaudid PCA Toradol and occasional bolus doses. 2. Acute Kidney Injury: Resolved. 3. Hypokalemia: Resolved  4. Leukocytosis: Likely secondary to crisis. No evidence of infection.  5. Anemia of chronic Disease: Hb back at baseline without any intervention. Continue Hydrea. 6. Chronic pain: Continue MS Contin 7. Chronic Anticoagulation: Pt had recent acute DVT and is on Xarelto 20 mg daily.   Code Status: Full Code Family Communication: N/A Disposition Plan:Not ready fro discharge today.   [facility]  Pager [phone]. If 7PM-7AM, please contact night-coverage.  08/26/2016, 10:45 AM  LOS: 6 days  Interim History: Received a call from patients nurse this morning stating that patient was crying and reporting an increase in pain to a new location (right shoulder) at an intensity of 10/10. Medication given and patient still c/o pain 10/10 and now down RUE and left knee. Pt reports that she had a sudden onset of throbbing pain to the right shoulder starting about 5:30 this morning that was severe enough to awaken her from sleep. Currently she is still c/o sever pain in RUE at an intensity of 10/10. She denies any other symptoms and except for pain in the location of left knee. Pt has maintained her appetite. She has  used 42.96 mg of Dilaudid in the last 24 hours with 85/82:demands/deliveries.     Consultants:  None   Procedures:  None  Antibiotics:   None   Objective: Vitals:   08/26/16 0356 08/26/16 0500 08/26/16 0759 08/26/16 0955  BP: 127/88 122/78  116/73  Pulse: (!) 123 (!) 118  (!) 105  Resp: 18 16 12 13   Temp: 100.1 F (37.8 C) 98.1 F (36.7 C)  98.8 F (37.1 C)  TempSrc: Oral Oral  Oral  SpO2: 98% 97% 97% 98%  Weight:      Height:       Weight change: -0.2 kg (-7.1 oz)  Intake/Output Summary (Last 24 hours) at 08/26/16 1045 Last data filed at 08/26/16 0900  Gross per 24 hour  Intake          1425.46 ml  Output             3450 ml  Net         -2024.54 ml    General: Alert, awake, oriented x3, in moderately acute distress due to pain. HEENT: [place]/AT PEERL, EOMI, anicteric Neck: Trachea midline,  no masses, no thyromegal,y no JVD, no carotid bruit OROPHARYNX:  Moist, No exudate/ erythema/lesions.  Heart: Regular rate and rhythm, without murmurs, rubs, gallops, PMI non-displaced, no heaves or thrills on palpation.  Lungs: Clear to auscultation, no wheezing or rhonchi noted. No increased vocal fremitus resonant to percussion.  Abdomen: Soft, nontender, nondistended, positive bowel sounds, no masses no hepatosplenomegaly noted. Neuro: No focal neurological deficits noted cranial nerves II through XII grossly intact.  Strength at functional  baseline in bilateral upper and lower extremities. Musculoskeletal: No warmth swelling or erythema around joints, no spinal tenderness noted. Psychiatric: Patient alert and oriented x3, good insight and cognition, good recent to remote recall.    Data Reviewed: Basic Metabolic Panel:  Recent Labs Lab 08/20/16 1336 08/24/16 0635 08/25/16 0658  NA 139 134* 139  K 3.7 2.7* 4.1  CL 109 108 106  CO2 24 22 27   GLUCOSE 128* 409* 92  BUN 9 5* 9  CREATININE 0.48 1.02* 0.66  CALCIUM 9.0 6.8* 8.7*  MG  --  1.3*  --    Liver Function Tests:  Recent Labs Lab 08/20/16 1336  AST 47*  ALT 46  ALKPHOS 65   BILITOT 2.7*  PROT 7.7  ALBUMIN 4.5    Recent Labs Lab 08/20/16 1830  LIPASE 27   No results for input(s): AMMONIA in the last 168 hours. CBC:  Recent Labs Lab 08/20/16 1336 08/24/16 0635 08/25/16 0658  WBC 18.9* 7.9 11.0*  NEUTROABS 17.3* 4.4 5.6  HGB 10.3* 6.6* 8.0*  HCT 29.1* 18.7* 23.6*  MCV 87.9 86.6 86.8  PLT 505* 370 447*   Cardiac Enzymes: No results for input(s): CKTOTAL, CKMB, CKMBINDEX, TROPONINI in the last 168 hours. BNP (last 3 results) No results for input(s): BNP in the last 8760 hours.  ProBNP (last 3 results) No results for input(s): PROBNP in the last 8760 hours.  CBG: No results for input(s): GLUCAP in the last 168 hours.  No results found for this or any previous visit (from the past 240 hour(s)).   Studies: No results found.  Scheduled Meds: . HYDROmorphone   Intravenous Q4H  .  HYDROmorphone (DILAUDID) injection  2 mg Intravenous Q2H  . hydroxyurea  1,000 mg Oral Daily  . L-glutamine  15 g Oral BID  . morphine  60 mg Oral Q12H  . oxyCODONE  15 mg Oral Q4H  . rivaroxaban  20 mg Oral Q breakfast  . senna-docusate  1 tablet Oral BID  . valACYclovir  1,000 mg Oral Daily   Continuous Infusions:   Active Problems:   Hypokalemia   Anemia of chronic disease   Leukocytosis   History of DVT (deep vein thrombosis)   Hb-SS disease with crisis ([place])   Acute kidney injury ([place])   In excess of 25 minutes spent during this visit. Greater than 50% involved face to face contact with the patient for assessment, counseling and coordination of care.    3:22 pm Nurse reports patient crying out stating worst pain in her arm that she has ever had. HR in 140's.   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 Will schedule Dilaudid every 2 hours in addition to her PCA and continue Toradol. Also will obtain a 12 lead-EKG.   Munir Victorian,LAWAL

## 2016-08-26 NOTE — Progress Notes (Signed)
MD on call was paged in reference to patient with Tmax 101.5 and 101.2. This Probation officer asked if they wanted to provide orders because patient does not have PRN Tylenol. No advice so patient was monitored throughout the night. This morning the patient temperature has resolved on its own at 98.1 Will continue to monitor.

## 2016-08-26 NOTE — Care Management Note (Signed)
Case Management Note  Patient Details  Name: [name] MRN: [id number] Date of Birth: [birth year]  Subjective/Objective:     26 yo admitted with Hb-SS disease with crisis.               Action/Plan: Pt from home with children. Chart reviewed and no CM needs noted at this time. CM will continue to follow.  Expected Discharge Date:                  Expected Discharge Plan:  Home/Self Care  In-House Referral:     Discharge planning Services  CM Consult  Post Acute Care Choice:    Choice offered to:     DME Arranged:    DME Agency:     HH Arranged:    HH Agency:     Status of Service:  In process, will continue to follow  If discussed at Long Length of Stay Meetings, dates discussed:    Additional CommentsLynnell Catalan, RN 08/26/2016, 10:52 AM [phone]

## 2016-08-27 DIAGNOSIS — D5700 Hb-SS disease with crisis, unspecified: Secondary | ICD-10-CM | POA: Diagnosis not present

## 2016-08-27 MED ORDER — HYDROMORPHONE HCL 2 MG/ML IJ SOLN
1.0000 mg | Freq: Once | INTRAMUSCULAR | Status: AC
  Administered 2016-08-27: 1 mg via INTRAVENOUS
  Filled 2016-08-27: qty 1

## 2016-08-27 NOTE — Progress Notes (Signed)
Patient is requesting a one time dose of IV Dilaudid for increased pain in bilateral arms. Page MD on call and order was provided. Will monitor.

## 2016-08-27 NOTE — Progress Notes (Signed)
Pt in tears c/o 10/10 pain in her arms, she stated she had spoken to the MD about this and nothing was ordered. She went on to describe how unusual it is for her crisis to last this long and for the pain to be this intense this far into it. I cleared the PCA pump for 2000 and she had used about 80% of what was available to her for that 4 hour period. I paged the on call MD and received an order for dilaudid 1mg  x1. Pt stated this helped for a little while, but pain is still severe. Pts mother asked to speak to someone "over the doctors" and she spoke with the [place]. The [facility] also stated he will come to visit the pt when he gets a chance tonight. Continue to monitor. Hortencia Conradi RN.

## 2016-08-27 NOTE — Progress Notes (Signed)
SICKLE CELL SERVICE PROGRESS NOTE  NYIMA VANACKER GBT:[id number] DOB: 1990/10/26 DOA: 08/20/2016 PCP: Ricke Hey, MD  Assessment/Plan: Active Problems:   Hypokalemia   Anemia of chronic disease   Leukocytosis   History of DVT (deep vein thrombosis)   Hb-SS disease with crisis ([place])   Acute kidney injury ([place])  1. Hb SS with crisis: Pt still complaining of 9 out of 10 pain especially in her arms. Patient has had Dilaudid PCA physician assisted dosing which has been discontinued. She is asking for it to be restarted. She has used 46 mg of the Dilaudid was 62 demands and 60 deliveries. At this point I'll continue with the Dilaudid PCA Toradol. No bolus doses. 2. Acute Kidney Injury: Resolved. 3. Hypokalemia: Resolved  4. Leukocytosis: Likely secondary to crisis. No evidence of infection.  5. Anemia of chronic Disease: Hb back at baseline without any intervention. Continue Hydrea. 6. Chronic pain: Continue MS Contin 7. Chronic Anticoagulation: Pt had recent acute DVT and is on Xarelto 20 mg daily.   Code Status: Full Code Family Communication: N/A Disposition Plan:Not ready fro discharge today.   [facility]  Pager [phone]. If 7PM-7AM, please contact night-coverage.  08/27/2016, 8:23 AM  LOS: 7 days  Interim History: Received a call from patients nurse this morning stating that patient was crying and reporting an increase in pain to a new location (right shoulder) at an intensity of 10/10. Medication given and patient still c/o pain 10/10 and now down RUE and left knee. Pt reports that she had a sudden onset of throbbing pain to the right shoulder starting about 5:30 this morning that was severe enough to awaken her from sleep. Currently she is still c/o sever pain in RUE at an intensity of 10/10. She denies any other symptoms and except for pain in the location of left knee. Pt has maintained her appetite. She has  used 42.96 mg of Dilaudid in the last 24 hours with  85/82:demands/deliveries.    Consultants:  None   Procedures:  None  Antibiotics:   None   Objective: Vitals:   08/27/16 0135 08/27/16 0141 08/27/16 0423 08/27/16 0430  BP: 126/67  127/89   Pulse: 89  (!) 111   Resp: 14  12   Temp: 99.9 F (37.7 C) (!) 101.5 F (38.6 C) (!) 101 F (38.3 C) 99.1 F (37.3 C)  TempSrc: Oral Oral Oral Oral  SpO2: 99%  98%   Weight:  84 kg (185 lb 3 oz)    Height:       Weight change: 2 kg (4 lb 6.5 oz)  Intake/Output Summary (Last 24 hours) at 08/27/16 0823 Last data filed at 08/27/16 0424  Gross per 24 hour  Intake             1380 ml  Output             1450 ml  Net              -70 ml    General: Alert, awake, oriented x3, in moderately acute distress due to pain. HEENT: [place]/AT PEERL, EOMI, anicteric Neck: Trachea midline,  no masses, no thyromegal,y no JVD, no carotid bruit OROPHARYNX:  Moist, No exudate/ erythema/lesions.  Heart: Regular rate and rhythm, without murmurs, rubs, gallops, PMI non-displaced, no heaves or thrills on palpation.  Lungs: Clear to auscultation, no wheezing or rhonchi noted. No increased vocal fremitus resonant to percussion.  Abdomen: Soft, nontender, nondistended, positive bowel sounds, no masses no hepatosplenomegaly  noted. Neuro: No focal neurological deficits noted cranial nerves II through XII grossly intact.  Strength at functional baseline in bilateral upper and lower extremities. Musculoskeletal: No warmth swelling or erythema around joints, no spinal tenderness noted. Psychiatric: Patient alert and oriented x3, good insight and cognition, good recent to remote recall.    Data Reviewed: Basic Metabolic Panel:  Recent Labs Lab 08/20/16 1336 08/24/16 0635 08/25/16 0658 08/26/16 1207  NA 139 134* 139 136  K 3.7 2.7* 4.1 3.6  CL 109 108 106 103  CO2 24 22 27 25   GLUCOSE 128* 409* 92 123*  BUN 9 5* 9 8  CREATININE 0.48 1.02* 0.66 0.53  CALCIUM 9.0 6.8* 8.7* 8.7*  MG  --  1.3*  --   --     Liver Function Tests:  Recent Labs Lab 08/20/16 1336 08/26/16 1207  AST 47* 38  ALT 46 68*  ALKPHOS 65 56  BILITOT 2.7* 2.4*  PROT 7.7 7.2  ALBUMIN 4.5 3.9    Recent Labs Lab 08/20/16 1830  LIPASE 27   No results for input(s): AMMONIA in the last 168 hours. CBC:  Recent Labs Lab 08/20/16 1336 08/24/16 0635 08/25/16 0658 08/26/16 1207  WBC 18.9* 7.9 11.0* 16.1*  NEUTROABS 17.3* 4.4 5.6 11.5*  HGB 10.3* 6.6* 8.0* 8.5*  HCT 29.1* 18.7* 23.6* 23.9*  MCV 87.9 86.6 86.8 87.2  PLT 505* 370 447* 477*   Cardiac Enzymes: No results for input(s): CKTOTAL, CKMB, CKMBINDEX, TROPONINI in the last 168 hours. BNP (last 3 results) No results for input(s): BNP in the last 8760 hours.  ProBNP (last 3 results) No results for input(s): PROBNP in the last 8760 hours.  CBG: No results for input(s): GLUCAP in the last 168 hours.  No results found for this or any previous visit (from the past 240 hour(s)).   Studies: No results found.  Scheduled Meds: . HYDROmorphone   Intravenous Q4H  . hydroxyurea  1,000 mg Oral Daily  . L-glutamine  15 g Oral BID  . morphine  60 mg Oral Q12H  . oxyCODONE  15 mg Oral Q4H  . rivaroxaban  20 mg Oral Q breakfast  . senna-docusate  1 tablet Oral BID  . valACYclovir  1,000 mg Oral Daily   Continuous Infusions:   Active Problems:   Hypokalemia   Anemia of chronic disease   Leukocytosis   History of DVT (deep vein thrombosis)   Hb-SS disease with crisis ([place])   Acute kidney injury ([place])   In excess of 25 minutes spent during this visit. Greater than 50% involved face to face contact with the patient for assessment, counseling and coordination of care.    3:22 pm Nurse reports patient crying out stating worst pain in her arm that she has ever had. HR in 140's.   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 Will schedule Dilaudid every 2 hours in addition to her PCA and continue Toradol. Also will obtain a 12 lead-EKG.    GARBA,LAWAL

## 2016-08-28 DIAGNOSIS — E860 Dehydration: Secondary | ICD-10-CM | POA: Diagnosis not present

## 2016-08-28 DIAGNOSIS — R112 Nausea with vomiting, unspecified: Secondary | ICD-10-CM | POA: Diagnosis not present

## 2016-08-28 DIAGNOSIS — N179 Acute kidney failure, unspecified: Secondary | ICD-10-CM | POA: Diagnosis not present

## 2016-08-28 DIAGNOSIS — K529 Noninfective gastroenteritis and colitis, unspecified: Secondary | ICD-10-CM | POA: Diagnosis not present

## 2016-08-28 DIAGNOSIS — R000 Tachycardia, unspecified: Secondary | ICD-10-CM | POA: Diagnosis not present

## 2016-08-28 DIAGNOSIS — E876 Hypokalemia: Secondary | ICD-10-CM | POA: Diagnosis not present

## 2016-08-28 DIAGNOSIS — D5700 Hb-SS disease with crisis, unspecified: Secondary | ICD-10-CM | POA: Diagnosis not present

## 2016-08-28 LAB — CBC WITH DIFFERENTIAL/PLATELET
Basophils Absolute: 0 10*3/uL (ref 0.0–0.1)
Basophils Relative: 0 %
Eosinophils Absolute: 0.7 10*3/uL (ref 0.0–0.7)
Eosinophils Relative: 6 %
HEMATOCRIT: 23.4 % — AB (ref 36.0–46.0)
HEMOGLOBIN: 8.2 g/dL — AB (ref 12.0–15.0)
LYMPHS ABS: 2.4 10*3/uL (ref 0.7–4.0)
LYMPHS PCT: 21 %
MCH: 30.5 pg (ref 26.0–34.0)
MCHC: 35 g/dL (ref 30.0–36.0)
MCV: 87 fL (ref 78.0–100.0)
MONOS PCT: 14 %
Monocytes Absolute: 1.6 10*3/uL — ABNORMAL HIGH (ref 0.1–1.0)
NEUTROS ABS: 6.7 10*3/uL (ref 1.7–7.7)
NEUTROS PCT: 59 %
Platelets: 492 10*3/uL — ABNORMAL HIGH (ref 150–400)
RBC: 2.69 MIL/uL — AB (ref 3.87–5.11)
RDW: 17.1 % — ABNORMAL HIGH (ref 11.5–15.5)
WBC: 11.4 10*3/uL — AB (ref 4.0–10.5)

## 2016-08-28 LAB — COMPREHENSIVE METABOLIC PANEL
ALK PHOS: 62 U/L (ref 38–126)
ALT: 49 U/L (ref 14–54)
AST: 21 U/L (ref 15–41)
Albumin: 3.7 g/dL (ref 3.5–5.0)
Anion gap: 6 (ref 5–15)
BILIRUBIN TOTAL: 1.8 mg/dL — AB (ref 0.3–1.2)
BUN: 10 mg/dL (ref 6–20)
CALCIUM: 8.8 mg/dL — AB (ref 8.9–10.3)
CO2: 27 mmol/L (ref 22–32)
CREATININE: 0.56 mg/dL (ref 0.44–1.00)
Chloride: 103 mmol/L (ref 101–111)
GFR calc non Af Amer: >60 mL/min (ref >60)
GLUCOSE: 98 mg/dL (ref 65–99)
Potassium: 4 mmol/L (ref 3.5–5.1)
Sodium: 136 mmol/L (ref 135–145)
TOTAL PROTEIN: 6.9 g/dL (ref 6.5–8.1)

## 2016-08-28 MED ORDER — HYDROMORPHONE HCL 2 MG/ML IJ SOLN
2.0000 mg | INTRAMUSCULAR | Status: DC | PRN
  Administered 2016-08-28 – 2016-08-29 (×7): 2 mg via INTRAVENOUS
  Filled 2016-08-28 (×7): qty 1

## 2016-08-28 MED ORDER — HYDROMORPHONE HCL 2 MG/ML IJ SOLN
1.0000 mg | Freq: Once | INTRAMUSCULAR | Status: AC
  Administered 2016-08-28: 1 mg via INTRAVENOUS
  Filled 2016-08-28: qty 1

## 2016-08-28 NOTE — Progress Notes (Signed)
SICKLE CELL SERVICE PROGRESS NOTE  [name] CVE:[id number] DOB: 03/29/1991 DOA: 08/20/2016 PCP: Ricke Hey, MD  Assessment/Plan: Active Problems:   Hypokalemia   Anemia of chronic disease   Leukocytosis   History of DVT (deep vein thrombosis)   Hb-SS disease with crisis ([place])   Acute kidney injury ([employer])  1. Hb SS with crisis: Pt still complaining of 10 out of 10 pain especially in her arms. Patient has had Dilaudid PCA and physician assisted dosing which has been discontinued.  She has used 48 mg of the Dilaudid was 66 demands and 65 deliveries. At this point I'll continue with the Dilaudid PCA Toradol. I will restart  bolus doses for next 24 hours. Continue Endari. 2. Acute Kidney Injury: Resolved. 3. Hypokalemia: Resolved  4. Leukocytosis: Likely secondary to crisis. No evidence of infection.  5. Anemia of chronic Disease: Hb back at baseline without any intervention. Continue Hydrea. 6. Chronic pain: Continue MS Contin 7. Chronic Anticoagulation: Pt had recent acute DVT and is on Xarelto 20 mg daily.   Code Status: Full Code Family Communication: N/A Disposition Plan:Not ready fro discharge today.   [facility]  Pager [phone]. If 7PM-7AM, please contact night-coverage.  08/28/2016, 8:27 AM  LOS: 8 days  Interim History: Received a call from patients nurse this morning stating that patient was crying and reporting an increase in pain to a new location (right shoulder) at an intensity of 10/10. Medication given and patient still c/o pain 10/10 and now down RUE and left knee. Pt reports that she had a sudden onset of throbbing pain to the right shoulder starting about 5:30 this morning that was severe enough to awaken her from sleep. Currently she is still c/o sever pain in RUE at an intensity of 10/10. She denies any other symptoms and except for pain in the location of left knee. Pt has maintained her appetite. She has  used 42.96 mg of Dilaudid in the last 24 hours  with 85/82:demands/deliveries.    Consultants:  None   Procedures:  None  Antibiotics:   None   Objective: Vitals:   08/27/16 2355 08/28/16 0019 08/28/16 0400 08/28/16 0503  BP:  (!) 141/87  (!) 125/92  Pulse:  97  (!) 107  Resp: 17 18 14 12   Temp:  99.4 F (37.4 C)  98.6 F (37 C)  TempSrc:  Oral  Oral  SpO2: 99% 99% 95% 98%  Weight:    88.7 kg (195 lb 8.8 oz)  Height:       Weight change: 4.7 kg (10 lb 5.8 oz)  Intake/Output Summary (Last 24 hours) at 08/28/16 0827 Last data filed at 08/28/16 0505  Gross per 24 hour  Intake             1240 ml  Output             3200 ml  Net            -1960 ml    General: Alert, awake, oriented x3, in moderately acute distress due to pain. HEENT: [place]/AT PEERL, EOMI, anicteric Neck: Trachea midline,  no masses, no thyromegal,y no JVD, no carotid bruit OROPHARYNX:  Moist, No exudate/ erythema/lesions.  Heart: Regular rate and rhythm, without murmurs, rubs, gallops, PMI non-displaced, no heaves or thrills on palpation.  Lungs: Clear to auscultation, no wheezing or rhonchi noted. No increased vocal fremitus resonant to percussion.  Abdomen: Soft, nontender, nondistended, positive bowel sounds, no masses no hepatosplenomegaly noted. Neuro: No focal neurological  deficits noted cranial nerves II through XII grossly intact.  Strength at functional baseline in bilateral upper and lower extremities. Musculoskeletal: No warmth swelling or erythema around joints, no spinal tenderness noted. Psychiatric: Patient alert and oriented x3, good insight and cognition, good recent to remote recall.    Data Reviewed: Basic Metabolic Panel:  Recent Labs Lab 08/24/16 0635 08/25/16 0658 08/26/16 1207  NA 134* 139 136  K 2.7* 4.1 3.6  CL 108 106 103  CO2 22 27 25   GLUCOSE 409* 92 123*  BUN 5* 9 8  CREATININE 1.02* 0.66 0.53  CALCIUM 6.8* 8.7* 8.7*  MG 1.3*  --   --    Liver Function Tests:  Recent Labs Lab 08/26/16 1207  AST 38   ALT 68*  ALKPHOS 56  BILITOT 2.4*  PROT 7.2  ALBUMIN 3.9   No results for input(s): LIPASE, AMYLASE in the last 168 hours. No results for input(s): AMMONIA in the last 168 hours. CBC:  Recent Labs Lab 08/24/16 0635 08/25/16 0658 08/26/16 1207  WBC 7.9 11.0* 16.1*  NEUTROABS 4.4 5.6 11.5*  HGB 6.6* 8.0* 8.5*  HCT 18.7* 23.6* 23.9*  MCV 86.6 86.8 87.2  PLT 370 447* 477*   Cardiac Enzymes: No results for input(s): CKTOTAL, CKMB, CKMBINDEX, TROPONINI in the last 168 hours. BNP (last 3 results) No results for input(s): BNP in the last 8760 hours.  ProBNP (last 3 results) No results for input(s): PROBNP in the last 8760 hours.  CBG: No results for input(s): GLUCAP in the last 168 hours.  No results found for this or any previous visit (from the past 240 hour(s)).   Studies: No results found.  Scheduled Meds: . HYDROmorphone   Intravenous Q4H  . hydroxyurea  1,000 mg Oral Daily  . L-glutamine  15 g Oral BID  . morphine  60 mg Oral Q12H  . oxyCODONE  15 mg Oral Q4H  . rivaroxaban  20 mg Oral Q breakfast  . senna-docusate  1 tablet Oral BID  . valACYclovir  1,000 mg Oral Daily   Continuous Infusions:   Active Problems:   Hypokalemia   Anemia of chronic disease   Leukocytosis   History of DVT (deep vein thrombosis)   Hb-SS disease with crisis ([place])   Acute kidney injury ([place])   In excess of 25 minutes spent during this visit. Greater than 50% involved face to face contact with the patient for assessment, counseling and coordination of care.    3:22 pm Nurse reports patient crying out stating worst pain in her arm that she has ever had. HR in 140's.   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 Will schedule Dilaudid every 2 hours in addition to her PCA and continue Toradol. Also will obtain a 12 lead-EKG.   Consuelo Thayne,LAWAL

## 2016-08-28 NOTE — Progress Notes (Addendum)
At 0230, pt sobbing again with 10/10 pain in arms. Had used 12mg  (almost all of possible dose) of PCA previous 4 hour period. Paged on call MD and got another order for dilaudid 1mg  x1. Continue to monitor. Hortencia Conradi RN

## 2016-08-29 DIAGNOSIS — Z86718 Personal history of other venous thrombosis and embolism: Secondary | ICD-10-CM | POA: Diagnosis not present

## 2016-08-29 DIAGNOSIS — R000 Tachycardia, unspecified: Secondary | ICD-10-CM | POA: Diagnosis not present

## 2016-08-29 DIAGNOSIS — K529 Noninfective gastroenteritis and colitis, unspecified: Secondary | ICD-10-CM | POA: Diagnosis not present

## 2016-08-29 DIAGNOSIS — E860 Dehydration: Secondary | ICD-10-CM | POA: Diagnosis not present

## 2016-08-29 DIAGNOSIS — E876 Hypokalemia: Secondary | ICD-10-CM | POA: Diagnosis not present

## 2016-08-29 DIAGNOSIS — R112 Nausea with vomiting, unspecified: Secondary | ICD-10-CM | POA: Diagnosis not present

## 2016-08-29 DIAGNOSIS — D5700 Hb-SS disease with crisis, unspecified: Secondary | ICD-10-CM | POA: Diagnosis not present

## 2016-08-29 DIAGNOSIS — D638 Anemia in other chronic diseases classified elsewhere: Secondary | ICD-10-CM | POA: Diagnosis not present

## 2016-08-29 DIAGNOSIS — D72829 Elevated white blood cell count, unspecified: Secondary | ICD-10-CM | POA: Diagnosis not present

## 2016-08-29 DIAGNOSIS — N179 Acute kidney failure, unspecified: Secondary | ICD-10-CM | POA: Diagnosis not present

## 2016-08-29 MED ORDER — KETOROLAC TROMETHAMINE 30 MG/ML IJ SOLN
30.0000 mg | Freq: Once | INTRAMUSCULAR | Status: AC
  Administered 2016-08-29: 30 mg via INTRAVENOUS
  Filled 2016-08-29: qty 1

## 2016-08-29 NOTE — Progress Notes (Signed)
SICKLE CELL SERVICE PROGRESS NOTE  [name] KDT:[id number] DOB: Feb 01, 1991 DOA: 08/20/2016 PCP: Ricke Hey, MD  Assessment/Plan: Active Problems:   Hypokalemia   Anemia of chronic disease   Leukocytosis   History of DVT (deep vein thrombosis)   Hb-SS disease with crisis ([place])   Acute kidney injury ([place])  1. Hb SS with crisis: Pt's pain significantly Improved today. Will wean PCA in anticipation of discharge tomorrow. 2. Tachycardia: Pt has tachycardia up into the 130's with ambulation. I suspect this is a reflection of her pain. Will continue to treat for at least one more day and anticipate discharge home tomorrow.  3. Acute Kidney Injury: Resolved. It is likely that the lab results from yesterday were in accurate. 4. Hypokalemia: Resolved with oral replacement.  5. Leukocytosis: Likely secondary to crisis. No evidence of infection.  6. Anemia of chronic Disease: Hb back at baseline without any intervention. Continue Hydrea. 7. Chronic pain: Continue MS Contin 8. Chronic Anticoagulation: Pt had recent acute DVT and is on Xarelto 20 mg daily.   Code Status: Full Code Family Communication: N/A Disposition Plan:Anticipate discharge tomorrow.   Dai Apel A.  Pager [phone]. If 7PM-7AM, please contact night-coverage.  08/29/2016, 1:37 PM  LOS: 9 days  Interim History: Pt reports that pain has in the last 2 hours come down to 6/10 but was up to 7/10 earlier today. She has used 37.8 mg of Dilaudidwith 64/63:demands/deliveries in the last 24 hours. Pt lad a BM yesterday.   Consultants:  None   Procedures:  None  Antibiotics:   None   Objective: Vitals:   08/29/16 0731 08/29/16 1042 08/29/16 1200 08/29/16 1251  BP:  (!) 103/53  118/62  Pulse:  92  94  Resp: 14 16 14 16   Temp:  98.9 F (37.2 C)  99.1 F (37.3 C)  TempSrc:  Oral  Oral  SpO2: 100% 99% 97% 95%  Weight:      Height:       Weight change: 0.5 kg (1 lb 1.6 oz)  Intake/Output Summary  (Last 24 hours) at 08/29/16 1337 Last data filed at 08/29/16 0514  Gross per 24 hour  Intake              360 ml  Output                0 ml  Net              360 ml    General: Alert, awake, oriented x3, in no apparent distress. HEENT: [place]/AT PEERL, EOMI, anicteric Neck: Trachea midline,  no masses, no thyromegal,y no JVD, no carotid bruit OROPHARYNX:  Moist, No exudate/ erythema/lesions.  Heart: Regular rate and rhythm, without murmurs, rubs, gallops, PMI non-displaced, no heaves or thrills on palpation.  Lungs: Clear to auscultation, no wheezing or rhonchi noted. No increased vocal fremitus resonant to percussion.  Abdomen: Soft, nontender, nondistended, positive bowel sounds, no masses no hepatosplenomegaly noted. Neuro: No focal neurological deficits noted cranial nerves II through XII grossly intact.  Strength at functional baseline in bilateral upper and lower extremities. Musculoskeletal: No warmth swelling or erythema around joints, no spinal tenderness noted. Psychiatric: Patient alert and oriented x3, good insight and cognition, good recent to remote recall.    Data Reviewed: Basic Metabolic Panel:  Recent Labs Lab 08/24/16 0635 08/25/16 0658 08/26/16 1207 08/28/16 0820  NA 134* 139 136 136  K 2.7* 4.1 3.6 4.0  CL 108 106 103 103  CO2 22 27  25 27  GLUCOSE 409* 92 123* 98  BUN 5* 9 8 10   CREATININE 1.02* 0.66 0.53 0.56  CALCIUM 6.8* 8.7* 8.7* 8.8*  MG 1.3*  --   --   --    Liver Function Tests:  Recent Labs Lab 08/26/16 1207 08/28/16 0820  AST 38 21  ALT 68* 49  ALKPHOS 56 62  BILITOT 2.4* 1.8*  PROT 7.2 6.9  ALBUMIN 3.9 3.7   No results for input(s): LIPASE, AMYLASE in the last 168 hours. No results for input(s): AMMONIA in the last 168 hours. CBC:  Recent Labs Lab 08/24/16 0635 08/25/16 0658 08/26/16 1207 08/28/16 0820  WBC 7.9 11.0* 16.1* 11.4*  NEUTROABS 4.4 5.6 11.5* 6.7  HGB 6.6* 8.0* 8.5* 8.2*  HCT 18.7* 23.6* 23.9* 23.4*  MCV 86.6  86.8 87.2 87.0  PLT 370 447* 477* 492*   Cardiac Enzymes: No results for input(s): CKTOTAL, CKMB, CKMBINDEX, TROPONINI in the last 168 hours. BNP (last 3 results) No results for input(s): BNP in the last 8760 hours.  ProBNP (last 3 results) No results for input(s): PROBNP in the last 8760 hours.  CBG: No results for input(s): GLUCAP in the last 168 hours.  No results found for this or any previous visit (from the past 240 hour(s)).   Studies: No results found.  Scheduled Meds: . HYDROmorphone   Intravenous Q4H  . hydroxyurea  1,000 mg Oral Daily  . L-glutamine  15 g Oral BID  . morphine  60 mg Oral Q12H  . oxyCODONE  15 mg Oral Q4H  . rivaroxaban  20 mg Oral Q breakfast  . senna-docusate  1 tablet Oral BID  . valACYclovir  1,000 mg Oral Daily   Continuous Infusions:   Active Problems:   Hypokalemia   Anemia of chronic disease   Leukocytosis   History of DVT (deep vein thrombosis)   Hb-SS disease with crisis ([place])   Acute kidney injury ([place])   In excess of 25 minutes spent during this visit. Greater than 50% involved face to face contact with the patient for assessment, counseling and coordination of care.    Kayia Billinger A.

## 2016-08-30 DIAGNOSIS — D5700 Hb-SS disease with crisis, unspecified: Secondary | ICD-10-CM | POA: Diagnosis not present

## 2016-08-30 DIAGNOSIS — Z86718 Personal history of other venous thrombosis and embolism: Secondary | ICD-10-CM | POA: Diagnosis not present

## 2016-08-30 IMAGING — XA IR FLUORO GUIDE CV LINE*R*
1 series · 1 of 1 positions shown · non-contrast
Comparison: none

CLINICAL DATA: Sickle cell anemia with crisis during pregnancy.
Poor IV access and need for PICC line.

[Series 300: line placements · 1 of 1 slices shown]
[im 1/1]
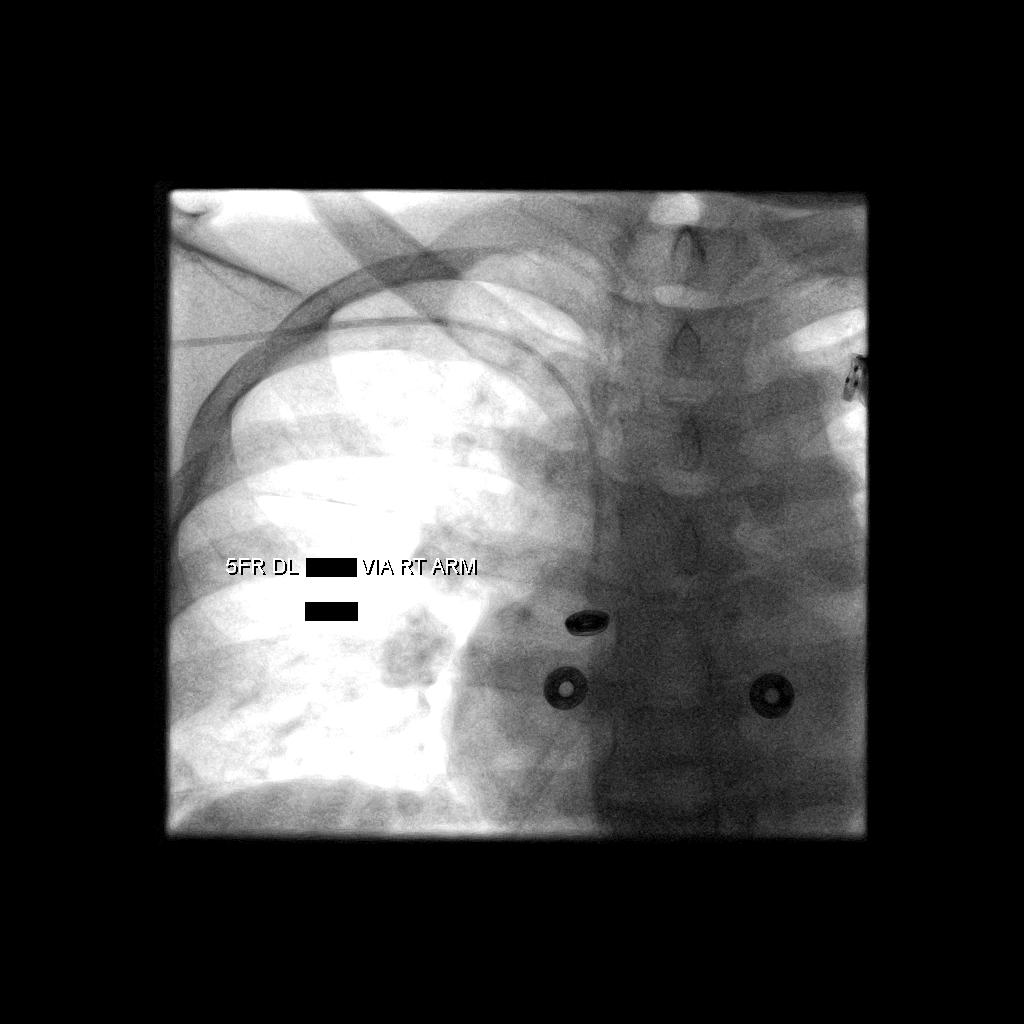

[1 of 1 positions shown; findings below may reference images not displayed]

EXAM:
POWER PICC LINE PLACEMENT WITH ULTRASOUND AND FLUOROSCOPIC GUIDANCE

FLUOROSCOPY TIME:  54 seconds.

PROCEDURE:
The patient was advised of the possible risks and complications and
agreed to undergo the procedure. The patient was then brought to the
angiographic suite for the procedure.

The right arm was prepped with chlorhexidine, draped in the usual
sterile fashion using maximum barrier technique (cap and mask,
sterile gown, sterile gloves, large sterile sheet, hand hygiene and
cutaneous antisepsis) and infiltrated locally with 1% Lidocaine.

Ultrasound demonstrated patency of the right brachial vein, and this
was documented with an image. Under real-time ultrasound guidance,
this vein was accessed with a 21 gauge micropuncture needle and
image documentation was performed. A [DATE] wire was introduced in to
the vein. Over this, a 5.0 French dual lumen power injectable PICC
was advanced to the lower SVC/right atrial junction. Fluoroscopy
during the procedure and fluoro spot radiograph confirms appropriate
catheter position. The catheter was flushed and covered with a
sterile dressing.

Catheter length: 33 cm

COMPLICATIONS:
None
IMPRESSION: Successful right arm power injectable PICC line placement with
ultrasound and fluoroscopic guidance. The catheter is ready for use.

## 2016-08-30 MED ORDER — HEPARIN SOD (PORK) LOCK FLUSH 100 UNIT/ML IV SOLN
500.0000 [IU] | INTRAVENOUS | Status: AC | PRN
  Administered 2016-08-30: 500 [IU]
  Filled 2016-08-30: qty 5

## 2016-08-30 MED ORDER — L-GLUTAMINE ORAL POWDER: 15.0000 g | PACK | Freq: Two times a day (BID) | ORAL | Status: DC

## 2016-08-30 NOTE — Progress Notes (Signed)
Discharge instructions given to patient,she signed and understood home meds. All questions answered appropriately. Port a cath flushed and deaacessed per protocol, site unremarkable.

## 2016-08-30 NOTE — Care Management Important Message (Signed)
Important Message  Patient Details  Name: ELLEANA STILLSON MRN: [id number] Date of Birth: Nov 22, 1990   Medicare Important Message Given:  Yes    Kerin Salen 08/30/2016, 9:54 AMImportant Message  Patient Details  Name: KAMESHA HERNE MRN: [id number] Date of Birth: August 15, 1990   Medicare Important Message Given:  Yes    Kerin Salen 08/30/2016, 9:54 AM

## 2016-08-30 NOTE — Progress Notes (Signed)
Patient d/c home. Stable. 

## 2016-08-30 NOTE — Discharge Summary (Signed)
[name] MRN: [id number] DOB/AGE: [birth year] 26 y.o.  Admit date: 08/20/2016 Discharge date: 08/30/2016  Primary Care Physician:  Ricke Hey, MD   Discharge Diagnoses:   Patient Active Problem List   Diagnosis Date Noted  . Major depression, chronic 01/06/2011    Priority: High  . Sickle cell disease ([name]) 01/08/2009    Priority: High  . Acute kidney injury ([place]) 08/25/2016  . 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 Chronic anticoagulation   . Sickle cell anemia ([place]) 05/19/2016  . History of DVT (deep vein thrombosis) 04/17/2016  . Episode of recurrent major depressive disorder ([place])   . Thrombocytosis ([place]) 11/09/2015  . Hyperbilirubinemia 11/09/2015  . Chronic pain 08/04/2015  . Herpes simplex 07/14/2015  . Anemia of chronic disease   . Leukocytosis   . Hypokalemia 10/29/2014  . Hb-SS disease without crisis ([place]) 02/26/2013  . 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    Latex Rash      Medication List    TAKE these medications   aspirin-acetaminophen-caffeine 250-250-65 MG tablet Commonly known as:  EXCEDRIN MIGRAINE Take 2 tablets by mouth every 6 (six) hours as needed for headache.   BAYER HEART HEALTH ADVANTAGE PO Take 2 tablets by mouth daily.   BIOTIN PO Take 2 tablets by mouth daily.   [employer] DAILY ENERGY Tabs Take 2 tablets by mouth daily.   hydroxyurea 500 MG capsule Commonly known as:  HYDREA Take 2 capsules (1,000 mg total) by mouth daily. May take with food to minimize GI side effects.   L-glutamine 5 g Pack Powder Packet Commonly known as:  ENDARI Take 15 g by mouth 2 (two) times daily.   morphine 60 MG 12 hr tablet Commonly known as:  MS CONTIN Take 60 mg by mouth every 12 (twelve) hours.   oxyCODONE 15 MG immediate release tablet Commonly known as:  ROXICODONE Take 15 mg by mouth  every 4 (four) hours as needed for pain.   rivaroxaban 20 MG Tabs tablet Commonly known as:  XARELTO Take 20 mg by mouth daily.   valACYclovir 1000 MG tablet Commonly known as:  VALTREX Take 1 tablet (1,000 mg total) by mouth daily.         Consults:    SIGNIFICANT DIAGNOSTIC STUDIES:  No results found.    No results found for this or any previous visit (from the past 240 hour(s)).  BRIEF ADMITTING H & P: This is an opiate tolerant patient with Hb SS who presented to the ED with c/o vomiting and diarrhea since yesterday. Pt states that she has had multiple episodes of loose stool and also emesis that is non bilious and non hematemetous in nature. Pt had 2 episodes of emesis and diarrhea in the ED.She denies any fevers, chills, SOB, cough, dysuria or vaginal discharge. She does admit to sick contacts who also have vomiting and diarrhea.  Pt reports that she awoke this morning with pain typical of her sickle cell crisis at a level of 10/10 and localized to her low back. She received a 1L bolus of 0.9 NS and 3 doses of Dilaudid in the ED and still her pain is 10 /10. She has continued emesis ad diarrhea and continue to have pain. I am asked to admit [name] for gastroenteritis and sickle cell crisis.   Hospital Course:  Present on Admission: . (Resolved) Sickle cell pain crisis ([place]) . Hb-SS disease with crisis ([place]) . Hypokalemia . Anemia of chronic disease . Leukocytosis Pt with Hb SS with admitted with crisis. She was treated with Dilaudid via PCA, Toradol and IVF and was doing well. However a change in weather triggered her going back into crisis and she had a re-escalation of pain which prolonged her hospitalization. However the pain improved and she was weaned off IV Opiates. She is discharged home in good condition. During her hospital course, she had labs that deviated from trend on one day . It was felt that is was an error and her labs returned to normal on the next day  and were in line with the trend of her lab data.   Disposition and Follow-up: Home to follow up with her PMD as scheduled.   Discharge Instructions    Activity as tolerated - No restrictions    Complete by:  As directed    Diet general    Complete by:  As directed       DISCHARGE EXAM:  General: Alert, awake, oriented x3, in no apparent distress.  HEENT: [place]/AT PEERL, EOMI, anicteric Neck: Trachea midline, no masses, no thyromegal,y no JVD, no carotid bruit OROPHARYNX: Moist, No exudate/ erythema/lesions.  Heart: Regular rate and rhythm, without murmurs, rubs, gallops or S3. PMI non-displaced. Exam reveals no decreased pulses. Pulmonary/Chest: Normal effort. Breath sounds normal. No. Apnea. Clear to auscultation,no stridor,  no wheezing and no rhonchi noted. No respiratory distress and no tenderness noted. Abdomen: Soft, nontender, nondistended, normal bowel sounds, no masses no hepatosplenomegaly noted. No fluid wave and no ascites. There is no guarding or rebound. Neuro: Alert and oriented to person, place and time. Normal motor skills, Displays no atrophy or tremors and exhibits normal muscle tone.  No focal neurological deficits noted cranial nerves II through XII grossly intact. No sensory deficit noted. Strength at baseline in bilateral upper and lower extremities. Gait normal. Musculoskeletal: No warm swelling or erythema around joints, no spinal tenderness noted. Psychiatric: Patient alert and oriented x3, good insight and cognition, good recent to remote recall. Lymph node survey: No cervical axillary or inguinal lymphadenopathy noted. Skin: Skin is warm and dry. No bruising, no ecchymosis and no rash noted. Pt is not diaphoretic. No erythema. No pallor    Blood pressure 91/68, pulse 99, temperature 98.5 F (36.9 C), temperature source Oral, resp. rate 18, height 5\' 1"  (1.549 m), weight 89 kg (196 lb 3.4 oz), SpO2 100 %, not currently breastfeeding.   Recent Labs   08/28/16 0820  NA 136  K 4.0  CL 103  CO2 27  GLUCOSE 98  BUN 10  CREATININE 0.56  CALCIUM 8.8*    Recent Labs  08/28/16 0820  AST 21  ALT 49  ALKPHOS 62  BILITOT 1.8*  PROT 6.9  ALBUMIN 3.7   No results for input(s): LIPASE, AMYLASE in the last 72 hours.  Recent Labs  08/28/16 0820  WBC 11.4*  NEUTROABS 6.7  HGB 8.2*  HCT 23.4*  MCV 87.0  PLT 492*     Total time spent including face to face and decision making was greater than 30 minutes  Signed: Rim Thatch A. 08/30/2016, 10:42 AM

## 2016-09-03 IMAGING — US US MFM FETAL BPP W/O NON-STRESS
1 series · 13 of 28 positions shown · non-contrast
Comparison: none

[Series 1: us mfm fetal bpp w/o non-stress · 0.20mm/px · 48 acquisitions, 13 frames shown]
[im 2/48]
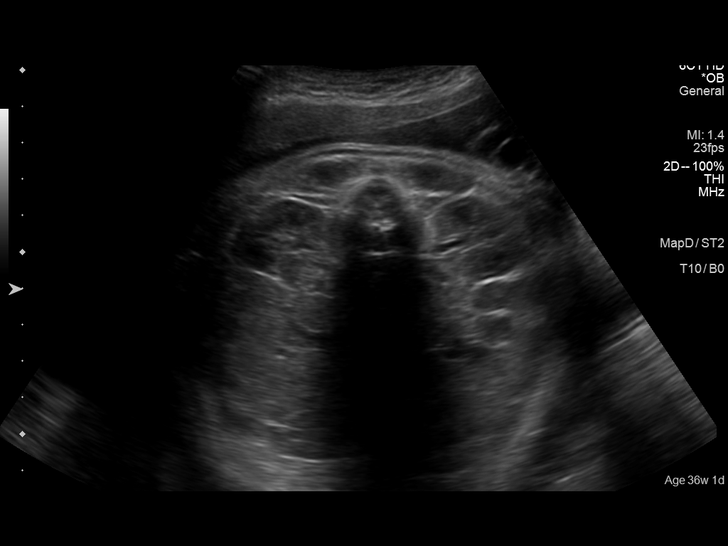
[im 6/48]
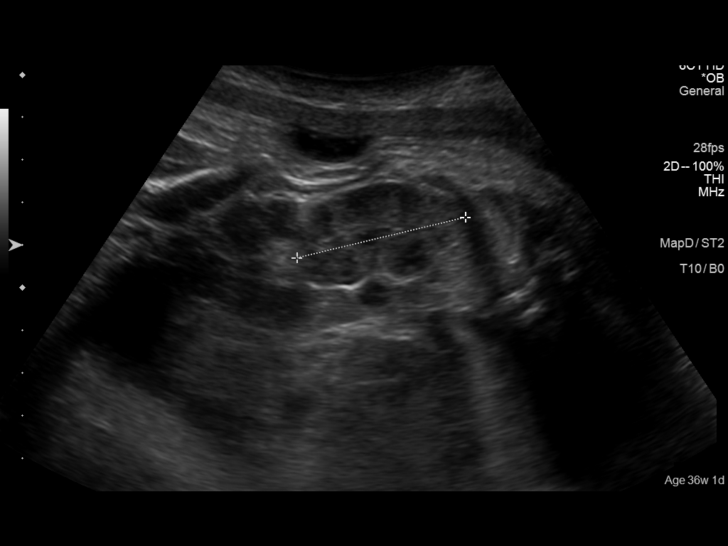
[im 9/48]
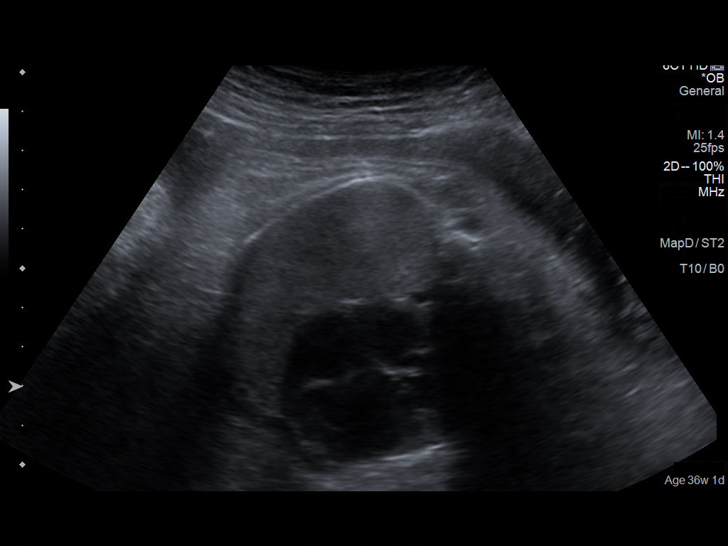
[im 13/48]
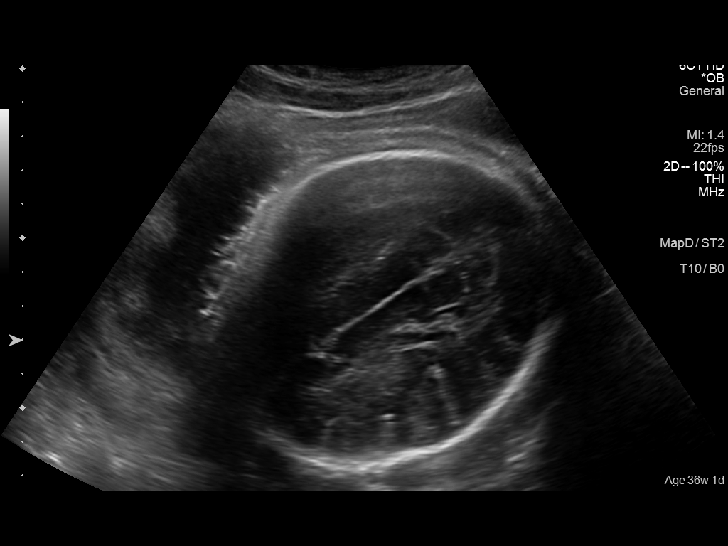
[im 16/48]
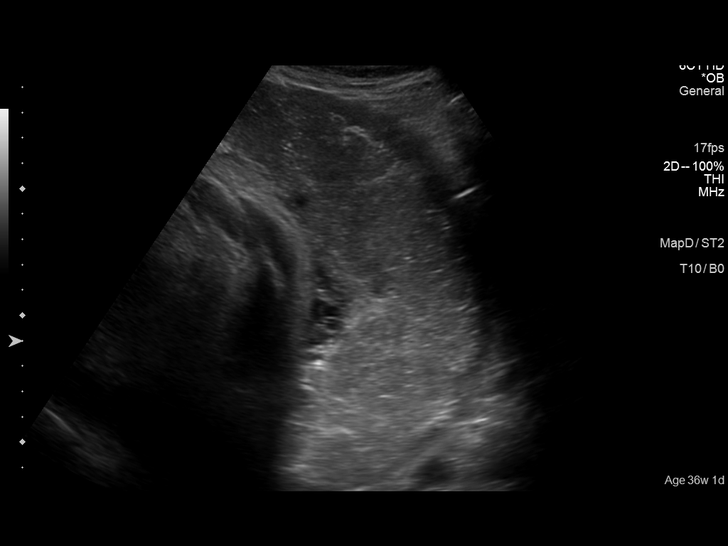
[im 20/48]
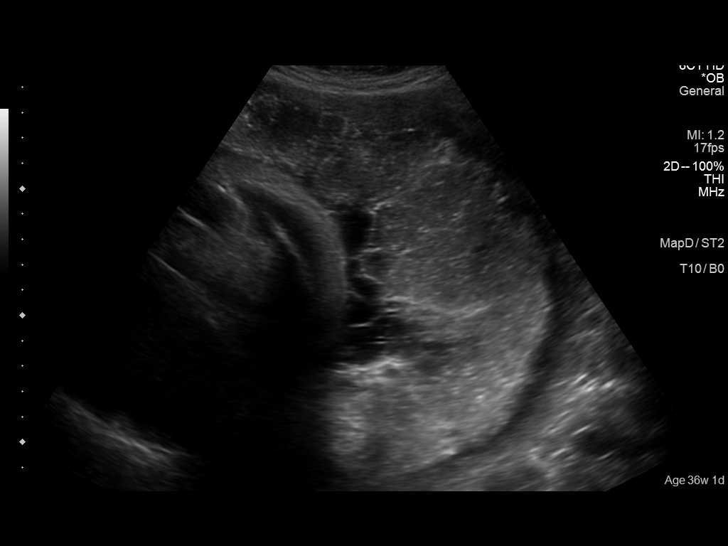
[im 25/48]
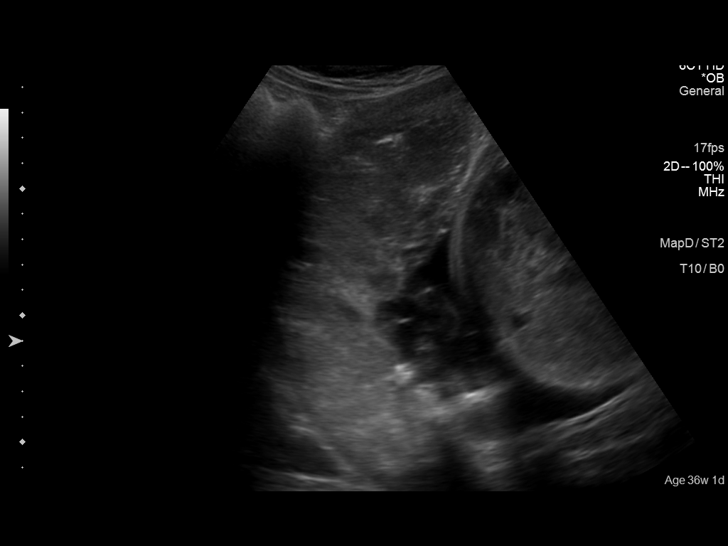
[im 28/48]
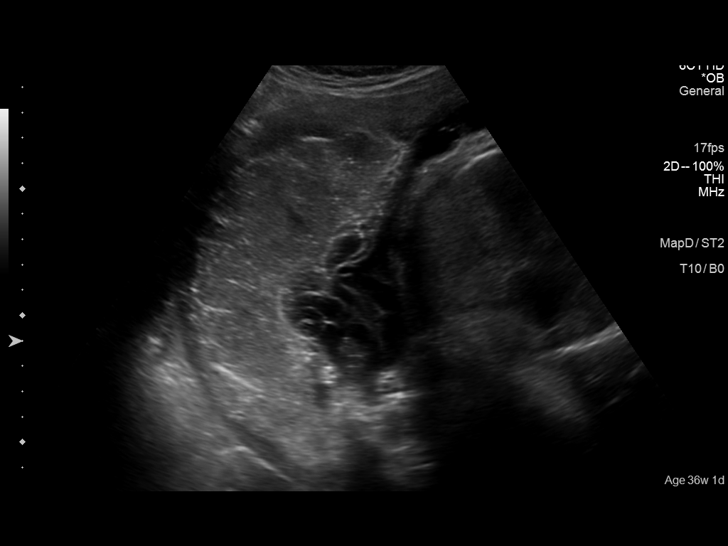
[im 32/48]
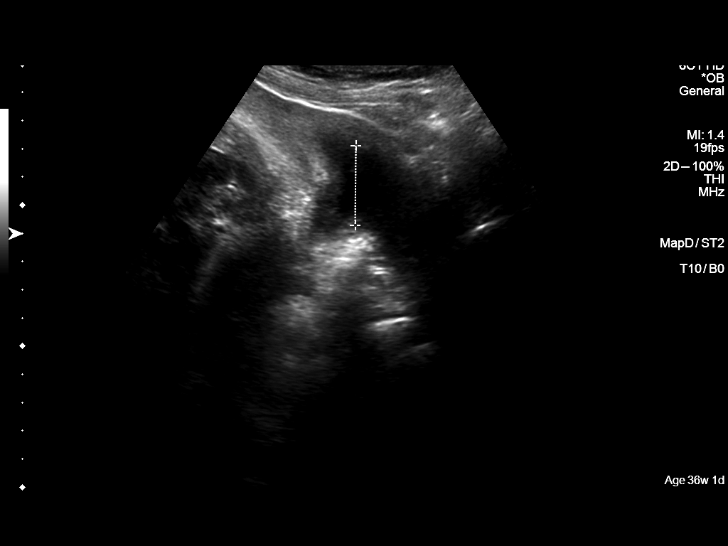
[im 35/48]
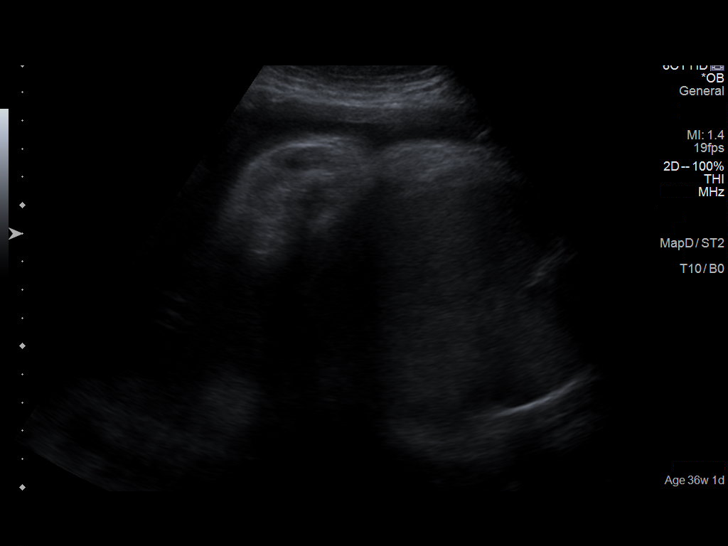
[im 39/48]
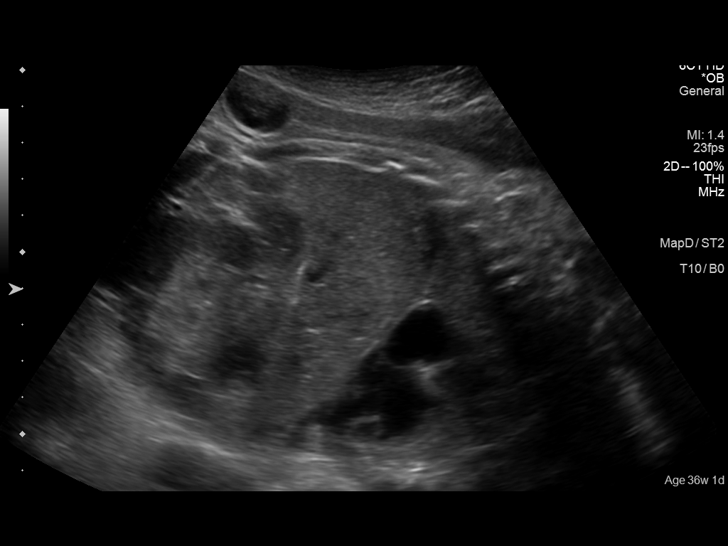
[im 42/48]
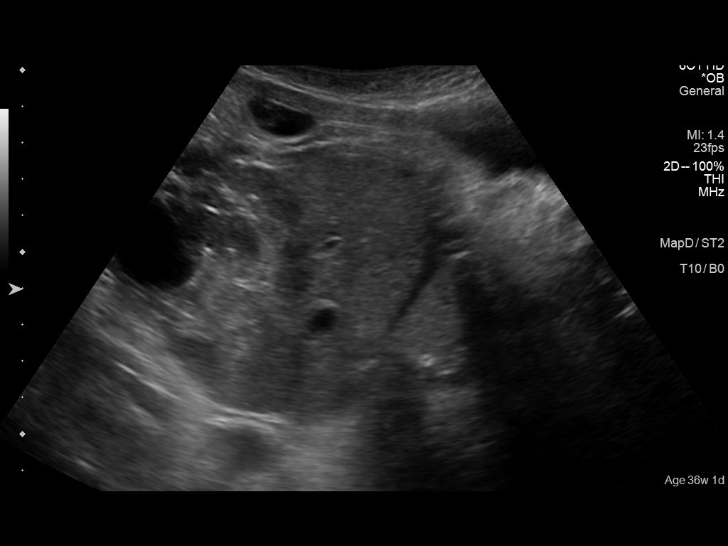
[im 46/48]
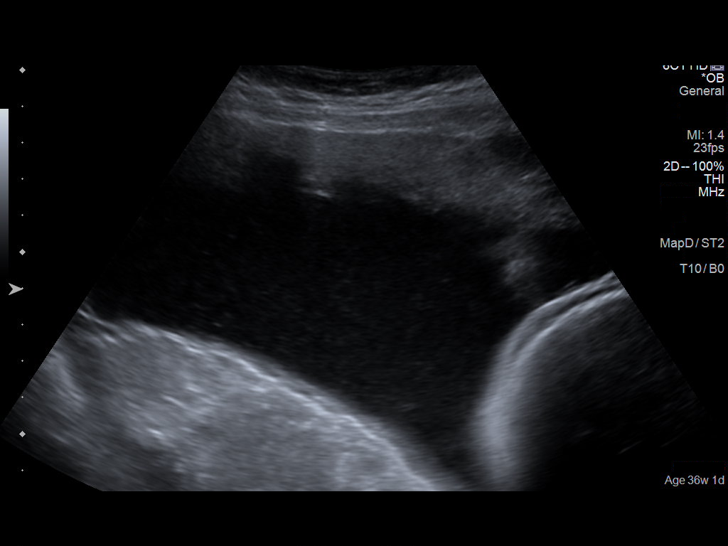

[13 of 28 positions shown; findings below may reference images not displayed]

OB/Gyn Clinic
[REDACTED]-
Faculty Physician

1  NATHALY HERING            [id number]      [id number]     [id number]
Indications

36 weeks gestation of pregnancy
Methadone use
Maternal sickle cell anemia, third trimester   O99.013,
Late to prenatal care, third trimester
OB History

Gravidity:    3         Term:   1        Prem:   0         SAB:   1
TOP:          0       Ectopic:  0        Living: 1
Fetal Evaluation

Num Of Fetuses:     1
Fetal Heart         137
Rate(bpm):
Cardiac Activity:   Observed
Presentation:       Cephalic
Placenta:           Posterior Fundal, above cervical os
P. Cord Insertion:  Previously Visualized

Amniotic Fluid
AFI FV:      Subjectively within normal limits

AFI Sum(cm)     %Tile       Largest Pocket(cm)
22.8            87

RUQ(cm)       RLQ(cm)       LUQ(cm)        LLQ(cm)
8.6
Biophysical Evaluation

Amniotic F.V:   Pocket => 2 cm two         F. Tone:         Observed
planes
F. Movement:    Observed                   Score:           [DATE]
F. Breathing:   Observed
Gestational Age

LMP:           36w 1d        Date:  02/09/15                 EDD:    11/16/15
Best:          36w 1d     Det. By:  LMP  (02/09/15)          EDD:    11/16/15
Anatomy

Ventricles:            Appears normal         Kidneys:                Appear normal
Stomach:               Appears normal, left   Bladder:                Appears normal
sided
Cervix Uterus Adnexa

Cervix
Not visualized (advanced GA >97wks)
Impression

SIUP at 36+1 weeks
Cepahalic presentation
Normal amniotic fluid volume
BPP [DATE]
Recommendations

Continue antenatal testing

## 2016-09-12 IMAGING — US US MFM FETAL BPP W/O NON-STRESS
1 series · 13 of 24 positions shown · non-contrast
Comparison: none

[Series 1: us mfm fetal bpp w/o non-stress · 24 acquisitions, 13 frames shown]
[im 1/24]
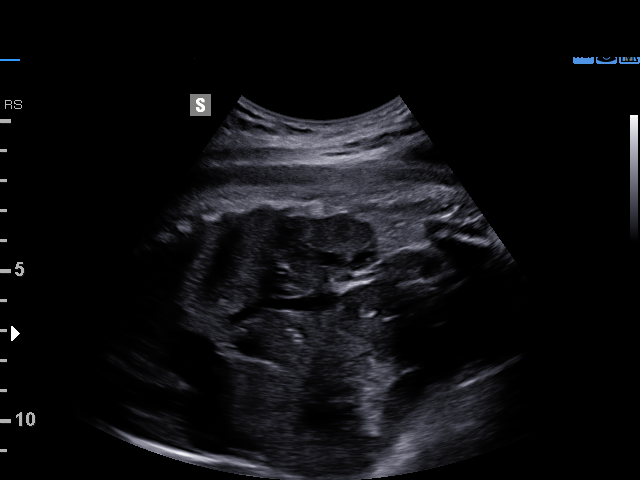
[im 3/24]
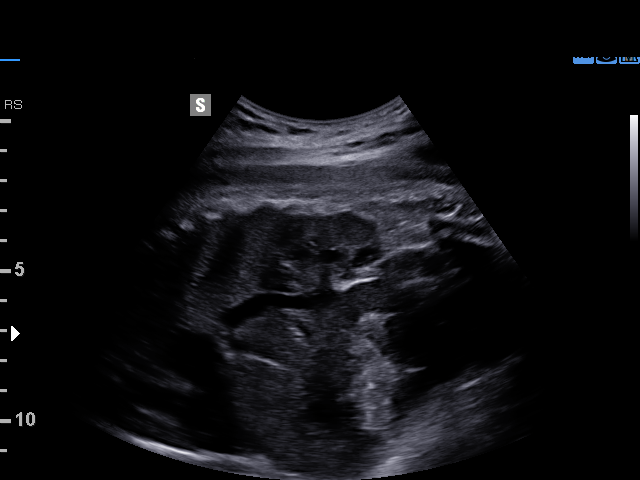
[im 5/24]
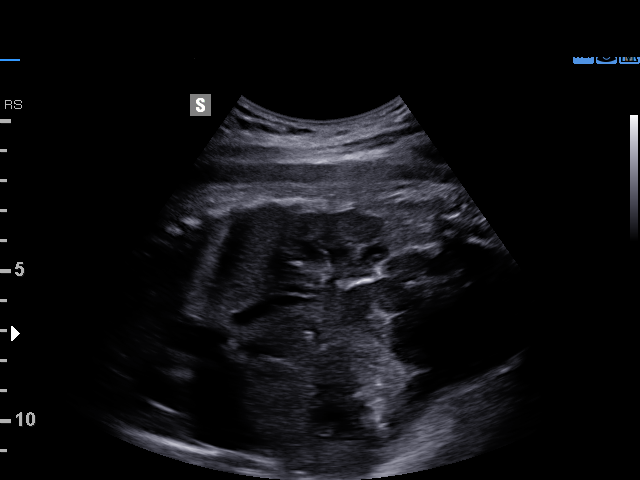
[im 7/24]
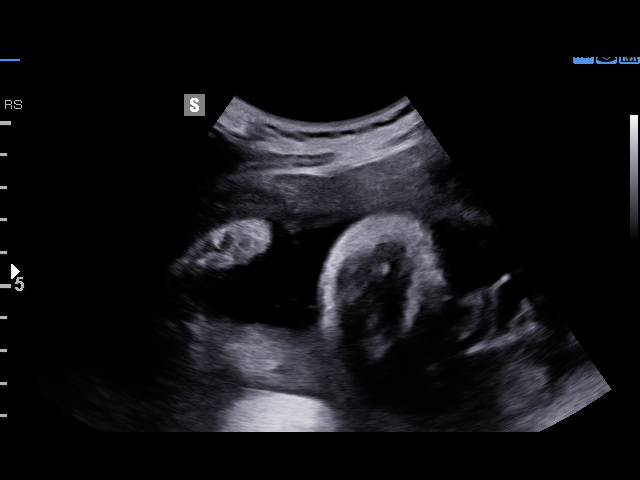
[im 9/24]
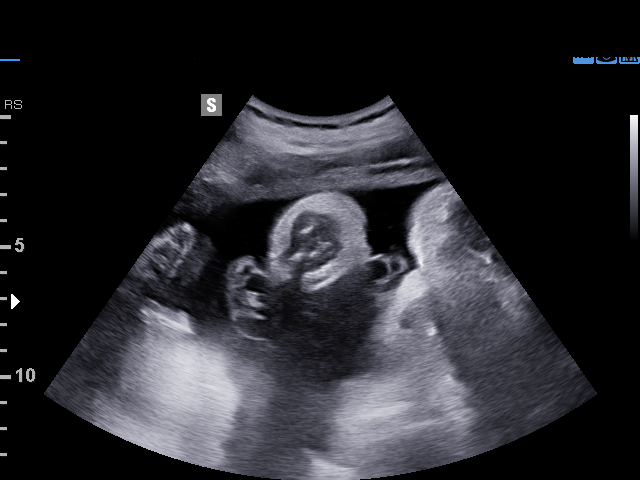
[im 11/24]
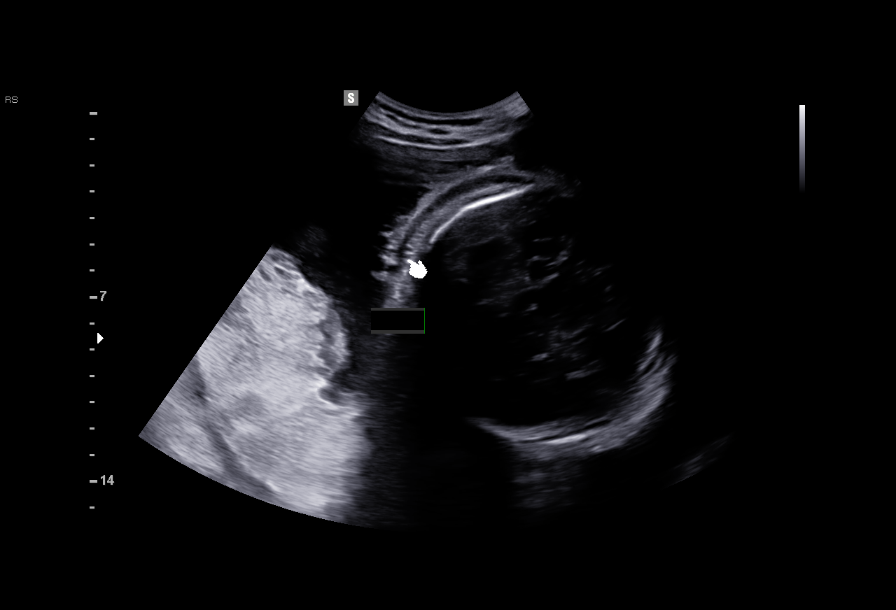
[im 13/24]
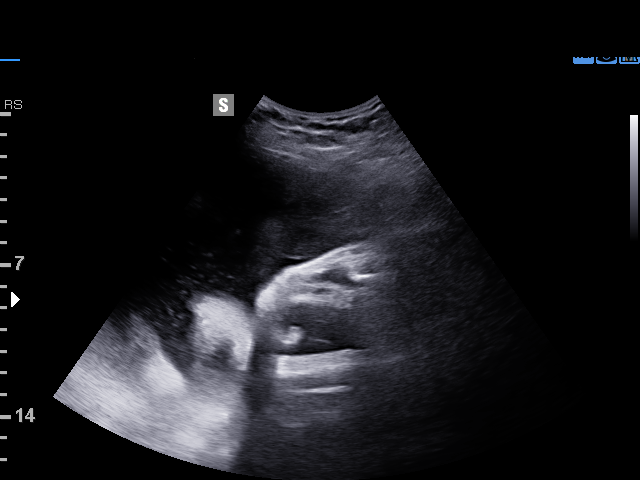
[im 14/24]
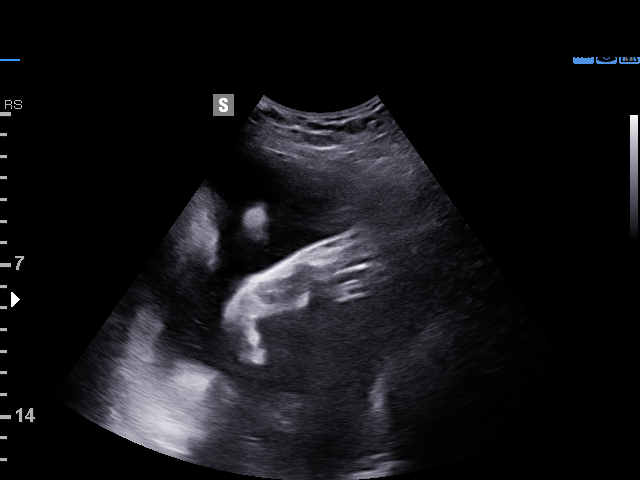
[im 16/24]
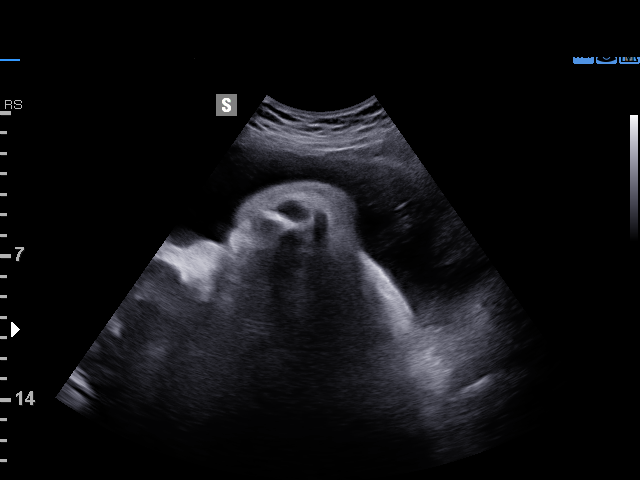
[im 18/24]
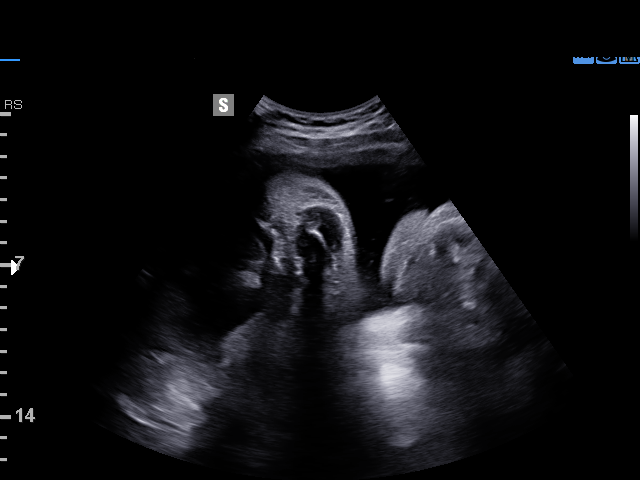
[im 20/24]
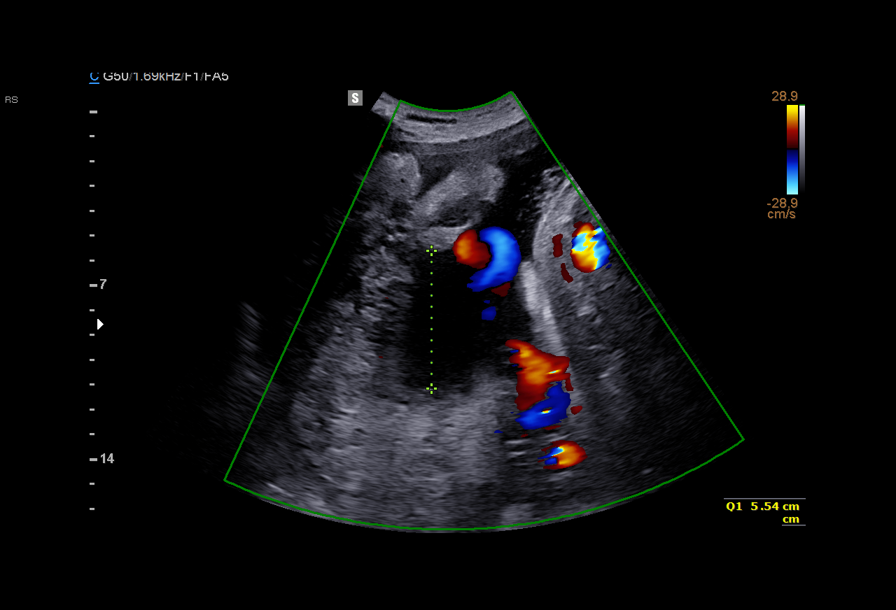
[im 22/24]
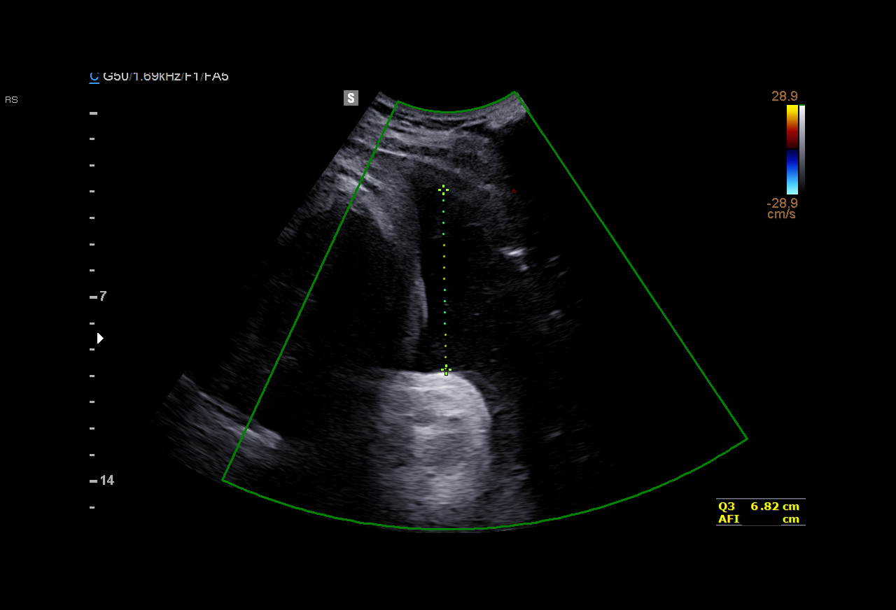
[im 24/24]
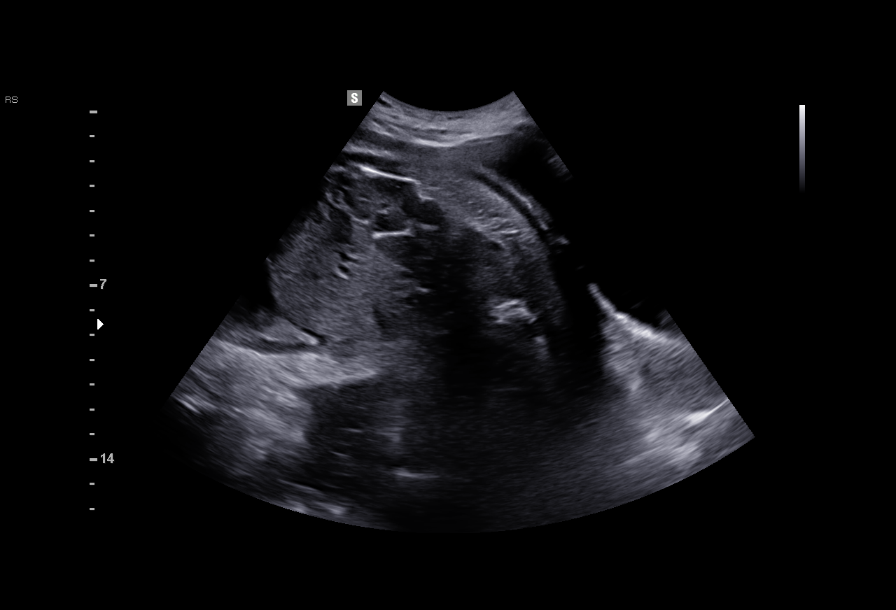

[13 of 24 positions shown; findings below may reference images not displayed]

OB/Gyn Clinic
Attending:        Bruhin Marmelo         Secondary Phy.:   3rd Nursing- 3rd
floor [id number]-[REDACTED]-
Faculty Physician

Indications

38 weeks gestation of pregnancy
Methadone use
Maternal sickle cell anemia, third trimester   O99.013,
OB History

Gravidity:    3         Term:   1        Prem:   0        SAB:   1
TOP:          0       Ectopic:  0        Living: 1
Fetal Evaluation

Num Of Fetuses:     1
Fetal Heart         146
Rate(bpm):
Cardiac Activity:   Observed
Presentation:       Breech/ oblique

Amniotic Fluid
AFI FV:      Polyhydramnios

AFI Sum(cm)     %Tile       Largest Pocket(cm)
25.84           > 97

RUQ(cm)       RLQ(cm)       LUQ(cm)        LLQ(cm)
5.54
Biophysical Evaluation

Amniotic F.V:   Pocket => 2 cm two         F. Tone:        Observed
planes
F. Movement:    Observed                   Score:          [DATE]
F. Breathing:   Observed
Gestational Age

LMP:           39w 5d       Date:   01/24/15                 EDD:   10/31/15
Best:          38w 6d    Det. By:   Early Ultrasound         EDD:   11/06/15
(05/02/15)
Impression

Singleton intrauterine pregnancy at 38 weeks 6 days
gestation with fetal cardiac activity
Breech/oblique presentation
BPP [DATE] with and AFI >25 cm
Polyhdramnios
Recommendations

Follow-up ultrasounds as clinically indicated.

## 2016-09-15 ENCOUNTER — Inpatient Hospital Stay (HOSPITAL_COMMUNITY)
Admission: AD | Admit: 2016-09-15 | Discharge: 2016-09-19 | DRG: 812 | Disposition: A | Discharge status: Home / Self Care | Payer: Medicare Other | Source: Ambulatory Visit | Attending: Internal Medicine | Admitting: Internal Medicine

## 2016-09-15 ENCOUNTER — Encounter (HOSPITAL_COMMUNITY): Payer: Self-pay | Admitting: Emergency Medicine

## 2016-09-15 ENCOUNTER — Encounter (HOSPITAL_COMMUNITY): Payer: Self-pay | Admitting: *Deleted

## 2016-09-15 ENCOUNTER — Emergency Department (HOSPITAL_COMMUNITY)
Admission: EM | Admit: 2016-09-15 | Discharge: 2016-09-15 | Disposition: A | Discharge status: Home / Self Care | Payer: Medicare Other | Source: Home / Self Care | Attending: Emergency Medicine | Admitting: Emergency Medicine

## 2016-09-15 DIAGNOSIS — Z91040 Latex allergy status: Secondary | ICD-10-CM

## 2016-09-15 DIAGNOSIS — M25561 Pain in right knee: Secondary | ICD-10-CM | POA: Diagnosis present

## 2016-09-15 DIAGNOSIS — Z79899 Other long term (current) drug therapy: Secondary | ICD-10-CM

## 2016-09-15 DIAGNOSIS — Z86718 Personal history of other venous thrombosis and embolism: Secondary | ICD-10-CM

## 2016-09-15 DIAGNOSIS — Z91018 Allergy to other foods: Secondary | ICD-10-CM

## 2016-09-15 DIAGNOSIS — Z7902 Long term (current) use of antithrombotics/antiplatelets: Secondary | ICD-10-CM

## 2016-09-15 DIAGNOSIS — K219 Gastro-esophageal reflux disease without esophagitis: Secondary | ICD-10-CM | POA: Diagnosis present

## 2016-09-15 DIAGNOSIS — Z7982 Long term (current) use of aspirin: Secondary | ICD-10-CM | POA: Insufficient documentation

## 2016-09-15 DIAGNOSIS — D5700 Hb-SS disease with crisis, unspecified: Secondary | ICD-10-CM | POA: Diagnosis not present

## 2016-09-15 DIAGNOSIS — Z7901 Long term (current) use of anticoagulants: Secondary | ICD-10-CM | POA: Diagnosis not present

## 2016-09-15 DIAGNOSIS — G894 Chronic pain syndrome: Secondary | ICD-10-CM | POA: Diagnosis not present

## 2016-09-15 DIAGNOSIS — M549 Dorsalgia, unspecified: Secondary | ICD-10-CM | POA: Diagnosis present

## 2016-09-15 DIAGNOSIS — D638 Anemia in other chronic diseases classified elsewhere: Secondary | ICD-10-CM | POA: Diagnosis not present

## 2016-09-15 DIAGNOSIS — G8929 Other chronic pain: Secondary | ICD-10-CM | POA: Diagnosis present

## 2016-09-15 DIAGNOSIS — Z87891 Personal history of nicotine dependence: Secondary | ICD-10-CM

## 2016-09-15 DIAGNOSIS — M25562 Pain in left knee: Secondary | ICD-10-CM | POA: Diagnosis present

## 2016-09-15 DIAGNOSIS — D72829 Elevated white blood cell count, unspecified: Secondary | ICD-10-CM | POA: Diagnosis present

## 2016-09-15 LAB — COMPREHENSIVE METABOLIC PANEL
ALBUMIN: 3.9 g/dL (ref 3.5–5.0)
ALT: 13 U/L — ABNORMAL LOW (ref 14–54)
ANION GAP: 7 (ref 5–15)
AST: 19 U/L (ref 15–41)
Alkaline Phosphatase: 64 U/L (ref 38–126)
BILIRUBIN TOTAL: 2.9 mg/dL — AB (ref 0.3–1.2)
BUN: 13 mg/dL (ref 6–20)
CHLORIDE: 106 mmol/L (ref 101–111)
CO2: 25 mmol/L (ref 22–32)
Calcium: 8.7 mg/dL — ABNORMAL LOW (ref 8.9–10.3)
Creatinine, Ser: 0.61 mg/dL (ref 0.44–1.00)
GFR calc Af Amer: >60 mL/min (ref >60)
GFR calc non Af Amer: >60 mL/min (ref >60)
GLUCOSE: 92 mg/dL (ref 65–99)
Potassium: 3.8 mmol/L (ref 3.5–5.1)
Sodium: 138 mmol/L (ref 135–145)
TOTAL PROTEIN: 6.6 g/dL (ref 6.5–8.1)

## 2016-09-15 LAB — CBC WITH DIFFERENTIAL/PLATELET
BASOS ABS: 0.1 10*3/uL (ref 0.0–0.1)
BASOS PCT: 1 %
EOS ABS: 0.7 10*3/uL (ref 0.0–0.7)
EOS PCT: 6 %
HEMATOCRIT: 22.8 % — AB (ref 36.0–46.0)
Hemoglobin: 8.1 g/dL — ABNORMAL LOW (ref 12.0–15.0)
LYMPHS ABS: 3.8 10*3/uL (ref 0.7–4.0)
LYMPHS PCT: 31 %
MCH: 29.8 pg (ref 26.0–34.0)
MCHC: 35.5 g/dL (ref 30.0–36.0)
MCV: 83.8 fL (ref 78.0–100.0)
Monocytes Absolute: 1.6 10*3/uL — ABNORMAL HIGH (ref 0.1–1.0)
Monocytes Relative: 13 %
NEUTROS PCT: 49 %
Neutro Abs: 6 10*3/uL (ref 1.7–7.7)
Platelets: 485 10*3/uL — ABNORMAL HIGH (ref 150–400)
RBC: 2.72 MIL/uL — ABNORMAL LOW (ref 3.87–5.11)
RDW: 18.3 % — AB (ref 11.5–15.5)
WBC: 12.2 10*3/uL — AB (ref 4.0–10.5)

## 2016-09-15 LAB — RETICULOCYTES
RBC.: 2.72 MIL/uL — ABNORMAL LOW (ref 3.87–5.11)
RETIC COUNT ABSOLUTE: 261.1 10*3/uL — AB (ref 19.0–186.0)
RETIC CT PCT: 9.6 % — AB (ref 0.4–3.1)

## 2016-09-15 MED ORDER — L-GLUTAMINE ORAL POWDER
15.0000 g | PACK | Freq: Two times a day (BID) | ORAL | Status: DC
  Administered 2016-09-15 – 2016-09-19 (×6): 15 g via ORAL
  Filled 2016-09-15 (×9): qty 3

## 2016-09-15 MED ORDER — MORPHINE SULFATE ER 30 MG PO TBCR
60.0000 mg | EXTENDED_RELEASE_TABLET | Freq: Two times a day (BID) | ORAL | Status: DC
  Administered 2016-09-15 – 2016-09-19 (×8): 60 mg via ORAL
  Filled 2016-09-15 (×8): qty 2

## 2016-09-15 MED ORDER — HYDROMORPHONE HCL 1 MG/ML IJ SOLN
2.0000 mg | INTRAMUSCULAR | Status: DC
  Administered 2016-09-15 (×2): 2 mg via INTRAVENOUS
  Filled 2016-09-15: qty 2

## 2016-09-15 MED ORDER — KETOROLAC TROMETHAMINE 30 MG/ML IJ SOLN
30.0000 mg | Freq: Four times a day (QID) | INTRAMUSCULAR | Status: DC
  Administered 2016-09-15 – 2016-09-19 (×16): 30 mg via INTRAVENOUS
  Filled 2016-09-15 (×16): qty 1

## 2016-09-15 MED ORDER — DEXTROSE-NACL 5-0.45 % IV SOLN
INTRAVENOUS | Status: DC
  Administered 2016-09-15: 09:00:00 via INTRAVENOUS

## 2016-09-15 MED ORDER — SODIUM CHLORIDE 0.9% FLUSH: 9.0000 mL | INTRAVENOUS | Status: DC | PRN

## 2016-09-15 MED ORDER — ADULT MULTIVITAMIN W/MINERALS CH
1.0000 | ORAL_TABLET | Freq: Every day | ORAL | Status: DC
Start: 2016-09-16 — End: 2016-09-19
  Administered 2016-09-16 – 2016-09-19 (×4): 1 via ORAL
  Filled 2016-09-15 (×4): qty 1

## 2016-09-15 MED ORDER — VALACYCLOVIR HCL 500 MG PO TABS
1000.0000 mg | ORAL_TABLET | Freq: Every day | ORAL | Status: DC
  Administered 2016-09-15 – 2016-09-19 (×5): 1000 mg via ORAL
  Filled 2016-09-15 (×5): qty 2

## 2016-09-15 MED ORDER — HYDROXYUREA 500 MG PO CAPS
1000.0000 mg | ORAL_CAPSULE | Freq: Every day | ORAL | Status: DC
  Administered 2016-09-15 – 2016-09-19 (×5): 1000 mg via ORAL
  Filled 2016-09-15 (×5): qty 2

## 2016-09-15 MED ORDER — HYDROMORPHONE HCL 1 MG/ML IJ SOLN
2.0000 mg | INTRAMUSCULAR | Status: AC
  Administered 2016-09-15: 2 mg via INTRAVENOUS
  Filled 2016-09-15 (×2): qty 2

## 2016-09-15 MED ORDER — ONDANSETRON HCL 4 MG/2ML IJ SOLN: 4.0000 mg | Freq: Four times a day (QID) | INTRAMUSCULAR | Status: DC | PRN

## 2016-09-15 MED ORDER — HYDROMORPHONE 1 MG/ML IV SOLN
INTRAVENOUS | Status: DC
  Administered 2016-09-15: 11:00:00 via INTRAVENOUS
  Filled 2016-09-15: qty 25

## 2016-09-15 MED ORDER — ONDANSETRON 4 MG PO TBDP
4.0000 mg | ORAL_TABLET | Freq: Once | ORAL | Status: AC
  Administered 2016-09-15: 4 mg via ORAL
  Filled 2016-09-15: qty 1

## 2016-09-15 MED ORDER — HYDROMORPHONE 1 MG/ML IV SOLN
INTRAVENOUS | Status: DC
  Administered 2016-09-15: 15.3 mg via INTRAVENOUS
  Administered 2016-09-15: 21:00:00 via INTRAVENOUS
  Administered 2016-09-15: 1 mg via INTRAVENOUS
  Administered 2016-09-15: 7.7 mg via INTRAVENOUS
  Administered 2016-09-16: 5.6 mg via INTRAVENOUS
  Administered 2016-09-16: 09:00:00 via INTRAVENOUS
  Administered 2016-09-16: 6.84 mg via INTRAVENOUS
  Administered 2016-09-16: 9.1 mg via INTRAVENOUS
  Filled 2016-09-15 (×2): qty 25

## 2016-09-15 MED ORDER — DEXTROSE-NACL 5-0.45 % IV SOLN
INTRAVENOUS | Status: DC
  Administered 2016-09-15 – 2016-09-16 (×3): via INTRAVENOUS

## 2016-09-15 MED ORDER — DIPHENHYDRAMINE HCL 25 MG PO CAPS
25.0000 mg | ORAL_CAPSULE | Freq: Once | ORAL | Status: AC
  Administered 2016-09-15: 25 mg via ORAL
  Filled 2016-09-15: qty 1

## 2016-09-15 MED ORDER — HYDROMORPHONE HCL 1 MG/ML IJ SOLN
0.5000 mg | Freq: Once | INTRAMUSCULAR | Status: AC
  Administered 2016-09-15: 0.5 mg via SUBCUTANEOUS
  Filled 2016-09-15: qty 1

## 2016-09-15 MED ORDER — SODIUM CHLORIDE 0.9 % IV SOLN
25.0000 mg | INTRAVENOUS | Status: DC | PRN
  Filled 2016-09-15: qty 0.5

## 2016-09-15 MED ORDER — RIVAROXABAN 20 MG PO TABS
20.0000 mg | ORAL_TABLET | Freq: Every day | ORAL | Status: DC
  Administered 2016-09-15 – 2016-09-16 (×2): 20 mg via ORAL
  Filled 2016-09-15 (×2): qty 1

## 2016-09-15 MED ORDER — SODIUM CHLORIDE 0.9 % IV BOLUS (SEPSIS)
1000.0000 mL | Freq: Once | INTRAVENOUS | Status: AC
  Administered 2016-09-15: 1000 mL via INTRAVENOUS

## 2016-09-15 MED ORDER — DIPHENHYDRAMINE HCL 25 MG PO CAPS: 25.0000 mg | ORAL_CAPSULE | ORAL | Status: DC | PRN

## 2016-09-15 MED ORDER — NALOXONE HCL 0.4 MG/ML IJ SOLN: 0.4000 mg | INTRAMUSCULAR | Status: DC | PRN

## 2016-09-15 NOTE — ED Triage Notes (Signed)
Pt states she woke up this morning about 2am with pain in both her legs from the knees down

## 2016-09-15 NOTE — ED Provider Notes (Signed)
[facility] DEPT Provider Note   CSN: [id number] Arrival date & time: 09/15/16  0555     History   Chief Complaint Chief Complaint  Patient presents with  . Sickle Cell Pain Crisis    HPI [name] is a 26 y.o. female.  HPI  This female presents with concern of bilateral knee pain.   patient is a history of sickle cell disease, notes the pain is the same as Typical pain for her.  Patient states that she was well when she went to bed last night, awoke with pain in her left leg. She took one oxycodone tablet, was able sleep for a few hours, but awoke with pain in both legs.  She denies other new pain, denies any nausea, vomiting, fever, chills, chest pain. However, the pain has become more severe, substantial, sore throughout both legs pain is worse with any motion.   Past Medical History:  Diagnosis Date  . Blood transfusion    "lots"  . Blood transfusion without reported diagnosis   . Chronic back pain    "very severe; have knot in my back; from tight muscle; take RX and exercise for it"  . Depression 01/06/2011  . Exertional dyspnea    "sometimes"  . Genital HSV   . GERD (gastroesophageal reflux disease) 02/17/2011  . Migraines 11/08/11   "@ least twice/month"  . 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  . Mood swings ([place]) 11/08/11   "I go back and forth; real bad"  . Sickle cell anemia ([name])   . Sickle cell anemia with crisis ([place])   . Trichotillomania    h/o    Patient Active Problem List   Diagnosis Date Noted  . Acute kidney injury ([place]) 08/25/2016  . 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 Chronic anticoagulation   . Sickle cell anemia ([place]) 05/19/2016  . History of DVT (deep vein thrombosis) 04/17/2016  . Episode of recurrent major depressive disorder ([place])   . Thrombocytosis ([place]) 11/09/2015  . Hyperbilirubinemia 11/09/2015  . Chronic pain 08/04/2015  . Herpes simplex 07/14/2015  . Anemia of chronic disease   .  Leukocytosis   . Hypokalemia 10/29/2014  . 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 Sickle cell disease ([place]) 01/08/2009  . 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 CHOLECYSTECTOMY  05/2010  . DILATION AND CURETTAGE OF UTERUS  02/20/11   S/P miscarriage  . IR GENERIC HISTORICAL  12/23/2015   IR FLUORO GUIDE CV LINE RIGHT 12/23/2015 Jacqulynn Cadet, MD [facility]-INTER[facility] RAD  . IR GENERIC HISTORICAL  12/23/2015   IR US GUIDE VASC ACCESS RIGHT 12/23/2015 Jacqulynn Cadet, MD [facility]-INTER[facility]    OB History    Gravida Para Term Preterm AB Living   3 2 2   1 2    SAB TAB Ectopic Multiple Live Births   1     0 2      Obstetric Comments   Miscarried in October 2012 at about 7 weeks       Home Medications    Prior to Admission medications   Medication Sig Start Date End Date Taking? Authorizing Provider  aspirin-acetaminophen-caffeine (EXCEDRIN MIGRAINE) [phone] MG tablet Take 2 tablets by mouth every 6 (six) hours as needed for headache.    Yes Historical Provider, MD  BIOTIN PO Take 2 tablets by mouth daily.   Yes Historical Provider, MD  hydroxyurea (HYDREA) 500 MG  capsule Take 2 capsules (1,000 mg total) by mouth daily. May take with food to minimize GI side effects. 04/18/16  Yes Tresa Garter, MD  L-glutamine (ENDARI) 5 g PACK Powder Packet Take 15 g by mouth 2 (two) times daily. 08/30/16  Yes [name], MD  morphine (MS CONTIN) 60 MG 12 hr tablet Take 60 mg by mouth every 12 (twelve) hours.  07/05/16  Yes Historical Provider, MD  Multiple Vitamins-Minerals ([employer] DAILY ENERGY) TABS Take 2 tablets by mouth daily.   Yes Historical Provider, MD  oxyCODONE (ROXICODONE) 15 MG immediate release tablet Take 15 mg by mouth every 4 (four) hours as needed for pain.  06/18/16  Yes Historical Provider, MD  Phytosterols (BAYER HEART HEALTH ADVANTAGE PO) Take 2 tablets by mouth daily.   Yes Historical  Provider, MD  rivaroxaban (XARELTO) 20 MG TABS tablet Take 20 mg by mouth daily.    Yes Historical Provider, MD  valACYclovir (VALTREX) 1000 MG tablet Take 1 tablet (1,000 mg total) by mouth daily. 05/02/16  Yes [name], MD    Family History Family History  Problem Relation Age of Onset  . Sickle cell trait Mother   . Sickle cell trait Father   . Diabetes Maternal Grandmother   . Diabetes Paternal Grandmother   . Hypertension Paternal Grandmother   . Diabetes Maternal Grandfather     Social History Social History  Substance Use Topics  . Smoking status: Former Smoker    Packs/day: 0.25    Years: 1.00    Types: Cigarettes    Quit date: 03/25/2013  . Smokeless tobacco: Never Used  . Alcohol use Yes     Comment: Twice a month     Allergies   Food and Latex   Review of Systems Review of Systems  Constitutional:       Per HPI, otherwise negative  HENT:       Per HPI, otherwise negative  Respiratory:       Per HPI, otherwise negative  Cardiovascular:       Per HPI, otherwise negative  Gastrointestinal: Negative for vomiting.  Endocrine:       Negative aside from HPI  Genitourinary:       Neg aside from HPI   Musculoskeletal:       Per HPI, otherwise negative  Skin: Negative.   Allergic/Immunologic: Positive for immunocompromised state.  Neurological: Negative for syncope.  Hematological:       Sickle cell disease     Physical Exam Updated Vital Signs BP (!) 107/57 (BP Location: Left Arm)   Pulse 72   Temp 98.1 F (36.7 C) (Oral)   Resp 18   Ht 5\' 1"  (1.549 m)   Wt 185 lb (83.9 kg)   SpO2 97%   BMI 34.96 kg/m   Physical Exam  Constitutional: She is oriented to person, place, and time. She appears well-developed and well-nourished. No distress.  HENT:  Head: Normocephalic and atraumatic.  Eyes: Conjunctivae and EOM are normal.  Cardiovascular: Normal rate and regular rhythm.   Pulmonary/Chest: Effort normal and breath sounds normal. No  stridor. No respiratory distress.  Chemotherapy port, right upper chest, unremarkable  Abdominal: She exhibits no distension.  Musculoskeletal: She exhibits no edema.  Though the patient describes pain in both knee, neither has notable deformity, both are grossly unremarkable.  Neurological: She is alert and oriented to person, place, and time. No cranial nerve deficit.  Skin: Skin is warm and dry.  Psychiatric: She  has a normal mood and affect.  Nursing note and vitals reviewed.    ED Treatments / Results  Labs (all labs ordered are listed, but only abnormal results are displayed) Labs Reviewed  CBC WITH DIFFERENTIAL/PLATELET - Abnormal; Notable for the following:       Result Value   WBC 12.2 (*)    RBC 2.72 (*)    Hemoglobin 8.1 (*)    HCT 22.8 (*)    RDW 18.3 (*)    Platelets 485 (*)    Monocytes Absolute 1.6 (*)    All other components within normal limits  COMPREHENSIVE METABOLIC PANEL - Abnormal; Notable for the following:    Calcium 8.7 (*)    ALT 13 (*)    Total Bilirubin 2.9 (*)    All other components within normal limits  RETICULOCYTES - Abnormal; Notable for the following:    Retic Ct Pct 9.6 (*)    RBC. 2.72 (*)    Retic Count, Manual 261.1 (*)    All other components within normal limits    Procedures Procedures (including critical care time)  Medications Ordered in ED Medications  HYDROmorphone (DILAUDID) injection 2 mg (not administered)  HYDROmorphone (DILAUDID) injection 0.5 mg (0.5 mg Subcutaneous Given 09/15/16 0642)  ondansetron (ZOFRAN-ODT) disintegrating tablet 4 mg (4 mg Oral Given 09/15/16 0641)  diphenhydrAMINE (BENADRYL) capsule 25 mg (25 mg Oral Given 09/15/16 0641)  sodium chloride 0.9 % bolus 1,000 mL (1,000 mLs Intravenous New Bag/Given 09/15/16 0647)     Initial Impression / Assessment and Plan / ED Course  I have reviewed the triage vital signs and the nursing notes.  Pertinent labs & imaging results that were available during my care of  the patient were reviewed by me and considered in my medical decision making (see chart for details).   after the initial evaluation the patient was started on medication for pain relief, received fluids, antiemetics.   patient remained hemodynamically stable throughout her fluid resuscitation, perception of analgesia.  After multiple rounds of Dilaudid, with no new complaints, with reassuring labs, but with no substantial change pain control, the patient was transferred to our sickle cell clinic  Final Clinical Impressions(s) / ED Diagnoses    Sickle cell pain crisis   Carmin Muskrat, MD 09/15/16 1009

## 2016-09-15 NOTE — Progress Notes (Signed)
Transferred pt from ED to [facility] via wheelchair.  Pt PAC already  accessed in ED and saline locked.

## 2016-09-15 NOTE — Progress Notes (Signed)
Report given to Festus Holts, RN via telephone. Vital signs within normal limits. Pt transferred via wheelchair from [facility] to 3W room 1343, with PCA.

## 2016-09-15 NOTE — H&P (Signed)
[name] is an 26 y.o. female.   Chief Complaint: Lower extremity pain HPI:  [name], an opiate tolerant female with a history of sickle cell anemia, HbSS who presented to the emergency department this am complaining of lower extremity pain. [name] says that pain is typical of her sickle cell crisis. Pain intensity is 10/10 and localized to lower extremities. [name] transitioned to the sickle cell day infusion center for extended observation and pain management. [name] was treated with high concentration PCA dilaudid with a 1 mg loading dose. [name] used a total of 15.3 mg of dilaudid in the day infusion center without sustained relief. Pain remains uncontrolled. [name] is describing pain as constant and throbbing. [name] will be admitted to inpatient services for a higher level of care.  Past Medical History:  Diagnosis Date  . Blood transfusion    "lots"  . Blood transfusion without reported diagnosis   . Chronic back pain    "very severe; have knot in my back; from tight muscle; take RX and exercise for it"  . Depression 01/06/2011  . Exertional dyspnea    "sometimes"  . Genital HSV   . GERD (gastroesophageal reflux disease) 02/17/2011  . Migraines 11/08/11   "@ least twice/month"  . 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  . Mood swings ([place]) 11/08/11   "I go back and forth; real bad"  . Sickle cell anemia ([name])   . Sickle cell anemia with crisis ([place])   . Trichotillomania    h/o    Past Surgical History:  Procedure Laterality Date  . CHOLECYSTECTOMY  05/2010  . DILATION AND CURETTAGE OF UTERUS  02/20/11   S/P miscarriage  . IR GENERIC HISTORICAL  12/23/2015   IR FLUORO GUIDE CV LINE RIGHT 12/23/2015 Jacqulynn Cadet, MD [facility]  . IR GENERIC HISTORICAL  12/23/2015   IR US GUIDE VASC ACCESS RIGHT 12/23/2015 Jacqulynn Cadet, MD [facility]    Family History  Problem Relation Age of Onset  . Sickle cell trait Mother   . Sickle cell trait Father   .  Diabetes Maternal Grandmother   . Diabetes Paternal Grandmother   . Hypertension Paternal Grandmother   . Diabetes Maternal Grandfather    Social History:  reports that [name] quit smoking about 3 years ago. Her smoking use included Cigarettes. [name] has a 0.25 pack-year smoking history. [name] has never used smokeless tobacco. [name] reports that [name] drinks alcohol. [name] reports that [name] does not use drugs.  Allergies:  Allergies  Allergen Reactions  . Food Hives and Other (See Comments)    Pt states that [name] is allergic to carrots.   . Latex Rash    Medications Prior to Admission  Medication Sig Dispense Refill  . BIOTIN PO Take 2 tablets by mouth daily.    . hydroxyurea (HYDREA) 500 MG capsule Take 2 capsules (1,000 mg total) by mouth daily. May take with food to minimize GI side effects. 180 capsule 3  . L-glutamine (ENDARI) 5 g PACK Powder Packet Take 15 g by mouth 2 (two) times daily.    Marland Kitchen morphine (MS CONTIN) 60 MG 12 hr tablet Take 60 mg by mouth every 12 (twelve) hours.     . Multiple Vitamins-Minerals ([employer] DAILY ENERGY) TABS Take 2 tablets by mouth daily.    Marland Kitchen oxyCODONE (ROXICODONE) 15 MG immediate release tablet Take 15 mg by mouth every 4 (four) hours as needed for pain.     . rivaroxaban (XARELTO)  20 MG TABS tablet Take 20 mg by mouth daily.     . valACYclovir (VALTREX) 1000 MG tablet Take 1 tablet (1,000 mg total) by mouth daily. 30 tablet 1  . aspirin-acetaminophen-caffeine (EXCEDRIN MIGRAINE) 694-854-62 MG tablet Take 2 tablets by mouth every 6 (six) hours as needed for headache.     . Phytosterols (BAYER HEART HEALTH ADVANTAGE PO) Take 2 tablets by mouth daily.      Results for orders placed or performed during the hospital encounter of 09/15/16 (from the past 48 hour(s))  CBC with Differential     Status: Abnormal   Collection Time: 09/15/16  6:35 AM  Result Value Ref Range   WBC 12.2 (H) 4.0 - 10.5 K/uL   RBC 2.72 (L) 3.87 - 5.11 MIL/uL   Hemoglobin 8.1 (L) 12.0 - 15.0 g/dL    HCT 22.8 (L) 36.0 - 46.0 %   MCV 83.8 78.0 - 100.0 fL   MCH 29.8 26.0 - 34.0 pg   MCHC 35.5 30.0 - 36.0 g/dL   RDW 18.3 (H) 11.5 - 15.5 %   Platelets 485 (H) 150 - 400 K/uL   Neutrophils Relative % 49 %   Neutro Abs 6.0 1.7 - 7.7 K/uL   Lymphocytes Relative 31 %   Lymphs Abs 3.8 0.7 - 4.0 K/uL   Monocytes Relative 13 %   Monocytes Absolute 1.6 (H) 0.1 - 1.0 K/uL   Eosinophils Relative 6 %   Eosinophils Absolute 0.7 0.0 - 0.7 K/uL   Basophils Relative 1 %   Basophils Absolute 0.1 0.0 - 0.1 K/uL  Comprehensive metabolic panel     Status: Abnormal   Collection Time: 09/15/16  6:35 AM  Result Value Ref Range   Sodium 138 135 - 145 mmol/L   Potassium 3.8 3.5 - 5.1 mmol/L   Chloride 106 101 - 111 mmol/L   CO2 25 22 - 32 mmol/L   Glucose, Bld 92 65 - 99 mg/dL   BUN 13 6 - 20 mg/dL   Creatinine, Ser 0.61 0.44 - 1.00 mg/dL   Calcium 8.7 (L) 8.9 - 10.3 mg/dL   Total Protein 6.6 6.5 - 8.1 g/dL   Albumin 3.9 3.5 - 5.0 g/dL   AST 19 15 - 41 U/L   ALT 13 (L) 14 - 54 U/L   Alkaline Phosphatase 64 38 - 126 U/L   Total Bilirubin 2.9 (H) 0.3 - 1.2 mg/dL   GFR calc non Af Amer >60 >60 mL/min   GFR calc Af Amer >60 >60 mL/min    Comment: (NOTE) The eGFR has been calculated using the CKD EPI equation. This calculation has not been validated in all clinical situations. eGFR's persistently <60 mL/min signify possible Chronic Kidney Disease.    Anion gap 7 5 - 15  Reticulocytes     Status: Abnormal   Collection Time: 09/15/16  6:35 AM  Result Value Ref Range   Retic Ct Pct 9.6 (H) 0.4 - 3.1 %   RBC. 2.72 (L) 3.87 - 5.11 MIL/uL   Retic Count, Manual 261.1 (H) 19.0 - 186.0 K/uL   *Note: Due to a large number of results and/or encounters for the requested time period, some results have not been displayed. A complete set of results can be found in Results Review.   No results found.  Review of Systems  Constitutional: Negative for chills, diaphoresis, fever, malaise/fatigue and weight  loss.  HENT: Negative.   Eyes: Negative.   Respiratory: Negative.  Negative for cough, hemoptysis, sputum  production, shortness of breath and wheezing.   Cardiovascular: Negative for chest pain, palpitations, orthopnea, leg swelling and PND.  Gastrointestinal: Negative.  Negative for abdominal pain, constipation, diarrhea, heartburn, melena, nausea and vomiting.  Genitourinary: Negative.   Musculoskeletal: Positive for joint pain and myalgias.       Bilateral lower extremity edema  Skin: Negative.  Negative for itching.  Neurological: Negative.  Negative for dizziness and weakness.  Psychiatric/Behavioral: Negative.     Blood pressure (!) 110/51, pulse 92, temperature 98.7 F (37.1 C), temperature source Oral, resp. rate 13, height 5' 1" (1.549 m), weight 185 lb (83.9 kg), SpO2 96 %, not currently breastfeeding. Physical Exam  Constitutional: [name] is oriented to person, place, and time.  HENT:  Head: Normocephalic and atraumatic.  Right Ear: External ear normal.  Left Ear: External ear normal.  Nose: Nose normal.  Mouth/Throat: Oropharynx is clear and moist.  Eyes: EOM are normal. Pupils are equal, round, and reactive to light.  Neck: Normal range of motion. Neck supple.  Cardiovascular: Normal rate, regular rhythm, normal heart sounds and intact distal pulses.   Respiratory: Effort normal.  GI: Bowel sounds are normal. [name] exhibits no distension. There is no tenderness.  Musculoskeletal: [name] exhibits tenderness.       Right knee: [name] exhibits decreased range of motion.       Left knee: [name] exhibits decreased range of motion.  Neurological: [name] is alert and oriented to person, place, and time. [name] has normal reflexes.  Skin: Skin is warm and dry.  Psychiatric: [name] has a normal mood and affect. Her behavior is normal. Judgment and thought content normal.     Assessment/Plan Sickle cell pain crisis  Patient will be admitted to inpatient services for sickle cell crisis  Continue  IV D5.45 for cellular rehydration at 125/h  Continue Dilaudid PCA High Concentration per weight based protocol.   Patient will be re-evaluated for pain intensity in the context of function and relationship to baseline as care progresses.       , M, FNP 09/15/2016, 4:03 PM

## 2016-09-15 NOTE — H&P (Signed)
[facility] History and Physical   Date: 09/15/2016  Patient name: [name] Medical record number: [id number] Date of birth: October 19, 1990 Age: 26 y.o. Gender: female PCP: Ricke Hey, MD  Attending physician: Tresa Garter, MD  Chief Complaint: Lower extremity pain   History of Present Illness: [name], a 26 year old female with a history of sickle cell anemia, HbSS presents with a sickle cell crisis. Patient transitioned from emergency department for extended observation. I spoke with Dr. Vanita Panda, emergency room physician, who states that labs and imaging are consistent with patient's baseline. Patient will transition for  further pain management.    Patient arrived via wheelchair crying and inconsolable. She says that pain intensity is 10/10 and is primarily to lower extremities. She says that pain is constant and sharp. Patient endorses fatigue. She denies chest pain, shortness of breath, fever, abdominal pain, nausea, vomiting, or diarrhea.  Meds: Prescriptions Prior to Admission  Medication Sig Dispense Refill Last Dose  . aspirin-acetaminophen-caffeine (EXCEDRIN MIGRAINE) 250-250-65 MG tablet Take 2 tablets by mouth every 6 (six) hours as needed for headache.    Past Month at Unknown time  . BIOTIN PO Take 2 tablets by mouth daily.   09/14/2016 at Unknown time  . hydroxyurea (HYDREA) 500 MG capsule Take 2 capsules (1,000 mg total) by mouth daily. May take with food to minimize GI side effects. 180 capsule 3 09/14/2016 at Unknown time  . L-glutamine (ENDARI) 5 g PACK Powder Packet Take 15 g by mouth 2 (two) times daily.   Past Month at Unknown time  . morphine (MS CONTIN) 60 MG 12 hr tablet Take 60 mg by mouth every 12 (twelve) hours.    Past Week at Unknown time  . Multiple Vitamins-Minerals ([employer] DAILY ENERGY) TABS Take 2 tablets by mouth daily.   09/14/2016 at Unknown time  . oxyCODONE (ROXICODONE) 15 MG immediate release tablet Take 15 mg by  mouth every 4 (four) hours as needed for pain.    09/15/2016 at 0100  . Phytosterols (BAYER HEART HEALTH ADVANTAGE PO) Take 2 tablets by mouth daily.   09/14/2016 at Unknown time  . rivaroxaban (XARELTO) 20 MG TABS tablet Take 20 mg by mouth daily.    09/14/2016 at 0830  . valACYclovir (VALTREX) 1000 MG tablet Take 1 tablet (1,000 mg total) by mouth daily. 30 tablet 1 09/14/2016 at Unknown time    Allergies: Food and Latex Past Medical History:  Diagnosis Date  . Blood transfusion    "lots"  . Blood transfusion without reported diagnosis   . Chronic back pain    "very severe; have knot in my back; from tight muscle; take RX and exercise for it"  . Depression 01/06/2011  . Exertional dyspnea    "sometimes"  . Genital HSV   . GERD (gastroesophageal reflux disease) 02/17/2011  . Migraines 11/08/11   "@ least twice/month"  . 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  . Mood swings ([place]) 11/08/11   "I go back and forth; real bad"  . Sickle cell anemia ([name])   . Sickle cell anemia with crisis ([place])   . Trichotillomania    h/o   Past Surgical History:  Procedure Laterality Date  . CHOLECYSTECTOMY  05/2010  . DILATION AND CURETTAGE OF UTERUS  02/20/11   S/P miscarriage  . IR GENERIC HISTORICAL  12/23/2015   IR FLUORO GUIDE CV LINE RIGHT 12/23/2015 Jacqulynn Cadet, MD [facility]  . IR GENERIC HISTORICAL  12/23/2015   IR US GUIDE VASC ACCESS RIGHT 12/23/2015 Jacqulynn Cadet, MD [facility]   Family History  Problem Relation Age of Onset  . Sickle cell trait Mother   . Sickle cell trait Father   . Diabetes Maternal Grandmother   . Diabetes Paternal Grandmother   . Hypertension Paternal Grandmother   . Diabetes Maternal Grandfather    Social History   Social History  . Marital status: Single    Spouse name: N/A  . Number of children: N/A  . Years of education: N/A   Occupational History  . Not on file.   Social History Main Topics  . Smoking status: Former Smoker     Packs/day: 0.25    Years: 1.00    Types: Cigarettes    Quit date: 03/25/2013  . Smokeless tobacco: Never Used  . Alcohol use Yes     Comment: Twice a month  . Drug use: No     Comment: "Clean for 3 months"  . Sexual activity: Yes    Birth control/ protection: None     Comment: last sex Jul 11 2015   Other Topics Concern  . 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    Review of Systems: Constitutional: positive for fatigue, negative for fevers, sweats and weight loss Eyes: negative for glaucoma, redness and visual disturbance Ears, nose, mouth, throat, and face: negative Respiratory: negative for cough, dyspnea on exertion and wheezing Cardiovascular: negative for chest pain, dyspnea and fatigue Gastrointestinal: negative for abdominal pain, constipation, diarrhea and nausea Genitourinary:positive for dysuria, negative for hematuria Hematologic/lymphatic: negative Musculoskeletal:positive for arthralgias and myalgias Neurological: positive for weakness, negative for dizziness and paresthesia Behavioral/Psych: positive for anxiety, fatigue, irritability and mood swings Allergic/Immunologic: negative  Physical Exam: not currently breastfeeding. BP (!) 110/51 (BP Location: Left Arm)   Pulse 92   Temp 98.7 F (37.1 C) (Oral)   Resp 13   Ht 5\' 1"  (1.549 m)   Wt 185 lb (83.9 kg)   SpO2 96%   BMI 34.96 kg/m   General Appearance:    Alert, cooperative, moderate distress, appears stated age  Head:    Normocephalic, without obvious abnormality, atraumatic  Eyes:    PERRL, conjunctiva/corneas clear, EOM's intact, fundi    benign, both eyes  Ears:    Normal TM's and external ear canals, both ears  Nose:   Nares normal, septum midline, mucosa normal, no drainage    or sinus tenderness  Throat:   Lips, mucosa, and tongue normal; teeth and gums normal  Neck:   Supple,  symmetrical, trachea midline, no adenopathy;    thyroid:  no enlargement/tenderness/nodules; no carotid   bruit or JVD  Back:     Symmetric, no curvature, ROM normal, no CVA tenderness  Lungs:     Clear to auscultation bilaterally, respirations unlabored  Chest Wall:    No tenderness or deformity   Heart:    Regular rate and rhythm, S1 and S2 normal, no murmur, rub   or gallop  Abdomen:     Soft, non-tender, bowel sounds active all four quadrants,    no masses, no organomegaly  Extremities:   Extremities normal, atraumatic, no cyanosis or edema  Pulses:   2+ and symmetric all extremities  Skin:   Skin color, texture, turgor normal, no rashes or lesions  Lymph nodes:  Cervical, supraclavicular, and axillary nodes normal  Neurologic:   CNII-XII intact, normal strength, sensation and reflexes    throughout    Lab results: Results for orders placed or performed during the hospital encounter of 09/15/16 (from the past 24 hour(s))  CBC with Differential     Status: Abnormal   Collection Time: 09/15/16  6:35 AM  Result Value Ref Range   WBC 12.2 (H) 4.0 - 10.5 K/uL   RBC 2.72 (L) 3.87 - 5.11 MIL/uL   Hemoglobin 8.1 (L) 12.0 - 15.0 g/dL   HCT 22.8 (L) 36.0 - 46.0 %   MCV 83.8 78.0 - 100.0 fL   MCH 29.8 26.0 - 34.0 pg   MCHC 35.5 30.0 - 36.0 g/dL   RDW 18.3 (H) 11.5 - 15.5 %   Platelets 485 (H) 150 - 400 K/uL   Neutrophils Relative % 49 %   Neutro Abs 6.0 1.7 - 7.7 K/uL   Lymphocytes Relative 31 %   Lymphs Abs 3.8 0.7 - 4.0 K/uL   Monocytes Relative 13 %   Monocytes Absolute 1.6 (H) 0.1 - 1.0 K/uL   Eosinophils Relative 6 %   Eosinophils Absolute 0.7 0.0 - 0.7 K/uL   Basophils Relative 1 %   Basophils Absolute 0.1 0.0 - 0.1 K/uL  Comprehensive metabolic panel     Status: Abnormal   Collection Time: 09/15/16  6:35 AM  Result Value Ref Range   Sodium 138 135 - 145 mmol/L   Potassium 3.8 3.5 - 5.1 mmol/L   Chloride 106 101 - 111 mmol/L   CO2 25 22 - 32 mmol/L   Glucose, Bld 92 65  - 99 mg/dL   BUN 13 6 - 20 mg/dL   Creatinine, Ser 0.61 0.44 - 1.00 mg/dL   Calcium 8.7 (L) 8.9 - 10.3 mg/dL   Total Protein 6.6 6.5 - 8.1 g/dL   Albumin 3.9 3.5 - 5.0 g/dL   AST 19 15 - 41 U/L   ALT 13 (L) 14 - 54 U/L   Alkaline Phosphatase 64 38 - 126 U/L   Total Bilirubin 2.9 (H) 0.3 - 1.2 mg/dL   GFR calc non Af Amer >60 >60 mL/min   GFR calc Af Amer >60 >60 mL/min   Anion gap 7 5 - 15  Reticulocytes     Status: Abnormal   Collection Time: 09/15/16  6:35 AM  Result Value Ref Range   Retic Ct Pct 9.6 (H) 0.4 - 3.1 %   RBC. 2.72 (L) 3.87 - 5.11 MIL/uL   Retic Count, Manual 261.1 (H) 19.0 - 186.0 K/uL   *Note: Due to a large number of results and/or encounters for the requested time period, some results have not been displayed. A complete set of results can be found in Results Review.    Imaging results:  No results found.   Assessment & Plan:  Patient will be admitted to the day infusion center for extended observation  Start IV D5.45 for cellular rehydration at 125/hr  Start Dilaudid PCA High Concentration per weight based protocol.   Patient will be re-evaluated for pain intensity in the context of function and relationship to baseline as care progresses.  If no significant pain relief, will transfer patient to inpatient services for a higher level of care.   Reviewed labs from hospital, consistent with baseline    Eschol Auxier M 09/15/2016, 10:47 AM

## 2016-09-16 DIAGNOSIS — G8929 Other chronic pain: Secondary | ICD-10-CM | POA: Diagnosis not present

## 2016-09-16 DIAGNOSIS — M549 Dorsalgia, unspecified: Secondary | ICD-10-CM | POA: Diagnosis not present

## 2016-09-16 DIAGNOSIS — K219 Gastro-esophageal reflux disease without esophagitis: Secondary | ICD-10-CM | POA: Diagnosis not present

## 2016-09-16 DIAGNOSIS — Z7901 Long term (current) use of anticoagulants: Secondary | ICD-10-CM | POA: Diagnosis not present

## 2016-09-16 DIAGNOSIS — D638 Anemia in other chronic diseases classified elsewhere: Secondary | ICD-10-CM | POA: Diagnosis not present

## 2016-09-16 DIAGNOSIS — D5700 Hb-SS disease with crisis, unspecified: Secondary | ICD-10-CM | POA: Diagnosis not present

## 2016-09-16 DIAGNOSIS — D72829 Elevated white blood cell count, unspecified: Secondary | ICD-10-CM | POA: Diagnosis not present

## 2016-09-16 LAB — URINALYSIS, COMPLETE (UACMP) WITH MICROSCOPIC
Bacteria, UA: NONE SEEN
Bilirubin Urine: NEGATIVE
GLUCOSE, UA: NEGATIVE mg/dL
HGB URINE DIPSTICK: NEGATIVE
Ketones, ur: NEGATIVE mg/dL
Leukocytes, UA: NEGATIVE
NITRITE: NEGATIVE
Protein, ur: NEGATIVE mg/dL
SPECIFIC GRAVITY, URINE: 1.009 (ref 1.005–1.030)
pH: 5 (ref 5.0–8.0)

## 2016-09-16 LAB — CBC
HCT: 22.9 % — ABNORMAL LOW (ref 36.0–46.0)
Hemoglobin: 8 g/dL — ABNORMAL LOW (ref 12.0–15.0)
MCH: 29.3 pg (ref 26.0–34.0)
MCHC: 34.9 g/dL (ref 30.0–36.0)
MCV: 83.9 fL (ref 78.0–100.0)
PLATELETS: 512 10*3/uL — AB (ref 150–400)
RBC: 2.73 MIL/uL — AB (ref 3.87–5.11)
RDW: 18.8 % — AB (ref 11.5–15.5)
WBC: 14.2 10*3/uL — AB (ref 4.0–10.5)

## 2016-09-16 LAB — BASIC METABOLIC PANEL
Anion gap: 7 (ref 5–15)
BUN: 9 mg/dL (ref 6–20)
CALCIUM: 8.6 mg/dL — AB (ref 8.9–10.3)
CO2: 26 mmol/L (ref 22–32)
CREATININE: 0.57 mg/dL (ref 0.44–1.00)
Chloride: 103 mmol/L (ref 101–111)
GFR calc non Af Amer: >60 mL/min (ref >60)
Glucose, Bld: 115 mg/dL — ABNORMAL HIGH (ref 65–99)
Potassium: 3.5 mmol/L (ref 3.5–5.1)
SODIUM: 136 mmol/L (ref 135–145)

## 2016-09-16 LAB — DIFFERENTIAL
BASOS PCT: 1 %
Basophils Absolute: 0.1 10*3/uL (ref 0.0–0.1)
EOS PCT: 4 %
Eosinophils Absolute: 0.6 10*3/uL (ref 0.0–0.7)
Lymphocytes Relative: 24 %
Lymphs Abs: 3.4 10*3/uL (ref 0.7–4.0)
MONOS PCT: 15 %
Monocytes Absolute: 2.1 10*3/uL — ABNORMAL HIGH (ref 0.1–1.0)
NEUTROS ABS: 8 10*3/uL — AB (ref 1.7–7.7)
NEUTROS PCT: 56 %
NRBC: 1 /100{WBCs} — AB

## 2016-09-16 LAB — RAPID URINE DRUG SCREEN, HOSP PERFORMED
AMPHETAMINES: NOT DETECTED
Barbiturates: NOT DETECTED
Benzodiazepines: NOT DETECTED
Cocaine: NOT DETECTED
Opiates: POSITIVE — AB
TETRAHYDROCANNABINOL: NOT DETECTED

## 2016-09-16 LAB — RETICULOCYTES
RBC.: 2.73 MIL/uL — AB (ref 3.87–5.11)
Retic Count, Absolute: 319.4 10*3/uL — ABNORMAL HIGH (ref 19.0–186.0)
Retic Ct Pct: 11.7 % — ABNORMAL HIGH (ref 0.4–3.1)

## 2016-09-16 MED ORDER — HYDROMORPHONE HCL 1 MG/ML IJ SOLN
1.0000 mg | Freq: Once | INTRAMUSCULAR | Status: AC
  Administered 2016-09-16: 1 mg via INTRAVENOUS

## 2016-09-16 MED ORDER — HYDROMORPHONE HCL 1 MG/ML IJ SOLN
2.0000 mg | INTRAMUSCULAR | Status: AC
  Administered 2016-09-16 – 2016-09-17 (×4): 2 mg via INTRAVENOUS
  Filled 2016-09-16 (×4): qty 2

## 2016-09-16 MED ORDER — HYDROMORPHONE HCL 1 MG/ML IJ SOLN
2.0000 mg | Freq: Once | INTRAMUSCULAR | Status: AC
  Administered 2016-09-16: 2 mg via INTRAVENOUS
  Filled 2016-09-16: qty 2

## 2016-09-16 MED ORDER — HYDROMORPHONE HCL 1 MG/ML IJ SOLN
INTRAMUSCULAR | Status: AC
  Filled 2016-09-16: qty 1

## 2016-09-16 MED ORDER — HYDROMORPHONE 1 MG/ML IV SOLN
INTRAVENOUS | Status: DC
  Administered 2016-09-16: 7.2 mg via INTRAVENOUS
  Administered 2016-09-16: 3.6 mg via INTRAVENOUS
  Administered 2016-09-16: 11 mg via INTRAVENOUS
  Administered 2016-09-16: 9 mg via INTRAVENOUS
  Administered 2016-09-17: 15:00:00 via INTRAVENOUS
  Administered 2016-09-17: 0.6 mg via INTRAVENOUS
  Administered 2016-09-17: 10.2 mg via INTRAVENOUS
  Administered 2016-09-17: 4.8 mg via INTRAVENOUS
  Administered 2016-09-17: 11.4 mg via INTRAVENOUS
  Administered 2016-09-18: 3 mg via INTRAVENOUS
  Administered 2016-09-18: 12.6 mg via INTRAVENOUS
  Administered 2016-09-18: 02:00:00 via INTRAVENOUS
  Administered 2016-09-18: 5.74 mg via INTRAVENOUS
  Filled 2016-09-16 (×3): qty 25

## 2016-09-16 NOTE — Consult Note (Signed)
   [facility] CM Inpatient Consult   09/16/2016  [name] 1991-01-14 [id number]    Referral received from [facility] Management office due to ED high utilization.   Spoke with [name] via phone to confirm Primary Care Provider. [name] states her Primary Care Provider is Dr. Ricke Hey.  Dr. Alyson Ingles is not a [facility] Provider. Therefore, [name] is not eligible for [facility] Care Management services. Made [facility] Care Management office aware of the above.   Marthenia Rolling, MSN-Ed, RN,BSN [facility] Liaison [phone]

## 2016-09-16 NOTE — Progress Notes (Signed)
SICKLE CELL SERVICE PROGRESS NOTE  [name] ION:[id number] DOB: 1991/05/02 DOA: 09/15/2016 PCP: Ricke Hey, MD  Assessment/Plan: Active Problems:   Hb-SS disease with crisis ([name])   Sickle cell pain crisis ([name])  1. Hb SS with crisis: PCA adjusted to PCA bolus of 0.6 mg as higher doses cause allodynia in this patient. Continue Toradol and decrease IVF to [name]. Also continue MS Contin. Will also schedule clinician assisted doses throughout the night to allow for rest for the patient. These will not be continued during the day tomorrow.  2. Anemia of Chronic Disease: Hb at baseline 3. Leukocytosis: No evidence of infection. Likely related to sickle cell crisis. 4. Chronic Anticoagulation: Continue Xarelto   Code Status: Full Code Family Communication: N/A Disposition Plan: Not yet ready for discharge  [place].  Pager [phone]. If 7PM-7AM, please contact night-coverage.  09/16/2016, 11:17 AM  LOS: 1 day   Interim History: Pt crying in pain. Reports pain at 10/10 and localized to BLE's and right elbow. She has used 50.54 mg of Dilaudid  with 82/75: demands/deliveries in the past 24 hours.   Consultants:  None  Procedures:  None  Antibiotics:  None    Objective: Vitals:   09/16/16 0504 09/16/16 0734 09/16/16 0902 09/16/16 0942  BP: 116/82   99/76  Pulse: (!) 102   (!) 101  Resp: 12 14 13 14   Temp: 99 F (37.2 C)   98.3 F (36.8 C)  TempSrc: Oral   Oral  SpO2: 96% 98% 98% 97%  Weight:      Height:       Weight change:   Intake/Output Summary (Last 24 hours) at 09/16/16 1117 Last data filed at 09/16/16 1014  Gross per 24 hour  Intake          3187.44 ml  Output                0 ml  Net          3187.44 ml       Physical Exam General: Alert, awake, oriented x3, in moderate distress.  HEENT: [place]/AT PEERL, EOMI, anicteric Neck: Trachea midline,  no masses, no thyromegal,y no JVD, no carotid bruit OROPHARYNX:  Moist, No exudate/  erythema/lesions.  Heart: Regular rate and rhythm, without murmurs, rubs, gallops, PMI non-displaced, no heaves or thrills on palpation.  Lungs: Clear to auscultation, no wheezing or rhonchi noted. No increased vocal fremitus resonant to percussion  Abdomen: Soft, nontender, nondistended, positive bowel sounds, no masses no hepatosplenomegaly noted.  Neuro: No focal neurological deficits noted cranial nerves II through XII grossly intact. . Strength at baseline in bilateral upper and not tested in bilateral lower extremities due to pain.. Musculoskeletal: No warmth swelling or erythema around joints, no spinal tenderness noted. Psychiatric: Patient alert and oriented x3, good insight and cognition, good recent to remote recall.   Data Reviewed: Basic Metabolic Panel:  Recent Labs Lab 09/15/16 0635 09/16/16 0601  NA 138 136  K 3.8 3.5  CL 106 103  CO2 25 26  GLUCOSE 92 115*  BUN 13 9  CREATININE 0.61 0.57  CALCIUM 8.7* 8.6*   Liver Function Tests:  Recent Labs Lab 09/15/16 0635  AST 19  ALT 13*  ALKPHOS 64  BILITOT 2.9*  PROT 6.6  ALBUMIN 3.9   No results for input(s): LIPASE, AMYLASE in the last 168 hours. No results for input(s): AMMONIA in the last 168 hours. CBC:  Recent Labs Lab 09/15/16 [phone] 09/16/16 0601  WBC 12.2* 14.2*  NEUTROABS 6.0  --   HGB 8.1* 8.0*  HCT 22.8* 22.9*  MCV 83.8 83.9  PLT 485* 512*   Cardiac Enzymes: No results for input(s): CKTOTAL, CKMB, CKMBINDEX, TROPONINI in the last 168 hours. BNP (last 3 results) No results for input(s): BNP in the last 8760 hours.  ProBNP (last 3 results) No results for input(s): PROBNP in the last 8760 hours.  CBG: No results for input(s): GLUCAP in the last 168 hours.  No results found for this or any previous visit (from the past 240 hour(s)).   Studies: No results found.  Scheduled Meds: . HYDROmorphone      . HYDROmorphone   Intravenous Q4H  . hydroxyurea  1,000 mg Oral Daily  . ketorolac   30 mg Intravenous Q6H  . L-glutamine  15 g Oral BID  . morphine  60 mg Oral Q12H  . multivitamin with minerals  1 tablet Oral Daily  . rivaroxaban  20 mg Oral QAC supper  . valACYclovir  1,000 mg Oral Daily   Continuous Infusions: . dextrose 5 % and 0.45% NaCl 150 mL/hr at 09/16/16 0605  . diphenhydrAMINE (BENADRYL) IVPB(SICKLE CELL ONLY)      Active Problems:   Hb-SS disease with crisis ([name])   Sickle cell pain crisis ([name])    In excess of 25 minutes spent during this visit. Greater than 50% involved face to face contact with the patient for assessment, counseling and coordination of care.

## 2016-09-17 DIAGNOSIS — D5700 Hb-SS disease with crisis, unspecified: Secondary | ICD-10-CM | POA: Diagnosis not present

## 2016-09-17 MED ORDER — RIVAROXABAN 20 MG PO TABS
20.0000 mg | ORAL_TABLET | Freq: Every day | ORAL | Status: DC
  Administered 2016-09-17 – 2016-09-18 (×2): 20 mg via ORAL
  Filled 2016-09-17 (×2): qty 1

## 2016-09-17 NOTE — Progress Notes (Signed)
Patient ID: [name], female   DOB: 26-Nov-1990, 26 y.o.   MRN: [id number] Subjective:  Patient has no new complaint today. She however still complaining of significant pain, rated at 8/10, mostly on her legs and lower back. No fever, no chest pain. He denies any SOB  Objective:  Vital signs in last 24 hours:  Vitals:   09/17/16 0119 09/17/16 0513 09/17/16 0556 09/17/16 0800  BP: 131/64  125/87   Pulse: 87  (!) 106   Resp: 14 12 12 12   Temp: 97.7 F (36.5 C)  98.5 F (36.9 C)   TempSrc: Oral  Oral   SpO2: 98% 99% 97% 95%  Weight:   86.3 kg (190 lb 4.1 oz)   Height:       Intake/Output from previous day:   Intake/Output Summary (Last 24 hours) at 09/17/16 0951 Last data filed at 09/17/16 0557  Gross per 24 hour  Intake           2268.6 ml  Output                1 ml  Net           2267.6 ml   Physical Exam: General: Alert, awake, oriented x3, in no acute distress.  HEENT: [place]/AT PEERL, EOMI Neck: Trachea midline,  no masses, no thyromegal,y no JVD, no carotid bruit OROPHARYNX:  Moist, No exudate/ erythema/lesions.  Heart: Regular rate and rhythm, without murmurs, rubs, gallops, PMI non-displaced, no heaves or thrills on palpation.  Lungs: Clear to auscultation, no wheezing or rhonchi noted. No increased vocal fremitus resonant to percussion  Abdomen: Soft, nontender, nondistended, positive bowel sounds, no masses no hepatosplenomegaly noted..  Neuro: No focal neurological deficits noted cranial nerves II through XII grossly intact. DTRs 2+ bilaterally upper and lower extremities. Strength 5 out of 5 in bilateral upper and lower extremities. Musculoskeletal: No warm swelling or erythema around joints, no spinal tenderness noted. Psychiatric: Patient alert and oriented x3, good insight and cognition, good recent to remote recall. Lymph node survey: No cervical axillary or inguinal lymphadenopathy noted.  Lab Results:  Basic Metabolic Panel:    Component Value  Date/Time   NA 136 09/16/2016 0601   NA 139 05/06/2012 1645   K 3.5 09/16/2016 0601   K 3.9 05/06/2012 1645   CL 103 09/16/2016 0601   CL 110 (H) 05/06/2012 1645   CO2 26 09/16/2016 0601   CO2 22 05/06/2012 1645   BUN 9 09/16/2016 0601   BUN 9 05/06/2012 1645   CREATININE 0.57 09/16/2016 0601   CREATININE 0.43 (L) 07/06/2015 1356   GLUCOSE 115 (H) 09/16/2016 0601   GLUCOSE 84 05/06/2012 1645   CALCIUM 8.6 (L) 09/16/2016 0601   CALCIUM 8.3 (L) 05/06/2012 1645   CBC:    Component Value Date/Time   WBC 14.2 (H) 09/16/2016 0601   HGB 8.0 (L) 09/16/2016 0601   HGB 8.7 (L) 05/11/2012 0721   HCT 22.9 (L) 09/16/2016 0601   HCT 26.9 (L) 05/11/2012 0721   PLT 512 (H) 09/16/2016 0601   PLT 497 (H) 05/11/2012 0721   MCV 83.9 09/16/2016 0601   MCV 94 05/11/2012 0721   NEUTROABS 8.0 (H) 09/16/2016 0601   NEUTROABS 10.9 (H) 05/10/2012 0552   LYMPHSABS 3.4 09/16/2016 0601   LYMPHSABS 3.5 05/10/2012 0552   MONOABS 2.1 (H) 09/16/2016 0601   MONOABS 1.7 (H) 05/10/2012 0552   EOSABS 0.6 09/16/2016 0601   EOSABS 0.8 (H) 05/10/2012 0552   BASOSABS  0.1 09/16/2016 0601   BASOSABS 0.1 05/10/2012 0552    No results found for this or any previous visit (from the past 240 hour(s)).  Studies/Results: No results found.  Medications: Scheduled Meds: . HYDROmorphone   Intravenous Q4H  . hydroxyurea  1,000 mg Oral Daily  . ketorolac  30 mg Intravenous Q6H  . L-glutamine  15 g Oral BID  . morphine  60 mg Oral Q12H  . multivitamin with minerals  1 tablet Oral Daily  . rivaroxaban  20 mg Oral QAC supper  . valACYclovir  1,000 mg Oral Daily   Continuous Infusions: . dextrose 5 % and 0.45% NaCl 10 mL/hr at 09/16/16 1137  . diphenhydrAMINE (BENADRYL) IVPB(SICKLE CELL ONLY)     PRN Meds:.diphenhydrAMINE **OR** diphenhydrAMINE (BENADRYL) IVPB(SICKLE CELL ONLY), naloxone **AND** sodium chloride flush, ondansetron (ZOFRAN)  IV  Consultants:  None  Procedures:  None  Antibiotics:  None  Assessment/Plan: Active Problems:   Hb-SS disease with crisis ([name])   Sickle cell pain crisis ([place])  1. Hb SS with crisis: Continue current setting of Dilaudid PCA, continue Toradol. KVO. Also continue MS Contin.  2. Anemia of Chronic Disease: Hb at baseline, continue to monitor 3. Leukocytosis: No evidence of infection. Likely related to sickle cell crisis. 4. Chronic Anticoagulation: Continue Xarelto  Code Status: Full Code Family Communication: N/A Disposition Plan: Anticipate discharge tomorrow  Alcides Nutting  If 7PM-7AM, please contact night-coverage.  09/17/2016, 9:51 AM  LOS: 2 days

## 2016-09-18 DIAGNOSIS — G8929 Other chronic pain: Secondary | ICD-10-CM | POA: Diagnosis not present

## 2016-09-18 DIAGNOSIS — D638 Anemia in other chronic diseases classified elsewhere: Secondary | ICD-10-CM | POA: Diagnosis not present

## 2016-09-18 DIAGNOSIS — D72829 Elevated white blood cell count, unspecified: Secondary | ICD-10-CM | POA: Diagnosis not present

## 2016-09-18 DIAGNOSIS — K219 Gastro-esophageal reflux disease without esophagitis: Secondary | ICD-10-CM | POA: Diagnosis not present

## 2016-09-18 DIAGNOSIS — D5700 Hb-SS disease with crisis, unspecified: Secondary | ICD-10-CM | POA: Diagnosis not present

## 2016-09-18 DIAGNOSIS — M549 Dorsalgia, unspecified: Secondary | ICD-10-CM | POA: Diagnosis not present

## 2016-09-18 LAB — CBC WITH DIFFERENTIAL/PLATELET
Basophils Absolute: 0.1 10*3/uL (ref 0.0–0.1)
Basophils Relative: 0 %
EOS ABS: 0.7 10*3/uL (ref 0.0–0.7)
EOS PCT: 5 %
HCT: 20.8 % — ABNORMAL LOW (ref 36.0–46.0)
HEMOGLOBIN: 7.3 g/dL — AB (ref 12.0–15.0)
LYMPHS ABS: 3 10*3/uL (ref 0.7–4.0)
Lymphocytes Relative: 22 %
MCH: 29.6 pg (ref 26.0–34.0)
MCHC: 35.1 g/dL (ref 30.0–36.0)
MCV: 84.2 fL (ref 78.0–100.0)
MONO ABS: 1.1 10*3/uL — AB (ref 0.1–1.0)
MONOS PCT: 8 %
Neutro Abs: 8.6 10*3/uL — ABNORMAL HIGH (ref 1.7–7.7)
Neutrophils Relative %: 65 %
Platelets: 457 10*3/uL — ABNORMAL HIGH (ref 150–400)
RBC: 2.47 MIL/uL — ABNORMAL LOW (ref 3.87–5.11)
RDW: 19.2 % — ABNORMAL HIGH (ref 11.5–15.5)
WBC: 13.5 10*3/uL — ABNORMAL HIGH (ref 4.0–10.5)

## 2016-09-18 MED ORDER — HYDROMORPHONE 1 MG/ML IV SOLN
INTRAVENOUS | Status: DC
  Administered 2016-09-18: 9.9 mg via INTRAVENOUS
  Administered 2016-09-18: 20:00:00 via INTRAVENOUS
  Administered 2016-09-18: 9.6 mg via INTRAVENOUS
  Administered 2016-09-19: 9 mg via INTRAVENOUS
  Administered 2016-09-19: 10.5 mg via INTRAVENOUS
  Administered 2016-09-19: 5.5 mg via INTRAVENOUS
  Administered 2016-09-19: 25 mg via INTRAVENOUS
  Filled 2016-09-18 (×2): qty 25

## 2016-09-18 MED ORDER — OXYCODONE HCL 5 MG PO TABS
15.0000 mg | ORAL_TABLET | ORAL | Status: DC | PRN
  Administered 2016-09-18 – 2016-09-19 (×3): 15 mg via ORAL
  Filled 2016-09-18 (×3): qty 3

## 2016-09-18 NOTE — Progress Notes (Signed)
Patient ID: [name], female   DOB: 10-29-1990, 26 y.o.   MRN: [id number] Subjective:  Patient reports improving pain, but she noticed right sided orbital puffiness this morning, no redness, no pain associated, no eye discharge. Patient denies chest pain, denies SOB. She has not started to ambulate but will start today in anticipation for discharge tomorrow.  Objective:  Vital signs in last 24 hours:  Vitals:   09/18/16 0635 09/18/16 1057 09/18/16 1113 09/18/16 1349  BP: 118/76 121/66  111/64  Pulse: 94 100  100  Resp: 14 10 13 14   Temp: 98.5 F (36.9 C) 98.9 F (37.2 C)  98.8 F (37.1 C)  TempSrc: Oral Oral  Oral  SpO2: 91% 99% 95% 93%  Weight: 91.2 kg (201 lb 1 oz)     Height:       Intake/Output from previous day:   Intake/Output Summary (Last 24 hours) at 09/18/16 1443 Last data filed at 09/18/16 1057  Gross per 24 hour  Intake              460 ml  Output             1000 ml  Net             -540 ml   Physical Exam: General: Alert, awake, oriented x3, in no acute distress.  HEENT: [place]/AT PEERL, EOMI Neck: Trachea midline,  no masses, no thyromegal,y no JVD, no carotid bruit OROPHARYNX:  Moist, No exudate/ erythema/lesions.  Heart: Regular rate and rhythm, without murmurs, rubs, gallops, PMI non-displaced, no heaves or thrills on palpation.  Lungs: Clear to auscultation, no wheezing or rhonchi noted. No increased vocal fremitus resonant to percussion  Abdomen: Soft, nontender, nondistended, positive bowel sounds, no masses no hepatosplenomegaly noted..  Neuro: No focal neurological deficits noted cranial nerves II through XII grossly intact. DTRs 2+ bilaterally upper and lower extremities. Strength 5 out of 5 in bilateral upper and lower extremities. Musculoskeletal: No warm swelling or erythema around joints, no spinal tenderness noted. Psychiatric: Patient alert and oriented x3, good insight and cognition, good recent to remote recall. Lymph node survey: No  cervical axillary or inguinal lymphadenopathy noted.  Lab Results:  Basic Metabolic Panel:    Component Value Date/Time   NA 136 09/16/2016 0601   NA 139 05/06/2012 1645   K 3.5 09/16/2016 0601   K 3.9 05/06/2012 1645   CL 103 09/16/2016 0601   CL 110 (H) 05/06/2012 1645   CO2 26 09/16/2016 0601   CO2 22 05/06/2012 1645   BUN 9 09/16/2016 0601   BUN 9 05/06/2012 1645   CREATININE 0.57 09/16/2016 0601   CREATININE 0.43 (L) 07/06/2015 1356   GLUCOSE 115 (H) 09/16/2016 0601   GLUCOSE 84 05/06/2012 1645   CALCIUM 8.6 (L) 09/16/2016 0601   CALCIUM 8.3 (L) 05/06/2012 1645   CBC:    Component Value Date/Time   WBC 13.5 (H) 09/18/2016 0500   HGB 7.3 (L) 09/18/2016 0500   HGB 8.7 (L) 05/11/2012 0721   HCT 20.8 (L) 09/18/2016 0500   HCT 26.9 (L) 05/11/2012 0721   PLT 457 (H) 09/18/2016 0500   PLT 497 (H) 05/11/2012 0721   MCV 84.2 09/18/2016 0500   MCV 94 05/11/2012 0721   NEUTROABS 8.6 (H) 09/18/2016 0500   NEUTROABS 10.9 (H) 05/10/2012 0552   LYMPHSABS 3.0 09/18/2016 0500   LYMPHSABS 3.5 05/10/2012 0552   MONOABS 1.1 (H) 09/18/2016 0500   MONOABS 1.7 (H) 05/10/2012 8338  EOSABS 0.7 09/18/2016 0500   EOSABS 0.8 (H) 05/10/2012 0552   BASOSABS 0.1 09/18/2016 0500   BASOSABS 0.1 05/10/2012 0552    No results found for this or any previous visit (from the past 240 hour(s)).  Studies/Results: No results found.  Medications: Scheduled Meds: . HYDROmorphone   Intravenous Q4H  . hydroxyurea  1,000 mg Oral Daily  . ketorolac  30 mg Intravenous Q6H  . L-glutamine  15 g Oral BID  . morphine  60 mg Oral Q12H  . multivitamin with minerals  1 tablet Oral Daily  . rivaroxaban  20 mg Oral Q supper  . valACYclovir  1,000 mg Oral Daily   Continuous Infusions: . dextrose 5 % and 0.45% NaCl 10 mL/hr at 09/16/16 1137  . diphenhydrAMINE (BENADRYL) IVPB(SICKLE CELL ONLY)     Consultants:  None  Procedures:  None  Antibiotics:  None  Assessment/Plan: Active  Problems:   Hb-SS disease with crisis ([place])   Sickle cell pain crisis ([place])  1. Hb SS with crisis: Will wean off Dilaudid PCA, continue Toradol. KVO. Also continue MS Contin. Anticipate discharge home tomorrow 2. Anemia of Chronic Disease:Hb at baseline, continue to monitor 3. Leukocytosis: No evidence of infection. Likely related to sickle cell crisis. 4. Chronic Anticoagulation:Continue Xarelto  Code Status: Full Code Family Communication: N/A Disposition Plan: Anticipate discharge tomorrow  [name]  If 7PM-7AM, please contact night-coverage.  09/18/2016, 2:43 PM  LOS: 3 days

## 2016-09-18 NOTE — Progress Notes (Signed)
Pt requesting a 1x bolus of Dilaudid  For throbbing pain in Lt leg. Pt states she wants to go home tomorrow. Dr. Doreene Burke called and informed. He denied the bolus, instead will order what pt takes at home for pain.

## 2016-09-19 DIAGNOSIS — D5700 Hb-SS disease with crisis, unspecified: Secondary | ICD-10-CM | POA: Diagnosis not present

## 2016-09-19 DIAGNOSIS — K219 Gastro-esophageal reflux disease without esophagitis: Secondary | ICD-10-CM | POA: Diagnosis not present

## 2016-09-19 DIAGNOSIS — G8929 Other chronic pain: Secondary | ICD-10-CM | POA: Diagnosis not present

## 2016-09-19 DIAGNOSIS — D72829 Elevated white blood cell count, unspecified: Secondary | ICD-10-CM | POA: Diagnosis not present

## 2016-09-19 DIAGNOSIS — Z7901 Long term (current) use of anticoagulants: Secondary | ICD-10-CM | POA: Diagnosis not present

## 2016-09-19 DIAGNOSIS — G894 Chronic pain syndrome: Secondary | ICD-10-CM

## 2016-09-19 DIAGNOSIS — D638 Anemia in other chronic diseases classified elsewhere: Secondary | ICD-10-CM | POA: Diagnosis not present

## 2016-09-19 DIAGNOSIS — M549 Dorsalgia, unspecified: Secondary | ICD-10-CM | POA: Diagnosis not present

## 2016-09-19 LAB — CBC WITH DIFFERENTIAL/PLATELET
BASOS ABS: 0.1 10*3/uL (ref 0.0–0.1)
BASOS PCT: 0 %
EOS ABS: 0.7 10*3/uL (ref 0.0–0.7)
EOS PCT: 6 %
HCT: 20.1 % — ABNORMAL LOW (ref 36.0–46.0)
Hemoglobin: 7.1 g/dL — ABNORMAL LOW (ref 12.0–15.0)
LYMPHS PCT: 26 %
Lymphs Abs: 3.2 10*3/uL (ref 0.7–4.0)
MCH: 30.2 pg (ref 26.0–34.0)
MCHC: 35.3 g/dL (ref 30.0–36.0)
MCV: 85.5 fL (ref 78.0–100.0)
MONO ABS: 1.1 10*3/uL — AB (ref 0.1–1.0)
Monocytes Relative: 9 %
Neutro Abs: 7.4 10*3/uL (ref 1.7–7.7)
Neutrophils Relative %: 59 %
PLATELETS: 469 10*3/uL — AB (ref 150–400)
RBC: 2.35 MIL/uL — AB (ref 3.87–5.11)
RDW: 19.6 % — AB (ref 11.5–15.5)
WBC: 12.5 10*3/uL — AB (ref 4.0–10.5)

## 2016-09-19 LAB — COMPREHENSIVE METABOLIC PANEL
ALBUMIN: 3.7 g/dL (ref 3.5–5.0)
ALT: 25 U/L (ref 14–54)
AST: 23 U/L (ref 15–41)
Alkaline Phosphatase: 66 U/L (ref 38–126)
Anion gap: 5 (ref 5–15)
BUN: 14 mg/dL (ref 6–20)
CHLORIDE: 106 mmol/L (ref 101–111)
CO2: 29 mmol/L (ref 22–32)
CREATININE: 0.61 mg/dL (ref 0.44–1.00)
Calcium: 8.7 mg/dL — ABNORMAL LOW (ref 8.9–10.3)
GFR calc Af Amer: >60 mL/min (ref >60)
Glucose, Bld: 116 mg/dL — ABNORMAL HIGH (ref 65–99)
POTASSIUM: 3.6 mmol/L (ref 3.5–5.1)
SODIUM: 140 mmol/L (ref 135–145)
Total Bilirubin: 2.1 mg/dL — ABNORMAL HIGH (ref 0.3–1.2)
Total Protein: 6.6 g/dL (ref 6.5–8.1)

## 2016-09-19 LAB — RETICULOCYTES
RBC.: 2.39 MIL/uL — ABNORMAL LOW (ref 3.87–5.11)
RETIC CT PCT: 15.7 % — AB (ref 0.4–3.1)
Retic Count, Absolute: 375.2 10*3/uL — ABNORMAL HIGH (ref 19.0–186.0)

## 2016-09-19 MED ORDER — POLYETHYLENE GLYCOL 3350 17 G PO PACK: 17.0000 g | PACK | Freq: Every day | ORAL | Status: DC | PRN

## 2016-09-19 MED ORDER — SENNOSIDES-DOCUSATE SODIUM 8.6-50 MG PO TABS
1.0000 | ORAL_TABLET | Freq: Two times a day (BID) | ORAL | Status: DC
  Administered 2016-09-19: 1 via ORAL

## 2016-09-19 MED ORDER — HEPARIN SOD (PORK) LOCK FLUSH 100 UNIT/ML IV SOLN
500.0000 [IU] | INTRAVENOUS | Status: DC | PRN
  Filled 2016-09-19 (×2): qty 5

## 2016-09-19 NOTE — Progress Notes (Signed)
Discharge instructions explained to pt. Port flushed with 10 ml NS and 500 units of heparin. Mother in to take pt home.

## 2016-09-19 NOTE — Care Management Important Message (Signed)
Important Message  Patient Details  Name: [name] MRN: [id number] Date of Birth: [birth year]   Medicare Important Message Given:  Yes    Kerin Salen 09/19/2016, 11:17 AMImportant Message  Patient Details  Name: [name] MRN: [id number] Date of Birth: 06/15/1990   Medicare Important Message Given:  Yes    Kerin Salen 09/19/2016, 11:16 AM

## 2016-09-19 NOTE — Discharge Summary (Signed)
[name] MRN: [id number] DOB/AGE: [birth year] 25 y.o.  Admit date: 09/15/2016 Discharge date: 09/19/2016  Primary Care Physician:  Ricke Hey, MD   Discharge Diagnoses:   Patient Active Problem List   Diagnosis Date Noted  . Major depression, chronic 01/06/2011    Priority: High  . Sickle cell disease ([place]) 01/08/2009    Priority: High  . Hb-SS disease with crisis ([place]) 08/20/2016  . Chronic anticoagulation   . Sickle cell anemia ([place]) 05/19/2016  . History of DVT (deep vein thrombosis) 04/17/2016  . Thrombocytosis ([place]) 11/09/2015  . Hyperbilirubinemia 11/09/2015  . Chronic pain 08/04/2015  . Herpes simplex 07/14/2015  . Anemia of chronic disease   . Hb-SS disease without crisis ([place]) 02/26/2013  . 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     Latex Rash      Medication List    TAKE these medications   Biotin 1 MG Caps Take 2 mg by mouth daily.   [employer] DAILY ENERGY Tabs Take 2 tablets by mouth daily.   hydroxyurea 500 MG capsule Commonly known as:  HYDREA Take 2 capsules (1,000 mg total) by mouth daily. May take with food to minimize GI side effects.   morphine 60 MG 12 hr tablet Commonly known as:  MS CONTIN Take 60 mg by mouth every 12 (twelve) hours as needed for pain.   oxyCODONE 15 MG immediate release tablet Commonly known as:  ROXICODONE Take 15 mg by mouth every 4 (four) hours as needed for pain.   rivaroxaban 20 MG Tabs tablet Commonly known as:  XARELTO Take 20 mg by mouth daily with supper.   valACYclovir 1000 MG tablet Commonly known as:  VALTREX Take 1 tablet (1,000 mg total) by mouth daily.         Consults:    SIGNIFICANT DIAGNOSTIC STUDIES:  No results found.    No results found for this or any previous visit (from the past 240 hour(s)).  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 She says that pain is typical of her sickle cell crisis. Pain intensity is 10/10 and localized to lower extremities. She transitioned to the sickle cell day infusion center for extended observation and pain management. She was treated with high concentration PCA dilaudid with a 1 mg loading dose. Ms. Nader used a total of 15.3 mg of dilaudid in the day infusion center without sustained relief. Pain remains uncontrolled. She is describing pain as constant and throbbing. Ms. Sirmon will be admitted to inpatient services for a higher level of care.    Hospital Course:  Present on Admission: . Hb-SS disease with crisis ([place]) Pt was admitted with a sickle cell crisis. Her pain was managed with Dilaudid via PCA, Toradol and IVF.  MS Contin was continued for management of Chronic Pain as was Xarelto for chronic anticoagulation. As the pain was improved, transition was made to oral analgesics. At the time of discharge her pain was at a level of 4/10 and she was able to ambulate without difficulty. He Hb remained stable and transfusion was not required during hospitalization.    Disposition and Follow-up: Discharged home in good condition and is to follow up with her PMD in 1 week.  Discharge Instructions    Activity as tolerated - No restrictions    Complete by:  As directed    Diet general    Complete by:  As directed       DISCHARGE EXAM:  General: Alert, awake, oriented x3, in no apparent distress.  HEENT: [place]/AT PEERL, EOMI, anicteric Neck: Trachea midline, no masses, no thyromegal,y no JVD, no carotid bruit OROPHARYNX: Moist, No exudate/ erythema/lesions.  Heart: Regular rate and rhythm, without murmurs, rubs, gallops or S3. PMI non-displaced. Exam reveals no decreased pulses. Pulmonary/Chest: Normal effort. Breath sounds normal. No. Apnea. Clear to  auscultation,no stridor,  no wheezing and no rhonchi noted. No respiratory distress and no tenderness noted. Abdomen: Soft, nontender, nondistended, normal bowel sounds, no masses no hepatosplenomegaly noted. No fluid wave and no ascites. There is no guarding or rebound. Neuro: Alert and oriented to person, place and time. Normal motor skills, Displays no atrophy or tremors and exhibits normal muscle tone.  No focal neurological deficits noted cranial nerves II through XII grossly intact. No sensory deficit noted.  Strength at baseline in bilateral upper and lower extremities. Gait normal. Musculoskeletal: No warmth swelling or erythema around joints, no spinal tenderness noted. Psychiatric: Patient alert and oriented x3, good insight and cognition, good recent to remote recall. Mood, affect and judgement normal Lymph node survey: No cervical axillary or inguinal lymphadenopathy noted. Skin: Skin is warm and dry. No bruising, no ecchymosis and no rash noted. Pt is not diaphoretic. No erythema. No pallor   Blood pressure 116/73, pulse 100, temperature 98.4 F (36.9 C), temperature source Oral, resp. rate 14, height 5\' 1"  (1.549 m), weight 94.7 kg (208 lb 12.4 oz), SpO2 99 %, not currently breastfeeding.   Recent Labs  09/19/16 0343  NA 140  K 3.6  CL 106  CO2 29  GLUCOSE 116*  BUN 14  CREATININE 0.61  CALCIUM 8.7*    Recent Labs  09/19/16 0343  AST 23  ALT 25  ALKPHOS 66  BILITOT 2.1*  PROT 6.6  ALBUMIN 3.7   No results for input(s): LIPASE, AMYLASE in the last 72 hours.  Recent Labs  09/18/16 0500 09/19/16 0343  WBC 13.5* 12.5*  NEUTROABS 8.6* 7.4  HGB 7.3* 7.1*  HCT 20.8* 20.1*  MCV 84.2 85.5  PLT 457* 469*     Total time spent including face to face and decision making was greater than 30 minutes  Signed: Lamiya Naas A. 09/19/2016, 3:19 PM

## 2016-09-21 ENCOUNTER — Other Ambulatory Visit (HOSPITAL_COMMUNITY)
Admission: RE | Admit: 2016-09-21 | Discharge: 2016-09-21 | Disposition: A | Discharge status: Home / Self Care | Payer: Medicare Other | Source: Ambulatory Visit | Attending: Obstetrics | Admitting: Obstetrics

## 2016-09-21 ENCOUNTER — Ambulatory Visit (INDEPENDENT_AMBULATORY_CARE_PROVIDER_SITE_OTHER): Payer: Medicare Other | Admitting: Obstetrics

## 2016-09-21 ENCOUNTER — Encounter: Payer: Self-pay | Admitting: Obstetrics

## 2016-09-21 VITALS — BP 122/85 | HR 86 | Ht 62.0 in | Wt 198.8 lb

## 2016-09-21 DIAGNOSIS — Z01419 Encounter for gynecological examination (general) (routine) without abnormal findings: Secondary | ICD-10-CM | POA: Diagnosis not present

## 2016-09-21 DIAGNOSIS — Z3009 Encounter for other general counseling and advice on contraception: Secondary | ICD-10-CM

## 2016-09-21 DIAGNOSIS — Z3202 Encounter for pregnancy test, result negative: Secondary | ICD-10-CM

## 2016-09-21 DIAGNOSIS — R87620 Atypical squamous cells of undetermined significance on cytologic smear of vagina (ASC-US): Secondary | ICD-10-CM | POA: Diagnosis not present

## 2016-09-21 DIAGNOSIS — Z124 Encounter for screening for malignant neoplasm of cervix: Secondary | ICD-10-CM

## 2016-09-21 DIAGNOSIS — N898 Other specified noninflammatory disorders of vagina: Secondary | ICD-10-CM

## 2016-09-21 DIAGNOSIS — N944 Primary dysmenorrhea: Secondary | ICD-10-CM

## 2016-09-21 DIAGNOSIS — Z862 Personal history of diseases of the blood and blood-forming organs and certain disorders involving the immune mechanism: Secondary | ICD-10-CM

## 2016-09-21 LAB — POCT URINE PREGNANCY: PREG TEST UR: NEGATIVE

## 2016-09-21 MED ORDER — MEDROXYPROGESTERONE ACETATE 150 MG/ML IM SUSP: 150.0000 mg | INTRAMUSCULAR | 3 refills | Status: DC

## 2016-09-21 NOTE — Progress Notes (Signed)
Last Pap 05/2014 Normal Pt would like to discuss birth control

## 2016-09-21 NOTE — Progress Notes (Signed)
Subjective:        [name] is a 26 y.o. female here for a routine exam.  Current complaints: None.    Personal health questionnaire:  Is patient Ashkenazi Jewish, have a family history of breast and/or ovarian cancer: no Is there a family history of uterine cancer diagnosed at age < 36, gastrointestinal cancer, urinary tract cancer, family member who is a Field seismologist syndrome-associated carrier: no Is the patient overweight and hypertensive, family history of diabetes, personal history of gestational diabetes, preeclampsia or PCOS: no Is patient over 73, have PCOS,  family history of premature CHD under age 11, diabetes, smoke, have hypertension or peripheral artery disease:  no At any time, has a partner hit, kicked or otherwise hurt or frightened you?: no Over the past 2 weeks, have you felt down, depressed or hopeless?: no Over the past 2 weeks, have you felt little interest or pleasure in doing things?:no   Gynecologic History Patient's last menstrual period was 08/29/2016. Contraception: none Last Pap: 2016. Results were: normal Last mammogram: n/a. Results were: n/a  Obstetric History OB History  Gravida Para Term Preterm AB Living  5 2 2  0 3 2  SAB TAB Ectopic Multiple Live Births  3 0 0 0 2    # Outcome Date GA Lbr Len/2nd Weight Sex Delivery Anes PTL Lv  5 Term 10/31/15 [redacted]w[redacted]d  6 lb 15.8 oz (3.17 kg) F Vag-Spont EPI, Other  LIV  4 Term 12/07/14 [redacted]w[redacted]d / 00:08 5 lb 4.1 oz (2.384 kg) M Vag-Spont EPI  LIV  3 SAB 02/2012          2 SAB 08/2011          1 SAB 02/20/11             Birth Comments: System Generated. Please review and update pregnancy details.    Obstetric Comments  Miscarried in October 2012 at about 7 weeks    Past Medical History:  Diagnosis Date  . Blood transfusion    "lots"  . Blood transfusion without reported diagnosis   . Chronic back pain    "very severe; have knot in my back; from tight muscle; take RX and exercise for it"  .  Depression 01/06/2011  . Exertional dyspnea    "sometimes"  . Genital HSV   . GERD (gastroesophageal reflux disease) 02/17/2011  . Migraines 11/08/11   "@ least twice/month"  . 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  . Mood swings ([place]) 11/08/11   "I go back and forth; real bad"  . Sickle cell anemia ([name])   . Sickle cell anemia with crisis ([place])   . Trichotillomania    h/o    Past Surgical History:  Procedure Laterality Date  . CHOLECYSTECTOMY  05/2010  . DILATION AND CURETTAGE OF UTERUS  02/20/11   S/P miscarriage  . IR GENERIC HISTORICAL  12/23/2015   IR FLUORO GUIDE CV LINE RIGHT 12/23/2015 Jacqulynn Cadet, MD [facility]  . IR GENERIC HISTORICAL  12/23/2015   IR US GUIDE VASC ACCESS RIGHT 12/23/2015 Jacqulynn Cadet, MD [facility]     Current Outpatient Prescriptions:  .  Biotin 1 MG CAPS, Take 2 mg by mouth daily., Disp: , Rfl:  .  hydroxyurea (HYDREA) 500 MG capsule, Take 2 capsules (1,000 mg total) by mouth daily. May take with food to minimize GI side effects., Disp: 180 capsule, Rfl: 3 .  morphine (MS CONTIN) 60 MG 12 hr tablet, Take 60 mg  by mouth every 12 (twelve) hours as needed for pain. , Disp: , Rfl:  .  Multiple Vitamins-Minerals ([employer] DAILY ENERGY) TABS, Take 2 tablets by mouth daily., Disp: , Rfl:  .  oxyCODONE (ROXICODONE) 15 MG immediate release tablet, Take 15 mg by mouth every 4 (four) hours as needed for pain. , Disp: , Rfl:  .  rivaroxaban (XARELTO) 20 MG TABS tablet, Take 20 mg by mouth daily with supper. , Disp: , Rfl:  .  valACYclovir (VALTREX) 1000 MG tablet, Take 1 tablet (1,000 mg total) by mouth daily., Disp: 30 tablet, Rfl: 1 Allergies  Allergen Reactions  . Food Hives and Other (See Comments)    Pt is allergic to carrots.    . Latex Rash    Social History  Substance Use Topics  . Smoking status: Former Smoker    Packs/day: 0.25    Years: 1.00    Types: Cigarettes    Quit date: 03/25/2013  . Smokeless tobacco: Never Used  . Alcohol  use Yes     Comment: Twice a month    Family History  Problem Relation Age of Onset  . Sickle cell trait Mother   . Sickle cell trait Father   . Diabetes Maternal Grandmother   . Diabetes Paternal Grandmother   . Hypertension Paternal Grandmother   . Diabetes Maternal Grandfather       Review of Systems  Constitutional: negative for fatigue and weight loss Respiratory: negative for cough and wheezing Cardiovascular: negative for chest pain, fatigue and palpitations Gastrointestinal: negative for abdominal pain and change in bowel habits Musculoskeletal:negative for myalgias Neurological: negative for gait problems and tremors Behavioral/Psych: negative for abusive relationship, depression Endocrine: negative for temperature intolerance    Genitourinary:negative for abnormal menstrual periods, genital lesions, hot flashes, sexual problems and vaginal discharge Integument/breast: negative for breast lump, breast tenderness, nipple discharge and skin lesion(s)    Objective:       BP 122/85   Pulse 86   Ht 5\' 2"  (1.575 m)   Wt 198 lb 12.8 oz (90.2 kg)   LMP 08/29/2016   BMI 36.36 kg/m  General:   alert  Skin:   no rash or abnormalities  Lungs:   clear to auscultation bilaterally  Heart:   regular rate and rhythm, S1, S2 normal, no murmur, click, rub or gallop  Breasts:   normal without suspicious masses, skin or nipple changes or axillary nodes  Abdomen:  normal findings: no organomegaly, soft, non-tender and no hernia  Pelvis:  External genitalia: normal general appearance Urinary system: urethral meatus normal and bladder without fullness, nontender Vaginal: normal without tenderness, induration or masses Cervix: normal appearance Adnexa: normal bimanual exam Uterus: anteverted and non-tender, normal size   Lab Review Urine pregnancy test Labs reviewed yes Radiologic studies reviewed no  50% of 20 min visit spent on counseling and coordination of care.     Assessment:    Healthy female exam.    History of Sickle Cell Anemia, stable  Contraceptive Counseling and Advice.  Depo Provera recommended.  Should avoid all Estrogen containing contraceptives.   Plan:   Depo Provera Rx  Education reviewed: calcium supplements, depression evaluation, low fat, low cholesterol diet, safe sex/STD prevention, self breast exams and weight bearing exercise. Contraception: Depo-Provera injections. Follow up in: 1 year.   No orders of the defined types were placed in this encounter.  No orders of the defined types were placed in this encounter.     Patient ID: Odette Horns  Loretha Brasil, female   DOB: [birth year], 26 y.o.   MRN: [id number]

## 2016-09-22 ENCOUNTER — Other Ambulatory Visit: Payer: Self-pay | Admitting: Obstetrics

## 2016-09-22 DIAGNOSIS — A549 Gonococcal infection, unspecified: Secondary | ICD-10-CM

## 2016-09-22 LAB — CERVICOVAGINAL ANCILLARY ONLY
Bacterial vaginitis: NEGATIVE
CANDIDA VAGINITIS: NEGATIVE
CHLAMYDIA, DNA PROBE: NEGATIVE
Neisseria Gonorrhea: POSITIVE — AB
TRICH (WINDOWPATH): NEGATIVE

## 2016-09-22 IMAGING — CR DG CHEST 2V
2 series · 2 of 2 positions shown · non-contrast
Comparison: None

CLINICAL DATA: 24-year-old female with sickle cell crisis
presenting with chest pain and shortness of breath

EXAM:
CHEST  2 VIEW

[w chest lat]
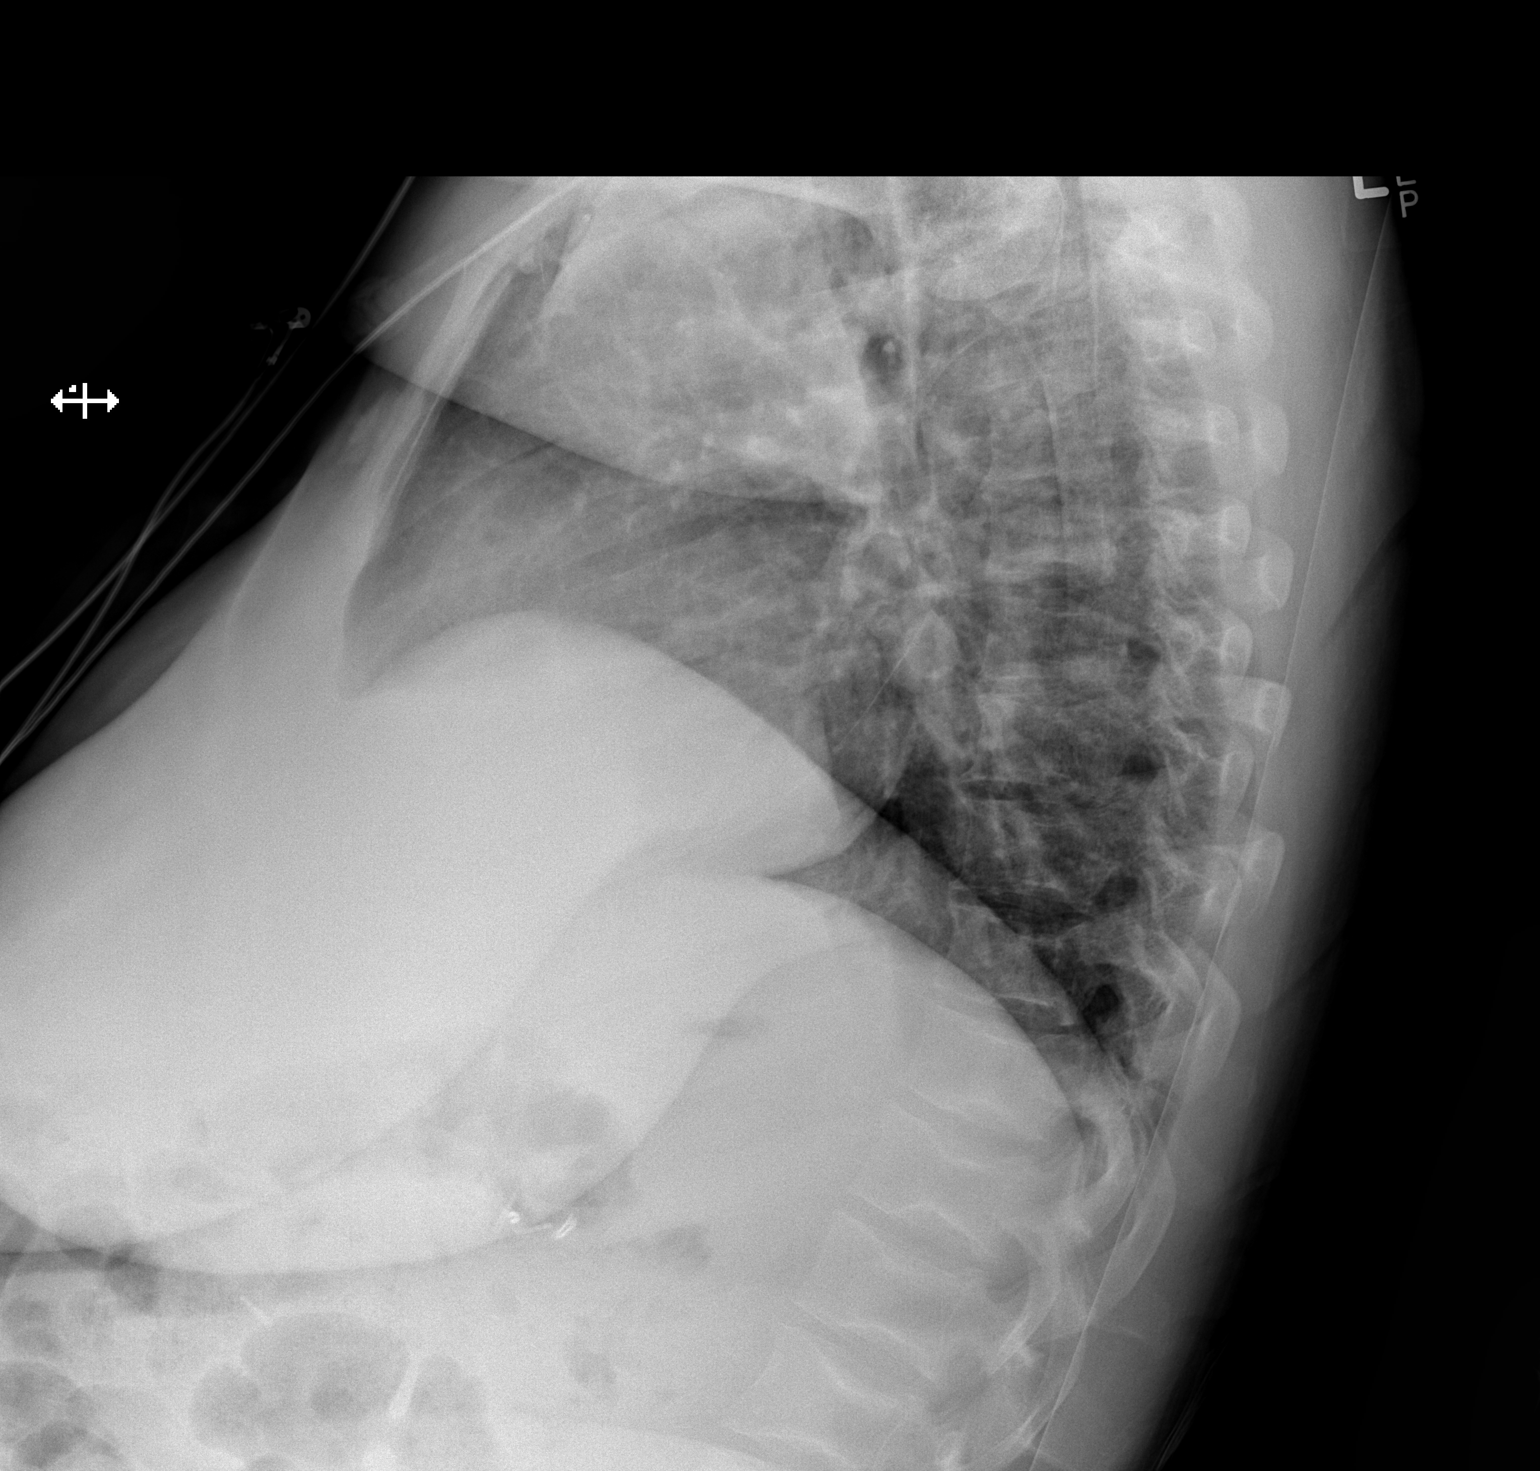

[x chest ap]
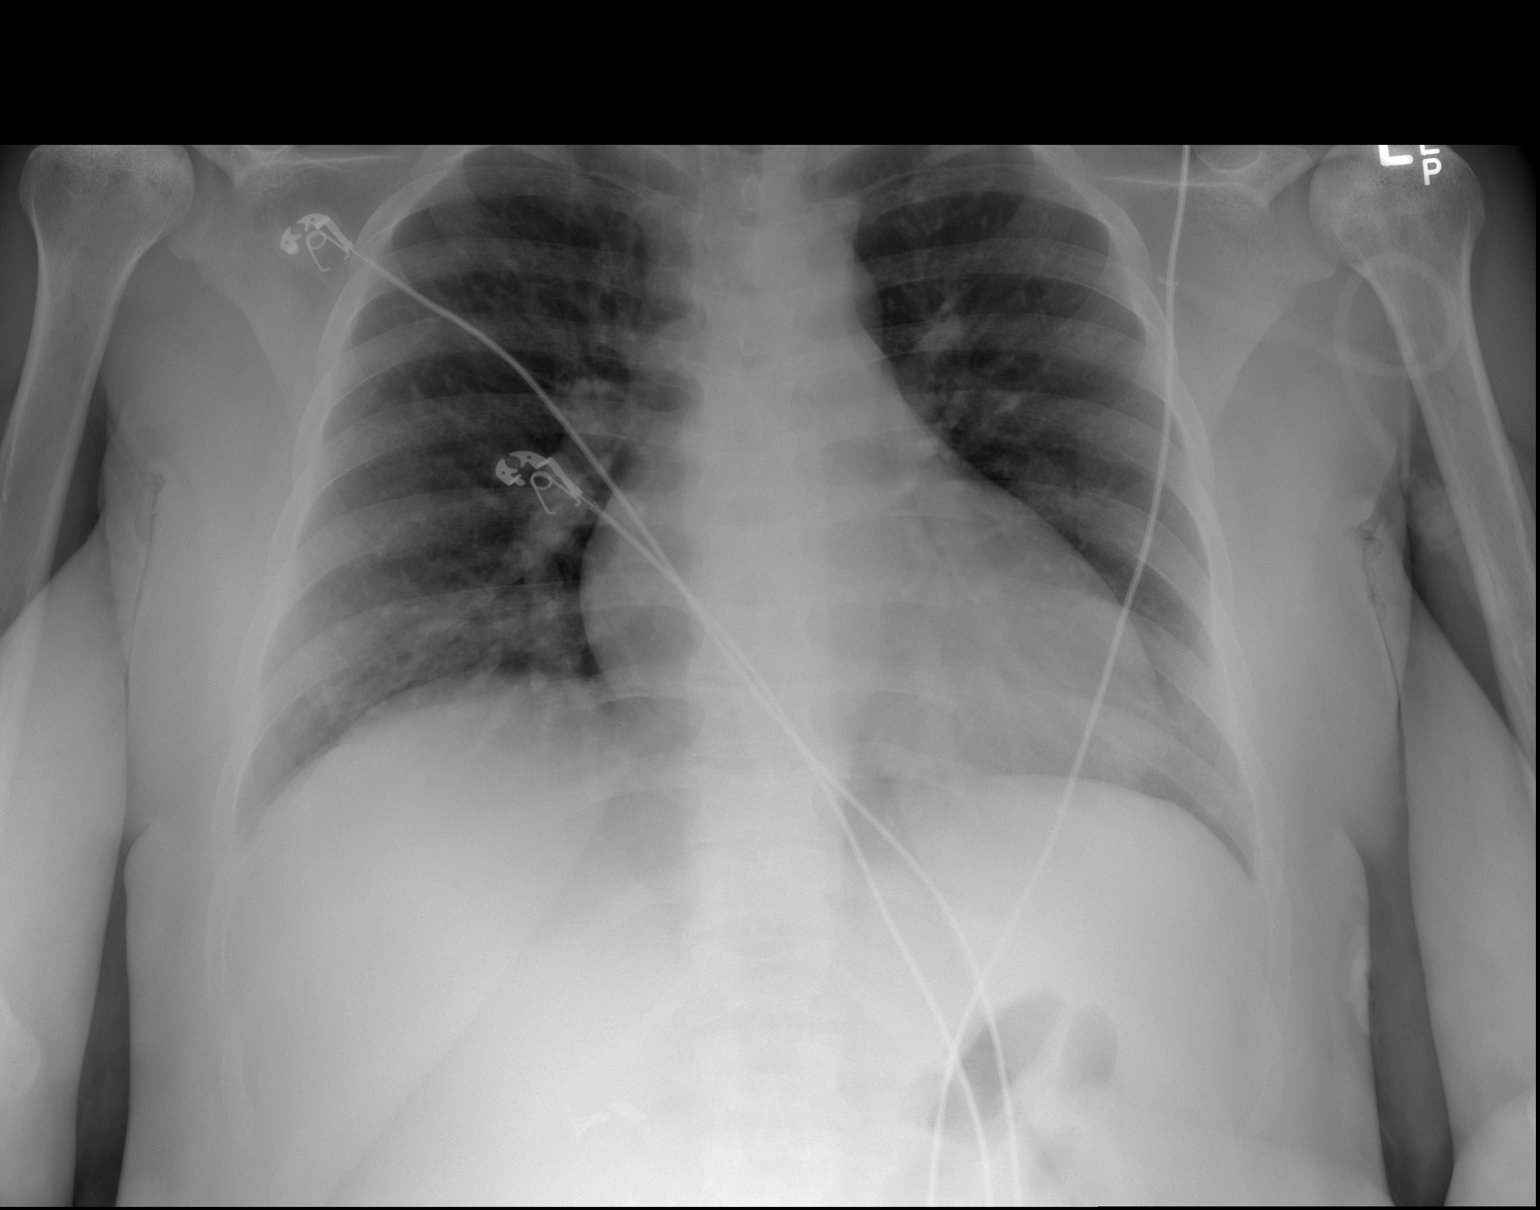

[2 of 2 positions shown; findings below may reference images not displayed]

FINDINGS: Two views of the chest do not demonstrate a focal consolidation.
There is no pleural effusion or pneumothorax. Top-normal cardiac
silhouette. There are sclerotic changes of the humeral heads
bilaterally compatible with avascular necrosis. There is central
depression of the vertebral body endplates with H shaped vertebra
compatible with chronic changes of sickle cell. No acute fracture.
IMPRESSION: No acute cardiopulmonary process.

## 2016-09-22 MED ORDER — AZITHROMYCIN 500 MG PO TABS: 1000.0000 mg | ORAL_TABLET | Freq: Once | ORAL | 0 refills | Status: AC

## 2016-09-22 MED ORDER — CEFTRIAXONE SODIUM 250 MG IJ SOLR: 250.0000 mg | Freq: Once | INTRAMUSCULAR | Status: DC

## 2016-09-23 ENCOUNTER — Ambulatory Visit: Payer: Self-pay

## 2016-09-23 LAB — CYTOLOGY - PAP
DIAGNOSIS: UNDETERMINED — AB
HPV (WINDOPATH): NOT DETECTED

## 2016-09-23 IMAGING — CT CT ANGIO CHEST
2 of 6 series · 19 of 36 positions shown · IV contrast (ISOVUE 370)
Comparison: Radiographs 11/08/2015

CLINICAL DATA: Chest pain. Sickle cell disease. Two weeks
postpartum. Leukocytosis.

EXAM:
CT ANGIOGRAPHY CHEST WITH CONTRAST
TECHNIQUE: Multidetector CT imaging of the chest was performed using the
standard protocol during bolus administration of intravenous
contrast. Multiplanar CT image reconstructions and MIPs were
obtained to evaluate the vascular anatomy.
CONTRAST:  100 mL Isovue 370 intravenous

[Series 5: thins for pacs · axial · 0.59mm/px · z∈[+286,+466]mm · 18 of 202 slices shown]
[im 11/202  lung]
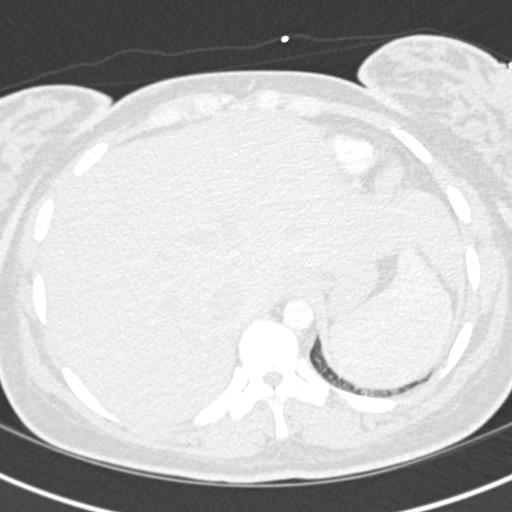
[im 21/202  mediastinal]
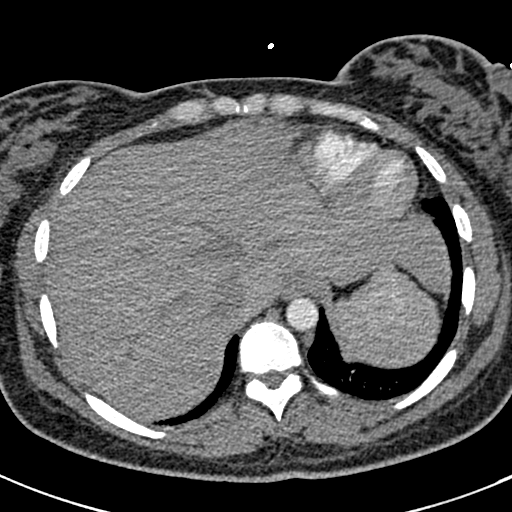
[im 31/202  lung]
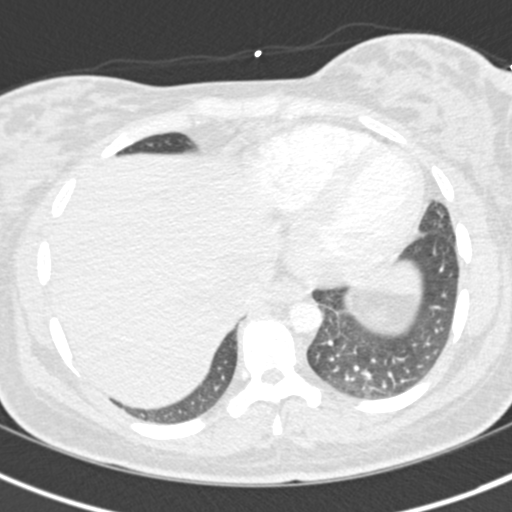
[im 41/202  mediastinal]
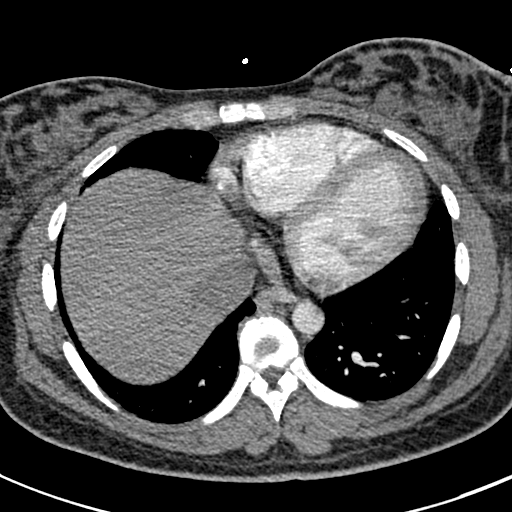
[im 51/202  lung]
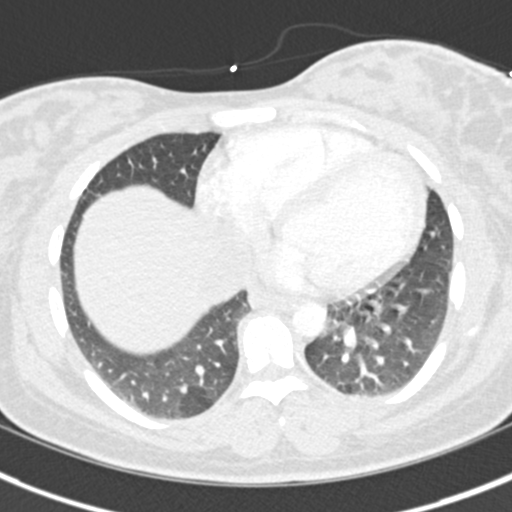
[im 61/202  mediastinal]
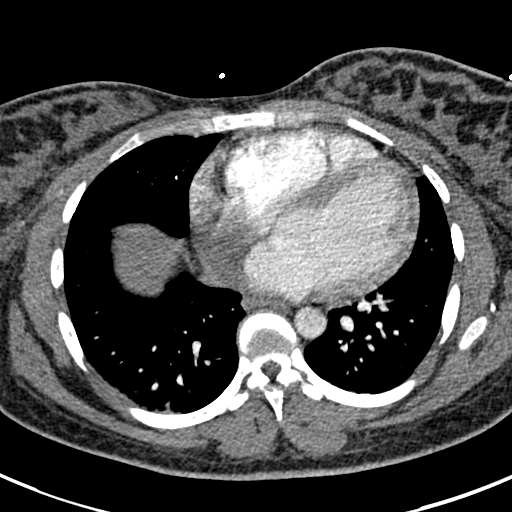
[im 71/202  lung]
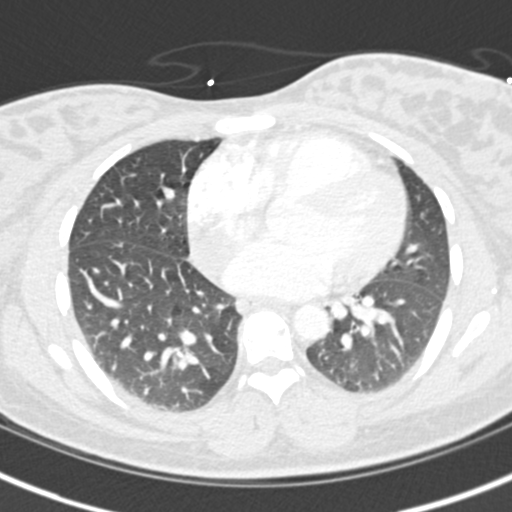
[im 81/202  mediastinal]
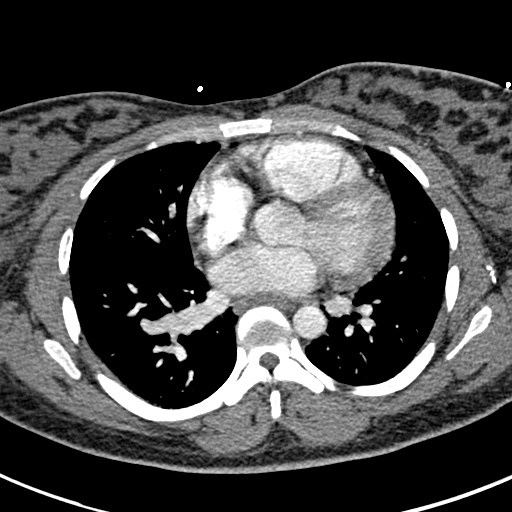
[im 91/202  lung]
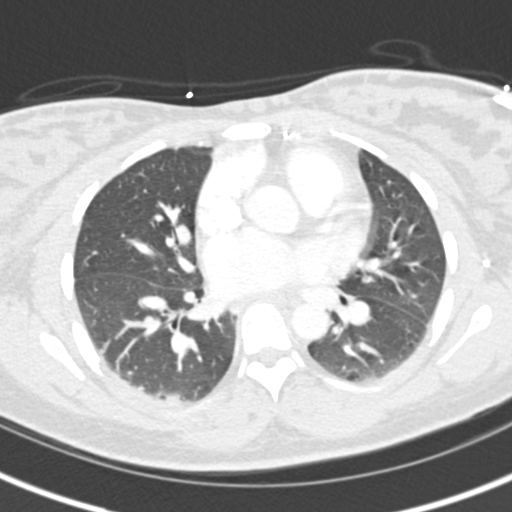
[im 111/202  mediastinal]
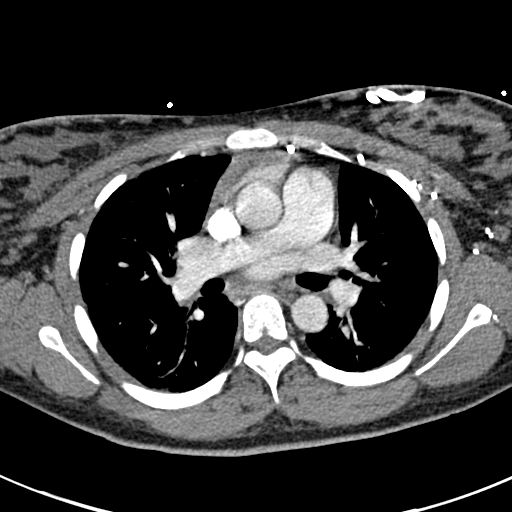
[im 121/202  lung]
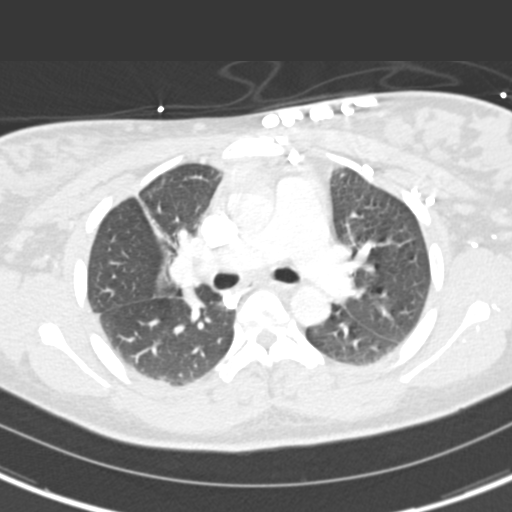
[im 131/202  mediastinal]
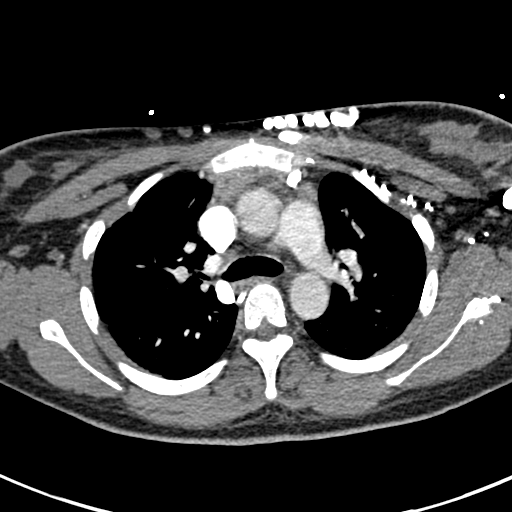
[im 141/202  lung]
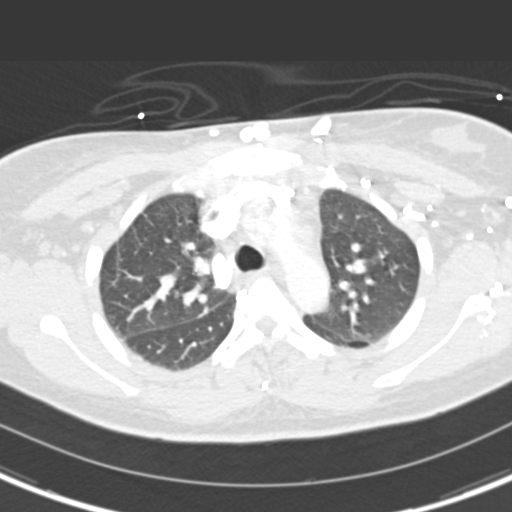
[im 151/202  mediastinal]
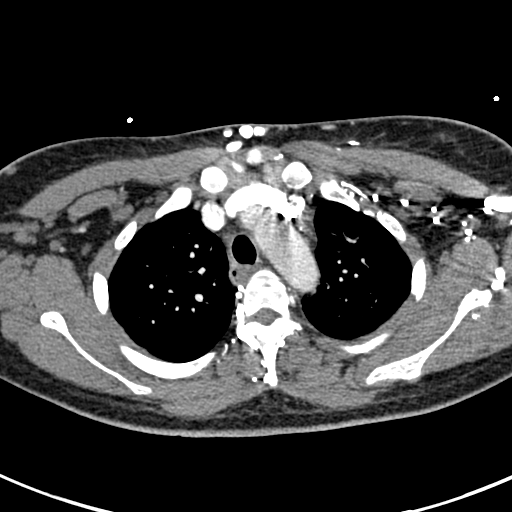
[im 161/202  lung]
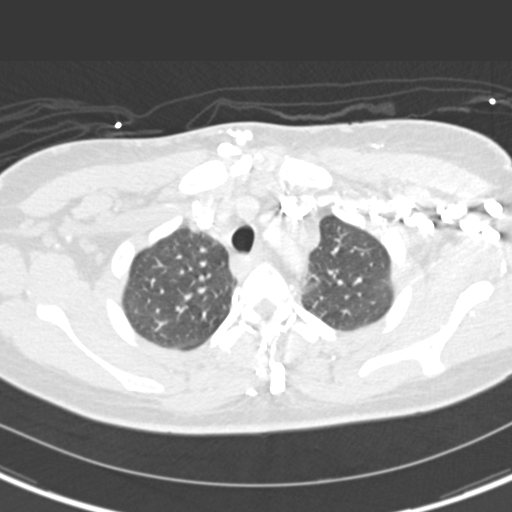
[im 171/202  mediastinal]
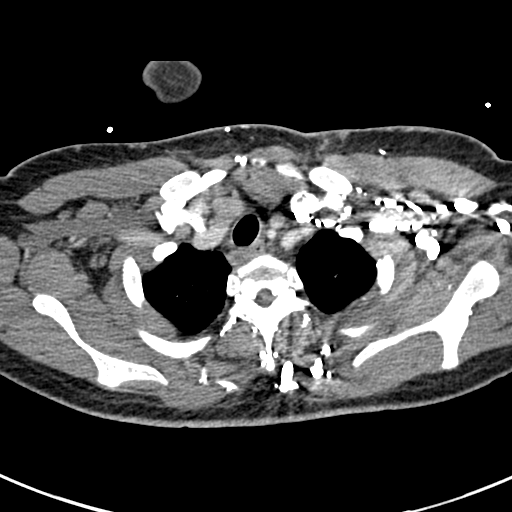
[im 181/202  lung]
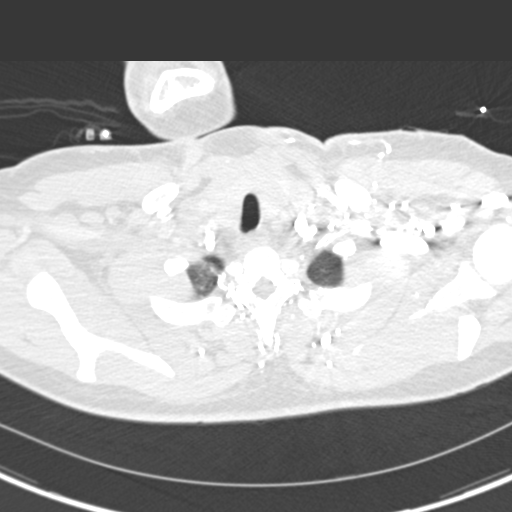
[im 191/202  mediastinal]
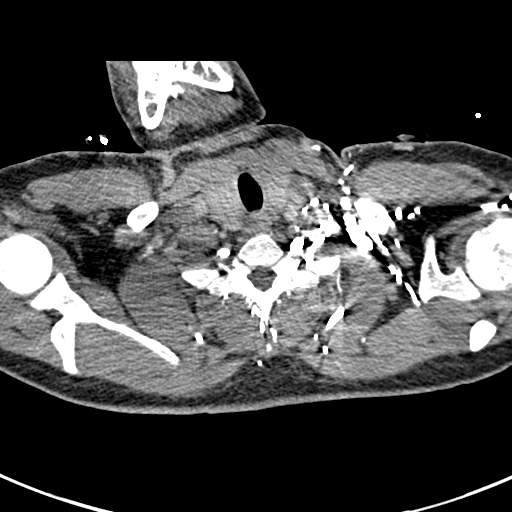

[Series 7: coronal mpr · coronal · 0.39mm/px · 1 of 106 slices shown]
[im 53/106  mediastinal]
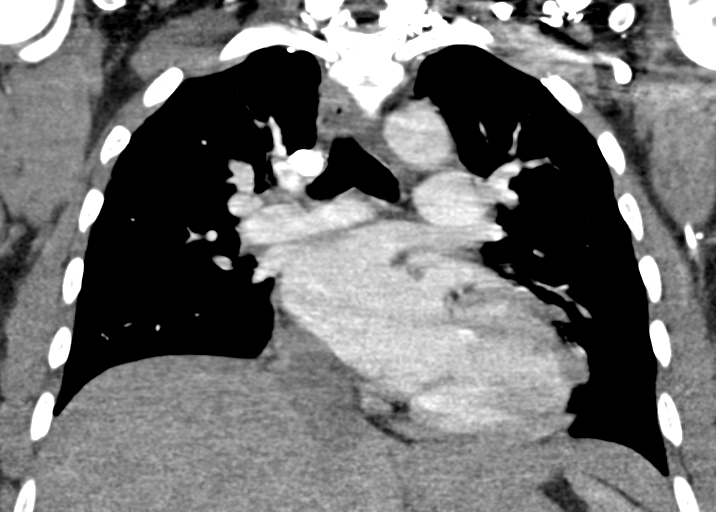

[19 of 36 positions shown; findings below may reference images not displayed]

FINDINGS: Cardiovascular: Suboptimal opacification of the pulmonary
vasculature. No large or central pulmonary emboli. No peripheral
emboli are evident, but sensitivity for detection is reduced.

Lungs: Clear except for minimal atelectatic appearing posterior lung
base opacities. Mild linear scarring in the right middle lobe.

Central airways: Patent and normal in caliber

Effusions: None

Lymphadenopathy: None

Esophagus: Unremarkable

Upper abdomen: No significant abnormality

Musculoskeletal: Sharply delimited central endplate depressions
resulting in H-shaped vertebral bodies, a known association with
sickle cell disease, due to microvascular endplate ischemia.

Review of the MIP images confirms the above findings.
IMPRESSION: Negative for acute pulmonary embolism although there is reduced
sensitivity for detection of small peripheral emboli due to
suboptimal pulmonary arterial opacification. No acute findings.

## 2016-09-29 ENCOUNTER — Ambulatory Visit: Payer: Self-pay

## 2016-09-29 ENCOUNTER — Telehealth: Payer: Self-pay | Admitting: *Deleted

## 2016-09-29 NOTE — Telephone Encounter (Signed)
Pt has no showed 2 appt for Rocephin injection.  Call placed to pt.  No answer, LM on VM making pt aware it's important for her to keep appt and to call office to reschedule.

## 2016-10-02 ENCOUNTER — Inpatient Hospital Stay (HOSPITAL_COMMUNITY)
Admission: EM | Admit: 2016-10-02 | Discharge: 2016-10-05 | DRG: 812 | Disposition: A | Discharge status: Home / Self Care | Payer: Medicare Other | Attending: Internal Medicine | Admitting: Internal Medicine

## 2016-10-02 ENCOUNTER — Encounter (HOSPITAL_COMMUNITY): Payer: Self-pay | Admitting: Emergency Medicine

## 2016-10-02 DIAGNOSIS — K219 Gastro-esophageal reflux disease without esophagitis: Secondary | ICD-10-CM | POA: Diagnosis present

## 2016-10-02 DIAGNOSIS — D5700 Hb-SS disease with crisis, unspecified: Secondary | ICD-10-CM | POA: Diagnosis not present

## 2016-10-02 DIAGNOSIS — G8929 Other chronic pain: Secondary | ICD-10-CM | POA: Diagnosis present

## 2016-10-02 DIAGNOSIS — Z7901 Long term (current) use of anticoagulants: Secondary | ICD-10-CM

## 2016-10-02 DIAGNOSIS — Z87891 Personal history of nicotine dependence: Secondary | ICD-10-CM

## 2016-10-02 DIAGNOSIS — D72829 Elevated white blood cell count, unspecified: Secondary | ICD-10-CM | POA: Diagnosis present

## 2016-10-02 DIAGNOSIS — M549 Dorsalgia, unspecified: Secondary | ICD-10-CM | POA: Diagnosis not present

## 2016-10-02 DIAGNOSIS — M879 Osteonecrosis, unspecified: Secondary | ICD-10-CM | POA: Diagnosis not present

## 2016-10-02 DIAGNOSIS — M545 Low back pain: Secondary | ICD-10-CM | POA: Diagnosis present

## 2016-10-02 DIAGNOSIS — Z91018 Allergy to other foods: Secondary | ICD-10-CM | POA: Diagnosis not present

## 2016-10-02 DIAGNOSIS — Z91040 Latex allergy status: Secondary | ICD-10-CM

## 2016-10-02 DIAGNOSIS — G894 Chronic pain syndrome: Secondary | ICD-10-CM | POA: Diagnosis not present

## 2016-10-02 DIAGNOSIS — D638 Anemia in other chronic diseases classified elsewhere: Secondary | ICD-10-CM | POA: Diagnosis present

## 2016-10-02 DIAGNOSIS — Z79899 Other long term (current) drug therapy: Secondary | ICD-10-CM

## 2016-10-02 DIAGNOSIS — Z86718 Personal history of other venous thrombosis and embolism: Secondary | ICD-10-CM | POA: Diagnosis not present

## 2016-10-02 DIAGNOSIS — M25511 Pain in right shoulder: Secondary | ICD-10-CM | POA: Diagnosis not present

## 2016-10-02 DIAGNOSIS — Z9119 Patient's noncompliance with other medical treatment and regimen: Secondary | ICD-10-CM | POA: Diagnosis not present

## 2016-10-02 DIAGNOSIS — A6000 Herpesviral infection of urogenital system, unspecified: Secondary | ICD-10-CM | POA: Diagnosis present

## 2016-10-02 LAB — CBC WITH DIFFERENTIAL/PLATELET
Basophils Absolute: 0 10*3/uL (ref 0.0–0.1)
Basophils Relative: 0 %
Eosinophils Absolute: 0.2 10*3/uL (ref 0.0–0.7)
Eosinophils Relative: 1 %
HEMATOCRIT: 26.6 % — AB (ref 36.0–46.0)
HEMOGLOBIN: 9.1 g/dL — AB (ref 12.0–15.0)
LYMPHS ABS: 1.3 10*3/uL (ref 0.7–4.0)
LYMPHS PCT: 10 %
MCH: 28.9 pg (ref 26.0–34.0)
MCHC: 34.2 g/dL (ref 30.0–36.0)
MCV: 84.4 fL (ref 78.0–100.0)
MONO ABS: 1 10*3/uL (ref 0.1–1.0)
MONOS PCT: 8 %
NEUTROS PCT: 81 %
Neutro Abs: 9.9 10*3/uL — ABNORMAL HIGH (ref 1.7–7.7)
Platelets: 516 10*3/uL — ABNORMAL HIGH (ref 150–400)
RBC: 3.15 MIL/uL — ABNORMAL LOW (ref 3.87–5.11)
RDW: 18.8 % — ABNORMAL HIGH (ref 11.5–15.5)
WBC: 12.4 10*3/uL — ABNORMAL HIGH (ref 4.0–10.5)

## 2016-10-02 LAB — RETICULOCYTES
RBC.: 3.15 MIL/uL — ABNORMAL LOW (ref 3.87–5.11)
RETIC CT PCT: 12.1 % — AB (ref 0.4–3.1)
Retic Count, Absolute: 381.2 10*3/uL — ABNORMAL HIGH (ref 19.0–186.0)

## 2016-10-02 LAB — COMPREHENSIVE METABOLIC PANEL
ALBUMIN: 4.2 g/dL (ref 3.5–5.0)
ALK PHOS: 62 U/L (ref 38–126)
ALT: 15 U/L (ref 14–54)
ANION GAP: 3 — AB (ref 5–15)
AST: 17 U/L (ref 15–41)
BILIRUBIN TOTAL: 2.1 mg/dL — AB (ref 0.3–1.2)
BUN: 7 mg/dL (ref 6–20)
CALCIUM: 8.6 mg/dL — AB (ref 8.9–10.3)
CO2: 25 mmol/L (ref 22–32)
Chloride: 108 mmol/L (ref 101–111)
Creatinine, Ser: 0.44 mg/dL (ref 0.44–1.00)
GFR calc Af Amer: >60 mL/min (ref >60)
GLUCOSE: 100 mg/dL — AB (ref 65–99)
Potassium: 3.8 mmol/L (ref 3.5–5.1)
Sodium: 136 mmol/L (ref 135–145)
TOTAL PROTEIN: 7.2 g/dL (ref 6.5–8.1)

## 2016-10-02 MED ORDER — DIPHENHYDRAMINE HCL 50 MG/ML IJ SOLN
25.0000 mg | Freq: Once | INTRAMUSCULAR | Status: AC
  Administered 2016-10-02: 25 mg via INTRAVENOUS
  Filled 2016-10-02: qty 1

## 2016-10-02 MED ORDER — SODIUM CHLORIDE 0.9% FLUSH: 9.0000 mL | INTRAVENOUS | Status: DC | PRN

## 2016-10-02 MED ORDER — HYDROMORPHONE 1 MG/ML IV SOLN
INTRAVENOUS | Status: DC
  Administered 2016-10-02: 25 mg via INTRAVENOUS
  Administered 2016-10-02: 1 mg via INTRAVENOUS
  Administered 2016-10-03: 1 mL via INTRAVENOUS
  Administered 2016-10-03: 8.86 mg via INTRAVENOUS
  Administered 2016-10-03 (×2): 1 mg via INTRAVENOUS
  Administered 2016-10-03: 6.3 mg via INTRAVENOUS
  Administered 2016-10-03: 25 mg via INTRAVENOUS
  Administered 2016-10-03: 9.8 mg via INTRAVENOUS
  Administered 2016-10-04: 25 mg via INTRAVENOUS
  Administered 2016-10-04: 8.4 mg via INTRAVENOUS
  Administered 2016-10-04 (×2): 1 mg via INTRAVENOUS
  Administered 2016-10-04: 4.7 mg via INTRAVENOUS
  Administered 2016-10-04: 7.7 mg via INTRAVENOUS
  Administered 2016-10-05: 1 mg via INTRAVENOUS
  Administered 2016-10-05: 4.9 mg via INTRAVENOUS
  Filled 2016-10-02 (×6): qty 25

## 2016-10-02 MED ORDER — CEFTRIAXONE SODIUM 250 MG IJ SOLR
250.0000 mg | Freq: Once | INTRAMUSCULAR | Status: AC
  Administered 2016-10-02: 250 mg via INTRAMUSCULAR
  Filled 2016-10-02: qty 250

## 2016-10-02 MED ORDER — LIDOCAINE HCL 1 % IJ SOLN
INTRAMUSCULAR | Status: AC
  Administered 2016-10-02: 1 mL
  Filled 2016-10-02: qty 20

## 2016-10-02 MED ORDER — SODIUM CHLORIDE 0.45 % IV SOLN
INTRAVENOUS | Status: DC
  Administered 2016-10-02: 11:00:00 via INTRAVENOUS
  Administered 2016-10-02: 1 mL via INTRAVENOUS
  Administered 2016-10-03 (×2): via INTRAVENOUS
  Administered 2016-10-04: 1000 mL via INTRAVENOUS
  Administered 2016-10-04: 22:00:00 via INTRAVENOUS
  Administered 2016-10-04: 1000 mL via INTRAVENOUS
  Administered 2016-10-05: 06:00:00 via INTRAVENOUS

## 2016-10-02 MED ORDER — ONDANSETRON HCL 4 MG/2ML IJ SOLN
4.0000 mg | Freq: Four times a day (QID) | INTRAMUSCULAR | Status: DC | PRN
  Administered 2016-10-04 – 2016-10-05 (×2): 4 mg via INTRAVENOUS
  Filled 2016-10-02 (×2): qty 2

## 2016-10-02 MED ORDER — NALOXONE HCL 0.4 MG/ML IJ SOLN: 0.4000 mg | INTRAMUSCULAR | Status: DC | PRN

## 2016-10-02 MED ORDER — SODIUM CHLORIDE 0.9 % IV SOLN
25.0000 mg | INTRAVENOUS | Status: DC | PRN
  Filled 2016-10-02: qty 0.5

## 2016-10-02 MED ORDER — HYDROMORPHONE HCL 1 MG/ML IJ SOLN
2.0000 mg | Freq: Once | INTRAMUSCULAR | Status: AC
  Administered 2016-10-02: 2 mg via INTRAVENOUS
  Filled 2016-10-02: qty 2

## 2016-10-02 MED ORDER — DIPHENHYDRAMINE HCL 25 MG PO CAPS: 25.0000 mg | ORAL_CAPSULE | ORAL | Status: DC | PRN

## 2016-10-02 MED ORDER — ONDANSETRON HCL 4 MG/2ML IJ SOLN
4.0000 mg | Freq: Once | INTRAMUSCULAR | Status: AC
  Administered 2016-10-02: 4 mg via INTRAVENOUS
  Filled 2016-10-02: qty 2

## 2016-10-02 MED ORDER — KETOROLAC TROMETHAMINE 30 MG/ML IJ SOLN
30.0000 mg | Freq: Four times a day (QID) | INTRAMUSCULAR | Status: DC
  Administered 2016-10-02 – 2016-10-05 (×11): 30 mg via INTRAVENOUS
  Filled 2016-10-02 (×11): qty 1

## 2016-10-02 MED ORDER — VALACYCLOVIR HCL 500 MG PO TABS
1000.0000 mg | ORAL_TABLET | Freq: Every day | ORAL | Status: DC
  Administered 2016-10-02 – 2016-10-05 (×4): 1000 mg via ORAL
  Filled 2016-10-02 (×4): qty 2

## 2016-10-02 MED ORDER — MORPHINE SULFATE ER 30 MG PO TBCR
60.0000 mg | EXTENDED_RELEASE_TABLET | Freq: Two times a day (BID) | ORAL | Status: DC
  Administered 2016-10-02 – 2016-10-05 (×7): 60 mg via ORAL
  Filled 2016-10-02 (×7): qty 2

## 2016-10-02 MED ORDER — HYDROXYUREA 500 MG PO CAPS
1000.0000 mg | ORAL_CAPSULE | Freq: Every day | ORAL | Status: DC
  Administered 2016-10-02 – 2016-10-05 (×4): 1000 mg via ORAL
  Filled 2016-10-02 (×4): qty 2

## 2016-10-02 MED ORDER — KETOROLAC TROMETHAMINE 30 MG/ML IJ SOLN
30.0000 mg | INTRAMUSCULAR | Status: AC
  Administered 2016-10-02: 30 mg via INTRAVENOUS
  Filled 2016-10-02: qty 1

## 2016-10-02 MED ORDER — RIVAROXABAN 20 MG PO TABS
20.0000 mg | ORAL_TABLET | Freq: Every day | ORAL | Status: DC
  Administered 2016-10-02 – 2016-10-04 (×3): 20 mg via ORAL
  Filled 2016-10-02 (×3): qty 1

## 2016-10-02 NOTE — ED Notes (Signed)
Patient given sandwich and graham crackers.

## 2016-10-02 NOTE — ED Notes (Signed)
Report given to RN on 3W  

## 2016-10-02 NOTE — ED Provider Notes (Signed)
[facility] DEPT Provider Note   CSN: [id number] Arrival date & time: 10/02/16  6203     History   Chief Complaint Chief Complaint  Patient presents with  . Back Pain  . Jaw Pain    HPI [name] is a 26 y.o. female.  HPI   Patient is a 26 year old female with history of sickle cell anemia who presents to the ED with complaint of sickle cell crisis. Patient reports she began having gradual onset low back pain yesterday which has remained constant and gradually worsened today. Denies any recent fall, trauma or injury. She reports her back pain feels consistent with sickle cell pain she has had in the past. She reports taking a dose of her morphine at home yesterday without relief. Denies taking any medications today. She also reports this morning she began having gradual onset of light pressure over the side of her jaw which she notes is worse when moving her jaw. Denies any recent facial injury. Denies facial swelling, dental pain, trismus, drooling, dysphagia. Pt denies fever, HA, visual changes, dizziness, cough, SOB, CP, abdominal pain, N/V, numbness, tingling, groin anesthesia, loss of bowel or bladder, weakness, IVDU, cancer or recent spinal manipulation.   Patient also notes she was diagnosed last week with gonorrhea but states she has been unable to return back to the clinic to receive her treatment. Patient is requesting treatment for gonorrhea while in the ED today.  Past Medical History:  Diagnosis Date  . Blood transfusion    "lots"  . Blood transfusion without reported diagnosis   . Chronic back pain    "very severe; have knot in my back; from tight muscle; take RX and exercise for it"  . Depression 01/06/2011  . Exertional dyspnea    "sometimes"  . Genital HSV   . GERD (gastroesophageal reflux disease) 02/17/2011  . Migraines 11/08/11   "@ least twice/month"  . 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  . Mood swings ([place]) 11/08/11   "I go back  and forth; real bad"  . Sickle cell anemia ([name])   . Sickle cell anemia with crisis ([place])   . Trichotillomania    h/o    Patient Active Problem List   Diagnosis Date Noted  . 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 Chronic anticoagulation   . Sickle cell anemia ([place]) 05/19/2016  . History of DVT (deep vein thrombosis) 04/17/2016  . Thrombocytosis ([place]) 11/09/2015  . Hyperbilirubinemia 11/09/2015  . Chronic pain 08/04/2015  . Herpes simplex 07/14/2015  . Anemia of chronic disease   . 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 Sickle cell disease ([place]) 01/08/2009  . 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 CHOLECYSTECTOMY  05/2010  . DILATION AND CURETTAGE OF UTERUS  02/20/11   S/P miscarriage  . IR GENERIC HISTORICAL  12/23/2015   IR FLUORO GUIDE CV LINE RIGHT 12/23/2015 Jacqulynn Cadet, MD [facility]  . IR GENERIC HISTORICAL  12/23/2015   IR US GUIDE VASC ACCESS RIGHT 12/23/2015 Jacqulynn Cadet, MD [facility]    OB History    Gravida Para Term Preterm AB Living   5 2 2  0 3 2   SAB TAB Ectopic Multiple Live Births   3 0 0 0 2      Obstetric Comments   Miscarried in October 2012 at about 7 weeks       Home Medications  Prior to Admission medications   Medication Sig Start Date End Date Taking? Authorizing Provider  Biotin 1 MG CAPS Take 2 mg by mouth daily.   Yes [provider]  hydroxyurea (HYDREA) 500 MG capsule Take 2 capsules (1,000 mg total) by mouth daily. May take with food to minimize GI side effects. Patient taking differently: Take 1,000 mg by mouth 2 (two) times daily. May take with food to minimize GI side effects. 04/18/16  Yes Tresa Garter, MD  medroxyPROGESTERone (DEPO-PROVERA) 150 MG/ML injection Inject 1 mL (150 mg total) into the muscle every 3 (three) months. 09/21/16  Yes Shelly Bombard, MD  morphine (MS CONTIN) 60 MG 12 hr tablet  Take 60 mg by mouth every 12 (twelve) hours as needed for pain.    Yes [provider]  Multiple Vitamins-Minerals ([employer] DAILY ENERGY) TABS Take 2 tablets by mouth daily.   Yes [provider]  oxyCODONE (ROXICODONE) 15 MG immediate release tablet Take 15 mg by mouth every 4 (four) hours as needed for pain.    Yes [provider]  rivaroxaban (XARELTO) 20 MG TABS tablet Take 20 mg by mouth daily with supper.    Yes [provider]  valACYclovir (VALTREX) 1000 MG tablet Take 1 tablet (1,000 mg total) by mouth daily. 05/02/16  Yes [name], MD    Family History Family History  Problem Relation Age of Onset  . Sickle cell trait Mother   . Sickle cell trait Father   . Diabetes Maternal Grandmother   . Diabetes Paternal Grandmother   . Hypertension Paternal Grandmother   . Diabetes Maternal Grandfather     Social History Social History  Substance Use Topics  . Smoking status: Former Smoker    Packs/day: 0.25    Years: 1.00    Types: Cigarettes    Quit date: 03/25/2013  . Smokeless tobacco: Never Used  . Alcohol use Yes     Comment: Twice a month     Allergies   Food and Latex   Review of Systems Review of Systems  HENT:       Jaw pain  Musculoskeletal: Positive for back pain.  Neurological: Positive for headaches.  All other systems reviewed and are negative.    Physical Exam Updated Vital Signs BP 119/74   Pulse (!) 112   Temp 98.5 F (36.9 C) (Oral)   Resp 18   Ht 5\' 1"  (1.549 m)   Wt 200 lb (90.7 kg)   LMP 08/29/2016   SpO2 97%   BMI 37.79 kg/m   Physical Exam  Constitutional: She is oriented to person, place, and time. She appears well-developed and well-nourished. No distress.  HENT:  Head: Normocephalic and atraumatic.  Mouth/Throat: Uvula is midline, oropharynx is clear and moist and mucous membranes are normal. No oral lesions. No trismus in the jaw. No dental abscesses or uvula swelling. No  oropharyngeal exudate, posterior oropharyngeal edema, posterior oropharyngeal erythema or tonsillar abscesses. No tonsillar exudate.  Eyes: Conjunctivae and EOM are normal. Pupils are equal, round, and reactive to light. Right eye exhibits no discharge. Left eye exhibits no discharge. No scleral icterus.  Neck: Normal range of motion. Neck supple.  Cardiovascular: Normal rate, regular rhythm, normal heart sounds and intact distal pulses.   Pulmonary/Chest: Effort normal and breath sounds normal. No respiratory distress. She has no wheezes. She has no rales. She exhibits no tenderness.  Abdominal: Soft. Bowel sounds are normal. She exhibits no distension and no mass.  There is no tenderness. There is no rebound and no guarding.  Musculoskeletal: Normal range of motion. She exhibits tenderness. She exhibits no edema or deformity.  No midline C, T, or L tenderness. Mild TTP over lower thoracic and lumbar paraspinal muscles. Full range of motion of neck and back. Full range of motion of bilateral upper and lower extremities, with 5/5 strength. Sensation intact. 2+ radial and PT pulses. Cap refill <2 seconds. Patient able to stand and ambulate without assistance.    Lymphadenopathy:    She has no cervical adenopathy.  Neurological: She is alert and oriented to person, place, and time. She has normal strength. No cranial nerve deficit or sensory deficit. Coordination normal.  Skin: Skin is warm and dry. She is not diaphoretic.  Nursing note and vitals reviewed.    ED Treatments / Results  Labs (all labs ordered are listed, but only abnormal results are displayed) Labs Reviewed  CBC WITH DIFFERENTIAL/PLATELET - Abnormal; Notable for the following:       Result Value   WBC 12.4 (*)    RBC 3.15 (*)    Hemoglobin 9.1 (*)    HCT 26.6 (*)    RDW 18.8 (*)    Platelets 516 (*)    Neutro Abs 9.9 (*)    All other components within normal limits  COMPREHENSIVE METABOLIC PANEL - Abnormal; Notable for the  following:    Glucose, Bld 100 (*)    Calcium 8.6 (*)    Total Bilirubin 2.1 (*)    Anion gap 3 (*)    All other components within normal limits  RETICULOCYTES - Abnormal; Notable for the following:    Retic Ct Pct 12.1 (*)    RBC. 3.15 (*)    Retic Count, Manual 381.2 (*)    All other components within normal limits    EKG  EKG Interpretation None       Radiology No results found.  Procedures Procedures (including critical care time)  Medications Ordered in ED Medications  0.45 % sodium chloride infusion ( Intravenous New Bag/Given 10/02/16 1050)  ketorolac (TORADOL) 30 MG/ML injection 30 mg (30 mg Intravenous Given 10/02/16 1100)  diphenhydrAMINE (BENADRYL) injection 25 mg (25 mg Intravenous Given 10/02/16 1100)  HYDROmorphone (DILAUDID) injection 2 mg (2 mg Intravenous Given 10/02/16 1057)  ondansetron (ZOFRAN) injection 4 mg (4 mg Intravenous Given 10/02/16 1100)  HYDROmorphone (DILAUDID) injection 2 mg (2 mg Intravenous Given 10/02/16 1205)  HYDROmorphone (DILAUDID) injection 2 mg (2 mg Intravenous Given 10/02/16 1315)  diphenhydrAMINE (BENADRYL) injection 25 mg (25 mg Intravenous Given 10/02/16 1315)     Initial Impression / Assessment and Plan / ED Course  I have reviewed the triage vital signs and the nursing notes.  Pertinent labs & imaging results that were available during my care of the patient were reviewed by me and considered in my medical decision making (see chart for details).     Patient presents with low back pain consistent with her typical sickle cell pain. No relief with home pain meds. No back pain red flags. No chest pain or shortness of breath. VSS. Exam revealed tenderness over bilateral lumbar paraspinal muscles. No midline spinal tenderness. No neuro deficits. Remaining exam unremarkable. Patient given IV fluids, pain meds, Toradol, Benadryl and Zofran in the ED. Labs unremarkable. On reevaluation after third dose of 2 mg Dilaudid IV patient reports  her pain has only mildly improved from 10/10 to 8/10 and is requesting admission for pain control. Consulted hospitalist, Dr.  Jegede agrees to admission.  Patient treated with single dose of IM Rocephin for reported gonorrhea while in the ED.  Final Clinical Impressions(s) / ED Diagnoses   Final diagnoses:  Sickle cell crisis [facility])    New Prescriptions New Prescriptions   No medications on file     Nona Dell, Hershal Coria 10/02/16 1406    Carmin Muskrat, MD 10/02/16 1558

## 2016-10-02 NOTE — ED Notes (Signed)
Main lab at bedside 

## 2016-10-02 NOTE — ED Triage Notes (Signed)
Patient c/o lower back pain since yesterday and temporal pain that radiates to jaw that started this morning.

## 2016-10-02 NOTE — ED Notes (Signed)
Valetta Mole PA aware patient requesting medication for nausea

## 2016-10-02 NOTE — ED Notes (Signed)
Main lab phlebotomy called to attempt blood draw.

## 2016-10-02 NOTE — ED Notes (Addendum)
Dr. Jegede at bedside. 

## 2016-10-02 NOTE — ED Notes (Signed)
Bed: KV42 Expected date:  Expected time:  Means of arrival:  Comments: SS

## 2016-10-02 NOTE — H&P (Signed)
H&P   Patient Demographics:    [name], is a 26 y.o. female  MRN: [id number]   DOB - [birth year]  Admit Date - 10/02/2016  Outpatient Primary MD for the patient is Ricke Hey, MD  Chief Complaint  Patient presents with  . Back Pain  . Jaw Pain     HPI:    [name]  is a 26 y.o. 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 Patient reports she began having gradual onset low back pain yesterday which has remained constant and gradually worsened today. Denies any recent fall, trauma or injury. She reports her back pain feels consistent with sickle cell pain she has had in the past. She reports taking a dose of her morphine at home yesterday without relief. Pain intensity is 10/10 and localized to lower back. She is describing pain as constant and throbbing. [name] will be admitted to inpatient services for a higher level of care. Pt denies fever, HA, visual changes, dizziness, cough, SOB, CP, abdominal pain, N/V, numbness, tingling, groin anesthesia, loss of bowel or bladder, weakness, IVDU, cancer or recent spinal manipulation.  ED Course: Patient was seen to be afebrile, pulse from 91-112, and other vitals within normal limits. Lab work revealed WBC 12.4, hemoglobin 9.1, platelets 516, and all other labs relatively within normal limits. Patient was given multiple doses of 2 mg of Dilaudid while in the ED with no significant pain relief. Patient is being admitted for further evaluation and pain control.   Review of systems:    In addition to the HPI above, patient reports No Fever-chills No Headache, No changes with Vision or hearing No problems swallowing food or Liquids No Chest pain, Cough or Shortness of Breath No Abdominal pain, No Nausea or Vomiting, Bowel movements are regular No Blood in stool or Urine No dysuria No new skin rashes or  bruises No new joints pains-aches No new weakness, tingling, numbness in any extremity No recent weight gain or loss No polyuria, polydypsia or polyphagia No significant Mental Stressors  A full 10 point Review of Systems was done, except as stated above, all other Review of Systems were negative.  With Past History of the following :   Past Medical History:  Diagnosis Date  . Blood transfusion    "lots"  . Blood transfusion without reported diagnosis   . Chronic back pain    "very severe; have knot in my back; from tight muscle; take RX and exercise for it"  . Depression 01/06/2011  . Exertional dyspnea    "sometimes"  . Genital HSV   . GERD (gastroesophageal reflux disease) 02/17/2011  . Migraines 11/08/11   "@ least twice/month"  . 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  . Mood swings ([place]) 11/08/11   "I go back and forth; real bad"  . Sickle cell anemia ([name])   . Sickle cell anemia with crisis ([place])   . Trichotillomania    h/o     Past Surgical History:  Procedure Laterality Date  . CHOLECYSTECTOMY  05/2010  . DILATION AND CURETTAGE OF UTERUS  02/20/11   S/P miscarriage  . IR GENERIC HISTORICAL  12/23/2015   IR FLUORO GUIDE CV LINE RIGHT 12/23/2015 Jacqulynn Cadet, MD [facility]  . IR GENERIC HISTORICAL  12/23/2015   IR US GUIDE VASC ACCESS RIGHT 12/23/2015 Jacqulynn Cadet, MD [facility]  Social History:   Social History  Substance Use Topics  . Smoking status: Former Smoker    Packs/day: 0.25    Years: 1.00    Types: Cigarettes    Quit date: 03/25/2013  . Smokeless tobacco: Never Used  . Alcohol use Yes     Comment: Twice a month    Lives - At home   Family History :   Family History  Problem Relation Age of Onset  . Sickle cell trait Mother   . Sickle cell trait Father   . Diabetes Maternal Grandmother   . Diabetes Paternal Grandmother   . Hypertension Paternal Grandmother   . Diabetes Maternal Grandfather     Home Medications:    Prior to Admission medications   Medication Sig Start Date End Date Taking? Authorizing Provider  Biotin 1 MG CAPS Take 2 mg by mouth daily.   Yes [provider]  hydroxyurea (HYDREA) 500 MG capsule Take 2 capsules (1,000 mg total) by mouth daily. May take with food to minimize GI side effects. Patient taking differently: Take 1,000 mg by mouth 2 (two) times daily. May take with food to minimize GI side effects. 04/18/16  Yes Tresa Garter, MD  medroxyPROGESTERone (DEPO-PROVERA) 150 MG/ML injection Inject 1 mL (150 mg total) into the muscle every 3 (three) months. 09/21/16  Yes Shelly Bombard, MD  morphine (MS CONTIN) 60 MG 12 hr tablet Take 60 mg by mouth every 12 (twelve) hours as needed for pain.    Yes [provider]  Multiple Vitamins-Minerals ([employer] DAILY ENERGY) TABS Take 2 tablets by mouth daily.   Yes [provider]  oxyCODONE (ROXICODONE) 15 MG immediate release tablet Take 15 mg by mouth every 4 (four) hours as needed for pain.    Yes [provider]  rivaroxaban (XARELTO) 20 MG TABS tablet Take 20 mg by mouth daily with supper.    Yes [provider]  valACYclovir (VALTREX) 1000 MG tablet Take 1 tablet (1,000 mg total) by mouth daily. 05/02/16  Yes [name], MD    Allergies:   Allergies  Allergen Reactions  . Food Hives and Other (See Comments)    Pt is allergic to carrots.    . Latex Rash    Physical Exam:  Vitals:  Vitals:   10/02/16 1330 10/02/16 1400  BP: 119/74 (!) 99/52  Pulse: (!) 112 96  Resp: 18 (!) 26  Temp:     Physical Exam: Constitutional: Patient appears well-developed and well-nourished. Not in obvious distress, obese HENT: Normocephalic, atraumatic, External right and left ear normal. Oropharynx is clear and moist.  Eyes: Conjunctivae and EOM are normal. PERRLA, no scleral icterus. Neck: Normal ROM. Neck supple. No JVD. No tracheal deviation. No thyromegaly. CVS: RRR, S1/S2 +, no  murmurs, no gallops, no carotid bruit.  Pulmonary: Effort and breath sounds normal, no stridor, rhonchi, wheezes, rales.  Abdominal: Soft. BS +, no distension, tenderness, rebound or guarding.  Musculoskeletal: Normal range of motion. No edema and no tenderness.  Lymphadenopathy: No lymphadenopathy noted, cervical, inguinal or axillary Neuro: Alert. Normal reflexes, muscle tone coordination. No cranial nerve deficit. Skin: Skin is warm and dry. No rash noted. Not diaphoretic. No erythema. No pallor. Psychiatric: Normal mood and affect. Behavior, judgment, thought content normal.   Data Review:    CBC  Recent Labs Lab 10/02/16 1011  WBC 12.4*  HGB 9.1*  HCT 26.6*  PLT 516*  MCV 84.4  MCH 28.9  MCHC 34.2  RDW  18.8*  LYMPHSABS 1.3  MONOABS 1.0  EOSABS 0.2  BASOSABS 0.0  ---------------------------------------------------------------------------------------------------------------- Chemistries   Recent Labs Lab 10/02/16 1011  NA 136  K 3.8  CL 108  CO2 25  GLUCOSE 100*  BUN 7  CREATININE 0.44  CALCIUM 8.6*  AST 17  ALT 15  ALKPHOS 62  BILITOT 2.1*   ------------------------------------------------------------------------------------------------------------------ estimated creatinine clearance is 111.3 mL/min (by C-G formula based on SCr of 0.44 mg/dL). ------------------------------------------------------------------------------------------------------------------ No results for input(s): TSH, T4TOTAL, T3FREE, THYROIDAB in the last 72 hours.  Invalid input(s): FREET3  Coagulation profile No results for input(s): INR, PROTIME in the last 168 hours. ------------------------------------------------------------------------------------------------------------------- No results for input(s): DDIMER in the last 72 hours. -------------------------------------------------------------------------------------------------------------------  Cardiac Enzymes No results for  input(s): CKMB, TROPONINI, MYOGLOBIN in the last 168 hours.  Invalid input(s): CK ------------------------------------------------------------------------------------------------------------------ No results found for: BNP --------------------------------------------------------------------------------------------------------------- Urinalysis    Component Value Date/Time   COLORURINE YELLOW 09/16/2016 0300   APPEARANCEUR CLEAR 09/16/2016 0300   APPEARANCEUR Clear 05/06/2012 1546   LABSPEC 1.009 09/16/2016 0300   LABSPEC 1.011 05/06/2012 1546   PHURINE 5.0 09/16/2016 0300   GLUCOSEU NEGATIVE 09/16/2016 0300   GLUCOSEU Negative 05/06/2012 1546   HGBUR NEGATIVE 09/16/2016 0300   HGBUR negative 01/08/2009 1329   BILIRUBINUR NEGATIVE 09/16/2016 0300   BILIRUBINUR Negative 05/06/2012 1546   KETONESUR NEGATIVE 09/16/2016 0300   PROTEINUR NEGATIVE 09/16/2016 0300   UROBILINOGEN 2.0 (H) 10/26/2015 0817   NITRITE NEGATIVE 09/16/2016 0300   LEUKOCYTESUR NEGATIVE 09/16/2016 0300   LEUKOCYTESUR Negative 05/06/2012 1546   ----------------------------------------------------------------------------------------------------------------   Imaging Results:    Assessment & Plan:    Active Problems:   Sickle cell anemia with crisis ([place])  1. Hb SS with crisis:  Admit to inpatient, start IVF .45% saline, start IV Dilaudid PCA, IV Toradol, restart home MS Contin and other adjunct therapies. Continue Hydroxyurea and Endari 2. Anemia of Chronic Disease:Hb at baseline, continue to monitor 3. Leukocytosis: No evidence of infection. Likely related to sickle cell crisis. 4. Chronic Anticoagulation:Continue Xarelto  DVT Prophylaxis: Xarelto  AM Labs Ordered, also please review Full Orders  Family Communication: Admission, patient's condition and plan of care including tests being ordered have been discussed with the patient who indicate understanding and agree with the plan and Code  Status.  Code Status: Full Code  Consults called: None    Admission status: Inpatient    Time spent in minutes : 50 minutes  Donal Lynam MD, MHA, CPE, FACP 10/02/2016 at 2:18 PM

## 2016-10-03 DIAGNOSIS — G8929 Other chronic pain: Secondary | ICD-10-CM | POA: Diagnosis not present

## 2016-10-03 DIAGNOSIS — M545 Low back pain: Secondary | ICD-10-CM | POA: Diagnosis not present

## 2016-10-03 DIAGNOSIS — A6000 Herpesviral infection of urogenital system, unspecified: Secondary | ICD-10-CM | POA: Diagnosis not present

## 2016-10-03 DIAGNOSIS — D638 Anemia in other chronic diseases classified elsewhere: Secondary | ICD-10-CM | POA: Diagnosis not present

## 2016-10-03 DIAGNOSIS — D5700 Hb-SS disease with crisis, unspecified: Secondary | ICD-10-CM | POA: Diagnosis not present

## 2016-10-03 DIAGNOSIS — D72829 Elevated white blood cell count, unspecified: Secondary | ICD-10-CM | POA: Diagnosis not present

## 2016-10-03 MED ORDER — OXYCODONE HCL 5 MG PO TABS
15.0000 mg | ORAL_TABLET | ORAL | Status: DC | PRN
  Administered 2016-10-03 – 2016-10-04 (×2): 15 mg via ORAL
  Filled 2016-10-03 (×2): qty 3

## 2016-10-03 NOTE — Progress Notes (Signed)
Patient ID: [name], female   DOB: 1991-03-06, 26 y.o.   MRN: [id number] Subjective:  Opiate tolerant patient admitted for sickle cell pain crisis. She continues to have pain 7/10, slightly better than last night but patient demanding for IV dilaudid push. She is currently on Dilaudid via PCA and home MS Contin as well as IV Toradol. She denies any fever, denies chest pain or SOB. Ambulating well. Gonorrhea +ve, treated yesterday.  Objective:  Vital signs in last 24 hours:  Vitals:   10/03/16 0204 10/03/16 0236 10/03/16 0632 10/03/16 0800  BP:  116/89 119/85   Pulse:  (!) 55 (!) 102   Resp: 16 16 16 14   Temp:  98.5 F (36.9 C) 98.4 F (36.9 C)   TempSrc:  Oral Oral   SpO2: 97% 96% 93% 96%  Weight:   92.5 kg (203 lb 14.8 oz)   Height:        Intake/Output from previous day:   Intake/Output Summary (Last 24 hours) at 10/03/16 0959 Last data filed at 10/03/16 0100  Gross per 24 hour  Intake          2595.84 ml  Output                4 ml  Net          2591.84 ml    Physical Exam: General: Alert, awake, oriented x3, in no acute distress. Obese HEENT: [place]/AT PEERL, EOMI Neck: Trachea midline,  no masses, no thyromegal,y no JVD, no carotid bruit OROPHARYNX:  Moist, No exudate/ erythema/lesions.  Heart: Regular rate and rhythm, without murmurs, rubs, gallops, PMI non-displaced, no heaves or thrills on palpation.  Lungs: Clear to auscultation, no wheezing or rhonchi noted. No increased vocal fremitus resonant to percussion  Abdomen: Soft, nontender, nondistended, positive bowel sounds, no masses no hepatosplenomegaly noted..  Neuro: No focal neurological deficits noted cranial nerves II through XII grossly intact. DTRs 2+ bilaterally upper and lower extremities. Strength 5 out of 5 in bilateral upper and lower extremities. Musculoskeletal: No warm swelling or erythema around joints, no spinal tenderness noted. Psychiatric: Patient alert and oriented x3, good insight and  cognition, good recent to remote recall. Lymph node survey: No cervical axillary or inguinal lymphadenopathy noted.  Lab Results:  Basic Metabolic Panel:    Component Value Date/Time   NA 136 10/02/2016 1011   NA 139 05/06/2012 1645   K 3.8 10/02/2016 1011   K 3.9 05/06/2012 1645   CL 108 10/02/2016 1011   CL 110 (H) 05/06/2012 1645   CO2 25 10/02/2016 1011   CO2 22 05/06/2012 1645   BUN 7 10/02/2016 1011   BUN 9 05/06/2012 1645   CREATININE 0.44 10/02/2016 1011   CREATININE 0.43 (L) 07/06/2015 1356   GLUCOSE 100 (H) 10/02/2016 1011   GLUCOSE 84 05/06/2012 1645   CALCIUM 8.6 (L) 10/02/2016 1011   CALCIUM 8.3 (L) 05/06/2012 1645   CBC:    Component Value Date/Time   WBC 12.4 (H) 10/02/2016 1011   HGB 9.1 (L) 10/02/2016 1011   HGB 8.7 (L) 05/11/2012 0721   HCT 26.6 (L) 10/02/2016 1011   HCT 26.9 (L) 05/11/2012 0721   PLT 516 (H) 10/02/2016 1011   PLT 497 (H) 05/11/2012 0721   MCV 84.4 10/02/2016 1011   MCV 94 05/11/2012 0721   NEUTROABS 9.9 (H) 10/02/2016 1011   NEUTROABS 10.9 (H) 05/10/2012 0552   LYMPHSABS 1.3 10/02/2016 1011   LYMPHSABS 3.5 05/10/2012 0552   MONOABS  1.0 10/02/2016 1011   MONOABS 1.7 (H) 05/10/2012 0552   EOSABS 0.2 10/02/2016 1011   EOSABS 0.8 (H) 05/10/2012 0552   BASOSABS 0.0 10/02/2016 1011   BASOSABS 0.1 05/10/2012 0552    No results found for this or any previous visit (from the past 240 hour(s)).  Studies/Results: No results found.  Medications: Scheduled Meds: . HYDROmorphone   Intravenous Q4H  . hydroxyurea  1,000 mg Oral Daily  . ketorolac  30 mg Intravenous Q6H  . morphine  60 mg Oral Q12H  . rivaroxaban  20 mg Oral Q supper  . valACYclovir  1,000 mg Oral Daily   Continuous Infusions: . sodium chloride 125 mL/hr at 10/03/16 0855  . diphenhydrAMINE (BENADRYL) IVPB(SICKLE CELL ONLY)     PRN Meds:.diphenhydrAMINE **OR** diphenhydrAMINE (BENADRYL) IVPB(SICKLE CELL ONLY), naloxone **AND** sodium chloride flush, ondansetron  (ZOFRAN) IV, oxyCODONE  Consultants:  None  Procedures:  None  Antibiotics:  None  Assessment/Plan: Active Problems:   Sickle cell anemia with crisis ([place])  1. Hb SS with crisis:  Continue IVF .45% saline, continue IV Dilaudid PCA, IV Toradol, home MS Contin, restart home oxycodone and continue and other adjunct therapies. Continue Hydroxyurea and Endari 2. Anemia of Chronic Disease:Hb at baseline, continue to monitor 3. Leukocytosis: No evidence of infection. Likely related to sickle cell crisis. 4. Chronic Anticoagulation:Continue Xarelto  Code Status: Full Code Family Communication: N/A Disposition Plan: Not yet ready for discharge  [name]  If 7PM-7AM, please contact night-coverage.  10/03/2016, 9:59 AM  LOS: 1 day

## 2016-10-04 DIAGNOSIS — D5700 Hb-SS disease with crisis, unspecified: Secondary | ICD-10-CM | POA: Diagnosis not present

## 2016-10-04 DIAGNOSIS — D638 Anemia in other chronic diseases classified elsewhere: Secondary | ICD-10-CM | POA: Diagnosis not present

## 2016-10-04 DIAGNOSIS — A6000 Herpesviral infection of urogenital system, unspecified: Secondary | ICD-10-CM | POA: Diagnosis not present

## 2016-10-04 DIAGNOSIS — D72829 Elevated white blood cell count, unspecified: Secondary | ICD-10-CM | POA: Diagnosis not present

## 2016-10-04 DIAGNOSIS — M545 Low back pain: Secondary | ICD-10-CM | POA: Diagnosis not present

## 2016-10-04 DIAGNOSIS — G8929 Other chronic pain: Secondary | ICD-10-CM | POA: Diagnosis not present

## 2016-10-04 LAB — CBC WITH DIFFERENTIAL/PLATELET
BASOS ABS: 0.1 10*3/uL (ref 0.0–0.1)
BASOS PCT: 0 %
EOS PCT: 6 %
Eosinophils Absolute: 0.9 10*3/uL — ABNORMAL HIGH (ref 0.0–0.7)
HEMATOCRIT: 19.4 % — AB (ref 36.0–46.0)
Hemoglobin: 6.7 g/dL — CL (ref 12.0–15.0)
Lymphocytes Relative: 16 %
Lymphs Abs: 2.7 10*3/uL (ref 0.7–4.0)
MCH: 29.4 pg (ref 26.0–34.0)
MCHC: 34.5 g/dL (ref 30.0–36.0)
MCV: 85.1 fL (ref 78.0–100.0)
MONO ABS: 1.6 10*3/uL — AB (ref 0.1–1.0)
MONOS PCT: 10 %
NEUTROS ABS: 11.2 10*3/uL — AB (ref 1.7–7.7)
Neutrophils Relative %: 68 %
PLATELETS: 352 10*3/uL (ref 150–400)
RBC: 2.28 MIL/uL — ABNORMAL LOW (ref 3.87–5.11)
RDW: 19.9 % — AB (ref 11.5–15.5)
WBC: 16.4 10*3/uL — ABNORMAL HIGH (ref 4.0–10.5)

## 2016-10-04 NOTE — Progress Notes (Signed)
Notified Dr. Jonelle Sidle on the result of hemoglobin level of 6.7. No new order.

## 2016-10-04 NOTE — Progress Notes (Signed)
Lab called stating patient hgb level this morning is 6.7. On call notified. Waiting for f/u orders. Handoff was giving to 0700 RN.

## 2016-10-04 NOTE — Progress Notes (Signed)
Patient ID: [name], female   DOB: [birth year], 26 y.o.   MRN: [id number] Subjective: Patient doing much better today. Her pain is down to 5 out of 10. She is on Dilaudid PCA and has use 80 mg with 35 demise and 35 deliveries. She denied any nausea vomiting or diarrhea. She has walked to the bathroom today   Objective:  Vital signs in last 24 hours:  Vitals:   10/04/16 1200 10/04/16 1435 10/04/16 1500 10/04/16 1552  BP:   117/64   Pulse:   90   Resp: 15 13 14 14   Temp:   98.7 F (37.1 C)   TempSrc:   Oral   SpO2: 96% 96% 95% 96%  Weight:      Height:        Intake/Output from previous day:   Intake/Output Summary (Last 24 hours) at 10/04/16 1754 Last data filed at 10/04/16 1413  Gross per 24 hour  Intake          4687.85 ml  Output                0 ml  Net          4687.85 ml    Physical Exam: General: Alert, awake, oriented x3, in no acute distress. Obese HEENT: /AT PEERL, EOMI Neck: Trachea midline,  no masses, no thyromegal,y no JVD, no carotid bruit OROPHARYNX:  Moist, No exudate/ erythema/lesions.  Heart: Regular rate and rhythm, without murmurs, rubs, gallops, PMI non-displaced, no heaves or thrills on palpation.  Lungs: Clear to auscultation, no wheezing or rhonchi noted. No increased vocal fremitus resonant to percussion  Abdomen: Soft, nontender, nondistended, positive bowel sounds, no masses no hepatosplenomegaly noted..  Neuro: No focal neurological deficits noted cranial nerves II through XII grossly intact. DTRs 2+ bilaterally upper and lower extremities. Strength 5 out of 5 in bilateral upper and lower extremities. Musculoskeletal: No warm swelling or erythema around joints, no spinal tenderness noted. Psychiatric: Patient alert and oriented x3, good insight and cognition, good recent to remote recall. Lymph node survey: No cervical axillary or inguinal lymphadenopathy noted.  Lab Results:  Basic Metabolic Panel:    Component Value Date/Time   NA 136 10/02/2016 1011   NA 139 05/06/2012 1645   K 3.8 10/02/2016 1011   K 3.9 05/06/2012 1645   CL 108 10/02/2016 1011   CL 110 (H) 05/06/2012 1645   CO2 25 10/02/2016 1011   CO2 22 05/06/2012 1645   BUN 7 10/02/2016 1011   BUN 9 05/06/2012 1645   CREATININE 0.44 10/02/2016 1011   CREATININE 0.43 (L) 07/06/2015 1356   GLUCOSE 100 (H) 10/02/2016 1011   GLUCOSE 84 05/06/2012 1645   CALCIUM 8.6 (L) 10/02/2016 1011   CALCIUM 8.3 (L) 05/06/2012 1645   CBC:    Component Value Date/Time   WBC 16.4 (H) 10/04/2016 0600   HGB 6.7 (LL) 10/04/2016 0600   HGB 8.7 (L) 05/11/2012 0721   HCT 19.4 (L) 10/04/2016 0600   HCT 26.9 (L) 05/11/2012 0721   PLT 352 10/04/2016 0600   PLT 497 (H) 05/11/2012 0721   MCV 85.1 10/04/2016 0600   MCV 94 05/11/2012 0721   NEUTROABS 11.2 (H) 10/04/2016 0600   NEUTROABS 10.9 (H) 05/10/2012 0552   LYMPHSABS 2.7 10/04/2016 0600   LYMPHSABS 3.5 05/10/2012 0552   MONOABS 1.6 (H) 10/04/2016 0600   MONOABS 1.7 (H) 05/10/2012 0552   EOSABS 0.9 (H) 10/04/2016 0600   EOSABS 0.8 (H) 05/10/2012 7867  BASOSABS 0.1 10/04/2016 0600   BASOSABS 0.1 05/10/2012 0552    No results found for this or any previous visit (from the past 240 hour(s)).  Studies/Results: No results found.  Medications: Scheduled Meds: . HYDROmorphone   Intravenous Q4H  . hydroxyurea  1,000 mg Oral Daily  . ketorolac  30 mg Intravenous Q6H  . morphine  60 mg Oral Q12H  . rivaroxaban  20 mg Oral Q supper  . valACYclovir  1,000 mg Oral Daily   Continuous Infusions: . sodium chloride 1,000 mL (10/04/16 1439)  . diphenhydrAMINE (BENADRYL) IVPB(SICKLE CELL ONLY)     PRN Meds:.diphenhydrAMINE **OR** diphenhydrAMINE (BENADRYL) IVPB(SICKLE CELL ONLY), naloxone **AND** sodium chloride flush, ondansetron (ZOFRAN) IV, oxyCODONE  Consultants:  None  Procedures:  None  Antibiotics:  None  Assessment/Plan: Active Problems:   Sickle cell anemia with crisis ([place])  1. Hb SS with  crisis:  Patient will be maintained on current regimen. She'll be mobilized. She will also be on her oral medications and titrate of the PCA. If she remains stable she'll be discharged tomorrow 2. Anemia of Chronic Disease:Hb at baseline, continue to monitor 3. Leukocytosis: Due to vaso-occlusive crisis. It is stable now 4. Chronic Anticoagulation:No active bleeding. Continue Xarelto  Code Status: Full Code Family Communication: N/A Disposition Plan: Not yet ready for discharge  Zaidyn Claire,LAWAL  If 7PM-7AM, please contact night-coverage.  10/04/2016, 5:54 PM  LOS: 2 days

## 2016-10-05 ENCOUNTER — Encounter (HOSPITAL_COMMUNITY): Payer: Self-pay | Admitting: *Deleted

## 2016-10-05 ENCOUNTER — Ambulatory Visit: Payer: Self-pay

## 2016-10-05 ENCOUNTER — Emergency Department (HOSPITAL_COMMUNITY)
Admission: EM | Admit: 2016-10-05 | Discharge: 2016-10-05 | Disposition: A | Discharge status: Home / Self Care | Payer: Medicare Other | Source: Home / Self Care | Attending: Emergency Medicine | Admitting: Emergency Medicine

## 2016-10-05 DIAGNOSIS — Z86718 Personal history of other venous thrombosis and embolism: Secondary | ICD-10-CM | POA: Diagnosis not present

## 2016-10-05 DIAGNOSIS — Z91040 Latex allergy status: Secondary | ICD-10-CM

## 2016-10-05 DIAGNOSIS — M545 Low back pain: Secondary | ICD-10-CM | POA: Diagnosis not present

## 2016-10-05 DIAGNOSIS — M25511 Pain in right shoulder: Secondary | ICD-10-CM | POA: Diagnosis not present

## 2016-10-05 DIAGNOSIS — D638 Anemia in other chronic diseases classified elsewhere: Secondary | ICD-10-CM | POA: Diagnosis present

## 2016-10-05 DIAGNOSIS — Z9119 Patient's noncompliance with other medical treatment and regimen: Secondary | ICD-10-CM | POA: Diagnosis not present

## 2016-10-05 DIAGNOSIS — Z91018 Allergy to other foods: Secondary | ICD-10-CM | POA: Diagnosis not present

## 2016-10-05 DIAGNOSIS — Z79899 Other long term (current) drug therapy: Secondary | ICD-10-CM

## 2016-10-05 DIAGNOSIS — Z87891 Personal history of nicotine dependence: Secondary | ICD-10-CM

## 2016-10-05 DIAGNOSIS — M549 Dorsalgia, unspecified: Secondary | ICD-10-CM | POA: Diagnosis not present

## 2016-10-05 DIAGNOSIS — D5700 Hb-SS disease with crisis, unspecified: Secondary | ICD-10-CM

## 2016-10-05 DIAGNOSIS — Z7901 Long term (current) use of anticoagulants: Secondary | ICD-10-CM

## 2016-10-05 DIAGNOSIS — G894 Chronic pain syndrome: Secondary | ICD-10-CM | POA: Diagnosis present

## 2016-10-05 DIAGNOSIS — M879 Osteonecrosis, unspecified: Secondary | ICD-10-CM | POA: Diagnosis not present

## 2016-10-05 DIAGNOSIS — G8929 Other chronic pain: Secondary | ICD-10-CM | POA: Diagnosis not present

## 2016-10-05 DIAGNOSIS — D72829 Elevated white blood cell count, unspecified: Secondary | ICD-10-CM | POA: Diagnosis present

## 2016-10-05 DIAGNOSIS — K219 Gastro-esophageal reflux disease without esophagitis: Secondary | ICD-10-CM | POA: Diagnosis not present

## 2016-10-05 DIAGNOSIS — A6000 Herpesviral infection of urogenital system, unspecified: Secondary | ICD-10-CM | POA: Diagnosis not present

## 2016-10-05 LAB — CBC WITH DIFFERENTIAL/PLATELET
BASOS ABS: 0.2 10*3/uL — AB (ref 0.0–0.1)
BASOS PCT: 1 %
EOS ABS: 0.9 10*3/uL — AB (ref 0.0–0.7)
Eosinophils Relative: 6 %
HCT: 19 % — ABNORMAL LOW (ref 36.0–46.0)
HEMOGLOBIN: 6.5 g/dL — AB (ref 12.0–15.0)
Lymphocytes Relative: 17 %
Lymphs Abs: 2.6 10*3/uL (ref 0.7–4.0)
MCH: 29.1 pg (ref 26.0–34.0)
MCHC: 34.2 g/dL (ref 30.0–36.0)
MCV: 85.2 fL (ref 78.0–100.0)
Monocytes Absolute: 1.5 10*3/uL — ABNORMAL HIGH (ref 0.1–1.0)
Monocytes Relative: 10 %
Neutro Abs: 9.9 10*3/uL — ABNORMAL HIGH (ref 1.7–7.7)
Neutrophils Relative %: 66 %
Platelets: 501 10*3/uL — ABNORMAL HIGH (ref 150–400)
RBC: 2.23 MIL/uL — ABNORMAL LOW (ref 3.87–5.11)
RDW: 21.1 % — ABNORMAL HIGH (ref 11.5–15.5)
WBC: 15.1 10*3/uL — ABNORMAL HIGH (ref 4.0–10.5)

## 2016-10-05 MED ORDER — HEPARIN SOD (PORK) LOCK FLUSH 100 UNIT/ML IV SOLN
500.0000 [IU] | INTRAVENOUS | Status: AC | PRN
  Administered 2016-10-05: 500 [IU]
  Filled 2016-10-05: qty 5

## 2016-10-05 NOTE — ED Notes (Signed)
PT stating we do not care that she doesn't feel good, pt stated she was not leaving and used  vulgare language.

## 2016-10-05 NOTE — Discharge Instructions (Signed)
If you are having ongoing symptoms of pain crisis, please go to the sickle cell clinic for ongoing pain management. If they feel it is necessary, they can admit you for more pain control.

## 2016-10-05 NOTE — ED Triage Notes (Signed)
Due to pt displaying rude, unacceptable conduct toward Megan,EMT, security is assisting with discharge out of the department.

## 2016-10-05 NOTE — ED Triage Notes (Signed)
Patient is alert and oriented x4.  She is being seen after being DC from inpatient care at [facility] earlier today.  Patient states that she was instructed to come to the ED by her sickle cell advocate due to her pain not being controlled before she was DC.  Patient has fallen asleep during her triage and can not obtain any more information from her.

## 2016-10-05 NOTE — Progress Notes (Signed)
Pt states "she is too sick to go home." Discharge already complete, port deaccessed and pca discontinued. Dr. Jonelle Sidle paged. Per MD, not medically necessary for pt to stay in hospital. Relayed message to pt.

## 2016-10-05 NOTE — Progress Notes (Signed)
1mL's PCA hydromorphone wasted with Rollene Fare, Therapist, sports.

## 2016-10-05 NOTE — ED Triage Notes (Addendum)
Pt not happy regarding discharge, requesting staff to take her to [facility]. Pt is aware ED staff can not transport to [facility]. Pt verbally stating she is going to pass out if she has to walk. WC provided to lobby. Tiffany at the [facility] aware of pts plan.

## 2016-10-05 NOTE — Discharge Summary (Signed)
Physician Discharge Summary  Patient ID: [name] MRN: [id number] DOB/AGE: [birth year] 26 y.o.  Admit date: 10/02/2016 Discharge date: 10/05/2016  Admission Diagnoses:  Discharge Diagnoses:  Active Problems:   Sickle cell anemia with crisis [facility])   Discharged Condition: good  Hospital Course: Patient admitted with sickle cell painful crisis. She was also positive for gonorrhea. She has recurrent sickle cell crisis with admissions. She was treated with IV Dilaudid PCA with Toradol. Patient responded to treatment. She was transitioned to oral medications. She had AVN of her shoulders which also flared all. At the time of discharge patient was back to baseline. She was functional and able to use her arms without problem. She will follow up with her PCP as indicated.  Consults: None  Significant Diagnostic Studies: labs: Serial CBCs and CMP as well as check. Patient's lab work was within normal limits  Treatments: IV hydration and analgesia: acetaminophen and Dilaudid  Discharge Exam: Blood pressure (!) 127/91, pulse 89, temperature 98.9 F (37.2 C), temperature source Oral, resp. rate 15, height 5\' 1"  (1.549 m), weight 94.8 kg (208 lb 15.9 oz), last menstrual period 08/29/2016, SpO2 97 %, not currently breastfeeding. General appearance: alert, cooperative, appears stated age and no distress Head: Normocephalic, without obvious abnormality, atraumatic Back: symmetric, no curvature. ROM normal. No CVA tenderness. Resp: clear to auscultation bilaterally Cardio: regular rate and rhythm, S1, S2 normal, no murmur, click, rub or gallop GI: soft, non-tender; bowel sounds normal; no masses,  no organomegaly Extremities: extremities normal, atraumatic, no cyanosis or edema Pulses: 2+ and symmetric Skin: Skin color, texture, turgor normal. No rashes or lesions Neurologic: Grossly normal  Disposition: 01-Home or Self Care  Discharge Instructions    Diet - low sodium heart healthy     Complete by:  As directed    Increase activity slowly    Complete by:  As directed      Allergies as of 10/05/2016      Reactions   Food Hives, Other (See Comments)   Pt is allergic to carrots.     Latex Rash      Medication List    TAKE these medications   Biotin 1 MG Caps Take 2 mg by mouth daily.   [employer] DAILY ENERGY Tabs Take 2 tablets by mouth daily.   hydroxyurea 500 MG capsule Commonly known as:  HYDREA Take 2 capsules (1,000 mg total) by mouth daily. May take with food to minimize GI side effects. What changed:  when to take this  additional instructions   medroxyPROGESTERone 150 MG/ML injection Commonly known as:  DEPO-PROVERA Inject 1 mL (150 mg total) into the muscle every 3 (three) months.   morphine 60 MG 12 hr tablet Commonly known as:  MS CONTIN Take 60 mg by mouth every 12 (twelve) hours as needed for pain.   oxyCODONE 15 MG immediate release tablet Commonly known as:  ROXICODONE Take 15 mg by mouth every 4 (four) hours as needed for pain.   rivaroxaban 20 MG Tabs tablet Commonly known as:  XARELTO Take 20 mg by mouth daily with supper.   valACYclovir 1000 MG tablet Commonly known as:  VALTREX Take 1 tablet (1,000 mg total) by mouth daily.        SignedBarbette Merino 10/05/2016, 7:27 AM   Time spent 33 minutes

## 2016-10-05 NOTE — ED Provider Notes (Signed)
[facility] DEPT Provider Note   CSN: [id number] Arrival date & time: 10/05/16  1042     History   Chief Complaint Chief Complaint  Patient presents with  . Sickle Cell Pain Crisis    HPI [name] is a 26 y.o. female.  HPI  26 year old female who presents with sickle cell pain crisis. History of HB SS, DVT on Xarelto. The patient was just discharged from the hospital this morning at 7:30 AM due to an inpatient stay for pain management for sickle cell pain crisis.  Patient states that she is still in significant amount of pain in her right arm and right leg. Consistent with prior history of sickle cell pain crises. She was instructed to come to the ED by her sickle cell advocate to her pain not being well controlled. Pain severe, worse with movement and ambulation. Did not take medications since she left hospital this morning. In triage, she was noted to be fast asleep and states that her ride could not pick her up.  Past Medical History:  Diagnosis Date  . Blood transfusion    "lots"  . Blood transfusion without reported diagnosis   . Chronic back pain    "very severe; have knot in my back; from tight muscle; take RX and exercise for it"  . Depression 01/06/2011  . Exertional dyspnea    "sometimes"  . Genital HSV   . GERD (gastroesophageal reflux disease) 02/17/2011  . Migraines 11/08/11   "@ least twice/month"  . 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  . Mood swings ([place]) 11/08/11   "I go back and forth; real bad"  . Sickle cell anemia ([name])   . Sickle cell anemia with crisis ([place])   . Trichotillomania    h/o    Patient Active Problem List   Diagnosis Date Noted  . Sickle cell anemia with crisis ([place]) 10/02/2016  . Hb-SS disease with crisis ([place]) 08/20/2016  . Chronic anticoagulation   . Sickle cell anemia ([place]) 05/19/2016  . History of DVT (deep vein thrombosis) 04/17/2016  . Thrombocytosis ([place]) 11/09/2015  . Hyperbilirubinemia 11/09/2015    . Chronic pain 08/04/2015  . Herpes simplex 07/14/2015  . Anemia of chronic disease   . Sickle cell crisis ([place]) 07/31/2013  . Hb-SS disease without crisis ([place]) 02/26/2013  . 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 Sickle cell disease ([place]) 01/08/2009  . 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 CHOLECYSTECTOMY  05/2010  . DILATION AND CURETTAGE OF UTERUS  02/20/11   S/P miscarriage  . IR GENERIC HISTORICAL  12/23/2015   IR FLUORO GUIDE CV LINE RIGHT 12/23/2015 Jacqulynn Cadet, MD [facility]-INTERV RAD  . IR GENERIC HISTORICAL  12/23/2015   IR US GUIDE VASC ACCESS RIGHT 12/23/2015 Jacqulynn Cadet, MD [facility]-INTERV RAD    OB History    Gravida Para Term Preterm AB Living   5 2 2  0 3 2   SAB TAB Ectopic Multiple Live Births   3 0 0 0 2      Obstetric Comments   Miscarried in October 2012 at about 7 weeks       Home Medications    Prior to Admission medications   Medication Sig Start Date End Date Taking? Authorizing Provider  Biotin 1 MG CAPS Take 2 mg by mouth daily.    [provider]  hydroxyurea (HYDREA) 500 MG capsule Take 2 capsules (1,000 mg total) by mouth daily. May  take with food to minimize GI side effects. Patient taking differently: Take 1,000 mg by mouth 2 (two) times daily. May take with food to minimize GI side effects. 04/18/16   Tresa Garter, MD  medroxyPROGESTERone (DEPO-PROVERA) 150 MG/ML injection Inject 1 mL (150 mg total) into the muscle every 3 (three) months. 09/21/16   Shelly Bombard, MD  morphine (MS CONTIN) 60 MG 12 hr tablet Take 60 mg by mouth every 12 (twelve) hours as needed for pain.     [provider]  Multiple Vitamins-Minerals ([employer] DAILY ENERGY) TABS Take 2 tablets by mouth daily.    [provider]  oxyCODONE (ROXICODONE) 15 MG immediate release tablet Take 15 mg by mouth every 4 (four) hours as needed for pain.     [provider]  rivaroxaban (XARELTO) 20 MG TABS  tablet Take 20 mg by mouth daily with supper.     [provider]  valACYclovir (VALTREX) 1000 MG tablet Take 1 tablet (1,000 mg total) by mouth daily. 05/02/16   [name], MD    Family History Family History  Problem Relation Age of Onset  . Sickle cell trait Mother   . Sickle cell trait Father   . Diabetes Maternal Grandmother   . Diabetes Paternal Grandmother   . Hypertension Paternal Grandmother   . Diabetes Maternal Grandfather     Social History Social History  Substance Use Topics  . Smoking status: Former Smoker    Packs/day: 0.25    Years: 1.00    Types: Cigarettes    Quit date: 03/25/2013  . Smokeless tobacco: Never Used  . Alcohol use Yes     Comment: Twice a month     Allergies   Food and Latex   Review of Systems Review of Systems  Constitutional: Negative for fever.  Respiratory: Negative for shortness of breath.   Cardiovascular: Negative for chest pain.  Musculoskeletal:       Left leg and arm pain   Allergic/Immunologic: Positive for immunocompromised state.  Hematological: Bruises/bleeds easily.  Psychiatric/Behavioral: Negative for confusion.  All other systems reviewed and are negative.    Physical Exam Updated Vital Signs Ht 5\' 1"  (1.549 m)   Wt 94.3 kg (208 lb)   BMI 39.30 kg/m   Physical Exam Physical Exam  Nursing note and vitals reviewed. Constitutional: Well developed, well nourished, non-toxic, and in no acute distress Head: Normocephalic and atraumatic.  Mouth/Throat: Oropharynx is clear and moist.  Neck: Normal range of motion. Neck supple.  Cardiovascular: Normal rate and regular rhythm.   Pulmonary/Chest: Effort normal and breath sounds normal.  Abdominal: Soft. There is no tenderness. There is no rebound and no guarding.  Musculoskeletal: Normal range of motion.  Neurological: Alert, no facial droop, fluent speech, moves all extremities symmetrically Skin: Skin is warm and dry.  Psychiatric:  Cooperative   ED Treatments / Results  Labs (all labs ordered are listed, but only abnormal results are displayed) Labs Reviewed - No data to display  EKG  EKG Interpretation None       Radiology No results found.  Procedures Procedures (including critical care time)  Medications Ordered in ED Medications - No data to display   Initial Impression / Assessment and Plan / ED Course  I have reviewed the triage vital signs and the nursing notes.  Pertinent labs & imaging results that were available during my care of the patient were reviewed by me and considered in my medical decision making (see  chart for details).     Just discharged from the hospital this morning for sickle cell pain crisis, and she reports persistent pain. Inpatient doctor, Dr. Jonelle Sidle had spoken with [facility], stating that patient well appearing this morning and appropriate for discharge. Stated that she would not benefit coming back into the hospital. Patient had been comfortably sleeping in the room and in triage, prior to my evaluation and initially comfortable appearing. I feel that she is appropriate for management at sickle cell clinic for pain management if she still has ongoing symptoms. I discussed this with the patient who agrees with the plan.  Final Clinical Impressions(s) / ED Diagnoses   Final diagnoses:  None    New Prescriptions New Prescriptions   No medications on file     Forde Dandy, MD 10/05/16 1113

## 2016-10-06 ENCOUNTER — Emergency Department (HOSPITAL_COMMUNITY): Payer: Medicare Other

## 2016-10-06 ENCOUNTER — Inpatient Hospital Stay (HOSPITAL_COMMUNITY)
Admission: EM | Admit: 2016-10-06 | Discharge: 2016-10-09 | DRG: 812 | Disposition: A | Discharge status: Home / Self Care | Payer: Medicare Other | Attending: Internal Medicine | Admitting: Internal Medicine

## 2016-10-06 ENCOUNTER — Encounter (HOSPITAL_COMMUNITY): Payer: Self-pay | Admitting: Emergency Medicine

## 2016-10-06 DIAGNOSIS — Z9119 Patient's noncompliance with other medical treatment and regimen: Secondary | ICD-10-CM | POA: Diagnosis not present

## 2016-10-06 DIAGNOSIS — F1190 Opioid use, unspecified, uncomplicated: Secondary | ICD-10-CM

## 2016-10-06 DIAGNOSIS — M879 Osteonecrosis, unspecified: Secondary | ICD-10-CM | POA: Diagnosis present

## 2016-10-06 DIAGNOSIS — Z91040 Latex allergy status: Secondary | ICD-10-CM | POA: Diagnosis not present

## 2016-10-06 DIAGNOSIS — M25511 Pain in right shoulder: Secondary | ICD-10-CM | POA: Diagnosis not present

## 2016-10-06 DIAGNOSIS — Z7901 Long term (current) use of anticoagulants: Secondary | ICD-10-CM

## 2016-10-06 DIAGNOSIS — M549 Dorsalgia, unspecified: Secondary | ICD-10-CM | POA: Diagnosis present

## 2016-10-06 DIAGNOSIS — K219 Gastro-esophageal reflux disease without esophagitis: Secondary | ICD-10-CM | POA: Diagnosis present

## 2016-10-06 DIAGNOSIS — D638 Anemia in other chronic diseases classified elsewhere: Secondary | ICD-10-CM | POA: Diagnosis not present

## 2016-10-06 DIAGNOSIS — D5700 Hb-SS disease with crisis, unspecified: Secondary | ICD-10-CM | POA: Diagnosis present

## 2016-10-06 DIAGNOSIS — D72829 Elevated white blood cell count, unspecified: Secondary | ICD-10-CM | POA: Diagnosis present

## 2016-10-06 DIAGNOSIS — Z91018 Allergy to other foods: Secondary | ICD-10-CM | POA: Diagnosis not present

## 2016-10-06 DIAGNOSIS — Z86718 Personal history of other venous thrombosis and embolism: Secondary | ICD-10-CM | POA: Diagnosis not present

## 2016-10-06 DIAGNOSIS — M8700 Idiopathic aseptic necrosis of unspecified bone: Secondary | ICD-10-CM

## 2016-10-06 DIAGNOSIS — Z79899 Other long term (current) drug therapy: Secondary | ICD-10-CM | POA: Diagnosis not present

## 2016-10-06 DIAGNOSIS — G894 Chronic pain syndrome: Secondary | ICD-10-CM | POA: Diagnosis present

## 2016-10-06 DIAGNOSIS — Z87891 Personal history of nicotine dependence: Secondary | ICD-10-CM | POA: Diagnosis not present

## 2016-10-06 LAB — COMPREHENSIVE METABOLIC PANEL
ALBUMIN: 3.9 g/dL (ref 3.5–5.0)
ALT: 21 U/L (ref 14–54)
AST: 25 U/L (ref 15–41)
Alkaline Phosphatase: 60 U/L (ref 38–126)
Anion gap: 9 (ref 5–15)
BILIRUBIN TOTAL: 3.5 mg/dL — AB (ref 0.3–1.2)
BUN: 6 mg/dL (ref 6–20)
CO2: 26 mmol/L (ref 22–32)
CREATININE: 0.55 mg/dL (ref 0.44–1.00)
Calcium: 9 mg/dL (ref 8.9–10.3)
Chloride: 107 mmol/L (ref 101–111)
GFR calc Af Amer: >60 mL/min (ref >60)
GLUCOSE: 104 mg/dL — AB (ref 65–99)
Potassium: 3.3 mmol/L — ABNORMAL LOW (ref 3.5–5.1)
Sodium: 142 mmol/L (ref 135–145)
TOTAL PROTEIN: 7.2 g/dL (ref 6.5–8.1)

## 2016-10-06 LAB — CBC WITH DIFFERENTIAL/PLATELET
BASOS ABS: 0.1 10*3/uL (ref 0.0–0.1)
BASOS PCT: 1 %
Eosinophils Absolute: 0.3 10*3/uL (ref 0.0–0.7)
Eosinophils Relative: 2 %
HCT: 20.9 % — ABNORMAL LOW (ref 36.0–46.0)
Hemoglobin: 7.2 g/dL — ABNORMAL LOW (ref 12.0–15.0)
LYMPHS ABS: 2.2 10*3/uL (ref 0.7–4.0)
Lymphocytes Relative: 15 %
MCH: 30.1 pg (ref 26.0–34.0)
MCHC: 34.4 g/dL (ref 30.0–36.0)
MCV: 87.4 fL (ref 78.0–100.0)
MONO ABS: 1.5 10*3/uL — AB (ref 0.1–1.0)
Monocytes Relative: 10 %
NEUTROS PCT: 72 %
Neutro Abs: 10.8 10*3/uL — ABNORMAL HIGH (ref 1.7–7.7)
PLATELETS: 603 10*3/uL — AB (ref 150–400)
RBC: 2.39 MIL/uL — ABNORMAL LOW (ref 3.87–5.11)
RDW: 23.8 % — AB (ref 11.5–15.5)
WBC: 14.9 10*3/uL — AB (ref 4.0–10.5)
nRBC: 4 /100 WBC — ABNORMAL HIGH

## 2016-10-06 LAB — RETICULOCYTES
RBC.: 2.39 MIL/uL — ABNORMAL LOW (ref 3.87–5.11)
RETIC COUNT ABSOLUTE: 497.1 10*3/uL — AB (ref 19.0–186.0)
Retic Ct Pct: 20.8 % — ABNORMAL HIGH (ref 0.4–3.1)

## 2016-10-06 MED ORDER — DIPHENHYDRAMINE HCL 25 MG PO CAPS: 25.0000 mg | ORAL_CAPSULE | ORAL | Status: DC | PRN

## 2016-10-06 MED ORDER — HYDROMORPHONE HCL 1 MG/ML IJ SOLN
2.0000 mg | INTRAMUSCULAR | Status: AC
  Administered 2016-10-06: 2 mg via INTRAVENOUS
  Filled 2016-10-06: qty 2

## 2016-10-06 MED ORDER — HYDROXYUREA 500 MG PO CAPS
1000.0000 mg | ORAL_CAPSULE | Freq: Two times a day (BID) | ORAL | Status: DC
  Administered 2016-10-06 – 2016-10-07 (×2): 1000 mg via ORAL
  Filled 2016-10-06 (×2): qty 2

## 2016-10-06 MED ORDER — HYDROMORPHONE HCL 1 MG/ML IJ SOLN: 2.0000 mg | INTRAMUSCULAR | Status: AC

## 2016-10-06 MED ORDER — ONDANSETRON HCL 4 MG/2ML IJ SOLN
4.0000 mg | INTRAMUSCULAR | Status: DC | PRN
  Administered 2016-10-06: 4 mg via INTRAVENOUS
  Filled 2016-10-06: qty 2

## 2016-10-06 MED ORDER — SODIUM CHLORIDE 0.9 % IV SOLN
25.0000 mg | INTRAVENOUS | Status: DC | PRN
  Filled 2016-10-06: qty 0.5

## 2016-10-06 MED ORDER — BIOTIN 1 MG PO CAPS: 2.0000 mg | ORAL_CAPSULE | Freq: Every day | ORAL | Status: DC

## 2016-10-06 MED ORDER — SENNOSIDES-DOCUSATE SODIUM 8.6-50 MG PO TABS
1.0000 | ORAL_TABLET | Freq: Two times a day (BID) | ORAL | Status: DC
  Administered 2016-10-06 – 2016-10-09 (×6): 1 via ORAL
  Filled 2016-10-06 (×6): qty 1

## 2016-10-06 MED ORDER — MORPHINE SULFATE ER 30 MG PO TBCR
60.0000 mg | EXTENDED_RELEASE_TABLET | Freq: Two times a day (BID) | ORAL | Status: DC
  Administered 2016-10-06 – 2016-10-09 (×6): 60 mg via ORAL
  Filled 2016-10-06 (×6): qty 2

## 2016-10-06 MED ORDER — NALOXONE HCL 0.4 MG/ML IJ SOLN: 0.4000 mg | INTRAMUSCULAR | Status: DC | PRN

## 2016-10-06 MED ORDER — SODIUM CHLORIDE 0.9% FLUSH: 9.0000 mL | INTRAVENOUS | Status: DC | PRN

## 2016-10-06 MED ORDER — DEXTROSE-NACL 5-0.45 % IV SOLN
INTRAVENOUS | Status: DC
  Administered 2016-10-06: 13:00:00 via INTRAVENOUS

## 2016-10-06 MED ORDER — HYDROMORPHONE HCL 1 MG/ML IJ SOLN: 2.0000 mg | INTRAMUSCULAR | Status: DC

## 2016-10-06 MED ORDER — DIPHENHYDRAMINE HCL 25 MG PO CAPS
25.0000 mg | ORAL_CAPSULE | ORAL | Status: DC | PRN
  Administered 2016-10-06 (×2): 25 mg via ORAL
  Filled 2016-10-06 (×2): qty 1

## 2016-10-06 MED ORDER — ONDANSETRON HCL 4 MG/2ML IJ SOLN: 4.0000 mg | Freq: Four times a day (QID) | INTRAMUSCULAR | Status: DC | PRN

## 2016-10-06 MED ORDER — HYDROMORPHONE 1 MG/ML IV SOLN
INTRAVENOUS | Status: DC
  Administered 2016-10-06: 6.6 mg via INTRAVENOUS
  Administered 2016-10-06: 25 mg via INTRAVENOUS
  Administered 2016-10-07: 12 mg via INTRAVENOUS
  Administered 2016-10-07: 4.2 mg via INTRAVENOUS
  Administered 2016-10-07: 3.6 mg via INTRAVENOUS
  Administered 2016-10-07: 9.6 mg via INTRAVENOUS
  Administered 2016-10-07: 25 mg via INTRAVENOUS
  Administered 2016-10-07: 7.8 mg via INTRAVENOUS
  Administered 2016-10-07: 6 mg via INTRAVENOUS
  Administered 2016-10-07 – 2016-10-08 (×2): 25 mg via INTRAVENOUS
  Administered 2016-10-08: 7.8 mg via INTRAVENOUS
  Administered 2016-10-08: 25 mg via INTRAVENOUS
  Administered 2016-10-08: 14.4 mg via INTRAVENOUS
  Administered 2016-10-08: 25 mg via INTRAVENOUS
  Administered 2016-10-08: 9 mg via INTRAVENOUS
  Administered 2016-10-08: 12 mg via INTRAVENOUS
  Administered 2016-10-08: 1.8 mg via INTRAVENOUS
  Administered 2016-10-08: 0.6 mg via INTRAVENOUS
  Administered 2016-10-09: 13.06 mg via INTRAVENOUS
  Administered 2016-10-09: 9.53 mg via INTRAVENOUS
  Filled 2016-10-06 (×7): qty 25

## 2016-10-06 MED ORDER — RIVAROXABAN 20 MG PO TABS
20.0000 mg | ORAL_TABLET | Freq: Every day | ORAL | Status: DC
  Administered 2016-10-06 – 2016-10-08 (×3): 20 mg via ORAL
  Filled 2016-10-06 (×3): qty 1

## 2016-10-06 MED ORDER — KETOROLAC TROMETHAMINE 30 MG/ML IJ SOLN
30.0000 mg | INTRAMUSCULAR | Status: AC
  Administered 2016-10-06: 30 mg via INTRAVENOUS
  Filled 2016-10-06: qty 1

## 2016-10-06 MED ORDER — HYDROMORPHONE HCL 1 MG/ML IJ SOLN
2.0000 mg | INTRAMUSCULAR | Status: DC
  Administered 2016-10-06: 2 mg via INTRAVENOUS
  Filled 2016-10-06: qty 2

## 2016-10-06 MED ORDER — POLYETHYLENE GLYCOL 3350 17 G PO PACK: 17.0000 g | PACK | Freq: Every day | ORAL | Status: DC | PRN

## 2016-10-06 MED ORDER — VALACYCLOVIR HCL 500 MG PO TABS
1000.0000 mg | ORAL_TABLET | Freq: Every day | ORAL | Status: DC
  Administered 2016-10-06 – 2016-10-09 (×4): 1000 mg via ORAL
  Filled 2016-10-06 (×4): qty 2

## 2016-10-06 MED ORDER — KETOROLAC TROMETHAMINE 15 MG/ML IJ SOLN
15.0000 mg | Freq: Four times a day (QID) | INTRAMUSCULAR | Status: DC
  Administered 2016-10-06 – 2016-10-09 (×11): 15 mg via INTRAVENOUS
  Filled 2016-10-06 (×11): qty 1

## 2016-10-06 NOTE — ED Triage Notes (Signed)
Pt c/o sickle cell pain crisis, states she was admitted 5/20 and d/c yesterday but pain continues, pain worse in R arm. Pt states she had headache most of yesterday and took Excedrin which helped head.

## 2016-10-06 NOTE — H&P (Signed)
Hospital Admission Note Date: 10/06/2016  Patient name: [name] Medical record number: [id number] Date of birth: 08/08/1990 Age: 26 y.o. Gender: female PCP: Ricke Hey, MD  Attending physician: [name], MD  Chief Complaint:Right Shoulder Pain  History of Present Illness: [name] is an opiate tolerant patient with Hb SS who was discharged from the hospital yesterday after treatment for Sickle Cell Crisis. She states that  Upon arriving home her pain escalated in her right shoulder and has been at 10/10 ever since. She states that the pain is throbbing and characteristic of sickle cell related pain. She reports that it is preventing her from performing ADL's and she is unable to pick up and hold her children. She denies any fevers, chills, vomiting or diarrhea. She also denies any CP, dizziness or syncope.   In the ED she received 4 doses of Dilaudid and 1 dose of Toradol. However the pain is still at 10/10. 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 I am asked to admit patient for management of Sickle Cell Crisis.  Scheduled Meds: .  HYDROmorphone (DILAUDID) injection  2 mg Intravenous Q1H   Or  .  HYDROmorphone (DILAUDID) injection  2 mg Subcutaneous Q1H   Continuous Infusions: . dextrose 5 % and 0.45% NaCl 100 mL/hr at 10/06/16 1308   PRN Meds:.diphenhydrAMINE, ondansetron Allergies: Food and Latex Past Medical History:  Diagnosis Date  . Blood transfusion    "lots"  . Blood transfusion without reported diagnosis   . Chronic back pain    "very severe; have knot in my back; from tight muscle; take RX and exercise for it"  . Depression 01/06/2011  . Exertional dyspnea    "sometimes"  . Genital HSV   . GERD (gastroesophageal reflux disease) 02/17/2011  . Migraines 11/08/11   "@ least twice/month"  . 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  . Mood swings ([place]) 11/08/11   "I go back and forth; real  bad"  . Sickle cell anemia ([name])   . Sickle cell anemia with crisis ([place])   . Trichotillomania    h/o   Past Surgical History:  Procedure Laterality Date  . CHOLECYSTECTOMY  05/2010  . DILATION AND CURETTAGE OF UTERUS  02/20/11   S/P miscarriage  . IR GENERIC HISTORICAL  12/23/2015   IR FLUORO GUIDE CV LINE RIGHT 12/23/2015 Jacqulynn Cadet, MD [facility]  . IR GENERIC HISTORICAL  12/23/2015   IR US GUIDE VASC ACCESS RIGHT 12/23/2015 Jacqulynn Cadet, MD [facility]   Family History  Problem Relation Age of Onset  . Sickle cell trait Mother   . Sickle cell trait Father   . Diabetes Maternal Grandmother   . Diabetes Paternal Grandmother   . Hypertension Paternal Grandmother   . Diabetes Maternal Grandfather    Social History   Social History  . Marital status: Single    Spouse name: N/A  . Number of children: N/A  . Years of education: N/A   Occupational History  . Not on file.   Social History Main Topics  . Smoking status: Former Smoker    Packs/day: 0.25    Years: 1.00    Types: Cigarettes    Quit date: 03/25/2013  . Smokeless tobacco: Never Used  . Alcohol use Yes     Comment: Twice a month  . Drug use: No     Comment: "Clean for 3 months"  . Sexual activity: Yes  Partners: Male    Birth control/ protection: None     Comment: last sex Jul 11 2015   Other Topics Concern  . Not on file   Social History Narrative   Lives  With mother   FOB is supportive-supposed to be moving next month   Is a student at [employer] time   Review of Systems: Pertinent items noted in HPI and remainder of comprehensive ROS otherwise negative. Physical Exam: No intake or output data in the 24 hours ending 10/06/16 1627 General: Alert, awake, oriented x3, in no acute distress.  HEENT: [place]/AT PEERL, EOMI Neck: Trachea midline,  no masses, no thyromegal,y no JVD, no carotid bruit OROPHARYNX:  Moist, No exudate/ erythema/lesions.  Heart: Regular rate and rhythm, without murmurs,  rubs, gallops, PMI non-displaced, no heaves or thrills on palpation.  Lungs: Clear to auscultation, no wheezing or rhonchi noted. No increased vocal fremitus resonant to percussion  Abdomen: Soft, nontender, nondistended, positive bowel sounds, no masses no hepatosplenomegaly noted..  Neuro: No focal neurological deficits noted cranial nerves II through XII grossly intact. DTRs 2+ bilaterally upper and lower extremities. Strength 5 out of 5 in bilateral upper and lower extremities. Musculoskeletal: No warm swelling or erythema around joints, no spinal tenderness noted. Psychiatric: Patient alert and oriented x3, good insight and cognition, good recent to remote recall. Lymph node survey: No cervical axillary or inguinal lymphadenopathy noted.  Lab results:  Recent Labs  10/06/16 1312  NA 142  K 3.3*  CL 107  CO2 26  GLUCOSE 104*  BUN 6  CREATININE 0.55  CALCIUM 9.0    Recent Labs  10/06/16 1312  AST 25  ALT 21  ALKPHOS 60  BILITOT 3.5*  PROT 7.2  ALBUMIN 3.9   No results for input(s): LIPASE, AMYLASE in the last 72 hours.  Recent Labs  10/05/16 0730 10/06/16 1312  WBC 15.1* 14.9*  NEUTROABS 9.9* 10.8*  HGB 6.5* 7.2*  HCT 19.0* 20.9*  MCV 85.2 87.4  PLT 501* 603*   No results for input(s): CKTOTAL, CKMB, CKMBINDEX, TROPONINI in the last 72 hours. Invalid input(s): POCBNP No results for input(s): DDIMER in the last 72 hours. No results for input(s): HGBA1C in the last 72 hours. No results for input(s): CHOL, HDL, LDLCALC, TRIG, CHOLHDL, LDLDIRECT in the last 72 hours. No results for input(s): TSH, T4TOTAL, T3FREE, THYROIDAB in the last 72 hours.  Invalid input(s): FREET3  Recent Labs  10/06/16 1312  RETICCTPCT 20.8*   Imaging results:  Dg Shoulder Right  Result Date: 10/06/2016 CLINICAL DATA:  Right shoulder pain for over a month. History of sickle cell disease. EXAM: RIGHT SHOULDER - 2+ VIEW COMPARISON:  08/27/2012 FINDINGS: Sclerosis in the head of the  right humerus consistent with avascular necrosis, stable in extent from prior. No evidence of fractures/ collapse. No superimposed degenerative changes or malalignment. Porta catheter on the right.  Mild right upper lobe scarring. 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 No acute superimposed finding or degenerative change. Electronically Signed   By: Monte Fantasia M.D.   On: 10/06/2016 13:48     Assessment and Plan: 1. Hb SS with crisis: Start PCA, Low dose Toradol and limited IVF  2. AVN right shoulder: Treat pain as above. She may need a referral to Orthopedic Surgery. 3. Chronic Pain: Continue MS Contin 4. Anemia of Chronic Disease: Hb at baseline.  5. Leukocytosis: No evidence of infection.Likely related to crisis.  6. Chronic Anticoagulation: Continue Xarelto  7. Medical Non-Adherence: MCV of blood cells are incongruent with adherence to Hydrea. Particularly when her MCV  in the past did increase to >100 in response to Hydrea.   MATTHEWS,MICHELLE A. 10/06/2016, 4:27 PM    MATTHEWS,MICHELLE A.  Pager [phone]. If 7PM-7AM, please contact night-coverage.

## 2016-10-06 NOTE — ED Notes (Signed)
Pt requesting EMT to take her keys to boyfriend Mike Gip in fast track.

## 2016-10-06 NOTE — ED Provider Notes (Signed)
[facility] DEPT Provider Note   CSN: [id number] Arrival date & time: 10/06/16  1034     History   Chief Complaint Chief Complaint  Patient presents with  . Sickle Cell Pain Crisis    HPI [name] is a 26 y.o. female.  HPI  26 year old female with a history of sickle cell presents with severe right shoulder pain. She was admitted 4 days ago and then discharged yesterday. She states she feels like she was discharged to early. One day after being admitted she started developing right shoulder pain which is a recurrent issue over the past 1 month. She states she's never had an x-ray. The pain is so severe she cannot lift her arm fully and cannot pick up her children. Pain radiates from her shoulder to her elbow. Her elbow moves normally. She has not noticed any current swelling. No injuries. She also has back pain and other pain from her sickle cell pain crises but this is worse than those. She's been taking her home meds without relief. She is also having vomiting although this is significantly improved and now only has mild nausea. No fevers. No chest pain or pain near her port site.  Past Medical History:  Diagnosis Date  . Blood transfusion    "lots"  . Blood transfusion without reported diagnosis   . Chronic back pain    "very severe; have knot in my back; from tight muscle; take RX and exercise for it"  . Depression 01/06/2011  . Exertional dyspnea    "sometimes"  . Genital HSV   . GERD (gastroesophageal reflux disease) 02/17/2011  . Migraines 11/08/11   "@ least twice/month"  . 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  . Mood swings ([place]) 11/08/11   "I go back and forth; real bad"  . Sickle cell anemia ([name])   . Sickle cell anemia with crisis ([place])   . Trichotillomania    h/o    Patient Active Problem List   Diagnosis Date Noted  . Sickle cell anemia with crisis ([place]) 10/02/2016  . Hb-SS disease with crisis ([place]) 08/20/2016  . Chronic  anticoagulation   . Sickle cell anemia ([place]) 05/19/2016  . History of DVT (deep vein thrombosis) 04/17/2016  . Thrombocytosis ([place]) 11/09/2015  . Hyperbilirubinemia 11/09/2015  . Chronic pain 08/04/2015  . Herpes simplex 07/14/2015  . Anemia of chronic disease   . Sickle cell crisis ([place]) 07/31/2013  . Hb-SS disease without crisis ([place]) 02/26/2013  . 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 Sickle cell disease ([place]) 01/08/2009  . 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 CHOLECYSTECTOMY  05/2010  . DILATION AND CURETTAGE OF UTERUS  02/20/11   S/P miscarriage  . IR GENERIC HISTORICAL  12/23/2015   IR FLUORO GUIDE CV LINE RIGHT 12/23/2015 Jac[facility] Cadet, MD [facility]  . IR GENERIC HISTORICAL  12/23/2015   IR US GUIDE VASC ACCESS RIGHT 12/23/2015 [facility]lynn Cadet, MD [facility]    OB History    Gravida Para Term Preterm AB Living   5 2 2  0 3 2   SAB TAB Ectopic Multiple Live Births   3 0 0 0 2      Obstetric Comments   Miscarried in October 2012 at about 7 weeks       Home Medications    Prior to Admission medications   Medication Sig Start Date End Date Taking? Authorizing Provider  Biotin 1 MG CAPS Take 2  mg by mouth daily.   Yes [provider]  hydroxyurea (HYDREA) 500 MG capsule Take 2 capsules (1,000 mg total) by mouth daily. May take with food to minimize GI side effects. Patient taking differently: Take 1,000 mg by mouth 2 (two) times daily. May take with food to minimize GI side effects. 04/18/16  Yes Tresa Garter, MD  medroxyPROGESTERone (DEPO-PROVERA) 150 MG/ML injection Inject 1 mL (150 mg total) into the muscle every 3 (three) months. 09/21/16  Yes Shelly Bombard, MD  morphine (MS CONTIN) 60 MG 12 hr tablet Take 60 mg by mouth every 12 (twelve) hours as needed for pain.    Yes [provider]  oxyCODONE (ROXICODONE) 15 MG immediate release tablet Take 15 mg by mouth every 4 (four) hours as  needed for pain.    Yes [provider]  rivaroxaban (XARELTO) 20 MG TABS tablet Take 20 mg by mouth daily with supper.    Yes [provider]  valACYclovir (VALTREX) 1000 MG tablet Take 1 tablet (1,000 mg total) by mouth daily. 05/02/16  Yes [name], MD    Family History Family History  Problem Relation Age of Onset  . Sickle cell trait Mother   . Sickle cell trait Father   . Diabetes Maternal Grandmother   . Diabetes Paternal Grandmother   . Hypertension Paternal Grandmother   . Diabetes Maternal Grandfather     Social History Social History  Substance Use Topics  . Smoking status: Former Smoker    Packs/day: 0.25    Years: 1.00    Types: Cigarettes    Quit date: 03/25/2013  . Smokeless tobacco: Never Used  . Alcohol use Yes     Comment: Twice a month     Allergies   Food and Latex   Review of Systems Review of Systems   Physical Exam Updated Vital Signs BP 123/80 (BP Location: Left Arm)   Pulse (!) 102   Temp 98.7 F (37.1 C) (Oral)   Resp 16   LMP 10/04/2016 (Exact Date)   SpO2 96%   Physical Exam  Constitutional: She is oriented to person, place, and time. She appears well-developed and well-nourished.  HENT:  Head: Normocephalic and atraumatic.  Right Ear: External ear normal.  Left Ear: External ear normal.  Nose: Nose normal.  Eyes: Right eye exhibits no discharge. Left eye exhibits no discharge.  Cardiovascular: Normal rate, regular rhythm and normal heart sounds.   Pulses:      Radial pulses are 2+ on the right side.  Pulmonary/Chest: Effort normal and breath sounds normal.  Right chest port without swelling, redness or tenderness  Abdominal: Soft. There is no tenderness.  Musculoskeletal:       Right shoulder: She exhibits decreased range of motion and tenderness. She exhibits no swelling, no deformity, normal pulse and normal strength.       Right elbow: She exhibits normal range of motion. No tenderness found.        Right upper arm: She exhibits no tenderness.  Normal strength and sensation in RUE. R shoulder decreased ROM at least partially pain related.  Neurological: She is alert and oriented to person, place, and time.  Skin: Skin is warm and dry.  Nursing note and vitals reviewed.    ED Treatments / Results  Labs (all labs ordered are listed, but only abnormal results are displayed) Labs Reviewed  CBC WITH DIFFERENTIAL/PLATELET - Abnormal; Notable for the following:       Result  Value   WBC 14.9 (*)    RBC 2.39 (*)    Hemoglobin 7.2 (*)    HCT 20.9 (*)    RDW 23.8 (*)    Platelets 603 (*)    nRBC 4 (*)    Neutro Abs 10.8 (*)    Monocytes Absolute 1.5 (*)    All other components within normal limits  RETICULOCYTES - Abnormal; Notable for the following:    Retic Ct Pct 20.8 (*)    RBC. 2.39 (*)    Retic Count, Manual 497.1 (*)    All other components within normal limits  COMPREHENSIVE METABOLIC PANEL - Abnormal; Notable for the following:    Potassium 3.3 (*)    Glucose, Bld 104 (*)    Total Bilirubin 3.5 (*)    All other components within normal limits    EKG  EKG Interpretation None       Radiology Dg Shoulder Right  Result Date: 10/06/2016 CLINICAL DATA:  Right shoulder pain for over a month. History of sickle cell disease. EXAM: RIGHT SHOULDER - 2+ VIEW COMPARISON:  08/27/2012 FINDINGS: Sclerosis in the head of the right humerus consistent with avascular necrosis, stable in extent from prior. No evidence of fractures/ collapse. No superimposed degenerative changes or malalignment. Porta catheter on the right.  Mild right upper lobe scarring. 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 No acute superimposed finding or degenerative change. Electronically Signed   By: Monte Fantasia M.D.   On: 10/06/2016 13:48    Procedures Procedures (including critical care time)  Medications Ordered in ED Medications  morphine (MS CONTIN) 12 hr tablet  60 mg (not administered)  rivaroxaban (XARELTO) tablet 20 mg (20 mg Oral Given 10/06/16 1830)  valACYclovir (VALTREX) tablet 1,000 mg (1,000 mg Oral Given 10/06/16 1950)  hydroxyurea (HYDREA) capsule 1,000 mg (not administered)  senna-docusate (Senokot-S) tablet 1 tablet (not administered)  polyethylene glycol (MIRALAX / GLYCOLAX) packet 17 g (not administered)  ketorolac (TORADOL) 15 MG/ML injection 15 mg (15 mg Intravenous Given 10/06/16 1830)  naloxone (NARCAN) injection 0.4 mg (not administered)    And  sodium chloride flush (NS) 0.9 % injection 9 mL (not administered)  ondansetron (ZOFRAN) injection 4 mg (not administered)  diphenhydrAMINE (BENADRYL) capsule 25-50 mg (not administered)    Or  diphenhydrAMINE (BENADRYL) 25 mg in sodium chloride 0.9 % 50 mL IVPB (not administered)  HYDROmorphone (DILAUDID) 1 mg/mL PCA injection (6.6 mg Intravenous Received 10/06/16 1949)  ketorolac (TORADOL) 30 MG/ML injection 30 mg (30 mg Intravenous Given 10/06/16 1244)  HYDROmorphone (DILAUDID) injection 2 mg (2 mg Intravenous Given 10/06/16 1248)    Or  HYDROmorphone (DILAUDID) injection 2 mg ( Subcutaneous See Alternative 10/06/16 1248)  HYDROmorphone (DILAUDID) injection 2 mg (2 mg Intravenous Given 10/06/16 1339)    Or  HYDROmorphone (DILAUDID) injection 2 mg ( Subcutaneous See Alternative 10/06/16 1339)  HYDROmorphone (DILAUDID) injection 2 mg (2 mg Intravenous Given 10/06/16 1442)    Or  HYDROmorphone (DILAUDID) injection 2 mg ( Subcutaneous See Alternative 10/06/16 1442)     Initial Impression / Assessment and Plan / ED Course  I have reviewed the triage vital signs and the nursing notes.  Pertinent labs & imaging results that were available during my care of the patient were reviewed by me and considered in my medical decision making (see chart for details).     Xray shows chronic changes to shoulder. Pain minimally improved. Patient initially wanted to go home but in  too much pain and changed  her mind. Afebrile, no signs of more severe or other disease than her sickle cell. Admit.  Final Clinical Impressions(s) / ED Diagnoses   Final diagnoses:  Acute pain of right shoulder  Sickle cell pain crisis [facility])    New Prescriptions Current Discharge Medication List       Sherwood Gambler, MD 10/06/16 2110

## 2016-10-07 MED ORDER — L-GLUTAMINE ORAL POWDER
15.0000 g | PACK | Freq: Two times a day (BID) | ORAL | Status: DC
  Administered 2016-10-07 – 2016-10-09 (×5): 15 g via ORAL
  Filled 2016-10-07 (×5): qty 3

## 2016-10-07 MED ORDER — HYDROXYUREA 500 MG PO CAPS
1500.0000 mg | ORAL_CAPSULE | Freq: Every day | ORAL | Status: DC
  Administered 2016-10-08 – 2016-10-09 (×2): 1500 mg via ORAL
  Filled 2016-10-07 (×2): qty 3

## 2016-10-07 NOTE — Progress Notes (Signed)
SICKLE CELL SERVICE PROGRESS NOTE  [name] LZJ:[id number] DOB: 1990-08-02 DOA: 10/06/2016 PCP: Ricke Hey, MD  Assessment/Plan: Active Problems:   Hb-SS disease with crisis ([place])  1.  Hb SS with crisis:  Continue PCA at current dose and Toradol. Continue fluids at [facility]. 2. AVN Right shoulder: Pt has had AVN of the shoulder which has been present as far back as 2014. However, it appears that the pain has been exacerbated probably with lifting her children which of both are under 63 years of age. If pain continues, she will need to be evaluated by orthopedic surgery for consideration for intra-articular intervention. 3. Anemia of Chronic Disease: Hb was stable yesterday and there is no clinical indication of decreasing Hb. Will re-check labs tomorrow.  4. Chronic Pain Syndrome: Continue MS Contin. 5. SCD: Pt reports adherence with Hydrea however MCV does not reflect Hydrea use. Pt also prescribed Endari at home and has been taking it only when she has pain as she did not read the medication signatory to see how she should be taking it.Pt educated that Reatha Harps is to be taken BID and on a chronic basis and is not to be used as a rescue medication. I have added this to her home medications and will restart Endari.   Code Status: Full Code Family Communication: N/A Disposition Plan: Not yet ready for discharge  [place].  Pager [phone]. If 7PM-7AM, please contact night-coverage.  10/07/2016, 2:09 PM  LOS: 1 day   Interim History: Pt reports pain is at 8/10 and localized to right shoulder and arm. She has used  Mg of Dilaudid  with demands/deliveries since admission.Pt states that she had emesis after leaving the hospital 2 days ago. However she has had no emesis since presenting to the hospital yesterday and is tolerating oral intake well.   Consultants:  None  Procedures:  None  Antibiotics:  None    Objective: Vitals:   10/07/16 0506 10/07/16 0800  10/07/16 1015 10/07/16 1143  BP: 125/69  100/87   Pulse: (!) 101  82   Resp: 10 12 11 11   Temp: 98.5 F (36.9 C)  98.2 F (36.8 C)   TempSrc: Oral  Oral   SpO2: 98% 99% 98% 99%  Weight: 94.1 kg (207 lb 6.4 oz)      Weight change:   Intake/Output Summary (Last 24 hours) at 10/07/16 1409 Last data filed at 10/07/16 0506  Gross per 24 hour  Intake              625 ml  Output                0 ml  Net              625 ml     Physical Exam General: Alert, awake, oriented x3, in no acute distress.  HEENT: [place]/AT PEERL, EOMI, anicteric. Neck: Trachea midline,  no masses, no thyromegal,y no JVD, no carotid bruit OROPHARYNX:  Moist, No exudate/ erythema/lesions.  Heart: Regular rate and rhythm, without murmurs, rubs, gallops, PMI non-displaced, no heaves or thrills on palpation.  Lungs: Clear to auscultation, no wheezing or rhonchi noted. No increased vocal fremitus resonant to percussion  Abdomen: Soft, nontender, nondistended, positive bowel sounds, no masses no hepatosplenomegaly noted.  Neuro: No focal neurological deficits noted cranial nerves II through XII grossly intact. Strength at functional baseline in bilateral upper and lower extremities. Musculoskeletal: No warmth swelling or erythema around joints, no spinal tenderness noted. Psychiatric: Patient  alert and oriented x3, good insight and cognition, good recent to remote recall.   Data Reviewed: Basic Metabolic Panel:  Recent Labs Lab 10/02/16 1011 10/06/16 1312  NA 136 142  K 3.8 3.3*  CL 108 107  CO2 25 26  GLUCOSE 100* 104*  BUN 7 6  CREATININE 0.44 0.55  CALCIUM 8.6* 9.0   Liver Function Tests:  Recent Labs Lab 10/02/16 1011 10/06/16 1312  AST 17 25  ALT 15 21  ALKPHOS 62 60  BILITOT 2.1* 3.5*  PROT 7.2 7.2  ALBUMIN 4.2 3.9   No results for input(s): LIPASE, AMYLASE in the last 168 hours. No results for input(s): AMMONIA in the last 168 hours. CBC:  Recent Labs Lab 10/02/16 1011  10/04/16 0600 10/05/16 0730 10/06/16 1312  WBC 12.4* 16.4* 15.1* 14.9*  NEUTROABS 9.9* 11.2* 9.9* 10.8*  HGB 9.1* 6.7* 6.5* 7.2*  HCT 26.6* 19.4* 19.0* 20.9*  MCV 84.4 85.1 85.2 87.4  PLT 516* 352 501* 603*   Cardiac Enzymes: No results for input(s): CKTOTAL, CKMB, CKMBINDEX, TROPONINI in the last 168 hours. BNP (last 3 results) No results for input(s): BNP in the last 8760 hours.  ProBNP (last 3 results) No results for input(s): PROBNP in the last 8760 hours.  CBG: No results for input(s): GLUCAP in the last 168 hours.  No results found for this or any previous visit (from the past 240 hour(s)).   Studies: Dg Shoulder Right  Result Date: 10/06/2016 CLINICAL DATA:  Right shoulder pain for over a month. History of sickle cell disease. EXAM: RIGHT SHOULDER - 2+ VIEW COMPARISON:  08/27/2012 FINDINGS: Sclerosis in the head of the right humerus consistent with avascular necrosis, stable in extent from prior. No evidence of fractures/ collapse. No superimposed degenerative changes or malalignment. Porta catheter on the right.  Mild right upper lobe scarring. 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 No acute superimposed finding or degenerative change. Electronically Signed   By: Monte Fantasia M.D.   On: 10/06/2016 13:48    Scheduled Meds: . HYDROmorphone   Intravenous Q4H  . hydroxyurea  1,000 mg Oral BID  . ketorolac  15 mg Intravenous Q6H  . morphine  60 mg Oral Q12H  . rivaroxaban  20 mg Oral Q supper  . senna-docusate  1 tablet Oral BID  . valACYclovir  1,000 mg Oral Daily   Continuous Infusions: . diphenhydrAMINE (BENADRYL) IVPB(SICKLE CELL ONLY)      Active Problems:   Hb-SS disease with crisis ([place])    In excess of 25 minutes spent during this visit. Greater than 50% involved face to face contact with the patient for assessment, counseling and coordination of care.

## 2016-10-08 NOTE — Progress Notes (Signed)
Subjective: A 26 year old female admitted with sickle cell painful crisis. Patient was recently discharged from the hospital and came back with painful crisis. Her pain today is down to 5 out of 10. She has used only a 2 mg of the Dilaudid with 30 demands and 30 deliveries in the last 24 hours. Denied any fever or chills denied any nausea vomiting or diarrhea.  Objective: Vital signs in last 24 hours: Temp:  [98 F (36.7 C)-99.4 F (37.4 C)] 98.5 F (36.9 C) (05/26 1727) Pulse Rate:  [71-99] 88 (05/26 1727) Resp:  [11-18] 12 (05/26 1727) BP: (101-125)/(63-89) 125/89 (05/26 1727) SpO2:  [49 %-100 %] 98 % (05/26 1727) Weight:  [94.4 kg (208 lb 1.8 oz)] 94.4 kg (208 lb 1.8 oz) (05/26 0457) Weight change: 0.324 kg (11.4 oz) Last BM Date: 10/07/16  Intake/Output from previous day: 05/25 0701 - 05/26 0700 In: 1280 [P.O.:1280] Out: -  Intake/Output this shift: Total I/O In: 1320 [P.O.:1320] Out: -   General appearance: alert, cooperative, appears stated age and no distress Resp: clear to auscultation bilaterally Cardio: regular rate and rhythm, S1, S2 normal, no murmur, click, rub or gallop GI: soft, non-tender; bowel sounds normal; no masses,  no organomegaly Extremities: extremities normal, atraumatic, no cyanosis or edema Pulses: 2+ and symmetric Skin: Skin color, texture, turgor normal. No rashes or lesions Neurologic: Grossly normal  Lab Results:  Recent Labs  10/06/16 1312  WBC 14.9*  HGB 7.2*  HCT 20.9*  PLT 603*   BMET  Recent Labs  10/06/16 1312  NA 142  K 3.3*  CL 107  CO2 26  GLUCOSE 104*  BUN 6  CREATININE 0.55  CALCIUM 9.0    Studies/Results: No results found.  Medications: I have reviewed the patient's current medications.  Assessment/Plan: A 26 year old female admitted with sickle cell painful crisis.  #1 sickle cell painful crisis: Patient will continue with PCA and Toradol. She is not using much of the IV. We will augment her oral  medications.  #2 AVN of the right shoulder: This is partly responsible for her chronic pain. Continue pain medications.  #3 anemia of chronic disease: Hemoglobin appears stable. Continue current management  #4 sickle cell disease: Patient has had issues with compliance in the past. Continue Hydrea. Continue Endari   LOS: 2 days   GARBA,LAWAL 10/08/2016, 6:56 PM

## 2016-10-09 MED ORDER — SODIUM CHLORIDE 0.9% FLUSH: 10.0000 mL | INTRAVENOUS | Status: DC | PRN

## 2016-10-09 MED ORDER — HEPARIN SOD (PORK) LOCK FLUSH 100 UNIT/ML IV SOLN: 250.0000 [IU] | INTRAVENOUS | Status: DC | PRN

## 2016-10-09 MED ORDER — HEPARIN SOD (PORK) LOCK FLUSH 100 UNIT/ML IV SOLN
500.0000 [IU] | INTRAVENOUS | Status: DC | PRN
  Filled 2016-10-09: qty 5

## 2016-10-09 NOTE — Progress Notes (Signed)
Pt discharged home in stable condition.  Discharged from unit via wheelchair. Discharge instructions given.  Pt expressed understanding. No immediate questions or concerns at this time.

## 2016-10-09 NOTE — Discharge Summary (Signed)
Physician Discharge Summary  Patient ID: [name] MRN: [id number] DOB/AGE: 1990/10/29 26 y.o.  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)   Discharged Condition: good  Hospital Course: Patient is a 26 year old female that was admitted with sickle cell painful crisis. She was recently discharged from the hospital but came back. She has multiple medical problems especially the right shoulder where she has AVN. This been going on since 2014. Also anemia of chronic disease and chronic pain syndrome. Patient was treated with IV Dilaudid PCA with Toradol and IV fluids. She has progressively improved to where the pain is down to 4 out of 10 from 10 out of 10. She is also more functional at this point. She will be going home on current regimen. Patient also is to start Reatha Harps which she already has. She will resume her home regimen and continue with her primary care physician.  Consults: None  Significant Diagnostic Studies: labs: Serial CBCs and CMPs checked. Patient appears to be at her baseline.  Treatments: IV hydration and analgesia: acetaminophen and Dilaudid  Discharge Exam: Blood pressure 101/64, pulse 87, temperature 98.2 F (36.8 C), temperature source Oral, resp. rate 10, weight 94.8 kg (209 lb 1.6 oz), last menstrual period 10/04/2016, SpO2 96 %, not currently breastfeeding. General appearance: alert, cooperative, appears stated age and no distress Neck: no adenopathy, no carotid bruit, no JVD, supple, symmetrical, trachea midline and thyroid not enlarged, symmetric, no tenderness/mass/nodules Back: symmetric, no curvature. ROM normal. No CVA tenderness. Chest wall: no tenderness Cardio: regular rate and rhythm, S1, S2 normal, no murmur, click, rub or gallop GI: soft, non-tender; bowel sounds normal; no masses,  no organomegaly Extremities: extremities normal, atraumatic,  no cyanosis or edema Pulses: 2+ and symmetric Skin: Skin color, texture, turgor normal. No rashes or lesions Neurologic: Grossly normal  Disposition: 01-Home or Self Care  Discharge Instructions    Diet - low sodium heart healthy    Complete by:  As directed    Increase activity slowly    Complete by:  As directed      Allergies as of 10/09/2016      Reactions   Food Hives, Other (See Comments)   Pt is allergic to carrots.     Latex Rash      Medication List    TAKE these medications   Biotin 1 MG Caps Take 2 mg by mouth daily.   hydroxyurea 500 MG capsule Commonly known as:  HYDREA Take 2 capsules (1,000 mg total) by mouth daily. May take with food to minimize GI side effects. What changed:  how much to take  additional instructions   L-glutamine 5 g Pack Powder Packet Commonly known as:  ENDARI Take 15 g by mouth 2 (two) times daily.   medroxyPROGESTERone 150 MG/ML injection Commonly known as:  DEPO-PROVERA Inject 1 mL (150 mg total) into the muscle every 3 (three) months.   morphine 60 MG 12 hr tablet Commonly known as:  MS CONTIN Take 60 mg by mouth every 12 (twelve) hours as needed for pain.   oxyCODONE 15 MG immediate release tablet Commonly known as:  ROXICODONE Take 15 mg by mouth every 4 (four) hours as needed for pain.   rivaroxaban 20 MG Tabs tablet Commonly known as:  XARELTO Take 20 mg by mouth daily with supper.   valACYclovir 1000 MG tablet Commonly known as:  VALTREX Take 1 tablet (1,000 mg  total) by mouth daily.      Follow-up Information    McKenzie, Gwynneth Munson, MD. Schedule an appointment as soon as possible for a visit in 1 day.   Specialty:  Family Medicine Contact information: [place] [address] [place] DEPT.   Specialty:  Emergency Medicine Why:  If symptoms worsen Contact information: [place] [id number] [place] [address] [phone]          Signed: Barbette Merino 10/09/2016, 7:56 AM

## 2016-10-16 ENCOUNTER — Encounter (HOSPITAL_COMMUNITY): Payer: Self-pay

## 2016-10-16 ENCOUNTER — Emergency Department (HOSPITAL_COMMUNITY)
Admission: EM | Admit: 2016-10-16 | Discharge: 2016-10-16 | Disposition: A | Discharge status: Home / Self Care | Payer: Medicare Other | Attending: Emergency Medicine | Admitting: Emergency Medicine

## 2016-10-16 DIAGNOSIS — D5700 Hb-SS disease with crisis, unspecified: Secondary | ICD-10-CM

## 2016-10-16 DIAGNOSIS — Z87891 Personal history of nicotine dependence: Secondary | ICD-10-CM | POA: Diagnosis not present

## 2016-10-16 DIAGNOSIS — Z91040 Latex allergy status: Secondary | ICD-10-CM | POA: Insufficient documentation

## 2016-10-16 DIAGNOSIS — Z7901 Long term (current) use of anticoagulants: Secondary | ICD-10-CM | POA: Diagnosis not present

## 2016-10-16 DIAGNOSIS — Z79899 Other long term (current) drug therapy: Secondary | ICD-10-CM | POA: Diagnosis not present

## 2016-10-16 LAB — CBC WITH DIFFERENTIAL/PLATELET
BASOS ABS: 0.1 10*3/uL (ref 0.0–0.1)
Basophils Relative: 0 %
Eosinophils Absolute: 0.3 10*3/uL (ref 0.0–0.7)
Eosinophils Relative: 1 %
HEMATOCRIT: 24.3 % — AB (ref 36.0–46.0)
Hemoglobin: 8.2 g/dL — ABNORMAL LOW (ref 12.0–15.0)
LYMPHS ABS: 2.9 10*3/uL (ref 0.7–4.0)
LYMPHS PCT: 13 %
MCH: 30.4 pg (ref 26.0–34.0)
MCHC: 33.7 g/dL (ref 30.0–36.0)
MCV: 90 fL (ref 78.0–100.0)
MONO ABS: 0.6 10*3/uL (ref 0.1–1.0)
MONOS PCT: 3 %
Neutro Abs: 19.1 10*3/uL — ABNORMAL HIGH (ref 1.7–7.7)
Neutrophils Relative %: 83 %
Platelets: 556 10*3/uL — ABNORMAL HIGH (ref 150–400)
RBC: 2.7 MIL/uL — ABNORMAL LOW (ref 3.87–5.11)
RDW: 21.2 % — AB (ref 11.5–15.5)
WBC: 23 10*3/uL — ABNORMAL HIGH (ref 4.0–10.5)

## 2016-10-16 LAB — RETICULOCYTES
RBC.: 2.7 MIL/uL — ABNORMAL LOW (ref 3.87–5.11)
Retic Ct Pct: >23 % — ABNORMAL HIGH (ref 0.4–3.1)

## 2016-10-16 LAB — BASIC METABOLIC PANEL
ANION GAP: 8 (ref 5–15)
BUN: 10 mg/dL (ref 6–20)
CHLORIDE: 106 mmol/L (ref 101–111)
CO2: 24 mmol/L (ref 22–32)
Calcium: 9 mg/dL (ref 8.9–10.3)
Creatinine, Ser: 0.61 mg/dL (ref 0.44–1.00)
GFR calc Af Amer: >60 mL/min (ref >60)
GLUCOSE: 116 mg/dL — AB (ref 65–99)
POTASSIUM: 3.9 mmol/L (ref 3.5–5.1)
Sodium: 138 mmol/L (ref 135–145)

## 2016-10-16 MED ORDER — HYDROMORPHONE HCL 1 MG/ML IJ SOLN: 2.0000 mg | INTRAMUSCULAR | Status: AC

## 2016-10-16 MED ORDER — DIPHENHYDRAMINE HCL 50 MG/ML IJ SOLN
25.0000 mg | Freq: Once | INTRAMUSCULAR | Status: AC
  Administered 2016-10-16: 25 mg via INTRAVENOUS
  Filled 2016-10-16: qty 1

## 2016-10-16 MED ORDER — HYDROMORPHONE HCL 1 MG/ML IJ SOLN
2.0000 mg | INTRAMUSCULAR | Status: AC
  Administered 2016-10-16: 2 mg via INTRAVENOUS
  Filled 2016-10-16: qty 2

## 2016-10-16 MED ORDER — HEPARIN SOD (PORK) LOCK FLUSH 100 UNIT/ML IV SOLN
500.0000 [IU] | Freq: Once | INTRAVENOUS | Status: AC
  Administered 2016-10-16: 500 [IU]
  Filled 2016-10-16: qty 5

## 2016-10-16 MED ORDER — KETOROLAC TROMETHAMINE 30 MG/ML IJ SOLN
30.0000 mg | INTRAMUSCULAR | Status: AC
  Administered 2016-10-16: 30 mg via INTRAVENOUS
  Filled 2016-10-16: qty 1

## 2016-10-16 MED ORDER — DEXTROSE-NACL 5-0.45 % IV SOLN
INTRAVENOUS | Status: DC
  Administered 2016-10-16: 10:00:00 via INTRAVENOUS

## 2016-10-16 MED ORDER — HYDROMORPHONE HCL 1 MG/ML IJ SOLN
2.0000 mg | INTRAMUSCULAR | Status: AC
  Administered 2016-10-16 (×2): 2 mg via INTRAVENOUS
  Filled 2016-10-16 (×2): qty 2

## 2016-10-16 MED ORDER — ONDANSETRON HCL 4 MG/2ML IJ SOLN
4.0000 mg | INTRAMUSCULAR | Status: DC | PRN
  Administered 2016-10-16: 4 mg via INTRAVENOUS
  Filled 2016-10-16: qty 2

## 2016-10-16 MED ORDER — HYDROMORPHONE HCL 1 MG/ML IJ SOLN
2.0000 mg | INTRAMUSCULAR | Status: AC
  Administered 2016-10-16: 2 mg via INTRAVENOUS

## 2016-10-16 NOTE — ED Provider Notes (Addendum)
[facility] DEPT Provider Note   CSN: [id number] Arrival date & time: 10/16/16  0932     History   Chief Complaint Chief Complaint  Patient presents with  . Sickle Cell Pain Crisis  . Knee Pain    HPI [name] is a 26 y.o. female.  Patient is a 26 year old female with a history of sickle cell disease who presents with worsening knee pain and leg pain that's consistent with her sickle cell pain crises. It started about 2:00 AM this morning. She's taken her normal pain medications at home without improvement in symptoms. She denies any fevers. No chest pain or shortness of breath. No other atypical symptoms.      Past Medical History:  Diagnosis Date  . Blood transfusion    "lots"  . Blood transfusion without reported diagnosis   . Chronic back pain    "very severe; have knot in my back; from tight muscle; take RX and exercise for it"  . Depression 01/06/2011  . Exertional dyspnea    "sometimes"  . Genital HSV   . GERD (gastroesophageal reflux disease) 02/17/2011  . Migraines 11/08/11   "@ least twice/month"  . 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  . Mood swings ([place]) 11/08/11   "I go back and forth; real bad"  . Sickle cell anemia ([name])   . Sickle cell anemia with crisis ([place])   . Trichotillomania    h/o    Patient Active Problem List   Diagnosis Date Noted  . Sickle cell anemia with crisis ([place]) 10/02/2016  . Hb-SS disease with crisis ([place]) 08/20/2016  . Chronic anticoagulation   . Sickle cell anemia ([place]) 05/19/2016  . History of DVT (deep vein thrombosis) 04/17/2016  . Thrombocytosis ([place]) 11/09/2015  . Hyperbilirubinemia 11/09/2015  . Chronic pain 08/04/2015  . Herpes simplex 07/14/2015  . Anemia of chronic disease   . Sickle cell crisis ([place]) 07/31/2013  . Hb-SS disease without crisis ([place]) 02/26/2013  . 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 Sickle cell disease ([place]) 01/08/2009  . 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 CHOLECYSTECTOMY  05/2010  . DILATION AND CURETTAGE OF UTERUS  02/20/11   S/P miscarriage  . IR GENERIC HISTORICAL  12/23/2015   IR FLUORO GUIDE CV LINE RIGHT 12/23/2015 Jacqulynn Cadet, MD [facility]  . IR GENERIC HISTORICAL  12/23/2015   IR US GUIDE VASC ACCESS RIGHT 12/23/2015 Jacqulynn Cadet, MD [facility]    OB History    Gravida Para Term Preterm AB Living   5 2 2  0 3 2   SAB TAB Ectopic Multiple Live Births   3 0 0 0 2      Obstetric Comments   Miscarried in October 2012 at about 7 weeks       Home Medications    Prior to Admission medications   Medication Sig Start Date End Date Taking? Authorizing Provider  hydroxyurea (HYDREA) 500 MG capsule Take 2 capsules (1,000 mg total) by mouth daily. May take with food to minimize GI side effects. Patient taking differently: Take 1,500 mg by mouth daily. May take with food to minimize GI side effects. 04/18/16  Yes Jegede, Marlena Clipper, MD  L-glutamine (ENDARI) 5 g PACK Powder Packet Take 15 g by mouth 2 (two) times daily.   Yes [provider]  medroxyPROGESTERone (DEPO-PROVERA) 150 MG/ML injection Inject 1 mL (150 mg total) into the muscle every 3 (three) months. 09/21/16  Yes Shelly Bombard, MD  morphine (MS CONTIN) 60 MG 12 hr tablet Take 60 mg by mouth every 12 (twelve) hours as needed for pain.    Yes [provider]  oxyCODONE (ROXICODONE) 15 MG immediate release tablet Take 15 mg by mouth every 4 (four) hours as needed for pain.    Yes [provider]  rivaroxaban (XARELTO) 20 MG TABS tablet Take 20 mg by mouth daily with supper.    Yes [provider]  valACYclovir (VALTREX) 1000 MG tablet Take 1 tablet (1,000 mg total) by mouth daily. 05/02/16  Yes [name], MD    Family History Family History  Problem Relation Age of Onset  . Sickle cell trait Mother   . Sickle cell trait Father   . Diabetes Maternal Grandmother   .  Diabetes Paternal Grandmother   . Hypertension Paternal Grandmother   . Diabetes Maternal Grandfather     Social History Social History  Substance Use Topics  . Smoking status: Former Smoker    Packs/day: 0.25    Years: 1.00    Types: Cigarettes    Quit date: 03/25/2013  . Smokeless tobacco: Never Used  . Alcohol use Yes     Comment: Twice a month     Allergies   Food and Latex   Review of Systems Review of Systems  Constitutional: Negative for chills, diaphoresis, fatigue and fever.  HENT: Negative for congestion, rhinorrhea and sneezing.   Eyes: Negative.   Respiratory: Negative for cough, chest tightness and shortness of breath.   Cardiovascular: Negative for chest pain and leg swelling.  Gastrointestinal: Negative for abdominal pain, blood in stool, diarrhea, nausea and vomiting.  Genitourinary: Negative for difficulty urinating, flank pain, frequency and hematuria.  Musculoskeletal: Positive for arthralgias. Negative for back pain.  Skin: Negative for rash.  Neurological: Negative for dizziness, speech difficulty, weakness, numbness and headaches.     Physical Exam Updated Vital Signs BP 112/70   Pulse (!) 110   Temp 98.5 F (36.9 C) (Oral)   Resp 16   LMP 10/04/2016 (Exact Date)   SpO2 92%   Physical Exam  Constitutional: She is oriented to person, place, and time. She appears well-developed and well-nourished.  HENT:  Head: Normocephalic and atraumatic.  Eyes: Pupils are equal, round, and reactive to light.  Neck: Normal range of motion. Neck supple.  Cardiovascular: Normal rate, regular rhythm and normal heart sounds.   Pulmonary/Chest: Effort normal and breath sounds normal. No respiratory distress. She has no wheezes. She has no rales. She exhibits no tenderness.  Abdominal: Soft. Bowel sounds are normal. There is no tenderness. There is no rebound and no guarding.  Musculoskeletal: Normal range of motion. She exhibits no edema.  No swelling or  erythema of the joints  Lymphadenopathy:    She has no cervical adenopathy.  Neurological: She is alert and oriented to person, place, and time.  Skin: Skin is warm and dry. No rash noted.  Psychiatric: She has a normal mood and affect.     ED Treatments / Results  Labs (all labs ordered are listed, but only abnormal results are displayed) Labs Reviewed  RETICULOCYTES - Abnormal; Notable for the following:       Result Value   Retic Ct Pct >23.0 (*)    RBC. 2.70 (*)    All other components within normal limits  BASIC METABOLIC PANEL - Abnormal; Notable for the following:    Glucose, Bld 116 (*)  All other components within normal limits  CBC WITH DIFFERENTIAL/PLATELET - Abnormal; Notable for the following:    WBC 23.0 (*)    RBC 2.70 (*)    Hemoglobin 8.2 (*)    HCT 24.3 (*)    RDW 21.2 (*)    Platelets 556 (*)    Neutro Abs 19.1 (*)    All other components within normal limits    EKG  EKG Interpretation None       Radiology No results found.  Procedures Procedures (including critical care time)  Medications Ordered in ED Medications  dextrose 5 %-0.45 % sodium chloride infusion ( Intravenous New Bag/Given 10/16/16 1025)  ondansetron (ZOFRAN) injection 4 mg (4 mg Intravenous Given 10/16/16 1024)  ketorolac (TORADOL) 30 MG/ML injection 30 mg (30 mg Intravenous Given 10/16/16 1024)  HYDROmorphone (DILAUDID) injection 2 mg (2 mg Intravenous Given 10/16/16 1052)    Or  HYDROmorphone (DILAUDID) injection 2 mg ( Subcutaneous See Alternative 10/16/16 1052)  HYDROmorphone (DILAUDID) injection 2 mg (2 mg Intravenous Given 10/16/16 1025)    Or  HYDROmorphone (DILAUDID) injection 2 mg ( Subcutaneous See Alternative 10/16/16 1025)  HYDROmorphone (DILAUDID) injection 2 mg (2 mg Intravenous Given 10/16/16 1052)    Or  HYDROmorphone (DILAUDID) injection 2 mg ( Subcutaneous See Alternative 10/16/16 1052)  HYDROmorphone (DILAUDID) injection 2 mg (2 mg Intravenous Given 10/16/16 1245)    Or    HYDROmorphone (DILAUDID) injection 2 mg ( Subcutaneous See Alternative 10/16/16 1245)  diphenhydrAMINE (BENADRYL) injection 25 mg (25 mg Intravenous Given 10/16/16 1024)  diphenhydrAMINE (BENADRYL) injection 25 mg (25 mg Intravenous Given 10/16/16 1155)     Initial Impression / Assessment and Plan / ED Course  I have reviewed the triage vital signs and the nursing notes.  Pertinent labs & imaging results that were available during my care of the patient were reviewed by me and considered in my medical decision making (see chart for details).     Patient presents with knee and leg pain consistent with her prior episodes of sickle cell pain crises. She was given the protocol pain medications in the ED and is feeling better. She does not want to be admitted. She still has a little bit of intermittent tachycardia. She denies any shortness of breath. No chest pain. No suggestions of acute chest syndrome. No hypoxia or other symptoms that would be more suggestive of pulmonary embolus. She has an elevated white count which she's had previously although today it's a little bit higher than her baseline. She doesn't have any obvious source of infection. Her joints don't appear to be erythematous or swollen. There is no symptoms that be more consistent with pneumonia. She does not have any fevers. She is not want to be admitted. I did encourage her to stay longer to make sure that the tachycardia resolves she is ready to go. She was discharged home in good condition. She was encouraged to have follow-up with the sickle cell clinic. Return precautions were given.  Final Clinical Impressions(s) / ED Diagnoses   Final diagnoses:  Sickle cell pain crisis [facility])    New Prescriptions New Prescriptions   No medications on file     Malvin Johns, MD 10/16/16 1357    Malvin Johns, MD 10/16/16 1358

## 2016-10-16 NOTE — ED Notes (Signed)
Patient given lunch tray.

## 2016-10-16 NOTE — ED Triage Notes (Signed)
Pt with sickle cell.  States knee pain bilateral starting this morning.

## 2016-10-17 ENCOUNTER — Emergency Department (HOSPITAL_COMMUNITY)
Admission: EM | Admit: 2016-10-17 | Discharge: 2016-10-17 | Disposition: A | Discharge status: Home / Self Care | Payer: Medicare Other | Attending: Emergency Medicine | Admitting: Emergency Medicine

## 2016-10-17 ENCOUNTER — Encounter (HOSPITAL_COMMUNITY): Payer: Self-pay | Admitting: Emergency Medicine

## 2016-10-17 DIAGNOSIS — Z7902 Long term (current) use of antithrombotics/antiplatelets: Secondary | ICD-10-CM | POA: Diagnosis not present

## 2016-10-17 DIAGNOSIS — Z87891 Personal history of nicotine dependence: Secondary | ICD-10-CM | POA: Insufficient documentation

## 2016-10-17 DIAGNOSIS — Z79899 Other long term (current) drug therapy: Secondary | ICD-10-CM | POA: Diagnosis not present

## 2016-10-17 DIAGNOSIS — Z91040 Latex allergy status: Secondary | ICD-10-CM | POA: Diagnosis not present

## 2016-10-17 DIAGNOSIS — D5700 Hb-SS disease with crisis, unspecified: Secondary | ICD-10-CM | POA: Diagnosis not present

## 2016-10-17 LAB — CBC WITH DIFFERENTIAL/PLATELET
BASOS PCT: 0 %
Basophils Absolute: 0 10*3/uL (ref 0.0–0.1)
EOS PCT: 1 %
Eosinophils Absolute: 0.2 10*3/uL (ref 0.0–0.7)
HEMATOCRIT: 24.4 % — AB (ref 36.0–46.0)
Hemoglobin: 8.2 g/dL — ABNORMAL LOW (ref 12.0–15.0)
Lymphocytes Relative: 15 %
Lymphs Abs: 2.9 10*3/uL (ref 0.7–4.0)
MCH: 30.5 pg (ref 26.0–34.0)
MCHC: 33.6 g/dL (ref 30.0–36.0)
MCV: 90.7 fL (ref 78.0–100.0)
MONO ABS: 0.6 10*3/uL (ref 0.1–1.0)
MONOS PCT: 3 %
Neutro Abs: 15.8 10*3/uL — ABNORMAL HIGH (ref 1.7–7.7)
Neutrophils Relative %: 81 %
Platelets: 582 10*3/uL — ABNORMAL HIGH (ref 150–400)
RBC: 2.69 MIL/uL — AB (ref 3.87–5.11)
RDW: 21.2 % — ABNORMAL HIGH (ref 11.5–15.5)
WBC: 19.5 10*3/uL — AB (ref 4.0–10.5)

## 2016-10-17 LAB — COMPREHENSIVE METABOLIC PANEL
ALBUMIN: 4.3 g/dL (ref 3.5–5.0)
ALK PHOS: 60 U/L (ref 38–126)
ALT: 20 U/L (ref 14–54)
AST: 24 U/L (ref 15–41)
Anion gap: 8 (ref 5–15)
BUN: 7 mg/dL (ref 6–20)
CALCIUM: 9.1 mg/dL (ref 8.9–10.3)
CHLORIDE: 104 mmol/L (ref 101–111)
CO2: 26 mmol/L (ref 22–32)
CREATININE: 0.65 mg/dL (ref 0.44–1.00)
GFR calc non Af Amer: >60 mL/min (ref >60)
GLUCOSE: 110 mg/dL — AB (ref 65–99)
Potassium: 4 mmol/L (ref 3.5–5.1)
SODIUM: 138 mmol/L (ref 135–145)
Total Bilirubin: 2.7 mg/dL — ABNORMAL HIGH (ref 0.3–1.2)
Total Protein: 7.5 g/dL (ref 6.5–8.1)

## 2016-10-17 LAB — RETICULOCYTES
RBC.: 2.69 MIL/uL — ABNORMAL LOW (ref 3.87–5.11)
RETIC COUNT ABSOLUTE: 521.9 10*3/uL — AB (ref 19.0–186.0)
Retic Ct Pct: 19.4 % — ABNORMAL HIGH (ref 0.4–3.1)

## 2016-10-17 MED ORDER — HEPARIN SOD (PORK) LOCK FLUSH 100 UNIT/ML IV SOLN
500.0000 [IU] | Freq: Once | INTRAVENOUS | Status: DC
  Filled 2016-10-17: qty 5

## 2016-10-17 MED ORDER — ONDANSETRON HCL 4 MG/2ML IJ SOLN
4.0000 mg | INTRAMUSCULAR | Status: DC | PRN
  Administered 2016-10-17: 4 mg via INTRAVENOUS
  Filled 2016-10-17: qty 2

## 2016-10-17 MED ORDER — DIPHENHYDRAMINE HCL 50 MG/ML IJ SOLN
25.0000 mg | Freq: Once | INTRAMUSCULAR | Status: AC
  Administered 2016-10-17: 25 mg via INTRAVENOUS
  Filled 2016-10-17: qty 1

## 2016-10-17 MED ORDER — KETOROLAC TROMETHAMINE 30 MG/ML IJ SOLN
30.0000 mg | INTRAMUSCULAR | Status: AC
  Administered 2016-10-17: 30 mg via INTRAVENOUS
  Filled 2016-10-17: qty 1

## 2016-10-17 MED ORDER — HYDROMORPHONE HCL 1 MG/ML IJ SOLN: 2.0000 mg | Freq: Once | INTRAMUSCULAR | Status: DC

## 2016-10-17 MED ORDER — HYDROMORPHONE HCL 1 MG/ML IJ SOLN
2.0000 mg | INTRAMUSCULAR | Status: AC
  Administered 2016-10-17: 2 mg via INTRAVENOUS
  Filled 2016-10-17: qty 2

## 2016-10-17 MED ORDER — HYDROMORPHONE HCL 1 MG/ML IJ SOLN
2.0000 mg | INTRAMUSCULAR | Status: DC | PRN
  Administered 2016-10-17 (×3): 2 mg via INTRAVENOUS
  Filled 2016-10-17 (×3): qty 2

## 2016-10-17 MED ORDER — HYDROMORPHONE HCL 1 MG/ML IJ SOLN: 2.0000 mg | INTRAMUSCULAR | Status: AC

## 2016-10-17 NOTE — ED Provider Notes (Signed)
[facility] DEPT Provider Note   CSN: [id number] Arrival date & time: 10/17/16  0522     History   Chief Complaint Chief Complaint  Patient presents with  . Sickle Cell Pain Crisis    HPI [name] is a 26 y.o. female.  Patient presents with persistent pain and bilateral lower extremities, secondary to sickle cell pain crisis. Patient was seen yesterday in ED where her pain was managed, patient requested to return home. Patient states she attempted to manage her pain at home with hydrocodone and morphine, however she is still in pain in his head return to the ED today. Reports her last dose of hydrocodone and morphine was at midnight. She states this pain is typical of her sickle cell pain crisis, stating she only feels pain in her joints. Patient denies chest pain, chest tightness, shortness of breath, abdominal pain.      Past Medical History:  Diagnosis Date  . Blood transfusion    "lots"  . Blood transfusion without reported diagnosis   . Chronic back pain    "very severe; have knot in my back; from tight muscle; take RX and exercise for it"  . Depression 01/06/2011  . Exertional dyspnea    "sometimes"  . Genital HSV   . GERD (gastroesophageal reflux disease) 02/17/2011  . Migraines 11/08/11   "@ least twice/month"  . 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  . Mood swings ([place]) 11/08/11   "I go back and forth; real bad"  . Sickle cell anemia ([name])   . Sickle cell anemia with crisis ([place])   . Trichotillomania    h/o    Patient Active Problem List   Diagnosis Date Noted  . Sickle cell anemia with crisis ([place]) 10/02/2016  . 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 Chronic anticoagulation   . Sickle cell anemia ([place]) 05/19/2016  . History of DVT (deep vein thrombosis) 04/17/2016  . Thrombocytosis ([place]) 11/09/2015  . Hyperbilirubinemia 11/09/2015  . Chronic pain 08/04/2015  . Herpes simplex 07/14/2015  . Anemia of chronic disease   .  Sickle cell crisis ([place]) 07/31/2013  . 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 Sickle cell disease ([place]) 01/08/2009  . 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 CHOLECYSTECTOMY  05/2010  . DILATION AND CURETTAGE OF UTERUS  02/20/11   S/P miscarriage  . IR GENERIC HISTORICAL  12/23/2015   IR FLUORO GUIDE CV LINE RIGHT 12/23/2015 Jacqulynn Cadet, MD [facility]  . IR GENERIC HISTORICAL  12/23/2015   IR US GUIDE VASC ACCESS RIGHT 12/23/2015 Jacqulynn Cadet, MD [facility]    OB History    Gravida Para Term Preterm AB Living   5 2 2  0 3 2   SAB TAB Ectopic Multiple Live Births   3 0 0 0 2      Obstetric Comments   Miscarried in October 2012 at about 7 weeks       Home Medications    Prior to Admission medications   Medication Sig Start Date End Date Taking? Authorizing Provider  hydroxyurea (HYDREA) 500 MG capsule Take 2 capsules (1,000 mg total) by mouth daily. May take with food to minimize GI side effects. Patient taking differently: Take 1,500 mg by mouth daily. May take with food to minimize GI side effects. 04/18/16   Tresa Garter, MD  L-glutamine (ENDARI) 5 g PACK Powder Packet Take 15  g by mouth 2 (two) times daily.    [provider]  medroxyPROGESTERone (DEPO-PROVERA) 150 MG/ML injection Inject 1 mL (150 mg total) into the muscle every 3 (three) months. 09/21/16   Shelly Bombard, MD  morphine (MS CONTIN) 60 MG 12 hr tablet Take 60 mg by mouth every 12 (twelve) hours as needed for pain.     [provider]  oxyCODONE (ROXICODONE) 15 MG immediate release tablet Take 15 mg by mouth every 4 (four) hours as needed for pain.     [provider]  rivaroxaban (XARELTO) 20 MG TABS tablet Take 20 mg by mouth daily with supper.     [provider]  valACYclovir (VALTREX) 1000 MG tablet Take 1 tablet (1,000 mg total) by mouth daily. 05/02/16   [name], MD    Family History Family History  Problem Relation Age of Onset  . Sickle cell trait Mother   . Sickle cell trait Father   . Diabetes Maternal Grandmother   . Diabetes Paternal Grandmother   . Hypertension Paternal Grandmother   . Diabetes Maternal Grandfather     Social History Social History  Substance Use Topics  . Smoking status: Former Smoker    Packs/day: 0.25    Years: 1.00    Types: Cigarettes    Quit date: 03/25/2013  . Smokeless tobacco: Never Used  . Alcohol use Yes     Comment: Twice a month     Allergies   Food and Latex   Review of Systems Review of Systems  Constitutional: Negative for fever.  HENT: Negative for trouble swallowing.   Eyes: Negative for visual disturbance.  Respiratory: Negative for chest tightness and shortness of breath.   Cardiovascular: Negative for chest pain and leg swelling.  Gastrointestinal: Negative for abdominal pain, nausea and vomiting.  Genitourinary: Negative for dysuria.  Musculoskeletal: Positive for arthralgias and myalgias. Negative for joint swelling.  Skin: Negative for color change.  Neurological: Negative for light-headedness.     Physical Exam Updated Vital Signs BP (!) 104/56 (BP Location: Left Arm)   Pulse 90   Temp 99 F (37.2 C) (Oral)   Resp 16   Ht 5\' 1"  (1.549 m)   Wt 90.7 kg (200 lb)   LMP 10/04/2016 (Exact Date)   SpO2 93%   BMI 37.79 kg/m   Physical Exam  Constitutional: She appears well-developed and well-nourished.  HENT:  Head: Normocephalic and atraumatic.  Eyes: Conjunctivae are normal.  Neck: Normal range of motion. Neck supple.  Cardiovascular: Normal rate, regular rhythm, normal heart sounds and intact distal pulses.  Exam reveals no friction rub.   No murmur heard. Pulmonary/Chest: Effort normal and breath sounds normal. No respiratory distress. She has no wheezes. She has no rales. She exhibits no tenderness.  Abdominal: Soft. Bowel sounds are normal. She  exhibits no distension. There is no tenderness. There is no rebound and no guarding.  Musculoskeletal:  Patient with diffuse tenderness to bilateral lower extremities. No erythema, edema, or inc warmth. Intact distal pulses  Neurological: She is alert.  Skin: Skin is warm.  Psychiatric: She has a normal mood and affect. Her behavior is normal.  Nursing note and vitals reviewed.    ED Treatments / Results  Labs (all labs ordered are listed, but only abnormal results are displayed) Labs Reviewed  CBC WITH DIFFERENTIAL/PLATELET - Abnormal; Notable for the following:       Result Value   WBC 19.5 (*)    RBC  2.69 (*)    Hemoglobin 8.2 (*)    HCT 24.4 (*)    RDW 21.2 (*)    Platelets 582 (*)    Neutro Abs 15.8 (*)    All other components within normal limits  RETICULOCYTES - Abnormal; Notable for the following:    Retic Ct Pct 19.4 (*)    RBC. 2.69 (*)    Retic Count, Manual 521.9 (*)    All other components within normal limits  COMPREHENSIVE METABOLIC PANEL - Abnormal; Notable for the following:    Glucose, Bld 110 (*)    Total Bilirubin 2.7 (*)    All other components within normal limits    EKG  EKG Interpretation None       Radiology No results found.  Procedures Procedures (including critical care time)  Medications Ordered in ED Medications  ondansetron (ZOFRAN) injection 4 mg (4 mg Intravenous Given 10/17/16 0607)  HYDROmorphone (DILAUDID) injection 2 mg (2 mg Intravenous Given 10/17/16 0847)  heparin lock flush 100 unit/mL (not administered)  HYDROmorphone (DILAUDID) injection 2 mg (2 mg Intravenous Given 10/17/16 0624)    Or  HYDROmorphone (DILAUDID) injection 2 mg ( Subcutaneous See Alternative 10/17/16 0624)  diphenhydrAMINE (BENADRYL) injection 25 mg (25 mg Intravenous Given 10/17/16 0612)  ketorolac (TORADOL) 30 MG/ML injection 30 mg (30 mg Intravenous Given 10/17/16 0615)  diphenhydrAMINE (BENADRYL) injection 25 mg (25 mg Intravenous Given 10/17/16 0803)      Initial Impression / Assessment and Plan / ED Course  I have reviewed the triage vital signs and the nursing notes.  Pertinent labs & imaging results that were available during my care of the patient were reviewed by me and considered in my medical decision making (see chart for details).  Clinical Course as of Oct 18 1022  Mon Oct 17, 2016  0349 Reevaluated the patient after third dose of Dilaudid. Patient states she is feeling well enough to go home today.  [JR]    Clinical Course User Index [JR] Russo, [place] N, PA-C    Patient with sickle cell pain crisis. No chest pain, shortness of breath, patient is afebrile. Patient with bilateral lower extremity pain. Consistent with previous episodes of sickle cell pain crisis, no edema. Labs without significant change from yesterday. Reticulocytes still elevated. Pt's pain improved w Dilaudid. On reevaluation, Pt Stating she is feeling well enough to go home. Patient encouraged to follow up with sickle cell clinic. Patient safe for discharge home.  Patient discussed with Dr. Rolland Porter. Discussed results, findings, treatment and follow up. Patient advised of return precautions. Patient verbalized understanding and agreed with plan.   Final Clinical Impressions(s) / ED Diagnoses   Final diagnoses:  Sickle cell pain crisis [facility])    New Prescriptions Discharge Medication List as of 10/17/2016  9:27 AM       Russo, [place] N, PA-C 10/17/16 1025    Rolland Porter, MD 10/18/16 [phone]

## 2016-10-17 NOTE — ED Notes (Signed)
Per [place] PA, patient requesting one more dose of pain meds then wants to go home.  Gave this RN verbal order to given Dilaudid 2mg  via IV in 15-20 mins, then patient will be put up for discharge to home.

## 2016-10-17 NOTE — ED Triage Notes (Signed)
Pt reports having increasing pain in bilateral knees after starting in Sickle Cell Crisis 2 days ago. Pt reports being seen yesterday and was told to return if symptoms did not improve.

## 2016-10-17 NOTE — ED Provider Notes (Signed)
MSE was initiated and I personally evaluated the patient and placed orders (if any) at  5:41 AM on October 17, 2016.  Patient presents to the emergency department with chief complaint of persistent sickle cell pain. She states that she has pain in bilateral knees. She is having the symptoms yesterday, but wanted to go home. She has tried taking oxycodone and morphine at home with no relief. She denies any fevers chills. She states that she normally gets the pain in her knees and lower extremities. She denies any chest pain or shortness breath. Denies any other associated symptoms. She states that she is hopeful that she can go home today.  Labs and sickle cell order set entered.    1st dose of dilaudid ordered.  Subsequent doses to be ordered by oncoming team.  The patient appears stable so that the remainder of the MSE may be completed by another provider.   Montine Circle, PA-C 10/17/16 [place]    Rolland Porter, MD 10/17/16 [phone]

## 2016-10-17 NOTE — ED Notes (Signed)
ED Provider at bedside. 

## 2016-10-17 NOTE — Discharge Instructions (Signed)
These instructions below.  Continue drinking plenty of water. Schedule an appointment to follow-up with the sickle cell clinic. Return to the ER for chest pain, shortness of breath, fever, or new or worsening symptoms.

## 2016-10-22 IMAGING — CR DG CHEST 2V
2 series · 2 of 2 positions shown · non-contrast
Comparison: Chest x-ray 11/08/2015.

CLINICAL DATA: Sickle cell crisis.  Fever for 2 days.

EXAM:
CHEST  2 VIEW

[w chest pa]
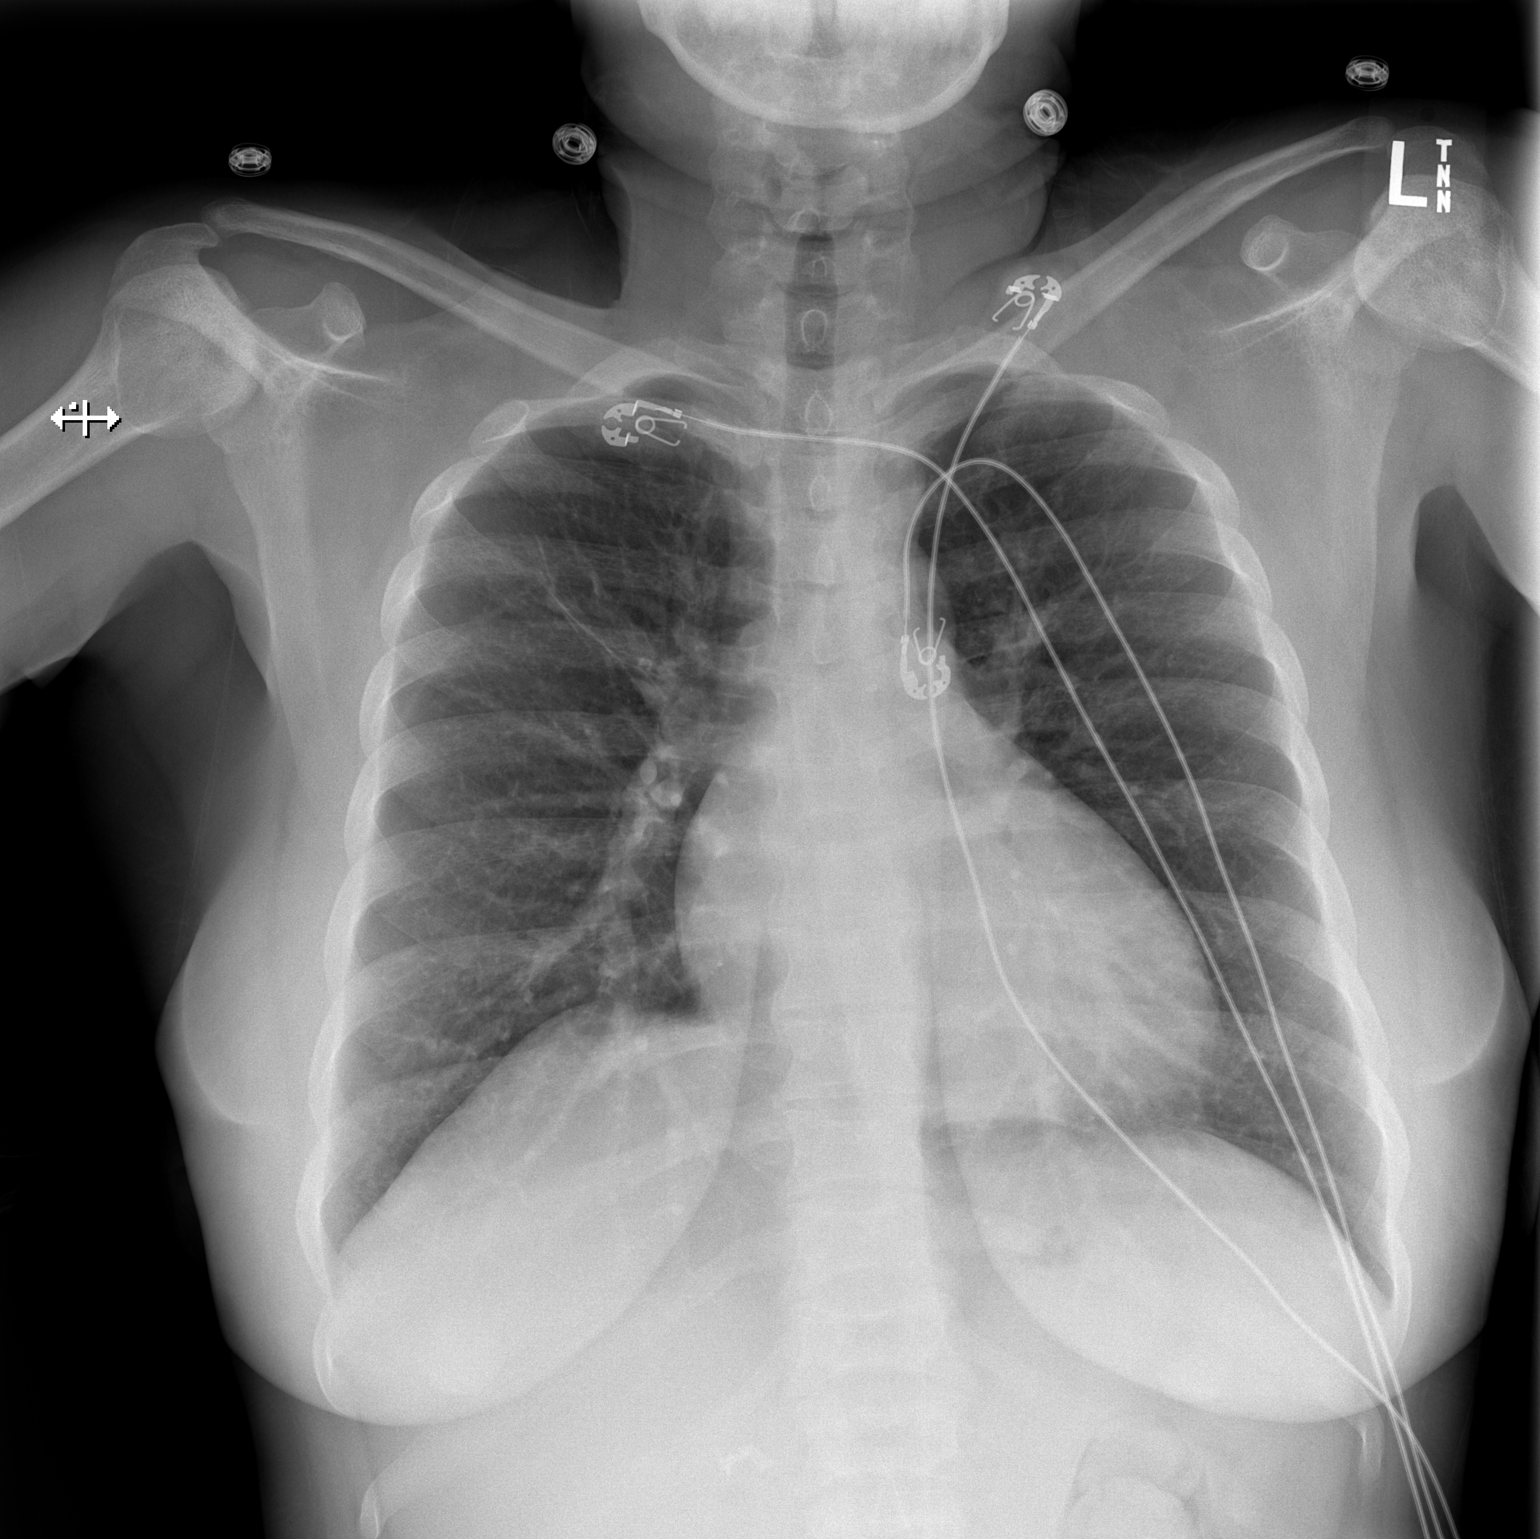

[w chest lat]
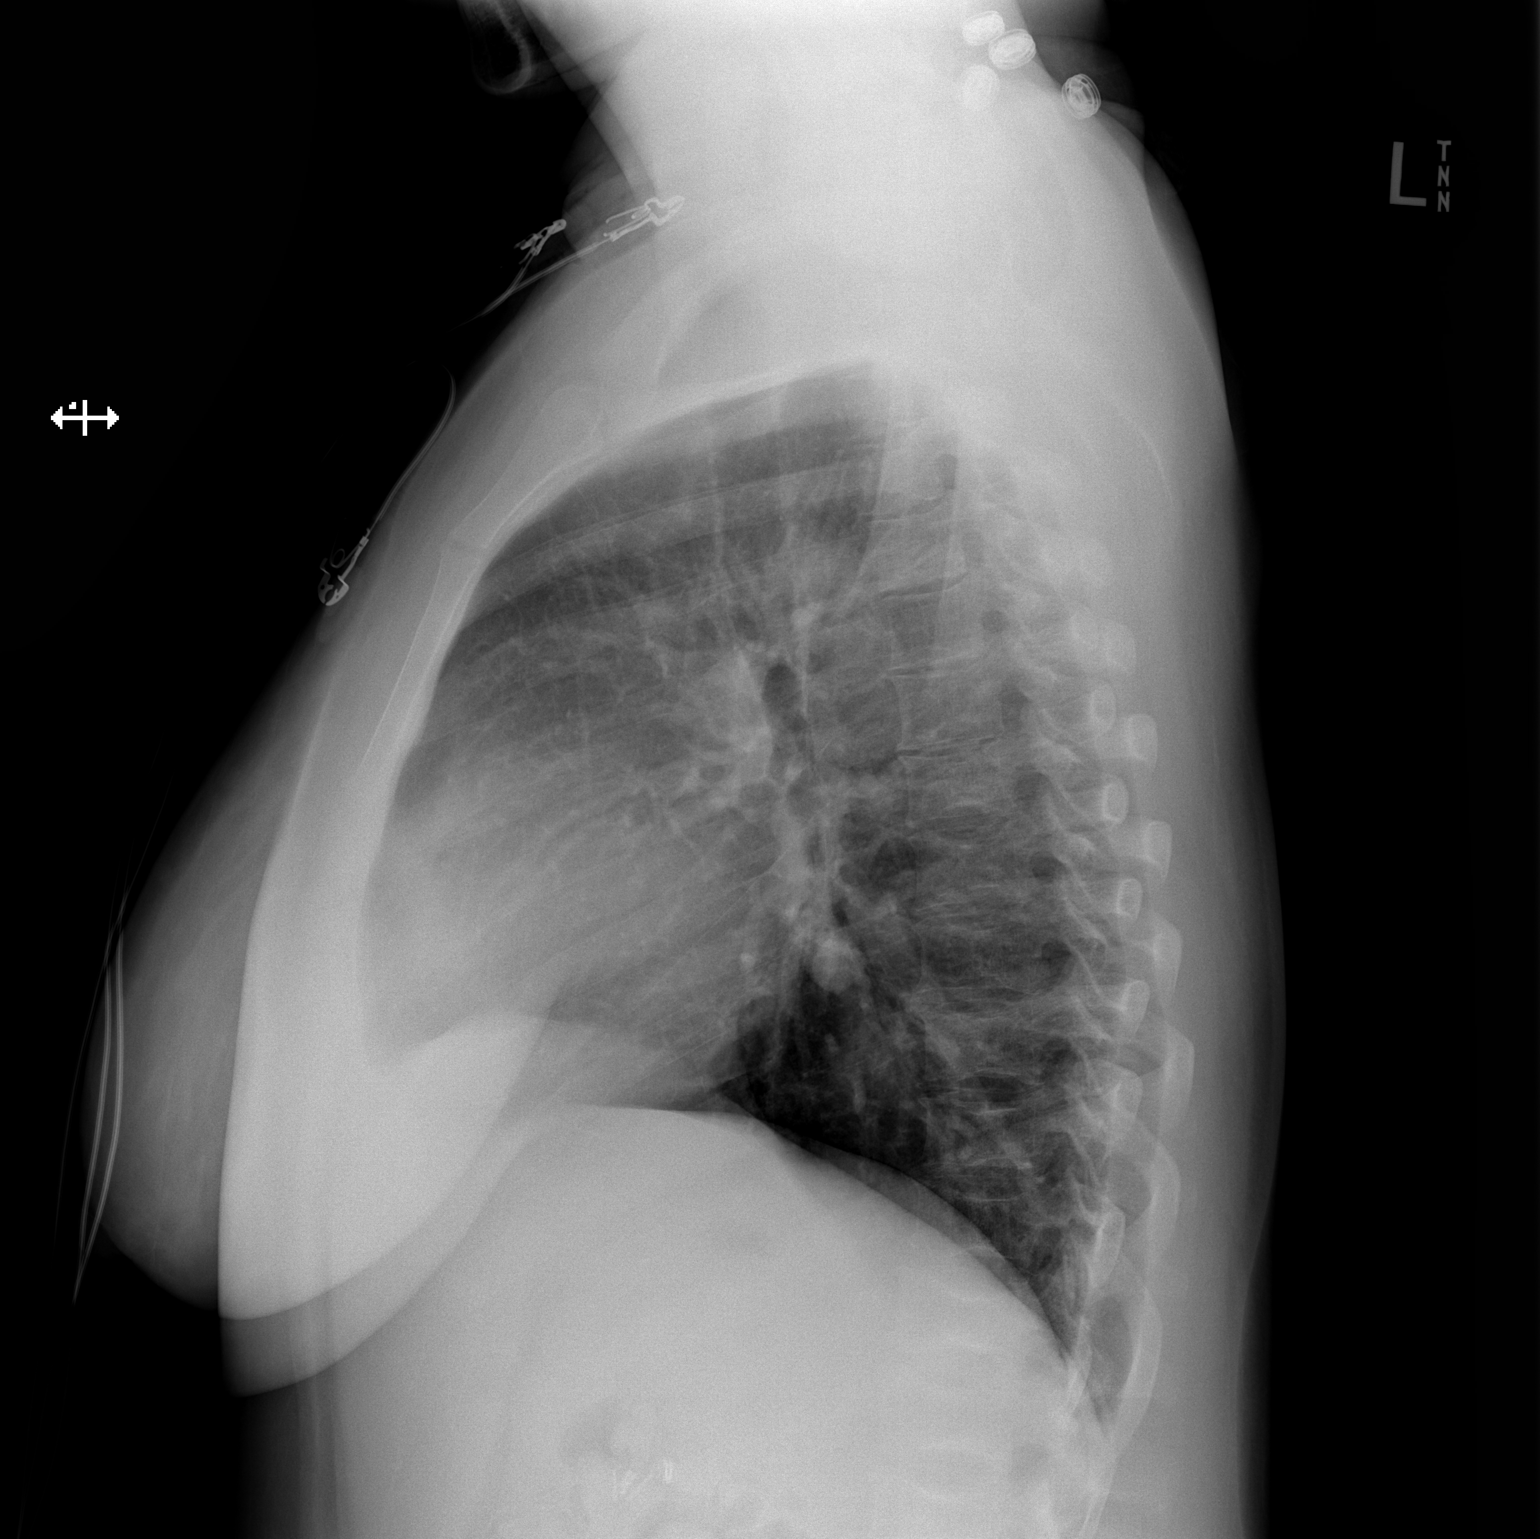

[2 of 2 positions shown; findings below may reference images not displayed]

FINDINGS: The heart size and mediastinal contours are within normal limits.
Both lungs are clear. Sclerosis in the humeral heads is compatible
with avascular necrosis/infarct.
IMPRESSION: No active cardiopulmonary disease.

## 2016-10-31 ENCOUNTER — Encounter (HOSPITAL_COMMUNITY): Payer: Self-pay

## 2016-10-31 ENCOUNTER — Encounter (HOSPITAL_COMMUNITY): Payer: Self-pay | Admitting: *Deleted

## 2016-10-31 ENCOUNTER — Non-Acute Institutional Stay (HOSPITAL_COMMUNITY)
Admission: AD | Admit: 2016-10-31 | Discharge: 2016-10-31 | Disposition: A | Discharge status: Home / Self Care | Payer: Medicare Other | Source: Ambulatory Visit | Attending: Internal Medicine | Admitting: Internal Medicine

## 2016-10-31 ENCOUNTER — Emergency Department (HOSPITAL_COMMUNITY)
Admission: EM | Admit: 2016-10-31 | Discharge: 2016-10-31 | Disposition: A | Discharge status: Home / Self Care | Payer: Medicare Other | Source: Home / Self Care | Attending: Emergency Medicine | Admitting: Emergency Medicine

## 2016-10-31 DIAGNOSIS — K219 Gastro-esophageal reflux disease without esophagitis: Secondary | ICD-10-CM | POA: Insufficient documentation

## 2016-10-31 DIAGNOSIS — D5700 Hb-SS disease with crisis, unspecified: Secondary | ICD-10-CM

## 2016-10-31 DIAGNOSIS — G8929 Other chronic pain: Secondary | ICD-10-CM | POA: Diagnosis not present

## 2016-10-31 DIAGNOSIS — Z79899 Other long term (current) drug therapy: Secondary | ICD-10-CM | POA: Insufficient documentation

## 2016-10-31 DIAGNOSIS — Z87891 Personal history of nicotine dependence: Secondary | ICD-10-CM | POA: Insufficient documentation

## 2016-10-31 DIAGNOSIS — F329 Major depressive disorder, single episode, unspecified: Secondary | ICD-10-CM | POA: Insufficient documentation

## 2016-10-31 DIAGNOSIS — Z7901 Long term (current) use of anticoagulants: Secondary | ICD-10-CM | POA: Insufficient documentation

## 2016-10-31 DIAGNOSIS — Z91040 Latex allergy status: Secondary | ICD-10-CM | POA: Insufficient documentation

## 2016-10-31 DIAGNOSIS — Z7902 Long term (current) use of antithrombotics/antiplatelets: Secondary | ICD-10-CM | POA: Insufficient documentation

## 2016-10-31 LAB — CBC WITH DIFFERENTIAL/PLATELET
BASOS ABS: 0.1 10*3/uL (ref 0.0–0.1)
Basophils Relative: 1 %
Eosinophils Absolute: 0.4 10*3/uL (ref 0.0–0.7)
Eosinophils Relative: 4 %
HEMATOCRIT: 25.3 % — AB (ref 36.0–46.0)
HEMOGLOBIN: 8.4 g/dL — AB (ref 12.0–15.0)
LYMPHS PCT: 20 %
Lymphs Abs: 2.2 10*3/uL (ref 0.7–4.0)
MCH: 27.7 pg (ref 26.0–34.0)
MCHC: 33.2 g/dL (ref 30.0–36.0)
MCV: 83.5 fL (ref 78.0–100.0)
MONOS PCT: 10 %
Monocytes Absolute: 1.1 10*3/uL — ABNORMAL HIGH (ref 0.1–1.0)
NEUTROS ABS: 7.1 10*3/uL (ref 1.7–7.7)
Neutrophils Relative %: 65 %
Platelets: 561 10*3/uL — ABNORMAL HIGH (ref 150–400)
RBC: 3.03 MIL/uL — AB (ref 3.87–5.11)
RDW: 20.8 % — AB (ref 11.5–15.5)
WBC: 10.9 10*3/uL — AB (ref 4.0–10.5)

## 2016-10-31 LAB — URINALYSIS, COMPLETE (UACMP) WITH MICROSCOPIC
Bilirubin Urine: NEGATIVE
GLUCOSE, UA: NEGATIVE mg/dL
Hgb urine dipstick: NEGATIVE
Ketones, ur: NEGATIVE mg/dL
Nitrite: NEGATIVE
PH: 7 (ref 5.0–8.0)
Protein, ur: NEGATIVE mg/dL
Specific Gravity, Urine: 1.004 — ABNORMAL LOW (ref 1.005–1.030)

## 2016-10-31 LAB — COMPREHENSIVE METABOLIC PANEL
ALK PHOS: 68 U/L (ref 38–126)
ALT: 14 U/L (ref 14–54)
ANION GAP: 5 (ref 5–15)
AST: 18 U/L (ref 15–41)
Albumin: 4.2 g/dL (ref 3.5–5.0)
BILIRUBIN TOTAL: 2.8 mg/dL — AB (ref 0.3–1.2)
BUN: 9 mg/dL (ref 6–20)
CALCIUM: 9 mg/dL (ref 8.9–10.3)
CO2: 28 mmol/L (ref 22–32)
Chloride: 107 mmol/L (ref 101–111)
Creatinine, Ser: 0.53 mg/dL (ref 0.44–1.00)
Glucose, Bld: 110 mg/dL — ABNORMAL HIGH (ref 65–99)
POTASSIUM: 4 mmol/L (ref 3.5–5.1)
Sodium: 140 mmol/L (ref 135–145)
TOTAL PROTEIN: 7.6 g/dL (ref 6.5–8.1)

## 2016-10-31 LAB — RETICULOCYTES
RBC.: 3.03 MIL/uL — AB (ref 3.87–5.11)
RETIC COUNT ABSOLUTE: 506 10*3/uL — AB (ref 19.0–186.0)
RETIC CT PCT: 16.7 % — AB (ref 0.4–3.1)

## 2016-10-31 MED ORDER — SODIUM CHLORIDE 0.9% FLUSH
10.0000 mL | INTRAVENOUS | Status: DC | PRN
  Administered 2016-10-31: 10 mL
  Filled 2016-10-31: qty 10

## 2016-10-31 MED ORDER — HYDROMORPHONE HCL 1 MG/ML IJ SOLN
2.0000 mg | Freq: Once | INTRAMUSCULAR | Status: AC
  Administered 2016-10-31: 2 mg via INTRAVENOUS
  Filled 2016-10-31: qty 2

## 2016-10-31 MED ORDER — KETOROLAC TROMETHAMINE 30 MG/ML IJ SOLN
30.0000 mg | Freq: Once | INTRAMUSCULAR | Status: AC
  Administered 2016-10-31: 30 mg via INTRAVENOUS
  Filled 2016-10-31: qty 1

## 2016-10-31 MED ORDER — DEXTROSE-NACL 5-0.45 % IV SOLN
INTRAVENOUS | Status: DC
  Administered 2016-10-31: 12:00:00 via INTRAVENOUS

## 2016-10-31 MED ORDER — HEPARIN SOD (PORK) LOCK FLUSH 100 UNIT/ML IV SOLN
500.0000 [IU] | INTRAVENOUS | Status: DC | PRN
  Administered 2016-10-31: 500 [IU]
  Filled 2016-10-31: qty 5

## 2016-10-31 MED ORDER — OXYCODONE HCL 5 MG PO TABS
15.0000 mg | ORAL_TABLET | Freq: Once | ORAL | Status: AC
  Administered 2016-10-31: 15 mg via ORAL
  Filled 2016-10-31: qty 3

## 2016-10-31 NOTE — ED Provider Notes (Signed)
[facility] DEPT Provider Note   CSN: [id number] Arrival date & time: 10/31/16  1010     History   Chief Complaint Chief Complaint  Patient presents with  . Sickle Cell Pain Crisis    HPI [name] is a 26 y.o. female.  26 year old female with history of sickle cell disease presents with her usual sickle cell crisis characterized as left arm pain. No fever or chills. No nausea vomiting. Denies any severe headaches or neurological findings. Has used her home opiates without relief. No cough or congestion. Pain characterized as sharp and worse with movement and nothing makes it better.      Past Medical History:  Diagnosis Date  . Blood transfusion    "lots"  . Blood transfusion without reported diagnosis   . Chronic back pain    "very severe; have knot in my back; from tight muscle; take RX and exercise for it"  . Depression 01/06/2011  . Exertional dyspnea    "sometimes"  . Genital HSV   . GERD (gastroesophageal reflux disease) 02/17/2011  . Migraines 11/08/11   "@ least twice/month"  . 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  . Mood swings ([place]) 11/08/11   "I go back and forth; real bad"  . Sickle cell anemia ([name])   . Sickle cell anemia with crisis ([place])   . Trichotillomania    h/o    Patient Active Problem List   Diagnosis Date Noted  . Sickle cell anemia with crisis ([place]) 10/02/2016  . 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 Chronic anticoagulation   . Sickle cell anemia ([place]) 05/19/2016  . History of DVT (deep vein thrombosis) 04/17/2016  . Thrombocytosis ([place]) 11/09/2015  . Hyperbilirubinemia 11/09/2015  . Chronic pain 08/04/2015  . Herpes simplex 07/14/2015  . Anemia of chronic disease   . Sickle cell crisis ([place]) 07/31/2013  . 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 Sickle cell disease ([place]) 01/08/2009  . 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 CHOLECYSTECTOMY  05/2010  . DILATION AND CURETTAGE OF UTERUS  02/20/11   S/P miscarriage  . IR GENERIC HISTORICAL  12/23/2015   IR FLUORO GUIDE CV LINE RIGHT 12/23/2015 Jac[facility]nn Cadet, MD [facility]  . IR GENERIC HISTORICAL  12/23/2015   IR US GUIDE VASC ACCESS RIGHT 12/23/2015 Jacqulynn Cadet, MD [facility]    OB History    Gravida Para Term Preterm AB Living   5 2 2  0 3 2   SAB TAB Ectopic Multiple Live Births   3 0 0 0 2      Obstetric Comments   Miscarried in October 2012 at about 7 weeks       Home Medications    Prior to Admission medications   Medication Sig Start Date End Date Taking? Authorizing Provider  hydroxyurea (HYDREA) 500 MG capsule Take 2 capsules (1,000 mg total) by mouth daily. May take with food to minimize GI side effects. Patient taking differently: Take 1,500 mg by mouth daily. May take with food to minimize GI side effects. 04/18/16   Tresa Garter, MD  L-glutamine (ENDARI) 5 g PACK Powder Packet Take 15 g by mouth 2 (two) times daily.    [provider]  medroxyPROGESTERone (DEPO-PROVERA) 150 MG/ML injection Inject 1 mL (150 mg total) into the muscle every 3 (three) months. 09/21/16   Shelly Bombard, MD  morphine (MS CONTIN)  60 MG 12 hr tablet Take 60 mg by mouth every 12 (twelve) hours as needed for pain.     [provider]  oxyCODONE (ROXICODONE) 15 MG immediate release tablet Take 15 mg by mouth every 4 (four) hours as needed for pain.     [provider]  rivaroxaban (XARELTO) 20 MG TABS tablet Take 20 mg by mouth daily with supper.     [provider]  valACYclovir (VALTREX) 1000 MG tablet Take 1 tablet (1,000 mg total) by mouth daily. 05/02/16   [name], MD    Family History Family History  Problem Relation Age of Onset  . Sickle cell trait Mother   . Sickle cell trait Father   . Diabetes Maternal Grandmother   . Diabetes Paternal Grandmother   . Hypertension Paternal  Grandmother   . Diabetes Maternal Grandfather     Social History Social History  Substance Use Topics  . Smoking status: Former Smoker    Packs/day: 0.25    Years: 1.00    Types: Cigarettes    Quit date: 03/25/2013  . Smokeless tobacco: Never Used  . Alcohol use Yes     Comment: Twice a month     Allergies   Food and Latex   Review of Systems Review of Systems  All other systems reviewed and are negative.    Physical Exam Updated Vital Signs BP 102/68 (BP Location: Right Arm)   Pulse 83   Temp 98.1 F (36.7 C) (Oral)   Resp 16   LMP 10/04/2016 (Exact Date)   SpO2 99%   Physical Exam  Constitutional: She is oriented to person, place, and time. She appears well-developed and well-nourished.  Non-toxic appearance. No distress.  HENT:  Head: Normocephalic and atraumatic.  Eyes: Conjunctivae, EOM and lids are normal. Pupils are equal, round, and reactive to light.  Neck: Normal range of motion. Neck supple. No tracheal deviation present. No thyroid mass present.  Cardiovascular: Normal rate, regular rhythm and normal heart sounds.  Exam reveals no gallop.   No murmur heard. Pulmonary/Chest: Effort normal and breath sounds normal. No stridor. No respiratory distress. She has no decreased breath sounds. She has no wheezes. She has no rhonchi. She has no rales.  Abdominal: Soft. Normal appearance and bowel sounds are normal. She exhibits no distension. There is no tenderness. There is no rebound and no CVA tenderness.  Musculoskeletal: Normal range of motion. She exhibits no edema or tenderness.       Arms: Neurological: She is alert and oriented to person, place, and time. She has normal strength. No cranial nerve deficit or sensory deficit. GCS eye subscore is 4. GCS verbal subscore is 5. GCS motor subscore is 6.  Skin: Skin is warm and dry. No abrasion and no rash noted.  Psychiatric: She has a normal mood and affect. Her speech is normal and behavior is normal.    Nursing note and vitals reviewed.    ED Treatments / Results  Labs (all labs ordered are listed, but only abnormal results are displayed) Labs Reviewed  COMPREHENSIVE METABOLIC PANEL  CBC WITH DIFFERENTIAL/PLATELET  RETICULOCYTES    EKG  EKG Interpretation None       Radiology No results found.  Procedures Procedures (including critical care time)  Medications Ordered in ED Medications - No data to display   Initial Impression / Assessment and Plan / ED Course  I have reviewed the triage vital signs and the nursing notes.  Pertinent labs &  imaging results that were available during my care of the patient were reviewed by me and considered in my medical decision making (see chart for details).     Patient is afebrile here. Has no concern for acute chest syndrome. No concern for septic joint. Patient states that she feels that she probably treated and go home. Does not feel like she is sick enough to require admission. Spoke with the provider at the sickle cell clinic and they have accepted her for treatment today. Patient has been informed  Final Clinical Impressions(s) / ED Diagnoses   Final diagnoses:  None    New Prescriptions New Prescriptions   No medications on file     Lacretia Leigh, MD 10/31/16 1109

## 2016-10-31 NOTE — Discharge Instructions (Signed)
Go directly to the sickle cell clinic from here

## 2016-10-31 NOTE — Progress Notes (Signed)
Patient received from the ED c/o sickle cell related pain. Upon admission patient's pain level was 8/10 on pain scale. Patient was treated with IV fluids, Dilaudid pushes and IV Toradol. At time of discharge patient's pain score was down to 6/10 on pain scale. Discharge instructions given to patient and patient states an understanding. Patient alert, oriented, and ambulatory at time of discharge.

## 2016-10-31 NOTE — H&P (Signed)
[facility] History and Physical   Date: 10/31/2016  Patient name: [name] Medical record number: [id number] Date of birth: Apr 06, 1991 Age: 26 y.o. Gender: female PCP: Ricke Hey, MD  Attending physician: Tresa Garter, MD  Chief Complaint: Left arm pain  History of Present Illness: Ms. Gissela Bloch, a 26 year old female with a history of sickle cell anemia, HbSS presents complaining of left arm pain that is consistent with typical sickle cell pain. She says that pain started on Saturday evening. She attributes current pain crisis to hot weather at an outdoor birthday party. Pain intensity is 9/10 described as intermittent and aching. She last had MS Contin 60 mg on last night and Oxycodone 15 mg around 4 am without sustained relief. She is taking all medications consistently. She denies headache, fatigue, chest pain, shortness of breath, dysuria, nausea, vomiting, or diarrhea.   Meds: Prescriptions Prior to Admission  Medication Sig Dispense Refill Last Dose  . hydroxyurea (HYDREA) 500 MG capsule Take 2 capsules (1,000 mg total) by mouth daily. May take with food to minimize GI side effects. (Patient taking differently: Take 1,500 mg by mouth daily. May take with food to minimize GI side effects.) 180 capsule 3 10/15/2016 at Unknown time  . L-glutamine (ENDARI) 5 g PACK Powder Packet Take 15 g by mouth 2 (two) times daily.   Past Week at Unknown time  . medroxyPROGESTERone (DEPO-PROVERA) 150 MG/ML injection Inject 1 mL (150 mg total) into the muscle every 3 (three) months. 1 mL 3 Past Week at Unknown time  . morphine (MS CONTIN) 60 MG 12 hr tablet Take 60 mg by mouth every 12 (twelve) hours as needed for pain.    10/15/2016 at Unknown time  . oxyCODONE (ROXICODONE) 15 MG immediate release tablet Take 15 mg by mouth every 4 (four) hours as needed for pain.    10/15/2016 at Unknown time  . rivaroxaban (XARELTO) 20 MG TABS tablet Take 20 mg by mouth daily with  supper.    10/15/2016 at 1000  . valACYclovir (VALTREX) 1000 MG tablet Take 1 tablet (1,000 mg total) by mouth daily. 30 tablet 1 Past Week at Unknown time    Allergies: Food and Latex Past Medical History:  Diagnosis Date  . Blood transfusion    "lots"  . Blood transfusion without reported diagnosis   . Chronic back pain    "very severe; have knot in my back; from tight muscle; take RX and exercise for it"  . Depression 01/06/2011  . Exertional dyspnea    "sometimes"  . Genital HSV   . GERD (gastroesophageal reflux disease) 02/17/2011  . Migraines 11/08/11   "@ least twice/month"  . 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  . Mood swings ([place]) 11/08/11   "I go back and forth; real bad"  . Sickle cell anemia ([name])   . Sickle cell anemia with crisis ([place])   . Trichotillomania    h/o   Past Surgical History:  Procedure Laterality Date  . CHOLECYSTECTOMY  05/2010  . DILATION AND CURETTAGE OF UTERUS  02/20/11   S/P miscarriage  . IR GENERIC HISTORICAL  12/23/2015   IR FLUORO GUIDE CV LINE RIGHT 12/23/2015 Jacqulynn Cadet, MD [facility]  . IR GENERIC HISTORICAL  12/23/2015   IR US GUIDE VASC ACCESS RIGHT 12/23/2015 Jacqulynn Cadet, MD [facility]   Family History  Problem Relation Age of Onset  . Sickle cell trait Mother   . Sickle cell trait  Father   . Diabetes Maternal Grandmother   . Diabetes Paternal Grandmother   . Hypertension Paternal Grandmother   . Diabetes Maternal Grandfather    Social History   Social History  . Marital status: Single    Spouse name: N/A  . Number of children: N/A  . Years of education: N/A   Occupational History  . Not on file.   Social History Main Topics  . Smoking status: Former Smoker    Packs/day: 0.25    Years: 1.00    Types: Cigarettes    Quit date: 03/25/2013  . Smokeless tobacco: Never Used  . Alcohol use Yes     Comment: Twice a month  . Drug use: No     Comment: "Clean for 3 months"  . Sexual activity: Yes     Partners: Male    Birth control/ protection: None     Comment: last sex Jul 11 2015   Other Topics Concern  . 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  Negative for malaise/fatigue.  HENT: Negative.   Eyes: Negative.   Respiratory: Negative.  Negative for shortness of breath.   Cardiovascular: Negative.  Negative for chest pain and leg swelling.  Gastrointestinal: Negative.  Negative for constipation, diarrhea, nausea and vomiting.  Genitourinary: Negative.  Negative for dysuria.  Musculoskeletal: Positive for myalgias (Left arm pain).  Skin: Negative.   Neurological: Negative.  Negative for dizziness.  Endo/Heme/Allergies: Negative.   Psychiatric/Behavioral: Negative.      Physical Exam: Last menstrual period 10/04/2016, not currently breastfeeding. Physical Exam  Constitutional: She is oriented to person, place, and time and well-developed, well-nourished, and in no distress.  HENT:  Head: Normocephalic and atraumatic.  Right Ear: External ear normal.  Left Ear: External ear normal.  Mouth/Throat: Oropharynx is clear and moist.  Eyes: Conjunctivae and EOM are normal. Pupils are equal, round, and reactive to light.  Neck: Normal range of motion. Neck supple.  Cardiovascular: Normal rate, regular rhythm, normal heart sounds and intact distal pulses.   Pulmonary/Chest: Breath sounds normal.  Abdominal: Soft. Bowel sounds are normal.  Musculoskeletal: Normal range of motion. She exhibits tenderness (Left forearm tender to palpation, 3/5 weakness, decreased ROM). She exhibits no edema.  Neurological: She is alert and oriented to person, place, and time. Gait normal.  Skin: Skin is warm and dry.    Lab results: No results found. However, due to the size of the patient record, not all encounters were  searched. Please check Results Review for a complete set of results.  Imaging results:  No results found.   Assessment & Plan:   Patient will be admitted to the day infusion center for extended observation  Start IV D5.45 for cellular rehydration at 125/hr  Start IV Dilaudid per clinician assisted doses.   Patient will be re-evaluated for pain intensity in the context of function and relationship to baseline as care progresses.  If no significant pain relief, will transfer patient to inpatient services for a higher level of care.   Will check CMP, reticulocytes,  and CBC w/differential  Nizar Cutler M 10/31/2016, 11:32 AM

## 2016-10-31 NOTE — ED Triage Notes (Signed)
Pt presents w/ 8/10 left arm pain r/t sickle cells. Pt report taking x1 60mg  Morphine tablet last night and x1 15mg  Oxycodone tablet this AM w/o relief. Pt denies recent fall/injury/trauma.

## 2016-10-31 NOTE — Discharge Summary (Signed)
[facility] Discharge Summary   Patient ID: [name] MRN: [id number] DOB/AGE: 25-29-1992 25 y.o.  Admit date: 10/31/2016 Discharge date: 10/31/2016  Primary Care Physician:  Ricke Hey, MD  Admission Diagnoses:  Active Problems:   Hb-SS disease with crisis [facility])   Discharge Medications:  Allergies as of 10/31/2016      Reactions   Food Hives, Other (See Comments)   Pt is allergic to carrots.     Latex Rash      Medication List    TAKE these medications   hydroxyurea 500 MG capsule Commonly known as:  HYDREA Take 2 capsules (1,000 mg total) by mouth daily. May take with food to minimize GI side effects. What changed:  how much to take  additional instructions   L-glutamine 5 g Pack Powder Packet Commonly known as:  ENDARI Take 15 g by mouth 2 (two) times daily.   medroxyPROGESTERone 150 MG/ML injection Commonly known as:  DEPO-PROVERA Inject 1 mL (150 mg total) into the muscle every 3 (three) months.   morphine 60 MG 12 hr tablet Commonly known as:  MS CONTIN Take 60 mg by mouth every 12 (twelve) hours as needed for pain.   oxyCODONE 15 MG immediate release tablet Commonly known as:  ROXICODONE Take 15 mg by mouth every 4 (four) hours as needed for pain.   rivaroxaban 20 MG Tabs tablet Commonly known as:  XARELTO Take 20 mg by mouth daily with supper.   valACYclovir 1000 MG tablet Commonly known as:  VALTREX Take 1 tablet (1,000 mg total) by mouth daily.        Consults:  None  Significant Diagnostic Studies:  Dg Shoulder Right  Result Date: 10/06/2016 CLINICAL DATA:  Right shoulder pain for over a month. History of sickle cell disease. EXAM: RIGHT SHOULDER - 2+ VIEW COMPARISON:  08/27/2012 FINDINGS: Sclerosis in the head of the right humerus consistent with avascular necrosis, stable in extent from prior. No evidence of fractures/ collapse. No superimposed degenerative changes or malalignment. Porta catheter on the  right.  Mild right upper lobe scarring. 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 No acute superimposed finding or degenerative change. Electronically Signed   By: Monte Fantasia M.D.   On: 10/06/2016 13:48     [facility] Course: [name], a 26 year old female was admitted to the day infusion center for pain management and extended observation.  Reviewed labs, consistent with baseline  Pain management:  D5.45 @ 125 for cellular rehydration Dilaudid IV per clinician assisted dosage. She had a total of 6 mg.  Oxycodone 15 mg po times 1.  Pain intensity decreased from 9/10 to 5/10.  Patient alert, oriented, and ambulating. She says that she can manage at home on current medication regimen  Discharge Instructions:  Resume all home medications Follow up with primary provider as scheduled Increase water intake to 64 ounces per day The patient was given clear instructions to go to ER or return to medical center if symptoms do not improve, worsen or new problems develop. The patient verbalized understanding.     Physical Exam at Discharge:  BP (!) 142/61 (BP Location: Left Arm)   Pulse 78   Temp 99.2 F (37.3 C) (Oral)   Resp 18   Ht 5\' 1"  (1.549 m)   Wt 200 lb (90.7 kg)   LMP 10/04/2016 (Exact Date)   SpO2 99%   BMI 37.79 kg/m  Physical Exam  Cardiovascular: Normal rate, regular rhythm, normal heart sounds and intact distal pulses.   Pulmonary/Chest: Effort normal and breath sounds normal.  Abdominal: Soft. Bowel sounds are normal.  Musculoskeletal: Normal range of motion. She exhibits tenderness (Left arm tenderness.). She exhibits no edema.      Disposition at Discharge: 01-Home or Self Care  Discharge Orders:   Condition at Discharge:   Stable  Time spent on Discharge:  Greater than 30 minutes.  Signed: Jermaine Neuharth M 10/31/2016, 2:54 PM

## 2016-11-01 ENCOUNTER — Emergency Department (HOSPITAL_COMMUNITY)
Admission: EM | Admit: 2016-11-01 | Discharge: 2016-11-01 | Disposition: A | Discharge status: Home / Self Care | Payer: Medicare Other | Attending: Emergency Medicine | Admitting: Emergency Medicine

## 2016-11-01 ENCOUNTER — Encounter (HOSPITAL_COMMUNITY): Payer: Self-pay

## 2016-11-01 ENCOUNTER — Non-Acute Institutional Stay (HOSPITAL_BASED_OUTPATIENT_CLINIC_OR_DEPARTMENT_OTHER)
Admission: AD | Admit: 2016-11-01 | Discharge: 2016-11-01 | Disposition: A | Discharge status: Home / Self Care | Payer: Medicare Other | Source: Ambulatory Visit | Attending: Internal Medicine | Admitting: Internal Medicine

## 2016-11-01 DIAGNOSIS — F633 Trichotillomania: Secondary | ICD-10-CM

## 2016-11-01 DIAGNOSIS — D5700 Hb-SS disease with crisis, unspecified: Secondary | ICD-10-CM

## 2016-11-01 DIAGNOSIS — D571 Sickle-cell disease without crisis: Secondary | ICD-10-CM

## 2016-11-01 DIAGNOSIS — Z7901 Long term (current) use of anticoagulants: Secondary | ICD-10-CM

## 2016-11-01 DIAGNOSIS — Z79899 Other long term (current) drug therapy: Secondary | ICD-10-CM | POA: Insufficient documentation

## 2016-11-01 DIAGNOSIS — Z87891 Personal history of nicotine dependence: Secondary | ICD-10-CM

## 2016-11-01 DIAGNOSIS — Z91040 Latex allergy status: Secondary | ICD-10-CM | POA: Diagnosis not present

## 2016-11-01 DIAGNOSIS — G8929 Other chronic pain: Secondary | ICD-10-CM | POA: Insufficient documentation

## 2016-11-01 DIAGNOSIS — F329 Major depressive disorder, single episode, unspecified: Secondary | ICD-10-CM | POA: Insufficient documentation

## 2016-11-01 DIAGNOSIS — K219 Gastro-esophageal reflux disease without esophagitis: Secondary | ICD-10-CM

## 2016-11-01 DIAGNOSIS — M25531 Pain in right wrist: Secondary | ICD-10-CM | POA: Diagnosis present

## 2016-11-01 LAB — CBC WITH DIFFERENTIAL/PLATELET
Basophils Absolute: 0 10*3/uL (ref 0.0–0.1)
Basophils Relative: 0 %
EOS ABS: 0.3 10*3/uL (ref 0.0–0.7)
Eosinophils Relative: 2 %
HCT: 26.1 % — ABNORMAL LOW (ref 36.0–46.0)
Hemoglobin: 8.8 g/dL — ABNORMAL LOW (ref 12.0–15.0)
LYMPHS ABS: 1.9 10*3/uL (ref 0.7–4.0)
Lymphocytes Relative: 12 %
MCH: 28.1 pg (ref 26.0–34.0)
MCHC: 33.7 g/dL (ref 30.0–36.0)
MCV: 83.4 fL (ref 78.0–100.0)
MONOS PCT: 9 %
Monocytes Absolute: 1.4 10*3/uL — ABNORMAL HIGH (ref 0.1–1.0)
NEUTROS ABS: 12.1 10*3/uL — AB (ref 1.7–7.7)
NRBC: 1 /100{WBCs} — AB
Neutrophils Relative %: 77 %
PLATELETS: 607 10*3/uL — AB (ref 150–400)
RBC: 3.13 MIL/uL — ABNORMAL LOW (ref 3.87–5.11)
RDW: 20.9 % — ABNORMAL HIGH (ref 11.5–15.5)
WBC: 15.7 10*3/uL — ABNORMAL HIGH (ref 4.0–10.5)

## 2016-11-01 LAB — BASIC METABOLIC PANEL
Anion gap: 9 (ref 5–15)
BUN: 8 mg/dL (ref 6–20)
CALCIUM: 9.1 mg/dL (ref 8.9–10.3)
CO2: 23 mmol/L (ref 22–32)
CREATININE: 0.61 mg/dL (ref 0.44–1.00)
Chloride: 106 mmol/L (ref 101–111)
GFR calc Af Amer: >60 mL/min (ref >60)
GFR calc non Af Amer: >60 mL/min (ref >60)
GLUCOSE: 102 mg/dL — AB (ref 65–99)
POTASSIUM: 4.2 mmol/L (ref 3.5–5.1)
SODIUM: 138 mmol/L (ref 135–145)

## 2016-11-01 LAB — RETICULOCYTES
RBC.: 3.13 MIL/uL — AB (ref 3.87–5.11)
RETIC COUNT ABSOLUTE: 535.2 10*3/uL — AB (ref 19.0–186.0)
RETIC CT PCT: 17.1 % — AB (ref 0.4–3.1)

## 2016-11-01 MED ORDER — HYDROMORPHONE HCL 1 MG/ML IJ SOLN
2.0000 mg | Freq: Once | INTRAMUSCULAR | Status: AC
Start: 2016-11-01 — End: 2016-11-01
  Administered 2016-11-01: 2 mg via INTRAVENOUS
  Filled 2016-11-01: qty 2

## 2016-11-01 MED ORDER — HYDROMORPHONE HCL 1 MG/ML IJ SOLN: 2.0000 mg | INTRAMUSCULAR | Status: AC

## 2016-11-01 MED ORDER — DIPHENHYDRAMINE HCL 50 MG/ML IJ SOLN
25.0000 mg | Freq: Once | INTRAMUSCULAR | Status: AC
  Administered 2016-11-01: 25 mg via INTRAVENOUS
  Filled 2016-11-01: qty 1

## 2016-11-01 MED ORDER — HYDROMORPHONE HCL 1 MG/ML IJ SOLN
2.0000 mg | INTRAMUSCULAR | Status: AC
  Filled 2016-11-01: qty 2

## 2016-11-01 MED ORDER — OXYCODONE HCL 5 MG PO TABS
15.0000 mg | ORAL_TABLET | Freq: Once | ORAL | Status: AC
  Administered 2016-11-01: 15 mg via ORAL
  Filled 2016-11-01: qty 3

## 2016-11-01 MED ORDER — HYDROMORPHONE HCL 1 MG/ML IJ SOLN
2.0000 mg | INTRAMUSCULAR | Status: AC
  Administered 2016-11-01: 2 mg via INTRAVENOUS

## 2016-11-01 MED ORDER — KETOROLAC TROMETHAMINE 30 MG/ML IJ SOLN
30.0000 mg | Freq: Once | INTRAMUSCULAR | Status: AC
  Administered 2016-11-01: 30 mg via INTRAVENOUS
  Filled 2016-11-01: qty 1

## 2016-11-01 MED ORDER — HYDROMORPHONE HCL 1 MG/ML IJ SOLN
2.0000 mg | INTRAMUSCULAR | Status: AC
  Administered 2016-11-01: 2 mg via INTRAVENOUS
  Filled 2016-11-01: qty 2

## 2016-11-01 MED ORDER — HYDROMORPHONE HCL 1 MG/ML IJ SOLN: 2.0000 mg | Freq: Once | INTRAMUSCULAR | Status: DC

## 2016-11-01 MED ORDER — DIPHENHYDRAMINE HCL 50 MG/ML IJ SOLN: 25.0000 mg | Freq: Once | INTRAMUSCULAR | Status: DC

## 2016-11-01 MED ORDER — KETOROLAC TROMETHAMINE 30 MG/ML IJ SOLN
30.0000 mg | INTRAMUSCULAR | Status: AC
  Administered 2016-11-01: 30 mg via INTRAVENOUS
  Filled 2016-11-01: qty 1

## 2016-11-01 MED ORDER — HEPARIN SOD (PORK) LOCK FLUSH 100 UNIT/ML IV SOLN
500.0000 [IU] | INTRAVENOUS | Status: AC | PRN
  Administered 2016-11-01: 500 [IU]

## 2016-11-01 MED ORDER — OXYCODONE HCL 5 MG PO TABS
ORAL_TABLET | ORAL | Status: AC
  Filled 2016-11-01: qty 1

## 2016-11-01 MED ORDER — DEXTROSE-NACL 5-0.45 % IV SOLN
INTRAVENOUS | Status: DC
  Administered 2016-11-01: 03:00:00 via INTRAVENOUS

## 2016-11-01 MED ORDER — DEXTROSE-NACL 5-0.45 % IV SOLN
INTRAVENOUS | Status: DC
  Administered 2016-11-01: 11:00:00 via INTRAVENOUS

## 2016-11-01 MED ORDER — ONDANSETRON HCL 4 MG/2ML IJ SOLN
4.0000 mg | Freq: Once | INTRAMUSCULAR | Status: AC
  Administered 2016-11-01: 4 mg via INTRAVENOUS
  Filled 2016-11-01: qty 2

## 2016-11-01 MED ORDER — SODIUM CHLORIDE 0.9% FLUSH
10.0000 mL | INTRAVENOUS | Status: AC | PRN
  Administered 2016-11-01: 10 mL

## 2016-11-01 NOTE — ED Triage Notes (Signed)
Pt was here earlier at the [facility]le cell clinic and felt better and decided not to be admitted She states that her arm is swollen and aches

## 2016-11-01 NOTE — ED Notes (Signed)
Patient is alert and oriented x3.  She was given DC instructions and follow up visit instructions.  Patient gave verbal understanding. She was DC ambulatory under her own power to home.  V/S stable.  He was not showing any signs of distress on DC 

## 2016-11-01 NOTE — Discharge Instructions (Addendum)
Please read and follow all provided instructions.  Your diagnoses today include:  1. Sickle cell pain crisis ([place])     Tests performed today include: Vital signs. See below for your results today.   Medications prescribed:  Take as prescribed   Home care instructions:  Follow any educational materials contained in this packet.  Follow-up instructions: Please follow-up with your primary care provider for further evaluation of symptoms and treatment   Return instructions:  Please return to the Emergency Department if you do not get better, if you get worse, or new symptoms OR  - Fever (temperature greater than 101.54F)  - Bleeding that does not stop with holding pressure to the area    -Severe pain (please note that you may be more sore the day after your accident)  - Chest Pain  - Difficulty breathing  - Severe nausea or vomiting  - Inability to tolerate food and liquids  - Passing out  - Skin becoming red around your wounds  - Change in mental status (confusion or lethargy)  - New numbness or weakness    Please return if you have any other emergent concerns.  Additional Information:  Your vital signs today were: BP 114/71 (BP Location: Left Arm)    Pulse 75    Temp 99.2 F (37.3 C) (Oral)    Resp 12    Ht 5\' 1"  (1.549 m)    Wt 91.6 kg (202 lb)    LMP 10/04/2016 (Exact Date)    SpO2 100%    BMI 38.17 kg/m  If your blood pressure (BP) was elevated above 135/85 this visit, please have this repeated by your doctor within one month. ---------------

## 2016-11-01 NOTE — ED Provider Notes (Signed)
Hand-off from Shary Decamp, PA-C.   See initial provider's note for full HPI.  Briefly, pt is a 26 yo female with sickle cell anemia, DVT on Xarelto who presents to the Ed with complaint of right wrist pain. Pain worse with movement. Pt was seen in the sickle cell clinic yesterday for same sxs, declines admission due to having to pick up her kids. No improvement with pain meds at home.   Physical Exam  BP 114/71 (BP Location: Left Arm)   Pulse 75   Temp 99.2 F (37.3 C) (Oral)   Resp 12   Ht 5\' 1"  (1.549 m)   Wt 91.6 kg (202 lb)   LMP 10/04/2016 (Exact Date)   SpO2 100%   BMI 38.17 kg/m   Physical Exam  Constitutional: She is oriented to person, place, and time. She appears well-developed and well-nourished.  HENT:  Head: Normocephalic and atraumatic.  Eyes: Conjunctivae and EOM are normal. Right eye exhibits no discharge. Left eye exhibits no discharge. No scleral icterus.  Neck: Normal range of motion. Neck supple.  Cardiovascular: Normal rate, regular rhythm, normal heart sounds and intact distal pulses.   Pulmonary/Chest: Effort normal and breath sounds normal. No respiratory distress. She has no wheezes. She has no rales. She exhibits no tenderness.  Musculoskeletal:  Diffuse TTP over thenar eminence of right thumb and right wrist. No swelling, erythema, warmth, abrasion, laceration, ecchymoses. Dec ROM of right wrist due to pain. FROM of right hand, forearm and elbow. Sensation grossly intact. 2+ radial pulse. Cap refill <2.   Neurological: She is alert and oriented to person, place, and time.  Skin: Skin is warm and dry. She is not diaphoretic.  Nursing note and vitals reviewed.   ED Course  Procedures  MDM Pt presents with right wrist pain consistent with sickle cell pain crisis. Denies fever, CP, SOB. Exam preformed by initial provider showed dec ROM of right wrist due to pain, no erythema or signs of infection, neurovascularly intact. Pt given IV dilaudid, toradol,  benadryl and zofran. Labs consistent with pt's baseline values. No relief after 3 doses of pain meds in the ED. Initial provider discussed with pt plan to follow up at sickle cell clinic in the morning when it opened, pt declined admission and states she would rather wait for day clinic to open at 8am.   On my initial evaluation patient continues to reports continued pain to right wrist. I spoke with nurse ([place]) at sickle cell clinic. Plan to d/c pt from ED and have ED staff take pt to sickle cell clinic for further management of sickle cell crisis. Discussed results and plan for discharge with patient his in agreement.       Nona Dell, PA-C 11/01/16 [id number]    Ripley Fraise, MD 11/02/16 0157

## 2016-11-01 NOTE — Progress Notes (Signed)
Pt arrived by wheelchair from [facility] ED; pt port is accessed and infusing NS; pt alert, oriented, and ambulatory; NP notified of pt's arrival

## 2016-11-01 NOTE — Discharge Summary (Signed)
[facility] Discharge Summary   Patient ID: [name] MRN: [id number] DOB/AGE: Jul 29, 1990 26 y.o.  Admit date: 11/01/2016 Discharge date: 11/01/2016  Primary Care Physician:  Ricke Hey, MD  Admission Diagnoses:  Active Problems:   Sickle cell anemia with pain [facility])  Discharge Medications:  Allergies as of 11/01/2016      Reactions   Food Hives, Other (See Comments)   Pt is allergic to carrots.     Latex Rash      Medication List    ASK your doctor about these medications   hydroxyurea 500 MG capsule Commonly known as:  HYDREA Take 2 capsules (1,000 mg total) by mouth daily. May take with food to minimize GI side effects.   L-glutamine 5 g Pack Powder Packet Commonly known as:  ENDARI Take 15 g by mouth 2 (two) times daily.   medroxyPROGESTERone 150 MG/ML injection Commonly known as:  DEPO-PROVERA Inject 1 mL (150 mg total) into the muscle every 3 (three) months.   morphine 60 MG 12 hr tablet Commonly known as:  MS CONTIN Take 60 mg by mouth every 12 (twelve) hours as needed for pain.   oxyCODONE 15 MG immediate release tablet Commonly known as:  ROXICODONE Take 15 mg by mouth every 4 (four) hours as needed for pain.   rivaroxaban 20 MG Tabs tablet Commonly known as:  XARELTO Take 20 mg by mouth daily with supper.   valACYclovir 1000 MG tablet Commonly known as:  VALTREX Take 1 tablet (1,000 mg total) by mouth daily.        Consults:  None  Significant Diagnostic Studies:  Dg Shoulder Right  Result Date: 10/06/2016 CLINICAL DATA:  Right shoulder pain for over a month. History of sickle cell disease. EXAM: RIGHT SHOULDER - 2+ VIEW COMPARISON:  08/27/2012 FINDINGS: Sclerosis in the head of the right humerus consistent with avascular necrosis, stable in extent from prior. No evidence of fractures/ collapse. No superimposed degenerative changes or malalignment. Porta catheter on the right.  Mild right upper lobe scarring.  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 No acute superimposed finding or degenerative change. Electronically Signed   By: Monte Fantasia M.D.   On: 10/06/2016 13:48     [facility] Course: [name] transitioned from the emergency department this am for pain management and extended observation.  Patient arrived in stable condition with chest port accessed.  Patient was sleeping on arrival, but arousable.  Reviewed laboratory values, mild leukocytosis.   Pain management:  Continued D5.45@ 125 for cellular dehydration Dilaudid 2 mg IV per clinician assisted doses Oxycodone 15 mg times 1  Pain intensity decreased from 10/10 to 7/10.  [name] will discharge home in stable condition  Discharge instructions: Patient instructed to follow up with PCP in 1 week Resume all home medications Follow up with hematology as scheduled Increase water intake to 64 ounces per day   The patient was given clear instructions to go to ER or return to medical center if symptoms do not improve, worsen or new problems develop. The patient verbalized understanding.       Physical Exam at Discharge:  BP (!) 118/47 (BP Location: Left Arm)   Pulse 91   Temp 99 F (37.2 C) (Oral)   Resp 18   LMP 10/04/2016 (Exact Date)   SpO2 100%    Physical Exam  Constitutional: She is oriented to person, place, and time.  Cardiovascular: Normal rate, regular  rhythm and normal heart sounds.   Pulmonary/Chest: Effort normal and breath sounds normal.  Abdominal: Soft. Bowel sounds are normal.  Musculoskeletal: Normal range of motion. She exhibits tenderness (right wrist tenderness).  Neurological: She is alert and oriented to person, place, and time. She has normal reflexes. Gait normal.    Disposition at Discharge: 01-Home or Self Care  Discharge Orders: Discharge Instructions    Discharge patient    Complete by:  As directed    Discharge disposition:   01-Home or Self Care   Discharge patient date:  11/01/2016      Condition at Discharge:   Stable  Time spent on Discharge:  Greater than 30 minutes.  Signed: Janille Draughon M 11/01/2016, 8:08 PM

## 2016-11-01 NOTE — Progress Notes (Signed)
Patient received from the ED c/o sickle cell related pain. Upon admission patient's pain level was 8/10 on pain scale. Patient was treated with IV fluids, Dilaudid pushes and IV Toradol. At time of discharge patient's pain score was down to 7/10 on pain scale. Discharge instructions given to patient and patient states an understanding. Patient alert, oriented, and ambulatory at time of discharge.

## 2016-11-01 NOTE — ED Provider Notes (Signed)
[facility] DEPT Provider Note   CSN: [id number] Arrival date & time: 10/31/16  2142     History   Chief Complaint Chief Complaint  Patient presents with  . Sickle Cell Pain Crisis    HPI [name] is a 26 y.o. female.  HPI  26 y.o. female with a hx of Sickle Cell Anemia, DVT in past and on Xarelto, presents to the Emergency Department today complaining of right wrist pain. Noted hx same. States pain is 10/10 and worse with movement. Pt was seen in [facility] earlier today (yesterday) and declined admission as she had children at home. Denies CP/SOB/ABD pain. No fevers. Notes home oral pain medications with minimal improvement. No other symptoms noted.   Past Medical History:  Diagnosis Date  . Blood transfusion    "lots"  . Blood transfusion without reported diagnosis   . Chronic back pain    "very severe; have knot in my back; from tight muscle; take RX and exercise for it"  . Depression 01/06/2011  . Exertional dyspnea    "sometimes"  . Genital HSV   . GERD (gastroesophageal reflux disease) 02/17/2011  . Migraines 11/08/11   "@ least twice/month"  . 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  . Mood swings ([place]) 11/08/11   "I go back and forth; real bad"  . Sickle cell anemia ([name])   . Sickle cell anemia with crisis ([place])   . Trichotillomania    h/o    Patient Active Problem List   Diagnosis Date Noted  . Sickle cell anemia with crisis ([place]) 10/02/2016  . 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 Chronic anticoagulation   . Sickle cell anemia ([place]) 05/19/2016  . History of DVT (deep vein thrombosis) 04/17/2016  . Thrombocytosis ([place]) 11/09/2015  . Hyperbilirubinemia 11/09/2015  . Chronic pain 08/04/2015  . Herpes simplex 07/14/2015  . Anemia of chronic disease   . Sickle cell crisis ([place]) 07/31/2013  . Hb-SS disease without crisis ([place]) 02/26/2013  . 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 Sickle cell disease ([place]) 01/08/2009    . 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 CHOLECYSTECTOMY  05/2010  . DILATION AND CURETTAGE OF UTERUS  02/20/11   S/P miscarriage  . IR GENERIC HISTORICAL  12/23/2015   IR FLUORO GUIDE CV LINE RIGHT 12/23/2015 Jacqulynn Cadet, MD [facility]  . IR GENERIC HISTORICAL  12/23/2015   IR US GUIDE VASC ACCESS RIGHT 12/23/2015 Jacqulynn Cadet, MD [facility]    OB History    Gravida Para Term Preterm AB Living   5 2 2  0 3 2   SAB TAB Ectopic Multiple Live Births   3 0 0 0 2      Obstetric Comments   Miscarried in October 2012 at about 7 weeks       Home Medications    Prior to Admission medications   Medication Sig Start Date End Date Taking? Authorizing Provider  hydroxyurea (HYDREA) 500 MG capsule Take 2 capsules (1,000 mg total) by mouth daily. May take with food to minimize GI side effects. Patient taking differently: Take 1,500 mg by mouth daily. May take with food to minimize GI side effects. 04/18/16  Yes Jegede, Marlena Clipper, MD  L-glutamine (ENDARI) 5 g PACK Powder Packet Take 15 g by mouth 2 (two) times daily.   Yes [provider]  medroxyPROGESTERone (DEPO-PROVERA) 150 MG/ML injection Inject 1 mL (150 mg total) into the  muscle every 3 (three) months. 09/21/16  Yes Shelly Bombard, MD  morphine (MS CONTIN) 60 MG 12 hr tablet Take 60 mg by mouth every 12 (twelve) hours as needed for pain.    Yes [provider]  oxyCODONE (ROXICODONE) 15 MG immediate release tablet Take 15 mg by mouth every 4 (four) hours as needed for pain.    Yes [provider]  rivaroxaban (XARELTO) 20 MG TABS tablet Take 20 mg by mouth daily with supper.    Yes [provider]  valACYclovir (VALTREX) 1000 MG tablet Take 1 tablet (1,000 mg total) by mouth daily. 05/02/16  Yes [name], MD    Family History Family History  Problem Relation Age of Onset  . Sickle cell trait Mother   . Sickle cell trait Father    . Diabetes Maternal Grandmother   . Diabetes Paternal Grandmother   . Hypertension Paternal Grandmother   . Diabetes Maternal Grandfather     Social History Social History  Substance Use Topics  . Smoking status: Former Smoker    Packs/day: 0.25    Years: 1.00    Types: Cigarettes    Quit date: 03/25/2013  . Smokeless tobacco: Never Used  . Alcohol use Yes     Comment: once a month     Allergies   Food and Latex   Review of Systems Review of Systems ROS reviewed and all are negative for acute change except as noted in the HPI.  Physical Exam Updated Vital Signs BP 114/71 (BP Location: Left Arm)   Pulse 75   Temp 99.2 F (37.3 C) (Oral)   Resp 12   Ht 5\' 1"  (1.549 m)   Wt 91.6 kg (202 lb)   LMP 10/04/2016 (Exact Date)   SpO2 100%   BMI 38.17 kg/m   Physical Exam  Constitutional: She is oriented to person, place, and time. Vital signs are normal. She appears well-developed and well-nourished.  HENT:  Head: Normocephalic and atraumatic.  Right Ear: Hearing normal.  Left Ear: Hearing normal.  Eyes: Conjunctivae and EOM are normal. Pupils are equal, round, and reactive to light.  Neck: Normal range of motion. Neck supple.  Cardiovascular: Normal rate, regular rhythm, normal heart sounds and intact distal pulses.   Pulmonary/Chest: Effort normal and breath sounds normal.  Abdominal: Soft.  Musculoskeletal: Normal range of motion.  Right wrist with limited ROM due to pain. NVI. Distal pulses appreciated. No obvious swelling. No erythema or sign of infection.   Neurological: She is alert and oriented to person, place, and time.  Skin: Skin is warm and dry.  Psychiatric: She has a normal mood and affect. Her speech is normal and behavior is normal. Thought content normal.  Nursing note and vitals reviewed.    ED Treatments / Results  Labs (all labs ordered are listed, but only abnormal results are displayed) Labs Reviewed  RETICULOCYTES - Abnormal; Notable  for the following:       Result Value   Retic Ct Pct 17.1 (*)    RBC. 3.13 (*)    Retic Count, Manual 535.2 (*)    All other components within normal limits  BASIC METABOLIC PANEL - Abnormal; Notable for the following:    Glucose, Bld 102 (*)    All other components within normal limits  CBC WITH DIFFERENTIAL/PLATELET - Abnormal; Notable for the following:    WBC 15.7 (*)    RBC 3.13 (*)    Hemoglobin 8.8 (*)  HCT 26.1 (*)    RDW 20.9 (*)    Platelets 607 (*)    nRBC 1 (*)    Neutro Abs 12.1 (*)    Monocytes Absolute 1.4 (*)    All other components within normal limits    EKG  EKG Interpretation None       Radiology No results found.  Procedures Procedures (including critical care time)  Medications Ordered in ED Medications  dextrose 5 %-0.45 % sodium chloride infusion ( Intravenous New Bag/Given 11/01/16 0237)  HYDROmorphone (DILAUDID) injection 2 mg (not administered)    Or  HYDROmorphone (DILAUDID) injection 2 mg (not administered)  HYDROmorphone (DILAUDID) injection 2 mg (2 mg Intravenous Given 11/01/16 0430)    Or  HYDROmorphone (DILAUDID) injection 2 mg ( Subcutaneous See Alternative 11/01/16 0430)  HYDROmorphone (DILAUDID) injection 2 mg (not administered)  ketorolac (TORADOL) 30 MG/ML injection 30 mg (30 mg Intravenous Given 11/01/16 0233)  HYDROmorphone (DILAUDID) injection 2 mg (2 mg Intravenous Given 11/01/16 0230)    Or  HYDROmorphone (DILAUDID) injection 2 mg ( Subcutaneous See Alternative 11/01/16 0230)  HYDROmorphone (DILAUDID) injection 2 mg (2 mg Intravenous Given 11/01/16 0314)    Or  HYDROmorphone (DILAUDID) injection 2 mg ( Subcutaneous See Alternative 11/01/16 0314)  diphenhydrAMINE (BENADRYL) injection 25 mg (25 mg Intravenous Given 11/01/16 0233)  ondansetron (ZOFRAN) injection 4 mg (4 mg Intravenous Given 11/01/16 0237)  diphenhydrAMINE (BENADRYL) injection 25 mg (25 mg Intravenous Given 11/01/16 0430)     Initial Impression / Assessment and  Plan / ED Course  I have reviewed the triage vital signs and the nursing notes.  Pertinent labs & imaging results that were available during my care of the patient were reviewed by me and considered in my medical decision making (see chart for details).  Final Clinical Impressions(s) / ED Diagnoses  {I have reviewed and evaluated the relevant laboratory values.   {I have reviewed the relevant previous healthcare records.  {I obtained HPI from historian.   ED Course:  Assessment: Pt presents to the ED in Sickle Cell Crisis. The pain is normal per the patients sickle cell pain, which includes pain in right wrist. The patient denies recent fevers or illness, CP, SOB, weakness. The patient has tried taking pain medications at home but has not been able to control the pain. The patient seen [facility] earlier today and declined admission due to children at home. Pt currently in pain but in not acute distress. Given IV pain medication per sickle cell protocol as well as Toradol, Benadryl, and Zofran. Fluids given. After x 3 dose pt states pain is still persistent. Discussed with a patient and she opted to wait until [facility] is open. Offered admission to hospital, but pt declined and would rather wait for day clinic. Will continue sickle protocol in ED until 8am. On reexamination, pt continues to deny CP/SOB.   Disposition/Plan:  Sign out to Harlene Ramus, PA-C Anticipate DC from ED to [facility] Day Clinic Pt acknowledges and agrees with plan  Supervising Physician Ripley Fraise, MD  Final diagnoses:  Sickle cell pain crisis [facility])    New Prescriptions New Prescriptions   No medications on file     Shary Decamp, Hershal Coria 11/01/16 1856    Ripley Fraise, MD 11/02/16 [phone]

## 2016-11-01 NOTE — H&P (Signed)
[facility] History and Physical   Date: 11/01/2016  Patient name: [name] Medical record number: [id number] Date of birth: 1990-05-29 Age: 26 y.o. Gender: female PCP: Ricke Hey, MD  Attending physician: Tresa Garter, MD  Chief Complaint: Right wrist pain  History of Present Illness: [name], a 26 year old female with a history of sickle cell anemia, HbSS presents complaining of right wrist pain that is consistent with typical sickle cell pain. She says that pain started in right wrist late yesterday evening.  She was evaluated in the emergency department this am. I spoke with Adron Bene, PA, we agree that patient is a candidate for extended observation and pain management. She was evaluated in the day infusion center on 10/31/2016 for left arm pain, which has resolved.  She attributes current pain crisis to hot weather at an outdoor birthday party. Pain intensity is 7/10 described as intermittent and aching. She had 10 mg Dilaudid and Benadryl in the emergency department prior to arrival per clinician assisted doses. Patient was sleeping on arrival. She is arousable. She denies headache, fatigue, chest pain, shortness of breath, dysuria, nausea, vomiting, or diarrhea.   Meds: Prescriptions Prior to Admission  Medication Sig Dispense Refill Last Dose  . hydroxyurea (HYDREA) 500 MG capsule Take 2 capsules (1,000 mg total) by mouth daily. May take with food to minimize GI side effects. (Patient taking differently: Take 1,500 mg by mouth daily. May take with food to minimize GI side effects.) 180 capsule 3 10/31/2016 at Unknown time  . L-glutamine (ENDARI) 5 g PACK Powder Packet Take 15 g by mouth 2 (two) times daily.   Past Week at Unknown time  . medroxyPROGESTERone (DEPO-PROVERA) 150 MG/ML injection Inject 1 mL (150 mg total) into the muscle every 3 (three) months. 1 mL 3 May  . morphine (MS CONTIN) 60 MG 12 hr tablet Take 60 mg by mouth  every 12 (twelve) hours as needed for pain.    10/31/2016 at Unknown time  . oxyCODONE (ROXICODONE) 15 MG immediate release tablet Take 15 mg by mouth every 4 (four) hours as needed for pain.    10/31/2016 at Unknown time  . rivaroxaban (XARELTO) 20 MG TABS tablet Take 20 mg by mouth daily with supper.    10/31/2016 at 1000  . valACYclovir (VALTREX) 1000 MG tablet Take 1 tablet (1,000 mg total) by mouth daily. 30 tablet 1 Past Week at Unknown time    Allergies: Food and Latex Past Medical History:  Diagnosis Date  . Blood transfusion    "lots"  . Blood transfusion without reported diagnosis   . Chronic back pain    "very severe; have knot in my back; from tight muscle; take RX and exercise for it"  . Depression 01/06/2011  . Exertional dyspnea    "sometimes"  . Genital HSV   . GERD (gastroesophageal reflux disease) 02/17/2011  . Migraines 11/08/11   "@ least twice/month"  . 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  . Mood swings ([place]) 11/08/11   "I go back and forth; real bad"  . Sickle cell anemia ([name])   . Sickle cell anemia with crisis ([place])   . Trichotillomania    h/o   Past Surgical History:  Procedure Laterality Date  . CHOLECYSTECTOMY  05/2010  . DILATION AND CURETTAGE OF UTERUS  02/20/11   S/P miscarriage  . IR GENERIC HISTORICAL  12/23/2015   IR FLUORO GUIDE CV LINE RIGHT 12/23/2015 Jacqulynn Cadet,  MD [facility]  . IR GENERIC HISTORICAL  12/23/2015   IR US GUIDE VASC ACCESS RIGHT 12/23/2015 Jacqulynn Cadet, MD [facility]   Family History  Problem Relation Age of Onset  . Sickle cell trait Mother   . Sickle cell trait Father   . Diabetes Maternal Grandmother   . Diabetes Paternal Grandmother   . Hypertension Paternal Grandmother   . Diabetes Maternal Grandfather    Social History   Social History  . Marital status: Single    Spouse name: N/A  . Number of children: N/A  . Years of education: N/A   Occupational History  . Not on file.   Social  History Main Topics  . Smoking status: Former Smoker    Packs/day: 0.25    Years: 1.00    Types: Cigarettes    Quit date: 03/25/2013  . Smokeless tobacco: Never Used  . Alcohol use Yes     Comment: once a month  . Drug use: No     Comment: "Clean for 3 months"  . Sexual activity: Yes    Partners: Male    Birth control/ protection: None     Comment: last sex Jul 11 2015   Other Topics Concern  . 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  Negative for malaise/fatigue.  HENT: Negative.   Eyes: Negative.   Respiratory: Negative.  Negative for shortness of breath.   Cardiovascular: Negative.  Negative for chest pain and leg swelling.  Gastrointestinal: Negative.  Negative for constipation, diarrhea, nausea and vomiting.  Genitourinary: Negative.  Negative for dysuria.  Musculoskeletal: Positive for myalgias (right wrist pain).  Skin: Negative.   Neurological: Negative.  Negative for dizziness.  Endo/Heme/Allergies: Negative.   Psychiatric/Behavioral: Negative.      Physical Exam: Blood pressure (!) 118/47, pulse 91, temperature 99 F (37.2 C), temperature source Oral, resp. rate 18, last menstrual period 10/04/2016, SpO2 100 %, not currently breastfeeding. Physical Exam  Constitutional: She is oriented to person, place, and time and well-developed, well-nourished, and in no distress.  HENT:  Head: Normocephalic and atraumatic.  Right Ear: External ear normal.  Left Ear: External ear normal.  Mouth/Throat: Oropharynx is clear and moist.  Eyes: Conjunctivae and EOM are normal. Pupils are equal, round, and reactive to light.  Neck: Normal range of motion. Neck supple.  Cardiovascular: Normal rate, regular rhythm, normal heart sounds and intact distal pulses.   Pulmonary/Chest: Breath sounds normal.   Abdominal: Soft. Bowel sounds are normal.  Musculoskeletal: Normal range of motion. She exhibits tenderness (right forearm tender to palpation, 3/5 weakness, decreased ROM). She exhibits no edema.  Neurological: She is alert and oriented to person, place, and time. Gait normal.  Skin: Skin is warm and dry.    Lab results: Results for orders placed or performed during the hospital encounter of 11/01/16 (from the past 24 hour(s))  Reticulocytes     Status: Abnormal   Collection Time: 11/01/16  2:37 AM  Result Value Ref Range   Retic Ct Pct 17.1 (H) 0.4 - 3.1 %   RBC. 3.13 (L) 3.87 - 5.11 MIL/uL   Retic Count, Manual 535.2 (H) 19.0 - 186.0 K/uL  Basic metabolic panel     Status: Abnormal   Collection Time: 11/01/16  2:37 AM  Result  Value Ref Range   Sodium 138 135 - 145 mmol/L   Potassium 4.2 3.5 - 5.1 mmol/L   Chloride 106 101 - 111 mmol/L   CO2 23 22 - 32 mmol/L   Glucose, Bld 102 (H) 65 - 99 mg/dL   BUN 8 6 - 20 mg/dL   Creatinine, Ser 0.61 0.44 - 1.00 mg/dL   Calcium 9.1 8.9 - 10.3 mg/dL   GFR calc non Af Amer >60 >60 mL/min   GFR calc Af Amer >60 >60 mL/min   Anion gap 9 5 - 15  CBC WITH DIFFERENTIAL     Status: Abnormal   Collection Time: 11/01/16  2:37 AM  Result Value Ref Range   WBC 15.7 (H) 4.0 - 10.5 K/uL   RBC 3.13 (L) 3.87 - 5.11 MIL/uL   Hemoglobin 8.8 (L) 12.0 - 15.0 g/dL   HCT 26.1 (L) 36.0 - 46.0 %   MCV 83.4 78.0 - 100.0 fL   MCH 28.1 26.0 - 34.0 pg   MCHC 33.7 30.0 - 36.0 g/dL   RDW 20.9 (H) 11.5 - 15.5 %   Platelets 607 (H) 150 - 400 K/uL   Neutrophils Relative % 77 %   Lymphocytes Relative 12 %   Monocytes Relative 9 %   Eosinophils Relative 2 %   Basophils Relative 0 %   nRBC 1 (H) 0 /100 WBC   Neutro Abs 12.1 (H) 1.7 - 7.7 K/uL   Lymphs Abs 1.9 0.7 - 4.0 K/uL   Monocytes Absolute 1.4 (H) 0.1 - 1.0 K/uL   Eosinophils Absolute 0.3 0.0 - 0.7 K/uL   Basophils Absolute 0.0 0.0 - 0.1 K/uL   RBC Morphology RARE NRBCs    Smear Review GIANT PLATELETS  SEEN    *Note: Due to a large number of results and/or encounters for the requested time period, some results have not been displayed. A complete set of results can be found in Results Review.    Imaging results:  No results found.   Assessment & Plan:   Patient will be admitted to the day infusion center for extended observation  Start IV D5.45 for cellular rehydration at 125/hr  Will hold IV medications due to increased sedation   Patient will be re-evaluated for pain intensity in the context of function and relationship to baseline as care progresses.  If no significant pain relief, will transfer patient to inpatient services for a higher level of care.  Reviewed labs, consistent with baseline   Cayton Cuevas M 11/01/2016, 10:18 AM

## 2016-11-01 NOTE — ED Notes (Signed)
Pt declining subcutaneous dilaudid (as part of standing order set at this time).

## 2016-11-06 ENCOUNTER — Encounter (HOSPITAL_COMMUNITY): Payer: Self-pay | Admitting: Emergency Medicine

## 2016-11-06 ENCOUNTER — Inpatient Hospital Stay (HOSPITAL_COMMUNITY)
Admission: EM | Admit: 2016-11-06 | Discharge: 2016-11-11 | DRG: 812 | Disposition: A | Discharge status: Home / Self Care | Payer: Medicare Other | Attending: Internal Medicine | Admitting: Internal Medicine

## 2016-11-06 DIAGNOSIS — R197 Diarrhea, unspecified: Secondary | ICD-10-CM | POA: Diagnosis not present

## 2016-11-06 DIAGNOSIS — Z7901 Long term (current) use of anticoagulants: Secondary | ICD-10-CM | POA: Diagnosis not present

## 2016-11-06 DIAGNOSIS — K5289 Other specified noninfective gastroenteritis and colitis: Secondary | ICD-10-CM | POA: Diagnosis not present

## 2016-11-06 DIAGNOSIS — R112 Nausea with vomiting, unspecified: Secondary | ICD-10-CM

## 2016-11-06 DIAGNOSIS — R7989 Other specified abnormal findings of blood chemistry: Secondary | ICD-10-CM | POA: Diagnosis not present

## 2016-11-06 DIAGNOSIS — Z86718 Personal history of other venous thrombosis and embolism: Secondary | ICD-10-CM | POA: Diagnosis not present

## 2016-11-06 DIAGNOSIS — Z793 Long term (current) use of hormonal contraceptives: Secondary | ICD-10-CM | POA: Diagnosis not present

## 2016-11-06 DIAGNOSIS — Z9114 Patient's other noncompliance with medication regimen: Secondary | ICD-10-CM | POA: Diagnosis not present

## 2016-11-06 DIAGNOSIS — R1110 Vomiting, unspecified: Secondary | ICD-10-CM | POA: Diagnosis present

## 2016-11-06 DIAGNOSIS — Z87891 Personal history of nicotine dependence: Secondary | ICD-10-CM

## 2016-11-06 DIAGNOSIS — D5700 Hb-SS disease with crisis, unspecified: Secondary | ICD-10-CM | POA: Diagnosis not present

## 2016-11-06 DIAGNOSIS — F1123 Opioid dependence with withdrawal: Secondary | ICD-10-CM | POA: Insufficient documentation

## 2016-11-06 DIAGNOSIS — Z91018 Allergy to other foods: Secondary | ICD-10-CM

## 2016-11-06 DIAGNOSIS — K529 Noninfective gastroenteritis and colitis, unspecified: Secondary | ICD-10-CM

## 2016-11-06 DIAGNOSIS — G894 Chronic pain syndrome: Secondary | ICD-10-CM | POA: Diagnosis present

## 2016-11-06 DIAGNOSIS — Z91040 Latex allergy status: Secondary | ICD-10-CM

## 2016-11-06 DIAGNOSIS — A6000 Herpesviral infection of urogenital system, unspecified: Secondary | ICD-10-CM | POA: Diagnosis present

## 2016-11-06 DIAGNOSIS — F1193 Opioid use, unspecified with withdrawal: Secondary | ICD-10-CM | POA: Insufficient documentation

## 2016-11-06 HISTORY — DX: Essential (hemorrhagic) thrombocythemia: D47.3

## 2016-11-06 LAB — RETICULOCYTES
RBC.: 2.91 MIL/uL — ABNORMAL LOW (ref 3.87–5.11)
Retic Count, Absolute: 579.1 10*3/uL — ABNORMAL HIGH (ref 19.0–186.0)
Retic Ct Pct: 19.9 % — ABNORMAL HIGH (ref 0.4–3.1)

## 2016-11-06 LAB — I-STAT BETA HCG BLOOD, ED (MC, WL, AP ONLY): I-stat hCG, quantitative: <5 m[IU]/mL (ref <5)

## 2016-11-06 LAB — COMPREHENSIVE METABOLIC PANEL
ALBUMIN: 4.2 g/dL (ref 3.5–5.0)
ALT: 11 U/L — AB (ref 14–54)
ANION GAP: 8 (ref 5–15)
AST: 20 U/L (ref 15–41)
Alkaline Phosphatase: 73 U/L (ref 38–126)
BILIRUBIN TOTAL: 2.3 mg/dL — AB (ref 0.3–1.2)
BUN: 7 mg/dL (ref 6–20)
CO2: 24 mmol/L (ref 22–32)
CREATININE: 0.6 mg/dL (ref 0.44–1.00)
Calcium: 9 mg/dL (ref 8.9–10.3)
Chloride: 107 mmol/L (ref 101–111)
GFR calc Af Amer: >60 mL/min (ref >60)
GFR calc non Af Amer: >60 mL/min (ref >60)
GLUCOSE: 119 mg/dL — AB (ref 65–99)
Potassium: 3.7 mmol/L (ref 3.5–5.1)
SODIUM: 139 mmol/L (ref 135–145)
TOTAL PROTEIN: 7.3 g/dL (ref 6.5–8.1)

## 2016-11-06 LAB — CBC WITH DIFFERENTIAL/PLATELET
BASOS PCT: 0 %
Basophils Absolute: 0.1 10*3/uL (ref 0.0–0.1)
EOS PCT: 2 %
Eosinophils Absolute: 0.4 10*3/uL (ref 0.0–0.7)
HEMATOCRIT: 24.5 % — AB (ref 36.0–46.0)
Hemoglobin: 8.3 g/dL — ABNORMAL LOW (ref 12.0–15.0)
Lymphocytes Relative: 12 %
Lymphs Abs: 2 10*3/uL (ref 0.7–4.0)
MCH: 28.5 pg (ref 26.0–34.0)
MCHC: 33.9 g/dL (ref 30.0–36.0)
MCV: 84.2 fL (ref 78.0–100.0)
MONO ABS: 1.5 10*3/uL — AB (ref 0.1–1.0)
MONOS PCT: 9 %
NEUTROS ABS: 12.2 10*3/uL — AB (ref 1.7–7.7)
Neutrophils Relative %: 76 %
PLATELETS: 601 10*3/uL — AB (ref 150–400)
RBC: 2.91 MIL/uL — ABNORMAL LOW (ref 3.87–5.11)
RDW: 21.4 % — AB (ref 11.5–15.5)
WBC: 16.1 10*3/uL — ABNORMAL HIGH (ref 4.0–10.5)

## 2016-11-06 LAB — LIPASE, BLOOD: Lipase: 26 U/L (ref 11–51)

## 2016-11-06 IMAGING — XA IR US GUIDE VASC ACCESS RIGHT
1 series · 1 of 1 positions shown · non-contrast
Comparison: none

INDICATION: 24-year-old female with sickle cell disease and very poor venous
access. She is here for placement of a port catheter to provide
durable venous access.

[Series 300: line placements · 1 of 1 slices shown]
[im 1/1]
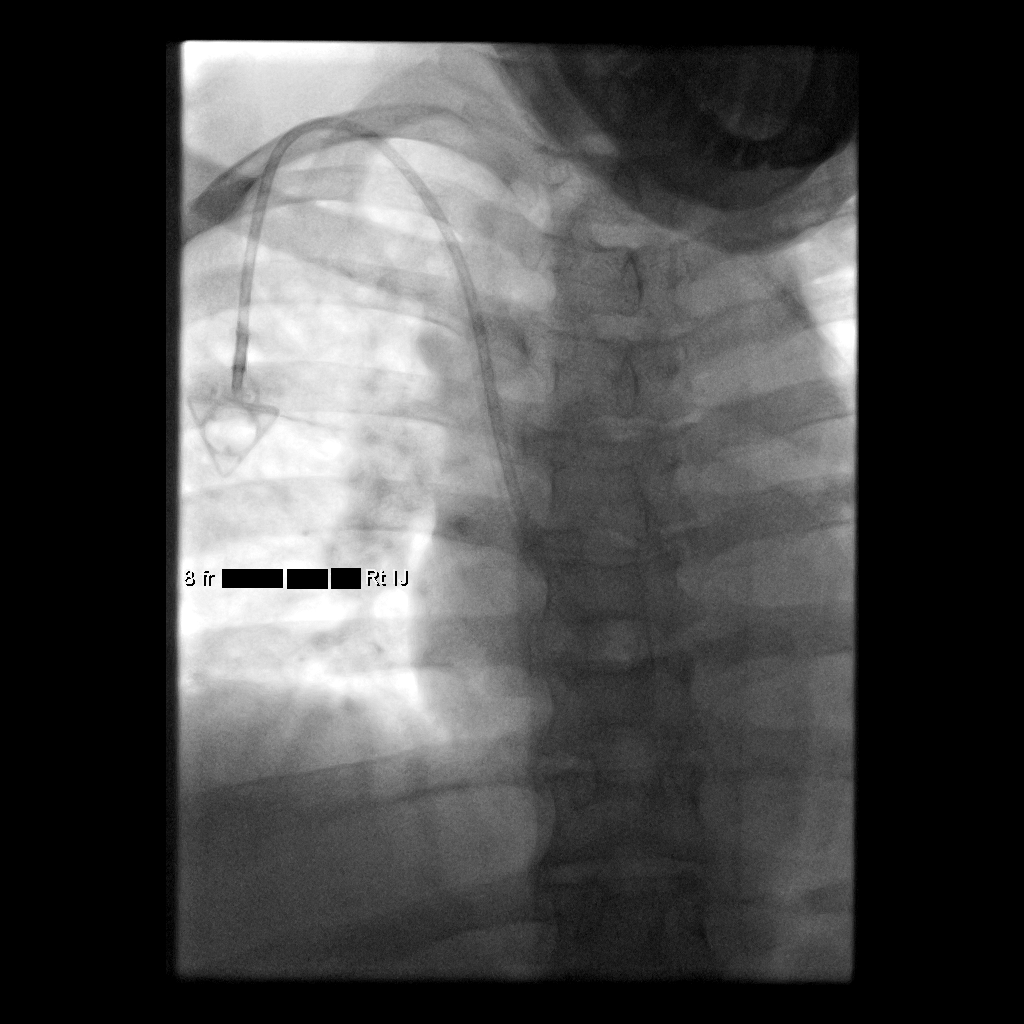

[1 of 1 positions shown; findings below may reference images not displayed]

EXAM:
IMPLANTED PORT A CATH PLACEMENT WITH ULTRASOUND AND FLUOROSCOPIC
GUIDANCE

MEDICATIONS:
2 g Ancef; The antibiotic was administered within an appropriate
time interval prior to skin puncture.

ANESTHESIA/SEDATION:
Versed 5 mg IV; Fentanyl 75 mcg IV;

Moderate Sedation Time:  28 minutes

The patient's level of consciousness and vital signs were
continuously monitored during the procedure by the interventional
radiology nurse under my direct supervision.

FLUOROSCOPY TIME:  0 minutes, 12 seconds (1.7 mGy)

COMPLICATIONS:
None immediate.

PROCEDURE:
The right neck and chest was prepped with chlorhexidine, and draped
in the usual sterile fashion using maximum barrier technique (cap
and mask, sterile gown, sterile gloves, large sterile sheet, hand
hygiene and cutaneous antiseptic). Antibiotic prophylaxis was
provided with 2g Ancef administered IV one hour prior to skin
incision. Local anesthesia was attained by infiltration with 1%
lidocaine with epinephrine.

Ultrasound demonstrated patency of the right internal jugular vein,
and this was documented with an image. Under real-time ultrasound
guidance, this vein was accessed with a 21 gauge micropuncture
needle and image documentation was performed. A small dermatotomy
was made at the access site with an 11 scalpel. A 0.018" wire was
advanced into the SVC and the access needle exchanged for a 4F
micropuncture vascular sheath. The 0.018" wire was then removed and
a 0.035" wire advanced into the IVC.

An appropriate location for the subcutaneous reservoir was selected
below the clavicle and an incision was made through the skin and
underlying soft tissues. The subcutaneous tissues were then
dissected using a combination of blunt and sharp surgical technique
and a pocket was formed. A single lumen power injectable
portacatheter was then tunneled through the subcutaneous tissues
from the pocket to the dermatotomy and the port reservoir placed
within the subcutaneous pocket.

The venous access site was then serially dilated and a peel away
vascular sheath placed over the wire. The wire was removed and the
port catheter advanced into position under fluoroscopic guidance.
The catheter tip is positioned in the upper right atrium. This was
documented with a spot image. The portacatheter was then tested and
found to flush and aspirate well. The port was flushed with saline
followed by 100 units/mL heparinized saline.

The pocket was then closed in two layers using first subdermal
inverted interrupted absorbable sutures followed by a running
subcuticular suture. The epidermis was then sealed with Dermabond.
The dermatotomy at the venous access site was also sealed with
Dermabond.
IMPRESSION: Successful placement of a right IJ approach Power Port with
ultrasound and fluoroscopic guidance. The catheter is ready for use.

## 2016-11-06 MED ORDER — HYDROMORPHONE HCL 2 MG/ML IJ SOLN
2.0000 mg | Freq: Once | INTRAMUSCULAR | Status: AC
  Administered 2016-11-06: 2 mg via INTRAVENOUS
  Filled 2016-11-06: qty 1

## 2016-11-06 MED ORDER — KETOROLAC TROMETHAMINE 30 MG/ML IJ SOLN
30.0000 mg | Freq: Once | INTRAMUSCULAR | Status: AC
  Administered 2016-11-06: 30 mg via INTRAVENOUS
  Filled 2016-11-06: qty 1

## 2016-11-06 MED ORDER — DICYCLOMINE HCL 10 MG/ML IM SOLN
20.0000 mg | Freq: Once | INTRAMUSCULAR | Status: AC
  Administered 2016-11-06: 20 mg via INTRAMUSCULAR
  Filled 2016-11-06: qty 2

## 2016-11-06 MED ORDER — DIPHENHYDRAMINE HCL 50 MG/ML IJ SOLN
25.0000 mg | Freq: Once | INTRAMUSCULAR | Status: AC
  Administered 2016-11-06: 25 mg via INTRAVENOUS
  Filled 2016-11-06: qty 1

## 2016-11-06 MED ORDER — RIVAROXABAN 20 MG PO TABS
20.0000 mg | ORAL_TABLET | Freq: Every day | ORAL | Status: DC
  Administered 2016-11-06 – 2016-11-10 (×5): 20 mg via ORAL
  Filled 2016-11-06 (×5): qty 1

## 2016-11-06 MED ORDER — SENNOSIDES-DOCUSATE SODIUM 8.6-50 MG PO TABS
1.0000 | ORAL_TABLET | Freq: Two times a day (BID) | ORAL | Status: DC
  Administered 2016-11-07 – 2016-11-10 (×6): 1 via ORAL
  Filled 2016-11-06 (×10): qty 1

## 2016-11-06 MED ORDER — ONDANSETRON HCL 4 MG/2ML IJ SOLN
4.0000 mg | Freq: Once | INTRAMUSCULAR | Status: AC
  Administered 2016-11-06: 4 mg via INTRAVENOUS
  Filled 2016-11-06: qty 2

## 2016-11-06 MED ORDER — MORPHINE SULFATE ER 30 MG PO TBCR
60.0000 mg | EXTENDED_RELEASE_TABLET | Freq: Two times a day (BID) | ORAL | Status: DC
  Administered 2016-11-06 – 2016-11-11 (×10): 60 mg via ORAL
  Filled 2016-11-06 (×10): qty 2

## 2016-11-06 MED ORDER — HYDROMORPHONE HCL 2 MG/ML IJ SOLN
2.0000 mg | INTRAMUSCULAR | Status: AC
  Administered 2016-11-06: 2 mg via INTRAVENOUS
  Filled 2016-11-06: qty 1

## 2016-11-06 MED ORDER — HYDROMORPHONE 1 MG/ML IV SOLN
INTRAVENOUS | Status: DC
  Administered 2016-11-06: 25 mg via INTRAVENOUS
  Administered 2016-11-07: 18 mg via INTRAVENOUS
  Administered 2016-11-07: 03:00:00 via INTRAVENOUS
  Administered 2016-11-07: 11 mg via INTRAVENOUS
  Administered 2016-11-07: 14 mg via INTRAVENOUS
  Filled 2016-11-06 (×2): qty 25

## 2016-11-06 MED ORDER — HYDROMORPHONE HCL 2 MG/ML IJ SOLN: 2.0000 mg | INTRAMUSCULAR | Status: AC

## 2016-11-06 MED ORDER — KETOROLAC TROMETHAMINE 15 MG/ML IJ SOLN
15.0000 mg | Freq: Four times a day (QID) | INTRAMUSCULAR | Status: DC
  Administered 2016-11-06 – 2016-11-07 (×2): 15 mg via INTRAVENOUS
  Filled 2016-11-06 (×2): qty 1

## 2016-11-06 MED ORDER — DEXTROSE-NACL 5-0.45 % IV SOLN
INTRAVENOUS | Status: DC
  Administered 2016-11-06 – 2016-11-10 (×6): via INTRAVENOUS

## 2016-11-06 MED ORDER — HYDROMORPHONE HCL 2 MG/ML IJ SOLN
2.0000 mg | INTRAMUSCULAR | Status: AC
  Filled 2016-11-06: qty 1

## 2016-11-06 MED ORDER — SODIUM CHLORIDE 0.9% FLUSH: 9.0000 mL | INTRAVENOUS | Status: DC | PRN

## 2016-11-06 MED ORDER — POLYETHYLENE GLYCOL 3350 17 G PO PACK: 17.0000 g | PACK | Freq: Every day | ORAL | Status: DC | PRN

## 2016-11-06 MED ORDER — DIPHENHYDRAMINE HCL 25 MG PO CAPS: 25.0000 mg | ORAL_CAPSULE | ORAL | Status: DC | PRN

## 2016-11-06 MED ORDER — VALACYCLOVIR HCL 500 MG PO TABS
1000.0000 mg | ORAL_TABLET | Freq: Every day | ORAL | Status: DC
  Administered 2016-11-06 – 2016-11-11 (×6): 1000 mg via ORAL
  Filled 2016-11-06 (×6): qty 2

## 2016-11-06 MED ORDER — PROMETHAZINE HCL 25 MG/ML IJ SOLN
25.0000 mg | Freq: Once | INTRAMUSCULAR | Status: AC
  Administered 2016-11-06: 25 mg via INTRAVENOUS
  Filled 2016-11-06: qty 1

## 2016-11-06 MED ORDER — SODIUM CHLORIDE 0.9 % IV SOLN
25.0000 mg | INTRAVENOUS | Status: DC | PRN
  Filled 2016-11-06: qty 0.5

## 2016-11-06 MED ORDER — NALOXONE HCL 0.4 MG/ML IJ SOLN: 0.4000 mg | INTRAMUSCULAR | Status: DC | PRN

## 2016-11-06 MED ORDER — SODIUM CHLORIDE 0.9 % IV SOLN
8.0000 mg | Freq: Three times a day (TID) | INTRAVENOUS | Status: DC | PRN
  Filled 2016-11-06: qty 4

## 2016-11-06 MED ORDER — HYDROMORPHONE HCL 2 MG/ML IJ SOLN
2.0000 mg | INTRAMUSCULAR | Status: AC
  Administered 2016-11-06: 2 mg via SUBCUTANEOUS

## 2016-11-06 NOTE — ED Provider Notes (Signed)
[facility] DEPT Provider Note   CSN: [id number] Arrival date & time: 11/06/16  1025  By signing my name below, I, Collene Leyden, attest that this documentation has been prepared under the direction and in the presence of [facility], PA-C . Electronically Signed: Collene Leyden, Scribe. 11/06/16. 11:06 AM.  History   Chief Complaint Chief Complaint  Patient presents with  . Sickle Cell Pain Crisis  . Emesis    HPI Comments: [name] is a 26 y.o. 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 Patient reports eating expired flour prior to her symptoms. Patient denies any sick contacts. Patient states her symptoms are similar to her prior sickle cell pain crisis. Patient rates her pain as a 10/10. LNMP was 10/04/16. Patient reports associated nausea, vomiting x3 episodes, and diarrhea x2 episodes.  The abdominal pain is described as "vomiting pain" and the back pain feels like her typical sickle cell pain.  Patient reports taking her prescribed narcotics with no relief. Patient reports a surgical history of a cholecystectomy. Patient denies any fever, chills, chest pain, shortness of breath, cough, hematochezia, hematemesis, urinary symptoms, vaginal discharge, or vaginal bleeding.   The history is provided by the patient. No language interpreter was used.    Past Medical History:  Diagnosis Date  . Blood transfusion    "lots"  . Blood transfusion without reported diagnosis   . Chronic back pain    "very severe; have knot in my back; from tight muscle; take RX and exercise for it"  . Depression 01/06/2011  . Exertional dyspnea    "sometimes"  . Genital HSV   . GERD (gastroesophageal reflux disease) 02/17/2011  . Migraines 11/08/11   "@ least twice/month"  . 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  . Mood  swings ([place]) 11/08/11   "I go back and forth; real bad"  . Sickle cell anemia ([name])   . Sickle cell anemia with crisis ([place])   . Trichotillomania    h/o    Patient Active Problem List   Diagnosis Date Noted  . Sickle cell pain crisis ([place]) 11/06/2016  . Sickle cell anemia with crisis ([place]) 10/02/2016  . 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 Chronic anticoagulation   . Sickle cell anemia ([place]) 05/19/2016  . History of DVT (deep vein thrombosis) 04/17/2016  . Sickle cell anemia with pain ([place]) 02/16/2016  . Thrombocytosis ([place]) 11/09/2015  . Hyperbilirubinemia 11/09/2015  . Chronic pain 08/04/2015  . Herpes simplex 07/14/2015  . Anemia of chronic disease   . Sickle cell crisis ([place]) 07/31/2013  . Hb-SS disease without crisis ([place]) 02/26/2013  . 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 Sickle cell disease ([place]) 01/08/2009  . 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 CHOLECYSTECTOMY  05/2010  . DILATION AND CURETTAGE OF UTERUS  02/20/11   S/P miscarriage  . IR GENERIC HISTORICAL  12/23/2015   IR FLUORO GUIDE CV LINE RIGHT 12/23/2015 Jacqulynn Cadet, MD [facility]  . IR GENERIC HISTORICAL  12/23/2015   IR US GUIDE VASC ACCESS RIGHT 12/23/2015 Jacqulynn Cadet, MD [facility]    OB History    Gravida Para Term Preterm AB Living   5 2 2  0 3 2   SAB TAB Ectopic Multiple Live Births   3 0 0 0  2      Obstetric Comments   Miscarried in October 2012 at about 7 weeks       Home Medications    Prior to Admission medications   Medication Sig Start Date End Date Taking? Authorizing Provider  hydroxyurea (HYDREA) 500 MG capsule Take 2 capsules (1,000 mg total) by mouth daily. May take with food to minimize GI side effects. Patient taking differently: Take 1,500 mg by mouth daily. May take with food to minimize GI side effects. 04/18/16  Yes Tresa Garter, MD  medroxyPROGESTERone (DEPO-PROVERA) 150 MG/ML injection Inject 1 mL  (150 mg total) into the muscle every 3 (three) months. 09/21/16  Yes Shelly Bombard, MD  morphine (MS CONTIN) 60 MG 12 hr tablet Take 60 mg by mouth every 12 (twelve) hours as needed for pain.    Yes [provider]  oxyCODONE (ROXICODONE) 15 MG immediate release tablet Take 15 mg by mouth every 4 (four) hours as needed for pain.    Yes [provider]  rivaroxaban (XARELTO) 20 MG TABS tablet Take 20 mg by mouth daily with supper.    Yes [provider]  valACYclovir (VALTREX) 1000 MG tablet Take 1 tablet (1,000 mg total) by mouth daily. 05/02/16  Yes [name], MD    Family History Family History  Problem Relation Age of Onset  . Sickle cell trait Mother   . Sickle cell trait Father   . Diabetes Maternal Grandmother   . Diabetes Paternal Grandmother   . Hypertension Paternal Grandmother   . Diabetes Maternal Grandfather     Social History Social History  Substance Use Topics  . Smoking status: Former Smoker    Packs/day: 0.25    Years: 1.00    Types: Cigarettes    Quit date: 03/25/2013  . Smokeless tobacco: Never Used  . Alcohol use Yes     Comment: once a month     Allergies   Food and Latex   Review of Systems Review of Systems  Constitutional: Negative for chills and fever.  Respiratory: Negative for shortness of breath.   Cardiovascular: Negative for chest pain.  Gastrointestinal: Positive for abdominal pain, diarrhea, nausea and vomiting.  Genitourinary: Negative for vaginal bleeding and vaginal discharge.  Musculoskeletal: Positive for back pain.  All other systems reviewed and are negative.    Physical Exam Updated Vital Signs BP 106/69   Pulse 84   Temp 98.2 F (36.8 C)   Resp 17   Wt 91.6 kg (202 lb)   SpO2 100%   BMI 38.17 kg/m   Physical Exam  Constitutional: She appears well-developed and well-nourished. No distress.  Patient is actively vomiting in the room.   HENT:  Head: Normocephalic and  atraumatic.  Neck: Neck supple.  Cardiovascular: Normal rate and regular rhythm.   Pulmonary/Chest: Effort normal and breath sounds normal. No respiratory distress. She has no wheezes. She has no rales.  Abdominal: Soft. She exhibits no distension. There is tenderness. There is no rebound and no guarding.  Diffuse abdominal tenderness.  Musculoskeletal:  No focal tenderness or skin changes of the back.   Neurological: She is alert.  Skin: She is not diaphoretic.  Nursing note and vitals reviewed.    ED Treatments / Results  DIAGNOSTIC STUDIES: Oxygen Saturation is 100% on RA, normal by my interpretation.    COORDINATION OF CARE: 7:55 PM Discussed treatment plan with pt at bedside and pt agreed to plan, which includes IV fluids and pain  medication.   Labs (all labs ordered are listed, but only abnormal results are displayed) Labs Reviewed  COMPREHENSIVE METABOLIC PANEL - Abnormal; Notable for the following:       Result Value   Glucose, Bld 119 (*)    ALT 11 (*)    Total Bilirubin 2.3 (*)    All other components within normal limits  CBC WITH DIFFERENTIAL/PLATELET - Abnormal; Notable for the following:    WBC 16.1 (*)    RBC 2.91 (*)    Hemoglobin 8.3 (*)    HCT 24.5 (*)    RDW 21.4 (*)    Platelets 601 (*)    Neutro Abs 12.2 (*)    Monocytes Absolute 1.5 (*)    All other components within normal limits  RETICULOCYTES - Abnormal; Notable for the following:    Retic Ct Pct 19.9 (*)    RBC. 2.91 (*)    Retic Count, Absolute 579.1 (*)    All other components within normal limits  LIPASE, BLOOD  I-STAT BETA HCG BLOOD, ED ([facility], [facility], [facility] ONLY)    EKG  EKG Interpretation None       Radiology No results found.  Procedures Procedures (including critical care time)  Medications Ordered in ED Medications  dextrose 5 %-0.45 % sodium chloride infusion ( Intravenous Restarted 11/06/16 1947)  HYDROmorphone (DILAUDID) injection 2 mg ( Intravenous Canceled Entry 11/06/16  1930)    Or  HYDROmorphone (DILAUDID) injection 2 mg ( Subcutaneous See Alternative 11/06/16 1930)  morphine (MS CONTIN) 12 hr tablet 60 mg (not administered)  HYDROmorphone (DILAUDID) injection 2 mg (2 mg Intravenous Given 11/06/16 1152)    Or  HYDROmorphone (DILAUDID) injection 2 mg ( Subcutaneous See Alternative 11/06/16 1152)  HYDROmorphone (DILAUDID) injection 2 mg (2 mg Intravenous Given 11/06/16 1336)    Or  HYDROmorphone (DILAUDID) injection 2 mg ( Subcutaneous See Alternative 11/06/16 1336)  HYDROmorphone (DILAUDID) injection 2 mg ( Intravenous See Alternative 11/06/16 1421)    Or  HYDROmorphone (DILAUDID) injection 2 mg (2 mg Subcutaneous Given 11/06/16 1421)  promethazine (PHENERGAN) injection 25 mg (25 mg Intravenous Given 11/06/16 1152)  ketorolac (TORADOL) 30 MG/ML injection 30 mg (30 mg Intravenous Given 11/06/16 1335)  diphenhydrAMINE (BENADRYL) injection 25 mg (25 mg Intravenous Given 11/06/16 1335)  ondansetron (ZOFRAN) injection 4 mg (4 mg Intravenous Given 11/06/16 1421)  diphenhydrAMINE (BENADRYL) injection 25 mg (25 mg Intravenous Given 11/06/16 1653)  dicyclomine (BENTYL) injection 20 mg (20 mg Intramuscular Given 11/06/16 1653)  HYDROmorphone (DILAUDID) injection 2 mg (2 mg Intravenous Given 11/06/16 1653)  HYDROmorphone (DILAUDID) injection 2 mg (2 mg Intravenous Given 11/06/16 1937)  ondansetron (ZOFRAN) injection 4 mg (4 mg Intravenous Given 11/06/16 1937)     Initial Impression / Assessment and Plan / ED Course  I have reviewed the triage vital signs and the nursing notes.  Pertinent labs & imaging results that were available during my care of the patient were reviewed by me and considered in my medical decision making (see chart for details).    Pt with hx sickle cell disease with flare of her sickle cell pain last night when she also developed abdominal cramping, N/V/D.  Labs appear around baseline.  Abdominal symptoms improved while in ED and pt able to tolerate PO and  eat meal.  She reported she was getting no relief from her low back pain and again confirmed this was typical back pain related to her sickle cell crises.  Pt admitted to the hospital for pain control,  Dr Bonner Puna accepting.    Final Clinical Impressions(s) / ED Diagnoses   Final diagnoses:  Sickle cell pain crisis ([name])  Nausea vomiting and diarrhea    New Prescriptions New Prescriptions   No medications on file   I personally performed the services described in this documentation, which was scribed in my presence. The recorded information has been reviewed and is accurate.     Clayton Bibles, [place] 11/06/16 Karl Bales    Charlesetta Shanks, MD 11/21/16 [phone]

## 2016-11-06 NOTE — ED Triage Notes (Signed)
Pt reports she began to have n/v/d last night. Also began to have sickle cell pain at that time. Pain is in lower back and abd.

## 2016-11-06 NOTE — H&P (Signed)
History and Physical   [name] DJS:[id number] DOB: 06-09-1990 DOA: 11/06/2016  Referring MD/NP/PA: Clayton Bibles, PA, EDP PCP: Ricke Hey, MD Outpatient specialists: Sickle cell clinic  Patient coming from: Home  Chief Complaint: Vomiting, abdominal and back pain.  HPI: [name] is a 26 y.o. female with a history of HbSS, frequent pain crises, and DVT on xarelto who presented today for abdominal pain, vomiting and diarrhea in addition to lower back pain.   She reports waking up at 3am this morning with lower, crampy abdominal pain that was waxing/waning prior to having an episode of watery diarrhea and emesis of gastric contents. She denies sick contacts, recent travel, recent abx/medication changes, and suspicious ingestions other than eating some expired flour. She has continued to have nausea and intermittent emesis, 3 episodes, and diarrhea, 2 episodes prompting ED visit where she's had a single episode of diarrhea and emesis. She also describes "aching," severe, nonradiating low back pain consistent with pain crisis. In the ED she has been afebrile with borderline hypotension in no respiratory distress. Labs are at her baseline with WBC 16.1, hgb 8.3 with reticulocytosis. She was given 10mg  IV dilaudid, antiemetics with improvement in GI symptoms/abdominal pain but continues to have intractable back pain, so [facility] is called to admit for sickle cell pain crisis.   Review of Systems: No chest pain, dyspnea, palpitations, cough, blood in emesis or stools, and per HPI. All others reviewed and are negative.   Past Medical History:  Diagnosis Date  . Blood transfusion    "lots"  . Blood transfusion without reported diagnosis   . Chronic back pain    "very severe; have knot in my back; from tight muscle; take RX and exercise for it"  . Depression 01/06/2011  . Exertional dyspnea    "sometimes"  . Genital HSV   . GERD (gastroesophageal reflux disease) 02/17/2011  .  Migraines 11/08/11   "@ least twice/month"  . 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  . Mood swings ([place]) 11/08/11   "I go back and forth; real bad"  . Sickle cell anemia ([name])   . Sickle cell anemia with crisis ([place])   . Trichotillomania    h/o   Past Surgical History:  Procedure Laterality Date  . CHOLECYSTECTOMY  05/2010  . DILATION AND CURETTAGE OF UTERUS  02/20/11   S/P miscarriage  . IR GENERIC HISTORICAL  12/23/2015   IR FLUORO GUIDE CV LINE RIGHT 12/23/2015 Jacqulynn Cadet, MD [facility]  . IR GENERIC HISTORICAL  12/23/2015   IR US GUIDE VASC ACCESS RIGHT 12/23/2015 Jacqulynn Cadet, MD [facility]   - Lives in [place] with two children, each 65 year old, 10 months apart. Denies smoking or illicit drug use.   reports that she quit smoking about 3 years ago. Her smoking use included Cigarettes. She has a 0.25 pack-year smoking history. She has never used smokeless tobacco. She reports that she drinks alcohol. She reports that she does not use drugs. Allergies  Allergen Reactions  . Food Hives and Other (See Comments)    Pt is allergic to carrots.    . Latex Rash   Family History  Problem Relation Age of Onset  . Sickle cell trait Mother   . Sickle cell trait Father   . Diabetes Maternal Grandmother   . Diabetes Paternal Grandmother   . Hypertension Paternal Grandmother   . Diabetes Maternal Grandfather    - Family history otherwise reviewed and not pertinent.  Prior to Admission medications   Medication Sig Start Date End Date Taking? Authorizing Provider  hydroxyurea (HYDREA) 500 MG capsule Take 2 capsules (1,000 mg total) by mouth daily. May take with food to minimize GI side effects. Patient taking differently: Take 1,500 mg by mouth daily. May take with food to minimize GI side effects. 04/18/16  Yes Tresa Garter, MD  medroxyPROGESTERone (DEPO-PROVERA) 150 MG/ML injection Inject 1 mL (150 mg total) into the muscle every 3 (three) months. 09/21/16  Yes  Shelly Bombard, MD  morphine (MS CONTIN) 60 MG 12 hr tablet Take 60 mg by mouth every 12 (twelve) hours as needed for pain.    Yes [provider]  oxyCODONE (ROXICODONE) 15 MG immediate release tablet Take 15 mg by mouth every 4 (four) hours as needed for pain.    Yes [provider]  rivaroxaban (XARELTO) 20 MG TABS tablet Take 20 mg by mouth daily with supper.    Yes [provider]  valACYclovir (VALTREX) 1000 MG tablet Take 1 tablet (1,000 mg total) by mouth daily. 05/02/16  Yes [name], MD    Physical Exam: Vitals:   11/06/16 1700 11/06/16 1800 11/06/16 1900 11/06/16 1937  BP: (!) 118/50 (!) 96/41 (!) 94/52 106/69  Pulse: 96 76 83 84  Resp: (!) 30 15 15 17   Temp:      SpO2: 99% 97% 99% 100%  Weight:       Constitutional: 26 y.o. female in apparent pain laying on left side, grimacing. Eyes: Lids and conjunctivae normal, PERRL ENMT: Mucous membranes are moist. Posterior pharynx clear of any exudate or lesions. Fair dentition.  Neck: normal, supple, no masses, no thyromegaly Respiratory: Non-labored breathing room air without accessory muscle use. Clear breath sounds to auscultation bilaterally Cardiovascular: Regular rate and rhythm, no murmurs, rubs, or gallops. No carotid bruits. No JVD. No LE edema. 2+ pedal pulses. Abdomen: Normoactive bowel sounds. Diffusely tender without rebound or guarding or distention. No hepatosplenomegaly. GU: No indwelling catheter Musculoskeletal: No clubbing / cyanosis. No joint deformity upper and lower extremities. Good ROM, no contractures. Normal muscle tone. Lower back with hyperesthesia but normal ROM and no deformities.  Skin: Warm, dry. No rashes, wounds, no ulcers. No significant lesions noted.  Neurologic: CN II-XII grossly intact. Gait not assessed. Speech normal. No focal deficits in motor strength or sensation in all extremities.  Psychiatric: Alert and oriented x3. Normal judgment and insight.  Mood euthymic with restricted affect.   CBC:  Recent Labs Lab 10/31/16 1150 11/01/16 0237 11/06/16 1157  WBC 10.9* 15.7* 16.1*  NEUTROABS 7.1 12.1* 12.2*  HGB 8.4* 8.8* 8.3*  HCT 25.3* 26.1* 24.5*  MCV 83.5 83.4 84.2  PLT 561* 607* 488*   Basic Metabolic Panel:  Recent Labs Lab 10/31/16 1150 11/01/16 0237 11/06/16 1157  NA 140 138 139  K 4.0 4.2 3.7  CL 107 106 107  CO2 28 23 24   GLUCOSE 110* 102* 119*  BUN 9 8 7   CREATININE 0.53 0.61 0.60  CALCIUM 9.0 9.1 9.0   GFR: Estimated Creatinine Clearance: 111.8 mL/min (by C-G formula based on SCr of 0.6 mg/dL). Liver Function Tests:  Recent Labs Lab 10/31/16 1150 11/06/16 1157  AST 18 20  ALT 14 11*  ALKPHOS 68 73  BILITOT 2.8* 2.3*  PROT 7.6 7.3  ALBUMIN 4.2 4.2    Recent Labs Lab 11/06/16 1157  LIPASE 26   Assessment/Plan Principal Problem:   Sickle cell pain crisis ([place]) Active Problems:  History of DVT (deep vein thrombosis)   Gastroenteritis   HbSS anemia with acute pain crisis:  - Start loading and bolus-only dilaudid PCA in addition to long-acting home MS contin 60mg  q12h - Toradol 15mg  IV q6h  - Fluids running overnight - Hgb and leukocytosis at baseline, no current evidence of ACS - Will hold hydrea for tonight. MCV of 84 noted to be inconsistent with adherence.   Abdominal pain, nausea, vomiting, and diarrhea: In setting of ingestion of expired flour.  - R/o GI pathogens, empiric contact precautions.   History of LUE brachial vein DVT: Dx 04/17/2016.   - Continue xarelto. Now >6 months out from initial acute DVT, would consider discontinuing anticoagulation.   History of genital herpes:  - Continue daily valtrex  DVT prophylaxis: Xarelto  Code Status: Full  Family Communication: None at bedside Disposition Plan: [place] home when pain controlled Consults called: None  Admission status: Observation    Vance Gather, MD [facility] Pager [phone]  If 7PM-7AM,  please contact night-coverage [email or web address] Password [facility] 11/06/2016, 7:57 PM

## 2016-11-06 NOTE — ED Notes (Signed)
Bed: WT88 Expected date:  Expected time:  Means of arrival:  Comments: Tri 1

## 2016-11-07 DIAGNOSIS — R1110 Vomiting, unspecified: Secondary | ICD-10-CM | POA: Diagnosis not present

## 2016-11-07 DIAGNOSIS — Z9114 Patient's other noncompliance with medication regimen: Secondary | ICD-10-CM | POA: Diagnosis not present

## 2016-11-07 DIAGNOSIS — Z91018 Allergy to other foods: Secondary | ICD-10-CM | POA: Diagnosis not present

## 2016-11-07 DIAGNOSIS — F1123 Opioid dependence with withdrawal: Secondary | ICD-10-CM | POA: Diagnosis not present

## 2016-11-07 DIAGNOSIS — R7989 Other specified abnormal findings of blood chemistry: Secondary | ICD-10-CM | POA: Diagnosis present

## 2016-11-07 DIAGNOSIS — Z793 Long term (current) use of hormonal contraceptives: Secondary | ICD-10-CM | POA: Diagnosis not present

## 2016-11-07 DIAGNOSIS — K5289 Other specified noninfective gastroenteritis and colitis: Secondary | ICD-10-CM | POA: Diagnosis present

## 2016-11-07 DIAGNOSIS — Z87891 Personal history of nicotine dependence: Secondary | ICD-10-CM | POA: Diagnosis not present

## 2016-11-07 DIAGNOSIS — Z7901 Long term (current) use of anticoagulants: Secondary | ICD-10-CM | POA: Diagnosis not present

## 2016-11-07 DIAGNOSIS — D473 Essential (hemorrhagic) thrombocythemia: Secondary | ICD-10-CM | POA: Diagnosis not present

## 2016-11-07 DIAGNOSIS — D638 Anemia in other chronic diseases classified elsewhere: Secondary | ICD-10-CM | POA: Diagnosis not present

## 2016-11-07 DIAGNOSIS — G894 Chronic pain syndrome: Secondary | ICD-10-CM | POA: Diagnosis present

## 2016-11-07 DIAGNOSIS — D5700 Hb-SS disease with crisis, unspecified: Secondary | ICD-10-CM | POA: Diagnosis present

## 2016-11-07 DIAGNOSIS — R112 Nausea with vomiting, unspecified: Secondary | ICD-10-CM | POA: Diagnosis not present

## 2016-11-07 DIAGNOSIS — R197 Diarrhea, unspecified: Secondary | ICD-10-CM | POA: Diagnosis not present

## 2016-11-07 DIAGNOSIS — Z86718 Personal history of other venous thrombosis and embolism: Secondary | ICD-10-CM | POA: Diagnosis not present

## 2016-11-07 DIAGNOSIS — Z91040 Latex allergy status: Secondary | ICD-10-CM | POA: Diagnosis not present

## 2016-11-07 DIAGNOSIS — A6000 Herpesviral infection of urogenital system, unspecified: Secondary | ICD-10-CM | POA: Diagnosis present

## 2016-11-07 MED ORDER — HYDROMORPHONE 1 MG/ML IV SOLN
INTRAVENOUS | Status: DC
  Administered 2016-11-07: 8.4 mg via INTRAVENOUS
  Administered 2016-11-07: 25 mg via INTRAVENOUS
  Administered 2016-11-07: 10.78 mg via INTRAVENOUS
  Administered 2016-11-08: 13.8 mg via INTRAVENOUS
  Administered 2016-11-08: 7.8 mg via INTRAVENOUS
  Administered 2016-11-08: 14:00:00 via INTRAVENOUS
  Administered 2016-11-08: 8 mg via INTRAVENOUS
  Administered 2016-11-08: via INTRAVENOUS
  Administered 2016-11-09: 8.4 mg via INTRAVENOUS
  Administered 2016-11-09: 1.2 mg via INTRAVENOUS
  Administered 2016-11-09: 12.59 mg via INTRAVENOUS
  Administered 2016-11-09: 15:00:00 via INTRAVENOUS
  Administered 2016-11-09: 2.4 mg via INTRAVENOUS
  Administered 2016-11-09: 4.89 mg via INTRAVENOUS
  Administered 2016-11-10: 4 mg via INTRAVENOUS
  Administered 2016-11-10: 03:00:00 via INTRAVENOUS
  Administered 2016-11-10: 10.89 mg via INTRAVENOUS
  Administered 2016-11-10: 0 via INTRAVENOUS
  Administered 2016-11-10: 7.8 mg via INTRAVENOUS
  Administered 2016-11-10: 1.5 mg via INTRAVENOUS
  Filled 2016-11-07 (×6): qty 25

## 2016-11-07 MED ORDER — HYDROXYUREA 500 MG PO CAPS
1500.0000 mg | ORAL_CAPSULE | Freq: Every day | ORAL | Status: DC
  Administered 2016-11-07 – 2016-11-08 (×2): 1500 mg via ORAL
  Filled 2016-11-07 (×2): qty 3

## 2016-11-07 MED ORDER — KETOROLAC TROMETHAMINE 15 MG/ML IJ SOLN
30.0000 mg | Freq: Four times a day (QID) | INTRAMUSCULAR | Status: DC
  Administered 2016-11-07 – 2016-11-11 (×16): 30 mg via INTRAVENOUS
  Filled 2016-11-07 (×16): qty 2

## 2016-11-07 NOTE — Progress Notes (Signed)
Patient ID: [name], female   DOB: [birth year], 26 y.o.   MRN: [id number] Opiate tolerant patient with Hb SS admitted this morning with vomiting and diarrhea as well as pain consistent with sickle cell crisis. Since receiving opiates, she has had no further vomiting or diarrhea. She has also had no fevers. Thus I have discontinued stool studies as I do believe that these symptoms were a reflection of opiate withdrawal. I have revised her PCA and adjusted the PCA bolus dose to 0.6 mg. I have also increased Toradol to 30 mg and will continue IVF at current reate.  Since she's had no further emesis,  I have advanced her diet to regular diet and discontinued Zofran by IVPB. I will decrease Zofran to 0.4 mg IV PRN.   Will re-assess pain tomorrow.   Bryten Maher A.

## 2016-11-08 DIAGNOSIS — D638 Anemia in other chronic diseases classified elsewhere: Secondary | ICD-10-CM

## 2016-11-08 DIAGNOSIS — F1123 Opioid dependence with withdrawal: Secondary | ICD-10-CM

## 2016-11-08 DIAGNOSIS — D5700 Hb-SS disease with crisis, unspecified: Principal | ICD-10-CM

## 2016-11-08 DIAGNOSIS — G894 Chronic pain syndrome: Secondary | ICD-10-CM

## 2016-11-08 LAB — CBC WITH DIFFERENTIAL/PLATELET
Basophils Absolute: 0 10*3/uL (ref 0.0–0.1)
Basophils Relative: 0 %
Eosinophils Absolute: 0.4 10*3/uL (ref 0.0–0.7)
Eosinophils Relative: 2 %
HCT: 18.5 % — ABNORMAL LOW (ref 36.0–46.0)
Hemoglobin: 6.3 g/dL — CL (ref 12.0–15.0)
LYMPHS ABS: 3.1 10*3/uL (ref 0.7–4.0)
Lymphocytes Relative: 16 %
MCH: 29.3 pg (ref 26.0–34.0)
MCHC: 34.1 g/dL (ref 30.0–36.0)
MCV: 86 fL (ref 78.0–100.0)
MONO ABS: 2.1 10*3/uL — AB (ref 0.1–1.0)
Monocytes Relative: 11 %
NEUTROS ABS: 13.6 10*3/uL — AB (ref 1.7–7.7)
Neutrophils Relative %: 71 %
Platelets: 456 10*3/uL — ABNORMAL HIGH (ref 150–400)
RBC: 2.15 MIL/uL — ABNORMAL LOW (ref 3.87–5.11)
RDW: 25.4 % — ABNORMAL HIGH (ref 11.5–15.5)
WBC: 19.2 10*3/uL — ABNORMAL HIGH (ref 4.0–10.5)
nRBC: 7 /100 WBC — ABNORMAL HIGH

## 2016-11-08 LAB — RETICULOCYTES
RBC.: 2.15 MIL/uL — ABNORMAL LOW (ref 3.87–5.11)
Retic Count, Absolute: 468.7 10*3/uL — ABNORMAL HIGH (ref 19.0–186.0)
Retic Ct Pct: 21.8 % — ABNORMAL HIGH (ref 0.4–3.1)

## 2016-11-08 MED ORDER — DIPHENHYDRAMINE HCL 25 MG PO CAPS: 25.0000 mg | ORAL_CAPSULE | ORAL | Status: DC | PRN

## 2016-11-08 MED ORDER — HYDROXYUREA 500 MG PO CAPS
1000.0000 mg | ORAL_CAPSULE | Freq: Every day | ORAL | Status: DC
  Administered 2016-11-09 – 2016-11-11 (×3): 1000 mg via ORAL
  Filled 2016-11-08 (×3): qty 2

## 2016-11-08 MED ORDER — DIPHENHYDRAMINE HCL 25 MG PO CAPS: 25.0000 mg | ORAL_CAPSULE | Freq: Four times a day (QID) | ORAL | Status: DC | PRN

## 2016-11-08 NOTE — Progress Notes (Signed)
SICKLE CELL SERVICE PROGRESS NOTE  [name] PXT:[id number] DOB: 11-Feb-1991 DOA: 11/06/2016 PCP: Ricke Hey, MD  Assessment/Plan: Principal Problem:   Sickle cell pain crisis ([place]) Active Problems:   History of DVT (deep vein thrombosis)   Gastroenteritis  1. Hb SS with crisis: Continue PCA at current dose. Continue Toradol and decrease IVF.  2. Vomiting and Diarrhea: Resolved. Pt has had a pattern of vomiting and diarrhea which resolves after 1st dose of IV Dilaudid. This supports a syndrome of withdrawal aborted by the implicated substance- in this case Opiates.  3. Anemia of Chronic Disease: Pt has had a decrease in Hb by 2 grams. This is likely a consequence of hemodilution from IVF and effects of Hydrea in a dose to which patient is unacostomed. Will decrease dose to 1000 mg.  4. Chronic Pain Syndrome: Continue MS Contin.  5. Non Adherence to Medications: Pt is prescribed Hydrea however MCV is incongruent with and patient admits that she is not adherent to frequency of prescribed regimen.  6. Gait abnormality: Due to pain. 7. Chronic Anticoagulation: Continue Xarelto.  Code Status: Full Code Family Communication: N/A Disposition Plan: Not yet ready for discharge  [place].  Pager [phone]. If 7PM-7AM, please contact night-coverage.  11/08/2016, 1:52 PM  LOS: 1 day   Interim History: Pt reports pain in BLE's and groin. She has used 40.2 mg of Dilaudid with 73/67: Demands/deliveries in the last 24 hours.   Consultants:  None  Procedures:  None  Antibiotics:  None    Objective: Vitals:   11/08/16 0541 11/08/16 0825 11/08/16 1005 11/08/16 1214  BP:   114/71   Pulse:   (!) 106   Resp: 17 10 10 10   Temp:   98.5 F (36.9 C)   TempSrc:   Oral   SpO2: 97% 98% 95% 96%  Weight:      Height:       Weight change: 4.374 kg (9 lb 10.3 oz)  Intake/Output Summary (Last 24 hours) at 11/08/16 1352 Last data filed at 11/08/16 1143  Gross per 24  hour  Intake          1416.25 ml  Output             2700 ml  Net         -1283.75 ml      Physical Exam General: Alert, awake, oriented x3, in moderate distress.  HEENT: [place]/AT PEERL, EOMI, anicteric. Neck: Trachea midline,  no masses, no thyromegal,y no JVD, no carotid bruit OROPHARYNX:  Moist, No exudate/ erythema/lesions.  Heart: Regular rate and rhythm, without murmurs, rubs, gallops, PMI non-displaced, no heaves or thrills on palpation.  Lungs: Clear to auscultation, no wheezing or rhonchi noted. No increased vocal fremitus resonant to percussion  Abdomen: Soft, nontender, nondistended, positive bowel sounds, no masses no hepatosplenomegaly noted..  Neuro: No focal neurological deficits noted cranial nerves II through XII grossly intact. Strength at baseline in bilateral upper and lower extremities. Musculoskeletal: No warmth swelling or erythema around joints, no spinal tenderness noted. Psychiatric: Patient alert and oriented x3, good insight and cognition, good recent to remote recall.    Data Reviewed: Basic Metabolic Panel:  Recent Labs Lab 11/06/16 1157  NA 139  K 3.7  CL 107  CO2 24  GLUCOSE 119*  BUN 7  CREATININE 0.60  CALCIUM 9.0   Liver Function Tests:  Recent Labs Lab 11/06/16 1157  AST 20  ALT 11*  ALKPHOS 73  BILITOT 2.3*  PROT  7.3  ALBUMIN 4.2    Recent Labs Lab 11/06/16 1157  LIPASE 26   No results for input(s): AMMONIA in the last 168 hours. CBC:  Recent Labs Lab 11/06/16 1157 11/08/16 1008  WBC 16.1* 19.2*  NEUTROABS 12.2* 13.6*  HGB 8.3* 6.3*  HCT 24.5* 18.5*  MCV 84.2 86.0  PLT 601* 456*   Cardiac Enzymes: No results for input(s): CKTOTAL, CKMB, CKMBINDEX, TROPONINI in the last 168 hours. BNP (last 3 results) No results for input(s): BNP in the last 8760 hours.  ProBNP (last 3 results) No results for input(s): PROBNP in the last 8760 hours.  CBG: No results for input(s): GLUCAP in the last 168 hours.  No results  found for this or any previous visit (from the past 240 hour(s)).   Studies: No results found.  Scheduled Meds: . HYDROmorphone   Intravenous Q4H  . hydroxyurea  1,500 mg Oral Daily  . ketorolac  30 mg Intravenous Q6H  . morphine  60 mg Oral Q12H  . rivaroxaban  20 mg Oral Q supper  . senna-docusate  1 tablet Oral BID  . valACYclovir  1,000 mg Oral Daily   Continuous Infusions: . dextrose 5 % and 0.45% NaCl 125 mL/hr at 11/08/16 0543    Principal Problem:   Sickle cell pain crisis ([place]) Active Problems:   History of DVT (deep vein thrombosis)   Gastroenteritis     In excess of 35 minutes spent during this visit. Greater than 50% involved face to face contact with the patient for assessment, counseling and coordination of care.

## 2016-11-08 NOTE — Progress Notes (Signed)
CRITICAL VALUE ALERT  Critical Value:  Hgb 6.3  Date & Time Notied:  11/08/2016  1118  Provider Notified: [name]  Orders Received/Actions taken: No new orders

## 2016-11-08 NOTE — Progress Notes (Signed)
On bedside report patients IV pump was beeping occluded.  Night RN had to unclamp the clamp closest to the [facility].  Went in to assess patient and clamp closest to [facility] was clamped off again.  Educated patient that she needs to leave the clamps unclamped on her PAC for safety as well as this inhibits her IVFs and pain medication from getting to her.  Will continue to monitor.

## 2016-11-09 DIAGNOSIS — Z7901 Long term (current) use of anticoagulants: Secondary | ICD-10-CM

## 2016-11-09 LAB — CBC
HCT: 19.3 % — ABNORMAL LOW (ref 36.0–46.0)
Hemoglobin: 6.6 g/dL — CL (ref 12.0–15.0)
MCH: 29.6 pg (ref 26.0–34.0)
MCHC: 34.2 g/dL (ref 30.0–36.0)
MCV: 86.5 fL (ref 78.0–100.0)
Platelets: 491 10*3/uL — ABNORMAL HIGH (ref 150–400)
RBC: 2.23 MIL/uL — ABNORMAL LOW (ref 3.87–5.11)
RDW: 25 % — ABNORMAL HIGH (ref 11.5–15.5)
WBC: 18.3 10*3/uL — ABNORMAL HIGH (ref 4.0–10.5)

## 2016-11-09 LAB — DIFFERENTIAL
Band Neutrophils: 0 %
Basophils Absolute: 0 10*3/uL (ref 0.0–0.1)
Basophils Relative: 0 %
Blasts: 0 %
Eosinophils Absolute: 0.2 10*3/uL (ref 0.0–0.7)
Eosinophils Relative: 1 %
Lymphocytes Relative: 13 %
Lymphs Abs: 2.4 10*3/uL (ref 0.7–4.0)
Metamyelocytes Relative: 0 %
Monocytes Absolute: 0.5 10*3/uL (ref 0.1–1.0)
Monocytes Relative: 3 %
Myelocytes: 0 %
Neutro Abs: 15.2 10*3/uL — ABNORMAL HIGH (ref 1.7–7.7)
Neutrophils Relative %: 83 %
Other: 0 %
Promyelocytes Absolute: 0 %
nRBC: 4 /100 WBC — ABNORMAL HIGH

## 2016-11-09 LAB — BASIC METABOLIC PANEL
Anion gap: 6 (ref 5–15)
BUN: 7 mg/dL (ref 6–20)
CO2: 27 mmol/L (ref 22–32)
Calcium: 8.8 mg/dL — ABNORMAL LOW (ref 8.9–10.3)
Chloride: 105 mmol/L (ref 101–111)
Creatinine, Ser: 0.56 mg/dL (ref 0.44–1.00)
GFR calc Af Amer: >60 mL/min (ref >60)
GFR calc non Af Amer: >60 mL/min (ref >60)
Glucose, Bld: 113 mg/dL — ABNORMAL HIGH (ref 65–99)
Potassium: 3.6 mmol/L (ref 3.5–5.1)
Sodium: 138 mmol/L (ref 135–145)

## 2016-11-09 LAB — RETICULOCYTES
RBC.: 2.27 MIL/uL — AB (ref 3.87–5.11)
Retic Ct Pct: >23 % — ABNORMAL HIGH (ref 0.4–3.1)

## 2016-11-09 MED ORDER — HYDROMORPHONE HCL 1 MG/ML IJ SOLN
1.0000 mg | Freq: Once | INTRAMUSCULAR | Status: AC
  Administered 2016-11-09: 1 mg via INTRAVENOUS
  Filled 2016-11-09: qty 1

## 2016-11-09 MED ORDER — MUSCLE RUB 10-15 % EX CREA
TOPICAL_CREAM | CUTANEOUS | Status: DC | PRN
  Administered 2016-11-10: 11:00:00 via TOPICAL
  Filled 2016-11-09: qty 85

## 2016-11-09 MED ORDER — LIDOCAINE 5 % EX PTCH
2.0000 | MEDICATED_PATCH | CUTANEOUS | Status: DC
  Administered 2016-11-09 – 2016-11-10 (×2): 2 via TRANSDERMAL
  Filled 2016-11-09 (×3): qty 2

## 2016-11-09 NOTE — Progress Notes (Signed)
On call provider does not wish to provide breakthrough dose of pain medication at this time. Discussed with patient the importance of using the PCA at regular allotted intervals while awake so as to prevent pain from becoming uncontrolled.

## 2016-11-09 NOTE — Progress Notes (Signed)
Patient having increased pain, rating it 10/10, and requesting dose of Dilaudid for breakthrough pain. On call paged.

## 2016-11-09 NOTE — Progress Notes (Signed)
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 Hb SS with Crisis: Continue PCA at current dose. Continue Toradol and decrease IVF to [facility]. Also treat adjunctively with Transdermal Lidocaine and muscle rub. 2. Chronic Anticoagulation: Continue Xarelto 3. Anemia of Chronic Disease: Hb decrease but reticulocytosis adequate for degree of anemia. RPI 3.9 % 4. Chronic Pain Syndrome: Continue MS Contin.   Code Status: Full Code Family Communication: N/A Disposition Plan: Not yet ready for discharge  [place].  Pager [phone]. If 7PM-7AM, please contact night-coverage.  11/09/2016, 3:02 PM  LOS: 2 days   Interim History: Pt reports that she slept for several hours last night and that upon awakening this morning her pain was 8/10. She has used her PCA maximally and the pain is still at 8/10 and localized to the right thigh.   Consultants:  None  Procedures:  None  Antibiotics:  None    Objective: Vitals:   11/09/16 0528 11/09/16 0924 11/09/16 0948 11/09/16 1150  BP: 114/70 114/68    Pulse: 90 89    Resp: 11 11 11 12   Temp: 97.4 F (36.3 C) 99.5 F (37.5 C)    TempSrc: Oral Oral    SpO2: 94% 98% 98% 96%  Weight: 96.4 kg (212 lb 8.4 oz)     Height:       Weight change: 0.4 kg (14.1 oz)  Intake/Output Summary (Last 24 hours) at 11/09/16 1502 Last data filed at 11/09/16 1457  Gross per 24 hour  Intake             1080 ml  Output             1400 ml  Net             -320 ml     Physical Exam General: Alert, awake, oriented x3, in mild t moderate distress due to pain.  HEENT: [place]/AT PEERL, EOMI, anicteric Neck: Trachea midline,  no masses, no thyromegal,y no JVD, no carotid bruit OROPHARYNX:  Moist, No exudate/ erythema/lesions.   Heart: Regular rate and rhythm, without murmurs, rubs, gallops, PMI non-displaced, no heaves or thrills on palpation.  Lungs: Clear to auscultation, no wheezing or rhonchi noted. No increased vocal fremitus resonant to percussion  Abdomen: Soft, nontender, nondistended, positive bowel sounds, no masses no hepatosplenomegaly noted..  Neuro: No focal neurological deficits noted cranial nerves II through XII grossly intact.  Strength at baseline  in bilateral upper and lower extremities but gait limited by pain  Musculoskeletal: No warmt swelling or erythema around joints, no spinal tenderness noted. Psychiatric: Patient alert and oriented x3, good insight and cognition, good recent to remote recall.   Data Reviewed: Basic Metabolic Panel:  Recent Labs Lab 11/06/16 1157 11/09/16 0500  NA 139 138  K 3.7 3.6  CL 107 105  CO2 24 27  GLUCOSE 119* 113*  BUN 7 7  CREATININE 0.60 0.56  CALCIUM 9.0 8.8*   Liver Function Tests:  Recent Labs Lab 11/06/16 1157  AST 20  ALT 11*  ALKPHOS 73  BILITOT 2.3*  PROT 7.3  ALBUMIN 4.2    Recent Labs Lab 11/06/16 1157  LIPASE 26   No results for input(s): AMMONIA in the last 168 hours. CBC:  Recent Labs Lab 11/06/16 1157 11/08/16 1008 11/09/16 0500  WBC 16.1* 19.2* 18.3*  NEUTROABS 12.2* 13.6* 15.2*  HGB 8.3* 6.3* 6.6*  HCT 24.5* 18.5* 19.3*  MCV 84.2 86.0 86.5  PLT 601* 456* 491*   Cardiac Enzymes: No results for input(s): CKTOTAL, CKMB, CKMBINDEX, TROPONINI in the last 168 hours. BNP (last 3 results) No results for input(s): BNP in the last 8760 hours.  ProBNP (last 3 results) No results for input(s): PROBNP in the last 8760 hours.  CBG: No results for input(s): GLUCAP in the last 168 hours.  No results found for this or any previous visit (from the past 240 hour(s)).   Studies: No results found.  Scheduled Meds: . HYDROmorphone   Intravenous Q4H  .  HYDROmorphone (DILAUDID) injection  1 mg Intravenous Once  .  hydroxyurea  1,000 mg Oral Daily  . ketorolac  30 mg Intravenous Q6H  . morphine  60 mg Oral Q12H  . rivaroxaban  20 mg Oral Q supper  . senna-docusate  1 tablet Oral BID  . valACYclovir  1,000 mg Oral Daily   Continuous Infusions: . 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 Greater than 50% involved face to face contact with the patient for assessment, counseling and coordination of care.

## 2016-11-10 MED ORDER — HYDROMORPHONE 1 MG/ML IV SOLN
INTRAVENOUS | Status: DC
  Administered 2016-11-10: 19:00:00 via INTRAVENOUS
  Administered 2016-11-11: 5.38 mg via INTRAVENOUS
  Administered 2016-11-11: 6.5 mg via INTRAVENOUS
  Administered 2016-11-11: 7 mg via INTRAVENOUS
  Administered 2016-11-11: 4 mg via INTRAVENOUS
  Filled 2016-11-10: qty 25

## 2016-11-10 MED ORDER — OXYCODONE-ACETAMINOPHEN 5-325 MG PO TABS: 1.0000 | ORAL_TABLET | Freq: Once | ORAL | Status: DC

## 2016-11-10 MED ORDER — OXYCODONE HCL 5 MG PO TABS
15.0000 mg | ORAL_TABLET | ORAL | Status: DC
  Administered 2016-11-10 – 2016-11-11 (×5): 15 mg via ORAL
  Filled 2016-11-10 (×5): qty 3

## 2016-11-10 NOTE — Progress Notes (Signed)
SICKLE CELL SERVICE PROGRESS NOTE  [name] JGG:[id number] DOB: [birth year] DOA: 11/06/2016 PCP: Ricke Hey, MD  Assessment/Plan: Principal Problem:   Sickle cell pain crisis ([place]) Active Problems:   History of DVT (deep vein thrombosis)   Gastroenteritis  1. HBSS with crisis: Schedule oxycodone starting today. Wean the PCA, continue Toradol and transdermal lidocaine patches. Anticipate discharge home tomorrow. 2. Anemia of chronic disease: Hemoglobin from yesterday 6.6 with robust reticulocytosis. We'll recheck labs tomorrow prior to discharge. Currently the patient is asymptomatic from her degree of anemia. No indication for transfusion at this time. 3. Chronic anticoagulation: Continue Xarelto 4. Chronic pain syndrome: Continue MS Contin.   Code Status: Full Code Family Communication: N/A Disposition Plan: Anticipate discharge home tomorrow.   Hawley Pavia A.  Pager [phone]. If 7PM-7AM, please contact night-coverage.  11/10/2016, 5:04 PM  LOS: 3 days   Interim History: Pt reports pain in right thigh has decreased to 6-7/10 and she feels that the lidocaine patch is helpful. She has used 40.4 mg of Dilaudid with 68/68:Demands/deliveries in the last 24 hours.  Consultants:  None  Procedures:  None  Antibiotics:  None    Objective: Vitals:   11/10/16 1100 11/10/16 1234 11/10/16 1331 11/10/16 1632  BP: 112/63  (!) 104/57   Pulse: 79  100   Resp:  12 13 (!) 22  Temp:   98.9 F (37.2 C)   TempSrc:   Oral   SpO2:  98% 94% 98%  Weight:      Height:       Weight change: 0.3 kg (10.6 oz)  Intake/Output Summary (Last 24 hours) at 11/10/16 1704 Last data filed at 11/10/16 1640  Gross per 24 hour  Intake              880 ml  Output             1000 ml  Net             -120 ml     Physical Exam General: Alert, awake, oriented x3, in no acute distress. Well appearing HEENT: [place]/AT PEERL, EOMI, anicteric Neck: Trachea midline,  no masses, no  thyromegal,y no JVD, no carotid bruit OROPHARYNX:  Moist, No exudate/ erythema/lesions.  Heart: Regular rate and rhythm, without murmurs, rubs, gallops, PMI non-displaced, no heaves or thrills on palpation.  Lungs: Clear to auscultation, no wheezing or rhonchi noted. No increased vocal fremitus resonant to percussion  Abdomen: Soft, nontender, nondistended, positive bowel sounds, no masses no hepatosplenomegaly noted..  Neuro: No focal neurological deficits noted cranial nerves II through XII grossly intact. Strength at baseline in bilateral upper and lower extremities. Musculoskeletal: No warmth swelling or erythema around joints, no spinal tenderness noted. Psychiatric: Patient alert and oriented x3, good insight and cognition, good recent to remote recall.     Data Reviewed: Basic Metabolic Panel:  Recent Labs Lab 11/06/16 1157 11/09/16 0500  NA 139 138  K 3.7 3.6  CL 107 105  CO2 24 27  GLUCOSE 119* 113*  BUN 7 7  CREATININE 0.60 0.56  CALCIUM 9.0 8.8*   Liver Function Tests:  Recent Labs Lab 11/06/16 1157  AST 20  ALT 11*  ALKPHOS 73  BILITOT 2.3*  PROT 7.3  ALBUMIN 4.2    Recent Labs Lab 11/06/16 1157  LIPASE 26   No results for input(s): AMMONIA in the last 168 hours. CBC:  Recent Labs Lab 11/06/16 1157 11/08/16 1008 11/09/16 0500  WBC 16.1* 19.2* 18.3*  NEUTROABS  12.2* 13.6* 15.2*  HGB 8.3* 6.3* 6.6*  HCT 24.5* 18.5* 19.3*  MCV 84.2 86.0 86.5  PLT 601* 456* 491*   Cardiac Enzymes: No results for input(s): CKTOTAL, CKMB, CKMBINDEX, TROPONINI in the last 168 hours. BNP (last 3 results) No results for input(s): BNP in the last 8760 hours.  ProBNP (last 3 results) No results for input(s): PROBNP in the last 8760 hours.  CBG: No results for input(s): GLUCAP in the last 168 hours.  No results found for this or any previous visit (from the past 240 hour(s)).   Studies: No results found.  Scheduled Meds: . HYDROmorphone   Intravenous Q4H   . hydroxyurea  1,000 mg Oral Daily  . ketorolac  30 mg Intravenous Q6H  . lidocaine  2 patch Transdermal Q24H  . morphine  60 mg Oral Q12H  . oxyCODONE  15 mg Oral Q4H  . rivaroxaban  20 mg Oral Q supper  . senna-docusate  1 tablet Oral BID  . valACYclovir  1,000 mg Oral Daily   Continuous Infusions: . dextrose 5 % and 0.45% NaCl 10 mL/hr at 11/09/16 1516    Principal Problem:   Sickle cell pain crisis ([employer]) Active Problems:   History of DVT (deep vein thrombosis)   Gastroenteritis    In excess of 25 minutes spent during this visit. Greater than 50% involved face to face contact with the patient for assessment, counseling and coordination of care.

## 2016-11-11 ENCOUNTER — Encounter (HOSPITAL_COMMUNITY): Payer: Self-pay | Admitting: Internal Medicine

## 2016-11-11 DIAGNOSIS — D473 Essential (hemorrhagic) thrombocythemia: Secondary | ICD-10-CM

## 2016-11-11 DIAGNOSIS — R1110 Vomiting, unspecified: Secondary | ICD-10-CM

## 2016-11-11 LAB — CBC WITH DIFFERENTIAL/PLATELET
Basophils Absolute: 0 10*3/uL (ref 0.0–0.1)
Basophils Relative: 0 %
EOS PCT: 4 %
Eosinophils Absolute: 0.6 10*3/uL (ref 0.0–0.7)
HEMATOCRIT: 18.4 % — AB (ref 36.0–46.0)
Hemoglobin: 6.3 g/dL — CL (ref 12.0–15.0)
LYMPHS ABS: 3.3 10*3/uL (ref 0.7–4.0)
Lymphocytes Relative: 23 %
MCH: 30.3 pg (ref 26.0–34.0)
MCHC: 34.2 g/dL (ref 30.0–36.0)
MCV: 88.5 fL (ref 78.0–100.0)
MONO ABS: 1.4 10*3/uL — AB (ref 0.1–1.0)
Monocytes Relative: 10 %
NEUTROS ABS: 9 10*3/uL — AB (ref 1.7–7.7)
Neutrophils Relative %: 63 %
PLATELETS: 488 10*3/uL — AB (ref 150–400)
RBC: 2.08 MIL/uL — AB (ref 3.87–5.11)
RDW: 25.3 % — AB (ref 11.5–15.5)
WBC: 14.3 10*3/uL — AB (ref 4.0–10.5)

## 2016-11-11 LAB — RETICULOCYTES
RBC.: 2.08 MIL/uL — ABNORMAL LOW (ref 3.87–5.11)
Retic Ct Pct: >23 % — ABNORMAL HIGH (ref 0.4–3.1)

## 2016-11-11 MED ORDER — HEPARIN SOD (PORK) LOCK FLUSH 100 UNIT/ML IV SOLN
500.0000 [IU] | Freq: Once | INTRAVENOUS | Status: AC
  Administered 2016-11-11: 500 [IU]

## 2016-11-11 MED ORDER — LIDOCAINE 5 % EX PTCH: 1.0000 | MEDICATED_PATCH | CUTANEOUS | 0 refills | Status: DC

## 2016-11-11 MED ORDER — MUSCLE RUB 10-15 % EX CREA: 1.0000 "application " | TOPICAL_CREAM | CUTANEOUS | 0 refills | Status: DC | PRN

## 2016-11-11 NOTE — Discharge Summary (Signed)
[name] MRN: [id number] DOB/AGE: 1990/10/11 25 y.o.  Admit date: 11/06/2016 Discharge date: 11/11/2016  Primary Care Physician:  Ricke Hey, MD   Discharge Diagnoses:   Patient Active Problem List   Diagnosis Date Noted  . Major depression, chronic 01/06/2011    Priority: High  . Chronic anticoagulation   . Sickle cell anemia ([place]) 05/19/2016  . History of DVT (deep vein thrombosis) 04/17/2016  . Sickle cell anemia with pain ([place]) 02/16/2016  . Thrombocytosis ([place]) 11/09/2015  . Hyperbilirubinemia 11/09/2015  . Chronic pain 08/04/2015  . Herpes simplex 07/14/2015  . Anemia of chronic disease   . 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 Hb-SS disease without crisis ([place]) [birth year]    DISCHARGE MEDICATION: Allergies as of 11/11/2016      Reactions   Food Hives, Other (See Comments)   Pt is allergic to carrots.     Latex Rash      Medication List    TAKE these medications   hydroxyurea 500 MG capsule Commonly known as:  HYDREA Take 2 capsules (1,000 mg total) by mouth daily. May take with food to minimize GI side effects. What changed:  how much to take  additional instructions   lidocaine 5 % Commonly known as:  LIDODERM Place 1 patch onto the skin daily. Remove & Discard patch within 12 hours or as directed by MD   medroxyPROGESTERone 150 MG/ML injection Commonly known as:  DEPO-PROVERA Inject 1 mL (150 mg total) into the muscle every 3 (three) months.   morphine 60 MG 12 hr tablet Commonly known as:  MS CONTIN Take 60 mg by mouth every 12 (twelve) hours as needed for pain.   MUSCLE RUB 10-15 % Crea Apply 1 application topically as needed for muscle pain.   oxyCODONE 15 MG immediate release tablet Commonly known as:  ROXICODONE Take 15 mg by mouth every 4 (four) hours as needed for pain.   rivaroxaban 20 MG Tabs tablet Commonly known as:  XARELTO Take 20 mg by mouth daily with supper.   valACYclovir 1000 MG tablet Commonly known as:   VALTREX Take 1 tablet (1,000 mg total) by mouth daily.         Consults:    SIGNIFICANT DIAGNOSTIC STUDIES:  No results found.     No results found for this or any previous visit (from the past 240 hour(s)).  BRIEF ADMITTING H & P: [name] is a 26 y.o. female with a history of HbSS, frequent pain crises, and DVT on xarelto who presented today for abdominal pain, vomiting and diarrhea in addition to lower back pain.   She reports waking up at 3am this morning with lower, crampy abdominal pain that was waxing/waning prior to having an episode of watery diarrhea and emesis of gastric contents. She denies sick contacts, recent travel, recent abx/medication changes, and suspicious ingestions other than eating some expired flour. She has continued to have nausea and intermittent emesis, 3 episodes, and diarrhea, 2 episodes prompting ED visit where she's had a single episode of diarrhea and emesis. She also describes "aching," severe, nonradiating low back pain consistent with pain crisis. In the ED she has been afebrile with borderline hypotension in no respiratory distress. Labs are at her baseline with WBC 16.1, hgb 8.3 with reticulocytosis. She was given 10mg  IV dilaudid, antiemetics with improvement in GI symptoms/abdominal pain but continues to have intractable back pain, so [facility] is called to admit for sickle cell pain crisis.    Hospital Course:  Present  on Admission: . (Resolved) Sickle cell pain crisis ([place]) . (Resolved) Vomiting and diarrhea  1. Hb SS with Crisis: Pt was treated with Dilaudid via PCA, Toradol and IVF. Pain was persistent and Lidocaine transdermal patch was applied which was very effective in controlling pain.  2. Vomiting and Diarrhea: Resolved after 1st dose of IV Dilaudid.  3. Leukocytosis: Related to crisis. No evidence of infection.  4. Anemia of Chronic Disease: Pt experienced a decrease in Hb down to 6.3 g/dL. However she had a robust  reticulocytosis and Hydrea was continued.  5. Chronic Pain Syndrome: MS Contin was continued and  6. Chronic Thrombocytosis: This would likely resolve if patient is becomes adherent to Hydrea use. If this persists she will likely require ASA as platelets are implicated in pathophysiology of vaso-occlusive crises.   7. Chronic Anticoagulation: Pt was continued on Xarelto without any changes in dose.    Disposition and Follow-up: Pt was discharged home in good condition. She is to follow up with her Physician as scheduled.   Discharge Instructions    Activity as tolerated - No restrictions    Complete by:  As directed    Diet general    Complete by:  As directed       DISCHARGE EXAM:  General: Alert, awake, oriented x3, in no apparent distress.  HEENT: [place]/AT PEERL, EOMI, anicteric Neck: Trachea midline, no masses, no thyromegal,y no JVD, no carotid bruit OROPHARYNX: Moist, No exudate/ erythema/lesions.  Heart: Regular rate and rhythm, without murmurs, rubs, gallops or S3. PMI non-displaced. Exam reveals no decreased pulses. Pulmonary/Chest: Normal effort. Breath sounds normal. No. Apnea. Clear to auscultation,no stridor,  no wheezing and no rhonchi noted. No respiratory distress and no tenderness noted. Abdomen: Soft, nontender, nondistended, normal bowel sounds, no masses no hepatosplenomegaly noted. No fluid wave and no ascites. There is no guarding or rebound. Neuro: Alert and oriented to person, place and time. Normal motor skills, Displays no atrophy or tremors and exhibits normal muscle tone.  No focal neurological deficits noted cranial nerves II through XII grossly intact. No sensory deficit noted. Strength at baseline in bilateral upper and lower extremities. Gait normal. Musculoskeletal: No warmth swelling or erythema around joints, no spinal tenderness noted. Psychiatric: Patient alert and oriented x3, good insight and cognition, good recent to remote recall. Mood, affect and  judgement normal Skin: Skin is warm and dry. No bruising, no ecchymosis and no rash noted. Pt is not diaphoretic. No erythema. No pallor    Blood pressure (!) 105/58, pulse 86, temperature 99.6 F (37.6 C), temperature source Oral, resp. rate 14, height 5\' 1"  (1.549 m), weight 96.7 kg (213 lb 3 oz), SpO2 93 %, not currently breastfeeding.   Recent Labs  11/09/16 0500  NA 138  K 3.6  CL 105  CO2 27  GLUCOSE 113*  BUN 7  CREATININE 0.56  CALCIUM 8.8*   No results for input(s): AST, ALT, ALKPHOS, BILITOT, PROT, ALBUMIN in the last 72 hours. No results for input(s): LIPASE, AMYLASE in the last 72 hours.  Recent Labs  11/09/16 0500 11/11/16 0500  WBC 18.3* 14.3*  NEUTROABS 15.2* 9.0*  HGB 6.6* 6.3*  HCT 19.3* 18.4*  MCV 86.5 88.5  PLT 491* 488*     Total time spent including face to face and decision making was greater than 30 minutes  Signed: Merlyn Bollen A. 11/11/2016, 12:36 PM

## 2016-11-11 NOTE — Care Management Important Message (Signed)
Important Message  Patient Details  Name: ARIONA DESCHENE MRN: [id number] Date of Birth: Mar 02, 1991   Medicare Important Message Given:  Yes    Kerin Salen 11/11/2016, 9:27 AMImportant Message  Patient Details  Name: SHAANA ACOCELLA MRN: [id number] Date of Birth: 1990-06-16   Medicare Important Message Given:  Yes    Kerin Salen 11/11/2016, 9:27 AM

## 2016-11-14 DIAGNOSIS — Z79891 Long term (current) use of opiate analgesic: Secondary | ICD-10-CM | POA: Diagnosis not present

## 2016-11-17 IMAGING — CR DG CHEST 2V
2 series · 2 of 2 positions shown · non-contrast
Comparison: 12/08/2015

CLINICAL DATA: Cough, back pain, history of sickle cell pain

EXAM:
CHEST  2 VIEW

[w chest pa]
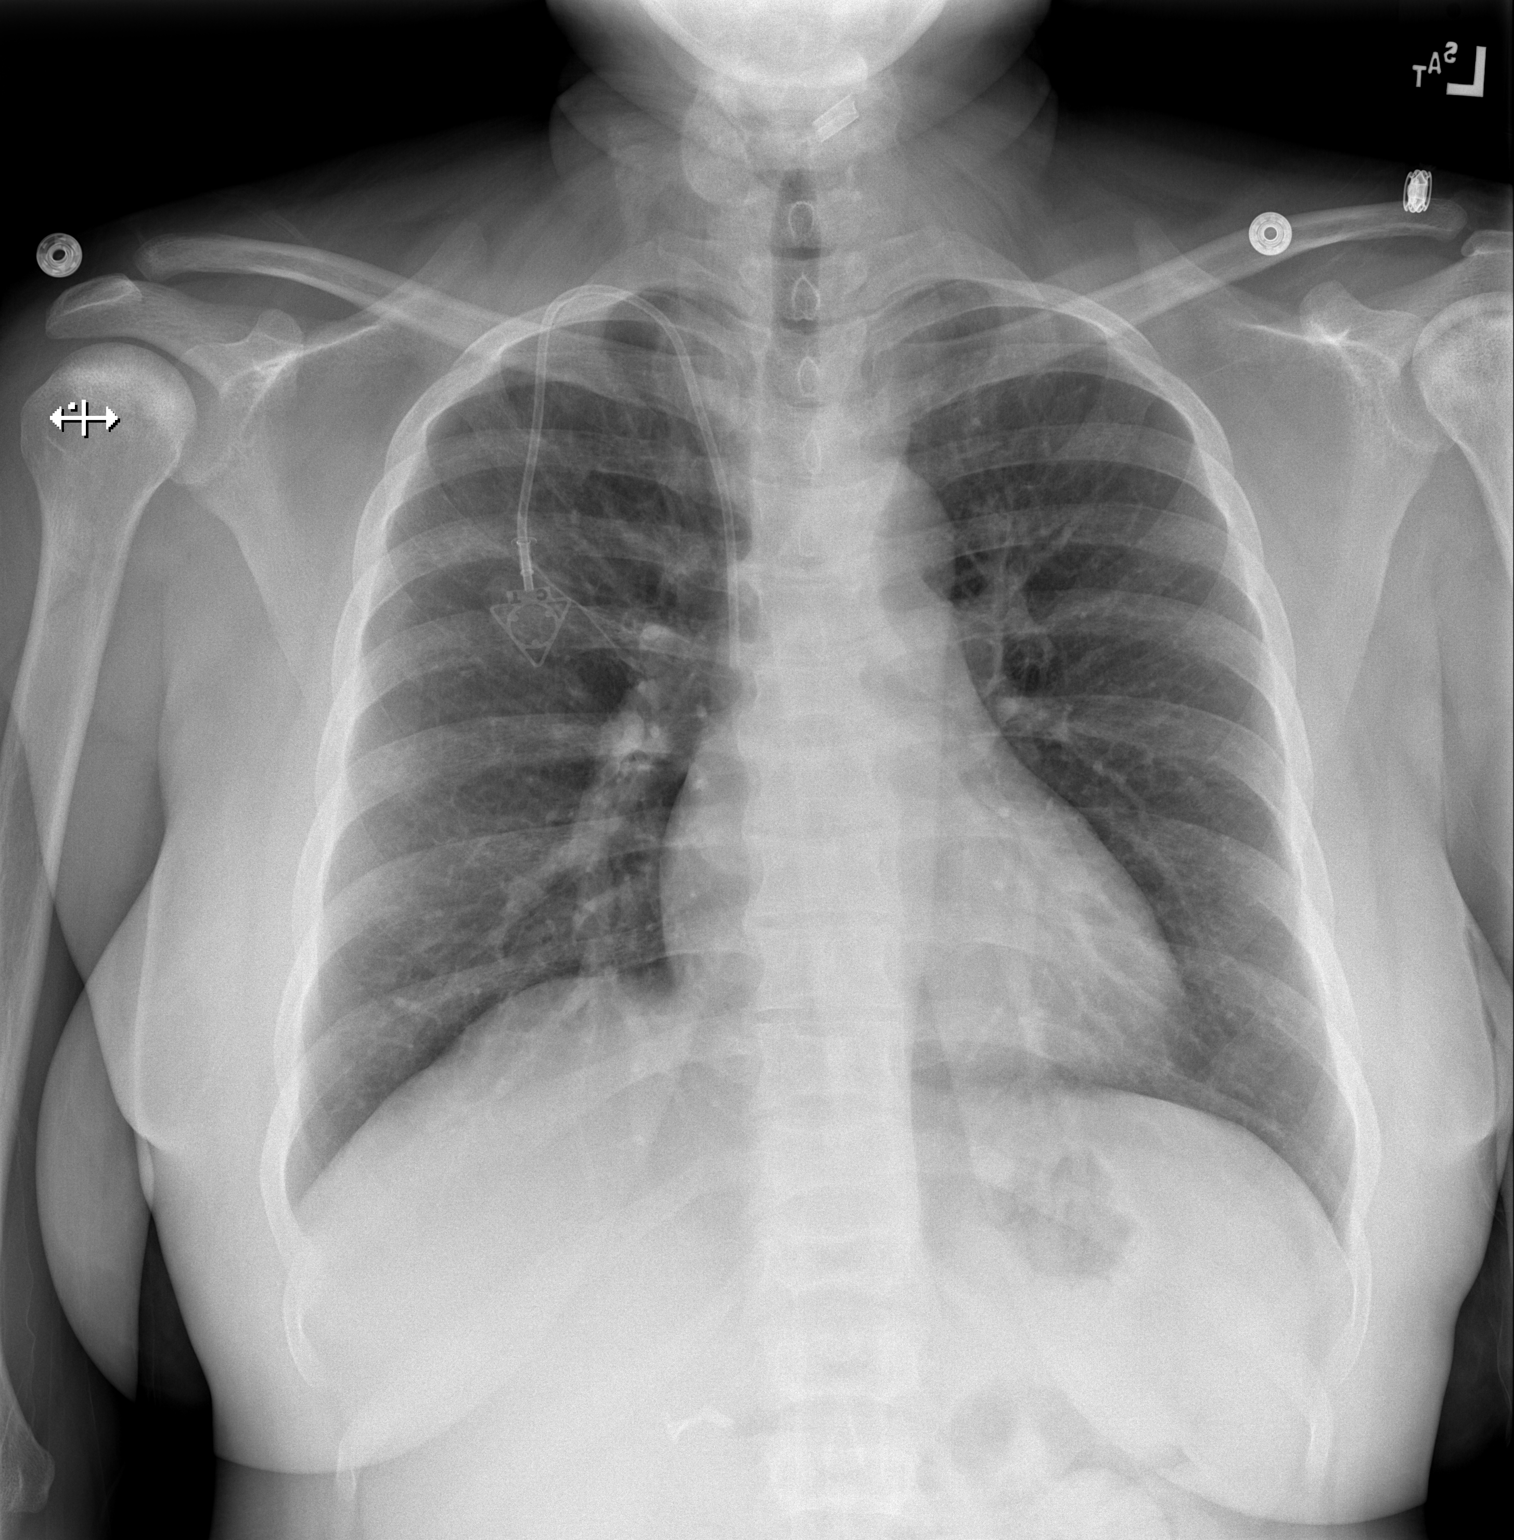

[w chest lat]
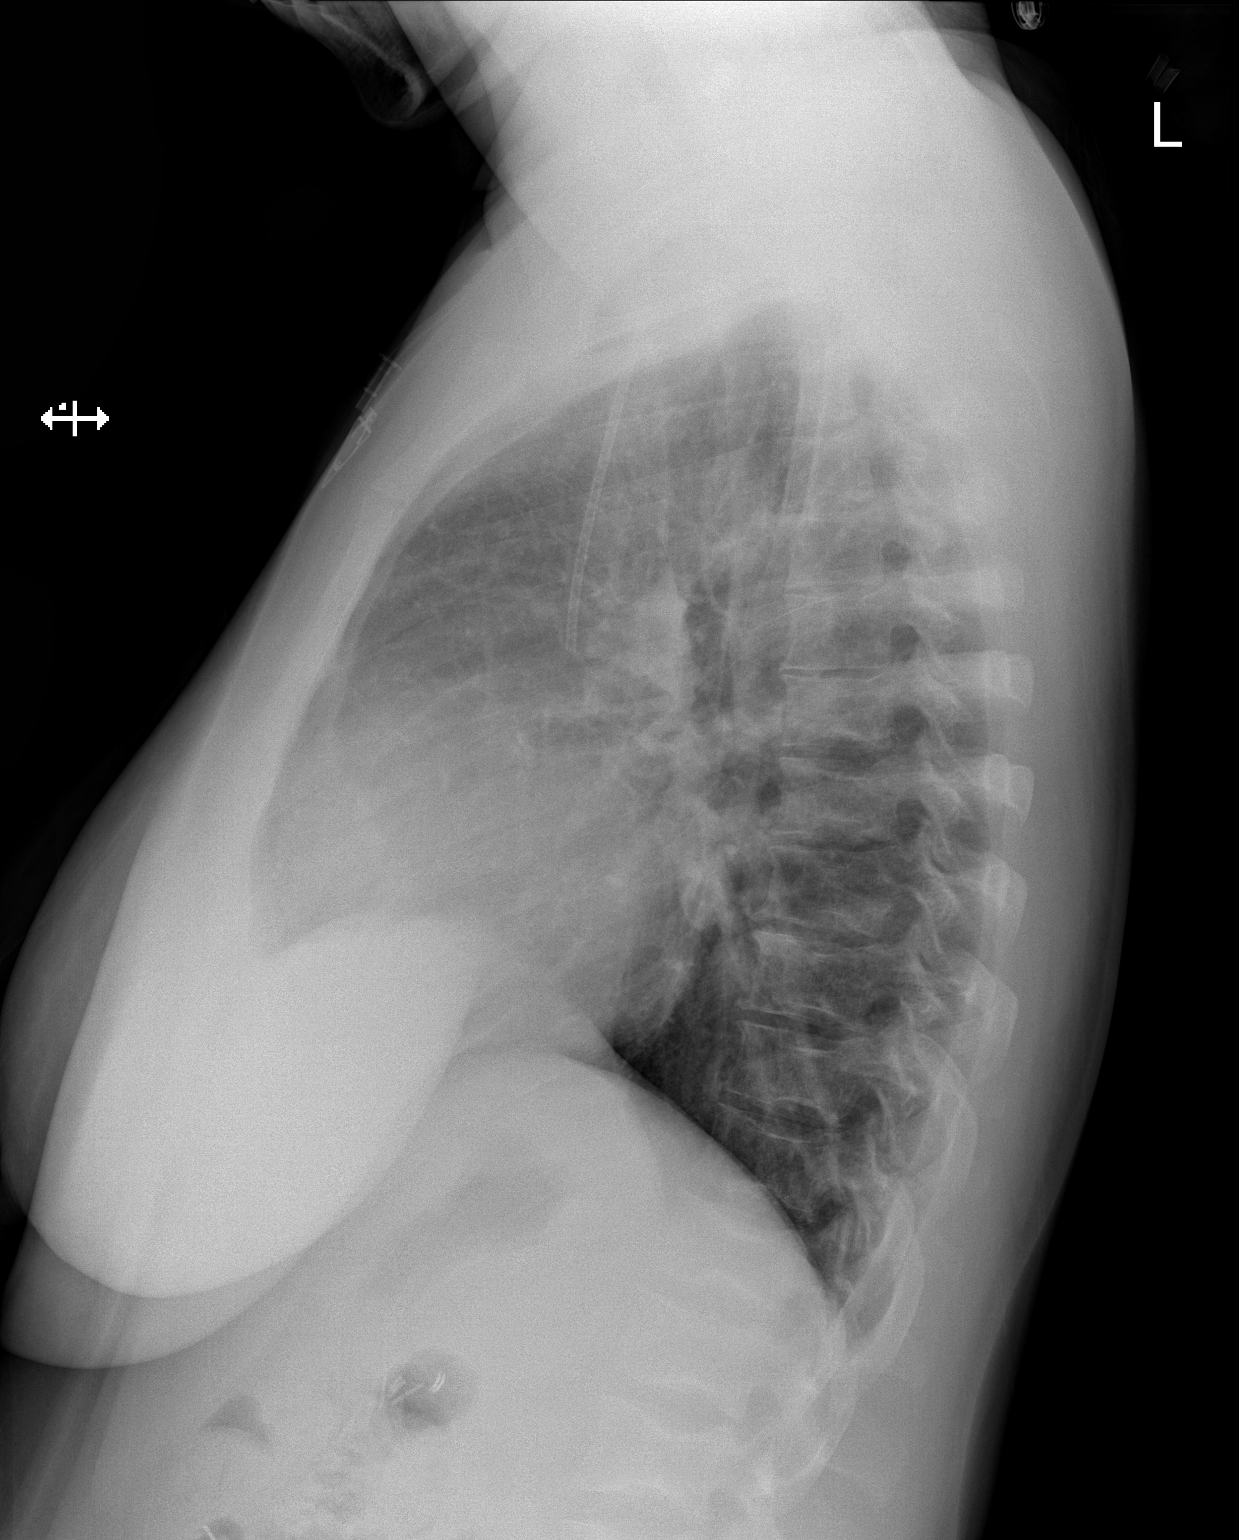

[2 of 2 positions shown; findings below may reference images not displayed]

FINDINGS: Cardiomediastinal silhouette is stable. No acute infiltrate or
pleural effusion. No pulmonary edema. There is right IJ Port-A-Cath
with tip in distal SVC. No pneumothorax. Bony thorax is
unremarkable.
IMPRESSION: No active cardiopulmonary disease.

## 2016-11-24 ENCOUNTER — Encounter (HOSPITAL_COMMUNITY): Payer: Self-pay | Admitting: Emergency Medicine

## 2016-11-24 ENCOUNTER — Telehealth (HOSPITAL_COMMUNITY): Payer: Self-pay | Admitting: Hematology

## 2016-11-24 ENCOUNTER — Emergency Department (HOSPITAL_COMMUNITY): Payer: Medicare Other

## 2016-11-24 ENCOUNTER — Emergency Department (HOSPITAL_COMMUNITY)
Admission: EM | Admit: 2016-11-24 | Discharge: 2016-11-24 | Disposition: A | Discharge status: Home / Self Care | Payer: Medicare Other | Attending: Emergency Medicine | Admitting: Emergency Medicine

## 2016-11-24 DIAGNOSIS — Z87891 Personal history of nicotine dependence: Secondary | ICD-10-CM | POA: Insufficient documentation

## 2016-11-24 DIAGNOSIS — R05 Cough: Secondary | ICD-10-CM

## 2016-11-24 DIAGNOSIS — Z7901 Long term (current) use of anticoagulants: Secondary | ICD-10-CM | POA: Diagnosis not present

## 2016-11-24 DIAGNOSIS — D5700 Hb-SS disease with crisis, unspecified: Secondary | ICD-10-CM | POA: Diagnosis not present

## 2016-11-24 DIAGNOSIS — R059 Cough, unspecified: Secondary | ICD-10-CM

## 2016-11-24 DIAGNOSIS — R079 Chest pain, unspecified: Secondary | ICD-10-CM | POA: Diagnosis not present

## 2016-11-24 DIAGNOSIS — Z91040 Latex allergy status: Secondary | ICD-10-CM | POA: Diagnosis not present

## 2016-11-24 DIAGNOSIS — R9431 Abnormal electrocardiogram [ECG] [EKG]: Secondary | ICD-10-CM | POA: Diagnosis not present

## 2016-11-24 LAB — DIFFERENTIAL
Basophils Absolute: 0.1 10*3/uL (ref 0.0–0.1)
Basophils Relative: 1 %
Eosinophils Absolute: 0.6 10*3/uL (ref 0.0–0.7)
Eosinophils Relative: 6 %
LYMPHS ABS: 1.5 10*3/uL (ref 0.7–4.0)
LYMPHS PCT: 14 %
Monocytes Absolute: 1.1 10*3/uL — ABNORMAL HIGH (ref 0.1–1.0)
Monocytes Relative: 11 %
NRBC: 2 /100{WBCs} — AB
Neutro Abs: 7 10*3/uL (ref 1.7–7.7)
Neutrophils Relative %: 68 %

## 2016-11-24 LAB — COMPREHENSIVE METABOLIC PANEL
ALBUMIN: 4.3 g/dL (ref 3.5–5.0)
ALK PHOS: 69 U/L (ref 38–126)
ALT: 24 U/L (ref 14–54)
AST: 36 U/L (ref 15–41)
Anion gap: 10 (ref 5–15)
BUN: 6 mg/dL (ref 6–20)
CALCIUM: 9 mg/dL (ref 8.9–10.3)
CO2: 24 mmol/L (ref 22–32)
Chloride: 105 mmol/L (ref 101–111)
Creatinine, Ser: 0.53 mg/dL (ref 0.44–1.00)
GFR calc Af Amer: >60 mL/min (ref >60)
GFR calc non Af Amer: >60 mL/min (ref >60)
GLUCOSE: 110 mg/dL — AB (ref 65–99)
Potassium: 3.9 mmol/L (ref 3.5–5.1)
Sodium: 139 mmol/L (ref 135–145)
TOTAL PROTEIN: 7.5 g/dL (ref 6.5–8.1)
Total Bilirubin: 2.3 mg/dL — ABNORMAL HIGH (ref 0.3–1.2)

## 2016-11-24 LAB — URINALYSIS, ROUTINE W REFLEX MICROSCOPIC
Bacteria, UA: NONE SEEN
Bilirubin Urine: NEGATIVE
Glucose, UA: NEGATIVE mg/dL
Ketones, ur: NEGATIVE mg/dL
Leukocytes, UA: NEGATIVE
NITRITE: NEGATIVE
Protein, ur: NEGATIVE mg/dL
SPECIFIC GRAVITY, URINE: 1.01 (ref 1.005–1.030)
pH: 6 (ref 5.0–8.0)

## 2016-11-24 LAB — RETICULOCYTES
RBC.: 2.68 MIL/uL — AB (ref 3.87–5.11)
RETIC COUNT ABSOLUTE: 439.5 10*3/uL — AB (ref 19.0–186.0)
Retic Ct Pct: 16.4 % — ABNORMAL HIGH (ref 0.4–3.1)

## 2016-11-24 LAB — CBC
HEMATOCRIT: 23.3 % — AB (ref 36.0–46.0)
Hemoglobin: 8.1 g/dL — ABNORMAL LOW (ref 12.0–15.0)
MCH: 30.2 pg (ref 26.0–34.0)
MCHC: 34.8 g/dL (ref 30.0–36.0)
MCV: 86.9 fL (ref 78.0–100.0)
Platelets: 636 10*3/uL — ABNORMAL HIGH (ref 150–400)
RBC: 2.68 MIL/uL — ABNORMAL LOW (ref 3.87–5.11)
RDW: 20.5 % — AB (ref 11.5–15.5)
WBC: 9.7 10*3/uL (ref 4.0–10.5)

## 2016-11-24 LAB — I-STAT BETA HCG BLOOD, ED (MC, WL, AP ONLY)

## 2016-11-24 LAB — LIPASE, BLOOD: Lipase: 19 U/L (ref 11–51)

## 2016-11-24 LAB — I-STAT TROPONIN, ED: Troponin i, poc: 0 ng/mL (ref 0.00–0.08)

## 2016-11-24 MED ORDER — ONDANSETRON HCL 4 MG/2ML IJ SOLN
4.0000 mg | INTRAMUSCULAR | Status: DC | PRN
  Administered 2016-11-24: 4 mg via INTRAVENOUS
  Filled 2016-11-24: qty 2

## 2016-11-24 MED ORDER — HYDROMORPHONE HCL 2 MG/ML IJ SOLN: 2.0000 mg | INTRAMUSCULAR | Status: AC

## 2016-11-24 MED ORDER — HYDROMORPHONE HCL 2 MG/ML IJ SOLN
2.0000 mg | INTRAMUSCULAR | Status: AC
  Administered 2016-11-24: 2 mg via INTRAVENOUS
  Filled 2016-11-24: qty 1

## 2016-11-24 MED ORDER — HEPARIN SOD (PORK) LOCK FLUSH 100 UNIT/ML IV SOLN
500.0000 [IU] | Freq: Once | INTRAVENOUS | Status: AC
  Administered 2016-11-24: 500 [IU]
  Filled 2016-11-24: qty 5

## 2016-11-24 MED ORDER — DIPHENHYDRAMINE HCL 25 MG PO CAPS
25.0000 mg | ORAL_CAPSULE | ORAL | Status: DC | PRN
  Administered 2016-11-24: 25 mg via ORAL
  Filled 2016-11-24: qty 1

## 2016-11-24 MED ORDER — KETOROLAC TROMETHAMINE 30 MG/ML IJ SOLN
30.0000 mg | Freq: Once | INTRAMUSCULAR | Status: AC
  Administered 2016-11-24: 30 mg via INTRAVENOUS
  Filled 2016-11-24: qty 1

## 2016-11-24 MED ORDER — HYDROMORPHONE HCL 2 MG/ML IJ SOLN
2.0000 mg | Freq: Once | INTRAMUSCULAR | Status: AC
  Administered 2016-11-24: 2 mg via INTRAVENOUS
  Filled 2016-11-24: qty 1

## 2016-11-24 NOTE — Telephone Encounter (Signed)
Called patient and advised patient that per NP she needs to rest and and continue taking medications as prescribed.  If pain worsens, patient can go to ED.  Patient verbalizes understanding.

## 2016-11-24 NOTE — ED Notes (Signed)
Patient transported to X-ray 

## 2016-11-24 NOTE — Discharge Instructions (Signed)
Continue your home pain medication. Follow-up with your primary care doctor. You may follow-up at the sickle cell clinic if her pain remains uncontrolled. If you are unable to be seen there you may return to the ED.

## 2016-11-24 NOTE — ED Provider Notes (Signed)
[facility] DEPT Provider Note   CSN: [id number] Arrival date & time: 11/24/16  1241     History   Chief Complaint Chief Complaint  Patient presents with  . Sickle Cell Pain Crisis    HPI [name] is a 26 y.o. female.  The history is provided by the patient and medical records.  Sickle Cell Pain Crisis   26 y.o. F with hx of sickle cell anemia, GERD, hx HSV, depression, hx of DVT on xarelto, migraine headaches, presenting to the ED for sickle cell pain crisis.  States last night she started having some aching in her upper and lower back.  States for the past 3 days she is also had a little cough.  States she does have some mild chest pain with coughing. Denies any shortness of breath. She did have some nausea and vomiting yesterday, none since then. She's been able to eat and drink normally. She denies any abdominal pain. States she's been taking her home pain medications without any noted relief, states her oxycodone mostly just makes her sleepy. Denies any urinary symptoms. States her goal is ultimately to go home today, states she does not like being admitted as she feels it is too stressful on her body.  Patient does take xarelto for hx of DVT, no hx of PE.  Past Medical History:  Diagnosis Date  . Blood transfusion    "lots"  . Blood transfusion without reported diagnosis   . Chronic back pain    "very severe; have knot in my back; from tight muscle; take RX and exercise for it"  . Depression 01/06/2011  . Exertional dyspnea    "sometimes"  . Genital HSV   . GERD (gastroesophageal reflux disease) 02/17/2011  . Migraines 11/08/11   "@ least twice/month"  . 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  . Mood swings ([place]) 11/08/11   "I go back and forth; real bad"  . Sickle cell anemia ([name])   . Sickle cell anemia with crisis ([place])   . Thrombocytosis ([place]) 11/09/2015  . Trichotillomania    h/o    Patient Active Problem List   Diagnosis Date Noted  .  Chronic anticoagulation   . Sickle cell anemia ([place]) 05/19/2016  . History of DVT (deep vein thrombosis) 04/17/2016  . Sickle cell anemia with pain ([place]) 02/16/2016  . Thrombocytosis ([place]) 11/09/2015  . Hyperbilirubinemia 11/09/2015  . Chronic pain 08/04/2015  . Herpes simplex 07/14/2015  . Anemia of chronic disease   . 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 CHOLECYSTECTOMY  05/2010  . DILATION AND CURETTAGE OF UTERUS  02/20/11   S/P miscarriage  . IR GENERIC HISTORICAL  12/23/2015   IR FLUORO GUIDE CV LINE RIGHT 12/23/2015 Jacqulynn Cadet, MD [facility]  . IR GENERIC HISTORICAL  12/23/2015   IR US GUIDE VASC ACCESS RIGHT 12/23/2015 Jacqulynn Cadet, MD [facility]    OB History    Gravida Para Term Preterm AB Living   5 2 2  0 3 2   SAB TAB Ectopic Multiple Live Births   3 0 0 0 2      Obstetric Comments   Miscarried in October 2012 at about 7 weeks       Home Medications    Prior to Admission medications   Medication Sig Start Date End Date Taking? Authorizing Provider  hydroxyurea (HYDREA)  500 MG capsule Take 2 capsules (1,000 mg total) by mouth daily. May take with food to minimize GI side effects. Patient taking differently: Take 1,500 mg by mouth daily. May take with food to minimize GI side effects. 04/18/16   Tresa Garter, MD  lidocaine (LIDODERM) 5 % Place 1 patch onto the skin daily. Remove & Discard patch within 12 hours or as directed by MD 11/11/16   [name], MD  medroxyPROGESTERone (DEPO-PROVERA) 150 MG/ML injection Inject 1 mL (150 mg total) into the muscle every 3 (three) months. 09/21/16   Shelly Bombard, MD  Menthol-Methyl Salicylate (MUSCLE RUB) 10-15 % CREA Apply 1 application topically as needed for muscle pain. 11/11/16   [name], MD  morphine (MS CONTIN) 60 MG 12 hr tablet Take 60 mg by  mouth every 12 (twelve) hours as needed for pain.     [provider]  oxyCODONE (ROXICODONE) 15 MG immediate release tablet Take 15 mg by mouth every 4 (four) hours as needed for pain.     [provider]  rivaroxaban (XARELTO) 20 MG TABS tablet Take 20 mg by mouth daily with supper.     [provider]  valACYclovir (VALTREX) 1000 MG tablet Take 1 tablet (1,000 mg total) by mouth daily. 05/02/16   [name], MD    Family History Family History  Problem Relation Age of Onset  . Sickle cell trait Mother   . Sickle cell trait Father   . Diabetes Maternal Grandmother   . Diabetes Paternal Grandmother   . Hypertension Paternal Grandmother   . Diabetes Maternal Grandfather     Social History Social History  Substance Use Topics  . Smoking status: Former Smoker    Packs/day: 0.25    Years: 1.00    Types: Cigarettes    Quit date: 03/25/2013  . Smokeless tobacco: Never Used  . Alcohol use Yes     Comment: once a month     Allergies   Food and Latex   Review of Systems Review of Systems  Musculoskeletal: Positive for back pain.  All other systems reviewed and are negative.    Physical Exam Updated Vital Signs BP (!) 137/126 (BP Location: Left Arm)   Pulse 93   Temp 98.2 F (36.8 C) (Oral)   Resp 18   Ht 5\' 1"  (1.549 m)   Wt 96.6 kg (213 lb)   SpO2 98%   BMI 40.25 kg/m   Physical Exam  Constitutional: She is oriented to person, place, and time. She appears well-developed and well-nourished.  HENT:  Head: Normocephalic and atraumatic.  Mouth/Throat: Oropharynx is clear and moist.  Eyes: Pupils are equal, round, and reactive to light. Conjunctivae and EOM are normal.  Neck: Normal range of motion.  Cardiovascular: Normal rate, regular rhythm and normal heart sounds.   Pulmonary/Chest: Effort normal and breath sounds normal. No respiratory distress. She has no wheezes.  Abdominal: Soft. Bowel sounds are normal. There is no  tenderness. There is no rebound.  Musculoskeletal: Normal range of motion.  No deformities of the spine; normal strength/sensation of both legs, normal gait  Neurological: She is alert and oriented to person, place, and time.  Skin: Skin is warm and dry.  Psychiatric: She has a normal mood and affect.  Nursing note and vitals reviewed.    ED Treatments / Results  Labs (all labs ordered are listed, but only abnormal results are displayed) Labs Reviewed  COMPREHENSIVE METABOLIC PANEL - Abnormal;  Notable for the following:       Result Value   Glucose, Bld 110 (*)    Total Bilirubin 2.3 (*)    All other components within normal limits  CBC - Abnormal; Notable for the following:    RBC 2.68 (*)    Hemoglobin 8.1 (*)    HCT 23.3 (*)    RDW 20.5 (*)    Platelets 636 (*)    All other components within normal limits  URINALYSIS, ROUTINE W REFLEX MICROSCOPIC - Abnormal; Notable for the following:    Hgb urine dipstick LARGE (*)    Squamous Epithelial / LPF 0-5 (*)    All other components within normal limits  RETICULOCYTES - Abnormal; Notable for the following:    Retic Ct Pct 16.4 (*)    RBC. 2.68 (*)    Retic Count, Absolute 439.5 (*)    All other components within normal limits  DIFFERENTIAL - Abnormal; Notable for the following:    Monocytes Absolute 1.1 (*)    nRBC 2 (*)    All other components within normal limits  LIPASE, BLOOD  I-STAT BETA HCG BLOOD, ED ([facility], [facility], [facility] ONLY)  I-STAT TROPOININ, ED    EKG  EKG Interpretation None       Radiology No results found.  Procedures Procedures (including critical care time)  Medications Ordered in ED Medications - No data to display   Initial Impression / Assessment and Plan / ED Course  I have reviewed the triage vital signs and the nursing notes.  Pertinent labs & imaging results that were available during my care of the patient were reviewed by me and considered in my medical decision making (see chart for  details).  26 year old female who with sickle cell pain crisis. Reports pain in her back. She is also had a mild cough and some chest pain with coughing. No fever or chills. Vital signs are stable here. She is nontoxic in appearance.  No focal neurologic deficits here to suggest acute spinal pathology. Lab work overall reassuring. Troponin negative. Chest x-ray is clear. No signs or symptoms concerning for acute chest at this time. Will attempt pain control.  5:19 PM Patient has had 4 doses of dilaudid as well as IV toradol. States she continues to have some pain but it is bearable. She does not want to be admitted at this time, she prefers to go home. She understands to take her home pain medication. If pain remains uncontrolled she may be seen at the sickle cell clinic or she can return to the ED. Patient acknowledged understanding and agreed with plan of care.  Final Clinical Impressions(s) / ED Diagnoses   Final diagnoses:  Sickle cell pain crisis ([place])  Cough    New Prescriptions New Prescriptions   No medications on file     Larene Pickett, PA-C 11/24/16 1756    Charlesetta Shanks, MD 12/01/16 1635

## 2016-11-24 NOTE — ED Triage Notes (Signed)
Pt complaint of n/v onset last night; pt continues to verbalize symptoms causing worsening lower back pain associated with SCC.

## 2016-11-24 NOTE — Telephone Encounter (Signed)
Patient C/O back pain that she rates 10/10 on pain scale.  Patient states she woke up with this pain.  Patient has taken morphine and oxycodone as prescribed with no improvement.  Patient C/O of chest pain and coughing going on for 1-2 days.  Patient denies N/V/D, or abdominal pain, or fever.  I advised I would speak with provider and give her a call back.  Patient verbalizes understanding.

## 2016-11-26 ENCOUNTER — Emergency Department (HOSPITAL_COMMUNITY)
Admission: EM | Admit: 2016-11-26 | Discharge: 2016-11-26 | Disposition: A | Discharge status: Home / Self Care | Payer: Medicare Other | Attending: Emergency Medicine | Admitting: Emergency Medicine

## 2016-11-26 ENCOUNTER — Encounter (HOSPITAL_COMMUNITY): Payer: Self-pay | Admitting: Oncology

## 2016-11-26 DIAGNOSIS — G894 Chronic pain syndrome: Secondary | ICD-10-CM | POA: Diagnosis not present

## 2016-11-26 DIAGNOSIS — Z91040 Latex allergy status: Secondary | ICD-10-CM | POA: Diagnosis not present

## 2016-11-26 DIAGNOSIS — Z79899 Other long term (current) drug therapy: Secondary | ICD-10-CM | POA: Diagnosis not present

## 2016-11-26 DIAGNOSIS — Z87891 Personal history of nicotine dependence: Secondary | ICD-10-CM | POA: Diagnosis not present

## 2016-11-26 DIAGNOSIS — Z7901 Long term (current) use of anticoagulants: Secondary | ICD-10-CM | POA: Insufficient documentation

## 2016-11-26 DIAGNOSIS — D5700 Hb-SS disease with crisis, unspecified: Secondary | ICD-10-CM | POA: Diagnosis not present

## 2016-11-26 DIAGNOSIS — G8929 Other chronic pain: Secondary | ICD-10-CM | POA: Diagnosis not present

## 2016-11-26 DIAGNOSIS — D571 Sickle-cell disease without crisis: Secondary | ICD-10-CM | POA: Diagnosis not present

## 2016-11-26 DIAGNOSIS — R05 Cough: Secondary | ICD-10-CM | POA: Diagnosis present

## 2016-11-26 LAB — COMPREHENSIVE METABOLIC PANEL
ALBUMIN: 4.3 g/dL (ref 3.5–5.0)
ALK PHOS: 65 U/L (ref 38–126)
ALT: 20 U/L (ref 14–54)
AST: 26 U/L (ref 15–41)
Anion gap: 8 (ref 5–15)
BUN: 6 mg/dL (ref 6–20)
CALCIUM: 8.9 mg/dL (ref 8.9–10.3)
CO2: 24 mmol/L (ref 22–32)
CREATININE: 0.61 mg/dL (ref 0.44–1.00)
Chloride: 105 mmol/L (ref 101–111)
GFR calc Af Amer: >60 mL/min (ref >60)
GFR calc non Af Amer: >60 mL/min (ref >60)
GLUCOSE: 181 mg/dL — AB (ref 65–99)
Potassium: 3.4 mmol/L — ABNORMAL LOW (ref 3.5–5.1)
SODIUM: 137 mmol/L (ref 135–145)
Total Bilirubin: 2 mg/dL — ABNORMAL HIGH (ref 0.3–1.2)
Total Protein: 7.4 g/dL (ref 6.5–8.1)

## 2016-11-26 LAB — CBC WITH DIFFERENTIAL/PLATELET
BASOS PCT: 1 %
Basophils Absolute: 0.1 10*3/uL (ref 0.0–0.1)
EOS ABS: 0.8 10*3/uL — AB (ref 0.0–0.7)
Eosinophils Relative: 10 %
HEMATOCRIT: 23.3 % — AB (ref 36.0–46.0)
Hemoglobin: 7.9 g/dL — ABNORMAL LOW (ref 12.0–15.0)
Lymphocytes Relative: 28 %
Lymphs Abs: 2.4 10*3/uL (ref 0.7–4.0)
MCH: 29.6 pg (ref 26.0–34.0)
MCHC: 33.9 g/dL (ref 30.0–36.0)
MCV: 87.3 fL (ref 78.0–100.0)
MONO ABS: 0.5 10*3/uL (ref 0.1–1.0)
MONOS PCT: 6 %
NEUTROS ABS: 4.7 10*3/uL (ref 1.7–7.7)
Neutrophils Relative %: 55 %
Platelets: 644 10*3/uL — ABNORMAL HIGH (ref 150–400)
RBC: 2.67 MIL/uL — ABNORMAL LOW (ref 3.87–5.11)
RDW: 20.8 % — ABNORMAL HIGH (ref 11.5–15.5)
WBC: 8.5 10*3/uL (ref 4.0–10.5)

## 2016-11-26 LAB — RETICULOCYTES
RBC.: 2.67 MIL/uL — AB (ref 3.87–5.11)
Retic Count, Absolute: 531.3 10*3/uL — ABNORMAL HIGH (ref 19.0–186.0)
Retic Ct Pct: 19.9 % — ABNORMAL HIGH (ref 0.4–3.1)

## 2016-11-26 MED ORDER — HYDROMORPHONE HCL 2 MG/ML IJ SOLN: 2.0000 mg | INTRAMUSCULAR | Status: AC

## 2016-11-26 MED ORDER — HYDROMORPHONE HCL 2 MG/ML IJ SOLN
2.0000 mg | Freq: Once | INTRAMUSCULAR | Status: AC
  Administered 2016-11-26: 2 mg via INTRAVENOUS
  Filled 2016-11-26: qty 1

## 2016-11-26 MED ORDER — DIPHENHYDRAMINE HCL 25 MG PO CAPS
25.0000 mg | ORAL_CAPSULE | ORAL | Status: DC | PRN
  Administered 2016-11-26: 25 mg via ORAL
  Filled 2016-11-26: qty 1

## 2016-11-26 MED ORDER — HYDROMORPHONE HCL 2 MG/ML IJ SOLN
2.0000 mg | INTRAMUSCULAR | Status: AC
  Administered 2016-11-26: 2 mg via INTRAVENOUS
  Filled 2016-11-26: qty 1

## 2016-11-26 MED ORDER — KETOROLAC TROMETHAMINE 30 MG/ML IJ SOLN
30.0000 mg | INTRAMUSCULAR | Status: AC
  Administered 2016-11-26: 30 mg via INTRAVENOUS
  Filled 2016-11-26: qty 1

## 2016-11-26 MED ORDER — HEPARIN SOD (PORK) LOCK FLUSH 100 UNIT/ML IV SOLN
500.0000 [IU] | Freq: Once | INTRAVENOUS | Status: AC
  Administered 2016-11-26: 500 [IU]
  Filled 2016-11-26: qty 5

## 2016-11-26 MED ORDER — ONDANSETRON HCL 4 MG/2ML IJ SOLN
4.0000 mg | INTRAMUSCULAR | Status: DC | PRN
  Administered 2016-11-26: 4 mg via INTRAVENOUS
  Filled 2016-11-26: qty 2

## 2016-11-26 NOTE — ED Provider Notes (Signed)
[facility] DEPT Provider Note   CSN: [id number] Arrival date & time: 11/26/16  0003     History   Chief Complaint Chief Complaint  Patient presents with  . Sickle Cell Pain Crisis    HPI [name] is a 26 y.o. female.  Patient with history of sickle cell disease, GERD, presents with persistent pain since being seen yesterday typical of her sickle cell crises. Pain is in her upper and lower back. She reports some mild chest pain with dry cough. No fever. No nausea or vomiting. She takes oxycodone for pain at home which is not helping for current symptoms.    The history is provided by the patient. No language interpreter was used.  Sickle Cell Pain Crisis  Associated symptoms: cough   Associated symptoms: no fever, no nausea and no vomiting     Past Medical History:  Diagnosis Date  . Blood transfusion    "lots"  . Blood transfusion without reported diagnosis   . Chronic back pain    "very severe; have knot in my back; from tight muscle; take RX and exercise for it"  . Depression 01/06/2011  . Exertional dyspnea    "sometimes"  . Genital HSV   . GERD (gastroesophageal reflux disease) 02/17/2011  . Migraines 11/08/11   "@ least twice/month"  . 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  . Mood swings ([place]) 11/08/11   "I go back and forth; real bad"  . Sickle cell anemia ([name])   . Sickle cell anemia with crisis ([place])   . Thrombocytosis ([place]) 11/09/2015  . Trichotillomania    h/o    Patient Active Problem List   Diagnosis Date Noted  . Chronic anticoagulation   . Sickle cell anemia ([place]) 05/19/2016  . History of DVT (deep vein thrombosis) 04/17/2016  . Sickle cell anemia with pain ([place]) 02/16/2016  . Thrombocytosis ([place]) 11/09/2015  . Hyperbilirubinemia 11/09/2015  . Chronic pain 08/04/2015  . Herpes simplex 07/14/2015  . Anemia of chronic disease   . 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 Hb-SS disease  without crisis ([place]) 07/20/1991    Past Surgical History:  Procedure Laterality Date  . CHOLECYSTECTOMY  05/2010  . DILATION AND CURETTAGE OF UTERUS  02/20/11   S/P miscarriage  . IR GENERIC HISTORICAL  12/23/2015   IR FLUORO GUIDE CV LINE RIGHT 12/23/2015 Jacqulynn Cadet, MD [facility]  . IR GENERIC HISTORICAL  12/23/2015   IR US GUIDE VASC ACCESS RIGHT 12/23/2015 Jacqulynn Cadet, MD [facility]    OB History    Gravida Para Term Preterm AB Living   5 2 2  0 3 2   SAB TAB Ectopic Multiple Live Births   3 0 0 0 2      Obstetric Comments   Miscarried in October 2012 at about 7 weeks       Home Medications    Prior to Admission medications   Medication Sig Start Date End Date Taking? Authorizing Provider  hydroxyurea (HYDREA) 500 MG capsule Take 2 capsules (1,000 mg total) by mouth daily. May take with food to minimize GI side effects. 04/18/16   Tresa Garter, MD  lidocaine (LIDODERM) 5 % Place 1 patch onto the skin daily. Remove & Discard patch within 12 hours or as directed by MD 11/11/16   [name], MD  medroxyPROGESTERone (DEPO-PROVERA) 150 MG/ML injection Inject 1 mL (150 mg total) into the muscle every 3 (three) months. 09/21/16  Shelly Bombard, MD  Menthol-Methyl Salicylate (MUSCLE RUB) 10-15 % CREA Apply 1 application topically as needed for muscle pain. 11/11/16   [name], MD  morphine (MS CONTIN) 60 MG 12 hr tablet Take 60 mg by mouth every 12 (twelve) hours as needed for pain.     [provider]  oxyCODONE (ROXICODONE) 15 MG immediate release tablet Take 15 mg by mouth every 4 (four) hours as needed for pain.     [provider]  rivaroxaban (XARELTO) 20 MG TABS tablet Take 20 mg by mouth daily with supper.     [provider]  valACYclovir (VALTREX) 1000 MG tablet Take 1 tablet (1,000 mg total) by mouth daily. 05/02/16   [name], MD    Family History Family History  Problem Relation Age of Onset    . Sickle cell trait Mother   . Sickle cell trait Father   . Diabetes Maternal Grandmother   . Diabetes Paternal Grandmother   . Hypertension Paternal Grandmother   . Diabetes Maternal Grandfather     Social History Social History  Substance Use Topics  . Smoking status: Former Smoker    Packs/day: 0.25    Years: 1.00    Types: Cigarettes    Quit date: 03/25/2013  . Smokeless tobacco: Never Used  . Alcohol use Yes     Comment: once a month     Allergies   Food and Latex   Review of Systems Review of Systems  Constitutional: Negative for chills and fever.  HENT: Negative.   Respiratory: Positive for cough and chest tightness.   Cardiovascular: Negative.   Gastrointestinal: Negative.  Negative for nausea and vomiting.  Musculoskeletal: Positive for back pain.  Skin: Negative.   Neurological: Negative.      Physical Exam Updated Vital Signs BP 118/87   Pulse 84   Temp 98.4 F (36.9 C) (Oral)   Resp 14   Ht 5\' 1"  (1.549 m)   Wt 90.7 kg (200 lb)   SpO2 95%   BMI 37.79 kg/m   Physical Exam  Constitutional: She is oriented to person, place, and time. She appears well-developed and well-nourished.  HENT:  Head: Normocephalic.  Neck: Normal range of motion. Neck supple.  Cardiovascular: Normal rate and regular rhythm.   No murmur heard. Pulmonary/Chest: Effort normal and breath sounds normal. She has no wheezes. She has no rales. She exhibits tenderness.  Abdominal: Soft. Bowel sounds are normal. There is no tenderness. There is no rebound and no guarding.  Musculoskeletal: Normal range of motion. She exhibits no edema.  Tenderness across lower back. No swelling or redness of extremities.   Neurological: She is alert and oriented to person, place, and time.  Skin: Skin is warm and dry. No rash noted.  Psychiatric: She has a normal mood and affect.     ED Treatments / Results  Labs (all labs ordered are listed, but only abnormal results are  displayed) Labs Reviewed  CBC WITH DIFFERENTIAL/PLATELET - Abnormal; Notable for the following:       Result Value   RBC 2.67 (*)    Hemoglobin 7.9 (*)    HCT 23.3 (*)    RDW 20.8 (*)    Platelets 644 (*)    Eosinophils Absolute 0.8 (*)    All other components within normal limits  RETICULOCYTES - Abnormal; Notable for the following:    Retic Ct Pct 19.9 (*)    RBC. 2.67 (*)    Retic  Count, Absolute 531.3 (*)    All other components within normal limits  COMPREHENSIVE METABOLIC PANEL    EKG  EKG Interpretation None       Radiology Dg Chest 2 View  Result Date: 11/24/2016 CLINICAL DATA:  Back and chest pain, cough for 3 days. History of sickle cell. EXAM: CHEST  2 VIEW COMPARISON:  Chest radiograph June 26, 2016 FINDINGS: Cardiac silhouette is mildly enlarged, mediastinal silhouette is unremarkable. No pleural effusion focal consolidation. No pneumothorax. Single lumen RIGHT chest Port-A-Cath with catheter components projecting below the port. No pneumothorax. 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 H-shaped vertebral bodies. Surgical clips in the included right abdomen compatible with cholecystectomy. IMPRESSION: Mild cardiomegaly, no acute pulmonary process. Chronic changes of sickle cell anemia. Electronically Signed   By: Elon Alas M.D.   On: 11/24/2016 14:18    Procedures Procedures (including critical care time)  Medications Ordered in ED Medications  HYDROmorphone (DILAUDID) injection 2 mg (not administered)    Or  HYDROmorphone (DILAUDID) injection 2 mg (not administered)  diphenhydrAMINE (BENADRYL) capsule 25-50 mg (25 mg Oral Given 11/26/16 0310)  ondansetron (ZOFRAN) injection 4 mg (4 mg Intravenous Given 11/26/16 0310)  HYDROmorphone (DILAUDID) injection 2 mg (2 mg Intravenous Given 11/26/16 0311)    Or  HYDROmorphone (DILAUDID) injection 2 mg ( Subcutaneous See Alternative 11/26/16 0311)  HYDROmorphone (DILAUDID) injection 2 mg (2  mg Intravenous Given 11/26/16 0346)    Or  HYDROmorphone (DILAUDID) injection 2 mg ( Subcutaneous See Alternative 11/26/16 0346)  ketorolac (TORADOL) 30 MG/ML injection 30 mg (30 mg Intravenous Given 11/26/16 7793)     Initial Impression / Assessment and Plan / ED Course  I have reviewed the triage vital signs and the nursing notes.  Pertinent labs & imaging results that were available during my care of the patient were reviewed by me and considered in my medical decision making (see chart for details).     Patient familiar to the ED presents with complaint of upper and lower back pain c/w previous sickle cell pain. No fever. See yesterday for same, negative CXR at that time.   She is afebrile on arrival. Normal O2 saturations. No significant chest pain. CXR is not repeated.   Pain is improved over time. She reports she would like to go home and will follow up with primary care.  Final Clinical Impressions(s) / ED Diagnoses   Final diagnoses:  None   1. Sickle cell anemia with pain  New Prescriptions New Prescriptions   No medications on file     Charlann Lange, Hershal Coria 11/26/16 9030    Rolland Porter, MD 11/26/16 0830

## 2016-11-26 NOTE — ED Triage Notes (Signed)
Pt presents w/ typical sickle cell pain crisis.  Pain is in her back.  Pt rates pain 9/10.  Was offered admission yesterday and declined.

## 2016-11-29 DIAGNOSIS — Z79891 Long term (current) use of opiate analgesic: Secondary | ICD-10-CM | POA: Diagnosis not present

## 2016-12-05 ENCOUNTER — Emergency Department (HOSPITAL_COMMUNITY)
Admission: EM | Admit: 2016-12-05 | Discharge: 2016-12-05 | Disposition: A | Discharge status: Home / Self Care | Payer: Medicare Other | Attending: Emergency Medicine | Admitting: Emergency Medicine

## 2016-12-05 ENCOUNTER — Encounter (HOSPITAL_COMMUNITY): Payer: Self-pay | Admitting: Emergency Medicine

## 2016-12-05 DIAGNOSIS — Z86718 Personal history of other venous thrombosis and embolism: Secondary | ICD-10-CM | POA: Diagnosis not present

## 2016-12-05 DIAGNOSIS — M79621 Pain in right upper arm: Secondary | ICD-10-CM | POA: Diagnosis present

## 2016-12-05 DIAGNOSIS — Z79899 Other long term (current) drug therapy: Secondary | ICD-10-CM | POA: Diagnosis not present

## 2016-12-05 DIAGNOSIS — Z91040 Latex allergy status: Secondary | ICD-10-CM | POA: Insufficient documentation

## 2016-12-05 DIAGNOSIS — D5700 Hb-SS disease with crisis, unspecified: Secondary | ICD-10-CM | POA: Diagnosis not present

## 2016-12-05 DIAGNOSIS — Z7901 Long term (current) use of anticoagulants: Secondary | ICD-10-CM | POA: Insufficient documentation

## 2016-12-05 DIAGNOSIS — Z87891 Personal history of nicotine dependence: Secondary | ICD-10-CM | POA: Diagnosis not present

## 2016-12-05 DIAGNOSIS — D57219 Sickle-cell/Hb-C disease with crisis, unspecified: Secondary | ICD-10-CM | POA: Diagnosis not present

## 2016-12-05 LAB — RETICULOCYTES
RBC.: 2.67 MIL/uL — ABNORMAL LOW (ref 3.87–5.11)
RETIC COUNT ABSOLUTE: 411.2 10*3/uL — AB (ref 19.0–186.0)
RETIC CT PCT: 15.4 % — AB (ref 0.4–3.1)

## 2016-12-05 LAB — CBC WITH DIFFERENTIAL/PLATELET
BASOS ABS: 0.1 10*3/uL (ref 0.0–0.1)
Basophils Relative: 1 %
EOS ABS: 0.6 10*3/uL (ref 0.0–0.7)
EOS PCT: 6 %
HCT: 23.6 % — ABNORMAL LOW (ref 36.0–46.0)
Hemoglobin: 8.1 g/dL — ABNORMAL LOW (ref 12.0–15.0)
LYMPHS PCT: 22 %
Lymphs Abs: 2.3 10*3/uL (ref 0.7–4.0)
MCH: 30.3 pg (ref 26.0–34.0)
MCHC: 34.3 g/dL (ref 30.0–36.0)
MCV: 88.4 fL (ref 78.0–100.0)
Monocytes Absolute: 1.1 10*3/uL — ABNORMAL HIGH (ref 0.1–1.0)
Monocytes Relative: 11 %
NEUTROS PCT: 62 %
Neutro Abs: 6.6 10*3/uL (ref 1.7–7.7)
PLATELETS: 563 10*3/uL — AB (ref 150–400)
RBC: 2.67 MIL/uL — AB (ref 3.87–5.11)
RDW: 20.3 % — ABNORMAL HIGH (ref 11.5–15.5)
WBC: 10.7 10*3/uL — AB (ref 4.0–10.5)

## 2016-12-05 LAB — COMPREHENSIVE METABOLIC PANEL
ALT: 14 U/L (ref 14–54)
AST: 23 U/L (ref 15–41)
Albumin: 4.2 g/dL (ref 3.5–5.0)
Alkaline Phosphatase: 58 U/L (ref 38–126)
Anion gap: 6 (ref 5–15)
BILIRUBIN TOTAL: 2 mg/dL — AB (ref 0.3–1.2)
BUN: 11 mg/dL (ref 6–20)
CO2: 26 mmol/L (ref 22–32)
CREATININE: 0.72 mg/dL (ref 0.44–1.00)
Calcium: 8.3 mg/dL — ABNORMAL LOW (ref 8.9–10.3)
Chloride: 105 mmol/L (ref 101–111)
Glucose, Bld: 102 mg/dL — ABNORMAL HIGH (ref 65–99)
POTASSIUM: 4.1 mmol/L (ref 3.5–5.1)
Sodium: 137 mmol/L (ref 135–145)
TOTAL PROTEIN: 7 g/dL (ref 6.5–8.1)

## 2016-12-05 MED ORDER — HEPARIN SOD (PORK) LOCK FLUSH 100 UNIT/ML IV SOLN
500.0000 [IU] | Freq: Once | INTRAVENOUS | Status: AC
  Administered 2016-12-05: 500 [IU]
  Filled 2016-12-05: qty 5

## 2016-12-05 MED ORDER — HYDROMORPHONE HCL 1 MG/ML IJ SOLN: 2.0000 mg | INTRAMUSCULAR | Status: AC

## 2016-12-05 MED ORDER — HYDROMORPHONE HCL 1 MG/ML IJ SOLN
2.0000 mg | INTRAMUSCULAR | Status: AC
  Administered 2016-12-05: 2 mg via INTRAVENOUS
  Filled 2016-12-05: qty 2

## 2016-12-05 MED ORDER — ONDANSETRON HCL 4 MG/2ML IJ SOLN
4.0000 mg | INTRAMUSCULAR | Status: DC | PRN
  Filled 2016-12-05: qty 2

## 2016-12-05 MED ORDER — DIPHENHYDRAMINE HCL 25 MG PO CAPS
25.0000 mg | ORAL_CAPSULE | Freq: Once | ORAL | Status: AC
  Administered 2016-12-05: 25 mg via ORAL
  Filled 2016-12-05: qty 1

## 2016-12-05 MED ORDER — HYDROMORPHONE HCL 1 MG/ML IJ SOLN: 2.0000 mg | INTRAMUSCULAR | Status: DC

## 2016-12-05 MED ORDER — SODIUM CHLORIDE 0.45 % IV SOLN
INTRAVENOUS | Status: DC
  Administered 2016-12-05: 05:00:00 via INTRAVENOUS

## 2016-12-05 MED ORDER — KETOROLAC TROMETHAMINE 30 MG/ML IJ SOLN
30.0000 mg | INTRAMUSCULAR | Status: AC
  Administered 2016-12-05: 30 mg via INTRAVENOUS
  Filled 2016-12-05: qty 1

## 2016-12-05 MED ORDER — DIPHENHYDRAMINE HCL 50 MG/ML IJ SOLN
25.0000 mg | Freq: Once | INTRAMUSCULAR | Status: AC
  Administered 2016-12-05: 25 mg via INTRAVENOUS
  Filled 2016-12-05: qty 1

## 2016-12-05 NOTE — ED Triage Notes (Signed)
Patient states that she is having sickle cell pain in her right upper arm. Patient states that she tried to sleep but her arm is hurting so bad that she couldn't.

## 2016-12-05 NOTE — ED Provider Notes (Signed)
[facility] DEPT Provider Note   CSN: [id number] Arrival date & time: 12/05/16  0254     History   Chief Complaint Chief Complaint  Patient presents with  . Sickle Cell Pain Crisis    HPI [name] is a 26 y.o. female.  [name] is a 26 y.o. female with a history of sickle cell anemia and DVT on Xarelto who presents to the emergency department complaining of right upper arm pain ongoing for the past 2 days. Patient reports this feels like her typical sickle cell pain. Patient reports she's been working out more and working on her arms. She reports she believes is exacerbated her symptoms. She reports gradual onset of pain around her right deltoid that has worsened. This feels like her typical sickle cell pain. She denies any arm swelling. She reports a history of a DVT and is compliant with her Xarelto. She reports this feels different than when she had a DVT. She took her pain medications at home around 11 PM prior to arrival with little relief. She denies fevers, chest pain, shortness of breath, extremity swelling, numbness, tingling, weakness, arm injury, or rashes.   The history is provided by the patient and medical records. No language interpreter was used.  Sickle Cell Pain Crisis  Associated symptoms: no chest pain, no congestion, no cough, no fever, no headaches, no nausea, no shortness of breath, no sore throat and no vomiting     Past Medical History:  Diagnosis Date  . Blood transfusion    "lots"  . Blood transfusion without reported diagnosis   . Chronic back pain    "very severe; have knot in my back; from tight muscle; take RX and exercise for it"  . Depression 01/06/2011  . Exertional dyspnea    "sometimes"  . Genital HSV   . GERD (gastroesophageal reflux disease) 02/17/2011  . Migraines 11/08/11   "@ least twice/month"  . 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  . Mood swings ([place]) 11/08/11   "I go back and forth; real bad"  .  Sickle cell anemia ([name])   . Sickle cell anemia with crisis ([place])   . Thrombocytosis ([place]) 11/09/2015  . Trichotillomania    h/o    Patient Active Problem List   Diagnosis Date Noted  . Chronic anticoagulation   . Sickle cell anemia ([place]) 05/19/2016  . History of DVT (deep vein thrombosis) 04/17/2016  . Sickle cell anemia with pain ([place]) 02/16/2016  . Thrombocytosis ([place]) 11/09/2015  . Hyperbilirubinemia 11/09/2015  . Chronic pain 08/04/2015  . Herpes simplex 07/14/2015  . Anemia of chronic disease   . 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 CHOLECYSTECTOMY  05/2010  . DILATION AND CURETTAGE OF UTERUS  02/20/11   S/P miscarriage  . IR GENERIC HISTORICAL  12/23/2015   IR FLUORO GUIDE CV LINE RIGHT 12/23/2015 Jacqulynn Cadet, MD [facility]  . IR GENERIC HISTORICAL  12/23/2015   IR US GUIDE VASC ACCESS RIGHT 12/23/2015 Jacqulynn Cadet, MD [facility]    OB History    Gravida Para Term Preterm AB Living   5 2 2  0 3 2   SAB TAB Ectopic Multiple Live Births   3 0 0 0 2      Obstetric Comments   Miscarried in October 2012 at about 7 weeks  Home Medications    Prior to Admission medications   Medication Sig Start Date End Date Taking? Authorizing Provider  hydroxyurea (HYDREA) 500 MG capsule Take 2 capsules (1,000 mg total) by mouth daily. May take with food to minimize GI side effects. 04/18/16  Yes Jegede, Olugbemiga E, MD  lidocaine (LIDODERM) 5 % Place 1 patch onto the skin daily. Remove & Discard patch within 12 hours or as directed by MD Patient taking differently: Place 1 patch onto the skin daily as needed (pain). Remove & Discard patch within 12 hours or as directed by MD 11/11/16  Yes [name], MD  medroxyPROGESTERone (DEPO-PROVERA) 150 MG/ML injection Inject 1 mL (150 mg total) into the muscle every 3 (three) months.  09/21/16  Yes Shelly Bombard, MD  Menthol-Methyl Salicylate (MUSCLE RUB) 10-15 % CREA Apply 1 application topically as needed for muscle pain. 11/11/16  Yes [name], MD  morphine (MS CONTIN) 60 MG 12 hr tablet Take 60 mg by mouth every 12 (twelve) hours as needed for pain.    Yes [provider]  oxyCODONE (ROXICODONE) 15 MG immediate release tablet Take 15 mg by mouth every 4 (four) hours as needed for pain.    Yes [provider]  rivaroxaban (XARELTO) 20 MG TABS tablet Take 20 mg by mouth daily with supper.    Yes [provider]  valACYclovir (VALTREX) 1000 MG tablet Take 1 tablet (1,000 mg total) by mouth daily. 05/02/16  Yes [name], MD    Family History Family History  Problem Relation Age of Onset  . Sickle cell trait Mother   . Sickle cell trait Father   . Diabetes Maternal Grandmother   . Diabetes Paternal Grandmother   . Hypertension Paternal Grandmother   . Diabetes Maternal Grandfather     Social History Social History  Substance Use Topics  . Smoking status: Former Smoker    Packs/day: 0.25    Years: 1.00    Types: Cigarettes    Quit date: 03/25/2013  . Smokeless tobacco: Never Used  . Alcohol use Yes     Comment: once a month     Allergies   Food and Latex   Review of Systems Review of Systems  Constitutional: Negative for chills and fever.  HENT: Negative for congestion and sore throat.   Eyes: Negative for visual disturbance.  Respiratory: Negative for cough and shortness of breath.   Cardiovascular: Negative for chest pain and leg swelling.  Gastrointestinal: Negative for abdominal pain, nausea and vomiting.  Genitourinary: Negative for dysuria.  Musculoskeletal: Positive for arthralgias. Negative for back pain, joint swelling and neck pain.  Skin: Negative for color change, rash and wound.  Neurological: Negative for weakness, numbness and headaches.     Physical Exam Updated Vital Signs BP  (!) 109/59   Pulse 82   Temp 98.4 F (36.9 C) (Oral)   Resp 14   Ht 5\' 1"  (1.549 m)   Wt 90.7 kg (200 lb)   SpO2 96%   BMI 37.79 kg/m   Physical Exam  Constitutional: She appears well-developed and well-nourished. No distress.  Nontoxic appearing.  HENT:  Head: Normocephalic and atraumatic.  Mouth/Throat: Oropharynx is clear and moist.  Eyes: Pupils are equal, round, and reactive to light. Conjunctivae are normal. Right eye exhibits no discharge. Left eye exhibits no discharge.  Neck: Neck supple. No JVD present.  Cardiovascular: Normal rate, regular rhythm, normal heart sounds and intact distal pulses.  Exam reveals no  gallop and no friction rub.   No murmur heard. Bilateral radial pulses are intact. Good capillary refill to her bilateral fingertips.  Pulmonary/Chest: Effort normal and breath sounds normal. No stridor. No respiratory distress. She has no wheezes. She has no rales.  Lungs are clear to ascultation bilaterally. Symmetric chest expansion bilaterally. No increased work of breathing. No rales or rhonchi.    Abdominal: Soft. There is no tenderness.  Musculoskeletal: Normal range of motion. She exhibits tenderness. She exhibits no edema or deformity.  Tenderness overlying her right deltoid. No extremity edema or erythema. Good range of motion of her right shoulder and elbow.   Lymphadenopathy:    She has no cervical adenopathy.  Neurological: She is alert. No sensory deficit. Coordination normal.  Skin: Skin is warm and dry. Capillary refill takes less than 2 seconds. No rash noted. She is not diaphoretic. No erythema. No pallor.  Psychiatric: She has a normal mood and affect. Her behavior is normal.  Nursing note and vitals reviewed.    ED Treatments / Results  Labs (all labs ordered are listed, but only abnormal results are displayed) Labs Reviewed  COMPREHENSIVE METABOLIC PANEL - Abnormal; Notable for the following:       Result Value   Glucose, Bld 102 (*)     Calcium 8.3 (*)    Total Bilirubin 2.0 (*)    All other components within normal limits  CBC WITH DIFFERENTIAL/PLATELET - Abnormal; Notable for the following:    WBC 10.7 (*)    RBC 2.67 (*)    Hemoglobin 8.1 (*)    HCT 23.6 (*)    RDW 20.3 (*)    Platelets 563 (*)    Monocytes Absolute 1.1 (*)    All other components within normal limits  RETICULOCYTES - Abnormal; Notable for the following:    Retic Ct Pct 15.4 (*)    RBC. 2.67 (*)    Retic Count, Absolute 411.2 (*)    All other components within normal limits    EKG  EKG Interpretation None       Radiology No results found.  Procedures Procedures (including critical care time)  Medications Ordered in ED Medications  0.45 % sodium chloride infusion ( Intravenous New Bag/Given 12/05/16 0431)  ondansetron (ZOFRAN) injection 4 mg (not administered)  ketorolac (TORADOL) 30 MG/ML injection 30 mg (30 mg Intravenous Given 12/05/16 0431)  HYDROmorphone (DILAUDID) injection 2 mg (2 mg Intravenous Given 12/05/16 0432)    Or  HYDROmorphone (DILAUDID) injection 2 mg ( Subcutaneous See Alternative 12/05/16 0432)  HYDROmorphone (DILAUDID) injection 2 mg (2 mg Intravenous Given 12/05/16 0550)    Or  HYDROmorphone (DILAUDID) injection 2 mg ( Subcutaneous See Alternative 12/05/16 0550)  HYDROmorphone (DILAUDID) injection 2 mg (2 mg Intravenous Given 12/05/16 4782)    Or  HYDROmorphone (DILAUDID) injection 2 mg ( Subcutaneous See Alternative 12/05/16 0637)  diphenhydrAMINE (BENADRYL) injection 25 mg (25 mg Intravenous Given 12/05/16 0431)  diphenhydrAMINE (BENADRYL) capsule 25 mg (25 mg Oral Given 12/05/16 9562)     Initial Impression / Assessment and Plan / ED Course  I have reviewed the triage vital signs and the nursing notes.  Pertinent labs & imaging results that were available during my care of the patient were reviewed by me and considered in my medical decision making (see chart for details).    This is a 26 y.o. 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 Patient reports this feels like her typical sickle cell pain. Patient reports she's been working out more and working on her arms. She reports she believes is exacerbated her symptoms. She reports gradual onset of pain around her right deltoid that has worsened. This feels like her typical sickle cell pain. She denies any arm swelling. She reports a history of a DVT and is compliant with her Xarelto. She reports this feels different than when she had a DVT. She took her pain medications at home around 11 PM prior to arrival with little relief. She denies fevers, chest pain, shortness of breath, extremity swelling On exam patient is afebrile nontoxic appearing. She has some tenderness over her right deltoid. No extremity swelling. She is neurovascularly intact. CMP is unremarkable. CBC shows a hemoglobin of 8.1. This is around her baseline, and better than most recent blood work.  Patient received 3 rounds of IV pain medicine in the emergency department. After third round of pain medication at reevaluation patient reports she is feeling better and feeling ready for discharge. She declines admission. We'll discharge with close follow-up by her sickle cell provider. I advised the patient to follow-up with their primary care provider this week. I advised the patient to return to the emergency department with new or worsening symptoms or new concerns. The patient verbalized understanding and agreement with plan.     Final Clinical Impressions(s) / ED Diagnoses   Final diagnoses:  Sickle cell pain crisis [facility])    New Prescriptions New Prescriptions   No medications on file     Waynetta Pean, Hershal Coria 12/05/16 2637    Molpus, Jenny Reichmann, MD 12/05/16 [phone]

## 2016-12-09 ENCOUNTER — Encounter (HOSPITAL_COMMUNITY): Payer: Self-pay | Admitting: Emergency Medicine

## 2016-12-09 ENCOUNTER — Inpatient Hospital Stay (HOSPITAL_COMMUNITY)
Admission: EM | Admit: 2016-12-09 | Discharge: 2016-12-14 | DRG: 812 | Disposition: A | Discharge status: Home / Self Care | Payer: Medicare Other | Attending: Internal Medicine | Admitting: Internal Medicine

## 2016-12-09 ENCOUNTER — Encounter (HOSPITAL_COMMUNITY): Payer: Self-pay

## 2016-12-09 ENCOUNTER — Emergency Department (HOSPITAL_COMMUNITY)
Admission: EM | Admit: 2016-12-09 | Discharge: 2016-12-09 | Disposition: A | Discharge status: Home / Self Care | Payer: Medicare Other | Source: Home / Self Care | Attending: Emergency Medicine | Admitting: Emergency Medicine

## 2016-12-09 DIAGNOSIS — Z7901 Long term (current) use of anticoagulants: Secondary | ICD-10-CM

## 2016-12-09 DIAGNOSIS — Z87891 Personal history of nicotine dependence: Secondary | ICD-10-CM

## 2016-12-09 DIAGNOSIS — Z79899 Other long term (current) drug therapy: Secondary | ICD-10-CM

## 2016-12-09 DIAGNOSIS — M79604 Pain in right leg: Secondary | ICD-10-CM

## 2016-12-09 DIAGNOSIS — K219 Gastro-esophageal reflux disease without esophagitis: Secondary | ICD-10-CM | POA: Diagnosis not present

## 2016-12-09 DIAGNOSIS — D5700 Hb-SS disease with crisis, unspecified: Principal | ICD-10-CM | POA: Diagnosis present

## 2016-12-09 DIAGNOSIS — Z833 Family history of diabetes mellitus: Secondary | ICD-10-CM

## 2016-12-09 DIAGNOSIS — Z91040 Latex allergy status: Secondary | ICD-10-CM

## 2016-12-09 DIAGNOSIS — D638 Anemia in other chronic diseases classified elsewhere: Secondary | ICD-10-CM | POA: Diagnosis present

## 2016-12-09 DIAGNOSIS — G8929 Other chronic pain: Secondary | ICD-10-CM

## 2016-12-09 DIAGNOSIS — R52 Pain, unspecified: Secondary | ICD-10-CM | POA: Diagnosis not present

## 2016-12-09 DIAGNOSIS — Z8249 Family history of ischemic heart disease and other diseases of the circulatory system: Secondary | ICD-10-CM

## 2016-12-09 DIAGNOSIS — G894 Chronic pain syndrome: Secondary | ICD-10-CM | POA: Diagnosis present

## 2016-12-09 DIAGNOSIS — D75839 Thrombocytosis, unspecified: Secondary | ICD-10-CM | POA: Diagnosis present

## 2016-12-09 DIAGNOSIS — Z832 Family history of diseases of the blood and blood-forming organs and certain disorders involving the immune mechanism: Secondary | ICD-10-CM

## 2016-12-09 DIAGNOSIS — Z86718 Personal history of other venous thrombosis and embolism: Secondary | ICD-10-CM

## 2016-12-09 DIAGNOSIS — Z91018 Allergy to other foods: Secondary | ICD-10-CM

## 2016-12-09 DIAGNOSIS — F633 Trichotillomania: Secondary | ICD-10-CM | POA: Diagnosis present

## 2016-12-09 DIAGNOSIS — Z9049 Acquired absence of other specified parts of digestive tract: Secondary | ICD-10-CM | POA: Diagnosis not present

## 2016-12-09 DIAGNOSIS — D473 Essential (hemorrhagic) thrombocythemia: Secondary | ICD-10-CM | POA: Diagnosis present

## 2016-12-09 LAB — COMPREHENSIVE METABOLIC PANEL
ALBUMIN: 4.4 g/dL (ref 3.5–5.0)
ALK PHOS: 63 U/L (ref 38–126)
ALT: 16 U/L (ref 14–54)
ANION GAP: 9 (ref 5–15)
AST: 24 U/L (ref 15–41)
BUN: 8 mg/dL (ref 6–20)
CALCIUM: 9.1 mg/dL (ref 8.9–10.3)
CO2: 25 mmol/L (ref 22–32)
Chloride: 106 mmol/L (ref 101–111)
Creatinine, Ser: 0.6 mg/dL (ref 0.44–1.00)
GFR calc Af Amer: >60 mL/min (ref >60)
GFR calc non Af Amer: >60 mL/min (ref >60)
GLUCOSE: 104 mg/dL — AB (ref 65–99)
POTASSIUM: 3.6 mmol/L (ref 3.5–5.1)
SODIUM: 140 mmol/L (ref 135–145)
Total Bilirubin: 2 mg/dL — ABNORMAL HIGH (ref 0.3–1.2)
Total Protein: 7.5 g/dL (ref 6.5–8.1)

## 2016-12-09 LAB — CBC WITH DIFFERENTIAL/PLATELET
Basophils Absolute: 0.1 10*3/uL (ref 0.0–0.1)
Basophils Relative: 0 %
EOS PCT: 2 %
Eosinophils Absolute: 0.3 10*3/uL (ref 0.0–0.7)
HEMATOCRIT: 24.2 % — AB (ref 36.0–46.0)
Hemoglobin: 8.5 g/dL — ABNORMAL LOW (ref 12.0–15.0)
LYMPHS PCT: 9 %
Lymphs Abs: 1.7 10*3/uL (ref 0.7–4.0)
MCH: 31 pg (ref 26.0–34.0)
MCHC: 35.1 g/dL (ref 30.0–36.0)
MCV: 88.3 fL (ref 78.0–100.0)
MONO ABS: 1.6 10*3/uL — AB (ref 0.1–1.0)
MONOS PCT: 9 %
NEUTROS ABS: 15.2 10*3/uL — AB (ref 1.7–7.7)
Neutrophils Relative %: 80 %
Platelets: 577 10*3/uL — ABNORMAL HIGH (ref 150–400)
RBC: 2.74 MIL/uL — ABNORMAL LOW (ref 3.87–5.11)
RDW: 19.9 % — AB (ref 11.5–15.5)
WBC: 18.9 10*3/uL — ABNORMAL HIGH (ref 4.0–10.5)

## 2016-12-09 LAB — RETICULOCYTES
RBC.: 2.74 MIL/uL — ABNORMAL LOW (ref 3.87–5.11)
Retic Count, Absolute: 405.5 10*3/uL — ABNORMAL HIGH (ref 19.0–186.0)
Retic Ct Pct: 14.8 % — ABNORMAL HIGH (ref 0.4–3.1)

## 2016-12-09 MED ORDER — HYDROMORPHONE HCL 1 MG/ML IJ SOLN: 2.0000 mg | INTRAMUSCULAR | Status: DC

## 2016-12-09 MED ORDER — ONDANSETRON HCL 4 MG/2ML IJ SOLN
4.0000 mg | INTRAMUSCULAR | Status: DC | PRN
  Administered 2016-12-09: 4 mg via INTRAVENOUS
  Filled 2016-12-09: qty 2

## 2016-12-09 MED ORDER — DEXTROSE-NACL 5-0.45 % IV SOLN
INTRAVENOUS | Status: DC
  Administered 2016-12-09: 09:00:00 via INTRAVENOUS

## 2016-12-09 MED ORDER — OXYCODONE HCL 5 MG PO TABS
15.0000 mg | ORAL_TABLET | Freq: Once | ORAL | Status: AC
  Administered 2016-12-09: 15 mg via ORAL
  Filled 2016-12-09: qty 3

## 2016-12-09 MED ORDER — DEXTROSE-NACL 5-0.45 % IV SOLN
INTRAVENOUS | Status: DC
  Administered 2016-12-10 – 2016-12-13 (×4): via INTRAVENOUS

## 2016-12-09 MED ORDER — HYDROMORPHONE HCL 1 MG/ML IJ SOLN: 2.0000 mg | INTRAMUSCULAR | Status: AC

## 2016-12-09 MED ORDER — KETOROLAC TROMETHAMINE 30 MG/ML IJ SOLN
30.0000 mg | INTRAMUSCULAR | Status: AC
  Administered 2016-12-09: 30 mg via INTRAVENOUS
  Filled 2016-12-09: qty 1

## 2016-12-09 MED ORDER — DIPHENHYDRAMINE HCL 25 MG PO CAPS
50.0000 mg | ORAL_CAPSULE | Freq: Once | ORAL | Status: AC | PRN
  Administered 2016-12-09: 50 mg via ORAL
  Filled 2016-12-09: qty 2

## 2016-12-09 MED ORDER — HYDROMORPHONE HCL 1 MG/ML IJ SOLN
2.0000 mg | Freq: Once | INTRAMUSCULAR | Status: AC
  Administered 2016-12-09: 2 mg via INTRAVENOUS
  Filled 2016-12-09: qty 2

## 2016-12-09 MED ORDER — HEPARIN SOD (PORK) LOCK FLUSH 100 UNIT/ML IV SOLN
500.0000 [IU] | Freq: Once | INTRAVENOUS | Status: AC
  Administered 2016-12-09: 500 [IU]
  Filled 2016-12-09: qty 5

## 2016-12-09 MED ORDER — HYDROMORPHONE HCL 1 MG/ML IJ SOLN
2.0000 mg | INTRAMUSCULAR | Status: AC
  Administered 2016-12-09: 2 mg via INTRAVENOUS
  Filled 2016-12-09: qty 2

## 2016-12-09 MED ORDER — DIPHENHYDRAMINE HCL 50 MG/ML IJ SOLN
25.0000 mg | Freq: Once | INTRAMUSCULAR | Status: AC
  Administered 2016-12-09: 25 mg via INTRAVENOUS
  Filled 2016-12-09: qty 1

## 2016-12-09 MED ORDER — ONDANSETRON HCL 4 MG/2ML IJ SOLN
4.0000 mg | INTRAMUSCULAR | Status: DC | PRN
  Administered 2016-12-09 – 2016-12-10 (×2): 4 mg via INTRAVENOUS
  Filled 2016-12-09 (×2): qty 2

## 2016-12-09 MED ORDER — DIPHENHYDRAMINE HCL 25 MG PO CAPS
25.0000 mg | ORAL_CAPSULE | Freq: Once | ORAL | Status: AC
  Administered 2016-12-09: 25 mg via ORAL
  Filled 2016-12-09: qty 1

## 2016-12-09 NOTE — ED Triage Notes (Signed)
Pt reports R leg pain for sickle cell. She was seen here earlier today for same. She reports taking oxycontin at 4p with no effect. Pt crying in triage.

## 2016-12-09 NOTE — ED Triage Notes (Signed)
Pt reports she began to have sickle cell pain in her R leg from knee to ankle that began last night. Pt crying in triage.

## 2016-12-09 NOTE — ED Provider Notes (Signed)
[facility] DEPT Provider Note   CSN: [id number] Arrival date & time: 12/09/16  2120    History   Chief Complaint Chief Complaint  Patient presents with  . Sickle Cell Pain Crisis    HPI [name] is a 26 y.o. female.  26 year old female with a history of sickle cell anemia, trichotillomania, depression, and chronic back pain presents to the emergency department for persistent right lower extremity pain. She was seen earlier today for similar symptoms and discharged after receiving multiple boluses of IV Dilaudid. She states that she continues to have a constant, throbbing pain in her right anterior medial leg. She last took oxycodone at 4 PM. She denies any sensation changes, fevers, chest pain, shortness of breath. She has had prior sickle cell crisis pain in her joints; states this feels similar. No hx of trauma or injury.   The history is provided by the patient. No language interpreter was used.  Sickle Cell Pain Crisis    Past Medical History:  Diagnosis Date  . Blood transfusion    "lots"  . Blood transfusion without reported diagnosis   . Chronic back pain    "very severe; have knot in my back; from tight muscle; take RX and exercise for it"  . Depression 01/06/2011  . Exertional dyspnea    "sometimes"  . Genital HSV   . GERD (gastroesophageal reflux disease) 02/17/2011  . Migraines 11/08/11   "@ least twice/month"  . 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  . Mood swings ([place]) 11/08/11   "I go back and forth; real bad"  . Sickle cell anemia ([name])   . Sickle cell anemia with crisis ([place])   . Thrombocytosis ([place]) 11/09/2015  . Trichotillomania    h/o    Patient Active Problem List   Diagnosis Date Noted  . Chronic anticoagulation   . Sickle cell anemia ([place]) 05/19/2016  . History of DVT (deep vein thrombosis) 04/17/2016  . Sickle cell anemia with pain ([place]) 02/16/2016  . Thrombocytosis ([place]) 11/09/2015  . Hyperbilirubinemia 11/09/2015    . Chronic pain 08/04/2015  . Herpes simplex 07/14/2015  . Anemia of chronic disease   . 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 CHOLECYSTECTOMY  05/2010  . DILATION AND CURETTAGE OF UTERUS  02/20/11   S/P miscarriage  . IR GENERIC HISTORICAL  12/23/2015   IR FLUORO GUIDE CV LINE RIGHT 12/23/2015 Jacqulynn Cadet, MD [facility]  . IR GENERIC HISTORICAL  12/23/2015   IR US GUIDE VASC ACCESS RIGHT 12/23/2015 Jacqulynn Cadet, MD [facility]    OB History    Gravida Para Term Preterm AB Living   5 2 2  0 3 2   SAB TAB Ectopic Multiple Live Births   3 0 0 0 2      Obstetric Comments   Miscarried in October 2012 at about 7 weeks       Home Medications    Prior to Admission medications   Medication Sig Start Date End Date Taking? Authorizing Provider  hydroxyurea (HYDREA) 500 MG capsule Take 2 capsules (1,000 mg total) by mouth daily. May take with food to minimize GI side effects. 04/18/16   Tresa Garter, MD  lidocaine (LIDODERM) 5 % Place 1 patch onto the skin daily. Remove & Discard patch within 12 hours or as directed by MD Patient not taking: Reported on 12/09/2016  11/11/16   [name], MD  medroxyPROGESTERone (DEPO-PROVERA) 150 MG/ML injection Inject 1 mL (150 mg total) into the muscle every 3 (three) months. 09/21/16   Shelly Bombard, MD  Menthol-Methyl Salicylate (MUSCLE RUB) 10-15 % CREA Apply 1 application topically as needed for muscle pain. 11/11/16   [name], MD  morphine (MS CONTIN) 60 MG 12 hr tablet Take 60 mg by mouth every 12 (twelve) hours as needed for pain.     [provider]  oxyCODONE (ROXICODONE) 15 MG immediate release tablet Take 15 mg by mouth every 4 (four) hours as needed for pain.     [provider]  rivaroxaban (XARELTO) 20 MG TABS tablet Take 20 mg by mouth daily with supper.      [provider]  valACYclovir (VALTREX) 1000 MG tablet Take 1 tablet (1,000 mg total) by mouth daily. 05/02/16   [name], MD    Family History Family History  Problem Relation Age of Onset  . Sickle cell trait Mother   . Sickle cell trait Father   . Diabetes Maternal Grandmother   . Diabetes Paternal Grandmother   . Hypertension Paternal Grandmother   . Diabetes Maternal Grandfather     Social History Social History  Substance Use Topics  . Smoking status: Former Smoker    Packs/day: 0.25    Years: 1.00    Types: Cigarettes    Quit date: 03/25/2013  . Smokeless tobacco: Never Used  . Alcohol use Yes     Comment: once a month     Allergies   Food and Latex   Review of Systems Review of Systems Ten systems reviewed and are negative for acute change, except as noted in the HPI.    Physical Exam Updated Vital Signs BP 110/78 (BP Location: Right Arm)   Pulse 82   Temp 100.3 F (37.9 C) (Oral)   Resp 18   SpO2 94%   Physical Exam  Constitutional: She is oriented to person, place, and time. She appears well-developed and well-nourished. No distress.  Patient appears uncomfortable, but nontoxic.  HENT:  Head: Normocephalic and atraumatic.  Eyes: Conjunctivae and EOM are normal. No scleral icterus.  Neck: Normal range of motion.  Cardiovascular: Regular rhythm and intact distal pulses.   Mild tachycardia. DP pulses 2+ in the right lower extremity.  Pulmonary/Chest: Effort normal. No respiratory distress. She has no wheezes.  Respirations even and unlabored. Lungs clear to auscultation bilaterally.  Musculoskeletal: Normal range of motion.  Neurological: She is alert and oriented to person, place, and time. She exhibits normal muscle tone. Coordination normal.  GCS 15. Patient moving all extremities.  Skin: Skin is warm and dry. No rash noted. She is not diaphoretic. No erythema. No pallor.  Psychiatric: She has a normal mood and affect.  Her behavior is normal.  Nursing note and vitals reviewed.    ED Treatments / Results  Labs (all labs ordered are listed, but only abnormal results are displayed) Labs Reviewed  CBC - Abnormal; Notable for the following:       Result Value   WBC 16.3 (*)    RBC 2.56 (*)    Hemoglobin 7.7 (*)    HCT 22.3 (*)    RDW 19.7 (*)    Platelets 494 (*)    All other components within normal limits  I-STAT CHEM 8, ED - Abnormal; Notable for the following:    BUN 4 (*)    Glucose, Bld 172 (*)  Hemoglobin 8.5 (*)    HCT 25.0 (*)    All other components within normal limits  I-STAT BETA HCG BLOOD, ED ([facility], [facility], [facility] ONLY)    EKG  EKG Interpretation None       Radiology No results found.  Procedures Procedures (including critical care time)  Medications Ordered in ED Medications  ondansetron (ZOFRAN) injection 4 mg (4 mg Intravenous Given 12/09/16 2349)  dextrose 5 %-0.45 % sodium chloride infusion ( Intravenous New Bag/Given 12/10/16 0009)  HYDROmorphone (DILAUDID) injection 2 mg (not administered)  naloxone (NARCAN) injection 0.4 mg (not administered)    And  sodium chloride flush (NS) 0.9 % injection 9 mL (not administered)  ondansetron (ZOFRAN) injection 4 mg (not administered)  diphenhydrAMINE (BENADRYL) capsule 25-50 mg (not administered)    Or  diphenhydrAMINE (BENADRYL) 25 mg in sodium chloride 0.9 % 50 mL IVPB (not administered)  HYDROmorphone (DILAUDID) 1 mg/mL PCA injection (not administered)  hydroxyurea (HYDREA) capsule 1,000 mg (not administered)  morphine (MS CONTIN) 12 hr tablet 60 mg (not administered)  rivaroxaban (XARELTO) tablet 20 mg (not administered)  senna-docusate (Senokot-S) tablet 1 tablet (not administered)  polyethylene glycol (MIRALAX / GLYCOLAX) packet 17 g (not administered)  ketorolac (TORADOL) 30 MG/ML injection 30 mg (not administered)  ketorolac (TORADOL) 30 MG/ML injection 30 mg (30 mg Intravenous Given 12/09/16 2349)  HYDROmorphone  (DILAUDID) injection 2 mg (2 mg Intravenous Given 12/09/16 2348)  diphenhydrAMINE (BENADRYL) capsule 50 mg (50 mg Oral Given 12/09/16 2348)  HYDROmorphone (DILAUDID) injection 2 mg (2 mg Intravenous Given 12/10/16 0238)     Initial Impression / Assessment and Plan / ED Course  I have reviewed the triage vital signs and the nursing notes.  Pertinent labs & imaging results that were available during my care of the patient were reviewed by me and considered in my medical decision making (see chart for details).     26 year old female presents to the emergency department for evaluation of persistent pain to her right lower extremity. She presented to the emergency department yesterday for similar symptoms and was discharged following IV in control. Patient reports persistent symptoms upon returning home. She denies chest pain and shortness of breath. No fevers over 100.23F. Patient given additional doses of IV Dilaudid. She reports persistent pain, currently rated 7/10. She believe she requires admission for pain control. Case discussed with Dr. Alcario [facility] to [facility] who [facility] admit.   Final Clinical Impressions(s) / ED Diagnoses   Final diagnoses:  Sickle cell pain crisis [facility])    New Prescriptions New Prescriptions   No medications on file     Beverely Pace 12/10/16 0407    Jola Schmidt, MD 12/10/16 [phone]

## 2016-12-09 NOTE — Discharge Instructions (Signed)
Follow up with sickle cell clinic as needed. Continue home medications as previously prescribed. Return to ED for worsening pain, chest pain, trouble breathing, coughing up blood, leg swelling, fevers.

## 2016-12-09 NOTE — ED Provider Notes (Signed)
[facility] DEPT Provider Note   CSN: [id number] Arrival date & time: 12/09/16  0809     History   Chief Complaint Chief Complaint  Patient presents with  . Sickle Cell Pain Crisis  . Leg Pain    HPI [name] is a 26 y.o. female.  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 She reports this feels like her typical sickle cell pain crisis. She continues to have the pain despite the use of her home morphine and oxycodone. Ambulating exacerbates her symptoms. She denies any chest pain, shortness of breath, hemoptysis, leg swelling, numbness, tingling, injury, loss of consciousness, abdominal pain.  Past Medical History:  Diagnosis Date  . Blood transfusion    "lots"  . Blood transfusion without reported diagnosis   . Chronic back pain    "very severe; have knot in my back; from tight muscle; take RX and exercise for it"  . Depression 01/06/2011  . Exertional dyspnea    "sometimes"  . Genital HSV   . GERD (gastroesophageal reflux disease) 02/17/2011  . Migraines 11/08/11   "@ least twice/month"  . 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  . Mood swings ([place]) 11/08/11   "I go back and forth; real bad"  . Sickle cell anemia ([name])   . Sickle cell anemia with crisis ([place])   . Thrombocytosis ([place]) 11/09/2015  . Trichotillomania    h/o    Patient Active Problem List   Diagnosis Date Noted  . Chronic anticoagulation   . Sickle cell anemia ([place]) 05/19/2016  . History of DVT (deep vein thrombosis) 04/17/2016  . Sickle cell anemia with pain ([place]) 02/16/2016  . Thrombocytosis ([place]) 11/09/2015  . Hyperbilirubinemia 11/09/2015  . Chronic pain 08/04/2015  . Herpes simplex 07/14/2015  . Anemia of chronic disease   . 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 CHOLECYSTECTOMY  05/2010  . DILATION AND CURETTAGE OF UTERUS  02/20/11   S/P miscarriage  . IR GENERIC HISTORICAL  12/23/2015   IR FLUORO GUIDE CV LINE RIGHT 12/23/2015 Jacqulynn Cadet, MD [facility]  . IR GENERIC HISTORICAL  12/23/2015   IR US GUIDE VASC ACCESS RIGHT 12/23/2015 Jacqulynn Cadet, MD [facility]    OB History    Gravida Para Term Preterm AB Living   5 2 2  0 3 2   SAB TAB Ectopic Multiple Live Births   3 0 0 0 2      Obstetric Comments   Miscarried in October 2012 at about 7 weeks       Home Medications    Prior to Admission medications   Medication Sig Start Date End Date Taking? Authorizing Provider  hydroxyurea (HYDREA) 500 MG capsule Take 2 capsules (1,000 mg total) by mouth daily. May take with food to minimize GI side effects. 04/18/16  Yes Tresa Garter, MD  Menthol-Methyl Salicylate (MUSCLE RUB) 10-15 % CREA Apply 1 application topically as needed for muscle pain. 11/11/16  Yes [name], MD  morphine (MS CONTIN) 60 MG 12 hr tablet Take 60 mg by mouth every 12 (twelve) hours as needed for pain.    Yes [provider]  oxyCODONE (ROXICODONE) 15 MG immediate release tablet Take 15 mg by mouth every 4 (  four) hours as needed for pain.    Yes [provider]  rivaroxaban (XARELTO) 20 MG TABS tablet Take 20 mg by mouth daily with supper.    Yes [provider]  valACYclovir (VALTREX) 1000 MG tablet Take 1 tablet (1,000 mg total) by mouth daily. 05/02/16  Yes [name], MD  lidocaine (LIDODERM) 5 % Place 1 patch onto the skin daily. Remove & Discard patch within 12 hours or as directed by MD Patient not taking: Reported on 12/09/2016 11/11/16   [name], MD  medroxyPROGESTERone (DEPO-PROVERA) 150 MG/ML injection Inject 1 mL (150 mg total) into the muscle every 3 (three) months. 09/21/16   Shelly Bombard, MD    Family History Family History  Problem Relation Age of Onset   . Sickle cell trait Mother   . Sickle cell trait Father   . Diabetes Maternal Grandmother   . Diabetes Paternal Grandmother   . Hypertension Paternal Grandmother   . Diabetes Maternal Grandfather     Social History Social History  Substance Use Topics  . Smoking status: Former Smoker    Packs/day: 0.25    Years: 1.00    Types: Cigarettes    Quit date: 03/25/2013  . Smokeless tobacco: Never Used  . Alcohol use Yes     Comment: once a month     Allergies   Food and Latex   Review of Systems Review of Systems  Constitutional: Negative for appetite change, chills and fever.  HENT: Negative for ear pain, rhinorrhea, sneezing and sore throat.   Eyes: Negative for photophobia and visual disturbance.  Respiratory: Negative for cough, chest tightness, shortness of breath and wheezing.   Cardiovascular: Negative for chest pain and palpitations.  Gastrointestinal: Negative for abdominal pain, blood in stool, constipation, diarrhea, nausea and vomiting.  Genitourinary: Negative for dysuria, hematuria and urgency.  Musculoskeletal: Positive for arthralgias. Negative for myalgias.  Skin: Negative for rash.  Neurological: Negative for dizziness, weakness and light-headedness.     Physical Exam Updated Vital Signs BP (!) 102/56   Pulse 99   Temp 98.4 F (36.9 C)   Resp 18   SpO2 99%   Physical Exam  Constitutional: She appears well-developed and well-nourished. No distress.  HENT:  Head: Normocephalic and atraumatic.  Nose: Nose normal.  Eyes: Conjunctivae and EOM are normal. Left eye exhibits no discharge. No scleral icterus.  Neck: Normal range of motion. Neck supple.  Cardiovascular: Normal rate, regular rhythm, normal heart sounds and intact distal pulses.  Exam reveals no gallop and no friction rub.   No murmur heard. Pulmonary/Chest: Effort normal and breath sounds normal. No respiratory distress.  Abdominal: Soft. Bowel sounds are normal. She exhibits no  distension. There is no tenderness. There is no guarding.  Musculoskeletal: Normal range of motion. She exhibits tenderness (Right lower extremity tenderness from knee to ankle). She exhibits no edema.  No edema or color change noted. 2+ DP pulses bilaterally. No obvious deformity noted.  Neurological: She is alert. She exhibits normal muscle tone. Coordination normal.  Skin: Skin is warm and dry. No rash noted.  Psychiatric: She has a normal mood and affect.  Nursing note and vitals reviewed.    ED Treatments / Results  Labs (all labs ordered are listed, but only abnormal results are displayed) Labs Reviewed  COMPREHENSIVE METABOLIC PANEL - Abnormal; Notable for the following:       Result Value   Glucose, Bld 104 (*)    Total Bilirubin 2.0 (*)  All other components within normal limits  CBC WITH DIFFERENTIAL/PLATELET - Abnormal; Notable for the following:    WBC 18.9 (*)    RBC 2.74 (*)    Hemoglobin 8.5 (*)    HCT 24.2 (*)    RDW 19.9 (*)    Platelets 577 (*)    Neutro Abs 15.2 (*)    Monocytes Absolute 1.6 (*)    All other components within normal limits  RETICULOCYTES - Abnormal; Notable for the following:    Retic Ct Pct 14.8 (*)    RBC. 2.74 (*)    Retic Count, Absolute 405.5 (*)    All other components within normal limits    EKG  EKG Interpretation None       Radiology No results found.  Procedures Procedures (including critical care time)  Medications Ordered in ED Medications  dextrose 5 %-0.45 % sodium chloride infusion ( Intravenous New Bag/Given 12/09/16 0906)  HYDROmorphone (DILAUDID) injection 2 mg (not administered)    Or  HYDROmorphone (DILAUDID) injection 2 mg (not administered)  ondansetron (ZOFRAN) injection 4 mg (4 mg Intravenous Given 12/09/16 0923)  HYDROmorphone (DILAUDID) injection 2 mg (2 mg Intravenous Given 12/09/16 0924)    Or  HYDROmorphone (DILAUDID) injection 2 mg ( Subcutaneous See Alternative 12/09/16 0924)  HYDROmorphone  (DILAUDID) injection 2 mg (2 mg Intravenous Given 12/09/16 1002)    Or  HYDROmorphone (DILAUDID) injection 2 mg ( Subcutaneous See Alternative 12/09/16 1002)  HYDROmorphone (DILAUDID) injection 2 mg (2 mg Intravenous Given 12/09/16 1039)    Or  HYDROmorphone (DILAUDID) injection 2 mg ( Subcutaneous See Alternative 12/09/16 1039)  diphenhydrAMINE (BENADRYL) injection 25 mg (25 mg Intravenous Given 12/09/16 0924)  ketorolac (TORADOL) 30 MG/ML injection 30 mg (30 mg Intravenous Given 12/09/16 0925)  diphenhydrAMINE (BENADRYL) capsule 25 mg (25 mg Oral Given 12/09/16 1038)  oxyCODONE (Oxy IR/ROXICODONE) immediate release tablet 15 mg (15 mg Oral Given 12/09/16 1222)  heparin lock flush 100 unit/mL (500 Units Intracatheter Given 12/09/16 1223)     Initial Impression / Assessment and Plan / ED Course  I have reviewed the triage vital signs and the nursing notes.  Pertinent labs & imaging results that were available during my care of the patient were reviewed by me and considered in my medical decision making (see chart for details).     Patient presents with right leg pain consistent with previous sickle cell pain crises. She denies any injury. On physical exam the extremity is tender to palpation with no edema or color change noted. She reports compliance with her home several toe for her DVT in her arm. She denies any chest pain, shortness of breath, hemoptysis, fever. Afebrile, not tachycardic or hypoxic. Low suspicion for acute chest syndrome considering symptoms.  6546: Pain is currently >10/10. Will start on pain medication protocol, Dilaudid, toradol, Zofran and benadryl and reassess.  1030: Patient reports improvement of pain to 6/10 after 2 doses of Dilaudid and 1 dose of Toradol. Will give benadryl PO as patient is asking for additional dose. Is resting comfortably on my exam and eating a sandwich, texting.  1115: Patient reports pain still 6/10 after third dose Dilaudid. Asking for one more  dose IV Dilaudid. I gave patient options of being admitted, getting PO oxycodone before d/c. States she does not want to be admitted.  1200: Spoke to sickle cell clinic who recommends that we give patient one dose of home oxycodone discharge patient considering that her pain is 6/10. I told this and the  patient and she agreed. Remains resting comfortably on bed. She appears stable for discharge. Strict return precautions given for severe or worsening symptoms.  Final Clinical Impressions(s) / ED Diagnoses   Final diagnoses:  Right leg pain  Sickle cell pain crisis [facility])    New Prescriptions New Prescriptions   No medications on file     Delia Heady, PA-C 12/09/16 [place], DO 12/09/16 1228

## 2016-12-10 ENCOUNTER — Inpatient Hospital Stay (HOSPITAL_COMMUNITY): Payer: Medicare Other

## 2016-12-10 DIAGNOSIS — M79609 Pain in unspecified limb: Secondary | ICD-10-CM

## 2016-12-10 DIAGNOSIS — Z8249 Family history of ischemic heart disease and other diseases of the circulatory system: Secondary | ICD-10-CM | POA: Diagnosis not present

## 2016-12-10 DIAGNOSIS — D5700 Hb-SS disease with crisis, unspecified: Secondary | ICD-10-CM | POA: Diagnosis not present

## 2016-12-10 DIAGNOSIS — G894 Chronic pain syndrome: Secondary | ICD-10-CM | POA: Diagnosis present

## 2016-12-10 DIAGNOSIS — Z7901 Long term (current) use of anticoagulants: Secondary | ICD-10-CM | POA: Diagnosis not present

## 2016-12-10 DIAGNOSIS — K219 Gastro-esophageal reflux disease without esophagitis: Secondary | ICD-10-CM | POA: Diagnosis present

## 2016-12-10 DIAGNOSIS — Z833 Family history of diabetes mellitus: Secondary | ICD-10-CM | POA: Diagnosis not present

## 2016-12-10 DIAGNOSIS — Z91040 Latex allergy status: Secondary | ICD-10-CM | POA: Diagnosis not present

## 2016-12-10 DIAGNOSIS — D473 Essential (hemorrhagic) thrombocythemia: Secondary | ICD-10-CM | POA: Diagnosis not present

## 2016-12-10 DIAGNOSIS — Z9049 Acquired absence of other specified parts of digestive tract: Secondary | ICD-10-CM | POA: Diagnosis not present

## 2016-12-10 DIAGNOSIS — Z832 Family history of diseases of the blood and blood-forming organs and certain disorders involving the immune mechanism: Secondary | ICD-10-CM | POA: Diagnosis not present

## 2016-12-10 DIAGNOSIS — Z87891 Personal history of nicotine dependence: Secondary | ICD-10-CM | POA: Diagnosis not present

## 2016-12-10 DIAGNOSIS — D638 Anemia in other chronic diseases classified elsewhere: Secondary | ICD-10-CM | POA: Diagnosis present

## 2016-12-10 DIAGNOSIS — Z86718 Personal history of other venous thrombosis and embolism: Secondary | ICD-10-CM

## 2016-12-10 DIAGNOSIS — F633 Trichotillomania: Secondary | ICD-10-CM | POA: Diagnosis present

## 2016-12-10 DIAGNOSIS — Z91018 Allergy to other foods: Secondary | ICD-10-CM | POA: Diagnosis not present

## 2016-12-10 LAB — DIFFERENTIAL
BASOS PCT: 0 %
Basophils Absolute: 0.1 10*3/uL (ref 0.0–0.1)
EOS PCT: 2 %
Eosinophils Absolute: 0.3 10*3/uL (ref 0.0–0.7)
Lymphocytes Relative: 25 %
Lymphs Abs: 3.5 10*3/uL (ref 0.7–4.0)
MONO ABS: 1.5 10*3/uL — AB (ref 0.1–1.0)
Monocytes Relative: 11 %
Neutro Abs: 8.6 10*3/uL — ABNORMAL HIGH (ref 1.7–7.7)
Neutrophils Relative %: 62 %

## 2016-12-10 LAB — CBC
HEMATOCRIT: 22.3 % — AB (ref 36.0–46.0)
HEMOGLOBIN: 7.7 g/dL — AB (ref 12.0–15.0)
MCH: 30.1 pg (ref 26.0–34.0)
MCHC: 34.5 g/dL (ref 30.0–36.0)
MCV: 87.1 fL (ref 78.0–100.0)
Platelets: 494 10*3/uL — ABNORMAL HIGH (ref 150–400)
RBC: 2.56 MIL/uL — ABNORMAL LOW (ref 3.87–5.11)
RDW: 19.7 % — AB (ref 11.5–15.5)
WBC: 16.3 10*3/uL — ABNORMAL HIGH (ref 4.0–10.5)

## 2016-12-10 LAB — I-STAT CHEM 8, ED
BUN: 4 mg/dL — AB (ref 6–20)
CREATININE: 0.6 mg/dL (ref 0.44–1.00)
Calcium, Ion: 1.19 mmol/L (ref 1.15–1.40)
Chloride: 102 mmol/L (ref 101–111)
GLUCOSE: 172 mg/dL — AB (ref 65–99)
HEMATOCRIT: 25 % — AB (ref 36.0–46.0)
Hemoglobin: 8.5 g/dL — ABNORMAL LOW (ref 12.0–15.0)
POTASSIUM: 3.8 mmol/L (ref 3.5–5.1)
Sodium: 139 mmol/L (ref 135–145)
TCO2: 28 mmol/L (ref 0–100)

## 2016-12-10 LAB — I-STAT BETA HCG BLOOD, ED (NOT ORDERABLE): I-stat hCG, quantitative: <5 m[IU]/mL (ref <5)

## 2016-12-10 LAB — RETICULOCYTES
RBC.: 2.54 MIL/uL — ABNORMAL LOW (ref 3.87–5.11)
RETIC CT PCT: 14 % — AB (ref 0.4–3.1)
Retic Count, Absolute: 355.6 10*3/uL — ABNORMAL HIGH (ref 19.0–186.0)

## 2016-12-10 MED ORDER — SODIUM CHLORIDE 0.9% FLUSH: 9.0000 mL | INTRAVENOUS | Status: DC | PRN

## 2016-12-10 MED ORDER — ONDANSETRON HCL 4 MG/2ML IJ SOLN: 4.0000 mg | Freq: Four times a day (QID) | INTRAMUSCULAR | Status: DC | PRN

## 2016-12-10 MED ORDER — POLYETHYLENE GLYCOL 3350 17 G PO PACK: 17.0000 g | PACK | Freq: Every day | ORAL | Status: DC | PRN

## 2016-12-10 MED ORDER — SODIUM CHLORIDE 0.9% FLUSH: 10.0000 mL | INTRAVENOUS | Status: DC | PRN

## 2016-12-10 MED ORDER — SODIUM CHLORIDE 0.9 % IV SOLN
25.0000 mg | INTRAVENOUS | Status: DC | PRN
  Filled 2016-12-10: qty 0.5

## 2016-12-10 MED ORDER — LIDOCAINE 5 % EX PTCH
1.0000 | MEDICATED_PATCH | CUTANEOUS | Status: DC
  Administered 2016-12-10 – 2016-12-13 (×4): 1 via TRANSDERMAL
  Filled 2016-12-10 (×5): qty 1

## 2016-12-10 MED ORDER — NALOXONE HCL 0.4 MG/ML IJ SOLN: 0.4000 mg | INTRAMUSCULAR | Status: DC | PRN

## 2016-12-10 MED ORDER — RIVAROXABAN 20 MG PO TABS
20.0000 mg | ORAL_TABLET | Freq: Every day | ORAL | Status: DC
  Administered 2016-12-10 – 2016-12-13 (×4): 20 mg via ORAL
  Filled 2016-12-10 (×4): qty 1

## 2016-12-10 MED ORDER — HYDROXYUREA 500 MG PO CAPS
1000.0000 mg | ORAL_CAPSULE | Freq: Every day | ORAL | Status: DC
  Administered 2016-12-10 – 2016-12-14 (×5): 1000 mg via ORAL
  Filled 2016-12-10 (×5): qty 2

## 2016-12-10 MED ORDER — KETOROLAC TROMETHAMINE 30 MG/ML IJ SOLN
30.0000 mg | Freq: Four times a day (QID) | INTRAMUSCULAR | Status: DC
  Administered 2016-12-10 – 2016-12-14 (×16): 30 mg via INTRAVENOUS
  Filled 2016-12-10 (×17): qty 1

## 2016-12-10 MED ORDER — HYDROMORPHONE 1 MG/ML IV SOLN
INTRAVENOUS | Status: DC
  Administered 2016-12-10: 9.6 mg via INTRAVENOUS
  Administered 2016-12-10: 6 mg via INTRAVENOUS
  Administered 2016-12-10: 07:00:00 via INTRAVENOUS
  Administered 2016-12-10: 4.8 mg via INTRAVENOUS
  Administered 2016-12-10: 1 mg via INTRAVENOUS
  Administered 2016-12-10 – 2016-12-11 (×3): via INTRAVENOUS
  Administered 2016-12-11: 12 mg via INTRAVENOUS
  Administered 2016-12-11: 9.4 mg via INTRAVENOUS
  Administered 2016-12-11: 9.56 mg via INTRAVENOUS
  Administered 2016-12-11: 10.39 mg via INTRAVENOUS
  Administered 2016-12-11: 9 mg via INTRAVENOUS
  Administered 2016-12-12: 4.2 mg via INTRAVENOUS
  Administered 2016-12-12: 6 mg via INTRAVENOUS
  Administered 2016-12-12: 1 mg via INTRAVENOUS
  Administered 2016-12-12: 12.6 mg via INTRAVENOUS
  Administered 2016-12-12: 0 mg via INTRAVENOUS
  Administered 2016-12-12: 7.8 mg via INTRAVENOUS
  Administered 2016-12-13: 5.4 mg via INTRAVENOUS
  Administered 2016-12-13: 12.6 mg via INTRAVENOUS
  Administered 2016-12-13: 4.8 mg via INTRAVENOUS
  Administered 2016-12-13: 3 mg via INTRAVENOUS
  Administered 2016-12-13: 8.86 mg via INTRAVENOUS
  Administered 2016-12-13: 7.2 mg via INTRAVENOUS
  Administered 2016-12-13 – 2016-12-14 (×3): via INTRAVENOUS
  Administered 2016-12-14: 0.6 mg via INTRAVENOUS
  Administered 2016-12-14: 12.6 mg via INTRAVENOUS
  Administered 2016-12-14: 16.2 mg via INTRAVENOUS
  Administered 2016-12-14: 2.4 mg via INTRAVENOUS
  Filled 2016-12-10 (×8): qty 25

## 2016-12-10 MED ORDER — HYDROMORPHONE HCL 1 MG/ML IJ SOLN
2.0000 mg | Freq: Once | INTRAMUSCULAR | Status: AC
  Administered 2016-12-10: 2 mg via INTRAVENOUS
  Filled 2016-12-10: qty 2

## 2016-12-10 MED ORDER — DIPHENHYDRAMINE HCL 25 MG PO CAPS: 25.0000 mg | ORAL_CAPSULE | ORAL | Status: DC | PRN

## 2016-12-10 MED ORDER — MORPHINE SULFATE ER 30 MG PO TBCR
60.0000 mg | EXTENDED_RELEASE_TABLET | Freq: Two times a day (BID) | ORAL | Status: DC
  Administered 2016-12-10 – 2016-12-14 (×9): 60 mg via ORAL
  Filled 2016-12-10 (×9): qty 2

## 2016-12-10 MED ORDER — SENNOSIDES-DOCUSATE SODIUM 8.6-50 MG PO TABS
1.0000 | ORAL_TABLET | Freq: Two times a day (BID) | ORAL | Status: DC
  Administered 2016-12-10 – 2016-12-14 (×8): 1 via ORAL
  Filled 2016-12-10 (×9): qty 1

## 2016-12-10 NOTE — H&P (Addendum)
History and Physical    [name] QHU:[id number] DOB: 1990/12/20 DOA: 12/09/2016  PCP: Ricke Hey, MD  Patient coming from: Home  I have personally briefly reviewed patient's old medical records in [place]  Chief Complaint: Sickle cell pain crisis  HPI: [name] is a 26 y.o. 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  Patient presents to the ED for persistent RLE pain.  Seen earlier today in ED for same symptoms, discharged after multiple boluses of IV dilaudid.  Pain continues and persists.  Pain is severe.  Home oxy doesn't help.  Returns to ED.  Only one possible missed dose of Xarelto recently, no lower extremity swelling.  ED Course: Given dilaudid load.   Review of Systems: As per HPI otherwise 10 point review of systems negative.   Past Medical History:  Diagnosis Date  . Blood transfusion    "lots"  . Blood transfusion without reported diagnosis   . Chronic back pain    "very severe; have knot in my back; from tight muscle; take RX and exercise for it"  . Depression 01/06/2011  . Exertional dyspnea    "sometimes"  . Genital HSV   . GERD (gastroesophageal reflux disease) 02/17/2011  . Migraines 11/08/11   "@ least twice/month"  . 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  . Mood swings ([place]) 11/08/11   "I go back and forth; real bad"  . Sickle cell anemia ([name])   . Sickle cell anemia with crisis ([place])   . Thrombocytosis ([place]) 11/09/2015  . Trichotillomania    h/o    Past Surgical History:  Procedure Laterality Date  . CHOLECYSTECTOMY  05/2010  . DILATION AND CURETTAGE OF UTERUS  02/20/11   S/P miscarriage  . IR GENERIC HISTORICAL  12/23/2015   IR FLUORO GUIDE CV LINE RIGHT 12/23/2015 Jacqulynn Cadet, MD [facility]  . IR GENERIC HISTORICAL  12/23/2015   IR US GUIDE VASC ACCESS RIGHT 12/23/2015 Jacqulynn Cadet, MD [facility]     reports that she quit smoking about 3 years  ago. Her smoking use included Cigarettes. She has a 0.25 pack-year smoking history. She has never used smokeless tobacco. She reports that she drinks alcohol. She reports that she does not use drugs.  Allergies  Allergen Reactions  . Food Hives and Other (See Comments)    Pt is allergic to carrots.    . Latex Rash    Family History  Problem Relation Age of Onset  . Sickle cell trait Mother   . Sickle cell trait Father   . Diabetes Maternal Grandmother   . Diabetes Paternal Grandmother   . Hypertension Paternal Grandmother   . Diabetes Maternal Grandfather      Prior to Admission medications   Medication Sig Start Date End Date Taking? Authorizing Provider  hydroxyurea (HYDREA) 500 MG capsule Take 2 capsules (1,000 mg total) by mouth daily. May take with food to minimize GI side effects. 04/18/16   Tresa Garter, MD  lidocaine (LIDODERM) 5 % Place 1 patch onto the skin daily. Remove & Discard patch within 12 hours or as directed by MD Patient not taking: Reported on 12/09/2016 11/11/16   [name], MD  medroxyPROGESTERone (DEPO-PROVERA) 150 MG/ML injection Inject 1 mL (150 mg total) into the muscle every 3 (three) months. 09/21/16   Shelly Bombard, MD  Menthol-Methyl Salicylate (MUSCLE RUB) 10-15 % CREA Apply 1 application topically as needed for muscle  pain. 11/11/16   [name], MD  morphine (MS CONTIN) 60 MG 12 hr tablet Take 60 mg by mouth every 12 (twelve) hours as needed for pain.     [provider]  oxyCODONE (ROXICODONE) 15 MG immediate release tablet Take 15 mg by mouth every 4 (four) hours as needed for pain.     [provider]  rivaroxaban (XARELTO) 20 MG TABS tablet Take 20 mg by mouth daily with supper.     [provider]  valACYclovir (VALTREX) 1000 MG tablet Take 1 tablet (1,000 mg total) by mouth daily. 05/02/16   [name], MD    Physical Exam: Vitals:   12/10/16 0250 12/10/16 0300 12/10/16 0330  12/10/16 0400  BP: 110/78 111/81 109/81 106/63  Pulse: 82 91 85 72  Resp: 18   16  Temp:      TempSrc:      SpO2: 94% 92% 93% 94%  Weight:    90.7 kg (200 lb)  Height:    5\' 1"  (1.549 m)    Constitutional: NAD, calm, comfortable Eyes: PERRL, lids and conjunctivae normal ENMT: Mucous membranes are moist. Posterior pharynx clear of any exudate or lesions.Normal dentition.  Neck: normal, supple, no masses, no thyromegaly Respiratory: clear to auscultation bilaterally, no wheezing, no crackles. Normal respiratory effort. No accessory muscle use.  Cardiovascular: Regular rate and rhythm, no murmurs / rubs / gallops. No extremity edema. 2+ pedal pulses. No carotid bruits.  Abdomen: no tenderness, no masses palpated. No hepatosplenomegaly. Bowel sounds positive.  Musculoskeletal: no clubbing / cyanosis. No joint deformity upper and lower extremities. Good ROM, no contractures. Normal muscle tone.  Skin: no rashes, lesions, ulcers. No induration Neurologic: CN 2-12 grossly intact. Sensation intact, DTR normal. Strength 5/5 in all 4.  Psychiatric: Normal judgment and insight. Alert and oriented x 3. Normal mood.    Labs on Admission: I have personally reviewed following labs and imaging studies  CBC:  Recent Labs Lab 12/05/16 0416 12/09/16 0215 12/09/16 0921 12/10/16 0236  WBC 10.7* 16.3* 18.9*  --   NEUTROABS 6.6  --  15.2*  --   HGB 8.1* 7.7* 8.5* 8.5*  HCT 23.6* 22.3* 24.2* 25.0*  MCV 88.4 87.1 88.3  --   PLT 563* 494* 577*  --    Basic Metabolic Panel:  Recent Labs Lab 12/05/16 0416 12/09/16 0921 12/10/16 0236  NA 137 140 139  K 4.1 3.6 3.8  CL 105 106 102  CO2 26 25  --   GLUCOSE 102* 104* 172*  BUN 11 8 4*  CREATININE 0.72 0.60 0.60  CALCIUM 8.3* 9.1  --    GFR: Estimated Creatinine Clearance: 111.3 mL/min (by C-G formula based on SCr of 0.6 mg/dL). Liver Function Tests:  Recent Labs Lab 12/05/16 0416 12/09/16 0921  AST 23 24  ALT 14 16  ALKPHOS 58 63    BILITOT 2.0* 2.0*  PROT 7.0 7.5  ALBUMIN 4.2 4.4   No results for input(s): LIPASE, AMYLASE in the last 168 hours. No results for input(s): AMMONIA in the last 168 hours. Coagulation Profile: No results for input(s): INR, PROTIME in the last 168 hours. Cardiac Enzymes: No results for input(s): CKTOTAL, CKMB, CKMBINDEX, TROPONINI in the last 168 hours. BNP (last 3 results) No results for input(s): PROBNP in the last 8760 hours. HbA1C: No results for input(s): HGBA1C in the last 72 hours. CBG: No results for input(s): GLUCAP in the last 168 hours. Lipid Profile: No results for input(s):  CHOL, HDL, LDLCALC, TRIG, CHOLHDL, LDLDIRECT in the last 72 hours. Thyroid Function Tests: No results for input(s): TSH, T4TOTAL, FREET4, T3FREE, THYROIDAB in the last 72 hours. Anemia Panel:  Recent Labs  12/09/16 0921  RETICCTPCT 14.8*   Urine analysis:    Component Value Date/Time   COLORURINE YELLOW 11/24/2016 1440   APPEARANCEUR CLEAR 11/24/2016 1440   APPEARANCEUR Clear 05/06/2012 1546   LABSPEC 1.010 11/24/2016 1440   LABSPEC 1.011 05/06/2012 1546   PHURINE 6.0 11/24/2016 1440   GLUCOSEU NEGATIVE 11/24/2016 1440   GLUCOSEU Negative 05/06/2012 1546   HGBUR LARGE (A) 11/24/2016 1440   HGBUR negative 01/08/2009 1329   BILIRUBINUR NEGATIVE 11/24/2016 1440   BILIRUBINUR Negative 05/06/2012 1546   KETONESUR NEGATIVE 11/24/2016 1440   PROTEINUR NEGATIVE 11/24/2016 1440   UROBILINOGEN 2.0 (H) 10/26/2015 0817   NITRITE NEGATIVE 11/24/2016 1440   LEUKOCYTESUR NEGATIVE 11/24/2016 1440   LEUKOCYTESUR Negative 05/06/2012 1546    Radiological Exams on Admission: No results found.  EKG: Independently reviewed.  Assessment/Plan Principal Problem:   Sickle cell anemia with pain ([name]) Active Problems:   History of DVT (deep vein thrombosis)    1. HGB SS disease with crisis - 1. Sickle cell pathway 2. PCA per her standing orders in FYI tab 3. Scheduled max dose toradol 4. IVF: D5  half at 125 cc/hr 5. Continue hydrea 2. H/O DVT - 1. continue Xarelto 2. No recent missed doses, but given Leg pain and history, will get [place] to r/o DVT though this seems less likely.  DVT prophylaxis: Xarelto Code Status: Full Family Communication: No family in room Disposition Plan: Home after admit Consults called: None Admission status: Admit to inpatient - inpatient status due to use of PCA   GARDNER, [facility] Pager [phone]  If 7AM-7PM, please contact day team taking care of patient [email or web address] Password [facility]  12/10/2016, 4:25 AM

## 2016-12-10 NOTE — Progress Notes (Signed)
Patient ID: [name], female   DOB: 05-20-1990, 26 y.o.   MRN: [id number] Pt admitted this morning with Sickle Cell Crisis. She had some asymptomatic low BP associated with IV bolus doses of Dilaudid. This has resolved since starting the PCA. I have reviewed the PCA settings and other orders. Will add topical Lidoderm.  Duplex U/S shows no DVT. Continue Xarelto at pre-hospital dose. Re-assess pain control tomorrow. Discontinue Telemetry.  MATTHEWS,MICHELLE A.

## 2016-12-10 NOTE — Progress Notes (Signed)
VASCULAR LAB PRELIMINARY  PRELIMINARY  PRELIMINARY  PRELIMINARY  Right lower extremity venous duplex completed.    Preliminary report:  Right:  No evidence of DVT, superficial thrombosis, or Baker's cyst.  [name], RVTS 12/10/2016, 9:11 AM

## 2016-12-10 NOTE — ED Notes (Addendum)
Notified PA,Kelly pt. I-stat Chem 8 results hemoglobin 8.5.

## 2016-12-11 DIAGNOSIS — G894 Chronic pain syndrome: Secondary | ICD-10-CM

## 2016-12-11 DIAGNOSIS — D638 Anemia in other chronic diseases classified elsewhere: Secondary | ICD-10-CM

## 2016-12-11 DIAGNOSIS — Z7901 Long term (current) use of anticoagulants: Secondary | ICD-10-CM

## 2016-12-11 DIAGNOSIS — D5700 Hb-SS disease with crisis, unspecified: Principal | ICD-10-CM

## 2016-12-11 DIAGNOSIS — D473 Essential (hemorrhagic) thrombocythemia: Secondary | ICD-10-CM

## 2016-12-11 MED ORDER — HYDROMORPHONE HCL-NACL 0.5-0.9 MG/ML-% IV SOSY
1.0000 mg | PREFILLED_SYRINGE | INTRAVENOUS | Status: AC
  Administered 2016-12-12 (×2): 1 mg via INTRAVENOUS
  Filled 2016-12-11 (×2): qty 2

## 2016-12-11 MED ORDER — ASPIRIN 81 MG PO CHEW
81.0000 mg | CHEWABLE_TABLET | Freq: Every day | ORAL | Status: DC
  Administered 2016-12-11 – 2016-12-14 (×4): 81 mg via ORAL
  Filled 2016-12-11 (×4): qty 1

## 2016-12-11 MED ORDER — HYDROMORPHONE HCL-NACL 0.5-0.9 MG/ML-% IV SOSY
1.0000 mg | PREFILLED_SYRINGE | Freq: Once | INTRAVENOUS | Status: AC
  Administered 2016-12-11: 1 mg via INTRAVENOUS

## 2016-12-11 NOTE — Progress Notes (Signed)
Hydromorphone 1mg /mL PCA injection witness waste w? Maretta Bees RN.  Kizzie Ide, RN

## 2016-12-11 NOTE — Progress Notes (Signed)
Hydromorphone 1mg /mL PCA injection witnessed waste w/ Maretta Bees RN.  Kizzie Ide, RN
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
## 2016-12-11 NOTE — Progress Notes (Signed)
Pt experienced Port line occlusion throughout the day. MD aware, IV team RN asked to come evaluate line and no problems found.  This RN noticed IV infusion rate had been changed from 150 mL/h to 10 mL/h.  When pt confronted, she stated "the other nurse must of done that, I haven't touched it." Assigned RN also noticed that when the IV pump read occlusion the Port infusion line had been clamped, pt states she did not do this. Teaching done w/ pt that she should not touch pump settings or IV infusion line. Will continue to monitor.   Kizzie Ide, RN

## 2016-12-12 LAB — CBC WITH DIFFERENTIAL/PLATELET
BASOS PCT: 1 %
Basophils Absolute: 0.1 10*3/uL (ref 0.0–0.1)
Eosinophils Absolute: 0.7 10*3/uL (ref 0.0–0.7)
Eosinophils Relative: 6 %
HCT: 21.3 % — ABNORMAL LOW (ref 36.0–46.0)
HEMOGLOBIN: 7.5 g/dL — AB (ref 12.0–15.0)
Lymphocytes Relative: 23 %
Lymphs Abs: 2.7 10*3/uL (ref 0.7–4.0)
MCH: 31 pg (ref 26.0–34.0)
MCHC: 35.2 g/dL (ref 30.0–36.0)
MCV: 88 fL (ref 78.0–100.0)
Monocytes Absolute: 1.1 10*3/uL — ABNORMAL HIGH (ref 0.1–1.0)
Monocytes Relative: 9 %
NEUTROS PCT: 61 %
Neutro Abs: 7.1 10*3/uL (ref 1.7–7.7)
Platelets: 510 10*3/uL — ABNORMAL HIGH (ref 150–400)
RBC: 2.42 MIL/uL — AB (ref 3.87–5.11)
RDW: 19.2 % — ABNORMAL HIGH (ref 11.5–15.5)
WBC: 11.6 10*3/uL — AB (ref 4.0–10.5)

## 2016-12-12 LAB — RETICULOCYTES
RBC.: 2.42 MIL/uL — AB (ref 3.87–5.11)
RETIC CT PCT: 16.7 % — AB (ref 0.4–3.1)
Retic Count, Absolute: 404.1 10*3/uL — ABNORMAL HIGH (ref 19.0–186.0)

## 2016-12-12 NOTE — Care Management Note (Signed)
Case Management Note  Patient Details  Name: TIMERA WINDT MRN: [id number] Date of Birth: [birth year]  Subjective/Objective:        Sickle cell crisis            Action/Plan: Date:  December 12 2016  Chart reviewed for concurrent status and case management needs.  Will continue to follow patient progress.  Discharge Planning: following for needs  Expected discharge date: [id number]  Velva Harman, BSN, [place], [place]   Expected Discharge Date:                  Expected Discharge Plan:  Home/Self Care  In-House Referral:     Discharge planning Services  CM Consult  Post Acute Care Choice:    Choice offered to:     DME Arranged:    DME Agency:     HH Arranged:    HH Agency:     Status of Service:  In process, will continue to follow  If discussed at Long Length of Stay Meetings, dates discussed:    Additional Comments:  Leeroy Cha, RN 12/12/2016, 9:13 AM

## 2016-12-12 NOTE — Progress Notes (Signed)
SICKLE CELL SERVICE PROGRESS NOTE  [name] KKX:[id number] DOB: 1991-02-23 DOA: 12/09/2016 PCP: Nancy Hey, MD  Assessment/Plan: Active Problems:   Chronic pain   Thrombocytosis ([place])   Hb-SS disease with vaso-occlusive crisis ([name])   Chronic anticoagulation  1. Hb SS with crisis: Will continue PCA at current dose a Continue Toradol and Lidoderm transdermal. 2. Chronic Pain Syndrome: Continue MS Contin and 60 mg BID.  3. Chronic Anticoagulation: Continue Xarelto. 4. Anemia of Chronic Disease:Hb at baseline and reticulocytosis robust. Continue Hydrea. Re-assess Hb/Hct and reticulocytes tomorrow. 5. Gait Abnormality: Secondary to pain but improved today.  6. Constipation: Has not had a BM since prior to admission. Receiving Senakot-S and encouraged to request MiraLAX.   Code Status: Full Code Family Communication: N/A Disposition Plan: Not yet ready for discharge  [place].  Pager [phone]. If 7PM-7AM, please contact night-coverage.  12/12/2016, 3:00 PM  LOS: 2 days   Interim History: Pt reports that her pain is currently at 7/10 and still localized to her right leg. She reports that she was able to obtain some sleep this morning which had not occurred for the past several days. She  has used 38.2 mg of Dilaudid with 84/62: Demands/deliveries in the last 24 hours. She reports that unlike yesterday, the Lidoderm transdermal has been helping pain.   Consultants:  None  Procedures:  None  Antibiotics:  None    Objective: Vitals:   12/12/16 0433 12/12/16 0644 12/12/16 0800 12/12/16 1213  BP:  111/64    Pulse:  93    Resp: 14  12 14   Temp:      TempSrc:      SpO2: 93% 92% 96% 96%  Weight:      Height:       Weight change:  No intake or output data in the 24 hours ending 12/12/16 1500   Physical Exam General: Alert, awake, oriented x3, in less distress than yesterday.   HEENT: /AT PEERL, EOMI, anicteric. Neck: Trachea midline,  no  masses, no thyromegal,y no JVD, no carotid bruit OROPHARYNX:  Moist, No exudate/ erythema/lesions.  Heart: Regular rate and rhythm, without murmurs, rubs, gallops, PMI non-displaced, no heaves or thrills on palpation.  Lungs: Clear to auscultation, no wheezing or rhonchi noted. No increased vocal fremitus resonant to percussion.  Abdomen: Soft, nontender, nondistended, positive bowel sounds, no masses no hepatosplenomegaly noted.  Neuro: No focal neurological deficits noted cranial nerves II through XII grossly intact. Strength at baseline in bilateral upper and lower extremities. Musculoskeletal: No warmth swelling or erythema around joints, no spinal tenderness noted. Psychiatric: Patient alert and oriented x3, poor insight into her condition and disease management. She has good cognition with good recent to remote recall. Affect calmer today than yesterday.    Data Reviewed: Basic Metabolic Panel:  Recent Labs Lab 12/09/16 0921 12/10/16 0236  NA 140 139  K 3.6 3.8  CL 106 102  CO2 25  --   GLUCOSE 104* 172*  BUN 8 4*  CREATININE 0.60 0.60  CALCIUM 9.1  --    Liver Function Tests:  Recent Labs Lab 12/09/16 0921  AST 24  ALT 16  ALKPHOS 63  BILITOT 2.0*  PROT 7.5  ALBUMIN 4.4   No results for input(s): LIPASE, AMYLASE in the last 168 hours. No results for input(s): AMMONIA in the last 168 hours. CBC:  Recent Labs Lab 12/09/16 0215 12/09/16 0921 12/10/16 0236 12/10/16 1220 12/12/16 0344  WBC 16.3* 18.9*  --   --  11.6*  NEUTROABS  --  15.2*  --  8.6* 7.1  HGB 7.7* 8.5* 8.5*  --  7.5*  HCT 22.3* 24.2* 25.0*  --  21.3*  MCV 87.1 88.3  --   --  88.0  PLT 494* 577*  --   --  510*   Cardiac Enzymes: No results for input(s): CKTOTAL, CKMB, CKMBINDEX, TROPONINI in the last 168 hours. BNP (last 3 results) No results for input(s): BNP in the last 8760 hours.  ProBNP (last 3 results) No results for input(s): PROBNP in the last 8760 hours.  CBG: No results for  input(s): GLUCAP in the last 168 hours.  No results found for this or any previous visit (from the past 240 hour(s)).   Studies: Dg Chest 2 View  Result Date: 11/24/2016 CLINICAL DATA:  Back and chest pain, cough for 3 days. History of sickle cell. EXAM: CHEST  2 VIEW COMPARISON:  Chest radiograph June 26, 2016 FINDINGS: Cardiac silhouette is mildly enlarged, mediastinal silhouette is unremarkable. No pleural effusion focal consolidation. No pneumothorax. Single lumen RIGHT chest Port-A-Cath with catheter components projecting below the port. No pneumothorax. 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 H-shaped vertebral bodies. Surgical clips in the included right abdomen compatible with cholecystectomy. IMPRESSION: Mild cardiomegaly, no acute pulmonary process. Chronic changes of sickle cell anemia. Electronically Signed   By: Elon Alas M.D.   On: 11/24/2016 14:18    Scheduled Meds: . aspirin  81 mg Oral Daily  . HYDROmorphone   Intravenous Q4H  . hydroxyurea  1,000 mg Oral Daily  . ketorolac  30 mg Intravenous Q6H  . lidocaine  1 patch Transdermal Q24H  . morphine  60 mg Oral Q12H  . rivaroxaban  20 mg Oral Q supper  . senna-docusate  1 tablet Oral BID   Continuous Infusions: . dextrose 5 % and 0.45% NaCl 10 mL/hr at 12/12/16 0942  . diphenhydrAMINE (BENADRYL) IVPB(SICKLE CELL ONLY)      Active Problems:   Chronic pain   Thrombocytosis ([name])   Hb-SS disease with vaso-occlusive crisis ([name])   Chronic anticoagulation     In excess of 25 minutes spent during this visit. Greater than 50% involved face to face contact with the patient for assessment, counseling and coordination of care.

## 2016-12-13 NOTE — Progress Notes (Addendum)
SICKLE CELL SERVICE PROGRESS NOTE  MARTY SADLOWSKI MWU:[id number] DOB: 06-Feb-1991 DOA: 12/09/2016 PCP: Ricke Hey, MD  Assessment/Plan: Active Problems:   Chronic pain   Thrombocytosis ([place])   Hb-SS disease with vaso-occlusive crisis ([name])   Chronic anticoagulation  1. Hb SS with crisis: Schedule oxycodone, continue PCA, Toradol and Lidoderm transdermal. 2. Chronic Pain Syndrome: Continue MS Contin 3. Anemia of Chronic Disease: Hb at baseline with adequate reticulocytosis. Continue Hydrea. 4. Chronic Anticoagulation: Continue Xarelto. 5. Thrombocytosis: Continue ASA 6. Gait Abnormality: Improved as pain has decreased. 7. Constipation: Still no BM but she did not request laxative. Encouraged use of laxative.   Code Status: Full Code Family Communication: N/A Disposition Plan: Anticipate discharge tomorrow  Pawel Soules A.  Pager [phone]. If 7PM-7AM, please contact night-coverage.  12/13/2016, 10:26 AM  LOS: 3 days   Interim History: Pt reports feeling much better to day.  She reports pain at 6/10 and localized to RLE. She feels that she is able to walk better and feels that she should be ready t ogo home by tomorrow with which I am in agreement. Still has not had a BM  Consultants:  None  Procedures:  None  Antibiotics:  None    Objective: Vitals:   12/13/16 0227 12/13/16 0549 12/13/16 0627 12/13/16 0752  BP: 117/86  95/77   Pulse: 82  (!) 105   Resp: 14 10 16 12   Temp: 98.2 F (36.8 C)  98.5 F (36.9 C)   TempSrc: Oral  Oral   SpO2: 94% 92% 97% 91%  Weight: 94.2 kg (207 lb 10.8 oz)     Height:       Weight change:  No intake or output data in the 24 hours ending 12/13/16 1026   Physical Exam General: Alert, awake, oriented x3, in no acute distress.  HEENT: [place]/AT PEERL, EOMI, aniceric Neck: Trachea midline,  no masses, no thyromegal,y no JVD, no carotid bruit OROPHARYNX:  Moist, No exudate/ erythema/lesions.  Heart: Regular rate and  rhythm, without murmurs, rubs, gallops, PMI non-displaced, no heaves or thrills on palpation.  Lungs: Clear to auscultation, no wheezing or rhonchi noted. No increased vocal fremitus resonant to percussion.  Abdomen: Soft, nontender, nondistended, positive bowel sounds, no masses no hepatosplenomegaly noted. Neuro: No focal neurological deficits noted cranial nerves II through XII grossly intact. Strength at baseline in bilateral upper and lower extremities. Musculoskeletal: No warmth swelling or erythema around joints, no spinal tenderness noted. Psychiatric: Patient alert and oriented x3, good insight and cognition, good recent to remote recall. Normal affect.     Data Reviewed: Basic Metabolic Panel:  Recent Labs Lab 12/09/16 0921 12/10/16 0236  NA 140 139  K 3.6 3.8  CL 106 102  CO2 25  --   GLUCOSE 104* 172*  BUN 8 4*  CREATININE 0.60 0.60  CALCIUM 9.1  --    Liver Function Tests:  Recent Labs Lab 12/09/16 0921  AST 24  ALT 16  ALKPHOS 63  BILITOT 2.0*  PROT 7.5  ALBUMIN 4.4   No results for input(s): LIPASE, AMYLASE in the last 168 hours. No results for input(s): AMMONIA in the last 168 hours. CBC:  Recent Labs Lab 12/09/16 0215 12/09/16 0921 12/10/16 0236 12/10/16 1220 12/12/16 0344  WBC 16.3* 18.9*  --   --  11.6*  NEUTROABS  --  15.2*  --  8.6* 7.1  HGB 7.7* 8.5* 8.5*  --  7.5*  HCT 22.3* 24.2* 25.0*  --  21.3*  MCV 87.1  88.3  --   --  88.0  PLT 494* 577*  --   --  510*   Cardiac Enzymes: No results for input(s): CKTOTAL, CKMB, CKMBINDEX, TROPONINI in the last 168 hours. BNP (last 3 results) No results for input(s): BNP in the last 8760 hours.  ProBNP (last 3 results) No results for input(s): PROBNP in the last 8760 hours.  CBG: No results for input(s): GLUCAP in the last 168 hours.  No results found for this or any previous visit (from the past 240 hour(s)).   Studies: Dg Chest 2 View  Result Date: 11/24/2016 CLINICAL DATA:  Back and  chest pain, cough for 3 days. History of sickle cell. EXAM: CHEST  2 VIEW COMPARISON:  Chest radiograph June 26, 2016 FINDINGS: Cardiac silhouette is mildly enlarged, mediastinal silhouette is unremarkable. No pleural effusion focal consolidation. No pneumothorax. Single lumen RIGHT chest Port-A-Cath with catheter components projecting below the port. No pneumothorax. 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 H-shaped vertebral bodies. Surgical clips in the included right abdomen compatible with cholecystectomy. IMPRESSION: Mild cardiomegaly, no acute pulmonary process. Chronic changes of sickle cell anemia. Electronically Signed   By: Elon Alas M.D.   On: 11/24/2016 14:18    Scheduled Meds: . aspirin  81 mg Oral Daily  . HYDROmorphone   Intravenous Q4H  . hydroxyurea  1,000 mg Oral Daily  . ketorolac  30 mg Intravenous Q6H  . lidocaine  1 patch Transdermal Q24H  . morphine  60 mg Oral Q12H  . rivaroxaban  20 mg Oral Q supper  . senna-docusate  1 tablet Oral BID   Continuous Infusions: . dextrose 5 % and 0.45% NaCl 10 mL/hr at 12/13/16 0942  . diphenhydrAMINE (BENADRYL) IVPB(SICKLE CELL ONLY)      Active Problems:   Chronic pain   Thrombocytosis ([name])   Hb-SS disease with vaso-occlusive crisis ([name])   Chronic anticoagulation    In excess of 25 minutes spent during this visit. Greater than 50% involved face to face contact with the patient for assessment, counseling and coordination of care.

## 2016-12-14 MED ORDER — HEPARIN SOD (PORK) LOCK FLUSH 100 UNIT/ML IV SOLN
500.0000 [IU] | Freq: Once | INTRAVENOUS | Status: DC
  Filled 2016-12-14: qty 5

## 2016-12-14 MED ORDER — ASPIRIN 81 MG PO CHEW: 81.0000 mg | CHEWABLE_TABLET | Freq: Every day | ORAL | 0 refills | Status: DC

## 2016-12-14 NOTE — Discharge Summary (Signed)
[name] MRN: [id number] DOB/AGE: 10-10-1990 26 y.o.  Admit date: 12/09/2016 Discharge date: 12/14/2016  Primary Care Physician:  Nancy Hey, MD   Discharge Diagnoses:   Patient Active Problem List   Diagnosis Date Noted  . Major depression, chronic 01/06/2011    Priority: High  . Chronic anticoagulation   . Sickle cell anemia ([place]) 05/19/2016  . History of DVT (deep vein thrombosis) 04/17/2016  . Thrombocytosis ([place]) 11/09/2015  . Hyperbilirubinemia 11/09/2015  . Chronic pain 08/04/2015  . Herpes simplex 07/14/2015  . Anemia of chronic disease   . 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     Latex Rash      Medication List    TAKE these medications   aspirin 81 MG chewable tablet Chew 1 tablet (81 mg total) by mouth daily.   hydroxyurea 500 MG capsule Commonly known as:  HYDREA Take 2 capsules (1,000 mg total) by mouth daily. May take with food to minimize GI side effects.   lidocaine 5 % Commonly known as:  LIDODERM Place 1 patch onto the skin daily. Remove & Discard patch within 12 hours or as directed by MD   medroxyPROGESTERone 150 MG/ML injection Commonly known as:  DEPO-PROVERA Inject 1 mL (150 mg total) into the muscle every 3 (three) months.   morphine 60 MG 12 hr tablet Commonly known as:  MS CONTIN Take 60 mg by mouth every 12 (twelve) hours as needed for pain.   MUSCLE RUB 10-15 % Crea Apply 1 application topically as needed for muscle pain.   oxyCODONE 15 MG immediate release tablet Commonly known as:  ROXICODONE Take 15 mg by mouth every 4 (four) hours as needed for pain.   rivaroxaban 20 MG Tabs tablet Commonly known as:  XARELTO Take 20 mg by mouth daily with supper.   valACYclovir 1000 MG tablet Commonly known as:  VALTREX Take 1 tablet (1,000 mg total) by  mouth daily.         Consults:    SIGNIFICANT DIAGNOSTIC STUDIES:  Dg Chest 2 View  Result Date: 11/24/2016 CLINICAL DATA:  Back and chest pain, cough for 3 days. History of sickle cell. EXAM: CHEST  2 VIEW COMPARISON:  Chest radiograph June 26, 2016 FINDINGS: Cardiac silhouette is mildly enlarged, mediastinal silhouette is unremarkable. No pleural effusion focal consolidation. No pneumothorax. Single lumen RIGHT chest Port-A-Cath with catheter components projecting below the port. No pneumothorax. 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 H-shaped vertebral bodies. Surgical clips in the included right abdomen compatible with cholecystectomy. IMPRESSION: Mild cardiomegaly, no acute pulmonary process. Chronic changes of sickle cell anemia. Electronically Signed   By: [name] M.D.   On: 11/24/2016 14:18     RLE Venous Duplex: - No evidence of deep vein or superficial thrombosis involving the   right lower extremity and left common femoral vein. - No evidence of Baker&'s cyst on the right.   No results found for this or any previous visit (from the past 240 hour(s)).  BRIEF ADMITTING H & P: [name] is a 26 y.o. 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  Patient presents to the ED for persistent RLE pain.  Seen earlier today in ED for same symptoms, discharged after multiple boluses of IV dilaudid.  Pain continues and persists.  Pain is severe.  Home oxy doesn't help.  Returns to ED.  Only one possible missed dose of Xarelto recently, no lower extremity swelling.    Hospital Course:  Present on Admission: . Thrombocytosis ([place]) . Chronic pain  This is an Opiate tolerant patient with Hb SS admitted with sickle cell crisis and pain predominantly in the RLE. Pain was managed with Dilaudid via PCA, Toradol, Lidoderm Transdermal and IVF. A venous duplex ultrasound was performed to r/o DVT and was  found to be negative. Pt also had a gait abnormality as a result of the pain in her leg. As the pain improved transition was made to Oxycodone. At the time of discharge, she reported that her pain was "okay" despite her use of moe than 40 mg of IV Dilaudid in the last 24 hours. I discussed this with patient who requested to be discharged anyway as she had a Doctor's appointment for her infant son this afternoon. Her gait abnormality has resolved and she has no current medical risk to require further hospitalization.   With regard to her anemia of chronic disease her Hb remained stable throughout hospital course and she maintained a robust reticulocytosis thus Hydrea was continued uninterrupted.   Pt also has a chronic thrombocytosis and was started on ASA 81 mg daily.   All other medications for chronic conditions were continued without interruption including Xarelto for chronic anticoagulation and Valtrex for Herpes Suppression.  Patient gives conflicting information about availability of both her Valtrex and Lidoderm transdermal thus I have asked her to speak with her PMD about re-ordering Valtrex if she does not have a refill. Additionally I called the Pharmacy ([facility]) about the Lidoderm transdermal patches. They report that has not yet picked them up as it required prior authorization however she states that she has. Again I will ask her to speak with her PMD about prescribing if necessary.      Disposition and Follow-up: She is to follow up with her PMD- Dr,. [name] as needed or as scheduled.   Discharge Instructions    Activity as tolerated - No restrictions    Complete by:  As directed    Diet general    Complete by:  As directed       DISCHARGE EXAM:  General: Alert, awake, oriented x3, in no apparent distress.  HEENT: [place]/AT PEERL, EOMI, anicteric Neck: Trachea midline, no masses, no thyromegal,y no JVD, no carotid bruit OROPHARYNX: Moist, No exudate/ erythema/lesions.   Heart: Regular rate and rhythm, without murmurs, rubs, gallops or S3. PMI non-displaced. Exam reveals no decreased pulses. Pulmonary/Chest: Normal effort. Breath sounds normal. No. Apnea. Clear to auscultation,no stridor,  no wheezing and no rhonchi noted. No respiratory distress and no tenderness noted. Abdomen: Soft, nontender, nondistended, normal bowel sounds, no masses no hepatosplenomegaly noted. No fluid wave and no ascites. There is no guarding or rebound. Neuro: Alert and oriented to person, place and time. Normal motor skills, Displays no atrophy or tremors and exhibits normal muscle tone.  No focal neurological deficits noted cranial nerves II through XII grossly intact. No sensory deficit noted. Strength at baseline in bilateral upper and lower extremities. Gait normal. Musculoskeletal: No warmth swelling or erythema around joints, no spinal tenderness noted. Psychiatric: Patient alert and oriented x3, good insight and cognition, good recent to remote recall. Skin: Skin is warm and dry. No bruising, no ecchymosis and no rash noted. Pt is not diaphoretic. No erythema. No pallor  Blood pressure 102/60, pulse 98, temperature 98.5 F (36.9 C), temperature source Oral, resp. rate 13, height 5\' 4"  (1.626 m), weight 90.7 kg (200 lb), SpO2 98 %, not currently breastfeeding.  No results for input(s): NA, K, CL, CO2, GLUCOSE, BUN, CREATININE, CALCIUM, MG, PHOS in the last 72 hours. No results for input(s): AST, ALT, ALKPHOS, BILITOT, PROT, ALBUMIN in the last 72 hours. No results for input(s): LIPASE, AMYLASE in the last 72 hours.  Recent Labs  12/12/16 0344  WBC 11.6*  NEUTROABS 7.1  HGB 7.5*  HCT 21.3*  MCV 88.0  PLT 510*     Total time spent including face to face and decision making was greater than 30 minutes  Signed: Ryonna Cimini A. 12/14/2016, 11:53 AM

## 2016-12-14 NOTE — Care Management Important Message (Signed)
Important Message  Patient Details  Name: [name] MRN: [id number] Date of Birth: July 31, 1990   Medicare Important Message Given:  Yes    Kerin Salen 12/14/2016, 11:54 AMImportant Message  Patient Details  Name: [name] MRN: [id number] Date of Birth: 09/12/1990   Medicare Important Message Given:  Yes    Kerin Salen 12/14/2016, 11:54 AM

## 2016-12-15 DIAGNOSIS — Z79891 Long term (current) use of opiate analgesic: Secondary | ICD-10-CM | POA: Diagnosis not present

## 2016-12-16 NOTE — Telephone Encounter (Signed)
See note

## 2017-01-10 ENCOUNTER — Emergency Department (HOSPITAL_BASED_OUTPATIENT_CLINIC_OR_DEPARTMENT_OTHER)
Admission: EM | Admit: 2017-01-10 | Discharge: 2017-01-11 | Disposition: A | Discharge status: Home / Self Care | Payer: Medicare Other | Attending: Emergency Medicine | Admitting: Emergency Medicine

## 2017-01-10 ENCOUNTER — Encounter (HOSPITAL_BASED_OUTPATIENT_CLINIC_OR_DEPARTMENT_OTHER): Payer: Self-pay | Admitting: *Deleted

## 2017-01-10 DIAGNOSIS — Z7982 Long term (current) use of aspirin: Secondary | ICD-10-CM | POA: Insufficient documentation

## 2017-01-10 DIAGNOSIS — Z79899 Other long term (current) drug therapy: Secondary | ICD-10-CM | POA: Diagnosis not present

## 2017-01-10 DIAGNOSIS — Z87891 Personal history of nicotine dependence: Secondary | ICD-10-CM | POA: Diagnosis not present

## 2017-01-10 DIAGNOSIS — D5700 Hb-SS disease with crisis, unspecified: Secondary | ICD-10-CM | POA: Diagnosis not present

## 2017-01-10 IMAGING — DX DG CHEST 2V
2 series · 2 of 2 positions shown · non-contrast
Comparison: Radiographs January 03, 2016.

CLINICAL DATA: Chest pain, sickle cell crisis.

EXAM:
CHEST  2 VIEW

[chest lat]
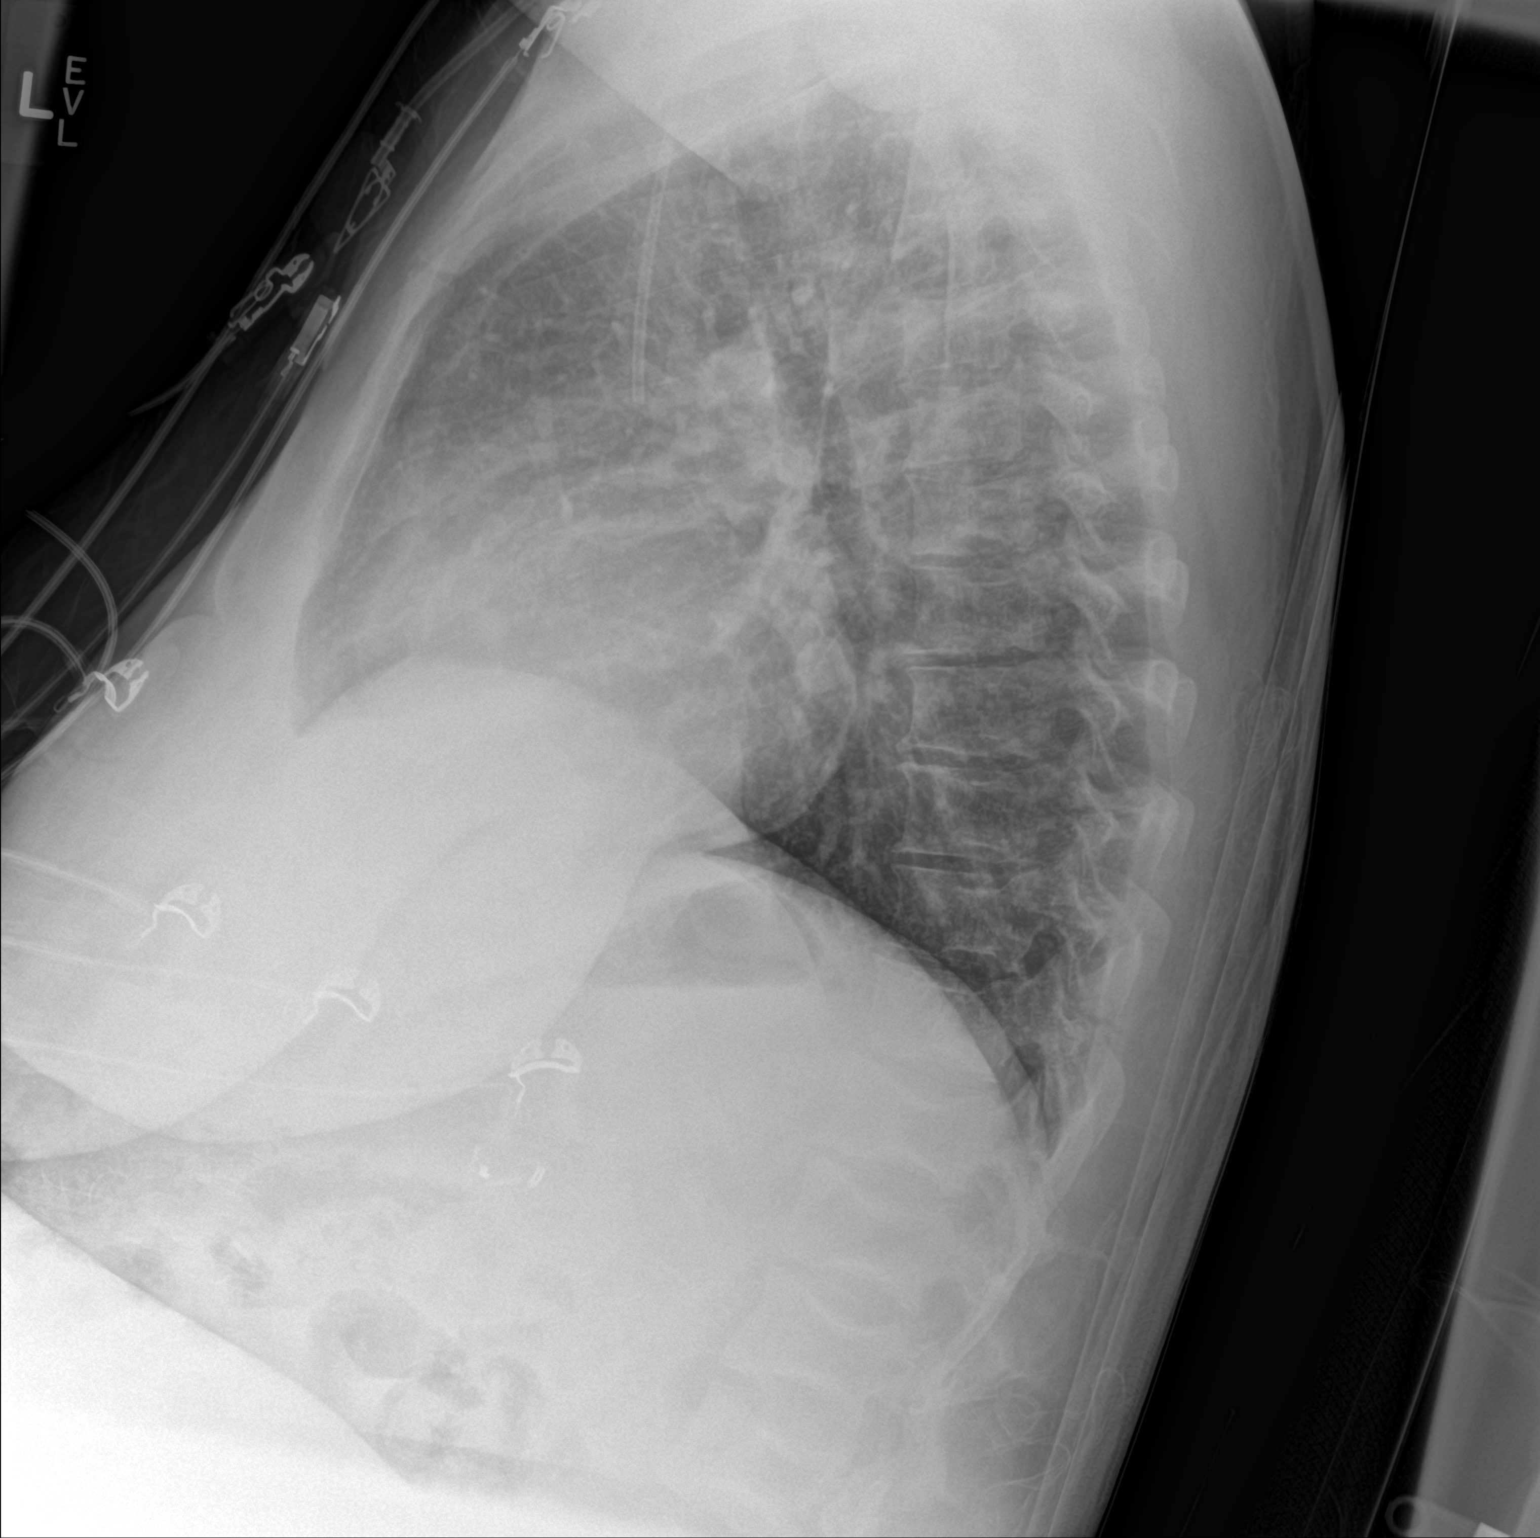

[chest ap]
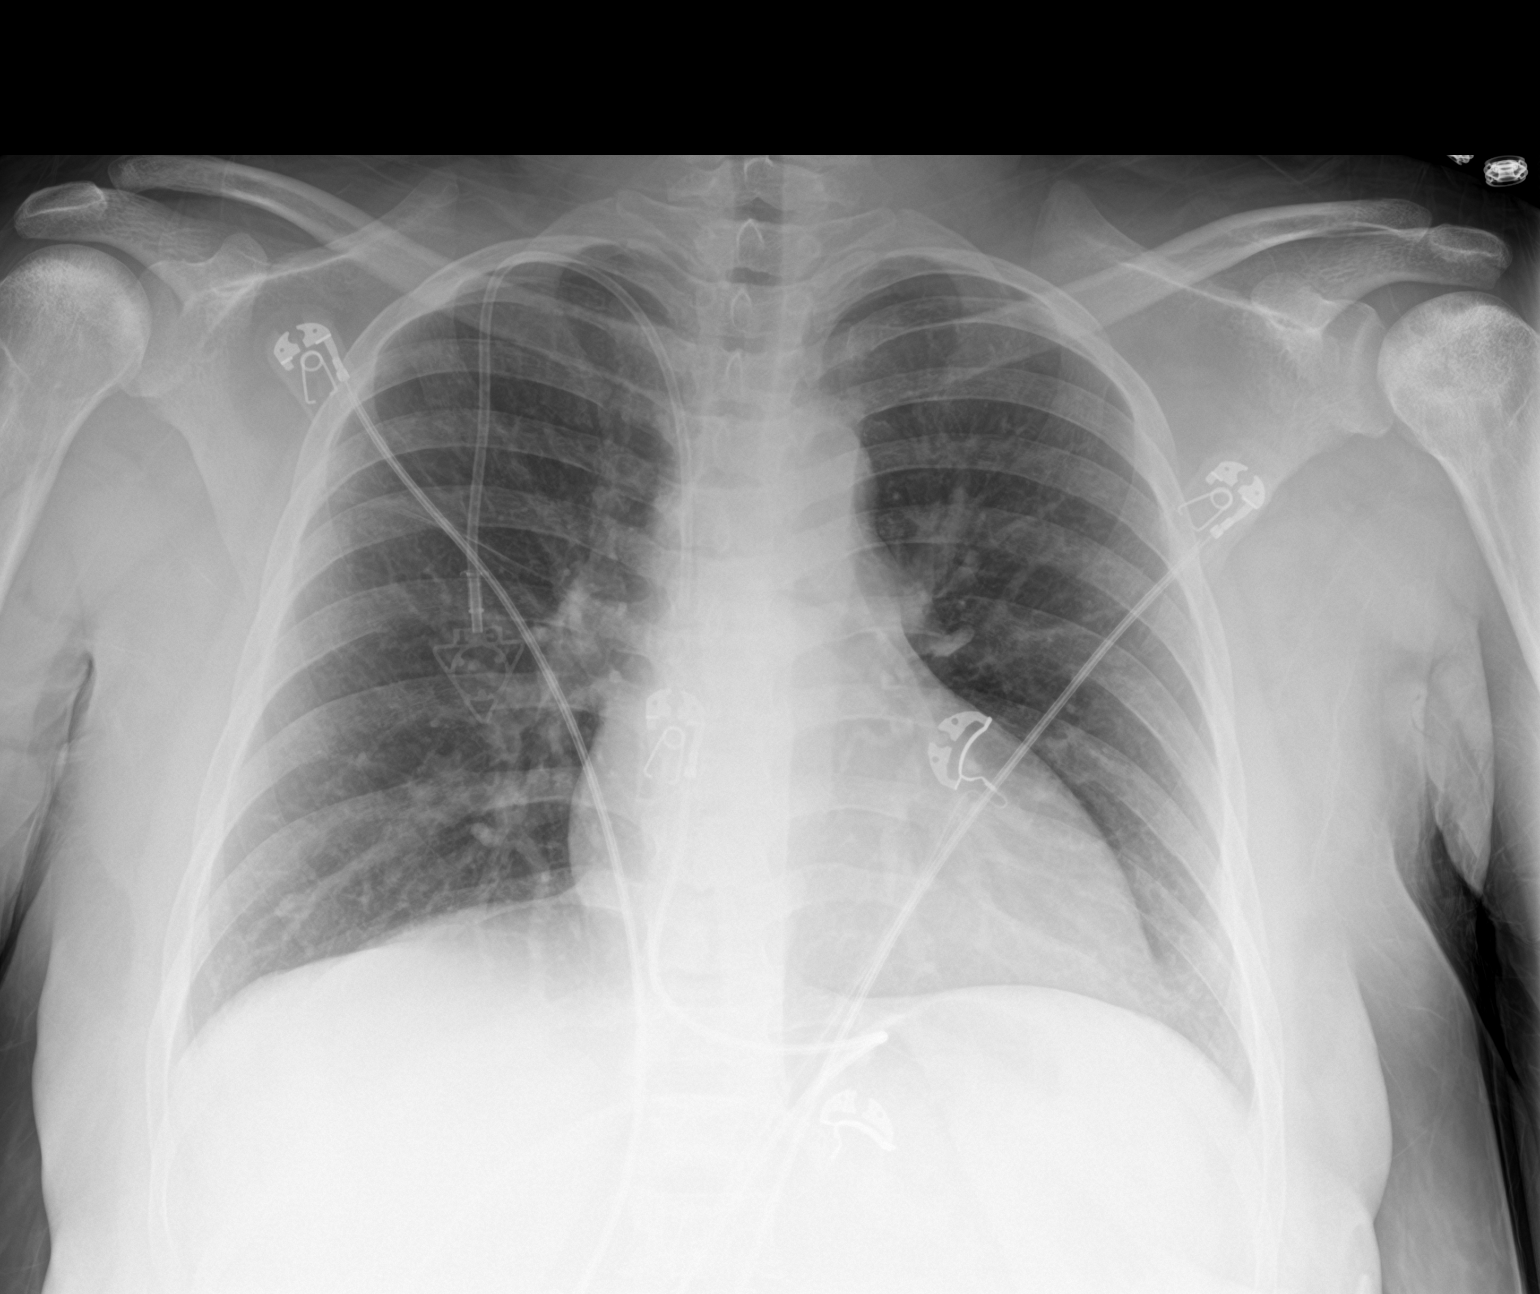

[2 of 2 positions shown; findings below may reference images not displayed]

FINDINGS: The heart size and mediastinal contours are within normal limits.
Both lungs are clear. No pneumothorax or pleural effusion is noted.
Right internal jugular Port-A-Cath is unchanged in position.
Skeletal changes are noted consistent with history of sickle cell
disease.
IMPRESSION: No active cardiopulmonary disease.

## 2017-01-10 IMAGING — CT CT ABD-PELV W/ CM
3 of 9 series · 10 of 46 positions shown, 16 images · IV contrast (ISOVUE 370)
Comparison: 07/18/2012 and 11/30/2011

CLINICAL DATA: 25-year-old with sickle cell anemia. Patient
complains of back and flank pain.

EXAM:
CT ANGIOGRAPHY CHEST
CT ABDOMEN AND PELVIS WITH CONTRAST
TECHNIQUE: Multidetector CT imaging of the chest was performed using the
standard protocol during bolus administration of intravenous
contrast. Multiplanar CT image reconstructions and MIPs were
obtained to evaluate the vascular anatomy. Multidetector CT imaging
of the abdomen and pelvis was performed using the standard protocol
during bolus administration of intravenous contrast.
CONTRAST:  130 mL Isovue 370
There was an issue with contrast leaking from the Port-A-Cath. As a
result, contrast was given on 2 occasions.

[Series 10: abd/pel with · axial · 0.76mm/px · z∈[+793,+1063]mm · 4 of 92 slices shown, 9 images]
[im 19/92  soft-tissue]
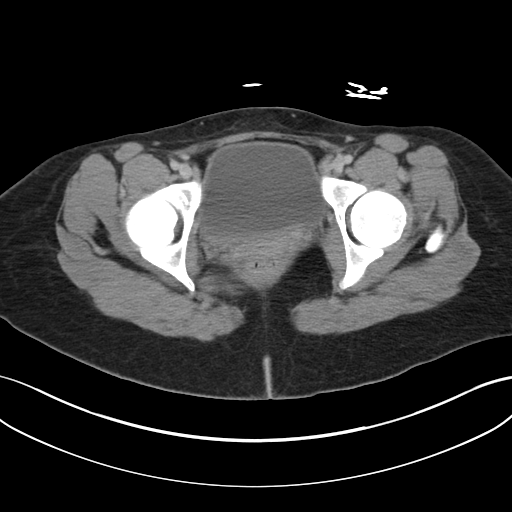
[im 19/92  lung]
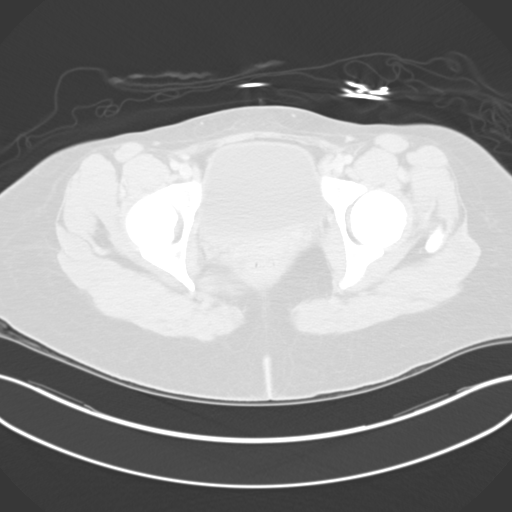
[im 19/92  bone]
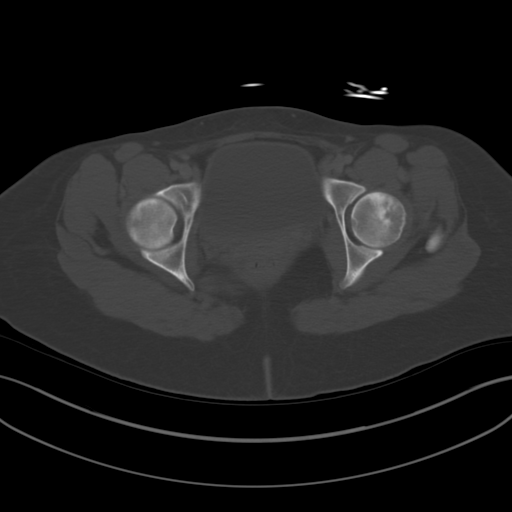
[im 37/92  soft-tissue]
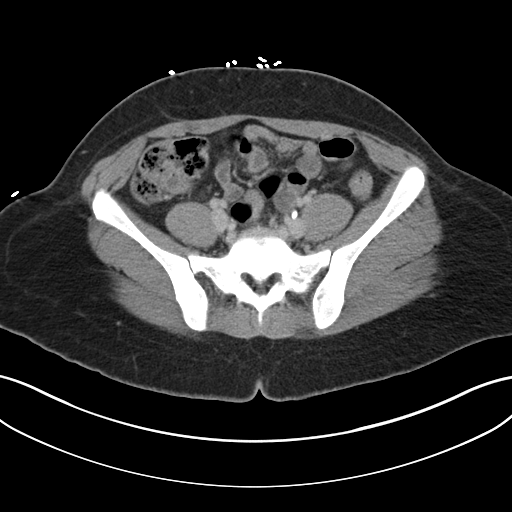
[im 37/92  lung]
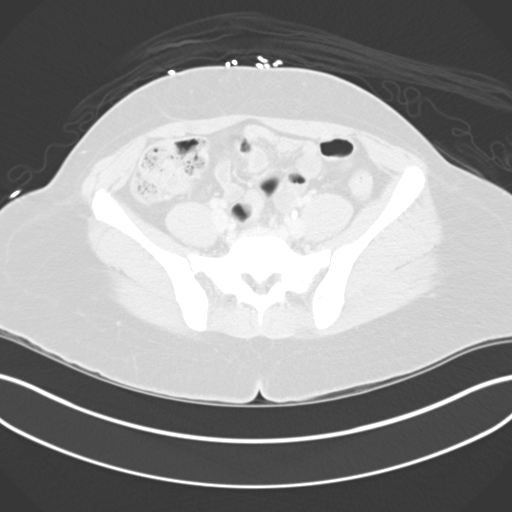
[im 55/92  soft-tissue]
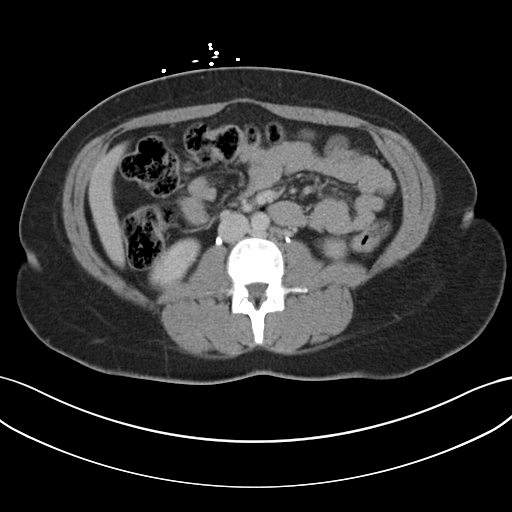
[im 55/92  lung]
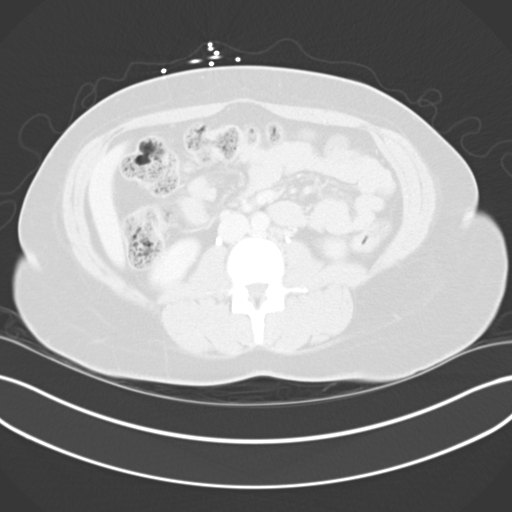
[im 73/92  soft-tissue]
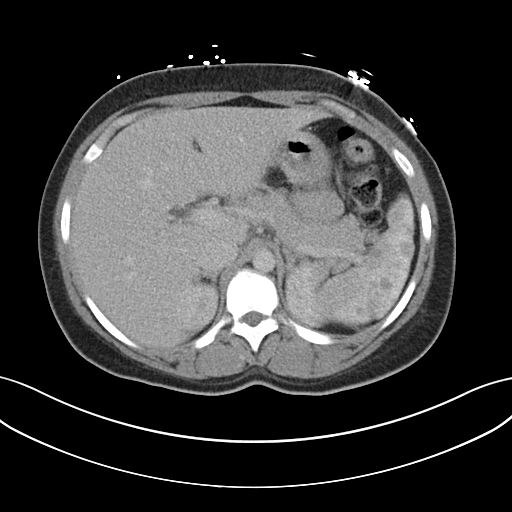
[im 73/92  lung]
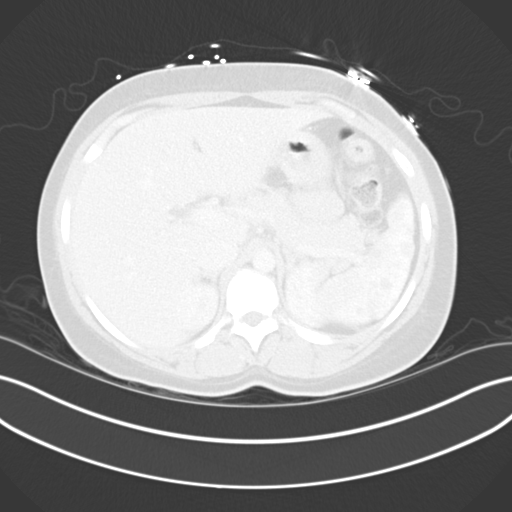

[Series 16: thins for pacs · axial · 0.70mm/px · z∈[+1055,+1148]mm · 4 of 248 slices shown]
[im 16/248  soft-tissue]
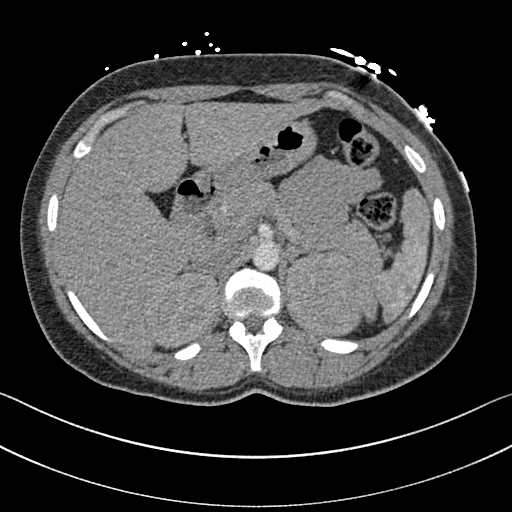
[im 47/248  soft-tissue]
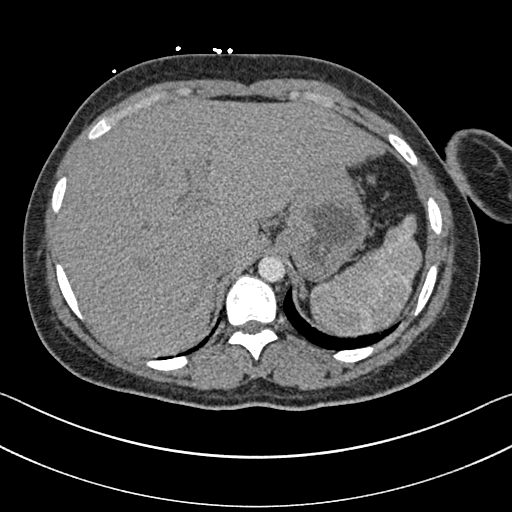
[im 78/248  soft-tissue]
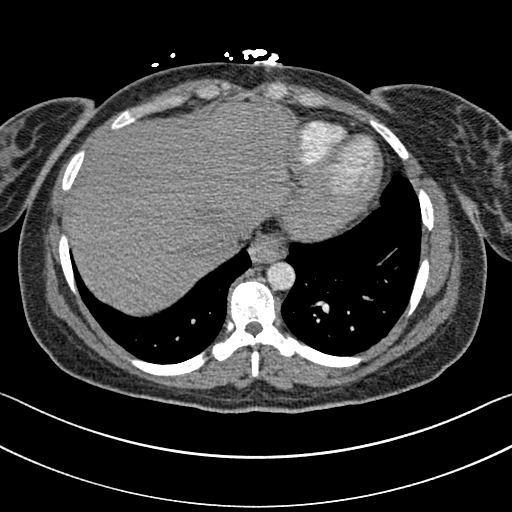
[im 109/248  soft-tissue]
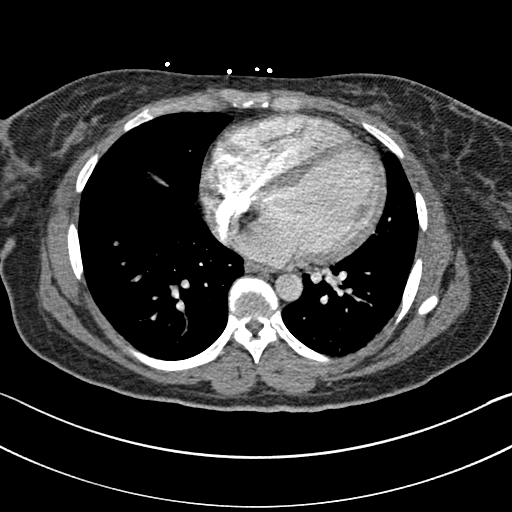

[Series 18: coronal · coronal · 0.74mm/px · 2 of 163 slices shown, 3 images]
[im 55/163  soft-tissue]
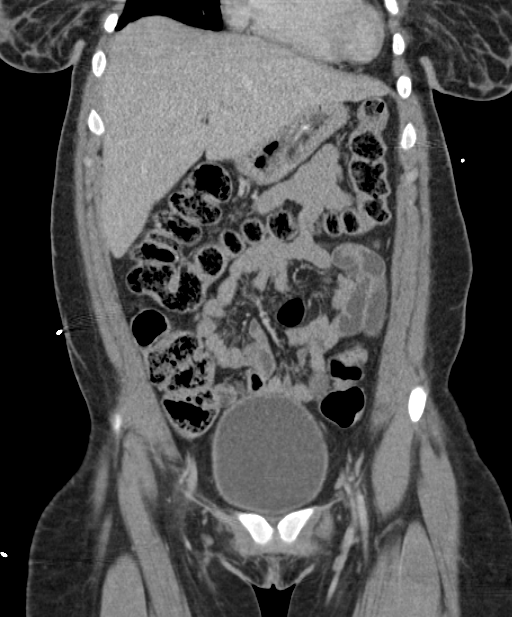
[im 55/163  bone]
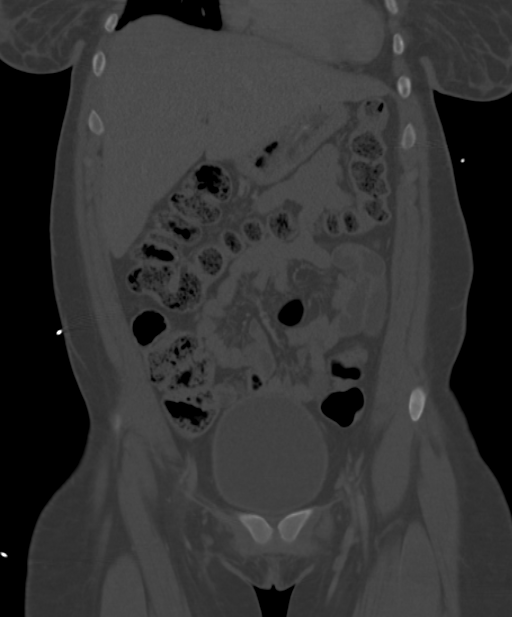
[im 109/163  soft-tissue]
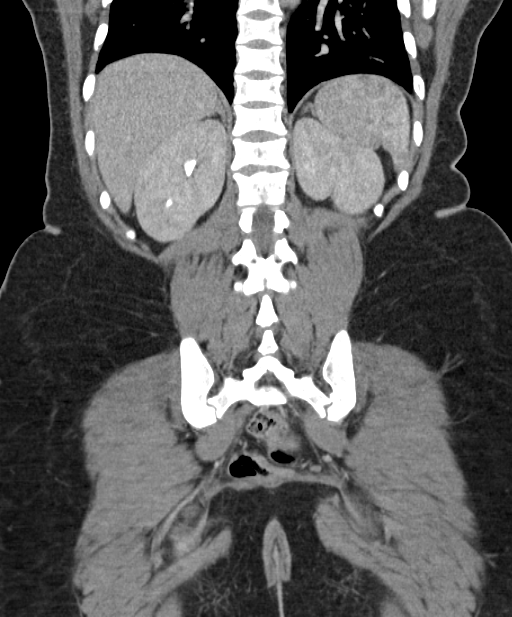

[10 of 46 positions shown; findings below may reference images not displayed]

FINDINGS: CTA CHEST FINDINGS

Cardiovascular: Study is not adequate to evaluate for pulmonary
embolism. There is motion artifact and mixing of contrast and blood
in the lobar and distal pulmonary arteries. Normal caliber of the
thoracic aorta. Incidentally, the left vertebral artery originates
off of the arch which is a normal variant. Right thyroid lobe
appears to mildly enlarged but poorly characterized. Central line
tip extends into the right atrium region.

Mediastinum/Nodes: Small amount of anterior mediastinal tissue is
suggestive for residual thymus. Small lymph nodes in the prevascular
space. Overall, there is not significant chest lymphadenopathy. No
significant axillary lymphadenopathy.

Lungs/Pleura: Trachea and mainstem bronchi are patent. Stable
pleural-based nodule in the right upper lobe on sequence 17, image
16. Subtle nodular density in the right lower lobe on image 41 is
essentially unchanged. There is a bilobed nodular structure in the
superior segment of left lower lobe that measures roughly 6 mm and
stable. This is seen on sequence 17, image 42. No large areas of
airspace disease or consolidation.

Musculoskeletal: Again noted is marked sclerosis in the left humeral
head which could be related to avascular necrosis.

Review of the MIP images confirms the above findings.

CT ABDOMEN and PELVIS FINDINGS

Hepatobiliary: Gallbladder has been removed. Normal appearance of
the liver without significant biliary dilatation. Portal venous
system is patent.

Pancreas: Normal appearance of the pancreas without inflammation or
duct dilatation.

Spleen: The spleen has diffuse nodularity and similar to the
previous examination. Similar finding was noted on a CT from
11/30/2011. No significant splenic enlargement.

Adrenals/Urinary Tract: Normal adrenal glands. Normal appearance of
both kidneys without hydronephrosis. Urinary bladder is
unremarkable. Limited evaluation for renal calculi because there is
contrast in the renal collecting systems.

Stomach/Bowel: Stomach is within normal limits. Appendix appears
normal. No evidence of bowel wall thickening, distention, or
inflammatory changes.

Vascular/Lymphatic: No significant vascular findings are present. No
enlarged abdominal or pelvic lymph nodes.

Reproductive: Uterus and bilateral adnexa are unremarkable.

Other: No free fluid. No free air. Abdominal wall laxity near the
umbilicus. There is a tiny umbilical hernia containing fat.

Musculoskeletal: Changes in the vertebral body endplates compatible
with sickle cell disease. Partially visualized lucency in the
proximal right femur is incompletely evaluated. Patchy sclerosis in
both femoral heads suggestive for AVN.

Review of the MIP images confirms the above findings.
IMPRESSION: Suboptimal evaluation for pulmonary emboli due to poor opacification
of the pulmonary arteries as described. Pulmonary emboli cannot be
excluded on this examination.

No acute chest abnormality.

Stable small pulmonary nodules that are probably postinflammatory.

No acute abnormality in the abdomen or pelvis.

Stable nodularity throughout the spleen. This nodularity has been
present since 11/30/2011 and suggestive for a benign etiology. This
is most likely associated with the sickle cell anemia.

Diffuse bone changes compatible with sickle cell anemia. Evidence
for avascular necrosis in the left humeral head and bilateral
femoral heads.

## 2017-01-10 MED ORDER — DIPHENHYDRAMINE HCL 50 MG/ML IJ SOLN
25.0000 mg | Freq: Once | INTRAMUSCULAR | Status: AC
  Administered 2017-01-11: 25 mg via INTRAVENOUS
  Filled 2017-01-10: qty 1

## 2017-01-10 MED ORDER — KETOROLAC TROMETHAMINE 30 MG/ML IJ SOLN
30.0000 mg | Freq: Once | INTRAMUSCULAR | Status: AC
Start: 2017-01-10 — End: 2017-01-11
  Administered 2017-01-11: 30 mg via INTRAVENOUS
  Filled 2017-01-10: qty 1

## 2017-01-10 MED ORDER — HYDROMORPHONE HCL 1 MG/ML IJ SOLN
2.0000 mg | INTRAMUSCULAR | Status: AC
  Administered 2017-01-11 (×3): 2 mg via INTRAVENOUS
  Filled 2017-01-10 (×3): qty 2

## 2017-01-10 MED ORDER — ONDANSETRON HCL 4 MG/2ML IJ SOLN
4.0000 mg | Freq: Once | INTRAMUSCULAR | Status: AC
  Administered 2017-01-11: 4 mg via INTRAVENOUS
  Filled 2017-01-10: qty 2

## 2017-01-10 MED ORDER — SODIUM CHLORIDE 0.45 % IV SOLN
INTRAVENOUS | Status: DC
  Administered 2017-01-11: via INTRAVENOUS

## 2017-01-10 NOTE — ED Triage Notes (Signed)
Body pain x 2 days. Sickle cell crisis. States she went to [facility] and they had a 8 hour wait. She is in no distress at triage.

## 2017-01-11 DIAGNOSIS — D5700 Hb-SS disease with crisis, unspecified: Secondary | ICD-10-CM | POA: Diagnosis not present

## 2017-01-11 LAB — CBC WITH DIFFERENTIAL/PLATELET
BASOS PCT: 1 %
Basophils Absolute: 0.1 10*3/uL (ref 0.0–0.1)
EOS ABS: 0.5 10*3/uL (ref 0.0–0.7)
Eosinophils Relative: 4 %
HCT: 24.5 % — ABNORMAL LOW (ref 36.0–46.0)
HEMOGLOBIN: 8.6 g/dL — AB (ref 12.0–15.0)
LYMPHS PCT: 27 %
Lymphs Abs: 3.2 10*3/uL (ref 0.7–4.0)
MCH: 31.2 pg (ref 26.0–34.0)
MCHC: 35.1 g/dL (ref 30.0–36.0)
MCV: 88.8 fL (ref 78.0–100.0)
Monocytes Absolute: 1.2 10*3/uL — ABNORMAL HIGH (ref 0.1–1.0)
Monocytes Relative: 10 %
NEUTROS ABS: 6.9 10*3/uL (ref 1.7–7.7)
Neutrophils Relative %: 58 %
Platelets: 495 10*3/uL — ABNORMAL HIGH (ref 150–400)
RBC: 2.76 MIL/uL — ABNORMAL LOW (ref 3.87–5.11)
RDW: 16.5 % — ABNORMAL HIGH (ref 11.5–15.5)
WBC: 11.9 10*3/uL — ABNORMAL HIGH (ref 4.0–10.5)

## 2017-01-11 LAB — COMPREHENSIVE METABOLIC PANEL
ALK PHOS: 59 U/L (ref 38–126)
ALT: 11 U/L — AB (ref 14–54)
ANION GAP: 6 (ref 5–15)
AST: 15 U/L (ref 15–41)
Albumin: 4.2 g/dL (ref 3.5–5.0)
BUN: 8 mg/dL (ref 6–20)
CALCIUM: 8.7 mg/dL — AB (ref 8.9–10.3)
CO2: 26 mmol/L (ref 22–32)
CREATININE: 0.62 mg/dL (ref 0.44–1.00)
Chloride: 106 mmol/L (ref 101–111)
GFR calc Af Amer: >60 mL/min (ref >60)
GFR calc non Af Amer: >60 mL/min (ref >60)
GLUCOSE: 96 mg/dL (ref 65–99)
Potassium: 3.4 mmol/L — ABNORMAL LOW (ref 3.5–5.1)
SODIUM: 138 mmol/L (ref 135–145)
Total Bilirubin: 1.9 mg/dL — ABNORMAL HIGH (ref 0.3–1.2)
Total Protein: 7.1 g/dL (ref 6.5–8.1)

## 2017-01-11 LAB — RETICULOCYTES
RBC.: 2.79 MIL/uL — ABNORMAL LOW (ref 3.87–5.11)
RETIC CT PCT: 14.4 % — AB (ref 0.4–3.1)
Retic Count, Absolute: 401.8 10*3/uL — ABNORMAL HIGH (ref 19.0–186.0)

## 2017-01-11 MED ORDER — DIPHENHYDRAMINE HCL 50 MG/ML IJ SOLN
25.0000 mg | Freq: Once | INTRAMUSCULAR | Status: AC
  Administered 2017-01-11: 25 mg via INTRAVENOUS
  Filled 2017-01-11: qty 1

## 2017-01-11 MED ORDER — HEPARIN SOD (PORK) LOCK FLUSH 100 UNIT/ML IV SOLN
INTRAVENOUS | Status: AC
  Filled 2017-01-11: qty 5

## 2017-01-11 NOTE — ED Provider Notes (Signed)
[name] Provider Note   CSN: [id number] Arrival date & time: 01/10/17  2145     History   Chief Complaint Chief Complaint  Patient presents with  . Sickle Cell Pain Crisis    HPI Spring San Betzler is a 26 y.o. female.  Patient presents to the emergency department for evaluation of sickle cell crisis. Patient reports that she has been having back pain for 2 days. Today she started having pain in her left elbow. This typical of pain that she has had previously. No chest pain. She has not had any fever. Patient has been taking her home meds but they have not helped.      Past Medical History:  Diagnosis Date  . Blood transfusion    "lots"  . Blood transfusion without reported diagnosis   . Chronic back pain    "very severe; have knot in my back; from tight muscle; take RX and exercise for it"  . Depression 01/06/2011  . Exertional dyspnea    "sometimes"  . Genital HSV   . GERD (gastroesophageal reflux disease) 02/17/2011  . Migraines 11/08/11   "@ least twice/month"  . 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  . Mood swings ([place]) 11/08/11   "I go back and forth; real bad"  . Sickle cell anemia ([name])   . Sickle cell anemia with crisis ([place])   . Thrombocytosis ([place]) 11/09/2015  . Trichotillomania    h/o    Patient Active Problem List   Diagnosis Date Noted  . 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 Chronic anticoagulation   . Sickle cell anemia ([place]) 05/19/2016  . History of DVT (deep vein thrombosis) 04/17/2016  . Thrombocytosis ([place]) 11/09/2015  . Hyperbilirubinemia 11/09/2015  . Chronic pain 08/04/2015  . Herpes simplex 07/14/2015  . Anemia of chronic disease   . 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 Hb-SS disease without crisis ([place]) 12/06/91    Past Surgical History:  Procedure Laterality Date  . CHOLECYSTECTOMY  05/2010  . DILATION AND CURETTAGE OF UTERUS  02/20/11   S/P  miscarriage  . IR GENERIC HISTORICAL  12/23/2015   IR FLUORO GUIDE CV LINE RIGHT 12/23/2015 Jacqulynn Cadet, MD [facility]INTER[facility]  . IR GENERIC HISTORICAL  12/23/2015   IR US GUIDE VASC ACCESS RIGHT 12/23/2015 Jacqulynn Cadet, MD [facility]    OB History    Gravida Para Term Preterm AB Living   5 2 2  0 3 2   SAB TAB Ectopic Multiple Live Births   3 0 0 0 2      Obstetric Comments   Miscarried in October 2012 at about 7 weeks       Home Medications    Prior to Admission medications   Medication Sig Start Date End Date Taking? Authorizing Provider  aspirin 81 MG chewable tablet Chew 1 tablet (81 mg total) by mouth daily. 12/15/16   [name], MD  hydroxyurea (HYDREA) 500 MG capsule Take 2 capsules (1,000 mg total) by mouth daily. May take with food to minimize GI side effects. 04/18/16   Tresa Garter, MD  lidocaine (LIDODERM) 5 % Place 1 patch onto the skin daily. Remove & Discard patch within 12 hours or as directed by MD Patient not taking: Reported on 12/09/2016 11/11/16   [name], MD  medroxyPROGESTERone (DEPO-PROVERA) 150 MG/ML injection Inject 1 mL (150 mg total) into the muscle every 3 (three) months. 09/21/16   Jodi Mourning,  Clenton Pare, MD  Menthol-Methyl Salicylate (MUSCLE RUB) 10-15 % CREA Apply 1 application topically as needed for muscle pain. 11/11/16   [name], MD  morphine (MS CONTIN) 60 MG 12 hr tablet Take 60 mg by mouth every 12 (twelve) hours as needed for pain.     [provider]  oxyCODONE (ROXICODONE) 15 MG immediate release tablet Take 15 mg by mouth every 4 (four) hours as needed for pain.     [provider]  rivaroxaban (XARELTO) 20 MG TABS tablet Take 20 mg by mouth daily with supper.     [provider]  valACYclovir (VALTREX) 1000 MG tablet Take 1 tablet (1,000 mg total) by mouth daily. 05/02/16   [name], MD    Family History Family History  Problem Relation Age of Onset  . Sickle cell  trait Mother   . Sickle cell trait Father   . Diabetes Maternal Grandmother   . Diabetes Paternal Grandmother   . Hypertension Paternal Grandmother   . Diabetes Maternal Grandfather     Social History Social History  Substance Use Topics  . Smoking status: Former Smoker    Packs/day: 0.25    Years: 1.00    Types: Cigarettes    Quit date: 03/25/2013  . Smokeless tobacco: Never Used  . Alcohol use Yes     Comment: once a month     Allergies   Food and Latex   Review of Systems Review of Systems  Musculoskeletal: Positive for arthralgias and back pain.  All other systems reviewed and are negative.    Physical Exam Updated Vital Signs BP 118/72   Pulse 73   Temp 99.4 F (37.4 C) (Oral)   Resp 20   Ht 5\' 1"  (1.549 m)   Wt 90.7 kg (200 lb)   SpO2 97%   BMI 37.79 kg/m   Physical Exam  Constitutional: She is oriented to person, place, and time. She appears well-developed and well-nourished. No distress.  HENT:  Head: Normocephalic and atraumatic.  Right Ear: Hearing normal.  Left Ear: Hearing normal.  Nose: Nose normal.  Mouth/Throat: Oropharynx is clear and moist and mucous membranes are normal.  Eyes: Pupils are equal, round, and reactive to light. Conjunctivae and EOM are normal.  Neck: Normal range of motion. Neck supple.  Cardiovascular: Regular rhythm, S1 normal and S2 normal.  Exam reveals no gallop and no friction rub.   No murmur heard. Pulmonary/Chest: Effort normal and breath sounds normal. No respiratory distress. She exhibits no tenderness.  Abdominal: Soft. Normal appearance and bowel sounds are normal. There is no hepatosplenomegaly. There is no tenderness. There is no rebound, no guarding, no tenderness at McBurney's point and negative Murphy's sign. No hernia.  Musculoskeletal: Normal range of motion.  Neurological: She is alert and oriented to person, place, and time. She has normal strength. No cranial nerve deficit or sensory deficit.  Coordination normal. GCS eye subscore is 4. GCS verbal subscore is 5. GCS motor subscore is 6.  Skin: Skin is warm, dry and intact. No rash noted. No cyanosis.  Psychiatric: She has a normal mood and affect. Her speech is normal and behavior is normal. Thought content normal.  Nursing note and vitals reviewed.    ED Treatments / Results  Labs (all labs ordered are listed, but only abnormal results are displayed) Labs Reviewed  COMPREHENSIVE METABOLIC PANEL - Abnormal; Notable for the following:       Result Value   Potassium 3.4 (*)  Calcium 8.7 (*)    ALT 11 (*)    Total Bilirubin 1.9 (*)    All other components within normal limits  CBC WITH DIFFERENTIAL/PLATELET - Abnormal; Notable for the following:    WBC 11.9 (*)    RBC 2.76 (*)    Hemoglobin 8.6 (*)    HCT 24.5 (*)    RDW 16.5 (*)    Platelets 495 (*)    Monocytes Absolute 1.2 (*)    All other components within normal limits  RETICULOCYTES    EKG  EKG Interpretation None       Radiology No results found.  Procedures Procedures (including critical care time)  Medications Ordered in ED Medications  HYDROmorphone (DILAUDID) injection 2 mg (2 mg Intravenous Given 01/11/17 0019)  0.45 % sodium chloride infusion ( Intravenous New Bag/Given 01/11/17 0010)  ketorolac (TORADOL) 30 MG/ML injection 30 mg (30 mg Intravenous Given 01/11/17 0018)  ondansetron (ZOFRAN) injection 4 mg (4 mg Intravenous Given 01/11/17 0016)  diphenhydrAMINE (BENADRYL) injection 25 mg (25 mg Intravenous Given 01/11/17 0015)     Initial Impression / Assessment and Plan / ED Course  I have reviewed the triage vital signs and the nursing notes.  Pertinent labs & imaging results that were available during my care of the patient were reviewed by me and considered in my medical decision making (see chart for details).     Patient presents to the ER for evaluation of sickle cell crisis. Patient is experiencing typical crisis for her. She is  experiencing back and joint pain but no chest pain. There is no concern for acute chest. Lab work is appropriate. Patient administered IV fluids and analgesia with improvement. She will follow-up with primary care.  Final Clinical Impressions(s) / ED Diagnoses   Final diagnoses:  Sickle cell crisis [facility])    New Prescriptions New Prescriptions   No medications on file     Orpah Greek, MD 01/11/17 [phone]

## 2017-01-11 NOTE — ED Notes (Addendum)
Per previous nurse, pt complained of itching still because she "normally gets 50mg  of benadryl."  Upon entering room, pt is not itching, she is asleep and does not appear to have any scratch marks or visible signs of itching.  Pt falling asleep as medicine is being administered, O2 sats WNL. Pt denies any needs at this time.

## 2017-01-11 NOTE — ED Notes (Signed)
Port heplocked, pt verbalizes understanding of dc instructions and denies any further needs at this time.  Wheeled to lobby to await sister who "lives right around the corner."

## 2017-01-11 NOTE — ED Notes (Signed)
Per patient, her pain is still an 8/10, the medication has not helped despite appearing quite comfortable in the room.  She states, "I'll still go home anyway though."

## 2017-01-17 DIAGNOSIS — Z79891 Long term (current) use of opiate analgesic: Secondary | ICD-10-CM | POA: Diagnosis not present

## 2017-01-26 ENCOUNTER — Emergency Department (HOSPITAL_COMMUNITY)
Admission: EM | Admit: 2017-01-26 | Discharge: 2017-01-26 | Disposition: A | Discharge status: Home / Self Care | Payer: Medicare Other | Attending: Emergency Medicine | Admitting: Emergency Medicine

## 2017-01-26 ENCOUNTER — Emergency Department (HOSPITAL_COMMUNITY)
Admission: EM | Admit: 2017-01-26 | Discharge: 2017-01-26 | Disposition: A | Discharge status: Home / Self Care | Payer: Medicare Other | Source: Home / Self Care

## 2017-01-26 ENCOUNTER — Encounter (HOSPITAL_COMMUNITY): Payer: Self-pay | Admitting: Emergency Medicine

## 2017-01-26 ENCOUNTER — Encounter (HOSPITAL_COMMUNITY): Payer: Self-pay | Admitting: *Deleted

## 2017-01-26 DIAGNOSIS — Z91040 Latex allergy status: Secondary | ICD-10-CM | POA: Insufficient documentation

## 2017-01-26 DIAGNOSIS — Z87891 Personal history of nicotine dependence: Secondary | ICD-10-CM | POA: Insufficient documentation

## 2017-01-26 DIAGNOSIS — Z5321 Procedure and treatment not carried out due to patient leaving prior to being seen by health care provider: Secondary | ICD-10-CM

## 2017-01-26 DIAGNOSIS — M549 Dorsalgia, unspecified: Secondary | ICD-10-CM

## 2017-01-26 DIAGNOSIS — M545 Low back pain: Secondary | ICD-10-CM | POA: Diagnosis present

## 2017-01-26 DIAGNOSIS — D5700 Hb-SS disease with crisis, unspecified: Secondary | ICD-10-CM

## 2017-01-26 LAB — CBC WITH DIFFERENTIAL/PLATELET
BASOS ABS: 0.1 10*3/uL (ref 0.0–0.1)
BASOS PCT: 0 %
Eosinophils Absolute: 0.4 10*3/uL (ref 0.0–0.7)
Eosinophils Relative: 3 %
HEMATOCRIT: 22.3 % — AB (ref 36.0–46.0)
HEMOGLOBIN: 7.5 g/dL — AB (ref 12.0–15.0)
Lymphocytes Relative: 16 %
Lymphs Abs: 2.2 10*3/uL (ref 0.7–4.0)
MCH: 30 pg (ref 26.0–34.0)
MCHC: 33.6 g/dL (ref 30.0–36.0)
MCV: 89.2 fL (ref 78.0–100.0)
MONOS PCT: 10 %
Monocytes Absolute: 1.4 10*3/uL — ABNORMAL HIGH (ref 0.1–1.0)
NEUTROS ABS: 9.5 10*3/uL — AB (ref 1.7–7.7)
NEUTROS PCT: 71 %
Platelets: 553 10*3/uL — ABNORMAL HIGH (ref 150–400)
RBC: 2.5 MIL/uL — AB (ref 3.87–5.11)
RDW: 17.8 % — ABNORMAL HIGH (ref 11.5–15.5)
WBC: 13.5 10*3/uL — AB (ref 4.0–10.5)

## 2017-01-26 LAB — URINALYSIS, ROUTINE W REFLEX MICROSCOPIC
BILIRUBIN URINE: NEGATIVE
Bacteria, UA: NONE SEEN
Glucose, UA: NEGATIVE mg/dL
Ketones, ur: NEGATIVE mg/dL
LEUKOCYTES UA: NEGATIVE
NITRITE: NEGATIVE
PH: 7 (ref 5.0–8.0)
Protein, ur: NEGATIVE mg/dL
Specific Gravity, Urine: 1.01 (ref 1.005–1.030)

## 2017-01-26 LAB — RETICULOCYTES
RBC.: 2.56 MIL/uL — AB (ref 3.87–5.11)
Retic Count, Absolute: 412.2 10*3/uL — ABNORMAL HIGH (ref 19.0–186.0)
Retic Ct Pct: 16.1 % — ABNORMAL HIGH (ref 0.4–3.1)

## 2017-01-26 LAB — BASIC METABOLIC PANEL
ANION GAP: 6 (ref 5–15)
BUN: 9 mg/dL (ref 6–20)
CHLORIDE: 107 mmol/L (ref 101–111)
CO2: 24 mmol/L (ref 22–32)
Calcium: 8.6 mg/dL — ABNORMAL LOW (ref 8.9–10.3)
Creatinine, Ser: 0.92 mg/dL (ref 0.44–1.00)
GFR calc non Af Amer: >60 mL/min (ref >60)
Glucose, Bld: 105 mg/dL — ABNORMAL HIGH (ref 65–99)
POTASSIUM: 3.6 mmol/L (ref 3.5–5.1)
Sodium: 137 mmol/L (ref 135–145)

## 2017-01-26 MED ORDER — HYDROMORPHONE HCL 1 MG/ML IJ SOLN
2.0000 mg | INTRAMUSCULAR | Status: AC
  Filled 2017-01-26: qty 2

## 2017-01-26 MED ORDER — HYDROMORPHONE HCL 1 MG/ML IJ SOLN: 2.0000 mg | INTRAMUSCULAR | Status: AC

## 2017-01-26 MED ORDER — DIPHENHYDRAMINE HCL 50 MG/ML IJ SOLN
25.0000 mg | Freq: Once | INTRAMUSCULAR | Status: AC
  Administered 2017-01-26: 25 mg via INTRAVENOUS
  Filled 2017-01-26: qty 1

## 2017-01-26 MED ORDER — HYDROMORPHONE HCL 1 MG/ML IJ SOLN: 0.5000 mg | Freq: Once | INTRAMUSCULAR | Status: DC

## 2017-01-26 MED ORDER — METHOCARBAMOL 1000 MG/10ML IJ SOLN: 500.0000 mg | Freq: Once | INTRAMUSCULAR | Status: DC

## 2017-01-26 MED ORDER — METHOCARBAMOL 1000 MG/10ML IJ SOLN
500.0000 mg | Freq: Once | INTRAVENOUS | Status: AC
  Administered 2017-01-26: 500 mg via INTRAVENOUS
  Filled 2017-01-26: qty 5

## 2017-01-26 MED ORDER — KETOROLAC TROMETHAMINE 30 MG/ML IJ SOLN
30.0000 mg | INTRAMUSCULAR | Status: AC
  Administered 2017-01-26: 30 mg via INTRAVENOUS
  Filled 2017-01-26: qty 1

## 2017-01-26 MED ORDER — ONDANSETRON HCL 4 MG/2ML IJ SOLN: 4.0000 mg | INTRAMUSCULAR | Status: DC | PRN

## 2017-01-26 MED ORDER — DEXTROSE-NACL 5-0.45 % IV SOLN
INTRAVENOUS | Status: DC
  Administered 2017-01-26: 09:00:00 via INTRAVENOUS

## 2017-01-26 MED ORDER — HYDROMORPHONE HCL 1 MG/ML IJ SOLN
2.0000 mg | INTRAMUSCULAR | Status: AC
Start: 2017-01-26 — End: 2017-01-26
  Administered 2017-01-26 (×2): 2 mg via INTRAVENOUS
  Filled 2017-01-26: qty 2

## 2017-01-26 MED ORDER — HEPARIN SOD (PORK) LOCK FLUSH 100 UNIT/ML IV SOLN
500.0000 [IU] | Freq: Once | INTRAVENOUS | Status: AC
  Administered 2017-01-26: 500 [IU]
  Filled 2017-01-26: qty 5

## 2017-01-26 MED ORDER — HYDROMORPHONE HCL 1 MG/ML IJ SOLN
2.0000 mg | INTRAMUSCULAR | Status: AC
  Administered 2017-01-26: 2 mg via INTRAVENOUS

## 2017-01-26 MED ORDER — DIPHENHYDRAMINE HCL 25 MG PO CAPS: 25.0000 mg | ORAL_CAPSULE | ORAL | Status: DC | PRN

## 2017-01-26 NOTE — ED Triage Notes (Signed)
Pt having a sickle cell crisis for the past 4 days; reports back pain. Pt has had an increase in pain medications within the past month. Pt was seen at [facility] and left before being seen due to the wait time

## 2017-01-26 NOTE — ED Notes (Signed)
Pt resting, awakes to stimulation

## 2017-01-26 NOTE — ED Triage Notes (Signed)
Pt is c/o back pain that started 3 days ago  Pt states it is consistent  with her normal sickle cell pain

## 2017-01-26 NOTE — ED Provider Notes (Signed)
Emergency Department Provider Note   I have reviewed the triage vital signs and the nursing notes.   HISTORY  Chief Complaint Sickle Cell Pain Crisis   HPI [name] is a 26 y.o. female With a history of sickle cell anemia, requiring blood transfusions who presents emergency department today secondary to what seems to be a typical sickle cell crisis. Patient states that she has been able to control these better at home more recently with medication changes however this episode in the last 3-4 days has progressively worsened with bilateral lower back pain similar to previous episodes. No urinary symptoms, respiratory symptoms, gastrointestinal symptoms or pain elsewhere.   Past Medical History:  Diagnosis Date  . Blood transfusion    "lots"  . Blood transfusion without reported diagnosis   . Chronic back pain    "very severe; have knot in my back; from tight muscle; take RX and exercise for it"  . Depression 01/06/2011  . Exertional dyspnea    "sometimes"  . Genital HSV   . GERD (gastroesophageal reflux disease) 02/17/2011  . Migraines 11/08/11   "@ least twice/month"  . 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  . Mood swings ([place]) 11/08/11   "I go back and forth; real bad"  . Sickle cell anemia ([name])   . Sickle cell anemia with crisis ([place])   . Thrombocytosis ([place]) 11/09/2015  . Trichotillomania    h/o    Patient Active Problem List   Diagnosis Date Noted  . 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 Chronic anticoagulation   . Sickle cell anemia ([place]) 05/19/2016  . History of DVT (deep vein thrombosis) 04/17/2016  . Thrombocytosis ([place]) 11/09/2015  . Hyperbilirubinemia 11/09/2015  . Chronic pain 08/04/2015  . Herpes simplex 07/14/2015  . Anemia of chronic disease   . 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 CHOLECYSTECTOMY  05/2010  . DILATION AND CURETTAGE OF UTERUS  02/20/11   S/P miscarriage  . IR GENERIC HISTORICAL  12/23/2015   IR FLUORO GUIDE CV LINE RIGHT 12/23/2015 Jacqulynn Cadet, MD [facility]  . IR GENERIC HISTORICAL  12/23/2015   IR US GUIDE VASC ACCESS RIGHT 12/23/2015 Jacqulynn Cadet, MD [facility]    Current Outpatient Rx  . Order #: [id number] Class: Normal  . Order #: [id number] Class: Normal  . Order #: [id number] Class: Normal  . Order #: [id number] Class: Normal  . Order #: [id number] Class: OTC  . Order #: [id number] Class: Historical Med  . Order #: [id number] Class: Historical Med  . Order #: [id number] Class: Historical Med  . Order #: [id number] Class: Normal    Allergies Food and Latex  Family History  Problem Relation Age of Onset  . Sickle cell trait Mother   . Sickle cell trait Father   . Diabetes Maternal Grandmother   . Diabetes Paternal Grandmother   . Hypertension Paternal Grandmother   . Diabetes Maternal Grandfather     Social History Social History  Substance Use Topics  . Smoking status: Former Smoker    Packs/day: 0.25    Years: 1.00    Types: Cigarettes    Quit date: 03/25/2013  . Smokeless tobacco: Never Used  . Alcohol use No     Comment: denies    Review of Systems  All other systems negative except as documented in the HPI. All pertinent positives and negatives as reviewed  in the HPI. ____________________________________________   PHYSICAL EXAM:  VITAL SIGNS: ED Triage Vitals [01/26/17 0621]  Enc Vitals Group     BP 121/85     Pulse Rate 82     Resp 18     Temp 99 F (37.2 C)     Temp src      SpO2 100 %     Weight      Height      Head Circumference      Peak Flow      Pain Score 10     Pain Loc      Pain Edu?      Excl. in [place]?     Constitutional: Alert and oriented. Well appearing and in no acute distress. Eyes: Conjunctivae are normal. PERRL. EOMI. Head: Atraumatic. Nose: No  congestion/rhinnorhea. Mouth/Throat: Mucous membranes are moist.  Oropharynx non-erythematous. Neck: No stridor.  No meningeal signs.   Cardiovascular: Normal rate, regular rhythm. Good peripheral circulation. Grossly normal heart sounds.   Respiratory: Normal respiratory effort.  No retractions. Lungs CTAB. Gastrointestinal: Soft and nontender. No distention.  Musculoskeletal: No lower extremity tenderness nor edema. No gross deformities of extremities.Some muscular tenderness of bilateral paraspinal muscles Neurologic:  Normal speech and language. No gross focal neurologic deficits are appreciated.  Skin:  Skin is warm, dry and intact. No rash noted.   ____________________________________________   LABS (all labs ordered are listed, but only abnormal results are displayed)  Labs Reviewed  URINALYSIS, ROUTINE W REFLEX MICROSCOPIC - Abnormal; Notable for the following:       Result Value   Hgb urine dipstick SMALL (*)    Squamous Epithelial / LPF 0-5 (*)    All other components within normal limits  RETICULOCYTES - Abnormal; Notable for the following:    Retic Ct Pct 16.1 (*)    RBC. 2.56 (*)    Retic Count, Absolute 412.2 (*)    All other components within normal limits  CBC WITH DIFFERENTIAL/PLATELET - Abnormal; Notable for the following:    WBC 13.5 (*)    RBC 2.50 (*)    Hemoglobin 7.5 (*)    HCT 22.3 (*)    RDW 17.8 (*)    Platelets 553 (*)    Neutro Abs 9.5 (*)    Monocytes Absolute 1.4 (*)    All other components within normal limits  BASIC METABOLIC PANEL - Abnormal; Notable for the following:    Glucose, Bld 105 (*)    Calcium 8.6 (*)    All other components within normal limits   ____________________________________________  PROCEDURES  Procedure(s) performed:   Procedures  ____________________________________________   INITIAL IMPRESSION / ASSESSMENT AND PLAN / ED COURSE  Pertinent labs & imaging results that were available during my care of the  patient were reviewed by me and considered in my medical decision making (see chart for details). Suspect sickle cell crisis however with CVA tenderness could be muscular as well. Also consider infection so we'll get a UA. We'll treat her pain appropriately in the meantime.  Reevaluation patient sleepy and says pain is significant improvement.  Multiple reevaluations and patient is awake, talking on phone, eating and significant improvement in symptoms. Felt to be stable for discharge at this time.    ____________________________________________  FINAL CLINICAL IMPRESSION(S) / ED DIAGNOSES  Final diagnoses:  Sickle cell pain crisis ([place])     MEDICATIONS GIVEN DURING THIS VISIT:  Medications  dextrose 5 %-0.45 % sodium chloride infusion ( Intravenous New Bag/Given  01/26/17 0847)  ondansetron (ZOFRAN) injection 4 mg (not administered)  HYDROmorphone (DILAUDID) injection 2 mg (2 mg Intravenous Not Given 01/26/17 0745)    Or  HYDROmorphone (DILAUDID) injection 2 mg ( Subcutaneous See Alternative 01/26/17 0745)  HYDROmorphone (DILAUDID) injection 2 mg (2 mg Intravenous Not Given 01/26/17 0815)    Or  HYDROmorphone (DILAUDID) injection 2 mg ( Subcutaneous See Alternative 01/26/17 0815)  ketorolac (TORADOL) 30 MG/ML injection 30 mg (30 mg Intravenous Given 01/26/17 0839)  HYDROmorphone (DILAUDID) injection 2 mg (2 mg Intravenous Given 01/26/17 0840)    Or  HYDROmorphone (DILAUDID) injection 2 mg ( Subcutaneous See Alternative 01/26/17 0840)  HYDROmorphone (DILAUDID) injection 2 mg (2 mg Intravenous Given 01/26/17 1051)    Or  HYDROmorphone (DILAUDID) injection 2 mg ( Subcutaneous See Alternative 01/26/17 1051)  diphenhydrAMINE (BENADRYL) injection 25 mg (25 mg Intravenous Given 01/26/17 0839)  methocarbamol (ROBAXIN) 500 mg in dextrose 5 % 50 mL IVPB (0 mg Intravenous Stopped 01/26/17 0949)  diphenhydrAMINE (BENADRYL) injection 25 mg (25 mg Intravenous Given 01/26/17 1057)     NEW OUTPATIENT  MEDICATIONS STARTED DURING THIS VISIT:  New Prescriptions   No medications on file    Note:  This document was prepared using Dragon voice recognition software and may include unintentional dictation errors.    Merrily Pew, MD 01/26/17 [phone]

## 2017-01-26 NOTE — ED Notes (Signed)
ED Provider at bedside. 

## 2017-01-26 NOTE — ED Triage Notes (Signed)
Pt states she is leaving and cannot wait any longer

## 2017-01-31 ENCOUNTER — Emergency Department (HOSPITAL_COMMUNITY)
Admission: EM | Admit: 2017-01-31 | Discharge: 2017-01-31 | Disposition: A | Discharge status: Home / Self Care | Payer: Medicare Other | Source: Home / Self Care | Attending: Emergency Medicine | Admitting: Emergency Medicine

## 2017-01-31 ENCOUNTER — Encounter (HOSPITAL_COMMUNITY): Payer: Self-pay | Admitting: Emergency Medicine

## 2017-01-31 DIAGNOSIS — D5700 Hb-SS disease with crisis, unspecified: Secondary | ICD-10-CM

## 2017-01-31 DIAGNOSIS — B009 Herpesviral infection, unspecified: Secondary | ICD-10-CM | POA: Diagnosis not present

## 2017-01-31 DIAGNOSIS — F329 Major depressive disorder, single episode, unspecified: Secondary | ICD-10-CM | POA: Diagnosis not present

## 2017-01-31 DIAGNOSIS — Z832 Family history of diseases of the blood and blood-forming organs and certain disorders involving the immune mechanism: Secondary | ICD-10-CM

## 2017-01-31 DIAGNOSIS — R079 Chest pain, unspecified: Secondary | ICD-10-CM | POA: Diagnosis not present

## 2017-01-31 DIAGNOSIS — F633 Trichotillomania: Secondary | ICD-10-CM | POA: Diagnosis present

## 2017-01-31 DIAGNOSIS — R7989 Other specified abnormal findings of blood chemistry: Secondary | ICD-10-CM | POA: Diagnosis not present

## 2017-01-31 DIAGNOSIS — Z79899 Other long term (current) drug therapy: Secondary | ICD-10-CM | POA: Insufficient documentation

## 2017-01-31 DIAGNOSIS — Z7982 Long term (current) use of aspirin: Secondary | ICD-10-CM | POA: Insufficient documentation

## 2017-01-31 DIAGNOSIS — Z91040 Latex allergy status: Secondary | ICD-10-CM

## 2017-01-31 DIAGNOSIS — Z87891 Personal history of nicotine dependence: Secondary | ICD-10-CM

## 2017-01-31 DIAGNOSIS — Z91018 Allergy to other foods: Secondary | ICD-10-CM

## 2017-01-31 DIAGNOSIS — Z86718 Personal history of other venous thrombosis and embolism: Secondary | ICD-10-CM | POA: Diagnosis not present

## 2017-01-31 DIAGNOSIS — Z7901 Long term (current) use of anticoagulants: Secondary | ICD-10-CM | POA: Diagnosis not present

## 2017-01-31 LAB — RETICULOCYTES
RBC.: 2.6 MIL/uL — ABNORMAL LOW (ref 3.87–5.11)
RETIC COUNT ABSOLUTE: 512.2 10*3/uL — AB (ref 19.0–186.0)
Retic Ct Pct: 19.7 % — ABNORMAL HIGH (ref 0.4–3.1)

## 2017-01-31 LAB — CBC WITH DIFFERENTIAL/PLATELET
Basophils Absolute: 0.1 10*3/uL (ref 0.0–0.1)
Basophils Relative: 1 %
Eosinophils Absolute: 0.4 10*3/uL (ref 0.0–0.7)
Eosinophils Relative: 4 %
HEMATOCRIT: 23.4 % — AB (ref 36.0–46.0)
HEMOGLOBIN: 8.3 g/dL — AB (ref 12.0–15.0)
Lymphocytes Relative: 29 %
Lymphs Abs: 3.1 10*3/uL (ref 0.7–4.0)
MCH: 31.9 pg (ref 26.0–34.0)
MCHC: 35.5 g/dL (ref 30.0–36.0)
MCV: 90 fL (ref 78.0–100.0)
MONOS PCT: 12 %
Monocytes Absolute: 1.2 10*3/uL — ABNORMAL HIGH (ref 0.1–1.0)
NEUTROS ABS: 5.9 10*3/uL (ref 1.7–7.7)
NEUTROS PCT: 54 %
Platelets: 578 10*3/uL — ABNORMAL HIGH (ref 150–400)
RBC: 2.6 MIL/uL — AB (ref 3.87–5.11)
RDW: 19.4 % — ABNORMAL HIGH (ref 11.5–15.5)
WBC: 10.7 10*3/uL — AB (ref 4.0–10.5)

## 2017-01-31 LAB — BASIC METABOLIC PANEL
ANION GAP: 7 (ref 5–15)
BUN: 12 mg/dL (ref 6–20)
CALCIUM: 9 mg/dL (ref 8.9–10.3)
CO2: 25 mmol/L (ref 22–32)
CREATININE: 0.72 mg/dL (ref 0.44–1.00)
Chloride: 106 mmol/L (ref 101–111)
GFR calc Af Amer: >60 mL/min (ref >60)
GLUCOSE: 96 mg/dL (ref 65–99)
Potassium: 3.9 mmol/L (ref 3.5–5.1)
Sodium: 138 mmol/L (ref 135–145)

## 2017-01-31 MED ORDER — HEPARIN SOD (PORK) LOCK FLUSH 100 UNIT/ML IV SOLN
500.0000 [IU] | Freq: Once | INTRAVENOUS | Status: AC
  Administered 2017-01-31: 500 [IU]
  Filled 2017-01-31: qty 5

## 2017-01-31 MED ORDER — DIPHENHYDRAMINE HCL 50 MG/ML IJ SOLN
25.0000 mg | Freq: Once | INTRAMUSCULAR | Status: AC
  Administered 2017-01-31: 25 mg via INTRAVENOUS
  Filled 2017-01-31: qty 1

## 2017-01-31 MED ORDER — ONDANSETRON HCL 4 MG/2ML IJ SOLN
4.0000 mg | Freq: Once | INTRAMUSCULAR | Status: AC
  Administered 2017-01-31: 4 mg via INTRAVENOUS
  Filled 2017-01-31: qty 2

## 2017-01-31 MED ORDER — HYDROMORPHONE HCL 1 MG/ML IJ SOLN
2.0000 mg | Freq: Once | INTRAMUSCULAR | Status: AC
  Administered 2017-01-31: 2 mg via INTRAVENOUS
  Filled 2017-01-31: qty 2

## 2017-01-31 MED ORDER — SODIUM CHLORIDE 0.9 % IV BOLUS (SEPSIS)
1000.0000 mL | Freq: Once | INTRAVENOUS | Status: AC
  Administered 2017-01-31: 1000 mL via INTRAVENOUS

## 2017-01-31 NOTE — ED Notes (Signed)
Unable to collect labs patient wants her port access 

## 2017-01-31 NOTE — ED Triage Notes (Signed)
Pt states she is having a sickle cell crisis with pain in her lower back  Pt states she has been trying to treat it at home with home remedies but it is not helping  Pt states it hurts to take a deep breath on both of her sides

## 2017-01-31 NOTE — ED Provider Notes (Signed)
[facility] DEPT Provider Note: Georgena Spurling, MD, FACEP  CSN: [id number] MRN: [id number] ARRIVAL: 01/31/17 at [place]: [place]  Sickle Cell Pain Crisis   HISTORY OF PRESENT ILLNESS  01/31/17 1:40 AM [name] is a 26 y.o. female with sickle cell disease and chronic back pain. She is here with exacerbation of her back pain for about the past week. She has been trying to manage this at home with her home medications but the pain became too severe. She rates her pain as a 10 out of 10 consistent with previous sickle cell pain. It is worse with movement of her lower back or with deep breathing. She denies chest pain, shortness of breath or fever. She has had occasional nausea.   Past Medical History:  Diagnosis Date  . Blood transfusion    "lots"  . Blood transfusion without reported diagnosis   . Chronic back pain    "very severe; have knot in my back; from tight muscle; take RX and exercise for it"  . Depression 01/06/2011  . Exertional dyspnea    "sometimes"  . Genital HSV   . GERD (gastroesophageal reflux disease) 02/17/2011  . Migraines 11/08/11   "@ least twice/month"  . 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  . Mood swings ([place]) 11/08/11   "I go back and forth; real bad"  . Sickle cell anemia ([name])   . Sickle cell anemia with crisis ([place])   . Thrombocytosis ([place]) 11/09/2015  . Trichotillomania    h/o    Past Surgical History:  Procedure Laterality Date  . CHOLECYSTECTOMY  05/2010  . DILATION AND CURETTAGE OF UTERUS  02/20/11   S/P miscarriage  . IR GENERIC HISTORICAL  12/23/2015   IR FLUORO GUIDE CV LINE RIGHT 12/23/2015 Jacqulynn Cadet, MD [facility]  . IR GENERIC HISTORICAL  12/23/2015   IR US GUIDE VASC ACCESS RIGHT 12/23/2015 Jacqulynn Cadet, MD [facility]    Family History  Problem Relation Age of Onset  . Sickle cell trait Mother   . Sickle cell trait Father   . Diabetes Maternal Grandmother   . Diabetes  Paternal Grandmother   . Hypertension Paternal Grandmother   . Diabetes Maternal Grandfather     Social History  Substance Use Topics  . Smoking status: Former Smoker    Packs/day: 0.25    Years: 1.00    Types: Cigarettes    Quit date: 03/25/2013  . Smokeless tobacco: Never Used  . Alcohol use No     Comment: denies    Prior to Admission medications   Medication Sig Start Date End Date Taking? Authorizing Provider  aspirin 81 MG chewable tablet Chew 1 tablet (81 mg total) by mouth daily. 12/15/16  Yes [name], MD  hydroxyurea (HYDREA) 500 MG capsule Take 2 capsules (1,000 mg total) by mouth daily. May take with food to minimize GI side effects. 04/18/16  Yes Tresa Garter, MD  medroxyPROGESTERone (DEPO-PROVERA) 150 MG/ML injection Inject 1 mL (150 mg total) into the muscle every 3 (three) months. 09/21/16  Yes Shelly Bombard, MD  morphine (MS CONTIN) 60 MG 12 hr tablet Take 60 mg by mouth every 12 (twelve) hours as needed for pain.    Yes [provider]  Oxycodone HCl 20 MG TABS Take 1 tablet by mouth every 4 (four) hours as needed (pain).  01/17/17  Yes [provider]  rivaroxaban (XARELTO) 20 MG TABS tablet Take 20 mg  by mouth daily with supper.    Yes [provider]  valACYclovir (VALTREX) 1000 MG tablet Take 1 tablet (1,000 mg total) by mouth daily. 05/02/16  Yes [name], MD  lidocaine (LIDODERM) 5 % Place 1 patch onto the skin daily. Remove & Discard patch within 12 hours or as directed by MD Patient not taking: Reported on 12/09/2016 11/11/16   [name], MD  Menthol-Methyl Salicylate (MUSCLE RUB) 10-15 % CREA Apply 1 application topically as needed for muscle pain. Patient not taking: Reported on 01/31/2017 11/11/16   [name], MD    Allergies Food and Latex   REVIEW OF SYSTEMS  Negative except as noted here or in the History of Present Illness.   PHYSICAL EXAMINATION  Initial Vital  Signs Blood pressure 112/85, pulse 95, temperature 98.5 F (36.9 C), temperature source Oral, resp. rate 18, last menstrual period 01/22/2017, SpO2 96 %, not currently breastfeeding.  Examination General: Well-developed, well-nourished female in no acute distress; appearance consistent with age of record HENT: normocephalic; atraumatic Eyes: pupils equal, round and reactive to light; extraocular muscles intact Neck: supple Heart: regular rate and rhythm Lungs: clear to auscultation bilaterally Abdomen: soft; nondistended; nontender; bowel sounds present Back: Pain on movement of lower back Extremities: No deformity; full range of motion; pulses normal Neurologic: Awake, alert and oriented; motor function intact in all extremities and symmetric; no facial droop Skin: Warm and dry Psychiatric: Normal mood and affect   RESULTS  Summary of this visit's results, reviewed by myself:   EKG Interpretation  Date/Time:    Ventricular Rate:    PR Interval:    QRS Duration:   QT Interval:    QTC Calculation:   R Axis:     Text Interpretation:        Laboratory Studies: Results for orders placed or performed during the hospital encounter of 01/31/17 (from the past 24 hour(s))  CBC with Differential/Platelet     Status: Abnormal   Collection Time: 01/31/17  1:40 AM  Result Value Ref Range   WBC 10.7 (H) 4.0 - 10.5 K/uL   RBC 2.60 (L) 3.87 - 5.11 MIL/uL   Hemoglobin 8.3 (L) 12.0 - 15.0 g/dL   HCT 23.4 (L) 36.0 - 46.0 %   MCV 90.0 78.0 - 100.0 fL   MCH 31.9 26.0 - 34.0 pg   MCHC 35.5 30.0 - 36.0 g/dL   RDW 19.4 (H) 11.5 - 15.5 %   Platelets 578 (H) 150 - 400 K/uL   Neutrophils Relative % 54 %   Neutro Abs 5.9 1.7 - 7.7 K/uL   Lymphocytes Relative 29 %   Lymphs Abs 3.1 0.7 - 4.0 K/uL   Monocytes Relative 12 %   Monocytes Absolute 1.2 (H) 0.1 - 1.0 K/uL   Eosinophils Relative 4 %   Eosinophils Absolute 0.4 0.0 - 0.7 K/uL   Basophils Relative 1 %   Basophils Absolute 0.1 0.0 -  0.1 K/uL  Basic metabolic panel     Status: None   Collection Time: 01/31/17  1:40 AM  Result Value Ref Range   Sodium 138 135 - 145 mmol/L   Potassium 3.9 3.5 - 5.1 mmol/L   Chloride 106 101 - 111 mmol/L   CO2 25 22 - 32 mmol/L   Glucose, Bld 96 65 - 99 mg/dL   BUN 12 6 - 20 mg/dL   Creatinine, Ser 0.72 0.44 - 1.00 mg/dL   Calcium 9.0 8.9 - 10.3 mg/dL   GFR  calc non Af Amer >60 >60 mL/min   GFR calc Af Amer >60 >60 mL/min   Anion gap 7 5 - 15  Reticulocytes     Status: Abnormal   Collection Time: 01/31/17  1:40 AM  Result Value Ref Range   Retic Ct Pct 19.7 (H) 0.4 - 3.1 %   RBC. 2.60 (L) 3.87 - 5.11 MIL/uL   Retic Count, Absolute 512.2 (H) 19.0 - 186.0 K/uL   *Note: Due to a large number of results and/or encounters for the requested time period, some results have not been displayed. A complete set of results can be found in Results Review.   Imaging Studies: No results found.  ED COURSE  Nursing notes and initial vitals signs, including pulse oximetry, reviewed.  Vitals:   01/31/17 0028 01/31/17 0329  BP: 112/85 (!) 104/51  Pulse: 95 91  Resp: 18 17  Temp: 98.5 F (36.9 C)   TempSrc: Oral   SpO2: 96% 96%   5:01 AM Pain improved. Patient ready to go home after third dose of Dilaudid.  PROCEDURES    ED DIAGNOSES     ICD-10-CM   1. Sickle cell anemia with crisis ([place]) D57.00        Brix Brearley, MD 01/31/17 [phone]

## 2017-02-01 ENCOUNTER — Encounter (HOSPITAL_COMMUNITY): Payer: Self-pay | Admitting: Emergency Medicine

## 2017-02-01 ENCOUNTER — Emergency Department (HOSPITAL_COMMUNITY): Payer: Medicare Other

## 2017-02-01 ENCOUNTER — Inpatient Hospital Stay (HOSPITAL_COMMUNITY)
Admission: EM | Admit: 2017-02-01 | Discharge: 2017-02-04 | DRG: 812 | Disposition: A | Discharge status: Home / Self Care | Payer: Medicare Other | Attending: Internal Medicine | Admitting: Internal Medicine

## 2017-02-01 DIAGNOSIS — D5700 Hb-SS disease with crisis, unspecified: Secondary | ICD-10-CM | POA: Diagnosis not present

## 2017-02-01 DIAGNOSIS — R079 Chest pain, unspecified: Secondary | ICD-10-CM | POA: Diagnosis not present

## 2017-02-01 DIAGNOSIS — Z86718 Personal history of other venous thrombosis and embolism: Secondary | ICD-10-CM

## 2017-02-01 DIAGNOSIS — F633 Trichotillomania: Secondary | ICD-10-CM | POA: Diagnosis present

## 2017-02-01 DIAGNOSIS — Z7901 Long term (current) use of anticoagulants: Secondary | ICD-10-CM

## 2017-02-01 DIAGNOSIS — F329 Major depressive disorder, single episode, unspecified: Secondary | ICD-10-CM | POA: Diagnosis present

## 2017-02-01 DIAGNOSIS — B009 Herpesviral infection, unspecified: Secondary | ICD-10-CM | POA: Diagnosis present

## 2017-02-01 LAB — CBC WITH DIFFERENTIAL/PLATELET
Basophils Absolute: 0.1 10*3/uL (ref 0.0–0.1)
Basophils Relative: 0 %
EOS ABS: 0.3 10*3/uL (ref 0.0–0.7)
Eosinophils Relative: 2 %
HCT: 23.1 % — ABNORMAL LOW (ref 36.0–46.0)
HEMOGLOBIN: 8.2 g/dL — AB (ref 12.0–15.0)
LYMPHS ABS: 3 10*3/uL (ref 0.7–4.0)
LYMPHS PCT: 20 %
MCH: 32.2 pg (ref 26.0–34.0)
MCHC: 35.5 g/dL (ref 30.0–36.0)
MCV: 90.6 fL (ref 78.0–100.0)
Monocytes Absolute: 1.6 10*3/uL — ABNORMAL HIGH (ref 0.1–1.0)
Monocytes Relative: 10 %
NEUTROS ABS: 10.4 10*3/uL — AB (ref 1.7–7.7)
NEUTROS PCT: 68 %
Platelets: 542 10*3/uL — ABNORMAL HIGH (ref 150–400)
RBC: 2.55 MIL/uL — ABNORMAL LOW (ref 3.87–5.11)
RDW: 19.4 % — ABNORMAL HIGH (ref 11.5–15.5)
WBC: 15.3 10*3/uL — ABNORMAL HIGH (ref 4.0–10.5)

## 2017-02-01 LAB — RETICULOCYTES
RBC.: 2.55 MIL/uL — ABNORMAL LOW (ref 3.87–5.11)
Retic Count, Absolute: 453.9 10*3/uL — ABNORMAL HIGH (ref 19.0–186.0)
Retic Ct Pct: 17.8 % — ABNORMAL HIGH (ref 0.4–3.1)

## 2017-02-01 LAB — COMPREHENSIVE METABOLIC PANEL
ALT: 29 U/L (ref 14–54)
AST: 25 U/L (ref 15–41)
Albumin: 4.3 g/dL (ref 3.5–5.0)
Alkaline Phosphatase: 65 U/L (ref 38–126)
Anion gap: 8 (ref 5–15)
BUN: 6 mg/dL (ref 6–20)
CHLORIDE: 104 mmol/L (ref 101–111)
CO2: 25 mmol/L (ref 22–32)
CREATININE: 0.63 mg/dL (ref 0.44–1.00)
Calcium: 9.1 mg/dL (ref 8.9–10.3)
GFR calc non Af Amer: >60 mL/min (ref >60)
Glucose, Bld: 91 mg/dL (ref 65–99)
POTASSIUM: 3.7 mmol/L (ref 3.5–5.1)
SODIUM: 137 mmol/L (ref 135–145)
Total Bilirubin: 2.5 mg/dL — ABNORMAL HIGH (ref 0.3–1.2)
Total Protein: 7.3 g/dL (ref 6.5–8.1)

## 2017-02-01 LAB — I-STAT BETA HCG BLOOD, ED (MC, WL, AP ONLY)

## 2017-02-01 LAB — I-STAT TROPONIN, ED: TROPONIN I, POC: 0.03 ng/mL (ref 0.00–0.08)

## 2017-02-01 MED ORDER — HYDROMORPHONE HCL 1 MG/ML IJ SOLN
2.0000 mg | INTRAMUSCULAR | Status: AC
  Administered 2017-02-01: 2 mg via INTRAVENOUS
  Filled 2017-02-01: qty 2

## 2017-02-01 MED ORDER — DIPHENHYDRAMINE HCL 50 MG/ML IJ SOLN
25.0000 mg | Freq: Once | INTRAMUSCULAR | Status: AC
  Administered 2017-02-01: 25 mg via INTRAVENOUS
  Filled 2017-02-01: qty 1

## 2017-02-01 MED ORDER — SODIUM CHLORIDE 0.45 % IV SOLN
INTRAVENOUS | Status: DC
  Administered 2017-02-01 – 2017-02-03 (×4): via INTRAVENOUS

## 2017-02-01 MED ORDER — ONDANSETRON HCL 4 MG/2ML IJ SOLN
4.0000 mg | INTRAMUSCULAR | Status: DC | PRN
  Administered 2017-02-01: 4 mg via INTRAVENOUS
  Filled 2017-02-01: qty 2

## 2017-02-01 MED ORDER — HYDROMORPHONE HCL 1 MG/ML IJ SOLN: 2.0000 mg | INTRAMUSCULAR | Status: AC

## 2017-02-01 MED ORDER — KETOROLAC TROMETHAMINE 15 MG/ML IJ SOLN
15.0000 mg | INTRAMUSCULAR | Status: AC
  Administered 2017-02-01: 15 mg via INTRAVENOUS
  Filled 2017-02-01: qty 1

## 2017-02-01 NOTE — ED Triage Notes (Signed)
Pt presents with sickle cell pain x 1 week. Bilateral lower back pain 10/10. Patient states she was supposed to be admitted last Friday, but had no care for her kids. Has been compliant with meds and sickle cell care plan at home with no relief. Denies N/V.

## 2017-02-02 ENCOUNTER — Encounter (HOSPITAL_COMMUNITY): Payer: Self-pay

## 2017-02-02 DIAGNOSIS — R7989 Other specified abnormal findings of blood chemistry: Secondary | ICD-10-CM | POA: Diagnosis present

## 2017-02-02 DIAGNOSIS — F329 Major depressive disorder, single episode, unspecified: Secondary | ICD-10-CM | POA: Diagnosis present

## 2017-02-02 DIAGNOSIS — Z87891 Personal history of nicotine dependence: Secondary | ICD-10-CM | POA: Diagnosis not present

## 2017-02-02 DIAGNOSIS — Z86718 Personal history of other venous thrombosis and embolism: Secondary | ICD-10-CM | POA: Diagnosis not present

## 2017-02-02 DIAGNOSIS — Z91018 Allergy to other foods: Secondary | ICD-10-CM | POA: Diagnosis not present

## 2017-02-02 DIAGNOSIS — F633 Trichotillomania: Secondary | ICD-10-CM | POA: Diagnosis present

## 2017-02-02 DIAGNOSIS — Z91040 Latex allergy status: Secondary | ICD-10-CM | POA: Diagnosis not present

## 2017-02-02 DIAGNOSIS — Z7901 Long term (current) use of anticoagulants: Secondary | ICD-10-CM | POA: Diagnosis not present

## 2017-02-02 DIAGNOSIS — B009 Herpesviral infection, unspecified: Secondary | ICD-10-CM | POA: Diagnosis present

## 2017-02-02 DIAGNOSIS — Z79899 Other long term (current) drug therapy: Secondary | ICD-10-CM | POA: Diagnosis not present

## 2017-02-02 DIAGNOSIS — D5700 Hb-SS disease with crisis, unspecified: Principal | ICD-10-CM

## 2017-02-02 DIAGNOSIS — Z832 Family history of diseases of the blood and blood-forming organs and certain disorders involving the immune mechanism: Secondary | ICD-10-CM | POA: Diagnosis not present

## 2017-02-02 LAB — URINALYSIS, ROUTINE W REFLEX MICROSCOPIC
Bilirubin Urine: NEGATIVE
GLUCOSE, UA: NEGATIVE mg/dL
HGB URINE DIPSTICK: NEGATIVE
Ketones, ur: NEGATIVE mg/dL
Leukocytes, UA: NEGATIVE
Nitrite: NEGATIVE
PH: 6 (ref 5.0–8.0)
Protein, ur: NEGATIVE mg/dL
Specific Gravity, Urine: 1.011 (ref 1.005–1.030)

## 2017-02-02 MED ORDER — HYDROXYUREA 500 MG PO CAPS
1000.0000 mg | ORAL_CAPSULE | Freq: Every day | ORAL | Status: DC
  Administered 2017-02-02 – 2017-02-04 (×3): 1000 mg via ORAL
  Filled 2017-02-02 (×3): qty 2

## 2017-02-02 MED ORDER — SODIUM CHLORIDE 0.9% FLUSH: 9.0000 mL | INTRAVENOUS | Status: DC | PRN

## 2017-02-02 MED ORDER — VALACYCLOVIR HCL 500 MG PO TABS
1000.0000 mg | ORAL_TABLET | Freq: Every day | ORAL | Status: DC
  Administered 2017-02-02 – 2017-02-04 (×3): 1000 mg via ORAL
  Filled 2017-02-02 (×3): qty 2

## 2017-02-02 MED ORDER — HYDROMORPHONE 1 MG/ML IV SOLN
INTRAVENOUS | Status: DC
  Administered 2017-02-02: 0.11 mg via INTRAVENOUS
  Administered 2017-02-02: 04:00:00 via INTRAVENOUS
  Filled 2017-02-02: qty 25

## 2017-02-02 MED ORDER — POLYETHYLENE GLYCOL 3350 17 G PO PACK: 17.0000 g | PACK | Freq: Every day | ORAL | Status: DC | PRN

## 2017-02-02 MED ORDER — KETOROLAC TROMETHAMINE 30 MG/ML IJ SOLN
30.0000 mg | Freq: Three times a day (TID) | INTRAMUSCULAR | Status: DC
  Administered 2017-02-02 – 2017-02-04 (×8): 30 mg via INTRAVENOUS
  Filled 2017-02-02 (×8): qty 1

## 2017-02-02 MED ORDER — DIPHENHYDRAMINE HCL 50 MG/ML IJ SOLN
25.0000 mg | Freq: Three times a day (TID) | INTRAMUSCULAR | Status: DC | PRN
  Administered 2017-02-02 – 2017-02-03 (×3): 25 mg via INTRAVENOUS
  Filled 2017-02-02 (×3): qty 1

## 2017-02-02 MED ORDER — ONDANSETRON HCL 4 MG/2ML IJ SOLN: 4.0000 mg | Freq: Four times a day (QID) | INTRAMUSCULAR | Status: DC | PRN

## 2017-02-02 MED ORDER — NALOXONE HCL 0.4 MG/ML IJ SOLN: 0.4000 mg | INTRAMUSCULAR | Status: DC | PRN

## 2017-02-02 MED ORDER — HYDROMORPHONE HCL 1 MG/ML IJ SOLN
1.0000 mg | Freq: Once | INTRAMUSCULAR | Status: AC
  Administered 2017-02-02: 1 mg via INTRAVENOUS
  Filled 2017-02-02: qty 1

## 2017-02-02 MED ORDER — HYDROMORPHONE HCL 1 MG/ML IJ SOLN
2.0000 mg | Freq: Once | INTRAMUSCULAR | Status: AC
  Administered 2017-02-02: 2 mg via INTRAVENOUS
  Filled 2017-02-02: qty 2

## 2017-02-02 MED ORDER — HYDROMORPHONE HCL-NACL 0.5-0.9 MG/ML-% IV SOSY
2.0000 mg | PREFILLED_SYRINGE | Freq: Once | INTRAVENOUS | Status: AC
  Administered 2017-02-02: 2 mg via INTRAVENOUS
  Filled 2017-02-02: qty 4

## 2017-02-02 MED ORDER — HYDROMORPHONE 1 MG/ML IV SOLN
INTRAVENOUS | Status: DC
  Administered 2017-02-02: 9.6 mg via INTRAVENOUS
  Administered 2017-02-02: via INTRAVENOUS
  Administered 2017-02-02: 4.79 mg via INTRAVENOUS
  Administered 2017-02-02: 3.01 mg via INTRAVENOUS
  Administered 2017-02-02: 6 mg via INTRAVENOUS
  Administered 2017-02-03: 7.79 mg via INTRAVENOUS
  Administered 2017-02-03: 6.6 mg via INTRAVENOUS
  Administered 2017-02-03: 10:00:00 via INTRAVENOUS
  Administered 2017-02-03: 15.6 mg via INTRAVENOUS
  Administered 2017-02-03: 21:00:00 via INTRAVENOUS
  Administered 2017-02-03: 6.6 mg via INTRAVENOUS
  Administered 2017-02-03: 10.2 mg via INTRAVENOUS
  Administered 2017-02-04: 11.36 mg via INTRAVENOUS
  Administered 2017-02-04: 13:00:00 via INTRAVENOUS
  Administered 2017-02-04: 4.19 mg via INTRAVENOUS
  Administered 2017-02-04: 4.8 mg via INTRAVENOUS
  Administered 2017-02-04: 13.5 mg via INTRAVENOUS
  Filled 2017-02-02 (×4): qty 25

## 2017-02-02 MED ORDER — RIVAROXABAN 20 MG PO TABS
20.0000 mg | ORAL_TABLET | Freq: Every day | ORAL | Status: DC
  Administered 2017-02-02 – 2017-02-04 (×3): 20 mg via ORAL
  Filled 2017-02-02 (×3): qty 1

## 2017-02-02 MED ORDER — ASPIRIN 81 MG PO CHEW
81.0000 mg | CHEWABLE_TABLET | Freq: Every day | ORAL | Status: DC
  Administered 2017-02-02 – 2017-02-04 (×3): 81 mg via ORAL
  Filled 2017-02-02 (×3): qty 1

## 2017-02-02 NOTE — ED Provider Notes (Signed)
[facility] DEPT Provider Note   CSN: [id number] Arrival date & time: 02/01/17  1428     History   Chief Complaint Chief Complaint  Patient presents with  . Sickle Cell Pain Crisis    HPI [name] is a 26 y.o. female.  HPI 26 year old African-American female past medical history significant for sickle cell disease, sickle cell pain crisis, chronic back pain, depression, DVT currently on anticoagulation that presents to the emergency Department today with complaints of ongoing sickle cell pain crisis. The patient states that her pain started approximately 1-1/2 weeks ago. States that she has been trying to manage her pain at home with her home medications. They are not touching her pain. The patient has been seen on 9/13 and 9/18 for same. The patient states that she was told she may need admission however she was unable to stay due to family emergency. The patient states that her pain is slightly improved while in the ED however when she goes home the pain is exacerbated. She states the pain is in her upper back, chest, under her bilateral breast. This is typical of her sickle cell pain. She does state that pain is worse with movement and breathing. The chest pain is pleuritic. 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 Patient denies any associated fever, chills, cough, shortness of breath, abdominal pain, nausea, emesis, urinary symptoms. Patient rates her pain a 10 out of 10 which is consistent with her previous sickle cell pain.  Pt denies any fever, chill, ha, vision changes, lightheadedness, dizziness, congestion, neck pain, sob, cough, abd pain, n/v/d, urinary symptoms, change in bowel habits, melena, hematochezia, lower extremity paresthesias.  Past Medical History:  Diagnosis Date  . Blood transfusion    "lots"  . Blood transfusion without reported diagnosis   . Chronic back pain    "very severe;  have knot in my back; from tight muscle; take RX and exercise for it"  . Depression 01/06/2011  . Exertional dyspnea    "sometimes"  . Genital HSV   . GERD (gastroesophageal reflux disease) 02/17/2011  . Migraines 11/08/11   "@ least twice/month"  . 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  . Mood swings ([place]) 11/08/11   "I go back and forth; real bad"  . Sickle cell anemia ([name])   . Sickle cell anemia with crisis ([place])   . Thrombocytosis ([place]) 11/09/2015  . Trichotillomania    h/o    Patient Active Problem List   Diagnosis Date Noted  . 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 Chronic anticoagulation   . Sickle cell anemia ([place]) 05/19/2016  . History of DVT (deep vein thrombosis) 04/17/2016  . Thrombocytosis ([place]) 11/09/2015  . Hyperbilirubinemia 11/09/2015  . Chronic pain 08/04/2015  . Herpes simplex 07/14/2015  . Anemia of chronic disease   . 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 Hb-SS disease without crisis ([place]) 1992/03/18    Past Surgical History:  Procedure Laterality Date  . CHOLECYSTECTOMY  05/2010  . DILATION AND CURETTAGE OF UTERUS  02/20/11   S/P miscarriage  . IR GENERIC HISTORICAL  12/23/2015   IR FLUORO GUIDE CV LINE RIGHT 12/23/2015 Jacqulynn Cadet, MD [facility]  . IR GENERIC HISTORICAL  12/23/2015   IR US GUIDE VASC ACCESS RIGHT 12/23/2015 Jac[facility]lynn Cadet, MD [facility]    OB History    Drema [place] Term  Preterm AB Living   5 2 2  0 3 2   SAB TAB Ectopic Multiple Live Births   3 0 0 0 2      Obstetric Comments   Miscarried in October 2012 at about 7 weeks       Home Medications    Prior to Admission medications   Medication Sig Start Date End Date Taking? Authorizing Provider  hydroxyurea (HYDREA) 500 MG capsule Take 2 capsules (1,000 mg total) by mouth daily. May take with food to minimize GI side effects. 04/18/16  Yes Tresa Garter, MD  medroxyPROGESTERone (DEPO-PROVERA)  150 MG/ML injection Inject 1 mL (150 mg total) into the muscle every 3 (three) months. 09/21/16  Yes Shelly Bombard, MD  morphine (MS CONTIN) 60 MG 12 hr tablet Take 60 mg by mouth every 12 (twelve) hours as needed for pain.    Yes [provider]  Oxycodone HCl 20 MG TABS Take 1 tablet by mouth every 4 (four) hours as needed (pain).  01/17/17  Yes [provider]  rivaroxaban (XARELTO) 20 MG TABS tablet Take 20 mg by mouth daily with supper.    Yes [provider]  valACYclovir (VALTREX) 1000 MG tablet Take 1 tablet (1,000 mg total) by mouth daily. 05/02/16  Yes [name], MD  aspirin 81 MG chewable tablet Chew 1 tablet (81 mg total) by mouth daily. Patient not taking: Reported on 02/01/2017 12/15/16   [name], MD    Family History Family History  Problem Relation Age of Onset  . Sickle cell trait Mother   . Sickle cell trait Father   . Diabetes Maternal Grandmother   . Diabetes Paternal Grandmother   . Hypertension Paternal Grandmother   . Diabetes Maternal Grandfather     Social History Social History  Substance Use Topics  . Smoking status: Former Smoker    Packs/day: 0.25    Years: 1.00    Types: Cigarettes    Quit date: 03/25/2013  . Smokeless tobacco: Never Used  . Alcohol use No     Comment: denies     Allergies   Food and Latex   Review of Systems Review of Systems  Constitutional: Negative for chills and fever.  HENT: Negative for congestion.   Eyes: Negative for visual disturbance.  Respiratory: Negative for cough and shortness of breath.   Cardiovascular: Positive for chest pain (chest wall).  Gastrointestinal: Negative for abdominal pain, diarrhea, nausea and vomiting.  Genitourinary: Negative for dysuria, flank pain, frequency, hematuria, urgency, vaginal bleeding and vaginal discharge.  Musculoskeletal: Positive for back pain. Negative for arthralgias and myalgias.  Skin: Negative for rash.  Neurological:  Negative for dizziness, syncope, weakness, light-headedness, numbness and headaches.  Psychiatric/Behavioral: Negative for sleep disturbance. The patient is not nervous/anxious.      Physical Exam Updated Vital Signs BP (!) 112/52   Pulse 89   Temp 98.7 F (37.1 C)   Resp 19   Wt 90.7 kg (200 lb)   LMP 01/22/2017 (Exact Date)   SpO2 100%   BMI 37.79 kg/m   Physical Exam  Constitutional: She is oriented to person, place, and time. She appears well-developed and well-nourished.  Non-toxic appearance. No distress.  HENT:  Head: Normocephalic and atraumatic.  Nose: Nose normal.  Mouth/Throat: Oropharynx is clear and moist.  Eyes: Pupils are equal, round, and reactive to light. Conjunctivae are normal. Right eye exhibits no discharge. Left eye exhibits no discharge.  Neck: Normal range of motion. Neck  supple.  Cardiovascular: Normal rate, regular rhythm, normal heart sounds and intact distal pulses.  Exam reveals no gallop and no friction rub.   No murmur heard. Pulmonary/Chest: Effort normal and breath sounds normal. No respiratory distress. She has no wheezes. She has no rales. She exhibits no tenderness.  Abdominal: Soft. Bowel sounds are normal. There is no tenderness. There is no rebound and no guarding.  Musculoskeletal: Normal range of motion. She exhibits no tenderness.  Full range of motion of all joints without any erythema or pain. Skin compartments are soft.  Bilateral parapspinal T spine and  L spine tenderness. No deformities or step offs noted. Full ROM. Pelvis is stable.  Lymphadenopathy:    She has no cervical adenopathy.  Neurological: She is alert and oriented to person, place, and time.  Skin: Skin is warm and dry. Capillary refill takes less than 2 seconds.  Psychiatric: Her behavior is normal. Judgment and thought content normal.  Nursing note and vitals reviewed.    ED Treatments / Results  Labs (all labs ordered are listed, but only abnormal results are  displayed) Labs Reviewed  COMPREHENSIVE METABOLIC PANEL - Abnormal; Notable for the following:       Result Value   Total Bilirubin 2.5 (*)    All other components within normal limits  CBC WITH DIFFERENTIAL/PLATELET - Abnormal; Notable for the following:    WBC 15.3 (*)    RBC 2.55 (*)    Hemoglobin 8.2 (*)    HCT 23.1 (*)    RDW 19.4 (*)    Platelets 542 (*)    Neutro Abs 10.4 (*)    Monocytes Absolute 1.6 (*)    All other components within normal limits  RETICULOCYTES - Abnormal; Notable for the following:    Retic Ct Pct 17.8 (*)    RBC. 2.55 (*)    Retic Count, Absolute 453.9 (*)    All other components within normal limits  URINALYSIS, ROUTINE W REFLEX MICROSCOPIC  I-STAT TROPONIN, ED  I-STAT BETA HCG BLOOD, ED ([facility], [facility], [facility] ONLY)    EKG  EKG Interpretation None       Radiology Dg Chest 2 View  Result Date: 02/01/2017 CLINICAL DATA:  Sickle cell with chest pain EXAM: CHEST  2 VIEW COMPARISON:  11/24/2016 FINDINGS: Right-sided central venous port tip overlies the SVC. No acute pulmonary infiltrate, consolidation, or pleural effusion is seen. Stable cardiomediastinal silhouette. No pneumothorax. Surgical clips in the right upper quadrant. Osseous changes of the spine compatible with history of sickle cell. Flattening of the humeral heads as before. IMPRESSION: No active cardiopulmonary disease. Electronically Signed   By: Donavan Foil M.D.   On: 02/01/2017 22:09    Procedures Procedures (including critical care time)  Medications Ordered in ED Medications  0.45 % sodium chloride infusion ( Intravenous New Bag/Given 02/01/17 2058)  ondansetron (ZOFRAN) injection 4 mg (4 mg Intravenous Given 02/01/17 2057)  ketorolac (TORADOL) 15 MG/ML injection 15 mg (15 mg Intravenous Given 02/01/17 2059)  HYDROmorphone (DILAUDID) injection 2 mg (2 mg Intravenous Given 02/01/17 2058)    Or  HYDROmorphone (DILAUDID) injection 2 mg ( Subcutaneous See Alternative 02/01/17 2058)    HYDROmorphone (DILAUDID) injection 2 mg (2 mg Intravenous Given 02/01/17 2129)    Or  HYDROmorphone (DILAUDID) injection 2 mg ( Subcutaneous See Alternative 02/01/17 2129)  HYDROmorphone (DILAUDID) injection 2 mg (2 mg Intravenous Given 02/01/17 2229)    Or  HYDROmorphone (DILAUDID) injection 2 mg ( Subcutaneous See Alternative 02/01/17 2229)  diphenhydrAMINE (BENADRYL) injection 25 mg (25 mg Intravenous Given 02/01/17 2058)  diphenhydrAMINE (BENADRYL) injection 25 mg (25 mg Intravenous Given 02/01/17 2229)  HYDROmorphone (DILAUDID) injection 1 mg (1 mg Intravenous Given 02/02/17 0048)     Initial Impression / Assessment and Plan / ED Course  I have reviewed the triage vital signs and the nursing notes.  Pertinent labs & imaging results that were available during my care of the patient were reviewed by me and considered in my medical decision making (see chart for details).    Patient presents to the ED with complaints of sickle cell pain crisis. Patient's pain has been ongoing for the past 1-1/2 weeks without relief at home. Patient has been seen 2 times in the past week in the ED for same. Has not been able to be admitted due to family emergency. Patient complains of upper back pain, pleuritic chest pain. This is typical of patient's pain. The patient does have a history of DVT that she is currently owns a relative that she takes daily as prescribed.  Overall patient is nontoxic appearing. Patient does appear to be tearful in room due to pain. Vital signs are reassuring. She is afebrile, no tachycardia, no hypoxia, no tachypnea noted.  Exam is reassuring. Lung sounds are clear to auscultation bilaterally. Abdominal exam is benign. Joints with full range of motion and no erythema or warmth.  Labwork consistent with acute sickle cell pain crisis. Leukocytosis of 15,000 noted. Hemoglobin is 8.2 which is the patient's baseline. Kidney function is normal. Electrolytes are normal. Troponin is  negative. Chest x-ray shows no signs of focal infiltrate. UA shows no signs of infection.   The patient has been Given several rounds of pain medication with little relief in her symptoms. Patient reports ongoing 10 out of 10 pain. Patient is very tearful in the room. Chest pain does not seem to be related to ACS, PE, dissection, pneumonia. Given patient's ongoing significant pain will consult hospital medicine for admission.  Spoke with Dr. Denton Brick with hospital medicine who agrees to admission to come to the ED to evaluate patient. Patient was updated on plan of care and makes tearful. She ambulated to the bathroom with normal gait. Vital signs remained reassuring.  Final Clinical Impressions(s) / ED Diagnoses   Final diagnoses:  Sickle cell pain crisis [facility])    New Prescriptions New Prescriptions   No medications on file     Aaron Edelman 02/02/17 0133    Nat Christen, MD 02/04/17 [phone]

## 2017-02-02 NOTE — Progress Notes (Signed)
Patient ID: [name], female   DOB: 1991-01-13, 26 y.o.   MRN: [id number] Opiate tolerant patient with Hb SS admitted with sickle cell crisis. Orders reviewed and adjusted appropriately. Pt reports that since doses adjusted pain has started to improve. New dose on PCA: bolus- 0.5, lockout 10 minutes, 1 hour total 3 mg, Toradol 30 mg. Continue IVF @ 125 ml/hr.  Pt alert and oriented x 3 and in no apparent distress. Tolerating oral intake well.   MATTHEWS,MICHELLE A.

## 2017-02-02 NOTE — H&P (Signed)
History and Physical    [name] EGB:[id number] DOB: 1990-12-27 DOA: 02/01/2017  PCP: Ricke Hey, MD   Patient coming from: Home  Chief Complaint: Back pain  HPI: [name] is a 26 y.o. female with medical history significant for sickle cell disease ,, depression, DVT on chronic anticoagulation, trichotillomania, herpes simplex. Patient presented to the ED today with complaints of back pain flank areas bilaterally and upper back, over about 5 days duration, pain is similar to her typical sickle cell crisis pain. Patient's was in the ED 2 days ago 01/31/17- with similar complaints. Admission was recommended, but patient elected to go home because she had no one to care for her 47 and 61 year old kids. With persistence of pain, she tried to stay hydrated, and continue the home pain medications without significant improvement. Patient denies chest pain or abdominal pain. One episode of vomiting earlier today, no diarrhea. No dysuria.   ED Course: Initially Pulse 105, later improved. CMP unremarkable. WBC elevated at 15 chronically elevated, stable hemoglobin 8.2, stable reticulocytosis, negative i-STAT troponin. Clean UA. Two-view chest x-ray negative for acute abnormality. Patient received 7 mg of Dilaudid in the ED, IV ketorolac with minimal improvement in pain.  Review of Systems: As per HPI otherwise 10 point review of systems negative.  Past Medical History:  Diagnosis Date  . Blood transfusion    "lots"  . Blood transfusion without reported diagnosis   . Chronic back pain    "very severe; have knot in my back; from tight muscle; take RX and exercise for it"  . Depression 01/06/2011  . Exertional dyspnea    "sometimes"  . Genital HSV   . GERD (gastroesophageal reflux disease) 02/17/2011  . Migraines 11/08/11   "@ least twice/month"  . 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  . Mood swings ([place]) 11/08/11   "I go back and forth; real bad"  . Sickle cell  anemia ([name])   . Sickle cell anemia with crisis ([place])   . Thrombocytosis ([place]) 11/09/2015  . Trichotillomania    h/o    Past Surgical History:  Procedure Laterality Date  . CHOLECYSTECTOMY  05/2010  . DILATION AND CURETTAGE OF UTERUS  02/20/11   S/P miscarriage  . IR GENERIC HISTORICAL  12/23/2015   IR FLUORO GUIDE CV LINE RIGHT 12/23/2015 Jacqulynn Cadet, MD [facility]  . IR GENERIC HISTORICAL  12/23/2015   IR US GUIDE VASC ACCESS RIGHT 12/23/2015 Jacqulynn Cadet, MD [facility]    reports that she quit smoking about 3 years ago. Her smoking use included Cigarettes. She has a 0.25 pack-year smoking history. She has never used smokeless tobacco. She reports that she does not drink alcohol or use drugs.  Allergies  Allergen Reactions  . Food Hives and Other (See Comments)    Pt is allergic to carrots.    . Latex Rash    Family History  Problem Relation Age of Onset  . Sickle cell trait Mother   . Sickle cell trait Father   . Diabetes Maternal Grandmother   . Diabetes Paternal Grandmother   . Hypertension Paternal Grandmother   . Diabetes Maternal Grandfather    Prior to Admission medications   Medication Sig Start Date End Date Taking? Authorizing Provider  hydroxyurea (HYDREA) 500 MG capsule Take 2 capsules (1,000 mg total) by mouth daily. May take with food to minimize GI side effects. 04/18/16  Yes Tresa Garter, MD  medroxyPROGESTERone (DEPO-PROVERA) 150 MG/ML injection Inject  1 mL (150 mg total) into the muscle every 3 (three) months. 09/21/16  Yes Shelly Bombard, MD  morphine (MS CONTIN) 60 MG 12 hr tablet Take 60 mg by mouth every 12 (twelve) hours as needed for pain.    Yes [provider]  Oxycodone HCl 20 MG TABS Take 1 tablet by mouth every 4 (four) hours as needed (pain).  01/17/17  Yes [provider]  rivaroxaban (XARELTO) 20 MG TABS tablet Take 20 mg by mouth daily with supper.    Yes [provider]  valACYclovir (VALTREX) 1000 MG  tablet Take 1 tablet (1,000 mg total) by mouth daily. 05/02/16  Yes [name], MD  aspirin 81 MG chewable tablet Chew 1 tablet (81 mg total) by mouth daily. Patient not taking: Reported on 02/01/2017 12/15/16   [name], MD   Physical Exam: Vitals:   02/01/17 2130 02/01/17 2149 02/02/17 0012 02/02/17 0130  BP: 119/69 119/69 (!) 112/52 120/64  Pulse: 95 82 89 98  Resp:  11 19 18   Temp:      SpO2: 96% 94% 100% 95%  Weight:        Constitutional: NAD, calm, unComfortable, tearful Vitals:   02/01/17 2130 02/01/17 2149 02/02/17 0012 02/02/17 0130  BP: 119/69 119/69 (!) 112/52 120/64  Pulse: 95 82 89 98  Resp:  11 19 18   Temp:      SpO2: 96% 94% 100% 95%  Weight:       Eyes: PERRL, lids and conjunctivae normal ENMT: Mucous membranes are moist. Posterior pharynx clear of any exudate or lesions.Normal dentition.  Neck: normal, supple, no masses, no thyromegaly Respiratory: clear to auscultation bilaterally, no wheezing, no crackles. Not able to take deep breaths due to pain Cardiovascular: Regular rate and rhythm, no murmurs / rubs / gallops. No extremity edema. 2+ pedal pulses. No carotid bruits.  Abdomen: no tenderness, no masses palpated. No hepatosplenomegaly. Bowel sounds positive.  Musculoskeletal: no clubbing / cyanosis. No joint deformity upper and lower extremities. Good ROM, no contractures. Normal muscle tone. Tenderness back Skin: no rashes, lesions, ulcers. No induration Neurologic: CN 2-12 grossly intact. Sensation intact, DTR normal. Strength 5/5 in all 4.  Psychiatric: Normal judgment and insight. Alert and oriented x 3. Normal mood.   Labs on Admission: I have personally reviewed following labs and imaging studies  CBC:  Recent Labs Lab 01/26/17 0808 01/31/17 0140 02/01/17 2045  WBC 13.5* 10.7* 15.3*  NEUTROABS 9.5* 5.9 10.4*  HGB 7.5* 8.3* 8.2*  HCT 22.3* 23.4* 23.1*  MCV 89.2 90.0 90.6  PLT 553* 578* 161*   Basic Metabolic  Panel:  Recent Labs Lab 01/26/17 0808 01/31/17 0140 02/01/17 2045  NA 137 138 137  K 3.6 3.9 3.7  CL 107 106 104  CO2 24 25 25   GLUCOSE 105* 96 91  BUN 9 12 6   CREATININE 0.92 0.72 0.63  CALCIUM 8.6* 9.0 9.1   GFR: Estimated Creatinine Clearance: 110.3 mL/min (by C-G formula based on SCr of 0.63 mg/dL). Liver Function Tests:  Recent Labs Lab 02/01/17 2045  AST 25  ALT 29  ALKPHOS 65  BILITOT 2.5*  PROT 7.3  ALBUMIN 4.3   Anemia Panel:  Recent Labs  01/31/17 0140 02/01/17 2045  RETICCTPCT 19.7* 17.8*   Urine analysis:    Component Value Date/Time   COLORURINE YELLOW 02/02/2017 0027   APPEARANCEUR CLEAR 02/02/2017 0027   APPEARANCEUR Clear 05/06/2012 1546   LABSPEC 1.011 02/02/2017 0027   LABSPEC 1.011  05/06/2012 1546   PHURINE 6.0 02/02/2017 0027   GLUCOSEU NEGATIVE 02/02/2017 0027   GLUCOSEU Negative 05/06/2012 1546   HGBUR NEGATIVE 02/02/2017 0027   HGBUR negative 01/08/2009 1329   BILIRUBINUR NEGATIVE 02/02/2017 0027   BILIRUBINUR Negative 05/06/2012 1546   KETONESUR NEGATIVE 02/02/2017 0027   PROTEINUR NEGATIVE 02/02/2017 0027   UROBILINOGEN 2.0 (H) 10/26/2015 0817   NITRITE NEGATIVE 02/02/2017 0027   LEUKOCYTESUR NEGATIVE 02/02/2017 0027   LEUKOCYTESUR Negative 05/06/2012 1546    Radiological Exams on Admission: Dg Chest 2 View  Result Date: 02/01/2017 CLINICAL DATA:  Sickle cell with chest pain EXAM: CHEST  2 VIEW COMPARISON:  11/24/2016 FINDINGS: Right-sided central venous port tip overlies the SVC. No acute pulmonary infiltrate, consolidation, or pleural effusion is seen. Stable cardiomediastinal silhouette. No pneumothorax. Surgical clips in the right upper quadrant. Osseous changes of the spine compatible with history of sickle cell. Flattening of the humeral heads as before. IMPRESSION: No active cardiopulmonary disease. Electronically Signed   By: Donavan Foil M.D.   On: 02/01/2017 22:09    EKG: None  Assessment/Plan Principal  Problem:   Sickle cell pain crisis ([name]) Active Problems:   Trichotillomania   Major depression, chronic   Herpes simplex   History of DVT (deep vein thrombosis)   Chronic anticoagulation   Sickle cell pain crisis- no symptoms to suggest infection as etiology- with negative UA and chest x-ray. WBC 15 more elevated compared to prior- likely stress reaction. - PCA pump weight-based protocol - Continue hydroxyurea - D5 half-normal saline - Continue scheduled Toradol 30mg  Iv q8H  DVT-  - compliant with rivaroxaban, continue  Chronic Thrombocytosis-542. - Continue aspirin  DVT prophylaxis: Rivaroxaban Code Status: Full  Family Communication: None at bedside Disposition Plan: Home Consults called: None  Admission status: Obs, tele    Bethena Roys MD [facility] Pager [phone]  If 7PM-7AM, please contact night-coverage [email or web address] Password [email or web address]  02/02/2017, 2:09 AM

## 2017-02-02 NOTE — ED Notes (Signed)
Please call report to Melissa at [phone] at [phone]. Thanks

## 2017-02-03 MED ORDER — DIPHENHYDRAMINE HCL 25 MG PO CAPS
25.0000 mg | ORAL_CAPSULE | Freq: Four times a day (QID) | ORAL | Status: DC | PRN
  Filled 2017-02-03: qty 1

## 2017-02-03 MED ORDER — HYDROMORPHONE HCL 2 MG/ML IJ SOLN: 2.0000 mg | Freq: Once | INTRAMUSCULAR | Status: DC

## 2017-02-03 MED ORDER — HYDROMORPHONE HCL-NACL 0.5-0.9 MG/ML-% IV SOSY
2.0000 mg | PREFILLED_SYRINGE | Freq: Once | INTRAVENOUS | Status: AC
  Administered 2017-02-03: 2 mg via INTRAVENOUS
  Filled 2017-02-03: qty 4

## 2017-02-03 MED ORDER — SODIUM CHLORIDE 0.9 % IV SOLN
25.0000 mg | INTRAVENOUS | Status: DC | PRN
  Filled 2017-02-03: qty 0.5

## 2017-02-03 MED ORDER — DIPHENHYDRAMINE HCL 50 MG/ML IJ SOLN
25.0000 mg | Freq: Four times a day (QID) | INTRAMUSCULAR | Status: DC | PRN
  Administered 2017-02-03 – 2017-02-04 (×4): 25 mg via INTRAVENOUS
  Filled 2017-02-03 (×4): qty 1

## 2017-02-03 NOTE — Progress Notes (Signed)
Subjective: A 26 year old female painful crisis. Patient is on Dilaudid PCA and Toradol in the moment. She is also on her hydroxyurea. She is being treated for UTI as well.She has chronic thrombocytosis among other things. Patient is on chronic anticoagulation with Xarelto. She is complaining of 7 out of 10 pain in her lower back. Associated with some nausea.She was unable to get pain medication last night which made it worse. She is currently back on the PCA.  Objective: Vital signs in last 24 hours: Temp:  [97.5 F (36.4 C)-98.1 F (36.7 C)] 97.5 F (36.4 C) (09/21 0602) Pulse Rate:  [98-101] 101 (09/21 0602) Resp:  [10-18] 13 (09/21 1638) BP: (120-126)/(81-82) 120/81 (09/21 0602) SpO2:  [93 %-97 %] 93 % (09/21 1638) Weight change:  Last BM Date: 02/02/17  Intake/Output from previous day: 09/20 0701 - 09/21 0700 In: 3360 [P.O.:360; I.V.:3000] Out: 0  Intake/Output this shift: No intake/output data recorded.  General appearance: alert, cooperative, appears stated age and no distress Resp: clear to auscultation bilaterally Chest wall: no tenderness Cardio: regular rate and rhythm, S1, S2 normal, no murmur, click, rub or gallop GI: soft, non-tender; bowel sounds normal; no masses,  no organomegaly Extremities: extremities normal, atraumatic, no cyanosis or edema Pulses: 2+ and symmetric Skin: Skin color, texture, turgor normal. No rashes or lesions Neurologic: Grossly normal  Lab Results:  Recent Labs  02/01/17 2045  WBC 15.3*  HGB 8.2*  HCT 23.1*  PLT 542*   BMET  Recent Labs  02/01/17 2045  NA 137  K 3.7  CL 104  CO2 25  GLUCOSE 91  BUN 6  CREATININE 0.63  CALCIUM 9.1    Studies/Results: Dg Chest 2 View  Result Date: 02/01/2017 CLINICAL DATA:  Sickle cell with chest pain EXAM: CHEST  2 VIEW COMPARISON:  11/24/2016 FINDINGS: Right-sided central venous port tip overlies the SVC. No acute pulmonary infiltrate, consolidation, or pleural effusion is seen.  Stable cardiomediastinal silhouette. No pneumothorax. Surgical clips in the right upper quadrant. Osseous changes of the spine compatible with history of sickle cell. Flattening of the humeral heads as before. IMPRESSION: No active cardiopulmonary disease. Electronically Signed   By: Donavan Foil M.D.   On: 02/01/2017 22:09    Medications: I have reviewed the patient's current medications.  Assessment/Plan: Patient is a 26 year old female admitted with sickle cell painful crisis.  #1 sickle cell painful crisis: Patient is to be maintain on current regimen of Toradol IV fluids as well as Dilaudid PCA. We will continue close monitoring.  #2 thrombocytosis: This is chronic. Continue close monitoring.  #3 history of DVT: Continue anticoagulation  #4 sickle cell anemia: H&H appears stable. Continue monitoring  LOS: 1 day   Vong Garringer,LAWAL 02/03/2017, 7:55 PM

## 2017-02-04 MED ORDER — HEPARIN SOD (PORK) LOCK FLUSH 100 UNIT/ML IV SOLN
500.0000 [IU] | INTRAVENOUS | Status: AC | PRN
  Administered 2017-02-04: 500 [IU]

## 2017-02-04 NOTE — Progress Notes (Signed)
Pt requesting to leave because of child care issues. MD notified.

## 2017-02-04 NOTE — Progress Notes (Signed)
PHARMACIST - PHYSICIAN COMMUNICATION DR:   Zigmund Daniel CONCERNING: IV to Oral Route Change Policy  RECOMMENDATION: This patient is receiving diphenhydramine by the intravenous route.  Based on criteria approved by the Pharmacy and Therapeutics Committee, intravenous diphenhydramine is being converted to the equivalent oral dose form(s).   DESCRIPTION: These criteria include:  Diphenhydramine is not prescribed to treat or prevent a severe allergic reaction  Diphenhydramine is not prescribed as premedication prior to receiving blood product, biologic medication, antimicrobial, or chemotherapy agent  The patient has tolerated at least one dose of an oral or enteral medication  The patient has no evidence of active gastrointestinal bleeding or impaired GI absorption (gastrectomy, short bowel, patient on TNA or NPO).  The patient is not undergoing procedural sedation   If you have questions about this conversion, please contact the Pharmacy Department    [phone])  [place] PharmD, [place] Pager [phone] 02/04/2017 2:34 PM

## 2017-02-04 NOTE — Discharge Summary (Signed)
Physician Discharge Summary  Patient ID: [name] MRN: [id number] DOB/AGE: 25/30/1992 25 y.o.  Admit date: 02/01/2017 Discharge date: 02/04/2017  Admission Diagnoses:  Discharge Diagnoses:  Principal Problem:   Sickle cell pain crisis ([place]) Active Problems:   Trichotillomania   Major depression, chronic   Herpes simplex   History of DVT (deep vein thrombosis)   Chronic anticoagulation   Discharged Condition: good  Hospital Course: a 26 year old female admitted with sickle cell painful crisis. Patient's pain was 10 out of 10 on admission. She was on Dilaudid PCA and Toradol and she was doing much better. Patient developed agitation the next day. Her pain was down to 6 out of 10. She was told she was not at goal for discharge however she has a problem with her kids being home with no one to care for them. She asked to be discharged home so she could follow-up with her PCP. Patient was subsequently discharged home. She is to resume her home regimen of pain medications.  Consults: None  Significant Diagnostic Studies: labs: serial CBCs and CMP is where checked. Patient has done much better at baseline.  Treatments: IV hydration and analgesia: Dilaudid  Discharge Exam: Blood pressure 108/64, pulse 92, temperature 99 F (37.2 C), temperature source Oral, resp. rate 16, height 5' (1.524 m), weight 91.2 kg (201 lb), last menstrual period 01/22/2017, SpO2 98 %, not currently breastfeeding. General appearance: alert, cooperative, appears stated age and no distress Neck: no adenopathy, no carotid bruit, no JVD, supple, symmetrical, trachea midline and thyroid not enlarged, symmetric, no tenderness/mass/nodules Back: symmetric, no curvature. ROM normal. No CVA tenderness. Resp: clear to auscultation bilaterally Cardio: regular rate and rhythm, S1, S2 normal, no murmur, click, rub or gallop GI: soft, non-tender; bowel sounds normal; no masses,  no organomegaly Extremities:  extremities normal, atraumatic, no cyanosis or edema Pulses: 2+ and symmetric Skin: Skin color, texture, turgor normal. No rashes or lesions Neurologic: Grossly normal  Disposition: 01-Home or Self Care  Discharge Instructions    Diet - low sodium heart healthy    Complete by:  As directed    Increase activity slowly    Complete by:  As directed      Allergies as of 02/04/2017      Reactions   Food Hives, Other (See Comments)   Pt is allergic to carrots.     Latex Rash      Medication List    TAKE these medications   aspirin 81 MG chewable tablet Chew 1 tablet (81 mg total) by mouth daily.   hydroxyurea 500 MG capsule Commonly known as:  HYDREA Take 2 capsules (1,000 mg total) by mouth daily. May take with food to minimize GI side effects.   medroxyPROGESTERone 150 MG/ML injection Commonly known as:  DEPO-PROVERA Inject 1 mL (150 mg total) into the muscle every 3 (three) months.   morphine 60 MG 12 hr tablet Commonly known as:  MS CONTIN Take 60 mg by mouth every 12 (twelve) hours as needed for pain.   Oxycodone HCl 20 MG Tabs Take 1 tablet by mouth every 4 (four) hours as needed (pain).   rivaroxaban 20 MG Tabs tablet Commonly known as:  XARELTO Take 20 mg by mouth daily with supper.   valACYclovir 1000 MG tablet Commonly known as:  VALTREX Take 1 tablet (1,000 mg total) by mouth daily.            Discharge Care Instructions        Start  Ordered   02/04/17 0000  Increase activity slowly     02/04/17 1725   02/04/17 0000  Diet - low sodium heart healthy     02/04/17 1725       Signed: GARBA,LAWAL 02/04/2017, 5:25 PM   Time spent is 32 minutes

## 2017-02-27 ENCOUNTER — Emergency Department (HOSPITAL_COMMUNITY): Payer: Medicare Other

## 2017-02-27 ENCOUNTER — Inpatient Hospital Stay (HOSPITAL_COMMUNITY)
Admission: EM | Admit: 2017-02-27 | Discharge: 2017-03-05 | DRG: 812 | Disposition: A | Discharge status: Home / Self Care | Payer: Medicare Other | Attending: Internal Medicine | Admitting: Internal Medicine

## 2017-02-27 DIAGNOSIS — Z87891 Personal history of nicotine dependence: Secondary | ICD-10-CM | POA: Diagnosis not present

## 2017-02-27 DIAGNOSIS — Z91018 Allergy to other foods: Secondary | ICD-10-CM | POA: Diagnosis not present

## 2017-02-27 DIAGNOSIS — Z832 Family history of diseases of the blood and blood-forming organs and certain disorders involving the immune mechanism: Secondary | ICD-10-CM

## 2017-02-27 DIAGNOSIS — G894 Chronic pain syndrome: Secondary | ICD-10-CM | POA: Diagnosis not present

## 2017-02-27 DIAGNOSIS — Z91040 Latex allergy status: Secondary | ICD-10-CM

## 2017-02-27 DIAGNOSIS — R6510 Systemic inflammatory response syndrome (SIRS) of non-infectious origin without acute organ dysfunction: Secondary | ICD-10-CM | POA: Diagnosis present

## 2017-02-27 DIAGNOSIS — Z7982 Long term (current) use of aspirin: Secondary | ICD-10-CM | POA: Diagnosis not present

## 2017-02-27 DIAGNOSIS — R1030 Lower abdominal pain, unspecified: Secondary | ICD-10-CM | POA: Diagnosis not present

## 2017-02-27 DIAGNOSIS — D638 Anemia in other chronic diseases classified elsewhere: Secondary | ICD-10-CM | POA: Diagnosis not present

## 2017-02-27 DIAGNOSIS — D62 Acute posthemorrhagic anemia: Secondary | ICD-10-CM | POA: Diagnosis present

## 2017-02-27 DIAGNOSIS — G8929 Other chronic pain: Secondary | ICD-10-CM

## 2017-02-27 DIAGNOSIS — Z86718 Personal history of other venous thrombosis and embolism: Secondary | ICD-10-CM

## 2017-02-27 DIAGNOSIS — Z833 Family history of diabetes mellitus: Secondary | ICD-10-CM | POA: Diagnosis not present

## 2017-02-27 DIAGNOSIS — R1032 Left lower quadrant pain: Secondary | ICD-10-CM

## 2017-02-27 DIAGNOSIS — Z7901 Long term (current) use of anticoagulants: Secondary | ICD-10-CM | POA: Diagnosis not present

## 2017-02-27 DIAGNOSIS — R079 Chest pain, unspecified: Secondary | ICD-10-CM

## 2017-02-27 DIAGNOSIS — D5700 Hb-SS disease with crisis, unspecified: Principal | ICD-10-CM

## 2017-02-27 DIAGNOSIS — R071 Chest pain on breathing: Secondary | ICD-10-CM | POA: Diagnosis not present

## 2017-02-27 DIAGNOSIS — Z9049 Acquired absence of other specified parts of digestive tract: Secondary | ICD-10-CM

## 2017-02-27 DIAGNOSIS — R52 Pain, unspecified: Secondary | ICD-10-CM

## 2017-02-27 DIAGNOSIS — K219 Gastro-esophageal reflux disease without esophagitis: Secondary | ICD-10-CM | POA: Diagnosis not present

## 2017-02-27 DIAGNOSIS — D75839 Thrombocytosis, unspecified: Secondary | ICD-10-CM | POA: Diagnosis present

## 2017-02-27 DIAGNOSIS — R208 Other disturbances of skin sensation: Secondary | ICD-10-CM

## 2017-02-27 DIAGNOSIS — D473 Essential (hemorrhagic) thrombocythemia: Secondary | ICD-10-CM | POA: Diagnosis present

## 2017-02-27 DIAGNOSIS — Z8249 Family history of ischemic heart disease and other diseases of the circulatory system: Secondary | ICD-10-CM

## 2017-02-27 DIAGNOSIS — R0602 Shortness of breath: Secondary | ICD-10-CM | POA: Diagnosis not present

## 2017-02-27 LAB — BASIC METABOLIC PANEL
Anion gap: 8 (ref 5–15)
BUN: 6 mg/dL (ref 6–20)
CALCIUM: 7.9 mg/dL — AB (ref 8.9–10.3)
CHLORIDE: 111 mmol/L (ref 101–111)
CO2: 21 mmol/L — ABNORMAL LOW (ref 22–32)
CREATININE: 0.52 mg/dL (ref 0.44–1.00)
Glucose, Bld: 82 mg/dL (ref 65–99)
Potassium: 3.5 mmol/L (ref 3.5–5.1)
SODIUM: 140 mmol/L (ref 135–145)

## 2017-02-27 LAB — I-STAT TROPONIN, ED: Troponin i, poc: 0 ng/mL (ref 0.00–0.08)

## 2017-02-27 LAB — RETICULOCYTES
RBC.: 2.9 MIL/uL — AB (ref 3.87–5.11)
RETIC COUNT ABSOLUTE: 591.6 10*3/uL — AB (ref 19.0–186.0)
Retic Ct Pct: 20.4 % — ABNORMAL HIGH (ref 0.4–3.1)

## 2017-02-27 LAB — CBC
HCT: 25.7 % — ABNORMAL LOW (ref 36.0–46.0)
Hemoglobin: 8.6 g/dL — ABNORMAL LOW (ref 12.0–15.0)
MCH: 29.7 pg (ref 26.0–34.0)
MCHC: 33.5 g/dL (ref 30.0–36.0)
MCV: 88.6 fL (ref 78.0–100.0)
PLATELETS: 593 10*3/uL — AB (ref 150–400)
RBC: 2.9 MIL/uL — AB (ref 3.87–5.11)
RDW: 19.7 % — ABNORMAL HIGH (ref 11.5–15.5)
WBC: 22.8 10*3/uL — ABNORMAL HIGH (ref 4.0–10.5)

## 2017-02-27 LAB — D-DIMER, QUANTITATIVE: D-Dimer, Quant: 1.1 ug{FEU}/mL — ABNORMAL HIGH (ref 0.00–0.50)

## 2017-02-27 MED ORDER — HYDROMORPHONE HCL 1 MG/ML IJ SOLN: 2.0000 mg | INTRAMUSCULAR | Status: AC

## 2017-02-27 MED ORDER — HYDROMORPHONE HCL 1 MG/ML IJ SOLN
2.0000 mg | INTRAMUSCULAR | Status: AC
  Administered 2017-02-28: 2 mg via INTRAVENOUS
  Filled 2017-02-27: qty 2

## 2017-02-27 MED ORDER — IOPAMIDOL (ISOVUE-370) INJECTION 76%
INTRAVENOUS | Status: AC
  Administered 2017-02-27: 100 mL
  Filled 2017-02-27: qty 100

## 2017-02-27 MED ORDER — DIPHENHYDRAMINE HCL 50 MG/ML IJ SOLN
25.0000 mg | Freq: Once | INTRAMUSCULAR | Status: AC
  Administered 2017-02-27: 25 mg via INTRAVENOUS
  Filled 2017-02-27: qty 1

## 2017-02-27 MED ORDER — SODIUM CHLORIDE 0.9 % IV BOLUS (SEPSIS)
1000.0000 mL | Freq: Once | INTRAVENOUS | Status: AC
  Administered 2017-02-27: 1000 mL via INTRAVENOUS

## 2017-02-27 MED ORDER — HYDROMORPHONE HCL 1 MG/ML IJ SOLN
2.0000 mg | INTRAMUSCULAR | Status: AC
  Administered 2017-02-28: 2 mg via INTRAVENOUS

## 2017-02-27 MED ORDER — HYDROMORPHONE HCL 1 MG/ML IJ SOLN
2.0000 mg | INTRAMUSCULAR | Status: AC
  Filled 2017-02-27: qty 2

## 2017-02-27 MED ORDER — HYDROMORPHONE HCL 1 MG/ML IJ SOLN
2.0000 mg | INTRAMUSCULAR | Status: AC
  Administered 2017-02-27: 2 mg via INTRAVENOUS
  Filled 2017-02-27: qty 2

## 2017-02-27 NOTE — ED Triage Notes (Signed)
Patient from home for evaluation of chest pain that has increased after using incentive spirometry.  Hx of PE and on xarelto, no access to meds in a week.  Central chest pain and also sickle cell pain in legs. A&Ox4
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
## 2017-02-28 ENCOUNTER — Encounter (HOSPITAL_COMMUNITY): Payer: Self-pay

## 2017-02-28 DIAGNOSIS — Z8249 Family history of ischemic heart disease and other diseases of the circulatory system: Secondary | ICD-10-CM | POA: Diagnosis not present

## 2017-02-28 DIAGNOSIS — Z832 Family history of diseases of the blood and blood-forming organs and certain disorders involving the immune mechanism: Secondary | ICD-10-CM | POA: Diagnosis not present

## 2017-02-28 DIAGNOSIS — Z7901 Long term (current) use of anticoagulants: Secondary | ICD-10-CM | POA: Diagnosis not present

## 2017-02-28 DIAGNOSIS — R52 Pain, unspecified: Secondary | ICD-10-CM | POA: Diagnosis not present

## 2017-02-28 DIAGNOSIS — M545 Low back pain: Secondary | ICD-10-CM | POA: Diagnosis not present

## 2017-02-28 DIAGNOSIS — R208 Other disturbances of skin sensation: Secondary | ICD-10-CM | POA: Diagnosis not present

## 2017-02-28 DIAGNOSIS — M79605 Pain in left leg: Secondary | ICD-10-CM | POA: Diagnosis not present

## 2017-02-28 DIAGNOSIS — R1032 Left lower quadrant pain: Secondary | ICD-10-CM | POA: Diagnosis not present

## 2017-02-28 DIAGNOSIS — Z87891 Personal history of nicotine dependence: Secondary | ICD-10-CM | POA: Diagnosis not present

## 2017-02-28 DIAGNOSIS — Z9049 Acquired absence of other specified parts of digestive tract: Secondary | ICD-10-CM | POA: Diagnosis not present

## 2017-02-28 DIAGNOSIS — D5700 Hb-SS disease with crisis, unspecified: Secondary | ICD-10-CM | POA: Diagnosis not present

## 2017-02-28 DIAGNOSIS — D638 Anemia in other chronic diseases classified elsewhere: Secondary | ICD-10-CM | POA: Diagnosis not present

## 2017-02-28 DIAGNOSIS — Z833 Family history of diabetes mellitus: Secondary | ICD-10-CM | POA: Diagnosis not present

## 2017-02-28 DIAGNOSIS — Z86718 Personal history of other venous thrombosis and embolism: Secondary | ICD-10-CM | POA: Diagnosis not present

## 2017-02-28 DIAGNOSIS — R079 Chest pain, unspecified: Secondary | ICD-10-CM | POA: Diagnosis not present

## 2017-02-28 DIAGNOSIS — Z91018 Allergy to other foods: Secondary | ICD-10-CM | POA: Diagnosis not present

## 2017-02-28 DIAGNOSIS — D62 Acute posthemorrhagic anemia: Secondary | ICD-10-CM | POA: Diagnosis present

## 2017-02-28 DIAGNOSIS — R6510 Systemic inflammatory response syndrome (SIRS) of non-infectious origin without acute organ dysfunction: Secondary | ICD-10-CM | POA: Diagnosis present

## 2017-02-28 DIAGNOSIS — Z7982 Long term (current) use of aspirin: Secondary | ICD-10-CM | POA: Diagnosis not present

## 2017-02-28 DIAGNOSIS — Z91040 Latex allergy status: Secondary | ICD-10-CM | POA: Diagnosis not present

## 2017-02-28 DIAGNOSIS — K5900 Constipation, unspecified: Secondary | ICD-10-CM | POA: Diagnosis not present

## 2017-02-28 DIAGNOSIS — D473 Essential (hemorrhagic) thrombocythemia: Secondary | ICD-10-CM | POA: Diagnosis not present

## 2017-02-28 DIAGNOSIS — K219 Gastro-esophageal reflux disease without esophagitis: Secondary | ICD-10-CM | POA: Diagnosis present

## 2017-02-28 DIAGNOSIS — N939 Abnormal uterine and vaginal bleeding, unspecified: Secondary | ICD-10-CM | POA: Diagnosis not present

## 2017-02-28 DIAGNOSIS — R0602 Shortness of breath: Secondary | ICD-10-CM | POA: Diagnosis not present

## 2017-02-28 DIAGNOSIS — G894 Chronic pain syndrome: Secondary | ICD-10-CM | POA: Diagnosis not present

## 2017-02-28 MED ORDER — KETOROLAC TROMETHAMINE 30 MG/ML IJ SOLN
30.0000 mg | Freq: Four times a day (QID) | INTRAMUSCULAR | Status: AC
  Administered 2017-02-28 – 2017-03-04 (×19): 30 mg via INTRAVENOUS
  Filled 2017-02-28 (×21): qty 1

## 2017-02-28 MED ORDER — POLYETHYLENE GLYCOL 3350 17 G PO PACK: 17.0000 g | PACK | Freq: Every day | ORAL | Status: DC | PRN

## 2017-02-28 MED ORDER — DIPHENHYDRAMINE HCL 25 MG PO CAPS: 25.0000 mg | ORAL_CAPSULE | ORAL | Status: DC | PRN

## 2017-02-28 MED ORDER — OXYCODONE HCL 5 MG PO TABS
10.0000 mg | ORAL_TABLET | Freq: Once | ORAL | Status: AC
  Administered 2017-03-01: 10 mg via ORAL
  Filled 2017-02-28 (×2): qty 2

## 2017-02-28 MED ORDER — KETOROLAC TROMETHAMINE 30 MG/ML IJ SOLN: 30.0000 mg | Freq: Three times a day (TID) | INTRAMUSCULAR | Status: DC

## 2017-02-28 MED ORDER — DIPHENHYDRAMINE HCL 50 MG/ML IJ SOLN
25.0000 mg | Freq: Once | INTRAMUSCULAR | Status: AC
  Administered 2017-02-28: 25 mg via INTRAVENOUS
  Filled 2017-02-28: qty 1

## 2017-02-28 MED ORDER — RIVAROXABAN 20 MG PO TABS
20.0000 mg | ORAL_TABLET | Freq: Every day | ORAL | Status: DC
  Administered 2017-02-28 – 2017-03-05 (×5): 20 mg via ORAL
  Filled 2017-02-28 (×5): qty 1

## 2017-02-28 MED ORDER — SENNOSIDES-DOCUSATE SODIUM 8.6-50 MG PO TABS
1.0000 | ORAL_TABLET | Freq: Two times a day (BID) | ORAL | Status: DC
  Administered 2017-02-28 – 2017-03-05 (×11): 1 via ORAL
  Filled 2017-02-28 (×11): qty 1

## 2017-02-28 MED ORDER — SODIUM CHLORIDE 0.9 % IV BOLUS (SEPSIS): 1000.0000 mL | Freq: Once | INTRAVENOUS | Status: DC

## 2017-02-28 MED ORDER — SODIUM CHLORIDE 0.45 % IV SOLN
INTRAVENOUS | Status: DC
  Administered 2017-02-28 (×2): via INTRAVENOUS

## 2017-02-28 MED ORDER — SODIUM CHLORIDE 0.9% FLUSH: 9.0000 mL | INTRAVENOUS | Status: DC | PRN

## 2017-02-28 MED ORDER — SODIUM CHLORIDE 0.9 % IV SOLN
25.0000 mg | INTRAVENOUS | Status: DC | PRN
  Filled 2017-02-28: qty 0.5

## 2017-02-28 MED ORDER — NALOXONE HCL 0.4 MG/ML IJ SOLN: 0.4000 mg | INTRAMUSCULAR | Status: DC | PRN

## 2017-02-28 MED ORDER — HYDROXYUREA 500 MG PO CAPS
1000.0000 mg | ORAL_CAPSULE | Freq: Every day | ORAL | Status: DC
  Administered 2017-02-28 – 2017-03-01 (×2): 1000 mg via ORAL
  Filled 2017-02-28 (×2): qty 2

## 2017-02-28 MED ORDER — ASPIRIN 81 MG PO CHEW
81.0000 mg | CHEWABLE_TABLET | Freq: Every day | ORAL | Status: DC
  Administered 2017-02-28 – 2017-03-05 (×6): 81 mg via ORAL
  Filled 2017-02-28 (×6): qty 1

## 2017-02-28 MED ORDER — HYDROMORPHONE 1 MG/ML IV SOLN
INTRAVENOUS | Status: DC
  Administered 2017-02-28: 2.4 mg via INTRAVENOUS
  Administered 2017-02-28: 8.98 mg via INTRAVENOUS
  Administered 2017-02-28: 25 mg via INTRAVENOUS
  Administered 2017-02-28: 10.8 mg via INTRAVENOUS
  Administered 2017-02-28: 9.6 mg via INTRAVENOUS
  Administered 2017-02-28: 8.4 mg via INTRAVENOUS
  Administered 2017-02-28: 25 mg via INTRAVENOUS
  Administered 2017-02-28: 11.4 mg via INTRAVENOUS
  Administered 2017-03-01: 13.8 mg via INTRAVENOUS
  Filled 2017-02-28 (×3): qty 25

## 2017-02-28 MED ORDER — ONDANSETRON HCL 4 MG/2ML IJ SOLN: 4.0000 mg | Freq: Four times a day (QID) | INTRAMUSCULAR | Status: DC | PRN

## 2017-02-28 MED ORDER — MORPHINE SULFATE ER 15 MG PO TBCR
60.0000 mg | EXTENDED_RELEASE_TABLET | Freq: Two times a day (BID) | ORAL | Status: DC
  Administered 2017-02-28 – 2017-03-05 (×11): 60 mg via ORAL
  Filled 2017-02-28 (×11): qty 4

## 2017-02-28 MED ORDER — VALACYCLOVIR HCL 500 MG PO TABS
1000.0000 mg | ORAL_TABLET | Freq: Every day | ORAL | Status: DC
  Administered 2017-02-28 – 2017-03-05 (×6): 1000 mg via ORAL
  Filled 2017-02-28 (×6): qty 2
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
## 2017-02-28 NOTE — Progress Notes (Signed)
Patient ID: [name], female   DOB: August 13, 1990, 26 y.o.   MRN: [id number]  Pt admitted this morning with sickle cell crisis. Labs and orders reviewed. No problems noted. Will re-assess tomorrow.   Anwar Sakata A.
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
## 2017-02-28 NOTE — Progress Notes (Signed)
Patient throwing equipments in her room screaming and saying inappropriate words after being told that she was not going get get IV dilaudid. [place] notified about patient  behavior and and complains. K. Schorr insist she was not going to give patient IV dilaudid. [place] coverage was notified as well.

## 2017-03-01 DIAGNOSIS — G894 Chronic pain syndrome: Secondary | ICD-10-CM

## 2017-03-01 DIAGNOSIS — D473 Essential (hemorrhagic) thrombocythemia: Secondary | ICD-10-CM

## 2017-03-01 DIAGNOSIS — Z7901 Long term (current) use of anticoagulants: Secondary | ICD-10-CM

## 2017-03-01 DIAGNOSIS — D638 Anemia in other chronic diseases classified elsewhere: Secondary | ICD-10-CM

## 2017-03-01 DIAGNOSIS — D5700 Hb-SS disease with crisis, unspecified: Principal | ICD-10-CM

## 2017-03-01 MED ORDER — DIPHENHYDRAMINE HCL 25 MG PO CAPS
25.0000 mg | ORAL_CAPSULE | ORAL | Status: DC | PRN
  Administered 2017-03-02: 50 mg via ORAL
  Filled 2017-03-01: qty 2

## 2017-03-01 MED ORDER — GABAPENTIN 300 MG PO CAPS
300.0000 mg | ORAL_CAPSULE | Freq: Every day | ORAL | Status: AC
  Administered 2017-03-01: 300 mg via ORAL
  Filled 2017-03-01: qty 1

## 2017-03-01 MED ORDER — SODIUM CHLORIDE 0.9% FLUSH
9.0000 mL | INTRAVENOUS | Status: DC | PRN
  Administered 2017-03-02: 70 mL via INTRAVENOUS
  Filled 2017-03-01: qty 9

## 2017-03-01 MED ORDER — SODIUM CHLORIDE 0.9 % IV SOLN
25.0000 mg | INTRAVENOUS | Status: DC | PRN
  Administered 2017-03-01: 25 mg via INTRAVENOUS
  Filled 2017-03-01: qty 0.5

## 2017-03-01 MED ORDER — HYDROMORPHONE 1 MG/ML IV SOLN
INTRAVENOUS | Status: DC
  Filled 2017-03-01: qty 25

## 2017-03-01 MED ORDER — NALOXONE HCL 0.4 MG/ML IJ SOLN: 0.4000 mg | INTRAMUSCULAR | Status: DC | PRN

## 2017-03-01 MED ORDER — ONDANSETRON HCL 4 MG/2ML IJ SOLN
4.0000 mg | Freq: Four times a day (QID) | INTRAMUSCULAR | Status: DC | PRN
Start: 2017-03-01 — End: 2017-03-05

## 2017-03-01 MED ORDER — HYDROMORPHONE HCL 1 MG/ML IJ SOLN: 1.0000 mg | Freq: Once | INTRAMUSCULAR | Status: DC

## 2017-03-01 MED ORDER — GABAPENTIN 300 MG PO CAPS
300.0000 mg | ORAL_CAPSULE | Freq: Three times a day (TID) | ORAL | Status: DC
  Administered 2017-03-03 – 2017-03-05 (×8): 300 mg via ORAL
  Filled 2017-03-01 (×8): qty 1

## 2017-03-01 MED ORDER — OXYCODONE HCL 5 MG PO TABS: 10.0000 mg | ORAL_TABLET | Freq: Once | ORAL | Status: DC

## 2017-03-01 MED ORDER — GABAPENTIN 300 MG PO CAPS
300.0000 mg | ORAL_CAPSULE | Freq: Two times a day (BID) | ORAL | Status: AC
  Administered 2017-03-02 (×2): 300 mg via ORAL
  Filled 2017-03-01 (×2): qty 1

## 2017-03-01 MED ORDER — HYDROMORPHONE 1 MG/ML IV SOLN: INTRAVENOUS | Status: DC

## 2017-03-01 MED ORDER — HYDROMORPHONE 1 MG/ML IV SOLN
INTRAVENOUS | Status: DC
  Administered 2017-03-01: 12.5 mg via INTRAVENOUS
  Administered 2017-03-01: 13.6 mg via INTRAVENOUS
  Administered 2017-03-02: 2.8 mg via INTRAVENOUS
  Administered 2017-03-02: 11.1 mg via INTRAVENOUS
  Administered 2017-03-02: 10.8 mg via INTRAVENOUS
  Administered 2017-03-02: 25 mg via INTRAVENOUS
  Administered 2017-03-02: 9.4 mg via INTRAVENOUS
  Filled 2017-03-01 (×3): qty 25

## 2017-03-01 MED ORDER — HYDROMORPHONE HCL 1 MG/ML IJ SOLN
1.0000 mg | Freq: Once | INTRAMUSCULAR | Status: AC
Start: 2017-03-01 — End: 2017-03-01
  Administered 2017-03-01: 1 mg via INTRAVENOUS
  Filled 2017-03-01: qty 1

## 2017-03-01 MED ORDER — HYDROMORPHONE 1 MG/ML IV SOLN
INTRAVENOUS | Status: DC
  Administered 2017-03-01: 10.5 mg via INTRAVENOUS
  Administered 2017-03-01 (×2): 25 mg via INTRAVENOUS
  Administered 2017-03-01: 11.15 mg via INTRAVENOUS
  Administered 2017-03-01: 8.1 mg via INTRAVENOUS
  Filled 2017-03-01 (×2): qty 25

## 2017-03-01 MED ORDER — HYDROXYUREA 500 MG PO CAPS
1500.0000 mg | ORAL_CAPSULE | Freq: Every day | ORAL | Status: DC
  Administered 2017-03-02 – 2017-03-05 (×4): 1500 mg via ORAL
  Filled 2017-03-01 (×4): qty 3

## 2017-03-01 NOTE — Progress Notes (Signed)
Patient complaining of uncontrollable left leg pain. Patient stated Dilaudid PCA is not controlling her pain and requested for 2 mg dilaudid IV. Floor coverage K. Schorr was notified, K.schorr stated patient having about 3.6 mg every hour and she is not comfortable giving her additional 2 mg of IV dilaudid and she will order oxycodone 10mg . Patient was notified and patient refused to take med stating the oxycodone was a joke to her pain and if she did not get what she asked for she is going to act up. Patient offered heating pad and she refused it. Patient was discouraged about idea to act up and patient was reassured that we are doing everything we can to controlled her pain.

## 2017-03-01 NOTE — Progress Notes (Addendum)
Patient awake crying reporting pain at 10/10. House coverage K.Schorr paged about patient pain. Awaiting respond.
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
## 2017-03-01 NOTE — Progress Notes (Addendum)
Oxycodone 10 mg ordered patient refused requesting IV Push benadryl or 2 mg dilaudid. Raliegh Ip Schorr notified. Awaiting respond.

## 2017-03-01 NOTE — Progress Notes (Signed)
PCA rate changed see order of detailed, dilaudid I mg ordered and given. Benadryl 25 mg IV given per K. Schorr request. Patient awake reported pain at 7/10.will continue to monitor

## 2017-03-02 ENCOUNTER — Inpatient Hospital Stay (HOSPITAL_COMMUNITY): Payer: Medicare Other

## 2017-03-02 ENCOUNTER — Encounter (HOSPITAL_COMMUNITY): Payer: Self-pay

## 2017-03-02 DIAGNOSIS — M545 Low back pain: Secondary | ICD-10-CM

## 2017-03-02 DIAGNOSIS — M79605 Pain in left leg: Secondary | ICD-10-CM

## 2017-03-02 LAB — COMPREHENSIVE METABOLIC PANEL
ALBUMIN: 3.5 g/dL (ref 3.5–5.0)
ALK PHOS: 62 U/L (ref 38–126)
ALT: 35 U/L (ref 14–54)
ANION GAP: 8 (ref 5–15)
AST: 25 U/L (ref 15–41)
BUN: 6 mg/dL (ref 6–20)
CALCIUM: 8.4 mg/dL — AB (ref 8.9–10.3)
CO2: 26 mmol/L (ref 22–32)
Chloride: 105 mmol/L (ref 101–111)
Creatinine, Ser: 0.54 mg/dL (ref 0.44–1.00)
GFR calc non Af Amer: >60 mL/min (ref >60)
Glucose, Bld: 119 mg/dL — ABNORMAL HIGH (ref 65–99)
POTASSIUM: 3.8 mmol/L (ref 3.5–5.1)
SODIUM: 139 mmol/L (ref 135–145)
Total Bilirubin: 1.9 mg/dL — ABNORMAL HIGH (ref 0.3–1.2)
Total Protein: 6.5 g/dL (ref 6.5–8.1)

## 2017-03-02 LAB — CBC WITH DIFFERENTIAL/PLATELET
BASOS ABS: 0 10*3/uL (ref 0.0–0.1)
BASOS PCT: 0 %
EOS ABS: 0.5 10*3/uL (ref 0.0–0.7)
EOS PCT: 3 %
HCT: 20.5 % — ABNORMAL LOW (ref 36.0–46.0)
HEMOGLOBIN: 7.1 g/dL — AB (ref 12.0–15.0)
Lymphocytes Relative: 10 %
Lymphs Abs: 1.6 10*3/uL (ref 0.7–4.0)
MCH: 30.5 pg (ref 26.0–34.0)
MCHC: 34.6 g/dL (ref 30.0–36.0)
MCV: 88 fL (ref 78.0–100.0)
Monocytes Absolute: 1.6 10*3/uL — ABNORMAL HIGH (ref 0.1–1.0)
Monocytes Relative: 11 %
NEUTROS PCT: 76 %
Neutro Abs: 11.8 10*3/uL — ABNORMAL HIGH (ref 1.7–7.7)
PLATELETS: 456 10*3/uL — AB (ref 150–400)
RBC: 2.33 MIL/uL — AB (ref 3.87–5.11)
RDW: 18.6 % — ABNORMAL HIGH (ref 11.5–15.5)
WBC: 15.5 10*3/uL — AB (ref 4.0–10.5)

## 2017-03-02 LAB — RETICULOCYTES
RBC.: 2.33 MIL/uL — AB (ref 3.87–5.11)
RETIC COUNT ABSOLUTE: 410.1 10*3/uL — AB (ref 19.0–186.0)
RETIC CT PCT: 17.6 % — AB (ref 0.4–3.1)

## 2017-03-02 IMAGING — CR DG HUMERUS 2V *L*
2 series · 2 of 2 positions shown · non-contrast
Comparison: Left shoulder dated 05/06/2012 and 12/11/2011 and left
humerus dated 03/11/2009.

CLINICAL DATA: Diffuse left arm pain. No known injury. Sickle cell
disease.

EXAM:
LEFT HUMERUS - 2+ VIEW

[w humerus ap left]
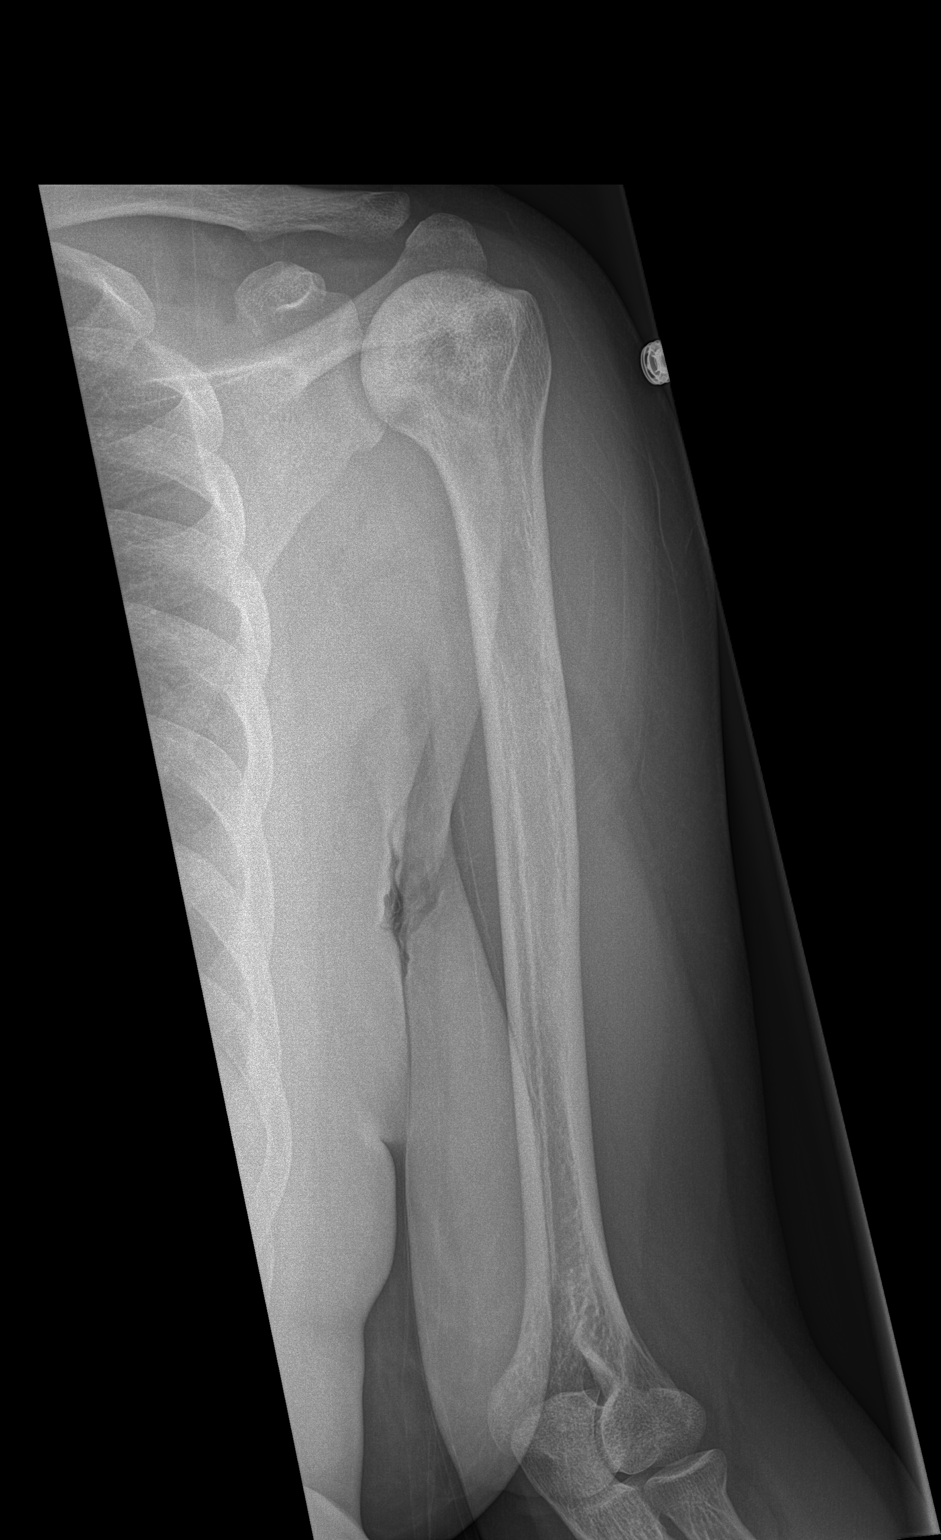

[w humerus lat left]
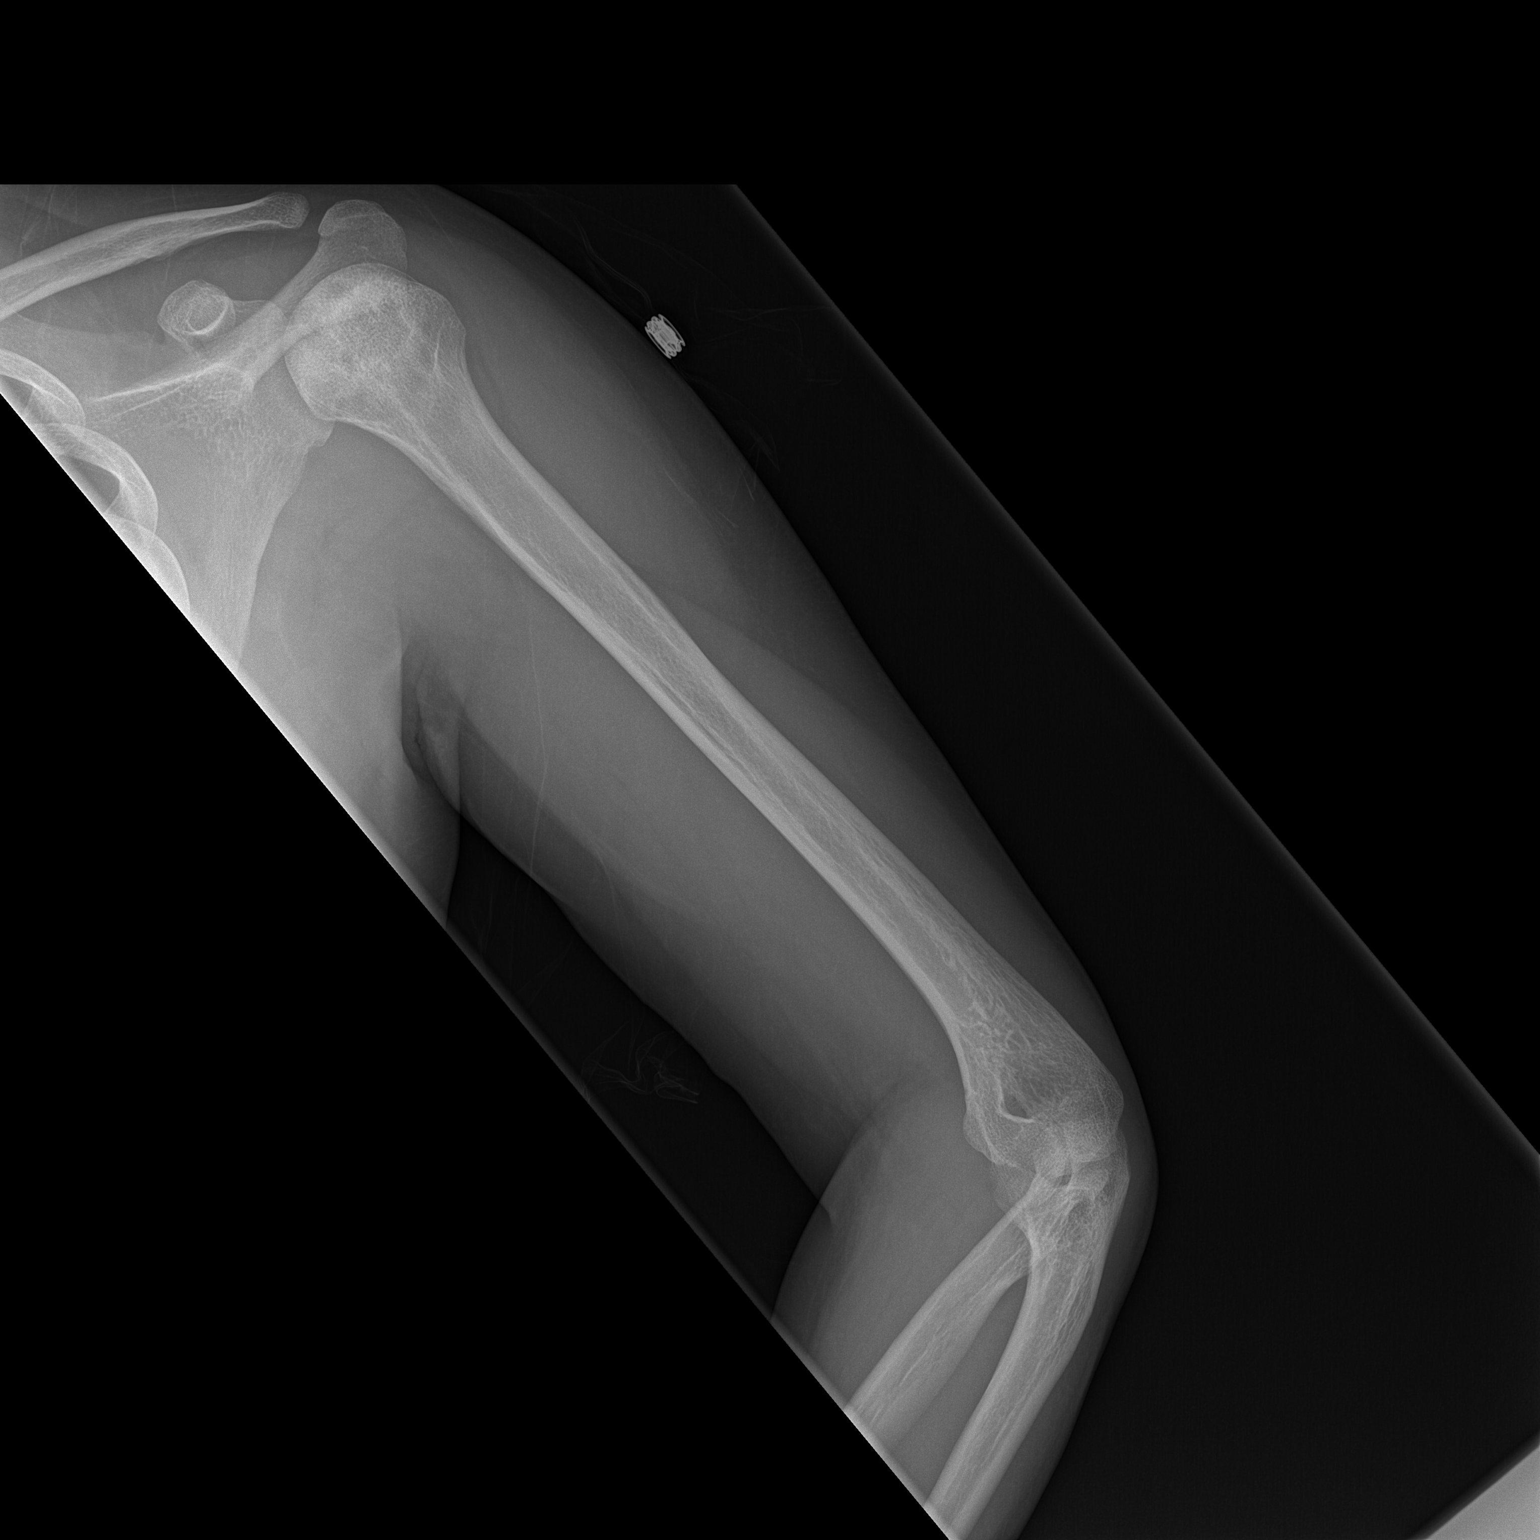

[2 of 2 positions shown; findings below may reference images not displayed]

FINDINGS: Patchy sclerosis is again demonstrated in the humeral head. No other
abnormalities are seen.
IMPRESSION: Stable changes of avascular necrosis in the humeral head. No acute
abnormality.

## 2017-03-02 MED ORDER — DIPHENHYDRAMINE HCL 25 MG PO CAPS
25.0000 mg | ORAL_CAPSULE | ORAL | Status: DC | PRN
  Administered 2017-03-02: 25 mg via ORAL
  Filled 2017-03-02: qty 1

## 2017-03-02 MED ORDER — CAMPHOR-MENTHOL 0.5-0.5 % EX LOTN
TOPICAL_LOTION | CUTANEOUS | Status: DC | PRN
Start: 2017-03-02 — End: 2017-03-05
  Administered 2017-03-03: 17:00:00 via TOPICAL
  Filled 2017-03-02: qty 222

## 2017-03-02 MED ORDER — HYDROMORPHONE 1 MG/ML IV SOLN
INTRAVENOUS | Status: DC
  Administered 2017-03-02: 7.3 mg via INTRAVENOUS
  Administered 2017-03-02: 21:00:00 via INTRAVENOUS
  Administered 2017-03-02: 13.62 mg via INTRAVENOUS
  Administered 2017-03-03: 5.2 mg via INTRAVENOUS
  Administered 2017-03-03: 3.36 mg via INTRAVENOUS
  Administered 2017-03-03: 10.1 mg via INTRAVENOUS
  Filled 2017-03-02 (×3): qty 25

## 2017-03-02 MED ORDER — SODIUM CHLORIDE 0.9% FLUSH: 10.0000 mL | INTRAVENOUS | Status: DC | PRN

## 2017-03-02 MED ORDER — SODIUM CHLORIDE 0.9 % IV SOLN
25.0000 mg | INTRAVENOUS | Status: DC | PRN
  Filled 2017-03-02: qty 0.5

## 2017-03-02 NOTE — Consult Note (Signed)
   [facility] CM Inpatient Consult   03/02/2017  [name] 1991-04-12 [id number]    Patient screened for [place] Management services due to multiple hospital admissions.   Spoke with [name] to confirm Primary Care MD. [name] reports her Primary Care MD is Dr. Ricke Hey who is not a [facility] Provider. Therefore, [name] in not eligible for [facility] Care Management services at this time.   Will notify [place] Management office.     Marthenia Rolling, MSN-Ed, RN,BSN [facility] Liaison [phone]
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
## 2017-03-02 NOTE — Progress Notes (Signed)
Was not able to get blood return from pt port. Iv team called and iv team accessed. Pt refused all interventions by iv team. Lab was called, and notified that phlebotomist is to come to draw blood.

## 2017-03-03 ENCOUNTER — Encounter: Payer: Self-pay | Admitting: Internal Medicine

## 2017-03-03 DIAGNOSIS — K5900 Constipation, unspecified: Secondary | ICD-10-CM

## 2017-03-03 DIAGNOSIS — N939 Abnormal uterine and vaginal bleeding, unspecified: Secondary | ICD-10-CM

## 2017-03-03 LAB — PREGNANCY, URINE: PREG TEST UR: NEGATIVE

## 2017-03-03 MED ORDER — HYDROMORPHONE 1 MG/ML IV SOLN
INTRAVENOUS | Status: DC
  Administered 2017-03-03: 8.31 mg via INTRAVENOUS
  Administered 2017-03-03: 11.5 mg via INTRAVENOUS
  Administered 2017-03-03: 25 mg via INTRAVENOUS
  Administered 2017-03-03: 1 mg via INTRAVENOUS
  Administered 2017-03-03: 7.3 mg via INTRAVENOUS
  Administered 2017-03-03: 25 mg via INTRAVENOUS
  Administered 2017-03-04: 12.9 mg via INTRAVENOUS
  Administered 2017-03-04: 4.2 mg via INTRAVENOUS
  Administered 2017-03-04: 7.3 mg via INTRAVENOUS
  Administered 2017-03-04: 08:00:00 via INTRAVENOUS
  Administered 2017-03-04: 4.2 mg via INTRAVENOUS
  Administered 2017-03-05: 10.5 mg via INTRAVENOUS
  Administered 2017-03-05: 5.3 mg via INTRAVENOUS
  Administered 2017-03-05 (×2): via INTRAVENOUS
  Administered 2017-03-05: 7.7 mg via INTRAVENOUS
  Filled 2017-03-03 (×4): qty 25

## 2017-03-03 MED ORDER — SORBITOL 70 % SOLN
960.0000 mL | TOPICAL_OIL | Freq: Once | ORAL | Status: AC
  Administered 2017-03-03: 960 mL via RECTAL
  Filled 2017-03-03: qty 473

## 2017-03-03 MED ORDER — MEDROXYPROGESTERONE ACETATE 150 MG/ML IM SUSP
150.0000 mg | Freq: Once | INTRAMUSCULAR | Status: AC
  Administered 2017-03-03: 150 mg via INTRAMUSCULAR
  Filled 2017-03-03: qty 1

## 2017-03-03 MED ORDER — MEDROXYPROGESTERONE ACETATE 150 MG/ML IM SUSP
150.0000 mg | Freq: Once | INTRAMUSCULAR | Status: DC
Start: 2017-03-03 — End: 2017-03-03
  Filled 2017-03-03: qty 1

## 2017-03-03 MED ORDER — ASPIRIN-ACETAMINOPHEN-CAFFEINE 250-250-65 MG PO TABS
2.0000 | ORAL_TABLET | Freq: Once | ORAL | Status: AC
  Administered 2017-03-03: 2 via ORAL
  Filled 2017-03-03: qty 2
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
## 2017-03-03 NOTE — Care Management Important Message (Signed)
Important Message  Patient Details  Name: LARUEN RISSER MRN: [id number] Date of Birth: [birth year]   Medicare Important Message Given:  Yes    Kerin Salen 03/03/2017, 12:42 [place] Message  Patient Details  Name: KELILAH HEBARD MRN: [id number] Date of Birth: [birth year]   Medicare Important Message Given:  Yes    Kerin Salen 03/03/2017, 12:41 PM

## 2017-03-04 DIAGNOSIS — R079 Chest pain, unspecified: Secondary | ICD-10-CM

## 2017-03-04 DIAGNOSIS — Z86718 Personal history of other venous thrombosis and embolism: Secondary | ICD-10-CM

## 2017-03-04 LAB — CBC WITH DIFFERENTIAL/PLATELET
BASOS ABS: 0.1 10*3/uL (ref 0.0–0.1)
BASOS PCT: 0 %
EOS ABS: 0.6 10*3/uL (ref 0.0–0.7)
Eosinophils Relative: 4 %
HEMATOCRIT: 18 % — AB (ref 36.0–46.0)
HEMOGLOBIN: 6.3 g/dL — AB (ref 12.0–15.0)
Lymphocytes Relative: 16 %
Lymphs Abs: 2.2 10*3/uL (ref 0.7–4.0)
MCH: 30.1 pg (ref 26.0–34.0)
MCHC: 35 g/dL (ref 30.0–36.0)
MCV: 86.1 fL (ref 78.0–100.0)
Monocytes Absolute: 1.3 10*3/uL — ABNORMAL HIGH (ref 0.1–1.0)
Monocytes Relative: 9 %
NEUTROS ABS: 9.7 10*3/uL — AB (ref 1.7–7.7)
NEUTROS PCT: 70 %
Platelets: 417 10*3/uL — ABNORMAL HIGH (ref 150–400)
RBC: 2.09 MIL/uL — AB (ref 3.87–5.11)
RDW: 18.4 % — ABNORMAL HIGH (ref 11.5–15.5)
WBC: 13.8 10*3/uL — AB (ref 4.0–10.5)

## 2017-03-04 LAB — BASIC METABOLIC PANEL
Anion gap: 8 (ref 5–15)
BUN: 9 mg/dL (ref 6–20)
CALCIUM: 8.4 mg/dL — AB (ref 8.9–10.3)
CO2: 25 mmol/L (ref 22–32)
CREATININE: 0.52 mg/dL (ref 0.44–1.00)
Chloride: 106 mmol/L (ref 101–111)
Glucose, Bld: 106 mg/dL — ABNORMAL HIGH (ref 65–99)
Potassium: 4.2 mmol/L (ref 3.5–5.1)
SODIUM: 139 mmol/L (ref 135–145)

## 2017-03-04 LAB — RETICULOCYTES
RBC.: 2.09 MIL/uL — AB (ref 3.87–5.11)
RETIC COUNT ABSOLUTE: 332.3 10*3/uL — AB (ref 19.0–186.0)
RETIC CT PCT: 15.9 % — AB (ref 0.4–3.1)

## 2017-03-04 LAB — PREPARE RBC (CROSSMATCH)

## 2017-03-04 MED ORDER — SODIUM CHLORIDE 0.9 % IV SOLN
Freq: Once | INTRAVENOUS | Status: AC
  Administered 2017-03-05: via INTRAVENOUS

## 2017-03-04 NOTE — Progress Notes (Signed)
Patient ID: [name], female   DOB: 1991/05/02, 26 y.o.   MRN: [id number] Subjective:  Day 4 on admission for sickle cell pain crisis, patient noticed to have bleeding PV, urine pregnancy test negative, she got a dose of Depo provera yesterday, bleeding has reduced significantly.   No new complaint today, pain is at about 6/10 as against target of 3/10. No fever, denies chest pain. No SOB.  Objective:  Vital signs in last 24 hours:  Vitals:   03/04/17 1113 03/04/17 1205 03/04/17 1533 03/04/17 1614  BP: (!) 111/55  (!) 111/56   Pulse: 94  96   Resp: 14 13 14 15   Temp: 98 F (36.7 C)  99.5 F (37.5 C)   TempSrc: Oral  Oral   SpO2: 98% 96% 98% 94%  Weight:      Height:        Intake/Output from previous day:   Intake/Output Summary (Last 24 hours) at 03/04/17 1712 Last data filed at 03/04/17 1200  Gross per 24 hour  Intake          5633.75 ml  Output                0 ml  Net          5633.75 ml    Physical Exam: General: Alert, awake, oriented x3, in no acute distress.  HEENT: [place]/AT PEERL, EOMI Neck: Trachea midline,  no masses, no thyromegal,y no JVD, no carotid bruit OROPHARYNX:  Moist, No exudate/ erythema/lesions.  Heart: Regular rate and rhythm, without murmurs, rubs, gallops, PMI non-displaced, no heaves or thrills on palpation.  Lungs: Clear to auscultation, no wheezing or rhonchi noted. No increased vocal fremitus resonant to percussion  Abdomen: Soft, nontender, nondistended, positive bowel sounds, no masses no hepatosplenomegaly noted..  Neuro: No focal neurological deficits noted cranial nerves II through XII grossly intact. DTRs 2+ bilaterally upper and lower extremities. Strength 5 out of 5 in bilateral upper and lower extremities. Musculoskeletal: No warm swelling or erythema around joints, no spinal tenderness noted. Psychiatric: Patient alert and oriented x3, good insight and cognition, good recent to remote recall. Lymph node survey: No cervical  axillary or inguinal lymphadenopathy noted.  Lab Results:  Basic Metabolic Panel:    Component Value Date/Time   NA 139 03/04/2017 1247   NA 139 05/06/2012 1645   K 4.2 03/04/2017 1247   K 3.9 05/06/2012 1645   CL 106 03/04/2017 1247   CL 110 (H) 05/06/2012 1645   CO2 25 03/04/2017 1247   CO2 22 05/06/2012 1645   BUN 9 03/04/2017 1247   BUN 9 05/06/2012 1645   CREATININE 0.52 03/04/2017 1247   CREATININE 0.43 (L) 07/06/2015 1356   GLUCOSE 106 (H) 03/04/2017 1247   GLUCOSE 84 05/06/2012 1645   CALCIUM 8.4 (L) 03/04/2017 1247   CALCIUM 8.3 (L) 05/06/2012 1645   CBC:    Component Value Date/Time   WBC 13.8 (H) 03/04/2017 1247   HGB 6.3 (LL) 03/04/2017 1247   HGB 8.7 (L) 05/11/2012 0721   HCT 18.0 (L) 03/04/2017 1247   HCT 26.9 (L) 05/11/2012 0721   PLT 417 (H) 03/04/2017 1247   PLT 497 (H) 05/11/2012 0721   MCV 86.1 03/04/2017 1247   MCV 94 05/11/2012 0721   NEUTROABS 9.7 (H) 03/04/2017 1247   NEUTROABS 10.9 (H) 05/10/2012 0552   LYMPHSABS 2.2 03/04/2017 1247   LYMPHSABS 3.5 05/10/2012 0552   MONOABS 1.3 (H) 03/04/2017 1247   MONOABS 1.7 (H)  05/10/2012 0552   EOSABS 0.6 03/04/2017 1247   EOSABS 0.8 (H) 05/10/2012 0552   BASOSABS 0.1 03/04/2017 1247   BASOSABS 0.1 05/10/2012 0552    No results found for this or any previous visit (from the past 240 hour(s)).  Studies/Results: Dg Lumbar Spine 2-3 Views  Result Date: 03/02/2017 CLINICAL DATA:  Severe low back pain and left leg pain EXAM: LUMBAR SPINE - 2-3 VIEW COMPARISON:  06/10/2016 FINDINGS: There is no evidence of lumbar spine fracture. Alignment is normal. H-shaped vertebral bodies on the lateral view which can be seen in sickle-cell. No pars defects. No spondylolisthesis. An abundant amount fecal retention is seen within the adjacent colon. IMPRESSION: 1. H-shaped vertebral bodies consistent with sickle-cell. 2. No acute osseous appearing abnormality. 3. Increased fecal retention within the colon consistent with  constipation. Electronically Signed   By: Ashley Royalty M.D.   On: 03/02/2017 19:28    Medications: Scheduled Meds: . aspirin  81 mg Oral Daily  . gabapentin  300 mg Oral TID  . HYDROmorphone   Intravenous Q4H  . hydroxyurea  1,500 mg Oral Daily  . ketorolac  30 mg Intravenous Q6H  . morphine  60 mg Oral Q12H  . rivaroxaban  20 mg Oral Q supper  . senna-docusate  1 tablet Oral BID  . valACYclovir  1,000 mg Oral Daily   Continuous Infusions: . sodium chloride 75 mL/hr at 02/28/17 2140  . 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 Hb SS with Crisis: Continue Dilaudid PCA at 0.6 mg with 10 minute lockout and decrease PRN bolus doses to every 4 hours. Continue Toradol at 30 mg every 6 hours and Tylenol 1000 mg every 6 hours. 2. Acute Blood Loss Anemia in a setting of Anemia of Chronic Disease: Urine pregnancy test was negative, she got Depo-Provera yesterday, however, Hb dropped to below baseline, will transfuse with one unit on PRBC today. Re-check Hb tomorrow. Continue Hydrea and start on Folic Acid.  3. Chronic Pain Syndrome: Continue MS Contin 60 mg every 12 hours. Continue Neurontin for management of Neuropathic pain.  4. Chronic Anticoagulation: Pt previously had a blood clot to the LUE not associated with PICC line. Continue Xarelto. 5. Thrombocytosis: Reactive but improved. Continue ASA.  6. Elevated D-Dimer: CTA negative for PE. However since Sickle Cell Crisis is  a pro-coagulant state and the D-dimer is likely to always be elevated when patient is in crisis.  7. Constipation: Pt has not had a BM since admission. Will give enema per patients choice as she prefers this over laxative.   Code Status: Full Code Family Communication: N/A Disposition Plan: Not yet ready for discharge  Cristiano Capri  If 7PM-7AM, please contact night-coverage.  03/04/2017, 5:12 PM  LOS: 4 days

## 2017-03-05 ENCOUNTER — Encounter (HOSPITAL_COMMUNITY): Payer: Self-pay | Admitting: *Deleted

## 2017-03-05 DIAGNOSIS — R1032 Left lower quadrant pain: Secondary | ICD-10-CM

## 2017-03-05 DIAGNOSIS — R208 Other disturbances of skin sensation: Secondary | ICD-10-CM

## 2017-03-05 DIAGNOSIS — R52 Pain, unspecified: Secondary | ICD-10-CM

## 2017-03-05 DIAGNOSIS — R0602 Shortness of breath: Secondary | ICD-10-CM

## 2017-03-05 LAB — CBC WITH DIFFERENTIAL/PLATELET
BASOS ABS: 0.1 10*3/uL (ref 0.0–0.1)
BASOS PCT: 0 %
EOS ABS: 0.6 10*3/uL (ref 0.0–0.7)
Eosinophils Relative: 4 %
HEMATOCRIT: 22.4 % — AB (ref 36.0–46.0)
HEMOGLOBIN: 7.8 g/dL — AB (ref 12.0–15.0)
Lymphocytes Relative: 17 %
Lymphs Abs: 2.7 10*3/uL (ref 0.7–4.0)
MCH: 29.7 pg (ref 26.0–34.0)
MCHC: 34.8 g/dL (ref 30.0–36.0)
MCV: 85.2 fL (ref 78.0–100.0)
MONOS PCT: 9 %
Monocytes Absolute: 1.5 10*3/uL — ABNORMAL HIGH (ref 0.1–1.0)
NEUTROS ABS: 11.4 10*3/uL — AB (ref 1.7–7.7)
NEUTROS PCT: 70 %
Platelets: 512 10*3/uL — ABNORMAL HIGH (ref 150–400)
RBC: 2.63 MIL/uL — AB (ref 3.87–5.11)
RDW: 20 % — ABNORMAL HIGH (ref 11.5–15.5)
WBC: 16.3 10*3/uL — AB (ref 4.0–10.5)

## 2017-03-05 LAB — TYPE AND SCREEN
ABO/RH(D): B POS
ANTIBODY SCREEN: NEGATIVE
UNIT DIVISION: 0
Unit division: 0

## 2017-03-05 LAB — BPAM RBC
BLOOD PRODUCT EXPIRATION DATE: 201811142359
Blood Product Expiration Date: 201811162359
ISSUE DATE / TIME: 201810202104
UNIT TYPE AND RH: 5100
UNIT TYPE AND RH: 5100

## 2017-03-05 MED ORDER — HEPARIN SOD (PORK) LOCK FLUSH 100 UNIT/ML IV SOLN
500.0000 [IU] | INTRAVENOUS | Status: AC | PRN
  Administered 2017-03-05: 500 [IU]

## 2017-03-05 NOTE — Progress Notes (Signed)
Discharge instructions reviewed with patient utilizing teach back method. Patient discharged to home  
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
## 2017-03-20 DIAGNOSIS — Z79891 Long term (current) use of opiate analgesic: Secondary | ICD-10-CM | POA: Diagnosis not present

## 2017-03-31 ENCOUNTER — Ambulatory Visit: Payer: Self-pay | Admitting: Family Medicine

## 2017-04-01 ENCOUNTER — Other Ambulatory Visit: Payer: Self-pay

## 2017-04-01 ENCOUNTER — Encounter (HOSPITAL_COMMUNITY): Payer: Self-pay | Admitting: Emergency Medicine

## 2017-04-01 ENCOUNTER — Inpatient Hospital Stay (HOSPITAL_COMMUNITY)
Admission: EM | Admit: 2017-04-01 | Discharge: 2017-04-03 | DRG: 812 | Disposition: A | Discharge status: Home / Self Care | Payer: Medicare Other | Attending: Internal Medicine | Admitting: Internal Medicine

## 2017-04-01 DIAGNOSIS — G8929 Other chronic pain: Secondary | ICD-10-CM

## 2017-04-01 DIAGNOSIS — Z91040 Latex allergy status: Secondary | ICD-10-CM

## 2017-04-01 DIAGNOSIS — Z86718 Personal history of other venous thrombosis and embolism: Secondary | ICD-10-CM

## 2017-04-01 DIAGNOSIS — Z91018 Allergy to other foods: Secondary | ICD-10-CM | POA: Diagnosis not present

## 2017-04-01 DIAGNOSIS — G894 Chronic pain syndrome: Secondary | ICD-10-CM | POA: Diagnosis not present

## 2017-04-01 DIAGNOSIS — R7989 Other specified abnormal findings of blood chemistry: Secondary | ICD-10-CM | POA: Diagnosis present

## 2017-04-01 DIAGNOSIS — D72829 Elevated white blood cell count, unspecified: Secondary | ICD-10-CM | POA: Diagnosis present

## 2017-04-01 DIAGNOSIS — Z79899 Other long term (current) drug therapy: Secondary | ICD-10-CM | POA: Diagnosis not present

## 2017-04-01 DIAGNOSIS — Z79891 Long term (current) use of opiate analgesic: Secondary | ICD-10-CM

## 2017-04-01 DIAGNOSIS — Z7901 Long term (current) use of anticoagulants: Secondary | ICD-10-CM | POA: Diagnosis not present

## 2017-04-01 DIAGNOSIS — D5700 Hb-SS disease with crisis, unspecified: Secondary | ICD-10-CM | POA: Diagnosis present

## 2017-04-01 DIAGNOSIS — F329 Major depressive disorder, single episode, unspecified: Secondary | ICD-10-CM | POA: Diagnosis present

## 2017-04-01 DIAGNOSIS — K219 Gastro-esophageal reflux disease without esophagitis: Secondary | ICD-10-CM | POA: Diagnosis present

## 2017-04-01 DIAGNOSIS — D473 Essential (hemorrhagic) thrombocythemia: Secondary | ICD-10-CM | POA: Diagnosis present

## 2017-04-01 DIAGNOSIS — D75839 Thrombocytosis, unspecified: Secondary | ICD-10-CM | POA: Diagnosis present

## 2017-04-01 DIAGNOSIS — Z87891 Personal history of nicotine dependence: Secondary | ICD-10-CM

## 2017-04-01 DIAGNOSIS — D638 Anemia in other chronic diseases classified elsewhere: Secondary | ICD-10-CM | POA: Diagnosis present

## 2017-04-01 LAB — COMPREHENSIVE METABOLIC PANEL
ALK PHOS: 66 U/L (ref 38–126)
ALT: 15 U/L (ref 14–54)
AST: 17 U/L (ref 15–41)
Albumin: 4.4 g/dL (ref 3.5–5.0)
Anion gap: 7 (ref 5–15)
BUN: 6 mg/dL (ref 6–20)
CALCIUM: 8.9 mg/dL (ref 8.9–10.3)
CHLORIDE: 106 mmol/L (ref 101–111)
CO2: 22 mmol/L (ref 22–32)
CREATININE: 0.76 mg/dL (ref 0.44–1.00)
GFR calc non Af Amer: >60 mL/min (ref >60)
GLUCOSE: 124 mg/dL — AB (ref 65–99)
Potassium: 3.6 mmol/L (ref 3.5–5.1)
SODIUM: 135 mmol/L (ref 135–145)
Total Bilirubin: 2 mg/dL — ABNORMAL HIGH (ref 0.3–1.2)
Total Protein: 7.9 g/dL (ref 6.5–8.1)

## 2017-04-01 LAB — CBC WITH DIFFERENTIAL/PLATELET
BASOS PCT: 1 %
Basophils Absolute: 0.1 10*3/uL (ref 0.0–0.1)
EOS ABS: 0.2 10*3/uL (ref 0.0–0.7)
EOS PCT: 1 %
HCT: 27 % — ABNORMAL LOW (ref 36.0–46.0)
HEMOGLOBIN: 9.2 g/dL — AB (ref 12.0–15.0)
LYMPHS ABS: 2.6 10*3/uL (ref 0.7–4.0)
Lymphocytes Relative: 17 %
MCH: 28.2 pg (ref 26.0–34.0)
MCHC: 34.1 g/dL (ref 30.0–36.0)
MCV: 82.8 fL (ref 78.0–100.0)
MONO ABS: 0.8 10*3/uL (ref 0.1–1.0)
MONOS PCT: 5 %
NEUTROS PCT: 76 %
Neutro Abs: 11.5 10*3/uL — ABNORMAL HIGH (ref 1.7–7.7)
PLATELETS: 514 10*3/uL — AB (ref 150–400)
RBC: 3.26 MIL/uL — ABNORMAL LOW (ref 3.87–5.11)
RDW: 18.8 % — AB (ref 11.5–15.5)
WBC: 15.1 10*3/uL — ABNORMAL HIGH (ref 4.0–10.5)

## 2017-04-01 LAB — RETICULOCYTES
RBC.: 3.26 MIL/uL — AB (ref 3.87–5.11)
RETIC CT PCT: 11.8 % — AB (ref 0.4–3.1)
Retic Count, Absolute: 384.7 10*3/uL — ABNORMAL HIGH (ref 19.0–186.0)

## 2017-04-01 LAB — PREGNANCY, URINE: Preg Test, Ur: NEGATIVE

## 2017-04-01 MED ORDER — ACETAMINOPHEN 500 MG PO TABS: 1000.0000 mg | ORAL_TABLET | Freq: Four times a day (QID) | ORAL | Status: DC | PRN

## 2017-04-01 MED ORDER — POLYETHYLENE GLYCOL 3350 17 G PO PACK: 17.0000 g | PACK | Freq: Every day | ORAL | Status: DC | PRN

## 2017-04-01 MED ORDER — OXYCODONE HCL 5 MG PO TABS: 20.0000 mg | ORAL_TABLET | ORAL | Status: DC | PRN

## 2017-04-01 MED ORDER — NALOXONE HCL 0.4 MG/ML IJ SOLN: 0.4000 mg | INTRAMUSCULAR | Status: DC | PRN

## 2017-04-01 MED ORDER — DIPHENHYDRAMINE HCL 50 MG/ML IJ SOLN
25.0000 mg | Freq: Once | INTRAMUSCULAR | Status: AC
  Administered 2017-04-01: 25 mg via INTRAVENOUS
  Filled 2017-04-01: qty 1

## 2017-04-01 MED ORDER — OXYCODONE HCL 5 MG PO TABS
20.0000 mg | ORAL_TABLET | ORAL | Status: DC | PRN
  Administered 2017-04-02: 20 mg via ORAL
  Filled 2017-04-01 (×2): qty 4

## 2017-04-01 MED ORDER — DIPHENHYDRAMINE HCL 50 MG/ML IJ SOLN: 25.0000 mg | INTRAMUSCULAR | Status: DC | PRN

## 2017-04-01 MED ORDER — SODIUM CHLORIDE 0.9% FLUSH: 9.0000 mL | INTRAVENOUS | Status: DC | PRN

## 2017-04-01 MED ORDER — HYDROMORPHONE HCL 2 MG/ML IJ SOLN: 2.0000 mg | INTRAMUSCULAR | Status: AC

## 2017-04-01 MED ORDER — HYDROMORPHONE 1 MG/ML IV SOLN
INTRAVENOUS | Status: DC
  Administered 2017-04-01: 20:00:00 via INTRAVENOUS
  Administered 2017-04-02: 15 mg via INTRAVENOUS
  Administered 2017-04-02: 05:00:00 via INTRAVENOUS
  Administered 2017-04-02: 8 mg via INTRAVENOUS
  Filled 2017-04-01 (×2): qty 25

## 2017-04-01 MED ORDER — SODIUM CHLORIDE 0.45 % IV SOLN
INTRAVENOUS | Status: DC
  Administered 2017-04-01 – 2017-04-03 (×4): via INTRAVENOUS

## 2017-04-01 MED ORDER — ADULT MULTIVITAMIN W/MINERALS CH
1.0000 | ORAL_TABLET | Freq: Every day | ORAL | Status: DC
  Administered 2017-04-01 – 2017-04-03 (×3): 1 via ORAL
  Filled 2017-04-01 (×3): qty 1

## 2017-04-01 MED ORDER — DIPHENHYDRAMINE HCL 25 MG PO CAPS: 25.0000 mg | ORAL_CAPSULE | ORAL | Status: DC | PRN

## 2017-04-01 MED ORDER — RIVAROXABAN 20 MG PO TABS
20.0000 mg | ORAL_TABLET | Freq: Every day | ORAL | Status: DC
  Administered 2017-04-01 – 2017-04-02 (×2): 20 mg via ORAL
  Filled 2017-04-01 (×2): qty 1

## 2017-04-01 MED ORDER — HYDROMORPHONE HCL 2 MG/ML IJ SOLN
2.0000 mg | INTRAMUSCULAR | Status: AC
  Administered 2017-04-01: 2 mg via INTRAVENOUS
  Filled 2017-04-01: qty 1

## 2017-04-01 MED ORDER — HYDROMORPHONE HCL 2 MG/ML IJ SOLN: 2.0000 mg | Freq: Once | INTRAMUSCULAR | Status: DC

## 2017-04-01 MED ORDER — HEPARIN SOD (PORK) LOCK FLUSH 100 UNIT/ML IV SOLN: 500.0000 [IU] | Freq: Once | INTRAVENOUS | Status: DC

## 2017-04-01 MED ORDER — HYDROMORPHONE HCL 2 MG/ML IJ SOLN: 2.0000 mg | INTRAMUSCULAR | Status: DC

## 2017-04-01 MED ORDER — SENNOSIDES-DOCUSATE SODIUM 8.6-50 MG PO TABS
1.0000 | ORAL_TABLET | Freq: Two times a day (BID) | ORAL | Status: DC
  Administered 2017-04-01 – 2017-04-03 (×4): 1 via ORAL
  Filled 2017-04-01 (×4): qty 1

## 2017-04-01 MED ORDER — ONDANSETRON HCL 4 MG/2ML IJ SOLN: 4.0000 mg | INTRAMUSCULAR | Status: DC | PRN

## 2017-04-01 MED ORDER — DIPHENHYDRAMINE HCL 50 MG/ML IJ SOLN
12.5000 mg | Freq: Once | INTRAMUSCULAR | Status: AC
  Administered 2017-04-01: 12.5 mg via INTRAVENOUS
  Filled 2017-04-01: qty 1

## 2017-04-01 MED ORDER — HYDROXYUREA 500 MG PO CAPS
500.0000 mg | ORAL_CAPSULE | Freq: Two times a day (BID) | ORAL | Status: DC
  Administered 2017-04-01 – 2017-04-03 (×4): 500 mg via ORAL
  Filled 2017-04-01 (×4): qty 1

## 2017-04-01 MED ORDER — HYDROMORPHONE HCL 2 MG/ML IJ SOLN
2.0000 mg | INTRAMUSCULAR | Status: DC
Start: 2017-04-01 — End: 2017-04-01
  Administered 2017-04-01: 2 mg via INTRAVENOUS
  Filled 2017-04-01: qty 1

## 2017-04-01 MED ORDER — KETOROLAC TROMETHAMINE 30 MG/ML IJ SOLN
30.0000 mg | Freq: Four times a day (QID) | INTRAMUSCULAR | Status: DC
  Administered 2017-04-01 – 2017-04-03 (×8): 30 mg via INTRAVENOUS
  Filled 2017-04-01 (×8): qty 1

## 2017-04-01 MED ORDER — SODIUM CHLORIDE 0.9 % IV SOLN
25.0000 mg | INTRAVENOUS | Status: DC | PRN
  Filled 2017-04-01: qty 0.5

## 2017-04-01 NOTE — ED Notes (Signed)
Hospitalist at bedside 

## 2017-04-01 NOTE — ED Notes (Signed)
PA at bedside.

## 2017-04-01 NOTE — ED Notes (Signed)
Patient requesting additional dose of pain medication. PA made aware.

## 2017-04-01 NOTE — ED Notes (Signed)
ED Provider at bedside. 

## 2017-04-01 NOTE — ED Notes (Signed)
Patient reports she has a ride.

## 2017-04-01 NOTE — H&P (Signed)
History and Physical    [name] KGU:[id number] DOB: April 24, 1991 DOA: 04/01/2017  PCP: Ricke Hey, MD   Patient coming from: Home  I have personally briefly reviewed patient's old medical records in [place]  Chief Complaint: Lower back pain  HPI: [name] is a 26 y.o. 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  Patient states that she has been having worsening lower back pain over the last 1-2-day duration, constant, sharp in nature, up to 10 out of 10 in intensity aggravated by movement and touch and not relieved much by her home oral pain medications.  The patient denies any associated fever, chills, headache, lightheadedness, dizziness, chest pain, shortness of breath, cough, abdominal pain, dysuria.  ED Course: Patient received few doses of intravenous Dilaudid in the ED and is still complaining of 7 out of 10 back pain.  Hospitalist service was called to evaluate the patient.  Review of Systems: As per HPI otherwise 10 point review of systems negative.    Past Medical History:  Diagnosis Date  . Blood transfusion    "lots"  . Blood transfusion without reported diagnosis   . Chronic back pain    "very severe; have knot in my back; from tight muscle; take RX and exercise for it"  . Depression 01/06/2011  . Exertional dyspnea    "sometimes"  . Genital HSV   . GERD (gastroesophageal reflux disease) 02/17/2011  . Migraines 11/08/11   "@ least twice/month"  . 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  . Mood swings 11/08/11   "I go back and forth; real bad"  . Sickle cell anemia ([name])   . Sickle cell anemia with crisis ([place])   . Thrombocytosis ([place]) 11/09/2015  . Trichotillomania    h/o    Past Surgical History:  Procedure Laterality Date  . CHOLECYSTECTOMY   05/2010  . DILATION AND CURETTAGE OF UTERUS  02/20/11   S/P miscarriage  . IR GENERIC HISTORICAL  12/23/2015   IR FLUORO GUIDE CV LINE RIGHT 12/23/2015 Jacqulynn Cadet, MD [facility]  . IR GENERIC HISTORICAL  12/23/2015   IR US GUIDE VASC ACCESS RIGHT 12/23/2015 Jacqulynn Cadet, MD [facility]   Social history  reports that she quit smoking about 4 years ago. Her smoking use included cigarettes. She has a 0.25 pack-year smoking history. she has never used smokeless tobacco. She reports that she does not drink alcohol or use drugs.  Lives at home with family  Allergies  Allergen Reactions  . Food Hives and Other (See Comments)    Pt is allergic to carrots.    . Latex Rash    Family History  Problem Relation Age of Onset  . Sickle cell trait Mother   . Sickle cell trait Father   . Diabetes Maternal Grandmother   . Diabetes Paternal Grandmother   . Hypertension Paternal Grandmother   . Diabetes Maternal Grandfather     Prior to Admission medications   Medication Sig Start Date End Date Taking? Authorizing Provider  acetaminophen (TYLENOL) 500 MG tablet Take 1,000 mg every 6 (six) hours as needed by mouth for moderate pain.   Yes [provider]  aspirin-acetaminophen-caffeine (EXCEDRIN MIGRAINE) [phone] MG tablet Take 2 tablets every 6 (six) hours as needed by mouth for headache.   Yes [provider]  Cyanocobalamin-Caffeine (ENERGY CHEWS  PO) Take 2 tablets daily by mouth.   Yes [provider]  hydroxyurea (HYDREA) 500 MG capsule Take 2 capsules (1,000 mg total) by mouth daily. May take with food to minimize GI side effects. Patient taking differently: Take 500 mg 2 (two) times daily by mouth. May take with food to minimize GI side effects. 04/18/16  Yes Tresa Garter, MD  medroxyPROGESTERone (DEPO-PROVERA) 150 MG/ML injection Inject 1 mL (150 mg total) into the muscle every 3 (three) months. 09/21/16  Yes Shelly Bombard, MD  morphine (MS CONTIN)  60 MG 12 hr tablet Take 60 mg by mouth every 12 (twelve) hours as needed for pain.    Yes [provider]  Multiple Vitamin (MULTIVITAMIN WITH MINERALS) TABS tablet Take 1 tablet daily by mouth.   Yes [provider]  Multiple Vitamins-Minerals (HAIR SKIN AND NAILS FORMULA PO) Take 2 tablets daily by mouth.   Yes [provider]  Oxycodone HCl 20 MG TABS Take 20 mg every 4 (four) hours as needed by mouth (pain).  01/17/17  Yes [provider]  rivaroxaban (XARELTO) 20 MG TABS tablet Take 20 mg by mouth daily with supper.    Yes [provider]    Physical Exam: Vitals:   04/01/17 1437 04/01/17 1530 04/01/17 1600 04/01/17 1700  BP: 102/80 113/69 105/73 129/77  Pulse: 83 (!) 109 100 93  Resp: 17 14 12 14   Temp:      TempSrc:      SpO2: 98% 97% 99% 99%    Constitutional: Young female lying in bed in moderate distress secondary to pain intermittently crying Vitals:   04/01/17 1437 04/01/17 1530 04/01/17 1600 04/01/17 1700  BP: 102/80 113/69 105/73 129/77  Pulse: 83 (!) 109 100 93  Resp: 17 14 12 14   Temp:      TempSrc:      SpO2: 98% 97% 99% 99%   Eyes: PERRL, lids and conjunctivae normal ENMT: Mucous membranes are moist. Posterior pharynx clear of any exudate or lesions.  Neck: normal, supple, no masses, no thyromegaly Respiratory: Bilateral decreased breath sounds at bases with no wheezing or crackles.  No tachypnea Cardiovascular: S1-S2 positive, rate controlled.  No murmurs  abdomen: no tenderness, no masses palpated. No hepatosplenomegaly. Bowel sounds positive.  Musculoskeletal: no clubbing / cyanosis.  Tenderness over the lower back and paraspinal region  skin: no rashes, lesions, ulcers. No induration Neurologic: CN 2-12 grossly intact.  Moving extremities. Psychiatric: Normal judgment and insight. Alert and oriented x 3. Normal mood.    Labs on Admission: I have personally reviewed following labs and imaging  studies  CBC: Recent Labs  Lab 04/01/17 1243  WBC 15.1*  NEUTROABS 11.5*  HGB 9.2*  HCT 27.0*  MCV 82.8  PLT 767*   Basic Metabolic Panel: Recent Labs  Lab 04/01/17 1243  NA 135  K 3.6  CL 106  CO2 22  GLUCOSE 124*  BUN 6  CREATININE 0.76  CALCIUM 8.9   GFR: CrCl cannot be calculated (Unknown ideal weight.). Liver Function Tests: Recent Labs  Lab 04/01/17 1243  AST 17  ALT 15  ALKPHOS 66  BILITOT 2.0*  PROT 7.9  ALBUMIN 4.4   No results for input(s): LIPASE, AMYLASE in the last 168 hours. No results for input(s): AMMONIA in the last 168 hours. Coagulation Profile: No results for input(s): INR, PROTIME in the last 168 hours. Cardiac Enzymes: No results for input(s): CKTOTAL, CKMB, CKMBINDEX, TROPONINI in the last 168 hours. BNP (  last 3 results) No results for input(s): PROBNP in the last 8760 hours. HbA1C: No results for input(s): HGBA1C in the last 72 hours. CBG: No results for input(s): GLUCAP in the last 168 hours. Lipid Profile: No results for input(s): CHOL, HDL, LDLCALC, TRIG, CHOLHDL, LDLDIRECT in the last 72 hours. Thyroid Function Tests: No results for input(s): TSH, T4TOTAL, FREET4, T3FREE, THYROIDAB in the last 72 hours. Anemia Panel: Recent Labs    04/01/17 1243  RETICCTPCT 11.8*   Urine analysis:    Component Value Date/Time   COLORURINE YELLOW 02/02/2017 0027   APPEARANCEUR CLEAR 02/02/2017 0027   APPEARANCEUR Clear 05/06/2012 1546   LABSPEC 1.011 02/02/2017 0027   LABSPEC 1.011 05/06/2012 1546   PHURINE 6.0 02/02/2017 0027   GLUCOSEU NEGATIVE 02/02/2017 0027   GLUCOSEU Negative 05/06/2012 1546   HGBUR NEGATIVE 02/02/2017 0027   HGBUR negative 01/08/2009 1329   BILIRUBINUR NEGATIVE 02/02/2017 0027   BILIRUBINUR Negative 05/06/2012 1546   KETONESUR NEGATIVE 02/02/2017 0027   PROTEINUR NEGATIVE 02/02/2017 0027   UROBILINOGEN 2.0 (H) 10/26/2015 0817   NITRITE NEGATIVE 02/02/2017 0027   LEUKOCYTESUR NEGATIVE 02/02/2017 0027    LEUKOCYTESUR Negative 05/06/2012 1546    Radiological Exams on Admission: No results found.    Assessment/Plan Principal Problem:   Sickle cell crisis ([name]) Active Problems:   Chronic pain   History of DVT (deep vein thrombosis)  Sickle cell pain crisis in a patient with history of sickle cell SS disease -Patient is in significant pain despite few doses of IV Dilaudid. 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 -Patient states that she has been having menstruation continuously for the last month.  Outpatient follow-up with gynecology -Repeat a.m. Hemoglobin  Thrombocytosis -Probably reactive.  Monitor  Leukocytosis -Probably reactive.  Repeat a.m. labs   DVT prophylaxis: Xarelto Code Status: Full Family Communication: None at bedside Disposition Plan: Home in 1-3 days Consults called: None Admission status: Inpatient in telemetry  Severity of Illness: The appropriate patient status for this patient is INPATIENT. Inpatient status is judged to be reasonable and necessary in order to provide the required intensity of service to ensure the patient's safety. The patient's presenting symptoms, physical exam findings, and initial radiographic and laboratory data in the context of their chronic comorbidities is felt to place them at high risk for further clinical deterioration. Furthermore, it is not anticipated that the patient will be medically stable for discharge from the hospital within 2 midnights of admission. The following factors support the patient status of inpatient.   " The patient's presenting symptoms include severe pain. " The worrisome physical exam findings include lower back tenderness. " The initial radiographic and  laboratory data are worrisome because of leukocytosis and anemia. " The chronic co-morbidities include chronic pain and sickle cell SS disease.   * I certify that at the point of admission it is my clinical judgment that the patient will require inpatient hospital care spanning beyond 2 midnights from the point of admission due to high intensity of service, high risk for further deterioration and high frequency of surveillance required.Aline August MD [facility] Pager [phone]  If 7PM-7AM, please contact night-coverage [email or web address] Password [facility]  04/01/2017, 5:33 PM

## 2017-04-01 NOTE — ED Triage Notes (Signed)
Patient c/o sickle cell pain crisis pain in her lower back onset of yesterday. Pt reports she has been on her period for 30 days. Pt believes this is contributing to her crisis.

## 2017-04-01 NOTE — ED Notes (Signed)
Patient made aware urine sample is needed. 

## 2017-04-01 NOTE — ED Provider Notes (Signed)
[facility] DEPT Provider Note   CSN: [id number] Arrival date & time: 04/01/17  1233     History   Chief Complaint Chief Complaint  Patient presents with  . Sickle Cell Pain Crisis    HPI [name] is a 26 y.o. female.  HPI   26 year old female with significant history of sickle cell type SS disease, chronic pain, depression presenting with complaints of sickle cell related pain.  Patient states for day and a half she has been complaining of pain to her low back.  She described pain as a achy sensation, moderate to severe, nonradiating and felt similar to prior sickle cell related pain.  She attributed the pain to a persistent prolonged menstrual period that has lasted for a month which is unusual for her.  She has been taking her home pain medication without adequate relief.  She recently ran out of her morphine but states she is going to follow-up with her PCP in the next few days for refill.  She denies any associated fever, chills, headache, lightheadedness, dizziness, chest pain, shortness of breath, abdominal pain, dysuria, hematuria, vaginal discharge.  She recently received a Depo-Provera shot and think it may contribute to her symptoms.  She felt if her pain is well controlled she would like to go home instead of being admitted.  Past Medical History:  Diagnosis Date  . Blood transfusion    "lots"  . Blood transfusion without reported diagnosis   . Chronic back pain    "very severe; have knot in my back; from tight muscle; take RX and exercise for it"  . Depression 01/06/2011  . Exertional dyspnea    "sometimes"  . Genital HSV   . GERD (gastroesophageal reflux disease) 02/17/2011  . Migraines 11/08/11   "@ least twice/month"  . 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  . Mood swings 11/08/11   "I go back and forth; real bad"  . Sickle cell anemia ([name])   . Sickle cell anemia with crisis ([place])   . Thrombocytosis ([place])  11/09/2015  . Trichotillomania    h/o    Patient Active Problem List   Diagnosis Date Noted  . Shortness of breath   . Sensory abnormality of lumbar dermatome distribution   . Chest pain   . Sickle cell pain crisis ([place]) 02/02/2017  . Hb-SS disease with vaso-occlusive crisis ([place]) 12/10/2016  . Chronic anticoagulation   . Sickle cell anemia ([place]) 05/19/2016  . History of DVT (deep vein thrombosis) 04/17/2016  . Thrombocytosis ([place]) 11/09/2015  . Hyperbilirubinemia 11/09/2015  . Pain aggravated by standing 10/06/2015  . Chronic pain 08/04/2015  . Herpes simplex 07/14/2015  . Anemia of chronic disease   . Left groin pain 08/15/2013  . Sickle cell crisis ([place]) 07/31/2013  . 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 Hb-SS disease without crisis ([place]) 1991/03/25    Past Surgical History:  Procedure Laterality Date  . CHOLECYSTECTOMY  05/2010  . DILATION AND CURETTAGE OF UTERUS  02/20/11   S/P miscarriage  . IR GENERIC HISTORICAL  12/23/2015   IR FLUORO GUIDE CV LINE RIGHT 12/23/2015 Jacqulynn Cadet, MD [facility]  . IR GENERIC HISTORICAL  12/23/2015   IR US GUIDE VASC ACCESS RIGHT 12/23/2015 Jacqulynn Cadet, MD [facility]    OB History    Gravida Para Term Preterm AB Living   5 2 2  0 3 2   SAB TAB Ectopic Multiple Live Births   3  0 0 0 2      Obstetric Comments   Miscarried in October 2012 at about 7 weeks       Home Medications    Prior to Admission medications   Medication Sig Start Date End Date Taking? Authorizing Provider  aspirin 81 MG chewable tablet Chew 1 tablet (81 mg total) by mouth daily. 12/15/16   [name], MD  hydroxyurea (HYDREA) 500 MG capsule Take 2 capsules (1,000 mg total) by mouth daily. May take with food to minimize GI side effects. 04/18/16   Tresa Garter, MD  medroxyPROGESTERone (DEPO-PROVERA) 150 MG/ML injection Inject 1 mL (150 mg total) into the muscle every 3 (three) months. 09/21/16   Shelly Bombard, MD  morphine (MS CONTIN) 60 MG 12 hr tablet Take 60 mg by mouth every 12 (twelve) hours as needed for pain.     [provider]  Oxycodone HCl 20 MG TABS Take 1 tablet by mouth every 4 (four) hours as needed (pain).  01/17/17   [provider]  rivaroxaban (XARELTO) 20 MG TABS tablet Take 20 mg by mouth daily with supper.     [provider]  valACYclovir (VALTREX) 1000 MG tablet Take 1 tablet (1,000 mg total) by mouth daily. 05/02/16   [name], MD    Family History Family History  Problem Relation Age of Onset  . Sickle cell trait Mother   . Sickle cell trait Father   . Diabetes Maternal Grandmother   . Diabetes Paternal Grandmother   . Hypertension Paternal Grandmother   . Diabetes Maternal Grandfather     Social History Social History   Tobacco Use  . Smoking status: Former Smoker    Packs/day: 0.25    Years: 1.00    Pack years: 0.25    Types: Cigarettes    Last attempt to quit: 03/25/2013    Years since quitting: 4.0  . Smokeless tobacco: Never Used  Substance Use Topics  . Alcohol use: No    Comment: denies  . Drug use: No    Comment: "Clean for 3 months"     Allergies   Food and Latex   Review of Systems Review of Systems  All other systems reviewed and are negative.    Physical Exam Updated Vital Signs BP 115/63   Pulse 92   Temp 98 F (36.7 C) (Oral)   Resp 17   SpO2 100%   Physical Exam  Constitutional: She is oriented to person, place, and time. She appears well-developed and well-nourished. No distress.  HENT:  Head: Atraumatic.  Eyes: Conjunctivae are normal.  Neck: Neck supple.  Cardiovascular: Normal rate, regular rhythm and intact distal pulses.  Pulmonary/Chest: Effort normal and breath sounds normal.  Abdominal: Soft. Bowel sounds are normal. There is no tenderness.  Musculoskeletal: She exhibits tenderness (Tenderness along thoracic spine and paraspinal muscle on palpation without any  overlying skin changes step-off.).  Neurological: She is alert and oriented to person, place, and time.  Equal strength to all 4 extremities  Skin: No rash noted.  Psychiatric: She has a normal mood and affect.  Nursing note and vitals reviewed.    ED Treatments / Results  Labs (all labs ordered are listed, but only abnormal results are displayed) Labs Reviewed  COMPREHENSIVE METABOLIC PANEL - Abnormal; Notable for the following components:      Result Value   Glucose, Bld 124 (*)    Total Bilirubin 2.0 (*)    All other  components within normal limits  CBC WITH DIFFERENTIAL/PLATELET - Abnormal; Notable for the following components:   WBC 15.1 (*)    RBC 3.26 (*)    Hemoglobin 9.2 (*)    HCT 27.0 (*)    RDW 18.8 (*)    Platelets 514 (*)    Neutro Abs 11.5 (*)    All other components within normal limits  RETICULOCYTES - Abnormal; Notable for the following components:   Retic Ct Pct 11.8 (*)    RBC. 3.26 (*)    Retic Count, Absolute 384.7 (*)    All other components within normal limits  PREGNANCY, URINE    EKG  EKG Interpretation None       Radiology No results found.  Procedures Procedures (including critical care time)  Medications Ordered in ED Medications  0.45 % sodium chloride infusion ( Intravenous New Bag/Given 04/01/17 1402)  HYDROmorphone (DILAUDID) injection 2 mg (not administered)    Or  HYDROmorphone (DILAUDID) injection 2 mg (not administered)  HYDROmorphone (DILAUDID) injection 2 mg (2 mg Intravenous Given 04/01/17 1550)    Or  HYDROmorphone (DILAUDID) injection 2 mg ( Subcutaneous See Alternative 04/01/17 1550)  ondansetron (ZOFRAN) injection 4 mg (not administered)  heparin lock flush 100 unit/mL (not administered)  HYDROmorphone (DILAUDID) injection 2 mg (2 mg Intravenous Given 04/01/17 1402)    Or  HYDROmorphone (DILAUDID) injection 2 mg ( Subcutaneous See Alternative 04/01/17 1402)  HYDROmorphone (DILAUDID) injection 2 mg (2 mg  Intravenous Given 04/01/17 1455)    Or  HYDROmorphone (DILAUDID) injection 2 mg ( Subcutaneous See Alternative 04/01/17 1455)  diphenhydrAMINE (BENADRYL) injection 12.5 mg (12.5 mg Intravenous Given 04/01/17 1402)  diphenhydrAMINE (BENADRYL) injection 25 mg (25 mg Intravenous Given 04/01/17 1550)     Initial Impression / Assessment and Plan / ED Course  I have reviewed the triage vital signs and the nursing notes.  Pertinent labs & imaging results that were available during my care of the patient were reviewed by me and considered in my medical decision making (see chart for details).     BP 105/73   Pulse 100   Temp 98 F (36.7 C) (Oral)   Resp 12   SpO2 99%    Final Clinical Impressions(s) / ED Diagnoses   Final diagnoses:  Sickle cell pain crisis [facility])    ED Discharge Orders    None     1:45 PM This is an opiate tolerance sickle cell patient here with her usual sickle cell related pain. No sxs to suggest acute chest.  She is well appearing.  Will manage her pain and determine disposition.   4:27 PM After receiving 3 doses of pain medication pt does not feel comfortable enough to go home and request to be admitted. Will call for admission.   5:03 PM Appreciate consultation from [facility] Dr. Starla Link who agrees to see pt in the ER and will admit for further management of her sickle cell crisis.     Domenic Moras, PA-C 04/01/17 1704    Sherwood Gambler, MD 04/02/17 [phone]

## 2017-04-02 DIAGNOSIS — G894 Chronic pain syndrome: Secondary | ICD-10-CM

## 2017-04-02 DIAGNOSIS — D5700 Hb-SS disease with crisis, unspecified: Principal | ICD-10-CM

## 2017-04-02 DIAGNOSIS — Z7901 Long term (current) use of anticoagulants: Secondary | ICD-10-CM

## 2017-04-02 DIAGNOSIS — D72829 Elevated white blood cell count, unspecified: Secondary | ICD-10-CM

## 2017-04-02 DIAGNOSIS — D638 Anemia in other chronic diseases classified elsewhere: Secondary | ICD-10-CM

## 2017-04-02 LAB — CBC WITH DIFFERENTIAL/PLATELET
BASOS PCT: 0 %
Basophils Absolute: 0 10*3/uL (ref 0.0–0.1)
EOS PCT: 2 %
Eosinophils Absolute: 0.5 10*3/uL (ref 0.0–0.7)
HEMATOCRIT: 25 % — AB (ref 36.0–46.0)
Hemoglobin: 8.6 g/dL — ABNORMAL LOW (ref 12.0–15.0)
LYMPHS ABS: 4.1 10*3/uL — AB (ref 0.7–4.0)
Lymphocytes Relative: 18 %
MCH: 28.3 pg (ref 26.0–34.0)
MCHC: 34.4 g/dL (ref 30.0–36.0)
MCV: 82.2 fL (ref 78.0–100.0)
MONO ABS: 1.8 10*3/uL — AB (ref 0.1–1.0)
MONOS PCT: 8 %
Neutro Abs: 16.2 10*3/uL — ABNORMAL HIGH (ref 1.7–7.7)
Neutrophils Relative %: 72 %
PLATELETS: 487 10*3/uL — AB (ref 150–400)
RBC: 3.04 MIL/uL — AB (ref 3.87–5.11)
RDW: 19 % — AB (ref 11.5–15.5)
WBC: 22.6 10*3/uL — AB (ref 4.0–10.5)

## 2017-04-02 LAB — MAGNESIUM: Magnesium: 2.2 mg/dL (ref 1.7–2.4)

## 2017-04-02 LAB — RETICULOCYTES
RBC.: 3.04 MIL/uL — AB (ref 3.87–5.11)
RETIC COUNT ABSOLUTE: 355.7 10*3/uL — AB (ref 19.0–186.0)
Retic Ct Pct: 11.7 % — ABNORMAL HIGH (ref 0.4–3.1)

## 2017-04-02 MED ORDER — MORPHINE SULFATE ER 30 MG PO TBCR
60.0000 mg | EXTENDED_RELEASE_TABLET | Freq: Two times a day (BID) | ORAL | Status: DC
  Administered 2017-04-02 – 2017-04-03 (×2): 60 mg via ORAL
  Filled 2017-04-02 (×3): qty 2

## 2017-04-02 MED ORDER — OXYCODONE HCL 5 MG PO TABS: 20.0000 mg | ORAL_TABLET | ORAL | Status: DC

## 2017-04-02 MED ORDER — HYDROMORPHONE 1 MG/ML IV SOLN: INTRAVENOUS | Status: DC

## 2017-04-02 MED ORDER — HYDROMORPHONE 1 MG/ML IV SOLN
INTRAVENOUS | Status: DC
  Administered 2017-04-02: 9.6 mg via INTRAVENOUS
  Administered 2017-04-02: 4.72 mg via INTRAVENOUS
  Administered 2017-04-03: 05:00:00 via INTRAVENOUS
  Administered 2017-04-03: 4.8 mg via INTRAVENOUS
  Filled 2017-04-02 (×2): qty 25

## 2017-04-02 MED ORDER — OXYCODONE HCL 5 MG PO TABS
20.0000 mg | ORAL_TABLET | ORAL | Status: DC | PRN
  Administered 2017-04-02 – 2017-04-03 (×2): 20 mg via ORAL
  Filled 2017-04-02 (×2): qty 4

## 2017-04-02 MED ORDER — SODIUM CHLORIDE 0.9% FLUSH
10.0000 mL | INTRAVENOUS | Status: DC | PRN
  Administered 2017-04-03 (×2): 10 mL
  Filled 2017-04-02 (×2): qty 40

## 2017-04-02 NOTE — Plan of Care (Signed)
  Progressing Health Behavior/Discharge Planning: Ability to manage health-related needs will improve 04/02/2017 0935 - Progressing by Courtnie Brenes, Royetta Crochet, RN Clinical Measurements: Ability to maintain clinical measurements within normal limits will improve 04/02/2017 0935 - Progressing by Greidy Sherard, Royetta Crochet, RN Will remain free from infection 04/02/2017 0935 - Progressing by Nhan Qualley, Royetta Crochet, RN Respiratory complications will improve 04/02/2017 0935 - Progressing by Verlon Carcione, Royetta Crochet, RN Cardiovascular complication will be avoided 04/02/2017 0935 - Progressing by Gursimran Litaker, Royetta Crochet, RN Coping: Level of anxiety will decrease 04/02/2017 0935 - Progressing by Molli Gethers, Royetta Crochet, RN Pain Managment: General experience of comfort will improve 04/02/2017 0935 - Progressing by Hersey Maclellan, Royetta Crochet, RN Note Pain 6/10.  Using PCA. Up independently.  Will continue with plan of care.

## 2017-04-02 NOTE — Progress Notes (Signed)
SICKLE CELL SERVICE PROGRESS NOTE  [name] SAY:[id number] DOB: [birth year] DOA: 04/01/2017 PCP: Ricke Hey, MD  Assessment/Plan: Principal Problem:   Sickle cell crisis [facility]) Active Problems:   Chronic pain   History of DVT (deep vein thrombosis)  1. Hb SS with crisis: Continue PCA at adjusted dose. Continue Toradol and IVF.  2. Chronic Pain: Pt takes MS Contin on an as needed basis if she has an exacerbation of pain which requires her short acting more than every 4 hours to control pain. So will schedule MS Contin 60 mg every 12 hours.  3. Anemia of Chronic Disease: Hb at baseline. She reports that she takes Hydrea. Her MCV however is non consistent with adherence to Hydrea. She has a good reticulocytosis so will continue Hydrea.  4. Leukocytosis: She has no evidence of infection. This most likely reflects  Inflammatory response that is consistent with a sickle cell crisis and possible de-margination of WBC's as a result of stress.  5. Chronic Anticoagulation: Continue Xarelto.  6. Disposition: Pt feels that she will be able to go home tomorrow.    Code Status: Full Code Family Communication: N/A Disposition Plan: Anticipate discharge tomorrow  Lorimer Tiberio A.  Pager [phone]. If 7PM-7AM, please contact night-coverage.  04/02/2017, 2:58 PM  LOS: 1 day   Interim History: Pt reports pain is localized to low back at an intensity of 4/10 currently. She has used 49.49 mg of Dilaudid with 43/33:demands/deliveries since admission. She states that she feels ready to go home tomorrow.    Consultants:  None  Procedures:  None  Antibiotics:  None   Objective: Vitals:   04/02/17 0543 04/02/17 0905 04/02/17 1152 04/02/17 1426  BP: 116/65  112/66 116/66  Pulse: 84  88 98  Resp: 16 11 12 14   Temp: 98 F (36.7 C)  98 F (36.7 C) 98 F (36.7 C)  TempSrc: Oral  Oral Oral  SpO2: 99% 96% 98% 98%  Weight:      Height:       Weight change:   Intake/Output  Summary (Last 24 hours) at 04/02/2017 1458 Last data filed at 04/02/2017 1427 Gross per 24 hour  Intake 480 ml  Output 2 ml  Net 478 ml      Physical Exam General: Alert, awake, oriented x3, in no acute distress.  HEENT: [place]/AT PEERL, EOMI, anicteric Neck: Trachea midline,  no masses, no thyromegal,y no JVD, no carotid bruit OROPHARYNX:  Moist, No exudate/ erythema/lesions.  Heart: Regular rate and rhythm, without murmurs, rubs, gallops, PMI non-displaced, no heaves or thrills on palpation.  Lungs: Clear to auscultation, no wheezing or rhonchi noted. No increased vocal fremitus resonant to percussion  Abdomen: Soft, nontender, nondistended, positive bowel sounds, no masses no hepatosplenomegaly noted.  Neuro: No focal neurological deficits noted cranial nerves II through XII grossly intact. Strength at baseline in bilateral upper and lower extremities. Musculoskeletal: No warmth swelling or erythema around joints, no spinal tenderness noted. Psychiatric: Patient alert and oriented x3, good insight and cognition, good recent to remote recall.    Data Reviewed: Basic Metabolic Panel: Recent Labs  Lab 04/01/17 1243 04/02/17 0900  NA 135  --   K 3.6  --   CL 106  --   CO2 22  --   GLUCOSE 124*  --   BUN 6  --   CREATININE 0.76  --   CALCIUM 8.9  --   MG  --  2.2   Liver Function Tests: Recent Labs  Lab 04/01/17 1243  AST 17  ALT 15  ALKPHOS 66  BILITOT 2.0*  PROT 7.9  ALBUMIN 4.4   No results for input(s): LIPASE, AMYLASE in the last 168 hours. No results for input(s): AMMONIA in the last 168 hours. CBC: Recent Labs  Lab 04/01/17 1243 04/02/17 0900  WBC 15.1* 22.6*  NEUTROABS 11.5* 16.2*  HGB 9.2* 8.6*  HCT 27.0* 25.0*  MCV 82.8 82.2  PLT 514* 487*   Cardiac Enzymes: No results for input(s): CKTOTAL, CKMB, CKMBINDEX, TROPONINI in the last 168 hours. BNP (last 3 results) No results for input(s): BNP in the last 8760 hours.  ProBNP (last 3 results) No  results for input(s): PROBNP in the last 8760 hours.  CBG: No results for input(s): GLUCAP in the last 168 hours.  No results found for this or any previous visit (from the past 240 hour(s)).   Studies: No results found.  Scheduled Meds: . HYDROmorphone   Intravenous Q4H  . hydroxyurea  500 mg Oral BID  . ketorolac  30 mg Intravenous Q6H  . morphine  60 mg Oral Q12H  . multivitamin with minerals  1 tablet Oral Daily  . rivaroxaban  20 mg Oral Q supper  . senna-docusate  1 tablet Oral BID   Continuous Infusions: . sodium chloride 100 mL/hr at 04/02/17 0244  . diphenhydrAMINE (BENADRYL) IVPB(SICKLE CELL ONLY)      Principal Problem:   Sickle cell crisis ([name]) Active Problems:   Chronic pain   History of DVT (deep vein thrombosis)     In excess of 25 minutes spent during this visit. Greater than 50% involved face to face contact with the patient for assessment, counseling and coordination of care.

## 2017-04-02 NOTE — Progress Notes (Signed)
Patient refusing telemetry this morning

## 2017-04-03 DIAGNOSIS — D473 Essential (hemorrhagic) thrombocythemia: Secondary | ICD-10-CM

## 2017-04-03 LAB — CBC WITH DIFFERENTIAL/PLATELET
BASOS ABS: 0.1 10*3/uL (ref 0.0–0.1)
BASOS PCT: 0 %
EOS ABS: 0.7 10*3/uL (ref 0.0–0.7)
EOS PCT: 4 %
HEMATOCRIT: 24.1 % — AB (ref 36.0–46.0)
HEMOGLOBIN: 8.1 g/dL — AB (ref 12.0–15.0)
LYMPHS ABS: 2.6 10*3/uL (ref 0.7–4.0)
Lymphocytes Relative: 16 %
MCH: 28 pg (ref 26.0–34.0)
MCHC: 33.6 g/dL (ref 30.0–36.0)
MCV: 83.4 fL (ref 78.0–100.0)
MONO ABS: 1.1 10*3/uL — AB (ref 0.1–1.0)
MONOS PCT: 7 %
Neutro Abs: 11.8 10*3/uL — ABNORMAL HIGH (ref 1.7–7.7)
Neutrophils Relative %: 73 %
Platelets: 481 10*3/uL — ABNORMAL HIGH (ref 150–400)
RBC: 2.89 MIL/uL — ABNORMAL LOW (ref 3.87–5.11)
RDW: 19.8 % — AB (ref 11.5–15.5)
WBC: 16.3 10*3/uL — ABNORMAL HIGH (ref 4.0–10.5)

## 2017-04-03 IMAGING — DX DG CHEST 1V
1 series · 1 of 1 positions shown · non-contrast
Comparison: Body CT 02/26/2016

CLINICAL DATA: Back pain, chest pain and shortness of breath.

EXAM:
CHEST 1 VIEW

[chest ap]
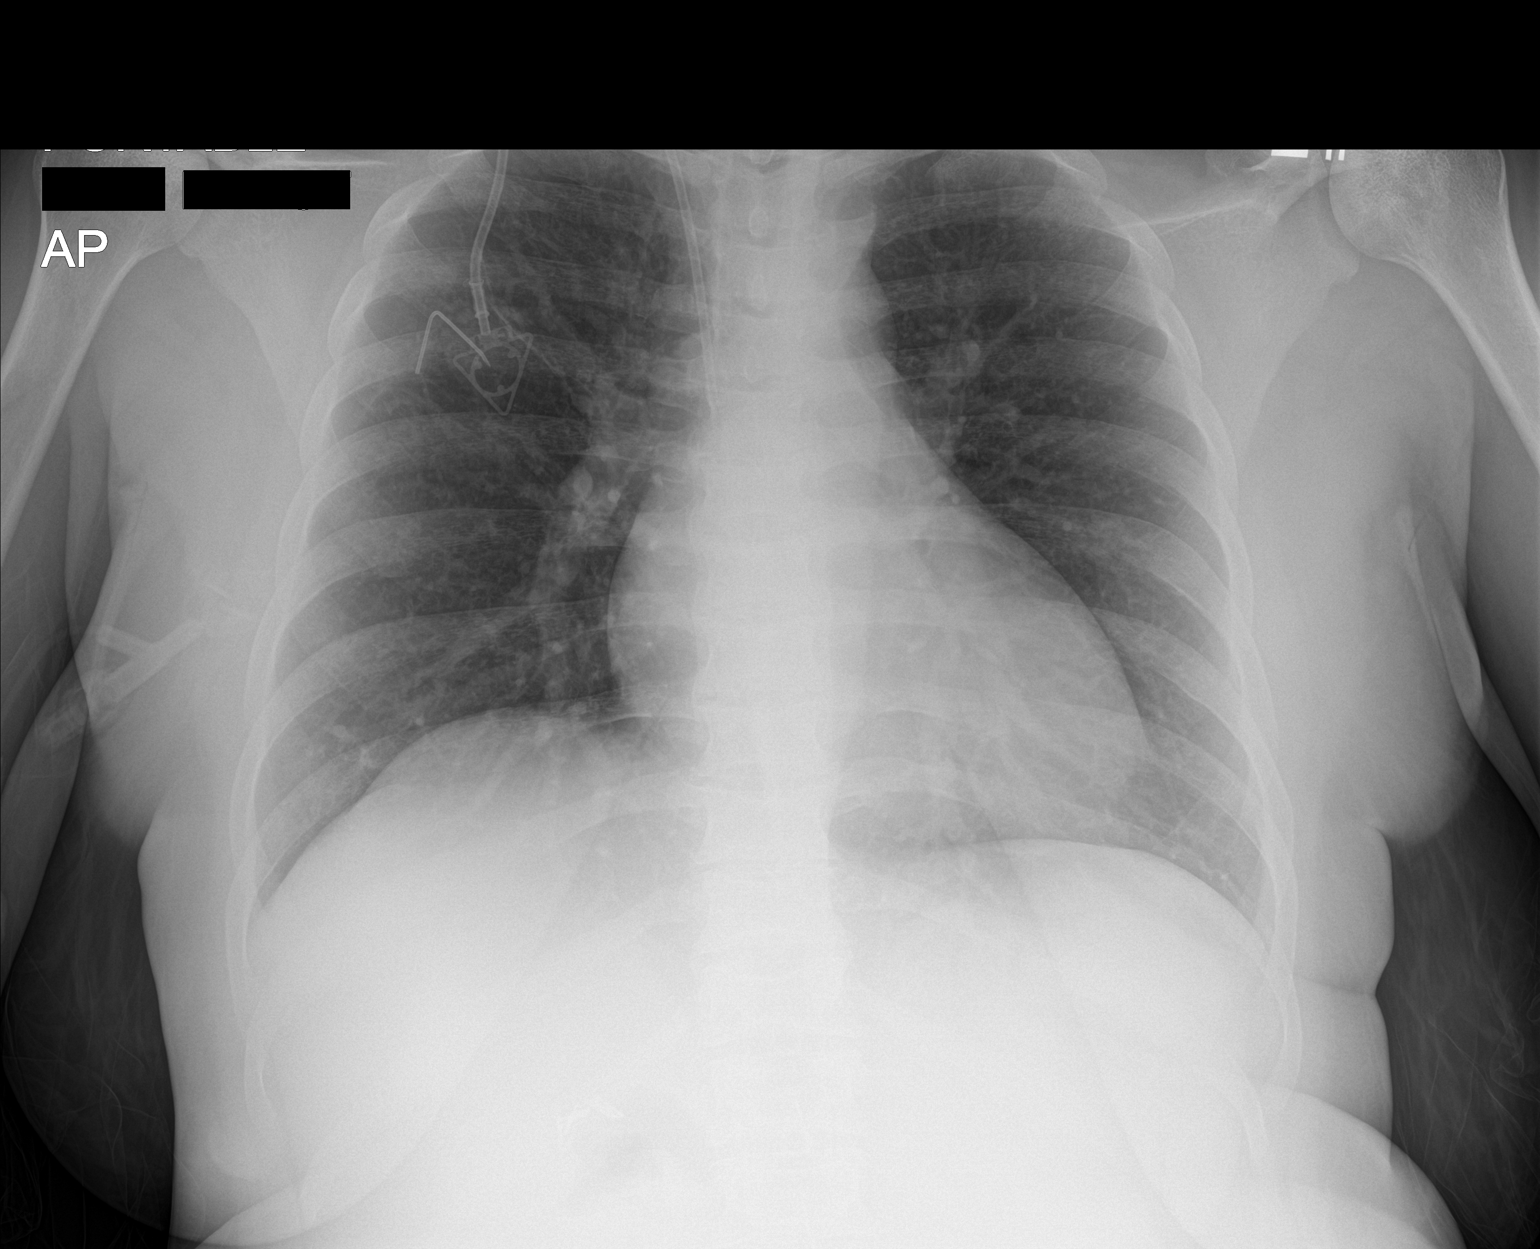

[1 of 1 positions shown; findings below may reference images not displayed]

FINDINGS: Injectable right IJ approach central venous catheter terminates in
the superior vena cava.

The cardiac silhouette is mildly enlarged. Mediastinal contours
appear intact.

There is no evidence of focal airspace consolidation, pleural
effusion or pneumothorax.

Osseous structures are without acute abnormality. Sclerotic changes
of bilateral femoral heads, likely due to avascular necrosis. Soft
tissues are grossly normal.
IMPRESSION: Mildly enlarged cardiac silhouette, otherwise no evidence of acute
disease.

## 2017-04-03 MED ORDER — OXYCODONE HCL 5 MG PO TABS: 20.0000 mg | ORAL_TABLET | ORAL | Status: DC | PRN

## 2017-04-03 MED ORDER — HEPARIN SOD (PORK) LOCK FLUSH 100 UNIT/ML IV SOLN
500.0000 [IU] | INTRAVENOUS | Status: AC | PRN
  Administered 2017-04-03: 500 [IU]

## 2017-04-03 NOTE — Care Management Note (Signed)
Case Management Note  Patient Details  Name: [name] MRN: [id number] Date of Birth: 10-05-1990  Subjective/Objective: 26 y/o f admitted w/SSC. Hx DVT. From home.                   Action/Plan:d/c home.   Expected Discharge Date:  04/03/17               Expected Discharge Plan:  Home/Self Care  In-House Referral:     Discharge planning Services  CM Consult  Post Acute Care Choice:    Choice offered to:     DME Arranged:    DME Agency:     HH Arranged:    HH Agency:     Status of Service:  Completed, signed off  If discussed at [employer] of Stay Meetings, dates discussed:    Additional Comments:  Dessa Phi, RN 04/03/2017, 1:46 PM

## 2017-04-03 NOTE — Discharge Summary (Signed)
[name] MRN: [id number] DOB/AGE: 1990/12/25 26 y.o.  Admit date: 04/01/2017 Discharge date: 04/03/2017  Primary Care Physician:  Ricke Hey, MD   Discharge Diagnoses:   Patient Active Problem List   Diagnosis Date Noted  . Major depression, chronic 01/06/2011    Priority: High  . Leukocytosis   . Chronic anticoagulation   . Sickle cell anemia ([place]) 05/19/2016  . History of DVT (deep vein thrombosis) 04/17/2016  . Thrombocytosis ([place]) 11/09/2015  . Hyperbilirubinemia 11/09/2015  . Chronic pain 08/04/2015  . Herpes simplex 07/14/2015  . Anemia of chronic disease   . 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 Hb-SS disease without crisis ([place]) [birth year]    DISCHARGE MEDICATION: Allergies as of 04/03/2017      Reactions   Food Hives, Other (See Comments)   Pt is allergic to carrots.     Latex Rash      Medication List    STOP taking these medications   aspirin 81 MG chewable tablet   valACYclovir 1000 MG tablet Commonly known as:  VALTREX     TAKE these medications   acetaminophen 500 MG tablet Commonly known as:  TYLENOL Take 1,000 mg every 6 (six) hours as needed by mouth for moderate pain.   aspirin-acetaminophen-caffeine 250-250-65 MG tablet Commonly known as:  EXCEDRIN MIGRAINE Take 2 tablets every 6 (six) hours as needed by mouth for headache.   ENERGY CHEWS PO Take 2 tablets daily by mouth.   HAIR SKIN AND NAILS FORMULA PO Take 2 tablets daily by mouth.   hydroxyurea 500 MG capsule Commonly known as:  HYDREA Take 2 capsules (1,000 mg total) by mouth daily. May take with food to minimize GI side effects. What changed:    how much to take  when to take this  additional instructions   medroxyPROGESTERone 150 MG/ML injection Commonly known as:  DEPO-PROVERA Inject 1 mL (150 mg total) into the muscle every 3 (three) months.   morphine 60 MG 12 hr tablet Commonly known as:  MS CONTIN Take 60 mg by mouth every 12 (twelve) hours as needed  for pain.   multivitamin with minerals Tabs tablet Take 1 tablet daily by mouth.   Oxycodone HCl 20 MG Tabs Take 20 mg every 4 (four) hours as needed by mouth (pain).   rivaroxaban 20 MG Tabs tablet Commonly known as:  XARELTO Take 20 mg by mouth daily with supper.         Consults:    SIGNIFICANT DIAGNOSTIC STUDIES:  No results found.     No results found for this or any previous visit (from the past 240 hour(s)).  BRIEF ADMITTING H & P: [name] is a 26 y.o. 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  Patient states that she has been having worsening lower back pain over the last 1-2-day duration, constant, sharp in nature, up to 10 out of 10 in intensity aggravated by movement and touch and not relieved much by her home oral pain medications.  The patient denies any associated fever, chills, headache, lightheadedness, dizziness, chest pain, shortness of breath, cough, abdominal pain, dysuria.     Hospital Course:  Present on Admission: . Chronic pain . Thrombocytosis ([place]) . Leukocytosis . (Resolved) Hb-SS disease with vaso-occlusive crisis ([place]) . Anemia of chronic disease  Opiate tolerant patient with Hb SS admitted with sickle cell crisis  and an essentially uneventful hospital course. Pain was managed with Dilaudid via PCA, Toradol and IVF. As the pain improved transition was made to Oxycodone and at the time of discharge pain was 5/10. She had minimal use of the PCA in the 8 hours prior to discharge and she used the Oxycodone when she did have an increase in pain and felt that the Oxycodone controlled the pain well.   Pt also has MS Contin prescribed for chronic pain which she takes on q 12 hour PRN basis when her short acting taken scheduled does not control her pain. MS Contin was  ordered on a q 12 hour basis. Pt was educated that [place] is to used on a scheduled basis. However patient advised that her PMD who manages her chronic pain has prescribed on a PRN basis. Will not change prescription as this is a chronic issue.   She also has thrombocytosis presumed to be due to bone marrrow activity . She has been prescribed ASA but reports that it was discontinued by her PMD.   Pt is on chronic anticoagulation and Xarelto was continued without interruption throughout hospital stay.   Pt has an anemia of chronic disease and Hb remained at baseline throughout hospital stay.  Pt had an elevated WBC count without signs of infection. She refused repeat CBC with diff on the day of discharge. I recommend follow up CBC in 1-2 days to ensure return to baseline WBC of 12-14.   At the time of discharge, she was without vaso-occlusive pain but did have chronic aching of her back at a level of 5/10 which according to patient is her baseline. She was also ambulatory without oxygen and had no increase in HR or need for oxygen supplementation.    Disposition and Follow-up: Pt discharged home in stable condition.    DISCHARGE EXAM:  General: Alert, awake, oriented x3, in no apparent distress. Pt is well appearing. HEENT: [place]/AT PEERL, EOMI, anicteric Neck: Trachea midline, no masses, no thyromegal,y no JVD, no carotid bruit OROPHARYNX: Moist, No exudate/ erythema/lesions.  Heart: Regular rate and rhythm, without murmurs, rubs, gallops or S3. PMI non-displaced. Exam reveals no decreased pulses. Pulmonary/Chest: Normal effort. Breath sounds normal. No. Apnea. Clear to auscultation,no stridor,  no wheezing and no rhonchi noted. No respiratory distress and no tenderness noted. Abdomen: Soft, nontender, nondistended, normal bowel sounds, no masses no hepatosplenomegaly noted. No fluid wave and no ascites. There is no guarding or rebound. Neuro: Alert and oriented to person, place and time.  Normal motor skills, Displays no atrophy or tremors and exhibits normal muscle tone.  No focal neurological deficits noted cranial nerves II through XII grossly intact. No sensory deficit noted. Strength at baseline in bilateral upper and lower extremities. Gait normal. Musculoskeletal: No warmth swelling or erythema around joints, no spinal tenderness noted. Mood, affect and judgement normal.  Psychiatric: Patient alert and oriented x3, good insight and cognition, good recent to remote recall. Skin: Skin is warm and dry. No bruising, no ecchymosis and no rash noted. Pt is not diaphoretic. No erythema. No pallor    Blood pressure 115/82, pulse 89, temperature 98.3 F (36.8 C), temperature source Oral, resp. rate 11, height 5\' 1"  (1.549 m), weight 92.9 kg (204 lb 12.9 oz), SpO2 97 %, not currently breastfeeding.  Recent Labs    04/01/17 1243 04/02/17 0900  NA 135  --   K 3.6  --   CL 106  --   CO2 22  --  GLUCOSE 124*  --   BUN 6  --   CREATININE 0.76  --   CALCIUM 8.9  --   MG  --  2.2   Recent Labs    04/01/17 1243  AST 17  ALT 15  ALKPHOS 66  BILITOT 2.0*  PROT 7.9  ALBUMIN 4.4   No results for input(s): LIPASE, AMYLASE in the last 72 hours. Recent Labs    04/01/17 1243 04/02/17 0900  WBC 15.1* 22.6*  NEUTROABS 11.5* 16.2*  HGB 9.2* 8.6*  HCT 27.0* 25.0*  MCV 82.8 82.2  PLT 514* 487*     Total time spent including face to face and decision making was greater than 30 minutes  Signed: Iyonnah Ferrante A. 04/03/2017, 10:59 AM

## 2017-04-03 NOTE — Care Management Note (Signed)
Case Management Note  Patient Details  Name: [name] MRN: [id number] Date of Birth: November 15, 1990  Subjective/Objective: 26 y/o f admitted w/SSC. Hx: DVT. From home. Readmit.                   Action/Plan:d/c plan home.   Expected Discharge Date:                  Expected Discharge Plan:  Home/Self Care  In-House Referral:     Discharge planning Services  CM Consult  Post Acute Care Choice:    Choice offered to:     DME Arranged:    DME Agency:     HH Arranged:    HH Agency:     Status of Service:  In process, will continue to follow  If discussed at Long Length of Stay Meetings, dates discussed:    Additional Comments:  Dessa Phi, RN 04/03/2017, 10:25 AM

## 2017-04-06 IMAGING — DX DG SHOULDER 2+V*L*
3 series · 3 of 3 positions shown · non-contrast
Comparison: Left humerus x-rays 04/17/2016. Left shoulder x-rays
05/06/2012, 12/11/2011.

CLINICAL DATA: 25-year-old who fell out of her bed and injured the
left shoulder. Initial encounter. Current history of sickle cell
disease.

EXAM:
LEFT SHOULDER - 2+ VIEW

[shoulder ap]
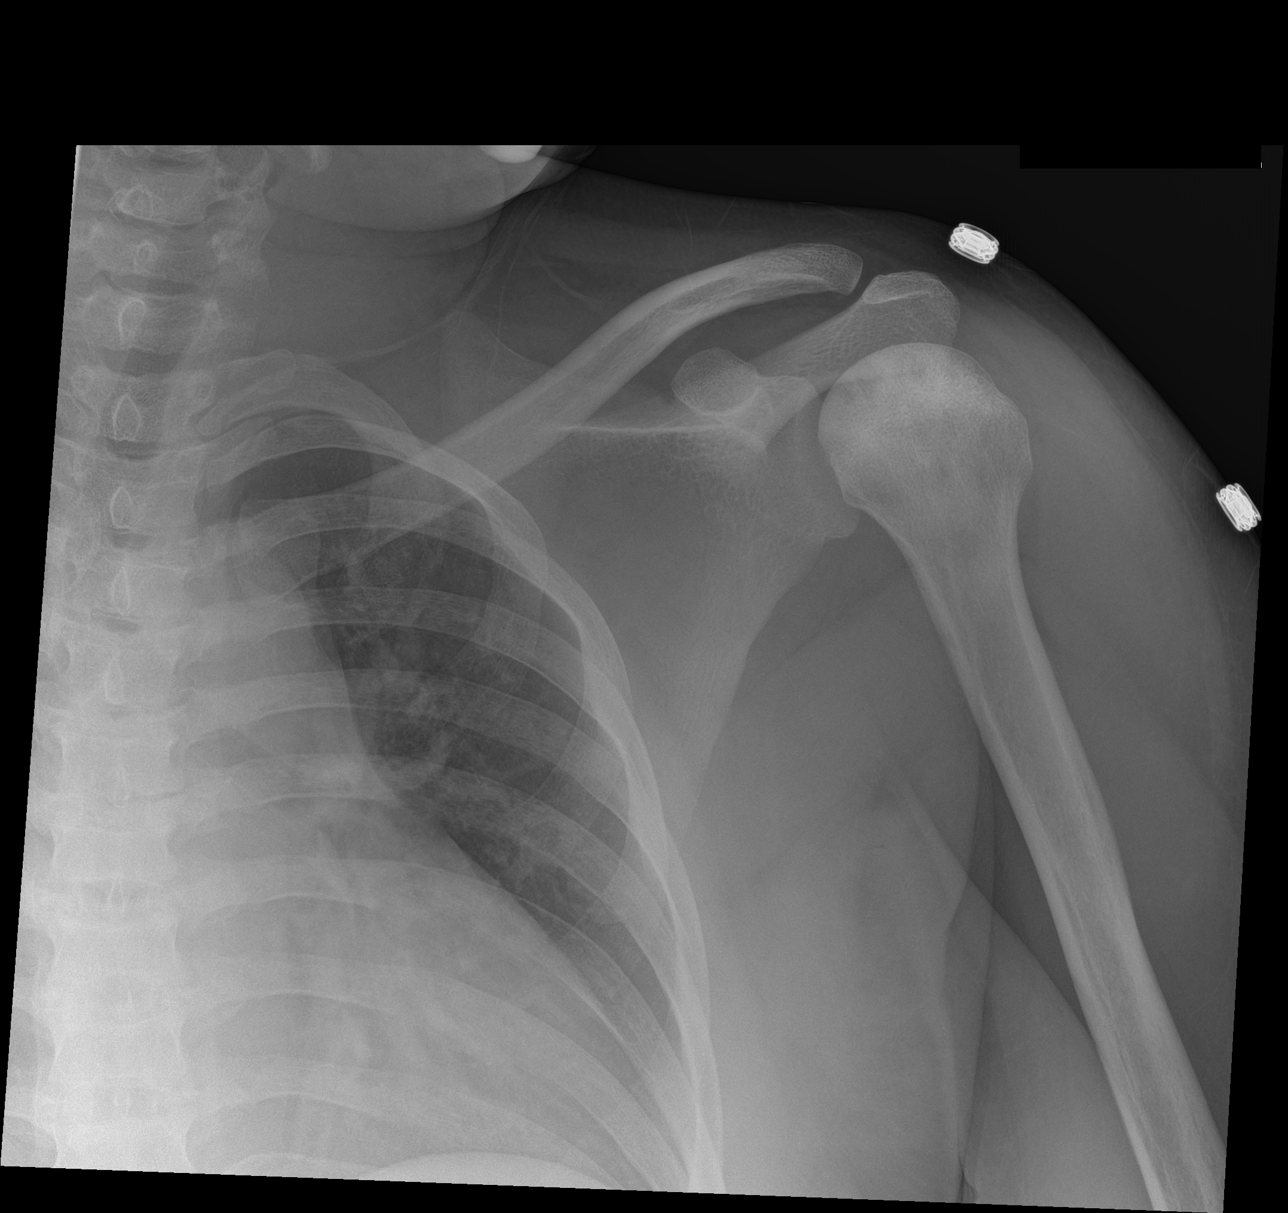

[shoulder y-view]
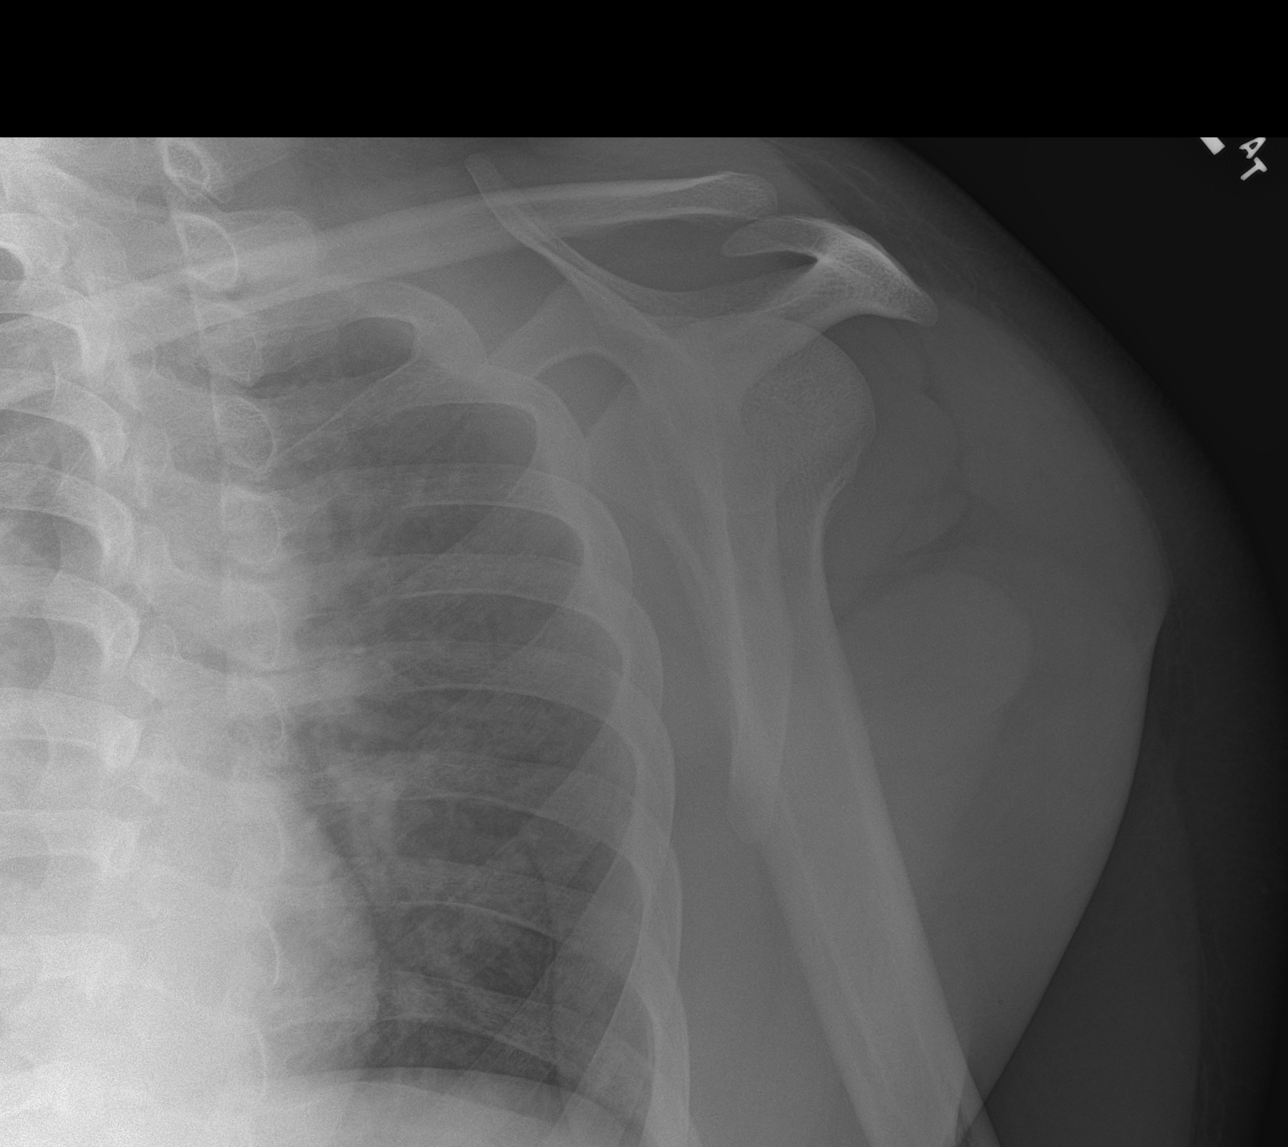

[shoulder axial]
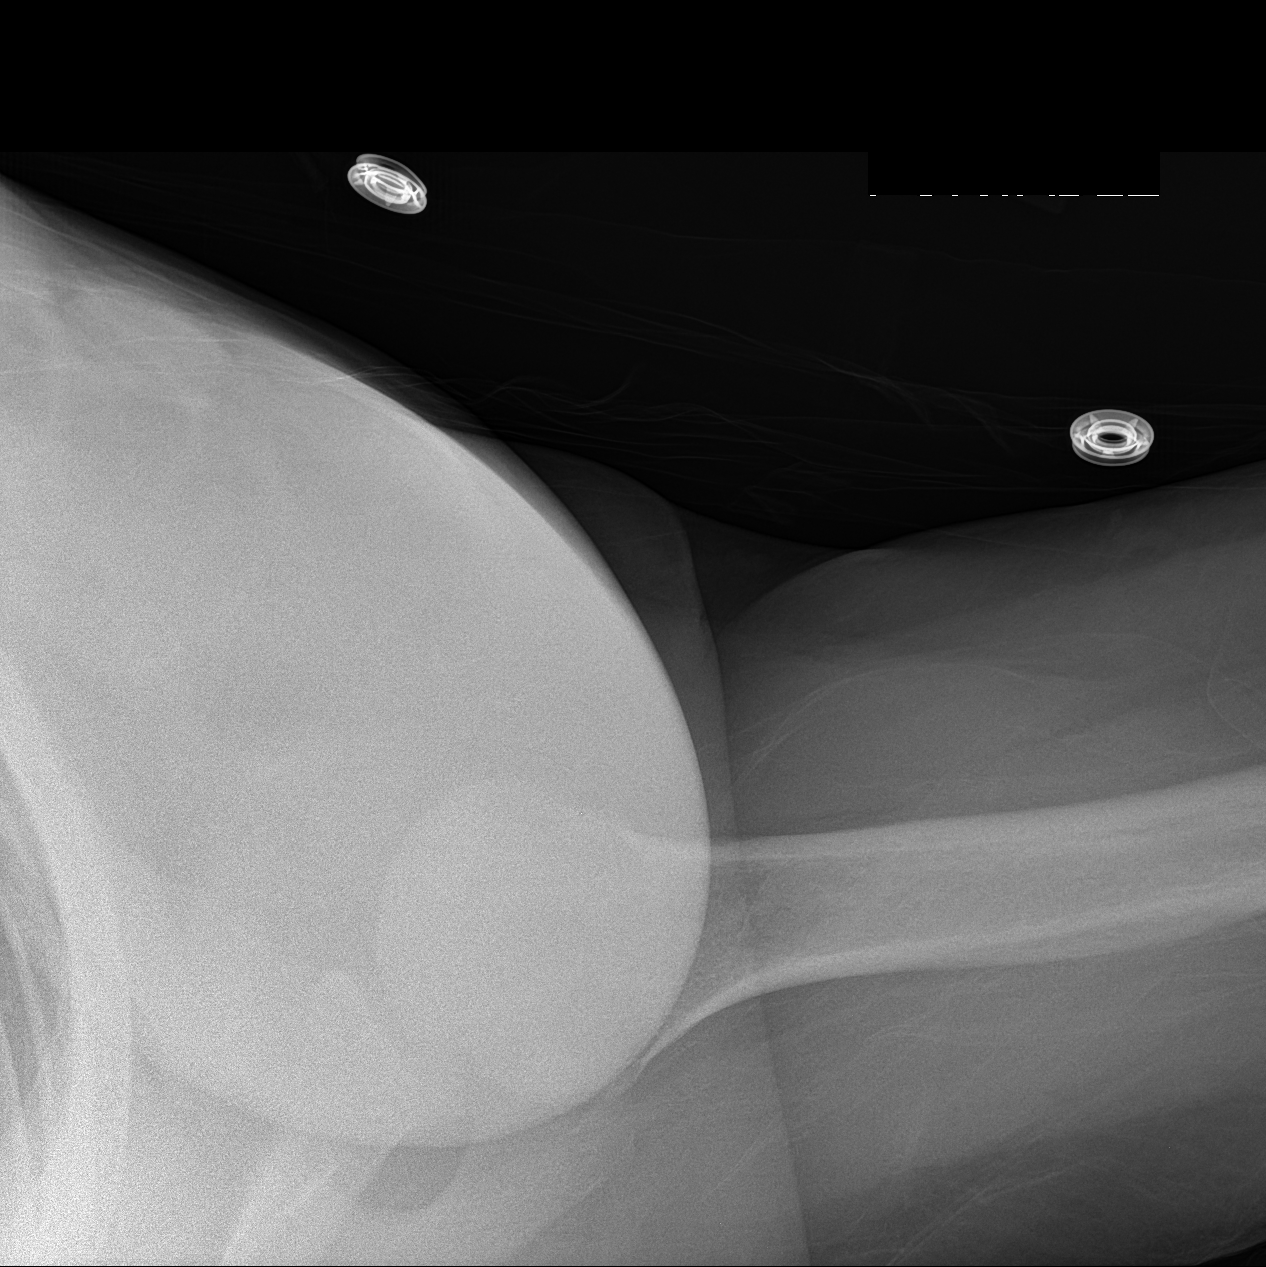

[3 of 3 positions shown; findings below may reference images not displayed]

FINDINGS: Examination was performed portably, in the axial images therefore
less than optimal. No evidence of acute fracture or glenohumeral
dislocation. Acromioclavicular joint intact. Osteonecrosis involving
the humeral head is again noted, and there is no evidence of
osteochondral fracture.
IMPRESSION: No acute osseous abnormality. Stable osteonecrosis involving the
left humeral head.

## 2017-04-12 ENCOUNTER — Ambulatory Visit: Payer: Self-pay | Admitting: Family Medicine

## 2017-04-13 IMAGING — CR DG CHEST 2V
2 series · 2 of 2 positions shown · non-contrast
Comparison: 05/19/2016

CLINICAL DATA: Pt with Hx sickle cell anemia c/o abdominal pain and
back pain onset last night, cough onset last night. Reports pain to
be due to sickle cell anemia GERD, former smoker x 3 years ago

EXAM:
CHEST  2 VIEW

[w chest pa]
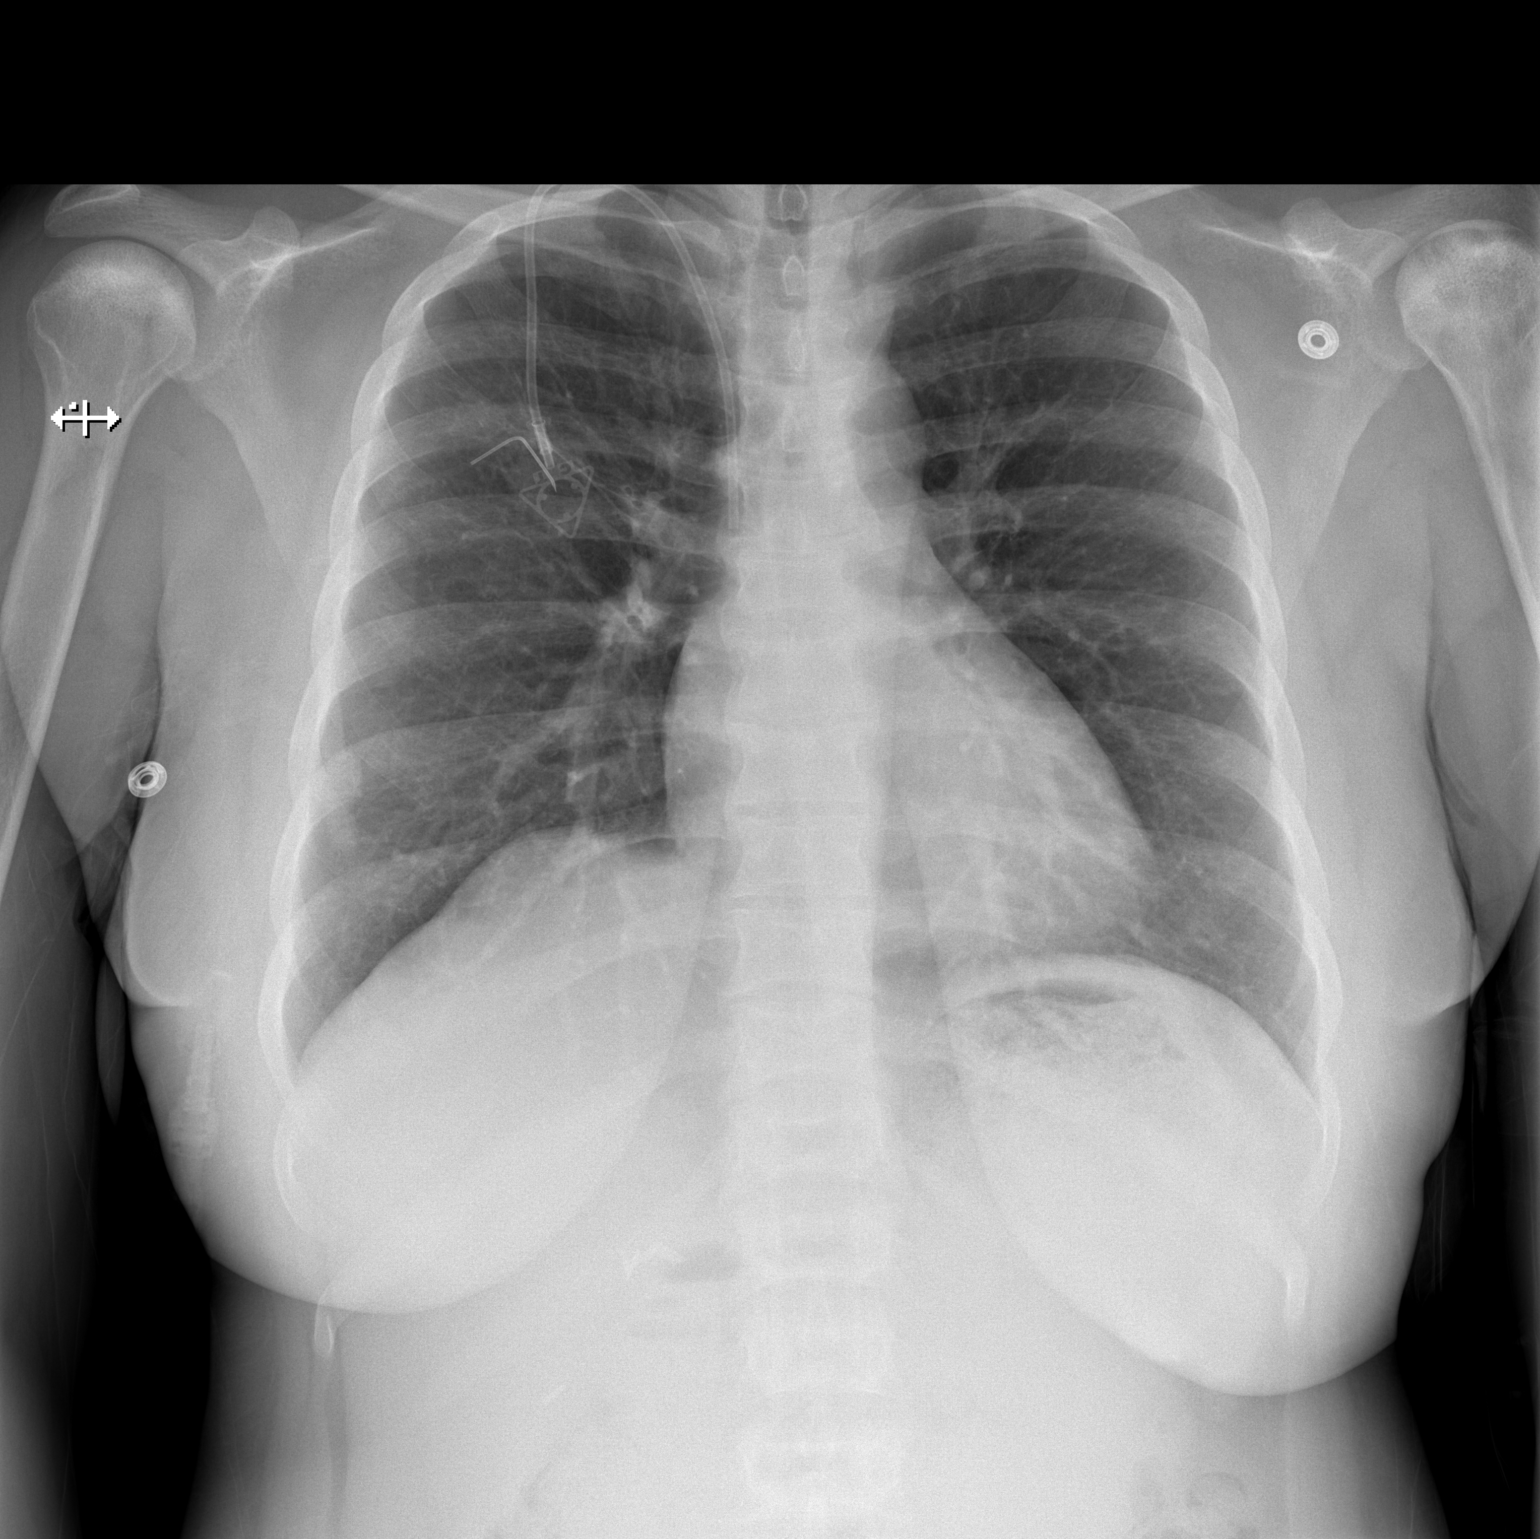

[w chest lat]
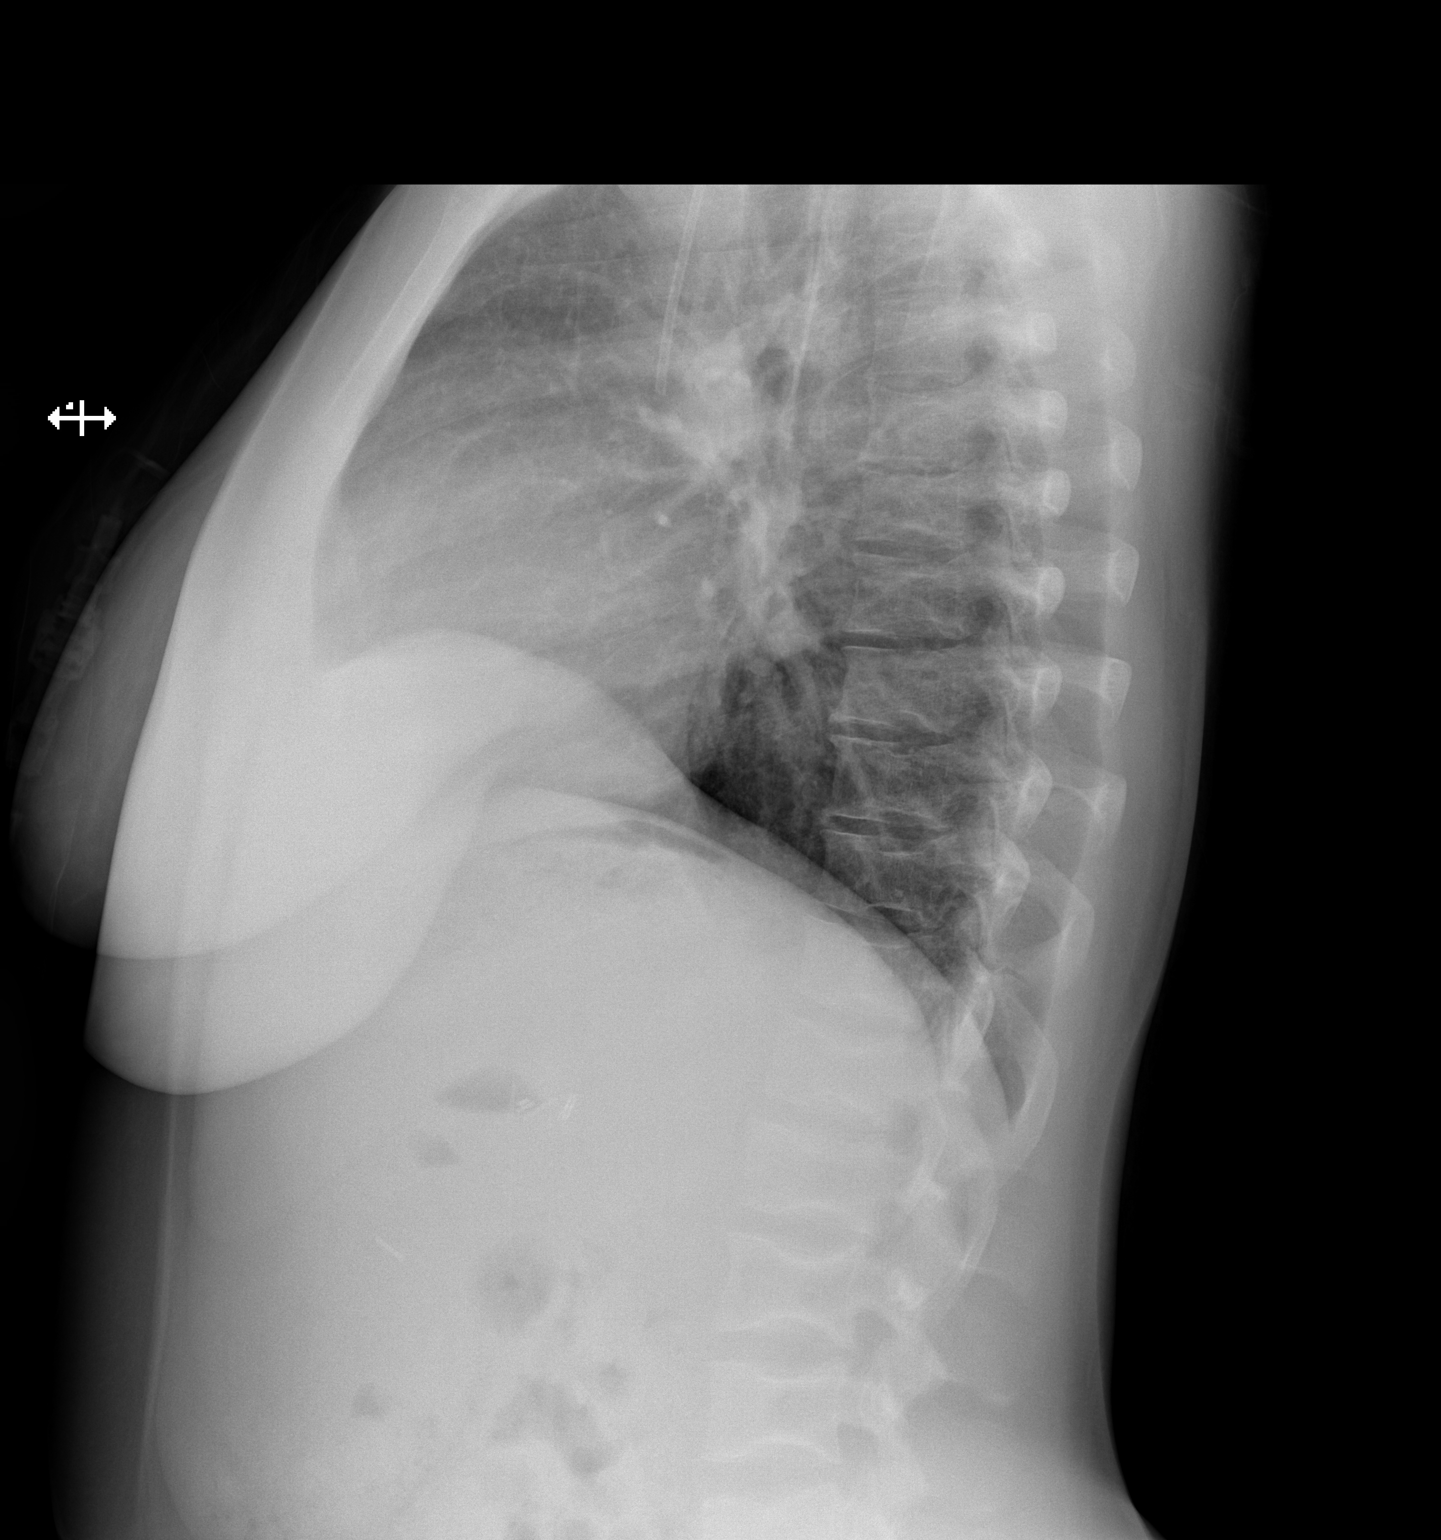

[2 of 2 positions shown; findings below may reference images not displayed]

FINDINGS: Cardiac silhouette is normal in size. No mediastinal or hilar
masses. No evidence of adenopathy.

Clear lungs.  No pleural effusion.  No pneumothorax.

Right anterior chest wall Port-A-Cath is stable with its tip in the
lower superior vena cava.

Sclerosis in both humeral heads consistent with avascular necrosis,
stable.
IMPRESSION: No active cardiopulmonary disease.

## 2017-04-25 IMAGING — CR DG CHEST 1V PORT
1 series · 1 of 1 positions shown · non-contrast
Comparison: CT 06/10/2016 .

CLINICAL DATA: Syncope.  History of sickle cell anemia.

EXAM:
PORTABLE CHEST 1 VIEW

[portable]
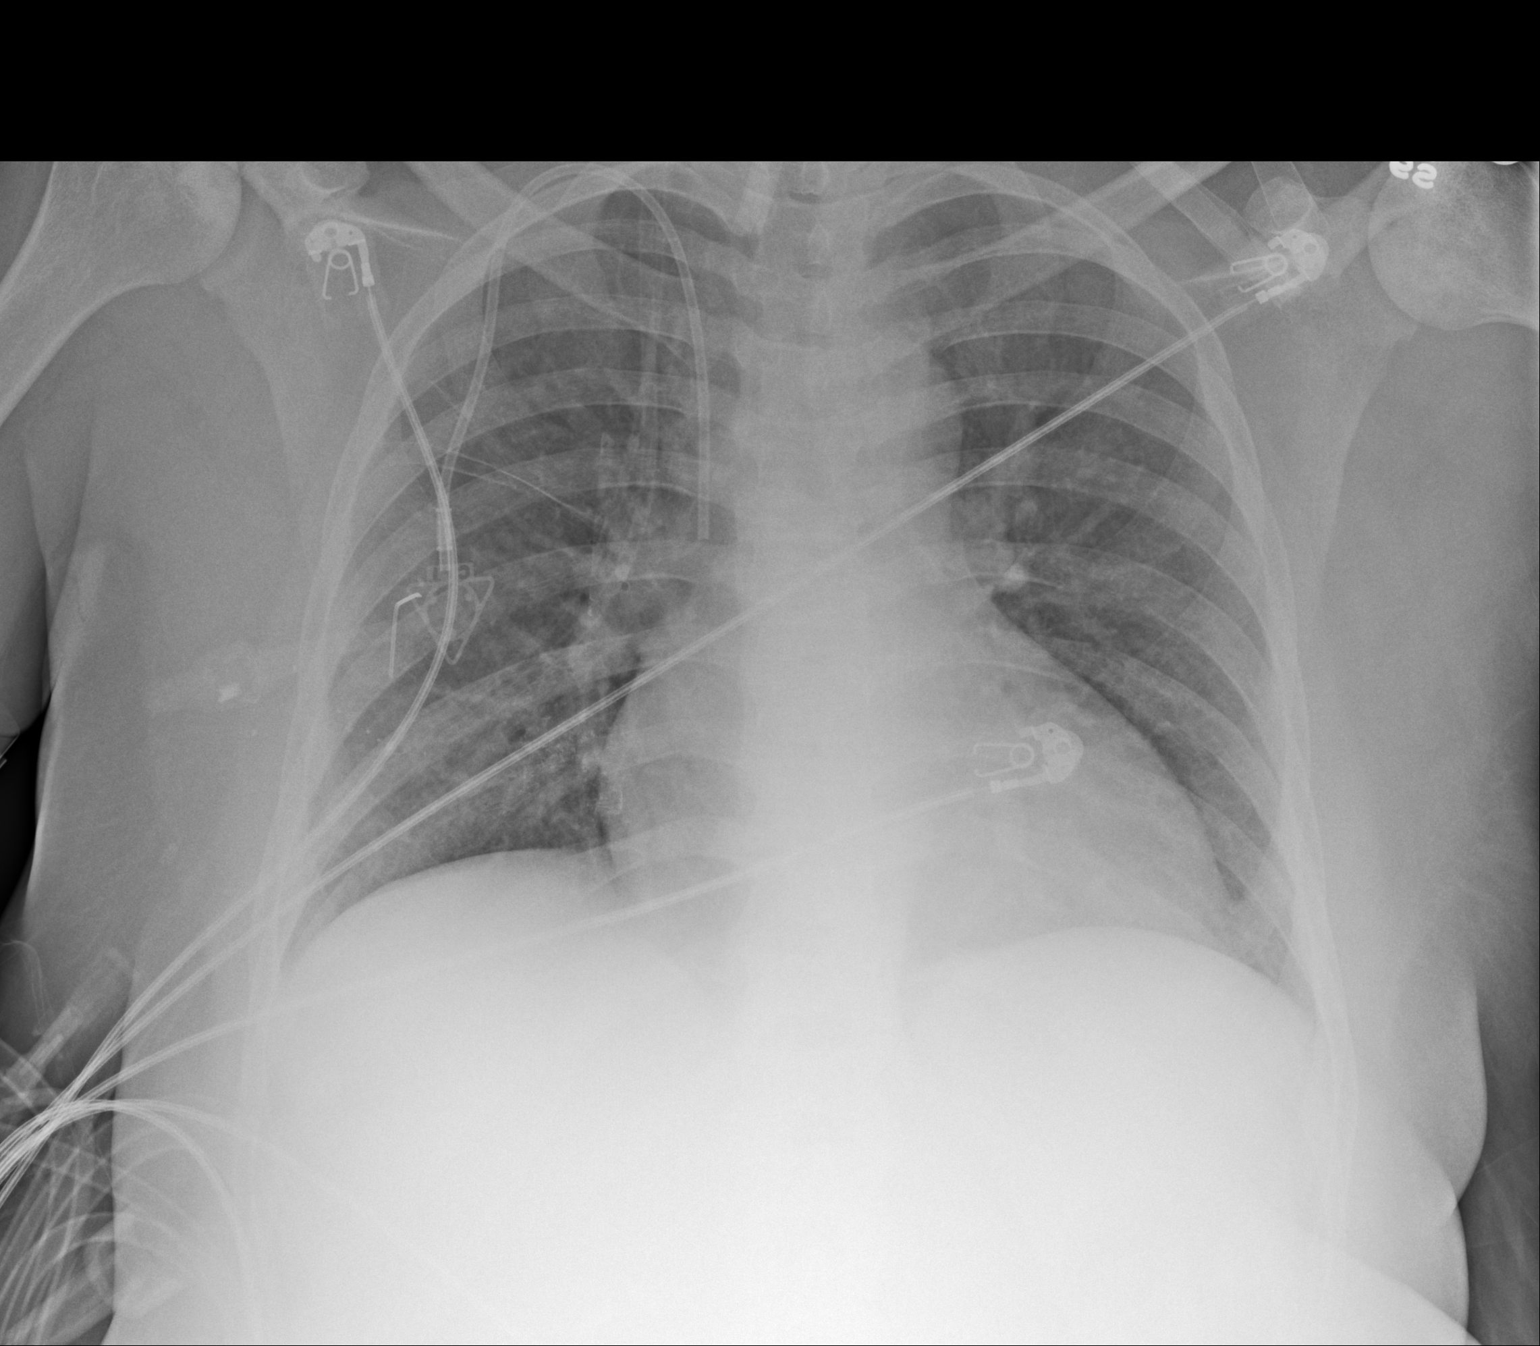

[1 of 1 positions shown; findings below may reference images not displayed]

FINDINGS: PowerPort catheter noted with lead tip in the superior vena cava.
Mild cardiomegaly. No evidence of congestive heart failure. No
pleural effusion or pneumothorax. No acute bony abnormality.
Sclerotic changes of both humeral heads most likely from avascular
necrosis.
IMPRESSION: 1. PowerPort catheter noted with tip projected over superior vena
cava.

2. Borderline cardiomegaly.  No CHF.  No acute pulmonary disease.

3.  Avascular necrosis both humeral heads.

## 2017-04-25 IMAGING — CR DG THORACIC SPINE 3V
2 series · 2 of 2 positions shown · non-contrast
Comparison: None.

CLINICAL DATA: Pain after fall.

EXAM:
THORACIC SPINE - 3 VIEWS

[t-spine ap]
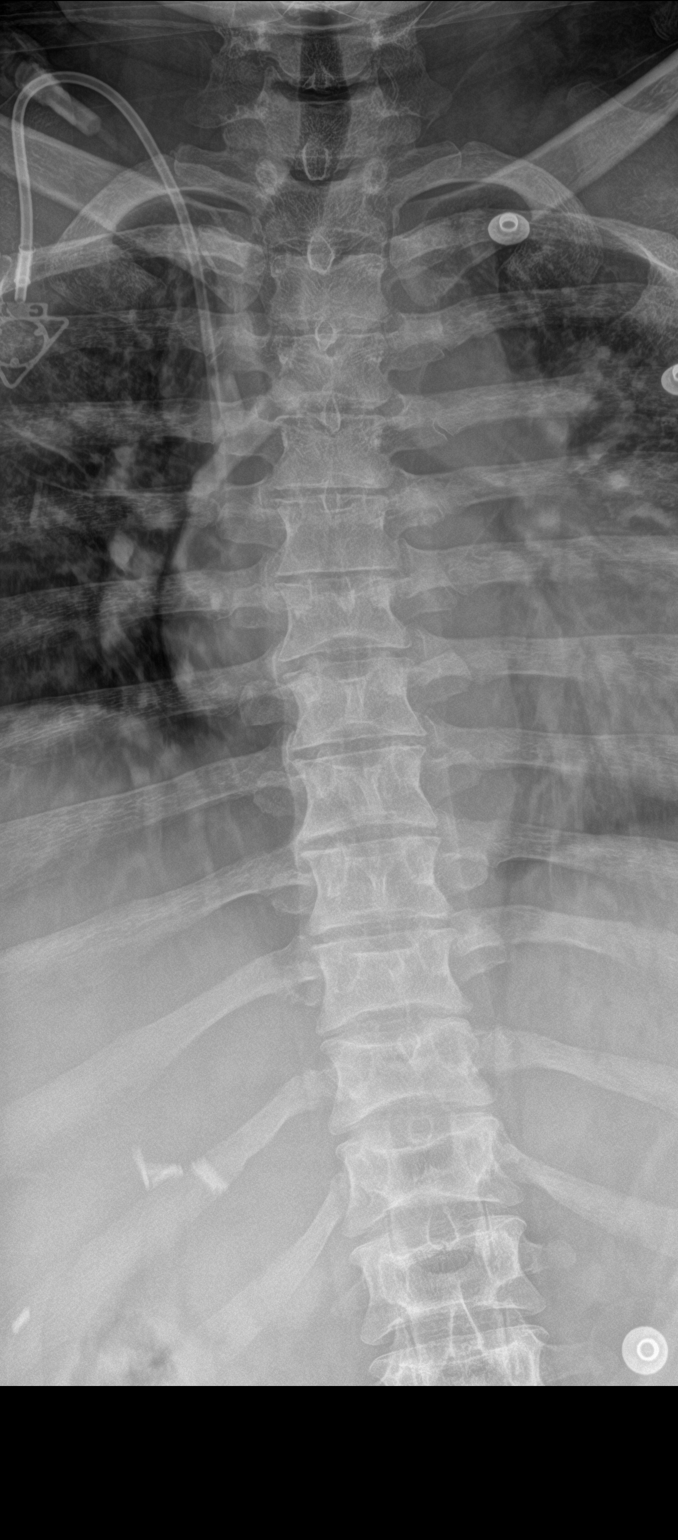

[t-spine lat]
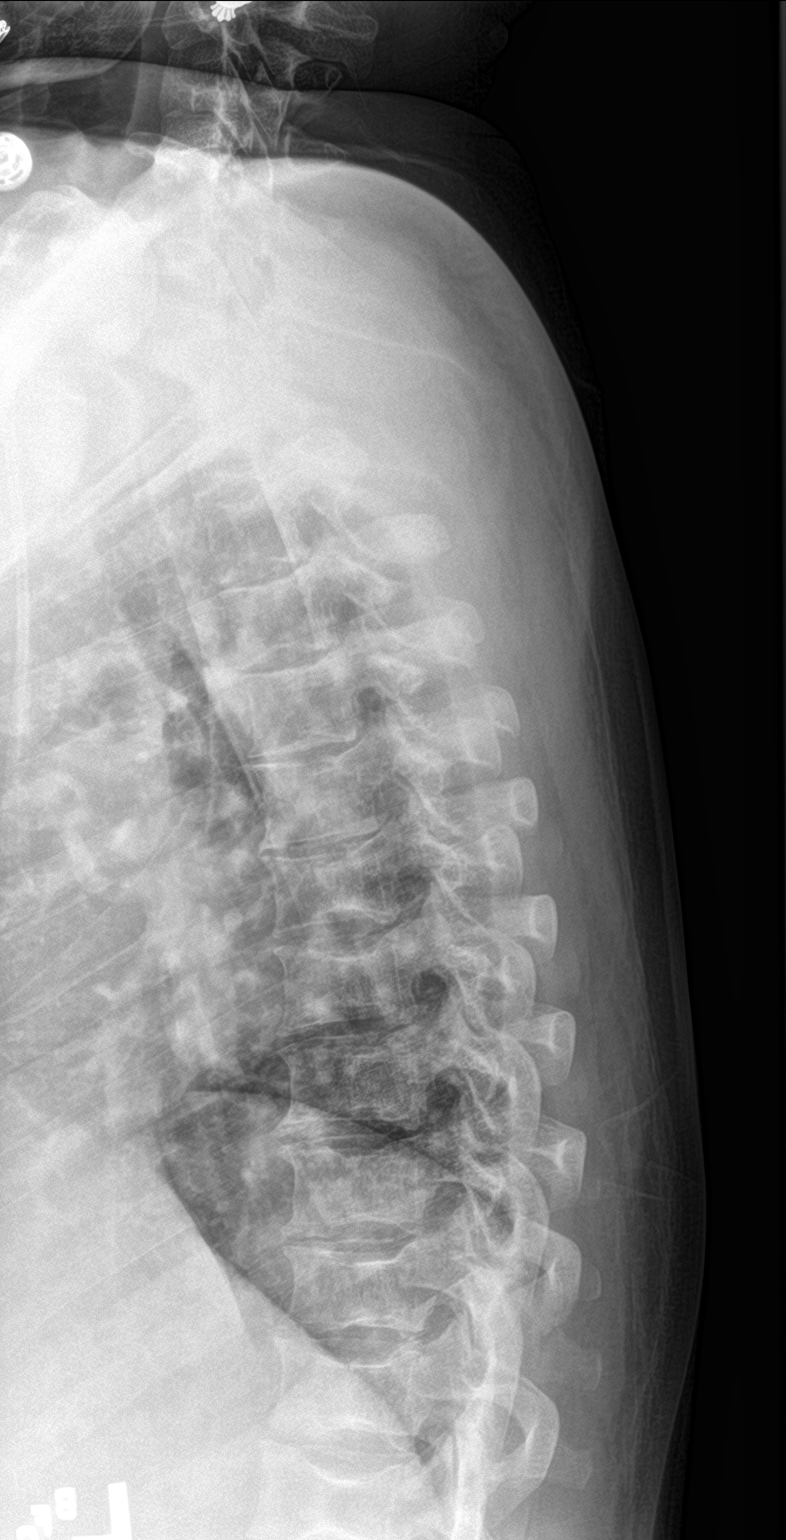

[2 of 2 positions shown; findings below may reference images not displayed]

FINDINGS: There is no evidence of thoracic spine fracture. H-shaped thoracic
vertebra consistent with history of sickle cell anemia. Alignment is
normal. No other significant bone abnormalities are identified. Port
catheter tip is seen in the lower superior vena cava.
IMPRESSION: No acute osseous abnormality of the thoracic spine. Port catheter
tip in the distal SVC. H-shaped vertebral bodies consistent with
sickle-cell.

## 2017-04-25 IMAGING — CT CT ANGIO CHEST
2 of 6 series · 17 of 36 positions shown · IV contrast (Omni 300)
Comparison: Chest radiograph dated 05/29/2016 and CT dated
02/26/2016

CLINICAL DATA: 25-year-old female with syncope. History of sickle
cell anemia.

EXAM:
CT ANGIOGRAPHY CHEST WITH CONTRAST
TECHNIQUE: Multidetector CT imaging of the chest was performed using the
standard protocol during bolus administration of intravenous
contrast. Multiplanar CT image reconstructions and MIPs were
obtained to evaluate the vascular anatomy.
CONTRAST:  48 cc Isovue 370

[Series 6: pe thins · axial · 0.63mm/px · z∈[+970,+1151]mm · 16 of 205 slices shown]
[im 12/205  lung]
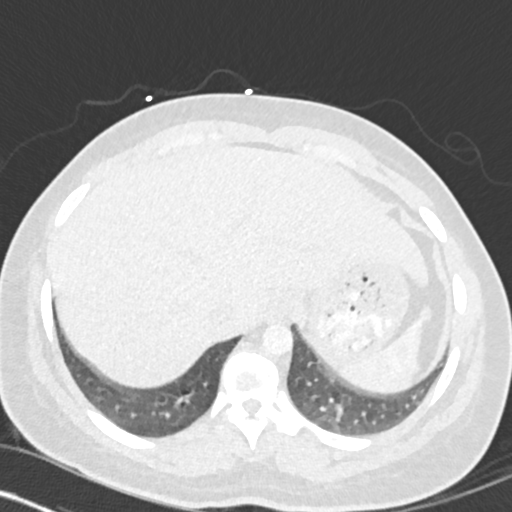
[im 23/205  mediastinal]
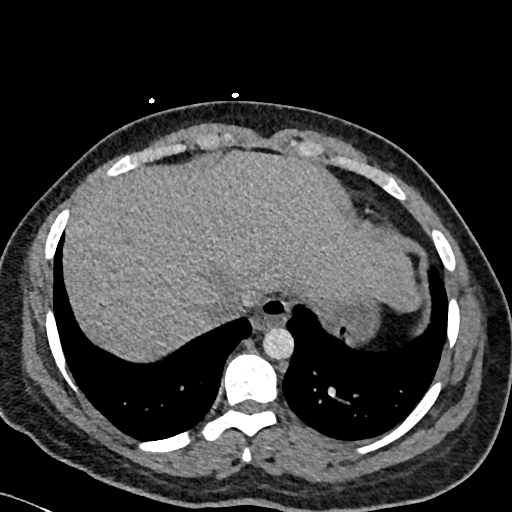
[im 35/205  lung]
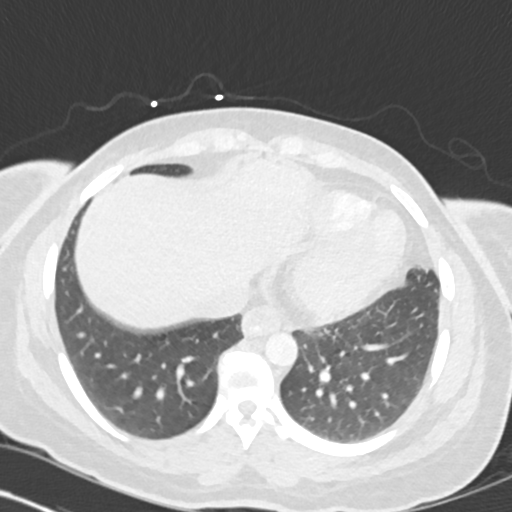
[im 46/205  mediastinal]
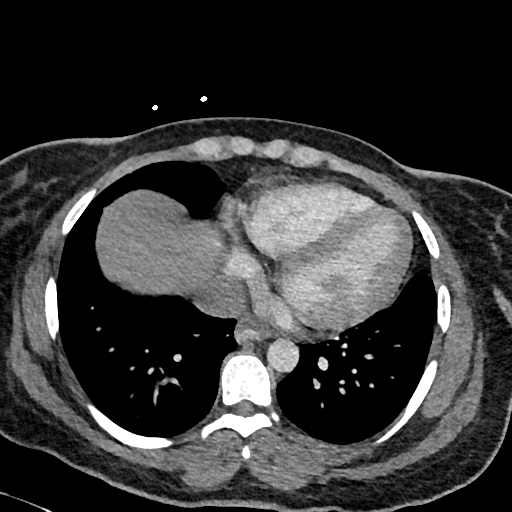
[im 57/205  lung]
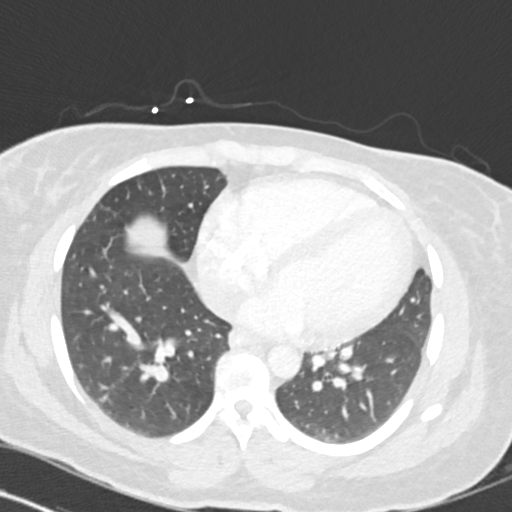
[im 69/205  mediastinal]
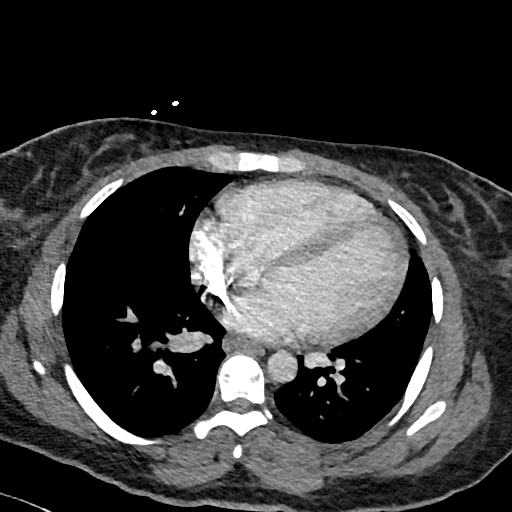
[im 80/205  lung]
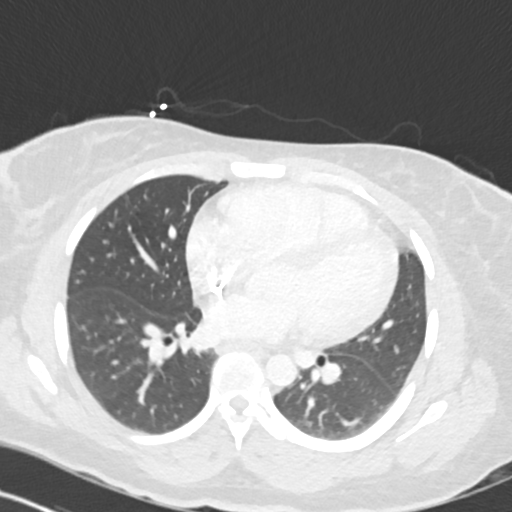
[im 91/205  mediastinal]
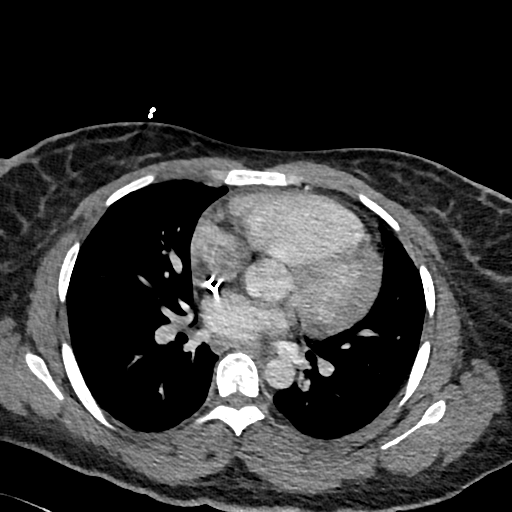
[im 114/205  lung]
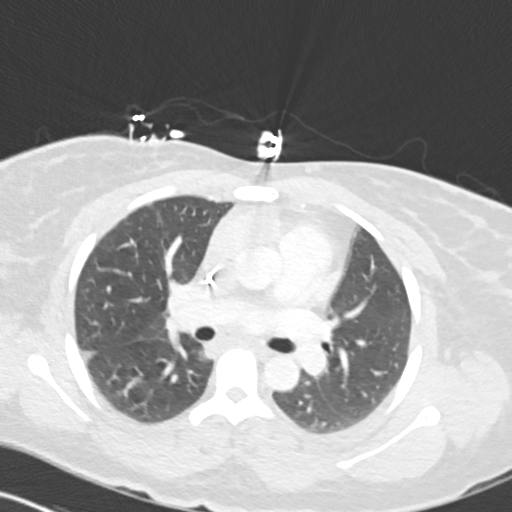
[im 125/205  mediastinal]
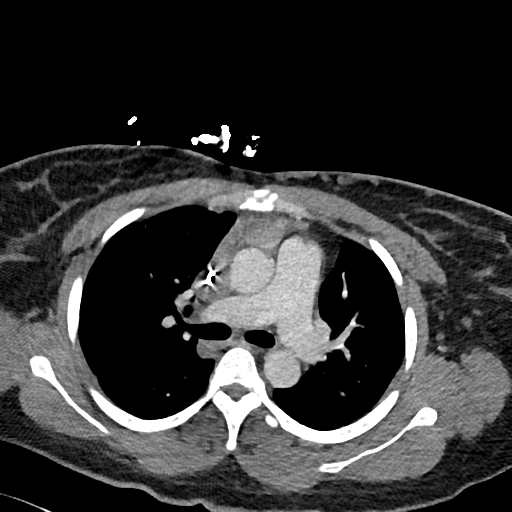
[im 137/205  lung]
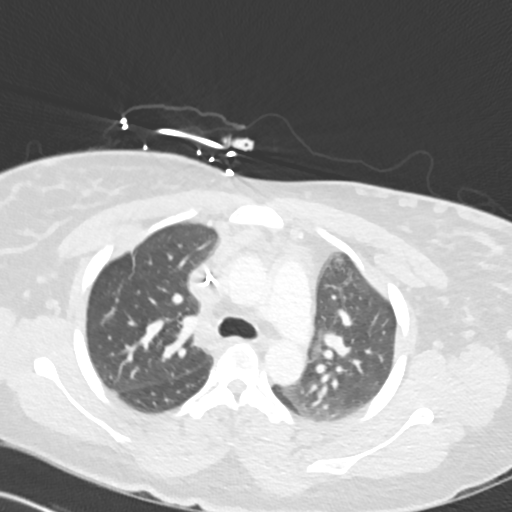
[im 148/205  mediastinal]
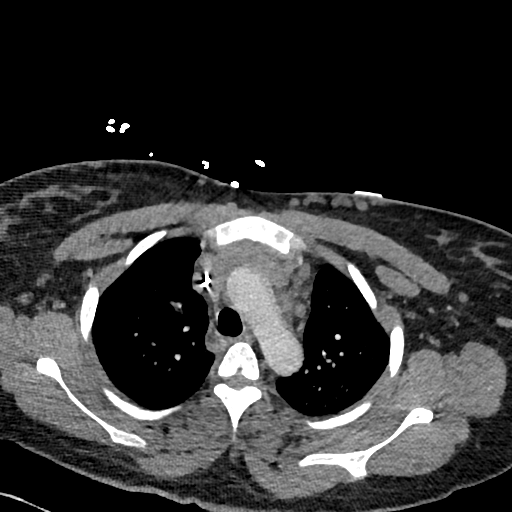
[im 159/205  lung]
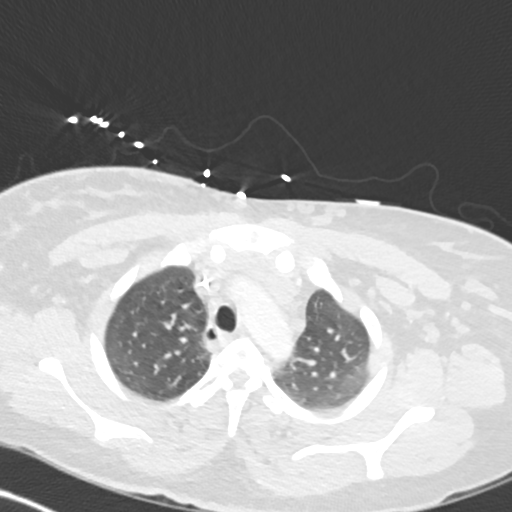
[im 171/205  mediastinal]
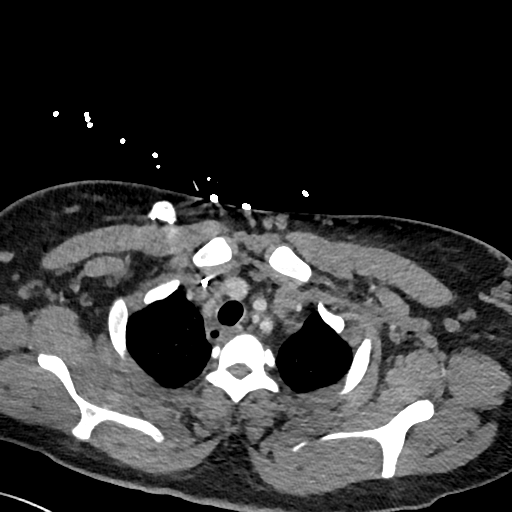
[im 182/205  lung]
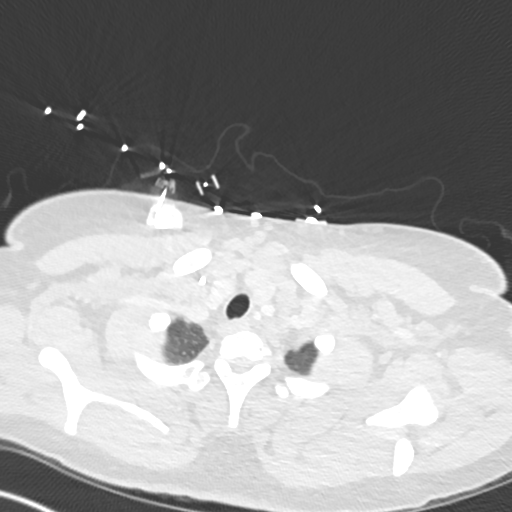
[im 193/205  mediastinal]
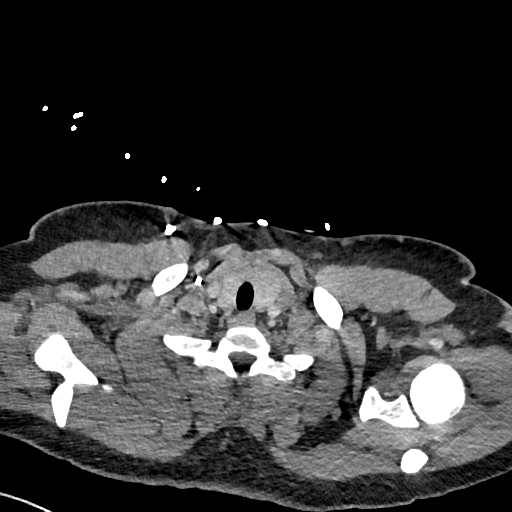

[Series 7: pe 2mm cor · coronal · 0.41mm/px · 1 of 114 slices shown]
[im 57/114  mediastinal]
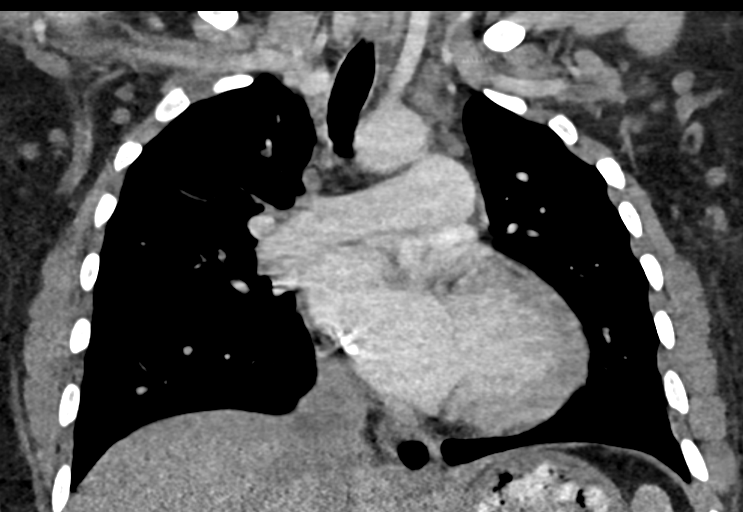

[17 of 36 positions shown; findings below may reference images not displayed]

FINDINGS: Cardiovascular: There is mild cardiomegaly with slight interval
increase in the size of the heart since the prior CT. No pericardial
effusion. Right pectoral infusion catheter with tip in the right
atrium. The thoracic aorta appears unremarkable. The origins of the
great vessels of the aortic arch appear patent. Evaluation of the
pulmonary arteries is limited due to suboptimal opacification and
timing of the contrast. Apparent intraluminal low density involving
the segmental branches of the right lower lobe (series 6 image 130)
is indeterminate and may be artifactual and related to suboptimal
enhancement of the vessel. Pulmonary embolus is not entirely
excluded. Correlation with clinical exam recommended. A V/Q scan may
provide better evaluation if there is high clinical concern for
acute pulmonary embolus.

Mediastinum/Nodes: There is no hilar or mediastinal adenopathy. The
esophagus is grossly unremarkable. No thyroid nodules identified.

Lungs/Pleura: There is a stable 6 mm nodule or 2 adjacent nodules in
the superior segment of the left lower lobe as seen on the prior CT.
Right upper lobe linear atelectasis/scarring noted. The lungs are
otherwise clear. There is no pleural effusion or pneumothorax. The
central airways are patent.

Upper Abdomen: Lobulated heterogeneous spleen similar to prior CT
likely sequela of sickle cell disease.

Musculoskeletal: There is central depression of the thoracic
vertebra sequela of sickle cell disease. No acute fracture.

Review of the MIP images confirms the above findings.
IMPRESSION: This study is essentially nondiagnostic for evaluation of pulmonary
embolism due to suboptimal opacification of the pulmonary arteries.
Intraluminal low attenuating area involving the segmental branches
of the right lower lobe may be artifactual and related to suboptimal
opacification of the these branches. Pulmonary embolus is not
entirely excluded. Correlation with clinical exam recommended. V/Q
scan may provide better evaluation if there is high clinical concern
for acute pulmonary embolus.

No focal pulmonary consolidation. Stable appearing left lower lobe
pulmonary nodule.

Mild cardiomegaly with slight interval increase in the cardiac size
since the prior CT.

Stable appearing heterogeneous and irregular spleen.

## 2017-04-25 IMAGING — CR DG LUMBAR SPINE COMPLETE 4+V
5 series · 5 of 5 positions shown · non-contrast
Comparison: None.

CLINICAL DATA: Patient fell.  Syncope.  Back pain.

EXAM:
LUMBAR SPINE - COMPLETE 4+ VIEW

[l-spine ap]
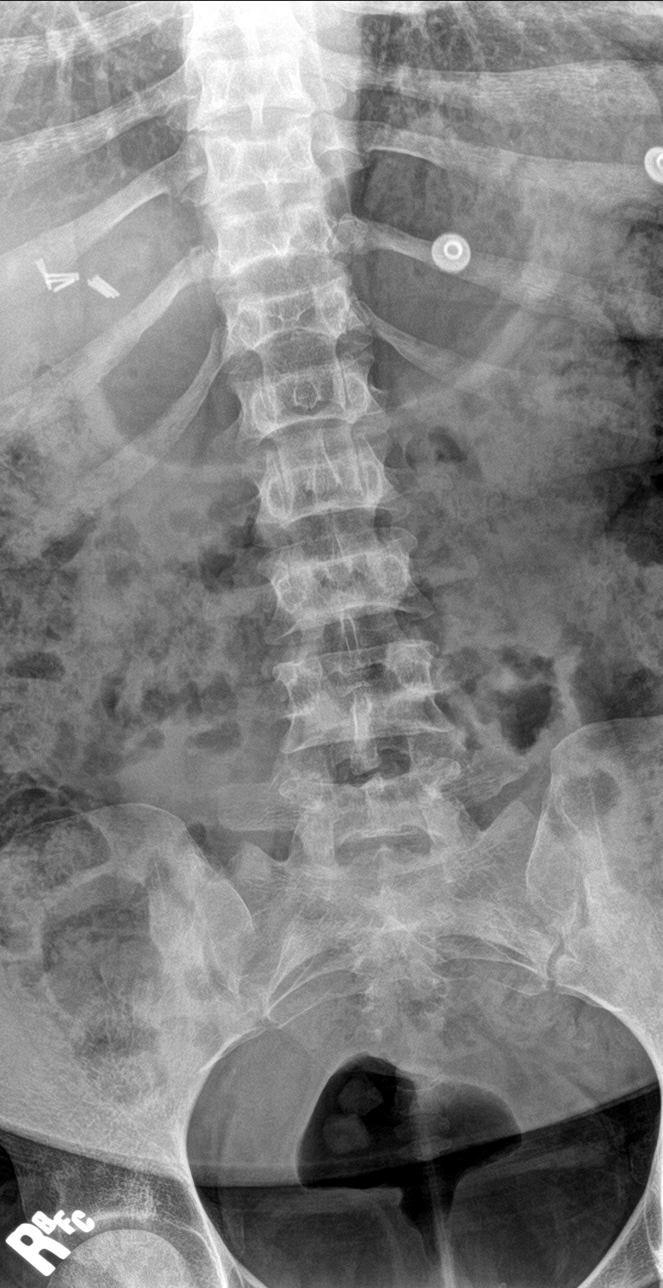

[l-spine obl (1 of 2)]
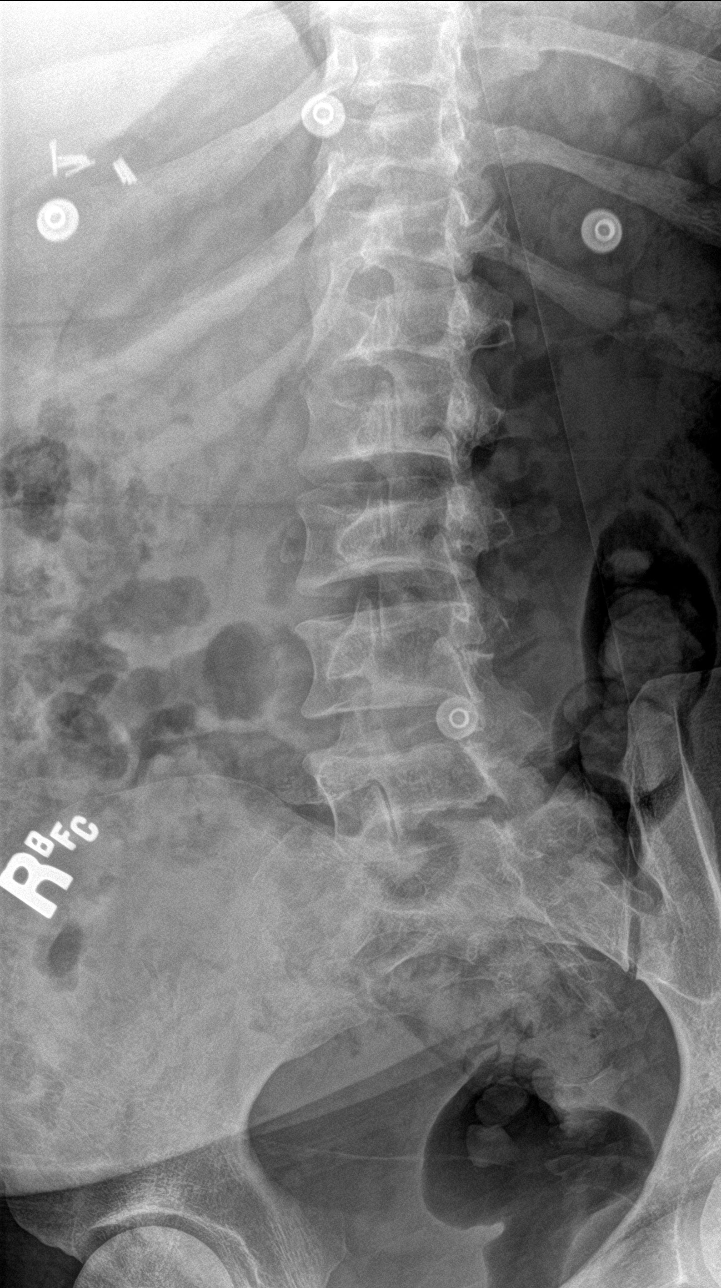

[l-spine obl (2 of 2)]
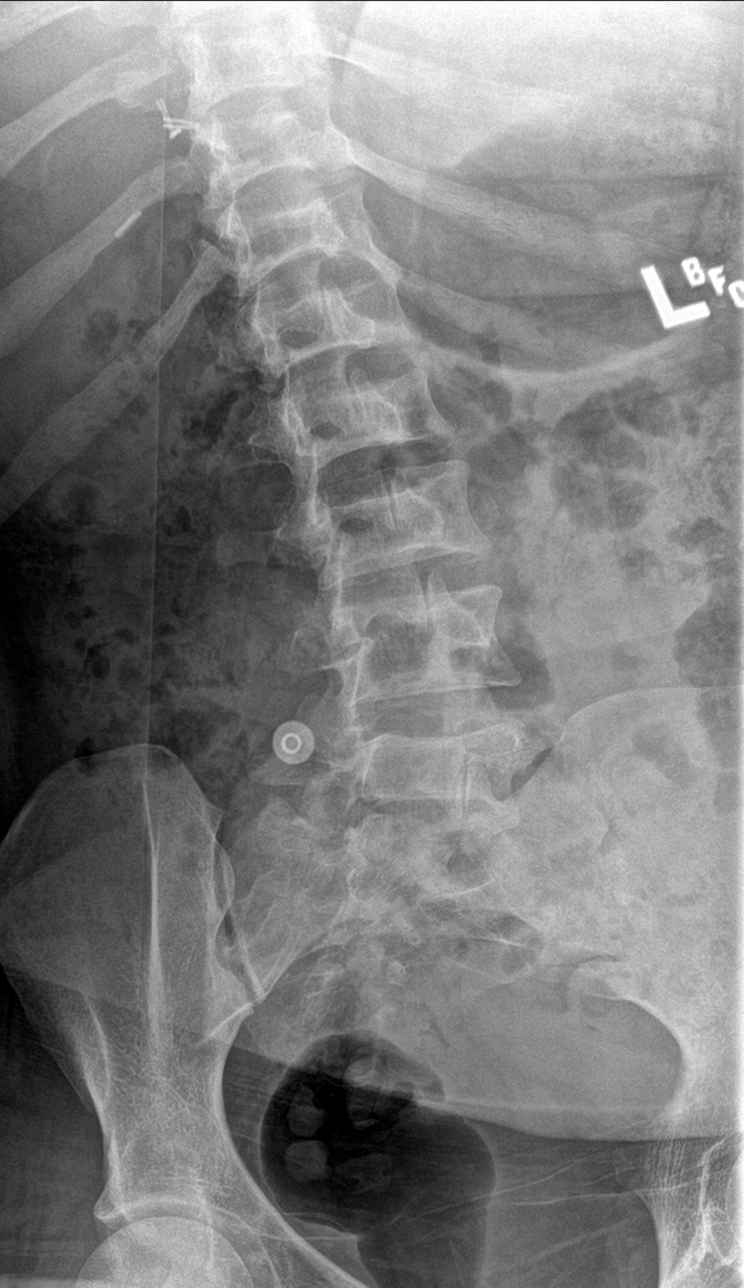

[l-spine lat]
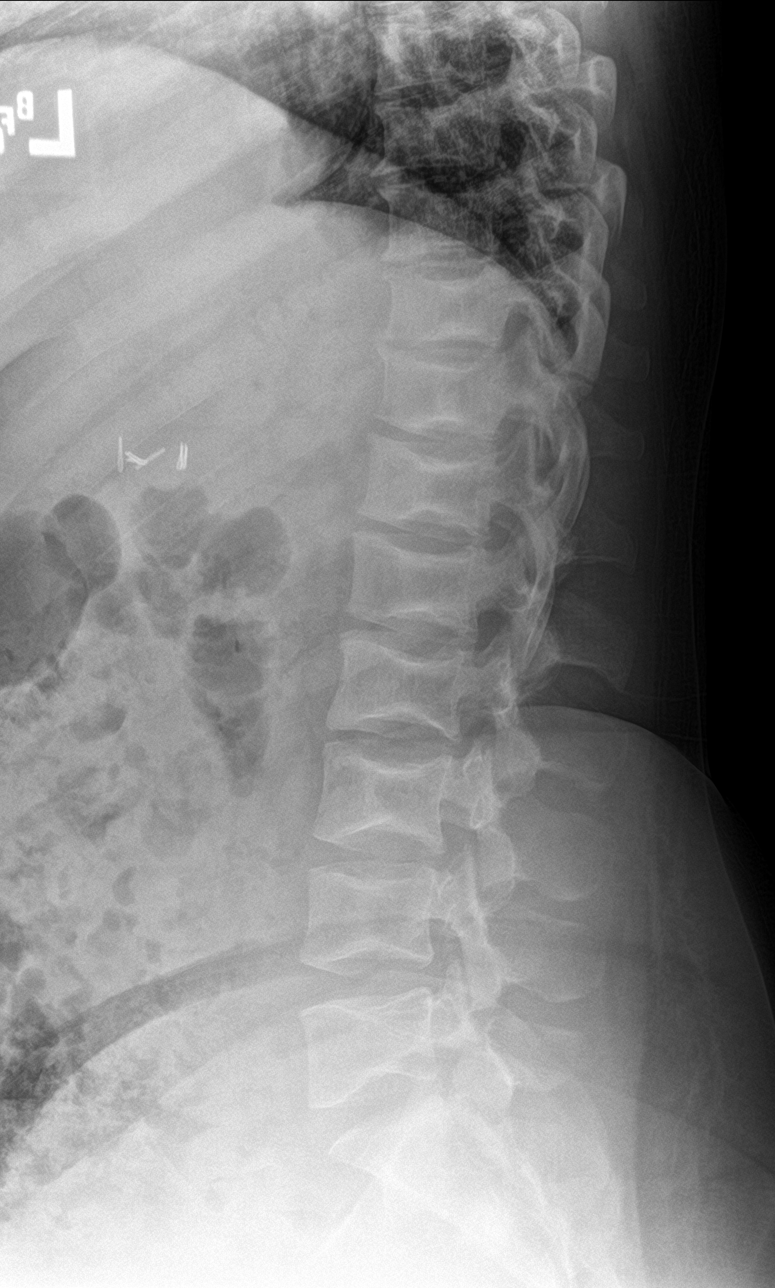

[l-spine spot]
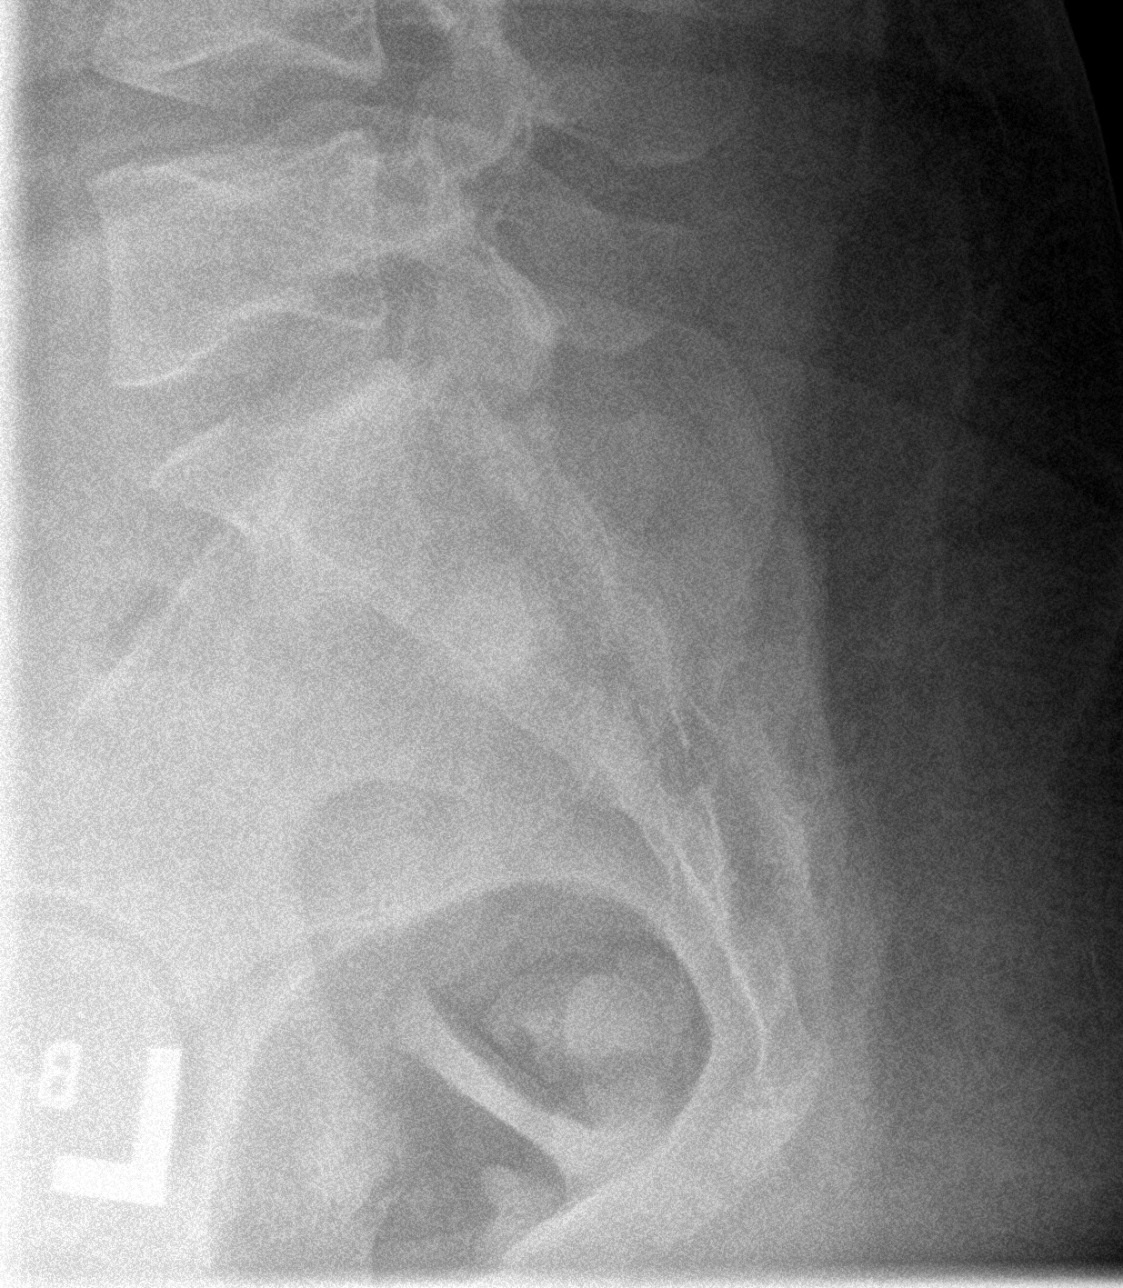

[5 of 5 positions shown; findings below may reference images not displayed]

FINDINGS: There is no evidence of lumbar spine fracture. Alignment is normal.
Intervertebral disc spaces are maintained. H-shaped vertebral bodies
throughout the visualized thoracolumbar spine would be in keeping
with vertebral body changes related to sickle cell anemia.
Cholecystectomy.
IMPRESSION: H-shaped vertebral bodies consistent with sickle cell anemia. No
acute osseous abnormality.

## 2017-04-25 IMAGING — NM NM PULMONARY VENT & PERF
12 series · 12 of 12 positions shown · non-contrast
Comparison: Chest x-ray 06/10/2016.  Chest CT 06/10/2016

CLINICAL DATA: Syncope.  Sickle cell.

EXAM:
NUCLEAR MEDICINE VENTILATION - PERFUSION LUNG SCAN
TECHNIQUE: Ventilation images were obtained in multiple projections using
inhaled aerosol Jc-33m DTPA. Perfusion images were obtained in
multiple projections after intravenous injection of Jc-33m MAA.
RADIOPHARMACEUTICALS:  30.9 mCi Technetium-GGm DTPA aerosol
inhalation and 4.1 mCi Technetium-GGm MAA IV

[Series 1: ant/post vent · 4.14mm/px · 1 of 1 slices shown (1 of 2)]
[im 1/1  full-range]
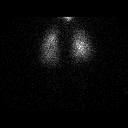

[Series 1: ant/post vent · 4.14mm/px · 1 of 1 slices shown (2 of 2)]
[im 1/1  full-range]
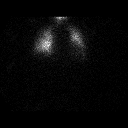

[Series 2: lao/rpo vent · 4.14mm/px · 1 of 1 slices shown (1 of 2)]
[im 1/1  full-range]
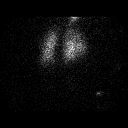

[Series 2: lao/rpo vent · 4.14mm/px · 1 of 1 slices shown (2 of 2)]
[im 1/1  full-range]
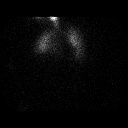

[Series 3: lpo/rao vent · 4.14mm/px · 1 of 1 slices shown (1 of 2)]
[im 1/1]
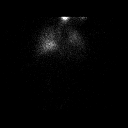

[Series 3: lpo/rao vent · 4.14mm/px · 1 of 1 slices shown (2 of 2)]
[im 1/1  full-range]
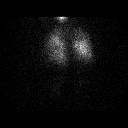

[Series 4: lpo/rao perf · 4.14mm/px · 1 of 1 slices shown (1 of 2)]
[im 1/1]
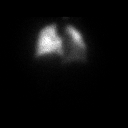

[Series 4: lpo/rao perf · 4.14mm/px · 1 of 1 slices shown (2 of 2)]
[im 1/1]
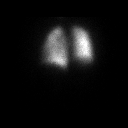

[Series 5: ant/post perf · 4.14mm/px · 1 of 1 slices shown (1 of 2)]
[im 1/1]
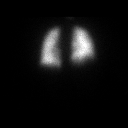

[Series 5: ant/post perf · 4.14mm/px · 1 of 1 slices shown (2 of 2)]
[im 1/1]
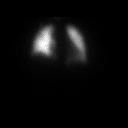

[Series 6: lao/rpo perf · 4.14mm/px · 1 of 1 slices shown (1 of 2)]
[im 1/1]
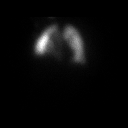

[Series 6: lao/rpo perf · 4.14mm/px · 1 of 1 slices shown (2 of 2)]
[im 1/1]
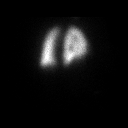

[12 of 12 positions shown; findings below may reference images not displayed]

FINDINGS: No significant focal ventilation-perfusion mismatches noted. This is
a low probability scan for pulmonary embolus .
IMPRESSION: Low probability pulmonary embolus.

## 2017-04-25 IMAGING — CT CT HEAD W/O CM
5 of 8 series · 18 of 47 positions shown, 19 images · non-contrast
Comparison: None.

CLINICAL DATA: Sickle-cell crisis and migraine. Patient fell.
Syncope.

EXAM:
CT HEAD WITHOUT CONTRAST
CT CERVICAL SPINE WITHOUT CONTRAST
TECHNIQUE: Multidetector CT imaging of the head and cervical spine was
performed following the standard protocol without intravenous
contrast. Multiplanar CT image reconstructions of the cervical spine
were also generated.

[Series 4: head without · axial · non-contrast · 0.44mm/px · z∈[-70,-10]mm · 2 of 36 slices shown, 3 images]
[im 12/36  brain]
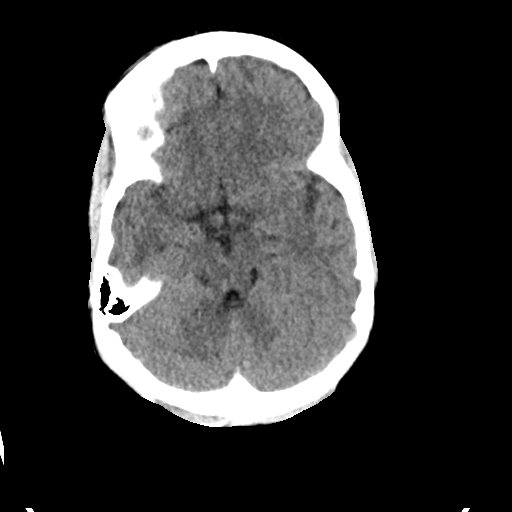
[im 12/36  bone]
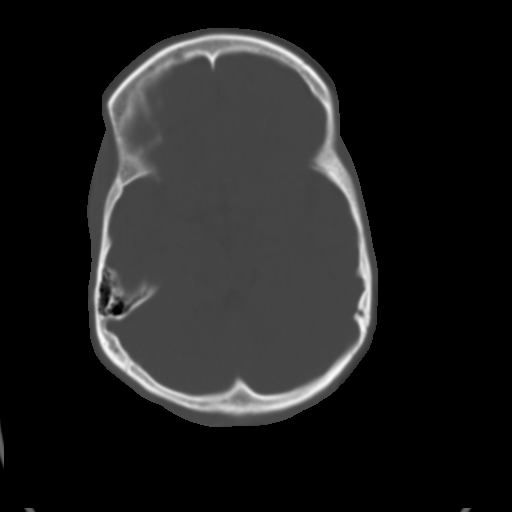
[im 24/36  brain]
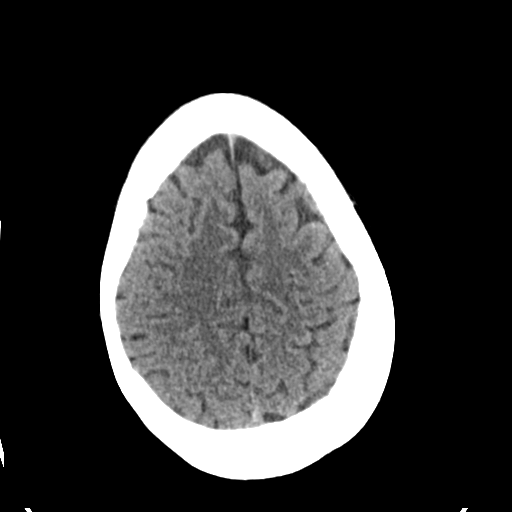

[Series 5: head bone · axial · 0.44mm/px · z∈[-103,+29]mm · 7 of 90 slices shown]
[im 12/90  bone]
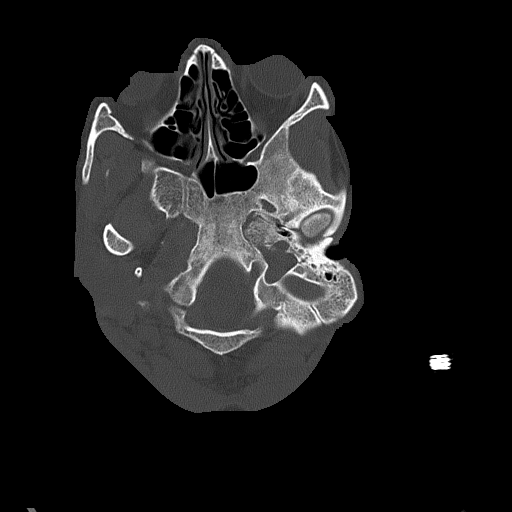
[im 23/90  bone]
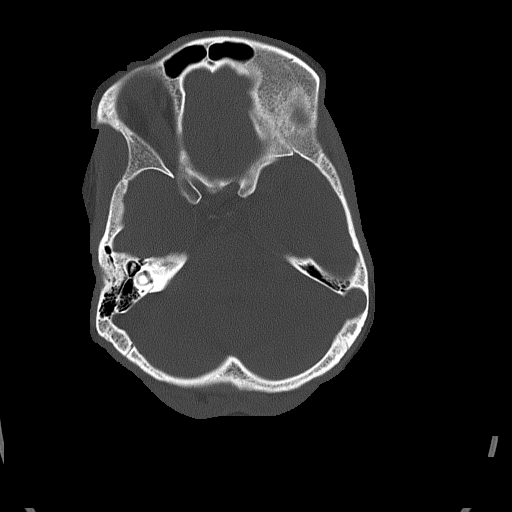
[im 34/90  bone]
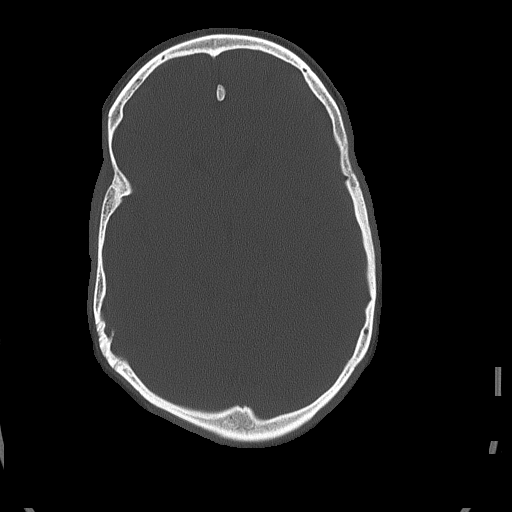
[im 45/90  bone]
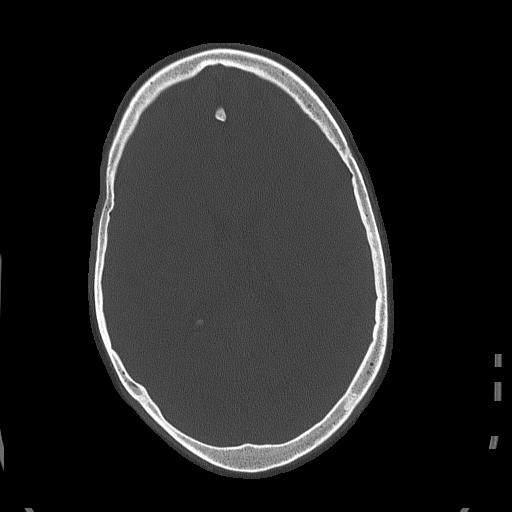
[im 56/90  bone]
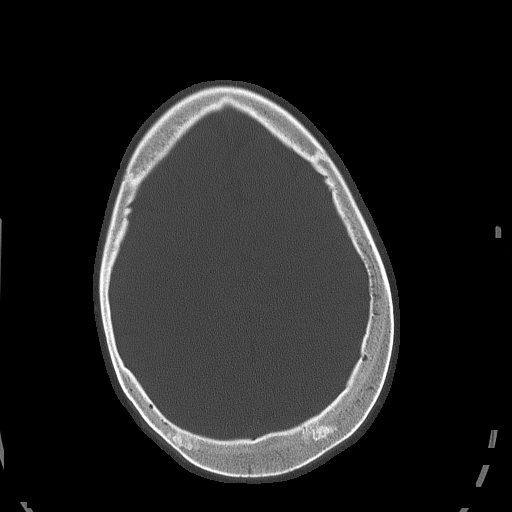
[im 67/90  bone]
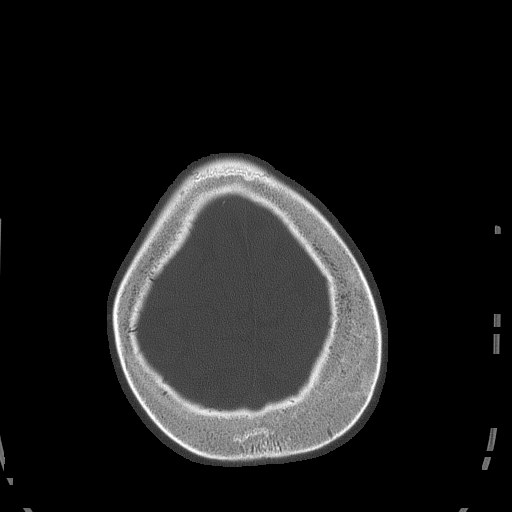
[im 78/90  bone]
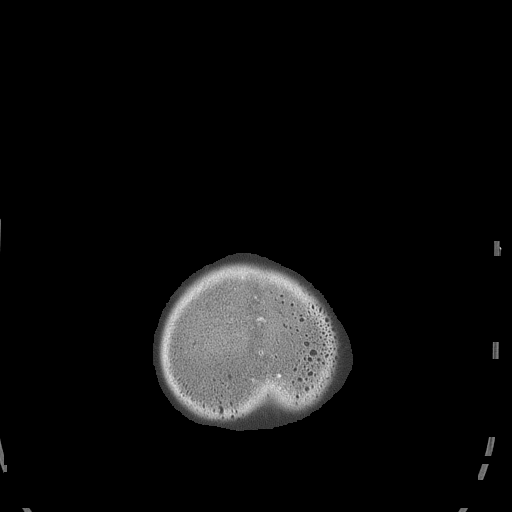

[Series 6: head without cor · coronal · non-contrast · 0.35mm/px · 3 of 71 slices shown]
[im 18/71  brain]
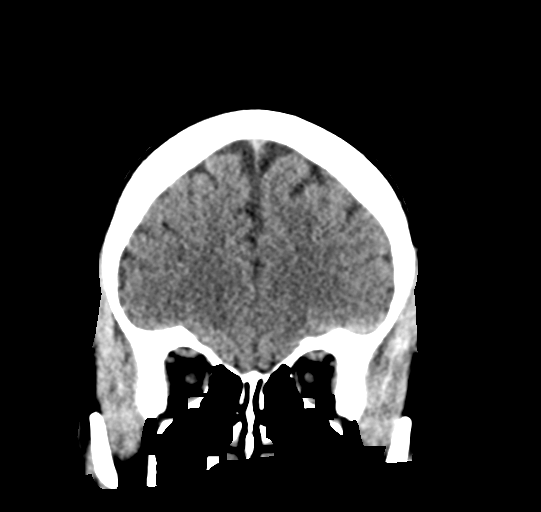
[im 36/71  brain]
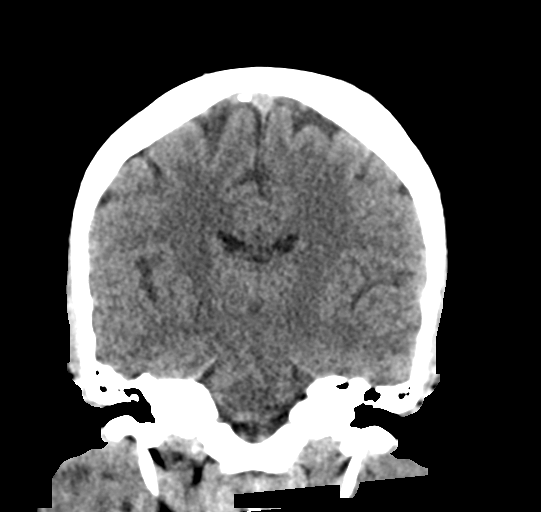
[im 53/71  brain]
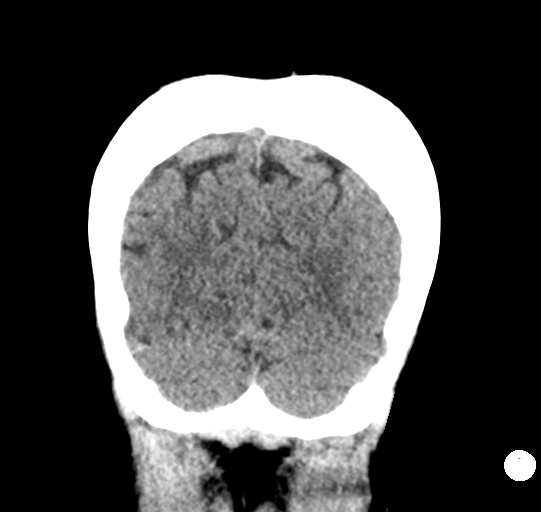

[Series 7: head without sag · sagittal · non-contrast · 0.37mm/px · 1 of 52 slices shown]
[im 26/52  brain]
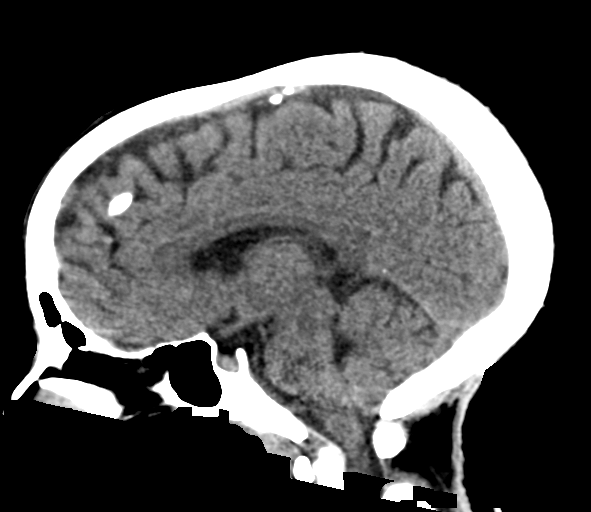

[Series 13: c_spine 2.0 orthogonals · axial · 0.21mm/px · z∈[-271,-192]mm · 5 of 84 slices shown]
[im 11/84  brain]
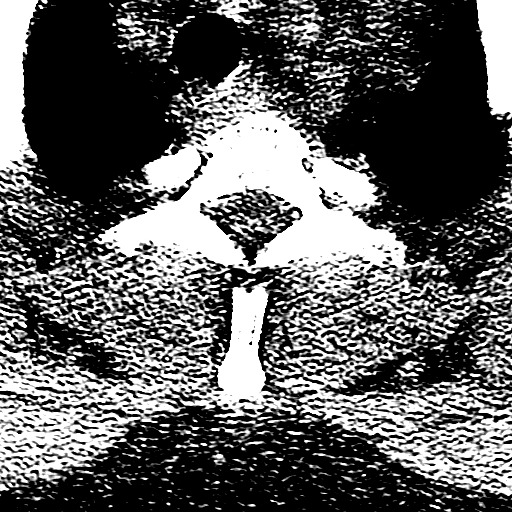
[im 21/84  brain]
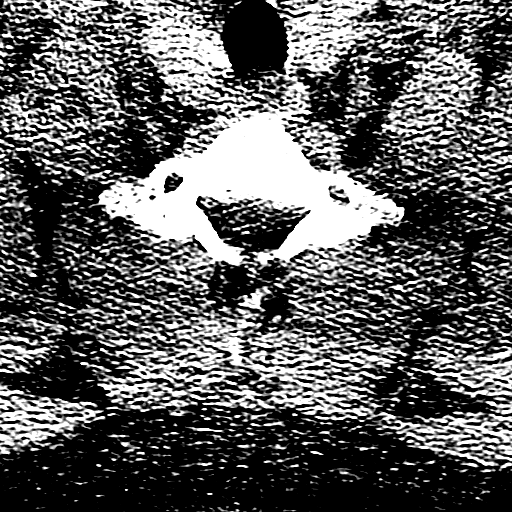
[im 32/84  brain]
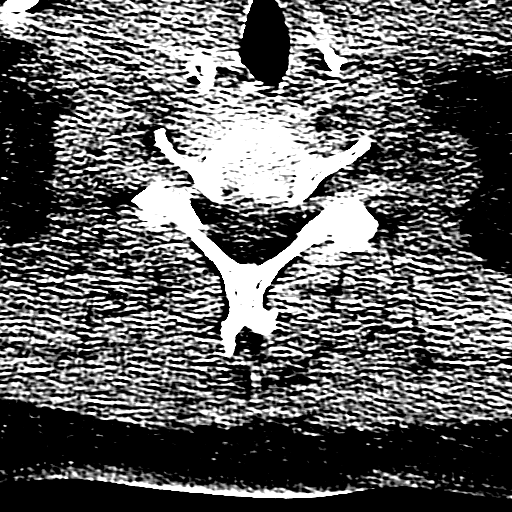
[im 42/84  brain]
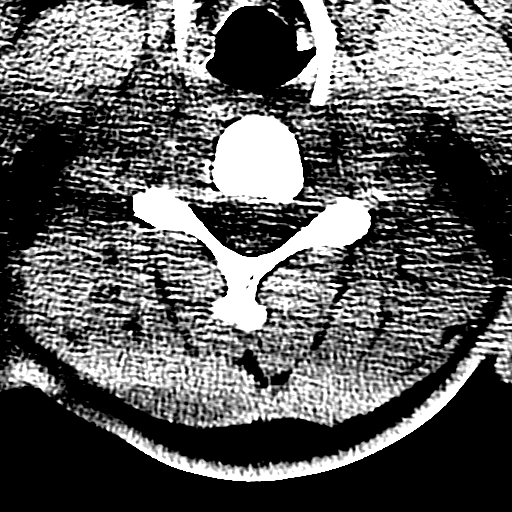
[im 52/84  brain]
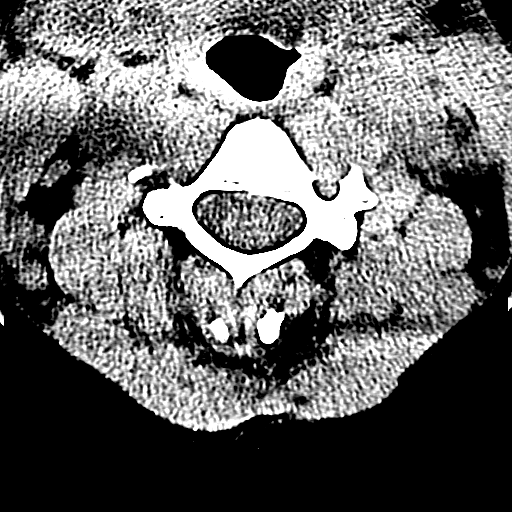

[18 of 47 positions shown; findings below may reference images not displayed]

FINDINGS: CT HEAD FINDINGS

Brain: No acute intracranial hemorrhage, large vascular territory
infarction, edema or midline shift. Ventricles and sulci are within
normal limits for age. Parafalcine calcifications are noted
anteriorly.

Vascular: No large vascular territory infarct. No hyperdense
vessels. No unexpected calcifications.

Skull: Thickened bony calvarium consistent with history of sickle
cell anemia. No acute fracture.

Sinuses/Orbits: No acute finding.

Other: None.

CT CERVICAL SPINE FINDINGS

Alignment: Normal

Skull base and vertebrae: Thickened bony calvarium consistent with
history of sickle cell. H-shaped vertebral bodies noted of the T1
and T2 vertebral bodies consistent with sickle cell.

Soft tissues and spinal canal: No prevertebral fluid or swelling. No
visible canal hematoma.

Disc levels: No focal disc herniation. No significant neural
foraminal encroachment.

Upper chest: Negative.

Other: None
IMPRESSION: Thickened bony calvarium and and H-shaped upper thoracic vertebra
consistent with sickle cell anemia.

No acute intracranial nor cervical spinal abnormality.

## 2017-04-27 ENCOUNTER — Encounter: Payer: Self-pay | Admitting: Family Medicine

## 2017-04-27 ENCOUNTER — Telehealth: Payer: Self-pay | Admitting: Family Medicine

## 2017-04-27 ENCOUNTER — Ambulatory Visit (INDEPENDENT_AMBULATORY_CARE_PROVIDER_SITE_OTHER): Payer: Medicare Other | Admitting: Family Medicine

## 2017-04-27 VITALS — BP 110/62 | HR 100 | Temp 99.2°F | Resp 14 | Ht 61.0 in | Wt 209.0 lb

## 2017-04-27 DIAGNOSIS — N926 Irregular menstruation, unspecified: Secondary | ICD-10-CM

## 2017-04-27 DIAGNOSIS — N8110 Cystocele, unspecified: Secondary | ICD-10-CM

## 2017-04-27 DIAGNOSIS — D571 Sickle-cell disease without crisis: Secondary | ICD-10-CM

## 2017-04-27 LAB — POCT URINE PREGNANCY: Preg Test, Ur: NEGATIVE

## 2017-04-27 LAB — POCT URINALYSIS DIP (DEVICE)
Bilirubin Urine: NEGATIVE
Glucose, UA: NEGATIVE mg/dL
HGB URINE DIPSTICK: NEGATIVE
KETONES UR: NEGATIVE mg/dL
Leukocytes, UA: NEGATIVE
Nitrite: NEGATIVE
PROTEIN: NEGATIVE mg/dL
SPECIFIC GRAVITY, URINE: 1.015 (ref 1.005–1.030)
Urobilinogen, UA: 0.2 mg/dL (ref 0.0–1.0)
pH: 6 (ref 5.0–8.0)

## 2017-04-27 MED ORDER — HYDROXYUREA 500 MG PO CAPS: 500.0000 mg | ORAL_CAPSULE | Freq: Two times a day (BID) | ORAL | 3 refills | Status: DC

## 2017-04-27 MED ORDER — FOLIC ACID 1 MG PO TABS: 1.0000 mg | ORAL_TABLET | Freq: Every day | ORAL | 2 refills | Status: DC

## 2017-04-27 MED ORDER — RIVAROXABAN 20 MG PO TABS: 20.0000 mg | ORAL_TABLET | Freq: Every day | ORAL | 3 refills | Status: DC

## 2017-04-27 NOTE — Telephone Encounter (Signed)
Office note completed.  Please process referral to pain management, gynecology, and hematology at [facility].  [name]. Kenton Kingfisher, MSN, FNP-C [facility]  [name] [place], [place] [address] [phone]

## 2017-04-27 NOTE — Progress Notes (Signed)
Patient ID: [name], female    DOB: [birth year], 26 y.o.   MRN: [id number]  PCP: Scot Jun, FNP  Chief Complaint  Patient presents with  . Establish Care    Subjective:  HPI [name] is a 26 y.o. female with sickle cell anemia, history of DVT, chronic anticoagulation, chronic opioid deprence presents to establish care for primary care services.    Sickle Cell Disease and Chronic Pain Charlise is a former patient of Dr. Ricke Hey, however continues to see him for pain management services only until she can be referred to new pain management provider. She has a longstanding history of multiple ED visits related to sickle cell crisis pain and admissions. At present, she has had 10 emergency department visits and 7 inpatient admissions within the last 6 months.  Most recent admission 04/01/2017 for an acute sickle crisis. Hospital stay included pain management and was complicated. Hemoglobin at discharge 9.2, stable. Khrystyna reports that she has been out of hydroxyurea and Xarelto for two weeks, as her pain management provider refused to refill medications. Requesting refills of medications today. Reports that her pain has been well controlled for over the last few weeks. She reports recently detoxing last month as she had developed an opioid dependency over the course of several years. Reports that she decided to detox to be a better support system for her two children. For this reason, she has voluntarily reduced her oxycodone 10 mg as needed and no longer takes medications around the clock., only if pain is severe an unmanageable. [name] wishes to have her pain managed by a new provider. She also requests a referral for [facility] sickle cell clinic for hematological specialty care. At present, [name] denies active chronic pain, chest pain, headaches, dizziness, shortness of breath, dysuria, or fever.  Irregular vaginal bleeding  [name]  reports over the course of  one month, she has actively experienced bleeding with the consistency of a regular menstrual period. She is currently not sexually active. She received last Depo Shot during her most recent hospitalization in November. In spite of injection, she has continues to experience bleeding. She is also concerned, about vaginal prolapse she developed post partum that she is has been unable to schedule a gynecological visit to follow-up on. She denies pelvic pain,  fatigue or dizziness. No prior history of fibroids, endometriosis, or abnormal pap.  Social History   Socioeconomic History  . Marital status: Single    Spouse name: Not on file  . Number of children: Not on file  . Years of education: Not on file  . Highest education level: Not on file  Social Needs  . Financial resource strain: Not on file  . Food insecurity - worry: Not on file  . Food insecurity - inability: Not on file  . Transportation needs - medical: Not on file  . Transportation needs - non-medical: Not on file  Occupational History  . Not on file  Tobacco Use  . Smoking status: Former Smoker    Packs/day: 0.25    Years: 1.00    Pack years: 0.25    Types: Cigarettes    Last attempt to quit: 03/25/2013    Years since quitting: 4.0  . Smokeless tobacco: Never Used  Substance and Sexual Activity  . Alcohol use: No    Comment: denies  . Drug use: No    Comment: "Clean for 3 months"  . Sexual activity: Yes    Partners: Male  Birth control/protection: None    Comment: last sex Jul 11 2015  Other Topics Concern  . Not on file  Social History Narrative   Lives  With mother   FOB is supportive-supposed to be moving next month   Is a student at [employer] time    Family History  Problem Relation Age of Onset  . Sickle cell trait Mother   . Sickle cell trait Father   . Diabetes Maternal Grandmother   . Diabetes Paternal Grandmother   . Hypertension Paternal Grandmother   . Diabetes Maternal Grandfather    Review of  Systems  Constitutional: Negative.   HENT: Negative.   Respiratory: Negative.   Cardiovascular: Negative.   Gastrointestinal: Negative.   Genitourinary: Positive for menstrual problem and vaginal bleeding. Negative for vaginal pain.  Neurological: Negative.   Psychiatric/Behavioral: Negative.        History of addiction       Patient Active Problem List   Diagnosis Date Noted  . Leukocytosis   . Chronic anticoagulation   . Sickle cell anemia ([place]) 05/19/2016  . History of DVT (deep vein thrombosis) 04/17/2016  . Thrombocytosis ([place]) 11/09/2015  . Hyperbilirubinemia 11/09/2015  . Chronic pain 08/04/2015  . Herpes simplex 07/14/2015  . Anemia of chronic disease   . 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 Food Hives and Other (See Comments)    Pt is allergic to carrots.    . Latex Rash    Prior to Admission medications   Medication Sig Start Date End Date Taking? Authorizing Provider  acetaminophen (TYLENOL) 500 MG tablet Take 1,000 mg every 6 (six) hours as needed by mouth for moderate pain.   Yes [provider]  aspirin-acetaminophen-caffeine (EXCEDRIN MIGRAINE) [phone] MG tablet Take 2 tablets every 6 (six) hours as needed by mouth for headache.   Yes [provider]  Cyanocobalamin-Caffeine (ENERGY CHEWS PO) Take 2 tablets daily by mouth.   Yes [provider]  hydroxyurea (HYDREA) 500 MG capsule Take 2 capsules (1,000 mg total) by mouth daily. May take with food to minimize GI side effects. Patient taking differently: Take 500 mg 2 (two) times daily by mouth. May take with food to minimize GI side effects. 04/18/16  Yes Tresa Garter, MD  medroxyPROGESTERone (DEPO-PROVERA) 150 MG/ML injection Inject 1 mL (150 mg total) into the muscle every 3 (three) months. 09/21/16  Yes Shelly Bombard, MD  morphine (MS CONTIN) 60 MG 12  hr tablet Take 60 mg by mouth every 12 (twelve) hours as needed for pain.    Yes [provider]  Multiple Vitamin (MULTIVITAMIN WITH MINERALS) TABS tablet Take 1 tablet daily by mouth.   Yes [provider]  Multiple Vitamins-Minerals (HAIR SKIN AND NAILS FORMULA PO) Take 2 tablets daily by mouth.   Yes [provider]  Oxycodone HCl 20 MG TABS Take 20 mg every 4 (four) hours as needed by mouth (pain).  01/17/17  Yes [provider]  rivaroxaban (XARELTO) 20 MG TABS tablet Take 20 mg by mouth daily with supper.    Yes [provider]    Past Medical, Surgical Family and Social History reviewed and updated.    Objective:   Today's Vitals   04/27/17 0855  BP: 110/62  Pulse: 100  Resp: 14  Temp: 99.2 F (37.3 C)  TempSrc: Oral  SpO2: 98%  Weight: 209 lb (  94.8 kg)  Height: 5\' 1"  (1.549 m)    Wt Readings from Last 3 Encounters:  04/27/17 209 lb (94.8 kg)  04/01/17 204 lb 12.9 oz (92.9 kg)  03/04/17 214 lb 14.4 oz (97.5 kg)    Physical Exam Physical Exam: Constitutional: Patient appears well-developed and well-nourished. No distress. HENT: Normocephalic, atraumatic, External right and left ear normal. Oropharynx is clear and moist.  Eyes: Conjunctivae and EOM are normal. PERRLA, no scleral icterus. Neck: Normal ROM. Neck supple. No JVD. No tracheal deviation. No thyromegaly. CVS: RRR, S1/S2 +, no murmurs, no gallops, no carotid bruit.  Pulmonary: Effort and breath sounds normal, no stridor, rhonchi, wheezes, rales.  Abdominal: Soft. BS +, no distension, tenderness, rebound or guarding.  Musculoskeletal: Normal range of motion. No edema and no tenderness.  Lymphadenopathy: No lymphadenopathy noted, cervical, inguinal or axillary Neuro: Alert. Normal reflexes, muscle tone coordination. No cranial nerve deficit. Skin: Skin is warm and dry. No rash noted. Not diaphoretic. No erythema. No pallor. Psychiatric: Normal mood and affect.  Behavior, judgment, thought content normal.  Assessment & Plan:  1. Hb-SS disease without crisis ([place]), controlled. Continue Hydrea. We discussed the need for good hydration, monitoring of hydration status, avoidance of heat, cold, stress, and infection triggers. We discussed the risks and benefits of Hydrea, including bone marrow suppression, the possibility of GI upset, skin ulcers, hair thinning, and teratogenicity. The patient was reminded of the need to seek medical attention for any symptoms of bleeding, anemia, or infection. Continue folic acid 1 mg daily to prevent aplastic bone marrow crises.  Education materials provided explaining the effectiveness and indication of use for Endari. We will discuss consideration for adding this medication at next office visit.  Referring to hematologist at [facility], [place].  Referring pain management , [address] , [place], [place] Dr. Rochele Pages.    Pulmonary evaluation - Patient denies severe recurrent wheezes, shortness of breath with exercise, or persistent cough. If these symptoms develop, pulmonary function tests with spirometry will be ordered, and if abnormal, plan on referral to Pulmonology for further evaluation.  Eye - High risk of proliferative retinopathy. Annual eye exam with retinal exam recommended to patient, the patient has had eye exam this year.  Immunization status - Yearly influenza vaccination is recommended, as well as being up to date with Meningococcal and Pneumococcal vaccines.   2. Irregular menses, unknown cause of irregular menses.  Will obtain an transvaginal and pelvic ultrasound.  Patient recently received a Depo injection and has had irregular bleeding since that time.  I will also refer her to [facility] gynecological services for further evaluation. Urine pregnancy is negative.  3. Vaginal Prolapse, exam deferred today, as patient actively bleeding. Obtaining transvaginal and pelvic  ultrasound for irregular bleeding, will defer evaluation and management of prolapse to gynecological services.   Meds ordered this encounter  Medications  . rivaroxaban (XARELTO) 20 MG TABS tablet    Sig: Take 1 tablet (20 mg total) by mouth daily with supper.    Dispense:  30 tablet    Refill:  3    Order Specific Question:   Supervising Provider    Answer:   Tresa Garter [id number]  . hydroxyurea (HYDREA) 500 MG capsule    Sig: Take 1 capsule (500 mg total) by mouth 2 (two) times daily. May take with food to minimize GI side effects.    Dispense:  60 capsule    Refill:  3  Order Specific Question:   Supervising Provider    Answer:   Tresa Garter [id number]  . folic acid (FOLVITE) 1 MG tablet    Sig: Take 1 tablet (1 mg total) by mouth daily.    Dispense:  90 tablet    Refill:  2    Order Specific Question:   Supervising Provider    Answer:   Tresa Garter [id number]     Orders Placed This Encounter  Procedures  . US Transvaginal Non-OB  . US PELVIS (TRANSABDOMINAL ONLY)  . CBC with Differential  . Reticulocytes  . Comprehensive metabolic panel  . POCT urine pregnancy  . POCT urinalysis dip (device)     Return in about 4 weeks for Sickle Cell Disease/Pain.  The patient was given clear instructions to go to ER or return to medical center if symptoms don't improve, worsen or new problems develop. The patient verbalized understanding. The patient was told to call to get lab results if they haven't heard anything in the next week.      Carroll Sage. Kenton Kingfisher, MSN, FNP-C [facility]  [name] [place], [place] [address] [phone]

## 2017-04-27 NOTE — Patient Instructions (Signed)
You will be contacted to schedule appointment for the specialist you are being referred to.   Medications are available for pick-up at your pharmacy.   You will be contacted once your vaginal Allena Earing is scheduled.    Sickle Cell Anemia, Adult Sickle cell anemia is a condition where your red blood cells are shaped like sickles. Red blood cells carry oxygen through the body. Sickle-shaped red blood cells do not live as long as normal red blood cells. They also clump together and block blood from flowing through the blood vessels. These things prevent the body from getting enough oxygen. Sickle cell anemia causes organ damage and pain. It also increases the risk of infection. Follow these instructions at home:  Drink enough fluid to keep your pee (urine) clear or pale yellow. Drink more in hot weather and during exercise.  Do not smoke. Smoking lowers oxygen levels in the blood.  Only take over-the-counter or prescription medicines as told by your doctor.  Take antibiotic medicines as told by your doctor. Make sure you finish them even if you start to feel better.  Take supplements as told by your doctor.  Consider wearing a medical alert bracelet. This tells anyone caring for you in an emergency of your condition.  When traveling, keep your medical information, doctors' names, and the medicines you take with you at all times.  If you have a fever, do not take fever medicines right away. This could cover up a problem. Tell your doctor.  Keep all follow-up visits with your doctor. Sickle cell anemia requires regular medical care. Contact a doctor if: You have a fever. Get help right away if:  You feel dizzy or faint.  You have new belly (abdominal) pain, especially on the left side near the stomach area.  You have a lasting, often uncomfortable and painful erection of the penis (priapism). If it is not treated right away, you will become unable to have sex (impotence).  You  have numbness in your arms or legs or you have a hard time moving them.  You have a hard time talking.  You have a fever or lasting symptoms for more than 2-3 days.  You have a fever and your symptoms suddenly get worse.  You have signs or symptoms of infection. These include: ? Chills. ? Being more tired than normal (lethargy). ? Irritability. ? Poor eating. ? Throwing up (vomiting).  You have pain that is not helped with medicine.  You have shortness of breath.  You have pain in your chest.  You are coughing up pus-like or bloody mucus.  You have a stiff neck.  Your feet or hands swell or have pain.  Your belly looks bloated.  Your joints hurt. This information is not intended to replace advice given to you by your health care provider. Make sure you discuss any questions you have with your health care provider. Document Released: 02/20/2013 Document Revised: 10/08/2015 Document Reviewed: 12/12/2012 Elsevier Interactive Patient Education  2017 [employer].

## 2017-04-28 LAB — CBC WITH DIFFERENTIAL/PLATELET
BASOS ABS: 0 10*3/uL (ref 0.0–0.2)
Basos: 0 %
EOS (ABSOLUTE): 0.3 10*3/uL (ref 0.0–0.4)
Eos: 2 %
HEMOGLOBIN: 8.6 g/dL — AB (ref 11.1–15.9)
Hematocrit: 26.1 % — ABNORMAL LOW (ref 34.0–46.6)
Immature Grans (Abs): 0 10*3/uL (ref 0.0–0.1)
Immature Granulocytes: 0 %
LYMPHS ABS: 1.6 10*3/uL (ref 0.7–3.1)
LYMPHS: 13 %
MCH: 29.6 pg (ref 26.6–33.0)
MCHC: 33 g/dL (ref 31.5–35.7)
MCV: 90 fL (ref 79–97)
MONOCYTES: 10 %
Monocytes Absolute: 1.2 10*3/uL — ABNORMAL HIGH (ref 0.1–0.9)
Neutrophils Absolute: 8.8 10*3/uL — ABNORMAL HIGH (ref 1.4–7.0)
Neutrophils: 75 %
PLATELETS: 596 10*3/uL — AB (ref 150–379)
RBC: 2.91 x10E6/uL — AB (ref 3.77–5.28)
RDW: 20.6 % — ABNORMAL HIGH (ref 12.3–15.4)
WBC: 12 10*3/uL — AB (ref 3.4–10.8)

## 2017-04-28 LAB — COMPREHENSIVE METABOLIC PANEL
A/G RATIO: 1.5 (ref 1.2–2.2)
ALK PHOS: 70 IU/L (ref 39–117)
ALT: 13 IU/L (ref 0–32)
AST: 18 IU/L (ref 0–40)
Albumin: 4.3 g/dL (ref 3.5–5.5)
BILIRUBIN TOTAL: 1.9 mg/dL — AB (ref 0.0–1.2)
BUN/Creatinine Ratio: 9 (ref 9–23)
BUN: 6 mg/dL (ref 6–20)
CHLORIDE: 106 mmol/L (ref 96–106)
CO2: 20 mmol/L (ref 20–29)
Calcium: 9.2 mg/dL (ref 8.7–10.2)
Creatinine, Ser: 0.68 mg/dL (ref 0.57–1.00)
GFR calc non Af Amer: 122 mL/min/{1.73_m2} (ref >59)
GFR, EST AFRICAN AMERICAN: 141 mL/min/{1.73_m2} (ref >59)
GLUCOSE: 96 mg/dL (ref 65–99)
Globulin, Total: 2.8 g/dL (ref 1.5–4.5)
POTASSIUM: 4.5 mmol/L (ref 3.5–5.2)
Sodium: 140 mmol/L (ref 134–144)
TOTAL PROTEIN: 7.1 g/dL (ref 6.0–8.5)

## 2017-04-28 LAB — RETICULOCYTES: RETIC CT PCT: 19.2 % — AB (ref 0.6–2.6)

## 2017-04-28 IMAGING — MR MR MRA HEAD W/O CM
2 series · 17 of 48 positions shown · non-contrast
Comparison: 06/10/2016 CT head.

CLINICAL DATA: 25 y/o  F; sickle cell pain crisis with syncope.

EXAM:
MRA HEAD WITHOUT CONTRAST
TECHNIQUE: Angiographic images of the Circle of Willis were obtained using MRA
technique without intravenous contrast.

[Series 3: (id) mt fs · axial · 1.4mm · 0.39mm/px · z∈[-95,+93]mm · 16 of 206 slices shown]
[im 1/206]
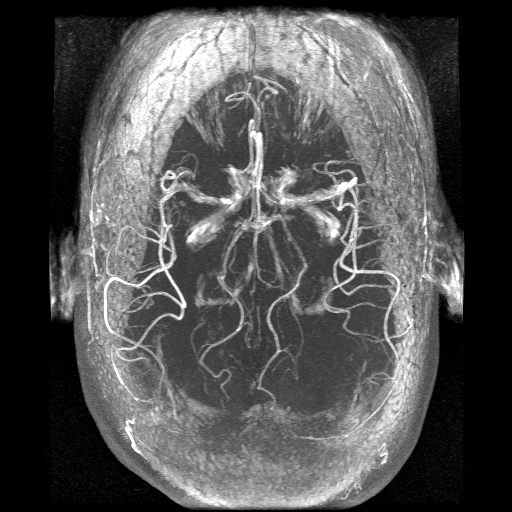
[im 5/206]
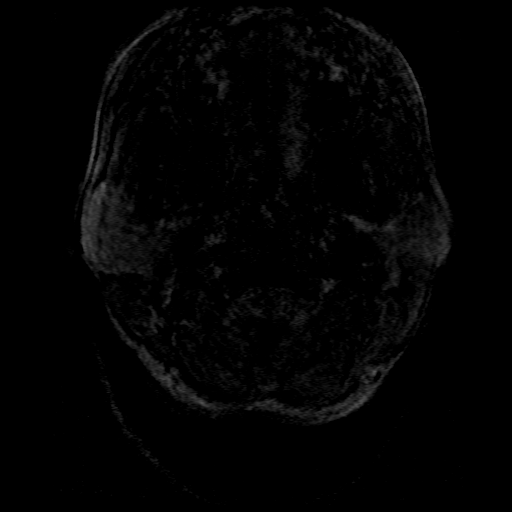
[im 9/206]
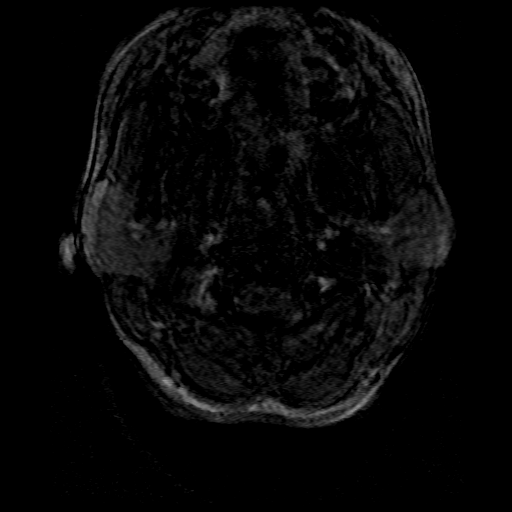
[im 14/206]
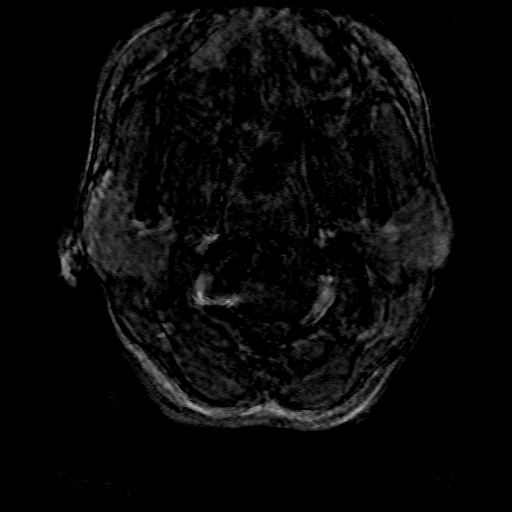
[im 18/206]
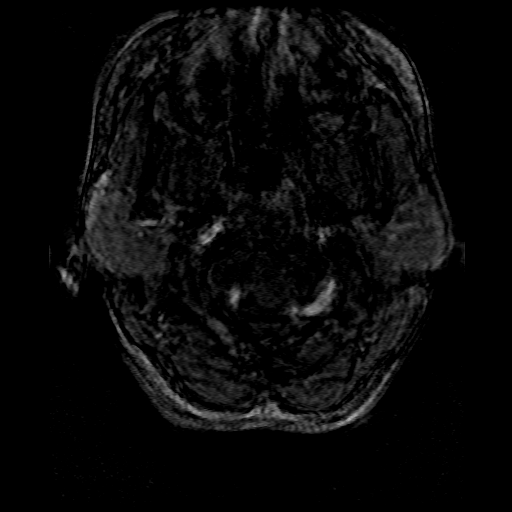
[im 23/206]
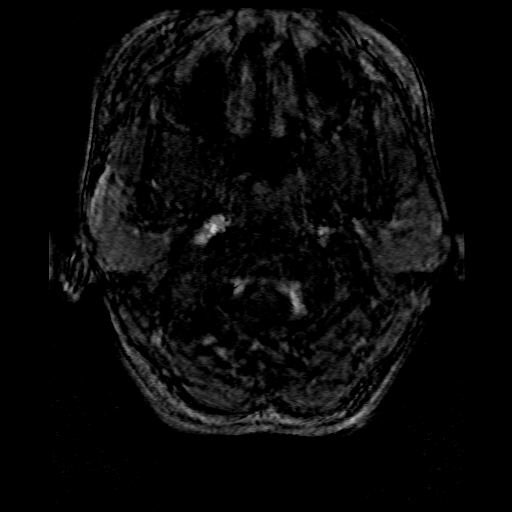
[im 32/206]
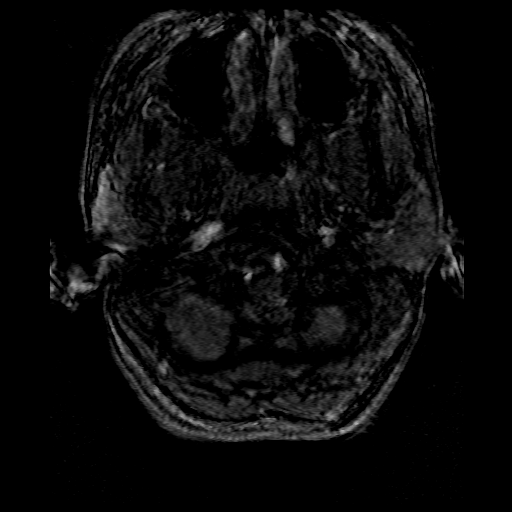
[im 36/206]
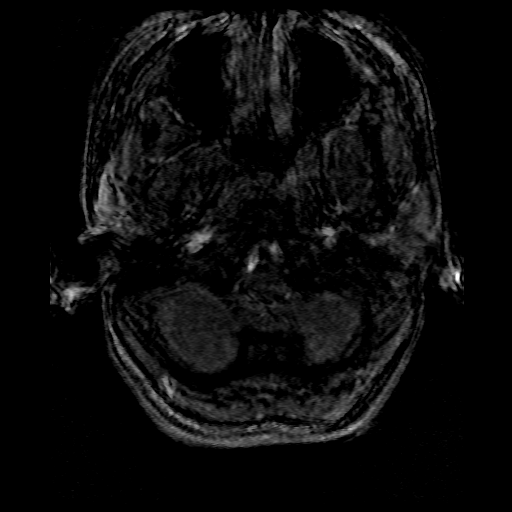
[im 63/206]
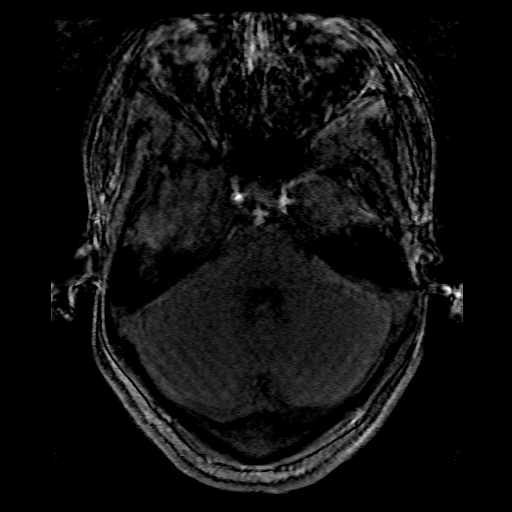
[im 90/206]
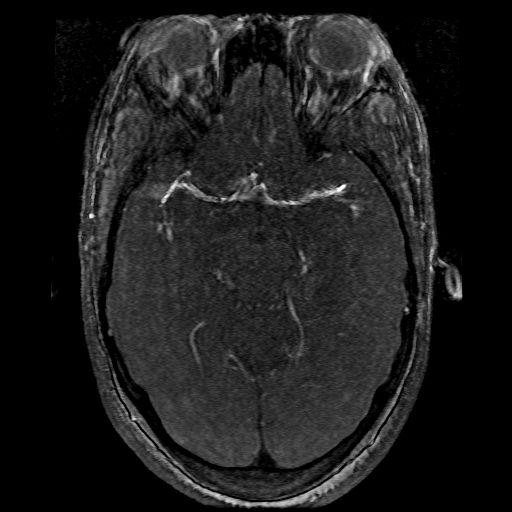
[im 103/206]
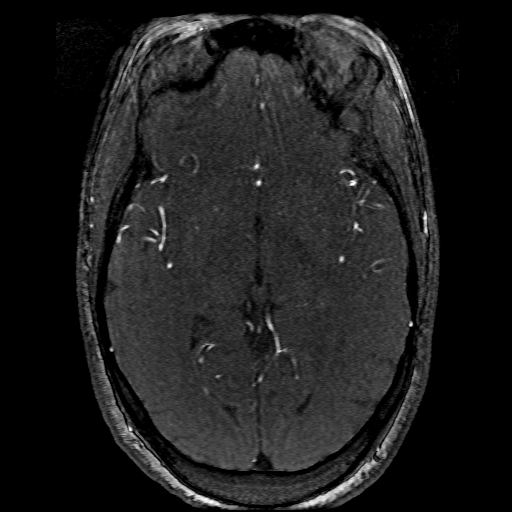
[im 116/206]
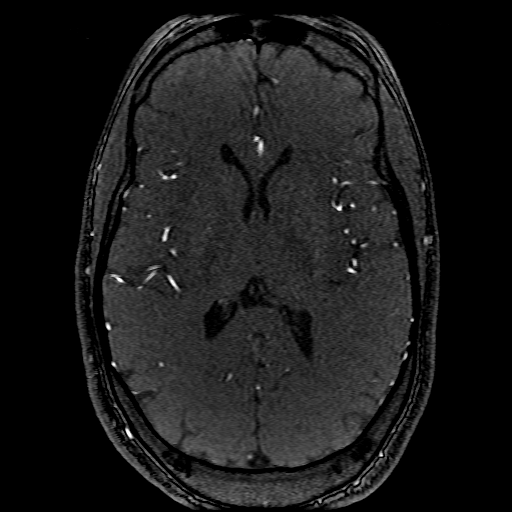
[im 143/206]
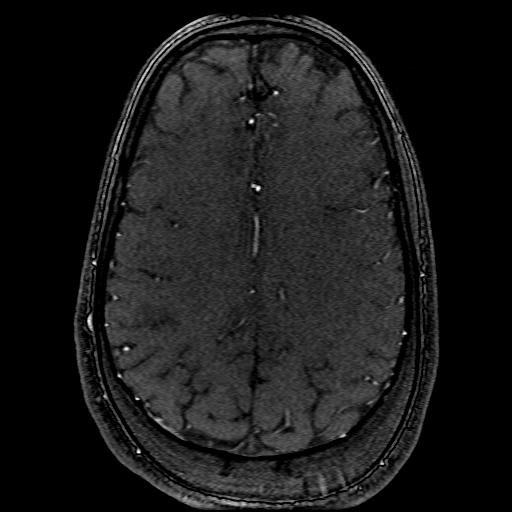
[im 170/206]
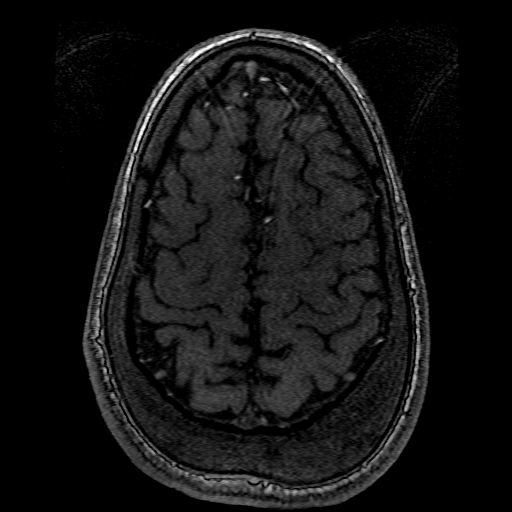
[im 174/206]
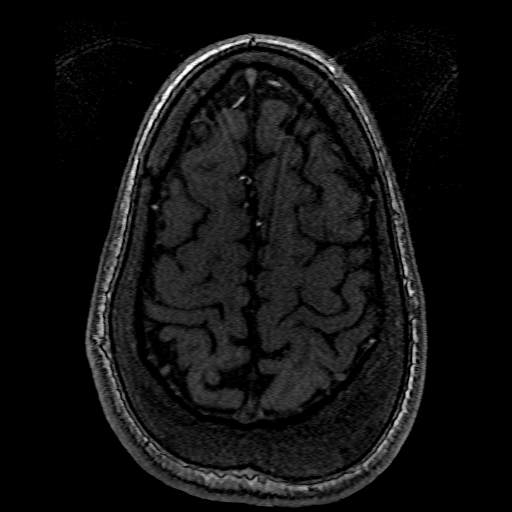
[im 197/206]
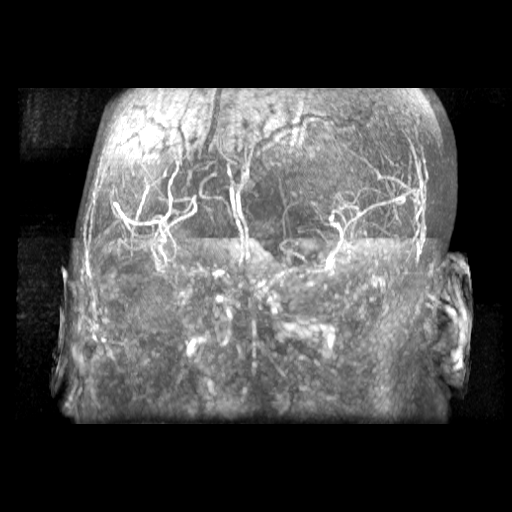

[Series 301: pjn · axial · 1.4mm · 0.20mm/px · 1 of 1 slices shown]
[im 1/1]
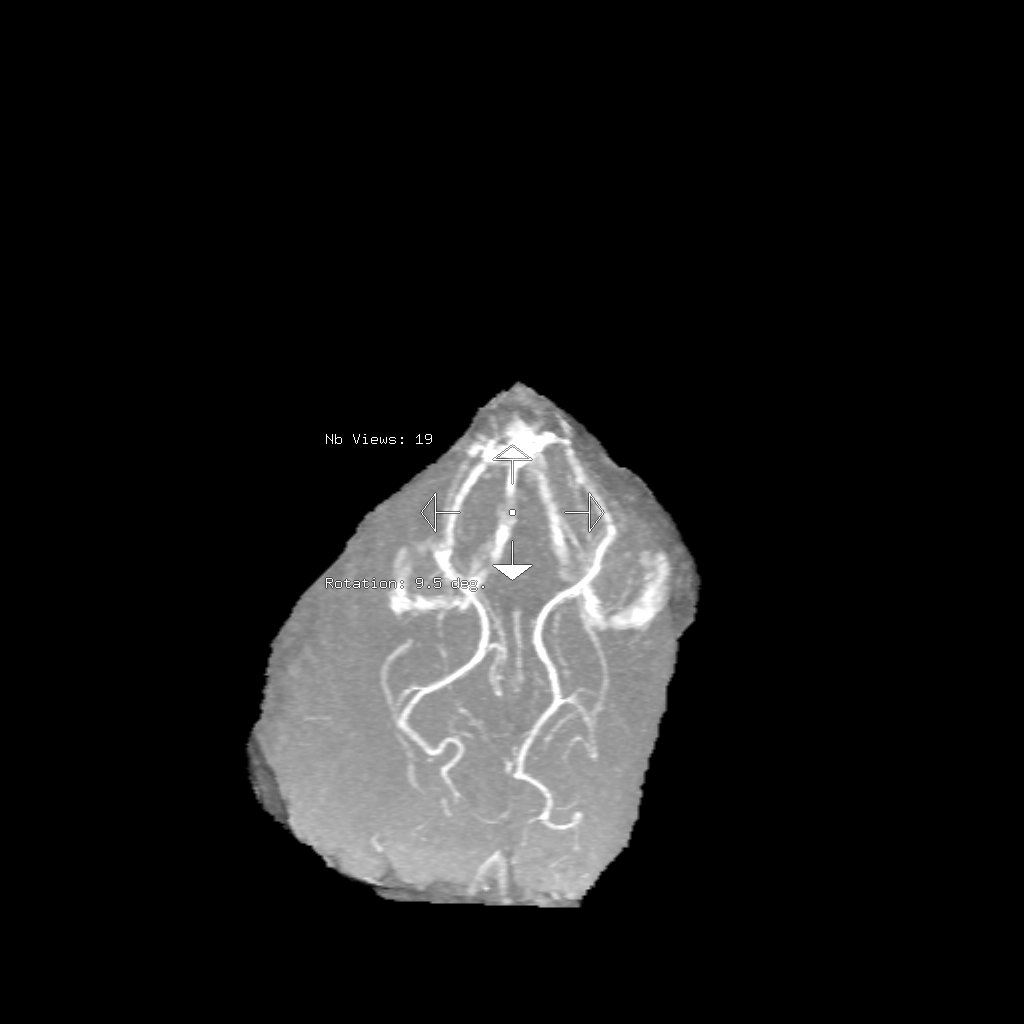

[17 of 48 positions shown; findings below may reference images not displayed]

FINDINGS: Extensive motion artifact from the upper cervical through the circle
of Willis to the level of foramen of Frander. Suboptimal assessment
for stenosis or aneurysm.

Bilateral internal carotid arteries, anterior cerebral arteries, and
middle cerebral arteries are patent. Symmetric distal circulation.

Bilateral vertebral arteries, the basilar artery, and bilateral
posterior cerebral arteries are patent. Symmetric distal
circulation.
IMPRESSION: Extensive motion artifact through the circle of Willis. No large
vessel occlusion. Symmetric distal circulation.

By: Song Maki M.D.

## 2017-04-28 NOTE — Telephone Encounter (Signed)
Referral have been processed and faxed

## 2017-04-30 ENCOUNTER — Encounter (HOSPITAL_COMMUNITY): Payer: Self-pay | Admitting: Emergency Medicine

## 2017-04-30 ENCOUNTER — Emergency Department (HOSPITAL_COMMUNITY)
Admission: EM | Admit: 2017-04-30 | Discharge: 2017-05-01 | Disposition: A | Discharge status: Home / Self Care | Payer: Medicare Other | Attending: Emergency Medicine | Admitting: Emergency Medicine

## 2017-04-30 DIAGNOSIS — D5700 Hb-SS disease with crisis, unspecified: Secondary | ICD-10-CM | POA: Diagnosis not present

## 2017-04-30 DIAGNOSIS — Z87891 Personal history of nicotine dependence: Secondary | ICD-10-CM | POA: Diagnosis not present

## 2017-04-30 DIAGNOSIS — M79602 Pain in left arm: Secondary | ICD-10-CM | POA: Diagnosis present

## 2017-04-30 DIAGNOSIS — Z79899 Other long term (current) drug therapy: Secondary | ICD-10-CM | POA: Diagnosis not present

## 2017-04-30 LAB — CBC WITH DIFFERENTIAL/PLATELET
Basophils Absolute: 0.1 10*3/uL (ref 0.0–0.1)
Basophils Relative: 1 %
EOS ABS: 0.5 10*3/uL (ref 0.0–0.7)
EOS PCT: 3 %
HCT: 26.1 % — ABNORMAL LOW (ref 36.0–46.0)
Hemoglobin: 9 g/dL — ABNORMAL LOW (ref 12.0–15.0)
LYMPHS ABS: 3.5 10*3/uL (ref 0.7–4.0)
Lymphocytes Relative: 23 %
MCH: 29.3 pg (ref 26.0–34.0)
MCHC: 34.5 g/dL (ref 30.0–36.0)
MCV: 85 fL (ref 78.0–100.0)
MONO ABS: 1.5 10*3/uL — AB (ref 0.1–1.0)
MONOS PCT: 10 %
Neutro Abs: 9.5 10*3/uL — ABNORMAL HIGH (ref 1.7–7.7)
Neutrophils Relative %: 63 %
PLATELETS: 596 10*3/uL — AB (ref 150–400)
RBC: 3.07 MIL/uL — ABNORMAL LOW (ref 3.87–5.11)
RDW: 20.3 % — ABNORMAL HIGH (ref 11.5–15.5)
WBC: 15.1 10*3/uL — ABNORMAL HIGH (ref 4.0–10.5)

## 2017-04-30 LAB — COMPREHENSIVE METABOLIC PANEL
ALT: 16 U/L (ref 14–54)
AST: 20 U/L (ref 15–41)
Albumin: 4.5 g/dL (ref 3.5–5.0)
Alkaline Phosphatase: 63 U/L (ref 38–126)
Anion gap: 7 (ref 5–15)
BUN: 8 mg/dL (ref 6–20)
CHLORIDE: 109 mmol/L (ref 101–111)
CO2: 23 mmol/L (ref 22–32)
CREATININE: 0.7 mg/dL (ref 0.44–1.00)
Calcium: 8.9 mg/dL (ref 8.9–10.3)
GFR calc Af Amer: >60 mL/min (ref >60)
Glucose, Bld: 91 mg/dL (ref 65–99)
Potassium: 3.6 mmol/L (ref 3.5–5.1)
SODIUM: 139 mmol/L (ref 135–145)
Total Bilirubin: 2.4 mg/dL — ABNORMAL HIGH (ref 0.3–1.2)
Total Protein: 7.8 g/dL (ref 6.5–8.1)

## 2017-04-30 LAB — RETICULOCYTES
RBC.: 3.07 MIL/uL — ABNORMAL LOW (ref 3.87–5.11)
RETIC CT PCT: 19.9 % — AB (ref 0.4–3.1)
Retic Count, Absolute: 610.9 10*3/uL — ABNORMAL HIGH (ref 19.0–186.0)

## 2017-04-30 LAB — I-STAT BETA HCG BLOOD, ED (MC, WL, AP ONLY): I-stat hCG, quantitative: <5 m[IU]/mL (ref <5)

## 2017-04-30 MED ORDER — DIPHENHYDRAMINE HCL 50 MG/ML IJ SOLN
25.0000 mg | Freq: Once | INTRAMUSCULAR | Status: AC
  Administered 2017-04-30: 25 mg via INTRAVENOUS
  Filled 2017-04-30: qty 1

## 2017-04-30 MED ORDER — SODIUM CHLORIDE 0.9 % IV BOLUS (SEPSIS)
1000.0000 mL | Freq: Once | INTRAVENOUS | Status: AC
  Administered 2017-05-01: 1000 mL via INTRAVENOUS

## 2017-04-30 MED ORDER — HYDROMORPHONE HCL 2 MG/ML IJ SOLN
2.0000 mg | Freq: Once | INTRAMUSCULAR | Status: AC
  Administered 2017-05-01: 2 mg via INTRAVENOUS
  Filled 2017-04-30: qty 1

## 2017-04-30 MED ORDER — KETOROLAC TROMETHAMINE 30 MG/ML IJ SOLN
30.0000 mg | Freq: Once | INTRAMUSCULAR | Status: AC
  Administered 2017-04-30: 30 mg via INTRAVENOUS
  Filled 2017-04-30: qty 1

## 2017-04-30 NOTE — ED Notes (Signed)
Pt from home with c/o left arm pain secondary to [place]. Pt states pain began today. Pt states she also has had her period for 2 months. Pt states she tried to make an appointment with her gyn and the earliest they can get her in is Jan. Pt also has a [place] scheduled in Jan. Pt denies CP or SOB

## 2017-04-30 NOTE — ED Notes (Signed)
Bed: IX18 Expected date:  Expected time:  Means of arrival:  Comments: 62 f back pain

## 2017-04-30 NOTE — ED Provider Notes (Signed)
[facility] DEPT Provider Note   CSN: [id number] Arrival date & time: 04/30/17  2155     History   Chief Complaint Chief Complaint  Patient presents with  . Sickle Cell Pain Crisis    HPI [name] is a 26 y.o. female.  Patient is a 26 year old female with history of sickle cell disease presenting for evaluation of left arm pain.  This began yesterday in the absence of any injury or trauma.  She feels a sharp throbbing pain to her left humerus, elbow, and forearm.  She is taking her home meds with little relief.  This pain feels similar to his prior sickle crises.  She denies any chest pain, difficulty breathing, or fevers.   The history is provided by the patient.  Sickle Cell Pain Crisis  Pain location: Left arm. Severity:  Severe Onset quality:  Sudden Duration:  1 day Similar to previous crisis episodes: yes   Timing:  Constant Progression:  Worsening Chronicity:  New Relieved by:  Nothing Worsened by:  Movement Ineffective treatments:  Prescription drugs Associated symptoms: no chest pain, no fever and no shortness of breath     Past Medical History:  Diagnosis Date  . Blood transfusion    "lots"  . Blood transfusion without reported diagnosis   . Chronic back pain    "very severe; have knot in my back; from tight muscle; take RX and exercise for it"  . Depression 01/06/2011  . Exertional dyspnea    "sometimes"  . Genital HSV   . GERD (gastroesophageal reflux disease) 02/17/2011  . Migraines 11/08/11   "@ least twice/month"  . 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  . Mood swings 11/08/11   "I go back and forth; real bad"  . Sickle cell anemia ([name])   . Sickle cell anemia with crisis ([place])   . Thrombocytosis ([place]) 11/09/2015  . Trichotillomania    h/o    Patient Active Problem List   Diagnosis Date Noted  . Leukocytosis   . Chronic anticoagulation   . Sickle cell anemia ([place]) 05/19/2016  . History  of DVT (deep vein thrombosis) 04/17/2016  . Thrombocytosis ([place]) 11/09/2015  . Hyperbilirubinemia 11/09/2015  . Chronic pain 08/04/2015  . Herpes simplex 07/14/2015  . Anemia of chronic disease   . 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 CHOLECYSTECTOMY  05/2010  . DILATION AND CURETTAGE OF UTERUS  02/20/11   S/P miscarriage  . IR GENERIC HISTORICAL  12/23/2015   IR FLUORO GUIDE CV LINE RIGHT 12/23/2015 Jacqulynn Cadet, MD [facility]  . IR GENERIC HISTORICAL  12/23/2015   IR US GUIDE VASC ACCESS RIGHT 12/23/2015 Jacqulynn Cadet, MD [facility]    OB History    Gravida Para Term Preterm AB Living   5 2 2  0 3 2   SAB TAB Ectopic Multiple Live Births   3 0 0 0 2      Obstetric Comments   Miscarried in October 2012 at about 7 weeks       Home Medications    Prior to Admission medications   Medication Sig Start Date End Date Taking? Authorizing Provider  acetaminophen (TYLENOL) 500 MG tablet Take 1,000 mg every 6 (six) hours as needed by mouth for moderate pain.    [provider]  aspirin-acetaminophen-caffeine (EXCEDRIN MIGRAINE) [phone] MG tablet Take 2  tablets every 6 (six) hours as needed by mouth for headache.    [provider]  Cyanocobalamin-Caffeine (ENERGY CHEWS PO) Take 2 tablets daily by mouth.    [provider]  folic acid (FOLVITE) 1 MG tablet Take 1 tablet (1 mg total) by mouth daily. 04/27/17   Scot Jun, FNP  hydroxyurea (HYDREA) 500 MG capsule Take 1 capsule (500 mg total) by mouth 2 (two) times daily. May take with food to minimize GI side effects. 04/27/17   Scot Jun, FNP  medroxyPROGESTERone (DEPO-PROVERA) 150 MG/ML injection Inject 1 mL (150 mg total) into the muscle every 3 (three) months. 09/21/16   Shelly Bombard, MD  morphine (MS CONTIN) 60 MG 12 hr tablet Take 60 mg by  mouth every 12 (twelve) hours as needed for pain.     [provider]  Multiple Vitamin (MULTIVITAMIN WITH MINERALS) TABS tablet Take 1 tablet daily by mouth.    [provider]  Multiple Vitamins-Minerals (HAIR SKIN AND NAILS FORMULA PO) Take 2 tablets daily by mouth.    [provider]  Oxycodone HCl 20 MG TABS Take 20 mg every 4 (four) hours as needed by mouth (pain).  01/17/17   [provider]  rivaroxaban (XARELTO) 20 MG TABS tablet Take 1 tablet (20 mg total) by mouth daily with supper. 04/27/17   Scot Jun, FNP    Family History Family History  Problem Relation Age of Onset  . Sickle cell trait Mother   . Sickle cell trait Father   . Diabetes Maternal Grandmother   . Diabetes Paternal Grandmother   . Hypertension Paternal Grandmother   . Diabetes Maternal Grandfather     Social History Social History   Tobacco Use  . Smoking status: Former Smoker    Packs/day: 0.25    Years: 1.00    Pack years: 0.25    Types: Cigarettes    Last attempt to quit: 03/25/2013    Years since quitting: 4.1  . Smokeless tobacco: Never Used  Substance Use Topics  . Alcohol use: No    Comment: denies  . Drug use: No    Comment: "Clean for 3 months"     Allergies   Food and Latex   Review of Systems Review of Systems  Constitutional: Negative for fever.  Respiratory: Negative for shortness of breath.   Cardiovascular: Negative for chest pain.  All other systems reviewed and are negative.    Physical Exam Updated Vital Signs BP 117/77   Pulse 91   Temp 98.8 F (37.1 C) (Oral)   Resp 16   SpO2 100%   Physical Exam  Constitutional: She is oriented to person, place, and time. She appears well-developed and well-nourished. No distress.  HENT:  Head: Normocephalic and atraumatic.  Neck: Normal range of motion. Neck supple.  Cardiovascular: Normal rate and regular rhythm. Exam reveals no gallop and no friction rub.  No murmur  heard. Pulmonary/Chest: Effort normal and breath sounds normal. No respiratory distress. She has no wheezes.  Abdominal: Soft. Bowel sounds are normal. She exhibits no distension. There is no tenderness.  Musculoskeletal: Normal range of motion.  The left arm appears grossly normal.  There is no swelling or redness or warmth.  Ulnar and radial pulses are easily palpable.  She was able to flex extend, and oppose all fingers.  She does have exquisite tenderness over the distal humerus, elbow, and proximal forearm.  She has range of motion, however with some discomfort.  Neurological: She is alert and oriented to person, place, and time.  Skin: Skin is warm and dry. She is not diaphoretic.  Nursing note and vitals reviewed.    ED Treatments / Results  Labs (all labs ordered are listed, but only abnormal results are displayed) Labs Reviewed  CBC WITH DIFFERENTIAL/PLATELET - Abnormal; Notable for the following components:      Result Value   WBC 15.1 (*)    RBC 3.07 (*)    Hemoglobin 9.0 (*)    HCT 26.1 (*)    RDW 20.3 (*)    Platelets 596 (*)    Neutro Abs 9.5 (*)    Monocytes Absolute 1.5 (*)    All other components within normal limits  RETICULOCYTES - Abnormal; Notable for the following components:   Retic Ct Pct 19.9 (*)    RBC. 3.07 (*)    Retic Count, Absolute 610.9 (*)    All other components within normal limits  COMPREHENSIVE METABOLIC PANEL  I-STAT BETA HCG BLOOD, ED ([facility], [facility], [facility] ONLY)  POC URINE PREG, ED    EKG  EKG Interpretation None       Radiology No results found.  Procedures Procedures (including critical care time)  Medications Ordered in ED Medications  sodium chloride 0.9 % bolus 1,000 mL (not administered)  ketorolac (TORADOL) 30 MG/ML injection 30 mg (not administered)  HYDROmorphone (DILAUDID) injection 2 mg (not administered)  diphenhydrAMINE (BENADRYL) injection 25 mg (not administered)     Initial Impression / Assessment and Plan / ED  Course  I have reviewed the triage vital signs and the nursing notes.  Pertinent labs & imaging results that were available during my care of the patient were reviewed by me and considered in my medical decision making (see chart for details).  Patient with history of sickle cell disease presenting for evaluation of left arm pain that she believes is a sickle crisis.  Her laboratory studies are consistent with baseline.  She is experiencing no chest discomfort or difficulty breathing.  She is feeling better after IV fluids and medications given in the ER.  She will be discharged and will follow up with the sickle cell clinic if her symptoms return.  Final Clinical Impressions(s) / ED Diagnoses   Final diagnoses:  None    ED Discharge Orders    None       Veryl Speak, MD 05/01/17 [phone]

## 2017-05-01 ENCOUNTER — Encounter: Payer: Self-pay | Admitting: Family Medicine

## 2017-05-01 DIAGNOSIS — F329 Major depressive disorder, single episode, unspecified: Secondary | ICD-10-CM | POA: Diagnosis not present

## 2017-05-01 DIAGNOSIS — F633 Trichotillomania: Secondary | ICD-10-CM | POA: Diagnosis not present

## 2017-05-01 DIAGNOSIS — D500 Iron deficiency anemia secondary to blood loss (chronic): Secondary | ICD-10-CM | POA: Diagnosis present

## 2017-05-01 DIAGNOSIS — Z9049 Acquired absence of other specified parts of digestive tract: Secondary | ICD-10-CM

## 2017-05-01 DIAGNOSIS — Z86718 Personal history of other venous thrombosis and embolism: Secondary | ICD-10-CM

## 2017-05-01 DIAGNOSIS — Z79891 Long term (current) use of opiate analgesic: Secondary | ICD-10-CM

## 2017-05-01 DIAGNOSIS — N920 Excessive and frequent menstruation with regular cycle: Secondary | ICD-10-CM | POA: Diagnosis present

## 2017-05-01 DIAGNOSIS — K219 Gastro-esophageal reflux disease without esophagitis: Secondary | ICD-10-CM | POA: Diagnosis present

## 2017-05-01 DIAGNOSIS — Z91040 Latex allergy status: Secondary | ICD-10-CM

## 2017-05-01 DIAGNOSIS — D72829 Elevated white blood cell count, unspecified: Secondary | ICD-10-CM | POA: Diagnosis present

## 2017-05-01 DIAGNOSIS — Z79899 Other long term (current) drug therapy: Secondary | ICD-10-CM | POA: Diagnosis not present

## 2017-05-01 DIAGNOSIS — M549 Dorsalgia, unspecified: Secondary | ICD-10-CM | POA: Diagnosis present

## 2017-05-01 DIAGNOSIS — Z8249 Family history of ischemic heart disease and other diseases of the circulatory system: Secondary | ICD-10-CM

## 2017-05-01 DIAGNOSIS — Z833 Family history of diabetes mellitus: Secondary | ICD-10-CM

## 2017-05-01 DIAGNOSIS — Z7901 Long term (current) use of anticoagulants: Secondary | ICD-10-CM

## 2017-05-01 DIAGNOSIS — Z832 Family history of diseases of the blood and blood-forming organs and certain disorders involving the immune mechanism: Secondary | ICD-10-CM | POA: Diagnosis not present

## 2017-05-01 DIAGNOSIS — Z87891 Personal history of nicotine dependence: Secondary | ICD-10-CM

## 2017-05-01 DIAGNOSIS — G894 Chronic pain syndrome: Secondary | ICD-10-CM | POA: Diagnosis not present

## 2017-05-01 DIAGNOSIS — D5700 Hb-SS disease with crisis, unspecified: Secondary | ICD-10-CM | POA: Diagnosis not present

## 2017-05-01 DIAGNOSIS — Z91018 Allergy to other foods: Secondary | ICD-10-CM

## 2017-05-01 LAB — POC URINE PREG, ED: Preg Test, Ur: NEGATIVE

## 2017-05-01 MED ORDER — HYDROMORPHONE HCL 1 MG/ML IJ SOLN
1.0000 mg | Freq: Once | INTRAMUSCULAR | Status: AC
  Administered 2017-05-01: 1 mg via INTRAVENOUS
  Filled 2017-05-01: qty 1

## 2017-05-01 MED ORDER — HEPARIN SOD (PORK) LOCK FLUSH 100 UNIT/ML IV SOLN
500.0000 [IU] | Freq: Once | INTRAVENOUS | Status: AC
  Administered 2017-05-01: 500 [IU]
  Filled 2017-05-01: qty 5

## 2017-05-01 MED ORDER — DIPHENHYDRAMINE HCL 50 MG/ML IJ SOLN
25.0000 mg | Freq: Once | INTRAMUSCULAR | Status: AC
  Administered 2017-05-01: 25 mg via INTRAVENOUS
  Filled 2017-05-01: qty 1

## 2017-05-01 MED ORDER — HYDROMORPHONE HCL 2 MG/ML IJ SOLN
2.0000 mg | Freq: Once | INTRAMUSCULAR | Status: AC
  Administered 2017-05-01: 2 mg via INTRAVENOUS
  Filled 2017-05-01: qty 1

## 2017-05-01 NOTE — Discharge Instructions (Signed)
Continue your home medications as previously prescribed.  Follow-up in the sickle cell clinic if your symptoms return, change, or worsen.

## 2017-05-02 ENCOUNTER — Ambulatory Visit (HOSPITAL_COMMUNITY): Admission: RE | Admit: 2017-05-02 | Payer: Medicare Other | Source: Ambulatory Visit

## 2017-05-04 ENCOUNTER — Inpatient Hospital Stay (HOSPITAL_COMMUNITY)
Admission: EM | Admit: 2017-05-04 | Discharge: 2017-05-07 | DRG: 812 | Disposition: A | Discharge status: Home / Self Care | Payer: Medicare Other | Attending: Internal Medicine | Admitting: Internal Medicine

## 2017-05-04 ENCOUNTER — Other Ambulatory Visit: Payer: Self-pay

## 2017-05-04 ENCOUNTER — Encounter (HOSPITAL_COMMUNITY): Payer: Self-pay | Admitting: Emergency Medicine

## 2017-05-04 DIAGNOSIS — D72829 Elevated white blood cell count, unspecified: Secondary | ICD-10-CM | POA: Diagnosis present

## 2017-05-04 DIAGNOSIS — Z91040 Latex allergy status: Secondary | ICD-10-CM | POA: Diagnosis not present

## 2017-05-04 DIAGNOSIS — Z9049 Acquired absence of other specified parts of digestive tract: Secondary | ICD-10-CM | POA: Diagnosis not present

## 2017-05-04 DIAGNOSIS — Z7901 Long term (current) use of anticoagulants: Secondary | ICD-10-CM | POA: Diagnosis not present

## 2017-05-04 DIAGNOSIS — D500 Iron deficiency anemia secondary to blood loss (chronic): Secondary | ICD-10-CM | POA: Diagnosis present

## 2017-05-04 DIAGNOSIS — F633 Trichotillomania: Secondary | ICD-10-CM | POA: Diagnosis present

## 2017-05-04 DIAGNOSIS — Z86718 Personal history of other venous thrombosis and embolism: Secondary | ICD-10-CM | POA: Diagnosis not present

## 2017-05-04 DIAGNOSIS — N920 Excessive and frequent menstruation with regular cycle: Secondary | ICD-10-CM | POA: Diagnosis present

## 2017-05-04 DIAGNOSIS — Z79891 Long term (current) use of opiate analgesic: Secondary | ICD-10-CM | POA: Diagnosis not present

## 2017-05-04 DIAGNOSIS — D5700 Hb-SS disease with crisis, unspecified: Secondary | ICD-10-CM | POA: Diagnosis present

## 2017-05-04 DIAGNOSIS — K219 Gastro-esophageal reflux disease without esophagitis: Secondary | ICD-10-CM | POA: Diagnosis present

## 2017-05-04 DIAGNOSIS — Z833 Family history of diabetes mellitus: Secondary | ICD-10-CM | POA: Diagnosis not present

## 2017-05-04 DIAGNOSIS — Z91018 Allergy to other foods: Secondary | ICD-10-CM | POA: Diagnosis not present

## 2017-05-04 DIAGNOSIS — M549 Dorsalgia, unspecified: Secondary | ICD-10-CM | POA: Diagnosis present

## 2017-05-04 DIAGNOSIS — Z79899 Other long term (current) drug therapy: Secondary | ICD-10-CM | POA: Diagnosis not present

## 2017-05-04 DIAGNOSIS — Z832 Family history of diseases of the blood and blood-forming organs and certain disorders involving the immune mechanism: Secondary | ICD-10-CM | POA: Diagnosis not present

## 2017-05-04 DIAGNOSIS — F329 Major depressive disorder, single episode, unspecified: Secondary | ICD-10-CM | POA: Diagnosis present

## 2017-05-04 DIAGNOSIS — G894 Chronic pain syndrome: Secondary | ICD-10-CM | POA: Diagnosis present

## 2017-05-04 DIAGNOSIS — Z87891 Personal history of nicotine dependence: Secondary | ICD-10-CM | POA: Diagnosis not present

## 2017-05-04 DIAGNOSIS — Z8249 Family history of ischemic heart disease and other diseases of the circulatory system: Secondary | ICD-10-CM | POA: Diagnosis not present

## 2017-05-04 LAB — CBC WITH DIFFERENTIAL/PLATELET
BASOS ABS: 0.1 10*3/uL (ref 0.0–0.1)
Basophils Relative: 0 %
EOS PCT: 2 %
Eosinophils Absolute: 0.4 10*3/uL (ref 0.0–0.7)
HCT: 23.8 % — ABNORMAL LOW (ref 36.0–46.0)
Hemoglobin: 8.1 g/dL — ABNORMAL LOW (ref 12.0–15.0)
LYMPHS PCT: 14 %
Lymphs Abs: 2.5 10*3/uL (ref 0.7–4.0)
MCH: 29.1 pg (ref 26.0–34.0)
MCHC: 34 g/dL (ref 30.0–36.0)
MCV: 85.6 fL (ref 78.0–100.0)
MONO ABS: 1.7 10*3/uL — AB (ref 0.1–1.0)
MONOS PCT: 10 %
Neutro Abs: 13.1 10*3/uL — ABNORMAL HIGH (ref 1.7–7.7)
Neutrophils Relative %: 74 %
PLATELETS: 537 10*3/uL — AB (ref 150–400)
RBC: 2.78 MIL/uL — ABNORMAL LOW (ref 3.87–5.11)
RDW: 19.6 % — AB (ref 11.5–15.5)
WBC: 17.8 10*3/uL — ABNORMAL HIGH (ref 4.0–10.5)

## 2017-05-04 LAB — COMPREHENSIVE METABOLIC PANEL
ALBUMIN: 4.2 g/dL (ref 3.5–5.0)
ALK PHOS: 56 U/L (ref 38–126)
ALT: 19 U/L (ref 14–54)
AST: 24 U/L (ref 15–41)
Anion gap: 6 (ref 5–15)
BILIRUBIN TOTAL: 3.1 mg/dL — AB (ref 0.3–1.2)
BUN: 8 mg/dL (ref 6–20)
CO2: 25 mmol/L (ref 22–32)
Calcium: 8.8 mg/dL — ABNORMAL LOW (ref 8.9–10.3)
Chloride: 103 mmol/L (ref 101–111)
Creatinine, Ser: 0.67 mg/dL (ref 0.44–1.00)
GFR calc Af Amer: >60 mL/min (ref >60)
GFR calc non Af Amer: >60 mL/min (ref >60)
GLUCOSE: 94 mg/dL (ref 65–99)
POTASSIUM: 3.4 mmol/L — AB (ref 3.5–5.1)
SODIUM: 134 mmol/L — AB (ref 135–145)
TOTAL PROTEIN: 7.2 g/dL (ref 6.5–8.1)

## 2017-05-04 LAB — RETICULOCYTES
RBC.: 2.78 MIL/uL — ABNORMAL LOW (ref 3.87–5.11)
Retic Count, Absolute: 514.3 10*3/uL — ABNORMAL HIGH (ref 19.0–186.0)
Retic Ct Pct: 18.5 % — ABNORMAL HIGH (ref 0.4–3.1)

## 2017-05-04 LAB — I-STAT BETA HCG BLOOD, ED (MC, WL, AP ONLY): I-stat hCG, quantitative: <5 m[IU]/mL (ref <5)

## 2017-05-04 MED ORDER — RIVAROXABAN 20 MG PO TABS
20.0000 mg | ORAL_TABLET | Freq: Every day | ORAL | Status: DC
  Administered 2017-05-05 – 2017-05-07 (×3): 20 mg via ORAL
  Filled 2017-05-04 (×3): qty 1

## 2017-05-04 MED ORDER — SODIUM CHLORIDE 0.45 % IV SOLN
INTRAVENOUS | Status: DC
  Administered 2017-05-04: 17:00:00 via INTRAVENOUS

## 2017-05-04 MED ORDER — HYDROMORPHONE HCL 2 MG/ML IJ SOLN
2.0000 mg | INTRAMUSCULAR | Status: AC
  Administered 2017-05-04: 2 mg via INTRAVENOUS
  Filled 2017-05-04: qty 1

## 2017-05-04 MED ORDER — DIPHENHYDRAMINE HCL 50 MG/ML IJ SOLN
25.0000 mg | Freq: Once | INTRAMUSCULAR | Status: AC
  Administered 2017-05-04: 25 mg via INTRAVENOUS
  Filled 2017-05-04: qty 1

## 2017-05-04 MED ORDER — HYDROMORPHONE HCL 2 MG/ML IJ SOLN: 2.0000 mg | INTRAMUSCULAR | Status: DC

## 2017-05-04 MED ORDER — HYDROMORPHONE 1 MG/ML IV SOLN
INTRAVENOUS | Status: DC
  Administered 2017-05-04: 23:00:00 via INTRAVENOUS
  Administered 2017-05-05: 7.5 mg via INTRAVENOUS
  Administered 2017-05-05: 4.7 mg via INTRAVENOUS
  Administered 2017-05-05: 22:00:00 via INTRAVENOUS
  Administered 2017-05-05: 6.538 mg via INTRAVENOUS
  Administered 2017-05-05: 4.4 mg via INTRAVENOUS
  Administered 2017-05-05: 8.98 mg via INTRAVENOUS
  Administered 2017-05-05: 3 mg via INTRAVENOUS
  Administered 2017-05-06: 12:00:00 via INTRAVENOUS
  Administered 2017-05-06: 0 mg via INTRAVENOUS
  Administered 2017-05-06: 17.86 mg via INTRAVENOUS
  Administered 2017-05-06: 25 mg via INTRAVENOUS
  Administered 2017-05-06: 6.84 mg via INTRAVENOUS
  Administered 2017-05-06: 6.58 mg via INTRAVENOUS
  Administered 2017-05-07: 10:00:00 via INTRAVENOUS
  Administered 2017-05-07: 18.73 mg via INTRAVENOUS
  Filled 2017-05-04 (×7): qty 25

## 2017-05-04 MED ORDER — KETOROLAC TROMETHAMINE 15 MG/ML IJ SOLN
15.0000 mg | Freq: Four times a day (QID) | INTRAMUSCULAR | Status: AC
  Administered 2017-05-04 – 2017-05-06 (×5): 15 mg via INTRAVENOUS
  Filled 2017-05-04 (×5): qty 1

## 2017-05-04 MED ORDER — HYDROMORPHONE HCL 2 MG/ML IJ SOLN: 2.0000 mg | INTRAMUSCULAR | Status: AC

## 2017-05-04 MED ORDER — KETOROLAC TROMETHAMINE 30 MG/ML IJ SOLN
15.0000 mg | Freq: Once | INTRAMUSCULAR | Status: AC
  Administered 2017-05-04: 15 mg via INTRAVENOUS
  Filled 2017-05-04: qty 1

## 2017-05-04 MED ORDER — ONDANSETRON HCL 4 MG/2ML IJ SOLN
4.0000 mg | INTRAMUSCULAR | Status: DC | PRN
  Administered 2017-05-04: 4 mg via INTRAVENOUS
  Filled 2017-05-04: qty 2

## 2017-05-04 MED ORDER — SODIUM CHLORIDE 0.9% FLUSH
10.0000 mL | INTRAVENOUS | Status: DC | PRN
  Administered 2017-05-07: 10 mL
  Filled 2017-05-04: qty 40

## 2017-05-04 MED ORDER — ADULT MULTIVITAMIN W/MINERALS CH
1.0000 | ORAL_TABLET | Freq: Every day | ORAL | Status: DC
  Administered 2017-05-05 – 2017-05-07 (×3): 1 via ORAL
  Filled 2017-05-04 (×3): qty 1

## 2017-05-04 MED ORDER — SODIUM CHLORIDE 0.9% FLUSH: 9.0000 mL | INTRAVENOUS | Status: DC | PRN

## 2017-05-04 MED ORDER — HYDROXYUREA 500 MG PO CAPS
500.0000 mg | ORAL_CAPSULE | Freq: Two times a day (BID) | ORAL | Status: DC
  Administered 2017-05-04 – 2017-05-07 (×6): 500 mg via ORAL
  Filled 2017-05-04 (×7): qty 1

## 2017-05-04 MED ORDER — SODIUM CHLORIDE 0.45 % IV SOLN
INTRAVENOUS | Status: AC
  Administered 2017-05-04: 23:00:00 via INTRAVENOUS

## 2017-05-04 MED ORDER — ONDANSETRON HCL 4 MG/2ML IJ SOLN
4.0000 mg | Freq: Four times a day (QID) | INTRAMUSCULAR | Status: DC | PRN
Start: 2017-05-04 — End: 2017-05-07

## 2017-05-04 MED ORDER — MORPHINE SULFATE ER 30 MG PO TBCR
60.0000 mg | EXTENDED_RELEASE_TABLET | Freq: Two times a day (BID) | ORAL | Status: DC
  Administered 2017-05-04 – 2017-05-07 (×6): 60 mg via ORAL
  Filled 2017-05-04 (×6): qty 2

## 2017-05-04 MED ORDER — SENNOSIDES-DOCUSATE SODIUM 8.6-50 MG PO TABS
1.0000 | ORAL_TABLET | Freq: Two times a day (BID) | ORAL | Status: DC
  Administered 2017-05-04 – 2017-05-07 (×4): 1 via ORAL
  Filled 2017-05-04 (×6): qty 1

## 2017-05-04 MED ORDER — HYDROMORPHONE HCL 2 MG/ML IJ SOLN
2.0000 mg | INTRAMUSCULAR | Status: DC
  Administered 2017-05-04 (×2): 2 mg via INTRAVENOUS
  Filled 2017-05-04: qty 1

## 2017-05-04 MED ORDER — POLYETHYLENE GLYCOL 3350 17 G PO PACK: 17.0000 g | PACK | Freq: Every day | ORAL | Status: DC | PRN

## 2017-05-04 MED ORDER — HYDROMORPHONE HCL 2 MG/ML IJ SOLN
INTRAMUSCULAR | Status: AC
  Filled 2017-05-04: qty 1

## 2017-05-04 MED ORDER — NALOXONE HCL 0.4 MG/ML IJ SOLN: 0.4000 mg | INTRAMUSCULAR | Status: DC | PRN

## 2017-05-04 NOTE — ED Notes (Addendum)
unsuccessful blood draw attempt in triage x2

## 2017-05-04 NOTE — H&P (Signed)
History and Physical    [name] TFT:[id number] DOB: [birth year] DOA: 05/04/2017  PCP: Scot Jun, FNP  Patient coming from: Home.  Chief Complaint: Right groin pain.  HPI: [name] is a 26 y.o. female with history of sickle cell anemia presents with increasing pain in the right groin over the last few days.  Patient states she has been trying to cut down her long-acting morphine since he has 2 kids at home.  She has been otherwise taking her other medications including hydroxyurea and Xarelto.  Denies any chest pain shortness of breath nausea vomiting abdominal pain.  Patient was not hypoxic or did not have any fever in the ER.  Patient has been having menorrhagia for which she was scheduled to have a pelvic sonogram 2 days ago which she missed.  ED Course: Patient was given multiple doses of pain relief medications despite which patient still having pain and has not been admitted for pain relief from sickle cell pain crisis.  Review of Systems: As per HPI, rest all negative.   Past Medical History:  Diagnosis Date  . Blood transfusion    "lots"  . Blood transfusion without reported diagnosis   . Chronic back pain    "very severe; have knot in my back; from tight muscle; take RX and exercise for it"  . Depression 01/06/2011  . Exertional dyspnea    "sometimes"  . Genital HSV   . GERD (gastroesophageal reflux disease) 02/17/2011  . Migraines 11/08/11   "@ least twice/month"  . 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  . Mood swings 11/08/11   "I go back and forth; real bad"  . Sickle cell anemia ([name])   . Sickle cell anemia with crisis ([place])   . Thrombocytosis ([place]) 11/09/2015  . Trichotillomania    h/o    Past Surgical History:  Procedure Laterality Date  . CHOLECYSTECTOMY  05/2010  . DILATION AND CURETTAGE OF UTERUS  02/20/11   S/P miscarriage  . IR GENERIC HISTORICAL  12/23/2015   IR FLUORO GUIDE CV LINE RIGHT 12/23/2015 Jacqulynn Cadet,  MD [facility]  . IR GENERIC HISTORICAL  12/23/2015   IR US GUIDE VASC ACCESS RIGHT 12/23/2015 Jacqulynn Cadet, MD [facility]     reports that she quit smoking about 4 years ago. Her smoking use included cigarettes. She has a 0.25 pack-year smoking history. she has never used smokeless tobacco. She reports that she does not drink alcohol or use drugs.  Allergies  Allergen Reactions  . Food Hives and Other (See Comments)    Pt is allergic to carrots.    . Latex Rash    Family History  Problem Relation Age of Onset  . Sickle cell trait Mother   . Sickle cell trait Father   . Diabetes Maternal Grandmother   . Diabetes Paternal Grandmother   . Hypertension Paternal Grandmother   . Diabetes Maternal Grandfather     Prior to Admission medications   Medication Sig Start Date End Date Taking? Authorizing Provider  aspirin-acetaminophen-caffeine (EXCEDRIN MIGRAINE) [phone] MG tablet Take 2 tablets every 6 (six) hours as needed by mouth for headache.   Yes [provider]  Cyanocobalamin-Caffeine (ENERGY CHEWS PO) Take 2 tablets daily by mouth.   Yes [provider]  hydroxyurea (HYDREA) 500 MG capsule Take 1 capsule (500 mg total) by mouth 2 (two) times daily. May take with food to minimize GI side effects. 04/27/17  Yes Scot Jun, FNP  medroxyPROGESTERone (DEPO-PROVERA) 150 MG/ML injection Inject 1 mL (150 mg total) into the muscle every 3 (three) months. 09/21/16  Yes Shelly Bombard, MD  morphine (MS CONTIN) 60 MG 12 hr tablet Take 60 mg by mouth every 12 (twelve) hours as needed for pain.    Yes [provider]  Multiple Vitamin (MULTIVITAMIN WITH MINERALS) TABS tablet Take 1 tablet daily by mouth.   Yes [provider]  Multiple Vitamins-Minerals (HAIR SKIN AND NAILS FORMULA PO) Take 2 tablets daily by mouth.   Yes [provider]  Oxycodone HCl 20 MG TABS Take 20 mg every 4 (four) hours as needed by mouth (pain).  01/17/17  Yes  [provider]  rivaroxaban (XARELTO) 20 MG TABS tablet Take 1 tablet (20 mg total) by mouth daily with supper. 04/27/17  Yes Scot Jun, FNP  folic acid (FOLVITE) 1 MG tablet Take 1 tablet (1 mg total) by mouth daily. Patient not taking: Reported on 05/04/2017 04/27/17   Scot Jun, FNP    Physical Exam: Vitals:   05/04/17 1823 05/04/17 1830 05/04/17 1900 05/04/17 2000  BP: 105/72 109/68 110/70 116/71  Pulse: 92 85 (!) 106 94  Resp: 15 17 12  (!) 24  Temp:      TempSrc:      SpO2: 97% 93% (!) 88% 94%  Weight:      Height:          Constitutional: Moderately built and nourished. Vitals:   05/04/17 1823 05/04/17 1830 05/04/17 1900 05/04/17 2000  BP: 105/72 109/68 110/70 116/71  Pulse: 92 85 (!) 106 94  Resp: 15 17 12  (!) 24  Temp:      TempSrc:      SpO2: 97% 93% (!) 88% 94%  Weight:      Height:       Eyes: Anicteric no pallor. ENMT: No discharge from the ears eyes nose or mouth. Neck: No mass felt.  No neck rigidity. Respiratory: No rhonchi or crepitations. Cardiovascular: S1-S2 heard no murmurs appreciated. Abdomen: Soft nontender bowel sounds present. Musculoskeletal: No edema.  No joint effusion.  Pain on the right hip. Skin: No rash. Neurologic: Alert awake oriented to time place and person.  Moves all extremities. Psychiatric: Appears normal.  Normal affect.   Labs on Admission: I have personally reviewed following labs and imaging studies  CBC: Recent Labs  Lab 04/30/17 2237 05/04/17 1654  WBC 15.1* 17.8*  NEUTROABS 9.5* 13.1*  HGB 9.0* 8.1*  HCT 26.1* 23.8*  MCV 85.0 85.6  PLT 596* 086*   Basic Metabolic Panel: Recent Labs  Lab 04/30/17 2237 05/04/17 1654  NA 139 134*  K 3.6 3.4*  CL 109 103  CO2 23 25  GLUCOSE 91 94  BUN 8 8  CREATININE 0.70 0.67  CALCIUM 8.9 8.8*   GFR: Estimated Creatinine Clearance: 113 mL/min (by C-G formula based on SCr of 0.67 mg/dL). Liver Function Tests: Recent Labs  Lab  04/30/17 2237 05/04/17 1654  AST 20 24  ALT 16 19  ALKPHOS 63 56  BILITOT 2.4* 3.1*  PROT 7.8 7.2  ALBUMIN 4.5 4.2   No results for input(s): LIPASE, AMYLASE in the last 168 hours. No results for input(s): AMMONIA in the last 168 hours. Coagulation Profile: No results for input(s): INR, PROTIME in the last 168 hours. Cardiac Enzymes: No results for input(s): CKTOTAL, CKMB, CKMBINDEX, TROPONINI in the last 168 hours. BNP (last 3 results) No results for input(s): PROBNP in the last 8760  hours. HbA1C: No results for input(s): HGBA1C in the last 72 hours. CBG: No results for input(s): GLUCAP in the last 168 hours. Lipid Profile: No results for input(s): CHOL, HDL, LDLCALC, TRIG, CHOLHDL, LDLDIRECT in the last 72 hours. Thyroid Function Tests: No results for input(s): TSH, T4TOTAL, FREET4, T3FREE, THYROIDAB in the last 72 hours. Anemia Panel: Recent Labs    05/04/17 1654  RETICCTPCT 18.5*   Urine analysis:    Component Value Date/Time   COLORURINE YELLOW 02/02/2017 0027   APPEARANCEUR CLEAR 02/02/2017 0027   APPEARANCEUR Clear 05/06/2012 1546   LABSPEC 1.015 04/27/2017 0908   LABSPEC 1.011 05/06/2012 1546   PHURINE 6.0 04/27/2017 0908   GLUCOSEU NEGATIVE 04/27/2017 0908   GLUCOSEU Negative 05/06/2012 1546   HGBUR NEGATIVE 04/27/2017 0908   HGBUR negative 01/08/2009 1329   BILIRUBINUR NEGATIVE 04/27/2017 0908   BILIRUBINUR Negative 05/06/2012 1546   KETONESUR NEGATIVE 04/27/2017 0908   PROTEINUR NEGATIVE 04/27/2017 0908   UROBILINOGEN 0.2 04/27/2017 0908   NITRITE NEGATIVE 04/27/2017 0908   LEUKOCYTESUR NEGATIVE 04/27/2017 0908   LEUKOCYTESUR Negative 05/06/2012 1546   Sepsis Labs: @LABRCNTIP (procalcitonin:4,lacticidven:4) )No results found for this or any previous visit (from the past 240 hour(s)).   Radiological Exams on Admission: No results found.   Assessment/Plan Principal Problem:   Sickle cell pain crisis ([place]) Active Problems:   History of DVT  (deep vein thrombosis)    1. Sickle cell pain crisis -she has been placed on IV Dilaudid PCA weightbase.  We will continue with patient's long-acting morphine hydroxyurea.  Follow CBC LDH.  Hydration.  If right groin pain does not improve may consider x-rays. 2. History of menorrhagia -patient states she did received up for shortness last month but was still having menorrhagia and she had missed her pelvic sonogram 2 days ago.  I have ordered one for now.  Follow CBC. 3. History of DVT on Xarelto which will be continued. 4. Leukocytosis likely reactionary.  Patient is afebrile.  Closely observe. 5. Anemia secondary to sickle cell disease follow CBC.   DVT prophylaxis: Xarelto. Code Status: Full code. Family Communication: Discussed with patient. Disposition Plan: Home. Consults called: None. Admission status: Inpatient.   Rise Patience MD [facility] Pager [phone].  If 7PM-7AM, please contact night-coverage [email or web address] Password [email or web address]  05/04/2017, 9:06 PM

## 2017-05-04 NOTE — ED Notes (Signed)
ED TO INPATIENT HANDOFF REPORT  Name/Age/Gender [name] 26 y.o. female  Code Status    Code Status Orders  (From admission, onward)        Start     Ordered   05/04/17 2056  Full code  Continuous     05/04/17 2105    Code Status History    Date Active Date Inactive Code Status Order ID Comments User Context   04/01/2017 18:13 04/03/2017 18:41 Full Code [id number]  Aline August, MD ED   02/28/2017 00:45 03/05/2017 21:33 Full Code [id number]  Etta Quill, DO ED   02/02/2017 03:09 02/04/2017 22:50 Full Code [id number]  Bethena Roys, MD Inpatient   12/10/2016 04:03 12/14/2016 17:05 Full Code [id number]  Etta Quill, DO ED   11/06/2016 20:39 11/11/2016 16:59 Full Code [id number]  Patrecia Pour, MD Inpatient   10/06/2016 16:47 10/09/2016 18:09 Full Code [id number]  [name], MD ED   10/02/2016 15:12 10/05/2016 10:42 Full Code [id number]  Tresa Garter, MD Inpatient   09/15/2016 17:23 09/19/2016 20:25 Full Code [id number]  Dorena Dew, [place] Inpatient   08/20/2016 18:12 08/30/2016 16:13 Full Code [id number]  [name], MD Inpatient   07/25/2016 23:20 07/29/2016 21:26 Full Code [id number]  Norval Morton, MD ED   06/26/2016 21:54 07/02/2016 19:54 Full Code [id number]  Etta Quill, DO ED   06/10/2016 05:11 06/15/2016 15:11 Full Code [id number]  Etta Quill, DO ED   05/29/2016 20:07 05/31/2016 18:03 Full Code [id number]  Cristal Ford, DO Inpatient   05/19/2016 19:14 05/25/2016 20:05 Full Code [id number]  Charlynne Cousins, MD Inpatient   04/24/2016 10:59 05/02/2016 17:50 Full Code [id number]  Elwyn Reach, MD Inpatient   04/17/2016 21:05 04/20/2016 19:13 Full Code [id number]  Reubin Milan, MD Inpatient   04/17/2016 21:05 04/17/2016 21:05 Full Code [id number]  Reubin Milan, MD Inpatient   03/31/2016 16:38 04/07/2016 18:08 Full Code [id number]  [name], MD Inpatient   02/26/2016 18:59 03/03/2016 21:04 Full Code [id number]   [name], MD Inpatient   02/16/2016 10:33 02/17/2016 11:01 Full Code [id number]  Tresa Garter, MD Inpatient   01/30/2016 07:51 02/05/2016 19:04 Full Code [id number]  Ivor Costa, MD ED   01/05/2016 08:37 01/09/2016 17:27 Full Code [id number]  Tresa Garter, MD Inpatient   12/27/2015 03:58 12/29/2015 20:09 Full Code [id number]  Ivor Costa, MD ED   11/09/2015 01:18 11/12/2015 18:37 Full Code [id number]  Norval Morton, MD ED   10/31/2015 18:54 11/02/2015 14:56 Full Code [id number]  Manya Silvas, [place] Inpatient   10/30/2015 14:46 10/31/2015 18:54 Full Code [id number]  Lavonia Drafts, MD Inpatient   10/26/2015 21:25 10/30/2015 14:46 Full Code [id number]  Gwynne Edinger, MD Inpatient   10/15/2015 18:57 10/18/2015 20:27 Full Code [id number]  [name], MD Inpatient   10/06/2015 22:58 10/10/2015 16:23 Full Code [id number]  Lily Kocher, MD ED   09/22/2015 20:26 09/27/2015 17:35 Full Code [id number]  Osborne Oman, MD Inpatient   09/07/2015 14:31 09/14/2015 14:54 Full Code [id number]  [name], MD Inpatient   08/10/2015 14:05 08/17/2015 16:38 Full Code [id number]  [name], MD Inpatient   07/24/2015 05:31 08/02/2015 18:17 Full Code [id number]  Carlyle Dolly, MD Inpatient   07/24/2015 04:47 07/24/2015 05:31 Full Code [id number]  Carlyle Dolly, MD Inpatient   07/24/2015 03:53 07/24/2015 04:47 Full Code [id number]  Carlyle Dolly, MD Inpatient   07/12/2015 02:14 07/14/2015 20:28 Full  Code [id number]  Ivor Costa, MD ED   06/28/2015 10:37 07/03/2015 14:41 Full Code [id number]  [name], MD Inpatient   05/31/2015 14:59 06/05/2015 20:49 Full Code [id number]  [name], MD ED   05/02/2015 16:39 05/08/2015 16:44 Full Code [id number]  [name], MD Inpatient   04/14/2015 17:41 04/22/2015 20:06 Full Code [id number]  Tresa Garter, MD Inpatient   04/14/2015 17:41 04/14/2015 17:41 Full Code [id number]  Tresa Garter, MD Inpatient    04/14/2015 11:53 04/14/2015 17:41 Full Code [id number]  Tresa Garter, MD Inpatient   03/30/2015 11:04 04/03/2015 17:42 Full Code [id number]  Tresa Garter, MD Inpatient   03/22/2015 20:42 03/26/2015 17:47 Full Code [id number]  Reubin Milan, MD ED   01/25/2015 13:23 01/27/2015 15:54 Full Code [id number]  Elwyn Reach, MD Inpatient   12/07/2014 15:57 12/10/2014 17:20 Full Code [id number]  Keitha Butte, CNM Inpatient   12/05/2014 09:20 12/07/2014 15:57 Full Code [id number]  Tami Ribas, RN Inpatient   11/26/2014 22:16 12/01/2014 20:33 Full Code [id number]  Virginia Crews, MD Inpatient   11/19/2014 01:29 11/21/2014 18:12 Full Code [id number]  Seabron Spates, [place] Inpatient   10/23/2014 21:18 10/30/2014 19:32 Full Code [id number]  Truett Mainland, DO Inpatient   10/11/2014 00:10 10/14/2014 17:07 Full Code [id number]  Larey Days, [place] Inpatient   09/18/2014 15:45 09/26/2014 16:56 Full Code [id number]  Lavonia Drafts, MD Inpatient   09/08/2014 01:17 09/10/2014 18:21 Full Code [id number]  Lavonia Drafts, MD Inpatient   09/08/2014 01:17 09/08/2014 01:17 Full Code [id number]  Lavonia Drafts, MD Inpatient   08/09/2014 21:37 08/15/2014 20:48 Full Code [id number]  Tresea Mall, CNM Inpatient   07/14/2014 03:01 07/16/2014 16:53 Full Code [id number]  Brantley Persons, RN Inpatient   06/25/2014 16:51 06/28/2014 17:05 Full Code [id number]  [name], MD Inpatient   06/02/2014 01:06 06/05/2014 19:36 Full Code [id number]  Elberta Leatherwood, MD Inpatient   05/03/2014 18:18 05/06/2014 21:58 Full Code [id number]  Markus Daft, MD Inpatient   05/02/2014 12:07 05/03/2014 18:18 Full Code [id number]  [name], MD ED   04/10/2014 16:41 04/18/2014 21:17 Full Code [id number]  Greggory Keen, MD Inpatient   04/09/2014 10:28 04/10/2014 16:41 Full Code [id number]  Theodis Blaze, MD Inpatient   02/16/2014 20:20 02/25/2014 14:11 Full Code [id number]  Caren Griffins, MD ED    01/29/2014 03:43 02/01/2014 14:42 Full Code [id number]  Rise Patience, MD Inpatient   01/13/2014 18:15 01/16/2014 13:04 Full Code [id number]  Robbie Lis, MD Inpatient   11/25/2013 14:37 11/30/2013 20:09 Full Code [id number]  Verlee Monte, MD Inpatient   09/24/2013 06:15 09/27/2013 15:32 Full Code [id number]  Rise Patience, MD Inpatient   07/31/2013 03:12 08/04/2013 20:47 Full Code [id number]  Etta Quill, DO ED   07/16/2013 09:07 07/16/2013 23:10 Full Code [id number]  [name], MD Inpatient   05/28/2013 05:15 06/01/2013 21:17 Full Code [id number]  Berle Mull, MD ED   03/23/2013 04:30 03/29/2013 19:37 Full Code [id number]  Theressa Millard, MD ED   02/11/2013 08:03 02/14/2013 16:39 Full Code [id number]  Rise Patience, MD Inpatient   01/27/2013 12:57 01/30/2013 18:53 Full Code [id number]  Domenic Polite, MD Inpatient   10/05/2012 21:54 10/12/2012 21:07 Full Code [id number]  Theressa Millard, MD ED   08/27/2012 19:55 08/31/2012 18:33 Full Code [id number]  Thurnell Lose, MD Inpatient   06/22/2012 14:36 06/27/2012 14:57 Full Code [id number]  Kome, Noralee Space, RN  Inpatient   03/28/2012 21:22 04/04/2012 19:29 Full Code [id number]  Verl Dicker, PA-C ED   03/24/2012 04:32 03/25/2012 11:44 Full Code [id number]  Martie Lee, [place] ED   03/04/2012 04:48 03/15/2012 22:58 Full Code [id number]  Kennith Center, RN Inpatient   02/13/2012 06:47 02/17/2012 01:24 Full Code [id number]  Jacqualyn Posey, RN Inpatient   12/17/2011 16:14 12/22/2011 17:16 Full Code [id number]  Theodis Blaze, MD Inpatient   12/16/2011 22:05 12/17/2011 16:14 Full Code [id number]  Threasa Beards, MD ED   11/30/2011 03:09 12/03/2011 17:45 Full Code [id number]  Evelina Dun, RN ED   11/08/2011 07:23 11/14/2011 18:23 Full Code [id number]  Ron Parker, RN ED   09/05/2011 02:04 09/05/2011 07:32 Full Code [id number]  Garald Balding, NP ED   08/05/2011 07:55 08/05/2011 18:52 Full Code [id number]  Rainey Pines, RN ED   08/05/2011 00:28  08/05/2011 07:55 Full Code [id number]  Charlena Cross, MD ED   07/13/2011 11:49 07/15/2011 18:47 Full Code [id number]  Julieta Bellini, NP ED   06/09/2011 00:48 06/09/2011 09:59 Full Code [id number]  Charlena Cross, MD ED   05/31/2011 18:28 06/01/2011 18:37 Full Code [id number]  Jenel Lucks, RN Inpatient   05/26/2011 02:18 05/26/2011 14:19 Full Code [id number]  Wynetta Fines, MD ED   05/06/2011 07:00 05/08/2011 16:09 Full Code [id number]  Piloto de Tressia Miners, MD Inpatient   04/17/2011 13:29 04/19/2011 16:50 Full Code [id number]  Judithann Sheen, MD ED   03/30/2011 00:39 04/03/2011 00:25 Full Code [id number]  Hoy Morn, MD ED      Home/SNF/Other Home  Chief Complaint Leg Pain  Level of Care/Admitting Diagnosis ED Disposition    ED Disposition Condition [facility] [[id number]]  Level of Care: Med-Surg [16]  Diagnosis: Sickle cell pain crisis [facility]) [[id number]]  Admitting Physician: Rise Patience [id number]  Attending Physician: Rise Patience [phone]  Estimated length of stay: past midnight tomorrow  Certification:: I certify this patient will need inpatient services for at least 2 midnights  PT Class (Do Not Modify): Inpatient [101]  PT Acc Code (Do Not Modify): Private [1]       Medical History Past Medical History:  Diagnosis Date  . Blood transfusion    "lots"  . Blood transfusion without reported diagnosis   . Chronic back pain    "very severe; have knot in my back; from tight muscle; take RX and exercise for it"  . Depression 01/06/2011  . Exertional dyspnea    "sometimes"  . Genital HSV   . GERD (gastroesophageal reflux disease) 02/17/2011  . Migraines 11/08/11   "@ least twice/month"  . 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  . Mood swings 11/08/11   "I go back and forth; real bad"  . Sickle cell anemia ([name])   . Sickle cell anemia with crisis ([place])   . Thrombocytosis ([place]) 11/09/2015  .  Trichotillomania    h/o    Allergies Allergies  Allergen Reactions  . Food Hives and Other (See Comments)    Pt is allergic to carrots.    . Latex Rash    IV Location/Drains/Wounds Patient Lines/Drains/Airways Status   Active Line/Drains/Airways    Name:   Placement date:   Placement time:   Site:   Days:   Implanted Port Right Chest   -    -    Chest  Labs/Imaging Results for orders placed or performed during the hospital encounter of 05/04/17 (from the past 48 hour(s))  Comprehensive metabolic panel     Status: Abnormal   Collection Time: 05/04/17  4:54 PM  Result Value Ref Range   Sodium 134 (L) 135 - 145 mmol/L   Potassium 3.4 (L) 3.5 - 5.1 mmol/L   Chloride 103 101 - 111 mmol/L   CO2 25 22 - 32 mmol/L   Glucose, Bld 94 65 - 99 mg/dL   BUN 8 6 - 20 mg/dL   Creatinine, Ser 0.67 0.44 - 1.00 mg/dL   Calcium 8.8 (L) 8.9 - 10.3 mg/dL   Total Protein 7.2 6.5 - 8.1 g/dL   Albumin 4.2 3.5 - 5.0 g/dL   AST 24 15 - 41 U/L   ALT 19 14 - 54 U/L   Alkaline Phosphatase 56 38 - 126 U/L   Total Bilirubin 3.1 (H) 0.3 - 1.2 mg/dL   GFR calc non Af Amer >60 >60 mL/min   GFR calc Af Amer >60 >60 mL/min    Comment: (NOTE) The eGFR has been calculated using the CKD EPI equation. This calculation has not been validated in all clinical situations. eGFR's persistently <60 mL/min signify possible Chronic Kidney Disease.    Anion gap 6 5 - 15  CBC with Differential     Status: Abnormal   Collection Time: 05/04/17  4:54 PM  Result Value Ref Range   WBC 17.8 (H) 4.0 - 10.5 K/uL   RBC 2.78 (L) 3.87 - 5.11 MIL/uL   Hemoglobin 8.1 (L) 12.0 - 15.0 g/dL   HCT 23.8 (L) 36.0 - 46.0 %   MCV 85.6 78.0 - 100.0 fL   MCH 29.1 26.0 - 34.0 pg   MCHC 34.0 30.0 - 36.0 g/dL   RDW 19.6 (H) 11.5 - 15.5 %   Platelets 537 (H) 150 - 400 K/uL   Neutrophils Relative % 74 %   Neutro Abs 13.1 (H) 1.7 - 7.7 K/uL   Lymphocytes Relative 14 %   Lymphs Abs 2.5 0.7 - 4.0 K/uL   Monocytes Relative  10 %   Monocytes Absolute 1.7 (H) 0.1 - 1.0 K/uL   Eosinophils Relative 2 %   Eosinophils Absolute 0.4 0.0 - 0.7 K/uL   Basophils Relative 0 %   Basophils Absolute 0.1 0.0 - 0.1 K/uL  Reticulocytes     Status: Abnormal   Collection Time: 05/04/17  4:54 PM  Result Value Ref Range   Retic Ct Pct 18.5 (H) 0.4 - 3.1 %   RBC. 2.78 (L) 3.87 - 5.11 MIL/uL   Retic Count, Absolute 514.3 (H) 19.0 - 186.0 K/uL  I-Stat beta hCG blood, ED     Status: None   Collection Time: 05/04/17  5:06 PM  Result Value Ref Range   I-stat hCG, quantitative <5.0 <5 mIU/mL   Comment 3            Comment:   GEST. AGE      CONC.  (mIU/mL)   <=1 WEEK        5 - 50     2 WEEKS       50 - 500     3 WEEKS       100 - 10,000     4 WEEKS     1,000 - 30,000        FEMALE AND NON-PREGNANT FEMALE:     LESS THAN 5 mIU/mL    *Note: Due to a  large number of results and/or encounters for the requested time period, some results have not been displayed. A complete set of results can be found in Results Review.   No results found.  Pending Labs Unresulted Labs (From admission, onward)   Start     Ordered   05/05/17 0500  Lactate dehydrogenase  Tomorrow morning,   R     05/04/17 2105   05/05/17 0500  CBC with Differential/Platelet  Tomorrow morning,   R     05/04/17 2105      Vitals/Pain Today's Vitals   05/04/17 1900 05/04/17 2000 05/04/17 2030 05/04/17 2130  BP: 110/70 116/71 (!) 104/42 109/69  Pulse: (!) 106 94  84  Resp: 12 (!) 24 13 13   Temp:      TempSrc:      SpO2: (!) 88% 94%  96%  Weight:      Height:      PainSc:        Isolation Precautions No active isolations  Medications Medications  hydroxyurea (HYDREA) capsule 500 mg (not administered)  morphine (MS CONTIN) 12 hr tablet 60 mg (not administered)  multivitamin with minerals tablet 1 tablet (not administered)  rivaroxaban (XARELTO) tablet 20 mg (not administered)  senna-docusate (Senokot-S) tablet 1 tablet (not administered)  polyethylene  glycol (MIRALAX / GLYCOLAX) packet 17 g (not administered)  naloxone (NARCAN) injection 0.4 mg (not administered)    And  sodium chloride flush (NS) 0.9 % injection 9 mL (not administered)  ondansetron (ZOFRAN) injection 4 mg (not administered)  0.45 % sodium chloride infusion (not administered)  HYDROmorphone (DILAUDID) 1 mg/mL PCA injection (not administered)  ketorolac (TORADOL) 15 MG/ML injection 15 mg (not administered)  HYDROmorphone (DILAUDID) injection 2 mg (2 mg Intravenous Given 05/04/17 1645)    Or  HYDROmorphone (DILAUDID) injection 2 mg ( Subcutaneous See Alternative 05/04/17 1645)  HYDROmorphone (DILAUDID) injection 2 mg (2 mg Intravenous Given 05/04/17 1715)    Or  HYDROmorphone (DILAUDID) injection 2 mg ( Subcutaneous See Alternative 05/04/17 1715)  HYDROmorphone (DILAUDID) injection 2 mg (2 mg Intravenous Given 05/04/17 1746)    Or  HYDROmorphone (DILAUDID) injection 2 mg ( Subcutaneous See Alternative 05/04/17 1746)  diphenhydrAMINE (BENADRYL) injection 25 mg (25 mg Intravenous Given 05/04/17 1657)  ketorolac (TORADOL) 30 MG/ML injection 15 mg (15 mg Intravenous Given 05/04/17 1658)  diphenhydrAMINE (BENADRYL) injection 25 mg (25 mg Intravenous Given 05/04/17 1846)    Mobility walks

## 2017-05-04 NOTE — ED Provider Notes (Signed)
[facility] DEPT Provider Note   CSN: [id number] Arrival date & time: 05/04/17  1431     History   Chief Complaint Chief Complaint  Patient presents with  . Sickle Cell Pain Crisis    HPI [name] is a 26 y.o. female.  26 year old female presents with pain to her right hip which radiates to her right flank consistent with her prior sickle cell crisis.  Denies any trauma.  Denies any urinary symptoms.  No lower extremity swelling or edema.  No recent fever or chills.  No cough or congestion.  Pain is dull and worse with movement.  Has been unrelieved with her home opiates.  Denies any lower back pain.  Nothing makes her symptoms better      Past Medical History:  Diagnosis Date  . Blood transfusion    "lots"  . Blood transfusion without reported diagnosis   . Chronic back pain    "very severe; have knot in my back; from tight muscle; take RX and exercise for it"  . Depression 01/06/2011  . Exertional dyspnea    "sometimes"  . Genital HSV   . GERD (gastroesophageal reflux disease) 02/17/2011  . Migraines 11/08/11   "@ least twice/month"  . 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  . Mood swings 11/08/11   "I go back and forth; real bad"  . Sickle cell anemia ([name])   . Sickle cell anemia with crisis ([place])   . Thrombocytosis ([place]) 11/09/2015  . Trichotillomania    h/o    Patient Active Problem List   Diagnosis Date Noted  . Leukocytosis   . Chronic anticoagulation   . Sickle cell anemia ([place]) 05/19/2016  . History of DVT (deep vein thrombosis) 04/17/2016  . Thrombocytosis ([place]) 11/09/2015  . Hyperbilirubinemia 11/09/2015  . Chronic pain 08/04/2015  . Herpes simplex 07/14/2015  . Anemia of chronic disease   . 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 CHOLECYSTECTOMY  05/2010  .  DILATION AND CURETTAGE OF UTERUS  02/20/11   S/P miscarriage  . IR GENERIC HISTORICAL  12/23/2015   IR FLUORO GUIDE CV LINE RIGHT 12/23/2015 Jacqulynn Cadet, MD [facility]-INTERV RAD  . IR GENERIC HISTORICAL  12/23/2015   IR US GUIDE VASC ACCESS RIGHT 12/23/2015 Jacqulynn Cadet, MD [facility]-INTERV RAD    OB History    Gravida Para Term Preterm AB Living   5 2 2  0 3 2   SAB TAB Ectopic Multiple Live Births   3 0 0 0 2      Obstetric Comments   Miscarried in October 2012 at about 7 weeks       Home Medications    Prior to Admission medications   Medication Sig Start Date End Date Taking? Authorizing Provider  acetaminophen (TYLENOL) 500 MG tablet Take 1,000 mg every 6 (six) hours as needed by mouth for moderate pain.    [provider]  aspirin-acetaminophen-caffeine (EXCEDRIN MIGRAINE) [id number] MG tablet Take 2 tablets every 6 (six) hours as needed by mouth for headache.    [provider]  Cyanocobalamin-Caffeine (ENERGY CHEWS PO) Take 2 tablets daily by mouth.    [provider]  folic acid (FOLVITE) 1 MG tablet Take 1 tablet (1 mg total) by mouth daily. 04/27/17   Scot Jun, FNP  hydroxyurea (HYDREA) 500 MG capsule Take 1 capsule (500 mg  total) by mouth 2 (two) times daily. May take with food to minimize GI side effects. 04/27/17   Scot Jun, FNP  medroxyPROGESTERone (DEPO-PROVERA) 150 MG/ML injection Inject 1 mL (150 mg total) into the muscle every 3 (three) months. 09/21/16   Shelly Bombard, MD  morphine (MS CONTIN) 60 MG 12 hr tablet Take 60 mg by mouth every 12 (twelve) hours as needed for pain.     [provider]  Multiple Vitamin (MULTIVITAMIN WITH MINERALS) TABS tablet Take 1 tablet daily by mouth.    [provider]  Multiple Vitamins-Minerals (HAIR SKIN AND NAILS FORMULA PO) Take 2 tablets daily by mouth.    [provider]  Oxycodone HCl 20 MG TABS Take 20 mg every 4 (four) hours as needed by mouth (pain).  01/17/17    [provider]  rivaroxaban (XARELTO) 20 MG TABS tablet Take 1 tablet (20 mg total) by mouth daily with supper. 04/27/17   Scot Jun, FNP    Family History Family History  Problem Relation Age of Onset  . Sickle cell trait Mother   . Sickle cell trait Father   . Diabetes Maternal Grandmother   . Diabetes Paternal Grandmother   . Hypertension Paternal Grandmother   . Diabetes Maternal Grandfather     Social History Social History   Tobacco Use  . Smoking status: Former Smoker    Packs/day: 0.25    Years: 1.00    Pack years: 0.25    Types: Cigarettes    Last attempt to quit: 03/25/2013    Years since quitting: 4.1  . Smokeless tobacco: Never Used  Substance Use Topics  . Alcohol use: No    Comment: denies  . Drug use: No    Comment: "Clean for 3 months"     Allergies   Food and Latex   Review of Systems Review of Systems  All other systems reviewed and are negative.    Physical Exam Updated Vital Signs BP 126/77 (BP Location: Right Arm)   Pulse 100   Temp 98.2 F (36.8 C) (Oral)   Resp 18   Ht 1.549 m (5\' 1" )   Wt 94.8 kg (209 lb)   SpO2 99%   BMI 39.49 kg/m   Physical Exam  Constitutional: She is oriented to person, place, and time. She appears well-developed and well-nourished.  Non-toxic appearance. No distress.  HENT:  Head: Normocephalic and atraumatic.  Eyes: Conjunctivae, EOM and lids are normal. Pupils are equal, round, and reactive to light.  Neck: Normal range of motion. Neck supple. No tracheal deviation present. No thyroid mass present.  Cardiovascular: Normal rate, regular rhythm and normal heart sounds. Exam reveals no gallop.  No murmur heard. Pulmonary/Chest: Effort normal and breath sounds normal. No stridor. No respiratory distress. She has no decreased breath sounds. She has no wheezes. She has no rhonchi. She has no rales.  Abdominal: Soft. Normal appearance and bowel sounds are normal. She exhibits no distension.  There is no tenderness. There is no rebound and no CVA tenderness.  Musculoskeletal: Normal range of motion. She exhibits no edema or tenderness.       Legs: Neurological: She is alert and oriented to person, place, and time. She has normal strength. No cranial nerve deficit or sensory deficit. GCS eye subscore is 4. GCS verbal subscore is 5. GCS motor subscore is 6.  Skin: Skin is warm and dry. No abrasion and no rash noted.  Psychiatric: She has a normal mood  and affect. Her speech is normal and behavior is normal.  Nursing note and vitals reviewed.    ED Treatments / Results  Labs (all labs ordered are listed, but only abnormal results are displayed) Labs Reviewed  COMPREHENSIVE METABOLIC PANEL  CBC WITH DIFFERENTIAL/PLATELET  RETICULOCYTES  I-STAT BETA HCG BLOOD, ED ([facility], [facility], [facility] ONLY)    EKG  EKG Interpretation None       Radiology No results found.  Procedures Procedures (including critical care time)  Medications Ordered in ED Medications  HYDROmorphone (DILAUDID) injection 2 mg (not administered)    Or  HYDROmorphone (DILAUDID) injection 2 mg (not administered)  HYDROmorphone (DILAUDID) injection 2 mg (not administered)    Or  HYDROmorphone (DILAUDID) injection 2 mg (not administered)  HYDROmorphone (DILAUDID) injection 2 mg (not administered)    Or  HYDROmorphone (DILAUDID) injection 2 mg (not administered)  HYDROmorphone (DILAUDID) injection 2 mg (not administered)    Or  HYDROmorphone (DILAUDID) injection 2 mg (not administered)  ondansetron (ZOFRAN) injection 4 mg (not administered)  diphenhydrAMINE (BENADRYL) injection 25 mg (not administered)  0.45 % sodium chloride infusion (not administered)  ketorolac (TORADOL) 30 MG/ML injection 15 mg (not administered)     Initial Impression / Assessment and Plan / ED Course  I have reviewed the triage vital signs and the nursing notes.  Pertinent labs & imaging results that were available during my care of  the patient were reviewed by me and considered in my medical decision making (see chart for details).     Patient given 3 doses of IV hydromorphone here.  Also given Toradol as well.  Continues to complain of severe pain.  Will admit for pain crisis  Final Clinical Impressions(s) / ED Diagnoses   Final diagnoses:  None    ED Discharge Orders    None       Lacretia Leigh, MD 05/04/17 Lurena Nida

## 2017-05-04 NOTE — ED Triage Notes (Signed)
Pt complaint of right groin/leg pain consistent with SCC onset 0200 this am; no relief from pain medications at home.

## 2017-05-05 ENCOUNTER — Ambulatory Visit: Payer: Self-pay | Admitting: Family Medicine

## 2017-05-05 ENCOUNTER — Other Ambulatory Visit: Payer: Self-pay

## 2017-05-05 ENCOUNTER — Inpatient Hospital Stay (HOSPITAL_COMMUNITY): Payer: Medicare Other

## 2017-05-05 DIAGNOSIS — D5700 Hb-SS disease with crisis, unspecified: Principal | ICD-10-CM

## 2017-05-05 DIAGNOSIS — N920 Excessive and frequent menstruation with regular cycle: Secondary | ICD-10-CM

## 2017-05-05 DIAGNOSIS — Z86718 Personal history of other venous thrombosis and embolism: Secondary | ICD-10-CM

## 2017-05-05 LAB — CBC WITH DIFFERENTIAL/PLATELET
BASOS ABS: 0 10*3/uL (ref 0.0–0.1)
BASOS PCT: 0 %
EOS ABS: 0.6 10*3/uL (ref 0.0–0.7)
EOS PCT: 4 %
HCT: 21.7 % — ABNORMAL LOW (ref 36.0–46.0)
Hemoglobin: 7.6 g/dL — ABNORMAL LOW (ref 12.0–15.0)
Lymphocytes Relative: 21 %
Lymphs Abs: 3.5 10*3/uL (ref 0.7–4.0)
MCH: 29.3 pg (ref 26.0–34.0)
MCHC: 35 g/dL (ref 30.0–36.0)
MCV: 83.8 fL (ref 78.0–100.0)
Monocytes Absolute: 1.9 10*3/uL — ABNORMAL HIGH (ref 0.1–1.0)
Monocytes Relative: 11 %
Neutro Abs: 10.7 10*3/uL — ABNORMAL HIGH (ref 1.7–7.7)
Neutrophils Relative %: 64 %
PLATELETS: 450 10*3/uL — AB (ref 150–400)
RBC: 2.59 MIL/uL — AB (ref 3.87–5.11)
RDW: 19.6 % — ABNORMAL HIGH (ref 11.5–15.5)
WBC: 16.8 10*3/uL — AB (ref 4.0–10.5)

## 2017-05-05 LAB — LACTATE DEHYDROGENASE: LDH: 224 U/L — AB (ref 98–192)

## 2017-05-05 MED ORDER — DIPHENHYDRAMINE HCL 25 MG PO CAPS: 25.0000 mg | ORAL_CAPSULE | Freq: Four times a day (QID) | ORAL | Status: DC | PRN

## 2017-05-05 MED ORDER — MUSCLE RUB 10-15 % EX CREA
TOPICAL_CREAM | CUTANEOUS | Status: DC | PRN
  Filled 2017-05-05: qty 85

## 2017-05-05 NOTE — Progress Notes (Signed)
Patient ID: [name], female   DOB: 07-Sep-1990, 26 y.o.   MRN: [id number] Subjective:  Patient has no new complains or concern. She said her pain is down to a 7 from 10. Her goal is 4. She denies any fever, no chest pain. No SOB.   Objective:  Vital signs in last 24 hours:  Vitals:   05/05/17 1042 05/05/17 1124 05/05/17 1125 05/05/17 1352  BP:  123/85  116/79  Pulse:  99  (!) 106  Resp: 13 12 19 18   Temp:  98.4 F (36.9 C)  99.1 F (37.3 C)  TempSrc:  Oral  Oral  SpO2: 92% 94% 92% 97%  Weight:      Height:        Intake/Output from previous day:   Intake/Output Summary (Last 24 hours) at 05/05/2017 1443 Last data filed at 05/05/2017 0300 Gross per 24 hour  Intake 400 ml  Output -  Net 400 ml   Physical Exam: General: Alert, awake, oriented x3, in no acute distress.  HEENT: [place]/AT PEERL, EOMI Neck: Trachea midline,  no masses, no thyromegal,y no JVD, no carotid bruit OROPHARYNX:  Moist, No exudate/ erythema/lesions.  Heart: Regular rate and rhythm, without murmurs, rubs, gallops, PMI non-displaced, no heaves or thrills on palpation.  Lungs: Clear to auscultation, no wheezing or rhonchi noted. No increased vocal fremitus resonant to percussion  Abdomen: Soft, nontender, nondistended, positive bowel sounds, no masses no hepatosplenomegaly noted..  Neuro: No focal neurological deficits noted cranial nerves II through XII grossly intact. DTRs 2+ bilaterally upper and lower extremities. Strength 5 out of 5 in bilateral upper and lower extremities. Musculoskeletal: No warm swelling or erythema around joints, no spinal tenderness noted. Psychiatric: Patient alert and oriented x3, good insight and cognition, good recent to remote recall. Lymph node survey: No cervical axillary or inguinal lymphadenopathy noted.  Lab Results:  Basic Metabolic Panel:    Component Value Date/Time   NA 134 (L) 05/04/2017 1654   NA 140 04/27/2017 0944   NA 139 05/06/2012 1645   K 3.4 (L)  05/04/2017 1654   K 3.9 05/06/2012 1645   CL 103 05/04/2017 1654   CL 110 (H) 05/06/2012 1645   CO2 25 05/04/2017 1654   CO2 22 05/06/2012 1645   BUN 8 05/04/2017 1654   BUN 6 04/27/2017 0944   BUN 9 05/06/2012 1645   CREATININE 0.67 05/04/2017 1654   CREATININE 0.43 (L) 07/06/2015 1356   GLUCOSE 94 05/04/2017 1654   GLUCOSE 84 05/06/2012 1645   CALCIUM 8.8 (L) 05/04/2017 1654   CALCIUM 8.3 (L) 05/06/2012 1645   CBC:    Component Value Date/Time   WBC 16.8 (H) 05/05/2017 0422   HGB 7.6 (L) 05/05/2017 0422   HGB 8.6 (L) 04/27/2017 0944   HCT 21.7 (L) 05/05/2017 0422   HCT 26.1 (L) 04/27/2017 0944   PLT 450 (H) 05/05/2017 0422   PLT 596 (H) 04/27/2017 0944   MCV 83.8 05/05/2017 0422   MCV 90 04/27/2017 0944   MCV 94 05/11/2012 0721   NEUTROABS 10.7 (H) 05/05/2017 0422   NEUTROABS 8.8 (H) 04/27/2017 0944   NEUTROABS 10.9 (H) 05/10/2012 0552   LYMPHSABS 3.5 05/05/2017 0422   LYMPHSABS 1.6 04/27/2017 0944   LYMPHSABS 3.5 05/10/2012 0552   MONOABS 1.9 (H) 05/05/2017 0422   MONOABS 1.7 (H) 05/10/2012 0552   EOSABS 0.6 05/05/2017 0422   EOSABS 0.3 04/27/2017 0944   EOSABS 0.8 (H) 05/10/2012 0552   BASOSABS 0.0 05/05/2017 0422  BASOSABS 0.0 04/27/2017 0944   BASOSABS 0.1 05/10/2012 0552    No results found for this or any previous visit (from the past 240 hour(s)).  Studies/Results: No results found.  Medications: Scheduled Meds: . HYDROmorphone   Intravenous Q4H  . hydroxyurea  500 mg Oral BID  . ketorolac  15 mg Intravenous Q6H  . morphine  60 mg Oral Q12H  . multivitamin with minerals  1 tablet Oral Daily  . rivaroxaban  20 mg Oral Q supper  . senna-docusate  1 tablet Oral BID   Continuous Infusions: . sodium chloride 100 mL/hr at 05/04/17 2300   PRN Meds:.diphenhydrAMINE, MUSCLE RUB, naloxone **AND** sodium chloride flush, ondansetron (ZOFRAN) IV, polyethylene glycol, sodium chloride flush  Assessment/Plan: Principal Problem:   Sickle cell pain crisis  ([place]) Active Problems:   History of DVT (deep vein thrombosis)  1. Hb SS with crisis: Continue IV Dilaudid via PCA, patient said this is working for her and pain is gradually getting better. Continue IVF and other medications. Repeat labs in am 2. Leukocytosis: Likely reactionary from sickle cell crisis, will monitor 3. Anemia: Hb currently at baseline. No indication for blood transfusion at the moment 4. Chronic pain: Continue home pain medications. Patient has been tryin g to wean herself off narcotics but obviously very difficult 5. Menorrhagia: Patient has been on her period for the past 2 months according to her. Pelvic USS ordered. Will follow.  Code Status: Full Code Family Communication: N/A Disposition Plan: Not yet ready for discharge  Orel Hord  If 7PM-7AM, please contact night-coverage.  05/05/2017, 2:43 PM  LOS: 1 day

## 2017-05-05 NOTE — Treatment Plan (Signed)
Called regarding request for IV benadryl for itching.  Discussed with nursing and instructed that she'll need to continue PO benadryl as she's able to take PO.  Recommended discussing with AM doc.

## 2017-05-05 NOTE — Progress Notes (Signed)
Patient just came out to the hall way and said to me and I quote " You can call [facility] and they can't do anything because first I am a sickle cell patient and I'm in a crisis and if they touch me they are gonna get sued. So I'm not going anywhere".

## 2017-05-05 NOTE — Progress Notes (Signed)
Called report in to [place], Therapist, sports on 5E

## 2017-05-05 NOTE — Progress Notes (Signed)
Pt was transferred to the unit. She was agitated when she got off the elevator. She states that she feels like sickle cell patients are mistreated because they're constantly in need of care. She feels as though the system disregards sickle cell patients' pain and her "voice isn't being heard". She has calmed down as I did my assessment but she still has agitation and anxiety.

## 2017-05-06 MED ORDER — SODIUM CHLORIDE 0.45 % IV SOLN
INTRAVENOUS | Status: DC
  Administered 2017-05-06: 04:00:00 via INTRAVENOUS

## 2017-05-06 MED ORDER — HYDROCODONE-ACETAMINOPHEN 10-325 MG PO TABS: 1.0000 | ORAL_TABLET | Freq: Once | ORAL | Status: DC

## 2017-05-06 NOTE — Progress Notes (Signed)
Subjective: A 26 year old female admitted with sickle cell painful crisis.patient's pain is down to 7 out of 10.  Denied any nausea vomiting or diarrhea.  She is on Dilaudid PCA and is taking her medicine with 32 mg in the last 24 was.  Objective: Vital signs in last 24 hours: Temp:  [98.4 F (36.9 C)-99.1 F (37.3 C)] 98.7 F (37.1 C) (12/22 0700) Pulse Rate:  [97-106] 97 (12/22 0700) Resp:  [14-18] 17 (12/22 1200) BP: (110-126)/(61-81) 110/77 (12/22 0700) SpO2:  [93 %-99 %] 93 % (12/22 1200) Weight change:  Last BM Date: 05/03/17  Intake/Output from previous day: 12/21 0701 - 12/22 0700 In: 157.5 [P.O.:120; I.V.:37.5] Out: -  Intake/Output this shift: No intake/output data recorded.  General appearance: alert, cooperative, appears stated age and no distress Back: symmetric, no curvature. ROM normal. No CVA tenderness. Resp: clear to auscultation bilaterally Cardio: regular rate and rhythm, S1, S2 normal, no murmur, click, rub or gallop GI: soft, non-tender; bowel sounds normal; no masses,  no organomegaly Extremities: extremities normal, atraumatic, no cyanosis or edema Pulses: 2+ and symmetric Neurologic: Grossly normal  Lab Results: Recent Labs    05/04/17 1654 05/05/17 0422  WBC 17.8* 16.8*  HGB 8.1* 7.6*  HCT 23.8* 21.7*  PLT 537* 450*   BMET Recent Labs    05/04/17 1654  NA 134*  K 3.4*  CL 103  CO2 25  GLUCOSE 94  BUN 8  CREATININE 0.67  CALCIUM 8.8*    Studies/Results: US Pelvis Transvanginal Non-ob (tv Only)  Result Date: 05/05/2017 CLINICAL DATA:  Menorrhagia EXAM: ULTRASOUND PELVIS TRANSVAGINAL TECHNIQUE: Transvaginal ultrasound examination of the pelvis was performed including evaluation of the uterus, ovaries, adnexal regions, and pelvic cul-de-sac. COMPARISON:  04/03/2016 FINDINGS: Uterus Measurements: Retroverted, 5.5 x 3.4 x 4.4 cm. No fibroids or other mass visualized. Endometrium Thickness: 3 mm in thickness.  No focal abnormality  visualized. Right ovary Measurements: 3.0 x 2.4 x 1.8 cm. Normal appearance/no adnexal mass. Left ovary Measurements: 2.6 x 1.2 x 1.5 cm. Normal appearance/no adnexal mass. Other findings:  No abnormal free fluid IMPRESSION: Unremarkable transvaginal pelvic ultrasound. Electronically Signed   By: Rolm Baptise M.D.   On: 05/05/2017 16:29    Medications: I have reviewed the patient's current medications.  Assessment/Plan: A 26 year old female admitted with sickle cell painful crisis.  #1 sickle cell painful crisis: continue current regimen at PCA with Toradol and IV fluids.  She has been transfused yesterday due to menorrhagia.  Monitor patient closely  #2 sickle cell anemia: Patient's hemoglobin has dropped.  She is receiving transfusion and will be discharged if hemoglobin stays stable it is possibly worsened due to menorrhagia  #3 menorrhagia: Patient has had ultrasound of the uterus with no significant findings.  This has slowed down.  She will continue close follow  #4 leukocytosis: This appears to be resolved.  #5 chronic pain syndrome: continue home medications.   LOS: 2 days   Railynn Ballo,LAWAL 05/06/2017, 12:37 PM

## 2017-05-07 LAB — COMPREHENSIVE METABOLIC PANEL
ALBUMIN: 3.8 g/dL (ref 3.5–5.0)
ALT: 34 U/L (ref 14–54)
ANION GAP: 5 (ref 5–15)
AST: 31 U/L (ref 15–41)
Alkaline Phosphatase: 57 U/L (ref 38–126)
BUN: 9 mg/dL (ref 6–20)
CHLORIDE: 107 mmol/L (ref 101–111)
CO2: 27 mmol/L (ref 22–32)
Calcium: 8.6 mg/dL — ABNORMAL LOW (ref 8.9–10.3)
Creatinine, Ser: 0.72 mg/dL (ref 0.44–1.00)
GFR calc non Af Amer: >60 mL/min (ref >60)
Glucose, Bld: 104 mg/dL — ABNORMAL HIGH (ref 65–99)
Potassium: 3.7 mmol/L (ref 3.5–5.1)
SODIUM: 139 mmol/L (ref 135–145)
Total Bilirubin: 2.9 mg/dL — ABNORMAL HIGH (ref 0.3–1.2)
Total Protein: 6.9 g/dL (ref 6.5–8.1)

## 2017-05-07 LAB — CBC WITH DIFFERENTIAL/PLATELET
BASOS ABS: 0.2 10*3/uL — AB (ref 0.0–0.1)
BASOS PCT: 1 %
EOS ABS: 0.8 10*3/uL — AB (ref 0.0–0.7)
Eosinophils Relative: 5 %
HCT: 18.9 % — ABNORMAL LOW (ref 36.0–46.0)
Hemoglobin: 6.6 g/dL — CL (ref 12.0–15.0)
Lymphocytes Relative: 18 %
Lymphs Abs: 3 10*3/uL (ref 0.7–4.0)
MCH: 29.7 pg (ref 26.0–34.0)
MCHC: 34.9 g/dL (ref 30.0–36.0)
MCV: 85.1 fL (ref 78.0–100.0)
MONO ABS: 1.5 10*3/uL — AB (ref 0.1–1.0)
Monocytes Relative: 9 %
NEUTROS PCT: 67 %
Neutro Abs: 11.2 10*3/uL — ABNORMAL HIGH (ref 1.7–7.7)
PLATELETS: 534 10*3/uL — AB (ref 150–400)
RBC: 2.22 MIL/uL — ABNORMAL LOW (ref 3.87–5.11)
RDW: 21.3 % — ABNORMAL HIGH (ref 11.5–15.5)
WBC: 16.7 10*3/uL — AB (ref 4.0–10.5)

## 2017-05-07 LAB — RETICULOCYTES
RBC.: 2.26 MIL/uL — AB (ref 3.87–5.11)
Retic Ct Pct: >23 % — ABNORMAL HIGH (ref 0.4–3.1)

## 2017-05-07 MED ORDER — HEPARIN SOD (PORK) LOCK FLUSH 100 UNIT/ML IV SOLN
500.0000 [IU] | INTRAVENOUS | Status: AC | PRN
  Administered 2017-05-07: 500 [IU]

## 2017-05-07 NOTE — Progress Notes (Signed)
2 mL wasted PCA Dilaudid with Haywood Lasso, RN.

## 2017-05-07 NOTE — Progress Notes (Signed)
Discharge instructions and medications discussed with patient.  AVS given to patient.  All questions answered.  

## 2017-05-07 NOTE — Progress Notes (Signed)
CRITICAL VALUE ALERT  Critical Value:  Hgb 6.6  Date & Time Notied:  05/07/17 0609am  Provider Notified: On call provider  Orders Received/Actions taken: Notified patient's nurse of critical lab and provider paged.

## 2017-05-07 NOTE — Discharge Summary (Signed)
Physician Discharge Summary  Patient ID: [name] MRN: [id number] DOB/AGE: [birth year] 26 y.o.  Admit date: 05/04/2017 Discharge date: 05/07/2017  Admission Diagnoses:  Discharge Diagnoses:  Principal Problem:   Sickle cell pain crisis ([place]) Active Problems:   Sickle cell crisis ([place])   History of DVT (deep vein thrombosis)   Menorrhagia   Discharged Condition: good  Hospital Course: patient admitted with sickle cell painful crisis.  She was also having significant menorrhagia leading to blood loss anemia in addition to her chronic anemia.  She was treated with IV Dilaudid PCA was Toradol and her long-acting morphine.  Patient was also transfused 1 unit of packed red blood cells due to significant drop in her hemoglobin.  She had uterine ultrasound that showed no abnormal findings.  Patient also was hydrated.  She was finally transitioned to her home regimen of oral medications at time of discharge.  She has done much better I will follow up with PCP.  Hemoglobin has also improved to 6.5  Consults: None  Significant Diagnostic Studies: labs: serial CBCs was CMP's and uterine ultrasound done.  Treatments: IV hydration, analgesia: Dilaudid and transfusion of 1 unit packed red blood cells  Discharge Exam: Blood pressure 132/86, pulse 96, temperature 98.4 F (36.9 C), temperature source Oral, resp. rate 14, height 5\' 1"  (1.549 m), weight 94.9 kg (209 lb 3.5 oz), SpO2 99 %, not currently breastfeeding. General appearance: alert, cooperative, appears stated age and no distress Neck: no adenopathy, no carotid bruit, no JVD, supple, symmetrical, trachea midline and thyroid not enlarged, symmetric, no tenderness/mass/nodules Resp: clear to auscultation bilaterally Cardio: regular rate and rhythm, S1, S2 normal, no murmur, click, rub or gallop GI: soft, non-tender; bowel sounds normal; no masses,  no organomegaly Extremities: extremities normal, atraumatic, no cyanosis or  edema Pulses: 2+ and symmetric Skin: Skin color, texture, turgor normal. No rashes or lesions Neurologic: Grossly normal  Disposition: 01-Home or Self Care  Discharge Instructions    Diet - low sodium heart healthy   Complete by:  As directed    Increase activity slowly   Complete by:  As directed      Allergies as of 05/07/2017      Reactions   Food Hives, Other (See Comments)   Pt is allergic to carrots.     Latex Rash      Medication List    TAKE these medications   aspirin-acetaminophen-caffeine 250-250-65 MG tablet Commonly known as:  EXCEDRIN MIGRAINE Take 2 tablets every 6 (six) hours as needed by mouth for headache.   ENERGY CHEWS PO Take 2 tablets daily by mouth.   folic acid 1 MG tablet Commonly known as:  FOLVITE Take 1 tablet (1 mg total) by mouth daily.   HAIR SKIN AND NAILS FORMULA PO Take 2 tablets daily by mouth.   hydroxyurea 500 MG capsule Commonly known as:  HYDREA Take 1 capsule (500 mg total) by mouth 2 (two) times daily. May take with food to minimize GI side effects.   medroxyPROGESTERone 150 MG/ML injection Commonly known as:  DEPO-PROVERA Inject 1 mL (150 mg total) into the muscle every 3 (three) months.   morphine 60 MG 12 hr tablet Commonly known as:  MS CONTIN Take 60 mg by mouth every 12 (twelve) hours as needed for pain.   multivitamin with minerals Tabs tablet Take 1 tablet daily by mouth.   Oxycodone HCl 20 MG Tabs Take 20 mg every 4 (four) hours as needed by mouth (pain).  rivaroxaban 20 MG Tabs tablet Commonly known as:  XARELTO Take 1 tablet (20 mg total) by mouth daily with supper.        SignedBarbette Merino 05/07/2017, 4:50 PM  Time spent 33 minutes

## 2017-05-11 ENCOUNTER — Telehealth: Payer: Self-pay | Admitting: Family Medicine

## 2017-05-11 ENCOUNTER — Telehealth: Payer: Self-pay

## 2017-05-11 IMAGING — CR DG CHEST 2V
2 series · 2 of 2 positions shown · non-contrast
Comparison: June 10, 2016

CLINICAL DATA: Chest soreness for 1 day.  Sickle cell anemia.

EXAM:
CHEST  2 VIEW

[w chest lat]
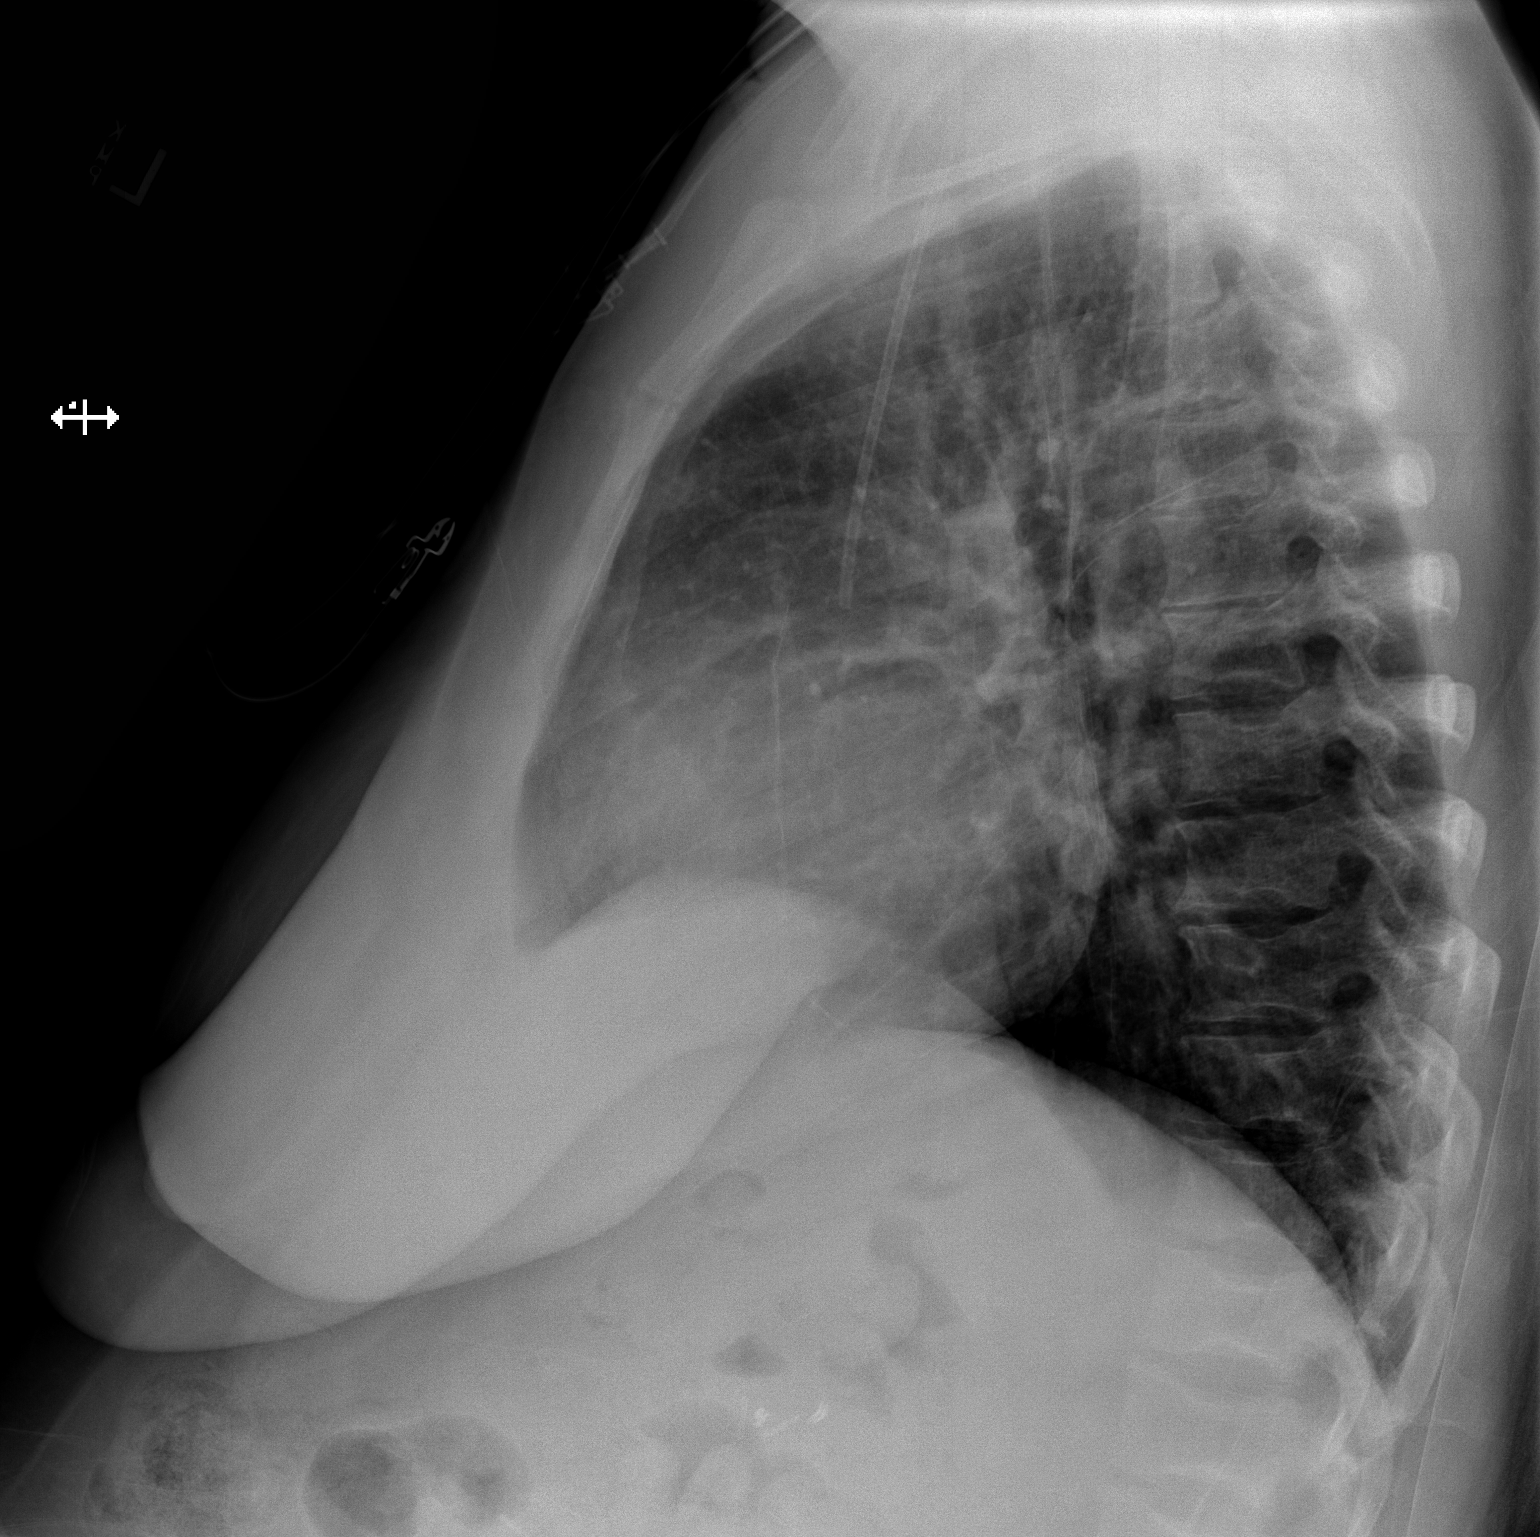

[x chest ap]
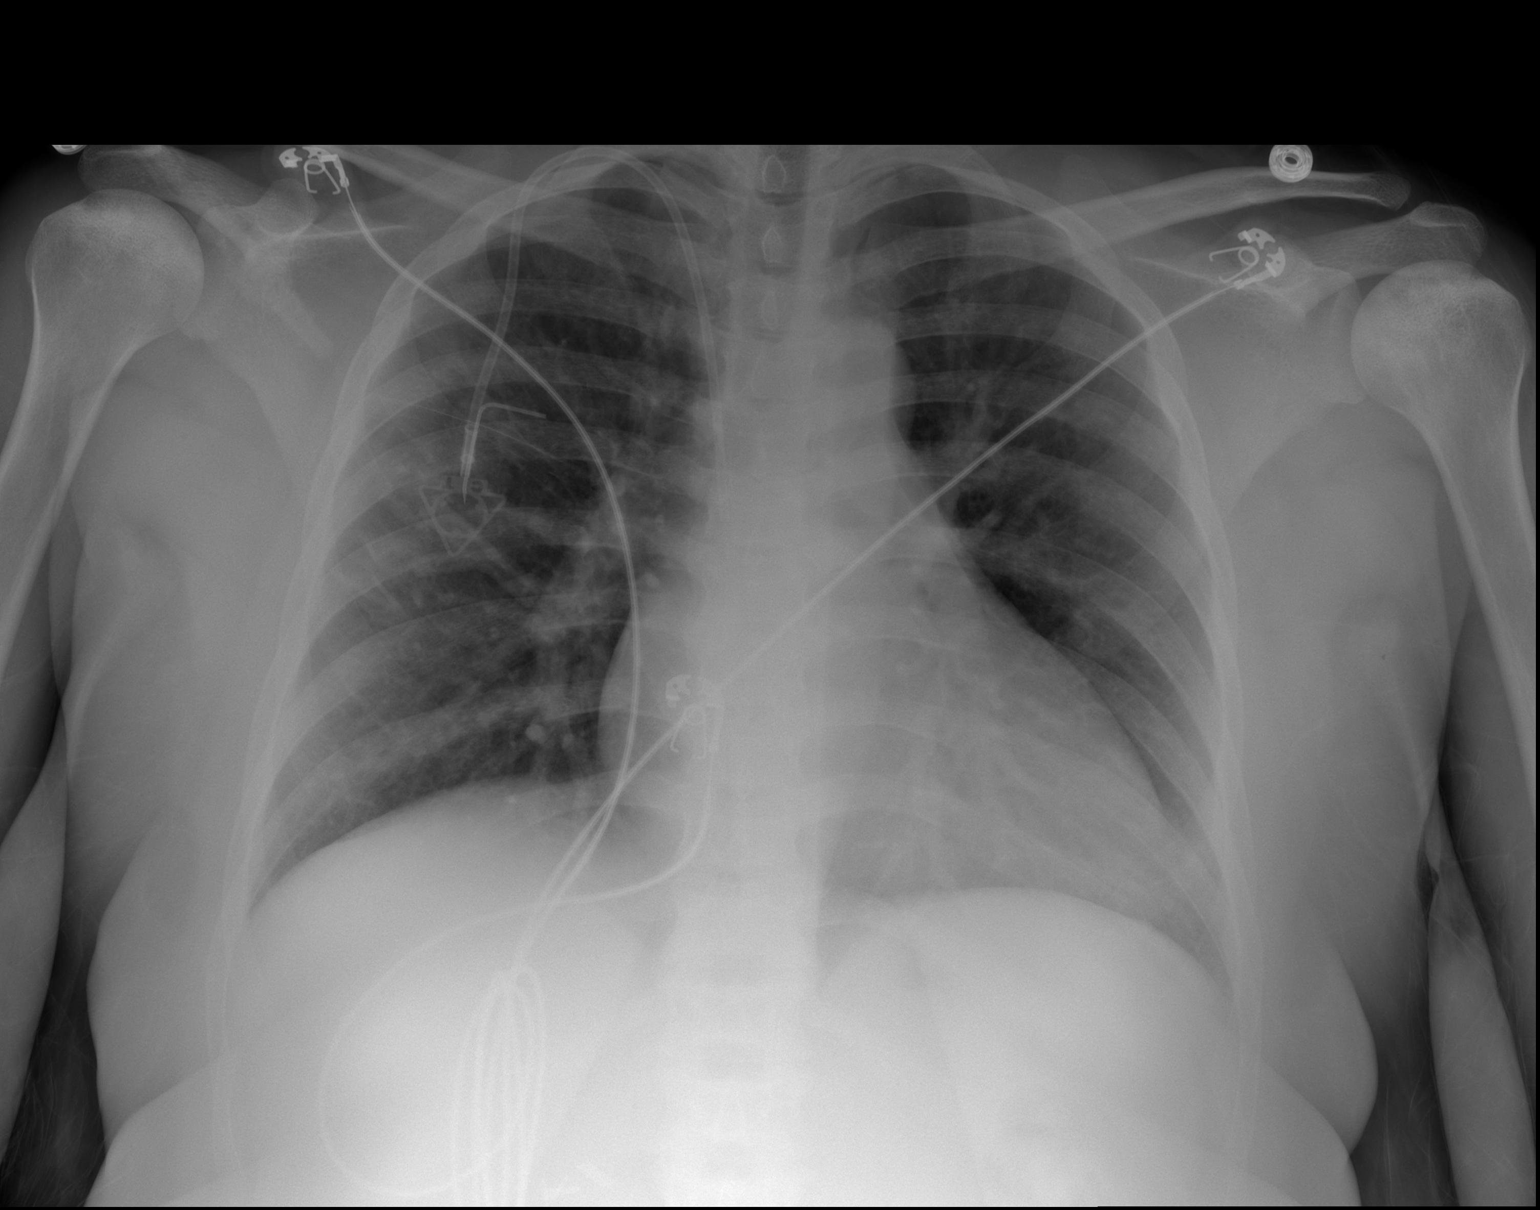

[2 of 2 positions shown; findings below may reference images not displayed]

FINDINGS: Bony sclerosis, most prominent in the humeral head stomach
consistent with known sickle cell. No pneumothorax. The heart size
is mildly enlarged. The hila and mediastinum are normal. Mild
opacity in the right mid lung not seen previously. No other acute
pulmonary abnormalities. Right Port-A-Cath is in good position.
IMPRESSION: 1. Right mid lung infiltrate.  Recommend follow-up to resolution.
2. Sickle cell changes to the bones.

## 2017-05-11 NOTE — Telephone Encounter (Signed)
Received a message from staff that patient is experiencing weakness and is concerned she needs a blood transfusion as her hemoglobin was low during a recent impatient admission. Patient has SCD.  I am out of the office today however I did advise staff to let the patient know that if she is experiencing some worsening weakness she needs to be evaluated in the ED today.    Carroll Sage. Kenton Kingfisher, MSN, FNP-C [facility]  [address] [name] [place], [place] [address] [phone]

## 2017-05-11 NOTE — Telephone Encounter (Signed)
Patient was in the ED on 05/04/2017 and states that her A1C was 6. Patient states that she feels weak and tired and wants to know if she should come in for a transfusion. Spoke with the provider and was told to tell patient that she needs to go to ED for evaluation and transfusion. Spoke with patient and told her to go to ED for evaluation and she agrees with plan.

## 2017-05-13 IMAGING — DX DG NECK SOFT TISSUE
2 series · 3 of 3 positions shown · non-contrast
Comparison: MRI 06/13/2016.  CT 06/10/2016.

CLINICAL DATA: Neck pain.

EXAM:
NECK SOFT TISSUES - 1+ VIEW

[Series 1: neck lat · 0.14mm/px · 2 of 2 slices shown]
[im 1/2]
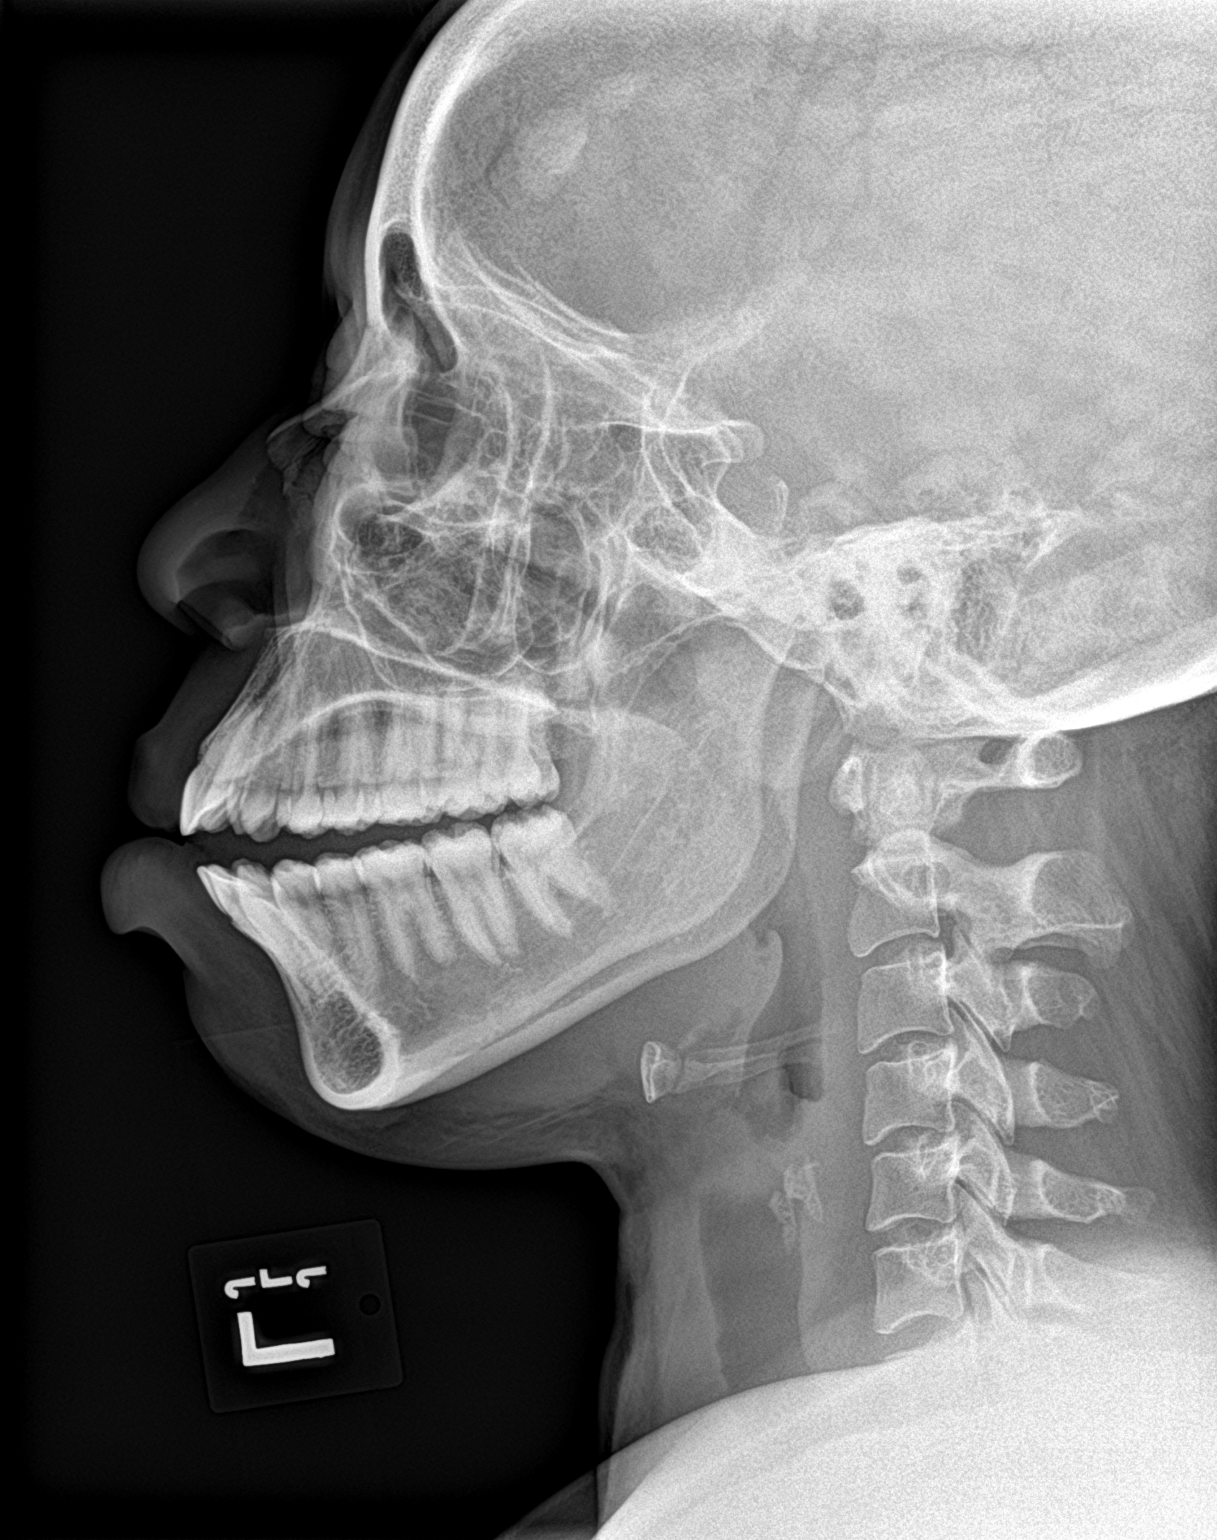
[im 2/2]
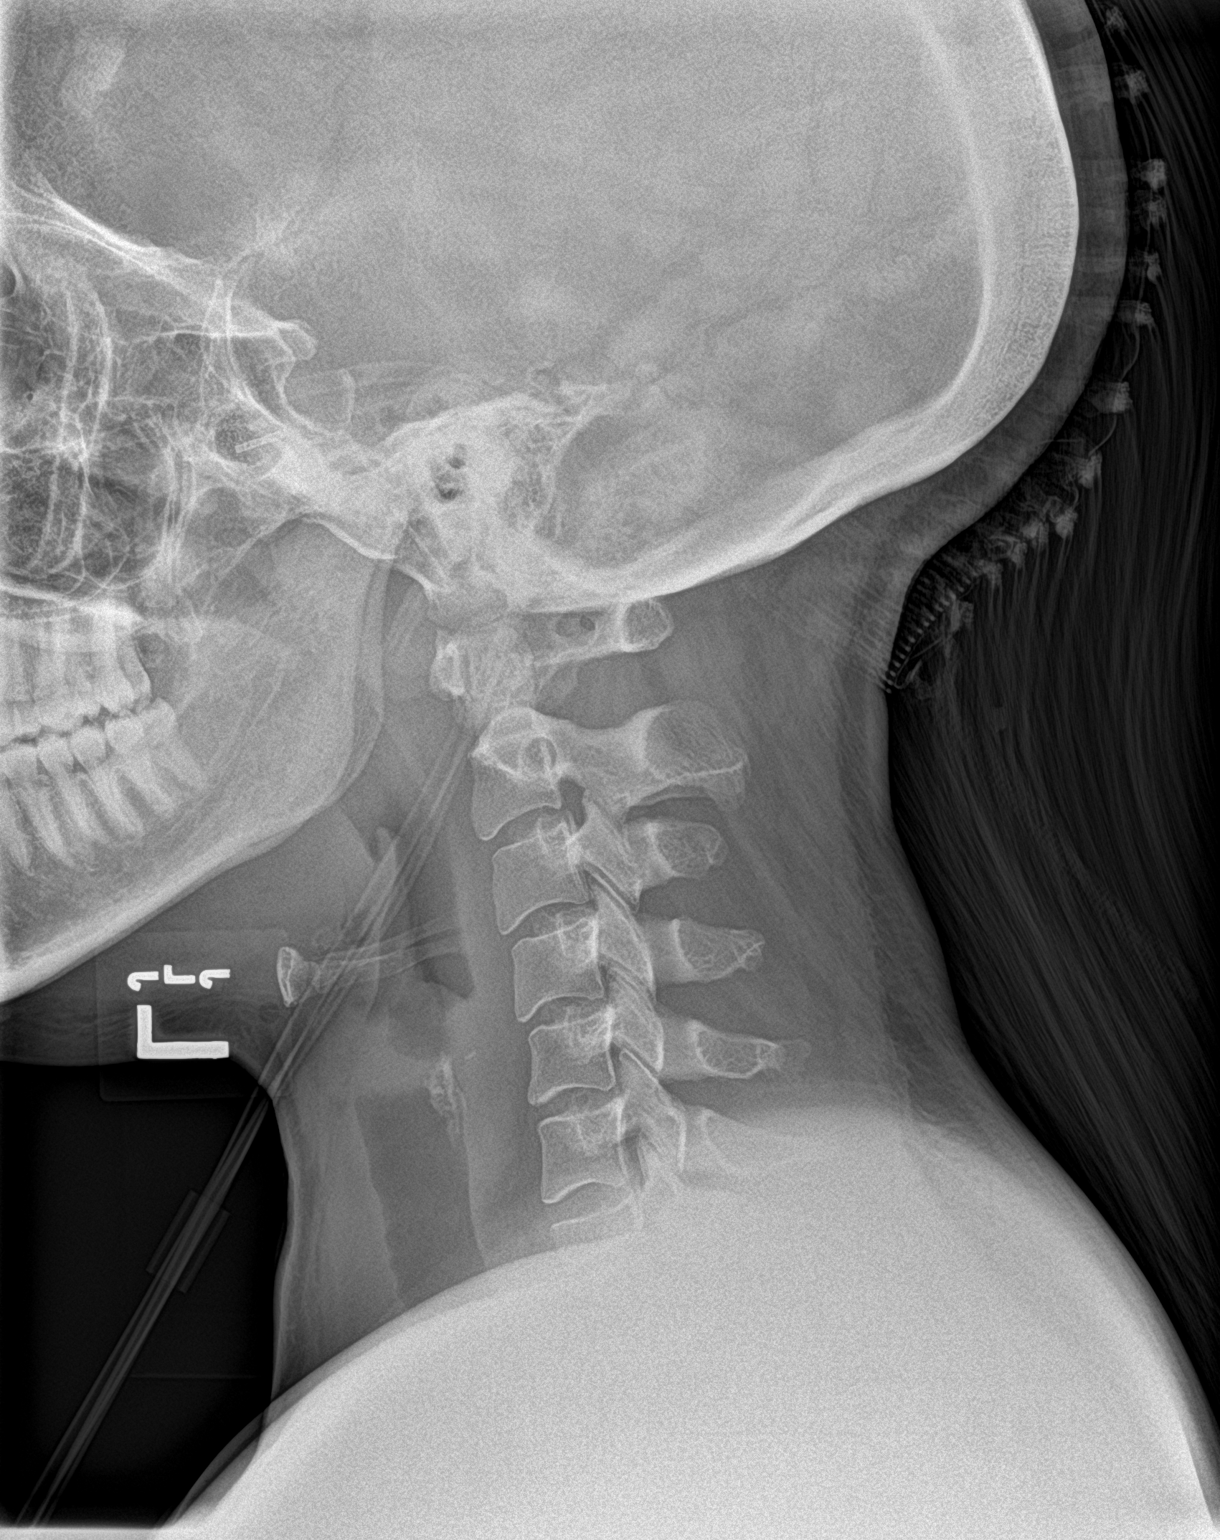

[neck ap]
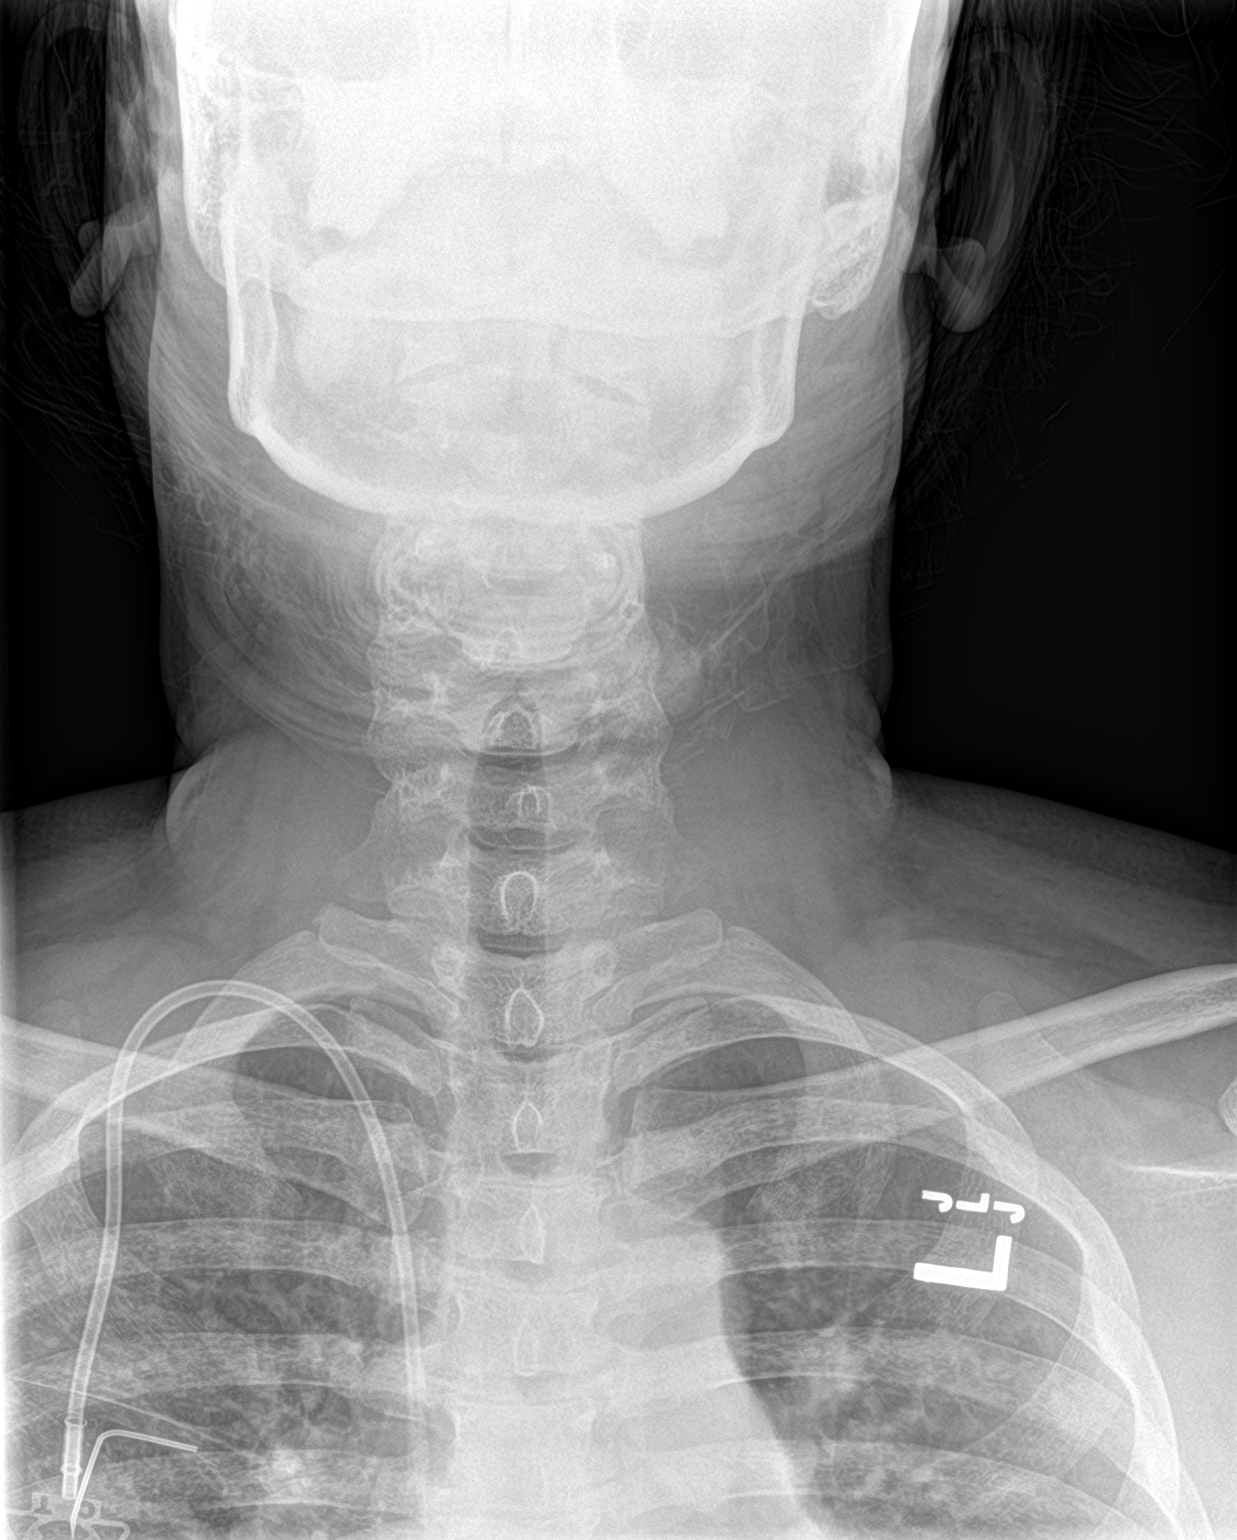

[3 of 3 positions shown; findings below may reference images not displayed]

FINDINGS: Loss of normal cervical lordosis. No acute bony abnormality. Soft
tissue structures are unremarkable. If symptoms remain
contrast-enhanced neck CT can be obtained . Prior port catheter
noted. Pulmonary apices are clear.
IMPRESSION: 1. Loss of normal cervical lordosis. No acute or focal bony
abnormality.

2. Soft tissue structures are unremarkable.

## 2017-05-19 ENCOUNTER — Other Ambulatory Visit: Payer: Self-pay | Admitting: *Deleted

## 2017-05-19 NOTE — Patient Outreach (Signed)
[facility]) Care Management  05/19/2017  [name] Feb 19, 1991 [id number]  Telephone Screen  Referral Date:  05/03/2017 Referral Source:  [facility] ED Census Reason for Referral:  6 or more ED visits in the last 6 months Insurance:  Medicare/Medicaid   Outreach Attempt:  Outreach attempt #1 to patient for telephone screening. No answer. RN Health Coach unable to leave voice message due to patient voicemail box not set up.  Plan:  RN Health Coach will make another telephone outreach for screening within the next 5 business days.   [place] [phone] [email or web address]

## 2017-05-22 ENCOUNTER — Other Ambulatory Visit: Payer: Self-pay | Admitting: *Deleted

## 2017-05-22 NOTE — Patient Outreach (Signed)
[facility]) Care Management  05/22/2017  [name] 12/05/1990 [id number]  Telephone Screen  Referral Date:  05/03/2017 Referral Source:  [facility] ED Census Reason for Referral:  6 or more ED visits in the last 6 months Insurance:  Medicare/Medicaid  Outreach Attempt:   Outreach attempt #2 to patient for telephone screening. Patient's listed first home number [phone] is not in service.  Listed cell number [phone] is not in service.  The second listed home number [phone] is answered by a lady stating "wrong number".  RN Health Coach unable to contact patient or leave voice message.  Plan:  RN Health Coach will send unsuccessful outreach letter to patient and close case if no response within 10 business days.   [place] [phone] [email or web address]

## 2017-05-26 ENCOUNTER — Ambulatory Visit: Payer: Self-pay | Admitting: Family Medicine

## 2017-05-27 ENCOUNTER — Other Ambulatory Visit: Payer: Self-pay

## 2017-05-27 ENCOUNTER — Emergency Department (HOSPITAL_COMMUNITY): Payer: Medicare Other

## 2017-05-27 ENCOUNTER — Encounter (HOSPITAL_COMMUNITY): Payer: Self-pay

## 2017-05-27 ENCOUNTER — Inpatient Hospital Stay (HOSPITAL_COMMUNITY)
Admission: EM | Admit: 2017-05-27 | Discharge: 2017-06-01 | DRG: 812 | Disposition: A | Discharge status: Home / Self Care | Payer: Medicare Other | Attending: Internal Medicine | Admitting: Internal Medicine

## 2017-05-27 DIAGNOSIS — Z9049 Acquired absence of other specified parts of digestive tract: Secondary | ICD-10-CM

## 2017-05-27 DIAGNOSIS — Z91018 Allergy to other foods: Secondary | ICD-10-CM

## 2017-05-27 DIAGNOSIS — M549 Dorsalgia, unspecified: Secondary | ICD-10-CM | POA: Diagnosis present

## 2017-05-27 DIAGNOSIS — Z7901 Long term (current) use of anticoagulants: Secondary | ICD-10-CM

## 2017-05-27 DIAGNOSIS — G8929 Other chronic pain: Secondary | ICD-10-CM | POA: Diagnosis present

## 2017-05-27 DIAGNOSIS — Z832 Family history of diseases of the blood and blood-forming organs and certain disorders involving the immune mechanism: Secondary | ICD-10-CM

## 2017-05-27 DIAGNOSIS — G894 Chronic pain syndrome: Secondary | ICD-10-CM | POA: Diagnosis not present

## 2017-05-27 DIAGNOSIS — Z8249 Family history of ischemic heart disease and other diseases of the circulatory system: Secondary | ICD-10-CM | POA: Diagnosis not present

## 2017-05-27 DIAGNOSIS — G43909 Migraine, unspecified, not intractable, without status migrainosus: Secondary | ICD-10-CM | POA: Diagnosis present

## 2017-05-27 DIAGNOSIS — R509 Fever, unspecified: Secondary | ICD-10-CM

## 2017-05-27 DIAGNOSIS — K219 Gastro-esophageal reflux disease without esophagitis: Secondary | ICD-10-CM | POA: Diagnosis present

## 2017-05-27 DIAGNOSIS — Z91040 Latex allergy status: Secondary | ICD-10-CM

## 2017-05-27 DIAGNOSIS — F633 Trichotillomania: Secondary | ICD-10-CM | POA: Diagnosis present

## 2017-05-27 DIAGNOSIS — Z833 Family history of diabetes mellitus: Secondary | ICD-10-CM

## 2017-05-27 DIAGNOSIS — Z659 Problem related to unspecified psychosocial circumstances: Secondary | ICD-10-CM | POA: Diagnosis not present

## 2017-05-27 DIAGNOSIS — D473 Essential (hemorrhagic) thrombocythemia: Secondary | ICD-10-CM | POA: Diagnosis not present

## 2017-05-27 DIAGNOSIS — Z86711 Personal history of pulmonary embolism: Secondary | ICD-10-CM | POA: Diagnosis not present

## 2017-05-27 DIAGNOSIS — D638 Anemia in other chronic diseases classified elsewhere: Secondary | ICD-10-CM | POA: Diagnosis not present

## 2017-05-27 DIAGNOSIS — D5700 Hb-SS disease with crisis, unspecified: Principal | ICD-10-CM | POA: Diagnosis present

## 2017-05-27 DIAGNOSIS — Z87891 Personal history of nicotine dependence: Secondary | ICD-10-CM

## 2017-05-27 DIAGNOSIS — Z86718 Personal history of other venous thrombosis and embolism: Secondary | ICD-10-CM | POA: Diagnosis not present

## 2017-05-27 DIAGNOSIS — D72829 Elevated white blood cell count, unspecified: Secondary | ICD-10-CM | POA: Diagnosis not present

## 2017-05-27 DIAGNOSIS — R109 Unspecified abdominal pain: Secondary | ICD-10-CM | POA: Diagnosis not present

## 2017-05-27 DIAGNOSIS — R079 Chest pain, unspecified: Secondary | ICD-10-CM | POA: Diagnosis not present

## 2017-05-27 LAB — COMPREHENSIVE METABOLIC PANEL
ALT: 14 U/L (ref 14–54)
ANION GAP: 7 (ref 5–15)
AST: 20 U/L (ref 15–41)
Albumin: 4.1 g/dL (ref 3.5–5.0)
Alkaline Phosphatase: 59 U/L (ref 38–126)
BILIRUBIN TOTAL: 1.8 mg/dL — AB (ref 0.3–1.2)
BUN: 7 mg/dL (ref 6–20)
CO2: 25 mmol/L (ref 22–32)
Calcium: 8.9 mg/dL (ref 8.9–10.3)
Chloride: 106 mmol/L (ref 101–111)
Creatinine, Ser: 0.79 mg/dL (ref 0.44–1.00)
Glucose, Bld: 96 mg/dL (ref 65–99)
POTASSIUM: 3.6 mmol/L (ref 3.5–5.1)
Sodium: 138 mmol/L (ref 135–145)
TOTAL PROTEIN: 7.2 g/dL (ref 6.5–8.1)

## 2017-05-27 LAB — CBC WITH DIFFERENTIAL/PLATELET
BASOS ABS: 0.2 10*3/uL — AB (ref 0.0–0.1)
Basophils Relative: 1 %
Eosinophils Absolute: 0.6 10*3/uL (ref 0.0–0.7)
Eosinophils Relative: 4 %
HEMATOCRIT: 25 % — AB (ref 36.0–46.0)
Hemoglobin: 8.6 g/dL — ABNORMAL LOW (ref 12.0–15.0)
LYMPHS ABS: 4.5 10*3/uL — AB (ref 0.7–4.0)
Lymphocytes Relative: 29 %
MCH: 29 pg (ref 26.0–34.0)
MCHC: 34.4 g/dL (ref 30.0–36.0)
MCV: 84.2 fL (ref 78.0–100.0)
MONOS PCT: 10 %
Monocytes Absolute: 1.5 10*3/uL — ABNORMAL HIGH (ref 0.1–1.0)
Neutro Abs: 8.6 10*3/uL — ABNORMAL HIGH (ref 1.7–7.7)
Neutrophils Relative %: 56 %
PLATELETS: 635 10*3/uL — AB (ref 150–400)
RBC: 2.97 MIL/uL — AB (ref 3.87–5.11)
RDW: 19.3 % — AB (ref 11.5–15.5)
WBC: 15.4 10*3/uL — AB (ref 4.0–10.5)

## 2017-05-27 LAB — RETICULOCYTES
RBC.: 2.97 MIL/uL — ABNORMAL LOW (ref 3.87–5.11)
RETIC COUNT ABSOLUTE: 409.9 10*3/uL — AB (ref 19.0–186.0)
Retic Ct Pct: 13.8 % — ABNORMAL HIGH (ref 0.4–3.1)

## 2017-05-27 LAB — I-STAT BETA HCG BLOOD, ED (MC, WL, AP ONLY): I-stat hCG, quantitative: <5 m[IU]/mL (ref <5)

## 2017-05-27 LAB — I-STAT TROPONIN, ED
TROPONIN I, POC: 0.01 ng/mL (ref 0.00–0.08)
Troponin i, poc: 0.01 ng/mL (ref 0.00–0.08)

## 2017-05-27 MED ORDER — HYDROMORPHONE HCL 2 MG/ML IJ SOLN: 2.0000 mg | INTRAMUSCULAR | Status: AC

## 2017-05-27 MED ORDER — HYDROMORPHONE HCL 2 MG/ML IJ SOLN
2.0000 mg | INTRAMUSCULAR | Status: AC
  Administered 2017-05-27: 2 mg via INTRAVENOUS
  Filled 2017-05-27: qty 1

## 2017-05-27 MED ORDER — DIPHENHYDRAMINE HCL 25 MG PO CAPS
25.0000 mg | ORAL_CAPSULE | ORAL | Status: DC | PRN
  Administered 2017-05-29: 25 mg via ORAL
  Filled 2017-05-27: qty 1

## 2017-05-27 MED ORDER — SENNOSIDES-DOCUSATE SODIUM 8.6-50 MG PO TABS
1.0000 | ORAL_TABLET | Freq: Two times a day (BID) | ORAL | Status: DC
  Administered 2017-05-27 – 2017-06-01 (×10): 1 via ORAL
  Filled 2017-05-27 (×10): qty 1

## 2017-05-27 MED ORDER — KETOROLAC TROMETHAMINE 30 MG/ML IJ SOLN
30.0000 mg | Freq: Four times a day (QID) | INTRAMUSCULAR | Status: AC
  Administered 2017-05-27 – 2017-06-01 (×19): 30 mg via INTRAVENOUS
  Filled 2017-05-27 (×19): qty 1

## 2017-05-27 MED ORDER — DIPHENHYDRAMINE HCL 50 MG/ML IJ SOLN
25.0000 mg | Freq: Once | INTRAMUSCULAR | Status: AC
  Administered 2017-05-27: 25 mg via INTRAVENOUS
  Filled 2017-05-27: qty 1

## 2017-05-27 MED ORDER — SODIUM CHLORIDE 0.9 % IV SOLN
25.0000 mg | INTRAVENOUS | Status: DC | PRN
  Filled 2017-05-27: qty 0.5

## 2017-05-27 MED ORDER — HYDROMORPHONE HCL 1 MG/ML IJ SOLN
1.0000 mg | INTRAMUSCULAR | Status: AC
  Administered 2017-05-27: 1 mg via INTRAVENOUS
  Filled 2017-05-27: qty 1

## 2017-05-27 MED ORDER — POLYETHYLENE GLYCOL 3350 17 G PO PACK
17.0000 g | PACK | Freq: Every day | ORAL | Status: DC | PRN
  Administered 2017-05-31: 17 g via ORAL
  Filled 2017-05-27: qty 1

## 2017-05-27 MED ORDER — HYDROMORPHONE HCL 1 MG/ML IJ SOLN
0.5000 mg | Freq: Once | INTRAMUSCULAR | Status: DC
  Filled 2017-05-27: qty 1

## 2017-05-27 MED ORDER — DEXTROSE-NACL 5-0.45 % IV SOLN
INTRAVENOUS | Status: DC
  Administered 2017-05-27 – 2017-05-29 (×4): via INTRAVENOUS

## 2017-05-27 MED ORDER — RIVAROXABAN 20 MG PO TABS
20.0000 mg | ORAL_TABLET | Freq: Every day | ORAL | Status: DC
  Administered 2017-05-27 – 2017-05-31 (×5): 20 mg via ORAL
  Filled 2017-05-27 (×5): qty 1

## 2017-05-27 MED ORDER — ADULT MULTIVITAMIN W/MINERALS CH
1.0000 | ORAL_TABLET | Freq: Every day | ORAL | Status: DC
  Administered 2017-05-27 – 2017-06-01 (×6): 1 via ORAL
  Filled 2017-05-27 (×6): qty 1

## 2017-05-27 MED ORDER — ONDANSETRON HCL 4 MG/2ML IJ SOLN
4.0000 mg | INTRAMUSCULAR | Status: DC | PRN
  Administered 2017-05-27: 4 mg via INTRAVENOUS
  Filled 2017-05-27: qty 2

## 2017-05-27 MED ORDER — MORPHINE SULFATE ER 30 MG PO TBCR
60.0000 mg | EXTENDED_RELEASE_TABLET | Freq: Two times a day (BID) | ORAL | Status: DC
  Administered 2017-05-27 – 2017-06-01 (×10): 60 mg via ORAL
  Filled 2017-05-27 (×10): qty 2

## 2017-05-27 MED ORDER — DIPHENHYDRAMINE HCL 50 MG/ML IJ SOLN
12.5000 mg | Freq: Once | INTRAMUSCULAR | Status: AC
  Administered 2017-05-27: 12.5 mg via INTRAVENOUS
  Filled 2017-05-27: qty 1

## 2017-05-27 MED ORDER — SODIUM CHLORIDE 0.9% FLUSH: 9.0000 mL | INTRAVENOUS | Status: DC | PRN

## 2017-05-27 MED ORDER — KETOROLAC TROMETHAMINE 15 MG/ML IJ SOLN
15.0000 mg | INTRAMUSCULAR | Status: AC
  Administered 2017-05-27: 15 mg via INTRAVENOUS
  Filled 2017-05-27: qty 1

## 2017-05-27 MED ORDER — NALOXONE HCL 0.4 MG/ML IJ SOLN: 0.4000 mg | INTRAMUSCULAR | Status: DC | PRN

## 2017-05-27 MED ORDER — HYDROXYUREA 500 MG PO CAPS
500.0000 mg | ORAL_CAPSULE | Freq: Two times a day (BID) | ORAL | Status: DC
  Administered 2017-05-27 – 2017-05-30 (×6): 500 mg via ORAL
  Filled 2017-05-27 (×6): qty 1

## 2017-05-27 MED ORDER — HYDROMORPHONE 1 MG/ML IV SOLN
INTRAVENOUS | Status: DC
  Administered 2017-05-27: 23:00:00 via INTRAVENOUS
  Administered 2017-05-28: 11.4 mg via INTRAVENOUS
  Administered 2017-05-28: 5.4 mg via INTRAVENOUS
  Administered 2017-05-28: 4.2 mg via INTRAVENOUS
  Administered 2017-05-28: 11:00:00 via INTRAVENOUS
  Administered 2017-05-28: 9.6 mg via INTRAVENOUS
  Administered 2017-05-28: 8.4 mg via INTRAVENOUS
  Administered 2017-05-29: via INTRAVENOUS
  Administered 2017-05-29: 11.2 mg via INTRAVENOUS
  Administered 2017-05-29: 4.8 mg via INTRAVENOUS
  Administered 2017-05-29: 9.39 mg via INTRAVENOUS
  Administered 2017-05-29 (×2): 3.6 mg via INTRAVENOUS
  Administered 2017-05-29: 14:00:00 via INTRAVENOUS
  Administered 2017-05-29: 10.2 mg via INTRAVENOUS
  Administered 2017-05-29: 0.6 mg via INTRAVENOUS
  Administered 2017-05-30: 3.6 mg via INTRAVENOUS
  Administered 2017-05-30: 4.2 mg via INTRAVENOUS
  Administered 2017-05-30: 7.69 mg via INTRAVENOUS
  Administered 2017-05-30: 3 mg via INTRAVENOUS
  Administered 2017-05-30: 08:00:00 via INTRAVENOUS
  Administered 2017-05-30: 7.8 mg via INTRAVENOUS
  Administered 2017-05-31: 01:00:00 via INTRAVENOUS
  Administered 2017-05-31: 7.2 mg via INTRAVENOUS
  Administered 2017-05-31: 9.6 mg via INTRAVENOUS
  Administered 2017-05-31: 7.8 mg via INTRAVENOUS
  Administered 2017-05-31: 16:00:00 via INTRAVENOUS
  Administered 2017-05-31: 0 mg via INTRAVENOUS
  Administered 2017-05-31: 6.6 mg via INTRAVENOUS
  Administered 2017-06-01: 7.2 mg via INTRAVENOUS
  Administered 2017-06-01: 04:00:00 via INTRAVENOUS
  Administered 2017-06-01: 9 mg via INTRAVENOUS
  Administered 2017-06-01: 0 mg via INTRAVENOUS
  Administered 2017-06-01: 10.2 mg via INTRAVENOUS
  Filled 2017-05-27 (×8): qty 25

## 2017-05-27 NOTE — ED Triage Notes (Signed)
Patient BIB EMS from home. Patient complaining of sickle cell crisis with chest pain. EKG WNL for EMS. Patient reports her pain has been increasing over the past 3 days. Patient reports taking her prescribed pain medication without relief.

## 2017-05-27 NOTE — ED Notes (Signed)
ED TO INPATIENT HANDOFF REPORT  Name/Age/Gender [name] 27 y.o. female  Code Status    Code Status Orders  (From admission, onward)        Start     Ordered   05/27/17 2129  Full code  Continuous     05/27/17 2139    Code Status History    Date Active Date Inactive Code Status Order ID Comments User Context   05/04/2017 21:05 05/07/2017 21:27 Full Code [id number]  Rise Patience, MD ED   04/01/2017 18:13 04/03/2017 18:41 Full Code [id number]  Aline August, MD ED   02/28/2017 00:45 03/05/2017 21:33 Full Code [id number]  Etta Quill, DO ED   02/02/2017 03:09 02/04/2017 22:50 Full Code [id number]  Bethena Roys, MD Inpatient   12/10/2016 04:03 12/14/2016 17:05 Full Code [id number]  Etta Quill, DO ED   11/06/2016 20:39 11/11/2016 16:59 Full Code [id number]  Patrecia Pour, MD Inpatient   10/06/2016 16:47 10/09/2016 18:09 Full Code [id number]  [name], MD ED   10/02/2016 15:12 10/05/2016 10:42 Full Code [id number]  Tresa Garter, MD Inpatient   09/15/2016 17:23 09/19/2016 20:25 Full Code [id number]  Dorena Dew, [place] Inpatient   08/20/2016 18:12 08/30/2016 16:13 Full Code [id number]  [name], MD Inpatient   07/25/2016 23:20 07/29/2016 21:26 Full Code [id number]  Norval Morton, MD ED   06/26/2016 21:54 07/02/2016 19:54 Full Code [id number]  Etta Quill, DO ED   06/10/2016 05:11 06/15/2016 15:11 Full Code [id number]  Etta Quill, DO ED   05/29/2016 20:07 05/31/2016 18:03 Full Code [id number]  Cristal Ford, DO Inpatient   05/19/2016 19:14 05/25/2016 20:05 Full Code [id number]  Charlynne Cousins, MD Inpatient   04/24/2016 10:59 05/02/2016 17:50 Full Code [id number]  Elwyn Reach, MD Inpatient   04/17/2016 21:05 04/20/2016 19:13 Full Code [id number]  Reubin Milan, MD Inpatient   04/17/2016 21:05 04/17/2016 21:05 Full Code [id number]  Reubin Milan, MD Inpatient   03/31/2016 16:38 04/07/2016 18:08 Full Code [id number]  [name], MD Inpatient   02/26/2016 18:59 03/03/2016 21:04 Full Code [id number]  [name], MD Inpatient   02/16/2016 10:33 02/17/2016 11:01 Full Code [id number]  Tresa Garter, MD Inpatient   01/30/2016 07:51 02/05/2016 19:04 Full Code [id number]  Ivor Costa, MD ED   01/05/2016 08:37 01/09/2016 17:27 Full Code [id number]  Tresa Garter, MD Inpatient   12/27/2015 03:58 12/29/2015 20:09 Full Code [id number]  Ivor Costa, MD ED   11/09/2015 01:18 11/12/2015 18:37 Full Code [id number]  Norval Morton, MD ED   10/31/2015 18:54 11/02/2015 14:56 Full Code [id number]  Manya Silvas, [place] Inpatient   10/30/2015 14:46 10/31/2015 18:54 Full Code [id number]  Lavonia Drafts, MD Inpatient   10/26/2015 21:25 10/30/2015 14:46 Full Code [id number]  Gwynne Edinger, MD Inpatient   10/15/2015 18:57 10/18/2015 20:27 Full Code [id number]  [name], MD Inpatient   10/06/2015 22:58 10/10/2015 16:23 Full Code [id number]  Lily Kocher, MD ED   09/22/2015 20:26 09/27/2015 17:35 Full Code [id number]  Osborne Oman, MD Inpatient   09/07/2015 14:31 09/14/2015 14:54 Full Code [id number]  [name], MD Inpatient   08/10/2015 14:05 08/17/2015 16:38 Full Code [id number]  [name], MD Inpatient   07/24/2015 05:31 08/02/2015 18:17 Full Code [id number]  Carlyle Dolly, MD Inpatient   07/24/2015 04:47 07/24/2015 05:31 Full Code [id number]  Carlyle Dolly, MD Inpatient   07/24/2015 03:53 07/24/2015 04:47 Full  Code [id number]  Carlyle Dolly, MD Inpatient   07/12/2015 02:14 07/14/2015 20:28 Full Code [id number]  Ivor Costa, MD ED   06/28/2015 10:37 07/03/2015 14:41 Full Code [id number]  [name], MD Inpatient   05/31/2015 14:59 06/05/2015 20:49 Full Code [id number]  [name], MD ED   05/02/2015 16:39 05/08/2015 16:44 Full Code [id number]  [name], MD Inpatient   04/14/2015 17:41 04/22/2015 20:06 Full Code [id number]  Tresa Garter, MD Inpatient    04/14/2015 17:41 04/14/2015 17:41 Full Code [id number]  Tresa Garter, MD Inpatient   04/14/2015 11:53 04/14/2015 17:41 Full Code [id number]  Tresa Garter, MD Inpatient   03/30/2015 11:04 04/03/2015 17:42 Full Code [id number]  Tresa Garter, MD Inpatient   03/22/2015 20:42 03/26/2015 17:47 Full Code [id number]  Reubin Milan, MD ED   01/25/2015 13:23 01/27/2015 15:54 Full Code [id number]  Elwyn Reach, MD Inpatient   12/07/2014 15:57 12/10/2014 17:20 Full Code [id number]  Keitha Butte, CNM Inpatient   12/05/2014 09:20 12/07/2014 15:57 Full Code [id number]  Tami Ribas, RN Inpatient   11/26/2014 22:16 12/01/2014 20:33 Full Code [id number]  Virginia Crews, MD Inpatient   11/19/2014 01:29 11/21/2014 18:12 Full Code [id number]  Seabron Spates, [place] Inpatient   10/23/2014 21:18 10/30/2014 19:32 Full Code [id number]  Truett Mainland, DO Inpatient   10/11/2014 00:10 10/14/2014 17:07 Full Code [id number]  Larey Days, [place] Inpatient   09/18/2014 15:45 09/26/2014 16:56 Full Code [id number]  Lavonia Drafts, MD Inpatient   09/08/2014 01:17 09/10/2014 18:21 Full Code [id number]  Lavonia Drafts, MD Inpatient   09/08/2014 01:17 09/08/2014 01:17 Full Code [id number]  Lavonia Drafts, MD Inpatient   08/09/2014 21:37 08/15/2014 20:48 Full Code [id number]  Tresea Mall, CNM Inpatient   07/14/2014 03:01 07/16/2014 16:53 Full Code [id number]  Brantley Persons, RN Inpatient   06/25/2014 16:51 06/28/2014 17:05 Full Code [id number]  [name], MD Inpatient   06/02/2014 01:06 06/05/2014 19:36 Full Code [id number]  Elberta Leatherwood, MD Inpatient   05/03/2014 18:18 05/06/2014 21:58 Full Code [id number]  Markus Daft, MD Inpatient   05/02/2014 12:07 05/03/2014 18:18 Full Code [id number]  [name], MD ED   04/10/2014 16:41 04/18/2014 21:17 Full Code [id number]  Greggory Keen, MD Inpatient   04/09/2014 10:28 04/10/2014 16:41 Full Code [id number]  Theodis Blaze, MD  Inpatient   02/16/2014 20:20 02/25/2014 14:11 Full Code [id number]  Caren Griffins, MD ED   01/29/2014 03:43 02/01/2014 14:42 Full Code [id number]  Rise Patience, MD Inpatient   01/13/2014 18:15 01/16/2014 13:04 Full Code [id number]  Robbie Lis, MD Inpatient   11/25/2013 14:37 11/30/2013 20:09 Full Code [id number]  Verlee Monte, MD Inpatient   09/24/2013 06:15 09/27/2013 15:32 Full Code [id number]  Rise Patience, MD Inpatient   07/31/2013 03:12 08/04/2013 20:47 Full Code [id number]  Etta Quill, DO ED   07/16/2013 09:07 07/16/2013 23:10 Full Code [id number]  [name], MD Inpatient   05/28/2013 05:15 06/01/2013 21:17 Full Code [id number]  Berle Mull, MD ED   03/23/2013 04:30 03/29/2013 19:37 Full Code [id number]  Theressa Millard, MD ED   02/11/2013 08:03 02/14/2013 16:39 Full Code [id number]  Rise Patience, MD Inpatient   01/27/2013 12:57 01/30/2013 18:53 Full Code [id number]  Domenic Polite, MD Inpatient   10/05/2012 21:54 10/12/2012 21:07 Full Code [id number]  Theressa Millard, MD ED   08/27/2012 19:55 08/31/2012 18:33 Full Code [id number]  Thurnell Lose, MD  Inpatient   06/22/2012 14:36 06/27/2012 14:57 Full Code [id number]  Jorene Guest, RN Inpatient   03/28/2012 21:22 04/04/2012 19:29 Full Code [id number]  Verl Dicker, PA-C ED   03/24/2012 04:32 03/25/2012 11:44 Full Code [id number]  Martie Lee, [place] ED   03/04/2012 04:48 03/15/2012 22:58 Full Code [id number]  Kennith Center, RN Inpatient   02/13/2012 06:47 02/17/2012 01:24 Full Code [id number]  Jacqualyn Posey, RN Inpatient   12/17/2011 16:14 12/22/2011 17:16 Full Code [id number]  Theodis Blaze, MD Inpatient   12/16/2011 22:05 12/17/2011 16:14 Full Code [id number]  Threasa Beards, MD ED   11/30/2011 03:09 12/03/2011 17:45 Full Code [id number]  Evelina Dun, RN ED   11/08/2011 07:23 11/14/2011 18:23 Full Code [id number]  Ron Parker, RN ED   09/05/2011 02:04 09/05/2011 07:32 Full Code [id number]  Garald Balding, NP ED   08/05/2011  07:55 08/05/2011 18:52 Full Code [id number]  Rainey Pines, RN ED   08/05/2011 00:28 08/05/2011 07:55 Full Code [id number]  Charlena Cross, MD ED   07/13/2011 11:49 07/15/2011 18:47 Full Code [id number]  Julieta Bellini, NP ED   06/09/2011 00:48 06/09/2011 09:59 Full Code [id number]  Charlena Cross, MD ED   05/31/2011 18:28 06/01/2011 18:37 Full Code [id number]  Jenel Lucks, RN Inpatient   05/26/2011 02:18 05/26/2011 14:19 Full Code [id number]  Wynetta Fines, MD ED   05/06/2011 07:00 05/08/2011 16:09 Full Code [id number]  Piloto de Tressia Miners, MD Inpatient   04/17/2011 13:29 04/19/2011 16:50 Full Code [id number]  Judithann Sheen, MD ED   03/30/2011 00:39 04/03/2011 00:25 Full Code [id number]  Hoy Morn, MD ED      Home/SNF/Other Home  Chief Complaint sickle cell pain  Level of Care/Admitting Diagnosis ED Disposition    ED Disposition Condition [facility] Area: [facility] [[id number]]  Level of Care: Med-Surg [16]  Diagnosis: Sickle cell pain crisis [facility]) [[id number]]  Admitting Physician: Doreatha Massed  Attending Physician: Etta Quill [phone]  Estimated length of stay: past midnight tomorrow  Certification:: I certify this patient will need inpatient services for at least 2 midnights  PT Class (Do Not Modify): Inpatient [101]  PT Acc Code (Do Not Modify): Private [1]       Medical History Past Medical History:  Diagnosis Date  . Blood transfusion    "lots"  . Blood transfusion without reported diagnosis   . Chronic back pain    "very severe; have knot in my back; from tight muscle; take RX and exercise for it"  . Depression 01/06/2011  . Exertional dyspnea    "sometimes"  . Genital HSV   . GERD (gastroesophageal reflux disease) 02/17/2011  . Migraines 11/08/11   "@ least twice/month"  . 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  . Mood swings 11/08/11   "I go back and forth; real bad"  . Sickle cell anemia  ([name])   . Sickle cell anemia with crisis ([place])   . Thrombocytosis ([place]) 11/09/2015  . Trichotillomania    h/o    Allergies Allergies  Allergen Reactions  . Food Hives and Other (See Comments)    Pt is allergic to carrots.    . Latex Rash    IV Location/Drains/Wounds Patient Lines/Drains/Airways Status   Active Line/Drains/Airways    Name:   Placement date:   Placement time:   Site:   Days:   Implanted Port Right Chest   -    -  Chest             Labs/Imaging Results for orders placed or performed during the hospital encounter of 05/27/17 (from the past 48 hour(s))  Comprehensive metabolic panel     Status: Abnormal   Collection Time: 05/27/17  4:34 PM  Result Value Ref Range   Sodium 138 135 - 145 mmol/L   Potassium 3.6 3.5 - 5.1 mmol/L   Chloride 106 101 - 111 mmol/L   CO2 25 22 - 32 mmol/L   Glucose, Bld 96 65 - 99 mg/dL   BUN 7 6 - 20 mg/dL   Creatinine, Ser 0.79 0.44 - 1.00 mg/dL   Calcium 8.9 8.9 - 10.3 mg/dL   Total Protein 7.2 6.5 - 8.1 g/dL   Albumin 4.1 3.5 - 5.0 g/dL   AST 20 15 - 41 U/L   ALT 14 14 - 54 U/L   Alkaline Phosphatase 59 38 - 126 U/L   Total Bilirubin 1.8 (H) 0.3 - 1.2 mg/dL   GFR calc non Af Amer >60 >60 mL/min   GFR calc Af Amer >60 >60 mL/min    Comment: (NOTE) The eGFR has been calculated using the CKD EPI equation. This calculation has not been validated in all clinical situations. eGFR's persistently <60 mL/min signify possible Chronic Kidney Disease.    Anion gap 7 5 - 15  CBC with Differential     Status: Abnormal   Collection Time: 05/27/17  4:34 PM  Result Value Ref Range   WBC 15.4 (H) 4.0 - 10.5 K/uL    Comment: REPEATED TO VERIFY   RBC 2.97 (L) 3.87 - 5.11 MIL/uL   Hemoglobin 8.6 (L) 12.0 - 15.0 g/dL   HCT 25.0 (L) 36.0 - 46.0 %   MCV 84.2 78.0 - 100.0 fL   MCH 29.0 26.0 - 34.0 pg   MCHC 34.4 30.0 - 36.0 g/dL   RDW 19.3 (H) 11.5 - 15.5 %   Platelets 635 (H) 150 - 400 K/uL   Neutrophils Relative % 56 %    Lymphocytes Relative 29 %   Monocytes Relative 10 %   Eosinophils Relative 4 %   Basophils Relative 1 %   Neutro Abs 8.6 (H) 1.7 - 7.7 K/uL   Lymphs Abs 4.5 (H) 0.7 - 4.0 K/uL   Monocytes Absolute 1.5 (H) 0.1 - 1.0 K/uL   Eosinophils Absolute 0.6 0.0 - 0.7 K/uL   Basophils Absolute 0.2 (H) 0.0 - 0.1 K/uL   RBC Morphology POLYCHROMASIA PRESENT     Comment: TARGET CELLS HOWELL/JOLLY BODIES Sickle cells present   Reticulocytes     Status: Abnormal   Collection Time: 05/27/17  4:34 PM  Result Value Ref Range   Retic Ct Pct 13.8 (H) 0.4 - 3.1 %   RBC. 2.97 (L) 3.87 - 5.11 MIL/uL   Retic Count, Absolute 409.9 (H) 19.0 - 186.0 K/uL  I-Stat Troponin, ED (not at [facility])     Status: None   Collection Time: 05/27/17  4:46 PM  Result Value Ref Range   Troponin i, poc 0.01 0.00 - 0.08 ng/mL   Comment 3            Comment: Due to the release kinetics of cTnI, a negative result within the first hours of the onset of symptoms does not rule out myocardial infarction with certainty. If myocardial infarction is still suspected, repeat the test at appropriate intervals.   I-Stat beta hCG blood, ED     Status: None  Collection Time: 05/27/17  4:50 PM  Result Value Ref Range   I-stat hCG, quantitative <5.0 <5 mIU/mL   Comment 3            Comment:   GEST. AGE      CONC.  (mIU/mL)   <=1 WEEK        5 - 50     2 WEEKS       50 - 500     3 WEEKS       100 - 10,000     4 WEEKS     1,000 - 30,000        FEMALE AND NON-PREGNANT FEMALE:     LESS THAN 5 mIU/mL   I-Stat Troponin, ED (not at [facility])     Status: None   Collection Time: 05/27/17  8:55 PM  Result Value Ref Range   Troponin i, poc 0.01 0.00 - 0.08 ng/mL   Comment 3            Comment: Due to the release kinetics of cTnI, a negative result within the first hours of the onset of symptoms does not rule out myocardial infarction with certainty. If myocardial infarction is still suspected, repeat the test at appropriate intervals.     *Note: Due to a large number of results and/or encounters for the requested time period, some results have not been displayed. A complete set of results can be found in Results Review.   Dg Chest 2 View  Result Date: 05/27/2017 CLINICAL DATA:  Chest pain EXAM: CHEST  2 VIEW COMPARISON:  02/27/2017 FINDINGS: Right Port-A-Cath in place. Heart is mildly enlarged. No confluent opacities, effusions or edema. IMPRESSION: Mild cardiomegaly.  No active disease. Electronically Signed   By: Rolm Baptise M.D.   On: 05/27/2017 15:00    Pending Labs Unresulted Labs (From admission, onward)   None      Vitals/Pain Today's Vitals   05/27/17 2030 05/27/17 2045 05/27/17 2045 05/27/17 2100  BP: 115/87  115/87 112/70  Pulse: 99  99 90  Resp:   14   Temp:      TempSrc:      SpO2: 99%  96% 91%  Weight:      PainSc:  7       Isolation Precautions No active isolations  Medications Medications  HYDROmorphone (DILAUDID) injection 0.5 mg (not administered)  dextrose 5 %-0.45 % sodium chloride infusion ( Intravenous New Bag/Given 05/27/17 1937)  ondansetron (ZOFRAN) injection 4 mg (4 mg Intravenous Given 05/27/17 1643)  hydroxyurea (HYDREA) capsule 500 mg (not administered)  morphine (MS CONTIN) 12 hr tablet 60 mg (not administered)  rivaroxaban (XARELTO) tablet 20 mg (not administered)  multivitamin with minerals tablet 1 tablet (not administered)  senna-docusate (Senokot-S) tablet 1 tablet (not administered)  polyethylene glycol (MIRALAX / GLYCOLAX) packet 17 g (not administered)  ketorolac (TORADOL) 30 MG/ML injection 30 mg (not administered)  naloxone (NARCAN) injection 0.4 mg (not administered)    And  sodium chloride flush (NS) 0.9 % injection 9 mL (not administered)  diphenhydrAMINE (BENADRYL) capsule 25 mg (not administered)    Or  diphenhydrAMINE (BENADRYL) 25 mg in sodium chloride 0.9 % 50 mL IVPB (not administered)  HYDROmorphone (DILAUDID) 1 mg/mL PCA injection (not administered)   HYDROmorphone (DILAUDID) injection 1 mg (not administered)  ketorolac (TORADOL) 15 MG/ML injection 15 mg (15 mg Intravenous Given 05/27/17 1646)  diphenhydrAMINE (BENADRYL) injection 25 mg (25 mg Intravenous Given 05/27/17 1644)  HYDROmorphone (DILAUDID) injection 2  mg (2 mg Intravenous Given 05/27/17 1648)    Or  HYDROmorphone (DILAUDID) injection 2 mg ( Subcutaneous See Alternative 05/27/17 1648)  HYDROmorphone (DILAUDID) injection 2 mg (2 mg Intravenous Given 05/27/17 1750)    Or  HYDROmorphone (DILAUDID) injection 2 mg ( Subcutaneous See Alternative 05/27/17 1750)  HYDROmorphone (DILAUDID) injection 2 mg (2 mg Intravenous Given 05/27/17 1943)    Or  HYDROmorphone (DILAUDID) injection 2 mg ( Subcutaneous See Alternative 05/27/17 1943)  diphenhydrAMINE (BENADRYL) injection 12.5 mg (12.5 mg Intravenous Given 05/27/17 1937)    Mobility Walks

## 2017-05-27 NOTE — ED Provider Notes (Signed)
[facility] ONCOLOG[facility] Provider Note   CSN: [id number] Arrival date & time: 05/27/17  1403     History   Chief Complaint Chief Complaint  Patient presents with  . Sickle Cell Pain Crisis    HPI [name] is a 27 y.o. female with PMH/o sickle cell anemia who presents for evaluation of 3 days of progressively worsening chest pain. Patient describes it as a sharp, intermittent pain to the left sided that wraps around her left rib cage.  Patient reports that she has been taking her at home oxycodone with minimal improvement in symptoms.  She reports that the pain is worsened with movement and with deep inspiration.  It is not worse with exertion.  She denies any associated nausea, vomiting, diaphoresis.  She denies any other alleviating or aggravating factors.  Patient reports that today the pain progressively worsened, prompting ED visit. She denies any OCP use, recent immobilization, prior history of DVT/PE, recent surgery, leg swelling, or long travel.  She denies any fevers, abdominal pain/vomiting.   The history is provided by the patient.    Past Medical History:  Diagnosis Date  . Blood transfusion    "lots"  . Blood transfusion without reported diagnosis   . Chronic back pain    "very severe; have knot in my back; from tight muscle; take RX and exercise for it"  . Depression 01/06/2011  . Exertional dyspnea    "sometimes"  . Genital HSV   . GERD (gastroesophageal reflux disease) 02/17/2011  . Migraines 11/08/11   "@ least twice/month"  . 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  . Mood swings 11/08/11   "I go back and forth; real bad"  . Sickle cell anemia ([name])   . Sickle cell anemia with crisis ([place])   . Thrombocytosis ([place]) 11/09/2015  . Trichotillomania    h/o    Patient Active Problem List   Diagnosis Date Noted  . Menorrhagia   . Sickle cell pain crisis ([place]) 05/04/2017  . Leukocytosis   . Chronic anticoagulation   .  Sickle cell anemia ([place]) 05/19/2016  . History of DVT (deep vein thrombosis) 04/17/2016  . Thrombocytosis ([place]) 11/09/2015  . Hyperbilirubinemia 11/09/2015  . Chronic pain 08/04/2015  . Herpes simplex 07/14/2015  . Anemia of chronic disease   . Sickle cell crisis ([place]) 08/09/2014  . 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 CHOLECYSTECTOMY  05/2010  . DILATION AND CURETTAGE OF UTERUS  02/20/11   S/P miscarriage  . IR GENERIC HISTORICAL  12/23/2015   IR FLUORO GUIDE CV LINE RIGHT 12/23/2015 Jacqulynn Cadet, MD [facility]  . IR GENERIC HISTORICAL  12/23/2015   IR US GUIDE VASC ACCESS RIGHT 12/23/2015 Jacqulynn Cadet, MD [facility]    OB History    Gravida Para Term Preterm AB Living   5 2 2  0 3 2   SAB TAB Ectopic Multiple Live Births   3 0 0 0 2      Obstetric Comments   Miscarried in October 2012 at about 7 weeks       Home Medications    Prior to Admission medications   Medication Sig Start Date End Date Taking? Authorizing Provider  aspirin-acetaminophen-caffeine (EXCEDRIN MIGRAINE) [phone] MG tablet Take 2 tablets every 6 (six) hours as needed by mouth for headache.   Yes [provider]  Cyanocobalamin-Caffeine (ENERGY CHEWS PO) Take 2 tablets daily by mouth.   Yes [provider]  hydroxyurea (HYDREA) 500 MG capsule Take 1 capsule (500 mg total) by mouth 2 (two) times daily. May take with food to minimize GI side effects. 04/27/17  Yes Scot Jun, FNP  medroxyPROGESTERone (DEPO-PROVERA) 150 MG/ML injection Inject 1 mL (150 mg total) into the muscle every 3 (three) months. 09/21/16  Yes Shelly Bombard, MD  morphine (MS CONTIN) 60 MG 12 hr tablet Take 60 mg by mouth every 12 (twelve) hours as needed for pain.    Yes [provider]  Multiple Vitamin (MULTIVITAMIN WITH MINERALS) TABS tablet Take 1 tablet  daily by mouth.   Yes [provider]  Oxycodone HCl 20 MG TABS Take 20 mg every 4 (four) hours as needed by mouth (pain).  01/17/17  Yes [provider]  rivaroxaban (XARELTO) 20 MG TABS tablet Take 1 tablet (20 mg total) by mouth daily with supper. 04/27/17  Yes Scot Jun, FNP    Family History Family History  Problem Relation Age of Onset  . Sickle cell trait Mother   . Sickle cell trait Father   . Diabetes Maternal Grandmother   . Diabetes Paternal Grandmother   . Hypertension Paternal Grandmother   . Diabetes Maternal Grandfather     Social History Social History   Tobacco Use  . Smoking status: Former Smoker    Packs/day: 0.25    Years: 1.00    Pack years: 0.25    Types: Cigarettes    Last attempt to quit: 03/25/2013    Years since quitting: 4.1  . Smokeless tobacco: Never Used  Substance Use Topics  . Alcohol use: No    Comment: denies  . Drug use: No    Comment: "Clean for 3 months"     Allergies   Food and Latex   Review of Systems Review of Systems  Constitutional: Negative for fever.  Respiratory: Negative for cough and shortness of breath.   Cardiovascular: Positive for chest pain. Negative for leg swelling.  Gastrointestinal: Negative for abdominal pain, nausea and vomiting.  Genitourinary: Negative for dysuria and hematuria.  Neurological: Negative for headaches.     Physical Exam Updated Vital Signs BP 109/82 (BP Location: Left Arm)   Pulse 84   Temp 98.6 F (37 C) (Oral)   Resp 13   Ht 5\' 1"  (1.549 m)   Wt 94.5 kg (208 lb 5.4 oz)   SpO2 100%   BMI 39.36 kg/m   Physical Exam  Constitutional: She is oriented to person, place, and time. She appears well-developed and well-nourished.  Appears uncomfortable but no acute distress   HENT:  Head: Normocephalic and atraumatic.  Mouth/Throat: Oropharynx is clear and moist and mucous membranes are normal.  Eyes: Conjunctivae, EOM and lids are normal. Pupils are equal,  round, and reactive to light.  Neck: Full passive range of motion without pain.  Cardiovascular: Normal rate, regular rhythm, normal heart sounds and normal pulses. Exam reveals no gallop and no friction rub.  No murmur heard. Pulses:      Radial pulses are 2+ on the right side, and 2+ on the left side.  Pulmonary/Chest: Effort normal and breath sounds normal.  Tenderness palpation the left side anterior chest and wraps around to the left lateral chest wall.  No deformity or crepitus noted. No evidence of respiratory distress. Able to speak in full sentences without difficulty.  Abdominal: Soft. Normal appearance. There is  no tenderness. There is no rigidity and no guarding.  Musculoskeletal: Normal range of motion.  BLE are symmetric in appearance  Neurological: She is alert and oriented to person, place, and time.  Skin: Skin is warm and dry. Capillary refill takes less than 2 seconds.  Psychiatric: She has a normal mood and affect. Her speech is normal.  Nursing note and vitals reviewed.    ED Treatments / Results  Labs (all labs ordered are listed, but only abnormal results are displayed) Labs Reviewed  COMPREHENSIVE METABOLIC PANEL - Abnormal; Notable for the following components:      Result Value   Total Bilirubin 1.8 (*)    All other components within normal limits  CBC WITH DIFFERENTIAL/PLATELET - Abnormal; Notable for the following components:   WBC 15.4 (*)    RBC 2.97 (*)    Hemoglobin 8.6 (*)    HCT 25.0 (*)    RDW 19.3 (*)    Platelets 635 (*)    Neutro Abs 8.6 (*)    Lymphs Abs 4.5 (*)    Monocytes Absolute 1.5 (*)    Basophils Absolute 0.2 (*)    All other components within normal limits  RETICULOCYTES - Abnormal; Notable for the following components:   Retic Ct Pct 13.8 (*)    RBC. 2.97 (*)    Retic Count, Absolute 409.9 (*)    All other components within normal limits  I-STAT BETA HCG BLOOD, ED ([facility], [facility], [facility] ONLY)  I-STAT TROPONIN, ED  I-STAT TROPONIN, ED      EKG  EKG Interpretation  Date/Time:  Saturday May 27 2017 14:22:39 EST Ventricular Rate:  85 PR Interval:    QRS Duration: 80 QT Interval:  312 QTC Calculation: 371 R Axis:   47 Text Interpretation:   Sinus rhythm Probable left atrial enlargement Borderline T wave abnormalities No significant change was found Confirmed by Jola Schmidt [phone]) on 05/27/2017 2:30:40 PM       Radiology Dg Chest 2 View  Result Date: 05/27/2017 CLINICAL DATA:  Chest pain EXAM: CHEST  2 VIEW COMPARISON:  02/27/2017 FINDINGS: Right Port-A-Cath in place. Heart is mildly enlarged. No confluent opacities, effusions or edema. IMPRESSION: Mild cardiomegaly.  No active disease. Electronically Signed   By: Rolm Baptise M.D.   On: 05/27/2017 15:00    Procedures Procedures (including critical care time)  Medications Ordered in ED Medications  HYDROmorphone (DILAUDID) injection 0.5 mg (0.5 mg Subcutaneous Not Given 05/27/17 1430)  dextrose 5 %-0.45 % sodium chloride infusion ( Intravenous New Bag/Given 05/27/17 1937)  ondansetron (ZOFRAN) injection 4 mg (4 mg Intravenous Given 05/27/17 1643)  hydroxyurea (HYDREA) capsule 500 mg (500 mg Oral Given 05/27/17 2311)  morphine (MS CONTIN) 12 hr tablet 60 mg (60 mg Oral Given 05/27/17 2325)  rivaroxaban (XARELTO) tablet 20 mg (20 mg Oral Given 05/27/17 2326)  multivitamin with minerals tablet 1 tablet (1 tablet Oral Given 05/27/17 2325)  senna-docusate (Senokot-S) tablet 1 tablet (1 tablet Oral Given 05/27/17 2325)  polyethylene glycol (MIRALAX / GLYCOLAX) packet 17 g (not administered)  ketorolac (TORADOL) 30 MG/ML injection 30 mg (30 mg Intravenous Given 05/27/17 2331)  naloxone (NARCAN) injection 0.4 mg (not administered)    And  sodium chloride flush (NS) 0.9 % injection 9 mL (not administered)  diphenhydrAMINE (BENADRYL) capsule 25 mg (not administered)    Or  diphenhydrAMINE (BENADRYL) 25 mg in sodium chloride 0.9 % 50 mL IVPB (not administered)   HYDROmorphone (DILAUDID) 1 mg/mL PCA injection ( Intravenous Set-up /  Initial Syringe 05/27/17 2312)  ketorolac (TORADOL) 15 MG/ML injection 15 mg (15 mg Intravenous Given 05/27/17 1646)  diphenhydrAMINE (BENADRYL) injection 25 mg (25 mg Intravenous Given 05/27/17 1644)  HYDROmorphone (DILAUDID) injection 2 mg (2 mg Intravenous Given 05/27/17 1648)    Or  HYDROmorphone (DILAUDID) injection 2 mg ( Subcutaneous See Alternative 05/27/17 1648)  HYDROmorphone (DILAUDID) injection 2 mg (2 mg Intravenous Given 05/27/17 1750)    Or  HYDROmorphone (DILAUDID) injection 2 mg ( Subcutaneous See Alternative 05/27/17 1750)  HYDROmorphone (DILAUDID) injection 2 mg (2 mg Intravenous Given 05/27/17 1943)    Or  HYDROmorphone (DILAUDID) injection 2 mg ( Subcutaneous See Alternative 05/27/17 1943)  diphenhydrAMINE (BENADRYL) injection 12.5 mg (12.5 mg Intravenous Given 05/27/17 1937)  HYDROmorphone (DILAUDID) injection 1 mg (1 mg Intravenous Given 05/27/17 2225)     Initial Impression / Assessment and Plan / ED Course  I have reviewed the triage vital signs and the nursing notes.  Pertinent labs & imaging results that were available during my care of the patient were reviewed by me and considered in my medical decision making (see chart for details).     27 y.o. F with PMH/o Sickle Cell anemia who presents for evaluation of 3 days of chest pain.  Patient states that is worse with deep respiration but not worse with exertion.  Pain became more frequent today, prompting ED visit.  She has been taking OxyContin at home with no improvement in symptoms.  She denies any other drug use. Patient is afebrile, non-toxic appearing, sitting comfortably on examination table. Vital signs reviewed and stable.  Physical exam shows tenderness palpation of the left-sided chest wall.  Consider ACS etiology versus sickle cell crisis.  Also consider acute infectious etiology.  History/physical exam not concerning for PE.  Initial labs  ordered at triage. Analgesics provided in the department.  Labs reviewed.  Troponin negative.  Beta negative.  CMP unremarkable.  CBC shows stable hemoglobin and hematocrit.  Reticulocyte stable.  Discussed results with patient.  She still having pain after first round of analgesics.  She states that the pain is a 7/10.  Additional analgesics will be given.  Reevaluation after second analgesics.  Patient still having some pain.  She is requesting some more Benadryl.  Repeat exam after third round of analgesics.  Patient still having significant chest pain.  Vital signs are stable at this time.  Repeat troponin is negative.  Given concerns for sickle cell crisis with acute chest syndrome, will plan for admission.  Discussed with hospitalist.  Will plan to admit.  Final Clinical Impressions(s) / ED Diagnoses   Final diagnoses:  Sickle cell pain crisis [facility])    ED Discharge Orders    None       Desma Mcgregor 05/28/17 0102    Lajean Saver, MD 05/28/17 1635

## 2017-05-27 NOTE — H&P (Signed)
History and Physical    [name] HQI:[id number] DOB: 12/31/1990 DOA: 05/27/2017  PCP: Scot Jun, FNP  Patient coming from: Home  I have personally briefly reviewed patient's old medical records in [place]  Chief Complaint: Sickle cell pain crisis  HPI: [name] is a 27 y.o. female with medical history significant of HGB SS disease, chronic pain.  Patient presents to the ED with c/o chest pain, progressively worsening.  Onset 3 days ago.  Sharp and intermittent.  Feels she is having sickle cell pain crisis she says.  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  Pain progressively worsening despite home meds prompting ED visit.   ED Course: Pain persists despite multiple rounds of IV dilaudid.   Review of Systems: As per HPI otherwise 10 point review of systems negative.   Past Medical History:  Diagnosis Date  . Blood transfusion    "lots"  . Blood transfusion without reported diagnosis   . Chronic back pain    "very severe; have knot in my back; from tight muscle; take RX and exercise for it"  . Depression 01/06/2011  . Exertional dyspnea    "sometimes"  . Genital HSV   . GERD (gastroesophageal reflux disease) 02/17/2011  . Migraines 11/08/11   "@ least twice/month"  . 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  . Mood swings 11/08/11   "I go back and forth; real bad"  . Sickle cell anemia ([name])   . Sickle cell anemia with crisis ([place])   . Thrombocytosis ([place]) 11/09/2015  . Trichotillomania    h/o    Past Surgical History:  Procedure Laterality Date  . CHOLECYSTECTOMY  05/2010  . DILATION AND CURETTAGE OF UTERUS  02/20/11   S/P miscarriage  . IR GENERIC HISTORICAL  12/23/2015   IR FLUORO GUIDE CV LINE RIGHT 12/23/2015 Jacqulynn Cadet, MD [facility]  . IR GENERIC HISTORICAL  12/23/2015   IR US GUIDE VASC ACCESS RIGHT 12/23/2015 Jacqulynn Cadet, MD [facility]     reports that she quit smoking about 4 years ago. Her  smoking use included cigarettes. She has a 0.25 pack-year smoking history. she has never used smokeless tobacco. She reports that she does not drink alcohol or use drugs.  Allergies  Allergen Reactions  . Food Hives and Other (See Comments)    Pt is allergic to carrots.    . Latex Rash    Family History  Problem Relation Age of Onset  . Sickle cell trait Mother   . Sickle cell trait Father   . Diabetes Maternal Grandmother   . Diabetes Paternal Grandmother   . Hypertension Paternal Grandmother   . Diabetes Maternal Grandfather      Prior to Admission medications   Medication Sig Start Date End Date Taking? Authorizing Provider  aspirin-acetaminophen-caffeine (EXCEDRIN MIGRAINE) [phone] MG tablet Take 2 tablets every 6 (six) hours as needed by mouth for headache.   Yes [provider]  Cyanocobalamin-Caffeine (ENERGY CHEWS PO) Take 2 tablets daily by mouth.   Yes [provider]  hydroxyurea (HYDREA) 500 MG capsule Take 1 capsule (500 mg total) by mouth 2 (two) times daily. May take with food to minimize GI side effects. 04/27/17  Yes Scot Jun, FNP  medroxyPROGESTERone (DEPO-PROVERA) 150 MG/ML injection Inject 1 mL (150 mg total) into the muscle every 3 (three) months. 09/21/16  Yes Shelly Bombard, MD  morphine (MS CONTIN) 60 MG 12 hr tablet Take  60 mg by mouth every 12 (twelve) hours as needed for pain.    Yes [provider]  Multiple Vitamin (MULTIVITAMIN WITH MINERALS) TABS tablet Take 1 tablet daily by mouth.   Yes [provider]  Oxycodone HCl 20 MG TABS Take 20 mg every 4 (four) hours as needed by mouth (pain).  01/17/17  Yes [provider]  rivaroxaban (XARELTO) 20 MG TABS tablet Take 1 tablet (20 mg total) by mouth daily with supper. 04/27/17  Yes Scot Jun, FNP    Physical Exam: Vitals:   05/27/17 2000 05/27/17 2030 05/27/17 2045 05/27/17 2100  BP: (!) 105/49 115/87 115/87 112/70  Pulse: (!) 109 99 99 90    Resp:   14   Temp:      TempSrc:      SpO2: 100% 99% 96% 91%  Weight:        Constitutional: Uncomfortable Eyes: PERRL, lids and conjunctivae normal ENMT: Mucous membranes are moist. Posterior pharynx clear of any exudate or lesions.Normal dentition.  Neck: normal, supple, no masses, no thyromegaly Respiratory: clear to auscultation bilaterally, no wheezing, no crackles. Normal respiratory effort. No accessory muscle use.  Cardiovascular: Regular rate and rhythm, no murmurs / rubs / gallops. No extremity edema. 2+ pedal pulses. No carotid bruits.  Abdomen: no tenderness, no masses palpated. No hepatosplenomegaly. Bowel sounds positive.  Musculoskeletal: no clubbing / cyanosis. No joint deformity upper and lower extremities. Good ROM, no contractures. Normal muscle tone.  Skin: no rashes, lesions, ulcers. No induration Neurologic: CN 2-12 grossly intact. Sensation intact, DTR normal. Strength 5/5 in all 4.  Psychiatric: Normal judgment and insight. Alert and oriented x 3. Normal mood.    Labs on Admission: I have personally reviewed following labs and imaging studies  CBC: Recent Labs  Lab 05/27/17 1634  WBC 15.4*  NEUTROABS 8.6*  HGB 8.6*  HCT 25.0*  MCV 84.2  PLT 350*   Basic Metabolic Panel: Recent Labs  Lab 05/27/17 1634  NA 138  K 3.6  CL 106  CO2 25  GLUCOSE 96  BUN 7  CREATININE 0.79  CALCIUM 8.9   GFR: Estimated Creatinine Clearance: 113 mL/min (by C-G formula based on SCr of 0.79 mg/dL). Liver Function Tests: Recent Labs  Lab 05/27/17 1634  AST 20  ALT 14  ALKPHOS 59  BILITOT 1.8*  PROT 7.2  ALBUMIN 4.1   No results for input(s): LIPASE, AMYLASE in the last 168 hours. No results for input(s): AMMONIA in the last 168 hours. Coagulation Profile: No results for input(s): INR, PROTIME in the last 168 hours. Cardiac Enzymes: No results for input(s): CKTOTAL, CKMB, CKMBINDEX, TROPONINI in the last 168 hours. BNP (last 3 results) No results for  input(s): PROBNP in the last 8760 hours. HbA1C: No results for input(s): HGBA1C in the last 72 hours. CBG: No results for input(s): GLUCAP in the last 168 hours. Lipid Profile: No results for input(s): CHOL, HDL, LDLCALC, TRIG, CHOLHDL, LDLDIRECT in the last 72 hours. Thyroid Function Tests: No results for input(s): TSH, T4TOTAL, FREET4, T3FREE, THYROIDAB in the last 72 hours. Anemia Panel: Recent Labs    05/27/17 1634  RETICCTPCT 13.8*   Urine analysis:    Component Value Date/Time   COLORURINE YELLOW 02/02/2017 0027   APPEARANCEUR CLEAR 02/02/2017 0027   APPEARANCEUR Clear 05/06/2012 1546   LABSPEC 1.015 04/27/2017 0908   LABSPEC 1.011 05/06/2012 1546   PHURINE 6.0 04/27/2017 0908   GLUCOSEU NEGATIVE 04/27/2017 0908   GLUCOSEU Negative 05/06/2012 1546  HGBUR NEGATIVE 04/27/2017 0908   HGBUR negative 01/08/2009 1329   BILIRUBINUR NEGATIVE 04/27/2017 0908   BILIRUBINUR Negative 05/06/2012 1546   KETONESUR NEGATIVE 04/27/2017 0908   PROTEINUR NEGATIVE 04/27/2017 0908   UROBILINOGEN 0.2 04/27/2017 0908   NITRITE NEGATIVE 04/27/2017 0908   LEUKOCYTESUR NEGATIVE 04/27/2017 0908   LEUKOCYTESUR Negative 05/06/2012 1546    Radiological Exams on Admission: Dg Chest 2 View  Result Date: 05/27/2017 CLINICAL DATA:  Chest pain EXAM: CHEST  2 VIEW COMPARISON:  02/27/2017 FINDINGS: Right Port-A-Cath in place. Heart is mildly enlarged. No confluent opacities, effusions or edema. IMPRESSION: Mild cardiomegaly.  No active disease. Electronically Signed   By: Rolm Baptise M.D.   On: 05/27/2017 15:00    EKG: Independently reviewed.  Assessment/Plan Principal Problem:   Sickle cell pain crisis ([place]) Active Problems:   Chronic anticoagulation    1. Sickle cell pain crisis - 1. Sickle cell pathway 2. Dilaudid PCA 3. Continue home MS contin 4. Continue hydrea 5. Scheduled toradol 6. Hold oxy 2. H/o DVT/PE - 1. Continue Xarelto 2. No tachycardia, hypoxia, to suggest recurrent  PE at this time... And would be unlikely while on xarelto.  DVT prophylaxis: Xarelto Code Status: Full Family Communication: No family in room Disposition Plan: Home after admit Consults called: None Admission status: Admit to inpatient - inpatient status for PCA   Etta Quill DO [facility] Pager [phone]  If 7AM-7PM, please contact day team taking care of patient [email or web address] Password [facility]  05/27/2017, 9:52 PM

## 2017-05-28 DIAGNOSIS — D638 Anemia in other chronic diseases classified elsewhere: Secondary | ICD-10-CM | POA: Diagnosis not present

## 2017-05-28 DIAGNOSIS — D5700 Hb-SS disease with crisis, unspecified: Principal | ICD-10-CM

## 2017-05-28 DIAGNOSIS — Z7901 Long term (current) use of anticoagulants: Secondary | ICD-10-CM

## 2017-05-28 MED ORDER — OXYCODONE HCL 5 MG PO TABS
10.0000 mg | ORAL_TABLET | ORAL | Status: DC | PRN
  Administered 2017-05-29 – 2017-05-30 (×5): 10 mg via ORAL
  Filled 2017-05-28 (×6): qty 2

## 2017-05-28 NOTE — Progress Notes (Signed)
SICKLE CELL SERVICE PROGRESS NOTE  GEARL KIMBROUGH IRW:[id number] DOB: [birth year] DOA: 05/27/2017 PCP: Scot Jun, FNP  Assessment/Plan: Principal Problem:   Sickle cell pain crisis [facility]) Active Problems:   Chronic anticoagulation  1. Hb SS with Crisis: Continue PCA and schedule Oxycodone on a PRN basis. Decrease IVF to 50 ml/hr.  2. Anemia of Chronic Disease: Currently on Hydrea 1000 mg. However MCV is not consistent with adherence to hydrea for any appreciable length of time.  3. Mild Leukocytosis: No evidence of infection. Leukocytosis occurs with sickle cell crisis as a marker of inflammation seen in vaso-occlusive pathology. 4. Chronic Pain: Pt has chronic pain which she is now managing with alternative therapy (meditation, distraction etc) she uses the MS Contin on a PRN basis when crisis occurs and reports that her last use was with the crisis on 05/06/2107. Increasingly this can pose a problem as she reverses her tolerance and may need to have the dose decreased. 5. Chronic Anticoagulation: Continue Xarelto. 6. Psychosocial: Pt is very proud of her decreased use of opiates and the work that she is doing around her personal development. She acknowledges that she needs to work on how she responds to situations that do not meet her expectations and is looking for some coaching and tools to help with this. I have spent some time with her ion these issues and will refer to the [facility] Cell [facility] for further resources.   Code Status: Full Code Family Communication: N/A Disposition Plan: Not yet ready for discharge  [place].  Pager [phone]. If 7PM-7AM, please contact night-coverage.  05/28/2017, 2:12 PM  LOS: 1 day   Interim History: This is an Opiate naive patient with Hb SS who was admitted with sickle cell crisis. Pt reports that she put herself through a detox process 2 months ago and she has not been taking opiates on a daily basis. Pt started taking MS Contin  BID and Oxycodone q 6 hours PRN in the last 48 hours with the onset of her sickle cell crisis. She initially had good relief but then pain escalated yesterday and was unresponsive to her medications so she presented to ED for further care. Today she reports that she feels better and that she overall has had more energy and clarity of thought since not taking opiates on a daily basis. She reports pain as 6/10 and she used 31 mg of Dilaudid on the PCA with 58/52:demands/deliveries in the last 24 hours. Patient hoping to be discharged tomorrow.   Consultants:  None  Procedures:  None  Antibiotics:  None    Objective: Vitals:   05/28/17 0800 05/28/17 1057 05/28/17 1117 05/28/17 1229  BP:   123/79   Pulse:   96   Resp: 11 11 16 17   Temp:   98.9 F (37.2 C)   TempSrc:   Oral   SpO2: 93%  95% 94%  Weight:      Height:       Weight change:   Intake/Output Summary (Last 24 hours) at 05/28/2017 1412 Last data filed at 05/28/2017 1117 Gross per 24 hour  Intake 1488.07 ml  Output -  Net 1488.07 ml      Physical Exam General: Alert, awake, oriented x3, in mild distress.  HEENT: /AT PEERL, EOMI. anicteric Neck: Trachea midline,  no masses, no thyromegal,y no JVD, no carotid bruit OROPHARYNX:  Moist, No exudate/ erythema/lesions.  Heart: Regular rate and rhythm, without murmurs, rubs, gallops, PMI non-displaced, no heaves or thrills  on palpation.  Lungs: Clear to auscultation, no wheezing or rhonchi noted. No increased vocal fremitus resonant to percussion  Abdomen: Soft, nontender, nondistended, positive bowel sounds, no masses no hepatosplenomegaly noted..  Neuro: No focal neurological deficits noted cranial nerves II through XII grossly intact.  Strength at baseline in bilateral upper and lower extremities. Musculoskeletal: No warmth swelling or erythema around joints, no spinal tenderness noted. Psychiatric: Patient alert and oriented x3, good insight and cognition, good  recent to remote recall.    Data Reviewed: Basic Metabolic Panel: Recent Labs  Lab 05/27/17 1634  NA 138  K 3.6  CL 106  CO2 25  GLUCOSE 96  BUN 7  CREATININE 0.79  CALCIUM 8.9   Liver Function Tests: Recent Labs  Lab 05/27/17 1634  AST 20  ALT 14  ALKPHOS 59  BILITOT 1.8*  PROT 7.2  ALBUMIN 4.1   No results for input(s): LIPASE, AMYLASE in the last 168 hours. No results for input(s): AMMONIA in the last 168 hours. CBC: Recent Labs  Lab 05/27/17 1634  WBC 15.4*  NEUTROABS 8.6*  HGB 8.6*  HCT 25.0*  MCV 84.2  PLT 635*   Cardiac Enzymes: No results for input(s): CKTOTAL, CKMB, CKMBINDEX, TROPONINI in the last 168 hours. BNP (last 3 results) No results for input(s): BNP in the last 8760 hours.  ProBNP (last 3 results) No results for input(s): PROBNP in the last 8760 hours.  CBG: No results for input(s): GLUCAP in the last 168 hours.  No results found for this or any previous visit (from the past 240 hour(s)).   Studies: Dg Chest 2 View  Result Date: 05/27/2017 CLINICAL DATA:  Chest pain EXAM: CHEST  2 VIEW COMPARISON:  02/27/2017 FINDINGS: Right Port-A-Cath in place. Heart is mildly enlarged. No confluent opacities, effusions or edema. IMPRESSION: Mild cardiomegaly.  No active disease. Electronically Signed   By: Rolm Baptise M.D.   On: 05/27/2017 15:00   US Pelvis Transvanginal Non-ob (tv Only)  Result Date: 05/05/2017 CLINICAL DATA:  Menorrhagia EXAM: ULTRASOUND PELVIS TRANSVAGINAL TECHNIQUE: Transvaginal ultrasound examination of the pelvis was performed including evaluation of the uterus, ovaries, adnexal regions, and pelvic cul-de-sac. COMPARISON:  04/03/2016 FINDINGS: Uterus Measurements: Retroverted, 5.5 x 3.4 x 4.4 cm. No fibroids or other mass visualized. Endometrium Thickness: 3 mm in thickness.  No focal abnormality visualized. Right ovary Measurements: 3.0 x 2.4 x 1.8 cm. Normal appearance/no adnexal mass. Left ovary Measurements: 2.6 x 1.2 x  1.5 cm. Normal appearance/no adnexal mass. Other findings:  No abnormal free fluid IMPRESSION: Unremarkable transvaginal pelvic ultrasound. Electronically Signed   By: Rolm Baptise M.D.   On: 05/05/2017 16:29    Scheduled Meds: . HYDROmorphone   Intravenous Q4H  .  HYDROmorphone (DILAUDID) injection  0.5 mg Subcutaneous Once  . hydroxyurea  500 mg Oral BID  . ketorolac  30 mg Intravenous Q6H  . morphine  60 mg Oral Q12H  . multivitamin with minerals  1 tablet Oral Daily  . rivaroxaban  20 mg Oral Q supper  . senna-docusate  1 tablet Oral BID   Continuous Infusions: . dextrose 5 % and 0.45% NaCl 100 mL/hr at 05/28/17 0531  . diphenhydrAMINE      Principal Problem:   Sickle cell pain crisis ([name]) Active Problems:   Chronic anticoagulation   In excess of 45 minutes spent during this visit. Greater than 50% involved face to face contact with the patient for assessment, counseling and coordination of care.

## 2017-05-28 NOTE — Progress Notes (Signed)
Pt expressing her determination to become a better person and better mother for her children. States she has stopped taking drugs and she has a clearer head. States she has no one to share her concerns and no one that gives her support. She is a single mother of 2. Encouraged pt to call the sickle cell clinic and ask about support meetings. Pt stated she also had a counselor she was going to call and get some advise on support groups for single mothers and sickle cell groups.

## 2017-05-28 NOTE — Progress Notes (Signed)
Patient is scratching at port site and tearing up dressing. Patient educated on infection risks and importance of not messing with port site.[name]

## 2017-05-29 ENCOUNTER — Ambulatory Visit: Payer: Self-pay | Admitting: Family Medicine

## 2017-05-29 ENCOUNTER — Inpatient Hospital Stay (HOSPITAL_COMMUNITY): Payer: Medicare Other

## 2017-05-29 DIAGNOSIS — G8929 Other chronic pain: Secondary | ICD-10-CM | POA: Diagnosis not present

## 2017-05-29 DIAGNOSIS — D5700 Hb-SS disease with crisis, unspecified: Secondary | ICD-10-CM | POA: Diagnosis not present

## 2017-05-29 DIAGNOSIS — R509 Fever, unspecified: Secondary | ICD-10-CM | POA: Diagnosis not present

## 2017-05-29 DIAGNOSIS — G43909 Migraine, unspecified, not intractable, without status migrainosus: Secondary | ICD-10-CM | POA: Diagnosis not present

## 2017-05-29 DIAGNOSIS — F633 Trichotillomania: Secondary | ICD-10-CM | POA: Diagnosis not present

## 2017-05-29 DIAGNOSIS — M549 Dorsalgia, unspecified: Secondary | ICD-10-CM | POA: Diagnosis not present

## 2017-05-29 DIAGNOSIS — K219 Gastro-esophageal reflux disease without esophagitis: Secondary | ICD-10-CM | POA: Diagnosis not present

## 2017-05-29 LAB — CBC WITH DIFFERENTIAL/PLATELET
BASOS ABS: 0.1 10*3/uL (ref 0.0–0.1)
Basophils Relative: 0 %
EOS ABS: 1 10*3/uL — AB (ref 0.0–0.7)
EOS PCT: 4 %
HCT: 19.4 % — ABNORMAL LOW (ref 36.0–46.0)
Hemoglobin: 6.8 g/dL — CL (ref 12.0–15.0)
Lymphocytes Relative: 11 %
Lymphs Abs: 2.7 10*3/uL (ref 0.7–4.0)
MCH: 29.3 pg (ref 26.0–34.0)
MCHC: 35.1 g/dL (ref 30.0–36.0)
MCV: 83.6 fL (ref 78.0–100.0)
MONO ABS: 1.5 10*3/uL — AB (ref 0.1–1.0)
Monocytes Relative: 7 %
Neutro Abs: 18.5 10*3/uL — ABNORMAL HIGH (ref 1.7–7.7)
Neutrophils Relative %: 78 %
PLATELETS: 538 10*3/uL — AB (ref 150–400)
RBC: 2.32 MIL/uL — AB (ref 3.87–5.11)
RDW: 20.3 % — AB (ref 11.5–15.5)
WBC: 23.8 10*3/uL — AB (ref 4.0–10.5)

## 2017-05-29 LAB — RETICULOCYTES
RBC.: 2.32 MIL/uL — ABNORMAL LOW (ref 3.87–5.11)
RETIC COUNT ABSOLUTE: 355 10*3/uL — AB (ref 19.0–186.0)
RETIC CT PCT: 15.3 % — AB (ref 0.4–3.1)

## 2017-05-29 MED ORDER — HYDROMORPHONE HCL 1 MG/ML IJ SOLN
1.0000 mg | Freq: Once | INTRAMUSCULAR | Status: AC
  Administered 2017-05-29: 1 mg via INTRAVENOUS

## 2017-05-29 MED ORDER — SODIUM CHLORIDE 0.9 % IV SOLN: Freq: Once | INTRAVENOUS | Status: DC

## 2017-05-29 MED ORDER — MORPHINE SULFATE ER 60 MG PO TBCR: EXTENDED_RELEASE_TABLET | ORAL | 0 refills | Status: DC

## 2017-05-29 MED ORDER — ACETAMINOPHEN 325 MG PO TABS
650.0000 mg | ORAL_TABLET | ORAL | Status: DC | PRN
  Administered 2017-05-29: 650 mg via ORAL
  Filled 2017-05-29: qty 2

## 2017-05-29 MED ORDER — HYDROMORPHONE HCL 1 MG/ML IJ SOLN
1.0000 mg | INTRAMUSCULAR | Status: AC | PRN
  Administered 2017-05-29 – 2017-05-30 (×4): 1 mg via INTRAVENOUS
  Filled 2017-05-29 (×3): qty 1

## 2017-05-29 MED ORDER — SODIUM CHLORIDE 0.9 % IV BOLUS (SEPSIS)
500.0000 mL | Freq: Once | INTRAVENOUS | Status: AC
  Administered 2017-05-29: 500 mL via INTRAVENOUS

## 2017-05-29 NOTE — Progress Notes (Signed)
SICKLE CELL SERVICE PROGRESS NOTE  [name] TDV:[id number] DOB: 07/11/1990 DOA: 05/27/2017 PCP: Scot Jun, FNP  Assessment/Plan: Principal Problem:   Sickle cell pain crisis [facility]) Active Problems:   Chronic anticoagulation  1. Hb SS with Crisis: Continue PCA and Oxycodone on a PRN basis. Decrease IVF to [facility].  2. Anemia of Chronic Disease: Currently on Hydrea 1000 mg. Labs pending from today. 3. Mild Leukocytosis: No evidence of infection. Leukocytosis occurs with sickle cell crisis as a marker of inflammation seen in vaso-occlusive pathology. Labs pending from today.  4. Chronic Pain: Pt has chronic pain which she is now managing with alternative therapy (meditation, distraction etc) she uses the MS Contin on a PRN basis when crisis occurs and reports that her last use was with the crisis on 05/06/2107. Increasingly this can pose a problem as she reverses her tolerance and may need to have the dose decreased. 5. Chronic Anticoagulation: Continue Xarelto. 6. Psychosocial: Pt has 2 young children at home and has no-one to provide care for them while she is hospitalized. She feels that she may have to leave the hospital even though she feels that her pain is not managed sufficiently.    Code Status: Full Code Family Communication: N/A Disposition Plan: Not yet ready for discharge  [place].  Pager [phone]. If 7PM-7AM, please contact night-coverage.  05/29/2017, 12:13 PM  LOS: 2 days   Interim History: Today patient reports pain at an intensity of 8/10 and localized to right knee and leg. She has used 10.2 mg with 19/17:demands/deliveries for the last 11 hours and 36.6 mg for the last 24 hours. She reports that her pain is currently worsening and she feels that she needs to remain hospitalized to manage her pain. However, she has no-one  Who will be able to assist with her children this evening or tomorrow and tells me that she will probably have to leave even  without adequate control of her pain. She is unable to walk at present due to the pain.   Consultants:  None  Procedures:  None  Antibiotics:  None    Objective: Vitals:   05/29/17 0242 05/29/17 0542 05/29/17 0800 05/29/17 1039  BP: 113/65   95/74  Pulse: (!) 106   94  Resp: 12 14 10 10   Temp: 99.3 F (37.4 C)   98.3 F (36.8 C)  TempSrc: Oral   Oral  SpO2: 96% 98% 93% 98%  Weight:      Height:       Weight change:   Intake/Output Summary (Last 24 hours) at 05/29/2017 1213 Last data filed at 05/29/2017 1041 Gross per 24 hour  Intake 2834.1 ml  Output 1800 ml  Net 1034.1 ml      Physical Exam General: Alert, awake, oriented x3, in moderate distress due to pain.   HEENT: [place]/AT PEERL, EOMI. anicteric Neck: Trachea midline,  no masses, no thyromegal,y no JVD, no carotid bruit OROPHARYNX:  Moist, No exudate/ erythema/lesions.  Heart: Regular rate and rhythm, without murmurs, rubs, gallops, PMI non-displaced, no heaves or thrills on palpation.  Lungs: Clear to auscultation, no wheezing or rhonchi noted. No increased vocal fremitus resonant to percussion  Abdomen: Soft, nontender, nondistended, positive bowel sounds, no masses no hepatosplenomegaly noted..  Neuro: No focal neurological deficits noted cranial nerves II through XII grossly intact.  Strength at baseline in bilateral upper and lower extremities. Musculoskeletal: No warmth swelling or erythema around joints, no spinal tenderness noted. Psychiatric: Patient alert and oriented  x3, good insight and cognition, good recent to remote recall. Patient tearful at the reality of not having assistance with her children.     Data Reviewed: Basic Metabolic Panel: Recent Labs  Lab 05/27/17 1634  NA 138  K 3.6  CL 106  CO2 25  GLUCOSE 96  BUN 7  CREATININE 0.79  CALCIUM 8.9   Liver Function Tests: Recent Labs  Lab 05/27/17 1634  AST 20  ALT 14  ALKPHOS 59  BILITOT 1.8*  PROT 7.2  ALBUMIN 4.1   No  results for input(s): LIPASE, AMYLASE in the last 168 hours. No results for input(s): AMMONIA in the last 168 hours. CBC: Recent Labs  Lab 05/27/17 1634  WBC 15.4*  NEUTROABS 8.6*  HGB 8.6*  HCT 25.0*  MCV 84.2  PLT 635*   Cardiac Enzymes: No results for input(s): CKTOTAL, CKMB, CKMBINDEX, TROPONINI in the last 168 hours. BNP (last 3 results) No results for input(s): BNP in the last 8760 hours.  ProBNP (last 3 results) No results for input(s): PROBNP in the last 8760 hours.  CBG: No results for input(s): GLUCAP in the last 168 hours.  No results found for this or any previous visit (from the past 240 hour(s)).   Studies: Dg Chest 2 View  Result Date: 05/27/2017 CLINICAL DATA:  Chest pain EXAM: CHEST  2 VIEW COMPARISON:  02/27/2017 FINDINGS: Right Port-A-Cath in place. Heart is mildly enlarged. No confluent opacities, effusions or edema. IMPRESSION: Mild cardiomegaly.  No active disease. Electronically Signed   By: Rolm Baptise M.D.   On: 05/27/2017 15:00   US Pelvis Transvanginal Non-ob (tv Only)  Result Date: 05/05/2017 CLINICAL DATA:  Menorrhagia EXAM: ULTRASOUND PELVIS TRANSVAGINAL TECHNIQUE: Transvaginal ultrasound examination of the pelvis was performed including evaluation of the uterus, ovaries, adnexal regions, and pelvic cul-de-sac. COMPARISON:  04/03/2016 FINDINGS: Uterus Measurements: Retroverted, 5.5 x 3.4 x 4.4 cm. No fibroids or other mass visualized. Endometrium Thickness: 3 mm in thickness.  No focal abnormality visualized. Right ovary Measurements: 3.0 x 2.4 x 1.8 cm. Normal appearance/no adnexal mass. Left ovary Measurements: 2.6 x 1.2 x 1.5 cm. Normal appearance/no adnexal mass. Other findings:  No abnormal free fluid IMPRESSION: Unremarkable transvaginal pelvic ultrasound. Electronically Signed   By: Rolm Baptise M.D.   On: 05/05/2017 16:29    Scheduled Meds: . HYDROmorphone   Intravenous Q4H  .  HYDROmorphone (DILAUDID) injection  0.5 mg Subcutaneous Once   .  HYDROmorphone (DILAUDID) injection  1 mg Intravenous Once  . hydroxyurea  500 mg Oral BID  . ketorolac  30 mg Intravenous Q6H  . morphine  60 mg Oral Q12H  . multivitamin with minerals  1 tablet Oral Daily  . rivaroxaban  20 mg Oral Q supper  . senna-docusate  1 tablet Oral BID   Continuous Infusions: . dextrose 5 % and 0.45% NaCl 50 mL/hr at 05/29/17 0005  . diphenhydrAMINE      Principal Problem:   Sickle cell pain crisis ([place]) Active Problems:   Chronic anticoagulation   In excess of 30 minutes spent during this visit. Greater than 50% involved face to face contact with the patient for assessment, counseling and coordination of care.

## 2017-05-29 NOTE — Progress Notes (Signed)
CRITICAL VALUE ALERT  Critical Value:  Hgb 6.8  Date & Time Notied:  05/29/17, 12:00  Provider Notified: Dr. Zigmund Daniel  Orders Received/Actions taken: MD aware

## 2017-05-29 NOTE — Progress Notes (Signed)
Patient ID: [name], female   DOB: [birth year], 27 y.o.   MRN: [id number] Late entry from 6:30 pm today.  Spoke with Nurse Josph Macho to check on patients condition after last orders given. Nurse advised that he was preparing patient to take a hot shower as had been ordered by a Physician and communicated by the charge nurse. I spoke with the charge nurse and discussed my concern for this patients safety given her inability to ambulate earlier today when her pain was better controlled. Furthermore Ashlley has a h/o falls in the past while receiving opiates for her pain. Additionally given the degree of pain reported, interrupting her therapy would create a setback in her therapy. Particularly in light of the fact that her goal is to be discharged ASAP as she has no arranged child care for her children. Given all of the above, it is my medical opinion that weighing risk against benefit, that the risk with this patient obtaining a shower would outweigh the benefit. Charge Nurse stated that she will communicate this to the patient.  MATTHEWS,MICHELLE A.

## 2017-05-29 NOTE — Progress Notes (Signed)
Charge RN called into room to speak with pt. Pt crying, states pain is uncontrolled and she feels that if she can get into the warm shower it would help her to relax and manage her pain.  RN called on call MD and explained pt requests.  RN had asked pt if she was stable on her feet and able to walk and get into the shower and pt stated she was.  On call MD stated that it was ok for pt to get into shower.  RN explained to on call MD that port site would be protected during the shower and PCA would be disconnected until shower completion.  This was communicated to the patient's assigned RN.  Shortly after charge RN received call from rounding physician who felt that a shower was not appropriate for this patient and that disconnecting the PCA would interfere with therapy.  Charge RN communicated this conversation with the assigned RN, who spoke to the patient and explained the treatment plan.

## 2017-05-30 ENCOUNTER — Encounter: Payer: Self-pay | Admitting: Internal Medicine

## 2017-05-30 DIAGNOSIS — G894 Chronic pain syndrome: Secondary | ICD-10-CM

## 2017-05-30 DIAGNOSIS — G8929 Other chronic pain: Secondary | ICD-10-CM | POA: Diagnosis not present

## 2017-05-30 DIAGNOSIS — Z659 Problem related to unspecified psychosocial circumstances: Secondary | ICD-10-CM | POA: Diagnosis not present

## 2017-05-30 DIAGNOSIS — F633 Trichotillomania: Secondary | ICD-10-CM | POA: Diagnosis not present

## 2017-05-30 DIAGNOSIS — D638 Anemia in other chronic diseases classified elsewhere: Secondary | ICD-10-CM | POA: Diagnosis not present

## 2017-05-30 DIAGNOSIS — D5700 Hb-SS disease with crisis, unspecified: Secondary | ICD-10-CM | POA: Diagnosis not present

## 2017-05-30 DIAGNOSIS — M549 Dorsalgia, unspecified: Secondary | ICD-10-CM | POA: Diagnosis not present

## 2017-05-30 DIAGNOSIS — K219 Gastro-esophageal reflux disease without esophagitis: Secondary | ICD-10-CM | POA: Diagnosis not present

## 2017-05-30 DIAGNOSIS — G43909 Migraine, unspecified, not intractable, without status migrainosus: Secondary | ICD-10-CM | POA: Diagnosis not present

## 2017-05-30 DIAGNOSIS — Z7901 Long term (current) use of anticoagulants: Secondary | ICD-10-CM | POA: Diagnosis not present

## 2017-05-30 LAB — CBC WITH DIFFERENTIAL/PLATELET
BASOS ABS: 0.1 10*3/uL (ref 0.0–0.1)
Basophils Relative: 0 %
Eosinophils Absolute: 0.3 10*3/uL (ref 0.0–0.7)
Eosinophils Relative: 1 %
HEMATOCRIT: 18.7 % — AB (ref 36.0–46.0)
Hemoglobin: 6.6 g/dL — CL (ref 12.0–15.0)
LYMPHS ABS: 1.5 10*3/uL (ref 0.7–4.0)
LYMPHS PCT: 6 %
MCH: 29.5 pg (ref 26.0–34.0)
MCHC: 35.3 g/dL (ref 30.0–36.0)
MCV: 83.5 fL (ref 78.0–100.0)
MONO ABS: 2.1 10*3/uL — AB (ref 0.1–1.0)
Monocytes Relative: 9 %
NEUTROS ABS: 19.4 10*3/uL — AB (ref 1.7–7.7)
Neutrophils Relative %: 84 %
Platelets: 525 10*3/uL — ABNORMAL HIGH (ref 150–400)
RBC: 2.24 MIL/uL — ABNORMAL LOW (ref 3.87–5.11)
RDW: 20.4 % — ABNORMAL HIGH (ref 11.5–15.5)
WBC: 23.3 10*3/uL — ABNORMAL HIGH (ref 4.0–10.5)

## 2017-05-30 LAB — URINALYSIS, ROUTINE W REFLEX MICROSCOPIC
Bilirubin Urine: NEGATIVE
Glucose, UA: NEGATIVE mg/dL
Hgb urine dipstick: NEGATIVE
Ketones, ur: NEGATIVE mg/dL
Nitrite: NEGATIVE
Protein, ur: NEGATIVE mg/dL
Specific Gravity, Urine: 1.009 (ref 1.005–1.030)
pH: 5 (ref 5.0–8.0)

## 2017-05-30 LAB — BASIC METABOLIC PANEL
ANION GAP: 6 (ref 5–15)
BUN: 7 mg/dL (ref 6–20)
CALCIUM: 8.5 mg/dL — AB (ref 8.9–10.3)
CO2: 26 mmol/L (ref 22–32)
Chloride: 106 mmol/L (ref 101–111)
Creatinine, Ser: 0.6 mg/dL (ref 0.44–1.00)
Glucose, Bld: 116 mg/dL — ABNORMAL HIGH (ref 65–99)
Potassium: 3.7 mmol/L (ref 3.5–5.1)
Sodium: 138 mmol/L (ref 135–145)

## 2017-05-30 LAB — TYPE AND SCREEN
ABO/RH(D): B POS
Antibody Screen: NEGATIVE

## 2017-05-30 LAB — RETICULOCYTES
RBC.: 2.24 MIL/uL — ABNORMAL LOW (ref 3.87–5.11)
RETIC COUNT ABSOLUTE: 320.3 10*3/uL — AB (ref 19.0–186.0)
Retic Ct Pct: 14.3 % — ABNORMAL HIGH (ref 0.4–3.1)

## 2017-05-30 LAB — LACTATE DEHYDROGENASE: LDH: 308 U/L — AB (ref 98–192)

## 2017-05-30 MED ORDER — HYDROXYUREA 500 MG PO CAPS
500.0000 mg | ORAL_CAPSULE | ORAL | Status: DC
  Administered 2017-05-30 – 2017-06-01 (×2): 500 mg via ORAL
  Filled 2017-05-30 (×3): qty 1

## 2017-05-30 MED ORDER — HYDROMORPHONE HCL 1 MG/ML IJ SOLN
1.0000 mg | INTRAMUSCULAR | Status: DC | PRN
  Administered 2017-05-30 – 2017-05-31 (×3): 1 mg via INTRAVENOUS
  Filled 2017-05-30 (×3): qty 1

## 2017-05-30 MED ORDER — OXYCODONE HCL 5 MG PO TABS
10.0000 mg | ORAL_TABLET | ORAL | Status: DC | PRN
  Administered 2017-06-01: 10 mg via ORAL
  Filled 2017-05-30: qty 2

## 2017-05-30 MED ORDER — HYDROXYUREA 500 MG PO CAPS
1000.0000 mg | ORAL_CAPSULE | ORAL | Status: DC
  Administered 2017-05-31: 1000 mg via ORAL
  Filled 2017-05-30: qty 2

## 2017-05-30 MED ORDER — HYDROMORPHONE HCL 1 MG/ML IJ SOLN
1.0000 mg | INTRAMUSCULAR | Status: AC
  Administered 2017-05-30 (×4): 1 mg via INTRAVENOUS
  Filled 2017-05-30 (×3): qty 1

## 2017-05-30 NOTE — Clinical Social Work Note (Addendum)
Discussed pt's case with DSS. Respite foster care not an option unless "pt were to legally abandon the children, and then DSS would go through the process to reassign custody to family or to the state." Pt is not wanting to relinquish custody.  DSS advised to have pt call the voucher daycare assistance line [phone]) and follow up on her application or initiate new one if needed. States getting approved for daycare voucher is process and that this could be useful in the future. In the meantime, gave resources for 2 affordable childcare options in [employer]. ([place] and [place]). Pt states she has applied with [facility] and is following up with caseworker there to check her status on waiting list. Will call [facility] today to inquire as to process there. Pt states tomorrow, her mother will likely "call out of work again, but I need these things in place to help next time." CSW will check in with pt as needed for assistance during admission.  Clinical Social Work Assessment  Patient Details  Name: [name] MRN: [id number] Date of Birth: February 29, 1991  Date of referral:  05/30/17               Reason for consult:  Family Concerns(childcare)                Permission sought to share information with:  Family Supports, Other Permission granted to share information::  Yes, Verbal Permission Granted  Name::     mother Dietitian::  CPS  Relationship::     Contact Information:     Housing/Transportation Living arrangements for the past 2 months:  Apartment Source of Information:  Patient, Medical Team Patient Interpreter Needed:  None Criminal Activity/Legal Involvement Pertinent to Current Situation/Hospitalization:  No - Comment as needed Significant Relationships:  Dependent Children, Other Family Members, Parents Lives with:  Self, Minor Children Do you feel safe going back to the place where you live?  Yes Need for family  participation in patient care:  No (Coment)  Care giving concerns:  Pt from home where she resides with her 2 children ages 58 months-female  and 6 years old- female. Is caretaker for both of them. Has support from her mother and siblings nearby. Pt reports that, tomorrow, if her mother isn't able to take another day off of work, there will be no one to keep her children during the day. Pt wants to leave hospital AMA in order to care for her children. Has been in similar situation several times before.   Social Worker assessment / plan:  CSW consulted to assist with childcare concerns. See above. Met with pt at bedside- she is alert and welcoming of CSW involvement. Pt and CSW discussed seeking CPS assistance with pt's situation. Pt agreeable, stating that "If she could get DSS vouchers for childcare during the day, my mom or sister can keep them at night until I'm home from the hospital. Also discussed that there may be resource of voluntary foster care placement- will discuss with DSS. Pt agreeable but states her preference would be for daycare.  Pt receives disability income of $741/month which she states pays her rent and bills. Is hoping to find employment in the next few months as well and states she will need childcare then also.  Children's fathers are not involved- daughter's father incarcerated and son's father "not around at all" therefore neither available for assistance.   Discussed with attending and  CSW Mudlogger. Called DSS- on hold in hotline for 44 minutes. Called and left message at an extension. Will continue calling to investigate resources. Per attending, sickle cell clinic also working to gather resources available to pt, if any.  Will follow.   Employment status:  Disabled (Comment on whether or not currently receiving Disability)(receives disability) Insurance information:  Medicare PT Recommendations:  (n/a) Information / Referral to community resources:      Patient/Family's Response to care:  Pt appreciative  Patient/Family's Understanding of and Emotional Response to Diagnosis, Current Treatment, and Prognosis:  Pt shows good understanding of her treatment and plan and acknowledges barriers. Is emotionally calm but reports worry.  Is restless and seems in pain  Emotional Assessment Appearance:  Appears stated age Attitude/Demeanor/Rapport:  Engaged Affect (typically observed):  Restless Orientation:  Oriented to Self, Oriented to Place, Oriented to  Time, Oriented to Situation Alcohol / Substance use:  Not Applicable Psych involvement (Current and /or in the community):  No (Comment)  Discharge Needs  Concerns to be addressed:  Childcare Concerns Readmission within the last 30 days:  Yes Current discharge risk:    Barriers to Discharge:  Continued Medical Work up   [employer], LCSW 05/30/2017, 11:52 AM  [phone]

## 2017-05-30 NOTE — Consult Note (Signed)
   [facility] CM Inpatient Consult   05/30/2017  ELYSHA DAW [birth year] [id number]    Spoke with Ms. Courtois to discuss re-engagement with [place] Management program. Discussed [facility] RN HealthCoach has been attempting to reach her without success.   Please see chart review tab then encounters for patient outreach details.  Ms. Finfrock reports she has had phone issues recently. Confirmed best contact number is [phone].  Confirmed Primary Care MD is with [facility].   Ms. Norlander agreeable to [place] Management program services.   Discussed referral for [employer].   Made inpatient RNCM aware of above notes.   Active [facility] Care Management consent remains on file.   Will refer to [place].    Marthenia Rolling, MSN-Ed, RN,BSN [facility] Liaison [phone]

## 2017-05-30 NOTE — Care Management Important Message (Signed)
Important Message  Patient Details  Name: [name] MRN: [id number] Date of Birth: 05-20-1990   Medicare Important Message Given:  Yes    Kerin Salen 05/30/2017, 12:11 PMImportant Message  Patient Details  Name: [name] MRN: [id number] Date of Birth: 1990-10-12   Medicare Important Message Given:  Yes    Kerin Salen 05/30/2017, 12:11 PM

## 2017-05-30 NOTE — Progress Notes (Signed)
SICKLE CELL SERVICE PROGRESS NOTE  KARLEY PHO ZDG:[id number] DOB: 1990/06/27 DOA: 05/27/2017 PCP: Scot Jun, FNP  Assessment/Plan: Principal Problem:   Sickle cell pain crisis [facility]) Active Problems:   Chronic anticoagulation  1. Hb SS with Crisis: Continue PCA and contiue Oxycodone on a PRN basis and schedule clinician assisted doses for the next 8 hours.   2. Anemia of Chronic Disease: Hb has decreased as has reticulocytosis. Will decrease dose of Hydrea to alternating doses of 1000 mg and 500 mg every other day.  3. Mild Leukocytosis: No evidence of infection. Leukocytosis occurs with sickle cell crisis as a marker of inflammation seen in vaso-occlusive pathology. Labs pending from today.  4. Chronic Pain: Pt has chronic pain which she is now managing with alternative therapy (meditation, distraction etc) she uses the MS Contin on a PRN basis when crisis occurs and reports that her last use was with the crisis on 05/06/2107. Increasingly this can pose a problem as she reverses her tolerance and may need to have the dose decreased. 5. Chronic Anticoagulation: Continue Xarelto. 6. Psychosocial: Pt has 2 young children at home and has no-one to provide care for them while she is hospitalized. Social Work has been consulted to assist patient and are working on a plan. As well the Sick[facility] Cell Agency is working to try to arrange Emergency Day Care Services to assist.  7.   Code Status: Full Code Family Communication: N/A Disposition Plan: Not yet ready for discharge  [place].  Pager [phone]. If 7PM-7AM, please contact night-coverage.  05/30/2017, 10:22 AM  LOS: 3 days   Interim History: Today patient reports pain at an intensity of 8/10 and localized to BLE's and LUE. She has used 36.59 mg of IV Dilaudid  with 65/58:demands/deliveries in the last 24 hours. She is still  unable to walk at present due to the pain. 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 She is receptive to assistance from al sources including DSS if necessary.   Consultants:  None  Procedures:  None  Antibiotics:  None    Objective: Vitals:   05/30/17 0002 05/30/17 0459 05/30/17 0529 05/30/17 0812  BP: 99/64 118/72    Pulse: (!) 103 (!) 111    Resp: 16 13 18 12   Temp: 99.8 F (37.7 C) 99.9 F (37.7 C)    TempSrc: Oral Oral    SpO2: 97% 100% 100% 96%  Weight: 95.3 kg (210 lb)     Height:       Weight change:   Intake/Output Summary (Last 24 hours) at 05/30/2017 1022 Last data filed at 05/30/2017 0534 Gross per 24 hour  Intake 1261.14 ml  Output 2600 ml  Net -1338.86 ml      Physical Exam General: Alert, awake, oriented x3, in moderate to severe distress due to pain.   HEENT: [place]/AT PEERL, EOMI. anicteric Neck: Trachea midline,  no masses, no thyromegal,y no JVD, no carotid bruit  Heart: Regular rate and rhythm, without murmurs, rubs, gallops, PMI non-displaced, no heaves or thrills on palpation.  Lungs: Clear to auscultation, no wheezing or rhonchi noted. No increased vocal fremitus resonant to percussion  Abdomen: Soft, nontender, nondistended, positive bowel sounds, no masses no hepatosplenomegaly noted..  Neuro: No focal neurological deficits noted cranial nerves II through XII grossly intact.  Strength at baseline in bilateral upper and lower extremities. Musculoskeletal: No warmth swelling or erythema around joints, no spinal tenderness noted. Psychiatric: Patient alert  and oriented x3, good insight and cognition, good recent to remote recall.      Data Reviewed: Basic Metabolic Panel: Recent Labs  Lab 05/27/17 1634 05/30/17 0527  NA 138 138  K 3.6 3.7  CL 106 106  CO2 25 26  GLUCOSE 96 116*  BUN 7 7  CREATININE 0.79 0.60  CALCIUM 8.9 8.5*   Liver Function Tests: Recent Labs  Lab 05/27/17 1634  AST 20  ALT 14  ALKPHOS 59  BILITOT 1.8*  PROT 7.2  ALBUMIN 4.1   No results for input(s): LIPASE,  AMYLASE in the last 168 hours. No results for input(s): AMMONIA in the last 168 hours. CBC: Recent Labs  Lab 05/27/17 1634 05/29/17 1115 05/30/17 0527  WBC 15.4* 23.8* 23.3*  NEUTROABS 8.6* 18.5* 19.4*  HGB 8.6* 6.8* 6.6*  HCT 25.0* 19.4* 18.7*  MCV 84.2 83.6 83.5  PLT 635* 538* 525*   Cardiac Enzymes: No results for input(s): CKTOTAL, CKMB, CKMBINDEX, TROPONINI in the last 168 hours. BNP (last 3 results) No results for input(s): BNP in the last 8760 hours.  ProBNP (last 3 results) No results for input(s): PROBNP in the last 8760 hours.  CBG: No results for input(s): GLUCAP in the last 168 hours.  No results found for this or any previous visit (from the past 240 hour(s)).   Studies: Dg Chest 2 View  Result Date: 05/27/2017 CLINICAL DATA:  Chest pain EXAM: CHEST  2 VIEW COMPARISON:  02/27/2017 FINDINGS: Right Port-A-Cath in place. Heart is mildly enlarged. No confluent opacities, effusions or edema. IMPRESSION: Mild cardiomegaly.  No active disease. Electronically Signed   By: Rolm Baptise M.D.   On: 05/27/2017 15:00   US Pelvis Transvanginal Non-ob (tv Only)  Result Date: 05/05/2017 CLINICAL DATA:  Menorrhagia EXAM: ULTRASOUND PELVIS TRANSVAGINAL TECHNIQUE: Transvaginal ultrasound examination of the pelvis was performed including evaluation of the uterus, ovaries, adnexal regions, and pelvic cul-de-sac. COMPARISON:  04/03/2016 FINDINGS: Uterus Measurements: Retroverted, 5.5 x 3.4 x 4.4 cm. No fibroids or other mass visualized. Endometrium Thickness: 3 mm in thickness.  No focal abnormality visualized. Right ovary Measurements: 3.0 x 2.4 x 1.8 cm. Normal appearance/no adnexal mass. Left ovary Measurements: 2.6 x 1.2 x 1.5 cm. Normal appearance/no adnexal mass. Other findings:  No abnormal free fluid IMPRESSION: Unremarkable transvaginal pelvic ultrasound. Electronically Signed   By: Rolm Baptise M.D.   On: 05/05/2017 16:29   Dg Chest Port 1 View  Result Date:  05/29/2017 CLINICAL DATA:  Fever EXAM: PORTABLE CHEST 1 VIEW COMPARISON:  05/27/2017 chest radiograph. FINDINGS: Right internal jugular MediPort terminates in the lower third of the superior vena cava. Stable cardiomediastinal silhouette with top-normal heart size. No pneumothorax. No pleural effusion. Cephalization of the pulmonary vasculature without overt pulmonary edema. No acute consolidative airspace disease. IMPRESSION: Top-normal heart size and cephalized pulmonary vasculature, without overt pulmonary edema. No acute consolidative airspace disease to suggest a pneumonia. Electronically Signed   By: Ilona Sorrel M.D.   On: 05/29/2017 21:12    Scheduled Meds: . HYDROmorphone   Intravenous Q4H  .  HYDROmorphone (DILAUDID) injection  0.5 mg Subcutaneous Once  . hydroxyurea  500 mg Oral BID  . ketorolac  30 mg Intravenous Q6H  . morphine  60 mg Oral Q12H  . multivitamin with minerals  1 tablet Oral Daily  . rivaroxaban  20 mg Oral Q supper  . senna-docusate  1 tablet Oral BID   Continuous Infusions: . sodium chloride Stopped (05/29/17 2210)  . dextrose 5 %  and 0.45% NaCl 10 mL/hr at 05/30/17 0349  . diphenhydrAMINE      Principal Problem:   Sickle cell pain crisis ([place]) Active Problems:   Chronic anticoagulation   In excess of 30 minutes spent during this visit. Greater than 50% involved face to face contact with the patient for assessment, counseling and coordination of care.

## 2017-05-31 DIAGNOSIS — D638 Anemia in other chronic diseases classified elsewhere: Secondary | ICD-10-CM | POA: Diagnosis not present

## 2017-05-31 DIAGNOSIS — D5700 Hb-SS disease with crisis, unspecified: Secondary | ICD-10-CM | POA: Diagnosis not present

## 2017-05-31 DIAGNOSIS — G894 Chronic pain syndrome: Secondary | ICD-10-CM | POA: Diagnosis not present

## 2017-05-31 DIAGNOSIS — Z7901 Long term (current) use of anticoagulants: Secondary | ICD-10-CM | POA: Diagnosis not present

## 2017-05-31 DIAGNOSIS — D72829 Elevated white blood cell count, unspecified: Secondary | ICD-10-CM

## 2017-05-31 DIAGNOSIS — K219 Gastro-esophageal reflux disease without esophagitis: Secondary | ICD-10-CM | POA: Diagnosis not present

## 2017-05-31 DIAGNOSIS — F633 Trichotillomania: Secondary | ICD-10-CM | POA: Diagnosis not present

## 2017-05-31 DIAGNOSIS — G43909 Migraine, unspecified, not intractable, without status migrainosus: Secondary | ICD-10-CM | POA: Diagnosis not present

## 2017-05-31 DIAGNOSIS — M549 Dorsalgia, unspecified: Secondary | ICD-10-CM | POA: Diagnosis not present

## 2017-05-31 DIAGNOSIS — G8929 Other chronic pain: Secondary | ICD-10-CM | POA: Diagnosis not present

## 2017-05-31 MED ORDER — BISACODYL 10 MG RE SUPP
10.0000 mg | Freq: Once | RECTAL | Status: AC
  Administered 2017-06-01: 10 mg via RECTAL
  Filled 2017-05-31: qty 1

## 2017-05-31 NOTE — Progress Notes (Signed)
SICKLE CELL SERVICE PROGRESS NOTE  [name] NUU:[id number] DOB: May 27, 1990 DOA: 05/27/2017 PCP: Scot Jun, FNP  Assessment/Plan: Principal Problem:   Sickle cell pain crisis [facility]) Active Problems:   Chronic anticoagulation  1. Hb SS with Crisis: Start weaning the PCA and contiue Oxycodone on a PRN basis in addition to scheduled Toradol.  Discontinue clinician assisted doses of Dilaudid. 2. Anemia of Chronic Disease: Reticulocytosis seems to have improved with decrease dose of Hydrea to alternating doses of 1000 mg and 500 mg every other day.  We will continue the Hydrea at this dose and continue to monitor hemoglobin.  Patient is asymptomatic from the anemia 3. Mild Leukocytosis: No evidence of infection. Leukocytosis occurs with sickle cell crisis as a marker of inflammation seen in vaso-occlusive pathology. Labs pending from today.  4. Chronic Pain: Pt has chronic pain which she is now managing with alternative therapy (meditation, distraction etc) she uses the MS Contin on a PRN basis when crisis occurs and reports that her last use was with the crisis on 05/06/2107. Increasingly this can pose a problem as she reverses her tolerance and may need to have the dose decreased. 5. Chronic Anticoagulation: Continue Xarelto. 6. Psychosocial: Pt has 2 young children at home and as of yesterday evening childcare has been successfully arranged for the patient's children 7. 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  Code Status: Full Code Family Communication: N/A Disposition Plan: Anticipate discharge home tomorrow  [place] A.  Pager [phone]. If 7PM-7AM, please contact night-coverage.  05/31/2017, 5:41 PM  LOS: 4 days   Interim History: Today patient reports pain at an intensity of 6/10 and localized to right knee and ankle.  She has used 49.5 mg of IV Dilaudid PCA in the last 24 hours. She was able to ambulate this morning.  Issue  of care for her children has been resolved and this seems to have created some calming the patient with a positive impact on her pain.  We have discussed disposition and I anticipate that the patient would be able to be discharged tomorrow.  Consultants:  None  Procedures:  None  Antibiotics:  None    Objective: Vitals:   05/31/17 1011 05/31/17 1207 05/31/17 1357 05/31/17 1625  BP: 119/74  117/80   Pulse: 86  96   Resp: 12 12 11 16   Temp: 98.6 F (37 C)  99.1 F (37.3 C)   TempSrc: Oral  Oral   SpO2: 99% 100% 100% 98%  Weight:      Height:       Weight change: 1.814 kg (4 lb)  Intake/Output Summary (Last 24 hours) at 05/31/2017 1741 Last data filed at 05/31/2017 1400 Gross per 24 hour  Intake 1404.33 ml  Output -  Net 1404.33 ml      Physical Exam General: Alert, awake, oriented x3, in mild distress due to pain HEENT: [place]/AT PEERL, EOMI. anicteric Neck: Trachea midline,  no masses, no thyromegal,y no JVD, no carotid bruit  Heart: Regular rate and rhythm, without murmurs, rubs, gallops, PMI non-displaced, no heaves or thrills on palpation.  Lungs: Clear to auscultation, no wheezing or rhonchi noted. No increased vocal fremitus resonant to percussion  Abdomen: Soft, nontender, nondistended, positive bowel sounds, no masses no hepatosplenomegaly noted..  Neuro: No focal neurological deficits noted cranial nerves II through XII grossly intact.  Strength at baseline in bilateral upper and lower extremities. Musculoskeletal: No warmth swelling or erythema around joints, no  spinal tenderness noted. Psychiatric: Patient alert and oriented x3, good insight and cognition, good recent to remote recall.      Data Reviewed: Basic Metabolic Panel: Recent Labs  Lab 05/27/17 1634 05/30/17 0527  NA 138 138  K 3.6 3.7  CL 106 106  CO2 25 26  GLUCOSE 96 116*  BUN 7 7  CREATININE 0.79 0.60  CALCIUM 8.9 8.5*   Liver Function Tests: Recent Labs  Lab 05/27/17 1634  AST  20  ALT 14  ALKPHOS 59  BILITOT 1.8*  PROT 7.2  ALBUMIN 4.1   No results for input(s): LIPASE, AMYLASE in the last 168 hours. No results for input(s): AMMONIA in the last 168 hours. CBC: Recent Labs  Lab 05/27/17 1634 05/29/17 1115 05/30/17 0527  WBC 15.4* 23.8* 23.3*  NEUTROABS 8.6* 18.5* 19.4*  HGB 8.6* 6.8* 6.6*  HCT 25.0* 19.4* 18.7*  MCV 84.2 83.6 83.5  PLT 635* 538* 525*   Cardiac Enzymes: No results for input(s): CKTOTAL, CKMB, CKMBINDEX, TROPONINI in the last 168 hours. BNP (last 3 results) No results for input(s): BNP in the last 8760 hours.  ProBNP (last 3 results) No results for input(s): PROBNP in the last 8760 hours.  CBG: No results for input(s): GLUCAP in the last 168 hours.  Recent Results (from the past 240 hour(s))  Culture, blood (routine x 2)     Status: None (Preliminary result)   Collection Time: 05/29/17  8:54 PM  Result Value Ref Range Status   Specimen Description BLOOD LEFT ARM  Final   Special Requests   Final    BOTTLES DRAWN AEROBIC ONLY Blood Culture adequate volume   Culture   Final    NO GROWTH 2 DAYS Performed at [facility], [address], [place], [place] [address]    Report Status PENDING  Incomplete  Culture, blood (routine x 2)     Status: None (Preliminary result)   Collection Time: 05/29/17  9:04 PM  Result Value Ref Range Status   Specimen Description BLOOD LEFT ANTECUBITAL  Final   Special Requests   Final    BOTTLES DRAWN AEROBIC ONLY Blood Culture adequate volume   Culture   Final    NO GROWTH 2 DAYS Performed at [facility], [place] [address] [place], [place] [address]    Report Status PENDING  Incomplete     Studies: Dg Chest 2 View  Result Date: 05/27/2017 CLINICAL DATA:  Chest pain EXAM: CHEST  2 VIEW COMPARISON:  02/27/2017 FINDINGS: Right Port-A-Cath in place. Heart is mildly enlarged. No confluent opacities, effusions or edema. IMPRESSION: Mild cardiomegaly.  No active disease. Electronically  Signed   By: Rolm Baptise M.D.   On: 05/27/2017 15:00   US Pelvis Transvanginal Non-ob (tv Only)  Result Date: 05/05/2017 CLINICAL DATA:  Menorrhagia EXAM: ULTRASOUND PELVIS TRANSVAGINAL TECHNIQUE: Transvaginal ultrasound examination of the pelvis was performed including evaluation of the uterus, ovaries, adnexal regions, and pelvic cul-de-sac. COMPARISON:  04/03/2016 FINDINGS: Uterus Measurements: Retroverted, 5.5 x 3.4 x 4.4 cm. No fibroids or other mass visualized. Endometrium Thickness: 3 mm in thickness.  No focal abnormality visualized. Right ovary Measurements: 3.0 x 2.4 x 1.8 cm. Normal appearance/no adnexal mass. Left ovary Measurements: 2.6 x 1.2 x 1.5 cm. Normal appearance/no adnexal mass. Other findings:  No abnormal free fluid IMPRESSION: Unremarkable transvaginal pelvic ultrasound. Electronically Signed   By: Rolm Baptise M.D.   On: 05/05/2017 16:29   Dg Chest [facility] 1 View  Result  Date: 05/29/2017 CLINICAL DATA:  Fever EXAM: PORTABLE CHEST 1 VIEW COMPARISON:  05/27/2017 chest radiograph. FINDINGS: Right internal jugular MediPort terminates in the lower third of the superior vena cava. Stable cardiomediastinal silhouette with top-normal heart size. No pneumothorax. No pleural effusion. Cephalization of the pulmonary vasculature without overt pulmonary edema. No acute consolidative airspace disease. IMPRESSION: Top-normal heart size and cephalized pulmonary vasculature, without overt pulmonary edema. No acute consolidative airspace disease to suggest a pneumonia. Electronically Signed   By: Ilona Sorrel M.D.   On: 05/29/2017 21:12    Scheduled Meds: . HYDROmorphone   Intravenous Q4H  . hydroxyurea  1,000 mg Oral QODAY   And  . hydroxyurea  500 mg Oral QODAY  . ketorolac  30 mg Intravenous Q6H  . morphine  60 mg Oral Q12H  . multivitamin with minerals  1 tablet Oral Daily  . rivaroxaban  20 mg Oral Q supper  . senna-docusate  1 tablet Oral BID   Continuous Infusions: . sodium  chloride Stopped (05/29/17 2210)  . dextrose 5 % and 0.45% NaCl 10 mL/hr at 05/30/17 0349  . diphenhydrAMINE      Principal Problem:   Sickle cell pain crisis ([place]) Active Problems:   Chronic anticoagulation   In excess of 30 minutes spent during this visit. Greater than 50% involved face to face contact with the patient for assessment, counseling and coordination of care.

## 2017-06-01 DIAGNOSIS — M549 Dorsalgia, unspecified: Secondary | ICD-10-CM | POA: Diagnosis not present

## 2017-06-01 DIAGNOSIS — D72829 Elevated white blood cell count, unspecified: Secondary | ICD-10-CM | POA: Diagnosis not present

## 2017-06-01 DIAGNOSIS — F633 Trichotillomania: Secondary | ICD-10-CM | POA: Diagnosis not present

## 2017-06-01 DIAGNOSIS — D5700 Hb-SS disease with crisis, unspecified: Secondary | ICD-10-CM | POA: Diagnosis not present

## 2017-06-01 DIAGNOSIS — K219 Gastro-esophageal reflux disease without esophagitis: Secondary | ICD-10-CM | POA: Diagnosis not present

## 2017-06-01 DIAGNOSIS — G8929 Other chronic pain: Secondary | ICD-10-CM | POA: Diagnosis not present

## 2017-06-01 DIAGNOSIS — Z7901 Long term (current) use of anticoagulants: Secondary | ICD-10-CM | POA: Diagnosis not present

## 2017-06-01 DIAGNOSIS — D638 Anemia in other chronic diseases classified elsewhere: Secondary | ICD-10-CM | POA: Diagnosis not present

## 2017-06-01 DIAGNOSIS — D473 Essential (hemorrhagic) thrombocythemia: Secondary | ICD-10-CM | POA: Diagnosis not present

## 2017-06-01 DIAGNOSIS — G43909 Migraine, unspecified, not intractable, without status migrainosus: Secondary | ICD-10-CM | POA: Diagnosis not present

## 2017-06-01 LAB — CBC WITH DIFFERENTIAL/PLATELET
Basophils Absolute: 0 10*3/uL (ref 0.0–0.1)
Basophils Relative: 0 %
EOS ABS: 0.6 10*3/uL (ref 0.0–0.7)
Eosinophils Relative: 4 %
HCT: 17.3 % — ABNORMAL LOW (ref 36.0–46.0)
HEMOGLOBIN: 6.1 g/dL — AB (ref 12.0–15.0)
LYMPHS PCT: 19 %
Lymphs Abs: 2.7 10*3/uL (ref 0.7–4.0)
MCH: 30.5 pg (ref 26.0–34.0)
MCHC: 35.3 g/dL (ref 30.0–36.0)
MCV: 86.5 fL (ref 78.0–100.0)
MONO ABS: 1 10*3/uL (ref 0.1–1.0)
MONOS PCT: 7 %
NEUTROS ABS: 9.8 10*3/uL — AB (ref 1.7–7.7)
NRBC: 22 /100{WBCs} — AB
Neutrophils Relative %: 70 %
PLATELETS: 464 10*3/uL — AB (ref 150–400)
RBC: 2 MIL/uL — AB (ref 3.87–5.11)
RDW: 20.6 % — ABNORMAL HIGH (ref 11.5–15.5)
WBC: 14.1 10*3/uL — AB (ref 4.0–10.5)

## 2017-06-01 LAB — BASIC METABOLIC PANEL
Anion gap: 4 — ABNORMAL LOW (ref 5–15)
BUN: 8 mg/dL (ref 6–20)
CALCIUM: 8.4 mg/dL — AB (ref 8.9–10.3)
CHLORIDE: 108 mmol/L (ref 101–111)
CO2: 27 mmol/L (ref 22–32)
CREATININE: 0.59 mg/dL (ref 0.44–1.00)
GFR calc Af Amer: >60 mL/min (ref >60)
GFR calc non Af Amer: >60 mL/min (ref >60)
Glucose, Bld: 98 mg/dL (ref 65–99)
Potassium: 3.7 mmol/L (ref 3.5–5.1)
Sodium: 139 mmol/L (ref 135–145)

## 2017-06-01 LAB — RETICULOCYTES
RBC.: 2 MIL/uL — AB (ref 3.87–5.11)
RETIC CT PCT: 21.9 % — AB (ref 0.4–3.1)
Retic Count, Absolute: 438 10*3/uL — ABNORMAL HIGH (ref 19.0–186.0)

## 2017-06-01 MED ORDER — HYDROXYUREA 500 MG PO CAPS: ORAL_CAPSULE | ORAL | 3 refills | Status: DC

## 2017-06-01 NOTE — Discharge Summary (Signed)
[name] MRN: [id number] DOB/AGE: [birth year] 26 y.o.  Admit date: 05/27/2017 Discharge date: 06/01/2017  Primary Care Physician:  Scot Jun, FNP   Discharge Diagnoses:   Patient Active Problem List   Diagnosis Date Noted  . Major depression, chronic 01/06/2011    Priority: High  . Menorrhagia   . Leukocytosis   . Chronic anticoagulation   . Sickle cell anemia ([place]) 05/19/2016  . History of DVT (deep vein thrombosis) 04/17/2016  . Thrombocytosis ([place]) 11/09/2015  . Hyperbilirubinemia 11/09/2015  . Chronic pain 08/04/2015  . Herpes simplex 07/14/2015  . Anemia of chronic disease   . 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     Latex Rash      Medication List    TAKE these medications   aspirin-acetaminophen-caffeine 250-250-65 MG tablet Commonly known as:  EXCEDRIN MIGRAINE Take 2 tablets every 6 (six) hours as needed by mouth for headache.   ENERGY CHEWS PO Take 2 tablets daily by mouth.   hydroxyurea 500 MG capsule Commonly known as:  HYDREA Take 2 tabs (1000 mg ) PO QOD starting on 06/02/2017 alternating with 1 tab (500 mg) PO QOD starting on 06/03/2017. May take with food to minimize GI side effects. What changed:    how much to take  how to take this  when to take this  additional instructions   medroxyPROGESTERone 150 MG/ML injection Commonly known as:  DEPO-PROVERA Inject 1 mL (150 mg total) into the muscle every 3 (three) months.   morphine 60 MG 12 hr tablet Commonly known as:  MS CONTIN MS Contin 60 mg PO BID x 2 days then MS Contin 60 mg po q HS x 2 days then resume previous schedule of MS Contin 60 mg PO BID PRN crisis. What changed:    how much to take  how to take this  when to take this  reasons to take this  additional instructions   multivitamin  with minerals Tabs tablet Take 1 tablet daily by mouth.   Oxycodone HCl 20 MG Tabs Take 20 mg every 4 (four) hours as needed by mouth (pain).   rivaroxaban 20 MG Tabs tablet Commonly known as:  XARELTO Take 1 tablet (20 mg total) by mouth daily with supper.         Consults:    SIGNIFICANT DIAGNOSTIC STUDIES:  Dg Chest 2 View  Result Date: 05/27/2017 CLINICAL DATA:  Chest pain EXAM: CHEST  2 VIEW COMPARISON:  02/27/2017 FINDINGS: Right Port-A-Cath in place. Heart is mildly enlarged. No confluent opacities, effusions or edema. IMPRESSION: Mild cardiomegaly.  No active disease. Electronically Signed   By: Rolm Baptise M.D.   On: 05/27/2017 15:00   US Pelvis Transvanginal Non-ob (tv Only)  Result Date: 05/05/2017 CLINICAL DATA:  Menorrhagia EXAM: ULTRASOUND PELVIS TRANSVAGINAL TECHNIQUE: Transvaginal ultrasound examination of the pelvis was performed including evaluation of the uterus, ovaries, adnexal regions, and pelvic cul-de-sac. COMPARISON:  04/03/2016 FINDINGS: Uterus Measurements: Retroverted, 5.5 x 3.4 x 4.4 cm. No fibroids or other mass visualized. Endometrium Thickness: 3 mm in thickness.  No focal abnormality visualized. Right ovary Measurements: 3.0 x 2.4 x 1.8 cm. Normal appearance/no adnexal mass. Left ovary Measurements: 2.6 x 1.2 x 1.5 cm. Normal appearance/no adnexal mass. Other findings:  No abnormal free fluid IMPRESSION: Unremarkable transvaginal pelvic  ultrasound. Electronically Signed   By: Rolm Baptise M.D.   On: 05/05/2017 16:29   Dg Chest Port 1 View  Result Date: 05/29/2017 CLINICAL DATA:  Fever EXAM: PORTABLE CHEST 1 VIEW COMPARISON:  05/27/2017 chest radiograph. FINDINGS: Right internal jugular MediPort terminates in the lower third of the superior vena cava. Stable cardiomediastinal silhouette with top-normal heart size. No pneumothorax. No pleural effusion. Cephalization of the pulmonary vasculature without overt pulmonary edema. No acute consolidative  airspace disease. IMPRESSION: Top-normal heart size and cephalized pulmonary vasculature, without overt pulmonary edema. No acute consolidative airspace disease to suggest a pneumonia. Electronically Signed   By: Ilona Sorrel M.D.   On: 05/29/2017 21:12       Recent Results (from the past 240 hour(s))  Culture, blood (routine x 2)     Status: None (Preliminary result)   Collection Time: 05/29/17  8:54 PM  Result Value Ref Range Status   Specimen Description BLOOD LEFT ARM  Final   Special Requests   Final    BOTTLES DRAWN AEROBIC ONLY Blood Culture adequate volume   Culture   Final    NO GROWTH 3 DAYS Performed at [facility], [address] [place], [place] [address]    Report Status PENDING  Incomplete  Culture, blood (routine x 2)     Status: None (Preliminary result)   Collection Time: 05/29/17  9:04 PM  Result Value Ref Range Status   Specimen Description BLOOD LEFT ANTECUBITAL  Final   Special Requests   Final    BOTTLES DRAWN AEROBIC ONLY Blood Culture adequate volume   Culture   Final    NO GROWTH 3 DAYS Performed at [facility], [place] [address] [place], [place] [address]    Report Status PENDING  Incomplete    BRIEF ADMITTING H & P: [name] is a 27 y.o. female with medical history significant of HGB SS disease, chronic pain.  Patient presents to the ED with c/o chest pain, progressively worsening.  Onset 3 days ago.  Sharp and intermittent.  Feels she is having sickle cell pain crisis she says.  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  Pain progressively worsening despite home meds prompting ED visit.     Hospital Course:  Present on Admission: . Sickle cell pain crisis ([place])  Is a patient with hemoglobin SS who was admitted with sickle cell crisis.  The patient was treated with Dilaudid via the PCA, Toradol and IV fluids.  Please note that this patient has been weaning herself off of chronic medications and only using her MS  Contin during the time of her crises.  States that in addition to oxycodone on a as needed basis this is been affected and minimizing her need for long-term opioids.  During this hospitalization MS Contin was instituted for treatment of her pain and at the time of discharge her pain was down to 4 out of 10.  She was discharged on a tapering dose of MS Contin to avoid symptoms of withdrawal.  The patient has an anemia of chronic disease and did have some decrease in her hemoglobin which was attributed to dilutional effect of IV fluids.  She also had a concurrent decrease in her reticulocytosis and the Hydrea was adjusted to alternating between 1000 and 5000 mg on an every other day basis.  At the time of discharge reticulocytosis had improved and her hemoglobin has also started to improve.  She is to  follow-up with her primary prescriber to reevaluate her hemoglobin and reticulocyte counts to make determination about resuming her previous dose of hydroxyurea.  Patient also is on chronic anticoagulation with Xarelto and was continued on the Xarelto without interruption during his hospitalization.  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  This is felt to be reactive to her chronic inflammatory process.  The patient did have a mild leukocytosis during hospitalization however this was attributed to the inflammation associated with sickle cell crisis and she had no evidence of infection.  From a psychosocial standpoint the patient experienced significant stress due to issues regarding child care for her 2 young children.  However with much effort childcare was successfully arranged for the patient which alleviated the stress and aided in hasten recovery from her sickle cell crisis.   Disposition and Follow-up: Patient is discharged home in good condition and is to follow-up with her primary care provider on an appointment that has already been scheduled. Discharge  Instructions    AMB Referral to [place] Management   Complete by:  As directed    Please assign to [place] for transition of care. Has multiple hosptializations and ED visits. [place] has been attempting to reach prior to admission. Active [facility] Care Management consent remains on file. Currently at [facility]. Please call with questions. Thanks. Marthenia Rolling, [place], [facility] CBULAGT-[phone]   Reason for consult:  Please assign to [facility] RNCM   Expected date of contact:  1-3 days (reserved for hospital discharges)   Activity as tolerated - No restrictions   Complete by:  As directed    Diet general   Complete by:  As directed       DISCHARGE EXAM:  General: Alert, awake, oriented x 3 she is well-appearing and in no acute distress currently.  HEENT: [place]/AT PEERL, EOMI, anicteric Neck: Trachea midline, no masses, no thyromegal,y no JVD, no carotid bruit OROPHARYNX: Moist, No exudate/ erythema/lesions.  Heart: Regular rate and rhythm, without murmurs, rubs, gallops or S3. PMI non-displaced. Exam reveals no decreased pulses. Pulmonary/Chest: Normal effort. Breath sounds normal. No. Apnea. Clear to auscultation,no stridor,  no wheezing and no rhonchi noted. No respiratory distress and no tenderness noted. Abdomen: Soft, nontender, nondistended, normal bowel sounds, no masses no hepatosplenomegaly noted. No fluid wave and no ascites. There is no guarding or rebound. Neuro: Alert and oriented to person, place and time. Normal motor skills, Displays no atrophy or tremors and exhibits normal muscle tone.  No focal neurological deficits noted cranial nerves II through XII grossly intact. No sensory deficit noted. Strength at baseline in bilateral upper and lower extremities. Gait normal. Musculoskeletal: No warmth swelling or erythema around joints, no spinal tenderness noted. Psychiatric: Patient alert and oriented x3, good insight and  cognition, good recent to remote recall. Lymph node survey: No cervical axillary or inguinal lymphadenopathy noted. Skin: Skin is warm and dry. No bruising, no ecchymosis and no rash noted. Pt is not diaphoretic. No erythema. No pallor    Blood pressure 114/78, pulse 83, temperature 100.3 F (37.9 C), temperature source Oral, resp. rate 11, height 5\' 1"  (1.549 m), weight 97 kg (213 lb 13.5 oz), SpO2 97 %, not currently breastfeeding.  Recent Labs    05/30/17 0527 06/01/17 1130  NA 138 139  K 3.7 3.7  CL 106 108  CO2 26 27  GLUCOSE 116* 98  BUN 7 8  CREATININE 0.60 0.59  CALCIUM 8.5* 8.4*   No results for input(s): AST, ALT, ALKPHOS, BILITOT, PROT, ALBUMIN in the last 72 hours. No results for input(s): LIPASE, AMYLASE in the last 72 hours. Recent Labs    05/30/17 0527 06/01/17 1130  WBC 23.3* 14.1*  NEUTROABS 19.4* 9.8*  HGB 6.6* 6.1*  HCT 18.7* 17.3*  MCV 83.5 86.5  PLT 525* 464*     Total time spent including face to face and decision making was greater than 30 minutes  Signed: Kamden Stanislaw A. 06/01/2017, 4:04 PM

## 2017-06-01 NOTE — Progress Notes (Signed)
Nursing Discharge Summary  Patient ID: [name] MRN: [id number] DOB/AGE: 08/22/1990 26 y.o.  Admit date: 05/27/2017 Discharge date: 06/01/2017  Discharged Condition: good  Disposition: 01-Home or Self Care  Follow-up Information    Scot Jun, FNP. Schedule an appointment as soon as possible for a visit in 1 day(s).   Specialty:  Family Medicine Why:  Reschedule cancelled appointment.  Contact information: [place] [phone]           Prescriptions Given: No new prescriptions given.  Reviewed patient discharge instructions and medications.  Patient verbalized understanding without further questions.   Means of Discharge: Patient to be taken downstairs via wheelchair to be discharged home via private vehicle.   Signed: Buel Ream 06/01/2017, 4:36 PM

## 2017-06-01 NOTE — Progress Notes (Signed)
PHARMACIST - PHYSICIAN COMMUNICATION  DR:  MATTHEWS CONCERNING: IV to Oral Route Change Policy  RECOMMENDATION: This patient is receiving diphenhydramine by the intravenous route.  Based on criteria approved by the Pharmacy and Therapeutics Committee, intravenous diphenhydramine is being converted to the equivalent oral dose form(s). Note, patient ordered both IV and PO diphenhydramine. IV formulation will be discontinued based on criteria below.   DESCRIPTION: These criteria include:  Diphenhydramine is not prescribed to treat or prevent a severe allergic reaction  Diphenhydramine is not prescribed as premedication prior to receiving blood product, biologic medication, antimicrobial, or chemotherapy agent  The patient has tolerated at least one dose of an oral or enteral medication  The patient has no evidence of active gastrointestinal bleeding or impaired GI absorption (gastrectomy, short bowel, patient on TNA or NPO).  The patient is not undergoing procedural sedation   If you have questions about this conversion, please contact the Pharmacy Department  []   [phone] )  [facility] []   [phone] )  [facility] []   [phone] )  [facility] []   [phone] )  [facility] [x]   [phone] )  [facility]     Lindell Spar, PharmD, [place] Pager: [phone] 06/01/2017 9:00 AM

## 2017-06-02 ENCOUNTER — Other Ambulatory Visit: Payer: Self-pay | Admitting: *Deleted

## 2017-06-02 NOTE — Patient Outreach (Signed)
[place] [facility]) Care Management  06/02/2017  EMMERSEN GARRAWAY 10-19-1990 [id number]   Referral received from hospital liaison as member has had frequent hospitalizations (6) and ED visits (9) in the last 6 months related to sickle cell crisis.  Member has had multiple referrals to [facility] since 2017, however has not engaged.  Call placed to member at number verified by hospital liaison as primary number [phone]) to start transition of care, unsuccessful.  Unable to leave a voice message as mailbox is not set up.  Will make second attempt next week.  Valente David, [place], MSN [place] [phone]

## 2017-06-03 LAB — CULTURE, BLOOD (ROUTINE X 2)
Culture: NO GROWTH
Culture: NO GROWTH
Special Requests: ADEQUATE
Special Requests: ADEQUATE

## 2017-06-05 ENCOUNTER — Other Ambulatory Visit: Payer: Self-pay

## 2017-06-05 NOTE — Patient Outreach (Signed)
[place] [facility]) Care Management  06/05/2017  TAMANTHA SALINE 1991/01/22 [id number]    Assessment: 27 year old with recent admission related to sickle cell. Client has had frequent hospitalizations (6) and ED visits (9) in the last 6 months related to sickle cell crisis. Member has had multiple referrals to [facility] since 2017, however has not engaged.  RNCM called to complete transition of care. No answer.   Plan: RNCM will update assigned care coordinator; continue to attempt to reach client.  Thea Silversmith, RN, MSN, [place] Coordinator Cell: [phone]

## 2017-06-05 NOTE — Patient Outreach (Signed)
[place]) Care Management  06/05/2017  JUDA LAJEUNESSE 1990/09/02 [id number]   Assessment: 27 year old with recent admission related to sickle cell. Client has had frequent hospitalizations (6) and ED visits (9) in the last 6 months related to sickle cell crisis.  Member has had multiple referrals to [facility] since 2017, however has not engaged.  RNCM called regarding care management services and complete transition of care. Two patient identifiers verified. Client states this is not a good time to talk and request RNCM call back in about 30 minutes.  Plan: continue to follow and outreach to client within the hour.   Thea Silversmith, RN, MSN, [place] Coordinator Cell: [phone]

## 2017-06-07 ENCOUNTER — Encounter: Payer: Self-pay | Admitting: *Deleted

## 2017-06-07 ENCOUNTER — Other Ambulatory Visit: Payer: Self-pay | Admitting: *Deleted

## 2017-06-07 NOTE — Patient Outreach (Signed)
[place] [facility]) Care Management  06/07/2017  TEKEISHA HAKIM January 11, 1991 [id number]   Call placed to member to initiate transition of care, unsuccessful.  This makes 3rd unsuccessful attempt since discharge.  Unable to leave a message as voice mail has not been set up.  Will send unsuccessful outreach letter, if no response in 10 days will close case.  Valente David, [place], MSN [place] [phone]

## 2017-06-09 IMAGING — DX DG KNEE 1-2V PORT*R*
2 series · 2 of 2 positions shown · non-contrast
Comparison: Radiograph dated 05/28/2013

CLINICAL DATA: 25-year-old female with right knee pain and
swelling.

EXAM:
PORTABLE RIGHT KNEE - 1-2 VIEW

[knee ap]
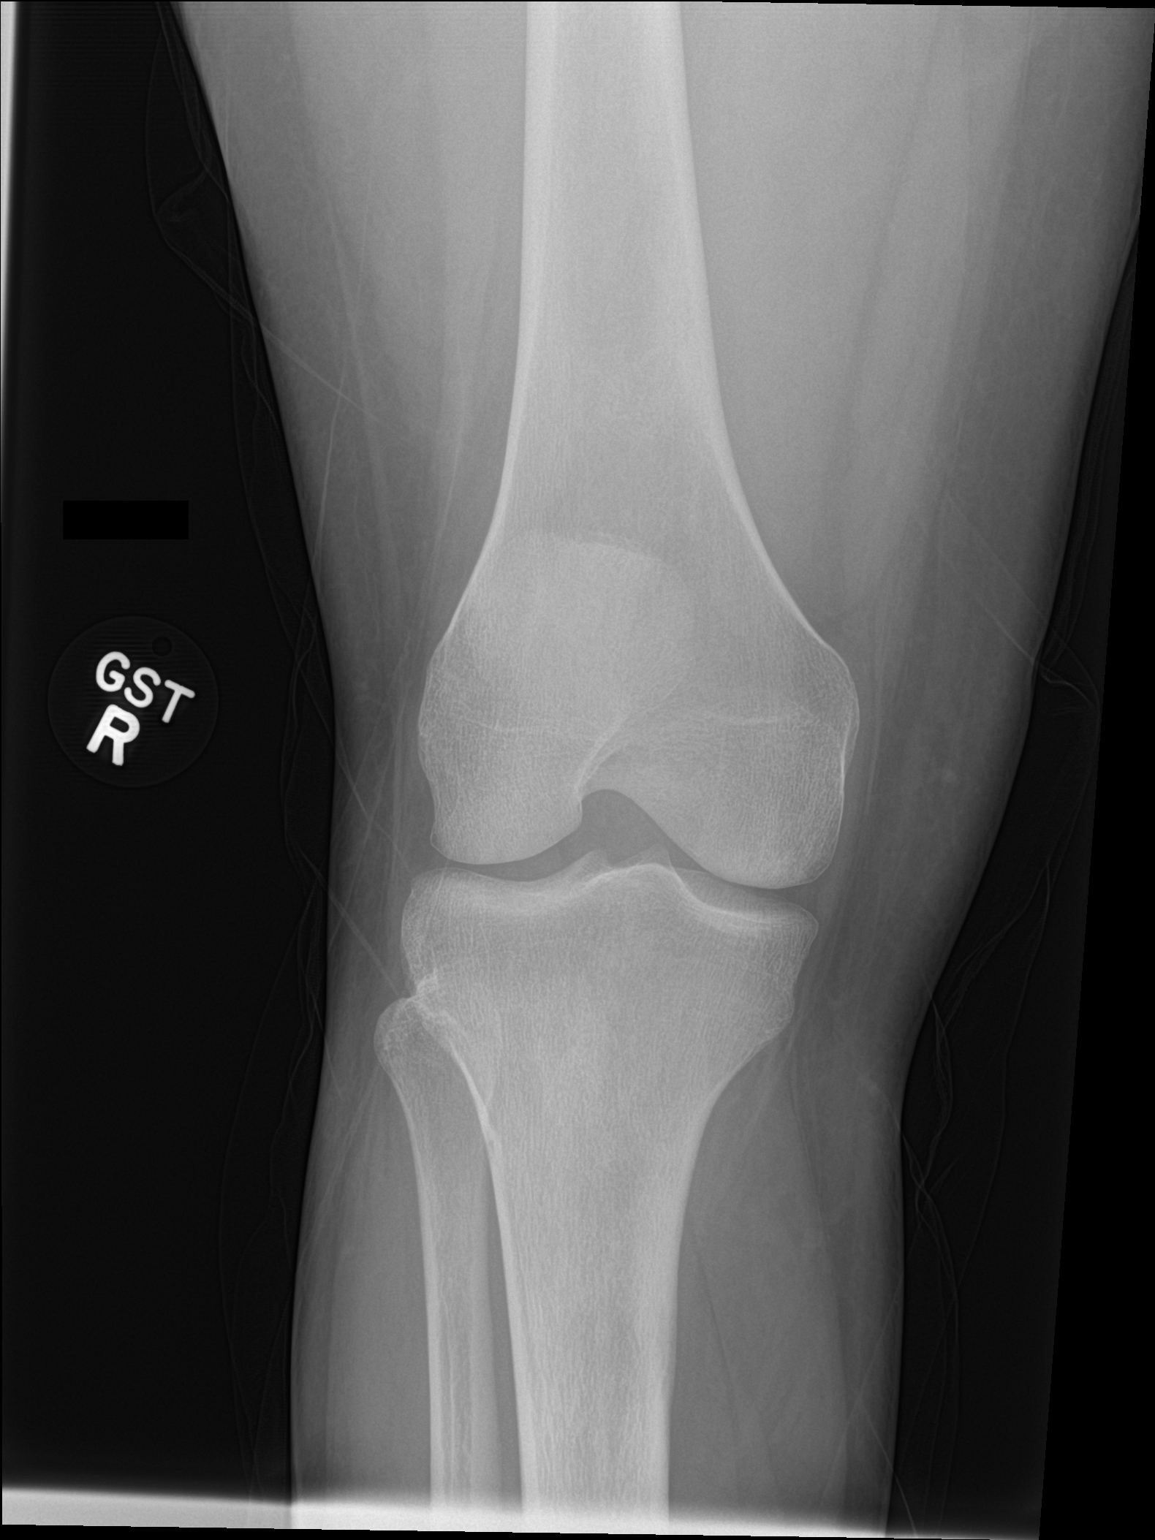

[knee lat]
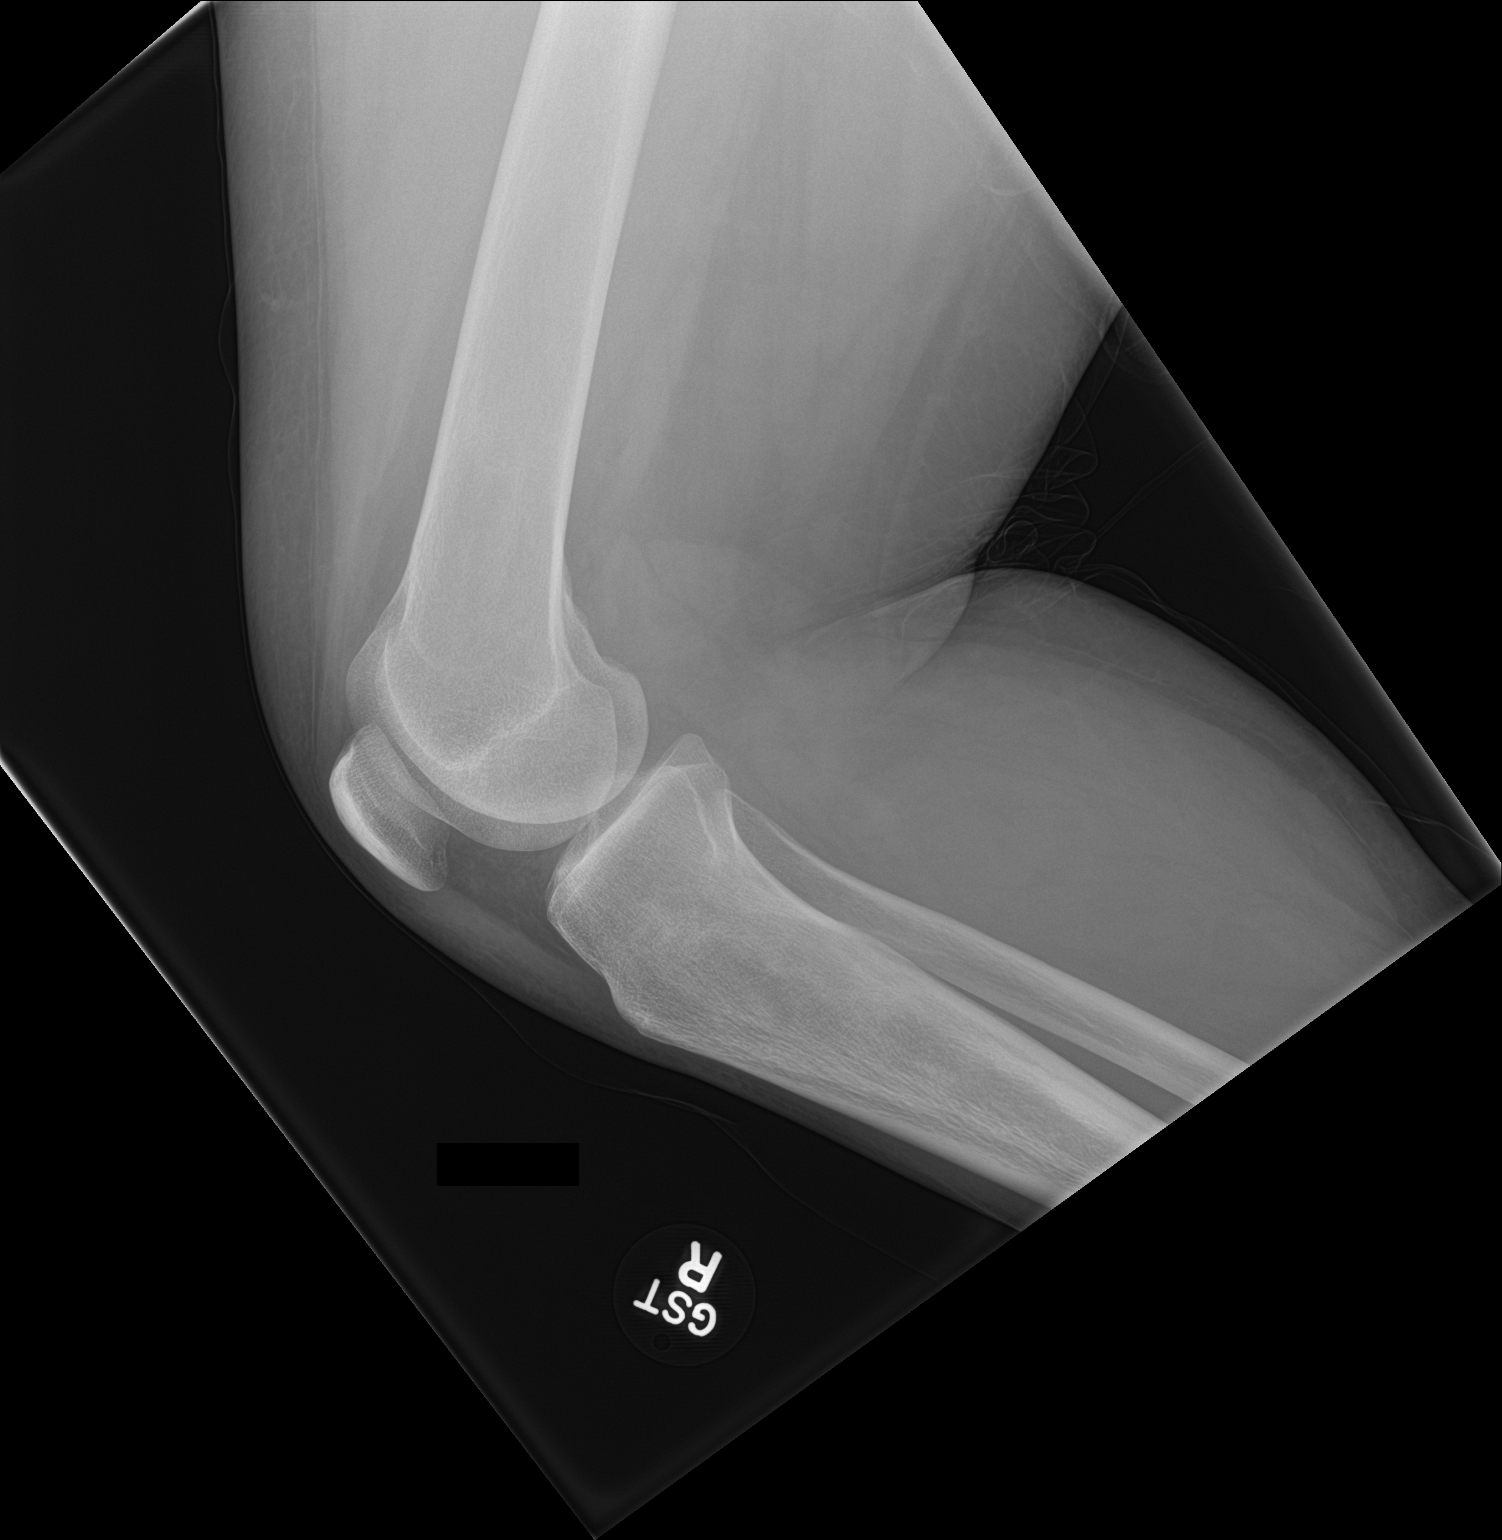

[2 of 2 positions shown; findings below may reference images not displayed]

FINDINGS: There is no acute fracture or dislocation. The bones are well
mineralized. Areas of heterogeneity in the proximal tibia again
noted as seen on the prior exam. No arthritic changes. No joint
effusion. The soft tissues appear unremarkable.
IMPRESSION: No acute osseous pathology.  No interval change.

## 2017-06-11 ENCOUNTER — Encounter (HOSPITAL_BASED_OUTPATIENT_CLINIC_OR_DEPARTMENT_OTHER): Payer: Self-pay | Admitting: Adult Health

## 2017-06-11 ENCOUNTER — Emergency Department (HOSPITAL_BASED_OUTPATIENT_CLINIC_OR_DEPARTMENT_OTHER)
Admission: EM | Admit: 2017-06-11 | Discharge: 2017-06-12 | Disposition: A | Discharge status: Home / Self Care | Payer: Medicare Other | Attending: Emergency Medicine | Admitting: Emergency Medicine

## 2017-06-11 ENCOUNTER — Other Ambulatory Visit: Payer: Self-pay

## 2017-06-11 DIAGNOSIS — R1012 Left upper quadrant pain: Secondary | ICD-10-CM | POA: Diagnosis not present

## 2017-06-11 DIAGNOSIS — D5700 Hb-SS disease with crisis, unspecified: Secondary | ICD-10-CM | POA: Diagnosis not present

## 2017-06-11 DIAGNOSIS — R1032 Left lower quadrant pain: Secondary | ICD-10-CM | POA: Diagnosis not present

## 2017-06-11 DIAGNOSIS — R112 Nausea with vomiting, unspecified: Secondary | ICD-10-CM | POA: Diagnosis present

## 2017-06-11 LAB — PREGNANCY, URINE: Preg Test, Ur: NEGATIVE

## 2017-06-11 LAB — URINALYSIS, ROUTINE W REFLEX MICROSCOPIC
BILIRUBIN URINE: NEGATIVE
Glucose, UA: NEGATIVE mg/dL
Hgb urine dipstick: NEGATIVE
KETONES UR: NEGATIVE mg/dL
LEUKOCYTES UA: NEGATIVE
NITRITE: NEGATIVE
PROTEIN: NEGATIVE mg/dL
Specific Gravity, Urine: 1.01 (ref 1.005–1.030)
pH: 8 (ref 5.0–8.0)

## 2017-06-11 LAB — COMPREHENSIVE METABOLIC PANEL
ALK PHOS: 67 U/L (ref 38–126)
ALT: 20 U/L (ref 14–54)
AST: 16 U/L (ref 15–41)
Albumin: 4.1 g/dL (ref 3.5–5.0)
Anion gap: 9 (ref 5–15)
BILIRUBIN TOTAL: 1.3 mg/dL — AB (ref 0.3–1.2)
BUN: 9 mg/dL (ref 6–20)
CALCIUM: 9.1 mg/dL (ref 8.9–10.3)
CO2: 24 mmol/L (ref 22–32)
CREATININE: 0.92 mg/dL (ref 0.44–1.00)
Chloride: 105 mmol/L (ref 101–111)
GFR calc Af Amer: >60 mL/min (ref >60)
Glucose, Bld: 95 mg/dL (ref 65–99)
POTASSIUM: 4 mmol/L (ref 3.5–5.1)
Sodium: 138 mmol/L (ref 135–145)
TOTAL PROTEIN: 7.7 g/dL (ref 6.5–8.1)

## 2017-06-11 LAB — LIPASE, BLOOD: Lipase: 24 U/L (ref 11–51)

## 2017-06-11 LAB — CBC
HCT: 25.2 % — ABNORMAL LOW (ref 36.0–46.0)
Hemoglobin: 8.4 g/dL — ABNORMAL LOW (ref 12.0–15.0)
MCH: 28.8 pg (ref 26.0–34.0)
MCHC: 33.3 g/dL (ref 30.0–36.0)
MCV: 86.3 fL (ref 78.0–100.0)
PLATELETS: 617 10*3/uL — AB (ref 150–400)
RBC: 2.92 MIL/uL — ABNORMAL LOW (ref 3.87–5.11)
RDW: 19.1 % — AB (ref 11.5–15.5)
WBC: 14.9 10*3/uL — AB (ref 4.0–10.5)

## 2017-06-11 MED ORDER — HYDROMORPHONE HCL 1 MG/ML IJ SOLN
2.0000 mg | INTRAMUSCULAR | Status: AC
  Administered 2017-06-11: 2 mg via INTRAVENOUS

## 2017-06-11 MED ORDER — SODIUM CHLORIDE 0.45 % IV SOLN
INTRAVENOUS | Status: DC
  Administered 2017-06-11: 23:00:00 via INTRAVENOUS

## 2017-06-11 MED ORDER — KETOROLAC TROMETHAMINE 15 MG/ML IJ SOLN
15.0000 mg | INTRAMUSCULAR | Status: AC
  Administered 2017-06-11: 15 mg via INTRAVENOUS
  Filled 2017-06-11: qty 1

## 2017-06-11 MED ORDER — HYDROMORPHONE HCL 1 MG/ML IJ SOLN: 2.0000 mg | INTRAMUSCULAR | Status: AC

## 2017-06-11 MED ORDER — ONDANSETRON HCL 4 MG/2ML IJ SOLN
4.0000 mg | INTRAMUSCULAR | Status: DC | PRN
  Administered 2017-06-11: 4 mg via INTRAVENOUS
  Filled 2017-06-11: qty 2

## 2017-06-11 MED ORDER — HYDROMORPHONE HCL 1 MG/ML IJ SOLN
2.0000 mg | INTRAMUSCULAR | Status: AC
  Filled 2017-06-11 (×2): qty 2

## 2017-06-11 MED ORDER — ONDANSETRON 4 MG PO TBDP
4.0000 mg | ORAL_TABLET | Freq: Once | ORAL | Status: AC | PRN
  Administered 2017-06-11: 4 mg via ORAL
  Filled 2017-06-11: qty 1

## 2017-06-11 MED ORDER — HYDROMORPHONE HCL 1 MG/ML IJ SOLN
2.0000 mg | INTRAMUSCULAR | Status: AC
  Administered 2017-06-12 (×2): 2 mg via INTRAVENOUS
  Filled 2017-06-11: qty 2

## 2017-06-11 MED ORDER — DIPHENHYDRAMINE HCL 50 MG/ML IJ SOLN
25.0000 mg | Freq: Once | INTRAMUSCULAR | Status: AC
  Administered 2017-06-11: 25 mg via INTRAVENOUS
  Filled 2017-06-11: qty 1

## 2017-06-11 NOTE — ED Triage Notes (Signed)
Presents with nausea and vomiting and sharp pain in left lower side of stomach, she says at first it did not feel like sickle cell pain, but now it does. Unable to hold down fluids.

## 2017-06-11 NOTE — ED Notes (Signed)
Pt requesting port access for meds. Explained there would be a wait to have it accessed but could attempt piv placement or give meds IM. Pt declined. States she has high needle anxiety and would wait for port access. Pt in NAD. Calm and pleasant. Comfort measures offered.

## 2017-06-11 NOTE — ED Notes (Signed)
Pt given snack after verbal approval from Dr. Gilford Raid

## 2017-06-11 NOTE — ED Provider Notes (Signed)
[facility] EMERGENCY DEPARTMENT Provider Note   CSN: [id number] Arrival date & time: 06/11/17  1850     History   Chief Complaint Chief Complaint  Patient presents with  . Abdominal Pain    Sickle cell    HPI [name] is a 27 y.o. female.  Pt presents to the ED today with abdominal pain c/w sickle cell pain crisis.  Pt has a hx of sickle cell disease with frequent ED visits and admissions.  She was admitted from 1/12-17 for same.  Pt has been taking her meds as directed, but they have not been helping.  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      Past Medical History:  Diagnosis Date  . Blood transfusion    "lots"  . Blood transfusion without reported diagnosis   . Chronic back pain    "very severe; have knot in my back; from tight muscle; take RX and exercise for it"  . Depression 01/06/2011  . Exertional dyspnea    "sometimes"  . Genital HSV   . GERD (gastroesophageal reflux disease) 02/17/2011  . Migraines 11/08/11   "@ least twice/month"  . 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  . Mood swings 11/08/11   "I go back and forth; real bad"  . Sickle cell anemia ([name])   . Sickle cell anemia with crisis ([place])   . Thrombocytosis ([place]) 11/09/2015  . Trichotillomania    h/o    Patient Active Problem List   Diagnosis Date Noted  . Menorrhagia   . Sickle cell pain crisis ([place]) 05/04/2017  . Leukocytosis   . Chronic anticoagulation   . Sickle cell anemia ([place]) 05/19/2016  . History of DVT (deep vein thrombosis) 04/17/2016  . Thrombocytosis ([place]) 11/09/2015  . Hyperbilirubinemia 11/09/2015  . Chronic pain 08/04/2015  . Herpes simplex 07/14/2015  . Anemia of chronic disease   . Sickle cell crisis ([name]) 08/09/2014  . 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 CHOLECYSTECTOMY  05/2010  . DILATION AND  CURETTAGE OF UTERUS  02/20/11   S/P miscarriage  . IR GENERIC HISTORICAL  12/23/2015   IR FLUORO GUIDE CV LINE RIGHT 12/23/2015 Jacqulynn Cadet, MD [facility]  . IR GENERIC HISTORICAL  12/23/2015   IR US GUIDE VASC ACCESS RIGHT 12/23/2015 Jacqulynn Cadet, MD [facility]    OB History    Gravida Para Term Preterm AB Living   5 2 2  0 3 2   SAB TAB Ectopic Multiple Live Births   3 0 0 0 2      Obstetric Comments   Miscarried in October 2012 at about 7 weeks       Home Medications    Prior to Admission medications   Medication Sig Start Date End Date Taking? Authorizing Provider  aspirin-acetaminophen-caffeine (EXCEDRIN MIGRAINE) [phone] MG tablet Take 2 tablets every 6 (six) hours as needed by mouth for headache.    [provider]  Cyanocobalamin-Caffeine (ENERGY CHEWS PO) Take 2 tablets daily by mouth.    [provider]  hydroxyurea (HYDREA) 500 MG capsule Take 2 tabs (1000 mg ) PO QOD starting on 06/02/2017 alternating with 1 tab (500 mg) PO QOD starting on 06/03/2017. May take with food to minimize GI side effects. 06/01/17   [name], MD  medroxyPROGESTERone (DEPO-PROVERA) 150 MG/ML injection Inject 1  mL (150 mg total) into the muscle every 3 (three) months. 09/21/16   Shelly Bombard, MD  morphine (MS CONTIN) 60 MG 12 hr tablet MS Contin 60 mg PO BID x 2 days then MS Contin 60 mg po q HS x 2 days then resume previous schedule of MS Contin 60 mg PO BID PRN crisis. 05/29/17   [name], MD  Multiple Vitamin (MULTIVITAMIN WITH MINERALS) TABS tablet Take 1 tablet daily by mouth.    [provider]  Oxycodone HCl 20 MG TABS Take 20 mg every 4 (four) hours as needed by mouth (pain).  01/17/17   [provider]  rivaroxaban (XARELTO) 20 MG TABS tablet Take 1 tablet (20 mg total) by mouth daily with supper. 04/27/17   Scot Jun, FNP    Family History Family History  Problem Relation Age of Onset  . Sickle cell trait  Mother   . Sickle cell trait Father   . Diabetes Maternal Grandmother   . Diabetes Paternal Grandmother   . Hypertension Paternal Grandmother   . Diabetes Maternal Grandfather     Social History Social History   Tobacco Use  . Smoking status: Former Smoker    Packs/day: 0.25    Years: 1.00    Pack years: 0.25    Types: Cigarettes    Last attempt to quit: 03/25/2013    Years since quitting: 4.2  . Smokeless tobacco: Never Used  Substance Use Topics  . Alcohol use: No    Comment: denies  . Drug use: No    Comment: "Clean for 3 months"     Allergies   Food and Latex   Review of Systems Review of Systems  Gastrointestinal: Positive for abdominal pain.  Musculoskeletal:       All over bone pain  All other systems reviewed and are negative.    Physical Exam Updated Vital Signs BP 120/62 (BP Location: Right Arm)   Pulse 82   Temp 98.2 F (36.8 C) (Oral)   Resp 18   Ht 5\' 1"  (1.549 m)   Wt 91.6 kg (202 lb)   LMP 05/16/2017 (Approximate)   SpO2 96%   BMI 38.17 kg/m   Physical Exam  Constitutional: She is oriented to person, place, and time. She appears well-developed and well-nourished.  HENT:  Head: Normocephalic and atraumatic.  Mouth/Throat: Oropharynx is clear and moist.  Eyes: EOM are normal. Pupils are equal, round, and reactive to light.  Cardiovascular: Normal rate, regular rhythm, normal heart sounds and intact distal pulses.  Pulmonary/Chest: Effort normal and breath sounds normal.  Abdominal: Normal appearance and bowel sounds are normal. There is tenderness in the left upper quadrant.  Neurological: She is alert and oriented to person, place, and time.  Skin: Skin is warm and dry. Capillary refill takes less than 2 seconds.  Psychiatric: She has a normal mood and affect. Her behavior is normal.  Nursing note and vitals reviewed.    ED Treatments / Results  Labs (all labs ordered are listed, but only abnormal results are displayed) Labs  Reviewed  COMPREHENSIVE METABOLIC PANEL - Abnormal; Notable for the following components:      Result Value   Total Bilirubin 1.3 (*)    All other components within normal limits  CBC - Abnormal; Notable for the following components:   WBC 14.9 (*)    RBC 2.92 (*)    Hemoglobin 8.4 (*)    HCT 25.2 (*)    RDW 19.1 (*)  Platelets 617 (*)    All other components within normal limits  RETICULOCYTES - Abnormal; Notable for the following components:   Retic Ct Pct 16.3 (*)    RBC. 2.99 (*)    Retic Count, Absolute 487.4 (*)    All other components within normal limits  LIPASE, BLOOD  URINALYSIS, ROUTINE W REFLEX MICROSCOPIC  PREGNANCY, URINE    EKG  EKG Interpretation None       Radiology No results found.  Procedures Procedures (including critical care time)  Medications Ordered in ED Medications  HYDROmorphone (DILAUDID) injection 2 mg (not administered)    Or  HYDROmorphone (DILAUDID) injection 2 mg (not administered)  HYDROmorphone (DILAUDID) injection 2 mg (not administered)    Or  HYDROmorphone (DILAUDID) injection 2 mg (not administered)  ondansetron (ZOFRAN-ODT) disintegrating tablet 4 mg (4 mg Oral Given 06/11/17 1929)  ketorolac (TORADOL) 15 MG/ML injection 15 mg (15 mg Intravenous Given 06/11/17 2259)  HYDROmorphone (DILAUDID) injection 2 mg (2 mg Intravenous Given 06/11/17 2311)    Or  HYDROmorphone (DILAUDID) injection 2 mg ( Subcutaneous See Alternative 06/11/17 2311)  HYDROmorphone (DILAUDID) injection 2 mg (2 mg Intravenous Given 06/12/17 0108)    Or  HYDROmorphone (DILAUDID) injection 2 mg ( Subcutaneous See Alternative 06/12/17 0108)  diphenhydrAMINE (BENADRYL) injection 25 mg (25 mg Intravenous Given 06/11/17 2302)  diphenhydrAMINE (BENADRYL) capsule 25 mg (25 mg Oral Given 06/12/17 0051)  oxyCODONE-acetaminophen (PERCOCET/ROXICET) 5-325 MG per tablet 2 tablet (2 tablets Oral Given 06/12/17 0318)  heparin lock flush 100 unit/mL (500 Units Intracatheter  Given 06/12/17 0327)     Initial Impression / Assessment and Plan / ED Course  I have reviewed the triage vital signs and the nursing notes.  Pertinent labs & imaging results that were available during my care of the patient were reviewed by me and considered in my medical decision making (see chart for details).    Pt signed out to Dr. Dina Rich pending labs.  Pt is feeling much better now.   Final Clinical Impressions(s) / ED Diagnoses   Final diagnoses:  Sickle cell crisis [facility])    ED Discharge Orders    None       Isla Pence, MD 06/18/17 [phone]

## 2017-06-12 ENCOUNTER — Ambulatory Visit: Payer: Self-pay | Admitting: Family Medicine

## 2017-06-12 DIAGNOSIS — D5700 Hb-SS disease with crisis, unspecified: Secondary | ICD-10-CM | POA: Diagnosis not present

## 2017-06-12 LAB — RETICULOCYTES
RBC.: 2.99 MIL/uL — AB (ref 3.87–5.11)
RETIC COUNT ABSOLUTE: 487.4 10*3/uL — AB (ref 19.0–186.0)
RETIC CT PCT: 16.3 % — AB (ref 0.4–3.1)

## 2017-06-12 MED ORDER — DIPHENHYDRAMINE HCL 25 MG PO CAPS
25.0000 mg | ORAL_CAPSULE | Freq: Once | ORAL | Status: AC
  Administered 2017-06-12: 25 mg via ORAL

## 2017-06-12 MED ORDER — OXYCODONE-ACETAMINOPHEN 5-325 MG PO TABS
2.0000 | ORAL_TABLET | Freq: Once | ORAL | Status: AC
  Administered 2017-06-12: 2 via ORAL
  Filled 2017-06-12: qty 2

## 2017-06-12 MED ORDER — DIPHENHYDRAMINE HCL 25 MG PO CAPS
ORAL_CAPSULE | ORAL | Status: AC
  Filled 2017-06-12: qty 1

## 2017-06-12 MED ORDER — HEPARIN SOD (PORK) LOCK FLUSH 100 UNIT/ML IV SOLN
500.0000 [IU] | INTRAVENOUS | Status: AC | PRN
  Administered 2017-06-12: 500 [IU]

## 2017-06-12 MED ORDER — HEPARIN SOD (PORK) LOCK FLUSH 100 UNIT/ML IV SOLN
INTRAVENOUS | Status: AC
  Filled 2017-06-12: qty 5

## 2017-06-12 NOTE — ED Notes (Signed)
Pt given d/c instructions as per chart. Verbalizes understanding. No questions. 

## 2017-06-12 NOTE — ED Notes (Signed)
Portacath right anterior chest deaccessed per protocol.

## 2017-06-12 NOTE — ED Provider Notes (Signed)
2:55 AM  Patient much improved after being on protocol.  She is requesting a fourth dose of IV pain medication; she was dosed oral pain meds as her pain has significantly improved.  She feels that she can be discharged home.  Lab work is at baseline.  After history, exam, and medical workup I feel the patient has been appropriately medically screened and is safe for discharge home. Pertinent diagnoses were discussed with the patient. Patient was given return precautions.    Merryl Hacker, MD 06/12/17 [phone]

## 2017-06-12 NOTE — ED Notes (Signed)
Resting quietly. Eyes closed. Rise and fall of chest noted.

## 2017-06-21 ENCOUNTER — Encounter: Payer: Self-pay | Admitting: *Deleted

## 2017-06-21 ENCOUNTER — Other Ambulatory Visit: Payer: Self-pay | Admitting: *Deleted

## 2017-06-21 NOTE — Patient Outreach (Signed)
[facility]) Care Management  06/21/2017  [name] Jun 15, 1990 [id number]   Outreach letter sent on 1/23, no response.  Will notify care management assistant, primary MD, and member of case closure.  Valente David, [place], MSN [place] [phone]

## 2017-07-01 ENCOUNTER — Inpatient Hospital Stay (HOSPITAL_COMMUNITY)
Admission: EM | Admit: 2017-07-01 | Discharge: 2017-07-07 | DRG: 812 | Disposition: A | Discharge status: Home / Self Care | Payer: Medicare Other | Attending: Internal Medicine | Admitting: Internal Medicine

## 2017-07-01 DIAGNOSIS — R160 Hepatomegaly, not elsewhere classified: Secondary | ICD-10-CM | POA: Diagnosis present

## 2017-07-01 DIAGNOSIS — M549 Dorsalgia, unspecified: Secondary | ICD-10-CM | POA: Diagnosis present

## 2017-07-01 DIAGNOSIS — K219 Gastro-esophageal reflux disease without esophagitis: Secondary | ICD-10-CM | POA: Diagnosis present

## 2017-07-01 DIAGNOSIS — Z79891 Long term (current) use of opiate analgesic: Secondary | ICD-10-CM | POA: Diagnosis not present

## 2017-07-01 DIAGNOSIS — Z833 Family history of diabetes mellitus: Secondary | ICD-10-CM

## 2017-07-01 DIAGNOSIS — D5700 Hb-SS disease with crisis, unspecified: Principal | ICD-10-CM | POA: Diagnosis present

## 2017-07-01 DIAGNOSIS — Z7901 Long term (current) use of anticoagulants: Secondary | ICD-10-CM | POA: Diagnosis not present

## 2017-07-01 DIAGNOSIS — Z87891 Personal history of nicotine dependence: Secondary | ICD-10-CM | POA: Diagnosis not present

## 2017-07-01 DIAGNOSIS — R208 Other disturbances of skin sensation: Secondary | ICD-10-CM | POA: Diagnosis present

## 2017-07-01 DIAGNOSIS — R0902 Hypoxemia: Secondary | ICD-10-CM | POA: Diagnosis not present

## 2017-07-01 DIAGNOSIS — D72829 Elevated white blood cell count, unspecified: Secondary | ICD-10-CM | POA: Diagnosis not present

## 2017-07-01 DIAGNOSIS — F329 Major depressive disorder, single episode, unspecified: Secondary | ICD-10-CM | POA: Diagnosis present

## 2017-07-01 DIAGNOSIS — Z8249 Family history of ischemic heart disease and other diseases of the circulatory system: Secondary | ICD-10-CM

## 2017-07-01 DIAGNOSIS — Z91018 Allergy to other foods: Secondary | ICD-10-CM

## 2017-07-01 DIAGNOSIS — Z79899 Other long term (current) drug therapy: Secondary | ICD-10-CM | POA: Diagnosis not present

## 2017-07-01 DIAGNOSIS — R509 Fever, unspecified: Secondary | ICD-10-CM

## 2017-07-01 DIAGNOSIS — Z86718 Personal history of other venous thrombosis and embolism: Secondary | ICD-10-CM

## 2017-07-01 DIAGNOSIS — Z832 Family history of diseases of the blood and blood-forming organs and certain disorders involving the immune mechanism: Secondary | ICD-10-CM | POA: Diagnosis not present

## 2017-07-01 DIAGNOSIS — K5900 Constipation, unspecified: Secondary | ICD-10-CM | POA: Diagnosis not present

## 2017-07-01 DIAGNOSIS — D638 Anemia in other chronic diseases classified elsewhere: Secondary | ICD-10-CM | POA: Diagnosis present

## 2017-07-01 DIAGNOSIS — Z9049 Acquired absence of other specified parts of digestive tract: Secondary | ICD-10-CM | POA: Diagnosis not present

## 2017-07-01 DIAGNOSIS — R000 Tachycardia, unspecified: Secondary | ICD-10-CM | POA: Diagnosis not present

## 2017-07-01 DIAGNOSIS — Z91040 Latex allergy status: Secondary | ICD-10-CM

## 2017-07-01 DIAGNOSIS — D57219 Sickle-cell/Hb-C disease with crisis, unspecified: Secondary | ICD-10-CM | POA: Diagnosis not present

## 2017-07-01 DIAGNOSIS — G894 Chronic pain syndrome: Secondary | ICD-10-CM | POA: Diagnosis present

## 2017-07-01 DIAGNOSIS — R935 Abnormal findings on diagnostic imaging of other abdominal regions, including retroperitoneum: Secondary | ICD-10-CM

## 2017-07-01 DIAGNOSIS — J9811 Atelectasis: Secondary | ICD-10-CM | POA: Diagnosis not present

## 2017-07-01 NOTE — ED Notes (Signed)
Bed: WA21 Expected date:  Expected time:  Means of arrival:  Comments: ems 

## 2017-07-01 NOTE — ED Notes (Signed)
Bed: WTR6 Expected date:  Expected time:  Means of arrival:  Comments: 

## 2017-07-02 ENCOUNTER — Other Ambulatory Visit: Payer: Self-pay

## 2017-07-02 ENCOUNTER — Encounter (HOSPITAL_COMMUNITY): Payer: Self-pay | Admitting: *Deleted

## 2017-07-02 DIAGNOSIS — D5700 Hb-SS disease with crisis, unspecified: Secondary | ICD-10-CM | POA: Diagnosis not present

## 2017-07-02 DIAGNOSIS — R160 Hepatomegaly, not elsewhere classified: Secondary | ICD-10-CM | POA: Diagnosis not present

## 2017-07-02 DIAGNOSIS — Z833 Family history of diabetes mellitus: Secondary | ICD-10-CM | POA: Diagnosis not present

## 2017-07-02 DIAGNOSIS — G894 Chronic pain syndrome: Secondary | ICD-10-CM | POA: Diagnosis not present

## 2017-07-02 DIAGNOSIS — R825 Elevated urine levels of drugs, medicaments and biological substances: Secondary | ICD-10-CM | POA: Diagnosis not present

## 2017-07-02 DIAGNOSIS — Z8249 Family history of ischemic heart disease and other diseases of the circulatory system: Secondary | ICD-10-CM | POA: Diagnosis not present

## 2017-07-02 DIAGNOSIS — F11259 Opioid dependence with opioid-induced psychotic disorder, unspecified: Secondary | ICD-10-CM | POA: Diagnosis not present

## 2017-07-02 DIAGNOSIS — R208 Other disturbances of skin sensation: Secondary | ICD-10-CM | POA: Diagnosis not present

## 2017-07-02 DIAGNOSIS — M549 Dorsalgia, unspecified: Secondary | ICD-10-CM | POA: Diagnosis present

## 2017-07-02 DIAGNOSIS — Z86718 Personal history of other venous thrombosis and embolism: Secondary | ICD-10-CM | POA: Diagnosis not present

## 2017-07-02 DIAGNOSIS — F329 Major depressive disorder, single episode, unspecified: Secondary | ICD-10-CM | POA: Diagnosis present

## 2017-07-02 DIAGNOSIS — Z91018 Allergy to other foods: Secondary | ICD-10-CM | POA: Diagnosis not present

## 2017-07-02 DIAGNOSIS — Z91040 Latex allergy status: Secondary | ICD-10-CM | POA: Diagnosis not present

## 2017-07-02 DIAGNOSIS — D638 Anemia in other chronic diseases classified elsewhere: Secondary | ICD-10-CM | POA: Diagnosis not present

## 2017-07-02 DIAGNOSIS — T402X5A Adverse effect of other opioids, initial encounter: Secondary | ICD-10-CM | POA: Diagnosis not present

## 2017-07-02 DIAGNOSIS — K219 Gastro-esophageal reflux disease without esophagitis: Secondary | ICD-10-CM | POA: Diagnosis present

## 2017-07-02 DIAGNOSIS — Z87891 Personal history of nicotine dependence: Secondary | ICD-10-CM | POA: Diagnosis not present

## 2017-07-02 DIAGNOSIS — Z832 Family history of diseases of the blood and blood-forming organs and certain disorders involving the immune mechanism: Secondary | ICD-10-CM | POA: Diagnosis not present

## 2017-07-02 DIAGNOSIS — R079 Chest pain, unspecified: Secondary | ICD-10-CM | POA: Diagnosis not present

## 2017-07-02 DIAGNOSIS — Z79899 Other long term (current) drug therapy: Secondary | ICD-10-CM | POA: Diagnosis not present

## 2017-07-02 DIAGNOSIS — K5900 Constipation, unspecified: Secondary | ICD-10-CM | POA: Diagnosis present

## 2017-07-02 DIAGNOSIS — J9811 Atelectasis: Secondary | ICD-10-CM | POA: Diagnosis present

## 2017-07-02 DIAGNOSIS — D72829 Elevated white blood cell count, unspecified: Secondary | ICD-10-CM | POA: Diagnosis not present

## 2017-07-02 DIAGNOSIS — Z9049 Acquired absence of other specified parts of digestive tract: Secondary | ICD-10-CM | POA: Diagnosis not present

## 2017-07-02 DIAGNOSIS — Z7901 Long term (current) use of anticoagulants: Secondary | ICD-10-CM | POA: Diagnosis not present

## 2017-07-02 DIAGNOSIS — Z79891 Long term (current) use of opiate analgesic: Secondary | ICD-10-CM | POA: Diagnosis not present

## 2017-07-02 DIAGNOSIS — R0902 Hypoxemia: Secondary | ICD-10-CM | POA: Diagnosis not present

## 2017-07-02 DIAGNOSIS — R000 Tachycardia, unspecified: Secondary | ICD-10-CM | POA: Diagnosis present

## 2017-07-02 DIAGNOSIS — R1011 Right upper quadrant pain: Secondary | ICD-10-CM | POA: Diagnosis not present

## 2017-07-02 DIAGNOSIS — K769 Liver disease, unspecified: Secondary | ICD-10-CM | POA: Diagnosis not present

## 2017-07-02 DIAGNOSIS — F1190 Opioid use, unspecified, uncomplicated: Secondary | ICD-10-CM | POA: Diagnosis not present

## 2017-07-02 LAB — RAPID URINE DRUG SCREEN, HOSP PERFORMED
AMPHETAMINES: NOT DETECTED
Barbiturates: NOT DETECTED
Benzodiazepines: NOT DETECTED
COCAINE: POSITIVE — AB
OPIATES: POSITIVE — AB
TETRAHYDROCANNABINOL: NOT DETECTED

## 2017-07-02 LAB — CBC WITH DIFFERENTIAL/PLATELET
BASOS PCT: 0 %
Band Neutrophils: 0 %
Basophils Absolute: 0 10*3/uL (ref 0.0–0.1)
Blasts: 0 %
EOS PCT: 1 %
Eosinophils Absolute: 0.3 10*3/uL (ref 0.0–0.7)
HCT: 21.5 % — ABNORMAL LOW (ref 36.0–46.0)
Hemoglobin: 7.4 g/dL — ABNORMAL LOW (ref 12.0–15.0)
LYMPHS ABS: 2.9 10*3/uL (ref 0.7–4.0)
LYMPHS PCT: 10 %
MCH: 29.1 pg (ref 26.0–34.0)
MCHC: 34.4 g/dL (ref 30.0–36.0)
MCV: 84.6 fL (ref 78.0–100.0)
MONO ABS: 2.6 10*3/uL — AB (ref 0.1–1.0)
Metamyelocytes Relative: 0 %
Monocytes Relative: 9 %
Myelocytes: 0 %
NRBC: 3 /100{WBCs} — AB
Neutro Abs: 22.9 10*3/uL — ABNORMAL HIGH (ref 1.7–7.7)
Neutrophils Relative %: 80 %
OTHER: 0 %
PLATELETS: 480 10*3/uL — AB (ref 150–400)
Promyelocytes Absolute: 0 %
RBC: 2.54 MIL/uL — ABNORMAL LOW (ref 3.87–5.11)
RDW: 22.2 % — AB (ref 11.5–15.5)
WBC: 28.7 10*3/uL — ABNORMAL HIGH (ref 4.0–10.5)

## 2017-07-02 LAB — COMPREHENSIVE METABOLIC PANEL
ALT: 34 U/L (ref 14–54)
ANION GAP: 10 (ref 5–15)
AST: 43 U/L — ABNORMAL HIGH (ref 15–41)
Albumin: 4.1 g/dL (ref 3.5–5.0)
Alkaline Phosphatase: 77 U/L (ref 38–126)
BUN: 9 mg/dL (ref 6–20)
CHLORIDE: 100 mmol/L — AB (ref 101–111)
CO2: 26 mmol/L (ref 22–32)
Calcium: 8.7 mg/dL — ABNORMAL LOW (ref 8.9–10.3)
Creatinine, Ser: 0.79 mg/dL (ref 0.44–1.00)
GFR calc non Af Amer: >60 mL/min (ref >60)
Glucose, Bld: 114 mg/dL — ABNORMAL HIGH (ref 65–99)
Potassium: 3.6 mmol/L (ref 3.5–5.1)
SODIUM: 136 mmol/L (ref 135–145)
Total Bilirubin: 4.5 mg/dL — ABNORMAL HIGH (ref 0.3–1.2)
Total Protein: 7.3 g/dL (ref 6.5–8.1)

## 2017-07-02 LAB — RETICULOCYTES
RBC.: 2.54 MIL/uL — ABNORMAL LOW (ref 3.87–5.11)
Retic Ct Pct: >23 % — ABNORMAL HIGH (ref 0.4–3.1)

## 2017-07-02 LAB — I-STAT BETA HCG BLOOD, ED (MC, WL, AP ONLY)

## 2017-07-02 MED ORDER — HYDROMORPHONE HCL 2 MG/ML IJ SOLN: 2.0000 mg | INTRAMUSCULAR | Status: AC

## 2017-07-02 MED ORDER — RIVAROXABAN 20 MG PO TABS
20.0000 mg | ORAL_TABLET | Freq: Every day | ORAL | Status: DC
  Administered 2017-07-02 – 2017-07-06 (×5): 20 mg via ORAL
  Filled 2017-07-02 (×5): qty 1

## 2017-07-02 MED ORDER — POLYETHYLENE GLYCOL 3350 17 G PO PACK
17.0000 g | PACK | Freq: Every day | ORAL | Status: DC | PRN
  Administered 2017-07-03 – 2017-07-05 (×2): 17 g via ORAL
  Filled 2017-07-02 (×2): qty 1

## 2017-07-02 MED ORDER — ASPIRIN-ACETAMINOPHEN-CAFFEINE 250-250-65 MG PO TABS
2.0000 | ORAL_TABLET | Freq: Four times a day (QID) | ORAL | Status: DC | PRN
  Administered 2017-07-03: 2 via ORAL
  Filled 2017-07-02 (×3): qty 2

## 2017-07-02 MED ORDER — SODIUM CHLORIDE 0.9% FLUSH: 9.0000 mL | INTRAVENOUS | Status: DC | PRN

## 2017-07-02 MED ORDER — HYDROXYUREA 500 MG PO CAPS
1000.0000 mg | ORAL_CAPSULE | ORAL | Status: DC
  Administered 2017-07-03 – 2017-07-07 (×3): 1000 mg via ORAL
  Filled 2017-07-02 (×3): qty 2

## 2017-07-02 MED ORDER — MORPHINE SULFATE ER 30 MG PO TBCR
30.0000 mg | EXTENDED_RELEASE_TABLET | Freq: Two times a day (BID) | ORAL | Status: DC
  Administered 2017-07-02 – 2017-07-07 (×11): 30 mg via ORAL
  Filled 2017-07-02 (×11): qty 1

## 2017-07-02 MED ORDER — HYDROMORPHONE HCL 2 MG/ML IJ SOLN
2.0000 mg | INTRAMUSCULAR | Status: AC
  Administered 2017-07-02: 2 mg via INTRAVENOUS
  Filled 2017-07-02: qty 1

## 2017-07-02 MED ORDER — HYDROXYUREA 500 MG PO CAPS
500.0000 mg | ORAL_CAPSULE | ORAL | Status: DC
  Administered 2017-07-02 – 2017-07-06 (×3): 500 mg via ORAL
  Filled 2017-07-02 (×4): qty 1

## 2017-07-02 MED ORDER — CYANOCOBALAMIN 250 MCG PO TABS
250.0000 ug | ORAL_TABLET | Freq: Every day | ORAL | Status: DC
  Administered 2017-07-03 – 2017-07-07 (×5): 250 ug via ORAL
  Filled 2017-07-02 (×7): qty 1

## 2017-07-02 MED ORDER — PROMETHAZINE HCL 25 MG/ML IJ SOLN
25.0000 mg | Freq: Once | INTRAMUSCULAR | Status: AC
  Administered 2017-07-02: 25 mg via INTRAVENOUS
  Filled 2017-07-02: qty 1

## 2017-07-02 MED ORDER — DEXTROSE-NACL 5-0.45 % IV SOLN
INTRAVENOUS | Status: DC
  Administered 2017-07-02: 1000 mL via INTRAVENOUS
  Administered 2017-07-02 – 2017-07-03 (×2): via INTRAVENOUS

## 2017-07-02 MED ORDER — KETOROLAC TROMETHAMINE 30 MG/ML IJ SOLN
30.0000 mg | Freq: Four times a day (QID) | INTRAMUSCULAR | Status: AC
  Administered 2017-07-02 – 2017-07-07 (×20): 30 mg via INTRAVENOUS
  Filled 2017-07-02 (×20): qty 1

## 2017-07-02 MED ORDER — HEPARIN SOD (PORK) LOCK FLUSH 100 UNIT/ML IV SOLN: 500.0000 [IU] | Freq: Once | INTRAVENOUS | Status: DC | PRN

## 2017-07-02 MED ORDER — DIPHENHYDRAMINE HCL 25 MG PO CAPS: 25.0000 mg | ORAL_CAPSULE | ORAL | Status: DC | PRN

## 2017-07-02 MED ORDER — DEXTROSE-NACL 5-0.45 % IV SOLN
INTRAVENOUS | Status: DC
  Administered 2017-07-02 – 2017-07-05 (×3): via INTRAVENOUS

## 2017-07-02 MED ORDER — ONDANSETRON HCL 4 MG/2ML IJ SOLN: 4.0000 mg | Freq: Four times a day (QID) | INTRAMUSCULAR | Status: DC | PRN

## 2017-07-02 MED ORDER — HYDROMORPHONE 1 MG/ML IV SOLN
INTRAVENOUS | Status: DC
  Filled 2017-07-02: qty 25

## 2017-07-02 MED ORDER — CYANOCOBALAMIN-CAFFEINE 250-50 MCG-MG PO CHEW: CHEWABLE_TABLET | Freq: Every day | ORAL | Status: DC

## 2017-07-02 MED ORDER — KETOROLAC TROMETHAMINE 30 MG/ML IJ SOLN
30.0000 mg | INTRAMUSCULAR | Status: AC
  Administered 2017-07-02: 30 mg via INTRAVENOUS
  Filled 2017-07-02: qty 1

## 2017-07-02 MED ORDER — DIPHENHYDRAMINE HCL 50 MG/ML IJ SOLN
25.0000 mg | INTRAMUSCULAR | Status: DC | PRN
  Administered 2017-07-02 (×2): 25 mg via INTRAVENOUS
  Filled 2017-07-02 (×2): qty 1

## 2017-07-02 MED ORDER — SODIUM CHLORIDE 0.9 % IV SOLN
25.0000 mg | INTRAVENOUS | Status: DC | PRN
  Filled 2017-07-02: qty 0.5

## 2017-07-02 MED ORDER — ONDANSETRON 4 MG PO TBDP
4.0000 mg | ORAL_TABLET | Freq: Once | ORAL | Status: AC
  Administered 2017-07-02: 4 mg via ORAL
  Filled 2017-07-02: qty 1

## 2017-07-02 MED ORDER — HYDROMORPHONE HCL 2 MG/ML IJ SOLN
2.0000 mg | INTRAMUSCULAR | Status: AC
  Administered 2017-07-02: 2 mg via INTRAVENOUS

## 2017-07-02 MED ORDER — HYDROMORPHONE 1 MG/ML IV SOLN
INTRAVENOUS | Status: DC
  Administered 2017-07-02 (×2): 5.85 mg via INTRAVENOUS
  Administered 2017-07-02: 4.9 mg via INTRAVENOUS
  Administered 2017-07-02: 3.85 mg via INTRAVENOUS
  Administered 2017-07-03: 5.75 mg via INTRAVENOUS
  Administered 2017-07-03: 12.5 mg via INTRAVENOUS
  Administered 2017-07-03: 8.5 mg via INTRAVENOUS
  Administered 2017-07-03: 7 mg via INTRAVENOUS
  Administered 2017-07-03: 5.25 mg via INTRAVENOUS
  Administered 2017-07-04: 10.39 mg via INTRAVENOUS
  Administered 2017-07-04: 7 mg via INTRAVENOUS
  Administered 2017-07-04: 3.7 mg via INTRAVENOUS
  Administered 2017-07-04: 4 mg via INTRAVENOUS
  Administered 2017-07-04: 20:00:00 via INTRAVENOUS
  Administered 2017-07-04: 9 mg via INTRAVENOUS
  Administered 2017-07-04: 03:00:00 via INTRAVENOUS
  Administered 2017-07-05: 0.5 mg via INTRAVENOUS
  Administered 2017-07-05: 7 mg via INTRAVENOUS
  Administered 2017-07-05: 7.5 mg via INTRAVENOUS
  Administered 2017-07-05: 9 mg via INTRAVENOUS
  Filled 2017-07-02 (×4): qty 25

## 2017-07-02 MED ORDER — SENNOSIDES-DOCUSATE SODIUM 8.6-50 MG PO TABS
1.0000 | ORAL_TABLET | Freq: Two times a day (BID) | ORAL | Status: DC
  Administered 2017-07-02 – 2017-07-07 (×11): 1 via ORAL
  Filled 2017-07-02 (×11): qty 1

## 2017-07-02 MED ORDER — HYDROMORPHONE HCL 1 MG/ML IJ SOLN
1.0000 mg | INTRAMUSCULAR | Status: AC
  Administered 2017-07-02: 1 mg via INTRAVENOUS
  Filled 2017-07-02: qty 1

## 2017-07-02 MED ORDER — ENSURE ENLIVE PO LIQD: 237.0000 mL | Freq: Two times a day (BID) | ORAL | Status: DC

## 2017-07-02 MED ORDER — HYDROMORPHONE HCL 2 MG/ML IJ SOLN
2.0000 mg | INTRAMUSCULAR | Status: AC
  Administered 2017-07-02 (×2): 2 mg via INTRAVENOUS
  Filled 2017-07-02 (×2): qty 1

## 2017-07-02 MED ORDER — HYDROMORPHONE HCL 1 MG/ML IJ SOLN
0.5000 mg | Freq: Once | INTRAMUSCULAR | Status: AC
  Administered 2017-07-02: 0.5 mg via SUBCUTANEOUS
  Filled 2017-07-02: qty 1

## 2017-07-02 MED ORDER — ADULT MULTIVITAMIN W/MINERALS CH
1.0000 | ORAL_TABLET | Freq: Every day | ORAL | Status: DC
  Administered 2017-07-02 – 2017-07-07 (×7): 1 via ORAL
  Filled 2017-07-02 (×7): qty 1

## 2017-07-02 MED ORDER — NALOXONE HCL 0.4 MG/ML IJ SOLN: 0.4000 mg | INTRAMUSCULAR | Status: DC | PRN

## 2017-07-02 MED ORDER — HYDROMORPHONE HCL 2 MG/ML IJ SOLN
2.0000 mg | Freq: Once | INTRAMUSCULAR | Status: AC
  Administered 2017-07-02: 2 mg via INTRAVENOUS
  Filled 2017-07-02: qty 1

## 2017-07-02 NOTE — ED Provider Notes (Signed)
[facility] DEPT Provider Note   CSN: [id number] Arrival date & time: 07/01/17  2345     History   Chief Complaint Chief Complaint  Patient presents with  . Sickle Cell Pain Crisis    HPI [name] is a 27 y.o. female.  The history is provided by the patient.  She has a history of sickle cell disease, GERD, depression and comes in with sudden onset about 3 PM of sharp pains from her jaw to her knee.  This is typical of her sickle cell pain.  Pain is rated 10/10.  She took her home pain medication without relief.  She denies cough, fever, chills, sweats, nausea, vomiting, dysuria.  Nothing makes pain better, nothing makes it worse.  Past Medical History:  Diagnosis Date  . Blood transfusion    "lots"  . Blood transfusion without reported diagnosis   . Chronic back pain    "very severe; have knot in my back; from tight muscle; take RX and exercise for it"  . Depression 01/06/2011  . Exertional dyspnea    "sometimes"  . Genital HSV   . GERD (gastroesophageal reflux disease) 02/17/2011  . Migraines 11/08/11   "@ least twice/month"  . 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  . Mood swings 11/08/11   "I go back and forth; real bad"  . Sickle cell anemia ([name])   . Sickle cell anemia with crisis ([place])   . Thrombocytosis ([place]) 11/09/2015  . Trichotillomania    h/o    Patient Active Problem List   Diagnosis Date Noted  . Menorrhagia   . Sickle cell pain crisis ([place]) 05/04/2017  . Leukocytosis   . Chronic anticoagulation   . Sickle cell anemia ([place]) 05/19/2016  . History of DVT (deep vein thrombosis) 04/17/2016  . Thrombocytosis ([place]) 11/09/2015  . Hyperbilirubinemia 11/09/2015  . Chronic pain 08/04/2015  . Herpes simplex 07/14/2015  . Anemia of chronic disease   . Sickle cell crisis ([place]) 08/09/2014  . 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 CHOLECYSTECTOMY  05/2010  . DILATION AND CURETTAGE OF UTERUS  02/20/11   S/P miscarriage  . IR GENERIC HISTORICAL  12/23/2015   IR FLUORO GUIDE CV LINE RIGHT 12/23/2015 Jacqulynn Cadet, MD [facility]  . IR GENERIC HISTORICAL  12/23/2015   IR US GUIDE VASC ACCESS RIGHT 12/23/2015 Jacqulynn Cadet, MD [facility]    OB History    Gravida Para Term Preterm AB Living   5 2 2  0 3 2   SAB TAB Ectopic Multiple Live Births   3 0 0 0 2      Obstetric Comments   Miscarried in October 2012 at about 7 weeks       Home Medications    Prior to Admission medications   Medication Sig Start Date End Date Taking? Authorizing Provider  aspirin-acetaminophen-caffeine (EXCEDRIN MIGRAINE) [phone] MG tablet Take 2 tablets every 6 (six) hours as needed by mouth for headache.   Yes [provider]  Cyanocobalamin-Caffeine (ENERGY CHEWS PO) Take 2 tablets daily by mouth.   Yes [provider]  hydroxyurea (HYDREA) 500 MG capsule Take 2 tabs (1000 mg ) PO QOD starting on 06/02/2017 alternating with 1 tab (500 mg) PO QOD starting on 06/03/2017. May take with food to minimize GI side effects. 06/01/17  Yes [name], MD  medroxyPROGESTERone (DEPO-PROVERA) 150 MG/ML injection Inject 1 mL (150 mg total) into the muscle every 3 (three) months. 09/21/16  Yes Shelly Bombard, MD  morphine (MS CONTIN) 30 MG 12 hr tablet Take 30 mg by mouth every 12 (twelve) hours.   Yes [provider]  Multiple Vitamin (MULTIVITAMIN WITH MINERALS) TABS tablet Take 1 tablet daily by mouth.   Yes [provider]  Oxycodone HCl 20 MG TABS Take 20 mg every 4 (four) hours as needed by mouth (pain).  01/17/17  Yes [provider]  rivaroxaban (XARELTO) 20 MG TABS tablet Take 1 tablet (20 mg total) by mouth daily with supper. 04/27/17  Yes Scot Jun, FNP  morphine (MS CONTIN) 60 MG 12 hr tablet MS Contin 60 mg PO BID x 2  days then MS Contin 60 mg po q HS x 2 days then resume previous schedule of MS Contin 60 mg PO BID PRN crisis. Patient not taking: Reported on 07/02/2017 05/29/17   [name], MD    Family History Family History  Problem Relation Age of Onset  . Sickle cell trait Mother   . Sickle cell trait Father   . Diabetes Maternal Grandmother   . Diabetes Paternal Grandmother   . Hypertension Paternal Grandmother   . Diabetes Maternal Grandfather     Social History Social History   Tobacco Use  . Smoking status: Former Smoker    Packs/day: 0.25    Years: 1.00    Pack years: 0.25    Types: Cigarettes    Last attempt to quit: 03/25/2013    Years since quitting: 4.2  . Smokeless tobacco: Never Used  Substance Use Topics  . Alcohol use: No    Comment: denies  . Drug use: No    Comment: "Clean for 3 months"     Allergies   Food and Latex   Review of Systems Review of Systems  All other systems reviewed and are negative.    Physical Exam Updated Vital Signs BP 118/87 (BP Location: Right Arm)   Pulse 98   Temp 100 F (37.8 C) (Oral)   Resp 16   Ht 5\' 1"  (1.549 m)   Wt 93 kg (205 lb)   SpO2 98%   BMI 38.73 kg/m   Physical Exam  Nursing note and vitals reviewed.  27 year old female, crying in pain, but in no acute distress. Vital signs are normal. Oxygen saturation is 98%, which is normal. Head is normocephalic and atraumatic. PERRLA, EOMI. Oropharynx is clear. Neck is nontender and supple without adenopathy or JVD. Back is nontender and there is no CVA tenderness. Lungs are clear without rales, wheezes, or rhonchi. Chest is nontender.  Mediport is present on the right. Heart has regular rate and rhythm without murmur. Abdomen is soft, flat, nontender without masses or hepatosplenomegaly and peristalsis is normoactive. Extremities have no cyanosis or edema, full range of motion is present. Skin is warm and dry without rash. Neurologic: Mental status is  normal, cranial nerves are intact, there are no motor or sensory deficits.  ED Treatments / Results  Labs (all labs ordered are listed, but only abnormal results are displayed) Labs Reviewed  COMPREHENSIVE METABOLIC PANEL - Abnormal; Notable for the following components:      Result Value   Chloride 100 (*)    Glucose, Bld 114 (*)    Calcium 8.7 (*)    AST 43 (*)    Total Bilirubin 4.5 (*)  All other components within normal limits  CBC WITH DIFFERENTIAL/PLATELET - Abnormal; Notable for the following components:   WBC 28.7 (*)    RBC 2.54 (*)    Hemoglobin 7.4 (*)    HCT 21.5 (*)    RDW 22.2 (*)    Platelets 480 (*)    nRBC 3 (*)    Neutro Abs 22.9 (*)    Monocytes Absolute 2.6 (*)    All other components within normal limits  RETICULOCYTES - Abnormal; Notable for the following components:   Retic Ct Pct >23.0 (*)    RBC. 2.54 (*)    All other components within normal limits  RAPID URINE DRUG SCREEN, HOSP PERFORMED  I-STAT BETA HCG BLOOD, ED ([facility], [facility], [facility] ONLY)   Procedures Procedures  Medications Ordered in ED Medications  dextrose 5 %-0.45 % sodium chloride infusion ( Intravenous New Bag/Given 07/02/17 0107)  diphenhydrAMINE (BENADRYL) injection 25 mg (25 mg Intravenous Given 07/02/17 0107)  HYDROmorphone (DILAUDID) injection 2 mg (0 mg Intravenous Not Given 07/02/17 0345)    Or  HYDROmorphone (DILAUDID) injection 2 mg ( Subcutaneous See Alternative 07/02/17 0345)  HYDROmorphone (DILAUDID) injection 0.5 mg (0.5 mg Subcutaneous Given 07/02/17 0035)  ondansetron (ZOFRAN-ODT) disintegrating tablet 4 mg (4 mg Oral Given 07/02/17 0035)  ketorolac (TORADOL) 30 MG/ML injection 30 mg (30 mg Intravenous Given 07/02/17 0107)  promethazine (PHENERGAN) injection 25 mg (25 mg Intravenous Given 07/02/17 0210)  HYDROmorphone (DILAUDID) injection 2 mg (2 mg Intravenous Given 07/02/17 0106)    Or  HYDROmorphone (DILAUDID) injection 2 mg ( Subcutaneous See Alternative 07/02/17 0106)   HYDROmorphone (DILAUDID) injection 2 mg (2 mg Intravenous Given 07/02/17 0210)    Or  HYDROmorphone (DILAUDID) injection 2 mg ( Subcutaneous See Alternative 07/02/17 0210)  HYDROmorphone (DILAUDID) injection 2 mg (2 mg Intravenous Given 07/02/17 0424)    Or  HYDROmorphone (DILAUDID) injection 2 mg ( Subcutaneous See Alternative 07/02/17 0424)     Initial Impression / Assessment and Plan / ED Course  I have reviewed the triage vital signs and the nursing notes.  Pertinent labs & imaging results that were available during my care of the patient were reviewed by me and considered in my medical decision making (see chart for details).  Sickle cell pain crisis.  Old records are reviewed, and she has multiple ED visits and hospitalizations for same.  Most recent ED visit was January 27, and most recent admission was January 12.  She started on sickle cell pain protocol.  After 3 doses of her phone, patient still is complaining of severe pain, so she needs to be admitted.  Case is discussed with Dr. Tamala Julian of [facility], who agrees to admit the patient.  Final Clinical Impressions(s) / ED Diagnoses   Final diagnoses:  Acute sickle cell crisis [facility] [place] [place])    ED Discharge Orders    None       Delora Fuel, MD 01/75/10 [phone]

## 2017-07-02 NOTE — ED Notes (Signed)
ED TO INPATIENT HANDOFF REPORT  Name/Age/Gender [name] 27 y.o. female  Code Status Code Status History    Date Active Date Inactive Code Status Order ID Comments User Context   05/27/2017 21:39 06/01/2017 19:37 Full Code [id number]  Etta Quill, DO ED   05/04/2017 21:05 05/07/2017 21:27 Full Code [id number]  Rise Patience, MD ED   04/01/2017 18:13 04/03/2017 18:41 Full Code [id number]  Aline August, MD ED   02/28/2017 00:45 03/05/2017 21:33 Full Code [id number]  Etta Quill, DO ED   02/02/2017 03:09 02/04/2017 22:50 Full Code [id number]  Bethena Roys, MD Inpatient   12/10/2016 04:03 12/14/2016 17:05 Full Code [id number]  Etta Quill, DO ED   11/06/2016 20:39 11/11/2016 16:59 Full Code [id number]  Patrecia Pour, MD Inpatient   10/06/2016 16:47 10/09/2016 18:09 Full Code [id number]  [name], MD ED   10/02/2016 15:12 10/05/2016 10:42 Full Code [id number]  Tresa Garter, MD Inpatient   09/15/2016 17:23 09/19/2016 20:25 Full Code [id number]  Dorena Dew, [place] Inpatient   08/20/2016 18:12 08/30/2016 16:13 Full Code [id number]  [name], MD Inpatient   07/25/2016 23:20 07/29/2016 21:26 Full Code [id number]  Norval Morton, MD ED   06/26/2016 21:54 07/02/2016 19:54 Full Code [id number]  Etta Quill, DO ED   06/10/2016 05:11 06/15/2016 15:11 Full Code [id number]  Etta Quill, DO ED   05/29/2016 20:07 05/31/2016 18:03 Full Code [id number]  Cristal Ford, DO Inpatient   05/19/2016 19:14 05/25/2016 20:05 Full Code [id number]  Charlynne Cousins, MD Inpatient   04/24/2016 10:59 05/02/2016 17:50 Full Code [id number]  Elwyn Reach, MD Inpatient   04/17/2016 21:05 04/20/2016 19:13 Full Code [id number]  Reubin Milan, MD Inpatient   04/17/2016 21:05 04/17/2016 21:05 Full Code [id number]  Reubin Milan, MD Inpatient   03/31/2016 16:38 04/07/2016 18:08 Full Code [id number]  [name], MD Inpatient   02/26/2016 18:59 03/03/2016 21:04  Full Code [id number]  [name], MD Inpatient   02/16/2016 10:33 02/17/2016 11:01 Full Code [id number]  Tresa Garter, MD Inpatient   01/30/2016 07:51 02/05/2016 19:04 Full Code [id number]  Ivor Costa, MD ED   01/05/2016 08:37 01/09/2016 17:27 Full Code [id number]  Tresa Garter, MD Inpatient   12/27/2015 03:58 12/29/2015 20:09 Full Code [id number]  Ivor Costa, MD ED   11/09/2015 01:18 11/12/2015 18:37 Full Code [id number]  Norval Morton, MD ED   10/31/2015 18:54 11/02/2015 14:56 Full Code [id number]  Manya Silvas, [place] Inpatient   10/30/2015 14:46 10/31/2015 18:54 Full Code [id number]  Lavonia Drafts, MD Inpatient   10/26/2015 21:25 10/30/2015 14:46 Full Code [id number]  Gwynne Edinger, MD Inpatient   10/15/2015 18:57 10/18/2015 20:27 Full Code [id number]  [name], MD Inpatient   10/06/2015 22:58 10/10/2015 16:23 Full Code [id number]  Lily Kocher, MD ED   09/22/2015 20:26 09/27/2015 17:35 Full Code [id number]  Osborne Oman, MD Inpatient   09/07/2015 14:31 09/14/2015 14:54 Full Code [id number]  [name], MD Inpatient   08/10/2015 14:05 08/17/2015 16:38 Full Code [id number]  [name], MD Inpatient   07/24/2015 05:31 08/02/2015 18:17 Full Code [id number]  Carlyle Dolly, MD Inpatient   07/24/2015 04:47 07/24/2015 05:31 Full Code [id number]  Carlyle Dolly, MD Inpatient   07/24/2015 03:53 07/24/2015 04:47 Full Code [id number]  Carlyle Dolly, MD Inpatient   07/12/2015 02:14 07/14/2015 20:28 Full Code [id number]  Ivor Costa, MD ED   06/28/2015 10:37  07/03/2015 14:41 Full Code [id number]  [name], MD Inpatient   05/31/2015 14:59 06/05/2015 20:49 Full Code [id number]  [name], MD ED   05/02/2015 16:39 05/08/2015 16:44 Full Code [id number]  [name], MD Inpatient   04/14/2015 17:41 04/22/2015 20:06 Full Code [id number]  Tresa Garter, MD Inpatient   04/14/2015 17:41 04/14/2015 17:41 Full Code [id number]  Tresa Garter, MD Inpatient   04/14/2015 11:53 04/14/2015 17:41 Full Code [id number]  Tresa Garter, MD Inpatient   03/30/2015 11:04 04/03/2015 17:42 Full Code [id number]  Tresa Garter, MD Inpatient   03/22/2015 20:42 03/26/2015 17:47 Full Code [id number]  Reubin Milan, MD ED   01/25/2015 13:23 01/27/2015 15:54 Full Code [id number]  Elwyn Reach, MD Inpatient   12/07/2014 15:57 12/10/2014 17:20 Full Code [id number]  Keitha Butte, CNM Inpatient   12/05/2014 09:20 12/07/2014 15:57 Full Code [id number]  Tami Ribas, RN Inpatient   11/26/2014 22:16 12/01/2014 20:33 Full Code [id number]  Virginia Crews, MD Inpatient   11/19/2014 01:29 11/21/2014 18:12 Full Code [id number]  Seabron Spates, [place] Inpatient   10/23/2014 21:18 10/30/2014 19:32 Full Code [id number]  Truett Mainland, DO Inpatient   10/11/2014 00:10 10/14/2014 17:07 Full Code [id number]  Larey Days, [place] Inpatient   09/18/2014 15:45 09/26/2014 16:56 Full Code [id number]  Lavonia Drafts, MD Inpatient   09/08/2014 01:17 09/10/2014 18:21 Full Code [id number]  Lavonia Drafts, MD Inpatient   09/08/2014 01:17 09/08/2014 01:17 Full Code [id number]  Lavonia Drafts, MD Inpatient   08/09/2014 21:37 08/15/2014 20:48 Full Code [id number]  Tresea Mall, CNM Inpatient   07/14/2014 03:01 07/16/2014 16:53 Full Code [id number]  Brantley Persons, RN Inpatient   06/25/2014 16:51 06/28/2014 17:05 Full Code [id number]  [name], MD Inpatient   06/02/2014 01:06 06/05/2014 19:36 Full Code [id number]  Elberta Leatherwood, MD Inpatient   05/03/2014 18:18 05/06/2014 21:58 Full Code [id number]  Markus Daft, MD Inpatient   05/02/2014 12:07 05/03/2014 18:18 Full Code [id number]  [name], MD ED   04/10/2014 16:41 04/18/2014 21:17 Full Code [id number]  Greggory Keen, MD Inpatient   04/09/2014 10:28 04/10/2014 16:41 Full Code [id number]  Theodis Blaze, MD Inpatient   02/16/2014 20:20 02/25/2014 14:11 Full Code [id number]   Caren Griffins, MD ED   01/29/2014 03:43 02/01/2014 14:42 Full Code [id number]  Rise Patience, MD Inpatient   01/13/2014 18:15 01/16/2014 13:04 Full Code [id number]  Robbie Lis, MD Inpatient   11/25/2013 14:37 11/30/2013 20:09 Full Code [id number]  Verlee Monte, MD Inpatient   09/24/2013 06:15 09/27/2013 15:32 Full Code [id number]  Rise Patience, MD Inpatient   07/31/2013 03:12 08/04/2013 20:47 Full Code [id number]  Etta Quill, DO ED   07/16/2013 09:07 07/16/2013 23:10 Full Code [id number]  [name], MD Inpatient   05/28/2013 05:15 06/01/2013 21:17 Full Code [id number]  Berle Mull, MD ED   03/23/2013 04:30 03/29/2013 19:37 Full Code [id number]  Theressa Millard, MD ED   02/11/2013 08:03 02/14/2013 16:39 Full Code [id number]  Rise Patience, MD Inpatient   01/27/2013 12:57 01/30/2013 18:53 Full Code [id number]  Domenic Polite, MD Inpatient   10/05/2012 21:54 10/12/2012 21:07 Full Code [id number]  Theressa Millard, MD ED   08/27/2012 19:55 08/31/2012 18:33 Full Code [id number]  Thurnell Lose, MD Inpatient   06/22/2012 14:36 06/27/2012 14:57 Full Code [id number]  Jorene Guest, RN Inpatient   03/28/2012 21:22 04/04/2012 19:29 Full Code [id number]  Verl Dicker, [place] ED   03/24/2012 04:32 03/25/2012 11:44 Full Code [id number]  Martie Lee, [place] ED   03/04/2012 04:48 03/15/2012 22:58 Full Code [id number]  Kennith Center, RN Inpatient   02/13/2012 06:47 02/17/2012 01:24 Full Code [id number]  Jacqualyn Posey, RN Inpatient   12/17/2011 16:14 12/22/2011 17:16 Full Code [id number]  Theodis Blaze, MD Inpatient   12/16/2011 22:05 12/17/2011 16:14 Full Code [id number]  Threasa Beards, MD ED   11/30/2011 03:09 12/03/2011 17:45 Full Code [id number]  Evelina Dun, RN ED   11/08/2011 07:23 11/14/2011 18:23 Full Code [id number]  Ron Parker, RN ED   09/05/2011 02:04 09/05/2011 07:32 Full Code [id number]  Garald Balding, NP ED   08/05/2011 07:55 08/05/2011 18:52 Full Code [id number]  Rainey Pines, RN ED    08/05/2011 00:28 08/05/2011 07:55 Full Code [id number]  Charlena Cross, MD ED   07/13/2011 11:49 07/15/2011 18:47 Full Code [id number]  Julieta Bellini, NP ED   06/09/2011 00:48 06/09/2011 09:59 Full Code [id number]  Charlena Cross, MD ED   05/31/2011 18:28 06/01/2011 18:37 Full Code [id number]  Jenel Lucks, RN Inpatient   05/26/2011 02:18 05/26/2011 14:19 Full Code [id number]  Wynetta Fines, MD ED   05/06/2011 07:00 05/08/2011 16:09 Full Code [id number]  Piloto de Tressia Miners, MD Inpatient   04/17/2011 13:29 04/19/2011 16:50 Full Code [id number]  Judithann Sheen, MD ED   03/30/2011 00:39 04/03/2011 00:25 Full Code [id number]  Hoy Morn, MD ED      Home/SNF/Other Home  Chief Complaint sickle cell crisis  Level of Care/Admitting Diagnosis ED Disposition    ED Disposition Condition [facility] Area: [facility] [[id number]]  Level of Care: Med-Surg [16]  Diagnosis: Sickle cell crisis [facility]) [[id number]]  Admitting Physician: Elwyn Reach [[id number]]  Attending Physician: Elwyn Reach [[id number]]  Estimated length of stay: 3 - 4 days  Certification:: I certify this patient will need inpatient services for at least 2 midnights  PT Class (Do Not Modify): Inpatient [101]  PT Acc Code (Do Not Modify): Private [1]       Medical History Past Medical History:  Diagnosis Date  . Blood transfusion    "lots"  . Blood transfusion without reported diagnosis   . Chronic back pain    "very severe; have knot in my back; from tight muscle; take RX and exercise for it"  . Depression 01/06/2011  . Exertional dyspnea    "sometimes"  . Genital HSV   . GERD (gastroesophageal reflux disease) 02/17/2011  . Migraines 11/08/11   "@ least twice/month"  . 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  . Mood swings 11/08/11   "I go back and forth; real bad"  . Sickle cell anemia ([name])   . Sickle cell anemia with crisis ([place])   . Thrombocytosis ([place]) 11/09/2015  .  Trichotillomania    h/o    Allergies Allergies  Allergen Reactions  . Food Hives and Other (See Comments)    Pt is allergic to carrots.    . Latex Rash    IV Location/Drains/Wounds Patient Lines/Drains/Airways Status   Active Line/Drains/Airways    Name:   Placement date:   Placement time:   Site:   Days:   Implanted Port Right Chest   -    -    Chest      Peripheral IV 07/02/17 Left Antecubital   07/02/17    -  Antecubital   less than 1          Labs/Imaging Results for orders placed or performed during the hospital encounter of 07/01/17 (from the past 48 hour(s))  Comprehensive metabolic panel     Status: Abnormal   Collection Time: 07/02/17 12:24 AM  Result Value Ref Range   Sodium 136 135 - 145 mmol/L   Potassium 3.6 3.5 - 5.1 mmol/L   Chloride 100 (L) 101 - 111 mmol/L   CO2 26 22 - 32 mmol/L   Glucose, Bld 114 (H) 65 - 99 mg/dL   BUN 9 6 - 20 mg/dL   Creatinine, Ser 0.79 0.44 - 1.00 mg/dL   Calcium 8.7 (L) 8.9 - 10.3 mg/dL   Total Protein 7.3 6.5 - 8.1 g/dL   Albumin 4.1 3.5 - 5.0 g/dL   AST 43 (H) 15 - 41 U/L   ALT 34 14 - 54 U/L   Alkaline Phosphatase 77 38 - 126 U/L   Total Bilirubin 4.5 (H) 0.3 - 1.2 mg/dL    Comment: RESULTS CONFIRMED BY MANUAL DILUTION   GFR calc non Af Amer >60 >60 mL/min   GFR calc Af Amer >60 >60 mL/min    Comment: (NOTE) The eGFR has been calculated using the CKD EPI equation. This calculation has not been validated in all clinical situations. eGFR's persistently <60 mL/min signify possible Chronic Kidney Disease.    Anion gap 10 5 - 15    Comment: Performed at [facility], [place] [address], [place], [place] [address]  CBC with Differential     Status: Abnormal   Collection Time: 07/02/17 12:24 AM  Result Value Ref Range   WBC 28.7 (H) 4.0 - 10.5 K/uL   RBC 2.54 (L) 3.87 - 5.11 MIL/uL   Hemoglobin 7.4 (L) 12.0 - 15.0 g/dL   HCT 21.5 (L) 36.0 - 46.0 %   MCV 84.6 78.0 - 100.0 fL   MCH 29.1 26.0 - 34.0 pg    MCHC 34.4 30.0 - 36.0 g/dL   RDW 22.2 (H) 11.5 - 15.5 %   Platelets 480 (H) 150 - 400 K/uL   Neutrophils Relative % 80 %   Lymphocytes Relative 10 %   Monocytes Relative 9 %   Eosinophils Relative 1 %   Basophils Relative 0 %   Band Neutrophils 0 %   Metamyelocytes Relative 0 %   Myelocytes 0 %   Promyelocytes Absolute 0 %   Blasts 0 %   nRBC 3 (H) 0 /100 WBC   Other 0 %   Neutro Abs 22.9 (H) 1.7 - 7.7 K/uL   Lymphs Abs 2.9 0.7 - 4.0 K/uL   Monocytes Absolute 2.6 (H) 0.1 - 1.0 K/uL   Eosinophils Absolute 0.3 0.0 - 0.7 K/uL   Basophils Absolute 0.0 0.0 - 0.1 K/uL   RBC Morphology RARE NRBCs     Comment: POLYCHROMASIA PRESENT TARGET CELLS Sickle cells present    WBC Morphology WHITE COUNT CONFIRMED ON SMEAR     Comment: MILD LEFT SHIFT (1-5% METAS, OCC MYELO, OCC BANDS) Performed at [facility] [address], [place], [place] [address]   Reticulocytes     Status: Abnormal   Collection Time: 07/02/17 12:24 AM  Result Value Ref Range   Retic Ct Pct >23.0 (H) 0.4 - 3.1 %   RBC. 2.54 (L) 3.87 - 5.11 MIL/uL   Retic Count, Absolute NOT CALCULATED 19.0 - 186.0 K/uL    Comment: Performed at [facility], [place] [address], [place], [place] [address]  I-Stat beta hCG blood, ED     Status: None   Collection Time: 07/02/17 12:35 AM  Result Value Ref Range   I-stat hCG, quantitative <5.0 <5 mIU/mL   Comment 3            Comment:   GEST. AGE      CONC.  (mIU/mL)   <=1 WEEK        5 - 50     2 WEEKS       50 - 500     3 WEEKS       100 - 10,000     4 WEEKS     1,000 - 30,000        FEMALE AND NON-PREGNANT FEMALE:     LESS THAN 5 mIU/mL    *Note: Due to a large number of results and/or encounters for the requested time period, some results have not been displayed. A complete set of results can be found in Results Review.   No results found.  Pending Labs Unresulted Labs (From admission, onward)   Start     Ordered   07/02/17 3557   Respiratory Panel by PCR  (Respiratory virus panel)  Once,   R     07/02/17 0732   07/02/17 0103  Urine rapid drug screen (hosp performed)  STAT,   STAT     07/02/17 0102   Signed and Held  HIV antibody (Routine Testing)  Once,   R     Signed and Held   Signed and Held  Comprehensive metabolic panel  Tomorrow morning,   R     Signed and Held   Signed and Held  CBC with Differential/Platelet  Tomorrow morning,   R     Signed and Held      Vitals/Pain Today's Vitals   07/02/17 0732 07/02/17 0800 07/02/17 0800 07/02/17 0800  BP:  108/81 105/64   Pulse:  (!) 109 (!) 106   Resp:  (!) 21 (!) 24   Temp: (S) 99.1 F (37.3 C)     TempSrc: (S) Oral     SpO2:  97% 98%   Weight:      Height:      PainSc:    10-Worst pain ever    Isolation Precautions Droplet precaution  Medications Medications  dextrose 5 %-0.45 % sodium chloride infusion ( Intravenous New Bag/Given 07/02/17 0107)  diphenhydrAMINE (BENADRYL) injection 25 mg (25 mg Intravenous Given 07/02/17 0606)  HYDROmorphone (DILAUDID) injection 2 mg (0 mg Intravenous Not Given 07/02/17 0345)    Or  HYDROmorphone (DILAUDID) injection 2 mg ( Subcutaneous See Alternative 07/02/17 0345)  heparin lock flush 100 unit/mL (not administered)  HYDROmorphone (DILAUDID) injection 0.5 mg (0.5 mg Subcutaneous Given 07/02/17 0035)  ondansetron (ZOFRAN-ODT) disintegrating tablet 4 mg (4 mg Oral Given 07/02/17 0035)  ketorolac (TORADOL) 30 MG/ML injection 30 mg (30 mg Intravenous Given 07/02/17 0107)  promethazine (PHENERGAN) injection 25 mg (25 mg Intravenous Given 07/02/17 0210)  HYDROmorphone (DILAUDID) injection 2 mg (2 mg Intravenous Given 07/02/17 0106)    Or  HYDROmorphone (DILAUDID) injection 2 mg ( Subcutaneous See Alternative 07/02/17 0106)  HYDROmorphone (DILAUDID) injection 2 mg (2 mg Intravenous Given 07/02/17 0210)    Or  HYDROmorphone (DILAUDID) injection 2 mg ( Subcutaneous See Alternative 07/02/17 0210)  HYDROmorphone (DILAUDID)  injection 2 mg (2 mg Intravenous Given 07/02/17 0424)    Or  HYDROmorphone (DILAUDID) injection 2 mg (  Subcutaneous See Alternative 07/02/17 0424)  HYDROmorphone (DILAUDID) injection 2 mg (2 mg Intravenous Given 07/02/17 0606)  HYDROmorphone (DILAUDID) injection 1 mg (1 mg Intravenous Given 07/02/17 0800)    Mobility walks

## 2017-07-02 NOTE — ED Triage Notes (Signed)
Pt presents from home c/o generalized pain unchanged by her Oxycodone. Given 100 mcg Fentanyl en route through a 24 g PIV placed by EMS. VSS: 120/72, RR 16, HR 100. Pain 9/10.

## 2017-07-02 NOTE — H&P (Signed)
[name] is an 27 y.o. female.   Chief Complaint: Pain in back and legs  HPI: This is a 27 year old female with known history of sickle cell disease admitted with sickle cell painful crisis. Patient has had 6 mg of Dilaudid IV in the ER but not getting relief. Symptoms have been going on for the last 3 days after some upper respiratory tract infection. It is consistent with her typical sickle cell crisis. She has tried home medication again not getting any relief. Patient has had prior history of drug abuse but denied using any medications or drugs this time around. No fever or chills no nausea vomiting or diarrhea.  Past Medical History:  Diagnosis Date  . Blood transfusion    "lots"  . Blood transfusion without reported diagnosis   . Chronic back pain    "very severe; have knot in my back; from tight muscle; take RX and exercise for it"  . Depression 01/06/2011  . Exertional dyspnea    "sometimes"  . Genital HSV   . GERD (gastroesophageal reflux disease) 02/17/2011  . Migraines 11/08/11   "@ least twice/month"  . 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  . Mood swings 11/08/11   "I go back and forth; real bad"  . Sickle cell anemia ([name])   . Sickle cell anemia with crisis ([place])   . Thrombocytosis ([name]) 11/09/2015  . Trichotillomania    h/o    Past Surgical History:  Procedure Laterality Date  . CHOLECYSTECTOMY  05/2010  . DILATION AND CURETTAGE OF UTERUS  02/20/11   S/P miscarriage  . IR GENERIC HISTORICAL  12/23/2015   IR FLUORO GUIDE CV LINE RIGHT 12/23/2015 Jacqulynn Cadet, MD [facility]-INTERV RAD  . IR GENERIC HISTORICAL  12/23/2015   IR US GUIDE VASC ACCESS RIGHT 12/23/2015 Jacqulynn Cadet, MD [facility]    Family History  Problem Relation Age of Onset  . Sickle cell trait Mother   . Sickle cell trait Father   . Diabetes Maternal Grandmother   . Diabetes Paternal Grandmother   . Hypertension Paternal Grandmother   . Diabetes Maternal Grandfather    Social  History:  reports that she quit smoking about 4 years ago. Her smoking use included cigarettes. She has a 0.25 pack-year smoking history. she has never used smokeless tobacco. She reports that she does not drink alcohol or use drugs.  Allergies:  Allergies  Allergen Reactions  . Food Hives and Other (See Comments)    Pt is allergic to carrots.    . Latex Rash     (Not in a hospital admission)  Results for orders placed or performed during the hospital encounter of 07/01/17 (from the past 48 hour(s))  Comprehensive metabolic panel     Status: Abnormal   Collection Time: 07/02/17 12:24 AM  Result Value Ref Range   Sodium 136 135 - 145 mmol/L   Potassium 3.6 3.5 - 5.1 mmol/L   Chloride 100 (L) 101 - 111 mmol/L   CO2 26 22 - 32 mmol/L   Glucose, Bld 114 (H) 65 - 99 mg/dL   BUN 9 6 - 20 mg/dL   Creatinine, Ser 0.79 0.44 - 1.00 mg/dL   Calcium 8.7 (L) 8.9 - 10.3 mg/dL   Total Protein 7.3 6.5 - 8.1 g/dL   Albumin 4.1 3.5 - 5.0 g/dL   AST 43 (H) 15 - 41 U/L   ALT 34 14 - 54 U/L   Alkaline Phosphatase 77 38 - 126 U/L  Total Bilirubin 4.5 (H) 0.3 - 1.2 mg/dL    Comment: RESULTS CONFIRMED BY MANUAL DILUTION   GFR calc non Af Amer >60 >60 mL/min   GFR calc Af Amer >60 >60 mL/min    Comment: (NOTE) The eGFR has been calculated using the CKD EPI equation. This calculation has not been validated in all clinical situations. eGFR's persistently <60 mL/min signify possible Chronic Kidney Disease.    Anion gap 10 5 - 15    Comment: Performed at [facility], [place] [address], [place], [place] [address]  CBC with Differential     Status: Abnormal   Collection Time: 07/02/17 12:24 AM  Result Value Ref Range   WBC 28.7 (H) 4.0 - 10.5 K/uL   RBC 2.54 (L) 3.87 - 5.11 MIL/uL   Hemoglobin 7.4 (L) 12.0 - 15.0 g/dL   HCT 21.5 (L) 36.0 - 46.0 %   MCV 84.6 78.0 - 100.0 fL   MCH 29.1 26.0 - 34.0 pg   MCHC 34.4 30.0 - 36.0 g/dL   RDW 22.2 (H) 11.5 - 15.5 %   Platelets 480 (H)  150 - 400 K/uL   Neutrophils Relative % 80 %   Lymphocytes Relative 10 %   Monocytes Relative 9 %   Eosinophils Relative 1 %   Basophils Relative 0 %   Band Neutrophils 0 %   Metamyelocytes Relative 0 %   Myelocytes 0 %   Promyelocytes Absolute 0 %   Blasts 0 %   nRBC 3 (H) 0 /100 WBC   Other 0 %   Neutro Abs 22.9 (H) 1.7 - 7.7 K/uL   Lymphs Abs 2.9 0.7 - 4.0 K/uL   Monocytes Absolute 2.6 (H) 0.1 - 1.0 K/uL   Eosinophils Absolute 0.3 0.0 - 0.7 K/uL   Basophils Absolute 0.0 0.0 - 0.1 K/uL   RBC Morphology RARE NRBCs     Comment: POLYCHROMASIA PRESENT TARGET CELLS Sickle cells present    WBC Morphology WHITE COUNT CONFIRMED ON SMEAR     Comment: MILD LEFT SHIFT (1-5% METAS, OCC MYELO, OCC BANDS) Performed at [facility] [address] [place], [place] [address]   Reticulocytes     Status: Abnormal   Collection Time: 07/02/17 12:24 AM  Result Value Ref Range   Retic Ct Pct >23.0 (H) 0.4 - 3.1 %   RBC. 2.54 (L) 3.87 - 5.11 MIL/uL   Retic Count, Absolute NOT CALCULATED 19.0 - 186.0 K/uL    Comment: Performed at [facility], [place] [address] [place], [place] [address]  I-Stat beta hCG blood, ED     Status: None   Collection Time: 07/02/17 12:35 AM  Result Value Ref Range   I-stat hCG, quantitative <5.0 <5 mIU/mL   Comment 3            Comment:   GEST. AGE      CONC.  (mIU/mL)   <=1 WEEK        5 - 50     2 WEEKS       50 - 500     3 WEEKS       100 - 10,000     4 WEEKS     1,000 - 30,000        FEMALE AND NON-PREGNANT FEMALE:     LESS THAN 5 mIU/mL    *Note: Due to a large number of results and/or encounters for the requested time period, some results have not been displayed.  A complete set of results can be found in Results Review.   No results found.  Review of Systems  Constitutional: Positive for malaise/fatigue.  HENT: Negative.   Eyes: Negative.   Respiratory: Positive for cough.   Cardiovascular: Positive for leg  swelling. Negative for palpitations.  Gastrointestinal: Negative.   Genitourinary: Negative.   Musculoskeletal: Positive for back pain, joint pain and myalgias.  Skin: Negative.   Neurological: Negative.   Endo/Heme/Allergies: Negative.   Psychiatric/Behavioral: Negative.     Blood pressure (!) 100/51, pulse (!) 105, temperature 100 F (37.8 C), temperature source Oral, resp. rate (!) 30, height _0  (1.549 m), weight 93 kg (205 lb), SpO2 100 %, not currently breastfeeding. Physical Exam  Constitutional: She is oriented to person, place, and time. She appears well-developed and well-nourished.  HENT:  Head: Normocephalic and atraumatic.  Eyes: Conjunctivae are normal. Pupils are equal, round, and reactive to light.  Neck: Normal range of motion. Neck supple.  Cardiovascular: Normal rate, regular rhythm and normal heart sounds.  Respiratory: Effort normal and breath sounds normal.  GI: Soft. Bowel sounds are normal.  Musculoskeletal: Normal range of motion.  Neurological: She is alert and oriented to person, place, and time. She has normal reflexes.  Skin: Skin is warm and dry.  Psychiatric: She has a normal mood and affect.     Assessment/Plan A 27 year old female being admitted with sickle cell painful crisis  #1 sickle cell painful crisis: Patient will be initiated on Dilaudid PCA with Toradol and IV fluids. Pain will be assessed accordingly. Adjustment of medications will be done.  #2 sickle cell anemia: Patient's hemoglobin appears to be close to her baseline. We will check reticulocyte count also. 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  Continue with Xarelto  #5 chronic pain syndrome: Continue her MS Contin.    Barbette Merino, MD 07/02/2017, 7:25 AM

## 2017-07-03 DIAGNOSIS — D638 Anemia in other chronic diseases classified elsewhere: Secondary | ICD-10-CM

## 2017-07-03 DIAGNOSIS — T402X5A Adverse effect of other opioids, initial encounter: Secondary | ICD-10-CM

## 2017-07-03 DIAGNOSIS — G894 Chronic pain syndrome: Secondary | ICD-10-CM

## 2017-07-03 DIAGNOSIS — R208 Other disturbances of skin sensation: Secondary | ICD-10-CM

## 2017-07-03 DIAGNOSIS — D72829 Elevated white blood cell count, unspecified: Secondary | ICD-10-CM

## 2017-07-03 LAB — COMPREHENSIVE METABOLIC PANEL
ALBUMIN: 3.3 g/dL — AB (ref 3.5–5.0)
ALT: 36 U/L (ref 14–54)
ANION GAP: 7 (ref 5–15)
AST: 34 U/L (ref 15–41)
Alkaline Phosphatase: 97 U/L (ref 38–126)
BUN: 6 mg/dL (ref 6–20)
CHLORIDE: 105 mmol/L (ref 101–111)
CO2: 26 mmol/L (ref 22–32)
Calcium: 8.3 mg/dL — ABNORMAL LOW (ref 8.9–10.3)
Creatinine, Ser: 0.63 mg/dL (ref 0.44–1.00)
GFR calc Af Amer: >60 mL/min (ref >60)
GFR calc non Af Amer: >60 mL/min (ref >60)
GLUCOSE: 127 mg/dL — AB (ref 65–99)
POTASSIUM: 3.6 mmol/L (ref 3.5–5.1)
SODIUM: 138 mmol/L (ref 135–145)
Total Bilirubin: 3.4 mg/dL — ABNORMAL HIGH (ref 0.3–1.2)
Total Protein: 6.3 g/dL — ABNORMAL LOW (ref 6.5–8.1)

## 2017-07-03 LAB — RESPIRATORY PANEL BY PCR
ADENOVIRUS-RVPPCR: NOT DETECTED
Bordetella pertussis: NOT DETECTED
CORONAVIRUS 229E-RVPPCR: NOT DETECTED
CORONAVIRUS HKU1-RVPPCR: NOT DETECTED
CORONAVIRUS OC43-RVPPCR: NOT DETECTED
Chlamydophila pneumoniae: NOT DETECTED
Coronavirus NL63: NOT DETECTED
Influenza A: NOT DETECTED
Influenza B: NOT DETECTED
MYCOPLASMA PNEUMONIAE-RVPPCR: NOT DETECTED
Metapneumovirus: NOT DETECTED
PARAINFLUENZA VIRUS 4-RVPPCR: NOT DETECTED
Parainfluenza Virus 1: NOT DETECTED
Parainfluenza Virus 2: NOT DETECTED
Parainfluenza Virus 3: NOT DETECTED
Respiratory Syncytial Virus: NOT DETECTED
Rhinovirus / Enterovirus: NOT DETECTED

## 2017-07-03 LAB — CBC WITH DIFFERENTIAL/PLATELET
BASOS PCT: 0 %
Basophils Absolute: 0 10*3/uL (ref 0.0–0.1)
EOS PCT: 0 %
Eosinophils Absolute: 0 10*3/uL (ref 0.0–0.7)
HCT: 18.5 % — ABNORMAL LOW (ref 36.0–46.0)
HEMOGLOBIN: 6.3 g/dL — AB (ref 12.0–15.0)
LYMPHS PCT: 5 %
Lymphs Abs: 1.2 10*3/uL (ref 0.7–4.0)
MCH: 28.6 pg (ref 26.0–34.0)
MCHC: 34.1 g/dL (ref 30.0–36.0)
MCV: 84.1 fL (ref 78.0–100.0)
MONOS PCT: 10 %
Monocytes Absolute: 2.3 10*3/uL — ABNORMAL HIGH (ref 0.1–1.0)
NEUTROS ABS: 19.9 10*3/uL — AB (ref 1.7–7.7)
NRBC: 4 /100{WBCs} — AB
Neutrophils Relative %: 85 %
PLATELETS: 369 10*3/uL (ref 150–400)
RBC: 2.2 MIL/uL — ABNORMAL LOW (ref 3.87–5.11)
RDW: 20.6 % — ABNORMAL HIGH (ref 11.5–15.5)
WBC: 23.4 10*3/uL — ABNORMAL HIGH (ref 4.0–10.5)

## 2017-07-03 LAB — HIV ANTIBODY (ROUTINE TESTING W REFLEX): HIV Screen 4th Generation wRfx: NONREACTIVE

## 2017-07-03 LAB — LACTATE DEHYDROGENASE: LDH: 621 U/L — AB (ref 98–192)

## 2017-07-03 LAB — RETICULOCYTES
RBC.: 2.13 MIL/uL — ABNORMAL LOW (ref 3.87–5.11)
RETIC COUNT ABSOLUTE: 432.4 10*3/uL — AB (ref 19.0–186.0)
Retic Ct Pct: 20.3 % — ABNORMAL HIGH (ref 0.4–3.1)

## 2017-07-03 LAB — PREPARE RBC (CROSSMATCH)

## 2017-07-03 MED ORDER — SODIUM CHLORIDE 0.9 % IV SOLN: Freq: Once | INTRAVENOUS | Status: DC

## 2017-07-03 MED ORDER — ENSURE ENLIVE PO LIQD
237.0000 mL | Freq: Two times a day (BID) | ORAL | Status: DC | PRN
  Administered 2017-07-03: 237 mL via ORAL
  Filled 2017-07-03: qty 237

## 2017-07-03 MED ORDER — L-GLUTAMINE ORAL POWDER
15.0000 g | PACK | Freq: Two times a day (BID) | ORAL | Status: DC
  Administered 2017-07-03 – 2017-07-07 (×6): 15 g via ORAL
  Filled 2017-07-03 (×8): qty 3

## 2017-07-03 MED ORDER — HYDROMORPHONE HCL 2 MG/ML IJ SOLN
2.0000 mg | Freq: Once | INTRAMUSCULAR | Status: AC
  Administered 2017-07-03: 2 mg via INTRAVENOUS

## 2017-07-03 MED ORDER — SODIUM CHLORIDE 0.9% FLUSH: 10.0000 mL | INTRAVENOUS | Status: DC | PRN

## 2017-07-03 NOTE — Progress Notes (Signed)
Dr. Zigmund Daniel in to speak with patient about  the importance of taking her medications as ordered by her MD to help her with her sickle cell disease. Pt states she has not been taking them as ordered.

## 2017-07-03 NOTE — Progress Notes (Signed)
Patient ID: [name], female   DOB: 07-Dec-1990, 27 y.o.   MRN: [id number]  Respiratory panel negative for all viruses tested including influenza. Droplet precautions discontinued.  MATTHEWS,MICHELLE A.

## 2017-07-03 NOTE — Progress Notes (Signed)
SICKLE CELL SERVICE PROGRESS NOTE  [name] HFW:[id number] DOB: [birth year] DOA: 07/01/2017 PCP: Scot Jun, FNP  Assessment/Plan: Principal Problem:   Sickle cell pain crisis ([place]) Active Problems:   Major depression, chronic   Sickle cell crisis ([name])   Anemia of chronic disease   Leukocytosis  1. Hb SS with crisis: It was demonstrated that higher doses of opiates were ineffective in alleviating her pain when she received a bolus of IV Dilaudid and shortly after had increase in pain. This supports the principle of hyperalgesia in this patient. I will not increase her pain mediations at this time and if she continues to have increased pain, will lower the dose of medication and increase the frequency. Continue Toradol. Will also start L-glutamine to assist in decreasing oxidative stress. Although transfusion is not indicated for treatment of pain, the pain is resulting in shallow breathing which places her at risk of atelectasis and risk of Acute Chest Syndrome. I will consider transfusing 1 unit RBC to aid in breaking the cycle as she is now having hypoxia.  2. Anemia of Chronic Disease: Hb below her baseline of 7.5 - 8 g/dL.  She has had only 1 transfusion last year and 7 transfusion sin 2017. Will check LDH and Hb Electrophoresis. If hypoxia and severe pain continues will transfuse 1 unit RBC's to break cycle that places patient at risk of Acute Chest Syndrome. .  3. Chronic Pain Syndrome: Pt uses MS Contin on an average of  3 x week and Oxycodone an average of 2 x week. She is currently on MS Contin and will start Neurontin.  4. Sickle Cell Disease: Pt is not adherent to Hydrea and thus has a more severe expression of her disease symptoms including pain. I have had several discussions with Ms. Dubreuil about how we can assist her with adherence to her medications however she has not identified any barriers with which we can assist. Will ask SW to see patient to help to identify  barriers to her adherence.  5. Fever: No clinical sequela of infection. Fever can be a feature of sickle cell crisis. Awaiting results of respiratory panel. Continue droplet precautions until results are received.    Code Status: Full Code Family Communication: N/A Disposition Plan: Not yet ready for discharge  [place].  Pager [phone]. If 7PM-7AM, please contact night-coverage.  07/03/2017, 1:00 PM  LOS: 1 day   Interim History: Pt crying in pain. She reports pain 10/10 and localized to jaws, upper and lower back, arms, legs, sternum and ribs. Pt has used 30.08 mg of IV Dilaudid in the last 24 hours with 89/76:demans/deliveries. Pt received a bolus dose of 2 mg Dilaudid and then self delivered a bolus dose of 0.5 mg of Dilaudid and reported sever pain to the RLE within 2-3 minutes of receiving the medication. This demonstrates that increased doses of opiates actually result in a paradoxical effect making her pain worse and offers no relief.   Consultants:  None  Procedures:  None  Antibiotics:  None    Objective: Vitals:   07/03/17 0551 07/03/17 0800 07/03/17 0940 07/03/17 1235  BP:   112/69   Pulse:   (!) 113   Resp: 17 (!) 22 19 10   Temp:   98.6 F (37 C)   TempSrc:   Oral   SpO2: 100% 100% 100% (!) 1%  Weight:      Height:       Weight change: 1.013 kg (2 lb 3.7  oz)  Intake/Output Summary (Last 24 hours) at 07/03/2017 1300 Last data filed at 07/03/2017 0900 Gross per 24 hour  Intake 1091.25 ml  Output 2700 ml  Net -1608.75 ml      Physical Exam General: Alert, awake, oriented x3, in severe distress due to pain.  HEENT: [place]/AT PEERL, EOMI, anicteric. Neck: Trachea midline,  no masses, no thyromegal,y no JVD, no carotid bruit OROPHARYNX:  Moist, No exudate/ erythema/lesions.  Heart: Regular rate and rhythm, without murmurs, rubs, gallops, PMI non-displaced, no heaves or thrills on palpation.  Lungs: Clear to auscultation, no wheezing or rhonchi  noted. No increased vocal fremitus resonant to percussion  Abdomen: Soft, nontender, nondistended, positive bowel sounds, no masses no hepatosplenomegaly noted.  Neuro: No focal neurological deficits noted cranial nerves II through XII grossly intact.  Unable to test strength due to pain. . Musculoskeletal: No warmth swelling or erythema around joints, no spinal tenderness noted. Psychiatric: Patient alert and oriented x 3, good insight and cognition, good recent to remote recall.    Data Reviewed: Basic Metabolic Panel: Recent Labs  Lab 07/02/17 0024 07/03/17 0550  NA 136 138  K 3.6 3.6  CL 100* 105  CO2 26 26  GLUCOSE 114* 127*  BUN 9 6  CREATININE 0.79 0.63  CALCIUM 8.7* 8.3*   Liver Function Tests: Recent Labs  Lab 07/02/17 0024 07/03/17 0550  AST 43* 34  ALT 34 36  ALKPHOS 77 97  BILITOT 4.5* 3.4*  PROT 7.3 6.3*  ALBUMIN 4.1 3.3*   No results for input(s): LIPASE, AMYLASE in the last 168 hours. No results for input(s): AMMONIA in the last 168 hours. CBC: Recent Labs  Lab 07/02/17 0024 07/03/17 0550  WBC 28.7* 23.4*  NEUTROABS 22.9* 19.9*  HGB 7.4* 6.3*  HCT 21.5* 18.5*  MCV 84.6 84.1  PLT 480* 369   Cardiac Enzymes: No results for input(s): CKTOTAL, CKMB, CKMBINDEX, TROPONINI in the last 168 hours. BNP (last 3 results) No results for input(s): BNP in the last 8760 hours.  ProBNP (last 3 results) No results for input(s): PROBNP in the last 8760 hours.  CBG: No results for input(s): GLUCAP in the last 168 hours.  No results found for this or any previous visit (from the past 240 hour(s)).   Studies: No results found.  Scheduled Meds: . HYDROmorphone   Intravenous Q4H  .  HYDROmorphone (DILAUDID) injection  2 mg Intravenous Once  . hydroxyurea  1,000 mg Oral QODAY  . hydroxyurea  500 mg Oral QODAY  . ketorolac  30 mg Intravenous Q6H  . L-glutamine  15 g Oral BID  . morphine  30 mg Oral Q12H  . multivitamin with minerals  1 tablet Oral Daily   . rivaroxaban  20 mg Oral Q supper  . senna-docusate  1 tablet Oral BID  . vitamin B-12  250 mcg Oral Daily   Continuous Infusions: . dextrose 5 % and 0.45% NaCl 10 mL/hr at 07/03/17 1118  . diphenhydrAMINE      Principal Problem:   Sickle cell pain crisis ([name]) Active Problems:   Major depression, chronic   Sickle cell crisis ([name])   Anemia of chronic disease   Leukocytosis    In excess of 45 minutes spent during this visit. Greater than 50% involved face to face contact with the patient for assessment, counseling and coordination of care.

## 2017-07-03 NOTE — Progress Notes (Signed)
Nutrition Brief Note  Patient identified on the Malnutrition Screening Tool (MST) Report  Pt with overall weight gain. Pt has been ordered Ensure supplements, changed to PRN (only if pt is not eating).  Wt Readings from Last 15 Encounters:  07/03/17 215 lb 6.2 oz (97.7 kg)  06/11/17 202 lb (91.6 kg)  06/01/17 213 lb 13.5 oz (97 kg)  05/05/17 209 lb 3.5 oz (94.9 kg)  04/27/17 209 lb (94.8 kg)  04/01/17 204 lb 12.9 oz (92.9 kg)  03/04/17 214 lb 14.4 oz (97.5 kg)  02/02/17 201 lb (91.2 kg)  01/10/17 200 lb (90.7 kg)  12/14/16 200 lb (90.7 kg)  12/05/16 200 lb (90.7 kg)  11/26/16 200 lb (90.7 kg)  11/24/16 200 lb (90.7 kg)  11/10/16 213 lb 3 oz (96.7 kg)  10/31/16 202 lb (91.6 kg)    Body mass index is 40.7 kg/m. Patient meets criteria for morbid obesity based on current BMI.   Current diet order is regular.Labs and medications reviewed.   No nutrition interventions warranted at this time. If nutrition issues arise, please consult RD.   Nancy Bibles, MS, RD, [place] Dietitian Pager: [phone] After Hours Pager: [phone]

## 2017-07-03 NOTE — Progress Notes (Signed)
CRITICAL VALUE ALERT  Critical Value: 6.3 Hgb  Date & Time Notied:  07/03/17   5041 Provider Notified:  Internal Medicine  Orders Received/Actions taken: awaiting MD

## 2017-07-04 ENCOUNTER — Encounter (HOSPITAL_COMMUNITY): Payer: Self-pay | Admitting: Radiology

## 2017-07-04 ENCOUNTER — Inpatient Hospital Stay (HOSPITAL_COMMUNITY): Payer: Medicare Other

## 2017-07-04 DIAGNOSIS — Z7901 Long term (current) use of anticoagulants: Secondary | ICD-10-CM

## 2017-07-04 DIAGNOSIS — R1011 Right upper quadrant pain: Secondary | ICD-10-CM

## 2017-07-04 LAB — CBC WITH DIFFERENTIAL/PLATELET
BASOS PCT: 0 %
Band Neutrophils: 0 %
Basophils Absolute: 0 10*3/uL (ref 0.0–0.1)
Blasts: 0 %
EOS PCT: 1 %
Eosinophils Absolute: 0.2 10*3/uL (ref 0.0–0.7)
HEMATOCRIT: 16.2 % — AB (ref 36.0–46.0)
Hemoglobin: 5.7 g/dL — CL (ref 12.0–15.0)
LYMPHS ABS: 1.5 10*3/uL (ref 0.7–4.0)
Lymphocytes Relative: 7 %
MCH: 28.8 pg (ref 26.0–34.0)
MCHC: 35.2 g/dL (ref 30.0–36.0)
MCV: 81.8 fL (ref 78.0–100.0)
MONOS PCT: 5 %
MYELOCYTES: 0 %
Metamyelocytes Relative: 0 %
Monocytes Absolute: 1.1 10*3/uL — ABNORMAL HIGH (ref 0.1–1.0)
NEUTROS ABS: 18.2 10*3/uL — AB (ref 1.7–7.7)
NEUTROS PCT: 87 %
NRBC: 4 /100{WBCs} — AB
Other: 0 %
PLATELETS: 378 10*3/uL (ref 150–400)
Promyelocytes Absolute: 0 %
RBC: 1.98 MIL/uL — ABNORMAL LOW (ref 3.87–5.11)
RDW: 19.6 % — AB (ref 11.5–15.5)
WBC: 21 10*3/uL — ABNORMAL HIGH (ref 4.0–10.5)

## 2017-07-04 LAB — BASIC METABOLIC PANEL WITH GFR
Anion gap: 11 (ref 5–15)
BUN: 8 mg/dL (ref 6–20)
CO2: 23 mmol/L (ref 22–32)
Calcium: 8.3 mg/dL — ABNORMAL LOW (ref 8.9–10.3)
Chloride: 100 mmol/L — ABNORMAL LOW (ref 101–111)
Creatinine, Ser: 0.66 mg/dL (ref 0.44–1.00)
GFR calc Af Amer: >60 mL/min
GFR calc non Af Amer: >60 mL/min
Glucose, Bld: 112 mg/dL — ABNORMAL HIGH (ref 65–99)
Potassium: 3.6 mmol/L (ref 3.5–5.1)
Sodium: 134 mmol/L — ABNORMAL LOW (ref 135–145)

## 2017-07-04 LAB — HEPATIC FUNCTION PANEL
ALT: 41 U/L (ref 14–54)
AST: 29 U/L (ref 15–41)
Albumin: 3.4 g/dL — ABNORMAL LOW (ref 3.5–5.0)
Alkaline Phosphatase: 131 U/L — ABNORMAL HIGH (ref 38–126)
Bilirubin, Direct: 2 mg/dL — ABNORMAL HIGH (ref 0.1–0.5)
Indirect Bilirubin: 2.5 mg/dL — ABNORMAL HIGH (ref 0.3–0.9)
Total Bilirubin: 4.5 mg/dL — ABNORMAL HIGH (ref 0.3–1.2)
Total Protein: 6.4 g/dL — ABNORMAL LOW (ref 6.5–8.1)

## 2017-07-04 LAB — PREPARE RBC (CROSSMATCH)

## 2017-07-04 LAB — RETICULOCYTES
RBC.: 1.98 MIL/uL — ABNORMAL LOW (ref 3.87–5.11)
RETIC COUNT ABSOLUTE: 394 10*3/uL — AB (ref 19.0–186.0)
Retic Ct Pct: 19.9 % — ABNORMAL HIGH (ref 0.4–3.1)

## 2017-07-04 LAB — TROPONIN I: Troponin I: <0.03 ng/mL (ref <0.03)

## 2017-07-04 MED ORDER — IOPAMIDOL (ISOVUE-300) INJECTION 61%
INTRAVENOUS | Status: AC
  Administered 2017-07-04: 100 mL via INTRAVENOUS
  Filled 2017-07-04: qty 100

## 2017-07-04 MED ORDER — HEPARIN SOD (PORK) LOCK FLUSH 100 UNIT/ML IV SOLN: 500.0000 [IU] | Freq: Once | INTRAVENOUS | Status: DC

## 2017-07-04 MED ORDER — SODIUM CHLORIDE 0.9 % IV SOLN: Freq: Once | INTRAVENOUS | Status: DC

## 2017-07-04 MED ORDER — ACETAMINOPHEN 325 MG PO TABS
650.0000 mg | ORAL_TABLET | Freq: Once | ORAL | Status: AC
Start: 2017-07-04 — End: 2017-07-04
  Administered 2017-07-04: 650 mg via ORAL
  Filled 2017-07-04: qty 2

## 2017-07-04 MED ORDER — OXYCODONE HCL 5 MG PO TABS
5.0000 mg | ORAL_TABLET | Freq: Once | ORAL | Status: AC
  Administered 2017-07-04: 5 mg via ORAL
  Filled 2017-07-04: qty 1

## 2017-07-04 NOTE — Progress Notes (Signed)
PRBC are infusing. She has allowed the PCA to be off so the blood can be administered. 2 attempts to start a PIV. PCA is turn off . Prbc infusing via PAC

## 2017-07-04 NOTE — Progress Notes (Addendum)
SICKLE CELL SERVICE PROGRESS NOTE  [name] TZG:[id number] DOB: 1991-05-12 DOA: 07/01/2017 PCP: Scot Jun, FNP  Assessment/Plan: Principal Problem:   Sickle cell pain crisis ([place]) Active Problems:   Major depression, chronic   Sickle cell crisis ([name])   Anemia of chronic disease   Leukocytosis  1. RUQ pain and tenderness: Pt is tachycardia and exquisitely painful in the RUQ. She is S/P cholecystectomy. She has no clinical signs of hemolysis and I currently have no labs available from today to evaluate her blood volume. So with a hgh index of suspicion will transfuse 1 unit of RBC. Obtain CBC with diff, reticulocytes, metabolic and hepatic panel. Will also obtain CT abdomen with contrast. Increase IVF until blood available for transfusio 2. Hb SS with Crisis: Continue PCA at current dose and also continue Toradol. She has demonstrated hyperalgesia so will not increase pain medications.  3. Anemia of Chronic Disease: Awaiting lab results. 4. Chronic Pain Syndrome: Continue MS Contin. Pt has demonstrated hyperalgesia.  5. Chronic Anticoagulation: Continue Xarelto  Code Status: Full Code Family Communication: N/A Disposition Plan: Not yet ready for discharge  [place].  Pager [phone]. If 7PM-7AM, please contact night-coverage.  07/04/2017, 11:22 AM  LOS: 2 days   Interim History: Received call from nurse at 10:18 am reporting that patient reporting difficulty breathing and with HR of 117, BP 128/81 and oxygen saturations of 100%. I asked nurse to obtain a 12-lead EKG and troponin levels which showed sinus tachycardia and non-specific S-T/T-wave changes. When I arrived to evaluate the patient, she actually reported pain in the RUQ which increased significantly when she tried to take a deep breath. She described the pain as an intense dull and intermittently sharp pain which was non-radiating. She also c/o pain consistent with sickle cell crisis in her legs and RUE  at an intensity of 9/10. Pt in severe distress associated with RUQ pain.    Consultants:  None  Procedures:  None  Antibiotics:  None    Objective: Vitals:   07/04/17 0505 07/04/17 0802 07/04/17 1000 07/04/17 1033  BP:   128/81   Pulse:   (!) 122 (!) 103  Resp: 13 13    Temp:      TempSrc:      SpO2: 94%  97%   Weight:      Height:       Weight change: 1.4 kg (3 lb 1.4 oz)  Intake/Output Summary (Last 24 hours) at 07/04/2017 1122 Last data filed at 07/04/2017 0600 Gross per 24 hour  Intake 750 ml  Output 1800 ml  Net -1050 ml     Physical Exam General: Alert, awake, oriented x 3, in extreme distress due to pain.  HEENT: [place]/AT PEERL, EOMI, anicteric Neck: Trachea midline,  no masses, no thyromegal,y no JVD, no carotid bruit OROPHARYNX:  Moist, No exudate/ erythema/lesions.  Heart: Tachycardia with regular rhythm, without murmurs, rubs, gallops, PMI non-displaced, no heaves or thrills on palpation.  Lungs: Examination limited due to pain and shallow breathing. Clear to auscultation, no wheezing or rhonchi noted.  Abdomen: Mildly distended. Severe RUQ tenderness. There is voluntary guarding but no rebound and abdomen is non-surgical.  Positive bowel sounds.  Neuro: No focal neurological deficits noted cranial nerves II through XII grossly intact. Unable to test strength due to pain but patient able to move all extremities and move independently in bed.   Musculoskeletal: No warmth swelling or erythema around joints, no spinal tenderness noted. Psychiatric: Patient alert and  oriented x3, good insight and cognition, good recent to remote recall.    Data Reviewed: Basic Metabolic Panel: Recent Labs  Lab 07/02/17 0024 07/03/17 0550  NA 136 138  K 3.6 3.6  CL 100* 105  CO2 26 26  GLUCOSE 114* 127*  BUN 9 6  CREATININE 0.79 0.63  CALCIUM 8.7* 8.3*   Liver Function Tests: Recent Labs  Lab 07/02/17 0024 07/03/17 0550  AST 43* 34  ALT 34 36  ALKPHOS 77 97   BILITOT 4.5* 3.4*  PROT 7.3 6.3*  ALBUMIN 4.1 3.3*   No results for input(s): LIPASE, AMYLASE in the last 168 hours. No results for input(s): AMMONIA in the last 168 hours. CBC: Recent Labs  Lab 07/02/17 0024 07/03/17 0550  WBC 28.7* 23.4*  NEUTROABS 22.9* 19.9*  HGB 7.4* 6.3*  HCT 21.5* 18.5*  MCV 84.6 84.1  PLT 480* 369   Cardiac Enzymes: No results for input(s): CKTOTAL, CKMB, CKMBINDEX, TROPONINI in the last 168 hours. BNP (last 3 results) No results for input(s): BNP in the last 8760 hours.  ProBNP (last 3 results) No results for input(s): PROBNP in the last 8760 hours.  CBG: No results for input(s): GLUCAP in the last 168 hours.  Recent Results (from the past 240 hour(s))  Respiratory Panel by PCR     Status: None   Collection Time: 07/02/17  1:43 PM  Result Value Ref Range Status   Adenovirus NOT DETECTED NOT DETECTED Final   Coronavirus 229E NOT DETECTED NOT DETECTED Final   Coronavirus HKU1 NOT DETECTED NOT DETECTED Final   Coronavirus NL63 NOT DETECTED NOT DETECTED Final   Coronavirus OC43 NOT DETECTED NOT DETECTED Final   Metapneumovirus NOT DETECTED NOT DETECTED Final   Rhinovirus / Enterovirus NOT DETECTED NOT DETECTED Final   Influenza A NOT DETECTED NOT DETECTED Final   Influenza B NOT DETECTED NOT DETECTED Final   Parainfluenza Virus 1 NOT DETECTED NOT DETECTED Final   Parainfluenza Virus 2 NOT DETECTED NOT DETECTED Final   Parainfluenza Virus 3 NOT DETECTED NOT DETECTED Final   Parainfluenza Virus 4 NOT DETECTED NOT DETECTED Final   Respiratory Syncytial Virus NOT DETECTED NOT DETECTED Final   Bordetella pertussis NOT DETECTED NOT DETECTED Final   Chlamydophila pneumoniae NOT DETECTED NOT DETECTED Final   Mycoplasma pneumoniae NOT DETECTED NOT DETECTED Final    Comment: Performed at [facility], [address] [place], [place] [address]     Studies: No results found.  Scheduled Meds: . HYDROmorphone   Intravenous Q4H  . hydroxyurea   1,000 mg Oral QODAY  . hydroxyurea  500 mg Oral QODAY  . ketorolac  30 mg Intravenous Q6H  . L-glutamine  15 g Oral BID  . morphine  30 mg Oral Q12H  . multivitamin with minerals  1 tablet Oral Daily  . rivaroxaban  20 mg Oral Q supper  . senna-docusate  1 tablet Oral BID  . vitamin B-12  250 mcg Oral Daily   Continuous Infusions: . sodium chloride    . sodium chloride    . dextrose 5 % and 0.45% NaCl 10 mL/hr at 07/04/17 0749  . diphenhydrAMINE      Principal Problem:   Sickle cell pain crisis ([name]) Active Problems:   Major depression, chronic   Sickle cell crisis ([name])   Anemia of chronic disease   Leukocytosis    In excess of 60 minutes spent during this visit. Greater than 50% involved face to face contact with  the patient for assessment, counseling and coordination of care.    Addendum: Pt now with temp of 100.4. Hb 5.7 g/dL, WBC 21 and reticulocyte 19.9%. Will continue Hydrea as it's anti-adhesion value in this setting outweighs the risk of further anemia since reticulocytosis is robust. Awaiting hepatic and metabolic panel. Proceed with transfusion as we await CT abdomen,   Eiden Bagot A.

## 2017-07-04 NOTE — Progress Notes (Signed)
Patient ID: [name], female   DOB: January 18, 1991, 27 y.o.   MRN: [id number] FOCUSED EVALUATION:  [name] is s/p transfusionj of 1 unit RBC. She is much more composed than she was this morning but still c/o pain.   EXAMINATION: Vital Signs: Noted febrile with mild increase in HR but BP improved.  GEN: Pt A&O x 3. She is ill but non-toxic appearing.  Resp: She has no increased work of breathing and Oxygenation is normal on RA.  ABD: The abdomen is mildly distended but hte RUQ is markedly decreased since this morning.  EXT: No edema. No warmth. No Erythema.   A/P Clinical presentation raised suspicion for RUQ syndrome. Pt s/p transfusion 1 unit RBC. Hemodynamiclly improved and stable.  She does have a fever so will send urine for analysis. Since hemodynamically stable will observe without antibiotics. Treat fever symptomatically. Awaiting results of CT abdomen.   Decrease IVF to [facility].and measure strict I&O.   Stori Royse A.

## 2017-07-04 NOTE — Progress Notes (Signed)
Clarrissa has refused  to allow the blood transfusion. [name] has informed her of the risk for refusing and  The risk of death if she leaves AMA

## 2017-07-04 NOTE — Consult Note (Addendum)
   [facility] CM Inpatient Consult   07/04/2017  [name] 1990/08/08 [id number]    Stopped by patient's room earlier to discuss [place] Management program. However, [name] was sleeping soundly. Did not want to disturb.  [facility] Care Management RNCM attempted to reach [name] on multiple occassions for [place] Management engagement. However, [facility] RNCM was unable to reach [name]. Please see chart review tab then encounters for patient outreach details.  Left [facility] Care Management brochure at bedside with contact information.    Marthenia Rolling, MSN-Ed, RN,BSN [facility] Liaison [phone]

## 2017-07-04 NOTE — Progress Notes (Signed)
Patient ID: [name], female   DOB: 09-27-1990, 27 y.o.   MRN: [id number]    Called by nurse as patient refusing blood and CT scan. Pt initially agreed to treatment and risks and benefits were explained. Currently she is alert and oriented to time, person and place. She verbalizes understanding of the risks of refusing treatment up to and including death. However she states that if the PCA has to be stopped temporarily in order to administer blood and while the nurses are trying to establish another PIV site, she refuses any further treatment and will leave AMA. I have again explained the risks and benefits and she still refuses. She has decided to leave AMA.  Shin Lamour A.

## 2017-07-04 NOTE — Progress Notes (Signed)
[name]  Is resting with her eyes closed.  T 101.8 HR 119 r 16 BP 94/46 manual. Ob Dinamap BP was 84/38  1230 T 103.7 oral HR 122 R 16 O2 SAT 99 %. [name] has been notified.   [phone] [name] has c/o of rt upper mid trunk pain describing   That she   feels a "popping " feeling in her chest and that she can"t breath. [name]  Has been notified

## 2017-07-05 ENCOUNTER — Inpatient Hospital Stay (HOSPITAL_COMMUNITY): Payer: Medicare Other

## 2017-07-05 DIAGNOSIS — K769 Liver disease, unspecified: Secondary | ICD-10-CM

## 2017-07-05 DIAGNOSIS — R825 Elevated urine levels of drugs, medicaments and biological substances: Secondary | ICD-10-CM

## 2017-07-05 DIAGNOSIS — F11259 Opioid dependence with opioid-induced psychotic disorder, unspecified: Secondary | ICD-10-CM

## 2017-07-05 LAB — BASIC METABOLIC PANEL
Anion gap: 12 (ref 5–15)
BUN: 6 mg/dL (ref 6–20)
CHLORIDE: 102 mmol/L (ref 101–111)
CO2: 23 mmol/L (ref 22–32)
Calcium: 8.3 mg/dL — ABNORMAL LOW (ref 8.9–10.3)
Creatinine, Ser: 0.68 mg/dL (ref 0.44–1.00)
GFR calc non Af Amer: >60 mL/min (ref >60)
Glucose, Bld: 145 mg/dL — ABNORMAL HIGH (ref 65–99)
POTASSIUM: 3.2 mmol/L — AB (ref 3.5–5.1)
SODIUM: 137 mmol/L (ref 135–145)

## 2017-07-05 LAB — CBC WITH DIFFERENTIAL/PLATELET
BASOS PCT: 0 %
Basophils Absolute: 0 10*3/uL (ref 0.0–0.1)
EOS ABS: 0 10*3/uL (ref 0.0–0.7)
Eosinophils Relative: 0 %
HCT: 18.4 % — ABNORMAL LOW (ref 36.0–46.0)
HEMOGLOBIN: 6.4 g/dL — AB (ref 12.0–15.0)
LYMPHS PCT: 11 %
Lymphs Abs: 2.3 10*3/uL (ref 0.7–4.0)
MCH: 27.7 pg (ref 26.0–34.0)
MCHC: 34.8 g/dL (ref 30.0–36.0)
MCV: 79.7 fL (ref 78.0–100.0)
MONO ABS: 0.4 10*3/uL (ref 0.1–1.0)
MONOS PCT: 2 %
NEUTROS ABS: 17.9 10*3/uL — AB (ref 1.7–7.7)
NRBC: 3 /100{WBCs} — AB
Neutrophils Relative %: 87 %
Platelets: 404 10*3/uL — ABNORMAL HIGH (ref 150–400)
RBC: 2.31 MIL/uL — ABNORMAL LOW (ref 3.87–5.11)
RDW: 18.8 % — ABNORMAL HIGH (ref 11.5–15.5)
WBC: 20.6 10*3/uL — ABNORMAL HIGH (ref 4.0–10.5)

## 2017-07-05 LAB — HEPATIC FUNCTION PANEL
ALBUMIN: 3.2 g/dL — AB (ref 3.5–5.0)
ALK PHOS: 145 U/L — AB (ref 38–126)
ALT: 51 U/L (ref 14–54)
AST: 36 U/L (ref 15–41)
BILIRUBIN TOTAL: 4.4 mg/dL — AB (ref 0.3–1.2)
Bilirubin, Direct: 2 mg/dL — ABNORMAL HIGH (ref 0.1–0.5)
Indirect Bilirubin: 2.4 mg/dL — ABNORMAL HIGH (ref 0.3–0.9)
TOTAL PROTEIN: 6.6 g/dL (ref 6.5–8.1)

## 2017-07-05 LAB — URINALYSIS, ROUTINE W REFLEX MICROSCOPIC
Bilirubin Urine: NEGATIVE
GLUCOSE, UA: NEGATIVE mg/dL
HGB URINE DIPSTICK: NEGATIVE
KETONES UR: NEGATIVE mg/dL
NITRITE: NEGATIVE
PH: 6 (ref 5.0–8.0)
PROTEIN: NEGATIVE mg/dL
Specific Gravity, Urine: 1.012 (ref 1.005–1.030)

## 2017-07-05 LAB — RETICULOCYTES
RBC.: 2.31 MIL/uL — ABNORMAL LOW (ref 3.87–5.11)
Retic Count, Absolute: 388.1 10*3/uL — ABNORMAL HIGH (ref 19.0–186.0)
Retic Ct Pct: 16.8 % — ABNORMAL HIGH (ref 0.4–3.1)

## 2017-07-05 LAB — PREPARE RBC (CROSSMATCH)

## 2017-07-05 MED ORDER — HYDROMORPHONE 1 MG/ML IV SOLN
INTRAVENOUS | Status: DC
  Administered 2017-07-05: 7.31 mg via INTRAVENOUS
  Administered 2017-07-05: 7.45 mg via INTRAVENOUS
  Administered 2017-07-06: 10.35 mg via INTRAVENOUS
  Administered 2017-07-06: 6.72 mg via INTRAVENOUS
  Administered 2017-07-06: 10.35 mg via INTRAVENOUS
  Administered 2017-07-06: 10.08 mg via INTRAVENOUS
  Administered 2017-07-06 (×2): via INTRAVENOUS
  Filled 2017-07-05 (×3): qty 25

## 2017-07-05 MED ORDER — SODIUM CHLORIDE 0.9 % IV SOLN: Freq: Once | INTRAVENOUS | Status: DC

## 2017-07-05 NOTE — Care Management Important Message (Signed)
Important Message  Patient Details  Name: [name] MRN: [id number] Date of Birth: Aug 23, 1990   Medicare Important Message Given:  Yes    Kerin Salen 07/05/2017, 12:56 [place] Message  Patient Details  Name: [name] MRN: [id number] Date of Birth: [birth year]   Medicare Important Message Given:  Yes    Kerin Salen 07/05/2017, 12:56 PM

## 2017-07-05 NOTE — Progress Notes (Signed)
[name] is sleeping .Eyes are closed respiration are even and unlabored.

## 2017-07-05 NOTE — Progress Notes (Signed)
RN paged because pt wanted someone to come see her, stating "you can't tell how much pain (I have) without seeing me". Before talking with the pt, the NP spoke with the RN. Per the RN, she took the extra dose of Percocet that writer prescribed last pm. Writer has not heard anything else about the pt's pain since then and now it is 0315 hours. Per RN, the po dose was given because the pt was getting ready to have blood transfusion and wouldn't have PCA for awhile. After the Percocet, pt kept "putting off" the transfusion and still has not had the blood. Pt felt well enough to take a shower and have PCA off during this time. NP spoke with pt over the telephone. Pt stated she was setting the alarm on her phone to "push" the PCA every hour. NP explained that she was not using the max of the PCA settings. Pt replied that she can't push the button if she falls asleep. NP explained that if pt can sleep, she likely doesn't need more pain meds, it is not safe. NP explained that pt wouldn't receive a "push" of Dilaudid until she had shown max use of PCA. Pt not happy. NP informed pt she could discuss this with Dr. Zigmund Daniel in the am.  NP does not feel appropriate to give IV "push" dose to a pt who is not maxed out on her PCA, felt well enough to shower for an hour, and has been sleeping.  KJKG, NP [facility]

## 2017-07-05 NOTE — Progress Notes (Signed)
Patient has being pushing off to get the second unit of blood transfusion. Multiple failed attempts have been made to get  another IV site. Patient stated that she is in pain and would not allowed RN to hold off her PCA for the blood transfusion. Baltazar Najjar NP was notified about patient uncontrollable pain and percocet  5mg  was ordered and given. Patient was encouraged to get the blood transfusion started since she now have the pain medication. Patient kept holding off stating "she will let RN know when she is ready. Patient had a long shower even after advised by NT not to since patient have an IV portal cath. Patient refused and stay in the shower for about an hour. At about 0300 patient requested to see Baltazar Najjar NP stating the Percocet did not helped her pain and she needs 2 mg of dilaudid, and that  if kirby come and see the severity of her pain she will order what she needs. Baltazar Najjar NP responded via telephone and talked to patient on this phone. Patient continue to hold off on the blood transfusion.

## 2017-07-05 NOTE — Progress Notes (Addendum)
Pt has used 4.5 mg of Dilaudid since 4 am.The IV pump was beeping occlusion patient side. I found the primary tube clamped . She rates her pain at the RU chest a "10". . Her resp are panting. She states "I can"t  breath" O2 sat is 97% . She also stated " I feel so bad". She has asked me to call [name] and ask for a bolus of dilaudid.. I will send a message to [name]. She refuses to take a deep breath so I can assess her lungs. She  states "it hurts to much to breath "

## 2017-07-05 NOTE — Progress Notes (Signed)
Pt refuses to go to xray . Refuses to go in the bed. She is in her room crying stating the we " have not taken care of her pain in 3 days'' and she wants to go home.. I notified and spoke to [name]. She stated she will come and see [name] soon

## 2017-07-05 NOTE — Progress Notes (Signed)
Pt is sleeping .Respirations even and unlabored

## 2017-07-06 ENCOUNTER — Other Ambulatory Visit: Payer: Self-pay

## 2017-07-06 DIAGNOSIS — R160 Hepatomegaly, not elsewhere classified: Secondary | ICD-10-CM

## 2017-07-06 LAB — CBC WITH DIFFERENTIAL/PLATELET
Basophils Absolute: 0 10*3/uL (ref 0.0–0.1)
Basophils Relative: 0 %
EOS ABS: 0.3 10*3/uL (ref 0.0–0.7)
EOS PCT: 2 %
HEMATOCRIT: 22.4 % — AB (ref 36.0–46.0)
Hemoglobin: 7.5 g/dL — ABNORMAL LOW (ref 12.0–15.0)
LYMPHS ABS: 1.9 10*3/uL (ref 0.7–4.0)
Lymphocytes Relative: 11 %
MCH: 27.1 pg (ref 26.0–34.0)
MCHC: 33.5 g/dL (ref 30.0–36.0)
MCV: 80.9 fL (ref 78.0–100.0)
Monocytes Absolute: 1.5 10*3/uL — ABNORMAL HIGH (ref 0.1–1.0)
Monocytes Relative: 9 %
NEUTROS ABS: 13.4 10*3/uL — AB (ref 1.7–7.7)
NRBC: 14 /100{WBCs} — AB
Neutrophils Relative %: 78 %
Platelets: 511 10*3/uL — ABNORMAL HIGH (ref 150–400)
RBC: 2.77 MIL/uL — ABNORMAL LOW (ref 3.87–5.11)
RDW: 18.4 % — AB (ref 11.5–15.5)
WBC: 17.1 10*3/uL — AB (ref 4.0–10.5)

## 2017-07-06 LAB — HEMOGLOBINOPATHY EVALUATION
HGB A2 QUANT: 3 % (ref 1.8–3.2)
Hgb A: 0 % — ABNORMAL LOW (ref 96.4–98.8)
Hgb C: 0 %
Hgb F Quant: 14.7 % — ABNORMAL HIGH (ref 0.0–2.0)
Hgb S Quant: 82.3 % — ABNORMAL HIGH
Hgb Variant: 0 %

## 2017-07-06 LAB — COMPREHENSIVE METABOLIC PANEL
ALK PHOS: 188 U/L — AB (ref 38–126)
ALT: 101 U/L — ABNORMAL HIGH (ref 14–54)
AST: 75 U/L — AB (ref 15–41)
Albumin: 3.5 g/dL (ref 3.5–5.0)
Anion gap: 10 (ref 5–15)
BILIRUBIN TOTAL: 3.8 mg/dL — AB (ref 0.3–1.2)
BUN: 7 mg/dL (ref 6–20)
CALCIUM: 8.7 mg/dL — AB (ref 8.9–10.3)
CO2: 24 mmol/L (ref 22–32)
CREATININE: 0.55 mg/dL (ref 0.44–1.00)
Chloride: 103 mmol/L (ref 101–111)
GFR calc Af Amer: >60 mL/min (ref >60)
GLUCOSE: 108 mg/dL — AB (ref 65–99)
Potassium: 3.4 mmol/L — ABNORMAL LOW (ref 3.5–5.1)
Sodium: 137 mmol/L (ref 135–145)
TOTAL PROTEIN: 7.5 g/dL (ref 6.5–8.1)

## 2017-07-06 LAB — RETICULOCYTES
RBC.: 2.77 MIL/uL — AB (ref 3.87–5.11)
Retic Count, Absolute: 396.1 10*3/uL — ABNORMAL HIGH (ref 19.0–186.0)
Retic Ct Pct: 14.3 % — ABNORMAL HIGH (ref 0.4–3.1)

## 2017-07-06 MED ORDER — OXYCODONE HCL 5 MG PO TABS
20.0000 mg | ORAL_TABLET | ORAL | Status: DC | PRN
  Administered 2017-07-06 – 2017-07-07 (×4): 20 mg via ORAL
  Filled 2017-07-06 (×4): qty 4

## 2017-07-06 MED ORDER — FUROSEMIDE 10 MG/ML IJ SOLN
40.0000 mg | Freq: Once | INTRAMUSCULAR | Status: AC
  Administered 2017-07-06: 40 mg via INTRAVENOUS
  Filled 2017-07-06: qty 4

## 2017-07-06 MED ORDER — HYDROMORPHONE 1 MG/ML IV SOLN
INTRAVENOUS | Status: DC
  Administered 2017-07-06: 9.41 mg via INTRAVENOUS
  Administered 2017-07-06: 7.72 mg via INTRAVENOUS
  Administered 2017-07-06: 8.28 mg via INTRAVENOUS
  Administered 2017-07-07: 7.4 mg via INTRAVENOUS
  Administered 2017-07-07: 2.45 mg via INTRAVENOUS
  Administered 2017-07-07: 7.82 mg via INTRAVENOUS
  Administered 2017-07-07: 7.65 mg via INTRAVENOUS
  Filled 2017-07-06: qty 25

## 2017-07-06 NOTE — Progress Notes (Signed)
Room air sats 97% with pulse of 86 prior to ambulation. Post ambulation sats 95% on room air with pulse 107.

## 2017-07-06 NOTE — Progress Notes (Signed)
SICKLE CELL SERVICE PROGRESS NOTE  [name] TMH:[id number] DOB: 04-May-1991 DOA: 07/01/2017 PCP: Nancy Jun, FNP  Assessment/Plan: Principal Problem:   Sickle cell pain crisis ([place]) Active Problems:   Major depression, chronic   Sickle cell crisis ([place])   Anemia of chronic disease   Leukocytosis  1. 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 She is s/p transfusion of 2 units RBC's. Will continue to monitor Hb and if stable tomorrow, she will be able to go home.  2. Hb SS with crisis:  Continue PCA with basal dose. Continue Toradol and L-glutamine to assist in decreasing oxidative stress. It was demonstrated that higher doses of opiates were ineffective in alleviating her pain when she received a bolus of IV Dilaudid and shortly after had increase in pain. This supports the principle of hyperalgesia in this patient. 3. Anemia of Chronic Disease: Pt s/p transfusion of 2 units RBC's. Hb today at 7.5 g/dL. Pre-transfusion Electrophoresis showed Hb S of 82.3% and Hb F of 14%. Pt is encouraged to take her [place] on a daily basis to prevent crisis.  4. Chronic Pain Syndrome: Pt has had excessive exposure to opiates both prescribed and unprescribed. She has exhibited withdrawal and hyperalgesia. A well her behaviors are consistent with craving of opiates and her management of stress situations and the ability to control her impulses is impaired which signals damage to the pre-frontal cortex. Pt needs to go through detox to reset receptors and also engage in psychotherapy.  Continue MS Contin and Neurontin.  5. Leukocytosis: No evidence of infection. Leukocytosis is a feature of sickle cell crisis.  6. Sickle Cell Disease: Pt is not adherent to Hydrea and thus has a more severe expression of her disease symptoms including pain. I have had several discussions with [name] about how we can assist her with adherence to her medications however she has not  identified any barriers with which we can assist. Will ask SW to see patient to help to identify barriers to her adherence.  7. Fever:Resolved since receiving transfusions.  8. Constipation: Pt had BM today in response to laxative.   Code Status: Full Code Family Communication: N/A Disposition Plan: Anticipate discharge tomorrow.   MATTHEWS,MICHELLE A.  Pager [phone]. If 7PM-7AM, please contact night-coverage.  07/06/2017, 2:18 PM  LOS: 4 days   Interim History: Pt up ambulating states that her pain is localized to the RUQ at an intensity of 5/10. Pt states that pain was at 3/10 this morning. She has used 53.84 mg with 96/90:demands/deliveries in the last 24 hours.   Consultants:  None  Procedures:  None  Antibiotics:  None    Objective: Vitals:   07/06/17 0434 07/06/17 0453 07/06/17 0830 07/06/17 1157  BP:  121/70    Pulse:  90    Resp: 18 20 20 20   Temp:  99.4 F (37.4 C)    TempSrc:  Oral    SpO2: 92% 99% 98% 96%  Weight:      Height:       Weight change:   Intake/Output Summary (Last 24 hours) at 07/06/2017 1418 Last data filed at 07/06/2017 0300 Gross per 24 hour  Intake 1318.5 ml  Output 600 ml  Net 718.5 ml      Physical Exam General: Alert, awake, oriented x3, in no apparent distress  Pt up ambulating in halls.  HEENT: [place]/AT PEERL, EOMI, anicteric. Neck: Trachea midline,  no masses, no thyromegal,y no JVD, no carotid  bruit OROPHARYNX:  Moist, No exudate/ erythema/lesions.  Heart: Regular rate and rhythm, without murmurs, rubs, gallops, PMI non-displaced, no heaves or thrills on palpation.  Lungs: Clear to auscultation, no wheezing or rhonchi noted. No increased vocal fremitus resonant to percussion  Abdomen: Soft, mild RUQ tenderness. Abdomen  nondistended, positive bowel sounds, no masses no hepatosplenomegaly noted.  Neuro: No focal neurological deficits noted cranial nerves II through XII grossly intact.  Unable to test strength due to pain.  . Musculoskeletal: No warmth swelling or erythema around joints, no spinal tenderness noted. Psychiatric: Patient alert and oriented x 3, good insight and cognition, good recent to remote recall.    Data Reviewed: Basic Metabolic Panel: Recent Labs  Lab 07/02/17 0024 07/03/17 0550 07/04/17 1122 07/05/17 0423 07/06/17 1001  NA 136 138 134* 137 137  K 3.6 3.6 3.6 3.2* 3.4*  CL 100* 105 100* 102 103  CO2 26 26 23 23 24   GLUCOSE 114* 127* 112* 145* 108*  BUN 9 6 8 6 7   CREATININE 0.79 0.63 0.66 0.68 0.55  CALCIUM 8.7* 8.3* 8.3* 8.3* 8.7*   Liver Function Tests: Recent Labs  Lab 07/02/17 0024 07/03/17 0550 07/04/17 1122 07/05/17 0423 07/06/17 1001  AST 43* 34 29 36 75*  ALT 34 36 41 51 101*  ALKPHOS 77 97 131* 145* 188*  BILITOT 4.5* 3.4* 4.5* 4.4* 3.8*  PROT 7.3 6.3* 6.4* 6.6 7.5  ALBUMIN 4.1 3.3* 3.4* 3.2* 3.5   No results for input(s): LIPASE, AMYLASE in the last 168 hours. No results for input(s): AMMONIA in the last 168 hours. CBC: Recent Labs  Lab 07/02/17 0024 07/03/17 0550 07/04/17 1122 07/05/17 0423 07/06/17 1001  WBC 28.7* 23.4* 21.0* 20.6* 17.1*  NEUTROABS 22.9* 19.9* 18.2* 17.9* 13.4*  HGB 7.4* 6.3* 5.7* 6.4* 7.5*  HCT 21.5* 18.5* 16.2* 18.4* 22.4*  MCV 84.6 84.1 81.8 79.7 80.9  PLT 480* 369 378 404* 511*   Cardiac Enzymes: Recent Labs  Lab 07/04/17 1122  TROPONINI <0.03   BNP (last 3 results) No results for input(s): BNP in the last 8760 hours.  ProBNP (last 3 results) No results for input(s): PROBNP in the last 8760 hours.  CBG: No results for input(s): GLUCAP in the last 168 hours.  Recent Results (from the past 240 hour(s))  Respiratory Panel by PCR     Status: None   Collection Time: 07/02/17  1:43 PM  Result Value Ref Range Status   Adenovirus NOT DETECTED NOT DETECTED Final   Coronavirus 229E NOT DETECTED NOT DETECTED Final   Coronavirus HKU1 NOT DETECTED NOT DETECTED Final   Coronavirus NL63 NOT DETECTED NOT DETECTED Final    Coronavirus OC43 NOT DETECTED NOT DETECTED Final   Metapneumovirus NOT DETECTED NOT DETECTED Final   Rhinovirus / Enterovirus NOT DETECTED NOT DETECTED Final   Influenza A NOT DETECTED NOT DETECTED Final   Influenza B NOT DETECTED NOT DETECTED Final   Parainfluenza Virus 1 NOT DETECTED NOT DETECTED Final   Parainfluenza Virus 2 NOT DETECTED NOT DETECTED Final   Parainfluenza Virus 3 NOT DETECTED NOT DETECTED Final   Parainfluenza Virus 4 NOT DETECTED NOT DETECTED Final   Respiratory Syncytial Virus NOT DETECTED NOT DETECTED Final   Bordetella pertussis NOT DETECTED NOT DETECTED Final   Chlamydophila pneumoniae NOT DETECTED NOT DETECTED Final   Mycoplasma pneumoniae NOT DETECTED NOT DETECTED Final    Comment: Performed at [facility], [address] [place], [place] [address]  Culture, blood (Routine X 2)  w Reflex to ID Panel     Status: None (Preliminary result)   Collection Time: 07/04/17  1:53 PM  Result Value Ref Range Status   Specimen Description   Final    BLOOD LEFT ANTECUBITAL Performed at [facility] [address] [place], [place] [address]    Special Requests   Final    IN PEDIATRIC BOTTLE Blood Culture adequate volume Performed at [facility] [address] [place], [place] [address]    Culture   Final    NO GROWTH < 24 HOURS Performed at [facility] [address] [place], [place] [address]    Report Status PENDING  Incomplete  Culture, blood (Routine X 2) w Reflex to ID Panel     Status: None (Preliminary result)   Collection Time: 07/04/17  2:03 PM  Result Value Ref Range Status   Specimen Description   Final    BLOOD LEFT HAND Performed at [facility] [address] [place], [place] [address]    Special Requests   Final    BOTTLES DRAWN AEROBIC ONLY Blood Culture adequate volume Performed at [facility] [address] [place], [place] [address]     Culture   Final    NO GROWTH < 24 HOURS Performed at [facility] [address] [place],  [address]    Report Status PENDING  Incomplete     Studies: Dg Chest 2 View  Result Date: 07/05/2017 CLINICAL DATA:  Fever.  Chest pain under both breasts. EXAM: CHEST  2 VIEW COMPARISON:  05/29/2017 FINDINGS: Borderline heart size. Stable mediastinal contours. Porta catheter on the right with tip at the SVC level. There is streaky density at the bases with trace pleural effusions. Mild fissural thickening and airway cuffing. IMPRESSION: 1. Mild lung opacity at the bases with CT appearance yesterday favoring atelectasis over infection. Need clinical correlation for acute chest syndrome. 2. Trace pleural effusions and airway cuffing, is there volume overload in this patient with mild cardiomegaly? Electronically Signed   By: Monte Fantasia M.D.   On: 07/05/2017 17:01   Ct Abdomen W Contrast  Result Date: 07/04/2017 CLINICAL DATA:  Right upper quadrant pain history of sickle cell EXAM: CT ABDOMEN WITH CONTRAST TECHNIQUE: Multidetector CT imaging of the abdomen was performed using the standard protocol following bolus administration of intravenous contrast. CONTRAST:  11mL ISOVUE-300 IOPAMIDOL (ISOVUE-300) INJECTION 61% COMPARISON:  CT 02/27/2017, 02/26/2016, 06/22/2012 FINDINGS: Lower chest: Lung bases demonstrate small pleural effusions. Patchy dependent consolidations and ground-glass densities. Mild cardiomegaly. Hepatobiliary: Liver is enlarged, with craniocaudad measurement of 22 cm. No focal hepatic abnormality is seen. Surgical clips at the gallbladder fossa. No biliary dilatation. Pancreas: Unremarkable. No pancreatic ductal dilatation or surrounding inflammatory changes. Spleen: Heterogeneous with multiple hypodense nodules, stable compared with multiple prior studies Adrenals/Urinary Tract: Adrenal glands are within normal limits. Kidneys are unremarkable Stomach/Bowel: Stomach within  normal limits. Visible colon and small bowel are within normal limits. Vascular/Lymphatic: Nonaneurysmal upper abdominal aorta. No significantly enlarged lymph nodes. Other: Negative for free air or ascites in the upper abdomen Musculoskeletal: Bony changes at the spine consistent with history of sickle cell disease. IMPRESSION: 1. Hepatomegaly without focal hepatic abnormality or biliary dilatation 2. Status post cholecystectomy 3. Small pleural effusions. Patchy dependent consolidations and ground-glass densities, favor atelectasis, less likely infection. 4. Stable diffuse nodularity of the spleen Electronically Signed   By: Donavan Foil M.D.   On: 07/04/2017  18:53    Scheduled Meds: . heparin lock flush  500 Units Intracatheter Once  . HYDROmorphone   Intravenous Q4H  . hydroxyurea  1,000 mg Oral QODAY  . hydroxyurea  500 mg Oral QODAY  . ketorolac  30 mg Intravenous Q6H  . L-glutamine  15 g Oral BID  . morphine  30 mg Oral Q12H  . multivitamin with minerals  1 tablet Oral Daily  . rivaroxaban  20 mg Oral Q supper  . senna-docusate  1 tablet Oral BID  . vitamin B-12  250 mcg Oral Daily   Continuous Infusions: . sodium chloride    . sodium chloride 10 mL/hr at 07/04/17 1433  . sodium chloride    . sodium chloride    . dextrose 5 % and 0.45% NaCl 10 mL/hr at 07/05/17 2236    Principal Problem:   Sickle cell pain crisis [facility]) Active Problems:   Major depression, chronic   Sickle cell crisis ([name])   Anemia of chronic disease   Leukocytosis    In excess of 35 minutes spent during this visit. Greater than 50% involved face to face contact with the patient for assessment, counseling and coordination of care.

## 2017-07-06 NOTE — Progress Notes (Signed)
SICKLE CELL SERVICE PROGRESS NOTE  [name] IRS:[id number] DOB: Jan 18, 1991 DOA: 07/01/2017 PCP: Scot Jun, FNP  Assessment/Plan: Principal Problem:   Sickle cell pain crisis ([place]) Active Problems:   Major depression, chronic   Sickle cell crisis ([name])   Anemia of chronic disease   Leukocytosis  1. RUQ pain and tenderness: Pt is s/p transfusion og 1 unit RBC's and RUQ tenderness has decreased. She is still having pain in the RUQ with deep breaths.  2. Hb SS with Crisis: Continue PCA at current bolus dose and add a basal rate. Also continue Toradol. She has demonstrated hyperalgesia so will not increase bolus dose on the PCA..  3. Abnormal lung findings on Radiology studies: CT abdomen shows dependent opacity most consistent with atelectasis. However will obtain a dedicated CXR to evaluate.  4. Anemia of Chronic Disease: Hb 6.4 g/dL after transfusion 1 unit RBC's. Reticulocytosis robust. Continue Hydrea.  5. Chronic Pain Syndrome: Continue MS Contin. Pt has demonstrated hyperalgesia.  6. Chronic Anticoagulation: Continue Xarelto 7. Constipation: Pt has been receiving Senna-S but has not had a BM. She has Miralax ordered and will request it.   Code Status: Full Code Family Communication: N/A Disposition Plan: Not yet ready for discharge  [place].  Pager [phone]. If 7PM-7AM, please contact night-coverage.  07/06/2017, 11:28 AM  LOS: 4 days   Interim History: Findings if CT Scan reviewed with Radiology and when compared to previous CT from 02/2016, there is enlargement of liver without focal findings. She has had only  Transfusions since that CT and the enlargement is likely attributed to hepatic sequestration. She is again screaming in pain today but has not maximized the use of the PCA. She refused the transfusion overnight and is now willing to receive it. Pt does not seem to comprehend the severity of her illness and has been un-cooperative with treatment  except for demanding IVP Dilaudid despite the fact that it worsened her pain. She acknowledges that the IVP Dilaudid helps only for a few minutes and then the pain gets worse but she does not make the connection. Currently she is in a high stress state. I have made her aware of the positive UDS and she denies using Cocaine but admits to Marijuana use prior to hospitalization.   Consultants:  None  Procedures:  None  Antibiotics:  None    Objective: Physical Exam Vital Signs: Reviewed and shows improved BP and HR.  General: Alert, awake, oriented x 3, in much less physical distress than yesterday.  HEENT: [place]/AT PEERL, EOMI, anicteric OROPHARYNX:  Moist, No exudate/ erythema/lesions.  Heart: Regular rate and rhythm, without murmurs, rubs, gallops, PMI non-displaced, no heaves or thrills on palpation.  Lungs: Examination limited due to pain and shallow breathing. Clear to auscultation, no wheezing or rhonchi noted.  Abdomen: Mildly distended. Less severe RUQ tenderness. There is voluntary guarding but no rebound and abdomen is non-surgical.  Positive bowel sounds.  Neuro: No focal neurological deficits noted cranial nerves II through XII grossly intact. Unable to test strength due to pain but patient able to move all extremities and move independently in bed.   Musculoskeletal: No warmth swelling or erythema around joints, no spinal tenderness noted. Psychiatric: Patient alert and oriented x3, good insight and cognition, good recent to remote recall.    Data Reviewed: Basic Metabolic Panel: Lab 29/93/71 0024 07/03/17 0550 07/04/17 1122 07/05/17 0423  NA 136 138 134* 137  K 3.6 3.6 3.6 3.2*  CL 100* 105 100*  102  CO2 26 26 23 23   GLUCOSE 114* 127* 112* 145*  BUN 9 6 8 6   CREATININE 0.79 0.63 0.66 0.68  CALCIUM 8.7* 8.3* 8.3* 8.3*   Liver Function Tests: Lab 07/02/17 0024 07/03/17 0550 07/04/17 1122 07/05/17 0423  AST 43* 34 29 36  ALT 34 36 41 51  ALKPHOS 77 97 131*  145*  BILITOT 4.5* 3.4* 4.5* 4.4*  PROT 7.3 6.3* 6.4* 6.6  ALBUMIN 4.1 3.3* 3.4* 3.2*   No results for input(s): LIPASE, AMYLASE in the last 168 hours. No results for input(s): AMMONIA in the last 168 hours. CBC: Lab 07/02/17 0024 07/03/17 0550 07/04/17 1122 07/05/17 0423  WBC 28.7* 23.4* 21.0* 20.6*  NEUTROABS 22.9* 19.9* 18.2* 17.9*  HGB 7.4* 6.3* 5.7* 6.4*  HCT 21.5* 18.5* 16.2* 18.4*  MCV 84.6 84.1 81.8 79.7  PLT 480* 369 378 404*   Cardiac Enzymes: Recent Labs  Lab 07/04/17 1122  TROPONINI <0.03   BNP (last 3 results) No results for input(s): BNP in the last 8760 hours.  ProBNP (last 3 results) No results for input(s): PROBNP in the last 8760 hours.  CBG: No results for input(s): GLUCAP in the last 168 hours.  Recent Results (from the past 240 hour(s))  Respiratory Panel by PCR     Status: None   Collection Time: 07/02/17  1:43 PM  Result Value Ref Range Status   Adenovirus NOT DETECTED NOT DETECTED Final   Coronavirus 229E NOT DETECTED NOT DETECTED Final   Coronavirus HKU1 NOT DETECTED NOT DETECTED Final   Coronavirus NL63 NOT DETECTED NOT DETECTED Final   Coronavirus OC43 NOT DETECTED NOT DETECTED Final   Metapneumovirus NOT DETECTED NOT DETECTED Final   Rhinovirus / Enterovirus NOT DETECTED NOT DETECTED Final   Influenza A NOT DETECTED NOT DETECTED Final   Influenza B NOT DETECTED NOT DETECTED Final   Parainfluenza Virus 1 NOT DETECTED NOT DETECTED Final   Parainfluenza Virus 2 NOT DETECTED NOT DETECTED Final   Parainfluenza Virus 3 NOT DETECTED NOT DETECTED Final   Parainfluenza Virus 4 NOT DETECTED NOT DETECTED Final   Respiratory Syncytial Virus NOT DETECTED NOT DETECTED Final   Bordetella pertussis NOT DETECTED NOT DETECTED Final   Chlamydophila pneumoniae NOT DETECTED NOT DETECTED Final   Mycoplasma pneumoniae NOT DETECTED NOT DETECTED Final    Comment: Performed at [facility], [address] [place], [place] [address]  Culture, blood  (Routine X 2) w Reflex to ID Panel     Status: None (Preliminary result)   Collection Time: 07/04/17  1:53 PM  Result Value Ref Range Status   Specimen Description   Final    BLOOD LEFT ANTECUBITAL Performed at [facility] [address], [place], [place] [address]    Special Requests   Final    IN PEDIATRIC BOTTLE Blood Culture adequate volume Performed at [facility] [address], [place], [place] [address]    Culture   Final    NO GROWTH < 24 HOURS Performed at [facility] [address], [place], [place] [address]    Report Status PENDING  Incomplete  Culture, blood (Routine X 2) w Reflex to ID Panel     Status: None (Preliminary result)   Collection Time: 07/04/17  2:03 PM  Result Value Ref Range Status   Specimen Description   Final    BLOOD LEFT HAND Performed at [facility] [address] [place], [place] [address]  Special Requests   Final    BOTTLES DRAWN AEROBIC ONLY Blood Culture adequate volume Performed at [facility] [address], [place], [place] [address]    Culture   Final    NO GROWTH < 24 HOURS Performed at [facility] [address], [place], [place] [address]    Report Status PENDING  Incomplete     Studies: Dg Chest 2 View  Result Date: 07/05/2017 CLINICAL DATA:  Fever.  Chest pain under both breasts. EXAM: CHEST  2 VIEW COMPARISON:  05/29/2017 FINDINGS: Borderline heart size. Stable mediastinal contours. Porta catheter on the right with tip at the SVC level. There is streaky density at the bases with trace pleural effusions. Mild fissural thickening and airway cuffing. IMPRESSION: 1. Mild lung opacity at the bases with CT appearance yesterday favoring atelectasis over infection. Need clinical correlation for acute chest syndrome. 2. Trace pleural effusions and airway cuffing, is there volume overload in this patient with mild cardiomegaly?  Electronically Signed   By: Monte Fantasia M.D.   On: 07/05/2017 17:01   Ct Abdomen W Contrast  Result Date: 07/04/2017 CLINICAL DATA:  Right upper quadrant pain history of sickle cell EXAM: CT ABDOMEN WITH CONTRAST TECHNIQUE: Multidetector CT imaging of the abdomen was performed using the standard protocol following bolus administration of intravenous contrast. CONTRAST:  181mL ISOVUE-300 IOPAMIDOL (ISOVUE-300) INJECTION 61% COMPARISON:  CT 02/27/2017, 02/26/2016, 06/22/2012 FINDINGS: Lower chest: Lung bases demonstrate small pleural effusions. Patchy dependent consolidations and ground-glass densities. Mild cardiomegaly. Hepatobiliary: Liver is enlarged, with craniocaudad measurement of 22 cm. No focal hepatic abnormality is seen. Surgical clips at the gallbladder fossa. No biliary dilatation. Pancreas: Unremarkable. No pancreatic ductal dilatation or surrounding inflammatory changes. Spleen: Heterogeneous with multiple hypodense nodules, stable compared with multiple prior studies Adrenals/Urinary Tract: Adrenal glands are within normal limits. Kidneys are unremarkable Stomach/Bowel: Stomach within normal limits. Visible colon and small bowel are within normal limits. Vascular/Lymphatic: Nonaneurysmal upper abdominal aorta. No significantly enlarged lymph nodes. Other: Negative for free air or ascites in the upper abdomen Musculoskeletal: Bony changes at the spine consistent with history of sickle cell disease. IMPRESSION: 1. Hepatomegaly without focal hepatic abnormality or biliary dilatation 2. Status post cholecystectomy 3. Small pleural effusions. Patchy dependent consolidations and ground-glass densities, favor atelectasis, less likely infection. 4. Stable diffuse nodularity of the spleen Electronically Signed   By: Donavan Foil M.D.   On: 07/04/2017 18:53    Scheduled Meds: . heparin lock flush  500 Units Intracatheter Once  . HYDROmorphone   Intravenous Q4H  . hydroxyurea  1,000 mg Oral QODAY   . hydroxyurea  500 mg Oral QODAY  . ketorolac  30 mg Intravenous Q6H  . L-glutamine  15 g Oral BID  . morphine  30 mg Oral Q12H  . multivitamin with minerals  1 tablet Oral Daily  . rivaroxaban  20 mg Oral Q supper  . senna-docusate  1 tablet Oral BID  . vitamin B-12  250 mcg Oral Daily   Continuous Infusions: . sodium chloride    . sodium chloride 10 mL/hr at 07/04/17 1433  . sodium chloride    . sodium chloride    . dextrose 5 % and 0.45% NaCl 10 mL/hr at 07/05/17 2236    Principal Problem:   Sickle cell pain crisis [facility]) Active Problems:   Major depression, chronic   Sickle cell crisis ([name])   Anemia of chronic disease   Leukocytosis    In excess of 40  minutes spent during this visit. Greater than 50% involved face to face contact with the patient for assessment, counseling and coordination of care.    Anthonella Klausner A.

## 2017-07-07 DIAGNOSIS — F1190 Opioid use, unspecified, uncomplicated: Secondary | ICD-10-CM

## 2017-07-07 LAB — BPAM RBC
BLOOD PRODUCT EXPIRATION DATE: 201903122359
Blood Product Expiration Date: 201903152359
Blood Product Expiration Date: 201903172359
Blood Product Expiration Date: 201903192359
ISSUE DATE / TIME: 201902191149
ISSUE DATE / TIME: 201902191225
ISSUE DATE / TIME: 201902201710
UNIT TYPE AND RH: 5100
UNIT TYPE AND RH: 5100
Unit Type and Rh: 5100
Unit Type and Rh: 5100

## 2017-07-07 LAB — CBC WITH DIFFERENTIAL/PLATELET
BASOS PCT: 0 %
Basophils Absolute: 0 10*3/uL (ref 0.0–0.1)
EOS PCT: 3 %
Eosinophils Absolute: 0.4 10*3/uL (ref 0.0–0.7)
HCT: 20.8 % — ABNORMAL LOW (ref 36.0–46.0)
Hemoglobin: 7.2 g/dL — ABNORMAL LOW (ref 12.0–15.0)
Lymphocytes Relative: 11 %
Lymphs Abs: 1.5 10*3/uL (ref 0.7–4.0)
MCH: 28.2 pg (ref 26.0–34.0)
MCHC: 34.6 g/dL (ref 30.0–36.0)
MCV: 81.6 fL (ref 78.0–100.0)
MONO ABS: 1.2 10*3/uL — AB (ref 0.1–1.0)
Monocytes Relative: 9 %
Neutro Abs: 10.3 10*3/uL — ABNORMAL HIGH (ref 1.7–7.7)
Neutrophils Relative %: 77 %
PLATELETS: 497 10*3/uL — AB (ref 150–400)
RBC: 2.55 MIL/uL — ABNORMAL LOW (ref 3.87–5.11)
RDW: 18.7 % — AB (ref 11.5–15.5)
WBC: 13.4 10*3/uL — AB (ref 4.0–10.5)

## 2017-07-07 LAB — TYPE AND SCREEN
ABO/RH(D): B POS
ANTIBODY SCREEN: NEGATIVE
Donor AG Type: NEGATIVE
Donor AG Type: NEGATIVE
Donor AG Type: NEGATIVE
Donor AG Type: NEGATIVE
UNIT DIVISION: 0
UNIT DIVISION: 0
Unit division: 0
Unit division: 0

## 2017-07-07 LAB — RETICULOCYTES
RBC.: 2.55 MIL/uL — ABNORMAL LOW (ref 3.87–5.11)
Retic Count, Absolute: 329 10*3/uL — ABNORMAL HIGH (ref 19.0–186.0)
Retic Ct Pct: 12.9 % — ABNORMAL HIGH (ref 0.4–3.1)

## 2017-07-07 LAB — LACTATE DEHYDROGENASE: LDH: 343 U/L — ABNORMAL HIGH (ref 98–192)

## 2017-07-07 MED ORDER — SODIUM CHLORIDE 0.9% FLUSH: 10.0000 mL | Freq: Two times a day (BID) | INTRAVENOUS | Status: DC

## 2017-07-07 MED ORDER — ALTEPLASE 2 MG IJ SOLR
2.0000 mg | Freq: Once | INTRAMUSCULAR | Status: DC
  Filled 2017-07-07 (×2): qty 2

## 2017-07-07 MED ORDER — HEPARIN SOD (PORK) LOCK FLUSH 100 UNIT/ML IV SOLN
500.0000 [IU] | Freq: Once | INTRAVENOUS | Status: DC
  Filled 2017-07-07: qty 5

## 2017-07-07 MED ORDER — SODIUM CHLORIDE 0.9% FLUSH: 10.0000 mL | INTRAVENOUS | Status: DC | PRN

## 2017-07-07 NOTE — Discharge Summary (Signed)
[name] MRN: [id number] DOB/AGE: [birth year] 26 y.o.  Admit date: 07/01/2017 Discharge date: 07/07/2017  Primary Care Physician:  Scot Jun, FNP   Discharge Diagnoses:   Patient Active Problem List   Diagnosis Date Noted  . Major depression, chronic 01/06/2011    Priority: High  . Menorrhagia   . Chronic anticoagulation   . Sickle cell anemia ([place]) 05/19/2016  . History of DVT (deep vein thrombosis) 04/17/2016  . Thrombocytosis ([place]) 11/09/2015  . Hyperbilirubinemia 11/09/2015  . Chronic pain 08/04/2015  . Herpes simplex 07/14/2015  . Anemia of chronic disease   . 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     Latex Rash      Medication List    TAKE these medications   aspirin-acetaminophen-caffeine 250-250-65 MG tablet Commonly known as:  EXCEDRIN MIGRAINE Take 2 tablets every 6 (six) hours as needed by mouth for headache.   ENERGY CHEWS PO Take 2 tablets daily by mouth.   hydroxyurea 500 MG capsule Commonly known as:  HYDREA Take 2 tabs (1000 mg ) PO QOD starting on 06/02/2017 alternating with 1 tab (500 mg) PO QOD starting on 06/03/2017. May take with food to minimize GI side effects.   medroxyPROGESTERone 150 MG/ML injection Commonly known as:  DEPO-PROVERA Inject 1 mL (150 mg total) into the muscle every 3 (three) months.   morphine 30 MG 12 hr tablet Commonly known as:  MS CONTIN Take 30 mg by mouth every 12 (twelve) hours. What changed:  Another medication with the same name was removed. Continue taking this medication, and follow the directions you see here.   multivitamin with minerals Tabs tablet Take 1 tablet daily by mouth.   Oxycodone HCl 20 MG Tabs Take 20 mg every 4 (four) hours as needed by mouth (pain).   rivaroxaban 20 MG Tabs tablet Commonly known as:   XARELTO Take 1 tablet (20 mg total) by mouth daily with supper.         Consults:    SIGNIFICANT DIAGNOSTIC STUDIES:  Dg Chest 2 View  Result Date: 07/05/2017 CLINICAL DATA:  Fever.  Chest pain under both breasts. EXAM: CHEST  2 VIEW COMPARISON:  05/29/2017 FINDINGS: Borderline heart size. Stable mediastinal contours. Porta catheter on the right with tip at the SVC level. There is streaky density at the bases with trace pleural effusions. Mild fissural thickening and airway cuffing. IMPRESSION: 1. Mild lung opacity at the bases with CT appearance yesterday favoring atelectasis over infection. Need clinical correlation for acute chest syndrome. 2. Trace pleural effusions and airway cuffing, is there volume overload in this patient with mild cardiomegaly? Electronically Signed   By: Monte Fantasia M.D.   On: 07/05/2017 17:01   Ct Abdomen W Contrast  Result Date: 07/04/2017 CLINICAL DATA:  Right upper quadrant pain history of sickle cell EXAM: CT ABDOMEN WITH CONTRAST TECHNIQUE: Multidetector CT imaging of the abdomen was performed using the standard protocol following bolus administration of intravenous contrast. CONTRAST:  110mL ISOVUE-300 IOPAMIDOL (ISOVUE-300) INJECTION 61% COMPARISON:  CT 02/27/2017, 02/26/2016, 06/22/2012 FINDINGS: Lower chest: Lung bases demonstrate small pleural effusions. Patchy dependent consolidations and ground-glass densities. Mild cardiomegaly. Hepatobiliary: Liver is enlarged, with craniocaudad measurement of 22 cm. No focal hepatic abnormality is seen. Surgical clips at the gallbladder fossa. No biliary dilatation. Pancreas: Unremarkable. No  pancreatic ductal dilatation or surrounding inflammatory changes. Spleen: Heterogeneous with multiple hypodense nodules, stable compared with multiple prior studies Adrenals/Urinary Tract: Adrenal glands are within normal limits. Kidneys are unremarkable Stomach/Bowel: Stomach within normal limits. Visible colon and small bowel  are within normal limits. Vascular/Lymphatic: Nonaneurysmal upper abdominal aorta. No significantly enlarged lymph nodes. Other: Negative for free air or ascites in the upper abdomen Musculoskeletal: Bony changes at the spine consistent with history of sickle cell disease. IMPRESSION: 1. Hepatomegaly without focal hepatic abnormality or biliary dilatation 2. Status post cholecystectomy 3. Small pleural effusions. Patchy dependent consolidations and ground-glass densities, favor atelectasis, less likely infection. 4. Stable diffuse nodularity of the spleen Electronically Signed   By: Donavan Foil M.D.   On: 07/04/2017 18:53       Recent Results (from the past 240 hour(s))  Respiratory Panel by PCR     Status: None   Collection Time: 07/02/17  1:43 PM  Result Value Ref Range Status   Adenovirus NOT DETECTED NOT DETECTED Final   Coronavirus 229E NOT DETECTED NOT DETECTED Final   Coronavirus HKU1 NOT DETECTED NOT DETECTED Final   Coronavirus NL63 NOT DETECTED NOT DETECTED Final   Coronavirus OC43 NOT DETECTED NOT DETECTED Final   Metapneumovirus NOT DETECTED NOT DETECTED Final   Rhinovirus / Enterovirus NOT DETECTED NOT DETECTED Final   Influenza A NOT DETECTED NOT DETECTED Final   Influenza B NOT DETECTED NOT DETECTED Final   Parainfluenza Virus 1 NOT DETECTED NOT DETECTED Final   Parainfluenza Virus 2 NOT DETECTED NOT DETECTED Final   Parainfluenza Virus 3 NOT DETECTED NOT DETECTED Final   Parainfluenza Virus 4 NOT DETECTED NOT DETECTED Final   Respiratory Syncytial Virus NOT DETECTED NOT DETECTED Final   Bordetella pertussis NOT DETECTED NOT DETECTED Final   Chlamydophila pneumoniae NOT DETECTED NOT DETECTED Final   Mycoplasma pneumoniae NOT DETECTED NOT DETECTED Final    Comment: Performed at [facility], [address], [place], [place] [address]  Culture, blood (Routine X 2) w Reflex to ID Panel     Status: None (Preliminary result)   Collection Time: 07/04/17  1:53 PM  Result  Value Ref Range Status   Specimen Description   Final    BLOOD LEFT ANTECUBITAL Performed at [facility] [address], [place], [place] [address]    Special Requests   Final    IN PEDIATRIC BOTTLE Blood Culture adequate volume Performed at [facility] [address] [place], [place] [address]    Culture   Final    NO GROWTH 2 DAYS Performed at [facility] [address] [place], [place] [address]    Report Status PENDING  Incomplete  Culture, blood (Routine X 2) w Reflex to ID Panel     Status: None (Preliminary result)   Collection Time: 07/04/17  2:03 PM  Result Value Ref Range Status   Specimen Description   Final    BLOOD LEFT HAND Performed at [facility] [address], [place], [place] [address]    Special Requests   Final    BOTTLES DRAWN AEROBIC ONLY Blood Culture adequate volume Performed at [facility] [address], [place], [place] [address]    Culture   Final    NO GROWTH 2 DAYS Performed at [facility] [address] [place], [place] [address]    Report Status PENDING  Incomplete    BRIEF ADMITTING H &  P: This is a 27 year old female with known history of sickle cell disease admitted with sickle cell painful crisis. Patient has had 6 mg of Dilaudid IV in the ER but not getting relief. Symptoms have been going on for the last 3 days after some upper respiratory tract infection. It is consistent with her typical sickle cell crisis. She has tried home medication again not getting any relief. Patient has had prior history of drug abuse but denied using any medications or drugs this time around. No fever or chills no nausea vomiting or diarrhea.     Hospital Course:  Present on Admission: . (Resolved) Sickle cell pain crisis ([place]) . Major depression, chronic . Anemia of chronic disease . (Resolved) Leukocytosis . (Resolved) Sickle cell crisis  ([name])  Clearance is an opiate tolerant patient with a hemoglobin SS who was admitted with what appeared to be simple crisis.  The patient's pain was initially managed with PCA, Toradol and IV fluids.  However about 48 hours after admission the patient developed exquisite pain and tenderness in the right upper quadrant.  A CT scan of the abdomen revealed hepatic enlargement as compared to previous CT scan on 02/2016.  In that time.  The patient had received transfusions of only 5 units of blood and thus it is felt that the patient was experiencing hepatic sequestration.  She was transfused 2 units RBCs and had a significant improvement in her clinical status with decrease in pain.  The patient also had been having fevers all cultures were negative.  It was felt that this was secondary to the sickle cell crisis and sickle hepatopathy.  Posttransfusion the fever resolved and patient also had an improvement in the leukocytosis.  During the time of her extreme pain a basal dose was added to the PCA for pain control.  Please note that this patient demonstrated hyperalgesia with an increase of pain within minutes after receiving bolus doses of Dilaudid.  Thus she should not receive bolus doses of Dilaudid as it leads to hyperalgesia.  It is also noted that this patient is extremely opiate tolerant and appears to have hyperactivity of the local ceruleus area given her tolerance and symptoms of withdrawal.  Additionally the patient displays very impulsive behaviors and poor ability to handle situations of stress which signal some involvement of the prefrontal cortex.  My recommendation is this patient be referred to a pain specialist or to be admitted for detox and reset of her receptors to engage in ongoing psychotherapy.  At the time of discharge patient was ambulatory without any difficulty.  She still had some pain however felt that the pain was manageable with her oral medications.  She is discharged home in good  condition.  Disposition and Follow-up: Patient is discharged home in good condition she is to follow-up with her primary care physician Dr. Alyson Ingles in 1 week. Discharge Instructions    Activity as tolerated - No restrictions   Complete by:  As directed    Diet general   Complete by:  As directed       DISCHARGE EXAM:  General: Alert, awake, oriented x 3.  Patient is well-appearing and in no apparent distress.  She is up ambulating without difficulty.  HEENT: [place]/AT PEERL, EOMI, anicteric Neck: Trachea midline, no masses, no thyromegal,y no JVD, no carotid bruit OROPHARYNX: Moist, No exudate/ erythema/lesions.  Heart: Regular rate and rhythm, without murmurs, rubs, gallops or S3. PMI non-displaced. Exam reveals no decreased pulses. Pulmonary/Chest: Normal effort. Breath sounds  normal. No. Apnea. Clear to auscultation,no stridor,  no wheezing and no rhonchi noted. No respiratory distress and no tenderness noted. Abdomen: Soft, mild right upper quadrant tenderness, nondistended, normal bowel sounds, no masses but hepatomegaly noted. No fluid wave and no ascites. There is no guarding or rebound. Neuro: Alert and oriented to person, place and time. Normal motor skills, Displays no atrophy or tremors and exhibits normal muscle tone.  No focal neurological deficits noted cranial nerves II through XII grossly intact. No sensory deficit noted. Strength at baseline in bilateral upper and lower extremities. Gait normal. Musculoskeletal: No warmth, swelling or erythema around joints, no spinal tenderness noted. Psychiatric: Patient alert and oriented x3, good insight and cognition, good recent to remote recall. Lymph node survey: No cervical axillary or inguinal lymphadenopathy noted. Skin: Skin is warm and dry. No bruising, no ecchymosis and no rash noted. Pt is not diaphoretic. No erythema. No pallor    Blood pressure (!) 119/56, pulse 80, temperature 98.7 F (37.1 C), temperature source Oral,  resp. rate 14, height 5\' 1"  (1.549 m), weight 96 kg (211 lb 10.3 oz), last menstrual period 06/29/2017, SpO2 98 %, not currently breastfeeding.  Recent Labs    07/05/17 0423 07/06/17 1001  NA 137 137  K 3.2* 3.4*  CL 102 103  CO2 23 24  GLUCOSE 145* 108*  BUN 6 7  CREATININE 0.68 0.55  CALCIUM 8.3* 8.7*   Recent Labs    07/05/17 0423 07/06/17 1001  AST 36 75*  ALT 51 101*  ALKPHOS 145* 188*  BILITOT 4.4* 3.8*  PROT 6.6 7.5  ALBUMIN 3.2* 3.5   No results for input(s): LIPASE, AMYLASE in the last 72 hours. Recent Labs    07/06/17 1001 07/07/17 1113  WBC 17.1* 13.4*  NEUTROABS 13.4* 10.3*  HGB 7.5* 7.2*  HCT 22.4* 20.8*  MCV 80.9 81.6  PLT 511* 497*     Total time spent including face to face and decision making was greater than 30 minutes  Signed: MATTHEWS,MICHELLE A. 07/07/2017, 2:04 PM

## 2017-07-09 LAB — CULTURE, BLOOD (ROUTINE X 2)
Culture: NO GROWTH
Culture: NO GROWTH
SPECIAL REQUESTS: ADEQUATE
Special Requests: ADEQUATE

## 2017-07-10 ENCOUNTER — Telehealth: Payer: Self-pay | Admitting: Family Medicine

## 2017-07-10 ENCOUNTER — Inpatient Hospital Stay (HOSPITAL_COMMUNITY)
Admission: EM | Admit: 2017-07-10 | Discharge: 2017-07-13 | DRG: 093 | Disposition: A | Discharge status: Home / Self Care | Payer: Medicare Other | Attending: Internal Medicine | Admitting: Internal Medicine

## 2017-07-10 ENCOUNTER — Emergency Department (HOSPITAL_COMMUNITY): Payer: Medicare Other

## 2017-07-10 ENCOUNTER — Encounter (HOSPITAL_COMMUNITY): Payer: Self-pay

## 2017-07-10 DIAGNOSIS — R1011 Right upper quadrant pain: Secondary | ICD-10-CM | POA: Diagnosis not present

## 2017-07-10 DIAGNOSIS — R0789 Other chest pain: Secondary | ICD-10-CM | POA: Diagnosis not present

## 2017-07-10 DIAGNOSIS — G894 Chronic pain syndrome: Principal | ICD-10-CM | POA: Diagnosis present

## 2017-07-10 DIAGNOSIS — Z91040 Latex allergy status: Secondary | ICD-10-CM | POA: Diagnosis not present

## 2017-07-10 DIAGNOSIS — D5700 Hb-SS disease with crisis, unspecified: Secondary | ICD-10-CM | POA: Insufficient documentation

## 2017-07-10 DIAGNOSIS — G8929 Other chronic pain: Secondary | ICD-10-CM

## 2017-07-10 DIAGNOSIS — Z7901 Long term (current) use of anticoagulants: Secondary | ICD-10-CM

## 2017-07-10 DIAGNOSIS — R52 Pain, unspecified: Secondary | ICD-10-CM | POA: Diagnosis present

## 2017-07-10 DIAGNOSIS — F1210 Cannabis abuse, uncomplicated: Secondary | ICD-10-CM | POA: Diagnosis not present

## 2017-07-10 DIAGNOSIS — Z91018 Allergy to other foods: Secondary | ICD-10-CM | POA: Diagnosis not present

## 2017-07-10 DIAGNOSIS — D638 Anemia in other chronic diseases classified elsewhere: Secondary | ICD-10-CM | POA: Diagnosis present

## 2017-07-10 DIAGNOSIS — F1410 Cocaine abuse, uncomplicated: Secondary | ICD-10-CM | POA: Diagnosis present

## 2017-07-10 DIAGNOSIS — D571 Sickle-cell disease without crisis: Secondary | ICD-10-CM | POA: Diagnosis present

## 2017-07-10 DIAGNOSIS — Z87891 Personal history of nicotine dependence: Secondary | ICD-10-CM

## 2017-07-10 DIAGNOSIS — Z832 Family history of diseases of the blood and blood-forming organs and certain disorders involving the immune mechanism: Secondary | ICD-10-CM | POA: Diagnosis not present

## 2017-07-10 DIAGNOSIS — R160 Hepatomegaly, not elsewhere classified: Secondary | ICD-10-CM

## 2017-07-10 DIAGNOSIS — R079 Chest pain, unspecified: Secondary | ICD-10-CM | POA: Diagnosis not present

## 2017-07-10 LAB — CBC WITH DIFFERENTIAL/PLATELET
BASOS ABS: 0 10*3/uL (ref 0.0–0.1)
Basophils Relative: 0 %
Eosinophils Absolute: 0.3 10*3/uL (ref 0.0–0.7)
Eosinophils Relative: 2 %
HEMATOCRIT: 25.5 % — AB (ref 36.0–46.0)
HEMOGLOBIN: 8.5 g/dL — AB (ref 12.0–15.0)
LYMPHS PCT: 11 %
Lymphs Abs: 1.6 10*3/uL (ref 0.7–4.0)
MCH: 27.5 pg (ref 26.0–34.0)
MCHC: 33.3 g/dL (ref 30.0–36.0)
MCV: 82.5 fL (ref 78.0–100.0)
MONOS PCT: 6 %
Monocytes Absolute: 0.9 10*3/uL (ref 0.1–1.0)
NEUTROS ABS: 11.4 10*3/uL — AB (ref 1.7–7.7)
Neutrophils Relative %: 81 %
Platelets: 842 10*3/uL — ABNORMAL HIGH (ref 150–400)
RBC: 3.09 MIL/uL — ABNORMAL LOW (ref 3.87–5.11)
RDW: 19.2 % — ABNORMAL HIGH (ref 11.5–15.5)
WBC: 14.2 10*3/uL — ABNORMAL HIGH (ref 4.0–10.5)

## 2017-07-10 LAB — RETICULOCYTES
RBC.: 3.09 MIL/uL — AB (ref 3.87–5.11)
RBC.: 3.13 MIL/uL — ABNORMAL LOW (ref 3.87–5.11)
Retic Count, Absolute: 358.4 10*3/uL — ABNORMAL HIGH (ref 19.0–186.0)
Retic Count, Absolute: 303.6 K/uL — ABNORMAL HIGH (ref 19.0–186.0)
Retic Ct Pct: 11.6 % — ABNORMAL HIGH (ref 0.4–3.1)
Retic Ct Pct: 9.7 % — ABNORMAL HIGH (ref 0.4–3.1)

## 2017-07-10 LAB — COMPREHENSIVE METABOLIC PANEL
ALBUMIN: 3.6 g/dL (ref 3.5–5.0)
ALT: 61 U/L — ABNORMAL HIGH (ref 14–54)
ANION GAP: 12 (ref 5–15)
AST: 20 U/L (ref 15–41)
Alkaline Phosphatase: 134 U/L — ABNORMAL HIGH (ref 38–126)
BUN: 7 mg/dL (ref 6–20)
CO2: 23 mmol/L (ref 22–32)
Calcium: 9.2 mg/dL (ref 8.9–10.3)
Chloride: 104 mmol/L (ref 101–111)
Creatinine, Ser: 0.62 mg/dL (ref 0.44–1.00)
GFR calc Af Amer: >60 mL/min (ref >60)
GFR calc non Af Amer: >60 mL/min (ref >60)
GLUCOSE: 105 mg/dL — AB (ref 65–99)
POTASSIUM: 4 mmol/L (ref 3.5–5.1)
SODIUM: 139 mmol/L (ref 135–145)
Total Bilirubin: 1.6 mg/dL — ABNORMAL HIGH (ref 0.3–1.2)
Total Protein: 8.2 g/dL — ABNORMAL HIGH (ref 6.5–8.1)

## 2017-07-10 LAB — I-STAT BETA HCG BLOOD, ED (MC, WL, AP ONLY): I-stat hCG, quantitative: 5.5 m[IU]/mL — ABNORMAL HIGH (ref <5)

## 2017-07-10 LAB — I-STAT TROPONIN, ED: TROPONIN I, POC: 0 ng/mL (ref 0.00–0.08)

## 2017-07-10 MED ORDER — RIVAROXABAN 20 MG PO TABS
20.0000 mg | ORAL_TABLET | Freq: Every day | ORAL | Status: DC
  Administered 2017-07-10 – 2017-07-12 (×3): 20 mg via ORAL
  Filled 2017-07-10 (×3): qty 1

## 2017-07-10 MED ORDER — MORPHINE SULFATE ER 30 MG PO TBCR
30.0000 mg | EXTENDED_RELEASE_TABLET | Freq: Two times a day (BID) | ORAL | Status: DC
  Administered 2017-07-10 – 2017-07-13 (×7): 30 mg via ORAL
  Filled 2017-07-10 (×6): qty 1
  Filled 2017-07-10: qty 2

## 2017-07-10 MED ORDER — DIPHENHYDRAMINE HCL 50 MG/ML IJ SOLN
25.0000 mg | Freq: Once | INTRAMUSCULAR | Status: AC
  Administered 2017-07-10: 25 mg via INTRAVENOUS
  Filled 2017-07-10: qty 1

## 2017-07-10 MED ORDER — HYDROXYUREA 500 MG PO CAPS
500.0000 mg | ORAL_CAPSULE | ORAL | Status: DC
  Administered 2017-07-11 – 2017-07-13 (×2): 500 mg via ORAL
  Filled 2017-07-10: qty 1

## 2017-07-10 MED ORDER — DEXTROSE-NACL 5-0.45 % IV SOLN
INTRAVENOUS | Status: DC
  Administered 2017-07-10 – 2017-07-12 (×6): via INTRAVENOUS

## 2017-07-10 MED ORDER — DIPHENHYDRAMINE HCL 25 MG PO CAPS: 25.0000 mg | ORAL_CAPSULE | ORAL | Status: DC | PRN

## 2017-07-10 MED ORDER — ONDANSETRON HCL 4 MG/2ML IJ SOLN
4.0000 mg | Freq: Four times a day (QID) | INTRAMUSCULAR | Status: DC | PRN
Start: 2017-07-10 — End: 2017-07-13

## 2017-07-10 MED ORDER — NALOXONE HCL 0.4 MG/ML IJ SOLN: 0.4000 mg | INTRAMUSCULAR | Status: DC | PRN

## 2017-07-10 MED ORDER — POLYETHYLENE GLYCOL 3350 17 G PO PACK: 17.0000 g | PACK | Freq: Every day | ORAL | Status: DC | PRN

## 2017-07-10 MED ORDER — HYDROMORPHONE HCL 2 MG/ML IJ SOLN: 2.0000 mg | INTRAMUSCULAR | Status: AC

## 2017-07-10 MED ORDER — HYDROXYUREA 500 MG PO CAPS
1000.0000 mg | ORAL_CAPSULE | ORAL | Status: DC
  Administered 2017-07-10 – 2017-07-12 (×2): 1000 mg via ORAL
  Filled 2017-07-10 (×3): qty 2

## 2017-07-10 MED ORDER — HYDROMORPHONE 1 MG/ML IV SOLN
INTRAVENOUS | Status: DC
  Administered 2017-07-10: 22:00:00 via INTRAVENOUS
  Administered 2017-07-11: 16.19 mg via INTRAVENOUS
  Administered 2017-07-11: 17.5 mg via INTRAVENOUS
  Administered 2017-07-11: 10 mg via INTRAVENOUS
  Administered 2017-07-11: 10.2 mg via INTRAVENOUS
  Administered 2017-07-11: 19:00:00 via INTRAVENOUS
  Administered 2017-07-11: 5.5 mg via INTRAVENOUS
  Administered 2017-07-12: 19:00:00 via INTRAVENOUS
  Administered 2017-07-12: 11 mg via INTRAVENOUS
  Administered 2017-07-12 (×2): 8.5 mg via INTRAVENOUS
  Administered 2017-07-12: 9.2 mg via INTRAVENOUS
  Administered 2017-07-12: 12 mg via INTRAVENOUS
  Administered 2017-07-12: 07:00:00 via INTRAVENOUS
  Administered 2017-07-13: 2.5 mg via INTRAVENOUS
  Administered 2017-07-13: 10 mg via INTRAVENOUS
  Filled 2017-07-10 (×6): qty 25

## 2017-07-10 MED ORDER — HYDROMORPHONE HCL 2 MG/ML IJ SOLN
2.0000 mg | INTRAMUSCULAR | Status: AC
  Administered 2017-07-10: 2 mg via INTRAVENOUS
  Filled 2017-07-10: qty 1

## 2017-07-10 MED ORDER — PROMETHAZINE HCL 25 MG PO TABS
25.0000 mg | ORAL_TABLET | ORAL | Status: DC | PRN
  Administered 2017-07-10: 25 mg via ORAL
  Filled 2017-07-10: qty 1

## 2017-07-10 MED ORDER — SODIUM CHLORIDE 0.9% FLUSH: 9.0000 mL | INTRAVENOUS | Status: DC | PRN

## 2017-07-10 MED ORDER — SENNOSIDES-DOCUSATE SODIUM 8.6-50 MG PO TABS
1.0000 | ORAL_TABLET | Freq: Two times a day (BID) | ORAL | Status: DC
  Administered 2017-07-10 – 2017-07-13 (×6): 1 via ORAL
  Filled 2017-07-10 (×7): qty 1

## 2017-07-10 MED ORDER — HYDROMORPHONE HCL 2 MG/ML IJ SOLN
2.0000 mg | INTRAMUSCULAR | Status: DC | PRN
  Administered 2017-07-10 – 2017-07-12 (×15): 2 mg via INTRAVENOUS
  Filled 2017-07-10 (×5): qty 1

## 2017-07-10 MED ORDER — SODIUM CHLORIDE 0.9 % IV SOLN
25.0000 mg | INTRAVENOUS | Status: DC | PRN
  Filled 2017-07-10: qty 0.5

## 2017-07-10 MED ORDER — KETOROLAC TROMETHAMINE 30 MG/ML IJ SOLN
30.0000 mg | INTRAMUSCULAR | Status: AC
  Administered 2017-07-10: 30 mg via INTRAVENOUS
  Filled 2017-07-10: qty 1

## 2017-07-10 MED ORDER — MEDROXYPROGESTERONE ACETATE 150 MG/ML IM SUSP: 150.0000 mg | INTRAMUSCULAR | Status: DC

## 2017-07-10 MED ORDER — KETOROLAC TROMETHAMINE 30 MG/ML IJ SOLN
30.0000 mg | Freq: Four times a day (QID) | INTRAMUSCULAR | Status: DC
  Administered 2017-07-10 – 2017-07-13 (×11): 30 mg via INTRAVENOUS
  Filled 2017-07-10 (×11): qty 1

## 2017-07-10 MED ORDER — ASPIRIN-ACETAMINOPHEN-CAFFEINE 250-250-65 MG PO TABS
2.0000 | ORAL_TABLET | Freq: Four times a day (QID) | ORAL | Status: DC | PRN
  Filled 2017-07-10: qty 2

## 2017-07-10 NOTE — Telephone Encounter (Signed)
Received a call from [place] PA from ED requesting to transfer patient to the day infusion center. Paitent is complaining of RUQ pain with 8/10 pain. No abdominal imaging has been performed. Patient was recently admitted due to elevated LFT and represents today with the same complaint. Refused admission of the patient due to day infusion policy due to RUQ pain is a criteria that makes patient ineligible for day hospital admission.  Carroll Sage. Kenton Kingfisher, MSN, FNP-C [facility]  [name] [place], [place] [address] [phone]

## 2017-07-10 NOTE — ED Triage Notes (Signed)
Pt arrived to [facility] from home with complaints of chest and back pain. Pt states she was recently diagnosed with acute chest syndrome.

## 2017-07-10 NOTE — ED Provider Notes (Signed)
[facility] DEPT Provider Note   CSN: [id number] Arrival date & time: 07/10/17  0531     History   Chief Complaint Chief Complaint  Patient presents with  . Sickle Cell Pain Crisis    HPI [name] is a 27 y.o. female.  [name] is a 27 y.o. Female history of sickle cell disease with hepatic sequestration, GERD, depression, who presents to the emergency department for pain in her back and right upper quadrant pain that radiates into the chest she reports that this pain began yesterday and has not improved with use of her home pain medication.  Patient reports she was recently admitted, discharged on 2/22, during this hospitalization she developed right upper quadrant pain CT scan was done which showed increase in liver size could suggestive of worsening hepatic sequestration causing her pain, patient reports this pain resolved at discharge and she has been doing well at home but started again yesterday.  She describes pain as intense right upper quadrant pain with popping and crackling sensation that radiates up into the chest and around to the right side of the back.  Patient reports pain is constant and severe.  She reports this pain makes it painful to breathe.  Patient denies fevers or chills, no cough.  Patient denies nausea, vomiting or diarrhea, no abdominal pain other than right upper quadrant.  No headaches or vision changes, no pain in the extremities, red hot or swollen joints.      Past Medical History:  Diagnosis Date  . Blood transfusion    "lots"  . Blood transfusion without reported diagnosis   . Chronic back pain    "very severe; have knot in my back; from tight muscle; take RX and exercise for it"  . Depression 01/06/2011  . Exertional dyspnea    "sometimes"  . Genital HSV   . GERD (gastroesophageal reflux disease) 02/17/2011  . Migraines 11/08/11   "@ least twice/month"  . Miscarriage 03/22/2011   Pt reports 2  miscarriages.  . Mood swings 11/08/11   "I go back and forth; real bad"  . Sickle cell anemia ([name])   . Sickle cell anemia with crisis ([place])   . Thrombocytosis ([place]) 11/09/2015  . Trichotillomania    h/o    Patient Active Problem List   Diagnosis Date Noted  . Menorrhagia   . Chronic anticoagulation   . Sickle cell anemia ([place]) 05/19/2016  . History of DVT (deep vein thrombosis) 04/17/2016  . Thrombocytosis ([place]) 11/09/2015  . Hyperbilirubinemia 11/09/2015  . Chronic pain 08/04/2015  . Herpes simplex 07/14/2015  . Anemia of chronic disease   . 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 CHOLECYSTECTOMY  05/2010  . DILATION AND CURETTAGE OF UTERUS  02/20/11   S/P miscarriage  . IR GENERIC HISTORICAL  12/23/2015   IR FLUORO GUIDE CV LINE RIGHT 12/23/2015 Jacqulynn Cadet, MD [facility]INTER[facility]  . IR GENERIC HISTORICAL  12/23/2015   IR US GUIDE VASC ACCESS RIGHT 12/23/2015 Jacqulynn Cadet, MD [facility]    OB History    Gravida Para Term Preterm AB Living   5 2 2  0 3 2   SAB TAB Ectopic Multiple Live Births   3 0 0 0 2      Obstetric Comments   Miscarried in October 2012 at about 7 weeks  Home Medications    Prior to Admission medications   Medication Sig Start Date End Date Taking? Authorizing Provider  aspirin-acetaminophen-caffeine (EXCEDRIN MIGRAINE) [phone] MG tablet Take 2 tablets every 6 (six) hours as needed by mouth for headache.    [provider]  Cyanocobalamin-Caffeine (ENERGY CHEWS PO) Take 2 tablets daily by mouth.    [provider]  hydroxyurea (HYDREA) 500 MG capsule Take 2 tabs (1000 mg ) PO QOD starting on 06/02/2017 alternating with 1 tab (500 mg) PO QOD starting on 06/03/2017. May take with food to minimize GI side effects. 06/01/17   [name], MD  medroxyPROGESTERone (DEPO-PROVERA) 150 MG/ML  injection Inject 1 mL (150 mg total) into the muscle every 3 (three) months. 09/21/16   Shelly Bombard, MD  morphine (MS CONTIN) 30 MG 12 hr tablet Take 30 mg by mouth every 12 (twelve) hours.    [provider]  Multiple Vitamin (MULTIVITAMIN WITH MINERALS) TABS tablet Take 1 tablet daily by mouth.    [provider]  Oxycodone HCl 20 MG TABS Take 20 mg every 4 (four) hours as needed by mouth (pain).  01/17/17   [provider]  rivaroxaban (XARELTO) 20 MG TABS tablet Take 1 tablet (20 mg total) by mouth daily with supper. 04/27/17   Scot Jun, FNP    Family History Family History  Problem Relation Age of Onset  . Sickle cell trait Mother   . Sickle cell trait Father   . Diabetes Maternal Grandmother   . Diabetes Paternal Grandmother   . Hypertension Paternal Grandmother   . Diabetes Maternal Grandfather     Social History Social History   Tobacco Use  . Smoking status: Former Smoker    Packs/day: 0.25    Years: 1.00    Pack years: 0.25    Types: Cigarettes    Last attempt to quit: 03/25/2013    Years since quitting: 4.2  . Smokeless tobacco: Never Used  Substance Use Topics  . Alcohol use: No    Comment: denies  . Drug use: No    Comment: "Clean for 3 months"     Allergies   Food and Latex   Review of Systems Review of Systems  Constitutional: Negative for chills and fever.  HENT: Negative for congestion, rhinorrhea and sore throat.   Eyes: Negative for visual disturbance.  Respiratory: Positive for chest tightness. Negative for cough, shortness of breath and wheezing.   Cardiovascular: Positive for chest pain. Negative for palpitations and leg swelling.  Gastrointestinal: Positive for abdominal pain. Negative for blood in stool, diarrhea, nausea and vomiting.  Genitourinary: Negative for dysuria.  Musculoskeletal: Positive for back pain and myalgias. Negative for arthralgias, gait problem, joint swelling, neck pain and neck  stiffness.  Skin: Negative for color change and rash.  Neurological: Negative for facial asymmetry, speech difficulty, weakness, numbness and headaches.     Physical Exam Updated Vital Signs BP 109/75 (BP Location: Right Arm)   Pulse 98   Temp 98.8 F (37.1 C) (Oral)   Resp 20   Ht 5\' 1"  (1.549 m)   Wt 95.7 kg (211 lb)   LMP 06/29/2017   SpO2 98%   BMI 39.87 kg/m   Physical Exam  Constitutional: She appears well-developed and well-nourished. No distress.  Patient appears uncomfortable but is in no acute distress  HENT:  Head: Normocephalic and atraumatic.  Eyes: EOM are normal. Pupils are equal, round, and reactive to light. Right eye exhibits no discharge.  Left eye exhibits no discharge.  Neck: Neck supple.  Cardiovascular: Normal rate, regular rhythm, normal heart sounds and intact distal pulses.  Pulses:      Radial pulses are 2+ on the right side, and 2+ on the left side.       Dorsalis pedis pulses are 2+ on the right side, and 2+ on the left side.       Posterior tibial pulses are 2+ on the right side, and 2+ on the left side.  Pulmonary/Chest: Breath sounds normal. No stridor. No respiratory distress. She has no wheezes. She has no rales.  Poor inspiratory effort from patient due to pain, lungs are clear to auscultation bilaterally with good air movement, patient able to speak in full sentences comfortably  Abdominal: Soft. Bowel sounds are normal. She exhibits no mass. There is tenderness. There is guarding. There is no rebound.  Abdomen soft, nondistended with bowel sounds present in all quadrants, right upper quadrant tenderness even to soft palpation with mild guarding voluntary guarding, all other quadrants nontender to palpation without peritoneal signs  Musculoskeletal:  Tenderness over the right side of the upper back extending around from the right upper quadrant, no midline spinal tenderness, no focal bony tenderness.  All joints supple, easily movable without  erythema warmth, compartments soft  Neurological: She is alert. Coordination normal.  Moving all extremities without difficulty  Skin: Skin is warm and dry. Capillary refill takes less than 2 seconds. She is not diaphoretic.  Psychiatric: She has a normal mood and affect. Her behavior is normal.  Nursing note and vitals reviewed.    ED Treatments / Results  Labs (all labs ordered are listed, but only abnormal results are displayed) Labs Reviewed  COMPREHENSIVE METABOLIC PANEL - Abnormal; Notable for the following components:      Result Value   Glucose, Bld 105 (*)    Total Protein 8.2 (*)    ALT 61 (*)    Alkaline Phosphatase 134 (*)    Total Bilirubin 1.6 (*)    All other components within normal limits  CBC WITH DIFFERENTIAL/PLATELET - Abnormal; Notable for the following components:   WBC 14.2 (*)    RBC 3.09 (*)    Hemoglobin 8.5 (*)    HCT 25.5 (*)    RDW 19.2 (*)    Platelets 842 (*)    Neutro Abs 11.4 (*)    All other components within normal limits  RETICULOCYTES - Abnormal; Notable for the following components:   Retic Ct Pct 11.6 (*)    RBC. 3.09 (*)    Retic Count, Absolute 358.4 (*)    All other components within normal limits  I-STAT BETA HCG BLOOD, ED ([facility], [facility], [facility] ONLY) - Abnormal; Notable for the following components:   I-stat hCG, quantitative 5.5 (*)    All other components within normal limits  I-STAT TROPONIN, ED    EKG  EKG Interpretation None       Radiology Dg Chest 2 View  Result Date: 07/10/2017 CLINICAL DATA:  Chest pain.  History of sickle cell disease. EXAM: CHEST  2 VIEW COMPARISON:  07/05/2017 FINDINGS: Porta catheter in good position with tip at the distal SVC. There is no edema, consolidation, effusion, or pneumothorax. Normal heart size and mediastinal contours. 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 IMPRESSION: No evidence of active disease. Lower lobe opacities on prior are no longer seen. Electronically  Signed   By: Monte Fantasia M.D.   On: 07/10/2017 06:52  Procedures Procedures (including critical care time)  Medications Ordered in ED Medications  dextrose 5 %-0.45 % sodium chloride infusion ( Intravenous New Bag/Given 07/10/17 1441)  promethazine (PHENERGAN) tablet 25 mg (25 mg Oral Given 07/10/17 0648)  morphine (MS CONTIN) 12 hr tablet 30 mg (30 mg Oral Given 07/10/17 1432)  ketorolac (TORADOL) 30 MG/ML injection 30 mg (30 mg Intravenous Given 07/10/17 0640)  diphenhydrAMINE (BENADRYL) injection 25 mg (25 mg Intravenous Given 07/10/17 0641)  HYDROmorphone (DILAUDID) injection 2 mg (2 mg Intravenous Given 07/10/17 0641)    Or  HYDROmorphone (DILAUDID) injection 2 mg ( Subcutaneous See Alternative 07/10/17 0641)  HYDROmorphone (DILAUDID) injection 2 mg (2 mg Intravenous Given 07/10/17 0714)    Or  HYDROmorphone (DILAUDID) injection 2 mg ( Subcutaneous See Alternative 07/10/17 0714)  HYDROmorphone (DILAUDID) injection 2 mg (2 mg Intravenous Given 07/10/17 0846)    Or  HYDROmorphone (DILAUDID) injection 2 mg ( Subcutaneous See Alternative 07/10/17 0846)  diphenhydrAMINE (BENADRYL) injection 25 mg (25 mg Intravenous Given 07/10/17 0906)     Initial Impression / Assessment and Plan / ED Course  I have reviewed the triage vital signs and the nursing notes.  Pertinent labs & imaging results that were available during my care of the patient were reviewed by me and considered in my medical decision making (see chart for details).  Patient presents for evaluation of pain in the back as well as right upper quadrant pain radiating to the chest.  During recent admission patient noted to have worsening hepatic sequestration likely the cause of patient's right upper quadrant pain, this had initially resolved but has since returned.  Not responding to home pain medications.  Chest pain seems to be more so radiating from right upper quadrant pain patient without fevers or cough I have a low suspicion for  acute sepsis syndrome will check EKG, troponin and chest x-ray in addition to typical screening labs.  Will start patient on sickle cell pain regimen and reevaluate.  Liver function studies are reassuring, all showing improvement since recent hospitalization.  Troponin negative, EKG shows normal sinus rhythm without concerning changes, chest x-ray shows no acute cardiopulmonary disease, previous lower lobe opacities noted have resolved.  Hemoglobin is at patient's baseline, white count is 14.2 just slightly elevated from few days prior when patient was hospitalized.  No acute electrolyte derangements and normal kidney function.  Reticulocyte count at baseline as well.   Initially tried to send the patient over to the sickle cell center, spoke with FNP Molli Barrows who initially said that if Dr. Jeannie Fend was okay with the patient coming over she could be brought to the sickle cell unit for continued pain management I spoke with Dr. Jeannie Fend who saw the patient briefly this morning and felt that this would be fine, after speaking with Molli Barrows again, she felt that with the patient's right upper quadrant pain and no additional imaging the patient would not be a candidate to come to the sickle cell center despite improvement of labs since admission with imaging just a few days prior.  Will continue with pain management here and reevaluate patient after 3 doses of pain medication.  On reevaluation after pain medication she reports pain remains at an 8/10 with little improvement, patient wishes to be admitted to the hospital.  Spoke with Dr. Jonelle Sidle who will see and admit the patient.   Final Clinical Impressions(s) / ED Diagnoses   Final diagnoses:  Sickle cell pain crisis ([name])  RUQ pain  Hepatomegaly  ED Discharge Orders    None       Jacqlyn Larsen, [place] 07/10/17 1449    Orpah Greek, MD 07/11/17 [phone]

## 2017-07-10 NOTE — ED Notes (Signed)
ED TO INPATIENT HANDOFF REPORT  Name/Age/Gender [name] 27 y.o. female  Code Status    Code Status Orders  (From admission, onward)        Start     Ordered   07/10/17 1947  Full code  Continuous     07/10/17 1946    Code Status History    Date Active Date Inactive Code Status Order ID Comments User Context   07/02/2017 08:48 07/07/2017 20:44 Full Code [id number]  Elwyn Reach, MD Inpatient   05/27/2017 21:39 06/01/2017 19:37 Full Code [id number]  Etta Quill, DO ED   05/04/2017 21:05 05/07/2017 21:27 Full Code [id number]  Rise Patience, MD ED   04/01/2017 18:13 04/03/2017 18:41 Full Code [id number]  Aline August, MD ED   02/28/2017 00:45 03/05/2017 21:33 Full Code [id number]  Etta Quill, DO ED   02/02/2017 03:09 02/04/2017 22:50 Full Code [id number]  Bethena Roys, MD Inpatient   12/10/2016 04:03 12/14/2016 17:05 Full Code [id number]  Etta Quill, DO ED   11/06/2016 20:39 11/11/2016 16:59 Full Code [id number]  Patrecia Pour, MD Inpatient   10/06/2016 16:47 10/09/2016 18:09 Full Code [id number]  [name], MD ED   10/02/2016 15:12 10/05/2016 10:42 Full Code [id number]  Tresa Garter, MD Inpatient   09/15/2016 17:23 09/19/2016 20:25 Full Code [id number]  Dorena Dew, [place] Inpatient   08/20/2016 18:12 08/30/2016 16:13 Full Code [id number]  [name], MD Inpatient   07/25/2016 23:20 07/29/2016 21:26 Full Code [id number]  Norval Morton, MD ED   06/26/2016 21:54 07/02/2016 19:54 Full Code [id number]  Etta Quill, DO ED   06/10/2016 05:11 06/15/2016 15:11 Full Code [id number]  Etta Quill, DO ED   05/29/2016 20:07 05/31/2016 18:03 Full Code [id number]  Cristal Ford, DO Inpatient   05/19/2016 19:14 05/25/2016 20:05 Full Code [id number]  Charlynne Cousins, MD Inpatient   04/24/2016 10:59 05/02/2016 17:50 Full Code [id number]  Elwyn Reach, MD Inpatient   04/17/2016 21:05 04/20/2016 19:13 Full Code [id number]  Reubin Milan, MD  Inpatient   04/17/2016 21:05 04/17/2016 21:05 Full Code [id number]  Reubin Milan, MD Inpatient   03/31/2016 16:38 04/07/2016 18:08 Full Code [id number]  [name], MD Inpatient   02/26/2016 18:59 03/03/2016 21:04 Full Code [id number]  [name], MD Inpatient   02/16/2016 10:33 02/17/2016 11:01 Full Code [id number]  Tresa Garter, MD Inpatient   01/30/2016 07:51 02/05/2016 19:04 Full Code [id number]  Ivor Costa, MD ED   01/05/2016 08:37 01/09/2016 17:27 Full Code [id number]  Tresa Garter, MD Inpatient   12/27/2015 03:58 12/29/2015 20:09 Full Code [id number]  Ivor Costa, MD ED   11/09/2015 01:18 11/12/2015 18:37 Full Code [id number]  Norval Morton, MD ED   10/31/2015 18:54 11/02/2015 14:56 Full Code [id number]  Manya Silvas, [place] Inpatient   10/30/2015 14:46 10/31/2015 18:54 Full Code [id number]  Lavonia Drafts, MD Inpatient   10/26/2015 21:25 10/30/2015 14:46 Full Code [id number]  Gwynne Edinger, MD Inpatient   10/15/2015 18:57 10/18/2015 20:27 Full Code [id number]  [name], MD Inpatient   10/06/2015 22:58 10/10/2015 16:23 Full Code [id number]  Lily Kocher, MD ED   09/22/2015 20:26 09/27/2015 17:35 Full Code [id number]  Osborne Oman, MD Inpatient   09/07/2015 14:31 09/14/2015 14:54 Full Code [id number]  [name], MD Inpatient   08/10/2015 14:05 08/17/2015 16:38 Full Code [id number]  [name], MD Inpatient   07/24/2015 05:31 08/02/2015 18:17 Full  Code [id number]  Carlyle Dolly, MD Inpatient   07/24/2015 04:47 07/24/2015 05:31 Full Code [id number]  Carlyle Dolly, MD Inpatient   07/24/2015 03:53 07/24/2015 04:47 Full Code [id number]  Carlyle Dolly, MD Inpatient   07/12/2015 02:14 07/14/2015 20:28 Full Code [id number]  Ivor Costa, MD ED   06/28/2015 10:37 07/03/2015 14:41 Full Code [id number]  [name], MD Inpatient   05/31/2015 14:59 06/05/2015 20:49 Full Code [id number]  [name], MD ED   05/02/2015 16:39  05/08/2015 16:44 Full Code [id number]  [name], MD Inpatient   04/14/2015 17:41 04/22/2015 20:06 Full Code [id number]  Tresa Garter, MD Inpatient   04/14/2015 17:41 04/14/2015 17:41 Full Code [id number]  Tresa Garter, MD Inpatient   04/14/2015 11:53 04/14/2015 17:41 Full Code [id number]  Tresa Garter, MD Inpatient   03/30/2015 11:04 04/03/2015 17:42 Full Code [id number]  Tresa Garter, MD Inpatient   03/22/2015 20:42 03/26/2015 17:47 Full Code [id number]  Reubin Milan, MD ED   01/25/2015 13:23 01/27/2015 15:54 Full Code [id number]  Elwyn Reach, MD Inpatient   12/07/2014 15:57 12/10/2014 17:20 Full Code [id number]  Keitha Butte, CNM Inpatient   12/05/2014 09:20 12/07/2014 15:57 Full Code [id number]  Tami Ribas, RN Inpatient   11/26/2014 22:16 12/01/2014 20:33 Full Code [id number]  Virginia Crews, MD Inpatient   11/19/2014 01:29 11/21/2014 18:12 Full Code [id number]  Seabron Spates, [place] Inpatient   10/23/2014 21:18 10/30/2014 19:32 Full Code [id number]  Truett Mainland, DO Inpatient   10/11/2014 00:10 10/14/2014 17:07 Full Code [id number]  Larey Days, [place] Inpatient   09/18/2014 15:45 09/26/2014 16:56 Full Code [id number]  Lavonia Drafts, MD Inpatient   09/08/2014 01:17 09/10/2014 18:21 Full Code [id number]  Lavonia Drafts, MD Inpatient   09/08/2014 01:17 09/08/2014 01:17 Full Code [id number]  Lavonia Drafts, MD Inpatient   08/09/2014 21:37 08/15/2014 20:48 Full Code [id number]  Tresea Mall, CNM Inpatient   07/14/2014 03:01 07/16/2014 16:53 Full Code [id number]  Brantley Persons, RN Inpatient   06/25/2014 16:51 06/28/2014 17:05 Full Code [id number]  [name], MD Inpatient   06/02/2014 01:06 06/05/2014 19:36 Full Code [id number]  Elberta Leatherwood, MD Inpatient   05/03/2014 18:18 05/06/2014 21:58 Full Code [id number]  Markus Daft, MD Inpatient   05/02/2014 12:07 05/03/2014 18:18 Full Code [id number]  [name], MD ED    04/10/2014 16:41 04/18/2014 21:17 Full Code [id number]  Greggory Keen, MD Inpatient   04/09/2014 10:28 04/10/2014 16:41 Full Code [id number]  Theodis Blaze, MD Inpatient   02/16/2014 20:20 02/25/2014 14:11 Full Code [id number]  Caren Griffins, MD ED   01/29/2014 03:43 02/01/2014 14:42 Full Code [id number]  Rise Patience, MD Inpatient   01/13/2014 18:15 01/16/2014 13:04 Full Code [id number]  Robbie Lis, MD Inpatient   11/25/2013 14:37 11/30/2013 20:09 Full Code [id number]  Verlee Monte, MD Inpatient   09/24/2013 06:15 09/27/2013 15:32 Full Code [id number]  Rise Patience, MD Inpatient   07/31/2013 03:12 08/04/2013 20:47 Full Code [id number]  Etta Quill, DO ED   07/16/2013 09:07 07/16/2013 23:10 Full Code [id number]  [name], MD Inpatient   05/28/2013 05:15 06/01/2013 21:17 Full Code [id number]  Berle Mull, MD ED   03/23/2013 04:30 03/29/2013 19:37 Full Code [id number]  Theressa Millard, MD ED   02/11/2013 08:03 02/14/2013 16:39 Full Code [id number]  Rise Patience, MD Inpatient   01/27/2013 12:57 01/30/2013 18:53 Full Code [id number]  Domenic Polite, MD  Inpatient   10/05/2012 21:54 10/12/2012 21:07 Full Code [id number]  Theressa Millard, MD ED   08/27/2012 19:55 08/31/2012 18:33 Full Code [id number]  Thurnell Lose, MD Inpatient   06/22/2012 14:36 06/27/2012 14:57 Full Code [id number]  Jorene Guest, RN Inpatient   03/28/2012 21:22 04/04/2012 19:29 Full Code [id number]  Verl Dicker, PA-C ED   03/24/2012 04:32 03/25/2012 11:44 Full Code [id number]  Martie Lee, [place] ED   03/04/2012 04:48 03/15/2012 22:58 Full Code [id number]  Kennith Center, RN Inpatient   02/13/2012 06:47 02/17/2012 01:24 Full Code [id number]  Jacqualyn Posey, RN Inpatient   12/17/2011 16:14 12/22/2011 17:16 Full Code [id number]  Theodis Blaze, MD Inpatient   12/16/2011 22:05 12/17/2011 16:14 Full Code [id number]  Threasa Beards, MD ED   11/30/2011 03:09 12/03/2011 17:45 Full Code [id number]  Evelina Dun, RN ED    11/08/2011 07:23 11/14/2011 18:23 Full Code [id number]  Ron Parker, RN ED   09/05/2011 02:04 09/05/2011 07:32 Full Code [id number]  Garald Balding, NP ED   08/05/2011 07:55 08/05/2011 18:52 Full Code [id number]  Rainey Pines, RN ED   08/05/2011 00:28 08/05/2011 07:55 Full Code [id number]  Charlena Cross, MD ED   07/13/2011 11:49 07/15/2011 18:47 Full Code [id number]  Julieta Bellini, NP ED   06/09/2011 00:48 06/09/2011 09:59 Full Code [id number]  Charlena Cross, MD ED   05/31/2011 18:28 06/01/2011 18:37 Full Code [id number]  Jenel Lucks, RN Inpatient   05/26/2011 02:18 05/26/2011 14:19 Full Code [id number]  Molpus, Karen Chafe, MD ED   05/06/2011 07:00 05/08/2011 16:09 Full Code [id number]  Piloto de Tressia Miners, MD Inpatient   04/17/2011 13:29 04/19/2011 16:50 Full Code [id number]  Judithann Sheen, MD ED   03/30/2011 00:39 04/03/2011 00:25 Full Code [id number]  Hoy Morn, MD ED      Home/SNF/Other Home    Chief Complaint sickle cell pain crisis  Level of Care/Admitting Diagnosis ED Disposition    ED Disposition Condition [facility] Area: [facility] [[id number]]  Level of Care: Med-Surg [16]  Diagnosis: Sickle cell crisis [facility]) [[id number]]  Admitting Physician: Elwyn Reach [[id number]]  Attending Physician: Elwyn Reach [[id number]]  Estimated length of stay: past midnight tomorrow  Certification:: I certify this patient will need inpatient services for at least 2 midnights  PT Class (Do Not Modify): Inpatient [101]  PT Acc Code (Do Not Modify): Private [1]       Medical History Past Medical History:  Diagnosis Date  . Blood transfusion    "lots"  . Blood transfusion without reported diagnosis   . Chronic back pain    "very severe; have knot in my back; from tight muscle; take RX and exercise for it"  . Depression 01/06/2011  . Exertional dyspnea    "sometimes"  . Genital HSV   . GERD (gastroesophageal reflux disease) 02/17/2011  . Migraines  11/08/11   "@ least twice/month"  . 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  . Mood swings 11/08/11   "I go back and forth; real bad"  . Sickle cell anemia ([name])   . Sickle cell anemia with crisis ([place])   . Thrombocytosis ([place]) 11/09/2015  . Trichotillomania    h/o    Allergies Allergies  Allergen Reactions  . Food Hives and Other (See Comments)    Pt is allergic to carrots.    . Latex Rash    IV Location/Drains/Wounds  Patient Lines/Drains/Airways Status   Active Line/Drains/Airways    Name:   Placement date:   Placement time:   Site:   Days:   Implanted Port Right Chest   -    -    Chest             Labs/Imaging Results for orders placed or performed during the hospital encounter of 07/10/17 (from the past 48 hour(s))  Comprehensive metabolic panel     Status: Abnormal   Collection Time: 07/10/17  6:00 AM  Result Value Ref Range   Sodium 139 135 - 145 mmol/L   Potassium 4.0 3.5 - 5.1 mmol/L   Chloride 104 101 - 111 mmol/L   CO2 23 22 - 32 mmol/L   Glucose, Bld 105 (H) 65 - 99 mg/dL   BUN 7 6 - 20 mg/dL   Creatinine, Ser 0.62 0.44 - 1.00 mg/dL   Calcium 9.2 8.9 - 10.3 mg/dL   Total Protein 8.2 (H) 6.5 - 8.1 g/dL   Albumin 3.6 3.5 - 5.0 g/dL   AST 20 15 - 41 U/L   ALT 61 (H) 14 - 54 U/L   Alkaline Phosphatase 134 (H) 38 - 126 U/L   Total Bilirubin 1.6 (H) 0.3 - 1.2 mg/dL   GFR calc non Af Amer >60 >60 mL/min   GFR calc Af Amer >60 >60 mL/min    Comment: (NOTE) The eGFR has been calculated using the CKD EPI equation. This calculation has not been validated in all clinical situations. eGFR's persistently <60 mL/min signify possible Chronic Kidney Disease.    Anion gap 12 5 - 15    Comment: Performed at [facility], [place] [address], [place], [place] [address]  CBC with Differential     Status: Abnormal   Collection Time: 07/10/17  6:00 AM  Result Value Ref Range   WBC 14.2 (H) 4.0 - 10.5 K/uL   RBC 3.09 (L) 3.87 - 5.11 MIL/uL    Hemoglobin 8.5 (L) 12.0 - 15.0 g/dL   HCT 25.5 (L) 36.0 - 46.0 %   MCV 82.5 78.0 - 100.0 fL   MCH 27.5 26.0 - 34.0 pg   MCHC 33.3 30.0 - 36.0 g/dL   RDW 19.2 (H) 11.5 - 15.5 %   Platelets 842 (H) 150 - 400 K/uL   Neutrophils Relative % 81 %   Lymphocytes Relative 11 %   Monocytes Relative 6 %   Eosinophils Relative 2 %   Basophils Relative 0 %   Neutro Abs 11.4 (H) 1.7 - 7.7 K/uL   Lymphs Abs 1.6 0.7 - 4.0 K/uL   Monocytes Absolute 0.9 0.1 - 1.0 K/uL   Eosinophils Absolute 0.3 0.0 - 0.7 K/uL   Basophils Absolute 0.0 0.0 - 0.1 K/uL   RBC Morphology POLYCHROMASIA PRESENT     Comment: TARGET CELLS HOWELL/JOLLY BODIES Sickle cells present Performed at [facility], [place] [address] [place], [place] [address]   Reticulocytes     Status: Abnormal   Collection Time: 07/10/17  6:00 AM  Result Value Ref Range   Retic Ct Pct 11.6 (H) 0.4 - 3.1 %   RBC. 3.09 (L) 3.87 - 5.11 MIL/uL   Retic Count, Absolute 358.4 (H) 19.0 - 186.0 K/uL    Comment: Performed at [facility], [place] [address], [place], [place] [address]  I-Stat beta hCG blood, ED     Status: Abnormal   Collection Time: 07/10/17  6:11 AM  Result Value Ref  Range   I-stat hCG, quantitative 5.5 (H) <5 mIU/mL   Comment 3            Comment:   GEST. AGE      CONC.  (mIU/mL)   <=1 WEEK        5 - 50     2 WEEKS       50 - 500     3 WEEKS       100 - 10,000     4 WEEKS     1,000 - 30,000        FEMALE AND NON-PREGNANT FEMALE:     LESS THAN 5 mIU/mL   I-stat troponin, ED     Status: None   Collection Time: 07/10/17  6:12 AM  Result Value Ref Range   Troponin i, poc 0.00 0.00 - 0.08 ng/mL   Comment 3            Comment: Due to the release kinetics of cTnI, a negative result within the first hours of the onset of symptoms does not rule out myocardial infarction with certainty. If myocardial infarction is still suspected, repeat the test at appropriate intervals.    *Note: Due to a large  number of results and/or encounters for the requested time period, some results have not been displayed. A complete set of results can be found in Results Review.   Dg Chest 2 View  Result Date: 07/10/2017 CLINICAL DATA:  Chest pain.  History of sickle cell disease. EXAM: CHEST  2 VIEW COMPARISON:  07/05/2017 FINDINGS: Porta catheter in good position with tip at the distal SVC. There is no edema, consolidation, effusion, or pneumothorax. Normal heart size and mediastinal contours. 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 IMPRESSION: No evidence of active disease. Lower lobe opacities on prior are no longer seen. Electronically Signed   By: Monte Fantasia M.D.   On: 07/10/2017 06:52    Pending Labs Unresulted Labs (From admission, onward)   Start     Ordered   07/11/17 0500  Comprehensive metabolic panel  Tomorrow morning,   R     07/10/17 1946   07/11/17 0500  CBC with Differential/Platelet  Tomorrow morning,   R     07/10/17 1946   07/10/17 1947  Reticulocytes  Once,   R     07/10/17 1946      Vitals/Pain Today's Vitals   07/10/17 1735 07/10/17 1841 07/10/17 2016 07/10/17 2026  BP: 100/66 108/68  (!) 105/50  Pulse: 78 72  90  Resp: 16 16  16   Temp:    98.4 F (36.9 C)  TempSrc:    Oral  SpO2: 100% 100%  97%  Weight:      Height:      PainSc:   8      Isolation Precautions No active isolations  Medications Medications  dextrose 5 %-0.45 % sodium chloride infusion ( Intravenous New Bag/Given 07/10/17 1441)  promethazine (PHENERGAN) tablet 25 mg (25 mg Oral Given 07/10/17 2694)  medroxyPROGESTERone (DEPO-PROVERA) injection 150 mg (not administered)  aspirin-acetaminophen-caffeine (EXCEDRIN MIGRAINE) per tablet 2 tablet (not administered)  rivaroxaban (XARELTO) tablet 20 mg (not administered)  hydroxyurea (HYDREA) capsule 1,000 mg (not administered)  morphine (MS CONTIN) 12 hr tablet 30 mg (30 mg Oral Given 07/10/17 1432)  senna-docusate (Senokot-S)  tablet 1 tablet (not administered)  polyethylene glycol (MIRALAX / GLYCOLAX) packet 17 g (not administered)  naloxone (NARCAN) injection 0.4 mg (not administered)  And  sodium chloride flush (NS) 0.9 % injection 9 mL (not administered)  ondansetron (ZOFRAN) injection 4 mg (not administered)  diphenhydrAMINE (BENADRYL) capsule 25 mg (not administered)    Or  diphenhydrAMINE (BENADRYL) 25 mg in sodium chloride 0.9 % 50 mL IVPB (not administered)  ketorolac (TORADOL) 30 MG/ML injection 30 mg (not administered)  HYDROmorphone (DILAUDID) 1 mg/mL PCA injection ( Intravenous Hold 07/10/17 2014)  HYDROmorphone (DILAUDID) injection 2 mg (2 mg Intravenous Given 07/10/17 1956)  ketorolac (TORADOL) 30 MG/ML injection 30 mg (30 mg Intravenous Given 07/10/17 0640)  diphenhydrAMINE (BENADRYL) injection 25 mg (25 mg Intravenous Given 07/10/17 0641)  HYDROmorphone (DILAUDID) injection 2 mg (2 mg Intravenous Given 07/10/17 0641)    Or  HYDROmorphone (DILAUDID) injection 2 mg ( Subcutaneous See Alternative 07/10/17 0641)  HYDROmorphone (DILAUDID) injection 2 mg (2 mg Intravenous Given 07/10/17 0714)    Or  HYDROmorphone (DILAUDID) injection 2 mg ( Subcutaneous See Alternative 07/10/17 0714)  HYDROmorphone (DILAUDID) injection 2 mg (2 mg Intravenous Given 07/10/17 0846)    Or  HYDROmorphone (DILAUDID) injection 2 mg ( Subcutaneous See Alternative 07/10/17 0846)  diphenhydrAMINE (BENADRYL) injection 25 mg (25 mg Intravenous Given 07/10/17 0906)    Mobility {Mobility:[id number]

## 2017-07-10 NOTE — ED Notes (Signed)
Pain was 10 out of 10 on assessment  Pain meds given  Pain now 8 out 10

## 2017-07-10 NOTE — H&P (Signed)
[name] is an 27 y.o. female.   Chief Complaint: pain in back and legs HPI: patient is 27 year old female with known sickle cell disease here with  Pain in her back and legs as well as right upper quadrant pain. Pain is rated as 9 out of 10..  Patient has used home medication  Without relief.   The pain has persisted  In the ER with up to 6 mg of Dilaudid given but no relief.  She has some nausea but no vomiting  Initial evaluation included CT abdomen and pelvis showing no acute findings.  Symptoms are consistent with known sickle cell crisis.  Past Medical History:  Diagnosis Date  . Blood transfusion    "lots"  . Blood transfusion without reported diagnosis   . Chronic back pain    "very severe; have knot in my back; from tight muscle; take RX and exercise for it"  . Depression 01/06/2011  . Exertional dyspnea    "sometimes"  . Genital HSV   . GERD (gastroesophageal reflux disease) 02/17/2011  . Migraines 11/08/11   "@ least twice/month"  . 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  . Mood swings 11/08/11   "I go back and forth; real bad"  . Sickle cell anemia ([name])   . Sickle cell anemia with crisis ([place])   . Thrombocytosis ([place]) 11/09/2015  . Trichotillomania    h/o    Past Surgical History:  Procedure Laterality Date  . CHOLECYSTECTOMY  05/2010  . DILATION AND CURETTAGE OF UTERUS  02/20/11   S/P miscarriage  . IR GENERIC HISTORICAL  12/23/2015   IR FLUORO GUIDE CV LINE RIGHT 12/23/2015 Jacqulynn Cadet, MD [facility]  . IR GENERIC HISTORICAL  12/23/2015   IR US GUIDE VASC ACCESS RIGHT 12/23/2015 Jacqulynn Cadet, MD [facility]    Family History  Problem Relation Age of Onset  . Sickle cell trait Mother   . Sickle cell trait Father   . Diabetes Maternal Grandmother   . Diabetes Paternal Grandmother   . Hypertension Paternal Grandmother   . Diabetes Maternal Grandfather    Social History:  reports that she quit smoking about 4 years ago. Her smoking use  included cigarettes. She has a 0.25 pack-year smoking history. she has never used smokeless tobacco. She reports that she does not drink alcohol or use drugs.  Allergies:  Allergies  Allergen Reactions  . Food Hives and Other (See Comments)    Pt is allergic to carrots.    . Latex Rash     (Not in a hospital admission)  Results for orders placed or performed during the hospital encounter of 07/10/17 (from the past 48 hour(s))  Comprehensive metabolic panel     Status: Abnormal   Collection Time: 07/10/17  6:00 AM  Result Value Ref Range   Sodium 139 135 - 145 mmol/L   Potassium 4.0 3.5 - 5.1 mmol/L   Chloride 104 101 - 111 mmol/L   CO2 23 22 - 32 mmol/L   Glucose, Bld 105 (H) 65 - 99 mg/dL   BUN 7 6 - 20 mg/dL   Creatinine, Ser 0.62 0.44 - 1.00 mg/dL   Calcium 9.2 8.9 - 10.3 mg/dL   Total Protein 8.2 (H) 6.5 - 8.1 g/dL   Albumin 3.6 3.5 - 5.0 g/dL   AST 20 15 - 41 U/L   ALT 61 (H) 14 - 54 U/L   Alkaline Phosphatase 134 (H) 38 - 126 U/L   Total Bilirubin  1.6 (H) 0.3 - 1.2 mg/dL   GFR calc non Af Amer >60 >60 mL/min   GFR calc Af Amer >60 >60 mL/min    Comment: (NOTE) The eGFR has been calculated using the CKD EPI equation. This calculation has not been validated in all clinical situations. eGFR's persistently <60 mL/min signify possible Chronic Kidney Disease.    Anion gap 12 5 - 15    Comment: Performed at [facility], [place] [address] [place], [place] [address]  CBC with Differential     Status: Abnormal   Collection Time: 07/10/17  6:00 AM  Result Value Ref Range   WBC 14.2 (H) 4.0 - 10.5 K/uL   RBC 3.09 (L) 3.87 - 5.11 MIL/uL   Hemoglobin 8.5 (L) 12.0 - 15.0 g/dL   HCT 25.5 (L) 36.0 - 46.0 %   MCV 82.5 78.0 - 100.0 fL   MCH 27.5 26.0 - 34.0 pg   MCHC 33.3 30.0 - 36.0 g/dL   RDW 19.2 (H) 11.5 - 15.5 %   Platelets 842 (H) 150 - 400 K/uL   Neutrophils Relative % 81 %   Lymphocytes Relative 11 %   Monocytes Relative 6 %   Eosinophils Relative  2 %   Basophils Relative 0 %   Neutro Abs 11.4 (H) 1.7 - 7.7 K/uL   Lymphs Abs 1.6 0.7 - 4.0 K/uL   Monocytes Absolute 0.9 0.1 - 1.0 K/uL   Eosinophils Absolute 0.3 0.0 - 0.7 K/uL   Basophils Absolute 0.0 0.0 - 0.1 K/uL   RBC Morphology POLYCHROMASIA PRESENT     Comment: TARGET CELLS HOWELL/JOLLY BODIES Sickle cells present Performed at [facility], [place] [address] [place], [place] [address]   Reticulocytes     Status: Abnormal   Collection Time: 07/10/17  6:00 AM  Result Value Ref Range   Retic Ct Pct 11.6 (H) 0.4 - 3.1 %   RBC. 3.09 (L) 3.87 - 5.11 MIL/uL   Retic Count, Absolute 358.4 (H) 19.0 - 186.0 K/uL    Comment: Performed at [facility], [place] [address], [place], [place] [address]  I-Stat beta hCG blood, ED     Status: Abnormal   Collection Time: 07/10/17  6:11 AM  Result Value Ref Range   I-stat hCG, quantitative 5.5 (H) <5 mIU/mL   Comment 3            Comment:   GEST. AGE      CONC.  (mIU/mL)   <=1 WEEK        5 - 50     2 WEEKS       50 - 500     3 WEEKS       100 - 10,000     4 WEEKS     1,000 - 30,000        FEMALE AND NON-PREGNANT FEMALE:     LESS THAN 5 mIU/mL   I-stat troponin, ED     Status: None   Collection Time: 07/10/17  6:12 AM  Result Value Ref Range   Troponin i, poc 0.00 0.00 - 0.08 ng/mL   Comment 3            Comment: Due to the release kinetics of cTnI, a negative result within the first hours of the onset of symptoms does not rule out myocardial infarction with certainty. If myocardial infarction is still suspected, repeat the test at appropriate intervals.    *Note: Due to a large  number of results and/or encounters for the requested time period, some results have not been displayed. A complete set of results can be found in Results Review.   Dg Chest 2 View  Result Date: 07/10/2017 CLINICAL DATA:  Chest pain.  History of sickle cell disease. EXAM: CHEST  2 VIEW COMPARISON:  07/05/2017 FINDINGS:  Porta catheter in good position with tip at the distal SVC. There is no edema, consolidation, effusion, or pneumothorax. Normal heart size and mediastinal contours. 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 IMPRESSION: No evidence of active disease. Lower lobe opacities on prior are no longer seen. Electronically Signed   By: Monte Fantasia M.D.   On: 07/10/2017 06:52    Review of Systems  Constitutional: Negative.   HENT: Negative.   Eyes: Negative.   Respiratory: Negative.   Cardiovascular: Negative.   Gastrointestinal: Positive for abdominal pain.  Genitourinary: Negative.   Musculoskeletal: Positive for joint pain, myalgias and neck pain.  Skin: Negative.   Neurological: Negative.   Endo/Heme/Allergies: Negative.   Psychiatric/Behavioral: Negative.     Blood pressure 96/66, pulse 85, temperature 98.8 F (37.1 C), temperature source Oral, resp. rate 13, height 5' 1"  (1.549 m), weight 95.7 kg (211 lb), last menstrual period 06/29/2017, SpO2 99 %, not currently breastfeeding. Physical Exam  Constitutional: She is oriented to person, place, and time. She appears well-developed and well-nourished.  HENT:  Head: Normocephalic.  Eyes: Conjunctivae and EOM are normal. Pupils are equal, round, and reactive to light.  Neck: Neck supple.  Cardiovascular: Normal rate, regular rhythm and normal heart sounds.  Respiratory: Effort normal and breath sounds normal.  GI: Soft. Bowel sounds are normal. There is no tenderness.  Musculoskeletal: Normal range of motion.  Neurological: She is alert and oriented to person, place, and time.  Skin: Skin is warm and dry.     Assessment/Plan A 27 year old female with sickle cell painful crisis #1 sickle cell painful crisis: Patient will be admitted for pain management.  Initiated Dilaudid PCA with Toradol  and IV fluids.  Continue long-acting MS Contin.  #2 sickle cell anemia: Patient has anemia of chronic disease.  Continue  monitoring H&H.  At the moment at baseline  #3 chronic pain syndrome: On chronic MS Contin.  Continue treatment.  #4 chronic anticoagulation:  Continue Alen Blew, MD 07/10/2017, 11:35 AM

## 2017-07-11 DIAGNOSIS — D5700 Hb-SS disease with crisis, unspecified: Secondary | ICD-10-CM | POA: Diagnosis not present

## 2017-07-11 DIAGNOSIS — R52 Pain, unspecified: Secondary | ICD-10-CM | POA: Diagnosis not present

## 2017-07-11 DIAGNOSIS — G894 Chronic pain syndrome: Secondary | ICD-10-CM | POA: Diagnosis not present

## 2017-07-11 LAB — COMPREHENSIVE METABOLIC PANEL
ALBUMIN: 3.6 g/dL (ref 3.5–5.0)
ALT: 52 U/L (ref 14–54)
ANION GAP: 10 (ref 5–15)
AST: 20 U/L (ref 15–41)
Alkaline Phosphatase: 131 U/L — ABNORMAL HIGH (ref 38–126)
BILIRUBIN TOTAL: 1.4 mg/dL — AB (ref 0.3–1.2)
BUN: 9 mg/dL (ref 6–20)
CHLORIDE: 107 mmol/L (ref 101–111)
CO2: 23 mmol/L (ref 22–32)
Calcium: 9 mg/dL (ref 8.9–10.3)
Creatinine, Ser: 0.67 mg/dL (ref 0.44–1.00)
GFR calc Af Amer: >60 mL/min (ref >60)
GFR calc non Af Amer: >60 mL/min (ref >60)
GLUCOSE: 99 mg/dL (ref 65–99)
POTASSIUM: 3.8 mmol/L (ref 3.5–5.1)
Sodium: 140 mmol/L (ref 135–145)
TOTAL PROTEIN: 8 g/dL (ref 6.5–8.1)

## 2017-07-11 LAB — CBC WITH DIFFERENTIAL/PLATELET
BASOS ABS: 0 10*3/uL (ref 0.0–0.1)
Basophils Relative: 0 %
EOS PCT: 3 %
Eosinophils Absolute: 0.3 10*3/uL (ref 0.0–0.7)
HEMATOCRIT: 26.2 % — AB (ref 36.0–46.0)
Hemoglobin: 8.5 g/dL — ABNORMAL LOW (ref 12.0–15.0)
LYMPHS PCT: 22 %
Lymphs Abs: 2.7 10*3/uL (ref 0.7–4.0)
MCH: 26.8 pg (ref 26.0–34.0)
MCHC: 32.4 g/dL (ref 30.0–36.0)
MCV: 82.6 fL (ref 78.0–100.0)
MONO ABS: 0.9 10*3/uL (ref 0.1–1.0)
Monocytes Relative: 8 %
Neutro Abs: 8 10*3/uL — ABNORMAL HIGH (ref 1.7–7.7)
Neutrophils Relative %: 67 %
PLATELETS: 955 10*3/uL — AB (ref 150–400)
RBC: 3.17 MIL/uL — AB (ref 3.87–5.11)
RDW: 19.4 % — ABNORMAL HIGH (ref 11.5–15.5)
WBC: 11.9 10*3/uL — AB (ref 4.0–10.5)

## 2017-07-11 NOTE — Progress Notes (Signed)
PHARMACIST - PHYSICIAN COMMUNICATION  DR: Jonelle Sidle CONCERNING: IV to Oral Route Change Policy  RECOMMENDATION: This patient is receiving diphenhydramine by the intravenous route.  Based on criteria approved by the Pharmacy and Therapeutics Committee, intravenous diphenhydramine is being converted to the equivalent oral dose form(s). Note, patient ordered both PO and IV diphenhydramine, and IV formulation will be discontinued per criteria below.   DESCRIPTION: These criteria include:  Diphenhydramine is not prescribed to treat or prevent a severe allergic reaction  Diphenhydramine is not prescribed as premedication prior to receiving blood product, biologic medication, antimicrobial, or chemotherapy agent  The patient has tolerated at least one dose of an oral or enteral medication  The patient has no evidence of active gastrointestinal bleeding or impaired GI absorption (gastrectomy, short bowel, patient on TNA or NPO).  The patient is not undergoing procedural sedation   If you have questions about this conversion, please contact the Pharmacy Department  []   [phone] )  [facility] []   [phone] )  [facility] []   [phone] )  [facility] []   [phone] )  [facility] [x]   [phone] )  [place], PharmD, [place] Pager: [phone] 07/11/2017 8:38 AM

## 2017-07-11 NOTE — Consult Note (Addendum)
   [facility] CM Inpatient Consult   07/11/2017  [name] [birth year] [id number]     Spoke with patient to discuss [place] Management services. Discussed that [place] had been trying to reach her on prior accounts. [name] confirms states she thinks she remembers some calls but her son has her phone most of the time.   Denies needing having any [facility] Care Management needs at this time.   Asked patient to contact [name] Management program in the future if needed.   Made inpatient RNCM aware.  Addendum at 1448: Went to bedside. Left [facility] Care Management brochure with contact information for [name] to contact if she needs [facility] Care Management program in the future. She is agreeable to this.    Marthenia Rolling, MSN-Ed, RN,BSN [facility] Liaison [phone]

## 2017-07-11 NOTE — Progress Notes (Signed)
Subjective: A 27 year old female admitted with  Sickle cell painful crisis.  Patient on Dilaudid PCA with Toradol and IV fluids.  Pain is right upper quadrant which is persistent.  No evidence of gallbladder disease.  She also has lower back and lower extremity pain which is persistent.  No fever, no chills, no nausea vomiting or diarrhea.   Pain is down to 8 out of 10 today..  Objective: Vital signs in last 24 hours: Temp:  [97.9 F (36.6 C)-98.7 F (37.1 C)] 97.9 F (36.6 C) (02/26 0423) Pulse Rate:  [72-98] 83 (02/26 0423) Resp:  [11-24] 15 (02/26 0432) BP: (92-123)/(50-93) 123/93 (02/26 0423) SpO2:  [96 %-100 %] 97 % (02/26 0432) Weight:  [87.7 kg (193 lb 5.5 oz)] 87.7 kg (193 lb 5.5 oz) (02/25 2121) Weight change: -8.009 kg (-10.5 oz)    Intake/Output from previous day: 02/25 0701 - 02/26 0700 In: 1240 [P.O.:240; I.V.:1000] Out: -  Intake/Output this shift: No intake/output data recorded.  General appearance: alert, cooperative, appears stated age and no distress Head: Normocephalic, without obvious abnormality, atraumatic Neck: no adenopathy, no carotid bruit, no JVD, supple, symmetrical, trachea midline and thyroid not enlarged, symmetric, no tenderness/mass/nodules Back: symmetric, no curvature. ROM normal. No CVA tenderness. Resp: clear to auscultation bilaterally Cardio: regular rate and rhythm, S1, S2 normal, no murmur, click, rub or gallop GI: soft, non-tender; bowel sounds normal; no masses,  no organomegaly Extremities: extremities normal, atraumatic, no cyanosis or edema Pulses: 2+ and symmetric Skin: Skin color, texture, turgor normal. No rashes or lesions Neurologic: Grossly normal  Lab Results: Recent Labs    07/10/17 0600 07/11/17 0406  WBC 14.2* 11.9*  HGB 8.5* 8.5*  HCT 25.5* 26.2*  PLT 842* 955*   BMET Recent Labs    07/10/17 0600 07/11/17 0406  NA 139 140  K 4.0 3.8  CL 104 107  CO2 23 23  GLUCOSE 105* 99  BUN 7 9  CREATININE 0.62 0.67   CALCIUM 9.2 9.0    Studies/Results: Dg Chest 2 View  Result Date: 07/10/2017 CLINICAL DATA:  Chest pain.  History of sickle cell disease. EXAM: CHEST  2 VIEW COMPARISON:  07/05/2017 FINDINGS: Porta catheter in good position with tip at the distal SVC. There is no edema, consolidation, effusion, or pneumothorax. Normal heart size and mediastinal contours. 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 IMPRESSION: No evidence of active disease. Lower lobe opacities on prior are no longer seen. Electronically Signed   By: Monte Fantasia M.D.   On: 07/10/2017 06:52    Medications: I have reviewed the patient's current medications.  Assessment/Plan: A 27 year old female  Admitted with sickle cell painful crisis  #1 sickle cell painful crisis: Continue Dilaudid PCA  With Toradol and IV fluids.  Continue long acting pain medications.  #2 sickle cell anemia: Hemoglobin has remained stable.  Continue to monitor  #3 chronic pain syndrome: Continue MS Contin.  #4 chronic anticoagulation: Continue Xarelto    LOS: 1 day   Daphne Karrer,LAWAL 07/11/2017, 7:29 AM

## 2017-07-12 DIAGNOSIS — D5700 Hb-SS disease with crisis, unspecified: Secondary | ICD-10-CM | POA: Diagnosis not present

## 2017-07-12 MED ORDER — SODIUM CHLORIDE 0.9% FLUSH: 10.0000 mL | Freq: Two times a day (BID) | INTRAVENOUS | Status: DC

## 2017-07-12 MED ORDER — SODIUM CHLORIDE 0.9% FLUSH: 10.0000 mL | INTRAVENOUS | Status: DC | PRN

## 2017-07-12 MED ORDER — OXYCODONE HCL 5 MG PO TABS
20.0000 mg | ORAL_TABLET | ORAL | Status: DC | PRN
  Administered 2017-07-12 – 2017-07-13 (×4): 20 mg via ORAL
  Filled 2017-07-12 (×4): qty 4

## 2017-07-12 NOTE — Clinical Social Work Note (Signed)
Clinical Social Work Assessment  Patient Details  Name: [name] MRN: [id number] Date of Birth: 07/24/1990  Date of referral:  07/12/17               Reason for consult:  Emotional/Coping/Adjustment to Illness, Mental Health Concerns                Permission sought to share information with:    Permission granted to share information::     Name::        Agency::     Relationship::     Contact Information:     Housing/Transportation Living arrangements for the past 2 months:  Apartment Source of Information:  Patient Patient Interpreter Needed:  None Criminal Activity/Legal Involvement Pertinent to Current Situation/Hospitalization:  No - Comment as needed Significant Relationships:  Parents, Dependent Children, Siblings, Community Support Lives with:  Self, Minor Children Do you feel safe going back to the place where you live?  Yes Need for family participation in patient care:  No (Coment)  Care giving concerns:  Pt lives at home in apartment with her 2 children (1 and 2 yrs old). She has secured daycare for them (mother is working at a daycare center and gets "50% off for my kids to come there") and is working with [facility] to secure more long term daycare. This was main concern during last assessment last month. Today, she reports her caregiving concern is that her PCP "lost license and the practice is closing." She is concerned about finding a new PCP and is inquiring with sickle cell clinic for referral. CSW informed about [facility].   Social Worker assessment / plan:  CSW met with pt due to her request of RN to "start seeing a new therapist." Met with pt at bedside. Pt known to CSW from previous admissions. Pt welcoming of CSW involvement again. Pt explains that she "used to go to a therapist for my trichotillomania that's not the issue I'm having right now." pt went on to explain she is "feeling owerwhelmed and  stress just because of all that's going on in my life. Taking care of my kids, working toward getting a part time job and a bigger apartment, my kids' dads being incarcerated, all of this while dealing with my illness and being in and out of the hospital is a lot." CSW validated pt's emotional issues. She states that she "has a lot of support, my mom and sister, the sickle cell clinic, my housing social worker." CSW provided options for therapy providers in area.  Plan: Pt states she will do research to follow up with selecting a therapy provider to address her issues with stress around family, medical, and social stressors.    Employment status:  Disabled (Comment on whether or not currently receiving Disability)(receives disability) Insurance information:  Medicare, Medicaid In State PT Recommendations:  Not assessed at this time Information / Referral to community resources:  Outpatient Psychiatric Care (Comment Required)(OP therapy)  Patient/Family's Response to care:  appreciative  Patient/Family's Understanding of and Emotional Response to Diagnosis, Current Treatment, and Prognosis:  See above- pt understanding and with appropriate emotional response to situation.   Emotional Assessment Appearance:  Appears stated age Attitude/Demeanor/Rapport:  Engaged Affect (typically observed):  Anxious, Pleasant Orientation:  Oriented to Self, Oriented to Place, Oriented to  Time, Oriented to Situation Alcohol / Substance use:  Not Applicable Psych involvement (Current and /or in the community):  No (  Comment)  Discharge Needs  Concerns to be addressed:  Mental Health Concerns Readmission within the last 30 days:  Yes Current discharge risk:  Chronically ill Barriers to Discharge:  Continued Medical Work up    R , LCSW 07/12/2017, 11:33 AM  [phone]  

## 2017-07-12 NOTE — Progress Notes (Signed)
This CM was informed by RN that pt was concerned about getting a new PCP. Pt states that her PCP Dr. Alyson Ingles is not practicing at this time. Pt also states that she has been told she is not able to be seen at the [facility]. Pt given information about potentially seeing a Hematologist in the [place] area. [facility] phone number given to pt to inquire about a new pt appointment with one of the Hematologists. Marney Doctor RN,BSN,NCM [phone]

## 2017-07-12 NOTE — Progress Notes (Signed)
Subjective: Patient is slowly improving but still having pain.  Pain is at 8 out of 10 today.  She is on the Dilaudid PCA and has used properly aggressive the Dilaudid with 15 demands and 15 deliveries in the last 24 hours.  She is still having a right upper quadrant abdominal pain.  No nausea vomiting or diarrhea and no fever.   Objective: Vital signs in last 24 hours: Temp:  [98.2 F (36.8 C)-98.6 F (37 C)] 98.5 F (36.9 C) (02/27 1320) Pulse Rate:  [71-92] 71 (02/27 1320) Resp:  [11-18] 14 (02/27 1320) BP: (102-113)/(54-78) 113/78 (02/27 1320) SpO2:  [98 %-100 %] 100 % (02/27 1320) Weight change:  Last BM Date: 07/09/17  Intake/Output from previous day: 02/26 0701 - 02/27 0700 In: 605.5 [P.O.:600; I.V.:5.5] Out: 240 [Urine:240] Intake/Output this shift: Total I/O In: 240 [P.O.:240] Out: -   General appearance: alert, cooperative, appears stated age and no distress Head: Normocephalic, without obvious abnormality, atraumatic Neck: no adenopathy, no carotid bruit, no JVD, supple, symmetrical, trachea midline and thyroid not enlarged, symmetric, no tenderness/mass/nodules Back: symmetric, no curvature. ROM normal. No CVA tenderness. Resp: clear to auscultation bilaterally Cardio: regular rate and rhythm, S1, S2 normal, no murmur, click, rub or gallop GI: soft, non-tender; bowel sounds normal; no masses,  no organomegaly Extremities: extremities normal, atraumatic, no cyanosis or edema Pulses: 2+ and symmetric Skin: Skin color, texture, turgor normal. No rashes or lesions Neurologic: Grossly normal  Lab Results: Recent Labs    07/10/17 0600 07/11/17 0406  WBC 14.2* 11.9*  HGB 8.5* 8.5*  HCT 25.5* 26.2*  PLT 842* 955*   BMET Recent Labs    07/10/17 0600 07/11/17 0406  NA 139 140  K 4.0 3.8  CL 104 107  CO2 23 23  GLUCOSE 105* 99  BUN 7 9  CREATININE 0.62 0.67  CALCIUM 9.2 9.0    Studies/Results: No results found.  Medications: I have reviewed the  patient's current medications.  Assessment/Plan: A 27 year old female  Admitted with sickle cell painful crisis  #1 sickle cell painful crisis: Continue Dilaudid PCA  With Toradol and IV fluids.  Continue long acting pain medications. Restart home Oxycodone.   #2 sickle cell anemia: Hemoglobin has remained stable.  Continue to monitor  #3 chronic pain syndrome: Continue MS Contin.  #4 chronic anticoagulation: Continue Xarelto    LOS: 2 days   GARBA,LAWAL 07/12/2017, 5:37 PM

## 2017-07-13 DIAGNOSIS — R52 Pain, unspecified: Secondary | ICD-10-CM | POA: Diagnosis not present

## 2017-07-13 DIAGNOSIS — D571 Sickle-cell disease without crisis: Secondary | ICD-10-CM

## 2017-07-13 DIAGNOSIS — Z7901 Long term (current) use of anticoagulants: Secondary | ICD-10-CM | POA: Diagnosis not present

## 2017-07-13 DIAGNOSIS — F1123 Opioid dependence with withdrawal: Secondary | ICD-10-CM | POA: Diagnosis not present

## 2017-07-13 DIAGNOSIS — D638 Anemia in other chronic diseases classified elsewhere: Secondary | ICD-10-CM

## 2017-07-13 DIAGNOSIS — G894 Chronic pain syndrome: Principal | ICD-10-CM

## 2017-07-13 LAB — CBC WITH DIFFERENTIAL/PLATELET
Basophils Absolute: 0.1 10*3/uL (ref 0.0–0.1)
Basophils Relative: 1 %
Eosinophils Absolute: 0.6 10*3/uL (ref 0.0–0.7)
Eosinophils Relative: 6 %
HCT: 21.7 % — ABNORMAL LOW (ref 36.0–46.0)
HEMOGLOBIN: 7.2 g/dL — AB (ref 12.0–15.0)
LYMPHS PCT: 20 %
Lymphs Abs: 2.1 10*3/uL (ref 0.7–4.0)
MCH: 27.5 pg (ref 26.0–34.0)
MCHC: 33.2 g/dL (ref 30.0–36.0)
MCV: 82.8 fL (ref 78.0–100.0)
MONOS PCT: 9 %
Monocytes Absolute: 0.9 10*3/uL (ref 0.1–1.0)
Neutro Abs: 6.6 10*3/uL (ref 1.7–7.7)
Neutrophils Relative %: 64 %
Platelets: 909 10*3/uL (ref 150–400)
RBC: 2.62 MIL/uL — AB (ref 3.87–5.11)
RDW: 19.9 % — ABNORMAL HIGH (ref 11.5–15.5)
WBC: 10.3 10*3/uL (ref 4.0–10.5)

## 2017-07-13 LAB — RETICULOCYTES
RBC.: 2.62 MIL/uL — AB (ref 3.87–5.11)
RETIC CT PCT: 5.4 % — AB (ref 0.4–3.1)
Retic Count, Absolute: 141.5 10*3/uL (ref 19.0–186.0)

## 2017-07-13 MED ORDER — HEPARIN SOD (PORK) LOCK FLUSH 100 UNIT/ML IV SOLN
500.0000 [IU] | Freq: Once | INTRAVENOUS | Status: AC
  Administered 2017-07-13: 500 [IU] via INTRAVENOUS
  Filled 2017-07-13: qty 5

## 2017-07-13 NOTE — Progress Notes (Signed)
Per Andria Frames at the [facility], pt can come back and be seen as a pt here due to being without a primary care physician at this time. Pt made aware and plans to make an appointment. Marney Doctor RN,BSN,NCM [phone]

## 2017-07-13 NOTE — Care Management Important Message (Signed)
Important Message  Patient Details  Name: [name] MRN: [id number] Date of Birth: January 15, 1991   Medicare Important Message Given:  Yes    Kerin Salen 07/13/2017, 11:48 AM

## 2017-07-13 NOTE — Discharge Summary (Signed)
[name] MRN: [id number] DOB/AGE: 12-29-1990 26 y.o.  Admit date: 07/10/2017 Discharge date: 07/13/2017  Primary Care Physician:  Ricke Hey, MD   Discharge Diagnoses:   Patient Active Problem List   Diagnosis Date Noted  . Major depression, chronic 01/06/2011    Priority: High  . Chronic anticoagulation   . Sickle cell anemia ([place]) 05/19/2016  . History of DVT (deep vein thrombosis) 04/17/2016  . Thrombocytosis ([place]) 11/09/2015  . Hyperbilirubinemia 11/09/2015  . Chronic pain 08/04/2015  . Herpes simplex 07/14/2015  . Anemia of chronic disease   . 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     Latex Rash      Medication List    TAKE these medications   aspirin-acetaminophen-caffeine 250-250-65 MG tablet Commonly known as:  EXCEDRIN MIGRAINE Take 2 tablets every 6 (six) hours as needed by mouth for headache.   hydroxyurea 500 MG capsule Commonly known as:  HYDREA Take 2 tabs (1000 mg ) PO QOD starting on 06/02/2017 alternating with 1 tab (500 mg) PO QOD starting on 06/03/2017. May take with food to minimize GI side effects.   medroxyPROGESTERone 150 MG/ML injection Commonly known as:  DEPO-PROVERA Inject 1 mL (150 mg total) into the muscle every 3 (three) months.   morphine 30 MG 12 hr tablet Commonly known as:  MS CONTIN Take 30 mg by mouth every 12 (twelve) hours.   Oxycodone HCl 20 MG Tabs Take 20 mg every 4 (four) hours as needed by mouth (pain).   rivaroxaban 20 MG Tabs tablet Commonly known as:  XARELTO Take 1 tablet (20 mg total) by mouth daily with supper.         Consults:    SIGNIFICANT DIAGNOSTIC STUDIES:  Dg Chest 2 View  Result Date: 07/10/2017 CLINICAL DATA:  Chest pain.  History of sickle cell disease. EXAM: CHEST  2 VIEW COMPARISON:  07/05/2017 FINDINGS:  Porta catheter in good position with tip at the distal SVC. There is no edema, consolidation, effusion, or pneumothorax. Normal heart size and mediastinal contours. 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 IMPRESSION: No evidence of active disease. Lower lobe opacities on prior are no longer seen. Electronically Signed   By: Monte Fantasia M.D.   On: 07/10/2017 06:52   Dg Chest 2 View  Result Date: 07/05/2017 CLINICAL DATA:  Fever.  Chest pain under both breasts. EXAM: CHEST  2 VIEW COMPARISON:  05/29/2017 FINDINGS: Borderline heart size. Stable mediastinal contours. Porta catheter on the right with tip at the SVC level. There is streaky density at the bases with trace pleural effusions. Mild fissural thickening and airway cuffing. IMPRESSION: 1. Mild lung opacity at the bases with CT appearance yesterday favoring atelectasis over infection. Need clinical correlation for acute chest syndrome. 2. Trace pleural effusions and airway cuffing, is there volume overload in this patient with mild cardiomegaly? Electronically Signed   By: Monte Fantasia M.D.   On: 07/05/2017 17:01   Ct Abdomen W Contrast  Result Date: 07/04/2017 CLINICAL DATA:  Right upper quadrant pain history of sickle cell EXAM: CT ABDOMEN WITH CONTRAST TECHNIQUE: Multidetector CT imaging of the abdomen was performed using the standard protocol following bolus administration of intravenous contrast. CONTRAST:  121mL ISOVUE-300 IOPAMIDOL (ISOVUE-300) INJECTION 61% COMPARISON:  CT 02/27/2017, 02/26/2016, 06/22/2012 FINDINGS: Lower chest: Lung  bases demonstrate small pleural effusions. Patchy dependent consolidations and ground-glass densities. Mild cardiomegaly. Hepatobiliary: Liver is enlarged, with craniocaudad measurement of 22 cm. No focal hepatic abnormality is seen. Surgical clips at the gallbladder fossa. No biliary dilatation. Pancreas: Unremarkable. No pancreatic ductal dilatation or surrounding inflammatory  changes. Spleen: Heterogeneous with multiple hypodense nodules, stable compared with multiple prior studies Adrenals/Urinary Tract: Adrenal glands are within normal limits. Kidneys are unremarkable Stomach/Bowel: Stomach within normal limits. Visible colon and small bowel are within normal limits. Vascular/Lymphatic: Nonaneurysmal upper abdominal aorta. No significantly enlarged lymph nodes. Other: Negative for free air or ascites in the upper abdomen Musculoskeletal: Bony changes at the spine consistent with history of sickle cell disease. IMPRESSION: 1. Hepatomegaly without focal hepatic abnormality or biliary dilatation 2. Status post cholecystectomy 3. Small pleural effusions. Patchy dependent consolidations and ground-glass densities, favor atelectasis, less likely infection. 4. Stable diffuse nodularity of the spleen Electronically Signed   By: Donavan Foil M.D.   On: 07/04/2017 18:53      Recent Results (from the past 240 hour(s))  Culture, blood (Routine X 2) w Reflex to ID Panel     Status: None   Collection Time: 07/04/17  1:53 PM  Result Value Ref Range Status   Specimen Description   Final    BLOOD LEFT ANTECUBITAL Performed at [facility] [address], [place], [place] [address]    Special Requests   Final    IN PEDIATRIC BOTTLE Blood Culture adequate volume Performed at [facility] [address] [place], [place] [address]    Culture   Final    NO GROWTH 5 DAYS Performed at [facility], [place] [address] [place], [place] [address]    Report Status 07/09/2017 FINAL  Final  Culture, blood (Routine X 2) w Reflex to ID Panel     Status: None   Collection Time: 07/04/17  2:03 PM  Result Value Ref Range Status   Specimen Description   Final    BLOOD LEFT HAND Performed at [facility] [address], [place], [place] [address]    Special Requests   Final    BOTTLES DRAWN AEROBIC ONLY Blood Culture adequate  volume Performed at [facility] [address], [place], [place] [address]    Culture   Final    NO GROWTH 5 DAYS Performed at [facility], [place] [address], [place], [place] [address]    Report Status 07/09/2017 FINAL  Final    BRIEF ADMITTING H & P:  patient is 27 year old female with known sickle cell disease here with  Pain in her back and legs as well as right upper quadrant pain. Pain is rated as 9 out of 10..  Patient has used home medication  Without relief.   The pain has persisted  In the ER with up to 6 mg of Dilaudid given but no relief.  She has some nausea but no vomiting  Initial evaluation included CT abdomen and pelvis showing no acute findings.  Symptoms are consistent with known sickle cell crisis.     Hospital Course:  Present on Admission: . Anemia of chronic disease . Chronic pain . Hb-SS disease without crisis ([place])  Opiate tolerant patient with Hb SS was re-admitted with pain after being discharged only 2 days before. She was treated as sickle cell crisis with IV Dilaudid, Toradol and IVF. However it is my evaluation that Ms. Vanrossum is not currently in  crisis. Rather she has pain as suffering that is associated with her brains response to opiate exposure in the setting of Marijuana and Cocaine. Additionally she was recently transfused with 2 units of blood on her last admission which would decrease her percentage of Hb S. Currently she is able to ambulate without difficulty. She may in fact require a taper of her medications so as not to rebound into withdrawal and I will place her on a schedule of her MS Contin to taper as well continuing her Oxycodone on as PRN basis. I have also ordered an electrophoresis to document her post transfusion Hb S.   Pt has a brain problem with opiates as evidenced by withdrawal and tolerance demonstrated during her admissions. Additionally she has had polysubstance abuse but is in denial regarding her issues  with opiates. She has in the past been offered rehab but has refused. I have again offered rehab but she is in a pre-contemplative state.   Of note I have checked the PMP Aware Database and note that she should have enough medication to last her for 8 more days. However she is at risk as her Physician who prescribes her medication has been rendered suddenly out of Practice and she has no current plan for follow up.The patient care center has agreed to see the patient for follow-up care in the [facility] setting on 07/24/2017 at 1:30 PM.   Disposition and Follow-up: Pt is discharged home in good condition.  She has an appointment to follow-up with the patient care center on 07/24/2017 at 1:30 PM.   DISCHARGE EXAM:  General: Alert, awake, oriented x3, in no apparent distress.  HEENT: [place]/AT PEERL, EOMI, anicteric Neck: Trachea midline, no masses, no thyromegal,y no JVD, no carotid bruit OROPHARYNX: Moist, No exudate/ erythema/lesions.  Heart: Regular rate and rhythm, without murmurs, rubs, gallops or S3. PMI non-displaced. Exam reveals no decreased pulses. Pulmonary/Chest: Normal effort. Breath sounds normal. No. Apnea. Clear to auscultation,no stridor,  no wheezing and no rhonchi noted. No respiratory distress and no tenderness noted. Abdomen: Obese, soft, nontender, nondistended, normal bowel sounds, no masses noted. Liver enlarged and tender.  No fluid wave and no ascites. There is no guarding or rebound. Neuro: Alert and oriented to person, place and time. Normal motor skills, Displays no atrophy or tremors and exhibits normal muscle tone.  No focal neurological deficits noted cranial nerves II through XII grossly intact. No sensory deficit noted. Strength at baseline in bilateral upper and lower extremities. Gait normal. Musculoskeletal: No warmth swelling or erythema around joints, no spinal tenderness noted. Psychiatric: Patient alert and oriented x3, good insight and cognition, good recent to  remote recall. Skin: Skin is warm and dry. No bruising, no ecchymosis and no rash noted. Pt is not diaphoretic. No erythema. No pallor    Blood pressure (!) 93/59, pulse 87, temperature 98.8 F (37.1 C), temperature source Oral, resp. rate 12, height 5\' 1"  (1.549 m), weight 92.7 kg (204 lb 5.9 oz), last menstrual period 06/29/2017, SpO2 99 %, not currently breastfeeding.  Recent Labs    07/11/17 0406  NA 140  K 3.8  CL 107  CO2 23  GLUCOSE 99  BUN 9  CREATININE 0.67  CALCIUM 9.0   Recent Labs    07/11/17 0406  AST 20  ALT 52  ALKPHOS 131*  BILITOT 1.4*  PROT 8.0  ALBUMIN 3.6   No results for input(s): LIPASE, AMYLASE in the last 72 hours. Recent Labs    07/11/17 0406  07/13/17 1100  WBC 11.9* 10.3  NEUTROABS 8.0* PENDING  HGB 8.5* 7.2*  HCT 26.2* 21.7*  MCV 82.6 82.8  PLT 955* 909*     Total time spent including face to face and decision making was greater than 30 minutes  Signed: Jovanka Westgate A. 07/13/2017, 1:42 PM

## 2017-07-14 LAB — HEMOGLOBINOPATHY EVALUATION
HGB C: 0 %
HGB S QUANTITAION: 61.2 % — AB
Hgb A2 Quant: 4.2 % — ABNORMAL HIGH (ref 1.8–3.2)
Hgb A: 25.8 % — ABNORMAL LOW (ref 96.4–98.8)
Hgb F Quant: 8.8 % — ABNORMAL HIGH (ref 0.0–2.0)
Hgb Variant: 0 %

## 2017-07-24 ENCOUNTER — Encounter: Payer: Self-pay | Admitting: Family Medicine

## 2017-07-24 ENCOUNTER — Ambulatory Visit (INDEPENDENT_AMBULATORY_CARE_PROVIDER_SITE_OTHER): Payer: Medicare Other | Admitting: Family Medicine

## 2017-07-24 VITALS — BP 110/60 | HR 96 | Temp 98.8°F | Ht 61.0 in | Wt 197.0 lb

## 2017-07-24 DIAGNOSIS — D571 Sickle-cell disease without crisis: Secondary | ICD-10-CM | POA: Diagnosis not present

## 2017-07-24 DIAGNOSIS — F1190 Opioid use, unspecified, uncomplicated: Secondary | ICD-10-CM | POA: Diagnosis not present

## 2017-07-24 DIAGNOSIS — G8929 Other chronic pain: Secondary | ICD-10-CM | POA: Diagnosis not present

## 2017-07-24 DIAGNOSIS — F1910 Other psychoactive substance abuse, uncomplicated: Secondary | ICD-10-CM

## 2017-07-24 LAB — POCT URINALYSIS DIP (DEVICE)
BILIRUBIN URINE: NEGATIVE
Glucose, UA: NEGATIVE mg/dL
HGB URINE DIPSTICK: NEGATIVE
KETONES UR: NEGATIVE mg/dL
Nitrite: NEGATIVE
PH: 5.5 (ref 5.0–8.0)
Protein, ur: NEGATIVE mg/dL
Specific Gravity, Urine: <=1.005 (ref 1.005–1.030)
Urobilinogen, UA: 0.2 mg/dL (ref 0.0–1.0)

## 2017-07-24 MED ORDER — HYDROXYUREA 500 MG PO CAPS: ORAL_CAPSULE | ORAL | 3 refills | Status: DC

## 2017-07-24 MED ORDER — RIVAROXABAN 20 MG PO TABS: 20.0000 mg | ORAL_TABLET | Freq: Every day | ORAL | 3 refills | Status: DC

## 2017-07-24 MED ORDER — CYCLOBENZAPRINE HCL 10 MG PO TABS: 10.0000 mg | ORAL_TABLET | Freq: Three times a day (TID) | ORAL | 0 refills | Status: DC | PRN

## 2017-07-24 MED ORDER — GABAPENTIN 300 MG PO CAPS: 300.0000 mg | ORAL_CAPSULE | Freq: Three times a day (TID) | ORAL | 3 refills | Status: DC

## 2017-07-24 NOTE — Progress Notes (Signed)
Patient ID: [name], female    DOB: 19-Mar-1991, 27 y.o.   MRN: [id number]  PCP: Scot Jun, FNP  Chief Complaint  Patient presents with  . Hospitalization Follow-up    Subjective:  HPI [name] is a 27 y.o. female with sickle cell anemia, substance abuse, chronic opioid dependency, chronic anticoagulation present for a sickle cell follow-up. [name] was last seen in office 04/27/2017. During that visit, she reported recently completing substance abuse rehabilitation and was focused on improving her overall quality of life. Since that visit, [name] has experienced several crisis rthat has increased her visits to the ED and resulted in admissions. Her major concern today is lack of pain control. She is out of previously prescribed pain medication and due to chronic cocaine abuse she is no eligible to receive pain medications from practice. She was referred to pain management in December, however upon contacting the office they reports never accepting patient's referral. Current pain 6/10 mostly to her back and this is the intensity that she averages most of the time with pain medication. During a recent hospital admission, urine drug screen was positive for cocaine and opioids (07/02/2017) which she is not denying use. She is negative of chest pain, shortness of breath, dizziness, and weakness. Social History   Socioeconomic History  . Marital status: Single    Spouse name: Not on file  . Number of children: Not on file  . Years of education: Not on file  . Highest education level: Not on file  Social Needs  . Financial resource strain: Not on file  . Food insecurity - worry: Not on file  . Food insecurity - inability: Not on file  . Transportation needs - medical: Not on file  . Transportation needs - non-medical: Not on file  Occupational History  . Not on file  Tobacco Use  . Smoking status: Former Smoker    Packs/day: 0.25    Years: 1.00    Pack years:  0.25    Types: Cigarettes    Last attempt to quit: 03/25/2013    Years since quitting: 4.3  . Smokeless tobacco: Never Used  Substance and Sexual Activity  . Alcohol use: No    Comment: denies  . Drug use: No    Comment: "Clean for 3 months"  . Sexual activity: Yes    Partners: Male    Birth control/protection: None    Comment: last sex Jul 11 2015  Other Topics Concern  . Not on file  Social History Narrative   Lives  With mother   FOB is supportive-supposed to be moving next month   Is a student at [employer] time    Family History  Problem Relation Age of Onset  . Sickle cell trait Mother   . Sickle cell trait Father   . Diabetes Maternal Grandmother   . Diabetes Paternal Grandmother   . Hypertension Paternal Grandmother   . Diabetes Maternal Grandfather    Review of Systems Pertinent negatives listed in HPI  Patient Active Problem List   Diagnosis Date Noted  . Chronic anticoagulation   . Sickle cell anemia ([place]) 05/19/2016  . History of DVT (deep vein thrombosis) 04/17/2016  . Thrombocytosis ([place]) 11/09/2015  . Hyperbilirubinemia 11/09/2015  . Chronic pain 08/04/2015  . Herpes simplex 07/14/2015  . Anemia of chronic disease   . 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 Hb-SS disease without crisis ([place]) 06-24-1991    Allergies  Allergen  Reactions  . Carrot [Daucus [place]ota] Hives and Swelling    Allergic to carrots  . Latex Rash    Prior to Admission medications   Medication Sig Start Date End Date Taking? Authorizing Provider  aspirin-acetaminophen-caffeine (EXCEDRIN MIGRAINE) [phone] MG tablet Take 2 tablets every 6 (six) hours as needed by mouth for headache.   Yes [provider]  hydroxyurea (HYDREA) 500 MG capsule Take 2 tabs (1000 mg ) PO QOD starting on 06/02/2017 alternating with 1 tab (500 mg) PO QOD starting on 06/03/2017. May take with food to minimize GI side effects. 06/01/17  Yes [name], MD   medroxyPROGESTERone (DEPO-PROVERA) 150 MG/ML injection Inject 1 mL (150 mg total) into the muscle every 3 (three) months. 09/21/16  Yes Shelly Bombard, MD  morphine (MS CONTIN) 30 MG 12 hr tablet Take 30 mg by mouth every 12 (twelve) hours.   Yes [provider]  Oxycodone HCl 20 MG TABS Take 20 mg every 4 (four) hours as needed by mouth (pain).  01/17/17  Yes [provider]  rivaroxaban (XARELTO) 20 MG TABS tablet Take 1 tablet (20 mg total) by mouth daily with supper. 04/27/17  Yes Scot Jun, FNP    Past Medical, Surgical Family and Social History reviewed and updated.    Objective:   Today's Vitals   07/24/17 1334  BP: 110/60  Pulse: 96  Temp: 98.8 F (37.1 C)  TempSrc: Oral  SpO2: 98%  Weight: 197 lb (89.4 kg)  Height: 5\' 1"  (1.549 m)    Wt Readings from Last 3 Encounters:  07/24/17 197 lb (89.4 kg)  07/13/17 204 lb 5.9 oz (92.7 kg)  07/07/17 211 lb 10.3 oz (96 kg)   Physical Exam Constitutional: Patient appears well-developed and well-nourished. No distress. HENT: Normocephalic, atraumatic, External right and left ear normal. Oropharynx is clear and moist.  Eyes: Conjunctivae and EOM are normal. PERRLA, no scleral icterus. Neck: Normal ROM. Neck supple. No JVD. No tracheal deviation. No thyromegaly. CVS: RRR, S1/S2 +, no murmurs, no gallops, no carotid bruit.  Pulmonary: Effort and breath sounds normal, no stridor, rhonchi, wheezes, rales.  Abdominal: Soft. BS +, no distension, tenderness, rebound or guarding.  Musculoskeletal: Normal range of motion. No edema and no tenderness.  Lymphadenopathy: No lymphadenopathy noted, cervical. Neuro: Alert. Normal reflexes, muscle tone coordination. No cranial nerve deficit. Skin: Skin is warm and dry. No rash noted. Not diaphoretic. No erythema. No pallor. Psychiatric: Normal mood and affect. Behavior, judgment, thought content normal.  Assessment & Plan:  1. Hb-SS disease without crisis  ([name]) Continue Hydrea. We discussed the need for good hydration, monitoring of hydration status, avoidance of heat, cold, stress, and infection triggers. We discussed the risks and benefits of Hydrea, including bone marrow suppression, the possibility of GI upset, skin ulcers, hair thinning, and teratogenicity. The patient was reminded of the need to seek medical attention for any symptoms of bleeding, anemia, or infection. Continue folic acid 1 mg daily to prevent aplastic bone marrow crises.   Pulmonary evaluation - Patient denies severe recurrent wheezes, shortness of breath with exercise, or persistent cough. If these symptoms develop, pulmonary function tests with spirometry will be ordered, and if abnormal, plan on referral to Pulmonology for further evaluation.  Eye - High risk of proliferative retinopathy. Annual eye exam with retinal exam recommended to patient, the patient has had eye exam this year.  Immunization status - Yearly influenza vaccination is recommended, as well as being up to date with  Meningococcal and Pneumococcal vaccines.    2. Other chronic pain 3. Chronic, continuous use of opioids - Ambulatory referral to Pain Clinic, will try to place patient with [facility], as well as exhaust all other pain management resources in efforts to have patient placed with clinic to receive adequate pain control.  Acute and chronic painful episodes - We agreed on Opiate dose and amount of pills  per month. We discussed that pt is to receive Schedule II prescriptions only from our clinic. Pt is also aware that the prescription history is available to [place] online through the [facility] CSRS. Controlled substance agreement reviewed and signed. We reminded [name] that all patients receiving Schedule II narcotics must be seen for follow within one month of prescription being requested. We reviewed the terms of our pain agreement, including the need to keep medicines in a safe locked  location away from children or pets, and the need to report excess sedation or constipation, measures to avoid constipation, and policies related to early refills and stolen prescriptions. According to the [place] [facility] program, we have reviewed details related to analgesia, adverse effects and aberrant behaviors.   4. Substance abuse, continue to educate on the adverse effects of cocaine to overall cardiac function and the fact this abuse is likely increasing frequency of sickle cell crisis. Patient when ready is to follow-up with me or sickle cell agency for resources if substance abuse counseling and rehab are desired.   The patient was given clear instructions to go to ER or return to medical center if symptoms don't improve, worsen or new problems develop. The patient verbalized understanding. The patient was told to call to get lab results if they haven't heard anything in the next week.      Orders Placed This Encounter  Procedures  . CBC with Differential/Platelet  . Comprehensive metabolic panel  . Reticulocytes  . Ambulatory referral to Pain Clinic  . POCT urinalysis dip (device)    Meds ordered this encounter  Medications  . gabapentin (NEURONTIN) 300 MG capsule    Sig: Take 1 capsule (300 mg total) by mouth 3 (three) times daily.    Dispense:  90 capsule    Refill:  3    Order Specific Question:   Supervising Provider    Answer:   Tresa Garter [id number]  . cyclobenzaprine (FLEXERIL) 10 MG tablet    Sig: Take 1 tablet (10 mg total) by mouth 3 (three) times daily as needed for muscle spasms.    Dispense:  30 tablet    Refill:  0    Order Specific Question:   Supervising Provider    Answer:   Tresa Garter [id number]  . DISCONTD: hydroxyurea (HYDREA) 500 MG capsule    Sig: Take 2 tabs (1000 mg ) PO QOD starting on 06/02/2017 alternating with 1 tab (500 mg) PO QOD starting on 06/03/2017. May take with food to minimize GI side effects.    Dispense:   60 capsule    Refill:  3    Order Specific Question:   Supervising Provider    Answer:   Tresa Garter [id number]  . rivaroxaban (XARELTO) 20 MG TABS tablet    Sig: Take 1 tablet (20 mg total) by mouth daily with supper.    Dispense:  30 tablet    Refill:  3    Order Specific Question:   Supervising Provider    Answer:   Tresa Garter [id number]  .  hydroxyurea (HYDREA) 500 MG capsule    Sig: Take 2 tabs (1000 mg ) PO QOD starting on 06/02/2017 alternating with 1 tab (500 mg) PO QOD starting on 06/03/2017. May take with food to minimize GI side effects.    Dispense:  60 capsule    Refill:  3    Order Specific Question:   Supervising Provider    Answer:   Tresa Garter [id number]    RTC: 2 months for sickle cell management.   Carroll Sage. Kenton Kingfisher, MSN, FNP-C [facility]  [address] [name] [employer], [place] [address] [phone]

## 2017-07-24 NOTE — Patient Instructions (Addendum)
I am working to have you placed with pain management.  I have refilled medications as requested and prescribed you Gabapentin and Cyclobenzaprine for back pain.   You can contact [facility] to rescheduled appointment  [address] [place], [place] [address] Phone: [phone]     Chronic Back Pain When back pain lasts longer than 3 months, it is called chronic back pain.The cause of your back pain may not be known. Some common causes include:  Wear and tear (degenerative disease) of the bones, ligaments, or disks in your back.  Inflammation and stiffness in your back (arthritis).  People who have chronic back pain often go through certain periods in which the pain is more intense (flare-ups). Many people can learn to manage the pain with home care. Follow these instructions at home: Pay attention to any changes in your symptoms. Take these actions to help with your pain: Activity  Avoid bending and activities that make the problem worse.  Do not sit or stand in one place for long periods of time.  Take brief periods of rest throughout the day. This will reduce your pain. Resting in a lying or standing position is usually better than sitting to rest.  When you are resting for longer periods, mix in some mild activity or stretching between periods of rest. This will help to prevent stiffness and pain.  Get regular exercise. Ask your health care provider what activities are safe for you.  Do not lift anything that is heavier than 10 lb (4.5 kg). Always use proper lifting technique, which includes: ? Bending your knees. ? Keeping the load close to your body. ? Avoiding twisting. Managing pain  If directed, apply ice to the painful area. Your health care provider may recommend applying ice during the first 24-48 hours after a flare-up begins. ? Put ice in a plastic bag. ? Place a towel between your skin and the bag. ? Leave the ice on for 20 minutes, 2-3 times per  day.  After icing, apply heat to the affected area as often as told by your health care provider. Use the heat source that your health care provider recommends, such as a moist heat pack or a heating pad. ? Place a towel between your skin and the heat source. ? Leave the heat on for 20-30 minutes. ? Remove the heat if your skin turns bright red. This is especially important if you are unable to feel pain, heat, or cold. You may have a greater risk of getting burned.  Try soaking in a warm tub.  Take over-the-counter and prescription medicines only as told by your health care provider.  Keep all follow-up visits as told by your health care provider. This is important. Contact a health care provider if:  You have pain that is not relieved with rest or medicine. Get help right away if:  You have weakness or numbness in one or both of your legs or feet.  You have trouble controlling your bladder or your bowels.  You have nausea or vomiting.  You have pain in your abdomen.  You have shortness of breath or you faint. This information is not intended to replace advice given to you by your health care provider. Make sure you discuss any questions you have with your health care provider. Document Released: 06/09/2004 Document Revised: 09/10/2015 Document Reviewed: 10/20/2014 Elsevier Interactive Patient Education  2018 [employer].

## 2017-07-25 ENCOUNTER — Encounter: Payer: Self-pay | Admitting: Family Medicine

## 2017-07-25 ENCOUNTER — Telehealth: Payer: Self-pay

## 2017-07-25 LAB — COMPREHENSIVE METABOLIC PANEL
A/G RATIO: 1.6 (ref 1.2–2.2)
ALT: 7 IU/L (ref 0–32)
AST: 13 IU/L (ref 0–40)
Albumin: 4.5 g/dL (ref 3.5–5.5)
Alkaline Phosphatase: 151 IU/L — ABNORMAL HIGH (ref 39–117)
BUN / CREAT RATIO: 4 — AB (ref 9–23)
BUN: 3 mg/dL — ABNORMAL LOW (ref 6–20)
Bilirubin Total: 1.7 mg/dL — ABNORMAL HIGH (ref 0.0–1.2)
CALCIUM: 9.6 mg/dL (ref 8.7–10.2)
CO2: 22 mmol/L (ref 20–29)
CREATININE: 0.85 mg/dL (ref 0.57–1.00)
Chloride: 106 mmol/L (ref 96–106)
GFR calc Af Amer: 110 mL/min/{1.73_m2} (ref >59)
GFR, EST NON AFRICAN AMERICAN: 96 mL/min/{1.73_m2} (ref >59)
GLOBULIN, TOTAL: 2.9 g/dL (ref 1.5–4.5)
Glucose: 93 mg/dL (ref 65–99)
Potassium: 4.5 mmol/L (ref 3.5–5.2)
SODIUM: 143 mmol/L (ref 134–144)
Total Protein: 7.4 g/dL (ref 6.0–8.5)

## 2017-07-25 LAB — CBC WITH DIFFERENTIAL/PLATELET
BASOS: 1 %
Basophils Absolute: 0.1 10*3/uL (ref 0.0–0.2)
EOS (ABSOLUTE): 0.4 10*3/uL (ref 0.0–0.4)
EOS: 5 %
HEMATOCRIT: 28.9 % — AB (ref 34.0–46.6)
HEMOGLOBIN: 9.3 g/dL — AB (ref 11.1–15.9)
Immature Grans (Abs): 0 10*3/uL (ref 0.0–0.1)
Immature Granulocytes: 0 %
LYMPHS ABS: 1.5 10*3/uL (ref 0.7–3.1)
Lymphs: 18 %
MCH: 26.6 pg (ref 26.6–33.0)
MCHC: 32.2 g/dL (ref 31.5–35.7)
MCV: 83 fL (ref 79–97)
MONOCYTES: 10 %
MONOS ABS: 0.8 10*3/uL (ref 0.1–0.9)
NEUTROS ABS: 5.4 10*3/uL (ref 1.4–7.0)
Neutrophils: 66 %
Platelets: 666 10*3/uL — ABNORMAL HIGH (ref 150–379)
RBC: 3.5 x10E6/uL — ABNORMAL LOW (ref 3.77–5.28)
RDW: 19.8 % — AB (ref 12.3–15.4)
WBC: 8.1 10*3/uL (ref 3.4–10.8)

## 2017-07-25 LAB — RETICULOCYTES: Retic Ct Pct: 10.1 % — ABNORMAL HIGH (ref 0.6–2.6)

## 2017-07-26 NOTE — Telephone Encounter (Signed)
Patient notified that referral will be sent today

## 2017-07-27 DIAGNOSIS — Z87891 Personal history of nicotine dependence: Secondary | ICD-10-CM | POA: Diagnosis not present

## 2017-07-27 DIAGNOSIS — G894 Chronic pain syndrome: Secondary | ICD-10-CM | POA: Diagnosis not present

## 2017-07-27 DIAGNOSIS — E669 Obesity, unspecified: Secondary | ICD-10-CM | POA: Diagnosis not present

## 2017-07-27 DIAGNOSIS — D5700 Hb-SS disease with crisis, unspecified: Secondary | ICD-10-CM | POA: Diagnosis not present

## 2017-07-27 DIAGNOSIS — I82622 Acute embolism and thrombosis of deep veins of left upper extremity: Secondary | ICD-10-CM | POA: Diagnosis not present

## 2017-07-29 ENCOUNTER — Ambulatory Visit (HOSPITAL_COMMUNITY)
Admission: EM | Admit: 2017-07-29 | Discharge: 2017-07-29 | Disposition: A | Discharge status: Home / Self Care | Payer: Medicare Other | Attending: Family Medicine | Admitting: Family Medicine

## 2017-07-29 ENCOUNTER — Other Ambulatory Visit: Payer: Self-pay

## 2017-07-29 ENCOUNTER — Encounter (HOSPITAL_COMMUNITY): Payer: Self-pay | Admitting: Emergency Medicine

## 2017-07-29 DIAGNOSIS — M79601 Pain in right arm: Secondary | ICD-10-CM

## 2017-07-29 DIAGNOSIS — D5700 Hb-SS disease with crisis, unspecified: Secondary | ICD-10-CM

## 2017-07-29 MED ORDER — OXYCODONE HCL 20 MG PO TABS: 20.0000 mg | ORAL_TABLET | ORAL | 0 refills | Status: DC | PRN

## 2017-07-29 MED ORDER — MORPHINE SULFATE ER 30 MG PO TBCR: 30.0000 mg | EXTENDED_RELEASE_TABLET | Freq: Two times a day (BID) | ORAL | 0 refills | Status: DC

## 2017-07-29 NOTE — ED Notes (Signed)
Called from waiting area without any response.

## 2017-07-29 NOTE — ED Triage Notes (Signed)
Patient has sickle cell disease.  Patient has arm pain.  Patient is currently having a flair up of pain in right ar.   No pcp-dr Alyson Ingles was her last pcp.

## 2017-07-29 NOTE — Discharge Instructions (Signed)
If you are not seeing improvement over the next 24-48 hours please proceed to the Emergency Department for further treatment.

## 2017-07-31 ENCOUNTER — Telehealth: Payer: Self-pay

## 2017-08-02 ENCOUNTER — Telehealth: Payer: Self-pay | Admitting: Family Medicine

## 2017-08-02 ENCOUNTER — Other Ambulatory Visit: Payer: Self-pay | Admitting: Family Medicine

## 2017-08-02 ENCOUNTER — Non-Acute Institutional Stay (HOSPITAL_COMMUNITY)
Admission: AD | Admit: 2017-08-02 | Discharge: 2017-08-02 | Disposition: A | Discharge status: Home / Self Care | Payer: Medicare Other | Source: Ambulatory Visit | Attending: Internal Medicine | Admitting: Internal Medicine

## 2017-08-02 ENCOUNTER — Telehealth (HOSPITAL_COMMUNITY): Payer: Self-pay | Admitting: *Deleted

## 2017-08-02 DIAGNOSIS — Z7901 Long term (current) use of anticoagulants: Secondary | ICD-10-CM | POA: Insufficient documentation

## 2017-08-02 DIAGNOSIS — D5700 Hb-SS disease with crisis, unspecified: Secondary | ICD-10-CM | POA: Diagnosis present

## 2017-08-02 DIAGNOSIS — Z79891 Long term (current) use of opiate analgesic: Secondary | ICD-10-CM | POA: Insufficient documentation

## 2017-08-02 LAB — RAPID URINE DRUG SCREEN, HOSP PERFORMED
Amphetamines: NOT DETECTED
BARBITURATES: NOT DETECTED
Benzodiazepines: NOT DETECTED
Cocaine: NOT DETECTED
Opiates: POSITIVE — AB
TETRAHYDROCANNABINOL: NOT DETECTED

## 2017-08-02 LAB — COMPREHENSIVE METABOLIC PANEL
ALK PHOS: 118 U/L (ref 38–126)
ALT: 11 U/L — ABNORMAL LOW (ref 14–54)
ANION GAP: 11 (ref 5–15)
AST: 18 U/L (ref 15–41)
Albumin: 4.2 g/dL (ref 3.5–5.0)
BILIRUBIN TOTAL: 1.7 mg/dL — AB (ref 0.3–1.2)
BUN: 10 mg/dL (ref 6–20)
CALCIUM: 9 mg/dL (ref 8.9–10.3)
CO2: 23 mmol/L (ref 22–32)
Chloride: 104 mmol/L (ref 101–111)
Creatinine, Ser: 0.64 mg/dL (ref 0.44–1.00)
Glucose, Bld: 130 mg/dL — ABNORMAL HIGH (ref 65–99)
POTASSIUM: 3.7 mmol/L (ref 3.5–5.1)
Sodium: 138 mmol/L (ref 135–145)
TOTAL PROTEIN: 7.5 g/dL (ref 6.5–8.1)

## 2017-08-02 LAB — CBC WITH DIFFERENTIAL/PLATELET
Basophils Absolute: 0 10*3/uL (ref 0.0–0.1)
Basophils Relative: 0 %
EOS ABS: 0.2 10*3/uL (ref 0.0–0.7)
Eosinophils Relative: 1 %
HCT: 25.9 % — ABNORMAL LOW (ref 36.0–46.0)
Hemoglobin: 8.6 g/dL — ABNORMAL LOW (ref 12.0–15.0)
LYMPHS PCT: 12 %
Lymphs Abs: 2.5 10*3/uL (ref 0.7–4.0)
MCH: 27.4 pg (ref 26.0–34.0)
MCHC: 33.2 g/dL (ref 30.0–36.0)
MCV: 82.5 fL (ref 78.0–100.0)
MONO ABS: 1.6 10*3/uL — AB (ref 0.1–1.0)
MONOS PCT: 8 %
NRBC: 1 /100{WBCs} — AB
Neutro Abs: 16.2 10*3/uL — ABNORMAL HIGH (ref 1.7–7.7)
Neutrophils Relative %: 79 %
PLATELETS: 473 10*3/uL — AB (ref 150–400)
RBC: 3.14 MIL/uL — AB (ref 3.87–5.11)
RDW: 21.3 % — ABNORMAL HIGH (ref 11.5–15.5)
WBC: 20.5 10*3/uL — AB (ref 4.0–10.5)

## 2017-08-02 LAB — RETICULOCYTES
RBC.: 3.14 MIL/uL — AB (ref 3.87–5.11)
Retic Count, Absolute: 405.1 10*3/uL — ABNORMAL HIGH (ref 19.0–186.0)
Retic Ct Pct: 12.9 % — ABNORMAL HIGH (ref 0.4–3.1)

## 2017-08-02 MED ORDER — HYDROMORPHONE 1 MG/ML IV SOLN
INTRAVENOUS | Status: DC
  Administered 2017-08-02: 11:00:00 via INTRAVENOUS
  Administered 2017-08-02: 12.5 mg via INTRAVENOUS
  Filled 2017-08-02: qty 25

## 2017-08-02 MED ORDER — SODIUM CHLORIDE 0.9 % IV SOLN
25.0000 mg | INTRAVENOUS | Status: DC | PRN
  Filled 2017-08-02: qty 0.5

## 2017-08-02 MED ORDER — ONDANSETRON HCL 4 MG/2ML IJ SOLN: 4.0000 mg | Freq: Four times a day (QID) | INTRAMUSCULAR | Status: DC | PRN

## 2017-08-02 MED ORDER — KETOROLAC TROMETHAMINE 30 MG/ML IJ SOLN
15.0000 mg | Freq: Once | INTRAMUSCULAR | Status: AC
  Administered 2017-08-02: 15 mg via INTRAVENOUS
  Filled 2017-08-02: qty 1

## 2017-08-02 MED ORDER — SODIUM CHLORIDE 0.9% FLUSH: 9.0000 mL | INTRAVENOUS | Status: DC | PRN

## 2017-08-02 MED ORDER — NALOXONE HCL 0.4 MG/ML IJ SOLN: 0.4000 mg | INTRAMUSCULAR | Status: DC | PRN

## 2017-08-02 MED ORDER — HEPARIN SOD (PORK) LOCK FLUSH 100 UNIT/ML IV SOLN
500.0000 [IU] | INTRAVENOUS | Status: AC | PRN
  Administered 2017-08-02: 500 [IU]
  Filled 2017-08-02: qty 5

## 2017-08-02 MED ORDER — SODIUM CHLORIDE 0.9% FLUSH: 10.0000 mL | INTRAVENOUS | Status: DC | PRN

## 2017-08-02 MED ORDER — OXYCODONE HCL 5 MG PO TABS
20.0000 mg | ORAL_TABLET | Freq: Once | ORAL | Status: AC
  Administered 2017-08-02: 20 mg via ORAL
  Filled 2017-08-02: qty 4

## 2017-08-02 MED ORDER — DIPHENHYDRAMINE HCL 25 MG PO CAPS: 25.0000 mg | ORAL_CAPSULE | ORAL | Status: DC | PRN

## 2017-08-02 MED ORDER — DEXTROSE-NACL 5-0.45 % IV SOLN
INTRAVENOUS | Status: DC
  Administered 2017-08-02: 11:00:00 via INTRAVENOUS

## 2017-08-02 NOTE — Telephone Encounter (Signed)
Patient called requesting to be seen in day hospital for sickle cell pain crisis.  Patient reports pain and swelling in right arm rated 10/10. Patient denies fever, chest pain abdominal pain, nausea and vomiting. [place], [place] notified and advised patient to go to ED first to rule out cellulitis in right arm. Patient can transition to to day hospital if cleared in ED. Patient called and notified. Patient upset and insists on coming to day hospital to be treated. Provider notified and attempted to reach patient.

## 2017-08-02 NOTE — Progress Notes (Signed)
Patient admitted to day hospital at patient care center for treatment of sickle cell pain crisis. Patient complains of pain and swelling in right arm rated 10/10. Patient placed on Dilaudid PCA, given IV Toradol, 20 mg Oxycodone and hydrated with IV fluids.  At discharge patient rates pain at 7/10. Discharge instructions given to patient. Patient alert, oriented and ambulatory at discharge.

## 2017-08-02 NOTE — H&P (Signed)
[facility] History and Physical   Date: 08/02/2017  Patient name: [name] Medical record number: [id number] Date of birth: Oct 27, 1990 Age: 27 y.o. Gender: female PCP: Scot Jun, FNP  Attending physician: Tresa Garter, MD  Chief Complaint: right arm pain  History of Present Illness: [name], a 27 year old female with a history of sickle cell anemia, hemoglobin SS presents complaining of right arm pain that is consistent with typical sickle cell crisis.  Patient states that she was awakened from sleep with throbbing pain to right arm this a.m.  Patient has been out of chronic pain medications over the past several days.  She took gabapentin on yesterday without sustained relief.  Current pain intensity is 9/10 characterized as constant and throbbing.  Patient was recently started on hydroxyurea and has been taking medication consistently over the past 4 days.  She denies headache, shortness of breath, dizziness, paresthesias, dysuria, nausea, vomiting, or diarrhea.  Patient will be admitted to the day infusion center for pain management and extended observation.  Meds: Medications Prior to Admission  Medication Sig Dispense Refill Last Dose  . aspirin-acetaminophen-caffeine (EXCEDRIN MIGRAINE) 250-250-65 MG tablet Take 2 tablets every 6 (six) hours as needed by mouth for headache.   Taking  . cyclobenzaprine (FLEXERIL) 10 MG tablet Take 1 tablet (10 mg total) by mouth 3 (three) times daily as needed for muscle spasms. 30 tablet 0   . gabapentin (NEURONTIN) 300 MG capsule Take 1 capsule (300 mg total) by mouth 3 (three) times daily. 90 capsule 3   . hydroxyurea (HYDREA) 500 MG capsule Take 2 tabs (1000 mg ) PO QOD starting on 06/02/2017 alternating with 1 tab (500 mg) PO QOD starting on 06/03/2017. May take with food to minimize GI side effects. 60 capsule 3   . medroxyPROGESTERone (DEPO-PROVERA) 150 MG/ML injection Inject 1 mL (150 mg total) into  the muscle every 3 (three) months. 1 mL 3 Taking  . morphine (MS CONTIN) 30 MG 12 hr tablet Take 1 tablet (30 mg total) by mouth every 12 (twelve) hours. 4 tablet 0   . Oxycodone HCl 20 MG TABS Take 1 tablet (20 mg total) by mouth every 4 (four) hours as needed (pain). 12 tablet 0   . rivaroxaban (XARELTO) 20 MG TABS tablet Take 1 tablet (20 mg total) by mouth daily with supper. 30 tablet 3     Allergies: Carrot [daucus carota] and Latex Past Medical History:  Diagnosis Date  . Blood transfusion    "lots"  . Blood transfusion without reported diagnosis   . Chronic back pain    "very severe; have knot in my back; from tight muscle; take RX and exercise for it"  . Depression 01/06/2011  . Exertional dyspnea    "sometimes"  . Genital HSV   . GERD (gastroesophageal reflux disease) 02/17/2011  . Migraines 11/08/11   "@ least twice/month"  . 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  . Mood swings 11/08/11   "I go back and forth; real bad"  . Sickle cell anemia ([name])   . Sickle cell anemia with crisis ([facility])   . Thrombocytosis ([place]) 11/09/2015  . Trichotillomania    h/o   Past Surgical History:  Procedure Laterality Date  . CHOLECYSTECTOMY  05/2010  . DILATION AND CURETTAGE OF UTERUS  02/20/11   S/P miscarriage  . IR GENERIC HISTORICAL  12/23/2015   IR FLUORO GUIDE CV LINE RIGHT 12/23/2015 Jacqulynn Cadet, MD [facility]-INTER[facility] RAD  .  IR GENERIC HISTORICAL  12/23/2015   IR US GUIDE VASC ACCESS RIGHT 12/23/2015 Jacqulynn Cadet, MD [facility]   Family History  Problem Relation Age of Onset  . Sickle cell trait Mother   . Sickle cell trait Father   . Diabetes Maternal Grandmother   . Diabetes Paternal Grandmother   . Hypertension Paternal Grandmother   . Diabetes Maternal Grandfather    Social History   Socioeconomic History  . Marital status: Single    Spouse name: Not on file  . Number of children: Not on file  . Years of education: Not on file  . Highest education level: Not  on file  Social Needs  . Financial resource strain: Not on file  . Food insecurity - worry: Not on file  . Food insecurity - inability: Not on file  . Transportation needs - medical: Not on file  . Transportation needs - non-medical: Not on file  Occupational History  . Not on file  Tobacco Use  . Smoking status: Former Smoker    Packs/day: 0.25    Years: 1.00    Pack years: 0.25    Types: Cigarettes    Last attempt to quit: 03/25/2013    Years since quitting: 4.3  . Smokeless tobacco: Never Used  Substance and Sexual Activity  . Alcohol use: No    Comment: denies  . Drug use: No    Comment: "Clean for 3 months"  . Sexual activity: Yes    Partners: Male    Birth control/protection: None    Comment: last sex Jul 11 2015  Other Topics Concern  . Not on file  Social History Narrative   Lives  With mother   FOB is supportive-supposed to be moving next month   Is a student at [employer] time   Review of Systems  Constitutional: Negative.   HENT: Negative.   Eyes: Negative.   Respiratory: Negative.   Cardiovascular: Negative.   Gastrointestinal: Negative.   Genitourinary: Negative.   Musculoskeletal: Positive for joint pain (right elbow).  Skin: Negative.   Neurological: Negative.   Endo/Heme/Allergies: Negative.   Psychiatric/Behavioral: Negative for depression, substance abuse and suicidal ideas.   Physical Exam: not currently breastfeeding. BP 113/82 (BP Location: Right Arm)   Pulse 90   Temp 98 F (36.7 C) (Oral)   Resp 11   SpO2 98%   General Appearance:    Alert, cooperative, moderate distress, tearful, appears stated age  Head:    Normocephalic, without obvious abnormality, atraumatic  Eyes:    PERRL, conjunctiva/corneas clear, EOM's intact, fundi    benign, both eyes  Ears:    Normal TM's and external ear canals, both ears  Nose:   Nares normal, septum midline, mucosa normal, no drainage    or sinus tenderness  Throat:   Lips, mucosa, and tongue normal;  teeth and gums normal  Neck:   Supple, symmetrical, trachea midline, no adenopathy;    thyroid:  no enlargement/tenderness/nodules; no carotid   bruit or JVD  Back:     Symmetric, no curvature, ROM normal, no CVA tenderness  Lungs:     Clear to auscultation bilaterally, respirations unlabored  Chest Wall:    No tenderness or deformity   Heart:    Regular rate and rhythm, S1 and S2 normal, no murmur, rub   or gallop  Abdomen:     Soft, non-tender, bowel sounds active all four quadrants,    no masses, no organomegaly  Extremities:   Right elbow  tenderness, atraumatic, no cyanosis or edema  Pulses:   2+ and symmetric all extremities  Skin:   Skin color, texture, turgor normal, no rashes or lesions  Lymph nodes:   Cervical, supraclavicular, and axillary nodes normal  Neurologic:   CNII-XII intact, normal strength, sensation and reflexes    throughout    Lab results: No results found. However, due to the size of the patient record, not all encounters were searched. Please check Results Review for a complete set of results.  Imaging results:  No results found.   Assessment & Plan:  Patient will be admitted to the day infusion center for extended observation  Start IV D5.45 for cellular rehydration at 125/hr  Start Toradol 15 mg IV every 6 hours for inflammation.  Start Dilaudid PCA High Concentration per weight based protocol.   Patient will be re-evaluated for pain intensity in the context of function and relationship to baseline as care progresses.  If no significant pain relief, will transfer patient to inpatient services for a higher level of care.   Will check CMP, reticulocytes, and CBC w/differenitial   Corey Caulfield M 08/02/2017, 10:40 AM

## 2017-08-02 NOTE — Discharge Summary (Signed)
[facility] Discharge Summary   Patient ID: [name] MRN: [id number] DOB/AGE: [birth year] 27 y.o.  Admit date: 08/02/2017 Discharge date: 08/02/2017  Primary Care Physician:  Scot Jun, FNP  Admission Diagnoses:  Active Problems:   Hb-SS disease with crisis [facility])  Discharge Medications:  Allergies as of 08/02/2017      Reactions   Carrot [daucus Carota] Hives, Swelling   Allergic to carrots   Latex Rash      Medication List    TAKE these medications   aspirin-acetaminophen-caffeine 250-250-65 MG tablet Commonly known as:  EXCEDRIN MIGRAINE Take 2 tablets every 6 (six) hours as needed by mouth for headache.   cyclobenzaprine 10 MG tablet Commonly known as:  FLEXERIL Take 1 tablet (10 mg total) by mouth 3 (three) times daily as needed for muscle spasms.   gabapentin 300 MG capsule Commonly known as:  NEURONTIN Take 1 capsule (300 mg total) by mouth 3 (three) times daily.   hydroxyurea 500 MG capsule Commonly known as:  HYDREA Take 2 tabs (1000 mg ) PO QOD starting on 06/02/2017 alternating with 1 tab (500 mg) PO QOD starting on 06/03/2017. May take with food to minimize GI side effects.   medroxyPROGESTERone 150 MG/ML injection Commonly known as:  DEPO-PROVERA Inject 1 mL (150 mg total) into the muscle every 3 (three) months.   morphine 30 MG 12 hr tablet Commonly known as:  MS CONTIN Take 1 tablet (30 mg total) by mouth every 12 (twelve) hours.   Oxycodone HCl 20 MG Tabs Take 1 tablet (20 mg total) by mouth every 4 (four) hours as needed (pain).   rivaroxaban 20 MG Tabs tablet Commonly known as:  XARELTO Take 1 tablet (20 mg total) by mouth daily with supper.        Consults:  None  Significant Diagnostic Studies:  Dg Chest 2 View  Result Date: 07/10/2017 CLINICAL DATA:  Chest pain.  History of sickle cell disease. EXAM: CHEST  2 VIEW COMPARISON:  07/05/2017 FINDINGS: Porta catheter in good position with tip at the distal  SVC. There is no edema, consolidation, effusion, or pneumothorax. Normal heart size and mediastinal contours. 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 IMPRESSION: No evidence of active disease. Lower lobe opacities on prior are no longer seen. Electronically Signed   By: Monte Fantasia M.D.   On: 07/10/2017 06:52   Dg Chest 2 View  Result Date: 07/05/2017 CLINICAL DATA:  Fever.  Chest pain under both breasts. EXAM: CHEST  2 VIEW COMPARISON:  05/29/2017 FINDINGS: Borderline heart size. Stable mediastinal contours. Porta catheter on the right with tip at the SVC level. There is streaky density at the bases with trace pleural effusions. Mild fissural thickening and airway cuffing. IMPRESSION: 1. Mild lung opacity at the bases with CT appearance yesterday favoring atelectasis over infection. Need clinical correlation for acute chest syndrome. 2. Trace pleural effusions and airway cuffing, is there volume overload in this patient with mild cardiomegaly? Electronically Signed   By: Monte Fantasia M.D.   On: 07/05/2017 17:01   Ct Abdomen W Contrast  Result Date: 07/04/2017 CLINICAL DATA:  Right upper quadrant pain history of sickle cell EXAM: CT ABDOMEN WITH CONTRAST TECHNIQUE: Multidetector CT imaging of the abdomen was performed using the standard protocol following bolus administration of intravenous contrast. CONTRAST:  156mL ISOVUE-300 IOPAMIDOL (ISOVUE-300) INJECTION 61% COMPARISON:  CT 02/27/2017, 02/26/2016, 06/22/2012 FINDINGS: Lower chest: Lung bases demonstrate small pleural effusions. Patchy dependent consolidations and ground-glass  densities. Mild cardiomegaly. Hepatobiliary: Liver is enlarged, with craniocaudad measurement of 22 cm. No focal hepatic abnormality is seen. Surgical clips at the gallbladder fossa. No biliary dilatation. Pancreas: Unremarkable. No pancreatic ductal dilatation or surrounding inflammatory changes. Spleen: Heterogeneous with multiple hypodense  nodules, stable compared with multiple prior studies Adrenals/Urinary Tract: Adrenal glands are within normal limits. Kidneys are unremarkable Stomach/Bowel: Stomach within normal limits. Visible colon and small bowel are within normal limits. Vascular/Lymphatic: Nonaneurysmal upper abdominal aorta. No significantly enlarged lymph nodes. Other: Negative for free air or ascites in the upper abdomen Musculoskeletal: Bony changes at the spine consistent with history of sickle cell disease. IMPRESSION: 1. Hepatomegaly without focal hepatic abnormality or biliary dilatation 2. Status post cholecystectomy 3. Small pleural effusions. Patchy dependent consolidations and ground-glass densities, favor atelectasis, less likely infection. 4. Stable diffuse nodularity of the spleen Electronically Signed   By: Donavan Foil M.D.   On: 07/04/2017 18:53     [facility] Course: Shanena time is a 27 year old female with a history of sickle cell, hemoglobin SS presents complaining of right arm pain that is consistent with previous sickle cell crisis.  Patient states that right arm pain started around 4 AM.  Pain intensity was 9/10 on arrival characterized as constant and throbbing.  Patient has been out of prescribe chronic opiate medications over the past several days.  She last had gabapentin on yesterday without sustained relief.  Patient was admitted to the day infusion center for pain management and extended observation. Hypotonic IV fluids started at 125 mL/h Toradol 15 mg x1 for inflammation Dilaudid PCA per weight-based protocol.  Patient used a total of 12.5 mg with 24 demands and 22 deliveries.  Oxycodone 20 mg x 1 Pain intensity decreased to 7 out of 10.  Patient states that right arm  continues to be painful.  Started admission process, however patient states that she cannot be admitted due to child care constraints.  Patient and I discussed situation at length.  We agreed that she would be  discharged home and if pain intensity continues to increase, she will report to the emergency department.  Patient can also follow-up in the patient care center on tomorrow.  Patient expressed understanding. Patient alert, oriented, and ambulating. We will discharge home in stable condition.   Discharge instructions: Resume all home medications Follow-up with primary provider in 1 week Increase water intake to 64 ounces per day  The patient was given clear instructions to go to ER or return to medical center if symptoms do not improve, worsen or new problems develop. The patient verbalized understanding.    Physical Exam at Discharge:   BP 113/67 (BP Location: Left Arm)   Pulse (!) 104   Temp 98.7 F (37.1 C) (Oral)   Resp 16   SpO2 100%   General Appearance:    Alert, cooperative,  Mild distress, appears stated age  Head:    Normocephalic, without obvious abnormality, atraumatic  Neck:   Supple, symmetrical, trachea midline, no adenopathy;    thyroid:  no enlargement/tenderness/nodules; no carotid   bruit or JVD  Back:     Symmetric, no curvature, ROM normal, no CVA tenderness  Lungs:     Clear to auscultation bilaterally, respirations unlabored  Chest Wall:    No tenderness or deformity   Heart:    Regular rate and rhythm, S1 and S2 normal, no murmur, rub   or gallop  Abdomen:     Soft, non-tender, bowel sounds active  all four quadrants,    no masses, no organomegaly  Extremities:   Extremities normal, atraumatic, no cyanosis or edema  Pulses:   2+ and symmetric all extremities  Skin:   Skin color, texture, turgor normal, no rashes or lesions  Lymph nodes:   Cervical, supraclavicular, and axillary nodes normal  Neurologic:   CNII-XII intact, normal strength, sensation and reflexes    throughout    Disposition at Discharge: Discharge disposition: 01-Home or Self Care       Discharge Orders: Discharge Instructions    Discharge patient   Complete by:  As directed     Discharge disposition:  01-Home or Self Care   Discharge patient date:  08/02/2017      Condition at Discharge:   Stable  Time spent on Discharge:  Greater than 30 minutes.  Signed: Channing [name] M 08/02/2017, 4:22 PM

## 2017-08-02 NOTE — Telephone Encounter (Signed)
Office visit notes are complete. Please process referrals to [facility] pain management.    Carroll Sage. Kenton Kingfisher, MSN, FNP-C [facility]  [name] [place], [place] [address] [phone]

## 2017-08-02 NOTE — Discharge Instructions (Signed)
My recommendation was inpatient admission. Unfortunately, patient was unable to be admitted due to family constraints.  The patient was given clear instructions to go to ER or return to medical center if symptoms do not improve, worsen or new problems develop. The patient verbalized understanding.   Resume all home medications and follow up with PCP in 1 week.     Sickle Cell Anemia, Adult Sickle cell anemia is a condition where your red blood cells are shaped like sickles. Red blood cells carry oxygen through the body. Sickle-shaped red blood cells do not live as long as normal red blood cells. They also clump together and block blood from flowing through the blood vessels. These things prevent the body from getting enough oxygen. Sickle cell anemia causes organ damage and pain. It also increases the risk of infection. Follow these instructions at home:  Drink enough fluid to keep your pee (urine) clear or pale yellow. Drink more in hot weather and during exercise.  Do not smoke. Smoking lowers oxygen levels in the blood.  Only take over-the-counter or prescription medicines as told by your doctor.  Take antibiotic medicines as told by your doctor. Make sure you finish them even if you start to feel better.  Take supplements as told by your doctor.  Consider wearing a medical alert bracelet. This tells anyone caring for you in an emergency of your condition.  When traveling, keep your medical information, doctors' names, and the medicines you take with you at all times.  If you have a fever, do not take fever medicines right away. This could cover up a problem. Tell your doctor.  Keep all follow-up visits with your doctor. Sickle cell anemia requires regular medical care. Contact a doctor if: You have a fever. Get help right away if:  You feel dizzy or faint.  You have new belly (abdominal) pain, especially on the left side near the stomach area.  You have a lasting, often  uncomfortable and painful erection of the penis (priapism). If it is not treated right away, you will become unable to have sex (impotence).  You have numbness in your arms or legs or you have a hard time moving them.  You have a hard time talking.  You have a fever or lasting symptoms for more than 2-3 days.  You have a fever and your symptoms suddenly get worse.  You have signs or symptoms of infection. These include: ? Chills. ? Being more tired than normal (lethargy). ? Irritability. ? Poor eating. ? Throwing up (vomiting).  You have pain that is not helped with medicine.  You have shortness of breath.  You have pain in your chest.  You are coughing up pus-like or bloody mucus.  You have a stiff neck.  Your feet or hands swell or have pain.  Your belly looks bloated.  Your joints hurt. This information is not intended to replace advice given to you by your health care provider. Make sure you discuss any questions you have with your health care provider. Document Released: 02/20/2013 Document Revised: 10/08/2015 Document Reviewed: 12/12/2012 Elsevier Interactive Patient Education  2017 [employer].

## 2017-08-03 ENCOUNTER — Encounter (HOSPITAL_COMMUNITY): Payer: Self-pay | Admitting: Emergency Medicine

## 2017-08-03 ENCOUNTER — Emergency Department (HOSPITAL_COMMUNITY)
Admission: EM | Admit: 2017-08-03 | Discharge: 2017-08-04 | Disposition: A | Discharge status: Home / Self Care | Payer: Medicare Other | Attending: Emergency Medicine | Admitting: Emergency Medicine

## 2017-08-03 ENCOUNTER — Other Ambulatory Visit: Payer: Self-pay

## 2017-08-03 DIAGNOSIS — Z87891 Personal history of nicotine dependence: Secondary | ICD-10-CM | POA: Diagnosis not present

## 2017-08-03 DIAGNOSIS — Z91040 Latex allergy status: Secondary | ICD-10-CM | POA: Diagnosis not present

## 2017-08-03 DIAGNOSIS — M79601 Pain in right arm: Secondary | ICD-10-CM | POA: Diagnosis not present

## 2017-08-03 DIAGNOSIS — D5700 Hb-SS disease with crisis, unspecified: Secondary | ICD-10-CM | POA: Insufficient documentation

## 2017-08-03 LAB — CBC WITH DIFFERENTIAL/PLATELET
Basophils Absolute: 0.1 10*3/uL (ref 0.0–0.1)
Basophils Relative: 1 %
EOS PCT: 2 %
Eosinophils Absolute: 0.3 10*3/uL (ref 0.0–0.7)
HEMATOCRIT: 25.2 % — AB (ref 36.0–46.0)
HEMOGLOBIN: 8.4 g/dL — AB (ref 12.0–15.0)
LYMPHS ABS: 3.8 10*3/uL (ref 0.7–4.0)
Lymphocytes Relative: 23 %
MCH: 27.7 pg (ref 26.0–34.0)
MCHC: 33.3 g/dL (ref 30.0–36.0)
MCV: 83.2 fL (ref 78.0–100.0)
MONO ABS: 1.7 10*3/uL — AB (ref 0.1–1.0)
MONOS PCT: 11 %
NEUTROS ABS: 10.3 10*3/uL — AB (ref 1.7–7.7)
Neutrophils Relative %: 63 %
Platelets: 476 10*3/uL — ABNORMAL HIGH (ref 150–400)
RBC: 3.03 MIL/uL — ABNORMAL LOW (ref 3.87–5.11)
RDW: 21.7 % — AB (ref 11.5–15.5)
WBC: 16.2 10*3/uL — ABNORMAL HIGH (ref 4.0–10.5)

## 2017-08-03 LAB — BASIC METABOLIC PANEL
Anion gap: 7 (ref 5–15)
BUN: 7 mg/dL (ref 6–20)
CALCIUM: 8.9 mg/dL (ref 8.9–10.3)
CHLORIDE: 105 mmol/L (ref 101–111)
CO2: 26 mmol/L (ref 22–32)
Creatinine, Ser: 0.59 mg/dL (ref 0.44–1.00)
GFR calc non Af Amer: >60 mL/min (ref >60)
GLUCOSE: 86 mg/dL (ref 65–99)
Potassium: 3.7 mmol/L (ref 3.5–5.1)
Sodium: 138 mmol/L (ref 135–145)

## 2017-08-03 LAB — RETICULOCYTES
RBC.: 3.03 MIL/uL — AB (ref 3.87–5.11)
Retic Count, Absolute: 390.9 10*3/uL — ABNORMAL HIGH (ref 19.0–186.0)
Retic Ct Pct: 12.9 % — ABNORMAL HIGH (ref 0.4–3.1)

## 2017-08-03 MED ORDER — DIPHENHYDRAMINE HCL 50 MG/ML IJ SOLN
25.0000 mg | Freq: Once | INTRAMUSCULAR | Status: AC
  Administered 2017-08-03: 25 mg via INTRAVENOUS
  Filled 2017-08-03: qty 1

## 2017-08-03 MED ORDER — KETOROLAC TROMETHAMINE 30 MG/ML IJ SOLN
30.0000 mg | INTRAMUSCULAR | Status: AC
  Administered 2017-08-03: 30 mg via INTRAVENOUS
  Filled 2017-08-03: qty 1

## 2017-08-03 MED ORDER — HYDROMORPHONE HCL 2 MG/ML IJ SOLN
2.0000 mg | INTRAMUSCULAR | Status: AC
  Administered 2017-08-03 – 2017-08-04 (×3): 2 mg via INTRAVENOUS
  Filled 2017-08-03 (×3): qty 1

## 2017-08-03 MED ORDER — DEXTROSE-NACL 5-0.45 % IV SOLN
INTRAVENOUS | Status: DC
  Administered 2017-08-03: via INTRAVENOUS

## 2017-08-03 MED ORDER — ONDANSETRON HCL 4 MG/2ML IJ SOLN
4.0000 mg | INTRAMUSCULAR | Status: DC | PRN
  Administered 2017-08-03: 4 mg via INTRAVENOUS
  Filled 2017-08-03: qty 2

## 2017-08-03 NOTE — ED Triage Notes (Signed)
Pt arriving from home with sickle cell pain in the right arm.

## 2017-08-03 NOTE — ED Provider Notes (Signed)
[facility] DEPT Provider Note   CSN: [id number] Arrival date & time: 08/03/17  2100     History   Chief Complaint Chief Complaint  Patient presents with  . Sickle Cell Pain Crisis    HPI [name] is a 27 y.o. female.  27 year old female with history of sickle cell anemia presents to the emergency department for evaluation of right arm pain.  She states that pain has been aching, throbbing and constant over the past few days.  She was seen at the sickle cell clinic yesterday for pain control.  She states that she was advised to be admitted, but declined because she needed to go to her new hematology appointment.  She has been taking her home oxycodone without relief of her pain.  No decreased range of motion of her right arm or history of trauma/injury.  She reports a history of similar pain associated with sickle cell crisis.  No associated fever, chest pain, shortness of breath, vomiting, diarrhea, extremity weakness.     Past Medical History:  Diagnosis Date  . Blood transfusion    "lots"  . Blood transfusion without reported diagnosis   . Chronic back pain    "very severe; have knot in my back; from tight muscle; take RX and exercise for it"  . Depression 01/06/2011  . Exertional dyspnea    "sometimes"  . Genital HSV   . GERD (gastroesophageal reflux disease) 02/17/2011  . Migraines 11/08/11   "@ least twice/month"  . 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  . Mood swings 11/08/11   "I go back and forth; real bad"  . Sickle cell anemia ([name])   . Sickle cell anemia with crisis ([place])   . Thrombocytosis ([place]) 11/09/2015  . Trichotillomania    h/o    Patient Active Problem List   Diagnosis Date Noted  . 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 Chronic anticoagulation   . Sickle cell anemia ([place]) 05/19/2016  . History of DVT (deep vein thrombosis) 04/17/2016  . Thrombocytosis ([place]) 11/09/2015  .  Hyperbilirubinemia 11/09/2015  . Chronic pain 08/04/2015  . Herpes simplex 07/14/2015  . Anemia of chronic disease   . 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 Hb-SS disease without crisis ([place]) 04-05-92    Past Surgical History:  Procedure Laterality Date  . CHOLECYSTECTOMY  05/2010  . DILATION AND CURETTAGE OF UTERUS  02/20/11   S/P miscarriage  . IR GENERIC HISTORICAL  12/23/2015   IR FLUORO GUIDE CV LINE RIGHT 12/23/2015 Jacqulynn Cadet, MD [facility]  . IR GENERIC HISTORICAL  12/23/2015   IR US GUIDE VASC ACCESS RIGHT 12/23/2015 Jacqulynn Cadet, MD [facility]    OB History    Gravida  5   Para  2   Term  2   Preterm  0   AB  3   Living  2     SAB  3   TAB  0   Ectopic  0   Multiple  0   Live Births  2        Obstetric Comments  Miscarried in October 2012 at about 7 weeks         Home Medications    Prior to Admission medications   Medication Sig Start Date End Date Taking? Authorizing Provider  aspirin-acetaminophen-caffeine (EXCEDRIN MIGRAINE) [id number] MG tablet Take 2 tablets every 6 (six) hours as needed by mouth for headache.   Yes [provider]  cyclobenzaprine (FLEXERIL) 10 MG tablet Take 1 tablet (10 mg total) by mouth 3 (three) times daily as needed for muscle spasms. 07/24/17   Scot Jun, FNP  gabapentin (NEURONTIN) 300 MG capsule Take 1 capsule (300 mg total) by mouth 3 (three) times daily. 07/24/17   Scot Jun, FNP  hydroxyurea (HYDREA) 500 MG capsule Take 2 tabs (1000 mg ) PO QOD starting on 06/02/2017 alternating with 1 tab (500 mg) PO QOD starting on 06/03/2017. May take with food to minimize GI side effects. 07/24/17   Scot Jun, FNP  medroxyPROGESTERone (DEPO-PROVERA) 150 MG/ML injection Inject 1 mL (150 mg total) into the muscle every 3 (three) months. Patient not taking: Reported on 08/03/2017 09/21/16   Shelly Bombard, MD  morphine (MS CONTIN) 30 MG 12 hr tablet Take  1 tablet (30 mg total) by mouth every 12 (twelve) hours. 07/29/17   Vanessa Kick, MD  Oxycodone HCl 20 MG TABS Take 1 tablet (20 mg total) by mouth every 4 (four) hours as needed (pain). 07/29/17   Vanessa Kick, MD  rivaroxaban (XARELTO) 20 MG TABS tablet Take 1 tablet (20 mg total) by mouth daily with supper. 07/24/17   Scot Jun, FNP    Family History Family History  Problem Relation Age of Onset  . Sickle cell trait Mother   . Sickle cell trait Father   . Diabetes Maternal Grandmother   . Diabetes Paternal Grandmother   . Hypertension Paternal Grandmother   . Diabetes Maternal Grandfather     Social History Social History   Tobacco Use  . Smoking status: Former Smoker    Packs/day: 0.25    Years: 1.00    Pack years: 0.25    Types: Cigarettes    Last attempt to quit: 03/25/2013    Years since quitting: 4.3  . Smokeless tobacco: Never Used  Substance Use Topics  . Alcohol use: No    Comment: denies  . Drug use: No    Types: Other-see comments    Comment: "Clean for 3 months"     Allergies   Carrot [daucus carota] and Latex   Review of Systems Review of Systems Ten systems reviewed and are negative for acute change, except as noted in the HPI.    Physical Exam Updated Vital Signs BP (!) 103/45   Pulse (!) 107   Temp 98.7 F (37.1 C) (Oral)   Resp 18   SpO2 95%   Physical Exam  Constitutional: She is oriented to person, place, and time. She appears well-developed and well-nourished. No distress.  Nontoxic appearing and in no distress  HENT:  Head: Normocephalic and atraumatic.  Eyes: Conjunctivae and EOM are normal. No scleral icterus.  Neck: Normal range of motion.  Cardiovascular: Normal rate, regular rhythm and intact distal pulses.  Distal radial pulse 2+ in the right upper extremity  Pulmonary/Chest: Effort normal. No stridor. No respiratory distress. She has no wheezes.  Respirations even and unlabored  Abdominal: Soft. She exhibits no  distension and no mass. There is no tenderness. There is no guarding.  Soft, nontender abdomen.  Obese.  Musculoskeletal: Normal range of motion.  Normal ROM of the RUE; diffuse muscular tenderness without bony deformity or crepitus.  Neurological: She is alert and oriented to person, place, and time. She exhibits normal muscle tone. Coordination normal.  Skin: Skin is warm and dry. No rash noted. She is not diaphoretic. No erythema. No pallor.  Psychiatric: She has a normal  mood and affect. Her behavior is normal.  Nursing note and vitals reviewed.    ED Treatments / Results  Labs (all labs ordered are listed, but only abnormal results are displayed) Labs Reviewed  CBC WITH DIFFERENTIAL/PLATELET - Abnormal; Notable for the following components:      Result Value   WBC 16.2 (*)    RBC 3.03 (*)    Hemoglobin 8.4 (*)    HCT 25.2 (*)    RDW 21.7 (*)    Platelets 476 (*)    Neutro Abs 10.3 (*)    Monocytes Absolute 1.7 (*)    All other components within normal limits  RETICULOCYTES - Abnormal; Notable for the following components:   Retic Ct Pct 12.9 (*)    RBC. 3.03 (*)    Retic Count, Absolute 390.9 (*)    All other components within normal limits  BASIC METABOLIC PANEL    EKG  EKG Interpretation None       Radiology No results found.  Procedures Procedures (including critical care time)  Medications Ordered in ED Medications  dextrose 5 %-0.45 % sodium chloride infusion ( Intravenous New Bag/Given 08/03/17 2335)  ondansetron (ZOFRAN) injection 4 mg (4 mg Intravenous Given 08/03/17 2338)  ketorolac (TORADOL) 30 MG/ML injection 30 mg (30 mg Intravenous Given 08/03/17 2339)  diphenhydrAMINE (BENADRYL) injection 25 mg (25 mg Intravenous Given 08/03/17 2337)  HYDROmorphone (DILAUDID) injection 2 mg (2 mg Intravenous Given 08/04/17 0116)     Initial Impression / Assessment and Plan / ED Course  I have reviewed the triage vital signs and the nursing notes.  Pertinent  labs & imaging results that were available during my care of the patient were reviewed by me and considered in my medical decision making (see chart for details).     27 year old female presents for right upper extremity pain which she attributes to sickle cell crisis.  Pain is aching, throbbing.  Patient neurovascularly intact on exam.  No appreciable upper extremity swelling.  Normal range of motion.  She denies chest pain and shortness of breath.  No recent fevers.  She was seen yesterday at the sickle cell clinic and was recommended to be admitted for management.    Her blood work today is improved compared to this prior evaluation.  She has had improvement in her pain following Toradol, Dilaudid x3, IV fluids.  She believes that she can manage her symptoms further on an outpatient basis.  I believe this is reasonable.  She has been told to follow-up with the sickle cell clinic if symptoms persist.  Return precautions discussed and provided. Patient discharged in stable condition with no unaddressed concerns.   Final Clinical Impressions(s) / ED Diagnoses   Final diagnoses:  Sickle cell pain crisis [facility])    ED Discharge Orders    None       Antonietta Breach, PA-C 08/04/17 0214    Dorie Rank, MD 08/06/17 [phone]

## 2017-08-03 NOTE — Telephone Encounter (Signed)
Referral faxed to [facility] on 08/03/2017 and will be faxed to [facility] on Friday once provider signs form

## 2017-08-04 DIAGNOSIS — D5700 Hb-SS disease with crisis, unspecified: Secondary | ICD-10-CM | POA: Diagnosis not present

## 2017-08-04 MED ORDER — HEPARIN SOD (PORK) LOCK FLUSH 100 UNIT/ML IV SOLN
500.0000 [IU] | Freq: Once | INTRAVENOUS | Status: AC
  Administered 2017-08-04: 500 [IU]
  Filled 2017-08-04: qty 5

## 2017-08-07 DIAGNOSIS — D571 Sickle-cell disease without crisis: Secondary | ICD-10-CM | POA: Diagnosis not present

## 2017-08-07 DIAGNOSIS — Z79899 Other long term (current) drug therapy: Secondary | ICD-10-CM | POA: Diagnosis not present

## 2017-08-07 NOTE — ED Provider Notes (Signed)
[facility]   [id number] 07/29/17 Arrival Time: 0160  ASSESSMENT & PLAN:  1. Arm pain, right   2. Sickle cell crisis ([place])     Meds ordered this encounter  Medications  . Oxycodone HCl 20 MG TABS    Sig: Take 1 tablet (20 mg total) by mouth every 4 (four) hours as needed (pain).    Dispense:  12 tablet    Refill:  0  . morphine (MS CONTIN) 30 MG 12 hr tablet    Sig: Take 1 tablet (30 mg total) by mouth every 12 (twelve) hours.    Dispense:  4 tablet    Refill:  0   After discussion I am willing to prescribe above medications. If she is not seeing quick relief she will proceed to the ED for evaluation +/- admission.  Reviewed expectations re: course of current medical issues. Questions answered. Outlined signs and symptoms indicating need for more acute intervention. Patient verbalized understanding. After Visit Summary given.   SUBJECTIVE: History from: patient. [name] is a 27 y.o. female who presents with complaint of R arm pain typical of sickle cell crisis beginning. Noticed yesterday. No specific aggravating or alleviating factors reported. Was in ED earlier but left before being seen secondary to wait time.  ROS: As per HPI.   OBJECTIVE:  Vitals:   07/29/17 1729  BP: 114/63  Pulse: 92  Resp: 18  Temp: 98.7 F (37.1 C)  TempSrc: Oral  SpO2: 100%    General appearance: alert; no distress Lungs: clear to auscultation bilaterally Heart: regular rate and rhythm Abdomen: soft, non-tender; bowel sounds normal; no masses or organomegaly; no guarding or rebound tenderness Back: no CVA tenderness Extremities: no cyanosis or edema; symmetrical with no gross deformities; generalized R upper extremity pain reported with exam Skin: warm and dry Neurologic: normal gait; normal symmetric reflexes Psychological: alert and cooperative; normal mood and affect  Allergies  Allergen Reactions  . Carrot [Daucus Carota] Hives and Swelling    Allergic  to carrots  . Latex Rash    Past Medical History:  Diagnosis Date  . Blood transfusion    "lots"  . Blood transfusion without reported diagnosis   . Chronic back pain    "very severe; have knot in my back; from tight muscle; take RX and exercise for it"  . Depression 01/06/2011  . Exertional dyspnea    "sometimes"  . Genital HSV   . GERD (gastroesophageal reflux disease) 02/17/2011  . Migraines 11/08/11   "@ least twice/month"  . 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  . Mood swings 11/08/11   "I go back and forth; real bad"  . Sickle cell anemia ([name])   . Sickle cell anemia with crisis ([place])   . Thrombocytosis ([place]) 11/09/2015  . Trichotillomania    h/o   Social History   Socioeconomic History  . Marital status: Single    Spouse name: Not on file  . Number of children: Not on file  . Years of education: Not on file  . Highest education level: Not on file  Occupational History  . Not on file  Social Needs  . Financial resource strain: Not on file  . Food insecurity:    Worry: Not on file    Inability: Not on file  . Transportation needs:    Medical: Not on file    Non-medical: Not on file  Tobacco Use  . Smoking status: Former Smoker    Packs/day: 0.25  Years: 1.00    Pack years: 0.25    Types: Cigarettes    Last attempt to quit: 03/25/2013    Years since quitting: 4.3  . Smokeless tobacco: Never Used  Substance and Sexual Activity  . Alcohol use: No    Comment: denies  . Drug use: No    Types: Other-see comments    Comment: "Clean for 3 months"  . Sexual activity: Yes    Partners: Male    Birth control/protection: None    Comment: last sex Jul 11 2015  Lifestyle  . Physical activity:    Days per week: Not on file    Minutes per session: Not on file  . Stress: Not on file  Relationships  . Social connections:    Talks on phone: Not on file    Gets together: Not on file    Attends religious service: Not on file    Active member of  club or organization: Not on file    Attends meetings of clubs or organizations: Not on file    Relationship status: Not on file  . Intimate partner violence:    Fear of current or ex partner: Not on file    Emotionally abused: Not on file    Physically abused: Not on file    Forced sexual activity: Not on file  Other Topics Concern  . Not on file  Social History Narrative   Lives  With mother   FOB is supportive-supposed to be moving next month   Is a student at [employer] time   Family History  Problem Relation Age of Onset  . Sickle cell trait Mother   . Sickle cell trait Father   . Diabetes Maternal Grandmother   . Diabetes Paternal Grandmother   . Hypertension Paternal Grandmother   . Diabetes Maternal Grandfather    Past Surgical History:  Procedure Laterality Date  . CHOLECYSTECTOMY  05/2010  . DILATION AND CURETTAGE OF UTERUS  02/20/11   S/P miscarriage  . IR GENERIC HISTORICAL  12/23/2015   IR FLUORO GUIDE CV LINE RIGHT 12/23/2015 Jacqulynn Cadet, MD [facility]  . IR GENERIC HISTORICAL  12/23/2015   IR US GUIDE VASC ACCESS RIGHT 12/23/2015 Jacqulynn Cadet, MD [facility]     Vanessa Kick, MD 08/07/17 [phone]

## 2017-08-21 IMAGING — CR DG SHOULDER 2+V*R*
4 series · 4 of 4 positions shown · non-contrast
Comparison: 08/27/2012

CLINICAL DATA: Right shoulder pain for over a month. History of
sickle cell disease.

EXAM:
RIGHT SHOULDER - 2+ VIEW

[t shoulder internal right (1 of 3)]
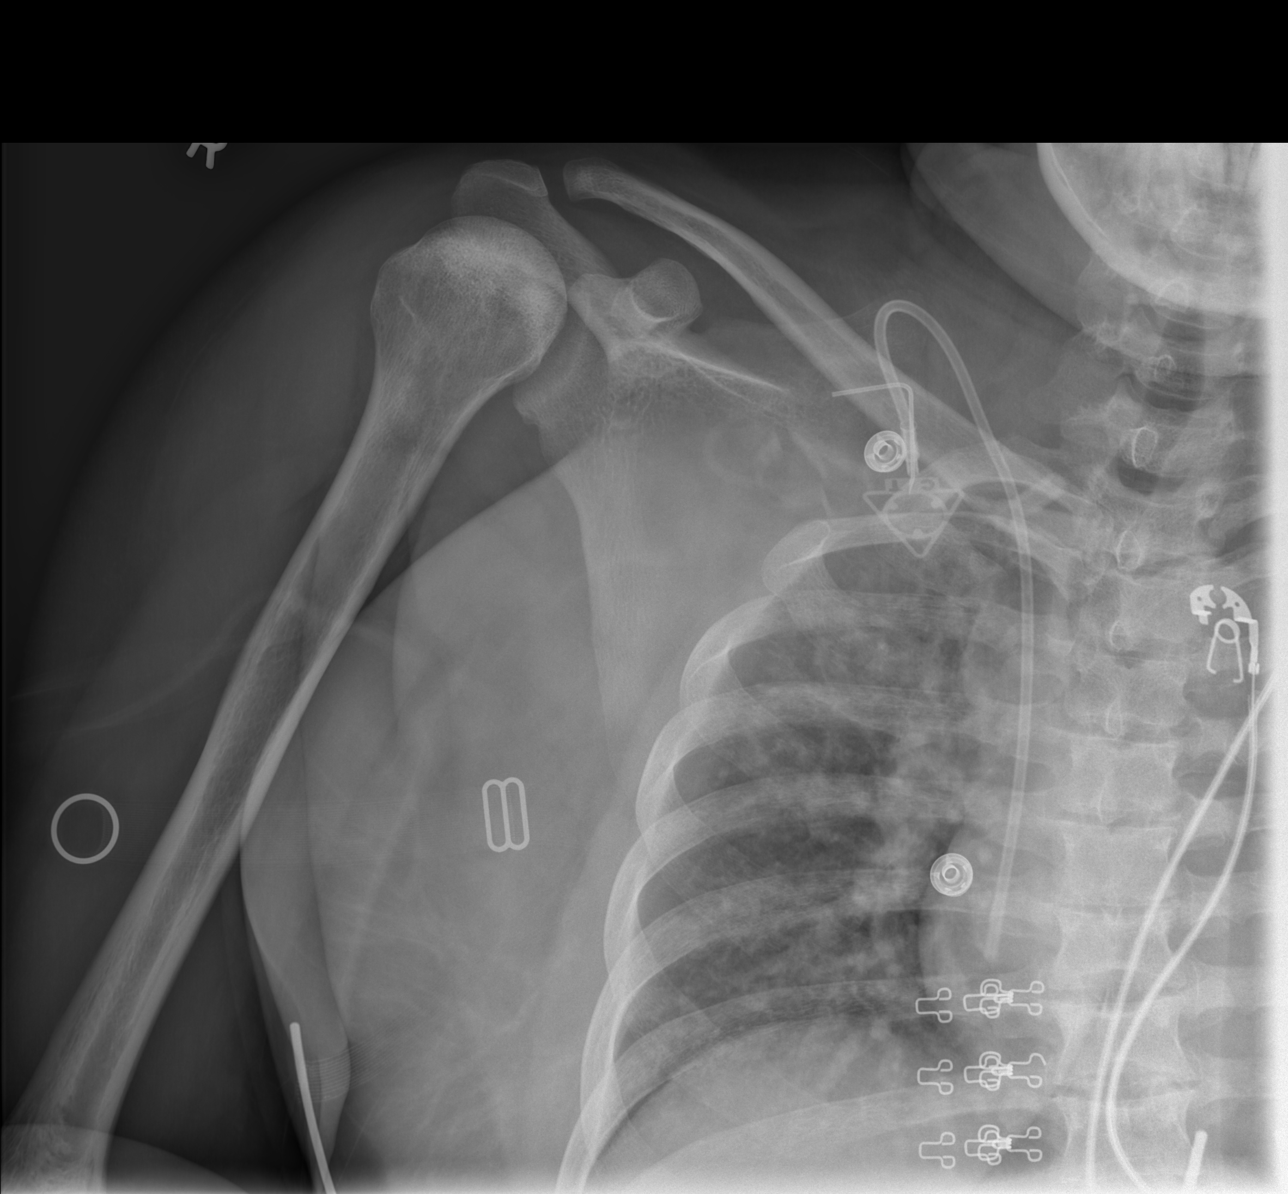

[t shoulder internal right (2 of 3)]
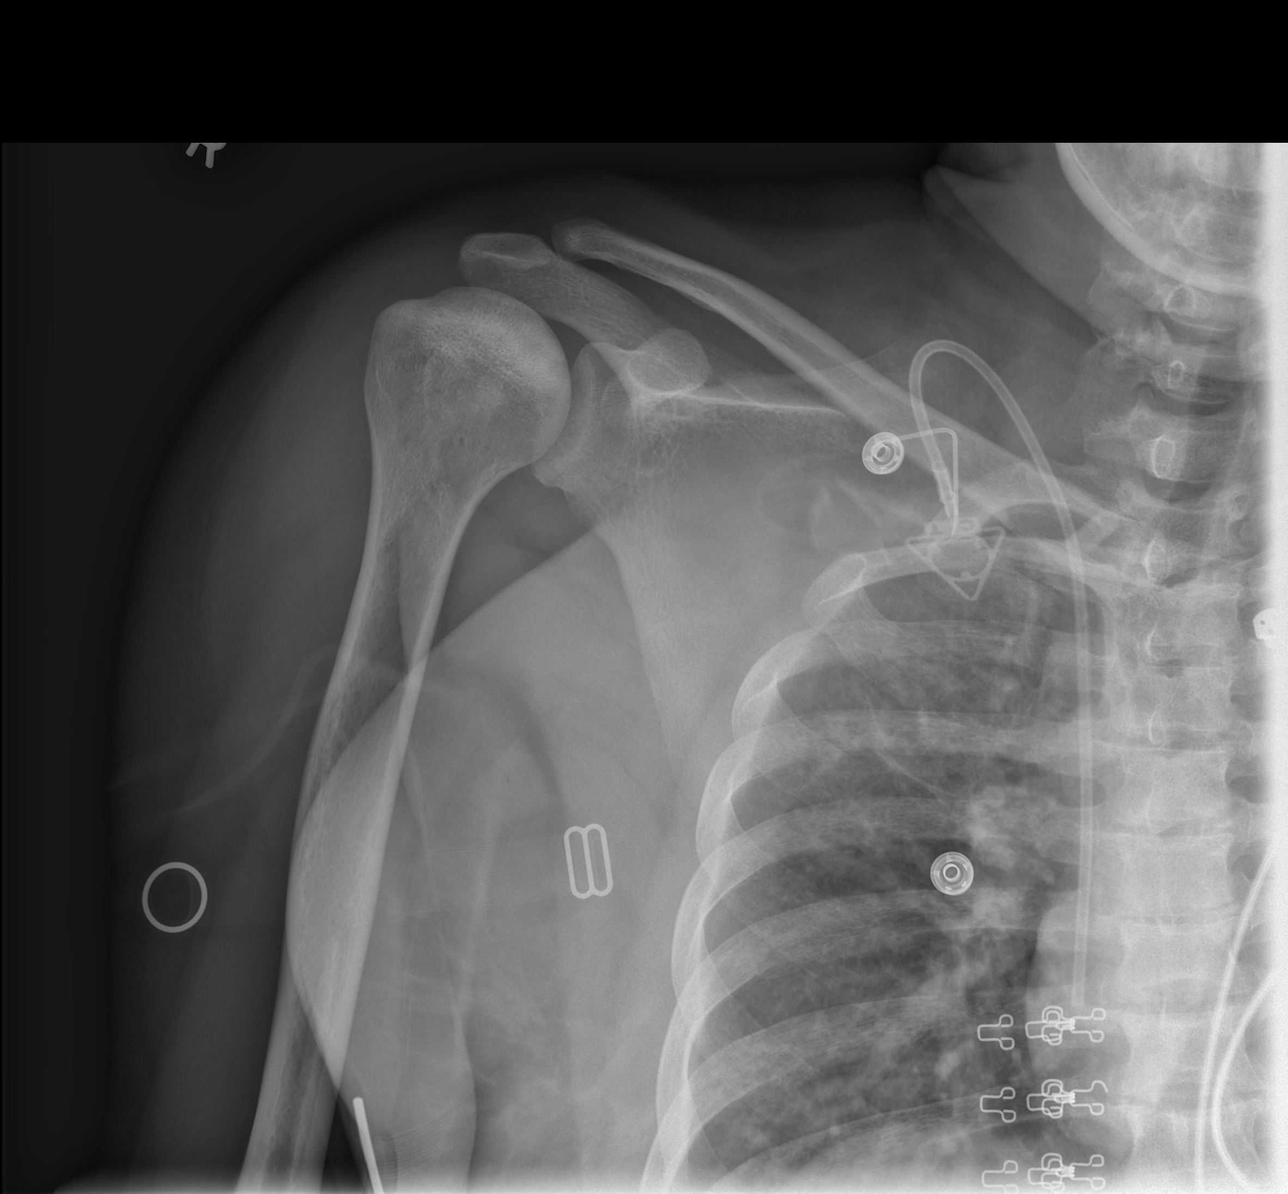

[t shoulder internal right (3 of 3)]
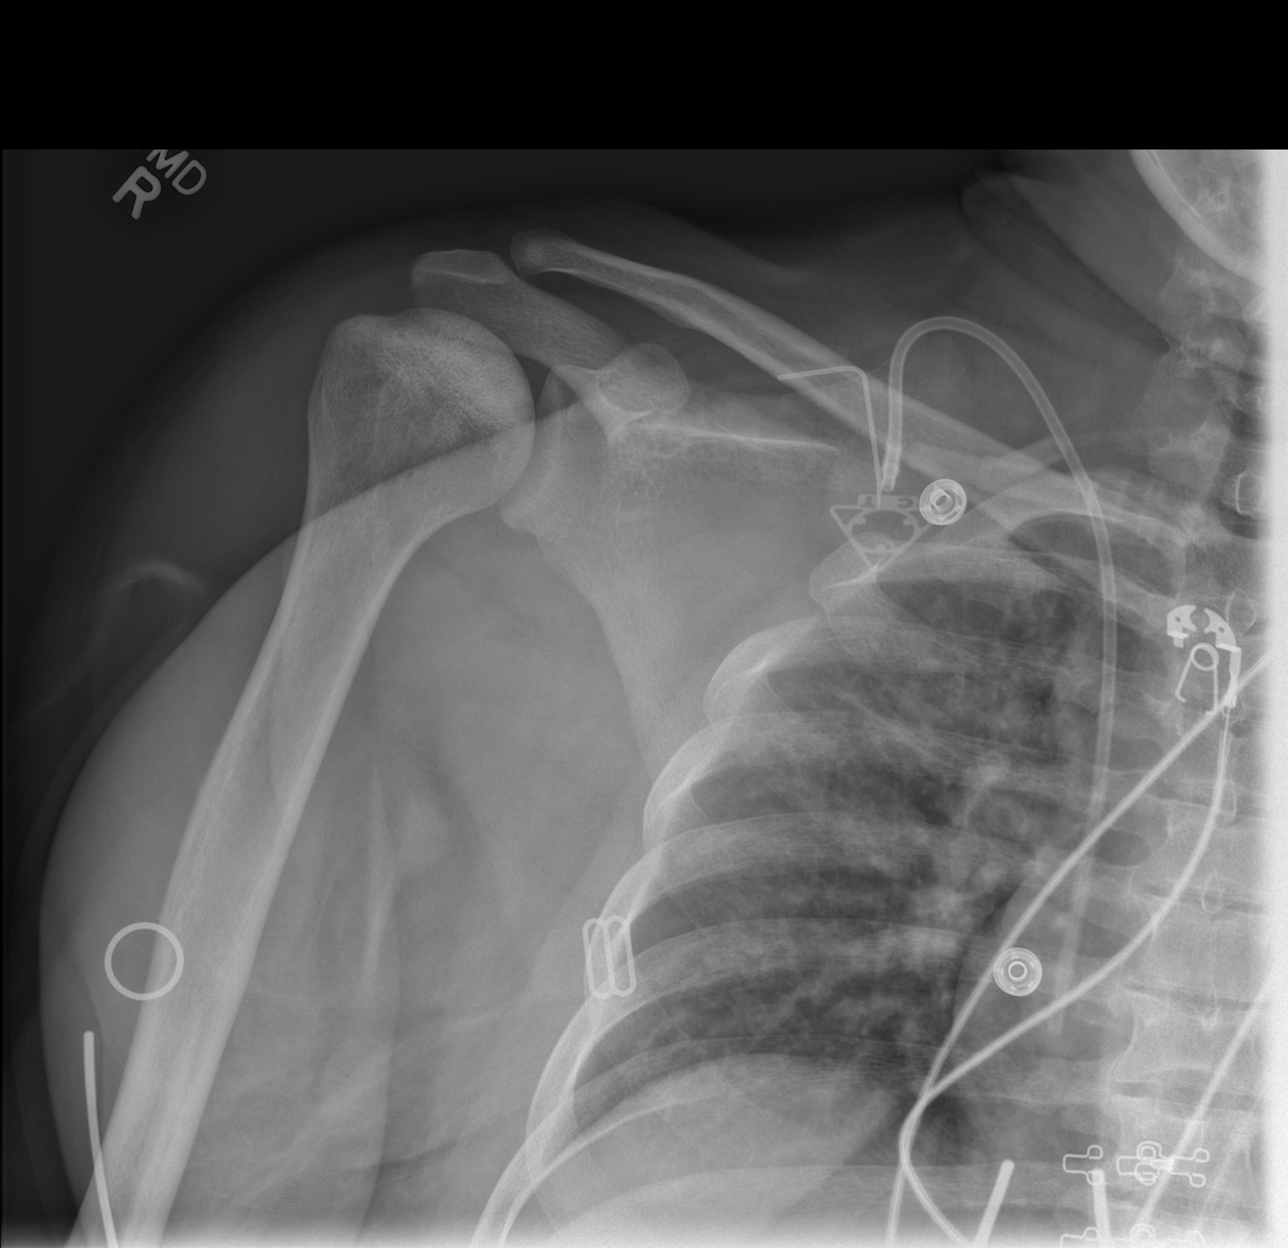

[t shoulder y-view right]
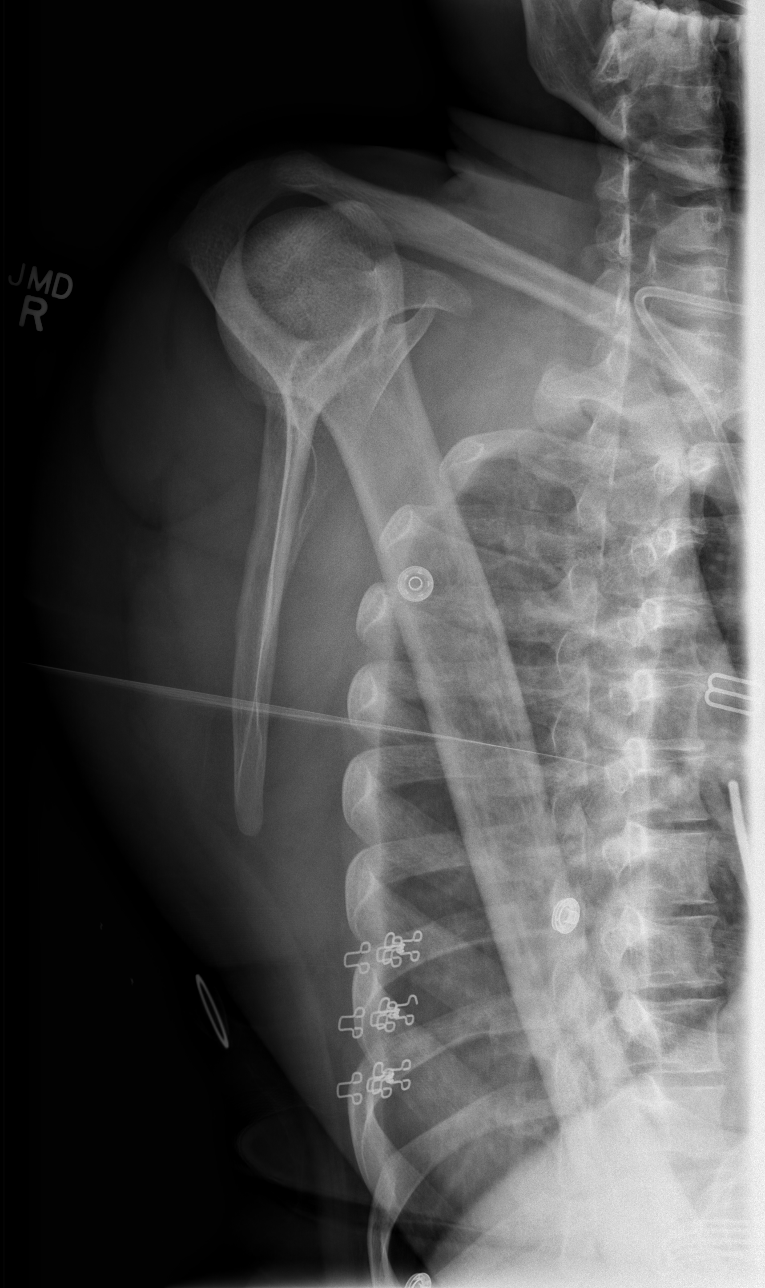

[4 of 4 positions shown; findings below may reference images not displayed]

FINDINGS: Sclerosis in the head of the right humerus consistent with avascular
necrosis, stable in extent from prior. No evidence of fractures/
collapse. No superimposed degenerative changes or malalignment.

Porta catheter on the right.  Mild right upper lobe scarring.
IMPRESSION: AVN of the right humeral head, seen since at least 5400. No acute
superimposed finding or degenerative change.

## 2017-09-01 DIAGNOSIS — D571 Sickle-cell disease without crisis: Secondary | ICD-10-CM | POA: Diagnosis not present

## 2017-09-01 DIAGNOSIS — M87052 Idiopathic aseptic necrosis of left femur: Secondary | ICD-10-CM | POA: Diagnosis not present

## 2017-09-01 DIAGNOSIS — M87051 Idiopathic aseptic necrosis of right femur: Secondary | ICD-10-CM | POA: Diagnosis not present

## 2017-09-01 DIAGNOSIS — D5700 Hb-SS disease with crisis, unspecified: Secondary | ICD-10-CM | POA: Diagnosis not present

## 2017-09-11 DIAGNOSIS — Z765 Malingerer [conscious simulation]: Secondary | ICD-10-CM | POA: Diagnosis not present

## 2017-09-11 DIAGNOSIS — G8929 Other chronic pain: Secondary | ICD-10-CM | POA: Diagnosis not present

## 2017-09-11 DIAGNOSIS — D473 Essential (hemorrhagic) thrombocythemia: Secondary | ICD-10-CM | POA: Diagnosis not present

## 2017-09-11 DIAGNOSIS — D571 Sickle-cell disease without crisis: Secondary | ICD-10-CM | POA: Diagnosis not present

## 2017-09-11 DIAGNOSIS — Z86718 Personal history of other venous thrombosis and embolism: Secondary | ICD-10-CM | POA: Diagnosis not present

## 2017-09-19 ENCOUNTER — Other Ambulatory Visit: Payer: Self-pay | Admitting: Family Medicine

## 2017-09-29 ENCOUNTER — Emergency Department (HOSPITAL_COMMUNITY): Payer: Medicare Other

## 2017-09-29 ENCOUNTER — Other Ambulatory Visit: Payer: Self-pay

## 2017-09-29 ENCOUNTER — Encounter (HOSPITAL_COMMUNITY): Payer: Self-pay

## 2017-09-29 ENCOUNTER — Inpatient Hospital Stay (HOSPITAL_COMMUNITY)
Admission: EM | Admit: 2017-09-29 | Discharge: 2017-10-03 | DRG: 812 | Disposition: A | Discharge status: Home / Self Care | Payer: Medicare Other | Attending: Internal Medicine | Admitting: Internal Medicine

## 2017-09-29 ENCOUNTER — Ambulatory Visit: Payer: Self-pay | Admitting: Family Medicine

## 2017-09-29 DIAGNOSIS — M549 Dorsalgia, unspecified: Secondary | ICD-10-CM | POA: Diagnosis not present

## 2017-09-29 DIAGNOSIS — D638 Anemia in other chronic diseases classified elsewhere: Secondary | ICD-10-CM | POA: Diagnosis present

## 2017-09-29 DIAGNOSIS — Z87891 Personal history of nicotine dependence: Secondary | ICD-10-CM

## 2017-09-29 DIAGNOSIS — R7989 Other specified abnormal findings of blood chemistry: Secondary | ICD-10-CM | POA: Diagnosis present

## 2017-09-29 DIAGNOSIS — Z86718 Personal history of other venous thrombosis and embolism: Secondary | ICD-10-CM | POA: Diagnosis not present

## 2017-09-29 DIAGNOSIS — Z91040 Latex allergy status: Secondary | ICD-10-CM

## 2017-09-29 DIAGNOSIS — Z9049 Acquired absence of other specified parts of digestive tract: Secondary | ICD-10-CM | POA: Diagnosis not present

## 2017-09-29 DIAGNOSIS — Z79899 Other long term (current) drug therapy: Secondary | ICD-10-CM | POA: Diagnosis not present

## 2017-09-29 DIAGNOSIS — G894 Chronic pain syndrome: Secondary | ICD-10-CM | POA: Diagnosis present

## 2017-09-29 DIAGNOSIS — M8780 Other osteonecrosis, unspecified bone: Secondary | ICD-10-CM | POA: Diagnosis not present

## 2017-09-29 DIAGNOSIS — F1410 Cocaine abuse, uncomplicated: Secondary | ICD-10-CM | POA: Diagnosis present

## 2017-09-29 DIAGNOSIS — D72829 Elevated white blood cell count, unspecified: Secondary | ICD-10-CM | POA: Diagnosis present

## 2017-09-29 DIAGNOSIS — M25571 Pain in right ankle and joints of right foot: Secondary | ICD-10-CM | POA: Diagnosis not present

## 2017-09-29 DIAGNOSIS — R079 Chest pain, unspecified: Secondary | ICD-10-CM

## 2017-09-29 DIAGNOSIS — Z7901 Long term (current) use of anticoagulants: Secondary | ICD-10-CM

## 2017-09-29 DIAGNOSIS — Z833 Family history of diabetes mellitus: Secondary | ICD-10-CM | POA: Diagnosis not present

## 2017-09-29 DIAGNOSIS — K219 Gastro-esophageal reflux disease without esophagitis: Secondary | ICD-10-CM | POA: Diagnosis present

## 2017-09-29 DIAGNOSIS — D473 Essential (hemorrhagic) thrombocythemia: Secondary | ICD-10-CM

## 2017-09-29 DIAGNOSIS — M545 Low back pain: Secondary | ICD-10-CM | POA: Diagnosis present

## 2017-09-29 DIAGNOSIS — E669 Obesity, unspecified: Secondary | ICD-10-CM | POA: Diagnosis present

## 2017-09-29 DIAGNOSIS — D5700 Hb-SS disease with crisis, unspecified: Principal | ICD-10-CM | POA: Diagnosis present

## 2017-09-29 DIAGNOSIS — M879 Osteonecrosis, unspecified: Secondary | ICD-10-CM | POA: Diagnosis not present

## 2017-09-29 DIAGNOSIS — R0789 Other chest pain: Secondary | ICD-10-CM | POA: Diagnosis present

## 2017-09-29 DIAGNOSIS — Z79891 Long term (current) use of opiate analgesic: Secondary | ICD-10-CM

## 2017-09-29 DIAGNOSIS — Z832 Family history of diseases of the blood and blood-forming organs and certain disorders involving the immune mechanism: Secondary | ICD-10-CM

## 2017-09-29 DIAGNOSIS — Z6835 Body mass index (BMI) 35.0-35.9, adult: Secondary | ICD-10-CM

## 2017-09-29 DIAGNOSIS — Z8249 Family history of ischemic heart disease and other diseases of the circulatory system: Secondary | ICD-10-CM

## 2017-09-29 DIAGNOSIS — D75839 Thrombocytosis, unspecified: Secondary | ICD-10-CM | POA: Diagnosis present

## 2017-09-29 DIAGNOSIS — M8788 Other osteonecrosis, other site: Secondary | ICD-10-CM | POA: Diagnosis present

## 2017-09-29 DIAGNOSIS — Z91018 Allergy to other foods: Secondary | ICD-10-CM | POA: Diagnosis not present

## 2017-09-29 DIAGNOSIS — M8700 Idiopathic aseptic necrosis of unspecified bone: Secondary | ICD-10-CM | POA: Diagnosis not present

## 2017-09-29 DIAGNOSIS — M546 Pain in thoracic spine: Secondary | ICD-10-CM | POA: Diagnosis not present

## 2017-09-29 LAB — COMPREHENSIVE METABOLIC PANEL
ALK PHOS: 83 U/L (ref 38–126)
ALT: 19 U/L (ref 14–54)
ANION GAP: 10 (ref 5–15)
AST: 20 U/L (ref 15–41)
Albumin: 4.5 g/dL (ref 3.5–5.0)
BILIRUBIN TOTAL: 2.7 mg/dL — AB (ref 0.3–1.2)
BUN: 5 mg/dL — ABNORMAL LOW (ref 6–20)
CALCIUM: 9.5 mg/dL (ref 8.9–10.3)
CO2: 22 mmol/L (ref 22–32)
CREATININE: 0.64 mg/dL (ref 0.44–1.00)
Chloride: 107 mmol/L (ref 101–111)
GFR calc non Af Amer: >60 mL/min (ref >60)
Glucose, Bld: 98 mg/dL (ref 65–99)
Potassium: 3.5 mmol/L (ref 3.5–5.1)
Sodium: 139 mmol/L (ref 135–145)
TOTAL PROTEIN: 8.1 g/dL (ref 6.5–8.1)

## 2017-09-29 LAB — RETICULOCYTES
RBC.: 3 MIL/uL — AB (ref 3.87–5.11)
RETIC COUNT ABSOLUTE: 567 10*3/uL — AB (ref 19.0–186.0)
RETIC CT PCT: 18.9 % — AB (ref 0.4–3.1)

## 2017-09-29 LAB — CBC WITH DIFFERENTIAL/PLATELET
BASOS ABS: 0 10*3/uL (ref 0.0–0.1)
BASOS PCT: 0 %
EOS ABS: 0.1 10*3/uL (ref 0.0–0.7)
Eosinophils Relative: 0 %
HEMATOCRIT: 27.8 % — AB (ref 36.0–46.0)
HEMOGLOBIN: 9.3 g/dL — AB (ref 12.0–15.0)
Lymphocytes Relative: 12 %
Lymphs Abs: 1.4 10*3/uL (ref 0.7–4.0)
MCH: 31 pg (ref 26.0–34.0)
MCHC: 33.5 g/dL (ref 30.0–36.0)
MCV: 92.7 fL (ref 78.0–100.0)
MONO ABS: 1 10*3/uL (ref 0.1–1.0)
Monocytes Relative: 8 %
NEUTROS ABS: 9.3 10*3/uL — AB (ref 1.7–7.7)
NEUTROS PCT: 80 %
Platelets: 600 10*3/uL — ABNORMAL HIGH (ref 150–400)
RBC: 3 MIL/uL — ABNORMAL LOW (ref 3.87–5.11)
RDW: 18.7 % — AB (ref 11.5–15.5)
WBC: 11.7 10*3/uL — ABNORMAL HIGH (ref 4.0–10.5)

## 2017-09-29 LAB — I-STAT TROPONIN, ED: TROPONIN I, POC: 0 ng/mL (ref 0.00–0.08)

## 2017-09-29 LAB — I-STAT BETA HCG BLOOD, ED (MC, WL, AP ONLY): I-stat hCG, quantitative: 8.1 m[IU]/mL — ABNORMAL HIGH (ref <5)

## 2017-09-29 LAB — POC URINE PREG, ED: Preg Test, Ur: NEGATIVE

## 2017-09-29 MED ORDER — SODIUM CHLORIDE 0.45 % IV SOLN
INTRAVENOUS | Status: DC
  Administered 2017-09-29: 20:00:00 via INTRAVENOUS

## 2017-09-29 MED ORDER — HYDROMORPHONE HCL 2 MG/ML IJ SOLN: 2.0000 mg | INTRAMUSCULAR | Status: AC

## 2017-09-29 MED ORDER — DIPHENHYDRAMINE HCL 50 MG/ML IJ SOLN
INTRAMUSCULAR | Status: AC
  Filled 2017-09-29: qty 1

## 2017-09-29 MED ORDER — HYDROMORPHONE HCL 2 MG/ML IJ SOLN
2.0000 mg | INTRAMUSCULAR | Status: AC
  Administered 2017-09-29: 2 mg via INTRAVENOUS
  Filled 2017-09-29: qty 1

## 2017-09-29 MED ORDER — DIPHENHYDRAMINE HCL 50 MG/ML IJ SOLN: 12.5000 mg | Freq: Once | INTRAMUSCULAR | Status: DC

## 2017-09-29 MED ORDER — DIPHENHYDRAMINE HCL 50 MG/ML IJ SOLN
12.5000 mg | Freq: Once | INTRAMUSCULAR | Status: AC
  Administered 2017-09-29: 12.5 mg via INTRAVENOUS

## 2017-09-29 MED ORDER — KETOROLAC TROMETHAMINE 30 MG/ML IJ SOLN
30.0000 mg | INTRAMUSCULAR | Status: AC
  Administered 2017-09-29: 30 mg via INTRAVENOUS
  Filled 2017-09-29: qty 1

## 2017-09-29 MED ORDER — DIPHENHYDRAMINE HCL 50 MG/ML IJ SOLN
25.0000 mg | Freq: Once | INTRAMUSCULAR | Status: AC
  Administered 2017-09-29: 25 mg via INTRAVENOUS
  Filled 2017-09-29: qty 1

## 2017-09-29 MED ORDER — ONDANSETRON HCL 4 MG/2ML IJ SOLN
4.0000 mg | INTRAMUSCULAR | Status: DC | PRN
  Administered 2017-09-29: 4 mg via INTRAVENOUS
  Filled 2017-09-29: qty 2

## 2017-09-29 NOTE — ED Triage Notes (Signed)
Patient c/o sickle cell pain of the mid chest, lower back , and right ankle since yesterday.patient states "a little bit of SOB."

## 2017-09-29 NOTE — ED Notes (Signed)
Pt asked this tech for something for pain, pt was told she may get something when she is triaged. Pt then laid in the floor in the lobby wrapped up in her blanket. Pt refuses to move, pt is laying on her stomach. This tech and sheila RN assessed pts vital signs which was 154/80 P104 R16 O299% on rm air.. Pt refuses to move or speak. Pt told her vital signs are ok and we will be back to check on her.

## 2017-09-29 NOTE — ED Provider Notes (Signed)
[place] DEPT Provider Note   CSN: [id number] Arrival date & time: 09/29/17  1521     History   Chief Complaint Chief Complaint  Patient presents with  . Sickle Cell Pain Crisis  . Chest Pain    HPI [name] is a 27 y.o. female past medical history of sickle cell anemia, chronic back pain, GERD who presents for evaluation of mid back pain, lower back pain, chest pain and right ankle pain that began yesterday.  Patient reports that she has had intermittent pain in the right foot for the last 4 to 5 days.  She denies any preceding trauma, injury, fall.  Patient states she is still been able to ambulate on the foot but does report worsening pain with ambulation.  She states that she has noticed some mild swelling but denies any overlying warmth or erythema.  Patient also presents for evaluation of back pain and chest pain.  Patient reports that this pain is typical of her sickle cell crisis.  Patient states her pain is worse with palpation or with movement.  She denies any associated shortness of breath, nausea, vomiting, diaphoresis.  She states she took her at home pain medications.  Her last dose was this morning.  Patient states she is followed by the sickle cell clinic and tried to see them today but states that they told her that they would be closing soon and will need to the ED for further evaluation.  Patient denies any fevers, difficulty breathing, abdominal pain, nausea/vomiting, numbness/weakness of her upper and lower extremities.  The history is provided by the patient.    Past Medical History:  Diagnosis Date  . Blood transfusion    "lots"  . Blood transfusion without reported diagnosis   . Chronic back pain    "very severe; have knot in my back; from tight muscle; take RX and exercise for it"  . Depression 01/06/2011  . Exertional dyspnea    "sometimes"  . Genital HSV   . GERD (gastroesophageal reflux disease) 02/17/2011  .  Migraines 11/08/11   "@ least twice/month"  . 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  . Mood swings 11/08/11   "I go back and forth; real bad"  . Sickle cell anemia ([name])   . Sickle cell anemia with crisis ([place])   . Thrombocytosis ([place]) 11/09/2015  . Trichotillomania    h/o    Patient Active Problem List   Diagnosis Date Noted  . 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 Chest pain 09/29/2017  . 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 Chronic anticoagulation   . Sickle cell anemia ([place]) 05/19/2016  . History of DVT (deep vein thrombosis) 04/17/2016  . Thrombocytosis ([place]) 11/09/2015  . Hyperbilirubinemia 11/09/2015  . Chronic pain 08/04/2015  . Herpes simplex 07/14/2015  . Anemia of chronic disease   . 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 CHOLECYSTECTOMY  05/2010  . DILATION AND CURETTAGE OF UTERUS  02/20/11   S/P miscarriage  . IR GENERIC HISTORICAL  12/23/2015   IR FLUORO GUIDE CV LINE RIGHT 12/23/2015 Jacqulynn Cadet, MD [facility]  . IR GENERIC HISTORICAL  12/23/2015   IR US GUIDE VASC ACCESS RIGHT 12/23/2015 Jacqulynn Cadet, MD [facility]     OB History    Gravida  5   Para  2  Term  2   Preterm  0   AB  3   Living  2     SAB  3   TAB  0   Ectopic  0   Multiple  0   Live Births  2        Obstetric Comments  Miscarried in October 2012 at about 7 weeks         Home Medications    Prior to Admission medications   Medication Sig Start Date End Date Taking? Authorizing Provider  Oxycodone HCl 20 MG TABS Take 1 tablet (20 mg total) by mouth every 4 (four) hours as needed (pain). 07/29/17  Yes Vanessa Kick, MD  aspirin-acetaminophen-caffeine (EXCEDRIN MIGRAINE) [phone] MG tablet Take 2 tablets every 6 (six) hours as needed by mouth for headache.    [provider]    cyclobenzaprine (FLEXERIL) 10 MG tablet Take 1 tablet (10 mg total) by mouth 3 (three) times daily as needed for muscle spasms. Patient not taking: Reported on 09/29/2017 07/24/17   Scot Jun, FNP  gabapentin (NEURONTIN) 300 MG capsule Take 1 capsule (300 mg total) by mouth 3 (three) times daily. Patient not taking: Reported on 09/29/2017 07/24/17   Scot Jun, FNP  hydroxyurea (HYDREA) 500 MG capsule Take 2 tabs (1000 mg ) PO QOD starting on 06/02/2017 alternating with 1 tab (500 mg) PO QOD starting on 06/03/2017. May take with food to minimize GI side effects. Patient taking differently: Take 1,000 mg by mouth daily. May take with food to minimize GI side effects. 07/24/17   Scot Jun, FNP  medroxyPROGESTERone (DEPO-PROVERA) 150 MG/ML injection Inject 1 mL (150 mg total) into the muscle every 3 (three) months. Patient not taking: Reported on 08/03/2017 09/21/16   Shelly Bombard, MD  morphine (MS CONTIN) 30 MG 12 hr tablet Take 1 tablet (30 mg total) by mouth every 12 (twelve) hours. Patient not taking: Reported on 09/29/2017 07/29/17   Vanessa Kick, MD  XARELTO 20 MG TABS tablet TAKE 1 TABLET (20 MG TOTAL) BY MOUTH DAILY WITH SUPPER. Patient not taking: Reported on 09/29/2017 09/20/17   Tresa Garter, MD    Family History Family History  Problem Relation Age of Onset  . Sickle cell trait Mother   . Sickle cell trait Father   . Diabetes Maternal Grandmother   . Diabetes Paternal Grandmother   . Hypertension Paternal Grandmother   . Diabetes Maternal Grandfather     Social History Social History   Tobacco Use  . Smoking status: Former Smoker    Packs/day: 0.25    Years: 1.00    Pack years: 0.25    Types: Cigarettes    Last attempt to quit: 03/25/2013    Years since quitting: 4.5  . Smokeless tobacco: Never Used  Substance Use Topics  . Alcohol use: No    Comment: denies  . Drug use: No    Types: Other-see comments    Comment: "Clean for 3 months"      Allergies   Carrot [daucus carota] and Latex   Review of Systems Review of Systems  Constitutional: Negative for chills and fever.  HENT: Negative for congestion.   Respiratory: Negative for cough and shortness of breath.   Cardiovascular: Positive for chest pain.  Gastrointestinal: Negative for abdominal pain, diarrhea, nausea and vomiting.  Musculoskeletal: Positive for back pain. Negative for neck pain.       Right ankle pain  Skin: Negative for rash.  Neurological:  Negative for weakness, numbness and headaches.  All other systems reviewed and are negative.    Physical Exam Updated Vital Signs BP 105/65   Pulse (!) 59   Temp 99.1 F (37.3 C) (Oral)   Resp 18   Ht 5\' 1"  (1.549 m)   Wt 86.2 kg (190 lb)   LMP 09/12/2017 (Approximate)   SpO2 96%   BMI 35.90 kg/m   Physical Exam  Constitutional: She is oriented to person, place, and time. She appears well-developed and well-nourished.  HENT:  Head: Normocephalic and atraumatic.  Mouth/Throat: Oropharynx is clear and moist and mucous membranes are normal.  Eyes: Pupils are equal, round, and reactive to light. Conjunctivae, EOM and lids are normal.  Neck: Full passive range of motion without pain.  Full flexion/extension and lateral movement of neck fully intact. No bony midline tenderness. No deformities or crepitus.   Cardiovascular: Normal rate, regular rhythm, normal heart sounds and normal pulses. Exam reveals no gallop and no friction rub.  No murmur heard. Pulses:      Radial pulses are 2+ on the right side, and 2+ on the left side.       Dorsalis pedis pulses are 2+ on the right side, and 2+ on the left side.  Pulmonary/Chest: Effort normal and breath sounds normal.  Lungs clear to auscultation bilaterally.  Symmetric chest rise.  No wheezing, rales, rhonchi.  Tenderness palpation noted to the midsternal.  No deformity or crepitus noted.  Pain is reproduced with palpation of the anterior chest wall.  No  reproducible pain with movement of the upper extremities.  Abdominal: Soft. Normal appearance. There is no tenderness. There is no rigidity and no guarding.  Musculoskeletal: Normal range of motion.  Tenderness palpation noted to the medial malleolus of the right ankle.  There is some mild soft tissue swelling but no overlying warmth, erythema.  Dorsiflexion and plantarflexion intact.  She is able to move all 5 digits of foot without any difficulty.  No tenderness palpation noted to calf.  No overlying warmth, erythema.  No abnormalities of the left lower extremity.  Neurological: She is alert and oriented to person, place, and time.  Follows commands, Moves all extremities  5/5 strength to BUE and BLE  Sensation intact throughout all major nerve distributions  Skin: Skin is warm and dry. Capillary refill takes less than 2 seconds.  Good distal cap refill. RLE is not dusky in appearance or cool to touch.  Psychiatric: She has a normal mood and affect. Her speech is normal.  Nursing note and vitals reviewed.    ED Treatments / Results  Labs (all labs ordered are listed, but only abnormal results are displayed) Labs Reviewed  COMPREHENSIVE METABOLIC PANEL - Abnormal; Notable for the following components:      Result Value   BUN 5 (*)    Total Bilirubin 2.7 (*)    All other components within normal limits  CBC WITH DIFFERENTIAL/PLATELET - Abnormal; Notable for the following components:   WBC 11.7 (*)    RBC 3.00 (*)    Hemoglobin 9.3 (*)    HCT 27.8 (*)    RDW 18.7 (*)    Platelets 600 (*)    Neutro Abs 9.3 (*)    All other components within normal limits  RETICULOCYTES - Abnormal; Notable for the following components:   Retic Ct Pct 18.9 (*)    RBC. 3.00 (*)    Retic Count, Absolute 567.0 (*)    All other components  within normal limits  I-STAT BETA HCG BLOOD, ED ([facility], [facility], [facility] ONLY) - Abnormal; Notable for the following components:   I-stat hCG, quantitative 8.1 (*)    All other  components within normal limits  I-STAT TROPONIN, ED  POC URINE PREG, ED  POC URINE PREG, ED    EKG EKG Interpretation  Date/Time:  Friday Sep 29 2017 17:34:59 EDT Ventricular Rate:  98 PR Interval:    QRS Duration: 68 QT Interval:  361 QTC Calculation: 461 R Axis:   80 Text Interpretation:  Sinus rhythm Probable left atrial enlargement Low voltage, precordial leads Borderline T abnormalities, anterior leads Baseline wander in lead(s) V5 No significant change since last tracing Confirmed by Deno Etienne [phone]) on 09/29/2017 11:22:21 PM   Radiology Dg Chest 2 View  Result Date: 09/29/2017 CLINICAL DATA:  Chest pain.  History of sickle cell disease. EXAM: CHEST - 2 VIEW COMPARISON:  Chest x-ray dated July 10, 2017. FINDINGS: Unchanged right chest wall port catheter. The heart size and mediastinal contours are within normal limits. Normal pulmonary vascularity. No focal consolidation, pleural effusion, or pneumothorax. Unchanged sclerosis in the bilateral humeral heads. No acute osseous abnormality. IMPRESSION: 1.  No active cardiopulmonary disease. 2. Unchanged bilateral humeral head avascular necrosis. Electronically Signed   By: Titus Dubin M.D.   On: 09/29/2017 18:15   Dg Thoracic Spine 2 View  Result Date: 09/29/2017 CLINICAL DATA:  Sickle cell pain upper, lower back and right ankle pain - all without injury x 1 day. EXAM: THORACIC SPINE 2 VIEWS COMPARISON:  None. FINDINGS: No acute appearing osseous abnormality. There is no evidence of thoracic spine fracture. Alignment is normal. Majority of the vertebral bodies within the mid and lower thoracic spine have an H-shaped configuration compatible with sequela of sickle cell disease. Visualized paravertebral soft tissues are unremarkable. Cholecystectomy clips in the RIGHT upper quadrant. RIGHT chest wall Port-A-Cath in place with tip at the level of the mid/lower SVC. IMPRESSION: 1. No acute findings. 2. Configuration of the thoracic  vertebral bodies within the mid and lower thoracic spine are compatible with sequela of sickle cell disease. Electronically Signed   By: Franki Cabot M.D.   On: 09/29/2017 21:08   Dg Lumbar Spine Complete  Result Date: 09/29/2017 CLINICAL DATA:  Sickle cell pain upper, lower back and right ankle pain - all without injury x 1 day EXAM: LUMBAR SPINE - COMPLETE 4+ VIEW COMPARISON:  None. FINDINGS: No acute appearing osseous abnormality. There is no evidence of lumbar spine fracture. Alignment is normal. As in the thoracic spine, configuration of the majority of the lumbar vertebral bodies is H-shaped consistent with sequela of sickle cell. Intervertebral disc spaces are maintained. Visualized paravertebral soft tissues are unremarkable. Cholecystectomy clips in the RIGHT upper quadrant. IMPRESSION: No acute findings. Majority of the lumbar vertebral bodies are H-shaped, consistent with sequela of sickle cell. Electronically Signed   By: Franki Cabot M.D.   On: 09/29/2017 22:01   Dg Ankle Complete Right  Result Date: 09/29/2017 CLINICAL DATA:  Sickle cell pain, no injury. EXAM: RIGHT ANKLE - COMPLETE 3+ VIEW COMPARISON:  RIGHT ankle radiograph July 16, 2013 FINDINGS: No fracture deformity nor dislocation. Faint sclerosis calcaneus and distal tibia. The ankle mortise appears congruent and the tibiofibular syndesmosis intact. No destructive bony lesions. Soft tissue planes are non-suspicious. IMPRESSION: Patchy sclerosis suggesting bone infarcts. No acute osseous process. Electronically Signed   By: Elon Alas M.D.   On: 09/29/2017 21:06    Procedures Procedures (  including critical care time)  Medications Ordered in ED Medications  0.45 % sodium chloride infusion ( Intravenous New Bag/Given 09/29/17 1941)  ondansetron (ZOFRAN) injection 4 mg (4 mg Intravenous Given 09/29/17 1941)  diphenhydrAMINE (BENADRYL) injection 12.5 mg (12.5 mg Intravenous Not Given 09/29/17 2207)  ketorolac (TORADOL) 30  MG/ML injection 30 mg (30 mg Intravenous Given 09/29/17 1941)  HYDROmorphone (DILAUDID) injection 2 mg (2 mg Intravenous Given 09/29/17 1942)    Or  HYDROmorphone (DILAUDID) injection 2 mg ( Subcutaneous See Alternative 09/29/17 1942)  HYDROmorphone (DILAUDID) injection 2 mg (2 mg Intravenous Given 09/29/17 2056)    Or  HYDROmorphone (DILAUDID) injection 2 mg ( Subcutaneous See Alternative 09/29/17 2056)  HYDROmorphone (DILAUDID) injection 2 mg (2 mg Intravenous Given 09/29/17 2202)    Or  HYDROmorphone (DILAUDID) injection 2 mg ( Subcutaneous See Alternative 09/29/17 2202)  diphenhydrAMINE (BENADRYL) injection 25 mg (25 mg Intravenous Given 09/29/17 1942)  diphenhydrAMINE (BENADRYL) injection 12.5 mg (12.5 mg Intravenous Given 09/29/17 2207)     Initial Impression / Assessment and Plan / ED Course  I have reviewed the triage vital signs and the nursing notes.  Pertinent labs & imaging results that were available during my care of the patient were reviewed by me and considered in my medical decision making (see chart for details).     27 year old female with past medical history of sickle cell anemia, chronic pain who presents for evaluation of chest, back pain that is been ongoing since yesterday.  Also presents for evaluation of right ankle pain.  No preceding trauma, injury, fall.  Patient states that her pain feels similar to her previous episodes of sickle cell crisis.  Reports she is on home medications that she has been taking that with no improvement.  No shortness of breath, fevers, nausea, vomiting. Patient is afebrile, non-toxic appearing, sitting comfortably on examination table. Vital signs reviewed and stable.  On exam, patient's right lower extremity is without any warmth, erythema.  Intact distal pulses and good cap refill.  Exam is not concerning for DVT, septic arthritis, acute arterial embolism.  Will plan for x-ray evaluation.  Consider muscular skeletal pain versus sickle cell pain  crisis.  History/physical exam is not concerning for PE.  We will plan to check basic labs, chest x-ray, imaging of foot and back.  Analgesics provided in the department.  Diabetes slightly positive.  Likely false positive.  Will get urine pregnancy to confirm.  Troponin negative.  CBC shows slight leukocytosis of 11.7.  Hemoglobin is stable at 9.3 and hematocrit is 27.8.  Platelets are 600.  Since had previously had platelets in the 600s 2 months ago.  Triglycerides are 18.9.  CMP is unremarkable.  Lumbar spine negative for any acute abnormality.  T-spine x-ray negative for any acute bony abnormality.  Ankle x-ray shows patchy sclerosis suggesting bone infarcts.  No other acute osseous process.  Chest x-ray negative for any acute bony abnormality.  There is mention bilateral humeral head avascular necrosis which has been mentioned before.  Discussed with patient.  She reports some improvement in her chest and back pain after analgesics.  Patient reports she is still having foot pain.  Will give additional rounds of analgesics.  Discussed results with patient.  She reports that she has had a bone infarct in her hip and everyone in her foot.  Patient reports after 3 rounds of pain medication, her pain is still not controlled.  Given new acute bone infarct finding and ability to control patient's  pain, feel that admission for pain control and further evaluation is best.  Discussed with Dr. Ninfa Linden (Ortho).  No indication for MRI or IV antibiotic's at this time.  Recommends pain control, supportive measures.  When patient is discharged, recommends postop shoe with anti-inflammatories.  Gust with hospitalist.  Will admit.  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

## 2017-09-29 NOTE — ED Notes (Signed)
Patient refused lab draw @ this time. 

## 2017-09-29 NOTE — H&P (Signed)
[name] WFU:[id number] DOB: 08-16-1990 DOA: 09/29/2017   PCP: Scot Jun, FNP  Used to see Dr. Benita Stabile Outpatient Specialists:     Patient arrived to ER on 09/29/17 at 1521  Patient coming from:    home Lives alone,       Chief Complaint:  Chief Complaint  Patient presents with  . Sickle Cell Pain Crisis  . Chest Pain    HPI: [name] is a 27 y.o. female with medical history significant of sickle cell disease, avascular necrosis of the hips    Presented with  Chest pain since this Am described as typical worse with cough also back pain similar to sickle cell pain crisis in the past started since yesterday, also associated right ankle pain for past 4 to 5 days never had this prior no trauma or injury.  Able to bear weight no erythema has mild swelling no fever, no shortness of breath went to sickle cell clinic today but they were already closed.  Her MsContin was not refilled  Due to PCP loosing Liscence (was prescribed by Dr. Benita Stabile) and she was supposed to be on Oxycodone 10 mg q4h but has been taking 20 mg q4. States she only has 5 tablets left at home.   Last sickle cell pain crisis was end of March and was treated at day clinic Has used her home indications last dose was this morning but did not seem to help.    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    While in ER:  Patient noted to lay down on the floor and refused care initially Was given 3 doses of Dilaudid pain continues Following Medications were ordered in ER: Medications  0.45 % sodium chloride infusion ( Intravenous New Bag/Given 09/29/17 1941)  ondansetron (ZOFRAN) injection 4 mg (4 mg Intravenous Given 09/29/17 1941)  diphenhydrAMINE (BENADRYL) injection 12.5 mg (12.5 mg Intravenous Not Given 09/29/17 2207)  ketorolac (TORADOL) 30 MG/ML injection 30 mg (30 mg Intravenous Given 09/29/17 1941)  HYDROmorphone (DILAUDID) injection 2 mg (2 mg Intravenous Given  09/29/17 1942)    Or  HYDROmorphone (DILAUDID) injection 2 mg ( Subcutaneous See Alternative 09/29/17 1942)  HYDROmorphone (DILAUDID) injection 2 mg (2 mg Intravenous Given 09/29/17 2056)    Or  HYDROmorphone (DILAUDID) injection 2 mg ( Subcutaneous See Alternative 09/29/17 2056)  HYDROmorphone (DILAUDID) injection 2 mg (2 mg Intravenous Given 09/29/17 2202)    Or  HYDROmorphone (DILAUDID) injection 2 mg ( Subcutaneous See Alternative 09/29/17 2202)  diphenhydrAMINE (BENADRYL) injection 25 mg (25 mg Intravenous Given 09/29/17 1942)  diphenhydrAMINE (BENADRYL) injection 12.5 mg (12.5 mg Intravenous Given 09/29/17 2207)    Significant initial  Findings: Abnormal Labs Reviewed  COMPREHENSIVE METABOLIC PANEL - Abnormal; Notable for the following components:      Result Value   BUN 5 (*)    Total Bilirubin 2.7 (*)    All other components within normal limits  CBC WITH DIFFERENTIAL/PLATELET - Abnormal; Notable for the following components:   WBC 11.7 (*)    RBC 3.00 (*)    Hemoglobin 9.3 (*)    HCT 27.8 (*)    RDW 18.7 (*)    Platelets 600 (*)    Neutro Abs 9.3 (*)    All other components within normal limits  RETICULOCYTES - Abnormal; Notable for the following components:   Retic Ct Pct 18.9 (*)    RBC. 3.00 (*)    Retic Count,  Absolute 567.0 (*)    All other components within normal limits  I-STAT BETA HCG BLOOD, ED ([facility], [facility], [facility] ONLY) - Abnormal; Notable for the following components:   I-stat hCG, quantitative 8.1 (*)    All other components within normal limits     Na 139 K 3.5  Cr   stable,   Lab Results  Component Value Date   CREATININE 0.64 09/29/2017   CREATININE 0.59 08/03/2017   CREATININE 0.64 08/02/2017        HG/HCT   stable,       Component Value Date/Time   HGB 9.3 (L) 09/29/2017 1949   HGB 9.3 (L) 07/24/2017 1421   HCT 27.8 (L) 09/29/2017 1949   HCT 28.9 (L) 07/24/2017 1421       Troponin (Point of Care Test) Recent Labs    09/29/17 1955  TROPIPOC  0.00    Lactic Acid, Venous    Component Value Date/Time   LATICACIDVEN 1.4 11/09/2015 0506      UA  not ordered  Ankle right patchy sclerosis suggesting bone infarcts Thoracic spine - nonacute    ECG:  Personally reviewed by me showing: HR : 98 Rhythm:  NSR   nonspecific changes, T wave flattening  QTC 461     ED Triage Vitals  Enc Vitals Group     BP 09/29/17 1631 (!) 127/97     Pulse Rate 09/29/17 1631 90     Resp 09/29/17 1631 16     Temp 09/29/17 1631 99.1 F (37.3 C)     Temp Source 09/29/17 1631 Oral     SpO2 09/29/17 1631 100 %     Weight 09/29/17 1742 190 lb (86.2 kg)     Height 09/29/17 1742 5\' 1"  (1.549 m)     Head Circumference --      Peak Flow --      Pain Score 09/29/17 1741 8     Pain Loc --      Pain Edu? --      Excl. in [place]? --   TMAX(24)@       Latest  Blood pressure 105/65, pulse (!) 59, temperature 99.1 F (37.3 C), temperature source Oral, resp. rate 18, height 5\' 1"  (1.549 m), weight 86.2 kg (190 lb), last menstrual period 09/12/2017, SpO2 96 %, not currently breastfeeding.    ER Provider Called: orthopedics    Dr. Ninfa Linden They Recommend conservative management, and wear postop shoe if comfortable at the time of discharge    Hospitalist was called for admission for cell pain crisis and bony infarct   Review of Systems:    Pertinent positives include: chest pain, right ankle pain back pain.  Constitutional:  No weight loss, night sweats, Fevers, chills, fatigue, weight loss  HEENT:  No headaches, Difficulty swallowing,Tooth/dental problems,Sore throat,  No sneezing, itching, ear ache, nasal congestion, post nasal drip,  Cardio-vascular:  No Orthopnea, PND, anasarca, dizziness, palpitations.no Bilateral lower extremity swelling  GI:  No heartburn, indigestion, abdominal pain, nausea, vomiting, diarrhea, change in bowel habits, loss of appetite, melena, blood in stool, hematemesis Resp:  no shortness of breath at rest. No  dyspnea on exertion, No excess mucus, no productive cough, No non-productive cough, No coughing up of blood.No change in color of mucus.No wheezing. Skin:  no rash or lesions. No jaundice GU:  no dysuria, change in color of urine, no urgency or frequency. No straining to urinate.  No flank pain.  Musculoskeletal:  No joint pain or no  joint swelling. No decreased range of motion. No  Psych:  No change in mood or affect. No depression or anxiety. No memory loss.  Neuro: no localizing neurological complaints, no tingling, no weakness, no double vision, no gait abnormality, no slurred speech, no confusion  As per HPI otherwise 10 point review of systems negative.   Past Medical History:   Past Medical History:  Diagnosis Date  . Blood transfusion    "lots"  . Blood transfusion without reported diagnosis   . Chronic back pain    "very severe; have knot in my back; from tight muscle; take RX and exercise for it"  . Depression 01/06/2011  . Exertional dyspnea    "sometimes"  . Genital HSV   . GERD (gastroesophageal reflux disease) 02/17/2011  . Migraines 11/08/11   "@ least twice/month"  . 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  . Mood swings 11/08/11   "I go back and forth; real bad"  . Sickle cell anemia ([name])   . Sickle cell anemia with crisis ([place])   . Thrombocytosis ([place]) 11/09/2015  . Trichotillomania    h/o      Past Surgical History:  Procedure Laterality Date  . CHOLECYSTECTOMY  05/2010  . DILATION AND CURETTAGE OF UTERUS  02/20/11   S/P miscarriage  . IR GENERIC HISTORICAL  12/23/2015   IR FLUORO GUIDE CV LINE RIGHT 12/23/2015 Jacqulynn Cadet, MD [facility]  . IR GENERIC HISTORICAL  12/23/2015   IR US GUIDE VASC ACCESS RIGHT 12/23/2015 Jacqulynn Cadet, MD [facility]    Social History:  Ambulatory   independently      reports that she quit smoking about 4 years ago. Her smoking use included cigarettes. She has a 0.25 pack-year smoking history. She has  never used smokeless tobacco. She reports that she does not drink alcohol or use drugs.     Family History:   Family History  Problem Relation Age of Onset  . Sickle cell trait Mother   . Sickle cell trait Father   . Diabetes Maternal Grandmother   . Diabetes Paternal Grandmother   . Hypertension Paternal Grandmother   . Diabetes Maternal Grandfather     Allergies: Allergies  Allergen Reactions  . Carrot [Daucus Carota] Hives and Swelling    Allergic to carrots  . Latex Rash     Prior to Admission medications   Medication Sig Start Date End Date Taking? Authorizing Provider  Oxycodone HCl 20 MG TABS Take 1 tablet (20 mg total) by mouth every 4 (four) hours as needed (pain). 07/29/17  Yes Vanessa Kick, MD  aspirin-acetaminophen-caffeine (EXCEDRIN MIGRAINE) [phone] MG tablet Take 2 tablets every 6 (six) hours as needed by mouth for headache.    [provider]  cyclobenzaprine (FLEXERIL) 10 MG tablet Take 1 tablet (10 mg total) by mouth 3 (three) times daily as needed for muscle spasms. Patient not taking: Reported on 09/29/2017 07/24/17   Scot Jun, FNP  gabapentin (NEURONTIN) 300 MG capsule Take 1 capsule (300 mg total) by mouth 3 (three) times daily. Patient not taking: Reported on 09/29/2017 07/24/17   Scot Jun, FNP  hydroxyurea (HYDREA) 500 MG capsule Take 2 tabs (1000 mg ) PO QOD starting on 06/02/2017 alternating with 1 tab (500 mg) PO QOD starting on 06/03/2017. May take with food to minimize GI side effects. Patient taking differently: Take 1,000 mg by mouth daily. May take with food to minimize GI side effects. 07/24/17   Scot Jun,  FNP  medroxyPROGESTERone (DEPO-PROVERA) 150 MG/ML injection Inject 1 mL (150 mg total) into the muscle every 3 (three) months. Patient not taking: Reported on 08/03/2017 09/21/16   Shelly Bombard, MD  morphine (MS CONTIN) 30 MG 12 hr tablet Take 1 tablet (30 mg total) by mouth every 12 (twelve) hours. Patient  not taking: Reported on 09/29/2017 07/29/17   Vanessa Kick, MD  XARELTO 20 MG TABS tablet TAKE 1 TABLET (20 MG TOTAL) BY MOUTH DAILY WITH SUPPER. Patient not taking: Reported on 09/29/2017 09/20/17   Tresa Garter, MD   Physical Exam: Blood pressure 105/65, pulse (!) 59, temperature 99.1 F (37.3 C), temperature source Oral, resp. rate 18, height 5\' 1"  (1.549 m), weight 86.2 kg (190 lb), last menstrual period 09/12/2017, SpO2 96 %, not currently breastfeeding. 1. General:  in No Acute distress * Chronically ill *well *cachectic *toxic acutely ill -appearing 2. Psychological: Alert and   Oriented 3. Head/ENT:     Dry Mucous Membranes                          Head Non traumatic, neck supple                          Poor Dentition 4. SKIN:  decreased Skin turgor,  Skin clean Dry and intact no rash 5. Heart: Regular rate and rhythm no Murmur, no Rub or gallop 6. Lungs: Clear to auscultation bilaterally, no wheezes or crackles   7. Abdomen: Soft,  non-tender, Non distended   Obese bowel sounds present 8. Lower extremities: no clubbing, cyanosis, or edema 9. Neurologically Grossly intact, moving all 4 extremities equally   10. MSK: Normal range of motion except for right ankle   LABS:     Recent Labs  Lab 09/29/17 1949  WBC 11.7*  NEUTROABS 9.3*  HGB 9.3*  HCT 27.8*  MCV 92.7  PLT 782*   Basic Metabolic Panel: Recent Labs  Lab 09/29/17 1949  NA 139  K 3.5  CL 107  CO2 22  GLUCOSE 98  BUN 5*  CREATININE 0.64  CALCIUM 9.5      Recent Labs  Lab 09/29/17 1949  AST 20  ALT 19  ALKPHOS 83  BILITOT 2.7*  PROT 8.1  ALBUMIN 4.5   No results for input(s): LIPASE, AMYLASE in the last 168 hours. No results for input(s): AMMONIA in the last 168 hours.    HbA1C: No results for input(s): HGBA1C in the last 72 hours. CBG: No results for input(s): GLUCAP in the last 168 hours.    Urine analysis:    Component Value Date/Time   COLORURINE AMBER (A) 07/05/2017 1021     APPEARANCEUR CLEAR 07/05/2017 1021   APPEARANCEUR Clear 05/06/2012 1546   LABSPEC <=1.005 07/24/2017 1347   LABSPEC 1.011 05/06/2012 1546   PHURINE 5.5 07/24/2017 1347   GLUCOSEU NEGATIVE 07/24/2017 1347   GLUCOSEU Negative 05/06/2012 1546   HGBUR NEGATIVE 07/24/2017 1347   HGBUR negative 01/08/2009 1329   BILIRUBINUR NEGATIVE 07/24/2017 1347   BILIRUBINUR Negative 05/06/2012 1546   KETONESUR NEGATIVE 07/24/2017 1347   PROTEINUR NEGATIVE 07/24/2017 1347   UROBILINOGEN 0.2 07/24/2017 1347   NITRITE NEGATIVE 07/24/2017 1347   LEUKOCYTESUR TRACE (A) 07/24/2017 1347   LEUKOCYTESUR Negative 05/06/2012 1546       Cultures:    Component Value Date/Time   SDES  07/04/2017 1403    BLOOD LEFT HAND Performed at  [facility] [place] [address], [place], [place] [address]    SPECREQUEST  07/04/2017 1403    BOTTLES DRAWN AEROBIC ONLY Blood Culture adequate volume Performed at [facility] [place] [address], [place], [place] [address]    CULT  07/04/2017 1403    NO GROWTH 5 DAYS Performed at [facility] [address], [place],  [address]    REPTSTATUS 07/09/2017 FINAL 07/04/2017 1403     Radiological Exams on Admission: Dg Chest 2 View  Result Date: 09/29/2017 CLINICAL DATA:  Chest pain.  History of sickle cell disease. EXAM: CHEST - 2 VIEW COMPARISON:  Chest x-ray dated July 10, 2017. FINDINGS: Unchanged right chest wall port catheter. The heart size and mediastinal contours are within normal limits. Normal pulmonary vascularity. No focal consolidation, pleural effusion, or pneumothorax. Unchanged sclerosis in the bilateral humeral heads. No acute osseous abnormality. IMPRESSION: 1.  No active cardiopulmonary disease. 2. Unchanged bilateral humeral head avascular necrosis. Electronically Signed   By: Titus Dubin M.D.   On: 09/29/2017 18:15   Dg Thoracic Spine 2 View  Result Date: 09/29/2017 CLINICAL DATA:  Sickle cell pain  upper, lower back and right ankle pain - all without injury x 1 day. EXAM: THORACIC SPINE 2 VIEWS COMPARISON:  None. FINDINGS: No acute appearing osseous abnormality. There is no evidence of thoracic spine fracture. Alignment is normal. Majority of the vertebral bodies within the mid and lower thoracic spine have an H-shaped configuration compatible with sequela of sickle cell disease. Visualized paravertebral soft tissues are unremarkable. Cholecystectomy clips in the RIGHT upper quadrant. RIGHT chest wall Port-A-Cath in place with tip at the level of the mid/lower SVC. IMPRESSION: 1. No acute findings. 2. Configuration of the thoracic vertebral bodies within the mid and lower thoracic spine are compatible with sequela of sickle cell disease. Electronically Signed   By: Franki Cabot M.D.   On: 09/29/2017 21:08   Dg Lumbar Spine Complete  Result Date: 09/29/2017 CLINICAL DATA:  Sickle cell pain upper, lower back and right ankle pain - all without injury x 1 day EXAM: LUMBAR SPINE - COMPLETE 4+ VIEW COMPARISON:  None. FINDINGS: No acute appearing osseous abnormality. There is no evidence of lumbar spine fracture. Alignment is normal. As in the thoracic spine, configuration of the majority of the lumbar vertebral bodies is H-shaped consistent with sequela of sickle cell. Intervertebral disc spaces are maintained. Visualized paravertebral soft tissues are unremarkable. Cholecystectomy clips in the RIGHT upper quadrant. IMPRESSION: No acute findings. Majority of the lumbar vertebral bodies are H-shaped, consistent with sequela of sickle cell. Electronically Signed   By: Franki Cabot M.D.   On: 09/29/2017 22:01   Dg Ankle Complete Right  Result Date: 09/29/2017 CLINICAL DATA:  Sickle cell pain, no injury. EXAM: RIGHT ANKLE - COMPLETE 3+ VIEW COMPARISON:  RIGHT ankle radiograph July 16, 2013 FINDINGS: No fracture deformity nor dislocation. Faint sclerosis calcaneus and distal tibia. The ankle mortise appears  congruent and the tibiofibular syndesmosis intact. No destructive bony lesions. Soft tissue planes are non-suspicious. IMPRESSION: Patchy sclerosis suggesting bone infarcts. No acute osseous process. Electronically Signed   By: Elon Alas M.D.   On: 09/29/2017 21:06    Chart has been reviewed    Assessment/Plan   27 y.o. female with medical history significant of sickle cell disease, avascular necrosis of the hips Admitted for sickle cell pain crisis and bony necrosis of right ankle  Present on Admission: . Hb-SS disease with crisis (  [place]) - - will admit per sickle cell protocol,    control pain,    hydrate with IVF D5 .45% Saline @ 100 mls/hour,    Weight based Dilaudid PCA for opioid tolerant patients.    continue hydroxyurea and folic acid   Transfuse as needed if Hg drops significantly below baseline.    No evidence of acute chest at this time   Sickle cell team to take over management in AM  . Anemia of chronic disease - chronic currently stable Chest pain - atypical in the setting of sickle cell pain crisis similar to prior we will continue to monitor chest x-ray showed no evidence of acute chest . Thrombocytosis ([name]) -chronic currently stable . 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   Other plan as per orders.  DVT prophylaxis:  lovenox  Code Status:  FULL CODE   as per patient   I had personally discussed CODE STATUS with patient    Family Communication:   Family not  at  Bedside     Disposition Plan:   To home once workup is complete and patient is stable                              Consults called: none, Orthopedics discuss with ER on the phone    Admission status: inpatient     Level of care  tele             Toy Baker 09/30/2017, 12:08 AM    [facility]  Pager  [phone]   after 2 AM please page floor coverage PA If 7AM-7PM, please contact the day team taking care of the patient  [email or web address]  Password [email or web address]

## 2017-09-30 ENCOUNTER — Encounter (HOSPITAL_COMMUNITY): Payer: Self-pay | Admitting: Internal Medicine

## 2017-09-30 DIAGNOSIS — M879 Osteonecrosis, unspecified: Secondary | ICD-10-CM

## 2017-09-30 LAB — RAPID URINE DRUG SCREEN, HOSP PERFORMED
Amphetamines: NOT DETECTED
BARBITURATES: NOT DETECTED
BENZODIAZEPINES: NOT DETECTED
COCAINE: POSITIVE — AB
OPIATES: POSITIVE — AB
Tetrahydrocannabinol: NOT DETECTED

## 2017-09-30 LAB — PHOSPHORUS: Phosphorus: 5.3 mg/dL — ABNORMAL HIGH (ref 2.5–4.6)

## 2017-09-30 LAB — MAGNESIUM: MAGNESIUM: 2.5 mg/dL — AB (ref 1.7–2.4)

## 2017-09-30 MED ORDER — DEXTROSE-NACL 5-0.45 % IV SOLN
INTRAVENOUS | Status: AC
  Administered 2017-09-30 – 2017-10-02 (×7): via INTRAVENOUS

## 2017-09-30 MED ORDER — HYDROMORPHONE 1 MG/ML IV SOLN: INTRAVENOUS | Status: DC

## 2017-09-30 MED ORDER — HYDROMORPHONE 1 MG/ML IV SOLN
INTRAVENOUS | Status: DC
  Administered 2017-09-30: 2.2 mg via INTRAVENOUS
  Administered 2017-10-01: 13:00:00 via INTRAVENOUS
  Administered 2017-10-01: 12 mg via INTRAVENOUS
  Administered 2017-10-01: 1 mg via INTRAVENOUS
  Administered 2017-10-02: 7 mg via INTRAVENOUS
  Administered 2017-10-02: 4 mg via INTRAVENOUS
  Administered 2017-10-02: 1 mg via INTRAVENOUS
  Administered 2017-10-02: 8.5 mg via INTRAVENOUS
  Administered 2017-10-02: 4.5 mg via INTRAVENOUS
  Administered 2017-10-02: 6.5 mg via INTRAVENOUS
  Administered 2017-10-03: 6 mg via INTRAVENOUS
  Administered 2017-10-03: 5.5 mg via INTRAVENOUS
  Administered 2017-10-03: 01:00:00 via INTRAVENOUS
  Administered 2017-10-03: 1 mg via INTRAVENOUS
  Administered 2017-10-03: 0.5 mg via INTRAVENOUS
  Administered 2017-10-03: 11.5 mg via INTRAVENOUS
  Filled 2017-09-30 (×3): qty 25

## 2017-09-30 MED ORDER — MAGNESIUM CITRATE PO SOLN: 1.0000 | Freq: Once | ORAL | Status: DC | PRN

## 2017-09-30 MED ORDER — MORPHINE SULFATE ER 30 MG PO TBCR
30.0000 mg | EXTENDED_RELEASE_TABLET | Freq: Two times a day (BID) | ORAL | Status: DC
  Administered 2017-09-30 – 2017-10-03 (×7): 30 mg via ORAL
  Filled 2017-09-30 (×7): qty 1

## 2017-09-30 MED ORDER — SODIUM CHLORIDE 0.9% FLUSH
10.0000 mL | INTRAVENOUS | Status: DC | PRN
  Administered 2017-10-03: 40 mL
  Filled 2017-09-30: qty 40

## 2017-09-30 MED ORDER — ONDANSETRON HCL 4 MG/2ML IJ SOLN: 4.0000 mg | INTRAMUSCULAR | Status: DC | PRN

## 2017-09-30 MED ORDER — SODIUM CHLORIDE 0.9% FLUSH: 9.0000 mL | INTRAVENOUS | Status: DC | PRN

## 2017-09-30 MED ORDER — FOLIC ACID 1 MG PO TABS
1.0000 mg | ORAL_TABLET | Freq: Every day | ORAL | Status: DC
  Administered 2017-09-30 – 2017-10-03 (×4): 1 mg via ORAL
  Filled 2017-09-30 (×4): qty 1

## 2017-09-30 MED ORDER — ENOXAPARIN SODIUM 40 MG/0.4ML ~~LOC~~ SOLN
40.0000 mg | SUBCUTANEOUS | Status: DC
  Administered 2017-09-30 – 2017-10-03 (×5): 40 mg via SUBCUTANEOUS
  Filled 2017-09-30 (×5): qty 0.4

## 2017-09-30 MED ORDER — HYDROMORPHONE 1 MG/ML IV SOLN
INTRAVENOUS | Status: DC
  Administered 2017-09-30: 19:00:00 via INTRAVENOUS
  Administered 2017-09-30: 1.5 mg via INTRAVENOUS
  Administered 2017-09-30: 25 mg via INTRAVENOUS
  Filled 2017-09-30 (×2): qty 25

## 2017-09-30 MED ORDER — HYDROXYUREA 500 MG PO CAPS
1000.0000 mg | ORAL_CAPSULE | Freq: Every day | ORAL | Status: DC
  Administered 2017-09-30 – 2017-10-03 (×4): 1000 mg via ORAL
  Filled 2017-09-30 (×4): qty 2

## 2017-09-30 MED ORDER — POLYETHYLENE GLYCOL 3350 17 G PO PACK
17.0000 g | PACK | Freq: Every day | ORAL | Status: DC | PRN
  Administered 2017-10-01: 17 g via ORAL
  Filled 2017-09-30: qty 1

## 2017-09-30 MED ORDER — BISACODYL 10 MG RE SUPP: 10.0000 mg | Freq: Every day | RECTAL | Status: DC | PRN

## 2017-09-30 MED ORDER — NALOXONE HCL 0.4 MG/ML IJ SOLN: 0.4000 mg | INTRAMUSCULAR | Status: DC | PRN

## 2017-09-30 MED ORDER — GABAPENTIN 300 MG PO CAPS
300.0000 mg | ORAL_CAPSULE | Freq: Three times a day (TID) | ORAL | Status: DC
  Administered 2017-09-30 – 2017-10-03 (×11): 300 mg via ORAL
  Filled 2017-09-30 (×11): qty 1

## 2017-09-30 MED ORDER — DIPHENHYDRAMINE HCL 25 MG PO CAPS: 25.0000 mg | ORAL_CAPSULE | ORAL | Status: DC | PRN

## 2017-09-30 MED ORDER — ONDANSETRON HCL 4 MG PO TABS: 4.0000 mg | ORAL_TABLET | ORAL | Status: DC | PRN

## 2017-09-30 MED ORDER — SODIUM CHLORIDE 0.9 % IV SOLN
25.0000 mg | INTRAVENOUS | Status: DC | PRN
  Filled 2017-09-30: qty 0.5

## 2017-09-30 MED ORDER — KETOROLAC TROMETHAMINE 30 MG/ML IJ SOLN
30.0000 mg | Freq: Four times a day (QID) | INTRAMUSCULAR | Status: DC
  Administered 2017-09-30 – 2017-10-03 (×16): 30 mg via INTRAVENOUS
  Filled 2017-09-30 (×16): qty 1

## 2017-09-30 MED ORDER — HYDROXYZINE HCL 25 MG PO TABS: 25.0000 mg | ORAL_TABLET | ORAL | Status: DC | PRN

## 2017-09-30 MED ORDER — HYDROMORPHONE 1 MG/ML IV SOLN
INTRAVENOUS | Status: DC
  Administered 2017-09-30: 8 mg via INTRAVENOUS

## 2017-09-30 MED ORDER — CYCLOBENZAPRINE HCL 10 MG PO TABS
10.0000 mg | ORAL_TABLET | Freq: Three times a day (TID) | ORAL | Status: DC | PRN
  Administered 2017-10-01: 10 mg via ORAL
  Filled 2017-09-30 (×2): qty 1

## 2017-09-30 MED ORDER — ONDANSETRON HCL 4 MG/2ML IJ SOLN: 4.0000 mg | Freq: Four times a day (QID) | INTRAMUSCULAR | Status: DC | PRN

## 2017-09-30 MED ORDER — SENNOSIDES-DOCUSATE SODIUM 8.6-50 MG PO TABS
1.0000 | ORAL_TABLET | Freq: Two times a day (BID) | ORAL | Status: DC
Start: 2017-09-30 — End: 2017-10-03
  Administered 2017-09-30 – 2017-10-03 (×8): 1 via ORAL
  Filled 2017-09-30 (×8): qty 1

## 2017-09-30 NOTE — ED Notes (Signed)
ED TO INPATIENT HANDOFF REPORT  Name/Age/Gender [name] 26 y.o. female  Code Status Code Status History    Date Active Date Inactive Code Status Order ID Comments User Context   07/10/2017 1947 07/13/2017 1916 Full Code [id number]  Garba, Mohammad L, MD ED   07/02/2017 0848 07/07/2017 2044 Full Code [id number]  Garba, Mohammad L, MD Inpatient   05/27/2017 2139 06/01/2017 1937 Full Code [id number]  Gardner, Jared M, DO ED   05/04/2017 2105 05/07/2017 2127 Full Code [id number]  Kakrakandy, Arshad N, MD ED   04/01/2017 1813 04/03/2017 1841 Full Code [id number]  Alekh, Kshitiz, MD ED   02/28/2017 0045 03/05/2017 2133 Full Code [id number]  Gardner, Jared M, DO ED   02/02/2017 0309 02/04/2017 2250 Full Code [id number]  Emokpae, Ejiroghene E, MD Inpatient   12/10/2016 0403 12/14/2016 1705 Full Code [id number]  Gardner, Jared M, DO ED   11/06/2016 2039 11/11/2016 1659 Full Code [id number]  Grunz, Ryan B, MD Inpatient   10/06/2016 1647 10/09/2016 1809 Full Code [id number]  Matthews, Michelle A, MD ED   10/02/2016 1512 10/05/2016 1042 Full Code [id number]  Jegede, Olugbemiga E, MD Inpatient   09/15/2016 1723 09/19/2016 2025 Full Code [id number]  Hollis, Lachina M, FNP Inpatient   08/20/2016 1812 08/30/2016 1613 Full Code [id number]  Matthews, Michelle A, MD Inpatient   07/25/2016 2320 07/29/2016 2126 Full Code [id number]  Smith, Rondell A, MD ED   06/26/2016 2154 07/02/2016 1954 Full Code [id number]  Gardner, Jared M, DO ED   06/10/2016 0511 06/15/2016 1511 Full Code [id number]  Gardner, Jared M, DO ED   05/29/2016 2007 05/31/2016 1803 Full Code [id number]  Mikhail, Maryann, DO Inpatient   05/19/2016 1914 05/25/2016 2005 Full Code [id number]  Feliz Ortiz, Abraham, MD Inpatient   04/24/2016 1059 05/02/2016 1750 Full Code [id number]  Garba, Mohammad L, MD Inpatient   04/17/2016 2105 04/20/2016 1913 Full Code [id number]  Ortiz, David Manuel, MD Inpatient   04/17/2016 2105 04/17/2016 2105 Full Code [id number]  Ortiz, David Manuel, MD Inpatient   03/31/2016 1638 04/07/2016 1808 Full Code [id number]  Matthews, Michelle A, MD Inpatient   02/26/2016 1859 03/03/2016 2104 Full Code [id number]  Matthews, Michelle A, MD Inpatient   02/16/2016 1033 02/17/2016 1101 Full Code [id number]  Jegede, Olugbemiga E, MD Inpatient   01/30/2016 0751 02/05/2016 1904 Full Code [id number]  Niu, Xilin, MD ED   01/05/2016 0837 01/09/2016 1727 Full Code [id number]  Jegede, Olugbemiga E, MD Inpatient   12/27/2015 0358 12/29/2015 2009 Full Code [id number]  Niu, Xilin, MD ED   11/09/2015 0118 11/12/2015 1837 Full Code [id number]  Smith, Rondell A, MD ED   10/31/2015 1854 11/02/2015 1456 Full Code [id number]  Smith, Virginia, CNM Inpatient   10/30/2015 1446 10/31/2015 1854 Full Code [id number]  [name], Carolyn, MD Inpatient   10/26/2015 2125 10/30/2015 1446 Full Code [id number]  Wouk, Noah Bedford, MD Inpatient   10/15/2015 1857 10/18/2015 2027 Full Code [id number]  Matthews, Michelle A, MD Inpatient   10/06/2015 2258 10/10/2015 1623 Full Code [id number]  Carter, Nikki, MD ED   09/22/2015 2026 09/27/2015 1735 Full Code [id number]  Anyanwu, Ugonna A, MD Inpatient   09/07/2015 1431 09/14/2015 1454 Full Code [id number]  Matthews, Michelle A, MD Inpatient   08/10/2015 1405 08/17/2015 1638 Full Code [id number]  Matthews, Michelle A, MD Inpatient   07/24/2015 0531 08/02/2015 1817 Full Code [id number]  Gambino, Christina M, MD Inpatient   07/24/2015 0447 07/24/2015 0531 Full Code [id number]  Gambino, Christina M, MD Inpatient   07/24/2015 0353   07/24/2015 0447 Full Code [id number]  Carlyle Dolly, MD Inpatient   07/12/2015 0214 07/14/2015 2028 Full Code [id number]  Ivor Costa, MD ED   06/28/2015 1037 07/03/2015 1441 Full Code [id number]  [name], MD Inpatient   05/31/2015 1459 06/05/2015 2049 Full Code [id number]  [name], MD ED   05/02/2015 1639 05/08/2015 1644 Full Code [id number]  [name], MD Inpatient   04/14/2015 1741 04/22/2015 2006 Full Code [id number]  Tresa Garter, MD  Inpatient   04/14/2015 1741 04/14/2015 1741 Full Code [id number]  Tresa Garter, MD Inpatient   04/14/2015 1153 04/14/2015 1741 Full Code [id number]  Tresa Garter, MD Inpatient   03/30/2015 1104 04/03/2015 1742 Full Code [id number]  Tresa Garter, MD Inpatient   03/22/2015 2042 03/26/2015 1747 Full Code [id number]  Reubin Milan, MD ED   01/25/2015 1323 01/27/2015 1554 Full Code [id number]  Elwyn Reach, MD Inpatient   12/07/2014 1557 12/10/2014 1720 Full Code [id number]  Keitha Butte, CNM Inpatient   12/05/2014 0920 12/07/2014 1557 Full Code [id number]  Tami Ribas, RN Inpatient   11/26/2014 2216 12/01/2014 2033 Full Code [id number]  Virginia Crews, MD Inpatient   11/19/2014 0129 11/21/2014 1812 Full Code [id number]  Seabron Spates, Garfield Inpatient   10/23/2014 2118 10/30/2014 1932 Full Code [id number]  Truett Mainland, DO Inpatient   10/11/2014 0010 10/14/2014 1707 Full Code [id number]  Larey Days, [place] Inpatient   09/18/2014 1545 09/26/2014 1656 Full Code [id number]  Lavonia Drafts, MD Inpatient   09/08/2014 0117 09/10/2014 1821 Full Code [id number]  Lavonia Drafts, MD Inpatient   09/08/2014 0117 09/08/2014 0117 Full Code [id number]  Lavonia Drafts, MD Inpatient   08/09/2014 2137 08/15/2014 2048 Full Code [id number]  Tresea Mall, CNM Inpatient   07/14/2014 0301 07/16/2014 1653 Full Code [id number]  Brantley Persons, RN Inpatient   06/25/2014 1651 06/28/2014 1705 Full Code [id number]  [name], MD Inpatient   06/02/2014 0106 06/05/2014 1936 Full Code [id number]  Elberta Leatherwood, MD Inpatient   05/03/2014 1818 05/06/2014 2158 Full Code [id number]  Markus Daft, MD Inpatient   05/02/2014 1207 05/03/2014 1818 Full Code [id number]  [name], MD ED   04/10/2014 1641 04/18/2014 2117 Full Code [id number]  Greggory Keen, MD Inpatient   04/09/2014 1028 04/10/2014 1641 Full Code [id number]  Theodis Blaze, MD Inpatient   02/16/2014 2020  02/25/2014 1411 Full Code [id number]  Caren Griffins, MD ED   01/29/2014 0343 02/01/2014 1442 Full Code [id number]  Rise Patience, MD Inpatient   01/13/2014 1815 01/16/2014 1304 Full Code [id number]  Robbie Lis, MD Inpatient   11/25/2013 1437 11/30/2013 2009 Full Code [id number]  Verlee Monte, MD Inpatient   09/24/2013 0615 09/27/2013 1532 Full Code [id number]  Rise Patience, MD Inpatient   07/31/2013 0312 08/04/2013 2047 Full Code [id number]  Etta Quill, DO ED   07/16/2013 0907 07/16/2013 2310 Full Code [id number]  [name], MD Inpatient   05/28/2013 0515 06/01/2013 2117 Full Code [id number]  Berle Mull, MD ED   03/23/2013 0430 03/29/2013 1937 Full Code [id number]  Theressa Millard, MD ED   02/11/2013 0803 02/14/2013 1639 Full Code [id number]  Rise Patience, MD Inpatient   01/27/2013 1257 01/30/2013 1853 Full Code [id number]  Domenic Polite, MD Inpatient   10/05/2012 2154 10/12/2012 2107 Full Code [id number]  Theressa Millard, MD ED   08/27/2012 1955 08/31/2012 1833 Full Code [id number]  Candiss Norse,  Margaree Mackintosh, MD Inpatient   06/22/2012 1436 06/27/2012 1457 Full Code [id number]  Jorene Guest, RN Inpatient   03/28/2012 2122 04/04/2012 1929 Full Code [id number]  Verl Dicker, PA-C ED   03/24/2012 0432 03/25/2012 1144 Full Code [id number]  Martie Lee, PA ED   03/04/2012 0448 03/15/2012 2258 Full Code [id number]  Kennith Center, RN Inpatient   02/13/2012 0647 02/17/2012 0124 Full Code [id number]  Jacqualyn Posey, RN Inpatient   12/17/2011 1614 12/22/2011 1716 Full Code [id number]  Theodis Blaze, MD Inpatient   12/16/2011 2205 12/17/2011 1614 Full Code [id number]  Threasa Beards, MD ED   11/30/2011 0309 12/03/2011 1745 Full Code [id number]  Evelina Dun, RN ED   11/08/2011 0723 11/14/2011 1823 Full Code [id number]  Ron Parker, RN ED   09/05/2011 0204 09/05/2011 0732 Full Code [id number]  Garald Balding, NP ED   08/05/2011 0755 08/05/2011 1852 Full Code [id number]  Rainey Pines, RN ED    08/05/2011 0028 08/05/2011 0755 Full Code [id number]  Charlena Cross, MD ED   07/13/2011 1149 07/15/2011 1847 Full Code [id number]  Julieta Bellini, NP ED   06/09/2011 0048 06/09/2011 0959 Full Code [id number]  Charlena Cross, MD ED   05/31/2011 1828 06/01/2011 1837 Full Code [id number]  Jenel Lucks, RN Inpatient   05/26/2011 0218 05/26/2011 1419 Full Code [id number]  Molpus, Karen Chafe, MD ED   05/06/2011 0700 05/08/2011 1609 Full Code [id number]  Weston, [place], MD Inpatient   04/17/2011 1329 04/19/2011 1650 Full Code [id number]  Judithann Sheen, MD ED   03/30/2011 0039 04/03/2011 0025 Full Code [id number]  Hoy Morn, MD ED      Home/SNF/Other Home  Chief Complaint sickle cell pain  Level of Care/Admitting Diagnosis ED Disposition    ED Disposition Condition [facility] Area: [facility] [[id number]]  Level of Care: Telemetry [5]  Admit to tele based on following criteria: Other see comments  Comments: chest pain  Diagnosis: Sickle cell crisis [facility]) [[id number]]  Admitting Physician: Toy Baker [[id number]]  Attending Physician: Toy Baker [[id number]]  Estimated length of stay: 3 - 4 days  Certification:: I certify this patient will need inpatient services for at least 2 midnights  PT Class (Do Not Modify): Inpatient [101]  PT Acc Code (Do Not Modify): Private [1]       Medical History Past Medical History:  Diagnosis Date  . Blood transfusion    "lots"  . Blood transfusion without reported diagnosis   . Chronic back pain    "very severe; have knot in my back; from tight muscle; take RX and exercise for it"  . Depression 01/06/2011  . Exertional dyspnea    "sometimes"  . Genital HSV   . GERD (gastroesophageal reflux disease) 02/17/2011  . Migraines 11/08/11   "@ least twice/month"  . 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  . Mood swings 11/08/11   "I go back and forth; real bad"  . Sickle cell anemia ([name])   . Sickle  cell anemia with crisis ([place])   . Thrombocytosis ([place]) 11/09/2015  . Trichotillomania    h/o    Allergies Allergies  Allergen Reactions  . Carrot [Daucus Carota] Hives and Swelling    Allergic to carrots  . Latex Rash    IV Location/Drains/Wounds Patient Lines/Drains/Airways Status   Active Line/Drains/Airways    Name:   Placement date:   Placement time:  Site:   Days:   Implanted Port Right Chest   -    -    Chest             Labs/Imaging Results for orders placed or performed during the hospital encounter of 09/29/17 (from the past 48 hour(s))  Comprehensive metabolic panel     Status: Abnormal   Collection Time: 09/29/17  7:49 PM  Result Value Ref Range   Sodium 139 135 - 145 mmol/L   Potassium 3.5 3.5 - 5.1 mmol/L   Chloride 107 101 - 111 mmol/L   CO2 22 22 - 32 mmol/L   Glucose, Bld 98 65 - 99 mg/dL   BUN 5 (L) 6 - 20 mg/dL   Creatinine, Ser 0.64 0.44 - 1.00 mg/dL   Calcium 9.5 8.9 - 10.3 mg/dL   Total Protein 8.1 6.5 - 8.1 g/dL   Albumin 4.5 3.5 - 5.0 g/dL   AST 20 15 - 41 U/L   ALT 19 14 - 54 U/L   Alkaline Phosphatase 83 38 - 126 U/L   Total Bilirubin 2.7 (H) 0.3 - 1.2 mg/dL   GFR calc non Af Amer >60 >60 mL/min   GFR calc Af Amer >60 >60 mL/min    Comment: (NOTE) The eGFR has been calculated using the CKD EPI equation. This calculation has not been validated in all clinical situations. eGFR's persistently <60 mL/min signify possible Chronic Kidney Disease.    Anion gap 10 5 - 15    Comment: Performed at [facility], [address] [place], [place] [address]  CBC with Differential     Status: Abnormal   Collection Time: 09/29/17  7:49 PM  Result Value Ref Range   WBC 11.7 (H) 4.0 - 10.5 K/uL   RBC 3.00 (L) 3.87 - 5.11 MIL/uL   Hemoglobin 9.3 (L) 12.0 - 15.0 g/dL   HCT 27.8 (L) 36.0 - 46.0 %   MCV 92.7 78.0 - 100.0 fL   MCH 31.0 26.0 - 34.0 pg   MCHC 33.5 30.0 - 36.0 g/dL   RDW 18.7 (H) 11.5 - 15.5 %   Platelets 600 (H) 150 -  400 K/uL   Neutrophils Relative % 80 %   Neutro Abs 9.3 (H) 1.7 - 7.7 K/uL   Lymphocytes Relative 12 %   Lymphs Abs 1.4 0.7 - 4.0 K/uL   Monocytes Relative 8 %   Monocytes Absolute 1.0 0.1 - 1.0 K/uL   Eosinophils Relative 0 %   Eosinophils Absolute 0.1 0.0 - 0.7 K/uL   Basophils Relative 0 %   Basophils Absolute 0.0 0.0 - 0.1 K/uL    Comment: Performed at [facility], [address] [place], [place] [address]  Reticulocytes     Status: Abnormal   Collection Time: 09/29/17  7:49 PM  Result Value Ref Range   Retic Ct Pct 18.9 (H) 0.4 - 3.1 %   RBC. 3.00 (L) 3.87 - 5.11 MIL/uL   Retic Count, Absolute 567.0 (H) 19.0 - 186.0 K/uL    Comment: Performed at [facility], [address] [place], [place] [address]  I-stat troponin, ED     Status: None   Collection Time: 09/29/17  7:55 PM  Result Value Ref Range   Troponin i, poc 0.00 0.00 - 0.08 ng/mL   Comment 3            Comment: Due to the release kinetics of cTnI, a negative result within the first hours   of the onset of symptoms does not rule out myocardial infarction with certainty. If myocardial infarction is still suspected, repeat the test at appropriate intervals.   I-Stat beta hCG blood, ED     Status: Abnormal   Collection Time: 09/29/17  7:56 PM  Result Value Ref Range   I-stat hCG, quantitative 8.1 (H) <5 mIU/mL   Comment 3            Comment:   GEST. AGE      CONC.  (mIU/mL)   <=1 WEEK        5 - 50     2 WEEKS       50 - 500     3 WEEKS       100 - 10,000     4 WEEKS     1,000 - 30,000        FEMALE AND NON-PREGNANT FEMALE:     LESS THAN 5 mIU/mL   POC Urine Pregnancy, ED (do NOT order at [facility])     Status: None   Collection Time: 09/29/17  8:27 PM  Result Value Ref Range   Preg Test, Ur NEGATIVE NEGATIVE    Comment:        THE SENSITIVITY OF THIS METHODOLOGY IS >24 mIU/mL    *Note: Due to a large number of results and/or encounters for the requested time period, some  results have not been displayed. A complete set of results can be found in Results Review.   Dg Chest 2 View  Result Date: 09/29/2017 CLINICAL DATA:  Chest pain.  History of sickle cell disease. EXAM: CHEST - 2 VIEW COMPARISON:  Chest x-ray dated July 10, 2017. FINDINGS: Unchanged right chest wall port catheter. The heart size and mediastinal contours are within normal limits. Normal pulmonary vascularity. No focal consolidation, pleural effusion, or pneumothorax. Unchanged sclerosis in the bilateral humeral heads. No acute osseous abnormality. IMPRESSION: 1.  No active cardiopulmonary disease. 2. Unchanged bilateral humeral head avascular necrosis. Electronically Signed   By: Titus Dubin M.D.   On: 09/29/2017 18:15   Dg Thoracic Spine 2 View  Result Date: 09/29/2017 CLINICAL DATA:  Sickle cell pain upper, lower back and right ankle pain - all without injury x 1 day. EXAM: THORACIC SPINE 2 VIEWS COMPARISON:  None. FINDINGS: No acute appearing osseous abnormality. There is no evidence of thoracic spine fracture. Alignment is normal. Majority of the vertebral bodies within the mid and lower thoracic spine have an H-shaped configuration compatible with sequela of sickle cell disease. Visualized paravertebral soft tissues are unremarkable. Cholecystectomy clips in the RIGHT upper quadrant. RIGHT chest wall Port-A-Cath in place with tip at the level of the mid/lower SVC. IMPRESSION: 1. No acute findings. 2. Configuration of the thoracic vertebral bodies within the mid and lower thoracic spine are compatible with sequela of sickle cell disease. Electronically Signed   By: Franki Cabot M.D.   On: 09/29/2017 21:08   Dg Lumbar Spine Complete  Result Date: 09/29/2017 CLINICAL DATA:  Sickle cell pain upper, lower back and right ankle pain - all without injury x 1 day EXAM: LUMBAR SPINE - COMPLETE 4+ VIEW COMPARISON:  None. FINDINGS: No acute appearing osseous abnormality. There is no evidence of lumbar  spine fracture. Alignment is normal. As in the thoracic spine, configuration of the majority of the lumbar vertebral bodies is H-shaped consistent with sequela of sickle cell. Intervertebral disc spaces are maintained. Visualized paravertebral soft tissues are unremarkable. Cholecystectomy clips in the RIGHT  upper quadrant. IMPRESSION: No acute findings. Majority of the lumbar vertebral bodies are H-shaped, consistent with sequela of sickle cell. Electronically Signed   By: Stan  Maynard M.D.   On: 09/29/2017 22:01   Dg Ankle Complete Right  Result Date: 09/29/2017 CLINICAL DATA:  Sickle cell pain, no injury. EXAM: RIGHT ANKLE - COMPLETE 3+ VIEW COMPARISON:  RIGHT ankle radiograph July 16, 2013 FINDINGS: No fracture deformity nor dislocation. Faint sclerosis calcaneus and distal tibia. The ankle mortise appears congruent and the tibiofibular syndesmosis intact. No destructive bony lesions. Soft tissue planes are non-suspicious. IMPRESSION: Patchy sclerosis suggesting bone infarcts. No acute osseous process. Electronically Signed   By: Courtnay  Bloomer M.D.   On: 09/29/2017 21:06    Pending Labs Unresulted Labs (From admission, onward)   Start     Ordered   Signed and Held  Magnesium  Tomorrow morning,   R     Signed and Held   Signed and Held  Phosphorus  Tomorrow morning,   R     Signed and Held      Vitals/Pain Today's Vitals   09/29/17 1742 09/29/17 2052 09/29/17 2201 09/29/17 2209  BP:    105/65  Pulse:    (!) 59  Resp:    18  Temp:      TempSrc:      SpO2:    96%  Weight: 190 lb (86.2 kg)     Height: 5' 1" (1.549 m)     PainSc:  7  6      Isolation Precautions No active isolations  Medications Medications  0.45 % sodium chloride infusion ( Intravenous Stopped 09/30/17 0026)  ondansetron (ZOFRAN) injection 4 mg (4 mg Intravenous Given 09/29/17 1941)  diphenhydrAMINE (BENADRYL) injection 12.5 mg (12.5 mg Intravenous Not Given 09/29/17 2207)  ketorolac (TORADOL) 30 MG/ML  injection 30 mg (30 mg Intravenous Given 09/29/17 1941)  HYDROmorphone (DILAUDID) injection 2 mg (2 mg Intravenous Given 09/29/17 1942)    Or  HYDROmorphone (DILAUDID) injection 2 mg ( Subcutaneous See Alternative 09/29/17 1942)  HYDROmorphone (DILAUDID) injection 2 mg (2 mg Intravenous Given 09/29/17 2056)    Or  HYDROmorphone (DILAUDID) injection 2 mg ( Subcutaneous See Alternative 09/29/17 2056)  HYDROmorphone (DILAUDID) injection 2 mg (2 mg Intravenous Given 09/29/17 2202)    Or  HYDROmorphone (DILAUDID) injection 2 mg ( Subcutaneous See Alternative 09/29/17 2202)  diphenhydrAMINE (BENADRYL) injection 25 mg (25 mg Intravenous Given 09/29/17 1942)  diphenhydrAMINE (BENADRYL) injection 12.5 mg (12.5 mg Intravenous Given 09/29/17 2207)    Mobility walks  

## 2017-09-30 NOTE — Progress Notes (Signed)
Patient ID: [name], female   DOB: [birth year], 27 y.o.   MRN: [id number] Subjective:  [name] is a 27 y.o. female with medical history significant of sickle cell disease, avascular necrosis of the hips, polysubstance use disorder including cocaine. She was admitted yesterday for sickle cell pain crisis and bony infarct right ankle joint. She has no new complaint this morning, her pain is still at 8/10, no joint swelling or redness, no fever, no chest pain, no SOB.  Objective:  Vital signs in last 24 hours:  Vitals:   09/30/17 0524 09/30/17 0551 09/30/17 0752 09/30/17 0951  BP: 107/65   119/73  Pulse: 70   89  Resp:  15 12 12   Temp: 98.3 F (36.8 C)   98.2 F (36.8 C)  TempSrc: Oral   Oral  SpO2: 97% 97% 98% 95%  Weight:      Height:       Intake/Output from previous day:   Intake/Output Summary (Last 24 hours) at 09/30/2017 1137 Last data filed at 09/30/2017 1000 Gross per 24 hour  Intake 2240 ml  Output 1 ml  Net 2239 ml    Physical Exam: General: Alert, awake, oriented x3, in no acute distress.  HEENT: [place]/AT [place]ER[place], EOMI Neck: Trachea midline,  no masses, no thyromegal,y no JVD, no carotid bruit OROPHARYNX:  Moist, No exudate/ erythema/lesions.  Heart: Regular rate and rhythm, without murmurs, rubs, gallops, PMI non-displaced, no heaves or thrills on palpation.  Lungs: Clear to auscultation, no wheezing or rhonchi noted. No increased vocal fremitus resonant to percussion  Abdomen: Soft, nontender, nondistended, positive bowel sounds, no masses no hepatosplenomegaly noted..  Neuro: No focal neurological deficits noted cranial nerves II through XII grossly intact. DTRs 2+ bilaterally upper and lower extremities. Strength 5 out of 5 in bilateral upper and lower extremities. Musculoskeletal: No warm swelling or erythema around joints, no spinal tenderness noted. Psychiatric: Patient alert and oriented x3, good insight and cognition, good recent to remote  recall. Lymph node survey: No cervical axillary or inguinal lymphadenopathy noted.  Lab Results:  Basic Metabolic Panel:    Component Value Date/Time   NA 139 09/29/2017 1949   NA 143 07/24/2017 1416   NA 139 05/06/2012 1645   K 3.5 09/29/2017 1949   K 3.9 05/06/2012 1645   CL 107 09/29/2017 1949   CL 110 (H) 05/06/2012 1645   CO2 22 09/29/2017 1949   CO2 22 05/06/2012 1645   BUN 5 (L) 09/29/2017 1949   BUN 3 (L) 07/24/2017 1416   BUN 9 05/06/2012 1645   CREATININE 0.64 09/29/2017 1949   CREATININE 0.43 (L) 07/06/2015 1356   GLUCOSE 98 09/29/2017 1949   GLUCOSE 84 05/06/2012 1645   CALCIUM 9.5 09/29/2017 1949   CALCIUM 8.3 (L) 05/06/2012 1645   CBC:    Component Value Date/Time   WBC 11.7 (H) 09/29/2017 1949   HGB 9.3 (L) 09/29/2017 1949   HGB 9.3 (L) 07/24/2017 1421   HCT 27.8 (L) 09/29/2017 1949   HCT 28.9 (L) 07/24/2017 1421   PLT 600 (H) 09/29/2017 1949   PLT 666 (H) 07/24/2017 1421   MCV 92.7 09/29/2017 1949   MCV 83 07/24/2017 1421   MCV 94 05/11/2012 0721   NEUTROABS 9.3 (H) 09/29/2017 1949   NEUTROABS 5.4 07/24/2017 1421   NEUTROABS 10.9 (H) 05/10/2012 0552   LYMPHSABS 1.4 09/29/2017 1949   LYMPHSABS 1.5 07/24/2017 1421   LYMPHSABS 3.5 05/10/2012 0552   MONOABS 1.0 09/29/2017 1949  MONOABS 1.7 (H) 05/10/2012 0552   EOSABS 0.1 09/29/2017 1949   EOSABS 0.4 07/24/2017 1421   EOSABS 0.8 (H) 05/10/2012 0552   BASOSABS 0.0 09/29/2017 1949   BASOSABS 0.1 07/24/2017 1421   BASOSABS 0.1 05/10/2012 0552    No results found for this or any previous visit (from the past 240 hour(s)).  Studies/Results: Dg Chest 2 View  Result Date: 09/29/2017 CLINICAL DATA:  Chest pain.  History of sickle cell disease. EXAM: CHEST - 2 VIEW COMPARISON:  Chest x-ray dated July 10, 2017. FINDINGS: Unchanged right chest wall port catheter. The heart size and mediastinal contours are within normal limits. Normal pulmonary vascularity. No focal consolidation, pleural effusion,  or pneumothorax. Unchanged sclerosis in the bilateral humeral heads. No acute osseous abnormality. IMPRESSION: 1.  No active cardiopulmonary disease. 2. Unchanged bilateral humeral head avascular necrosis. Electronically Signed   By: Titus Dubin M.D.   On: 09/29/2017 18:15   Dg Thoracic Spine 2 View  Result Date: 09/29/2017 CLINICAL DATA:  Sickle cell pain upper, lower back and right ankle pain - all without injury x 1 day. EXAM: THORACIC SPINE 2 VIEWS COMPARISON:  None. FINDINGS: No acute appearing osseous abnormality. There is no evidence of thoracic spine fracture. Alignment is normal. Majority of the vertebral bodies within the mid and lower thoracic spine have an H-shaped configuration compatible with sequela of sickle cell disease. Visualized paravertebral soft tissues are unremarkable. Cholecystectomy clips in the RIGHT upper quadrant. RIGHT chest wall Port-A-Cath in place with tip at the level of the mid/lower SVC. IMPRESSION: 1. No acute findings. 2. Configuration of the thoracic vertebral bodies within the mid and lower thoracic spine are compatible with sequela of sickle cell disease. Electronically Signed   By: Franki Cabot M.D.   On: 09/29/2017 21:08   Dg Lumbar Spine Complete  Result Date: 09/29/2017 CLINICAL DATA:  Sickle cell pain upper, lower back and right ankle pain - all without injury x 1 day EXAM: LUMBAR SPINE - COMPLETE 4+ VIEW COMPARISON:  None. FINDINGS: No acute appearing osseous abnormality. There is no evidence of lumbar spine fracture. Alignment is normal. As in the thoracic spine, configuration of the majority of the lumbar vertebral bodies is H-shaped consistent with sequela of sickle cell. Intervertebral disc spaces are maintained. Visualized paravertebral soft tissues are unremarkable. Cholecystectomy clips in the RIGHT upper quadrant. IMPRESSION: No acute findings. Majority of the lumbar vertebral bodies are H-shaped, consistent with sequela of sickle cell.  Electronically Signed   By: Franki Cabot M.D.   On: 09/29/2017 22:01   Dg Ankle Complete Right  Result Date: 09/29/2017 CLINICAL DATA:  Sickle cell pain, no injury. EXAM: RIGHT ANKLE - COMPLETE 3+ VIEW COMPARISON:  RIGHT ankle radiograph July 16, 2013 FINDINGS: No fracture deformity nor dislocation. Faint sclerosis calcaneus and distal tibia. The ankle mortise appears congruent and the tibiofibular syndesmosis intact. No destructive bony lesions. Soft tissue planes are non-suspicious. IMPRESSION: Patchy sclerosis suggesting bone infarcts. No acute osseous process. Electronically Signed   By: Elon Alas M.D.   On: 09/29/2017 21:06    Medications: Scheduled Meds: . enoxaparin (LOVENOX) injection  40 mg Subcutaneous Q24H  . folic acid  1 mg Oral Daily  . gabapentin  300 mg Oral TID  . HYDROmorphone   Intravenous Q4H  . hydroxyurea  1,000 mg Oral Daily  . ketorolac  30 mg Intravenous Q6H  . morphine  30 mg Oral Q12H  . senna-docusate  1 tablet Oral BID   Continuous  Infusions: . dextrose 5 % and 0.45% NaCl 100 mL/hr at 09/30/17 1115  . 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 Hb Sickle Cell Disease with crisis: Continue IVF D5 .45% Saline @ 125 mls/hour, continue weight based Dilaudid PCA, IV Toradol 30 mg Q 6 H, Monitor vitals very closely, Re-evaluate pain scale regularly, 2 L of Oxygen by  [place]. 2. Leukocytosis: Likely reactive to sickle cell crisis, no evidence of infection. Will monitor 3. Sickle Cell Anemia: Hb is at baseline.  4. Chronic pain Syndrome: Restart MS Contin, patient counseled extensively about pain and other non-pharmacologic means of pain control. 5. Cocaine Use Disorder: Patient was seen at [facility] sickle cell center to establish care and for pain meds prescription, she was declined prescription because her UDS was positive for cocaine. She is soliciting PCP at the moment but no one is willing to take her because of her drug use disorder. Patient denies this, saying she only uses cocaine occasionally. Yet today, her UDS is again +ve for cocaine. Patient has been counseled extensively.   Code Status: Full Code Family Communication: N/A Disposition Plan: Not yet ready for discharge  Kiefer [place]  If 7PM-7AM, please contact night-coverage.  09/30/2017, 11:37 AM  LOS: 1 day

## 2017-10-01 NOTE — Progress Notes (Signed)
Patient ID: [name], female   DOB: 08-23-1990, 27 y.o.   MRN: [id number] Subjective:  Patient has no new complaint, pain is down to 7 but not near her baseline. She asked for IV Dilaudid push. She has no fever, no chest pain, no SOB. She is able to ambulate but with pain in her right foot.  Objective:  Vital signs in last 24 hours:  Vitals:   10/01/17 1042 10/01/17 1241 10/01/17 1551 10/01/17 1658  BP: 115/77  (!) 93/55   Pulse: 94  93   Resp: 12 12 18 13   Temp: 98.6 F (37 C)  97.8 F (36.6 C)   TempSrc: Oral  Oral   SpO2: 93% 91% 99% 94%  Weight:      Height:       Intake/Output from previous day:   Intake/Output Summary (Last 24 hours) at 10/01/2017 1833 Last data filed at 10/01/2017 1800 Gross per 24 hour  Intake 4688.75 ml  Output 2 ml  Net 4686.75 ml   Physical Exam: General: Alert, awake, oriented x3, in no acute distress.  HEENT: [place]/AT [place], EOMI Neck: Trachea midline,  no masses, no thyromegal,y no JVD, no carotid bruit OROPHARYNX:  Moist, No exudate/ erythema/lesions.  Heart: Regular rate and rhythm, without murmurs, rubs, gallops, PMI non-displaced, no heaves or thrills on palpation.  Lungs: Clear to auscultation, no wheezing or rhonchi noted. No increased vocal fremitus resonant to percussion  Abdomen: Soft, nontender, nondistended, positive bowel sounds, no masses no hepatosplenomegaly noted..  Neuro: No focal neurological deficits noted cranial nerves II through XII grossly intact. DTRs 2+ bilaterally upper and lower extremities. Strength 5 out of 5 in bilateral upper and lower extremities. Musculoskeletal: No warm swelling or erythema around joints, no spinal tenderness noted. Psychiatric: Patient alert and oriented x3, good insight and cognition, good recent to remote recall. Lymph node survey: No cervical axillary or inguinal lymphadenopathy noted.  Lab Results:  Basic Metabolic Panel:    Component Value Date/Time   NA 139 09/29/2017 1949   NA 143 07/24/2017 1416   NA 139 05/06/2012 1645   K 3.5 09/29/2017 1949   K 3.9 05/06/2012 1645   CL 107 09/29/2017 1949   CL 110 (H) 05/06/2012 1645   CO2 22 09/29/2017 1949   CO2 22 05/06/2012 1645   BUN 5 (L) 09/29/2017 1949   BUN 3 (L) 07/24/2017 1416   BUN 9 05/06/2012 1645   CREATININE 0.64 09/29/2017 1949   CREATININE 0.43 (L) 07/06/2015 1356   GLUCOSE 98 09/29/2017 1949   GLUCOSE 84 05/06/2012 1645   CALCIUM 9.5 09/29/2017 1949   CALCIUM 8.3 (L) 05/06/2012 1645   CBC:    Component Value Date/Time   WBC 11.7 (H) 09/29/2017 1949   HGB 9.3 (L) 09/29/2017 1949   HGB 9.3 (L) 07/24/2017 1421   HCT 27.8 (L) 09/29/2017 1949   HCT 28.9 (L) 07/24/2017 1421   PLT 600 (H) 09/29/2017 1949   PLT 666 (H) 07/24/2017 1421   MCV 92.7 09/29/2017 1949   MCV 83 07/24/2017 1421   MCV 94 05/11/2012 0721   NEUTROABS 9.3 (H) 09/29/2017 1949   NEUTROABS 5.4 07/24/2017 1421   NEUTROABS 10.9 (H) 05/10/2012 0552   LYMPHSABS 1.4 09/29/2017 1949   LYMPHSABS 1.5 07/24/2017 1421   LYMPHSABS 3.5 05/10/2012 0552   MONOABS 1.0 09/29/2017 1949   MONOABS 1.7 (H) 05/10/2012 0552   EOSABS 0.1 09/29/2017 1949   EOSABS 0.4 07/24/2017 1421   EOSABS 0.8 (H) 05/10/2012 1856  BASOSABS 0.0 09/29/2017 1949   BASOSABS 0.1 07/24/2017 1421   BASOSABS 0.1 05/10/2012 0552    No results found for this or any previous visit (from the past 240 hour(s)).  Studies/Results: Dg Thoracic Spine 2 View  Result Date: 09/29/2017 CLINICAL DATA:  Sickle cell pain upper, lower back and right ankle pain - all without injury x 1 day. EXAM: THORACIC SPINE 2 VIEWS COMPARISON:  None. FINDINGS: No acute appearing osseous abnormality. There is no evidence of thoracic spine fracture. Alignment is normal. Majority of the vertebral bodies within the mid and lower thoracic spine have an H-shaped configuration compatible with sequela of sickle cell disease. Visualized paravertebral soft tissues are unremarkable. Cholecystectomy clips  in the RIGHT upper quadrant. RIGHT chest wall Port-A-Cath in place with tip at the level of the mid/lower SVC. IMPRESSION: 1. No acute findings. 2. Configuration of the thoracic vertebral bodies within the mid and lower thoracic spine are compatible with sequela of sickle cell disease. Electronically Signed   By: Franki Cabot M.D.   On: 09/29/2017 21:08   Dg Lumbar Spine Complete  Result Date: 09/29/2017 CLINICAL DATA:  Sickle cell pain upper, lower back and right ankle pain - all without injury x 1 day EXAM: LUMBAR SPINE - COMPLETE 4+ VIEW COMPARISON:  None. FINDINGS: No acute appearing osseous abnormality. There is no evidence of lumbar spine fracture. Alignment is normal. As in the thoracic spine, configuration of the majority of the lumbar vertebral bodies is H-shaped consistent with sequela of sickle cell. Intervertebral disc spaces are maintained. Visualized paravertebral soft tissues are unremarkable. Cholecystectomy clips in the RIGHT upper quadrant. IMPRESSION: No acute findings. Majority of the lumbar vertebral bodies are H-shaped, consistent with sequela of sickle cell. Electronically Signed   By: Franki Cabot M.D.   On: 09/29/2017 22:01   Dg Ankle Complete Right  Result Date: 09/29/2017 CLINICAL DATA:  Sickle cell pain, no injury. EXAM: RIGHT ANKLE - COMPLETE 3+ VIEW COMPARISON:  RIGHT ankle radiograph July 16, 2013 FINDINGS: No fracture deformity nor dislocation. Faint sclerosis calcaneus and distal tibia. The ankle mortise appears congruent and the tibiofibular syndesmosis intact. No destructive bony lesions. Soft tissue planes are non-suspicious. IMPRESSION: Patchy sclerosis suggesting bone infarcts. No acute osseous process. Electronically Signed   By: Elon Alas M.D.   On: 09/29/2017 21:06   Medications: Scheduled Meds: . enoxaparin (LOVENOX) injection  40 mg Subcutaneous Q24H  . folic acid  1 mg Oral Daily  . gabapentin  300 mg Oral TID  . HYDROmorphone   Intravenous Q4H  .  hydroxyurea  1,000 mg Oral Daily  . ketorolac  30 mg Intravenous Q6H  . morphine  30 mg Oral Q12H  . senna-docusate  1 tablet Oral BID   Continuous Infusions: . dextrose 5 % and 0.45% NaCl 125 mL/hr at 10/01/17 0817   PRN Meds:.bisacodyl, cyclobenzaprine, diphenhydrAMINE **OR** [DISCONTINUED] diphenhydrAMINE, hydrOXYzine, magnesium citrate, naloxone **AND** [DISCONTINUED] sodium chloride flush, ondansetron **OR** ondansetron (ZOFRAN) IV, polyethylene glycol, sodium chloride flush  Consultants:  None  Procedures:  None  Antibiotics:  None  Assessment/Plan: Active Problems:   Sickle cell pain crisis ([name])   Anemia of chronic disease   Thrombocytosis ([name])   History of DVT (deep vein thrombosis)   Chronic anticoagulation   Hb-SS disease with crisis ([place])   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 Hb Sickle Cell Disease with crisis: Reduce IVF D5 .45% Saline to 75 mls/hour, continue weight based  Dilaudid PCA, IV Toradol 30 mg Q 6 H, Monitor vitals very closely, Re-evaluate pain scale regularly, 2 L of Oxygen by [place]. 2. Leukocytosis: Likely reactive to sickle cell crisis, no evidence of infection. Will monitor 3. Sickle Cell Anemia: Hb is at baseline.  4. Chronic pain Syndrome: Continue MS Contin, patient counseled extensively about pain and other non-pharmacologic means of pain control. 5. Cocaine Use Disorder: Patient was seen at [facility] sickle cell center to establish care and for pain meds prescription, she was declined prescription because her UDS was positive for cocaine. She is soliciting PCP at the moment but no one is willing to take her because of her drug use disorder. Patient denies this, saying she only uses cocaine occasionally. Yet today, her UDS is again +ve for cocaine. Patient has been counseled extensively.   Code Status: Full Code Family Communication: N/A Disposition Plan: Not yet ready for discharge  [place] Weekly  If 7PM-7AM, please  contact night-coverage.  10/01/2017, 6:33 PM  LOS: 2 days

## 2017-10-01 NOTE — Progress Notes (Signed)
PHARMACIST - PHYSICIAN COMMUNICATION  CONCERNING: IV to Oral Route Change Policy  RECOMMENDATION: This patient is receiving diphenhydramine by the intravenous route.  Based on criteria approved by the Pharmacy and Therapeutics Committee, intravenous diphenhydramine is being converted to the equivalent oral dose form(s). Note, patient is ordered both PO and IV diphenhydramine; IV formulation is being discontinued per criteria below.   DESCRIPTION: These criteria include:  Diphenhydramine is not prescribed to treat or prevent a severe allergic reaction  Diphenhydramine is not prescribed as premedication prior to receiving blood product, biologic medication, antimicrobial, or chemotherapy agent  The patient has tolerated at least one dose of an oral or enteral medication  The patient has no evidence of active gastrointestinal bleeding or impaired GI absorption (gastrectomy, short bowel, patient on TNA or NPO).  The patient is not undergoing procedural sedation   If you have questions about this conversion, please contact the Pharmacy Department  []   [phone] )  [facility] []   [phone] )  [name] []   [phone] )  [facility] []   [phone] )  [facility] [x]   [phone] )  [facility]    Lindell Spar, PharmD, [place] Pager: [phone] 10/01/2017 2:44 PM

## 2017-10-02 MED ORDER — OXYCODONE HCL 5 MG PO TABS
20.0000 mg | ORAL_TABLET | ORAL | Status: DC
  Administered 2017-10-02 – 2017-10-03 (×6): 20 mg via ORAL
  Filled 2017-10-02 (×6): qty 4

## 2017-10-02 NOTE — Progress Notes (Signed)
Subjective: 27 year old female admitted with sickle cell painful crisis.  Patient is improving but still has pain as 7 out of 10.  Pain is mainly in her legs and she has not been able to walk around.  No fever or chills.  No nausea vomiting or diarrhea.  Objective: Vital signs in last 24 hours: Temp:  [98.1 F (36.7 C)-98.9 F (37.2 C)] 98.7 F (37.1 C) (05/20 1425) Pulse Rate:  [89-102] 98 (05/20 1425) Resp:  [10-20] 16 (05/20 1644) BP: (103-122)/(58-76) 103/58 (05/20 1425) SpO2:  [93 %-100 %] 98 % (05/20 1644) Weight change:  Last BM Date: 10/01/17  Intake/Output from previous day: 05/19 0701 - 05/20 0700 In: 3862.1 [P.O.:960; I.V.:2902.1] Out: 1 [Urine:1] Intake/Output this shift: No intake/output data recorded.  General appearance: alert, cooperative, appears stated age and no distress Back: symmetric, no curvature. ROM normal. No CVA tenderness. Resp: clear to auscultation bilaterally Cardio: regular rate and rhythm, S1, S2 normal, no murmur, click, rub or gallop GI: soft, non-tender; bowel sounds normal; no masses,  no organomegaly Extremities: extremities normal, atraumatic, no cyanosis or edema Pulses: 2+ and symmetric Neurologic: Grossly normal  Lab Results: Recent Labs    09/29/17 1949  WBC 11.7*  HGB 9.3*  HCT 27.8*  PLT 600*   BMET Recent Labs    09/29/17 1949  NA 139  K 3.5  CL 107  CO2 22  GLUCOSE 98  BUN 5*  CREATININE 0.64  CALCIUM 9.5    Studies/Results: No results found.  Medications: I have reviewed the patient's current medications.  Assessment/Plan:  1. Hb Sickle Cell Disease with crisis: patient has been encouraged to be mobile.  We will continue weight based Dilaudid PCA, IV Toradol 30 mg Q 6 H, Monitor vitals very closely, Re-evaluate pain scale regularly, 2 L of Oxygen by [place]. Start oral pain medications in preparation for possible discharge tomorrow 2. Leukocytosis: Likely reactive to sickle cell crisis, no evidence of infection.  Will recheck in the morning 3. Sickle Cell Anemia: Hb is at baseline. Recheck in the more 4. Chronic pain Syndrome: Continue MS Contin, patient counseled extensively about pain and other non-pharmacologic means of pain control. 5. Cocaine Use Disorder: this is a current problem for the patient. Patient was seen at [facility] sickle cell center to establish care and for pain meds prescription, she was declined prescription because her UDS was positive for cocaine. She is soliciting PCP at the moment but no one is willing to take her because of her drug use disorder. Patient denies this, saying she only uses cocaine occasionally. Yet  her UDS is again +ve for cocaine this admission. Patient has been counseled extensively.      LOS: 3 days   Brittinee Risk,LAWAL 10/02/2017, 6:01 PM

## 2017-10-02 NOTE — Care Management Important Message (Signed)
Important Message  Patient Details  Name: BAYLYNN SHIFFLETT MRN: [id number] Date of Birth: 1991-01-12   Medicare Important Message Given:  Yes    Kerin Salen 10/02/2017, 11:42 AMImportant Message  Patient Details  Name: JOYDAN GRETZINGER MRN: [id number] Date of Birth: 16-Jun-1990   Medicare Important Message Given:  Yes    Kerin Salen 10/02/2017, 11:42 AM

## 2017-10-03 LAB — CBC WITH DIFFERENTIAL/PLATELET
BASOS PCT: 0 %
Basophils Absolute: 0 10*3/uL (ref 0.0–0.1)
EOS PCT: 4 %
Eosinophils Absolute: 0.6 10*3/uL (ref 0.0–0.7)
HEMATOCRIT: 20.7 % — AB (ref 36.0–46.0)
Hemoglobin: 6.8 g/dL — CL (ref 12.0–15.0)
Lymphocytes Relative: 23 %
Lymphs Abs: 3.5 10*3/uL (ref 0.7–4.0)
MCH: 30.5 pg (ref 26.0–34.0)
MCHC: 32.9 g/dL (ref 30.0–36.0)
MCV: 92.8 fL (ref 78.0–100.0)
MONO ABS: 1.1 10*3/uL — AB (ref 0.1–1.0)
Metamyelocytes Relative: 2 %
Monocytes Relative: 7 %
Myelocytes: 1 %
NEUTROS PCT: 63 %
NRBC: 7 /100{WBCs} — AB
Neutro Abs: 10.2 10*3/uL — ABNORMAL HIGH (ref 1.7–7.7)
PLATELETS: 455 10*3/uL — AB (ref 150–400)
RBC: 2.23 MIL/uL — ABNORMAL LOW (ref 3.87–5.11)
RDW: 20 % — ABNORMAL HIGH (ref 11.5–15.5)
WBC: 15.4 10*3/uL — AB (ref 4.0–10.5)

## 2017-10-03 LAB — COMPREHENSIVE METABOLIC PANEL
ALK PHOS: 81 U/L (ref 38–126)
ALT: 22 U/L (ref 14–54)
AST: 28 U/L (ref 15–41)
Albumin: 3.8 g/dL (ref 3.5–5.0)
Anion gap: 8 (ref 5–15)
BILIRUBIN TOTAL: 2.3 mg/dL — AB (ref 0.3–1.2)
BUN: 9 mg/dL (ref 6–20)
CALCIUM: 9.1 mg/dL (ref 8.9–10.3)
CO2: 28 mmol/L (ref 22–32)
CREATININE: 0.93 mg/dL (ref 0.44–1.00)
Chloride: 106 mmol/L (ref 101–111)
Glucose, Bld: 106 mg/dL — ABNORMAL HIGH (ref 65–99)
Potassium: 4.1 mmol/L (ref 3.5–5.1)
Sodium: 142 mmol/L (ref 135–145)
TOTAL PROTEIN: 6.7 g/dL (ref 6.5–8.1)

## 2017-10-03 MED ORDER — MORPHINE SULFATE ER 30 MG PO TBCR: 30.0000 mg | EXTENDED_RELEASE_TABLET | Freq: Two times a day (BID) | ORAL | 0 refills | Status: AC

## 2017-10-03 MED ORDER — HEPARIN SOD (PORK) LOCK FLUSH 100 UNIT/ML IV SOLN
500.0000 [IU] | INTRAVENOUS | Status: AC | PRN
  Administered 2017-10-03: 500 [IU]

## 2017-10-03 MED ORDER — OXYCODONE HCL 20 MG PO TABS: 20.0000 mg | ORAL_TABLET | ORAL | 0 refills | Status: AC | PRN

## 2017-10-03 NOTE — Progress Notes (Signed)
Pt discharged. Awaiting pick up from family member. Line deaccessed, pts PCA Dilaudid wasted with Merchandiser, retail at 2.5 mls.

## 2017-10-03 NOTE — Discharge Summary (Signed)
Physician Discharge Summary  Patient ID: [name] MRN: [id number] DOB/AGE: [birth year] 25 y.o.  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 Has been using Cocaine which triggers her attacks. She has also not had any PCP and no pain medications at home. She was treated with IV Dilaudid PCA with toradol and IVF. Counseling provided once again regarding cocaine abuse. Hemoglobin was stable. No other issues. Patient was subsequently discharged home and given 15 days of MS contin and her Oxycodone. She is to be seen in the Sickle cell clinic as a new patient.  Consults: None  Significant Diagnostic Studies: labs: Serial CBCs and CMPs checked  Treatments: IV hydration and analgesia: acetaminophen and Dilaudid  Discharge Exam: Blood pressure 117/82, pulse 90, temperature 98.8 F (37.1 C), resp. rate 15, height 5\' 1"  (1.549 m), weight 86.2 kg (190 lb), last menstrual period 09/12/2017, SpO2 98 %, not currently breastfeeding. General appearance: alert, cooperative, appears stated age and no distress Neck: no adenopathy, no carotid bruit, no JVD, supple, symmetrical, trachea midline and thyroid not enlarged, symmetric, no tenderness/mass/nodules Cardio: regular rate and rhythm, S1, S2 normal, no murmur, click, rub or gallop GI: soft, non-tender; bowel sounds normal; no masses,  no organomegaly Extremities: extremities normal, atraumatic, no cyanosis or edema Pulses: 2+ and symmetric Neurologic: Grossly  normal  Disposition: Discharge disposition: 01-Home or Self Care       Discharge Instructions    Diet - low sodium heart healthy   Complete by:  As directed    Increase activity slowly   Complete by:  As directed      Allergies as of 10/03/2017      Reactions   Carrot [daucus Carota] Hives, Swelling   Allergic to carrots   Latex Rash      Medication List    TAKE these medications   aspirin-acetaminophen-caffeine 250-250-65 MG tablet Commonly known as:  EXCEDRIN MIGRAINE Take 2 tablets every 6 (six) hours as needed by mouth for headache.   cyclobenzaprine 10 MG tablet Commonly known as:  FLEXERIL Take 1 tablet (10 mg total) by mouth 3 (three) times daily as needed for muscle spasms.   folic acid 1 MG tablet Commonly known as:  FOLVITE Take 1 mg by mouth daily.   gabapentin 300 MG capsule Commonly known as:  NEURONTIN Take 1 capsule (300 mg total) by mouth 3 (three) times daily.   hydroxyurea 500 MG capsule Commonly known as:  HYDREA Take 2 tabs (1000 mg ) PO QOD starting on 06/02/2017 alternating with 1 tab (500 mg) PO QOD starting on 06/03/2017. May take with food to minimize GI side effects. What changed:    how much to take  how to take this  when to take this  additional instructions   medroxyPROGESTERone 150 MG/ML injection Commonly known as:  DEPO-PROVERA Inject 1 mL (150 mg total) into the muscle every 3 (three) months.   morphine 30 MG 12 hr tablet Commonly known as:  MS CONTIN Take 1 tablet (  30 mg total) by mouth every 12 (twelve) hours for 7 days.   Oxycodone HCl 20 MG Tabs Take 1 tablet (20 mg total) by mouth every 4 (four) hours as needed for up to 7 days (pain).        SignedBarbette Merino 10/03/2017, 1:33 PM   Time spent 35 minutes

## 2017-10-03 NOTE — Progress Notes (Signed)
CRITICAL VALUE ALERT  Critical Value:  Hgb 6.8  Date & Time Notied:  10/03/17 @ 0730  Provider Notified: Doreene Burke  Orders Received/Actions taken: Awaiting call back, no new orders at this time.

## 2017-10-05 ENCOUNTER — Other Ambulatory Visit: Payer: Self-pay

## 2017-10-05 ENCOUNTER — Encounter (HOSPITAL_COMMUNITY): Payer: Self-pay

## 2017-10-05 ENCOUNTER — Emergency Department (HOSPITAL_COMMUNITY): Payer: Medicare Other

## 2017-10-05 ENCOUNTER — Inpatient Hospital Stay (HOSPITAL_COMMUNITY)
Admission: EM | Admit: 2017-10-05 | Discharge: 2017-10-09 | DRG: 812 | Disposition: A | Discharge status: Home / Self Care | Payer: Medicare Other | Attending: Internal Medicine | Admitting: Internal Medicine

## 2017-10-05 ENCOUNTER — Encounter (HOSPITAL_COMMUNITY): Payer: Self-pay | Admitting: Emergency Medicine

## 2017-10-05 ENCOUNTER — Emergency Department (HOSPITAL_COMMUNITY)
Admission: EM | Admit: 2017-10-05 | Discharge: 2017-10-05 | Disposition: A | Discharge status: Home / Self Care | Payer: Medicare Other | Source: Home / Self Care | Attending: Emergency Medicine | Admitting: Emergency Medicine

## 2017-10-05 DIAGNOSIS — Z79899 Other long term (current) drug therapy: Secondary | ICD-10-CM

## 2017-10-05 DIAGNOSIS — R6510 Systemic inflammatory response syndrome (SIRS) of non-infectious origin without acute organ dysfunction: Secondary | ICD-10-CM | POA: Diagnosis present

## 2017-10-05 DIAGNOSIS — Z832 Family history of diseases of the blood and blood-forming organs and certain disorders involving the immune mechanism: Secondary | ICD-10-CM

## 2017-10-05 DIAGNOSIS — Z91040 Latex allergy status: Secondary | ICD-10-CM

## 2017-10-05 DIAGNOSIS — Z8249 Family history of ischemic heart disease and other diseases of the circulatory system: Secondary | ICD-10-CM | POA: Diagnosis not present

## 2017-10-05 DIAGNOSIS — D638 Anemia in other chronic diseases classified elsewhere: Secondary | ICD-10-CM | POA: Diagnosis present

## 2017-10-05 DIAGNOSIS — Z91018 Allergy to other foods: Secondary | ICD-10-CM

## 2017-10-05 DIAGNOSIS — M549 Dorsalgia, unspecified: Secondary | ICD-10-CM | POA: Diagnosis present

## 2017-10-05 DIAGNOSIS — D571 Sickle-cell disease without crisis: Secondary | ICD-10-CM | POA: Diagnosis not present

## 2017-10-05 DIAGNOSIS — Z833 Family history of diabetes mellitus: Secondary | ICD-10-CM

## 2017-10-05 DIAGNOSIS — Z9049 Acquired absence of other specified parts of digestive tract: Secondary | ICD-10-CM | POA: Diagnosis not present

## 2017-10-05 DIAGNOSIS — F633 Trichotillomania: Secondary | ICD-10-CM | POA: Diagnosis present

## 2017-10-05 DIAGNOSIS — Z86711 Personal history of pulmonary embolism: Secondary | ICD-10-CM | POA: Diagnosis not present

## 2017-10-05 DIAGNOSIS — D5700 Hb-SS disease with crisis, unspecified: Secondary | ICD-10-CM

## 2017-10-05 DIAGNOSIS — D57219 Sickle-cell/Hb-C disease with crisis, unspecified: Secondary | ICD-10-CM | POA: Insufficient documentation

## 2017-10-05 DIAGNOSIS — K219 Gastro-esophageal reflux disease without esophagitis: Secondary | ICD-10-CM | POA: Diagnosis present

## 2017-10-05 DIAGNOSIS — F17210 Nicotine dependence, cigarettes, uncomplicated: Secondary | ICD-10-CM | POA: Insufficient documentation

## 2017-10-05 DIAGNOSIS — R509 Fever, unspecified: Secondary | ICD-10-CM | POA: Diagnosis not present

## 2017-10-05 DIAGNOSIS — G8929 Other chronic pain: Secondary | ICD-10-CM | POA: Diagnosis present

## 2017-10-05 DIAGNOSIS — G894 Chronic pain syndrome: Secondary | ICD-10-CM

## 2017-10-05 DIAGNOSIS — R079 Chest pain, unspecified: Secondary | ICD-10-CM | POA: Diagnosis not present

## 2017-10-05 LAB — COMPREHENSIVE METABOLIC PANEL WITH GFR
ALT: 32 U/L (ref 14–54)
AST: 32 U/L (ref 15–41)
Albumin: 3.8 g/dL (ref 3.5–5.0)
Alkaline Phosphatase: 70 U/L (ref 38–126)
Anion gap: 7 (ref 5–15)
BUN: 7 mg/dL (ref 6–20)
CO2: 26 mmol/L (ref 22–32)
Calcium: 8.7 mg/dL — ABNORMAL LOW (ref 8.9–10.3)
Chloride: 106 mmol/L (ref 101–111)
Creatinine, Ser: 0.75 mg/dL (ref 0.44–1.00)
GFR calc Af Amer: >60 mL/min
GFR calc non Af Amer: >60 mL/min
Glucose, Bld: 92 mg/dL (ref 65–99)
Potassium: 3.9 mmol/L (ref 3.5–5.1)
Sodium: 139 mmol/L (ref 135–145)
Total Bilirubin: 2.1 mg/dL — ABNORMAL HIGH (ref 0.3–1.2)
Total Protein: 6.3 g/dL — ABNORMAL LOW (ref 6.5–8.1)

## 2017-10-05 LAB — CBC WITH DIFFERENTIAL/PLATELET
BASOS PCT: 1 %
Basophils Absolute: 0.1 10*3/uL (ref 0.0–0.1)
Basophils Absolute: 0.2 K/uL — ABNORMAL HIGH (ref 0.0–0.1)
Basophils Relative: 1 %
EOS ABS: 0.5 10*3/uL (ref 0.0–0.7)
EOS PCT: 3 %
Eosinophils Absolute: 0.2 K/uL (ref 0.0–0.7)
Eosinophils Relative: 1 %
HCT: 19.2 % — ABNORMAL LOW (ref 36.0–46.0)
HCT: 21.1 % — ABNORMAL LOW (ref 36.0–46.0)
Hemoglobin: 6.8 g/dL — CL (ref 12.0–15.0)
Hemoglobin: 7 g/dL — ABNORMAL LOW (ref 12.0–15.0)
Lymphocytes Relative: 17 %
Lymphocytes Relative: 4 %
Lymphs Abs: 2.8 10*3/uL (ref 0.7–4.0)
Lymphs Abs: 0.8 K/uL (ref 0.7–4.0)
MCH: 33 pg (ref 26.0–34.0)
MCH: 31.7 pg (ref 26.0–34.0)
MCHC: 35.4 g/dL (ref 30.0–36.0)
MCHC: 33.2 g/dL (ref 30.0–36.0)
MCV: 93.2 fL (ref 78.0–100.0)
MCV: 95.5 fL (ref 78.0–100.0)
Monocytes Absolute: 1.5 10*3/uL (ref 0.1–1.0)
Monocytes Absolute: 1.6 K/uL — ABNORMAL HIGH (ref 0.1–1.0)
Monocytes Relative: 9 %
Monocytes Relative: 8 %
NEUTROS PCT: 70 %
Neutro Abs: 11.1 10*3/uL (ref 1.7–7.7)
Neutro Abs: 16.8 K/uL — ABNORMAL HIGH (ref 1.7–7.7)
Neutrophils Relative %: 86 %
PLATELETS: 465 10*3/uL — AB (ref 150–400)
Platelets: 520 K/uL — ABNORMAL HIGH (ref 150–400)
RBC: 2.06 MIL/uL — ABNORMAL LOW (ref 3.87–5.11)
RBC: 2.21 MIL/uL — ABNORMAL LOW (ref 3.87–5.11)
RDW: 22.4 % — AB (ref 11.5–15.5)
RDW: 22.2 % — ABNORMAL HIGH (ref 11.5–15.5)
WBC: 16 10*3/uL — AB (ref 4.0–10.5)
WBC: 19.6 K/uL — ABNORMAL HIGH (ref 4.0–10.5)
nRBC: 14 /100 WBC — ABNORMAL HIGH

## 2017-10-05 LAB — RAPID URINE DRUG SCREEN, HOSP PERFORMED
Amphetamines: NOT DETECTED
Barbiturates: NOT DETECTED
Benzodiazepines: NOT DETECTED
COCAINE: NOT DETECTED
OPIATES: POSITIVE — AB
TETRAHYDROCANNABINOL: NOT DETECTED

## 2017-10-05 LAB — COMPREHENSIVE METABOLIC PANEL
ALK PHOS: 70 U/L (ref 38–126)
ALT: 26 U/L (ref 14–54)
AST: 28 U/L (ref 15–41)
Albumin: 3.8 g/dL (ref 3.5–5.0)
Anion gap: 9 (ref 5–15)
BUN: 10 mg/dL (ref 6–20)
CALCIUM: 8.7 mg/dL — AB (ref 8.9–10.3)
CO2: 25 mmol/L (ref 22–32)
CREATININE: 0.75 mg/dL (ref 0.44–1.00)
Chloride: 105 mmol/L (ref 101–111)
Glucose, Bld: 113 mg/dL — ABNORMAL HIGH (ref 65–99)
Potassium: 3.8 mmol/L (ref 3.5–5.1)
Sodium: 139 mmol/L (ref 135–145)
Total Bilirubin: 1.7 mg/dL — ABNORMAL HIGH (ref 0.3–1.2)
Total Protein: 6.5 g/dL (ref 6.5–8.1)

## 2017-10-05 LAB — URINALYSIS, ROUTINE W REFLEX MICROSCOPIC
Bilirubin Urine: NEGATIVE
Glucose, UA: NEGATIVE mg/dL
HGB URINE DIPSTICK: NEGATIVE
Ketones, ur: NEGATIVE mg/dL
NITRITE: NEGATIVE
PH: 7 (ref 5.0–8.0)
Protein, ur: NEGATIVE mg/dL
SPECIFIC GRAVITY, URINE: 1.008 (ref 1.005–1.030)

## 2017-10-05 LAB — RETICULOCYTES
RBC.: 2.06 MIL/uL — ABNORMAL LOW (ref 3.87–5.11)
RBC.: 2.21 MIL/uL — ABNORMAL LOW (ref 3.87–5.11)
RETIC COUNT ABSOLUTE: 450.8 10*3/uL — AB (ref 19.0–186.0)
Retic Ct Pct: >23 % — ABNORMAL HIGH (ref 0.4–3.1)
Retic Ct Pct: 20.4 % — ABNORMAL HIGH (ref 0.4–3.1)

## 2017-10-05 LAB — SEDIMENTATION RATE: Sed Rate: 6 mm/hr (ref 0–22)

## 2017-10-05 LAB — I-STAT VENOUS BLOOD GAS, ED
ACID-BASE EXCESS: 1 mmol/L (ref 0.0–2.0)
BICARBONATE: 26.8 mmol/L (ref 20.0–28.0)
O2 Saturation: 81 %
PH VEN: 7.371 (ref 7.250–7.430)
TCO2: 28 mmol/L (ref 22–32)
pCO2, Ven: 46.3 mmHg (ref 44.0–60.0)
pO2, Ven: 47 mmHg — ABNORMAL HIGH (ref 32.0–45.0)

## 2017-10-05 LAB — I-STAT CG4 LACTIC ACID, ED
LACTIC ACID, VENOUS: 1.21 mmol/L (ref 0.5–1.9)
LACTIC ACID, VENOUS: 1.11 mmol/L (ref 0.5–1.9)

## 2017-10-05 LAB — I-STAT BETA HCG BLOOD, ED (MC, WL, AP ONLY)
I-stat hCG, quantitative: <5 m[IU]/mL (ref <5)
I-stat hCG, quantitative: <5 m[IU]/mL

## 2017-10-05 LAB — INFLUENZA PANEL BY PCR (TYPE A & B)
INFLBPCR: NEGATIVE
Influenza A By PCR: NEGATIVE

## 2017-10-05 MED ORDER — HYDROMORPHONE HCL 2 MG/ML IJ SOLN: 2.0000 mg | INTRAMUSCULAR | Status: AC

## 2017-10-05 MED ORDER — HYDROMORPHONE HCL 2 MG/ML IJ SOLN
2.0000 mg | INTRAMUSCULAR | Status: AC
  Administered 2017-10-05: 2 mg via INTRAVENOUS
  Filled 2017-10-05: qty 1

## 2017-10-05 MED ORDER — SODIUM CHLORIDE 0.45 % IV SOLN
INTRAVENOUS | Status: DC
  Administered 2017-10-05: 17:00:00 via INTRAVENOUS

## 2017-10-05 MED ORDER — DIPHENHYDRAMINE HCL 50 MG/ML IJ SOLN
25.0000 mg | Freq: Once | INTRAMUSCULAR | Status: AC
  Administered 2017-10-05: 25 mg via INTRAVENOUS
  Filled 2017-10-05: qty 1

## 2017-10-05 MED ORDER — KETOROLAC TROMETHAMINE 30 MG/ML IJ SOLN
30.0000 mg | INTRAMUSCULAR | Status: AC
  Administered 2017-10-05: 30 mg via INTRAVENOUS
  Filled 2017-10-05: qty 1

## 2017-10-05 MED ORDER — ONDANSETRON HCL 4 MG/2ML IJ SOLN: 4.0000 mg | INTRAMUSCULAR | Status: DC | PRN

## 2017-10-05 MED ORDER — SODIUM CHLORIDE 0.9 % IV SOLN
2.0000 g | Freq: Once | INTRAVENOUS | Status: AC
  Administered 2017-10-05: 2 g via INTRAVENOUS
  Filled 2017-10-05: qty 20

## 2017-10-05 MED ORDER — SODIUM CHLORIDE 0.9 % IV BOLUS (SEPSIS)
500.0000 mL | Freq: Once | INTRAVENOUS | Status: AC
  Administered 2017-10-05: 500 mL via INTRAVENOUS

## 2017-10-05 MED ORDER — DEXTROSE-NACL 5-0.45 % IV SOLN
INTRAVENOUS | Status: DC
  Administered 2017-10-05: 08:00:00 via INTRAVENOUS

## 2017-10-05 MED ORDER — ONDANSETRON HCL 4 MG/2ML IJ SOLN
4.0000 mg | INTRAMUSCULAR | Status: DC | PRN
  Administered 2017-10-05: 4 mg via INTRAVENOUS
  Filled 2017-10-05: qty 2

## 2017-10-05 MED ORDER — HYDROMORPHONE HCL 2 MG/ML IJ SOLN
2.0000 mg | Freq: Once | INTRAMUSCULAR | Status: AC
  Administered 2017-10-06: 2 mg via INTRAVENOUS
  Filled 2017-10-05: qty 1

## 2017-10-05 MED ORDER — KETOROLAC TROMETHAMINE 15 MG/ML IJ SOLN
15.0000 mg | INTRAMUSCULAR | Status: AC
  Administered 2017-10-05: 15 mg via INTRAVENOUS
  Filled 2017-10-05: qty 1

## 2017-10-05 MED ORDER — SODIUM CHLORIDE 0.9 % IV BOLUS (SEPSIS)
1000.0000 mL | Freq: Once | INTRAVENOUS | Status: AC
  Administered 2017-10-05: 1000 mL via INTRAVENOUS

## 2017-10-05 MED ORDER — HEPARIN SOD (PORK) LOCK FLUSH 100 UNIT/ML IV SOLN
500.0000 [IU] | Freq: Once | INTRAVENOUS | Status: AC
Start: 2017-10-05 — End: 2017-10-05
  Administered 2017-10-05: 500 [IU]
  Filled 2017-10-05: qty 5

## 2017-10-05 MED ORDER — SODIUM CHLORIDE 0.9 % IV SOLN
500.0000 mg | Freq: Once | INTRAVENOUS | Status: AC
  Administered 2017-10-05: 500 mg via INTRAVENOUS
  Filled 2017-10-05: qty 500

## 2017-10-05 MED ORDER — HYDROMORPHONE HCL 2 MG/ML IJ SOLN
2.0000 mg | INTRAMUSCULAR | Status: AC
  Administered 2017-10-05 (×2): 2 mg via INTRAVENOUS
  Filled 2017-10-05 (×2): qty 1

## 2017-10-05 NOTE — ED Notes (Signed)
Pt refusing to allow DC vital signs.  Pt demanding to be admitted, states " PO opiates don't work and I'm in pain, I've had a stroke, I need meds"  Dr Tamera Punt aware and spoke to Dr Jonelle Sidle who advised pt was displaying pain med seeking behavior and would not be admitted.  Tammy told the patient to go home and at least try her po meds and fluids.  Pt very upset saying she was going to sue [place] all and we would be hearing from her lawyer for making her be discharged.

## 2017-10-05 NOTE — ED Notes (Signed)
Pt was seen by admitting (dr. Raliegh Ip) and I am notified that pt will be transferred to [facility]

## 2017-10-05 NOTE — ED Provider Notes (Signed)
[place] EMERGENCY DEPARTMENT Provider Note   CSN: [id number] Arrival date & time: 10/05/17  1537     History   Chief Complaint Chief Complaint  Patient presents with  . Sickle Cell Pain Crisis    HPI [name] is a 27 y.o. female.  HPI   She presents for evaluation of pain which is typical of her usual crisis, by her report.  She describes the discomfort has pain in her bilateral groin, left hip and right foot.  She was treated earlier today at [facility], left there under duress, and immediately came here.  During the early evaluation she was treated empirically with multiple doses of narcotic analgesia, and had a comprehensive evaluation.  Patient was recently admitted to the hospital, and discharged on high-dose daily narcotic pain medication.  She states she does not currently have a primary care provider.  She has sickle cell disease, and chronic pain.  She is known to use cocaine.  She states that she is too sick to go home and take care of her young children.  She came here today by private vehicle.  She denies cough, nausea, vomiting, chest pain, back pain, weakness or paresthesia.  She has not had any dysuria, urinary frequency or vaginal discharge.  There are no other known modifying factors.  Past Medical History:  Diagnosis Date  . Blood transfusion    "lots"  . Blood transfusion without reported diagnosis   . Chronic back pain    "very severe; have knot in my back; from tight muscle; take RX and exercise for it"  . Depression 01/06/2011  . Exertional dyspnea    "sometimes"  . Genital HSV   . GERD (gastroesophageal reflux disease) 02/17/2011  . Migraines 11/08/11   "@ least twice/month"  . 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  . Mood swings 11/08/11   "I go back and forth; real bad"  . Sickle cell anemia ([name])   . Sickle cell anemia with crisis ([place])   . Thrombocytosis ([place]) 11/09/2015  . Trichotillomania    h/o     Patient Active Problem List   Diagnosis Date Noted  . 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 Chest pain 09/29/2017  . Sickle cell crisis ([place]) 09/29/2017  . Hb-SS disease with crisis ([place]) 08/20/2016  . Chronic anticoagulation   . Sickle cell anemia ([place]) 05/19/2016  . History of DVT (deep vein thrombosis) 04/17/2016  . Thrombocytosis ([place]) 11/09/2015  . Hyperbilirubinemia 11/09/2015  . Chronic pain 08/04/2015  . Herpes simplex 07/14/2015  . Anemia of chronic disease   . Sickle cell pain crisis ([place]) 03/29/2012  . 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 Hb-SS disease without crisis ([place]) 1991/06/20    Past Surgical History:  Procedure Laterality Date  . CHOLECYSTECTOMY  05/2010  . DILATION AND CURETTAGE OF UTERUS  02/20/11   S/P miscarriage  . IR GENERIC HISTORICAL  12/23/2015   IR FLUORO GUIDE CV LINE RIGHT 12/23/2015 Jacqulynn Cadet, MD [facility]-INTERV RAD  . IR GENERIC HISTORICAL  12/23/2015   IR US GUIDE VASC ACCESS RIGHT 12/23/2015 Jacqulynn Cadet, MD [facility]-INTERV RAD     OB History    Gravida  5   Para  2   Term  2   Preterm  0   AB  3   Living  2     SAB  3   TAB  0   Ectopic  0   Multiple  0   Live Births  2        Obstetric Comments  Miscarried in October 2012 at about 7 weeks         Home Medications    Prior to Admission medications   Medication Sig Start Date End Date Taking? Authorizing Provider  aspirin-acetaminophen-caffeine (EXCEDRIN MIGRAINE) [phone] MG tablet Take 2 tablets every 6 (six) hours as needed by mouth for headache.   Yes [provider]  folic acid (FOLVITE) 1 MG tablet Take 1 mg by mouth daily.   Yes [provider]  hydroxyurea (HYDREA) 500 MG capsule Take 2 tabs (1000 mg ) PO QOD starting on 06/02/2017 alternating with 1 tab (500 mg) PO QOD starting on 06/03/2017. May take with food to minimize GI side effects. Patient taking differently: Take 1,500 mg by mouth daily. May take with  food to minimize GI side effects. 07/24/17  Yes Scot Jun, FNP  morphine (MS CONTIN) 30 MG 12 hr tablet Take 1 tablet (30 mg total) by mouth every 12 (twelve) hours for 7 days. 10/03/17 10/10/17 Yes Elwyn Reach, MD  Oxycodone HCl 20 MG TABS Take 1 tablet (20 mg total) by mouth every 4 (four) hours as needed for up to 7 days (pain). 10/03/17 10/10/17 Yes Elwyn Reach, MD  cyclobenzaprine (FLEXERIL) 10 MG tablet Take 1 tablet (10 mg total) by mouth 3 (three) times daily as needed for muscle spasms. Patient not taking: Reported on 09/29/2017 07/24/17   Scot Jun, FNP  gabapentin (NEURONTIN) 300 MG capsule Take 1 capsule (300 mg total) by mouth 3 (three) times daily. Patient not taking: Reported on 09/29/2017 07/24/17   Scot Jun, FNP    Family History Family History  Problem Relation Age of Onset  . Sickle cell trait Mother   . Sickle cell trait Father   . Diabetes Maternal Grandmother   . Diabetes Paternal Grandmother   . Hypertension Paternal Grandmother   . Diabetes Maternal Grandfather     Social History Social History   Tobacco Use  . Smoking status: Current Some Day Smoker    Packs/day: 0.25    Years: 1.00    Pack years: 0.25    Types: Cigarettes    Last attempt to quit: 03/25/2013    Years since quitting: 4.5  . Smokeless tobacco: Never Used  Substance Use Topics  . Alcohol use: No    Comment: denies  . Drug use: No    Types: Other-see comments    Comment: "Clean for 3 months"     Allergies   Carrot [daucus carota] and Latex   Review of Systems Review of Systems  All other systems reviewed and are negative.    Physical Exam Updated Vital Signs BP (!) 103/54   Pulse (!) 106   Temp 100.3 F (37.9 C) (Rectal)   Resp 18   Ht 5\' 1"  (1.549 m)   Wt 86.2 kg (190 lb)   LMP 09/12/2017 (Approximate)   SpO2 99%   BMI 35.90 kg/m   Physical Exam  Constitutional: She is oriented to person, place, and time. She appears well-developed.  She appears distressed (Uncomfortable, tearful).  Overweight  HENT:  Head: Normocephalic and atraumatic.  Eyes: Pupils are equal, round, and reactive to light. Conjunctivae and EOM are normal.  Neck: Normal range of motion and phonation normal. Neck supple.  Cardiovascular: Regular rhythm.  Tachycardic  Pulmonary/Chest: Effort normal and breath sounds normal. No stridor. No respiratory distress. She has  no wheezes. She has no rales. She exhibits no tenderness.  Tachypneic  Abdominal: Soft. She exhibits no distension. There is no tenderness. There is no guarding.  Musculoskeletal: Normal range of motion. She exhibits no edema.  Pain bilateral groin with movement, and to light touch.  Pain with palpation, right foot with questionable swelling, diffusely.  No tenderness or swelling of the left knee, left ankle, right knee.  Normal range of motion both arms.  Neurological: She is alert and oriented to person, place, and time. She exhibits normal muscle tone.  Skin: Skin is warm and dry. No rash noted. No erythema. No pallor.  Psychiatric: Her behavior is normal. Judgment and thought content normal.  Tearful  Nursing note and vitals reviewed.    ED Treatments / Results  Labs (all labs ordered are listed, but only abnormal results are displayed) Labs Reviewed  COMPREHENSIVE METABOLIC PANEL - Abnormal; Notable for the following components:      Result Value   Calcium 8.7 (*)    Total Protein 6.3 (*)    Total Bilirubin 2.1 (*)    All other components within normal limits  CBC WITH DIFFERENTIAL/PLATELET - Abnormal; Notable for the following components:   WBC 19.6 (*)    RBC 2.21 (*)    Hemoglobin 7.0 (*)    HCT 21.1 (*)    RDW 22.2 (*)    Platelets 520 (*)    Neutro Abs 16.8 (*)    Monocytes Absolute 1.6 (*)    Basophils Absolute 0.2 (*)    All other components within normal limits  RETICULOCYTES - Abnormal; Notable for the following components:   Retic Ct Pct 20.4 (*)    RBC. 2.21  (*)    Retic Count, Absolute 450.8 (*)    All other components within normal limits  URINALYSIS, ROUTINE W REFLEX MICROSCOPIC - Abnormal; Notable for the following components:   Leukocytes, UA MODERATE (*)    Bacteria, UA RARE (*)    All other components within normal limits  I-STAT VENOUS BLOOD GAS, ED - Abnormal; Notable for the following components:   pO2, Ven 47.0 (*)    All other components within normal limits  CULTURE, BLOOD (ROUTINE X 2)  CULTURE, BLOOD (ROUTINE X 2)  URINE CULTURE  INFLUENZA PANEL BY PCR (TYPE A & B)  SEDIMENTATION RATE  BLOOD GAS, VENOUS  RAPID URINE DRUG SCREEN, HOSP PERFORMED  I-STAT BETA HCG BLOOD, ED ([facility], [facility], [facility] ONLY)  POC URINE PREG, ED  I-STAT CG4 LACTIC ACID, ED  I-STAT CG4 LACTIC ACID, ED    EKG None  Radiology Dg Chest 2 View  Result Date: 10/05/2017 CLINICAL DATA:  Fever for 1 day, history of sickle cell anemia, smoking history EXAM: CHEST - 2 VIEW COMPARISON:  Chest x-ray of 10/05/2016 FINDINGS: No active infiltrate or effusion is seen. Right-sided Port-A-Cath remains with the tip overlying mid SVC. Mediastinal and hilar contours are unremarkable and the heart is mildly enlarged. 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 IMPRESSION: 1. No active lung disease. 2. Port-A-Cath tip overlies the mid SVC. 3. 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 Electronically Signed   By: Ivar Drape M.D.   On: 10/05/2017 17:09   Dg Chest 2 View  Result Date: 10/05/2017 CLINICAL DATA:  Sickle cell crisis. EXAM: CHEST - 2 VIEW COMPARISON:  09/29/2017. FINDINGS: PowerPort catheter with tip over the superior vena cava. Cardiomegaly with mild pulmonary vascular prominence. No focal  infiltrate. Low lung volumes. No pleural effusion or pneumothorax. IMPRESSION: 1.  PowerPort catheter with tip over the superior vena cava. 2. Cardiomegaly with mild pulmonary vascular prominence. Mild right mid lung field  subsegmental atelectasis. Electronically Signed   By: Marcello Moores  Register   On: 10/05/2017 09:20    Procedures .Critical Care Performed by: Daleen Bo, MD Authorized by: Daleen Bo, MD   Critical care provider statement:    Critical care time (minutes):  45   Critical care start time:  10/05/2017 5:21 PM   Critical care end time:  10/05/2017 10:47 PM   Critical care time was exclusive of:  Separately billable procedures and treating other patients   Critical care was necessary to treat or prevent imminent or life-threatening deterioration of the following conditions: Sickle cell crisis.   Critical care was time spent personally by me on the following activities:  Blood draw for specimens, development of treatment plan with patient or surrogate, discussions with consultants, evaluation of patient's response to treatment, examination of patient, obtaining history from patient or surrogate, ordering and performing treatments and interventions, ordering and review of laboratory studies, pulse oximetry, re-evaluation of patient's condition, review of old charts and ordering and review of radiographic studies   (including critical care time)  Medications Ordered in ED Medications  0.45 % sodium chloride infusion ( Intravenous New Bag/Given 10/05/17 1645)  ondansetron (ZOFRAN) injection 4 mg (4 mg Intravenous Given 10/05/17 1646)  ketorolac (TORADOL) 15 MG/ML injection 15 mg (15 mg Intravenous Given 10/05/17 1645)  HYDROmorphone (DILAUDID) injection 2 mg (2 mg Intravenous Given 10/05/17 1646)    Or  HYDROmorphone (DILAUDID) injection 2 mg ( Subcutaneous See Alternative 10/05/17 1646)  HYDROmorphone (DILAUDID) injection 2 mg (2 mg Intravenous Given 10/05/17 1712)    Or  HYDROmorphone (DILAUDID) injection 2 mg ( Subcutaneous See Alternative 10/05/17 1712)  HYDROmorphone (DILAUDID) injection 2 mg (2 mg Intravenous Given 10/05/17 1826)    Or  HYDROmorphone (DILAUDID) injection 2 mg ( Subcutaneous See  Alternative 10/05/17 1826)  diphenhydrAMINE (BENADRYL) injection 25 mg (25 mg Intravenous Given 10/05/17 1646)  sodium chloride 0.9 % bolus 1,000 mL (0 mLs Intravenous Stopped 10/05/17 1922)    And  sodium chloride 0.9 % bolus 500 mL (0 mLs Intravenous Stopped 10/05/17 2025)  cefTRIAXone (ROCEPHIN) 2 g in sodium chloride 0.9 % 100 mL IVPB (0 g Intravenous Stopped 10/05/17 1918)  azithromycin (ZITHROMAX) 500 mg in sodium chloride 0.9 % 250 mL IVPB (0 mg Intravenous Stopped 10/05/17 2025)     Initial Impression / Assessment and Plan / ED Course  I have reviewed the triage vital signs and the nursing notes.  Pertinent labs & imaging results that were available during my care of the patient were reviewed by me and considered in my medical decision making (see chart for details).      Patient Vitals for the past 24 hrs:  BP Temp Temp src Pulse Resp SpO2 Height Weight  10/05/17 2158 (!) 103/54 - - (!) 106 18 99 % - -  10/05/17 2025 (!) 102/53 - - (!) 105 18 100 % - -  10/05/17 1921 (!) 109/51 - - 93 14 100 % - -  10/05/17 1833 - 100.3 F (37.9 C) Rectal - - - - -  10/05/17 1824 102/60 - - (!) 105 (!) 21 97 % - -  10/05/17 1741 119/75 - - 92 18 100 % - -  10/05/17 1546 125/79 (!) 100.6 F (38.1 C) Oral (!) 115 (!) 22  100 % 5\' 1"  (1.549 m) 86.2 kg (190 lb)    10:43 PM Reevaluation with update and discussion. After initial assessment and treatment, an updated evaluation reveals she is uncomfortable and states that she cannot go home, because she will "have to come right back."  She states that she has about 20 oxycodone tablets left.  This does not fit with the number recently prescribed.  Findings discussed with patient and all questions were answered. Daleen Bo   Medical Decision Making: Sickle cell anemia, with chronic pain.  Patient with second visit today, initially concerning for acute infection, possibly pneumonia.  Sirs criteria present on arrival.  Chest x-ray does not indicate  pneumonia.  Lactate negative.  Hemoglobin stable.  Reticulocyte count stable.  Patient has continued pain without objective criteria for significant hemolysis.  Review of records in epic indicate that the patient has had behavior indicative of narcotic diversion.  Care providers at [facility] [place], are concerned that prescriptions have been forged in their name.  The patient does not have an active primary care provider since her provider lost his prescription privileges.  CRITICAL CARE-yes Performed by: Daleen Bo  Nursing Notes Reviewed/ Care Coordinated Applicable Imaging Reviewed Interpretation of Laboratory Data incorporated into ED treatment   10:26 PM-Consult complete with hospitalist. Patient case explained and discussed.  He agrees to admit patient for further evaluation and treatment. Call ended at 10:38 PM  : Admit  Final Clinical Impressions(s) / ED Diagnoses   Final diagnoses:  None    ED Discharge Orders    None       Daleen Bo, MD 10/05/17 2248

## 2017-10-05 NOTE — ED Provider Notes (Addendum)
[facility] DEPT Provider Note   CSN: [id number] Arrival date & time: 10/05/17  0700     History   Chief Complaint Chief Complaint  Patient presents with  . Sickle Cell Pain Crisis    HPI [name] is a 27 y.o. female.  Patient is a 27 year old female with a history of sickle cell anemia who presents with a pain crises.  She was recently admitted 2 days ago for similar symptoms and discharged yesterday.  She states she is having ongoing pain, mostly to her lower extremities including her feet and her hips, mostly her right hip.  She also has pain in her right shoulder.  She states this is typical pain for her.  She states she typically has pain in her right foot that radiates to her right hip.  She denies any leg swelling.  She does have a history of DVT and was on Xarelto for about 18 months but is no longer on it.  She denies any fevers.  She does report some chest pain to the center of her chest that radiates around both sides.  No shortness of breath.  No known fevers.  She has taken her typical home medications without improvement in symptoms.  She was previously being seen at the [facility] hematology clinic but tested positive for cocaine and they would not see her anymore.  She is awaiting establishment with the sickle cell clinic here in [place].  She did see Dr. Doreene Burke when she was in the hospital recently but will not be able to initiate care there until June 21.  She was given a prescription for oral pain medications from this recent hospitalization which she thinks will last her until she is seen in the clinic in June.     Past Medical History:  Diagnosis Date  . Blood transfusion    "lots"  . Blood transfusion without reported diagnosis   . Chronic back pain    "very severe; have knot in my back; from tight muscle; take RX and exercise for it"  . Depression 01/06/2011  . Exertional dyspnea    "sometimes"  . Genital HSV   . GERD  (gastroesophageal reflux disease) 02/17/2011  . Migraines 11/08/11   "@ least twice/month"  . 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  . Mood swings 11/08/11   "I go back and forth; real bad"  . Sickle cell anemia ([name])   . Sickle cell anemia with crisis ([place])   . Thrombocytosis ([place]) 11/09/2015  . Trichotillomania    h/o    Patient Active Problem List   Diagnosis Date Noted  . 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 Chest pain 09/29/2017  . Sickle cell crisis ([place]) 09/29/2017  . 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 Chronic anticoagulation   . Sickle cell anemia ([place]) 05/19/2016  . History of DVT (deep vein thrombosis) 04/17/2016  . Thrombocytosis ([place]) 11/09/2015  . Hyperbilirubinemia 11/09/2015  . Chronic pain 08/04/2015  . Herpes simplex 07/14/2015  . Anemia of chronic disease   . Sickle cell pain crisis ([place]) 03/29/2012  . 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 CHOLECYSTECTOMY  05/2010  . DILATION AND CURETTAGE OF UTERUS  02/20/11   S/P miscarriage  . IR GENERIC HISTORICAL  12/23/2015   IR FLUORO GUIDE CV LINE RIGHT 12/23/2015 Jacqulynn Cadet, MD [facility]  .  IR GENERIC HISTORICAL  12/23/2015   IR US GUIDE VASC ACCESS RIGHT 12/23/2015 Jacqulynn Cadet, MD [facility]     OB History    Gravida  5   Para  2   Term  2   Preterm  0   AB  3   Living  2     SAB  3   TAB  0   Ectopic  0   Multiple  0   Live Births  2        Obstetric Comments  Miscarried in October 2012 at about 7 weeks         Home Medications    Prior to Admission medications   Medication Sig Start Date End Date Taking? Authorizing Provider  aspirin-acetaminophen-caffeine (EXCEDRIN MIGRAINE) [phone] MG tablet Take 2 tablets every 6 (six) hours as needed by mouth for headache.   Yes [provider]  folic acid (FOLVITE)  1 MG tablet Take 1 mg by mouth daily.   Yes [provider]  hydroxyurea (HYDREA) 500 MG capsule Take 2 tabs (1000 mg ) PO QOD starting on 06/02/2017 alternating with 1 tab (500 mg) PO QOD starting on 06/03/2017. May take with food to minimize GI side effects. Patient taking differently: Take 1,500 mg by mouth daily. May take with food to minimize GI side effects. 07/24/17  Yes Scot Jun, FNP  morphine (MS CONTIN) 30 MG 12 hr tablet Take 1 tablet (30 mg total) by mouth every 12 (twelve) hours for 7 days. 10/03/17 10/10/17 Yes Elwyn Reach, MD  Oxycodone HCl 20 MG TABS Take 1 tablet (20 mg total) by mouth every 4 (four) hours as needed for up to 7 days (pain). 10/03/17 10/10/17 Yes Elwyn Reach, MD  cyclobenzaprine (FLEXERIL) 10 MG tablet Take 1 tablet (10 mg total) by mouth 3 (three) times daily as needed for muscle spasms. Patient not taking: Reported on 09/29/2017 07/24/17   Scot Jun, FNP  gabapentin (NEURONTIN) 300 MG capsule Take 1 capsule (300 mg total) by mouth 3 (three) times daily. Patient not taking: Reported on 09/29/2017 07/24/17   Scot Jun, FNP    Family History Family History  Problem Relation Age of Onset  . Sickle cell trait Mother   . Sickle cell trait Father   . Diabetes Maternal Grandmother   . Diabetes Paternal Grandmother   . Hypertension Paternal Grandmother   . Diabetes Maternal Grandfather     Social History Social History   Tobacco Use  . Smoking status: Current Some Day Smoker    Packs/day: 0.25    Years: 1.00    Pack years: 0.25    Types: Cigarettes    Last attempt to quit: 03/25/2013    Years since quitting: 4.5  . Smokeless tobacco: Never Used  Substance Use Topics  . Alcohol use: No    Comment: denies  . Drug use: No    Types: Other-see comments    Comment: "Clean for 3 months"     Allergies   Carrot [daucus carota] and Latex   Review of Systems Review of Systems  Constitutional: Negative for chills,  diaphoresis, fatigue and fever.  HENT: Negative for congestion, rhinorrhea and sneezing.   Eyes: Negative.   Respiratory: Negative for cough, chest tightness and shortness of breath.   Cardiovascular: Positive for chest pain. Negative for leg swelling.  Gastrointestinal: Negative for abdominal pain, blood in stool, diarrhea, nausea and vomiting.  Genitourinary: Negative for difficulty urinating, flank pain,  frequency and hematuria.  Musculoskeletal: Positive for arthralgias. Negative for back pain.  Skin: Negative for rash.  Neurological: Negative for dizziness, speech difficulty, weakness, numbness and headaches.     Physical Exam Updated Vital Signs BP (!) 93/26   Pulse 84   Temp 98.1 F (36.7 C) (Oral)   Resp 12   Ht 5\' 1"  (1.549 m)   Wt 86.2 kg (190 lb)   LMP 09/12/2017 (Approximate)   SpO2 98%   BMI 35.90 kg/m   Physical Exam  Constitutional: She is oriented to person, place, and time. She appears well-developed and well-nourished.  HENT:  Head: Normocephalic and atraumatic.  Eyes: Pupils are equal, round, and reactive to light.  Neck: Normal range of motion. Neck supple.  Cardiovascular: Normal rate, regular rhythm and normal heart sounds.  Pulmonary/Chest: Effort normal and breath sounds normal. No respiratory distress. She has no wheezes. She has no rales. She exhibits tenderness (Positive tenderness over the sternum).  Abdominal: Soft. Bowel sounds are normal. There is no tenderness. There is no rebound and no guarding.  Musculoskeletal: Normal range of motion. She exhibits no edema.  She has pain on palpation and range of motion of her joints, primarily her right shoulder and right lower extremity.  There is no swelling of the joints.,  Particularly no swelling of the lower extremities.  No calf tenderness.  Pedal pulses are intact.  No warmth or erythema of the joints.  Lymphadenopathy:    She has no cervical adenopathy.  Neurological: She is alert and oriented to  person, place, and time.  Skin: Skin is warm and dry. No rash noted.  Psychiatric: She has a normal mood and affect.     ED Treatments / Results  Labs (all labs ordered are listed, but only abnormal results are displayed) Labs Reviewed  COMPREHENSIVE METABOLIC PANEL - Abnormal; Notable for the following components:      Result Value   Glucose, Bld 113 (*)    Calcium 8.7 (*)    Total Bilirubin 1.7 (*)    All other components within normal limits  CBC WITH DIFFERENTIAL/PLATELET - Abnormal; Notable for the following components:   WBC 16.0 (*)    RBC 2.06 (*)    Hemoglobin 6.8 (*)    HCT 19.2 (*)    RDW 22.4 (*)    Platelets 465 (*)    nRBC 14 (*)    All other components within normal limits  RETICULOCYTES - Abnormal; Notable for the following components:   Retic Ct Pct >23.0 (*)    RBC. 2.06 (*)    All other components within normal limits  I-STAT BETA HCG BLOOD, ED ([facility], [facility], [facility] ONLY)    EKG EKG Interpretation  Date/Time:  Thursday Oct 05 2017 07:17:53 EDT Ventricular Rate:  86 PR Interval:    QRS Duration: 95 QT Interval:  389 QTC Calculation: 466 R Axis:   67 Text Interpretation:  Sinus rhythm since last tracing no significant change Confirmed by Malvin Johns [phone]) on 10/05/2017 10:31:55 AM   Radiology Dg Chest 2 View  Result Date: 10/05/2017 CLINICAL DATA:  Sickle cell crisis. EXAM: CHEST - 2 VIEW COMPARISON:  09/29/2017. FINDINGS: PowerPort catheter with tip over the superior vena cava. Cardiomegaly with mild pulmonary vascular prominence. No focal infiltrate. Low lung volumes. No pleural effusion or pneumothorax. IMPRESSION: 1.  PowerPort catheter with tip over the superior vena cava. 2. Cardiomegaly with mild pulmonary vascular prominence. Mild right mid lung field subsegmental atelectasis. Electronically Signed  By: [place]   On: 10/05/2017 09:20    Procedures Procedures (including critical care time)  Medications Ordered in ED Medications    ondansetron (ZOFRAN) injection 4 mg (has no administration in time range)  dextrose 5 %-0.45 % sodium chloride infusion ( Intravenous New Bag/Given 10/05/17 0809)  ketorolac (TORADOL) 30 MG/ML injection 30 mg (30 mg Intravenous Given 10/05/17 0809)  HYDROmorphone (DILAUDID) injection 2 mg (2 mg Intravenous Given 10/05/17 0809)    Or  HYDROmorphone (DILAUDID) injection 2 mg ( Subcutaneous See Alternative 10/05/17 0809)  HYDROmorphone (DILAUDID) injection 2 mg (2 mg Intravenous Given 10/05/17 0858)    Or  HYDROmorphone (DILAUDID) injection 2 mg ( Subcutaneous See Alternative 10/05/17 0858)  HYDROmorphone (DILAUDID) injection 2 mg (2 mg Intravenous Given 10/05/17 1007)    Or  HYDROmorphone (DILAUDID) injection 2 mg ( Subcutaneous See Alternative 10/05/17 1007)  HYDROmorphone (DILAUDID) injection 2 mg (2 mg Intravenous Given 10/05/17 1143)    Or  HYDROmorphone (DILAUDID) injection 2 mg ( Subcutaneous See Alternative 10/05/17 1143)  diphenhydrAMINE (BENADRYL) injection 25 mg (25 mg Intravenous Given 10/05/17 0808)     Initial Impression / Assessment and Plan / ED Course  I have reviewed the triage vital signs and the nursing notes.  Pertinent labs & imaging results that were available during my care of the patient were reviewed by me and considered in my medical decision making (see chart for details).     Patient is a 27 year old female who presents with pain which she states is related to her sickle cell pain crises.  Similar to her prior pain crises.  She was given the full protocol dose of Dilaudid as well as Toradol and IV fluids.  Her labs are reviewed and similar to baseline values.  Her chest x-ray is clear without evidence of acute chest syndrome.  She has no hypoxia.  No persistent tachycardia.  No other concerning findings for DVT or PE.  She does not report relief from her pain with these treatments.  I spoke with Dr. Jonelle Sidle with the sickle cell team who does not feel that patient needs to be  admitted as an inpatient.  He states that he just admitted her and she exhibited pain seeking type behavior while she was in the hospital.  He gave her a prescription for her normal pain medication 2 days ago.  He did not feel that any further treatment need to be done.  Patient became very upset when I advised her that she was being discharged.  She started cursing and telling me that were not doing anything for her.  I did offer her a dose of her oral pain medication which she is refusing.  She was discharged and instructed to follow-up with a sickle cell clinic.  Pt still not allowing [place] to de-access her port and continuing to be very upset, demanding to speak to Dr. Jonelle Sidle.  I contacted Dr. Jonelle Sidle who feels that pt should still be discharged.  I explained this to the patient and she did eventually allow [place] to deaccess her port.  In addition, [employer] in the ED states that pt has a current court date for a recent arrest for selling her opioid pills.  Final Clinical Impressions(s) / ED Diagnoses   Final diagnoses:  Sickle cell pain crisis [facility])    ED Discharge Orders    None       Malvin Johns, MD 10/05/17 1353    Malvin Johns, MD 10/05/17 [phone]

## 2017-10-05 NOTE — ED Notes (Signed)
AC Tammy and Abigail Butts AD currently at bedside speaking with patient.

## 2017-10-05 NOTE — ED Triage Notes (Signed)
Sickle cell crisis pain for 4 hours PTA to ER states this is her typical sickle cell pain crying in triage.

## 2017-10-05 NOTE — ED Triage Notes (Signed)
Pt reports recently being discharged, left early. Seen at [facility] for sickle cell pain crisis, reports they gave her the protocol medicine but her pain was controlled, she was discharged. Pt came here due to pain. Pt tearful during exam. Pt febrile

## 2017-10-05 NOTE — ED Provider Notes (Signed)
Patient placed in Quick Look pathway, seen and evaluated   Chief Complaint: fever and sickle cell pain  HPI: Pt complains of severe pain  ROS: fever, myalgias  Physical Exam:   Gen: No distress  Neuro: Awake and Alert  Skin: Warm    Focused Exam: Tachycardia,  (limited screening, protocol by Rn and pt carried to room for assessment  Initiation of care has begun. The patient has been counseled on the process, plan, and necessity for staying for the completion/evaluation, and the remainder of the medical screening examination   Sidney Ace 10/05/17 1734    Julianne Rice, MD 10/06/17 [phone]

## 2017-10-05 NOTE — ED Notes (Signed)
Patient was very rude towards myself and the Dr. Tamera Punt, as I went into room to check on patient and adjust call bell cord, once I walked into the room, patient IMMEDIATELY started cursing stating she had called 2hours prior to current incident for pain meds. RN informed her that she had her last dosage of pain meds, when Dr. Tamera Punt informed patient that she was being discharged she got very very loud and stated to the Dr. Parks Ranger she " was not leaving in this type of pain, stated she needs to be admitted and cured of her sickle cell.

## 2017-10-05 NOTE — ED Notes (Signed)
PATIENT ESCORTED OUT BY SECURITY AT THIS TIME, FAMILY HERE TO PICK PATIENT UP. RIPPED PATIENT DISCHARGE INSTRUCTIONS PLACED IN PATIENT BELONGINGS BAG FOR PATIENT. PORT DEACCESSED.

## 2017-10-05 NOTE — ED Notes (Addendum)
Nancy Sousa RN and this Probation officer attempted to go in and discharge patient. Patient immediately ripped up her discharge paperwork and threw them at this writer. Charge nurse, MD and security currently at bedside. Patient refusing to let anyone deaccess her port at this time and requesting to speak to Dr. Jonelle Sidle. Patient has taken herself off the cardiac monitor completely and is currently calling her mother in emotional distress.

## 2017-10-05 NOTE — ED Notes (Signed)
Patient transported to X-ray 

## 2017-10-06 DIAGNOSIS — D5700 Hb-SS disease with crisis, unspecified: Principal | ICD-10-CM

## 2017-10-06 DIAGNOSIS — D638 Anemia in other chronic diseases classified elsewhere: Secondary | ICD-10-CM

## 2017-10-06 LAB — CBC WITH DIFFERENTIAL/PLATELET
Basophils Absolute: 0 10*3/uL (ref 0.0–0.1)
Basophils Relative: 0 %
EOS PCT: 0 %
Eosinophils Absolute: 0 10*3/uL (ref 0.0–0.7)
HEMATOCRIT: 19.8 % — AB (ref 36.0–46.0)
Hemoglobin: 6.8 g/dL — CL (ref 12.0–15.0)
Lymphocytes Relative: 14 %
Lymphs Abs: 2.4 10*3/uL (ref 0.7–4.0)
MCH: 32.1 pg (ref 26.0–34.0)
MCHC: 34.3 g/dL (ref 30.0–36.0)
MCV: 93.4 fL (ref 78.0–100.0)
Monocytes Absolute: 1.2 10*3/uL — ABNORMAL HIGH (ref 0.1–1.0)
Monocytes Relative: 7 %
NEUTROS ABS: 13.7 10*3/uL — AB (ref 1.7–7.7)
NRBC: 15 /100{WBCs} — AB
Neutrophils Relative %: 79 %
Platelets: 513 10*3/uL — ABNORMAL HIGH (ref 150–400)
RBC: 2.12 MIL/uL — ABNORMAL LOW (ref 3.87–5.11)
RDW: 22 % — ABNORMAL HIGH (ref 11.5–15.5)
WBC: 17.3 10*3/uL — ABNORMAL HIGH (ref 4.0–10.5)

## 2017-10-06 LAB — COMPREHENSIVE METABOLIC PANEL
ALBUMIN: 3.7 g/dL (ref 3.5–5.0)
ALK PHOS: 68 U/L (ref 38–126)
ALT: 39 U/L (ref 14–54)
AST: 32 U/L (ref 15–41)
Anion gap: 8 (ref 5–15)
BUN: 7 mg/dL (ref 6–20)
CALCIUM: 8.4 mg/dL — AB (ref 8.9–10.3)
CHLORIDE: 105 mmol/L (ref 101–111)
CO2: 25 mmol/L (ref 22–32)
CREATININE: 0.61 mg/dL (ref 0.44–1.00)
GFR calc Af Amer: >60 mL/min (ref >60)
GFR calc non Af Amer: >60 mL/min (ref >60)
GLUCOSE: 115 mg/dL — AB (ref 65–99)
Potassium: 3.9 mmol/L (ref 3.5–5.1)
Sodium: 138 mmol/L (ref 135–145)
Total Bilirubin: 1.5 mg/dL — ABNORMAL HIGH (ref 0.3–1.2)
Total Protein: 6.8 g/dL (ref 6.5–8.1)

## 2017-10-06 LAB — LACTATE DEHYDROGENASE: LDH: 288 U/L — ABNORMAL HIGH (ref 98–192)

## 2017-10-06 MED ORDER — ONDANSETRON HCL 4 MG/2ML IJ SOLN
4.0000 mg | Freq: Four times a day (QID) | INTRAMUSCULAR | Status: DC | PRN
  Filled 2017-10-06: qty 2

## 2017-10-06 MED ORDER — HYDROXYUREA 500 MG PO CAPS
1500.0000 mg | ORAL_CAPSULE | Freq: Every day | ORAL | Status: DC
  Administered 2017-10-06 – 2017-10-08 (×3): 1500 mg via ORAL
  Filled 2017-10-06 (×5): qty 3

## 2017-10-06 MED ORDER — HYDROMORPHONE 1 MG/ML IV SOLN
INTRAVENOUS | Status: DC
  Administered 2017-10-06: 1 mg via INTRAVENOUS
  Administered 2017-10-06: 03:00:00 via INTRAVENOUS
  Administered 2017-10-06: 7.14 mg via INTRAVENOUS
  Administered 2017-10-06: 1 mg via INTRAVENOUS
  Administered 2017-10-06: 09:00:00 via INTRAVENOUS
  Administered 2017-10-07: 28.71 mg via INTRAVENOUS
  Administered 2017-10-07: 20.16 mg via INTRAVENOUS
  Administered 2017-10-07: 12.26 mg via INTRAVENOUS
  Administered 2017-10-07: 12 mg via INTRAVENOUS
  Administered 2017-10-07: 13.76 mg via INTRAVENOUS
  Administered 2017-10-07 (×2): via INTRAVENOUS
  Administered 2017-10-08: 10.19 mg via INTRAVENOUS
  Administered 2017-10-08: 01:00:00 via INTRAVENOUS
  Administered 2017-10-08: 0.86 mg via INTRAVENOUS
  Administered 2017-10-08: 11.77 mg via INTRAVENOUS
  Administered 2017-10-08: 6.2 mg via INTRAVENOUS
  Administered 2017-10-08: 9.46 mg via INTRAVENOUS
  Administered 2017-10-08: 20:00:00 via INTRAVENOUS
  Administered 2017-10-08: 6.88 mg via INTRAVENOUS
  Administered 2017-10-09: 5 mg via INTRAVENOUS
  Administered 2017-10-09: 6.5 mg via INTRAVENOUS
  Administered 2017-10-09: 16.34 mg via INTRAVENOUS
  Administered 2017-10-09: 03:00:00 via INTRAVENOUS
  Administered 2017-10-09: 8.36 mg via INTRAVENOUS
  Filled 2017-10-06 (×10): qty 25

## 2017-10-06 MED ORDER — KETOROLAC TROMETHAMINE 15 MG/ML IJ SOLN
15.0000 mg | Freq: Four times a day (QID) | INTRAMUSCULAR | Status: AC
  Administered 2017-10-06 – 2017-10-07 (×5): 15 mg via INTRAVENOUS
  Filled 2017-10-06 (×6): qty 1

## 2017-10-06 MED ORDER — NALOXONE HCL 0.4 MG/ML IJ SOLN: 0.4000 mg | INTRAMUSCULAR | Status: DC | PRN

## 2017-10-06 MED ORDER — MORPHINE SULFATE ER 30 MG PO TBCR
30.0000 mg | EXTENDED_RELEASE_TABLET | Freq: Two times a day (BID) | ORAL | Status: DC
  Administered 2017-10-06 – 2017-10-09 (×8): 30 mg via ORAL
  Filled 2017-10-06 (×9): qty 1

## 2017-10-06 MED ORDER — SENNOSIDES-DOCUSATE SODIUM 8.6-50 MG PO TABS
1.0000 | ORAL_TABLET | Freq: Two times a day (BID) | ORAL | Status: DC
  Administered 2017-10-06 – 2017-10-08 (×4): 1 via ORAL
  Filled 2017-10-06 (×7): qty 1

## 2017-10-06 MED ORDER — ENOXAPARIN SODIUM 40 MG/0.4ML ~~LOC~~ SOLN
40.0000 mg | Freq: Every day | SUBCUTANEOUS | Status: DC
  Administered 2017-10-07: 40 mg via SUBCUTANEOUS
  Filled 2017-10-06 (×3): qty 0.4

## 2017-10-06 MED ORDER — POLYETHYLENE GLYCOL 3350 17 G PO PACK: 17.0000 g | PACK | Freq: Every day | ORAL | Status: DC | PRN

## 2017-10-06 MED ORDER — HYDROMORPHONE HCL 2 MG/ML IJ SOLN
2.0000 mg | INTRAMUSCULAR | Status: AC | PRN
  Administered 2017-10-06 (×3): 2 mg via INTRAVENOUS
  Filled 2017-10-06 (×3): qty 1

## 2017-10-06 MED ORDER — SODIUM CHLORIDE 0.45 % IV SOLN
INTRAVENOUS | Status: AC
  Administered 2017-10-06 (×2): via INTRAVENOUS

## 2017-10-06 MED ORDER — DIPHENHYDRAMINE HCL 25 MG PO CAPS
25.0000 mg | ORAL_CAPSULE | ORAL | Status: DC | PRN
  Filled 2017-10-06: qty 1

## 2017-10-06 MED ORDER — SODIUM CHLORIDE 0.9% FLUSH: 9.0000 mL | INTRAVENOUS | Status: DC | PRN

## 2017-10-06 MED ORDER — FOLIC ACID 1 MG PO TABS
1.0000 mg | ORAL_TABLET | Freq: Every day | ORAL | Status: DC
  Administered 2017-10-06 – 2017-10-09 (×4): 1 mg via ORAL
  Filled 2017-10-06 (×4): qty 1

## 2017-10-06 MED ORDER — SODIUM CHLORIDE 0.9 % IV SOLN
25.0000 mg | INTRAVENOUS | Status: DC | PRN
  Administered 2017-10-06: 25 mg via INTRAVENOUS
  Filled 2017-10-06: qty 25
  Filled 2017-10-06: qty 0.5

## 2017-10-06 NOTE — ED Notes (Signed)
Pt has pain, pharmacy notified for MScontin and text paged admitting MD for prn dose of medication.

## 2017-10-06 NOTE — ED Notes (Signed)
[facility] called for transport 

## 2017-10-06 NOTE — Progress Notes (Signed)
Subjective: A 27 year old female admitted with sickle cell crisis. patient is complaining of 9 out of 10  She has leukocytosis with significant anemia. She is still crying with pain at 9 out of 10.   She is on Dilaudid PCA with how long-acting MS Contin.  Objective: Vital signs in last 24 hours: Temp:  [98.3 F (36.8 C)-100.6 F (38.1 C)] 99.6 F (37.6 C) (05/24 0330) Pulse Rate:  [84-115] 99 (05/24 0330) Resp:  [12-22] 20 (05/24 0330) BP: (93-129)/(26-100) 124/71 (05/24 0330) SpO2:  [83 %-100 %] 97 % (05/24 0330) FiO2 (%):  [100 %] 100 % (05/24 0231) Weight:  [86.2 kg (190 lb)-93.6 kg (206 lb 5.6 oz)] 93.6 kg (206 lb 5.6 oz) (05/24 0330) Weight change:  Last BM Date: 10/06/17  Intake/Output from previous day: 05/23 0701 - 05/24 0700 In: 3223.3 [P.O.:240; I.V.:1233.3; IV Piggyback:1750] Out: -  Intake/Output this shift: Total I/O In: 265 [I.V.:265] Out: -   General appearance: alert, cooperative, appears stated age and no distress Back: symmetric, no curvature. ROM normal. No CVA tenderness. Resp: clear to auscultation bilaterally Cardio: regular rate and rhythm, S1, S2 normal, no murmur, click, rub or gallop GI: soft, non-tender; bowel sounds normal; no masses,  no organomegaly Extremities: extremities normal, atraumatic, no cyanosis or edema Pulses: 2+ and symmetric Neurologic: Grossly normal  Lab Results: Recent Labs    10/05/17 1841 10/06/17 0658  WBC 19.6* 17.3*  HGB 7.0* 6.8*  HCT 21.1* 19.8*  PLT 520* 513*   BMET Recent Labs    10/05/17 1841 10/06/17 0658  NA 139 138  K 3.9 3.9  CL 106 105  CO2 26 25  GLUCOSE 92 115*  BUN 7 7  CREATININE 0.75 0.61  CALCIUM 8.7* 8.4*    Studies/Results: Dg Chest 2 View  Result Date: 10/05/2017 CLINICAL DATA:  Fever for 1 day, history of sickle cell anemia, smoking history EXAM: CHEST - 2 VIEW COMPARISON:  Chest x-ray of 10/05/2016 FINDINGS: No active infiltrate or effusion is seen. Right-sided Port-A-Cath remains  with the tip overlying mid SVC. Mediastinal and hilar contours are unremarkable and the heart is mildly enlarged. 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 IMPRESSION: 1. No active lung disease. 2. Port-A-Cath tip overlies the mid SVC. 3. 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 Electronically Signed   By: Ivar Drape M.D.   On: 10/05/2017 17:09   Dg Chest 2 View  Result Date: 10/05/2017 CLINICAL DATA:  Sickle cell crisis. EXAM: CHEST - 2 VIEW COMPARISON:  09/29/2017. FINDINGS: PowerPort catheter with tip over the superior vena cava. Cardiomegaly with mild pulmonary vascular prominence. No focal infiltrate. Low lung volumes. No pleural effusion or pneumothorax. IMPRESSION: 1.  PowerPort catheter with tip over the superior vena cava. 2. Cardiomegaly with mild pulmonary vascular prominence. Mild right mid lung field subsegmental atelectasis. Electronically Signed   By: Marcello Moores  Register   On: 10/05/2017 09:20    Medications: I have reviewed the patient's current medications.  Assessment/Plan: A 27 year old female admitted with sickle cell painful crisis  #1 sickle cell painful crisis: patient on weight-based  Dilaudid PCA.  Continue treatment.  Just discharged 2 days ago.  #2 sickle cell anemia: H&H is stable.  Continue current treatment  #3  Polysubstance abuse: counseling provided.   No change in therapy.  #4 leukocytosis: monitor white cell count.  Slightly improved.  Antibiotic is discontinued   LOS: 1 day   GARBA,LAWAL 10/06/2017, 8:51 AM

## 2017-10-06 NOTE — ED Notes (Signed)
creatnine and CBC not redrawn for lovenox as there was an result for this already

## 2017-10-06 NOTE — H&P (Addendum)
History and Physical    BERNADETT [name] IOX:[id number] DOB: 16-Jun-1990 DOA: 10/05/2017  PCP: Scot Jun, FNP  Patient coming from: Home.  Chief Complaint: Pain.  HPI: [name] is a 27 y.o. female with history of sickle cell anemia presents to the ER with complaints of persistent pain since yesterday morning.  Pain is mostly in the right side lower extremity upper extremity and side of the chest.  Denies any cough fever chills headache visual symptoms or any focal deficits.  Patient has been taking long-acting and as needed pain relief medications and hydroxyurea despite which patient's pain has not improved.  Pain is mostly in the right hip area right upper extremity and right-sided chest chest pain is mostly pleuritic.  ED Course: In the ER there was concern for SIRS since patient was mildly febrile with leukocytosis blood cultures obtained chest x-ray does not show any infiltrates.  UA is unremarkable.  Patient was empirically started on antibiotics started on pain relief medication and admitted for further management of sickle cell pain crisis.  On my exam patient is not hypoxic.  Review of Systems: As per HPI, rest all negative.   Past Medical History:  Diagnosis Date  . Blood transfusion    "lots"  . Blood transfusion without reported diagnosis   . Chronic back pain    "very severe; have knot in my back; from tight muscle; take RX and exercise for it"  . Depression 01/06/2011  . Exertional dyspnea    "sometimes"  . Genital HSV   . GERD (gastroesophageal reflux disease) 02/17/2011  . Migraines 11/08/11   "@ least twice/month"  . 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  . Mood swings 11/08/11   "I go back and forth; real bad"  . Sickle cell anemia ([name])   . Sickle cell anemia with crisis ([place])   . Thrombocytosis ([place]) 11/09/2015  . Trichotillomania    h/o    Past Surgical History:  Procedure Laterality Date  . CHOLECYSTECTOMY  05/2010  . DILATION  AND CURETTAGE OF UTERUS  02/20/11   S/P miscarriage  . IR GENERIC HISTORICAL  12/23/2015   IR FLUORO GUIDE CV LINE RIGHT 12/23/2015 Jacqulynn Cadet, MD [facility]  . IR GENERIC HISTORICAL  12/23/2015   IR US GUIDE VASC ACCESS RIGHT 12/23/2015 Jacqulynn Cadet, MD [facility]     reports that she has been smoking cigarettes.  She has a 0.25 pack-year smoking history. She has never used smokeless tobacco. She reports that she does not drink alcohol or use drugs.  Allergies  Allergen Reactions  . Carrot [Daucus Carota] Hives and Swelling    Allergic to carrots  . Latex Rash    Family History  Problem Relation Age of Onset  . Sickle cell trait Mother   . Sickle cell trait Father   . Diabetes Maternal Grandmother   . Diabetes Paternal Grandmother   . Hypertension Paternal Grandmother   . Diabetes Maternal Grandfather     Prior to Admission medications   Medication Sig Start Date End Date Taking? Authorizing Provider  aspirin-acetaminophen-caffeine (EXCEDRIN MIGRAINE) [phone] MG tablet Take 2 tablets every 6 (six) hours as needed by mouth for headache.   Yes [provider]  folic acid (FOLVITE) 1 MG tablet Take 1 mg by mouth daily.   Yes [provider]  hydroxyurea (HYDREA) 500 MG capsule Take 2 tabs (1000 mg ) PO QOD starting on 06/02/2017 alternating with 1 tab (500 mg) PO QOD  starting on 06/03/2017. May take with food to minimize GI side effects. Patient taking differently: Take 1,500 mg by mouth daily. May take with food to minimize GI side effects. 07/24/17  Yes Scot Jun, FNP  morphine (MS CONTIN) 30 MG 12 hr tablet Take 1 tablet (30 mg total) by mouth every 12 (twelve) hours for 7 days. 10/03/17 10/10/17 Yes Elwyn Reach, MD  Oxycodone HCl 20 MG TABS Take 1 tablet (20 mg total) by mouth every 4 (four) hours as needed for up to 7 days (pain). 10/03/17 10/10/17 Yes Elwyn Reach, MD  cyclobenzaprine (FLEXERIL) 10 MG tablet Take 1 tablet (10 mg total)  by mouth 3 (three) times daily as needed for muscle spasms. Patient not taking: Reported on 09/29/2017 07/24/17   Scot Jun, FNP  gabapentin (NEURONTIN) 300 MG capsule Take 1 capsule (300 mg total) by mouth 3 (three) times daily. Patient not taking: Reported on 09/29/2017 07/24/17   Scot Jun, FNP    Physical Exam: Vitals:   10/05/17 1833 10/05/17 1921 10/05/17 2025 10/05/17 2158  BP:  (!) 109/51 (!) 102/53 (!) 103/54  Pulse:  93 (!) 105 (!) 106  Resp:  14 18 18   Temp: 100.3 F (37.9 C)     TempSrc: Rectal     SpO2:  100% 100% 99%  Weight:      Height:          Constitutional: Moderately built and nourished. Vitals:   10/05/17 1833 10/05/17 1921 10/05/17 2025 10/05/17 2158  BP:  (!) 109/51 (!) 102/53 (!) 103/54  Pulse:  93 (!) 105 (!) 106  Resp:  14 18 18   Temp: 100.3 F (37.9 C)     TempSrc: Rectal     SpO2:  100% 100% 99%  Weight:      Height:       Eyes: Anicteric no pallor. ENMT: No discharge from the ears eyes nose or mouth. Neck: No mass felt.  No neck rigidity.  No JVD appreciated. Respiratory: No rhonchi or crepitations. Cardiovascular: S1-S2 heard no murmurs appreciated. Abdomen: Soft nontender bowel sounds present. Musculoskeletal: No edema.  No joint effusion. Skin: No rash. Neurologic: Alert awake oriented to time place and person.  Moves all extremities 5 x 5.  No facial asymmetry tongue is midline. Psychiatric: Appears normal.  Normal affect.   Labs on Admission: I have personally reviewed following labs and imaging studies  CBC: Recent Labs  Lab 09/29/17 1949 10/03/17 0604 10/05/17 0705 10/05/17 1841  WBC 11.7* 15.4* 16.0* 19.6*  NEUTROABS 9.3* 10.2* 11.1 16.8*  HGB 9.3* 6.8* 6.8* 7.0*  HCT 27.8* 20.7* 19.2* 21.1*  MCV 92.7 92.8 93.2 95.5  PLT 600* 455* 465* 497*   Basic Metabolic Panel: Recent Labs  Lab 09/29/17 1949 09/30/17 0620 10/03/17 0604 10/05/17 0705 10/05/17 1841  NA 139  --  142 139 139  K 3.5  --  4.1 3.8  3.9  CL 107  --  106 105 106  CO2 22  --  28 25 26   GLUCOSE 98  --  106* 113* 92  BUN 5*  --  9 10 7   CREATININE 0.64  --  0.93 0.75 0.75  CALCIUM 9.5  --  9.1 8.7* 8.7*  MG  --  2.5*  --   --   --   PHOS  --  5.3*  --   --   --    GFR: Estimated Creatinine Clearance: 107.3 mL/min (by C-G formula based  on SCr of 0.75 mg/dL). Liver Function Tests: Recent Labs  Lab 09/29/17 1949 10/03/17 0604 10/05/17 0705 10/05/17 1841  AST 20 28 28  32  ALT 19 22 26  32  ALKPHOS 83 81 70 70  BILITOT 2.7* 2.3* 1.7* 2.1*  PROT 8.1 6.7 6.5 6.3*  ALBUMIN 4.5 3.8 3.8 3.8   No results for input(s): LIPASE, AMYLASE in the last 168 hours. No results for input(s): AMMONIA in the last 168 hours. Coagulation Profile: No results for input(s): INR, PROTIME in the last 168 hours. Cardiac Enzymes: No results for input(s): CKTOTAL, CKMB, CKMBINDEX, TROPONINI in the last 168 hours. BNP (last 3 results) No results for input(s): PROBNP in the last 8760 hours. HbA1C: No results for input(s): HGBA1C in the last 72 hours. CBG: No results for input(s): GLUCAP in the last 168 hours. Lipid Profile: No results for input(s): CHOL, HDL, LDLCALC, TRIG, CHOLHDL, LDLDIRECT in the last 72 hours. Thyroid Function Tests: No results for input(s): TSH, T4TOTAL, FREET4, T3FREE, THYROIDAB in the last 72 hours. Anemia Panel: Recent Labs    10/05/17 0705 10/05/17 1841  RETICCTPCT >23.0* 20.4*   Urine analysis:    Component Value Date/Time   COLORURINE YELLOW 10/05/2017 1802   APPEARANCEUR CLEAR 10/05/2017 1802   APPEARANCEUR Clear 05/06/2012 1546   LABSPEC 1.008 10/05/2017 1802   LABSPEC 1.011 05/06/2012 1546   PHURINE 7.0 10/05/2017 1802   GLUCOSEU NEGATIVE 10/05/2017 1802   GLUCOSEU Negative 05/06/2012 1546   HGBUR NEGATIVE 10/05/2017 1802   HGBUR negative 01/08/2009 [place] 10/05/2017 1802   BILIRUBINUR Negative 05/06/2012 1546   KETONESUR NEGATIVE 10/05/2017 1802   PROTEINUR NEGATIVE  10/05/2017 1802   UROBILINOGEN 0.2 07/24/2017 1347   NITRITE NEGATIVE 10/05/2017 1802   LEUKOCYTESUR MODERATE (A) 10/05/2017 1802   LEUKOCYTESUR Negative 05/06/2012 1546   Sepsis Labs: @LABRCNTIP (procalcitonin:4,lacticidven:4) )No results found for this or any previous visit (from the past 240 hour(s)).   Radiological Exams on Admission: Dg Chest 2 View  Result Date: 10/05/2017 CLINICAL DATA:  Fever for 1 day, history of sickle cell anemia, smoking history EXAM: CHEST - 2 VIEW COMPARISON:  Chest x-ray of 10/05/2016 FINDINGS: No active infiltrate or effusion is seen. Right-sided Port-A-Cath remains with the tip overlying mid SVC. Mediastinal and hilar contours are unremarkable and the heart is mildly enlarged. 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 IMPRESSION: 1. No active lung disease. 2. Port-A-Cath tip overlies the mid SVC. 3. 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 Electronically Signed   By: Ivar Drape M.D.   On: 10/05/2017 17:09   Dg Chest 2 View  Result Date: 10/05/2017 CLINICAL DATA:  Sickle cell crisis. EXAM: CHEST - 2 VIEW COMPARISON:  09/29/2017. FINDINGS: PowerPort catheter with tip over the superior vena cava. Cardiomegaly with mild pulmonary vascular prominence. No focal infiltrate. Low lung volumes. No pleural effusion or pneumothorax. IMPRESSION: 1.  PowerPort catheter with tip over the superior vena cava. 2. Cardiomegaly with mild pulmonary vascular prominence. Mild right mid lung field subsegmental atelectasis. Electronically Signed   By: Marcello Moores  Register   On: 10/05/2017 09:20    EKG: Independently reviewed.  Normal sinus rhythm.  Assessment/Plan Principal Problem:   Sickle cell pain crisis ([place]) Active Problems:   Anemia of chronic disease    1. Sickle cell pain crisis -patient has been continued on long-acting pain medications.  Also on hydroxyurea.  Patient is placed on weight-based Dilaudid PCA.   Continue with  hydration.  Since patient is initially tachycardic mildly febrile and a complaint of right-sided pleuritic type of chest pain antibiotics has been started and will be continued for now.  Follow cultures.  No definite signs of any acute chest syndrome.  Patient is not hypoxic. 2. Anemia secondary to sickle cell disease -hemoglobin appears to be at baseline.  If there is any further decline may need transfusion and holding off hydroxyurea. 3. History of PE used to be on anticoagulation which was stopped last month by patient's primary care physician as per the patient.   DVT prophylaxis: Lovenox. Code Status: Full code. Family Communication: Discussed with patient. Disposition Plan: Home. Consults called: None. Admission status: Inpatient.   Rise Patience MD [facility] Pager [phone].  If 7PM-7AM, please contact night-coverage [email or web address] Password [email or web address]  10/06/2017, 12:02 AM

## 2017-10-06 NOTE — ED Notes (Signed)
Admitting MD gave verbal order for PRN pain medication until PCA can be started

## 2017-10-06 NOTE — ED Notes (Signed)
ED TO INPATIENT HANDOFF REPORT  Name/Age/Gender [name] 27 y.o. female  Code Status    Code Status Orders  (From admission, onward)        Start     Ordered   10/06/17 0001  Full code  Continuous     10/06/17 0002    Code Status History    Date Active Date Inactive Code Status Order ID Comments User Context   09/30/2017 0122 10/03/2017 2319 Full Code [id number]  Toy Baker, MD Inpatient   07/10/2017 1947 07/13/2017 1916 Full Code [id number]  Elwyn Reach, MD ED   07/02/2017 0848 07/07/2017 2044 Full Code [id number]  Elwyn Reach, MD Inpatient   05/27/2017 2139 06/01/2017 1937 Full Code [id number]  Etta Quill, DO ED   05/04/2017 2105 05/07/2017 2127 Full Code [id number]  Rise Patience, MD ED   04/01/2017 1813 04/03/2017 1841 Full Code [id number]  Aline August, MD ED   02/28/2017 0045 03/05/2017 2133 Full Code [id number]  Etta Quill, DO ED   02/02/2017 0309 02/04/2017 2250 Full Code [id number]  Bethena Roys, MD Inpatient   12/10/2016 0403 12/14/2016 1705 Full Code [id number]  Etta Quill, DO ED   11/06/2016 2039 11/11/2016 1659 Full Code [id number]  Patrecia Pour, MD Inpatient   10/06/2016 1647 10/09/2016 1809 Full Code [id number]  [name], MD ED   10/02/2016 1512 10/05/2016 1042 Full Code [id number]  Tresa Garter, MD Inpatient   09/15/2016 1723 09/19/2016 2025 Full Code [id number]  Dorena Dew, [place] Inpatient   08/20/2016 1812 08/30/2016 1613 Full Code [id number]  [name], MD Inpatient   07/25/2016 2320 07/29/2016 2126 Full Code [id number]  Norval Morton, MD ED   06/26/2016 2154 07/02/2016 1954 Full Code [id number]  Etta Quill, DO ED   06/10/2016 0511 06/15/2016 1511 Full Code [id number]  Etta Quill, DO ED   05/29/2016 2007 05/31/2016 1803 Full Code [id number]  Cristal Ford, DO Inpatient   05/19/2016 1914 05/25/2016 2005 Full Code [id number]  Charlynne Cousins, MD Inpatient   04/24/2016 1059 05/02/2016 1750  Full Code [id number]  Elwyn Reach, MD Inpatient   04/17/2016 2105 04/20/2016 1913 Full Code [id number]  Reubin Milan, MD Inpatient   04/17/2016 2105 04/17/2016 2105 Full Code [id number]  Reubin Milan, MD Inpatient   03/31/2016 1638 04/07/2016 1808 Full Code [id number]  [name], MD Inpatient   02/26/2016 1859 03/03/2016 2104 Full Code [id number]  [name], MD Inpatient   02/16/2016 1033 02/17/2016 1101 Full Code [id number]  Tresa Garter, MD Inpatient   01/30/2016 0751 02/05/2016 1904 Full Code [id number]  Ivor Costa, MD ED   01/05/2016 0837 01/09/2016 1727 Full Code [id number]  Tresa Garter, MD Inpatient   12/27/2015 0358 12/29/2015 2009 Full Code [id number]  Ivor Costa, MD ED   11/09/2015 0118 11/12/2015 1837 Full Code [id number]  Norval Morton, MD ED   10/31/2015 1854 11/02/2015 1456 Full Code [id number]  Manya Silvas, [place] Inpatient   10/30/2015 1446 10/31/2015 1854 Full Code [id number]  Lavonia Drafts, MD Inpatient   10/26/2015 2125 10/30/2015 1446 Full Code [id number]  Gwynne Edinger, MD Inpatient   10/15/2015 1857 10/18/2015 2027 Full Code [id number]  [name], MD Inpatient   10/06/2015 2258 10/10/2015 1623 Full Code [id number]  Lily Kocher, MD ED   09/22/2015 2026 09/27/2015 1735 Full Code [id number]  Osborne Oman, MD Inpatient   09/07/2015 1431 09/14/2015 1454 Full Code  [id number]  [name], MD Inpatient   08/10/2015 1405 08/17/2015 1638 Full Code [id number]  [name], MD Inpatient   07/24/2015 0531 08/02/2015 1817 Full Code [id number]  Carlyle Dolly, MD Inpatient   07/24/2015 0447 07/24/2015 0531 Full Code [id number]  Carlyle Dolly, MD Inpatient   07/24/2015 0353 07/24/2015 0447 Full Code [id number]  Carlyle Dolly, MD Inpatient   07/12/2015 0214 07/14/2015 2028 Full Code [id number]  Ivor Costa, MD ED   06/28/2015 1037 07/03/2015 1441 Full Code [id number]  [name], MD Inpatient   05/31/2015 1459  06/05/2015 2049 Full Code [id number]  [name], MD ED   05/02/2015 1639 05/08/2015 1644 Full Code [id number]  [name], MD Inpatient   04/14/2015 1741 04/22/2015 2006 Full Code [id number]  Tresa Garter, MD Inpatient   04/14/2015 1741 04/14/2015 1741 Full Code [id number]  Tresa Garter, MD Inpatient   04/14/2015 1153 04/14/2015 1741 Full Code [id number]  Tresa Garter, MD Inpatient   03/30/2015 1104 04/03/2015 1742 Full Code [id number]  Tresa Garter, MD Inpatient   03/22/2015 2042 03/26/2015 1747 Full Code [id number]  Reubin Milan, MD ED   01/25/2015 1323 01/27/2015 1554 Full Code [id number]  Elwyn Reach, MD Inpatient   12/07/2014 1557 12/10/2014 1720 Full Code [id number]  Keitha Butte, CNM Inpatient   12/05/2014 0920 12/07/2014 1557 Full Code [id number]  Tami Ribas, RN Inpatient   11/26/2014 2216 12/01/2014 2033 Full Code [id number]  Virginia Crews, MD Inpatient   11/19/2014 0129 11/21/2014 1812 Full Code [id number]  Seabron Spates, CNM Inpatient   10/23/2014 2118 10/30/2014 1932 Full Code [id number]  Truett Mainland, DO Inpatient   10/11/2014 0010 10/14/2014 1707 Full Code [id number]  Larey Days, [place] Inpatient   09/18/2014 1545 09/26/2014 1656 Full Code [id number]  Lavonia Drafts, MD Inpatient   09/08/2014 0117 09/10/2014 1821 Full Code [id number]  Lavonia Drafts, MD Inpatient   09/08/2014 0117 09/08/2014 0117 Full Code [id number]  Lavonia Drafts, MD Inpatient   08/09/2014 2137 08/15/2014 2048 Full Code [id number]  Tresea Mall, CNM Inpatient   07/14/2014 0301 07/16/2014 1653 Full Code [id number]  Brantley Persons, RN Inpatient   06/25/2014 1651 06/28/2014 1705 Full Code [id number]  [name], MD Inpatient   06/02/2014 0106 06/05/2014 1936 Full Code [id number]  Elberta Leatherwood, MD Inpatient   05/03/2014 1818 05/06/2014 2158 Full Code [id number]  Markus Daft, MD Inpatient   05/02/2014 1207 05/03/2014 1818 Full Code  [id number]  [name], MD ED   04/10/2014 1641 04/18/2014 2117 Full Code [id number]  Greggory Keen, MD Inpatient   04/09/2014 1028 04/10/2014 1641 Full Code [id number]  Theodis Blaze, MD Inpatient   02/16/2014 2020 02/25/2014 1411 Full Code [id number]  Caren Griffins, MD ED   01/29/2014 0343 02/01/2014 1442 Full Code [id number]  Rise Patience, MD Inpatient   01/13/2014 1815 01/16/2014 1304 Full Code [id number]  Robbie Lis, MD Inpatient   11/25/2013 1437 11/30/2013 2009 Full Code [id number]  Verlee Monte, MD Inpatient   09/24/2013 0615 09/27/2013 1532 Full Code [id number]  Rise Patience, MD Inpatient   07/31/2013 0312 08/04/2013 2047 Full Code [id number]  Etta Quill, DO ED   07/16/2013 0907 07/16/2013 2310 Full Code [id number]  [name], MD Inpatient   05/28/2013 0515 06/01/2013 2117 Full Code [id number]  Berle Mull, MD ED   03/23/2013 0430 03/29/2013 1937 Full Code [id number]  Theressa Millard, MD  ED   02/11/2013 0803 02/14/2013 1639 Full Code [id number]  Rise Patience, MD Inpatient   01/27/2013 1257 01/30/2013 1853 Full Code [id number]  Domenic Polite, MD Inpatient   10/05/2012 2154 10/12/2012 2107 Full Code [id number]  Theressa Millard, MD ED   08/27/2012 1955 08/31/2012 1833 Full Code [id number]  Thurnell Lose, MD Inpatient   06/22/2012 1436 06/27/2012 1457 Full Code [id number]  Jorene Guest, RN Inpatient   03/28/2012 2122 04/04/2012 1929 Full Code [id number]  Verl Dicker, PA-C ED   03/24/2012 0432 03/25/2012 1144 Full Code [id number]  Martie Lee, [place] ED   03/04/2012 0448 03/15/2012 2258 Full Code [id number]  Kennith Center, RN Inpatient   02/13/2012 0647 02/17/2012 0124 Full Code [id number]  Jacqualyn Posey, RN Inpatient   12/17/2011 1614 12/22/2011 1716 Full Code [id number]  Theodis Blaze, MD Inpatient   12/16/2011 2205 12/17/2011 1614 Full Code [id number]  Threasa Beards, MD ED   11/30/2011 0309 12/03/2011 1745 Full Code [id number]  Evelina Dun, RN ED   11/08/2011 0723  11/14/2011 1823 Full Code [id number]  Ron Parker, RN ED   09/05/2011 0204 09/05/2011 0732 Full Code [id number]  Garald Balding, NP ED   08/05/2011 0755 08/05/2011 1852 Full Code [id number]  Rainey Pines, RN ED   08/05/2011 0028 08/05/2011 0755 Full Code [id number]  Charlena Cross, MD ED   07/13/2011 1149 07/15/2011 1847 Full Code [id number]  Julieta Bellini, NP ED   06/09/2011 0048 06/09/2011 0959 Full Code [id number]  Charlena Cross, MD ED   05/31/2011 1828 06/01/2011 1837 Full Code [id number]  Jenel Lucks, RN Inpatient   05/26/2011 0218 05/26/2011 1419 Full Code [id number]  Molpus, Karen Chafe, MD ED   05/06/2011 0700 05/08/2011 1609 Full Code [id number]  [place], [place], MD Inpatient   04/17/2011 1329 04/19/2011 1650 Full Code [id number]  Judithann Sheen, MD ED   03/30/2011 0039 04/03/2011 0025 Full Code [id number]  Hoy Morn, MD ED      Home/SNF/Other Home  Chief Complaint Sickle Cell Crisis  Level of Care/Admitting Diagnosis ED Disposition    ED Disposition Condition [place]: [facility] [[id number]]  Level of Care: Med-Surg [16]  Diagnosis: Sickle cell pain crisis [facility]) [[id number]]  Admitting Physician: Rise Patience [phone]  Attending Physician: Rise Patience [phone]  Estimated length of stay: past midnight tomorrow  Certification:: I certify this patient will need inpatient services for at least 2 midnights  PT Class (Do Not Modify): Inpatient [101]  PT Acc Code (Do Not Modify): Private [1]       Medical History Past Medical History:  Diagnosis Date  . Blood transfusion    "lots"  . Blood transfusion without reported diagnosis   . Chronic back pain    "very severe; have knot in my back; from tight muscle; take RX and exercise for it"  . Depression 01/06/2011  . Exertional dyspnea    "sometimes"  . Genital HSV   . GERD (gastroesophageal reflux disease) 02/17/2011  . Migraines 11/08/11   "@ least twice/month"   . 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  . Mood swings 11/08/11   "I go back and forth; real bad"  . Sickle cell anemia ([name])   . Sickle cell anemia with crisis ([place])   . Thrombocytosis ([place]) 11/09/2015  . Trichotillomania    h/o    Allergies Allergies  Allergen Reactions  .  Carrot [Daucus [place]ota] Hives and Swelling    Allergic to carrots  . Latex Rash    IV Location/Drains/Wounds Patient Lines/Drains/Airways Status   Active Line/Drains/Airways    Name:   Placement date:   Placement time:   Site:   Days:   Implanted Port Right Chest   -    -    Chest             Labs/Imaging Results for orders placed or performed during the hospital encounter of 10/05/17 (from the past 48 hour(s))  Influenza panel by PCR (type A & B)     Status: None   Collection Time: 10/05/17  4:29 PM  Result Value Ref Range   Influenza A By PCR NEGATIVE NEGATIVE   Influenza B By PCR NEGATIVE NEGATIVE    Comment: (NOTE) The Xpert Xpress Flu assay is intended as an aid in the diagnosis of  influenza and should not be used as a sole basis for treatment.  This  assay is FDA approved for nasopharyngeal swab specimens only. Nasal  washings and aspirates are unacceptable for Xpert Xpress Flu testing. Performed at [facility], [place] [address] [place], [place] [address]   I-Stat beta hCG blood, ED     Status: None   Collection Time: 10/05/17  5:52 PM  Result Value Ref Range   I-stat hCG, quantitative <5.0 <5 mIU/mL   Comment 3            Comment:   GEST. AGE      CONC.  (mIU/mL)   <=1 WEEK        5 - 50     2 WEEKS       50 - 500     3 WEEKS       100 - 10,000     4 WEEKS     1,000 - 30,000        FEMALE AND NON-PREGNANT FEMALE:     LESS THAN 5 mIU/mL   I-Stat CG4 Lactic Acid, ED     Status: None   Collection Time: 10/05/17  5:54 PM  Result Value Ref Range   Lactic Acid, Venous 1.21 0.5 - 1.9 mmol/L  I-Stat venous blood gas, ED     Status: Abnormal   Collection Time: 10/05/17   5:55 PM  Result Value Ref Range   pH, Ven 7.371 7.250 - 7.430   pCO2, Ven 46.3 44.0 - 60.0 mmHg   pO2, Ven 47.0 (H) 32.0 - 45.0 mmHg   Bicarbonate 26.8 20.0 - 28.0 mmol/L   TCO2 28 22 - 32 mmol/L   O2 Saturation 81.0 %   Acid-Base Excess 1.0 0.0 - 2.0 mmol/L   Patient temperature HIDE    Collection site BRACHIAL ARTERY    Sample type VENOUS   Urinalysis, Routine w reflex microscopic     Status: Abnormal   Collection Time: 10/05/17  6:02 PM  Result Value Ref Range   Color, Urine YELLOW YELLOW   APPearance CLEAR CLEAR   Specific Gravity, Urine 1.008 1.005 - 1.030   pH 7.0 5.0 - 8.0   Glucose, UA NEGATIVE NEGATIVE mg/dL   Hgb urine dipstick NEGATIVE NEGATIVE   Bilirubin Urine NEGATIVE NEGATIVE   Ketones, ur NEGATIVE NEGATIVE mg/dL   Protein, ur NEGATIVE NEGATIVE mg/dL   Nitrite NEGATIVE NEGATIVE   Leukocytes, UA MODERATE (A) NEGATIVE   RBC / HPF 0-5 0 - 5 RBC/hpf   WBC, UA 0-5 0 - 5 WBC/hpf  Bacteria, UA RARE (A) NONE SEEN   Squamous Epithelial / LPF 0-5 0 - 5    Comment: Performed at [facility], [place] [address], [place], [place] [address]  Comprehensive metabolic panel     Status: Abnormal   Collection Time: 10/05/17  6:41 PM  Result Value Ref Range   Sodium 139 135 - 145 mmol/L   Potassium 3.9 3.5 - 5.1 mmol/L   Chloride 106 101 - 111 mmol/L   CO2 26 22 - 32 mmol/L   Glucose, Bld 92 65 - 99 mg/dL   BUN 7 6 - 20 mg/dL   Creatinine, Ser 0.75 0.44 - 1.00 mg/dL   Calcium 8.7 (L) 8.9 - 10.3 mg/dL   Total Protein 6.3 (L) 6.5 - 8.1 g/dL   Albumin 3.8 3.5 - 5.0 g/dL   AST 32 15 - 41 U/L   ALT 32 14 - 54 U/L   Alkaline Phosphatase 70 38 - 126 U/L   Total Bilirubin 2.1 (H) 0.3 - 1.2 mg/dL   GFR calc non Af Amer >60 >60 mL/min   GFR calc Af Amer >60 >60 mL/min    Comment: (NOTE) The eGFR has been calculated using the CKD EPI equation. This calculation has not been validated in all clinical situations. eGFR's persistently <60 mL/min signify possible Chronic  Kidney Disease.    Anion gap 7 5 - 15    Comment: Performed at [facility] [address], [place], [place] [address]  CBC with Differential     Status: Abnormal   Collection Time: 10/05/17  6:41 PM  Result Value Ref Range   WBC 19.6 (H) 4.0 - 10.5 K/uL   RBC 2.21 (L) 3.87 - 5.11 MIL/uL   Hemoglobin 7.0 (L) 12.0 - 15.0 g/dL   HCT 21.1 (L) 36.0 - 46.0 %   MCV 95.5 78.0 - 100.0 fL   MCH 31.7 26.0 - 34.0 pg   MCHC 33.2 30.0 - 36.0 g/dL   RDW 22.2 (H) 11.5 - 15.5 %   Platelets 520 (H) 150 - 400 K/uL   Neutrophils Relative % 86 %   Lymphocytes Relative 4 %   Monocytes Relative 8 %   Eosinophils Relative 1 %   Basophils Relative 1 %   Neutro Abs 16.8 (H) 1.7 - 7.7 K/uL   Lymphs Abs 0.8 0.7 - 4.0 K/uL   Monocytes Absolute 1.6 (H) 0.1 - 1.0 K/uL   Eosinophils Absolute 0.2 0.0 - 0.7 K/uL   Basophils Absolute 0.2 (H) 0.0 - 0.1 K/uL   RBC Morphology Sickle cells present     Comment: ELLIPTOCYTES POLYCHROMASIA PRESENT TARGET CELLS Performed at [facility], [address], [place], [place] [address]   Reticulocytes     Status: Abnormal   Collection Time: 10/05/17  6:41 PM  Result Value Ref Range   Retic Ct Pct 20.4 (H) 0.4 - 3.1 %   RBC. 2.21 (L) 3.87 - 5.11 MIL/uL   Retic Count, Absolute 450.8 (H) 19.0 - 186.0 K/uL    Comment: Performed at [facility] [address], [place], [place] [address]  Sedimentation rate     Status: None   Collection Time: 10/05/17  6:42 PM  Result Value Ref Range   Sed Rate 6 0 - 22 mm/hr    Comment: Performed at [facility] [address] [place], [place] [address]  I-Stat CG4 Lactic Acid, ED     Status: None   Collection Time:  10/05/17  7:58 PM  Result Value Ref Range   Lactic Acid, Venous 1.11 0.5 - 1.9 mmol/L  Urine rapid drug screen (hosp performed)     Status: Abnormal   Collection Time: 10/05/17 10:49 PM  Result Value Ref Range   Opiates POSITIVE (A) NONE DETECTED   Cocaine NONE DETECTED NONE DETECTED    Benzodiazepines NONE DETECTED NONE DETECTED   Amphetamines NONE DETECTED NONE DETECTED   Tetrahydrocannabinol NONE DETECTED NONE DETECTED   Barbiturates NONE DETECTED NONE DETECTED    Comment: (NOTE) DRUG SCREEN FOR MEDICAL PURPOSES ONLY.  IF CONFIRMATION IS NEEDED FOR ANY PURPOSE, NOTIFY LAB WITHIN 5 DAYS. LOWEST DETECTABLE LIMITS FOR URINE DRUG SCREEN Drug Class                     Cutoff (ng/mL) Amphetamine and metabolites    1000 Barbiturate and metabolites    200 Benzodiazepine                 829 Tricyclics and metabolites     300 Opiates and metabolites        300 Cocaine and metabolites        300 THC                            50 Performed at [facility], [place] [address] [place],  [address]    *Note: Due to a large number of results and/or encounters for the requested time period, some results have not been displayed. A complete set of results can be found in Results Review.   Dg Chest 2 View  Result Date: 10/05/2017 CLINICAL DATA:  Fever for 1 day, history of sickle cell anemia, smoking history EXAM: CHEST - 2 VIEW COMPARISON:  Chest x-ray of 10/05/2016 FINDINGS: No active infiltrate or effusion is seen. Right-sided Port-A-Cath remains with the tip overlying mid SVC. Mediastinal and hilar contours are unremarkable and the heart is mildly enlarged. 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 IMPRESSION: 1. No active lung disease. 2. Port-A-Cath tip overlies the mid SVC. 3. 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 Electronically Signed   By: Ivar Drape M.D.   On: 10/05/2017 17:09   Dg Chest 2 View  Result Date: 10/05/2017 CLINICAL DATA:  Sickle cell crisis. EXAM: CHEST - 2 VIEW COMPARISON:  09/29/2017. FINDINGS: PowerPort catheter with tip over the superior vena cava. Cardiomegaly with mild pulmonary vascular prominence. No focal infiltrate. Low lung volumes. No pleural effusion or pneumothorax.  IMPRESSION: 1.  PowerPort catheter with tip over the superior vena cava. 2. Cardiomegaly with mild pulmonary vascular prominence. Mild right mid lung field subsegmental atelectasis. Electronically Signed   By: Marcello Moores  Register   On: 10/05/2017 09:20    Pending Labs Unresulted Labs (From admission, onward)   Start     Ordered   10/13/17 0500  Creatinine, serum  (enoxaparin (LOVENOX)    CrCl >/= 30 ml/min)  Weekly,   R    Comments:  while on enoxaparin therapy    10/06/17 0002   10/06/17 0500  CBC with Differential/Platelet  Tomorrow morning,   R     10/06/17 0002   10/06/17 0500  Lactate dehydrogenase  Tomorrow morning,   R     10/06/17 0002   10/06/17 0500  Comprehensive metabolic panel  Tomorrow morning,   R     10/06/17 0002  10/06/17 0001  CBC  (enoxaparin (LOVENOX)    CrCl >/= 30 ml/min)  Once,   R    Comments:  Baseline for enoxaparin therapy IF NOT ALREADY DRAWN.  Notify MD if PLT < 100 K.    10/06/17 0002   10/06/17 0001  Creatinine, serum  (enoxaparin (LOVENOX)    CrCl >/= 30 ml/min)  Once,   R    Comments:  Baseline for enoxaparin therapy IF NOT ALREADY DRAWN.    10/06/17 0002   10/05/17 1633  Urine culture  STAT,   STAT     10/05/17 1633   10/05/17 1629  Culture, blood (routine x 2)  BLOOD CULTURE X 2,   STAT     10/05/17 1631      Vitals/Pain Today's Vitals   10/05/17 1921 10/05/17 2025 10/05/17 2158 10/06/17 0015  BP: (!) 109/51 (!) 102/53 (!) 103/54 (!) 101/54  Pulse: 93 (!) 105 (!) 106 99  Resp: 14 18 18 17   Temp:      TempSrc:      SpO2: 100% 100% 99% 94%  Weight:      Height:      PainSc: Asleep Asleep 10-Worst pain ever     Isolation Precautions No active isolations  Medications Medications  ondansetron (ZOFRAN) injection 4 mg (4 mg Intravenous Given 10/05/17 1646)  0.45 % sodium chloride infusion (has no administration in time range)  folic acid (FOLVITE) tablet 1 mg (has no administration in time range)  hydroxyurea (HYDREA) capsule 1,500 mg  (has no administration in time range)  morphine (MS CONTIN) 12 hr tablet 30 mg (has no administration in time range)  senna-docusate (Senokot-S) tablet 1 tablet (has no administration in time range)  polyethylene glycol (MIRALAX / GLYCOLAX) packet 17 g (has no administration in time range)  enoxaparin (LOVENOX) injection 40 mg (has no administration in time range)  ketorolac (TORADOL) 15 MG/ML injection 15 mg (has no administration in time range)  naloxone (NARCAN) injection 0.4 mg (has no administration in time range)    And  sodium chloride flush (NS) 0.9 % injection 9 mL (has no administration in time range)  ondansetron (ZOFRAN) injection 4 mg (has no administration in time range)  diphenhydrAMINE (BENADRYL) capsule 25 mg (has no administration in time range)    Or  diphenhydrAMINE (BENADRYL) 25 mg in sodium chloride 0.9 % 50 mL IVPB (has no administration in time range)  HYDROmorphone (DILAUDID) 1 mg/mL PCA injection (has no administration in time range)  ketorolac (TORADOL) 15 MG/ML injection 15 mg (15 mg Intravenous Given 10/05/17 1645)  HYDROmorphone (DILAUDID) injection 2 mg (2 mg Intravenous Given 10/05/17 1646)    Or  HYDROmorphone (DILAUDID) injection 2 mg ( Subcutaneous See Alternative 10/05/17 1646)  HYDROmorphone (DILAUDID) injection 2 mg (2 mg Intravenous Given 10/05/17 1712)    Or  HYDROmorphone (DILAUDID) injection 2 mg ( Subcutaneous See Alternative 10/05/17 1712)  HYDROmorphone (DILAUDID) injection 2 mg (2 mg Intravenous Given 10/05/17 1826)    Or  HYDROmorphone (DILAUDID) injection 2 mg ( Subcutaneous See Alternative 10/05/17 1826)  diphenhydrAMINE (BENADRYL) injection 25 mg (25 mg Intravenous Given 10/05/17 1646)  sodium chloride 0.9 % bolus 1,000 mL (0 mLs Intravenous Stopped 10/05/17 1922)    And  sodium chloride 0.9 % bolus 500 mL (0 mLs Intravenous Stopped 10/05/17 2025)  cefTRIAXone (ROCEPHIN) 2 g in sodium chloride 0.9 % 100 mL IVPB (0 g Intravenous Stopped 10/05/17 1918)   azithromycin (ZITHROMAX) 500 mg in sodium chloride 0.9 %  250 mL IVPB (0 mg Intravenous Stopped 10/05/17 2025)  HYDROmorphone (DILAUDID) injection 2 mg (2 mg Intravenous Given 10/06/17 0000)    Mobility walks

## 2017-10-07 LAB — URINE CULTURE

## 2017-10-07 MED ORDER — KETOROLAC TROMETHAMINE 30 MG/ML IJ SOLN
30.0000 mg | Freq: Four times a day (QID) | INTRAMUSCULAR | Status: DC
  Administered 2017-10-07 – 2017-10-09 (×8): 30 mg via INTRAVENOUS
  Filled 2017-10-07 (×9): qty 1

## 2017-10-07 NOTE — Progress Notes (Signed)
Subjective: Patient is feeling better today.  Pain is a 8 out of 10. No fever or chills..  Patient not on Toradol.  No nausea vomiting or diarrhea.  Objective: Vital signs in last 24 hours: Temp:  [97.9 F (36.6 C)-99.1 F (37.3 C)] 99.1 F (37.3 C) (05/25 1710) Pulse Rate:  [90-93] 91 (05/25 1710) Resp:  [14-17] 16 (05/25 1710) BP: (119-143)/(62-82) 143/82 (05/25 1710) SpO2:  [94 %-100 %] 100 % (05/25 1710) Weight change:  Last BM Date: 10/06/17  Intake/Output from previous day: 05/24 0701 - 05/25 0700 In: 2172 [P.O.:960; I.V.:1162; IV Piggyback:50] Out: 1100 [Urine:1100] Intake/Output this shift: Total I/O In: -  Out: 750 [Urine:750]  General appearance: alert, cooperative, appears stated age and no distress Back: symmetric, no curvature. ROM normal. No CVA tenderness. Resp: clear to auscultation bilaterally Cardio: regular rate and rhythm, S1, S2 normal, no murmur, click, rub or gallop GI: soft, non-tender; bowel sounds normal; no masses,  no organomegaly Extremities: extremities normal, atraumatic, no cyanosis or edema Pulses: 2+ and symmetric Neurologic: Grossly normal  Lab Results: Recent Labs    10/05/17 1841 10/06/17 0658  WBC 19.6* 17.3*  HGB 7.0* 6.8*  HCT 21.1* 19.8*  PLT 520* 513*   BMET Recent Labs    10/05/17 1841 10/06/17 0658  NA 139 138  K 3.9 3.9  CL 106 105  CO2 26 25  GLUCOSE 92 115*  BUN 7 7  CREATININE 0.75 0.61  CALCIUM 8.7* 8.4*    Studies/Results: No results found.  Medications: I have reviewed the patient's current medications.  Assessment/Plan: A 27 year old female admitted with sickle cell painful crisis  #1 sickle cell painful crisis: patient on weight-based  Dilaudid PCA.  I will add Toradol 30 mg every 6 hours.Continue treatment.    #2 sickle cell anemia: H&H is stable.  Continue current treatment  #3  Polysubstance abuse: counseling provided.   No change in therapy.  #4 leukocytosis: monitor white cell count.   Slightly improved.  Antibiotic is discontinued   LOS: 2 days   Ashan Cueva,LAWAL 10/07/2017, 6:29 PM

## 2017-10-08 LAB — CREATININE, SERUM
CREATININE: 0.55 mg/dL (ref 0.44–1.00)
GFR calc Af Amer: >60 mL/min (ref >60)

## 2017-10-08 LAB — CBC WITH DIFFERENTIAL/PLATELET
BASOS PCT: 0 %
Basophils Absolute: 0 10*3/uL (ref 0.0–0.1)
Eosinophils Absolute: 0.7 10*3/uL (ref 0.0–0.7)
Eosinophils Relative: 7 %
HEMATOCRIT: 18.9 % — AB (ref 36.0–46.0)
HEMOGLOBIN: 6.2 g/dL — AB (ref 12.0–15.0)
LYMPHS PCT: 20 %
Lymphs Abs: 2 10*3/uL (ref 0.7–4.0)
MCH: 31.2 pg (ref 26.0–34.0)
MCHC: 32.8 g/dL (ref 30.0–36.0)
MCV: 95 fL (ref 78.0–100.0)
MONOS PCT: 4 %
Monocytes Absolute: 0.4 10*3/uL (ref 0.1–1.0)
NEUTROS ABS: 6.8 10*3/uL (ref 1.7–7.7)
Neutrophils Relative %: 69 %
Platelets: 452 10*3/uL — ABNORMAL HIGH (ref 150–400)
RBC: 1.99 MIL/uL — ABNORMAL LOW (ref 3.87–5.11)
RDW: 20.5 % — ABNORMAL HIGH (ref 11.5–15.5)
WBC: 9.9 10*3/uL (ref 4.0–10.5)

## 2017-10-08 MED ORDER — POLYETHYLENE GLYCOL 3350 17 G PO PACK
17.0000 g | PACK | Freq: Every day | ORAL | Status: DC
  Filled 2017-10-08 (×2): qty 1

## 2017-10-08 NOTE — Progress Notes (Signed)
Subjective: Patient is improved with pain at 5 out of 10.  Patient has been able to walk in her room on hallway.   Heart goal.  Pain is  Less than 5 out of 10.  No fever or chills.  No nausea vomiting or diarrhea.  Objective: Vital signs in last 24 hours: Temp:  [97.9 F (36.6 C)-99.1 F (37.3 C)] 97.9 F (36.6 C) (05/26 1752) Pulse Rate:  [80-96] 92 (05/26 1752) Resp:  [14-17] 16 (05/26 1752) BP: (103-135)/(61-85) 124/72 (05/26 1752) SpO2:  [95 %-99 %] 98 % (05/26 1752) Weight change:  Last BM Date: 10/06/17  Intake/Output from previous day: 05/25 0701 - 05/26 0700 In: -  Out: 2950 [Urine:2950] Intake/Output this shift: Total I/O In: -  Out: 1600 [Urine:1600]  General appearance: alert, cooperative, appears stated age and no distress Back: symmetric, no curvature. ROM normal. No CVA tenderness. Resp: clear to auscultation bilaterally Cardio: regular rate and rhythm, S1, S2 normal, no murmur, click, rub or gallop GI: soft, non-tender; bowel sounds normal; no masses,  no organomegaly Extremities: extremities normal, atraumatic, no cyanosis or edema Pulses: 2+ and symmetric Neurologic: Grossly normal  Lab Results: Recent Labs    10/06/17 0658 10/08/17 0813  WBC 17.3* 9.9  HGB 6.8* 6.2*  HCT 19.8* 18.9*  PLT 513* 452*   BMET Recent Labs    10/05/17 1841 10/06/17 0658 10/08/17 0813  NA 139 138  --   K 3.9 3.9  --   CL 106 105  --   CO2 26 25  --   GLUCOSE 92 115*  --   BUN 7 7  --   CREATININE 0.75 0.61 0.55  CALCIUM 8.7* 8.4*  --     Studies/Results: No results found.  Medications: I have reviewed the patient's current medications.  Assessment/Plan: A 27 year old female admitted with sickle cell painful crisis  #1 sickle cell painful crisis: Patient on weight-based  Dilaudid PCA.  Also on Toradol 30 mg every 6 hours.Continue treatment.  Patient on MS Contin as well.  Prepare patient for discharge possibly tomorr  #2 sickle cell anemia: H&H is stable.   Continue current treatment  #3  Polysubstance abuse: counseling provided.   No change in therapy.  #4 leukocytosis: Resolved. Monitor white cell count.    LOS: 3 days   GARBA,LAWAL 10/08/2017, 6:20 PM

## 2017-10-09 IMAGING — CR DG CHEST 2V
2 series · 2 of 2 positions shown · non-contrast
Comparison: Chest radiograph June 26, 2016

CLINICAL DATA: Back and chest pain, cough for 3 days. History of
sickle cell.

EXAM:
CHEST  2 VIEW

[w chest pa]
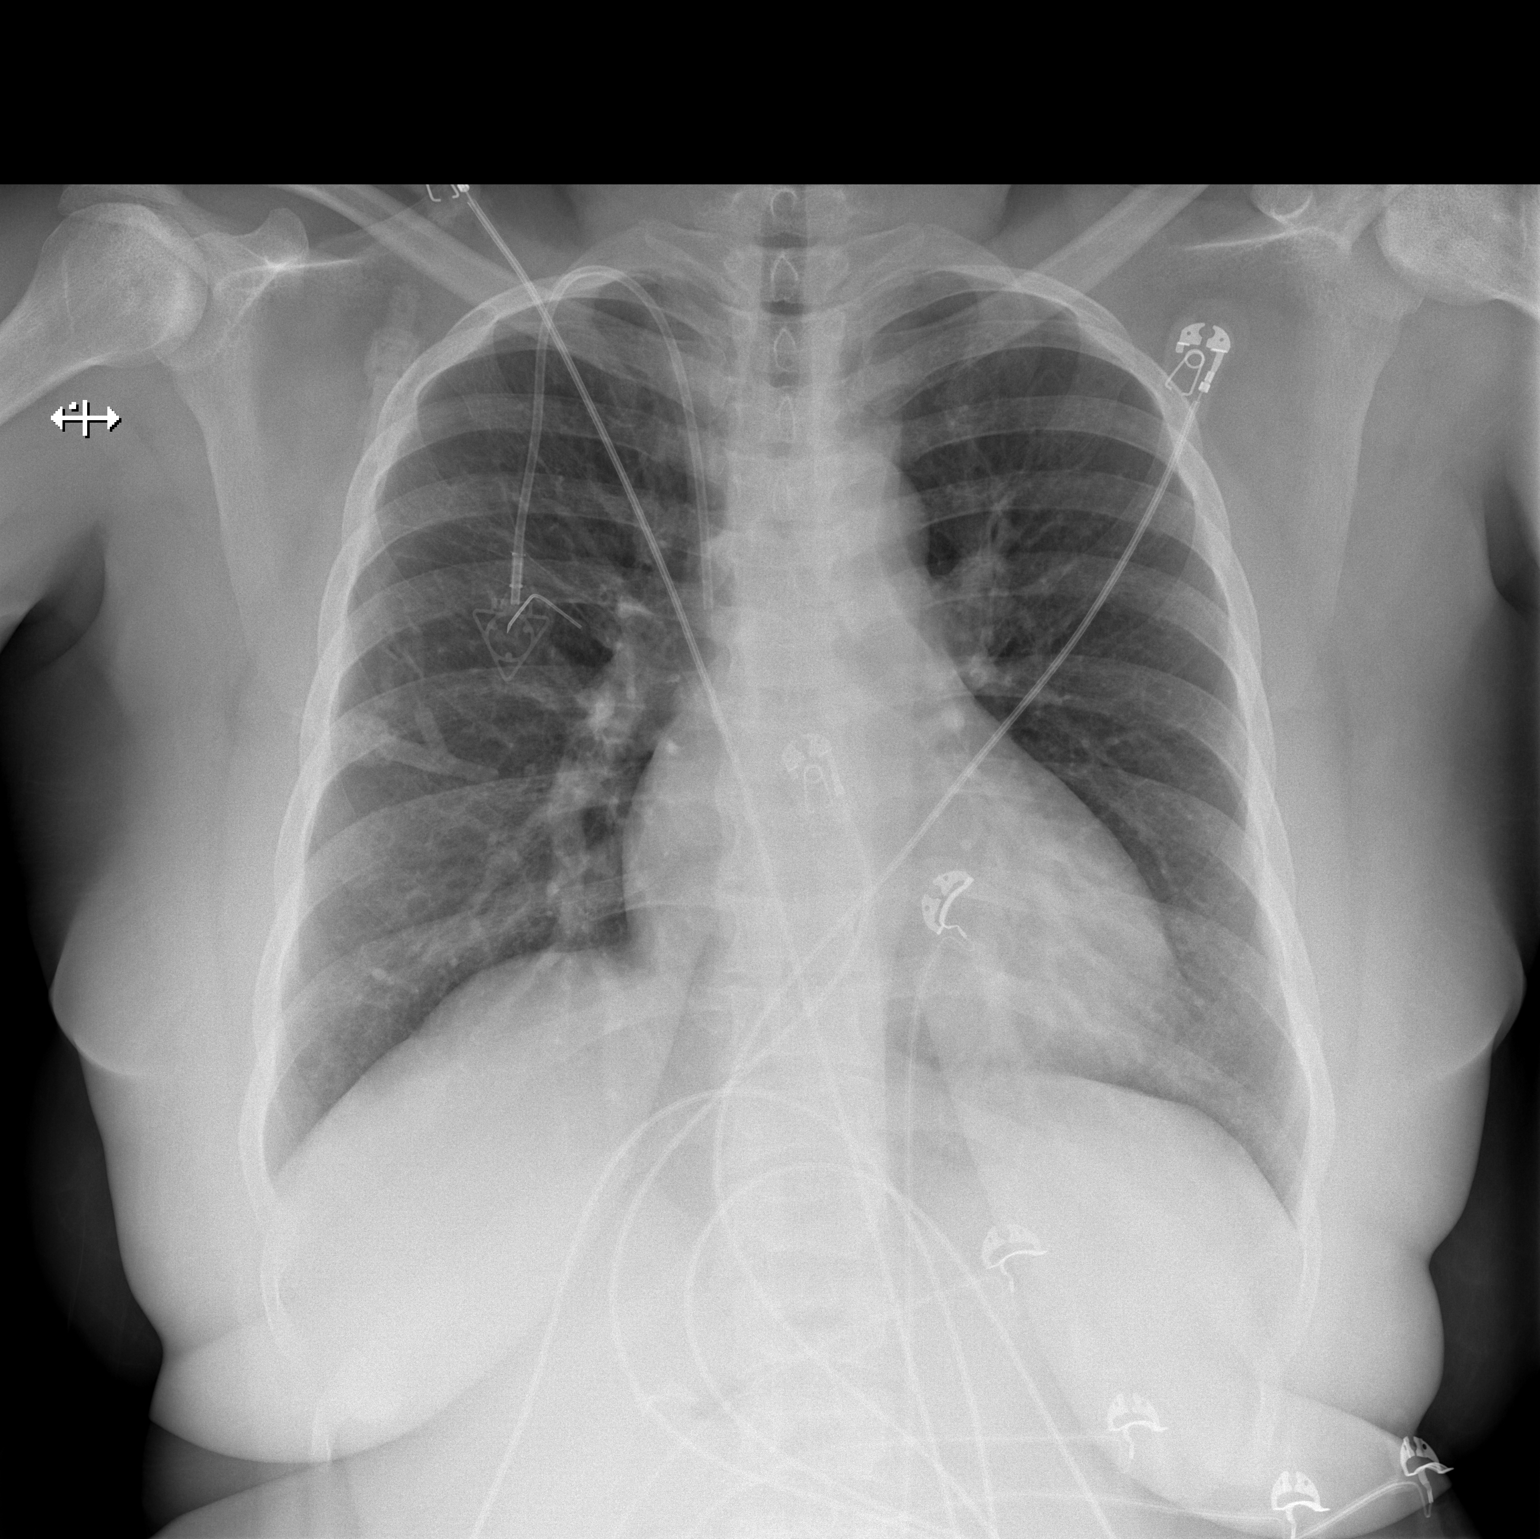

[w chest lat]
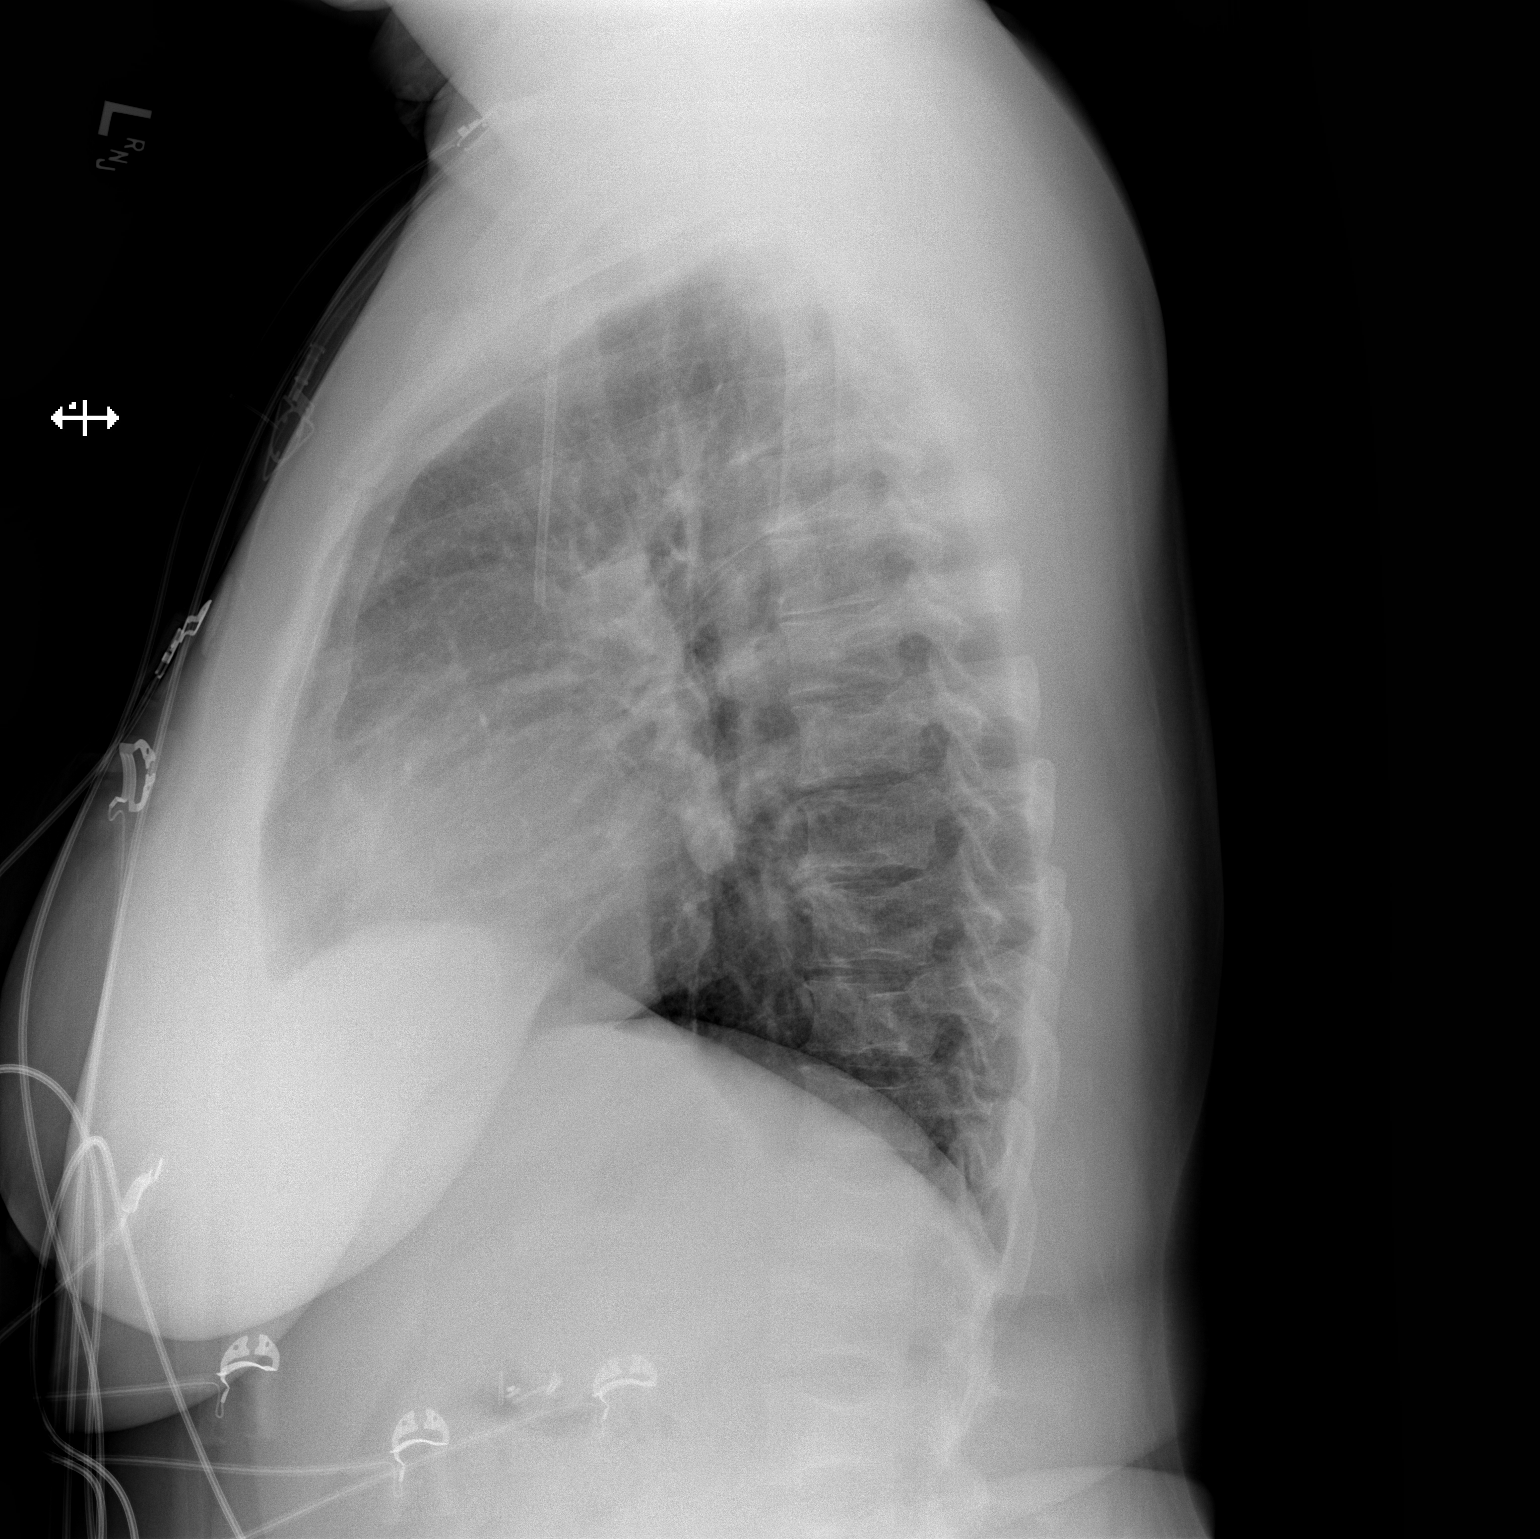

[2 of 2 positions shown; findings below may reference images not displayed]

FINDINGS: Cardiac silhouette is mildly enlarged, mediastinal silhouette is
unremarkable. No pleural effusion focal consolidation. No
pneumothorax. Single lumen RIGHT chest Port-A-Cath with catheter
components projecting below the port. No pneumothorax. Sclerotic
humeral heads consistent with avascular necrosis. H-shaped vertebral
bodies. Surgical clips in the included right abdomen compatible with
cholecystectomy.
IMPRESSION: Mild cardiomegaly, no acute pulmonary process.

Chronic changes of sickle cell anemia.

## 2017-10-09 MED ORDER — HEPARIN SOD (PORK) LOCK FLUSH 100 UNIT/ML IV SOLN
10.0000 [IU] | Freq: Once | INTRAVENOUS | Status: DC
  Filled 2017-10-09: qty 5

## 2017-10-09 NOTE — Discharge Summary (Signed)
Physician Discharge Summary  Patient ID: [name] MRN: [id number] DOB/AGE: 02/03/1991 26 y.o.  Admit date: 10/05/2017 Discharge date: 10/09/2017  Admission Diagnoses:  Discharge Diagnoses:  Principal Problem:   Sickle cell pain crisis [facility]) Active Problems:   Anemia of chronic disease   Discharged Condition: good  Hospital Course: patient was admitted with sickle cell painful crisis and possible SIRS. She's had recurrent hospitalization due to sickle cell crisis triggered by cocaine abuse. She was admitted this time around and started on Dilaudid PCA, Toradol and IV fluids. Patient initially was on IV antibiotics empirically which was discontinued as there was no evidence of infection. Pain was controlled and patient transition to her home regimen. She was discharged home to follow-up with PCP.  Consults: None  Significant Diagnostic Studies: labs: CBCs and CMP is where checked. No blood transfusion was desired  Treatments: IV hydration, antibiotics: ceftriaxone and analgesia: acetaminophen and Dilaudid  Discharge Exam: Blood pressure 123/70, pulse 87, temperature 98.6 F (37 C), temperature source Oral, resp. rate 14, height 5\' 1"  (1.549 m), weight 93.6 kg (206 lb 5.6 oz), last menstrual period 09/12/2017, SpO2 97 %, not currently breastfeeding. General appearance: alert, cooperative, appears stated age and no distress Resp: clear to auscultation bilaterally Cardio: regular rate and rhythm, S1, S2 normal, no murmur, click, rub or gallop GI: soft, non-tender; bowel sounds normal; no masses,  no organomegaly Extremities: extremities normal, atraumatic, no cyanosis or edema Pulses: 2+ and symmetric Neurologic: Grossly normal  Disposition:    Allergies as of 10/09/2017      Reactions   Carrot [daucus Carota] Hives, Swelling   Allergic to carrots   Latex Rash      Medication List    TAKE these medications   aspirin-acetaminophen-caffeine 250-250-65 MG  tablet Commonly known as:  EXCEDRIN MIGRAINE Take 2 tablets every 6 (six) hours as needed by mouth for headache.   cyclobenzaprine 10 MG tablet Commonly known as:  FLEXERIL Take 1 tablet (10 mg total) by mouth 3 (three) times daily as needed for muscle spasms.   folic acid 1 MG tablet Commonly known as:  FOLVITE Take 1 mg by mouth daily.   gabapentin 300 MG capsule Commonly known as:  NEURONTIN Take 1 capsule (300 mg total) by mouth 3 (three) times daily.   hydroxyurea 500 MG capsule Commonly known as:  HYDREA Take 2 tabs (1000 mg ) PO QOD starting on 06/02/2017 alternating with 1 tab (500 mg) PO QOD starting on 06/03/2017. May take with food to minimize GI side effects. What changed:    how much to take  how to take this  when to take this  additional instructions   morphine 30 MG 12 hr tablet Commonly known as:  MS CONTIN Take 1 tablet (30 mg total) by mouth every 12 (twelve) hours for 7 days.   Oxycodone HCl 20 MG Tabs Take 1 tablet (20 mg total) by mouth every 4 (four) hours as needed for up to 7 days (pain).        SignedBarbette Merino 10/09/2017, 7:32 AM   Time spent 34 minutes

## 2017-10-10 LAB — CULTURE, BLOOD (ROUTINE X 2)
CULTURE: NO GROWTH
Culture: NO GROWTH
Special Requests: ADEQUATE

## 2017-10-18 ENCOUNTER — Encounter: Payer: Self-pay | Admitting: Family Medicine

## 2017-10-18 ENCOUNTER — Ambulatory Visit (INDEPENDENT_AMBULATORY_CARE_PROVIDER_SITE_OTHER): Payer: Medicare Other | Admitting: Family Medicine

## 2017-10-18 VITALS — BP 104/72 | HR 90 | Temp 99.1°F | Ht 61.0 in | Wt 207.0 lb

## 2017-10-18 DIAGNOSIS — D571 Sickle-cell disease without crisis: Secondary | ICD-10-CM

## 2017-10-18 DIAGNOSIS — G894 Chronic pain syndrome: Secondary | ICD-10-CM

## 2017-10-18 DIAGNOSIS — F419 Anxiety disorder, unspecified: Secondary | ICD-10-CM

## 2017-10-18 DIAGNOSIS — F1190 Opioid use, unspecified, uncomplicated: Secondary | ICD-10-CM

## 2017-10-18 DIAGNOSIS — Z09 Encounter for follow-up examination after completed treatment for conditions other than malignant neoplasm: Secondary | ICD-10-CM

## 2017-10-18 LAB — POCT URINALYSIS DIP (MANUAL ENTRY)
Bilirubin, UA: NEGATIVE
Blood, UA: NEGATIVE
Glucose, UA: NEGATIVE mg/dL
Ketones, POC UA: NEGATIVE mg/dL
Leukocytes, UA: NEGATIVE
Nitrite, UA: NEGATIVE
Protein Ur, POC: NEGATIVE mg/dL
Spec Grav, UA: <=1.005 — AB (ref 1.010–1.025)
Urobilinogen, UA: 0.2 E.U./dL
pH, UA: 6 (ref 5.0–8.0)

## 2017-10-18 MED ORDER — MORPHINE SULFATE ER 30 MG PO TBCR: 30.0000 mg | EXTENDED_RELEASE_TABLET | Freq: Two times a day (BID) | ORAL | 0 refills | Status: DC

## 2017-10-18 MED ORDER — OXYCODONE HCL 20 MG PO TABS: 20.0000 mg | ORAL_TABLET | Freq: Four times a day (QID) | ORAL | 0 refills | Status: AC | PRN

## 2017-10-18 MED ORDER — OXYCODONE HCL 20 MG PO TABS: 20.0000 mg | ORAL_TABLET | Freq: Four times a day (QID) | ORAL | 0 refills | Status: DC | PRN

## 2017-10-18 MED FILL — oxyCODONE HCL 20 MG TABS: 20 | 15 days supply | Qty: 60 | Fill #0

## 2017-10-18 MED FILL — MORPHINE SULF ER 30 MG TAB: 30 | 30 days supply | Qty: 60 | Fill #0

## 2017-10-22 NOTE — Progress Notes (Signed)
Subjective:    Patient ID: [name], female    DOB: [birth year], 27 y.o.   MRN: [id number]   PCP: Kathe Becton, NP  Chief Complaint  Patient presents with  . Hospitalization Follow-up    HPI  [name] has history of Sickle Cell Anemia, Migraines, Mood Swings, GERD, Depression, Chronic Back Pain, and Thrombocytosis. She is here today to re-establish care at our clinic and hospital follow up.   Current Status: She is a previous patient of Dr. Zigmund Daniel and was then transferred to Dr. Noland Fordyce care, and afterwards, she received pain management at [facility]. She states that their management of her pain was not affective. She previously spoke to Dr. Doreene Burke while hospitalized, and has agreed to the terms to receive ongoing treatment from our office.   She is currently taking MS Contin 30 mg tablets, every 12 hours and Oxycodone 20 mg tablets every 6 hours as needed for pain. She also takes Hydroxyurea, Flexeril, and Neurontin.  She reports having pain in her knees, ankles, elbows, arms, shoulders, and hips when she has Sickle Cell Crisis. She is stable today and in no acute pain distress. She denies cough, chest pain, shortness of breath, and heart palpitations.   She denies headache, visual changes, dizziness and falls. She has occasional nausea. She denies any GI symptoms at this time.   She has mild anxiety.  Past Medical History:  Diagnosis Date  . Blood transfusion    "lots"  . Blood transfusion without reported diagnosis   . Chronic back pain    "very severe; have knot in my back; from tight muscle; take RX and exercise for it"  . Depression 01/06/2011  . Exertional dyspnea    "sometimes"  . Genital HSV   . GERD (gastroesophageal reflux disease) 02/17/2011  . Migraines 11/08/11   "@ least twice/month"  . 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  . Mood swings 11/08/11   "I go back and forth; real bad"  . Sickle cell anemia ([name])   . Sickle  cell anemia with crisis ([place])   . Thrombocytosis ([place]) 11/09/2015  . Trichotillomania    h/o    Family History  Problem Relation Age of Onset  . Sickle cell trait Mother   . Sickle cell trait Father   . Diabetes Maternal Grandmother   . Diabetes Paternal Grandmother   . Hypertension Paternal Grandmother   . Diabetes Maternal Grandfather     Social History   Socioeconomic History  . Marital status: Single    Spouse name: Not on file  . Number of children: Not on file  . Years of education: Not on file  . Highest education level: Not on file  Occupational History  . Not on file  Social Needs  . Financial resource strain: Not on file  . Food insecurity:    Worry: Not on file    Inability: Not on file  . Transportation needs:    Medical: Not on file    Non-medical: Not on file  Tobacco Use  . Smoking status: Current Some Day Smoker    Packs/day: 0.25    Years: 1.00    Pack years: 0.25    Types: Cigarettes    Last attempt to quit: 03/25/2013    Years since quitting: 4.5  . Smokeless tobacco: Never Used  Substance and Sexual Activity  . Alcohol use: No    Comment: denies  . Drug use: No    Types:  Other-see comments    Comment: "Clean for 3 months"  . Sexual activity: Yes    Partners: Male    Birth control/protection: None    Comment: last sex Jul 11 2015  Lifestyle  . Physical activity:    Days per week: Not on file    Minutes per session: Not on file  . Stress: Not on file  Relationships  . Social connections:    Talks on phone: Not on file    Gets together: Not on file    Attends religious service: Not on file    Active member of club or organization: Not on file    Attends meetings of clubs or organizations: Not on file    Relationship status: Not on file  . Intimate partner violence:    Fear of current or ex partner: Not on file    Emotionally abused: Not on file    Physically abused: Not on file    Forced sexual activity: Not on file  Other Topics  Concern  . 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 CHOLECYSTECTOMY  05/2010  . DILATION AND CURETTAGE OF UTERUS  02/20/11   S/P miscarriage  . IR GENERIC HISTORICAL  12/23/2015   IR FLUORO GUIDE CV LINE RIGHT 12/23/2015 Jacqulynn Cadet, MD [facility]  . IR GENERIC HISTORICAL  12/23/2015   IR US GUIDE VASC ACCESS RIGHT 12/23/2015 Jacqulynn Cadet, MD [facility]    Immunization History  Administered Date(s) Administered  . Influenza,inj,Quad PF,6+ Mos 02/26/2013, 02/19/2014  . MMR 12/10/2014  . Pneumococcal-Unspecified 02/14/2011  . Tdap 12/10/2014, 08/11/2015    Current Meds  Medication Sig  . aspirin-acetaminophen-caffeine (EXCEDRIN MIGRAINE) 250-250-65 MG tablet Take 2 tablets every 6 (six) hours as needed by mouth for headache.  . folic acid (FOLVITE) 1 MG tablet Take 1 mg by mouth daily.  . hydroxyurea (HYDREA) 500 MG capsule Take 2 tabs (1000 mg ) PO QOD starting on 06/02/2017 alternating with 1 tab (500 mg) PO QOD starting on 06/03/2017. May take with food to minimize GI side effects. (Patient taking differently: Take 1,500 mg by mouth daily. May take with food to minimize GI side effects.)    Allergies  Allergen Reactions  . Carrot [Daucus Carota] Hives and Swelling    Allergic to carrots  . Latex Rash    BP 104/72 (BP Location: Left Arm, Patient Position: Sitting, Cuff Size: Large)   Pulse 90   Temp 99.1 F (37.3 C) (Oral)   Ht 5' 1"  (1.549 m)   Wt 207 lb (93.9 kg)   LMP 09/26/2017   SpO2 95%   BMI 39.11 kg/m    Review of Systems  Constitutional: Negative.   HENT: Negative.   Eyes: Negative.   Respiratory: Negative.   Cardiovascular: Negative.   Gastrointestinal: Negative.   Endocrine: Negative.   Genitourinary: Negative.   Musculoskeletal:       Minimal joint pain today.   Skin: Negative.    Allergic/Immunologic: Negative.   Neurological: Negative.   Hematological: Negative.   Psychiatric/Behavioral: Negative.        Objective:   Physical Exam  Constitutional: She is oriented to person, place, and time. She appears well-developed and well-nourished.  HENT:  Head: Normocephalic and atraumatic.  Right Ear: External ear normal.  Left Ear:  External ear normal.  Nose: Nose normal.  Mouth/Throat: Oropharynx is clear and moist.  Eyes: Pupils are equal, round, and reactive to light. Conjunctivae and EOM are normal.  Neck: Normal range of motion. Neck supple.  Cardiovascular: Normal rate, regular rhythm, normal heart sounds and intact distal pulses.  Pulmonary/Chest: Effort normal and breath sounds normal.  Abdominal: Soft. Bowel sounds are normal.  Musculoskeletal: Normal range of motion.  Neurological: She is alert and oriented to person, place, and time.  Skin: Skin is warm and dry. Capillary refill takes less than 2 seconds.  Psychiatric: She has a normal mood and affect. Her behavior is normal. Judgment and thought content normal.  Nursing note and vitals reviewed.   Assessment & Plan:   1. Hb-SS disease without crisis [facility]) She has re-established care with [place] today.  She has agreed to terms of Pain Contract today.  She will receive refills on MS Contin and Oxycodone today.  - POCT urinalysis dipstick - [id number] 9+OXYCODONE+CRT-UNBUND - morphine (MS CONTIN) 30 MG 12 hr tablet; Take 1 tablet (30 mg total) by mouth every 12 (twelve) hours.  Dispense: 60 tablet; Refill: 0 - Oxycodone HCl 20 MG TABS; Take 1 tablet (20 mg total) by mouth every 6 (six) hours as needed for up to 15 days.  Dispense: 60 tablet; Refill: 0  2. Chronic pain syndrome Stable today.   3. Chronic, continuous use of opioids She has agreed to conditions noted in signed Pain Contract for continued medical treatment of Sickle Cell Disease today. This includes: - Drug tested monthly basis - She will  receive Rx for 1 month's supply of MS-Contin. - She will receive Rx for 15-day supply of Oxycodone. - No written Rxs for pain medications until 5 days after hospital discharge.   4. Anxiety Stable today.   5. Follow up She will follow up in 1 month.   Meds ordered this encounter  Medications  . DISCONTD: Oxycodone HCl 20 MG TABS    Sig: Take 1 tablet (20 mg total) by mouth every 6 (six) hours as needed for up to 15 days.    Dispense:  60 tablet    Refill:  0  . DISCONTD: morphine (MS CONTIN) 30 MG 12 hr tablet    Sig: Take 1 tablet (30 mg total) by mouth every 12 (twelve) hours.    Dispense:  60 tablet    Refill:  0    Order Specific Question:   Supervising Provider    Answer:   Tresa Garter [id number]  . morphine (MS CONTIN) 30 MG 12 hr tablet    Sig: Take 1 tablet (30 mg total) by mouth every 12 (twelve) hours.    Dispense:  60 tablet    Refill:  0  . Oxycodone HCl 20 MG TABS    Sig: Take 1 tablet (20 mg total) by mouth every 6 (six) hours as needed for up to 15 days.    Dispense:  60 tablet    Refill:  0    Kathe Becton,  MSN, FNP-BC Patient [facility] [address] [place], [place] [address] [phone]

## 2017-10-27 LAB — OPIATES CONFIRMATION, URINE
Codeine: NEGATIVE
Hydrocodone: NEGATIVE
Hydromorphone: NEGATIVE
MORPHINE CONFIRM: 853 ng/mL
Morphine: POSITIVE — AB
Opiates: POSITIVE ng/mL — AB

## 2017-10-27 LAB — 737588 9+OXYCODONE+CRT-UNBUND
Amphetamine Scrn, Ur: NEGATIVE ng/mL
BARBITURATE SCREEN URINE: NEGATIVE ng/mL
BENZODIAZEPINE SCREEN, URINE: NEGATIVE ng/mL
CANNABINOIDS UR QL SCN: NEGATIVE ng/mL
Creatinine(Crt), U: 14.2 mg/dL — ABNORMAL LOW (ref 20.0–300.0)
Methadone Screen, Urine: NEGATIVE ng/mL
OXYCODONE+OXYMORPHONE UR QL SCN: NEGATIVE ng/mL
Ph of Urine: 6.3 (ref 4.5–8.9)
Phencyclidine Qn, Ur: NEGATIVE ng/mL
Propoxyphene Scrn, Ur: NEGATIVE ng/mL

## 2017-10-27 LAB — COCAINE (GC/MS), URINE
BENZOYLECGONINE (GC/MS): 584 ng/mL
COCAINE + METABOLITE: POSITIVE — AB

## 2017-10-27 LAB — SPECIFIC GRAVITY (REFLEXED): SPECIFIC GRAVITY: 1.0021

## 2017-11-12 ENCOUNTER — Encounter: Payer: Self-pay | Admitting: Family Medicine

## 2017-11-19 ENCOUNTER — Emergency Department (HOSPITAL_COMMUNITY): Payer: Medicare Other

## 2017-11-19 ENCOUNTER — Encounter (HOSPITAL_COMMUNITY): Payer: Self-pay

## 2017-11-19 ENCOUNTER — Other Ambulatory Visit: Payer: Self-pay

## 2017-11-19 ENCOUNTER — Inpatient Hospital Stay (HOSPITAL_COMMUNITY)
Admission: EM | Admit: 2017-11-19 | Discharge: 2017-11-23 | DRG: 812 | Disposition: A | Discharge status: Home / Self Care | Payer: Medicare Other | Attending: Internal Medicine | Admitting: Internal Medicine

## 2017-11-19 DIAGNOSIS — G894 Chronic pain syndrome: Secondary | ICD-10-CM | POA: Diagnosis present

## 2017-11-19 DIAGNOSIS — Z91040 Latex allergy status: Secondary | ICD-10-CM

## 2017-11-19 DIAGNOSIS — Z832 Family history of diseases of the blood and blood-forming organs and certain disorders involving the immune mechanism: Secondary | ICD-10-CM | POA: Diagnosis not present

## 2017-11-19 DIAGNOSIS — Z91018 Allergy to other foods: Secondary | ICD-10-CM

## 2017-11-19 DIAGNOSIS — D638 Anemia in other chronic diseases classified elsewhere: Secondary | ICD-10-CM | POA: Diagnosis present

## 2017-11-19 DIAGNOSIS — D5700 Hb-SS disease with crisis, unspecified: Secondary | ICD-10-CM | POA: Diagnosis not present

## 2017-11-19 DIAGNOSIS — K219 Gastro-esophageal reflux disease without esophagitis: Secondary | ICD-10-CM | POA: Diagnosis present

## 2017-11-19 DIAGNOSIS — F329 Major depressive disorder, single episode, unspecified: Secondary | ICD-10-CM | POA: Diagnosis present

## 2017-11-19 DIAGNOSIS — Z86718 Personal history of other venous thrombosis and embolism: Secondary | ICD-10-CM | POA: Diagnosis not present

## 2017-11-19 DIAGNOSIS — R52 Pain, unspecified: Secondary | ICD-10-CM | POA: Diagnosis not present

## 2017-11-19 DIAGNOSIS — D571 Sickle-cell disease without crisis: Secondary | ICD-10-CM | POA: Diagnosis present

## 2017-11-19 DIAGNOSIS — D72829 Elevated white blood cell count, unspecified: Secondary | ICD-10-CM | POA: Diagnosis present

## 2017-11-19 DIAGNOSIS — G8929 Other chronic pain: Secondary | ICD-10-CM

## 2017-11-19 DIAGNOSIS — F1910 Other psychoactive substance abuse, uncomplicated: Secondary | ICD-10-CM | POA: Diagnosis not present

## 2017-11-19 DIAGNOSIS — A6000 Herpesviral infection of urogenital system, unspecified: Secondary | ICD-10-CM | POA: Diagnosis present

## 2017-11-19 DIAGNOSIS — D75839 Thrombocytosis, unspecified: Secondary | ICD-10-CM | POA: Diagnosis present

## 2017-11-19 DIAGNOSIS — R7989 Other specified abnormal findings of blood chemistry: Secondary | ICD-10-CM | POA: Diagnosis present

## 2017-11-19 DIAGNOSIS — Z79899 Other long term (current) drug therapy: Secondary | ICD-10-CM

## 2017-11-19 DIAGNOSIS — R1110 Vomiting, unspecified: Secondary | ICD-10-CM | POA: Diagnosis present

## 2017-11-19 DIAGNOSIS — F17210 Nicotine dependence, cigarettes, uncomplicated: Secondary | ICD-10-CM | POA: Diagnosis present

## 2017-11-19 DIAGNOSIS — D473 Essential (hemorrhagic) thrombocythemia: Secondary | ICD-10-CM

## 2017-11-19 DIAGNOSIS — Z7982 Long term (current) use of aspirin: Secondary | ICD-10-CM

## 2017-11-19 LAB — COMPREHENSIVE METABOLIC PANEL
ALBUMIN: 4.6 g/dL (ref 3.5–5.0)
ALK PHOS: 83 U/L (ref 38–126)
ALT: 15 U/L (ref 0–44)
ANION GAP: 9 (ref 5–15)
AST: 25 U/L (ref 15–41)
BILIRUBIN TOTAL: 3.7 mg/dL — AB (ref 0.3–1.2)
BUN: 9 mg/dL (ref 6–20)
CALCIUM: 9 mg/dL (ref 8.9–10.3)
CO2: 27 mmol/L (ref 22–32)
Chloride: 103 mmol/L (ref 98–111)
Creatinine, Ser: 0.75 mg/dL (ref 0.44–1.00)
GFR calc Af Amer: >60 mL/min (ref >60)
GFR calc non Af Amer: >60 mL/min (ref >60)
GLUCOSE: 120 mg/dL — AB (ref 70–99)
POTASSIUM: 3.6 mmol/L (ref 3.5–5.1)
SODIUM: 139 mmol/L (ref 135–145)
TOTAL PROTEIN: 7.9 g/dL (ref 6.5–8.1)

## 2017-11-19 LAB — CBC WITH DIFFERENTIAL/PLATELET
Band Neutrophils: 3 %
Basophils Absolute: 0 K/uL (ref 0.0–0.1)
Basophils Relative: 0 %
Blasts: 0 %
Eosinophils Absolute: 0.3 K/uL (ref 0.0–0.7)
Eosinophils Relative: 1 %
HCT: 22.6 % — ABNORMAL LOW (ref 36.0–46.0)
Hemoglobin: 7.8 g/dL — ABNORMAL LOW (ref 12.0–15.0)
Lymphocytes Relative: 5 %
Lymphs Abs: 1.3 K/uL (ref 0.7–4.0)
MCH: 30.8 pg (ref 26.0–34.0)
MCHC: 34.5 g/dL (ref 30.0–36.0)
MCV: 89.3 fL (ref 78.0–100.0)
Metamyelocytes Relative: 2 %
Monocytes Absolute: 2.9 K/uL — ABNORMAL HIGH (ref 0.1–1.0)
Monocytes Relative: 11 %
Myelocytes: 3 %
Neutro Abs: 21.8 K/uL — ABNORMAL HIGH (ref 1.7–7.7)
Neutrophils Relative %: 74 %
Other: 0 %
Platelets: 521 K/uL — ABNORMAL HIGH (ref 150–400)
Promyelocytes Relative: 1 %
RBC: 2.53 MIL/uL — ABNORMAL LOW (ref 3.87–5.11)
RDW: 19.9 % — ABNORMAL HIGH (ref 11.5–15.5)
WBC: 26.3 K/uL — ABNORMAL HIGH (ref 4.0–10.5)
nRBC: 0 /100{WBCs}

## 2017-11-19 LAB — RETICULOCYTES
RBC.: 2.53 MIL/uL — ABNORMAL LOW (ref 3.87–5.11)
Retic Count, Absolute: 538.9 10*3/uL — ABNORMAL HIGH (ref 19.0–186.0)
Retic Ct Pct: 21.3 % — ABNORMAL HIGH (ref 0.4–3.1)

## 2017-11-19 LAB — I-STAT BETA HCG BLOOD, ED (MC, WL, AP ONLY): I-stat hCG, quantitative: <5 m[IU]/mL

## 2017-11-19 MED ORDER — HYDROMORPHONE HCL 2 MG/ML IJ SOLN
2.0000 mg | INTRAMUSCULAR | Status: AC
  Filled 2017-11-19: qty 1

## 2017-11-19 MED ORDER — ENOXAPARIN SODIUM 40 MG/0.4ML ~~LOC~~ SOLN
40.0000 mg | SUBCUTANEOUS | Status: DC
Start: 2017-11-19 — End: 2017-11-23

## 2017-11-19 MED ORDER — FOLIC ACID 1 MG PO TABS
1.0000 mg | ORAL_TABLET | Freq: Every day | ORAL | Status: DC
  Administered 2017-11-19 – 2017-11-23 (×5): 1 mg via ORAL
  Filled 2017-11-19 (×5): qty 1

## 2017-11-19 MED ORDER — POLYETHYLENE GLYCOL 3350 17 G PO PACK: 17.0000 g | PACK | Freq: Every day | ORAL | Status: DC | PRN

## 2017-11-19 MED ORDER — KETOROLAC TROMETHAMINE 30 MG/ML IJ SOLN
30.0000 mg | INTRAMUSCULAR | Status: AC
  Administered 2017-11-19: 30 mg via INTRAVENOUS
  Filled 2017-11-19: qty 1

## 2017-11-19 MED ORDER — HYDROMORPHONE HCL 2 MG/ML IJ SOLN
2.0000 mg | INTRAMUSCULAR | Status: AC
  Administered 2017-11-19: 2 mg via INTRAVENOUS

## 2017-11-19 MED ORDER — KETOROLAC TROMETHAMINE 30 MG/ML IJ SOLN
30.0000 mg | Freq: Four times a day (QID) | INTRAMUSCULAR | Status: DC
  Administered 2017-11-19 – 2017-11-23 (×15): 30 mg via INTRAVENOUS
  Filled 2017-11-19 (×15): qty 1

## 2017-11-19 MED ORDER — ASPIRIN-ACETAMINOPHEN-CAFFEINE 250-250-65 MG PO TABS
2.0000 | ORAL_TABLET | Freq: Four times a day (QID) | ORAL | Status: DC | PRN
  Filled 2017-11-19: qty 2

## 2017-11-19 MED ORDER — ONDANSETRON HCL 4 MG/2ML IJ SOLN: 4.0000 mg | Freq: Four times a day (QID) | INTRAMUSCULAR | Status: DC | PRN

## 2017-11-19 MED ORDER — HYDROXYUREA 500 MG PO CAPS
1500.0000 mg | ORAL_CAPSULE | Freq: Every day | ORAL | Status: DC
  Administered 2017-11-19 – 2017-11-23 (×5): 1500 mg via ORAL
  Filled 2017-11-19 (×5): qty 3

## 2017-11-19 MED ORDER — DIPHENHYDRAMINE HCL 25 MG PO CAPS: 25.0000 mg | ORAL_CAPSULE | ORAL | Status: DC | PRN

## 2017-11-19 MED ORDER — HYDROMORPHONE HCL 2 MG/ML IJ SOLN: 2.0000 mg | INTRAMUSCULAR | Status: AC

## 2017-11-19 MED ORDER — DEXTROSE-NACL 5-0.45 % IV SOLN
INTRAVENOUS | Status: DC
  Administered 2017-11-19 – 2017-11-22 (×5): via INTRAVENOUS

## 2017-11-19 MED ORDER — SODIUM CHLORIDE 0.9% FLUSH: 9.0000 mL | INTRAVENOUS | Status: DC | PRN

## 2017-11-19 MED ORDER — SENNOSIDES-DOCUSATE SODIUM 8.6-50 MG PO TABS
1.0000 | ORAL_TABLET | Freq: Two times a day (BID) | ORAL | Status: DC
Start: 2017-11-19 — End: 2017-11-23
  Administered 2017-11-19 – 2017-11-22 (×7): 1 via ORAL
  Filled 2017-11-19 (×7): qty 1

## 2017-11-19 MED ORDER — DIPHENHYDRAMINE HCL 50 MG/ML IJ SOLN
25.0000 mg | Freq: Once | INTRAMUSCULAR | Status: AC
  Administered 2017-11-19: 25 mg via INTRAVENOUS
  Filled 2017-11-19: qty 1

## 2017-11-19 MED ORDER — HYDROMORPHONE 1 MG/ML IV SOLN
INTRAVENOUS | Status: DC
  Administered 2017-11-19: 3.5 mg via INTRAVENOUS
  Administered 2017-11-19: 21:00:00 via INTRAVENOUS
  Administered 2017-11-20: 4 mg via INTRAVENOUS
  Administered 2017-11-20: 4.5 mg via INTRAVENOUS
  Administered 2017-11-20: 6 mg via INTRAVENOUS
  Administered 2017-11-20: 4.5 mg via INTRAVENOUS
  Administered 2017-11-20: 2.5 mg via INTRAVENOUS
  Administered 2017-11-20: 1 mg via INTRAVENOUS
  Administered 2017-11-21: 4 mg via INTRAVENOUS
  Administered 2017-11-21: 16:00:00 via INTRAVENOUS
  Administered 2017-11-21: 6.5 mg via INTRAVENOUS
  Administered 2017-11-21: 2 mg via INTRAVENOUS
  Administered 2017-11-21: 3 mg via INTRAVENOUS
  Administered 2017-11-21: 11 mg via INTRAVENOUS
  Administered 2017-11-21 – 2017-11-22 (×2): 4 mg via INTRAVENOUS
  Administered 2017-11-22: 10 mg via INTRAVENOUS
  Administered 2017-11-22: 7 mg via INTRAVENOUS
  Administered 2017-11-22: 1.5 mg via INTRAVENOUS
  Administered 2017-11-22: 4 mg via INTRAVENOUS
  Administered 2017-11-22: 3 mg via INTRAVENOUS
  Administered 2017-11-23: 30 mg via INTRAVENOUS
  Administered 2017-11-23: 9 mg via INTRAVENOUS
  Administered 2017-11-23: 7.5 mg via INTRAVENOUS
  Administered 2017-11-23: 10.5 mg via INTRAVENOUS
  Filled 2017-11-19 (×2): qty 30
  Filled 2017-11-19 (×3): qty 25

## 2017-11-19 MED ORDER — MORPHINE SULFATE ER 30 MG PO TBCR
30.0000 mg | EXTENDED_RELEASE_TABLET | Freq: Two times a day (BID) | ORAL | Status: DC
  Administered 2017-11-19 – 2017-11-23 (×8): 30 mg via ORAL
  Filled 2017-11-19 (×8): qty 1

## 2017-11-19 MED ORDER — SODIUM CHLORIDE 0.9 % IV SOLN
25.0000 mg | INTRAVENOUS | Status: DC | PRN
  Filled 2017-11-19: qty 0.5

## 2017-11-19 MED ORDER — SODIUM CHLORIDE 0.45 % IV SOLN
INTRAVENOUS | Status: DC
  Administered 2017-11-19: 13:00:00 via INTRAVENOUS

## 2017-11-19 MED ORDER — ONDANSETRON HCL 4 MG/2ML IJ SOLN
4.0000 mg | INTRAMUSCULAR | Status: DC | PRN
  Administered 2017-11-19 (×2): 4 mg via INTRAVENOUS
  Filled 2017-11-19 (×2): qty 2

## 2017-11-19 MED ORDER — HYDROMORPHONE HCL 2 MG/ML IJ SOLN
2.0000 mg | INTRAMUSCULAR | Status: AC
  Administered 2017-11-19: 2 mg via INTRAVENOUS
  Filled 2017-11-19: qty 1

## 2017-11-19 MED ORDER — NALOXONE HCL 0.4 MG/ML IJ SOLN: 0.4000 mg | INTRAMUSCULAR | Status: DC | PRN

## 2017-11-19 NOTE — ED Provider Notes (Signed)
[facility] DEPT Provider Note   CSN: [id number] Arrival date & time: 11/19/17  1218     History   Chief Complaint No chief complaint on file.   HPI [name] is a 27 y.o. female.  Pt presents to the ED today with sickle cell pain.  The pt has a hx of SCD and had her last visit on 6/5 to the [facility].  She was given a month's supply of her meds and is out of those now.  EMS gave pt 100 mc fentanyl IN en route.  I had to wake her up to talk to her.  She is a very poor historian and is moaning that she hurts.     Past Medical History:  Diagnosis Date  . Blood transfusion    "lots"  . Blood transfusion without reported diagnosis   . Chronic back pain    "very severe; have knot in my back; from tight muscle; take RX and exercise for it"  . Depression 01/06/2011  . Exertional dyspnea    "sometimes"  . Genital HSV   . GERD (gastroesophageal reflux disease) 02/17/2011  . Migraines 11/08/11   "@ least twice/month"  . 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  . Mood swings 11/08/11   "I go back and forth; real bad"  . Sickle cell anemia ([name])   . Sickle cell anemia with crisis ([place])   . Thrombocytosis ([place]) 11/09/2015  . Trichotillomania    h/o    Patient Active Problem List   Diagnosis Date Noted  . 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 Chest pain 09/29/2017  . Sickle cell crisis ([place]) 09/29/2017  . Hb-SS disease with crisis ([place]) 08/20/2016  . Chronic anticoagulation   . Sickle cell anemia ([place]) 05/19/2016  . History of DVT (deep vein thrombosis) 04/17/2016  . Thrombocytosis ([place]) 11/09/2015  . Hyperbilirubinemia 11/09/2015  . Chronic pain 08/04/2015  . Herpes simplex 07/14/2015  . Anemia of chronic disease   . Sickle cell pain crisis ([place]) 03/29/2012  . 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 Hb-SS disease without crisis ([place]) April [birth year] 1992    Past Surgical History:  Procedure Laterality Date  .  CHOLECYSTECTOMY  05/2010  . DILATION AND CURETTAGE OF UTERUS  02/20/11   S/P miscarriage  . IR GENERIC HISTORICAL  12/23/2015   IR FLUORO GUIDE CV LINE RIGHT 12/23/2015 Jacqulynn Cadet, MD [facility]  . IR GENERIC HISTORICAL  12/23/2015   IR US GUIDE VASC ACCESS RIGHT 12/23/2015 Jacqulynn Cadet, MD [facility]     OB History    Gravida  5   Para  2   Term  2   Preterm  0   AB  3   Living  2     SAB  3   TAB  0   Ectopic  0   Multiple  0   Live Births  2        Obstetric Comments  Miscarried in October 2012 at about 7 weeks         Home Medications    Prior to Admission medications   Medication Sig Start Date End Date Taking? Authorizing Provider  aspirin-acetaminophen-caffeine (EXCEDRIN MIGRAINE) [phone] MG tablet Take 2 tablets every 6 (six) hours as needed by mouth for headache.   Yes [provider]  folic acid (FOLVITE) 1 MG tablet Take 1 mg by mouth daily.   Yes [provider]  hydroxyurea (HYDREA) 500 MG  capsule Take 2 tabs (1000 mg ) PO QOD starting on 06/02/2017 alternating with 1 tab (500 mg) PO QOD starting on 06/03/2017. May take with food to minimize GI side effects. Patient taking differently: Take 1,500 mg by mouth daily. May take with food to minimize GI side effects. 07/24/17  Yes Scot Jun, FNP  morphine (MS CONTIN) 30 MG 12 hr tablet Take 1 tablet (30 mg total) by mouth every 12 (twelve) hours. 10/18/17  Yes Azzie Glatter, FNP  Oxycodone HCl 20 MG TABS Take 20 mg by mouth every 6 (six) hours as needed (pain).  11/09/17  Yes [provider]  cyclobenzaprine (FLEXERIL) 10 MG tablet Take 1 tablet (10 mg total) by mouth 3 (three) times daily as needed for muscle spasms. Patient not taking: Reported on 09/29/2017 07/24/17   Scot Jun, FNP  gabapentin (NEURONTIN) 300 MG capsule Take 1 capsule (300 mg total) by mouth 3 (three) times daily. Patient not taking: Reported on 10/18/2017 07/24/17   Scot Jun,  FNP    Family History Family History  Problem Relation Age of Onset  . Sickle cell trait Mother   . Sickle cell trait Father   . Diabetes Maternal Grandmother   . Diabetes Paternal Grandmother   . Hypertension Paternal Grandmother   . Diabetes Maternal Grandfather     Social History Social History   Tobacco Use  . Smoking status: Current Some Day Smoker    Packs/day: 0.25    Years: 1.00    Pack years: 0.25    Types: Cigarettes    Last attempt to quit: 03/25/2013    Years since quitting: 4.6  . Smokeless tobacco: Never Used  Substance Use Topics  . Alcohol use: No    Comment: denies  . Drug use: No    Types: Other-see comments    Comment: "Clean for 3 months"     Allergies   Carrot [daucus carota] and Latex   Review of Systems Review of Systems  Musculoskeletal: Positive for arthralgias.  All other systems reviewed and are negative.    Physical Exam Updated Vital Signs BP 118/60   Pulse 93   Resp 16   SpO2 91%   Physical Exam  Constitutional: She is oriented to person, place, and time. She appears well-developed. She appears distressed.  HENT:  Head: Normocephalic and atraumatic.  Right Ear: External ear normal.  Left Ear: External ear normal.  Nose: Nose normal.  Mouth/Throat: Oropharynx is clear and moist.  Eyes: Pupils are equal, round, and reactive to light. Conjunctivae and EOM are normal.  Neck: Normal range of motion. Neck supple.  Cardiovascular: Normal rate, regular rhythm, normal heart sounds and intact distal pulses.  Pulmonary/Chest: Effort normal and breath sounds normal.  Abdominal: Soft. Bowel sounds are normal.  Musculoskeletal: Normal range of motion.  Neurological: She is alert and oriented to person, place, and time.  Skin: Skin is warm. Capillary refill takes less than 2 seconds.  Psychiatric: She has a normal mood and affect. Her behavior is normal. Judgment and thought content normal.  Nursing note and vitals  reviewed.    ED Treatments / Results  Labs (all labs ordered are listed, but only abnormal results are displayed) Labs Reviewed  CBC WITH DIFFERENTIAL/PLATELET - Abnormal; Notable for the following components:      Result Value   WBC 26.3 (*)    RBC 2.53 (*)    Hemoglobin 7.8 (*)    HCT 22.6 (*)  RDW 19.9 (*)    Platelets 521 (*)    Neutro Abs 21.8 (*)    Monocytes Absolute 2.9 (*)    All other components within normal limits  RETICULOCYTES - Abnormal; Notable for the following components:   Retic Ct Pct 21.3 (*)    RBC. 2.53 (*)    Retic Count, Absolute 538.9 (*)    All other components within normal limits  COMPREHENSIVE METABOLIC PANEL - Abnormal; Notable for the following components:   Glucose, Bld 120 (*)    Total Bilirubin 3.7 (*)    All other components within normal limits  URINALYSIS, ROUTINE W REFLEX MICROSCOPIC  I-STAT BETA HCG BLOOD, ED ([facility], [facility], [facility] ONLY)  POC URINE PREG, ED    EKG None  Radiology No results found.  Procedures Procedures (including critical care time)  Medications Ordered in ED Medications  0.45 % sodium chloride infusion ( Intravenous New Bag/Given 11/19/17 1318)  ondansetron (ZOFRAN) injection 4 mg (4 mg Intravenous Given 11/19/17 1333)  HYDROmorphone (DILAUDID) injection 2 mg (2 mg Intravenous Not Given 11/19/17 1534)    Or  HYDROmorphone (DILAUDID) injection 2 mg ( Subcutaneous See Alternative 11/19/17 1534)  HYDROmorphone (DILAUDID) injection 2 mg (has no administration in time range)    Or  HYDROmorphone (DILAUDID) injection 2 mg (has no administration in time range)  HYDROmorphone (DILAUDID) injection 2 mg (has no administration in time range)    Or  HYDROmorphone (DILAUDID) injection 2 mg (has no administration in time range)  ketorolac (TORADOL) 30 MG/ML injection 30 mg (30 mg Intravenous Given 11/19/17 1333)  diphenhydrAMINE (BENADRYL) injection 25 mg (25 mg Intravenous Given 11/19/17 1333)  HYDROmorphone (DILAUDID) injection 2 mg  (2 mg Intravenous Given 11/19/17 1509)    Or  HYDROmorphone (DILAUDID) injection 2 mg ( Subcutaneous See Alternative 11/19/17 1509)     Initial Impression / Assessment and Plan / ED Course  I have reviewed the triage vital signs and the nursing notes.  Pertinent labs & imaging results that were available during my care of the patient were reviewed by me and considered in my medical decision making (see chart for details).  Pt still with pain.  She is signed out to Dr. Laverta Baltimore pending pain relief.  UA and CXR also pending at shift change.  Final Clinical Impressions(s) / ED Diagnoses   Final diagnoses:  Sickle cell pain crisis [facility])    ED Discharge Orders    None       Isla Pence, MD 11/19/17 1550

## 2017-11-19 NOTE — ED Notes (Signed)
ED TO INPATIENT HANDOFF REPORT  Name/Age/Gender [name] 27 y.o. female  Code Status Code Status History    Date Active Date Inactive Code Status Order ID Comments User Context   10/06/2017 0002 10/09/2017 1803 Full Code [id number]  Rise Patience, MD ED   09/30/2017 0122 10/03/2017 2319 Full Code [id number]  Toy Baker, MD Inpatient   07/10/2017 1947 07/13/2017 1916 Full Code [id number]  Elwyn Reach, MD ED   07/02/2017 0848 07/07/2017 2044 Full Code [id number]  Elwyn Reach, MD Inpatient   05/27/2017 2139 06/01/2017 1937 Full Code [id number]  Etta Quill, DO ED   05/04/2017 2105 05/07/2017 2127 Full Code [id number]  Rise Patience, MD ED   04/01/2017 1813 04/03/2017 1841 Full Code [id number]  Aline August, MD ED   02/28/2017 0045 03/05/2017 2133 Full Code [id number]  Etta Quill, DO ED   02/02/2017 0309 02/04/2017 2250 Full Code [id number]  Bethena Roys, MD Inpatient   12/10/2016 0403 12/14/2016 1705 Full Code [id number]  Etta Quill, DO ED   11/06/2016 2039 11/11/2016 1659 Full Code [id number]  Patrecia Pour, MD Inpatient   10/06/2016 1647 10/09/2016 1809 Full Code [id number]  [name], MD ED   10/02/2016 1512 10/05/2016 1042 Full Code [id number]  Tresa Garter, MD Inpatient   09/15/2016 1723 09/19/2016 2025 Full Code [id number]  Dorena Dew, [place] Inpatient   08/20/2016 1812 08/30/2016 1613 Full Code [id number]  [name], MD Inpatient   07/25/2016 2320 07/29/2016 2126 Full Code [id number]  Norval Morton, MD ED   06/26/2016 2154 07/02/2016 1954 Full Code [id number]  Etta Quill, DO ED   06/10/2016 0511 06/15/2016 1511 Full Code [id number]  Etta Quill, DO ED   05/29/2016 2007 05/31/2016 1803 Full Code [id number]  Cristal Ford, DO Inpatient   05/19/2016 1914 05/25/2016 2005 Full Code [id number]  Charlynne Cousins, MD Inpatient   04/24/2016 1059 05/02/2016 1750 Full Code [id number]  Elwyn Reach, MD Inpatient    04/17/2016 2105 04/20/2016 1913 Full Code [id number]  Reubin Milan, MD Inpatient   04/17/2016 2105 04/17/2016 2105 Full Code [id number]  Reubin Milan, MD Inpatient   03/31/2016 1638 04/07/2016 1808 Full Code [id number]  [name], MD Inpatient   02/26/2016 1859 03/03/2016 2104 Full Code [id number]  [name], MD Inpatient   02/16/2016 1033 02/17/2016 1101 Full Code [id number]  Tresa Garter, MD Inpatient   01/30/2016 0751 02/05/2016 1904 Full Code [id number]  Ivor Costa, MD ED   01/05/2016 0837 01/09/2016 1727 Full Code [id number]  Tresa Garter, MD Inpatient   12/27/2015 0358 12/29/2015 2009 Full Code [id number]  Ivor Costa, MD ED   11/09/2015 0118 11/12/2015 1837 Full Code [id number]  Norval Morton, MD ED   10/31/2015 1854 11/02/2015 1456 Full Code [id number]  Manya Silvas, [place] Inpatient   10/30/2015 1446 10/31/2015 1854 Full Code [id number]  Lavonia Drafts, MD Inpatient   10/26/2015 2125 10/30/2015 1446 Full Code [id number]  Gwynne Edinger, MD Inpatient   10/15/2015 1857 10/18/2015 2027 Full Code [id number]  [name], MD Inpatient   10/06/2015 2258 10/10/2015 1623 Full Code [id number]  Lily Kocher, MD ED   09/22/2015 2026 09/27/2015 1735 Full Code [id number]  Osborne Oman, MD Inpatient   09/07/2015 1431 09/14/2015 1454 Full Code [id number]  [name], MD Inpatient   08/10/2015 1405 08/17/2015 1638 Full Code [id number]  [name], MD Inpatient   07/24/2015 [phone]  08/02/2015 1817 Full Code [id number]  Carlyle Dolly, MD Inpatient   07/24/2015 0447 07/24/2015 0531 Full Code [id number]  Carlyle Dolly, MD Inpatient   07/24/2015 0353 07/24/2015 0447 Full Code [id number]  Carlyle Dolly, MD Inpatient   07/12/2015 0214 07/14/2015 2028 Full Code [id number]  Ivor Costa, MD ED   06/28/2015 1037 07/03/2015 1441 Full Code [id number]  [name], MD Inpatient   05/31/2015 1459 06/05/2015 2049 Full Code [id number]  [name], MD  ED   05/02/2015 1639 05/08/2015 1644 Full Code [id number]  [name], MD Inpatient   04/14/2015 1741 04/22/2015 2006 Full Code [id number]  Tresa Garter, MD Inpatient   04/14/2015 1741 04/14/2015 1741 Full Code [id number]  Tresa Garter, MD Inpatient   04/14/2015 1153 04/14/2015 1741 Full Code [id number]  Tresa Garter, MD Inpatient   03/30/2015 1104 04/03/2015 1742 Full Code [id number]  Tresa Garter, MD Inpatient   03/22/2015 2042 03/26/2015 1747 Full Code [id number]  Reubin Milan, MD ED   01/25/2015 1323 01/27/2015 1554 Full Code [id number]  Elwyn Reach, MD Inpatient   12/07/2014 1557 12/10/2014 1720 Full Code [id number]  Keitha Butte, CNM Inpatient   12/05/2014 0920 12/07/2014 1557 Full Code [id number]  Tami Ribas, RN Inpatient   11/26/2014 2216 12/01/2014 2033 Full Code [id number]  Virginia Crews, MD Inpatient   11/19/2014 0129 11/21/2014 1812 Full Code [id number]  Seabron Spates, [place] Inpatient   10/23/2014 2118 10/30/2014 1932 Full Code [id number]  Truett Mainland, DO Inpatient   10/11/2014 0010 10/14/2014 1707 Full Code [id number]  Larey Days, [place] Inpatient   09/18/2014 1545 09/26/2014 1656 Full Code [id number]  Lavonia Drafts, MD Inpatient   09/08/2014 0117 09/10/2014 1821 Full Code [id number]  Lavonia Drafts, MD Inpatient   09/08/2014 0117 09/08/2014 0117 Full Code [id number]  Lavonia Drafts, MD Inpatient   08/09/2014 2137 08/15/2014 2048 Full Code [id number]  Tresea Mall, CNM Inpatient   07/14/2014 0301 07/16/2014 1653 Full Code [id number]  Brantley Persons, RN Inpatient   06/25/2014 1651 06/28/2014 1705 Full Code [id number]  [name], MD Inpatient   06/02/2014 0106 06/05/2014 1936 Full Code [id number]  Elberta Leatherwood, MD Inpatient   05/03/2014 1818 05/06/2014 2158 Full Code [id number]  Markus Daft, MD Inpatient   05/02/2014 1207 05/03/2014 1818 Full Code [id number]  [name], MD ED   04/10/2014 1641  04/18/2014 2117 Full Code [id number]  Greggory Keen, MD Inpatient   04/09/2014 1028 04/10/2014 1641 Full Code [id number]  Theodis Blaze, MD Inpatient   02/16/2014 2020 02/25/2014 1411 Full Code [id number]  Caren Griffins, MD ED   01/29/2014 0343 02/01/2014 1442 Full Code [id number]  Rise Patience, MD Inpatient   01/13/2014 1815 01/16/2014 1304 Full Code [id number]  Robbie Lis, MD Inpatient   11/25/2013 1437 11/30/2013 2009 Full Code [id number]  Verlee Monte, MD Inpatient   09/24/2013 0615 09/27/2013 1532 Full Code [id number]  Rise Patience, MD Inpatient   07/31/2013 0312 08/04/2013 2047 Full Code [id number]  Etta Quill, DO ED   07/16/2013 0907 07/16/2013 2310 Full Code [id number]  [name], MD Inpatient   05/28/2013 0515 06/01/2013 2117 Full Code [id number]  Berle Mull, MD ED   03/23/2013 0430 03/29/2013 1937 Full Code [id number]  Theressa Millard, MD ED   02/11/2013 0803 02/14/2013 1639 Full Code [id number]  Rise Patience, MD Inpatient   01/27/2013 1257 01/30/2013 1853 Full Code [id number]  Domenic Polite, MD Inpatient   10/05/2012 2154 10/12/2012 2107 Full Code [id number]  Theressa Millard, MD ED   08/27/2012 1955 08/31/2012 1833 Full Code [id number]  Thurnell Lose, MD Inpatient   06/22/2012 1436 06/27/2012 1457 Full Code [id number]  Jorene Guest, RN Inpatient   03/28/2012 2122 04/04/2012 1929 Full Code [id number]  Verl Dicker, PA-C ED   03/24/2012 0432 03/25/2012 1144 Full Code [id number]  Martie Lee, [place] ED   03/04/2012 0448 03/15/2012 2258 Full Code [id number]  Kennith Center, RN Inpatient   02/13/2012 0647 02/17/2012 0124 Full Code [id number]  Jacqualyn Posey, RN Inpatient   12/17/2011 1614 12/22/2011 1716 Full Code [id number]  Theodis Blaze, MD Inpatient   12/16/2011 2205 12/17/2011 1614 Full Code [id number]  Threasa Beards, MD ED   11/30/2011 0309 12/03/2011 1745 Full Code [id number]  Evelina Dun, RN ED   11/08/2011 0723 11/14/2011 1823 Full Code [id number]  Ron Parker, RN  ED   09/05/2011 0204 09/05/2011 0732 Full Code [id number]  Garald Balding, NP ED   08/05/2011 0755 08/05/2011 1852 Full Code [id number]  Rainey Pines, RN ED   08/05/2011 0028 08/05/2011 0755 Full Code [id number]  Charlena Cross, MD ED   07/13/2011 1149 07/15/2011 1847 Full Code [id number]  Julieta Bellini, NP ED   06/09/2011 0048 06/09/2011 0959 Full Code [id number]  Charlena Cross, MD ED   05/31/2011 1828 06/01/2011 1837 Full Code [id number]  Jenel Lucks, RN Inpatient   05/26/2011 0218 05/26/2011 1419 Full Code [id number]  Molpus, Karen Chafe, MD ED   05/06/2011 0700 05/08/2011 1609 Full Code [id number]  Bluford, [place], MD Inpatient   04/17/2011 1329 04/19/2011 1650 Full Code [id number]  Judithann Sheen, MD ED   03/30/2011 0039 04/03/2011 0025 Full Code [id number]  Hoy Morn, MD ED      Home/SNF/Other Home  Chief Complaint Sickle Cell  Level of Care/Admitting Diagnosis ED Disposition    ED Disposition Condition [place]: [facility] [100102]  Level of Care: Med-Surg [16]  Diagnosis: Sickle cell anemia ([place]) [408144]  Admitting Physician: Elwyn Reach [[id number]]  Attending Physician: Elwyn Reach [[id number]]  Estimated length of stay: past midnight tomorrow  Certification:: I certify this patient will need inpatient services for at least 2 midnights  PT Class (Do Not Modify): Inpatient [101]  PT Acc Code (Do Not Modify): Private [1]       Medical History Past Medical History:  Diagnosis Date  . Blood transfusion    "lots"  . Blood transfusion without reported diagnosis   . Chronic back pain    "very severe; have knot in my back; from tight muscle; take RX and exercise for it"  . Depression 01/06/2011  . Exertional dyspnea    "sometimes"  . Genital HSV   . GERD (gastroesophageal reflux disease) 02/17/2011  . Migraines 11/08/11   "@ least twice/month"  . 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  . Mood swings  11/08/11   "I go back and forth; real bad"  . Sickle cell anemia ([name])   . Sickle cell anemia with crisis ([place])   . Thrombocytosis ([place]) 11/09/2015  . Trichotillomania    h/o    Allergies Allergies  Allergen Reactions  . Carrot [Daucus [place]ota] Hives and Swelling    Allergic to carrots  . Latex Rash    IV Location/Drains/Wounds Patient Lines/Drains/Airways Status  Active Line/Drains/Airways    Name:   Placement date:   Placement time:   Site:   Days:   Implanted Port Right Chest   -    -    Chest             Labs/Imaging Results for orders placed or performed during the hospital encounter of 11/19/17 (from the past 48 hour(s))  CBC WITH DIFFERENTIAL     Status: Abnormal   Collection Time: 11/19/17  1:20 PM  Result Value Ref Range   WBC 26.3 (H) 4.0 - 10.5 K/uL   RBC 2.53 (L) 3.87 - 5.11 MIL/uL   Hemoglobin 7.8 (L) 12.0 - 15.0 g/dL   HCT 22.6 (L) 36.0 - 46.0 %   MCV 89.3 78.0 - 100.0 fL   MCH 30.8 26.0 - 34.0 pg   MCHC 34.5 30.0 - 36.0 g/dL   RDW 19.9 (H) 11.5 - 15.5 %   Platelets 521 (H) 150 - 400 K/uL   Neutrophils Relative % 74 %   Lymphocytes Relative 5 %   Monocytes Relative 11 %   Eosinophils Relative 1 %   Basophils Relative 0 %   Band Neutrophils 3 %   Metamyelocytes Relative 2 %   Myelocytes 3 %   Promyelocytes Relative 1 %   Blasts 0 %   nRBC 0 0 /100 WBC   Other 0 %   Neutro Abs 21.8 (H) 1.7 - 7.7 K/uL   Lymphs Abs 1.3 0.7 - 4.0 K/uL   Monocytes Absolute 2.9 (H) 0.1 - 1.0 K/uL   Eosinophils Absolute 0.3 0.0 - 0.7 K/uL   Basophils Absolute 0.0 0.0 - 0.1 K/uL   RBC Morphology POLYCHROMASIA PRESENT     Comment: TARGET CELLS HOWELL/JOLLY BODIES Sickle cells present RARE NRBCs    WBC Morphology MILD LEFT SHIFT (1-5% METAS, OCC MYELO, OCC BANDS)     Comment: Performed at [facility], [place] [address], [place], [place] [address]  Reticulocytes     Status: Abnormal   Collection Time: 11/19/17  1:20 PM  Result Value Ref Range    Retic Ct Pct 21.3 (H) 0.4 - 3.1 %   RBC. 2.53 (L) 3.87 - 5.11 MIL/uL   Retic Count, Absolute 538.9 (H) 19.0 - 186.0 K/uL    Comment: Performed at [facility], [place] [address], [place], [place] [address]  Comprehensive metabolic panel     Status: Abnormal   Collection Time: 11/19/17  1:20 PM  Result Value Ref Range   Sodium 139 135 - 145 mmol/L   Potassium 3.6 3.5 - 5.1 mmol/L   Chloride 103 98 - 111 mmol/L    Comment: Please note change in reference range.   CO2 27 22 - 32 mmol/L   Glucose, Bld 120 (H) 70 - 99 mg/dL    Comment: Please note change in reference range.   BUN 9 6 - 20 mg/dL    Comment: Please note change in reference range.   Creatinine, Ser 0.75 0.44 - 1.00 mg/dL   Calcium 9.0 8.9 - 10.3 mg/dL   Total Protein 7.9 6.5 - 8.1 g/dL   Albumin 4.6 3.5 - 5.0 g/dL   AST 25 15 - 41 U/L   ALT 15 0 - 44 U/L    Comment: Please note change in reference range.   Alkaline Phosphatase 83 38 - 126 U/L   Total Bilirubin 3.7 (H) 0.3 - 1.2 mg/dL   GFR calc non Af Amer >60 >60 mL/min  GFR calc Af Amer >60 >60 mL/min    Comment: (NOTE) The eGFR has been calculated using the CKD EPI equation. This calculation has not been validated in all clinical situations. eGFR's persistently <60 mL/min signify possible Chronic Kidney Disease.    Anion gap 9 5 - 15    Comment: Performed at [facility], [place] [address], [place], [place] [address]  I-Stat Beta hCG blood, ED ([facility], [facility], [facility] only)     Status: None   Collection Time: 11/19/17  1:27 PM  Result Value Ref Range   I-stat hCG, quantitative <5.0 <5 mIU/mL   Comment 3            Comment:   GEST. AGE      CONC.  (mIU/mL)   <=1 WEEK        5 - 50     2 WEEKS       50 - 500     3 WEEKS       100 - 10,000     4 WEEKS     1,000 - 30,000        FEMALE AND NON-PREGNANT FEMALE:     LESS THAN 5 mIU/mL    *Note: Due to a large number of results and/or encounters for the requested time period, some results have  not been displayed. A complete set of results can be found in Results Review.   Dg Chest 2 View  Result Date: 11/19/2017 CLINICAL DATA:  Elevated WBC - sickle cell - current smoker per chart EXAM: CHEST - 2 VIEW COMPARISON:  Chest x-rays dated 10/05/2017 and 05/27/2017. FINDINGS: Stable cardiomegaly. Linear opacity at the RIGHT lung base, likely atelectasis. No pleural effusion or pneumothorax seen. RIGHT chest wall Port-A-Cath appears adequately positioned with tip at the level of the mid SVC. No acute or suspicious osseous finding. Sclerotic changes again noted at the bilateral heads bilaterally, compatible with chronic AVN. Additional chronic sequela of sickle cell within the thoracolumbar spine. IMPRESSION: 1. Probable atelectasis at the RIGHT lung base. Lungs otherwise clear. 2. Stable cardiomegaly. 3. Bilateral AVN of the humeral heads. Electronically Signed   By: Franki Cabot M.D.   On: 11/19/2017 16:30    Pending Labs Unresulted Labs (From admission, onward)   Start     Ordered   11/19/17 1811  Urine rapid drug screen (hosp performed)  STAT,   STAT     11/19/17 1810   11/19/17 1253  Urinalysis, Routine w reflex microscopic  STAT,   STAT     11/19/17 1252   Signed and Held  CBC  (enoxaparin (LOVENOX)    CrCl >/= 30 ml/min)  Once,   R    Comments:  Baseline for enoxaparin therapy IF NOT ALREADY DRAWN.  Notify MD if PLT < 100 K.    Signed and Held   Signed and Held  Creatinine, serum  (enoxaparin (LOVENOX)    CrCl >/= 30 ml/min)  Once,   R    Comments:  Baseline for enoxaparin therapy IF NOT ALREADY DRAWN.    Signed and Held   Signed and Held  Creatinine, serum  (enoxaparin (LOVENOX)    CrCl >/= 30 ml/min)  Weekly,   R    Comments:  while on enoxaparin therapy    Signed and Held   Signed and Held  Comprehensive metabolic panel  Tomorrow morning,   R     Signed and Held   Signed and Held  CBC with Differential/Platelet  Tomorrow morning,   R     Signed and Held       Vitals/Pain Today's Vitals   11/19/17 1700 11/19/17 1730 11/19/17 1800 11/19/17 1900  BP: 125/83 (!) 123/56 (!) 128/96 122/83  Pulse: 98 93 94 94  Resp: _0 SpO2: 95% 94% (!) 87% 93%  PainSc:        Isolation Precautions No active isolations  Medications Medications  0.45 % sodium chloride infusion ( Intravenous New Bag/Given 11/19/17 1318)  ondansetron (ZOFRAN) injection 4 mg (4 mg Intravenous Given 11/19/17 1333)  HYDROmorphone (DILAUDID) injection 2 mg (2 mg Intravenous Not Given 11/19/17 1534)    Or  HYDROmorphone (DILAUDID) injection 2 mg ( Subcutaneous See Alternative 11/19/17 1534)  HYDROmorphone (DILAUDID) injection 2 mg (has no administration in time range)    Or  HYDROmorphone (DILAUDID) injection 2 mg (has no administration in time range)  HYDROmorphone (DILAUDID) injection 2 mg (2 mg Intravenous Given 11/19/17 1737)    Or  HYDROmorphone (DILAUDID) injection 2 mg ( Subcutaneous See Alternative 11/19/17 1737)  ketorolac (TORADOL) 30 MG/ML injection 30 mg (30 mg Intravenous Given 11/19/17 1333)  diphenhydrAMINE (BENADRYL) injection 25 mg (25 mg Intravenous Given 11/19/17 1333)  HYDROmorphone (DILAUDID) injection 2 mg (2 mg Intravenous Given 11/19/17 1509)    Or  HYDROmorphone (DILAUDID) injection 2 mg ( Subcutaneous See Alternative 11/19/17 1509)    Mobility walks

## 2017-11-19 NOTE — ED Notes (Signed)
Bed: WA06 Expected date:  Expected time:  Means of arrival:  Comments: [facility]

## 2017-11-19 NOTE — ED Triage Notes (Signed)
Patient presented to ed with c/o sickle cell pain. Patient was given fentanyl 100 mcg nasal and po benadryl.

## 2017-11-19 NOTE — ED Notes (Signed)
Pt unable to urinate at this time.  Pt very sleepy at the moment.

## 2017-11-19 NOTE — ED Provider Notes (Signed)
Blood pressure 118/60, pulse 93, resp. rate 16, SpO2 91 %.  Assuming care from Dr. Gilford Raid.  In short, [name] is a 27 y.o. female with a chief complaint of No chief complaint on file. Marland Kitchen  Refer to the original H&P for additional details.  The current plan of care is to f/u after pain mgmt in the ED and CXR.  05:40 PM CXR reviewed with no acute findings. Patient with vomiting here in the ED. No significant improvement in pain at this time. Will give additional dose but will likely need admission for nausea/vomiting and pain mgmt.   Discussed patient's case with Hospitalist, Dr. Jonelle Sidle to request admission. Patient and family (if present) updated with plan. Care transferred to Hospitalist service.  I reviewed all nursing notes, vitals, pertinent old records, EKGs, labs, imaging (as available).  Nanda Quinton, MD     Margette Fast, MD 11/19/17 [phone]

## 2017-11-19 NOTE — H&P (Signed)
[name] is an 27 y.o. female.    Chief Complaint: Pain in the back and leg pain  HPI: Patient is a 27 year old female with history of sickle cell disease and frequent hospitalizations.  She also has frequent history of cocaine abuse.  She came in with 3 days history of lower back pain leg pains and generalized malaise.  Symptoms are consistent with her typical sickle cell crisis.  She also complained of 10 out of 10 pain mainly in the lower back.  No fever or chills no nausea vomiting or diarrhea.  Patient has taken her home medications with no relief.  She has some mild shortness of breath and chest pain.  Patient was given up to 6 mg of Dilaudid IV in the ER with no relief.  She is therefore being admitted for inpatient care  Past Medical History:  Diagnosis Date  . Blood transfusion    "lots"  . Blood transfusion without reported diagnosis   . Chronic back pain    "very severe; have knot in my back; from tight muscle; take RX and exercise for it"  . Depression 01/06/2011  . Exertional dyspnea    "sometimes"  . Genital HSV   . GERD (gastroesophageal reflux disease) 02/17/2011  . Migraines 11/08/11   "@ least twice/month"  . 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  . Mood swings 11/08/11   "I go back and forth; real bad"  . Sickle cell anemia ([name])   . Sickle cell anemia with crisis ([place])   . Thrombocytosis ([place]) 11/09/2015  . Trichotillomania    h/o    Past Surgical History:  Procedure Laterality Date  . CHOLECYSTECTOMY  05/2010  . DILATION AND CURETTAGE OF UTERUS  02/20/11   S/P miscarriage  . IR GENERIC HISTORICAL  12/23/2015   IR FLUORO GUIDE CV LINE RIGHT 12/23/2015 Jacqulynn Cadet, MD [facility]  . IR GENERIC HISTORICAL  12/23/2015   IR US GUIDE VASC ACCESS RIGHT 12/23/2015 Jacqulynn Cadet, MD [facility]    Family History  Problem Relation Age of Onset  . Sickle cell trait Mother   . Sickle cell trait Father   . Diabetes Maternal Grandmother   .  Diabetes Paternal Grandmother   . Hypertension Paternal Grandmother   . Diabetes Maternal Grandfather    Social History:  reports that she has been smoking cigarettes.  She has a 0.25 pack-year smoking history. She has never used smokeless tobacco. She reports that she does not drink alcohol or use drugs.  Allergies:  Allergies  Allergen Reactions  . Carrot [Daucus Carota] Hives and Swelling    Allergic to carrots  . Latex Rash     (Not in a hospital admission)  Results for orders placed or performed during the hospital encounter of 11/19/17 (from the past 48 hour(s))  CBC WITH DIFFERENTIAL     Status: Abnormal   Collection Time: 11/19/17  1:20 PM  Result Value Ref Range   WBC 26.3 (H) 4.0 - 10.5 K/uL   RBC 2.53 (L) 3.87 - 5.11 MIL/uL   Hemoglobin 7.8 (L) 12.0 - 15.0 g/dL   HCT 22.6 (L) 36.0 - 46.0 %   MCV 89.3 78.0 - 100.0 fL   MCH 30.8 26.0 - 34.0 pg   MCHC 34.5 30.0 - 36.0 g/dL   RDW 19.9 (H) 11.5 - 15.5 %   Platelets 521 (H) 150 - 400 K/uL   Neutrophils Relative % 74 %   Lymphocytes Relative 5 %  Monocytes Relative 11 %   Eosinophils Relative 1 %   Basophils Relative 0 %   Band Neutrophils 3 %   Metamyelocytes Relative 2 %   Myelocytes 3 %   Promyelocytes Relative 1 %   Blasts 0 %   nRBC 0 0 /100 WBC   Other 0 %   Neutro Abs 21.8 (H) 1.7 - 7.7 K/uL   Lymphs Abs 1.3 0.7 - 4.0 K/uL   Monocytes Absolute 2.9 (H) 0.1 - 1.0 K/uL   Eosinophils Absolute 0.3 0.0 - 0.7 K/uL   Basophils Absolute 0.0 0.0 - 0.1 K/uL   RBC Morphology POLYCHROMASIA PRESENT     Comment: TARGET CELLS HOWELL/JOLLY BODIES Sickle cells present RARE NRBCs    WBC Morphology MILD LEFT SHIFT (1-5% METAS, OCC MYELO, OCC BANDS)     Comment: Performed at [facility], [place] [address] [place], [place] [address]  Reticulocytes     Status: Abnormal   Collection Time: 11/19/17  1:20 PM  Result Value Ref Range   Retic Ct Pct 21.3 (H) 0.4 - 3.1 %   RBC. 2.53 (L) 3.87 - 5.11 MIL/uL    Retic Count, Absolute 538.9 (H) 19.0 - 186.0 K/uL    Comment: Performed at [facility], [place] [address], [place], [place] [address]  Comprehensive metabolic panel     Status: Abnormal   Collection Time: 11/19/17  1:20 PM  Result Value Ref Range   Sodium 139 135 - 145 mmol/L   Potassium 3.6 3.5 - 5.1 mmol/L   Chloride 103 98 - 111 mmol/L    Comment: Please note change in reference range.   CO2 27 22 - 32 mmol/L   Glucose, Bld 120 (H) 70 - 99 mg/dL    Comment: Please note change in reference range.   BUN 9 6 - 20 mg/dL    Comment: Please note change in reference range.   Creatinine, Ser 0.75 0.44 - 1.00 mg/dL   Calcium 9.0 8.9 - 10.3 mg/dL   Total Protein 7.9 6.5 - 8.1 g/dL   Albumin 4.6 3.5 - 5.0 g/dL   AST 25 15 - 41 U/L   ALT 15 0 - 44 U/L    Comment: Please note change in reference range.   Alkaline Phosphatase 83 38 - 126 U/L   Total Bilirubin 3.7 (H) 0.3 - 1.2 mg/dL   GFR calc non Af Amer >60 >60 mL/min   GFR calc Af Amer >60 >60 mL/min    Comment: (NOTE) The eGFR has been calculated using the CKD EPI equation. This calculation has not been validated in all clinical situations. eGFR's persistently <60 mL/min signify possible Chronic Kidney Disease.    Anion gap 9 5 - 15    Comment: Performed at [facility], [place] [address] [place], [place] [address]  I-Stat Beta hCG blood, ED ([facility], [facility], [facility] only)     Status: None   Collection Time: 11/19/17  1:27 PM  Result Value Ref Range   I-stat hCG, quantitative <5.0 <5 mIU/mL   Comment 3            Comment:   GEST. AGE      CONC.  (mIU/mL)   <=1 WEEK        5 - 50     2 WEEKS       50 - 500     3 WEEKS       100 - 10,000     4  WEEKS     1,000 - 30,000        FEMALE AND NON-PREGNANT FEMALE:     LESS THAN 5 mIU/mL    *Note: Due to a large number of results and/or encounters for the requested time period, some results have not been displayed. A complete set of results can be found in Results  Review.   Dg Chest 2 View  Result Date: 11/19/2017 CLINICAL DATA:  Elevated WBC - sickle cell - current smoker per chart EXAM: CHEST - 2 VIEW COMPARISON:  Chest x-rays dated 10/05/2017 and 05/27/2017. FINDINGS: Stable cardiomegaly. Linear opacity at the RIGHT lung base, likely atelectasis. No pleural effusion or pneumothorax seen. RIGHT chest wall Port-A-Cath appears adequately positioned with tip at the level of the mid SVC. No acute or suspicious osseous finding. Sclerotic changes again noted at the bilateral heads bilaterally, compatible with chronic AVN. Additional chronic sequela of sickle cell within the thoracolumbar spine. IMPRESSION: 1. Probable atelectasis at the RIGHT lung base. Lungs otherwise clear. 2. Stable cardiomegaly. 3. Bilateral AVN of the humeral heads. Electronically Signed   By: Franki Cabot M.D.   On: 11/19/2017 16:30    Review of Systems  Constitutional: Positive for fever. Negative for chills.  HENT: Negative.   Eyes: Negative.   Respiratory: Negative.   Cardiovascular: Negative.   Gastrointestinal: Negative.   Genitourinary: Negative.   Musculoskeletal: Positive for back pain and myalgias.  Skin: Negative.   Neurological: Negative.   Endo/Heme/Allergies: Negative.   Psychiatric/Behavioral: Negative.     Blood pressure 118/60, pulse 93, resp. rate 16, SpO2 91 %. Physical Exam  Constitutional: She is oriented to person, place, and time. She appears well-developed and well-nourished.  HENT:  Head: Normocephalic and atraumatic.  Eyes: Pupils are equal, round, and reactive to light. Conjunctivae are normal.  Neck: Normal range of motion. Neck supple.  Cardiovascular: Normal rate, regular rhythm and normal heart sounds.  Respiratory: Effort normal and breath sounds normal.  GI: Soft. Bowel sounds are normal.  Musculoskeletal: Normal range of motion.  Neurological: She is alert and oriented to person, place, and time.  Skin: Skin is warm and dry.  Psychiatric:  She has a normal mood and affect.     Assessment/Plan 27 year old female admitted with sickle cell painful crisis.  #1 sickle cell painful crisis: Patient will be admitted to [place] bed.  We will start IV Dilaudid PCA with Toradol and IV fluids.  She has chronic pain and will continue with her long-acting MS Contin.  #2 chronic anemia: Secondary to sickle cell disease.  H&H appears to be at baseline.  Continue current treatment.  #3 chronic pain syndrome: We will continue with MS Contin.  #4 thrombocytosis: Secondary to sickle cell disease.  Continue monitoring.  #5 leukocytosis: Secondary to sickle cell crisis.  Continue treatment and monitor of white count.  Barbette Merino, MD 11/19/2017, 6:18 PM

## 2017-11-20 DIAGNOSIS — D473 Essential (hemorrhagic) thrombocythemia: Secondary | ICD-10-CM

## 2017-11-20 DIAGNOSIS — D72829 Elevated white blood cell count, unspecified: Secondary | ICD-10-CM

## 2017-11-20 DIAGNOSIS — D5700 Hb-SS disease with crisis, unspecified: Principal | ICD-10-CM

## 2017-11-20 DIAGNOSIS — F1910 Other psychoactive substance abuse, uncomplicated: Secondary | ICD-10-CM

## 2017-11-20 DIAGNOSIS — G894 Chronic pain syndrome: Secondary | ICD-10-CM

## 2017-11-20 LAB — COMPREHENSIVE METABOLIC PANEL
ALBUMIN: 4.2 g/dL (ref 3.5–5.0)
ALT: 16 U/L (ref 0–44)
AST: 24 U/L (ref 15–41)
Alkaline Phosphatase: 64 U/L (ref 38–126)
Anion gap: 8 (ref 5–15)
BUN: 8 mg/dL (ref 6–20)
CHLORIDE: 102 mmol/L (ref 98–111)
CO2: 28 mmol/L (ref 22–32)
CREATININE: 0.71 mg/dL (ref 0.44–1.00)
Calcium: 8.9 mg/dL (ref 8.9–10.3)
GFR calc Af Amer: >60 mL/min (ref >60)
GLUCOSE: 124 mg/dL — AB (ref 70–99)
Potassium: 3.5 mmol/L (ref 3.5–5.1)
SODIUM: 138 mmol/L (ref 135–145)
Total Bilirubin: 4 mg/dL — ABNORMAL HIGH (ref 0.3–1.2)
Total Protein: 7.4 g/dL (ref 6.5–8.1)

## 2017-11-20 LAB — CBC WITH DIFFERENTIAL/PLATELET
BASOS PCT: 0 %
Basophils Absolute: 0 10*3/uL (ref 0.0–0.1)
EOS PCT: 0 %
Eosinophils Absolute: 0 10*3/uL (ref 0.0–0.7)
HCT: 22 % — ABNORMAL LOW (ref 36.0–46.0)
Hemoglobin: 7.7 g/dL — ABNORMAL LOW (ref 12.0–15.0)
LYMPHS ABS: 1.5 10*3/uL (ref 0.7–4.0)
Lymphocytes Relative: 7 %
MCH: 31.2 pg (ref 26.0–34.0)
MCHC: 35 g/dL (ref 30.0–36.0)
MCV: 89.1 fL (ref 78.0–100.0)
Monocytes Absolute: 2.4 10*3/uL — ABNORMAL HIGH (ref 0.1–1.0)
Monocytes Relative: 11 %
Neutro Abs: 17.5 10*3/uL — ABNORMAL HIGH (ref 1.7–7.7)
Neutrophils Relative %: 82 %
Platelets: 527 10*3/uL — ABNORMAL HIGH (ref 150–400)
RBC: 2.47 MIL/uL — ABNORMAL LOW (ref 3.87–5.11)
RDW: 20.5 % — AB (ref 11.5–15.5)
WBC: 21.4 10*3/uL — ABNORMAL HIGH (ref 4.0–10.5)

## 2017-11-20 NOTE — Progress Notes (Addendum)
Subjective: [name], a 27 year old female with a history of sickle cell anemia, HbSS was admitted to inpatient services on 11/19/2017 in sickle cell crisis. Patient says that she continues to have back pain that is interfering with ambulation. She says that she was unable to walk without assistance prior to admission. Current pain intensity is 9/10 characterized as constant and throbbing.   Objective:  Vital signs in last 24 hours:  Vitals:   11/20/17 0212 11/20/17 0416 11/20/17 0454 11/20/17 0735  BP: 114/64  (!) 109/57   Pulse: 87  94   Resp: 16 13 15 16   Temp: (!) 100.7 F (38.2 C)  99.3 F (37.4 C)   TempSrc: Oral  Oral   SpO2: 94% 93% 94% 94%  Weight:      Height:        Intake/Output from previous day:   Intake/Output Summary (Last 24 hours) at 11/20/2017 0933 Last data filed at 11/20/2017 0436 Gross per 24 hour  Intake 911.42 ml  Output -  Net 911.42 ml    Physical Exam: General: Alert, awake, oriented x3, in no acute distress.  HEENT: [place]/AT PEERL, EOMI Neck: Trachea midline,  no masses, no thyromegal,y no JVD, no carotid bruit OROPHARYNX:  Moist, No exudate/ erythema/lesions.  Heart: Regular rate and rhythm, without murmurs, rubs, gallops, PMI non-displaced, no heaves or thrills on palpation.  Lungs: Clear to auscultation, no wheezing or rhonchi noted. No increased vocal fremitus resonant to percussion  Abdomen: Soft, nontender, nondistended, positive bowel sounds, no masses no hepatosplenomegaly noted..  Neuro: No focal neurological deficits noted cranial nerves II through XII grossly intact. DTRs 2+ bilaterally upper and lower extremities. Strength 5 out of 5 in bilateral upper and lower extremities. Musculoskeletal: No warm swelling or erythema around joints, no spinal tenderness noted. Psychiatric: Patient alert and oriented x3, good insight and cognition, good recent to remote recall. Lymph node survey: No cervical axillary or inguinal lymphadenopathy  noted.  Lab Results:  Basic Metabolic Panel:    Component Value Date/Time   NA 138 11/20/2017 0520   NA 143 07/24/2017 1416   NA 139 05/06/2012 1645   K 3.5 11/20/2017 0520   K 3.9 05/06/2012 1645   CL 102 11/20/2017 0520   CL 110 (H) 05/06/2012 1645   CO2 28 11/20/2017 0520   CO2 22 05/06/2012 1645   BUN 8 11/20/2017 0520   BUN 3 (L) 07/24/2017 1416   BUN 9 05/06/2012 1645   CREATININE 0.71 11/20/2017 0520   CREATININE 0.43 (L) 07/06/2015 1356   GLUCOSE 124 (H) 11/20/2017 0520   GLUCOSE 84 05/06/2012 1645   CALCIUM 8.9 11/20/2017 0520   CALCIUM 8.3 (L) 05/06/2012 1645   CBC:    Component Value Date/Time   WBC 21.4 (H) 11/20/2017 0520   HGB 7.7 (L) 11/20/2017 0520   HGB 9.3 (L) 07/24/2017 1421   HCT 22.0 (L) 11/20/2017 0520   HCT 28.9 (L) 07/24/2017 1421   PLT 527 (H) 11/20/2017 0520   PLT 666 (H) 07/24/2017 1421   MCV 89.1 11/20/2017 0520   MCV 83 07/24/2017 1421   MCV 94 05/11/2012 0721   NEUTROABS 17.5 (H) 11/20/2017 0520   NEUTROABS 5.4 07/24/2017 1421   NEUTROABS 10.9 (H) 05/10/2012 0552   LYMPHSABS 1.5 11/20/2017 0520   LYMPHSABS 1.5 07/24/2017 1421   LYMPHSABS 3.5 05/10/2012 0552   MONOABS 2.4 (H) 11/20/2017 0520   MONOABS 1.7 (H) 05/10/2012 0552   EOSABS 0.0 11/20/2017 0520   EOSABS 0.4  07/24/2017 1421   EOSABS 0.8 (H) 05/10/2012 0552   BASOSABS 0.0 11/20/2017 0520   BASOSABS 0.1 07/24/2017 1421   BASOSABS 0.1 05/10/2012 0552    No results found for this or any previous visit (from the past 240 hour(s)).  Studies/Results: Dg Chest 2 View  Result Date: 11/19/2017 CLINICAL DATA:  Elevated WBC - sickle cell - current smoker per chart EXAM: CHEST - 2 VIEW COMPARISON:  Chest x-rays dated 10/05/2017 and 05/27/2017. FINDINGS: Stable cardiomegaly. Linear opacity at the RIGHT lung base, likely atelectasis. No pleural effusion or pneumothorax seen. RIGHT chest wall Port-A-Cath appears adequately positioned with tip at the level of the mid SVC. No acute or  suspicious osseous finding. Sclerotic changes again noted at the bilateral heads bilaterally, compatible with chronic AVN. Additional chronic sequela of sickle cell within the thoracolumbar spine. IMPRESSION: 1. Probable atelectasis at the RIGHT lung base. Lungs otherwise clear. 2. Stable cardiomegaly. 3. Bilateral AVN of the humeral heads. Electronically Signed   By: Franki Cabot M.D.   On: 11/19/2017 16:30    Medications: Scheduled Meds: . enoxaparin (LOVENOX) injection  40 mg Subcutaneous Q24H  . folic acid  1 mg Oral Daily  . HYDROmorphone   Intravenous Q4H  . hydroxyurea  1,500 mg Oral Daily  . ketorolac  30 mg Intravenous Q6H  . morphine  30 mg Oral Q12H  . senna-docusate  1 tablet Oral BID   Continuous Infusions: . sodium chloride Stopped (11/19/17 2202)  . dextrose 5 % and 0.45% NaCl 125 mL/hr at 11/20/17 0523  . diphenhydrAMINE     PRN Meds:.aspirin-acetaminophen-caffeine, diphenhydrAMINE **OR** diphenhydrAMINE, naloxone **AND** sodium chloride flush, ondansetron, polyethylene glycol  Consultants:  None  Assessment/Plan: Principal Problem:   Hb-SS disease with crisis ([place]) Active Problems:   Chronic pain   Substance abuse ([name])   Thrombocytosis ([name])   Sickle cell anemia ([name])   Leucocytosis   1. Hb Sickle Cell Disease with crisis: Continue IVF D5 .45% Saline @ 125 mls/hour, continue weight based Dilaudid PCA, IV Toradol 30 mg Q 6 H, Monitor vitals very closely, Re-evaluate pain scale per protocol 2. Leukocytosis:  WBC count trending down, will continue to monitor  3. Chronic pain Syndrome:  Continue MS Contin 30 mg every 12 hours for chronic pain   4. Substance abuse:  Previous drug screen positive for cocaine on 10/18/2017. Awaiting UDS results, will continue to follow.  5. Thrombocytosis Will continue to monitor   Code Status: Full Code Family Communication: N/A Disposition Plan: Not yet ready for discharge  Donia Pounds  MSN, FNP-C Patient [address] [place], [place] [address] [phone]  If 5PM-7AM, please contact night-coverage.  11/20/2017, 9:33 AM  LOS: 1 day

## 2017-11-21 ENCOUNTER — Telehealth: Payer: Self-pay

## 2017-11-21 LAB — URINALYSIS, COMPLETE (UACMP) WITH MICROSCOPIC
BILIRUBIN URINE: NEGATIVE
Bacteria, UA: NONE SEEN
Glucose, UA: NEGATIVE mg/dL
Hgb urine dipstick: NEGATIVE
KETONES UR: NEGATIVE mg/dL
LEUKOCYTES UA: NEGATIVE
NITRITE: NEGATIVE
PROTEIN: NEGATIVE mg/dL
SPECIFIC GRAVITY, URINE: 1 — AB (ref 1.005–1.030)
pH: 6 (ref 5.0–8.0)

## 2017-11-21 LAB — RAPID URINE DRUG SCREEN, HOSP PERFORMED
Amphetamines: NOT DETECTED
Benzodiazepines: NOT DETECTED
Cocaine: NOT DETECTED
OPIATES: POSITIVE — AB
TETRAHYDROCANNABINOL: NOT DETECTED

## 2017-11-21 NOTE — Telephone Encounter (Signed)
Patient is already established with [facility] an saw them on 08/07/2017. Patient had another appointment on 08/21/2017 that she no showed. Patient needs to call and schedule another appointment. [phone].

## 2017-11-21 NOTE — Progress Notes (Signed)
Subjective: [name], a 27 year old female with a history of sickle cell anemia, HbSS was admitted to inpatient services on 11/19/2017 in sickle cell crisis. Patient says that she continues to have back pain that is interfering with ambulation. Patient says that pain is improving and she has been able to get up to the bathroom with minimal assistance. Current pain intensity is 8/10 characterized as constant and throbbing.   Objective:  Vital signs in last 24 hours:  Vitals:   11/20/17 2338 11/21/17 0222 11/21/17 0613 11/21/17 0613  BP:  115/74  96/60  Pulse:  79  81  Resp: 16 12 14 15   Temp:  99 F (37.2 C)  98.6 F (37 C)  TempSrc:  Oral  Oral  SpO2: 99% 99% 98% 99%  Weight:    200 lb 9.9 oz (91 kg)  Height:        Intake/Output from previous day:   Intake/Output Summary (Last 24 hours) at 11/21/2017 0933 Last data filed at 11/20/2017 1752 Gross per 24 hour  Intake 360 ml  Output -  Net 360 ml    Physical Exam: General: Alert, awake, oriented x3, in no acute distress.  HEENT: [place]/AT PEERL, EOMI Neck: Trachea midline,  no masses, no thyromegal,y no JVD, no carotid bruit OROPHARYNX:  Moist, No exudate/ erythema/lesions.  Heart: Regular rate and rhythm, without murmurs, rubs, gallops, PMI non-displaced, no heaves or thrills on palpation.  Lungs: Clear to auscultation, no wheezing or rhonchi noted. No increased vocal fremitus resonant to percussion  Abdomen: Soft, nontender, nondistended, positive bowel sounds, no masses no hepatosplenomegaly noted..  Neuro: No focal neurological deficits noted cranial nerves II through XII grossly intact. DTRs 2+ bilaterally upper and lower extremities. Strength 5 out of 5 in bilateral upper and lower extremities. Musculoskeletal: No warm swelling or erythema around joints, no spinal tenderness noted. Psychiatric: Patient alert and oriented x3, good insight and cognition, good recent to remote recall. Lymph node survey: No cervical axillary  or inguinal lymphadenopathy noted.  Lab Results:  Basic Metabolic Panel:    Component Value Date/Time   NA 138 11/20/2017 0520   NA 143 07/24/2017 1416   NA 139 05/06/2012 1645   K 3.5 11/20/2017 0520   K 3.9 05/06/2012 1645   CL 102 11/20/2017 0520   CL 110 (H) 05/06/2012 1645   CO2 28 11/20/2017 0520   CO2 22 05/06/2012 1645   BUN 8 11/20/2017 0520   BUN 3 (L) 07/24/2017 1416   BUN 9 05/06/2012 1645   CREATININE 0.71 11/20/2017 0520   CREATININE 0.43 (L) 07/06/2015 1356   GLUCOSE 124 (H) 11/20/2017 0520   GLUCOSE 84 05/06/2012 1645   CALCIUM 8.9 11/20/2017 0520   CALCIUM 8.3 (L) 05/06/2012 1645   CBC:    Component Value Date/Time   WBC 21.4 (H) 11/20/2017 0520   HGB 7.7 (L) 11/20/2017 0520   HGB 9.3 (L) 07/24/2017 1421   HCT 22.0 (L) 11/20/2017 0520   HCT 28.9 (L) 07/24/2017 1421   PLT 527 (H) 11/20/2017 0520   PLT 666 (H) 07/24/2017 1421   MCV 89.1 11/20/2017 0520   MCV 83 07/24/2017 1421   MCV 94 05/11/2012 0721   NEUTROABS 17.5 (H) 11/20/2017 0520   NEUTROABS 5.4 07/24/2017 1421   NEUTROABS 10.9 (H) 05/10/2012 0552   LYMPHSABS 1.5 11/20/2017 0520   LYMPHSABS 1.5 07/24/2017 1421   LYMPHSABS 3.5 05/10/2012 0552   MONOABS 2.4 (H) 11/20/2017 0520   MONOABS 1.7 (H) 05/10/2012 1093  EOSABS 0.0 11/20/2017 0520   EOSABS 0.4 07/24/2017 1421   EOSABS 0.8 (H) 05/10/2012 0552   BASOSABS 0.0 11/20/2017 0520   BASOSABS 0.1 07/24/2017 1421   BASOSABS 0.1 05/10/2012 0552    No results found for this or any previous visit (from the past 240 hour(s)).  Studies/Results: Dg Chest 2 View  Result Date: 11/19/2017 CLINICAL DATA:  Elevated WBC - sickle cell - current smoker per chart EXAM: CHEST - 2 VIEW COMPARISON:  Chest x-rays dated 10/05/2017 and 05/27/2017. FINDINGS: Stable cardiomegaly. Linear opacity at the RIGHT lung base, likely atelectasis. No pleural effusion or pneumothorax seen. RIGHT chest wall Port-A-Cath appears adequately positioned with tip at the level of  the mid SVC. No acute or suspicious osseous finding. Sclerotic changes again noted at the bilateral heads bilaterally, compatible with chronic AVN. Additional chronic sequela of sickle cell within the thoracolumbar spine. IMPRESSION: 1. Probable atelectasis at the RIGHT lung base. Lungs otherwise clear. 2. Stable cardiomegaly. 3. Bilateral AVN of the humeral heads. Electronically Signed   By: Franki Cabot M.D.   On: 11/19/2017 16:30    Medications: Scheduled Meds: . enoxaparin (LOVENOX) injection  40 mg Subcutaneous Q24H  . folic acid  1 mg Oral Daily  . HYDROmorphone   Intravenous Q4H  . hydroxyurea  1,500 mg Oral Daily  . ketorolac  30 mg Intravenous Q6H  . morphine  30 mg Oral Q12H  . senna-docusate  1 tablet Oral BID   Continuous Infusions: . dextrose 5 % and 0.45% NaCl 125 mL/hr at 11/20/17 1411  . diphenhydrAMINE     PRN Meds:.aspirin-acetaminophen-caffeine, diphenhydrAMINE **OR** diphenhydrAMINE, naloxone **AND** sodium chloride flush, ondansetron, polyethylene glycol  Consultants:  None  Assessment/Plan: Principal Problem:   Hb-SS disease with crisis ([place]) Active Problems:   Chronic pain   Substance abuse ([name])   Thrombocytosis ([name])   Sickle cell anemia ([name])   Leucocytosis   1. Hb Sickle Cell Disease with crisis: Decreased D5.45 to 50 ml/hr, continue weight based Dilaudid PCA, IV Toradol 30 mg Q 6 hours. Will repeat labs in am.          Monitor vitals very closely, Re-evaluate pain scale per protocol.   2. Leukocytosis:  WBC count trending down, will continue to monitor  3. Chronic pain Syndrome:  Continue MS Contin 30 mg every 12 hours for chronic pain   4. Substance abuse:  Reviewed UDS, negative for illicit drugs    Code Status: Full Code Family Communication: N/A Disposition Plan: Not yet ready for discharge  Donia Pounds  MSN, FNP-C Patient [address] [place], [place]  [address] [phone]  If 5PM-7AM, please contact night-coverage.  11/21/2017, 9:33 AM  LOS: 2 days

## 2017-11-22 ENCOUNTER — Telehealth: Payer: Self-pay

## 2017-11-22 LAB — CBC WITH DIFFERENTIAL/PLATELET
BASOS ABS: 0.1 10*3/uL (ref 0.0–0.1)
Basophils Relative: 0 %
Eosinophils Absolute: 0.4 10*3/uL (ref 0.0–0.7)
Eosinophils Relative: 3 %
HEMATOCRIT: 20.2 % — AB (ref 36.0–46.0)
Hemoglobin: 6.8 g/dL — CL (ref 12.0–15.0)
LYMPHS ABS: 2.6 10*3/uL (ref 0.7–4.0)
LYMPHS PCT: 23 %
MCH: 30.5 pg (ref 26.0–34.0)
MCHC: 33.7 g/dL (ref 30.0–36.0)
MCV: 90.6 fL (ref 78.0–100.0)
MONO ABS: 1.1 10*3/uL — AB (ref 0.1–1.0)
MONOS PCT: 10 %
NEUTROS ABS: 7.4 10*3/uL (ref 1.7–7.7)
Neutrophils Relative %: 64 %
Platelets: 481 10*3/uL — ABNORMAL HIGH (ref 150–400)
RBC: 2.23 MIL/uL — ABNORMAL LOW (ref 3.87–5.11)
RDW: 18.7 % — AB (ref 11.5–15.5)
WBC: 11.5 10*3/uL — ABNORMAL HIGH (ref 4.0–10.5)

## 2017-11-22 LAB — COMPREHENSIVE METABOLIC PANEL
ALT: 12 U/L (ref 0–44)
AST: 13 U/L — AB (ref 15–41)
Albumin: 3.4 g/dL — ABNORMAL LOW (ref 3.5–5.0)
Alkaline Phosphatase: 61 U/L (ref 38–126)
Anion gap: 6 (ref 5–15)
BILIRUBIN TOTAL: 1.9 mg/dL — AB (ref 0.3–1.2)
BUN: 7 mg/dL (ref 6–20)
CO2: 29 mmol/L (ref 22–32)
CREATININE: 0.66 mg/dL (ref 0.44–1.00)
Calcium: 8.5 mg/dL — ABNORMAL LOW (ref 8.9–10.3)
Chloride: 106 mmol/L (ref 98–111)
GFR calc Af Amer: >60 mL/min (ref >60)
Glucose, Bld: 99 mg/dL (ref 70–99)
POTASSIUM: 3.6 mmol/L (ref 3.5–5.1)
Sodium: 141 mmol/L (ref 135–145)
TOTAL PROTEIN: 6.2 g/dL — AB (ref 6.5–8.1)

## 2017-11-22 MED ORDER — SODIUM CHLORIDE 0.9% FLUSH
10.0000 mL | Freq: Two times a day (BID) | INTRAVENOUS | Status: DC
  Administered 2017-11-22 – 2017-11-23 (×2): 10 mL

## 2017-11-22 MED ORDER — OXYCODONE HCL 5 MG PO TABS: 20.0000 mg | ORAL_TABLET | Freq: Once | ORAL | Status: DC

## 2017-11-22 MED ORDER — SODIUM CHLORIDE 0.9% FLUSH: 10.0000 mL | INTRAVENOUS | Status: DC | PRN

## 2017-11-22 NOTE — Telephone Encounter (Signed)
Referral was faxed to [facility] pain clinic on 11/22/2017.

## 2017-11-22 NOTE — Progress Notes (Signed)
Subjective: [name], a 27 year old female with a history of sickle cell anemia, HbSS was admitted to inpatient services on 11/19/2017 in sickle cell crisis. Patient says that she continues to have back pain that is interfering with ambulation. Patient says that pain is improving and she has been able to get up to the bathroom with minimal assistance. Current pain intensity is 7/10 characterized as intermittent and aching.   Objective:  Vital signs in last 24 hours:  Vitals:   11/22/17 0230 11/22/17 0333 11/22/17 0620 11/22/17 0738  BP: 113/63  110/69   Pulse: 78  73   Resp: 16 17 16 17   Temp: 98.6 F (37 C)  98.6 F (37 C)   TempSrc: Oral  Oral   SpO2: 99% 98% 100% 96%  Weight:   203 lb 7.8 oz (92.3 kg)   Height:        Intake/Output from previous day:   Intake/Output Summary (Last 24 hours) at 11/22/2017 0902 Last data filed at 11/21/2017 2115 Gross per 24 hour  Intake 720 ml  Output 800 ml  Net -80 ml    Physical Exam: General: Alert, awake, oriented x3, in no acute distress.  HEENT: [place]/AT PEERL, EOMI Neck: Trachea midline,  no masses, no thyromegal,y no JVD, no carotid bruit OROPHARYNX:  Moist, No exudate/ erythema/lesions.  Heart: Regular rate and rhythm, without murmurs, rubs, gallops, PMI non-displaced, no heaves or thrills on palpation.  Lungs: Clear to auscultation, no wheezing or rhonchi noted. No increased vocal fremitus resonant to percussion  Abdomen: Soft, nontender, nondistended, positive bowel sounds, no masses no hepatosplenomegaly noted..  Neuro: No focal neurological deficits noted cranial nerves II through XII grossly intact. DTRs 2+ bilaterally upper and lower extremities. Strength 5 out of 5 in bilateral upper and lower extremities. Musculoskeletal: No warm swelling or erythema around joints, no spinal tenderness noted. Psychiatric: Patient alert and oriented x3, good insight and cognition, good recent to remote recall. Lymph node survey: No cervical  axillary or inguinal lymphadenopathy noted.  Lab Results:  Basic Metabolic Panel:    Component Value Date/Time   NA 141 11/22/2017 0731   NA 143 07/24/2017 1416   NA 139 05/06/2012 1645   K 3.6 11/22/2017 0731   K 3.9 05/06/2012 1645   CL 106 11/22/2017 0731   CL 110 (H) 05/06/2012 1645   CO2 29 11/22/2017 0731   CO2 22 05/06/2012 1645   BUN 7 11/22/2017 0731   BUN 3 (L) 07/24/2017 1416   BUN 9 05/06/2012 1645   CREATININE 0.66 11/22/2017 0731   CREATININE 0.43 (L) 07/06/2015 1356   GLUCOSE 99 11/22/2017 0731   GLUCOSE 84 05/06/2012 1645   CALCIUM 8.5 (L) 11/22/2017 0731   CALCIUM 8.3 (L) 05/06/2012 1645   CBC:    Component Value Date/Time   WBC 11.5 (H) 11/22/2017 0731   HGB 6.8 (LL) 11/22/2017 0731   HGB 9.3 (L) 07/24/2017 1421   HCT 20.2 (L) 11/22/2017 0731   HCT 28.9 (L) 07/24/2017 1421   PLT 481 (H) 11/22/2017 0731   PLT 666 (H) 07/24/2017 1421   MCV 90.6 11/22/2017 0731   MCV 83 07/24/2017 1421   MCV 94 05/11/2012 0721   NEUTROABS 7.4 11/22/2017 0731   NEUTROABS 5.4 07/24/2017 1421   NEUTROABS 10.9 (H) 05/10/2012 0552   LYMPHSABS 2.6 11/22/2017 0731   LYMPHSABS 1.5 07/24/2017 1421   LYMPHSABS 3.5 05/10/2012 0552   MONOABS 1.1 (H) 11/22/2017 0731   MONOABS 1.7 (H) 05/10/2012 3536  EOSABS 0.4 11/22/2017 0731   EOSABS 0.4 07/24/2017 1421   EOSABS 0.8 (H) 05/10/2012 0552   BASOSABS 0.1 11/22/2017 0731   BASOSABS 0.1 07/24/2017 1421   BASOSABS 0.1 05/10/2012 0552    No results found for this or any previous visit (from the past 240 hour(s)).  Studies/Results: No results found.  Medications: Scheduled Meds: . enoxaparin (LOVENOX) injection  40 mg Subcutaneous Q24H  . folic acid  1 mg Oral Daily  . HYDROmorphone   Intravenous Q4H  . hydroxyurea  1,500 mg Oral Daily  . ketorolac  30 mg Intravenous Q6H  . morphine  30 mg Oral Q12H  . senna-docusate  1 tablet Oral BID  . sodium chloride flush  10-40 mL Intracatheter Q12H   Continuous Infusions: .  dextrose 5 % and 0.45% NaCl 125 mL/hr at 11/20/17 1411  . diphenhydrAMINE     PRN Meds:.aspirin-acetaminophen-caffeine, diphenhydrAMINE **OR** diphenhydrAMINE, naloxone **AND** sodium chloride flush, ondansetron, polyethylene glycol, sodium chloride flush  Consultants:  None  Assessment/Plan: Principal Problem:   Hb-SS disease with crisis ([place]) Active Problems:   Chronic pain   Substance abuse ([name])   Thrombocytosis ([name])   Sickle cell anemia ([name])   Leucocytosis   1. Hb Sickle Cell Disease with crisis:  Decreased D5.45 to 50 ml/hr, continue weight based Dilaudid PCA, IV Toradol 30 mg Q 6 hours. Will repeat labs in am.  Monitor vitals very closely, Re-evaluate pain scale per protocol. Hemoglobin decreased to 6.8 overnight. IVF was not adjusted to 50 ml/hour on 11/21/2017. I suspect that decrease in hgb is artificially reduced due to  IVF. Will recheck H&H in am. Plan is to discharge in am.   2. Leukocytosis:  WBC continues to trend down, will continue to monitor  2. Chronic pain Syndrome:  Continue MS Contin 30 mg every 12 hours for chronic pain   4. Substance abuse:  Reviewed UDS, negative for illicit drugs. Notified primary provider concerning pain management for discharge planning. Patient has established care at [facility]. She will need to schedule appointment at discharge.     Code Status: Full Code Family Communication: N/A Disposition Plan: Not yet ready for discharge  Donia Pounds  MSN, FNP-C [facility] [place], [place] [address] [phone]  If 5PM-7AM, please contact night-coverage.  11/22/2017, 9:02 AM  LOS: 3 days

## 2017-11-22 NOTE — Care Management Important Message (Signed)
Important Message  Patient Details  Name: [name] MRN: [id number] Date of Birth: 22-Jun-1990   Medicare Important Message Given:  Yes    Kerin Salen 11/22/2017, 11:02 AMImportant Message  Patient Details  Name: [name] MRN: [id number] Date of Birth: 10-02-1990   Medicare Important Message Given:  Yes    Kerin Salen 11/22/2017, 11:02 AM

## 2017-11-23 LAB — HEMOGLOBIN AND HEMATOCRIT, BLOOD
HCT: 20.2 % — ABNORMAL LOW (ref 36.0–46.0)
HEMOGLOBIN: 6.9 g/dL — AB (ref 12.0–15.0)

## 2017-11-23 MED ORDER — MORPHINE SULFATE ER 30 MG PO TBCR: 30.0000 mg | EXTENDED_RELEASE_TABLET | Freq: Two times a day (BID) | ORAL | 0 refills | Status: DC

## 2017-11-23 MED ORDER — OXYCODONE HCL 5 MG PO TABS
20.0000 mg | ORAL_TABLET | Freq: Once | ORAL | Status: AC
  Administered 2017-11-23: 20 mg via ORAL
  Filled 2017-11-23: qty 4

## 2017-11-23 MED ORDER — HEPARIN SOD (PORK) LOCK FLUSH 100 UNIT/ML IV SOLN
500.0000 [IU] | Freq: Once | INTRAVENOUS | Status: AC
  Administered 2017-11-23: 500 [IU]
  Filled 2017-11-23: qty 5

## 2017-11-23 MED ORDER — OXYCODONE HCL 20 MG PO TABS: 20.0000 mg | ORAL_TABLET | Freq: Four times a day (QID) | ORAL | 0 refills | Status: DC | PRN

## 2017-11-23 NOTE — Discharge Instructions (Signed)
Sickle Cell Anemia, Adult °Sickle cell anemia is a condition where your red blood cells are shaped like sickles. Red blood cells carry oxygen through the body. Sickle-shaped red blood cells do not live as long as normal red blood cells. They also clump together and block blood from flowing through the blood vessels. These things prevent the body from getting enough oxygen. Sickle cell anemia causes organ damage and pain. It also increases the risk of infection. °Follow these instructions at home: °· Drink enough fluid to keep your pee (urine) clear or pale yellow. Drink more in hot weather and during exercise. °· Do not smoke. Smoking lowers oxygen levels in the blood. °· Only take over-the-counter or prescription medicines as told by your doctor. °· Take antibiotic medicines as told by your doctor. Make sure you finish them even if you start to feel better. °· Take supplements as told by your doctor. °· Consider wearing a medical alert bracelet. This tells anyone caring for you in an emergency of your condition. °· When traveling, keep your medical information, doctors' names, and the medicines you take with you at all times. °· If you have a fever, do not take fever medicines right away. This could cover up a problem. Tell your doctor. °· Keep all follow-up visits with your doctor. Sickle cell anemia requires regular medical care. °Contact a doctor if: °You have a fever. °Get help right away if: °· You feel dizzy or faint. °· You have new belly (abdominal) pain, especially on the left side near the stomach area. °· You have a lasting, often uncomfortable and painful erection of the penis (priapism). If it is not treated right away, you will become unable to have sex (impotence). °· You have numbness in your arms or legs or you have a hard time moving them. °· You have a hard time talking. °· You have a fever or lasting symptoms for more than 2-3 days. °· You have a fever and your symptoms suddenly get  worse. °· You have signs or symptoms of infection. These include: °? Chills. °? Being more tired than normal (lethargy). °? Irritability. °? Poor eating. °? Throwing up (vomiting). °· You have pain that is not helped with medicine. °· You have shortness of breath. °· You have pain in your chest. °· You are coughing up pus-like or bloody mucus. °· You have a stiff neck. °· Your feet or hands swell or have pain. °· Your belly looks bloated. °· Your joints hurt. °This information is not intended to replace advice given to you by your health care provider. Make sure you discuss any questions you have with your health care provider. °Document Released: 02/20/2013 Document Revised: 10/08/2015 Document Reviewed: 12/12/2012 °Elsevier Interactive Patient Education © 2017 Elsevier Inc. °Chronic Pain, Adult °Chronic pain is a type of pain that lasts or keeps coming back (recurs) for at least six months. You may have chronic headaches, abdominal pain, or body pain. Chronic pain may be related to an illness, such as fibromyalgia or complex regional pain syndrome. Sometimes the cause of chronic pain is not known. °Chronic pain can make it hard for you to do daily activities. If not treated, chronic pain can lead to other health problems, including anxiety and depression. Treatment depends on the cause and severity of your pain. You may need to work with a pain specialist to come up with a treatment plan. The plan may include medicine, counseling, and physical therapy. Many people benefit from a combination of   two or more types of treatment to control their pain. °Follow these instructions at home: °Lifestyle °· Consider keeping a pain diary to share with your health care providers. °· Consider talking with a mental health care provider (psychologist) about how to cope with chronic pain. °· Consider joining a chronic pain support group. °· Try to control or lower your stress levels. Talk to your health care provider about  strategies to do this. °General instructions ° °· Take over-the-counter and prescription medicines only as told by your health care provider. °· Follow your treatment plan as told by your health care provider. This may include: °? Gentle, regular exercise. °? Eating a healthy diet that includes foods such as vegetables, fruits, fish, and lean meats. °? Cognitive or behavioral therapy. °? Working with a physical therapist. °? Meditation or yoga. °? Acupuncture or massage therapy. °? Aroma, color, light, or sound therapy. °? Local electrical stimulation. °? Shots (injections) of numbing or pain-relieving medicines into the spine or the area of pain. °· Check your pain level as told by your health care provider. Ask your health care provider if you should use a pain scale. °· Learn as much as you can about how to manage your chronic pain. Ask your health care provider if an intensive pain rehabilitation program or a chronic pain specialist would be helpful. °· Keep all follow-up visits as told by your health care provider. This is important. °Contact a health care provider if: °· Your pain gets worse. °· You have new pain. °· You have trouble sleeping. °· You have trouble doing your normal activities. °· Your pain is not controlled with treatment. °· Your have side effects from pain medicine. °· You feel weak. °Get help right away if: °· You lose feeling or have numbness in your body. °· You lose control of bowel or bladder function. °· Your pain suddenly gets much worse. °· You develop shaking or chills. °· You develop confusion. °· You develop chest pain. °· You have trouble breathing or shortness of breath. °· You pass out. °· You have thoughts about hurting yourself or others. °This information is not intended to replace advice given to you by your health care provider. Make sure you discuss any questions you have with your health care provider. °Document Released: 01/22/2002 Document Revised: 12/31/2015 Document  Reviewed: 10/20/2015 °Elsevier Interactive Patient Education © 2018 Elsevier Inc. ° °

## 2017-11-23 NOTE — Consult Note (Signed)
   [facility] CM Inpatient Consult   11/23/2017  [name] 08/22/1990 [id number]   Went to bedside to speak with [name] just prior to hospital discharge. Discussed [facility] follow up. [name] pleasantly declined and states she still has [place] [facility] contact information to call if needed. States she does not need additional brochure or 24-hr nurse advice line magnet.   Sent notification to inpatient RNCM to make aware patient declined [facility].    Marthenia Rolling, MSN-Ed, RN,BSN [facility] Liaison [phone]

## 2017-11-23 NOTE — Progress Notes (Signed)
Patient ambulated on hallway last night with out no difficulties.

## 2017-11-23 NOTE — Discharge Summary (Signed)
Physician Discharge Summary  [name] RWE:[id number] DOB: Jan 20, 1991 DOA: 11/19/2017  PCP: Azzie Glatter, FNP  Admit date: 11/19/2017  Discharge date: 11/23/2017  Discharge Diagnoses:  Principal Problem:   Hb-SS disease with crisis ([place]) Active Problems:   Chronic pain   Substance abuse ([place])   Thrombocytosis ([place])   Sickle cell anemia ([place])   Leucocytosis   Discharge Condition: Stable  Disposition:  Follow-up Information    Azzie Glatter, FNP Follow up in 1 week(s).   Specialty:  Family Medicine Why:  Follow up in 1 week at [facility] [place], to repeat CBC  Contact information: [place] [address] [phone]        Jeanella Anton, NP .   Specialty:  Nurse Practitioner Contact information: [place] [address] [phone]          Pt is discharged home in good condition and is to follow up with Azzie Glatter, FNP in 1 week to have labs evaluated. [name] is instructed to increase activity slowly and balance with rest for the next few days, and use prescribed medication to complete treatment of pain  Diet: Regular Wt Readings from Last 3 Encounters:  11/23/17 205 lb 7.5 oz (93.2 kg)  10/18/17 207 lb (93.9 kg)  10/06/17 206 lb 5.6 oz (93.6 kg)    History of present illness:    Hospital Course:  Patient was admitted for sickle cell pain crisis and managed appropriately with IVF, IV Dilaudid via PCA and IV Toradol, as well as other adjunct therapies per sickle cell pain management protocols.  Patient was discharged home today in a hemodynamically stable condition.   Discharge Exam: Vitals:   11/23/17 0850 11/23/17 0939  BP:  (!) 105/55  Pulse:  81  Resp: 12 12  Temp:  98.2 F (36.8 C)  SpO2: 100% 97%   Vitals:   11/23/17 0543 11/23/17 0638 11/23/17 0850 11/23/17 0939  BP:  (!) 111/91  (!) 105/55  Pulse:  72  81  Resp: 14 15 12 12   Temp:  98 F (36.7 C)  98.2 F (36.8  C)  TempSrc:  Oral  Oral  SpO2: 98% 99% 100% 97%  Weight:  205 lb 7.5 oz (93.2 kg)    Height:        General appearance : Awake, alert, not in any distress. Speech Clear. Not toxic looking HEENT: Atraumatic and Normocephalic, pupils equally reactive to light and accomodation Neck: Supple, no JVD. No cervical lymphadenopathy.  Chest: Good air entry bilaterally, no added sounds  CVS: S1 S2 regular, no murmurs.  Abdomen: Bowel sounds present, Non tender and not distended with no gaurding, rigidity or rebound. Extremities: B/L Lower Ext shows no edema, both legs are warm to touch Neurology: Awake alert, and oriented X 3, CN II-XII intact, Non focal Skin: No Rash  Discharge Instructions   Allergies as of 11/23/2017      Reactions   Carrot [daucus [place]] Hives, Swelling   Allergic to carrots   Latex Rash      Medication List    TAKE these medications   aspirin-acetaminophen-caffeine 250-250-65 MG tablet Commonly known as:  EXCEDRIN MIGRAINE Take 2 tablets every 6 (six) hours as needed by mouth for headache.   cyclobenzaprine 10 MG tablet Commonly known as:  FLEXERIL Take 1 tablet (10 mg total) by mouth 3 (three) times daily as needed for muscle spasms.   folic acid 1 MG tablet Commonly known  as:  FOLVITE Take 1 mg by mouth daily.   gabapentin 300 MG capsule Commonly known as:  NEURONTIN Take 1 capsule (300 mg total) by mouth 3 (three) times daily.   hydroxyurea 500 MG capsule Commonly known as:  HYDREA Take 2 tabs (1000 mg ) PO QOD starting on 06/02/2017 alternating with 1 tab (500 mg) PO QOD starting on 06/03/2017. May take with food to minimize GI side effects. What changed:    how much to take  how to take this  when to take this  additional instructions   morphine 30 MG 12 hr tablet Commonly known as:  MS CONTIN Take 1 tablet (30 mg total) by mouth every 12 (twelve) hours.   Oxycodone HCl 20 MG Tabs Take 1 tablet (20 mg total) by mouth every 6 (six) hours  as needed (pain).       The results of significant diagnostics from this hospitalization (including imaging, microbiology, ancillary and laboratory) are listed below for reference.    Significant Diagnostic Studies: Dg Chest 2 View  Result Date: 11/19/2017 CLINICAL DATA:  Elevated WBC - sickle cell - current smoker per chart EXAM: CHEST - 2 VIEW COMPARISON:  Chest x-rays dated 10/05/2017 and 05/27/2017. FINDINGS: Stable cardiomegaly. Linear opacity at the RIGHT lung base, likely atelectasis. No pleural effusion or pneumothorax seen. RIGHT chest wall Port-A-Cath appears adequately positioned with tip at the level of the mid SVC. No acute or suspicious osseous finding. Sclerotic changes again noted at the bilateral heads bilaterally, compatible with chronic AVN. Additional chronic sequela of sickle cell within the thoracolumbar spine. IMPRESSION: 1. Probable atelectasis at the RIGHT lung base. Lungs otherwise clear. 2. Stable cardiomegaly. 3. Bilateral AVN of the humeral heads. Electronically Signed   By: Franki Cabot M.D.   On: 11/19/2017 16:30    Microbiology: No results found for this or any previous visit (from the past 240 hour(s)).   Labs: Basic Metabolic Panel: Recent Labs  Lab 11/19/17 1320 11/20/17 0520 11/22/17 0731  NA 139 138 141  K 3.6 3.5 3.6  CL 103 102 106  CO2 27 28 29   GLUCOSE 120* 124* 99  BUN 9 8 7   CREATININE 0.75 0.71 0.66  CALCIUM 9.0 8.9 8.5*   Liver Function Tests: Recent Labs  Lab 11/19/17 1320 11/20/17 0520 11/22/17 0731  AST 25 24 13*  ALT 15 16 12   ALKPHOS 83 64 61  BILITOT 3.7* 4.0* 1.9*  PROT 7.9 7.4 6.2*  ALBUMIN 4.6 4.2 3.4*   No results for input(s): LIPASE, AMYLASE in the last 168 hours. No results for input(s): AMMONIA in the last 168 hours. CBC: Recent Labs  Lab 11/19/17 1320 11/20/17 0520 11/22/17 0731 11/23/17 0610  WBC 26.3* 21.4* 11.5*  --   NEUTROABS 21.8* 17.5* 7.4  --   HGB 7.8* 7.7* 6.8* 6.9*  HCT 22.6* 22.0* 20.2*  20.2*  MCV 89.3 89.1 90.6  --   PLT 521* 527* 481*  --    Cardiac Enzymes: No results for input(s): CKTOTAL, CKMB, CKMBINDEX, TROPONINI in the last 168 hours. BNP: Invalid input(s): POCBNP CBG: No results for input(s): GLUCAP in the last 168 hours.  Time coordinating discharge: 50 minutes  Signed: Donia Pounds  MSN, FNP-C [facility] [address] [place], [place] [address] [phone]    [facility] 11/23/2017, 12:14 PM

## 2017-11-24 ENCOUNTER — Ambulatory Visit: Payer: Self-pay | Admitting: Family Medicine

## 2017-12-07 ENCOUNTER — Telehealth: Payer: Self-pay | Admitting: Family Medicine

## 2017-12-07 NOTE — Telephone Encounter (Signed)
[name] Resides, [place], [facility], called to notify the office that pt has been passing forged prescriptions to several pharmacies in the area and they are continuing to monitor

## 2017-12-17 IMAGING — CR DG CHEST 2V
2 series · 2 of 2 positions shown · non-contrast
Comparison: 11/24/2016

CLINICAL DATA: Sickle cell with chest pain

EXAM:
CHEST  2 VIEW

[w chest pa]
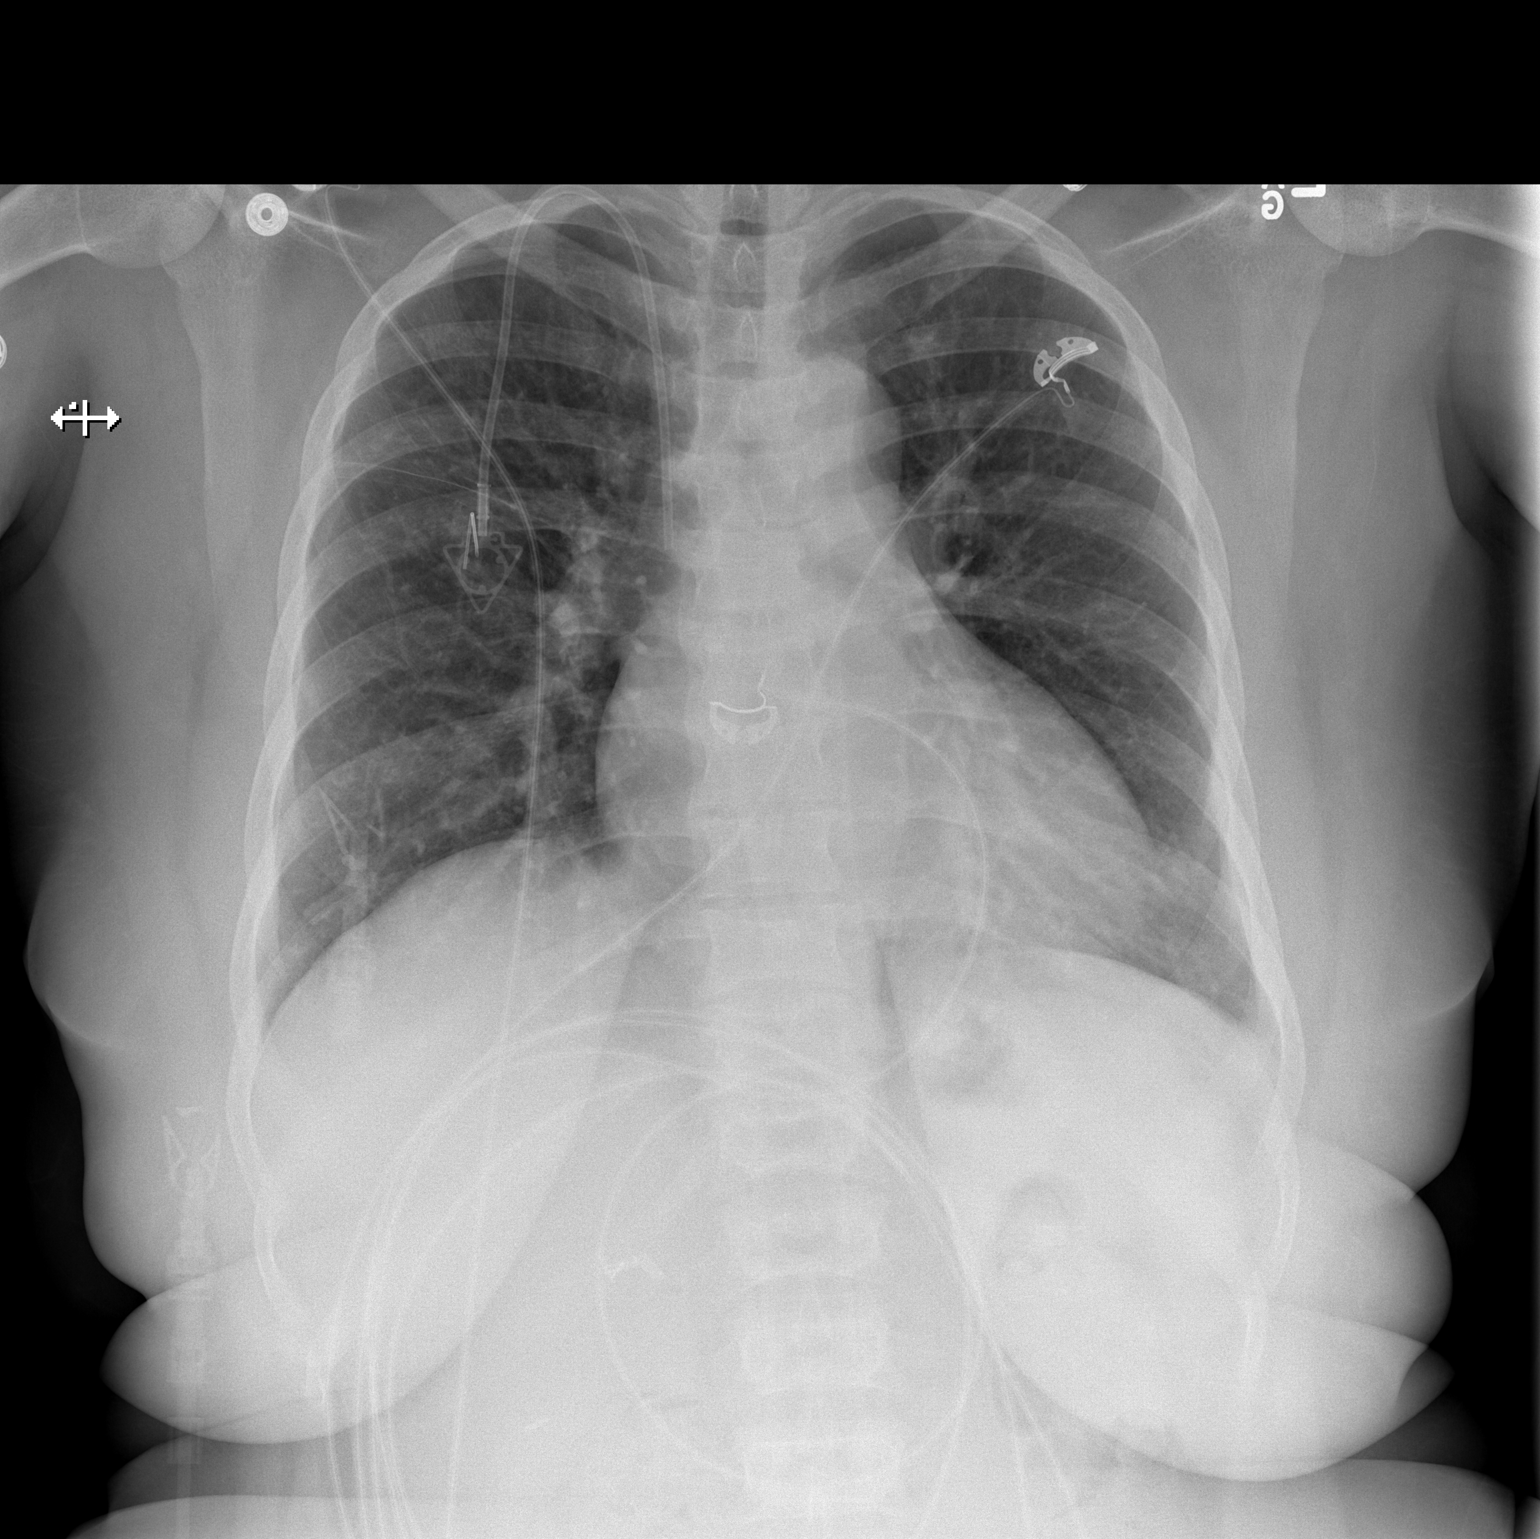

[w chest lat]
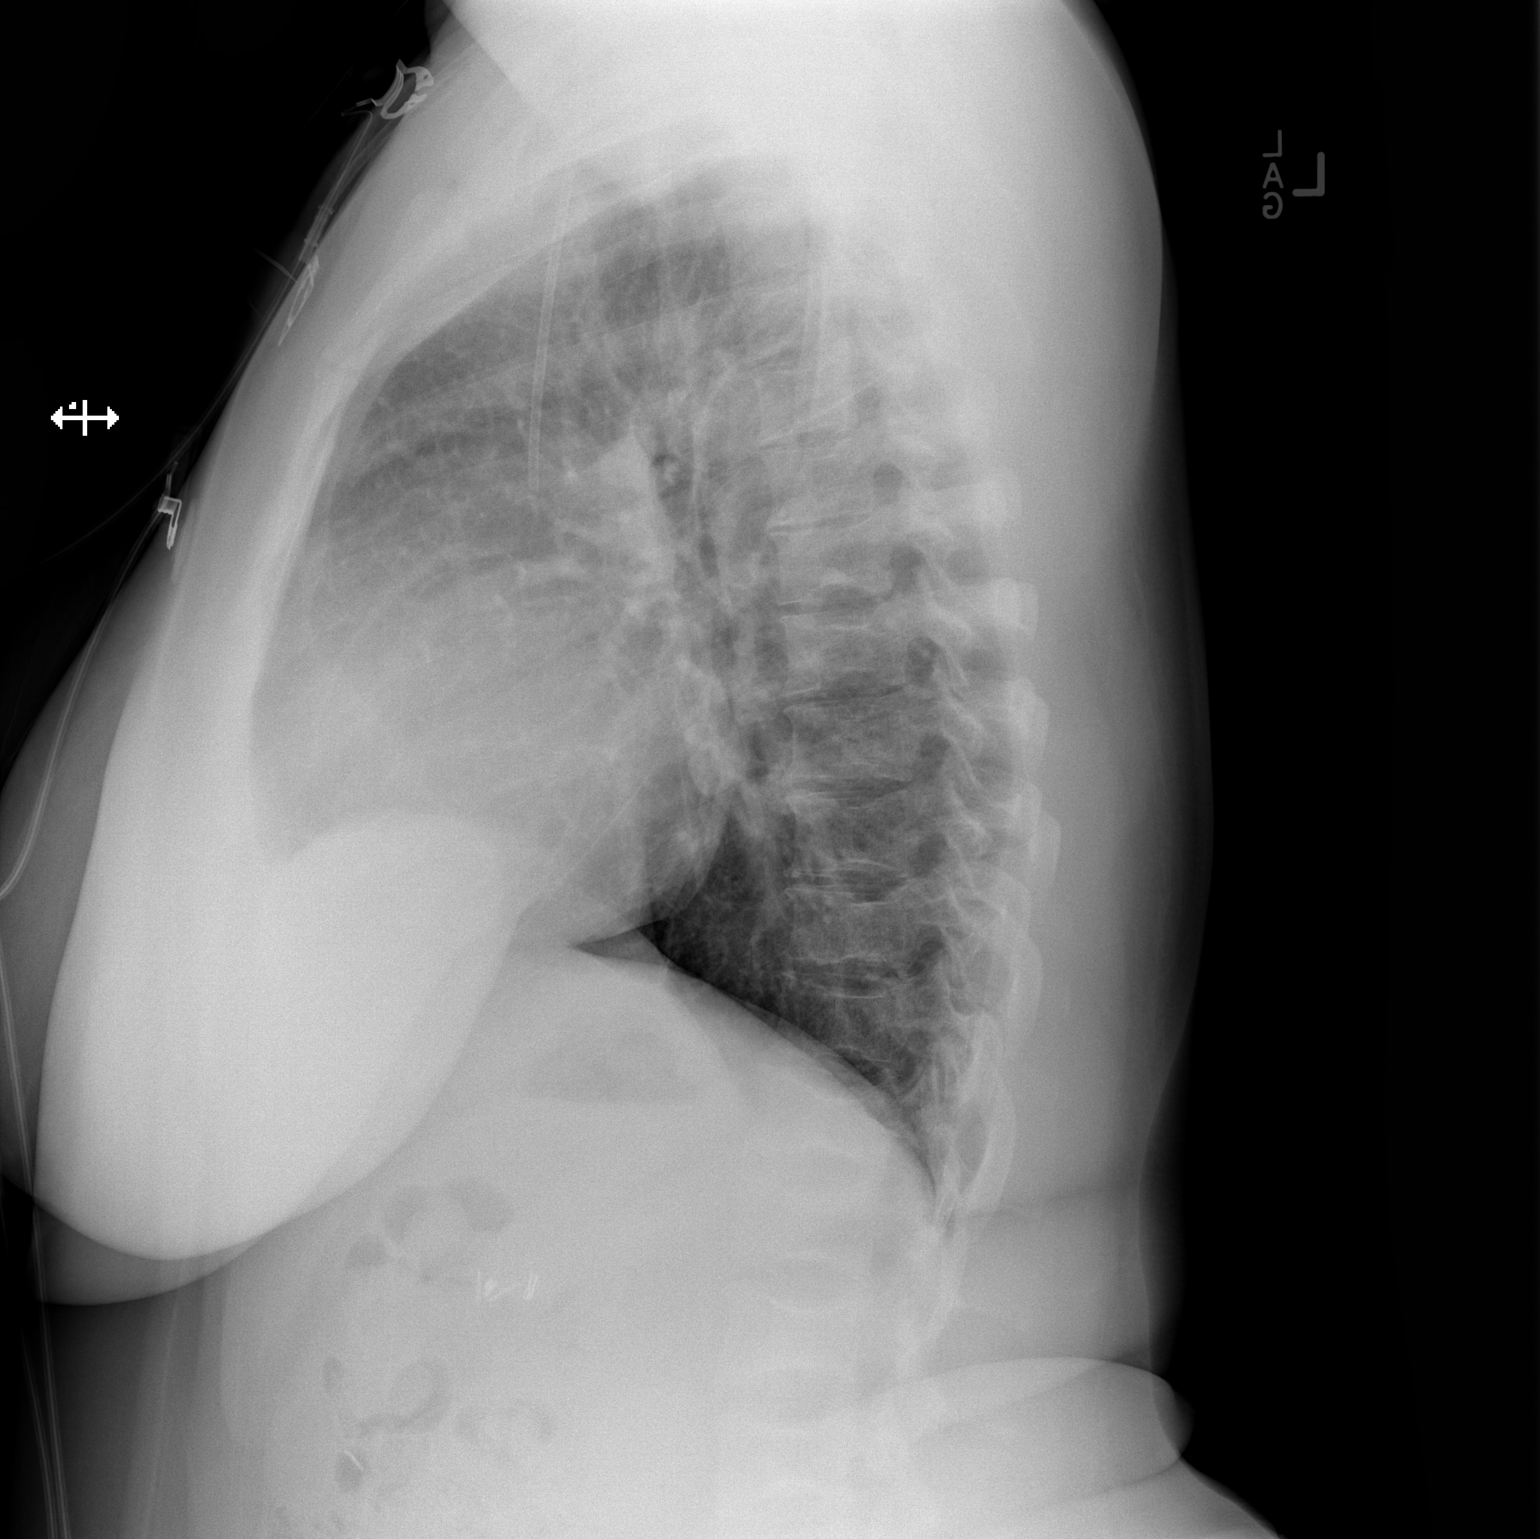

[2 of 2 positions shown; findings below may reference images not displayed]

FINDINGS: Right-sided central venous port tip overlies the SVC. No acute
pulmonary infiltrate, consolidation, or pleural effusion is seen.
Stable cardiomediastinal silhouette. No pneumothorax. Surgical clips
in the right upper quadrant. Osseous changes of the spine compatible
with history of sickle cell. Flattening of the humeral heads as
before.
IMPRESSION: No active cardiopulmonary disease.

## 2017-12-21 ENCOUNTER — Inpatient Hospital Stay (HOSPITAL_COMMUNITY)
Admission: EM | Admit: 2017-12-21 | Discharge: 2017-12-22 | DRG: 812 | Discharge status: Against Medical Advice | Payer: Medicare Other | Attending: Internal Medicine | Admitting: Internal Medicine

## 2017-12-21 ENCOUNTER — Encounter (HOSPITAL_COMMUNITY): Payer: Self-pay | Admitting: Emergency Medicine

## 2017-12-21 ENCOUNTER — Other Ambulatory Visit: Payer: Self-pay

## 2017-12-21 DIAGNOSIS — M79605 Pain in left leg: Secondary | ICD-10-CM | POA: Diagnosis not present

## 2017-12-21 DIAGNOSIS — Z832 Family history of diseases of the blood and blood-forming organs and certain disorders involving the immune mechanism: Secondary | ICD-10-CM

## 2017-12-21 DIAGNOSIS — D75839 Thrombocytosis, unspecified: Secondary | ICD-10-CM | POA: Diagnosis present

## 2017-12-21 DIAGNOSIS — D5700 Hb-SS disease with crisis, unspecified: Secondary | ICD-10-CM | POA: Diagnosis not present

## 2017-12-21 DIAGNOSIS — Z8759 Personal history of other complications of pregnancy, childbirth and the puerperium: Secondary | ICD-10-CM

## 2017-12-21 DIAGNOSIS — Z833 Family history of diabetes mellitus: Secondary | ICD-10-CM

## 2017-12-21 DIAGNOSIS — Z5321 Procedure and treatment not carried out due to patient leaving prior to being seen by health care provider: Secondary | ICD-10-CM | POA: Diagnosis not present

## 2017-12-21 DIAGNOSIS — G8929 Other chronic pain: Secondary | ICD-10-CM

## 2017-12-21 DIAGNOSIS — F17210 Nicotine dependence, cigarettes, uncomplicated: Secondary | ICD-10-CM | POA: Diagnosis present

## 2017-12-21 DIAGNOSIS — D638 Anemia in other chronic diseases classified elsewhere: Secondary | ICD-10-CM | POA: Diagnosis present

## 2017-12-21 DIAGNOSIS — Z91018 Allergy to other foods: Secondary | ICD-10-CM

## 2017-12-21 DIAGNOSIS — D473 Essential (hemorrhagic) thrombocythemia: Secondary | ICD-10-CM | POA: Diagnosis present

## 2017-12-21 DIAGNOSIS — Z9049 Acquired absence of other specified parts of digestive tract: Secondary | ICD-10-CM

## 2017-12-21 DIAGNOSIS — F1410 Cocaine abuse, uncomplicated: Secondary | ICD-10-CM | POA: Diagnosis present

## 2017-12-21 DIAGNOSIS — Z79899 Other long term (current) drug therapy: Secondary | ICD-10-CM

## 2017-12-21 DIAGNOSIS — G894 Chronic pain syndrome: Secondary | ICD-10-CM | POA: Diagnosis present

## 2017-12-21 DIAGNOSIS — Z8249 Family history of ischemic heart disease and other diseases of the circulatory system: Secondary | ICD-10-CM

## 2017-12-21 DIAGNOSIS — Z91040 Latex allergy status: Secondary | ICD-10-CM

## 2017-12-21 LAB — CBC WITH DIFFERENTIAL/PLATELET
BASOS PCT: 0 %
Basophils Absolute: 0 10*3/uL (ref 0.0–0.1)
Eosinophils Absolute: 0.4 10*3/uL (ref 0.0–0.7)
Eosinophils Relative: 3 %
HEMATOCRIT: 22.2 % — AB (ref 36.0–46.0)
HEMOGLOBIN: 7.6 g/dL — AB (ref 12.0–15.0)
LYMPHS PCT: 20 %
Lymphs Abs: 2.7 10*3/uL (ref 0.7–4.0)
MCH: 31 pg (ref 26.0–34.0)
MCHC: 34.2 g/dL (ref 30.0–36.0)
MCV: 90.6 fL (ref 78.0–100.0)
MONO ABS: 1.3 10*3/uL (ref 0.1–1.0)
Monocytes Relative: 10 %
NEUTROS ABS: 9.2 10*3/uL (ref 1.7–7.7)
NEUTROS PCT: 67 %
Platelets: 555 10*3/uL — ABNORMAL HIGH (ref 150–400)
RBC: 2.45 MIL/uL — AB (ref 3.87–5.11)
RDW: 21.6 % — ABNORMAL HIGH (ref 11.5–15.5)
WBC: 13.7 10*3/uL — ABNORMAL HIGH (ref 4.0–10.5)

## 2017-12-21 LAB — COMPREHENSIVE METABOLIC PANEL
ALBUMIN: 3.9 g/dL (ref 3.5–5.0)
ALT: 16 U/L (ref 0–44)
ANION GAP: 7 (ref 5–15)
AST: 19 U/L (ref 15–41)
Alkaline Phosphatase: 77 U/L (ref 38–126)
BUN: 7 mg/dL (ref 6–20)
CO2: 27 mmol/L (ref 22–32)
Calcium: 8.9 mg/dL (ref 8.9–10.3)
Chloride: 107 mmol/L (ref 98–111)
Creatinine, Ser: 0.67 mg/dL (ref 0.44–1.00)
GFR calc non Af Amer: >60 mL/min (ref >60)
GLUCOSE: 99 mg/dL (ref 70–99)
POTASSIUM: 3.8 mmol/L (ref 3.5–5.1)
SODIUM: 141 mmol/L (ref 135–145)
Total Bilirubin: 2 mg/dL — ABNORMAL HIGH (ref 0.3–1.2)
Total Protein: 7 g/dL (ref 6.5–8.1)

## 2017-12-21 LAB — I-STAT BETA HCG BLOOD, ED (MC, WL, AP ONLY)

## 2017-12-21 LAB — RETICULOCYTES: RBC.: 2.45 MIL/uL — AB (ref 3.87–5.11)

## 2017-12-21 MED ORDER — HYDROMORPHONE HCL 2 MG/ML IJ SOLN: 2.0000 mg | INTRAMUSCULAR | Status: AC

## 2017-12-21 MED ORDER — DIPHENHYDRAMINE HCL 50 MG/ML IJ SOLN
25.0000 mg | Freq: Once | INTRAMUSCULAR | Status: AC
  Administered 2017-12-21: 25 mg via INTRAVENOUS
  Filled 2017-12-21: qty 1

## 2017-12-21 MED ORDER — KETOROLAC TROMETHAMINE 30 MG/ML IJ SOLN
30.0000 mg | INTRAMUSCULAR | Status: AC
  Administered 2017-12-21: 30 mg via INTRAVENOUS
  Filled 2017-12-21: qty 1

## 2017-12-21 MED ORDER — HYDROMORPHONE HCL 2 MG/ML IJ SOLN
2.0000 mg | INTRAMUSCULAR | Status: AC
  Administered 2017-12-21: 2 mg via INTRAVENOUS
  Filled 2017-12-21: qty 1

## 2017-12-21 MED ORDER — ONDANSETRON HCL 4 MG/2ML IJ SOLN
4.0000 mg | INTRAMUSCULAR | Status: DC | PRN
  Administered 2017-12-21: 4 mg via INTRAVENOUS
  Filled 2017-12-21: qty 2

## 2017-12-21 MED ORDER — SODIUM CHLORIDE 0.45 % IV SOLN
INTRAVENOUS | Status: DC
Start: 2017-12-21 — End: 2017-12-22
  Administered 2017-12-21: 22:00:00 via INTRAVENOUS

## 2017-12-21 NOTE — ED Provider Notes (Signed)
[facility] DEPT Provider Note   CSN: [id number] Arrival date & time: 12/21/17  2057     History   Chief Complaint Chief Complaint  Patient presents with  . Sickle Cell Pain Crisis    HPI [name] is a 27 y.o. female.  HPI The patient presents to the emergency room for acute pain in her left leg that she attributes to a sickle cell pain crisis.  Patient has a history of sickle cell disease.  She states in the past year she has been doing better and has not had as many acute attacks requiring emergency room evaluation.  She started having symptoms yesterday.  She had a deep ache in the left tibia.  Patient tried to take her home medications and even tried exercising slightly to see if it would work the pain out.  Unfortunately the symptoms have increased throughout the day.  It is very painful her to stand.  She denies any fevers she has not noticed any swelling.  She denies any falls or injuries.  She denies other symptoms such as chest pain or shortness of breath. Past Medical History:  Diagnosis Date  . Blood transfusion    "lots"  . Blood transfusion without reported diagnosis   . Chronic back pain    "very severe; have knot in my back; from tight muscle; take RX and exercise for it"  . Depression 01/06/2011  . Exertional dyspnea    "sometimes"  . Genital HSV   . GERD (gastroesophageal reflux disease) 02/17/2011  . Migraines 11/08/11   "@ least twice/month"  . 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  . Mood swings 11/08/11   "I go back and forth; real bad"  . Sickle cell anemia ([name])   . Sickle cell anemia with crisis ([place])   . Thrombocytosis ([place]) 11/09/2015  . Trichotillomania    h/o    Patient Active Problem List   Diagnosis Date Noted  . 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 Chest pain 09/29/2017  . Sickle cell crisis ([place]) 09/29/2017  . Leucocytosis   . 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 Chronic  anticoagulation   . Sickle cell anemia ([place]) 05/19/2016  . History of DVT (deep vein thrombosis) 04/17/2016  . Thrombocytosis ([place]) 11/09/2015  . Hyperbilirubinemia 11/09/2015  . Substance abuse ([place]) 09/26/2015  . Chronic pain 08/04/2015  . Herpes simplex 07/14/2015  . Anemia of chronic disease   . Sickle cell pain crisis ([place]) 03/29/2012  . 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 CHOLECYSTECTOMY  05/2010  . DILATION AND CURETTAGE OF UTERUS  02/20/11   S/P miscarriage  . IR GENERIC HISTORICAL  12/23/2015   IR FLUORO GUIDE CV LINE RIGHT 12/23/2015 Jacqulynn Cadet, MD [facility]  . IR GENERIC HISTORICAL  12/23/2015   IR US GUIDE VASC ACCESS RIGHT 12/23/2015 Jacqulynn Cadet, MD [facility]     OB History    Gravida  5   Para  2   Term  2   Preterm  0   AB  3   Living  2     SAB  3   TAB  0   Ectopic  0   Multiple  0   Live Births  2        Obstetric Comments  Miscarried in October 2012 at about 7  weeks         Home Medications    Prior to Admission medications   Medication Sig Start Date End Date Taking? Authorizing Provider  aspirin-acetaminophen-caffeine (EXCEDRIN MIGRAINE) [phone] MG tablet Take 2 tablets every 6 (six) hours as needed by mouth for headache.   Yes [provider]  folic acid (FOLVITE) 1 MG tablet Take 1 mg by mouth daily.   Yes [provider]  hydroxyurea (HYDREA) 500 MG capsule Take 2 tabs (1000 mg ) PO QOD starting on 06/02/2017 alternating with 1 tab (500 mg) PO QOD starting on 06/03/2017. May take with food to minimize GI side effects. Patient taking differently: Take 1,500 mg by mouth daily. May take with food to minimize GI side effects. 07/24/17  Yes Scot Jun, FNP  morphine (MS CONTIN) 30 MG 12 hr tablet Take 1 tablet (30 mg total) by mouth every 12 (twelve) hours.  11/23/17  Yes Dorena Dew, FNP  Oxycodone HCl 20 MG TABS Take 1 tablet (20 mg total) by mouth every 6 (six) hours as needed (pain). 11/23/17  Yes Dorena Dew, FNP  cyclobenzaprine (FLEXERIL) 10 MG tablet Take 1 tablet (10 mg total) by mouth 3 (three) times daily as needed for muscle spasms. Patient not taking: Reported on 09/29/2017 07/24/17   Scot Jun, FNP  gabapentin (NEURONTIN) 300 MG capsule Take 1 capsule (300 mg total) by mouth 3 (three) times daily. Patient not taking: Reported on 10/18/2017 07/24/17   Scot Jun, FNP    Family History Family History  Problem Relation Age of Onset  . Sickle cell trait Mother   . Sickle cell trait Father   . Diabetes Maternal Grandmother   . Diabetes Paternal Grandmother   . Hypertension Paternal Grandmother   . Diabetes Maternal Grandfather     Social History Social History   Tobacco Use  . Smoking status: Current Some Day Smoker    Packs/day: 0.25    Years: 1.00    Pack years: 0.25    Types: Cigarettes    Last attempt to quit: 03/25/2013    Years since quitting: 4.7  . Smokeless tobacco: Never Used  Substance Use Topics  . Alcohol use: No    Comment: denies  . Drug use: No    Types: Other-see comments    Comment: "Clean for 3 months"     Allergies   Carrot [daucus carota] and Latex   Review of Systems Review of Systems  All other systems reviewed and are negative.    Physical Exam Updated Vital Signs BP (!) 142/57 (BP Location: Left Arm)   Pulse 78   Temp 99.9 F (37.7 C) (Oral)   Resp 14   Ht 1.549 m (5\' 1" )   Wt 92.5 kg   SpO2 98%   BMI 38.55 kg/m   Physical Exam  Constitutional: She appears well-developed and well-nourished. No distress.  HENT:  Head: Normocephalic and atraumatic.  Right Ear: External ear normal.  Left Ear: External ear normal.  Eyes: Conjunctivae are normal. Right eye exhibits no discharge. Left eye exhibits no discharge. No scleral icterus.  Neck: Neck supple.  No tracheal deviation present.  Cardiovascular: Normal rate, regular rhythm and intact distal pulses.  Pulmonary/Chest: Effort normal and breath sounds normal. No stridor. No respiratory distress. She has no wheezes. She has no rales.  Abdominal: Soft. Bowel sounds are normal. She exhibits no distension. There is no tenderness. There is no rebound and no guarding.  Musculoskeletal: She  exhibits tenderness. She exhibits no edema.  Tenderness palpation left tibia, no erythema or edema  Neurological: She is alert. She has normal strength. No cranial nerve deficit (no facial droop, extraocular movements intact, no slurred speech) or sensory deficit. She exhibits normal muscle tone. She displays no seizure activity. Coordination normal.  Skin: Skin is warm and dry. No rash noted.  Psychiatric: She has a normal mood and affect.  Nursing note and vitals reviewed.    ED Treatments / Results  Labs (all labs ordered are listed, but only abnormal results are displayed) Labs Reviewed  COMPREHENSIVE METABOLIC PANEL - Abnormal; Notable for the following components:      Result Value   Total Bilirubin 2.0 (*)    All other components within normal limits  CBC WITH DIFFERENTIAL/PLATELET - Abnormal; Notable for the following components:   WBC 13.7 (*)    RBC 2.45 (*)    Hemoglobin 7.6 (*)    HCT 22.2 (*)    RDW 21.6 (*)    Platelets 555 (*)    All other components within normal limits  RETICULOCYTES - Abnormal; Notable for the following components:   Retic Ct Pct >23.0 (*)    RBC. 2.45 (*)    All other components within normal limits  I-STAT BETA HCG BLOOD, ED ([facility], [facility], [facility] ONLY)     Procedures Procedures  No critical care  Medications Ordered in ED Medications  0.45 % sodium chloride infusion ( Intravenous New Bag/Given 12/21/17 2227)  HYDROmorphone (DILAUDID) injection 2 mg (has no administration in time range)    Or  HYDROmorphone (DILAUDID) injection 2 mg (has no administration in time  range)  ondansetron (ZOFRAN) injection 4 mg (4 mg Intravenous Given 12/21/17 2230)  diphenhydrAMINE (BENADRYL) injection 25 mg (has no administration in time range)  ketorolac (TORADOL) 30 MG/ML injection 30 mg (30 mg Intravenous Given 12/21/17 2230)  HYDROmorphone (DILAUDID) injection 2 mg (2 mg Intravenous Given 12/21/17 2230)    Or  HYDROmorphone (DILAUDID) injection 2 mg ( Subcutaneous See Alternative 12/21/17 2230)  HYDROmorphone (DILAUDID) injection 2 mg (2 mg Intravenous Given 12/21/17 2312)    Or  HYDROmorphone (DILAUDID) injection 2 mg ( Subcutaneous See Alternative 12/21/17 2312)  diphenhydrAMINE (BENADRYL) injection 25 mg (25 mg Intravenous Given 12/21/17 2230)     Initial Impression / Assessment and Plan / ED Course  I have reviewed the triage vital signs and the nursing notes.  Pertinent labs & imaging results that were available during my care of the patient were reviewed by me and considered in my medical decision making (see chart for details).  Clinical Course as of Dec 22 2346  Thu Dec 21, 2017  2346 Patient continues to have severe pain.  She feels like she needs to be admitted   [JK]    Clinical Course User Index [JK] Dorie Rank, MD    Patient presents to the emergency room for evaluation of a sickle cell pain crisis.  Patient was treated with IV pain medications.  Unfortunately she continues to have significant pain.  I will consult the medical service for admission and further treatment.  Final Clinical Impressions(s) / ED Diagnoses   Final diagnoses:  Sickle cell pain crisis [facility])       Dorie Rank, MD 12/21/17 2349

## 2017-12-21 NOTE — ED Triage Notes (Signed)
Pt from home with c/o sickle cell pain in left leg that began yesterday. Pt denies CP and denies SOB. Pt states this is typical for her SS pain

## 2017-12-22 ENCOUNTER — Encounter (HOSPITAL_COMMUNITY): Payer: Self-pay | Admitting: Emergency Medicine

## 2017-12-22 ENCOUNTER — Encounter (HOSPITAL_COMMUNITY): Payer: Self-pay | Admitting: Internal Medicine

## 2017-12-22 ENCOUNTER — Other Ambulatory Visit: Payer: Self-pay

## 2017-12-22 DIAGNOSIS — Z5321 Procedure and treatment not carried out due to patient leaving prior to being seen by health care provider: Secondary | ICD-10-CM | POA: Diagnosis not present

## 2017-12-22 DIAGNOSIS — F17210 Nicotine dependence, cigarettes, uncomplicated: Secondary | ICD-10-CM | POA: Insufficient documentation

## 2017-12-22 DIAGNOSIS — G894 Chronic pain syndrome: Secondary | ICD-10-CM

## 2017-12-22 DIAGNOSIS — Z8759 Personal history of other complications of pregnancy, childbirth and the puerperium: Secondary | ICD-10-CM | POA: Diagnosis not present

## 2017-12-22 DIAGNOSIS — Z79899 Other long term (current) drug therapy: Secondary | ICD-10-CM | POA: Diagnosis not present

## 2017-12-22 DIAGNOSIS — D638 Anemia in other chronic diseases classified elsewhere: Secondary | ICD-10-CM

## 2017-12-22 DIAGNOSIS — D473 Essential (hemorrhagic) thrombocythemia: Secondary | ICD-10-CM | POA: Diagnosis present

## 2017-12-22 DIAGNOSIS — F1410 Cocaine abuse, uncomplicated: Secondary | ICD-10-CM | POA: Diagnosis present

## 2017-12-22 DIAGNOSIS — Z8249 Family history of ischemic heart disease and other diseases of the circulatory system: Secondary | ICD-10-CM | POA: Diagnosis not present

## 2017-12-22 DIAGNOSIS — Z832 Family history of diseases of the blood and blood-forming organs and certain disorders involving the immune mechanism: Secondary | ICD-10-CM | POA: Diagnosis not present

## 2017-12-22 DIAGNOSIS — D5700 Hb-SS disease with crisis, unspecified: Principal | ICD-10-CM | POA: Insufficient documentation

## 2017-12-22 DIAGNOSIS — Z91018 Allergy to other foods: Secondary | ICD-10-CM | POA: Diagnosis not present

## 2017-12-22 DIAGNOSIS — M79605 Pain in left leg: Secondary | ICD-10-CM | POA: Diagnosis present

## 2017-12-22 DIAGNOSIS — Z91040 Latex allergy status: Secondary | ICD-10-CM | POA: Diagnosis not present

## 2017-12-22 DIAGNOSIS — Z86718 Personal history of other venous thrombosis and embolism: Secondary | ICD-10-CM | POA: Insufficient documentation

## 2017-12-22 DIAGNOSIS — Z833 Family history of diabetes mellitus: Secondary | ICD-10-CM | POA: Diagnosis not present

## 2017-12-22 DIAGNOSIS — F1910 Other psychoactive substance abuse, uncomplicated: Secondary | ICD-10-CM | POA: Insufficient documentation

## 2017-12-22 DIAGNOSIS — G8929 Other chronic pain: Secondary | ICD-10-CM | POA: Insufficient documentation

## 2017-12-22 DIAGNOSIS — Z9049 Acquired absence of other specified parts of digestive tract: Secondary | ICD-10-CM | POA: Diagnosis not present

## 2017-12-22 LAB — RAPID URINE DRUG SCREEN, HOSP PERFORMED
AMPHETAMINES: NOT DETECTED
Barbiturates: NOT DETECTED
Benzodiazepines: NOT DETECTED
Cocaine: POSITIVE — AB
OPIATES: POSITIVE — AB
Tetrahydrocannabinol: NOT DETECTED

## 2017-12-22 MED ORDER — SODIUM CHLORIDE 0.9% FLUSH: 9.0000 mL | INTRAVENOUS | Status: DC | PRN

## 2017-12-22 MED ORDER — DIPHENHYDRAMINE HCL 25 MG PO CAPS: 25.0000 mg | ORAL_CAPSULE | ORAL | Status: DC | PRN

## 2017-12-22 MED ORDER — NALOXONE HCL 0.4 MG/ML IJ SOLN: 0.4000 mg | INTRAMUSCULAR | Status: DC | PRN

## 2017-12-22 MED ORDER — MORPHINE SULFATE 2 MG/ML IV SOLN
INTRAVENOUS | Status: DC
  Administered 2017-12-22: 4 mg via INTRAVENOUS
  Administered 2017-12-22: 03:00:00 via INTRAVENOUS
  Filled 2017-12-22: qty 30
  Filled 2017-12-22: qty 25

## 2017-12-22 MED ORDER — SODIUM CHLORIDE 0.9 % IV BOLUS
1000.0000 mL | Freq: Once | INTRAVENOUS | Status: AC
  Administered 2017-12-22: 1000 mL via INTRAVENOUS

## 2017-12-22 MED ORDER — KETOROLAC TROMETHAMINE 30 MG/ML IJ SOLN
30.0000 mg | Freq: Four times a day (QID) | INTRAMUSCULAR | Status: DC
  Administered 2017-12-22 (×3): 30 mg via INTRAVENOUS
  Filled 2017-12-22 (×3): qty 1

## 2017-12-22 MED ORDER — SENNOSIDES-DOCUSATE SODIUM 8.6-50 MG PO TABS
1.0000 | ORAL_TABLET | Freq: Two times a day (BID) | ORAL | Status: DC
  Administered 2017-12-22: 1 via ORAL
  Filled 2017-12-22: qty 1

## 2017-12-22 MED ORDER — HYDROXYUREA 500 MG PO CAPS
1500.0000 mg | ORAL_CAPSULE | Freq: Every day | ORAL | Status: DC
  Administered 2017-12-22: 1500 mg via ORAL
  Filled 2017-12-22: qty 3

## 2017-12-22 MED ORDER — OXYCODONE HCL 20 MG PO TABS: 20.0000 mg | ORAL_TABLET | Freq: Four times a day (QID) | ORAL | Status: DC | PRN

## 2017-12-22 MED ORDER — HYDROMORPHONE 1 MG/ML IV SOLN
INTRAVENOUS | Status: DC
  Administered 2017-12-22: 10:00:00 via INTRAVENOUS
  Filled 2017-12-22: qty 25

## 2017-12-22 MED ORDER — ONDANSETRON HCL 4 MG/2ML IJ SOLN: 4.0000 mg | INTRAMUSCULAR | Status: DC | PRN

## 2017-12-22 MED ORDER — ALUM & MAG HYDROXIDE-SIMETH 200-200-20 MG/5ML PO SUSP: 15.0000 mL | ORAL | Status: DC | PRN

## 2017-12-22 MED ORDER — OXYCODONE HCL 5 MG PO TABS
20.0000 mg | ORAL_TABLET | Freq: Four times a day (QID) | ORAL | Status: DC | PRN
  Administered 2017-12-22: 20 mg via ORAL
  Filled 2017-12-22: qty 4

## 2017-12-22 MED ORDER — ENOXAPARIN SODIUM 40 MG/0.4ML ~~LOC~~ SOLN
40.0000 mg | Freq: Every day | SUBCUTANEOUS | Status: DC
  Administered 2017-12-22: 40 mg via SUBCUTANEOUS
  Filled 2017-12-22: qty 0.4

## 2017-12-22 MED ORDER — MORPHINE SULFATE ER 30 MG PO TBCR
30.0000 mg | EXTENDED_RELEASE_TABLET | Freq: Two times a day (BID) | ORAL | Status: DC
  Administered 2017-12-22: 30 mg via ORAL
  Filled 2017-12-22: qty 1

## 2017-12-22 MED ORDER — ONDANSETRON HCL 4 MG PO TABS: 4.0000 mg | ORAL_TABLET | ORAL | Status: DC | PRN

## 2017-12-22 MED ORDER — POLYETHYLENE GLYCOL 3350 17 G PO PACK: 17.0000 g | PACK | Freq: Every day | ORAL | Status: DC | PRN

## 2017-12-22 MED ORDER — DEXTROSE-NACL 5-0.45 % IV SOLN
INTRAVENOUS | Status: DC
  Administered 2017-12-22 (×3): via INTRAVENOUS

## 2017-12-22 MED ORDER — SODIUM CHLORIDE 0.9 % IV SOLN
25.0000 mg | INTRAVENOUS | Status: DC | PRN
  Filled 2017-12-22: qty 0.5

## 2017-12-22 MED ORDER — HYDROXYZINE HCL 25 MG PO TABS: 25.0000 mg | ORAL_TABLET | ORAL | Status: DC | PRN

## 2017-12-22 MED ORDER — FOLIC ACID 1 MG PO TABS
1.0000 mg | ORAL_TABLET | Freq: Every day | ORAL | Status: DC
  Administered 2017-12-22: 1 mg via ORAL
  Filled 2017-12-22: qty 1

## 2017-12-22 MED ORDER — ONDANSETRON HCL 4 MG/2ML IJ SOLN: 4.0000 mg | Freq: Four times a day (QID) | INTRAMUSCULAR | Status: DC | PRN

## 2017-12-22 NOTE — ED Triage Notes (Signed)
Pt presents with pain in her left leg. Denies CP and SOB. Patient states that this is where her pain normally presents.

## 2017-12-22 NOTE — Progress Notes (Signed)
Patient requested to speak to LCSW regarding resources for her children.  LCSW attempted to meet with patient. Patient was sleeping. LCSW awoke patient. Patient asked that LCSW come back later.   [name] [place]

## 2017-12-22 NOTE — Progress Notes (Signed)
Pt has signed [name] paperwork and is getting her port de-accessed. She understands she is leaving against medical advice and states she cannot continue being in pain and not receiving PCA dilaudid.

## 2017-12-22 NOTE — ED Notes (Signed)
ED TO INPATIENT HANDOFF REPORT  Name/Age/Gender [name] 27 y.o. female  Code Status    Code Status Orders  (From admission, onward)         Start     Ordered   12/22/17 0105  Full code  Continuous     12/22/17 0108        Code Status History    Date Active Date Inactive Code Status Order ID Comments User Context   11/19/2017 2053 11/23/2017 1241 Full Code [id number]  Elwyn Reach, MD Inpatient   10/06/2017 0002 10/09/2017 1803 Full Code [id number]  Rise Patience, MD ED   09/30/2017 0122 10/03/2017 2319 Full Code [id number]  Toy Baker, MD Inpatient   07/10/2017 1947 07/13/2017 1916 Full Code [id number]  Elwyn Reach, MD ED   07/02/2017 0848 07/07/2017 2044 Full Code [id number]  Elwyn Reach, MD Inpatient   05/27/2017 2139 06/01/2017 1937 Full Code [id number]  Etta Quill, DO ED   05/04/2017 2105 05/07/2017 2127 Full Code [id number]  Rise Patience, MD ED   04/01/2017 1813 04/03/2017 1841 Full Code [id number]  Aline August, MD ED   02/28/2017 0045 03/05/2017 2133 Full Code [id number]  Etta Quill, DO ED   02/02/2017 0309 02/04/2017 2250 Full Code [id number]  Bethena Roys, MD Inpatient   12/10/2016 0403 12/14/2016 1705 Full Code [id number]  Etta Quill, DO ED   11/06/2016 2039 11/11/2016 1659 Full Code [id number]  Patrecia Pour, MD Inpatient   10/06/2016 1647 10/09/2016 1809 Full Code [id number]  [name], MD ED   10/02/2016 1512 10/05/2016 1042 Full Code [id number]  Tresa Garter, MD Inpatient   09/15/2016 1723 09/19/2016 2025 Full Code [id number]  Dorena Dew, [place] Inpatient   08/20/2016 1812 08/30/2016 1613 Full Code [id number]  [name], MD Inpatient   07/25/2016 2320 07/29/2016 2126 Full Code [id number]  Norval Morton, MD ED   06/26/2016 2154 07/02/2016 1954 Full Code [id number]  Etta Quill, DO ED   06/10/2016 0511 06/15/2016 1511 Full Code [id number]  Etta Quill, DO ED   05/29/2016 2007 05/31/2016 1803  Full Code [id number]  Cristal Ford, DO Inpatient   05/19/2016 1914 05/25/2016 2005 Full Code [id number]  Charlynne Cousins, MD Inpatient   04/24/2016 1059 05/02/2016 1750 Full Code [id number]  Elwyn Reach, MD Inpatient   04/17/2016 2105 04/20/2016 1913 Full Code [id number]  Reubin Milan, MD Inpatient   04/17/2016 2105 04/17/2016 2105 Full Code [id number]  Reubin Milan, MD Inpatient   03/31/2016 1638 04/07/2016 1808 Full Code [id number]  [name], MD Inpatient   02/26/2016 1859 03/03/2016 2104 Full Code [id number]  [name], MD Inpatient   02/16/2016 1033 02/17/2016 1101 Full Code [id number]  Tresa Garter, MD Inpatient   01/30/2016 0751 02/05/2016 1904 Full Code [id number]  Ivor Costa, MD ED   01/05/2016 0837 01/09/2016 1727 Full Code [id number]  Tresa Garter, MD Inpatient   12/27/2015 0358 12/29/2015 2009 Full Code [id number]  Ivor Costa, MD ED   11/09/2015 0118 11/12/2015 1837 Full Code [id number]  Norval Morton, MD ED   10/31/2015 1854 11/02/2015 1456 Full Code [id number]  Manya Silvas, [place] Inpatient   10/30/2015 1446 10/31/2015 1854 Full Code [id number]  Lavonia Drafts, MD Inpatient   10/26/2015 2125 10/30/2015 1446 Full Code [id number]  Gwynne Edinger, MD Inpatient   10/15/2015 1857 10/18/2015 2027 Full Code [id number]  [name], MD Inpatient  10/06/2015 2258 10/10/2015 1623 Full Code [id number]  Carter, Nikki, MD ED   09/22/2015 2026 09/27/2015 1735 Full Code [id number]  Anyanwu, Ugonna A, MD Inpatient   09/07/2015 1431 09/14/2015 1454 Full Code [id number]  Matthews, Michelle A, MD Inpatient   08/10/2015 1405 08/17/2015 1638 Full Code [id number]  Matthews, Michelle A, MD Inpatient   07/24/2015 0531 08/02/2015 1817 Full Code [id number]  Gambino, Christina M, MD Inpatient   07/24/2015 0447 07/24/2015 0531 Full Code [id number]  Gambino, Christina M, MD Inpatient   07/24/2015 0353 07/24/2015 0447 Full Code [id number]  Gambino, Christina M, MD Inpatient    07/12/2015 0214 07/14/2015 2028 Full Code [id number]  Niu, Xilin, MD ED   06/28/2015 1037 07/03/2015 1441 Full Code [id number]  Matthews, Michelle A, MD Inpatient   05/31/2015 1459 06/05/2015 2049 Full Code [id number]  Matthews, Michelle A, MD ED   05/02/2015 1639 05/08/2015 1644 Full Code [id number]  Matthews, Michelle A, MD Inpatient   04/14/2015 1741 04/22/2015 2006 Full Code [id number]  Jegede, Olugbemiga E, MD Inpatient   04/14/2015 1741 04/14/2015 1741 Full Code [id number]  Jegede, Olugbemiga E, MD Inpatient   04/14/2015 1153 04/14/2015 1741 Full Code [id number]  Jegede, Olugbemiga E, MD Inpatient   03/30/2015 1104 04/03/2015 1742 Full Code [id number]  Jegede, Olugbemiga E, MD Inpatient   03/22/2015 2042 03/26/2015 1747 Full Code [id number]  Ortiz, David Manuel, MD ED   01/25/2015 1323 01/27/2015 1554 Full Code [id number]  Garba, Mohammad L, MD Inpatient   12/07/2014 1557 12/10/2014 1720 Full Code [id number]  Lawson, Marie D, CNM Inpatient   12/05/2014 0920 12/07/2014 1557 Full Code [id number]  Koran, Heather L, RN Inpatient   11/26/2014 2216 12/01/2014 2033 Full Code [id number]  Bacigalupo, Angela M, MD Inpatient   11/19/2014 0129 11/21/2014 1812 Full Code [id number]  Williams, Marie L, CNM Inpatient   10/23/2014 2118 10/30/2014 1932 Full Code [id number]  Stinson, Jacob J, DO Inpatient   10/11/2014 0010 10/14/2014 1707 Full Code [id number]  Clemmons, Lori A, CNM Inpatient   09/18/2014 1545 09/26/2014 1656 Full Code [id number]  [name], Carolyn, MD Inpatient   09/08/2014 0117 09/10/2014 1821 Full Code [id number]  [name], Carolyn, MD Inpatient   09/08/2014 0117 09/08/2014 0117 Full Code [id number]  [name], Carolyn, MD Inpatient   08/09/2014 2137 08/15/2014 2048 Full Code [id number]  Hogan, Heather D, CNM Inpatient   07/14/2014 0301 07/16/2014 1653 Full Code [id number]  Dodson, Jennifer Michelle, RN Inpatient   06/25/2014 1651 06/28/2014 1705 Full Code [id number]  Matthews, Michelle A, MD Inpatient   06/02/2014 0106 06/05/2014  1936 Full Code [id number]  McKeag, Ian D, MD Inpatient   05/03/2014 1818 05/06/2014 2158 Full Code [id number]  Henn, Adam, MD Inpatient   05/02/2014 1207 05/03/2014 1818 Full Code [id number]  Matthews, Michelle A, MD ED   04/10/2014 1641 04/18/2014 2117 Full Code [id number]  Shick, Michael, MD Inpatient   04/09/2014 1028 04/10/2014 1641 Full Code [id number]  Myers, Iskra M, MD Inpatient   02/16/2014 2020 02/25/2014 1411 Full Code [id number]  Gherghe, Costin M, MD ED   01/29/2014 0343 02/01/2014 1442 Full Code [id number]  Kakrakandy, Arshad N, MD Inpatient   01/13/2014 1815 01/16/2014 1304 Full Code [id number]  Devine, Alma M, MD Inpatient   11/25/2013 1437 11/30/2013 2009 Full Code [id number]  Elmahi, Mutaz, MD Inpatient   09/24/2013 0615 09/27/2013 1532 Full Code [id number]  Kakrakandy, Arshad N, MD Inpatient   07/31/2013 0312 08/04/2013 2047 Full Code [id number]  Gardner, Jared M, DO ED   07/16/2013 0907 07/16/2013 2310 Full Code   [id number]  Matthews, Michelle A, MD Inpatient   05/28/2013 0515 06/01/2013 2117 Full Code [id number]  Patel, Pranav, MD ED   03/23/2013 0430 03/29/2013 1937 Full Code [id number]  Jenkins, Harvette C, MD ED   02/11/2013 0803 02/14/2013 1639 Full Code [id number]  Kakrakandy, Arshad N, MD Inpatient   01/27/2013 1257 01/30/2013 1853 Full Code [id number]  Joseph, Preetha, MD Inpatient   10/05/2012 2154 10/12/2012 2107 Full Code [id number]  Jenkins, Harvette C, MD ED   08/27/2012 1955 08/31/2012 1833 Full Code [id number]  Singh, Prashant K, MD Inpatient   06/22/2012 1436 06/27/2012 1457 Full Code [id number]  Kome, Judith Senze, RN Inpatient   03/28/2012 2122 04/04/2012 1929 Full Code [id number]  Paz, Lisette, PA-C ED   03/24/2012 0432 03/25/2012 1144 Full Code [id number]  Dammen, Peter S, PA ED   03/04/2012 0448 03/15/2012 2258 Full Code [id number]  Abiera, Lourdes B, RN Inpatient   02/13/2012 0647 02/17/2012 0124 Full Code [id number]  Jackson, Wanda Makika, RN Inpatient   12/17/2011 1614 12/22/2011 1716 Full Code [id number]  Myers, Iskra M,  MD Inpatient   12/16/2011 2205 12/17/2011 1614 Full Code [id number]  Linker, Martha K, MD ED   11/30/2011 0309 12/03/2011 1745 Full Code [id number]  Hart, Dallas J, RN ED   11/08/2011 0723 11/14/2011 1823 Full Code [id number]  Moseley, Caroline P, RN ED   09/05/2011 0204 09/05/2011 0732 Full Code [id number]  Schulz, Gail K, NP ED   08/05/2011 0755 08/05/2011 1852 Full Code [id number]  Lee, Shinita Daniels, RN ED   08/05/2011 0028 08/05/2011 0755 Full Code [id number]  Connor, Michael D, MD ED   07/13/2011 1149 07/15/2011 1847 Full Code [id number]  Crawford, Anne, NP ED   06/09/2011 0048 06/09/2011 0959 Full Code [id number]  Connor, Michael D, MD ED   05/31/2011 1828 06/01/2011 1837 Full Code [id number]  Adkerson, Tracey Lynne, RN Inpatient   05/26/2011 0218 05/26/2011 1419 Full Code [id number]  Molpus, John L, MD ED   05/06/2011 0700 05/08/2011 1609 Full Code [id number]  Piloto de La Paz, Dayarmys, MD Inpatient   04/17/2011 1329 04/19/2011 1650 Full Code [id number]  Spiegel, Rachel L, MD ED   03/30/2011 0039 04/03/2011 0025 Full Code [id number]  Campos, Kevin M, MD ED      Home/SNF/Other Home  Chief Complaint sickle cell crisis  Level of Care/Admitting Diagnosis ED Disposition    ED Disposition Condition Comment   Admit  Hospital Area: [facility] [[id number]]  Level of Care: Med-Surg [16]  Diagnosis: Sickle cell anemia with crisis ([name]) [218922]  Admitting Physician: YATES, JENNIFER [[id number]]  Attending Physician: YATES, JENNIFER [[id number]]  Estimated length of stay: 3 - 4 days  Certification:: I certify this patient will need inpatient services for at least 2 midnights  PT Class (Do Not Modify): Inpatient [101]  PT Acc Code (Do Not Modify): Private [1]       Medical History Past Medical History:  Diagnosis Date  . Blood transfusion    "lots"  . Chronic back pain    "very severe; have knot in my back; from tight muscle; take RX and exercise for it"  . Depression 01/06/2011  . Exertional dyspnea    "sometimes"  .  Genital HSV   . GERD (gastroesophageal reflux disease) 02/17/2011  . Migraines 11/08/11   "@ least twice/month"  . 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  . Mood swings 11/08/11   "I go back and forth; real bad"  . Sickle cell anemia ([name])   .   Sickle cell anemia with crisis ([place])   . Thrombocytosis ([place]) 11/09/2015  . Trichotillomania    h/o    Allergies Allergies  Allergen Reactions  . Carrot [Daucus Carota] Hives and Swelling    Allergic to carrots  . Latex Rash    IV Location/Drains/Wounds Patient Lines/Drains/Airways Status   Active Line/Drains/Airways    Name:   Placement date:   Placement time:   Site:   Days:   Implanted Port Right Chest   -    -    Chest             Labs/Imaging Results for orders placed or performed during the hospital encounter of 12/21/17 (from the past 48 hour(s))  Comprehensive metabolic panel     Status: Abnormal   Collection Time: 12/21/17 10:24 PM  Result Value Ref Range   Sodium 141 135 - 145 mmol/L   Potassium 3.8 3.5 - 5.1 mmol/L   Chloride 107 98 - 111 mmol/L   CO2 27 22 - 32 mmol/L   Glucose, Bld 99 70 - 99 mg/dL   BUN 7 6 - 20 mg/dL   Creatinine, Ser 0.67 0.44 - 1.00 mg/dL   Calcium 8.9 8.9 - 10.3 mg/dL   Total Protein 7.0 6.5 - 8.1 g/dL   Albumin 3.9 3.5 - 5.0 g/dL   AST 19 15 - 41 U/L   ALT 16 0 - 44 U/L   Alkaline Phosphatase 77 38 - 126 U/L   Total Bilirubin 2.0 (H) 0.3 - 1.2 mg/dL   GFR calc non Af Amer >60 >60 mL/min   GFR calc Af Amer >60 >60 mL/min    Comment: (NOTE) The eGFR has been calculated using the CKD EPI equation. This calculation has not been validated in all clinical situations. eGFR's persistently <60 mL/min signify possible Chronic Kidney Disease.    Anion gap 7 5 - 15    Comment: Performed at [facility], [place] [address] [place], [place] [address]  CBC with Differential     Status: Abnormal   Collection Time: 12/21/17 10:24 PM  Result Value Ref Range   WBC 13.7 (H)  4.0 - 10.5 K/uL   RBC 2.45 (L) 3.87 - 5.11 MIL/uL   Hemoglobin 7.6 (L) 12.0 - 15.0 g/dL   HCT 22.2 (L) 36.0 - 46.0 %   MCV 90.6 78.0 - 100.0 fL   MCH 31.0 26.0 - 34.0 pg   MCHC 34.2 30.0 - 36.0 g/dL   RDW 21.6 (H) 11.5 - 15.5 %   Platelets 555 (H) 150 - 400 K/uL   Neutrophils Relative % 67 %   Neutro Abs 9.2 1.7 - 7.7 K/uL   Lymphocytes Relative 20 %   Lymphs Abs 2.7 0.7 - 4.0 K/uL   Monocytes Relative 10 %   Monocytes Absolute 1.3 0.1 - 1.0 K/uL   Eosinophils Relative 3 %   Eosinophils Absolute 0.4 0.0 - 0.7 K/uL   Basophils Relative 0 %   Basophils Absolute 0.0 0.0 - 0.1 K/uL   RBC Morphology POLYCHROMASIA PRESENT     Comment: Sickle cells present TARGET CELLS RARE NRBCs Performed at [facility], [place] [address] [place], [place] [address]   Reticulocytes     Status: Abnormal   Collection Time: 12/21/17 10:24 PM  Result Value Ref Range   Retic Ct Pct >23.0 (H) 0.4 - 3.1 %   RBC. 2.45 (L) 3.87 - 5.11 MIL/uL   Retic Count, Absolute NOT CALCULATED 19.0 - 186.0  K/uL    Comment: Performed at [facility], [place] [address], [place], [place] [address]  I-Stat beta hCG blood, ED     Status: None   Collection Time: 12/21/17 10:28 PM  Result Value Ref Range   I-stat hCG, quantitative <5.0 <5 mIU/mL   Comment 3            Comment:   GEST. AGE      CONC.  (mIU/mL)   <=1 WEEK        5 - 50     2 WEEKS       50 - 500     3 WEEKS       100 - 10,000     4 WEEKS     1,000 - 30,000        FEMALE AND NON-PREGNANT FEMALE:     LESS THAN 5 mIU/mL    *Note: Due to a large number of results and/or encounters for the requested time period, some results have not been displayed. A complete set of results can be found in Results Review.   No results found.  Pending Labs Unresulted Labs (From admission, onward)    Start     Ordered   12/22/17 0015  Urine rapid drug screen (hosp performed)  STAT,   R     12/22/17 0014          Vitals/Pain Today's  Vitals   12/21/17 2354 12/22/17 0000 12/22/17 0026 12/22/17 0030  BP: 107/73   (!) 100/40  Pulse: 99 93  80  Resp: 20   (!) 22  Temp:      TempSrc:      SpO2: 95%   (!) 88%  Weight:      Height:      PainSc: 6   6      Isolation Precautions No active isolations  Medications Medications  morphine (MS CONTIN) 12 hr tablet 30 mg (has no administration in time range)  hydroxyurea (HYDREA) capsule 1,500 mg (has no administration in time range)  folic acid (FOLVITE) tablet 1 mg (has no administration in time range)  senna-docusate (Senokot-S) tablet 1 tablet (has no administration in time range)  polyethylene glycol (MIRALAX / GLYCOLAX) packet 17 g (has no administration in time range)  naloxone (NARCAN) injection 0.4 mg (has no administration in time range)    And  sodium chloride flush (NS) 0.9 % injection 9 mL (has no administration in time range)  enoxaparin (LOVENOX) injection 40 mg (has no administration in time range)  dextrose 5 %-0.45 % sodium chloride infusion (has no administration in time range)  ketorolac (TORADOL) 30 MG/ML injection 30 mg (has no administration in time range)  ondansetron (ZOFRAN) tablet 4 mg (has no administration in time range)    Or  ondansetron (ZOFRAN) injection 4 mg (has no administration in time range)  hydrOXYzine (ATARAX/VISTARIL) tablet 25-50 mg (has no administration in time range)  alum & mag hydroxide-simeth (MAALOX/MYLANTA) 200-200-20 MG/5ML suspension 15-30 mL (has no administration in time range)  morphine 2 mg/mL PCA injection (has no administration in time range)  ketorolac (TORADOL) 30 MG/ML injection 30 mg (30 mg Intravenous Given 12/21/17 2230)  HYDROmorphone (DILAUDID) injection 2 mg (2 mg Intravenous Given 12/21/17 2230)    Or  HYDROmorphone (DILAUDID) injection 2 mg ( Subcutaneous See Alternative 12/21/17 2230)  HYDROmorphone (DILAUDID) injection 2 mg (2 mg Intravenous Given 12/21/17 2312)    Or  HYDROmorphone (DILAUDID) injection 2 mg  (  Subcutaneous See Alternative 12/21/17 2312)  HYDROmorphone (DILAUDID) injection 2 mg (2 mg Intravenous Given 12/21/17 2352)    Or  HYDROmorphone (DILAUDID) injection 2 mg ( Subcutaneous See Alternative 12/21/17 2352)  diphenhydrAMINE (BENADRYL) injection 25 mg (25 mg Intravenous Given 12/21/17 2230)  diphenhydrAMINE (BENADRYL) injection 25 mg (25 mg Intravenous Given 12/21/17 2352)    Mobility walks

## 2017-12-22 NOTE — Care Management Note (Signed)
Case Management Note  Patient Details  Name: [name] MRN: [id number] Date of Birth: 1990-11-23  Subjective/Objective:        Sickle cell anemia/iv pca pump-dilaudid. Iv d5w with 1/2 ns at 100cc/hr            Action/Plan:  Will follow for cm needs   Expected Discharge Date:                  Expected Discharge Plan:  Home/Self Care  In-House Referral:     Discharge planning Services  CM Consult  Post Acute Care Choice:    Choice offered to:     DME Arranged:    DME Agency:     HH Arranged:    HH Agency:     Status of Service:  In process, will continue to follow  If discussed at Long Length of Stay Meetings, dates discussed:    Additional Comments:  Leeroy Cha, RN 12/22/2017, 12:24 PM

## 2017-12-22 NOTE — Progress Notes (Signed)
Pt is requesting to leave AMA. She states that her pain is not being handled properly here and she feels as though the MD is being too harsh against her because she was positive for cocaine. RN explained to pt that it is a hospital policy, not just for her. She stated she does not care and she would like to leave. Paged the night MD with update.

## 2017-12-22 NOTE — Progress Notes (Signed)
Pharmacy IV to PO conversion  The patient is ordered Diphenhydramine by the intravenous route with a linked PO option available.  Based on criteria approved by the Pharmacy and [place], the IV option is being discontinued.   Not prescribed to treat or prevent a severe allergic reaction   Not prescribed as premedication prior to receiving blood product, biologic medication, antimicrobial, or chemotherapy agent   The patient has tolerated at least one dose of an oral or enteral medication   The patient has no evidence of active gastrointestinal bleeding or impaired GI absorption (gastrectomy, short bowel, patient on TNA or NPO).   The patient is not undergoing procedural sedation  If you have any questions about this conversion, please contact the Pharmacy Department (ext [phone]).  Thank you.  Reuel Boom, PharmD, BCPS [phone] 12/22/2017, 12:26 PM

## 2017-12-22 NOTE — ED Notes (Signed)
Pt made aware we needed urine

## 2017-12-22 NOTE — H&P (Signed)
History and Physical    [name] MEQ:[id number] DOB: 22-Jan-1991 DOA: 12/21/2017  PCP: Azzie Glatter, FNP Consultants:  None Patient coming from:  Home - lives with children; NOK: Mother, [phone]  Chief Complaint: Leg pain  HPI: [name] is a 27 y.o. female with medical history significant of sickle cell anemia; substance abuse; thrombocytosis; and chronic back pain presenting with leg pain.  She was last admitted 7/7-11.  She is having sickle cell pain in the left leg from the knee to ankle.  Severe pain with standing/walking.  The pain started last night and her home medications were not helping.  She tried drinking water, using heating bad, stretching, walking - but it was getting worse.  She does usually have pain crises in her legs.  ED Course:   Sickle cell pain crisis.  Does not present all that frequently.  Severe leg pain, refractory to home pain meds.  Hgb 7.6, appears to be at baseline.  Review of Systems: As per HPI; otherwise review of systems reviewed and negative.   Ambulatory Status:  Ambulates without assistance  Past Medical History:  Diagnosis Date  . Blood transfusion    "lots"  . Chronic back pain    "very severe; have knot in my back; from tight muscle; take RX and exercise for it"  . Depression 01/06/2011  . Exertional dyspnea    "sometimes"  . Genital HSV   . GERD (gastroesophageal reflux disease) 02/17/2011  . Migraines 11/08/11   "@ least twice/month"  . 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  . Mood swings 11/08/11   "I go back and forth; real bad"  . Sickle cell anemia ([name])   . Sickle cell anemia with crisis ([place])   . Thrombocytosis ([place]) 11/09/2015  . Trichotillomania    h/o    Past Surgical History:  Procedure Laterality Date  . CHOLECYSTECTOMY  05/2010  . DILATION AND CURETTAGE OF UTERUS  02/20/11   S/P miscarriage  . IR GENERIC HISTORICAL  12/23/2015   IR FLUORO GUIDE CV LINE RIGHT 12/23/2015 Jacqulynn Cadet,  MD [facility]  . IR GENERIC HISTORICAL  12/23/2015   IR US GUIDE VASC ACCESS RIGHT 12/23/2015 Jacqulynn Cadet, MD [facility]    Social History   Socioeconomic History  . Marital status: Single    Spouse name: Not on file  . Number of children: Not on file  . Years of education: Not on file  . Highest education level: Not on file  Occupational History  . Occupation: disabled  Social Needs  . Financial resource strain: Not on file  . Food insecurity:    Worry: Not on file    Inability: Not on file  . Transportation needs:    Medical: Not on file    Non-medical: Not on file  Tobacco Use  . Smoking status: Current Some Day Smoker    Packs/day: 0.25    Years: 1.00    Pack years: 0.25    Types: Cigarettes    Last attempt to quit: 03/25/2013    Years since quitting: 4.7  . Smokeless tobacco: Never Used  Substance and Sexual Activity  . Alcohol use: No    Comment: denies  . Drug use: No    Types: Other-see comments    Comment: "Clean for 3 months"  . Sexual activity: Yes    Partners: Male    Birth control/protection: None    Comment: last sex Jul 11 2015  Lifestyle  .  Physical activity:    Days per week: Not on file    Minutes per session: Not on file  . Stress: Not on file  Relationships  . Social connections:    Talks on phone: Not on file    Gets together: Not on file    Attends religious service: Not on file    Active member of club or organization: Not on file    Attends meetings of clubs or organizations: Not on file    Relationship status: Not on file  . Intimate partner violence:    Fear of current or ex partner: Not on file    Emotionally abused: Not on file    Physically abused: Not on file    Forced sexual activity: Not on file  Other Topics Concern  . Not on file  Social History Narrative   Lives  With mother   FOB is supportive-supposed to be moving next month   Is a student at [employer] time    Allergies  Allergen Reactions  . Carrot  [Daucus Carota] Hives and Swelling    Allergic to carrots  . Latex Rash    Family History  Problem Relation Age of Onset  . Sickle cell trait Mother   . Sickle cell trait Father   . Diabetes Maternal Grandmother   . Diabetes Paternal Grandmother   . Hypertension Paternal Grandmother   . Diabetes Maternal Grandfather     Prior to Admission medications   Medication Sig Start Date End Date Taking? Authorizing Provider  aspirin-acetaminophen-caffeine (EXCEDRIN MIGRAINE) [phone] MG tablet Take 2 tablets every 6 (six) hours as needed by mouth for headache.   Yes [provider]  folic acid (FOLVITE) 1 MG tablet Take 1 mg by mouth daily.   Yes [provider]  hydroxyurea (HYDREA) 500 MG capsule Take 2 tabs (1000 mg ) PO QOD starting on 06/02/2017 alternating with 1 tab (500 mg) PO QOD starting on 06/03/2017. May take with food to minimize GI side effects. Patient taking differently: Take 1,500 mg by mouth daily. May take with food to minimize GI side effects. 07/24/17  Yes Scot Jun, FNP  morphine (MS CONTIN) 30 MG 12 hr tablet Take 1 tablet (30 mg total) by mouth every 12 (twelve) hours. 11/23/17  Yes Dorena Dew, FNP  Oxycodone HCl 20 MG TABS Take 1 tablet (20 mg total) by mouth every 6 (six) hours as needed (pain). 11/23/17  Yes Dorena Dew, FNP  cyclobenzaprine (FLEXERIL) 10 MG tablet Take 1 tablet (10 mg total) by mouth 3 (three) times daily as needed for muscle spasms. Patient not taking: Reported on 09/29/2017 07/24/17   Scot Jun, FNP  gabapentin (NEURONTIN) 300 MG capsule Take 1 capsule (300 mg total) by mouth 3 (three) times daily. Patient not taking: Reported on 10/18/2017 07/24/17   Scot Jun, FNP    Physical Exam: Vitals:   12/21/17 2313 12/21/17 2354 12/22/17 0000 12/22/17 0030  BP: (!) 142/57 107/73  (!) 100/40  Pulse: 78 99 93 80  Resp: 14 20  (!) 22  Temp:      TempSrc:      SpO2: 98% 95%  (!) 88%  Weight:        Height:         General:  Appears calm and comfortable and is NAD; wakes up briefly and answers a few questions and then lapses back into deep sleep with snoring Eyes:  PERRL, EOMI, normal lids, iris ENT:  grossly normal hearing, lips & tongue, mmm Neck:  no LAD, masses or thyromegaly; R port-a-cath is in place in upper chest Cardiovascular:  RRR, no m/r/g. No LE edema.  Respiratory:   CTA bilaterally with no wheezes/rales/rhonchi.  Normal respiratory effort. Abdomen:  soft, NT, ND, NABS Skin:  no rash or induration seen on limited exam Musculoskeletal:  grossly normal tone BUE/BLE, good ROM, no bony abnormality; she does have TTP along the left lower leg Lower extremity:  No LE edema.  Limited foot exam with no ulcerations.  2+ distal pulses. Psychiatric:  grossly normal mood and affect, speech fluent and appropriate, AOx3 - alternating with somnolence and snoring Neurologic:  CN 2-12 grossly intact, moves all extremities in coordinated fashion, sensation intact    Radiological Exams on Admission: No results found.  EKG: not done   Labs on Admission: I have personally reviewed the available labs and imaging studies at the time of the admission.  Pertinent labs:   Normal CMP except bili 2.0 WBC 13.7 Hgb 7.6; improved from 7/10 Platelets 555; 481 on 7/10  Assessment/Plan Principal Problem:   Sickle cell anemia with crisis ([name]) Active Problems:   Anemia of chronic disease   Chronic pain   Thrombocytosis ([name])   Sickle cell pain crisis with anemia -Patient with lifelong h/o sickle cell and multiple admissions -Patient does not have signs of infection; elevated WBC count is thought to be an acute stress response. No indication for antibiotics.  -Hemoglobin appears to be at/above usual baseline. - Ad[facility] to [facility] -Start high-dose Dilaudid PCA protocol for pain -continue folic acid and hydroxyurea  -hydroxyzine for itch -IVF: D5-1/2NS at 150 cc/h -Zofran for  nausea -Thrombocytosis is thought to be due to sickle cell  Chronic pain syndrome -Continue MS Contin -Hold prn vicodin    DVT prophylaxis:  Lovenox  Code Status: Full - confirmed with patient Family Communication: None present Disposition Plan:  Home once clinically improved Consults called: None  Admission status: Admit - It is my clinical opinion that admission to INPATIENT is reasonable and necessary because of the expectation that this patient will require hospital care that crosses at least 2 midnights to treat this condition based on the medical complexity of the problems presented.  Given the aforementioned information, the predictability of an adverse outcome is felt to be significant.    Karmen Bongo MD [facility]  If note is complete, please contact covering daytime or nighttime physician. [email or web address] Password [email or web address]  12/22/2017, 1:47 AM

## 2017-12-23 ENCOUNTER — Observation Stay (HOSPITAL_COMMUNITY)
Admission: EM | Admit: 2017-12-23 | Discharge: 2017-12-23 | Disposition: A | Discharge status: Home / Self Care | Payer: Medicare Other | Attending: Internal Medicine | Admitting: Internal Medicine

## 2017-12-23 DIAGNOSIS — D571 Sickle-cell disease without crisis: Secondary | ICD-10-CM | POA: Diagnosis not present

## 2017-12-23 DIAGNOSIS — F1910 Other psychoactive substance abuse, uncomplicated: Secondary | ICD-10-CM | POA: Diagnosis present

## 2017-12-23 DIAGNOSIS — G8929 Other chronic pain: Secondary | ICD-10-CM

## 2017-12-23 DIAGNOSIS — G894 Chronic pain syndrome: Secondary | ICD-10-CM | POA: Diagnosis present

## 2017-12-23 DIAGNOSIS — D5700 Hb-SS disease with crisis, unspecified: Principal | ICD-10-CM

## 2017-12-23 LAB — CBC WITH DIFFERENTIAL/PLATELET
BASOS ABS: 0.1 10*3/uL (ref 0.0–0.1)
BASOS PCT: 0 %
EOS ABS: 0.4 10*3/uL (ref 0.0–0.7)
Eosinophils Relative: 3 %
HCT: 21.5 % — ABNORMAL LOW (ref 36.0–46.0)
HEMOGLOBIN: 7.3 g/dL — AB (ref 12.0–15.0)
Lymphocytes Relative: 17 %
Lymphs Abs: 2.5 10*3/uL (ref 0.7–4.0)
MCH: 31.1 pg (ref 26.0–34.0)
MCHC: 34 g/dL (ref 30.0–36.0)
MCV: 91.5 fL (ref 78.0–100.0)
Monocytes Absolute: 1.2 10*3/uL — ABNORMAL HIGH (ref 0.1–1.0)
Monocytes Relative: 8 %
NEUTROS PCT: 72 %
Neutro Abs: 10.5 10*3/uL — ABNORMAL HIGH (ref 1.7–7.7)
PLATELETS: 563 10*3/uL — AB (ref 150–400)
RBC: 2.35 MIL/uL — AB (ref 3.87–5.11)
RDW: 22.3 % — ABNORMAL HIGH (ref 11.5–15.5)
WBC: 14.7 10*3/uL — AB (ref 4.0–10.5)

## 2017-12-23 LAB — RETICULOCYTES: RBC.: 2.35 MIL/uL — ABNORMAL LOW (ref 3.87–5.11)

## 2017-12-23 LAB — COMPREHENSIVE METABOLIC PANEL
ALK PHOS: 68 U/L (ref 38–126)
ALT: 17 U/L (ref 0–44)
AST: 18 U/L (ref 15–41)
Albumin: 3.7 g/dL (ref 3.5–5.0)
Anion gap: 9 (ref 5–15)
BILIRUBIN TOTAL: 1.8 mg/dL — AB (ref 0.3–1.2)
BUN: 8 mg/dL (ref 6–20)
CALCIUM: 8.7 mg/dL — AB (ref 8.9–10.3)
CO2: 24 mmol/L (ref 22–32)
Chloride: 104 mmol/L (ref 98–111)
Creatinine, Ser: 0.76 mg/dL (ref 0.44–1.00)
Glucose, Bld: 96 mg/dL (ref 70–99)
Potassium: 3.8 mmol/L (ref 3.5–5.1)
SODIUM: 137 mmol/L (ref 135–145)
TOTAL PROTEIN: 6.5 g/dL (ref 6.5–8.1)

## 2017-12-23 MED ORDER — DIPHENHYDRAMINE HCL 50 MG/ML IJ SOLN
25.0000 mg | Freq: Once | INTRAMUSCULAR | Status: AC
  Administered 2017-12-23: 25 mg via INTRAVENOUS
  Filled 2017-12-23: qty 1

## 2017-12-23 MED ORDER — HYDROMORPHONE HCL 2 MG/ML IJ SOLN: 2.0000 mg | INTRAMUSCULAR | Status: AC

## 2017-12-23 MED ORDER — HYDROMORPHONE HCL 2 MG/ML IJ SOLN
2.0000 mg | INTRAMUSCULAR | Status: AC
  Administered 2017-12-23: 2 mg via INTRAVENOUS
  Filled 2017-12-23: qty 1

## 2017-12-23 MED ORDER — OXYCODONE HCL 20 MG PO TABS: 20.0000 mg | ORAL_TABLET | ORAL | 0 refills | Status: AC | PRN

## 2017-12-23 MED ORDER — KETOROLAC TROMETHAMINE 30 MG/ML IJ SOLN
30.0000 mg | INTRAMUSCULAR | Status: AC
  Administered 2017-12-23: 30 mg via INTRAVENOUS
  Filled 2017-12-23: qty 1

## 2017-12-23 MED ORDER — SENNOSIDES-DOCUSATE SODIUM 8.6-50 MG PO TABS
1.0000 | ORAL_TABLET | Freq: Two times a day (BID) | ORAL | Status: DC
  Administered 2017-12-23: 1 via ORAL
  Filled 2017-12-23: qty 1

## 2017-12-23 MED ORDER — KETOROLAC TROMETHAMINE 30 MG/ML IJ SOLN
30.0000 mg | Freq: Four times a day (QID) | INTRAMUSCULAR | Status: DC
  Administered 2017-12-23 (×2): 30 mg via INTRAVENOUS
  Filled 2017-12-23 (×2): qty 1

## 2017-12-23 MED ORDER — OXYCODONE HCL 5 MG PO TABS
20.0000 mg | ORAL_TABLET | Freq: Four times a day (QID) | ORAL | Status: DC | PRN
  Administered 2017-12-23: 20 mg via ORAL
  Filled 2017-12-23: qty 4

## 2017-12-23 MED ORDER — MORPHINE SULFATE ER 30 MG PO TBCR: 30.0000 mg | EXTENDED_RELEASE_TABLET | Freq: Two times a day (BID) | ORAL | 0 refills | Status: AC

## 2017-12-23 MED ORDER — HYDROXYUREA 500 MG PO CAPS
1500.0000 mg | ORAL_CAPSULE | Freq: Every day | ORAL | Status: DC
  Administered 2017-12-23: 1500 mg via ORAL
  Filled 2017-12-23: qty 3

## 2017-12-23 MED ORDER — MORPHINE SULFATE ER 30 MG PO TBCR
30.0000 mg | EXTENDED_RELEASE_TABLET | Freq: Two times a day (BID) | ORAL | Status: DC
  Administered 2017-12-23: 30 mg via ORAL
  Filled 2017-12-23: qty 1

## 2017-12-23 MED ORDER — POLYETHYLENE GLYCOL 3350 17 G PO PACK: 17.0000 g | PACK | Freq: Every day | ORAL | Status: DC | PRN

## 2017-12-23 MED ORDER — ENOXAPARIN SODIUM 40 MG/0.4ML ~~LOC~~ SOLN: 40.0000 mg | SUBCUTANEOUS | Status: DC

## 2017-12-23 MED ORDER — ONDANSETRON HCL 4 MG/2ML IJ SOLN
4.0000 mg | INTRAMUSCULAR | Status: DC | PRN
  Administered 2017-12-23: 4 mg via INTRAVENOUS
  Filled 2017-12-23: qty 2

## 2017-12-23 MED ORDER — HEPARIN SOD (PORK) LOCK FLUSH 100 UNIT/ML IV SOLN
500.0000 [IU] | INTRAVENOUS | Status: AC | PRN
  Administered 2017-12-23: 500 [IU]

## 2017-12-23 MED ORDER — DEXTROSE-NACL 5-0.45 % IV SOLN
INTRAVENOUS | Status: DC
  Administered 2017-12-23 (×2): via INTRAVENOUS

## 2017-12-23 NOTE — ED Provider Notes (Signed)
[facility] Provider Note  CSN: [id number] Arrival date & time: 12/22/17 2124  Chief Complaint(s) Sickle Cell Pain Crisis  HPI [name] is a 27 y.o. female with a history of sickle cell disease who presents to the emergency department with left leg pain consistent with her sickle cell crisis.  Patient was was admitted and left AGAINST MEDICAL ADVICE earlier this evening for which she reports due to social reasons stating that she needed to go home and see her kids.  She denies any acute changes to her pain.  Denies any associated fevers or erythema.  Reported swelling which has improved.  Denies any trauma.  Denies any chest pain or shortness of breath.  No abdominal pain.  No nausea or vomiting.  Denies any other physical complaints at this time.  HPI  Past Medical History Past Medical History:  Diagnosis Date  . Blood transfusion    "lots"  . Chronic back pain    "very severe; have knot in my back; from tight muscle; take RX and exercise for it"  . Depression 01/06/2011  . Exertional dyspnea    "sometimes"  . Genital HSV   . GERD (gastroesophageal reflux disease) 02/17/2011  . Migraines 11/08/11   "@ least twice/month"  . 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  . Mood swings 11/08/11   "I go back and forth; real bad"  . Sickle cell anemia ([name])   . Sickle cell anemia with crisis ([place])   . Thrombocytosis ([place]) 11/09/2015  . Trichotillomania    h/o   Patient Active Problem List   Diagnosis Date Noted  . Sickle cell anemia with crisis ([place]) 12/22/2017  . 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 Chest pain 09/29/2017  . Sickle cell crisis ([place]) 09/29/2017  . Leucocytosis   . 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 Chronic anticoagulation   . Sickle cell anemia ([place]) 05/19/2016  . History of DVT (deep vein thrombosis) 04/17/2016  . Thrombocytosis ([place]) 11/09/2015  . Hyperbilirubinemia 11/09/2015  . Substance abuse  ([place]) 09/26/2015  . Chronic pain 08/04/2015  . Herpes simplex 07/14/2015  . Anemia of chronic disease   . Sickle cell pain crisis ([place]) 03/29/2012  . 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 Authorizing Provider  aspirin-acetaminophen-caffeine (EXCEDRIN MIGRAINE) [phone] MG tablet Take 2 tablets every 6 (six) hours as needed by mouth for headache.   Yes [provider]  hydrocortisone cream 1 % Apply 1 application topically 2 (two) times daily as needed for itching.   Yes [provider]  hydroxyurea (HYDREA) 500 MG capsule Take 2 tabs (1000 mg ) PO QOD starting on 06/02/2017 alternating with 1 tab (500 mg) PO QOD starting on 06/03/2017. May take with food to minimize GI side effects. Patient taking differently: Take 1,500 mg by mouth daily. May take with food to minimize GI side effects. 07/24/17  Yes Scot Jun, FNP  Menthol-Methyl Salicylate (MUSCLE RUB EX) Apply 1 application topically 3 (three) times daily as needed (pain).   Yes [provider]  morphine (MS CONTIN) 30 MG 12 hr tablet Take 1 tablet (30 mg total) by mouth every 12 (twelve) hours. 11/23/17  Yes Dorena Dew, FNP  Oxycodone HCl 20 MG TABS Take 1 tablet (20 mg total) by mouth every 6 (six) hours  as needed (pain). 11/23/17  Yes Dorena Dew, FNP                                                                                                                                    Past Surgical History Past Surgical History:  Procedure Laterality Date  . CHOLECYSTECTOMY  05/2010  . DILATION AND CURETTAGE OF UTERUS  02/20/11   S/P miscarriage  . IR GENERIC HISTORICAL  12/23/2015   IR FLUORO GUIDE CV LINE RIGHT 12/23/2015 Jacqulynn Cadet, MD [facility]  . IR GENERIC HISTORICAL  12/23/2015   IR US GUIDE VASC ACCESS RIGHT  12/23/2015 Jacqulynn Cadet, MD [facility]   Family History Family History  Problem Relation Age of Onset  . Sickle cell trait Mother   . Sickle cell trait Father   . Diabetes Maternal Grandmother   . Diabetes Paternal Grandmother   . Hypertension Paternal Grandmother   . Diabetes Maternal Grandfather     Social History Social History   Tobacco Use  . Smoking status: Current Some Day Smoker    Packs/day: 0.25    Years: 1.00    Pack years: 0.25    Types: Cigarettes    Last attempt to quit: 03/25/2013    Years since quitting: 4.7  . Smokeless tobacco: Never Used  Substance Use Topics  . Alcohol use: No    Comment: denies  . Drug use: No    Types: Other-see comments    Comment: "Clean for 3 months"   Allergies Carrot [daucus carota] and Latex  Review of Systems Review of Systems All other systems are reviewed and are negative for acute change except as noted in the HPI  Physical Exam Vital Signs  I have reviewed the triage vital signs BP 140/68 (BP Location: Left Arm)   Pulse 86   Temp 98.6 F (37 C) (Oral)   Resp 18   SpO2 98%   Physical Exam  Constitutional: She is oriented to person, place, and time. She appears well-developed and well-nourished. No distress.  HENT:  Head: Normocephalic and atraumatic.  Right Ear: External ear normal.  Left Ear: External ear normal.  Nose: Nose normal.  Eyes: Conjunctivae and EOM are normal. No scleral icterus.  Neck: Normal range of motion and phonation normal.  Cardiovascular: Normal rate and regular rhythm.  Pulmonary/Chest: Effort normal. No stridor. No respiratory distress.  Abdominal: She exhibits no distension.  Musculoskeletal: Normal range of motion. She exhibits no edema.       Left lower leg: She exhibits bony tenderness. She exhibits no swelling.       Legs: Neurological: She is alert and oriented to person, place, and time.  Skin: She is not diaphoretic.  Psychiatric: She has a normal mood and affect.  Her behavior is normal.  Vitals reviewed.   ED Results and Treatments Labs (all labs ordered are listed, but only abnormal results are displayed) Labs Reviewed  RETICULOCYTES - Abnormal; Notable for the following components:      Result Value   Retic Ct Pct >23.0 (*)    RBC. 2.35 (*)    All other components within normal limits  COMPREHENSIVE METABOLIC PANEL - Abnormal; Notable for the following components:   Calcium 8.7 (*)    Total Bilirubin 1.8 (*)    All other components within normal limits  CBC WITH DIFFERENTIAL/PLATELET - Abnormal; Notable for the following components:   WBC 14.7 (*)    RBC 2.35 (*)    Hemoglobin 7.3 (*)    HCT 21.5 (*)    RDW 22.3 (*)    Platelets 563 (*)    Neutro Abs 10.5 (*)    Monocytes Absolute 1.2 (*)    All other components within normal limits                                                                                                                         EKG  EKG Interpretation  Date/Time:    Ventricular Rate:    PR Interval:    QRS Duration:   QT Interval:    QTC Calculation:   R Axis:     Text Interpretation:        Radiology No results found. Pertinent labs & imaging results that were available during my care of the patient were reviewed by me and considered in my medical decision making (see chart for details).  Medications Ordered in ED Medications  dextrose 5 %-0.45 % sodium chloride infusion ( Intravenous Transfusing/Transfer 12/23/17 0555)  ondansetron (ZOFRAN) injection 4 mg (4 mg Intravenous Given 12/23/17 0151)  oxyCODONE (Oxy IR/ROXICODONE) immediate release tablet 20 mg (has no administration in time range)  morphine (MS CONTIN) 12 hr tablet 30 mg (has no administration in time range)  hydroxyurea (HYDREA) capsule 1,500 mg (has no administration in time range)  senna-docusate (Senokot-S) tablet 1 tablet (has no administration in time range)  polyethylene glycol (MIRALAX / GLYCOLAX) packet 17 g (has no  administration in time range)  enoxaparin (LOVENOX) injection 40 mg (has no administration in time range)  ketorolac (TORADOL) 30 MG/ML injection 30 mg (30 mg Intravenous Given 12/23/17 0655)  ketorolac (TORADOL) 30 MG/ML injection 30 mg (30 mg Intravenous Given 12/23/17 0151)  HYDROmorphone (DILAUDID) injection 2 mg (2 mg Intravenous Given 12/23/17 0151)    Or  HYDROmorphone (DILAUDID) injection 2 mg ( Subcutaneous See Alternative 12/23/17 0151)  HYDROmorphone (DILAUDID) injection 2 mg (2 mg Intravenous Given 12/23/17 0235)    Or  HYDROmorphone (DILAUDID) injection 2 mg ( Subcutaneous See Alternative 12/23/17 0235)  HYDROmorphone (DILAUDID) injection 2 mg (2 mg Intravenous Given 12/23/17 0314)    Or  HYDROmorphone (DILAUDID) injection 2 mg ( Subcutaneous See Alternative 12/23/17 0314)  diphenhydrAMINE (BENADRYL) injection 25 mg (25 mg Intravenous Given 12/23/17 0151)  Procedures Procedures  (including critical care time)  Medical Decision Making / ED Course I have reviewed the nursing notes for this encounter and the patient's prior records (if available in EHR or on provided paperwork).    Sickle cell pain.  On review of records it was noted the patient left AGAINST MEDICAL ADVICE after her Dilaudid PCA was discontinued and she was placed on p.o. narcotics.  Also noted that she had positive UDS for cocaine.  There is no physician note providing additional details.  Patient's labs were at her baseline and reassuring.  She was provided with IV pain medicine while in the emergency department and admitted to medicine who placed the patient on oral medication.  Final Clinical Impression(s) / ED Diagnoses Final diagnoses:  Sickle cell pain crisis [facility])      This chart was dictated using voice recognition software.  Despite best efforts to proofread,  errors can occur  which can change the documentation meaning.   Fatima Blank, MD 12/23/17 [phone]

## 2017-12-23 NOTE — ED Notes (Signed)
ED TO INPATIENT HANDOFF REPORT  Name/Age/Gender [name] 27 y.o. female  Code Status    Code Status Orders  (From admission, onward)         Start     Ordered   12/23/17 0533  Full code  Continuous     12/23/17 0541        Code Status History    Date Active Date Inactive Code Status Order ID Comments User Context   12/22/2017 0108 12/22/2017 2124 Full Code [id number]  Karmen Bongo, MD ED   11/19/2017 2053 11/23/2017 1241 Full Code [id number]  Elwyn Reach, MD Inpatient   10/06/2017 0002 10/09/2017 1803 Full Code [id number]  Rise Patience, MD ED   09/30/2017 0122 10/03/2017 2319 Full Code [id number]  Toy Baker, MD Inpatient   07/10/2017 1947 07/13/2017 1916 Full Code [id number]  Elwyn Reach, MD ED   07/02/2017 0848 07/07/2017 2044 Full Code [id number]  Elwyn Reach, MD Inpatient   05/27/2017 2139 06/01/2017 1937 Full Code [id number]  Etta Quill, DO ED   05/04/2017 2105 05/07/2017 2127 Full Code [id number]  Rise Patience, MD ED   04/01/2017 1813 04/03/2017 1841 Full Code [id number]  Aline August, MD ED   02/28/2017 0045 03/05/2017 2133 Full Code [id number]  Etta Quill, DO ED   02/02/2017 0309 02/04/2017 2250 Full Code [id number]  Bethena Roys, MD Inpatient   12/10/2016 0403 12/14/2016 1705 Full Code [id number]  Etta Quill, DO ED   11/06/2016 2039 11/11/2016 1659 Full Code [id number]  Patrecia Pour, MD Inpatient   10/06/2016 1647 10/09/2016 1809 Full Code [id number]  [name], MD ED   10/02/2016 1512 10/05/2016 1042 Full Code [id number]  Tresa Garter, MD Inpatient   09/15/2016 1723 09/19/2016 2025 Full Code [id number]  Dorena Dew, [place] Inpatient   08/20/2016 1812 08/30/2016 1613 Full Code [id number]  [name], MD Inpatient   07/25/2016 2320 07/29/2016 2126 Full Code [id number]  Norval Morton, MD ED   06/26/2016 2154 07/02/2016 1954 Full Code [id number]  Etta Quill, DO ED   06/10/2016 0511 06/15/2016 1511 Full  Code [id number]  Etta Quill, DO ED   05/29/2016 2007 05/31/2016 1803 Full Code [id number]  Cristal Ford, DO Inpatient   05/19/2016 1914 05/25/2016 2005 Full Code [id number]  Charlynne Cousins, MD Inpatient   04/24/2016 1059 05/02/2016 1750 Full Code [id number]  Elwyn Reach, MD Inpatient   04/17/2016 2105 04/20/2016 1913 Full Code [id number]  Reubin Milan, MD Inpatient   04/17/2016 2105 04/17/2016 2105 Full Code [id number]  Reubin Milan, MD Inpatient   03/31/2016 1638 04/07/2016 1808 Full Code [id number]  [name], MD Inpatient   02/26/2016 1859 03/03/2016 2104 Full Code [id number]  [name], MD Inpatient   02/16/2016 1033 02/17/2016 1101 Full Code [id number]  Tresa Garter, MD Inpatient   01/30/2016 0751 02/05/2016 1904 Full Code [id number]  Ivor Costa, MD ED   01/05/2016 0837 01/09/2016 1727 Full Code [id number]  Tresa Garter, MD Inpatient   12/27/2015 0358 12/29/2015 2009 Full Code [id number]  Ivor Costa, MD ED   11/09/2015 0118 11/12/2015 1837 Full Code [id number]  Norval Morton, MD ED   10/31/2015 1854 11/02/2015 1456 Full Code [id number]  Manya Silvas, [place] Inpatient   10/30/2015 1446 10/31/2015 1854 Full Code [id number]  Lavonia Drafts, MD Inpatient   10/26/2015 2125 10/30/2015 1446 Full Code [id number]  Gwynne Edinger, MD Inpatient   10/15/2015  1857 10/18/2015 2027 Full Code [id number]  [name], MD Inpatient   10/06/2015 2258 10/10/2015 1623 Full Code [id number]  Lily Kocher, MD ED   09/22/2015 2026 09/27/2015 1735 Full Code [id number]  Osborne Oman, MD Inpatient   09/07/2015 1431 09/14/2015 1454 Full Code [id number]  [name], MD Inpatient   08/10/2015 1405 08/17/2015 1638 Full Code [id number]  [name], MD Inpatient   07/24/2015 0531 08/02/2015 1817 Full Code [id number]  Carlyle Dolly, MD Inpatient   07/24/2015 0447 07/24/2015 0531 Full Code [id number]  Carlyle Dolly, MD Inpatient   07/24/2015 0353  07/24/2015 0447 Full Code [id number]  Carlyle Dolly, MD Inpatient   07/12/2015 0214 07/14/2015 2028 Full Code [id number]  Ivor Costa, MD ED   06/28/2015 1037 07/03/2015 1441 Full Code [id number]  [name], MD Inpatient   05/31/2015 1459 06/05/2015 2049 Full Code [id number]  [name], MD ED   05/02/2015 1639 05/08/2015 1644 Full Code [id number]  [name], MD Inpatient   04/14/2015 1741 04/22/2015 2006 Full Code [id number]  Tresa Garter, MD Inpatient   04/14/2015 1741 04/14/2015 1741 Full Code [id number]  Tresa Garter, MD Inpatient   04/14/2015 1153 04/14/2015 1741 Full Code [id number]  Tresa Garter, MD Inpatient   03/30/2015 1104 04/03/2015 1742 Full Code [id number]  Tresa Garter, MD Inpatient   03/22/2015 2042 03/26/2015 1747 Full Code [id number]  Reubin Milan, MD ED   01/25/2015 1323 01/27/2015 1554 Full Code [id number]  Elwyn Reach, MD Inpatient   12/07/2014 1557 12/10/2014 1720 Full Code [id number]  Keitha Butte, CNM Inpatient   12/05/2014 0920 12/07/2014 1557 Full Code [id number]  Tami Ribas, RN Inpatient   11/26/2014 2216 12/01/2014 2033 Full Code [id number]  Virginia Crews, MD Inpatient   11/19/2014 0129 11/21/2014 1812 Full Code [id number]  Seabron Spates, CNM Inpatient   10/23/2014 2118 10/30/2014 1932 Full Code [id number]  Truett Mainland, DO Inpatient   10/11/2014 0010 10/14/2014 1707 Full Code [id number]  Larey Days, [place] Inpatient   09/18/2014 1545 09/26/2014 1656 Full Code [id number]  Lavonia Drafts, MD Inpatient   09/08/2014 0117 09/10/2014 1821 Full Code [id number]  Lavonia Drafts, MD Inpatient   09/08/2014 0117 09/08/2014 0117 Full Code [id number]  Lavonia Drafts, MD Inpatient   08/09/2014 2137 08/15/2014 2048 Full Code [id number]  Tresea Mall, CNM Inpatient   07/14/2014 0301 07/16/2014 1653 Full Code [id number]  Brantley Persons, RN Inpatient   06/25/2014 1651 06/28/2014 1705 Full Code  [id number]  [name], MD Inpatient   06/02/2014 0106 06/05/2014 1936 Full Code [id number]  Elberta Leatherwood, MD Inpatient   05/03/2014 1818 05/06/2014 2158 Full Code [id number]  Markus Daft, MD Inpatient   05/02/2014 1207 05/03/2014 1818 Full Code [id number]  [name], MD ED   04/10/2014 1641 04/18/2014 2117 Full Code [id number]  Greggory Keen, MD Inpatient   04/09/2014 1028 04/10/2014 1641 Full Code [id number]  Theodis Blaze, MD Inpatient   02/16/2014 2020 02/25/2014 1411 Full Code [id number]  Caren Griffins, MD ED   01/29/2014 0343 02/01/2014 1442 Full Code [id number]  Rise Patience, MD Inpatient   01/13/2014 1815 01/16/2014 1304 Full Code [id number]  Robbie Lis, MD Inpatient   11/25/2013 1437 11/30/2013 2009 Full Code [id number]  Verlee Monte, MD Inpatient   09/24/2013 0615 09/27/2013 1532 Full Code [id number]  Rise Patience, MD Inpatient   07/31/2013 [phone] 08/04/2013 2047 Full Code [id number]  Etta Quill, DO ED   07/16/2013 0907 07/16/2013 2310 Full Code [id number]  [name], MD Inpatient   05/28/2013 0515 06/01/2013 2117 Full Code [id number]  Berle Mull, MD ED   03/23/2013 0430 03/29/2013 1937 Full Code [id number]  Theressa Millard, MD ED   02/11/2013 0803 02/14/2013 1639 Full Code [id number]  Rise Patience, MD Inpatient   01/27/2013 1257 01/30/2013 1853 Full Code [id number]  Domenic Polite, MD Inpatient   10/05/2012 2154 10/12/2012 2107 Full Code [id number]  Theressa Millard, MD ED   08/27/2012 1955 08/31/2012 1833 Full Code [id number]  Thurnell Lose, MD Inpatient   06/22/2012 1436 06/27/2012 1457 Full Code [id number]  Jorene Guest, RN Inpatient   03/28/2012 2122 04/04/2012 1929 Full Code [id number]  Verl Dicker, PA-C ED   03/24/2012 0432 03/25/2012 1144 Full Code [id number]  Martie Lee, [place] ED   03/04/2012 0448 03/15/2012 2258 Full Code [id number]  Kennith Center, RN Inpatient   02/13/2012 0647 02/17/2012 0124 Full Code [id number]  Jacqualyn Posey, RN  Inpatient   12/17/2011 1614 12/22/2011 1716 Full Code [id number]  Theodis Blaze, MD Inpatient   12/16/2011 2205 12/17/2011 1614 Full Code [id number]  Threasa Beards, MD ED   11/30/2011 0309 12/03/2011 1745 Full Code [id number]  Evelina Dun, RN ED   11/08/2011 0723 11/14/2011 1823 Full Code [id number]  Ron Parker, RN ED   09/05/2011 0204 09/05/2011 0732 Full Code [id number]  Garald Balding, NP ED   08/05/2011 0755 08/05/2011 1852 Full Code [id number]  Rainey Pines, RN ED   08/05/2011 0028 08/05/2011 0755 Full Code [id number]  Charlena Cross, MD ED   07/13/2011 1149 07/15/2011 1847 Full Code [id number]  Julieta Bellini, NP ED   06/09/2011 0048 06/09/2011 0959 Full Code [id number]  Charlena Cross, MD ED   05/31/2011 1828 06/01/2011 1837 Full Code [id number]  Jenel Lucks, RN Inpatient   05/26/2011 0218 05/26/2011 1419 Full Code [id number]  Molpus, Karen Chafe, MD ED   05/06/2011 0700 05/08/2011 1609 Full Code [id number]  [place], [place], MD Inpatient   04/17/2011 1329 04/19/2011 1650 Full Code [id number]  Judithann Sheen, MD ED   03/30/2011 0039 04/03/2011 0025 Full Code [id number]  Hoy Morn, MD ED      Home/SNF/Other Home  Chief Complaint sickle cell pain   Level of Care/Admitting Diagnosis ED Disposition    ED Disposition Condition [facility] Area: [facility] [[id number]]  Level of Care: Med-Surg [16]  Diagnosis: Sickle cell anemia [facility]) [[id number]]  Admitting Physician: Etta Quill [phone]  Attending Physician: Etta Quill [[id number]]  PT Class (Do Not Modify): Observation [104]  PT Acc Code (Do Not Modify): Observation [10022]       Medical History Past Medical History:  Diagnosis Date  . Blood transfusion    "lots"  . Chronic back pain    "very severe; have knot in my back; from tight muscle; take RX and exercise for it"  . Depression 01/06/2011  . Exertional dyspnea    "sometimes"  . Genital HSV   . GERD (gastroesophageal reflux disease)  02/17/2011  . Migraines 11/08/11   "@ least twice/month"  . 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  . Mood swings 11/08/11   "I go back and forth; real bad"  . Sickle cell anemia ([name])   . Sickle cell anemia with crisis ([place])   . Thrombocytosis (  [place]) 11/09/2015  . Trichotillomania    h/o    Allergies Allergies  Allergen Reactions  . Carrot [Daucus Carota] Hives and Swelling    Allergic to carrots  . Latex Rash    IV Location/Drains/Wounds Patient Lines/Drains/Airways Status   Active Line/Drains/Airways    Name:   Placement date:   Placement time:   Site:   Days:   Implanted Port Right Chest   -    -    Chest             Labs/Imaging Results for orders placed or performed during the hospital encounter of 12/23/17 (from the past 48 hour(s))  Reticulocytes     Status: Abnormal   Collection Time: 12/23/17  1:55 AM  Result Value Ref Range   Retic Ct Pct >23.0 (H) 0.4 - 3.1 %   RBC. 2.35 (L) 3.87 - 5.11 MIL/uL   Retic Count, Absolute NOT CALCULATED 19.0 - 186.0 K/uL    Comment: Performed at [facility], [place] [address], [place], [place] [address]  Comprehensive metabolic panel     Status: Abnormal   Collection Time: 12/23/17  1:55 AM  Result Value Ref Range   Sodium 137 135 - 145 mmol/L   Potassium 3.8 3.5 - 5.1 mmol/L   Chloride 104 98 - 111 mmol/L   CO2 24 22 - 32 mmol/L   Glucose, Bld 96 70 - 99 mg/dL   BUN 8 6 - 20 mg/dL   Creatinine, Ser 0.76 0.44 - 1.00 mg/dL   Calcium 8.7 (L) 8.9 - 10.3 mg/dL   Total Protein 6.5 6.5 - 8.1 g/dL   Albumin 3.7 3.5 - 5.0 g/dL   AST 18 15 - 41 U/L   ALT 17 0 - 44 U/L   Alkaline Phosphatase 68 38 - 126 U/L   Total Bilirubin 1.8 (H) 0.3 - 1.2 mg/dL   GFR calc non Af Amer >60 >60 mL/min   GFR calc Af Amer >60 >60 mL/min    Comment: (NOTE) The eGFR has been calculated using the CKD EPI equation. This calculation has not been validated in all clinical situations. eGFR's persistently <60 mL/min signify  possible Chronic Kidney Disease.    Anion gap 9 5 - 15    Comment: Performed at [facility], [place] [address] [place], [place] [address]  CBC WITH DIFFERENTIAL     Status: Abnormal   Collection Time: 12/23/17  1:55 AM  Result Value Ref Range   WBC 14.7 (H) 4.0 - 10.5 K/uL   RBC 2.35 (L) 3.87 - 5.11 MIL/uL   Hemoglobin 7.3 (L) 12.0 - 15.0 g/dL   HCT 21.5 (L) 36.0 - 46.0 %   MCV 91.5 78.0 - 100.0 fL   MCH 31.1 26.0 - 34.0 pg   MCHC 34.0 30.0 - 36.0 g/dL   RDW 22.3 (H) 11.5 - 15.5 %   Platelets 563 (H) 150 - 400 K/uL   Neutrophils Relative % 72 %   Neutro Abs 10.5 (H) 1.7 - 7.7 K/uL   Lymphocytes Relative 17 %   Lymphs Abs 2.5 0.7 - 4.0 K/uL   Monocytes Relative 8 %   Monocytes Absolute 1.2 (H) 0.1 - 1.0 K/uL   Eosinophils Relative 3 %   Eosinophils Absolute 0.4 0.0 - 0.7 K/uL   Basophils Relative 0 %   Basophils Absolute 0.1 0.0 - 0.1 K/uL   RBC Morphology POLYCHROMASIA PRESENT     Comment: TARGET CELLS Sickle cells present Performed at  [facility], [place] [address] [place], [place] [address]    *Note: Due to a large number of results and/or encounters for the requested time period, some results have not been displayed. A complete set of results can be found in Results Review.   No results found.  Pending Labs Unresulted Labs (From admission, onward)   None      Vitals/Pain Today's Vitals   12/23/17 0335 12/23/17 0353 12/23/17 0400 12/23/17 0530  BP: 110/70  95/64 98/62  Pulse: 88  90 90  Resp: 13  18 18   Temp:      TempSrc:      SpO2: 94%  100% 100%  Weight:      PainSc:  (S) 7       Isolation Precautions No active isolations  Medications Medications  dextrose 5 %-0.45 % sodium chloride infusion ( Intravenous Transfusing/Transfer 12/23/17 0555)  ondansetron (ZOFRAN) injection 4 mg (4 mg Intravenous Given 12/23/17 0151)  oxyCODONE (Oxy IR/ROXICODONE) immediate release tablet 20 mg (has no administration in time range)   morphine (MS CONTIN) 12 hr tablet 30 mg (has no administration in time range)  hydroxyurea (HYDREA) capsule 1,500 mg (has no administration in time range)  senna-docusate (Senokot-S) tablet 1 tablet (has no administration in time range)  polyethylene glycol (MIRALAX / GLYCOLAX) packet 17 g (has no administration in time range)  enoxaparin (LOVENOX) injection 40 mg (has no administration in time range)  ketorolac (TORADOL) 30 MG/ML injection 30 mg (has no administration in time range)  ketorolac (TORADOL) 30 MG/ML injection 30 mg (30 mg Intravenous Given 12/23/17 0151)  HYDROmorphone (DILAUDID) injection 2 mg (2 mg Intravenous Given 12/23/17 0151)    Or  HYDROmorphone (DILAUDID) injection 2 mg ( Subcutaneous See Alternative 12/23/17 0151)  HYDROmorphone (DILAUDID) injection 2 mg (2 mg Intravenous Given 12/23/17 0235)    Or  HYDROmorphone (DILAUDID) injection 2 mg ( Subcutaneous See Alternative 12/23/17 0235)  HYDROmorphone (DILAUDID) injection 2 mg (2 mg Intravenous Given 12/23/17 0314)    Or  HYDROmorphone (DILAUDID) injection 2 mg ( Subcutaneous See Alternative 12/23/17 0314)  diphenhydrAMINE (BENADRYL) injection 25 mg (25 mg Intravenous Given 12/23/17 0151)    Mobility walks

## 2017-12-23 NOTE — H&P (Signed)
History and Physical    [name]:[id number] DOB: 1990/08/05 DOA: 12/23/2017  PCP: Azzie Glatter, FNP  Patient coming from: Home  I have personally briefly reviewed patient's old medical records in [place]  Chief Complaint: Leg pain  HPI: [name] is a 27 y.o. female with medical history significant of sickle cell anemia, substance abuse, chronic back pain.  Patient was admitted for sickle cell pain in L leg yesterday early morning.  She was initially put on a PCA for pain control.  Her UDS came back positive for cocaine.  It seems Dr. Doreene Burke then discontinued the PCA and put patient back on PO short and long acting opiates for pain control.  Patient was upset by this (see Danton Clap RN notes at 8:16 and 8:48 pm), and left AMA going directly to the ED.   ED Course: The ED, being unaware of what exactly had happened, put her on the sickle cell protocol giving her the usual IV dilaudid bolus dosing.  I was called for admission at 5am.   Review of Systems: As per HPI otherwise 10 point review of systems negative.   Past Medical History:  Diagnosis Date  . Blood transfusion    "lots"  . Chronic back pain    "very severe; have knot in my back; from tight muscle; take RX and exercise for it"  . Depression 01/06/2011  . Exertional dyspnea    "sometimes"  . Genital HSV   . GERD (gastroesophageal reflux disease) 02/17/2011  . Migraines 11/08/11   "@ least twice/month"  . 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  . Mood swings 11/08/11   "I go back and forth; real bad"  . Sickle cell anemia ([name])   . Sickle cell anemia with crisis ([place])   . Thrombocytosis ([place]) 11/09/2015  . Trichotillomania    h/o    Past Surgical History:  Procedure Laterality Date  . CHOLECYSTECTOMY  05/2010  . DILATION AND CURETTAGE OF UTERUS  02/20/11   S/P miscarriage  . IR GENERIC HISTORICAL  12/23/2015   IR FLUORO GUIDE CV LINE RIGHT 12/23/2015 Jacqulynn Cadet, MD [facility]-INTERV RAD  . IR GENERIC HISTORICAL  12/23/2015   IR US GUIDE VASC ACCESS RIGHT 12/23/2015 Jacqulynn Cadet, MD [facility]-INTERV RAD     reports that she has been smoking cigarettes. She has a 0.25 pack-year smoking history. She has never used smokeless tobacco. She reports that she does not drink alcohol or use drugs.  Allergies  Allergen Reactions  . Carrot [Daucus Carota] Hives and Swelling    Allergic to carrots  . Latex Rash    Family History  Problem Relation Age of Onset  . Sickle cell trait Mother   . Sickle cell trait Father   . Diabetes Maternal Grandmother   . Diabetes Paternal Grandmother   . Hypertension Paternal Grandmother   . Diabetes Maternal Grandfather      Prior to Admission medications   Medication Sig Start Date End Date Taking? Authorizing Provider  aspirin-acetaminophen-caffeine (EXCEDRIN MIGRAINE) [phone] MG tablet Take 2 tablets every 6 (six) hours as needed by mouth for headache.   Yes [provider]  hydrocortisone cream 1 % Apply 1 application topically 2 (two) times daily as needed for itching.   Yes [provider]  hydroxyurea (HYDREA) 500 MG capsule Take 2 tabs (1000 mg ) PO QOD starting on 06/02/2017 alternating with 1 tab (500 mg) PO QOD starting on 06/03/2017. May take with  food to minimize GI side effects. Patient taking differently: Take 1,500 mg by mouth daily. May take with food to minimize GI side effects. 07/24/17  Yes Scot Jun, FNP  Menthol-Methyl Salicylate (MUSCLE RUB EX) Apply 1 application topically 3 (three) times daily as needed (pain).   Yes [provider]  morphine (MS CONTIN) 30 MG 12 hr tablet Take 1 tablet (30 mg total) by mouth every 12 (twelve) hours. 11/23/17  Yes Dorena Dew, FNP  Oxycodone HCl 20 MG TABS Take 1 tablet (20 mg total) by mouth every 6 (six) hours as needed (pain). 11/23/17  Yes Dorena Dew, FNP    Physical Exam: Vitals:   12/23/17 0230 12/23/17 0316  12/23/17 0335 12/23/17 0400  BP: (!) 112/58 105/62 110/70 95/64  Pulse: 78 94 88 90  Resp: 16 18 13 18   Temp:      TempSrc:      SpO2: 96% 95% 94% 100%  Weight:        Constitutional: NAD, calm, comfortable Eyes: PERRL, lids and conjunctivae normal ENMT: Mucous membranes are moist. Posterior pharynx clear of any exudate or lesions.Normal dentition.  Neck: normal, supple, no masses, no thyromegaly Respiratory: clear to auscultation bilaterally, no wheezing, no crackles. Normal respiratory effort. No accessory muscle use.  Cardiovascular: Regular rate and rhythm, no murmurs / rubs / gallops. No extremity edema. 2+ pedal pulses. No carotid bruits.  Abdomen: no tenderness, no masses palpated. No hepatosplenomegaly. Bowel sounds positive.  Musculoskeletal: no clubbing / cyanosis. No joint deformity upper and lower extremities. Good ROM, no contractures. Normal muscle tone.  Skin: no rashes, lesions, ulcers. No induration Neurologic: CN 2-12 grossly intact. Sensation intact, DTR normal. Strength 5/5 in all 4.  Psychiatric: Normal judgment and insight. Alert and oriented x 3. Normal mood.    Labs on Admission: I have personally reviewed following labs and imaging studies  CBC: Recent Labs  Lab 12/21/17 2224 12/23/17 0155  WBC 13.7* 14.7*  NEUTROABS 9.2 10.5*  HGB 7.6* 7.3*  HCT 22.2* 21.5*  MCV 90.6 91.5  PLT 555* 128*   Basic Metabolic Panel: Recent Labs  Lab 12/21/17 2224 12/23/17 0155  NA 141 137  K 3.8 3.8  CL 107 104  CO2 27 24  GLUCOSE 99 96  BUN 7 8  CREATININE 0.67 0.76  CALCIUM 8.9 8.7*   GFR: Estimated Creatinine Clearance: 111.5 mL/min (by C-G formula based on SCr of 0.76 mg/dL). Liver Function Tests: Recent Labs  Lab 12/21/17 2224 12/23/17 0155  AST 19 18  ALT 16 17  ALKPHOS 77 68  BILITOT 2.0* 1.8*  PROT 7.0 6.5  ALBUMIN 3.9 3.7   No results for input(s): LIPASE, AMYLASE in the last 168 hours. No results for input(s): AMMONIA in the last 168  hours. Coagulation Profile: No results for input(s): INR, PROTIME in the last 168 hours. Cardiac Enzymes: No results for input(s): CKTOTAL, CKMB, CKMBINDEX, TROPONINI in the last 168 hours. BNP (last 3 results) No results for input(s): PROBNP in the last 8760 hours. HbA1C: No results for input(s): HGBA1C in the last 72 hours. CBG: No results for input(s): GLUCAP in the last 168 hours. Lipid Profile: No results for input(s): CHOL, HDL, LDLCALC, TRIG, CHOLHDL, LDLDIRECT in the last 72 hours. Thyroid Function Tests: No results for input(s): TSH, T4TOTAL, FREET4, T3FREE, THYROIDAB in the last 72 hours. Anemia Panel: Recent Labs    12/21/17 2224 12/23/17 0155  RETICCTPCT >23.0* >23.0*   Urine analysis:    Component  Value Date/Time   COLORURINE COLORLESS (A) 11/21/2017 0400   APPEARANCEUR CLEAR 11/21/2017 0400   APPEARANCEUR Clear 05/06/2012 1546   LABSPEC 1.000 (L) 11/21/2017 0400   LABSPEC 1.011 05/06/2012 1546   PHURINE 6.0 11/21/2017 0400   GLUCOSEU NEGATIVE 11/21/2017 0400   GLUCOSEU Negative 05/06/2012 1546   HGBUR NEGATIVE 11/21/2017 0400   HGBUR negative 01/08/2009 1329   BILIRUBINUR NEGATIVE 11/21/2017 0400   BILIRUBINUR negative 10/18/2017 1432   BILIRUBINUR Negative 05/06/2012 1546   KETONESUR NEGATIVE 11/21/2017 0400   PROTEINUR NEGATIVE 11/21/2017 0400   UROBILINOGEN 0.2 10/18/2017 1432   UROBILINOGEN 0.2 07/24/2017 1347   NITRITE NEGATIVE 11/21/2017 0400   LEUKOCYTESUR NEGATIVE 11/21/2017 0400   LEUKOCYTESUR Negative 05/06/2012 1546    Radiological Exams on Admission: No results found.  EKG: Independently reviewed.  Assessment/Plan Principal Problem:   Sickle cell anemia ([name]) Active Problems:   Chronic pain   Substance abuse ([name])   History of DVT (deep vein thrombosis)    1. Sickle cell anemia, chronic pain, complicated by substance abuse - 1. Sickle cell pathway 2. Scheduled toradol 3. Therefore I plan to put her back on the MS contin  scheduled and Oxy IR that Dr. Doreene Burke had ordered prior to her leaving AMA yesterday evening. 4. Pain is reasonably controled at the moment and for the immediate future (especially since ED, not knowing what had transpired gave her multiple rounds of IV dilaudid). 5. Discussed with patient, she understands that if she disagrees with the management plan, she will need to discuss with Dr. Doreene Burke when he comes in this morning (his shift begins 1 hour from now), she understands that I am not in a position to over-rule Dr. Christa See decision making regarding narcotics, substance abuse, etc. 6. While I didn't directly discuss it with patient, I do happen to share Dr. Christa See concerns regarding concurrent use of other substances (cocaine) in this patient, concern for substance abuse, and support his undoubtedly difficult decision regarding appropriateness or not of IV narcotics for this patient.  DVT prophylaxis: Lovenox Code Status: Full Family Communication: No family in room Disposition Plan: Home after admit Consults called: None Admission status: Place in obs   Kadesha Virrueta, [place] [facility] Pager [phone] Only works nights!  If 7AM-7PM, please contact the primary day team physician taking care of patient  [email or web address] Password [facility]  12/23/2017, 5:41 AM

## 2017-12-23 NOTE — Discharge Summary (Signed)
Physician Discharge Summary  [name] ZJQ:[id number] DOB: 1991/04/15 DOA: 12/23/2017  PCP: Nancy Glatter, FNP  Admit date: 12/23/2017  Discharge date: 12/23/2017  Discharge Diagnoses:  Principal Problem:   Sickle cell anemia ([name]) Active Problems:   Chronic pain   Substance abuse [facility])  Discharge Condition: Stable  Disposition:  Follow-up Information    Nancy Glatter, FNP. Call in 2 day(s).   Specialty:  Family Medicine Contact information: [place] [address] [phone]          Pt is discharged home in good condition and is to follow up with Nancy Glatter, FNP this week to have labs evaluated. [name] is instructed to increase activity slowly and balance with rest for the next few days, and use prescribed medication to complete treatment of pain  Diet: Regular Wt Readings from Last 3 Encounters:  12/23/17 93.1 kg  12/21/17 92.5 kg  11/23/17 93.2 kg   History of present illness:  [name] is a 27 y.o. female with medical history significant of sickle cell anemia, substance abuse, chronic back pain.  Patient was admitted for sickle cell pain in L leg yesterday early morning. She was initially put on a PCA for pain control. Her UDS came back positive for cocaine. It seems Dr. Doreene Burke then discontinued the PCA and put patient back on PO short and long acting opiates for pain control. Patient was upset by this (see Danton Clap RN notes at 8:16 and 8:48 pm), and left AMA going directly to the ED.  ED Course: The ED, being unaware of what exactly had happened, put her on the sickle cell protocol giving her the usual IV dilaudid bolus dosing.  I was called for admission at [facility] Course:  Patient was re-admitted for sickle cell pain and managed appropriately with IVF and IV Toradol, as well as other adjunct therapies per sickle cell pain management protocols. Patient tested positive for Cocaine again and  for that reason as we agreed before, she will not receive IV Narcotics during admissions when she is positive for cocaine or any illicit drugs. She has also been reported severally by [facility] of forging prescription papers for controlled substances, the last was on August 6. According to PMP Aware, her last medication refill was in June, but since then she had attempted to get forged prescription filled at least 3x at different [facility] locations in the past month. She left the hospital AMA yesterday when her Dilaudid PCA was turned off after her UDS came back positive for cocaine. She was hemodynamically stable throughout this admission. She requests to be discharged today since she is not getting Dilaudid. She claims her pain is back to baseline and pleaded for prescription of her home pain medications to help her with breakthrough pain. She was prescribed 5 days of MS Contin 30 mg Q12H #10 tabs and Oxycodone 20 mg #30 tablets. I received a call from [facility] about the concerns that patient has been flagged in their system because of forged prescriptions, but since her last filled prescription was in June, it is my medical opinion that patient will benefit from at least few days worth of pain medication until she is able to get back to her regular PCP for ongoing pain management. Patient was discharged home today in a hemodynamically stable condition.   Discharge Exam: Vitals:   12/23/17 0530 12/23/17 0656  BP: 98/62 127/70  Pulse: 90 74  Resp: 18 18  Temp:  98.9 F (37.2 C)  SpO2: 100% 100%   Vitals:   12/23/17 0400 12/23/17 0530 12/23/17 0654 12/23/17 0656  BP: 95/64 98/62  127/70  Pulse: 90 90  74  Resp: 18 18  18   Temp:    98.9 F (37.2 C)  TempSrc:      SpO2: 100% 100%  100%  Weight:   93.1 kg   Height:   5\' 1"  (1.549 m)     General appearance : Awake, alert, not in any distress. Speech Clear. Not toxic looking HEENT: Atraumatic and Normocephalic, pupils equally reactive to light and  accomodation Neck: Supple, no JVD. No cervical lymphadenopathy.  Chest: Good air entry bilaterally, no added sounds  CVS: S1 S2 regular, no murmurs.  Abdomen: Bowel sounds present, Non tender and not distended with no gaurding, rigidity or rebound. Extremities: B/L Lower Ext shows no edema, both legs are warm to touch Neurology: Awake alert, and oriented X 3, CN II-XII intact, Non focal Skin: No Rash  Discharge Instructions  Discharge Instructions    Diet - low sodium heart healthy   Complete by:  As directed    Increase activity slowly   Complete by:  As directed      Allergies as of 12/23/2017      Reactions   Carrot [daucus Carota] Hives, Swelling   Allergic to carrots   Latex Rash      Medication List    TAKE these medications   aspirin-acetaminophen-caffeine 250-250-65 MG tablet Commonly known as:  EXCEDRIN MIGRAINE Take 2 tablets every 6 (six) hours as needed by mouth for headache.   hydrocortisone cream 1 % Apply 1 application topically 2 (two) times daily as needed for itching.   hydroxyurea 500 MG capsule Commonly known as:  HYDREA Take 2 tabs (1000 mg ) PO QOD starting on 06/02/2017 alternating with 1 tab (500 mg) PO QOD starting on 06/03/2017. May take with food to minimize GI side effects. What changed:    how much to take  how to take this  when to take this  additional instructions   morphine 30 MG 12 hr tablet Commonly known as:  MS CONTIN Take 1 tablet (30 mg total) by mouth every 12 (twelve) hours for 5 days.   MUSCLE RUB EX Apply 1 application topically 3 (three) times daily as needed (pain).   Oxycodone HCl 20 MG Tabs Take 1 tablet (20 mg total) by mouth every 4 (four) hours as needed for up to 5 days (pain). What changed:  when to take this       The results of significant diagnostics from this hospitalization (including imaging, microbiology, ancillary and laboratory) are listed below for reference.    Significant Diagnostic  Studies: No results found.  Microbiology: No results found for this or any previous visit (from the past 240 hour(s)).   Labs: Basic Metabolic Panel: Recent Labs  Lab 12/21/17 2224 12/23/17 0155  NA 141 137  K 3.8 3.8  CL 107 104  CO2 27 24  GLUCOSE 99 96  BUN 7 8  CREATININE 0.67 0.76  CALCIUM 8.9 8.7*   Liver Function Tests: Recent Labs  Lab 12/21/17 2224 12/23/17 0155  AST 19 18  ALT 16 17  ALKPHOS 77 68  BILITOT 2.0* 1.8*  PROT 7.0 6.5  ALBUMIN 3.9 3.7   No results for input(s): LIPASE, AMYLASE in the last 168 hours. No results for input(s): AMMONIA in the last 168 hours. CBC: Recent Labs  Lab 12/21/17 2224 12/23/17 0155  WBC 13.7* 14.7*  NEUTROABS 9.2 10.5*  HGB 7.6* 7.3*  HCT 22.2* 21.5*  MCV 90.6 91.5  PLT 555* 563*   Cardiac Enzymes: No results for input(s): CKTOTAL, CKMB, CKMBINDEX, TROPONINI in the last 168 hours. BNP: Invalid input(s): POCBNP CBG: No results for input(s): GLUCAP in the last 168 hours.  Time coordinating discharge: 50 minutes  Signed:  [facility] 12/23/2017, 1:57 PM

## 2017-12-26 NOTE — Discharge Summary (Signed)
Physician Discharge Summary  [name] OEV:[id number] DOB: 04/20/1991 DOA: 12/21/2017  PCP: Azzie Glatter, FNP  Admit date: 12/21/2017  Discharge date: Left AMA on 12/22/2017  Discharge Diagnoses:  Principal Problem:   Sickle cell anemia with crisis [facility]) Active Problems:   Anemia of chronic disease   Chronic pain   Thrombocytosis ([place])  Discharge Condition: Stable  Disposition:  Pt is discharged home in good condition and is to follow up with Azzie Glatter, FNP this week to have labs evaluated. [name] is instructed to increase activity slowly and balance with rest for the next few days, and use prescribed medication to complete treatment of pain  Diet: Regular Wt Readings from Last 3 Encounters:  12/23/17 93.1 kg  12/21/17 92.5 kg  11/23/17 93.2 kg   History of present illness:  [name] is a 27 y.o. female with medical history significant of sickle cell anemia; substance abuse; thrombocytosis; and chronic back pain presenting with leg pain.  She was last admitted 7/7-11.  She is having sickle cell pain in the left leg from the knee to ankle.  Severe pain with standing/walking.  The pain started last night and her home medications were not helping.  She tried drinking water, using heating bad, stretching, walking - but it was getting worse.  She does usually have pain crises in her legs.  ED Course: Sickle cell pain crisis. Does not present all that frequently. Severe leg pain, refractory to home pain meds.  Hgb 7.6, appears to be at baseline.  Hospital Course:  Patient was admitted for sickle cell pain crisis and managed appropriately with IVF, IV Dilaudid via PCA and IV Toradol, as well as other adjunct therapies per sickle cell pain management protocols. Her urine drug screen came back positive for Cocaine and per previous agreements, we will turn of dilaudid PCA and place patient on PO home pain medications when UDS is +ve. Dilaudid PCA was turned  off, patient because angry and left the hospital AMA. She was hemodynamically stable throughout admission before she left AMA.  Discharge Instructions Left AMA  Allergies as of 12/22/2017      Reactions   Carrot [daucus Carota] Hives, Swelling   Allergic to carrots   Latex Rash      Medication List    ASK your doctor about these medications   aspirin-acetaminophen-caffeine 250-250-65 MG tablet Commonly known as:  EXCEDRIN MIGRAINE Take 2 tablets every 6 (six) hours as needed by mouth for headache.   hydroxyurea 500 MG capsule Commonly known as:  HYDREA Take 2 tabs (1000 mg ) PO QOD starting on 06/02/2017 alternating with 1 tab (500 mg) PO QOD starting on 06/03/2017. May take with food to minimize GI side effects.       The results of significant diagnostics from this hospitalization (including imaging, microbiology, ancillary and laboratory) are listed below for reference.    Significant Diagnostic Studies: No results found.  Microbiology: No results found for this or any previous visit (from the past 240 hour(s)).   Labs: Basic Metabolic Panel: Recent Labs  Lab 12/21/17 2224 12/23/17 0155  NA 141 137  K 3.8 3.8  CL 107 104  CO2 27 24  GLUCOSE 99 96  BUN 7 8  CREATININE 0.67 0.76  CALCIUM 8.9 8.7*   Liver Function Tests: Recent Labs  Lab 12/21/17 2224 12/23/17 0155  AST 19 18  ALT 16 17  ALKPHOS 77 68  BILITOT 2.0* 1.8*  PROT 7.0  6.5  ALBUMIN 3.9 3.7   No results for input(s): LIPASE, AMYLASE in the last 168 hours. No results for input(s): AMMONIA in the last 168 hours. CBC: Recent Labs  Lab 12/21/17 2224 12/23/17 0155  WBC 13.7* 14.7*  NEUTROABS 9.2 10.5*  HGB 7.6* 7.3*  HCT 22.2* 21.5*  MCV 90.6 91.5  PLT 555* 563*   Cardiac Enzymes: No results for input(s): CKTOTAL, CKMB, CKMBINDEX, TROPONINI in the last 168 hours. BNP: Invalid input(s): POCBNP CBG: No results for input(s): GLUCAP in the last 168 hours.  Time coordinating discharge: 50  minutes  Signed:  [facility] 12/26/2017, 6:39 PM

## 2018-01-12 IMAGING — CT CT ANGIO CHEST
2 of 7 series · 18 of 46 positions shown · IV contrast (isovue)
Comparison: 06/10/2016

CLINICAL DATA: Central chest pain after using incentive spirometry.
History of pulmonary embolus and sickle cell anemia. Exertional
dyspnea.

EXAM:
CT ANGIOGRAPHY CHEST WITH CONTRAST
TECHNIQUE: Multidetector CT imaging of the chest was performed using the
standard protocol during bolus administration of intravenous
contrast. Multiplanar CT image reconstructions and MIPs were
obtained to evaluate the vascular anatomy.
CONTRAST:  50 mL Isovue 370

[Series 7: thins · axial · 0.63mm/px · z∈[-237,-49]mm · 15 of 303 slices shown]
[im 17/303  lung]
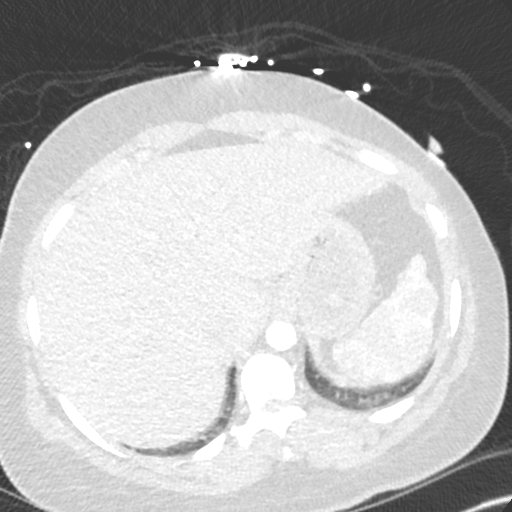
[im 34/303  soft-tissue]
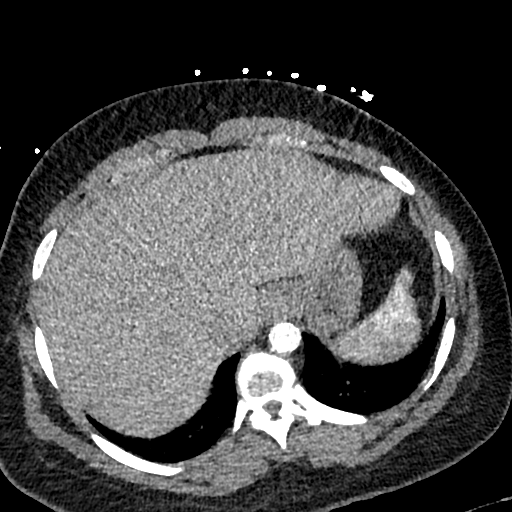
[im 51/303  lung]
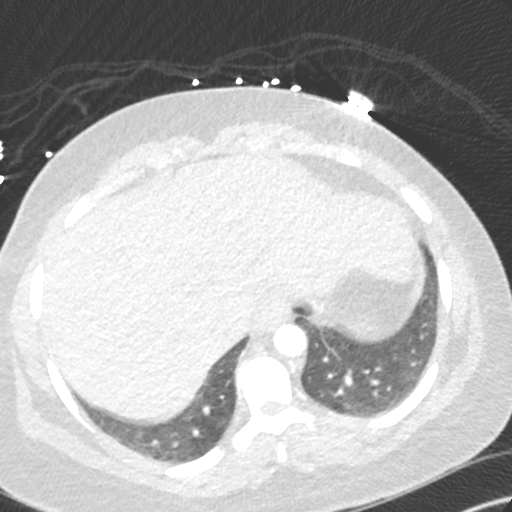
[im 68/303  soft-tissue]
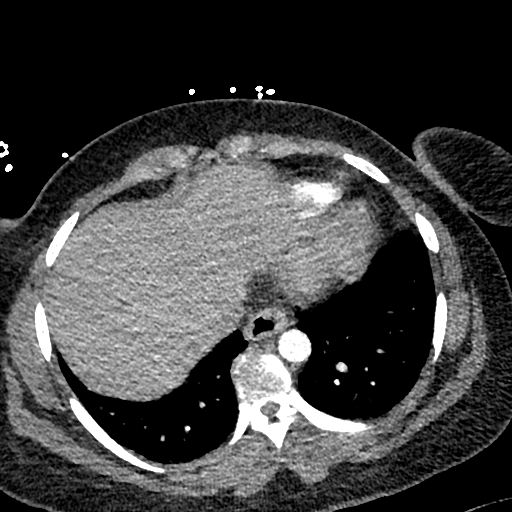
[im 101/303  lung]
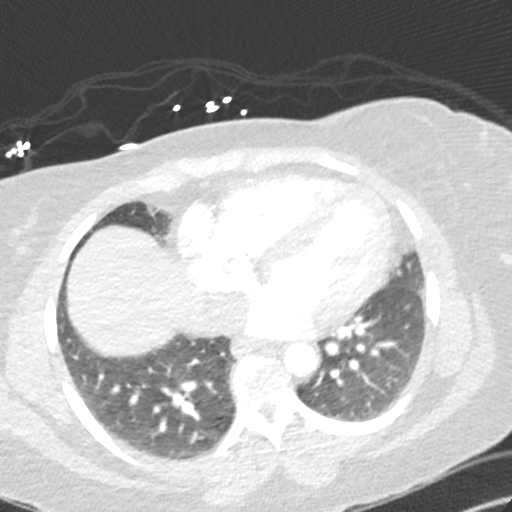
[im 118/303  soft-tissue]
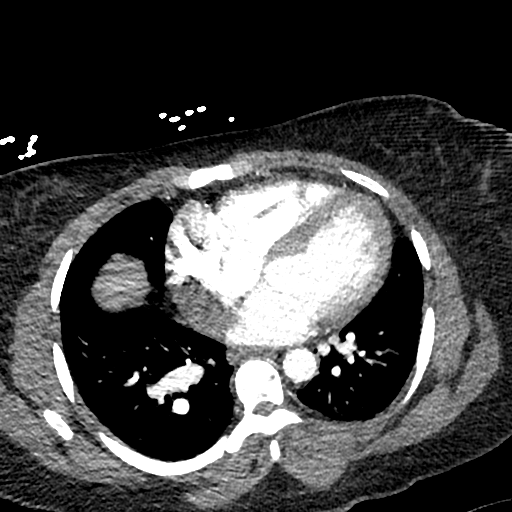
[im 135/303  lung]
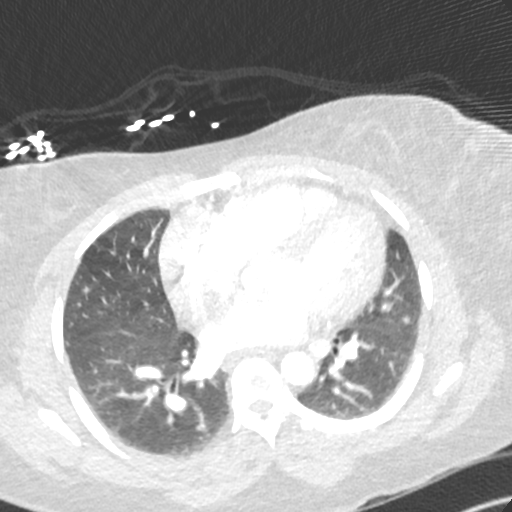
[im 152/303  soft-tissue]
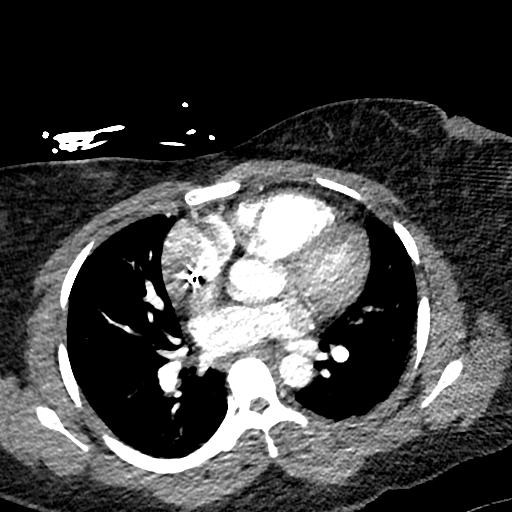
[im 168/303  lung]
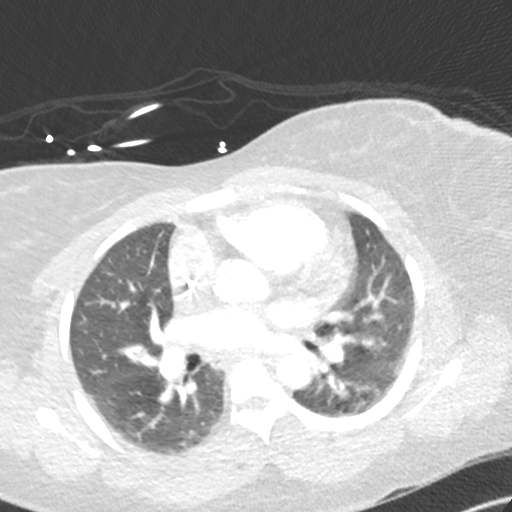
[im 185/303  soft-tissue]
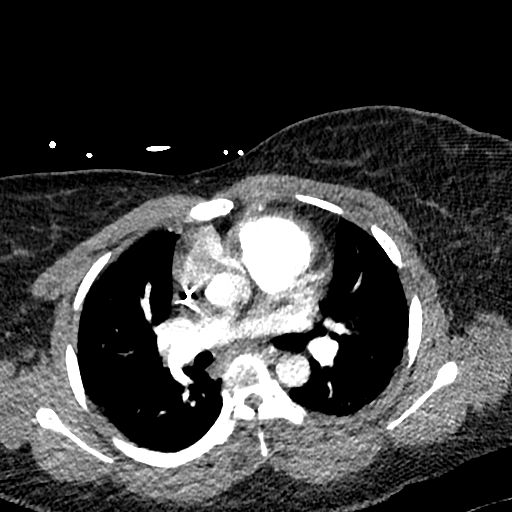
[im 202/303  lung]
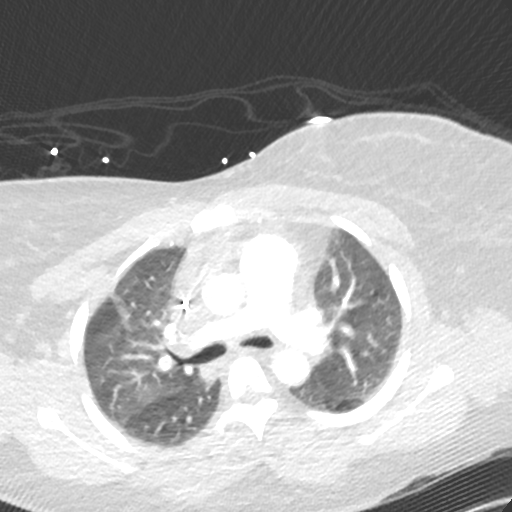
[im 235/303  soft-tissue]
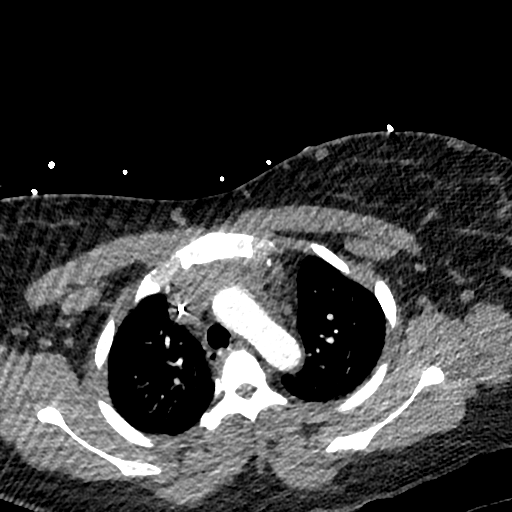
[im 252/303  lung]
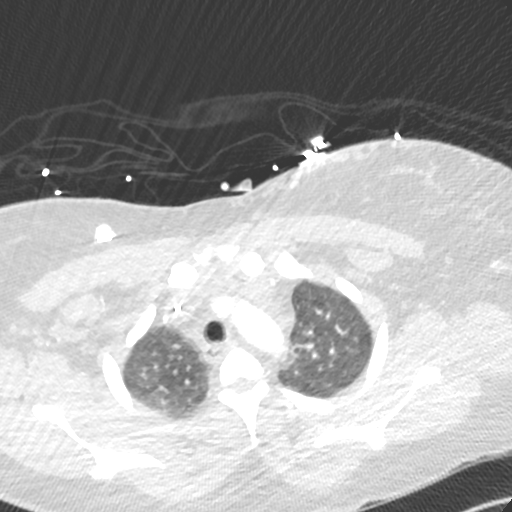
[im 269/303  soft-tissue]
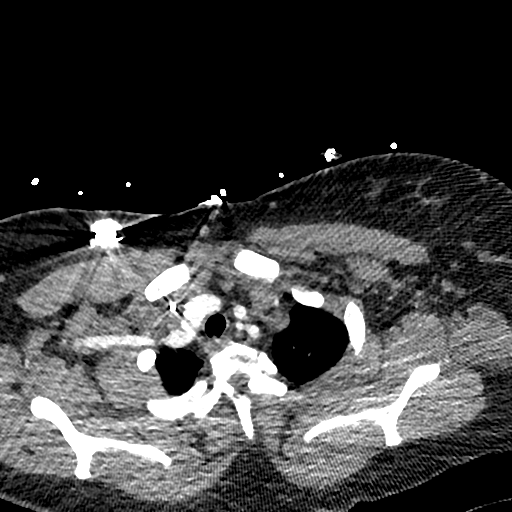
[im 286/303  lung]
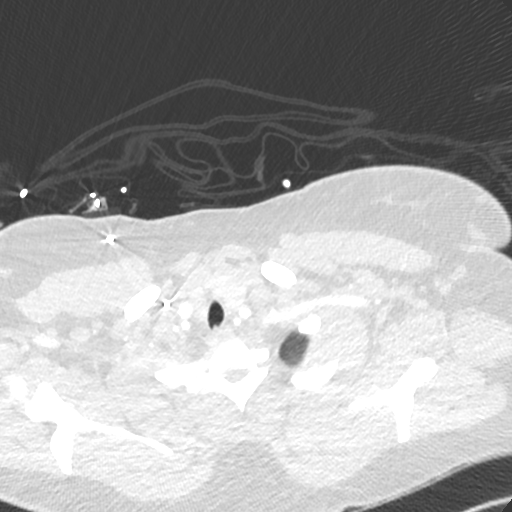

[Series 8: cor · coronal · 0.43mm/px · 3 of 125 slices shown]
[im 32/125  soft-tissue]
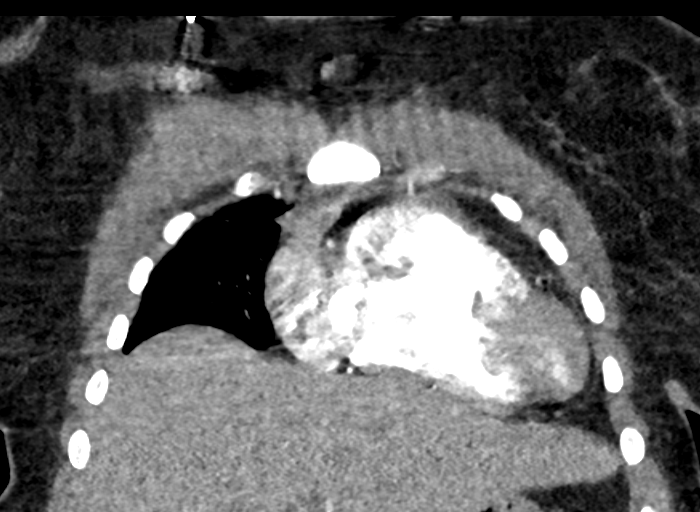
[im 63/125  soft-tissue]
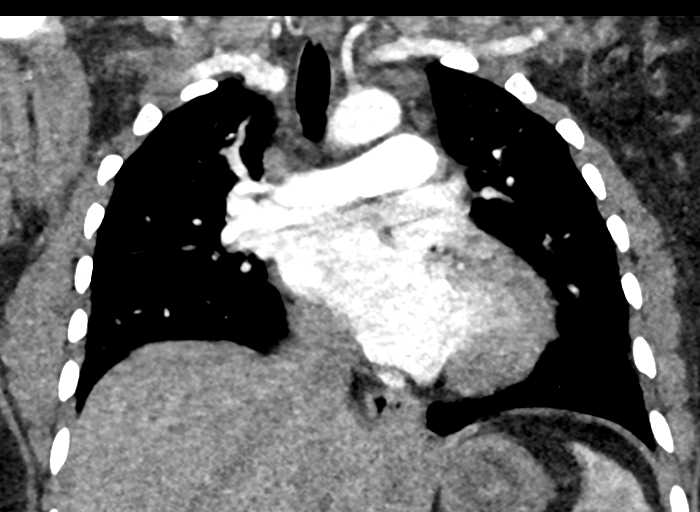
[im 94/125  soft-tissue]
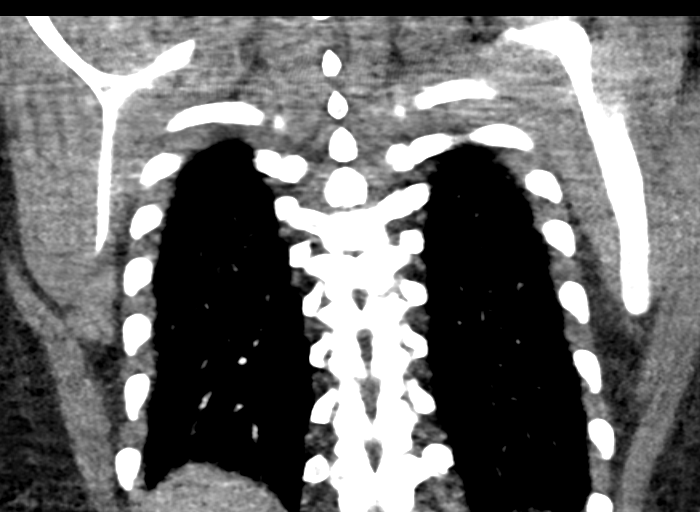

[18 of 46 positions shown; findings below may reference images not displayed]

FINDINGS: Cardiovascular: Satisfactory opacification of the pulmonary arteries
to the segmental level. No evidence of pulmonary embolism. Mild
cardiac enlargement. No pericardial effusion. Normal caliber
thoracic aorta. No evidence of aortic dissection. Great vessel
origins are patent.

Mediastinum/Nodes: Esophagus is decompressed. No significant
lymphadenopathy in the chest.

Lungs/Pleura: Motion artifact limits evaluation. No focal
consolidation. Parenchymal pattern demonstrates a somewhat mosaic
appearance. This could just be due to motion artifact or may
indicate edema. No pleural effusions. No pneumothorax.

Upper Abdomen: Mild heterogeneous appearance of the spleen with
somewhat increased density. This is likely related to sickle cell
changes.

Musculoskeletal: Diffuse bone sclerosis with endplate changes in the
thoracic spine consistent with changes of sickle cell.

Review of the MIP images confirms the above findings.
IMPRESSION: 1. No evidence of significant pulmonary embolus.
2. Mosaic pattern to the lung parenchyma may be due to motion
artifact or could indicate edema.
3. Sickle cell changes demonstrated in the bones and spleen.

## 2018-01-12 IMAGING — CR DG CHEST 2V
2 series · 2 of 2 positions shown · non-contrast
Comparison: Chest radiograph 02/01/2017

CLINICAL DATA: Sickle cell disease and chest pain

EXAM:
CHEST  2 VIEW

[chest lat]
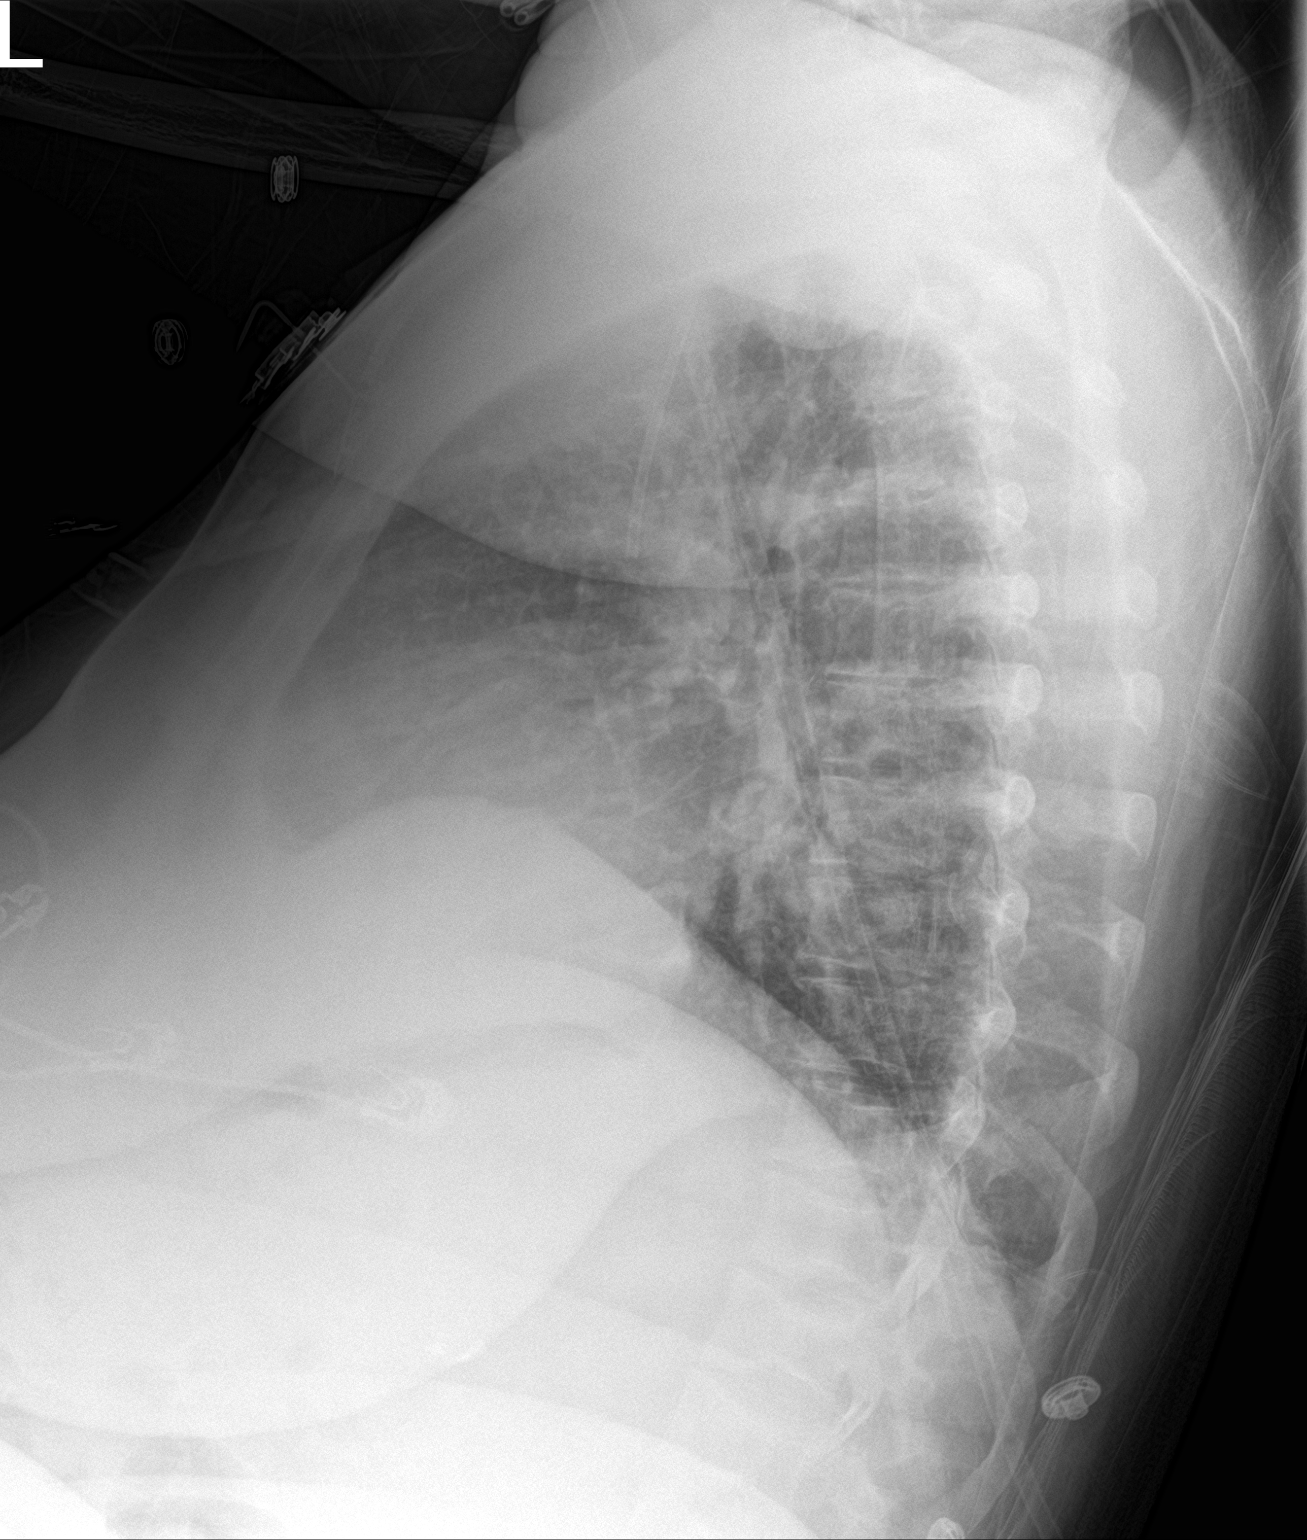

[chest ap]
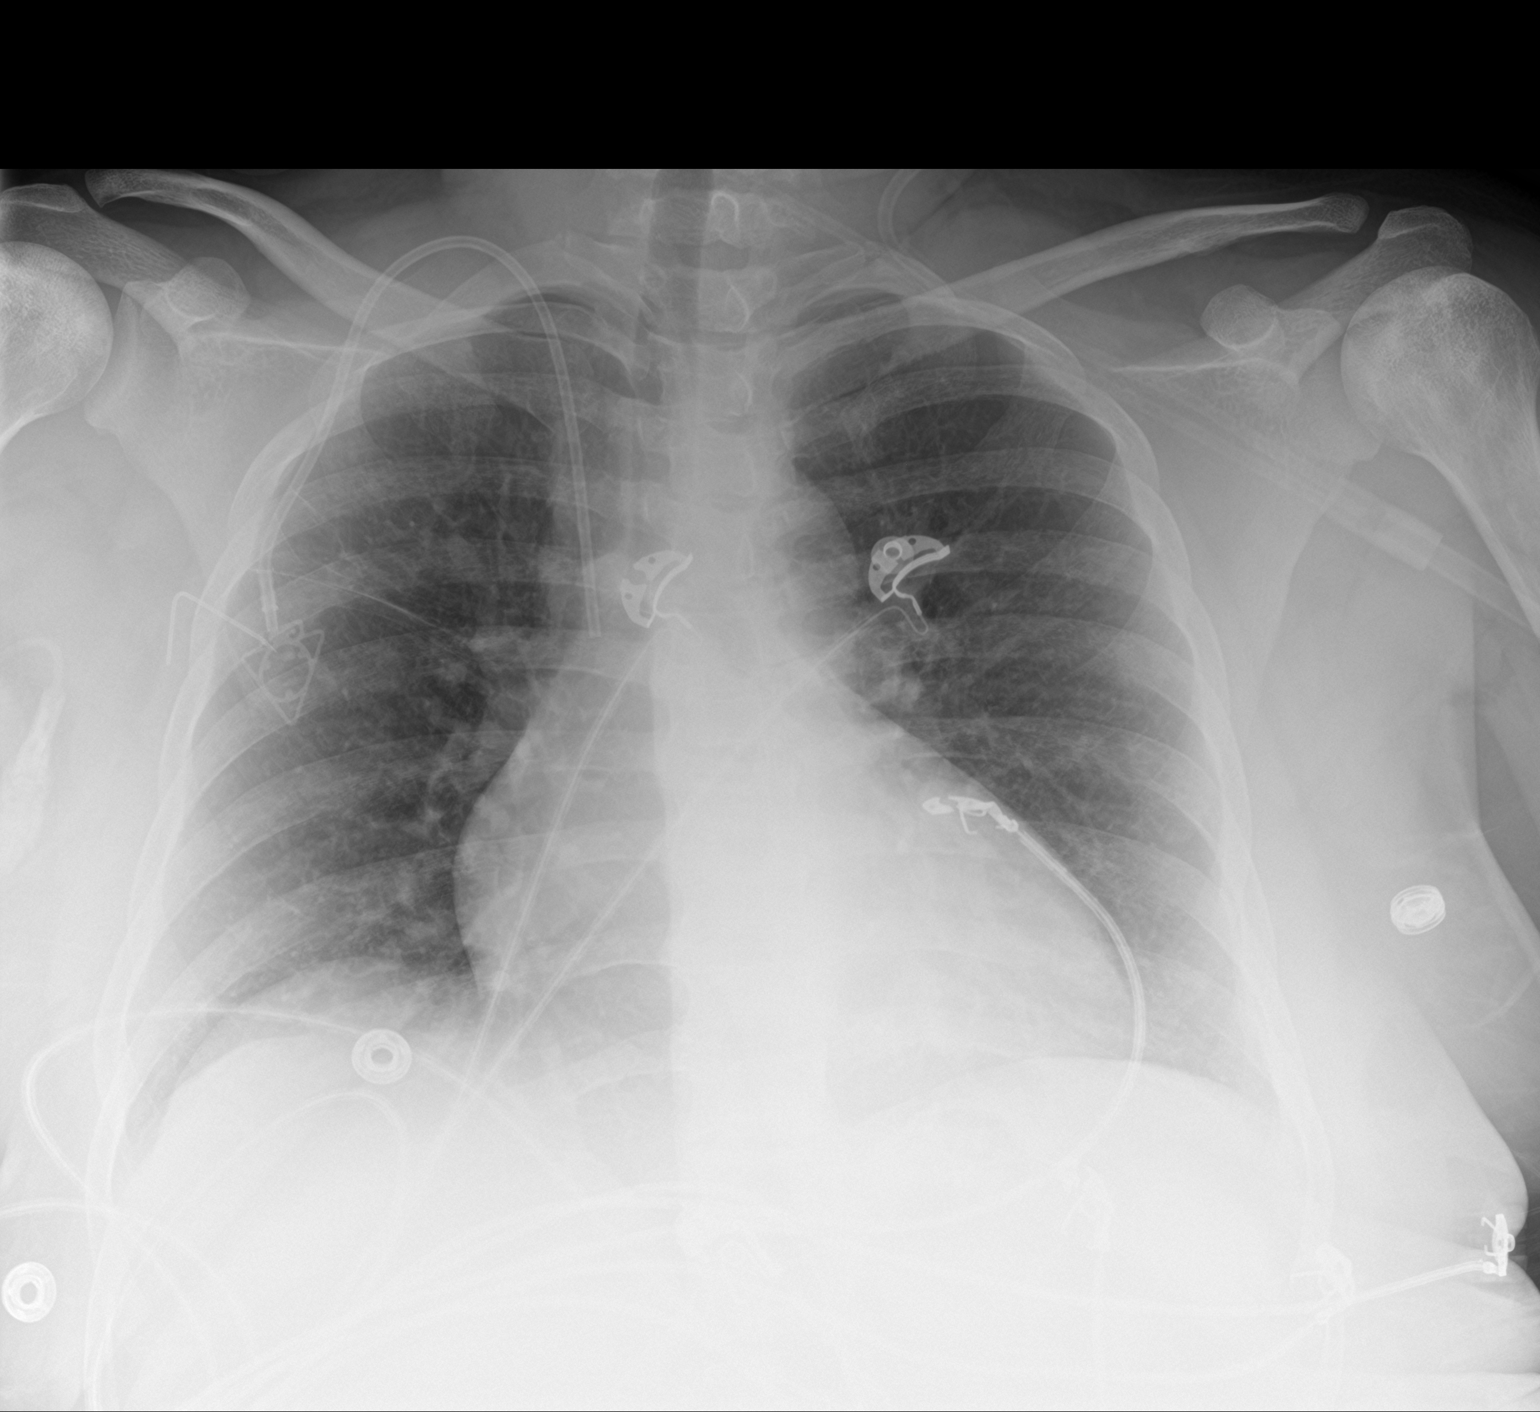

[2 of 2 positions shown; findings below may reference images not displayed]

FINDINGS: Accessed right chest wall Port-A-Cath tip is in the lower SVC via a
right internal jugular vein approach. Cardiomegaly is unchanged. No
focal airspace consolidation or pulmonary edema. No pleural effusion
or pneumothorax. Typical H-shaped vertebral bodies of sickle cell
disease.
IMPRESSION: No active cardiopulmonary disease.  Unchanged cardiomegaly.

## 2018-01-15 IMAGING — DX DG LUMBAR SPINE 2-3V
3 series · 3 of 3 positions shown · non-contrast
Comparison: 06/10/2016

CLINICAL DATA: Severe low back pain and left leg pain

EXAM:
LUMBAR SPINE - 2-3 VIEW

[l-spine ap]
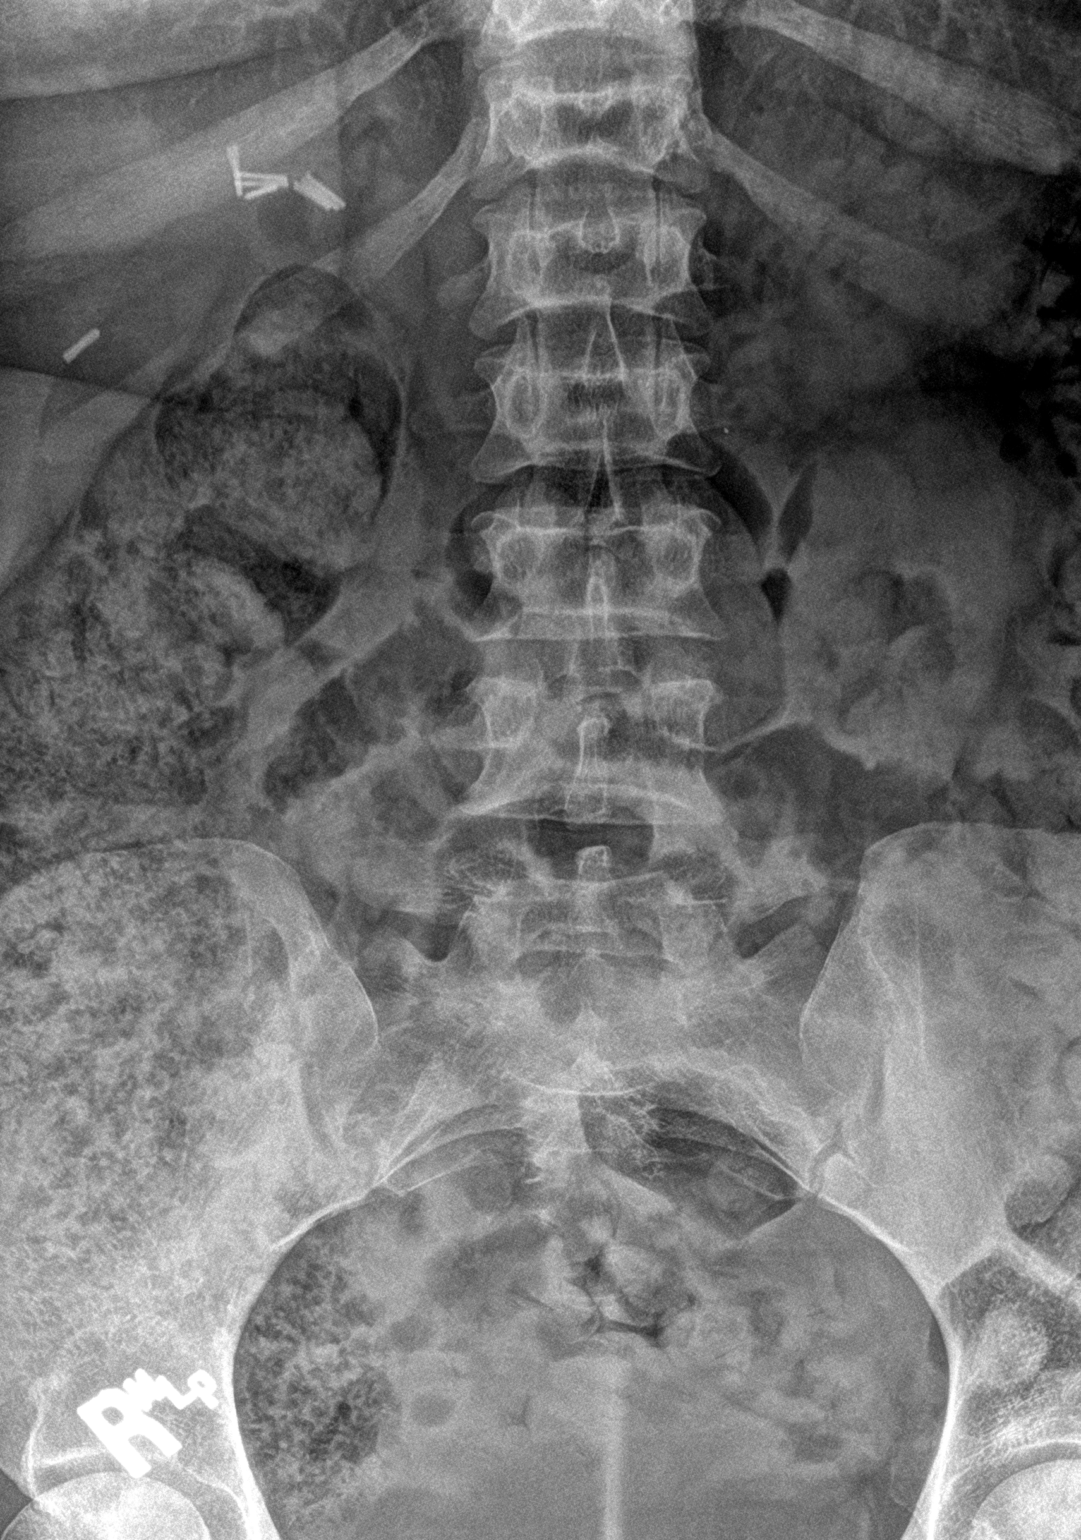

[l-spine lat]
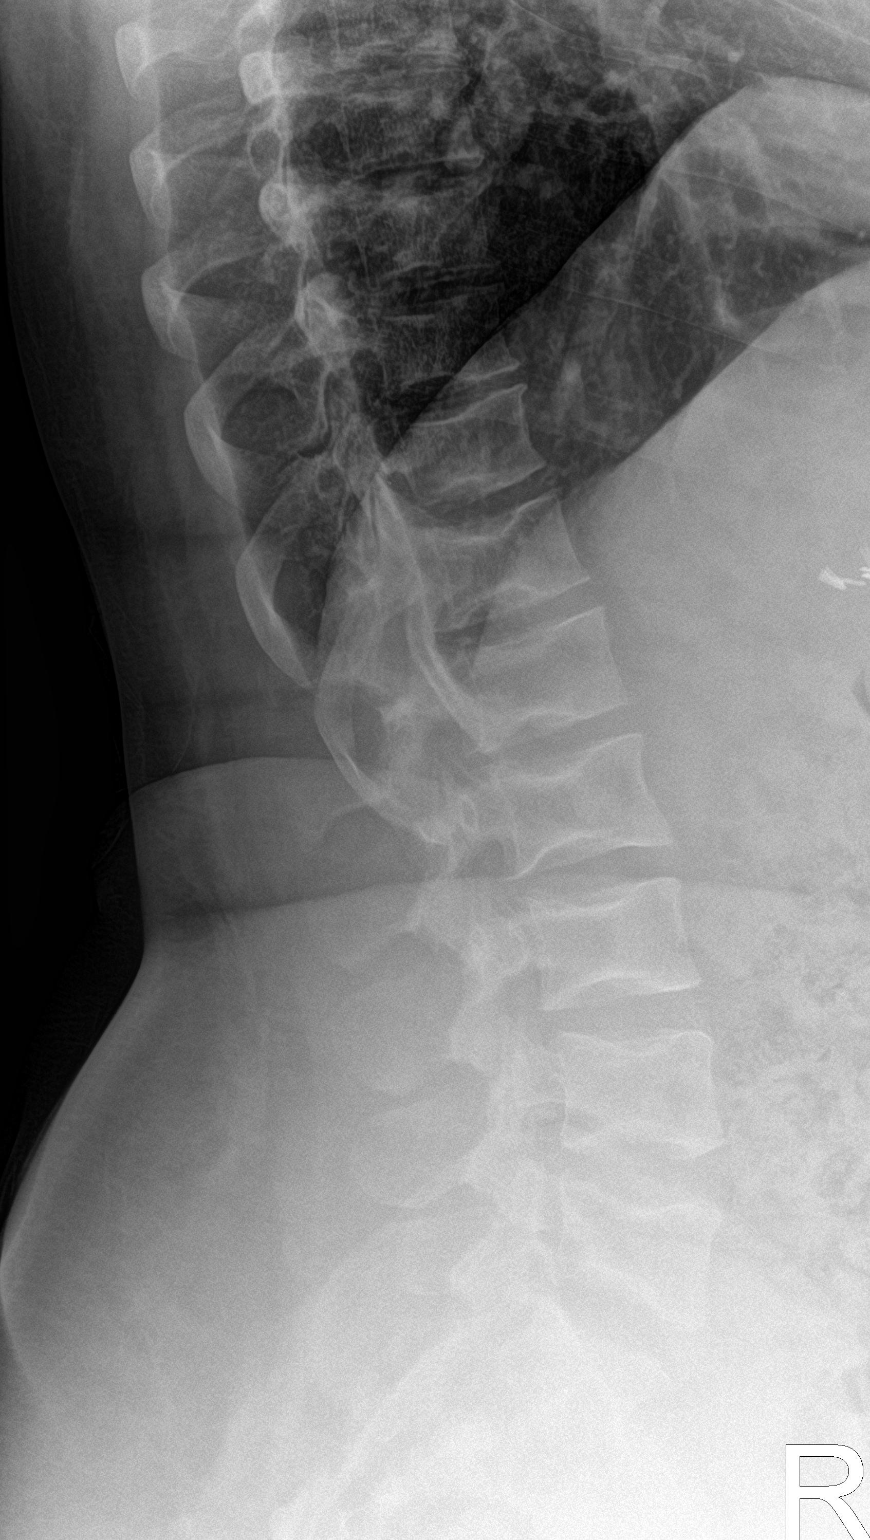

[l-spine spot]
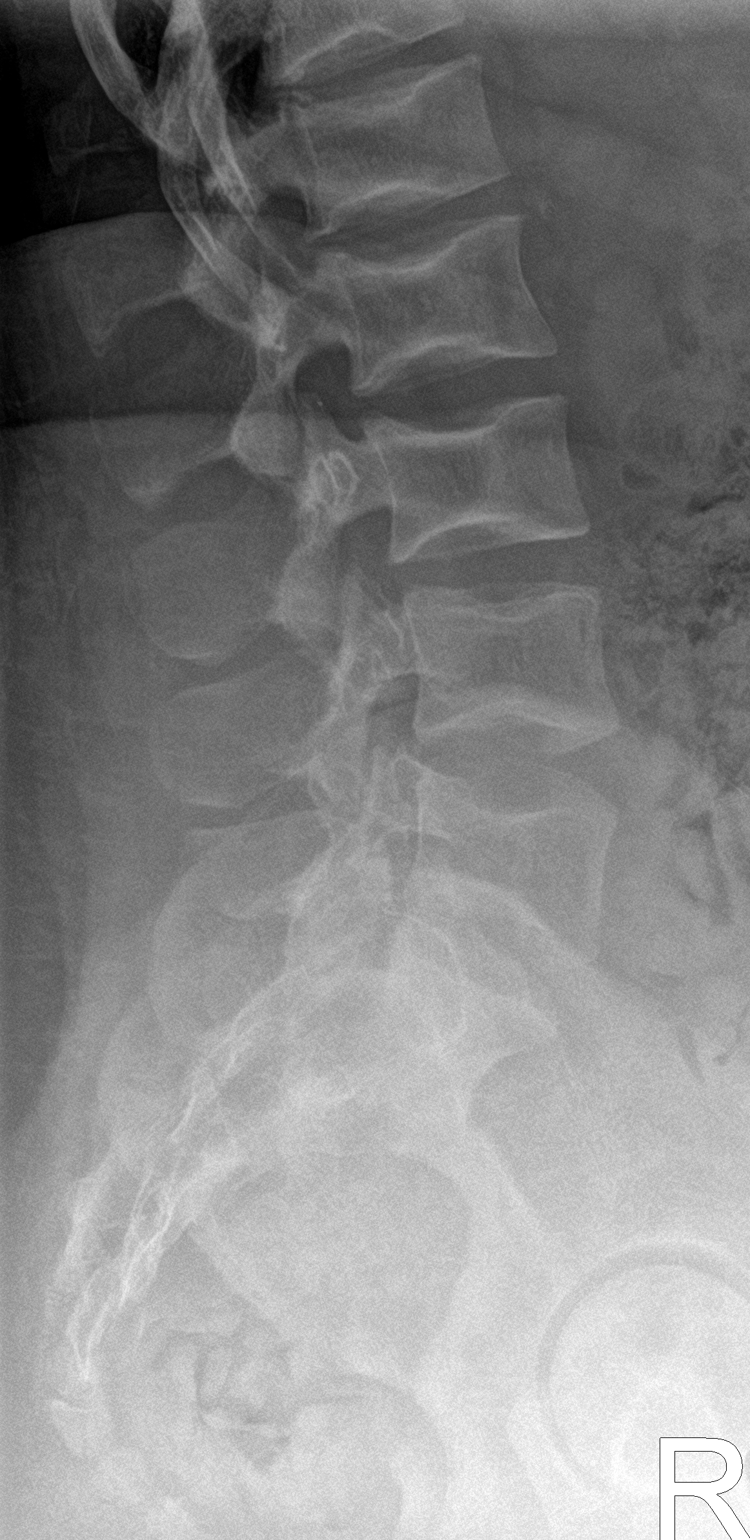

[3 of 3 positions shown; findings below may reference images not displayed]

FINDINGS: There is no evidence of lumbar spine fracture. Alignment is normal.
H-shaped vertebral bodies on the lateral view which can be seen in
sickle-cell. No pars defects. No spondylolisthesis. An abundant
amount fecal retention is seen within the adjacent colon.
IMPRESSION: 1. H-shaped vertebral bodies consistent with sickle-cell.
2. No acute osseous appearing abnormality.
3. Increased fecal retention within the colon consistent with
constipation.

## 2018-04-11 IMAGING — CR DG CHEST 2V
2 series · 2 of 2 positions shown · non-contrast
Comparison: 02/27/2017

CLINICAL DATA: Chest pain

EXAM:
CHEST  2 VIEW

[w chest pa]
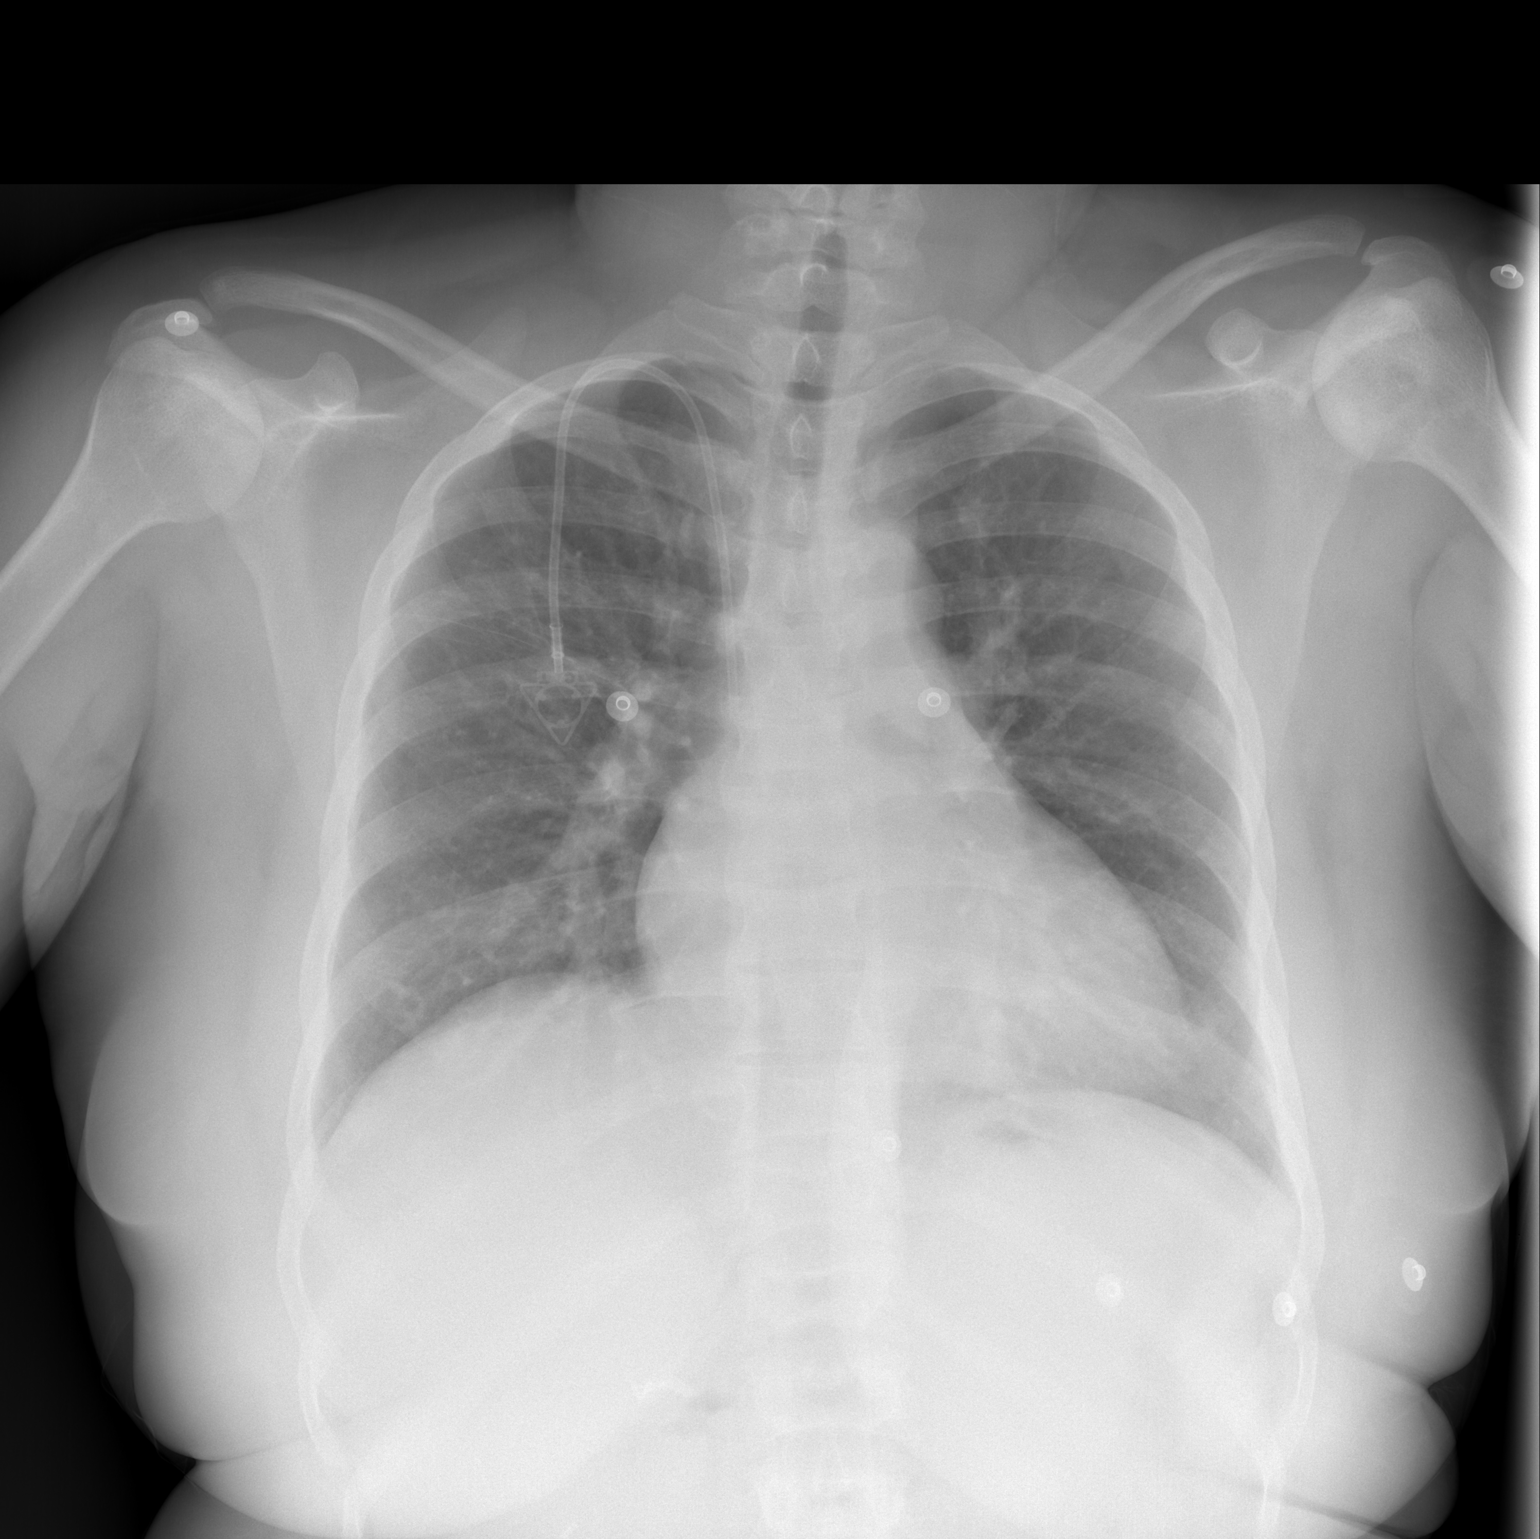

[w chest lat]
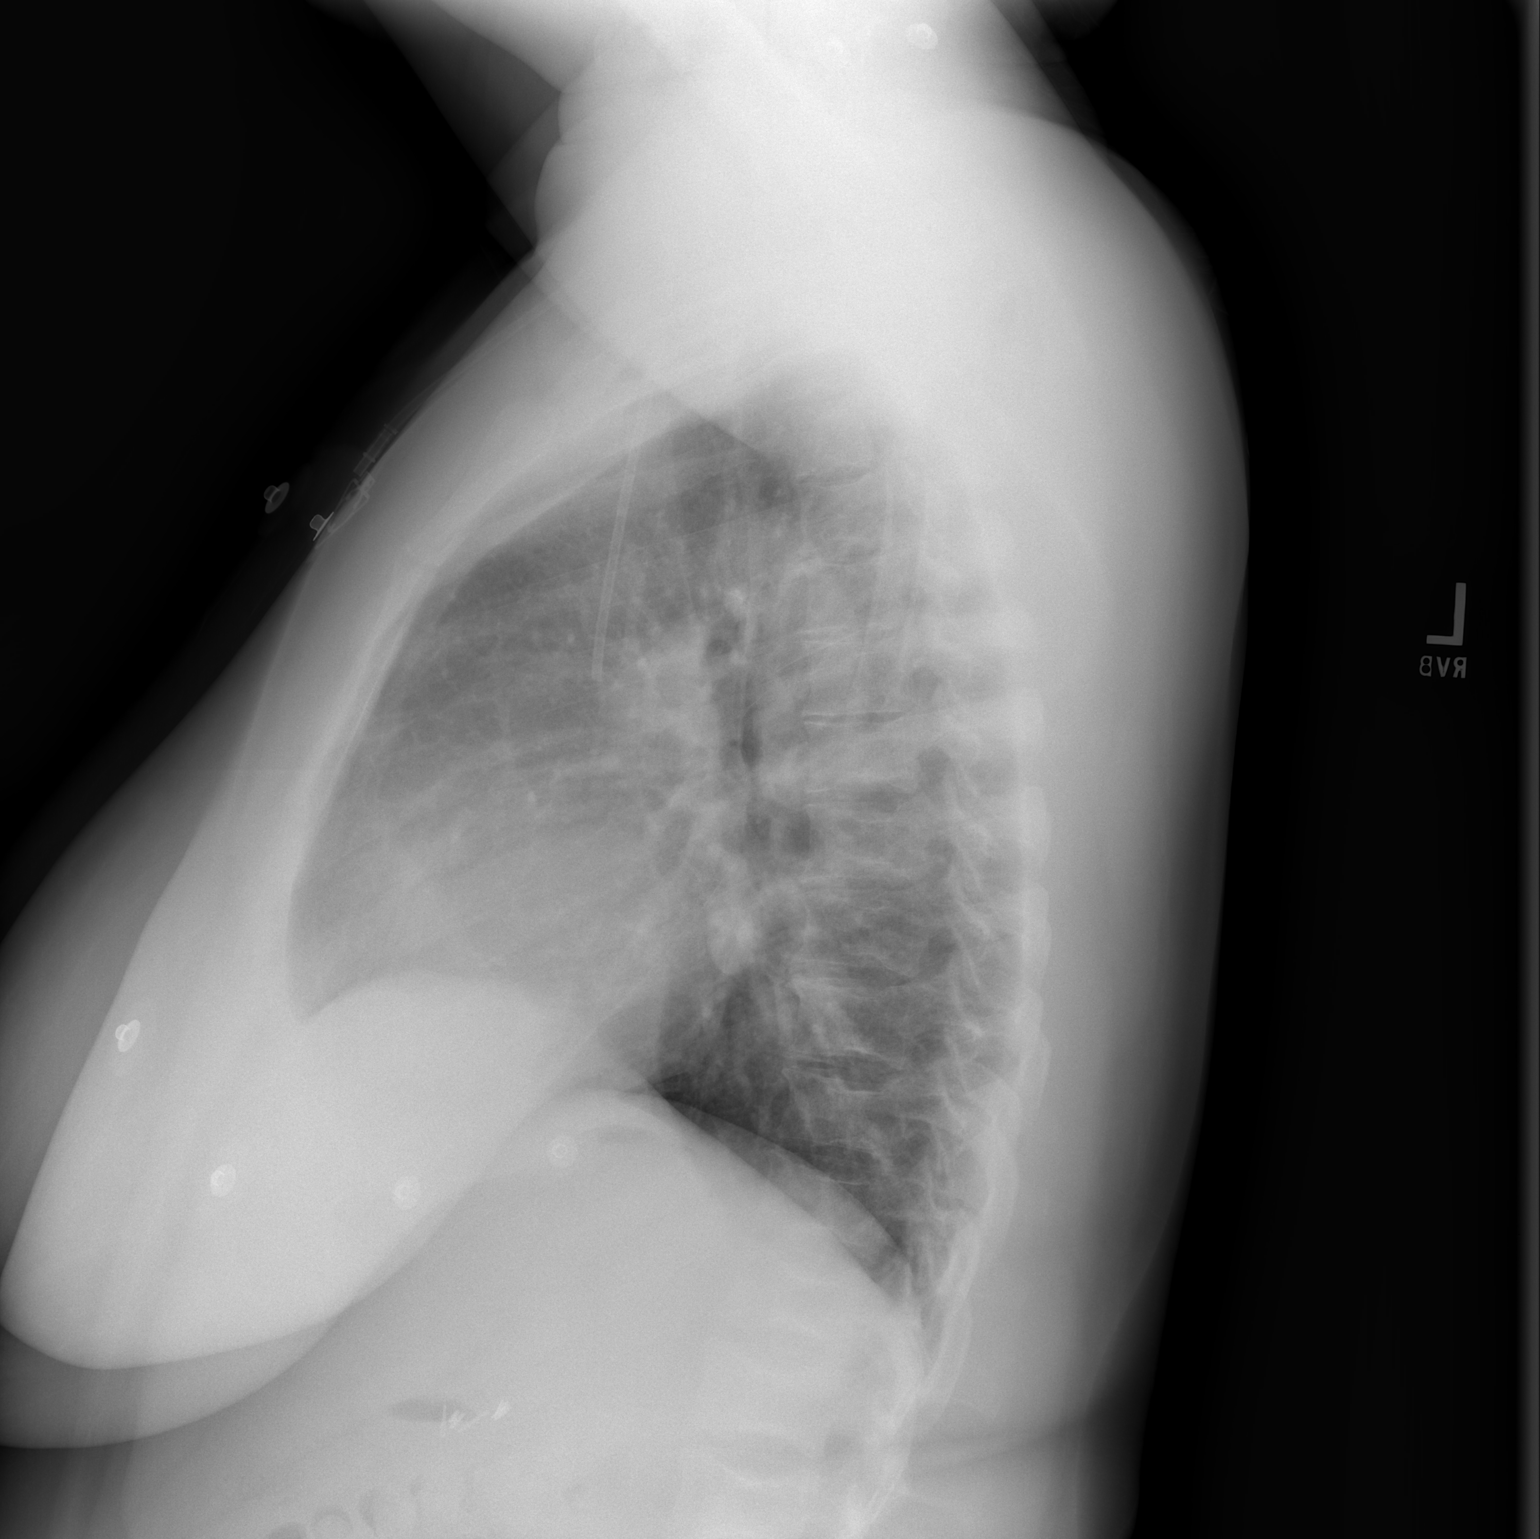

[2 of 2 positions shown; findings below may reference images not displayed]

FINDINGS: Right Port-A-Cath in place. Heart is mildly enlarged. No confluent
opacities, effusions or edema.
IMPRESSION: Mild cardiomegaly.  No active disease.

## 2018-04-13 IMAGING — DX DG CHEST 1V PORT
1 series · 1 of 1 positions shown · non-contrast
Comparison: 05/27/2017 chest radiograph.

CLINICAL DATA: Fever

EXAM:
PORTABLE CHEST 1 VIEW

[chest ap]
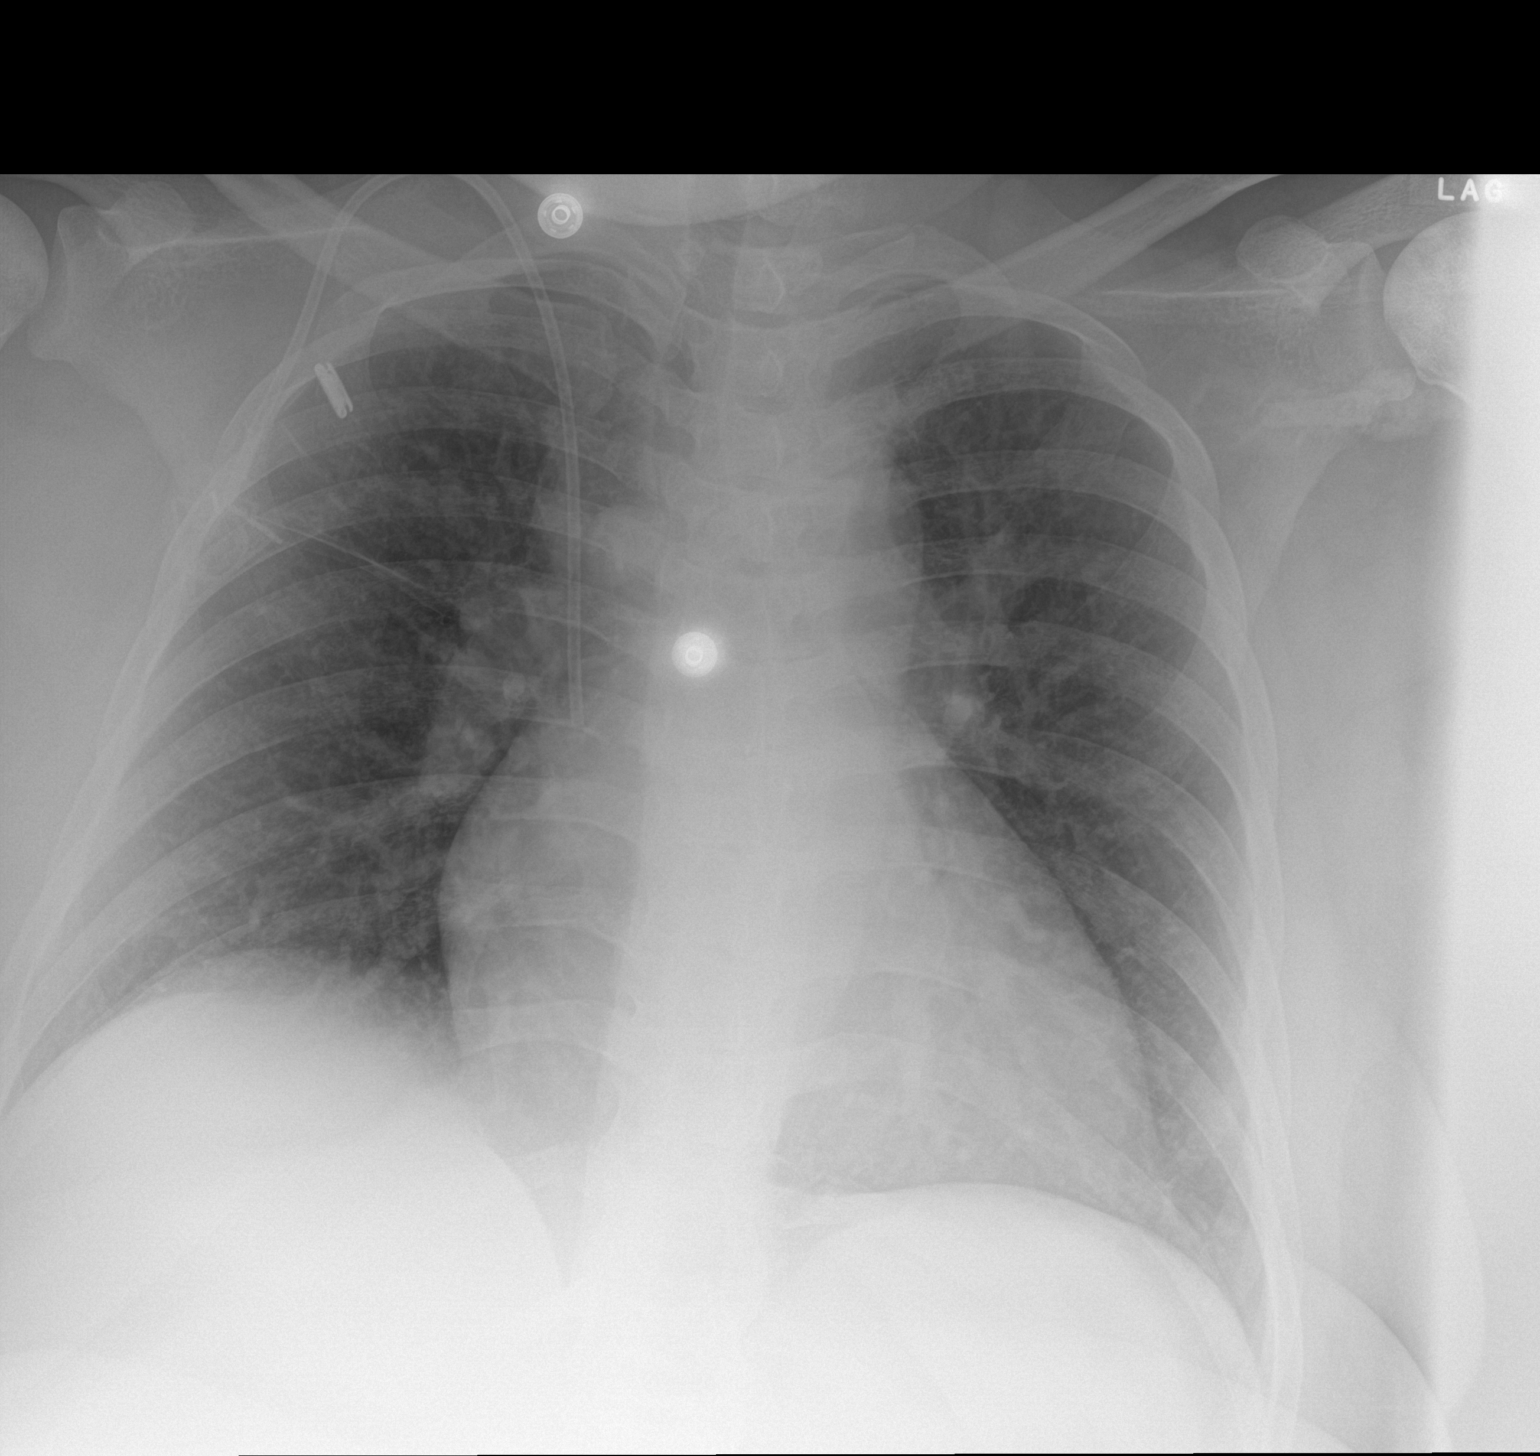

[1 of 1 positions shown; findings below may reference images not displayed]

FINDINGS: Right internal jugular MediPort terminates in the lower third of the
superior vena cava. Stable cardiomediastinal silhouette with
top-normal heart size. No pneumothorax. No pleural effusion.
Cephalization of the pulmonary vasculature without overt pulmonary
edema. No acute consolidative airspace disease.
IMPRESSION: Top-normal heart size and cephalized pulmonary vasculature, without
overt pulmonary edema. No acute consolidative airspace disease to
suggest a pneumonia.

## 2018-05-19 IMAGING — CT CT ABDOMEN W/ CM
3 of 5 series · 16 of 46 positions shown, 18 images · IV contrast (APPLIED)
Comparison: CT 02/27/2017, 02/26/2016, 06/22/2012

CLINICAL DATA: Right upper quadrant pain history of sickle cell

EXAM:
CT ABDOMEN WITH CONTRAST
TECHNIQUE: Multidetector CT imaging of the abdomen was performed using the
standard protocol following bolus administration of intravenous
contrast.
CONTRAST:  100mL 1VHQHM-PLL IOPAMIDOL (1VHQHM-PLL) INJECTION 61%

[Series 2: axial st · axial · 0.98mm/px · z∈[+1059,+1289]mm · 11 of 56 slices shown, 13 images]
[im 5/56  soft-tissue]
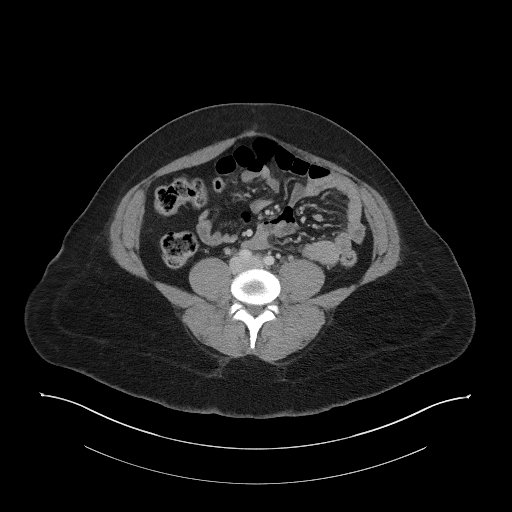
[im 5/56  bone]
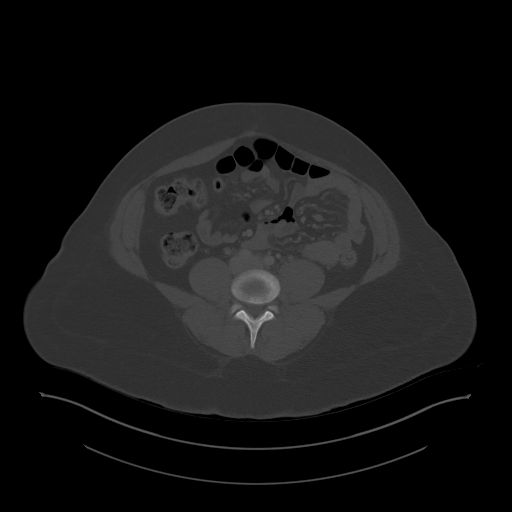
[im 10/56  soft-tissue]
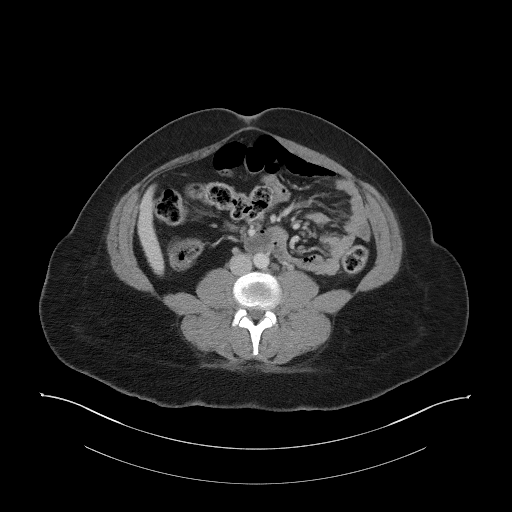
[im 14/56  soft-tissue]
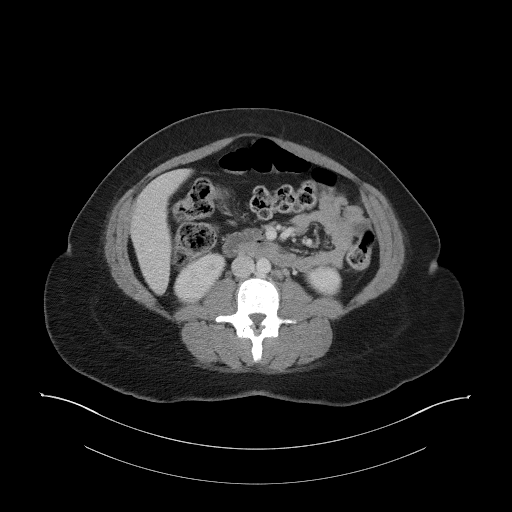
[im 19/56  soft-tissue]
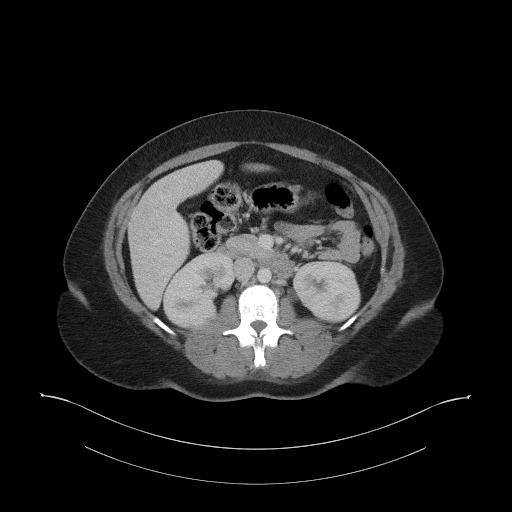
[im 23/56  soft-tissue]
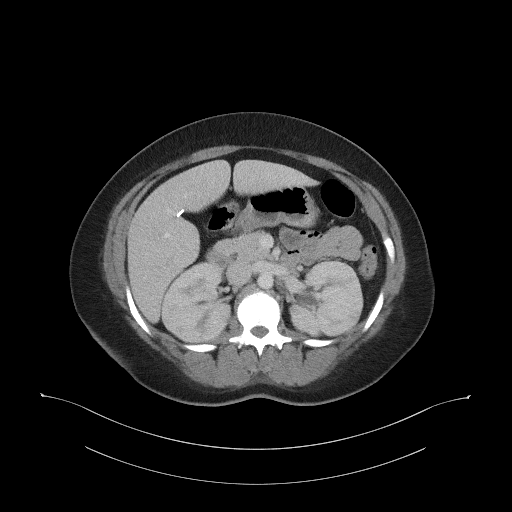
[im 28/56  soft-tissue]
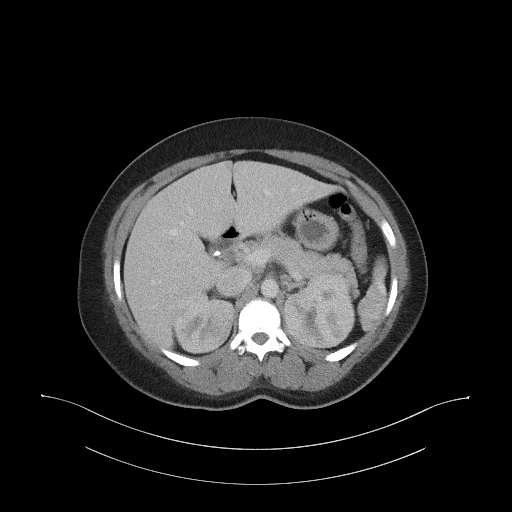
[im 33/56  soft-tissue]
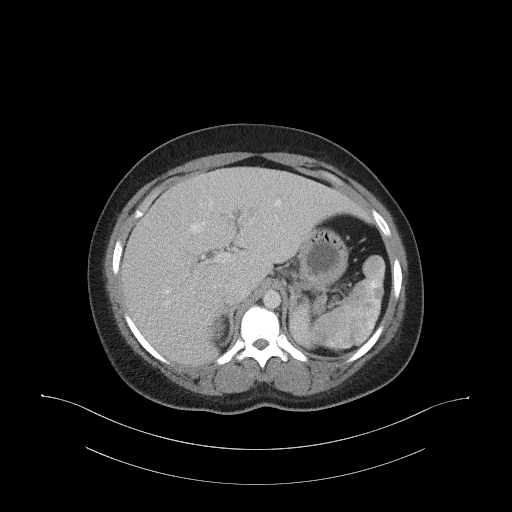
[im 37/56  soft-tissue]
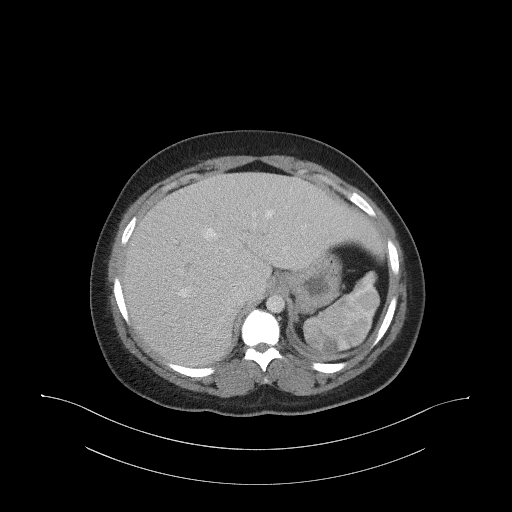
[im 42/56  soft-tissue]
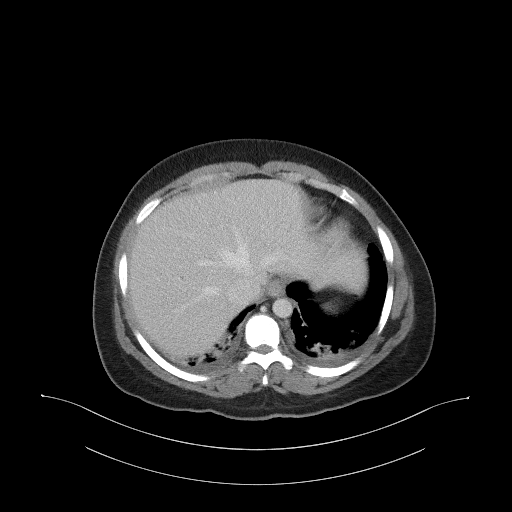
[im 42/56  bone]
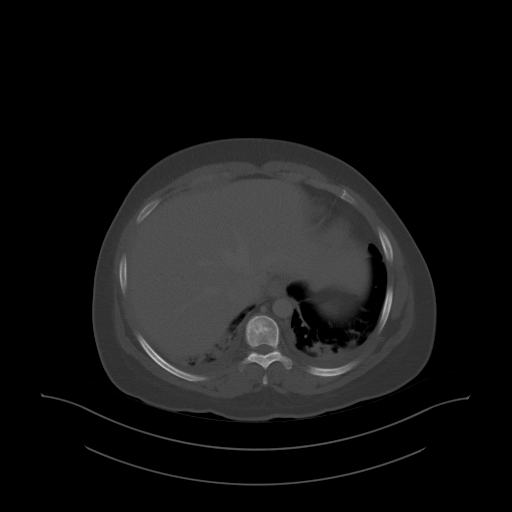
[im 46/56  soft-tissue]
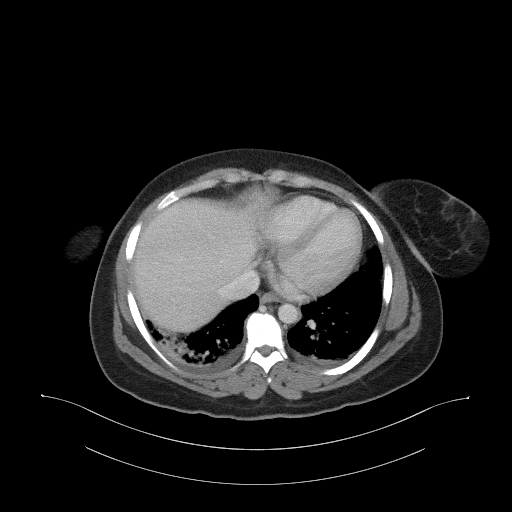
[im 51/56  soft-tissue]
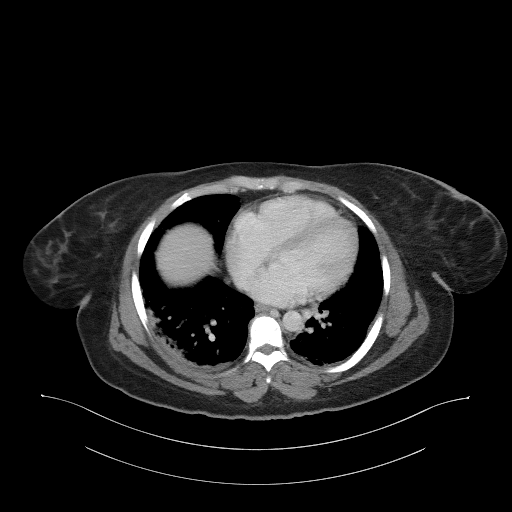

[Series 4: lung bases · axial · 0.98mm/px · z∈[+1218,+1228]mm · 2 of 53 slices shown]
[im 5/53  bone]
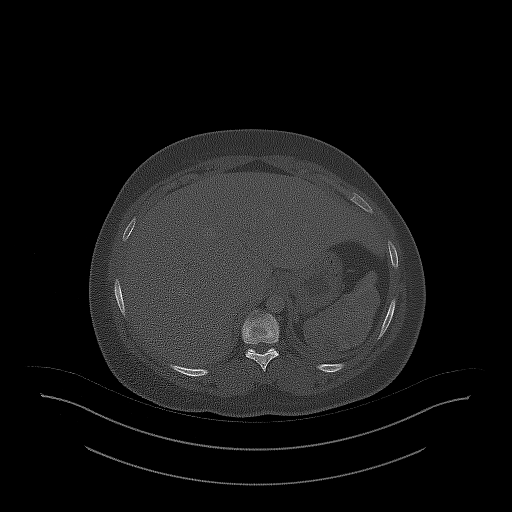
[im 10/53  bone]
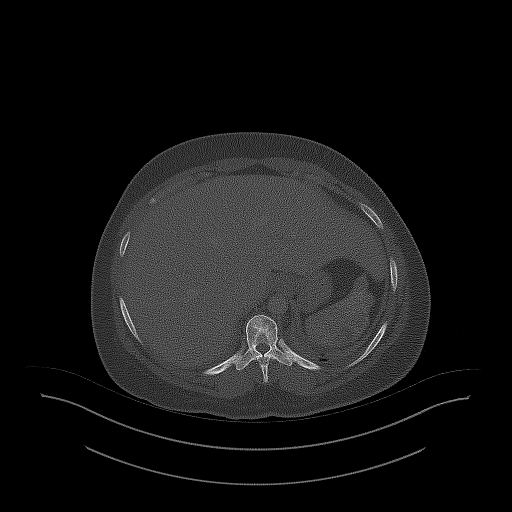

[Series 5: coronal st · coronal · 0.56mm/px · 3 of 103 slices shown]
[im 35/103  soft-tissue]
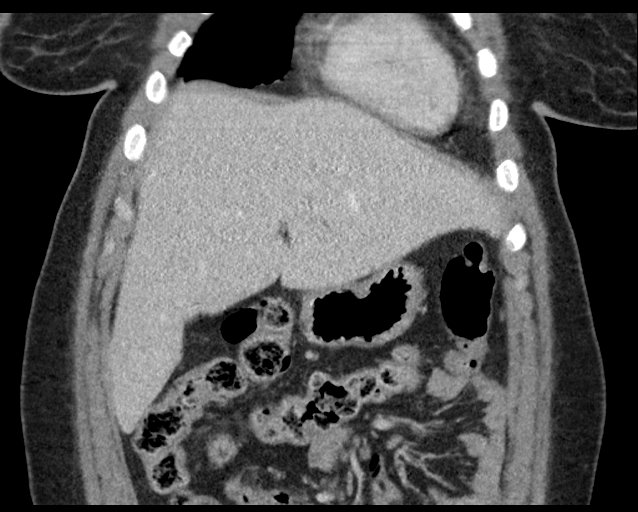
[im 46/103  soft-tissue]
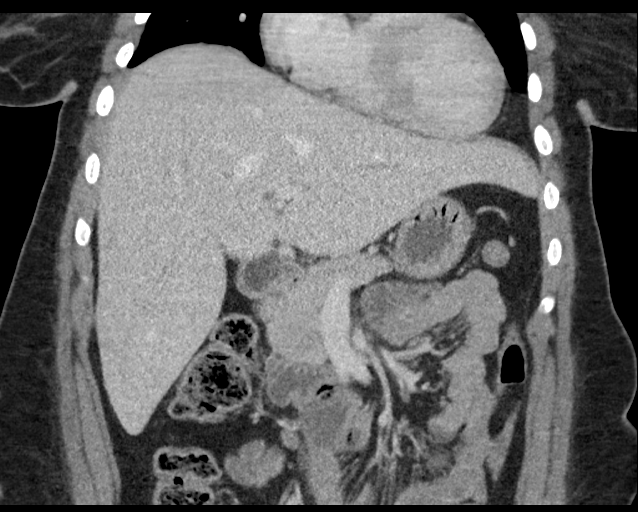
[im 57/103  soft-tissue]
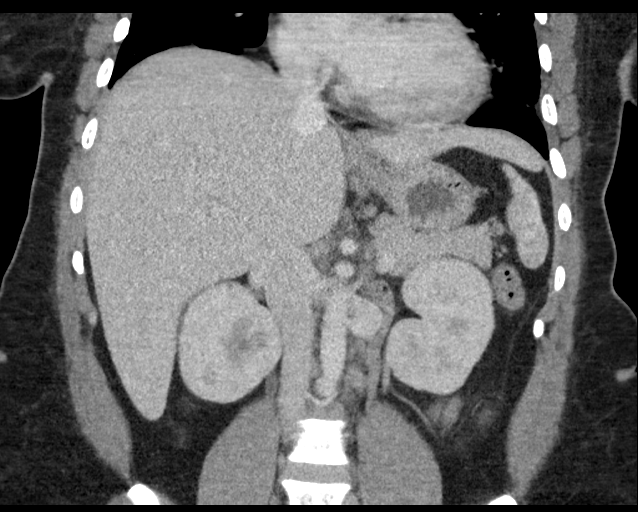

[16 of 46 positions shown; findings below may reference images not displayed]

FINDINGS: Lower chest: Lung bases demonstrate small pleural effusions. Patchy
dependent consolidations and ground-glass densities. Mild
cardiomegaly.

Hepatobiliary: Liver is enlarged, with craniocaudad measurement of
22 cm. No focal hepatic abnormality is seen. Surgical clips at the
gallbladder fossa. No biliary dilatation.

Pancreas: Unremarkable. No pancreatic ductal dilatation or
surrounding inflammatory changes.

Spleen: Heterogeneous with multiple hypodense nodules, stable
compared with multiple prior studies

Adrenals/Urinary Tract: Adrenal glands are within normal limits.
Kidneys are unremarkable

Stomach/Bowel: Stomach within normal limits. Visible colon and small
bowel are within normal limits.

Vascular/Lymphatic: Nonaneurysmal upper abdominal aorta. No
significantly enlarged lymph nodes.

Other: Negative for free air or ascites in the upper abdomen

Musculoskeletal: Bony changes at the spine consistent with history
of sickle cell disease.
IMPRESSION: 1. Hepatomegaly without focal hepatic abnormality or biliary
dilatation
2. Status post cholecystectomy
3. Small pleural effusions. Patchy dependent consolidations and
ground-glass densities, favor atelectasis, less likely infection.
4. Stable diffuse nodularity of the spleen

## 2018-05-20 IMAGING — DX DG CHEST 2V
2 series · 2 of 2 positions shown · non-contrast
Comparison: 05/29/2017

CLINICAL DATA: Fever.  Chest pain under both breasts.

EXAM:
CHEST  2 VIEW

[chest pa]
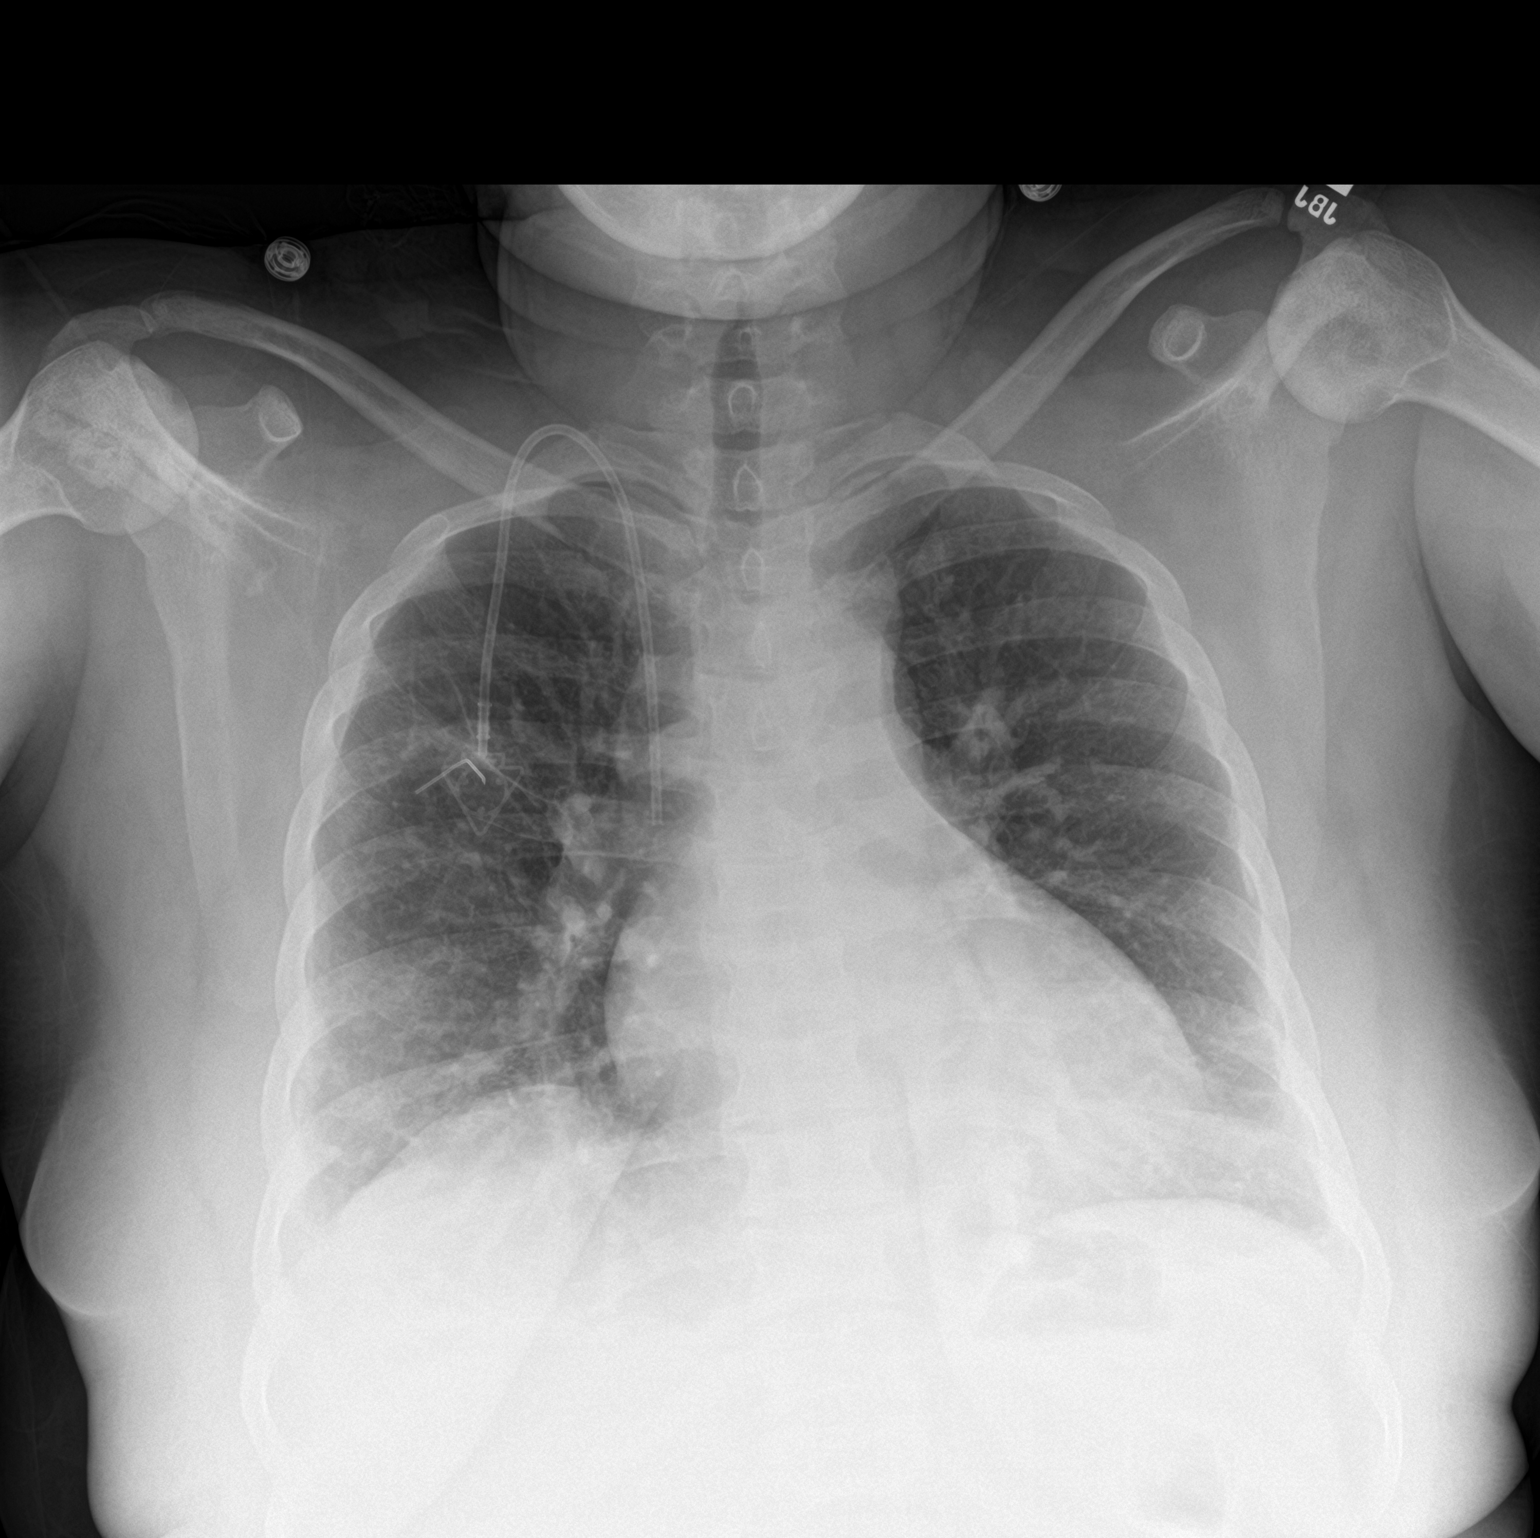

[chest lat]
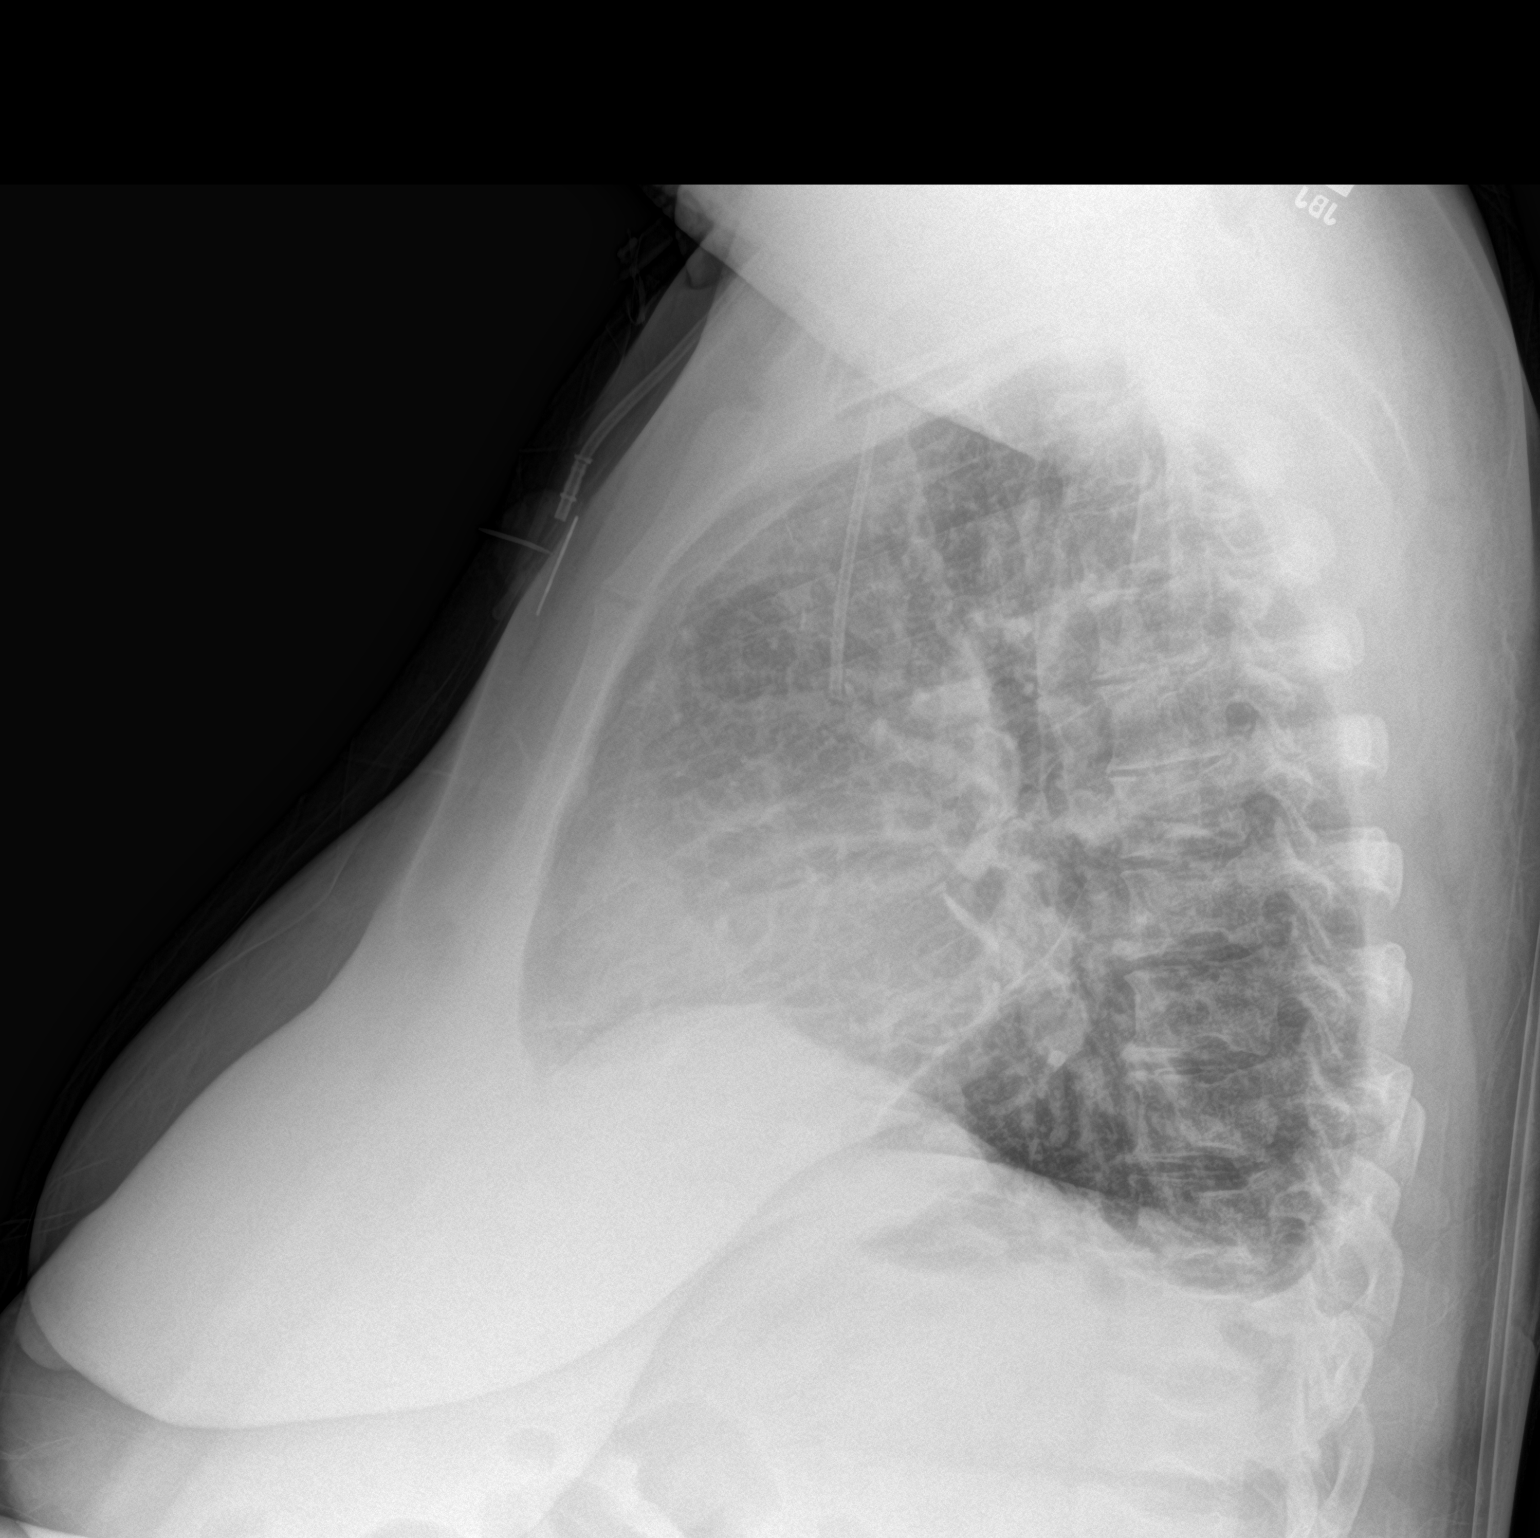

[2 of 2 positions shown; findings below may reference images not displayed]

FINDINGS: Borderline heart size. Stable mediastinal contours. Porta catheter
on the right with tip at the SVC level. There is streaky density at
the bases with trace pleural effusions. Mild fissural thickening and
airway cuffing.
IMPRESSION: 1. Mild lung opacity at the bases with CT appearance yesterday
favoring atelectasis over infection. Need clinical correlation for
acute chest syndrome.
2. Trace pleural effusions and airway cuffing, is there volume
overload in this patient with mild cardiomegaly?

## 2018-05-25 IMAGING — CR DG CHEST 2V
2 series · 2 of 2 positions shown · non-contrast
Comparison: 07/05/2017

CLINICAL DATA: Chest pain.  History of sickle cell disease.

EXAM:
CHEST  2 VIEW

[w chest pa]
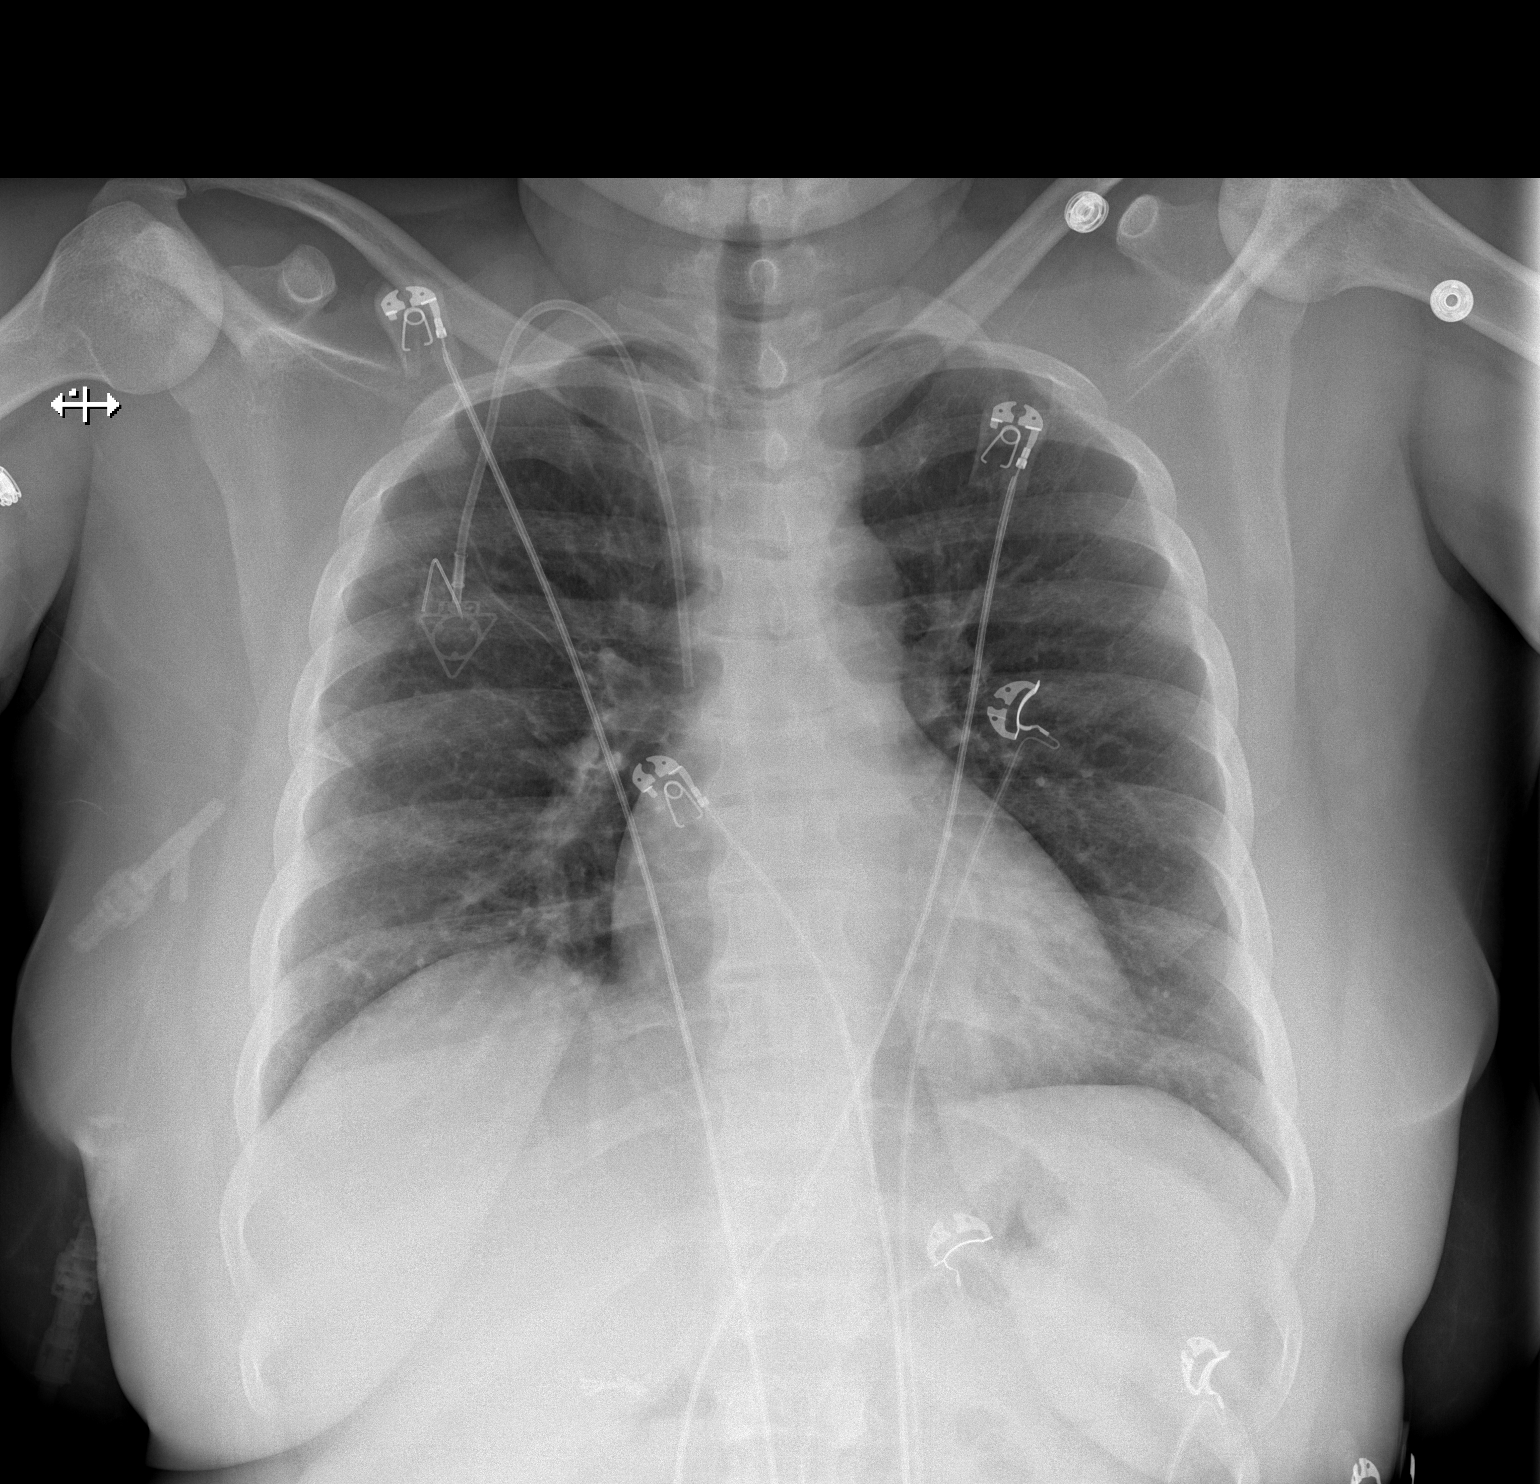

[w chest lat]
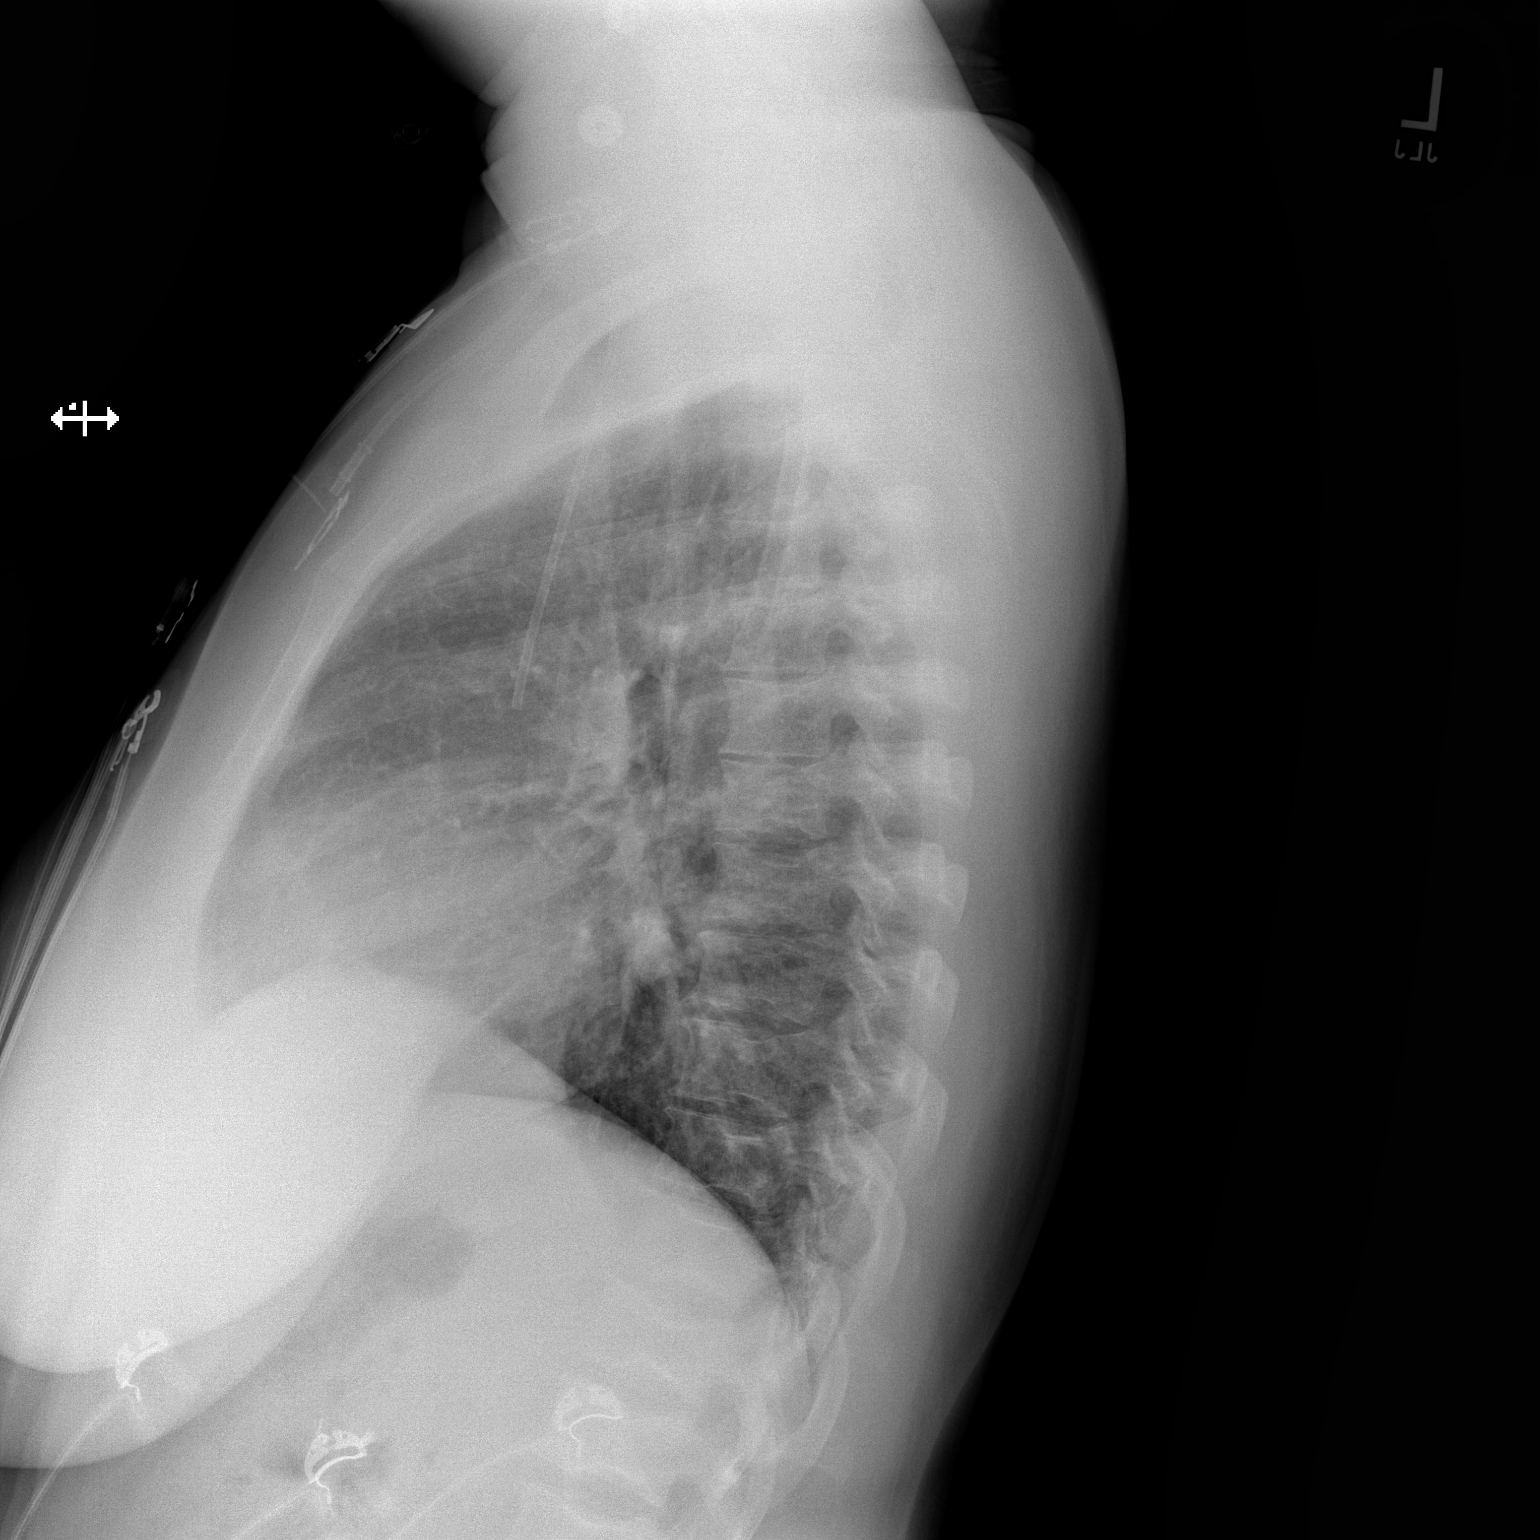

[2 of 2 positions shown; findings below may reference images not displayed]

FINDINGS: Porta catheter in good position with tip at the distal SVC. There is
no edema, consolidation, effusion, or pneumothorax. Normal heart
size and mediastinal contours. Endplate concavity and bilateral
humeral head avascular necrosis.
IMPRESSION: No evidence of active disease. Lower lobe opacities on prior are no
longer seen.

## 2018-08-14 IMAGING — CR DG CHEST 2V
2 series · 2 of 2 positions shown · non-contrast
Comparison: Chest x-ray dated July 10, 2017.

CLINICAL DATA: Chest pain.  History of sickle cell disease.

EXAM:
CHEST - 2 VIEW

[w chest pa]
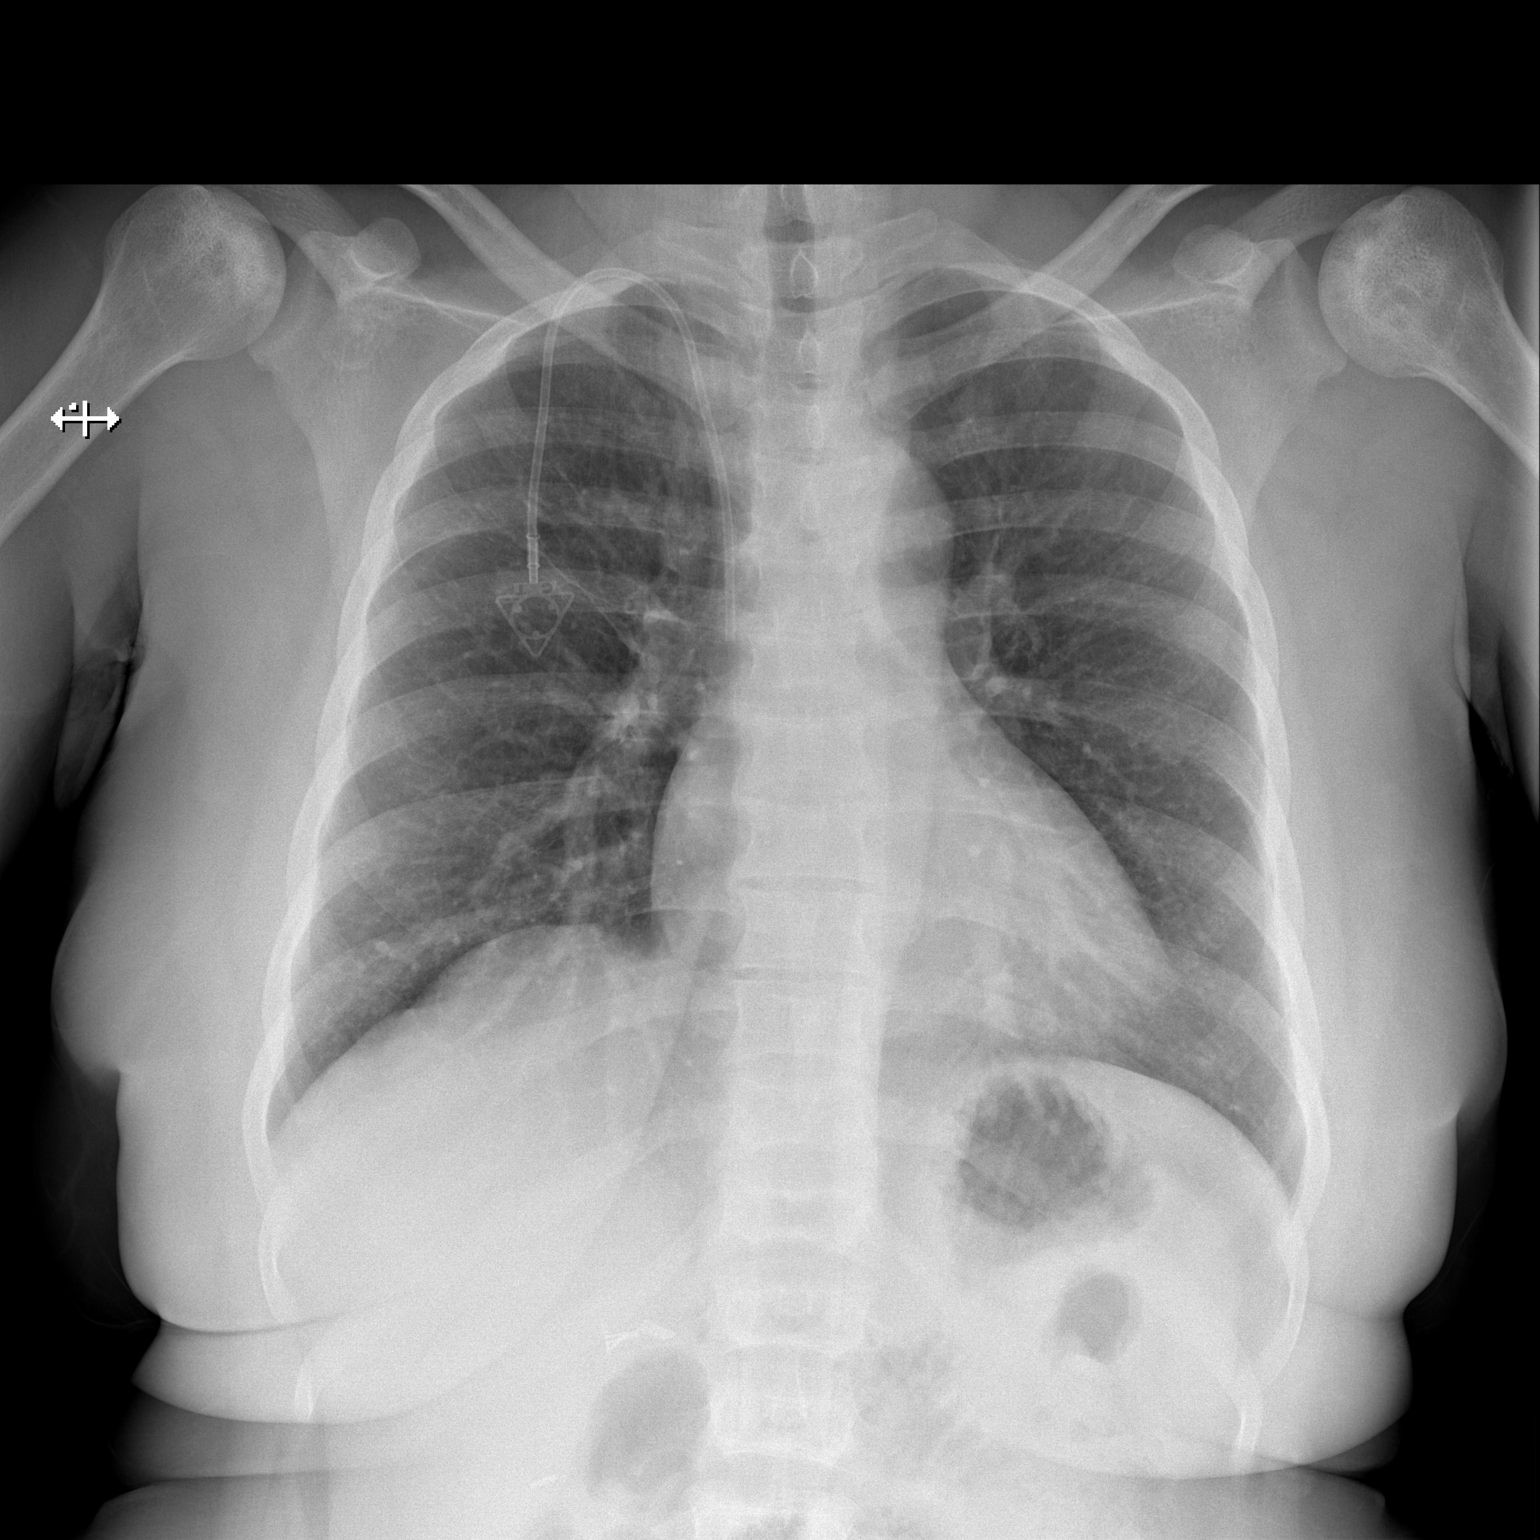

[w chest lat]
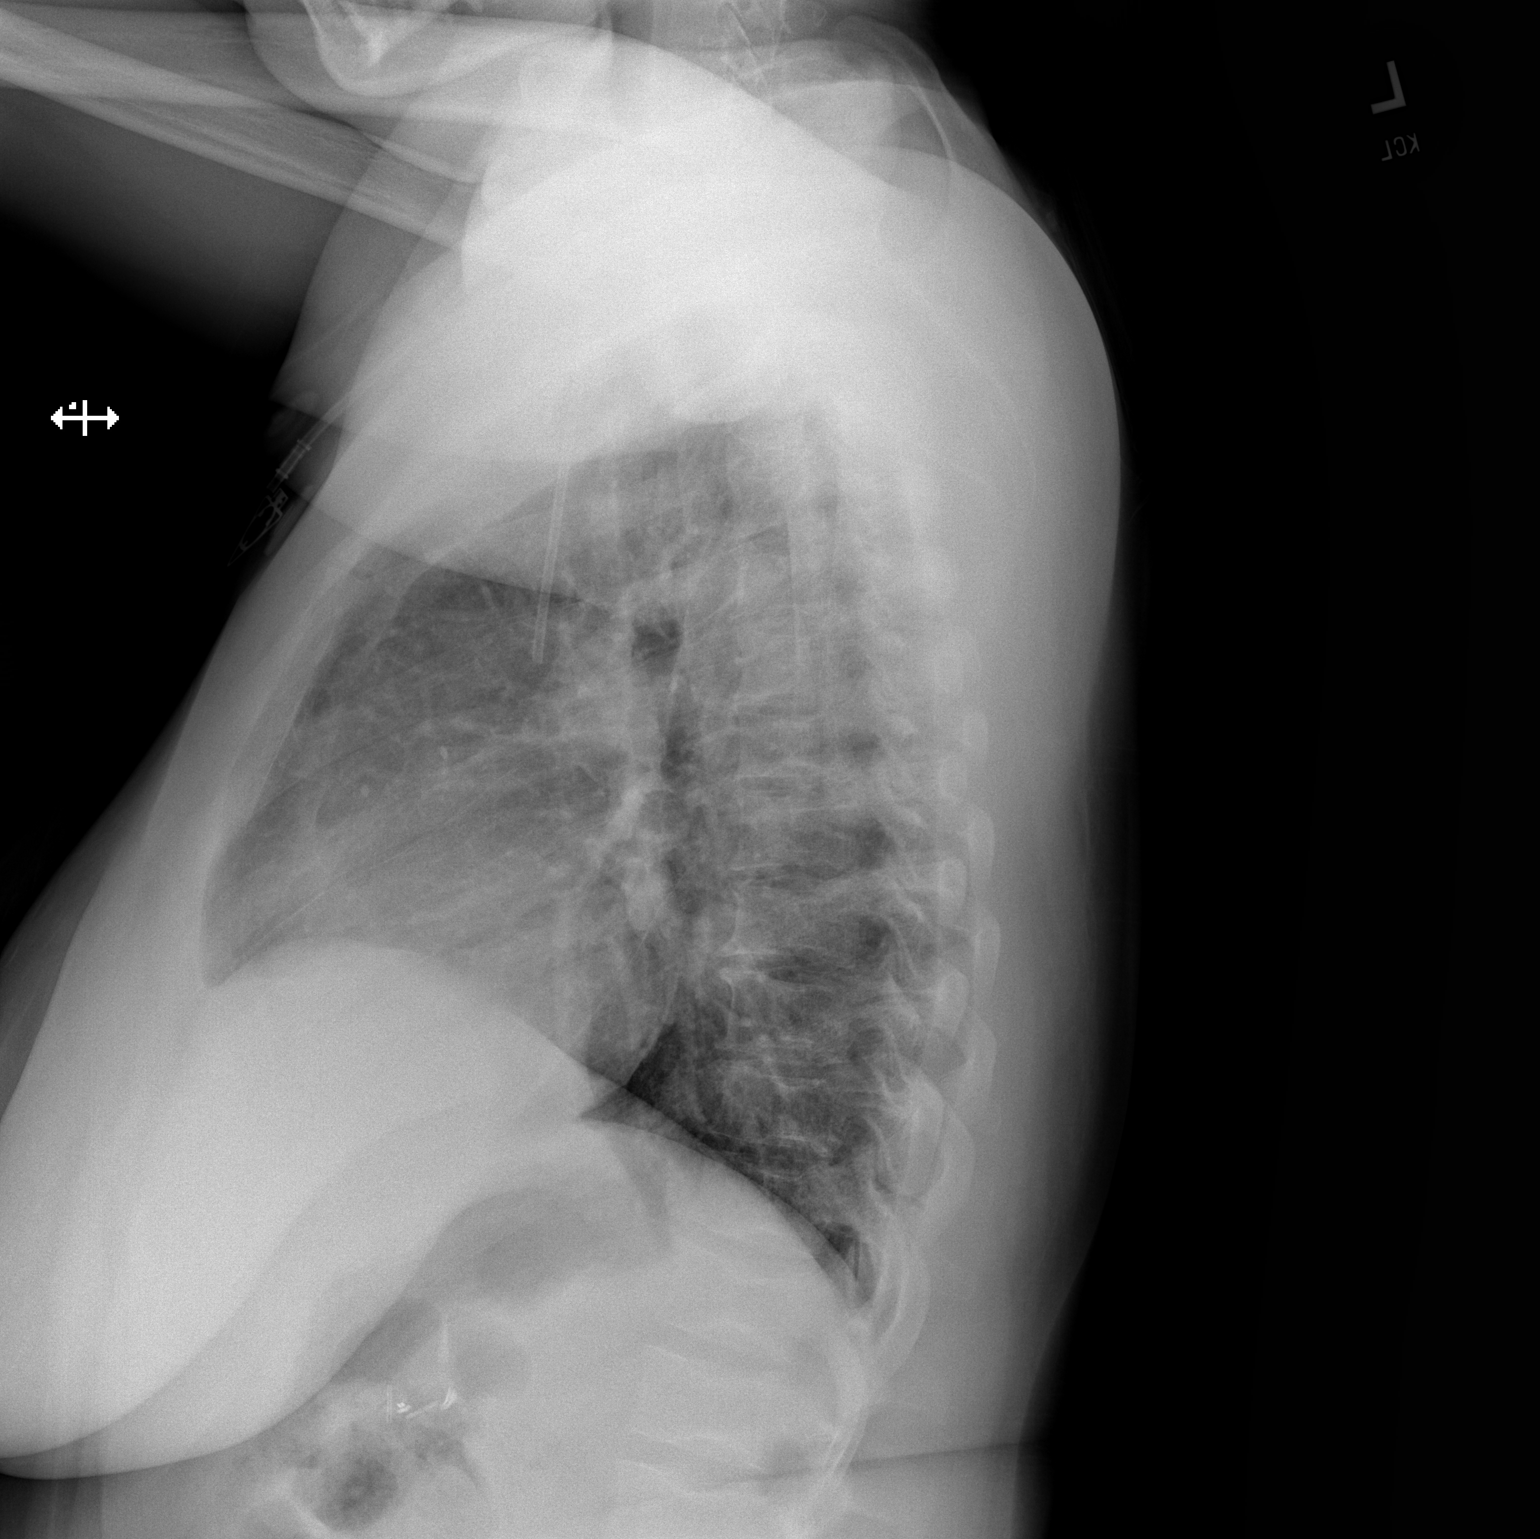

[2 of 2 positions shown; findings below may reference images not displayed]

FINDINGS: Unchanged right chest wall port catheter. The heart size and
mediastinal contours are within normal limits. Normal pulmonary
vascularity. No focal consolidation, pleural effusion, or
pneumothorax. Unchanged sclerosis in the bilateral humeral heads. No
acute osseous abnormality.
IMPRESSION: 1.  No active cardiopulmonary disease.
2. Unchanged bilateral humeral head avascular necrosis.

## 2018-08-14 IMAGING — CR DG ANKLE COMPLETE 3+V*R*
3 series · 3 of 3 positions shown · non-contrast
Comparison: RIGHT ankle radiograph July 16, 2013

CLINICAL DATA: Sickle cell pain, no injury.

EXAM:
RIGHT ANKLE - COMPLETE 3+ VIEW

[x ankle ap right]
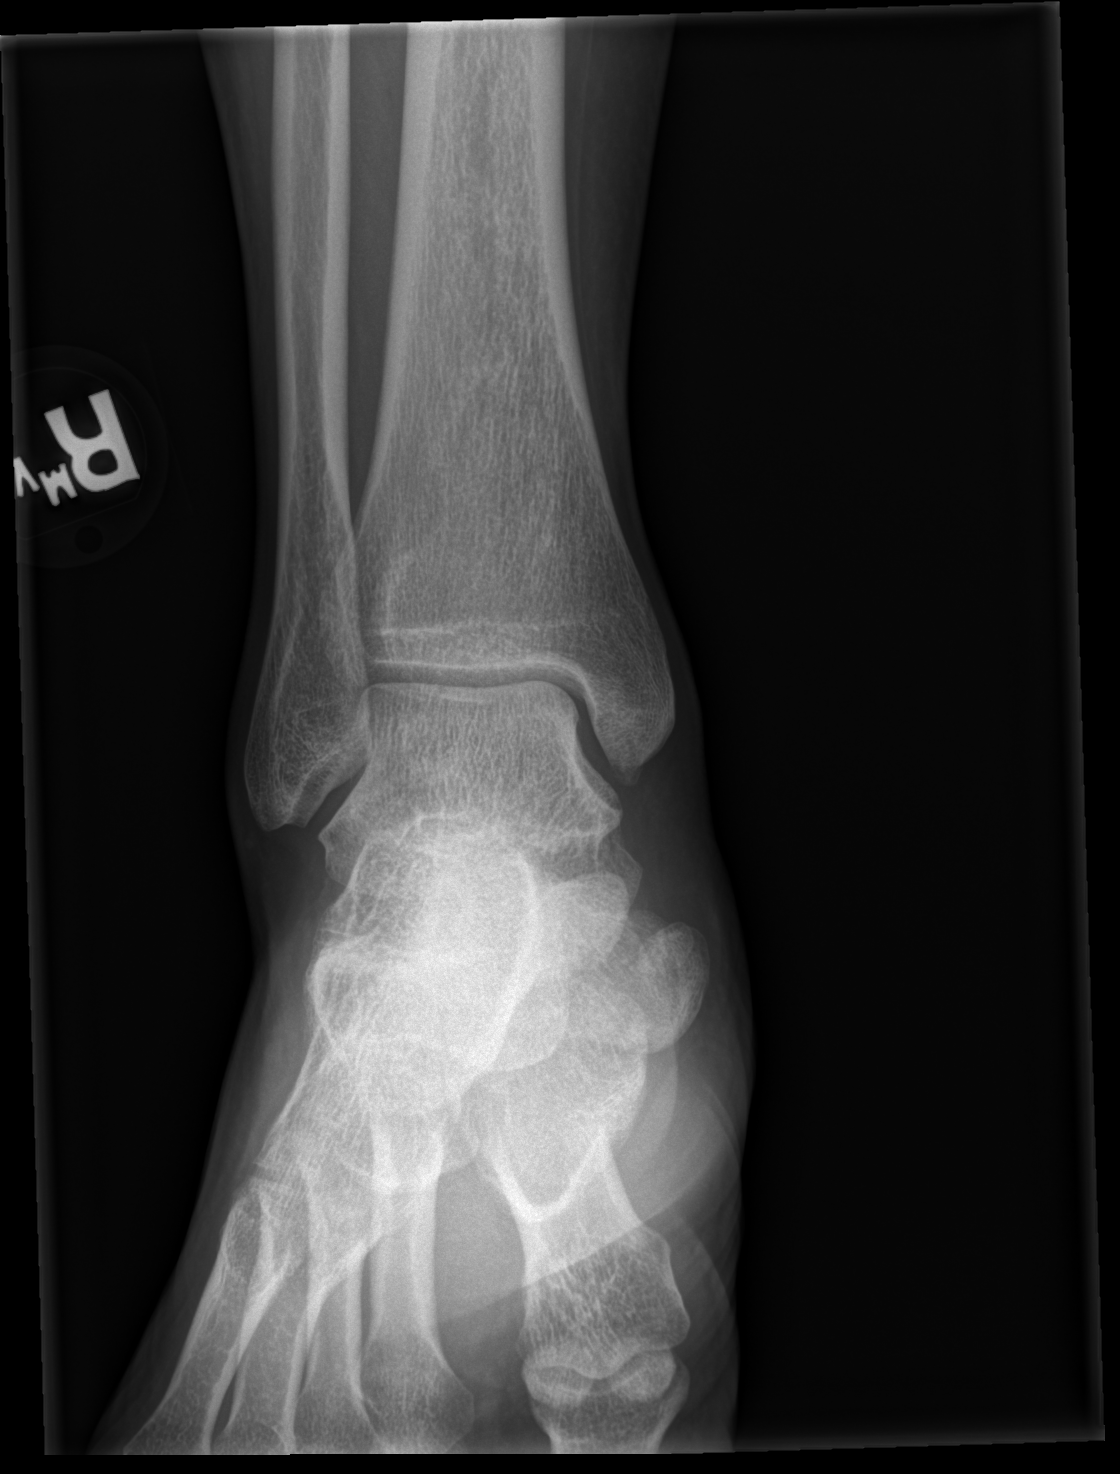

[x ankle obl right]
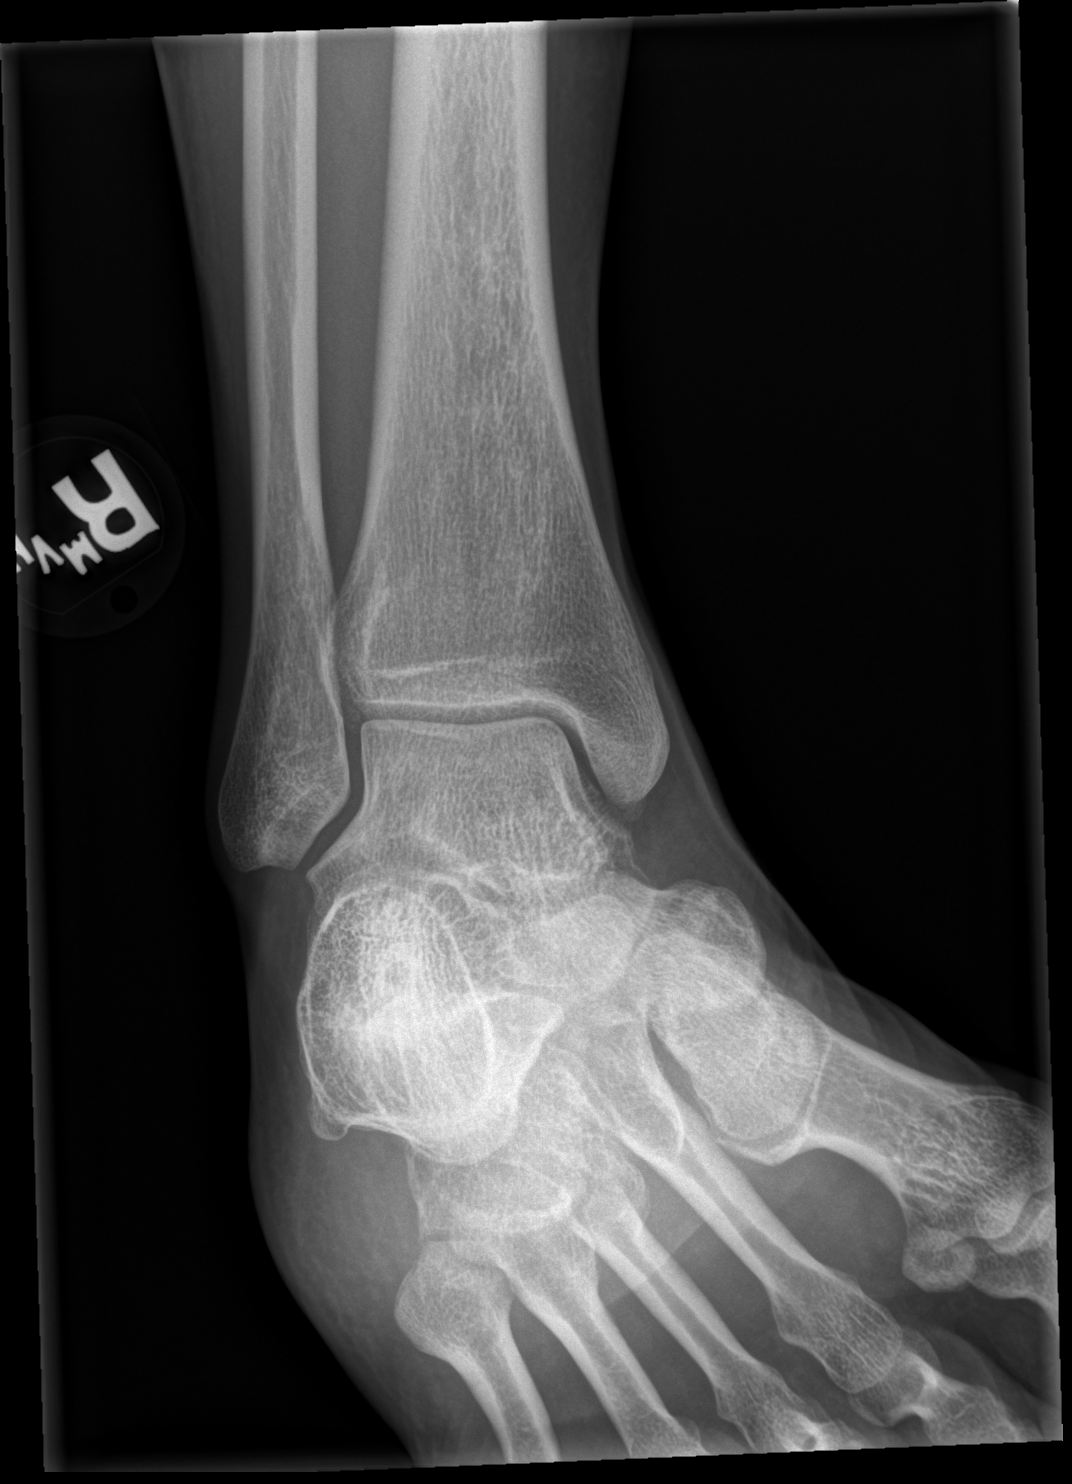

[x ankle lat right]
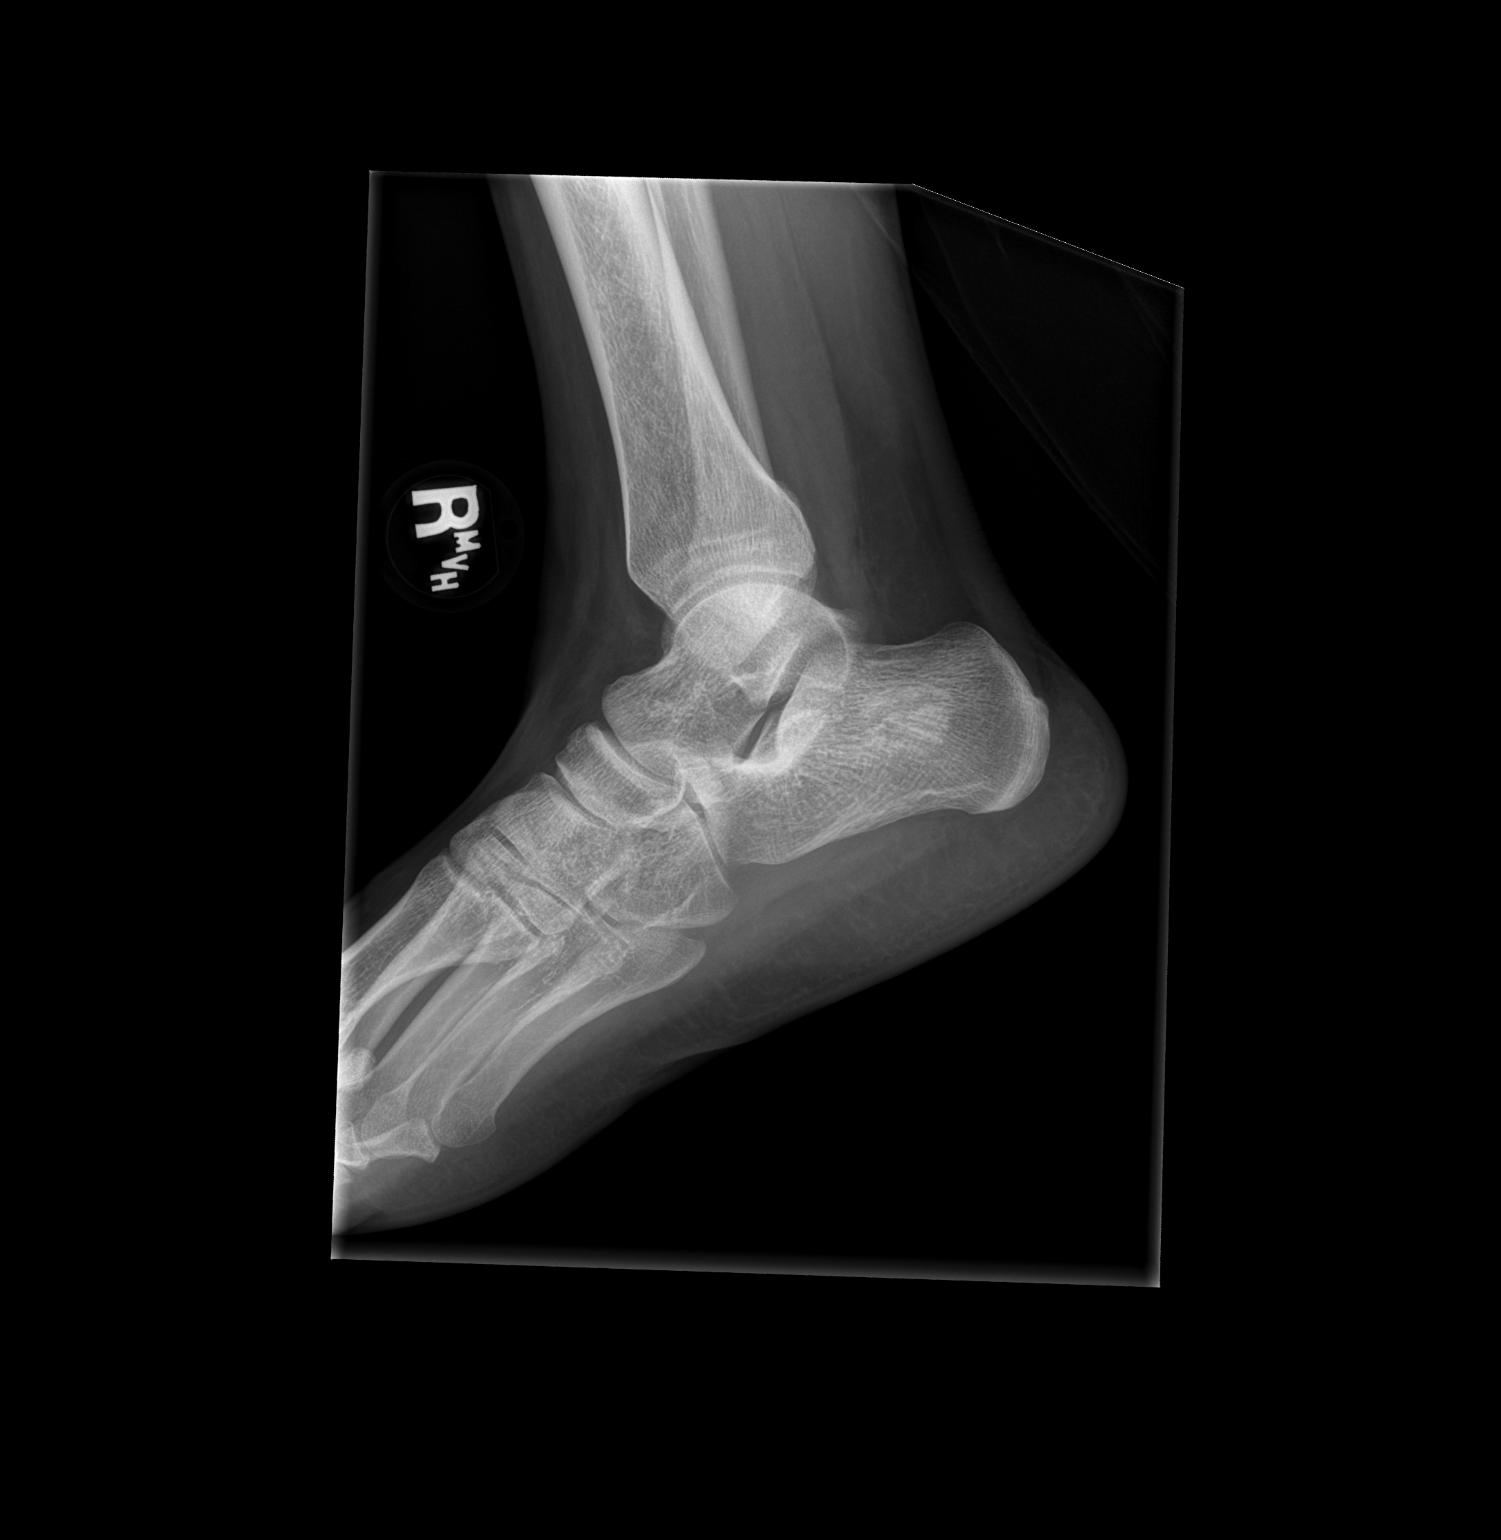

[3 of 3 positions shown; findings below may reference images not displayed]

FINDINGS: No fracture deformity nor dislocation. Faint sclerosis calcaneus and
distal tibia. The ankle mortise appears congruent and the
tibiofibular syndesmosis intact. No destructive bony lesions. Soft
tissue planes are non-suspicious.
IMPRESSION: Patchy sclerosis suggesting bone infarcts. No acute osseous process.

## 2018-08-14 IMAGING — CR DG THORACIC SPINE 2V
3 series · 3 of 3 positions shown · non-contrast
Comparison: None.

CLINICAL DATA: Sickle cell pain upper, lower back and right ankle
pain - all without injury x 1 day.

EXAM:
THORACIC SPINE 2 VIEWS

[t thoracic spine ap]
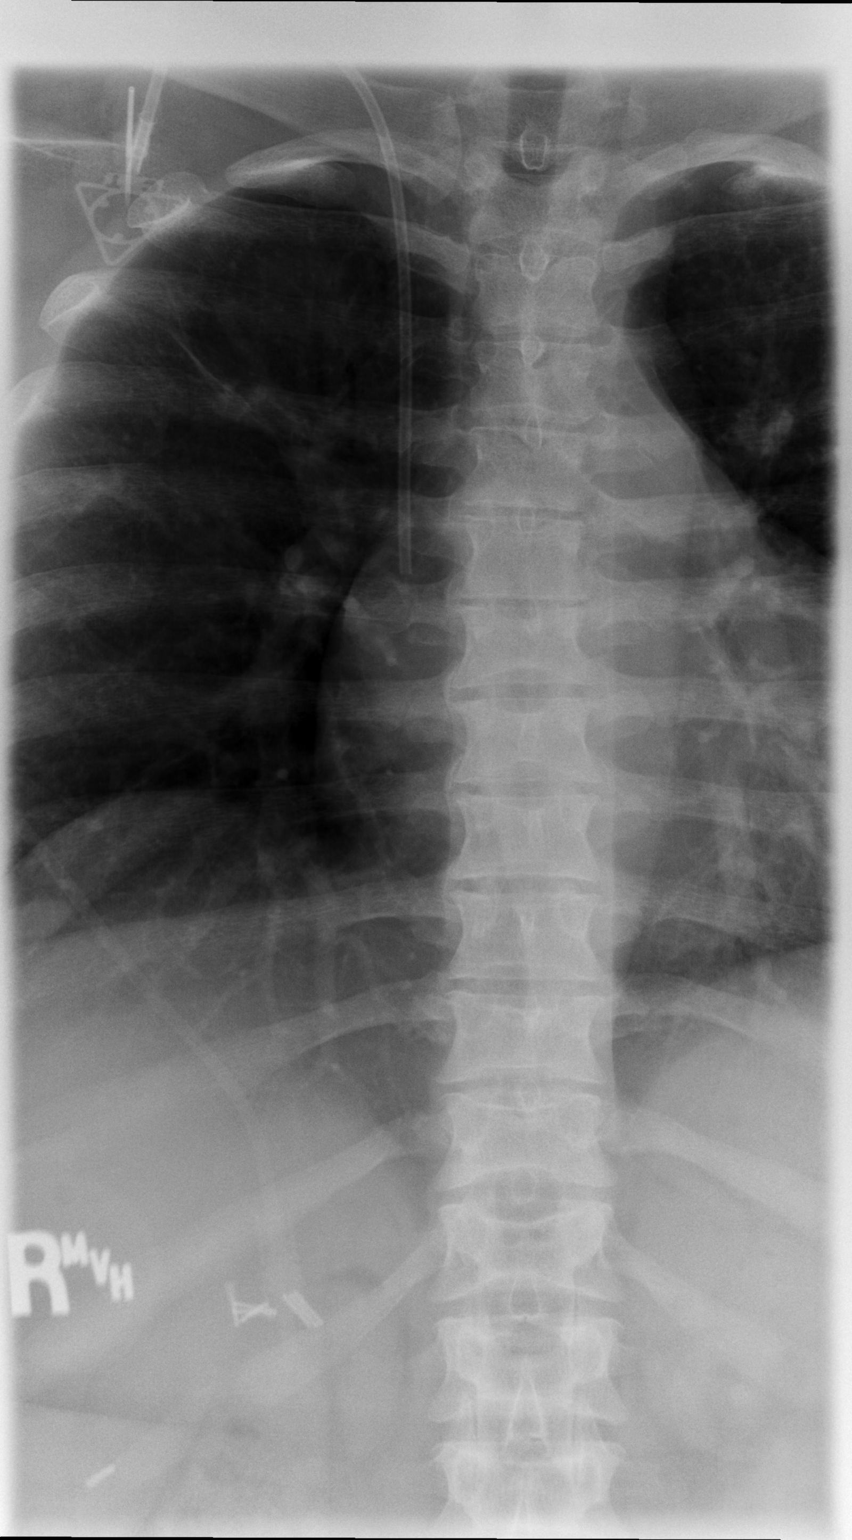

[t thoracic spine lat]
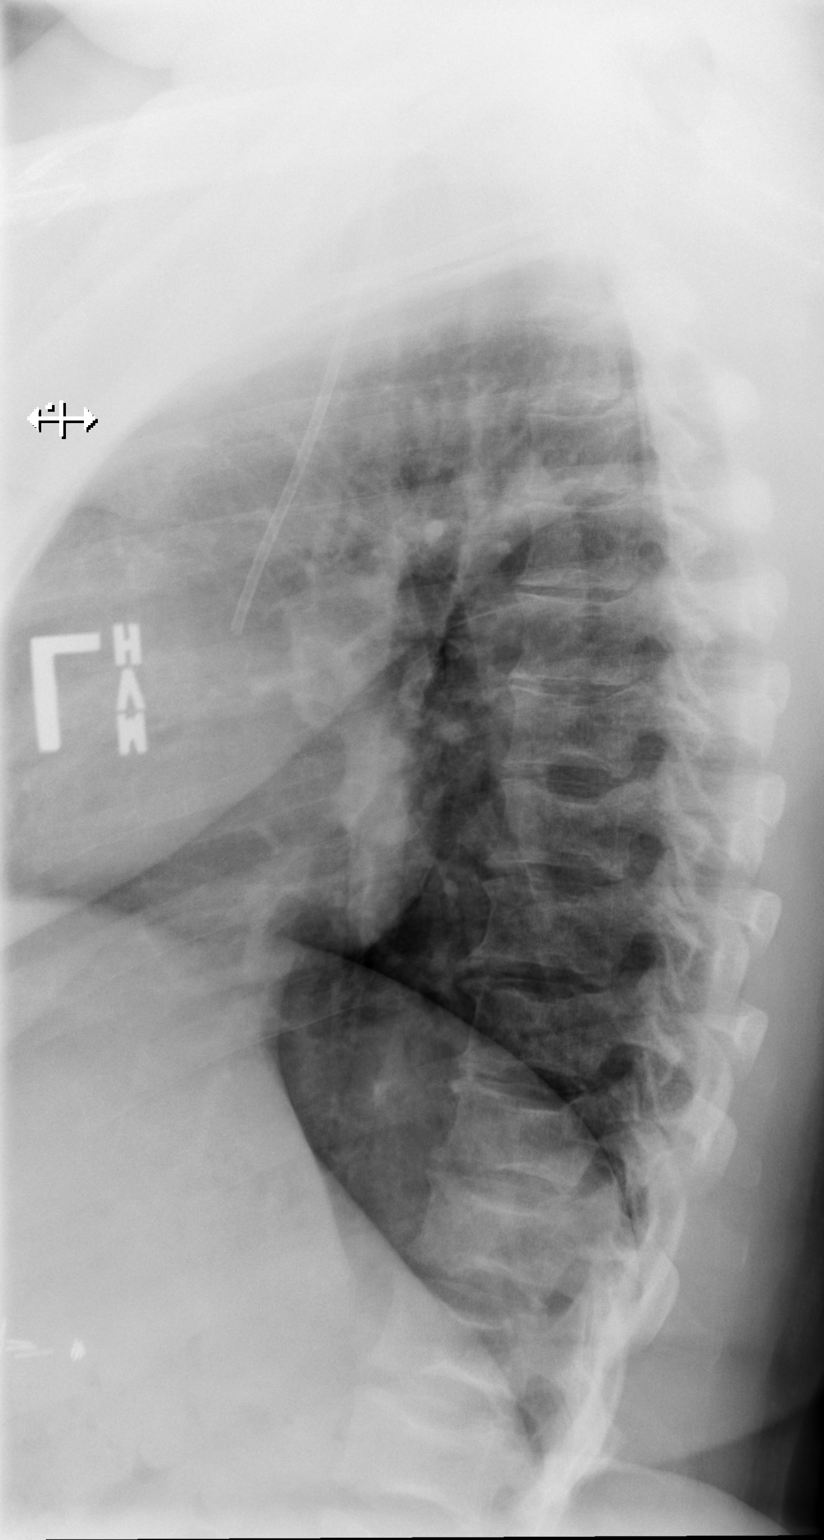

[t thoracic swimmers]
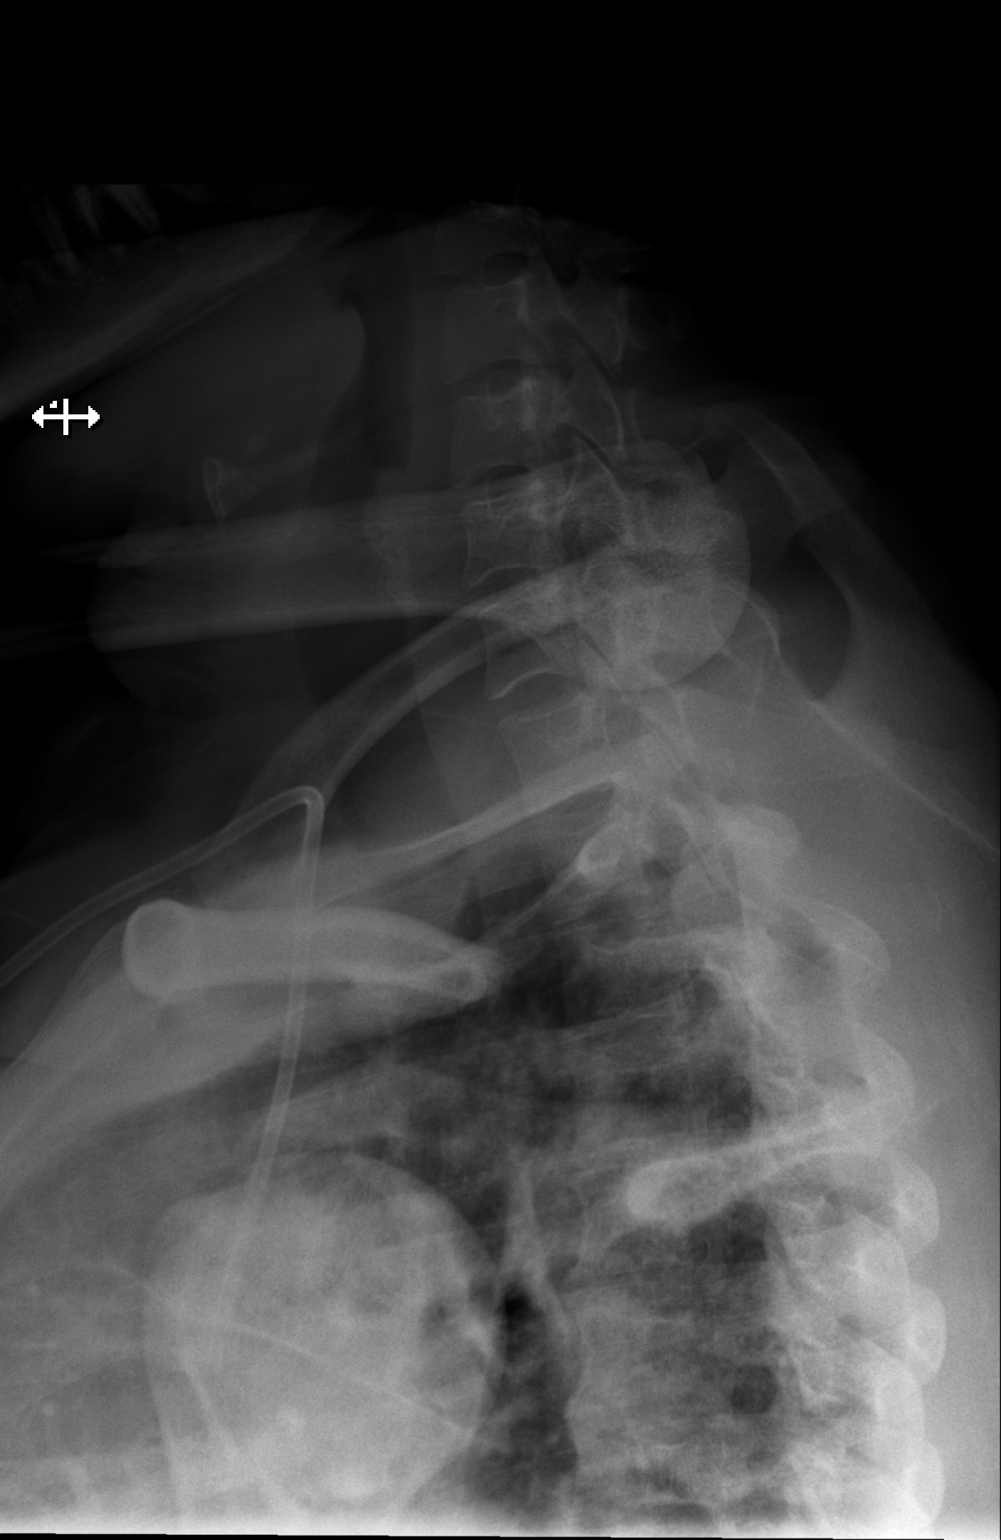

[3 of 3 positions shown; findings below may reference images not displayed]

FINDINGS: No acute appearing osseous abnormality. There is no evidence of
thoracic spine fracture. Alignment is normal. Majority of the
vertebral bodies within the mid and lower thoracic spine have an
H-shaped configuration compatible with sequela of sickle cell
disease.

Visualized paravertebral soft tissues are unremarkable.
Cholecystectomy clips in the RIGHT upper quadrant. RIGHT chest wall
Port-A-Cath in place with tip at the level of the mid/lower SVC.
IMPRESSION: 1. No acute findings.
2. Configuration of the thoracic vertebral bodies within the mid and
lower thoracic spine are compatible with sequela of sickle cell
disease.

## 2018-08-20 IMAGING — CR DG CHEST 2V
2 series · 2 of 2 positions shown · non-contrast
Comparison: 09/29/2017.

CLINICAL DATA: Sickle cell crisis.

EXAM:
CHEST - 2 VIEW

[w chest lat]
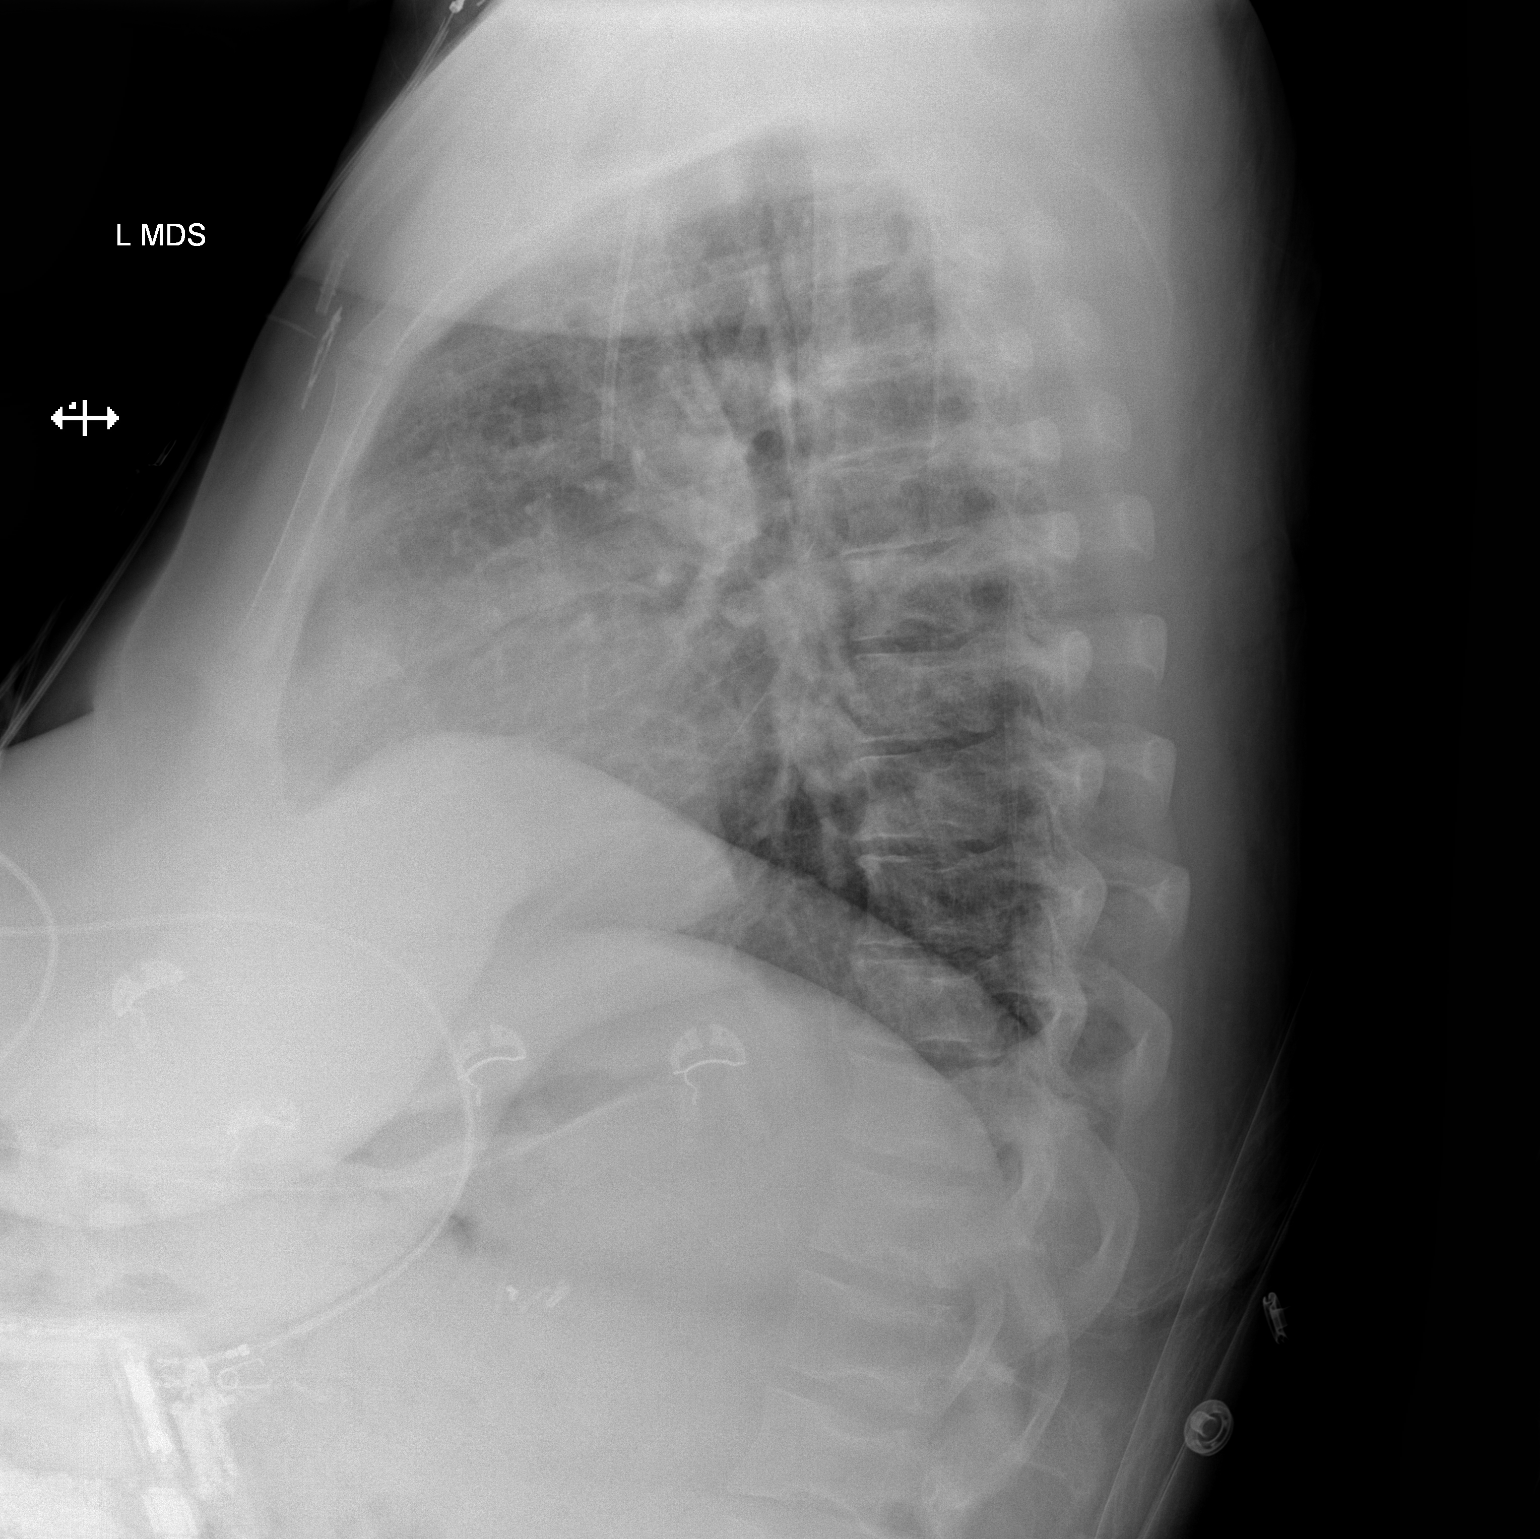

[x chest ap]
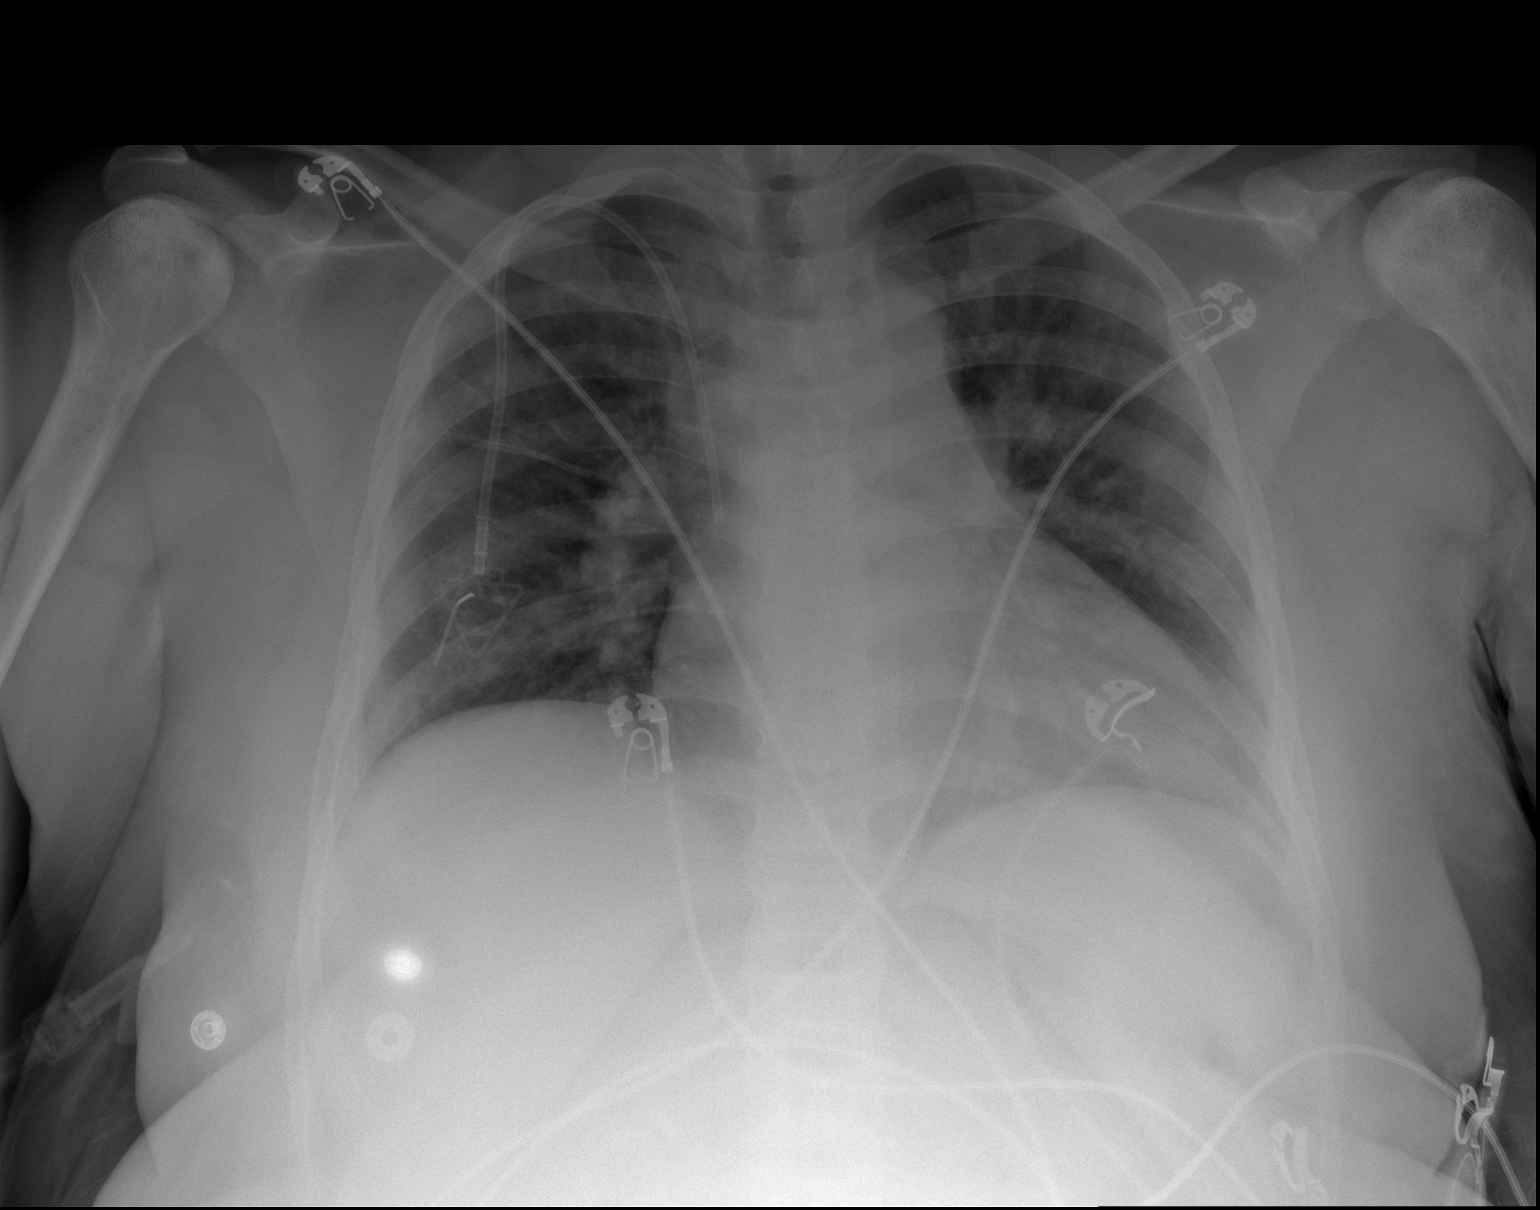

[2 of 2 positions shown; findings below may reference images not displayed]

FINDINGS: PowerPort catheter with tip over the superior vena cava.
Cardiomegaly with mild pulmonary vascular prominence. No focal
infiltrate. Low lung volumes. No pleural effusion or pneumothorax.
IMPRESSION: 1.  PowerPort catheter with tip over the superior vena cava.

2. Cardiomegaly with mild pulmonary vascular prominence. Mild right
mid lung field subsegmental atelectasis.

## 2018-08-20 IMAGING — DX DG CHEST 2V
2 series · 2 of 2 positions shown · non-contrast
Comparison: Chest x-ray of 10/05/2016

CLINICAL DATA: Fever for 1 day, history of sickle cell anemia,
smoking history

EXAM:
CHEST - 2 VIEW

[chest lat]
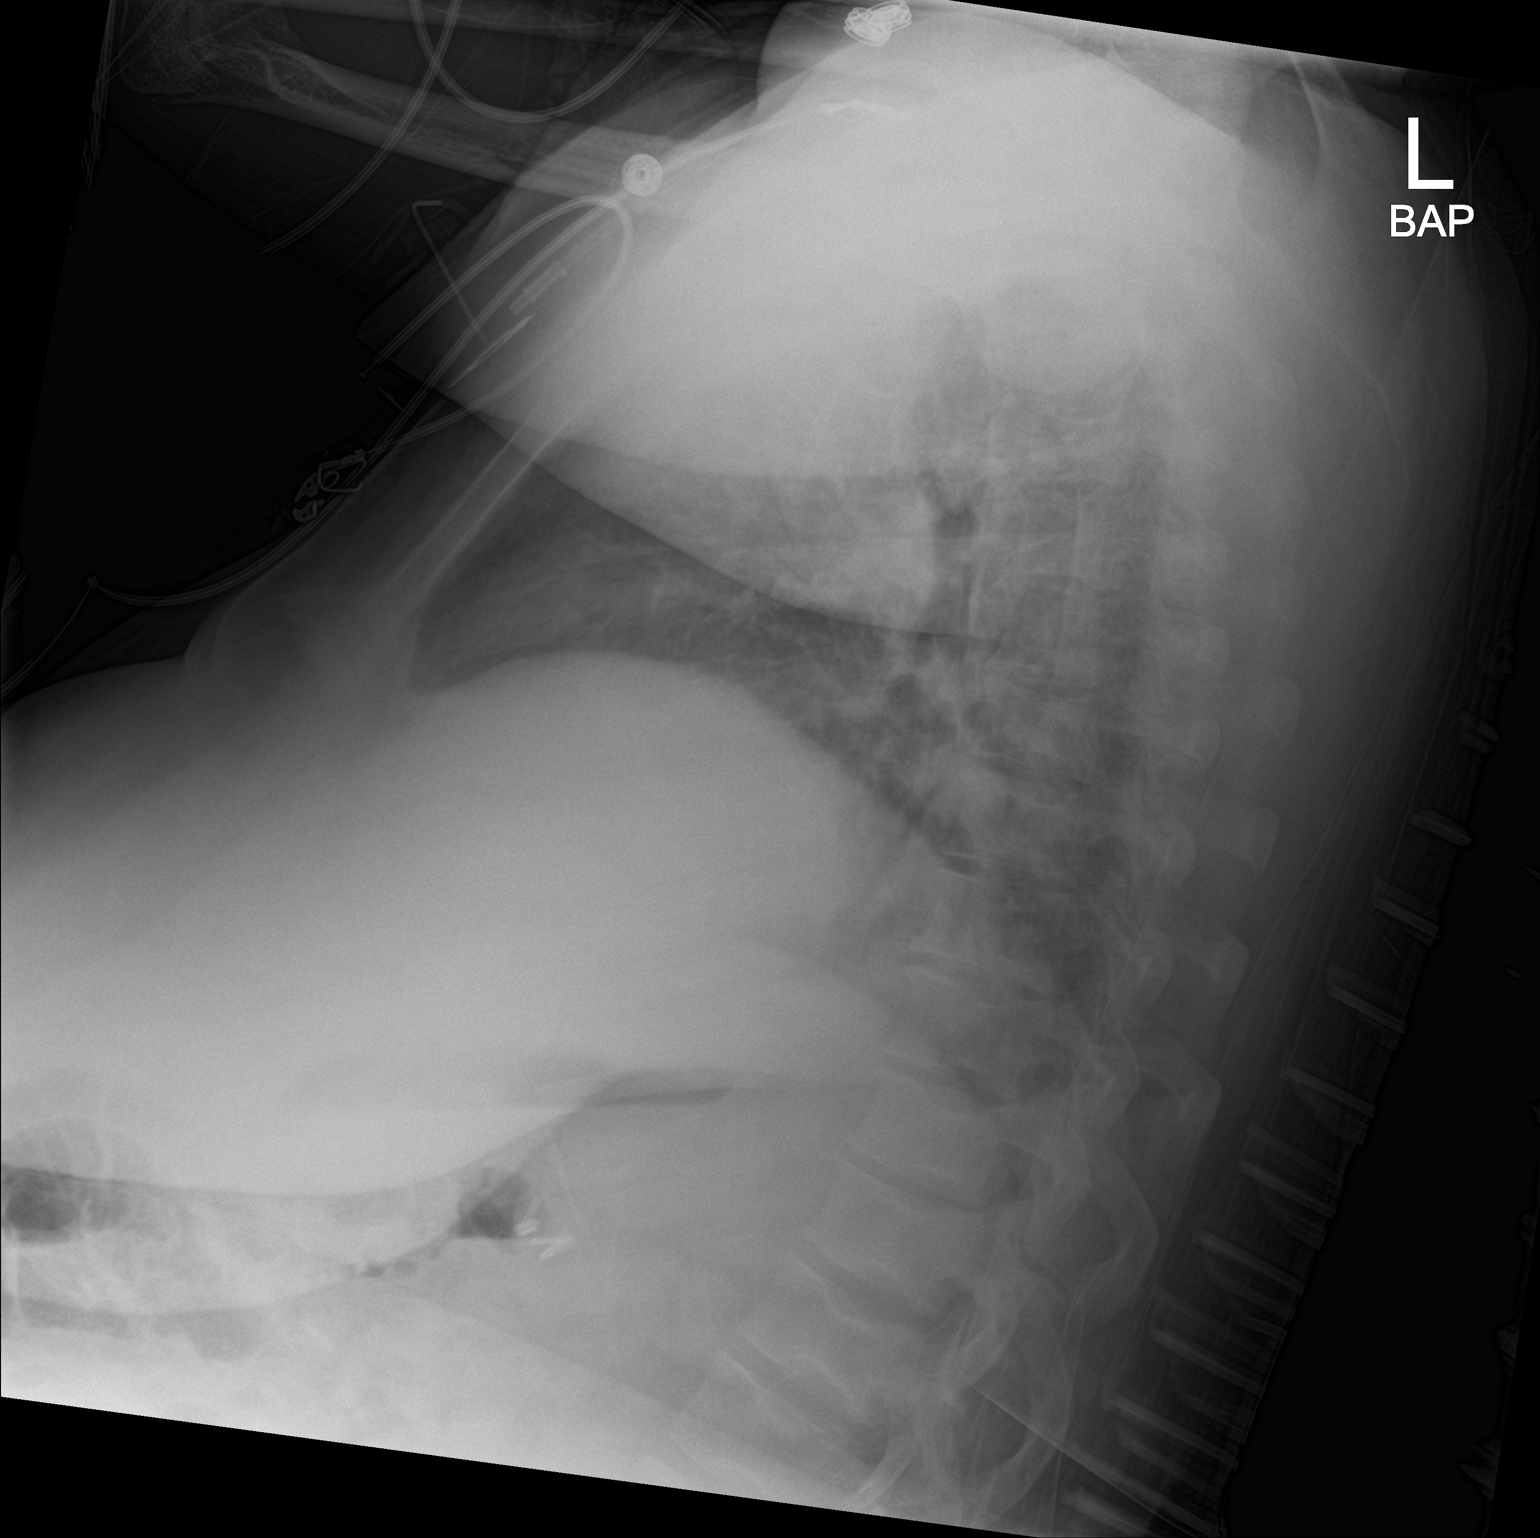

[chest ap]
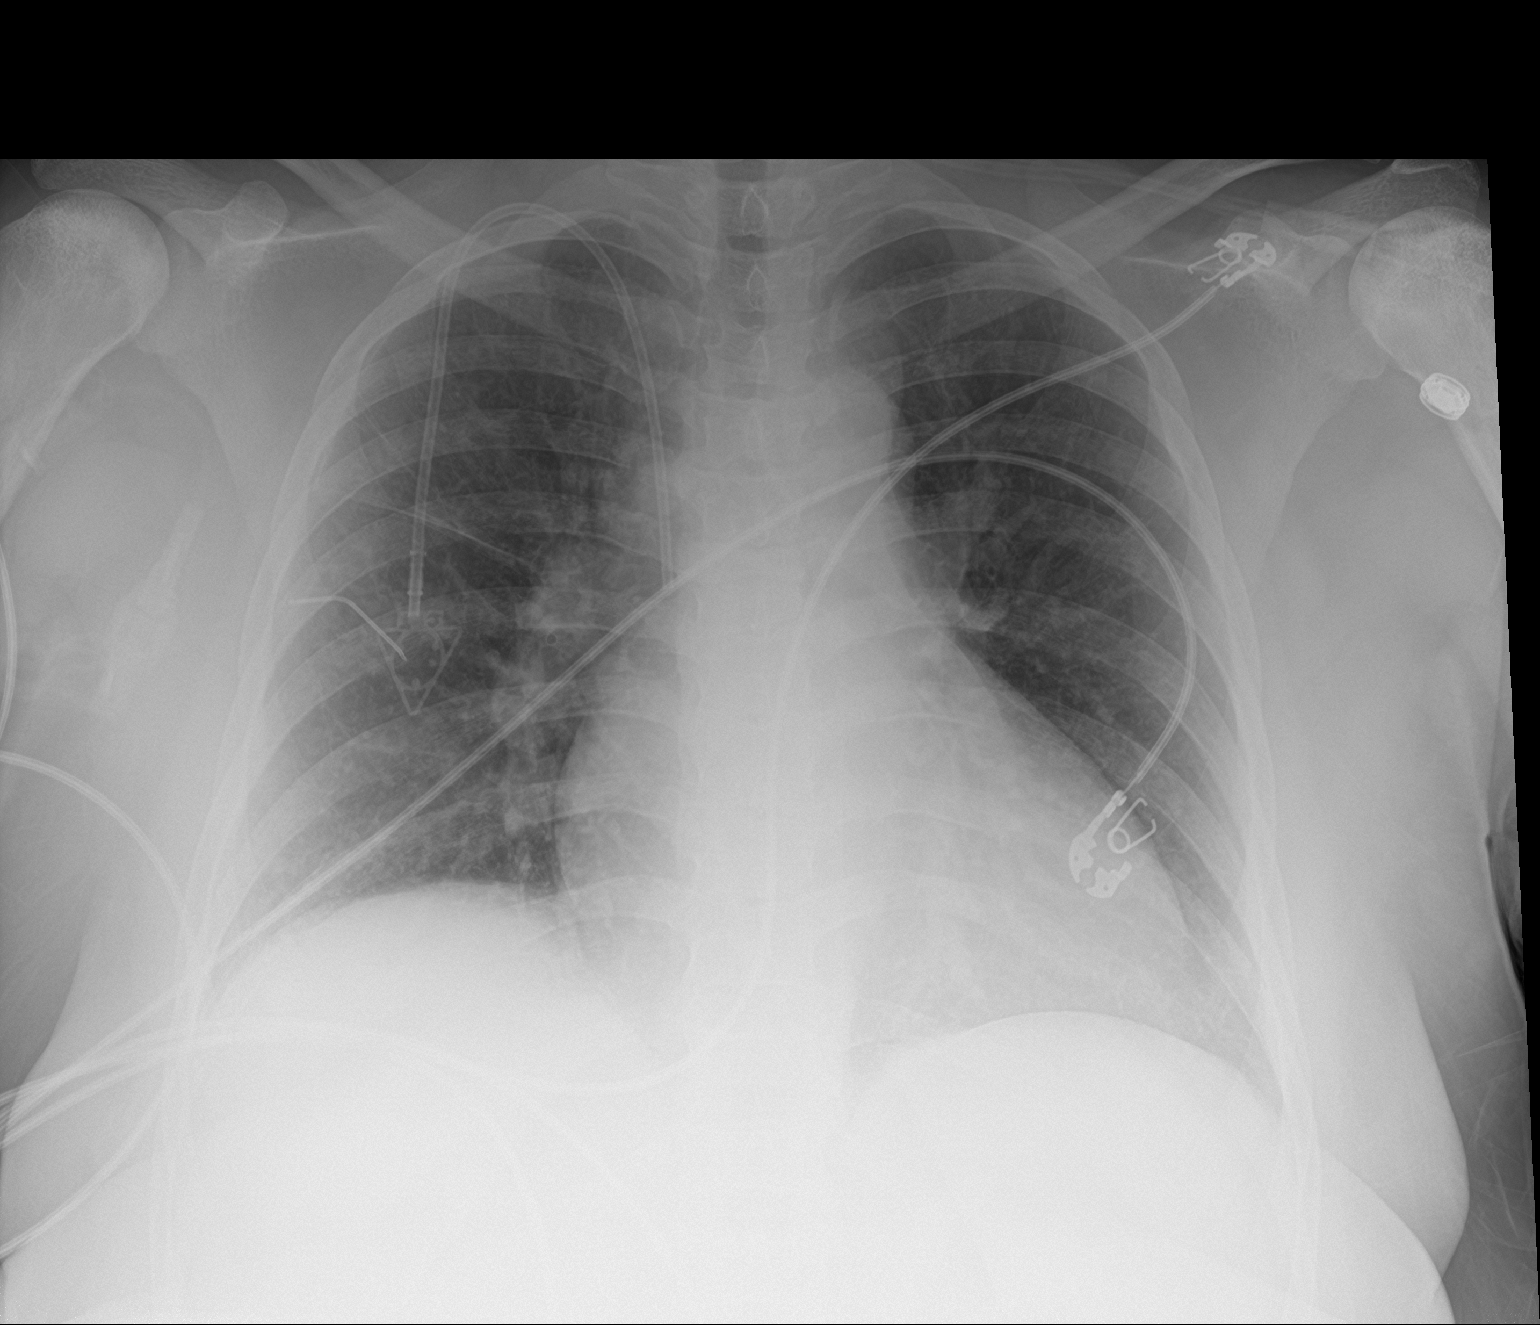

[2 of 2 positions shown; findings below may reference images not displayed]

FINDINGS: No active infiltrate or effusion is seen. Right-sided Port-A-Cath
remains with the tip overlying mid SVC. Mediastinal and hilar
contours are unremarkable and the heart is mildly enlarged.
Bilateral avascular necrosis of the humeral head is noted possibly
due to sickle cell disease.
IMPRESSION: 1. No active lung disease.
2. Port-A-Cath tip overlies the mid SVC.
3. Bilateral avascular necrosis of the humeral heads.

## 2018-08-28 ENCOUNTER — Other Ambulatory Visit: Payer: Self-pay | Admitting: *Deleted

## 2018-08-28 ENCOUNTER — Encounter: Payer: Self-pay | Admitting: *Deleted

## 2018-08-28 NOTE — Patient Outreach (Signed)
Outreach call for Tier 5 screening attempted. Phone is not in service at this time.  Plan to send outreach letter with [facility] information to patient address on file. Royetta Crochet. Laymond Purser, MSN, RN, [employer] , [place] [phone]) Toll Free Office

## 2018-09-11 NOTE — Telephone Encounter (Signed)
Message sent to provider 

## 2018-09-17 ENCOUNTER — Emergency Department (HOSPITAL_COMMUNITY)
Admission: EM | Admit: 2018-09-17 | Discharge: 2018-09-17 | Disposition: A | Discharge status: Home / Self Care | Payer: Medicare Other | Source: Home / Self Care | Attending: Emergency Medicine | Admitting: Emergency Medicine

## 2018-09-17 ENCOUNTER — Encounter (HOSPITAL_COMMUNITY): Payer: Self-pay | Admitting: Emergency Medicine

## 2018-09-17 ENCOUNTER — Other Ambulatory Visit: Payer: Self-pay

## 2018-09-17 DIAGNOSIS — Z86718 Personal history of other venous thrombosis and embolism: Secondary | ICD-10-CM | POA: Diagnosis not present

## 2018-09-17 DIAGNOSIS — F17210 Nicotine dependence, cigarettes, uncomplicated: Secondary | ICD-10-CM

## 2018-09-17 DIAGNOSIS — D638 Anemia in other chronic diseases classified elsewhere: Secondary | ICD-10-CM | POA: Diagnosis not present

## 2018-09-17 DIAGNOSIS — D5700 Hb-SS disease with crisis, unspecified: Secondary | ICD-10-CM

## 2018-09-17 DIAGNOSIS — Z832 Family history of diseases of the blood and blood-forming organs and certain disorders involving the immune mechanism: Secondary | ICD-10-CM | POA: Diagnosis not present

## 2018-09-17 DIAGNOSIS — G894 Chronic pain syndrome: Secondary | ICD-10-CM | POA: Diagnosis not present

## 2018-09-17 LAB — COMPREHENSIVE METABOLIC PANEL
ALT: 29 U/L (ref 0–44)
AST: 23 U/L (ref 15–41)
Albumin: 3.9 g/dL (ref 3.5–5.0)
Alkaline Phosphatase: 55 U/L (ref 38–126)
Anion gap: 7 (ref 5–15)
BUN: 10 mg/dL (ref 6–20)
CO2: 24 mmol/L (ref 22–32)
Calcium: 8.4 mg/dL — ABNORMAL LOW (ref 8.9–10.3)
Chloride: 111 mmol/L (ref 98–111)
Creatinine, Ser: 0.62 mg/dL (ref 0.44–1.00)
GFR calc Af Amer: >60 mL/min (ref >60)
GFR calc non Af Amer: >60 mL/min (ref >60)
Glucose, Bld: 109 mg/dL — ABNORMAL HIGH (ref 70–99)
Potassium: 3.4 mmol/L — ABNORMAL LOW (ref 3.5–5.1)
Sodium: 142 mmol/L (ref 135–145)
Total Bilirubin: 1.9 mg/dL — ABNORMAL HIGH (ref 0.3–1.2)
Total Protein: 6.7 g/dL (ref 6.5–8.1)

## 2018-09-17 LAB — CBC WITH DIFFERENTIAL/PLATELET
Abs Immature Granulocytes: 0.07 10*3/uL (ref 0.00–0.07)
Basophils Absolute: 0 10*3/uL (ref 0.0–0.1)
Basophils Relative: 0 %
Eosinophils Absolute: 0.4 10*3/uL (ref 0.0–0.5)
Eosinophils Relative: 4 %
HCT: 22.4 % — ABNORMAL LOW (ref 36.0–46.0)
Hemoglobin: 7.7 g/dL — ABNORMAL LOW (ref 12.0–15.0)
Immature Granulocytes: 1 %
Lymphocytes Relative: 20 %
Lymphs Abs: 1.9 10*3/uL (ref 0.7–4.0)
MCH: 34.8 pg — ABNORMAL HIGH (ref 26.0–34.0)
MCHC: 34.4 g/dL (ref 30.0–36.0)
MCV: 101.4 fL — ABNORMAL HIGH (ref 80.0–100.0)
Monocytes Absolute: 1.2 10*3/uL — ABNORMAL HIGH (ref 0.1–1.0)
Monocytes Relative: 13 %
Neutro Abs: 6.2 10*3/uL (ref 1.7–7.7)
Neutrophils Relative %: 62 %
Platelets: 450 10*3/uL — ABNORMAL HIGH (ref 150–400)
RBC: 2.21 MIL/uL — ABNORMAL LOW (ref 3.87–5.11)
RDW: 15 % (ref 11.5–15.5)
WBC: 9.9 10*3/uL (ref 4.0–10.5)
nRBC: 1.3 % — ABNORMAL HIGH (ref 0.0–0.2)

## 2018-09-17 LAB — I-STAT BETA HCG BLOOD, ED (MC, WL, AP ONLY): I-stat hCG, quantitative: <5 m[IU]/mL (ref <5)

## 2018-09-17 LAB — RETICULOCYTES
Immature Retic Fract: 38 % — ABNORMAL HIGH (ref 2.3–15.9)
RBC.: 2.21 MIL/uL — ABNORMAL LOW (ref 3.87–5.11)
Retic Count, Absolute: 227.9 10*3/uL — ABNORMAL HIGH (ref 19.0–186.0)
Retic Ct Pct: 10.3 % — ABNORMAL HIGH (ref 0.4–3.1)

## 2018-09-17 MED ORDER — HYDROMORPHONE HCL 1 MG/ML IJ SOLN: 1.0000 mg | INTRAMUSCULAR | Status: AC

## 2018-09-17 MED ORDER — SODIUM CHLORIDE 0.45 % IV SOLN
INTRAVENOUS | Status: AC
  Administered 2018-09-17: 17:00:00 via INTRAVENOUS

## 2018-09-17 MED ORDER — HYDROMORPHONE HCL 1 MG/ML IJ SOLN
0.5000 mg | INTRAMUSCULAR | Status: AC
  Filled 2018-09-17: qty 1

## 2018-09-17 MED ORDER — FOLIC ACID 1 MG PO TABS: 1.0000 mg | ORAL_TABLET | Freq: Every day | ORAL | 0 refills | Status: AC

## 2018-09-17 MED ORDER — ONDANSETRON HCL 4 MG/2ML IJ SOLN
4.0000 mg | INTRAMUSCULAR | Status: DC | PRN
  Administered 2018-09-17: 4 mg via INTRAVENOUS
  Filled 2018-09-17: qty 2

## 2018-09-17 MED ORDER — HYDROMORPHONE HCL 1 MG/ML IJ SOLN
1.0000 mg | INTRAMUSCULAR | Status: AC
  Administered 2018-09-17: 1 mg via INTRAVENOUS
  Filled 2018-09-17: qty 1

## 2018-09-17 MED ORDER — HYDROMORPHONE HCL 1 MG/ML IJ SOLN
0.5000 mg | INTRAMUSCULAR | Status: AC
  Administered 2018-09-17: 0.5 mg via SUBCUTANEOUS

## 2018-09-17 MED ORDER — HYDROXYUREA 500 MG PO CAPS: 500.0000 mg | ORAL_CAPSULE | Freq: Two times a day (BID) | ORAL | 0 refills | Status: AC

## 2018-09-17 MED ORDER — IBUPROFEN 800 MG PO TABS: 800.0000 mg | ORAL_TABLET | Freq: Three times a day (TID) | ORAL | 0 refills | Status: AC | PRN

## 2018-09-17 MED ORDER — DIPHENHYDRAMINE HCL 50 MG/ML IJ SOLN
25.0000 mg | Freq: Once | INTRAMUSCULAR | Status: AC
  Administered 2018-09-17: 25 mg via INTRAVENOUS
  Filled 2018-09-17: qty 1

## 2018-09-17 NOTE — ED Provider Notes (Signed)
Blood pressure 128/64, pulse 95, temperature 98.5 F (36.9 C), temperature source Oral, resp. rate 18, last menstrual period 09/16/2018, SpO2 98 %.  Assuming care from Dr. Kathrynn Humble.  In short, [name] is a 28 y.o. female with a chief complaint of Elbow Pain; Sickle Cell Pain Crisis; and Medication Refill .  Refer to the original H&P for additional details.  The current plan of care is to f/u labs and after attempt at pain control in the ED.  05:52 PM  Reassessed the patient.  She states she is feeling much better.  She is receiving her final dose of pain medication but feels ready for discharge.  She is asking that I refill her hydroxyurea, folic acid, Motrin.  I have provided a 30-day prescription in the setting of COVID 19 and understanding that is difficult to get in touch with her hematologist/PCP.  [facility] ED return precautions in detail.     Margette Fast, MD 09/17/18 [phone]

## 2018-09-17 NOTE — ED Triage Notes (Signed)
Pt reports that she had bilat elbow pains since yesterday. reports taking ibuprofen and ibuprofen. Reports that needs refill of medication bc cant get a PCP. Reports been clean of narcotics for her sickle cell for 8 months.

## 2018-09-17 NOTE — Discharge Instructions (Signed)
Take your medication as prescribed and follow up with your PCP and Hematologist for additional refills. Return to the ED with any new or worsening symptoms.

## 2018-09-17 NOTE — ED Notes (Signed)
ED Provider at bedside. 

## 2018-09-18 ENCOUNTER — Encounter (HOSPITAL_COMMUNITY): Payer: Self-pay | Admitting: *Deleted

## 2018-09-18 ENCOUNTER — Inpatient Hospital Stay (HOSPITAL_COMMUNITY)
Admission: AD | Admit: 2018-09-18 | Discharge: 2018-09-20 | DRG: 812 | Disposition: A | Discharge status: Home / Self Care | Payer: Medicare Other | Source: Ambulatory Visit | Attending: Internal Medicine | Admitting: Internal Medicine

## 2018-09-18 ENCOUNTER — Encounter (HOSPITAL_COMMUNITY): Payer: Self-pay | Admitting: Emergency Medicine

## 2018-09-18 ENCOUNTER — Emergency Department (HOSPITAL_COMMUNITY)
Admission: EM | Admit: 2018-09-18 | Discharge: 2018-09-18 | Disposition: A | Discharge status: Home / Self Care | Payer: Medicare Other | Source: Home / Self Care | Attending: Emergency Medicine | Admitting: Emergency Medicine

## 2018-09-18 ENCOUNTER — Other Ambulatory Visit: Payer: Self-pay

## 2018-09-18 DIAGNOSIS — Z91040 Latex allergy status: Secondary | ICD-10-CM

## 2018-09-18 DIAGNOSIS — D5700 Hb-SS disease with crisis, unspecified: Secondary | ICD-10-CM | POA: Diagnosis not present

## 2018-09-18 DIAGNOSIS — Z86718 Personal history of other venous thrombosis and embolism: Secondary | ICD-10-CM | POA: Diagnosis not present

## 2018-09-18 DIAGNOSIS — D638 Anemia in other chronic diseases classified elsewhere: Secondary | ICD-10-CM | POA: Diagnosis present

## 2018-09-18 DIAGNOSIS — Z832 Family history of diseases of the blood and blood-forming organs and certain disorders involving the immune mechanism: Secondary | ICD-10-CM

## 2018-09-18 DIAGNOSIS — K219 Gastro-esophageal reflux disease without esophagitis: Secondary | ICD-10-CM | POA: Diagnosis present

## 2018-09-18 DIAGNOSIS — F17210 Nicotine dependence, cigarettes, uncomplicated: Secondary | ICD-10-CM | POA: Insufficient documentation

## 2018-09-18 DIAGNOSIS — G894 Chronic pain syndrome: Secondary | ICD-10-CM | POA: Diagnosis present

## 2018-09-18 DIAGNOSIS — D72829 Elevated white blood cell count, unspecified: Secondary | ICD-10-CM | POA: Diagnosis not present

## 2018-09-18 DIAGNOSIS — Z79899 Other long term (current) drug therapy: Secondary | ICD-10-CM | POA: Diagnosis not present

## 2018-09-18 DIAGNOSIS — G8929 Other chronic pain: Secondary | ICD-10-CM

## 2018-09-18 LAB — CBC
HCT: 22.6 % — ABNORMAL LOW (ref 36.0–46.0)
Hemoglobin: 7.4 g/dL — ABNORMAL LOW (ref 12.0–15.0)
MCH: 33.3 pg (ref 26.0–34.0)
MCHC: 32.7 g/dL (ref 30.0–36.0)
MCV: 101.8 fL — ABNORMAL HIGH (ref 80.0–100.0)
Platelets: 432 10*3/uL — ABNORMAL HIGH (ref 150–400)
RBC: 2.22 MIL/uL — ABNORMAL LOW (ref 3.87–5.11)
RDW: 15.4 % (ref 11.5–15.5)
WBC: 12.4 10*3/uL — ABNORMAL HIGH (ref 4.0–10.5)
nRBC: 1 % — ABNORMAL HIGH (ref 0.0–0.2)

## 2018-09-18 LAB — BASIC METABOLIC PANEL
Anion gap: 5 (ref 5–15)
BUN: 13 mg/dL (ref 6–20)
CO2: 24 mmol/L (ref 22–32)
Calcium: 7.8 mg/dL — ABNORMAL LOW (ref 8.9–10.3)
Chloride: 107 mmol/L (ref 98–111)
Creatinine, Ser: 0.67 mg/dL (ref 0.44–1.00)
GFR calc Af Amer: >60 mL/min (ref >60)
GFR calc non Af Amer: >60 mL/min (ref >60)
Glucose, Bld: 105 mg/dL — ABNORMAL HIGH (ref 70–99)
Potassium: 4 mmol/L (ref 3.5–5.1)
Sodium: 136 mmol/L (ref 135–145)

## 2018-09-18 LAB — RETICULOCYTES
Immature Retic Fract: 41.4 % — ABNORMAL HIGH (ref 2.3–15.9)
RBC.: 2.22 MIL/uL — ABNORMAL LOW (ref 3.87–5.11)
Retic Count, Absolute: 236 10*3/uL — ABNORMAL HIGH (ref 19.0–186.0)
Retic Ct Pct: 10.6 % — ABNORMAL HIGH (ref 0.4–3.1)

## 2018-09-18 MED ORDER — HYDROMORPHONE HCL 2 MG/ML IJ SOLN
2.0000 mg | INTRAMUSCULAR | Status: AC
  Administered 2018-09-18: 2 mg via INTRAVENOUS
  Filled 2018-09-18 (×2): qty 1

## 2018-09-18 MED ORDER — HYDROMORPHONE HCL 2 MG/ML IJ SOLN: 2.0000 mg | INTRAMUSCULAR | Status: AC

## 2018-09-18 MED ORDER — DIPHENHYDRAMINE HCL 25 MG PO CAPS
25.0000 mg | ORAL_CAPSULE | ORAL | Status: DC | PRN
  Administered 2018-09-18: 25 mg via ORAL
  Filled 2018-09-18: qty 2

## 2018-09-18 MED ORDER — FOLIC ACID 1 MG PO TABS
1.0000 mg | ORAL_TABLET | Freq: Every day | ORAL | Status: DC
  Administered 2018-09-18 – 2018-09-20 (×3): 1 mg via ORAL
  Filled 2018-09-18 (×3): qty 1

## 2018-09-18 MED ORDER — KETOROLAC TROMETHAMINE 30 MG/ML IJ SOLN
15.0000 mg | Freq: Once | INTRAMUSCULAR | Status: AC
  Administered 2018-09-18: 15:00:00 15 mg via INTRAVENOUS
  Filled 2018-09-18: qty 1

## 2018-09-18 MED ORDER — HYDROXYUREA 500 MG PO CAPS
500.0000 mg | ORAL_CAPSULE | Freq: Two times a day (BID) | ORAL | Status: DC
  Administered 2018-09-18 – 2018-09-20 (×4): 500 mg via ORAL
  Filled 2018-09-18 (×4): qty 1

## 2018-09-18 MED ORDER — SENNOSIDES-DOCUSATE SODIUM 8.6-50 MG PO TABS: 1.0000 | ORAL_TABLET | Freq: Two times a day (BID) | ORAL | Status: DC

## 2018-09-18 MED ORDER — HEPARIN SOD (PORK) LOCK FLUSH 100 UNIT/ML IV SOLN
500.0000 [IU] | INTRAVENOUS | Status: DC | PRN
  Filled 2018-09-18: qty 5

## 2018-09-18 MED ORDER — SODIUM CHLORIDE 0.45 % IV BOLUS
1000.0000 mL | Freq: Once | INTRAVENOUS | Status: AC
  Administered 2018-09-18: 1000 mL via INTRAVENOUS

## 2018-09-18 MED ORDER — ONDANSETRON HCL 4 MG/2ML IJ SOLN
4.0000 mg | INTRAMUSCULAR | Status: DC | PRN
  Administered 2018-09-18: 4 mg via INTRAVENOUS
  Filled 2018-09-18: qty 2

## 2018-09-18 MED ORDER — HYDROMORPHONE 1 MG/ML IV SOLN
INTRAVENOUS | Status: DC
  Administered 2018-09-18: 1.2 mg via INTRAVENOUS
  Administered 2018-09-18: 30 mg via INTRAVENOUS
  Administered 2018-09-18: 4.4 mg via INTRAVENOUS
  Administered 2018-09-19: 1.2 mg via INTRAVENOUS
  Administered 2018-09-19: 3.6 mg via INTRAVENOUS
  Administered 2018-09-19: 1.8 mg via INTRAVENOUS
  Filled 2018-09-18: qty 30

## 2018-09-18 MED ORDER — DIPHENHYDRAMINE HCL 12.5 MG/5ML PO ELIX: 12.5000 mg | ORAL_SOLUTION | Freq: Four times a day (QID) | ORAL | Status: DC | PRN

## 2018-09-18 MED ORDER — ONDANSETRON HCL 4 MG/2ML IJ SOLN: 4.0000 mg | Freq: Four times a day (QID) | INTRAMUSCULAR | Status: DC | PRN

## 2018-09-18 MED ORDER — POLYETHYLENE GLYCOL 3350 17 G PO PACK: 17.0000 g | PACK | Freq: Every day | ORAL | Status: DC | PRN

## 2018-09-18 MED ORDER — HYDROMORPHONE HCL 2 MG/ML IJ SOLN
2.0000 mg | Freq: Once | INTRAMUSCULAR | Status: AC
  Administered 2018-09-18: 2 mg via INTRAVENOUS
  Filled 2018-09-18: qty 1

## 2018-09-18 MED ORDER — GABAPENTIN 300 MG PO CAPS
300.0000 mg | ORAL_CAPSULE | Freq: Two times a day (BID) | ORAL | Status: DC
  Administered 2018-09-18 – 2018-09-20 (×4): 300 mg via ORAL
  Filled 2018-09-18 (×4): qty 1

## 2018-09-18 MED ORDER — HYDROMORPHONE HCL 2 MG/ML IJ SOLN
2.0000 mg | INTRAMUSCULAR | Status: AC
  Administered 2018-09-18: 2 mg via INTRAVENOUS

## 2018-09-18 MED ORDER — SODIUM CHLORIDE 0.45 % IV SOLN
INTRAVENOUS | Status: DC
  Administered 2018-09-18 – 2018-09-19 (×3): via INTRAVENOUS

## 2018-09-18 MED ORDER — KETOROLAC TROMETHAMINE 30 MG/ML IJ SOLN
30.0000 mg | INTRAMUSCULAR | Status: AC
  Administered 2018-09-18: 30 mg via INTRAVENOUS
  Filled 2018-09-18: qty 1

## 2018-09-18 MED ORDER — SODIUM CHLORIDE 0.9% FLUSH: 9.0000 mL | INTRAVENOUS | Status: DC | PRN

## 2018-09-18 MED ORDER — KETOROLAC TROMETHAMINE 30 MG/ML IJ SOLN
30.0000 mg | Freq: Once | INTRAMUSCULAR | Status: AC
  Administered 2018-09-19: 30 mg via INTRAVENOUS
  Filled 2018-09-18: qty 1

## 2018-09-18 MED ORDER — ACETAMINOPHEN 500 MG PO TABS
1000.0000 mg | ORAL_TABLET | Freq: Once | ORAL | Status: AC
  Administered 2018-09-18: 1000 mg via ORAL
  Filled 2018-09-18: qty 2

## 2018-09-18 MED ORDER — DIPHENHYDRAMINE HCL 50 MG/ML IJ SOLN
12.5000 mg | Freq: Four times a day (QID) | INTRAMUSCULAR | Status: DC | PRN
  Administered 2018-09-19: 12.5 mg via INTRAVENOUS
  Filled 2018-09-18: qty 1

## 2018-09-18 MED ORDER — NALOXONE HCL 0.4 MG/ML IJ SOLN: 0.4000 mg | INTRAMUSCULAR | Status: DC | PRN

## 2018-09-18 NOTE — H&P (Signed)
[facility] History and Physical   Date: 09/18/2018  Patient name: [name] Medical record number: [id number] Date of birth: 1991/01/15 Age: 28 y.o. Gender: female PCP: Patient, No Pcp Per  Attending physician: Tresa Garter, MD  Chief Complaint: Generalized pain  History of Present Illness: [name], a 28 year old female with a medical history significant for sickle cell anemia type SS, chronic pain syndrome, and anemia of chronic disease.  Patient was treated and evaluated in the emergency department agreed with ER provider that patient is appropriate to transition to sickle cell day infusion center that she is primarily been taking ibuprofen, and hydroxyurea patient has been incarcerated over the past 8 months and has not had.  States that pain is been worsening.  She attributes current pain crisis patient to transitioning to an environment outside the correctional institution.  To bilateral elbows, knee radiating along shin to foot falls or injury.  Cough, recent travel, or exposure to COVID-19 dysuria, nausea, vomiting, or diarrhea.  Meds: Medications Prior to Admission  Medication Sig Dispense Refill Last Dose  . folic acid (FOLVITE) 1 MG tablet Take 1 tablet (1 mg total) by mouth daily for 30 days. 30 tablet 0 09/18/2018 at Unknown time  . hydroxyurea (HYDREA) 500 MG capsule Take 1 capsule (500 mg total) by mouth 2 (two) times daily for 30 days. Take 2 tabs (1000 mg ) PO QOD starting on 06/02/2017 alternating with 1 tab (500 mg) PO QOD starting on 06/03/2017. May take with food to minimize GI side effects. (Patient taking differently: Take 500 mg by mouth 2 (two) times daily. ) 60 capsule 0 09/18/2018 at Unknown time  . ibuprofen (ADVIL) 800 MG tablet Take 1 tablet (800 mg total) by mouth every 8 (eight) hours as needed for moderate pain. 30 tablet 0 09/18/2018 at Unknown time    Allergies: Carrot [daucus carota] and Latex Past Medical History:  Diagnosis  Date  . Blood transfusion    "lots"  . Chronic back pain    "very severe; have knot in my back; from tight muscle; take RX and exercise for it"  . Depression 01/06/2011  . Exertional dyspnea    "sometimes"  . Genital HSV   . GERD (gastroesophageal reflux disease) 02/17/2011  . Migraines 11/08/11   "@ least twice/month"  . 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  . Mood swings 11/08/11   "I go back and forth; real bad"  . Sickle cell anemia ([name])   . Sickle cell anemia with crisis ([place])   . Thrombocytosis ([place]) 11/09/2015  . Trichotillomania    h/o   Past Surgical History:  Procedure Laterality Date  . CHOLECYSTECTOMY  05/2010  . DILATION AND CURETTAGE OF UTERUS  02/20/11   S/P miscarriage  . IR GENERIC HISTORICAL  12/23/2015   IR FLUORO GUIDE CV LINE RIGHT 12/23/2015 Nancy Cadet, MD [facility]  . IR GENERIC HISTORICAL  12/23/2015   IR US GUIDE VASC ACCESS RIGHT 12/23/2015 Nancy Cadet, MD [facility]   Family History  Problem Relation Age of Onset  . Sickle cell trait Mother   . Sickle cell trait Father   . Diabetes Maternal Grandmother   . Diabetes Paternal Grandmother   . Hypertension Paternal Grandmother   . Diabetes Maternal Grandfather    Social History   Socioeconomic History  . Marital status: Single    Spouse name: Not on file  . Number of children: Not on file  . Years of  education: Not on file  . Highest education level: Not on file  Occupational History  . Occupation: disabled  Social Needs  . Financial resource strain: Not on file  . Food insecurity:    Worry: Not on file    Inability: Not on file  . Transportation needs:    Medical: Not on file    Non-medical: Not on file  Tobacco Use  . Smoking status: Current Some Day Smoker    Packs/day: 0.25    Years: 1.00    Pack years: 0.25    Types: Cigarettes    Last attempt to quit: 03/25/2013    Years since quitting: 5.4  . Smokeless tobacco: Never Used  Substance and Sexual Activity   . Alcohol use: No    Comment: denies  . Drug use: No    Types: Other-see comments    Comment: "Clean for 3 months"  . Sexual activity: Yes    Partners: Male    Birth control/protection: None    Comment: last sex Jul 11 2015  Lifestyle  . Physical activity:    Days per week: Not on file    Minutes per session: Not on file  . Stress: Not on file  Relationships  . Social connections:    Talks on phone: Not on file    Gets together: Not on file    Attends religious service: Not on file    Active member of club or organization: Not on file    Attends meetings of clubs or organizations: Not on file    Relationship status: Not on file  . Intimate partner violence:    Fear of current or ex partner: Not on file    Emotionally abused: Not on file    Physically abused: Not on file    Forced sexual activity: Not on file  Other Topics Concern  . Not on file  Social History Narrative   Lives  With mother   FOB is supportive-supposed to be moving next month   Is a student at [employer] time   Review of Systems  Constitutional: Negative for chills, fever and weight loss.  HENT: Negative.   Eyes: Negative.   Respiratory: Negative.   Cardiovascular: Negative.   Genitourinary: Negative for dysuria and urgency.  Musculoskeletal: Positive for joint pain.  Skin: Negative.   Neurological: Negative.   Endo/Heme/Allergies: Negative.   Psychiatric/Behavioral: Negative.  Negative for depression, substance abuse and suicidal ideas.    Physical Exam: Blood pressure 117/67, pulse 94, temperature 98.4 F (36.9 C), temperature source Oral, resp. rate 15, weight 182 lb 15.7 oz (83 kg), last menstrual period 09/16/2018, SpO2 92 %. Physical Exam Constitutional:      Appearance: Normal appearance.  HENT:     Head: Normocephalic and atraumatic.     Mouth/Throat:     Mouth: Mucous membranes are moist.     Pharynx: Oropharynx is clear.  Eyes:     Pupils: Pupils are equal, round, and reactive to  light.  Cardiovascular:     Rate and Rhythm: Normal rate and regular rhythm.     Pulses: Normal pulses.     Heart sounds: Normal heart sounds.  Pulmonary:     Effort: Pulmonary effort is normal.     Breath sounds: Normal breath sounds.  Abdominal:     General: Abdomen is flat. Bowel sounds are normal.  Skin:    General: Skin is warm and dry.  Neurological:     General: No focal deficit present.  Mental Status: She is alert and oriented to person, place, and time.  Psychiatric:        Mood and Affect: Mood normal.        Behavior: Behavior normal.        Thought Content: Thought content normal.        Judgment: Judgment normal.      Lab results: Results for orders placed or performed during the hospital encounter of 09/18/18 (from the past 24 hour(s))  CBC     Status: Abnormal   Collection Time: 09/18/18  9:16 AM  Result Value Ref Range   WBC 12.4 (H) 4.0 - 10.5 K/uL   RBC 2.22 (L) 3.87 - 5.11 MIL/uL   Hemoglobin 7.4 (L) 12.0 - 15.0 g/dL   HCT 22.6 (L) 36.0 - 46.0 %   MCV 101.8 (H) 80.0 - 100.0 fL   MCH 33.3 26.0 - 34.0 pg   MCHC 32.7 30.0 - 36.0 g/dL   RDW 15.4 11.5 - 15.5 %   Platelets 432 (H) 150 - 400 K/uL   nRBC 1.0 (H) 0.0 - 0.2 %  Basic metabolic panel     Status: Abnormal   Collection Time: 09/18/18  9:16 AM  Result Value Ref Range   Sodium 136 135 - 145 mmol/L   Potassium 4.0 3.5 - 5.1 mmol/L   Chloride 107 98 - 111 mmol/L   CO2 24 22 - 32 mmol/L   Glucose, Bld 105 (H) 70 - 99 mg/dL   BUN 13 6 - 20 mg/dL   Creatinine, Ser 0.67 0.44 - 1.00 mg/dL   Calcium 7.8 (L) 8.9 - 10.3 mg/dL   GFR calc non Af Amer >60 >60 mL/min   GFR calc Af Amer >60 >60 mL/min   Anion gap 5 5 - 15  Reticulocytes     Status: Abnormal   Collection Time: 09/18/18  9:16 AM  Result Value Ref Range   Retic Ct Pct 10.6 (H) 0.4 - 3.1 %   RBC. 2.22 (L) 3.87 - 5.11 MIL/uL   Retic Count, Absolute 236.0 (H) 19.0 - 186.0 K/uL   Immature Retic Fract 41.4 (H) 2.3 - 15.9 %   *Note: Due to  a large number of results and/or encounters for the requested time period, some results have not been displayed. A complete set of results can be found in Results Review.    Imaging results:  No results found.   Assessment & Plan:  Patient will be admitted to the day infusion center for extended observation  Initiate 0.45% saline at 100 ml/hour for cellular rehydration  Toradol 15 mg IV times one for inflammation.  Tylenol 1000 mg times one  Start Dilaudid PCA High Concentration per weight based protocol.    Patient will be re-evaluated for pain intensity in the context of function and relationship to baseline as care progresses.  If no significant pain relief, will transfer patient to inpatient services for a higher level of care.    Reviewed all laboratory values prior to arrival, no additional labs warranted at this time.   Donia Pounds  APRN, MSN, FNP-C [facility] [address] [place], [place] [address] [phone]  09/18/2018, 3:19 PM

## 2018-09-18 NOTE — Progress Notes (Signed)
Patient admitted to the day hospital from [facility] for sickle cell pain crisis. Patient initially reported bilateral elbow and left knee pain rated 8/10. For pain management, patient placed on Full-Dose Dilaudid PCA, given 15 mg Toradol, 1000 mg Tylenol and hydrated with IV fluids. Patient's pain remained at 8/10. [place], [place] assessed patient and wrote orders for admission. Patient to be admitted to [place]. Report called to Maudie Mercury, RN on [place]. Patient transferred to unit in wheelchair on PCA. Vital signs WNL. Patient alert, oriented and stable at transfer.

## 2018-09-18 NOTE — H&P (Signed)
H&P  Patient Demographics:  [name], is a 28 y.o. female  MRN: [id number]   DOB - 1991/01/08  Admit Date - 09/18/2018  Outpatient Primary MD for the patient is Patient, No Pcp Per      HPI:   [name]  is a 28 y.o. female with a medical history significant for sickle cell disease, type SS, chronic pain syndrome, and anemia of chronic disease presents complaining of generalized pain is consistent with typical sickle cell pain crisis.  Patient transitioned to sickle cell day infusion center from the emergency department this a.m. patient states that pain is primarily to bilateral elbows, left knee radiating to shin and foot.  Patient has been taking Tylenol and ibuprofen at home without sustained relief.  Patient has been off of opiate medications for greater than 8 months due to recent incarceration.  Patient states that her car serviced and ended 7 days ago.  She attributes current pain crisis to readjusting to home environment.  Pain intensity on admission 7-8/10 characterized as constant and throbbing. Patient currently denies headache, persistent cough, shortness of breath, chest pain, nausea, vomiting, or diarrhea.  She also denies recent travel, sick contacts, or exposure to COVID-19.  Sickle cell day infusion center course: Hemoglobin 7.4, consistent with patient's baseline.  WBCs 12.4.  Patient afebrile and maintaining oxygen saturation at 95% on 2 L. Pain persists despite IV Dilaudid via full dose PCA, IV Toradol, Tylenol by mouth, and IV fluids.  Patient admitted to [facility] for sickle cell pain crisis.   Review of systems:  Review of Systems  Constitutional: Negative for chills and fever.  HENT: Negative for hearing loss and tinnitus.   Eyes: Negative for blurred vision and double vision.  Respiratory: Negative.   Cardiovascular: Negative.   Gastrointestinal: Negative.   Genitourinary: Negative for dysuria and urgency.  Musculoskeletal: Positive for joint pain.  Skin:  Negative.   Neurological: Negative for dizziness and headaches.  Endo/Heme/Allergies: Negative.   Psychiatric/Behavioral: Negative.  Negative for depression and suicidal ideas.    A full 10 point Review of Systems was done, except as stated above, all other Review of Systems were negative.  With Past History of the following :   Past Medical History:  Diagnosis Date  . Blood transfusion    "lots"  . Chronic back pain    "very severe; have knot in my back; from tight muscle; take RX and exercise for it"  . Depression 01/06/2011  . Exertional dyspnea    "sometimes"  . Genital HSV   . GERD (gastroesophageal reflux disease) 02/17/2011  . Migraines 11/08/11   "@ least twice/month"  . 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  . Mood swings 11/08/11   "I go back and forth; real bad"  . Sickle cell anemia ([name])   . Sickle cell anemia with crisis ([place])   . Thrombocytosis ([place]) 11/09/2015  . Trichotillomania    h/o      Past Surgical History:  Procedure Laterality Date  . CHOLECYSTECTOMY  05/2010  . DILATION AND CURETTAGE OF UTERUS  02/20/11   S/P miscarriage  . IR GENERIC HISTORICAL  12/23/2015   IR FLUORO GUIDE CV LINE RIGHT 12/23/2015 Jacqulynn Cadet, MD [facility]  . IR GENERIC HISTORICAL  12/23/2015   IR US GUIDE VASC ACCESS RIGHT 12/23/2015 Jacqulynn Cadet, MD [facility]     Social History:   Social History   Tobacco Use  . Smoking status: Current Some Day Smoker  Packs/day: 0.25    Years: 1.00    Pack years: 0.25    Types: Cigarettes    Last attempt to quit: 03/25/2013    Years since quitting: 5.4  . Smokeless tobacco: Never Used  Substance Use Topics  . Alcohol use: No    Comment: denies     Lives - At home   Family History :   Family History  Problem Relation Age of Onset  . Sickle cell trait Mother   . Sickle cell trait Father   . Diabetes Maternal Grandmother   . Diabetes Paternal Grandmother   . Hypertension Paternal Grandmother   .  Diabetes Maternal Grandfather      Home Medications:   Prior to Admission medications   Medication Sig Start Date End Date Taking? Authorizing Provider  folic acid (FOLVITE) 1 MG tablet Take 1 tablet (1 mg total) by mouth daily for 30 days. 09/17/18 10/17/18 Yes Long, Wonda Olds, MD  hydroxyurea (HYDREA) 500 MG capsule Take 1 capsule (500 mg total) by mouth 2 (two) times daily for 30 days. Take 2 tabs (1000 mg ) PO QOD starting on 06/02/2017 alternating with 1 tab (500 mg) PO QOD starting on 06/03/2017. May take with food to minimize GI side effects. Patient taking differently: Take 500 mg by mouth 2 (two) times daily.  09/17/18 10/17/18 Yes Long, Wonda Olds, MD  ibuprofen (ADVIL) 800 MG tablet Take 1 tablet (800 mg total) by mouth every 8 (eight) hours as needed for moderate pain. 09/17/18  Yes Long, Wonda Olds, MD     Allergies:   Allergies  Allergen Reactions  . Carrot [Daucus Carota] Hives and Swelling    Allergic to carrots  . Latex Rash     Physical Exam:   Vitals:   Vitals:   09/18/18 1240 09/18/18 1520  BP: 117/67 129/61  Pulse: 94 99  Resp: 15 16  Temp:  98.4 F (36.9 C)  SpO2: 92% 97%    Physical Exam: Constitutional: Patient appears well-developed and well-nourished. Not in obvious distress. HENT: Normocephalic, atraumatic, External right and left ear normal. Oropharynx is clear and moist.  Eyes: Conjunctivae and EOM are normal. PERRLA, no scleral icterus. Neck: Normal ROM. Neck supple. No JVD. No tracheal deviation. No thyromegaly. CVS: RRR, S1/S2 +, no murmurs, no gallops, no carotid bruit.  Pulmonary: Effort and breath sounds normal, no stridor, rhonchi, wheezes, rales.  Abdominal: Soft. BS +, no distension, tenderness, rebound or guarding.  Musculoskeletal: Normal range of motion. No edema and no tenderness.  Lymphadenopathy: No lymphadenopathy noted, cervical, inguinal or axillary Neuro: Alert. Normal reflexes, muscle tone coordination. No cranial nerve deficit. Skin:  Skin is warm and dry. No rash noted. Not diaphoretic. No erythema. No pallor. Psychiatric: Normal mood and affect. Behavior, judgment, thought content normal.   Data Review:   CBC Recent Labs  Lab 09/17/18 1550 09/18/18 0916  WBC 9.9 12.4*  HGB 7.7* 7.4*  HCT 22.4* 22.6*  PLT 450* 432*  MCV 101.4* 101.8*  MCH 34.8* 33.3  MCHC 34.4 32.7  RDW 15.0 15.4  LYMPHSABS 1.9  --   MONOABS 1.2*  --   EOSABS 0.4  --   BASOSABS 0.0  --    ------------------------------------------------------------------------------------------------------------------  Chemistries  Recent Labs  Lab 09/17/18 1550 09/18/18 0916  NA 142 136  K 3.4* 4.0  CL 111 107  CO2 24 24  GLUCOSE 109* 105*  BUN 10 13  CREATININE 0.62 0.67  CALCIUM 8.4* 7.8*  AST 23  --  ALT 29  --   ALKPHOS 55  --   BILITOT 1.9*  --    ------------------------------------------------------------------------------------------------------------------ CrCl cannot be calculated (Unknown ideal weight.). ------------------------------------------------------------------------------------------------------------------ No results for input(s): TSH, T4TOTAL, T3FREE, THYROIDAB in the last 72 hours.  Invalid input(s): FREET3  Coagulation profile No results for input(s): INR, PROTIME in the last 168 hours. ------------------------------------------------------------------------------------------------------------------- No results for input(s): DDIMER in the last 72 hours. -------------------------------------------------------------------------------------------------------------------  Cardiac Enzymes No results for input(s): CKMB, TROPONINI, MYOGLOBIN in the last 168 hours.  Invalid input(s): CK ------------------------------------------------------------------------------------------------------------------ No results found for:  BNP  ---------------------------------------------------------------------------------------------------------------  Urinalysis    Component Value Date/Time   COLORURINE COLORLESS (A) 11/21/2017 0400   APPEARANCEUR CLEAR 11/21/2017 0400   APPEARANCEUR Clear 05/06/2012 1546   LABSPEC 1.000 (L) 11/21/2017 0400   LABSPEC 1.011 05/06/2012 1546   PHURINE 6.0 11/21/2017 0400   GLUCOSEU NEGATIVE 11/21/2017 0400   GLUCOSEU Negative 05/06/2012 1546   HGBUR NEGATIVE 11/21/2017 0400   HGBUR negative 01/08/2009 1329   BILIRUBINUR NEGATIVE 11/21/2017 0400   BILIRUBINUR negative 10/18/2017 1432   BILIRUBINUR Negative 05/06/2012 1546   KETONESUR NEGATIVE 11/21/2017 0400   PROTEINUR NEGATIVE 11/21/2017 0400   UROBILINOGEN 0.2 10/18/2017 1432   UROBILINOGEN 0.2 07/24/2017 1347   NITRITE NEGATIVE 11/21/2017 0400   LEUKOCYTESUR NEGATIVE 11/21/2017 0400   LEUKOCYTESUR Negative 05/06/2012 1546    ----------------------------------------------------------------------------------------------------------------   Imaging Results:    No results found.    Assessment & Plan:  Active Problems:   Sickle cell pain crisis ([name])   Chronic pain   Leucocytosis   Sickle Cell Disease with crisis:  Admit, start IVF 0.45% saline at 100 mL/h Full dose Dilaudid PCA with settings 0.3 mg, 8-minute lockout, and 1.25 mg/h. IV Toradol 18 mg Q 6 H, Monitor vitals very closely, Re-evaluate pain scale regularly, 2 L of Oxygen by [place], Patient will be re-evaluated for pain in the context of function and relationship to baseline as care progresses.  Leukocytosis:  WBCs 12.4, patient afebrile, no signs of infection, suspected to be reactive. CBC in a.m.  Sickle Cell Anemia:  Hemoglobin 7.4, consistent with patient's baseline.  No clinical indication for blood transfusion at this time. Continue folic acid 1 mg daily Continue hydroxyurea 500 mg twice daily  DVT Prophylaxis: Subcut Lovenox   AM Labs Ordered, also  please review Full Orders  Family Communication: Admission, patient's condition and plan of care including tests being ordered have been discussed with the patient who indicate understanding and agree with the plan and Code Status.  Code Status: Full Code  Consults called: None    Admission status: Inpatient    Time spent in minutes : 50 minutes  [place], MSN, FNP-C [facility] [address] [place], [place] [address] [phone]  09/18/2018 at 3:39 PM

## 2018-09-18 NOTE — ED Provider Notes (Signed)
[facility] DEPT Provider Note   CSN: [id number] Arrival date & time: 09/17/18  1500    History   Chief Complaint Chief Complaint  Patient presents with  . Elbow Pain  . Sickle Cell Pain Crisis  . Medication Refill    HPI [name] is a 28 y.o. female.     HPI 28 year old female comes in a chief complaint of bilateral elbow pain. Patient has history of sickle cell anemia.  She reports that she started having pain in her elbows 2 days ago.  Normally she takes Tylenol and ibuprofen for her pain.  Patient did not get significant pain relief, therefore she decided to come to the ER.  Patient is unsure as to what provoked her sickle cell pain crisis.  She denies nausea, vomiting, chest pain, cough, fevers.  Patient sickle cell is well controlled and her last ER visit was more than 2 months ago.   Past Medical History:  Diagnosis Date  . Blood transfusion    "lots"  . Chronic back pain    "very severe; have knot in my back; from tight muscle; take RX and exercise for it"  . Depression 01/06/2011  . Exertional dyspnea    "sometimes"  . Genital HSV   . GERD (gastroesophageal reflux disease) 02/17/2011  . Migraines 11/08/11   "@ least twice/month"  . 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  . Mood swings 11/08/11   "I go back and forth; real bad"  . Sickle cell anemia ([name])   . Sickle cell anemia with crisis ([place])   . Thrombocytosis ([place]) 11/09/2015  . Trichotillomania    h/o    Patient Active Problem List   Diagnosis Date Noted  . Sickle cell anemia with crisis ([place]) 12/22/2017  . 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 Chest pain 09/29/2017  . Sickle cell crisis ([place]) 09/29/2017  . Leucocytosis   . 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 Chronic anticoagulation   . Sickle cell anemia ([place]) 05/19/2016  . History of DVT (deep vein thrombosis) 04/17/2016  . Thrombocytosis ([place]) 11/09/2015  . Hyperbilirubinemia 11/09/2015   . Substance abuse ([place]) 09/26/2015  . Chronic pain 08/04/2015  . Herpes simplex 07/14/2015  . Anemia of chronic disease   . Sickle cell pain crisis ([place]) 03/29/2012  . 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 CHOLECYSTECTOMY  05/2010  . DILATION AND CURETTAGE OF UTERUS  02/20/11   S/P miscarriage  . IR GENERIC HISTORICAL  12/23/2015   IR FLUORO GUIDE CV LINE RIGHT 12/23/2015 Jacqulynn Cadet, MD [facility]  . IR GENERIC HISTORICAL  12/23/2015   IR US GUIDE VASC ACCESS RIGHT 12/23/2015 Jacqulynn Cadet, MD [facility]     OB History    Gravida  5   Para  2   Term  2   Preterm  0   AB  3   Living  2     SAB  3   TAB  0   Ectopic  0   Multiple  0   Live Births  2        Obstetric Comments  Miscarried in October 2012 at about 7 weeks         Home Medications    Prior to Admission medications   Medication Sig Start Date End Date Taking? Authorizing Provider  folic  acid (FOLVITE) 1 MG tablet Take 1 tablet (1 mg total) by mouth daily for 30 days. 09/17/18 10/17/18  Long, Wonda Olds, MD  hydroxyurea (HYDREA) 500 MG capsule Take 1 capsule (500 mg total) by mouth 2 (two) times daily for 30 days. Take 2 tabs (1000 mg ) PO QOD starting on 06/02/2017 alternating with 1 tab (500 mg) PO QOD starting on 06/03/2017. May take with food to minimize GI side effects. Patient taking differently: Take 500 mg by mouth 2 (two) times daily.  09/17/18 10/17/18  Long, Wonda Olds, MD  ibuprofen (ADVIL) 800 MG tablet Take 1 tablet (800 mg total) by mouth every 8 (eight) hours as needed for moderate pain. 09/17/18   Long, Wonda Olds, MD    Family History Family History  Problem Relation Age of Onset  . Sickle cell trait Mother   . Sickle cell trait Father   . Diabetes Maternal Grandmother   . Diabetes Paternal Grandmother   . Hypertension Paternal Grandmother   .  Diabetes Maternal Grandfather     Social History Social History   Tobacco Use  . Smoking status: Current Some Day Smoker    Packs/day: 0.25    Years: 1.00    Pack years: 0.25    Types: Cigarettes    Last attempt to quit: 03/25/2013    Years since quitting: 5.4  . Smokeless tobacco: Never Used  Substance Use Topics  . Alcohol use: No    Comment: denies  . Drug use: No    Types: Other-see comments    Comment: "Clean for 3 months"     Allergies   Carrot [daucus carota] and Latex   Review of Systems Review of Systems  Constitutional: Positive for activity change.  Musculoskeletal: Positive for arthralgias and myalgias.  All other systems reviewed and are negative.    Physical Exam Updated Vital Signs BP 101/66   Pulse (!) 102   Temp 98.5 F (36.9 C) (Oral)   Resp 16   Wt 83 kg   LMP 09/16/2018   SpO2 98%   BMI 34.58 kg/m   Physical Exam Vitals signs and nursing note reviewed.  Constitutional:      Appearance: She is well-developed.  HENT:     Head: Normocephalic and atraumatic.  Neck:     Musculoskeletal: Normal range of motion and neck supple.  Cardiovascular:     Rate and Rhythm: Normal rate.  Pulmonary:     Effort: Pulmonary effort is normal.  Abdominal:     General: Bowel sounds are normal.  Skin:    General: Skin is warm and dry.  Neurological:     Mental Status: She is alert and oriented to person, place, and time.      ED Treatments / Results  Labs (all labs ordered are listed, but only abnormal results are displayed) Labs Reviewed  CBC WITH DIFFERENTIAL/PLATELET - Abnormal; Notable for the following components:      Result Value   RBC 2.21 (*)    Hemoglobin 7.7 (*)    HCT 22.4 (*)    MCV 101.4 (*)    MCH 34.8 (*)    Platelets 450 (*)    nRBC 1.3 (*)    Monocytes Absolute 1.2 (*)    All other components within normal limits  RETICULOCYTES - Abnormal; Notable for the following components:   Retic Ct Pct 10.3 (*)    RBC. 2.21  (*)    Retic Count, Absolute 227.9 (*)    Immature Retic  Fract 38.0 (*)    All other components within normal limits  COMPREHENSIVE METABOLIC PANEL - Abnormal; Notable for the following components:   Potassium 3.4 (*)    Glucose, Bld 109 (*)    Calcium 8.4 (*)    Total Bilirubin 1.9 (*)    All other components within normal limits  I-STAT BETA HCG BLOOD, ED ([facility], [facility], [facility] ONLY)    EKG None  Radiology No results found.  Procedures Procedures (including critical care time)  Medications Ordered in ED Medications  0.45 % sodium chloride infusion ( Intravenous Stopped 09/17/18 1752)  HYDROmorphone (DILAUDID) injection 0.5 mg ( Intravenous See Alternative 09/17/18 1640)    Or  HYDROmorphone (DILAUDID) injection 0.5 mg (0.5 mg Subcutaneous Given 09/17/18 1640)  HYDROmorphone (DILAUDID) injection 1 mg (1 mg Intravenous Given 09/17/18 1717)    Or  HYDROmorphone (DILAUDID) injection 1 mg ( Subcutaneous See Alternative 09/17/18 1717)  HYDROmorphone (DILAUDID) injection 1 mg (1 mg Intravenous Given 09/17/18 1750)    Or  HYDROmorphone (DILAUDID) injection 1 mg ( Subcutaneous See Alternative 09/17/18 1750)  diphenhydrAMINE (BENADRYL) injection 25 mg (25 mg Intravenous Given 09/17/18 1640)     Initial Impression / Assessment and Plan / ED Course  I have reviewed the triage vital signs and the nursing notes.  Pertinent labs & imaging results that were available during my care of the patient were reviewed by me and considered in my medical decision making (see chart for details).       28 year old female comes in a chief complaint of sickle cell pain.  Her pain is in bilateral elbow, and the exam of the elbow does not reveal any calor, rubor, significant swelling or compromise in range of motion.  There is tenderness to palpation however.  Patient states that she normally gets pain in her elbows with her sickle cell pain crisis.  She is opioid nave therefore we will give her Dilaudid that is based on  the opioid nave sickle cell pain protocol. IV fluid has been started.  Patient will be followed up by Dr. Laverta Baltimore.  Final Clinical Impressions(s) / ED Diagnoses   Final diagnoses:  Sickle cell crisis [facility])    ED Discharge Orders         Ordered    hydroxyurea (HYDREA) 500 MG capsule  2 times daily     98/92/11 9417    folic acid (FOLVITE) 1 MG tablet  Daily     09/17/18 1751    ibuprofen (ADVIL) 800 MG tablet  Every 8 hours PRN     09/17/18 [place], Jerene Yeager, MD 09/18/18 1550

## 2018-09-18 NOTE — ED Provider Notes (Signed)
[facility] DEPT Provider Note   CSN: [id number] Arrival date & time: 09/18/18  0820    History   Chief Complaint Chief Complaint  Patient presents with  . Sickle Cell Pain Crisis  . Knee Pain    left    HPI [name] is a 28 y.o. female for evaluation of sickle cell pain crisis.  Patient states she has been having bilateral elbow pain and left knee pain radiating along her shin to her foot.  Pain is severe.  She has been taking ibuprofen for this, last dose at 5 AM.  Patient states she has no narcotics at home for her sickle cell pain crisis.  She was seen yesterday, but reports worsening of symptoms since.  She denies fevers, chills, chest pain, shortness of breath, nausea, vomiting, abdominal pain, urinary symptoms, abnormal bowel movements.  She denies fall, trauma, or injury.  She denies known precipitating event.  Patient states this feels consistent with her normal sickle cell pain crises.  She denies extremity swelling or edema.  Social history obtained from chart review.  Patient seen yesterday, labs from yesterday reviewed.  Hemoglobin was low at 7.7, but at patient's baseline.  Otherwise reassuring.     HPI  Past Medical History:  Diagnosis Date  . Blood transfusion    "lots"  . Chronic back pain    "very severe; have knot in my back; from tight muscle; take RX and exercise for it"  . Depression 01/06/2011  . Exertional dyspnea    "sometimes"  . Genital HSV   . GERD (gastroesophageal reflux disease) 02/17/2011  . Migraines 11/08/11   "@ least twice/month"  . 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  . Mood swings 11/08/11   "I go back and forth; real bad"  . Sickle cell anemia ([name])   . Sickle cell anemia with crisis ([place])   . Thrombocytosis ([place]) 11/09/2015  . Trichotillomania    h/o    Patient Active Problem List   Diagnosis Date Noted  . Sickle cell anemia with crisis ([place]) 12/22/2017  . 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 Chest pain 09/29/2017  . Sickle cell crisis ([place]) 09/29/2017  . Leucocytosis   . 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 Chronic anticoagulation   . Sickle cell anemia ([place]) 05/19/2016  . History of DVT (deep vein thrombosis) 04/17/2016  . Thrombocytosis ([place]) 11/09/2015  . Hyperbilirubinemia 11/09/2015  . Substance abuse ([place]) 09/26/2015  . Chronic pain 08/04/2015  . Herpes simplex 07/14/2015  . Anemia of chronic disease   . Sickle cell pain crisis ([place]) 03/29/2012  . 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 Hb-SS disease without crisis ([place]) 03/28/1992    Past Surgical History:  Procedure Laterality Date  . CHOLECYSTECTOMY  05/2010  . DILATION AND CURETTAGE OF UTERUS  02/20/11   S/P miscarriage  . IR GENERIC HISTORICAL  12/23/2015   IR FLUORO GUIDE CV LINE RIGHT 12/23/2015 Jacqulynn Cadet, MD [facility]  . IR GENERIC HISTORICAL  12/23/2015   IR US GUIDE VASC ACCESS RIGHT 12/23/2015 Jacqulynn Cadet, MD [facility]     OB History    Gravida  5   Para  2   Term  2   Preterm  0   AB  3   Living  2     SAB  3   TAB  0   Ectopic  0   Multiple  0  Live Births  2        Obstetric Comments  Miscarried in October 2012 at about 7 weeks         Home Medications    Prior to Admission medications   Medication Sig Start Date End Date Taking? Authorizing Provider  folic acid (FOLVITE) 1 MG tablet Take 1 tablet (1 mg total) by mouth daily for 30 days. 09/17/18 10/17/18 Yes Long, Wonda Olds, MD  hydroxyurea (HYDREA) 500 MG capsule Take 1 capsule (500 mg total) by mouth 2 (two) times daily for 30 days. Take 2 tabs (1000 mg ) PO QOD starting on 06/02/2017 alternating with 1 tab (500 mg) PO QOD starting on 06/03/2017. May take with food to minimize GI side effects. Patient taking differently: Take 500 mg by mouth 2 (two) times daily.  09/17/18 10/17/18 Yes Long, Wonda Olds, MD  ibuprofen (ADVIL) 800 MG tablet Take 1 tablet (800 mg  total) by mouth every 8 (eight) hours as needed for moderate pain. 09/17/18  Yes Long, Wonda Olds, MD    Family History Family History  Problem Relation Age of Onset  . Sickle cell trait Mother   . Sickle cell trait Father   . Diabetes Maternal Grandmother   . Diabetes Paternal Grandmother   . Hypertension Paternal Grandmother   . Diabetes Maternal Grandfather     Social History Social History   Tobacco Use  . Smoking status: Current Some Day Smoker    Packs/day: 0.25    Years: 1.00    Pack years: 0.25    Types: Cigarettes    Last attempt to quit: 03/25/2013    Years since quitting: 5.4  . Smokeless tobacco: Never Used  Substance Use Topics  . Alcohol use: No    Comment: denies  . Drug use: No    Types: Other-see comments    Comment: "Clean for 3 months"     Allergies   Carrot [daucus carota] and Latex   Review of Systems Review of Systems  Musculoskeletal: Positive for arthralgias and myalgias.  All other systems reviewed and are negative.    Physical Exam Updated Vital Signs BP 106/76   Pulse 91   Temp 98.6 F (37 C) (Oral)   Resp 16   LMP 09/16/2018   SpO2 96%   Physical Exam Vitals signs and nursing note reviewed.  Constitutional:      General: She is not in acute distress.    Appearance: She is well-developed.  HENT:     Head: Normocephalic and atraumatic.  Eyes:     Conjunctiva/sclera: Conjunctivae normal.     Pupils: Pupils are equal, round, and reactive to light.  Neck:     Musculoskeletal: Normal range of motion and neck supple.  Cardiovascular:     Rate and Rhythm: Normal rate and regular rhythm.  Pulmonary:     Effort: Pulmonary effort is normal. No respiratory distress.     Breath sounds: Normal breath sounds. No wheezing.  Abdominal:     General: There is no distension.     Palpations: Abdomen is soft.     Tenderness: There is no abdominal tenderness.  Musculoskeletal: Normal range of motion.     Comments: Tenderness to palpation  of bilateral elbows and left knee, shin, and ankle.  No obvious swelling.  No warmth, erythema, or signs of septic joint.  Radial pedal pulses intact.  Strength intact. No ttp of the calf  Skin:    General: Skin is warm and dry.  Capillary Refill: Capillary refill takes less than 2 seconds.  Neurological:     Mental Status: She is alert and oriented to person, place, and time.      ED Treatments / Results  Labs (all labs ordered are listed, but only abnormal results are displayed) Labs Reviewed  CBC - Abnormal; Notable for the following components:      Result Value   WBC 12.4 (*)    RBC 2.22 (*)    Hemoglobin 7.4 (*)    HCT 22.6 (*)    MCV 101.8 (*)    Platelets 432 (*)    nRBC 1.0 (*)    All other components within normal limits  BASIC METABOLIC PANEL - Abnormal; Notable for the following components:   Glucose, Bld 105 (*)    Calcium 7.8 (*)    All other components within normal limits  RETICULOCYTES - Abnormal; Notable for the following components:   Retic Ct Pct 10.6 (*)    RBC. 2.22 (*)    Retic Count, Absolute 236.0 (*)    Immature Retic Fract 41.4 (*)    All other components within normal limits    EKG None  Radiology No results found.  Procedures Procedures (including critical care time)  Medications Ordered in ED Medications  ketorolac (TORADOL) 30 MG/ML injection 30 mg (30 mg Intravenous Given 09/18/18 0904)  HYDROmorphone (DILAUDID) injection 2 mg (2 mg Intravenous Given 09/18/18 0947)    Or  HYDROmorphone (DILAUDID) injection 2 mg ( Subcutaneous See Alternative 09/18/18 0947)  HYDROmorphone (DILAUDID) injection 2 mg (2 mg Intravenous Given 09/18/18 0906)    Or  HYDROmorphone (DILAUDID) injection 2 mg ( Subcutaneous See Alternative 09/18/18 0906)  sodium chloride 0.45 % bolus 1,000 mL (0 mLs Intravenous Stopped 09/18/18 1029)  HYDROmorphone (DILAUDID) injection 2 mg (2 mg Intravenous Given 09/18/18 1028)     Initial Impression / Assessment and Plan / ED  Course  I have reviewed the triage vital signs and the nursing notes.  Pertinent labs & imaging results that were available during my care of the patient were reviewed by me and considered in my medical decision making (see chart for details).        Pt presenting for evaluation of elbow and knee pain.  Physical exam reassuring, she appears nontoxic.  She is afebrile without cough, chest pain, shortness of breath.  Doubt acute chest.  No fevers, joint swelling, erythema, or signs of septic joint.  No tenderness palpation of the calf, low suspicion for DVT.  Likely sickle cell pain crisis.  Will start fluids and pain control.  Patient's blood pressure slightly low on arrival, 90 systolic.  As such, will recheck labs to ensure no significant anemia.  Labs reassuring, similar to yesterday.  Hemoglobin low at 7.4, but this is patient's baseline.  Blood pressure improving with fluids.  After 2 doses, patient reports no improvement of pain.  Discussed possibility of third dose and admission versus treatment at the sickle cell clinic and reassessment.  Patient is trying to avoid admission, as such we will transfer patient to sickle cell clinic.  Discussed with [place] Hollis, NP, and patient accepted in transfer.  Final Clinical Impressions(s) / ED Diagnoses   Final diagnoses:  Sickle cell pain crisis [facility])    ED Discharge Orders    None       Franchot Heidelberg, PA-C 09/18/18 1109    Lennice Sites, DO 09/18/18 1504

## 2018-09-18 NOTE — ED Triage Notes (Signed)
Pt c/o bilat elbow and left knee sickle cell pains that got worse after leaving here last night. Reports taken her ibuprofen but hasnt helped.

## 2018-09-19 LAB — COMPREHENSIVE METABOLIC PANEL
ALT: 59 U/L — ABNORMAL HIGH (ref 0–44)
AST: 40 U/L (ref 15–41)
Albumin: 4 g/dL (ref 3.5–5.0)
Alkaline Phosphatase: 50 U/L (ref 38–126)
Anion gap: 6 (ref 5–15)
BUN: 10 mg/dL (ref 6–20)
CO2: 25 mmol/L (ref 22–32)
Calcium: 8.2 mg/dL — ABNORMAL LOW (ref 8.9–10.3)
Chloride: 107 mmol/L (ref 98–111)
Creatinine, Ser: 0.65 mg/dL (ref 0.44–1.00)
GFR calc Af Amer: >60 mL/min (ref >60)
GFR calc non Af Amer: >60 mL/min (ref >60)
Glucose, Bld: 114 mg/dL — ABNORMAL HIGH (ref 70–99)
Potassium: 3.8 mmol/L (ref 3.5–5.1)
Sodium: 138 mmol/L (ref 135–145)
Total Bilirubin: 3.9 mg/dL — ABNORMAL HIGH (ref 0.3–1.2)
Total Protein: 6.9 g/dL (ref 6.5–8.1)

## 2018-09-19 LAB — CBC
HCT: 18.6 % — ABNORMAL LOW (ref 36.0–46.0)
Hemoglobin: 6.4 g/dL — CL (ref 12.0–15.0)
MCH: 34.4 pg — ABNORMAL HIGH (ref 26.0–34.0)
MCHC: 34.4 g/dL (ref 30.0–36.0)
MCV: 100 fL (ref 80.0–100.0)
Platelets: 409 10*3/uL — ABNORMAL HIGH (ref 150–400)
RBC: 1.86 MIL/uL — ABNORMAL LOW (ref 3.87–5.11)
RDW: 16.1 % — ABNORMAL HIGH (ref 11.5–15.5)
WBC: 14.9 10*3/uL — ABNORMAL HIGH (ref 4.0–10.5)
nRBC: 1.8 % — ABNORMAL HIGH (ref 0.0–0.2)

## 2018-09-19 MED ORDER — ACETAMINOPHEN 500 MG PO TABS
1000.0000 mg | ORAL_TABLET | Freq: Four times a day (QID) | ORAL | Status: DC | PRN
  Administered 2018-09-19 – 2018-09-20 (×3): 1000 mg via ORAL
  Filled 2018-09-19 (×3): qty 2

## 2018-09-19 MED ORDER — SODIUM CHLORIDE 0.9% FLUSH: 10.0000 mL | Freq: Two times a day (BID) | INTRAVENOUS | Status: DC

## 2018-09-19 MED ORDER — OXYCODONE HCL 5 MG PO TABS
10.0000 mg | ORAL_TABLET | ORAL | Status: DC
  Filled 2018-09-19: qty 2

## 2018-09-19 MED ORDER — ONDANSETRON HCL 4 MG/2ML IJ SOLN
4.0000 mg | Freq: Four times a day (QID) | INTRAMUSCULAR | Status: DC | PRN
  Administered 2018-09-19: 4 mg via INTRAVENOUS
  Filled 2018-09-19: qty 2

## 2018-09-19 MED ORDER — SODIUM CHLORIDE 0.9% FLUSH: 10.0000 mL | INTRAVENOUS | Status: DC | PRN

## 2018-09-19 MED ORDER — SODIUM CHLORIDE 0.9% FLUSH
10.0000 mL | Freq: Two times a day (BID) | INTRAVENOUS | Status: DC
  Administered 2018-09-19: 22:00:00 10 mL

## 2018-09-19 MED ORDER — KETOROLAC TROMETHAMINE 30 MG/ML IJ SOLN
15.0000 mg | Freq: Four times a day (QID) | INTRAMUSCULAR | Status: DC
  Administered 2018-09-19 – 2018-09-20 (×5): 15 mg via INTRAVENOUS
  Filled 2018-09-19 (×5): qty 1

## 2018-09-19 NOTE — Progress Notes (Signed)
Subjective: [name], a 28 year old female with a medical history significant for sickle cell disease, type SS, chronic pain syndrome, and anemia of chronic disease was admitted in sickle cell pain crisis.   Patient complains of severe pain primarily to left knee. Pain intensity is 7/10 characterized as constant and throbbing. She says that left knee pain is worsened with standing and ambulation.   Patient afebrile and maintaining oxygen saturation above 90%.   Patient denies headache, chest pain, shortness of breath, dysuria, nausea, vomiting, or diarrhea.   Objective:  Vital signs in last 24 hours:  Vitals:   09/19/18 0815 09/19/18 1121 09/19/18 1344 09/19/18 1513  BP:   114/73   Pulse:   (!) 119   Resp: 17 14  13   Temp:   98.7 F (37.1 C)   TempSrc:   Oral   SpO2: 99%  (!) 89%   Weight:      Height:        Intake/Output from previous day:   Intake/Output Summary (Last 24 hours) at 09/19/2018 1616 Last data filed at 09/19/2018 1549 Gross per 24 hour  Intake 2953.89 ml  Output 1601 ml  Net 1352.89 ml    Physical Exam: General: Alert, awake, oriented x3, in no acute distress.  HEENT: [place]/AT PEERL, EOMI Neck: Trachea midline,  no masses, no thyromegal,y no JVD, no carotid bruit OROPHARYNX:  Moist, No exudate/ erythema/lesions.  Heart: Regular rate and rhythm, without murmurs, rubs, gallops, PMI non-displaced, no heaves or thrills on palpation.  Lungs: Clear to auscultation, no wheezing or rhonchi noted. No increased vocal fremitus resonant to percussion  Abdomen: Soft, nontender, nondistended, positive bowel sounds, no masses no hepatosplenomegaly noted..  Neuro: No focal neurological deficits noted cranial nerves II through XII grossly intact. DTRs 2+ bilaterally upper and lower extremities. Strength 5 out of 5 in bilateral upper and lower extremities. Musculoskeletal: No warm swelling or erythema around joints, no spinal tenderness noted. Psychiatric: Patient alert  and oriented x3, good insight and cognition, good recent to remote recall. Lymph node survey: No cervical axillary or inguinal lymphadenopathy noted.  Lab Results:  Basic Metabolic Panel:    Component Value Date/Time   NA 138 09/19/2018 1012   NA 143 07/24/2017 1416   NA 139 05/06/2012 1645   K 3.8 09/19/2018 1012   K 3.9 05/06/2012 1645   CL 107 09/19/2018 1012   CL 110 (H) 05/06/2012 1645   CO2 25 09/19/2018 1012   CO2 22 05/06/2012 1645   BUN 10 09/19/2018 1012   BUN 3 (L) 07/24/2017 1416   BUN 9 05/06/2012 1645   CREATININE 0.65 09/19/2018 1012   CREATININE 0.43 (L) 07/06/2015 1356   GLUCOSE 114 (H) 09/19/2018 1012   GLUCOSE 84 05/06/2012 1645   CALCIUM 8.2 (L) 09/19/2018 1012   CALCIUM 8.3 (L) 05/06/2012 1645   CBC:    Component Value Date/Time   WBC 14.9 (H) 09/19/2018 1012   HGB 6.4 (LL) 09/19/2018 1012   HGB 9.3 (L) 07/24/2017 1421   HCT 18.6 (L) 09/19/2018 1012   HCT 28.9 (L) 07/24/2017 1421   PLT 409 (H) 09/19/2018 1012   PLT 666 (H) 07/24/2017 1421   MCV 100.0 09/19/2018 1012   MCV 83 07/24/2017 1421   MCV 94 05/11/2012 0721   NEUTROABS 6.2 09/17/2018 1550   NEUTROABS 5.4 07/24/2017 1421   NEUTROABS 10.9 (H) 05/10/2012 0552   LYMPHSABS 1.9 09/17/2018 1550   LYMPHSABS 1.5 07/24/2017 1421   LYMPHSABS 3.5 05/10/2012  0552   MONOABS 1.2 (H) 09/17/2018 1550   MONOABS 1.7 (H) 05/10/2012 0552   EOSABS 0.4 09/17/2018 1550   EOSABS 0.4 07/24/2017 1421   EOSABS 0.8 (H) 05/10/2012 0552   BASOSABS 0.0 09/17/2018 1550   BASOSABS 0.1 07/24/2017 1421   BASOSABS 0.1 05/10/2012 0552    No results found for this or any previous visit (from the past 240 hour(s)).  Studies/Results: No results found.  Medications: Scheduled Meds: . folic acid  1 mg Oral Daily  . gabapentin  300 mg Oral BID  . hydroxyurea  500 mg Oral BID  . ketorolac  15 mg Intravenous Q6H  . oxyCODONE  10 mg Oral Q4H while awake  . sodium chloride flush  10-40 mL Intracatheter Q12H  . sodium  chloride flush  10-40 mL Intracatheter Q12H   Continuous Infusions: . sodium chloride 75 mL/hr at 09/19/18 1549   PRN Meds:.acetaminophen, heparin lock flush, sodium chloride flush   Assessment/Plan: Active Problems:   Sickle cell pain crisis ([name])   Chronic pain   Leucocytosis  Sickle cell anemia with pain crisis:  Continue IVF 0.45% saline at 75 ml/hour Continue full dose PCA  IV Toradol 15 mg every 6 hours  Tylenol 1000 mg every 6 hours as needed.  Reevaluate pain scale regularly 2L of oxygen via n/c  Leukocytosis:  WBCs 14.9, patient afebrile, no signs of infection.  Urine culture/urinalysis pending Continue to follow CBC  Sickle cell anemia:  Hemoglobin decreased to 6.2, decreased IV fluid. Suspected to be related to hemodilution.  Repeat CBC in am     Code Status: Full Code Family Communication: N/A Disposition Plan: Not yet ready for discharge  [place], MSN, FNP-C [facility] [address] [place], [place] [address] [phone]   If 5PM-7AM, please contact night-coverage.  09/19/2018, 4:16 PM  LOS: 1 day

## 2018-09-19 NOTE — Progress Notes (Signed)
This RN came into room and found IV pump at rate of 555 ml/hr. RN asked patient who admitted to changing rate and stated it was the first time she did this and it would not happen again. This RN opened chart and noted that rate had been changed four times this shift. Risks of pump manipulation and safety was explained to patient. Pt verbalized understanding. [place], NP notified. Will continue to monitor.

## 2018-09-19 NOTE — Progress Notes (Signed)
CRITICAL VALUE ALERT  Critical Value:  Hgb 6.4  Date & Time Notied:  09/19/2018 1052  Provider Notified: Cammie Sickle, FNP-C  Orders Received/Actions taken: Reviewing chart

## 2018-09-20 LAB — CBC
HCT: 18.7 % — ABNORMAL LOW (ref 36.0–46.0)
Hemoglobin: 6.4 g/dL — CL (ref 12.0–15.0)
MCH: 34.6 pg — ABNORMAL HIGH (ref 26.0–34.0)
MCHC: 34.2 g/dL (ref 30.0–36.0)
MCV: 101.1 fL — ABNORMAL HIGH (ref 80.0–100.0)
Platelets: 407 10*3/uL — ABNORMAL HIGH (ref 150–400)
RBC: 1.85 MIL/uL — ABNORMAL LOW (ref 3.87–5.11)
RDW: 17.5 % — ABNORMAL HIGH (ref 11.5–15.5)
WBC: 10.8 10*3/uL — ABNORMAL HIGH (ref 4.0–10.5)
nRBC: 4.1 % — ABNORMAL HIGH (ref 0.0–0.2)

## 2018-09-20 LAB — HEMOGLOBIN AND HEMATOCRIT, BLOOD
HCT: 22.3 % — ABNORMAL LOW (ref 36.0–46.0)
Hemoglobin: 7.2 g/dL — ABNORMAL LOW (ref 12.0–15.0)

## 2018-09-20 LAB — PREPARE RBC (CROSSMATCH)

## 2018-09-20 MED ORDER — DIPHENHYDRAMINE HCL 50 MG/ML IJ SOLN
25.0000 mg | Freq: Once | INTRAMUSCULAR | Status: AC
  Administered 2018-09-20: 25 mg via INTRAVENOUS
  Filled 2018-09-20: qty 1

## 2018-09-20 MED ORDER — GABAPENTIN 300 MG PO CAPS: 300.0000 mg | ORAL_CAPSULE | Freq: Three times a day (TID) | ORAL | 0 refills | Status: DC

## 2018-09-20 MED ORDER — BUTALBITAL-APAP-CAFFEINE 50-325-40 MG PO TABS
1.0000 | ORAL_TABLET | ORAL | Status: DC | PRN
  Administered 2018-09-20: 11:00:00 1 via ORAL
  Filled 2018-09-20: qty 1

## 2018-09-20 MED ORDER — OXYCODONE HCL 5 MG PO TABS: 10.0000 mg | ORAL_TABLET | ORAL | Status: DC | PRN

## 2018-09-20 MED ORDER — GABAPENTIN 300 MG PO CAPS
300.0000 mg | ORAL_CAPSULE | Freq: Three times a day (TID) | ORAL | Status: DC
  Administered 2018-09-20: 17:00:00 300 mg via ORAL
  Filled 2018-09-20: qty 1

## 2018-09-20 NOTE — Discharge Instructions (Signed)
Gabapentin 300 mg three times per day has been added to your home medication regimen for pain control. Follow up with [facility], you will need to establish care.     Sickle Cell Anemia, Adult Sickle cell anemia is a condition where your red blood cells are shaped like sickles. Red blood cells carry oxygen through the body. Sickle-shaped cells do not live as long as normal red blood cells. They also clump together and block blood from flowing through the blood vessels. This prevents the body from getting enough oxygen. Sickle cell anemia causes organ damage and pain. It also increases the risk of infection. Follow these instructions at home: Medicines  Take over-the-counter and prescription medicines only as told by your doctor.  If you were prescribed an antibiotic medicine, take it as told by your doctor. Do not stop taking the antibiotic even if you start to feel better.  If you develop a fever, do not take medicines to lower the fever right away. Tell your doctor about the fever. Managing pain, stiffness, and swelling  Try these methods to help with pain: ? Use a heating pad. ? Take a warm bath. ? Distract yourself, such as by watching TV. Eating and drinking  Drink enough fluid to keep your pee (urine) clear or pale yellow. Drink more in hot weather and during exercise.  Limit or avoid alcohol.  Eat a healthy diet. Eat plenty of fruits, vegetables, whole grains, and lean protein.  Take vitamins and supplements as told by your doctor. Traveling  When traveling, keep these with you: ? Your medical information. ? The names of your doctors. ? Your medicines.  If you need to take an airplane, talk to your doctor first. Activity  Rest often.  Avoid exercises that make your heart beat much faster, such as jogging. General instructions  Do not use products that have nicotine or tobacco, such as cigarettes and e-cigarettes. If you need help quitting, ask  your doctor.  Consider wearing a medical alert bracelet.  Avoid being in high places (high altitudes), such as mountains.  Avoid very hot or cold temperatures.  Avoid places where the temperature changes a lot.  Keep all follow-up visits as told by your doctor. This is important. Contact a doctor if:  A joint hurts.  Your feet or hands hurt or swell.  You feel tired (fatigued). Get help right away if:  You have symptoms of infection. These include: ? Fever. ? Chills. ? Being very tired. ? Irritability. ? Poor eating. ? Throwing up (vomiting).  You feel dizzy or faint.  You have new stomach pain, especially on the left side.  You have a an erection (priapism) that lasts more than 4 hours.  You have numbness in your arms or legs.  You have a hard time moving your arms or legs.  You have trouble talking.  You have pain that does not go away when you take medicine.  You are short of breath.  You are breathing fast.  You have a long-term cough.  You have pain in your chest.  You have a bad headache.  You have a stiff neck.  Your stomach looks bloated even though you did not eat much.  Your skin is pale.  You suddenly cannot see well. Summary  Sickle cell anemia is a condition where your red blood cells are shaped like sickles.  Follow your doctor's advice on ways to manage pain, food to eat, activities to do, and  steps to take for safe travel.  Get medical help right away if you have any signs of infection, such as a fever. This information is not intended to replace advice given to you by your health care provider. Make sure you discuss any questions you have with your health care provider. Document Released: 02/20/2013 Document Revised: 06/07/2016 Document Reviewed: 06/07/2016 Elsevier Interactive Patient Education  2019 [employer].

## 2018-09-20 NOTE — Telephone Encounter (Signed)
Message sent to provider 

## 2018-09-20 NOTE — Progress Notes (Signed)
Patient discharged to home, discharge instructions reviewed with patient who verbalized understanding. No new RX.

## 2018-09-20 NOTE — Discharge Summary (Signed)
Physician Discharge Summary  YOHANA BARTHA SPQ:[id number] DOB: 1990/11/19 DOA: 09/18/2018  PCP: Patient, No Pcp Per  Admit date: 09/18/2018  Discharge date: 09/20/2018  Discharge Diagnoses:  Active Problems:   Sickle cell pain crisis ([name])   Chronic pain   Leucocytosis   Discharge Condition: Stable  Disposition:  Follow-up Information    Tresa Garter, MD Follow up.   Specialty:  Internal Medicine Why:  follow up at [facility] as scheduled Contact information: [place] [address] [phone]          Pt is discharged home in good condition and is to follow up with [facility] in 1 week to establish care and have labs evaluated.  Leatha D Williamson is instructed to increase activity slowly and balance with rest for the next few days, and use prescribed medication to complete treatment of pain  Diet: Regular Wt Readings from Last 3 Encounters:  09/18/18 83 kg  09/17/18 83 kg  12/23/17 93.1 kg    History of present illness:  [name]  is a 28 y.o. female with a medical history significant for sickle cell disease, type SS, chronic pain syndrome, and anemia of chronic disease presents complaining of generalized pain is consistent with typical sickle cell pain crisis.  Patient transitioned to sickle cell day infusion center from the emergency department this a.m. patient states that pain is primarily to bilateral elbows, left knee radiating to shin and foot.  Patient has been taking Tylenol and ibuprofen at home without sustained relief.  Patient has been off of opiate medications for greater than 8 months due to recent incarceration.  Patient states that her car serviced and ended 7 days ago.  She attributes current pain crisis to readjusting to home environment.  Pain intensity on admission 7-8/10 characterized as constant and throbbing. Patient currently denies headache, persistent cough, shortness of  breath, chest pain, nausea, vomiting, or diarrhea.  She also denies recent travel, sick contacts, or exposure to COVID-19.  Sickle cell day infusion center course: Hemoglobin 7.4, consistent with patient's baseline.  WBCs 12.4.  Patient afebrile and maintaining oxygen saturation at 95% on 2 L. Pain persists despite IV Dilaudid via full dose PCA, IV Toradol, Tylenol by mouth, and IV fluids.  Patient admitted to [place] for sickle cell pain crisis.  Hospital Course:  Sickle cell anemia with pain crisis. Patient was admitted for sickle cell pain crisis and managed appropriately with IVF, IV Dilaudid via PCA and IV Toradol, as well as other adjunct therapies per sickle cell pain management protocols.  Dilaudid PCA discontinued on 09/19/2018, patient transition to oxycodone 10 mg every 4 hours.  Patient did not use oxycodone.  Gabapentin 300 mg 3 times daily was added to medication regimen for further chronic pain management.  Patient will discharge home with gabapentin.  Pain intensity decreased to 4/10 primarily to left knee.  Patient is scheduled to establish care with PCP for management sickle cell maintenance medications.  Patient has a history of anemia of chronic disease: Hemoglobin decreased to 6.4, which is below baseline of 8.0-9.0 g/dL.  Patient was transfused 1 unit of PRBCs.  Patient will follow-up with PCP in 1 week to repeat labs.  Patient alert, oriented, and ambulating without assistance.  Oxygen saturation 98% on room air.  Patient was discharged home today in a hemodynamically stable condition.   Discharge Exam: Vitals:   09/20/18 1320 09/20/18 1354  BP: 119/78 114/85  Pulse: (!) 102 96  Resp: 16   Temp: 99 F (37.2 C) 98.4 F (36.9 C)  SpO2: 95% 96%   Vitals:   09/20/18 0516 09/20/18 1246 09/20/18 1320 09/20/18 1354  BP: 115/80 113/79 119/78 114/85  Pulse: 87 (!) 104 (!) 102 96  Resp: 16  16   Temp: 98.5 F (36.9 C) 99 F (37.2 C) 99 F (37.2 C) 98.4 F (36.9 C)   TempSrc: Oral Oral Oral Oral  SpO2: 94% 95% 95% 96%  Weight:      Height:        General appearance : Awake, alert, not in any distress. Speech Clear. Not toxic looking HEENT: Atraumatic and Normocephalic, pupils equally reactive to light and accomodation Neck: Supple, no JVD. No cervical lymphadenopathy.  Chest: Good air entry bilaterally, no added sounds  CVS: S1 S2 regular, no murmurs.  Abdomen: Bowel sounds present, Non tender and not distended with no gaurding, rigidity or rebound. Extremities: B/L Lower Ext shows no edema, both legs are warm to touch Neurology: Awake alert, and oriented X 3, CN II-XII intact, Non focal Skin: No Rash  Discharge Instructions  Discharge Instructions    Discharge patient   Complete by:  As directed    Discharge disposition:  01-Home or Self Care   Discharge patient date:  09/20/2018     Allergies as of 09/20/2018      Reactions   Carrot [daucus Carota] Hives, Swelling   Allergic to carrots   Latex Rash      Medication List    TAKE these medications   folic acid 1 MG tablet Commonly known as:  FOLVITE Take 1 tablet (1 mg total) by mouth daily for 30 days.   gabapentin 300 MG capsule Commonly known as:  NEURONTIN Take 1 capsule (300 mg total) by mouth 3 (three) times daily.   hydroxyurea 500 MG capsule Commonly known as:  HYDREA Take 1 capsule (500 mg total) by mouth 2 (two) times daily for 30 days. Take 2 tabs (1000 mg ) PO QOD starting on 06/02/2017 alternating with 1 tab (500 mg) PO QOD starting on 06/03/2017. May take with food to minimize GI side effects. What changed:  additional instructions   ibuprofen 800 MG tablet Commonly known as:  ADVIL Take 1 tablet (800 mg total) by mouth every 8 (eight) hours as needed for moderate pain.       The results of significant diagnostics from this hospitalization (including imaging, microbiology, ancillary and laboratory) are listed below for reference.    Significant Diagnostic  Studies: No results found.  Microbiology: No results found for this or any previous visit (from the past 240 hour(s)).   Labs: Basic Metabolic Panel: Recent Labs  Lab 09/17/18 1550 09/18/18 0916 09/19/18 1012  NA 142 136 138  K 3.4* 4.0 3.8  CL 111 107 107  CO2 24 24 25   GLUCOSE 109* 105* 114*  BUN 10 13 10   CREATININE 0.62 0.67 0.65  CALCIUM 8.4* 7.8* 8.2*   Liver Function Tests: Recent Labs  Lab 09/17/18 1550 09/19/18 1012  AST 23 40  ALT 29 59*  ALKPHOS 55 50  BILITOT 1.9* 3.9*  PROT 6.7 6.9  ALBUMIN 3.9 4.0   No results for input(s): LIPASE, AMYLASE in the last 168 hours. No results for input(s): AMMONIA in the last 168 hours. CBC: Recent Labs  Lab 09/17/18 1550 09/18/18 0916 09/19/18 1012 09/20/18 0931  WBC 9.9 12.4* 14.9* 10.8*  NEUTROABS 6.2  --   --   --  HGB 7.7* 7.4* 6.4* 6.4*  HCT 22.4* 22.6* 18.6* 18.7*  MCV 101.4* 101.8* 100.0 101.1*  PLT 450* 432* 409* 407*   Cardiac Enzymes: No results for input(s): CKTOTAL, CKMB, CKMBINDEX, TROPONINI in the last 168 hours. BNP: Invalid input(s): POCBNP CBG: No results for input(s): GLUCAP in the last 168 hours.  Time coordinating discharge: 50 minutes  Signed:  Donia Pounds  APRN, MSN, FNP-C [facility] [place],  [id number] [phone]  [facility] 09/20/2018, 3:55 PM

## 2018-09-21 LAB — BPAM RBC
Blood Product Expiration Date: 202005192359
ISSUE DATE / TIME: 202005071245
Unit Type and Rh: 1700

## 2018-09-21 LAB — TYPE AND SCREEN
ABO/RH(D): B POS
Antibody Screen: NEGATIVE
Donor AG Type: NEGATIVE
Unit division: 0

## 2018-09-22 IMAGING — US US PELVIS COMPLETE
1 series · 13 of 25 positions shown · non-contrast
Comparison: CT of 02/26/2016.

CLINICAL DATA: Excessive vaginal bleeding.

EXAM:
TRANSABDOMINAL AND TRANSVAGINAL ULTRASOUND OF PELVIS
TECHNIQUE: Both transabdominal and transvaginal ultrasound examinations of the
pelvis were performed. Transabdominal technique was performed for
global imaging of the pelvis including uterus, ovaries, adnexal
regions, and pelvic cul-de-sac. It was necessary to proceed with
endovaginal exam following the transabdominal exam to visualize the
uterus, ovaries, and adnexa.

[Series 1: us pelvis complete · 0.24mm/px · 50 acquisitions, 13 frames shown]
[im 1/50]
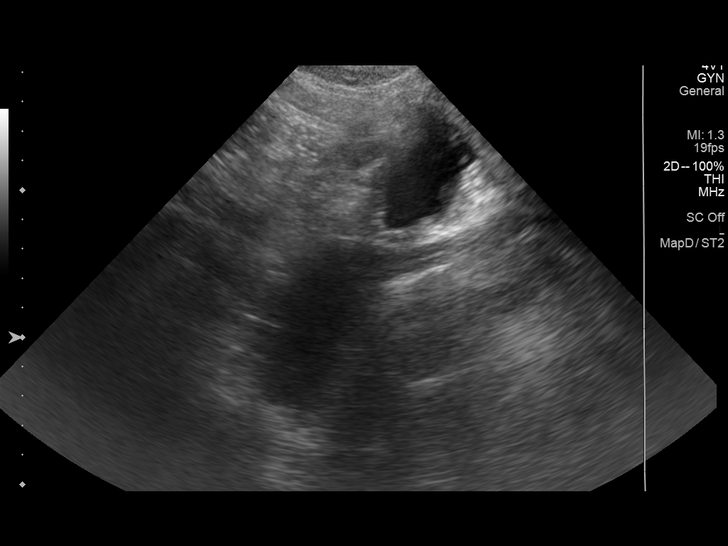
[im 5/50]
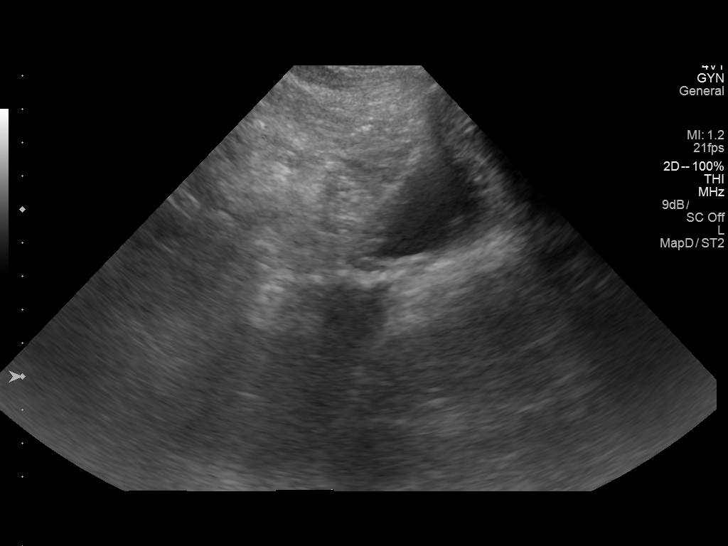
[im 9/50]
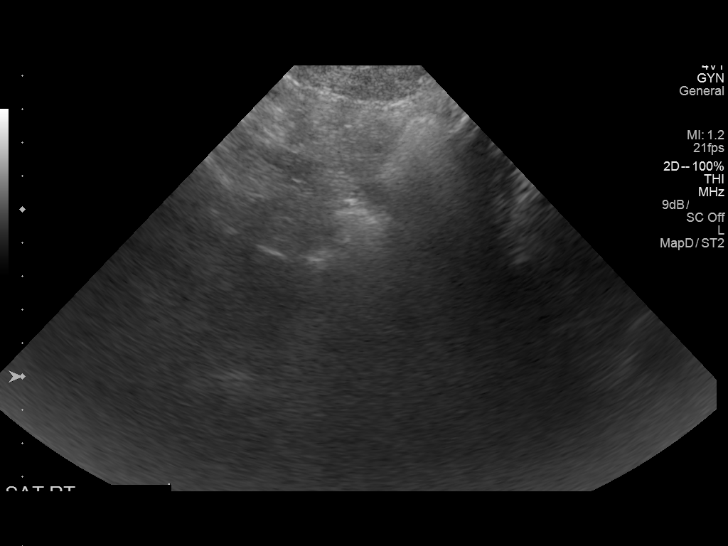
[im 13/50]
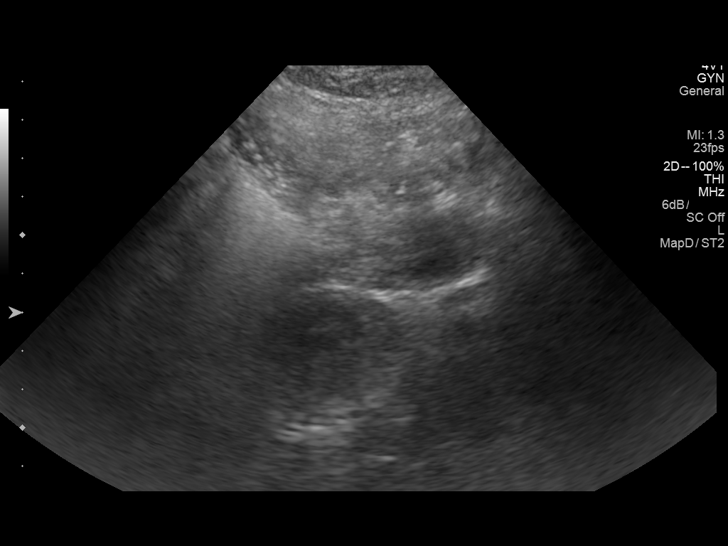
[im 17/50]
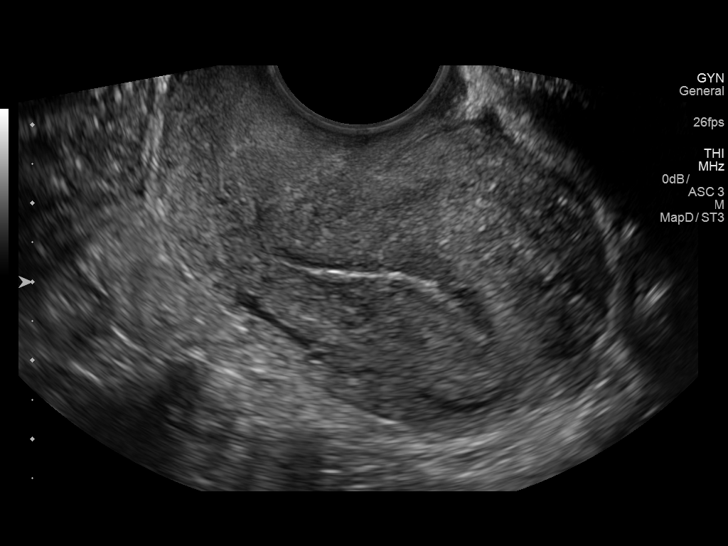
[im 21/50]
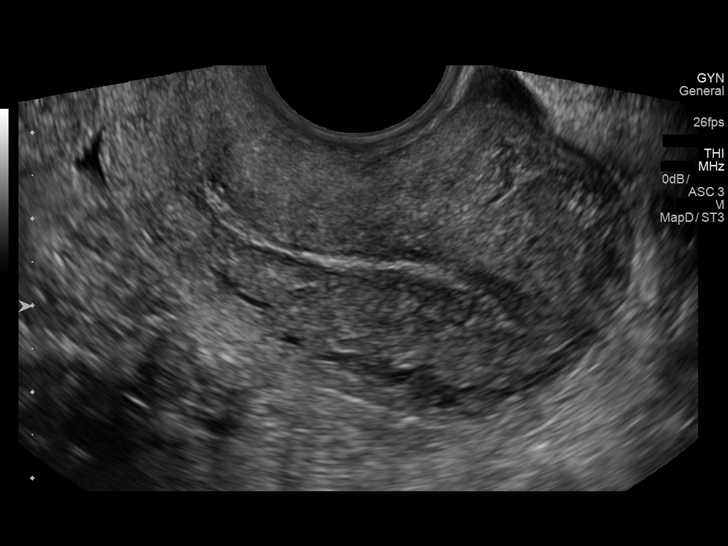
[im 25/50]
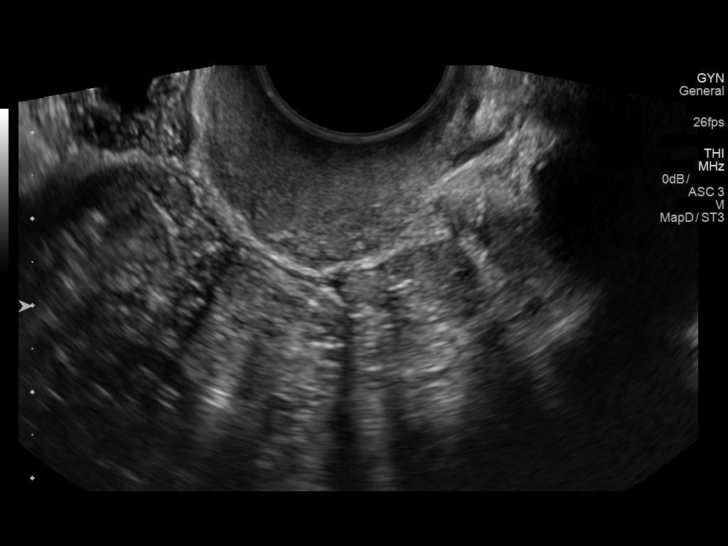
[im 29/50]
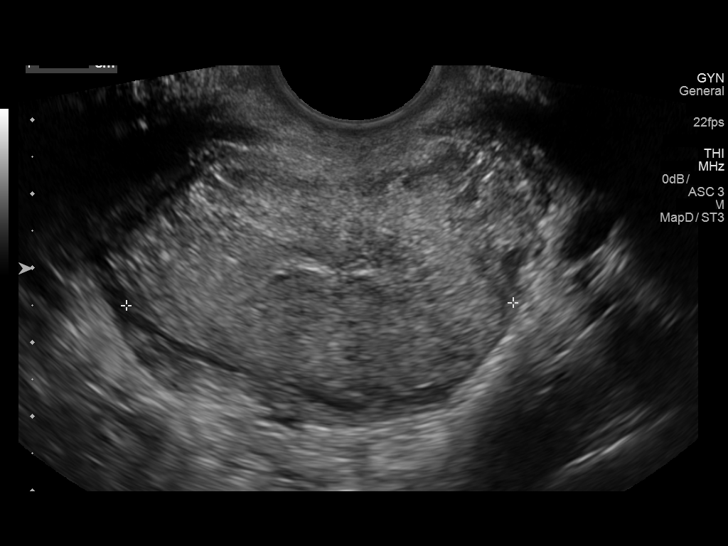
[im 33/50]
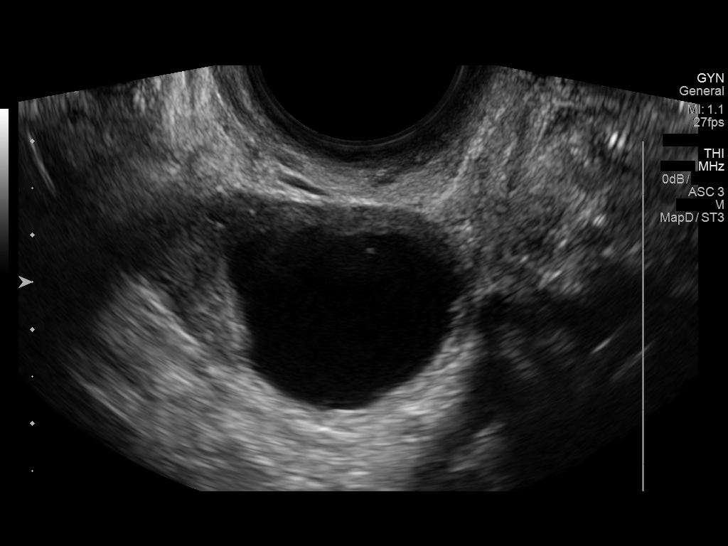
[im 37/50]
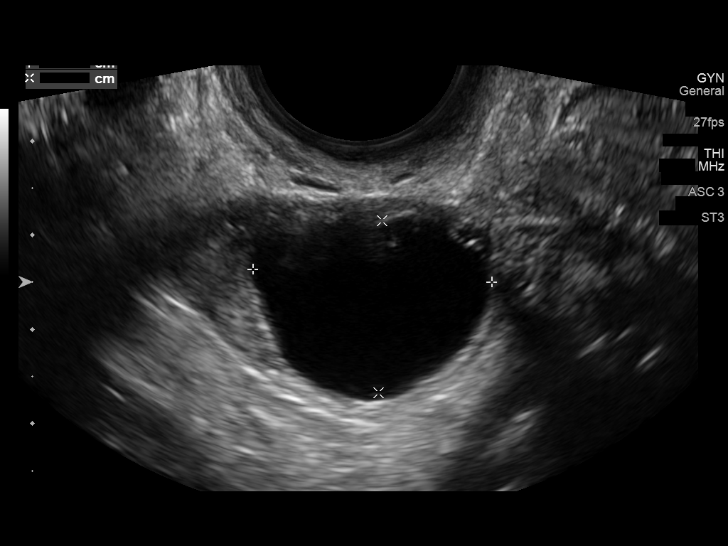
[im 41/50]
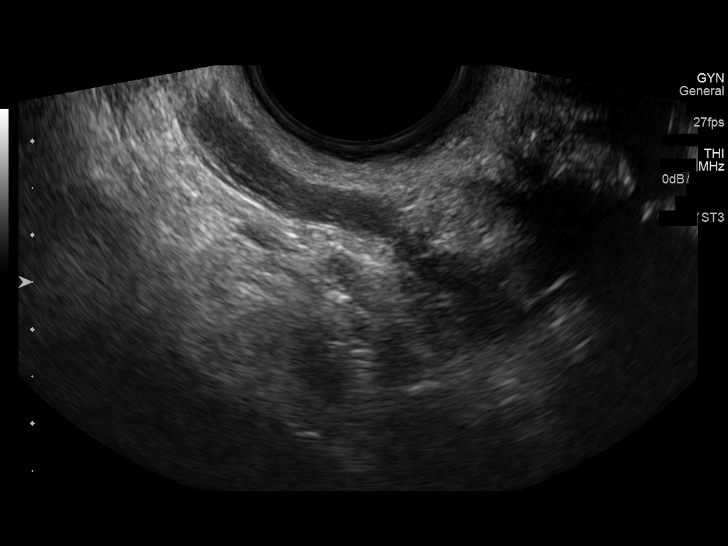
[im 45/50]
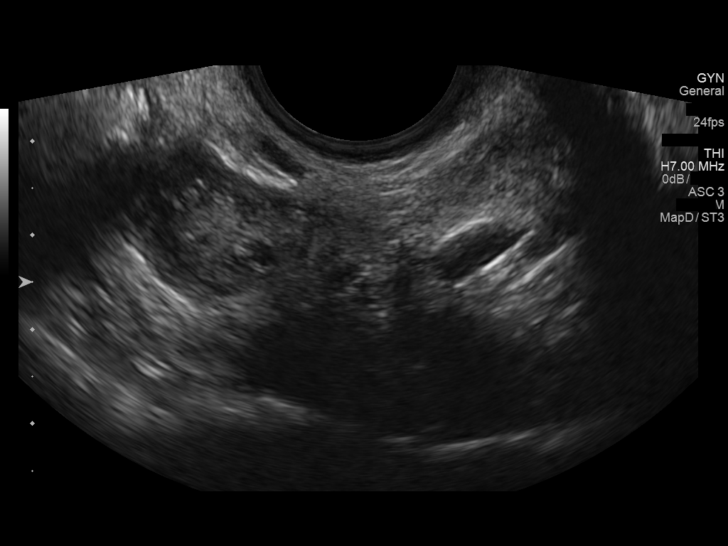
[im 50/50]
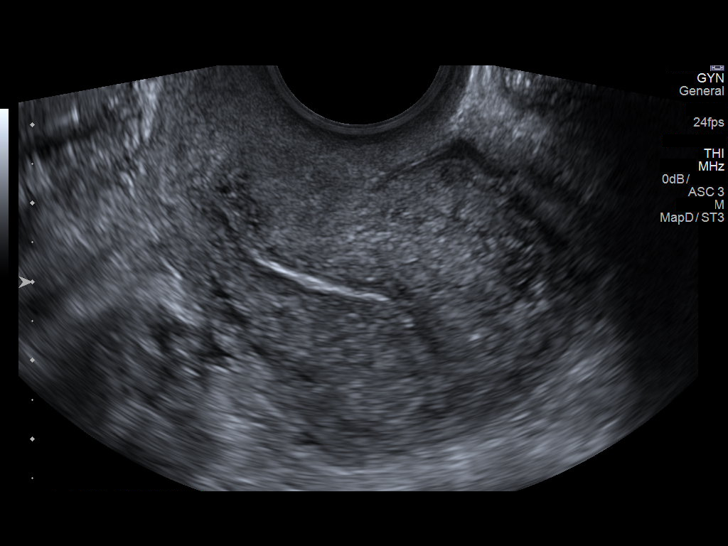

[13 of 25 positions shown; findings below may reference images not displayed]

FINDINGS: Uterus

Measurements: 7.4 x 4.1 x 5.2 cm. No fibroids or other mass
visualized.

Endometrium

Thickness: Normal, 3 mm..  No focal abnormality visualized.

Right ovary

Measurements: 2.0 x 1.4 x 1.5 cm. Normal in morphology.

Left ovary

Measurements: 4.0 x 2.4 x 2.2 cm. An anechoic lesion within measures
2.5 cm and is likely new since the prior CT. An area of curvilinear
complexity within or adjacent to this lesion measures on the order
of 5 mm.
IMPRESSION: 1. No explanation for excessive vaginal bleeding.
2. Left ovarian lesion which is likely a complex dominant follicle
or cyst, especially given absence of lesion in this area on
02/26/2016 CT. Given complexity within, consider follow-up with
ultrasound at 6-12 weeks.

## 2018-10-04 IMAGING — CR DG CHEST 2V
2 series · 2 of 2 positions shown · non-contrast
Comparison: Chest x-rays dated 10/05/2017 and 05/27/2017.

CLINICAL DATA: Elevated WBC - sickle cell - current smoker per
chart

EXAM:
CHEST - 2 VIEW

[w chest lat]
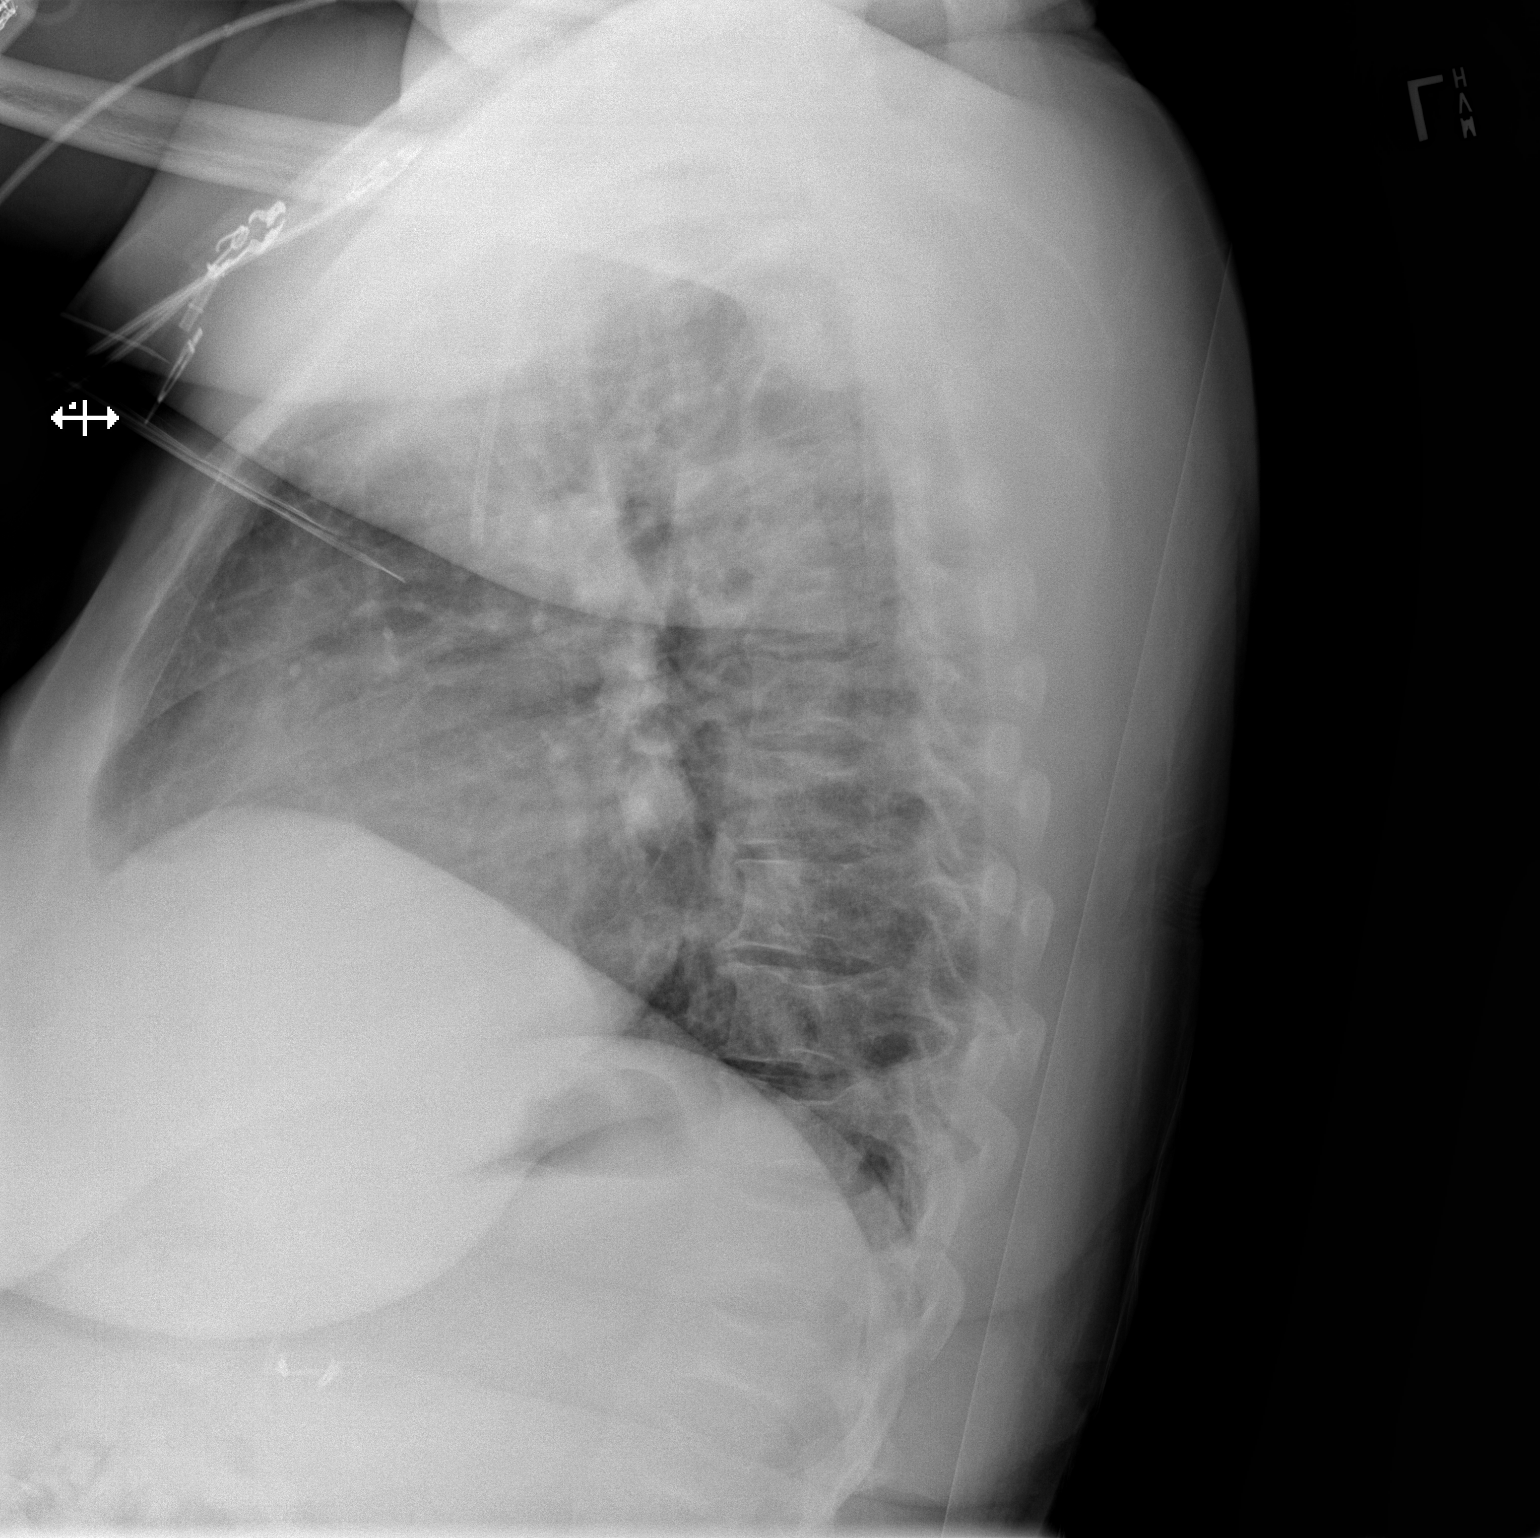

[x chest ap]
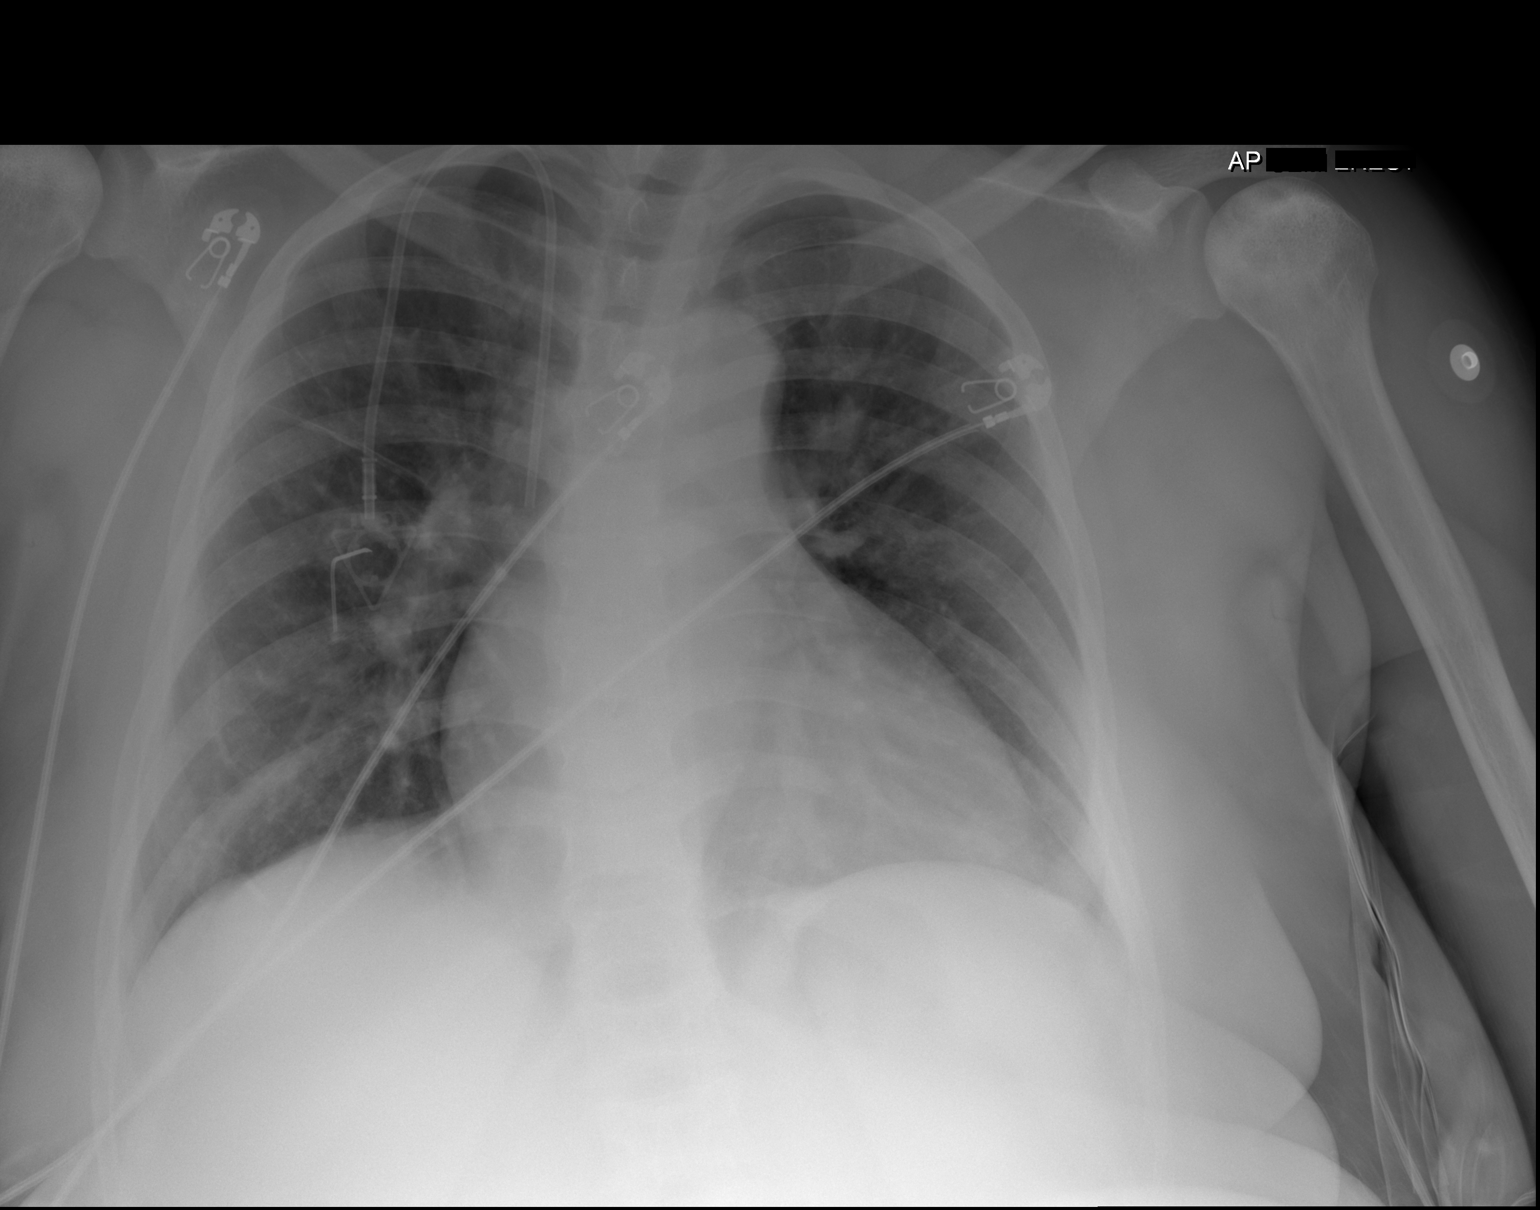

[2 of 2 positions shown; findings below may reference images not displayed]

FINDINGS: Stable cardiomegaly. Linear opacity at the RIGHT lung base, likely
atelectasis. No pleural effusion or pneumothorax seen. RIGHT chest
wall Port-A-Cath appears adequately positioned with tip at the level
of the mid SVC.

No acute or suspicious osseous finding. Sclerotic changes again
noted at the bilateral heads bilaterally, compatible with chronic
AVN. Additional chronic sequela of sickle cell within the
thoracolumbar spine.
IMPRESSION: 1. Probable atelectasis at the RIGHT lung base. Lungs otherwise
clear.
2. Stable cardiomegaly.
3. Bilateral AVN of the humeral heads.

## 2018-10-09 DIAGNOSIS — Z131 Encounter for screening for diabetes mellitus: Secondary | ICD-10-CM | POA: Diagnosis not present

## 2018-10-09 DIAGNOSIS — E559 Vitamin D deficiency, unspecified: Secondary | ICD-10-CM | POA: Diagnosis not present

## 2018-10-09 DIAGNOSIS — R350 Frequency of micturition: Secondary | ICD-10-CM | POA: Diagnosis not present

## 2018-10-09 DIAGNOSIS — R5383 Other fatigue: Secondary | ICD-10-CM | POA: Diagnosis not present

## 2018-10-09 DIAGNOSIS — Z1159 Encounter for screening for other viral diseases: Secondary | ICD-10-CM | POA: Diagnosis not present

## 2018-10-09 DIAGNOSIS — D571 Sickle-cell disease without crisis: Secondary | ICD-10-CM | POA: Diagnosis not present

## 2018-10-09 DIAGNOSIS — Z0000 Encounter for general adult medical examination without abnormal findings: Secondary | ICD-10-CM | POA: Diagnosis not present

## 2018-10-09 DIAGNOSIS — Z114 Encounter for screening for human immunodeficiency virus [HIV]: Secondary | ICD-10-CM | POA: Diagnosis not present

## 2018-10-09 DIAGNOSIS — N915 Oligomenorrhea, unspecified: Secondary | ICD-10-CM | POA: Diagnosis not present

## 2018-10-09 DIAGNOSIS — Z7251 High risk heterosexual behavior: Secondary | ICD-10-CM | POA: Diagnosis not present

## 2018-10-12 ENCOUNTER — Other Ambulatory Visit: Payer: Self-pay | Admitting: Family Medicine

## 2018-10-12 NOTE — Telephone Encounter (Signed)
[name],  Patient has seen Lanelle Bal in the past and is going to be re-establishing care here. Will you speak with Lanelle Bal to determine if this can be filled? It looks like it was last filled by [place] in the hospital. Thanks!

## 2018-10-15 DIAGNOSIS — Z79899 Other long term (current) drug therapy: Secondary | ICD-10-CM | POA: Diagnosis not present

## 2018-10-15 DIAGNOSIS — D571 Sickle-cell disease without crisis: Secondary | ICD-10-CM | POA: Diagnosis not present

## 2018-10-21 ENCOUNTER — Emergency Department (HOSPITAL_COMMUNITY)
Admission: EM | Admit: 2018-10-21 | Discharge: 2018-10-21 | Disposition: A | Discharge status: Home / Self Care | Payer: Medicare Other | Source: Home / Self Care | Attending: Emergency Medicine | Admitting: Emergency Medicine

## 2018-10-21 ENCOUNTER — Other Ambulatory Visit: Payer: Self-pay

## 2018-10-21 ENCOUNTER — Encounter (HOSPITAL_COMMUNITY): Payer: Self-pay

## 2018-10-21 DIAGNOSIS — Z91040 Latex allergy status: Secondary | ICD-10-CM | POA: Diagnosis not present

## 2018-10-21 DIAGNOSIS — D5700 Hb-SS disease with crisis, unspecified: Secondary | ICD-10-CM | POA: Insufficient documentation

## 2018-10-21 DIAGNOSIS — D638 Anemia in other chronic diseases classified elsewhere: Secondary | ICD-10-CM | POA: Diagnosis not present

## 2018-10-21 DIAGNOSIS — M90511 Osteonecrosis in diseases classified elsewhere, right shoulder: Secondary | ICD-10-CM | POA: Diagnosis not present

## 2018-10-21 DIAGNOSIS — Z79899 Other long term (current) drug therapy: Secondary | ICD-10-CM | POA: Insufficient documentation

## 2018-10-21 DIAGNOSIS — Z1159 Encounter for screening for other viral diseases: Secondary | ICD-10-CM | POA: Diagnosis not present

## 2018-10-21 DIAGNOSIS — R079 Chest pain, unspecified: Secondary | ICD-10-CM | POA: Diagnosis not present

## 2018-10-21 DIAGNOSIS — F17200 Nicotine dependence, unspecified, uncomplicated: Secondary | ICD-10-CM | POA: Diagnosis not present

## 2018-10-21 DIAGNOSIS — F17210 Nicotine dependence, cigarettes, uncomplicated: Secondary | ICD-10-CM | POA: Insufficient documentation

## 2018-10-21 DIAGNOSIS — G894 Chronic pain syndrome: Secondary | ICD-10-CM | POA: Diagnosis not present

## 2018-10-21 DIAGNOSIS — M898X1 Other specified disorders of bone, shoulder: Secondary | ICD-10-CM | POA: Diagnosis not present

## 2018-10-21 LAB — COMPREHENSIVE METABOLIC PANEL
ALT: 22 U/L (ref 0–44)
AST: 24 U/L (ref 15–41)
Albumin: 3.8 g/dL (ref 3.5–5.0)
Alkaline Phosphatase: 66 U/L (ref 38–126)
Anion gap: 8 (ref 5–15)
BUN: 13 mg/dL (ref 6–20)
CO2: 24 mmol/L (ref 22–32)
Calcium: 8.4 mg/dL — ABNORMAL LOW (ref 8.9–10.3)
Chloride: 106 mmol/L (ref 98–111)
Creatinine, Ser: 0.69 mg/dL (ref 0.44–1.00)
GFR calc Af Amer: >60 mL/min (ref >60)
GFR calc non Af Amer: >60 mL/min (ref >60)
Glucose, Bld: 85 mg/dL (ref 70–99)
Potassium: 3.9 mmol/L (ref 3.5–5.1)
Sodium: 138 mmol/L (ref 135–145)
Total Bilirubin: 1.3 mg/dL — ABNORMAL HIGH (ref 0.3–1.2)
Total Protein: 6.7 g/dL (ref 6.5–8.1)

## 2018-10-21 LAB — CBC WITH DIFFERENTIAL/PLATELET
Abs Immature Granulocytes: 0.1 10*3/uL — ABNORMAL HIGH (ref 0.00–0.07)
Basophils Absolute: 0.1 10*3/uL (ref 0.0–0.1)
Basophils Relative: 1 %
Eosinophils Absolute: 0.3 10*3/uL (ref 0.0–0.5)
Eosinophils Relative: 2 %
HCT: 24.7 % — ABNORMAL LOW (ref 36.0–46.0)
Hemoglobin: 8.4 g/dL — ABNORMAL LOW (ref 12.0–15.0)
Immature Granulocytes: 1 %
Lymphocytes Relative: 17 %
Lymphs Abs: 2.4 10*3/uL (ref 0.7–4.0)
MCH: 33.6 pg (ref 26.0–34.0)
MCHC: 34 g/dL (ref 30.0–36.0)
MCV: 98.8 fL (ref 80.0–100.0)
Monocytes Absolute: 1.4 10*3/uL — ABNORMAL HIGH (ref 0.1–1.0)
Monocytes Relative: 10 %
Neutro Abs: 10.1 10*3/uL — ABNORMAL HIGH (ref 1.7–7.7)
Neutrophils Relative %: 69 %
Platelets: 409 10*3/uL — ABNORMAL HIGH (ref 150–400)
RBC: 2.5 MIL/uL — ABNORMAL LOW (ref 3.87–5.11)
RDW: 16.1 % — ABNORMAL HIGH (ref 11.5–15.5)
WBC: 14.4 10*3/uL — ABNORMAL HIGH (ref 4.0–10.5)
nRBC: 1.1 % — ABNORMAL HIGH (ref 0.0–0.2)

## 2018-10-21 LAB — RETICULOCYTES
Immature Retic Fract: 39 % — ABNORMAL HIGH (ref 2.3–15.9)
RBC.: 2.5 MIL/uL — ABNORMAL LOW (ref 3.87–5.11)
Retic Count, Absolute: 278.3 10*3/uL — ABNORMAL HIGH (ref 19.0–186.0)
Retic Ct Pct: 11.1 % — ABNORMAL HIGH (ref 0.4–3.1)

## 2018-10-21 LAB — HCG, QUANTITATIVE, PREGNANCY: hCG, Beta Chain, Quant, S: <1 m[IU]/mL (ref <5)

## 2018-10-21 MED ORDER — HYDROMORPHONE HCL 2 MG/ML IJ SOLN: 2.0000 mg | INTRAMUSCULAR | Status: AC

## 2018-10-21 MED ORDER — HYDROMORPHONE HCL 2 MG/ML IJ SOLN
2.0000 mg | INTRAMUSCULAR | Status: AC
  Filled 2018-10-21: qty 1

## 2018-10-21 MED ORDER — HYDROMORPHONE HCL 2 MG/ML IJ SOLN
2.0000 mg | INTRAMUSCULAR | Status: AC
  Administered 2018-10-21: 2 mg via INTRAVENOUS
  Filled 2018-10-21: qty 1

## 2018-10-21 MED ORDER — DIPHENHYDRAMINE HCL 50 MG/ML IJ SOLN
25.0000 mg | Freq: Once | INTRAMUSCULAR | Status: AC
  Administered 2018-10-21: 25 mg via INTRAVENOUS
  Filled 2018-10-21: qty 1

## 2018-10-21 MED ORDER — HEPARIN SOD (PORK) LOCK FLUSH 100 UNIT/ML IV SOLN
500.0000 [IU] | Freq: Once | INTRAVENOUS | Status: AC
  Administered 2018-10-21: 500 [IU]
  Filled 2018-10-21: qty 5

## 2018-10-21 MED ORDER — ONDANSETRON HCL 4 MG/2ML IJ SOLN
4.0000 mg | INTRAMUSCULAR | Status: DC | PRN
  Administered 2018-10-21: 4 mg via INTRAVENOUS
  Filled 2018-10-21: qty 2

## 2018-10-21 MED ORDER — DEXTROSE-NACL 5-0.45 % IV SOLN
INTRAVENOUS | Status: DC
  Administered 2018-10-21: 19:00:00 via INTRAVENOUS

## 2018-10-21 MED ORDER — HYDROMORPHONE HCL 2 MG/ML IJ SOLN
2.0000 mg | INTRAMUSCULAR | Status: AC
  Administered 2018-10-21 (×2): 2 mg via INTRAVENOUS
  Filled 2018-10-21: qty 1

## 2018-10-21 NOTE — ED Provider Notes (Signed)
[facility] DEPT Provider Note   CSN: [id number] Arrival date & time: 10/21/18  1710    History   Chief Complaint Chief Complaint  Patient presents with  . Sickle Cell Pain Crisis    HPI [name] is a 28 y.o. female.     29yo F w/ PMH including sickle cell anemia, GERD, migraines, depression who presents with sickle cell pain crisis.  Patient states that she has been having a pain crisis for the past 2 days including pain in bilateral arms and shoulders.  She states that this is her typical area of her pain crisis.  She has been taking all of her medications as prescribed including scheduled Tylenol, ibuprofen, and gabapentin.  She is not currently on narcotics.  She has been hydrating well and tried to go walking today to help with her pain but when she woke up later, her pain was worse.  She is currently menstruating which she states always precipitates the pain crisis.  She has plans to follow-up at the women's clinic to discuss birth control options.  She denies any fevers, vomiting, cough, urinary symptoms, diarrhea, chest pain, or breathing problems.  The history is provided by the patient.  Sickle Cell Pain Crisis    Past Medical History:  Diagnosis Date  . Blood transfusion    "lots"  . Chronic back pain    "very severe; have knot in my back; from tight muscle; take RX and exercise for it"  . Depression 01/06/2011  . Exertional dyspnea    "sometimes"  . Genital HSV   . GERD (gastroesophageal reflux disease) 02/17/2011  . Migraines 11/08/11   "@ least twice/month"  . 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  . Mood swings 11/08/11   "I go back and forth; real bad"  . Sickle cell anemia ([name])   . Sickle cell anemia with crisis ([place])   . Thrombocytosis ([place]) 11/09/2015  . Trichotillomania    h/o    Patient Active Problem List   Diagnosis Date Noted  . Sickle cell anemia with crisis ([place]) 12/22/2017  . 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 Chest pain 09/29/2017  . Sickle cell crisis ([place]) 09/29/2017  . Leucocytosis   . Hb-SS disease with crisis ([place]) 08/20/2016  . Chronic anticoagulation   . Sickle cell anemia ([place]) 05/19/2016  . History of DVT (deep vein thrombosis) 04/17/2016  . Thrombocytosis ([place]) 11/09/2015  . Hyperbilirubinemia 11/09/2015  . Substance abuse ([place]) 09/26/2015  . Chronic pain 08/04/2015  . Herpes simplex 07/14/2015  . Anemia of chronic disease   . Sickle cell pain crisis ([place]) 03/29/2012  . 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 Hb-SS disease without crisis ([place]) 02-Feb-1992    Past Surgical History:  Procedure Laterality Date  . CHOLECYSTECTOMY  05/2010  . DILATION AND CURETTAGE OF UTERUS  02/20/11   S/P miscarriage  . IR GENERIC HISTORICAL  12/23/2015   IR FLUORO GUIDE CV LINE RIGHT 12/23/2015 Jacqulynn Cadet, MD [facility]-INTERV RAD  . IR GENERIC HISTORICAL  12/23/2015   IR US GUIDE VASC ACCESS RIGHT 12/23/2015 Jacqulynn Cadet, MD [facility]-INTERV RAD     OB History    Gravida  5   Para  2   Term  2   Preterm  0   AB  3   Living  2     SAB  3   TAB  0   Ectopic  0   Multiple  0   Live Births  2        Obstetric Comments  Miscarried in October 2012 at about 7 weeks         Home Medications    Prior to Admission medications   Medication Sig Start Date End Date Taking? Authorizing Provider  acetaminophen (TYLENOL) 650 MG CR tablet Take 650 mg by mouth every 8 (eight) hours as needed for pain.   Yes [provider]  folic acid (FOLVITE) 1 MG tablet Take 1 mg by mouth daily.   Yes [provider]  gabapentin (NEURONTIN) 300 MG capsule Take 1 capsule (300 mg total) by mouth 3 (three) times daily. 09/20/18  Yes Dorena Dew, FNP  hydroxyurea (HYDREA) 500 MG capsule Take 500 mg by mouth 2 (two) times a day.   Yes [provider]  ibuprofen (ADVIL) 800 MG tablet Take 1 tablet (800 mg total) by mouth every 8  (eight) hours as needed for moderate pain. 09/17/18  Yes Long, Wonda Olds, MD    Family History Family History  Problem Relation Age of Onset  . Sickle cell trait Mother   . Sickle cell trait Father   . Diabetes Maternal Grandmother   . Diabetes Paternal Grandmother   . Hypertension Paternal Grandmother   . Diabetes Maternal Grandfather     Social History Social History   Tobacco Use  . Smoking status: Current Some Day Smoker    Packs/day: 0.25    Years: 1.00    Pack years: 0.25    Types: Cigarettes    Last attempt to quit: 03/25/2013    Years since quitting: 5.5  . Smokeless tobacco: Never Used  Substance Use Topics  . Alcohol use: No    Comment: denies  . Drug use: No    Types: Other-see comments    Comment: "Clean for 3 months"     Allergies   Beta-carotene; Carrot [daucus carota]; and Latex   Review of Systems Review of Systems All other systems reviewed and are negative except that which was mentioned in HPI   Physical Exam Updated Vital Signs BP (!) 124/102 (BP Location: Left Arm)   Pulse (!) 104   Temp 98.2 F (36.8 C) (Oral)   Resp (!) 25   Wt 83 kg   LMP 10/21/2018   SpO2 96%   BMI 34.57 kg/m   Physical Exam Vitals signs and nursing note reviewed.  Constitutional:      General: She is not in acute distress.    Appearance: She is well-developed.  HENT:     Head: Normocephalic and atraumatic.  Eyes:     Conjunctiva/sclera: Conjunctivae normal.  Neck:     Musculoskeletal: Neck supple.  Cardiovascular:     Rate and Rhythm: Normal rate and regular rhythm.     Heart sounds: Normal heart sounds. No murmur.  Pulmonary:     Effort: Pulmonary effort is normal.     Breath sounds: Normal breath sounds.  Abdominal:     General: Bowel sounds are normal. There is no distension.     Palpations: Abdomen is soft.     Tenderness: There is no abdominal tenderness.  Musculoskeletal:        General: No swelling or deformity.  Skin:    General: Skin is  warm and dry.     Findings: No rash.     Comments: Port in right upper chest  Neurological:     Mental Status: She is alert and oriented to  person, place, and time.     Comments: Fluent speech  Psychiatric:        Judgment: Judgment normal.      ED Treatments / Results  Labs (all labs ordered are listed, but only abnormal results are displayed) Labs Reviewed  CBC WITH DIFFERENTIAL/PLATELET - Abnormal; Notable for the following components:      Result Value   WBC 14.4 (*)    RBC 2.50 (*)    Hemoglobin 8.4 (*)    HCT 24.7 (*)    RDW 16.1 (*)    Platelets 409 (*)    nRBC 1.1 (*)    Neutro Abs 10.1 (*)    Monocytes Absolute 1.4 (*)    Abs Immature Granulocytes 0.10 (*)    All other components within normal limits  COMPREHENSIVE METABOLIC PANEL - Abnormal; Notable for the following components:   Calcium 8.4 (*)    Total Bilirubin 1.3 (*)    All other components within normal limits  RETICULOCYTES - Abnormal; Notable for the following components:   Retic Ct Pct 11.1 (*)    RBC. 2.50 (*)    Retic Count, Absolute 278.3 (*)    Immature Retic Fract 39.0 (*)    All other components within normal limits  HCG, QUANTITATIVE, PREGNANCY  I-STAT BETA HCG BLOOD, ED ([facility], [facility], [facility] ONLY)    EKG None  Radiology No results found.  Procedures Procedures (including critical care time)  Medications Ordered in ED Medications  dextrose 5 %-0.45 % sodium chloride infusion ( Intravenous Stopped 10/21/18 2251)  HYDROmorphone (DILAUDID) injection 2 mg (0 mg Intravenous Hold 10/21/18 2037)    Or  HYDROmorphone (DILAUDID) injection 2 mg ( Subcutaneous See Alternative 10/21/18 2037)  ondansetron (ZOFRAN) injection 4 mg (4 mg Intravenous Given 10/21/18 2117)  HYDROmorphone (DILAUDID) injection 2 mg (2 mg Intravenous Given 10/21/18 1851)    Or  HYDROmorphone (DILAUDID) injection 2 mg ( Subcutaneous See Alternative 10/21/18 1851)  HYDROmorphone (DILAUDID) injection 2 mg (2 mg Intravenous Given 10/21/18 1943)     Or  HYDROmorphone (DILAUDID) injection 2 mg ( Subcutaneous See Alternative 10/21/18 1943)  HYDROmorphone (DILAUDID) injection 2 mg (2 mg Intravenous Given 10/21/18 2206)    Or  HYDROmorphone (DILAUDID) injection 2 mg ( Subcutaneous See Alternative 10/21/18 2206)  diphenhydrAMINE (BENADRYL) injection 25 mg (25 mg Intravenous Given 10/21/18 1850)  diphenhydrAMINE (BENADRYL) injection 25 mg (25 mg Intravenous Given 10/21/18 2117)  heparin lock flush 100 unit/mL (500 Units Intracatheter Given 10/21/18 2257)     Initial Impression / Assessment and Plan / ED Course  I have reviewed the triage vital signs and the nursing notes.  Pertinent labs that were available during my care of the patient were reviewed by me and considered in my medical decision making (see chart for details).       Afebrile, well-appearing on exam.  No symptoms to suggest acute chest syndrome.  No infectious symptoms.  Initiated pain protocol with Dilaudid and maintenance fluids.  Lab work shows WBC 14.4, hemoglobin 8.4 which is near baseline.  Reassuring CMP and reticulocyte count.  On reassessment, patient was walking around the room and stated that she felt better and wanted to go home.  Supportive measures and return precautions reviewed.  Final Clinical Impressions(s) / ED Diagnoses   Final diagnoses:  Sickle cell crisis [facility])    ED Discharge Orders    None       Mendi Constable, Wenda Overland, MD 10/21/18 2300

## 2018-10-21 NOTE — ED Triage Notes (Addendum)
Pt states that she has pain in bilateral arms and shoulders. 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 Pt states no relief with tylenol arthritis and other OTC meds.

## 2018-10-22 ENCOUNTER — Inpatient Hospital Stay (HOSPITAL_COMMUNITY)
Admission: EM | Admit: 2018-10-22 | Discharge: 2018-10-25 | DRG: 812 | Disposition: A | Discharge status: Home / Self Care | Payer: Medicare Other | Attending: Internal Medicine | Admitting: Internal Medicine

## 2018-10-22 ENCOUNTER — Other Ambulatory Visit: Payer: Self-pay

## 2018-10-22 ENCOUNTER — Encounter (HOSPITAL_COMMUNITY): Payer: Self-pay | Admitting: Emergency Medicine

## 2018-10-22 ENCOUNTER — Emergency Department (HOSPITAL_COMMUNITY): Payer: Medicare Other

## 2018-10-22 DIAGNOSIS — Z833 Family history of diabetes mellitus: Secondary | ICD-10-CM | POA: Diagnosis not present

## 2018-10-22 DIAGNOSIS — Z832 Family history of diseases of the blood and blood-forming organs and certain disorders involving the immune mechanism: Secondary | ICD-10-CM

## 2018-10-22 DIAGNOSIS — Z79899 Other long term (current) drug therapy: Secondary | ICD-10-CM | POA: Diagnosis not present

## 2018-10-22 DIAGNOSIS — Z91018 Allergy to other foods: Secondary | ICD-10-CM | POA: Diagnosis not present

## 2018-10-22 DIAGNOSIS — Z1159 Encounter for screening for other viral diseases: Secondary | ICD-10-CM

## 2018-10-22 DIAGNOSIS — G894 Chronic pain syndrome: Secondary | ICD-10-CM

## 2018-10-22 DIAGNOSIS — M90511 Osteonecrosis in diseases classified elsewhere, right shoulder: Secondary | ICD-10-CM | POA: Diagnosis present

## 2018-10-22 DIAGNOSIS — D5700 Hb-SS disease with crisis, unspecified: Secondary | ICD-10-CM | POA: Diagnosis not present

## 2018-10-22 DIAGNOSIS — F17210 Nicotine dependence, cigarettes, uncomplicated: Secondary | ICD-10-CM | POA: Diagnosis present

## 2018-10-22 DIAGNOSIS — Z9049 Acquired absence of other specified parts of digestive tract: Secondary | ICD-10-CM

## 2018-10-22 DIAGNOSIS — Z8249 Family history of ischemic heart disease and other diseases of the circulatory system: Secondary | ICD-10-CM | POA: Diagnosis not present

## 2018-10-22 DIAGNOSIS — Z86718 Personal history of other venous thrombosis and embolism: Secondary | ICD-10-CM | POA: Diagnosis not present

## 2018-10-22 DIAGNOSIS — F329 Major depressive disorder, single episode, unspecified: Secondary | ICD-10-CM | POA: Diagnosis present

## 2018-10-22 DIAGNOSIS — F17200 Nicotine dependence, unspecified, uncomplicated: Secondary | ICD-10-CM | POA: Diagnosis not present

## 2018-10-22 DIAGNOSIS — K219 Gastro-esophageal reflux disease without esophagitis: Secondary | ICD-10-CM | POA: Diagnosis present

## 2018-10-22 DIAGNOSIS — D638 Anemia in other chronic diseases classified elsewhere: Secondary | ICD-10-CM | POA: Diagnosis not present

## 2018-10-22 DIAGNOSIS — R079 Chest pain, unspecified: Secondary | ICD-10-CM | POA: Diagnosis not present

## 2018-10-22 DIAGNOSIS — Z791 Long term (current) use of non-steroidal anti-inflammatories (NSAID): Secondary | ICD-10-CM | POA: Diagnosis not present

## 2018-10-22 DIAGNOSIS — D72829 Elevated white blood cell count, unspecified: Secondary | ICD-10-CM | POA: Diagnosis not present

## 2018-10-22 DIAGNOSIS — Z91040 Latex allergy status: Secondary | ICD-10-CM

## 2018-10-22 DIAGNOSIS — M898X1 Other specified disorders of bone, shoulder: Secondary | ICD-10-CM | POA: Diagnosis not present

## 2018-10-22 LAB — CBC WITH DIFFERENTIAL/PLATELET
Abs Immature Granulocytes: 0.16 10*3/uL — ABNORMAL HIGH (ref 0.00–0.07)
Basophils Absolute: 0.1 10*3/uL (ref 0.0–0.1)
Basophils Relative: 1 %
Eosinophils Absolute: 0.1 10*3/uL (ref 0.0–0.5)
Eosinophils Relative: 1 %
HCT: 25.3 % — ABNORMAL LOW (ref 36.0–46.0)
Hemoglobin: 8.6 g/dL — ABNORMAL LOW (ref 12.0–15.0)
Immature Granulocytes: 1 %
Lymphocytes Relative: 10 %
Lymphs Abs: 1.8 10*3/uL (ref 0.7–4.0)
MCH: 33.1 pg (ref 26.0–34.0)
MCHC: 34 g/dL (ref 30.0–36.0)
MCV: 97.3 fL (ref 80.0–100.0)
Monocytes Absolute: 1.3 10*3/uL — ABNORMAL HIGH (ref 0.1–1.0)
Monocytes Relative: 7 %
Neutro Abs: 14 10*3/uL — ABNORMAL HIGH (ref 1.7–7.7)
Neutrophils Relative %: 80 %
Platelets: 433 10*3/uL — ABNORMAL HIGH (ref 150–400)
RBC: 2.6 MIL/uL — ABNORMAL LOW (ref 3.87–5.11)
RDW: 16.1 % — ABNORMAL HIGH (ref 11.5–15.5)
WBC: 17.3 10*3/uL — ABNORMAL HIGH (ref 4.0–10.5)
nRBC: 1 % — ABNORMAL HIGH (ref 0.0–0.2)

## 2018-10-22 LAB — RETICULOCYTES
Immature Retic Fract: 40.3 % — ABNORMAL HIGH (ref 2.3–15.9)
RBC.: 2.6 MIL/uL — ABNORMAL LOW (ref 3.87–5.11)
Retic Count, Absolute: 265.2 10*3/uL — ABNORMAL HIGH (ref 19.0–186.0)
Retic Ct Pct: 10.2 % — ABNORMAL HIGH (ref 0.4–3.1)

## 2018-10-22 LAB — COMPREHENSIVE METABOLIC PANEL
ALT: 31 U/L (ref 0–44)
AST: 28 U/L (ref 15–41)
Albumin: 4.2 g/dL (ref 3.5–5.0)
Alkaline Phosphatase: 62 U/L (ref 38–126)
Anion gap: 9 (ref 5–15)
BUN: 10 mg/dL (ref 6–20)
CO2: 24 mmol/L (ref 22–32)
Calcium: 9.1 mg/dL (ref 8.9–10.3)
Chloride: 103 mmol/L (ref 98–111)
Creatinine, Ser: 0.57 mg/dL (ref 0.44–1.00)
GFR calc Af Amer: >60 mL/min (ref >60)
GFR calc non Af Amer: >60 mL/min (ref >60)
Glucose, Bld: 97 mg/dL (ref 70–99)
Potassium: 4 mmol/L (ref 3.5–5.1)
Sodium: 136 mmol/L (ref 135–145)
Total Bilirubin: 1.6 mg/dL — ABNORMAL HIGH (ref 0.3–1.2)
Total Protein: 7.6 g/dL (ref 6.5–8.1)

## 2018-10-22 LAB — PROTIME-INR
INR: 1 (ref 0.8–1.2)
Prothrombin Time: 13.2 seconds (ref 11.4–15.2)

## 2018-10-22 MED ORDER — HYDROMORPHONE HCL 1 MG/ML IJ SOLN
1.0000 mg | INTRAMUSCULAR | Status: AC
  Administered 2018-10-22: 1 mg via INTRAVENOUS
  Filled 2018-10-22: qty 1

## 2018-10-22 MED ORDER — KETOROLAC TROMETHAMINE 30 MG/ML IJ SOLN
30.0000 mg | INTRAMUSCULAR | Status: AC
  Administered 2018-10-22: 30 mg via INTRAVENOUS
  Filled 2018-10-22: qty 1

## 2018-10-22 MED ORDER — HYDROMORPHONE HCL 1 MG/ML IJ SOLN: 1.0000 mg | INTRAMUSCULAR | Status: DC

## 2018-10-22 MED ORDER — DIPHENHYDRAMINE HCL 50 MG/ML IJ SOLN
25.0000 mg | Freq: Once | INTRAMUSCULAR | Status: AC
  Administered 2018-10-22: 25 mg via INTRAVENOUS
  Filled 2018-10-22: qty 1

## 2018-10-22 MED ORDER — HYDROMORPHONE HCL 1 MG/ML IJ SOLN
1.0000 mg | INTRAMUSCULAR | Status: AC
  Administered 2018-10-22 (×2): 1 mg via INTRAVENOUS
  Filled 2018-10-22: qty 1

## 2018-10-22 MED ORDER — HYDROMORPHONE HCL 1 MG/ML IJ SOLN
1.0000 mg | INTRAMUSCULAR | Status: DC
  Filled 2018-10-22: qty 1

## 2018-10-22 MED ORDER — HYDROMORPHONE HCL 1 MG/ML IJ SOLN: 0.5000 mg | INTRAMUSCULAR | Status: DC

## 2018-10-22 MED ORDER — ONDANSETRON HCL 4 MG/2ML IJ SOLN
4.0000 mg | INTRAMUSCULAR | Status: DC | PRN
  Administered 2018-10-22: 4 mg via INTRAVENOUS
  Filled 2018-10-22: qty 2

## 2018-10-22 MED ORDER — NALOXONE HCL 0.4 MG/ML IJ SOLN: 0.4000 mg | INTRAMUSCULAR | Status: DC | PRN

## 2018-10-22 MED ORDER — POLYETHYLENE GLYCOL 3350 17 G PO PACK: 17.0000 g | PACK | Freq: Every day | ORAL | Status: DC | PRN

## 2018-10-22 MED ORDER — HYDROMORPHONE HCL 1 MG/ML IJ SOLN
1.0000 mg | Freq: Once | INTRAMUSCULAR | Status: AC
  Administered 2018-10-22: 1 mg via INTRAVENOUS
  Filled 2018-10-22: qty 1

## 2018-10-22 MED ORDER — HYDROXYUREA 500 MG PO CAPS
500.0000 mg | ORAL_CAPSULE | Freq: Two times a day (BID) | ORAL | Status: DC
  Administered 2018-10-23 – 2018-10-25 (×5): 500 mg via ORAL
  Filled 2018-10-22 (×5): qty 1

## 2018-10-22 MED ORDER — GABAPENTIN 300 MG PO CAPS
300.0000 mg | ORAL_CAPSULE | Freq: Two times a day (BID) | ORAL | Status: DC
  Administered 2018-10-23 – 2018-10-25 (×5): 300 mg via ORAL
  Filled 2018-10-22 (×5): qty 1

## 2018-10-22 MED ORDER — SODIUM CHLORIDE 0.9% FLUSH: 9.0000 mL | INTRAVENOUS | Status: DC | PRN

## 2018-10-22 MED ORDER — FOLIC ACID 1 MG PO TABS
1.0000 mg | ORAL_TABLET | Freq: Every day | ORAL | Status: DC
  Administered 2018-10-23 – 2018-10-25 (×3): 1 mg via ORAL
  Filled 2018-10-22 (×3): qty 1

## 2018-10-22 MED ORDER — HYDROMORPHONE HCL 1 MG/ML IJ SOLN: 1.0000 mg | INTRAMUSCULAR | Status: AC

## 2018-10-22 MED ORDER — HYDROMORPHONE HCL 1 MG/ML IJ SOLN
1.0000 mg | Freq: Once | INTRAMUSCULAR | Status: AC
  Administered 2018-10-22: 1 mg via INTRAVENOUS

## 2018-10-22 MED ORDER — DEXTROSE-NACL 5-0.45 % IV SOLN
INTRAVENOUS | Status: DC
  Administered 2018-10-22: 18:00:00 via INTRAVENOUS

## 2018-10-22 MED ORDER — SENNOSIDES-DOCUSATE SODIUM 8.6-50 MG PO TABS
1.0000 | ORAL_TABLET | Freq: Two times a day (BID) | ORAL | Status: DC
  Administered 2018-10-23 – 2018-10-25 (×6): 1 via ORAL
  Filled 2018-10-22 (×6): qty 1

## 2018-10-22 MED ORDER — KETOROLAC TROMETHAMINE 15 MG/ML IJ SOLN
15.0000 mg | Freq: Four times a day (QID) | INTRAMUSCULAR | Status: AC
  Administered 2018-10-23 (×4): 15 mg via INTRAVENOUS
  Filled 2018-10-22 (×4): qty 1

## 2018-10-22 MED ORDER — ENOXAPARIN SODIUM 40 MG/0.4ML ~~LOC~~ SOLN
40.0000 mg | SUBCUTANEOUS | Status: DC
  Administered 2018-10-23 – 2018-10-25 (×3): 40 mg via SUBCUTANEOUS
  Filled 2018-10-22 (×3): qty 0.4

## 2018-10-22 MED ORDER — ONDANSETRON HCL 4 MG/2ML IJ SOLN
4.0000 mg | Freq: Four times a day (QID) | INTRAMUSCULAR | Status: DC | PRN
  Administered 2018-10-24 (×2): 4 mg via INTRAVENOUS
  Filled 2018-10-22 (×2): qty 2

## 2018-10-22 MED ORDER — DEXTROSE-NACL 5-0.45 % IV SOLN
INTRAVENOUS | Status: DC
  Administered 2018-10-23 (×3): via INTRAVENOUS

## 2018-10-22 MED ORDER — HYDROMORPHONE 1 MG/ML IV SOLN
INTRAVENOUS | Status: DC
  Administered 2018-10-23: 2.7 mg via INTRAVENOUS
  Administered 2018-10-23: 30 mg via INTRAVENOUS
  Administered 2018-10-23: 1.7 mg via INTRAVENOUS
  Administered 2018-10-23: 3 mg via INTRAVENOUS
  Administered 2018-10-23: 6.8 mg via INTRAVENOUS
  Administered 2018-10-23: 2.4 mg via INTRAVENOUS
  Administered 2018-10-23: 3 mg via INTRAVENOUS
  Administered 2018-10-24: 1.3 mg via INTRAVENOUS
  Administered 2018-10-24: 30 mg via INTRAVENOUS
  Administered 2018-10-24: 2.1 mg via INTRAVENOUS
  Administered 2018-10-24: 3 mg via INTRAVENOUS
  Administered 2018-10-24: 3.3 mg via INTRAVENOUS
  Administered 2018-10-24: 0.3 mg via INTRAVENOUS
  Administered 2018-10-25: 1.2 mg via INTRAVENOUS
  Administered 2018-10-25: 4.1 mg via INTRAVENOUS
  Administered 2018-10-25: 2.1 mg via INTRAVENOUS
  Administered 2018-10-25: 1.2 mg via INTRAVENOUS
  Administered 2018-10-25: 3.3 mg via INTRAVENOUS
  Filled 2018-10-22 (×6): qty 30

## 2018-10-22 NOTE — H&P (Signed)
History and Physical    [name] JGO:[id number] DOB: [birth year] DOA: 10/22/2018  PCP: Patient, No Pcp Per  Patient coming from: Home.  Chief Complaint: Right upper extremity pain.  HPI: [name] is a 28 y.o. female with history of sickle cell anemia presents to the ER with 2 days of pain increasing on the right upper extremity mostly on the right shoulder area.  Denies any chest pain productive cough fever chills headache visual symptoms.  ED Course: In the ER chest x-ray and x-ray of the right shoulder was done.  Chest x-ray does not show anything acute.  X-ray of the right shoulder shows features consistent with avascular necrosis.  Patient required multiple doses of Dilaudid for pain relief.  And has been admitted for further management of sickle cell pain crisis.  Labs show WBC count of 17 hemoglobin 8.6 creatinine 0.5.  Review of Systems: As per HPI, rest all negative.   Past Medical History:  Diagnosis Date  . Blood transfusion    "lots"  . Chronic back pain    "very severe; have knot in my back; from tight muscle; take RX and exercise for it"  . Depression 01/06/2011  . Exertional dyspnea    "sometimes"  . Genital HSV   . GERD (gastroesophageal reflux disease) 02/17/2011  . Migraines 11/08/11   "@ least twice/month"  . 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  . Mood swings 11/08/11   "I go back and forth; real bad"  . Sickle cell anemia ([name])   . Sickle cell anemia with crisis ([place])   . Thrombocytosis ([place]) 11/09/2015  . Trichotillomania    h/o    Past Surgical History:  Procedure Laterality Date  . CHOLECYSTECTOMY  05/2010  . DILATION AND CURETTAGE OF UTERUS  02/20/11   S/P miscarriage  . IR GENERIC HISTORICAL  12/23/2015   IR FLUORO GUIDE CV LINE RIGHT 12/23/2015 Jacqulynn Cadet, MD [facility]  . IR GENERIC HISTORICAL  12/23/2015   IR US GUIDE VASC ACCESS RIGHT 12/23/2015 Jacqulynn Cadet, MD [facility]     reports that she has been  smoking cigarettes. She has a 0.25 pack-year smoking history. She has never used smokeless tobacco. She reports that she does not drink alcohol or use drugs.  Allergies  Allergen Reactions  . Beta-Carotene Hives and Swelling    Had hives and swelling in mouth and neck area  . Carrot [Daucus Carota] Hives and Swelling    Allergic to carrots  . Latex Rash and Hives    Family History  Problem Relation Age of Onset  . Sickle cell trait Mother   . Sickle cell trait Father   . Diabetes Maternal Grandmother   . Diabetes Paternal Grandmother   . Hypertension Paternal Grandmother   . Diabetes Maternal Grandfather     Prior to Admission medications   Medication Sig Start Date End Date Taking? Authorizing Provider  acetaminophen (TYLENOL) 650 MG CR tablet Take 650 mg by mouth every 8 (eight) hours as needed for pain.   Yes [provider]  folic acid (FOLVITE) 1 MG tablet Take 1 mg by mouth daily.   Yes [provider]  gabapentin (NEURONTIN) 300 MG capsule Take 1 capsule (300 mg total) by mouth 3 (three) times daily. Patient taking differently: Take 300 mg by mouth 2 (two) times daily.  09/20/18  Yes Dorena Dew, FNP  hydroxyurea (HYDREA) 500 MG capsule Take 500 mg by mouth 2 (two) times  a day.   Yes [provider]  ibuprofen (ADVIL) 800 MG tablet Take 1 tablet (800 mg total) by mouth every 8 (eight) hours as needed for moderate pain. 09/17/18  Yes LongWonda Olds, MD    Physical Exam: Vitals:   10/22/18 2015 10/22/18 2115 10/22/18 2213 10/22/18 2300  BP:   112/77   Pulse: 92 94 96 93  Resp: 14  14   Temp:      TempSrc:      SpO2: 96% 95% 97% 94%  Height:          Constitutional: Moderately built and nourished. Vitals:   10/22/18 2015 10/22/18 2115 10/22/18 2213 10/22/18 2300  BP:   112/77   Pulse: 92 94 96 93  Resp: 14  14   Temp:      TempSrc:      SpO2: 96% 95% 97% 94%  Height:       Eyes: Anicteric no pallor. ENMT: No discharge from the  ears eyes nose or mouth. Neck: No mass felt.  No neck rigidity no JVD appreciated. Respiratory: No rhonchi or crepitations. Cardiovascular: S1-S2 heard. Abdomen: Soft nontender bowel sounds present. Musculoskeletal: No edema.  No joint effusion.  Pulses are palpable in the upper extremities and lower extremities. Skin: No rash. Neurologic: Alert awake oriented to time place and person.  Moves all extremities. Psychiatric: Appears normal.  Normal affect.   Labs on Admission: I have personally reviewed following labs and imaging studies  CBC: Recent Labs  Lab 10/21/18 1800 10/22/18 1759  WBC 14.4* 17.3*  NEUTROABS 10.1* 14.0*  HGB 8.4* 8.6*  HCT 24.7* 25.3*  MCV 98.8 97.3  PLT 409* 160*   Basic Metabolic Panel: Recent Labs  Lab 10/21/18 1800 10/22/18 1759  NA 138 136  K 3.9 4.0  CL 106 103  CO2 24 24  GLUCOSE 85 97  BUN 13 10  CREATININE 0.69 0.57  CALCIUM 8.4* 9.1   GFR: Estimated Creatinine Clearance: 106.5 mL/min (by C-G formula based on SCr of 0.57 mg/dL). Liver Function Tests: Recent Labs  Lab 10/21/18 1800 10/22/18 1759  AST 24 28  ALT 22 31  ALKPHOS 66 62  BILITOT 1.3* 1.6*  PROT 6.7 7.6  ALBUMIN 3.8 4.2   No results for input(s): LIPASE, AMYLASE in the last 168 hours. No results for input(s): AMMONIA in the last 168 hours. Coagulation Profile: Recent Labs  Lab 10/22/18 1759  INR 1.0   Cardiac Enzymes: No results for input(s): CKTOTAL, CKMB, CKMBINDEX, TROPONINI in the last 168 hours. BNP (last 3 results) No results for input(s): PROBNP in the last 8760 hours. HbA1C: No results for input(s): HGBA1C in the last 72 hours. CBG: No results for input(s): GLUCAP in the last 168 hours. Lipid Profile: No results for input(s): CHOL, HDL, LDLCALC, TRIG, CHOLHDL, LDLDIRECT in the last 72 hours. Thyroid Function Tests: No results for input(s): TSH, T4TOTAL, FREET4, T3FREE, THYROIDAB in the last 72 hours. Anemia Panel: Recent Labs    10/21/18 1800  10/22/18 1759  RETICCTPCT 11.1* 10.2*   Urine analysis:    Component Value Date/Time   COLORURINE COLORLESS (A) 11/21/2017 0400   APPEARANCEUR CLEAR 11/21/2017 0400   APPEARANCEUR Clear 05/06/2012 1546   LABSPEC 1.000 (L) 11/21/2017 0400   LABSPEC 1.011 05/06/2012 1546   PHURINE 6.0 11/21/2017 0400   GLUCOSEU NEGATIVE 11/21/2017 0400   GLUCOSEU Negative 05/06/2012 1546   HGBUR NEGATIVE 11/21/2017 0400   HGBUR negative 01/08/2009 1329   BILIRUBINUR NEGATIVE 11/21/2017  0400   BILIRUBINUR negative 10/18/2017 1432   BILIRUBINUR Negative 05/06/2012 1546   KETONESUR NEGATIVE 11/21/2017 0400   PROTEINUR NEGATIVE 11/21/2017 0400   UROBILINOGEN 0.2 10/18/2017 1432   UROBILINOGEN 0.2 07/24/2017 1347   NITRITE NEGATIVE 11/21/2017 0400   LEUKOCYTESUR NEGATIVE 11/21/2017 0400   LEUKOCYTESUR Negative 05/06/2012 1546   Sepsis Labs: @LABRCNTIP (procalcitonin:4,lacticidven:4) )No results found for this or any previous visit (from the past 240 hour(s)).   Radiological Exams on Admission: Dg Shoulder Right  Result Date: 10/22/2018 CLINICAL DATA:  Right arm pain without known injury. History of sickle cell disease. EXAM: RIGHT SHOULDER - 2+ VIEW COMPARISON:  Radiographs of Oct 06, 2016. FINDINGS: There is no evidence of fracture or dislocation. There is no evidence of arthropathy. Stable sclerosis is seen in right humeral head concerning for avascular necrosis. Soft tissues are unremarkable. IMPRESSION: Stable sclerosis seen in right humeral head consistent with avascular necrosis. No acute abnormality is noted. Electronically Signed   By: Marijo Conception M.D.   On: 10/22/2018 19:23   Dg Chest Port 1 View  Result Date: 10/22/2018 CLINICAL DATA:  Pt is c/o sickle cell pain to chest and right arm. Smoker. EXAM: PORTABLE CHEST - 1 VIEW COMPARISON:  11/19/2017 FINDINGS: Stable right IJ power port.  Lungs are clear. Mild cardiomegaly. No effusion.  No pneumothorax. Visualized bones unremarkable.  IMPRESSION: No acute cardiopulmonary disease. Electronically Signed   By: Lucrezia Europe M.D.   On: 10/22/2018 19:22     Assessment/Plan Principal Problem:   Sickle cell pain crisis ([place]) Active Problems:   Anemia of chronic disease    1. Sickle cell pain crisis -patient will be placed on IV fluids Dilaudid PCA IV Toradol and continue hydroxyurea. 2. Anemia follow CBC.  Patient is on hydroxyurea.  Continue folate. 3. Leukocytosis likely reactionary.  COVID-19 test is pending.   DVT prophylaxis: Lovenox. Code Status: Full code. Family Communication: Discussed with patient. Disposition Plan: Home. Consults called: None. Admission status: Inpatient.   Rise Patience MD [facility] Pager [phone].  If 7PM-7AM, please contact night-coverage [email or web address] Password [facility]  10/22/2018, 11:27 PM

## 2018-10-22 NOTE — ED Triage Notes (Signed)
Pt c/o sickle cell pain in right arm.

## 2018-10-22 NOTE — ED Provider Notes (Signed)
[facility] DEPT Provider Note   CSN: [id number] Arrival date & time: 10/22/18  1415    History   Chief Complaint Chief Complaint  Patient presents with  . Sickle Cell Pain Crisis  . Arm Pain    right    HPI [name] is a 28 y.o. female.     HPI Patient reports that she was feeling much better yesterday after she got treatment for sickle cell pain.  She reports that typically her medications at home, nonnarcotic control is effective.  Yesterday she was treated and felt that she was good to go to work this morning.  She reports she does work a lot using her arms and thinks it may be that re-instigated the sickle cell pain.  Yesterday she had pain in both shoulders and arms.  She reports today it only came back in her right shoulder and upper arm.  Reports the pain is quite intense and aching.  No shortness of breath no chest pain no fever.  No lower extremity pain or swelling. Past Medical History:  Diagnosis Date  . Blood transfusion    "lots"  . Chronic back pain    "very severe; have knot in my back; from tight muscle; take RX and exercise for it"  . Depression 01/06/2011  . Exertional dyspnea    "sometimes"  . Genital HSV   . GERD (gastroesophageal reflux disease) 02/17/2011  . Migraines 11/08/11   "@ least twice/month"  . 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  . Mood swings 11/08/11   "I go back and forth; real bad"  . Sickle cell anemia ([name])   . Sickle cell anemia with crisis ([place])   . Thrombocytosis ([place]) 11/09/2015  . Trichotillomania    h/o    Patient Active Problem List   Diagnosis Date Noted  . Sickle cell anemia with crisis ([place]) 12/22/2017  . 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 Chest pain 09/29/2017  . Sickle cell crisis ([place]) 09/29/2017  . Leucocytosis   . 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 Chronic anticoagulation   . Sickle cell anemia ([place]) 05/19/2016  . 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 Thrombocytosis ([place]) 11/09/2015  . Hyperbilirubinemia 11/09/2015  . Substance abuse ([place]) 09/26/2015  . Chronic pain 08/04/2015  . Herpes simplex 07/14/2015  . Anemia of chronic disease   . Sickle cell pain crisis ([place]) 03/29/2012  . 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 CHOLECYSTECTOMY  05/2010  . DILATION AND CURETTAGE OF UTERUS  02/20/11   S/P miscarriage  . IR GENERIC HISTORICAL  12/23/2015   IR FLUORO GUIDE CV LINE RIGHT 12/23/2015 Jacqulynn Cadet, MD [facility]  . IR GENERIC HISTORICAL  12/23/2015   IR US GUIDE VASC ACCESS RIGHT 12/23/2015 Jacqulynn Cadet, MD [facility]     OB History    Gravida  5   Para  2   Term  2   Preterm  0   AB  3   Living  2     SAB  3   TAB  0   Ectopic  0   Multiple  0   Live Births  2        Obstetric Comments  Miscarried in October 2012 at about 7 weeks         Home Medications  Prior to Admission medications   Medication Sig Start Date End Date Taking? Authorizing Provider  acetaminophen (TYLENOL) 650 MG CR tablet Take 650 mg by mouth every 8 (eight) hours as needed for pain.   Yes [provider]  folic acid (FOLVITE) 1 MG tablet Take 1 mg by mouth daily.   Yes [provider]  gabapentin (NEURONTIN) 300 MG capsule Take 1 capsule (300 mg total) by mouth 3 (three) times daily. Patient taking differently: Take 300 mg by mouth 2 (two) times daily.  09/20/18  Yes Dorena Dew, FNP  hydroxyurea (HYDREA) 500 MG capsule Take 500 mg by mouth 2 (two) times a day.   Yes [provider]  ibuprofen (ADVIL) 800 MG tablet Take 1 tablet (800 mg total) by mouth every 8 (eight) hours as needed for moderate pain. 09/17/18  Yes Long, Wonda Olds, MD    Family History Family History  Problem Relation Age of Onset  . Sickle cell trait Mother   .  Sickle cell trait Father   . Diabetes Maternal Grandmother   . Diabetes Paternal Grandmother   . Hypertension Paternal Grandmother   . Diabetes Maternal Grandfather     Social History Social History   Tobacco Use  . Smoking status: Current Some Day Smoker    Packs/day: 0.25    Years: 1.00    Pack years: 0.25    Types: Cigarettes    Last attempt to quit: 03/25/2013    Years since quitting: 5.5  . Smokeless tobacco: Never Used  Substance Use Topics  . Alcohol use: No    Comment: denies  . Drug use: No    Types: Other-see comments    Comment: "Clean for 3 months"     Allergies   Beta-carotene; Carrot [daucus carota]; and Latex   Review of Systems Review of Systems 10 Systems reviewed and are negative for acute change except as noted in the HPI.   Physical Exam Updated Vital Signs BP 119/77   Pulse 92   Temp 99.7 F (37.6 C) (Oral)   Resp 14   Ht 5\' 2"  (1.575 m)   LMP 10/17/2018   SpO2 96%   BMI 33.47 kg/m   Physical Exam Constitutional:      Appearance: Normal appearance.  HENT:     Head: Normocephalic and atraumatic.  Eyes:     Extraocular Movements: Extraocular movements intact.  Cardiovascular:     Rate and Rhythm: Normal rate and regular rhythm.     Pulses: Normal pulses.     Heart sounds: Normal heart sounds.  Pulmonary:     Effort: Pulmonary effort is normal.     Breath sounds: Normal breath sounds.  Abdominal:     General: There is no distension.     Palpations: Abdomen is soft.     Tenderness: There is no abdominal tenderness. There is no guarding.  Musculoskeletal: Normal range of motion.     Comments: Patient endorses pain to thumb range of motion and palpation over the right shoulder and upper arm.  She reports it is generally a deeper aching quality sensation.  Soft tissues are normal.  Pulse normal.  No joint effusions or soft tissue anomalies.  Bilateral lower extremities calves are soft and nontender.  No peripheral edema.  Skin:     General: Skin is warm and dry.  Neurological:     General: No focal deficit present.     Mental Status: She is alert and oriented to person, place, and  time.     Coordination: Coordination normal.  Psychiatric:        Mood and Affect: Mood normal.      ED Treatments / Results  Labs (all labs ordered are listed, but only abnormal results are displayed) Labs Reviewed  CBC WITH DIFFERENTIAL/PLATELET - Abnormal; Notable for the following components:      Result Value   WBC 17.3 (*)    RBC 2.60 (*)    Hemoglobin 8.6 (*)    HCT 25.3 (*)    RDW 16.1 (*)    Platelets 433 (*)    nRBC 1.0 (*)    Neutro Abs 14.0 (*)    Monocytes Absolute 1.3 (*)    Abs Immature Granulocytes 0.16 (*)    All other components within normal limits  RETICULOCYTES - Abnormal; Notable for the following components:   Retic Ct Pct 10.2 (*)    RBC. 2.60 (*)    Retic Count, Absolute 265.2 (*)    Immature Retic Fract 40.3 (*)    All other components within normal limits  COMPREHENSIVE METABOLIC PANEL - Abnormal; Notable for the following components:   Total Bilirubin 1.6 (*)    All other components within normal limits  PROTIME-INR    EKG None  Radiology Dg Shoulder Right  Result Date: 10/22/2018 CLINICAL DATA:  Right arm pain without known injury. History of sickle cell disease. EXAM: RIGHT SHOULDER - 2+ VIEW COMPARISON:  Radiographs of Oct 06, 2016. FINDINGS: There is no evidence of fracture or dislocation. There is no evidence of arthropathy. Stable sclerosis is seen in right humeral head concerning for avascular necrosis. Soft tissues are unremarkable. IMPRESSION: Stable sclerosis seen in right humeral head consistent with avascular necrosis. No acute abnormality is noted. Electronically Signed   By: Marijo Conception M.D.   On: 10/22/2018 19:23   Dg Chest Port 1 View  Result Date: 10/22/2018 CLINICAL DATA:  Pt is c/o sickle cell pain to chest and right arm. Smoker. EXAM: PORTABLE CHEST - 1 VIEW  COMPARISON:  11/19/2017 FINDINGS: Stable right IJ power port.  Lungs are clear. Mild cardiomegaly. No effusion.  No pneumothorax. Visualized bones unremarkable. IMPRESSION: No acute cardiopulmonary disease. Electronically Signed   By: Lucrezia Europe M.D.   On: 10/22/2018 19:22    Procedures Procedures (including critical care time)  Medications Ordered in ED Medications  dextrose 5 %-0.45 % sodium chloride infusion (has no administration in time range)  HYDROmorphone (DILAUDID) injection 0.5 mg (has no administration in time range)    Or  HYDROmorphone (DILAUDID) injection 0.5 mg (has no administration in time range)  HYDROmorphone (DILAUDID) injection 1 mg (has no administration in time range)    Or  HYDROmorphone (DILAUDID) injection 1 mg (has no administration in time range)  HYDROmorphone (DILAUDID) injection 1 mg (has no administration in time range)    Or  HYDROmorphone (DILAUDID) injection 1 mg (has no administration in time range)  ondansetron (ZOFRAN) injection 4 mg (4 mg Intravenous Given 10/22/18 1839)  HYDROmorphone (DILAUDID) injection 1 mg (has no administration in time range)  ketorolac (TORADOL) 30 MG/ML injection 30 mg (30 mg Intravenous Given 10/22/18 1839)  HYDROmorphone (DILAUDID) injection 1 mg (1 mg Intravenous Given 10/22/18 2032)    Or  HYDROmorphone (DILAUDID) injection 1 mg ( Subcutaneous See Alternative 10/22/18 2032)  diphenhydrAMINE (BENADRYL) injection 25 mg (25 mg Intravenous Given 10/22/18 1839)  HYDROmorphone (DILAUDID) injection 1 mg (1 mg Intravenous Given 10/22/18 1946)     Initial Impression /  Assessment and Plan / ED Course  I have reviewed the triage vital signs and the nursing notes.  Pertinent labs & imaging results that were available during my care of the patient were reviewed by me and considered in my medical decision making (see chart for details).  Clinical Course as of Oct 22 2142  Mon Oct 22, 2018  1909 Patient had her first dose of Dilaudid within  the last 30 minutes.  She is not drowsy.  Still has shoulder and arm pain.  Will add 1 mg between doses.   [MP]  2141 Patient continues to report pain.  She does feel this time she will need to be admitted to the hospital.   [MP]    Clinical Course User Index [MP] Charlesetta Shanks, MD       Patient has had recrudescence of pain that was controlled yesterday.  Yesterday it was in both arms.  Today it has returned in the right arm only.  Patient has had pain control regimen.  She continues to have pain and feels that she will have to get admitted today.  No acute findings on x-ray.  Vascular exam is for normal pulses and warm dry hand.  Plan to admit for pain control.  Final Clinical Impressions(s) / ED Diagnoses   Final diagnoses:  Sickle cell pain crisis [facility])    ED Discharge Orders    None       Charlesetta Shanks, MD 10/22/18 2144

## 2018-10-23 DIAGNOSIS — D5700 Hb-SS disease with crisis, unspecified: Principal | ICD-10-CM

## 2018-10-23 DIAGNOSIS — D638 Anemia in other chronic diseases classified elsewhere: Secondary | ICD-10-CM

## 2018-10-23 LAB — CBC WITH DIFFERENTIAL/PLATELET
Abs Immature Granulocytes: 0.15 10*3/uL — ABNORMAL HIGH (ref 0.00–0.07)
Basophils Absolute: 0.1 10*3/uL (ref 0.0–0.1)
Basophils Relative: 0 %
Eosinophils Absolute: 0.4 10*3/uL (ref 0.0–0.5)
Eosinophils Relative: 2 %
HCT: 23.9 % — ABNORMAL LOW (ref 36.0–46.0)
Hemoglobin: 8 g/dL — ABNORMAL LOW (ref 12.0–15.0)
Immature Granulocytes: 1 %
Lymphocytes Relative: 17 %
Lymphs Abs: 2.9 10*3/uL (ref 0.7–4.0)
MCH: 33.2 pg (ref 26.0–34.0)
MCHC: 33.5 g/dL (ref 30.0–36.0)
MCV: 99.2 fL (ref 80.0–100.0)
Monocytes Absolute: 2 10*3/uL — ABNORMAL HIGH (ref 0.1–1.0)
Monocytes Relative: 12 %
Neutro Abs: 11.5 10*3/uL — ABNORMAL HIGH (ref 1.7–7.7)
Neutrophils Relative %: 68 %
Platelets: 371 10*3/uL (ref 150–400)
RBC: 2.41 MIL/uL — ABNORMAL LOW (ref 3.87–5.11)
RDW: 16.4 % — ABNORMAL HIGH (ref 11.5–15.5)
WBC: 17 10*3/uL — ABNORMAL HIGH (ref 4.0–10.5)
nRBC: 1.5 % — ABNORMAL HIGH (ref 0.0–0.2)

## 2018-10-23 LAB — CBC
HCT: 25.6 % — ABNORMAL LOW (ref 36.0–46.0)
Hemoglobin: 8.9 g/dL — ABNORMAL LOW (ref 12.0–15.0)
MCH: 34.2 pg — ABNORMAL HIGH (ref 26.0–34.0)
MCHC: 34.8 g/dL (ref 30.0–36.0)
MCV: 98.5 fL (ref 80.0–100.0)
Platelets: 435 10*3/uL — ABNORMAL HIGH (ref 150–400)
RBC: 2.6 MIL/uL — ABNORMAL LOW (ref 3.87–5.11)
RDW: 16.3 % — ABNORMAL HIGH (ref 11.5–15.5)
WBC: 18.2 10*3/uL — ABNORMAL HIGH (ref 4.0–10.5)
nRBC: 1 % — ABNORMAL HIGH (ref 0.0–0.2)

## 2018-10-23 LAB — CREATININE, SERUM
Creatinine, Ser: 0.67 mg/dL (ref 0.44–1.00)
GFR calc Af Amer: >60 mL/min (ref >60)
GFR calc non Af Amer: >60 mL/min (ref >60)

## 2018-10-23 LAB — HIV ANTIBODY (ROUTINE TESTING W REFLEX): HIV Screen 4th Generation wRfx: NONREACTIVE

## 2018-10-23 LAB — SARS CORONAVIRUS 2 BY RT PCR (HOSPITAL ORDER, PERFORMED IN ~~LOC~~ HOSPITAL LAB): SARS Coronavirus 2: NEGATIVE

## 2018-10-23 LAB — LACTATE DEHYDROGENASE: LDH: 260 U/L — ABNORMAL HIGH (ref 98–192)

## 2018-10-23 MED ORDER — DIPHENHYDRAMINE HCL 12.5 MG/5ML PO ELIX
25.0000 mg | ORAL_SOLUTION | ORAL | Status: DC | PRN
  Administered 2018-10-23: 25 mg via ORAL
  Filled 2018-10-23: qty 10

## 2018-10-23 MED ORDER — SODIUM CHLORIDE 0.9% FLUSH: 10.0000 mL | INTRAVENOUS | Status: DC | PRN

## 2018-10-23 MED ORDER — ACETAMINOPHEN 500 MG PO TABS: 1000.0000 mg | ORAL_TABLET | Freq: Four times a day (QID) | ORAL | Status: DC | PRN

## 2018-10-23 NOTE — Progress Notes (Signed)
CSW met with patient at bedside. Introduced self and role at the Patient [name]. Patient receptive but also drowsy and nodding off during conversation. Advised that CSW available for support and resources at the clinic. Patient has been referred to a counselor by another medical provider. CSW will follow due to patient's social stressors and high risk for readmission.

## 2018-10-23 NOTE — ED Notes (Signed)
ED TO INPATIENT HANDOFF REPORT  ED Nurse Name and Phone #: jon wled   S Name/Age/Gender [name] 28 y.o. female Room/Bed: WA08/WA08  Code Status   Code Status: Full Code  Home {Patient oriented alert x 4  Is this baseline yes   Triage Complete: Triage complete  Chief Complaint sickle cell pain  Triage Note Pt c/o sickle cell pain in right arm.    Allergies Allergies  Allergen Reactions  . Beta-Carotene Hives and Swelling    Had hives and swelling in mouth and neck area  . Carrot [Daucus Carota] Hives and Swelling    Allergic to carrots  . Latex Rash and Hives    Level of Care/Admitting Diagnosis ED Disposition    ED Disposition Condition [facility] Area: [place] [[id number]]  Level of Care: Med-Surg [16]  Covid Evaluation: Screening Protocol (No Symptoms)  Diagnosis: Sickle cell pain crisis [facility]) [[id number]]  Admitting Physician: Rise Patience [phone]  Attending Physician: Rise Patience [phone]  Estimated length of stay: past midnight tomorrow  Certification:: I certify this patient will need inpatient services for at least 2 midnights  PT Class (Do Not Modify): Inpatient [101]  PT Acc Code (Do Not Modify): Private [1]       B Medical/Surgery History Past Medical History:  Diagnosis Date  . Blood transfusion    "lots"  . Chronic back pain    "very severe; have knot in my back; from tight muscle; take RX and exercise for it"  . Depression 01/06/2011  . Exertional dyspnea    "sometimes"  . Genital HSV   . GERD (gastroesophageal reflux disease) 02/17/2011  . Migraines 11/08/11   "@ least twice/month"  . 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  . Mood swings 11/08/11   "I go back and forth; real bad"  . Sickle cell anemia ([name])   . Sickle cell anemia with crisis ([place])   . Thrombocytosis ([place]) 11/09/2015  . Trichotillomania    h/o   Past Surgical History:  Procedure Laterality Date  .  CHOLECYSTECTOMY  05/2010  . DILATION AND CURETTAGE OF UTERUS  02/20/11   S/P miscarriage  . IR GENERIC HISTORICAL  12/23/2015   IR FLUORO GUIDE CV LINE RIGHT 12/23/2015 Jacqulynn Cadet, MD [facility]-INTERV RAD  . IR GENERIC HISTORICAL  12/23/2015   IR US GUIDE VASC ACCESS RIGHT 12/23/2015 Jacqulynn Cadet, MD [facility]-INTERV RAD     A IV Location/Drains/Wounds Patient Lines/Drains/Airways Status   Active Line/Drains/Airways    Name:   Placement date:   Placement time:   Site:   Days:   Implanted Port   -    -    -             Intake/Output Last 24 hours No intake or output data in the 24 hours ending 10/23/18 0009  Labs/Imaging Results for orders placed or performed during the hospital encounter of 10/22/18 (from the past 48 hour(s))  CBC WITH DIFFERENTIAL     Status: Abnormal   Collection Time: 10/22/18  5:59 PM  Result Value Ref Range   WBC 17.3 (H) 4.0 - 10.5 K/uL   RBC 2.60 (L) 3.87 - 5.11 MIL/uL   Hemoglobin 8.6 (L) 12.0 - 15.0 g/dL   HCT 25.3 (L) 36.0 - 46.0 %   MCV 97.3 80.0 - 100.0 fL   MCH 33.1 26.0 - 34.0 pg   MCHC 34.0 30.0 - 36.0 g/dL   RDW  16.1 (H) 11.5 - 15.5 %   Platelets 433 (H) 150 - 400 K/uL   nRBC 1.0 (H) 0.0 - 0.2 %   Neutrophils Relative % 80 %   Neutro Abs 14.0 (H) 1.7 - 7.7 K/uL   Lymphocytes Relative 10 %   Lymphs Abs 1.8 0.7 - 4.0 K/uL   Monocytes Relative 7 %   Monocytes Absolute 1.3 (H) 0.1 - 1.0 K/uL   Eosinophils Relative 1 %   Eosinophils Absolute 0.1 0.0 - 0.5 K/uL   Basophils Relative 1 %   Basophils Absolute 0.1 0.0 - 0.1 K/uL   Immature Granulocytes 1 %   Abs Immature Granulocytes 0.16 (H) 0.00 - 0.07 K/uL    Comment: Performed at [facility], [place] [address], [place], [place] [address]  Protime-INR     Status: None   Collection Time: 10/22/18  5:59 PM  Result Value Ref Range   Prothrombin Time 13.2 11.4 - 15.2 seconds   INR 1.0 0.8 - 1.2    Comment: (NOTE) INR goal varies based on device and disease states. Performed at [facility], [place] [address] [place], [place] [address]   Reticulocytes     Status: Abnormal   Collection Time: 10/22/18  5:59 PM  Result Value Ref Range   Retic Ct Pct 10.2 (H) 0.4 - 3.1 %   RBC. 2.60 (L) 3.87 - 5.11 MIL/uL   Retic Count, Absolute 265.2 (H) 19.0 - 186.0 K/uL   Immature Retic Fract 40.3 (H) 2.3 - 15.9 %    Comment: Performed at [facility], [place] [address], [place], [place] [address]  Comprehensive metabolic panel     Status: Abnormal   Collection Time: 10/22/18  5:59 PM  Result Value Ref Range   Sodium 136 135 - 145 mmol/L   Potassium 4.0 3.5 - 5.1 mmol/L   Chloride 103 98 - 111 mmol/L   CO2 24 22 - 32 mmol/L   Glucose, Bld 97 70 - 99 mg/dL   BUN 10 6 - 20 mg/dL   Creatinine, Ser 0.57 0.44 - 1.00 mg/dL   Calcium 9.1 8.9 - 10.3 mg/dL   Total Protein 7.6 6.5 - 8.1 g/dL   Albumin 4.2 3.5 - 5.0 g/dL   AST 28 15 - 41 U/L   ALT 31 0 - 44 U/L   Alkaline Phosphatase 62 38 - 126 U/L   Total Bilirubin 1.6 (H) 0.3 - 1.2 mg/dL   GFR calc non Af Amer >60 >60 mL/min   GFR calc Af Amer >60 >60 mL/min   Anion gap 9 5 - 15    Comment: Performed at [facility], [place] [address], [place], [place] [address]   *Note: Due to a large number of results and/or encounters for the requested time period, some results have not been displayed. A complete set of results can be found in Results Review.   Dg Shoulder Right  Result Date: 10/22/2018 CLINICAL DATA:  Right arm pain without known injury. History of sickle cell disease. EXAM: RIGHT SHOULDER - 2+ VIEW COMPARISON:  Radiographs of Oct 06, 2016. FINDINGS: There is no evidence of fracture or dislocation. There is no evidence of arthropathy. Stable sclerosis is seen in right humeral head concerning for avascular necrosis. Soft tissues are unremarkable. IMPRESSION: Stable sclerosis seen in right humeral head consistent with avascular necrosis. No acute abnormality is noted. Electronically  Signed   By: Marijo Conception M.D.   On: 10/22/2018  19:23   Dg Chest Port 1 View  Result Date: 10/22/2018 CLINICAL DATA:  Pt is c/o sickle cell pain to chest and right arm. Smoker. EXAM: PORTABLE CHEST - 1 VIEW COMPARISON:  11/19/2017 FINDINGS: Stable right IJ power port.  Lungs are clear. Mild cardiomegaly. No effusion.  No pneumothorax. Visualized bones unremarkable. IMPRESSION: No acute cardiopulmonary disease. Electronically Signed   By: Lucrezia Europe M.D.   On: 10/22/2018 19:22    Pending Labs Unresulted Labs (From admission, onward)    Start     Ordered   10/29/18 0500  Creatinine, serum  (enoxaparin (LOVENOX)    CrCl >/= 30 ml/min)  Weekly,   R    Comments:  while on enoxaparin therapy    10/22/18 2326   10/23/18 0500  HIV antibody (Routine Testing)  Tomorrow morning,   R     10/22/18 2326   10/23/18 0500  Lactate dehydrogenase  Tomorrow morning,   R     10/22/18 2326   10/23/18 0500  CBC with Differential/Platelet  Tomorrow morning,   R     10/22/18 2326   10/22/18 2325  CBC  (enoxaparin (LOVENOX)    CrCl >/= 30 ml/min)  Once,   R    Comments:  Baseline for enoxaparin therapy IF NOT ALREADY DRAWN.  Notify MD if PLT < 100 K.    10/22/18 2326   10/22/18 2325  Creatinine, serum  (enoxaparin (LOVENOX)    CrCl >/= 30 ml/min)  Once,   R    Comments:  Baseline for enoxaparin therapy IF NOT ALREADY DRAWN.    10/22/18 2326   10/22/18 2151  SARS Coronavirus 2 (CEPHEID - Performed in [place] hospital lab), Hosp Order  (Asymptomatic Patients Labs)  Once,   R    Question:  Rule Out  Answer:  Yes   10/22/18 2150          Vitals/Pain Today's Vitals   10/22/18 2115 10/22/18 2213 10/22/18 2300 10/22/18 2337  BP:  112/77  140/83  Pulse: 94 96 93 91  Resp:  14  16  Temp:      TempSrc:      SpO2: 95% 97% 94% 94%  Height:      PainSc:        Isolation Precautions No active isolations  Medications Medications  hydroxyurea (HYDREA) capsule 500 mg (has no administration in time  range)  folic acid (FOLVITE) tablet 1 mg (has no administration in time range)  gabapentin (NEURONTIN) capsule 300 mg (has no administration in time range)  senna-docusate (Senokot-S) tablet 1 tablet (has no administration in time range)  polyethylene glycol (MIRALAX / GLYCOLAX) packet 17 g (has no administration in time range)  enoxaparin (LOVENOX) injection 40 mg (has no administration in time range)  dextrose 5 %-0.45 % sodium chloride infusion (has no administration in time range)  ketorolac (TORADOL) 15 MG/ML injection 15 mg (has no administration in time range)  naloxone (NARCAN) injection 0.4 mg (has no administration in time range)    And  sodium chloride flush (NS) 0.9 % injection 9 mL (has no administration in time range)  ondansetron (ZOFRAN) injection 4 mg (has no administration in time range)  HYDROmorphone (DILAUDID) 1 mg/mL PCA injection (has no administration in time range)  ketorolac (TORADOL) 30 MG/ML injection 30 mg (30 mg Intravenous Given 10/22/18 1839)  HYDROmorphone (DILAUDID) injection 1 mg (1 mg Intravenous Given 10/22/18 2304)    Or  HYDROmorphone (DILAUDID) injection 1 mg ( Subcutaneous  See Alternative 10/22/18 2304)  HYDROmorphone (DILAUDID) injection 1 mg (1 mg Intravenous Given 10/22/18 2032)    Or  HYDROmorphone (DILAUDID) injection 1 mg ( Subcutaneous See Alternative 10/22/18 2032)  diphenhydrAMINE (BENADRYL) injection 25 mg (25 mg Intravenous Given 10/22/18 1839)  HYDROmorphone (DILAUDID) injection 1 mg (1 mg Intravenous Given 10/22/18 1946)  HYDROmorphone (DILAUDID) injection 1 mg (1 mg Intravenous Given 10/22/18 2157)  diphenhydrAMINE (BENADRYL) injection 25 mg (25 mg Intravenous Given 10/22/18 2157)    Mobility walks Low fall risk   Focused Assessment   R Recommendations: See Admitting Provider Note  Report given to:    Additional Notes:  Pain management

## 2018-10-23 NOTE — Progress Notes (Signed)
Subjective: [name], a 28 year old female with a medical history of sickle cell disease, type SS, chronic pain syndrome, and anemia of chronic disease was admitted for sickle cell pain crisis.  Patient complains of pain localized to right shoulder characterized as constant, throbbing, and occasionally sharp.  Current pain intensity is 8/10.  Patient attributes current pain crisis to starting menses and increased stressors.  She is currently afebrile and maintaining oxygen saturation at 97% on RA.  Patient denies dizziness, headache, chest pain, shortness of breath, nausea, vomiting, or diarrhea.  Objective:  Vital signs in last 24 hours:  Vitals:   10/23/18 0301 10/23/18 0359 10/23/18 0401 10/23/18 0623  BP: 118/75 (!) 122/57  139/65  Pulse: 89 92  93  Resp: 14 15 16 13   Temp: 99 F (37.2 C) 98.8 F (37.1 C)  98.9 F (37.2 C)  TempSrc: Oral Oral  Oral  SpO2: 99% 96% 97% 94%  Weight:      Height:        Intake/Output from previous day:   Intake/Output Summary (Last 24 hours) at 10/23/2018 2725 Last data filed at 10/23/2018 0600 Gross per 24 hour  Intake 618.54 ml  Output -  Net 618.54 ml    Physical Exam: General: Alert, awake, oriented x3, in no acute distress.  HEENT: [place]/AT PEERL, EOMI Neck: Trachea midline,  no masses, no thyromegal,y no JVD, no carotid bruit OROPHARYNX:  Moist, No exudate/ erythema/lesions.  Heart: Regular rate and rhythm, without murmurs, rubs, gallops, PMI non-displaced, no heaves or thrills on palpation.  Lungs: Clear to auscultation, no wheezing or rhonchi noted. No increased vocal fremitus resonant to percussion  Abdomen: Soft, nontender, nondistended, positive bowel sounds, no masses no hepatosplenomegaly noted..  Neuro: No focal neurological deficits noted cranial nerves II through XII grossly intact. DTRs 2+ bilaterally upper and lower extremities. Strength 5 out of 5 in bilateral upper and lower extremities. Musculoskeletal: No warm  swelling or erythema around joints, no spinal tenderness noted. Psychiatric: Patient alert and oriented x3, good insight and cognition, good recent to remote recall. Lymph node survey: No cervical axillary or inguinal lymphadenopathy noted.  Lab Results:  Basic Metabolic Panel:    Component Value Date/Time   NA 136 10/22/2018 1759   NA 143 07/24/2017 1416   NA 139 05/06/2012 1645   K 4.0 10/22/2018 1759   K 3.9 05/06/2012 1645   CL 103 10/22/2018 1759   CL 110 (H) 05/06/2012 1645   CO2 24 10/22/2018 1759   CO2 22 05/06/2012 1645   BUN 10 10/22/2018 1759   BUN 3 (L) 07/24/2017 1416   BUN 9 05/06/2012 1645   CREATININE 0.67 10/22/2018 2325   CREATININE 0.43 (L) 07/06/2015 1356   GLUCOSE 97 10/22/2018 1759   GLUCOSE 84 05/06/2012 1645   CALCIUM 9.1 10/22/2018 1759   CALCIUM 8.3 (L) 05/06/2012 1645   CBC:    Component Value Date/Time   WBC 18.2 (H) 10/22/2018 2325   HGB 8.9 (L) 10/22/2018 2325   HGB 9.3 (L) 07/24/2017 1421   HCT 25.6 (L) 10/22/2018 2325   HCT 28.9 (L) 07/24/2017 1421   PLT 435 (H) 10/22/2018 2325   PLT 666 (H) 07/24/2017 1421   MCV 98.5 10/22/2018 2325   MCV 83 07/24/2017 1421   MCV 94 05/11/2012 0721   NEUTROABS 14.0 (H) 10/22/2018 1759   NEUTROABS 5.4 07/24/2017 1421   NEUTROABS 10.9 (H) 05/10/2012 0552   LYMPHSABS 1.8 10/22/2018 1759   LYMPHSABS 1.5 07/24/2017 1421  LYMPHSABS 3.5 05/10/2012 0552   MONOABS 1.3 (H) 10/22/2018 1759   MONOABS 1.7 (H) 05/10/2012 0552   EOSABS 0.1 10/22/2018 1759   EOSABS 0.4 07/24/2017 1421   EOSABS 0.8 (H) 05/10/2012 0552   BASOSABS 0.1 10/22/2018 1759   BASOSABS 0.1 07/24/2017 1421   BASOSABS 0.1 05/10/2012 0552    Recent Results (from the past 240 hour(s))  SARS Coronavirus 2 (CEPHEID - Performed in [place] hospital lab), Hosp Order     Status: None   Collection Time: 10/22/18  9:51 PM  Result Value Ref Range Status   SARS Coronavirus 2 NEGATIVE NEGATIVE Final    Comment: (NOTE) If result is  NEGATIVE SARS-CoV-2 target nucleic acids are NOT DETECTED. The SARS-CoV-2 RNA is generally detectable in upper and lower  respiratory specimens during the acute phase of infection. The lowest  concentration of SARS-CoV-2 viral copies this assay can detect is 250  copies / mL. A negative result does not preclude SARS-CoV-2 infection  and should not be used as the sole basis for treatment or other  patient management decisions.  A negative result may occur with  improper specimen collection / handling, submission of specimen other  than nasopharyngeal swab, presence of viral mutation(s) within the  areas targeted by this assay, and inadequate number of viral copies  (<250 copies / mL). A negative result must be combined with clinical  observations, patient history, and epidemiological information. If result is POSITIVE SARS-CoV-2 target nucleic acids are DETECTED. The SARS-CoV-2 RNA is generally detectable in upper and lower  respiratory specimens dur ing the acute phase of infection.  Positive  results are indicative of active infection with SARS-CoV-2.  Clinical  correlation with patient history and other diagnostic information is  necessary to determine patient infection status.  Positive results do  not rule out bacterial infection or co-infection with other viruses. If result is PRESUMPTIVE POSTIVE SARS-CoV-2 nucleic acids MAY BE PRESENT.   A presumptive positive result was obtained on the submitted specimen  and confirmed on repeat testing.  While 2019 novel coronavirus  (SARS-CoV-2) nucleic acids may be present in the submitted sample  additional confirmatory testing may be necessary for epidemiological  and / or clinical management purposes  to differentiate between  SARS-CoV-2 and other Sarbecovirus currently known to infect humans.  If clinically indicated additional testing with an alternate test  methodology [phone]) is advised. The SARS-CoV-2 RNA is generally  detectable  in upper and lower respiratory sp ecimens during the acute  phase of infection. The expected result is Negative. Fact Sheet for Patients:  StrictlyIdeas.no Fact Sheet for Healthcare Providers: BankingDealers.co.za This test is not yet approved or cleared by the [place] FDA and has been authorized for detection and/or diagnosis of SARS-CoV-2 by FDA under an Emergency Use Authorization (EUA).  This EUA will remain in effect (meaning this test can be used) for the duration of the COVID-19 declaration under Section 564(b)(1) of the Act, 21 U.S.C. section 360bbb-3(b)(1), unless the authorization is terminated or revoked sooner. Performed at [facility], [place] [address] [place], [place] [address]     Studies/Results: Dg Shoulder Right  Result Date: 10/22/2018 CLINICAL DATA:  Right arm pain without known injury. History of sickle cell disease. EXAM: RIGHT SHOULDER - 2+ VIEW COMPARISON:  Radiographs of Oct 06, 2016. FINDINGS: There is no evidence of fracture or dislocation. There is no evidence of arthropathy. Stable sclerosis is seen in right humeral head concerning for avascular necrosis. Soft tissues  are unremarkable. IMPRESSION: Stable sclerosis seen in right humeral head consistent with avascular necrosis. No acute abnormality is noted. Electronically Signed   By: Marijo Conception M.D.   On: 10/22/2018 19:23   Dg Chest Port 1 View  Result Date: 10/22/2018 CLINICAL DATA:  Pt is c/o sickle cell pain to chest and right arm. Smoker. EXAM: PORTABLE CHEST - 1 VIEW COMPARISON:  11/19/2017 FINDINGS: Stable right IJ power port.  Lungs are clear. Mild cardiomegaly. No effusion.  No pneumothorax. Visualized bones unremarkable. IMPRESSION: No acute cardiopulmonary disease. Electronically Signed   By: Lucrezia Europe M.D.   On: 10/22/2018 19:22    Medications: Scheduled Meds: . enoxaparin (LOVENOX) injection  40 mg Subcutaneous Q24H  .  folic acid  1 mg Oral Daily  . gabapentin  300 mg Oral BID  . HYDROmorphone   Intravenous Q4H  . hydroxyurea  500 mg Oral BID  . ketorolac  15 mg Intravenous Q6H  . senna-docusate  1 tablet Oral BID   Continuous Infusions: . dextrose 5 % and 0.45% NaCl 100 mL/hr at 10/23/18 0212   PRN Meds:.naloxone **AND** sodium chloride flush, ondansetron (ZOFRAN) IV, polyethylene glycol, sodium chloride flush  Assessment/Plan: Principal Problem:   Sickle cell pain crisis ([name]) Active Problems:   Anemia of chronic disease  Sickle cell disease with pain crisis: Continue IV fluids Toradol 15 mg IV every 6 hours Tylenol 1000 mg every 8 hours Full dose Dilaudid PCA, patient is mostly opiate nave.  She primarily takes ibuprofen and gabapentin for pain control. Reevaluate pain scale regularly Maintain oxygen saturation above 90%, 2 L supplemental oxygen as needed  Leukocytosis: WBCs 17.0, improved from previous.  Patient afebrile, no signs of inflammation or infection.  Suspected to be reactive.  Continue to monitor closely Repeat CBC in a.m.  Chronic pain syndrome: Continue home medications  Anemia of chronic disease: Hemoglobin 8.0, consistent with baseline.  No clinical indication for blood transfusion at this time. Continue hydroxyurea and folic acid Follow CBC  Code Status: Full Code Family Communication: N/A Disposition Plan: Not yet ready for discharge  [place], MSN, FNP-C [facility] [address] [place], [place] [address] [phone]  If 5PM-7AM, please contact night-coverage.  10/23/2018, 8:32 AM  LOS: 1 day

## 2018-10-24 DIAGNOSIS — D72829 Elevated white blood cell count, unspecified: Secondary | ICD-10-CM

## 2018-10-24 DIAGNOSIS — G894 Chronic pain syndrome: Secondary | ICD-10-CM

## 2018-10-24 LAB — BASIC METABOLIC PANEL
Anion gap: 6 (ref 5–15)
BUN: 10 mg/dL (ref 6–20)
CO2: 28 mmol/L (ref 22–32)
Calcium: 8.5 mg/dL — ABNORMAL LOW (ref 8.9–10.3)
Chloride: 104 mmol/L (ref 98–111)
Creatinine, Ser: 0.55 mg/dL (ref 0.44–1.00)
GFR calc Af Amer: >60 mL/min (ref >60)
GFR calc non Af Amer: >60 mL/min (ref >60)
Glucose, Bld: 102 mg/dL — ABNORMAL HIGH (ref 70–99)
Potassium: 4.3 mmol/L (ref 3.5–5.1)
Sodium: 138 mmol/L (ref 135–145)

## 2018-10-24 LAB — CBC
HCT: 22.9 % — ABNORMAL LOW (ref 36.0–46.0)
Hemoglobin: 7.8 g/dL — ABNORMAL LOW (ref 12.0–15.0)
MCH: 33.3 pg (ref 26.0–34.0)
MCHC: 34.1 g/dL (ref 30.0–36.0)
MCV: 97.9 fL (ref 80.0–100.0)
Platelets: 404 10*3/uL — ABNORMAL HIGH (ref 150–400)
RBC: 2.34 MIL/uL — ABNORMAL LOW (ref 3.87–5.11)
RDW: 17.1 % — ABNORMAL HIGH (ref 11.5–15.5)
WBC: 14.2 10*3/uL — ABNORMAL HIGH (ref 4.0–10.5)
nRBC: 2.2 % — ABNORMAL HIGH (ref 0.0–0.2)

## 2018-10-24 MED ORDER — KETOROLAC TROMETHAMINE 15 MG/ML IJ SOLN
15.0000 mg | Freq: Four times a day (QID) | INTRAMUSCULAR | Status: DC
  Administered 2018-10-24 – 2018-10-25 (×4): 15 mg via INTRAVENOUS
  Filled 2018-10-24 (×5): qty 1

## 2018-10-24 NOTE — Progress Notes (Signed)
RN went to patients room to check pump going off,found that patient is asleep and that PCA pulse oximeter and nasal cannula is already disconnected from the patient.

## 2018-10-24 NOTE — Progress Notes (Signed)
Subjective: [name], a 28 year old female with a medical history significant for sickle cell disease, type SS, chronic pain syndrome, and history of anemia of chronic disease was admitted for sickle cell pain crisis.  Patient states that pain intensity has improved overnight.  Pain is localized to right shoulder.  Patient endorses weakness to right upper extremity.  Current pain intensity is 5-6/10.  Patient characterizes pain as intermittent and aching. Patient also endorses nausea.  Patient is afebrile and maintaining oxygen saturation at 100% on RA.  Patient denies dizziness, headache, shortness of breath, chest pain, vomiting, diarrhea, or constipation.  Objective:  Vital signs in last 24 hours:  Vitals:   10/24/18 0359 10/24/18 0600 10/24/18 0854 10/24/18 1002  BP:  122/70  (!) 97/56  Pulse:  88  91  Resp: 12 16 16 18   Temp:  97.8 F (36.6 C)  97.9 F (36.6 C)  TempSrc:  Oral  Oral  SpO2: 96% 100%  99%  Weight:      Height:        Intake/Output from previous day:   Intake/Output Summary (Last 24 hours) at 10/24/2018 1055 Last data filed at 10/24/2018 0900 Gross per 24 hour  Intake 3406.1 ml  Output 0 ml  Net 3406.1 ml    Physical Exam: General: Alert, awake, oriented x3, in no acute distress.  HEENT: [place]/AT PEERL, EOMI Neck: Trachea midline,  no masses, no thyromegal,y no JVD, no carotid bruit OROPHARYNX:  Moist, No exudate/ erythema/lesions.  Heart: Regular rate and rhythm, without murmurs, rubs, gallops, PMI non-displaced, no heaves or thrills on palpation.  Lungs: Clear to auscultation, no wheezing or rhonchi noted. No increased vocal fremitus resonant to percussion  Abdomen: Soft, nontender, nondistended, positive bowel sounds, no masses no hepatosplenomegaly noted..  Neuro: No focal neurological deficits noted cranial nerves II through XII grossly intact. DTRs 2+ bilaterally upper and lower extremities. Strength 5 out of 5 in bilateral upper and lower  extremities. Musculoskeletal: No warm swelling or erythema around joints, no spinal tenderness noted. Psychiatric: Patient alert and oriented x3, good insight and cognition, good recent to remote recall. Lymph node survey: No cervical axillary or inguinal lymphadenopathy noted.  Lab Results:  Basic Metabolic Panel:    Component Value Date/Time   NA 136 10/22/2018 1759   NA 143 07/24/2017 1416   NA 139 05/06/2012 1645   K 4.0 10/22/2018 1759   K 3.9 05/06/2012 1645   CL 103 10/22/2018 1759   CL 110 (H) 05/06/2012 1645   CO2 24 10/22/2018 1759   CO2 22 05/06/2012 1645   BUN 10 10/22/2018 1759   BUN 3 (L) 07/24/2017 1416   BUN 9 05/06/2012 1645   CREATININE 0.67 10/22/2018 2325   CREATININE 0.43 (L) 07/06/2015 1356   GLUCOSE 97 10/22/2018 1759   GLUCOSE 84 05/06/2012 1645   CALCIUM 9.1 10/22/2018 1759   CALCIUM 8.3 (L) 05/06/2012 1645   CBC:    Component Value Date/Time   WBC 17.0 (H) 10/23/2018 0826   HGB 8.0 (L) 10/23/2018 0826   HGB 9.3 (L) 07/24/2017 1421   HCT 23.9 (L) 10/23/2018 0826   HCT 28.9 (L) 07/24/2017 1421   PLT 371 10/23/2018 0826   PLT 666 (H) 07/24/2017 1421   MCV 99.2 10/23/2018 0826   MCV 83 07/24/2017 1421   MCV 94 05/11/2012 0721   NEUTROABS 11.5 (H) 10/23/2018 0826   NEUTROABS 5.4 07/24/2017 1421   NEUTROABS 10.9 (H) 05/10/2012 0552   LYMPHSABS 2.9 10/23/2018  0826   LYMPHSABS 1.5 07/24/2017 1421   LYMPHSABS 3.5 05/10/2012 0552   MONOABS 2.0 (H) 10/23/2018 0826   MONOABS 1.7 (H) 05/10/2012 0552   EOSABS 0.4 10/23/2018 0826   EOSABS 0.4 07/24/2017 1421   EOSABS 0.8 (H) 05/10/2012 0552   BASOSABS 0.1 10/23/2018 0826   BASOSABS 0.1 07/24/2017 1421   BASOSABS 0.1 05/10/2012 0552    Recent Results (from the past 240 hour(s))  SARS Coronavirus 2 (CEPHEID - Performed in [place] hospital lab), Hosp Order     Status: None   Collection Time: 10/22/18  9:51 PM  Result Value Ref Range Status   SARS Coronavirus 2 NEGATIVE NEGATIVE Final     Comment: (NOTE) If result is NEGATIVE SARS-CoV-2 target nucleic acids are NOT DETECTED. The SARS-CoV-2 RNA is generally detectable in upper and lower  respiratory specimens during the acute phase of infection. The lowest  concentration of SARS-CoV-2 viral copies this assay can detect is 250  copies / mL. A negative result does not preclude SARS-CoV-2 infection  and should not be used as the sole basis for treatment or other  patient management decisions.  A negative result may occur with  improper specimen collection / handling, submission of specimen other  than nasopharyngeal swab, presence of viral mutation(s) within the  areas targeted by this assay, and inadequate number of viral copies  (<250 copies / mL). A negative result must be combined with clinical  observations, patient history, and epidemiological information. If result is POSITIVE SARS-CoV-2 target nucleic acids are DETECTED. The SARS-CoV-2 RNA is generally detectable in upper and lower  respiratory specimens dur ing the acute phase of infection.  Positive  results are indicative of active infection with SARS-CoV-2.  Clinical  correlation with patient history and other diagnostic information is  necessary to determine patient infection status.  Positive results do  not rule out bacterial infection or co-infection with other viruses. If result is PRESUMPTIVE POSTIVE SARS-CoV-2 nucleic acids MAY BE PRESENT.   A presumptive positive result was obtained on the submitted specimen  and confirmed on repeat testing.  While 2019 novel coronavirus  (SARS-CoV-2) nucleic acids may be present in the submitted sample  additional confirmatory testing may be necessary for epidemiological  and / or clinical management purposes  to differentiate between  SARS-CoV-2 and other Sarbecovirus currently known to infect humans.  If clinically indicated additional testing with an alternate test  methodology [phone]) is advised. The SARS-CoV-2  RNA is generally  detectable in upper and lower respiratory sp ecimens during the acute  phase of infection. The expected result is Negative. Fact Sheet for Patients:  StrictlyIdeas.no Fact Sheet for Healthcare Providers: BankingDealers.co.za This test is not yet approved or cleared by the [place] FDA and has been authorized for detection and/or diagnosis of SARS-CoV-2 by FDA under an Emergency Use Authorization (EUA).  This EUA will remain in effect (meaning this test can be used) for the duration of the COVID-19 declaration under Section 564(b)(1) of the Act, 21 U.S.C. section 360bbb-3(b)(1), unless the authorization is terminated or revoked sooner. Performed at [facility], [place] [address] [place], [place] [address]     Studies/Results: Dg Shoulder Right  Result Date: 10/22/2018 CLINICAL DATA:  Right arm pain without known injury. History of sickle cell disease. EXAM: RIGHT SHOULDER - 2+ VIEW COMPARISON:  Radiographs of Oct 06, 2016. FINDINGS: There is no evidence of fracture or dislocation. There is no evidence of arthropathy. Stable sclerosis is seen in  right humeral head concerning for avascular necrosis. Soft tissues are unremarkable. IMPRESSION: Stable sclerosis seen in right humeral head consistent with avascular necrosis. No acute abnormality is noted. Electronically Signed   By: Marijo Conception M.D.   On: 10/22/2018 19:23   Dg Chest Port 1 View  Result Date: 10/22/2018 CLINICAL DATA:  Pt is c/o sickle cell pain to chest and right arm. Smoker. EXAM: PORTABLE CHEST - 1 VIEW COMPARISON:  11/19/2017 FINDINGS: Stable right IJ power port.  Lungs are clear. Mild cardiomegaly. No effusion.  No pneumothorax. Visualized bones unremarkable. IMPRESSION: No acute cardiopulmonary disease. Electronically Signed   By: Lucrezia Europe M.D.   On: 10/22/2018 19:22    Medications: Scheduled Meds: . enoxaparin (LOVENOX) injection   40 mg Subcutaneous Q24H  . folic acid  1 mg Oral Daily  . gabapentin  300 mg Oral BID  . HYDROmorphone   Intravenous Q4H  . hydroxyurea  500 mg Oral BID  . senna-docusate  1 tablet Oral BID   Continuous Infusions: PRN Meds:.acetaminophen, diphenhydrAMINE, naloxone **AND** sodium chloride flush, ondansetron (ZOFRAN) IV, polyethylene glycol, sodium chloride flush  Assessment/Plan: Principal Problem:   Sickle cell pain crisis ([name]) Active Problems:   Anemia of chronic disease   Leukocytosis   Chronic pain syndrome  Sickle cell disease with pain crisis: Continue IV fluids at [facility] Toradol 15 mg every 6 hours Tylenol 1000 mg every 8 hours as needed Continue full dose PCA at 1.25 mg/h. Reevaluate pain scale regularly Maintain oxygen saturation above 90%, 2 L supplemental oxygen as needed. Possible discharge in a.m.  Leukocytosis: WBCs improved.  Patient afebrile and no signs of inflammation or infection.  Continue to monitor closely Repeat CBC in a.m.  Chronic pain syndrome: Continue home medications  Anemia of chronic disease: Hemoglobin stable.  Consistent with baseline.  No indication for blood transfusion at this time. Follow CBC   Code Status: Full Code Family Communication: N/A Disposition Plan: Not yet ready for discharge  [place], MSN, FNP-C Patient [place] [address] [place], [place] [address] [phone]  If 5PM-7AM, please contact night-coverage.  10/24/2018, 10:55 AM  LOS: 2 days

## 2018-10-25 MED ORDER — ONDANSETRON HCL 4 MG PO TABS: 4.0000 mg | ORAL_TABLET | Freq: Every day | ORAL | 1 refills | Status: AC | PRN

## 2018-10-25 MED ORDER — HEPARIN SOD (PORK) LOCK FLUSH 100 UNIT/ML IV SOLN
500.0000 [IU] | INTRAVENOUS | Status: AC | PRN
  Administered 2018-10-25: 500 [IU]

## 2018-10-25 NOTE — Care Management Important Message (Signed)
Important Message  Patient Details IM Letter given to Servando Snare SW to present to the Patient Name: [name] MRN: [id number] Date of Birth: 25-May-1990   Medicare Important Message Given:  Yes    Kerin Salen 10/25/2018, 11:27 AM

## 2018-10-25 NOTE — Discharge Instructions (Signed)
Sickle Cell Anemia, Adult °Sickle cell anemia is a condition where your red blood cells are shaped like sickles. Red blood cells carry oxygen through the body. Sickle-shaped cells do not live as long as normal red blood cells. They also clump together and block blood from flowing through the blood vessels. This prevents the body from getting enough oxygen. Sickle cell anemia causes organ damage and pain. It also increases the risk of infection. °Follow these instructions at home: °Medicines °· Take over-the-counter and prescription medicines only as told by your doctor. °· If you were prescribed an antibiotic medicine, take it as told by your doctor. Do not stop taking the antibiotic even if you start to feel better. °· If you develop a fever, do not take medicines to lower the fever right away. Tell your doctor about the fever. °Managing pain, stiffness, and swelling °· Try these methods to help with pain: °? Use a heating pad. °? Take a warm bath. °? Distract yourself, such as by watching TV. °Eating and drinking °· Drink enough fluid to keep your pee (urine) clear or pale yellow. Drink more in hot weather and during exercise. °· Limit or avoid alcohol. °· Eat a healthy diet. Eat plenty of fruits, vegetables, whole grains, and lean protein. °· Take vitamins and supplements as told by your doctor. °Traveling °· When traveling, keep these with you: °? Your medical information. °? The names of your doctors. °? Your medicines. °· If you need to take an airplane, talk to your doctor first. °Activity °· Rest often. °· Avoid exercises that make your heart beat much faster, such as jogging. °General instructions °· Do not use products that have nicotine or tobacco, such as cigarettes and e-cigarettes. If you need help quitting, ask your doctor. °· Consider wearing a medical alert bracelet. °· Avoid being in high places (high altitudes), such as mountains. °· Avoid very hot or cold temperatures. °· Avoid places where the  temperature changes a lot. °· Keep all follow-up visits as told by your doctor. This is important. °Contact a doctor if: °· A joint hurts. °· Your feet or hands hurt or swell. °· You feel tired (fatigued). °Get help right away if: °· You have symptoms of infection. These include: °? Fever. °? Chills. °? Being very tired. °? Irritability. °? Poor eating. °? Throwing up (vomiting). °· You feel dizzy or faint. °· You have new stomach pain, especially on the left side. °· You have a an erection (priapism) that lasts more than 4 hours. °· You have numbness in your arms or legs. °· You have a hard time moving your arms or legs. °· You have trouble talking. °· You have pain that does not go away when you take medicine. °· You are short of breath. °· You are breathing fast. °· You have a long-term cough. °· You have pain in your chest. °· You have a bad headache. °· You have a stiff neck. °· Your stomach looks bloated even though you did not eat much. °· Your skin is pale. °· You suddenly cannot see well. °Summary °· Sickle cell anemia is a condition where your red blood cells are shaped like sickles. °· Follow your doctor's advice on ways to manage pain, food to eat, activities to do, and steps to take for safe travel. °· Get medical help right away if you have any signs of infection, such as a fever. °This information is not intended to replace advice given to you by your   health care provider. Make sure you discuss any questions you have with your health care provider. °Document Released: 02/20/2013 Document Revised: 06/07/2016 Document Reviewed: 06/07/2016 °Elsevier Interactive Patient Education © 2019 [employer] Inc. ° °

## 2018-10-25 NOTE — Discharge Summary (Signed)
Physician Discharge Summary  [name] LZJ:[id number] DOB: [birth year] DOA: 10/22/2018  PCP: Patient, No Pcp Per  Admit date: 10/22/2018  Discharge date: 10/25/2018  Discharge Diagnoses:  Principal Problem:   Sickle cell pain crisis ([place]) Active Problems:   Anemia of chronic disease   Leukocytosis   Chronic pain syndrome   Discharge Condition: Stable  Disposition:  Pt is discharged home in good condition and is to follow up with Patient, No Pcp Per this week to have labs evaluated. [name] is instructed to increase activity slowly and balance with rest for the next few days, and use prescribed medication to complete treatment of pain  Diet: Regular Wt Readings from Last 3 Encounters:  10/23/18 95 kg  10/21/18 83 kg  09/18/18 83 kg    History of present illness:  [name], a 28 year old female with a medical history significant for sickle cell disease, type SS, chronic pain syndrome, history of anemia of chronic disease presented to ER with 2 days of pain increasing in the right upper extremity.  Pain was mostly to shoulder area.  Pain unrelieved by ibuprofen and gabapentin.  Patient is mostly opiate nave.  Pain intensity on admission 7-8/10.  Patient denied chest pain, productive cough, fever, chills, headache, or visual symptoms.  She denied sick contacts, recent travel, or exposure to COVID-19.  ER course: In ER, chest x-ray showed no acute cardiopulmonary process.  X-ray of the right shoulder shows features consistent with avascular necrosis.  Patient wanted multiple doses of IV Dilaudid for pain relief.  Labs showed WBC count 17, hemoglobin 8.6, and creatinine 0.5.  Patient admitted to [place] for further management of sickle cell pain crisis.  Hospital Course:  Sickle cell disease with pain crisis: Patient was admitted for sickle cell pain crisis and managed appropriately with IV fluid, IV Dilaudid via PCA with decreased settings, and IV Toradol.  Patient  was also treated with other adjunct therapies per sickle cell pain management protocol. Patient mostly opiate naive. Patient was not transitioned to oral opiates, she refused.  Pain intensity prior to discharge was 4/10.  Hemoglobin prior to discharge 7.8, consistent with patient's baseline.  No clinical indication for blood transfusion throughout admission.  WBCs trending towards baseline.  Patient will follow-up in clinic on 10/30/2018 and repeat CBC and BMP at that time.  Patient is alert, oriented, and ambulating.  She is afebrile and maintaining oxygen saturation at 100% on room air.  Patient will be discharged home in a hemodynamically stable condition.   Discharge Exam: Vitals:   10/25/18 0549 10/25/18 0805  BP: 121/85   Pulse: 96   Resp: 18 13  Temp: 98.7 F (37.1 C)   SpO2: 94%    Vitals:   10/25/18 0042 10/25/18 0526 10/25/18 0549 10/25/18 0805  BP: 129/65  121/85   Pulse: 93  96   Resp:  11 18 13   Temp: 98.1 F (36.7 C)  98.7 F (37.1 C)   TempSrc: Oral  Oral   SpO2: 96% 94% 94%   Weight:      Height:       Physical Exam Constitutional:      Appearance: Normal appearance.  HENT:     Head: Normocephalic.     Mouth/Throat:     Mouth: Mucous membranes are moist.  Neck:     Musculoskeletal: Normal range of motion and neck supple.  Cardiovascular:     Rate and Rhythm: Normal rate and regular rhythm.  Pulses: Normal pulses.  Pulmonary:     Effort: Pulmonary effort is normal.     Breath sounds: Normal breath sounds.  Abdominal:     General: Bowel sounds are normal.     Palpations: Abdomen is soft.  Skin:    General: Skin is warm and dry.  Neurological:     General: No focal deficit present.     Mental Status: She is alert. Mental status is at baseline.  Psychiatric:        Mood and Affect: Mood normal.        Behavior: Behavior normal.     Discharge Instructions  Discharge Instructions    Discharge patient   Complete by: As directed    Discharge  disposition: 01-Home or Self Care   Discharge patient date: 10/25/2018     Allergies as of 10/25/2018      Reactions   Beta-carotene Hives, Swelling   Had hives and swelling in mouth and neck area   Carrot [daucus [place]] Hives, Swelling   Allergic to carrots   Latex Rash, Hives      Medication List    TAKE these medications   acetaminophen 650 MG CR tablet Commonly known as: TYLENOL Take 650 mg by mouth every 8 (eight) hours as needed for pain.   folic acid 1 MG tablet Commonly known as: FOLVITE Take 1 mg by mouth daily.   gabapentin 300 MG capsule Commonly known as: NEURONTIN Take 1 capsule (300 mg total) by mouth 3 (three) times daily. What changed: when to take this   hydroxyurea 500 MG capsule Commonly known as: HYDREA Take 500 mg by mouth 2 (two) times a day.   ibuprofen 800 MG tablet Commonly known as: ADVIL Take 1 tablet (800 mg total) by mouth every 8 (eight) hours as needed for moderate pain.       The results of significant diagnostics from this hospitalization (including imaging, microbiology, ancillary and laboratory) are listed below for reference.    Significant Diagnostic Studies: Dg Shoulder Right  Result Date: 10/22/2018 CLINICAL DATA:  Right arm pain without known injury. History of sickle cell disease. EXAM: RIGHT SHOULDER - 2+ VIEW COMPARISON:  Radiographs of Oct 06, 2016. FINDINGS: There is no evidence of fracture or dislocation. There is no evidence of arthropathy. Stable sclerosis is seen in right humeral head concerning for avascular necrosis. Soft tissues are unremarkable. IMPRESSION: Stable sclerosis seen in right humeral head consistent with avascular necrosis. No acute abnormality is noted. Electronically Signed   By: Marijo Conception M.D.   On: 10/22/2018 19:23   Dg Chest Port 1 View  Result Date: 10/22/2018 CLINICAL DATA:  Pt is c/o sickle cell pain to chest and right arm. Smoker. EXAM: PORTABLE CHEST - 1 VIEW COMPARISON:  11/19/2017  FINDINGS: Stable right IJ power port.  Lungs are clear. Mild cardiomegaly. No effusion.  No pneumothorax. Visualized bones unremarkable. IMPRESSION: No acute cardiopulmonary disease. Electronically Signed   By: Lucrezia Europe M.D.   On: 10/22/2018 19:22    Microbiology: Recent Results (from the past 240 hour(s))  SARS Coronavirus 2 (CEPHEID - Performed in [place] hospital lab), Hosp Order     Status: None   Collection Time: 10/22/18  9:51 PM   Specimen: Nasopharyngeal Swab  Result Value Ref Range Status   SARS Coronavirus 2 NEGATIVE NEGATIVE Final    Comment: (NOTE) If result is NEGATIVE SARS-CoV-2 target nucleic acids are NOT DETECTED. The SARS-CoV-2 RNA is generally detectable in upper and  lower  respiratory specimens during the acute phase of infection. The lowest  concentration of SARS-CoV-2 viral copies this assay can detect is 250  copies / mL. A negative result does not preclude SARS-CoV-2 infection  and should not be used as the sole basis for treatment or other  patient management decisions.  A negative result may occur with  improper specimen collection / handling, submission of specimen other  than nasopharyngeal swab, presence of viral mutation(s) within the  areas targeted by this assay, and inadequate number of viral copies  (<250 copies / mL). A negative result must be combined with clinical  observations, patient history, and epidemiological information. If result is POSITIVE SARS-CoV-2 target nucleic acids are DETECTED. The SARS-CoV-2 RNA is generally detectable in upper and lower  respiratory specimens dur ing the acute phase of infection.  Positive  results are indicative of active infection with SARS-CoV-2.  Clinical  correlation with patient history and other diagnostic information is  necessary to determine patient infection status.  Positive results do  not rule out bacterial infection or co-infection with other viruses. If result is PRESUMPTIVE  POSTIVE SARS-CoV-2 nucleic acids MAY BE PRESENT.   A presumptive positive result was obtained on the submitted specimen  and confirmed on repeat testing.  While 2019 novel coronavirus  (SARS-CoV-2) nucleic acids may be present in the submitted sample  additional confirmatory testing may be necessary for epidemiological  and / or clinical management purposes  to differentiate between  SARS-CoV-2 and other Sarbecovirus currently known to infect humans.  If clinically indicated additional testing with an alternate test  methodology [phone]) is advised. The SARS-CoV-2 RNA is generally  detectable in upper and lower respiratory sp ecimens during the acute  phase of infection. The expected result is Negative. Fact Sheet for Patients:  StrictlyIdeas.no Fact Sheet for Healthcare Providers: BankingDealers.co.za This test is not yet approved or cleared by the [place] FDA and has been authorized for detection and/or diagnosis of SARS-CoV-2 by FDA under an Emergency Use Authorization (EUA).  This EUA will remain in effect (meaning this test can be used) for the duration of the COVID-19 declaration under Section 564(b)(1) of the Act, 21 U.S.C. section 360bbb-3(b)(1), unless the authorization is terminated or revoked sooner. Performed at [facility], [place] [address], [place], [place] [address]      Labs: Basic Metabolic Panel: Recent Labs  Lab 10/21/18 1800 10/22/18 1759 10/22/18 2325 10/24/18 1123  NA 138 136  --  138  K 3.9 4.0  --  4.3  CL 106 103  --  104  CO2 24 24  --  28  GLUCOSE 85 97  --  102*  BUN 13 10  --  10  CREATININE 0.69 0.57 0.67 0.55  CALCIUM 8.4* 9.1  --  8.5*   Liver Function Tests: Recent Labs  Lab 10/21/18 1800 10/22/18 1759  AST 24 28  ALT 22 31  ALKPHOS 66 62  BILITOT 1.3* 1.6*  PROT 6.7 7.6  ALBUMIN 3.8 4.2   No results for input(s): LIPASE, AMYLASE in the last 168 hours. No  results for input(s): AMMONIA in the last 168 hours. CBC: Recent Labs  Lab 10/21/18 1800 10/22/18 1759 10/22/18 2325 10/23/18 0826 10/24/18 1123  WBC 14.4* 17.3* 18.2* 17.0* 14.2*  NEUTROABS 10.1* 14.0*  --  11.5*  --   HGB 8.4* 8.6* 8.9* 8.0* 7.8*  HCT 24.7* 25.3* 25.6* 23.9* 22.9*  MCV 98.8 97.3 98.5 99.2 97.9  PLT 409*  433* 435* 371 404*   Cardiac Enzymes: No results for input(s): CKTOTAL, CKMB, CKMBINDEX, TROPONINI in the last 168 hours. BNP: Invalid input(s): POCBNP CBG: No results for input(s): GLUCAP in the last 168 hours.  Time coordinating discharge: 50 minutes  Signed:  Donia Pounds  APRN, MSN, FNP-C Patient [facility] [address] [place], [place] [address] [phone]   [facility] 10/25/2018, 12:57 PM

## 2018-10-30 ENCOUNTER — Ambulatory Visit: Payer: Self-pay | Admitting: Family Medicine

## 2018-10-30 ENCOUNTER — Encounter (HOSPITAL_COMMUNITY): Payer: Self-pay

## 2018-10-30 ENCOUNTER — Emergency Department (HOSPITAL_COMMUNITY): Payer: Medicare Other

## 2018-10-30 ENCOUNTER — Emergency Department (HOSPITAL_COMMUNITY)
Admission: EM | Admit: 2018-10-30 | Discharge: 2018-10-31 | Disposition: A | Discharge status: Home / Self Care | Payer: Medicare Other | Attending: Emergency Medicine | Admitting: Emergency Medicine

## 2018-10-30 ENCOUNTER — Other Ambulatory Visit: Payer: Self-pay

## 2018-10-30 DIAGNOSIS — T507X1A Poisoning by analeptics and opioid receptor antagonists, accidental (unintentional), initial encounter: Secondary | ICD-10-CM | POA: Diagnosis not present

## 2018-10-30 DIAGNOSIS — R1031 Right lower quadrant pain: Secondary | ICD-10-CM | POA: Diagnosis not present

## 2018-10-30 DIAGNOSIS — G8929 Other chronic pain: Secondary | ICD-10-CM | POA: Diagnosis not present

## 2018-10-30 DIAGNOSIS — R404 Transient alteration of awareness: Secondary | ICD-10-CM | POA: Diagnosis not present

## 2018-10-30 DIAGNOSIS — R1032 Left lower quadrant pain: Secondary | ICD-10-CM | POA: Diagnosis not present

## 2018-10-30 DIAGNOSIS — R1030 Lower abdominal pain, unspecified: Secondary | ICD-10-CM | POA: Diagnosis not present

## 2018-10-30 DIAGNOSIS — T50904A Poisoning by unspecified drugs, medicaments and biological substances, undetermined, initial encounter: Secondary | ICD-10-CM | POA: Diagnosis not present

## 2018-10-30 DIAGNOSIS — T887XXA Unspecified adverse effect of drug or medicament, initial encounter: Secondary | ICD-10-CM | POA: Diagnosis not present

## 2018-10-30 DIAGNOSIS — T402X1A Poisoning by other opioids, accidental (unintentional), initial encounter: Secondary | ICD-10-CM | POA: Diagnosis not present

## 2018-10-30 DIAGNOSIS — D5700 Hb-SS disease with crisis, unspecified: Secondary | ICD-10-CM

## 2018-10-30 DIAGNOSIS — G894 Chronic pain syndrome: Secondary | ICD-10-CM

## 2018-10-30 DIAGNOSIS — Z79899 Other long term (current) drug therapy: Secondary | ICD-10-CM | POA: Diagnosis not present

## 2018-10-30 DIAGNOSIS — R52 Pain, unspecified: Secondary | ICD-10-CM | POA: Diagnosis not present

## 2018-10-30 DIAGNOSIS — R4020 Unspecified coma: Secondary | ICD-10-CM | POA: Diagnosis not present

## 2018-10-30 DIAGNOSIS — T50901A Poisoning by unspecified drugs, medicaments and biological substances, accidental (unintentional), initial encounter: Secondary | ICD-10-CM

## 2018-10-30 DIAGNOSIS — F17210 Nicotine dependence, cigarettes, uncomplicated: Secondary | ICD-10-CM | POA: Diagnosis not present

## 2018-10-30 LAB — RETICULOCYTES
Immature Retic Fract: 40.8 % — ABNORMAL HIGH (ref 2.3–15.9)
RBC.: 2.56 MIL/uL — ABNORMAL LOW (ref 3.87–5.11)
Retic Count, Absolute: 272.9 10*3/uL — ABNORMAL HIGH (ref 19.0–186.0)
Retic Ct Pct: 10.7 % — ABNORMAL HIGH (ref 0.4–3.1)

## 2018-10-30 LAB — COMPREHENSIVE METABOLIC PANEL
ALT: 46 U/L — ABNORMAL HIGH (ref 0–44)
AST: 32 U/L (ref 15–41)
Albumin: 4.7 g/dL (ref 3.5–5.0)
Alkaline Phosphatase: 62 U/L (ref 38–126)
Anion gap: 9 (ref 5–15)
BUN: 12 mg/dL (ref 6–20)
CO2: 25 mmol/L (ref 22–32)
Calcium: 8.7 mg/dL — ABNORMAL LOW (ref 8.9–10.3)
Chloride: 102 mmol/L (ref 98–111)
Creatinine, Ser: 0.74 mg/dL (ref 0.44–1.00)
GFR calc Af Amer: >60 mL/min (ref >60)
GFR calc non Af Amer: >60 mL/min (ref >60)
Glucose, Bld: 111 mg/dL — ABNORMAL HIGH (ref 70–99)
Potassium: 4 mmol/L (ref 3.5–5.1)
Sodium: 136 mmol/L (ref 135–145)
Total Bilirubin: 3 mg/dL — ABNORMAL HIGH (ref 0.3–1.2)
Total Protein: 8.3 g/dL — ABNORMAL HIGH (ref 6.5–8.1)

## 2018-10-30 LAB — CBC WITH DIFFERENTIAL/PLATELET
Abs Immature Granulocytes: 0.3 10*3/uL — ABNORMAL HIGH (ref 0.00–0.07)
Basophils Absolute: 0.1 10*3/uL (ref 0.0–0.1)
Basophils Relative: 0 %
Eosinophils Absolute: 0 10*3/uL (ref 0.0–0.5)
Eosinophils Relative: 0 %
HCT: 24.6 % — ABNORMAL LOW (ref 36.0–46.0)
Hemoglobin: 8.5 g/dL — ABNORMAL LOW (ref 12.0–15.0)
Immature Granulocytes: 1 %
Lymphocytes Relative: 3 %
Lymphs Abs: 0.6 10*3/uL — ABNORMAL LOW (ref 0.7–4.0)
MCH: 33.2 pg (ref 26.0–34.0)
MCHC: 34.6 g/dL (ref 30.0–36.0)
MCV: 96.1 fL (ref 80.0–100.0)
Monocytes Absolute: 2 10*3/uL — ABNORMAL HIGH (ref 0.1–1.0)
Monocytes Relative: 9 %
Neutro Abs: 20.2 10*3/uL — ABNORMAL HIGH (ref 1.7–7.7)
Neutrophils Relative %: 87 %
Platelets: 451 10*3/uL — ABNORMAL HIGH (ref 150–400)
RBC: 2.56 MIL/uL — ABNORMAL LOW (ref 3.87–5.11)
RDW: 16.2 % — ABNORMAL HIGH (ref 11.5–15.5)
WBC: 23.1 10*3/uL — ABNORMAL HIGH (ref 4.0–10.5)
nRBC: 0.8 % — ABNORMAL HIGH (ref 0.0–0.2)

## 2018-10-30 LAB — URINALYSIS, ROUTINE W REFLEX MICROSCOPIC
Bilirubin Urine: NEGATIVE
Glucose, UA: NEGATIVE mg/dL
Hgb urine dipstick: NEGATIVE
Ketones, ur: NEGATIVE mg/dL
Leukocytes,Ua: NEGATIVE
Nitrite: NEGATIVE
Protein, ur: NEGATIVE mg/dL
Specific Gravity, Urine: 1.008 (ref 1.005–1.030)
pH: 6 (ref 5.0–8.0)

## 2018-10-30 LAB — LACTIC ACID, PLASMA: Lactic Acid, Venous: 1.7 mmol/L (ref 0.5–1.9)

## 2018-10-30 LAB — I-STAT BETA HCG BLOOD, ED (MC, WL, AP ONLY): I-stat hCG, quantitative: <5 m[IU]/mL (ref <5)

## 2018-10-30 MED ORDER — DEXTROSE-NACL 5-0.45 % IV SOLN: INTRAVENOUS | Status: DC

## 2018-10-30 MED ORDER — KETOROLAC TROMETHAMINE 30 MG/ML IJ SOLN
30.0000 mg | INTRAMUSCULAR | Status: AC
  Administered 2018-10-30: 30 mg via INTRAVENOUS
  Filled 2018-10-30: qty 1

## 2018-10-30 MED ORDER — DIPHENHYDRAMINE HCL 25 MG PO CAPS: 25.0000 mg | ORAL_CAPSULE | ORAL | Status: DC | PRN

## 2018-10-30 MED ORDER — GABAPENTIN 300 MG PO CAPS
300.0000 mg | ORAL_CAPSULE | Freq: Once | ORAL | Status: AC
  Administered 2018-10-30: 300 mg via ORAL
  Filled 2018-10-30: qty 1

## 2018-10-30 MED ORDER — ONDANSETRON HCL 4 MG/2ML IJ SOLN: 4.0000 mg | INTRAMUSCULAR | Status: DC | PRN

## 2018-10-30 MED ORDER — ACETAMINOPHEN 500 MG PO TABS
1000.0000 mg | ORAL_TABLET | Freq: Once | ORAL | Status: AC
  Administered 2018-10-30: 1000 mg via ORAL
  Filled 2018-10-30: qty 2

## 2018-10-30 MED ORDER — DEXTROSE 5 % AND 0.45 % NACL IV BOLUS
1000.0000 mL | Freq: Once | INTRAVENOUS | Status: AC
  Administered 2018-10-30: 1000 mL via INTRAVENOUS

## 2018-10-30 NOTE — ED Notes (Signed)
Pt back from radiology 

## 2018-10-30 NOTE — ED Notes (Signed)
Bed: SH38 Expected date:  Expected time:  Means of arrival:  Comments: EMS 27yo OD oxy narcan given

## 2018-10-30 NOTE — ED Notes (Addendum)
Patient transported to CT 

## 2018-10-30 NOTE — ED Notes (Signed)
Called Peer Support for PA Claudia @[phone] 

## 2018-10-30 NOTE — ED Triage Notes (Signed)
Per ems: Pt coming from convenient store c/o overdose of oxycodone and passed out on the floor. Pt given 2mg  of narcan by ems and temporarily bagged. Pt A&Ox4 now and ambulatory

## 2018-10-30 NOTE — ED Notes (Signed)
Pt stated "she is afraid to fall asleep"

## 2018-10-30 NOTE — ED Notes (Signed)
Urine and culture sent to lab  

## 2018-10-30 NOTE — ED Notes (Addendum)
Pt tolerating fluids with no reports of emesis

## 2018-10-30 NOTE — ED Provider Notes (Signed)
[facility] DEPT Provider Note   CSN: [id number] Arrival date & time: 10/30/18  1914    History   Chief Complaint Chief Complaint  Patient presents with   Drug Overdose    HPI [name] is a 28 y.o. female with h/o sickle cell anemia, chronic pain syndrome, GERD brought to the ER by EMS for possible drug overdose. Per triage EMS was called to convenience store for patient being passed out on the floor. Pt was given 2mg  narcan and temporarily bagged. Pt arrives awake, alert and oriented, ambulatory. She is very upset, teary eyed and admits that she purchased three 20 mg oxycodones from the street to treat her sickle cell pain which began on Saturday. Pain is in her flanks bilaterally L>R, worse with movement, sitting up, palpation. States her sickle cell pain can be all over her body, and has been in her flank before.  She denies any fever, chills, nausea, vomiting, urinary symptoms, changes in BMs, fall or back injury.  States she has been "clean" of oral narcotics for 10 months and she is very upset that she relapsed again. She has h/o sickle cell and states in the past she used to abuse her prescribed oral narcotic pain medicines at home, so for the last 10 months she has only been treating her pain with ibuprofen, tylenol and gabapentin. She has refued prescriptions for narcotics to use at home and comes to the hospital when she can't control her pain with ibuprofen, tylenol and gabapentin. She was admitted for sickle cell pain control last week and was discharged on Friday. She started having return of sickle cell pain in her bilateral flanks on Saturday. She didn't want to miss cosmetology classes and return to the hospital so she bought oxycodones from the streets to control her pain.   She denies any CP, SOB. Denies any other co-ingestants. She denies suicidal ideation and attempt, states she is "very happy" with her life. She would like mental help to  help with her stress. No HI. No AVH.      HPI  Past Medical History:  Diagnosis Date   Blood transfusion    "lots"   Chronic back pain    "very severe; have knot in my back; from tight muscle; take RX and exercise for it"   Depression 01/06/2011   Exertional dyspnea    "sometimes"   Genital HSV    GERD (gastroesophageal reflux disease) 02/17/2011   Migraines 11/08/11   "@ least twice/month"   Miscarriage 03/22/2011   Pt reports 2 miscarriages.   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    Past Surgical History:  Procedure Laterality Date   CHOLECYSTECTOMY  05/2010   DILATION AND CURETTAGE OF UTERUS  02/20/11   S/P miscarriage   IR GENERIC HISTORICAL  12/23/2015   IR FLUORO GUIDE CV LINE RIGHT 12/23/2015 Jacqulynn Cadet, MD [facility]   IR GENERIC HISTORICAL  12/23/2015   IR US GUIDE VASC ACCESS RIGHT 12/23/2015 Jacqulynn Cadet, MD [facility]     OB History    Gravida  5   Para  2   Term  2   Preterm  0   AB  3   Living  2     SAB  3   TAB    0   Ectopic  0   Multiple  0   Live Births  2        Obstetric Comments  Miscarried in October 2012 at about 7 weeks         Home Medications    Prior to Admission medications   Medication Sig Start Date End Date Taking? Authorizing Provider  acetaminophen (TYLENOL) 650 MG CR tablet Take 650 mg by mouth every 8 (eight) hours as needed for pain.   Yes [provider]  folic acid (FOLVITE) 1 MG tablet Take 1 mg by mouth daily.   Yes [provider]  gabapentin (NEURONTIN) 300 MG capsule Take 1 capsule (300 mg total) by mouth 3 (three) times daily. Patient taking differently: Take 300 mg by mouth 2 (two) times daily.  09/20/18  Yes Dorena Dew, FNP  hydroxyurea (HYDREA) 500 MG capsule Take 500 mg by mouth 2 (two) times a day.   Yes [provider]  ibuprofen (ADVIL) 800 MG tablet Take 1 tablet (800 mg total) by mouth every 8 (eight) hours as needed for moderate pain. 09/17/18  Yes Long, Wonda Olds, MD  ondansetron (ZOFRAN) 4 MG tablet Take 1 tablet (4 mg total) by mouth daily as needed for nausea or vomiting. 10/25/18 10/25/19 Yes Dorena Dew, FNP    Family History Family History  Problem Relation Age of Onset   Sickle cell trait Mother    Sickle cell trait Father    Diabetes Maternal Grandmother    Diabetes Paternal Grandmother    Hypertension Paternal Grandmother    Diabetes Maternal Grandfather     Social History Social History   Tobacco Use   Smoking status: Current Some Day Smoker    Packs/day: 0.25    Years: 1.00    Pack years: 0.25    Types: Cigarettes    Last attempt to quit: 03/25/2013    Years since quitting: 5.6   Smokeless tobacco: Never Used  Substance Use Topics   Alcohol use: No    Comment: denies   Drug use: No    Types: Other-see comments    Comment: "Clean for 3 months"     Allergies   Beta-carotene, Carrot [daucus carota], and Latex   Review of Systems Review of Systems  Genitourinary:  Positive for flank pain.  All other systems reviewed and are negative.    Physical Exam Updated Vital Signs BP 106/75    Pulse 94    Temp 98.5 F (36.9 C) (Oral)    Resp 14    LMP 10/21/2018    SpO2 95%   Physical Exam Vitals signs and nursing note reviewed.  Constitutional:      Appearance:  She is well-developed.     Comments: Non toxic in NAD  HENT:     Head: Normocephalic and atraumatic.     Nose: Nose normal.  Eyes:     Conjunctiva/sclera: Conjunctivae normal.  Neck:     Musculoskeletal: Normal range of motion.  Cardiovascular:     Rate and Rhythm: Normal rate and regular rhythm.     Comments: 1+ radial and DP pulses bilaterally. No LE edema. No calf tenderness Pulmonary:     Effort: Pulmonary effort is normal.     Breath sounds: Normal breath sounds.  Abdominal:     General: Bowel sounds are normal.     Palpations: Abdomen is soft.     Tenderness: There is abdominal tenderness.     Comments: Bilateral CVAT with percussion, palpation, movements. No G/R/R. No suprapubic tenderness. Negative Murphy's and McBurney's. Active BS to lower quadrants.   Musculoskeletal: Normal range of motion.        General: Tenderness present.     Comments: TL spine: bilateral muscular tenderness. Pain with movements. No ecchymosis. No midline tenderness.   Skin:    General: Skin is warm and dry.     Capillary Refill: Capillary refill takes less than 2 seconds.  Neurological:     Mental Status: She is alert.     Comments: Sensation and strength intact in all extremities   Psychiatric:        Behavior: Behavior normal.      ED Treatments / Results  Labs (all labs ordered are listed, but only abnormal results are displayed) Labs Reviewed  CBC WITH DIFFERENTIAL/PLATELET - Abnormal; Notable for the following components:      Result Value   WBC 23.1 (*)    RBC 2.56 (*)    Hemoglobin 8.5 (*)    HCT 24.6 (*)    RDW 16.2 (*)    Platelets 451 (*)    nRBC 0.8 (*)    Neutro Abs 20.2  (*)    Lymphs Abs 0.6 (*)    Monocytes Absolute 2.0 (*)    Abs Immature Granulocytes 0.30 (*)    All other components within normal limits  RETICULOCYTES - Abnormal; Notable for the following components:   Retic Ct Pct 10.7 (*)    RBC. 2.56 (*)    Retic Count, Absolute 272.9 (*)    Immature Retic Fract 40.8 (*)    All other components within normal limits  COMPREHENSIVE METABOLIC PANEL - Abnormal; Notable for the following components:   Glucose, Bld 111 (*)    Calcium 8.7 (*)    Total Protein 8.3 (*)    ALT 46 (*)    Total Bilirubin 3.0 (*)    All other components within normal limits  URINALYSIS, ROUTINE W REFLEX MICROSCOPIC  LACTIC ACID, PLASMA  LACTIC ACID, PLASMA  I-STAT BETA HCG BLOOD, ED ([facility], [facility], [facility] ONLY)    EKG None  Radiology Ct Renal Stone Study  Result Date: 10/30/2018 CLINICAL DATA:  Bilateral flank pain, left greater than right. Sickle cell anemia. EXAM: CT ABDOMEN AND PELVIS WITHOUT CONTRAST TECHNIQUE: Multidetector CT imaging of the abdomen and pelvis was performed following the standard protocol without IV contrast. COMPARISON:  07/04/2017 FINDINGS: Lower chest: Lung bases are clear. No effusions. Heart is normal size. Hepatobiliary: No focal liver abnormality is seen. Status post cholecystectomy. No biliary dilatation. Pancreas: No focal abnormality or ductal dilatation. Spleen: Lobular contours throughout the spleen with multiple cystic areas, stable compared to multiple prior studies. Adrenals/Urinary Tract:  No adrenal abnormality. No focal renal abnormality. No stones or hydronephrosis. Urinary bladder is unremarkable. Stomach/Bowel: Stomach, large and small bowel grossly unremarkable. Vascular/Lymphatic: Normal caliber aorta. Mildly prominent right lower quadrant mesenteric lymph nodes. No retroperitoneal adenopathy. Reproductive: Uterus and adnexa unremarkable.  No mass. Other: No free fluid or free air. Musculoskeletal: Areas of sclerosis and lucency within the  bony structures compatible with sickle cell changes. IMPRESSION: No evidence of renal or ureteral stones.  No hydronephrosis. Stable appearance of the spleen with lobular contours and numerous hypodensities throughout the spleen. Prior cholecystectomy. Bony changes of sickle cell disease. Electronically Signed   By: Rolm Baptise M.D.   On: 10/30/2018 23:00    Procedures Procedures (including critical care time)  Medications Ordered in ED Medications  ondansetron (ZOFRAN) injection 4 mg (has no administration in time range)  diphenhydrAMINE (BENADRYL) capsule 25 mg (has no administration in time range)  ketorolac (TORADOL) 30 MG/ML injection 30 mg (30 mg Intravenous Given 10/30/18 2053)  acetaminophen (TYLENOL) tablet 1,000 mg (1,000 mg Oral Given 10/30/18 2118)  gabapentin (NEURONTIN) capsule 300 mg (300 mg Oral Given 10/30/18 2118)  dextrose 5 % and 0.45% NaCl 5-0.45 % bolus 1,000 mL (0 mLs Intravenous Stopped 10/30/18 2158)     Initial Impression / Assessment and Plan / ED Course  I have reviewed the triage vital signs and the nursing notes.  Pertinent labs & imaging results that were available during my care of the patient were reviewed by me and considered in my medical decision making (see chart for details).  Clinical Course as of Oct 31 14  Tue Oct 30, 2018  2126 Immature Retic Fract(!): 40.8 [CG]  2126 Hemoglobin(!): 8.5 [CG]  2324 IMPRESSION: No evidence of renal or ureteral stones. No hydronephrosis.  Stable appearance of the spleen with lobular contours and numerous hypodensities throughout the spleen.  Prior cholecystectomy.  Bony changes of sickle cell disease.  CT Renal Stone Study [CG]  Wed Oct 31, 2018  0006 Chronic leukocytosis usually 15-18, slightly higher today but no fever, lactic acidosis other signs of infectious process. Tachycardia has improved significantly.  WBC(!): 23.1 [CG]  0007 Retic Ct Pct(!): 10.7 [CG]  0008 Retic Count, Absolute(!): 272.9  [CG]    Clinical Course User Index [CG] Kinnie Feil, PA-C      28 year old F with history of sickle cell anemia, chronic pain syndrome brought to the ER for unintentional drug overdose.  She arrives fully oriented, ambulatory, cooperative.  She admits of previous opioid addiction. Before of this she does not use narcotic pain meds at home and treats sickle cell pain with ibuprofen, tylenol gabapentin. She had break through sickle cell pain and bought oxycodone in the streets which she took earlier today. Denies SI, HI, attempt to harm herself.   Exam reveals reproducible bilateral flank/thoracic muscular tenderness. Initially tachycardic, and tachypnic likely related to narcan administration. She was very upset, crying initially likely contributed.  VS normalized while in the ER.  Work up reveals leukocytosis, in setting of chronic leukocytosis only slightly elevated today. No fever.  Tachycardia has resolved.  No lactic acidosis.  She has no urinary symptoms however given flank pain and leukocytosis, CT renal was obtained which was negative for stone, hydronephrosis.  She has no cough or other infectious respiratory symptoms to warrant more extensive infectious work up.   Sickle cell pain treated with other modalities here, improved. Patient was observed in ER for > 4 hours without clinical decline after narcan. She is  tolerating PO. Ambulatory.  OP substance abuse and mental health counseling resources given. I spoke to Peer support who will contact her tomorrow for more resources. Return precautions given.  Final Clinical Impressions(s) / ED Diagnoses   Final diagnoses:  Acute drug overdose, accidental or unintentional, initial encounter  Chronic pain syndrome  Sickle cell anemia with crisis [facility])    ED Discharge Orders    None       Kinnie Feil, PA-C 10/31/18 0016    Blanchie Dessert, MD 10/31/18 2251

## 2018-10-30 NOTE — ED Notes (Signed)
Pt aware that urine sample is needed.  

## 2018-10-31 ENCOUNTER — Other Ambulatory Visit: Payer: Self-pay

## 2018-10-31 DIAGNOSIS — T507X1A Poisoning by analeptics and opioid receptor antagonists, accidental (unintentional), initial encounter: Secondary | ICD-10-CM | POA: Diagnosis not present

## 2018-10-31 MED ORDER — HEPARIN SOD (PORK) LOCK FLUSH 100 UNIT/ML IV SOLN
500.0000 [IU] | Freq: Once | INTRAVENOUS | Status: AC
  Administered 2018-10-31: 500 [IU]
  Filled 2018-10-31: qty 5

## 2018-10-31 NOTE — ED Notes (Signed)
Pt verbalized discharge instructions and follow up care. Alert and ambulatory. Port deaccessed. Pt is being picked up by friend

## 2018-10-31 NOTE — Discharge Instructions (Addendum)
You were seen in the ER for unintentional drug overdose  Work up showed elevated white blood cell count but no sources of infection were found  I attempted to get our peer support and social work to give your community resources but they were not available at this time in the ER.  Go to [facility] outpatient [facility] for mental health and substance abuse care.  See other resources attached, call and go to a facility that has inpatient and/or outpatient detox services   [facility] [address] [place], [place] [address] [place]  [place]  [place] [address] [phone]   [facility]   [address], [place] [address]  [phone]  [facility]  [address] [place], [place] [address]  [phone]

## 2018-10-31 NOTE — Patient Outreach (Signed)
[facility]) Care Management  10/31/2018  KHANIYA TENAGLIA 08/02/1990 [id number]  EMMI: General discharge red alert Referral date: 10/31/18 Referral reason: Sad/ hopeless/ anxious/ empty Insurance:  Medicare Day # 4  Attempt #1  Telephone call to patient regarding EMMI general discharge red alert. Unable to reach patient or leave voice message.  Message states patient has a voice mail box that is not set up yet.   PLAN: RNCM will attempt 2nd telephone call to patient within 4 business days RNCM will send patient outreach letter to attempt contact  [name] RN,BSN,CCM [facility] Telephonic  [phone]

## 2018-11-01 ENCOUNTER — Other Ambulatory Visit: Payer: Self-pay

## 2018-11-01 NOTE — Patient Outreach (Signed)
[facility]) Care Management  11/01/2018  [name] 12-16-1990 [id number]  EMMI: General discharge red alert Referral date: 10/31/18 Referral reason: Sad/ hopeless/ anxious/ empty Insurance:  Medicare Day # 4  Telephone call to patient regarding EMMI general discharge red alert. HIPAA verified with patient. RNCM introduced herself and explained reason for call. Patient states she is not feeling sad / hopeless/ anxious / empty. She states she is in school, managing and trying to stay positive. RNCM discussed and offered [facility] care management services.  Patient declined.  Patient states she has a follow up appointment scheduled with her primary provider at the sickle cell center on 11/06/18.  She reports having transportation to her appointments. Patient denies any ongoing symptoms at this time. Patient states she is taking her medications as prescribed.  Patient denies any further needs or concerns at this time.   RNCM advised patient to notify MD of any changes in condition prior to scheduled appointment. RNCM provided contact name and number: [phone] or main office number [phone] and 24 hour nurse advise line [phone] by mail . Patient confirmed mailing address RNCM verified patient aware of 911 services for urgent/ emergent needs.  PLAN:  RNCM will close case due to patient being assessed and having no further needs.  RNCM will mail patient [facility] care management brochure/ magnet  [name] RN,BSN,CCM [facility] Telephonic  [phone]

## 2018-11-06 ENCOUNTER — Ambulatory Visit (INDEPENDENT_AMBULATORY_CARE_PROVIDER_SITE_OTHER): Payer: Medicare Other | Admitting: Family Medicine

## 2018-11-06 ENCOUNTER — Other Ambulatory Visit: Payer: Self-pay

## 2018-11-06 ENCOUNTER — Encounter: Payer: Self-pay | Admitting: Family Medicine

## 2018-11-06 VITALS — BP 109/55 | HR 87 | Temp 98.9°F | Resp 16 | Ht 61.0 in | Wt 204.0 lb

## 2018-11-06 DIAGNOSIS — D571 Sickle-cell disease without crisis: Secondary | ICD-10-CM

## 2018-11-06 DIAGNOSIS — M879 Osteonecrosis, unspecified: Secondary | ICD-10-CM

## 2018-11-06 DIAGNOSIS — G894 Chronic pain syndrome: Secondary | ICD-10-CM

## 2018-11-06 DIAGNOSIS — I2699 Other pulmonary embolism without acute cor pulmonale: Secondary | ICD-10-CM

## 2018-11-06 DIAGNOSIS — F11259 Opioid dependence with opioid-induced psychotic disorder, unspecified: Secondary | ICD-10-CM

## 2018-11-06 DIAGNOSIS — E559 Vitamin D deficiency, unspecified: Secondary | ICD-10-CM

## 2018-11-06 LAB — POCT URINALYSIS DIPSTICK
Bilirubin, UA: NEGATIVE
Blood, UA: NEGATIVE
Glucose, UA: NEGATIVE
Ketones, UA: NEGATIVE
Leukocytes, UA: NEGATIVE
Nitrite, UA: NEGATIVE
Protein, UA: NEGATIVE
Spec Grav, UA: 1.015 (ref 1.010–1.025)
Urobilinogen, UA: 0.2 E.U./dL
pH, UA: 5.5 (ref 5.0–8.0)

## 2018-11-06 MED ORDER — GABAPENTIN 300 MG PO CAPS: 300.0000 mg | ORAL_CAPSULE | Freq: Three times a day (TID) | ORAL | 0 refills | Status: AC

## 2018-11-06 NOTE — Patient Instructions (Signed)
Follow up over the next several weeks for birth control options and pap smear.    Sickle Cell Anemia, Adult Sickle cell anemia is a condition where your red blood cells are shaped like sickles. Red blood cells carry oxygen through the body. Sickle-shaped cells do not live as long as normal red blood cells. They also clump together and block blood from flowing through the blood vessels. This prevents the body from getting enough oxygen. Sickle cell anemia causes organ damage and pain. It also increases the risk of infection. Follow these instructions at home: Medicines  Take over-the-counter and prescription medicines only as told by your doctor.  If you were prescribed an antibiotic medicine, take it as told by your doctor. Do not stop taking the antibiotic even if you start to feel better.  If you develop a fever, do not take medicines to lower the fever right away. Tell your doctor about the fever. Managing pain, stiffness, and swelling  Try these methods to help with pain: ? Use a heating pad. ? Take a warm bath. ? Distract yourself, such as by watching TV. Eating and drinking  Drink enough fluid to keep your pee (urine) clear or pale yellow. Drink more in hot weather and during exercise.  Limit or avoid alcohol.  Eat a healthy diet. Eat plenty of fruits, vegetables, whole grains, and lean protein.  Take vitamins and supplements as told by your doctor. Traveling  When traveling, keep these with you: ? Your medical information. ? The names of your doctors. ? Your medicines.  If you need to take an airplane, talk to your doctor first. Activity  Rest often.  Avoid exercises that make your heart beat much faster, such as jogging. General instructions  Do not use products that have nicotine or tobacco, such as cigarettes and e-cigarettes. If you need help quitting, ask your doctor.  Consider wearing a medical alert bracelet.  Avoid being in high places (high altitudes),  such as mountains.  Avoid very hot or cold temperatures.  Avoid places where the temperature changes a lot.  Keep all follow-up visits as told by your doctor. This is important. Contact a doctor if:  A joint hurts.  Your feet or hands hurt or swell.  You feel tired (fatigued). Get help right away if:  You have symptoms of infection. These include: ? Fever. ? Chills. ? Being very tired. ? Irritability. ? Poor eating. ? Throwing up (vomiting).  You feel dizzy or faint.  You have new stomach pain, especially on the left side.  You have a an erection (priapism) that lasts more than 4 hours.  You have numbness in your arms or legs.  You have a hard time moving your arms or legs.  You have trouble talking.  You have pain that does not go away when you take medicine.  You are short of breath.  You are breathing fast.  You have a long-term cough.  You have pain in your chest.  You have a bad headache.  You have a stiff neck.  Your stomach looks bloated even though you did not eat much.  Your skin is pale.  You suddenly cannot see well. Summary  Sickle cell anemia is a condition where your red blood cells are shaped like sickles.  Follow your doctor's advice on ways to manage pain, food to eat, activities to do, and steps to take for safe travel.  Get medical help right away if you have any signs of infection,  such as a fever. This information is not intended to replace advice given to you by your health care provider. Make sure you discuss any questions you have with your health care provider. Document Released: 02/20/2013 Document Revised: 06/07/2016 Document Reviewed: 06/07/2016 Elsevier Interactive Patient Education  2019 [employer].  Hormonal Contraception Information Hormonal contraception is a type of birth control that uses hormones to prevent pregnancy. It usually involves a combination of the hormones estrogen and progesterone or only the hormone  progesterone. Hormonal contraception works in these ways:  It thickens the mucus in the cervix, making it harder for sperm to enter the uterus.  It changes the lining of the uterus, making it harder for an egg to implant.  It may stop the ovaries from releasing eggs (ovulation). Some women who take hormonal contraceptives that contain only progesterone may continue to ovulate. Hormonal contraception cannot prevent sexually transmitted infections (STIs). Pregnancy may still occur. Estrogen and progesterone contraceptives Contraceptives that use a combination of estrogen and progesterone are available in these forms:  Pill. Pills come in different combinations of hormones. They must be taken at the same time each day. Pills can affect your period, causing you to get your period once every three months or not at all.  Patch. The patch must be worn on the lower abdomen for three weeks and then removed on the fourth.  Vaginal ring. The ring is placed in the vagina and left there for three weeks. It is then removed for one week. Progesterone contraceptives Contraceptives that use progesterone only are available in these forms:  Pill. Pills should be taken every day of the cycle.  Intrauterine device (IUD). This device is inserted into the uterus and removed or replaced every five years or sooner.  Implant. Plastic rods are placed under the skin of the upper arm. They are removed or replaced every three years or sooner.  Injection. The injection is given once every 90 days. What are the side effects? The side effects of estrogen and progesterone contraceptives include:  Nausea.  Headaches.  Breast tenderness.  Bleeding or spotting between menstrual cycles.  High blood pressure (rare).  Strokes, heart attacks, or blood clots (rare) Side effects of progesterone-only contraceptives include:  Nausea.  Headaches.  Breast tenderness.  Unpredictable menstrual bleeding.  High blood  pressure (rare). Talk to your health care provider about what side effects may affect you. Where to find more information  Ask your health care provider for more information and resources about hormonal contraception.  U.[place]. Department of Health and Programmer, systems on Women's Health: [place] Questions to ask:  What type of hormonal contraception is right for me?  How long should I plan to use hormonal contraception?  What are the side effects of the hormonal contraception method I choose?  How can I prevent STIs while using hormonal contraception? Contact a health care provider if:  You start taking hormonal contraceptives and you develop persistent or severe side effects. Summary  Estrogen and progesterone are hormones used in many forms of birth control.  Talk to your health care provider about what side effects may affect you.  Hormonal contraception cannot prevent sexually transmitted infections (STIs).  Ask your health care provider for more information and resources about hormonal contraception. This information is not intended to replace advice given to you by your health care provider. Make sure you discuss any questions you have with your health care provider. Document Released: 05/22/2007 Document Revised: 12/07/2017 Document Reviewed: 04/01/2016 Elsevier  Interactive Patient Education  [employer].

## 2018-11-06 NOTE — Progress Notes (Signed)
Patient Care Team: Dorena Dew, FNP as PCP - General (Family Medicine)    Subjective:    Patient ID: [name], female    DOB: February 03, 1991, 28 y.o.   MRN: [id number]  HPI [name], a 28 year old female with a medical history significant for sickle cell disease, type SS, chronic pain syndrome, opiate dependence, history of genital HSV, history of anemia of chronic disease, and history of trichotillomania presents to establish care. States that she has not had a PCP in greater than 1 year due to a recent incarceration.  Patient states that during incarceration she was without opiate medications.  She currently manages pain with gabapentin and ibuprofen.  Patient denies any pain on today.  She was recently hospitalized for sickle cell pain crisis that was triggered by increased stress and changes in weather. Patient has recently established care with hematologist.  Hydroxyurea and folic acid have been started.  Patient states that she has been taking medications consistently.  Patient is currently not sexually active.  She does not perform monthly self breast exams, she states that it is been around 2 years since last Pap smear.  She currently denies headache, chest pain, shortness of breath, dysuria, nausea, vomiting, or diarrhea.  Past Medical History:  Diagnosis Date  . Blood transfusion    "lots"  . Chronic back pain    "very severe; have knot in my back; from tight muscle; take RX and exercise for it"  . Depression 01/06/2011  . Exertional dyspnea    "sometimes"  . Genital HSV   . GERD (gastroesophageal reflux disease) 02/17/2011  . Migraines 11/08/11   "@ least twice/month"  . 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  . Mood swings 11/08/11   "I go back and forth; real bad"  . Sickle cell anemia ([name])   . Sickle cell anemia with crisis ([place])   . Thrombocytosis ([place]) 11/09/2015  . Trichotillomania    h/o   Immunization History  Administered Date(s)  Administered  . Influenza,inj,Quad PF,6+ Mos 02/26/2013, 02/19/2014  . MMR 12/10/2014  . Pneumococcal-Unspecified 02/14/2011  . Tdap 12/10/2014, 08/11/2015   Social History   Socioeconomic History  . Marital status: Single    Spouse name: Not on file  . Number of children: Not on file  . Years of education: Not on file  . Highest education level: Not on file  Occupational History  . Occupation: disabled  Social Needs  . Financial resource strain: Not on file  . Food insecurity    Worry: Not on file    Inability: Not on file  . Transportation needs    Medical: Not on file    Non-medical: Not on file  Tobacco Use  . Smoking status: Current Some Day Smoker    Packs/day: 0.25    Years: 1.00    Pack years: 0.25    Types: Cigarettes    Last attempt to quit: 03/25/2013    Years since quitting: 5.6  . Smokeless tobacco: Never Used  Substance and Sexual Activity  . Alcohol use: No    Comment: denies  . Drug use: No    Types: Other-see comments    Comment: "Clean for 3 months"  . Sexual activity: Yes    Partners: Male    Birth control/protection: None    Comment: last sex Jul 11 2015  Lifestyle  . Physical activity    Days per week: Not on file    Minutes per session: Not on file  .  Stress: Not on file  Relationships  . Social Herbalist on phone: Not on file    Gets together: Not on file    Attends religious service: Not on file    Active member of club or organization: Not on file    Attends meetings of clubs or organizations: Not on file    Relationship status: Not on file  . Intimate partner violence    Fear of current or ex partner: Not on file    Emotionally abused: Not on file    Physically abused: Not on file    Forced sexual activity: Not on file  Other Topics Concern  . 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 CHOLECYSTECTOMY  05/2010  . DILATION AND CURETTAGE OF UTERUS  02/20/11   S/P miscarriage  . IR GENERIC HISTORICAL  12/23/2015   IR FLUORO GUIDE CV LINE RIGHT 12/23/2015 Jacqulynn Cadet, MD [facility]  . IR GENERIC HISTORICAL  12/23/2015   IR US GUIDE VASC ACCESS RIGHT 12/23/2015 Jacqulynn Cadet, MD [facility]   Review of Systems  Constitutional: Negative for activity change and appetite change.  HENT: Negative for congestion.   Eyes: Negative.   Respiratory: Negative.   Cardiovascular: Negative for chest pain and leg swelling.  Gastrointestinal: Negative.   Endocrine: Negative for polydipsia and polyphagia.  Genitourinary: Negative.   Musculoskeletal: Negative.   Skin: Negative.   Neurological: Negative.   Hematological: Negative.   Psychiatric/Behavioral: Negative.        Objective:   Physical Exam Constitutional:      Appearance: She is obese.  HENT:     Head: Normocephalic.     Mouth/Throat:     Mouth: Mucous membranes are moist.  Eyes:     Pupils: Pupils are equal, round, and reactive to light.  Cardiovascular:     Rate and Rhythm: Normal rate and regular rhythm.     Pulses: Normal pulses.     Heart sounds: Normal heart sounds.  Pulmonary:     Effort: Pulmonary effort is normal.     Breath sounds: Normal breath sounds.  Abdominal:     General: Abdomen is flat. Bowel sounds are normal.  Musculoskeletal: Normal range of motion.  Skin:    General: Skin is warm and dry.  Neurological:     General: No focal deficit present.     Mental Status: She is alert.  Psychiatric:        Mood and Affect: Mood normal.        Behavior: Behavior normal.        Thought Content: Thought content normal.        Judgment: Judgment normal.          BP (!) 109/55 (BP Location: Left Arm, Patient Position: Sitting, Cuff Size: Large)   Pulse 87   Temp 98.9 F (37.2 C) (Oral)   Resp 16   Ht 5' 1"  (1.549 m)   Wt 204 lb (92.5 kg)   LMP 10/21/2018    SpO2 100%   BMI 38.55 kg/m   Assessment & Plan:   Hb-SS disease without crisis ([name]) Sickle cell disease - Continue Hydrea.  We discussed the need for good hydration, monitoring of hydration status, avoidance of heat, cold, stress, and infection triggers. We discussed  the risks and benefits of Hydrea, including bone marrow suppression, the possibility of GI upset, skin ulcers, hair thinning, and teratogenicity. The patient was reminded of the need to seek medical attention of any symptoms of bleeding, anemia, or infection. Continue folic acid 1 mg daily to prevent aplastic bone marrow crises.   Pulmonary evaluation - Patient denies severe recurrent wheezes, shortness of breath with exercise, or persistent cough. If these symptoms develop, pulmonary function tests with spirometry will be ordered, and if abnormal, plan on referral to Pulmonology for further evaluation.  Cardiac - Routine screening for pulmonary hypertension is not recommended.  Eye - High risk of proliferative retinopathy. Annual eye exam with retinal exam recommended to patient.   Immunization status - She declines vaccines today. Yearly influenza vaccination is recommended, as well as being up to date with Meningococcal and Pneumococcal vaccines.    Acute and chronic painful episodes -  Patient was weaned from opiate medications during previous incarceration. We have agreed not to restart opiates.  Patient will continue gabapentin 300 mg 3 times daily and ibuprofen 800 mg every 8 hours as needed.  The above recommendations are taken from the NIH Evidence-Based Management of Sickle Cell Disease: Expert Panel Report, 20149.   - Urinalysis Dipstick - gabapentin (NEURONTIN) 300 MG capsule; Take 1 capsule (300 mg total) by mouth 3 (three) times daily.  Dispense: 90 capsule; Refill: 0 - CBC with Differential - Comprehensive metabolic panel - Ferritin  Health maintenance: Patient will warrant Pap smear in December  2020 Patient is interested in starting birth control due to the fact that her menstrual cycles typically trigger sickle cell pain crises.  Patient given written information on oral and injectable birth control.  Patient will follow-up in 2 weeks to discuss possible birth control plan.  She is currently not sexually active.  Recommend that if she become sexually active, using barrier protection. Patient is up-to-date with all vaccinations. Patient recommended to start a low-fat, low carbohydrate diet divided over 5-6 small meals throughout the day. Also, recommend low impact exercise   RTC: 2 weeks  Donia Pounds  APRN, MSN, FNP-C [facility] [address] [place], [place] [address] [phone]

## 2018-11-10 LAB — CBC WITH DIFFERENTIAL/PLATELET
Basophils Absolute: 0.1 10*3/uL (ref 0.0–0.2)
Basos: 1 %
EOS (ABSOLUTE): 0.2 10*3/uL (ref 0.0–0.4)
Eos: 2 %
Hematocrit: 27 % — ABNORMAL LOW (ref 34.0–46.6)
Hemoglobin: 9.1 g/dL — ABNORMAL LOW (ref 11.1–15.9)
Immature Grans (Abs): 0 10*3/uL (ref 0.0–0.1)
Immature Granulocytes: 0 %
Lymphocytes Absolute: 1.9 10*3/uL (ref 0.7–3.1)
Lymphs: 21 %
MCH: 32.6 pg (ref 26.6–33.0)
MCHC: 33.7 g/dL (ref 31.5–35.7)
MCV: 97 fL (ref 79–97)
Monocytes Absolute: 1 10*3/uL — ABNORMAL HIGH (ref 0.1–0.9)
Monocytes: 11 %
NRBC: 1 % — ABNORMAL HIGH (ref 0–0)
Neutrophils Absolute: 6 10*3/uL (ref 1.4–7.0)
Neutrophils: 65 %
Platelets: 549 10*3/uL — ABNORMAL HIGH (ref 150–450)
RBC: 2.79 x10E6/uL — ABNORMAL LOW (ref 3.77–5.28)
RDW: 17.4 % — ABNORMAL HIGH (ref 11.7–15.4)
WBC: 9.1 10*3/uL (ref 3.4–10.8)

## 2018-11-10 LAB — VITAMIN D 1,25 DIHYDROXY
Vitamin D 1, 25 (OH)2 Total: 62 pg/mL
Vitamin D2 1, 25 (OH)2: <10 pg/mL
Vitamin D3 1, 25 (OH)2: 62 pg/mL

## 2018-11-10 LAB — COMPREHENSIVE METABOLIC PANEL
ALT: 16 IU/L (ref 0–32)
AST: 22 IU/L (ref 0–40)
Albumin/Globulin Ratio: 1.6 (ref 1.2–2.2)
Albumin: 4.4 g/dL (ref 3.9–5.0)
Alkaline Phosphatase: 65 IU/L (ref 39–117)
BUN/Creatinine Ratio: 14 (ref 9–23)
BUN: 10 mg/dL (ref 6–20)
Bilirubin Total: 1.3 mg/dL — ABNORMAL HIGH (ref 0.0–1.2)
CO2: 21 mmol/L (ref 20–29)
Calcium: 9.1 mg/dL (ref 8.7–10.2)
Chloride: 103 mmol/L (ref 96–106)
Creatinine, Ser: 0.72 mg/dL (ref 0.57–1.00)
GFR calc Af Amer: 134 mL/min/{1.73_m2} (ref >59)
GFR calc non Af Amer: 116 mL/min/{1.73_m2} (ref >59)
Globulin, Total: 2.8 g/dL (ref 1.5–4.5)
Glucose: 92 mg/dL (ref 65–99)
Potassium: 4.6 mmol/L (ref 3.5–5.2)
Sodium: 140 mmol/L (ref 134–144)
Total Protein: 7.2 g/dL (ref 6.0–8.5)

## 2018-11-10 LAB — FERRITIN: Ferritin: 640 ng/mL — ABNORMAL HIGH (ref 15–150)

## 2018-11-20 ENCOUNTER — Emergency Department (HOSPITAL_COMMUNITY): Payer: Medicare Other

## 2018-11-20 ENCOUNTER — Encounter (HOSPITAL_COMMUNITY): Payer: Self-pay

## 2018-11-20 ENCOUNTER — Emergency Department (HOSPITAL_COMMUNITY)
Admission: EM | Admit: 2018-11-20 | Discharge: 2018-11-20 | Disposition: A | Discharge status: Home / Self Care | Payer: Medicare Other | Attending: Emergency Medicine | Admitting: Emergency Medicine

## 2018-11-20 ENCOUNTER — Other Ambulatory Visit: Payer: Self-pay

## 2018-11-20 DIAGNOSIS — D638 Anemia in other chronic diseases classified elsewhere: Secondary | ICD-10-CM | POA: Diagnosis not present

## 2018-11-20 DIAGNOSIS — D57219 Sickle-cell/Hb-C disease with crisis, unspecified: Secondary | ICD-10-CM | POA: Diagnosis not present

## 2018-11-20 DIAGNOSIS — Z91018 Allergy to other foods: Secondary | ICD-10-CM | POA: Diagnosis not present

## 2018-11-20 DIAGNOSIS — Z79899 Other long term (current) drug therapy: Secondary | ICD-10-CM | POA: Insufficient documentation

## 2018-11-20 DIAGNOSIS — M549 Dorsalgia, unspecified: Secondary | ICD-10-CM | POA: Diagnosis not present

## 2018-11-20 DIAGNOSIS — F17210 Nicotine dependence, cigarettes, uncomplicated: Secondary | ICD-10-CM | POA: Insufficient documentation

## 2018-11-20 DIAGNOSIS — Z86718 Personal history of other venous thrombosis and embolism: Secondary | ICD-10-CM | POA: Insufficient documentation

## 2018-11-20 DIAGNOSIS — D5700 Hb-SS disease with crisis, unspecified: Secondary | ICD-10-CM | POA: Diagnosis not present

## 2018-11-20 DIAGNOSIS — Z91040 Latex allergy status: Secondary | ICD-10-CM | POA: Diagnosis not present

## 2018-11-20 LAB — CBC WITH DIFFERENTIAL/PLATELET
Abs Immature Granulocytes: 0.08 10*3/uL — ABNORMAL HIGH (ref 0.00–0.07)
Basophils Absolute: 0.1 10*3/uL (ref 0.0–0.1)
Basophils Relative: 1 %
Eosinophils Absolute: 0.3 10*3/uL (ref 0.0–0.5)
Eosinophils Relative: 2 %
HCT: 27.1 % — ABNORMAL LOW (ref 36.0–46.0)
Hemoglobin: 9.2 g/dL — ABNORMAL LOW (ref 12.0–15.0)
Immature Granulocytes: 1 %
Lymphocytes Relative: 22 %
Lymphs Abs: 3.2 10*3/uL (ref 0.7–4.0)
MCH: 33.8 pg (ref 26.0–34.0)
MCHC: 33.9 g/dL (ref 30.0–36.0)
MCV: 99.6 fL (ref 80.0–100.0)
Monocytes Absolute: 1.4 10*3/uL — ABNORMAL HIGH (ref 0.1–1.0)
Monocytes Relative: 10 %
Neutro Abs: 9.3 10*3/uL — ABNORMAL HIGH (ref 1.7–7.7)
Neutrophils Relative %: 64 %
Platelets: 503 10*3/uL — ABNORMAL HIGH (ref 150–400)
RBC: 2.72 MIL/uL — ABNORMAL LOW (ref 3.87–5.11)
RDW: 17.8 % — ABNORMAL HIGH (ref 11.5–15.5)
WBC: 14.5 10*3/uL — ABNORMAL HIGH (ref 4.0–10.5)
nRBC: 1.6 % — ABNORMAL HIGH (ref 0.0–0.2)

## 2018-11-20 LAB — RETICULOCYTES
Immature Retic Fract: 48.8 % — ABNORMAL HIGH (ref 2.3–15.9)
RBC.: 2.72 MIL/uL — ABNORMAL LOW (ref 3.87–5.11)
Retic Count, Absolute: 350.9 10*3/uL — ABNORMAL HIGH (ref 19.0–186.0)
Retic Ct Pct: 12.9 % — ABNORMAL HIGH (ref 0.4–3.1)

## 2018-11-20 LAB — COMPREHENSIVE METABOLIC PANEL
ALT: 16 U/L (ref 0–44)
AST: 16 U/L (ref 15–41)
Albumin: 4.3 g/dL (ref 3.5–5.0)
Alkaline Phosphatase: 59 U/L (ref 38–126)
Anion gap: 8 (ref 5–15)
BUN: 12 mg/dL (ref 6–20)
CO2: 24 mmol/L (ref 22–32)
Calcium: 8.7 mg/dL — ABNORMAL LOW (ref 8.9–10.3)
Chloride: 106 mmol/L (ref 98–111)
Creatinine, Ser: 0.71 mg/dL (ref 0.44–1.00)
GFR calc Af Amer: >60 mL/min (ref >60)
GFR calc non Af Amer: >60 mL/min (ref >60)
Glucose, Bld: 88 mg/dL (ref 70–99)
Potassium: 4.1 mmol/L (ref 3.5–5.1)
Sodium: 138 mmol/L (ref 135–145)
Total Bilirubin: 1.8 mg/dL — ABNORMAL HIGH (ref 0.3–1.2)
Total Protein: 7.8 g/dL (ref 6.5–8.1)

## 2018-11-20 LAB — I-STAT BETA HCG BLOOD, ED (MC, WL, AP ONLY): I-stat hCG, quantitative: <5 m[IU]/mL (ref <5)

## 2018-11-20 MED ORDER — HYDROMORPHONE HCL 1 MG/ML IJ SOLN: 1.0000 mg | INTRAMUSCULAR | Status: DC

## 2018-11-20 MED ORDER — KETOROLAC TROMETHAMINE 15 MG/ML IJ SOLN
15.0000 mg | INTRAMUSCULAR | Status: AC
  Administered 2018-11-20: 15 mg via INTRAVENOUS
  Filled 2018-11-20: qty 1

## 2018-11-20 MED ORDER — HYDROMORPHONE HCL 1 MG/ML IJ SOLN
1.0000 mg | INTRAMUSCULAR | Status: AC
  Administered 2018-11-20: 21:00:00 1 mg via INTRAVENOUS
  Filled 2018-11-20: qty 1

## 2018-11-20 MED ORDER — DEXTROSE-NACL 5-0.45 % IV SOLN
INTRAVENOUS | Status: DC
  Administered 2018-11-20: 21:00:00 via INTRAVENOUS

## 2018-11-20 MED ORDER — HYDROMORPHONE HCL 1 MG/ML IJ SOLN: 1.0000 mg | INTRAMUSCULAR | Status: AC

## 2018-11-20 MED ORDER — HYDROMORPHONE HCL 1 MG/ML IJ SOLN
1.0000 mg | INTRAMUSCULAR | Status: AC
  Administered 2018-11-20: 1 mg via INTRAVENOUS
  Filled 2018-11-20: qty 1

## 2018-11-20 MED ORDER — DIPHENHYDRAMINE HCL 50 MG/ML IJ SOLN
12.5000 mg | Freq: Once | INTRAMUSCULAR | Status: AC
  Administered 2018-11-20: 12.5 mg via INTRAVENOUS
  Filled 2018-11-20: qty 1

## 2018-11-20 MED ORDER — SODIUM CHLORIDE 0.9% FLUSH
3.0000 mL | Freq: Once | INTRAVENOUS | Status: AC
  Administered 2018-11-20: 3 mL via INTRAVENOUS

## 2018-11-20 MED ORDER — DIPHENHYDRAMINE HCL 50 MG/ML IJ SOLN
25.0000 mg | Freq: Once | INTRAMUSCULAR | Status: AC
  Administered 2018-11-20: 25 mg via INTRAVENOUS
  Filled 2018-11-20: qty 1

## 2018-11-20 MED ORDER — ONDANSETRON HCL 4 MG/2ML IJ SOLN
4.0000 mg | INTRAMUSCULAR | Status: DC | PRN
  Administered 2018-11-20: 4 mg via INTRAVENOUS
  Filled 2018-11-20: qty 2

## 2018-11-20 MED ORDER — HEPARIN SOD (PORK) LOCK FLUSH 100 UNIT/ML IV SOLN
500.0000 [IU] | Freq: Once | INTRAVENOUS | Status: AC
  Administered 2018-11-20: 500 [IU]
  Filled 2018-11-20: qty 5

## 2018-11-20 MED ORDER — HYDROMORPHONE HCL 1 MG/ML IJ SOLN: 0.5000 mg | INTRAMUSCULAR | Status: AC

## 2018-11-20 MED ORDER — HYDROMORPHONE HCL 1 MG/ML IJ SOLN
0.5000 mg | INTRAMUSCULAR | Status: AC
  Administered 2018-11-20: 0.5 mg via INTRAVENOUS
  Filled 2018-11-20: qty 1

## 2018-11-20 NOTE — ED Triage Notes (Signed)
Pt arrives c/o SCC. Pt states she threw up blood at work.  Pt states her pain is in her left arm and back, but she took PO meds which helped. Pt does not take opiods at home.

## 2018-11-20 NOTE — ED Provider Notes (Signed)
[facility] DEPT Provider Note   CSN: [id number] Arrival date & time: 11/20/18  1421    History   Chief Complaint Chief Complaint  Patient presents with  . Sickle Cell Pain Crisis    HPI [name] is a 28 y.o. female.     The history is provided by the patient and medical records. No language interpreter was used.  Sickle Cell Pain Crisis  [name] is a 28 y.o. female who presents to the Emergency Department complaining of sickle cell pain crisis. She presents to the emergency department complaining of sickle cell pain crisis that began yesterday. She has a sharp and aching pain across her upper back. She took Tylenol 650 as well as ibuprofen 800 for her pain with no significant improvement. 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 After lunch she had two episodes of emesis. There is a small amount of blood mixed in her vomit. She denies any recent illnesses or coronavirus exposures. She has no fevers, cough, shortness of breath, diarrhea, dysuria, abdominal pain. She denies any numbness or weakness. She states that she does not take opioids due to her history of addiction. Past Medical History:  Diagnosis Date  . Blood transfusion    "lots"  . Chronic back pain    "very severe; have knot in my back; from tight muscle; take RX and exercise for it"  . Depression 01/06/2011  . Exertional dyspnea    "sometimes"  . Genital HSV   . GERD (gastroesophageal reflux disease) 02/17/2011  . Migraines 11/08/11   "@ least twice/month"  . 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  . Mood swings 11/08/11   "I go back and forth; real bad"  . Sickle cell anemia ([name])   . Sickle cell anemia with crisis ([place])   . Thrombocytosis ([place]) 11/09/2015  . Trichotillomania    h/o    Patient Active Problem List   Diagnosis Date Noted  . Sickle cell anemia with crisis ([place]) 12/22/2017   . 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 Chest pain 09/29/2017  . Sickle cell crisis ([name]) 09/29/2017  . Leucocytosis   . Hb-SS disease with crisis ([place]) 08/20/2016  . Chronic anticoagulation   . Sickle cell anemia ([place]) 05/19/2016  . History of DVT (deep vein thrombosis) 04/17/2016  . Thrombocytosis ([place]) 11/09/2015  . Hyperbilirubinemia 11/09/2015  . Substance abuse ([place]) 09/26/2015  . Herpes simplex 07/14/2015  . Anemia of chronic disease   . Leukocytosis   . Sickle cell pain crisis ([place]) 03/29/2012  . 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 Hb-SS disease without crisis ([place]) 09-05-1991    Past Surgical History:  Procedure Laterality Date  . CHOLECYSTECTOMY  05/2010  . DILATION AND CURETTAGE OF UTERUS  02/20/11   S/P miscarriage  . IR GENERIC HISTORICAL  12/23/2015   IR FLUORO GUIDE CV LINE RIGHT 12/23/2015 Jacqulynn Cadet, MD [facility]INTER[facility]  . IR GENERIC HISTORICAL  12/23/2015   IR US GUIDE VASC ACCESS RIGHT 12/23/2015 Jacqulynn Cadet, MD [facility]     OB History    Gravida  5   Para  2   Term  2   Preterm  0   AB  3   Living  2     SAB  3   TAB  0   Ectopic  0   Multiple  0   Live Births  2        Obstetric Comments  Miscarried in October 2012 at about 7 weeks         Home Medications    Prior to Admission medications   Medication Sig Start Date End Date Taking? Authorizing Provider  acetaminophen (TYLENOL) 650 MG CR tablet Take 650 mg by mouth every 8 (eight) hours as needed for pain.   Yes [provider]  folic acid (FOLVITE) 1 MG tablet Take 1 mg by mouth daily.   Yes [provider]  gabapentin (NEURONTIN) 300 MG capsule Take 1 capsule (300 mg total) by mouth 3 (three) times daily. 11/06/18  Yes Dorena Dew, FNP  hydroxyurea (HYDREA) 500 MG capsule Take 500 mg by mouth 2 (two) times a day.   Yes [provider]  ibuprofen (ADVIL) 800 MG tablet Take 1 tablet (800 mg total) by mouth  every 8 (eight) hours as needed for moderate pain. 09/17/18  Yes Long, Wonda Olds, MD  ondansetron (ZOFRAN) 4 MG tablet Take 1 tablet (4 mg total) by mouth daily as needed for nausea or vomiting. 10/25/18 10/25/19  Dorena Dew, FNP    Family History Family History  Problem Relation Age of Onset  . Sickle cell trait Mother   . Sickle cell trait Father   . Diabetes Maternal Grandmother   . Diabetes Paternal Grandmother   . Hypertension Paternal Grandmother   . Diabetes Maternal Grandfather     Social History Social History   Tobacco Use  . Smoking status: Current Some Day Smoker    Packs/day: 0.25    Years: 1.00    Pack years: 0.25    Types: Cigarettes    Last attempt to quit: 03/25/2013    Years since quitting: 5.6  . Smokeless tobacco: Never Used  Substance Use Topics  . Alcohol use: No    Comment: denies  . Drug use: No    Types: Other-see comments    Comment: "Clean for 3 months"     Allergies   Beta-carotene, Carrot [daucus carota], and Latex   Review of Systems Review of Systems  All other systems reviewed and are negative.    Physical Exam Updated Vital Signs BP 124/72 (BP Location: Right Arm)   Pulse (!) 104   Temp 99.1 F (37.3 C) (Oral)   Resp 15   Wt 93.1 kg   LMP 10/21/2018   SpO2 96%   BMI 38.79 kg/m   Physical Exam Vitals signs and nursing note reviewed.  Constitutional:      Appearance: She is well-developed.  HENT:     Head: Normocephalic and atraumatic.  Cardiovascular:     Rate and Rhythm: Normal rate and regular rhythm.     Heart sounds: No murmur.  Pulmonary:     Effort: Pulmonary effort is normal. No respiratory distress.     Breath sounds: Normal breath sounds.     Comments: Port in the right upper chest wall. Abdominal:     Palpations: Abdomen is soft.     Tenderness: There is no abdominal tenderness. There is no guarding or rebound.  Musculoskeletal:        General: No tenderness.     Comments: 2+ DP pulses bilaterally   Skin:    General: Skin is warm and dry.  Neurological:     Mental Status: She is alert and oriented to person, place, and time.     Comments: Five out of five strength in all four extremities with  sensation to light touch intact in all four extremities  Psychiatric:        Behavior: Behavior normal.      ED Treatments / Results  Labs (all labs ordered are listed, but only abnormal results are displayed) Labs Reviewed  COMPREHENSIVE METABOLIC PANEL - Abnormal; Notable for the following components:      Result Value   Calcium 8.7 (*)    Total Bilirubin 1.8 (*)    All other components within normal limits  CBC WITH DIFFERENTIAL/PLATELET - Abnormal; Notable for the following components:   WBC 14.5 (*)    RBC 2.72 (*)    Hemoglobin 9.2 (*)    HCT 27.1 (*)    RDW 17.8 (*)    Platelets 503 (*)    nRBC 1.6 (*)    Neutro Abs 9.3 (*)    Monocytes Absolute 1.4 (*)    Abs Immature Granulocytes 0.08 (*)    All other components within normal limits  RETICULOCYTES - Abnormal; Notable for the following components:   Retic Ct Pct 12.9 (*)    RBC. 2.72 (*)    Retic Count, Absolute 350.9 (*)    Immature Retic Fract 48.8 (*)    All other components within normal limits  I-STAT BETA HCG BLOOD, ED ([facility], [facility], [facility] ONLY)    EKG None  Radiology Dg Chest Port 1 View  Result Date: 11/20/2018 CLINICAL DATA:  Back pain EXAM: PORTABLE CHEST 1 VIEW COMPARISON:  October 22, 2018 FINDINGS: There is a stable right-sided Port-A-Cath. The lungs are essentially clear. The heart size is stable. A vascular necrosis is noted of both humeral heads. There is no large pleural effusion. No acute osseous abnormality. IMPRESSION: No active disease. Electronically Signed   By: Constance Holster M.D.   On: 11/20/2018 21:32    Procedures Procedures (including critical care time)  Medications Ordered in ED Medications  dextrose 5 %-0.45 % sodium chloride infusion ( Intravenous Stopped 11/20/18 2257)  ondansetron  (ZOFRAN) injection 4 mg (4 mg Intravenous Given 11/20/18 2045)  HYDROmorphone (DILAUDID) injection 1 mg (has no administration in time range)    Or  HYDROmorphone (DILAUDID) injection 1 mg (has no administration in time range)  sodium chloride flush (NS) 0.9 % injection 3 mL (3 mLs Intravenous Given 11/20/18 2042)  ketorolac (TORADOL) 15 MG/ML injection 15 mg (15 mg Intravenous Given 11/20/18 2044)  diphenhydrAMINE (BENADRYL) injection 25 mg (25 mg Intravenous Given 11/20/18 2044)  HYDROmorphone (DILAUDID) injection 0.5 mg (0.5 mg Intravenous Given 11/20/18 2044)    Or  HYDROmorphone (DILAUDID) injection 0.5 mg ( Subcutaneous See Alternative 11/20/18 2044)  HYDROmorphone (DILAUDID) injection 1 mg (1 mg Intravenous Given 11/20/18 2121)    Or  HYDROmorphone (DILAUDID) injection 1 mg ( Subcutaneous See Alternative 11/20/18 2121)  HYDROmorphone (DILAUDID) injection 1 mg (1 mg Intravenous Given 11/20/18 2207)    Or  HYDROmorphone (DILAUDID) injection 1 mg ( Subcutaneous See Alternative 11/20/18 2207)  diphenhydrAMINE (BENADRYL) injection 12.5 mg (12.5 mg Intravenous Given 11/20/18 2239)  heparin lock flush 100 unit/mL (500 Units Intracatheter Given 11/20/18 2257)     Initial Impression / Assessment and Plan / ED Course  I have reviewed the triage vital signs and the nursing notes.  Pertinent labs & imaging results that were available during my care of the patient were reviewed by me and considered in my medical decision making (see chart for details).        Patient with history of sickle cell anemia here for evaluation  of thoracic pain is similar to her prior pain crises. She is non-toxic appearing on evaluation. Presentation is not consistent with PE, acute chest, pneumonia. Labs are reassuring and similar compared to her baseline. She is feeling improved following treatment in the department. Plan to discharge home with close outpatient follow-up as well as return precautions. Final Clinical Impressions(s) / ED  Diagnoses   Final diagnoses:  Sickle cell pain crisis [facility])    ED Discharge Orders    None       Quintella Reichert, MD 11/20/18 2337

## 2018-11-30 ENCOUNTER — Other Ambulatory Visit: Payer: Self-pay | Admitting: *Deleted

## 2018-11-30 NOTE — Patient Outreach (Addendum)
Noted member on Patient Ping high ED utilization list. [name] is eligible for [facility] Care Management services.  Telephone call made to [name], who is well-known to Probation officer from past [facility] Management conversations. Patient identifiers confirmed.   [name] denies having any [facility] Care Management needs at this time. States she is going to school now and comes home. Denies having any concerns with medication, transportation or any chronic disease management needs.  She reports she is looking into low income housing. 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 Reports she is staying safe during the current pandemic.   Expressed appreciation of the call. No identifiable [facility] Care Management needs at this time.    Marthenia Rolling, MSN-Ed, RN,BSN [place] Acute Care Coordinator [phone] [facility]) [phone]  (Toll free office)

## 2018-12-05 DIAGNOSIS — M87051 Idiopathic aseptic necrosis of right femur: Secondary | ICD-10-CM | POA: Diagnosis not present

## 2018-12-05 DIAGNOSIS — Z79899 Other long term (current) drug therapy: Secondary | ICD-10-CM | POA: Diagnosis not present

## 2018-12-05 DIAGNOSIS — F1111 Opioid abuse, in remission: Secondary | ICD-10-CM | POA: Diagnosis not present

## 2018-12-05 DIAGNOSIS — Z5181 Encounter for therapeutic drug level monitoring: Secondary | ICD-10-CM | POA: Diagnosis not present

## 2018-12-05 DIAGNOSIS — M87011 Idiopathic aseptic necrosis of right shoulder: Secondary | ICD-10-CM | POA: Diagnosis not present

## 2018-12-05 DIAGNOSIS — D571 Sickle-cell disease without crisis: Secondary | ICD-10-CM | POA: Diagnosis not present

## 2018-12-05 DIAGNOSIS — M87012 Idiopathic aseptic necrosis of left shoulder: Secondary | ICD-10-CM | POA: Diagnosis not present

## 2018-12-05 DIAGNOSIS — Z86718 Personal history of other venous thrombosis and embolism: Secondary | ICD-10-CM | POA: Diagnosis not present

## 2018-12-05 DIAGNOSIS — M87052 Idiopathic aseptic necrosis of left femur: Secondary | ICD-10-CM | POA: Diagnosis not present

## 2018-12-10 ENCOUNTER — Inpatient Hospital Stay (HOSPITAL_COMMUNITY)
Admission: EM | Admit: 2018-12-10 | Discharge: 2018-12-17 | DRG: 812 | Disposition: A | Discharge status: Home / Self Care | Payer: Medicare Other | Attending: Internal Medicine | Admitting: Internal Medicine

## 2018-12-10 ENCOUNTER — Inpatient Hospital Stay (HOSPITAL_COMMUNITY): Payer: Medicare Other

## 2018-12-10 ENCOUNTER — Other Ambulatory Visit: Payer: Self-pay

## 2018-12-10 ENCOUNTER — Encounter (HOSPITAL_COMMUNITY): Payer: Self-pay

## 2018-12-10 ENCOUNTER — Emergency Department (HOSPITAL_COMMUNITY): Payer: Medicare Other

## 2018-12-10 DIAGNOSIS — Z8249 Family history of ischemic heart disease and other diseases of the circulatory system: Secondary | ICD-10-CM | POA: Diagnosis not present

## 2018-12-10 DIAGNOSIS — Z888 Allergy status to other drugs, medicaments and biological substances status: Secondary | ICD-10-CM | POA: Diagnosis not present

## 2018-12-10 DIAGNOSIS — Z20828 Contact with and (suspected) exposure to other viral communicable diseases: Secondary | ICD-10-CM | POA: Diagnosis present

## 2018-12-10 DIAGNOSIS — Z91040 Latex allergy status: Secondary | ICD-10-CM | POA: Diagnosis not present

## 2018-12-10 DIAGNOSIS — G894 Chronic pain syndrome: Secondary | ICD-10-CM | POA: Diagnosis present

## 2018-12-10 DIAGNOSIS — F329 Major depressive disorder, single episode, unspecified: Secondary | ICD-10-CM | POA: Diagnosis present

## 2018-12-10 DIAGNOSIS — D72829 Elevated white blood cell count, unspecified: Secondary | ICD-10-CM | POA: Diagnosis not present

## 2018-12-10 DIAGNOSIS — Z91018 Allergy to other foods: Secondary | ICD-10-CM

## 2018-12-10 DIAGNOSIS — M545 Low back pain, unspecified: Secondary | ICD-10-CM

## 2018-12-10 DIAGNOSIS — D638 Anemia in other chronic diseases classified elsewhere: Secondary | ICD-10-CM | POA: Diagnosis not present

## 2018-12-10 DIAGNOSIS — E876 Hypokalemia: Secondary | ICD-10-CM | POA: Diagnosis not present

## 2018-12-10 DIAGNOSIS — F17210 Nicotine dependence, cigarettes, uncomplicated: Secondary | ICD-10-CM | POA: Diagnosis present

## 2018-12-10 DIAGNOSIS — Z832 Family history of diseases of the blood and blood-forming organs and certain disorders involving the immune mechanism: Secondary | ICD-10-CM

## 2018-12-10 DIAGNOSIS — R6510 Systemic inflammatory response syndrome (SIRS) of non-infectious origin without acute organ dysfunction: Secondary | ICD-10-CM

## 2018-12-10 DIAGNOSIS — R079 Chest pain, unspecified: Secondary | ICD-10-CM | POA: Diagnosis not present

## 2018-12-10 DIAGNOSIS — D57219 Sickle-cell/Hb-C disease with crisis, unspecified: Secondary | ICD-10-CM | POA: Diagnosis not present

## 2018-12-10 DIAGNOSIS — G43909 Migraine, unspecified, not intractable, without status migrainosus: Secondary | ICD-10-CM | POA: Diagnosis present

## 2018-12-10 DIAGNOSIS — Z833 Family history of diabetes mellitus: Secondary | ICD-10-CM

## 2018-12-10 DIAGNOSIS — R0789 Other chest pain: Secondary | ICD-10-CM | POA: Diagnosis not present

## 2018-12-10 DIAGNOSIS — R509 Fever, unspecified: Secondary | ICD-10-CM | POA: Diagnosis not present

## 2018-12-10 DIAGNOSIS — R52 Pain, unspecified: Secondary | ICD-10-CM | POA: Diagnosis not present

## 2018-12-10 DIAGNOSIS — D571 Sickle-cell disease without crisis: Secondary | ICD-10-CM | POA: Diagnosis not present

## 2018-12-10 DIAGNOSIS — R0602 Shortness of breath: Secondary | ICD-10-CM | POA: Diagnosis not present

## 2018-12-10 DIAGNOSIS — D5700 Hb-SS disease with crisis, unspecified: Principal | ICD-10-CM | POA: Diagnosis present

## 2018-12-10 DIAGNOSIS — K219 Gastro-esophageal reflux disease without esophagitis: Secondary | ICD-10-CM | POA: Diagnosis present

## 2018-12-10 LAB — URINALYSIS, ROUTINE W REFLEX MICROSCOPIC
Bacteria, UA: NONE SEEN
Bilirubin Urine: NEGATIVE
Glucose, UA: NEGATIVE mg/dL
Ketones, ur: NEGATIVE mg/dL
Leukocytes,Ua: NEGATIVE
Nitrite: NEGATIVE
Protein, ur: NEGATIVE mg/dL
Specific Gravity, Urine: 1.012 (ref 1.005–1.030)
pH: 5 (ref 5.0–8.0)

## 2018-12-10 LAB — COMPREHENSIVE METABOLIC PANEL
ALT: 18 U/L (ref 0–44)
AST: 16 U/L (ref 15–41)
Albumin: 3.7 g/dL (ref 3.5–5.0)
Alkaline Phosphatase: 52 U/L (ref 38–126)
Anion gap: 6 (ref 5–15)
BUN: 9 mg/dL (ref 6–20)
CO2: 24 mmol/L (ref 22–32)
Calcium: 8.1 mg/dL — ABNORMAL LOW (ref 8.9–10.3)
Chloride: 110 mmol/L (ref 98–111)
Creatinine, Ser: 0.73 mg/dL (ref 0.44–1.00)
GFR calc Af Amer: >60 mL/min (ref >60)
GFR calc non Af Amer: >60 mL/min (ref >60)
Glucose, Bld: 82 mg/dL (ref 70–99)
Potassium: 3.5 mmol/L (ref 3.5–5.1)
Sodium: 140 mmol/L (ref 135–145)
Total Bilirubin: 2 mg/dL — ABNORMAL HIGH (ref 0.3–1.2)
Total Protein: 6.7 g/dL (ref 6.5–8.1)

## 2018-12-10 LAB — CBC WITH DIFFERENTIAL/PLATELET
Abs Immature Granulocytes: 0.18 10*3/uL — ABNORMAL HIGH (ref 0.00–0.07)
Basophils Absolute: 0.1 10*3/uL (ref 0.0–0.1)
Basophils Relative: 1 %
Eosinophils Absolute: 0.3 10*3/uL (ref 0.0–0.5)
Eosinophils Relative: 3 %
HCT: 22.4 % — ABNORMAL LOW (ref 36.0–46.0)
Hemoglobin: 7.6 g/dL — ABNORMAL LOW (ref 12.0–15.0)
Immature Granulocytes: 2 %
Lymphocytes Relative: 18 %
Lymphs Abs: 2 10*3/uL (ref 0.7–4.0)
MCH: 32.8 pg (ref 26.0–34.0)
MCHC: 33.9 g/dL (ref 30.0–36.0)
MCV: 96.6 fL (ref 80.0–100.0)
Monocytes Absolute: 1.1 10*3/uL — ABNORMAL HIGH (ref 0.1–1.0)
Monocytes Relative: 10 %
Neutro Abs: 7.4 10*3/uL (ref 1.7–7.7)
Neutrophils Relative %: 66 %
Platelets: 456 10*3/uL — ABNORMAL HIGH (ref 150–400)
RBC: 2.32 MIL/uL — ABNORMAL LOW (ref 3.87–5.11)
RDW: 17.2 % — ABNORMAL HIGH (ref 11.5–15.5)
WBC: 11 10*3/uL — ABNORMAL HIGH (ref 4.0–10.5)
nRBC: 0.9 % — ABNORMAL HIGH (ref 0.0–0.2)

## 2018-12-10 LAB — SARS CORONAVIRUS 2 BY RT PCR (HOSPITAL ORDER, PERFORMED IN ~~LOC~~ HOSPITAL LAB): SARS Coronavirus 2: NEGATIVE

## 2018-12-10 LAB — TROPONIN I (HIGH SENSITIVITY)
Troponin I (High Sensitivity): <2 ng/L (ref <18)
Troponin I (High Sensitivity): <2 ng/L (ref <18)

## 2018-12-10 LAB — RETICULOCYTES
Immature Retic Fract: 30.9 % — ABNORMAL HIGH (ref 2.3–15.9)
RBC.: 2.32 MIL/uL — ABNORMAL LOW (ref 3.87–5.11)
Retic Count, Absolute: 319.2 10*3/uL — ABNORMAL HIGH (ref 19.0–186.0)
Retic Ct Pct: 13.8 % — ABNORMAL HIGH (ref 0.4–3.1)

## 2018-12-10 LAB — I-STAT BETA HCG BLOOD, ED (MC, WL, AP ONLY): I-stat hCG, quantitative: <5 m[IU]/mL (ref <5)

## 2018-12-10 MED ORDER — DIPHENHYDRAMINE HCL 50 MG/ML IJ SOLN
12.5000 mg | Freq: Four times a day (QID) | INTRAMUSCULAR | Status: DC | PRN
  Administered 2018-12-11: 12.5 mg via INTRAVENOUS
  Filled 2018-12-10: qty 1

## 2018-12-10 MED ORDER — HYDROMORPHONE HCL 1 MG/ML IJ SOLN
1.0000 mg | INTRAMUSCULAR | Status: AC
  Filled 2018-12-10: qty 1

## 2018-12-10 MED ORDER — HYDROMORPHONE HCL 1 MG/ML IJ SOLN
1.0000 mg | INTRAMUSCULAR | Status: AC
  Administered 2018-12-10 (×2): 1 mg via INTRAVENOUS
  Filled 2018-12-10 (×2): qty 1

## 2018-12-10 MED ORDER — HYDROXYUREA 500 MG PO CAPS
500.0000 mg | ORAL_CAPSULE | Freq: Two times a day (BID) | ORAL | Status: DC
  Administered 2018-12-10 – 2018-12-14 (×8): 500 mg via ORAL
  Filled 2018-12-10 (×10): qty 1

## 2018-12-10 MED ORDER — NALOXONE HCL 0.4 MG/ML IJ SOLN: 0.4000 mg | INTRAMUSCULAR | Status: DC | PRN

## 2018-12-10 MED ORDER — HYDROMORPHONE HCL 1 MG/ML IJ SOLN: 1.0000 mg | INTRAMUSCULAR | Status: AC

## 2018-12-10 MED ORDER — POLYETHYLENE GLYCOL 3350 17 G PO PACK
17.0000 g | PACK | Freq: Every day | ORAL | Status: DC | PRN
  Administered 2018-12-14: 17 g via ORAL
  Filled 2018-12-10: qty 1

## 2018-12-10 MED ORDER — ONDANSETRON HCL 4 MG/2ML IJ SOLN
4.0000 mg | Freq: Once | INTRAMUSCULAR | Status: AC
  Administered 2018-12-10: 13:00:00 4 mg via INTRAVENOUS
  Filled 2018-12-10: qty 2

## 2018-12-10 MED ORDER — DIPHENHYDRAMINE HCL 12.5 MG/5ML PO ELIX: 12.5000 mg | ORAL_SOLUTION | Freq: Four times a day (QID) | ORAL | Status: DC | PRN

## 2018-12-10 MED ORDER — IOHEXOL 350 MG/ML SOLN
100.0000 mL | Freq: Once | INTRAVENOUS | Status: AC | PRN
  Administered 2018-12-10: 100 mL via INTRAVENOUS

## 2018-12-10 MED ORDER — KETOROLAC TROMETHAMINE 15 MG/ML IJ SOLN
15.0000 mg | Freq: Four times a day (QID) | INTRAMUSCULAR | Status: AC
  Administered 2018-12-10 – 2018-12-15 (×19): 15 mg via INTRAVENOUS
  Filled 2018-12-10 (×21): qty 1

## 2018-12-10 MED ORDER — HYDROMORPHONE HCL 1 MG/ML IJ SOLN
1.0000 mg | INTRAMUSCULAR | Status: AC
  Administered 2018-12-10: 1 mg via INTRAVENOUS
  Filled 2018-12-10: qty 1

## 2018-12-10 MED ORDER — HYDROMORPHONE 1 MG/ML IV SOLN
INTRAVENOUS | Status: DC
  Administered 2018-12-10: 30 mg via INTRAVENOUS
  Administered 2018-12-10: 2.3 mg via INTRAVENOUS
  Administered 2018-12-10: 2.7 mg via INTRAVENOUS
  Administered 2018-12-11: 3.5 mg via INTRAVENOUS
  Administered 2018-12-11: 1.8 mg via INTRAVENOUS
  Administered 2018-12-11: 3.3 mg via INTRAVENOUS
  Administered 2018-12-11: 2.1 mg via INTRAVENOUS
  Administered 2018-12-12: 2.6 mg via INTRAVENOUS
  Administered 2018-12-12: 3 mg via INTRAVENOUS
  Administered 2018-12-12: 2.1 mg via INTRAVENOUS
  Administered 2018-12-12: 1.8 mg via INTRAVENOUS
  Filled 2018-12-10 (×2): qty 30

## 2018-12-10 MED ORDER — DIPHENHYDRAMINE HCL 50 MG/ML IJ SOLN
12.5000 mg | Freq: Once | INTRAMUSCULAR | Status: AC
  Administered 2018-12-10: 12.5 mg via INTRAVENOUS

## 2018-12-10 MED ORDER — ONDANSETRON HCL 4 MG/2ML IJ SOLN
4.0000 mg | INTRAMUSCULAR | Status: DC | PRN
  Administered 2018-12-10: 4 mg via INTRAVENOUS
  Filled 2018-12-10: qty 2

## 2018-12-10 MED ORDER — ONDANSETRON HCL 4 MG/2ML IJ SOLN: 4.0000 mg | Freq: Four times a day (QID) | INTRAMUSCULAR | Status: DC | PRN

## 2018-12-10 MED ORDER — HYDROMORPHONE HCL 1 MG/ML IJ SOLN: 0.5000 mg | INTRAMUSCULAR | Status: AC

## 2018-12-10 MED ORDER — HYDROMORPHONE HCL 1 MG/ML IJ SOLN
1.0000 mg | INTRAMUSCULAR | Status: AC
  Administered 2018-12-10: 1 mg via INTRAVENOUS

## 2018-12-10 MED ORDER — HYDROMORPHONE HCL 1 MG/ML IJ SOLN
0.5000 mg | INTRAMUSCULAR | Status: AC
  Administered 2018-12-10: 0.5 mg via INTRAVENOUS

## 2018-12-10 MED ORDER — DEXTROSE-NACL 5-0.45 % IV SOLN
INTRAVENOUS | Status: DC
  Administered 2018-12-10 – 2018-12-16 (×5): via INTRAVENOUS

## 2018-12-10 MED ORDER — KETOROLAC TROMETHAMINE 30 MG/ML IJ SOLN
30.0000 mg | INTRAMUSCULAR | Status: AC
  Administered 2018-12-10: 30 mg via INTRAVENOUS
  Filled 2018-12-10: qty 1

## 2018-12-10 MED ORDER — ACETAMINOPHEN 325 MG PO TABS
650.0000 mg | ORAL_TABLET | Freq: Four times a day (QID) | ORAL | Status: DC | PRN
  Administered 2018-12-12 – 2018-12-15 (×3): 650 mg via ORAL
  Filled 2018-12-10 (×4): qty 2

## 2018-12-10 MED ORDER — SODIUM CHLORIDE (PF) 0.9 % IJ SOLN
INTRAMUSCULAR | Status: AC
  Filled 2018-12-10: qty 50

## 2018-12-10 MED ORDER — DIPHENHYDRAMINE HCL 50 MG/ML IJ SOLN
25.0000 mg | Freq: Once | INTRAMUSCULAR | Status: AC
  Administered 2018-12-10: 25 mg via INTRAVENOUS
  Filled 2018-12-10: qty 1

## 2018-12-10 MED ORDER — ENOXAPARIN SODIUM 40 MG/0.4ML ~~LOC~~ SOLN
40.0000 mg | SUBCUTANEOUS | Status: DC
  Administered 2018-12-14: 40 mg via SUBCUTANEOUS
  Filled 2018-12-10 (×4): qty 0.4

## 2018-12-10 MED ORDER — SENNOSIDES-DOCUSATE SODIUM 8.6-50 MG PO TABS
1.0000 | ORAL_TABLET | Freq: Two times a day (BID) | ORAL | Status: DC
  Administered 2018-12-10 – 2018-12-17 (×13): 1 via ORAL
  Filled 2018-12-10 (×16): qty 1

## 2018-12-10 MED ORDER — IOHEXOL 300 MG/ML  SOLN: 100.0000 mL | Freq: Once | INTRAMUSCULAR | Status: DC | PRN

## 2018-12-10 MED ORDER — GABAPENTIN 300 MG PO CAPS
300.0000 mg | ORAL_CAPSULE | Freq: Three times a day (TID) | ORAL | Status: DC
  Administered 2018-12-10 – 2018-12-17 (×19): 300 mg via ORAL
  Filled 2018-12-10 (×21): qty 1

## 2018-12-10 MED ORDER — FOLIC ACID 1 MG PO TABS
1.0000 mg | ORAL_TABLET | Freq: Every day | ORAL | Status: DC
  Administered 2018-12-10 – 2018-12-17 (×8): 1 mg via ORAL
  Filled 2018-12-10 (×8): qty 1

## 2018-12-10 MED ORDER — SODIUM CHLORIDE 0.9% FLUSH: 9.0000 mL | INTRAVENOUS | Status: DC | PRN

## 2018-12-10 NOTE — ED Notes (Signed)
ED TO INPATIENT HANDOFF REPORT  Name/Age/Gender [name] 28 y.o. female  Code Status Code Status History    Date Active Date Inactive Code Status Order ID Comments User Context   10/22/2018 2326 10/25/2018 1835 Full Code [id number]  Rise Patience, MD ED   09/18/2018 1051 09/20/2018 2039 Full Code [id number]  Dorena Dew, [place] Inpatient   12/23/2017 0541 12/23/2017 1833 Full Code [id number]  Etta Quill, DO ED   12/22/2017 0108 12/22/2017 2124 Full Code [id number]  Karmen Bongo, MD ED   11/19/2017 2053 11/23/2017 1241 Full Code [id number]  Elwyn Reach, MD Inpatient   10/06/2017 0002 10/09/2017 1803 Full Code [id number]  Rise Patience, MD ED   09/30/2017 0122 10/03/2017 2319 Full Code [id number]  Toy Baker, MD Inpatient   07/10/2017 1947 07/13/2017 1916 Full Code [id number]  Elwyn Reach, MD ED   07/02/2017 0848 07/07/2017 2044 Full Code [id number]  Elwyn Reach, MD Inpatient   05/27/2017 2139 06/01/2017 1937 Full Code [id number]  Etta Quill, DO ED   05/04/2017 2105 05/07/2017 2127 Full Code [id number]  Rise Patience, MD ED   04/01/2017 1813 04/03/2017 1841 Full Code [id number]  Aline August, MD ED   02/28/2017 0045 03/05/2017 2133 Full Code [id number]  Etta Quill, DO ED   02/02/2017 0309 02/04/2017 2250 Full Code [id number]  Bethena Roys, MD Inpatient   12/10/2016 0403 12/14/2016 1705 Full Code [id number]  Etta Quill, DO ED   11/06/2016 2039 11/11/2016 1659 Full Code [id number]  Patrecia Pour, MD Inpatient   10/06/2016 1647 10/09/2016 1809 Full Code [id number]  [name], MD ED   10/02/2016 1512 10/05/2016 1042 Full Code [id number]  Tresa Garter, MD Inpatient   09/15/2016 1723 09/19/2016 2025 Full Code [id number]  Dorena Dew, [place] Inpatient   08/20/2016 1812 08/30/2016 1613 Full Code [id number]  [name], MD Inpatient   07/25/2016 2320 07/29/2016 2126 Full Code [id number]  Norval Morton, MD ED   06/26/2016 2154  07/02/2016 1954 Full Code [id number]  Etta Quill, DO ED   06/10/2016 0511 06/15/2016 1511 Full Code [id number]  Etta Quill, DO ED   05/29/2016 2007 05/31/2016 1803 Full Code [id number]  Cristal Ford, DO Inpatient   05/19/2016 1914 05/25/2016 2005 Full Code [id number]  Charlynne Cousins, MD Inpatient   04/24/2016 1059 05/02/2016 1750 Full Code [id number]  Elwyn Reach, MD Inpatient   04/17/2016 2105 04/20/2016 1913 Full Code [id number]  Reubin Milan, MD Inpatient   04/17/2016 2105 04/17/2016 2105 Full Code [id number]  Reubin Milan, MD Inpatient   03/31/2016 1638 04/07/2016 1808 Full Code [id number]  [name], MD Inpatient   02/26/2016 1859 03/03/2016 2104 Full Code [id number]  [name], MD Inpatient   02/16/2016 1033 02/17/2016 1101 Full Code [id number]  Tresa Garter, MD Inpatient   01/30/2016 0751 02/05/2016 1904 Full Code [id number]  Ivor Costa, MD ED   01/05/2016 0837 01/09/2016 1727 Full Code [id number]  Tresa Garter, MD Inpatient   12/27/2015 0358 12/29/2015 2009 Full Code [id number]  Ivor Costa, MD ED   11/09/2015 0118 11/12/2015 1837 Full Code [id number]  Norval Morton, MD ED   10/31/2015 1854 11/02/2015 1456 Full Code [id number]  Manya Silvas, [place] Inpatient   10/30/2015 1446 10/31/2015 1854 Full Code [id number]  Lavonia Drafts, MD Inpatient   10/26/2015 2125 10/30/2015 1446 Full Code [id number]  Gwynne Edinger, MD Inpatient   10/15/2015 1857  10/18/2015 2027 Full Code [id number]  [name], MD Inpatient   10/06/2015 2258 10/10/2015 1623 Full Code [id number]  Lily Kocher, MD ED   09/22/2015 2026 09/27/2015 1735 Full Code [id number]  Osborne Oman, MD Inpatient   09/07/2015 1431 09/14/2015 1454 Full Code [id number]  [name], MD Inpatient   08/10/2015 1405 08/17/2015 1638 Full Code [id number]  [name], MD Inpatient   07/24/2015 0531 08/02/2015 1817 Full Code [id number]  Carlyle Dolly, MD Inpatient   07/24/2015  0447 07/24/2015 0531 Full Code [id number]  Carlyle Dolly, MD Inpatient   07/24/2015 0353 07/24/2015 0447 Full Code [id number]  Carlyle Dolly, MD Inpatient   07/12/2015 0214 07/14/2015 2028 Full Code [id number]  Ivor Costa, MD ED   06/28/2015 1037 07/03/2015 1441 Full Code [id number]  [name], MD Inpatient   05/31/2015 1459 06/05/2015 2049 Full Code [id number]  [name], MD ED   05/02/2015 1639 05/08/2015 1644 Full Code [id number]  [name], MD Inpatient   04/14/2015 1741 04/22/2015 2006 Full Code [id number]  Tresa Garter, MD Inpatient   04/14/2015 1741 04/14/2015 1741 Full Code [id number]  Tresa Garter, MD Inpatient   04/14/2015 1153 04/14/2015 1741 Full Code [id number]  Tresa Garter, MD Inpatient   03/30/2015 1104 04/03/2015 1742 Full Code [id number]  Tresa Garter, MD Inpatient   03/22/2015 2042 03/26/2015 1747 Full Code [id number]  Reubin Milan, MD ED   01/25/2015 1323 01/27/2015 1554 Full Code [id number]  Elwyn Reach, MD Inpatient   12/07/2014 1557 12/10/2014 1720 Full Code [id number]  Keitha Butte, CNM Inpatient   12/05/2014 0920 12/07/2014 1557 Full Code [id number]  Tami Ribas, RN Inpatient   11/26/2014 2216 12/01/2014 2033 Full Code [id number]  Virginia Crews, MD Inpatient   11/19/2014 0129 11/21/2014 1812 Full Code [id number]  Seabron Spates, CNM Inpatient   10/23/2014 2118 10/30/2014 1932 Full Code [id number]  Truett Mainland, DO Inpatient   10/11/2014 0010 10/14/2014 1707 Full Code [id number]  Larey Days, [place] Inpatient   09/18/2014 1545 09/26/2014 1656 Full Code [id number]  Lavonia Drafts, MD Inpatient   09/08/2014 0117 09/10/2014 1821 Full Code [id number]  Lavonia Drafts, MD Inpatient   09/08/2014 0117 09/08/2014 0117 Full Code [id number]  Lavonia Drafts, MD Inpatient   08/09/2014 2137 08/15/2014 2048 Full Code [id number]  Tresea Mall, CNM Inpatient   07/14/2014 0301 07/16/2014 1653 Full Code  [id number]  Brantley Persons, RN Inpatient   06/25/2014 1651 06/28/2014 1705 Full Code [id number]  [name], MD Inpatient   06/02/2014 0106 06/05/2014 1936 Full Code [id number]  Elberta Leatherwood, MD Inpatient   05/03/2014 1818 05/06/2014 2158 Full Code [id number]  Markus Daft, MD Inpatient   05/02/2014 1207 05/03/2014 1818 Full Code [id number]  [name], MD ED   04/10/2014 1641 04/18/2014 2117 Full Code [id number]  Greggory Keen, MD Inpatient   04/09/2014 1028 04/10/2014 1641 Full Code [id number]  Theodis Blaze, MD Inpatient   02/16/2014 2020 02/25/2014 1411 Full Code [id number]  Caren Griffins, MD ED   01/29/2014 0343 02/01/2014 1442 Full Code [id number]  Rise Patience, MD Inpatient   01/13/2014 1815 01/16/2014 1304 Full Code [id number]  Robbie Lis, MD Inpatient   11/25/2013 1437 11/30/2013 2009 Full Code [id number]  Verlee Monte, MD Inpatient   09/24/2013 0615 09/27/2013 1532 Full Code [id number]  Rise Patience, MD Inpatient   07/31/2013 [phone] 08/04/2013 2047 Full Code [id number]  Etta Quill, DO ED   07/16/2013 0907 07/16/2013 2310 Full Code [id number]  [name], MD Inpatient   05/28/2013 0515 06/01/2013 2117 Full Code [id number]  Berle Mull, MD ED   03/23/2013 0430 03/29/2013 1937 Full Code [id number]  Theressa Millard, MD ED   02/11/2013 0803 02/14/2013 1639 Full Code [id number]  Rise Patience, MD Inpatient   01/27/2013 1257 01/30/2013 1853 Full Code [id number]  Domenic Polite, MD Inpatient   10/05/2012 2154 10/12/2012 2107 Full Code [id number]  Theressa Millard, MD ED   08/27/2012 1955 08/31/2012 1833 Full Code [id number]  Thurnell Lose, MD Inpatient   06/22/2012 1436 06/27/2012 1457 Full Code [id number]  Jorene Guest, RN Inpatient   03/28/2012 2122 04/04/2012 1929 Full Code [id number]  Verl Dicker, PA-C ED   03/24/2012 0432 03/25/2012 1144 Full Code [id number]  Martie Lee, [place] ED   03/04/2012 0448 03/15/2012 2258 Full Code [id number]  Kennith Center,  RN Inpatient   02/13/2012 0647 02/17/2012 0124 Full Code [id number]  Jacqualyn Posey, RN Inpatient   12/17/2011 1614 12/22/2011 1716 Full Code [id number]  Theodis Blaze, MD Inpatient   12/16/2011 2205 12/17/2011 1614 Full Code [id number]  Threasa Beards, MD ED   11/30/2011 0309 12/03/2011 1745 Full Code [id number]  Evelina Dun, RN ED   11/08/2011 0723 11/14/2011 1823 Full Code [id number]  Ron Parker, RN ED   09/05/2011 0204 09/05/2011 0732 Full Code [id number]  Garald Balding, NP ED   08/05/2011 0755 08/05/2011 1852 Full Code [id number]  Rainey Pines, RN ED   08/05/2011 0028 08/05/2011 0755 Full Code [id number]  Charlena Cross, MD ED   07/13/2011 1149 07/15/2011 1847 Full Code [id number]  Julieta Bellini, NP ED   06/09/2011 0048 06/09/2011 0959 Full Code [id number]  Charlena Cross, MD ED   05/31/2011 1828 06/01/2011 1837 Full Code [id number]  Jenel Lucks, RN Inpatient   05/26/2011 0218 05/26/2011 1419 Full Code [id number]  Molpus, Karen Chafe, MD ED   05/06/2011 0700 05/08/2011 1609 Full Code [id number]  [place], [place], MD Inpatient   04/17/2011 1329 04/19/2011 1650 Full Code [id number]  Judithann Sheen, MD ED   03/30/2011 0039 04/03/2011 0025 Full Code [id number]  Hoy Morn, MD ED   Advance Care Planning Activity      Home/SNF/Other Home  Chief Complaint sickle cell pain  Level of Care/Admitting Diagnosis ED Disposition    ED Disposition Condition [facility] Area: [facility] [[id number]]  Level of Care: Med-Surg [16]  Covid Evaluation: Asymptomatic Screening Protocol (No Symptoms)  Diagnosis: Sickle cell pain crisis [facility]) [[id number]]  Admitting Physician: Tresa Garter [[id number]]  Attending Physician: Tresa Garter [[id number]]  Estimated length of stay: past midnight tomorrow  Certification:: I certify this patient will need inpatient services for at least 2 midnights  PT Class (Do Not Modify): Inpatient [101]  PT Acc Code (Do Not Modify):  Private [1]       Medical History Past Medical History:  Diagnosis Date  . Blood transfusion    "lots"  . Chronic back pain    "very severe; have knot in my back; from tight muscle; take RX and exercise for it"  . Depression 01/06/2011  . Exertional dyspnea    "sometimes"  . Genital HSV   . GERD (gastroesophageal reflux disease) 02/17/2011  . Migraines 11/08/11   "@ least twice/month"  . Miscarriage 03/22/2011  Pt reports 2 miscarriages.  . Mood swings 11/08/11   "I go back and forth; real bad"  . Sickle cell anemia ([name])   . Sickle cell anemia with crisis ([place])   . Thrombocytosis ([place]) 11/09/2015  . Trichotillomania    h/o    Allergies Allergies  Allergen Reactions  . Beta-Carotene Hives and Swelling    Had hives and swelling in mouth and neck area  . Carrot [Daucus Carota] Hives and Swelling    Allergic to carrots  . Latex Rash and Hives    IV Location/Drains/Wounds Patient Lines/Drains/Airways Status   Active Line/Drains/Airways    Name:   Placement date:   Placement time:   Site:   Days:   Implanted Port   -    -    -             Labs/Imaging Results for orders placed or performed during the hospital encounter of 12/10/18 (from the past 48 hour(s))  Comprehensive metabolic panel     Status: Abnormal   Collection Time: 12/10/18 10:52 AM  Result Value Ref Range   Sodium 140 135 - 145 mmol/L   Potassium 3.5 3.5 - 5.1 mmol/L   Chloride 110 98 - 111 mmol/L   CO2 24 22 - 32 mmol/L   Glucose, Bld 82 70 - 99 mg/dL   BUN 9 6 - 20 mg/dL   Creatinine, Ser 0.73 0.44 - 1.00 mg/dL   Calcium 8.1 (L) 8.9 - 10.3 mg/dL   Total Protein 6.7 6.5 - 8.1 g/dL   Albumin 3.7 3.5 - 5.0 g/dL   AST 16 15 - 41 U/L   ALT 18 0 - 44 U/L   Alkaline Phosphatase 52 38 - 126 U/L   Total Bilirubin 2.0 (H) 0.3 - 1.2 mg/dL   GFR calc non Af Amer >60 >60 mL/min   GFR calc Af Amer >60 >60 mL/min   Anion gap 6 5 - 15    Comment: Performed at [facility], [place]  [address] [place], [place] [address]  CBC WITH DIFFERENTIAL     Status: Abnormal   Collection Time: 12/10/18 10:52 AM  Result Value Ref Range   WBC 11.0 (H) 4.0 - 10.5 K/uL   RBC 2.32 (L) 3.87 - 5.11 MIL/uL   Hemoglobin 7.6 (L) 12.0 - 15.0 g/dL   HCT 22.4 (L) 36.0 - 46.0 %   MCV 96.6 80.0 - 100.0 fL   MCH 32.8 26.0 - 34.0 pg   MCHC 33.9 30.0 - 36.0 g/dL   RDW 17.2 (H) 11.5 - 15.5 %   Platelets 456 (H) 150 - 400 K/uL   nRBC 0.9 (H) 0.0 - 0.2 %   Neutrophils Relative % 66 %   Neutro Abs 7.4 1.7 - 7.7 K/uL   Lymphocytes Relative 18 %   Lymphs Abs 2.0 0.7 - 4.0 K/uL   Monocytes Relative 10 %   Monocytes Absolute 1.1 (H) 0.1 - 1.0 K/uL   Eosinophils Relative 3 %   Eosinophils Absolute 0.3 0.0 - 0.5 K/uL   Basophils Relative 1 %   Basophils Absolute 0.1 0.0 - 0.1 K/uL   Immature Granulocytes 2 %   Abs Immature Granulocytes 0.18 (H) 0.00 - 0.07 K/uL    Comment: Performed at [facility], [place] [address] [place], [place] [address]  Reticulocytes     Status: Abnormal   Collection Time: 12/10/18 10:52 AM  Result Value Ref Range   Retic Ct Pct 13.8 (H)  0.4 - 3.1 %   RBC. 2.32 (L) 3.87 - 5.11 MIL/uL   Retic Count, Absolute 319.2 (H) 19.0 - 186.0 K/uL   Immature Retic Fract 30.9 (H) 2.3 - 15.9 %    Comment: Performed at [facility], [place] [address], [place], [place] [address]  Troponin I (High Sensitivity)     Status: None   Collection Time: 12/10/18 10:52 AM  Result Value Ref Range   Troponin I (High Sensitivity) <2 <18 ng/L    Comment: Performed at [facility], [place] [address] [place], [place] [address]  I-Stat beta hCG blood, ED     Status: None   Collection Time: 12/10/18 11:03 AM  Result Value Ref Range   I-stat hCG, quantitative <5.0 <5 mIU/mL   Comment 3            Comment:   GEST. AGE      CONC.  (mIU/mL)   <=1 WEEK        5 - 50     2 WEEKS       50 - 500     3 WEEKS       100 - 10,000     4 WEEKS     1,000 -  30,000        FEMALE AND NON-PREGNANT FEMALE:     LESS THAN 5 mIU/mL    *Note: Due to a large number of results and/or encounters for the requested time period, some results have not been displayed. A complete set of results can be found in Results Review.   Dg Chest Port 1 View  Result Date: 12/10/2018 CLINICAL DATA:  28 year old female with sickle cell pain crisis. Mid chest pain and shortness of breath. EXAM: PORTABLE CHEST 1 VIEW COMPARISON:  Portable chest 11/20/2018 and earlier. FINDINGS: Portable AP semi upright view at 1014 hours. Stable right chest power port, no longer accessed. Stable lung volumes and mediastinal contours. Allowing for portable technique the lungs are clear. Visualized tracheal air column is within normal limits. Borderline to mild cardiomegaly. Paucity bowel gas in the upper abdomen. No acute osseous abnormality identified. IMPRESSION: No acute cardiopulmonary abnormality. Electronically Signed   By: Genevie Ann M.D.   On: 12/10/2018 10:52    Pending Labs Unresulted Labs (From admission, onward)    Start     Ordered   12/10/18 1355  SARS Coronavirus 2 (CEPHEID - Performed in [place] hospital lab), [facility]  (Asymptomatic Patients Labs)  Once,   STAT    Question:  Rule Out  Answer:  Yes   12/10/18 1354   12/10/18 1019  Urinalysis, Routine w reflex microscopic  ONCE - STAT,   STAT     12/10/18 1018   Signed and Held  CBC  (enoxaparin (LOVENOX)    CrCl >/= 30 ml/min)  Once,   R    Comments: Baseline for enoxaparin therapy IF NOT ALREADY DRAWN.  Notify MD if PLT < 100 K.    Signed and Held   Signed and Held  Creatinine, serum  (enoxaparin (LOVENOX)    CrCl >/= 30 ml/min)  Once,   R    Comments: Baseline for enoxaparin therapy IF NOT ALREADY DRAWN.    Signed and Held   Signed and Held  Creatinine, serum  (enoxaparin (LOVENOX)    CrCl >/= 30 ml/min)  Weekly,   R    Comments: while on enoxaparin therapy    Signed and Held   Signed  and Held  Comprehensive  metabolic panel  Tomorrow morning,   R     Signed and Held   Signed and Held  CBC with Differential/Platelet  Tomorrow morning,   R     Signed and Held          Vitals/Pain Today's Vitals   12/10/18 1300 12/10/18 1330 12/10/18 1430 12/10/18 1500  BP: 134/73 (!) 149/111 (!) 119/103 134/79  Pulse: 82 82 93 91  Resp: 14 16 19 18   Temp:      TempSrc:      SpO2: 100% 99% 98% 100%  Weight:      Height:      PainSc:        Isolation Precautions No active isolations  Medications Medications  ondansetron (ZOFRAN) injection 4 mg (4 mg Intravenous Given 12/10/18 1056)  dextrose 5 %-0.45 % sodium chloride infusion ( Intravenous Stopped 12/10/18 1520)  diphenhydrAMINE (BENADRYL) injection 25 mg (25 mg Intravenous Given 12/10/18 1057)  ketorolac (TORADOL) 30 MG/ML injection 30 mg (30 mg Intravenous Given 12/10/18 1055)  HYDROmorphone (DILAUDID) injection 0.5 mg (0.5 mg Intravenous Given 12/10/18 1056)    Or  HYDROmorphone (DILAUDID) injection 0.5 mg ( Subcutaneous See Alternative 12/10/18 1056)  HYDROmorphone (DILAUDID) injection 1 mg (1 mg Intravenous Given 12/10/18 1153)    Or  HYDROmorphone (DILAUDID) injection 1 mg ( Subcutaneous See Alternative 12/10/18 1153)  HYDROmorphone (DILAUDID) injection 1 mg (1 mg Intravenous Given 12/10/18 1056)    Or  HYDROmorphone (DILAUDID) injection 1 mg ( Subcutaneous See Alternative 12/10/18 1056)  HYDROmorphone (DILAUDID) injection 1 mg (1 mg Intravenous Given 12/10/18 1522)    Or  HYDROmorphone (DILAUDID) injection 1 mg ( Subcutaneous See Alternative 12/10/18 1522)  ondansetron (ZOFRAN) injection 4 mg (4 mg Intravenous Given 12/10/18 1307)  diphenhydrAMINE (BENADRYL) injection 12.5 mg (12.5 mg Intravenous Given 12/10/18 1308)    Mobility walks

## 2018-12-10 NOTE — H&P (Signed)
H&P  Patient Demographics:  [name], is a 28 y.o. female  MRN: [id number]   DOB - 11-Jul-1990  Admit Date - 12/10/2018  Outpatient Primary MD for the patient is Dorena Dew, FNP  Chief Complaint  Patient presents with  . Sickle Cell Pain Crisis      HPI:   [name]  is a 28 y.o. female with a medical history significant for sickle cell disease, type SS, chronic pain syndrome, history of DVT (completed anticoagulation), and anemia of chronic disease presents complaining of pain primarily to mid chest, upper, and lower extremities that is consistent with typical sickle cell pain crisis.  Patient attributes current pain crisis to menses.  Also, she states that she has been under a great deal of stress with taking college courses.  Patient is primarily opiate nave.  She takes Tylenol, ibuprofen, and gabapentin for pain control.  She also takes hydroxyurea and folic acid.  Patient is under the care of [facility] for sickle cell disease.  Patient has been without opiate prescription for greater than 10 months due to recent incarceration.  Patient complains of pain primarily to mid chest, characterized as intermittent, sharp, and nonradiating.  She states that pain started this morning while in shower, chest pain occurred suddenly and sharp.  She says that pain moved to upper and lower extremities during her ambulance ride. She currently denies headache, shortness of breath, persistent cough, sore throat, recent travel, sick contacts, or exposure to COVID-19.  Patient also denies fever, chills, nausea, vomiting, or diarrhea.  ER course: CT angiogram is negative for pulmonary embolism.  Chest x-ray shows no acute cardiopulmonary process.  COVID-19 test negative.  Beta hCG negative.  WBCs 11, hemoglobin 7.6, and platelets 456.  Total bilirubin 2.0.  All other laboratory values unremarkable.  Patient afebrile and maintaining oxygen saturation above 90% on RA.  Pain persists despite  IV Dilaudid, IV fluids, and IV Toradol.  Patient admitted to [facility] for management of sickle cell pain crisis.   Review of systems:  Review of Systems  Constitutional: Negative for chills and fever.  Respiratory: Negative for sputum production and shortness of breath.   Cardiovascular: Positive for chest pain. Negative for palpitations.  Gastrointestinal: Negative.   Genitourinary: Negative for dysuria and urgency.  Musculoskeletal: Positive for back pain and joint pain.  Neurological: Negative.   Endo/Heme/Allergies: Negative.   Psychiatric/Behavioral: Negative.     A full 10 point Review of Systems was done, except as stated above, all other Review of Systems were negative.  With Past History of the following :   Past Medical History:  Diagnosis Date  . Blood transfusion    "lots"  . Chronic back pain    "very severe; have knot in my back; from tight muscle; take RX and exercise for it"  . Depression 01/06/2011  . Exertional dyspnea    "sometimes"  . Genital HSV   . GERD (gastroesophageal reflux disease) 02/17/2011  . Migraines 11/08/11   "@ least twice/month"  . 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  . Mood swings 11/08/11   "I go back and forth; real bad"  . Sickle cell anemia ([name])   . Sickle cell anemia with crisis ([place])   . Thrombocytosis ([place]) 11/09/2015  . Trichotillomania    h/o      Past Surgical History:  Procedure Laterality Date  . CHOLECYSTECTOMY  05/2010  . DILATION AND CURETTAGE OF UTERUS  02/20/11   S/P miscarriage  .  IR GENERIC HISTORICAL  12/23/2015   IR FLUORO GUIDE CV LINE RIGHT 12/23/2015 Jacqulynn Cadet, MD [facility]  . IR GENERIC HISTORICAL  12/23/2015   IR US GUIDE VASC ACCESS RIGHT 12/23/2015 Jacqulynn Cadet, MD [facility]     Social History:   Social History   Tobacco Use  . Smoking status: Current Some Day Smoker    Packs/day: 0.25    Years: 1.00    Pack years: 0.25    Types: Cigarettes    Last attempt to quit:  03/25/2013    Years since quitting: 5.7  . Smokeless tobacco: Never Used  Substance Use Topics  . Alcohol use: No    Comment: denies     Lives - At home   Family History :   Family History  Problem Relation Age of Onset  . Sickle cell trait Mother   . Sickle cell trait Father   . Diabetes Maternal Grandmother   . Diabetes Paternal Grandmother   . Hypertension Paternal Grandmother   . Diabetes Maternal Grandfather      Home Medications:   Prior to Admission medications   Medication Sig Start Date End Date Taking? Authorizing Provider  acetaminophen (TYLENOL) 650 MG CR tablet Take 650 mg by mouth every 8 (eight) hours as needed for pain.   Yes [provider]  folic acid (FOLVITE) 1 MG tablet Take 1 mg by mouth daily.   Yes [provider]  gabapentin (NEURONTIN) 300 MG capsule Take 1 capsule (300 mg total) by mouth 3 (three) times daily. 11/06/18  Yes Dorena Dew, FNP  hydroxyurea (HYDREA) 500 MG capsule Take 500 mg by mouth 2 (two) times a day.   Yes [provider]  ibuprofen (ADVIL) 800 MG tablet Take 1 tablet (800 mg total) by mouth every 8 (eight) hours as needed for moderate pain. 09/17/18  Yes Long, Wonda Olds, MD  ondansetron (ZOFRAN) 4 MG tablet Take 1 tablet (4 mg total) by mouth daily as needed for nausea or vomiting. Patient not taking: Reported on 12/10/2018 10/25/18 10/25/19  Dorena Dew, FNP     Allergies:   Allergies  Allergen Reactions  . Beta-Carotene Hives and Swelling    Had hives and swelling in mouth and neck area  . Carrot [Daucus Carota] Hives and Swelling    Allergic to carrots  . Latex Rash and Hives     Physical Exam:   Vitals:   Vitals:   12/10/18 1330 12/10/18 1430  BP: (!) 149/111 (!) 119/103  Pulse: 82 93  Resp: 16 19  Temp:    SpO2: 99% 98%    Physical Exam: Constitutional: Patient appears well-developed and well-nourished. Not in obvious distress. HENT: Normocephalic, atraumatic, External right  and left ear normal. Oropharynx is clear and moist.  Eyes: Conjunctivae and EOM are normal. PERRLA, no scleral icterus. Neck: Normal ROM. Neck supple. No JVD. No tracheal deviation. No thyromegaly. CVS: RRR, S1/S2 +, no murmurs, no gallops, no carotid bruit.  Pulmonary: Effort and breath sounds normal, no stridor, rhonchi, wheezes, rales.  Abdominal: Soft. BS +, no distension, tenderness, rebound or guarding.  Musculoskeletal: Normal range of motion. No edema and no tenderness.  Lymphadenopathy: No lymphadenopathy noted, cervical, inguinal or axillary Neuro: Alert. Normal reflexes, muscle tone coordination. No cranial nerve deficit. Skin: Skin is warm and dry. No rash noted. Not diaphoretic. No erythema. No pallor. Psychiatric: Normal mood and affect. Behavior, judgment, thought content normal.   Data Review:   CBC Recent Labs  Lab 12/10/18 1052  WBC 11.0*  HGB 7.6*  HCT 22.4*  PLT 456*  MCV 96.6  MCH 32.8  MCHC 33.9  RDW 17.2*  LYMPHSABS 2.0  MONOABS 1.1*  EOSABS 0.3  BASOSABS 0.1   ------------------------------------------------------------------------------------------------------------------  Chemistries  Recent Labs  Lab 12/10/18 1052  NA 140  K 3.5  CL 110  CO2 24  GLUCOSE 82  BUN 9  CREATININE 0.73  CALCIUM 8.1*  AST 16  ALT 18  ALKPHOS 52  BILITOT 2.0*   ------------------------------------------------------------------------------------------------------------------ estimated creatinine clearance is 110.5 mL/min (by C-G formula based on SCr of 0.73 mg/dL). ------------------------------------------------------------------------------------------------------------------ No results for input(s): TSH, T4TOTAL, T3FREE, THYROIDAB in the last 72 hours.  Invalid input(s): FREET3  Coagulation profile No results for input(s): INR, PROTIME in the last 168  hours. ------------------------------------------------------------------------------------------------------------------- No results for input(s): DDIMER in the last 72 hours. -------------------------------------------------------------------------------------------------------------------  Cardiac Enzymes No results for input(s): CKMB, TROPONINI, MYOGLOBIN in the last 168 hours.  Invalid input(s): CK ------------------------------------------------------------------------------------------------------------------ No results found for: BNP  ---------------------------------------------------------------------------------------------------------------  Urinalysis    Component Value Date/Time   COLORURINE YELLOW 10/30/2018 2218   APPEARANCEUR CLEAR 10/30/2018 2218   APPEARANCEUR Clear 05/06/2012 1546   LABSPEC 1.008 10/30/2018 2218   LABSPEC 1.011 05/06/2012 1546   PHURINE 6.0 10/30/2018 2218   GLUCOSEU NEGATIVE 10/30/2018 2218   GLUCOSEU Negative 05/06/2012 1546   HGBUR NEGATIVE 10/30/2018 2218   HGBUR negative 01/08/2009 1329   BILIRUBINUR neg 11/06/2018 1424   BILIRUBINUR Negative 05/06/2012 [place] 10/30/2018 2218   PROTEINUR Negative 11/06/2018 1424   PROTEINUR NEGATIVE 10/30/2018 2218   UROBILINOGEN 0.2 11/06/2018 1424   UROBILINOGEN 0.2 07/24/2017 1347   NITRITE neg 11/06/2018 1424   NITRITE NEGATIVE 10/30/2018 2218   LEUKOCYTESUR Negative 11/06/2018 1424   LEUKOCYTESUR NEGATIVE 10/30/2018 2218   LEUKOCYTESUR Negative 05/06/2012 1546    ----------------------------------------------------------------------------------------------------------------   Imaging Results:    Dg Chest Port 1 View  Result Date: 12/10/2018 CLINICAL DATA:  28 year old female with sickle cell pain crisis. Mid chest pain and shortness of breath. EXAM: PORTABLE CHEST 1 VIEW COMPARISON:  Portable chest 11/20/2018 and earlier. FINDINGS: Portable AP semi upright view at 1014  hours. Stable right chest power port, no longer accessed. Stable lung volumes and mediastinal contours. Allowing for portable technique the lungs are clear. Visualized tracheal air column is within normal limits. Borderline to mild cardiomegaly. Paucity bowel gas in the upper abdomen. No acute osseous abnormality identified. IMPRESSION: No acute cardiopulmonary abnormality. Electronically Signed   By: Genevie Ann M.D.   On: 12/10/2018 10:52      Assessment & Plan:  Active Problems:   Sickle cell pain crisis ([name])   Anemia of chronic disease   Leukocytosis   Sickle Cell Disease with pain crisis:  Admit, start IVF D .45% Saline @ 75 mls/hour,  Patient opiate naive  IV Toradol 30 mg Q 6 H, Monitor vitals very closely  Re-evaluate pain scale regularly, 2 L of Oxygen by [place], Patient will be re-evaluated for pain in the context of function and relationship to baseline as care progresses.  Chronic pain syndrome:  Gabapentin 300 mg every 8 hours Tylenol 650 mg every 6 hours    Leukocytosis:  WBCs 11.0, patient afebrile, no signs of infection, suspected to be reactive.  Follow CBC   Sickle cell anemia:  Hemoglobin 7.6, consistent with patient's baseline. No clinical indication for blood transfusion at this time.  Continue folic acid 1 mg Continue hydroxyurea as previously prescribed  DVT Prophylaxis: Subcut Lovenox   AM Labs Ordered, also please review Full Orders  Family Communication: Admission, patient's condition and plan of care including tests being ordered have been discussed with the patient who indicate understanding and agree with the plan and Code Status.  Code Status: Full Code  Consults called: None    Admission status: Inpatient    Time spent in minutes : 50 minutes [name], MSN, FNP-C Patient [facility] [address] [place], [place] [address] [phone]  12/10/2018 at 2:42 PM

## 2018-12-10 NOTE — ED Notes (Signed)
Rpt called rdy to transport

## 2018-12-10 NOTE — ED Triage Notes (Signed)
EMS reports from home, [place] starting yesterday, Pt c/o back pain, states it usually coincides with menstrual cycle, Pt also c/o chest wall pain.  BP 109/70 HR 70 RR 16 Sp02 99 RA

## 2018-12-10 NOTE — ED Provider Notes (Signed)
[facility] DEPT Provider Note   CSN: [id number] Arrival date & time: 12/10/18  4098    History   Chief Complaint Chief Complaint  Patient presents with  . Sickle Cell Pain Crisis    HPI [name] is a 28 y.o. female with a PMH of Sickle Cell Anemia, Depression, Migraines, DVT in LUE in 2017 and chronic back pain presents with Sickle Cell pain crisis onset yesterday. Patient arrived via EMS from home. Patient reports bilateral non radiating lower back pain onset yesterday. Patient also reports central chest pain onset this morning. Patient describes pain as a constant sharp and an ache. Patient states pain is worse with her menstrual period and certain movements. Patient denies taking blood thinners. Patient states she has taken ibuprofen and tylenol without relief. Patient denies taking any pain medicine at home due to history of addition in the past. Patient denies shortness of breath, fever, chills, nausea, vomiting, diarrhea, abdominal pain, cough, congestion, sick exposures, or recent travel.      HPI  Past Medical History:  Diagnosis Date  . Blood transfusion    "lots"  . Chronic back pain    "very severe; have knot in my back; from tight muscle; take RX and exercise for it"  . Depression 01/06/2011  . Exertional dyspnea    "sometimes"  . Genital HSV   . GERD (gastroesophageal reflux disease) 02/17/2011  . Migraines 11/08/11   "@ least twice/month"  . 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  . Mood swings 11/08/11   "I go back and forth; real bad"  . Sickle cell anemia ([name])   . Sickle cell anemia with crisis ([place])   . Thrombocytosis ([place]) 11/09/2015  . Trichotillomania    h/o    Patient Active Problem List   Diagnosis Date Noted  . Sickle cell anemia with crisis ([place]) 12/22/2017  . 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 Chest pain 09/29/2017  . Sickle cell crisis ([place]) 09/29/2017  . Leucocytosis   . Hb-SS disease with  crisis ([place]) 08/20/2016  . Chronic anticoagulation   . Sickle cell anemia ([place]) 05/19/2016  . History of DVT (deep vein thrombosis) 04/17/2016  . Thrombocytosis ([place]) 11/09/2015  . Hyperbilirubinemia 11/09/2015  . Substance abuse ([place]) 09/26/2015  . Herpes simplex 07/14/2015  . Anemia of chronic disease   . Leukocytosis   . Sickle cell pain crisis ([place]) 03/29/2012  . 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 Hb-SS disease without crisis ([place]) 03/22/1992    Past Surgical History:  Procedure Laterality Date  . CHOLECYSTECTOMY  05/2010  . DILATION AND CURETTAGE OF UTERUS  02/20/11   S/P miscarriage  . IR GENERIC HISTORICAL  12/23/2015   IR FLUORO GUIDE CV LINE RIGHT 12/23/2015 Jacqulynn Cadet, MD [facility]  . IR GENERIC HISTORICAL  12/23/2015   IR US GUIDE VASC ACCESS RIGHT 12/23/2015 Jacqulynn Cadet, MD [facility]     OB History    Gravida  5   Para  2   Term  2   Preterm  0   AB  3   Living  2     SAB  3   TAB  0   Ectopic  0   Multiple  0   Live Births  2        Obstetric Comments  Miscarried in October 2012 at about 7 weeks         Home Medications    Prior  to Admission medications   Medication Sig Start Date End Date Taking? Authorizing Provider  acetaminophen (TYLENOL) 650 MG CR tablet Take 650 mg by mouth every 8 (eight) hours as needed for pain.   Yes [provider]  folic acid (FOLVITE) 1 MG tablet Take 1 mg by mouth daily.   Yes [provider]  gabapentin (NEURONTIN) 300 MG capsule Take 1 capsule (300 mg total) by mouth 3 (three) times daily. 11/06/18  Yes Dorena Dew, FNP  hydroxyurea (HYDREA) 500 MG capsule Take 500 mg by mouth 2 (two) times a day.   Yes [provider]  ibuprofen (ADVIL) 800 MG tablet Take 1 tablet (800 mg total) by mouth every 8 (eight) hours as needed for moderate pain. 09/17/18  Yes Long, Wonda Olds, MD  ondansetron (ZOFRAN) 4 MG tablet Take 1 tablet (4 mg  total) by mouth daily as needed for nausea or vomiting. Patient not taking: Reported on 12/10/2018 10/25/18 10/25/19  Dorena Dew, FNP    Family History Family History  Problem Relation Age of Onset  . Sickle cell trait Mother   . Sickle cell trait Father   . Diabetes Maternal Grandmother   . Diabetes Paternal Grandmother   . Hypertension Paternal Grandmother   . Diabetes Maternal Grandfather     Social History Social History   Tobacco Use  . Smoking status: Current Some Day Smoker    Packs/day: 0.25    Years: 1.00    Pack years: 0.25    Types: Cigarettes    Last attempt to quit: 03/25/2013    Years since quitting: 5.7  . Smokeless tobacco: Never Used  Substance Use Topics  . Alcohol use: No    Comment: denies  . Drug use: No    Types: Other-see comments    Comment: "Clean for 3 months"     Allergies   Beta-carotene, Carrot [daucus carota], and Latex   Review of Systems Review of Systems  Constitutional: Negative for chills, diaphoresis and fever.  HENT: Negative for congestion and sore throat.   Eyes: Negative for visual disturbance.  Respiratory: Negative for cough and shortness of breath.   Cardiovascular: Positive for chest pain. Negative for palpitations and leg swelling.  Gastrointestinal: Negative for abdominal pain, diarrhea, nausea and vomiting.  Endocrine: Negative for cold intolerance and heat intolerance.  Genitourinary: Negative for dysuria.  Musculoskeletal: Positive for back pain. Negative for gait problem, joint swelling, myalgias, neck pain and neck stiffness.  Skin: Negative for color change, rash and wound.  Allergic/Immunologic: Negative for immunocompromised state.  Neurological: Negative for weakness, numbness and headaches.  Hematological: Negative for adenopathy.    Physical Exam Updated Vital Signs BP (!) 119/103   Pulse 93   Temp 98.9 F (37.2 C) (Oral)   Resp 19   Ht 5\' 1"  (1.549 m)   Wt 92.5 kg   LMP 12/10/2018   SpO2  98%   BMI 38.55 kg/m   Physical Exam Vitals signs and nursing note reviewed.  Constitutional:      General: She is not in acute distress.    Appearance: She is well-developed. She is not diaphoretic.  HENT:     Head: Normocephalic and atraumatic.  Neck:     Musculoskeletal: Normal range of motion.  Cardiovascular:     Rate and Rhythm: Normal rate and regular rhythm.     Heart sounds: Normal heart sounds. No murmur. No friction rub. No gallop.   Pulmonary:     Effort: Pulmonary  effort is normal. No respiratory distress.     Breath sounds: Normal breath sounds. No wheezing or rales.  Chest:     Chest wall: Tenderness (Central chest wall tenderness to palpation. ) present. No deformity or swelling.     Comments: Port present in right upper chest.  Abdominal:     Palpations: Abdomen is soft.     Tenderness: There is no abdominal tenderness.  Musculoskeletal:     Cervical back: Normal. She exhibits normal range of motion, no tenderness and no bony tenderness.     Thoracic back: Normal. She exhibits normal range of motion, no tenderness and no bony tenderness.     Lumbar back: She exhibits decreased range of motion and tenderness. She exhibits no bony tenderness, no swelling, no edema, no deformity and no laceration.     Right lower leg: No edema.     Left lower leg: No edema.     Comments: No skin changes noted. Decreased ROM of lumbar spine due to pain. Bilateral paraspinal tenderness to palpation of lumbar spine. No midline tenderness upon palpation of cervical, thoracic, or lumbar spine. 2+ radial and DP pulses. Sensation intact. 5/5 strength in upper and lower extremities. Patient is able to ambulate with some discomfort.   Skin:    General: Skin is warm.     Findings: No erythema or rash.  Neurological:     Mental Status: She is alert.      ED Treatments / Results  Labs (all labs ordered are listed, but only abnormal results are displayed) Labs Reviewed  COMPREHENSIVE  METABOLIC PANEL - Abnormal; Notable for the following components:      Result Value   Calcium 8.1 (*)    Total Bilirubin 2.0 (*)    All other components within normal limits  CBC WITH DIFFERENTIAL/PLATELET - Abnormal; Notable for the following components:   WBC 11.0 (*)    RBC 2.32 (*)    Hemoglobin 7.6 (*)    HCT 22.4 (*)    RDW 17.2 (*)    Platelets 456 (*)    nRBC 0.9 (*)    Monocytes Absolute 1.1 (*)    Abs Immature Granulocytes 0.18 (*)    All other components within normal limits  RETICULOCYTES - Abnormal; Notable for the following components:   Retic Ct Pct 13.8 (*)    RBC. 2.32 (*)    Retic Count, Absolute 319.2 (*)    Immature Retic Fract 30.9 (*)    All other components within normal limits  SARS CORONAVIRUS 2 (HOSPITAL ORDER, [facility])  URINALYSIS, ROUTINE W REFLEX MICROSCOPIC  I-STAT BETA HCG BLOOD, ED ([facility], [facility], [facility] ONLY)  TROPONIN I (HIGH SENSITIVITY)  TROPONIN I (HIGH SENSITIVITY)    EKG EKG Interpretation  Date/Time:  Monday December 10 2018 10:57:58 EDT Ventricular Rate:  78 PR Interval:    QRS Duration: 107 QT Interval:  392 QTC Calculation: 447 R Axis:   63 Text Interpretation:  Sinus rhythm normal, no change Confirmed by Charlesetta Shanks [phone]) on 12/10/2018 1:58:35 PM   Radiology Dg Chest Port 1 View  Result Date: 12/10/2018 CLINICAL DATA:  28 year old female with sickle cell pain crisis. Mid chest pain and shortness of breath. EXAM: PORTABLE CHEST 1 VIEW COMPARISON:  Portable chest 11/20/2018 and earlier. FINDINGS: Portable AP semi upright view at 1014 hours. Stable right chest power port, no longer accessed. Stable lung volumes and mediastinal contours. Allowing for portable technique the lungs are clear. Visualized tracheal  air column is within normal limits. Borderline to mild cardiomegaly. Paucity bowel gas in the upper abdomen. No acute osseous abnormality identified. IMPRESSION: No acute cardiopulmonary abnormality.  Electronically Signed   By: Genevie Ann M.D.   On: 12/10/2018 10:52    Procedures Procedures (including critical care time)  Medications Ordered in ED Medications  ondansetron (ZOFRAN) injection 4 mg (4 mg Intravenous Given 12/10/18 1056)  dextrose 5 %-0.45 % sodium chloride infusion ( Intravenous New Bag/Given 12/10/18 1055)  HYDROmorphone (DILAUDID) injection 1 mg (1 mg Intravenous Given 12/10/18 1308)    Or  HYDROmorphone (DILAUDID) injection 1 mg ( Subcutaneous See Alternative 12/10/18 1308)  diphenhydrAMINE (BENADRYL) injection 25 mg (25 mg Intravenous Given 12/10/18 1057)  ketorolac (TORADOL) 30 MG/ML injection 30 mg (30 mg Intravenous Given 12/10/18 1055)  HYDROmorphone (DILAUDID) injection 0.5 mg (0.5 mg Intravenous Given 12/10/18 1056)    Or  HYDROmorphone (DILAUDID) injection 0.5 mg ( Subcutaneous See Alternative 12/10/18 1056)  HYDROmorphone (DILAUDID) injection 1 mg (1 mg Intravenous Given 12/10/18 1153)    Or  HYDROmorphone (DILAUDID) injection 1 mg ( Subcutaneous See Alternative 12/10/18 1153)  HYDROmorphone (DILAUDID) injection 1 mg (1 mg Intravenous Given 12/10/18 1056)    Or  HYDROmorphone (DILAUDID) injection 1 mg ( Subcutaneous See Alternative 12/10/18 1056)  ondansetron (ZOFRAN) injection 4 mg (4 mg Intravenous Given 12/10/18 1307)  diphenhydrAMINE (BENADRYL) injection 12.5 mg (12.5 mg Intravenous Given 12/10/18 1308)     Initial Impression / Assessment and Plan / ED Course  I have reviewed the triage vital signs and the nursing notes.  Pertinent labs & imaging results that were available during my care of the patient were reviewed by me and considered in my medical decision making (see chart for details).  Clinical Course as of Dec 09 1441  Mon Dec 10, 2018  1113 No acute cardiopulmonary abnormality.  DG Chest Port 1 View [AH]  1140 Leukocytosis noted at 11.   WBC(!): 11.0 [AH]  1141 Hemoglobin low at 7.6.   Hemoglobin(!): 7.6 [AH]  1221 Reassessed patient. Patient states  pain has slightly improved. Will continue to monitor for pain control.   [AH]    Clinical Course User Index [AH] Arville Lime, PA-C      Patient presents due to sickle cell crisis. Patient reports chest pain and back pain. CXR is negative. EKG without acute changes. Initial troponin <2. Hemoglobin low at 7.6. Leukocytosis noted at 11.0. Provided fluids, dilaudid, toradol, benadryl, and zofran while in the ER. Patient states pain has slightly improved, but patient continues to endorse pain. Consulted sickle cell medicine due to persistent pain. CTA ordered due to chest pain and hx of DVT in the past. Ordered COVID-19 test. Sickle cell medicine agreed to admit patient.   Findings and plan of care discussed with supervising physician Dr. Johnney Killian.  Final Clinical Impressions(s) / ED Diagnoses   Final diagnoses:  Sickle cell pain crisis ([place])  Acute bilateral low back pain without sciatica  Chest pain, atypical    ED Discharge Orders    None       Arville Lime, [place] 12/10/18 1444    Charlesetta Shanks, MD 12/15/18 1059

## 2018-12-10 NOTE — ED Notes (Signed)
Pure wick has been placed. Suction set to 32mmHg. Pt requested pure wick.

## 2018-12-11 ENCOUNTER — Ambulatory Visit: Payer: Self-pay | Admitting: Family Medicine

## 2018-12-11 LAB — COMPREHENSIVE METABOLIC PANEL
ALT: 25 U/L (ref 0–44)
AST: 24 U/L (ref 15–41)
Albumin: 3.9 g/dL (ref 3.5–5.0)
Alkaline Phosphatase: 73 U/L (ref 38–126)
Anion gap: 4 — ABNORMAL LOW (ref 5–15)
BUN: 11 mg/dL (ref 6–20)
CO2: 27 mmol/L (ref 22–32)
Calcium: 8.4 mg/dL — ABNORMAL LOW (ref 8.9–10.3)
Chloride: 107 mmol/L (ref 98–111)
Creatinine, Ser: 0.7 mg/dL (ref 0.44–1.00)
GFR calc Af Amer: >60 mL/min (ref >60)
GFR calc non Af Amer: >60 mL/min (ref >60)
Glucose, Bld: 100 mg/dL — ABNORMAL HIGH (ref 70–99)
Potassium: 3.6 mmol/L (ref 3.5–5.1)
Sodium: 138 mmol/L (ref 135–145)
Total Bilirubin: 3.2 mg/dL — ABNORMAL HIGH (ref 0.3–1.2)
Total Protein: 6.9 g/dL (ref 6.5–8.1)

## 2018-12-11 LAB — CBC WITH DIFFERENTIAL/PLATELET
Abs Immature Granulocytes: 1.06 10*3/uL — ABNORMAL HIGH (ref 0.00–0.07)
Basophils Absolute: 0.1 10*3/uL (ref 0.0–0.1)
Basophils Relative: 1 %
Eosinophils Absolute: 0.5 10*3/uL (ref 0.0–0.5)
Eosinophils Relative: 2 %
HCT: 19.9 % — ABNORMAL LOW (ref 36.0–46.0)
Hemoglobin: 6.9 g/dL — CL (ref 12.0–15.0)
Immature Granulocytes: 5 %
Lymphocytes Relative: 13 %
Lymphs Abs: 2.6 10*3/uL (ref 0.7–4.0)
MCH: 33.3 pg (ref 26.0–34.0)
MCHC: 34.7 g/dL (ref 30.0–36.0)
MCV: 96.1 fL (ref 80.0–100.0)
Monocytes Absolute: 1.6 10*3/uL — ABNORMAL HIGH (ref 0.1–1.0)
Monocytes Relative: 8 %
Neutro Abs: 14 10*3/uL — ABNORMAL HIGH (ref 1.7–7.7)
Neutrophils Relative %: 71 %
Platelets: 392 10*3/uL (ref 150–400)
RBC: 2.07 MIL/uL — ABNORMAL LOW (ref 3.87–5.11)
RDW: 17.5 % — ABNORMAL HIGH (ref 11.5–15.5)
WBC: 19.8 10*3/uL — ABNORMAL HIGH (ref 4.0–10.5)
nRBC: 2.2 % — ABNORMAL HIGH (ref 0.0–0.2)

## 2018-12-11 MED ORDER — LORAZEPAM 1 MG PO TABS
1.0000 mg | ORAL_TABLET | Freq: Once | ORAL | Status: AC
  Administered 2018-12-11: 1 mg via ORAL
  Filled 2018-12-11: qty 1

## 2018-12-11 MED ORDER — HYDROMORPHONE HCL 1 MG/ML IJ SOLN
1.0000 mg | Freq: Four times a day (QID) | INTRAMUSCULAR | Status: DC | PRN
  Administered 2018-12-11 – 2018-12-12 (×4): 1 mg via INTRAVENOUS
  Filled 2018-12-11 (×4): qty 1

## 2018-12-11 NOTE — Progress Notes (Signed)
Received critical lab value hgb 6.9. oncoming RN notified and hospitalist text paged, awaiting reply

## 2018-12-11 NOTE — Progress Notes (Signed)
CRITICAL VALUE ALERT  Critical Value:  hgb 6.9  Date & Time Notied:  [id number]  Provider Notified: yes  Orders Received/Actions taken: not yet

## 2018-12-11 NOTE — Progress Notes (Addendum)
Subjective: [name], a 28 year old female with a medical history significant for sickle cell disease, chronic pain syndrome, history of polysubstance abuse, obesity, history of trichotillomania, and history of anemia of chronic disease was admitted for sickle cell pain crisis. Patient states that pain has worsened overnight.  She is currently writhing in bed and crying uncontrollably.  Patient states that her pain is "all over".  She states that she has not been able to gain control with full dose PCA.  Patient endorses anxiety.  She denies ideations. Patient afebrile and maintaining oxygen saturation 93% on RA. Patient denies headache, paresthesias, dizziness, shortness of breath, nausea, vomiting, or diarrhea  Objective:  Vital signs in last 24 hours:  Vitals:   12/11/18 0415 12/11/18 0435 12/11/18 0931 12/11/18 1200  BP: 104/70     Pulse: (!) 109     Resp: 14 12 16 16   Temp: 97.8 F (36.6 C)     TempSrc: Oral     SpO2: 97% 100% 93% 93%  Weight:      Height:        Intake/Output from previous day:   Intake/Output Summary (Last 24 hours) at 12/11/2018 1643 Last data filed at 12/11/2018 1626 Gross per 24 hour  Intake -  Output 1900 ml  Net -1900 ml   Physical Exam Constitutional:      General: She is in acute distress (Writhing in pain).     Appearance: Normal appearance.  HENT:     Head: Normocephalic.     Mouth/Throat:     Mouth: Mucous membranes are moist.  Eyes:     General: No scleral icterus.    Pupils: Pupils are equal, round, and reactive to light.  Neck:     Musculoskeletal: Normal range of motion.  Cardiovascular:     Rate and Rhythm: Normal rate and regular rhythm.     Pulses: Normal pulses.  Pulmonary:     Effort: Pulmonary effort is normal.     Breath sounds: Normal breath sounds.  Abdominal:     General: Abdomen is flat. Bowel sounds are normal.  Musculoskeletal: Normal range of motion.  Skin:    General: Skin is warm.  Neurological:      General: No focal deficit present.     Mental Status: She is alert. Mental status is at baseline.  Psychiatric:        Mood and Affect: Mood is anxious. Affect is tearful.        Speech: Speech normal.        Thought Content: Thought content does not include homicidal or suicidal ideation.      Lab Results:  Basic Metabolic Panel:    Component Value Date/Time   NA 138 12/11/2018 0615   NA 140 11/06/2018 1456   NA 139 05/06/2012 1645   K 3.6 12/11/2018 0615   K 3.9 05/06/2012 1645   CL 107 12/11/2018 0615   CL 110 (H) 05/06/2012 1645   CO2 27 12/11/2018 0615   CO2 22 05/06/2012 1645   BUN 11 12/11/2018 0615   BUN 10 11/06/2018 1456   BUN 9 05/06/2012 1645   CREATININE 0.70 12/11/2018 0615   CREATININE 0.43 (L) 07/06/2015 1356   GLUCOSE 100 (H) 12/11/2018 0615   GLUCOSE 84 05/06/2012 1645   CALCIUM 8.4 (L) 12/11/2018 0615   CALCIUM 8.3 (L) 05/06/2012 1645   CBC:    Component Value Date/Time   WBC 19.8 (H) 12/11/2018 0615   HGB 6.9 (LL) 12/11/2018 0615  HGB 9.1 (L) 11/06/2018 1456   HCT 19.9 (L) 12/11/2018 0615   HCT 27.0 (L) 11/06/2018 1456   PLT 392 12/11/2018 0615   PLT 549 (H) 11/06/2018 1456   MCV 96.1 12/11/2018 0615   MCV 97 11/06/2018 1456   MCV 94 05/11/2012 0721   NEUTROABS 14.0 (H) 12/11/2018 0615   NEUTROABS 6.0 11/06/2018 1456   NEUTROABS 10.9 (H) 05/10/2012 0552   LYMPHSABS 2.6 12/11/2018 0615   LYMPHSABS 1.9 11/06/2018 1456   LYMPHSABS 3.5 05/10/2012 0552   MONOABS 1.6 (H) 12/11/2018 0615   MONOABS 1.7 (H) 05/10/2012 0552   EOSABS 0.5 12/11/2018 0615   EOSABS 0.2 11/06/2018 1456   EOSABS 0.8 (H) 05/10/2012 0552   BASOSABS 0.1 12/11/2018 0615   BASOSABS 0.1 11/06/2018 1456   BASOSABS 0.1 05/10/2012 0552    Recent Results (from the past 240 hour(s))  SARS Coronavirus 2 (CEPHEID - Performed in [place] [facility] hospital lab), Hosp Order     Status: None   Collection Time: 12/10/18  1:55 PM   Specimen: Nasopharyngeal Swab  Result Value Ref Range  Status   SARS Coronavirus 2 NEGATIVE NEGATIVE Final    Comment: (NOTE) If result is NEGATIVE SARS-CoV-2 target nucleic acids are NOT DETECTED. The SARS-CoV-2 RNA is generally detectable in upper and lower  respiratory specimens during the acute phase of infection. The lowest  concentration of SARS-CoV-2 viral copies this assay can detect is 250  copies / mL. A negative result does not preclude SARS-CoV-2 infection  and should not be used as the sole basis for treatment or other  patient management decisions.  A negative result may occur with  improper specimen collection / handling, submission of specimen other  than nasopharyngeal swab, presence of viral mutation(s) within the  areas targeted by this assay, and inadequate number of viral copies  (<250 copies / mL). A negative result must be combined with clinical  observations, patient history, and epidemiological information. If result is POSITIVE SARS-CoV-2 target nucleic acids are DETECTED. The SARS-CoV-2 RNA is generally detectable in upper and lower  respiratory specimens dur ing the acute phase of infection.  Positive  results are indicative of active infection with SARS-CoV-2.  Clinical  correlation with patient history and other diagnostic information is  necessary to determine patient infection status.  Positive results do  not rule out bacterial infection or co-infection with other viruses. If result is PRESUMPTIVE POSTIVE SARS-CoV-2 nucleic acids MAY BE PRESENT.   A presumptive positive result was obtained on the submitted specimen  and confirmed on repeat testing.  While 2019 novel coronavirus  (SARS-CoV-2) nucleic acids may be present in the submitted sample  additional confirmatory testing may be necessary for epidemiological  and / or clinical management purposes  to differentiate between  SARS-CoV-2 and other Sarbecovirus currently known to infect humans.  If clinically indicated additional testing with an alternate  test  methodology [phone]) is advised. The SARS-CoV-2 RNA is generally  detectable in upper and lower respiratory sp ecimens during the acute  phase of infection. The expected result is Negative. Fact Sheet for Patients:  StrictlyIdeas.no Fact Sheet for Healthcare Providers: BankingDealers.co.za This test is not yet approved or cleared by the [place] FDA and has been authorized for detection and/or diagnosis of SARS-CoV-2 by FDA under an Emergency Use Authorization (EUA).  This EUA will remain in effect (meaning this test can be used) for the duration of the COVID-19 declaration under Section 564(b)(1) of the Act, 21 U.S.C. section 360bbb-3(b)(1),  unless the authorization is terminated or revoked sooner. Performed at [facility], [place] [address], [place], [place] [address]     Studies/Results: Ct Angio Chest Pe W/cm &/or Wo Cm  Result Date: 12/10/2018 CLINICAL DATA:  PE suspected EXAM: CT ANGIOGRAPHY CHEST WITH CONTRAST TECHNIQUE: Multidetector CT imaging of the chest was performed using the standard protocol during bolus administration of intravenous contrast. Multiplanar CT image reconstructions and MIPs were obtained to evaluate the vascular anatomy. CONTRAST:  128mL OMNIPAQUE IOHEXOL 350 MG/ML SOLN COMPARISON:  02/27/2017 FINDINGS: Cardiovascular: Satisfactory opacification of the pulmonary arteries to the segmental level. No evidence of pulmonary embolism. Mild cardiomegaly. No pericardial effusion. Right chest port catheter. Venous collaterals about the chest wall. Mediastinum/Nodes: No enlarged mediastinal, hilar, or axillary lymph nodes. Thyroid gland, trachea, and esophagus demonstrate no significant findings. Lungs/Pleura: Lungs are clear. Stable, benign small pulmonary nodule of the left lower lobe (series 12, image 56). No pleural effusion or pneumothorax. Upper Abdomen: No acute abnormality.  Partial splenic  involution. Musculoskeletal: No chest wall abnormality. H-shaped vertebral body endplate deformities. Review of the MIP images confirms the above findings. IMPRESSION: 1.  Negative examination for pulmonary embolism. 2.  Splenic and osseous stigmata of sickle cell disease. Electronically Signed   By: Eddie Candle M.D.   On: 12/10/2018 16:13   Dg Chest Port 1 View  Result Date: 12/10/2018 CLINICAL DATA:  28 year old female with sickle cell pain crisis. Mid chest pain and shortness of breath. EXAM: PORTABLE CHEST 1 VIEW COMPARISON:  Portable chest 11/20/2018 and earlier. FINDINGS: Portable AP semi upright view at 1014 hours. Stable right chest power port, no longer accessed. Stable lung volumes and mediastinal contours. Allowing for portable technique the lungs are clear. Visualized tracheal air column is within normal limits. Borderline to mild cardiomegaly. Paucity bowel gas in the upper abdomen. No acute osseous abnormality identified. IMPRESSION: No acute cardiopulmonary abnormality. Electronically Signed   By: Genevie Ann M.D.   On: 12/10/2018 10:52    Medications: Scheduled Meds: . enoxaparin (LOVENOX) injection  40 mg Subcutaneous Q24H  . folic acid  1 mg Oral Daily  . gabapentin  300 mg Oral TID  . HYDROmorphone   Intravenous Q4H  . hydroxyurea  500 mg Oral BID  . ketorolac  15 mg Intravenous Q6H  . senna-docusate  1 tablet Oral BID   Continuous Infusions: . dextrose 5 % and 0.45% NaCl 75 mL/hr at 12/11/18 0935   PRN Meds:.acetaminophen, diphenhydrAMINE **OR** diphenhydrAMINE, HYDROmorphone (DILAUDID) injection, iohexol, naloxone **AND** sodium chloride flush, ondansetron, polyethylene glycol  Assessment/Plan: Active Problems:   Sickle cell pain crisis ([name])   Anemia of chronic disease   Leukocytosis   Chest pain, atypical   Acute bilateral low back pain without sciatica  Sickle cell disease with pain crisis: Continue full dose Dilaudid PCA Dilaudid 1 mg IV every 6 hours as needed  for severe breakthrough pain Continue IV fluids, D5 0.45% saline at 50 mL/h IV Toradol 15 mg every 6 hours Reevaluate pain scale regularly 2 L supplemental oxygen as needed  Leukocytosis: WBCs increased to 19.8.  Patient afebrile.  No signs of infection or inflammation.  Continue to monitor closely. Repeat CBC in a.m.  Sickle cell anemia: Hemoglobin decreased to 6.9, which is below baseline.  Patient typically transfused hemoglobin is less than 6.0 g/dL. Reduce IV fluids, repeat CBC in a.m. Code Status: Full Code Family Communication: N/A Disposition Plan: Not yet ready for discharge  Kalon Erhardt Al Decant  APRN, MSN, FNP-C  [facility] [place], [place] [address] [phone]  If 5PM-7AM, please contact night-coverage.  12/11/2018, 4:43 PM  LOS: 1 day

## 2018-12-12 ENCOUNTER — Inpatient Hospital Stay (HOSPITAL_COMMUNITY): Payer: Medicare Other

## 2018-12-12 ENCOUNTER — Ambulatory Visit: Payer: Medicare Other | Admitting: Obstetrics & Gynecology

## 2018-12-12 DIAGNOSIS — R6510 Systemic inflammatory response syndrome (SIRS) of non-infectious origin without acute organ dysfunction: Secondary | ICD-10-CM

## 2018-12-12 LAB — BASIC METABOLIC PANEL
Anion gap: 9 (ref 5–15)
BUN: 7 mg/dL (ref 6–20)
CO2: 26 mmol/L (ref 22–32)
Calcium: 8.3 mg/dL — ABNORMAL LOW (ref 8.9–10.3)
Chloride: 101 mmol/L (ref 98–111)
Creatinine, Ser: 0.56 mg/dL (ref 0.44–1.00)
GFR calc Af Amer: >60 mL/min (ref >60)
GFR calc non Af Amer: >60 mL/min (ref >60)
Glucose, Bld: 109 mg/dL — ABNORMAL HIGH (ref 70–99)
Potassium: 3.4 mmol/L — ABNORMAL LOW (ref 3.5–5.1)
Sodium: 136 mmol/L (ref 135–145)

## 2018-12-12 LAB — CBC
HCT: 19.5 % — ABNORMAL LOW (ref 36.0–46.0)
Hemoglobin: 6.7 g/dL — CL (ref 12.0–15.0)
MCH: 33 pg (ref 26.0–34.0)
MCHC: 34.4 g/dL (ref 30.0–36.0)
MCV: 96.1 fL (ref 80.0–100.0)
Platelets: 409 10*3/uL — ABNORMAL HIGH (ref 150–400)
RBC: 2.03 MIL/uL — ABNORMAL LOW (ref 3.87–5.11)
RDW: 17.8 % — ABNORMAL HIGH (ref 11.5–15.5)
WBC: 23.6 10*3/uL — ABNORMAL HIGH (ref 4.0–10.5)
nRBC: 6.9 % — ABNORMAL HIGH (ref 0.0–0.2)

## 2018-12-12 MED ORDER — SODIUM CHLORIDE 0.9% FLUSH: 9.0000 mL | INTRAVENOUS | Status: DC | PRN

## 2018-12-12 MED ORDER — SODIUM CHLORIDE 0.9 % IV SOLN
1.0000 g | INTRAVENOUS | Status: DC
  Administered 2018-12-12 – 2018-12-17 (×6): 1 g via INTRAVENOUS
  Filled 2018-12-12 (×3): qty 1
  Filled 2018-12-12 (×2): qty 10
  Filled 2018-12-12: qty 1

## 2018-12-12 MED ORDER — NALOXONE HCL 0.4 MG/ML IJ SOLN: 0.4000 mg | INTRAMUSCULAR | Status: DC | PRN

## 2018-12-12 MED ORDER — ONDANSETRON HCL 4 MG/2ML IJ SOLN: 4.0000 mg | Freq: Four times a day (QID) | INTRAMUSCULAR | Status: DC | PRN

## 2018-12-12 MED ORDER — DIPHENHYDRAMINE HCL 12.5 MG/5ML PO ELIX
12.5000 mg | ORAL_SOLUTION | Freq: Four times a day (QID) | ORAL | Status: DC | PRN
  Filled 2018-12-12 (×2): qty 5

## 2018-12-12 MED ORDER — HYDROMORPHONE 1 MG/ML IV SOLN
INTRAVENOUS | Status: DC
  Administered 2018-12-12: 30 mg via INTRAVENOUS
  Administered 2018-12-12: 2 mg via INTRAVENOUS
  Administered 2018-12-12: 1 mg via INTRAVENOUS
  Administered 2018-12-13: 0 mg via INTRAVENOUS
  Administered 2018-12-13: 3 mg via INTRAVENOUS
  Administered 2018-12-13: 4.5 mg via INTRAVENOUS
  Administered 2018-12-13: 0 mg via INTRAVENOUS
  Filled 2018-12-12: qty 30

## 2018-12-12 NOTE — Progress Notes (Signed)
Subjective: [name], a 28 year old female with a medical history significant for sickle cell disease, type SS, chronic pain syndrome, history of polysubstance abuse, history of anemia of chronic disease, and history of trichotillomania was admitted for sickle cell pain crisis.  Patient was reported to have low-grade temperature throughout the morning.  WBCs increased to 23.6 from 19.8 on yesterday.  Patient's chest x-ray shows no acute cardiopulmonary process.  Blood cultures, urinalysis, and urine culture pending.  COVID-19 test negative on admission.  Patient states that pain intensity has not improved.  She states that she has been unable to move right leg without excruciating pain.  Patient rates pain intensity as 10/10.  She is requesting an increase and pain medications.  Patient is very tearful and writhing in bed. Pain is characterized as constant and sharp.  Objective:  Vital signs in last 24 hours:  Vitals:   12/12/18 0750 12/12/18 0759 12/12/18 1151 12/12/18 1221  BP:    128/79  Pulse: (!) 115   (!) 115  Resp:  16 11 16   Temp: 99.1 F (37.3 C)   100.3 F (37.9 C)  TempSrc: Oral   Oral  SpO2: 98% 95% 94% 95%  Weight:      Height:        Intake/Output from previous day:   Intake/Output Summary (Last 24 hours) at 12/12/2018 1229 Last data filed at 12/12/2018 1100 Gross per 24 hour  Intake 4460 ml  Output 4320 ml  Net 140 ml    Physical Exam: General: Alert, awake, oriented x3, writhing in pain HEENT: [place]/AT [place], EOMI scleral icterus Neck: Trachea midline,  no masses, no thyromegal,y no JVD, no carotid bruit OROPHARYNX:  Moist, No exudate/ erythema/lesions.  Heart: Regular rate and rhythm, without murmurs, rubs, gallops, PMI non-displaced, no heaves or thrills on palpation.  Lungs: Clear to auscultation, no wheezing or rhonchi noted. No increased vocal fremitus resonant to percussion  Abdomen: Soft, nontender, nondistended, positive bowel sounds, no masses no  hepatosplenomegaly noted..  Neuro: No focal neurological deficits noted cranial nerves II through XII grossly intact. DTRs 2+ bilaterally upper and lower extremities. Strength 5 out of 5 in bilateral upper and lower extremities. Musculoskeletal: No warm swelling or erythema around joints, no spinal tenderness noted. Psychiatric: Patient alert and oriented x3, patient tearful, anxious Lymph node survey: No cervical axillary or inguinal lymphadenopathy noted.  Lab Results:  Basic Metabolic Panel:    Component Value Date/Time   NA 136 12/12/2018 1039   NA 140 11/06/2018 1456   NA 139 05/06/2012 1645   K 3.4 (L) 12/12/2018 1039   K 3.9 05/06/2012 1645   CL 101 12/12/2018 1039   CL 110 (H) 05/06/2012 1645   CO2 26 12/12/2018 1039   CO2 22 05/06/2012 1645   BUN 7 12/12/2018 1039   BUN 10 11/06/2018 1456   BUN 9 05/06/2012 1645   CREATININE 0.56 12/12/2018 1039   CREATININE 0.43 (L) 07/06/2015 1356   GLUCOSE 109 (H) 12/12/2018 1039   GLUCOSE 84 05/06/2012 1645   CALCIUM 8.3 (L) 12/12/2018 1039   CALCIUM 8.3 (L) 05/06/2012 1645   CBC:    Component Value Date/Time   WBC 23.6 (H) 12/12/2018 0430   HGB 6.7 (LL) 12/12/2018 0430   HGB 9.1 (L) 11/06/2018 1456   HCT 19.5 (L) 12/12/2018 0430   HCT 27.0 (L) 11/06/2018 1456   PLT 409 (H) 12/12/2018 0430   PLT 549 (H) 11/06/2018 1456   MCV 96.1 12/12/2018 0430   MCV  97 11/06/2018 1456   MCV 94 05/11/2012 0721   NEUTROABS 14.0 (H) 12/11/2018 0615   NEUTROABS 6.0 11/06/2018 1456   NEUTROABS 10.9 (H) 05/10/2012 0552   LYMPHSABS 2.6 12/11/2018 0615   LYMPHSABS 1.9 11/06/2018 1456   LYMPHSABS 3.5 05/10/2012 0552   MONOABS 1.6 (H) 12/11/2018 0615   MONOABS 1.7 (H) 05/10/2012 0552   EOSABS 0.5 12/11/2018 0615   EOSABS 0.2 11/06/2018 1456   EOSABS 0.8 (H) 05/10/2012 0552   BASOSABS 0.1 12/11/2018 0615   BASOSABS 0.1 11/06/2018 1456   BASOSABS 0.1 05/10/2012 0552    Recent Results (from the past 240 hour(s))  SARS Coronavirus 2  (CEPHEID - Performed in [place] hospital lab), Hosp Order     Status: None   Collection Time: 12/10/18  1:55 PM   Specimen: Nasopharyngeal Swab  Result Value Ref Range Status   SARS Coronavirus 2 NEGATIVE NEGATIVE Final    Comment: (NOTE) If result is NEGATIVE SARS-CoV-2 target nucleic acids are NOT DETECTED. The SARS-CoV-2 RNA is generally detectable in upper and lower  respiratory specimens during the acute phase of infection. The lowest  concentration of SARS-CoV-2 viral copies this assay can detect is 250  copies / mL. A negative result does not preclude SARS-CoV-2 infection  and should not be used as the sole basis for treatment or other  patient management decisions.  A negative result may occur with  improper specimen collection / handling, submission of specimen other  than nasopharyngeal swab, presence of viral mutation(s) within the  areas targeted by this assay, and inadequate number of viral copies  (<250 copies / mL). A negative result must be combined with clinical  observations, patient history, and epidemiological information. If result is POSITIVE SARS-CoV-2 target nucleic acids are DETECTED. The SARS-CoV-2 RNA is generally detectable in upper and lower  respiratory specimens dur ing the acute phase of infection.  Positive  results are indicative of active infection with SARS-CoV-2.  Clinical  correlation with patient history and other diagnostic information is  necessary to determine patient infection status.  Positive results do  not rule out bacterial infection or co-infection with other viruses. If result is PRESUMPTIVE POSTIVE SARS-CoV-2 nucleic acids MAY BE PRESENT.   A presumptive positive result was obtained on the submitted specimen  and confirmed on repeat testing.  While 2019 novel coronavirus  (SARS-CoV-2) nucleic acids may be present in the submitted sample  additional confirmatory testing may be necessary for epidemiological  and / or clinical  management purposes  to differentiate between  SARS-CoV-2 and other Sarbecovirus currently known to infect humans.  If clinically indicated additional testing with an alternate test  methodology [phone]) is advised. The SARS-CoV-2 RNA is generally  detectable in upper and lower respiratory sp ecimens during the acute  phase of infection. The expected result is Negative. Fact Sheet for Patients:  StrictlyIdeas.no Fact Sheet for Healthcare Providers: BankingDealers.co.za This test is not yet approved or cleared by the [place] FDA and has been authorized for detection and/or diagnosis of SARS-CoV-2 by FDA under an Emergency Use Authorization (EUA).  This EUA will remain in effect (meaning this test can be used) for the duration of the COVID-19 declaration under Section 564(b)(1) of the Act, 21 U.S.C. section 360bbb-3(b)(1), unless the authorization is terminated or revoked sooner. Performed at [facility], [place] [address] [place], [place] [address]     Studies/Results: Dg Chest 2 View  Result Date: 12/12/2018 CLINICAL DATA:  Sickle cell patient.  Pain crisis. EXAM: CHEST - 2 VIEW COMPARISON:  Chest x-ray 12/10/2018 and lumbar spine 09/29/2017 FINDINGS: Right IJ Port-A-Cath unchanged. Lungs are adequately inflated without focal airspace consolidation or effusion. Cardiomediastinal silhouette and remainder of the exam is unchanged. IMPRESSION: No active cardiopulmonary disease. Electronically Signed   By: Marin Olp M.D.   On: 12/12/2018 10:38   Ct Angio Chest Pe W/cm &/or Wo Cm  Result Date: 12/10/2018 CLINICAL DATA:  PE suspected EXAM: CT ANGIOGRAPHY CHEST WITH CONTRAST TECHNIQUE: Multidetector CT imaging of the chest was performed using the standard protocol during bolus administration of intravenous contrast. Multiplanar CT image reconstructions and MIPs were obtained to evaluate the vascular anatomy. CONTRAST:   129mL OMNIPAQUE IOHEXOL 350 MG/ML SOLN COMPARISON:  02/27/2017 FINDINGS: Cardiovascular: Satisfactory opacification of the pulmonary arteries to the segmental level. No evidence of pulmonary embolism. Mild cardiomegaly. No pericardial effusion. Right chest port catheter. Venous collaterals about the chest wall. Mediastinum/Nodes: No enlarged mediastinal, hilar, or axillary lymph nodes. Thyroid gland, trachea, and esophagus demonstrate no significant findings. Lungs/Pleura: Lungs are clear. Stable, benign small pulmonary nodule of the left lower lobe (series 12, image 56). No pleural effusion or pneumothorax. Upper Abdomen: No acute abnormality.  Partial splenic involution. Musculoskeletal: No chest wall abnormality. H-shaped vertebral body endplate deformities. Review of the MIP images confirms the above findings. IMPRESSION: 1.  Negative examination for pulmonary embolism. 2.  Splenic and osseous stigmata of sickle cell disease. Electronically Signed   By: Eddie Candle M.D.   On: 12/10/2018 16:13    Medications: Scheduled Meds: . enoxaparin (LOVENOX) injection  40 mg Subcutaneous Q24H  . folic acid  1 mg Oral Daily  . gabapentin  300 mg Oral TID  . HYDROmorphone   Intravenous Q4H  . hydroxyurea  500 mg Oral BID  . ketorolac  15 mg Intravenous Q6H  . senna-docusate  1 tablet Oral BID   Continuous Infusions: . cefTRIAXone (ROCEPHIN)  IV 1 g (12/12/18 1035)  . dextrose 5 % and 0.45% NaCl 50 mL/hr at 12/12/18 0400   PRN Meds:.acetaminophen, diphenhydrAMINE **OR** diphenhydrAMINE, HYDROmorphone (DILAUDID) injection, iohexol, naloxone **AND** sodium chloride flush, ondansetron, polyethylene glycol  Consultants:  None  Procedures:  Chest xray  Antibiotics:  Rocephin 1 g daily  Assessment/Plan: Active Problems:   Sickle cell pain crisis ([name])   Anemia of chronic disease   Leukocytosis   Chest pain, atypical   Acute bilateral low back pain without sciatica   Sickle cell disease with  pain crisis:  Continue custom dose PCA with settings of 0.5 mg, 10-minute lockout, and 2 mg/h. Discontinue Dilaudid 1 mg IV every 6 hours Continue IV fluids, D5 0.45% saline at 50 mL/h IV Toradol 15 mg every 6 hours Reevaluate pain scale regularly 2 L supplemental oxygen as needed  Leukocytosis: WBCs increased to 23.6.  Patient febrile overnight.  Chest x-ray shows no acute cardiopulmonary process.  Urinalysis, urine culture, and blood cultures pending. Repeat CBC in a.m.  Possible systemic inflammatory response syndrome: Patient febrile overnight, WBCs increased to 23.6.  Initiate broad-spectrum antibiotics. Repeat CBC in a.m.  Sickle cell anemia: Hemoglobin 6.7, below patient's baseline.  Patient does not meet threshold for blood transfusion on today.  Patient typically not transfused unless hemoglobin is less than 6.0 g/dL. CBC in a.m.  Code Status: Full Code Family Communication: N/A Disposition Plan: Not yet ready for discharge  Esiquio Boesen Al Decant  APRN, MSN, FNP-C [facility] [place], [place]  [address] [phone]  If 5PM-7AM, please contact night-coverage.  12/12/2018, 12:29 PM  LOS: 2 days

## 2018-12-13 DIAGNOSIS — R509 Fever, unspecified: Secondary | ICD-10-CM

## 2018-12-13 LAB — URINALYSIS, COMPLETE (UACMP) WITH MICROSCOPIC
Bacteria, UA: NONE SEEN
Bilirubin Urine: NEGATIVE
Glucose, UA: NEGATIVE mg/dL
Hgb urine dipstick: NEGATIVE
Ketones, ur: NEGATIVE mg/dL
Leukocytes,Ua: NEGATIVE
Nitrite: NEGATIVE
Protein, ur: NEGATIVE mg/dL
Specific Gravity, Urine: 1.011 (ref 1.005–1.030)
pH: 6 (ref 5.0–8.0)

## 2018-12-13 LAB — CBC
HCT: 19.1 % — ABNORMAL LOW (ref 36.0–46.0)
Hemoglobin: 6.4 g/dL — CL (ref 12.0–15.0)
MCH: 32.8 pg (ref 26.0–34.0)
MCHC: 33.5 g/dL (ref 30.0–36.0)
MCV: 97.9 fL (ref 80.0–100.0)
Platelets: 356 10*3/uL (ref 150–400)
RBC: 1.95 MIL/uL — ABNORMAL LOW (ref 3.87–5.11)
RDW: 18.6 % — ABNORMAL HIGH (ref 11.5–15.5)
WBC: 20.1 10*3/uL — ABNORMAL HIGH (ref 4.0–10.5)
nRBC: 7.3 % — ABNORMAL HIGH (ref 0.0–0.2)

## 2018-12-13 LAB — BASIC METABOLIC PANEL
Anion gap: 9 (ref 5–15)
BUN: 9 mg/dL (ref 6–20)
CO2: 26 mmol/L (ref 22–32)
Calcium: 8.1 mg/dL — ABNORMAL LOW (ref 8.9–10.3)
Chloride: 102 mmol/L (ref 98–111)
Creatinine, Ser: 0.59 mg/dL (ref 0.44–1.00)
GFR calc Af Amer: >60 mL/min (ref >60)
GFR calc non Af Amer: >60 mL/min (ref >60)
Glucose, Bld: 112 mg/dL — ABNORMAL HIGH (ref 70–99)
Potassium: 3.4 mmol/L — ABNORMAL LOW (ref 3.5–5.1)
Sodium: 137 mmol/L (ref 135–145)

## 2018-12-13 MED ORDER — OXYCODONE-ACETAMINOPHEN 5-325 MG PO TABS
1.0000 | ORAL_TABLET | ORAL | Status: DC | PRN
  Administered 2018-12-13 – 2018-12-17 (×6): 1 via ORAL
  Filled 2018-12-13 (×6): qty 1

## 2018-12-13 MED ORDER — SODIUM CHLORIDE 0.9 % IV BOLUS
500.0000 mL | Freq: Once | INTRAVENOUS | Status: AC
  Administered 2018-12-13: 500 mL via INTRAVENOUS

## 2018-12-13 MED ORDER — HYDROMORPHONE 1 MG/ML IV SOLN
INTRAVENOUS | Status: DC
  Administered 2018-12-13: 3.2 mg via INTRAVENOUS
  Administered 2018-12-13: 4 mg via INTRAVENOUS
  Administered 2018-12-13: 3 mg via INTRAVENOUS
  Administered 2018-12-14: 4 mg via INTRAVENOUS
  Administered 2018-12-14: 30 mg via INTRAVENOUS
  Administered 2018-12-14 (×2): 2.7 mg via INTRAVENOUS
  Administered 2018-12-14 – 2018-12-15 (×2): 0.3 mg via INTRAVENOUS
  Administered 2018-12-15: 4.5 mg via INTRAVENOUS
  Administered 2018-12-15: 2.1 mg via INTRAVENOUS
  Administered 2018-12-16: 4.5 mg via INTRAVENOUS
  Administered 2018-12-16: 4 mg via INTRAVENOUS
  Administered 2018-12-16: 30 mg via INTRAVENOUS
  Administered 2018-12-16: 8 mg via INTRAVENOUS
  Administered 2018-12-16: 7.2 mg via INTRAVENOUS
  Administered 2018-12-16: 1.5 mg via INTRAVENOUS
  Administered 2018-12-17: 2.4 mg via INTRAVENOUS
  Administered 2018-12-17: 4.2 mg via INTRAVENOUS
  Administered 2018-12-17: 3.6 mg via INTRAVENOUS
  Filled 2018-12-13 (×2): qty 30

## 2018-12-13 NOTE — Care Management Important Message (Signed)
Important Message  Patient Details IM Letter given to Servando Snare SW to present to the Patient Name: [name] MRN: [id number] Date of Birth: [birth year]   Medicare Important Message Given:  Yes     Kerin Salen 12/13/2018, 10:51 AM

## 2018-12-13 NOTE — Progress Notes (Signed)
Subjective: [name], a 28 year old female with a medical history significant for sickle cell disease, type SS, chronic pain syndrome, history of polysubstance abuse, history of anemia of chronic disease, and history of trichotillomania was admitted for sickle cell pain crisis.  Patient's pain has improved some overnight.  Pain intensity is 7-8/10.  She states that she cannot manage at home at current pain level. Pain is primarily to low back, and lower extremities characterized as constant and throbbing.  Patient is afebrile and maintaining oxygen saturation above 90%.  Patient denies headache, chest pain, shortness of breath, dysuria, nausea, vomiting, or diarrhea.  Objective:  Vital signs in last 24 hours:  Vitals:   12/13/18 0655 12/13/18 0800 12/13/18 0943 12/13/18 1245  BP: 101/64  117/81   Pulse: (!) 107  (!) 124   Resp: 18 16 20 13   Temp: 99.3 F (37.4 C)  98.9 F (37.2 C)   TempSrc: Oral  Oral   SpO2: 92% 98% 95% 93%  Weight:      Height:        Intake/Output from previous day:   Intake/Output Summary (Last 24 hours) at 12/13/2018 1336 Last data filed at 12/13/2018 0900 Gross per 24 hour  Intake 1617.47 ml  Output 1000 ml  Net 617.47 ml    Physical Exam: General: Alert, awake, oriented x3, in no acute distress.  HEENT: [place]/AT PEERL, EOMI Neck: Trachea midline,  no masses, no thyromegal,y no JVD, no carotid bruit OROPHARYNX:  Moist, No exudate/ erythema/lesions.  Heart: Regular rate and rhythm, without murmurs, rubs, gallops, PMI non-displaced, no heaves or thrills on palpation.  Lungs: Clear to auscultation, no wheezing or rhonchi noted. No increased vocal fremitus resonant to percussion  Abdomen: Soft, nontender, nondistended, positive bowel sounds, no masses no hepatosplenomegaly noted..  Neuro: No focal neurological deficits noted cranial nerves II through XII grossly intact. DTRs 2+ bilaterally upper and lower extremities. Strength 5 out of 5 in  bilateral upper and lower extremities. Musculoskeletal: No warm swelling or erythema around joints, no spinal tenderness noted. Psychiatric: Patient alert and oriented x3, good insight and cognition, good recent to remote recall. Lymph node survey: No cervical axillary or inguinal lymphadenopathy noted.  Lab Results:  Basic Metabolic Panel:    Component Value Date/Time   NA 137 12/13/2018 1016   NA 140 11/06/2018 1456   NA 139 05/06/2012 1645   K 3.4 (L) 12/13/2018 1016   K 3.9 05/06/2012 1645   CL 102 12/13/2018 1016   CL 110 (H) 05/06/2012 1645   CO2 26 12/13/2018 1016   CO2 22 05/06/2012 1645   BUN 9 12/13/2018 1016   BUN 10 11/06/2018 1456   BUN 9 05/06/2012 1645   CREATININE 0.59 12/13/2018 1016   CREATININE 0.43 (L) 07/06/2015 1356   GLUCOSE 112 (H) 12/13/2018 1016   GLUCOSE 84 05/06/2012 1645   CALCIUM 8.1 (L) 12/13/2018 1016   CALCIUM 8.3 (L) 05/06/2012 1645   CBC:    Component Value Date/Time   WBC 20.1 (H) 12/13/2018 1016   HGB 6.4 (LL) 12/13/2018 1016   HGB 9.1 (L) 11/06/2018 1456   HCT 19.1 (L) 12/13/2018 1016   HCT 27.0 (L) 11/06/2018 1456   PLT 356 12/13/2018 1016   PLT 549 (H) 11/06/2018 1456   MCV 97.9 12/13/2018 1016   MCV 97 11/06/2018 1456   MCV 94 05/11/2012 0721   NEUTROABS 14.0 (H) 12/11/2018 0615   NEUTROABS 6.0 11/06/2018 1456   NEUTROABS 10.9 (H) 05/10/2012 9163  LYMPHSABS 2.6 12/11/2018 0615   LYMPHSABS 1.9 11/06/2018 1456   LYMPHSABS 3.5 05/10/2012 0552   MONOABS 1.6 (H) 12/11/2018 0615   MONOABS 1.7 (H) 05/10/2012 0552   EOSABS 0.5 12/11/2018 0615   EOSABS 0.2 11/06/2018 1456   EOSABS 0.8 (H) 05/10/2012 0552   BASOSABS 0.1 12/11/2018 0615   BASOSABS 0.1 11/06/2018 1456   BASOSABS 0.1 05/10/2012 0552    Recent Results (from the past 240 hour(s))  SARS Coronavirus 2 (CEPHEID - Performed in [place] hospital lab), Hosp Order     Status: None   Collection Time: 12/10/18  1:55 PM   Specimen: Nasopharyngeal Swab  Result Value Ref  Range Status   SARS Coronavirus 2 NEGATIVE NEGATIVE Final    Comment: (NOTE) If result is NEGATIVE SARS-CoV-2 target nucleic acids are NOT DETECTED. The SARS-CoV-2 RNA is generally detectable in upper and lower  respiratory specimens during the acute phase of infection. The lowest  concentration of SARS-CoV-2 viral copies this assay can detect is 250  copies / mL. A negative result does not preclude SARS-CoV-2 infection  and should not be used as the sole basis for treatment or other  patient management decisions.  A negative result may occur with  improper specimen collection / handling, submission of specimen other  than nasopharyngeal swab, presence of viral mutation(s) within the  areas targeted by this assay, and inadequate number of viral copies  (<250 copies / mL). A negative result must be combined with clinical  observations, patient history, and epidemiological information. If result is POSITIVE SARS-CoV-2 target nucleic acids are DETECTED. The SARS-CoV-2 RNA is generally detectable in upper and lower  respiratory specimens dur ing the acute phase of infection.  Positive  results are indicative of active infection with SARS-CoV-2.  Clinical  correlation with patient history and other diagnostic information is  necessary to determine patient infection status.  Positive results do  not rule out bacterial infection or co-infection with other viruses. If result is PRESUMPTIVE POSTIVE SARS-CoV-2 nucleic acids MAY BE PRESENT.   A presumptive positive result was obtained on the submitted specimen  and confirmed on repeat testing.  While 2019 novel coronavirus  (SARS-CoV-2) nucleic acids may be present in the submitted sample  additional confirmatory testing may be necessary for epidemiological  and / or clinical management purposes  to differentiate between  SARS-CoV-2 and other Sarbecovirus currently known to infect humans.  If clinically indicated additional testing with an  alternate test  methodology [phone]) is advised. The SARS-CoV-2 RNA is generally  detectable in upper and lower respiratory sp ecimens during the acute  phase of infection. The expected result is Negative. Fact Sheet for Patients:  StrictlyIdeas.no Fact Sheet for Healthcare Providers: BankingDealers.co.za This test is not yet approved or cleared by the [place] FDA and has been authorized for detection and/or diagnosis of SARS-CoV-2 by FDA under an Emergency Use Authorization (EUA).  This EUA will remain in effect (meaning this test can be used) for the duration of the COVID-19 declaration under Section 564(b)(1) of the Act, 21 U.S.C. section 360bbb-3(b)(1), unless the authorization is terminated or revoked sooner. Performed at [facility], [place] [address], [place], [place] [address]   Culture, blood (Routine X 2) w Reflex to ID Panel     Status: None (Preliminary result)   Collection Time: 12/12/18 10:39 AM   Specimen: BLOOD  Result Value Ref Range Status   Specimen Description   Final    BLOOD LEFT ANTECUBITAL Performed  at [facility], [place] [address] [place], [place] [address]    Special Requests   Final    BOTTLES DRAWN AEROBIC ONLY Blood Culture results may not be optimal due to an inadequate volume of blood received in culture bottles Performed at [facility] [address] [place], [place] [address]    Culture   Final    NO GROWTH < 24 HOURS Performed at [facility] [address], [place], [place] [address]    Report Status PENDING  Incomplete  Culture, blood (Routine X 2) w Reflex to ID Panel     Status: None (Preliminary result)   Collection Time: 12/12/18 12:58 PM   Specimen: BLOOD  Result Value Ref Range Status   Specimen Description   Final    BLOOD LEFT ARM Performed at [facility] [address], [place], [place]  [address]    Special Requests   Final    BOTTLES DRAWN AEROBIC ONLY Blood Culture results may not be optimal due to an inadequate volume of blood received in culture bottles Performed at [facility] [address] [place], [place] [address]    Culture   Final    NO GROWTH < 24 HOURS Performed at [facility] [address], [place], [place] [address]    Report Status PENDING  Incomplete    Studies/Results: Dg Chest 2 View  Result Date: 12/12/2018 CLINICAL DATA:  Sickle cell patient.  Pain crisis. EXAM: CHEST - 2 VIEW COMPARISON:  Chest x-ray 12/10/2018 and lumbar spine 09/29/2017 FINDINGS: Right IJ Port-A-Cath unchanged. Lungs are adequately inflated without focal airspace consolidation or effusion. Cardiomediastinal silhouette and remainder of the exam is unchanged. IMPRESSION: No active cardiopulmonary disease. Electronically Signed   By: Marin Olp M.D.   On: 12/12/2018 10:38    Medications: Scheduled Meds: . enoxaparin (LOVENOX) injection  40 mg Subcutaneous Q24H  . folic acid  1 mg Oral Daily  . gabapentin  300 mg Oral TID  . HYDROmorphone   Intravenous Q4H  . hydroxyurea  500 mg Oral BID  . ketorolac  15 mg Intravenous Q6H  . senna-docusate  1 tablet Oral BID   Continuous Infusions: . cefTRIAXone (ROCEPHIN)  IV 1 g (12/13/18 1006)  . dextrose 5 % and 0.45% NaCl 50 mL/hr at 12/13/18 0542   PRN Meds:.acetaminophen, diphenhydrAMINE, iohexol, naloxone **AND** sodium chloride flush, ondansetron (ZOFRAN) IV, oxyCODONE-acetaminophen, polyethylene glycol  Antibiotics:  Rocephin 1 g daily  Assessment/Plan: Active Problems:   Sickle cell pain crisis ([name])   Anemia of chronic disease   Leukocytosis   Fever   Chest pain, atypical   Acute bilateral low back pain without sciatica   Systemic inflammatory response syndrome ([name])  Sickle cell disease with pain crisis: Weaning Dilaudid PCA Continue [facility] at [facility] IV Toradol 15 mg every 6  hours Reevaluate pain scale regularly 2 L supplemental oxygen as needed  Leukocytosis: WBCs decreased from previous.  Patient afebrile overnight.  Broad-spectrum antibiotics continued.  Blood cultures negative so far.  Chest x-ray shows no acute cardiopulmonary process. Continue to follow CBC.  SIRS: Patient afebrile overnight.  WBCs decreased to 20.1.  Continue broad-spectrum antibiotics. Follow CBC  Sickle cell anemia: Hemoglobin 6.4, below patient's baseline.  Continue to monitor closely.  Patient does not meet threshold for blood transfusion today.  Patient typically not transfused unless hemoglobin is less than 6.0 g/dL. Repeat CBC in a.m.   Code Status: Full  Code Family Communication: N/A Disposition Plan: Not yet ready for discharge  [place], MSN, FNP-C Patient [place]  [place], [place] [address] [phone] you watch your mom via no way If 7PM-7AM, please contact night-coverage.  12/13/2018, 1:36 PM  LOS: 3 days

## 2018-12-13 NOTE — Progress Notes (Signed)
T: 102.1 BP 90/58 P: 121 RR: 15 Tylenol given on-call Schorr, NP aware with new order for 500 cc bolus MEWS Guidelines - (patients age 26 and over)  Red - At High Risk for Deterioration Yellow - At risk for Deterioration  1. Go to room and assess patient 2. Validate data. Is this patient's baseline? If data confirmed: 3. Is this an acute change? 4. Administer prn meds/treatments as ordered. 5. Note Sepsis score 6. Review goals of care 7. Sports coach, RRT nurse and Provider. 8. Ask Provider to come to bedside.  9. Document patient condition/interventions/response. 10. Increase frequency of vital signs and focused assessments to at least q15 minutes x 4, then q30 minutes x2. - If stable, then q1h x3, then q4h x3 and then q8h or dept. routine. - If unstable, contact Provider & RRT nurse. Prepare for possible transfer. 11. Add entry in progress notes using the smart phrase ".MEWS". 1. Go to room and assess patient 2. Validate data. Is this patient's baseline? If data confirmed: 3. Is this an acute change? 4. Administer prn meds/treatments as ordered? 5. Note Sepsis score 6. Review goals of care 7. Sports coach and Provider 8. Call RRT nurse as needed. 9. Document patient condition/interventions/response. 10. Increase frequency of vital signs and focused assessments to at least q2h x2. - If stable, then q4h x2 and then q8h or dept. routine. - If unstable, contact Provider & RRT nurse. Prepare for possible transfer. 11. Add entry in progress notes using the smart phrase ".MEWS".  Green - Likely stable [place] - Comfort Care Only  1. Continue routine/ordered monitoring.  2. Review goals of care. 1. Continue routine/ordered monitoring. 2. Review goals of care.

## 2018-12-13 NOTE — Progress Notes (Signed)
T: 99.3 BP 101/64 P: 107 RR 18 MEWS Guidelines - (patients age 27 and over)  Red - At High Risk for Deterioration Yellow - At risk for Deterioration  1. Go to room and assess patient 2. Validate data. Is this patient's baseline? If data confirmed: 3. Is this an acute change? 4. Administer prn meds/treatments as ordered. 5. Note Sepsis score 6. Review goals of care 7. Sports coach, RRT nurse and Provider. 8. Ask Provider to come to bedside.  9. Document patient condition/interventions/response. 10. Increase frequency of vital signs and focused assessments to at least q15 minutes x 4, then q30 minutes x2. - If stable, then q1h x3, then q4h x3 and then q8h or dept. routine. - If unstable, contact Provider & RRT nurse. Prepare for possible transfer. 11. Add entry in progress notes using the smart phrase ".MEWS". 1. Go to room and assess patient 2. Validate data. Is this patient's baseline? If data confirmed: 3. Is this an acute change? 4. Administer prn meds/treatments as ordered? 5. Note Sepsis score 6. Review goals of care 7. Sports coach and Provider 8. Call RRT nurse as needed. 9. Document patient condition/interventions/response. 10. Increase frequency of vital signs and focused assessments to at least q2h x2. - If stable, then q4h x2 and then q8h or dept. routine. - If unstable, contact Provider & RRT nurse. Prepare for possible transfer. 11. Add entry in progress notes using the smart phrase ".MEWS".  Green - Likely stable [place] - Comfort Care Only  1. Continue routine/ordered monitoring.  2. Review goals of care. 1. Continue routine/ordered monitoring. 2. Review goals of care.

## 2018-12-14 LAB — CBC
HCT: 16.5 % — ABNORMAL LOW (ref 36.0–46.0)
Hemoglobin: 5.5 g/dL — CL (ref 12.0–15.0)
MCH: 32.7 pg (ref 26.0–34.0)
MCHC: 33.3 g/dL (ref 30.0–36.0)
MCV: 98.2 fL (ref 80.0–100.0)
Platelets: 338 10*3/uL (ref 150–400)
RBC: 1.68 MIL/uL — ABNORMAL LOW (ref 3.87–5.11)
RDW: 18.4 % — ABNORMAL HIGH (ref 11.5–15.5)
WBC: 16.1 10*3/uL — ABNORMAL HIGH (ref 4.0–10.5)
nRBC: 21.1 % — ABNORMAL HIGH (ref 0.0–0.2)

## 2018-12-14 LAB — PREPARE RBC (CROSSMATCH)

## 2018-12-14 LAB — URINE CULTURE: Culture: NO GROWTH

## 2018-12-14 MED ORDER — ACETAMINOPHEN 325 MG PO TABS
650.0000 mg | ORAL_TABLET | Freq: Once | ORAL | Status: AC
  Administered 2018-12-14: 17:00:00 650 mg via ORAL
  Filled 2018-12-14: qty 2

## 2018-12-14 MED ORDER — DIPHENHYDRAMINE HCL 25 MG PO CAPS
25.0000 mg | ORAL_CAPSULE | Freq: Once | ORAL | Status: AC
  Administered 2018-12-14: 17:00:00 25 mg via ORAL
  Filled 2018-12-14: qty 1

## 2018-12-14 MED ORDER — SODIUM CHLORIDE 0.9% IV SOLUTION
Freq: Once | INTRAVENOUS | Status: AC
  Administered 2018-12-14: 17:00:00 via INTRAVENOUS

## 2018-12-14 NOTE — Progress Notes (Signed)
Subjective: [name], a 28 year old female with a medical history significant for sickle cell disease, type SS, chronic pain syndrome, history of polysubstance abuse, history of anemia of chronic disease was admitted for sickle cell pain crisis.  Patient states that her pain is improved overnight.  Pain intensity is 5/primarily to bilateral lower extremities.  Pain is characterized as intermittent and aching.  Today, hemoglobin is 5.5.  She denies dizziness, shortness of breath, headache, paresthesias.  Patient is afebrile and maintaining oxygen saturation at 95% on RA.  Objective:  Vital signs in last 24 hours:  Vitals:   12/14/18 0406 12/14/18 0536 12/14/18 0849 12/14/18 0908  BP:  117/73    Pulse:  (!) 102    Resp: 15 16 17 17   Temp:  98.7 F (37.1 C)    TempSrc:      SpO2: 93% 94% 95% 95%  Weight:      Height:        Intake/Output from previous day:   Intake/Output Summary (Last 24 hours) at 12/14/2018 1257 Last data filed at 12/14/2018 0954 Gross per 24 hour  Intake 471.86 ml  Output 900 ml  Net -428.14 ml    Physical Exam: General: Alert, awake, oriented x3, in no acute distress.  HEENT: [place]/AT PEERL, EOMI Neck: Trachea midline,  no masses, no thyromegal,y no JVD, no carotid bruit OROPHARYNX:  Moist, No exudate/ erythema/lesions.  Heart: Regular rate and rhythm, without murmurs, rubs, gallops, PMI non-displaced, no heaves or thrills on palpation.  Lungs: Clear to auscultation, no wheezing or rhonchi noted. No increased vocal fremitus resonant to percussion  Abdomen: Soft, nontender, nondistended, positive bowel sounds, no masses no hepatosplenomegaly noted..  Neuro: No focal neurological deficits noted cranial nerves II through XII grossly intact. DTRs 2+ bilaterally upper and lower extremities. Strength 5 out of 5 in bilateral upper and lower extremities. Musculoskeletal: No warm swelling or erythema around joints, no spinal tenderness noted. Psychiatric:  Patient alert and oriented x3, good insight and cognition, good recent to remote recall. Lymph node survey: No cervical axillary or inguinal lymphadenopathy noted.  Lab Results:  Basic Metabolic Panel:    Component Value Date/Time   NA 137 12/13/2018 1016   NA 140 11/06/2018 1456   NA 139 05/06/2012 1645   K 3.4 (L) 12/13/2018 1016   K 3.9 05/06/2012 1645   CL 102 12/13/2018 1016   CL 110 (H) 05/06/2012 1645   CO2 26 12/13/2018 1016   CO2 22 05/06/2012 1645   BUN 9 12/13/2018 1016   BUN 10 11/06/2018 1456   BUN 9 05/06/2012 1645   CREATININE 0.59 12/13/2018 1016   CREATININE 0.43 (L) 07/06/2015 1356   GLUCOSE 112 (H) 12/13/2018 1016   GLUCOSE 84 05/06/2012 1645   CALCIUM 8.1 (L) 12/13/2018 1016   CALCIUM 8.3 (L) 05/06/2012 1645   CBC:    Component Value Date/Time   WBC 16.1 (H) 12/14/2018 0921   HGB 5.5 (LL) 12/14/2018 0921   HGB 9.1 (L) 11/06/2018 1456   HCT 16.5 (L) 12/14/2018 0921   HCT 27.0 (L) 11/06/2018 1456   PLT 338 12/14/2018 0921   PLT 549 (H) 11/06/2018 1456   MCV 98.2 12/14/2018 0921   MCV 97 11/06/2018 1456   MCV 94 05/11/2012 0721   NEUTROABS 14.0 (H) 12/11/2018 0615   NEUTROABS 6.0 11/06/2018 1456   NEUTROABS 10.9 (H) 05/10/2012 0552   LYMPHSABS 2.6 12/11/2018 0615   LYMPHSABS 1.9 11/06/2018 1456   LYMPHSABS 3.5 05/10/2012 0552   MONOABS  1.6 (H) 12/11/2018 0615   MONOABS 1.7 (H) 05/10/2012 0552   EOSABS 0.5 12/11/2018 0615   EOSABS 0.2 11/06/2018 1456   EOSABS 0.8 (H) 05/10/2012 0552   BASOSABS 0.1 12/11/2018 0615   BASOSABS 0.1 11/06/2018 1456   BASOSABS 0.1 05/10/2012 0552    Recent Results (from the past 240 hour(s))  SARS Coronavirus 2 (CEPHEID - Performed in [facility] hospital lab), Hosp Order     Status: None   Collection Time: 12/10/18  1:55 PM   Specimen: Nasopharyngeal Swab  Result Value Ref Range Status   SARS Coronavirus 2 NEGATIVE NEGATIVE Final    Comment: (NOTE) If result is NEGATIVE SARS-CoV-2 target nucleic acids are NOT  DETECTED. The SARS-CoV-2 RNA is generally detectable in upper and lower  respiratory specimens during the acute phase of infection. The lowest  concentration of SARS-CoV-2 viral copies this assay can detect is 250  copies / mL. A negative result does not preclude SARS-CoV-2 infection  and should not be used as the sole basis for treatment or other  patient management decisions.  A negative result may occur with  improper specimen collection / handling, submission of specimen other  than nasopharyngeal swab, presence of viral mutation(s) within the  areas targeted by this assay, and inadequate number of viral copies  (<250 copies / mL). A negative result must be combined with clinical  observations, patient history, and epidemiological information. If result is POSITIVE SARS-CoV-2 target nucleic acids are DETECTED. The SARS-CoV-2 RNA is generally detectable in upper and lower  respiratory specimens dur ing the acute phase of infection.  Positive  results are indicative of active infection with SARS-CoV-2.  Clinical  correlation with patient history and other diagnostic information is  necessary to determine patient infection status.  Positive results do  not rule out bacterial infection or co-infection with other viruses. If result is PRESUMPTIVE POSTIVE SARS-CoV-2 nucleic acids MAY BE PRESENT.   A presumptive positive result was obtained on the submitted specimen  and confirmed on repeat testing.  While 2019 novel coronavirus  (SARS-CoV-2) nucleic acids may be present in the submitted sample  additional confirmatory testing may be necessary for epidemiological  and / or clinical management purposes  to differentiate between  SARS-CoV-2 and other Sarbecovirus currently known to infect humans.  If clinically indicated additional testing with an alternate test  methodology [phone]) is advised. The SARS-CoV-2 RNA is generally  detectable in upper and lower respiratory sp ecimens during  the acute  phase of infection. The expected result is Negative. Fact Sheet for Patients:  StrictlyIdeas.no Fact Sheet for Healthcare Providers: BankingDealers.co.za This test is not yet approved or cleared by the [place] FDA and has been authorized for detection and/or diagnosis of SARS-CoV-2 by FDA under an Emergency Use Authorization (EUA).  This EUA will remain in effect (meaning this test can be used) for the duration of the COVID-19 declaration under Section 564(b)(1) of the Act, 21 U.S.C. section 360bbb-3(b)(1), unless the authorization is terminated or revoked sooner. Performed at [facility], [place] [address] [place], [place] [address]   Culture, blood (Routine X 2) w Reflex to ID Panel     Status: None (Preliminary result)   Collection Time: 12/12/18 10:39 AM   Specimen: BLOOD  Result Value Ref Range Status   Specimen Description   Final    BLOOD LEFT ANTECUBITAL Performed at [facility] [address], [place], [place] [address]    Special Requests  Final    BOTTLES DRAWN AEROBIC ONLY Blood Culture results may not be optimal due to an inadequate volume of blood received in culture bottles Performed at [facility], [place] [address], [place], [place] [address]    Culture   Final    NO GROWTH < 24 HOURS Performed at [place] [facility] [address], [place], [place] [address]    Report Status PENDING  Incomplete  Culture, blood (Routine X 2) w Reflex to ID Panel     Status: None (Preliminary result)   Collection Time: 12/12/18 12:58 PM   Specimen: BLOOD  Result Value Ref Range Status   Specimen Description   Final    BLOOD LEFT ARM Performed at [facility] [address], [place], [place] [address]    Special Requests   Final    BOTTLES DRAWN AEROBIC ONLY Blood Culture results may not be optimal due to an inadequate volume of blood  received in culture bottles Performed at [facility] [address], [place], [place] [address]    Culture   Final    NO GROWTH < 24 HOURS Performed at [facility] [address], [place], [place] [address]    Report Status PENDING  Incomplete  Urine Culture     Status: None   Collection Time: 12/13/18  6:00 AM   Specimen: Urine, Random  Result Value Ref Range Status   Specimen Description   Final    URINE, RANDOM Performed at [facility] [address] [place], [place] [address]    Special Requests   Final    NONE Performed at [facility], [place] [address], [place], [place] [address]    Culture   Final    NO GROWTH Performed at [facility], [place] [address] [place], [place] [address]    Report Status 12/14/2018 FINAL  Final    Studies/Results: No results found.  Medications: Scheduled Meds: . sodium chloride   Intravenous Once  . acetaminophen  650 mg Oral Once  . diphenhydrAMINE  25 mg Oral Once  . enoxaparin (LOVENOX) injection  40 mg Subcutaneous Q24H  . folic acid  1 mg Oral Daily  . gabapentin  300 mg Oral TID  . HYDROmorphone   Intravenous Q4H  . hydroxyurea  500 mg Oral BID  . ketorolac  15 mg Intravenous Q6H  . senna-docusate  1 tablet Oral BID   Continuous Infusions: . cefTRIAXone (ROCEPHIN)  IV 1 g (12/14/18 0851)  . dextrose 5 % and 0.45% NaCl 20 mL/hr at 12/13/18 1726   PRN Meds:.acetaminophen, diphenhydrAMINE, iohexol, naloxone **AND** sodium chloride flush, ondansetron (ZOFRAN) IV, oxyCODONE-acetaminophen, polyethylene glycol   Procedures:  Transfuse 2 units PRBCs  Antibiotics: Rocephin 1 g daily Assessment/Plan: Active Problems:   Sickle cell pain crisis ([name])   Anemia of chronic disease   Leukocytosis   Fever   Chest pain, atypical   Acute bilateral low back pain without sciatica   Systemic inflammatory response syndrome ([name])  Sickle cell disease with pain  crisis: Weaning PCA Dilaudid Percocet 5-325 mg every 4 hours as needed for breakthrough pain Continue IV fluids at [facility] IV Toradol 15 mg every 6 hours Reevaluate pain scale regularly 2 L supplemental oxygen as needed  Leukocytosis: WBC stable.  Patient afebrile overnight.  Broad-spectrum antibiotics continued.  Blood cultures negative so far.  SIRS: Patient afebrile overnight.  WBCs decreased to 16.1.  Antibiotic to be  discontinued on 12/15/2018.  Anemia of chronic disease: Hemoglobin 5.5.  Transfuse 2 units PRBCs. Follow CBC  Code Status: Full Code Family Communication: N/A Disposition Plan: Not yet ready for discharge   [place], MSN, FNP-C Patient [facility] [address] [place], [place] [address] [phone]  If 5PM-7AM, please contact night-coverage.  12/14/2018, 12:57 PM  LOS: 4 days

## 2018-12-14 NOTE — Progress Notes (Signed)
Pharmacy Brief Note:  Hydroxyurea (Hydrea) hold criteria per P&T Committee:  ANC < 2  Pltc < 80K in sickle-cell patients; < 100K in other patients  Hgb <= 6 in sickle-cell patients; < 8 in other patients  Reticulocytes < 80K when Hgb < 9  Hydroxyurea discontinued for Hgb <6. Per protocol.  Lenis Noon, PharmD 12/14/18 7:43 PM

## 2018-12-15 LAB — HEMOGLOBIN AND HEMATOCRIT, BLOOD
HCT: 20.4 % — ABNORMAL LOW (ref 36.0–46.0)
Hemoglobin: 6.7 g/dL — CL (ref 12.0–15.0)

## 2018-12-15 MED ORDER — DIPHENHYDRAMINE HCL 25 MG PO CAPS
25.0000 mg | ORAL_CAPSULE | Freq: Once | ORAL | Status: AC
  Administered 2018-12-15: 25 mg via ORAL
  Filled 2018-12-15: qty 1

## 2018-12-15 NOTE — Progress Notes (Addendum)
CRITICAL VALUE ALERT  Critical Value: HGB 6.7  Date & Time Notied:  12/15/2018 1007  Provider Notified I spoke with Dr Jonelle Sidle  Orders Received/Actions taken. None . This is her normal hgb per Dr Jonelle Sidle

## 2018-12-15 NOTE — Progress Notes (Signed)
Pt has spiked fevers off and on. At completion of first unit of blood temp 101.4 orally. Tylenol given, will hold off on second unit until temperature normal. Provider [place], notified. Will continue to monitor. Hoyle Barr, RN

## 2018-12-15 NOTE — Progress Notes (Signed)
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  Patient has been of narcotics before almost 9 months.  She is on probation for medication usage and has no prescription at home.  Her goal therefore is to be pain-free so she does not have to take medicine at discharge.  She continues to have pain at 7 out of 10 in her legs and upper arms.  She denied any fever or chills no nausea vomiting or diarrhea.  She is currently on Dilaudid PCA and Tylenol.  Also on oxycodone acetaminophen because she is now nave.  No fever or chills.  Objective: Vital signs in last 24 hours: Temp:  [98.6 F (37 C)-101.4 F (38.6 C)] 98.6 F (37 C) (08/01 0345) Pulse Rate:  [91-114] 91 (08/01 0345) Resp:  [12-20] 16 (08/01 0402) BP: (104-126)/(57-94) 111/94 (08/01 0345) SpO2:  [93 %-97 %] 93 % (08/01 0402) Weight change:  Last BM Date: 12/14/18  Intake/Output from previous day: 07/31 0701 - 08/01 0700 In: 1935.4 [P.O.:360; I.V.:713.6; Blood:650; IV Piggyback:211.9] Out: 2500 [Urine:2500] Intake/Output this shift: Total I/O In: 827 [I.V.:177; Blood:650] Out: 1600 [Urine:1600]  General appearance: alert, cooperative, appears stated age and no distress Neck: no adenopathy, no carotid bruit, no JVD, supple, symmetrical, trachea midline and thyroid not enlarged, symmetric, no tenderness/mass/nodules Back: symmetric, no curvature. ROM normal. No CVA tenderness. Resp: clear to auscultation bilaterally Cardio: regular rate and rhythm, S1, S2 normal, no murmur, click, rub or gallop GI: soft, non-tender; bowel sounds normal; no masses,  no organomegaly Extremities: extremities normal, atraumatic, no cyanosis or edema Pulses: 2+ and symmetric Skin: Skin color, texture, turgor normal. No rashes or lesions Neurologic: Grossly normal  Lab Results: Recent Labs    12/13/18 1016 12/14/18 0921  WBC 20.1* 16.1*  HGB 6.4* 5.5*  HCT 19.1* 16.5*  PLT  356 338   BMET Recent Labs    12/12/18 1039 12/13/18 1016  NA 136 137  K 3.4* 3.4*  CL 101 102  CO2 26 26  GLUCOSE 109* 112*  BUN 7 9  CREATININE 0.56 0.59  CALCIUM 8.3* 8.1*    Studies/Results: No results found.  Medications: I have reviewed the patient's current medications.  Assessment/Plan: A 28 year old female with sickle cell painful crisis.  #1 sickle cell painful crisis: Patient is currently opiate nave.  Continue with the PCA as well as Percocet.  Patient is not allowed to have pain medicine at home.  We will continue current regimen and mobilize patient.  #2 sickle cell anemia: Hemoglobin was 5.5 yesterday.  She appears to be stable.  Continue treatment.  #3 hypokalemia: Appears to have been repleted.  Continue monitoring.  #4 leukocytosis: Secondary to vaso-occlusive crisis.  Continue to monitor.  #5 depression: Continue home regimen.  LOS: 5 days   Gerome Kokesh,LAWAL 12/15/2018, 6:27 AM

## 2018-12-16 LAB — BPAM RBC
Blood Product Expiration Date: 202009042359
Blood Product Expiration Date: 202009072359
ISSUE DATE / TIME: 202007311741
ISSUE DATE / TIME: 202008010126
Unit Type and Rh: 7300
Unit Type and Rh: 7300

## 2018-12-16 LAB — TYPE AND SCREEN
ABO/RH(D): B POS
Antibody Screen: NEGATIVE
Donor AG Type: NEGATIVE
Donor AG Type: NEGATIVE
Unit division: 0
Unit division: 0

## 2018-12-16 MED ORDER — HYDROMORPHONE HCL 1 MG/ML IJ SOLN
2.0000 mg | Freq: Once | INTRAMUSCULAR | Status: AC
  Administered 2018-12-16: 2 mg via INTRAVENOUS

## 2018-12-16 NOTE — Progress Notes (Addendum)
Pt c/o of severe pain . She is requesting a purwic because the pain is so sever that she can't walk.. Then sh falls asleep. She has asked for a bolus dose of dilaudid . I took a verbal order from Dr Jonelle Sidle for dilaudid 2 mg bolus one time..   1030 Writer arrived in room to check her beeping iv pump. The rate was set on 555 ml .the rate is 20 ml .

## 2018-12-16 NOTE — Progress Notes (Signed)
Subjective: Patient is improved but not at baseline.  Hemoglobin was 6.7 yesterday after transfusing 2 units of packed red blood cells.  She is getting Percocet and has completed Toradol.  On Dilaudid PCA.  She cannot go home with narcotics and is trying to get off the narcotics prior to discharge.  Her goal is to have a pain level of 3 or less but is currently at 6 out of 10.  Every time she gets out of bed apparently the pain gets worse she is not able to put her weight on the right leg.  No fever or chills no nausea vomiting or diarrhea.  Nursing staff reported patient getting too sleepy with the pain medications.  She is relatively nave now having been off narcotics for about 9 months.  Objective: Vital signs in last 24 hours: Temp:  [99 F (37.2 C)-99.6 F (37.6 C)] 99.6 F (37.6 C) (08/02 1332) Pulse Rate:  [84-108] 93 (08/02 1822) Resp:  [13-17] 15 (08/02 1849) BP: (102-119)/(57-84) 103/73 (08/02 1822) SpO2:  [91 %-99 %] 95 % (08/02 1849) Weight:  [94.9 kg] 94.9 kg (08/02 0232) Weight change:  Last BM Date: 12/14/18  Intake/Output from previous day: 08/01 0701 - 08/02 0700 In: 240 [P.O.:240] Out: 1800 [Urine:1800] Intake/Output this shift: No intake/output data recorded.  General appearance: alert, cooperative, appears stated age and no distress Neck: no adenopathy, no carotid bruit, no JVD, supple, symmetrical, trachea midline and thyroid not enlarged, symmetric, no tenderness/mass/nodules Back: symmetric, no curvature. ROM normal. No CVA tenderness. Resp: clear to auscultation bilaterally Cardio: regular rate and rhythm, S1, S2 normal, no murmur, click, rub or gallop GI: soft, non-tender; bowel sounds normal; no masses,  no organomegaly Extremities: extremities normal, atraumatic, no cyanosis or edema Pulses: 2+ and symmetric Skin: Skin color, texture, turgor normal. No rashes or lesions Neurologic: Grossly normal  Lab Results: Recent Labs    12/14/18 0921  12/15/18 0623  WBC 16.1*  --   HGB 5.5* 6.7*  HCT 16.5* 20.4*  PLT 338  --    BMET No results for input(s): NA, K, CL, CO2, GLUCOSE, BUN, CREATININE, CALCIUM in the last 72 hours.  Studies/Results: No results found.  Medications: I have reviewed the patient's current medications.  Assessment/Plan: A 28 year old female with sickle cell painful crisis.  #1 sickle cell painful crisis: Patient will continue on the Dilaudid PCA as well as Percocet as needed.  She has completed Toradol.  I will start ibuprofen which she would likely be discharged on.  Hopefully patient will be able to go home tomorrow.  #2 sickle cell anemia: Hemoglobin was 5.5 yesterday.  She appears to be stable.  Continue treatment.  #3 hypokalemia: Appears to have been repleted.  Continue monitoring.  #4 leukocytosis: Secondary to vaso-occlusive crisis.  Continue to monitor.  #5 depression: Continue home regimen.   LOS: 6 days   Nathalia Wismer,LAWAL 12/16/2018, 8:14 PM

## 2018-12-17 DIAGNOSIS — R6510 Systemic inflammatory response syndrome (SIRS) of non-infectious origin without acute organ dysfunction: Secondary | ICD-10-CM

## 2018-12-17 LAB — CULTURE, BLOOD (ROUTINE X 2)
Culture: NO GROWTH
Culture: NO GROWTH

## 2018-12-17 LAB — COMPREHENSIVE METABOLIC PANEL
ALT: 91 U/L — ABNORMAL HIGH (ref 0–44)
AST: 47 U/L — ABNORMAL HIGH (ref 15–41)
Albumin: 3.2 g/dL — ABNORMAL LOW (ref 3.5–5.0)
Alkaline Phosphatase: 151 U/L — ABNORMAL HIGH (ref 38–126)
Anion gap: 8 (ref 5–15)
BUN: 8 mg/dL (ref 6–20)
CO2: 26 mmol/L (ref 22–32)
Calcium: 8.5 mg/dL — ABNORMAL LOW (ref 8.9–10.3)
Chloride: 103 mmol/L (ref 98–111)
Creatinine, Ser: 0.52 mg/dL (ref 0.44–1.00)
GFR calc Af Amer: >60 mL/min (ref >60)
GFR calc non Af Amer: >60 mL/min (ref >60)
Glucose, Bld: 100 mg/dL — ABNORMAL HIGH (ref 70–99)
Potassium: 3.5 mmol/L (ref 3.5–5.1)
Sodium: 137 mmol/L (ref 135–145)
Total Bilirubin: 2.1 mg/dL — ABNORMAL HIGH (ref 0.3–1.2)
Total Protein: 6.8 g/dL (ref 6.5–8.1)

## 2018-12-17 LAB — CBC WITH DIFFERENTIAL/PLATELET
Abs Immature Granulocytes: 0.15 10*3/uL — ABNORMAL HIGH (ref 0.00–0.07)
Basophils Absolute: 0.1 10*3/uL (ref 0.0–0.1)
Basophils Relative: 1 %
Eosinophils Absolute: 0.4 10*3/uL (ref 0.0–0.5)
Eosinophils Relative: 3 %
HCT: 21.9 % — ABNORMAL LOW (ref 36.0–46.0)
Hemoglobin: 7 g/dL — ABNORMAL LOW (ref 12.0–15.0)
Immature Granulocytes: 1 %
Lymphocytes Relative: 13 %
Lymphs Abs: 1.6 10*3/uL (ref 0.7–4.0)
MCH: 31.3 pg (ref 26.0–34.0)
MCHC: 32 g/dL (ref 30.0–36.0)
MCV: 97.8 fL (ref 80.0–100.0)
Monocytes Absolute: 1.4 10*3/uL — ABNORMAL HIGH (ref 0.1–1.0)
Monocytes Relative: 11 %
Neutro Abs: 8.5 10*3/uL — ABNORMAL HIGH (ref 1.7–7.7)
Neutrophils Relative %: 71 %
Platelets: 334 10*3/uL (ref 150–400)
RBC: 2.24 MIL/uL — ABNORMAL LOW (ref 3.87–5.11)
RDW: 17.8 % — ABNORMAL HIGH (ref 11.5–15.5)
WBC: 12.1 10*3/uL — ABNORMAL HIGH (ref 4.0–10.5)
nRBC: 23 % — ABNORMAL HIGH (ref 0.0–0.2)

## 2018-12-17 MED ORDER — HEPARIN SOD (PORK) LOCK FLUSH 100 UNIT/ML IV SOLN
500.0000 [IU] | INTRAVENOUS | Status: AC | PRN
  Administered 2018-12-17: 500 [IU]

## 2018-12-17 NOTE — Care Management Important Message (Signed)
Important Message  Patient Details IM Letter given to Sharren Bridge SW to present to the Patient Name: [name] MRN: [id number] Date of Birth: 11/22/1990   Medicare Important Message Given:  Yes     Kerin Salen 12/17/2018, 12:48 PM

## 2018-12-17 NOTE — Progress Notes (Signed)
This shift pt walked independantly the entire length of hallway and back. Tolerated well no complaints afterwards.

## 2018-12-17 NOTE — Discharge Summary (Signed)
Physician Discharge Summary  [name] XBM:[id number] DOB: 06-Apr-1991 DOA: 12/10/2018  PCP: Dorena Dew, FNP  Admit date: 12/10/2018  Discharge date: 12/17/2018  Discharge Diagnoses:  Active Problems:   Sickle cell pain crisis ([name])   Anemia of chronic disease   Leukocytosis   Fever   Chest pain, atypical   Acute bilateral low back pain without sciatica   Systemic inflammatory response syndrome [facility])   Discharge Condition: Stable  Disposition:  Follow-up Information    Dorena Dew, FNP. Schedule an appointment as soon as possible for a visit in 3 day(s).   Specialty: Family Medicine Contact information: [address] [place] [address] [phone]          Pt is discharged home in good condition and is to follow up with Dorena Dew, FNP this week to have labs evaluated. [name] is instructed to increase activity slowly and balance with rest for the next few days, and use prescribed medication to complete treatment of pain  Diet: Regular Wt Readings from Last 3 Encounters:  12/16/18 94.9 kg  11/20/18 93.1 kg  11/06/18 92.5 kg    History of present illness:  [name]  is a 28 y.o. female with a medical history significant for sickle cell disease, type SS, chronic pain syndrome, history of DVT (completed anticoagulation), and anemia of chronic disease presents complaining of pain primarily to mid chest, upper, and lower extremities that is consistent with typical sickle cell pain crisis.  Patient attributes current pain crisis to menses.  Also, she states that she has been under a great deal of stress with taking college courses.  Patient is primarily opiate nave.  She takes Tylenol, ibuprofen, and gabapentin for pain control.  She also takes hydroxyurea and folic acid.  Patient is under the care of [facility] for sickle cell disease.  Patient has been without opiate prescription for greater than 10 months due to  recent incarceration.  Patient complains of pain primarily to mid chest, characterized as intermittent, sharp, and nonradiating.  She states that pain started this morning while in shower, chest pain occurred suddenly and sharp.  She says that pain moved to upper and lower extremities during her ambulance ride. She currently denies headache, shortness of breath, persistent cough, sore throat, recent travel, sick contacts, or exposure to COVID-19.  Patient also denies fever, chills, nausea, vomiting, or diarrhea.  ER course: CT angiogram is negative for pulmonary embolism.  Chest x-ray shows no acute cardiopulmonary process.  COVID-19 test negative.  Beta hCG negative.  WBCs 11, hemoglobin 7.6, and platelets 456.  Total bilirubin 2.0.  All other laboratory values unremarkable.  Patient afebrile and maintaining oxygen saturation above 90% on RA.  Pain persists despite IV Dilaudid, IV fluids, and IV Toradol.  Patient admitted to [facility] for management of sickle cell pain crisis.  Hospital Course:  Patient was admitted for sickle cell pain crisis and managed appropriately with IVF, IV Dilaudid via PCA and IV Toradol, as well as other adjunct therapies per sickle cell pain management protocols.  Hemoglobin dropped to a nadir of 5.5 and she was transfused with 2 units of packed red blood cells.  She remained hemodynamically stable throughout this admission.  Patient is self weaning off of opiates medication, during this admission she did not want to be discharged with narcotics, so she stayed for about 7 days for proper weaning and she will continue with ibuprofen at home.  She claims  that she is relocating to [facility] on the 13th of this month to reconnect with her father and her children.  She did well on above regimen, successfully weaned off of Dilaudid PCA and oral opiates, and is up today pain is tolerable, she is ambulating well without restrictions, she is tolerating p.o. intake with no restrictions.   Therefore patient was discharged home today in a hemodynamically stable condition.  She will follow-up in the clinic for her birth control injection within a week of this discharge and certainly before she relocates to [facility].  Patient was extensively counseled, and encouraged to stay away from triggers of sickle cell crisis, stay hydrated at all time, avoid excessive heat or excessive cold and engage in light regular physical exercise.  Patient verbalized understanding and was grateful for her care during this admission.  Discharge Exam: Vitals:   12/17/18 0804 12/17/18 0813  BP: 103/62   Pulse: 86   Resp: 15 14  Temp: 99.1 F (37.3 C)   SpO2: 95% 96%   Vitals:   12/17/18 0434 12/17/18 0513 12/17/18 0804 12/17/18 0813  BP:  122/63 103/62   Pulse:  89 86   Resp: 15 10 15 14   Temp:  98.4 F (36.9 C) 99.1 F (37.3 C)   TempSrc:  Oral Oral   SpO2: 92% 96% 95% 96%  Weight:      Height:       General appearance : Awake, alert, not in any distress. Speech Clear. Not toxic looking HEENT: Atraumatic and Normocephalic, pupils equally reactive to light and accomodation Neck: Supple, no JVD. No cervical lymphadenopathy.  Chest: Good air entry bilaterally, no added sounds  CVS: S1 S2 regular, no murmurs.  Abdomen: Bowel sounds present, Non tender and not distended with no gaurding, rigidity or rebound. Extremities: B/L Lower Ext shows no edema, both legs are warm to touch Neurology: Awake alert, and oriented X 3, CN II-XII intact, Non focal Skin: No Rash  Discharge Instructions  Discharge Instructions    Diet - low sodium heart healthy   Complete by: As directed    Increase activity slowly   Complete by: As directed      Allergies as of 12/17/2018      Reactions   Beta-carotene Hives, Swelling   Had hives and swelling in mouth and neck area   Carrot [daucus [place]] Hives, Swelling   Allergic to carrots   Latex Rash, Hives      Medication List    TAKE these medications    acetaminophen 650 MG CR tablet Commonly known as: TYLENOL Take 650 mg by mouth every 8 (eight) hours as needed for pain.   folic acid 1 MG tablet Commonly known as: FOLVITE Take 1 mg by mouth daily.   gabapentin 300 MG capsule Commonly known as: NEURONTIN Take 1 capsule (300 mg total) by mouth 3 (three) times daily.   hydroxyurea 500 MG capsule Commonly known as: HYDREA Take 500 mg by mouth 2 (two) times a day.   ibuprofen 800 MG tablet Commonly known as: ADVIL Take 1 tablet (800 mg total) by mouth every 8 (eight) hours as needed for moderate pain.   ondansetron 4 MG tablet Commonly known as: Zofran Take 1 tablet (4 mg total) by mouth daily as needed for nausea or vomiting.       The results of significant diagnostics from this hospitalization (including imaging, microbiology, ancillary and laboratory) are listed below for reference.    Significant Diagnostic Studies: Dg  Chest 2 View  Result Date: 12/12/2018 CLINICAL DATA:  Sickle cell patient.  Pain crisis. EXAM: CHEST - 2 VIEW COMPARISON:  Chest x-ray 12/10/2018 and lumbar spine 09/29/2017 FINDINGS: Right IJ Port-A-Cath unchanged. Lungs are adequately inflated without focal airspace consolidation or effusion. Cardiomediastinal silhouette and remainder of the exam is unchanged. IMPRESSION: No active cardiopulmonary disease. Electronically Signed   By: Marin Olp M.D.   On: 12/12/2018 10:38   Ct Angio Chest Pe W/cm &/or Wo Cm  Result Date: 12/10/2018 CLINICAL DATA:  PE suspected EXAM: CT ANGIOGRAPHY CHEST WITH CONTRAST TECHNIQUE: Multidetector CT imaging of the chest was performed using the standard protocol during bolus administration of intravenous contrast. Multiplanar CT image reconstructions and MIPs were obtained to evaluate the vascular anatomy. CONTRAST:  134mL OMNIPAQUE IOHEXOL 350 MG/ML SOLN COMPARISON:  02/27/2017 FINDINGS: Cardiovascular: Satisfactory opacification of the pulmonary arteries to the segmental level.  No evidence of pulmonary embolism. Mild cardiomegaly. No pericardial effusion. Right chest port catheter. Venous collaterals about the chest wall. Mediastinum/Nodes: No enlarged mediastinal, hilar, or axillary lymph nodes. Thyroid gland, trachea, and esophagus demonstrate no significant findings. Lungs/Pleura: Lungs are clear. Stable, benign small pulmonary nodule of the left lower lobe (series 12, image 56). No pleural effusion or pneumothorax. Upper Abdomen: No acute abnormality.  Partial splenic involution. Musculoskeletal: No chest wall abnormality. H-shaped vertebral body endplate deformities. Review of the MIP images confirms the above findings. IMPRESSION: 1.  Negative examination for pulmonary embolism. 2.  Splenic and osseous stigmata of sickle cell disease. Electronically Signed   By: Eddie Candle M.D.   On: 12/10/2018 16:13   Dg Chest Port 1 View  Result Date: 12/10/2018 CLINICAL DATA:  28 year old female with sickle cell pain crisis. Mid chest pain and shortness of breath. EXAM: PORTABLE CHEST 1 VIEW COMPARISON:  Portable chest 11/20/2018 and earlier. FINDINGS: Portable AP semi upright view at 1014 hours. Stable right chest power port, no longer accessed. Stable lung volumes and mediastinal contours. Allowing for portable technique the lungs are clear. Visualized tracheal air column is within normal limits. Borderline to mild cardiomegaly. Paucity bowel gas in the upper abdomen. No acute osseous abnormality identified. IMPRESSION: No acute cardiopulmonary abnormality. Electronically Signed   By: Genevie Ann M.D.   On: 12/10/2018 10:52   Dg Chest Port 1 View  Result Date: 11/20/2018 CLINICAL DATA:  Back pain EXAM: PORTABLE CHEST 1 VIEW COMPARISON:  October 22, 2018 FINDINGS: There is a stable right-sided Port-A-Cath. The lungs are essentially clear. The heart size is stable. A vascular necrosis is noted of both humeral heads. There is no large pleural effusion. No acute osseous abnormality. IMPRESSION: No  active disease. Electronically Signed   By: Constance Holster M.D.   On: 11/20/2018 21:32    Microbiology: Recent Results (from the past 240 hour(s))  SARS Coronavirus 2 (CEPHEID - Performed in [facility] hospital lab), Hosp Order     Status: None   Collection Time: 12/10/18  1:55 PM   Specimen: Nasopharyngeal Swab  Result Value Ref Range Status   SARS Coronavirus 2 NEGATIVE NEGATIVE Final    Comment: (NOTE) If result is NEGATIVE SARS-CoV-2 target nucleic acids are NOT DETECTED. The SARS-CoV-2 RNA is generally detectable in upper and lower  respiratory specimens during the acute phase of infection. The lowest  concentration of SARS-CoV-2 viral copies this assay can detect is 250  copies / mL. A negative result does not preclude SARS-CoV-2 infection  and should not be used as the sole basis for  treatment or other  patient management decisions.  A negative result may occur with  improper specimen collection / handling, submission of specimen other  than nasopharyngeal swab, presence of viral mutation(s) within the  areas targeted by this assay, and inadequate number of viral copies  (<250 copies / mL). A negative result must be combined with clinical  observations, patient history, and epidemiological information. If result is POSITIVE SARS-CoV-2 target nucleic acids are DETECTED. The SARS-CoV-2 RNA is generally detectable in upper and lower  respiratory specimens dur ing the acute phase of infection.  Positive  results are indicative of active infection with SARS-CoV-2.  Clinical  correlation with patient history and other diagnostic information is  necessary to determine patient infection status.  Positive results do  not rule out bacterial infection or co-infection with other viruses. If result is PRESUMPTIVE POSTIVE SARS-CoV-2 nucleic acids MAY BE PRESENT.   A presumptive positive result was obtained on the submitted specimen  and confirmed on repeat testing.  While 2019 novel  coronavirus  (SARS-CoV-2) nucleic acids may be present in the submitted sample  additional confirmatory testing may be necessary for epidemiological  and / or clinical management purposes  to differentiate between  SARS-CoV-2 and other Sarbecovirus currently known to infect humans.  If clinically indicated additional testing with an alternate test  methodology [phone]) is advised. The SARS-CoV-2 RNA is generally  detectable in upper and lower respiratory sp ecimens during the acute  phase of infection. The expected result is Negative. Fact Sheet for Patients:  StrictlyIdeas.no Fact Sheet for Healthcare Providers: BankingDealers.co.za This test is not yet approved or cleared by the [place] FDA and has been authorized for detection and/or diagnosis of SARS-CoV-2 by FDA under an Emergency Use Authorization (EUA).  This EUA will remain in effect (meaning this test can be used) for the duration of the COVID-19 declaration under Section 564(b)(1) of the Act, 21 U.S.C. section 360bbb-3(b)(1), unless the authorization is terminated or revoked sooner. Performed at [facility], [place] [address] [place], [place] [address]   Culture, blood (Routine X 2) w Reflex to ID Panel     Status: None   Collection Time: 12/12/18 10:39 AM   Specimen: BLOOD  Result Value Ref Range Status   Specimen Description   Final    BLOOD LEFT ANTECUBITAL Performed at [facility] [address] [place], [place] [address]    Special Requests   Final    BOTTLES DRAWN AEROBIC ONLY Blood Culture results may not be optimal due to an inadequate volume of blood received in culture bottles Performed at [facility] [address], [place], [place] [address]    Culture   Final    NO GROWTH 5 DAYS Performed at [facility], [place] [address] [place], [place] [address]    Report Status 12/17/2018 FINAL   Final  Culture, blood (Routine X 2) w Reflex to ID Panel     Status: None   Collection Time: 12/12/18 12:58 PM   Specimen: BLOOD  Result Value Ref Range Status   Specimen Description   Final    BLOOD LEFT ARM Performed at [facility] [address], [place],  [address]    Special Requests   Final    BOTTLES DRAWN AEROBIC ONLY Blood Culture results may not be optimal due to an inadequate volume of blood received in culture bottles Performed at [facility] [address] [place],  [place] [address]    Culture   Final    NO GROWTH 5 DAYS Performed at [facility], [place] [address], [place], [place] [address]    Report Status 12/17/2018 FINAL  Final  Urine Culture     Status: None   Collection Time: 12/13/18  6:00 AM   Specimen: Urine, Random  Result Value Ref Range Status   Specimen Description   Final    URINE, RANDOM Performed at [facility] [address] [place], [name] [address]    Special Requests   Final    NONE Performed at [facility], [place] [address] [place], [place] [address]    Culture   Final    NO GROWTH Performed at [facility], [place] [address] [place], [place] [address]    Report Status 12/14/2018 FINAL  Final     Labs: Basic Metabolic Panel: Recent Labs  Lab 12/11/18 0615 12/12/18 1039 12/13/18 1016 12/17/18 0326  NA 138 136 137 137  K 3.6 3.4* 3.4* 3.5  CL 107 101 102 103  CO2 27 26 26 26   GLUCOSE 100* 109* 112* 100*  BUN 11 7 9 8   CREATININE 0.70 0.56 0.59 0.52  CALCIUM 8.4* 8.3* 8.1* 8.5*   Liver Function Tests: Recent Labs  Lab 12/11/18 0615 12/17/18 0326  AST 24 47*  ALT 25 91*  ALKPHOS 73 151*  BILITOT 3.2* 2.1*  PROT 6.9 6.8  ALBUMIN 3.9 3.2*   No results for input(s): LIPASE, AMYLASE in the last 168 hours. No results for input(s): AMMONIA in the last 168 hours. CBC: Recent Labs  Lab 12/11/18 0615 12/12/18 0430  12/13/18 1016 12/14/18 0921 12/15/18 0623 12/17/18 0326  WBC 19.8* 23.6* 20.1* 16.1*  --  12.1*  NEUTROABS 14.0*  --   --   --   --  8.5*  HGB 6.9* 6.7* 6.4* 5.5* 6.7* 7.0*  HCT 19.9* 19.5* 19.1* 16.5* 20.4* 21.9*  MCV 96.1 96.1 97.9 98.2  --  97.8  PLT 392 409* 356 338  --  334   Cardiac Enzymes: No results for input(s): CKTOTAL, CKMB, CKMBINDEX, TROPONINI in the last 168 hours. BNP: Invalid input(s): POCBNP CBG: No results for input(s): GLUCAP in the last 168 hours.  Time coordinating discharge: 50 minutes  Signed:  [facility] 12/17/2018, 11:03 AM

## 2018-12-17 NOTE — Discharge Instructions (Signed)
Sickle Cell Anemia, Adult ° °Sickle cell anemia is a condition in which red blood cells have an abnormal “sickle” shape. Red blood cells carry oxygen through the body. Sickle-shaped red blood cells do not live as long as normal red blood cells. They also clump together and block blood from flowing through the blood vessels. This condition prevents the body from getting enough oxygen. Sickle cell anemia causes organ damage and pain. It also increases the risk of infection. °What are the causes? °This condition is caused by a gene that is passed from parent to child (inherited). Receiving two copies of the gene causes the disease. Receiving one copy causes the "trait," which means that symptoms are milder or not present. °What increases the risk? °This condition is more likely to develop if your ancestors were from [place], the Mediterranean, South or [place], the [place], [place], or the Middle [place]. °What are the signs or symptoms? °Symptoms of this condition include: °· Episodes of pain (crises), especially in the hands and feet, joints, back, chest, or abdomen. The pain can be triggered by: °? An illness, especially if there is dehydration. °? Doing an activity with great effort (overexertion). °? Exposure to extreme temperature changes. °? High altitude. °· Fatigue. °· Shortness of breath or difficulty breathing. °· Dizziness. °· Pale skin or yellowed skin (jaundice). °· Frequent bacterial infections. °· Pain and swelling in the hands and feet (hand-food syndrome). °· Prolonged, painful erection of the penis (priapism). °· Acute chest syndrome. Symptoms of this include: °? Chest pain. °? Fever. °? Cough. °? Fast breathing. °· Stroke. °· Decreased activity. °· Loss of appetite. °· Change in behavior. °· Headaches. °· Seizures. °· Vision changes. °· Skin ulcers. °· Heart disease. °· High blood pressure. °· Gallstones. °· Liver and kidney problems. °How is this diagnosed? °This condition is diagnosed with  blood tests that check for the gene that causes this condition. °How is this treated? °There is no cure for most cases of this condition. Treatment focuses on managing your symptoms and preventing complications of the disease. Your health care provider will work with you to identify the best treatment options for you based on an assessment of your condition. Treatment may include: °· Medicines, including: °? Pain medicines. °? Antibiotic medicines for infection. °? Medicines to increase the production of a protein in red blood cells that helps carry oxygen in the body (hemoglobin). °· Fluids to treat pain and swelling. °· Oxygen to treat acute chest syndrome. °· Blood transfusions to treat symptoms such as fatigue, stroke, and acute chest syndrome. °· Massage and physical therapy for pain. °· Regular tests to monitor your condition, such as blood tests, X-rays, CT scans, MRI scans, ultrasounds, and lung function tests. These should be done every 3-12 months, depending on your age. °· Hematopoietic stem cell transplant. This is a procedure to replace abnormal stem cells with healthy stem cells from a donor's bone marrow. Stem cells are cells that can develop into blood cells, and bone marrow is the spongy tissue inside the bones. °Follow these instructions at home: °Medicines °· Take over-the-counter and prescription medicines only as told by your health care provider. °· If you were prescribed an antibiotic medicine, take it as told by your health care provider. Do not stop taking the antibiotic even if you start to feel better. °· If you develop a fever, do not take medicines to reduce the fever right away. This could cover up another problem. Notify your health care provider. °Managing   pain, stiffness, and swelling °· Try these methods to help ease your pain: °? Using a heating pad. °? Taking a warm bath. °? Distracting yourself, such as by watching TV. °Eating and drinking °· Drink enough fluid to keep your urine  clear or pale yellow. Drink more in hot weather and during exercise. °· Limit or avoid drinking alcohol. °· Eat a balanced and nutritious diet. Eat plenty of fruits, vegetables, whole grains, and lean protein. °· Take vitamins and supplements as directed by your health care provider. °Traveling °· When traveling, keep these with you: °? Your medical information. °? The names of your health care providers. °? Your medicines. °· If you have to travel by air, ask about precautions you should take. °Activity °· Get plenty of rest. °· Avoid activities that will lower your oxygen levels, such as exercising vigorously. °General instructions °· Do not use any products that contain nicotine or tobacco, such as cigarettes and e-cigarettes. They lower blood oxygen levels. If you need help quitting, ask your health care provider. °· Consider wearing a medical alert bracelet. °· Avoid high altitudes. °· Avoid extreme temperatures and extreme temperature changes. °· Keep all follow-up visits as told by your health care provider. This is important. °Contact a health care provider if: °· You develop joint pain. °· Your feet or hands swell or have pain. °· You have fatigue. °Get help right away if: °· You have symptoms of infection. These include: °? Fever. °? Chills. °? Extreme tiredness. °? Irritability. °? Poor eating. °? Vomiting. °· You feel dizzy or faint. °· You have new abdominal pain, especially on the left side near the stomach area. °· You develop priapism. °· You have numbness in your arms or legs or have trouble moving them. °· You have trouble talking. °· You develop pain that cannot be controlled with medicine. °· You become short of breath. °· You have rapid breathing. °· You have a persistent cough. °· You have pain in your chest. °· You develop a severe headache or stiff neck. °· You feel bloated without eating or after eating a small amount of food. °· Your skin is pale. °· You suddenly lose  vision. °Summary °· Sickle cell anemia is a condition in which red blood cells have an abnormal “sickle” shape. This disease can cause organ damage and chronic pain, and it can raise your risk of infection. °· Sickle cell anemia is a genetic disorder. °· Treatment focuses on managing your symptoms and preventing complications of the disease. °· Get medical help right away if you have any signs of infection, such as a fever. °This information is not intended to replace advice given to you by your health care provider. Make sure you discuss any questions you have with your health care provider. °Document Released: 08/10/2005 Document Revised: 08/24/2018 Document Reviewed: 06/07/2016 °Elsevier Patient Education © 2020 [employer] ° °

## 2018-12-18 ENCOUNTER — Encounter (HOSPITAL_COMMUNITY): Payer: Self-pay | Admitting: Pharmacist

## 2019-01-01 ENCOUNTER — Ambulatory Visit (INDEPENDENT_AMBULATORY_CARE_PROVIDER_SITE_OTHER): Payer: Medicare Other | Admitting: Family Medicine

## 2019-01-01 ENCOUNTER — Other Ambulatory Visit: Payer: Self-pay

## 2019-01-01 ENCOUNTER — Encounter: Payer: Self-pay | Admitting: Family Medicine

## 2019-01-01 VITALS — BP 120/82 | HR 99 | Temp 98.7°F | Resp 16 | Ht 61.0 in | Wt 211.0 lb

## 2019-01-01 DIAGNOSIS — Z3202 Encounter for pregnancy test, result negative: Secondary | ICD-10-CM

## 2019-01-01 DIAGNOSIS — M879 Osteonecrosis, unspecified: Secondary | ICD-10-CM

## 2019-01-01 DIAGNOSIS — Z3042 Encounter for surveillance of injectable contraceptive: Secondary | ICD-10-CM

## 2019-01-01 DIAGNOSIS — Z30011 Encounter for initial prescription of contraceptive pills: Secondary | ICD-10-CM

## 2019-01-01 LAB — POCT URINE PREGNANCY: Preg Test, Ur: NEGATIVE

## 2019-01-01 MED ORDER — NORETHINDRONE ACET-ETHINYL EST 1-20 MG-MCG PO TABS: 1.0000 | ORAL_TABLET | Freq: Every day | ORAL | 11 refills | Status: DC

## 2019-01-01 MED ORDER — MEDROXYPROGESTERONE ACETATE 150 MG/ML IM SUSP
150.0000 mg | Freq: Once | INTRAMUSCULAR | Status: AC
  Administered 2019-01-01: 150 mg via INTRAMUSCULAR

## 2019-01-01 NOTE — Patient Instructions (Signed)
Place depot medroxyprogesterone acetate injection patient instructions here.  Medroxyprogesterone injection [Contraceptive] What is this medicine? MEDROXYPROGESTERONE (me DROX ee proe JES te rone) contraceptive injections prevent pregnancy. They provide effective birth control for 3 months. Depo-subQ Provera 104 is also used for treating pain related to endometriosis. This medicine may be used for other purposes; ask your health care provider or pharmacist if you have questions. COMMON BRAND NAME(S): Depo-Provera, Depo-subQ Provera 104 What should I tell my health care provider before I take this medicine? They need to know if you have any of these conditions:  frequently drink alcohol  asthma  blood vessel disease or a history of a blood clot in the lungs or legs  bone disease such as osteoporosis  breast cancer  diabetes  eating disorder (anorexia nervosa or bulimia)  high blood pressure  HIV infection or AIDS  kidney disease  liver disease  mental depression  migraine  seizures (convulsions)  stroke  tobacco smoker  vaginal bleeding  an unusual or allergic reaction to medroxyprogesterone, other hormones, medicines, foods, dyes, or preservatives  pregnant or trying to get pregnant  breast-feeding How should I use this medicine? Depo-Provera Contraceptive injection is given into a muscle. Depo-subQ Provera 104 injection is given under the skin. These injections are given by a health care professional. You must not be pregnant before getting an injection. The injection is usually given during the first 5 days after the start of a menstrual period or 6 weeks after delivery of a baby. Talk to your pediatrician regarding the use of this medicine in children. Special care may be needed. These injections have been used in female children who have started having menstrual periods. Overdosage: If you think you have taken too much of this medicine contact a poison control  center or emergency room at once. NOTE: This medicine is only for you. Do not share this medicine with others. What if I miss a dose? Try not to miss a dose. You must get an injection once every 3 months to maintain birth control. If you cannot keep an appointment, call and reschedule it. If you wait longer than 13 weeks between Depo-Provera contraceptive injections or longer than 14 weeks between Depo-subQ Provera 104 injections, you could get pregnant. Use another method for birth control if you miss your appointment. You may also need a pregnancy test before receiving another injection. What may interact with this medicine? Do not take this medicine with any of the following medications:  bosentan This medicine may also interact with the following medications:  aminoglutethimide  antibiotics or medicines for infections, especially rifampin, rifabutin, rifapentine, and griseofulvin  aprepitant  barbiturate medicines such as phenobarbital or primidone  bexarotene  carbamazepine  medicines for seizures like ethotoin, felbamate, oxcarbazepine, phenytoin, topiramate  modafinil  St. John's wort This list may not describe all possible interactions. Give your health care provider a list of all the medicines, herbs, non-prescription drugs, or dietary supplements you use. Also tell them if you smoke, drink alcohol, or use illegal drugs. Some items may interact with your medicine. What should I watch for while using this medicine? This drug does not protect you against HIV infection (AIDS) or other sexually transmitted diseases. Use of this product may cause you to lose calcium from your bones. Loss of calcium may cause weak bones (osteoporosis). Only use this product for more than 2 years if other forms of birth control are not right for you. The longer you use this product for birth  control the more likely you will be at risk for weak bones. Ask your health care professional how you can keep  strong bones. You may have a change in bleeding pattern or irregular periods. Many females stop having periods while taking this drug. If you have received your injections on time, your chance of being pregnant is very low. If you think you may be pregnant, see your health care professional as soon as possible. Tell your health care professional if you want to get pregnant within the next year. The effect of this medicine may last a long time after you get your last injection. What side effects may I notice from receiving this medicine? Side effects that you should report to your doctor or health care professional as soon as possible:  allergic reactions like skin rash, itching or hives, swelling of the face, lips, or tongue  breast tenderness or discharge  breathing problems  changes in vision  depression  feeling faint or lightheaded, falls  fever  pain in the abdomen, chest, groin, or leg  problems with balance, talking, walking  unusually weak or tired  yellowing of the eyes or skin Side effects that usually do not require medical attention (report to your doctor or health care professional if they continue or are bothersome):  acne  fluid retention and swelling  headache  irregular periods, spotting, or absent periods  temporary pain, itching, or skin reaction at site where injected  weight gain This list may not describe all possible side effects. Call your doctor for medical advice about side effects. You may report side effects to FDA at 1-800-FDA-1088. Where should I keep my medicine? This does not apply. The injection will be given to you by a health care professional. NOTE: This sheet is a summary. It may not cover all possible information. If you have questions about this medicine, talk to your doctor, pharmacist, or health care provider.  2020 Elsevier/Gold Standard (2008-05-23 18:37:56)

## 2019-01-01 NOTE — Progress Notes (Signed)
Established Patient Office Visit  Subjective:  Patient ID: [name], female    DOB: [birth year]  Age: 28 y.o. MRN: [id number]  CC:  Chief Complaint  Patient presents with  . Contraception    wants to do bc pills     HPI [name], a 28 year old female with a medical history significant for sickle cell disease, type SS, chronic pain syndrome, history of polysubstance abuse, history of trichotillomania, and history of tobacco dependence presents for contraception management. Patient presents for management of contraception.  She is currently sexually active and endorses using barrier protection.  Patient last had Pap smear 2 years ago.  She endorses irregular menstrual cycles.  She is taken birth control pills in the past, but has had difficulty with adherence to medication regimen.  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  Cinnamon has no complaints today. She denies headache, dizziness, chest pains, heart palpitations, dysuria, urinary frequency, dyspareunia, nausea, vomiting, or diarrhea..   Past Medical History:  Diagnosis Date  . Blood transfusion    "lots"  . Chronic back pain    "very severe; have knot in my back; from tight muscle; take RX and exercise for it"  . Depression 01/06/2011  . Exertional dyspnea    "sometimes"  . Genital HSV   . GERD (gastroesophageal reflux disease) 02/17/2011  . Migraines 11/08/11   "@ least twice/month"  . 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  . Mood swings 11/08/11   "I go back and forth; real bad"  . Sickle cell anemia ([name])   . Sickle cell anemia with crisis ([place])   . Thrombocytosis ([place]) 11/09/2015  . Trichotillomania    h/o    Past Surgical History:  Procedure Laterality Date  . CHOLECYSTECTOMY  05/2010  . DILATION AND CURETTAGE OF UTERUS  02/20/11   S/P miscarriage  . IR GENERIC HISTORICAL  12/23/2015   IR FLUORO GUIDE CV LINE RIGHT 12/23/2015 Jacqulynn Cadet,  MD [facility]  . IR GENERIC HISTORICAL  12/23/2015   IR US GUIDE VASC ACCESS RIGHT 12/23/2015 Jacqulynn Cadet, MD [facility]    Family History  Problem Relation Age of Onset  . Sickle cell trait Mother   . Sickle cell trait Father   . Diabetes Maternal Grandmother   . Diabetes Paternal Grandmother   . Hypertension Paternal Grandmother   . Diabetes Maternal Grandfather     Social History   Socioeconomic History  . Marital status: Single    Spouse name: Not on file  . Number of children: Not on file  . Years of education: Not on file  . Highest education level: Not on file  Occupational History  . Occupation: disabled  Social Needs  . Financial resource strain: Not on file  . Food insecurity    Worry: Not on file    Inability: Not on file  . Transportation needs    Medical: Not on file    Non-medical: Not on file  Tobacco Use  . Smoking status: Current Some Day Smoker    Packs/day: 0.25    Years: 1.00    Pack years: 0.25    Types: Cigarettes    Last attempt to quit: 03/25/2013    Years since quitting: 5.7  . Smokeless tobacco: Never Used  Substance and Sexual Activity  . Alcohol use: No    Comment: denies  . Drug use: No    Types: Other-see comments  Comment: "Clean for 3 months"  . Sexual activity: Yes    Partners: Male    Birth control/protection: None    Comment: last sex Jul 11 2015  Lifestyle  . Physical activity    Days per week: Not on file    Minutes per session: Not on file  . Stress: Not on file  Relationships  . Social Herbalist on phone: Not on file    Gets together: Not on file    Attends religious service: Not on file    Active member of club or organization: Not on file    Attends meetings of clubs or organizations: Not on file    Relationship status: Not on file  . Intimate partner violence    Fear of current or ex partner: Not on file    Emotionally abused: Not on file    Physically abused: Not on file    Forced  sexual activity: Not on file  Other Topics Concern  . Not on file  Social History Narrative   Lives  With mother   FOB is supportive-supposed to be moving next month   Is a student at [employer] time    Outpatient Medications Prior to Visit  Medication Sig Dispense Refill  . acetaminophen (TYLENOL) 650 MG CR tablet Take 650 mg by mouth every 8 (eight) hours as needed for pain.    . hydroxyurea (HYDREA) 500 MG capsule Take 500 mg by mouth 2 (two) times a day.    . ibuprofen (ADVIL) 800 MG tablet Take 1 tablet (800 mg total) by mouth every 8 (eight) hours as needed for moderate pain. 30 tablet 0  . folic acid (FOLVITE) 1 MG tablet Take 1 mg by mouth daily.    Marland Kitchen gabapentin (NEURONTIN) 300 MG capsule Take 1 capsule (300 mg total) by mouth 3 (three) times daily. (Patient not taking: Reported on 01/01/2019) 90 capsule 0  . ondansetron (ZOFRAN) 4 MG tablet Take 1 tablet (4 mg total) by mouth daily as needed for nausea or vomiting. (Patient not taking: Reported on 12/10/2018) 30 tablet 1   No facility-administered medications prior to visit.     Allergies  Allergen Reactions  . Beta-Carotene Hives and Swelling    Had hives and swelling in mouth and neck area  . Carrot [Daucus Carota] Hives and Swelling    Allergic to carrots  . Latex Rash and Hives    ROS Review of Systems  Constitutional: Negative for activity change and appetite change.  HENT: Negative.  Negative for congestion and dental problem.   Eyes: Negative.   Respiratory: Negative.   Cardiovascular: Negative for chest pain, palpitations and leg swelling.  Endocrine: Negative.   Genitourinary: Negative for decreased urine volume, difficulty urinating, dyspareunia, dysuria, vaginal bleeding and vaginal discharge.  Neurological: Negative.   Hematological: Negative.   Psychiatric/Behavioral: Negative.       Objective:    Physical Exam  Constitutional: She appears well-developed and well-nourished.  HENT:  Head:  Normocephalic and atraumatic.  Eyes: Pupils are equal, round, and reactive to light.  Neck: Normal range of motion.  Cardiovascular: Normal rate and regular rhythm.  Pulmonary/Chest: Effort normal and breath sounds normal.  Abdominal: Soft. Bowel sounds are normal.  Neurological: She is alert.  Skin: Skin is warm and dry.  Psychiatric: She has a normal mood and affect. Her behavior is normal. Judgment and thought content normal.    BP 120/82 (BP Location: Left Arm, Patient Position: Sitting, Cuff Size:  Large)   Pulse 99   Temp 98.7 F (37.1 C) (Oral)   Resp 16   Ht 5\' 1"  (1.549 m)   Wt 211 lb (95.7 kg)   LMP 12/07/2018   SpO2 99%   BMI 39.87 kg/m  Wt Readings from Last 3 Encounters:  01/01/19 211 lb (95.7 kg)  12/16/18 209 lb 3.2 oz (94.9 kg)  11/20/18 205 lb 4.8 oz (93.1 kg)     There are no preventive care reminders to display for this patient.  There are no preventive care reminders to display for this patient.  Lab Results  Component Value Date   TSH 0.407 08/27/2012   Lab Results  Component Value Date   WBC 12.1 (H) 12/17/2018   HGB 7.0 (L) 12/17/2018   HCT 21.9 (L) 12/17/2018   MCV 97.8 12/17/2018   PLT 334 12/17/2018   Lab Results  Component Value Date   NA 137 12/17/2018   K 3.5 12/17/2018   CO2 26 12/17/2018   GLUCOSE 100 (H) 12/17/2018   BUN 8 12/17/2018   CREATININE 0.52 12/17/2018   BILITOT 2.1 (H) 12/17/2018   ALKPHOS 151 (H) 12/17/2018   AST 47 (H) 12/17/2018   ALT 91 (H) 12/17/2018   PROT 6.8 12/17/2018   ALBUMIN 3.2 (L) 12/17/2018   CALCIUM 8.5 (L) 12/17/2018   ANIONGAP 8 12/17/2018   No results found for: CHOL No results found for: HDL No results found for: LDLCALC No results found for: TRIG No results found for: CHOLHDL No results found for: HGBA1C    Assessment & Plan:   Problem List Items Addressed This Visit      Musculoskeletal and Integument   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 DISCONTD: norethindrone-ethinyl estradiol (LOESTRIN 1/20, 21,) 1-20 MG-MCG tablet    Sig: Take 1 tablet by mouth daily.    Dispense:  1 Package    Refill:  11    Order Specific Question:   Supervising Provider    Answer:   Tresa Garter [id number]  . medroxyPROGESTERone (DEPO-PROVERA) injection 150 mg     Encounter for initial prescription of contraceptive pills Patient and I discussed birth control options at length.  Patient has been nonadherent to medication regimen in the past.  She is unsure whether she could hear to taking birth control pills daily due to busy lifestyle and she is in the process of moving to [place].  Patient is a mother to small children.  Patient will start Depo-Provera injections every 3 months for birth control.  She was also advised to use barrier protection with sexual intercourse to protect against sexually transmitted infections.  Patient is in the process of establishing care with a new PCP in [place]. Patient recently followed up in clinic for sickle cell disease 1 month ago. - POCT urine pregnancy - medroxyPROGESTERone (DEPO-PROVERA) injection 150 mg  Follow-up: No follow-ups on file.  Patient is in the process of moving to [place], will follow-up with new PCP.  [facility] is assisting patient and locating PCP and hematologist in that area.  She will also connect with sickle cell services.  Donia Pounds  APRN,  MSN, FNP-C [facility] [address] [place], [place] [address] [phone]

## 2019-01-17 DIAGNOSIS — N938 Other specified abnormal uterine and vaginal bleeding: Secondary | ICD-10-CM | POA: Diagnosis not present

## 2019-01-17 DIAGNOSIS — M25512 Pain in left shoulder: Secondary | ICD-10-CM | POA: Diagnosis not present

## 2019-01-17 DIAGNOSIS — Z20828 Contact with and (suspected) exposure to other viral communicable diseases: Secondary | ICD-10-CM | POA: Diagnosis not present

## 2019-01-17 DIAGNOSIS — M791 Myalgia, unspecified site: Secondary | ICD-10-CM | POA: Diagnosis not present

## 2019-01-17 DIAGNOSIS — D571 Sickle-cell disease without crisis: Secondary | ICD-10-CM | POA: Diagnosis not present

## 2019-01-17 DIAGNOSIS — D57219 Sickle-cell/Hb-C disease with crisis, unspecified: Secondary | ICD-10-CM | POA: Diagnosis not present

## 2019-01-17 DIAGNOSIS — D5700 Hb-SS disease with crisis, unspecified: Secondary | ICD-10-CM | POA: Diagnosis not present

## 2019-01-18 ENCOUNTER — Ambulatory Visit: Payer: Medicare Other | Admitting: Obstetrics and Gynecology

## 2019-01-18 DIAGNOSIS — D5700 Hb-SS disease with crisis, unspecified: Secondary | ICD-10-CM | POA: Diagnosis not present

## 2019-01-18 DIAGNOSIS — E6609 Other obesity due to excess calories: Secondary | ICD-10-CM | POA: Diagnosis not present

## 2019-01-18 DIAGNOSIS — Z86718 Personal history of other venous thrombosis and embolism: Secondary | ICD-10-CM | POA: Diagnosis not present

## 2019-01-18 DIAGNOSIS — Z9049 Acquired absence of other specified parts of digestive tract: Secondary | ICD-10-CM | POA: Diagnosis not present

## 2019-01-18 DIAGNOSIS — Z6841 Body Mass Index (BMI) 40.0 and over, adult: Secondary | ICD-10-CM | POA: Diagnosis not present

## 2019-01-18 DIAGNOSIS — Z7901 Long term (current) use of anticoagulants: Secondary | ICD-10-CM | POA: Diagnosis not present

## 2019-01-18 DIAGNOSIS — Z91040 Latex allergy status: Secondary | ICD-10-CM | POA: Diagnosis not present

## 2019-01-18 DIAGNOSIS — Z79899 Other long term (current) drug therapy: Secondary | ICD-10-CM | POA: Diagnosis not present

## 2019-01-18 DIAGNOSIS — I10 Essential (primary) hypertension: Secondary | ICD-10-CM | POA: Diagnosis not present

## 2019-01-18 DIAGNOSIS — D571 Sickle-cell disease without crisis: Secondary | ICD-10-CM | POA: Diagnosis not present

## 2019-01-18 DIAGNOSIS — E119 Type 2 diabetes mellitus without complications: Secondary | ICD-10-CM | POA: Diagnosis not present

## 2019-01-18 DIAGNOSIS — Z20828 Contact with and (suspected) exposure to other viral communicable diseases: Secondary | ICD-10-CM | POA: Diagnosis not present

## 2019-01-18 DIAGNOSIS — M25512 Pain in left shoulder: Secondary | ICD-10-CM | POA: Diagnosis present

## 2019-01-18 DIAGNOSIS — D72829 Elevated white blood cell count, unspecified: Secondary | ICD-10-CM | POA: Diagnosis not present

## 2019-02-15 DIAGNOSIS — M87059 Idiopathic aseptic necrosis of unspecified femur: Secondary | ICD-10-CM | POA: Diagnosis not present

## 2019-02-15 DIAGNOSIS — R7889 Finding of other specified substances, not normally found in blood: Secondary | ICD-10-CM | POA: Diagnosis not present

## 2019-02-15 DIAGNOSIS — D571 Sickle-cell disease without crisis: Secondary | ICD-10-CM | POA: Diagnosis not present

## 2019-02-15 DIAGNOSIS — E119 Type 2 diabetes mellitus without complications: Secondary | ICD-10-CM | POA: Diagnosis not present

## 2019-02-15 DIAGNOSIS — Z0000 Encounter for general adult medical examination without abnormal findings: Secondary | ICD-10-CM | POA: Diagnosis not present

## 2019-02-15 DIAGNOSIS — F418 Other specified anxiety disorders: Secondary | ICD-10-CM | POA: Diagnosis not present

## 2019-02-15 DIAGNOSIS — E559 Vitamin D deficiency, unspecified: Secondary | ICD-10-CM | POA: Diagnosis not present

## 2019-02-15 DIAGNOSIS — G8929 Other chronic pain: Secondary | ICD-10-CM | POA: Diagnosis not present

## 2019-02-15 DIAGNOSIS — N920 Excessive and frequent menstruation with regular cycle: Secondary | ICD-10-CM | POA: Diagnosis not present

## 2019-02-15 DIAGNOSIS — Z87898 Personal history of other specified conditions: Secondary | ICD-10-CM | POA: Diagnosis not present

## 2019-03-11 DIAGNOSIS — R062 Wheezing: Secondary | ICD-10-CM | POA: Diagnosis not present

## 2019-03-11 DIAGNOSIS — J984 Other disorders of lung: Secondary | ICD-10-CM | POA: Diagnosis not present

## 2019-03-11 DIAGNOSIS — R071 Chest pain on breathing: Secondary | ICD-10-CM | POA: Diagnosis not present

## 2019-03-11 DIAGNOSIS — R0602 Shortness of breath: Secondary | ICD-10-CM | POA: Diagnosis not present

## 2019-03-15 DIAGNOSIS — I272 Pulmonary hypertension, unspecified: Secondary | ICD-10-CM | POA: Diagnosis not present

## 2019-03-15 DIAGNOSIS — D5700 Hb-SS disease with crisis, unspecified: Secondary | ICD-10-CM | POA: Diagnosis not present

## 2019-03-15 DIAGNOSIS — E559 Vitamin D deficiency, unspecified: Secondary | ICD-10-CM | POA: Diagnosis not present

## 2019-03-15 DIAGNOSIS — M87059 Idiopathic aseptic necrosis of unspecified femur: Secondary | ICD-10-CM | POA: Diagnosis not present

## 2019-03-15 DIAGNOSIS — R5383 Other fatigue: Secondary | ICD-10-CM | POA: Diagnosis not present

## 2019-03-15 DIAGNOSIS — D571 Sickle-cell disease without crisis: Secondary | ICD-10-CM | POA: Diagnosis not present

## 2019-03-15 DIAGNOSIS — G8929 Other chronic pain: Secondary | ICD-10-CM | POA: Diagnosis not present

## 2019-03-15 DIAGNOSIS — R5381 Other malaise: Secondary | ICD-10-CM | POA: Diagnosis not present

## 2019-03-15 DIAGNOSIS — H36 Retinal disorders in diseases classified elsewhere: Secondary | ICD-10-CM | POA: Diagnosis not present

## 2019-04-03 DIAGNOSIS — H3582 Retinal ischemia: Secondary | ICD-10-CM | POA: Diagnosis not present

## 2019-04-03 DIAGNOSIS — D571 Sickle-cell disease without crisis: Secondary | ICD-10-CM | POA: Diagnosis not present

## 2019-04-05 DIAGNOSIS — G8929 Other chronic pain: Secondary | ICD-10-CM | POA: Diagnosis not present

## 2019-04-05 DIAGNOSIS — D571 Sickle-cell disease without crisis: Secondary | ICD-10-CM | POA: Diagnosis not present

## 2019-04-05 DIAGNOSIS — Z23 Encounter for immunization: Secondary | ICD-10-CM | POA: Diagnosis not present

## 2019-04-05 DIAGNOSIS — E559 Vitamin D deficiency, unspecified: Secondary | ICD-10-CM | POA: Diagnosis not present

## 2019-04-24 DIAGNOSIS — Z01419 Encounter for gynecological examination (general) (routine) without abnormal findings: Secondary | ICD-10-CM | POA: Diagnosis not present

## 2019-04-25 DIAGNOSIS — E559 Vitamin D deficiency, unspecified: Secondary | ICD-10-CM | POA: Diagnosis not present

## 2019-04-25 DIAGNOSIS — Z0283 Encounter for blood-alcohol and blood-drug test: Secondary | ICD-10-CM | POA: Diagnosis not present

## 2019-04-25 DIAGNOSIS — D571 Sickle-cell disease without crisis: Secondary | ICD-10-CM | POA: Diagnosis not present

## 2019-04-25 DIAGNOSIS — G8929 Other chronic pain: Secondary | ICD-10-CM | POA: Diagnosis not present

## 2019-05-07 DIAGNOSIS — F1111 Opioid abuse, in remission: Secondary | ICD-10-CM | POA: Diagnosis present

## 2019-05-07 DIAGNOSIS — R918 Other nonspecific abnormal finding of lung field: Secondary | ICD-10-CM | POA: Diagnosis not present

## 2019-05-07 DIAGNOSIS — E860 Dehydration: Secondary | ICD-10-CM | POA: Diagnosis not present

## 2019-05-07 DIAGNOSIS — M87859 Other osteonecrosis, unspecified femur: Secondary | ICD-10-CM | POA: Diagnosis not present

## 2019-05-07 DIAGNOSIS — F633 Trichotillomania: Secondary | ICD-10-CM | POA: Diagnosis present

## 2019-05-07 DIAGNOSIS — Z0389 Encounter for observation for other suspected diseases and conditions ruled out: Secondary | ICD-10-CM | POA: Diagnosis not present

## 2019-05-07 DIAGNOSIS — F319 Bipolar disorder, unspecified: Secondary | ICD-10-CM | POA: Diagnosis present

## 2019-05-07 DIAGNOSIS — M25512 Pain in left shoulder: Secondary | ICD-10-CM | POA: Diagnosis not present

## 2019-05-07 DIAGNOSIS — D5701 Hb-SS disease with acute chest syndrome: Secondary | ICD-10-CM | POA: Diagnosis not present

## 2019-05-07 DIAGNOSIS — D5700 Hb-SS disease with crisis, unspecified: Secondary | ICD-10-CM | POA: Diagnosis not present

## 2019-05-07 DIAGNOSIS — F411 Generalized anxiety disorder: Secondary | ICD-10-CM | POA: Diagnosis present

## 2019-05-07 DIAGNOSIS — M87812 Other osteonecrosis, left shoulder: Secondary | ICD-10-CM | POA: Diagnosis not present

## 2019-05-07 DIAGNOSIS — Z6837 Body mass index (BMI) 37.0-37.9, adult: Secondary | ICD-10-CM | POA: Diagnosis not present

## 2019-05-07 DIAGNOSIS — F1411 Cocaine abuse, in remission: Secondary | ICD-10-CM | POA: Diagnosis present

## 2019-05-07 DIAGNOSIS — R0989 Other specified symptoms and signs involving the circulatory and respiratory systems: Secondary | ICD-10-CM | POA: Diagnosis not present

## 2019-05-07 DIAGNOSIS — D473 Essential (hemorrhagic) thrombocythemia: Secondary | ICD-10-CM | POA: Diagnosis not present

## 2019-05-07 DIAGNOSIS — E669 Obesity, unspecified: Secondary | ICD-10-CM | POA: Diagnosis not present

## 2019-05-07 DIAGNOSIS — Z20828 Contact with and (suspected) exposure to other viral communicable diseases: Secondary | ICD-10-CM | POA: Diagnosis present

## 2019-05-08 DIAGNOSIS — D5700 Hb-SS disease with crisis, unspecified: Secondary | ICD-10-CM | POA: Diagnosis not present

## 2019-05-08 DIAGNOSIS — M25512 Pain in left shoulder: Secondary | ICD-10-CM | POA: Diagnosis not present

## 2019-05-09 DIAGNOSIS — M25512 Pain in left shoulder: Secondary | ICD-10-CM | POA: Diagnosis not present

## 2019-05-09 DIAGNOSIS — R0989 Other specified symptoms and signs involving the circulatory and respiratory systems: Secondary | ICD-10-CM | POA: Diagnosis not present

## 2019-05-09 DIAGNOSIS — D5700 Hb-SS disease with crisis, unspecified: Secondary | ICD-10-CM | POA: Diagnosis not present

## 2019-05-10 DIAGNOSIS — M25512 Pain in left shoulder: Secondary | ICD-10-CM | POA: Diagnosis not present

## 2019-05-10 DIAGNOSIS — D5700 Hb-SS disease with crisis, unspecified: Secondary | ICD-10-CM | POA: Diagnosis not present

## 2019-05-11 DIAGNOSIS — M25512 Pain in left shoulder: Secondary | ICD-10-CM | POA: Diagnosis not present

## 2019-05-11 DIAGNOSIS — D5700 Hb-SS disease with crisis, unspecified: Secondary | ICD-10-CM | POA: Diagnosis not present

## 2019-05-12 DIAGNOSIS — D5700 Hb-SS disease with crisis, unspecified: Secondary | ICD-10-CM | POA: Diagnosis not present

## 2019-05-12 DIAGNOSIS — M25512 Pain in left shoulder: Secondary | ICD-10-CM | POA: Diagnosis not present

## 2019-05-13 DIAGNOSIS — D5700 Hb-SS disease with crisis, unspecified: Secondary | ICD-10-CM | POA: Diagnosis not present

## 2019-05-13 DIAGNOSIS — M25512 Pain in left shoulder: Secondary | ICD-10-CM | POA: Diagnosis not present

## 2019-06-06 DIAGNOSIS — R0602 Shortness of breath: Secondary | ICD-10-CM | POA: Diagnosis not present

## 2019-06-06 DIAGNOSIS — Z743 Need for continuous supervision: Secondary | ICD-10-CM | POA: Diagnosis not present

## 2019-06-06 DIAGNOSIS — D5700 Hb-SS disease with crisis, unspecified: Secondary | ICD-10-CM | POA: Diagnosis not present

## 2019-06-06 DIAGNOSIS — Z20822 Contact with and (suspected) exposure to covid-19: Secondary | ICD-10-CM | POA: Diagnosis not present

## 2019-06-06 DIAGNOSIS — R079 Chest pain, unspecified: Secondary | ICD-10-CM | POA: Diagnosis not present

## 2019-06-07 DIAGNOSIS — R9431 Abnormal electrocardiogram [ECG] [EKG]: Secondary | ICD-10-CM | POA: Diagnosis not present

## 2019-06-07 DIAGNOSIS — D571 Sickle-cell disease without crisis: Secondary | ICD-10-CM | POA: Diagnosis not present

## 2019-06-07 DIAGNOSIS — D5700 Hb-SS disease with crisis, unspecified: Secondary | ICD-10-CM | POA: Diagnosis not present

## 2019-06-07 DIAGNOSIS — A419 Sepsis, unspecified organism: Secondary | ICD-10-CM | POA: Diagnosis not present

## 2019-06-07 DIAGNOSIS — R0602 Shortness of breath: Secondary | ICD-10-CM | POA: Diagnosis not present

## 2019-06-07 DIAGNOSIS — R079 Chest pain, unspecified: Secondary | ICD-10-CM | POA: Diagnosis not present

## 2019-06-08 DIAGNOSIS — A419 Sepsis, unspecified organism: Secondary | ICD-10-CM | POA: Diagnosis not present

## 2019-06-08 DIAGNOSIS — J189 Pneumonia, unspecified organism: Secondary | ICD-10-CM | POA: Diagnosis not present

## 2019-06-08 DIAGNOSIS — D571 Sickle-cell disease without crisis: Secondary | ICD-10-CM | POA: Diagnosis not present

## 2019-06-09 DIAGNOSIS — A419 Sepsis, unspecified organism: Secondary | ICD-10-CM | POA: Diagnosis not present

## 2019-06-09 DIAGNOSIS — J189 Pneumonia, unspecified organism: Secondary | ICD-10-CM | POA: Diagnosis not present

## 2019-06-09 DIAGNOSIS — D571 Sickle-cell disease without crisis: Secondary | ICD-10-CM | POA: Diagnosis not present

## 2019-06-10 DIAGNOSIS — A419 Sepsis, unspecified organism: Secondary | ICD-10-CM | POA: Diagnosis not present

## 2019-06-10 DIAGNOSIS — J189 Pneumonia, unspecified organism: Secondary | ICD-10-CM | POA: Diagnosis not present

## 2019-06-10 DIAGNOSIS — D571 Sickle-cell disease without crisis: Secondary | ICD-10-CM | POA: Diagnosis not present

## 2019-06-11 DIAGNOSIS — J90 Pleural effusion, not elsewhere classified: Secondary | ICD-10-CM | POA: Diagnosis not present

## 2019-06-11 DIAGNOSIS — J189 Pneumonia, unspecified organism: Secondary | ICD-10-CM | POA: Diagnosis not present

## 2019-06-14 DIAGNOSIS — D5700 Hb-SS disease with crisis, unspecified: Secondary | ICD-10-CM | POA: Diagnosis not present

## 2019-06-14 DIAGNOSIS — R112 Nausea with vomiting, unspecified: Secondary | ICD-10-CM | POA: Diagnosis not present

## 2019-06-14 DIAGNOSIS — R0602 Shortness of breath: Secondary | ICD-10-CM | POA: Diagnosis not present

## 2019-06-14 DIAGNOSIS — Z09 Encounter for follow-up examination after completed treatment for conditions other than malignant neoplasm: Secondary | ICD-10-CM | POA: Diagnosis not present

## 2019-06-14 DIAGNOSIS — D571 Sickle-cell disease without crisis: Secondary | ICD-10-CM | POA: Diagnosis not present

## 2019-06-14 DIAGNOSIS — G8929 Other chronic pain: Secondary | ICD-10-CM | POA: Diagnosis not present

## 2019-07-11 NOTE — Telephone Encounter (Signed)
error 

## 2019-07-19 DIAGNOSIS — D571 Sickle-cell disease without crisis: Secondary | ICD-10-CM | POA: Diagnosis not present

## 2019-07-19 DIAGNOSIS — E559 Vitamin D deficiency, unspecified: Secondary | ICD-10-CM | POA: Diagnosis not present

## 2019-09-06 IMAGING — DX RIGHT SHOULDER - 2+ VIEW
2 series · 2 of 2 positions shown · non-contrast
Comparison: Radiographs October 06, 2016.

CLINICAL DATA: Right arm pain without known injury. History of
sickle cell disease.

EXAM:
RIGHT SHOULDER - 2+ VIEW

[shoulder obl]
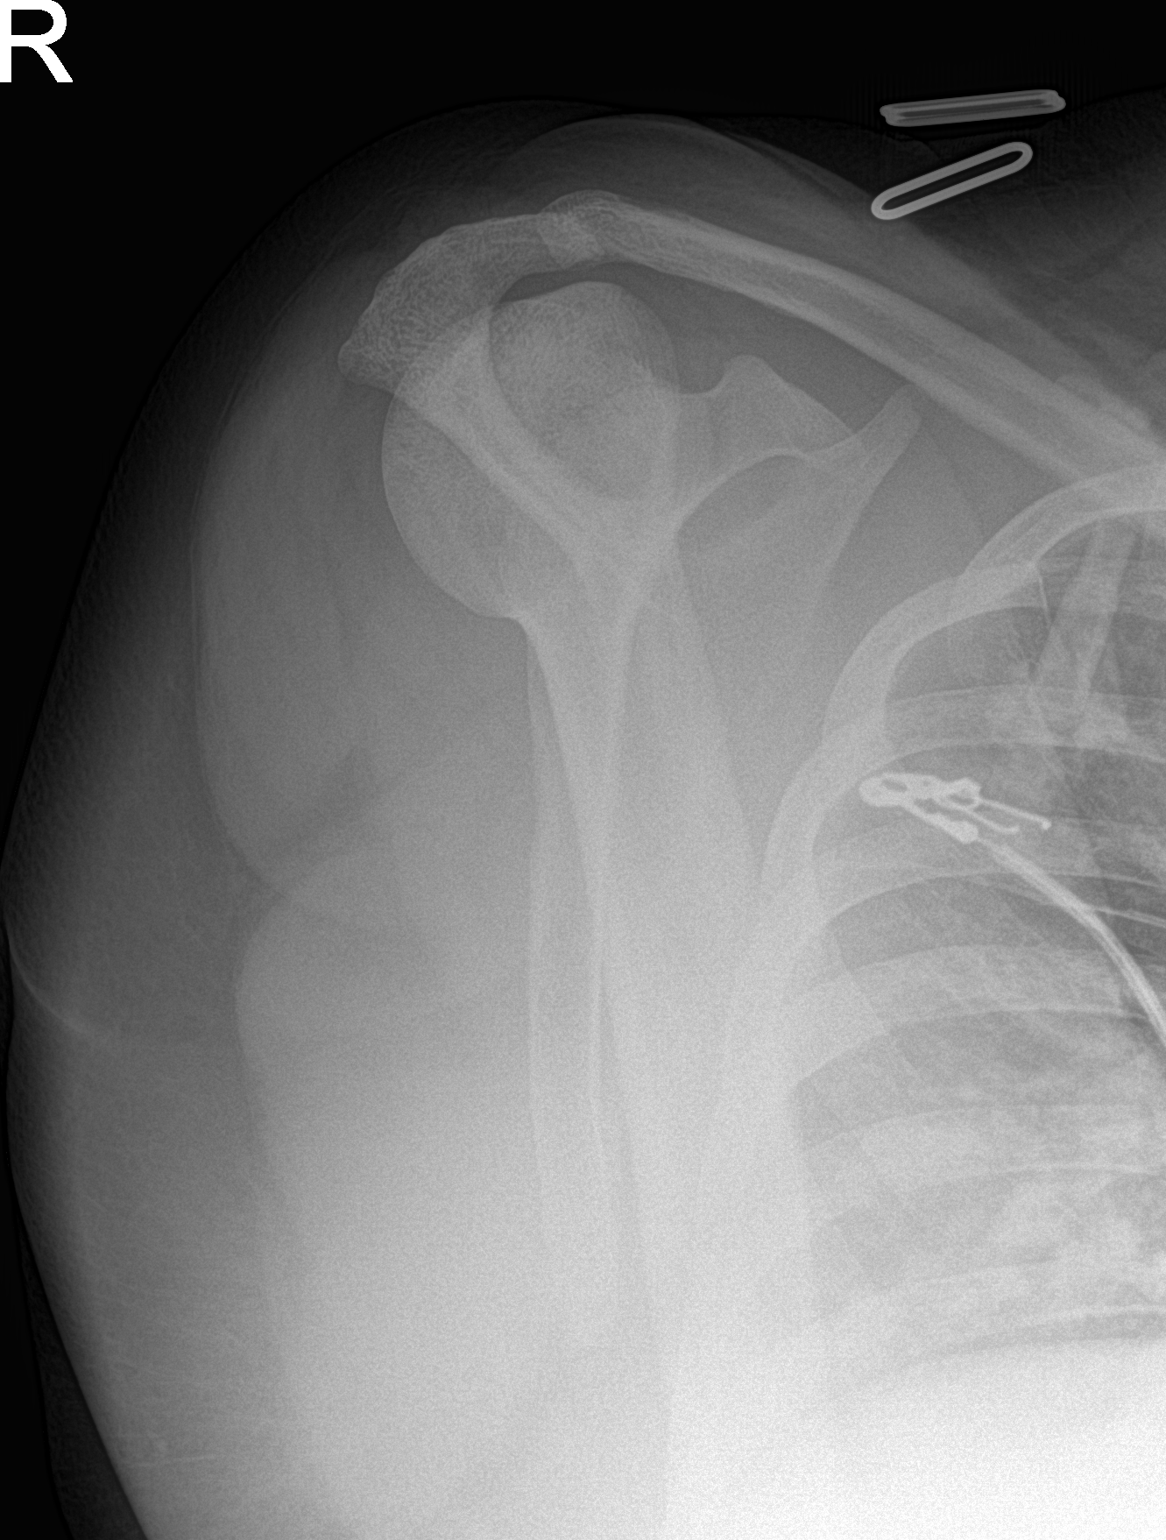

[shoulder ap]
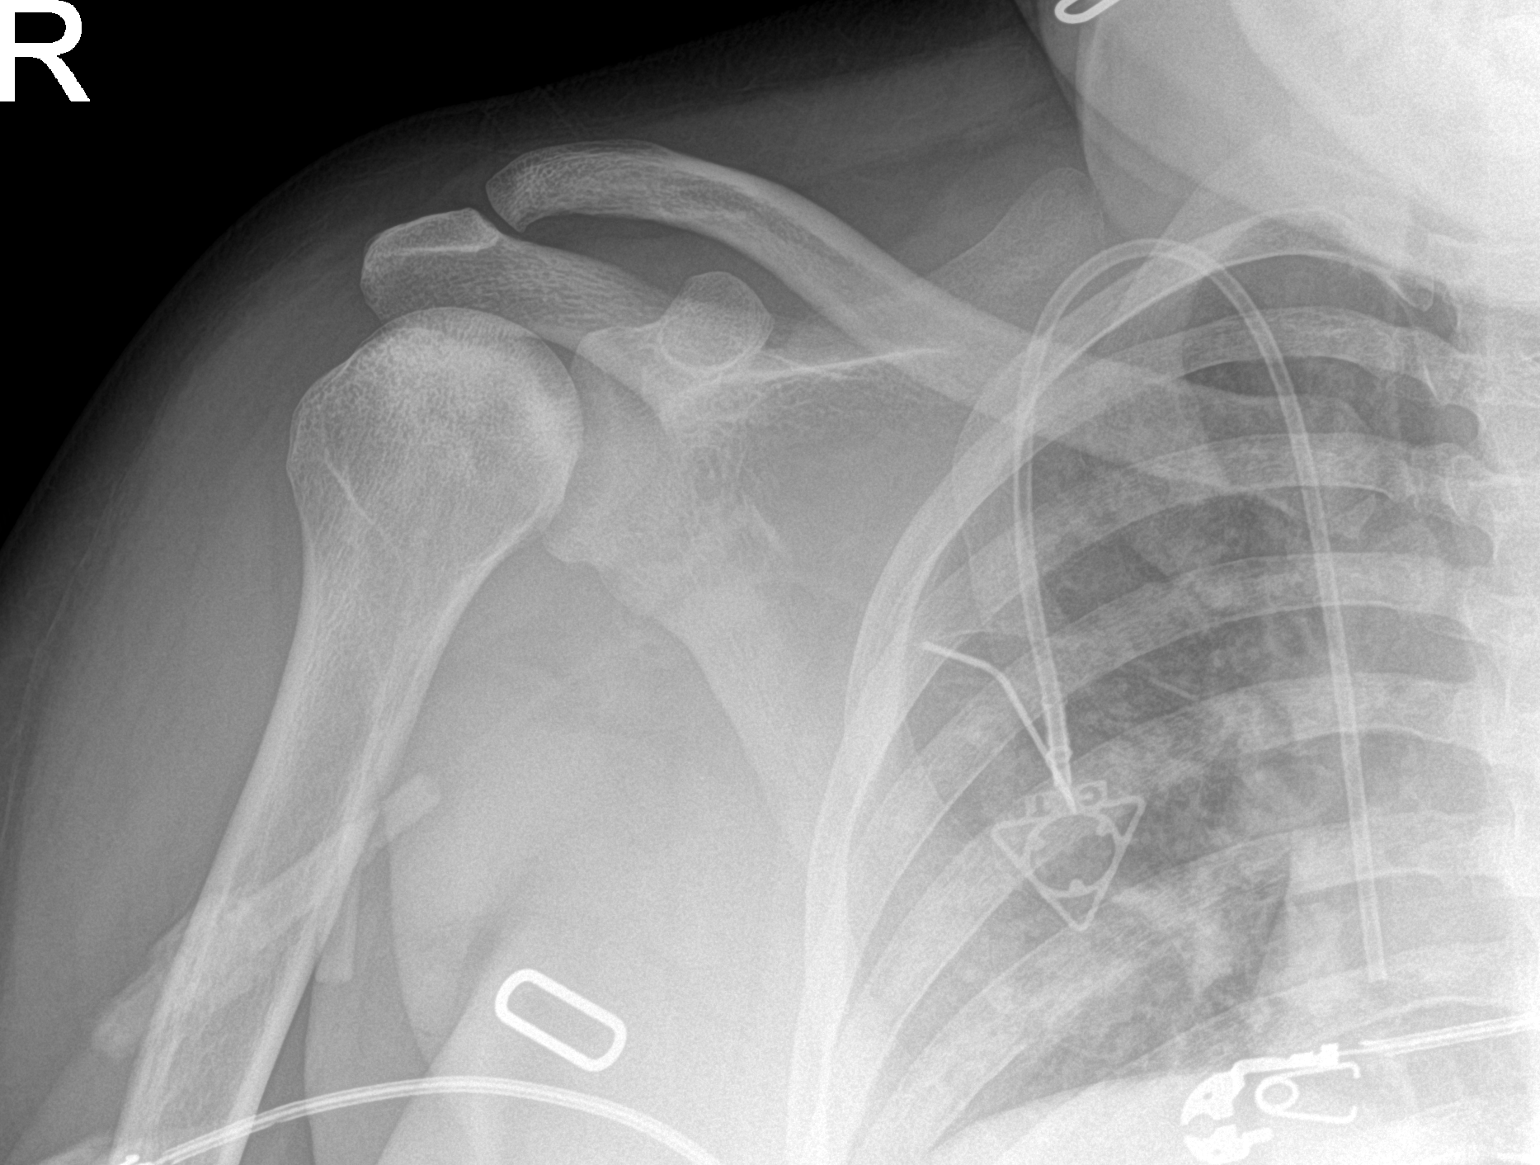

[2 of 2 positions shown; findings below may reference images not displayed]

FINDINGS: There is no evidence of fracture or dislocation. There is no
evidence of arthropathy. Stable sclerosis is seen in right humeral
head concerning for avascular necrosis. Soft tissues are
unremarkable.
IMPRESSION: Stable sclerosis seen in right humeral head consistent with
avascular necrosis. No acute abnormality is noted.

## 2019-09-06 IMAGING — DX PORTABLE CHEST - 1 VIEW
1 series · 1 of 1 positions shown · non-contrast
Comparison: 11/19/2017

CLINICAL DATA: Pt is c/o sickle cell pain to chest and right arm.
Smoker.

EXAM:
PORTABLE CHEST - 1 VIEW

[chest ap]
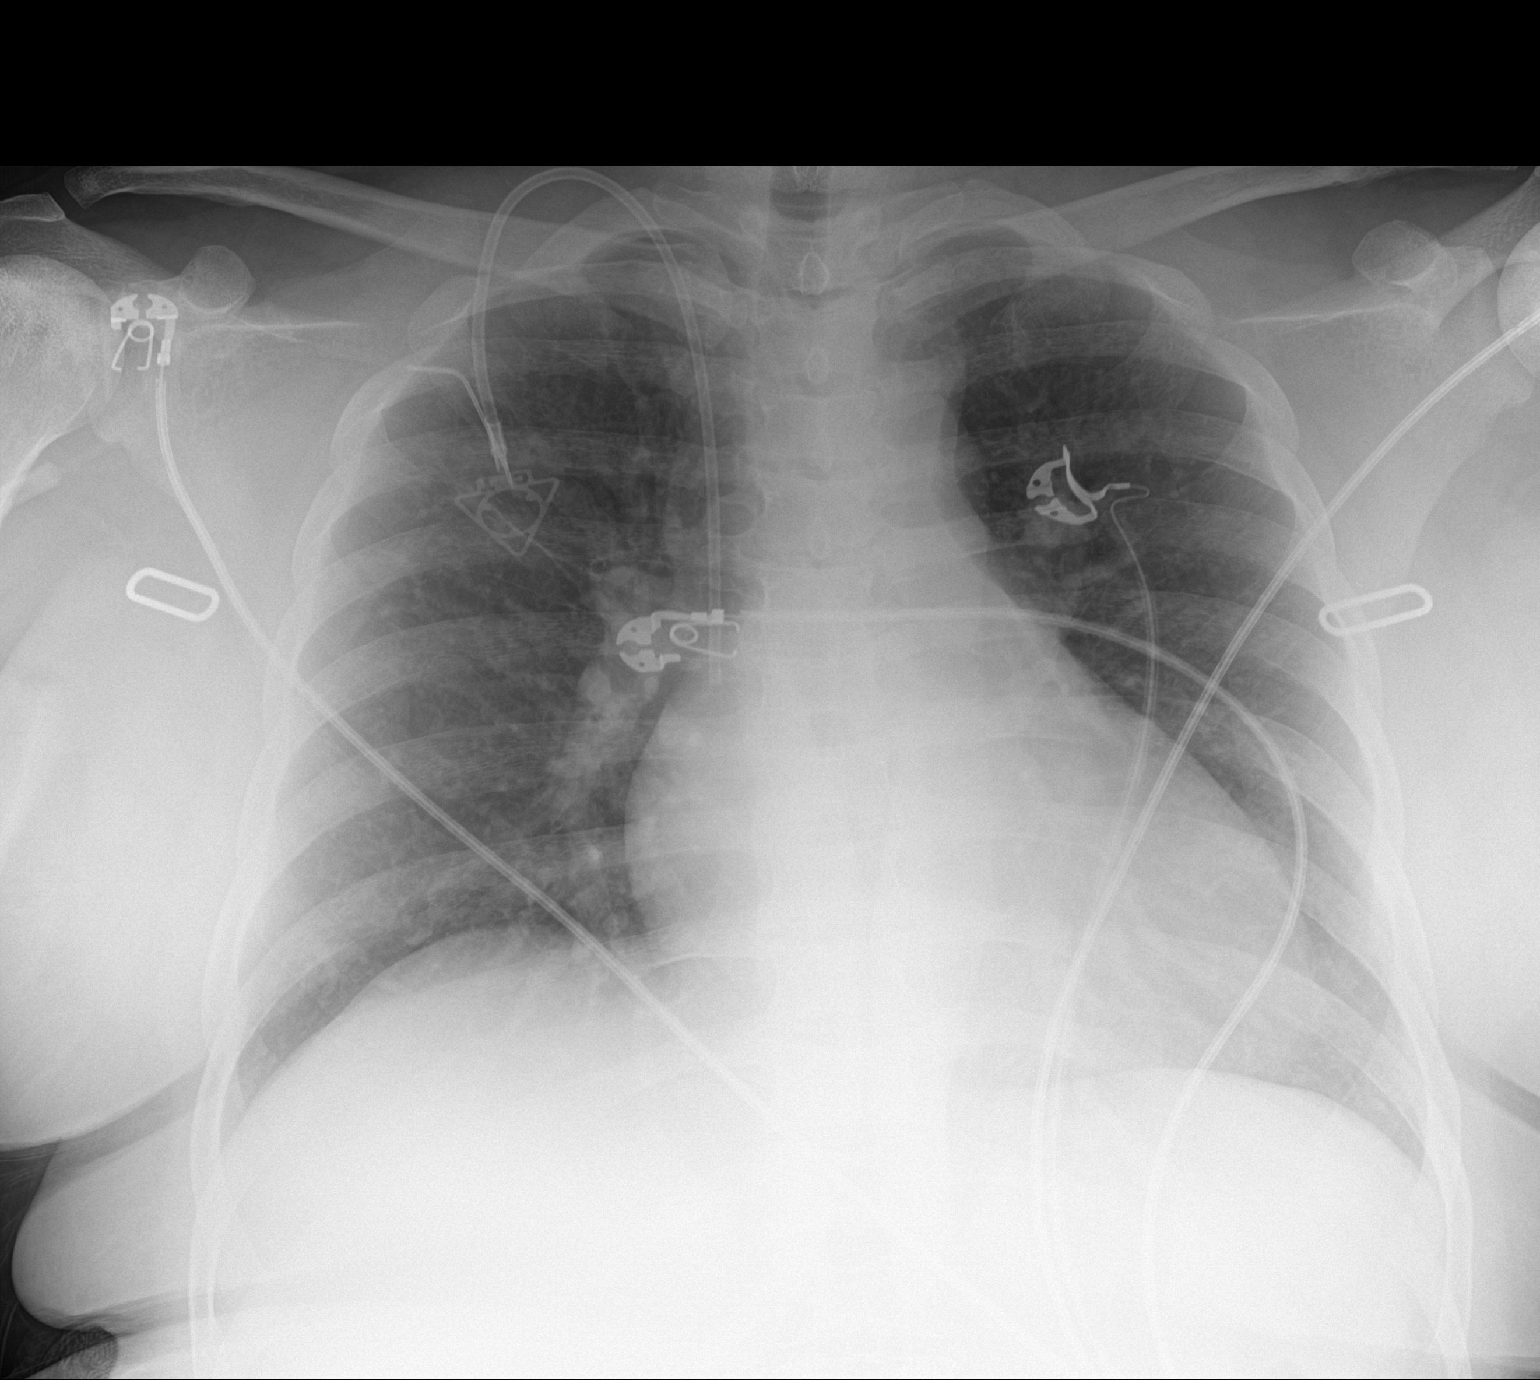

[1 of 1 positions shown; findings below may reference images not displayed]

FINDINGS: Stable right IJ power port.  Lungs are clear.

Mild cardiomegaly.

No effusion.  No pneumothorax.

Visualized bones unremarkable.
IMPRESSION: No acute cardiopulmonary disease.

## 2019-09-14 IMAGING — CT CT RENAL STONE PROTOCOL
2 of 4 series · 16 of 46 positions shown, 18 images · non-contrast
Comparison: 07/04/2017

CLINICAL DATA: Bilateral flank pain, left greater than right.
Sickle cell anemia.

EXAM:
CT ABDOMEN AND PELVIS WITHOUT CONTRAST
TECHNIQUE: Multidetector CT imaging of the abdomen and pelvis was performed
following the standard protocol without IV contrast.

[Series 2: axial st · axial · 0.65mm/px · z∈[-485,-95]mm · 13 of 88 slices shown, 15 images]
[im 5/88  soft-tissue]
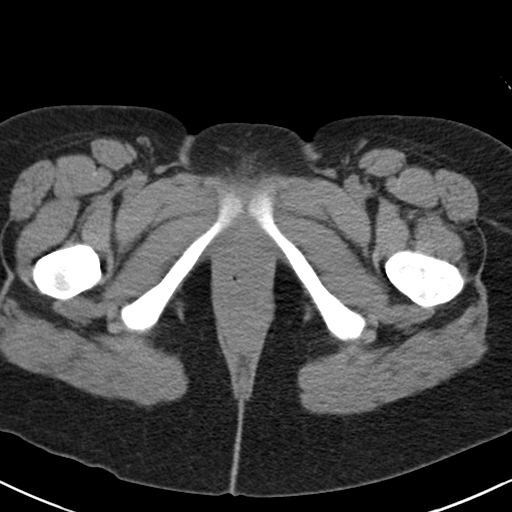
[im 5/88  bone]
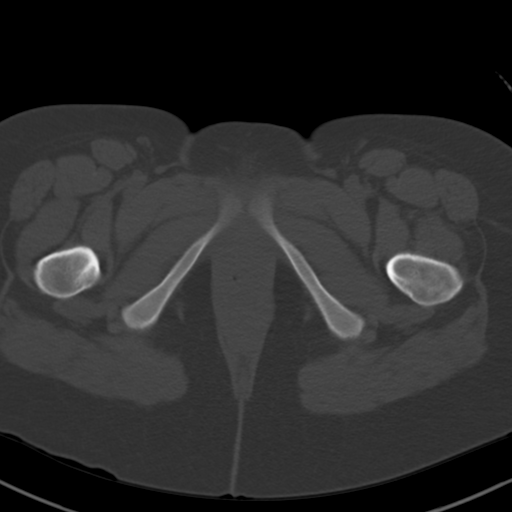
[im 10/88  soft-tissue]
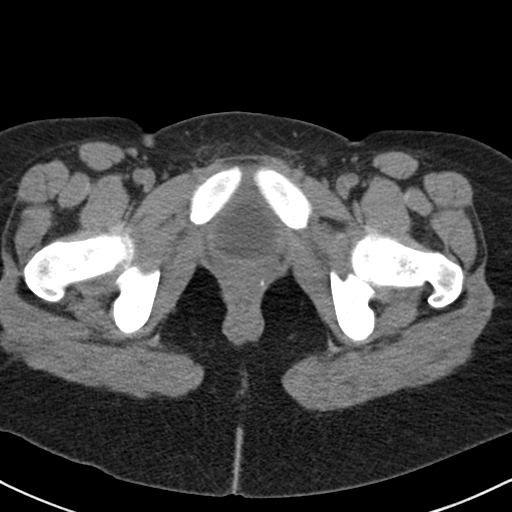
[im 20/88  soft-tissue]
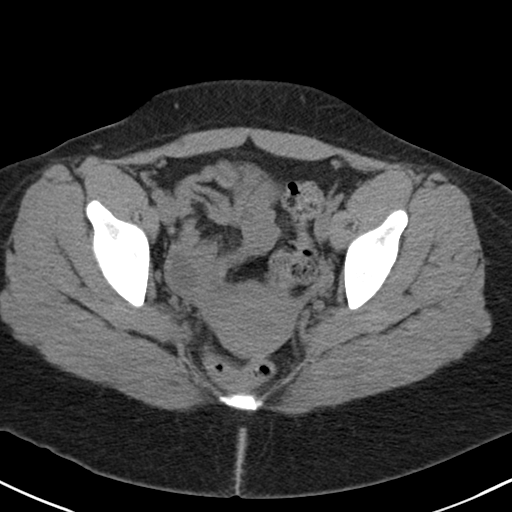
[im 25/88  soft-tissue]
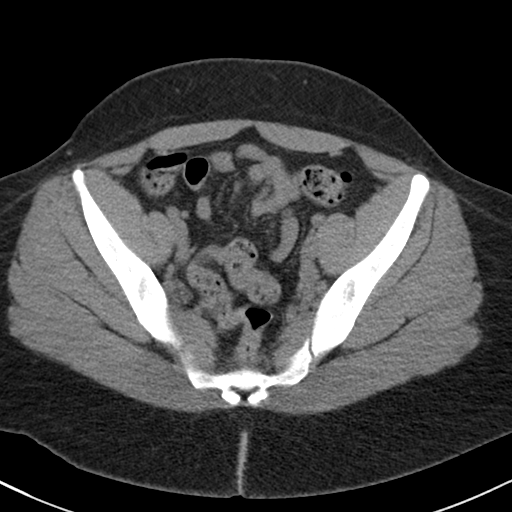
[im 30/88  soft-tissue]
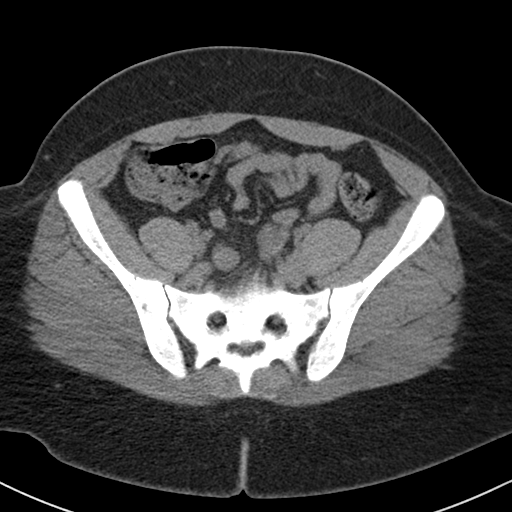
[im 39/88  soft-tissue]
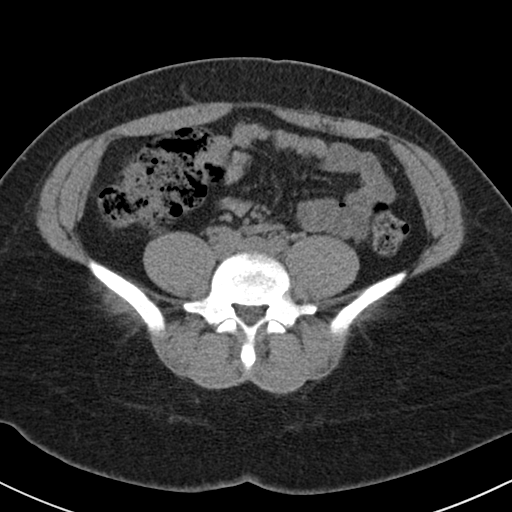
[im 44/88  soft-tissue]
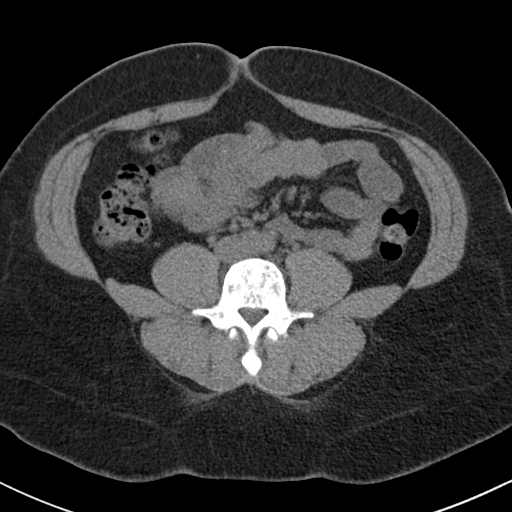
[im 49/88  soft-tissue]
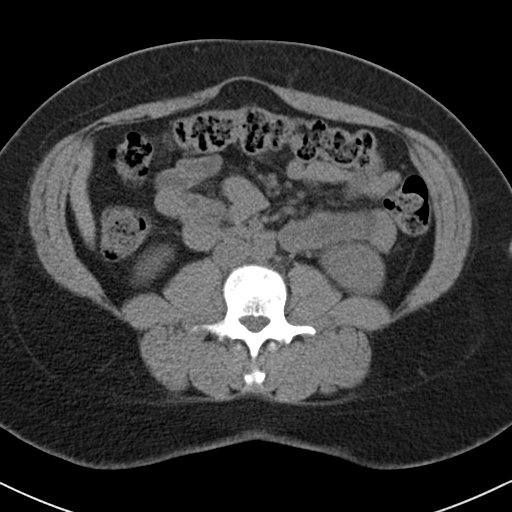
[im 59/88  soft-tissue]
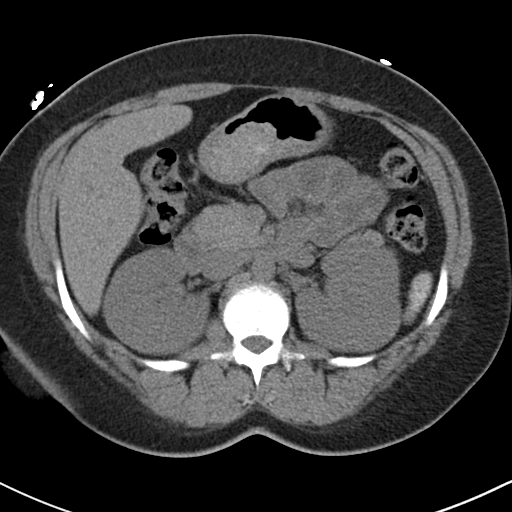
[im 59/88  bone]
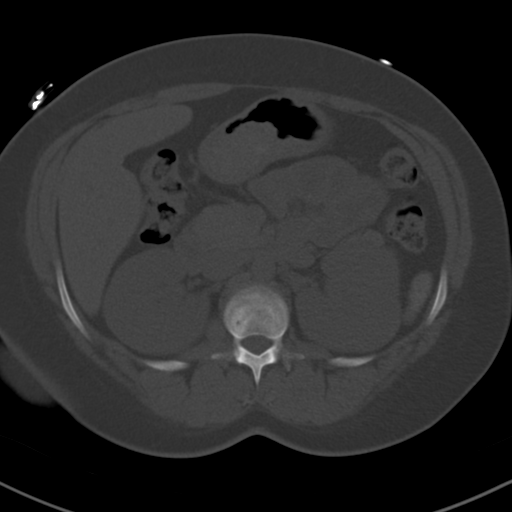
[im 63/88  soft-tissue]
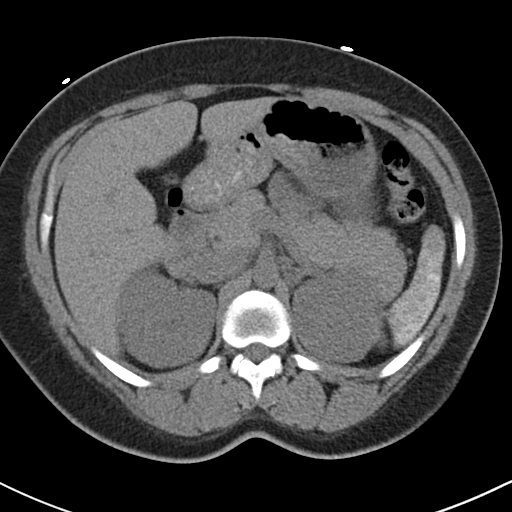
[im 68/88  soft-tissue]
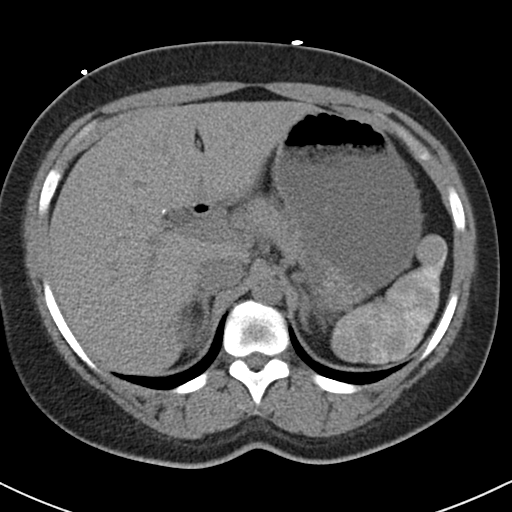
[im 78/88  soft-tissue]
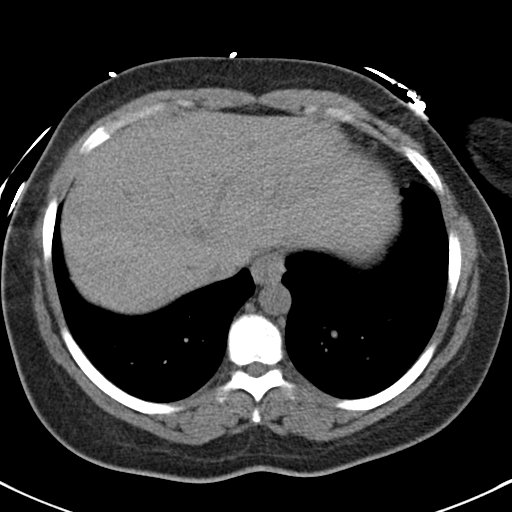
[im 83/88  soft-tissue]
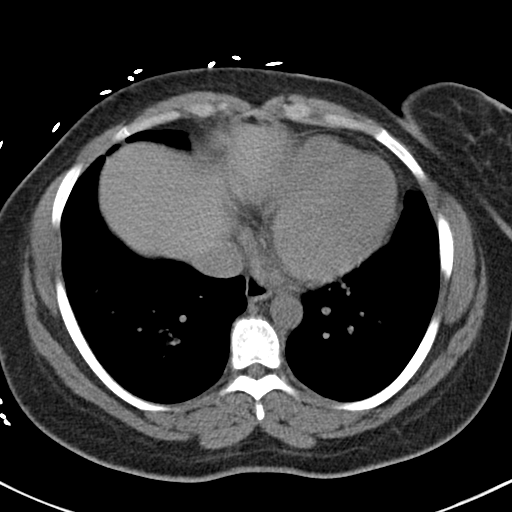

[Series 5: coronal · coronal · 0.74mm/px · 3 of 142 slices shown]
[im 48/142  soft-tissue]
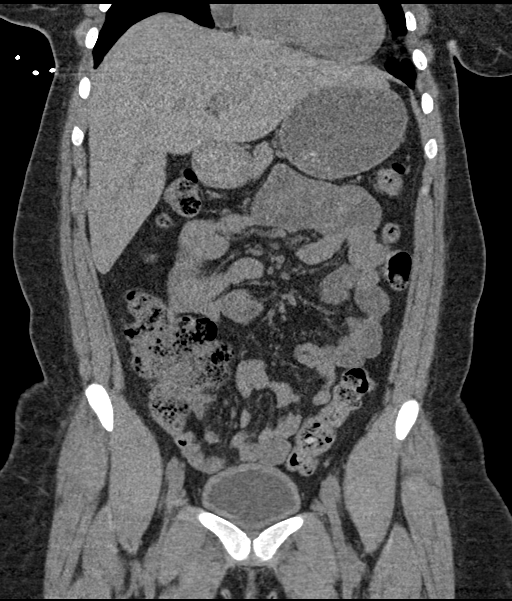
[im 63/142  soft-tissue]
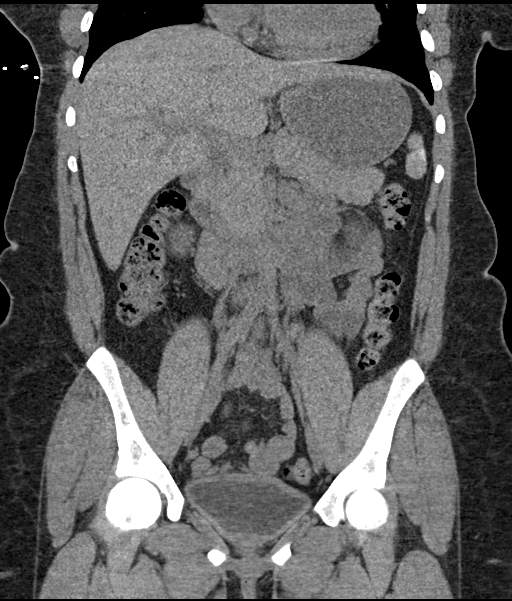
[im 79/142  soft-tissue]
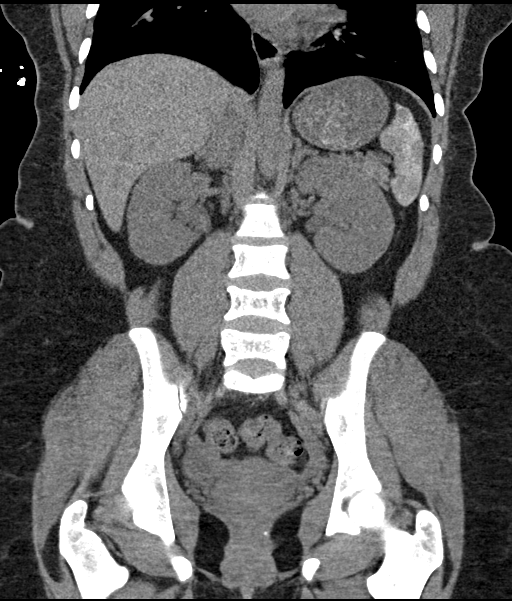

[16 of 46 positions shown; findings below may reference images not displayed]

FINDINGS: Lower chest: Lung bases are clear. No effusions. Heart is normal
size.

Hepatobiliary: No focal liver abnormality is seen. Status post
cholecystectomy. No biliary dilatation.

Pancreas: No focal abnormality or ductal dilatation.

Spleen: Lobular contours throughout the spleen with multiple cystic
areas, stable compared to multiple prior studies.

Adrenals/Urinary Tract: No adrenal abnormality. No focal renal
abnormality. No stones or hydronephrosis. Urinary bladder is
unremarkable.

Stomach/Bowel: Stomach, large and small bowel grossly unremarkable.

Vascular/Lymphatic: Normal caliber aorta. Mildly prominent right
lower quadrant mesenteric lymph nodes. No retroperitoneal
adenopathy.

Reproductive: Uterus and adnexa unremarkable.  No mass.

Other: No free fluid or free air.

Musculoskeletal: Areas of sclerosis and lucency within the bony
structures compatible with sickle cell changes.
IMPRESSION: No evidence of renal or ureteral stones.  No hydronephrosis.

Stable appearance of the spleen with lobular contours and numerous
hypodensities throughout the spleen.

Prior cholecystectomy.

Bony changes of sickle cell disease.

## 2019-10-05 IMAGING — DX PORTABLE CHEST - 1 VIEW
1 series · 1 of 1 positions shown · non-contrast
Comparison: October 22, 2018

CLINICAL DATA: Back pain

EXAM:
PORTABLE CHEST 1 VIEW

[chest ap]
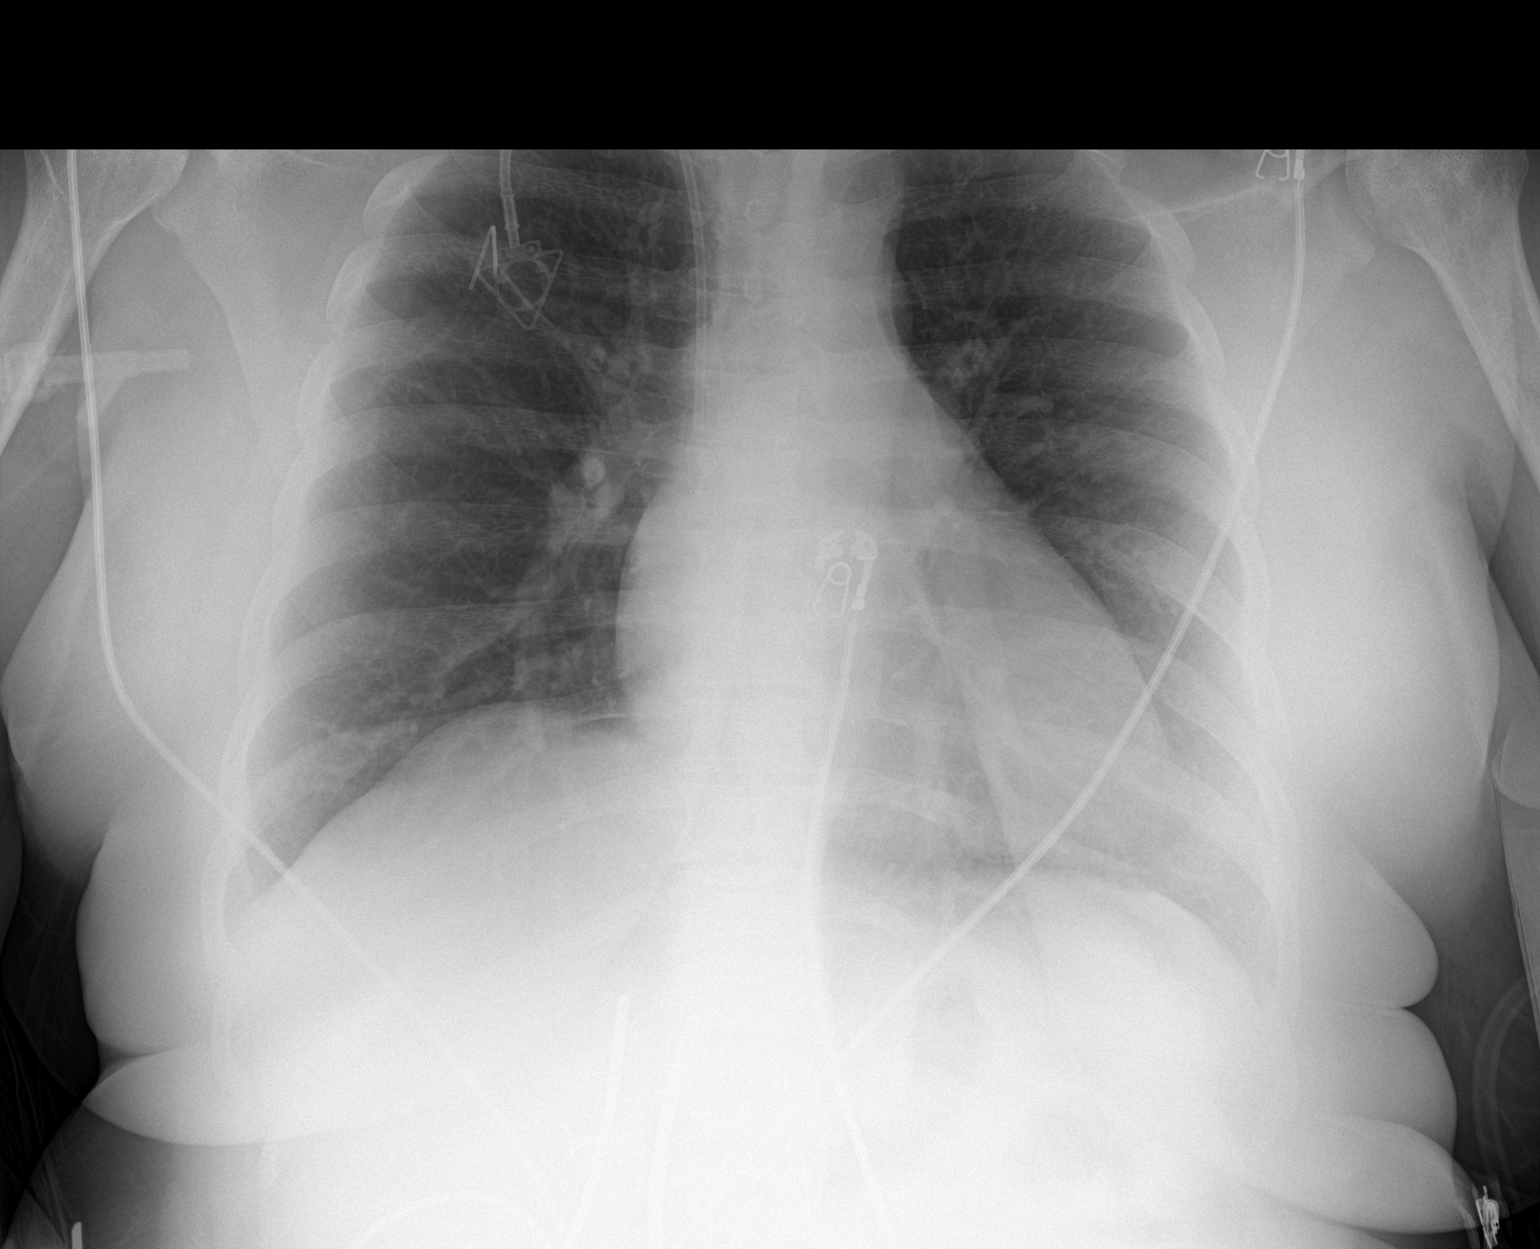

[1 of 1 positions shown; findings below may reference images not displayed]

FINDINGS: There is a stable right-sided Port-A-Cath. The lungs are essentially
clear. The heart size is stable. A vascular necrosis is noted of
both humeral heads. There is no large pleural effusion. No acute
osseous abnormality.
IMPRESSION: No active disease.

## 2019-10-24 IMAGING — US US TRANSVAGINAL NON-OB
1 series · 14 of 25 positions shown · non-contrast
Comparison: 04/03/2016

CLINICAL DATA: Menorrhagia

EXAM:
ULTRASOUND PELVIS TRANSVAGINAL
TECHNIQUE: Transvaginal ultrasound examination of the pelvis was performed
including evaluation of the uterus, ovaries, adnexal regions, and
pelvic cul-de-sac.

[Series 1: us transvaginal non-ob · 0.10mm/px · 84 acquisitions, 14 frames shown]
[im 1/84]
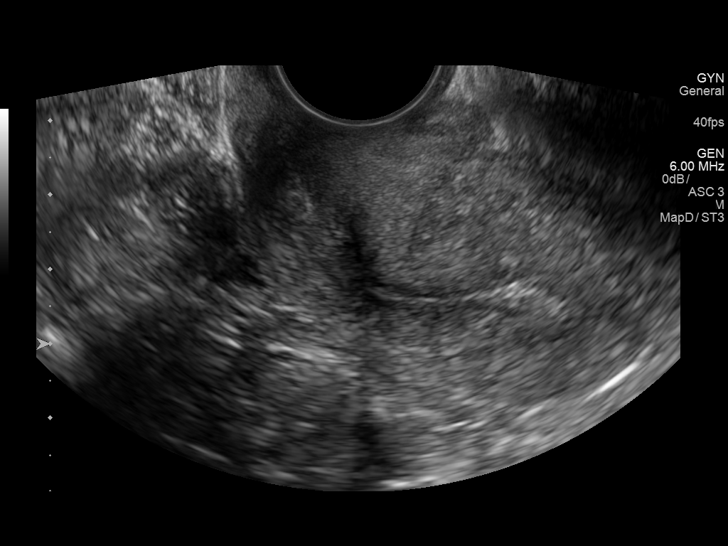
[im 7/84]
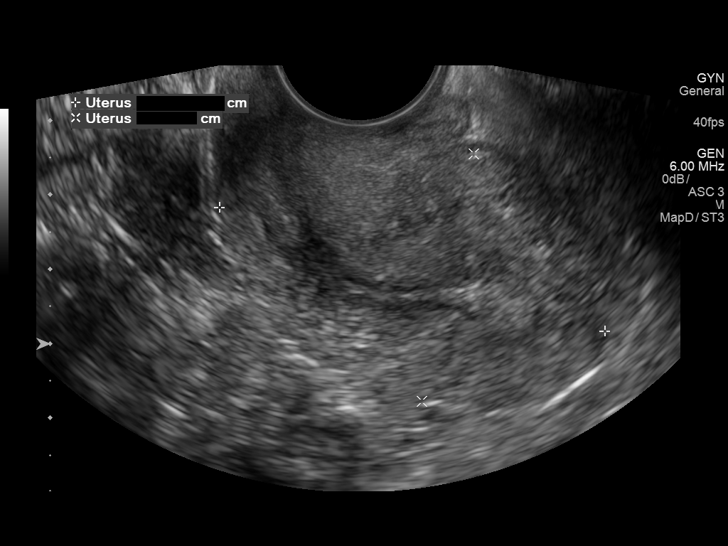
[im 14/84]
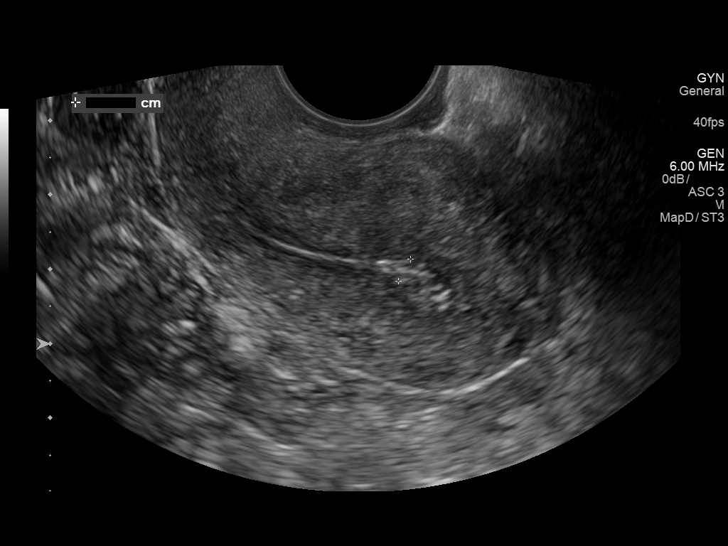
[im 21/84]
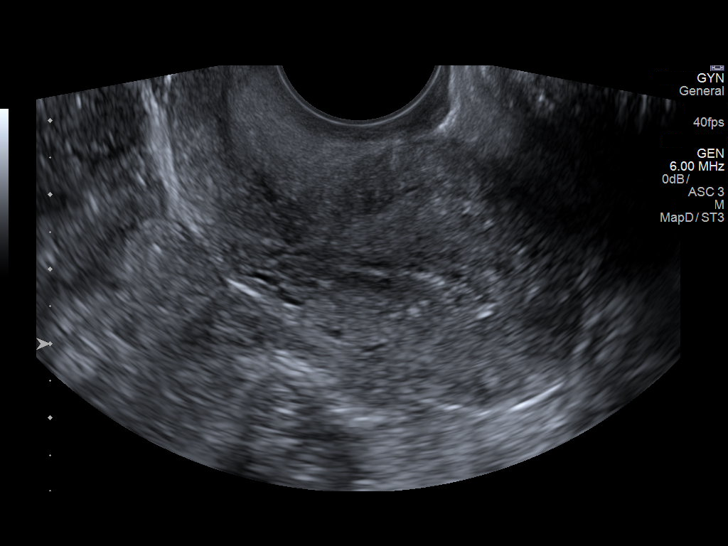
[im 28/84]
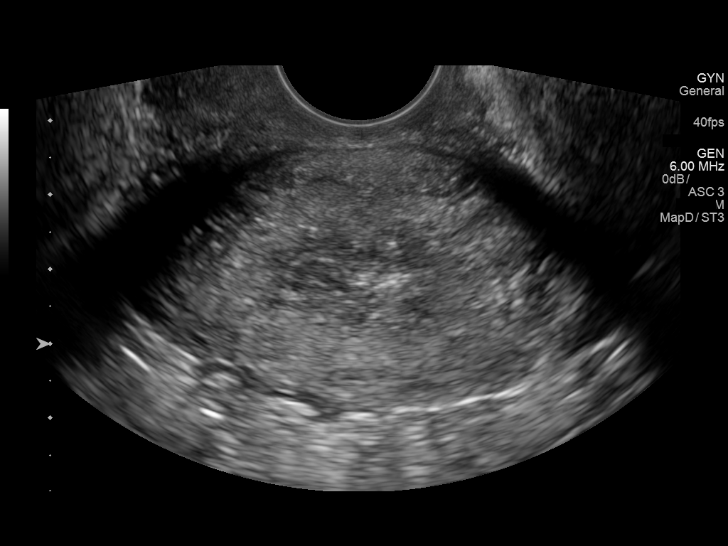
[im 32/84]
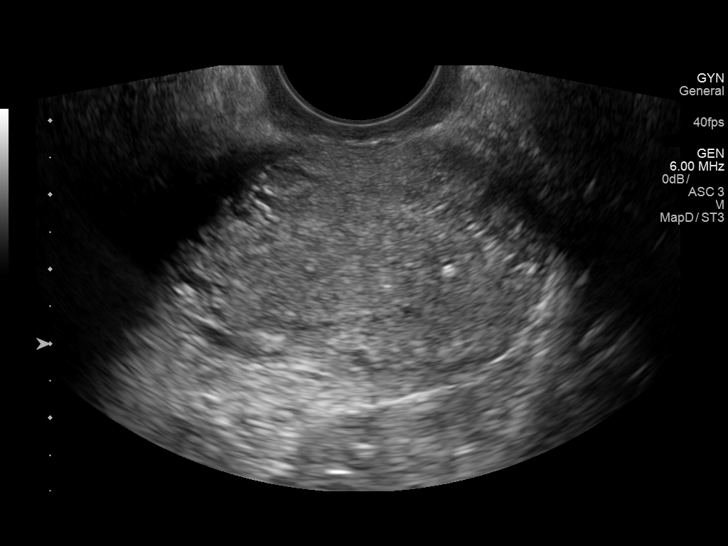
[im 39/84]
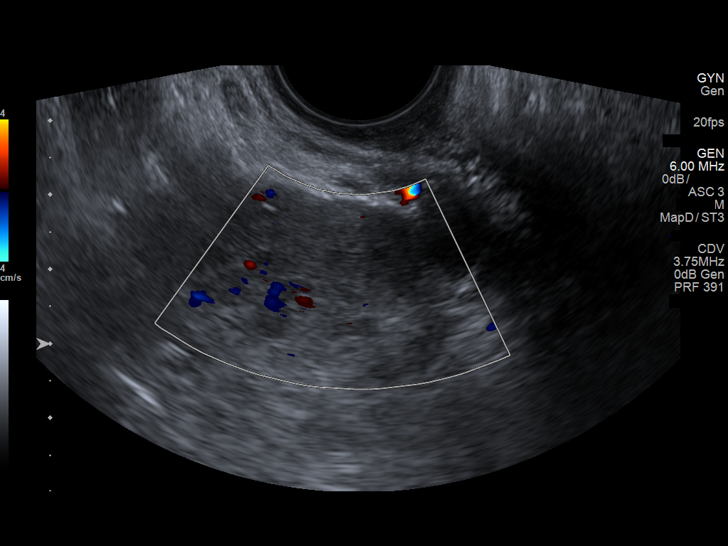
[im 45/84]
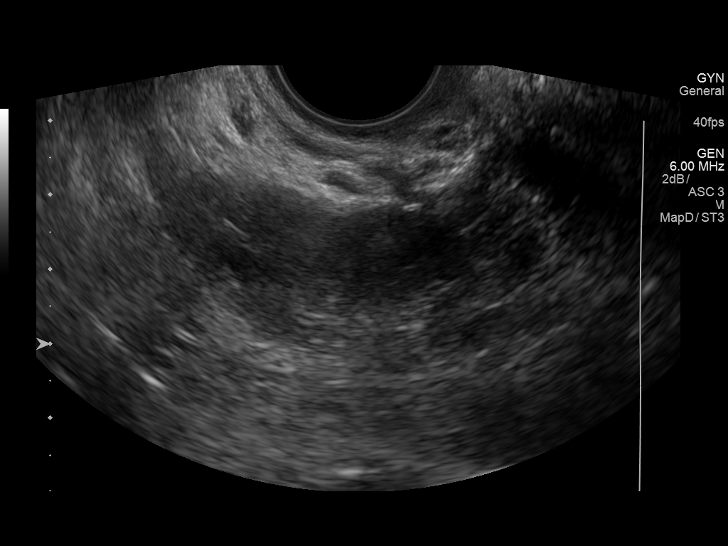
[im 52/84]
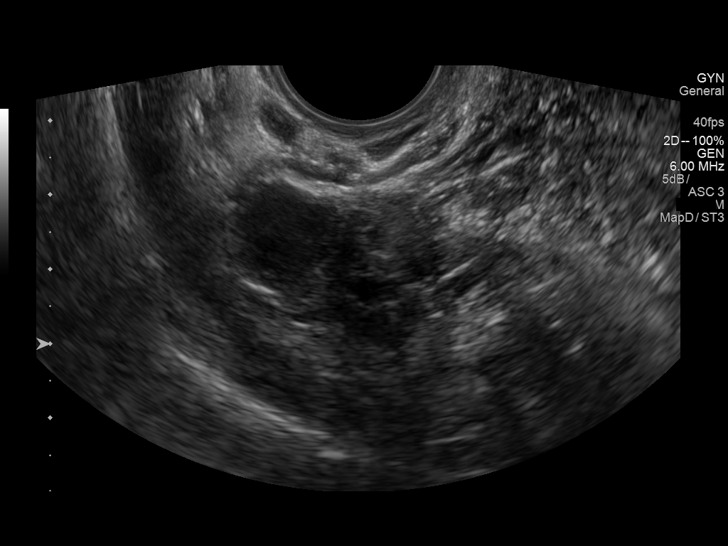
[im 56/84]
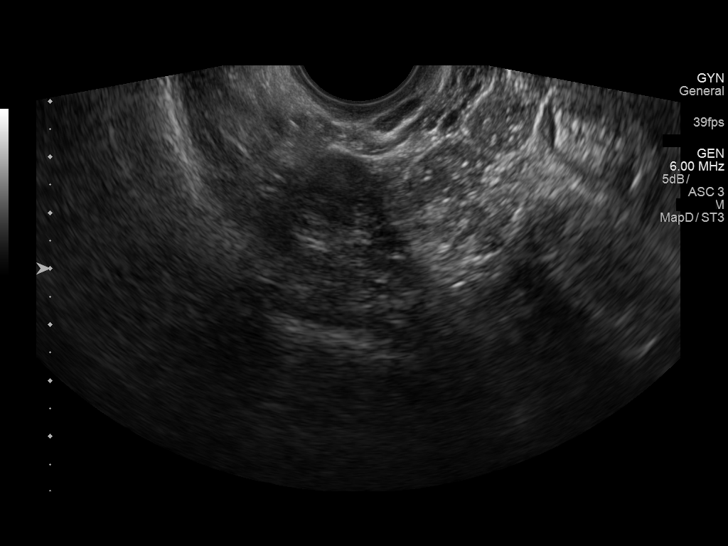
[im 63/84]
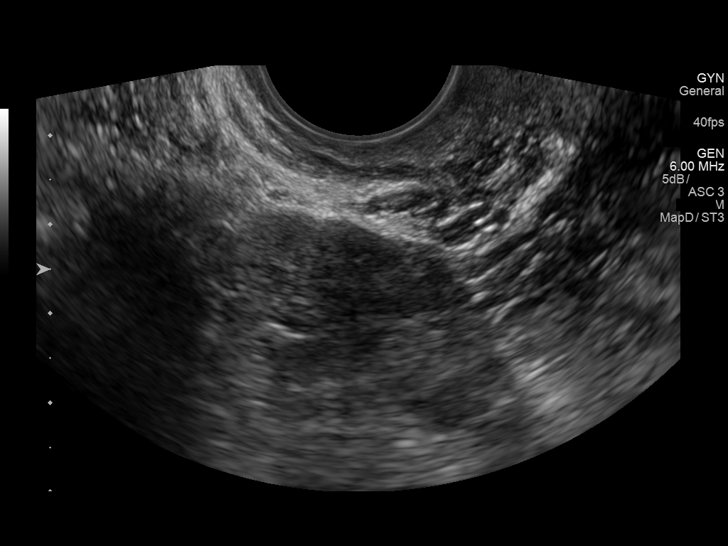
[im 70/84]
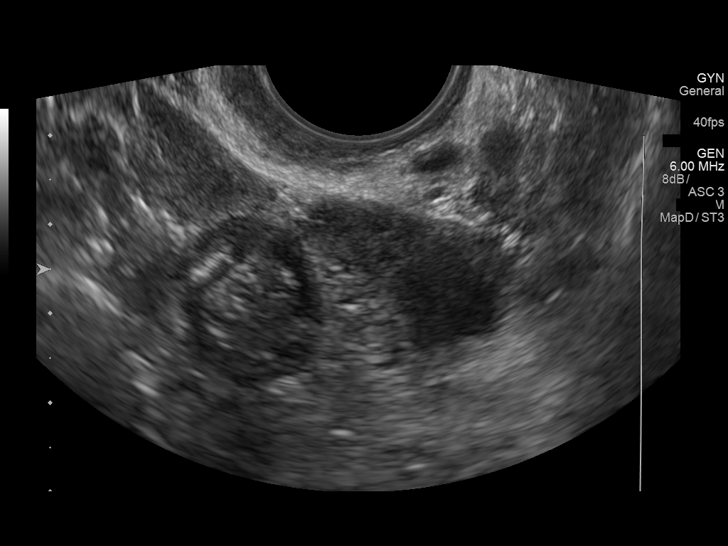
[im 77/84]
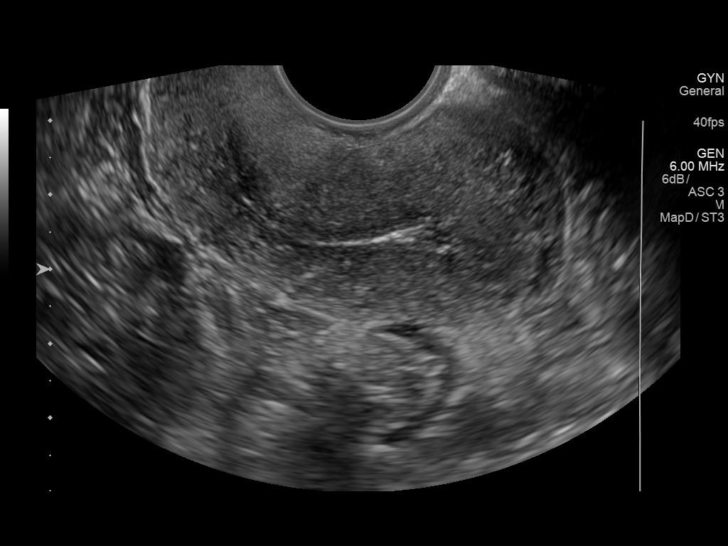
[im 84/84]
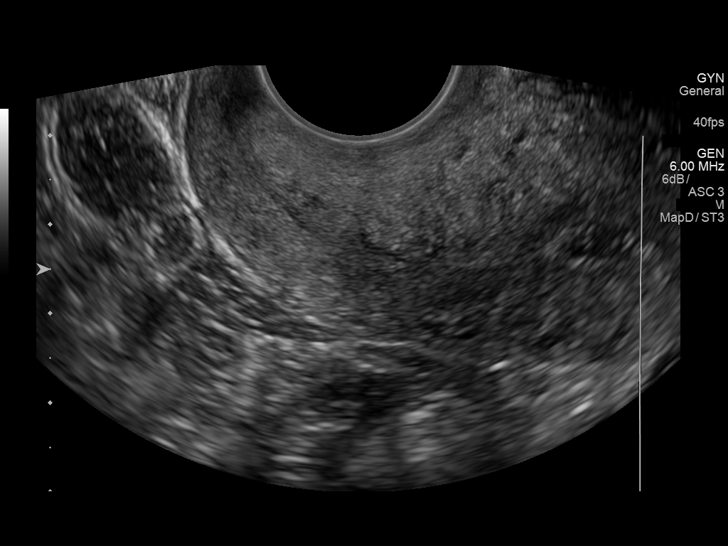

[14 of 25 positions shown; findings below may reference images not displayed]

FINDINGS: Uterus

Measurements: Retroverted, 5.5 x 3.4 x 4.4 cm. No fibroids or other
mass visualized.

Endometrium

Thickness: 3 mm in thickness.  No focal abnormality visualized.

Right ovary

Measurements: 3.0 x 2.4 x 1.8 cm. Normal appearance/no adnexal mass.

Left ovary

Measurements: 2.6 x 1.2 x 1.5 cm. Normal appearance/no adnexal mass.

Other findings:  No abnormal free fluid
IMPRESSION: Unremarkable transvaginal pelvic ultrasound.

## 2019-10-25 IMAGING — DX PORTABLE CHEST - 1 VIEW
1 series · 1 of 1 positions shown · non-contrast
Comparison: Portable chest 11/20/2018 and earlier.

CLINICAL DATA: 27-year-old female with sickle cell pain crisis. Mid
chest pain and shortness of breath.

EXAM:
PORTABLE CHEST 1 VIEW

[chest ap]
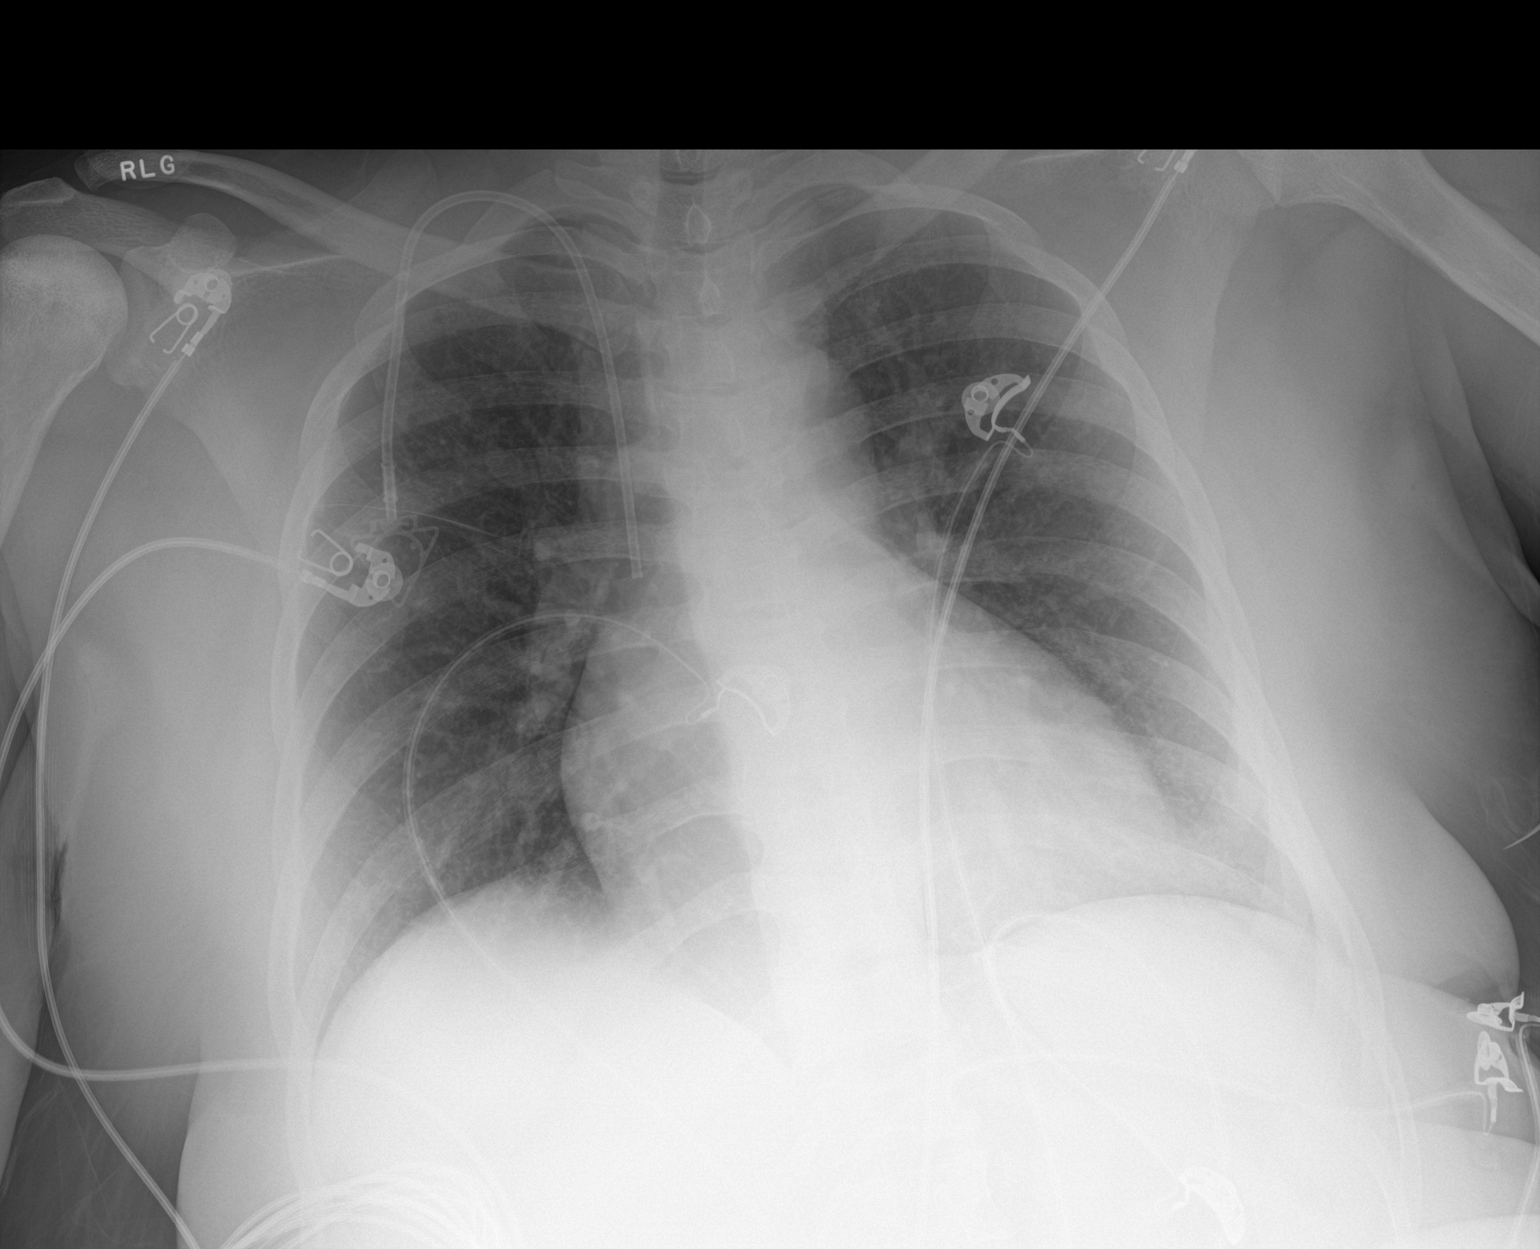

[1 of 1 positions shown; findings below may reference images not displayed]

FINDINGS: Portable AP semi upright view at 7179 hours. Stable right chest
power port, no longer accessed. Stable lung volumes and mediastinal
contours. Allowing for portable technique the lungs are clear.
Visualized tracheal air column is within normal limits. Borderline
to mild cardiomegaly.

Paucity bowel gas in the upper abdomen. No acute osseous abnormality
identified.
IMPRESSION: No acute cardiopulmonary abnormality.

## 2019-10-25 IMAGING — CT CT ANGIOGRAPHY CHEST
2 of 6 series · 18 of 46 positions shown · IV contrast (ISOVUE)
Comparison: 02/27/2017

CLINICAL DATA: PE suspected

EXAM:
CT ANGIOGRAPHY CHEST WITH CONTRAST
TECHNIQUE: Multidetector CT imaging of the chest was performed using the
standard protocol during bolus administration of intravenous
contrast. Multiplanar CT image reconstructions and MIPs were
obtained to evaluate the vascular anatomy.
CONTRAST:  100mL OMNIPAQUE IOHEXOL 350 MG/ML SOLN

[Series 7: thins · axial · 0.77mm/px · z∈[-364,-145]mm · 15 of 241 slices shown]
[im 11/241  lung]
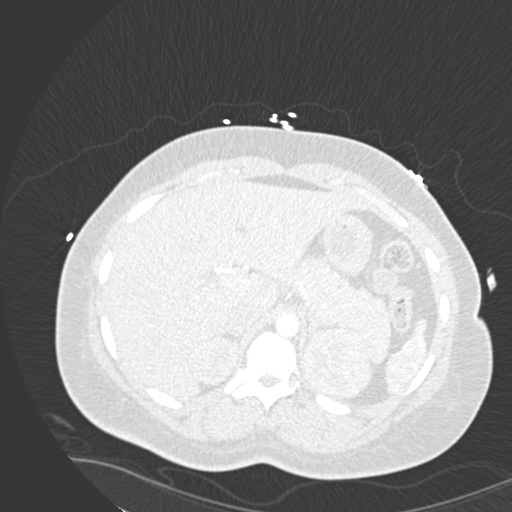
[im 32/241  soft-tissue]
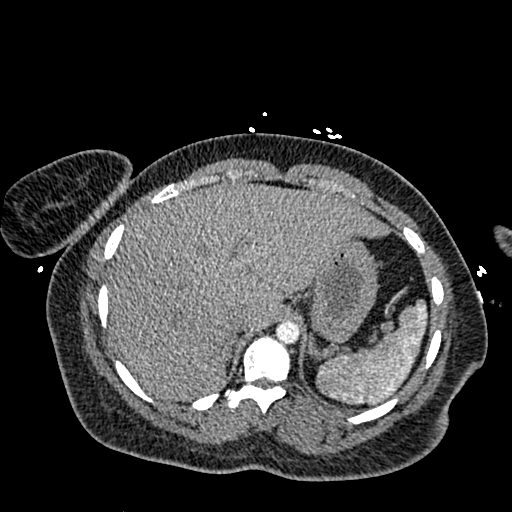
[im 42/241  lung]
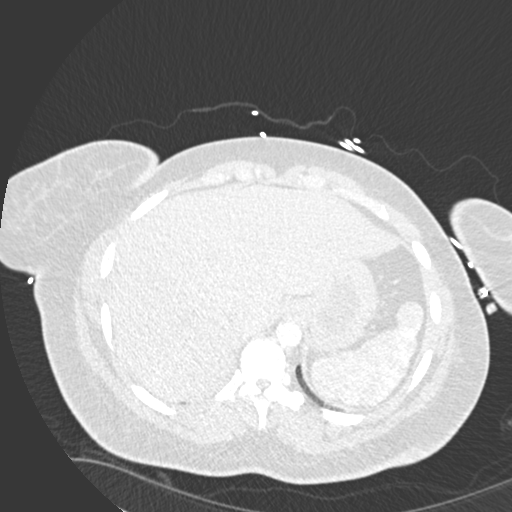
[im 63/241  soft-tissue]
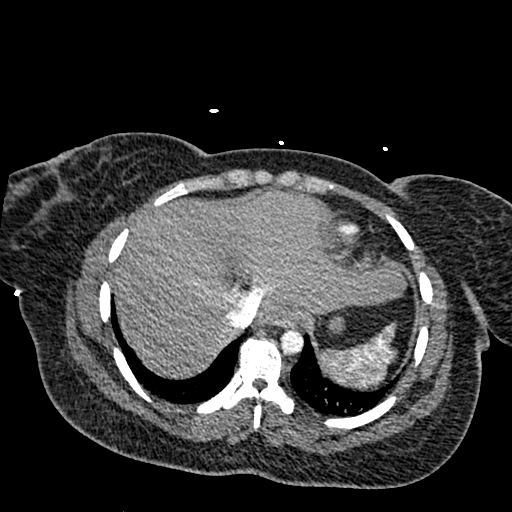
[im 74/241  lung]
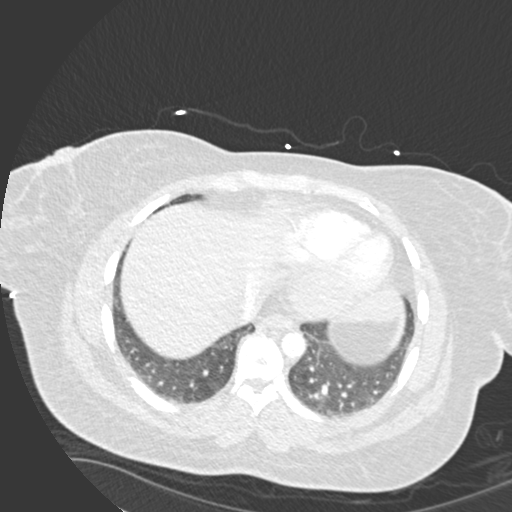
[im 94/241  soft-tissue]
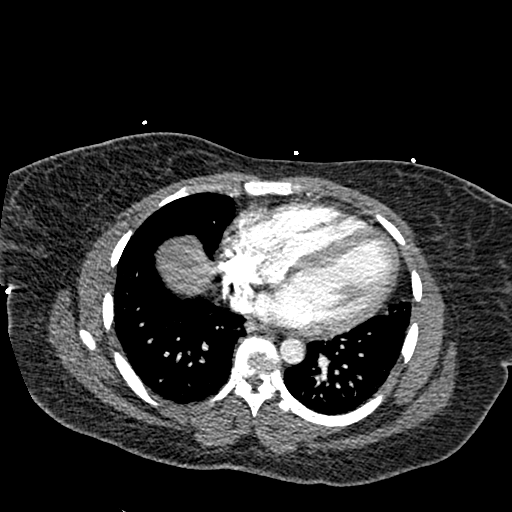
[im 105/241  lung]
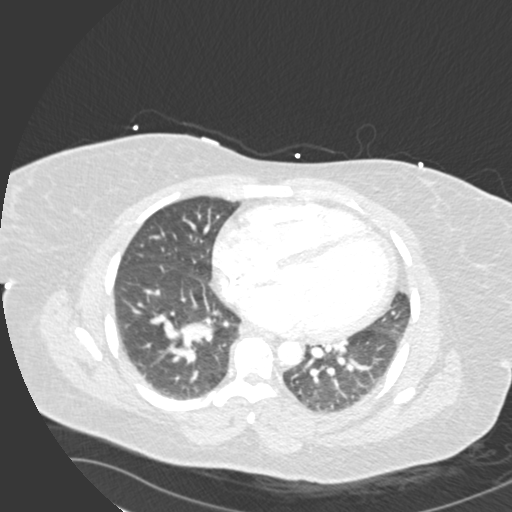
[im 126/241  soft-tissue]
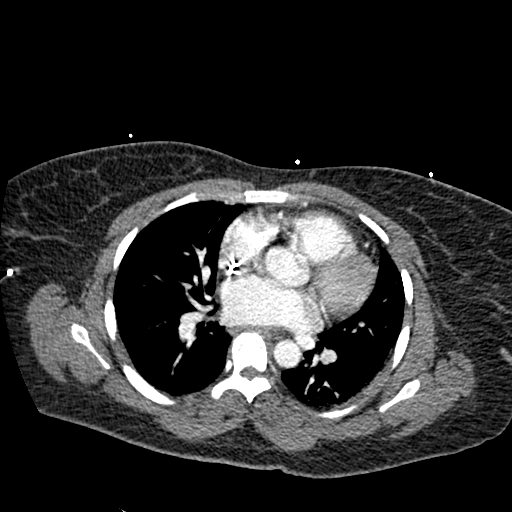
[im 136/241  lung]
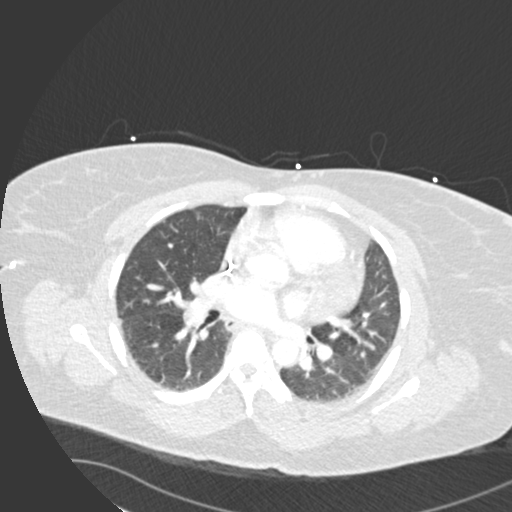
[im 147/241  soft-tissue]
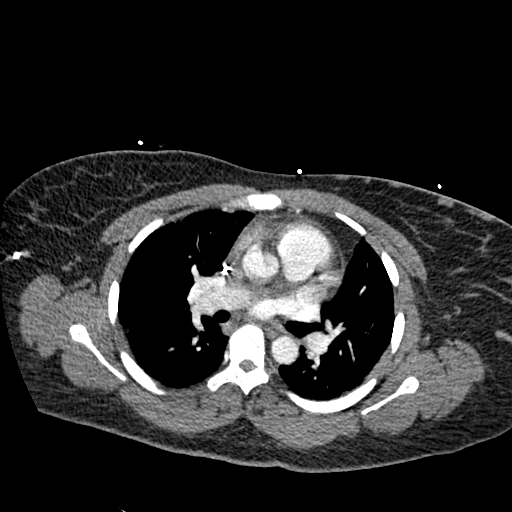
[im 167/241  lung]
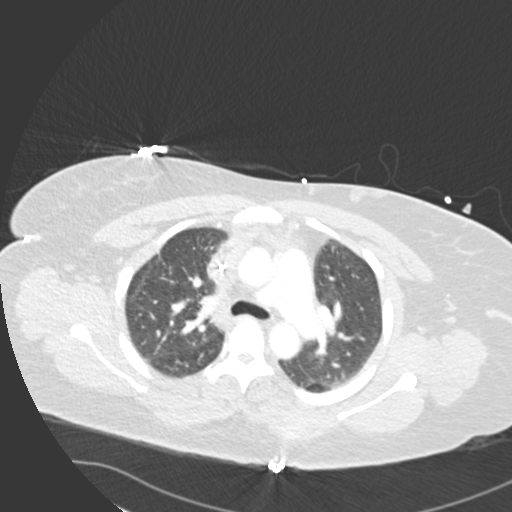
[im 178/241  soft-tissue]
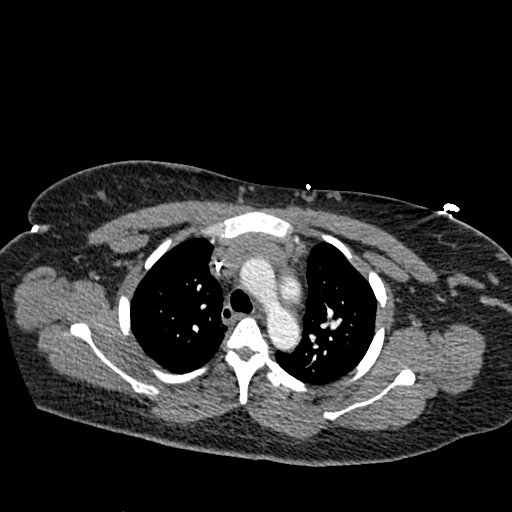
[im 199/241  lung]
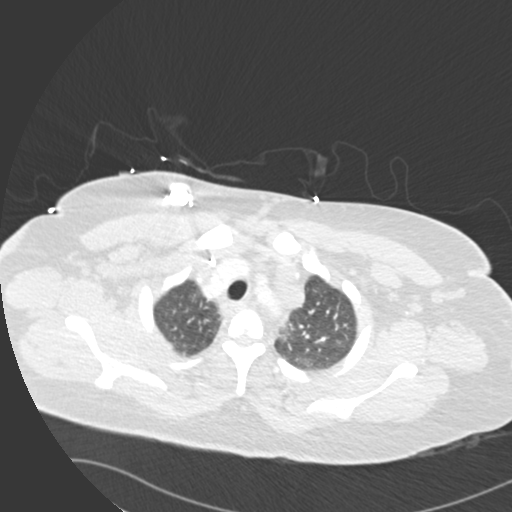
[im 209/241  soft-tissue]
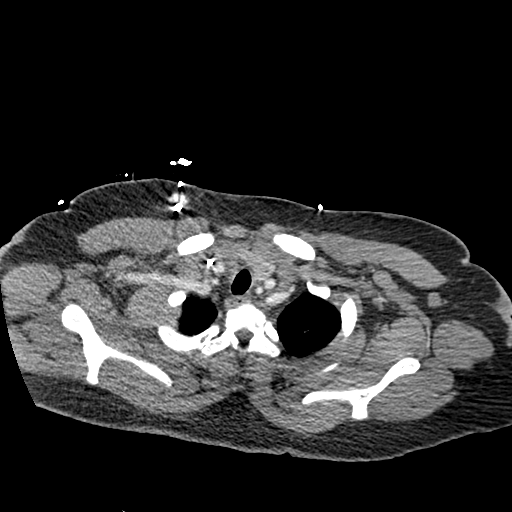
[im 230/241  lung]
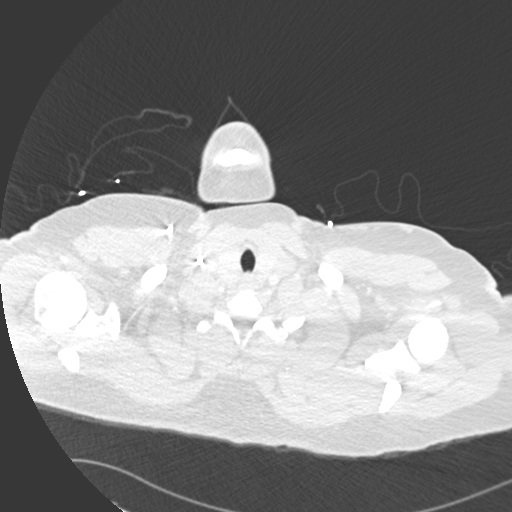

[Series 8: coronal mpr · coronal · 0.48mm/px · 3 of 136 slices shown]
[im 34/136  soft-tissue]
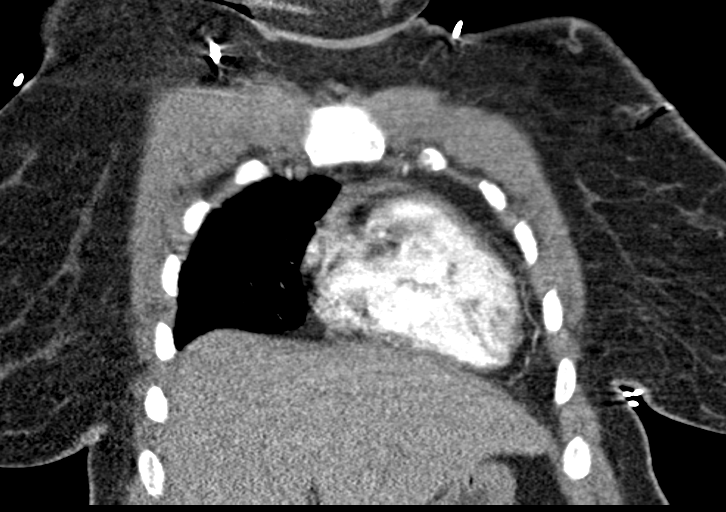
[im 68/136  soft-tissue]
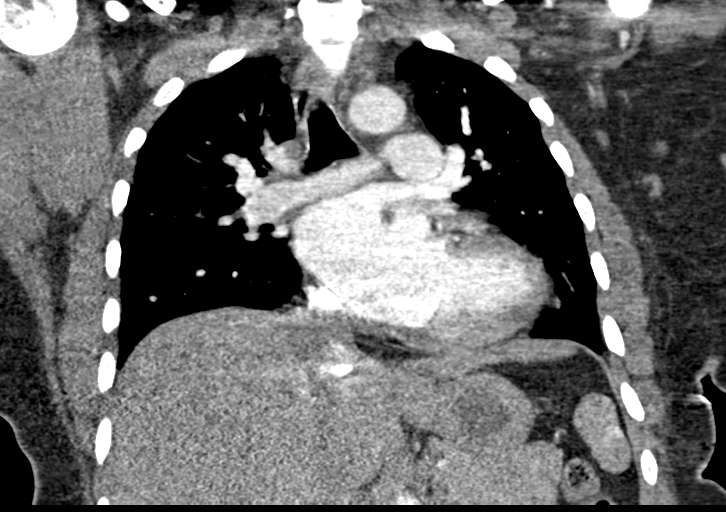
[im 102/136  soft-tissue]
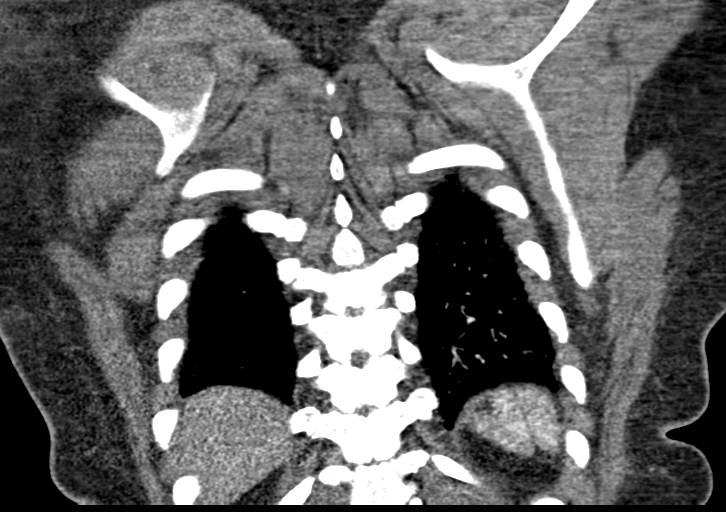

[18 of 46 positions shown; findings below may reference images not displayed]

FINDINGS: Cardiovascular: Satisfactory opacification of the pulmonary arteries
to the segmental level. No evidence of pulmonary embolism. Mild
cardiomegaly. No pericardial effusion. Right chest port catheter.
Venous collaterals about the chest wall.

Mediastinum/Nodes: No enlarged mediastinal, hilar, or axillary lymph
nodes. Thyroid gland, trachea, and esophagus demonstrate no
significant findings.

Lungs/Pleura: Lungs are clear. Stable, benign small pulmonary nodule
of the left lower lobe (series 12, image 56). No pleural effusion or
pneumothorax.

Upper Abdomen: No acute abnormality.  Partial splenic involution.

Musculoskeletal: No chest wall abnormality. H-shaped vertebral body
endplate deformities.

Review of the MIP images confirms the above findings.
IMPRESSION: 1.  Negative examination for pulmonary embolism.

2.  Splenic and osseous stigmata of sickle cell disease.

## 2019-10-27 IMAGING — DX CHEST - 2 VIEW
2 series · 2 of 2 positions shown · non-contrast
Comparison: Chest x-ray 12/10/2018 and lumbar spine 09/29/2017

CLINICAL DATA: Sickle cell patient.  Pain crisis.

EXAM:
CHEST - 2 VIEW

[chest lat]
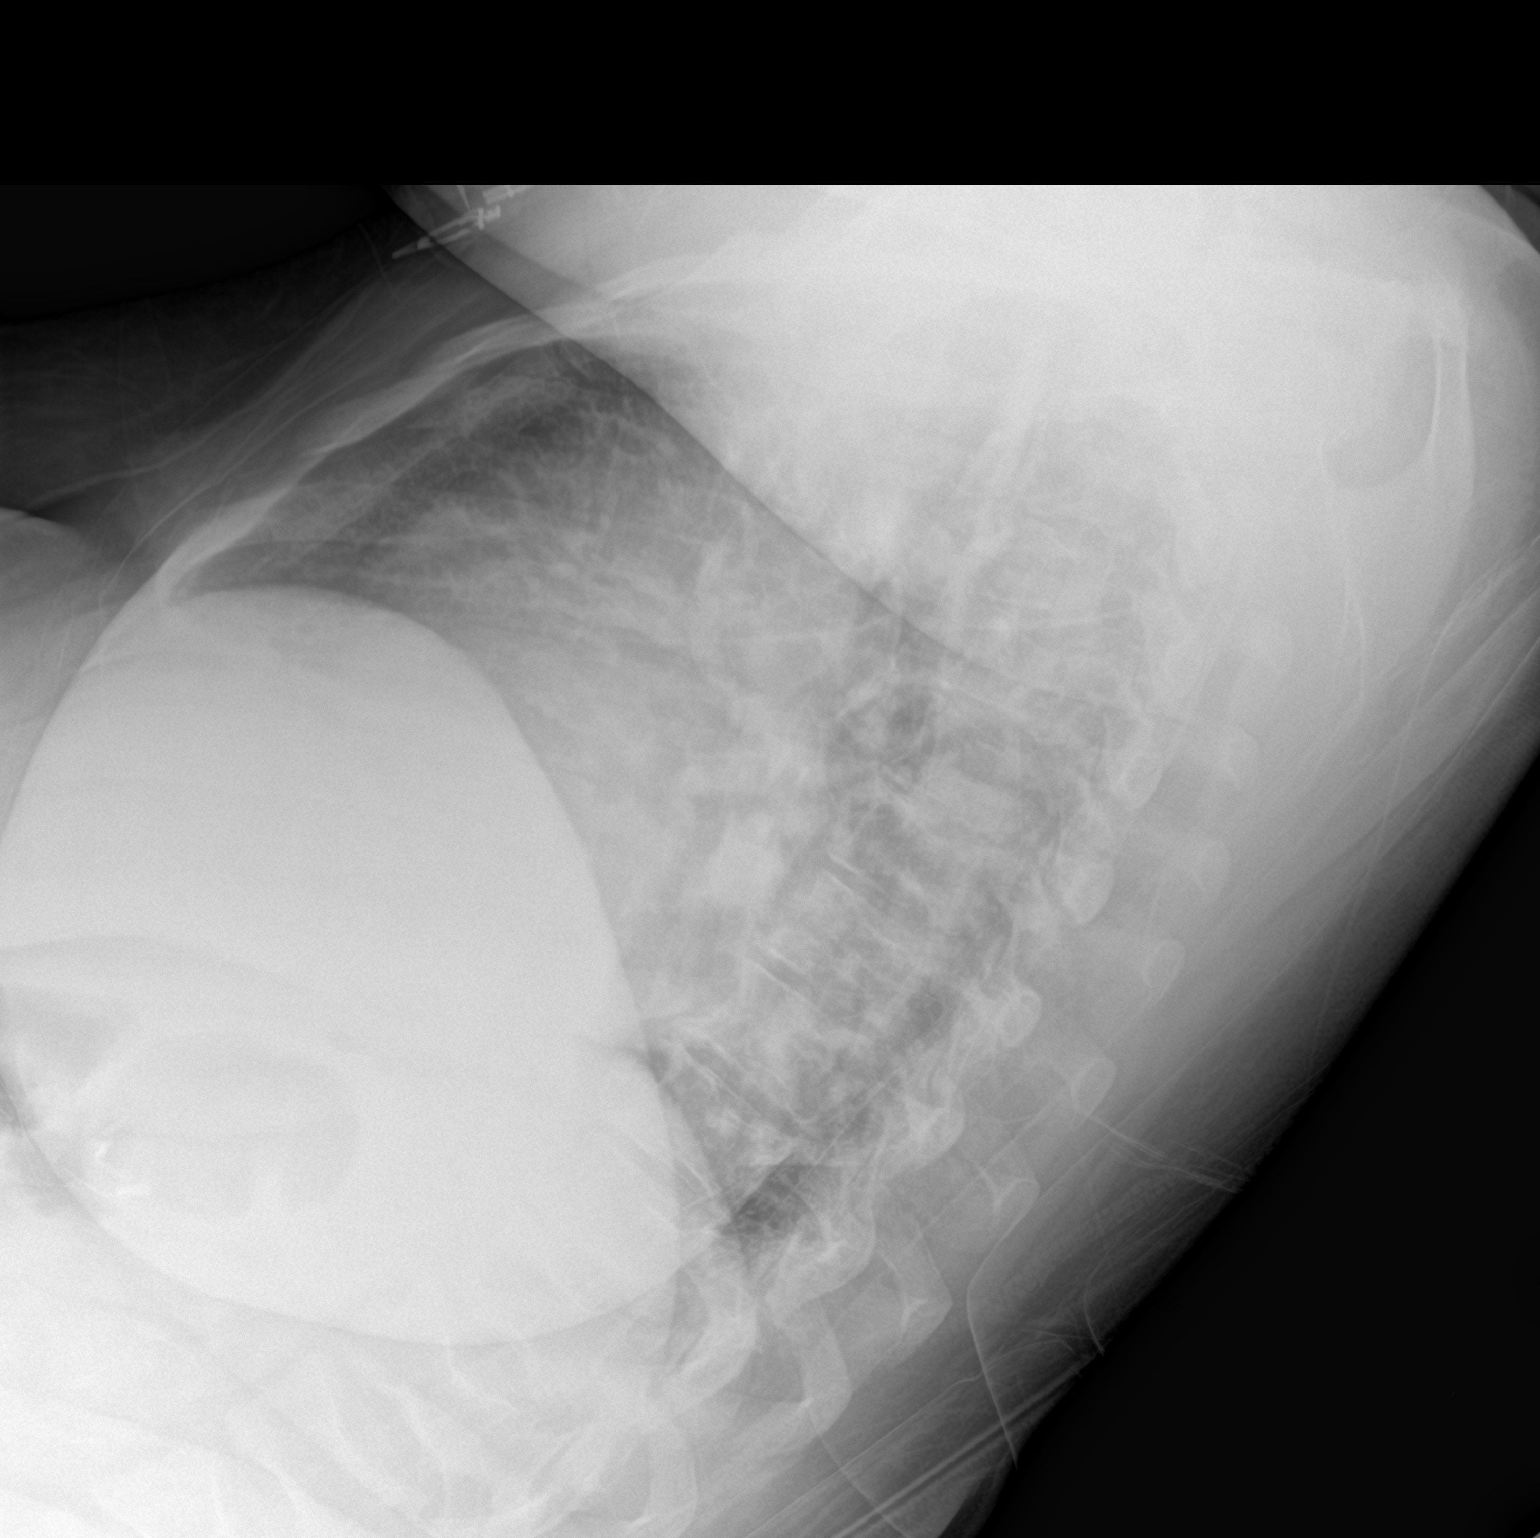

[chest ap]
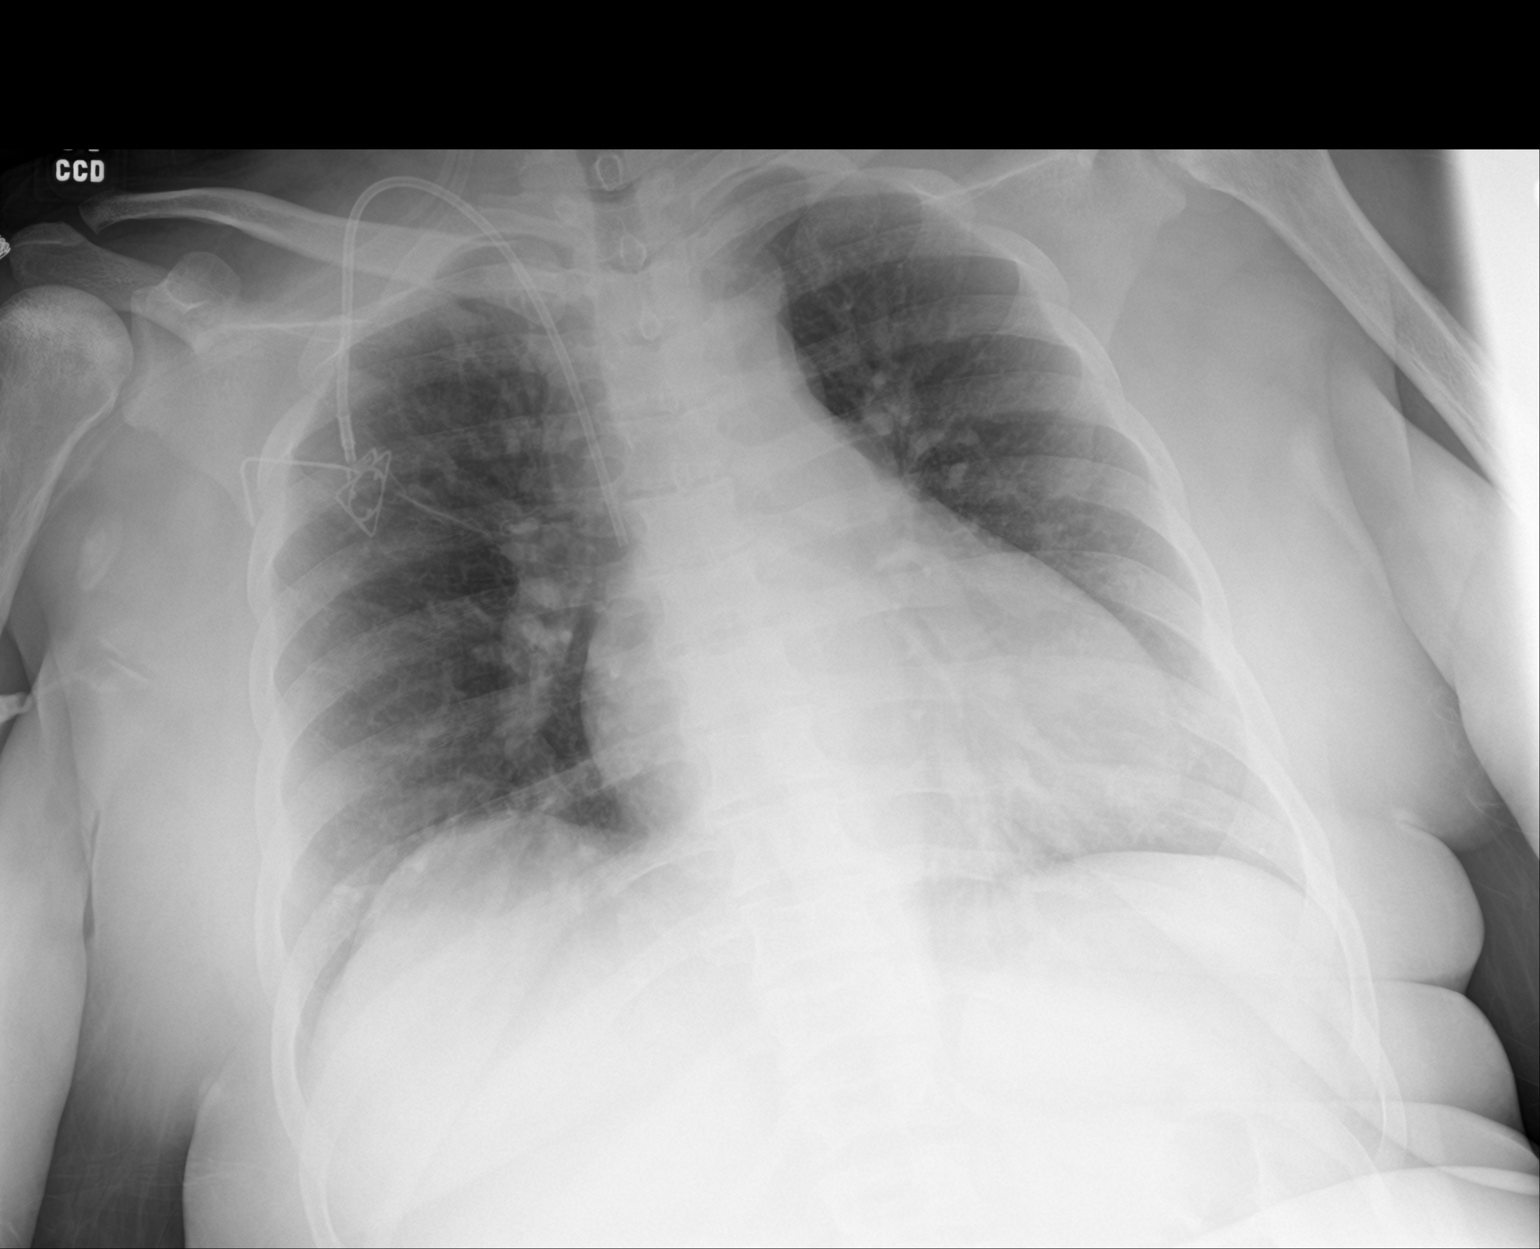

[2 of 2 positions shown; findings below may reference images not displayed]

FINDINGS: Right IJ Port-A-Cath unchanged. Lungs are adequately inflated
without focal airspace consolidation or effusion. Cardiomediastinal
silhouette and remainder of the exam is unchanged.
IMPRESSION: No active cardiopulmonary disease.
